# Patient Record
Sex: Male | Born: 1950
Health system: Southern US, Community
[De-identification: ages and names within clinical notes are randomized; demographics above are authoritative.]

## PROBLEM LIST (undated history)

## (undated) DIAGNOSIS — B1920 Unspecified viral hepatitis C without hepatic coma: Secondary | ICD-10-CM

## (undated) DIAGNOSIS — I639 Cerebral infarction, unspecified: Secondary | ICD-10-CM

## (undated) DIAGNOSIS — K259 Gastric ulcer, unspecified as acute or chronic, without hemorrhage or perforation: Secondary | ICD-10-CM

## (undated) DIAGNOSIS — Z992 Dependence on renal dialysis: Secondary | ICD-10-CM

## (undated) DIAGNOSIS — I5042 Chronic combined systolic (congestive) and diastolic (congestive) heart failure: Secondary | ICD-10-CM

## (undated) DIAGNOSIS — K8020 Calculus of gallbladder without cholecystitis without obstruction: Secondary | ICD-10-CM

## (undated) DIAGNOSIS — K219 Gastro-esophageal reflux disease without esophagitis: Secondary | ICD-10-CM

## (undated) DIAGNOSIS — F528 Other sexual dysfunction not due to a substance or known physiological condition: Secondary | ICD-10-CM

## (undated) DIAGNOSIS — F319 Bipolar disorder, unspecified: Secondary | ICD-10-CM

## (undated) DIAGNOSIS — I447 Left bundle-branch block, unspecified: Secondary | ICD-10-CM

## (undated) DIAGNOSIS — D649 Anemia, unspecified: Secondary | ICD-10-CM

## (undated) DIAGNOSIS — I480 Paroxysmal atrial fibrillation: Secondary | ICD-10-CM

## (undated) DIAGNOSIS — N186 End stage renal disease: Secondary | ICD-10-CM

## (undated) DIAGNOSIS — E119 Type 2 diabetes mellitus without complications: Secondary | ICD-10-CM

## (undated) DIAGNOSIS — F3289 Other specified depressive episodes: Secondary | ICD-10-CM

## (undated) DIAGNOSIS — F329 Major depressive disorder, single episode, unspecified: Secondary | ICD-10-CM

## (undated) DIAGNOSIS — T8859XA Other complications of anesthesia, initial encounter: Secondary | ICD-10-CM

## (undated) DIAGNOSIS — K922 Gastrointestinal hemorrhage, unspecified: Secondary | ICD-10-CM

## (undated) DIAGNOSIS — Z87442 Personal history of urinary calculi: Secondary | ICD-10-CM

## (undated) DIAGNOSIS — T4145XA Adverse effect of unspecified anesthetic, initial encounter: Secondary | ICD-10-CM

## (undated) DIAGNOSIS — I10 Essential (primary) hypertension: Secondary | ICD-10-CM

## (undated) DIAGNOSIS — N400 Enlarged prostate without lower urinary tract symptoms: Secondary | ICD-10-CM

## (undated) DIAGNOSIS — IMO0001 Reserved for inherently not codable concepts without codable children: Secondary | ICD-10-CM

## (undated) HISTORY — PX: EYE SURGERY: SHX253

## (undated) HISTORY — DX: Morbid (severe) obesity due to excess calories: E66.01

## (undated) HISTORY — DX: Other specified depressive episodes: F32.89

## (undated) HISTORY — DX: Essential (primary) hypertension: I10

## (undated) HISTORY — DX: Benign prostatic hyperplasia without lower urinary tract symptoms: N40.0

## (undated) HISTORY — DX: Anemia, unspecified: D64.9

## (undated) HISTORY — DX: Paroxysmal atrial fibrillation: I48.0

## (undated) HISTORY — DX: Gastric ulcer, unspecified as acute or chronic, without hemorrhage or perforation: K25.9

## (undated) HISTORY — DX: Type 2 diabetes mellitus without complications: E11.9

## (undated) HISTORY — DX: Other sexual dysfunction not due to a substance or known physiological condition: F52.8

## (undated) HISTORY — DX: Chronic combined systolic (congestive) and diastolic (congestive) heart failure: I50.42

## (undated) HISTORY — DX: Calculus of gallbladder without cholecystitis without obstruction: K80.20

## (undated) HISTORY — DX: Gastro-esophageal reflux disease without esophagitis: K21.9

## (undated) HISTORY — DX: Left bundle-branch block, unspecified: I44.7

## (undated) HISTORY — DX: Major depressive disorder, single episode, unspecified: F32.9

## (undated) HISTORY — DX: Reserved for inherently not codable concepts without codable children: IMO0001

---

## 1998-01-15 ENCOUNTER — Encounter: Admission: RE | Admit: 1998-01-15 | Discharge: 1998-04-15 | Payer: Self-pay | Admitting: Anesthesiology

## 1998-01-17 ENCOUNTER — Ambulatory Visit (HOSPITAL_COMMUNITY): Admission: RE | Admit: 1998-01-17 | Discharge: 1998-01-17 | Payer: Self-pay | Admitting: Gastroenterology

## 1998-04-26 ENCOUNTER — Encounter: Admission: RE | Admit: 1998-04-26 | Discharge: 1998-07-25 | Payer: Self-pay | Admitting: Anesthesiology

## 1998-09-25 ENCOUNTER — Encounter: Admission: RE | Admit: 1998-09-25 | Discharge: 1998-12-13 | Payer: Self-pay | Admitting: Anesthesiology

## 1998-12-13 ENCOUNTER — Encounter: Admission: RE | Admit: 1998-12-13 | Discharge: 1999-02-24 | Payer: Self-pay | Admitting: Anesthesiology

## 1999-02-25 ENCOUNTER — Encounter: Admission: RE | Admit: 1999-02-25 | Discharge: 1999-05-26 | Payer: Self-pay | Admitting: Anesthesiology

## 2001-11-30 ENCOUNTER — Ambulatory Visit: Admission: RE | Admit: 2001-11-30 | Discharge: 2001-11-30 | Payer: Self-pay | Admitting: Internal Medicine

## 2006-03-05 ENCOUNTER — Ambulatory Visit: Payer: Self-pay | Admitting: Internal Medicine

## 2006-04-14 ENCOUNTER — Ambulatory Visit: Payer: Self-pay | Admitting: Internal Medicine

## 2006-04-15 ENCOUNTER — Ambulatory Visit (HOSPITAL_COMMUNITY): Admission: RE | Admit: 2006-04-15 | Discharge: 2006-04-15 | Payer: Self-pay | Admitting: Internal Medicine

## 2006-04-15 ENCOUNTER — Ambulatory Visit: Payer: Self-pay | Admitting: Internal Medicine

## 2006-04-16 ENCOUNTER — Ambulatory Visit: Payer: Self-pay | Admitting: Internal Medicine

## 2006-04-23 ENCOUNTER — Ambulatory Visit (HOSPITAL_COMMUNITY): Admission: RE | Admit: 2006-04-23 | Discharge: 2006-04-23 | Payer: Self-pay | Admitting: Internal Medicine

## 2006-06-10 ENCOUNTER — Ambulatory Visit: Payer: Self-pay | Admitting: Internal Medicine

## 2007-03-25 ENCOUNTER — Encounter: Payer: Self-pay | Admitting: Internal Medicine

## 2007-03-25 DIAGNOSIS — I110 Hypertensive heart disease with heart failure: Secondary | ICD-10-CM | POA: Insufficient documentation

## 2007-03-25 DIAGNOSIS — F528 Other sexual dysfunction not due to a substance or known physiological condition: Secondary | ICD-10-CM

## 2007-03-25 DIAGNOSIS — E119 Type 2 diabetes mellitus without complications: Secondary | ICD-10-CM

## 2007-03-25 DIAGNOSIS — B182 Chronic viral hepatitis C: Secondary | ICD-10-CM

## 2007-03-25 DIAGNOSIS — K219 Gastro-esophageal reflux disease without esophagitis: Secondary | ICD-10-CM | POA: Insufficient documentation

## 2007-05-05 ENCOUNTER — Encounter: Payer: Self-pay | Admitting: Internal Medicine

## 2007-05-05 ENCOUNTER — Ambulatory Visit: Payer: Self-pay | Admitting: Internal Medicine

## 2007-05-05 DIAGNOSIS — J309 Allergic rhinitis, unspecified: Secondary | ICD-10-CM | POA: Insufficient documentation

## 2007-05-05 DIAGNOSIS — R1084 Generalized abdominal pain: Secondary | ICD-10-CM

## 2007-05-05 LAB — CONVERTED CEMR LAB
ALT: 41 units/L (ref 0–53)
Amylase: 97 units/L (ref 27–131)
BUN: 18 mg/dL (ref 6–23)
Bilirubin, Direct: 0.3 mg/dL (ref 0.0–0.3)
Calcium: 9.4 mg/dL (ref 8.4–10.5)
Eosinophils Absolute: 0 10*3/uL (ref 0.0–0.6)
Eosinophils Relative: 0.7 % (ref 0.0–5.0)
GFR calc Af Amer: 58 mL/min
GFR calc non Af Amer: 48 mL/min
Glucose, Bld: 260 mg/dL — ABNORMAL HIGH (ref 70–99)
H Pylori IgG: NEGATIVE
Hgb A1c MFr Bld: 8.6 % — ABNORMAL HIGH (ref 4.6–6.0)
Lipase: 20 units/L (ref 11.0–59.0)
Lymphocytes Relative: 24.1 % (ref 12.0–46.0)
MCV: 86.9 fL (ref 78.0–100.0)
Monocytes Relative: 6.6 % (ref 3.0–11.0)
Neutro Abs: 3.6 10*3/uL (ref 1.4–7.7)
Platelets: 197 10*3/uL (ref 150–400)
Potassium: 4.9 meq/L (ref 3.5–5.1)

## 2007-05-13 ENCOUNTER — Encounter: Admission: RE | Admit: 2007-05-13 | Discharge: 2007-05-13 | Payer: Self-pay | Admitting: Internal Medicine

## 2007-07-28 DIAGNOSIS — I639 Cerebral infarction, unspecified: Secondary | ICD-10-CM

## 2007-07-28 HISTORY — DX: Cerebral infarction, unspecified: I63.9

## 2007-08-23 ENCOUNTER — Inpatient Hospital Stay (HOSPITAL_COMMUNITY): Admission: EM | Admit: 2007-08-23 | Discharge: 2007-09-05 | Payer: Self-pay | Admitting: Emergency Medicine

## 2007-08-23 ENCOUNTER — Ambulatory Visit: Payer: Self-pay | Admitting: Cardiovascular Disease

## 2007-08-23 ENCOUNTER — Ambulatory Visit: Payer: Self-pay | Admitting: Internal Medicine

## 2007-08-23 ENCOUNTER — Encounter: Payer: Self-pay | Admitting: Emergency Medicine

## 2007-08-25 ENCOUNTER — Ambulatory Visit: Payer: Self-pay | Admitting: Cardiovascular Disease

## 2007-08-25 ENCOUNTER — Encounter: Payer: Self-pay | Admitting: Internal Medicine

## 2007-08-29 ENCOUNTER — Ambulatory Visit: Payer: Self-pay | Admitting: Physical Medicine & Rehabilitation

## 2007-09-05 ENCOUNTER — Ambulatory Visit: Payer: Self-pay | Admitting: Physical Medicine & Rehabilitation

## 2007-09-05 ENCOUNTER — Inpatient Hospital Stay (HOSPITAL_COMMUNITY)
Admission: RE | Admit: 2007-09-05 | Discharge: 2007-09-28 | Payer: Self-pay | Admitting: Physical Medicine & Rehabilitation

## 2007-09-08 ENCOUNTER — Telehealth (INDEPENDENT_AMBULATORY_CARE_PROVIDER_SITE_OTHER): Payer: Self-pay | Admitting: *Deleted

## 2007-10-17 ENCOUNTER — Encounter
Admission: RE | Admit: 2007-10-17 | Discharge: 2008-01-15 | Payer: Self-pay | Admitting: Physical Medicine & Rehabilitation

## 2007-11-23 ENCOUNTER — Ambulatory Visit: Payer: Self-pay | Admitting: Physical Medicine & Rehabilitation

## 2007-12-06 ENCOUNTER — Encounter
Admission: RE | Admit: 2007-12-06 | Discharge: 2008-03-08 | Payer: Self-pay | Admitting: Physical Medicine & Rehabilitation

## 2008-02-14 ENCOUNTER — Encounter
Admission: RE | Admit: 2008-02-14 | Discharge: 2008-03-07 | Payer: Self-pay | Admitting: Physical Medicine & Rehabilitation

## 2009-03-15 ENCOUNTER — Telehealth: Payer: Self-pay | Admitting: Internal Medicine

## 2009-03-18 ENCOUNTER — Ambulatory Visit: Payer: Self-pay | Admitting: Internal Medicine

## 2009-03-18 DIAGNOSIS — Z87442 Personal history of urinary calculi: Secondary | ICD-10-CM | POA: Insufficient documentation

## 2009-03-18 DIAGNOSIS — I5041 Acute combined systolic (congestive) and diastolic (congestive) heart failure: Secondary | ICD-10-CM

## 2009-03-18 DIAGNOSIS — F329 Major depressive disorder, single episode, unspecified: Secondary | ICD-10-CM

## 2009-03-19 ENCOUNTER — Telehealth: Payer: Self-pay | Admitting: Internal Medicine

## 2009-03-20 LAB — CONVERTED CEMR LAB
BUN: 43 mg/dL — ABNORMAL HIGH (ref 6–23)
Calcium: 8.9 mg/dL (ref 8.4–10.5)
Creatinine, Ser: 1.9 mg/dL — ABNORMAL HIGH (ref 0.4–1.5)
GFR calc non Af Amer: 47.13 mL/min (ref >60)
Hgb A1c MFr Bld: 9.2 % — ABNORMAL HIGH (ref 4.6–6.5)
Potassium: 4.6 meq/L (ref 3.5–5.1)

## 2009-03-21 ENCOUNTER — Telehealth: Payer: Self-pay | Admitting: Internal Medicine

## 2009-03-27 ENCOUNTER — Telehealth: Payer: Self-pay | Admitting: Internal Medicine

## 2009-08-06 ENCOUNTER — Ambulatory Visit: Payer: Self-pay | Admitting: Internal Medicine

## 2009-08-06 DIAGNOSIS — N259 Disorder resulting from impaired renal tubular function, unspecified: Secondary | ICD-10-CM | POA: Insufficient documentation

## 2009-08-06 DIAGNOSIS — Z8679 Personal history of other diseases of the circulatory system: Secondary | ICD-10-CM

## 2009-08-06 DIAGNOSIS — M545 Low back pain: Secondary | ICD-10-CM

## 2009-08-07 LAB — CONVERTED CEMR LAB
ALT: 54 units/L — ABNORMAL HIGH (ref 0–53)
BUN: 24 mg/dL — ABNORMAL HIGH (ref 6–23)
Bilirubin, Direct: 0.1 mg/dL (ref 0.0–0.3)
CO2: 27 meq/L (ref 19–32)
Chloride: 101 meq/L (ref 96–112)
Cholesterol: 176 mg/dL (ref 0–200)
Creatinine, Ser: 1.8 mg/dL — ABNORMAL HIGH (ref 0.4–1.5)
Eosinophils Absolute: 0.1 10*3/uL (ref 0.0–0.7)
Glucose, Bld: 288 mg/dL — ABNORMAL HIGH (ref 70–99)
HCT: 41 % (ref 39.0–52.0)
HDL: 48.4 mg/dL (ref >39.00)
Hemoglobin, Urine: NEGATIVE
LDL Cholesterol: 105 mg/dL — ABNORMAL HIGH (ref 0–99)
Lymphs Abs: 1.7 10*3/uL (ref 0.7–4.0)
MCHC: 31.7 g/dL (ref 30.0–36.0)
MCV: 88.3 fL (ref 78.0–100.0)
Monocytes Absolute: 0.5 10*3/uL (ref 0.1–1.0)
Neutrophils Relative %: 52.2 % (ref 43.0–77.0)
Nitrite: NEGATIVE
Platelets: 270 10*3/uL (ref 150.0–400.0)
Potassium: 4.5 meq/L (ref 3.5–5.1)
Total Bilirubin: 0.6 mg/dL (ref 0.3–1.2)
Total CHOL/HDL Ratio: 4
Total Protein, Urine: 100 mg/dL
Total Protein: 7.3 g/dL (ref 6.0–8.3)
Triglycerides: 115 mg/dL (ref 0.0–149.0)
Urine Glucose: 100 mg/dL
pH: 6 (ref 5.0–8.0)

## 2009-09-18 ENCOUNTER — Ambulatory Visit: Payer: Self-pay | Admitting: Internal Medicine

## 2009-09-18 DIAGNOSIS — M25519 Pain in unspecified shoulder: Secondary | ICD-10-CM | POA: Insufficient documentation

## 2009-09-20 LAB — CONVERTED CEMR LAB
Calcium: 8.9 mg/dL (ref 8.4–10.5)
Cholesterol: 170 mg/dL (ref 0–200)
GFR calc non Af Amer: 47.05 mL/min (ref >60)
HDL: 49.3 mg/dL (ref >39.00)
Hgb A1c MFr Bld: 7.9 % — ABNORMAL HIGH (ref 4.6–6.5)
Potassium: 4.1 meq/L (ref 3.5–5.1)
Sodium: 137 meq/L (ref 135–145)
Triglycerides: 82 mg/dL (ref 0.0–149.0)
VLDL: 16.4 mg/dL (ref 0.0–40.0)

## 2009-11-06 ENCOUNTER — Ambulatory Visit: Payer: Self-pay | Admitting: Internal Medicine

## 2009-11-06 DIAGNOSIS — N189 Chronic kidney disease, unspecified: Secondary | ICD-10-CM

## 2009-11-06 DIAGNOSIS — R109 Unspecified abdominal pain: Secondary | ICD-10-CM

## 2009-11-06 DIAGNOSIS — R10819 Abdominal tenderness, unspecified site: Secondary | ICD-10-CM

## 2009-11-07 LAB — CONVERTED CEMR LAB
ALT: 100 units/L — ABNORMAL HIGH (ref 0–53)
AST: 69 units/L — ABNORMAL HIGH (ref 0–37)
Alkaline Phosphatase: 79 units/L (ref 39–117)
BUN: 22 mg/dL (ref 6–23)
Basophils Relative: 0.8 % (ref 0.0–3.0)
Bilirubin, Direct: 0.1 mg/dL (ref 0.0–0.3)
Calcium: 8.8 mg/dL (ref 8.4–10.5)
Eosinophils Absolute: 0.1 10*3/uL (ref 0.0–0.7)
GFR calc non Af Amer: 53.47 mL/min (ref >60)
HDL: 45.9 mg/dL (ref >39.00)
Hemoglobin, Urine: NEGATIVE
Hgb A1c MFr Bld: 9.4 % — ABNORMAL HIGH (ref 4.6–6.5)
Lipase: 17 units/L (ref 11.0–59.0)
Lymphs Abs: 2 10*3/uL (ref 0.7–4.0)
MCHC: 33.5 g/dL (ref 30.0–36.0)
MCV: 84.5 fL (ref 78.0–100.0)
Monocytes Absolute: 0.5 10*3/uL (ref 0.1–1.0)
Neutrophils Relative %: 55.5 % (ref 43.0–77.0)
Nitrite: NEGATIVE
Platelets: 251 10*3/uL (ref 150.0–400.0)
Potassium: 4 meq/L (ref 3.5–5.1)
RBC: 5.1 M/uL (ref 4.22–5.81)
Specific Gravity, Urine: >=1.03 (ref 1.000–1.030)
Total Bilirubin: 0.3 mg/dL (ref 0.3–1.2)
Total Protein, Urine: >=300 mg/dL
Urobilinogen, UA: 0.2 (ref 0.0–1.0)
pH: 5 (ref 5.0–8.0)

## 2009-11-08 ENCOUNTER — Encounter: Admission: RE | Admit: 2009-11-08 | Discharge: 2009-11-08 | Payer: Self-pay | Admitting: Internal Medicine

## 2009-11-08 ENCOUNTER — Ambulatory Visit: Payer: Self-pay | Admitting: Internal Medicine

## 2009-11-08 LAB — CONVERTED CEMR LAB
HCV Ab: REACTIVE — AB
Hep A IgM: NEGATIVE

## 2010-04-01 ENCOUNTER — Encounter: Payer: Self-pay | Admitting: Internal Medicine

## 2010-04-23 ENCOUNTER — Telehealth: Payer: Self-pay | Admitting: Internal Medicine

## 2010-04-23 ENCOUNTER — Ambulatory Visit: Payer: Self-pay | Admitting: Internal Medicine

## 2010-04-23 DIAGNOSIS — M79609 Pain in unspecified limb: Secondary | ICD-10-CM | POA: Insufficient documentation

## 2010-04-23 DIAGNOSIS — R5383 Other fatigue: Secondary | ICD-10-CM

## 2010-04-23 DIAGNOSIS — R5381 Other malaise: Secondary | ICD-10-CM | POA: Insufficient documentation

## 2010-04-23 DIAGNOSIS — R935 Abnormal findings on diagnostic imaging of other abdominal regions, including retroperitoneum: Secondary | ICD-10-CM | POA: Insufficient documentation

## 2010-04-23 LAB — CONVERTED CEMR LAB
ALT: 39 units/L (ref 0–53)
Albumin: 3.1 g/dL — ABNORMAL LOW (ref 3.5–5.2)
Basophils Absolute: 0 10*3/uL (ref 0.0–0.1)
Basophils Relative: 0.5 % (ref 0.0–3.0)
CO2: 26 meq/L (ref 19–32)
Chloride: 103 meq/L (ref 96–112)
Cholesterol: 178 mg/dL (ref 0–200)
Eosinophils Absolute: 0.1 10*3/uL (ref 0.0–0.7)
HDL: 42 mg/dL (ref >39.00)
Hemoglobin: 13.4 g/dL (ref 13.0–17.0)
LDL Cholesterol: 114 mg/dL — ABNORMAL HIGH (ref 0–99)
Lymphocytes Relative: 29.8 % (ref 12.0–46.0)
MCHC: 33.4 g/dL (ref 30.0–36.0)
Monocytes Relative: 8.1 % (ref 3.0–12.0)
Neutro Abs: 3.6 10*3/uL (ref 1.4–7.7)
Neutrophils Relative %: 59.6 % (ref 43.0–77.0)
Potassium: 5 meq/L (ref 3.5–5.1)
RBC: 4.66 M/uL (ref 4.22–5.81)
RDW: 13.1 % (ref 11.5–14.6)
Sodium: 136 meq/L (ref 135–145)
Total CHOL/HDL Ratio: 4
Total Protein: 7.2 g/dL (ref 6.0–8.3)
Triglycerides: 108 mg/dL (ref 0.0–149.0)

## 2010-04-24 ENCOUNTER — Ambulatory Visit: Payer: Self-pay | Admitting: Internal Medicine

## 2010-04-29 ENCOUNTER — Encounter: Payer: Self-pay | Admitting: Internal Medicine

## 2010-05-02 ENCOUNTER — Telehealth: Payer: Self-pay | Admitting: Internal Medicine

## 2010-05-29 ENCOUNTER — Encounter: Payer: Self-pay | Admitting: Internal Medicine

## 2010-06-27 ENCOUNTER — Encounter: Payer: Self-pay | Admitting: Internal Medicine

## 2010-07-31 ENCOUNTER — Telehealth: Payer: Self-pay | Admitting: Internal Medicine

## 2010-08-01 ENCOUNTER — Ambulatory Visit
Admission: RE | Admit: 2010-08-01 | Discharge: 2010-08-01 | Payer: Self-pay | Source: Home / Self Care | Attending: Internal Medicine | Admitting: Internal Medicine

## 2010-08-01 ENCOUNTER — Other Ambulatory Visit: Payer: Self-pay | Admitting: Internal Medicine

## 2010-08-01 ENCOUNTER — Encounter: Payer: Self-pay | Admitting: Internal Medicine

## 2010-08-01 DIAGNOSIS — R609 Edema, unspecified: Secondary | ICD-10-CM | POA: Insufficient documentation

## 2010-08-01 DIAGNOSIS — N400 Enlarged prostate without lower urinary tract symptoms: Secondary | ICD-10-CM | POA: Insufficient documentation

## 2010-08-01 DIAGNOSIS — K8020 Calculus of gallbladder without cholecystitis without obstruction: Secondary | ICD-10-CM | POA: Insufficient documentation

## 2010-08-01 LAB — LIPID PANEL
Cholesterol: 160 mg/dL (ref 0–200)
HDL: 35.3 mg/dL — ABNORMAL LOW (ref >39.00)
LDL Cholesterol: 106 mg/dL — ABNORMAL HIGH (ref 0–99)
Total CHOL/HDL Ratio: 5
Triglycerides: 92 mg/dL (ref 0.0–149.0)
VLDL: 18.4 mg/dL (ref 0.0–40.0)

## 2010-08-01 LAB — BASIC METABOLIC PANEL
BUN: 38 mg/dL — ABNORMAL HIGH (ref 6–23)
CO2: 26 mEq/L (ref 19–32)
Calcium: 8.8 mg/dL (ref 8.4–10.5)
Chloride: 105 mEq/L (ref 96–112)
Creatinine, Ser: 3 mg/dL — ABNORMAL HIGH (ref 0.4–1.5)
GFR: 28.23 mL/min — ABNORMAL LOW (ref >60.00)
Glucose, Bld: 257 mg/dL — ABNORMAL HIGH (ref 70–99)
Potassium: 4.5 mEq/L (ref 3.5–5.1)
Sodium: 138 mEq/L (ref 135–145)

## 2010-08-01 LAB — HEMOGLOBIN A1C: Hgb A1c MFr Bld: 8.5 % — ABNORMAL HIGH (ref 4.6–6.5)

## 2010-08-15 ENCOUNTER — Encounter: Payer: Self-pay | Admitting: Internal Medicine

## 2010-08-28 NOTE — Progress Notes (Signed)
Summary: pt decline appt for referal   Phone Note From Other Clinic   Caller: Appointment Secretary lynn  Call For: Dr Jonny Ruiz Details for Reason: FYI  Summary of Call: Per Larita Fife at [facility] called pt to today to schedule Appt -pt decline appt  Initial call taken by: Shelbie Proctor,  May 02, 2010 4:52 PM  Follow-up for Phone Call        noted Follow-up by: Corwin Levins MD,  May 02, 2010 5:26 PM

## 2010-08-28 NOTE — Consult Note (Signed)
Summary: [facility]   Imported By: Lester [place] 06/03/2010 07:37:07  _____________________________________________________________________  External Attachment:    Type:   Image     Comment:   External Document

## 2010-08-28 NOTE — Assessment & Plan Note (Signed)
Summary: 6 mos f/u # / cd   Vital Signs:  Patient profile:   60 year old male Height:      69 inches Weight:      251 pounds BMI:     37.20 O2 Sat:      97 % on Room air Temp:     98.9 degrees F oral Pulse rate:   75 / minute BP sitting:   100 / 70  (left arm) Cuff size:   large  Vitals Entered By: Zella Ball Ewing CMA (AAMA) (April 23, 2010 9:45 AM)  O2 Flow:  Room air  CC: 6 month ROV/RE   CC:  6 month ROV/RE.  History of Present Illness: here to f/u, overall doingok;  Pt denies CP, worsening sob, doe, wheezing, orthopnea, pnd, worsening LE edema, palps, dizziness or syncope  Pt denies new neuro symptoms such as headache, facial or extremity weakness  Pt denies polydipsia, polyuria, or low sugar symptoms such as shakiness improved with eating.  Overall good compliance with meds, trying to follow low chol, DM diet, wt stable, little excercise however  No fever, wt loss, night sweats, loss of appetite or other constitutional symptoms  Did  radiological study with suggestion of mild bladder thickening.  Also pt c/o tender swollen area to DIP or left thumb , worse in the past 2 mo, bigger and smaller.  Also with an obvious general  weakness that occurs each am after taking his BP meds.  Problems Prior to Update: 1)  Thumb Pain, Right  (ICD-729.5) 2)  Abdominal Ultrasound, Abnormal  (ICD-793.6) 3)  Weakness  (ICD-780.79) 4)  Chronic Kidney Disease Unspecified  (ICD-585.9) 5)  Abdominal Tenderness  (ICD-789.60) 6)  Flank Pain, Right  (ICD-789.09) 7)  Shoulder Pain, Right  (ICD-719.41) 8)  Back Pain, Lumbar, Chronic  (ICD-724.2) 9)  Renal Insufficiency  (ICD-588.9) 10)  Cerebrovascular Accident, Hx of  (ICD-V12.50) 11)  Preventive Health Care  (ICD-V70.0) 12)  Congestive Heart Failure  (ICD-428.0) 13)  Nephrolithiasis, Hx of  (ICD-V13.01) 14)  Depression  (ICD-311) 15)  Abdominal Pain, Generalized  (ICD-789.07) 16)  Allergic Rhinitis  (ICD-477.9) 17)  Morbid Obesity   (ICD-278.01) 18)  Partial Right Nephrectomy, Hx of  (ICD-V45.73) 19)  Hypertension  (ICD-401.9) 20)  Gerd  (ICD-530.81) 21)  Diabetes Mellitus, Type II  (ICD-250.00) 22)  Hepatitis C, Hx of  (ICD-V12.09) 23)  Erectile Dysfunction  (ICD-302.72)  Medications Prior to Update: 1)  Glucotrol 10 Mg  Tabs (Glipizide) .[name].. 1 By Mouth Two Times A Day 2)  Adult Aspirin Low Strength 81 Mg  Tbdp (Aspirin) .[name].. 1 By Mouth Qd 3)  Labetalol Hcl 300 Mg Tabs (Labetalol Hcl) .... Take 1 Po  Two Times A Day 4)  Amlodipine Besylate 10 Mg Tabs (Amlodipine Besylate) .... Take 1 By Mouth Once Daily 5)  Hydralazine Hcl 100 Mg Tabs (Hydralazine Hcl) .... Take 1 By Mouth Three Times A Day 6)  Furosemide 20 Mg Tabs (Furosemide) .... Take 1 By Mouth Once Daily 7)  Catapres-Tts-2 0.2 Mg/24hr Ptwk (Clonidine Hcl) .... Apply 1 Patch 1 Q Week 8)  Lisinopril 40 Mg Tabs (Lisinopril) .[name].. 1 By Mouth Once Daily 9)  Tramadol Hcl 50 Mg Tabs (Tramadol Hcl) .[name].. 1po Four Times Per Day As Needed For Pain 10)  Actos 30 Mg Tabs (Pioglitazone Hcl) .[name].. 1po Once Daily 11)  Freestyle Lite Test  Strp (Glucose Blood) .... Use Asd 1 Once Daily 12)  Lancets  Misc (Lancets) .... Use Asd  1 Once Daily 13)  Ciprofloxacin Hcl 500 Mg Tabs (Ciprofloxacin Hcl) .[name].. 1po Two Times A Day 14)  Pravachol 20 Mg Tabs (Pravastatin Sodium) .[name].. 1po Once Daily 15)  Miralax  Powd (Polyethylene Glycol 3350) .Marland KitchenMarland KitchenMarland Kitchen 17 Gm in Water Once Daily  Current Medications (verified): 1)  Glucotrol 10 Mg  Tabs (Glipizide) .[name].. 1 By Mouth Two Times A Day 2)  Adult Aspirin Low Strength 81 Mg  Tbdp (Aspirin) .[name].. 1 By Mouth Qd 3)  Labetalol Hcl 300 Mg Tabs (Labetalol Hcl) .... Take 1 Po  Two Times A Day 4)  Amlodipine Besylate 10 Mg Tabs (Amlodipine Besylate) .... Take 1 By Mouth Once Daily 5)  Hydralazine Hcl 100 Mg Tabs (Hydralazine Hcl) .... Take 1 By Mouth Three Times A Day 6)  Furosemide 20 Mg Tabs (Furosemide) .... Take 1 By Mouth Once Daily 7)  Lisinopril 20 Mg  Tabs (Lisinopril) .[name].. 1 By Mouth Once Daily 8)  Tramadol Hcl 50 Mg Tabs (Tramadol Hcl) .[name].. 1po Four Times Per Day As Needed For Pain 9)  Actos 30 Mg Tabs (Pioglitazone Hcl) .[name].. 1po Once Daily 10)  Freestyle Lite Test  Strp (Glucose Blood) .... Use Asd 1 Once Daily 11)  Lancets  Misc (Lancets) .... Use Asd 1 Once Daily 12)  Pravachol 20 Mg Tabs (Pravastatin Sodium) .[name].. 1po Once Daily 13)  Miralax  Powd (Polyethylene Glycol 3350) .Marland KitchenMarland KitchenMarland Kitchen 17 Gm in Water Once Daily 14)  Clonidine Hcl 0.1 Mg Tabs (Clonidine Hcl) .[name].. 1 By Mouth Two Times A Day  Allergies (verified): No Known Drug Allergies  Past History:  Past Medical History: Last updated: 08/06/2009 Diabetes mellitus, type II GERD Hypertension Erectile Dysfunction Hx of Hepatitis C Morbid Obesity Allergic rhinitis Depression likely lumbar stenosis with radiculopathy/chronic LLE pain medical noncompliance right lacunar CVA 3/09 - Dr Hickling/neuro Nephrolithiasis, hx of Congestive heart failure - chronic systolic, EF 40%, Jan 2009 Renal insufficiency  Past Surgical History: Last updated: 03/25/2007 Nephrectomy, partial RR  Social History: Last updated: 08/06/2009 Never Smoked Alcohol use-no married (second time) lives alone no local family 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   Head:  normocephalic and atraumatic.   Eyes:  vision grossly intact, pupils equal, and pupils round.   Ears:  R ear normal and L ear normal.   Nose:  no external deformity and no nasal discharge.   Mouth:  no gingival abnormalities and pharynx pink and moist.   Neck:  supple and no masses.   Lungs:  normal respiratory effort and normal breath sounds.   Heart:  normal rate and regular rhythm.   Msk:  right thumb DIP  with approx 8 mm cystic mass like swelling Extremities:  no edema, no erythema    Impression & Recommendations:  Problem # 1:  WEAKNESS (ICD-780.79)  general after taking meds in the AM, after starting clonidine in additoin to usual meds (after referred per optho to urgent care) , now with low BP on current meds (also not sure of exact clonidine dose); suspect due to overcontrolled BP - to reduce the lisinopril to 20 mg per day;  also check routine labs  Orders: TLB-Hepatic/Liver Function Pnl (80076-HEPATIC) TLB-CBC Platelet - w/Differential (85025-CBCD) TLB-TSH (Thyroid Stimulating Hormone) (84443-TSH)  Problem #  2:  HYPERTENSION (ICD-401.9)  The following medications were removed from the medication list:    Catapres-tts-2 0.2 Mg/24hr Ptwk (Clonidine hcl) .[name]... Apply 1 patch 1 q week His updated medication list for this problem includes:    Labetalol Hcl 300 Mg Tabs (Labetalol hcl) .[name]... Take 1 po  two times a day    Amlodipine Besylate 10 Mg Tabs (Amlodipine besylate) .[name]... Take 1 by mouth once daily    Hydralazine Hcl 100 Mg Tabs (Hydralazine hcl) .[name]... Take 1 by mouth three times a day    Furosemide 20 Mg Tabs (Furosemide) .[name]... Take 1 by mouth once daily    Lisinopril 20 Mg Tabs (Lisinopril) .[name]... 1 by mouth once daily    Clonidine Hcl 0.1 Mg Tabs (Clonidine hcl) .[name]... 1 by mouth two times a day  BP today: 100/70 Prior BP: 130/84 (11/06/2009)  Labs Reviewed: K+: 4.0 (11/06/2009) Creat: : 1.7 (11/06/2009)   Chol: 186 (11/06/2009)   HDL: 45.90 (11/06/2009)   LDL: 114 (11/06/2009)   TG: 129.0 (11/06/2009) as above, treat as above, f/u any worsening signs or symptoms , labs reviewed - ACE decreaed as above  Problem # 3:  ABDOMINAL ULTRASOUND, ABNORMAL (ICD-793.6)  with suggestion of bladder wall thickening , recent cx neg, adn still with mild freq (though also on lasix) - to refer to urology  Orders: Urology Referral (Urology)  Problem # 4:  THUMB PAIN, RIGHT  (ICD-729.5)  with ? cystic mass to DIP  - for hand surgury referral  Orders: Orthopedic Surgeon Referral (Ortho Surgeon)  Problem # 5:  DIABETES MELLITUS, TYPE II (ICD-250.00)  His updated medication list for this problem includes:    Glucotrol 10 Mg Tabs (Glipizide) .[name]... 1 by mouth two times a day    Adult Aspirin Low Strength 81 Mg Tbdp (Aspirin) .[name]... 1 by mouth qd    Lisinopril 20 Mg Tabs (Lisinopril) .[name]... 1 by mouth once daily    Actos 30 Mg Tabs (Pioglitazone hcl) .[name]... 1po once daily  Labs Reviewed: Creat: 1.7 (11/06/2009)    Reviewed HgBA1c results: 9.4 (11/06/2009)  7.9 (09/18/2009) stable overall by hx and exam, ok to continue meds/tx as is , Pt to cont DM diet, excercise, wt loss efforts; to check labs today   Orders: TLB-BMP (Basic Metabolic Panel-BMET) (80048-METABOL) TLB-Lipid Panel (80061-LIPID) TLB-A1C / Hgb A1C (Glycohemoglobin) (83036-A1C)  Complete Medication List: 1)  Glucotrol 10 Mg Tabs (Glipizide) .[name].. 1 by mouth two times a day 2)  Adult Aspirin Low Strength 81 Mg Tbdp (Aspirin) .[name].. 1 by mouth qd 3)  Labetalol Hcl 300 Mg Tabs (Labetalol hcl) .... Take 1 po  two times a day 4)  Amlodipine Besylate 10 Mg Tabs (Amlodipine besylate) .... Take 1 by mouth once daily 5)  Hydralazine Hcl 100 Mg Tabs (Hydralazine hcl) .... Take 1 by mouth three times a day 6)  Furosemide 20 Mg Tabs (Furosemide) .... Take 1 by mouth once daily 7)  Lisinopril 20 Mg Tabs (Lisinopril) .[name].. 1 by mouth once daily 8)  Tramadol Hcl 50 Mg Tabs (Tramadol hcl) .[name].. 1po four times per day as needed for pain 9)  Actos 30 Mg Tabs (Pioglitazone hcl) .[name].. 1po once daily 10)  Freestyle Lite Test Strp (Glucose blood) .... Use asd 1 once daily 11)  Lancets Misc (Lancets) .... Use asd 1 once daily 12)  Pravachol 20 Mg Tabs (Pravastatin sodium) .[name].. 1po once daily 13)  Miralax Powd (Polyethylene glycol 3350) .Marland KitchenMarland Kitchen. 17 gm in water once daily 14)  Clonidine Hcl 0.1 Mg Tabs (Clonidine hcl) .[name].. 1 by  mouth two times a day  Patient Instructions: 1)  decrease the lisinopril to 20 mg per day 2)  You will be contacted about the referral(s) to: Urology for the bladder, and Hand Surgury for the thumb 3)  Please go to the Lab in the basement for your blood and/or urine tests today  4)  Please schedule a follow-up appointment in Jan 2012 for your "yearly medicare exam" Prescriptions: CLONIDINE HCL 0.1 MG TABS (CLONIDINE HCL) 1 by mouth two times a day  #180 x 3   Entered and Authorized by:   Corwin Levins MD   Signed by:   Corwin Levins MD on 04/23/2010   Method used:   Electronically to        [facility] [id number] (retail)       [address] [place], [place]  [address]       Ph: [phone]       Fax: [phone]   RxID:   [id number] LISINOPRIL 20 MG TABS (LISINOPRIL) 1 by mouth once daily  #90 x 3   Entered and Authorized by:   Corwin Levins MD   Signed by:   Corwin Levins MD on 04/23/2010   Method used:   Print then Give to Patient   RxID:   [id number]

## 2010-08-28 NOTE — Letter (Signed)
Summary: [facility]  [facility]   Imported By: Lennie Odor 04/30/2010 15:15:09  _____________________________________________________________________  External Attachment:    Type:   Image     Comment:   External Document

## 2010-08-28 NOTE — Consult Note (Signed)
Summary: [facility]   Imported By: Sherian Rein 05/17/2010 08:51:22  _____________________________________________________________________  External Attachment:    Type:   Image     Comment:   External Document

## 2010-08-28 NOTE — Assessment & Plan Note (Signed)
Summary: PER PT R SIDE HURTING--D/T---STC   Vital Signs:  Patient profile:   60 year old male Height:      68 inches Weight:      239.50 pounds BMI:     36.55 O2 Sat:      98 % on Room air Temp:     99 degrees F oral Pulse rate:   76 / minute BP sitting:   130 / 84  (left arm) Cuff size:   large  Vitals Entered ByZella Ball Ewing (November 06, 2009 9:07 AM)  O2 Flow:  Room air  CC: Right side pain, discuss prescriptions/RE   CC:  Right side pain and discuss prescriptions/RE.  History of Present Illness: here with pain near the right flank and side, mild , acute on chronic recurrent , assoc with "lightening of the urine";  no frank blood;  but has had low grade temp, feeling mild ill,  no chills but has had some intermittent headaches; no n/v;  no left side or flank pain;  no dsuria, urgency but mild frequency , not sure if this was due to fluid pill , and drinking more canrberry jiuice and fliuids overall.  Has hx of UTI years ago, and hx of renal stone;  also known gallstone but no abd pain or swelling.  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  Does not think he is constipated, has daily BM though less volume lately.   Pt denies CP, sob, doe, wheezing, orthopnea, pnd, worsening LE edema, palps, dizziness or syncope   Pt denies new neuro symptoms such as headache, facial or extremity weakness Pt denies polydipsia, polyuria, or low sugar symptoms such as shakiness improved with eating.  Overall good compliance with meds, trying to follow low chol, DM diet, wt stable, little excercise however   Problems Prior to Update: 1)  Chronic Kidney Disease Unspecified  (ICD-585.9) 2)  Abdominal Tenderness  (ICD-789.60) 3)  Flank Pain, Right  (ICD-789.09) 4)  Shoulder Pain, Right  (ICD-719.41) 5)  Back Pain, Lumbar, Chronic  (ICD-724.2) 6)  Renal Insufficiency  (ICD-588.9) 7)  Cerebrovascular Accident, Hx  of  (ICD-V12.50) 8)  Preventive Health Care  (ICD-V70.0) 9)  Congestive Heart Failure  (ICD-428.0) 10)  Nephrolithiasis, Hx of  (ICD-V13.01) 11)  Depression  (ICD-311) 12)  Abdominal Pain, Generalized  (ICD-789.07) 13)  Allergic Rhinitis  (ICD-477.9) 14)  Morbid Obesity  (ICD-278.01) 15)  Partial Right Nephrectomy, Hx of  (ICD-V45.73) 16)  Hypertension  (ICD-401.9) 17)  Gerd  (ICD-530.81) 18)  Diabetes Mellitus, Type II  (ICD-250.00) 19)  Hepatitis C, Hx of  (ICD-V12.09) 20)  Erectile Dysfunction  (ICD-302.72)  Medications Prior to Update: 1)  Glucotrol 10 Mg  Tabs (Glipizide) .[name].. 1 By Mouth Two Times A Day 2)  Adult Aspirin Low Strength 81 Mg  Tbdp (Aspirin) .[name].. 1 By Mouth Qd 3)  Isosorbide Mononitrate Cr 120 Mg Xr24h-Tab (Isosorbide Mononitrate) .... Take 1 By Mouth Once Daily 4)  Labetalol Hcl 300 Mg Tabs (Labetalol Hcl) .... Take 1 Po  Two Times A Day 5)  Amlodipine Besylate 10 Mg Tabs (Amlodipine Besylate) .... Take 1 By Mouth Once Daily 6)  Hydralazine Hcl 100 Mg Tabs (Hydralazine Hcl) .... Take 1 By Mouth Three Times A Day 7)  Furosemide 20 Mg Tabs (Furosemide) .... Take 1 By Mouth Once Daily 8)  Catapres-Tts-2 0.2 Mg/24hr Ptwk (Clonidine Hcl) .Marland KitchenMarland KitchenMarland Kitchen  Apply 1 Patch 1 Q Week 9)  Lisinopril 40 Mg Tabs (Lisinopril) .[name].. 1 By Mouth Once Daily 10)  Tramadol Hcl 50 Mg Tabs (Tramadol Hcl) .[name].. 1po Four Times Per Day As Needed For Pain 11)  Actos 15 Mg Tabs (Pioglitazone Hcl) .[name].. 1po Once Daily 12)  Freestyle Lite Test  Strp (Glucose Blood) .... Use Asd 1 Once Daily 13)  Lancets  Misc (Lancets) .... Use Asd 1 Once Daily  Current Medications (verified): 1)  Glucotrol 10 Mg  Tabs (Glipizide) .[name].. 1 By Mouth Two Times A Day 2)  Adult Aspirin Low Strength 81 Mg  Tbdp (Aspirin) .[name].. 1 By Mouth Qd 3)  Labetalol Hcl 300 Mg Tabs (Labetalol Hcl) .... Take 1 Po  Two Times A Day 4)  Amlodipine Besylate 10 Mg Tabs (Amlodipine Besylate) .... Take 1 By Mouth Once Daily 5)  Hydralazine Hcl 100 Mg  Tabs (Hydralazine Hcl) .... Take 1 By Mouth Three Times A Day 6)  Furosemide 20 Mg Tabs (Furosemide) .... Take 1 By Mouth Once Daily 7)  Catapres-Tts-2 0.2 Mg/24hr Ptwk (Clonidine Hcl) .... Apply 1 Patch 1 Q Week 8)  Lisinopril 40 Mg Tabs (Lisinopril) .[name].. 1 By Mouth Once Daily 9)  Tramadol Hcl 50 Mg Tabs (Tramadol Hcl) .[name].. 1po Four Times Per Day As Needed For Pain 10)  Actos 15 Mg Tabs (Pioglitazone Hcl) .[name].. 1po Once Daily 11)  Freestyle Lite Test  Strp (Glucose Blood) .... Use Asd 1 Once Daily 12)  Lancets  Misc (Lancets) .... Use Asd 1 Once Daily 13)  Ciprofloxacin Hcl 500 Mg Tabs (Ciprofloxacin Hcl) .[name].. 1po Two Times A Day  Allergies (verified): No Known Drug Allergies  Past History:  Past Medical History: Last updated: 08/06/2009 Diabetes mellitus, type II GERD Hypertension Erectile Dysfunction Hx of Hepatitis C Morbid Obesity Allergic rhinitis Depression likely lumbar stenosis with radiculopathy/chronic LLE pain medical noncompliance right lacunar CVA 3/09 - Dr Hickling/neuro Nephrolithiasis, hx of Congestive heart failure - chronic systolic, EF 40%, Jan 2009 Renal insufficiency  Past Surgical History: Last updated: 03/25/2007 Nephrectomy, partial RR  Social History: Last updated: 08/06/2009 Never Smoked Alcohol use-no married (second time) lives alone no local family 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   Head:  normocephalic and atraumatic.   Eyes:  vision grossly intact, pupils equal, and pupils round.   Ears:  R ear normal and L ear normal.   Nose:  no external deformity and no nasal discharge.   Mouth:  no gingival abnormalities and pharynx pink and moist.   Neck:  supple and no masses.   Lungs:  normal respiratory effort and  normal breath sounds.   Heart:  normal rate and regular rhythm.   Abdomen:  mild to mod tender right mid abd and somewhat lower right quad, without guarding or rebound, o/w abd nontender, + BS and no organomegaly Msk:  mild right flank tender, no spine tender or sweling Extremities:  no edema, no erythema  Neurologic:  cranial nerves II-XII intact and strength normal in all extremities.     Impression & Recommendations:  Problem # 1:  FLANK PAIN, RIGHT (ICD-789.09)  His updated medication list for this problem includes:    Adult Aspirin Low Strength 81 Mg Tbdp (  Aspirin) .[name]... 1 by mouth qd    Tramadol Hcl 50 Mg Tabs (Tramadol hcl) .[name]... 1po four times per day as needed for pain ? recurrent stone - for CT urogram today  Orders: T-Culture, Urine ([id number]) Radiology Referral (Radiology) TLB-Udip w/ Micro (81001-URINE)  Problem # 2:  ABDOMINAL TENDERNESS (ICD-789.60)  low mid abd and right lower quad and right side/flank - ? pyelo vs GI - for urine studies,  empiric cipro, cant r/o diverticulitis or other but UTi/stone seem more liekly, or constipation  Orders: TLB-CBC Platelet - w/Differential (85025-CBCD) TLB-BMP (Basic Metabolic Panel-BMET) (80048-METABOL) TLB-Hepatic/Liver Function Pnl (80076-HEPATIC) TLB-Lipase (83690-LIPASE)  Problem # 3:  DIABETES MELLITUS, TYPE II (ICD-250.00)  His updated medication list for this problem includes:    Glucotrol 10 Mg Tabs (Glipizide) .[name]... 1 by mouth two times a day    Adult Aspirin Low Strength 81 Mg Tbdp (Aspirin) .[name]... 1 by mouth qd    Lisinopril 40 Mg Tabs (Lisinopril) .[name]... 1 by mouth once daily    Actos 15 Mg Tabs (Pioglitazone hcl) .[name]... 1po once daily  Orders: TLB-A1C / Hgb A1C (Glycohemoglobin) (83036-A1C) TLB-Lipid Panel (80061-LIPID)  Labs Reviewed: Creat: 1.9 (09/18/2009)    Reviewed HgBA1c results: 7.9 (09/18/2009)  7.2 (08/06/2009) stable overall by hx and exam, ok to continue meds/tx as is   Problem # 4:   CONGESTIVE HEART FAILURE (ICD-428.0)  His updated medication list for this problem includes:    Adult Aspirin Low Strength 81 Mg Tbdp (Aspirin) .[name]... 1 by mouth qd    Labetalol Hcl 300 Mg Tabs (Labetalol hcl) .[name]... Take 1 po  two times a day    Furosemide 20 Mg Tabs (Furosemide) .[name]... Take 1 by mouth once daily    Lisinopril 40 Mg Tabs (Lisinopril) .[name]... 1 by mouth once daily pt has stopped the isosorbide MN for one wk without CP , sob and refuses re-start due to nausea he thinks is from this;  will hold for now, o/w stable overall by hx and exam, ok to continue meds/tx as is   Complete Medication List: 1)  Glucotrol 10 Mg Tabs (Glipizide) .[name].. 1 by mouth two times a day 2)  Adult Aspirin Low Strength 81 Mg Tbdp (Aspirin) .[name].. 1 by mouth qd 3)  Labetalol Hcl 300 Mg Tabs (Labetalol hcl) .... Take 1 po  two times a day 4)  Amlodipine Besylate 10 Mg Tabs (Amlodipine besylate) .... Take 1 by mouth once daily 5)  Hydralazine Hcl 100 Mg Tabs (Hydralazine hcl) .... Take 1 by mouth three times a day 6)  Furosemide 20 Mg Tabs (Furosemide) .... Take 1 by mouth once daily 7)  Catapres-tts-2 0.2 Mg/24hr Ptwk (Clonidine hcl) .... Apply 1 patch 1 q week 8)  Lisinopril 40 Mg Tabs (Lisinopril) .[name].. 1 by mouth once daily 9)  Tramadol Hcl 50 Mg Tabs (Tramadol hcl) .[name].. 1po four times per day as needed for pain 10)  Actos 15 Mg Tabs (Pioglitazone hcl) .[name].. 1po once daily 11)  Freestyle Lite Test Strp (Glucose blood) .... Use asd 1 once daily 12)  Lancets Misc (Lancets) .... Use asd 1 once daily 13)  Ciprofloxacin Hcl 500 Mg Tabs (Ciprofloxacin hcl) .[name].. 1po two times a day  Patient Instructions: 1)  ok to stay off the isosorbid for now 2)  Please take all new medications as prescribed - the cipro antibiotic 3)  Please go to the Lab in the basement for your blood and/or urine tests today  4)  You will be  contacted about the referral(s) to: CT scan 5)  If you do have kidney stone with obstruciton, you will  be referred to urology for further evaluation and treatment 6)  Continue all previous medications as before this visit  - you are given the tramadol refill, and the actos 15 mg prescription 7)  Please schedule a follow-up appointment in 6 months with : 8)  BMP prior to visit, ICD-9: 250.02 9)  Lipid Panel prior to visit, ICD-9: 10)  HbgA1C prior to visit, ICD-9:    (or sooner if needed) Prescriptions: CIPROFLOXACIN HCL 500 MG TABS (CIPROFLOXACIN HCL) 1po two times a day  #20 x 0   Entered and Authorized by:   Corwin Levins MD   Signed by:   Corwin Levins MD on 11/06/2009   Method used:   Print then Give to Patient   RxID:   [id number] TRAMADOL HCL 50 MG TABS (TRAMADOL HCL) 1po four times per day as needed for pain  #120 x 5   Entered and Authorized by:   Corwin Levins MD   Signed by:   Corwin Levins MD on 11/06/2009   Method used:   Print then Give to Patient   RxID:   [id number] ACTOS 15 MG TABS (PIOGLITAZONE HCL) 1po once daily  #90 x 3   Entered and Authorized by:   Corwin Levins MD   Signed by:   Corwin Levins MD on 11/06/2009   Method used:   Print then Give to Patient   RxID:   [id number]

## 2010-08-28 NOTE — Assessment & Plan Note (Signed)
Summary: f/u appt regarding insulin/cd   Vital Signs:  Patient profile:   60 year old male Height:      69 inches Weight:      251.50 pounds BMI:     37.27 O2 Sat:      98 % on Room air Temp:     99.1 degrees F oral Pulse rate:   86 / minute BP sitting:   140 / 92  (left arm) Cuff size:   large  Vitals Entered By: Zella Ball Ewing CMA Duncan Dull) (April 24, 2010 10:46 AM)  O2 Flow:  Room air CC: Followup on lab results/RE   CC:  Followup on lab results/RE.  History of Present Illness: here to f/u with ? of need for insulin;  recent labs with marked elev hgba1c , and pt relates he has simply not been able to afford the mediations for DM prior to last visit, but did not want to mention this.  Has not been taking for several months, as he has been supporting his duaghter financially as she is not working and needed support.  States this situation has resolved, and he is wanting to re-start the meds as per last visit, would like to avoid insulin if possible .  Not checked CBG's since yesterday.  Pt denies polydipsia, polyuria, or low sugar symptoms such as shakiness improved with eating.  Overall o/w good compliance with meds, trying to follow low chol, DM diet, wt stable, little excercise however Pt denies CP, worsening sob, doe, wheezing, orthopnea, pnd, worsening LE edema, palps, dizziness or syncope  Pt denies new neuro symptoms such as headache, facial or extremity weakness    Problems Prior to Update: 1)  Thumb Pain, Right  (ICD-729.5) 2)  Abdominal Ultrasound, Abnormal  (ICD-793.6) 3)  Weakness  (ICD-780.79) 4)  Chronic Kidney Disease Unspecified  (ICD-585.9) 5)  Abdominal Tenderness  (ICD-789.60) 6)  Flank Pain, Right  (ICD-789.09) 7)  Shoulder Pain, Right  (ICD-719.41) 8)  Back Pain, Lumbar, Chronic  (ICD-724.2) 9)  Renal Insufficiency  (ICD-588.9) 10)  Cerebrovascular Accident, Hx of  (ICD-V12.50) 11)  Preventive Health Care  (ICD-V70.0) 12)  Congestive Heart Failure   (ICD-428.0) 13)  Nephrolithiasis, Hx of  (ICD-V13.01) 14)  Depression  (ICD-311) 15)  Abdominal Pain, Generalized  (ICD-789.07) 16)  Allergic Rhinitis  (ICD-477.9) 17)  Morbid Obesity  (ICD-278.01) 18)  Partial Right Nephrectomy, Hx of  (ICD-V45.73) 19)  Hypertension  (ICD-401.9) 20)  Gerd  (ICD-530.81) 21)  Diabetes Mellitus, Type II  (ICD-250.00) 22)  Hepatitis C, Hx of  (ICD-V12.09) 23)  Erectile Dysfunction  (ICD-302.72)  Medications Prior to Update: 1)  Glucotrol 10 Mg  Tabs (Glipizide) .[name].. 1 By Mouth Two Times A Day 2)  Adult Aspirin Low Strength 81 Mg  Tbdp (Aspirin) .[name].. 1 By Mouth Qd 3)  Labetalol Hcl 300 Mg Tabs (Labetalol Hcl) .... Take 1 Po  Two Times A Day 4)  Amlodipine Besylate 10 Mg Tabs (Amlodipine Besylate) .... Take 1 By Mouth Once Daily 5)  Hydralazine Hcl 100 Mg Tabs (Hydralazine Hcl) .... Take 1 By Mouth Three Times A Day 6)  Furosemide 20 Mg Tabs (Furosemide) .... Take 1 By Mouth Once Daily 7)  Lisinopril 20 Mg Tabs (Lisinopril) .[name].. 1 By Mouth Once Daily 8)  Tramadol Hcl 50 Mg Tabs (Tramadol Hcl) .[name].. 1po Four Times Per Day As Needed For Pain 9)  Actos 30 Mg Tabs (Pioglitazone Hcl) .[name].. 1po Once Daily 10)  Freestyle Lite Test  Strp (Glucose  Blood) .... Use Asd 1 Once Daily 11)  Lancets  Misc (Lancets) .... Use Asd 1 Once Daily 12)  Pravachol 20 Mg Tabs (Pravastatin Sodium) .[name].. 1po Once Daily 13)  Miralax  Powd (Polyethylene Glycol 3350) .Marland KitchenMarland KitchenMarland Kitchen 17 Gm in Water Once Daily 14)  Clonidine Hcl 0.1 Mg Tabs (Clonidine Hcl) .[name].. 1 By Mouth Two Times A Day  Current Medications (verified): 1)  Glucotrol 10 Mg  Tabs (Glipizide) .[name].. 1 By Mouth Two Times A Day 2)  Adult Aspirin Low Strength 81 Mg  Tbdp (Aspirin) .[name].. 1 By Mouth Qd 3)  Labetalol Hcl 300 Mg Tabs (Labetalol Hcl) .... Take 1 Po  Two Times A Day 4)  Amlodipine Besylate 10 Mg Tabs (Amlodipine Besylate) .... Take 1 By Mouth Once Daily 5)  Hydralazine Hcl 100 Mg Tabs (Hydralazine Hcl) .... Take 1 By Mouth Three  Times A Day 6)  Furosemide 20 Mg Tabs (Furosemide) .... Take 1 By Mouth Once Daily 7)  Lisinopril 20 Mg Tabs (Lisinopril) .[name].. 1 By Mouth Once Daily 8)  Tramadol Hcl 50 Mg Tabs (Tramadol Hcl) .[name].. 1po Four Times Per Day As Needed For Pain 9)  Actos 30 Mg Tabs (Pioglitazone Hcl) .[name].. 1po Once Daily 10)  Freestyle Lite Test  Strp (Glucose Blood) .... Use Asd 1 Once Daily 11)  Lancets  Misc (Lancets) .... Use Asd 1 Once Daily 12)  Pravachol 20 Mg Tabs (Pravastatin Sodium) .[name].. 1po Once Daily 13)  Miralax  Powd (Polyethylene Glycol 3350) .Marland KitchenMarland KitchenMarland Kitchen 17 Gm in Water Once Daily 14)  Clonidine Hcl 0.1 Mg Tabs (Clonidine Hcl) .[name].. 1 By Mouth Two Times A Day  Allergies (verified): No Known Drug Allergies  Past History:  Past Medical History: Last updated: 08/06/2009 Diabetes mellitus, type II GERD Hypertension Erectile Dysfunction Hx of Hepatitis C Morbid Obesity Allergic rhinitis Depression likely lumbar stenosis with radiculopathy/chronic LLE pain medical noncompliance right lacunar CVA 3/09 - Dr Hickling/neuro Nephrolithiasis, hx of Congestive heart failure - chronic systolic, EF 40%, Jan 2009 Renal insufficiency  Past Surgical History: Last updated: 03/25/2007 Nephrectomy, partial RR  Social History: Last updated: 08/06/2009 Never Smoked Alcohol use-no married (second time) lives alone no local family 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   Head:  normocephalic and atraumatic.   Eyes:  vision grossly intact, pupils equal, and pupils round.   Ears:  R ear normal and L ear normal.   Nose:  no external deformity and no nasal discharge.   Mouth:  no gingival abnormalities and pharynx pink and moist.   Neck:  supple and no masses.   Lungs:  normal  respiratory effort and normal breath sounds.   Heart:  normal rate and regular rhythm.   Extremities:  no edema, no erythema    Impression & Recommendations:  Problem # 1:  DIABETES MELLITUS, TYPE II (ICD-250.00)  His updated medication list for this problem includes:    Glucotrol 10 Mg Tabs (Glipizide) .[name]... 1 by mouth two times a day    Adult Aspirin Low Strength 81 Mg Tbdp (Aspirin) .[name]... 1 by mouth qd    Lisinopril 20 Mg Tabs (Lisinopril) .[name]... 1 by mouth once daily    Actos 30 Mg Tabs (Pioglitazone hcl) .[name]... 1po once daily  Labs Reviewed: Creat: 2.5 (04/23/2010)    Reviewed HgBA1c results: 11.3 (04/23/2010)  9.4 (11/06/2009) ok to re-start meds as above, may still need further meds such as januvia, Pt to cont DM diet, excercise, wt loss efforts; to check labs next visit  Problem # 2:  RENAL INSUFFICIENCY (ICD-588.9) declines further eval at this time or renal referral  Problem # 3:  ABDOMINAL ULTRASOUND, ABNORMAL (ICD-793.6) to f/u urology as planned  Problem # 4:  HYPERTENSION (ICD-401.9)  His updated medication list for this problem includes:    Labetalol Hcl 300 Mg Tabs (Labetalol hcl) .[name]... Take 1 po  two times a day    Amlodipine Besylate 10 Mg Tabs (Amlodipine besylate) .[name]... Take 1 by mouth once daily    Hydralazine Hcl 100 Mg Tabs (Hydralazine hcl) .[name]... Take 1 by mouth three times a day    Furosemide 20 Mg Tabs (Furosemide) .[name]... Take 1 by mouth once daily    Lisinopril 20 Mg Tabs (Lisinopril) .[name]... 1 by mouth once daily    Clonidine Hcl 0.1 Mg Tabs (Clonidine hcl) .[name]... 1 by mouth two times a day  BP today: 140/92 Prior BP: 130/84 (11/06/2009)  Labs Reviewed: K+: 5.0 (04/23/2010) Creat: : 2.5 (04/23/2010)   Chol: 178 (04/23/2010)   HDL: 42.00 (04/23/2010)   LDL: 114 (04/23/2010)   TG: 108.0 (04/23/2010) stable overall by hx and exam, ok to continue meds/tx as is   Complete Medication List: 1)  Glucotrol 10 Mg Tabs (Glipizide) .[name].. 1 by mouth two times a  day 2)  Adult Aspirin Low Strength 81 Mg Tbdp (Aspirin) .[name].. 1 by mouth qd 3)  Labetalol Hcl 300 Mg Tabs (Labetalol hcl) .... Take 1 po  two times a day 4)  Amlodipine Besylate 10 Mg Tabs (Amlodipine besylate) .... Take 1 by mouth once daily 5)  Hydralazine Hcl 100 Mg Tabs (Hydralazine hcl) .... Take 1 by mouth three times a day 6)  Furosemide 20 Mg Tabs (Furosemide) .... Take 1 by mouth once daily 7)  Lisinopril 20 Mg Tabs (Lisinopril) .[name].. 1 by mouth once daily 8)  Tramadol Hcl 50 Mg Tabs (Tramadol hcl) .[name].. 1po four times per day as needed for pain 9)  Actos 30 Mg Tabs (Pioglitazone hcl) .[name].. 1po once daily 10)  Freestyle Lite Test Strp (Glucose blood) .... Use asd 1 once daily 11)  Lancets Misc (Lancets) .... Use asd 1 once daily 12)  Pravachol 20 Mg Tabs (Pravastatin sodium) .[name].. 1po once daily 13)  Miralax Powd (Polyethylene glycol 3350) .Marland KitchenMarland Kitchen. 17 gm in water once daily 14)  Clonidine Hcl 0.1 Mg Tabs (Clonidine hcl) .[name].. 1 by mouth two times a day  Patient Instructions: 1)  Continue all previous medications as before this visit  2)  we'll cancel the kidney doctor referral for now 3)  You will be contacted about the referral(s) to: urology for the bladder 4)  Please schedule a follow-up appointment in 6 wks to re-check the a1c and blood tests 5)  Please check your sugar at least twice per day for the next wk and call with results to [phone]

## 2010-08-28 NOTE — Consult Note (Signed)
Summary: [facility]  [facility]   Imported By: Sherian Rein 05/17/2010 08:52:33  _____________________________________________________________________  External Attachment:    Type:   Image     Comment:   External Document

## 2010-08-28 NOTE — Assessment & Plan Note (Signed)
Summary: F/U APPT/BCBS/#CD   Vital Signs:  Patient profile:   60 year old male Height:      69 inches (175.26 cm) Weight:      247.13 pounds (112.33 kg) O2 Sat:      98 % on Room air Temp:     96.7 degrees F (35.94 degrees C) oral Pulse rate:   74 / minute BP sitting:   134 / 80  (left arm) Cuff size:   large  Vitals Entered By: Josph Macho CMA (August 06, 2009 10:35 AM)  O2 Flow:  Room air CC: Pt wants to discuss meds- he states he is lightheaded and has blurred vision after taking meds/ pt would also like to have labs done/ CF Is Patient Diabetic? Yes   CC:  Pt wants to discuss meds- he states he is lightheaded and has blurred vision after taking meds/ pt would also like to have labs done/ CF.  History of Present Illness: here after being lost to f/u for over a year, c/o ED with viagra too expensive , so wants vacuum device rx.  Stopped the wellbutrin as he felt like he was doing well wtihout it the last few months.  Needs new pain med for the chronic LLE and back pain since darvocet was taken off the market (essentially no change in in recurrent Left lower back pain with radiation to the left upper leg but without worsening bowel or bladder symtpoms, leg weakness, numbness or falls  - although he does have some left leg weakness he thinks persistent after the right lacunar infarct 2009;  currently on disability but has to wait until July 2011 for medicare to start for him (the 2 yr waiting period);  b/c of this funds very tight in the past yr, and finally got to the point he could not afford the actos since approx october 2010 so has not taken.  Has not checked sugars b/c suppies too expensive. Since jan 1 he has been on the [employer] so has re-started the actos in the last few days , but unfortuately still having significant polydipsia and polyuria, as well as some mild dizziness, dry mouth and weakness (no falls, palps or syncope), with the dizziness and weakness made  worse within an hour of taking his very high dose anti-HTN regimen.  overall seems to have good compliance except for when cost has limited his ability.  Not clear about wt changes recently except he is sheepish about wt going up some since he got re-married..  Pt denies CP, sob, doe, wheezing, orthopnea, pnd, worsening LE edema, palps, or syncope .  Pt denies new neuro symptoms such as headache, facial or extremity weakness .    Problems Prior to Update: 1)  Congestive Heart Failure  (ICD-428.0) 2)  Nephrolithiasis, Hx of  (ICD-V13.01) 3)  Depression  (ICD-311) 4)  Abdominal Pain, Generalized  (ICD-789.07) 5)  Allergic Rhinitis  (ICD-477.9) 6)  Morbid Obesity  (ICD-278.01) 7)  Partial Right Nephrectomy, Hx of  (ICD-V45.73) 8)  Hypertension  (ICD-401.9) 9)  Gerd  (ICD-530.81) 10)  Diabetes Mellitus, Type II  (ICD-250.00) 11)  Hepatitis C, Hx of  (ICD-V12.09) 12)  Erectile Dysfunction  (ICD-302.72)  Medications Prior to Update: 1)  Glucotrol 10 Mg  Tabs (Glipizide) .[name].. 1 By Mouth Bid 2)  Adult Aspirin Low Strength 81 Mg  Tbdp (Aspirin) .[name].. 1 By Mouth Qd 3)  Isosorbide Mononitrate Cr 120 Mg Xr24h-Tab (Isosorbide Mononitrate) .... Take 1 By Mouth Qd  4)  Labetalol Hcl 300 Mg Tabs (Labetalol Hcl) .... Take 4 Two Times A Day 5)  Amlodipine Besylate 10 Mg Tabs (Amlodipine Besylate) .... Take 1 By Mouth Qd 6)  Wellbutrin Xl 150 Mg Xr24h-Tab (Bupropion Hcl) .... Take 1 By Mouth Qd 7)  Hydralazine Hcl 100 Mg Tabs (Hydralazine Hcl) .... Take 1 Qid 8)  Furosemide 20 Mg Tabs (Furosemide) .... Take 1 By Mouth Qd 9)  Catapres-Tts-2 0.2 Mg/24hr Ptwk (Clonidine Hcl) .... Apply 1 Patch 1 Q Week 10)  Lisinopril 40 Mg Tabs (Lisinopril) .[name].. 1 By Mouth Once Daily 11)  Darvocet-N 100 100-650 Mg Tabs (Propoxyphene N-Apap) .[name].. 1po Two Times A Day As Needed 12)  Actos 30 Mg Tabs (Pioglitazone Hcl) .[name].. 1 By Mouth Once Daily  Current Medications (verified): 1)  Glucotrol 10 Mg  Tabs (Glipizide) .[name].. 1 By  Mouth Two Times A Day 2)  Adult Aspirin Low Strength 81 Mg  Tbdp (Aspirin) .[name].. 1 By Mouth Qd 3)  Isosorbide Mononitrate Cr 120 Mg Xr24h-Tab (Isosorbide Mononitrate) .... Take 1 By Mouth Once Daily 4)  Labetalol Hcl 300 Mg Tabs (Labetalol Hcl) .... Take 1 Po  Two Times A Day 5)  Amlodipine Besylate 10 Mg Tabs (Amlodipine Besylate) .... Take 1 By Mouth Once Daily 6)  Hydralazine Hcl 100 Mg Tabs (Hydralazine Hcl) .... Take 1 By Mouth Three Times A Day 7)  Furosemide 20 Mg Tabs (Furosemide) .... Take 1 By Mouth Once Daily 8)  Catapres-Tts-2 0.2 Mg/24hr Ptwk (Clonidine Hcl) .... Apply 1 Patch 1 Q Week 9)  Lisinopril 40 Mg Tabs (Lisinopril) .[name].. 1 By Mouth Once Daily 10)  Tramadol Hcl 50 Mg Tabs (Tramadol Hcl) .[name].. 1po Four Times Per Day As Needed For Pain 11)  Actos 30 Mg Tabs (Pioglitazone Hcl) .[name].. 1 By Mouth Once Daily 12)  Freestyle Lite Test  Strp (Glucose Blood) .... Use Asd 1 Once Daily 13)  Lancets  Misc (Lancets) .... 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 DM CAD  Social  History: Reviewed history from 03/18/2009 and no changes required. 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         all otherwise negative per pt  Physical Exam  General:  alert and overweight-appearing.   Head:  normocephalic and atraumatic.   Eyes:  vision grossly intact, pupils equal, and pupils round.   Ears:  R ear normal and L ear normal.   Nose:  no external deformity and no nasal discharge.   Mouth:  no gingival abnormalities and pharynx pink and moist.   Neck:  supple and no masses.   Lungs:  normal respiratory effort and normal breath sounds.   Heart:  normal rate and regular rhythm.   Abdomen:  soft, non-tender, and normal bowel sounds.   Msk:  no joint tenderness and no joint swelling.   Extremities:  no edema, no erythema  Neurologic:  alert & oriented X3 and cranial nerves II-XII intact.  and motor grossly intact but not done in detail   Impression & Recommendations:  Problem # 1:  Preventive Health Care (ICD-V70.0) Overall doing well, up to date, counseled on routine health concerns for screening and prevention, immunizations up to date or declined, labs ordered Orders: TLB-BMP (Basic Metabolic Panel-BMET) (80048-METABOL) TLB-CBC Platelet - w/Differential (85025-CBCD) TLB-Hepatic/Liver Function Pnl (80076-HEPATIC) TLB-Lipid Panel (80061-LIPID) TLB-TSH (Thyroid Stimulating Hormone) (84443-TSH) TLB-PSA  (Prostate Specific Antigen) (84153-PSA) TLB-Udip ONLY (81003-UDIP)  Problem # 2:  DIABETES MELLITUS, TYPE II (ICD-250.00)  His updated medication list for this problem includes:    Glucotrol 10 Mg Tabs (Glipizide) .[name]... 1 by mouth two times a day    Adult Aspirin Low Strength 81 Mg Tbdp (Aspirin) .[name]... 1 by mouth qd    Lisinopril 40 Mg Tabs (Lisinopril) .[name]... 1 by mouth once daily    Actos 30 Mg Tabs (Pioglitazone hcl) .[name]... 1 by mouth once daily  Orders: TLB-A1C / Hgb A1C (Glycohemoglobin) (83036-A1C) TLB-Microalbumin/Creat Ratio, Urine (82043-MALB)  uncontrolled recently due to wt gain and not taking the actos due to cost, but now working in his diet, trying to be more active, and did re-start the actos a few days ago;  Continue all previous medications as before this visit ; declines DM clinic referral (has been previously);  to check labs as above, consider increased OHA if needed;  gave new glucometer and supplies rx today to check sugars at least once daily;  for now plan is to f/u 6 wks, or sooner if needed;  does not appear to need referral to ER at this time for IVF's though he likely has some element of dehydration/volume depletion given the hx and exam (although formal orthostatics not done)  Problem # 3:  HYPERTENSION (ICD-401.9)  His updated medication list for this problem includes:    Labetalol Hcl 300 Mg Tabs (Labetalol hcl) .[name]... Take 1 po  two times a day    Amlodipine Besylate 10 Mg Tabs (Amlodipine besylate) .[name]... Take 1 by mouth once daily    Hydralazine Hcl 100 Mg Tabs (Hydralazine hcl) .[name]... Take 1 by mouth three times a day    Furosemide 20 Mg Tabs (Furosemide) .[name]... Take 1 by mouth once daily    Catapres-tts-2 0.2 Mg/24hr Ptwk (Clonidine hcl) .[name]... Apply 1 patch 1 q week    Lisinopril 40 Mg Tabs (Lisinopril) .[name]... 1 by mouth once daily suspect some element of dehydration with dizziness exac by antiHTN meds - to decrease the labetolol for now and the hydralzaine  above; Continue all other previous medications as before this visit ; follow closely BP at home as well as s/s of CHF  BP today: 134/80 Prior BP: 132/88 (03/18/2009)  Labs Reviewed: K+: 4.6 (03/18/2009) Creat: : 1.9 (03/18/2009)     Problem # 4:  CONGESTIVE HEART FAILURE (ICD-428.0)  His updated medication  list for this problem includes:    Adult Aspirin Low Strength 81 Mg Tbdp (Aspirin) .[name]... 1 by mouth qd    Labetalol Hcl 300 Mg Tabs (Labetalol hcl) .[name]... Take 1 po  two times a day    Furosemide 20 Mg Tabs (Furosemide) .[name]... Take 1 by mouth once daily    Lisinopril 40 Mg Tabs (Lisinopril) .[name]... 1 by mouth once daily stable overall by hx and exam, ok to continue meds/tx as is   Problem # 5:  RENAL INSUFFICIENCY (ICD-588.9) has had cr 1.6 increased to 1.9 last yearl cont ACE for now and re-check with labs as above - hopefully no significant change (and seems less likely given no low sugar reactions on his OHA's)  Problem # 6:  CEREBROVASCULAR ACCIDENT, HX OF (ICD-V12.50) stable overall by hx and exam, ok to continue meds/tx as is ; cont ASA  Problem # 7:  DEPRESSION (ICD-311)  The following medications were removed from the medication list:    Wellbutrin Xl 150 Mg Xr24h-tab (Bupropion hcl) .[name]... Take 1 by mouth qd stable overall by hx and exam, ok to continue meds/tx as is  - currently off the wellbutrin  Problem # 8:  BACK PAIN, LUMBAR, CHRONIC (ICD-724.2)  His updated medication list for this problem includes:    Adult Aspirin Low Strength 81 Mg Tbdp (Aspirin) .[name]... 1 by mouth qd    Tramadol Hcl 50 Mg Tabs (Tramadol hcl) .[name]... 1po four times per day as needed for pain with left leg pain; no change essentially, ok to cont meds as is  Problem # 9:  ERECTILE DYSFUNCTION (ICD-302.72) gave manual rx for vacuum device for ED  Problem # 10:  PARTIAL RIGHT NEPHRECTOMY, HX OF (ICD-V45.73)  noted; follow  Complete Medication List: 1)  Glucotrol 10 Mg Tabs (Glipizide) .[name].. 1 by  mouth two times a day 2)  Adult Aspirin Low Strength 81 Mg Tbdp (Aspirin) .[name].. 1 by mouth qd 3)  Isosorbide Mononitrate Cr 120 Mg Xr24h-tab (Isosorbide mononitrate) .... Take 1 by mouth once daily 4)  Labetalol Hcl 300 Mg Tabs (Labetalol hcl) .... Take 1 po  two times a day 5)  Amlodipine Besylate 10 Mg Tabs (Amlodipine besylate) .... Take 1 by mouth once daily 6)  Hydralazine Hcl 100 Mg Tabs (Hydralazine hcl) .... Take 1 by mouth three times a day 7)  Furosemide 20 Mg Tabs (Furosemide) .... Take 1 by mouth once daily 8)  Catapres-tts-2 0.2 Mg/24hr Ptwk (Clonidine hcl) .... Apply 1 patch 1 q week 9)  Lisinopril 40 Mg Tabs (Lisinopril) .[name].. 1 by mouth once daily 10)  Tramadol Hcl 50 Mg Tabs (Tramadol hcl) .[name].. 1po four times per day as needed for pain 11)  Actos 30 Mg Tabs (Pioglitazone hcl) .[name].. 1 by mouth once daily 12)  Freestyle Lite Test Strp (Glucose blood) .... Use asd 1 once daily 13)  Lancets Misc (Lancets) .... Use asd 1 once daily  Patient Instructions: 1)  stop the wellbutrin and the darvocet 2)  decrease the labetalol to 300 mg twice per day only (one pill twice per day) 3)  decrease the hydralazine to 100 mg three times per day only 4)  Continue all previous medications as before this visit, including re-start the actos as prescribed 5)  start the tramadol which is the new medication for pain (only to use if you need it) 6)  you are given the new glucometer (sugar machine) and the supplies 7)  Please go to the Lab  in the basement for your blood and/or urine tests today 8)  Please schedule a follow-up appointment in 6 wks (or sooner if you need to) with: 9)  BMP prior to visit, ICD-9: 250.02 10)  Lipid Panel prior to visit, ICD-9: 11)  HbgA1C prior to visit, ICD-9: 12)  Hepatic Panel prior to visit, ICD-9: v58.69 Prescriptions: LANCETS  MISC (LANCETS) use asd 1 once daily  #100 x 11   Entered and Authorized by:   Corwin Levins MD   Signed by:   Corwin Levins MD on 08/06/2009    Method used:   Print then Give to Patient   RxID:   [id number] FREESTYLE LITE TEST  STRP (GLUCOSE BLOOD) use asd 1 once daily  #100 x 11   Entered and Authorized by:   Corwin Levins MD   Signed by:   Corwin Levins MD on 08/06/2009   Method used:   Print then Give to Patient   RxID:   [id number] ACTOS 30 MG TABS (PIOGLITAZONE HCL) 1 by mouth once daily  #90 x 3   Entered and Authorized by:   Corwin Levins MD   Signed by:   Corwin Levins MD on 08/06/2009   Method used:   Print then Give to Patient   RxID:   [id number] TRAMADOL HCL 50 MG TABS (TRAMADOL HCL) 1po four times per day as needed for pain  #120 x 2   Entered and Authorized by:   Corwin Levins MD   Signed by:   Corwin Levins MD on 08/06/2009   Method used:   Print then Give to Patient   RxID:   [id number] LISINOPRIL 40 MG TABS (LISINOPRIL) 1 by mouth once daily  #90 x 3   Entered and Authorized by:   Corwin Levins MD   Signed by:   Corwin Levins MD on 08/06/2009   Method used:   Print then Give to Patient   RxID:   [id number] CATAPRES-TTS-2 0.2 MG/24HR PTWK (CLONIDINE HCL) apply 1 patch 1 q week  #12 x 3   Entered and Authorized by:   Corwin Levins MD   Signed by:   Corwin Levins MD on 08/06/2009   Method used:   Print then Give to Patient   RxID:   [id number] FUROSEMIDE 20 MG TABS (FUROSEMIDE) take 1 by mouth once daily  #90 x 3   Entered and Authorized by:   Corwin Levins MD   Signed by:   Corwin Levins MD on 08/06/2009   Method used:   Print then Give to Patient   RxID:   [id number] HYDRALAZINE HCL 100 MG TABS (HYDRALAZINE HCL) take 1 by mouth three times a day  #270 x 3   Entered and Authorized by:   Corwin Levins MD   Signed by:   Corwin Levins MD on 08/06/2009   Method used:   Print then Give to Patient   RxID:   [id number] AMLODIPINE BESYLATE 10 MG TABS (AMLODIPINE BESYLATE) take 1 by mouth once daily  #90 x 3   Entered and Authorized by:   Corwin Levins MD   Signed by:   Corwin Levins MD on 08/06/2009   Method used:   Print then Give to Patient   RxID:   [id number] LABETALOL HCL 300 MG TABS (LABETALOL HCL) take 1 po  two times a day  #180 x 3   Entered and Authorized  by:   Corwin Levins MD   Signed by:   Corwin Levins MD on 08/06/2009   Method used:   Print then Give to Patient   RxID:   [id number] ISOSORBIDE MONONITRATE CR 120 MG XR24H-TAB (ISOSORBIDE MONONITRATE) take 1 by mouth once daily  #90 x 3   Entered and Authorized by:   Corwin Levins MD   Signed by:   Corwin Levins MD on 08/06/2009   Method used:   Print then Give to Patient   RxID:   [id number] GLUCOTROL 10 MG  TABS (GLIPIZIDE) 1 by mouth two times a day  #180 x 3   Entered and Authorized by:   Corwin Levins MD   Signed by:   Corwin Levins MD on 08/06/2009   Method used:   Print then Give to Patient   RxID:   [id number] LABETALOL HCL 300 MG TABS (LABETALOL HCL) take 1 po  two times a day  #180 x 3   Entered and Authorized by:   Corwin Levins MD   Signed by:   Corwin Levins MD on 08/06/2009   Method used:   Electronically to        [facility] [address] (retail)       [address] [place], [place]  [address]       Ph: [phone]       Fax: [phone]   RxID:   [id number]

## 2010-08-28 NOTE — Consult Note (Signed)
Summary: [facility]  [facility]   Imported By: Lennie Odor 07/02/2010 11:48:31  _____________________________________________________________________  External Attachment:    Type:   Image     Comment:   External Document

## 2010-08-28 NOTE — Miscellaneous (Signed)
Summary: Orders Update  Clinical Lists Changes  Orders: Added new Referral order of Nephrology Referral (Nephro) - Signed 

## 2010-08-28 NOTE — Progress Notes (Signed)
Summary: refill request  Phone Note Refill Request Message from:  Fax from Pharmacy on July 31, 2010 4:18 PM  Refills Requested: Medication #1:  LISINOPRIL 20 MG TABS 1 by mouth once daily   Dosage confirmed as above?Dosage Confirmed   Last Refilled: 04/15/2010  Medication #2:  HYDRALAZINE HCL 100 MG TABS take 1 by mouth three times a day   Dosage confirmed as above?Dosage Confirmed   Last Refilled: 07/08/2010  Medication #3:  GLUCOTROL 10 MG  TABS 1 by mouth two times a day   Dosage confirmed as above?Dosage Confirmed   Last Refilled: 04/15/2010  Medication #4:  FUROSEMIDE 20 MG TABS take 1 by mouth once daily   Dosage confirmed as above?Dosage Confirmed   Last Refilled: 04/15/2010  Method Requested: Electronic Next Appointment Scheduled: 08/01/2010 Initial call taken by: Brenton Grills CMA (AAMA),  July 31, 2010 4:22 PM    Prescriptions: GLUCOTROL 10 MG  TABS (GLIPIZIDE) 1 by mouth two times a day  #180 x 1   Entered by:   Brenton Grills CMA (AAMA)   Authorized by:   Corwin Levins MD   Signed by:   Brenton Grills CMA (AAMA) on 07/31/2010   Method used:   Electronically to        [facility] [address] (retail)       [address]       [place], [place]  [address]       Ph: [phone]       Fax: [phone]   RxID:   [id number] HYDRALAZINE HCL 100 MG TABS (HYDRALAZINE HCL) take 1 by mouth three times a day  #270 x 1   Entered by:   Brenton Grills CMA (AAMA)   Authorized by:   Corwin Levins MD   Signed by:   Brenton Grills CMA (AAMA) on 07/31/2010   Method used:   Electronically to        [facility] [address] (retail)       [address]       [place], [place]  [address]       Ph: [phone]       Fax: [phone]   RxID:   [id number] FUROSEMIDE 20 MG TABS (FUROSEMIDE) take 1 by mouth once daily  #90 x 1   Entered by:   Brenton Grills CMA (AAMA)   Authorized by:   Corwin Levins MD   Signed by:    Brenton Grills CMA (AAMA) on 07/31/2010   Method used:   Electronically to        [facility] [address] (retail)       [address]       [place], [place]  [address]       Ph: [phone]       Fax: [phone]   RxID:   [id number] LISINOPRIL 20 MG TABS (LISINOPRIL) 1 by mouth once daily  #90 x 1   Entered by:   Brenton Grills CMA (AAMA)   Authorized by:   Corwin Levins MD   Signed by:   Brenton Grills CMA (AAMA) on 07/31/2010   Method used:   Electronically to        [facility] [address] (retail)       [address]  [place], [place]  [address]       Ph: [phone]       Fax: [phone]   RxID:   [id number]

## 2010-08-28 NOTE — Miscellaneous (Signed)
Summary: Orders Update  Clinical Lists Changes  Problems: Added new problem of CHRONIC KIDNEY DISEASE UNSPECIFIED (ICD-585.9) Medications: Changed medication from ACTOS 15 MG TABS (PIOGLITAZONE HCL) 1po once daily to ACTOS 30 MG TABS (PIOGLITAZONE HCL) 1po once daily - Signed Added new medication of PRAVACHOL 20 MG TABS (PRAVASTATIN SODIUM) 1po once daily - Signed Added new medication of MIRALAX  POWD (POLYETHYLENE GLYCOL 3350) 17 gm in water once daily Rx of ACTOS 30 MG TABS (PIOGLITAZONE HCL) 1po once daily;  #30 x 11;  Signed;  Entered by: Corwin Levins MD;  Authorized by: Corwin Levins MD;  Method used: Electronically to [facility] [address], [place], [place], [place]  [address], Ph: [phone], Fax: [phone] Rx of PRAVACHOL 20 MG TABS (PRAVASTATIN SODIUM) 1po once daily;  #30 x 11;  Signed;  Entered by: Corwin Levins MD;  Authorized by: Corwin Levins MD;  Method used: Electronically to [facility] [address], [place], [place], [place]  [address], Ph: [phone], Fax: [phone] Orders: Added new Referral order of Radiology Referral (Radiology) - Signed    Prescriptions: PRAVACHOL 20 MG TABS (PRAVASTATIN SODIUM) 1po once daily  #30 x 11   Entered and Authorized by:   Corwin Levins MD   Signed by:   Corwin Levins MD on 11/06/2009   Method used:   Electronically to        [facility] [address] (retail)       [address]       [place], [place]  [address]       Ph: [phone]       Fax: [phone]   RxID:   [id number] ACTOS 30 MG TABS (PIOGLITAZONE HCL) 1po once daily  #30 x 11   Entered and Authorized by:   Corwin Levins MD   Signed by:   Corwin Levins MD on 11/06/2009   Method used:   Electronically to        [facility] [address] (retail)       [address]       [place], [place]  [address]       Ph: [phone]       Fax: [phone]   RxID:   [id number]

## 2010-08-28 NOTE — Progress Notes (Signed)
Summary: refill request  Phone Note Refill Request Message from:  Fax from Pharmacy on July 31, 2010 4:29 PM  Refills Requested: Medication #1:  LABETALOL HCL 300 MG TABS take 1 po  two times a day   Dosage confirmed as above?Dosage Confirmed   Last Refilled: 07/08/2010  Medication #2:  AMLODIPINE BESYLATE 10 MG TABS take 1 by mouth once daily   Dosage confirmed as above?Dosage Confirmed   Last Refilled: 07/08/2010  Method Requested: Electronic Next Appointment Scheduled: 08/01/2010 Initial call taken by: Brenton Grills CMA (AAMA),  July 31, 2010 4:32 PM    Prescriptions: LABETALOL HCL 300 MG TABS (LABETALOL HCL) take 1 po  two times a day  #180 x 1   Entered by:   Brenton Grills CMA (AAMA)   Authorized by:   Corwin Levins MD   Signed by:   Brenton Grills CMA (AAMA) on 07/31/2010   Method used:   Electronically to        [facility] [address] (retail)       [address] [place], [place]  [address]       Ph: [phone]       Fax: [phone]   RxID:   [id number] AMLODIPINE BESYLATE 10 MG TABS (AMLODIPINE BESYLATE) take 1 by mouth once daily  #90 x 1   Entered by:   Brenton Grills CMA (AAMA)   Authorized by:   Corwin Levins MD   Signed by:   Brenton Grills CMA (AAMA) on 07/31/2010   Method used:   Electronically to        [facility] [address] (retail)       [address] [place], [place]  [address]       Ph: [phone]       Fax: [phone]   RxID:   [id number]

## 2010-08-28 NOTE — Assessment & Plan Note (Signed)
Summary: 6 wk fu  stc   Vital Signs:  Patient profile:   60 year old male Height:      69.5 inches Weight:      245 pounds BMI:     35.79 O2 Sat:      98 % on Room air Temp:     97.1 degrees F oral Pulse rate:   78 / minute BP sitting:   132 / 84  (left arm) Cuff size:   large  Vitals Entered ByZella Ball Ewing (September 18, 2009 10:21 AM)  O2 Flow:  Room air CC: 6 week followup/RE   CC:  6 week followup/RE.  History of Present Illness: here for f/u, overall doing well.  Pt denies CP, sob, doe, wheezing, orthopnea, pnd, worsening LE edema, palps, dizziness or syncope   Pt denies new neuro symptoms such as headache, facial or extremity weakness   Pt denies polydipsia, polyuria, or low sugar symptoms such as shakiness improved with eating.  Overall good compliance with meds, trying to follow low chol, DM diet, wt stable, little excercise however   Good med tolerance as well.  No new compalints except onoging right shoudler soreness but not limiting, pain approx 2/10, worse to abduct but not limiting.    Problems Prior to Update: 1)  Back Pain, Lumbar, Chronic  (ICD-724.2) 2)  Renal Insufficiency  (ICD-588.9) 3)  Cerebrovascular Accident, Hx of  (ICD-V12.50) 4)  Preventive Health Care  (ICD-V70.0) 5)  Congestive Heart Failure  (ICD-428.0) 6)  Nephrolithiasis, Hx of  (ICD-V13.01) 7)  Depression  (ICD-311) 8)  Abdominal Pain, Generalized  (ICD-789.07) 9)  Allergic Rhinitis  (ICD-477.9) 10)  Morbid Obesity  (ICD-278.01) 11)  Partial Right Nephrectomy, Hx of  (ICD-V45.73) 12)  Hypertension  (ICD-401.9) 13)  Gerd  (ICD-530.81) 14)  Diabetes Mellitus, Type II  (ICD-250.00) 15)  Hepatitis C, Hx of  (ICD-V12.09) 16)  Erectile Dysfunction  (ICD-302.72)  Medications Prior to Update: 1)  Glucotrol 10 Mg  Tabs (Glipizide) .[name].. 1 By Mouth Two Times A Day 2)  Adult Aspirin Low Strength 81 Mg  Tbdp (Aspirin) .[name].. 1 By Mouth Qd 3)  Isosorbide Mononitrate Cr 120 Mg Xr24h-Tab (Isosorbide  Mononitrate) .... Take 1 By Mouth Once Daily 4)  Labetalol Hcl 300 Mg Tabs (Labetalol Hcl) .... Take 1 Po  Two Times A Day 5)  Amlodipine Besylate 10 Mg Tabs (Amlodipine Besylate) .... Take 1 By Mouth Once Daily 6)  Hydralazine Hcl 100 Mg Tabs (Hydralazine Hcl) .... Take 1 By Mouth Three Times A Day 7)  Furosemide 20 Mg Tabs (Furosemide) .... Take 1 By Mouth Once Daily 8)  Catapres-Tts-2 0.2 Mg/24hr Ptwk (Clonidine Hcl) .... Apply 1 Patch 1 Q Week 9)  Lisinopril 40 Mg Tabs (Lisinopril) .[name].. 1 By Mouth Once Daily 10)  Tramadol Hcl 50 Mg Tabs (Tramadol Hcl) .[name].. 1po Four Times Per Day As Needed For Pain 11)  Actos 15 Mg Tabs (Pioglitazone Hcl) .[name].. 1po Once Daily 12)  Freestyle Lite Test  Strp (Glucose Blood) .... Use Asd 1 Once Daily 13)  Lancets  Misc (Lancets) .... Use Asd 1 Once Daily  Current Medications (verified): 1)  Glucotrol 10 Mg  Tabs (Glipizide) .[name].. 1 By Mouth Two Times A Day 2)  Adult Aspirin Low Strength 81 Mg  Tbdp (Aspirin) .[name].. 1 By Mouth Qd 3)  Isosorbide Mononitrate Cr 120 Mg Xr24h-Tab (Isosorbide Mononitrate) .... Take 1 By Mouth Once Daily 4)  Labetalol Hcl 300 Mg Tabs (Labetalol Hcl) .Marland KitchenMarland KitchenMarland Kitchen  Take 1 Po  Two Times A Day 5)  Amlodipine Besylate 10 Mg Tabs (Amlodipine Besylate) .... Take 1 By Mouth Once Daily 6)  Hydralazine Hcl 100 Mg Tabs (Hydralazine Hcl) .... Take 1 By Mouth Three Times A Day 7)  Furosemide 20 Mg Tabs (Furosemide) .... Take 1 By Mouth Once Daily 8)  Catapres-Tts-2 0.2 Mg/24hr Ptwk (Clonidine Hcl) .... Apply 1 Patch 1 Q Week 9)  Lisinopril 40 Mg Tabs (Lisinopril) .[name].. 1 By Mouth Once Daily 10)  Tramadol Hcl 50 Mg Tabs (Tramadol Hcl) .[name].. 1po Four Times Per Day As Needed For Pain 11)  Actos 15 Mg Tabs (Pioglitazone Hcl) .[name].. 1po Once Daily 12)  Freestyle Lite Test  Strp (Glucose Blood) .... Use Asd 1 Once Daily 13)  Lancets  Misc (Lancets) .... Use Asd 1 Once Daily  Allergies (verified): No Known Drug Allergies  Past History:  Past Medical  History: Last updated: 08/06/2009 Diabetes mellitus, type II GERD Hypertension Erectile Dysfunction Hx of Hepatitis C Morbid Obesity Allergic rhinitis Depression likely lumbar stenosis with radiculopathy/chronic LLE pain medical noncompliance right lacunar CVA 3/09 - Dr Hickling/neuro Nephrolithiasis, hx of Congestive heart failure - chronic systolic, EF 40%, Jan 2009 Renal insufficiency  Past Surgical History: Last updated: 03/25/2007 Nephrectomy, partial RR  Social History: Last updated: 08/06/2009 Never Smoked Alcohol use-no married (second time) lives alone no local family 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   Head:  normocephalic and atraumatic.   Eyes:  vision grossly intact, pupils equal, and pupils round.   Ears:  R ear normal and L ear normal.   Nose:  no external deformity and no nasal discharge.   Mouth:  no gingival abnormalities and pharynx pink and moist.   Neck:  supple and no masses.   Lungs:  normal respiratory effort and normal breath sounds.   Heart:  normal rate and regular rhythm.   Msk:  right shoudler nontender and FROM, has full abduction Extremities:  no edema, no erythema    Impression & Recommendations:  Problem # 1:  DIABETES MELLITUS, TYPE II (ICD-250.00)  His updated medication list for this problem includes:    Glucotrol 10 Mg Tabs (Glipizide) .[name]... 1 by mouth two times a day    Adult Aspirin Low Strength 81 Mg Tbdp (Aspirin) .[name]... 1 by mouth qd    Lisinopril 40 Mg Tabs (Lisinopril) .[name]... 1 by mouth once daily    Actos 15 Mg Tabs (Pioglitazone hcl) .[name]... 1po once daily  Orders: TLB-BMP (Basic Metabolic Panel-BMET) (80048-METABOL) TLB-Lipid Panel (80061-LIPID) TLB-A1C / Hgb A1C (Glycohemoglobin)  (83036-A1C)  Labs Reviewed: Creat: 1.8 (08/06/2009)    Reviewed HgBA1c results: 7.2 (08/06/2009)  9.2 (03/18/2009) improved, overall stable overall by hx and exam, ok to continue meds/tx as is , Pt to cont DM diet, excercise, wt loss efforts; to check labs today   Problem # 2:  HYPERTENSION (ICD-401.9)  His updated medication list for this problem includes:    Labetalol Hcl 300 Mg Tabs (Labetalol hcl) .[name]... Take 1 po  two times a day    Amlodipine Besylate 10 Mg Tabs (Amlodipine besylate) .[name]... Take 1 by mouth once daily    Hydralazine Hcl 100 Mg Tabs (Hydralazine hcl) .[name]... Take 1 by mouth three  times a day    Furosemide 20 Mg Tabs (Furosemide) .[name]... Take 1 by mouth once daily    Catapres-tts-2 0.2 Mg/24hr Ptwk (Clonidine hcl) .[name]... Apply 1 patch 1 q week    Lisinopril 40 Mg Tabs (Lisinopril) .[name]... 1 by mouth once daily  BP today: 132/84 Prior BP: 134/80 (08/06/2009)  Labs Reviewed: K+: 4.5 (08/06/2009) Creat: : 1.8 (08/06/2009)   Chol: 176 (08/06/2009)   HDL: 48.40 (08/06/2009)   LDL: 105 (08/06/2009)   TG: 115.0 (08/06/2009) stable overall by hx and exam, ok to continue meds/tx as is   Problem # 3:  RENAL INSUFFICIENCY (ICD-588.9) stable overall by hx and exam, ok to continue meds/tx as is ; most recent lab reviewed wih pt, cont meds as is  Problem # 4:  CONGESTIVE HEART FAILURE (ICD-428.0)  His updated medication list for this problem includes:    Adult Aspirin Low Strength 81 Mg Tbdp (Aspirin) .[name]... 1 by mouth qd    Labetalol Hcl 300 Mg Tabs (Labetalol hcl) .[name]... Take 1 po  two times a day    Furosemide 20 Mg Tabs (Furosemide) .[name]... Take 1 by mouth once daily    Lisinopril 40 Mg Tabs (Lisinopril) .[name]... 1 by mouth once daily stable overall by hx and exam, ok to continue meds/tx as is   Problem # 5:  SHOULDER PAIN, RIGHT (ICD-719.41)  His updated medication list for this problem includes:    Adult Aspirin Low Strength 81 Mg Tbdp (Aspirin) .[name]... 1 by mouth qd    Tramadol  Hcl 50 Mg Tabs (Tramadol hcl) .[name]... 1po four times per day as needed for pain exam seems benign, suspect ? DJD, ok to foloow for now, consider orhto if persists or worsens  Complete Medication List: 1)  Glucotrol 10 Mg Tabs (Glipizide) .[name].. 1 by mouth two times a day 2)  Adult Aspirin Low Strength 81 Mg Tbdp (Aspirin) .[name].. 1 by mouth qd 3)  Isosorbide Mononitrate Cr 120 Mg Xr24h-tab (Isosorbide mononitrate) .... Take 1 by mouth once daily 4)  Labetalol Hcl 300 Mg Tabs (Labetalol hcl) .... Take 1 po  two times a day 5)  Amlodipine Besylate 10 Mg Tabs (Amlodipine besylate) .... Take 1 by mouth once daily 6)  Hydralazine Hcl 100 Mg Tabs (Hydralazine hcl) .... Take 1 by mouth three times a day 7)  Furosemide 20 Mg Tabs (Furosemide) .... Take 1 by mouth once daily 8)  Catapres-tts-2 0.2 Mg/24hr Ptwk (Clonidine hcl) .... Apply 1 patch 1 q week 9)  Lisinopril 40 Mg Tabs (Lisinopril) .[name].. 1 by mouth once daily 10)  Tramadol Hcl 50 Mg Tabs (Tramadol hcl) .[name].. 1po four times per day as needed for pain 11)  Actos 15 Mg Tabs (Pioglitazone hcl) .[name].. 1po once daily 12)  Freestyle Lite Test Strp (Glucose blood) .... Use asd 1 once daily 13)  Lancets Misc (Lancets) .... Use asd 1 once daily  Patient Instructions: 1)  Please go to the Lab in the basement for your blood and/or urine tests today 2)  Continue all previous medications as before this visit  3)  please call if you feel you need the referral to orthopedic for the right shoulder 4)  Please schedule a follow-up appointment in 6 months with: 5)  BMP prior to visit, ICD-9: 250.02 6)  Lipid Panel prior to visit, ICD-9: 7)  HbgA1C prior to visit, ICD-9:

## 2010-08-28 NOTE — Assessment & Plan Note (Signed)
Summary: FU---STC   Vital Signs:  Patient profile:   60 year old male Height:      70 inches Weight:      264.38 pounds BMI:     38.07 O2 Sat:      99 % on Room air Temp:     98.8 degrees F oral Pulse rate:   76 / minute BP sitting:   122 / 80  (left arm) Cuff size:   large  Vitals Entered By: Zella Ball Ewing CMA (AAMA) (August 01, 2010 9:02 AM)  O2 Flow:  Room air CC: Followup/RE   CC:  Followup/RE.  History of Present Illness: here for f/u; overall doing realtively well;  Pt denies CP, worsening sob, doe, wheezing, orthopnea, pnd,  palps, dizziness or syncope , but has had increased LE edema for 4 wks for unclear reasons.  Overall good compliance with meds, and good tolerability.  Pt denies new neuro symptoms such as headache, facial or extremity weakness  Pt denies polydipsia, polyuria, or low sugar symptoms such as shakiness improved with eating.  Overall good compliance with meds, trying to follow low chol, DM diet, wt stable, little excercise however  CBG's somewhat variable in the 100's.  No fever, wt loss, night sweats, loss of appetite or other constitutional symptoms  Overall good compliance with meds, and good tolerability. Denies worsening depressive symptoms, suicidal ideation, or panic.   Does occasianlly have RUQ pain with known gallstone, but no recent owrsening pain, n/v, wt loss, dysphagia, bowel change or blood.    Preventive Screening-Counseling & Management      Drug Use:  no.    Problems Prior to Update: 1)  Peripheral Edema  (ICD-782.3) 2)  Benign Prostatic Hypertrophy  (ICD-600.00) 3)  Cholelithiasis  (ICD-574.20) 4)  Thumb Pain, Right  (ICD-729.5) 5)  Abdominal Ultrasound, Abnormal  (ICD-793.6) 6)  Weakness  (ICD-780.79) 7)  Chronic Kidney Disease Unspecified  (ICD-585.9) 8)  Abdominal Tenderness  (ICD-789.60) 9)  Flank Pain, Right  (ICD-789.09) 10)  Shoulder Pain, Right  (ICD-719.41) 11)  Back Pain, Lumbar, Chronic  (ICD-724.2) 12)  Renal Insufficiency   (ICD-588.9) 13)  Cerebrovascular Accident, Hx of  (ICD-V12.50) 14)  Preventive Health Care  (ICD-V70.0) 15)  Congestive Heart Failure  (ICD-428.0) 16)  Nephrolithiasis, Hx of  (ICD-V13.01) 17)  Depression  (ICD-311) 18)  Abdominal Pain, Generalized  (ICD-789.07) 19)  Allergic Rhinitis  (ICD-477.9) 20)  Morbid Obesity  (ICD-278.01) 21)  Partial Right Nephrectomy, Hx of  (ICD-V45.73) 22)  Hypertension  (ICD-401.9) 23)  Gerd  (ICD-530.81) 24)  Diabetes Mellitus, Type II  (ICD-250.00) 25)  Hepatitis C, Hx of  (ICD-V12.09) 26)  Erectile Dysfunction  (ICD-302.72)  Medications Prior to Update: 1)  Glucotrol 10 Mg  Tabs (Glipizide) .[name].. 1 By Mouth Two Times A Day 2)  Adult Aspirin Low Strength 81 Mg  Tbdp (Aspirin) .[name].. 1 By Mouth Qd 3)  Labetalol Hcl 300 Mg Tabs (Labetalol Hcl) .... Take 1 Po  Two Times A Day 4)  Amlodipine Besylate 10 Mg Tabs (Amlodipine Besylate) .... Take 1 By Mouth Once Daily 5)  Hydralazine Hcl 100 Mg Tabs (Hydralazine Hcl) .... Take 1 By Mouth Three Times A Day 6)  Furosemide 20 Mg Tabs (Furosemide) .... Take 1 By Mouth Once Daily 7)  Lisinopril 20 Mg Tabs (Lisinopril) .[name].. 1 By Mouth Once Daily 8)  Tramadol Hcl 50 Mg Tabs (Tramadol Hcl) .[name].. 1po Four Times Per Day As Needed For Pain 9)  Actos 30 Mg  Tabs (Pioglitazone Hcl) .[name].. 1po Once Daily 10)  Freestyle Lite Test  Strp (Glucose Blood) .... Use Asd 1 Once Daily 11)  Lancets  Misc (Lancets) .... Use Asd 1 Once Daily 12)  Pravachol 20 Mg Tabs (Pravastatin Sodium) .[name].. 1po Once Daily 13)  Miralax  Powd (Polyethylene Glycol 3350) .Marland KitchenMarland KitchenMarland Kitchen 17 Gm in Water Once Daily 14)  Clonidine Hcl 0.1 Mg Tabs (Clonidine Hcl) .[name].. 1 By Mouth Two Times A Day  Current Medications (verified): 1)  Glucotrol 10 Mg  Tabs (Glipizide) .[name].. 1 By Mouth Two Times A Day 2)  Adult Aspirin Low Strength 81 Mg  Tbdp (Aspirin) .[name].. 1 By Mouth Qd 3)  Labetalol Hcl 300 Mg Tabs (Labetalol Hcl) .... Take 1 Po  Two Times A Day 4)  Amlodipine Besylate 10 Mg  Tabs (Amlodipine Besylate) .... Take 1 By Mouth Once Daily 5)  Hydralazine Hcl 100 Mg Tabs (Hydralazine Hcl) .... Take 1 By Mouth Three Times A Day 6)  Furosemide 40 Mg Tabs (Furosemide) .[name].. 1po Once Daily 7)  Lisinopril 20 Mg Tabs (Lisinopril) .[name].. 1 By Mouth Once Daily 8)  Tramadol Hcl 50 Mg Tabs (Tramadol Hcl) .[name].. 1po Four Times Per Day As Needed For Pain 9)  Actos 45 Mg Tabs (Pioglitazone Hcl) .[name].. 1 By Mouth Once Daily 10)  Freestyle Lite Test  Strp (Glucose Blood) .... Use Asd 1 Once Daily 11)  Lancets  Misc (Lancets) .... Use Asd 1 Once Daily 12)  Pravachol 20 Mg Tabs (Pravastatin Sodium) .[name].. 1po Once Daily 13)  Miralax  Powd (Polyethylene Glycol 3350) .Marland KitchenMarland KitchenMarland Kitchen 17 Gm in Water Once Daily 14)  Clonidine Hcl 0.1 Mg Tabs (Clonidine Hcl) .[name].. 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  Drug use-no  Risk Factors: Smoking Status: never (05/05/2007)  Past Medical History: Diabetes mellitus, type II GERD Hypertension Erectile Dysfunction Hx of Hepatitis C Morbid Obesity Allergic rhinitis Depression likely lumbar stenosis with radiculopathy/chronic LLE pain medical noncompliance right lacunar CVA 3/09 - Dr Hickling/neuro Nephrolithiasis, hx of - known left ureteral stone by CT 2011 Congestive heart failure - chronic systolic, EF 40%, Jan 2009 Renal insufficiency gallstone Benign prostatic hypertrophy  Social History: Never Smoked Alcohol use-no married (second time) lives alone no local family 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   Head:  normocephalic and atraumatic.   Eyes:  vision grossly intact, pupils equal, and pupils round.   Ears:  R ear normal and L ear normal.   Nose:  no external deformity and no nasal discharge.   Mouth:  no gingival abnormalities and pharynx pink and moist.   Neck:  supple and no masses.   Lungs:  normal respiratory effort and normal breath sounds.   Heart:  normal rate and regular rhythm.   Abdomen:  soft, non-tender, and normal bowel sounds.   Extremities:  1-2+ edema to knees , left > right, no ulcers   Impression & Recommendations:  Problem # 1:  CHOLELITHIASIS (ICD-574.20) prob mild sympt but  pt declines surgical referral, even after discussioin of risk assoc with no tx  Problem # 2:  DIABETES MELLITUS, TYPE II (ICD-250.00)  His updated medication list for this problem includes:    Glucotrol 10 Mg Tabs (Glipizide) .[name]... 1 by mouth two times a day    Adult Aspirin Low Strength 81 Mg Tbdp (Aspirin) .[name]... 1 by mouth qd    Lisinopril 20 Mg Tabs (Lisinopril) .[name]... 1 by mouth once daily    Actos 30 Mg Tabs (Pioglitazone hcl) .[name]... 1 by mouth once daily  Orders: TLB-A1C / Hgb A1C (Glycohemoglobin) (83036-A1C) TLB-Lipid Panel (80061-LIPID) stable overall by hx and exam, ok to continue meds/tx as is , Pt to cont DM diet, excercise, wt control efforts; to check labs today   Problem # 3:  PERIPHERAL EDEMA (ICD-782.3)  His updated medication list for this problem includes:    Furosemide 40 Mg Tabs (Furosemide) .[name]... 1po once daily to incr the lasix to 40 mg , which he is reluctant to do due o the diuretic effect  Discussed elevation of the legs, use of compression stockings, sodium restiction, and medication use.   Problem # 4:  HYPERTENSION (ICD-401.9)  His updated medication list for this problem includes:    Labetalol Hcl 300 Mg Tabs (Labetalol hcl) .[name]... Take 1 po  two times a day    Amlodipine Besylate 10 Mg Tabs  (Amlodipine besylate) .[name]... Take 1 by mouth once daily    Hydralazine Hcl 100 Mg Tabs (Hydralazine hcl) .[name]... Take 1 by mouth three times a day    Furosemide 40 Mg Tabs (Furosemide) .[name]... 1po once daily    Lisinopril 20 Mg Tabs (Lisinopril) .[name]... 1 by mouth once daily    Clonidine Hcl 0.1 Mg Tabs (Clonidine hcl) .[name]... 1 by mouth two times a day  BP today: 122/80 Prior BP: 140/92 (04/24/2010)  Labs Reviewed: K+: 5.0 (04/23/2010) Creat: : 2.5 (04/23/2010)   Chol: 178 (04/23/2010)   HDL: 42.00 (04/23/2010)   LDL: 114 (04/23/2010)   TG: 108.0 (04/23/2010) stable overall by hx and exam, ok to continue meds/tx as is   Complete Medication List: 1)  Glucotrol 10 Mg Tabs (Glipizide) .[name].. 1 by mouth two times a day 2)  Adult Aspirin Low Strength 81 Mg Tbdp (Aspirin) .[name].. 1 by mouth qd 3)  Labetalol Hcl 300 Mg Tabs (Labetalol hcl) .... Take 1 po  two times a day 4)  Amlodipine Besylate 10 Mg Tabs (Amlodipine besylate) .... Take 1 by mouth once daily 5)  Hydralazine Hcl 100 Mg Tabs (Hydralazine hcl) .... Take 1 by mouth three times a day 6)  Furosemide 40 Mg Tabs (Furosemide) .[name].. 1po once daily 7)  Lisinopril 20 Mg Tabs (Lisinopril) .[name].. 1 by mouth once daily 8)  Tramadol Hcl 50 Mg Tabs (Tramadol hcl) .[name].. 1po four times per day as needed for pain 9)  Actos 45 Mg Tabs (Pioglitazone hcl) .[name].. 1 by mouth once daily 10)  Freestyle Lite Test Strp (Glucose blood) .... Use asd 1 once daily 11)  Lancets Misc (Lancets) .... Use asd 1 once daily 12)  Pravachol 20 Mg Tabs (Pravastatin sodium) .[name].. 1po once daily 13)  Miralax Powd (Polyethylene glycol 3350) .Marland KitchenMarland Kitchen. 17 gm in water once daily 14)  Clonidine Hcl 0.1 Mg Tabs (Clonidine hcl) .[name].. 1 by mouth two times a day  Other Orders: TLB-BMP (Basic Metabolic Panel-BMET) (80048-METABOL)  Patient Instructions: 1)  increase the furosemide to 40 mg per day (sent to your pharmacy) 2)  Continue all previous medications as before this visit 3)  Please go to the  Lab in the basement for your blood and/or urine tests today 4)  Please call the number on the [facility] for results of your testing 5)  Please schedule a follow-up appointment in 6 months, or sooner if needed Prescriptions: FUROSEMIDE 40 MG TABS (FUROSEMIDE) 1po once daily  #90 x 3   Entered and Authorized by:   Corwin Levins MD   Signed by:   Corwin Levins MD on 08/01/2010   Method used:   Electronically to        [facility] [id number]* (retail)       [address] [place], [place]  [address]       Ph: [phone]       Fax: [phone]   RxID:   [id number]    Orders Added: 1)  TLB-BMP (Basic Metabolic Panel-BMET) [80048-METABOL] 2)  TLB-A1C / Hgb A1C (Glycohemoglobin) [83036-A1C] 3)  TLB-Lipid Panel [80061-LIPID] 4)  Est. Patient Level IV [59563]

## 2010-08-28 NOTE — Progress Notes (Signed)
  Phone Note Call from Patient   Caller: Patient Call For: Corwin Levins MD Summary of Call: Patient called with clarification on medication discussed at OV 04/23/10. The dosage for Clonidine is 0.1mg  and the patients pharmacy is [facility]. Call back number is [phone]. Initial call taken by: Robin Ewing CMA Duncan Dull),  April 23, 2010 1:36 PM  Follow-up for Phone Call        noted, will send refill Follow-up by: Corwin Levins MD,  April 23, 2010 3:27 PM

## 2010-09-03 NOTE — Consult Note (Signed)
Summary: [facility]  [facility]   Imported By: Lester Snead 08/28/2010 07:34:05  _____________________________________________________________________  External Attachment:    Type:   Image     Comment:   External Document

## 2010-09-18 ENCOUNTER — Telehealth: Payer: Self-pay | Admitting: Internal Medicine

## 2010-09-23 NOTE — Progress Notes (Signed)
  Phone Note Refill Request Message from:  Fax from Pharmacy on September 18, 2010 3:55 PM  Refills Requested: Medication #1:  FUROSEMIDE 40 MG TABS 1po once daily   Dosage confirmed as above?Dosage Confirmed   Last Refilled: 08/01/2010   Notes: [facility] special[facility] Pharm[facility]  Medication #2:  Sallyanne Kuster 10 MG  TABS 1 by mouth two times a day   Dosage confirmed as above?Dosage Confirmed   Last Refilled: 07/31/2010   Notes: [facility]  Medication #3:  LISINOPRIL 20 MG TABS 1 by mouth once daily   Dosage confirmed as above?Dosage Confirmed   Last Refilled: 07/31/2010   Notes: [facility] Initial call taken by: Zella Ball Ewing CMA Duncan Dull),  September 18, 2010 3:57 PM    Prescriptions: LISINOPRIL 20 MG TABS (LISINOPRIL) 1 by mouth once daily  #90 x 3   Entered by:   Scharlene Gloss CMA (AAMA)   Authorized by:   Corwin Levins MD   Signed by:   Scharlene Gloss CMA (AAMA) on 09/18/2010   Method used:   Faxed to ...       [facility]* (retail)       [address]       [place], [place]  [address]       Ph: [phone]       Fax: [phone]   RxID:   [id number] FUROSEMIDE 40 MG TABS (FUROSEMIDE) 1po once daily  #90 x 3   Entered by:   Zella Ball Ewing CMA (AAMA)   Authorized by:   Corwin Levins MD   Signed by:   Scharlene Gloss CMA (AAMA) on 09/18/2010   Method used:   Faxed to ...       [facility]* (retail)       [address]       [place], [place]  [address]       Ph: [phone]       Fax: [phone]   RxID:   [id number] GLUCOTROL 10 MG  TABS (GLIPIZIDE) 1 by mouth two times a day  #180 x 1   Entered by:   Scharlene Gloss CMA (AAMA)   Authorized by:   Corwin Levins MD   Signed by:   Scharlene Gloss CMA (AAMA) on 09/18/2010   Method used:   Faxed to ...       [facility]* (retail)       [address]       [place], [place]  [address]       Ph: [phone]       Fax: [phone]   RxID:   [id number]

## 2010-10-29 ENCOUNTER — Encounter: Payer: Self-pay | Admitting: Internal Medicine

## 2010-11-26 ENCOUNTER — Other Ambulatory Visit: Payer: Self-pay | Admitting: Internal Medicine

## 2010-12-09 NOTE — Discharge Summary (Signed)
NAME:  Oscar Castillo, Oscar Castillo                 ACCOUNT NO.:  [id number]   MEDICAL RECORD NO.:  [id number]          PATIENT TYPE:  INP   LOCATION:  5525                         FACILITY:  [facility]   PHYSICIAN:  Valerie A. Felicity Coyer, MDDATE OF BIRTH:  [birth year]   DATE OF ADMISSION:  08/23/2007  DATE OF DISCHARGE:  09/05/2007                               DISCHARGE SUMMARY   DISCHARGE DIAGNOSES:  1..  Hypertensive urgency.  1. Gait abnormality with plans to go to [facility] for      additional rehabilitation prior to discharge to home.  2. Abnormal EKG secondary to severe hypertension, status post      cardiology evaluation during this admission.  3. Chronic systolic heart failure.  A 2-D echo performed August 25, 2007, noted left ventricular ejection fraction of 40%.  The patient      is clinically compensated at this time.  4. E coli urinary tract infection of final sensitivities are pending.  5. Diabetes type 2 uncontrolled.  6. Elevated amylase will need follow up as outpatient.  No clear      etiology at this time.  7. Acute renal insufficiency in the setting of hypertensive urgency,      stable.   HISTORY OF PRESENT ILLNESS:  Mr. Lupinski is a 60 year old male who was  admitted on August 23, 2007, with chief complaint of weakness and  dizziness.  He has a history of noncompliance, and apparently became  weak and dizzy upon standing up from the bed on the morning of  admission.  He stated that he fell back into his bed due to the weakness  in his legs.  He noted that he was noncompliant with meds largely to  financial reasons.  He denied a chest pain, but did note some shortness  of breath off-and-on for 6 months prior to this admission.  He was  admitted for further evaluation and treatment.   IMPRESSION:  1. Partial right nephrectomy.  2. Hypertension.  3. GERD.  4. Diabetes type 2.  5. Hep C.  6. Erectile dysfunction and section course of hospitalization.   COURSE  OF HOSPITALIZATION:  Problem #1.  Hypertensive urgency with  question of TIA symptoms.  The patient was admitted and underwent a CT  of the head which was negative for acute changes.  MRI of the brain  noted no acute findings did not atrophy advance for age and noted  paranasal sinus opacifications.  The patient was seen in consultation by  Dr. Ellison Carwin who recommended continued blood pressure control.  The patient's blood pressure medications were titrated upward and  maximized during this admission.  He did undergo an MRA of the abdomen  to rule out renal artery stenosis which was negative.  Urine has been  sent for catecholamines, and metanephrines and results are currently  pending at the time of this dictation.  The patient was also seen by  [facility] cardiology during this admission for assistance with blood  pressure control and evaluation of abnormal EKG which was thought to be  due to severe hypertension.   Problem #2:  Diabetes type 2.  The patient's blood sugars were elevated.  His metformin was discontinued, due to his renal insufficiency.  He was  transitioned over to insulin 70/30  for hopeful improved compliance  after discharge with pricing as the patient's sugars were not going to  be well controlled with sulfonylurea only, and the other medications are  unaffordable.  Sugars at time of discharge are improved, and are  currently under 200 so they continued outpatient monitoring.  The  patient will need assistance with prescriptions for the $4 [employer] plan  at time of discharge.   Problem #3:  E. coli UTI.  The patient will be started on Ceftin.  Final  culture data is pending at the time of this dictation.   PHYSICAL EXAM:  VITAL SIGNS:  BP 176/101, heart rate 70, respiratory  rate 16, temperature 98.8, O2 saturation 100% on room air.  GENERAL: The patient is an African-American male who is awake and alert  in no acute distress.  HEENT:  Poor dentition with  multiple missing teeth.  CARDIOVASCULAR:  S1-S2 regular rate and rhythm.  LUNGS:  Clear to auscultation bilaterally without wheezes, rales, or  rhonchi.  ABDOMEN:  Soft, nontender, nondistended.  Positive bowel sounds are  noted without tenderness.  EXTREMITIES:  2+ bilateral lower extremity edema.  NEURO:  Patient is moving all extremities.  He has a slight left eyelid  droop, otherwise positive facial symmetry.  Speech is clear.   PERTINENT LABORATORIES:  At time of discharge, urine culture 100,000 of  E-coli.  Hemoglobin 13.8 hematocrit 42.4 creatinine 1.29.   DISPOSITION:  The patient will be discharged to [facility] inpatient  rehab pending availability today.   FOLLOWUP:  Upon discharge as needed.  The patient will need follow up  with his primary care Josephene Marrone, Dr. Oliver Barre.  However, as the patient  apparently seems to have severe financial constraints, he may need to be  referred to [facility] if he feels that he is unable to afford  follow up with private physician.      Sandford Craze, NP      Raenette Rover. Felicity Coyer, MD  Electronically Signed    MO/MEDQ  D:  09/05/2007  T:  09/06/2007  Job:  [id number]

## 2010-12-09 NOTE — Discharge Summary (Signed)
NAMEMarland Castillo  VASIL, JUHASZ NO.:  [id number]   MEDICAL RECORD NO.:  [id number]          PATIENT TYPE:  IPS   LOCATION:  4155                         FACILITY:  [facility]   PHYSICIAN:  Ranelle Oyster, M.D.DATE OF BIRTH:  1951-04-30   DATE OF ADMISSION:  09/05/2007  DATE OF DISCHARGE:  09/28/2007                               DISCHARGE SUMMARY   DISCHARGE DIAGNOSES:  1. Lacunar cerebrovascular accident.  2. Hypertension.  3. Diabetes mellitus.  4. Depression.  5. Escherichia coli urinary tract infection, resolved.  6. History of medical noncompliance.  7. Subcutaneous Lovenox for deep vein thrombosis prophylaxis.   This is a 60 year old black male, history of hypertension, diabetes  mellitus with poor medical compliance, who was admitted January 27 with  slurred speech, generalized weakness.  Cranial CT scan and MRI negative.  Carotid Dopplers showed no internal carotid artery stenosis.  Echocardiogram with ejection fraction 35-50% without emboli.  Neurology  consulted, Dr. Sharene Skeans.  Questionable small lacunar infarction.  Maintained on aspirin and subcutaneous Lovenox.  Blood pressures  variable on multiple antihypertensive medications.  He had been placed  on Ceftin February 9 for E, coli urinary tract infection.  He was  admitted for a comprehensive rehab program.   PAST MEDICAL HISTORY:  1. Hypertension.  2. Non-insulin dependent diabetes mellitus.  3. Gastroesophageal reflux disease.  4. Hepatitis C.  5. Kidney stones.  6. Erectile dysfunction.   He denies any alcohol or tobacco.   ALLERGIES:  None.   SOCIAL HISTORY:  Divorced, lives alone, unemployed.  One level home,  three steps to entry.  No local family that is willing to assist on his  discharge.  He does have some close friends in the area.   MEDICATIONS PRIOR TO ADMISSION:  1. Glucotrol 10 mg twice daily.  2. Aldactone 25 mg twice daily.  3. Glucophage 1000 mg daily.  4. Aspirin 81 mg  daily.  5. Atenolol 50 mg daily.   With questionable medical compliance.   REHABILITATION HOSPITAL COURSE:  The patient was admitted to inpatient  rehab services with therapies initiated on a 3-hour daily basis  consisting of physical therapy, occupational therapy and rehabilitation  nursing.  The following issues were addressed during the patient's  rehabilitation stay.  Pertaining to Mr. Mutch' lacunar  infarction, the  patient progressing with rehab services.  Maintained on aspirin therapy  daily.  Functionally he was supervision to modified independence for his  basic transfers, modified independent for car transfers, ambulating  supervision 200 feet, supervision for upper and lower body dressing.  He  had been on subcutaneous Lovenox throughout his rehab stay for deep vein  thrombosis prophylaxis.  Noted uncontrolled hypertension on multiple  antihypertensive medications, initially seen by [facility]  with medications made accordingly with last changes of hydralazine  changed to 100 mg every 6 hours on September 26, 2007.  His labetalol had  been increased to 300 mg every 8 hours on September 25, 2007.  He did receive  an MRA of the abdomen with contrast to rule out renal artery stenosis on  September 01, 2007, that was negative.  His latest diastolic pressures  were in the low to mid 90s.  He denied any headache or dizziness.  Blood  sugars again variable.  He had a hemoglobin A1c of 9.  He had since been  started on 70/30 insulin and monitored closely.  There was a noted  history of poor medical compliance with both his hypertension and his  diabetes mellitus.  He was on Prozac for history of depression with  emotional support provided throughout his course.  Given the fact the  patient and limited support at home, it was felt that a rest home would  be needed with a bed becoming available on September 28, 2007, and discharge  taking place at that time.   Latest labs showed a sodium  137, potassium 4.2, BUN 21, creatinine 1.27.  Latest hemoglobin 14.8, hematocrit 44, WBC of 5.4, platelet 384,000.   DISCHARGE MEDICATIONS AT TIME OF DICTATION:  Aspirin 81 mg p.o. daily.  1. Lisinopril 40 mg p.o. b.i.d.  2. Norvasc 10 mg p.o. daily.  3. Prozac 20 mg p.o. daily.  4. Glipizide 10 mg p.o. b.i.d.  5. Imdur-SA 120 mg p.o. daily.  6. Lasix 20 mg p.o. b.i.d.  7. Labetalol 300 mg p.o. every 8 hours.  8. Hydralazine 100 mg p.o. every 6 hours.  9. Insulin NovoLog mix 70/30 5 units subcutaneously twice daily.  10.Clonidine patch TTS-3 0.3 mg patch, change every Monday.  11.Tylenol 650 mg p.o. every 4 hours as needed pain or fever.   DIET:  Diabetic diet.   SPECIAL INSTRUCTIONS:  The patient should follow up with Dr. Jonny Ruiz of  [facility] for ongoing monitoring of hypertension  and medical management.  Dr. Faith Rogue at the outpatient rehab  service office as advised.      Mariam Dollar, P.A.      Ranelle Oyster, M.D.  Electronically Signed    DA/MEDQ  D:  09/27/2007  T:  09/27/2007  Job:  [id number]   cc:   Corwin Levins, MD

## 2010-12-09 NOTE — Consult Note (Signed)
NAMEMarland Kitchen  Oscar Castillo, Oscar Castillo NO.:  [id number]   MEDICAL RECORD NO.:  [id number]          PATIENT TYPE:  INP   LOCATION:  3301                         FACILITY:  [facility]   PHYSICIAN:  Veverly Fells. Excell Seltzer, MD  DATE OF BIRTH:  05/01/1951   DATE OF CONSULTATION:  08/25/2007  DATE OF DISCHARGE:                                 CONSULTATION   BRIEF HISTORY:  Oscar Castillo is a 60 year old African-American male who was  transported via EMS to [facility] emergency room secondary to  neurological changes.  He was admitted with hypertensive urgency as  well.  EMS report is not available.  Since admission, he has been  treated with IV nitrates and additional p.o. medications with some  improvement in his blood pressure.  We were asked to consult secondary  to abnormal EKG.   Oscar Castillo states that he has felt fine recently; however, on the day of  admission, he was watching TV and got up from the chair and became very  dizzy.  He stated that he found it very difficult to walk and he had a  bad balance.  He actually fell to the ground.  He is not clear if he  lost consciousness.  He called a friend, who came over and gave him a  sugar pill and some food because the patient thought that it was related  to low sugar; however due to persistence of symptoms, he called 911.  EMS report is not available.  The patient thinks these  symptoms lasted  at least 15 minutes.  The patient also states he thought he was dying.  He did not have any improvement with the sugar pill and/or food and  denies prior occurrences.   PAST MEDICAL HISTORY:  NO KNOWN DRUG ALLERGIES.  He is not clear to what  medications he is taking at home.  He thinks he is taking atenolol 50  daily, glipizide 10 b.i.d., and aspirin 81 daily.  According to [employer], he is also supposed to be taking benazepril 40 daily,  Aldactone 20 daily, Actos 30 daily.  The patient states he also takes a  Garlique tablet.   He has a  long history of hypertension.  He does not check his blood  pressure regularly.  He also has a history of unknown duration diabetes,  which he also does not check his sugars regularly.  He has a history of  GERD, hepatitis C, erectile dysfunction, noncompliance secondary to  finances.  He does state that the last time he saw his  primary care  physician, Oscar Castillo, was in October 2008.  He describes some type  of kidney surgery when he was 60 years old in [place].  He has a  history of kidney stones.  He specifically denies myocardial infarction,  CVA, COPD, thyroid dysfunction, bleeding  dyscrasias, hyperlipidemia.  He resides in [place] alone in his apartment.  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  He denies tobacco, alcohol,  drugs, specific diet or exercise.   FAMILY HISTORY:  His  mother is deceased in her 93s.  She was in a coma.  Did have a history of diabetes.  His father died in his 79s with  myocardial infarction, history of hypertension.  He has two brothers  deceased.  One of them drowned, another one he thinks may have had a  heart attack.  One sister deceased, unknown causes.  He has one brother,  three sisters who are living, unknown health.   REVIEW OF SYSTEMS:  In addition to the above is notable for glasses,  very poor dentition,  dry skin.  He has chronic dyspnea on exertion that  has not changed in at least one year.  He does have occasional edema in  his lower extremities which decreases at night, nonproductive cough,  urinary straining, depression secondary to his health issues and  finances.  All other systems are unremarkable.   PHYSICAL EXAMINATION:  GENERAL:  Well-nourished, well-developed,  pleasant African American male in no acute distress.  VITAL SIGNS:  Temperature is 98.9, blood pressure 163/115, pulse 67,  respirations 19.  Admission weight was 104.327 kg, today is 98.8 kg.  Is  and Os have been 18, 45 and 400.   Telemetry showed normal sinus rhythm.  T-max was 99.1 early this morning.  HEENT:  Unremarkable except for poor dentition.  NECK:  Supple without thyromegaly, adenopathy, JVD or carotid bruits.  CHEST:  Symmetrical excursion.  Decreased breath sounds, particularly at  the bases, but clear to auscultation.  HEART:  Regular rate and rhythm.  He does have a split S2 that varies  with respirations.  I did not appreciate any murmurs, rubs, clicks or  gallops.  All pulses are symmetrical and intact without any abdominal or  femoral bruits.  SKIN:  Integument is intact, is very dry.  ABDOMEN:  Rounded.  Bowel sounds present, without organomegaly, masses  or tenderness.  EXTREMITIES:  Negative cyanosis, clubbing or edema.  MUSCULOSKELETAL/NEUROLOGIC:  Unremarkable.   Chest x-ray on admission showed left lower lobe atelectasis versus  pneumonia.  Head CT did not show any acute findings, chronic small  vessel white matter disease.  MRI showed no acute findings, advanced  atrophy given his age.  Possible right cyst in his sinuses.  EKG showed  normal sinus rhythm, nonspecific ST/T wave changes.  He has marked LVH  with repolarization changes and bilateral atrial enlargement.  His LVH  repolarization changes are new since June 07, 2000.  H&H on  admission was 17 and 51.8, normal indices, platelets 253, WBC  8.5,  sodium 136, potassium 3.8, BUN 21, creatinine 1.84, glucose 279, total  bilirubin 1.9, alkaline phosphatase 80, AST 44, ALT 33.  TSH 0.72.  Hemoglobin A1c was 9.  BNP was elevated at 946.  Amylase was elevated at  236, lipase 47.  CK MBs, relative indices  were unremarkable.  Troponins  were 0.05, 0.08 and 0.08.  Urinalysis was notable for blood, ketones,  glucose, protein and total bilirubin.  Fasting lipids showed a total  cholesterol 143, triglycerides 65, HDL 32, LDL 98.  Today's labs show  H&H of 15 and 46.4, platelets 242, WBCs 7.6, sodium 136, potassium 3.7,  BUN 23,  creatinine 1.8, glucose 156.   IMPRESSION:  1. Hypertensive urgency.  On presentation to the emergency room, his      blood pressure was 243/135, associated with neuro changes.      Neurology following.  2. Hyperglycemia with a history of diabetes.  Elevated hemoglobin A1c  is evidence of poor control.  3. Slightly worsening renal function compared to October 2008, when      BUN and creatinine were 18 and 1.6.  4. Abnormal EKG representative of left ventricular hypertrophy and      repolarization changes.  He does not have any active evidence of      ischemia.  Slight elevation of troponin, probably secondary to      renal insufficiency, probable mild CHF secondary to hypertension;      however, he has lost based on documented weight approximately 5.5      kg.  5. Noncompliance.   RECOMMENDATIONS:  Dr. Excell Seltzer has reviewed the patient's history, spoke  with and examined the patient and agrees with the above.  We will review  his echocardiogram as this has been done but interpretation is pending.  We  will add nitrates in combination with hydralazine as well as favoring  restarting ACE inhibitor such as lisinopril for hypertensive control.  Case management should be contacted to assist with discharge needs given  his finances.  We will make further recommendations post echo review and  response to blood pressure therapy.      Joellyn Rued, PA-C      Veverly Fells. Excell Seltzer, MD  Electronically Signed    EW/MEDQ  D:  08/25/2007  T:  08/25/2007  Job:  [id number]   cc:   Veverly Fells. Excell Seltzer, MD

## 2010-12-09 NOTE — Assessment & Plan Note (Signed)
Oscar Castillo is back regarding his right lacunar stroke.  He is at [facility] currently.  He seems to be doing fairly well.  He is  receiving therapy there.  They are recommending that he move to  outpatient services.  The nurse there had some questions regarding his  blood pressure, as it has been high.  Oscar Castillo states that it actually has  been improved, is still high in the morning, although he attributes that  to the cuff they are using.  His sugar has been under fair control.  Oscar Castillo remains on medications we discharged him on, which are many.  He  continues on Norvasc, lisinopril, Catapres, Lasix, Labetalol and  hydralazine for blood pressure control.  He is on Darvocet p.r.n. for  breakthrough pain in his left leg and back.  He complains of some loose  stool, is related to his twice a day Senokot S.  His back and leg have  been continued problems as opposed to the knee, which he felt was the  problem initially and he feels that it is more of a radiating pain from  his back and a sciatica.   REVIEW OF SYSTEMS:  Notable for above.  He is using his walker for  ambulation.  For the most part, he has been pleased with his progress.   SOCIAL HISTORY:  Without significant change.  He hopes to move back to  his apartment soon.   PHYSICAL EXAMINATION:  VITAL SIGNS:  Blood pressure 138/86, pulse 76,  respiratory rate 18, satting 100% on room air.  GENERAL:  The patient is pleasant, alert and oriented x3.  Affect is  bright and appropriate.  He walked with his walker today.  His gait still seems to favor the left  side.  He is stable, however, with his walker and did not show any signs  of instability.  Left leg sensation is 1 out of 2 as is the arm.  I rate  his motor function in the left arm and leg at 4, perhaps 4+ out of 5.  Right side is 5/5.  No focal cranial nerve findings were noted today.  Cognitively, he seemed to be generally appropriate.  The patient did  have some pain  with straight leg raising and seated slump testing today  on the left side.  He had palpable tenderness with palpation of the  lower back.   ASSESSMENT:  1. Right lacunar stroke.  2. Hypertension, which has been resistant to medications.  3. Diabetes.  4. Depression.  5. Likely lumbar spondylosis with radiculopathy.   PLAN:  1. Will transition the patient over to outpatient PT to work on gait,      as well as low back range of motion and extremity exercises with      modality as indicated.  Before we go working up the back any      further, I think we should stay conservative first, as he has had      no specific therapy for the back or leg.  2. Recommend checking blood pressure with a manual cuff.  The      patient's blood pressure was near normal today.  3. Change insulin checks to daily, staggered schedule as they have      been fairly stable over the last month.  4. Hold Senokot S for now and perhaps resume it once a day depending      on his bowel habits.  5. I will  see Oscar Castillo back in about 2 months' time.      Ranelle Oyster, M.D.  Electronically Signed     ZTS/MedQ  D:  11/23/2007 13:42:54  T:  11/23/2007 14:06:23  Job #:  [id number]

## 2010-12-09 NOTE — Discharge Summary (Signed)
NAMEMarland Castillo  DAIVD, FREDERICKSEN NO.:  [id number]   MEDICAL RECORD NO.:  [id number]          PATIENT TYPE:  IPS   LOCATION:  4155                         FACILITY:  [facility]   PHYSICIAN:  Ranelle Oyster, M.D.DATE OF BIRTH:  [birth year]   DATE OF ADMISSION:  09/05/2007  DATE OF DISCHARGE:                               DISCHARGE SUMMARY   PRIMARY CARE PHYSICIAN:  Corwin Levins, M.D.   HISTORY OF THE PRESENT ILLNESS:  Mr. Oscar Castillo is a 60 year old African  American male with a history of hypertension and non-insulin diabetes  mellitus, with poor medical compliance.   The patient was admitted August 23, 2007 with slurred speech and  generalized weakness.  Initial cranial CT and MRI study of the brain  were negative for acute abnormalities.  Carotid Dopplers showed no  internal carotid artery stenosis.  Echocardiogram showed ejection  fraction of 35%-50%, without emboli.   The patient was seen by Dr. Sharene Skeans from neurology, and he felt the  patient had a small lacunar infarct.  The patient was maintained on  aspirin and subcutaneous Lovenox.  His blood pressures have been labile,  and his primary care physician has treated with multiple blood pressure  medicines.  The patient has orders for a 24-hour urine, with results  pending at this time.  He does have an Escherichia coli urinary tract  infection and is on Ceftin, with sensitivities still pending.   The patient was evaluated by the rehabilitation physicians and felt to  be an appropriate candidate for inpatient rehabilitation.   REVIEW OF SYSTEMS:  Positive for reflux.   PAST MEDICAL HISTORY:  1. Hypertension.  2. Non-insulin-dependent diabetes mellitus.  3. Gastroesophageal reflux disease.  4. Hepatitis C  5. Erectile dysfunction.  6. Kidney stones.   FAMILY HISTORY:  Positive for diabetes mellitus and coronary artery  disease.   SOCIAL HISTORY:  The patient is divorced and lives alone and is  unemployed.  He  lives in a one-level home, with three steps to enter.  Local family can assist, but several do attend school.  There is no  family that can assist on a 24/7 basis.  The patient does not use  alcohol or tobacco.   FUNCTIONAL HISTORY PRIOR TO ADMISSION:  Independent.   ALLERGIES:  No known drug allergies.   MEDICATIONS PRIOR TO ADMISSION:  1. Glucotrol 10 mg b.i.d.  2. Aldactone 25 mg b.i.d.  3. Glucophage 1000 mg daily.  4. Aspirin 81 mg daily.  5. Atenolol 50 mg daily.   LABORATORY:  Recent hemoglobin was 13.8, with hematocrit of 42.4,  platelet count of 273,000, with a white count of 6.9.  Recent CBGs have  been 116, 165, and 150.  Hemoglobin A1c was elevated at 9.  Recent  sodium was 130, potassium 4.1, chloride 101, bicarbonate 23, BUN 22, and  creatinine 1.29.   PHYSICAL EXAMINATION:  GENERAL:  Reasonably well-appearing, middle-aged  adult male, seated up in a chair, in no acute discomfort.  VITAL SIGNS:  Blood pressure 176/101, with pulse 70, respiratory rate  16, and O2 saturation 99%  on room air, with temperature 98.9.  HEENT: Normocephalic, nontraumatic.  CARDIOVASCULAR:  Regular rate and rhythm, S1, S2 without murmurs.  ABDOMEN:  Soft, obese, nontender, with positive bowel sounds.  LUNGS:  Clear to auscultation bilaterally.  NEUROLOGIC:  Alert and oriented x3.  Cranial nerves II-XII are intact.  MUSCULOSKELETAL:  Bilateral upper extremity exam showed 4-/5 strength  throughout.  Bulk and tone were normal.  Reflexes were 2+ and  symmetrical.  Lower extremity exam showed 4-/5 strength throughout.  Bulk and tone were normal, and sensation was intact to light touch.  The  patient has slight 1-2+ edema in his lower extremities.   IMPRESSION:  1. Status post lacunar infarct, with mild diffuse weakness.  2. Hypertension, on multiple medications.  3. Diabetes mellitus, with elevated hemoglobin A1c on admission.  4. Escherichia coli urinary tract infection.  5. Medical  noncompliance.  6. Depression, on Prozac.   Presently, the patient has deficits in ADLs, transfers, and ambulation  related to the above-noted lacunar infarcts.   PLAN:  1. Admit to the rehabilitation unit for daily therapies to include      physical therapy for range of motion, strengthening, bed mobility,      transfers, pre-gait training, gait training, and equipment      evaluation.  2. Occupational therapy for range of motion, strengthening, ADLs,      cognitive/perceptual training, splinting, and equipment evaluation.  3. Rehab nursing for skin care, wound care, and bowel and bladder      training as necessary.  4. Case management to assess home environment, assist with discharge      planning, and arrange for appropriate follow-up care.  5. Social work to assess family and social support, consultation      regarding disability issues, and assist in discharge planning.  6. High-carbohydrate modified diet.  7. Check admission labs, including CBC and CMET in a.m., September 06, 2007.  8. Tylenol 325 mg 1-2 tablets p.o. q.4 h. p.r.n. for pain.  9. Ceftin 500 mg p.o. b.i.d. x7 days for urinary tract infection.  10.Senokot-S 2 tablets p.o. at bedtime.  11.Glipizide 10 mg p.o. b.i.d.  12.Aspirin 81 mg p.o. daily.  13.Atenolol 50 mg p.o. b.i.d.  14.Hydralazine 100 mg p.o. t.i.d., holding for systolic blood pressure      less than 120.  15.Imdur 120 mg p.o. daily.  16.Subcutaneous Lovenox 40 mg daily.  17.Lisinopril 40 mg p.o. b.i.d.  18.Norvasc 10 mg p.o. daily.  19.Clonidine 0.3 mg t.i.d.  20.Prozac 20 mg daily.  21.70/30 insulin 20 units subcutaneously b.i.d.  22.Discontinue IV fluids if not already done.  23.Routine turning to prevent skin breakdown.  24.NovoLog sliding scale insulin starting at blood sugar of 201 and      followed as ordered.  25.CBG a.c. and at bedtime.  26.Incentive spirometry q.2 h. while awake.  27.In-and-out catheterizations p.r.n. for no void  in 8 hours or      postvoiding residuals greater than 300.   PROGNOSIS:  Good.   ESTIMATED LENGTH OF STAY:  5-7 days.   GOALS:  Modified independent, ADLs, transfers, and ambulation.     ______________________________  Ellwood Dense, M.D.      Ranelle Oyster, M.D.  Electronically Signed    DC/MEDQ  D:  09/05/2007  T:  09/06/2007  Job:  [id number]

## 2010-12-09 NOTE — Consult Note (Signed)
NAME:  Oscar Castillo, Oscar Castillo NO.:  [id number]   MEDICAL RECORD NO.:  [id number]          PATIENT TYPE:  EMS   LOCATION:  MAJO                         FACILITY:  [facility]   PHYSICIAN:  Deanna Artis. Hickling, M.D.DATE OF BIRTH:  28-Aug-1950   DATE OF CONSULTATION:  08/23/2007  DATE OF DISCHARGE:                                 CONSULTATION   CHIEF COMPLAINT:  Dizzy, cannot walk.   HISTORY OF PRESENT ILLNESS:  A 60 year old with a 2-year history of  diabetes mellitus, which is uncontrolled, and also hypertension that is  uncontrolled.  He is not compliant with medicines.  Around 11 a.m. as he  was getting something to eat, he suddenly felt dizzy and fell to the  cot.  He had difficulty walking.  EMS was called and found the blood  pressure 235/135. Glucose was ranging from 135 to 235.  En route, they  perceived weakness of the left eye with external deviation of it.   PAST MEDICAL HISTORY:  1. Hypertension.  2. Diabetes.   PAST SURGICAL HISTORY:  Kidney operation for adhesions surrounding the  kidney and also for kidney stones.   MEDICATIONS:  These are on hold at [facility].  They include:  1. Benazepril 40 mg daily.  2. Atenolol 50 mg daily.  3. Aldactone 20 mg daily.  4. Glipizide 10 mg twice daily.  5. Actos 30 mg daily.   DRUG ALLERGIES:  None known.   SOCIAL HISTORY:  The patient does not smoke, drink alcohol, use drugs.  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  He was laid off.  He lives alone.   FAMILY HISTORY:  Father died of complications of stroke in his 14s.  Mother died of unknown causes.   REVIEW OF SYSTEMS:  Negative except for his current symptoms.   PHYSICAL EXAMINATION:  VITAL SIGNS:  Blood pressure 238/128, resting  pulse 95, respirations 18, oxygen saturation 100%, temperature 97.  EARS, NOSE, THROAT:  Poor dentition.  NECK:  No bruits.  LUNGS:  Clear.  HEART:  No murmurs, pulses normal.  ABDOMEN:  Soft.  Bowel sounds normal.  No hepatosplenomegaly.  EXTREMITIES:  Normal.  NEUROLOGIC:  Dysarthric.  Mild left central seventh.  Left eye ambiopia.  Visual fields full.  Extraocular movements full.  Symmetric facial  strength.  Midline tongue.  Motor Examination:  Normal strength in his  arms and legs.  He has some giveaway proximally in his legs.  Finger-to-  nose and rapid repetitive movements are acceptable.  Gait not tested.  He is areflexic.  He has bilateral flexor plantar responses.   NIH stroke scale was 2.  Previous modified Rankin scale was zero.  I  cannot rule out a lacunar infarction in his brainstem or his subcortical  white matter, but her is not a candidate for t-PA.  I recommend blood  pressure control to get his blood pressure down to the 200/100 range.  MRI scan should be carried out this afternoon.  I will continue to  follow along with you.      Deanna Artis.  Sharene Skeans, M.D.     [facility]  D:  08/23/2007  T:  08/23/2007  Job:  [id number]   cc:   Corwin Levins, MD

## 2010-12-09 NOTE — Discharge Summary (Signed)
NAME:  Oscar Castillo, Oscar Castillo                 ACCOUNT NO.:  [id number]   MEDICAL RECORD NO.:  [id number]          PATIENT TYPE:  INP   LOCATION:  5525                         FACILITY:  [facility]   PHYSICIAN:  Valerie A. Felicity Coyer, MDDATE OF BIRTH:  1950-09-25   DATE OF ADMISSION:  08/23/2007  DATE OF DISCHARGE:                               DISCHARGE SUMMARY   ADDENDUM TO DISCHARGE SUMMARY:  Addendum to dictation September 05, 2007.   DISCHARGE MEDICATIONS:  1. Ceftin 500 mg p.o. b.i.d.  2. Glucotrol 10 mg p.o. b.i.d.  3. Aspirin 81 mg p.o. daily.  4. NovoLog sliding scale coverage t.i.d. a.c. and h.s. per protocol.  5. Tenormin 50 mg p.o. b.i.d.  6. Hydralazine 100 mg p.o. t.i.d.  7. Isosorbide mononitrate 120 mg p.o. daily.  8. Lisinopril 40 mg p.o. b.i.d.  9. Norvasc 10 mg p.o. daily.  10.Catapres 0.3 mg p.o. t.i.d.  11.Prozac 20 mg p.o. daily for depression.  12.Insulin 70/30, 20 units subcutaneous b.i.d.  13.Tylenol 650 mg p.o. q.4 h.  14.Zofran 4 mg IV q.8 h. p.r.n.  15.Ambien 5 mg p.o. q.h.s. p.r.n.  16.Catapres 0.1 mg p.o. b.i.d. p.r.n.  17.Phenergan 25 mg IV q.8 h. p.r.n.      Sandford Craze, NP      Raenette Rover. Felicity Coyer, MD  Electronically Signed    MO/MEDQ  D:  09/05/2007  T:  09/05/2007  Job:  [id number]

## 2010-12-09 NOTE — Discharge Summary (Signed)
NAMEMarland Kitchen  ATHAN, CASALINO NO.:  [id number]   MEDICAL RECORD NO.:  [id number]          PATIENT TYPE:  IPS   LOCATION:  4155                         FACILITY:  [facility]   PHYSICIAN:  Ranelle Oyster, M.D.DATE OF BIRTH:  1950-12-26   DATE OF ADMISSION:  09/05/2007  DATE OF DISCHARGE:  09/20/2007                               DISCHARGE SUMMARY   DISCHARGE DIAGNOSES:  1. Lacunar infarction.  2. Uncontrolled hypertension.  3. Diabetes mellitus.  4. Depression.  5. Escherichia coli urinary tract infection resolved.  6. Medical noncompliance.  7. Subcutaneous Lovenox for deep vein thrombosis prophylaxis.   This 60 year old black male with history of hypertension and diabetes  mellitus with poor medical compliance admitted January 27 with slurred  speech and generalized weakness.  Cranial CT scan, MRI were negative.  Carotid Doppler showed no internal carotid artery stenosis.  Echocardiogram with ejection fraction 35-50% without emboli.  Neurology  service, Dr. Sharene Skeans, questionable small lacunar infarction.  Maintained on aspirin and subcutaneous Lovenox.  Blood pressures  variable on multiple antihypertensive medications with assistance by  [facility] for blood pressure monitoring.  He had been placed  on Ceftin for an E. coli urinary tract infection resolved.   PAST MEDICAL HISTORY:  See Discharge Diagnoses.  No alcohol or tobacco.   ALLERGIES:  None.   SOCIAL HISTORY:  Divorced, lives alone, unemployed, one level home,  three steps to entry.  Local friend to assist as needed, but also  attends school.  No local family that will assist.   MEDICATIONS PRIOR TO ADMISSION:  1. Glucotrol 10 mg twice daily.  2. Aldactone 25 mg twice daily.  3. Glucophage 1000 mg daily.  4. Aspirin 81 mg daily.  5. Atenolol 50 mg daily.   REHABILITATION HOSPITAL COURSE:  The patient was admitted to inpatient  rehab services with therapies initiated on a three hour daily  basis  consisting of physical therapy, occupational therapy, speech therapy,  and rehabilitation nursing.  The following issues were addressed during  patient's rehabilitation stay.  Pertaining to Mr. Scalise lacunar  infarction it remained stable.  Maintained on aspirin therapy.  Functionally he was supervision with a rolling walker 220 feet,  supervision for dynamic standing, modified independent for basic car  transfers, modified independent wheelchair mobility, supervision for  activities of daily living.  He was advised no driving.  Blood pressures  continued to have some variables with noted history of noncompliance in  the past.  He was on multiple antihypertensive medications with recent  adjustments in his clonidine and hydralazine two every eight hours on  September 19, 2007.  He would need close observation by [facility].  Again it was stressed to the patient the need to follow  up medically for his hypertension as well as his diabetes mellitus with  a hemoglobin A1c of 9 upon admission.  He was now on insulin therapy.  He remained on Prozac for history of depression with emotional support  provided.  He completed a course of Ceftin for a E. coli urinary tract  infection.  LATEST LABORATORIES:  Hemoglobin 14.8, hematocrit 44, platelets 384,000.  Chemistries were unremarkable.  The patient was discharged to home.   DISCHARGE MEDICATIONS AT TIME OF DICTATION:  1. Aspirin 81 mg daily.  2. Lisinopril 40 mg twice daily.  3. Norvasc 10 mg daily.  4. Hydralazine 100 mg every eight hours.  5. Clonidine 0.3 mg every eight hours.  6. Prozac 20 mg daily.  7. Glipizide 10 mg twice daily.  8. Imdur SA 120 mg daily.  9. Hydrochlorothiazide 25 mg daily.  10.Labetalol 200 mg every eight hours.  11.Insulin NovoLog mix 70/30 22 units twice daily.  12.Tylenol as needed.   DIET:  Diabetic diet.   SPECIAL INSTRUCTIONS:  The patient should follow with Dr. Oliver Barre  of  [facility] for ongoing monitoring of hypertension and diabetes  mellitus.  He should follow Dr. Sharene Skeans of neurology service, [phone].  Call for appointment as well as  Dr. Faith Rogue with appointment to be made at the outpatient  rehabilitation center.      Mariam Dollar, P.A.      Ranelle Oyster, M.D.  Electronically Signed    DA/MEDQ  D:  09/19/2007  T:  09/19/2007  Job:  [id number]   cc:   Corwin Levins, MD  Deanna Artis. Sharene Skeans, M.D.

## 2011-01-29 ENCOUNTER — Encounter: Payer: Self-pay | Admitting: Internal Medicine

## 2011-01-29 DIAGNOSIS — R609 Edema, unspecified: Secondary | ICD-10-CM

## 2011-01-29 DIAGNOSIS — Z0000 Encounter for general adult medical examination without abnormal findings: Secondary | ICD-10-CM | POA: Insufficient documentation

## 2011-01-30 ENCOUNTER — Other Ambulatory Visit (INDEPENDENT_AMBULATORY_CARE_PROVIDER_SITE_OTHER): Payer: Medicare Other

## 2011-01-30 ENCOUNTER — Encounter: Payer: Self-pay | Admitting: Internal Medicine

## 2011-01-30 ENCOUNTER — Ambulatory Visit (INDEPENDENT_AMBULATORY_CARE_PROVIDER_SITE_OTHER): Payer: Medicare Other | Admitting: Internal Medicine

## 2011-01-30 ENCOUNTER — Other Ambulatory Visit: Payer: Self-pay | Admitting: Internal Medicine

## 2011-01-30 VITALS — BP 138/90 | HR 76 | Temp 98.1°F | Ht 70.0 in | Wt 273.1 lb

## 2011-01-30 DIAGNOSIS — R5383 Other fatigue: Secondary | ICD-10-CM

## 2011-01-30 DIAGNOSIS — E785 Hyperlipidemia, unspecified: Secondary | ICD-10-CM

## 2011-01-30 DIAGNOSIS — Z0000 Encounter for general adult medical examination without abnormal findings: Secondary | ICD-10-CM

## 2011-01-30 DIAGNOSIS — R5381 Other malaise: Secondary | ICD-10-CM

## 2011-01-30 DIAGNOSIS — E119 Type 2 diabetes mellitus without complications: Secondary | ICD-10-CM

## 2011-01-30 DIAGNOSIS — I10 Essential (primary) hypertension: Secondary | ICD-10-CM

## 2011-01-30 LAB — BASIC METABOLIC PANEL
CO2: 23 mEq/L (ref 19–32)
Calcium: 8.6 mg/dL (ref 8.4–10.5)
Chloride: 111 mEq/L (ref 96–112)
Creatinine, Ser: 3.7 mg/dL — ABNORMAL HIGH (ref 0.4–1.5)
Sodium: 140 mEq/L (ref 135–145)

## 2011-01-30 LAB — CBC WITH DIFFERENTIAL/PLATELET
Basophils Relative: 1.9 % (ref 0.0–3.0)
Eosinophils Relative: 4 % (ref 0.0–5.0)
Hemoglobin: 12.4 g/dL — ABNORMAL LOW (ref 13.0–17.0)
Lymphocytes Relative: 31.7 % (ref 12.0–46.0)
MCHC: 33.6 g/dL (ref 30.0–36.0)
Monocytes Relative: 10.4 % (ref 3.0–12.0)
Neutro Abs: 2.9 10*3/uL (ref 1.4–7.7)
Neutrophils Relative %: 52 % (ref 43.0–77.0)
RBC: 4.31 Mil/uL (ref 4.22–5.81)
WBC: 5.6 10*3/uL (ref 4.5–10.5)

## 2011-01-30 LAB — HEPATIC FUNCTION PANEL
AST: 32 U/L (ref 0–37)
Albumin: 2.5 g/dL — ABNORMAL LOW (ref 3.5–5.2)
Alkaline Phosphatase: 79 U/L (ref 39–117)
Total Protein: 6.1 g/dL (ref 6.0–8.3)

## 2011-01-30 LAB — HEMOGLOBIN A1C: Hgb A1c MFr Bld: 9.6 % — ABNORMAL HIGH (ref 4.6–6.5)

## 2011-01-30 MED ORDER — AMLODIPINE BESYLATE 10 MG PO TABS: 10.0000 mg | ORAL_TABLET | Freq: Every day | ORAL | Status: DC

## 2011-01-30 MED ORDER — GLIPIZIDE 10 MG PO TABS: 10.0000 mg | ORAL_TABLET | Freq: Two times a day (BID) | ORAL | Status: DC

## 2011-01-30 MED ORDER — FUROSEMIDE 40 MG PO TABS: 40.0000 mg | ORAL_TABLET | Freq: Two times a day (BID) | ORAL | Status: DC

## 2011-01-30 MED ORDER — LISINOPRIL 20 MG PO TABS: 20.0000 mg | ORAL_TABLET | Freq: Every day | ORAL | Status: DC

## 2011-01-30 MED ORDER — VARDENAFIL HCL 20 MG PO TABS: 20.0000 mg | ORAL_TABLET | ORAL | Status: AC | PRN

## 2011-01-30 MED ORDER — LABETALOL HCL 300 MG PO TABS: 300.0000 mg | ORAL_TABLET | Freq: Two times a day (BID) | ORAL | Status: DC

## 2011-01-30 MED ORDER — TRAMADOL HCL 50 MG PO TABS: 50.0000 mg | ORAL_TABLET | Freq: Four times a day (QID) | ORAL | Status: DC | PRN

## 2011-01-30 MED ORDER — CLONIDINE HCL 0.1 MG PO TABS: 0.1000 mg | ORAL_TABLET | Freq: Two times a day (BID) | ORAL | Status: DC

## 2011-01-30 NOTE — Assessment & Plan Note (Signed)
stable overall by hx and exam, most recent data reviewed with pt, and pt to continue medical treatment as before  Lab Results  Component Value Date   LDLCALC 106* 08/01/2010

## 2011-01-30 NOTE — Assessment & Plan Note (Signed)
stable overall by hx and exam, most recent data reviewed with pt, and pt to continue medical treatment as before  Lab Results  Component Value Date   HGBA1C 8.5* 08/01/2010

## 2011-01-30 NOTE — Assessment & Plan Note (Signed)
stable overall by hx and exam, most recent data reviewed with pt, and pt to continue medical treatment as before  BP Readings from Last 3 Encounters:  01/30/11 138/90  08/01/10 122/80  04/24/10 140/92

## 2011-01-30 NOTE — Progress Notes (Signed)
Subjective:    Patient ID: Oscar Castillo, male    DOB: [birth year], 60 y.o.   MRN: [id number]  HPI Here to f/u; overall doing ok,  Pt denies chest pain, increased sob or doe, wheezing, orthopnea, PND, increased LE swelling, palpitations, dizziness or syncope.  Pt denies new neurological symptoms such as new headache, or facial or extremity weakness or numbness   Pt denies polydipsia, polyuria, or low sugar symptoms such as weakness or confusion improved with po intake.  Pt states overall good compliance with meds, trying to follow lower cholesterol, diabetic diet, wt overall stable but little exercise however.  Has seen renal twice since jan 2012 with increased lasix to 40 bid, and urology with addition of levitra prn. Does have some fatigue - thinks it might be hydralazine.  Does have sense of ongoing fatigue, but denies signficant hypersomnolence.  Past Medical History  Diagnosis Date  . Abdominal pain, generalized 05/05/2007  . ABDOMINAL TENDERNESS 11/06/2009  . ABDOMINAL ULTRASOUND, ABNORMAL 04/23/2010  . ALLERGIC RHINITIS 05/05/2007  . BACK PAIN, LUMBAR, CHRONIC 08/06/2009  . BENIGN PROSTATIC HYPERTROPHY 08/01/2010  . CEREBROVASCULAR ACCIDENT, HX OF 08/06/2009  . CHOLELITHIASIS 08/01/2010  . CHRONIC KIDNEY DISEASE UNSPECIFIED 11/06/2009  . CONGESTIVE HEART FAILURE 03/18/2009  . DEPRESSION 03/18/2009  . DIABETES MELLITUS, TYPE II 03/25/2007  . ERECTILE DYSFUNCTION 03/25/2007  . FLANK PAIN, RIGHT 11/06/2009  . GERD 03/25/2007  . HEPATITIS C, HX OF 03/25/2007  . HYPERTENSION 03/25/2007  . Morbid obesity 03/25/2007  . NEPHROLITHIASIS, HX OF 03/18/2009  . RENAL INSUFFICIENCY 08/06/2009  . SHOULDER PAIN, RIGHT 09/18/2009  . THUMB PAIN, RIGHT 04/23/2010  . WEAKNESS 04/23/2010   Past Surgical History  Procedure Date  . Nephrectomy     partial RR    reports that he has never smoked. He does not have any smokeless tobacco history on file. He reports that he does not drink alcohol or use illicit drugs. family  history includes Coronary artery disease in his other and Diabetes in his other. No Known Allergies Current Outpatient Prescriptions on File Prior to Visit  Medication Sig Dispense Refill  . amLODipine (NORVASC) 10 MG tablet Take 10 mg by mouth daily.        [name] aspirin 81 MG EC tablet Take 81 mg by mouth daily.        . cloNIDine (CATAPRES) 0.1 MG tablet Take 0.1 mg by mouth 2 (two) times daily.        [name] glipiZIDE (GLUCOTROL) 10 MG tablet Take 10 mg by mouth 2 (two) times daily.        [name] glucose blood (FREESTYLE LITE) test strip Use as directed  once daily       . hydrALAZINE (APRESOLINE) 100 MG tablet Take 100 mg by mouth 3 (three) times daily.        [name] labetalol (NORMODYNE) 300 MG tablet Take 300 mg by mouth 2 (two) times daily.        . Lancets MISC Use as directed once daily       . lisinopril (PRINIVIL,ZESTRIL) 20 MG tablet Take 20 mg by mouth daily.        . pioglitazone (ACTOS) 45 MG tablet Take 45 mg by mouth daily.        . polyethylene glycol powder (MIRALAX) powder Take 17 g by mouth daily.        . pravastatin (PRAVACHOL) 20 MG tablet Take one (1) tablet(s) once daily  90 tablet  3  . traMADol (ULTRAM)  50 MG tablet Take 50 mg by mouth 4 (four) times daily as needed.        [name] DISCONTD: furosemide (LASIX) 40 MG tablet Take 40 mg by mouth daily.         Review of Systems Review of Systems  Constitutional: Negative for diaphoresis and unexpected weight change.  HENT: Negative for drooling and tinnitus.   Eyes: Negative for photophobia and visual disturbance.  Respiratory: Negative for choking and stridor.   Gastrointestinal: Negative for vomiting and blood in stool.  Genitourinary: Negative for hematuria and decreased urine volume.       Objective:   Physical Exam BP 138/90  Pulse 76  Temp(Src) 98.1 F (36.7 C) (Oral)  Ht 5\' 10"  (1.778 m)  Wt 273 lb 2 oz (123.889 kg)  BMI 39.19 kg/m2  SpO2 97% Physical Exam  VS noted Constitutional: Pt appears well-developed and  well-nourished.  HENT: Head: Normocephalic.  Right Ear: External ear normal.  Left Ear: External ear normal.  Eyes: Conjunctivae and EOM are normal. Pupils are equal, round, and reactive to light.  Neck: Normal range of motion. Neck supple.  Cardiovascular: Normal rate and regular rhythm.   Pulmonary/Chest: Effort normal and breath sounds normal.  Abd:  Soft, NT, non-distended, + BS Neurological: Pt is alert. No cranial nerve deficit.  Skin: Skin is warm. No erythema. trace LLE edema to mid calf, no ulcers Psychiatric: Pt behavior is normal. Thought content normal.         Assessment & Plan:

## 2011-01-30 NOTE — Assessment & Plan Note (Signed)
Etiology unclear, Exam otherwise benign, to check labs as documented, follow with expectant management  

## 2011-01-30 NOTE — Patient Instructions (Signed)
Continue all other medications as before Please go to LAB in the Basement for the blood and/or urine tests to be done today Please call the phone number [phone] (the PhoneTree System) for results of testing in 2-3 days;  When calling, simply dial the number, and when prompted enter the MRN number above (the Medical Record Number) and the # key, then the message should start. Please return in 6 months, or sooner if needed 

## 2011-02-05 ENCOUNTER — Ambulatory Visit: Payer: Medicare Other | Admitting: Internal Medicine

## 2011-02-06 ENCOUNTER — Ambulatory Visit: Payer: Medicare Other | Admitting: Internal Medicine

## 2011-02-10 ENCOUNTER — Other Ambulatory Visit: Payer: Self-pay

## 2011-02-10 ENCOUNTER — Encounter: Payer: Self-pay | Admitting: Internal Medicine

## 2011-02-10 ENCOUNTER — Ambulatory Visit (INDEPENDENT_AMBULATORY_CARE_PROVIDER_SITE_OTHER): Payer: Medicare Other | Admitting: Internal Medicine

## 2011-02-10 VITALS — BP 124/84 | HR 82 | Temp 98.1°F | Ht 70.0 in | Wt 268.0 lb

## 2011-02-10 DIAGNOSIS — E785 Hyperlipidemia, unspecified: Secondary | ICD-10-CM

## 2011-02-10 DIAGNOSIS — E119 Type 2 diabetes mellitus without complications: Secondary | ICD-10-CM

## 2011-02-10 DIAGNOSIS — I10 Essential (primary) hypertension: Secondary | ICD-10-CM

## 2011-02-10 MED ORDER — PEN NEEDLES 29G X 12MM MISC: Status: DC

## 2011-02-10 MED ORDER — GLIPIZIDE 10 MG PO TABS: 10.0000 mg | ORAL_TABLET | Freq: Every day | ORAL | Status: DC

## 2011-02-10 MED ORDER — INSULIN GLARGINE 100 UNIT/ML ~~LOC~~ SOLN: SUBCUTANEOUS | Status: DC

## 2011-02-10 NOTE — Patient Instructions (Addendum)
Please decrease the glucotrol to one pill in the AM with breakfast only Start the lantus at 10 units per day To start, please check your sugars three times per day;  If the average of your blood sugars after 3 days is still > 175, you can increase the lantus by 2 units.  This can be repeated every 3 days until the sugars eventually average < 175, or you have any low sugars, such as in the AM (after you have not been eating overnight). Please call if you have further questions Please return in 3 mo with Lab testing done 3-5 days before

## 2011-02-10 NOTE — Assessment & Plan Note (Signed)
stable overall by hx and exam, most recent data reviewed with pt, and pt to continue medical treatment as before  Lab Results  Component Value Date   LDLCALC 106* 08/01/2010

## 2011-02-10 NOTE — Assessment & Plan Note (Signed)
Uncontrolled, most recent data reviewed with pt, and pt to continue medical treatment as before, except since am sugars are so good, he will need to stop the PM glucotrol, and add lantus 10 units (to start) with careful titration scheme given the severe renal insufficiency  Lab Results  Component Value Date   HGBA1C 9.6* 01/30/2011

## 2011-02-10 NOTE — Assessment & Plan Note (Signed)
stable overall by hx and exam, most recent data reviewed with pt, and pt to continue medical treatment as before  BP Readings from Last 3 Encounters:  02/10/11 124/84  01/30/11 138/90  08/01/10 122/80

## 2011-02-10 NOTE — Progress Notes (Signed)
Subjective:    Patient ID: Oscar Castillo, male    DOB: 07/21/1951, 60 y.o.   MRN: [id number]  HPI Here to f/u; overall doing ok,  Pt denies chest pain, increased sob or doe, wheezing, orthopnea, PND, increased LE swelling, palpitations, dizziness or syncope.  Pt denies new neurological symptoms such as new headache, or facial or extremity weakness or numbness   Pt denies polydipsia, polyuria, or low sugar symptoms such as weakness or confusion improved with po intake.  Pt states overall good compliance with meds, trying to follow lower cholesterol, diabetic diet, wt overall stable but little exercise however.  Last a1c was elev at 9.6 despite good med compliance.  AM sugars have been very good in the 105 range, but does not check later in the day. Past Medical History  Diagnosis Date  . Abdominal pain, generalized 05/05/2007  . ABDOMINAL TENDERNESS 11/06/2009  . ABDOMINAL ULTRASOUND, ABNORMAL 04/23/2010  . ALLERGIC RHINITIS 05/05/2007  . BACK PAIN, LUMBAR, CHRONIC 08/06/2009  . BENIGN PROSTATIC HYPERTROPHY 08/01/2010  . CEREBROVASCULAR ACCIDENT, HX OF 08/06/2009  . CHOLELITHIASIS 08/01/2010  . CHRONIC KIDNEY DISEASE UNSPECIFIED 11/06/2009  . CONGESTIVE HEART FAILURE 03/18/2009  . DEPRESSION 03/18/2009  . DIABETES MELLITUS, TYPE II 03/25/2007  . ERECTILE DYSFUNCTION 03/25/2007  . FLANK PAIN, RIGHT 11/06/2009  . GERD 03/25/2007  . HEPATITIS C, HX OF 03/25/2007  . HYPERTENSION 03/25/2007  . Morbid obesity 03/25/2007  . NEPHROLITHIASIS, HX OF 03/18/2009  . RENAL INSUFFICIENCY 08/06/2009  . SHOULDER PAIN, RIGHT 09/18/2009  . THUMB PAIN, RIGHT 04/23/2010  . WEAKNESS 04/23/2010   Past Surgical History  Procedure Date  . Nephrectomy     partial RR    reports that he has never smoked. He does not have any smokeless tobacco history on file. He reports that he does not drink alcohol or use illicit drugs. family history includes Coronary artery disease in his other and Diabetes in his other. No Known  Allergies Current Outpatient Prescriptions on File Prior to Visit  Medication Sig Dispense Refill  . amLODipine (NORVASC) 10 MG tablet Take 1 tablet (10 mg total) by mouth daily.  90 tablet  3  . aspirin 81 MG EC tablet Take 81 mg by mouth daily.        . cloNIDine (CATAPRES) 0.1 MG tablet Take 1 tablet (0.1 mg total) by mouth 2 (two) times daily.  180 tablet  3  . furosemide (LASIX) 40 MG tablet Take 1 tablet (40 mg total) by mouth 2 (two) times daily.  60 tablet  11  . glucose blood (FREESTYLE LITE) test strip Use as directed  once daily       . hydrALAZINE (APRESOLINE) 100 MG tablet Take 100 mg by mouth 3 (three) times daily.        [name] labetalol (NORMODYNE) 300 MG tablet Take 1 tablet (300 mg total) by mouth 2 (two) times daily.  180 tablet  3  . Lancets MISC Use as directed once daily       . lisinopril (PRINIVIL,ZESTRIL) 20 MG tablet Take 1 tablet (20 mg total) by mouth daily.  180 tablet  3  . pioglitazone (ACTOS) 45 MG tablet Take 45 mg by mouth daily.        . polyethylene glycol powder (MIRALAX) powder Take 17 g by mouth daily.        . pravastatin (PRAVACHOL) 20 MG tablet Take one (1) tablet(s) once daily  90 tablet  3  . traMADol (ULTRAM) 50 MG tablet  Take 1 tablet (50 mg total) by mouth 4 (four) times daily as needed.  120 tablet  2  . vardenafil (LEVITRA) 20 MG tablet Take 1 tablet (20 mg total) by mouth as needed for erectile dysfunction.  10 tablet  5   Review of Systems Review of Systems  Constitutional: Negative for diaphoresis and unexpected weight change.  HENT: Negative for drooling and tinnitus.   Eyes: Negative for photophobia and visual disturbance.  Respiratory: Negative for choking and stridor.   Gastrointestinal: Negative for vomiting and blood in stool. .       Objective:   Physical Exam BP 124/84  Pulse 82  Temp(Src) 98.1 F (36.7 C) (Oral)  Ht 5\' 10"  (1.778 m)  Wt 268 lb (121.564 kg)  BMI 38.45 kg/m2  SpO2 98% Physical Exam  VS noted Constitutional:  Pt appears well-developed and well-nourished.  HENT: Head: Normocephalic.  Right Ear: External ear normal.  Left Ear: External ear normal.  Eyes: Conjunctivae and EOM are normal. Pupils are equal, round, and reactive to light.  Neck: Normal range of motion. Neck supple.  Cardiovascular: Normal rate and regular rhythm.   Pulmonary/Chest: Effort normal and breath sounds normal.  Neurological: Pt is alert. No cranial nerve deficit.  Skin: Skin is warm. No erythema.  Psychiatric: Pt behavior is normal. Thought content normal.         Assessment & Plan:

## 2011-02-19 ENCOUNTER — Telehealth: Payer: Self-pay

## 2011-02-19 MED ORDER — INSULIN GLARGINE 100 UNIT/ML ~~LOC~~ SOLN: 10.0000 [IU] | Freq: Every day | SUBCUTANEOUS | Status: DC

## 2011-02-19 NOTE — Telephone Encounter (Signed)
Ok to change to what pt prefers - robin to handle

## 2011-02-19 NOTE — Telephone Encounter (Signed)
Patient called to inform insulin and needles sent to pharmacy were incorrect. He was suppose to get the pen with the needles, they filled the bottle to draw up. Please advise.

## 2011-04-16 LAB — COMPREHENSIVE METABOLIC PANEL
ALT: 33
AST: 44 — ABNORMAL HIGH
CO2: 20
Calcium: 9
GFR calc Af Amer: 46 — ABNORMAL LOW
Potassium: 3.8
Sodium: 136
Total Protein: 7.9

## 2011-04-16 LAB — LIPID PANEL
Cholesterol: 143
HDL: 32 — ABNORMAL LOW
LDL Cholesterol: 98
Triglycerides: 65

## 2011-04-16 LAB — CBC
HCT: 44.3
HCT: 51
Hemoglobin: 14.4
Hemoglobin: 15
MCHC: 32.2
MCV: 87.3
MCV: 87.8
Platelets: 211
Platelets: 253
RBC: 5.04
RBC: 5.26
RBC: 5.93 — ABNORMAL HIGH
WBC: 5.6
WBC: 7.6
WBC: 8.5
WBC: 9.6

## 2011-04-16 LAB — BASIC METABOLIC PANEL
BUN: 22
Calcium: 8.5
Calcium: 8.8
Creatinine, Ser: 1.59 — ABNORMAL HIGH
Creatinine, Ser: 1.8 — ABNORMAL HIGH
GFR calc non Af Amer: 39 — ABNORMAL LOW
GFR calc non Af Amer: 45 — ABNORMAL LOW
Potassium: 3.8
Sodium: 136

## 2011-04-16 LAB — CK TOTAL AND CKMB (NOT AT ARMC)
CK, MB: 5.2 — ABNORMAL HIGH
Relative Index: INVALID
Relative Index: INVALID
Total CK: 50
Total CK: 62

## 2011-04-16 LAB — DIFFERENTIAL
Eosinophils Absolute: 0
Eosinophils Relative: 0
Lymphs Abs: 0.7
Monocytes Relative: 8

## 2011-04-16 LAB — URINALYSIS, ROUTINE W REFLEX MICROSCOPIC
Glucose, UA: >1000 — AB
Ketones, ur: 15 — AB
Leukocytes, UA: NEGATIVE
pH: 5.5

## 2011-04-16 LAB — RAPID URINE DRUG SCREEN, HOSP PERFORMED
Opiates: NOT DETECTED
Tetrahydrocannabinol: NOT DETECTED

## 2011-04-16 LAB — CARDIAC PANEL(CRET KIN+CKTOT+MB+TROPI)
CK, MB: 4.2 — ABNORMAL HIGH
Total CK: 56

## 2011-04-16 LAB — HEMOGLOBIN A1C
Hgb A1c MFr Bld: 9 — ABNORMAL HIGH
Mean Plasma Glucose: 243

## 2011-04-16 LAB — URINE MICROSCOPIC-ADD ON

## 2011-04-16 LAB — AMYLASE: Amylase: 126

## 2011-04-17 LAB — CATECHOLAMINES, FRACTIONATED, URINE, 24 HOUR
Epinephrine 24 Hr Urine: 2 ug/24hr (ref <20)
Time-CATEUR: 24
Total urine volume: 1625 mL

## 2011-04-17 LAB — URINE CULTURE
Colony Count: >=100000
Culture: NO GROWTH
Special Requests: POSITIVE

## 2011-04-17 LAB — CBC
HCT: 42.4
HCT: 44.4
Hemoglobin: 13.8
MCHC: 33.4
MCV: 86.9
Platelets: 273
Platelets: 384
RDW: 13.7
WBC: 6.9

## 2011-04-17 LAB — BASIC METABOLIC PANEL
BUN: 29 — ABNORMAL HIGH
CO2: 26
Calcium: 9.1
Calcium: 9.4
Chloride: 101
Chloride: 101
Chloride: 102
Creatinine, Ser: 1.09
Creatinine, Ser: 1.29
Creatinine, Ser: 1.39
GFR calc Af Amer: >60
GFR calc Af Amer: >60
GFR calc Af Amer: >60
GFR calc non Af Amer: 58 — ABNORMAL LOW
GFR calc non Af Amer: 53 — ABNORMAL LOW
GFR calc non Af Amer: 58 — ABNORMAL LOW
Glucose, Bld: 281 — ABNORMAL HIGH
Potassium: 4.1
Potassium: 4
Sodium: 130 — ABNORMAL LOW
Sodium: 135

## 2011-04-17 LAB — METANEPHRINES, URINE, 24 HOUR
Metaneph Total, Ur: 325 mcg/24 h (ref 224–832)
Normetanephrine, 24H Ur: 175 mcg/24 h (ref 122–676)

## 2011-04-17 LAB — VMA AND HVA, 24 HR UR
Homovanillic Acid, 24H Ur: 1.5 mg/d (ref 0.0–15.0)
Time-VMAHVA: 24 hr
VMA, Urine: 1 mg/g (ref 0–6)

## 2011-04-17 LAB — URINALYSIS, ROUTINE W REFLEX MICROSCOPIC
Bilirubin Urine: NEGATIVE
Ketones, ur: NEGATIVE
Nitrite: NEGATIVE
Protein, ur: 100 — AB
Specific Gravity, Urine: 1.016
Urobilinogen, UA: 1

## 2011-04-17 LAB — COMPREHENSIVE METABOLIC PANEL
AST: 29
Albumin: 2.2 — ABNORMAL LOW
BUN: 14
Calcium: 8.7
Creatinine, Ser: 1.14
GFR calc Af Amer: >60

## 2011-04-17 LAB — DIFFERENTIAL
Basophils Absolute: 0
Eosinophils Relative: 3
Lymphocytes Relative: 27
Lymphs Abs: 1.4
Monocytes Absolute: 0.7
Neutro Abs: 3.1

## 2011-04-20 LAB — BASIC METABOLIC PANEL
Calcium: 9.3
GFR calc Af Amer: >60
GFR calc non Af Amer: 59 — ABNORMAL LOW
Glucose, Bld: 85
Potassium: 4.2
Sodium: 137

## 2011-04-30 ENCOUNTER — Telehealth: Payer: Self-pay

## 2011-04-30 MED ORDER — INSULIN GLARGINE 100 UNIT/ML ~~LOC~~ SOLN: 20.0000 [IU] | Freq: Every day | SUBCUTANEOUS | Status: DC

## 2011-04-30 NOTE — Telephone Encounter (Signed)
Informed patient of MD's instructions. He was concerned to increase insulin 20 units qhs BS average in the mornings has been between 80-90, please advise

## 2011-04-30 NOTE — Telephone Encounter (Signed)
Patient needs samples or alternative to actos pharmacy informed he is in donut hole.

## 2011-04-30 NOTE — Telephone Encounter (Signed)
Ok to stop the actos  Increase the lantus to 20 units qhs  OK to increase the lantus by 3 units every 3 days if the average blood sugars (checked at least twice per day) is more than 175, but do not increase more if the AM sugar get < 120 on a regular basis

## 2011-04-30 NOTE — Telephone Encounter (Signed)
Same advice;  Stopping the actos will likely result in 75-100 pt overall increased in his BS, and he will need more lantus to compensate, and 20 units may not be enough

## 2011-05-01 NOTE — Telephone Encounter (Signed)
Called patient informed.

## 2011-05-01 NOTE — Telephone Encounter (Signed)
Called patient; left message to call back.

## 2011-05-06 ENCOUNTER — Ambulatory Visit (INDEPENDENT_AMBULATORY_CARE_PROVIDER_SITE_OTHER): Payer: Medicare Other | Admitting: Internal Medicine

## 2011-05-06 ENCOUNTER — Encounter: Payer: Self-pay | Admitting: Internal Medicine

## 2011-05-06 ENCOUNTER — Other Ambulatory Visit (INDEPENDENT_AMBULATORY_CARE_PROVIDER_SITE_OTHER): Payer: Medicare Other

## 2011-05-06 VITALS — BP 132/80 | HR 82 | Temp 98.4°F | Ht 70.0 in | Wt 265.0 lb

## 2011-05-06 DIAGNOSIS — N320 Bladder-neck obstruction: Secondary | ICD-10-CM

## 2011-05-06 DIAGNOSIS — R5383 Other fatigue: Secondary | ICD-10-CM

## 2011-05-06 DIAGNOSIS — E119 Type 2 diabetes mellitus without complications: Secondary | ICD-10-CM

## 2011-05-06 DIAGNOSIS — R5381 Other malaise: Secondary | ICD-10-CM

## 2011-05-06 DIAGNOSIS — E785 Hyperlipidemia, unspecified: Secondary | ICD-10-CM

## 2011-05-06 DIAGNOSIS — I10 Essential (primary) hypertension: Secondary | ICD-10-CM

## 2011-05-06 LAB — HEPATIC FUNCTION PANEL
ALT: 29 U/L (ref 0–53)
Alkaline Phosphatase: 79 U/L (ref 39–117)
Bilirubin, Direct: 0.1 mg/dL (ref 0.0–0.3)
Total Bilirubin: 0.2 mg/dL — ABNORMAL LOW (ref 0.3–1.2)

## 2011-05-06 LAB — CBC WITH DIFFERENTIAL/PLATELET
Basophils Absolute: 0.1 10*3/uL (ref 0.0–0.1)
Basophils Relative: 1.1 % (ref 0.0–3.0)
Eosinophils Absolute: 0.2 10*3/uL (ref 0.0–0.7)
MCHC: 32.9 g/dL (ref 30.0–36.0)
MCV: 84.5 fl (ref 78.0–100.0)
Monocytes Absolute: 0.8 10*3/uL (ref 0.1–1.0)
Neutro Abs: 4.8 10*3/uL (ref 1.4–7.7)
Neutrophils Relative %: 71.6 % (ref 43.0–77.0)
RBC: 4.48 Mil/uL (ref 4.22–5.81)
RDW: 13.9 % (ref 11.5–14.6)

## 2011-05-06 LAB — BASIC METABOLIC PANEL
CO2: 25 mEq/L (ref 19–32)
Chloride: 107 mEq/L (ref 96–112)
Creatinine, Ser: 3.9 mg/dL — ABNORMAL HIGH (ref 0.4–1.5)
Potassium: 4.5 mEq/L (ref 3.5–5.1)
Sodium: 140 mEq/L (ref 135–145)

## 2011-05-06 MED ORDER — INSULIN GLARGINE 100 UNIT/ML ~~LOC~~ SOLN: 18.0000 [IU] | Freq: Every day | SUBCUTANEOUS | Status: DC

## 2011-05-06 NOTE — Assessment & Plan Note (Signed)
Etiology unclear, Exam otherwise benign, to check labs as documented, follow with expectant management  

## 2011-05-06 NOTE — Assessment & Plan Note (Addendum)
stable overall by hx and exam, most recent data reviewed with pt, and pt to continue medical treatment as before  BP Readings from Last 3 Encounters:  05/06/11 132/80  02/10/11 124/84  01/30/11 138/90

## 2011-05-06 NOTE — Progress Notes (Signed)
Subjective:    Patient ID: Oscar Castillo, male    DOB: Aug 22, 1950, 60 y.o.   MRN: [id number]  HPI Here for f/u;  Overall doing ok;  Pt denies CP, worsening SOB, DOE, wheezing, orthopnea, PND, worsening LE edema, palpitations, dizziness or syncope.  Pt denies neurological change such as new Headache, facial or extremity weakness.  Pt denies polydipsia, polyuria, or low sugar symptoms. Pt states overall good compliance with treatment and medications, good tolerability, and trying to follow lower cholesterol diet, but has had mult low sugars in the AM recently on lantus, often 80-90.  Pt denies worsening depressive symptoms, suicidal ideation or panic. No fever, wt loss, night sweats, loss of appetite, or other constitutional symptoms.  Pt states good ability with ADL's, low fall risk, home safety reviewed and adequate, no significant changes in hearing or vision, and occasionally active with exercise.  See urology  Who just retired , but informed him he has renal stone, and Gallstone. Also sees Dr Ervin Knack, who brought up the idea to the pt recently of dialysis.  Off the ACE now, and taking lasix bid, did not mention any fistula or specifics so far per pt.  Now on zemplar as well.  Also mentions has right flank pain,mild, intermittent, x 1-2 days, no radiation, may have been better with laxative/stool softner and BM today, not worse to stadning up or twisting .  No LE pain or nubmness.   Denies urinary symptoms such as dysuria, frequency, urgency,or hematuria.  No other GI complaints except "gas" , no blood. Does have sense of ongoing fatigue, but denies signficant hypersomnolence.  Declnies flu shot, also due for colonscopy but not sure if he wants it. Needs handicap sticker due to chronic left leg pain Past Medical History  Diagnosis Date  . Abdominal pain, generalized 05/05/2007  . ABDOMINAL TENDERNESS 11/06/2009  . ABDOMINAL ULTRASOUND, ABNORMAL 04/23/2010  . ALLERGIC RHINITIS 05/05/2007  . BACK PAIN,  LUMBAR, CHRONIC 08/06/2009  . BENIGN PROSTATIC HYPERTROPHY 08/01/2010  . CEREBROVASCULAR ACCIDENT, HX OF 08/06/2009  . CHOLELITHIASIS 08/01/2010  . CHRONIC KIDNEY DISEASE UNSPECIFIED 11/06/2009  . CONGESTIVE HEART FAILURE 03/18/2009  . DEPRESSION 03/18/2009  . DIABETES MELLITUS, TYPE II 03/25/2007  . ERECTILE DYSFUNCTION 03/25/2007  . FLANK PAIN, RIGHT 11/06/2009  . GERD 03/25/2007  . HEPATITIS C, HX OF 03/25/2007  . HYPERTENSION 03/25/2007  . Morbid obesity 03/25/2007  . NEPHROLITHIASIS, HX OF 03/18/2009  . RENAL INSUFFICIENCY 08/06/2009  . SHOULDER PAIN, RIGHT 09/18/2009  . THUMB PAIN, RIGHT 04/23/2010  . WEAKNESS 04/23/2010   Past Surgical History  Procedure Date  . Nephrectomy     partial RR    reports that he has never smoked. He does not have any smokeless tobacco history on file. He reports that he does not drink alcohol or use illicit drugs. family history includes Coronary artery disease in his other and Diabetes in his other. No Known Allergies Current Outpatient Prescriptions on File Prior to Visit  Medication Sig Dispense Refill  . amLODipine (NORVASC) 10 MG tablet Take 1 tablet (10 mg total) by mouth daily.  90 tablet  3  . aspirin 81 MG EC tablet Take 81 mg by mouth daily.        . cloNIDine (CATAPRES) 0.1 MG tablet Take 1 tablet (0.1 mg total) by mouth 2 (two) times daily.  180 tablet  3  . furosemide (LASIX) 40 MG tablet Take 1 tablet (40 mg total) by mouth 2 (two) times daily.  60 tablet  11  . glipiZIDE (GLUCOTROL) 10 MG tablet Take 1 tablet (10 mg total) by mouth daily with breakfast.  90 tablet  3  . glucose blood (FREESTYLE LITE) test strip Use as directed  once daily       . hydrALAZINE (APRESOLINE) 100 MG tablet Take 100 mg by mouth 3 (three) times daily.        . insulin glargine (LANTUS SOLOSTAR) 100 UNIT/ML injection Inject 20 Units into the skin at bedtime.  3 mL  12  . Insulin Pen Needle (PEN NEEDLES) 29G X MISC Use as directed  100 each  11  . labetalol (NORMODYNE)  300 MG tablet Take 1 tablet (300 mg total) by mouth 2 (two) times daily.  180 tablet  3  . Lancets MISC Use as directed once daily       . polyethylene glycol powder (MIRALAX) powder Take 17 g by mouth daily.        . pravastatin (PRAVACHOL) 20 MG tablet Take one (1) tablet(s) once daily  90 tablet  3  . traMADol (ULTRAM) 50 MG tablet Take 1 tablet (50 mg total) by mouth 4 (four) times daily as needed.  120 tablet  2   Review of Systems Review of Systems  Constitutional: Negative for diaphoresis and unexpected weight change.  HENT: Negative for drooling and tinnitus.   Eyes: Negative for photophobia and visual disturbance.  Respiratory: Negative for choking and stridor.   Gastrointestinal: Negative for vomiting and blood in stool.  Genitourinary: Negative for hematuria and decreased urine volume.  Musculoskeletal: Negative for gait problem.  Skin: Negative for color change and wound.  Neurological: Negative for tremors and numbness.  Psychiatric/Behavioral: Negative for decreased concentration. The patient is not hyperactive.       Objective:   Physical Exam BP 132/80  Pulse 82  Temp(Src) 98.4 F (36.9 C) (Oral)  Ht 5\' 10"  (1.778 m)  Wt 265 lb (120.203 kg)  BMI 38.02 kg/m2  SpO2 97% Physical Exam  VS noted Constitutional: Pt appears well-developed and well-nourished.  HENT: Head: Normocephalic.  Right Ear: External ear normal.  Left Ear: External ear normal.  Eyes: Conjunctivae and EOM are normal. Pupils are equal, round, and reactive to light.  Neck: Normal range of motion. Neck supple.  Cardiovascular: Normal rate and regular rhythm.   Pulmonary/Chest: Effort normal and breath sounds normal.  Abd:  Soft, NT, non-distended, + BS Neurological: Pt is alert. No cranial nerve deficit.  Skin: Skin is warm. No erythema.  Psychiatric: Pt behavior is normal. Thought content normal.     Assessment & Plan:

## 2011-05-06 NOTE — Assessment & Plan Note (Addendum)
overcontrolled by hx but with hx of worsening renal fxn, to decresae the lantus to 18 units, check a1c today, may need SSI in addition  Lab Results  Component Value Date   HGBA1C 9.6* 01/30/2011

## 2011-05-06 NOTE — Assessment & Plan Note (Signed)
Also for psa - will order

## 2011-05-06 NOTE — Assessment & Plan Note (Signed)
stable overall by hx and exam, most recent data reviewed with pt, and pt to continue medical treatment as before  Lab Results  Component Value Date   LDLCALC 106* 08/01/2010   Goal ldl < 70

## 2011-05-06 NOTE — Patient Instructions (Signed)
Ok to decrease the lantus to 18 units per day Continue all other medications as before Please go to LAB in the Basement for the blood and/or urine tests to be done today Please call the phone number [phone] (the [employer] System) for results of testing in 2-3 days;  When calling, simply dial the number, and when prompted enter the MRN number above (the Medical Record Number) and the # key, then the message should start. Please call if you change your mind about the flu shot, or the colonoscopy Please keep your appointments with your specialists as you have planned - Dr Orland Dec You are given the handicap form today

## 2011-05-13 ENCOUNTER — Ambulatory Visit: Payer: Medicare Other | Admitting: Internal Medicine

## 2011-05-27 ENCOUNTER — Other Ambulatory Visit: Payer: Self-pay

## 2011-05-27 ENCOUNTER — Other Ambulatory Visit: Payer: Self-pay | Admitting: Internal Medicine

## 2011-05-27 MED ORDER — AMLODIPINE BESYLATE 10 MG PO TABS: 10.0000 mg | ORAL_TABLET | Freq: Every day | ORAL | Status: DC

## 2011-06-01 ENCOUNTER — Other Ambulatory Visit: Payer: Self-pay

## 2011-06-01 MED ORDER — LABETALOL HCL 300 MG PO TABS: 300.0000 mg | ORAL_TABLET | Freq: Two times a day (BID) | ORAL | Status: DC

## 2011-07-06 ENCOUNTER — Other Ambulatory Visit (HOSPITAL_COMMUNITY): Payer: Self-pay | Admitting: Urology

## 2011-07-06 DIAGNOSIS — D4959 Neoplasm of unspecified behavior of other genitourinary organ: Secondary | ICD-10-CM

## 2011-07-13 ENCOUNTER — Ambulatory Visit (HOSPITAL_COMMUNITY)
Admission: RE | Admit: 2011-07-13 | Discharge: 2011-07-13 | Disposition: A | Discharge status: Home / Self Care | Payer: Medicare Other | Source: Ambulatory Visit | Attending: Urology | Admitting: Urology

## 2011-07-13 DIAGNOSIS — K8020 Calculus of gallbladder without cholecystitis without obstruction: Secondary | ICD-10-CM | POA: Insufficient documentation

## 2011-07-13 DIAGNOSIS — N289 Disorder of kidney and ureter, unspecified: Secondary | ICD-10-CM | POA: Insufficient documentation

## 2011-07-13 DIAGNOSIS — D4959 Neoplasm of unspecified behavior of other genitourinary organ: Secondary | ICD-10-CM

## 2011-07-13 DIAGNOSIS — E119 Type 2 diabetes mellitus without complications: Secondary | ICD-10-CM | POA: Insufficient documentation

## 2011-07-13 DIAGNOSIS — I10 Essential (primary) hypertension: Secondary | ICD-10-CM | POA: Insufficient documentation

## 2011-07-20 ENCOUNTER — Other Ambulatory Visit: Payer: Self-pay | Admitting: Internal Medicine

## 2011-08-07 ENCOUNTER — Ambulatory Visit (INDEPENDENT_AMBULATORY_CARE_PROVIDER_SITE_OTHER): Payer: Medicare Other | Admitting: Internal Medicine

## 2011-08-07 VITALS — BP 134/86 | HR 76 | Temp 98.6°F | Wt 266.0 lb

## 2011-08-07 DIAGNOSIS — L03039 Cellulitis of unspecified toe: Secondary | ICD-10-CM

## 2011-08-07 DIAGNOSIS — E119 Type 2 diabetes mellitus without complications: Secondary | ICD-10-CM | POA: Diagnosis not present

## 2011-08-07 DIAGNOSIS — L03032 Cellulitis of left toe: Secondary | ICD-10-CM | POA: Insufficient documentation

## 2011-08-07 DIAGNOSIS — L02619 Cutaneous abscess of unspecified foot: Secondary | ICD-10-CM | POA: Diagnosis not present

## 2011-08-07 DIAGNOSIS — I10 Essential (primary) hypertension: Secondary | ICD-10-CM

## 2011-08-07 MED ORDER — DOXYCYCLINE HYCLATE 100 MG PO TABS: 100.0000 mg | ORAL_TABLET | Freq: Two times a day (BID) | ORAL | Status: AC

## 2011-08-07 MED ORDER — HYDRALAZINE HCL 100 MG PO TABS: 100.0000 mg | ORAL_TABLET | Freq: Three times a day (TID) | ORAL | Status: DC

## 2011-08-07 NOTE — Patient Instructions (Addendum)
Take all new medications as prescribed Continue all other medications as before Please go to ER over the weekend if you have worsening pain, redness, swelling or drainage

## 2011-08-08 NOTE — Assessment & Plan Note (Signed)
stable overall by hx and exam, most recent data reviewed with pt, and pt to continue medical treatment as before  Lab Results  Component Value Date   HGBA1C 9.6* 01/30/2011

## 2011-08-08 NOTE — Assessment & Plan Note (Signed)
Mild to mod, for antibx course,  to f/u any worsening symptoms or concerns 

## 2011-08-08 NOTE — Progress Notes (Signed)
Subjective:    Patient ID: Oscar Castillo, male    DOB: [birth year], 61 y.o.   MRN: [id number]  HPI  Here with 3 wks onset left dorsal great toe with mild to mod pain, red, swelling after stumbling over a rug at home, then with a crack in the skin of the dorsal toe that just has not healed well yet.  No hx of PVC and sensation intact per pt.  Here to f/u; overall doing ok,  Pt denies chest pain, increased sob or doe, wheezing, orthopnea, PND, increased LE swelling, palpitations, dizziness or syncope.  Pt denies new neurological symptoms such as new headache, or facial or extremity weakness or numbness   Pt denies polydipsia, polyuria, or low sugar symptoms such as weakness or confusion improved with po intake.  Pt states overall good compliance with meds, trying to follow lower cholesterol, diabetic diet, wt overall stable but little exercise however.  Past Medical History  Diagnosis Date  . Abdominal pain, generalized 05/05/2007  . ABDOMINAL TENDERNESS 11/06/2009  . ABDOMINAL ULTRASOUND, ABNORMAL 04/23/2010  . ALLERGIC RHINITIS 05/05/2007  . BACK PAIN, LUMBAR, CHRONIC 08/06/2009  . BENIGN PROSTATIC HYPERTROPHY 08/01/2010  . CEREBROVASCULAR ACCIDENT, HX OF 08/06/2009  . CHOLELITHIASIS 08/01/2010  . CHRONIC KIDNEY DISEASE UNSPECIFIED 11/06/2009  . CONGESTIVE HEART FAILURE 03/18/2009  . DEPRESSION 03/18/2009  . DIABETES MELLITUS, TYPE II 03/25/2007  . ERECTILE DYSFUNCTION 03/25/2007  . FLANK PAIN, RIGHT 11/06/2009  . GERD 03/25/2007  . HEPATITIS C, HX OF 03/25/2007  . HYPERTENSION 03/25/2007  . Morbid obesity 03/25/2007  . NEPHROLITHIASIS, HX OF 03/18/2009  . RENAL INSUFFICIENCY 08/06/2009  . SHOULDER PAIN, RIGHT 09/18/2009  . THUMB PAIN, RIGHT 04/23/2010  . WEAKNESS 04/23/2010   Past Surgical History  Procedure Date  . Nephrectomy     partial RR    reports that he has never smoked. He does not have any smokeless tobacco history on file. He reports that he does not drink alcohol or use illicit drugs. family  history includes Coronary artery disease in his other and Diabetes in his other. No Known Allergies Current Outpatient Prescriptions on File Prior to Visit  Medication Sig Dispense Refill  . amLODipine (NORVASC) 10 MG tablet Take 1 tablet (10 mg total) by mouth daily.  90 tablet  3  . aspirin 81 MG EC tablet Take 81 mg by mouth daily.        . cloNIDine (CATAPRES) 0.1 MG tablet Take 1 tablet (0.1 mg total) by mouth 2 (two) times daily.  180 tablet  3  . furosemide (LASIX) 40 MG tablet Take 1 tablet (40 mg total) by mouth 2 (two) times daily.  60 tablet  11  . glipiZIDE (GLUCOTROL) 10 MG tablet TAKE ONE (1) TABLET(S) TWICE DAILY  180 tablet  1  . glucose blood (FREESTYLE LITE) test strip Use as directed  once daily       . insulin glargine (LANTUS SOLOSTAR) 100 UNIT/ML injection Inject 18 Units into the skin at bedtime.  3 mL  12  . Insulin Pen Needle (PEN NEEDLES) 29G X MISC Use as directed  100 each  11  . labetalol (NORMODYNE) 300 MG tablet Take 1 tablet (300 mg total) by mouth 2 (two) times daily.  180 tablet  3  . Lancets MISC Use as directed once daily       . polyethylene glycol powder (MIRALAX) powder Take 17 g by mouth daily.        . pravastatin (PRAVACHOL)  20 MG tablet Take one (1) tablet(s) once daily  90 tablet  3  . traMADol (ULTRAM) 50 MG tablet Take 1 tablet (50 mg total) by mouth 4 (four) times daily as needed.  120 tablet  2   Review of Systems Review of Systems  Constitutional: Negative for diaphoresis and unexpected weight change.  HENT: Negative for drooling and tinnitus.   Eyes: Negative for photophobia and visual disturbance.  Respiratory: Negative for choking and stridor.   Gastrointestinal: Negative for vomiting and blood in stool.  Genitourinary: Negative for hematuria and decreased urine volume.    Objective:   Physical Exam BP 134/86  Pulse 76  Temp(Src) 98.6 F (37 C) (Oral)  Wt 266 lb (120.657 kg)  SpO2 97% Physical Exam  VS noted, not ill  appaering Constitutional: Pt appears well-developed and well-nourished.  HENT: Head: Normocephalic.  Right Ear: External ear normal.  Left Ear: External ear normal.  Eyes: Conjunctivae and EOM are normal. Pupils are equal, round, and reactive to light.  Neck: Normal range of motion. Neck supple.  Cardiovascular: Normal rate and regular rhythm.   Pulmonary/Chest: Effort normal and breath sounds normal.  Neurological: Pt is alert. No cranial nerve deficit.  Skin: Skin is warm. No erythema. left great dorsal toe with 1 cm superficial skin opening but with 1-2+ red, tender, swelling, withut fluctuance, drainage or red streak Psychiatric: Pt behavior is normal. Thought content normal.     Assessment & Plan:

## 2011-08-08 NOTE — Assessment & Plan Note (Signed)
stable overall by hx and exam, most recent data reviewed with pt, and pt to continue medical treatment as before  BP Readings from Last 3 Encounters:  08/07/11 134/86  05/06/11 132/80  02/10/11 124/84

## 2011-08-24 ENCOUNTER — Other Ambulatory Visit: Payer: Self-pay

## 2011-08-24 MED ORDER — DOXYCYCLINE HYCLATE 100 MG PO TABS: 100.0000 mg | ORAL_TABLET | Freq: Two times a day (BID) | ORAL | Status: AC

## 2011-08-24 NOTE — Telephone Encounter (Signed)
Ok - doxy asd per [employer]

## 2011-08-24 NOTE — Telephone Encounter (Signed)
Patient informed. 

## 2011-08-24 NOTE — Telephone Encounter (Signed)
Patient requesting a refill on antibiotic for toe problem. The toe is better but not completely. Please advise, call back number for the patient is [phone]

## 2011-08-25 ENCOUNTER — Ambulatory Visit: Payer: Medicare Other | Admitting: Internal Medicine

## 2011-08-27 DIAGNOSIS — N184 Chronic kidney disease, stage 4 (severe): Secondary | ICD-10-CM | POA: Diagnosis not present

## 2011-08-27 DIAGNOSIS — D649 Anemia, unspecified: Secondary | ICD-10-CM | POA: Diagnosis not present

## 2011-08-27 DIAGNOSIS — N2581 Secondary hyperparathyroidism of renal origin: Secondary | ICD-10-CM | POA: Diagnosis not present

## 2011-08-27 DIAGNOSIS — I129 Hypertensive chronic kidney disease with stage 1 through stage 4 chronic kidney disease, or unspecified chronic kidney disease: Secondary | ICD-10-CM | POA: Diagnosis not present

## 2011-09-03 DIAGNOSIS — E119 Type 2 diabetes mellitus without complications: Secondary | ICD-10-CM | POA: Diagnosis not present

## 2011-09-03 DIAGNOSIS — H31019 Macula scars of posterior pole (postinflammatory) (post-traumatic), unspecified eye: Secondary | ICD-10-CM | POA: Diagnosis not present

## 2011-09-03 DIAGNOSIS — H40009 Preglaucoma, unspecified, unspecified eye: Secondary | ICD-10-CM | POA: Diagnosis not present

## 2011-09-03 DIAGNOSIS — H5210 Myopia, unspecified eye: Secondary | ICD-10-CM | POA: Diagnosis not present

## 2011-09-17 ENCOUNTER — Encounter: Payer: Self-pay | Admitting: Internal Medicine

## 2011-10-15 DIAGNOSIS — N184 Chronic kidney disease, stage 4 (severe): Secondary | ICD-10-CM | POA: Diagnosis not present

## 2011-10-15 DIAGNOSIS — E119 Type 2 diabetes mellitus without complications: Secondary | ICD-10-CM | POA: Diagnosis not present

## 2011-10-15 DIAGNOSIS — I129 Hypertensive chronic kidney disease with stage 1 through stage 4 chronic kidney disease, or unspecified chronic kidney disease: Secondary | ICD-10-CM | POA: Diagnosis not present

## 2011-10-15 DIAGNOSIS — I10 Essential (primary) hypertension: Secondary | ICD-10-CM | POA: Diagnosis not present

## 2011-10-15 DIAGNOSIS — N2581 Secondary hyperparathyroidism of renal origin: Secondary | ICD-10-CM | POA: Diagnosis not present

## 2011-11-04 ENCOUNTER — Ambulatory Visit (INDEPENDENT_AMBULATORY_CARE_PROVIDER_SITE_OTHER): Payer: Medicare Other | Admitting: Internal Medicine

## 2011-11-04 ENCOUNTER — Encounter: Payer: Self-pay | Admitting: Internal Medicine

## 2011-11-04 VITALS — BP 106/62 | HR 80 | Temp 98.1°F | Ht 69.5 in | Wt 251.1 lb

## 2011-11-04 DIAGNOSIS — I10 Essential (primary) hypertension: Secondary | ICD-10-CM

## 2011-11-04 DIAGNOSIS — E785 Hyperlipidemia, unspecified: Secondary | ICD-10-CM | POA: Diagnosis not present

## 2011-11-04 DIAGNOSIS — N189 Chronic kidney disease, unspecified: Secondary | ICD-10-CM | POA: Diagnosis not present

## 2011-11-04 DIAGNOSIS — E119 Type 2 diabetes mellitus without complications: Secondary | ICD-10-CM | POA: Diagnosis not present

## 2011-11-04 MED ORDER — INSULIN GLARGINE 100 UNIT/ML ~~LOC~~ SOLN: 6.0000 [IU] | Freq: Every day | SUBCUTANEOUS | Status: DC

## 2011-11-04 NOTE — Assessment & Plan Note (Addendum)
stable overall by hx and exam to mild low but asympt, most recent data reviewed with pt, and pt to continue medical treatment as before - pt concerned, but to call for any dizziness or other symtpoms  BP Readings from Last 3 Encounters:  11/04/11 106/62  08/07/11 134/86  05/06/11 132/80

## 2011-11-04 NOTE — Assessment & Plan Note (Addendum)
overcontrolled in the setting of recent renal fxn worsening, to d/c glipizide, re-start lantus at 6 units, cont check cbg's , call for lows, or high > 200 Lab Results  Component Value Date   HGBA1C 9.6* 01/30/2011   For a1c/bmet today, goal a1c < 7

## 2011-11-04 NOTE — Assessment & Plan Note (Signed)
Followed closely per renal, to check f/u with glc as above

## 2011-11-04 NOTE — Patient Instructions (Signed)
Ok to stop the glipizide (glucotrol) Decrease the lantus to 6 units per day Continue all other medications as before Please go to LAB in the Basement for the blood and/or urine tests to be done tomorrow You will be contacted by phone if any changes need to be made immediately.  Otherwise, you will receive a letter about your results with an explanation. Please return in 6 months, or sooner if needed

## 2011-11-04 NOTE — Assessment & Plan Note (Addendum)
stable overall by hx and exam, most recent data reviewed with pt, and pt to continue medical treatment as before  Lab Results  Component Value Date   LDLCALC 106* 08/01/2010   For lipid re-check ,goal ldl < 70

## 2011-11-04 NOTE — Progress Notes (Signed)
Subjective:    Patient ID: Oscar Castillo, male    DOB: May 06, 1951, 61 y.o.   MRN: [id number]  HPI  Here to f/u; unfortunately has had worsening renal fxn followed closely per Dr Kathrene Bongo, with recent more and more frequent low sugars forcing him to decraease the glipizide to 10 mg in the am only, and essentially stopped the lantus though still takes an occasional lower dose of 10-14 units (not the former 18 units).  Pt denies chest pain, increased sob or doe, wheezing, orthopnea, PND, increased LE swelling, palpitations, dizziness or syncope.  Pt denies new neurological symptoms such as new headache, or facial or extremity weakness or numbness   Pt denies polydipsia, polyuria, or low sugar symptoms such as weakness or confusion improved with po intake.  Pt states overall good compliance with meds, trying to follow lower cholesterol, diabetic diet.  No other new complaints.  CBG's essentially on glipizide 10qd only about 160, often less than that with no low sugars for a few days.  Recent labs per renal Mar 2013 with cr in high 4's Past Medical History  Diagnosis Date  . Abdominal pain, generalized 05/05/2007  . ABDOMINAL TENDERNESS 11/06/2009  . ABDOMINAL ULTRASOUND, ABNORMAL 04/23/2010  . ALLERGIC RHINITIS 05/05/2007  . BACK PAIN, LUMBAR, CHRONIC 08/06/2009  . BENIGN PROSTATIC HYPERTROPHY 08/01/2010  . CEREBROVASCULAR ACCIDENT, HX OF 08/06/2009  . CHOLELITHIASIS 08/01/2010  . CHRONIC KIDNEY DISEASE UNSPECIFIED 11/06/2009  . CONGESTIVE HEART FAILURE 03/18/2009  . DEPRESSION 03/18/2009  . DIABETES MELLITUS, TYPE II 03/25/2007  . ERECTILE DYSFUNCTION 03/25/2007  . FLANK PAIN, RIGHT 11/06/2009  . GERD 03/25/2007  . HEPATITIS C, HX OF 03/25/2007  . HYPERTENSION 03/25/2007  . Morbid obesity 03/25/2007  . NEPHROLITHIASIS, HX OF 03/18/2009  . RENAL INSUFFICIENCY 08/06/2009  . SHOULDER PAIN, RIGHT 09/18/2009  . THUMB PAIN, RIGHT 04/23/2010  . WEAKNESS 04/23/2010   Past Surgical History  Procedure Date  .  Nephrectomy     partial RR    reports that he has never smoked. He does not have any smokeless tobacco history on file. He reports that he does not drink alcohol or use illicit drugs. family history includes Coronary artery disease in his other and Diabetes in his other. No Known Allergies Current Outpatient Prescriptions on File Prior to Visit  Medication Sig Dispense Refill  . amLODipine (NORVASC) 10 MG tablet Take 1 tablet (10 mg total) by mouth daily.  90 tablet  3  . aspirin 81 MG EC tablet Take 81 mg by mouth daily.        . cloNIDine (CATAPRES) 0.1 MG tablet Take 1 tablet (0.1 mg total) by mouth 2 (two) times daily.  180 tablet  3  . furosemide (LASIX) 40 MG tablet Take 1 tablet (40 mg total) by mouth 2 (two) times daily.  60 tablet  11  . glucose blood (FREESTYLE LITE) test strip Use as directed  once daily       . hydrALAZINE (APRESOLINE) 100 MG tablet Take 1 tablet (100 mg total) by mouth 3 (three) times daily.  90 tablet  11  . Insulin Pen Needle (PEN NEEDLES) 29G X MISC Use as directed  100 each  11  . labetalol (NORMODYNE) 300 MG tablet Take 1 tablet (300 mg total) by mouth 2 (two) times daily.  180 tablet  3  . Lancets MISC Use as directed once daily       . polyethylene glycol powder (MIRALAX) powder Take 17 g by mouth  daily.        . pravastatin (PRAVACHOL) 20 MG tablet Take one (1) tablet(s) once daily  90 tablet  3  . traMADol (ULTRAM) 50 MG tablet Take 1 tablet (50 mg total) by mouth 4 (four) times daily as needed.  120 tablet  2  . DISCONTD: insulin glargine (LANTUS SOLOSTAR) 100 UNIT/ML injection Inject 18 Units into the skin at bedtime.  3 mL  12   Review of Systems Review of Systems  Constitutional: Negative for diaphoresis and unexpected weight change.  Gastrointestinal: Negative for vomiting and blood in stool.  Genitourinary: Negative for hematuria and decreased urine volume.  Musculoskeletal: Negative for gait problem.  Skin: Negative for color change and  wound.  Neurological: Negative for tremors and numbness.  Psychiatric/Behavioral: Negative for decreased concentration. The patient is not hyperactive.       Objective:   Physical Exam BP 106/62  Pulse 80  Temp(Src) 98.1 F (36.7 C) (Oral)  Ht 5' 9.5" (1.765 m)  Wt 251 lb 2 oz (113.91 kg)  BMI 36.55 kg/m2  SpO2 97% Physical Exam  VS noted, obese Constitutional: Pt appears well-developed and well-nourished.  HENT: Head: Normocephalic.  Right Ear: External ear normal.  Left Ear: External ear normal.  Eyes: Conjunctivae and EOM are normal. Pupils are equal, round, and reactive to light.  Neck: Normal range of motion. Neck supple.  Cardiovascular: Normal rate and regular rhythm.   Pulmonary/Chest: Effort normal and breath sounds normal.  Abd:  Soft, NT, non-distended, + BS Psychiatric: Pt behavior is normal. Thought content normal. mild nervous    Assessment & Plan:

## 2011-11-05 ENCOUNTER — Other Ambulatory Visit (INDEPENDENT_AMBULATORY_CARE_PROVIDER_SITE_OTHER): Payer: Medicare Other

## 2011-11-05 ENCOUNTER — Telehealth: Payer: Self-pay | Admitting: *Deleted

## 2011-11-05 DIAGNOSIS — E119 Type 2 diabetes mellitus without complications: Secondary | ICD-10-CM | POA: Diagnosis not present

## 2011-11-05 LAB — BASIC METABOLIC PANEL
BUN: 46 mg/dL — ABNORMAL HIGH (ref 6–23)
Chloride: 105 mEq/L (ref 96–112)
Glucose, Bld: 299 mg/dL — ABNORMAL HIGH (ref 70–99)
Potassium: 4.4 mEq/L (ref 3.5–5.1)

## 2011-11-05 LAB — LIPID PANEL: Cholesterol: 175 mg/dL (ref 0–200)

## 2011-11-05 NOTE — Telephone Encounter (Signed)
Lab called with critical on pt-Creatinine 5.7, GFR 13.04

## 2011-11-06 ENCOUNTER — Encounter: Payer: Self-pay | Admitting: Internal Medicine

## 2011-11-24 DIAGNOSIS — H409 Unspecified glaucoma: Secondary | ICD-10-CM | POA: Diagnosis not present

## 2011-11-24 DIAGNOSIS — H4011X0 Primary open-angle glaucoma, stage unspecified: Secondary | ICD-10-CM | POA: Diagnosis not present

## 2011-12-28 DIAGNOSIS — H409 Unspecified glaucoma: Secondary | ICD-10-CM | POA: Diagnosis not present

## 2011-12-28 DIAGNOSIS — H4011X0 Primary open-angle glaucoma, stage unspecified: Secondary | ICD-10-CM | POA: Diagnosis not present

## 2012-01-04 DIAGNOSIS — N184 Chronic kidney disease, stage 4 (severe): Secondary | ICD-10-CM | POA: Diagnosis not present

## 2012-01-04 DIAGNOSIS — I10 Essential (primary) hypertension: Secondary | ICD-10-CM | POA: Diagnosis not present

## 2012-01-04 DIAGNOSIS — I129 Hypertensive chronic kidney disease with stage 1 through stage 4 chronic kidney disease, or unspecified chronic kidney disease: Secondary | ICD-10-CM | POA: Diagnosis not present

## 2012-01-04 DIAGNOSIS — N2581 Secondary hyperparathyroidism of renal origin: Secondary | ICD-10-CM | POA: Diagnosis not present

## 2012-01-04 DIAGNOSIS — E119 Type 2 diabetes mellitus without complications: Secondary | ICD-10-CM | POA: Diagnosis not present

## 2012-01-04 DIAGNOSIS — D649 Anemia, unspecified: Secondary | ICD-10-CM | POA: Diagnosis not present

## 2012-03-07 DIAGNOSIS — H4011X0 Primary open-angle glaucoma, stage unspecified: Secondary | ICD-10-CM | POA: Diagnosis not present

## 2012-03-07 DIAGNOSIS — H409 Unspecified glaucoma: Secondary | ICD-10-CM | POA: Diagnosis not present

## 2012-03-22 ENCOUNTER — Other Ambulatory Visit: Payer: Self-pay | Admitting: Internal Medicine

## 2012-03-22 NOTE — Telephone Encounter (Signed)
Done erx 

## 2012-04-07 DIAGNOSIS — H4011X0 Primary open-angle glaucoma, stage unspecified: Secondary | ICD-10-CM | POA: Diagnosis not present

## 2012-04-07 DIAGNOSIS — H409 Unspecified glaucoma: Secondary | ICD-10-CM | POA: Diagnosis not present

## 2012-04-08 ENCOUNTER — Other Ambulatory Visit: Payer: Self-pay

## 2012-04-08 MED ORDER — CLONIDINE HCL 0.1 MG PO TABS: 0.1000 mg | ORAL_TABLET | Freq: Two times a day (BID) | ORAL | Status: DC

## 2012-04-29 DIAGNOSIS — I10 Essential (primary) hypertension: Secondary | ICD-10-CM | POA: Diagnosis not present

## 2012-04-29 DIAGNOSIS — N2581 Secondary hyperparathyroidism of renal origin: Secondary | ICD-10-CM | POA: Diagnosis not present

## 2012-04-29 DIAGNOSIS — I129 Hypertensive chronic kidney disease with stage 1 through stage 4 chronic kidney disease, or unspecified chronic kidney disease: Secondary | ICD-10-CM | POA: Diagnosis not present

## 2012-04-29 DIAGNOSIS — N184 Chronic kidney disease, stage 4 (severe): Secondary | ICD-10-CM | POA: Diagnosis not present

## 2012-06-01 ENCOUNTER — Other Ambulatory Visit (HOSPITAL_COMMUNITY): Payer: Self-pay | Admitting: Urology

## 2012-06-01 DIAGNOSIS — N281 Cyst of kidney, acquired: Secondary | ICD-10-CM

## 2012-06-11 ENCOUNTER — Encounter (HOSPITAL_COMMUNITY): Payer: Self-pay

## 2012-06-11 ENCOUNTER — Inpatient Hospital Stay (HOSPITAL_COMMUNITY)
Admission: EM | Admit: 2012-06-11 | Discharge: 2012-06-21 | DRG: 264 | Disposition: A | Discharge status: Home / Self Care | Payer: Medicare Other | Attending: Internal Medicine | Admitting: Internal Medicine

## 2012-06-11 ENCOUNTER — Emergency Department (HOSPITAL_COMMUNITY): Payer: Medicare Other

## 2012-06-11 DIAGNOSIS — N2581 Secondary hyperparathyroidism of renal origin: Secondary | ICD-10-CM | POA: Diagnosis not present

## 2012-06-11 DIAGNOSIS — D631 Anemia in chronic kidney disease: Secondary | ICD-10-CM | POA: Diagnosis present

## 2012-06-11 DIAGNOSIS — I5041 Acute combined systolic (congestive) and diastolic (congestive) heart failure: Secondary | ICD-10-CM

## 2012-06-11 DIAGNOSIS — Z0000 Encounter for general adult medical examination without abnormal findings: Secondary | ICD-10-CM

## 2012-06-11 DIAGNOSIS — E119 Type 2 diabetes mellitus without complications: Secondary | ICD-10-CM

## 2012-06-11 DIAGNOSIS — M25519 Pain in unspecified shoulder: Secondary | ICD-10-CM

## 2012-06-11 DIAGNOSIS — N189 Chronic kidney disease, unspecified: Secondary | ICD-10-CM

## 2012-06-11 DIAGNOSIS — I5043 Acute on chronic combined systolic (congestive) and diastolic (congestive) heart failure: Principal | ICD-10-CM | POA: Diagnosis present

## 2012-06-11 DIAGNOSIS — R5381 Other malaise: Secondary | ICD-10-CM

## 2012-06-11 DIAGNOSIS — Z87442 Personal history of urinary calculi: Secondary | ICD-10-CM

## 2012-06-11 DIAGNOSIS — J309 Allergic rhinitis, unspecified: Secondary | ICD-10-CM

## 2012-06-11 DIAGNOSIS — R109 Unspecified abdominal pain: Secondary | ICD-10-CM

## 2012-06-11 DIAGNOSIS — I509 Heart failure, unspecified: Secondary | ICD-10-CM

## 2012-06-11 DIAGNOSIS — F3289 Other specified depressive episodes: Secondary | ICD-10-CM

## 2012-06-11 DIAGNOSIS — Z8619 Personal history of other infectious and parasitic diseases: Secondary | ICD-10-CM

## 2012-06-11 DIAGNOSIS — F528 Other sexual dysfunction not due to a substance or known physiological condition: Secondary | ICD-10-CM

## 2012-06-11 DIAGNOSIS — F329 Major depressive disorder, single episode, unspecified: Secondary | ICD-10-CM

## 2012-06-11 DIAGNOSIS — N179 Acute kidney failure, unspecified: Secondary | ICD-10-CM

## 2012-06-11 DIAGNOSIS — N186 End stage renal disease: Secondary | ICD-10-CM | POA: Diagnosis present

## 2012-06-11 DIAGNOSIS — D638 Anemia in other chronic diseases classified elsewhere: Secondary | ICD-10-CM

## 2012-06-11 DIAGNOSIS — Z452 Encounter for adjustment and management of vascular access device: Secondary | ICD-10-CM | POA: Diagnosis not present

## 2012-06-11 DIAGNOSIS — I517 Cardiomegaly: Secondary | ICD-10-CM

## 2012-06-11 DIAGNOSIS — B182 Chronic viral hepatitis C: Secondary | ICD-10-CM | POA: Diagnosis present

## 2012-06-11 DIAGNOSIS — J984 Other disorders of lung: Secondary | ICD-10-CM | POA: Diagnosis not present

## 2012-06-11 DIAGNOSIS — R5383 Other fatigue: Secondary | ICD-10-CM

## 2012-06-11 DIAGNOSIS — N185 Chronic kidney disease, stage 5: Secondary | ICD-10-CM | POA: Diagnosis not present

## 2012-06-11 DIAGNOSIS — K219 Gastro-esophageal reflux disease without esophagitis: Secondary | ICD-10-CM

## 2012-06-11 DIAGNOSIS — M79609 Pain in unspecified limb: Secondary | ICD-10-CM

## 2012-06-11 DIAGNOSIS — I120 Hypertensive chronic kidney disease with stage 5 chronic kidney disease or end stage renal disease: Secondary | ICD-10-CM | POA: Diagnosis not present

## 2012-06-11 DIAGNOSIS — Z8679 Personal history of other diseases of the circulatory system: Secondary | ICD-10-CM

## 2012-06-11 DIAGNOSIS — R0602 Shortness of breath: Secondary | ICD-10-CM | POA: Diagnosis not present

## 2012-06-11 DIAGNOSIS — I10 Essential (primary) hypertension: Secondary | ICD-10-CM

## 2012-06-11 DIAGNOSIS — R609 Edema, unspecified: Secondary | ICD-10-CM

## 2012-06-11 DIAGNOSIS — M545 Low back pain, unspecified: Secondary | ICD-10-CM

## 2012-06-11 DIAGNOSIS — N400 Enlarged prostate without lower urinary tract symptoms: Secondary | ICD-10-CM

## 2012-06-11 DIAGNOSIS — B1920 Unspecified viral hepatitis C without hepatic coma: Secondary | ICD-10-CM | POA: Diagnosis present

## 2012-06-11 DIAGNOSIS — E785 Hyperlipidemia, unspecified: Secondary | ICD-10-CM

## 2012-06-11 DIAGNOSIS — N320 Bladder-neck obstruction: Secondary | ICD-10-CM

## 2012-06-11 DIAGNOSIS — I129 Hypertensive chronic kidney disease with stage 1 through stage 4 chronic kidney disease, or unspecified chronic kidney disease: Secondary | ICD-10-CM | POA: Diagnosis not present

## 2012-06-11 DIAGNOSIS — N19 Unspecified kidney failure: Secondary | ICD-10-CM

## 2012-06-11 DIAGNOSIS — R935 Abnormal findings on diagnostic imaging of other abdominal regions, including retroperitoneum: Secondary | ICD-10-CM

## 2012-06-11 DIAGNOSIS — Z992 Dependence on renal dialysis: Secondary | ICD-10-CM | POA: Diagnosis not present

## 2012-06-11 DIAGNOSIS — K8020 Calculus of gallbladder without cholecystitis without obstruction: Secondary | ICD-10-CM

## 2012-06-11 DIAGNOSIS — Z8673 Personal history of transient ischemic attack (TIA), and cerebral infarction without residual deficits: Secondary | ICD-10-CM

## 2012-06-11 LAB — CBC WITH DIFFERENTIAL/PLATELET
Basophils Relative: 0 % (ref 0–1)
HCT: 30.8 % — ABNORMAL LOW (ref 39.0–52.0)
Hemoglobin: 10.1 g/dL — ABNORMAL LOW (ref 13.0–17.0)
MCH: 27.4 pg (ref 26.0–34.0)
MCHC: 32.8 g/dL (ref 30.0–36.0)
Monocytes Absolute: 0.3 10*3/uL (ref 0.1–1.0)
Monocytes Relative: 5 % (ref 3–12)
Neutro Abs: 3.5 10*3/uL (ref 1.7–7.7)

## 2012-06-11 LAB — POCT I-STAT, CHEM 8
Creatinine, Ser: 8.7 mg/dL — ABNORMAL HIGH (ref 0.50–1.35)
HCT: 31 % — ABNORMAL LOW (ref 39.0–52.0)
Hemoglobin: 10.5 g/dL — ABNORMAL LOW (ref 13.0–17.0)
Potassium: 4.5 mEq/L (ref 3.5–5.1)
Sodium: 144 mEq/L (ref 135–145)

## 2012-06-11 LAB — IRON AND TIBC: Iron: 59 ug/dL (ref 42–135)

## 2012-06-11 LAB — POCT I-STAT TROPONIN I: Troponin i, poc: 0 ng/mL (ref 0.00–0.08)

## 2012-06-11 LAB — GLUCOSE, CAPILLARY: Glucose-Capillary: 69 mg/dL — ABNORMAL LOW (ref 70–99)

## 2012-06-11 LAB — VITAMIN B12: Vitamin B-12: 314 pg/mL (ref 211–911)

## 2012-06-11 LAB — HEPATITIS B CORE ANTIBODY, IGM: Hep B C IgM: NEGATIVE

## 2012-06-11 MED ORDER — INSULIN ASPART 100 UNIT/ML ~~LOC~~ SOLN: 0.0000 [IU] | Freq: Every day | SUBCUTANEOUS | Status: DC

## 2012-06-11 MED ORDER — TRAVOPROST (BAK FREE) 0.004 % OP SOLN
1.0000 [drp] | Freq: Every day | OPHTHALMIC | Status: DC
  Administered 2012-06-11 – 2012-06-20 (×10): 1 [drp] via OPHTHALMIC
  Filled 2012-06-11: qty 2.5

## 2012-06-11 MED ORDER — FUROSEMIDE 10 MG/ML IJ SOLN: 160.0000 mg | Freq: Once | INTRAMUSCULAR | Status: DC

## 2012-06-11 MED ORDER — HYDRALAZINE HCL 20 MG/ML IJ SOLN
INTRAMUSCULAR | Status: AC
  Filled 2012-06-11: qty 1

## 2012-06-11 MED ORDER — NITROGLYCERIN IN D5W 200-5 MCG/ML-% IV SOLN
10.0000 ug/min | INTRAVENOUS | Status: DC
  Administered 2012-06-11: 10 ug/min via INTRAVENOUS
  Administered 2012-06-11: 70 ug/min via INTRAVENOUS
  Administered 2012-06-11: 50 ug/min via INTRAVENOUS
  Filled 2012-06-11: qty 250

## 2012-06-11 MED ORDER — HYDRALAZINE HCL 100 MG PO TABS: 100.0000 mg | ORAL_TABLET | Freq: Three times a day (TID) | ORAL | Status: DC

## 2012-06-11 MED ORDER — INSULIN GLARGINE 100 UNIT/ML ~~LOC~~ SOLN: 5.0000 [IU] | Freq: Every day | SUBCUTANEOUS | Status: DC

## 2012-06-11 MED ORDER — BRINZOLAMIDE-BRIMONIDINE 1-0.2 % OP SUSP: 1.0000 [drp] | Freq: Two times a day (BID) | OPHTHALMIC | Status: DC

## 2012-06-11 MED ORDER — SODIUM CHLORIDE 0.9 % IJ SOLN: 3.0000 mL | INTRAMUSCULAR | Status: DC | PRN

## 2012-06-11 MED ORDER — ASPIRIN EC 81 MG PO TBEC: 81.0000 mg | DELAYED_RELEASE_TABLET | Freq: Every day | ORAL | Status: DC

## 2012-06-11 MED ORDER — ASPIRIN 325 MG PO TABS
325.0000 mg | ORAL_TABLET | Freq: Once | ORAL | Status: AC
  Administered 2012-06-11: 325 mg via ORAL
  Filled 2012-06-11: qty 1

## 2012-06-11 MED ORDER — NITROGLYCERIN IN D5W 200-5 MCG/ML-% IV SOLN
20.0000 ug/min | Freq: Once | INTRAVENOUS | Status: AC
  Administered 2012-06-11: 20 ug/min via INTRAVENOUS

## 2012-06-11 MED ORDER — CARVEDILOL 3.125 MG PO TABS
3.1250 mg | ORAL_TABLET | Freq: Two times a day (BID) | ORAL | Status: DC
  Administered 2012-06-12: 3.125 mg via ORAL
  Filled 2012-06-11 (×4): qty 1

## 2012-06-11 MED ORDER — TRAMADOL HCL 50 MG PO TABS
50.0000 mg | ORAL_TABLET | Freq: Four times a day (QID) | ORAL | Status: DC | PRN
  Filled 2012-06-11: qty 1

## 2012-06-11 MED ORDER — SODIUM CHLORIDE 0.9 % IV SOLN
250.0000 mL | INTRAVENOUS | Status: DC | PRN
  Administered 2012-06-17: 250 mL via INTRAVENOUS

## 2012-06-11 MED ORDER — NITROGLYCERIN 0.4 MG SL SUBL: 0.4000 mg | SUBLINGUAL_TABLET | SUBLINGUAL | Status: DC | PRN

## 2012-06-11 MED ORDER — DEXTROSE 5 % IV SOLN
500.0000 mg | Freq: Once | INTRAVENOUS | Status: AC
  Administered 2012-06-11: 500 mg via INTRAVENOUS
  Filled 2012-06-11: qty 500

## 2012-06-11 MED ORDER — FUROSEMIDE 10 MG/ML IJ SOLN
160.0000 mg | Freq: Four times a day (QID) | INTRAVENOUS | Status: DC
  Administered 2012-06-12 – 2012-06-18 (×23): 160 mg via INTRAVENOUS
  Filled 2012-06-11 (×31): qty 16

## 2012-06-11 MED ORDER — BRIMONIDINE TARTRATE 0.2 % OP SOLN
1.0000 [drp] | Freq: Two times a day (BID) | OPHTHALMIC | Status: DC
  Administered 2012-06-11 – 2012-06-21 (×19): 1 [drp] via OPHTHALMIC
  Filled 2012-06-11: qty 5

## 2012-06-11 MED ORDER — SIMVASTATIN 10 MG PO TABS
10.0000 mg | ORAL_TABLET | Freq: Every day | ORAL | Status: DC
  Administered 2012-06-12 – 2012-06-13 (×2): 10 mg via ORAL
  Filled 2012-06-11 (×3): qty 1

## 2012-06-11 MED ORDER — ASPIRIN EC 81 MG PO TBEC
81.0000 mg | DELAYED_RELEASE_TABLET | Freq: Every day | ORAL | Status: DC
  Administered 2012-06-11 – 2012-06-21 (×11): 81 mg via ORAL
  Filled 2012-06-11 (×11): qty 1

## 2012-06-11 MED ORDER — HEPARIN SODIUM (PORCINE) 5000 UNIT/ML IJ SOLN
5000.0000 [IU] | Freq: Three times a day (TID) | INTRAMUSCULAR | Status: DC
  Administered 2012-06-11 – 2012-06-21 (×27): 5000 [IU] via SUBCUTANEOUS
  Filled 2012-06-11 (×32): qty 1

## 2012-06-11 MED ORDER — ACETAMINOPHEN 325 MG PO TABS
650.0000 mg | ORAL_TABLET | ORAL | Status: DC | PRN
  Administered 2012-06-14 – 2012-06-19 (×2): 650 mg via ORAL
  Filled 2012-06-11 (×2): qty 2

## 2012-06-11 MED ORDER — BRINZOLAMIDE 1 % OP SUSP
1.0000 [drp] | Freq: Two times a day (BID) | OPHTHALMIC | Status: DC
  Administered 2012-06-11 – 2012-06-21 (×19): 1 [drp] via OPHTHALMIC
  Filled 2012-06-11: qty 10

## 2012-06-11 MED ORDER — ASPIRIN 81 MG PO TBEC: 81.0000 mg | DELAYED_RELEASE_TABLET | Freq: Every day | ORAL | Status: DC

## 2012-06-11 MED ORDER — SODIUM CHLORIDE 0.9 % IJ SOLN
3.0000 mL | Freq: Two times a day (BID) | INTRAMUSCULAR | Status: DC
  Administered 2012-06-11 – 2012-06-21 (×14): 3 mL via INTRAVENOUS

## 2012-06-11 MED ORDER — NITROGLYCERIN IN D5W 200-5 MCG/ML-% IV SOLN
2.0000 ug/min | INTRAVENOUS | Status: DC
  Administered 2012-06-12: 30 ug/min via INTRAVENOUS
  Filled 2012-06-11 (×2): qty 250

## 2012-06-11 MED ORDER — HYDRALAZINE HCL 20 MG/ML IJ SOLN
10.0000 mg | Freq: Once | INTRAMUSCULAR | Status: AC
Start: 2012-06-11 — End: 2012-06-11
  Administered 2012-06-11: 10 mg via INTRAVENOUS

## 2012-06-11 MED ORDER — FUROSEMIDE 10 MG/ML IJ SOLN
120.0000 mg | Freq: Three times a day (TID) | INTRAVENOUS | Status: DC
  Filled 2012-06-11 (×2): qty 12

## 2012-06-11 MED ORDER — HYDRALAZINE HCL 50 MG PO TABS
100.0000 mg | ORAL_TABLET | Freq: Three times a day (TID) | ORAL | Status: DC
  Administered 2012-06-11 – 2012-06-21 (×27): 100 mg via ORAL
  Filled 2012-06-11 (×32): qty 2

## 2012-06-11 MED ORDER — ONDANSETRON HCL 4 MG/2ML IJ SOLN
4.0000 mg | Freq: Four times a day (QID) | INTRAMUSCULAR | Status: DC | PRN
  Administered 2012-06-16 – 2012-06-17 (×2): 4 mg via INTRAVENOUS
  Filled 2012-06-11 (×2): qty 2

## 2012-06-11 MED ORDER — FUROSEMIDE 10 MG/ML IJ SOLN
120.0000 mg | Freq: Once | INTRAVENOUS | Status: AC
  Administered 2012-06-11: 120 mg via INTRAVENOUS
  Filled 2012-06-11: qty 12

## 2012-06-11 MED ORDER — INSULIN ASPART 100 UNIT/ML ~~LOC~~ SOLN
0.0000 [IU] | Freq: Three times a day (TID) | SUBCUTANEOUS | Status: DC
  Administered 2012-06-13 (×2): 4 [IU] via SUBCUTANEOUS

## 2012-06-11 MED ORDER — FUROSEMIDE 10 MG/ML IJ SOLN
160.0000 mg | Freq: Four times a day (QID) | INTRAVENOUS | Status: DC
  Filled 2012-06-11 (×3): qty 16

## 2012-06-11 NOTE — ED Notes (Signed)
Verified with Dr. Arbutus Leas that he does want Nitro gtt restarted.  Verbal order given with readback that Nitro gtt will be 20 mcg/min.  Order entered and gtt will be restarted.

## 2012-06-11 NOTE — Consult Note (Signed)
Reason for Consult:Hypertensive urgency and progressive CKD stage 5 Referring Physician: Catarina Hartshorn, MD  Oscar Castillo is an 61 y.o. male.  HPI: Pt is a 61yo AAM with PMH sig for longstanding, poorly controlled HTN, DM, and progressive CKD who has declined any pre-dialysis education or preparation who presents with a 1 week h/o malaise, SOB, DOE, and increasing lower ext edema.  Pt was last seen by Dr. Kathrene Bongo on 04/29/12 and again discussed RRT and vascular access with him at that time.  He again declined anything.  His creatinine was 9.02 mg/dL at that time.  He now presents with BP of 196/100, pulmonary edema and gross volume overload.  We were asked to help evaluate and manage his progressive CKD and hypertensive urgency.    Of note, he admits to taking alleve over the past week due to malaise and HA.  Trend in Creatinine: Creatinine, Ser  Date/Time Value Range Status  06/11/2012  9:26 AM 8.70* 0.50 - 1.35 mg/dL Final  1/61/0960 45:40 AM 5.7* 0.4 - 1.5 mg/dL Final  98/05/9146  8:29 PM 3.9* 0.4 - 1.5 mg/dL Final  11/29/2128 86:57 AM 3.7* 0.4 - 1.5 mg/dL Final  02/27/6961  9:52 AM 3.0* 0.4 - 1.5 mg/dL Final  8/41/3244 01:02 AM 2.5* 0.4-1.5 mg/dL Final  02/18/3663  4:03 AM 1.7* 0.4-1.5 mg/dL Final  4/74/2595 63:87 AM 1.9* 0.4-1.5 mg/dL Final  5/64/3329 51:88 AM 1.8* 0.4-1.5 mg/dL Final  11/09/6061  0:16 PM 1.9* 0.4-1.5 mg/dL Final  0/07/930  3:55 AM 1.27   Final  09/24/2007  5:44 AM 1.29   Final  09/23/2007  5:50 AM 1.39   Final  09/12/2007  6:30 AM 1.09   Final  09/06/2007  6:10 AM 1.14   Final  08/28/2007  9:28 AM 1.29   Final  08/25/2007  5:25 AM 1.80*  Final  08/24/2007  5:24 AM 1.59*  Final  08/23/2007 12:53 PM 1.84*  Final  05/05/2007 11:59 AM 1.6* 0.4-1.5 mg/dL Final    PMH:   Past Medical History  Diagnosis Date  . Abdominal pain, generalized 05/05/2007  . ABDOMINAL TENDERNESS 11/06/2009  . ABDOMINAL ULTRASOUND, ABNORMAL 04/23/2010  . ALLERGIC RHINITIS 05/05/2007  . BACK PAIN, LUMBAR,  CHRONIC 08/06/2009  . BENIGN PROSTATIC HYPERTROPHY 08/01/2010  . CEREBROVASCULAR ACCIDENT, HX OF 08/06/2009  . CHOLELITHIASIS 08/01/2010  . CHRONIC KIDNEY DISEASE UNSPECIFIED 11/06/2009  . CONGESTIVE HEART FAILURE 03/18/2009  . DEPRESSION 03/18/2009  . DIABETES MELLITUS, TYPE II 03/25/2007  . ERECTILE DYSFUNCTION 03/25/2007  . FLANK PAIN, RIGHT 11/06/2009  . GERD 03/25/2007  . HEPATITIS C, HX OF 03/25/2007  . HYPERTENSION 03/25/2007  . Morbid obesity 03/25/2007  . NEPHROLITHIASIS, HX OF 03/18/2009  . RENAL INSUFFICIENCY 08/06/2009  . SHOULDER PAIN, RIGHT 09/18/2009  . THUMB PAIN, RIGHT 04/23/2010  . WEAKNESS 04/23/2010    PSH:   Past Surgical History  Procedure Date  . Nephrectomy     partial RR    Allergies: No Known Allergies  Medications:   Prior to Admission medications   Medication Sig Start Date End Date Taking? Authorizing Provider  amLODipine (NORVASC) 10 MG tablet Take 1 tablet (10 mg total) by mouth daily. 05/27/11  Yes Corwin Levins, MD  aspirin 81 MG EC tablet Take 81 mg by mouth daily.     Yes Historical Provider, MD  Brinzolamide-Brimonidine [facility]) 1-0.2 % SUSP Place 1 drop into the right eye 2 (two) times daily.   Yes Historical Provider, MD  cloNIDine (CATAPRES) 0.1 MG tablet Take  1 tablet (0.1 mg total) by mouth 2 (two) times daily. 04/08/12  Yes Corwin Levins, MD  furosemide (LASIX) 40 MG tablet Take 80 mg by mouth 2 (two) times daily.   Yes Historical Provider, MD  glipiZIDE (GLUCOTROL) 10 MG tablet Take 10 mg by mouth daily as needed. If sugar is high   Yes Historical Provider, MD  hydrALAZINE (APRESOLINE) 100 MG tablet Take 1 tablet (100 mg total) by mouth 3 (three) times daily. 08/07/11  Yes Corwin Levins, MD  insulin glargine (LANTUS) 100 UNIT/ML injection Inject 10-11 Units into the skin at bedtime.   Yes Historical Provider, MD  labetalol (NORMODYNE) 300 MG tablet Take 1 tablet (300 mg total) by mouth 2 (two) times daily. 06/01/11  Yes Corwin Levins, MD  pravastatin  (PRAVACHOL) 20 MG tablet Take one (1) tablet(s) once daily 11/26/10  Yes Corwin Levins, MD  traMADol (ULTRAM) 50 MG tablet Take 50 mg by mouth 4 (four) times daily as needed. For pain   Yes Historical Provider, MD  Travoprost, BAK Free, (TRAVATAN) 0.004 % SOLN ophthalmic solution Place 1 drop into both eyes at bedtime.   Yes Historical Provider, MD    Discontinued Meds:   Medications Discontinued During This Encounter  Medication Reason  . Lancets MISC Inpatient Standard  . glucose blood (FREESTYLE LITE) test strip Inpatient Standard  . Insulin Pen Needle (PEN NEEDLES) 29G X MISC Inpatient Standard  . traMADol (ULTRAM) 50 MG tablet Inpatient Standard  . furosemide (LASIX) 40 MG tablet Duplicate  . polyethylene glycol powder (MIRALAX) powder Patient has not taken in last 30 days  . insulin glargine (LANTUS SOLOSTAR) 100 UNIT/ML injection Inpatient Standard  . latanoprost (XALATAN) 0.005 % ophthalmic solution Entry Error  . furosemide (LASIX) 160 mg in dextrose 5 % 50 mL IVPB   . nitroGLYCERIN 0.2 mg/mL in dextrose 5 % infusion     Social History:  reports that he has never smoked. He does not have any smokeless tobacco history on file. He reports that he does not drink alcohol or use illicit drugs.  Family History:   Family History  Problem Relation Age of Onset  . Coronary artery disease Other   . Diabetes Other     Pertinent items are noted in HPI. Weight change:  No intake or output data in the 24 hours ending 06/11/12 1601  General appearance: cooperative, fatigued and mild distress Head: Normocephalic, without obvious abnormality, atraumatic Eyes: negative findings: lids and lashes normal, conjunctivae and sclerae normal, corneas clear and pupils equal, round, reactive to light and accomodation Neck: no adenopathy, no carotid bruit, no JVD, supple, symmetrical, trachea midline and thyroid not enlarged, symmetric, no tenderness/mass/nodules Resp: rales bilaterally Cardio:  regular rate and rhythm and no rub GI: soft, non-tender; bowel sounds normal; no masses,  no organomegaly Extremities: edema 3+ edema Neurologic: Grossly normal  Labs: Basic Metabolic Panel:  Lab 06/11/12 1914  NA 144  K 4.5  CL 113*  CO2 --  GLUCOSE 101*  BUN 70*  CREATININE 8.70*  ALBUMIN --  CALCIUM --  PHOS --   Liver Function Tests: No results found for this basename: AST:3,ALT:3,ALKPHOS:3,BILITOT:3,PROT:3,ALBUMIN:3 in the last 168 hours No results found for this basename: LIPASE:3,AMYLASE:3 in the last 168 hours No results found for this basename: AMMONIA:3 in the last 168 hours CBC:  Lab 06/11/12 0926 06/11/12 0902  WBC -- 5.8  NEUTROABS -- 3.5  HGB 10.5* 10.1*  HCT 31.0* 30.8*  MCV -- 83.7  PLT -- 277   PT/INR: @labrcntip (inr:5) Cardiac Enzymes: No results found for this basename: CKTOTAL:5,CKMB:5,CKMBINDEX:5,TROPONINI:5 in the last 168 hours CBG: No results found for this basename: GLUCAP:5 in the last 168 hours  Iron Studies: No results found for this basename: IRON:30,TIBC:30,TRANSFERRIN:30,FERRITIN:30 in the last 168 hours  Xrays/Other Studies: Dg Chest 2 View  06/11/2012  *RADIOLOGY REPORT*  Clinical Data: Shortness of breath worsening over the past week. Right chest pressure.  CHEST - 2 VIEW  Comparison: Chest radiograph 08/23/2007  Findings: Two views of the chest demonstrate mild interstitial prominence in the lower lungs.  There is blunting at the left costophrenic angles suggesting volume loss and cannot exclude a small effusion.  Lateral view demonstrates a small area of consolidation involving either the right lower lobe or the posterior right middle lobe.  Linear density in the left mid lung suggestive for subsegmental atelectasis.  Heart size appears stable.  Trachea is midline.  IMPRESSION: Interstitial prominence and patchy densities at the lung bases.  A small area of consolidation at the right base as described. Findings could represent a  combination of mild edema and volume loss.  An infectious etiology cannot be excluded, particularly at the right lung base.  Recommend follow-up to ensure resolution.   Original Report Authenticated By: Richarda Overlie, M.D.      Assessment/Plan: 1.  Hypertensive urgency- agree with aggressive diuresis and anti-hypertensive therapy.  He declines HD at this time despite a lengthy discussion on the progression of his CKD and volume overload.  He wants to try the "fluid pills" first.   2. Progressive CKD stage 5 not yet on dialysis.  Again he declines any RRT at this time despite a lengthy discussion regarding the chronicity and severity of his disease as well as his current pulmonary edema and hypertensive urgency.  Will cont to educate patient and will order vascular studies and VVS consult.  He would greatly benefit from initiation of HD and hopefully he will not suffer an untoward event before he accepts this therapy 3. CHF- diurese, r/o for MI and evaluate EF per primary svc 4. DM- per Primary svc 5. Anemia of chronic disease- check iron stores and consider ESA once volume/BP are under better control 6. Vascular access- as above 7. Hep C- check LFT's 8. Hypercholesterolemia- per primary svc 9. SHPTH- cont with [id number] daily and follow Phos/ca levels 10.    Mendy Chou A 06/11/2012, 4:01 PM

## 2012-06-11 NOTE — ED Notes (Signed)
RRT and Dr. Fonnie Jarvis in agreement that pt does not need Bi-pap at this time.  Will continue to monitor pt's SPO2 and respiratory rate.

## 2012-06-11 NOTE — Progress Notes (Signed)
Hypoglycemic Event  CBG: 69  Treatment: snack  Symptoms: hand tremor  Follow-up CBG: Time: 2230 CBG Result:80  Possible Reasons for Event: inadequate PO intake Comments/MD notified:    Samarie Pinder, Alessandra Bevels  Remember to initiate Hypoglycemia Order Set & complete

## 2012-06-11 NOTE — H&P (Signed)
[facility] History and Physical  Oscar Castillo UJW:[id number] DOB: [birth year] DOA: 06/11/2012   PCP: Oliver Barre, MD   Chief Complaint: Progressive shortness of breath  HPI:  61 year old woman with a history of hypertension, diabetes type 2, CHF, and hyperlipidemia presents with three-week history of shortness of breath. The patient states the shortness breath has gotten significantly worse in last 24 hours which prompted emergency department visit. The patient was noted to have pulmonary edema as well as blood pressure of 203/113. He was started on nitroglycerin drip and given furosemide 120 mg IV. The patient diuresed 500 cc 4 hours later.  Of note, the patient has a known history of CKD and has been following Dr. Kathrene Bongo, but he has been resistant to dialysis. The patient has not had any surgeries for graft or fistula access. The patient states that he has run out of his hydralazine the last 24 hours. In addition, the patient states that he has been taking 2-4 Aleeve tablets daily for the past 3 days. He denies any fevers, chills, vomiting, diarrhea. He states that he has had good oral intake. Over the past 3 weeks, he has noted increasing lower extremity edema as well as PND.  He does not weigh himself.  He states that he has been faithful with all his other antihypertensive medications. He complains of a nonproductive cough but denies any hemoptysis. He denies any dizziness, syncope, chest pain, nausea, abdominal pain, dysuria, rashes. He has not any headaches or visual changes. He denies any focal extremity weakness although he has chronic left lower extremity weakness from her previous stroke. The patient denies any previous cardiac history. He denies any history of myocardial infarction. Assessment/Plan: Hypertensive emergency resulting in fluid overload/CHF -Nitroglycerin drip will be continued as this has decreased his preload and improve the patient symptomatically -Titrate  nitro for systolic blood pressure 150-170 in the first 24 hours -Restart the patient's hydralazine -Start carvedilol 3.125 mg twice a day -Discontinue labetalol -Certainly, his other home antihypertensives can be reintroduced slowly -start low dose carvedilol and titrate up after first 24hrs. Acute systolic CHF -Echocardiogram on 08/25/2007 reveals ejection fraction 35-40% -His renal failure has precluded use of ACE inhibitor -Certainly, if the patient is declared ESRD, this may be introduced -Continue vasodilator and nitrate -Continue carvedilol -Can reintroduce his amlodipine and clonidine after the first 24 hours for aggressive blood pressure control -Wean nitroglycerin drip as blood pressure is better controlled with range of motion of his home medications -May ultimately need cardiac catheterization after the patient stabilized depending on renal function/nephrology input -Check TSH Acute on chronic renal failure -Partly due to the patient's recent use of Aleve -Nephrology has been consulted, I spoke with Dr. Arrie Aran -I ordered 500 mg of Diuril--> given in the emergency department -May need to be dialyzed if unable to diurese Diabetes mellitus type 2 -Discontinue glipizide for now -Check hemoglobin A1c -Give half his dose of home Lantus -NovoLog sliding scale -Continue aspirin Hyperlipidemia -Restart statin -lipid panel in am         Past Medical History  Diagnosis Date  . Abdominal pain, generalized 05/05/2007  . ABDOMINAL TENDERNESS 11/06/2009  . ABDOMINAL ULTRASOUND, ABNORMAL 04/23/2010  . ALLERGIC RHINITIS 05/05/2007  . BACK PAIN, LUMBAR, CHRONIC 08/06/2009  . BENIGN PROSTATIC HYPERTROPHY 08/01/2010  . CEREBROVASCULAR ACCIDENT, HX OF 08/06/2009  . CHOLELITHIASIS 08/01/2010  . CHRONIC KIDNEY DISEASE UNSPECIFIED 11/06/2009  . CONGESTIVE HEART FAILURE 03/18/2009  . DEPRESSION 03/18/2009  . DIABETES MELLITUS, TYPE II 03/25/2007  .  ERECTILE DYSFUNCTION 03/25/2007  . FLANK  PAIN, RIGHT 11/06/2009  . GERD 03/25/2007  . HEPATITIS C, HX OF 03/25/2007  . HYPERTENSION 03/25/2007  . Morbid obesity 03/25/2007  . NEPHROLITHIASIS, HX OF 03/18/2009  . RENAL INSUFFICIENCY 08/06/2009  . SHOULDER PAIN, RIGHT 09/18/2009  . THUMB PAIN, RIGHT 04/23/2010  . WEAKNESS 04/23/2010   Past Surgical History  Procedure Date  . Nephrectomy     partial RR   Social History:  reports that he has never smoked. He does not have any smokeless tobacco history on file. He reports that he does not drink alcohol or use illicit drugs.   Family History  Problem Relation Age of Onset  . Coronary artery disease Other   . Diabetes Other      No Known Allergies    Prior to Admission medications   Medication Sig Start Date End Date Taking? Authorizing Provider  amLODipine (NORVASC) 10 MG tablet Take 1 tablet (10 mg total) by mouth daily. 05/27/11  Yes Corwin Levins, MD  aspirin 81 MG EC tablet Take 81 mg by mouth daily.     Yes Historical Provider, MD  Brinzolamide-Brimonidine [facility]) 1-0.2 % SUSP Place 1 drop into the right eye 2 (two) times daily.   Yes Historical Provider, MD  cloNIDine (CATAPRES) 0.1 MG tablet Take 1 tablet (0.1 mg total) by mouth 2 (two) times daily. 04/08/12  Yes Corwin Levins, MD  furosemide (LASIX) 40 MG tablet Take 80 mg by mouth 2 (two) times daily.   Yes Historical Provider, MD  glipiZIDE (GLUCOTROL) 10 MG tablet Take 10 mg by mouth daily as needed. If sugar is high   Yes Historical Provider, MD  hydrALAZINE (APRESOLINE) 100 MG tablet Take 1 tablet (100 mg total) by mouth 3 (three) times daily. 08/07/11  Yes Corwin Levins, MD  insulin glargine (LANTUS) 100 UNIT/ML injection Inject 10-11 Units into the skin at bedtime.   Yes Historical Provider, MD  labetalol (NORMODYNE) 300 MG tablet Take 1 tablet (300 mg total) by mouth 2 (two) times daily. 06/01/11  Yes Corwin Levins, MD  pravastatin (PRAVACHOL) 20 MG tablet Take one (1) tablet(s) once daily 11/26/10  Yes Corwin Levins, MD    traMADol (ULTRAM) 50 MG tablet Take 50 mg by mouth 4 (four) times daily as needed. For pain   Yes Historical Provider, MD  Travoprost, BAK Free, (TRAVATAN) 0.004 % SOLN ophthalmic solution Place 1 drop into both eyes at bedtime.   Yes Historical Provider, MD    Review of Systems:  Constitutional:  No weight loss, night sweats, Fevers, chills, fatigue.  Head&Eyes: No headache.  No vision loss.  No eye pain or scotoma ENT:  No Difficulty swallowing,Tooth/dental problems,Sore throat,  No ear ache, post nasal drip,  Cardio-vascular:  No chest pain,   dizziness, palpitations  GI:  No  abdominal pain, nausea, vomiting, diarrhea, loss of appetite, hematochezia, melena, heartburn, indigestion, Resp:   No coughing up of blood .No wheezing.No chest wall deformity  Skin:  no rash or lesions.  GU:  no dysuria, change in color of urine, no urgency or frequency. No flank pain.  Musculoskeletal:  No joint pain. No decreased range of motion. No back pain.  Psych:  No change in mood or affect. No depression or anxiety. Neurologic: No headache, no dysesthesia, no focal weakness, no vision loss. No syncope  Physical Exam: Filed Vitals:   06/11/12 1430 06/11/12 1445 06/11/12 1515 06/11/12 1601  BP: 203/102 181/94 181/98 203/122  Pulse: 86 86 84   Temp:      TempSrc:      Resp: 19 18 16    Height:      Weight:      SpO2: 96% 97% 96%    General:  A&O x 3, NAD, nontoxic, pleasant/cooperative Head/Eye: No conjunctival hemorrhage, no icterus, [place]/AT, No nystagmus ENT:  No icterus,  No thrush, good dentition, no pharyngeal exudate Neck:  No masses, no lymphadenpathy, no bruits CV:  RRR, no rub, no gallop, no S3 Lung:  Bilateral crackles without any wheezes. Good air movement. Abdomen: soft/NT, +BS, nondistended, no peritoneal signs Ext: No cyanosis, No rashes, No petechiae, No lymphangitis, 2+ edema Neurologic: Cranial nerves II-XII grossly intact.   Labs on Admission:  Basic Metabolic  Panel:  Lab 06/11/12 0926  NA 144  K 4.5  CL 113*  CO2 --  GLUCOSE 101*  BUN 70*  CREATININE 8.70*  CALCIUM --  MG --  PHOS --   Liver Function Tests: No results found for this basename: AST:5,ALT:5,ALKPHOS:5,BILITOT:5,PROT:5,ALBUMIN:5 in the last 168 hours No results found for this basename: LIPASE:5,AMYLASE:5 in the last 168 hours No results found for this basename: AMMONIA:5 in the last 168 hours CBC:  Lab 06/11/12 0926 06/11/12 0902  WBC -- 5.8  NEUTROABS -- 3.5  HGB 10.5* 10.1*  HCT 31.0* 30.8*  MCV -- 83.7  PLT -- 277   Cardiac Enzymes: No results found for this basename: CKTOTAL:5,CKMB:5,CKMBINDEX:5,TROPONINI:5 in the last 168 hours BNP: No components found with this basename: POCBNP:5 CBG: No results found for this basename: GLUCAP:5 in the last 168 hours  Radiological Exams on Admission: Dg Chest 2 View  06/11/2012  *RADIOLOGY REPORT*  Clinical Data: Shortness of breath worsening over the past week. Right chest pressure.  CHEST - 2 VIEW  Comparison: Chest radiograph 08/23/2007  Findings: Two views of the chest demonstrate mild interstitial prominence in the lower lungs.  There is blunting at the left costophrenic angles suggesting volume loss and cannot exclude a small effusion.  Lateral view demonstrates a small area of consolidation involving either the right lower lobe or the posterior right middle lobe.  Linear density in the left mid lung suggestive for subsegmental atelectasis.  Heart size appears stable.  Trachea is midline.  IMPRESSION: Interstitial prominence and patchy densities at the lung bases.  A small area of consolidation at the right base as described. Findings could represent a combination of mild edema and volume loss.  An infectious etiology cannot be excluded, particularly at the right lung base.  Recommend follow-up to ensure resolution.   Original Report Authenticated By: Richarda Overlie, M.D.     EKG: Independently reviewed. Sinus rhythm, T wave  inversion V5, V6, prolonged QTC    Time spent:70 minutes Code Status:   full Family Communication:   wife at bedside   Nikoli Nasser, DO  [facility] Pager [phone]  If 7PM-7AM, please contact night-coverage [email or web address] Password [facility] 06/11/2012, 4:37 PM

## 2012-06-11 NOTE — ED Provider Notes (Addendum)
History     CSN: [id number]  Arrival date & time 06/11/12  4098   First MD Initiated Contact with Patient 06/11/12 [phone]      Chief Complaint  Patient presents with  . Shortness of Breath    (Consider location/radiation/quality/duration/timing/severity/associated sxs/prior treatment) HPI This 61 year old male has a history of chronic kidney disease with renal insufficiency although not on dialysis, he has diabetes and heart failure as well, he didn't take his Lasix this morning, he was unaware that he has a history of heart failure and his past medical history list, he denies any chest pain but he has been short of breath gradually worsening 24 hours a day worse with activity for the last 3 weeks. He is now short breath just resting in bed even when he is not talking. Talking and minimal movement to try to go to the bathroom causes moderately severe shift to severe shortness of breath now. He is no fever cough. He does not weigh himself at all does not know if he was waking but has had significant swelling in his lower legs with worsening than usual edema for the last few weeks gradually getting worse. He is no abdominal pain vomiting bloody stools confusion fevers or other concerns. There is no treatment prior to arrival. He is moderately short of breath just at rest without talking upon arrival although his pulse oximetry in room air is normal. He gets severely short of breath with minimal activity according to his family. Past Medical History  Diagnosis Date  . Abdominal pain, generalized 05/05/2007  . ABDOMINAL TENDERNESS 11/06/2009  . ABDOMINAL ULTRASOUND, ABNORMAL 04/23/2010  . ALLERGIC RHINITIS 05/05/2007  . BACK PAIN, LUMBAR, CHRONIC 08/06/2009  . BENIGN PROSTATIC HYPERTROPHY 08/01/2010  . CEREBROVASCULAR ACCIDENT, HX OF 08/06/2009  . CHOLELITHIASIS 08/01/2010  . CHRONIC KIDNEY DISEASE UNSPECIFIED 11/06/2009  . CONGESTIVE HEART FAILURE 03/18/2009  . DEPRESSION 03/18/2009  . DIABETES  MELLITUS, TYPE II 03/25/2007  . ERECTILE DYSFUNCTION 03/25/2007  . FLANK PAIN, RIGHT 11/06/2009  . GERD 03/25/2007  . HEPATITIS C, HX OF 03/25/2007  . HYPERTENSION 03/25/2007  . Morbid obesity 03/25/2007  . NEPHROLITHIASIS, HX OF 03/18/2009  . RENAL INSUFFICIENCY 08/06/2009  . SHOULDER PAIN, RIGHT 09/18/2009  . THUMB PAIN, RIGHT 04/23/2010  . WEAKNESS 04/23/2010    Past Surgical History  Procedure Date  . Nephrectomy     partial RR    Family History  Problem Relation Age of Onset  . Coronary artery disease Other   . Diabetes Other     History  Substance Use Topics  . Smoking status: Never Smoker   . Smokeless tobacco: Not on file  . Alcohol Use: No      Review of Systems 10 Systems reviewed and are negative for acute change except as noted in the HPI. Allergies  Review of patient's allergies indicates no known allergies.  Home Medications   No current outpatient prescriptions on file.  BP 209/113  Pulse 84  Temp 97.9 F (36.6 C) (Oral)  Resp 21  Ht 5\' 10"  (1.778 m)  Wt 254 lb 10.1 oz (115.5 kg)  BMI 36.54 kg/m2  SpO2 100%  Physical Exam  Nursing note and vitals reviewed. Constitutional:       Awake, alert, nontoxic appearance.  HENT:  Head: Atraumatic.  Eyes: Right eye exhibits no discharge. Left eye exhibits no discharge.  Neck: Neck supple.  Cardiovascular: Normal rate and regular rhythm.   No murmur heard. Pulmonary/Chest: He is in respiratory distress. He  has no wheezes. He has rales. He exhibits no tenderness.       Bilateral rales almost halfway up the posterior lung fields; moderate respiratory distress at rest with normal pulse oximetry in room air  Abdominal: Soft. There is no tenderness. There is no rebound.  Musculoskeletal: He exhibits edema. He exhibits no tenderness.       Baseline ROM, no obvious new focal weakness. Moderate edema to feet and lower legs.  Neurological:       Mental status and motor strength appears baseline for patient and  situation.  Skin: No rash noted.  Psychiatric: He has a normal mood and affect.    ED Course  Procedures (including critical care time) ECG: Sinus rhythm, ventricular rate 80, premature ventricular complexes, borderline first degree AV block, left axis deviation, prolonged QT interval, lateral inverted T waves, compared with January 2009 PR interval has increased   Patient voided urine in the ED and feels somewhat better however still somewhat hypertensive yet improved blood pressure on nitroglycerin, medicine will see him for admission. Labs Reviewed  CBC WITH DIFFERENTIAL - Abnormal; Notable for the following:    RBC 3.68 (*)     Hemoglobin 10.1 (*)     HCT 30.8 (*)     All other components within normal limits  PRO B NATRIURETIC PEPTIDE - Abnormal; Notable for the following:    Pro B Natriuretic peptide (BNP) 6937.0 (*)     All other components within normal limits  POCT I-STAT, CHEM 8 - Abnormal; Notable for the following:    Chloride 113 (*)     BUN 70 (*)     Creatinine, Ser 8.70 (*)     Glucose, Bld 101 (*)     Calcium, Ion 1.10 (*)     Hemoglobin 10.5 (*)     HCT 31.0 (*)     All other components within normal limits  POCT I-STAT TROPONIN I  CBC  IRON AND TIBC  FERRITIN  FOLATE RBC  VITAMIN B12  HEPATITIS B SURFACE ANTIGEN  HEPATITIS B SURFACE ANTIBODY  HEPATITIS B CORE ANTIBODY, IGM  COMPREHENSIVE METABOLIC PANEL  PHOSPHORUS  MRSA PCR SCREENING  TSH  MAGNESIUM  HEMOGLOBIN A1C  LIPID PANEL   Dg Chest 2 View  06/11/2012  *RADIOLOGY REPORT*  Clinical Data: Shortness of breath worsening over the past week. Right chest pressure.  CHEST - 2 VIEW  Comparison: Chest radiograph 08/23/2007  Findings: Two views of the chest demonstrate mild interstitial prominence in the lower lungs.  There is blunting at the left costophrenic angles suggesting volume loss and cannot exclude a small effusion.  Lateral view demonstrates a small area of consolidation involving either  the right lower lobe or the posterior right middle lobe.  Linear density in the left mid lung suggestive for subsegmental atelectasis.  Heart size appears stable.  Trachea is midline.  IMPRESSION: Interstitial prominence and patchy densities at the lung bases.  A small area of consolidation at the right base as described. Findings could represent a combination of mild edema and volume loss.  An infectious etiology cannot be excluded, particularly at the right lung base.  Recommend follow-up to ensure resolution.   Original Report Authenticated By: Richarda Overlie, M.D.      1. Heart failure   2. Renal failure   3. Hypertension   4. Acute on chronic renal failure   5. Congestive heart failure, unspecified   6. HTN (hypertension), malignant   7. Type II or  unspecified type diabetes mellitus without mention of complication, not stated as uncontrolled       MDM  Pt stable in ED with no significant deterioration in condition.  Patient / Family / Caregiver informed of clinical course, understand medical decision-making process, and agree with plan.  CRITICAL CARE Performed by: Hurman Horn   Total critical care time:  Critical care time was exclusive of separately billable procedures and treating other patients.  Critical care was necessary to treat or prevent imminent or life-threatening deterioration.  Critical care was time spent personally by me on the following activities: development of treatment plan with patient and/or surrogate as well as nursing, discussions with consultants, evaluation of patient's response to treatment, examination of patient, obtaining history from patient or surrogate, ordering and performing treatments and interventions, ordering and review of laboratory studies, ordering and review of radiographic studies, pulse oximetry and re-evaluation of patient's condition.      Hurman Horn, MD 06/11/12 2145  Hurman Horn, MD 06/11/12 [phone]

## 2012-06-11 NOTE — ED Notes (Signed)
Pt with a run of PVC's.  Printout shown to Dr. Fonnie Jarvis.  Will increase Nitro gtt for HTN.

## 2012-06-11 NOTE — ED Notes (Signed)
Dr. Fonnie Jarvis and phlebotomy at the bedside.

## 2012-06-11 NOTE — ED Notes (Signed)
Pt has been having SOB for about a week now but got worse last night. Also c/o ache in right chest that has been bothering him off and on. Denies any fever, cough, or body aches.

## 2012-06-12 DIAGNOSIS — R109 Unspecified abdominal pain: Secondary | ICD-10-CM

## 2012-06-12 DIAGNOSIS — Z992 Dependence on renal dialysis: Secondary | ICD-10-CM

## 2012-06-12 DIAGNOSIS — D638 Anemia in other chronic diseases classified elsewhere: Secondary | ICD-10-CM | POA: Insufficient documentation

## 2012-06-12 DIAGNOSIS — J309 Allergic rhinitis, unspecified: Secondary | ICD-10-CM

## 2012-06-12 LAB — COMPREHENSIVE METABOLIC PANEL
ALT: 18 U/L (ref 0–53)
AST: 20 U/L (ref 0–37)
Albumin: 2.5 g/dL — ABNORMAL LOW (ref 3.5–5.2)
Calcium: 8.4 mg/dL (ref 8.4–10.5)
GFR calc Af Amer: 6 mL/min — ABNORMAL LOW (ref >90)
Glucose, Bld: 54 mg/dL — ABNORMAL LOW (ref 70–99)
Potassium: 3.9 mEq/L (ref 3.5–5.1)
Sodium: 141 mEq/L (ref 135–145)
Total Protein: 6.4 g/dL (ref 6.0–8.3)

## 2012-06-12 LAB — GLUCOSE, CAPILLARY
Glucose-Capillary: 55 mg/dL — ABNORMAL LOW (ref 70–99)
Glucose-Capillary: 68 mg/dL — ABNORMAL LOW (ref 70–99)
Glucose-Capillary: 104 mg/dL — ABNORMAL HIGH (ref 70–99)
Glucose-Capillary: 90 mg/dL (ref 70–99)
Glucose-Capillary: 91 mg/dL (ref 70–99)

## 2012-06-12 LAB — HEPATITIS B SURFACE ANTIGEN: Hepatitis B Surface Ag: NEGATIVE

## 2012-06-12 LAB — CBC
MCH: 27.3 pg (ref 26.0–34.0)
MCHC: 32.9 g/dL (ref 30.0–36.0)
MCV: 83.1 fL (ref 78.0–100.0)
Platelets: 257 10*3/uL (ref 150–400)
RDW: 15 % (ref 11.5–15.5)

## 2012-06-12 LAB — HEPATITIS B SURFACE ANTIBODY,QUALITATIVE: Hep B S Ab: NEGATIVE

## 2012-06-12 LAB — LIPID PANEL
HDL: 35 mg/dL — ABNORMAL LOW (ref >39)
LDL Cholesterol: 104 mg/dL — ABNORMAL HIGH (ref 0–99)
Total CHOL/HDL Ratio: 4.4 RATIO
VLDL: 15 mg/dL (ref 0–40)

## 2012-06-12 LAB — HEMOGLOBIN A1C: Mean Plasma Glucose: 123 mg/dL — ABNORMAL HIGH (ref <117)

## 2012-06-12 MED ORDER — AMLODIPINE BESYLATE 10 MG PO TABS
10.0000 mg | ORAL_TABLET | Freq: Every day | ORAL | Status: DC
  Administered 2012-06-12 – 2012-06-20 (×9): 10 mg via ORAL
  Filled 2012-06-12 (×11): qty 1

## 2012-06-12 MED ORDER — CLONIDINE HCL 0.1 MG PO TABS
0.1000 mg | ORAL_TABLET | Freq: Three times a day (TID) | ORAL | Status: DC
  Administered 2012-06-12 – 2012-06-21 (×25): 0.1 mg via ORAL
  Filled 2012-06-12 (×30): qty 1

## 2012-06-12 MED ORDER — HYDRALAZINE HCL 20 MG/ML IJ SOLN
5.0000 mg | Freq: Four times a day (QID) | INTRAMUSCULAR | Status: DC | PRN
  Administered 2012-06-14: 5 mg via INTRAVENOUS
  Filled 2012-06-12 (×2): qty 0.25

## 2012-06-12 MED ORDER — GLUCOSE 40 % PO GEL
ORAL | Status: AC
  Administered 2012-06-12: 37.5 g
  Filled 2012-06-12: qty 1

## 2012-06-12 MED ORDER — CARVEDILOL 6.25 MG PO TABS
6.2500 mg | ORAL_TABLET | Freq: Two times a day (BID) | ORAL | Status: DC
  Administered 2012-06-12 – 2012-06-13 (×2): 6.25 mg via ORAL
  Filled 2012-06-12 (×4): qty 1

## 2012-06-12 NOTE — Progress Notes (Signed)
  Vascular Ultrasound Upper extremity vein mapping has been completed.   Farrel Demark, RDMS, RVT 06/12/2012, 10:06 AM

## 2012-06-12 NOTE — Progress Notes (Signed)
BP continues to be elevated in 170-190's systolic, 99-106 diastolic, pt has received norvasc, clonidine and hydralazine this afternoon without any changes in BP, actually BP has gone up. Message sent to Dr. Izola Price.

## 2012-06-12 NOTE — Progress Notes (Signed)
UR Completed.  Oscar Castillo Jane [phone] 06/12/2012  

## 2012-06-12 NOTE — Progress Notes (Addendum)
Patient ID: Oscar Castillo, male   DOB: 04-Sep-1950, 61 y.o.   MRN: [id number]  [facility] PROGRESS NOTE  Oscar Castillo:[id number] DOB: Jun 17, 1951 DOA: 06/11/2012 PCP: Oliver Barre, MD  Brief narrative: Pt is 61 year old male with a history of hypertension, diabetes type 2, CHF, and hyperlipidemia who presented to ED with three-week history of progressively worsening shortness of breath, associated with 3- pillow orthopnea, lower extremity swelling, weight gain of unknown amount, and non productive cough. He reported he ran out of his BP medications two days prior to admission.  In ED, pt was diagnosed with hypertensive urgency and acute pulmonary edema and was diuresed with Lasix 120 mg IV (diuresed 500 cc 4 hours later). Patient has a known history of CKD and has been following Dr. Kathrene Bongo, but he has been resistant to dialysis. The patient has not had any surgeries for graft or fistula access.   Principal Problem:  *HTN (hypertension), malignant *slight improvement since admission *home regimen includes Hydralazine, Clonidine, Norvasc, Labetalol, Lasix *per nephrology recommendations will continue same regimen with preference of Coreg over Labetalol and maximizing dose of Coreg as possible before adding new agent *continue to monitor in SDU Active Problems:  CONGESTIVE HEART FAILURE, COMBINED SYSTOLIC AND DIASTOLIC DYSFUNCTION  *last 2 D ECHO in 2009 with EF 35 - 40% *repeat 2 D ECHO indicates EF 45% and grade II diastolic dysfunction *continue aggressive diuresis as possible and continue to monitor daily weights, strict I's and O's *weight on admission 250 lbs --> 254 lbs  Acute on chronic renal failure *with diuresis creatinine has been trending up *will re evaluate in AM  *appreciate nephrology team assistance   Anemia due to chronic illness *anemia panel pending *CBC in AM  DIABETES MELLITUS, TYPE II *A1C 5.9 *continue Insulin, sliding scale only, d/c Lantus as pt had  hypoglycemia events this AM   Morbid obesity *dietary recommendations discussed with pt and wife at bedside  Hyperlipidemia *continue statin  Consultants:  Nephrology team  Procedures/Studies: Dg Chest 2 View 06/11/2012   Interstitial prominence and patchy densities at the lung bases.   A small area of consolidation at the right base as described. Findings could represent a combination of mild edema and volume loss.   An infectious etiology cannot be excluded, particularly at the right lung base.  Recommend follow-up to ensure resolution.    Antibiotics:  None  Code Status: Full Family Communication: Pt at bedside Disposition Plan: Continue to monitor in SDU  HPI/Subjective: No events overnight.   Objective: Filed Vitals:   06/12/12 1300 06/12/12 1400 06/12/12 1440 06/12/12 1500  BP: 169/103 191/106 196/106 175/97  Pulse: 84 84 83 84  Temp:      TempSrc:      Resp: 19 20 21 21   Height:      Weight:      SpO2: 100% 100% 94% 99%    Intake/Output Summary (Last 24 hours) at 06/12/12 1513 Last data filed at 06/12/12 1400  Gross per 24 hour  Intake    774 ml  Output   2300 ml  Net  -1526 ml    Exam:   General:  Pt is alert, follows commands appropriately, not in acute distress  Cardiovascular: Regular rate and rhythm, S1/S2, no murmurs, no rubs, no gallops  Respiratory: Bilateral crackles, no rhonchi, no wheezing  Abdomen: Soft, non tender, non distended, bowel sounds present, no guarding  Extremities: +3 bilateral pitting lower extremity edema, pulses DP and PT palpable bilaterally  Neuro: Grossly nonfocal  Data Reviewed: Basic Metabolic Panel:  Lab 06/12/12 1610 06/11/12 0926  NA 141 144  K 3.9 4.5  CL 107 113*  CO2 20 --  GLUCOSE 54* 101*  BUN 71* 70*  CREATININE 9.42* 8.70*  CALCIUM 8.4 --  MG 1.9 --  PHOS 4.9* --   Liver Function Tests:  Lab 06/12/12 0520  AST 20  ALT 18  ALKPHOS 70  BILITOT 0.2*  PROT 6.4  ALBUMIN 2.5*    CBC:  Lab 06/12/12 0520 06/11/12 0926 06/11/12 0902  WBC 5.7 -- 5.8  NEUTROABS -- -- 3.5  HGB 9.2* 10.5* 10.1*  HCT 28.0* 31.0* 30.8*  MCV 83.1 -- 83.7  PLT 257 -- 277   CBG:  Lab 06/12/12 1223 06/12/12 1032 06/12/12 0912 06/12/12 0807 06/11/12 2230  GLUCAP 90 104* 68* 55* 80    Recent Results (from the past 240 hour(s))  MRSA PCR SCREENING     Status: Normal   Collection Time   06/11/12  8:45 PM      Component Value Range Status Comment   MRSA by PCR NEGATIVE  NEGATIVE Final      Scheduled Meds:   . amLODipine  10 mg Oral Daily  . aspirin EC  81 mg Oral Daily  . carvedilol  3.125 mg Oral BID WC  . cloNIDine  0.1 mg Oral TID  . furosemide  160 mg Intravenous Q6H  . heparin  5,000 Units Subcutaneous Q8H  . hydrALAZINE  100 mg Oral Q8H  . insulin aspart  0-20 Units Subcutaneous TID WC  . insulin aspart  0-5 Units Subcutaneous QHS  . insulin glargine  5 Units Subcutaneous QHS  . simvastatin  10 mg Oral q1800   Continuous Infusions:  Debbora Presto, MD  [facility] Pager [phone]  If 7PM-7AM, please contact night-coverage [email or web address] Password [email or web address] 06/12/2012, 3:13 PM   LOS: 1 day

## 2012-06-12 NOTE — Progress Notes (Signed)
Patient ID: Oscar Castillo, male   DOB: 14-Mar-1951, 61 y.o.   MRN: [id number] S:pt feels better this am.   O:BP 192/95  Pulse 84  Temp 98.2 F (36.8 C) (Axillary)  Resp 32  Ht 5\' 10"  (1.778 m)  Wt 115.5 kg (254 lb 10.1 oz)  BMI 36.54 kg/m2  SpO2 94%  Intake/Output Summary (Last 24 hours) at 06/12/12 0933 Last data filed at 06/12/12 0814  Gross per 24 hour  Intake    360 ml  Output   2050 ml  Net  -1690 ml   Intake/Output: I/O last 3 completed shifts: In: 360 [P.O.:240; I.V.:54; IV Piggyback:66] Out: 1850 [Urine:1850]  Intake/Output this shift:  Total I/O In: -  Out: 200 [Urine:200] Weight change:  Gen:WD WN AAM in NAD CVS:no rub Resp:bilateral crackles WUJ:WJXBJ +BS Ext:3+ edema   Lab 06/12/12 0520 06/11/12 0926  NA 141 144  K 3.9 4.5  CL 107 113*  CO2 20 --  GLUCOSE 54* 101*  BUN 71* 70*  CREATININE 9.42* 8.70*  ALBUMIN 2.5* --  CALCIUM 8.4 --  PHOS 4.9* --  AST 20 --  ALT 18 --   Liver Function Tests:  Lab 06/12/12 0520  AST 20  ALT 18  ALKPHOS 70  BILITOT 0.2*  PROT 6.4  ALBUMIN 2.5*   No results found for this basename: LIPASE:3,AMYLASE:3 in the last 168 hours No results found for this basename: AMMONIA:3 in the last 168 hours CBC:  Lab 06/12/12 0520 06/11/12 0926 06/11/12 0902  WBC 5.7 -- 5.8  NEUTROABS -- -- 3.5  HGB 9.2* 10.5* 10.1*  HCT 28.0* 31.0* 30.8*  MCV 83.1 -- 83.7  PLT 257 -- 277   Cardiac Enzymes: No results found for this basename: CKTOTAL:5,CKMB:5,CKMBINDEX:5,TROPONINI:5 in the last 168 hours CBG:  Lab 06/12/12 0912 06/12/12 0807 06/11/12 2230 06/11/12 2226 06/11/12 2143  GLUCAP 68* 55* 80 69* 69*    Iron Studies:  Basename 06/11/12 1824  IRON 59  TIBC 194*  TRANSFERRIN --  FERRITIN 495*   Studies/Results: Dg Chest 2 View  06/11/2012  *RADIOLOGY REPORT*  Clinical Data: Shortness of breath worsening over the past week. Right chest pressure.  CHEST - 2 VIEW  Comparison: Chest radiograph 08/23/2007  Findings: Two  views of the chest demonstrate mild interstitial prominence in the lower lungs.  There is blunting at the left costophrenic angles suggesting volume loss and cannot exclude a small effusion.  Lateral view demonstrates a small area of consolidation involving either the right lower lobe or the posterior right middle lobe.  Linear density in the left mid lung suggestive for subsegmental atelectasis.  Heart size appears stable.  Trachea is midline.  IMPRESSION: Interstitial prominence and patchy densities at the lung bases.  A small area of consolidation at the right base as described. Findings could represent a combination of mild edema and volume loss.  An infectious etiology cannot be excluded, particularly at the right lung base.  Recommend follow-up to ensure resolution.   Original Report Authenticated By: Richarda Overlie, M.D.       . aspirin EC  81 mg Oral Daily  . [COMPLETED] aspirin  325 mg Oral Once  . brinzolamide  1 drop Right Eye BID   And  . brimonidine  1 drop Right Eye BID  . carvedilol  3.125 mg Oral BID WC  . [COMPLETED] chlorothiazide (DIURIL) IV  500 mg Intravenous Once  . [COMPLETED] dextrose      . [COMPLETED] furosemide  120 mg  Intravenous Once  . furosemide  160 mg Intravenous Q6H  . heparin  5,000 Units Subcutaneous Q8H  . [COMPLETED] hydrALAZINE  10 mg Intravenous Once  . hydrALAZINE  100 mg Oral Q8H  . insulin aspart  0-20 Units Subcutaneous TID WC  . insulin aspart  0-5 Units Subcutaneous QHS  . insulin glargine  5 Units Subcutaneous QHS  . [COMPLETED] nitroGLYCERIN  20 mcg/min Intravenous Once  . simvastatin  10 mg Oral q1800  . sodium chloride  3 mL Intravenous Q12H  . Travoprost (BAK Free)  1 drop Both Eyes QHS  . [DISCONTINUED] aspirin  81 mg Oral Daily  . [DISCONTINUED] aspirin EC  81 mg Oral Daily  . [DISCONTINUED] Brinzolamide-Brimonidine  1 drop Right Eye BID  . [DISCONTINUED] furosemide  120 mg Intravenous Q8H  . [DISCONTINUED] furosemide  160 mg Intravenous Q6H   . [DISCONTINUED] hydrALAZINE  100 mg Oral TID    BMET    Component Value Date/Time   NA 141 06/12/2012 0520   K 3.9 06/12/2012 0520   CL 107 06/12/2012 0520   CO2 20 06/12/2012 0520   GLUCOSE 54* 06/12/2012 0520   BUN 71* 06/12/2012 0520   CREATININE 9.42* 06/12/2012 0520   CALCIUM 8.4 06/12/2012 0520   GFRNONAA 5* 06/12/2012 0520   GFRAA 6* 06/12/2012 0520   CBC    Component Value Date/Time   WBC 5.7 06/12/2012 0520   RBC 3.37* 06/12/2012 0520   HGB 9.2* 06/12/2012 0520   HCT 28.0* 06/12/2012 0520   PLT 257 06/12/2012 0520   MCV 83.1 06/12/2012 0520   MCH 27.3 06/12/2012 0520   MCHC 32.9 06/12/2012 0520   RDW 15.0 06/12/2012 0520   LYMPHSABS 1.9 06/11/2012 0902   MONOABS 0.3 06/11/2012 0902   EOSABS 0.1 06/11/2012 0902   BASOSABS 0.0 06/11/2012 0902    Assessment/Plan:  1. Hypertensive urgency- agree with aggressive diuresis and anti-hypertensive therapy. He declines HD again at this time despite a lengthy discussion on the progression of his CKD and volume overload. He wants to hold off for now. Will resume home meds as he refuses HD.  Clonidine 0.1 tid (was on bid at home but outpt records show poorly controlled HTN), amlodipine 10mg  qd, as well as hydralazine 100 tid and labetolol 300 bid.  Agree with coreg over labetolol. 2. Progressive CKD stage 5 not yet on dialysis. Again he declines any RRT at this time despite a lengthy discussion regarding the chronicity and severity of his disease as well as his current pulmonary edema and hypertensive urgency. Will cont to educate patient and will order vascular studies and VVS consult. He would greatly benefit from initiation of HD and hopefully he will not suffer an untoward event before he accepts this therapy 3. CHF- diurese, r/o for MI and evaluate EF per primary svc 4. DM- per Primary svc 5. Anemia of chronic disease- check iron stores and consider ESA once volume/BP are under better control 6. Vascular access- as  above 7. Hep C- check LFT's 8. Hypercholesterolemia- per primary svc 9. SHPTH- cont with zemplar4mcg daily and follow Phos/ca levels 10. Denial- consider psych eval  Jawana Reagor A

## 2012-06-12 NOTE — Progress Notes (Signed)
  Echocardiogram 2D Echocardiogram has been performed.  Loa Idler [name] 06/12/2012, 12:23 PM

## 2012-06-12 NOTE — Progress Notes (Signed)
Hypoglycemic Event  CBG:55  Treatment: glucose gel  Symptoms: none  Follow-up CBG: Time:  0900 Result: 68  Possible Reasons for Event: unknown  Comments/MD notified:Dr Alcide Clever, Darell Saputo E  Remember to initiate Hypoglycemia Order Set & complete

## 2012-06-13 DIAGNOSIS — I517 Cardiomegaly: Secondary | ICD-10-CM | POA: Diagnosis present

## 2012-06-13 DIAGNOSIS — N186 End stage renal disease: Secondary | ICD-10-CM

## 2012-06-13 LAB — GLUCOSE, CAPILLARY
Glucose-Capillary: 115 mg/dL — ABNORMAL HIGH (ref 70–99)
Glucose-Capillary: 181 mg/dL — ABNORMAL HIGH (ref 70–99)
Glucose-Capillary: 184 mg/dL — ABNORMAL HIGH (ref 70–99)

## 2012-06-13 LAB — CBC
HCT: 27.3 % — ABNORMAL LOW (ref 39.0–52.0)
Hemoglobin: 8.9 g/dL — ABNORMAL LOW (ref 13.0–17.0)
RBC: 3.3 MIL/uL — ABNORMAL LOW (ref 4.22–5.81)
WBC: 6 10*3/uL (ref 4.0–10.5)

## 2012-06-13 LAB — BASIC METABOLIC PANEL
CO2: 20 mEq/L (ref 19–32)
Chloride: 101 mEq/L (ref 96–112)
Sodium: 137 mEq/L (ref 135–145)

## 2012-06-13 LAB — PHOSPHORUS: Phosphorus: 5.2 mg/dL — ABNORMAL HIGH (ref 2.3–4.6)

## 2012-06-13 MED ORDER — CARVEDILOL 12.5 MG PO TABS
12.5000 mg | ORAL_TABLET | Freq: Two times a day (BID) | ORAL | Status: DC
  Administered 2012-06-13 – 2012-06-14 (×3): 12.5 mg via ORAL
  Filled 2012-06-13 (×4): qty 1

## 2012-06-13 MED ORDER — INSULIN ASPART 100 UNIT/ML ~~LOC~~ SOLN
0.0000 [IU] | Freq: Every day | SUBCUTANEOUS | Status: DC
  Administered 2012-06-15 – 2012-06-20 (×2): 2 [IU] via SUBCUTANEOUS

## 2012-06-13 MED ORDER — NITROGLYCERIN IN D5W 200-5 MCG/ML-% IV SOLN: 2.0000 ug/min | INTRAVENOUS | Status: DC

## 2012-06-13 MED ORDER — ISOSORBIDE MONONITRATE ER 60 MG PO TB24
60.0000 mg | ORAL_TABLET | Freq: Every day | ORAL | Status: DC
  Administered 2012-06-13 – 2012-06-21 (×9): 60 mg via ORAL
  Filled 2012-06-13 (×9): qty 1

## 2012-06-13 MED ORDER — INSULIN ASPART 100 UNIT/ML ~~LOC~~ SOLN
0.0000 [IU] | Freq: Three times a day (TID) | SUBCUTANEOUS | Status: DC
  Administered 2012-06-14 – 2012-06-15 (×2): 2 [IU] via SUBCUTANEOUS
  Administered 2012-06-15 – 2012-06-16 (×3): 3 [IU] via SUBCUTANEOUS
  Administered 2012-06-17: 5 [IU] via SUBCUTANEOUS
  Administered 2012-06-17: 2 [IU] via SUBCUTANEOUS
  Administered 2012-06-17: 3 [IU] via SUBCUTANEOUS
  Administered 2012-06-18: 2 [IU] via SUBCUTANEOUS
  Administered 2012-06-18: 3 [IU] via SUBCUTANEOUS
  Administered 2012-06-19 – 2012-06-20 (×3): 5 [IU] via SUBCUTANEOUS
  Administered 2012-06-20: 3 [IU] via SUBCUTANEOUS
  Administered 2012-06-21: 8 [IU] via SUBCUTANEOUS
  Administered 2012-06-21: 3 [IU] via SUBCUTANEOUS

## 2012-06-13 NOTE — Progress Notes (Signed)
[facility] Progress Note [place] TEAM 1 - Stepdown/ICU TEAM   FARAH BENISH ZOX:[id number] DOB: January 24, 1951 DOA: 06/11/2012 PCP: Oliver Barre, MD  Brief narrative: 61 year old male patient with long-standing hypertension and chronic kidney disease and diabetes. Presented to the emergency department with a three-week history of progressively worsening shortness of breath associated with 3 pillow orthopnea, lower extrem swelling, unknown amount of weight gain, and a nonproductive cough. He reported that he had run out of his normal blood pressure medications 2 days prior to admission. In the emergency department patient was found to be experiencing hypertensive urgency with an associated acute pulmonary edema exacerbated by the patient's chronic kidney disease. He was given Lasix 120 mg IV and diuresed about 500 cc an hour later. He was subsequently admitted to the step down unit for further monitoring and treatment  Assessment/Plan:   *HTN (hypertension), malignant *Blood pressure control remains somewhat suboptimal and suspect is being influenced by severity of chronic kidney disease and inability to adequately diurese fluid/systolic *Discontinue IV nitroglycerin in favor of Imdur *Continue maximum dose Norvasc *Increase carvedilol dose *Continue Catapres *Continue maximum dose hydralazine   Acute combined systolic and diastolic heart failure, NYHA class 2-EF 45% *On high-dose Lasix 160 mg IV every 6 hours *-4300 cc of volume since admission *Suspect optimal diuresis will be obtained with dialysis-see below *Last chest x-ray completed 06/11/2012 revealed interstitial prominence and patchy density in the lung bases consistent with edema   CKD (chronic kidney disease) stage 5, GFR less than 15 ml/min *Appreciate nephrology assistance *Patient remains resistant on starting dialysis at this time but is agreeable to have permanent access placed this admission *Nephrology has consulted  vascular surgery team   DIABETES MELLITUS, TYPE II *Current CBGs remain well controlled *Hemoglobin A1c is acceptable at 5.9 *Continue sliding scale insulin   Anemia due to chronic illness (CKD) *Baseline hemoglobin in 2012 was 12.5 and currently has drifted down to about 8.9 to 9.2 *Consider initiation of Procrit - defer to Nephrology   LVH (left ventricular hypertrophy)-severe concentric *Evidence of uncontrolled hypertension and is likely precipitating factor in patient's systolic as well as diastolic heart failure   HEPATITIS C, HX OF   CEREBROVASCULAR ACCIDENT, HX OF    Hyperlipidemia *Continue simvastatin   DVT prophylaxis: SCDs Code Status: Full Family Communication: Spoke with patient Disposition Plan: Transfer to telemetry once IV nitroglycerin weaned off  Consultants: Nephrology VVS  Procedures: None  Antibiotics: None  HPI/Subjective: Patient alert without specific complaints. Denies shortness of breath or chest pain. Did discuss importance of considering dialysis treatment to effectively treat his hypertension and heart failure. At this time patient is only agreeable to pursuing dialysis access for future use.   Objective: Blood pressure 160/85, pulse 82, temperature 98.2 F (36.8 C), temperature source Oral, resp. rate 21, height 5\' 10"  (1.778 m), weight 116.2 kg (256 lb 2.8 oz), SpO2 100.00%.  Intake/Output Summary (Last 24 hours) at 06/13/12 1326 Last data filed at 06/13/12 1206  Gross per 24 hour  Intake    908 ml  Output   3675 ml  Net  -2767 ml     Exam: General: No acute respiratory distress Lungs: Clear to auscultation except for crackles bilaterally in the bases without wheezes, no further BiPAP requirements and is tolerating room air Cardiovascular: Regular rate and rhythm without murmur gallop or rub normal S1 and S2, 2+ bilateral peripheral edema Abdomen: Nontender, nondistended, soft, bowel sounds positive, no rebound, no ascites, no  appreciable mass Musculoskeletal:  No significant cyanosis, clubbing of bilateral lower extremities Neurological: Alert and oriented x3, moves all extremities x4, exam non-focal  Data Reviewed: Basic Metabolic Panel:  Lab 06/13/12 4098 06/12/12 0520 06/11/12 0926  NA 137 141 144  K 4.3 3.9 4.5  CL 101 107 113*  CO2 20 20 --  GLUCOSE 106* 54* 101*  BUN 76* 71* 70*  CREATININE 9.79* 9.42* 8.70*  CALCIUM 7.9* 8.4 --  MG -- 1.9 --  PHOS 5.2* 4.9* --   Liver Function Tests:  Lab 06/12/12 0520  AST 20  ALT 18  ALKPHOS 70  BILITOT 0.2*  PROT 6.4  ALBUMIN 2.5*   CBC:  Lab 06/13/12 0524 06/12/12 0520 06/11/12 0926 06/11/12 0902  WBC 6.0 5.7 -- 5.8  NEUTROABS -- -- -- 3.5  HGB 8.9* 9.2* 10.5* 10.1*  HCT 27.3* 28.0* 31.0* 30.8*  MCV 82.7 83.1 -- 83.7  PLT 251 257 -- 277   BNP (last 3 results)  Basename 06/11/12 0916  PROBNP 6937.0*   CBG:  Lab 06/13/12 0819 06/12/12 2116 06/12/12 1656 06/12/12 1223 06/12/12 1032  GLUCAP 115* 91 77 90 104*    Recent Results (from the past 240 hour(s))  MRSA PCR SCREENING     Status: Normal   Collection Time   06/11/12  8:45 PM      Component Value Range Status Comment   MRSA by PCR NEGATIVE  NEGATIVE Final      Studies:  Recent x-ray studies have been reviewed in detail by the Attending Physician  Scheduled Meds:  Reviewed in detail by the Attending Physician   Junious Silk, ANP [facility] Office  [phone] Pager [phone]  On-Call/Text Page:      [email or web address]      password [email or web address]  If 7PM-7AM, please contact night-coverage [email or web address] Password [email or web address] 06/13/2012, 1:26 PM   LOS: 2 days   I have personally examined this patient and reviewed the entire database. I have reviewed the above note, made any necessary editorial changes, and agree with its content.  Lonia Blood, MD [facility]

## 2012-06-13 NOTE — Progress Notes (Signed)
Patient ID: Oscar Castillo, male   DOB: 1951/07/21, 61 y.o.   MRN: [id number]   [facility] Progress Note    Subjective:   Reports to be feeling better with improvement in shortness of breath- diuresis noted overnight and net negative balance.   Objective:   BP 167/109  Pulse 82  Temp 98.2 F (36.8 C) (Oral)  Resp 21  Ht 5\' 10"  (1.778 m)  Wt 116.2 kg (256 lb 2.8 oz)  BMI 36.76 kg/m2  SpO2 100%  Intake/Output Summary (Last 24 hours) at 06/13/12 0904 Last data filed at 06/13/12 0817  Gross per 24 hour  Intake   1178 ml  Output   3425 ml  Net  -2247 ml   Weight change: 2.801 kg (6 lb 2.8 oz)  Physical Exam: WUJ:[id number] sitting up in bed AOZ:HYQMV RRR, normal S1 and S2  Resp:Decreased BS over bases with occasional fine rales. No rhonchi HQI:ONGE, obese, NT, BS normal Ext:2+ LE edema pretibially  Imaging: Dg Chest 2 View  06/11/2012  *RADIOLOGY REPORT*  Clinical Data: Shortness of breath worsening over the past week. Right chest pressure.  CHEST - 2 VIEW  Comparison: Chest radiograph 08/23/2007  Findings: Two views of the chest demonstrate mild interstitial prominence in the lower lungs.  There is blunting at the left costophrenic angles suggesting volume loss and cannot exclude a small effusion.  Lateral view demonstrates a small area of consolidation involving either the right lower lobe or the posterior right middle lobe.  Linear density in the left mid lung suggestive for subsegmental atelectasis.  Heart size appears stable.  Trachea is midline.  IMPRESSION: Interstitial prominence and patchy densities at the lung bases.  A small area of consolidation at the right base as described. Findings could represent a combination of mild edema and volume loss.  An infectious etiology cannot be excluded, particularly at the right lung base.  Recommend follow-up to ensure resolution.   Original Report Authenticated By: Richarda Overlie, M.D.     Labs: BMET  Lab 06/13/12  0524 06/12/12 0520 06/11/12 0926  NA 137 141 144  K 4.3 3.9 4.5  CL 101 107 113*  CO2 20 20 --  GLUCOSE 106* 54* 101*  BUN 76* 71* 70*  CREATININE 9.79* 9.42* 8.70*  ALB -- -- --  CALCIUM 7.9* 8.4 --  PHOS 5.2* 4.9* --   CBC  Lab 06/13/12 0524 06/12/12 0520 06/11/12 0926 06/11/12 0902  WBC 6.0 5.7 -- 5.8  NEUTROABS -- -- -- 3.5  HGB 8.9* 9.2* 10.5* 10.1*  HCT 27.3* 28.0* 31.0* 30.8*  MCV 82.7 83.1 -- 83.7  PLT 251 257 -- 277    Medications:      . amLODipine  10 mg Oral Daily  . aspirin EC  81 mg Oral Daily  . brinzolamide  1 drop Right Eye BID   And  . brimonidine  1 drop Right Eye BID  . carvedilol  6.25 mg Oral BID WC  . cloNIDine  0.1 mg Oral TID  . furosemide  160 mg Intravenous Q6H  . heparin  5,000 Units Subcutaneous Q8H  . hydrALAZINE  100 mg Oral Q8H  . insulin aspart  0-20 Units Subcutaneous TID WC  . insulin aspart  0-5 Units Subcutaneous QHS  . simvastatin  10 mg Oral q1800  . sodium chloride  3 mL Intravenous Q12H  . Travoprost (BAK Free)  1 drop Both Eyes QHS  . [DISCONTINUED] carvedilol  3.125 mg Oral BID WC  . [  DISCONTINUED] insulin glargine  5 Units Subcutaneous QHS     Assessment/ Plan:   1. Hypertensive urgency- Continue with aggressive diuresis and anti-hypertensive therapy. He is hesitant to start HD again at this time despite a lengthy discussion on the progression of his CKD and volume overload. He is agreeable with placement of permanent access. Anti-HTN therapy revisited.  2. Progressive CKD stage 5 not yet on dialysis. Again he declines RRT at this time despite a lengthy discussion regarding the chronicity and severity of his disease as well as his current pulmonary edema and hypertensive urgency. Will await VVS consult. He would greatly benefit from initiation of HD and hopefully he will not suffer an untoward event before he accepts this therapy  3. CHF- diurese, r/o for MI and Echo shows 45% EF with concentric LVH c/w HTN  4. DM- per  Primary svc  5. Anemia of chronic disease- check iron stores and consider ESA once volume/BP are under better control 6. Hep C- check LFT's  7. SHPTH- cont with zemplar88mcg daily and follow Phos/ca levels   Zetta Bills, MD 06/13/2012, 9:04 AM

## 2012-06-13 NOTE — Progress Notes (Signed)
Inpatient Diabetes Program Recommendations  AACE/ADA: New Consensus Statement on Inpatient Glycemic Control (2013)  Target Ranges:  Prepandial:   less than 140 mg/dL      Peak postprandial:   less than 180 mg/dL (1-2 hours)      Critically ill patients:  140 - 180 mg/dL   Reason for Visit: Recommendation regarding correction scale.   Inpatient Diabetes Program Recommendations Correction (SSI): Current correction scale is resistant scale tid and HS scale coverage.  Given renal status, request MD change correction scale to sensitive tid with HS scale coverage.  Note: Patient has had hypoglycemia even without receiving any correction scale.  Thank you.  Tipton Ballow S. Elsie Lincoln, RN, CNS, CDE Inpatient Diabetes Program, team pager [phone]

## 2012-06-13 NOTE — Progress Notes (Signed)
Nitro glycerin weaned to off. Denies pain.

## 2012-06-13 NOTE — Consult Note (Signed)
[facility] [place] CONSULT NOTE 06/13/2012 DOB: [id number] MRN : [id number]  CC: ESRD; not yet on HD Referring Physician: Vonna Kotyk Patel,MD  History of Present Illness: Oscar Castillo is a 61 y.o. male with long-standing hypertension and chronic kidney disease and diabetes. Presented to the emergency department with a three-week history of progressively worsening shortness of breath associated with 3 pillow orthopnea, lower sternum the swelling, unknown amount of weight gain, and a nonproductive cough. Pt was last seen by Dr. Kathrene Bongo on 04/29/12 and again discussed RRT and vascular access with him at that time. He again declined anything. His creatinine is 9.79 mg/dL. Pt now agrees to HD and we were asked to consult for HD access. He has had vein mapping. Pt is RHD.   Past Medical History  Diagnosis Date  . Abdominal pain, generalized 05/05/2007  . ABDOMINAL TENDERNESS 11/06/2009  . ABDOMINAL ULTRASOUND, ABNORMAL 04/23/2010  . ALLERGIC RHINITIS 05/05/2007  . BACK PAIN, LUMBAR, CHRONIC 08/06/2009  . BENIGN PROSTATIC HYPERTROPHY 08/01/2010  . CEREBROVASCULAR ACCIDENT, HX OF 08/06/2009  . CHOLELITHIASIS 08/01/2010  . CHRONIC KIDNEY DISEASE UNSPECIFIED 11/06/2009  . CONGESTIVE HEART FAILURE 03/18/2009  . DEPRESSION 03/18/2009  . DIABETES MELLITUS, TYPE II 03/25/2007  . ERECTILE DYSFUNCTION 03/25/2007  . FLANK PAIN, RIGHT 11/06/2009  . GERD 03/25/2007  . HEPATITIS C, HX OF 03/25/2007  . HYPERTENSION 03/25/2007  . Morbid obesity 03/25/2007  . NEPHROLITHIASIS, HX OF 03/18/2009  . RENAL INSUFFICIENCY 08/06/2009  . SHOULDER PAIN, RIGHT 09/18/2009  . THUMB PAIN, RIGHT 04/23/2010  . WEAKNESS 04/23/2010    Past Surgical History  Procedure Date  . Nephrectomy     partial RR     ROS: [x]  Positive  [ ]  Denies    General: [ ]  Weight loss, [ ]  Fever, [ ]  chills Neurologic: [ ]  Dizziness, [ ]  Blackouts, [ ]  Seizure [ ]  Stroke, [ ]  "Mini stroke", [ ]  Slurred speech, [ ]  Temporary blindness; [ ]   weakness in arms or legs, [ ]  Hoarseness Cardiac: [ ]  Chest pain/pressure, [x ] Shortness of breath at rest [ x] Shortness of breath with exertion, [ ]  Atrial fibrillation or irregular heartbeat Vascular: [ ]  Pain in legs with walking, [ ]  Pain in legs at rest, [ ]  Pain in legs at night,  [ ]  Non-healing ulcer, [ ]  Blood clot in vein/DVT,   Pulmonary: [ ]  Home oxygen, [ ]  Productive cough, [ ]  Coughing up blood, [ ]  Asthma,  [ ]  Wheezing Musculoskeletal:  [ x] Arthritis, [x ] Low back pain, [ ]  Joint pain Hematologic: [ ]  Easy Bruising, [ ]  Anemia; [ x] Hx Hepatitis C Gastrointestinal: [ ]  Blood in stool, [ ]  Gastroesophageal Reflux/heartburn, [ ]  Trouble swallowing Urinary: [x ] chronic Kidney disease, [ ]  on HD - [ ]  MWF or [ ]  TTHS, [ ]  Burning with urination, [ ]  Difficulty urinating Skin: [ ]  Rashes, [ ]  Wounds Psychological: [ ]  Anxiety, [ ]  Depression  Social History History  Substance Use Topics  . Smoking status: Never Smoker   . Smokeless tobacco: Not on file  . Alcohol Use: No    Family History Family History  Problem Relation Age of Onset  . Coronary artery disease Other   . Diabetes Other     No Known Allergies  Current Facility-Administered Medications  Medication Dose Route Frequency Provider Last Rate Last Dose  . 0.9 %  sodium chloride infusion  250 mL Intravenous PRN Catarina Hartshorn, MD      .  acetaminophen (TYLENOL) tablet 650 mg  650 mg Oral Q4H PRN Catarina Hartshorn, MD      . amLODipine (NORVASC) tablet 10 mg  10 mg Oral Daily Irena Cords, MD   10 mg at 06/13/12 1004  . aspirin EC tablet 81 mg  81 mg Oral Daily Kendra P Hiatt, PHARMD   81 mg at 06/13/12 1004  . brinzolamide (AZOPT) 1 % ophthalmic suspension 1 drop  1 drop Right Eye BID Marlane Mingle Hiatt, PHARMD   1 drop at 06/13/12 1007   And  . brimonidine (ALPHAGAN) 0.2 % ophthalmic solution 1 drop  1 drop Right Eye BID Marlane Mingle Hiatt, PHARMD   1 drop at 06/13/12 1006  . carvedilol (COREG) tablet 12.5 mg  12.5 mg  Oral BID WC Lonia Blood, MD      . cloNIDine (CATAPRES) tablet 0.1 mg  0.1 mg Oral TID Irena Cords, MD   0.1 mg at 06/13/12 1004  . furosemide (LASIX) 160 mg in dextrose 5 % 50 mL IVPB  160 mg Intravenous Q6H Irena Cords, MD   160 mg at 06/13/12 1009  . heparin injection 5,000 Units  5,000 Units Subcutaneous Q8H Catarina Hartshorn, MD   5,000 Units at 06/13/12 [phone]  . hydrALAZINE (APRESOLINE) injection 5 mg  5 mg Intravenous Q6H PRN Dorothea Ogle, MD      . hydrALAZINE (APRESOLINE) tablet 100 mg  100 mg Oral Q8H Kendra P Hiatt, PHARMD   100 mg at 06/13/12 9604  . insulin aspart (novoLOG) injection 0-20 Units  0-20 Units Subcutaneous TID WC Catarina Hartshorn, MD   4 Units at 06/13/12 1305  . insulin aspart (novoLOG) injection 0-5 Units  0-5 Units Subcutaneous QHS Catarina Hartshorn, MD      . isosorbide mononitrate (IMDUR) 24 hr tablet 60 mg  60 mg Oral Daily Lonia Blood, MD   60 mg at 06/13/12 1204  . nitroGLYCERIN (NITROSTAT) SL tablet 0.4 mg  0.4 mg Sublingual Q5 min PRN Hurman Horn, MD      . ondansetron [facility]) injection 4 mg  4 mg Intravenous Q6H PRN Catarina Hartshorn, MD      . simvastatin (ZOCOR) tablet 10 mg  10 mg Oral q1800 Catarina Hartshorn, MD   10 mg at 06/12/12 1724  . sodium chloride 0.9 % injection 3 mL  3 mL Intravenous Q12H Catarina Hartshorn, MD   3 mL at 06/12/12 2246  . sodium chloride 0.9 % injection 3 mL  3 mL Intravenous PRN Catarina Hartshorn, MD      . traMADol Janean Sark) tablet 50 mg  50 mg Oral QID PRN Catarina Hartshorn, MD      . Travoprost (BAK Free) (TRAVATAN) 0.004 % ophthalmic solution SOLN 1 drop  1 drop Both Eyes QHS Catarina Hartshorn, MD   1 drop at 06/12/12 2246  . [DISCONTINUED] carvedilol (COREG) tablet 3.125 mg  3.125 mg Oral BID WC Catarina Hartshorn, MD   3.125 mg at 06/12/12 0925  . [DISCONTINUED] carvedilol (COREG) tablet 6.25 mg  6.25 mg Oral BID WC Dorothea Ogle, MD   6.25 mg at 06/13/12 0634  . [DISCONTINUED] insulin glargine (LANTUS) injection 5 Units  5 Units Subcutaneous QHS Catarina Hartshorn, MD      .  [DISCONTINUED] nitroGLYCERIN 0.2 mg/mL in dextrose 5 % infusion  2-200 mcg/min Intravenous Titrated Catarina Hartshorn, MD 9 mL/hr at 06/12/12 1518 30 mcg/min at 06/12/12 1518  . [DISCONTINUED] nitroGLYCERIN 0.2 mg/mL in dextrose 5 % infusion  2-200 mcg/min Intravenous Titrated Lonia Blood, MD 3 mL/hr at 06/13/12 1206 10 mcg/min at 06/13/12 1206     Imaging: No results found.  Significant Diagnostic Studies: CBC Lab Results  Component Value Date   WBC 6.0 06/13/2012   HGB 8.9* 06/13/2012   HCT 27.3* 06/13/2012   MCV 82.7 06/13/2012   PLT 251 06/13/2012    BMET    Component Value Date/Time   NA 137 06/13/2012 0524   K 4.3 06/13/2012 0524   CL 101 06/13/2012 0524   CO2 20 06/13/2012 0524   GLUCOSE 106* 06/13/2012 0524   BUN 76* 06/13/2012 0524   CREATININE 9.79* 06/13/2012 0524   CALCIUM 7.9* 06/13/2012 0524   GFRNONAA 5* 06/13/2012 0524   GFRAA 6* 06/13/2012 0524    COAG Lab Results  Component Value Date   INR 1.0 08/23/2007   No results found for this basename: PTT     Physical Examination BP Readings from Last 3 Encounters:  06/13/12 171/100  11/04/11 106/62  08/07/11 134/86   Temp Readings from Last 3 Encounters:  06/13/12 98.2 F (36.8 C) Oral  11/04/11 98.1 F (36.7 C) Oral  08/07/11 98.6 F (37 C) Oral   SpO2 Readings from Last 3 Encounters:  06/13/12 100%  11/04/11 97%  08/07/11 97%   Pulse Readings from Last 3 Encounters:  06/13/12 80  11/04/11 80  08/07/11 76    General:  WDWN in NAD Gait: Normal HENT: WNL Eyes: Pupils equal Pulmonary: normal non-labored breathing , without Rales, rhonchi,  wheezing Cardiac: RRR, without  Murmurs, rubs or gallops; No carotid bruits Abdomen: soft, NT, no masses Skin: no rashes, ulcers noted Vascular Exam/Pulses: 2+ radial pulses bilat Both hands are warm Extremities without ischemic changes, no Gangrene , no cellulitis; no open wounds;  Musculoskeletal: no muscle wasting or atrophy  Neurologic: A&O X  3; Appropriate Affect ;  SENSATION: normal; MOTOR FUNCTION: Pt has good and equal strength in all extremities - 5/5 Speech is fluent/normal  Non-Invasive Vascular Imaging: UE Vein mapping shows adequate left UE Cephalic and Basilic vein in the upper arm. Pt may have adequate cephalic vein at wrist but has an IV in this.   ASSESSMENT/PLAN: Save LUE for Brachiocephalic AVF vs BVT  - possibility of radiocephalic AVF on left once IV removed - we can assess at time of surgery Will check with our office to schedule surgery  I have examined the patient, reviewed and agree with above. Discussed options with the patient to include acute hemodialysis catheter, AV graft and AV fistula. His vein map indicates that he is a candidate for left AV fistula creation. He understands this may not mature and may require additional treatment for access. He is scheduled for surgery tomorrow with Dr. Edilia Bo.  Ilaria Much, MD 06/13/2012 3:12 PM

## 2012-06-13 NOTE — Care Management Note (Signed)
    Page 1 of 1   06/13/2012     9:55:53 AM   CARE MANAGEMENT NOTE 06/13/2012  Patient:  Oscar Castillo, Oscar Castillo   Account Number:  [id number]  Date Initiated:  06/13/2012  Documentation initiated by:  Alvira Philips Assessment:   61 yr-old male adm with HTN emergency; lives with spouse, independent PTA     DC Planning Services  CM consult  Homebound not met per provider      [facility] arranged  HH - 11 Patient Refused      Per UR Regulation:  Reviewed for med. necessity/level of care/duration of stay  Comments:  PCP: Dr Oliver Barre  06/13/12 0948 Oscar Trulson RN BSN MSN CCM Pt states he ran out of hydralazine, was going to get Rx refilled the day he was admitted - is aware he needs to take meds daily as prescribed.  Has insurance and cost of medication was not an issue, "just hadn't gotten around to it."  Discussed home health services, pt states he does not plan to be at home, drives and is quite active.

## 2012-06-14 ENCOUNTER — Encounter (HOSPITAL_COMMUNITY): Payer: Self-pay | Admitting: Anesthesiology

## 2012-06-14 ENCOUNTER — Encounter (HOSPITAL_COMMUNITY)
Admission: EM | Disposition: A | Discharge status: Home / Self Care | Payer: Self-pay | Source: Home / Self Care | Attending: Internal Medicine

## 2012-06-14 ENCOUNTER — Inpatient Hospital Stay (HOSPITAL_COMMUNITY): Payer: Medicare Other | Admitting: Anesthesiology

## 2012-06-14 DIAGNOSIS — I5041 Acute combined systolic (congestive) and diastolic (congestive) heart failure: Secondary | ICD-10-CM

## 2012-06-14 DIAGNOSIS — D638 Anemia in other chronic diseases classified elsewhere: Secondary | ICD-10-CM

## 2012-06-14 HISTORY — PX: AV FISTULA PLACEMENT: SHX1204

## 2012-06-14 LAB — BASIC METABOLIC PANEL
BUN: 86 mg/dL — ABNORMAL HIGH (ref 6–23)
GFR calc Af Amer: 6 mL/min — ABNORMAL LOW (ref >90)
GFR calc non Af Amer: 5 mL/min — ABNORMAL LOW (ref >90)
Potassium: 4.1 mEq/L (ref 3.5–5.1)
Sodium: 135 mEq/L (ref 135–145)

## 2012-06-14 LAB — GLUCOSE, CAPILLARY
Glucose-Capillary: 136 mg/dL — ABNORMAL HIGH (ref 70–99)
Glucose-Capillary: 177 mg/dL — ABNORMAL HIGH (ref 70–99)

## 2012-06-14 LAB — SURGICAL PCR SCREEN: Staphylococcus aureus: NEGATIVE

## 2012-06-14 SURGERY — ARTERIOVENOUS (AV) FISTULA CREATION
Anesthesia: Monitor Anesthesia Care | Site: Arm Lower | Laterality: Left | Wound class: Clean

## 2012-06-14 MED ORDER — SODIUM CHLORIDE 0.9 % IR SOLN
Status: DC | PRN
  Administered 2012-06-14: 11:00:00

## 2012-06-14 MED ORDER — LIDOCAINE-EPINEPHRINE (PF) 1 %-1:200000 IJ SOLN
INTRAMUSCULAR | Status: DC | PRN
  Administered 2012-06-14: 15 mL

## 2012-06-14 MED ORDER — MIDAZOLAM HCL 5 MG/5ML IJ SOLN
INTRAMUSCULAR | Status: DC | PRN
  Administered 2012-06-14: 2 mg via INTRAVENOUS

## 2012-06-14 MED ORDER — THROMBIN 20000 UNITS EX SOLR
CUTANEOUS | Status: AC
  Filled 2012-06-14: qty 20000

## 2012-06-14 MED ORDER — HEPARIN SODIUM (PORCINE) 1000 UNIT/ML IJ SOLN
INTRAMUSCULAR | Status: DC | PRN
  Administered 2012-06-14: 8000 [IU] via INTRAVENOUS

## 2012-06-14 MED ORDER — SODIUM CHLORIDE 0.9 % IV SOLN
INTRAVENOUS | Status: DC | PRN
  Administered 2012-06-14: 10:00:00 via INTRAVENOUS

## 2012-06-14 MED ORDER — OXYCODONE HCL 5 MG/5ML PO SOLN: 5.0000 mg | Freq: Once | ORAL | Status: AC | PRN

## 2012-06-14 MED ORDER — OXYCODONE HCL 5 MG PO TABS: 5.0000 mg | ORAL_TABLET | Freq: Once | ORAL | Status: AC | PRN

## 2012-06-14 MED ORDER — LIDOCAINE HCL (PF) 1 % IJ SOLN
INTRAMUSCULAR | Status: AC
  Filled 2012-06-14: qty 30

## 2012-06-14 MED ORDER — FENTANYL CITRATE 0.05 MG/ML IJ SOLN
25.0000 ug | INTRAMUSCULAR | Status: DC | PRN
  Administered 2012-06-14 (×2): 50 ug via INTRAVENOUS

## 2012-06-14 MED ORDER — CARVEDILOL 25 MG PO TABS
25.0000 mg | ORAL_TABLET | Freq: Two times a day (BID) | ORAL | Status: DC
  Administered 2012-06-15 – 2012-06-21 (×12): 25 mg via ORAL
  Filled 2012-06-14 (×15): qty 1

## 2012-06-14 MED ORDER — CEFAZOLIN SODIUM-DEXTROSE 2-3 GM-% IV SOLR
INTRAVENOUS | Status: AC
  Filled 2012-06-14: qty 50

## 2012-06-14 MED ORDER — METOCLOPRAMIDE HCL 5 MG/ML IJ SOLN
10.0000 mg | Freq: Once | INTRAMUSCULAR | Status: AC | PRN
  Filled 2012-06-14: qty 2

## 2012-06-14 MED ORDER — LIDOCAINE HCL (PF) 1 % IJ SOLN
INTRAMUSCULAR | Status: DC | PRN
  Administered 2012-06-14: 30 mL

## 2012-06-14 MED ORDER — 0.9 % SODIUM CHLORIDE (POUR BTL) OPTIME
TOPICAL | Status: DC | PRN
  Administered 2012-06-14: 1000 mL

## 2012-06-14 MED ORDER — FENTANYL CITRATE 0.05 MG/ML IJ SOLN
INTRAMUSCULAR | Status: DC | PRN
  Administered 2012-06-14 (×3): 25 ug via INTRAVENOUS

## 2012-06-14 MED ORDER — OXYCODONE-ACETAMINOPHEN 5-325 MG PO TABS
1.0000 | ORAL_TABLET | ORAL | Status: DC | PRN
  Administered 2012-06-16 – 2012-06-18 (×6): 2 via ORAL
  Filled 2012-06-14 (×6): qty 2

## 2012-06-14 MED ORDER — PROPOFOL INFUSION 10 MG/ML OPTIME
INTRAVENOUS | Status: DC | PRN
  Administered 2012-06-14: 75 ug/kg/min via INTRAVENOUS

## 2012-06-14 MED ORDER — FENTANYL CITRATE 0.05 MG/ML IJ SOLN
INTRAMUSCULAR | Status: AC
  Filled 2012-06-14: qty 2

## 2012-06-14 MED ORDER — LIDOCAINE-EPINEPHRINE (PF) 1 %-1:200000 IJ SOLN
INTRAMUSCULAR | Status: AC
  Filled 2012-06-14: qty 10

## 2012-06-14 MED ORDER — ATORVASTATIN CALCIUM 40 MG PO TABS
40.0000 mg | ORAL_TABLET | Freq: Every day | ORAL | Status: DC
  Administered 2012-06-15 – 2012-06-21 (×7): 40 mg via ORAL
  Filled 2012-06-14 (×7): qty 1

## 2012-06-14 MED ORDER — PROTAMINE SULFATE 10 MG/ML IV SOLN
INTRAVENOUS | Status: DC | PRN
  Administered 2012-06-14 (×2): 25 mg via INTRAVENOUS

## 2012-06-14 MED ORDER — CEFAZOLIN SODIUM-DEXTROSE 2-3 GM-% IV SOLR
INTRAVENOUS | Status: DC | PRN
  Administered 2012-06-14: 2 g via INTRAVENOUS

## 2012-06-14 SURGICAL SUPPLY — 41 items
ADH SKN CLS APL DERMABOND .7 (GAUZE/BANDAGES/DRESSINGS) ×1
CANISTER SUCTION 2500CC (MISCELLANEOUS) ×2 IMPLANT
CLIP TI MEDIUM 6 (CLIP) ×2 IMPLANT
CLIP TI WIDE RED SMALL 6 (CLIP) ×2 IMPLANT
CLOTH BEACON ORANGE TIMEOUT ST (SAFETY) ×2 IMPLANT
COVER PROBE W GEL 5X96 (DRAPES) ×2 IMPLANT
COVER SURGICAL LIGHT HANDLE (MISCELLANEOUS) ×2 IMPLANT
DECANTER SPIKE VIAL GLASS SM (MISCELLANEOUS) ×2 IMPLANT
DERMABOND ADVANCED (GAUZE/BANDAGES/DRESSINGS) ×1
DERMABOND ADVANCED .7 DNX12 (GAUZE/BANDAGES/DRESSINGS) ×1 IMPLANT
DRAIN PENROSE 1/2X12 LTX STRL (WOUND CARE) IMPLANT
ELECT REM PT RETURN 9FT ADLT (ELECTROSURGICAL) ×2
ELECTRODE REM PT RTRN 9FT ADLT (ELECTROSURGICAL) ×1 IMPLANT
GAUZE SPONGE 4X4 16PLY XRAY LF (GAUZE/BANDAGES/DRESSINGS) ×1 IMPLANT
GLOVE BIO SURGEON STRL SZ 6.5 (GLOVE) ×3 IMPLANT
GLOVE BIO SURGEON STRL SZ7.5 (GLOVE) ×2 IMPLANT
GLOVE BIOGEL PI IND STRL 6.5 (GLOVE) IMPLANT
GLOVE BIOGEL PI IND STRL 7.0 (GLOVE) IMPLANT
GLOVE BIOGEL PI IND STRL 8 (GLOVE) ×1 IMPLANT
GLOVE BIOGEL PI INDICATOR 6.5 (GLOVE) ×2
GLOVE BIOGEL PI INDICATOR 7.0 (GLOVE) ×1
GLOVE BIOGEL PI INDICATOR 8 (GLOVE) ×2
GLOVE ECLIPSE 6.5 STRL STRAW (GLOVE) ×1 IMPLANT
GOWN PREVENTION PLUS XXLARGE (GOWN DISPOSABLE) ×1 IMPLANT
GOWN STRL NON-REIN LRG LVL3 (GOWN DISPOSABLE) ×5 IMPLANT
KIT BASIN OR (CUSTOM PROCEDURE TRAY) ×2 IMPLANT
KIT ROOM TURNOVER OR (KITS) ×2 IMPLANT
NS IRRIG 1000ML POUR BTL (IV SOLUTION) ×2 IMPLANT
PACK CV ACCESS (CUSTOM PROCEDURE TRAY) ×2 IMPLANT
PAD ARMBOARD 7.5X6 YLW CONV (MISCELLANEOUS) ×4 IMPLANT
SPONGE GAUZE 4X4 12PLY (GAUZE/BANDAGES/DRESSINGS) ×2 IMPLANT
SPONGE SURGIFOAM ABS GEL 100 (HEMOSTASIS) IMPLANT
SUT PROLENE 6 0 BV (SUTURE) ×2 IMPLANT
SUT SILK 2 0 SH (SUTURE) ×1 IMPLANT
SUT VIC AB 3-0 SH 27 (SUTURE) ×4
SUT VIC AB 3-0 SH 27X BRD (SUTURE) ×1 IMPLANT
SUT VICRYL 4-0 PS2 18IN ABS (SUTURE) ×3 IMPLANT
TOWEL OR 17X24 6PK STRL BLUE (TOWEL DISPOSABLE) ×2 IMPLANT
TOWEL OR 17X26 10 PK STRL BLUE (TOWEL DISPOSABLE) ×2 IMPLANT
UNDERPAD 30X30 INCONTINENT (UNDERPADS AND DIAPERS) ×2 IMPLANT
WATER STERILE IRR 1000ML POUR (IV SOLUTION) ×2 IMPLANT

## 2012-06-14 NOTE — Op Note (Signed)
NAME: Oscar Castillo    MRN: [id number] DOB: 05-26-1951    DATE OF OPERATION: 06/14/2012  PREOP DIAGNOSIS: chronic kidney disease  POSTOP DIAGNOSIS: same  PROCEDURE: left basilic vein transposition  SURGEON: Di Kindle. Edilia Bo, MD, FACS  ASSIST: Doreatha Massed, PA  ANESTHESIA: local with sedation   EBL: minimal  INDICATIONS: [name] is a 62 y.o. male who presents for new access. The forearm and upper arm cephalic vein did not appear to be adequate. I elected to place a basilic vein transposition.  FINDINGS: the basilic vein was approximately 4.5 mm after it was distended.  TECHNIQUE: The patient was brought to the operating room and sedated by anesthesia. The left upper extremity was prepped and draped in the usual sterile fashion. After the skin was anesthetized with 1% lidocaine, an incision was made over the basilic vein just above the antecubital level. The basilic vein was dissected free and it branched distally. Using 2 additional incisions along the medial aspect of the left upper arm, and after the skin was anesthetized, the basilic vein was harvested up to the axilla. Branches were divided between clips and 3-0 silk ties. A separate longitudinal incision was made over the brachial artery after the skin was anesthetized. The brachial artery was good size was controlled with a vessel loop. A tunnel was created between this incision and the axillary incision and the vein was distended up and marked prevent twisting. The vein was then brought through the tunnel and the patient was heparinized. The brachial artery was clamped proximally and distally and a longitudinal arteriotomy was made. The vein was sewn end-to-side to the artery using continuous 6-0 Prolene suture. At the completion was an excellent thrill in the fistula and a weakly palpable radial pulse. The heparin was partially reversed with protamine. The wounds were closed the deep layer 3-0 Vicryl and the skin closed with  4-0 Vicryl. Dermabond was applied. The patient tolerated the procedure well and was transferred to the recovery room in stable condition. All needle and sponge counts were correct.  Waverly Ferrari, MD, FACS [facility]  DATE OF DICTATION:   06/14/2012

## 2012-06-14 NOTE — Anesthesia Postprocedure Evaluation (Signed)
Anesthesia Post Note  Patient: Oscar Castillo  Procedure(s) Performed: Procedure(s) (LRB): ARTERIOVENOUS (AV) FISTULA CREATION (Left)  Anesthesia type: general  Patient location: PACU  Post pain: Pain level controlled  Post assessment: Patient's Cardiovascular Status Stable  Last Vitals:  Filed Vitals:   06/14/12 1245  BP: 178/90  Pulse: 74  Temp:   Resp: 21    Post vital signs: Reviewed and stable  Level of consciousness: sedated  Complications: No apparent anesthesia complications

## 2012-06-14 NOTE — Progress Notes (Signed)
[facility] PROGRESS NOTE  Oscar Castillo ZOX:[id number] DOB: 25-Jun-1951 DOA: 06/11/2012 PCP: Oscar Barre, MD  Assessment/Plan: Malignant hypertension -Moved out of step down (November 19), weaned off nitro drip -Continue amlodipine, Coreg, clonidine, hydralazine, Imdur -Increase Coreg to 25 mg twice a day -Inability to diurese and severity of CKD has negative for influenza blood pressure control Acute systolic and diastolic heart failure -Ejection fraction 45% -Neg [id number] for the admission -Last chest x-ray on 06/11/2001 revealed interstitial prominence and patchy densities in the bases -Continue furosemide IV CKD stage V -Appreciate nephrology input -Patient remains resistant to starting RRT but has agreed for permanent access -VVS performed left basilic vein transposition (November 19) Diabetes mellitus type 2 -Continue NovoLog sliding scale -Hemoglobin A1c 5.9 -LDL is not at goal--104 -Change to Lipitor 40 mg Anemia of CKD -May need Procrit/Aranesp History of CVA -Continue aspirin     Family Communication:   Pt at beside Disposition Plan:   Home when medically stable   Procedures:  Left basilic vein transposition    Procedures/Studies: Dg Chest 2 View  06/11/2012  *RADIOLOGY REPORT*  Clinical Data: Shortness of breath worsening over the past week. Right chest pressure.  CHEST - 2 VIEW  Comparison: Chest radiograph 08/23/2007  Findings: Two views of the chest demonstrate mild interstitial prominence in the lower lungs.  There is blunting at the left costophrenic angles suggesting volume loss and cannot exclude a small effusion.  Lateral view demonstrates a small area of consolidation involving either the right lower lobe or the posterior right middle lobe.  Linear density in the left mid lung suggestive for subsegmental atelectasis.  Heart size appears stable.  Trachea is midline.  IMPRESSION: Interstitial prominence and patchy densities at the lung bases.  A  small area of consolidation at the right base as described. Findings could represent a combination of mild edema and volume loss.  An infectious etiology cannot be excluded, particularly at the right lung base.  Recommend follow-up to ensure resolution.   Original Report Authenticated By: Oscar Castillo, M.D.          Subjective: Patient denies fevers, chills, chest pain, nausea, vomiting, diarrhea, abdominal pain, dysuria, rashes. His shortness of breath is much improved.   Objective: Filed Vitals:   06/14/12 1300 06/14/12 1335 06/14/12 1500 06/14/12 1635  BP: 173/93 181/119 166/92 166/87  Pulse: 73 75  77  Temp: 97.9 F (36.6 C) 98.2 F (36.8 C)  97.8 F (36.6 C)  TempSrc:      Resp: 20 18  20   Height:      Weight:      SpO2: 97% 94%  98%    Intake/Output Summary (Last 24 hours) at 06/14/12 1748 Last data filed at 06/14/12 1500  Gross per 24 hour  Intake      0 ml  Output   2580 ml  Net  -2580 ml   Weight change: 0 kg (0 lb) Exam:   General:  Pt is alert, follows commands appropriately, not in acute distress  HEENT: No icterus, No thrush, No neck mass, [place]/AT  Cardiovascular: RRR, S1/S2, no rubs, no gallops  Respiratory: Bibasilar crackles. No wheezes or rhonchi  Abdomen: Soft/+BS, non tender, non distended, no guarding  Extremities: 2+ edema, No lymphangitis, No petechiae, No rashes, no synovitis  Data Reviewed: Basic Metabolic Panel:  Lab 06/14/12 8119 06/13/12 0524 06/12/12 0520 06/11/12 0926  NA 135 137 141 144  K 4.1 4.3 3.9 4.5  CL 99 101 107 113*  CO2 21 20 20  --  GLUCOSE 167* 106* 54* 101*  BUN 86* 76* 71* 70*  CREATININE 10.28* 9.79* 9.42* 8.70*  CALCIUM 7.7* 7.9* 8.4 --  MG -- -- 1.9 --  PHOS 6.0* 5.2* 4.9* --   Liver Function Tests:  Lab 06/12/12 0520  AST 20  ALT 18  ALKPHOS 70  BILITOT 0.2*  PROT 6.4  ALBUMIN 2.5*   No results found for this basename: LIPASE:5,AMYLASE:5 in the last 168 hours No results found for this basename:  AMMONIA:5 in the last 168 hours CBC:  Lab 06/13/12 0524 06/12/12 0520 06/11/12 0926 06/11/12 0902  WBC 6.0 5.7 -- 5.8  NEUTROABS -- -- -- 3.5  HGB 8.9* 9.2* 10.5* 10.1*  HCT 27.3* 28.0* 31.0* 30.8*  MCV 82.7 83.1 -- 83.7  PLT 251 257 -- 277   Cardiac Enzymes: No results found for this basename: CKTOTAL:5,CKMB:5,CKMBINDEX:5,TROPONINI:5 in the last 168 hours BNP: No components found with this basename: POCBNP:5 CBG:  Lab 06/14/12 1249 06/14/12 0810 06/13/12 2028 06/13/12 1639 06/13/12 1259  GLUCAP 152* 153* 168* 184* 181*    Recent Results (from the past 240 hour(s))  MRSA PCR SCREENING     Status: Normal   Collection Time   06/11/12  8:45 PM      Component Value Range Status Comment   MRSA by PCR NEGATIVE  NEGATIVE Final   SURGICAL PCR SCREEN     Status: Normal   Collection Time   06/14/12  4:25 AM      Component Value Range Status Comment   MRSA, PCR NEGATIVE  NEGATIVE Final    Staphylococcus aureus NEGATIVE  NEGATIVE Final      Scheduled Meds:   . amLODipine  10 mg Oral Daily  . aspirin EC  81 mg Oral Daily  . brinzolamide  1 drop Right Eye BID   And  . brimonidine  1 drop Right Eye BID  . carvedilol  12.5 mg Oral BID WC  . cloNIDine  0.1 mg Oral TID  . fentaNYL      . furosemide  160 mg Intravenous Q6H  . heparin  5,000 Units Subcutaneous Q8H  . hydrALAZINE  100 mg Oral Q8H  . insulin aspart  0-15 Units Subcutaneous TID WC  . insulin aspart  0-5 Units Subcutaneous QHS  . isosorbide mononitrate  60 mg Oral Daily  . simvastatin  10 mg Oral q1800  . sodium chloride  3 mL Intravenous Q12H  . Travoprost (BAK Free)  1 drop Both Eyes QHS  . [DISCONTINUED] insulin aspart  0-20 Units Subcutaneous TID WC  . [DISCONTINUED] insulin aspart  0-5 Units Subcutaneous QHS   Continuous Infusions:    Oscar Kimbrell, DO  [facility] Pager [phone]  If 7PM-7AM, please contact night-coverage [email or web address] Password [email or web address] 06/14/2012, 5:48 PM   LOS: 3 days

## 2012-06-14 NOTE — Preoperative (Signed)
Beta Blockers   Reason not to administer Beta Blockers:Not Applicable 

## 2012-06-14 NOTE — H&P (Signed)
VASCULAR SURGERY:  This patient was seen yesterday in consultation by Dr. Arbie Cookey. I have been asked to place a left arteriovenous fistula. The procedure and risks have been discussed with the patient preoperatively. All of his questions were answered and he is agreeable to proceed.  Cari Caraway Beeper [phone] 06/14/2012

## 2012-06-14 NOTE — Anesthesia Preprocedure Evaluation (Addendum)
Anesthesia Evaluation  Patient identified by MRN, date of birth, ID band Patient awake    Reviewed: Allergy & Precautions, H&P , NPO status , Patient's Chart, lab work & pertinent test results, reviewed documented beta blocker date and time   History of Anesthesia Complications Negative for: history of anesthetic complications  Airway Mallampati: II TM Distance: >3 FB Neck ROM: Full    Dental  (+) Dental Advisory Given, Edentulous Upper and Partial Lower   Pulmonary neg pulmonary ROS,          Cardiovascular hypertension, Pt. on medications and Pt. on home beta blockers +CHF     Neuro/Psych PSYCHIATRIC DISORDERS Depression CVA, No Residual Symptoms    GI/Hepatic   Endo/Other  diabetes, Insulin DependentMorbid obesity  Renal/GU ESRFRenal disease     Musculoskeletal   Abdominal   Peds  Hematology   Anesthesia Other Findings   Reproductive/Obstetrics                          Anesthesia Physical Anesthesia Plan  ASA: III  Anesthesia Plan: MAC   Post-op Pain Management:    Induction: Intravenous  Airway Management Planned: Simple Face Mask  Additional Equipment:   Intra-op Plan:   Post-operative Plan:   Informed Consent:   Plan Discussed with: Surgeon, CRNA and Anesthesiologist  Anesthesia Plan Comments: (See surgeon's H&P )        Anesthesia Quick Evaluation

## 2012-06-14 NOTE — Transfer of Care (Signed)
Immediate Anesthesia Transfer of Care Note  Patient: Oscar Castillo  Procedure(s) Performed: Procedure(s) (LRB) with comments: ARTERIOVENOUS (AV) FISTULA CREATION (Left) - Left basilic vein transposition with fistula.  Patient Location: PACU  Anesthesia Type:MAC  Level of Consciousness: awake, alert  and oriented  Airway & Oxygen Therapy: Patient Spontanous Breathing  Post-op Assessment: Report given to PACU RN and Post -op Vital signs reviewed and stable  Post vital signs: Reviewed and stable  Complications: No apparent anesthesia complications

## 2012-06-15 LAB — GLUCOSE, CAPILLARY
Glucose-Capillary: 148 mg/dL — ABNORMAL HIGH (ref 70–99)
Glucose-Capillary: 198 mg/dL — ABNORMAL HIGH (ref 70–99)

## 2012-06-15 LAB — BASIC METABOLIC PANEL
Calcium: 7.6 mg/dL — ABNORMAL LOW (ref 8.4–10.5)
GFR calc non Af Amer: 5 mL/min — ABNORMAL LOW (ref >90)
Glucose, Bld: 190 mg/dL — ABNORMAL HIGH (ref 70–99)
Potassium: 4 mEq/L (ref 3.5–5.1)
Sodium: 134 mEq/L — ABNORMAL LOW (ref 135–145)

## 2012-06-15 LAB — PHOSPHORUS: Phosphorus: 5.6 mg/dL — ABNORMAL HIGH (ref 2.3–4.6)

## 2012-06-15 NOTE — Progress Notes (Signed)
Patient ID: Oscar Castillo, male   DOB: 10-25-1950, 61 y.o.   MRN: [id number]   [facility] Progress Note    Subjective:   Reports to be feeling bad today-tired, sleepy and with some nausea. He underwent the placement of a left upper arm basilic vein transposition fistula yesterday. No intra-/postoperative complications noted. Continues to refuse initiation of dialysis at present.    Objective:   BP 175/82  Pulse 83  Temp 98.9 F (37.2 C) (Oral)  Resp 18  Ht 5\' 10"  (1.778 m)  Wt 116.2 kg (256 lb 2.8 oz)  BMI 36.76 kg/m2  SpO2 97%  Intake/Output Summary (Last 24 hours) at 06/15/12 1049 Last data filed at 06/15/12 3664  Gross per 24 hour  Intake 1380.93 ml  Output   1755 ml  Net -374.07 ml   Weight change: 0 kg (0 lb)  Physical Exam: Gen: Comfortably resting out of bed in his recliner, oriented to time place and person. CVS: Pulse regular in rate and rhythm, no murmur or rub Resp: Diminished breath sounds over bases, occasional rales. Abd: Soft, obese, nontender and bowel sounds are normal Ext: 2+ pitting edema over lower extremities-pretibial  Imaging: No results found.  Labs: BMET  Lab 06/15/12 0500 06/14/12 0510 06/13/12 0524 06/12/12 0520 06/11/12 0926  NA 134* 135 137 141 144  K 4.0 4.1 4.3 3.9 4.5  CL 99 99 101 107 113*  CO2 21 21 20 20  --  GLUCOSE 190* 167* 106* 54* 101*  BUN 88* 86* 76* 71* 70*  CREATININE 10.53* 10.28* 9.79* 9.42* 8.70*  ALB -- -- -- -- --  CALCIUM 7.6* 7.7* 7.9* 8.4 --  PHOS 5.6* 6.0* 5.2* 4.9* --   CBC  Lab 06/13/12 0524 06/12/12 0520 06/11/12 0926 06/11/12 0902  WBC 6.0 5.7 -- 5.8  NEUTROABS -- -- -- 3.5  HGB 8.9* 9.2* 10.5* 10.1*  HCT 27.3* 28.0* 31.0* 30.8*  MCV 82.7 83.1 -- 83.7  PLT 251 257 -- 277    Medications:      . amLODipine  10 mg Oral Daily  . aspirin EC  81 mg Oral Daily  . atorvastatin  40 mg Oral q1800  . brinzolamide  1 drop Right Eye BID   And  . brimonidine  1 drop Right Eye BID  .  carvedilol  25 mg Oral BID WC  . cloNIDine  0.1 mg Oral TID  . [EXPIRED] fentaNYL      . furosemide  160 mg Intravenous Q6H  . heparin  5,000 Units Subcutaneous Q8H  . hydrALAZINE  100 mg Oral Q8H  . insulin aspart  0-15 Units Subcutaneous TID WC  . insulin aspart  0-5 Units Subcutaneous QHS  . isosorbide mononitrate  60 mg Oral Daily  . sodium chloride  3 mL Intravenous Q12H  . Travoprost (BAK Free)  1 drop Both Eyes QHS  . [DISCONTINUED] carvedilol  12.5 mg Oral BID WC  . [DISCONTINUED] simvastatin  10 mg Oral q1800     Assessment/ Plan:   1. Hypertensive urgency- blood pressure somewhat improved on antihypertensive therapy as well as diuretics. I continue to inform the patient that dialysis may be ideal at this juncture to help with further blood pressure control. He remains hesitant to initiation at this time. 2. Progressive CKD stage 5 not yet on dialysis. Again he declines RRT at this time despite a lengthy discussion regarding the chronicity and severity of his disease as well as his current pulmonary edema and  hypertensive urgency. He finally agreed to placement of permanent access-LUA BVT fistula placed yesterday-VVS followup has been scheduled 6 weeks postop. He would greatly benefit from initiation of HD and hopefully he will not suffer an untoward event before he accepts this therapy, we'll continue to monitor him over the next few days particularly now that he has started developing fatigue/nausea likely from uremia although cannot entirely rule out effects of recent anesthesia. 3. CHF- diurese, r/o for MI and Echo shows 45% EF with concentric LVH c/w HTN  4. DM- per Primary svc  5. Anemia of chronic disease- check iron stores and consider ESA once volume/BP are under better control  6. Hep C- check LFT's  7. SHPTH- cont with zemplar67mcg daily and follow Phos/ca levels   Zetta Bills, MD 06/15/2012, 10:49 AM

## 2012-06-15 NOTE — Progress Notes (Signed)
VASCULAR PROGRESS NOTE  SUBJECTIVE: Comfortable  PHYSICAL EXAM: Filed Vitals:   06/14/12 1500 06/14/12 1635 06/14/12 2035 06/15/12 0516  BP: 166/92 166/87 147/77 157/86  Pulse:  77 73 79  Temp:  97.8 F (36.6 C) 98.4 F (36.9 C) 99 F (37.2 C)  TempSrc:   Oral Oral  Resp:  20 20 20   Height:   5\' 10"  (1.778 m)   Weight:   256 lb 2.8 oz (116.2 kg)   SpO2:  98% 98% 98%   Palpable left radial pulse Good thrill in left upper arm AVF  ASSESSMENT/PLAN: 1. 1 Day Post-Op s/p: Left basilic vein transposition 2. Will be available if needed. I have arranged f/u in 6 weeks.  Cari Caraway Beeper: [phone] 06/15/2012

## 2012-06-15 NOTE — Progress Notes (Signed)
[facility] PROGRESS NOTE  Oscar Castillo OZH:[id number] DOB: [birth year] DOA: 06/11/2012 PCP: Oscar Barre, MD  Assessment/Plan: Malignant hypertension -Moved out of step down (November 19), weaned off nitro drip -Continue amlodipine, Coreg, clonidine, hydralazine, Imdur -Increase Coreg to 25 mg twice a day  Acute systolic and diastolic heart failure -Ejection fraction 45% -Neg 6075cc for the admission -Last chest x-ray on 06/11/2001 revealed interstitial prominence and patchy densities in the bases -Continue furosemide IV CKD stage V -Appreciate nephrology input -Patient remains resistant to starting RRT but has agreed for permanent access -VVS performed left basilic vein transposition (November 19) - I have encouraged the patient to start HD. Diabetes mellitus type 2 -Continue NovoLog sliding scale -Hemoglobin A1c 5.9 -LDL is not at goal--104 -Change to Lipitor 40 mg Anemia of CKD -May need Procrit/Aranesp History of CVA -Continue aspirin     Family Communication:   Pt at beside Disposition Plan:   Home when medically stable   Procedures:  Left basilic vein transposition    Procedures/Studies: Dg Chest 2 View  06/11/2012  *RADIOLOGY REPORT*  Clinical Data: Shortness of breath worsening over the past week. Right chest pressure.  CHEST - 2 VIEW  Comparison: Chest radiograph 08/23/2007  Findings: Two views of the chest demonstrate mild interstitial prominence in the lower lungs.  There is blunting at the left costophrenic angles suggesting volume loss and cannot exclude a small effusion.  Lateral view demonstrates a small area of consolidation involving either the right lower lobe or the posterior right middle lobe.  Linear density in the left mid lung suggestive for subsegmental atelectasis.  Heart size appears stable.  Trachea is midline.  IMPRESSION: Interstitial prominence and patchy densities at the lung bases.  A small area of consolidation at the right base as  described. Findings could represent a combination of mild edema and volume loss.  An infectious etiology cannot be excluded, particularly at the right lung base.  Recommend follow-up to ensure resolution.   Original Report Authenticated By: Oscar Castillo, M.D.          Subjective: Reports feeling fine   Objective: Filed Vitals:   06/14/12 2035 06/15/12 0516 06/15/12 0923 06/15/12 1323  BP: 147/77 157/86 175/82 142/77  Pulse: 73 79 83 75  Temp: 98.4 F (36.9 C) 99 F (37.2 C) 98.9 F (37.2 C) 98.2 F (36.8 C)  TempSrc: Oral Oral    Resp: 20 20 18 18   Height: 5\' 10"  (1.778 m)     Weight: 116.2 kg (256 lb 2.8 oz)     SpO2: 98% 98% 97% 96%    Intake/Output Summary (Last 24 hours) at 06/15/12 1340 Last data filed at 06/15/12 1324  Gross per 24 hour  Intake 1620.93 ml  Output   1725 ml  Net -104.07 ml   Weight change: 0 kg (0 lb) Exam:   General:  Pt is alert, follows commands appropriately, not in acute distress  HEENT: No icterus, No thrush, No neck mass, [place]/AT  Cardiovascular: RRR, S1/S2, no rubs, no gallops  Respiratory: Bibasilar crackles. No wheezes or rhonchi  Abdomen: Soft/+BS, non tender, non distended, no guarding  Extremities: 2+ edema,  Data Reviewed: Basic Metabolic Panel:  Lab 06/15/12 6295 06/14/12 0510 06/13/12 0524 06/12/12 0520 06/11/12 0926  NA 134* 135 137 141 144  K 4.0 4.1 4.3 3.9 4.5  CL 99 99 101 107 113*  CO2 21 21 20 20  --  GLUCOSE 190* 167* 106* 54* 101*  BUN 88* 86* 76* 71*  70*  CREATININE 10.53* 10.28* 9.79* 9.42* 8.70*  CALCIUM 7.6* 7.7* 7.9* 8.4 --  MG -- -- -- 1.9 --  PHOS 5.6* 6.0* 5.2* 4.9* --   Liver Function Tests:  Lab 06/12/12 0520  AST 20  ALT 18  ALKPHOS 70  BILITOT 0.2*  PROT 6.4  ALBUMIN 2.5*   No results found for this basename: LIPASE:5,AMYLASE:5 in the last 168 hours No results found for this basename: AMMONIA:5 in the last 168 hours CBC:  Lab 06/13/12 0524 06/12/12 0520 06/11/12 0926 06/11/12 0902    WBC 6.0 5.7 -- 5.8  NEUTROABS -- -- -- 3.5  HGB 8.9* 9.2* 10.5* 10.1*  HCT 27.3* 28.0* 31.0* 30.8*  MCV 82.7 83.1 -- 83.7  PLT 251 257 -- 277   Cardiac Enzymes: No results found for this basename: CKTOTAL:5,CKMB:5,CKMBINDEX:5,TROPONINI:5 in the last 168 hours BNP: No components found with this basename: POCBNP:5 CBG:  Lab 06/15/12 0731 06/14/12 2034 06/14/12 1637 06/14/12 1249 06/14/12 0810  GLUCAP 148* 177* 136* 152* 153*    Recent Results (from the past 240 hour(s))  MRSA PCR SCREENING     Status: Normal   Collection Time   06/11/12  8:45 PM      Component Value Range Status Comment   MRSA by PCR NEGATIVE  NEGATIVE Final   SURGICAL PCR SCREEN     Status: Normal   Collection Time   06/14/12  4:25 AM      Component Value Range Status Comment   MRSA, PCR NEGATIVE  NEGATIVE Final    Staphylococcus aureus NEGATIVE  NEGATIVE Final      Scheduled Meds:    . amLODipine  10 mg Oral Daily  . aspirin EC  81 mg Oral Daily  . atorvastatin  40 mg Oral q1800  . brinzolamide  1 drop Right Eye BID   And  . brimonidine  1 drop Right Eye BID  . carvedilol  25 mg Oral BID WC  . cloNIDine  0.1 mg Oral TID  . [EXPIRED] fentaNYL      . furosemide  160 mg Intravenous Q6H  . heparin  5,000 Units Subcutaneous Q8H  . hydrALAZINE  100 mg Oral Q8H  . insulin aspart  0-15 Units Subcutaneous TID WC  . insulin aspart  0-5 Units Subcutaneous QHS  . isosorbide mononitrate  60 mg Oral Daily  . sodium chloride  3 mL Intravenous Q12H  . Travoprost (BAK Free)  1 drop Both Eyes QHS  . [DISCONTINUED] carvedilol  12.5 mg Oral BID WC  . [DISCONTINUED] simvastatin  10 mg Oral q1800   Continuous Infusions:    Oscar Cowman, MD  [facility] Pager [phone]  If 7PM-7AM, please contact night-coverage [email or web address] Password [email or web address] 06/15/2012, 1:40 PM   LOS: 4 days

## 2012-06-16 ENCOUNTER — Encounter (HOSPITAL_COMMUNITY): Payer: Self-pay | Admitting: Vascular Surgery

## 2012-06-16 LAB — BASIC METABOLIC PANEL
CO2: 21 mEq/L (ref 19–32)
Chloride: 99 mEq/L (ref 96–112)
GFR calc Af Amer: 5 mL/min — ABNORMAL LOW (ref >90)
Potassium: 3.9 mEq/L (ref 3.5–5.1)
Sodium: 136 mEq/L (ref 135–145)

## 2012-06-16 LAB — GLUCOSE, CAPILLARY
Glucose-Capillary: 179 mg/dL — ABNORMAL HIGH (ref 70–99)
Glucose-Capillary: 148 mg/dL — ABNORMAL HIGH (ref 70–99)
Glucose-Capillary: 168 mg/dL — ABNORMAL HIGH (ref 70–99)

## 2012-06-16 NOTE — Progress Notes (Signed)
Patient ID: KAMAAL CAST, male   DOB: 11/12/1950, 61 y.o.   MRN: [id number]   [facility] Progress Note    Subjective:   Reports to be feeling fatigued/tired and with poor appetite. Denies any nausea or dyspepsia and reports that he slept poorly.    Objective:   BP 187/91  Pulse 84  Temp 98.2 F (36.8 C) (Oral)  Resp 18  Ht 5\' 10"  (1.778 m)  Wt 115.849 kg (255 lb 6.4 oz)  BMI 36.65 kg/m2  SpO2 97%  Intake/Output Summary (Last 24 hours) at 06/16/12 1007 Last data filed at 06/16/12 0700  Gross per 24 hour  Intake    752 ml  Output    400 ml  Net    352 ml   Weight change: -0.351 kg (-12.4 oz)  Physical Exam: Gen: Comfortably resting in bed, intermittently sleepy-had Percocet earlier today CVS: Pulse regular in rate and rhythm, heart sounds S1 and S2 normal Resp: Bibasal rales, no wheeze/rhonchi Abd: Soft, obese, nontender and bowel sounds are normal Ext: 2+ edema bipedally, good thrill and bruit over left upper arm arteriovenous fistula. No asterixis  Imaging: No results found.  Labs: BMET  Lab 06/16/12 0625 06/15/12 0500 06/14/12 0510 06/13/12 0524 06/12/12 0520 06/11/12 0926  NA -- 134* 135 137 141 144  K -- 4.0 4.1 4.3 3.9 4.5  CL -- 99 99 101 107 113*  CO2 -- 21 21 20 20  --  GLUCOSE -- 190* 167* 106* 54* 101*  BUN -- 88* 86* 76* 71* 70*  CREATININE -- 10.53* 10.28* 9.79* 9.42* 8.70*  ALB -- -- -- -- -- --  CALCIUM -- 7.6* 7.7* 7.9* 8.4 --  PHOS 5.8* 5.6* 6.0* 5.2* 4.9* --   CBC  Lab 06/13/12 0524 06/12/12 0520 06/11/12 0926 06/11/12 0902  WBC 6.0 5.7 -- 5.8  NEUTROABS -- -- -- 3.5  HGB 8.9* 9.2* 10.5* 10.1*  HCT 27.3* 28.0* 31.0* 30.8*  MCV 82.7 83.1 -- 83.7  PLT 251 257 -- 277    Medications:      . amLODipine  10 mg Oral Daily  . aspirin EC  81 mg Oral Daily  . atorvastatin  40 mg Oral q1800  . brinzolamide  1 drop Right Eye BID   And  . brimonidine  1 drop Right Eye BID  . carvedilol  25 mg Oral BID WC  . cloNIDine  0.1 mg  Oral TID  . furosemide  160 mg Intravenous Q6H  . heparin  5,000 Units Subcutaneous Q8H  . hydrALAZINE  100 mg Oral Q8H  . insulin aspart  0-15 Units Subcutaneous TID WC  . insulin aspart  0-5 Units Subcutaneous QHS  . isosorbide mononitrate  60 mg Oral Daily  . sodium chloride  3 mL Intravenous Q12H  . Travoprost (BAK Free)  1 drop Both Eyes QHS     Assessment/ Plan:   1. Hypertensive urgency- elevated blood pressures noted on current therapy. I continued to inform the patient that dialysis may be ideal at this juncture to help with further blood pressure control. He remains hesitant to initiation at this time.  2. Progressive CKD stage 5 not yet on dialysis. I spent 15 minutes trying to educate the patient and answer all his questions as to why he needs to initiate dialysis and avoid any emerging uremic symptoms-he remains reluctant to start at this time. He finally agreed to placement of permanent access-LUA BVT fistula placed yesterday-VVS followup has been scheduled 6  weeks postop. Will check metabolic panel today. 3. CHF- diurese, r/o for MI and Echo shows 45% EF with concentric LVH c/w HTN  4. DM- per Primary svc  5. Anemia of chronic disease- check iron stores and consider ESA once volume/BP are under better control  6. Hep C- check LFT's  7. SHPTH- cont with zemplar68mcg daily and follow Phos/ca levels  Zetta Bills, MD 06/16/2012, 10:07 AM

## 2012-06-16 NOTE — Progress Notes (Signed)
Hypoglycemic Event  CBG: 43  Treatment: 15 GM carbohydrate snack  Symptoms: None  Follow-up CBG: Time:12:20 CBG Result:50 Possible Reasons for Event: Inadequate meal intake   Oscar Castillo  Remember to initiate Hypoglycemia Order Set & complete  Hypoglycemic Event  CBG: 50  Treatment: 15 GM carbohydrate snack  Symptoms: None  Follow-up CBG: Time:1235 CBG Result:56  Possible Reasons for Event: Inadequate meal intake    Oscar Castillo  Remember to initiate Hypoglycemia Order Set & complete Hypoglycemic Event  CBG: 56  Treatment: 15 GM carbohydrate snack  Symptoms: None  Follow-up CBG: Time:1250 CBG Result:99  Possible Reasons for Event: Inadequate meal intake    Oscar Castillo  Remember to initiate Hypoglycemia Order Set & complete

## 2012-06-16 NOTE — Progress Notes (Signed)
[facility] PROGRESS NOTE  Oscar Castillo ZOX:[id number] DOB: 01/18/1951 DOA: 06/11/2012 PCP: Oscar Barre, MD  Assessment/Plan: Malignant hypertension -Moved out of step down (November 19), weaned off nitro drip -Continue amlodipine, Coreg, clonidine, hydralazine, Imdur   Acute systolic and diastolic heart failure -Ejection fraction 45% -Neg 6075cc for the admission -Last chest x-ray on 06/11/2001 revealed interstitial prominence and patchy densities in the bases -Continue furosemide IV CKD stage V -Appreciate nephrology input -Patient remains resistant to starting RRT but has agreed for permanent access -VVS performed left basilic vein transposition (November 19) - I have encouraged the patient to start HD. Diabetes mellitus type 2 -Continue NovoLog sliding scale -Hemoglobin A1c 5.9 -LDL is not at goal--104 -Change to Lipitor 40 mg Anemia of CKD -May need Procrit/Aranesp History of CVA -Continue aspirin     Family Communication:   Pt at beside Disposition Plan:   Home when medically stable   Procedures:  Left basilic vein transposition    Procedures/Studies: Dg Chest 2 View  06/11/2012  *RADIOLOGY REPORT*  Clinical Data: Shortness of breath worsening over the past week. Right chest pressure.  CHEST - 2 VIEW  Comparison: Chest radiograph 08/23/2007  Findings: Two views of the chest demonstrate mild interstitial prominence in the lower lungs.  There is blunting at the left costophrenic angles suggesting volume loss and cannot exclude a small effusion.  Lateral view demonstrates a small area of consolidation involving either the right lower lobe or the posterior right middle lobe.  Linear density in the left mid lung suggestive for subsegmental atelectasis.  Heart size appears stable.  Trachea is midline.  IMPRESSION: Interstitial prominence and patchy densities at the lung bases.  A small area of consolidation at the right base as described. Findings could represent  a combination of mild edema and volume loss.  An infectious etiology cannot be excluded, particularly at the right lung base.  Recommend follow-up to ensure resolution.   Original Report Authenticated By: Richarda Overlie, M.D.          Subjective: Reports feeling fine although vomited   Objective: Filed Vitals:   06/15/12 1323 06/15/12 2106 06/16/12 0625 06/16/12 0856  BP: 142/77 156/81 178/92 187/91  Pulse: 75 76 68 84  Temp: 98.2 F (36.8 C) 98.5 F (36.9 C) 98.5 F (36.9 C) 98.2 F (36.8 C)  TempSrc:  Oral Oral Oral  Resp: 18 18 18 18   Height:  5\' 10"  (1.778 m)    Weight:  115.849 kg (255 lb 6.4 oz)    SpO2: 96% 97% 95% 97%    Intake/Output Summary (Last 24 hours) at 06/16/12 1003 Last data filed at 06/16/12 0700  Gross per 24 hour  Intake    752 ml  Output    400 ml  Net    352 ml   Weight change: -0.351 kg (-12.4 oz) Exam:   General:  Pt is alert, follows commands appropriately, not in acute distress  HEENT: No icterus, No thrush, No neck mass, [place]/AT  Cardiovascular: RRR, S1/S2, no rubs, no gallops  Respiratory: Bibasilar crackles. No wheezes or rhonchi  Abdomen: Soft/+BS, non tender, non distended, no guarding  Extremities: 2+ edema,  Data Reviewed: Basic Metabolic Panel:  Lab 06/16/12 8119 06/15/12 0500 06/14/12 0510 06/13/12 0524 06/12/12 0520 06/11/12 0926  NA -- 134* 135 137 141 144  K -- 4.0 4.1 4.3 3.9 4.5  CL -- 99 99 101 107 113*  CO2 -- 21 21 20 20  --  GLUCOSE -- 190* 167*  106* 54* 101*  BUN -- 88* 86* 76* 71* 70*  CREATININE -- 10.53* 10.28* 9.79* 9.42* 8.70*  CALCIUM -- 7.6* 7.7* 7.9* 8.4 --  MG -- -- -- -- 1.9 --  PHOS 5.8* 5.6* 6.0* 5.2* 4.9* --   Liver Function Tests:  Lab 06/12/12 0520  AST 20  ALT 18  ALKPHOS 70  BILITOT 0.2*  PROT 6.4  ALBUMIN 2.5*   No results found for this basename: LIPASE:5,AMYLASE:5 in the last 168 hours No results found for this basename: AMMONIA:5 in the last 168 hours CBC:  Lab 06/13/12 0524  06/12/12 0520 06/11/12 0926 06/11/12 0902  WBC 6.0 5.7 -- 5.8  NEUTROABS -- -- -- 3.5  HGB 8.9* 9.2* 10.5* 10.1*  HCT 27.3* 28.0* 31.0* 30.8*  MCV 82.7 83.1 -- 83.7  PLT 251 257 -- 277   Cardiac Enzymes: No results found for this basename: CKTOTAL:5,CKMB:5,CKMBINDEX:5,TROPONINI:5 in the last 168 hours BNP: No components found with this basename: POCBNP:5 CBG:  Lab 06/16/12 0744 06/15/12 2104 06/15/12 1713 06/15/12 1116 06/15/12 0731  GLUCAP 179* 206* 198* 191* 148*    Recent Results (from the past 240 hour(s))  MRSA PCR SCREENING     Status: Normal   Collection Time   06/11/12  8:45 PM      Component Value Range Status Comment   MRSA by PCR NEGATIVE  NEGATIVE Final   SURGICAL PCR SCREEN     Status: Normal   Collection Time   06/14/12  4:25 AM      Component Value Range Status Comment   MRSA, PCR NEGATIVE  NEGATIVE Final    Staphylococcus aureus NEGATIVE  NEGATIVE Final      Scheduled Meds:    . amLODipine  10 mg Oral Daily  . aspirin EC  81 mg Oral Daily  . atorvastatin  40 mg Oral q1800  . brinzolamide  1 drop Right Eye BID   And  . brimonidine  1 drop Right Eye BID  . carvedilol  25 mg Oral BID WC  . cloNIDine  0.1 mg Oral TID  . furosemide  160 mg Intravenous Q6H  . heparin  5,000 Units Subcutaneous Q8H  . hydrALAZINE  100 mg Oral Q8H  . insulin aspart  0-15 Units Subcutaneous TID WC  . insulin aspart  0-5 Units Subcutaneous QHS  . isosorbide mononitrate  60 mg Oral Daily  . sodium chloride  3 mL Intravenous Q12H  . Travoprost (BAK Free)  1 drop Both Eyes QHS   Continuous Infusions:    Oscar Parkerson, MD  [facility] Pager [phone]  If 7PM-7AM, please contact night-coverage [email or web address] Password [email or web address] 06/16/2012, 10:03 AM   LOS: 5 days

## 2012-06-17 ENCOUNTER — Inpatient Hospital Stay (HOSPITAL_COMMUNITY): Payer: Medicare Other

## 2012-06-17 ENCOUNTER — Encounter (HOSPITAL_COMMUNITY): Payer: Self-pay | Admitting: Radiology

## 2012-06-17 DIAGNOSIS — N185 Chronic kidney disease, stage 5: Secondary | ICD-10-CM

## 2012-06-17 LAB — RENAL FUNCTION PANEL
CO2: 22 mEq/L (ref 19–32)
Calcium: 8.2 mg/dL — ABNORMAL LOW (ref 8.4–10.5)
Chloride: 95 mEq/L — ABNORMAL LOW (ref 96–112)
GFR calc Af Amer: 4 mL/min — ABNORMAL LOW (ref >90)
GFR calc non Af Amer: 4 mL/min — ABNORMAL LOW (ref >90)
Glucose, Bld: 200 mg/dL — ABNORMAL HIGH (ref 70–99)
Potassium: 4.3 mEq/L (ref 3.5–5.1)
Sodium: 134 mEq/L — ABNORMAL LOW (ref 135–145)

## 2012-06-17 LAB — GLUCOSE, CAPILLARY
Glucose-Capillary: 159 mg/dL — ABNORMAL HIGH (ref 70–99)
Glucose-Capillary: 204 mg/dL — ABNORMAL HIGH (ref 70–99)

## 2012-06-17 LAB — PROTIME-INR: INR: 1.03 (ref 0.00–1.49)

## 2012-06-17 MED ORDER — MIDAZOLAM HCL 2 MG/2ML IJ SOLN
INTRAMUSCULAR | Status: AC | PRN
  Administered 2012-06-17: 1 mg via INTRAVENOUS

## 2012-06-17 MED ORDER — LIDOCAINE HCL (PF) 1 % IJ SOLN: 5.0000 mL | INTRAMUSCULAR | Status: DC | PRN

## 2012-06-17 MED ORDER — SODIUM CHLORIDE 0.9 % IV SOLN: 100.0000 mL | INTRAVENOUS | Status: DC | PRN

## 2012-06-17 MED ORDER — HEPARIN SODIUM (PORCINE) 1000 UNIT/ML DIALYSIS
2000.0000 [IU] | INTRAMUSCULAR | Status: DC | PRN
  Filled 2012-06-17: qty 2

## 2012-06-17 MED ORDER — FENTANYL CITRATE 0.05 MG/ML IJ SOLN
INTRAMUSCULAR | Status: AC | PRN
  Administered 2012-06-17: 50 ug via INTRAVENOUS

## 2012-06-17 MED ORDER — CEFAZOLIN SODIUM-DEXTROSE 2-3 GM-% IV SOLR
2.0000 g | INTRAVENOUS | Status: AC
  Administered 2012-06-17: 2 g via INTRAVENOUS
  Filled 2012-06-17: qty 50

## 2012-06-17 MED ORDER — ALTEPLASE 2 MG IJ SOLR
2.0000 mg | Freq: Once | INTRAMUSCULAR | Status: DC | PRN
  Filled 2012-06-17: qty 2

## 2012-06-17 MED ORDER — NEPRO/CARBSTEADY PO LIQD: 237.0000 mL | ORAL | Status: DC | PRN

## 2012-06-17 MED ORDER — HEPARIN SODIUM (PORCINE) 1000 UNIT/ML DIALYSIS
1000.0000 [IU] | INTRAMUSCULAR | Status: DC | PRN
  Filled 2012-06-17: qty 1

## 2012-06-17 MED ORDER — LIDOCAINE-PRILOCAINE 2.5-2.5 % EX CREA: 1.0000 "application " | TOPICAL_CREAM | CUTANEOUS | Status: DC | PRN

## 2012-06-17 MED ORDER — PENTAFLUOROPROP-TETRAFLUOROETH EX AERO: 1.0000 "application " | INHALATION_SPRAY | CUTANEOUS | Status: DC | PRN

## 2012-06-17 NOTE — Progress Notes (Signed)
Patient ID: Oscar Castillo, male   DOB: May 14, 1951, 61 y.o.   MRN: [id number]   [facility] Progress Note    Subjective:   The patient vomited last night-1 episode and this morning feels a little tired. He discussed with his wife and is now open to the idea of starting dialysis.    Objective:   BP 141/77  Pulse 70  Temp 98 F (36.7 C) (Oral)  Resp 18  Ht 5\' 10"  (1.778 m)  Wt 115.9 kg (255 lb 8.2 oz)  BMI 36.66 kg/m2  SpO2 97%  Intake/Output Summary (Last 24 hours) at 06/17/12 1032 Last data filed at 06/17/12 1006  Gross per 24 hour  Intake    603 ml  Output   1150 ml  Net   -547 ml   Weight change: 0.051 kg (1.8 oz)  Physical Exam: Gen: Comfortably resting up in his recliner, intermittently sleepy CVS: Pulse regular in rate and rhythm, heart sounds S1 and S2 normal Resp: Fine rales right base otherwise clear to auscultation bilaterally, no wheeze Abd: Soft, obese, nontender and bowel sounds are normal Ext: 2+ lower extremity edema, excellent thrill over left upper arm arteriovenous fistula  Imaging: No results found.  Labs: BMET  Lab 06/17/12 0435 06/16/12 1125 06/16/12 0625 06/15/12 0500 06/14/12 0510 06/13/12 0524 06/12/12 0520 06/11/12 0926  NA 134* 136 -- 134* 135 137 141 144  K 4.3 3.9 -- 4.0 4.1 4.3 3.9 4.5  CL 95* 99 -- 99 99 101 107 113*  CO2 22 21 -- 21 21 20 20  --  GLUCOSE 200* 172* -- 190* 167* 106* 54* 101*  BUN 91* 90* -- 88* 86* 76* 71* 70*  CREATININE 12.24* 11.45* -- 10.53* 10.28* 9.79* 9.42* 8.70*  ALB -- -- -- -- -- -- -- --  CALCIUM 8.2* 7.8* -- 7.6* 7.7* 7.9* 8.4 --  PHOS 6.8* -- 5.8* 5.6* 6.0* 5.2* 4.9* --   CBC  Lab 06/13/12 0524 06/12/12 0520 06/11/12 0926 06/11/12 0902  WBC 6.0 5.7 -- 5.8  NEUTROABS -- -- -- 3.5  HGB 8.9* 9.2* 10.5* 10.1*  HCT 27.3* 28.0* 31.0* 30.8*  MCV 82.7 83.1 -- 83.7  PLT 251 257 -- 277    Medications:      . amLODipine  10 mg Oral Daily  . aspirin EC  81 mg Oral Daily  . atorvastatin  40  mg Oral q1800  . brinzolamide  1 drop Right Eye BID   And  . brimonidine  1 drop Right Eye BID  . carvedilol  25 mg Oral BID WC  . cloNIDine  0.1 mg Oral TID  . furosemide  160 mg Intravenous Q6H  . heparin  5,000 Units Subcutaneous Q8H  . hydrALAZINE  100 mg Oral Q8H  . insulin aspart  0-15 Units Subcutaneous TID WC  . insulin aspart  0-5 Units Subcutaneous QHS  . isosorbide mononitrate  60 mg Oral Daily  . sodium chloride  3 mL Intravenous Q12H  . Travoprost (BAK Free)  1 drop Both Eyes QHS     Assessment/ Plan:   1. Hypertensive urgency- elevated blood pressures noted on current therapy. The patient is now agreeable to dialysis, anticipate blood pressure improvement with ultrafiltration.  2. Progressive CKD stage 5 not yet on dialysis. The patient is agreeable now to start dialysis, we'll consult vascular surgery or interventional radiology for tunneled hemodialysis catheter as his fistula is less than one week old.  3. CHF- diurese, r/o for MI  and Echo shows 45% EF with concentric LVH c/w HTN  4. DM- per Primary svc  5. Anemia of chronic disease- check iron stores and consider ESA once volume/BP are under better control  6. Hep C- check LFT's  7. SHPTH- cont with zemplar24mcg daily and follow Phos/ca leve  Zetta Bills, MD 06/17/2012, 10:32 AM

## 2012-06-17 NOTE — H&P (Signed)
Chief Complaint: Renal failure Referring Physician:Patel HPI: Oscar Castillo is an 61 y.o. male with Stage V renal failure who now in need of hemodialysis. He had a (L)UE AVF created recently that is not yet mature. He will need HD before the AVF is mature and IR is requested to place tunneled HD catheter. He has not had one before. PMHx and meds reviewed.  Past Medical History:  Past Medical History  Diagnosis Date  . Abdominal pain, generalized 05/05/2007  . ABDOMINAL TENDERNESS 11/06/2009  . ABDOMINAL ULTRASOUND, ABNORMAL 04/23/2010  . ALLERGIC RHINITIS 05/05/2007  . BACK PAIN, LUMBAR, CHRONIC 08/06/2009  . BENIGN PROSTATIC HYPERTROPHY 08/01/2010  . CEREBROVASCULAR ACCIDENT, HX OF 08/06/2009  . CHOLELITHIASIS 08/01/2010  . CHRONIC KIDNEY DISEASE UNSPECIFIED 11/06/2009  . CONGESTIVE HEART FAILURE 03/18/2009  . DEPRESSION 03/18/2009  . DIABETES MELLITUS, TYPE II 03/25/2007  . ERECTILE DYSFUNCTION 03/25/2007  . FLANK PAIN, RIGHT 11/06/2009  . GERD 03/25/2007  . HEPATITIS C, HX OF 03/25/2007  . HYPERTENSION 03/25/2007  . Morbid obesity 03/25/2007  . NEPHROLITHIASIS, HX OF 03/18/2009  . RENAL INSUFFICIENCY 08/06/2009  . SHOULDER PAIN, RIGHT 09/18/2009  . THUMB PAIN, RIGHT 04/23/2010  . WEAKNESS 04/23/2010    Past Surgical History:  Past Surgical History  Procedure Date  . Nephrectomy     partial RR  . Av fistula placement 06/14/2012    Procedure: ARTERIOVENOUS (AV) FISTULA CREATION;  Surgeon: Chuck Hint, MD;  Location: [facility] OR;  Service: Vascular;  Laterality: Left;  Left basilic vein transposition with fistula.    Family History:  Family History  Problem Relation Age of Onset  . Coronary artery disease Other   . Diabetes Other     Social History:  reports that he has never smoked. He does not have any smokeless tobacco history on file. He reports that he does not drink alcohol or use illicit drugs.  Allergies: No Known Allergies  Medications: Medications Prior to Admission    Medication Sig Dispense Refill  . amLODipine (NORVASC) 10 MG tablet Take 1 tablet (10 mg total) by mouth daily.  90 tablet  3  . aspirin 81 MG EC tablet Take 81 mg by mouth daily.        . Brinzolamide-Brimonidine (SIMBRINZA) 1-0.2 % SUSP Place 1 drop into the right eye 2 (two) times daily.      . cloNIDine (CATAPRES) 0.1 MG tablet Take 1 tablet (0.1 mg total) by mouth 2 (two) times daily.  180 tablet  2  . furosemide (LASIX) 40 MG tablet Take 80 mg by mouth 2 (two) times daily.      [name] glipiZIDE (GLUCOTROL) 10 MG tablet Take 10 mg by mouth daily as needed. If sugar is high      . hydrALAZINE (APRESOLINE) 100 MG tablet Take 1 tablet (100 mg total) by mouth 3 (three) times daily.  90 tablet  11  . insulin glargine (LANTUS) 100 UNIT/ML injection Inject 10-11 Units into the skin at bedtime.      [name] labetalol (NORMODYNE) 300 MG tablet Take 1 tablet (300 mg total) by mouth 2 (two) times daily.  180 tablet  3  . pravastatin (PRAVACHOL) 20 MG tablet Take one (1) tablet(s) once daily  90 tablet  3  . traMADol (ULTRAM) 50 MG tablet Take 50 mg by mouth 4 (four) times daily as needed. For pain      . Travoprost, BAK Free, (TRAVATAN) 0.004 % SOLN ophthalmic solution Place 1 drop into both eyes at bedtime.  Please HPI for pertinent positives, otherwise complete 10 system ROS negative.  Physical Exam: Blood pressure 160/80, pulse 73, temperature 98 F (36.7 C), temperature source Oral, resp. rate 19, height 5\' 10"  (1.778 m), weight 255 lb 8.2 oz (115.9 kg), SpO2 96.00%. Body mass index is 36.66 kg/(m^2).   General Appearance:  Alert, cooperative, no distress, appears stated age  Head:  Normocephalic, without obvious abnormality, atraumatic  ENT: Unremarkable  Neck: Supple, symmetrical, trachea midline, no adenopathy, thyroid: not enlarged, symmetric, no tenderness/mass/nodules  Lungs:   Clear to auscultation bilaterally, no w/r/r, respirations unlabored without use of accessory muscles.  Chest  Wall:  No tenderness or deformity, scars  Heart:  Regular rate and rhythm, S1, S2 normal, no murmur, rub or gallop. Carotids 2+ without bruit.  Extremities: Extremities normal, atraumatic, no cyanosis or edema. (L)UE incisions healing well, no signs of infection  Neurologic: Normal affect, no gross deficits.   Results for orders placed during the hospital encounter of 06/11/12 (from the past 48 hour(s))  GLUCOSE, CAPILLARY     Status: Abnormal   Collection Time   06/15/12  5:13 PM      Component Value Range Comment   Glucose-Capillary 198 (*) 70 - 99 mg/dL   GLUCOSE, CAPILLARY     Status: Abnormal   Collection Time   06/15/12  9:04 PM      Component Value Range Comment   Glucose-Capillary 206 (*) 70 - 99 mg/dL    Comment 1 Documented in Chart      Comment 2 Notify RN     PHOSPHORUS     Status: Abnormal   Collection Time   06/16/12  6:25 AM      Component Value Range Comment   Phosphorus 5.8 (*) 2.3 - 4.6 mg/dL   GLUCOSE, CAPILLARY     Status: Abnormal   Collection Time   06/16/12  7:44 AM      Component Value Range Comment   Glucose-Capillary 179 (*) 70 - 99 mg/dL   BASIC METABOLIC PANEL     Status: Abnormal   Collection Time   06/16/12 11:25 AM      Component Value Range Comment   Sodium 136  135 - 145 mEq/L    Potassium 3.9  3.5 - 5.1 mEq/L    Chloride 99  96 - 112 mEq/L    CO2 21  19 - 32 mEq/L    Glucose, Bld 172 (*) 70 - 99 mg/dL    BUN 90 (*) 6 - 23 mg/dL    Creatinine, Ser 62.13 (*) 0.50 - 1.35 mg/dL    Calcium 7.8 (*) 8.4 - 10.5 mg/dL    GFR calc non Af Amer 4 (*) >90 mL/min    GFR calc Af Amer 5 (*) >90 mL/min   GLUCOSE, CAPILLARY     Status: Abnormal   Collection Time   06/16/12 12:10 PM      Component Value Range Comment   Glucose-Capillary 148 (*) 70 - 99 mg/dL   GLUCOSE, CAPILLARY     Status: Abnormal   Collection Time   06/16/12  5:02 PM      Component Value Range Comment   Glucose-Capillary 163 (*) 70 - 99 mg/dL   GLUCOSE, CAPILLARY     Status:  Abnormal   Collection Time   06/16/12  8:53 PM      Component Value Range Comment   Glucose-Capillary 168 (*) 70 - 99 mg/dL   RENAL FUNCTION PANEL     Status:  Abnormal   Collection Time   06/17/12  4:35 AM      Component Value Range Comment   Sodium 134 (*) 135 - 145 mEq/L    Potassium 4.3  3.5 - 5.1 mEq/L    Chloride 95 (*) 96 - 112 mEq/L    CO2 22  19 - 32 mEq/L    Glucose, Bld 200 (*) 70 - 99 mg/dL    BUN 91 (*) 6 - 23 mg/dL    Creatinine, Ser 30.86 (*) 0.50 - 1.35 mg/dL    Calcium 8.2 (*) 8.4 - 10.5 mg/dL    Phosphorus 6.8 (*) 2.3 - 4.6 mg/dL    Albumin 2.5 (*) 3.5 - 5.2 g/dL    GFR calc non Af Amer 4 (*) >90 mL/min    GFR calc Af Amer 4 (*) >90 mL/min   GLUCOSE, CAPILLARY     Status: Abnormal   Collection Time   06/17/12  7:51 AM      Component Value Range Comment   Glucose-Capillary 159 (*) 70 - 99 mg/dL   GLUCOSE, CAPILLARY     Status: Abnormal   Collection Time   06/17/12 11:36 AM      Component Value Range Comment   Glucose-Capillary 204 (*) 70 - 99 mg/dL    No results found.  Assessment/Plan ESRD in need of HD access For tunneled HD access catheter today Will check stat coags Discussed procedure and risks, including use of sedation Consent signed in chart  Brayton El PA-C 06/17/2012, 12:53 PM

## 2012-06-17 NOTE — Procedures (Signed)
Successful RT IJ HD CATH TIPS SVC/RA NO COMP STABLE READT FOR USE FULL REPORT IN PACS

## 2012-06-17 NOTE — Progress Notes (Signed)
[facility] PROGRESS NOTE  Oscar Castillo ZOX:[id number] DOB: 07-29-1950 DOA: 06/11/2012 PCP: Oliver Barre, MD  Assessment/Plan: Malignant hypertension -Moved out of step down (November 19), weaned off nitro drip -Continue amlodipine, Coreg, clonidine, hydralazine, Imdur - will need adjustment of meds once on HD   Acute systolic and diastolic heart failure -Ejection fraction 45% -Neg 6075cc for the admission -Last chest x-ray on 06/11/2001 revealed interstitial prominence and patchy densities in the bases -Continue furosemide IV CKD stage V -Appreciate nephrology input -Patient remains resistant to starting RRT but has agreed for permanent access -VVS performed left basilic vein transposition (November 19) -patient accepted HD on 06/17/12   Diabetes mellitus type 2 -Continue NovoLog sliding scale -Hemoglobin A1c 5.9 -LDL is not at goal--104 -Change to Lipitor 40 mg Anemia of CKD -May need Procrit/Aranesp History of CVA -Continue aspirin     Family Communication:   Pt at beside Disposition Plan:   Home when medically stable   Procedures:  Left basilic vein transposition    Procedures/Studies: Dg Chest 2 View  06/11/2012  *RADIOLOGY REPORT*  Clinical Data: Shortness of breath worsening over the past week. Right chest pressure.  CHEST - 2 VIEW  Comparison: Chest radiograph 08/23/2007  Findings: Two views of the chest demonstrate mild interstitial prominence in the lower lungs.  There is blunting at the left costophrenic angles suggesting volume loss and cannot exclude a small effusion.  Lateral view demonstrates a small area of consolidation involving either the right lower lobe or the posterior right middle lobe.  Linear density in the left mid lung suggestive for subsegmental atelectasis.  Heart size appears stable.  Trachea is midline.  IMPRESSION: Interstitial prominence and patchy densities at the lung bases.  A small area of consolidation at the right base as  described. Findings could represent a combination of mild edema and volume loss.  An infectious etiology cannot be excluded, particularly at the right lung base.  Recommend follow-up to ensure resolution.   Original Report Authenticated By: Richarda Overlie, M.D.          Subjective: Ready to start HD   Objective: Filed Vitals:   06/17/12 1615 06/17/12 1620 06/17/12 1625 06/17/12 1726  BP: 148/87 144/84 140/74 138/77  Pulse: 79 70 70 69  Temp:    98 F (36.7 C)  TempSrc:    Oral  Resp: 18 16 16 18   Height:      Weight:      SpO2: 100% 100% 100% 100%    Intake/Output Summary (Last 24 hours) at 06/17/12 1823 Last data filed at 06/17/12 1509  Gross per 24 hour  Intake    363 ml  Output    825 ml  Net   -462 ml   Weight change: 0.051 kg (1.8 oz) Exam:   General:  Pt is alert, follows commands appropriately, not in acute distress  HEENT: No icterus, No thrush, No neck mass, [place]/AT  Cardiovascular: RRR, S1/S2, no rubs, no gallops  Respiratory: Bibasilar crackles. No wheezes or rhonchi  Abdomen: Soft/+BS, non tender, non distended, no guarding  Extremities: 2+ edema,  Data Reviewed: Basic Metabolic Panel:  Lab 06/17/12 8119 06/16/12 1125 06/16/12 0625 06/15/12 0500 06/14/12 0510 06/13/12 0524 06/12/12 0520  NA 134* 136 -- 134* 135 137 --  K 4.3 3.9 -- 4.0 4.1 4.3 --  CL 95* 99 -- 99 99 101 --  CO2 22 21 -- 21 21 20  --  GLUCOSE 200* 172* -- 190* 167* 106* --  BUN  91* 90* -- 88* 86* 76* --  CREATININE 12.24* 11.45* -- 10.53* 10.28* 9.79* --  CALCIUM 8.2* 7.8* -- 7.6* 7.7* 7.9* --  MG -- -- -- -- -- -- 1.9  PHOS 6.8* -- 5.8* 5.6* 6.0* 5.2* --   Liver Function Tests:  Lab 06/17/12 0435 06/12/12 0520  AST -- 20  ALT -- 18  ALKPHOS -- 70  BILITOT -- 0.2*  PROT -- 6.4  ALBUMIN 2.5* 2.5*   No results found for this basename: LIPASE:5,AMYLASE:5 in the last 168 hours No results found for this basename: AMMONIA:5 in the last 168 hours CBC:  Lab 06/13/12 0524  06/12/12 0520 06/11/12 0926 06/11/12 0902  WBC 6.0 5.7 -- 5.8  NEUTROABS -- -- -- 3.5  HGB 8.9* 9.2* 10.5* 10.1*  HCT 27.3* 28.0* 31.0* 30.8*  MCV 82.7 83.1 -- 83.7  PLT 251 257 -- 277   Cardiac Enzymes: No results found for this basename: CKTOTAL:5,CKMB:5,CKMBINDEX:5,TROPONINI:5 in the last 168 hours BNP: No components found with this basename: POCBNP:5 CBG:  Lab 06/17/12 1709 06/17/12 1136 06/17/12 0751 06/16/12 2053 06/16/12 1702  GLUCAP 146* 204* 159* 168* 163*    Recent Results (from the past 240 hour(s))  MRSA PCR SCREENING     Status: Normal   Collection Time   06/11/12  8:45 PM      Component Value Range Status Comment   MRSA by PCR NEGATIVE  NEGATIVE Final   SURGICAL PCR SCREEN     Status: Normal   Collection Time   06/14/12  4:25 AM      Component Value Range Status Comment   MRSA, PCR NEGATIVE  NEGATIVE Final    Staphylococcus aureus NEGATIVE  NEGATIVE Final      Scheduled Meds:    . amLODipine  10 mg Oral Daily  . aspirin EC  81 mg Oral Daily  . atorvastatin  40 mg Oral q1800  . brinzolamide  1 drop Right Eye BID   And  . brimonidine  1 drop Right Eye BID  . carvedilol  25 mg Oral BID WC  . [COMPLETED]  ceFAZolin (ANCEF) IV  2 g Intravenous On Call  . cloNIDine  0.1 mg Oral TID  . furosemide  160 mg Intravenous Q6H  . heparin  5,000 Units Subcutaneous Q8H  . hydrALAZINE  100 mg Oral Q8H  . insulin aspart  0-15 Units Subcutaneous TID WC  . insulin aspart  0-5 Units Subcutaneous QHS  . isosorbide mononitrate  60 mg Oral Daily  . sodium chloride  3 mL Intravenous Q12H  . Travoprost (BAK Free)  1 drop Both Eyes QHS   Continuous Infusions:    Broden Holt, MD  [facility] Pager [phone]  If 7PM-7AM, please contact night-coverage [email or web address] Password [facility] 06/17/2012, 6:23 PM   LOS: 6 days

## 2012-06-18 LAB — CBC
MCH: 27.6 pg (ref 26.0–34.0)
Platelets: 258 10*3/uL (ref 150–400)
RBC: 3.4 MIL/uL — ABNORMAL LOW (ref 4.22–5.81)
WBC: 6.6 10*3/uL (ref 4.0–10.5)

## 2012-06-18 LAB — RENAL FUNCTION PANEL
CO2: 24 mEq/L (ref 19–32)
Calcium: 8.1 mg/dL — ABNORMAL LOW (ref 8.4–10.5)
GFR calc Af Amer: 5 mL/min — ABNORMAL LOW (ref >90)
GFR calc non Af Amer: 4 mL/min — ABNORMAL LOW (ref >90)
Sodium: 137 mEq/L (ref 135–145)

## 2012-06-18 LAB — GLUCOSE, CAPILLARY
Glucose-Capillary: 186 mg/dL — ABNORMAL HIGH (ref 70–99)
Glucose-Capillary: 144 mg/dL — ABNORMAL HIGH (ref 70–99)

## 2012-06-18 MED ORDER — SODIUM CHLORIDE 0.9 % IV SOLN: 100.0000 mL | INTRAVENOUS | Status: DC | PRN

## 2012-06-18 MED ORDER — NEPRO/CARBSTEADY PO LIQD: 237.0000 mL | ORAL | Status: DC | PRN

## 2012-06-18 MED ORDER — ALTEPLASE 2 MG IJ SOLR: 2.0000 mg | Freq: Once | INTRAMUSCULAR | Status: DC | PRN

## 2012-06-18 MED ORDER — DARBEPOETIN ALFA-POLYSORBATE 100 MCG/0.5ML IJ SOLN
100.0000 ug | INTRAMUSCULAR | Status: DC
  Administered 2012-06-18: 100 ug via INTRAVENOUS
  Filled 2012-06-18: qty 0.5

## 2012-06-18 MED ORDER — CALCIUM ACETATE 667 MG PO CAPS
1334.0000 mg | ORAL_CAPSULE | Freq: Three times a day (TID) | ORAL | Status: DC
  Administered 2012-06-18 – 2012-06-21 (×8): 1334 mg via ORAL
  Filled 2012-06-18 (×12): qty 2

## 2012-06-18 MED ORDER — LIDOCAINE-PRILOCAINE 2.5-2.5 % EX CREA: 1.0000 "application " | TOPICAL_CREAM | CUTANEOUS | Status: DC | PRN

## 2012-06-18 MED ORDER — GLUCOSE 40 % PO GEL: 1.0000 | ORAL | Status: DC | PRN

## 2012-06-18 MED ORDER — HEPARIN SODIUM (PORCINE) 1000 UNIT/ML DIALYSIS: 1000.0000 [IU] | INTRAMUSCULAR | Status: DC | PRN

## 2012-06-18 MED ORDER — PENTAFLUOROPROP-TETRAFLUOROETH EX AERO: 1.0000 "application " | INHALATION_SPRAY | CUTANEOUS | Status: DC | PRN

## 2012-06-18 MED ORDER — HEPARIN SODIUM (PORCINE) 1000 UNIT/ML DIALYSIS: 2000.0000 [IU] | INTRAMUSCULAR | Status: DC | PRN

## 2012-06-18 MED ORDER — LIDOCAINE HCL (PF) 1 % IJ SOLN: 5.0000 mL | INTRAMUSCULAR | Status: DC | PRN

## 2012-06-18 MED ORDER — HEPARIN SODIUM (PORCINE) 1000 UNIT/ML DIALYSIS: 2500.0000 [IU] | INTRAMUSCULAR | Status: DC | PRN

## 2012-06-18 MED ORDER — DARBEPOETIN ALFA-POLYSORBATE 100 MCG/0.5ML IJ SOLN
INTRAMUSCULAR | Status: AC
  Administered 2012-06-18: 100 ug via INTRAVENOUS
  Filled 2012-06-18: qty 0.5

## 2012-06-18 NOTE — Progress Notes (Signed)
[facility] PROGRESS NOTE  Oscar Castillo ZOX:[id number] DOB: 05-May-1951 DOA: 06/11/2012 PCP: Oliver Barre, MD  Assessment/Plan: Malignant hypertension Patient was admitted initially to a stepdown unit and was started on iv Nitroglycerin  -Moved out of step down (November 19), weaned off nitro drip -Continued on  amlodipine, Coreg, clonidine, hydralazine, Imdur. Patient started on HD 06/17/12 may need adjustment of meds once on HD   Acute systolic and diastolic heart failure -Ejection fraction 45% -Neg 6075cc for the admission with aggressive diuresis -Last chest x-ray on 06/11/2001 revealed interstitial prominence and patchy densities in the bases - we stopped lasix once we started HD.    CKD stage V -Appreciate nephrology input -VVS performed left basilic vein transposition (November 19) - perm cath placed 06/17/12 by IR -patient accepted HD on 06/17/12   Diabetes mellitus type 2 -Continue NovoLog sliding scale -Hemoglobin A1c 5.9 -LDL is not at goal--104 -Change to Lipitor 40 mg  Anemia of CKD -May need Procrit/Aranesp History of CVA -Continue aspirin     Family Communication:   Patient and wife at beside Disposition Plan:   Home when medically stable   Procedures:  Left basilic vein transposition    Procedures/Studies: Dg Chest 2 View  06/11/2012  *RADIOLOGY REPORT*  Clinical Data: Shortness of breath worsening over the past week. Right chest pressure.  CHEST - 2 VIEW  Comparison: Chest radiograph 08/23/2007  Findings: Two views of the chest demonstrate mild interstitial prominence in the lower lungs.  There is blunting at the left costophrenic angles suggesting volume loss and cannot exclude a small effusion.  Lateral view demonstrates a small area of consolidation involving either the right lower lobe or the posterior right middle lobe.  Linear density in the left mid lung suggestive for subsegmental atelectasis.  Heart size appears stable.  Trachea is  midline.  IMPRESSION: Interstitial prominence and patchy densities at the lung bases.  A small area of consolidation at the right base as described. Findings could represent a combination of mild edema and volume loss.  An infectious etiology cannot be excluded, particularly at the right lung base.  Recommend follow-up to ensure resolution.   Original Report Authenticated By: Richarda Overlie, M.D.          Subjective: Feels better with HD  Objective: Filed Vitals:   06/18/12 1225 06/18/12 1300 06/18/12 1330 06/18/12 1400  BP: 164/81 150/83 159/82 145/83  Pulse: 80 77 76 76  Temp:      TempSrc:      Resp: 18 18 18 19   Height:      Weight:      SpO2:        Intake/Output Summary (Last 24 hours) at 06/18/12 1432 Last data filed at 06/18/12 1052  Gross per 24 hour  Intake 1697.67 ml  Output   1800 ml  Net -102.33 ml   Weight change: -5.3 kg (-11 lb 11 oz) Exam:   General:  Pt is alert, follows commands appropriately, not in acute distress  HEENT: No icterus, No thrush, No neck mass, [place]/AT  Cardiovascular: RRR, S1/S2, no rubs, no gallops  Respiratory: Bibasilar crackles. No wheezes or rhonchi  Abdomen: Soft/+BS, non tender, non distended, no guarding  Extremities: 2+ edema,  Data Reviewed: Basic Metabolic Panel:  Lab 06/18/12 8119 06/17/12 0435 06/16/12 1125 06/16/12 0625 06/15/12 0500 06/14/12 0510 06/12/12 0520  NA 137 134* 136 -- 134* 135 --  K 3.7 4.3 3.9 -- 4.0 4.1 --  CL 98 95* 99 -- 99  99 --  CO2 24 22 21  -- 21 21 --  GLUCOSE 136* 200* 172* -- 190* 167* --  BUN 75* 91* 90* -- 88* 86* --  CREATININE 11.18* 12.24* 11.45* -- 10.53* 10.28* --  CALCIUM 8.1* 8.2* 7.8* -- 7.6* 7.7* --  MG -- -- -- -- -- -- 1.9  PHOS 6.5* 6.8* -- 5.8* 5.6* 6.0* --   Liver Function Tests:  Lab 06/18/12 1230 06/17/12 0435 06/12/12 0520  AST -- -- 20  ALT -- -- 18  ALKPHOS -- -- 70  BILITOT -- -- 0.2*  PROT -- -- 6.4  ALBUMIN 2.3* 2.5* 2.5*   No results found for this  basename: LIPASE:5,AMYLASE:5 in the last 168 hours No results found for this basename: AMMONIA:5 in the last 168 hours CBC:  Lab 06/18/12 1230 06/13/12 0524 06/12/12 0520  WBC 6.6 6.0 5.7  NEUTROABS -- -- --  HGB 9.4* 8.9* 9.2*  HCT 28.4* 27.3* 28.0*  MCV 83.5 82.7 83.1  PLT 258 251 257   Cardiac Enzymes: No results found for this basename: CKTOTAL:5,CKMB:5,CKMBINDEX:5,TROPONINI:5 in the last 168 hours BNP: No components found with this basename: POCBNP:5 CBG:  Lab 06/18/12 0747 06/17/12 2122 06/17/12 1709 06/17/12 1136 06/17/12 0751  GLUCAP 186* 145* 146* 204* 159*    Recent Results (from the past 240 hour(s))  MRSA PCR SCREENING     Status: Normal   Collection Time   06/11/12  8:45 PM      Component Value Range Status Comment   MRSA by PCR NEGATIVE  NEGATIVE Final   SURGICAL PCR SCREEN     Status: Normal   Collection Time   06/14/12  4:25 AM      Component Value Range Status Comment   MRSA, PCR NEGATIVE  NEGATIVE Final    Staphylococcus aureus NEGATIVE  NEGATIVE Final      Scheduled Meds:    . amLODipine  10 mg Oral Daily  . aspirin EC  81 mg Oral Daily  . atorvastatin  40 mg Oral q1800  . brinzolamide  1 drop Right Eye BID   And  . brimonidine  1 drop Right Eye BID  . calcium acetate  1,334 mg Oral TID WC  . carvedilol  25 mg Oral BID WC  . [COMPLETED]  ceFAZolin (ANCEF) IV  2 g Intravenous On Call  . cloNIDine  0.1 mg Oral TID  . darbepoetin (ARANESP) injection - DIALYSIS  100 mcg Intravenous Q Sat-HD  . heparin  5,000 Units Subcutaneous Q8H  . hydrALAZINE  100 mg Oral Q8H  . insulin aspart  0-15 Units Subcutaneous TID WC  . insulin aspart  0-5 Units Subcutaneous QHS  . isosorbide mononitrate  60 mg Oral Daily  . sodium chloride  3 mL Intravenous Q12H  . Travoprost (BAK Free)  1 drop Both Eyes QHS  . [DISCONTINUED] furosemide  160 mg Intravenous Q6H   Continuous Infusions:    Crystel Demarco, MD  [facility] Pager [phone]  If 7PM-7AM, please  contact night-coverage [email or web address] Password [email or web address] 06/18/2012, 2:32 PM   LOS: 7 days

## 2012-06-18 NOTE — Progress Notes (Signed)
Patient ID: Oscar Castillo, male   DOB: Jan 19, 1951, 61 y.o.   MRN: [id number]   [facility] Progress Note    Subjective:   The patient reported to have tolerated dialysis well yesterday. Some bleeding noted around new catheter insertion. Currently has been controlled by dressing/pressure.    Objective:   BP 169/88  Pulse 74  Temp 99.2 F (37.3 C) (Oral)  Resp 20  Ht 5\' 10"  (1.778 m)  Wt 109.5 kg (241 lb 6.5 oz)  BMI 34.64 kg/m2  SpO2 95%  Physical Exam: Gen: Comfortably sleeping in bed, wife by bedside CVS: Pulse regular in rate and rhythm, heart sounds S1 and S2 normal Resp: Diminished breath sounds over the bases otherwise clear to auscultation Abd: Soft, obese, nontender and bowel sounds are normal. Ext: 2+ edema over lower extremities, good thrill over the left upper arm arteriovenous fistula.  Labs: BMET  Lab 06/17/12 0435 06/16/12 1125 06/16/12 0625 06/15/12 0500 06/14/12 0510 06/13/12 0524 06/12/12 0520  NA 134* 136 -- 134* 135 137 141  K 4.3 3.9 -- 4.0 4.1 4.3 3.9  CL 95* 99 -- 99 99 101 107  CO2 22 21 -- 21 21 20 20   GLUCOSE 200* 172* -- 190* 167* 106* 54*  BUN 91* 90* -- 88* 86* 76* 71*  CREATININE 12.24* 11.45* -- 10.53* 10.28* 9.79* 9.42*  ALB -- -- -- -- -- -- --  CALCIUM 8.2* 7.8* -- 7.6* 7.7* 7.9* 8.4  PHOS 6.8* -- 5.8* 5.6* 6.0* 5.2* 4.9*   CBC  Lab 06/13/12 0524 06/12/12 0520  WBC 6.0 5.7  NEUTROABS -- --  HGB 8.9* 9.2*  HCT 27.3* 28.0*  MCV 82.7 83.1  PLT 251 257     Medications:      . amLODipine  10 mg Oral Daily  . aspirin EC  81 mg Oral Daily  . atorvastatin  40 mg Oral q1800  . brinzolamide  1 drop Right Eye BID   And  . brimonidine  1 drop Right Eye BID  . carvedilol  25 mg Oral BID WC  . [COMPLETED]  ceFAZolin (ANCEF) IV  2 g Intravenous On Call  . cloNIDine  0.1 mg Oral TID  . heparin  5,000 Units Subcutaneous Q8H  . hydrALAZINE  100 mg Oral Q8H  . insulin aspart  0-15 Units Subcutaneous TID WC  . insulin  aspart  0-5 Units Subcutaneous QHS  . isosorbide mononitrate  60 mg Oral Daily  . sodium chloride  3 mL Intravenous Q12H  . Travoprost (BAK Free)  1 drop Both Eyes QHS  . [DISCONTINUED] furosemide  160 mg Intravenous Q6H     Assessment/ Plan:   1. Hypertensive urgency- overall improving blood pressure control, anticipate will improve further with ultrafiltration on hemodialysis may prompt decrease in antihypertensive therapy dose.  2. New ESRD. Tolerated hemodialysis yesterday without problems, plan for repeat dialysis again today. We'll decide on need for dialysis again tomorrow versus Monday given staffing issues over the weekend.   3. CHF-  furosemide has been discontinued, continue efforts at ultrafiltration on hemodialysis for volume management. 4. DM- per Primary svc  5. Anemia of chronic disease- iron stores are replete, I have started the patient on Aranesp given his hemoglobin declined-the acute component is likely to be from catheter associated losses.   6. Metabolic bone disease: Started patient on calcium acetate 3 times a day a.c. for phosphorus binding. We'll check an intact parathyroid hormone level today, he was previously on  oral Zemplar 1 mcg daily.     Zetta Bills, MD 06/18/2012, 9:49 AM

## 2012-06-18 NOTE — Progress Notes (Signed)
Pt with small amount of bright red bloody drainage to dressing of Right IJ HD cath. Drainage marked. Pt also with complaint of new onset left flank pain s/p dialysis. Pain medication given, will continue to monitor.

## 2012-06-18 NOTE — Progress Notes (Signed)
Pt with increased amount of bright red drainage around HD cath. HD RN on unit examined and redressed site. Will continue to monitor. Pt states pain relieved with medication.

## 2012-06-19 LAB — CBC
Hemoglobin: 9.5 g/dL — ABNORMAL LOW (ref 13.0–17.0)
MCH: 27.8 pg (ref 26.0–34.0)
MCHC: 33.3 g/dL (ref 30.0–36.0)
Platelets: 265 10*3/uL (ref 150–400)

## 2012-06-19 LAB — GLUCOSE, CAPILLARY: Glucose-Capillary: 199 mg/dL — ABNORMAL HIGH (ref 70–99)

## 2012-06-19 LAB — RENAL FUNCTION PANEL
Calcium: 8.5 mg/dL (ref 8.4–10.5)
GFR calc Af Amer: 6 mL/min — ABNORMAL LOW (ref >90)
Glucose, Bld: 195 mg/dL — ABNORMAL HIGH (ref 70–99)
Phosphorus: 6.1 mg/dL — ABNORMAL HIGH (ref 2.3–4.6)
Sodium: 134 mEq/L — ABNORMAL LOW (ref 135–145)

## 2012-06-19 MED ORDER — DARBEPOETIN ALFA-POLYSORBATE 100 MCG/0.5ML IJ SOLN
INTRAMUSCULAR | Status: AC
  Administered 2012-06-19: 100 ug
  Filled 2012-06-19: qty 0.5

## 2012-06-19 MED ORDER — DARBEPOETIN ALFA-POLYSORBATE 100 MCG/0.5ML IJ SOLN: 100.0000 ug | Freq: Once | INTRAMUSCULAR | Status: DC

## 2012-06-19 NOTE — Progress Notes (Signed)
Gave additional dose aranesp this am , Dr. Allena Katz notified and stated there is no problem with this and gave order for the extra dose of aranesp that was given this AM

## 2012-06-19 NOTE — Progress Notes (Signed)
Patient ID: Oscar Castillo, male   DOB: 05/26/1951, 61 y.o.   MRN: [id number]   [facility] Progress Note    Subjective:   Reports some nausea overnight- otherwise still feeling tired and without emerging complaints   Objective:   BP 163/85  Pulse 78  Temp 98.4 F (36.9 C) (Oral)  Resp 14  Ht 5\' 10"  (1.778 m)  Wt 105.8 kg (233 lb 4 oz)  BMI 33.47 kg/m2  SpO2 98%  Physical Exam: JYN:[id number] on HD VHQ:IONGE RRR, normal S1 and S2  Resp:Coarse BS bilaterally, no rales/rhonchi XBM:WUXL, obese, NT, BS normal Ext:1-2+LE edema  Labs: BMET  Lab 06/19/12 0714 06/18/12 1230 06/17/12 0435 06/16/12 1125 06/16/12 0625 06/15/12 0500 06/14/12 0510 06/13/12 0524  NA 134* 137 134* 136 -- 134* 135 137  K 3.7 3.7 4.3 3.9 -- 4.0 4.1 4.3  CL 94* 98 95* 99 -- 99 99 101  CO2 26 24 22 21  -- 21 21 20   GLUCOSE 195* 136* 200* 172* -- 190* 167* 106*  BUN 56* 75* 91* 90* -- 88* 86* 76*  CREATININE 9.86* 11.18* 12.24* 11.45* -- 10.53* 10.28* 9.79*  ALB -- -- -- -- -- -- -- --  CALCIUM 8.5 8.1* 8.2* 7.8* -- 7.6* 7.7* 7.9*  PHOS 6.1* 6.5* 6.8* -- 5.8* 5.6* 6.0* 5.2*   CBC  Lab 06/19/12 0713 06/18/12 1230 06/13/12 0524  WBC 7.5 6.6 6.0  NEUTROABS -- -- --  HGB 9.5* 9.4* 8.9*  HCT 28.5* 28.4* 27.3*  MCV 83.3 83.5 82.7  PLT 265 258 251    Medications:      . amLODipine  10 mg Oral Daily  . aspirin EC  81 mg Oral Daily  . atorvastatin  40 mg Oral q1800  . brinzolamide  1 drop Right Eye BID   And  . brimonidine  1 drop Right Eye BID  . calcium acetate  1,334 mg Oral TID WC  . carvedilol  25 mg Oral BID WC  . cloNIDine  0.1 mg Oral TID  . [COMPLETED] darbepoetin      . darbepoetin (ARANESP) injection - DIALYSIS  100 mcg Intravenous Q Sat-HD  . heparin  5,000 Units Subcutaneous Q8H  . hydrALAZINE  100 mg Oral Q8H  . insulin aspart  0-15 Units Subcutaneous TID WC  . insulin aspart  0-5 Units Subcutaneous QHS  . isosorbide mononitrate  60 mg Oral Daily  . sodium  chloride  3 mL Intravenous Q12H  . Travoprost (BAK Free)  1 drop Both Eyes QHS  . [DISCONTINUED] furosemide  160 mg Intravenous Q6H     Assessment/ Plan:   1. Hypertensive urgency- overall improving blood pressure control, continue to monitor with UF and monitor for dose adjustment.  2. New ESRD. Third consequent HD treatment today. Plan for next HD Tuesday (CLIP process started for OP HD unit placement) 3. CHF- compensated- anticipate to improve with UF.  4. DM- per Primary svc  5. Anemia of chronic disease- iron stores are replete, I have started the patient on Aranesp given his hemoglobin declined-the acute component is likely to be from catheter associated losses.  6. Metabolic bone disease: Started patient on calcium acetate 3 times a day a.c. for phosphorus binding. PTH level pending, he was previously on oral Zemplar 1 mcg daily.    Zetta Bills, MD 06/19/2012, 9:43 AM

## 2012-06-19 NOTE — Procedures (Signed)
Patient seen on Hemodialysis. QB 300, UF goal 3500 Treatment adjusted as needed.  Zetta Bills MD [facility]. Office # [phone] Pager # [phone] 9:40 AM

## 2012-06-19 NOTE — Progress Notes (Signed)
[facility] PROGRESS NOTE  Oscar Castillo XBJ:[id number] DOB: October 08, 1950 DOA: 06/11/2012 PCP: Oliver Barre, MD  Assessment/Plan: Malignant hypertension Patient was admitted initially to a stepdown unit and was started on iv Nitroglycerin  -Moved out of step down (November 19), weaned off nitro drip -Continued on  amlodipine, Coreg, clonidine, hydralazine, Imdur. Patient started on HD 06/17/12 may need adjustment of meds once on HD   Acute systolic and diastolic heart failure -Ejection fraction 45% -Neg 6075cc for the admission with aggressive diuresis -Last chest x-ray on 06/11/2001 revealed interstitial prominence and patchy densities in the bases - we stopped lasix once we started HD.    CKD stage V -Appreciate nephrology input -VVS performed left basilic vein transposition (November 19) - perm cath placed 06/17/12 by IR -patient accepted HD on 06/17/12   Diabetes mellitus type 2 -Continue NovoLog sliding scale -Hemoglobin A1c 5.9 -LDL is not at goal--104 -Change to Lipitor 40 mg  Anemia of CKD -May need Procrit/Aranesp History of CVA -Continue aspirin     Family Communication:   Patient  Disposition Plan:   Home when medically stable   Procedures:  Left basilic vein transposition    Procedures/Studies: Dg Chest 2 View  06/11/2012  *RADIOLOGY REPORT*  Clinical Data: Shortness of breath worsening over the past week. Right chest pressure.  CHEST - 2 VIEW  Comparison: Chest radiograph 08/23/2007  Findings: Two views of the chest demonstrate mild interstitial prominence in the lower lungs.  There is blunting at the left costophrenic angles suggesting volume loss and cannot exclude a small effusion.  Lateral view demonstrates a small area of consolidation involving either the right lower lobe or the posterior right middle lobe.  Linear density in the left mid lung suggestive for subsegmental atelectasis.  Heart size appears stable.  Trachea is midline.   IMPRESSION: Interstitial prominence and patchy densities at the lung bases.  A small area of consolidation at the right base as described. Findings could represent a combination of mild edema and volume loss.  An infectious etiology cannot be excluded, particularly at the right lung base.  Recommend follow-up to ensure resolution.   Original Report Authenticated By: Richarda Overlie, M.D.          Subjective: Having HD treatment - has no complains   Objective: Filed Vitals:   06/19/12 0645 06/19/12 0703 06/19/12 0730 06/19/12 0800  BP: 160/86  165/84 156/77  Pulse: 75 72 72 77  Temp: 98.4 F (36.9 C)     TempSrc: Oral     Resp: 15 16 18 17   Height:      Weight: 105.8 kg (233 lb 4 oz)     SpO2: 96% 98% 96% 96%   Patient Vitals for the past 24 hrs:  BP Temp Temp src Pulse Resp SpO2 Weight  06/19/12 1040 168/77 mmHg 99.7 F (37.6 C) Oral 79  18  97 % -  06/19/12 1010 162/80 mmHg 99.8 F (37.7 C) Oral 79  19  98 % 102.7 kg (226 lb 6.6 oz)  06/19/12 0930 163/85 mmHg - - 78  14  98 % -  06/19/12 0900 173/82 mmHg - - 76  15  98 % -  06/19/12 0830 163/84 mmHg - - 73  18  98 % -  06/19/12 0800 156/77 mmHg - - 77  17  96 % -  06/19/12 0730 165/84 mmHg - - 72  18  96 % -  06/19/12 0703 - - - 72  16  98 % -  06/19/12 0645 160/86 mmHg 98.4 F (36.9 C) Oral 75  15  96 % 105.8 kg (233 lb 4 oz)  06/18/12 2149 139/77 mmHg 98.7 F (37.1 C) Oral 80  18  96 % 105.4 kg (232 lb 5.8 oz)  06/18/12 1800 139/71 mmHg 97.8 F (36.6 C) Oral 76  18  98 % -  06/18/12 1536 161/81 mmHg 98.6 F (37 C) Oral 85  16  99 % -  06/18/12 1458 143/74 mmHg - - 77  14  98 % 105.7 kg (233 lb 0.4 oz)  06/18/12 1430 143/66 mmHg - - 78  20  - -  06/18/12 1400 145/83 mmHg - - 76  19  - -  06/18/12 1330 159/82 mmHg - - 76  18  - -  06/18/12 1300 150/83 mmHg - - 77  18  - -  06/18/12 1225 164/81 mmHg - - 80  18  - -  06/18/12 1215 163/91 mmHg 99.1 F (37.3 C) Oral 81  17  97 % 107.5 kg (236 lb 15.9 oz)        Intake/Output Summary (Last 24 hours) at 06/19/12 0843 Last data filed at 06/18/12 2215  Gross per 24 hour  Intake    360 ml  Output   2337 ml  Net  -1977 ml   Weight change: -3.1 kg (-6 lb 13.4 oz) Exam:   General:  Pt is alert, follows commands appropriately, not in acute distress  HEENT: No icterus, No thrush, No neck mass, [place]/AT  Cardiovascular: RRR, S1/S2, no rubs, no gallops  Respiratory: Bibasilar crackles. No wheezes or rhonchi  Abdomen: Soft/+BS, non tender, non distended, no guarding  Extremities: 2+ edema,  Data Reviewed: Basic Metabolic Panel:  Lab 06/19/12 2956 06/18/12 1230 06/17/12 0435 06/16/12 1125 06/16/12 0625 06/15/12 0500  NA 134* 137 134* 136 -- 134*  K 3.7 3.7 4.3 3.9 -- 4.0  CL 94* 98 95* 99 -- 99  CO2 26 24 22 21  -- 21  GLUCOSE 195* 136* 200* 172* -- 190*  BUN 56* 75* 91* 90* -- 88*  CREATININE 9.86* 11.18* 12.24* 11.45* -- 10.53*  CALCIUM 8.5 8.1* 8.2* 7.8* -- 7.6*  MG -- -- -- -- -- --  PHOS 6.1* 6.5* 6.8* -- 5.8* 5.6*   Liver Function Tests:  Lab 06/19/12 0714 06/18/12 1230 06/17/12 0435  AST -- -- --  ALT -- -- --  ALKPHOS -- -- --  BILITOT -- -- --  PROT -- -- --  ALBUMIN 2.4* 2.3* 2.5*   No results found for this basename: LIPASE:5,AMYLASE:5 in the last 168 hours No results found for this basename: AMMONIA:5 in the last 168 hours CBC:  Lab 06/19/12 0713 06/18/12 1230 06/13/12 0524  WBC 7.5 6.6 6.0  NEUTROABS -- -- --  HGB 9.5* 9.4* 8.9*  HCT 28.5* 28.4* 27.3*  MCV 83.3 83.5 82.7  PLT 265 258 251   Cardiac Enzymes: No results found for this basename: CKTOTAL:5,CKMB:5,CKMBINDEX:5,TROPONINI:5 in the last 168 hours BNP: No components found with this basename: POCBNP:5 CBG:  Lab 06/18/12 2145 06/18/12 1803 06/18/12 1649 06/18/12 1535 06/18/12 0747  GLUCAP 186* 144* 135* 126* 186*    Recent Results (from the past 240 hour(s))  MRSA PCR SCREENING     Status: Normal   Collection Time   06/11/12  8:45 PM       Component Value Range Status Comment   MRSA by PCR NEGATIVE  NEGATIVE Final   SURGICAL PCR SCREEN  Status: Normal   Collection Time   06/14/12  4:25 AM      Component Value Range Status Comment   MRSA, PCR NEGATIVE  NEGATIVE Final    Staphylococcus aureus NEGATIVE  NEGATIVE Final      Scheduled Meds:    . amLODipine  10 mg Oral Daily  . aspirin EC  81 mg Oral Daily  . atorvastatin  40 mg Oral q1800  . brinzolamide  1 drop Right Eye BID   And  . brimonidine  1 drop Right Eye BID  . calcium acetate  1,334 mg Oral TID WC  . carvedilol  25 mg Oral BID WC  . cloNIDine  0.1 mg Oral TID  . [COMPLETED] darbepoetin      . darbepoetin (ARANESP) injection - DIALYSIS  100 mcg Intravenous Q Sat-HD  . heparin  5,000 Units Subcutaneous Q8H  . hydrALAZINE  100 mg Oral Q8H  . insulin aspart  0-15 Units Subcutaneous TID WC  . insulin aspart  0-5 Units Subcutaneous QHS  . isosorbide mononitrate  60 mg Oral Daily  . sodium chloride  3 mL Intravenous Q12H  . Travoprost (BAK Free)  1 drop Both Eyes QHS  . [DISCONTINUED] furosemide  160 mg Intravenous Q6H   Continuous Infusions:    Lenis Nettleton, MD  [facility] Pager [phone]  If 7PM-7AM, please contact night-coverage [email or web address] Password [facility] 06/19/2012, 8:43 AM   LOS: 8 days

## 2012-06-20 LAB — PARATHYROID HORMONE, INTACT (NO CA): PTH: 693.8 pg/mL — ABNORMAL HIGH (ref 14.0–72.0)

## 2012-06-20 LAB — GLUCOSE, CAPILLARY
Glucose-Capillary: 194 mg/dL — ABNORMAL HIGH (ref 70–99)
Glucose-Capillary: 213 mg/dL — ABNORMAL HIGH (ref 70–99)

## 2012-06-20 MED FILL — Gelatin Absorbable Sponge 12-7 MM: CUTANEOUS | Qty: 1 | Status: AC

## 2012-06-20 MED FILL — Heparin Sodium (Porcine) Inj 1000 Unit/ML: INTRAMUSCULAR | Qty: 10 | Status: AC

## 2012-06-20 NOTE — Progress Notes (Signed)
Inpatient Diabetes Program Recommendations  AACE/ADA: New Consensus Statement on Inpatient Glycemic Control (2013)  Target Ranges:  Prepandial:   less than 140 mg/dL      Peak postprandial:   less than 180 mg/dL (1-2 hours)      Critically ill patients:  140 - 180 mg/dL    Results for KALIF, KENTNER (MRN [id number]) as of 06/20/2012 12:18  Ref. Range 06/19/2012 10:34 06/19/2012 13:02 06/19/2012 17:18 06/19/2012 21:30  Glucose-Capillary Latest Range: 70-99 mg/dL 409 (H) 811 (H) 914 (H) 199 (H)   Results for SHAKIEL, COZINE (MRN [id number]) as of 06/20/2012 12:18  Ref. Range 06/20/2012 08:01 06/20/2012 12:00  Glucose-Capillary Latest Range: 70-99 mg/dL 086 (H) 578 (H)     Inpatient Diabetes Program Recommendations Insulin - Basal: Please add a portion of patient's home Lantus dose- Lantus 5 units QHS. Correction (SSI): Currently on Novolog Moderate SSI tid ac + HS. Insulin - Meal Coverage: Please add meal coverage- Novolog 3 units tid with meals.  Note: Will follow. Ambrose Finland RN, MSN, CDE Diabetes Coordinator Inpatient Diabetes Program [phone]

## 2012-06-20 NOTE — Evaluation (Signed)
Physical Therapy Evaluation Patient Details Name: Oscar Castillo MRN: [id number] DOB: Oct 20, 1950 Today's Date: 06/20/2012 Time: 4098-1191 PT Time Calculation (min): 17 min  PT Assessment / Plan / Recommendation Clinical Impression  Pt adm with ESRD and HD initiated.  Needs skilled PT to maximize I and safety so pt can return home with wife.    PT Assessment  Patient needs continued PT services    Follow Up Recommendations  Home health PT    Does the patient have the potential to tolerate intense rehabilitation      Barriers to Discharge        Equipment Recommendations  Rolling walker with 5" wheels    Recommendations for Other Services     Frequency Min 3X/week    Precautions / Restrictions Precautions Precautions: Fall   Pertinent Vitals/Pain N/A      Mobility  Bed Mobility Bed Mobility: Supine to Sit;Sit to Supine;Sitting - Scoot to Edge of Bed Supine to Sit: 4: Min assist;HOB flat Sitting - Scoot to [place] of Bed: 5: Supervision Sit to Supine: HOB flat;5: Supervision Details for Bed Mobility Assistance: Assist to bring trunk up Transfers Transfers: Sit to Stand;Stand to Sit Sit to Stand: 4: Min assist;With upper extremity assist;From bed Stand to Sit: 4: Min assist;With upper extremity assist;To bed Details for Transfer Assistance: Verbal cues for hand placement Ambulation/Gait Ambulation/Gait Assistance: 4: Min assist Ambulation Distance (Feet): 200 Feet Assistive device: Rolling walker Ambulation/Gait Assistance Details: Verbal cues to stay closer to walker Gait Pattern: Step-through pattern;Decreased stride length;Trunk flexed    Shoulder Instructions     Exercises     PT Diagnosis: Difficulty walking;Generalized weakness  PT Problem List: Decreased strength;Decreased activity tolerance;Decreased balance;Decreased mobility;Decreased knowledge of use of DME PT Treatment Interventions: DME instruction;Gait training;Functional mobility  training;Patient/family education;Therapeutic activities;Therapeutic exercise;Balance training   PT Goals Acute Rehab PT Goals PT Goal Formulation: With patient Time For Goal Achievement: 07/04/12 Potential to Achieve Goals: Good Pt will go Supine/Side to Sit: with modified independence PT Goal: Supine/Side to Sit - Progress: Goal set today Pt will go Sit to Supine/Side: with modified independence PT Goal: Sit to Supine/Side - Progress: Goal set today Pt will go Sit to Stand: with modified independence PT Goal: Sit to Stand - Progress: Goal set today Pt will go Stand to Sit: with modified independence PT Goal: Stand to Sit - Progress: Goal set today Pt will Ambulate: >150 feet;with modified independence;with least restrictive assistive device PT Goal: Ambulate - Progress: Goal set today  Visit Information  Last PT Received On: 06/20/12 Assistance Needed: +1    Subjective Data  Subjective: Pt states he has just gotten back in the bed. Patient Stated Goal: Return home   Prior Functioning  Home Living Lives With: Spouse Available Help at Discharge: Family Type of Home: House Home Access: Stairs to enter Secretary/administrator of Steps: 1 Home Layout: One level Bathroom Shower/Tub: Network engineer: None Prior Function Level of Independence: Independent Able to Take Stairs?: Yes Driving: Yes Vocation: Retired Musician: No difficulties    Cognition  Overall Cognitive Status: Appears within functional limits for tasks assessed/performed Arousal/Alertness: Awake/alert Orientation Level: Appears intact for tasks assessed Behavior During Session: [facility] for tasks performed    Extremity/Trunk Assessment Right Lower Extremity Assessment RLE ROM/Strength/Tone: Deficits RLE ROM/Strength/Tone Deficits: grossly 4/5 Left Lower Extremity Assessment LLE ROM/Strength/Tone: Deficits LLE ROM/Strength/Tone Deficits:  grossly 4/5   Balance Static Standing Balance Static Standing - Balance Support: Bilateral upper  extremity supported (on walker) Static Standing - Level of Assistance: 5: Stand by assistance  End of Session PT - End of Session Equipment Utilized During Treatment: Gait belt Activity Tolerance: Patient tolerated treatment well Patient left: in bed;with call bell/phone within reach;with bed alarm set Nurse Communication: Mobility status  GP     Yalena Colon 06/20/2012, 3:30 PM  [facility] PT [phone]

## 2012-06-20 NOTE — Progress Notes (Signed)
[facility] PROGRESS NOTE  Oscar Castillo ZOX:[id number] DOB: 20-Apr-1951 DOA: 06/11/2012 PCP: Oliver Barre, MD  Assessment/Plan: Malignant hypertension Patient was admitted initially to a stepdown unit and was started on iv Nitroglycerin  -Moved out of step down (November 19), weaned off nitro drip -Continued on  amlodipine, Coreg, clonidine, hydralazine, Imdur. Patient started on HD 06/17/12 may need adjustment of meds once on HD   Acute systolic and diastolic heart failure -Ejection fraction 45% -Neg 6075cc for the admission with aggressive diuresis -Last chest x-ray on 06/11/2001 revealed interstitial prominence and patchy densities in the bases - we stopped lasix once we started HD.    CKD stage V -Appreciate nephrology input -VVS performed left basilic vein transposition (November 19) - perm cath placed 06/17/12 by IR -patient accepted HD on 06/17/12   Diabetes mellitus type 2 -Continue NovoLog sliding scale -Hemoglobin A1c 5.9 -LDL is not at goal--104 -Change to Lipitor 40 mg  Anemia of CKD -May need Procrit/Aranesp History of CVA -Continue aspirin     Family Communication:   Patient  Disposition Plan:   Home when medically stable   Procedures:  Left basilic vein transposition    Procedures/Studies: Dg Chest 2 View  06/11/2012  *RADIOLOGY REPORT*  Clinical Data: Shortness of breath worsening over the past week. Right chest pressure.  CHEST - 2 VIEW  Comparison: Chest radiograph 08/23/2007  Findings: Two views of the chest demonstrate mild interstitial prominence in the lower lungs.  There is blunting at the left costophrenic angles suggesting volume loss and cannot exclude a small effusion.  Lateral view demonstrates a small area of consolidation involving either the right lower lobe or the posterior right middle lobe.  Linear density in the left mid lung suggestive for subsegmental atelectasis.  Heart size appears stable.  Trachea is midline.   IMPRESSION: Interstitial prominence and patchy densities at the lung bases.  A small area of consolidation at the right base as described. Findings could represent a combination of mild edema and volume loss.  An infectious etiology cannot be excluded, particularly at the right lung base.  Recommend follow-up to ensure resolution.   Original Report Authenticated By: Richarda Overlie, M.D.          Subjective: In bed without much complains seems depressed about situation   Objective: Filed Vitals:   06/19/12 1500 06/19/12 1834 06/19/12 1956 06/20/12 0546  BP: 139/70 141/72 136/62 168/81  Pulse: 79 72 73 76  Temp: 99.2 F (37.3 C) 98.8 F (37.1 C) 98.9 F (37.2 C) 98.8 F (37.1 C)  TempSrc: Oral Oral Oral Oral  Resp: 17 18 18 18   Height:   5\' 10"  (1.778 m)   Weight:   104.872 kg (231 lb 3.2 oz)   SpO2: 98% 98% 97% 100%   Patient Vitals for the past 24 hrs:  BP Temp Temp src Pulse Resp SpO2 Height Weight  06/20/12 0546 168/81 mmHg 98.8 F (37.1 C) Oral 76  18  100 % - -  06/19/12 1956 136/62 mmHg 98.9 F (37.2 C) Oral 73  18  97 % 5\' 10"  (1.778 m) 104.872 kg (231 lb 3.2 oz)  06/19/12 1834 141/72 mmHg 98.8 F (37.1 C) Oral 72  18  98 % - -  06/19/12 1500 139/70 mmHg 99.2 F (37.3 C) Oral 79  17  98 % - -  06/19/12 1300 143/73 mmHg 100.2 F (37.9 C) Oral 85  - 100 % - -  06/19/12 1040 168/77 mmHg 99.7 F (37.6  C) Oral 79  18  97 % - -  06/19/12 1010 162/80 mmHg 99.8 F (37.7 C) Oral 79  19  98 % - 102.7 kg (226 lb 6.6 oz)  06/19/12 0930 163/85 mmHg - - 78  14  98 % - -  06/19/12 0900 173/82 mmHg - - 76  15  98 % - -       Intake/Output Summary (Last 24 hours) at 06/20/12 0852 Last data filed at 06/20/12 0547  Gross per 24 hour  Intake    720 ml  Output   3175 ml  Net  -2455 ml   Weight change: -4.8 kg (-10 lb 9.3 oz) Exam:   General:  Pt is alert, follows commands appropriately, not in acute distress  HEENT: No icterus, No thrush, No neck mass,  [place]/AT  Cardiovascular: RRR, S1/S2, no rubs, no gallops  Respiratory: Bibasilar crackles. No wheezes or rhonchi  Abdomen: Soft/+BS, non tender, non distended, no guarding  Extremities: 2+ edema,  Data Reviewed: Basic Metabolic Panel:  Lab 06/19/12 1610 06/18/12 1230 06/17/12 0435 06/16/12 1125 06/16/12 0625 06/15/12 0500  NA 134* 137 134* 136 -- 134*  K 3.7 3.7 4.3 3.9 -- 4.0  CL 94* 98 95* 99 -- 99  CO2 26 24 22 21  -- 21  GLUCOSE 195* 136* 200* 172* -- 190*  BUN 56* 75* 91* 90* -- 88*  CREATININE 9.86* 11.18* 12.24* 11.45* -- 10.53*  CALCIUM 8.5 8.1* 8.2* 7.8* -- 7.6*  MG -- -- -- -- -- --  PHOS 6.1* 6.5* 6.8* -- 5.8* 5.6*   Liver Function Tests:  Lab 06/19/12 0714 06/18/12 1230 06/17/12 0435  AST -- -- --  ALT -- -- --  ALKPHOS -- -- --  BILITOT -- -- --  PROT -- -- --  ALBUMIN 2.4* 2.3* 2.5*   No results found for this basename: LIPASE:5,AMYLASE:5 in the last 168 hours No results found for this basename: AMMONIA:5 in the last 168 hours CBC:  Lab 06/19/12 0713 06/18/12 1230  WBC 7.5 6.6  NEUTROABS -- --  HGB 9.5* 9.4*  HCT 28.5* 28.4*  MCV 83.3 83.5  PLT 265 258   Cardiac Enzymes: No results found for this basename: CKTOTAL:5,CKMB:5,CKMBINDEX:5,TROPONINI:5 in the last 168 hours BNP: No components found with this basename: POCBNP:5 CBG:  Lab 06/19/12 2130 06/19/12 1718 06/19/12 1302 06/19/12 1034 06/18/12 2145  GLUCAP 199* 234* 242* 127* 186*    Recent Results (from the past 240 hour(s))  MRSA PCR SCREENING     Status: Normal   Collection Time   06/11/12  8:45 PM      Component Value Range Status Comment   MRSA by PCR NEGATIVE  NEGATIVE Final   SURGICAL PCR SCREEN     Status: Normal   Collection Time   06/14/12  4:25 AM      Component Value Range Status Comment   MRSA, PCR NEGATIVE  NEGATIVE Final    Staphylococcus aureus NEGATIVE  NEGATIVE Final      Scheduled Meds:    . amLODipine  10 mg Oral Daily  . aspirin EC  81 mg Oral Daily  .  atorvastatin  40 mg Oral q1800  . brinzolamide  1 drop Right Eye BID   And  . brimonidine  1 drop Right Eye BID  . calcium acetate  1,334 mg Oral TID WC  . carvedilol  25 mg Oral BID WC  . cloNIDine  0.1 mg Oral TID  . darbepoetin (ARANESP) injection -  DIALYSIS  100 mcg Intravenous Q Sat-HD  . heparin  5,000 Units Subcutaneous Q8H  . hydrALAZINE  100 mg Oral Q8H  . insulin aspart  0-15 Units Subcutaneous TID WC  . insulin aspart  0-5 Units Subcutaneous QHS  . isosorbide mononitrate  60 mg Oral Daily  . sodium chloride  3 mL Intravenous Q12H  . Travoprost (BAK Free)  1 drop Both Eyes QHS  . [DISCONTINUED] darbepoetin (ARANESP) injection - DIALYSIS  100 mcg Intravenous Once   Continuous Infusions:    Oscar Gren, MD  [facility] Pager [phone]  If 7PM-7AM, please contact night-coverage [email or web address] Password [email or web address] 06/20/2012, 8:52 AM   LOS: 9 days

## 2012-06-20 NOTE — Progress Notes (Signed)
Patient ID: Oscar Castillo, male   DOB: April 26, 1951, 61 y.o.   MRN: [id number]   [facility] Progress Note    Subjective:   Reports some nausea overnight- otherwise still feeling tired and without emerging complaints.  Had HD yesterday removed 3000, tolerated well   Objective:   BP 147/62  Pulse 77  Temp 97.8 F (36.6 C) (Oral)  Resp 18  Ht 5\' 10"  (1.778 m)  Wt 104.872 kg (231 lb 3.2 oz)  BMI 33.17 kg/m2  SpO2 100%  Physical Exam: UXN:[id number] on HD eye matter on right eye KYH:CWCBJ RRR, normal S1 and S2  Resp:Coarse BS bilaterally, no rales/rhonchi SEG:BTDV, obese, NT, BS normal Ext:1-2+LE edema  Labs: BMET  Lab 06/19/12 0714 06/18/12 1230 06/17/12 0435 06/16/12 1125 06/16/12 0625 06/15/12 0500 06/14/12 0510  NA 134* 137 134* 136 -- 134* 135  K 3.7 3.7 4.3 3.9 -- 4.0 4.1  CL 94* 98 95* 99 -- 99 99  CO2 26 24 22 21  -- 21 21  GLUCOSE 195* 136* 200* 172* -- 190* 167*  BUN 56* 75* 91* 90* -- 88* 86*  CREATININE 9.86* 11.18* 12.24* 11.45* -- 10.53* 10.28*  ALB -- -- -- -- -- -- --  CALCIUM 8.5 8.1* 8.2* 7.8* -- 7.6* 7.7*  PHOS 6.1* 6.5* 6.8* -- 5.8* 5.6* 6.0*   CBC  Lab 06/19/12 0713 06/18/12 1230  WBC 7.5 6.6  NEUTROABS -- --  HGB 9.5* 9.4*  HCT 28.5* 28.4*  MCV 83.3 83.5  PLT 265 258    Medications:       . amLODipine  10 mg Oral Daily  . aspirin EC  81 mg Oral Daily  . atorvastatin  40 mg Oral q1800  . brinzolamide  1 drop Right Eye BID   And  . brimonidine  1 drop Right Eye BID  . calcium acetate  1,334 mg Oral TID WC  . carvedilol  25 mg Oral BID WC  . cloNIDine  0.1 mg Oral TID  . darbepoetin (ARANESP) injection - DIALYSIS  100 mcg Intravenous Q Sat-HD  . heparin  5,000 Units Subcutaneous Q8H  . hydrALAZINE  100 mg Oral Q8H  . insulin aspart  0-15 Units Subcutaneous TID WC  . insulin aspart  0-5 Units Subcutaneous QHS  . isosorbide mononitrate  60 mg Oral Daily  . sodium chloride  3 mL Intravenous Q12H  . Travoprost (BAK Free)   1 drop Both Eyes QHS  . [DISCONTINUED] darbepoetin (ARANESP) injection - DIALYSIS  100 mcg Intravenous Once     Assessment/ Plan:   1. Hypertensive urgency- overall improving blood pressure control, continue to monitor with UF and monitor for dose adjustment. Amlodipine/coreg/clonidine/hydralazine likely will be able to wean as volume status improves 2. New ESRD. Third consequent HD treatment Sunday. Plan for next HD tomoprrow (CLIP process started for OP HD unit placement) 3. CHF- compensated- anticipate to improve with UF.  4. DM- per Primary svc  5. Anemia of chronic disease- iron stores are replete, I have started the patient on Aranesp given his hemoglobin declined-the acute component is likely to be from catheter associated losses.  6. Metabolic bone disease: Started patient on calcium acetate 3 times a day a.c. for phosphorus binding. PTH level pending, he was previously on oral Zemplar 1 mcg daily.    Annie Sable, MD 06/20/2012, 11:21 AM

## 2012-06-21 LAB — CBC
MCH: 27.9 pg (ref 26.0–34.0)
MCHC: 32.7 g/dL (ref 30.0–36.0)
Platelets: 303 10*3/uL (ref 150–400)
RBC: 3.58 MIL/uL — ABNORMAL LOW (ref 4.22–5.81)

## 2012-06-21 LAB — RENAL FUNCTION PANEL
CO2: 25 mEq/L (ref 19–32)
Calcium: 9 mg/dL (ref 8.4–10.5)
GFR calc Af Amer: 6 mL/min — ABNORMAL LOW (ref >90)
GFR calc non Af Amer: 5 mL/min — ABNORMAL LOW (ref >90)
Phosphorus: 3.6 mg/dL (ref 2.3–4.6)
Sodium: 133 mEq/L — ABNORMAL LOW (ref 135–145)

## 2012-06-21 MED ORDER — CALCIUM ACETATE 667 MG PO CAPS: 1334.0000 mg | ORAL_CAPSULE | Freq: Three times a day (TID) | ORAL | Status: DC

## 2012-06-21 MED ORDER — PENTAFLUOROPROP-TETRAFLUOROETH EX AERO: 1.0000 "application " | INHALATION_SPRAY | CUTANEOUS | Status: DC | PRN

## 2012-06-21 MED ORDER — INSULIN GLARGINE 100 UNIT/ML ~~LOC~~ SOLN: 5.0000 [IU] | Freq: Every day | SUBCUTANEOUS | Status: DC

## 2012-06-21 MED ORDER — ALTEPLASE 2 MG IJ SOLR: 2.0000 mg | Freq: Once | INTRAMUSCULAR | Status: DC | PRN

## 2012-06-21 MED ORDER — ISOSORBIDE MONONITRATE ER 60 MG PO TB24: 60.0000 mg | ORAL_TABLET | Freq: Every day | ORAL | Status: DC

## 2012-06-21 MED ORDER — LIDOCAINE-PRILOCAINE 2.5-2.5 % EX CREA
1.0000 "application " | TOPICAL_CREAM | CUTANEOUS | Status: DC | PRN
  Filled 2012-06-21: qty 5

## 2012-06-21 MED ORDER — HEPARIN SODIUM (PORCINE) 1000 UNIT/ML DIALYSIS: 1000.0000 [IU] | INTRAMUSCULAR | Status: DC | PRN

## 2012-06-21 MED ORDER — SODIUM CHLORIDE 0.9 % IV SOLN: 100.0000 mL | INTRAVENOUS | Status: DC | PRN

## 2012-06-21 MED ORDER — NEPRO/CARBSTEADY PO LIQD
237.0000 mL | ORAL | Status: DC | PRN
  Filled 2012-06-21: qty 237

## 2012-06-21 MED ORDER — HEPARIN SODIUM (PORCINE) 1000 UNIT/ML DIALYSIS: 20.0000 [IU]/kg | INTRAMUSCULAR | Status: DC | PRN

## 2012-06-21 MED ORDER — PARICALCITOL 5 MCG/ML IV SOLN
INTRAVENOUS | Status: AC
  Administered 2012-06-21: 2 ug via INTRAVENOUS
  Filled 2012-06-21: qty 1

## 2012-06-21 MED ORDER — LIDOCAINE HCL (PF) 1 % IJ SOLN: 5.0000 mL | INTRAMUSCULAR | Status: DC | PRN

## 2012-06-21 MED ORDER — AMLODIPINE BESYLATE 5 MG PO TABS
5.0000 mg | ORAL_TABLET | Freq: Every day | ORAL | Status: DC
  Administered 2012-06-21: 5 mg via ORAL
  Filled 2012-06-21: qty 1

## 2012-06-21 MED ORDER — PARICALCITOL 5 MCG/ML IV SOLN
2.0000 ug | INTRAVENOUS | Status: DC
  Administered 2012-06-21: 2 ug via INTRAVENOUS

## 2012-06-21 NOTE — Procedures (Signed)
Patient was seen on dialysis and the procedure was supervised.  BFR 400  Via PC BP is  157/90.   Patient appears to be tolerating treatment well  Oscar Castillo A 06/21/2012

## 2012-06-21 NOTE — Discharge Summary (Signed)
Physician Discharge Summary  Oscar Castillo:[id number] DOB: 02-24-1951 DOA: 06/11/2012  PCP: Oliver Barre, MD  Admit date: 06/11/2012 Discharge date: 06/21/2012  Time spent: 40 minutes   Recommendations for Outpatient Follow-up:  Patient was started on dialysis   Discharge diagnosis ESRD - new onset HD  HTN (hypertension), malignant  DIABETES MELLITUS, TYPE II  Acute combined systolic and diastolic heart failure, NYHA class 2-EF 45%  HEPATITIS C, HX OF  CEREBROVASCULAR ACCIDENT, HX OF  Hyperlipidemia  CKD (chronic kidney disease) stage 5, GFR less than 15 ml/min  Anemia due to chronic illness  LVH (left ventricular hypertrophy)-severe concentric   Discharge Condition: good, able to tolerate regular diet   Diet recommendation: renal  Filed Weights   06/20/12 2009 06/21/12 0630 06/21/12 1020  Weight: 103.874 kg (229 lb) 104.3 kg (229 lb 15 oz) 99.7 kg (219 lb 12.8 oz)    History of present illness:  61 year old woman with a history of hypertension, diabetes type 2, CHF, and hyperlipidemia presents with three-week history of shortness of breath. The patient states the shortness breath has gotten significantly worse in last 24 hours which prompted emergency department visit. The patient was noted to have pulmonary edema as well as blood pressure of 203/113. He was started on nitroglycerin drip and given furosemide 120 mg IV. The patient diuresed 500 cc 4 hours later.  Of note, the patient has a known history of CKD and has been following Dr. Kathrene Bongo, but he has been resistant to dialysis. The patient has not had any surgeries for graft or fistula access. The patient states that he has run out of his hydralazine the last 24 hours. In addition, the patient states that he has been taking 2-4 Aleeve tablets daily for the past 3 days. He denies any fevers, chills, vomiting, diarrhea. He states that he has had good oral intake. Over the past 3 weeks, he has noted increasing lower  extremity edema as well as PND. He does not weigh himself. He states that he has been faithful with all his other antihypertensive medications. He complains of a nonproductive cough but denies any hemoptysis. He denies any dizziness, syncope, chest pain, nausea, abdominal pain, dysuria, rashes. He has not any headaches or visual changes. He denies any focal extremity weakness although he has chronic left lower extremity weakness from  previous stroke.    Hospital Course:  Malignant hypertension  Patient was admitted initially to a stepdown unit and was started on iv Nitroglycerin  -He was moved out of step down (November 19), weaned off nitro drip  -He was Continued on amlodipine, Coreg, clonidine, hydralazine, Imdur during hospitalization.  Patient started on HD 06/17/12   Acute systolic and diastolic heart failure  -Ejection fraction 45%  -Neg 6075cc for the admission with aggressive diuresis  -Last chest x-ray on 06/11/2001 revealed interstitial prominence and patchy densities in the bases  - we stopped lasix once we started HD.   CKD stage V  -Appreciate nephrology input  -VVS performed left basilic vein transposition (November 19)  - perm cath placed 06/17/12 by IR  -patient accepted HD on 06/17/12 and received treatment on 11/23,11/24 and 11/26 - he has secured a [facility] HD place at [facility]  Diabetes mellitus type 2  -was placed on  NovoLog sliding scale in hospital -Hemoglobin A1c 5.9  - plan for lantus 5 units qhs at home  dced glipizide   Anemia of CKD  -Procrit/Aranesp with Hd  History of CVA  -Continued on  aspirin      Procedures: HD  AVF creation  Perm cath placement   Consultations:  VVS  Nephrology     Discharge Exam: Filed Vitals:   06/21/12 1000 06/21/12 1020 06/21/12 1050 06/21/12 1256  BP: 155/87 149/86 148/78 146/79  Pulse: 77 75 78 80  Temp:  99.2 F (37.3 C) 99.2 F (37.3 C) 98.7 F (37.1 C)  TempSrc:  Oral    Resp:  15 16 17     Height:      Weight:  99.7 kg (219 lb 12.8 oz)    SpO2:  99% 100% 99%    General: axox3 Cardiovascular: rrr Respiratory: ctab   Discharge Instructions  Discharge Orders    Future Appointments: Provider: Department: Dept Phone: Center:   07/13/2012 9:00 AM [facility] [phone] [place]     Future Orders Please Complete By Expires   Diet renal [id number]      Increase activity slowly          Medication List     As of 06/21/2012  2:20 PM    STOP taking these medications         furosemide 40 MG tablet   Commonly known as: LASIX      glipiZIDE 10 MG tablet   Commonly known as: GLUCOTROL      TAKE these medications         amLODipine 10 MG tablet   Commonly known as: NORVASC   Take 1 tablet (10 mg total) by mouth daily.      aspirin 81 MG EC tablet   Take 81 mg by mouth daily.      calcium acetate 667 MG capsule   Commonly known as: PHOSLO   Take 2 capsules (1,334 mg total) by mouth 3 (three) times daily with meals.      cloNIDine 0.1 MG tablet   Commonly known as: CATAPRES   Take 1 tablet (0.1 mg total) by mouth 2 (two) times daily.      hydrALAZINE 100 MG tablet   Commonly known as: APRESOLINE   Take 1 tablet (100 mg total) by mouth 3 (three) times daily.      insulin glargine 100 UNIT/ML injection   Commonly known as: LANTUS   Inject 5 Units into the skin at bedtime.      isosorbide mononitrate 60 MG 24 hr tablet   Commonly known as: IMDUR   Take 1 tablet (60 mg total) by mouth daily.      labetalol 300 MG tablet   Commonly known as: NORMODYNE   Take 1 tablet (300 mg total) by mouth 2 (two) times daily.      pravastatin 20 MG tablet   Commonly known as: PRAVACHOL   Take one (1) tablet(s) once daily      SIMBRINZA 1-0.2 % Susp   Generic drug: Brinzolamide-Brimonidine   Place 1 drop into the right eye 2 (two) times daily.      traMADol 50 MG tablet   Commonly known as: ULTRAM   Take 50 mg by  mouth 4 (four) times daily as needed. For pain      Travoprost (BAK Free) 0.004 % Soln ophthalmic solution   Commonly known as: TRAVATAN   Place 1 drop into both eyes at bedtime.           Follow-up Information    Follow up with Oliver Barre, MD.   Contact information:   [address] [place] [address] [phone]           The results of significant diagnostics from this hospitalization (including imaging, microbiology, ancillary and laboratory) are listed below for reference.    Significant Diagnostic Studies: Dg Chest 2 View  06/11/2012  *RADIOLOGY REPORT*  Clinical Data: Shortness of breath worsening over the past week. Right chest pressure.  CHEST - 2 VIEW  Comparison: Chest radiograph 08/23/2007  Findings: Two views of the chest demonstrate mild interstitial prominence in the lower lungs.  There is blunting at the left costophrenic angles suggesting volume loss and cannot exclude a small effusion.  Lateral view demonstrates a small area of consolidation involving either the right lower lobe or the posterior right middle lobe.  Linear density in the left mid lung suggestive for subsegmental atelectasis.  Heart size appears stable.  Trachea is midline.  IMPRESSION: Interstitial prominence and patchy densities at the lung bases.  A small area of consolidation at the right base as described. Findings could represent a combination of mild edema and volume loss.  An infectious etiology cannot be excluded, particularly at the right lung base.  Recommend follow-up to ensure resolution.   Original Report Authenticated By: Richarda Overlie, M.D.    Ir Fluoro Guide Cv Line Right  06/17/2012  *RADIOLOGY REPORT*  Clinical Data:  End-stage renal disease, no current access  ULTRASOUND GUIDANCE FOR VASCULAR ACCESS RIGHT INTERNAL JUGULAR PERMANENT HEMODIALYSIS CATHETER  Date:  06/17/2012 15:45:00  Radiologist:  Judie Petit. Ruel Favors, M.D.  Medications:  1 gram ancefadministered  within 1 hour of the procedure,1 mg Versed, 50 mcg Fentanyl  Guidance:  Ultrasound fluoroscopic  Fluoroscopy time:  0.4 minutes  Sedation time:  15 minutes  Contrast volume:  None.  Complications:  No immediate  PROCEDURE/FINDINGS:  Informed consent was obtained from the patient following explanation of the procedure, risks, benefits and alternatives. The patient understands, agrees and consents for the procedure. All questions were addressed.  A time out was performed.  Maximal barrier sterile technique utilized including caps, mask, sterile gowns, sterile gloves, large sterile drape, hand hygiene, and 2% chlorhexidine scrub.  Under sterile conditions and local anesthesia, right internal jugular micropuncture venous access was performed with ultrasound. Images were obtained for documentation.  A guide wire was inserted followed by a transitional dilator.  Next, a 0.035 guidewire was advanced into the IVC with a 5-French catheter.  Measurements were obtained from the right venotomy site to the proximal right atrium. In the right infraclavicular chest, a subcutaneous tunnel was created under sterile conditions and local anesthesia.  1% lidocaine with epinephrine was utilized for this.  The 23 cm tip to cuff HemoSplit catheter was tunneled subcutaneously to the venotomy site and inserted into the SVC/RA junction through a valved peel- away sheath.  Position was confirmed with fluoroscopy.  Images were obtained for documentation.  Blood was aspirated from the catheter followed by saline and heparin flushes.  The appropriate volume and strength of heparin was instilled in each lumen.  Caps were applied.  The catheter was secured at the tunnel site with Gelfoam and a pursestring suture.  The venotomy site was closed with subcuticular Vicryl suture.  Dermabond was applied to the small right neck incision.  A dry sterile dressing was applied.  The catheter is ready for use.  No immediate complications.  IMPRESSION:  Ultrasound and fluoroscopically guided right internal jugular tunneled hemodialysis catheter (23 cm tip to cuff HemoSplit catheter).   Original Report Authenticated  By: Osvaldo Shipper, M.D.    Ir US Guide Vasc Access Right  06/17/2012  *RADIOLOGY REPORT*  Clinical Data:  End-stage renal disease, no current access  ULTRASOUND GUIDANCE FOR VASCULAR ACCESS RIGHT INTERNAL JUGULAR PERMANENT HEMODIALYSIS CATHETER  Date:  06/17/2012 15:45:00  Radiologist:  Judie Petit. Ruel Favors, M.D.  Medications:  1 gram ancefadministered within 1 hour of the procedure,1 mg Versed, 50 mcg Fentanyl  Guidance:  Ultrasound fluoroscopic  Fluoroscopy time:  0.4 minutes  Sedation time:  15 minutes  Contrast volume:  None.  Complications:  No immediate  PROCEDURE/FINDINGS:  Informed consent was obtained from the patient following explanation of the procedure, risks, benefits and alternatives. The patient understands, agrees and consents for the procedure. All questions were addressed.  A time out was performed.  Maximal barrier sterile technique utilized including caps, mask, sterile gowns, sterile gloves, large sterile drape, hand hygiene, and 2% chlorhexidine scrub.  Under sterile conditions and local anesthesia, right internal jugular micropuncture venous access was performed with ultrasound. Images were obtained for documentation.  A guide wire was inserted followed by a transitional dilator.  Next, a 0.035 guidewire was advanced into the IVC with a 5-French catheter.  Measurements were obtained from the right venotomy site to the proximal right atrium. In the right infraclavicular chest, a subcutaneous tunnel was created under sterile conditions and local anesthesia.  1% lidocaine with epinephrine was utilized for this.  The 23 cm tip to cuff HemoSplit catheter was tunneled subcutaneously to the venotomy site and inserted into the SVC/RA junction through a valved peel- away sheath.  Position was confirmed with fluoroscopy.  Images were obtained for  documentation.  Blood was aspirated from the catheter followed by saline and heparin flushes.  The appropriate volume and strength of heparin was instilled in each lumen.  Caps were applied.  The catheter was secured at the tunnel site with Gelfoam and a pursestring suture.  The venotomy site was closed with subcuticular Vicryl suture.  Dermabond was applied to the small right neck incision.  A dry sterile dressing was applied.  The catheter is ready for use.  No immediate complications.  IMPRESSION: Ultrasound and fluoroscopically guided right internal jugular tunneled hemodialysis catheter (23 cm tip to cuff HemoSplit catheter).   Original Report Authenticated By: Judie Petit. Miles Costain, M.D.     Microbiology: Recent Results (from the past 240 hour(s))  MRSA PCR SCREENING     Status: Normal   Collection Time   06/11/12  8:45 PM      Component Value Range Status Comment   MRSA by PCR NEGATIVE  NEGATIVE Final   SURGICAL PCR SCREEN     Status: Normal   Collection Time   06/14/12  4:25 AM      Component Value Range Status Comment   MRSA, PCR NEGATIVE  NEGATIVE Final    Staphylococcus aureus NEGATIVE  NEGATIVE Final      Labs: Basic Metabolic Panel:  Lab 06/21/12 1610 06/19/12 0714 06/18/12 1230 06/17/12 0435 06/16/12 1125 06/16/12 0625  NA 133* 134* 137 134* 136 --  K 3.8 3.7 3.7 4.3 3.9 --  CL 94* 94* 98 95* 99 --  CO2 25 26 24 22 21  --  GLUCOSE 219* 195* 136* 200* 172* --  BUN 47* 56* 75* 91* 90* --  CREATININE 9.21* 9.86* 11.18* 12.24* 11.45* --  CALCIUM 9.0 8.5 8.1* 8.2* 7.8* --  MG -- -- -- -- -- --  PHOS 3.6 6.1* 6.5* 6.8* -- 5.8*   Liver Function  Tests:  Lab 06/21/12 0624 06/19/12 0714 06/18/12 1230 06/17/12 0435  AST -- -- -- --  ALT -- -- -- --  ALKPHOS -- -- -- --  BILITOT -- -- -- --  PROT -- -- -- --  ALBUMIN 2.5* 2.4* 2.3* 2.5*   No results found for this basename: LIPASE:5,AMYLASE:5 in the last 168 hours No results found for this basename: AMMONIA:5 in the last 168  hours CBC:  Lab 06/21/12 0624 06/19/12 0713 06/18/12 1230  WBC 7.3 7.5 6.6  NEUTROABS -- -- --  HGB 10.0* 9.5* 9.4*  HCT 30.6* 28.5* 28.4*  MCV 85.5 83.3 83.5  PLT 303 265 258   Cardiac Enzymes: No results found for this basename: CKTOTAL:5,CKMB:5,CKMBINDEX:5,TROPONINI:5 in the last 168 hours BNP: BNP (last 3 results)  Basename 06/11/12 0916  PROBNP 6937.0*   CBG:  Lab 06/21/12 1158 06/21/12 1049 06/20/12 2135 06/20/12 1646 06/20/12 1200  GLUCAP 177* 145* 213* 237* 203*       Signed:  Kalyani Maeda  [facility] 06/21/2012, 2:20 PM

## 2012-06-21 NOTE — Progress Notes (Signed)
Patient ID: Oscar Castillo, male   DOB: Apr 03, 1951, 61 y.o.   MRN: [id number]   [facility] Progress Note    Subjective:   Seen on HD, no particular complaints.     Objective:   BP 163/88  Pulse 77  Temp 98.3 F (36.8 C) (Oral)  Resp 18  Ht 5\' 10"  (1.778 m)  Wt 104.3 kg (229 lb 15 oz)  BMI 32.99 kg/m2  SpO2 97%  Physical Exam: [id number] on HD eye matter on right eye IHK:[id number] RRR, normal S1 and S2  Resp:Coarse BS bilaterally, no rales/rhonchi DGL:OVFI, obese, NT, BS normal Ext:1 +LE edema  Labs: BMET  Lab 06/21/12 0624 06/19/12 0714 06/18/12 1230 06/17/12 0435 06/16/12 1125 06/16/12 0625 06/15/12 0500  NA 133* 134* 137 134* 136 -- 134*  K 3.8 3.7 3.7 4.3 3.9 -- 4.0  CL 94* 94* 98 95* 99 -- 99  CO2 25 26 24 22 21  -- 21  GLUCOSE 219* 195* 136* 200* 172* -- 190*  BUN 47* 56* 75* 91* 90* -- 88*  CREATININE 9.21* 9.86* 11.18* 12.24* 11.45* -- 10.53*  ALB -- -- -- -- -- -- --  CALCIUM 9.0 8.5 8.1* 8.2* 7.8* -- 7.6*  PHOS 3.6 6.1* 6.5* 6.8* -- 5.8* 5.6*   CBC  Lab 06/21/12 0624 06/19/12 0713 06/18/12 1230  WBC 7.3 7.5 6.6  NEUTROABS -- -- --  HGB 10.0* 9.5* 9.4*  HCT 30.6* 28.5* 28.4*  MCV 85.5 83.3 83.5  PLT 303 265 258    Medications:       . amLODipine  10 mg Oral Daily  . aspirin EC  81 mg Oral Daily  . atorvastatin  40 mg Oral q1800  . brinzolamide  1 drop Right Eye BID   And  . brimonidine  1 drop Right Eye BID  . calcium acetate  1,334 mg Oral TID WC  . carvedilol  25 mg Oral BID WC  . cloNIDine  0.1 mg Oral TID  . darbepoetin (ARANESP) injection - DIALYSIS  100 mcg Intravenous Q Sat-HD  . heparin  5,000 Units Subcutaneous Q8H  . hydrALAZINE  100 mg Oral Q8H  . insulin aspart  0-15 Units Subcutaneous TID WC  . insulin aspart  0-5 Units Subcutaneous QHS  . isosorbide mononitrate  60 mg Oral Daily  . sodium chloride  3 mL Intravenous Q12H  . Travoprost (BAK Free)  1 drop Both Eyes QHS     Assessment/ Plan: 61 year old BM  with advanced CKD now ESRD  1. Hypertensive urgency- overall improving blood pressure control, continue to monitor with UF and monitor for dose adjustment. Amlodipine/coreg/clonidine/hydralazine likely will be able to wean as volume status improves.  Actually today I will d/c hydralazine and decrease norvasc since we are pulling 4 liters with HD 2. New ESRD. Today is fourth HD treatment. (CLIP process started for OP HD unit placement) Would hope we would hear something soon ? 3. CHF- compensated- anticipate to continue to improve with UF.  4. DM- per Primary svc  5. Anemia of chronic disease- iron stores are replete, I have started the patient on Aranesp given his hemoglobin declined-the acute component is likely to be from catheter associated losses.  6. Metabolic bone disease: Started patient on calcium acetate 3 times a day a.c. for phosphorus binding. PTH level over 600, he was previously on oral Zemplar 1 mcg daily. Will start IV zemplar with HD   Annie Sable, MD 06/21/2012, 9:26 AM

## 2012-06-22 ENCOUNTER — Other Ambulatory Visit: Payer: Self-pay | Admitting: Internal Medicine

## 2012-06-23 DIAGNOSIS — D509 Iron deficiency anemia, unspecified: Secondary | ICD-10-CM | POA: Diagnosis not present

## 2012-06-23 DIAGNOSIS — N2581 Secondary hyperparathyroidism of renal origin: Secondary | ICD-10-CM | POA: Diagnosis not present

## 2012-06-23 DIAGNOSIS — N186 End stage renal disease: Secondary | ICD-10-CM | POA: Diagnosis not present

## 2012-06-23 DIAGNOSIS — N039 Chronic nephritic syndrome with unspecified morphologic changes: Secondary | ICD-10-CM | POA: Diagnosis not present

## 2012-06-25 DIAGNOSIS — N186 End stage renal disease: Secondary | ICD-10-CM | POA: Diagnosis not present

## 2012-06-25 DIAGNOSIS — D631 Anemia in chronic kidney disease: Secondary | ICD-10-CM | POA: Diagnosis not present

## 2012-06-25 DIAGNOSIS — N2581 Secondary hyperparathyroidism of renal origin: Secondary | ICD-10-CM | POA: Diagnosis not present

## 2012-06-25 DIAGNOSIS — D509 Iron deficiency anemia, unspecified: Secondary | ICD-10-CM | POA: Diagnosis not present

## 2012-06-28 DIAGNOSIS — N2581 Secondary hyperparathyroidism of renal origin: Secondary | ICD-10-CM | POA: Diagnosis not present

## 2012-06-28 DIAGNOSIS — D631 Anemia in chronic kidney disease: Secondary | ICD-10-CM | POA: Diagnosis not present

## 2012-06-28 DIAGNOSIS — Z23 Encounter for immunization: Secondary | ICD-10-CM | POA: Diagnosis not present

## 2012-06-28 DIAGNOSIS — Z992 Dependence on renal dialysis: Secondary | ICD-10-CM | POA: Diagnosis not present

## 2012-06-28 DIAGNOSIS — N186 End stage renal disease: Secondary | ICD-10-CM | POA: Diagnosis not present

## 2012-06-28 DIAGNOSIS — D509 Iron deficiency anemia, unspecified: Secondary | ICD-10-CM | POA: Diagnosis not present

## 2012-06-28 DIAGNOSIS — N039 Chronic nephritic syndrome with unspecified morphologic changes: Secondary | ICD-10-CM | POA: Diagnosis not present

## 2012-06-30 DIAGNOSIS — D509 Iron deficiency anemia, unspecified: Secondary | ICD-10-CM | POA: Diagnosis not present

## 2012-06-30 DIAGNOSIS — Z992 Dependence on renal dialysis: Secondary | ICD-10-CM | POA: Diagnosis not present

## 2012-06-30 DIAGNOSIS — N2581 Secondary hyperparathyroidism of renal origin: Secondary | ICD-10-CM | POA: Diagnosis not present

## 2012-06-30 DIAGNOSIS — D631 Anemia in chronic kidney disease: Secondary | ICD-10-CM | POA: Diagnosis not present

## 2012-06-30 DIAGNOSIS — N186 End stage renal disease: Secondary | ICD-10-CM | POA: Diagnosis not present

## 2012-06-30 DIAGNOSIS — Z23 Encounter for immunization: Secondary | ICD-10-CM | POA: Diagnosis not present

## 2012-07-02 DIAGNOSIS — D631 Anemia in chronic kidney disease: Secondary | ICD-10-CM | POA: Diagnosis not present

## 2012-07-02 DIAGNOSIS — D509 Iron deficiency anemia, unspecified: Secondary | ICD-10-CM | POA: Diagnosis not present

## 2012-07-02 DIAGNOSIS — Z992 Dependence on renal dialysis: Secondary | ICD-10-CM | POA: Diagnosis not present

## 2012-07-02 DIAGNOSIS — N2581 Secondary hyperparathyroidism of renal origin: Secondary | ICD-10-CM | POA: Diagnosis not present

## 2012-07-02 DIAGNOSIS — N186 End stage renal disease: Secondary | ICD-10-CM | POA: Diagnosis not present

## 2012-07-02 DIAGNOSIS — Z23 Encounter for immunization: Secondary | ICD-10-CM | POA: Diagnosis not present

## 2012-07-05 DIAGNOSIS — Z23 Encounter for immunization: Secondary | ICD-10-CM | POA: Diagnosis not present

## 2012-07-05 DIAGNOSIS — Z992 Dependence on renal dialysis: Secondary | ICD-10-CM | POA: Diagnosis not present

## 2012-07-05 DIAGNOSIS — N186 End stage renal disease: Secondary | ICD-10-CM | POA: Diagnosis not present

## 2012-07-05 DIAGNOSIS — D509 Iron deficiency anemia, unspecified: Secondary | ICD-10-CM | POA: Diagnosis not present

## 2012-07-05 DIAGNOSIS — N2581 Secondary hyperparathyroidism of renal origin: Secondary | ICD-10-CM | POA: Diagnosis not present

## 2012-07-05 DIAGNOSIS — N039 Chronic nephritic syndrome with unspecified morphologic changes: Secondary | ICD-10-CM | POA: Diagnosis not present

## 2012-07-07 DIAGNOSIS — N039 Chronic nephritic syndrome with unspecified morphologic changes: Secondary | ICD-10-CM | POA: Diagnosis not present

## 2012-07-07 DIAGNOSIS — Z992 Dependence on renal dialysis: Secondary | ICD-10-CM | POA: Diagnosis not present

## 2012-07-07 DIAGNOSIS — D509 Iron deficiency anemia, unspecified: Secondary | ICD-10-CM | POA: Diagnosis not present

## 2012-07-07 DIAGNOSIS — Z23 Encounter for immunization: Secondary | ICD-10-CM | POA: Diagnosis not present

## 2012-07-07 DIAGNOSIS — N2581 Secondary hyperparathyroidism of renal origin: Secondary | ICD-10-CM | POA: Diagnosis not present

## 2012-07-07 DIAGNOSIS — E1129 Type 2 diabetes mellitus with other diabetic kidney complication: Secondary | ICD-10-CM | POA: Diagnosis not present

## 2012-07-07 DIAGNOSIS — N186 End stage renal disease: Secondary | ICD-10-CM | POA: Diagnosis not present

## 2012-07-09 DIAGNOSIS — Z23 Encounter for immunization: Secondary | ICD-10-CM | POA: Diagnosis not present

## 2012-07-09 DIAGNOSIS — Z992 Dependence on renal dialysis: Secondary | ICD-10-CM | POA: Diagnosis not present

## 2012-07-09 DIAGNOSIS — D631 Anemia in chronic kidney disease: Secondary | ICD-10-CM | POA: Diagnosis not present

## 2012-07-09 DIAGNOSIS — D509 Iron deficiency anemia, unspecified: Secondary | ICD-10-CM | POA: Diagnosis not present

## 2012-07-09 DIAGNOSIS — N186 End stage renal disease: Secondary | ICD-10-CM | POA: Diagnosis not present

## 2012-07-09 DIAGNOSIS — N2581 Secondary hyperparathyroidism of renal origin: Secondary | ICD-10-CM | POA: Diagnosis not present

## 2012-07-12 DIAGNOSIS — N2581 Secondary hyperparathyroidism of renal origin: Secondary | ICD-10-CM | POA: Diagnosis not present

## 2012-07-12 DIAGNOSIS — D509 Iron deficiency anemia, unspecified: Secondary | ICD-10-CM | POA: Diagnosis not present

## 2012-07-12 DIAGNOSIS — Z23 Encounter for immunization: Secondary | ICD-10-CM | POA: Diagnosis not present

## 2012-07-12 DIAGNOSIS — Z992 Dependence on renal dialysis: Secondary | ICD-10-CM | POA: Diagnosis not present

## 2012-07-12 DIAGNOSIS — D631 Anemia in chronic kidney disease: Secondary | ICD-10-CM | POA: Diagnosis not present

## 2012-07-12 DIAGNOSIS — N186 End stage renal disease: Secondary | ICD-10-CM | POA: Diagnosis not present

## 2012-07-13 ENCOUNTER — Ambulatory Visit (HOSPITAL_COMMUNITY): Payer: Medicare Other

## 2012-07-14 DIAGNOSIS — D509 Iron deficiency anemia, unspecified: Secondary | ICD-10-CM | POA: Diagnosis not present

## 2012-07-14 DIAGNOSIS — Z992 Dependence on renal dialysis: Secondary | ICD-10-CM | POA: Diagnosis not present

## 2012-07-14 DIAGNOSIS — Z23 Encounter for immunization: Secondary | ICD-10-CM | POA: Diagnosis not present

## 2012-07-14 DIAGNOSIS — D631 Anemia in chronic kidney disease: Secondary | ICD-10-CM | POA: Diagnosis not present

## 2012-07-14 DIAGNOSIS — N2581 Secondary hyperparathyroidism of renal origin: Secondary | ICD-10-CM | POA: Diagnosis not present

## 2012-07-14 DIAGNOSIS — N186 End stage renal disease: Secondary | ICD-10-CM | POA: Diagnosis not present

## 2012-07-16 DIAGNOSIS — D631 Anemia in chronic kidney disease: Secondary | ICD-10-CM | POA: Diagnosis not present

## 2012-07-16 DIAGNOSIS — Z23 Encounter for immunization: Secondary | ICD-10-CM | POA: Diagnosis not present

## 2012-07-16 DIAGNOSIS — Z992 Dependence on renal dialysis: Secondary | ICD-10-CM | POA: Diagnosis not present

## 2012-07-16 DIAGNOSIS — N186 End stage renal disease: Secondary | ICD-10-CM | POA: Diagnosis not present

## 2012-07-16 DIAGNOSIS — D509 Iron deficiency anemia, unspecified: Secondary | ICD-10-CM | POA: Diagnosis not present

## 2012-07-16 DIAGNOSIS — N2581 Secondary hyperparathyroidism of renal origin: Secondary | ICD-10-CM | POA: Diagnosis not present

## 2012-07-18 DIAGNOSIS — Z23 Encounter for immunization: Secondary | ICD-10-CM | POA: Diagnosis not present

## 2012-07-18 DIAGNOSIS — D509 Iron deficiency anemia, unspecified: Secondary | ICD-10-CM | POA: Diagnosis not present

## 2012-07-18 DIAGNOSIS — N2581 Secondary hyperparathyroidism of renal origin: Secondary | ICD-10-CM | POA: Diagnosis not present

## 2012-07-18 DIAGNOSIS — Z992 Dependence on renal dialysis: Secondary | ICD-10-CM | POA: Diagnosis not present

## 2012-07-18 DIAGNOSIS — N186 End stage renal disease: Secondary | ICD-10-CM | POA: Diagnosis not present

## 2012-07-18 DIAGNOSIS — D631 Anemia in chronic kidney disease: Secondary | ICD-10-CM | POA: Diagnosis not present

## 2012-07-19 ENCOUNTER — Ambulatory Visit (HOSPITAL_COMMUNITY)
Admission: RE | Admit: 2012-07-19 | Discharge: 2012-07-19 | Disposition: A | Discharge status: Home / Self Care | Payer: Medicare Other | Source: Ambulatory Visit | Attending: Urology | Admitting: Urology

## 2012-07-19 DIAGNOSIS — Q619 Cystic kidney disease, unspecified: Secondary | ICD-10-CM | POA: Insufficient documentation

## 2012-07-19 DIAGNOSIS — N281 Cyst of kidney, acquired: Secondary | ICD-10-CM

## 2012-07-21 DIAGNOSIS — N2581 Secondary hyperparathyroidism of renal origin: Secondary | ICD-10-CM | POA: Diagnosis not present

## 2012-07-21 DIAGNOSIS — Z23 Encounter for immunization: Secondary | ICD-10-CM | POA: Diagnosis not present

## 2012-07-21 DIAGNOSIS — D509 Iron deficiency anemia, unspecified: Secondary | ICD-10-CM | POA: Diagnosis not present

## 2012-07-21 DIAGNOSIS — N186 End stage renal disease: Secondary | ICD-10-CM | POA: Diagnosis not present

## 2012-07-21 DIAGNOSIS — N039 Chronic nephritic syndrome with unspecified morphologic changes: Secondary | ICD-10-CM | POA: Diagnosis not present

## 2012-07-21 DIAGNOSIS — Z992 Dependence on renal dialysis: Secondary | ICD-10-CM | POA: Diagnosis not present

## 2012-07-23 DIAGNOSIS — N039 Chronic nephritic syndrome with unspecified morphologic changes: Secondary | ICD-10-CM | POA: Diagnosis not present

## 2012-07-23 DIAGNOSIS — N186 End stage renal disease: Secondary | ICD-10-CM | POA: Diagnosis not present

## 2012-07-23 DIAGNOSIS — Z992 Dependence on renal dialysis: Secondary | ICD-10-CM | POA: Diagnosis not present

## 2012-07-23 DIAGNOSIS — N2581 Secondary hyperparathyroidism of renal origin: Secondary | ICD-10-CM | POA: Diagnosis not present

## 2012-07-23 DIAGNOSIS — D509 Iron deficiency anemia, unspecified: Secondary | ICD-10-CM | POA: Diagnosis not present

## 2012-07-23 DIAGNOSIS — Z23 Encounter for immunization: Secondary | ICD-10-CM | POA: Diagnosis not present

## 2012-07-25 ENCOUNTER — Ambulatory Visit (HOSPITAL_COMMUNITY): Payer: Medicare Other

## 2012-07-25 DIAGNOSIS — D509 Iron deficiency anemia, unspecified: Secondary | ICD-10-CM | POA: Diagnosis not present

## 2012-07-25 DIAGNOSIS — Z992 Dependence on renal dialysis: Secondary | ICD-10-CM | POA: Diagnosis not present

## 2012-07-25 DIAGNOSIS — Z23 Encounter for immunization: Secondary | ICD-10-CM | POA: Diagnosis not present

## 2012-07-25 DIAGNOSIS — N186 End stage renal disease: Secondary | ICD-10-CM | POA: Diagnosis not present

## 2012-07-25 DIAGNOSIS — D631 Anemia in chronic kidney disease: Secondary | ICD-10-CM | POA: Diagnosis not present

## 2012-07-25 DIAGNOSIS — N2581 Secondary hyperparathyroidism of renal origin: Secondary | ICD-10-CM | POA: Diagnosis not present

## 2012-07-26 DIAGNOSIS — N186 End stage renal disease: Secondary | ICD-10-CM | POA: Diagnosis not present

## 2012-07-27 DIAGNOSIS — K259 Gastric ulcer, unspecified as acute or chronic, without hemorrhage or perforation: Secondary | ICD-10-CM

## 2012-07-27 HISTORY — DX: Gastric ulcer, unspecified as acute or chronic, without hemorrhage or perforation: K25.9

## 2012-07-28 DIAGNOSIS — N186 End stage renal disease: Secondary | ICD-10-CM | POA: Diagnosis not present

## 2012-07-28 DIAGNOSIS — D509 Iron deficiency anemia, unspecified: Secondary | ICD-10-CM | POA: Diagnosis not present

## 2012-07-28 DIAGNOSIS — N2581 Secondary hyperparathyroidism of renal origin: Secondary | ICD-10-CM | POA: Diagnosis not present

## 2012-07-28 DIAGNOSIS — Z992 Dependence on renal dialysis: Secondary | ICD-10-CM | POA: Diagnosis not present

## 2012-08-17 DIAGNOSIS — N400 Enlarged prostate without lower urinary tract symptoms: Secondary | ICD-10-CM | POA: Diagnosis not present

## 2012-08-18 DIAGNOSIS — E1129 Type 2 diabetes mellitus with other diabetic kidney complication: Secondary | ICD-10-CM | POA: Diagnosis not present

## 2012-08-26 DIAGNOSIS — N186 End stage renal disease: Secondary | ICD-10-CM | POA: Diagnosis not present

## 2012-08-27 DIAGNOSIS — Z23 Encounter for immunization: Secondary | ICD-10-CM | POA: Diagnosis not present

## 2012-08-27 DIAGNOSIS — N2581 Secondary hyperparathyroidism of renal origin: Secondary | ICD-10-CM | POA: Diagnosis not present

## 2012-08-27 DIAGNOSIS — D509 Iron deficiency anemia, unspecified: Secondary | ICD-10-CM | POA: Diagnosis not present

## 2012-08-27 DIAGNOSIS — N186 End stage renal disease: Secondary | ICD-10-CM | POA: Diagnosis not present

## 2012-08-30 DIAGNOSIS — N2581 Secondary hyperparathyroidism of renal origin: Secondary | ICD-10-CM | POA: Diagnosis not present

## 2012-08-30 DIAGNOSIS — N186 End stage renal disease: Secondary | ICD-10-CM | POA: Diagnosis not present

## 2012-08-30 DIAGNOSIS — D509 Iron deficiency anemia, unspecified: Secondary | ICD-10-CM | POA: Diagnosis not present

## 2012-08-30 DIAGNOSIS — Z23 Encounter for immunization: Secondary | ICD-10-CM | POA: Diagnosis not present

## 2012-09-01 DIAGNOSIS — D509 Iron deficiency anemia, unspecified: Secondary | ICD-10-CM | POA: Diagnosis not present

## 2012-09-01 DIAGNOSIS — N186 End stage renal disease: Secondary | ICD-10-CM | POA: Diagnosis not present

## 2012-09-01 DIAGNOSIS — Z23 Encounter for immunization: Secondary | ICD-10-CM | POA: Diagnosis not present

## 2012-09-01 DIAGNOSIS — N2581 Secondary hyperparathyroidism of renal origin: Secondary | ICD-10-CM | POA: Diagnosis not present

## 2012-09-03 DIAGNOSIS — N186 End stage renal disease: Secondary | ICD-10-CM | POA: Diagnosis not present

## 2012-09-03 DIAGNOSIS — N2581 Secondary hyperparathyroidism of renal origin: Secondary | ICD-10-CM | POA: Diagnosis not present

## 2012-09-03 DIAGNOSIS — D509 Iron deficiency anemia, unspecified: Secondary | ICD-10-CM | POA: Diagnosis not present

## 2012-09-03 DIAGNOSIS — Z23 Encounter for immunization: Secondary | ICD-10-CM | POA: Diagnosis not present

## 2012-09-06 DIAGNOSIS — N186 End stage renal disease: Secondary | ICD-10-CM | POA: Diagnosis not present

## 2012-09-06 DIAGNOSIS — D509 Iron deficiency anemia, unspecified: Secondary | ICD-10-CM | POA: Diagnosis not present

## 2012-09-06 DIAGNOSIS — N2581 Secondary hyperparathyroidism of renal origin: Secondary | ICD-10-CM | POA: Diagnosis not present

## 2012-09-06 DIAGNOSIS — Z23 Encounter for immunization: Secondary | ICD-10-CM | POA: Diagnosis not present

## 2012-09-07 ENCOUNTER — Emergency Department (HOSPITAL_COMMUNITY): Payer: Medicare Other

## 2012-09-07 ENCOUNTER — Encounter (HOSPITAL_COMMUNITY): Payer: Self-pay | Admitting: *Deleted

## 2012-09-07 ENCOUNTER — Inpatient Hospital Stay (HOSPITAL_COMMUNITY)
Admission: EM | Admit: 2012-09-07 | Discharge: 2012-09-12 | DRG: 492 | Disposition: A | Discharge status: Rehabilitation Facility | Payer: Medicare Other | Attending: Internal Medicine | Admitting: Internal Medicine

## 2012-09-07 DIAGNOSIS — N400 Enlarged prostate without lower urinary tract symptoms: Secondary | ICD-10-CM

## 2012-09-07 DIAGNOSIS — N186 End stage renal disease: Secondary | ICD-10-CM | POA: Diagnosis not present

## 2012-09-07 DIAGNOSIS — F3289 Other specified depressive episodes: Secondary | ICD-10-CM

## 2012-09-07 DIAGNOSIS — N320 Bladder-neck obstruction: Secondary | ICD-10-CM

## 2012-09-07 DIAGNOSIS — E119 Type 2 diabetes mellitus without complications: Secondary | ICD-10-CM

## 2012-09-07 DIAGNOSIS — I5041 Acute combined systolic (congestive) and diastolic (congestive) heart failure: Secondary | ICD-10-CM

## 2012-09-07 DIAGNOSIS — I509 Heart failure, unspecified: Secondary | ICD-10-CM | POA: Diagnosis present

## 2012-09-07 DIAGNOSIS — F329 Major depressive disorder, single episode, unspecified: Secondary | ICD-10-CM

## 2012-09-07 DIAGNOSIS — E875 Hyperkalemia: Secondary | ICD-10-CM | POA: Diagnosis present

## 2012-09-07 DIAGNOSIS — E785 Hyperlipidemia, unspecified: Secondary | ICD-10-CM

## 2012-09-07 DIAGNOSIS — Z79899 Other long term (current) drug therapy: Secondary | ICD-10-CM

## 2012-09-07 DIAGNOSIS — Z8619 Personal history of other infectious and parasitic diseases: Secondary | ICD-10-CM

## 2012-09-07 DIAGNOSIS — S82209A Unspecified fracture of shaft of unspecified tibia, initial encounter for closed fracture: Principal | ICD-10-CM | POA: Diagnosis present

## 2012-09-07 DIAGNOSIS — F528 Other sexual dysfunction not due to a substance or known physiological condition: Secondary | ICD-10-CM

## 2012-09-07 DIAGNOSIS — I120 Hypertensive chronic kidney disease with stage 5 chronic kidney disease or end stage renal disease: Secondary | ICD-10-CM | POA: Diagnosis present

## 2012-09-07 DIAGNOSIS — R935 Abnormal findings on diagnostic imaging of other abdominal regions, including retroperitoneum: Secondary | ICD-10-CM

## 2012-09-07 DIAGNOSIS — K5900 Constipation, unspecified: Secondary | ICD-10-CM | POA: Diagnosis present

## 2012-09-07 DIAGNOSIS — I10 Essential (primary) hypertension: Secondary | ICD-10-CM

## 2012-09-07 DIAGNOSIS — Z01818 Encounter for other preprocedural examination: Secondary | ICD-10-CM | POA: Diagnosis not present

## 2012-09-07 DIAGNOSIS — Z7982 Long term (current) use of aspirin: Secondary | ICD-10-CM

## 2012-09-07 DIAGNOSIS — I5043 Acute on chronic combined systolic (congestive) and diastolic (congestive) heart failure: Secondary | ICD-10-CM | POA: Diagnosis present

## 2012-09-07 DIAGNOSIS — R296 Repeated falls: Secondary | ICD-10-CM | POA: Diagnosis present

## 2012-09-07 DIAGNOSIS — B182 Chronic viral hepatitis C: Secondary | ICD-10-CM | POA: Diagnosis present

## 2012-09-07 DIAGNOSIS — Z8679 Personal history of other diseases of the circulatory system: Secondary | ICD-10-CM

## 2012-09-07 DIAGNOSIS — Z8673 Personal history of transient ischemic attack (TIA), and cerebral infarction without residual deficits: Secondary | ICD-10-CM

## 2012-09-07 DIAGNOSIS — Z87442 Personal history of urinary calculi: Secondary | ICD-10-CM

## 2012-09-07 DIAGNOSIS — E1122 Type 2 diabetes mellitus with diabetic chronic kidney disease: Secondary | ICD-10-CM | POA: Diagnosis present

## 2012-09-07 DIAGNOSIS — Z992 Dependence on renal dialysis: Secondary | ICD-10-CM

## 2012-09-07 DIAGNOSIS — K8020 Calculus of gallbladder without cholecystitis without obstruction: Secondary | ICD-10-CM

## 2012-09-07 DIAGNOSIS — D649 Anemia, unspecified: Secondary | ICD-10-CM | POA: Diagnosis present

## 2012-09-07 DIAGNOSIS — D638 Anemia in other chronic diseases classified elsewhere: Secondary | ICD-10-CM

## 2012-09-07 DIAGNOSIS — K219 Gastro-esophageal reflux disease without esophagitis: Secondary | ICD-10-CM

## 2012-09-07 DIAGNOSIS — S82202A Unspecified fracture of shaft of left tibia, initial encounter for closed fracture: Secondary | ICD-10-CM

## 2012-09-07 DIAGNOSIS — S82899A Other fracture of unspecified lower leg, initial encounter for closed fracture: Secondary | ICD-10-CM | POA: Diagnosis not present

## 2012-09-07 DIAGNOSIS — I517 Cardiomegaly: Secondary | ICD-10-CM

## 2012-09-07 LAB — CBC WITH DIFFERENTIAL/PLATELET
Basophils Absolute: 0 10*3/uL (ref 0.0–0.1)
Basophils Relative: 0 % (ref 0–1)
Eosinophils Relative: 2 % (ref 0–5)
HCT: 39.2 % (ref 39.0–52.0)
MCHC: 33.7 g/dL (ref 30.0–36.0)
MCV: 83.9 fL (ref 78.0–100.0)
Monocytes Absolute: 0.6 10*3/uL (ref 0.1–1.0)
RDW: 14.4 % (ref 11.5–15.5)

## 2012-09-07 LAB — BASIC METABOLIC PANEL
CO2: 26 mEq/L (ref 19–32)
Calcium: 10 mg/dL (ref 8.4–10.5)
Creatinine, Ser: 9.59 mg/dL — ABNORMAL HIGH (ref 0.50–1.35)
GFR calc Af Amer: 6 mL/min — ABNORMAL LOW (ref >90)

## 2012-09-07 MED ORDER — OXYCODONE-ACETAMINOPHEN 5-325 MG PO TABS
1.0000 | ORAL_TABLET | Freq: Once | ORAL | Status: AC
  Administered 2012-09-07: 1 via ORAL
  Filled 2012-09-07: qty 1

## 2012-09-07 MED ORDER — SODIUM CHLORIDE 0.9 % IV SOLN
Freq: Once | INTRAVENOUS | Status: AC
  Administered 2012-09-07: via INTRAVENOUS

## 2012-09-07 NOTE — ED Notes (Signed)
Patient transported to X-ray 

## 2012-09-07 NOTE — Progress Notes (Signed)
Called by ER MD regarding left tibia fracture.    Patient with multiple medical problems, on dialysis, severe HTN, with fall on porch.  Displaced Left tibia fracture, instructions given regarding splinting.  Patient with AROM of toes per ER MD report, no signs of compartment syndrome.    Will plan for IM nail tomorrow if optimized.  Full consult to follow.  NPO after midnight.  I have spoken with family via telephone and will go through the risks, benefits, and alternatives in more detail in am.  Eulas Post, MD 11:09 PM

## 2012-09-07 NOTE — ED Notes (Signed)
Pt presents for evaluation of a fall that occurred prior to arrival.  Pt slipped on porch and injured left lower leg, obvious deformity noted to extremity, pedal pulses present - extremity warm.  IV started, pt rating pain 4/10.

## 2012-09-07 NOTE — ED Notes (Signed)
The pt has pain in his lt lower leg and is unstable. The pt slipped on his proch and fell.  He is a dialysis pt and is due in the am

## 2012-09-07 NOTE — Progress Notes (Signed)
Orthopedic Tech Progress Note Patient Details:  Oscar Castillo [birth year] [id number]  Ortho Devices Type of Ortho Device: Stirrup splint;Post (short leg) splint Ortho Device/Splint Location: left leg Ortho Device/Splint Interventions: Application   Nyrah Demos 09/07/2012, 10:58 PM

## 2012-09-07 NOTE — ED Provider Notes (Signed)
History     CSN: [id number]  Arrival date & time 09/07/12  2132   First MD Initiated Contact with Patient 09/07/12 2137      Chief Complaint  Patient presents with  . lower leg injury     (Consider location/radiation/quality/duration/timing/severity/associated sxs/prior treatment) Patient is a 62 y.o. male presenting with fall. The history is provided by the patient. No language interpreter was used.  Fall The accident occurred less than 1 hour ago. Fall occurred: Pt slipped and fell on ice on his front porch, injuring the left lower leg.   He fell from a height of 1 to 2 ft. There was no blood loss. Point of impact: Left lower leg. Pain location: Left lower leg. The pain is at a severity of 10/10. The pain is severe. He was not ambulatory at the scene. There was no entrapment after the fall. There was no drug use involved in the accident. There was no alcohol use involved in the accident. Pertinent negatives include no loss of consciousness. The symptoms are aggravated by use of the injured limb. Treatments tried: To ED via private car.    Past Medical History  Diagnosis Date  . Abdominal pain, generalized 05/05/2007  . ABDOMINAL TENDERNESS 11/06/2009  . ABDOMINAL ULTRASOUND, ABNORMAL 04/23/2010  . ALLERGIC RHINITIS 05/05/2007  . BACK PAIN, LUMBAR, CHRONIC 08/06/2009  . BENIGN PROSTATIC HYPERTROPHY 08/01/2010  . CEREBROVASCULAR ACCIDENT, HX OF 08/06/2009  . CHOLELITHIASIS 08/01/2010  . CHRONIC KIDNEY DISEASE UNSPECIFIED 11/06/2009  . CONGESTIVE HEART FAILURE 03/18/2009  . DEPRESSION 03/18/2009  . DIABETES MELLITUS, TYPE II 03/25/2007  . ERECTILE DYSFUNCTION 03/25/2007  . FLANK PAIN, RIGHT 11/06/2009  . GERD 03/25/2007  . HEPATITIS C, HX OF 03/25/2007  . HYPERTENSION 03/25/2007  . Morbid obesity 03/25/2007  . NEPHROLITHIASIS, HX OF 03/18/2009  . RENAL INSUFFICIENCY 08/06/2009  . SHOULDER PAIN, RIGHT 09/18/2009  . THUMB PAIN, RIGHT 04/23/2010  . WEAKNESS 04/23/2010    Past Surgical History   Procedure Laterality Date  . Nephrectomy      partial RR  . Av fistula placement  06/14/2012    Procedure: ARTERIOVENOUS (AV) FISTULA CREATION;  Surgeon: Chuck Hint, MD;  Location: [facility] OR;  Service: Vascular;  Laterality: Left;  Left basilic vein transposition with fistula.    Family History  Problem Relation Age of Onset  . Coronary artery disease Other   . Diabetes Other     History  Substance Use Topics  . Smoking status: Never Smoker   . Smokeless tobacco: Not on file  . Alcohol Use: No      Review of Systems  Constitutional: Negative.   HENT: Negative.   Eyes: Negative.   Respiratory: Negative.   Cardiovascular: Negative.   Gastrointestinal: Negative.   Genitourinary:       Is on hemodialysis.   Musculoskeletal:       Injury to left lower leg.  Neurological: Negative.  Negative for loss of consciousness.  Psychiatric/Behavioral: Negative.     Allergies  Review of patient's allergies indicates no known allergies.  Home Medications   Current Outpatient Rx  Name  Route  Sig  Dispense  Refill  . amLODipine (NORVASC) 10 MG tablet   Oral   Take 1 tablet (10 mg total) by mouth daily.   90 tablet   3   . aspirin 81 MG EC tablet   Oral   Take 81 mg by mouth daily.           [name] Brinzolamide-Brimonidine [facility])  1-0.2 % SUSP   Right Eye   Place 1 drop into the right eye 2 (two) times daily.         . calcium acetate (PHOSLO) 667 MG capsule   Oral   Take 2 capsules (1,334 mg total) by mouth 3 (three) times daily with meals.   180 capsule   0   . cloNIDine (CATAPRES) 0.1 MG tablet   Oral   Take 1 tablet (0.1 mg total) by mouth 2 (two) times daily.   180 tablet   2   . hydrALAZINE (APRESOLINE) 100 MG tablet   Oral   Take 1 tablet (100 mg total) by mouth 3 (three) times daily.   90 tablet   11   . insulin glargine (LANTUS) 100 UNIT/ML injection   Subcutaneous   Inject 5 Units into the skin at bedtime.   10 mL      . isosorbide  mononitrate (IMDUR) 60 MG 24 hr tablet   Oral   Take 1 tablet (60 mg total) by mouth daily.   30 tablet   0   . labetalol (NORMODYNE) 300 MG tablet      TAKE ONE TABLET BY MOUTH TWICE DAILY   60 tablet   5   . pravastatin (PRAVACHOL) 20 MG tablet      Take one (1) tablet(s) once daily   90 tablet   3   . traMADol (ULTRAM) 50 MG tablet   Oral   Take 50 mg by mouth 4 (four) times daily as needed. For pain         . Travoprost, BAK Free, (TRAVATAN) 0.004 % SOLN ophthalmic solution   Both Eyes   Place 1 drop into both eyes at bedtime.           BP 229/106  Pulse 110  Temp(Src) 98.6 F (37 C) (Oral)  Resp 16  SpO2 100%  Physical Exam  Nursing note and vitals reviewed. Constitutional: He appears well-developed and well-nourished.  BP 229/106.  HENT:  Head: Normocephalic and atraumatic.  Right Ear: External ear normal.  Left Ear: External ear normal.  Mouth/Throat: Oropharynx is clear and moist.  Eyes: Conjunctivae and EOM are normal. Pupils are equal, round, and reactive to light.  Neck: Normal range of motion. Neck supple.  Cardiovascular: Normal rate, regular rhythm and normal heart sounds.   Pulmonary/Chest: Effort normal and breath sounds normal.  Abdominal: Soft. Bowel sounds are normal.  Musculoskeletal:  He has swelling and tenderness over the lower third of the left lower leg.  Skin intact.  Intact pulses, sensation and tendon function in the left foot.    Skin: Skin is warm and dry.  Psychiatric: He has a normal mood and affect. His behavior is normal.    ED Course  SPLINT APPLICATION Date/Time: 09/07/2012 10:50 PM Performed by: Osvaldo Human Authorized by: Osvaldo Human Consent: Verbal consent obtained. Risks and benefits: risks, benefits and alternatives were discussed Consent given by: patient Patient understanding: patient states understanding of the procedure being performed Patient consent: the patient's understanding of the  procedure matches consent given Patient identity confirmed: verbally with patient Time out: Immediately prior to procedure a "time out" was called to verify the correct patient, procedure, equipment, support staff and site/side marked as required. Location details: left leg Splint type: short leg Supplies used: cotton padding, Ortho-Glass and elastic bandage Post-procedure: The splinted body part was neurovascularly unchanged following the procedure. Patient tolerance: Patient tolerated the procedure well with no  immediate complications. Comments: Left lower leg posterior splint and stirrup applied by me and by the orthopedic technicians.   (including critical care time)  9:50 PM Pt seen --> physical exam performed.  Pt did not want pain medicine when I saw him.  Ordered X-ray of left lower leg.   10:53 PM Pt has fractures of the left distal tibia and fibula.  Call to Dr. Dion Saucier, on call for orthopedics, who advised posterior splint.  Will contact [facility] to admit him and treat him for his hypertension, and also will contact [facility] to arrange for his hemodialysis.     Date: 09/07/2012  Rate: 94  Rhythm: normal sinus rhythm  QRS Axis: left  Intervals: PR prolonged QRS:  Q waves in inferior leads suggest old inferior myocardial infarction; Poor R wave progression in anterior leads suggests old anterior myocardial infarction.  ST/T Wave abnormalities: normal  Conduction Disutrbances:none  Narrative Interpretation: Abnormal EKG  Old EKG Reviewed: changes noted--PVC's seen on 06/11/2012 have ceased.   Results for orders placed during the hospital encounter of 09/07/12  CBC WITH DIFFERENTIAL      Result Value Range   WBC 10.7 (*) 4.0 - 10.5 K/uL   RBC 4.67  4.22 - 5.81 MIL/uL   Hemoglobin 13.2  13.0 - 17.0 g/dL   HCT 16.1  09.6 - 04.5 %   MCV 83.9  78.0 - 100.0 fL   MCH 28.3  26.0 - 34.0 pg   MCHC 33.7  30.0 - 36.0 g/dL   RDW 40.9  81.1 - 91.4 %   Platelets 283   150 - 400 K/uL   Neutrophils Relative 77  43 - 77 %   Neutro Abs 8.3 (*) 1.7 - 7.7 K/uL   Lymphocytes Relative 15  12 - 46 %   Lymphs Abs 1.6  0.7 - 4.0 K/uL   Monocytes Relative 6  3 - 12 %   Monocytes Absolute 0.6  0.1 - 1.0 K/uL   Eosinophils Relative 2  0 - 5 %   Eosinophils Absolute 0.2  0.0 - 0.7 K/uL   Basophils Relative 0  0 - 1 %   Basophils Absolute 0.0  0.0 - 0.1 K/uL  BASIC METABOLIC PANEL      Result Value Range   Sodium 137  135 - 145 mEq/L   Potassium 5.3 (*) 3.5 - 5.1 mEq/L   Chloride 94 (*) 96 - 112 mEq/L   CO2 26  19 - 32 mEq/L   Glucose, Bld 173 (*) 70 - 99 mg/dL   BUN 62 (*) 6 - 23 mg/dL   Creatinine, Ser 7.82 (*) 0.50 - 1.35 mg/dL   Calcium 95.6  8.4 - 21.3 mg/dL   GFR calc non Af Amer 5 (*) >90 mL/min   GFR calc Af Amer 6 (*) >90 mL/min   Dg Chest 1 View  09/07/2012  *RADIOLOGY REPORT*  Clinical Data: Preoperative chest radiograph for internal fixation of left tibia / fibula fractures.  CHEST - 1 VIEW  Comparison: Chest radiograph performed 06/11/2012  Findings: The lungs are well-aerated and clear.  There is no evidence of focal opacification, pleural effusion or pneumothorax.  The cardiomediastinal silhouette is within normal limits.  No acute osseous abnormalities are seen.  The patient's right-sided dual lumen catheter is noted ending about the cavoatrial junction.  IMPRESSION: No acute cardiopulmonary process seen.   Original Report Authenticated By: Tonia Ghent, M.D.    Dg Tibia/fibula Left  09/07/2012  *RADIOLOGY REPORT*  Clinical Data: Status post reduction of tibial and fibular fractures.  LEFT TIBIA AND FIBULA - 2 VIEW  Comparison: Left tibia / fibula radiographs performed earlier today at 09:09 p.m.  Findings: The comminuted fractures through the mid to distal tibial diaphysis and distal fibular diaphysis demonstrate mildly improved alignment.  No new fractures are seen.  The interosseous space is grossly preserved.  The ankle mortise is incompletely  assessed, but appears grossly unremarkable.  An overlying splint is noted.  Evaluation of the soft tissues is limited, though diffuse vascular calcifications are seen.  Mild chondrocalcinosis is noted at the knee joint.  IMPRESSION:  1.  Mildly improved alignment of comminuted fractures through the mid to distal tibial diaphysis and distal fibular diaphysis.  No new fractures seen. 2.  Diffuse vascular calcifications noted. 3.  Mild chondrocalcinosis at the knee joint.   Original Report Authenticated By: Tonia Ghent, M.D.    Dg Tibia/fibula Left  09/07/2012  *RADIOLOGY REPORT*  Clinical Data: Fall from porch.  Lower leg injury.  LEFT TIBIA AND FIBULA - 2 VIEW  Comparison: None.  Findings: Comminuted spiral fracture of the distal tibial diaphysis is observed.  Distal fibular metadiaphyseal fracture noted.  Chondrocalcinosis is present in the menisci of the knees.  Vascular calcifications are present.  IMPRESSION:  1.  Distal tibial shaft comminuted spiral fracture. 2.  Suspected oblique fracture of the distal fibular metadiaphysis. 3.  Chondrocalcinosis.   Original Report Authenticated By: Gaylyn Rong, M.D.    12:03 AM Case discussed with Dr. Adela Glimpse, who will admit pt.  Call to [facility] --> Dr. Briant Cedar informed that pt is due for dialysis in AM.        1. Fracture of left tibia, closed, initial encounter   2. Hypertension   3. End stage renal failure on dialysis         Carleene Cooper III, MD 09/08/12 [phone]

## 2012-09-08 ENCOUNTER — Encounter (HOSPITAL_COMMUNITY): Payer: Self-pay | Admitting: Internal Medicine

## 2012-09-08 DIAGNOSIS — Z79899 Other long term (current) drug therapy: Secondary | ICD-10-CM | POA: Diagnosis not present

## 2012-09-08 DIAGNOSIS — S82209A Unspecified fracture of shaft of unspecified tibia, initial encounter for closed fracture: Secondary | ICD-10-CM | POA: Diagnosis not present

## 2012-09-08 DIAGNOSIS — E1149 Type 2 diabetes mellitus with other diabetic neurological complication: Secondary | ICD-10-CM | POA: Diagnosis not present

## 2012-09-08 DIAGNOSIS — E119 Type 2 diabetes mellitus without complications: Secondary | ICD-10-CM | POA: Diagnosis not present

## 2012-09-08 DIAGNOSIS — E875 Hyperkalemia: Secondary | ICD-10-CM | POA: Diagnosis present

## 2012-09-08 DIAGNOSIS — S8263XA Displaced fracture of lateral malleolus of unspecified fibula, initial encounter for closed fracture: Secondary | ICD-10-CM | POA: Diagnosis not present

## 2012-09-08 DIAGNOSIS — Z7982 Long term (current) use of aspirin: Secondary | ICD-10-CM | POA: Diagnosis not present

## 2012-09-08 DIAGNOSIS — B182 Chronic viral hepatitis C: Secondary | ICD-10-CM | POA: Diagnosis present

## 2012-09-08 DIAGNOSIS — M549 Dorsalgia, unspecified: Secondary | ICD-10-CM | POA: Diagnosis not present

## 2012-09-08 DIAGNOSIS — I10 Essential (primary) hypertension: Secondary | ICD-10-CM

## 2012-09-08 DIAGNOSIS — D631 Anemia in chronic kidney disease: Secondary | ICD-10-CM | POA: Diagnosis not present

## 2012-09-08 DIAGNOSIS — S82202A Unspecified fracture of shaft of left tibia, initial encounter for closed fracture: Secondary | ICD-10-CM

## 2012-09-08 DIAGNOSIS — I5041 Acute combined systolic (congestive) and diastolic (congestive) heart failure: Secondary | ICD-10-CM | POA: Diagnosis not present

## 2012-09-08 DIAGNOSIS — K219 Gastro-esophageal reflux disease without esophagitis: Secondary | ICD-10-CM | POA: Diagnosis not present

## 2012-09-08 DIAGNOSIS — N2581 Secondary hyperparathyroidism of renal origin: Secondary | ICD-10-CM | POA: Diagnosis not present

## 2012-09-08 DIAGNOSIS — N186 End stage renal disease: Secondary | ICD-10-CM

## 2012-09-08 DIAGNOSIS — IMO0002 Reserved for concepts with insufficient information to code with codable children: Secondary | ICD-10-CM | POA: Diagnosis not present

## 2012-09-08 DIAGNOSIS — E1122 Type 2 diabetes mellitus with diabetic chronic kidney disease: Secondary | ICD-10-CM | POA: Diagnosis present

## 2012-09-08 DIAGNOSIS — I120 Hypertensive chronic kidney disease with stage 5 chronic kidney disease or end stage renal disease: Secondary | ICD-10-CM | POA: Diagnosis present

## 2012-09-08 DIAGNOSIS — N320 Bladder-neck obstruction: Secondary | ICD-10-CM | POA: Diagnosis not present

## 2012-09-08 DIAGNOSIS — Z992 Dependence on renal dialysis: Secondary | ICD-10-CM | POA: Diagnosis not present

## 2012-09-08 DIAGNOSIS — S82899A Other fracture of unspecified lower leg, initial encounter for closed fracture: Secondary | ICD-10-CM | POA: Diagnosis not present

## 2012-09-08 DIAGNOSIS — N039 Chronic nephritic syndrome with unspecified morphologic changes: Secondary | ICD-10-CM | POA: Diagnosis not present

## 2012-09-08 DIAGNOSIS — I5043 Acute on chronic combined systolic (congestive) and diastolic (congestive) heart failure: Secondary | ICD-10-CM | POA: Diagnosis not present

## 2012-09-08 DIAGNOSIS — Z8673 Personal history of transient ischemic attack (TIA), and cerebral infarction without residual deficits: Secondary | ICD-10-CM | POA: Diagnosis not present

## 2012-09-08 DIAGNOSIS — Z5189 Encounter for other specified aftercare: Secondary | ICD-10-CM | POA: Diagnosis not present

## 2012-09-08 DIAGNOSIS — D638 Anemia in other chronic diseases classified elsewhere: Secondary | ICD-10-CM | POA: Diagnosis not present

## 2012-09-08 DIAGNOSIS — K5900 Constipation, unspecified: Secondary | ICD-10-CM | POA: Diagnosis present

## 2012-09-08 DIAGNOSIS — D649 Anemia, unspecified: Secondary | ICD-10-CM | POA: Diagnosis present

## 2012-09-08 DIAGNOSIS — W19XXXA Unspecified fall, initial encounter: Secondary | ICD-10-CM | POA: Diagnosis not present

## 2012-09-08 DIAGNOSIS — E1142 Type 2 diabetes mellitus with diabetic polyneuropathy: Secondary | ICD-10-CM | POA: Diagnosis not present

## 2012-09-08 DIAGNOSIS — I509 Heart failure, unspecified: Secondary | ICD-10-CM | POA: Diagnosis present

## 2012-09-08 LAB — GLUCOSE, CAPILLARY
Glucose-Capillary: 136 mg/dL — ABNORMAL HIGH (ref 70–99)
Glucose-Capillary: 205 mg/dL — ABNORMAL HIGH (ref 70–99)
Glucose-Capillary: 166 mg/dL — ABNORMAL HIGH (ref 70–99)
Glucose-Capillary: 170 mg/dL — ABNORMAL HIGH (ref 70–99)

## 2012-09-08 LAB — CBC
HCT: 35 % — ABNORMAL LOW (ref 39.0–52.0)
Hemoglobin: 11.4 g/dL — ABNORMAL LOW (ref 13.0–17.0)
MCH: 27.3 pg (ref 26.0–34.0)
MCHC: 32.6 g/dL (ref 30.0–36.0)
MCV: 83.7 fL (ref 78.0–100.0)
Platelets: 249 10*3/uL (ref 150–400)
RBC: 4.18 MIL/uL — ABNORMAL LOW (ref 4.22–5.81)
RDW: 14.3 % (ref 11.5–15.5)
WBC: 9.7 10*3/uL (ref 4.0–10.5)

## 2012-09-08 LAB — COMPREHENSIVE METABOLIC PANEL
ALT: 17 U/L (ref 0–53)
AST: 26 U/L (ref 0–37)
Albumin: 3.3 g/dL — ABNORMAL LOW (ref 3.5–5.2)
Alkaline Phosphatase: 86 U/L (ref 39–117)
BUN: 64 mg/dL — ABNORMAL HIGH (ref 6–23)
CO2: 22 mEq/L (ref 19–32)
Calcium: 9.4 mg/dL (ref 8.4–10.5)
Chloride: 95 mEq/L — ABNORMAL LOW (ref 96–112)
Creatinine, Ser: 9.97 mg/dL — ABNORMAL HIGH (ref 0.50–1.35)
GFR calc Af Amer: 6 mL/min — ABNORMAL LOW (ref >90)
GFR calc non Af Amer: 5 mL/min — ABNORMAL LOW (ref >90)
Glucose, Bld: 158 mg/dL — ABNORMAL HIGH (ref 70–99)
Potassium: 4.5 mEq/L (ref 3.5–5.1)
Sodium: 135 mEq/L (ref 135–145)
Total Bilirubin: 0.2 mg/dL — ABNORMAL LOW (ref 0.3–1.2)
Total Protein: 7.6 g/dL (ref 6.0–8.3)

## 2012-09-08 LAB — PHOSPHORUS: Phosphorus: 6.6 mg/dL — ABNORMAL HIGH (ref 2.3–4.6)

## 2012-09-08 LAB — MAGNESIUM: Magnesium: 2 mg/dL (ref 1.5–2.5)

## 2012-09-08 LAB — TSH: TSH: 1.061 u[IU]/mL (ref 0.350–4.500)

## 2012-09-08 MED ORDER — LABETALOL HCL 5 MG/ML IV SOLN
10.0000 mg | INTRAVENOUS | Status: DC | PRN
  Administered 2012-09-10: 10 mg via INTRAVENOUS
  Filled 2012-09-08 (×2): qty 4

## 2012-09-08 MED ORDER — ACETAMINOPHEN 650 MG RE SUPP: 650.0000 mg | Freq: Four times a day (QID) | RECTAL | Status: DC | PRN

## 2012-09-08 MED ORDER — BRINZOLAMIDE 1 % OP SUSP
1.0000 [drp] | Freq: Two times a day (BID) | OPHTHALMIC | Status: DC
  Administered 2012-09-08 – 2012-09-12 (×7): 1 [drp] via OPHTHALMIC
  Filled 2012-09-08 (×4): qty 10

## 2012-09-08 MED ORDER — SODIUM CHLORIDE 0.9 % IJ SOLN: 3.0000 mL | INTRAMUSCULAR | Status: DC | PRN

## 2012-09-08 MED ORDER — DOXERCALCIFEROL 4 MCG/2ML IV SOLN
INTRAVENOUS | Status: AC
  Administered 2012-09-08: 4 ug via INTRAVENOUS
  Filled 2012-09-08: qty 4

## 2012-09-08 MED ORDER — SODIUM CHLORIDE 0.9 % IV SOLN
250.0000 mL | INTRAVENOUS | Status: DC | PRN
  Administered 2012-09-09: 500 mL via INTRAVENOUS
  Administered 2012-09-09: 10:00:00 via INTRAVENOUS

## 2012-09-08 MED ORDER — TRAVOPROST (BAK FREE) 0.004 % OP SOLN
1.0000 [drp] | Freq: Every day | OPHTHALMIC | Status: DC
  Administered 2012-09-08 – 2012-09-11 (×4): 1 [drp] via OPHTHALMIC
  Filled 2012-09-08 (×3): qty 2.5

## 2012-09-08 MED ORDER — INSULIN ASPART 100 UNIT/ML ~~LOC~~ SOLN
0.0000 [IU] | Freq: Three times a day (TID) | SUBCUTANEOUS | Status: DC
  Administered 2012-09-09 – 2012-09-10 (×3): 2 [IU] via SUBCUTANEOUS
  Administered 2012-09-10: 3 [IU] via SUBCUTANEOUS
  Administered 2012-09-11 (×2): 2 [IU] via SUBCUTANEOUS
  Administered 2012-09-11 – 2012-09-12 (×2): 3 [IU] via SUBCUTANEOUS

## 2012-09-08 MED ORDER — MORPHINE SULFATE 2 MG/ML IJ SOLN
2.0000 mg | INTRAMUSCULAR | Status: DC | PRN
  Administered 2012-09-08: 2 mg via INTRAVENOUS
  Administered 2012-09-09 – 2012-09-10 (×2): 4 mg via INTRAVENOUS
  Filled 2012-09-08 (×2): qty 2

## 2012-09-08 MED ORDER — SODIUM CHLORIDE 0.9 % IJ SOLN
3.0000 mL | Freq: Two times a day (BID) | INTRAMUSCULAR | Status: DC
  Administered 2012-09-08 – 2012-09-12 (×7): 3 mL via INTRAVENOUS

## 2012-09-08 MED ORDER — PANTOPRAZOLE SODIUM 40 MG IV SOLR
40.0000 mg | Freq: Every day | INTRAVENOUS | Status: DC
  Administered 2012-09-08 – 2012-09-09 (×2): 40 mg via INTRAVENOUS
  Filled 2012-09-08 (×4): qty 40

## 2012-09-08 MED ORDER — LABETALOL HCL 300 MG PO TABS
300.0000 mg | ORAL_TABLET | Freq: Two times a day (BID) | ORAL | Status: DC
  Administered 2012-09-08 – 2012-09-12 (×8): 300 mg via ORAL
  Filled 2012-09-08 (×12): qty 1

## 2012-09-08 MED ORDER — ONDANSETRON HCL 4 MG PO TABS: 4.0000 mg | ORAL_TABLET | Freq: Four times a day (QID) | ORAL | Status: DC | PRN

## 2012-09-08 MED ORDER — SODIUM CHLORIDE 0.9 % IV SOLN
62.5000 mg | INTRAVENOUS | Status: DC
  Administered 2012-09-08: 62.5 mg via INTRAVENOUS
  Filled 2012-09-08 (×2): qty 5

## 2012-09-08 MED ORDER — BRIMONIDINE TARTRATE 0.2 % OP SOLN
1.0000 [drp] | Freq: Two times a day (BID) | OPHTHALMIC | Status: DC
  Administered 2012-09-08 – 2012-09-12 (×7): 1 [drp] via OPHTHALMIC
  Filled 2012-09-08 (×4): qty 5

## 2012-09-08 MED ORDER — ACETAMINOPHEN 325 MG PO TABS: 650.0000 mg | ORAL_TABLET | Freq: Four times a day (QID) | ORAL | Status: DC | PRN

## 2012-09-08 MED ORDER — DOCUSATE SODIUM 100 MG PO CAPS
100.0000 mg | ORAL_CAPSULE | Freq: Two times a day (BID) | ORAL | Status: DC
  Administered 2012-09-09 – 2012-09-12 (×6): 100 mg via ORAL
  Filled 2012-09-08 (×14): qty 1

## 2012-09-08 MED ORDER — INSULIN ASPART 100 UNIT/ML ~~LOC~~ SOLN: 0.0000 [IU] | Freq: Every day | SUBCUTANEOUS | Status: DC

## 2012-09-08 MED ORDER — CEFAZOLIN SODIUM-DEXTROSE 2-3 GM-% IV SOLR
2.0000 g | INTRAVENOUS | Status: AC
  Administered 2012-09-09: 2 g via INTRAVENOUS
  Filled 2012-09-08: qty 50

## 2012-09-08 MED ORDER — MORPHINE SULFATE 2 MG/ML IJ SOLN
INTRAMUSCULAR | Status: AC
  Administered 2012-09-08: 2 mg via INTRAVENOUS
  Filled 2012-09-08: qty 1

## 2012-09-08 MED ORDER — DOXERCALCIFEROL 4 MCG/2ML IV SOLN
6.0000 ug | INTRAVENOUS | Status: DC
  Administered 2012-09-08: 4 ug via INTRAVENOUS
  Administered 2012-09-10: 6 ug via INTRAVENOUS
  Filled 2012-09-08: qty 4

## 2012-09-08 MED ORDER — ONDANSETRON HCL 4 MG/2ML IJ SOLN: 4.0000 mg | Freq: Four times a day (QID) | INTRAMUSCULAR | Status: DC | PRN

## 2012-09-08 MED ORDER — HYDROMORPHONE HCL PF 1 MG/ML IJ SOLN: 1.0000 mg | INTRAMUSCULAR | Status: AC | PRN

## 2012-09-08 MED ORDER — BRINZOLAMIDE-BRIMONIDINE 1-0.2 % OP SUSP: 1.0000 [drp] | Freq: Two times a day (BID) | OPHTHALMIC | Status: DC

## 2012-09-08 MED ORDER — HYDROCODONE-ACETAMINOPHEN 5-325 MG PO TABS
1.0000 | ORAL_TABLET | ORAL | Status: DC | PRN
Start: 2012-09-08 — End: 2012-09-09
  Administered 2012-09-08 – 2012-09-09 (×5): 2 via ORAL
  Filled 2012-09-08 (×5): qty 2

## 2012-09-08 MED ORDER — AMLODIPINE BESYLATE 10 MG PO TABS
10.0000 mg | ORAL_TABLET | Freq: Every day | ORAL | Status: DC
  Administered 2012-09-10 – 2012-09-12 (×3): 10 mg via ORAL
  Filled 2012-09-08 (×5): qty 1

## 2012-09-08 NOTE — Progress Notes (Signed)
Nutrition Brief Note  Patient identified on the Malnutrition Screening Tool (MST) Report.  Wt Readings from Last 10 Encounters:  09/08/12 229 lb 4.5 oz (104 kg)  06/21/12 219 lb 12.8 oz (99.7 kg)  06/21/12 219 lb 12.8 oz (99.7 kg)  11/04/11 251 lb 2 oz (113.91 kg)  08/07/11 266 lb (120.657 kg)  05/06/11 265 lb (120.203 kg)  02/10/11 268 lb (121.564 kg)  01/30/11 273 lb 2 oz (123.889 kg)  08/01/10 264 lb 6.1 oz (119.923 kg)  04/24/10 251 lb 8 oz (114.08 kg)    Body mass index is 32.9 kg/(m^2). Pt meets criteria for Obesity Class I based on current BMI.   Current diet order is NPO.  Noted plan for surgery tomorrow.  Labs and medications reviewed.   No nutrition interventions warranted at this time. Please consult RD as needed.  Maureen Chatters, RD, LDN Pager #: [phone] After-Hours Pager #: [phone]

## 2012-09-08 NOTE — H&P (Signed)
PCP:  Oliver Barre, MD  [facility] [place]   Chief Complaint:  Left leg pain  HPI: Oscar Castillo is a 62 y.o. male   has a past medical history of Abdominal pain, generalized (05/05/2007); ABDOMINAL TENDERNESS (11/06/2009); ABDOMINAL ULTRASOUND, ABNORMAL (04/23/2010); ALLERGIC RHINITIS (05/05/2007); BACK PAIN, LUMBAR, CHRONIC (08/06/2009); BENIGN PROSTATIC HYPERTROPHY (08/01/2010); CEREBROVASCULAR ACCIDENT, HX OF (08/06/2009); CHOLELITHIASIS (08/01/2010); CHRONIC KIDNEY DISEASE UNSPECIFIED (11/06/2009); CONGESTIVE HEART FAILURE (03/18/2009); DEPRESSION (03/18/2009); DIABETES MELLITUS, TYPE II (03/25/2007); ERECTILE DYSFUNCTION (03/25/2007); FLANK PAIN, RIGHT (11/06/2009); GERD (03/25/2007); HEPATITIS C, HX OF (03/25/2007); HYPERTENSION (03/25/2007); Morbid obesity (03/25/2007); NEPHROLITHIASIS, HX OF (03/18/2009); RENAL INSUFFICIENCY (08/06/2009); SHOULDER PAIN, RIGHT (09/18/2009); THUMB PAIN, RIGHT (04/23/2010); and WEAKNESS (04/23/2010).   Presented with  Fall around 7 pm as he was cleaning the porch from snow trying to get it ready for going to his HD in AM. He has history of ESRD on HD on Tuesday Thursday and Saturday at [facility]. He has a Permanent catheter in his Right chest. He has a fistula in Left arm that is still maturing.  He reports no head injury no LOC. He is able to ambulate well at baseline and reports no exercise induced chest pain or shortness of breath. Denies any hx of hear disease. He thinks me may had a stress test about 2-3 years ago and it was unremarkable.  Of note he has history of diabetes but hadn't been taking any medications for quite some time as he is diabetes, under better control since his kidney failure.   Review of Systems:    Pertinent positives include: still able to make urine.  Constitutional:  No weight loss, night sweats, Fevers, chills, fatigue, weight loss  HEENT:  No headaches, Difficulty swallowing,Tooth/dental problems,Sore throat,  No sneezing, itching,  ear ache, nasal congestion, post nasal drip,  Cardio-vascular:  No chest pain, Orthopnea, PND, anasarca, dizziness, palpitations.no Bilateral lower extremity swelling  GI:  No heartburn, indigestion, abdominal pain, nausea, vomiting, diarrhea, change in bowel habits, loss of appetite, melena, blood in stool, hematemesis Resp:  no shortness of breath at rest. No dyspnea on exertion, No excess mucus, no productive cough, No non-productive cough, No coughing up of blood.No change in color of mucus.No wheezing. Skin:  no rash or lesions. No jaundice GU:  no dysuria, change in color of urine, no urgency or frequency. No straining to urinate.  No flank pain.  Musculoskeletal:  No joint pain or no joint swelling. No decreased range of motion. No back pain.  Psych:  No change in mood or affect. No depression or anxiety. No memory loss.  Neuro: no localizing neurological complaints, no tingling, no weakness, no double vision, no gait abnormality, no slurred speech, no confusion  Otherwise ROS are negative except for above, 10 systems were reviewed  Past Medical History: Past Medical History  Diagnosis Date  . Abdominal pain, generalized 05/05/2007  . ABDOMINAL TENDERNESS 11/06/2009  . ABDOMINAL ULTRASOUND, ABNORMAL 04/23/2010  . ALLERGIC RHINITIS 05/05/2007  . BACK PAIN, LUMBAR, CHRONIC 08/06/2009  . BENIGN PROSTATIC HYPERTROPHY 08/01/2010  . CEREBROVASCULAR ACCIDENT, HX OF 08/06/2009  . CHOLELITHIASIS 08/01/2010  . CHRONIC KIDNEY DISEASE UNSPECIFIED 11/06/2009  . CONGESTIVE HEART FAILURE 03/18/2009  . DEPRESSION 03/18/2009  . DIABETES MELLITUS, TYPE II 03/25/2007  . ERECTILE DYSFUNCTION 03/25/2007  . FLANK PAIN, RIGHT 11/06/2009  . GERD 03/25/2007  . HEPATITIS C, HX OF 03/25/2007  . HYPERTENSION 03/25/2007  . Morbid obesity 03/25/2007  . NEPHROLITHIASIS, HX OF 03/18/2009  . RENAL INSUFFICIENCY 08/06/2009  .  SHOULDER PAIN, RIGHT 09/18/2009  . THUMB PAIN, RIGHT 04/23/2010  . WEAKNESS 04/23/2010   Past  Surgical History  Procedure Laterality Date  . Nephrectomy      partial RR  . Av fistula placement  06/14/2012    Procedure: ARTERIOVENOUS (AV) FISTULA CREATION;  Surgeon: Chuck Hint, MD;  Location: [facility] OR;  Service: Vascular;  Laterality: Left;  Left basilic vein transposition with fistula.     Medications: Prior to Admission medications   Medication Sig Start Date End Date Taking? Authorizing Provider  amLODipine (NORVASC) 10 MG tablet Take 1 tablet (10 mg total) by mouth daily. 05/27/11  Yes Corwin Levins, MD  aspirin 81 MG EC tablet Take 81 mg by mouth daily.     Yes Historical Provider, MD  Brinzolamide-Brimonidine [facility]) 1-0.2 % SUSP Place 1 drop into the right eye 2 (two) times daily.   Yes Historical Provider, MD  calcium acetate (PHOSLO) 667 MG capsule Take 2 capsules (1,334 mg total) by mouth 3 (three) times daily with meals. 06/21/12  Yes Sorin Luanne Bras, MD  labetalol (NORMODYNE) 300 MG tablet TAKE ONE TABLET BY MOUTH TWICE DAILY 06/22/12  Yes Corwin Levins, MD  traMADol (ULTRAM) 50 MG tablet Take 50 mg by mouth 4 (four) times daily as needed. For pain   Yes Historical Provider, MD  Travoprost, BAK Free, (TRAVATAN) 0.004 % SOLN ophthalmic solution Place 1 drop into both eyes at bedtime.   Yes Historical Provider, MD    Allergies:  No Known Allergies  Social History:  Ambulatory   independently   Lives at   Home with wife   reports that he has never smoked. He does not have any smokeless tobacco history on file. He reports that he does not drink alcohol or use illicit drugs.   Family History: family history includes Coronary artery disease in his other and Diabetes in his other.    Physical Exam: Patient Vitals for the past 24 hrs:  BP Temp Temp src Pulse Resp SpO2  09/07/12 2343 179/95 mmHg - - 94 19 100 %  09/07/12 2139 229/106 mmHg 98.6 F (37 C) Oral 110 16 100 %    1. General:  in No Acute distress 2. Psychological: Alert and   Oriented 3.  Head/ENT:   Moist   Mucous Membranes                          Head Non traumatic, neck supple                          Norma  Dentition 4. SKIN: normal Skin turgor,  Skin clean Dry and intact no rash 5. Heart: Regular rate and rhythm no Murmur, Rub or gallop 6. Lungs: Clear to auscultation bilaterally, no wheezes or crackles   7. Abdomen: Soft, non-tender, Non distended 8. Lower extremities: no clubbing, cyanosis, or edema, Left leg in a splint. 9. Neurologically Grossly intact, moving all 4 extremities equally 10. MSK: Normal range of motion  body mass index is unknown because there is no weight on file.   Labs on Admission:   Recent Labs  09/07/12 2244  NA 137  K 5.3*  CL 94*  CO2 26  GLUCOSE 173*  BUN 62*  CREATININE 9.59*  CALCIUM 10.0   No results found for this basename: AST, ALT, ALKPHOS, BILITOT, PROT, ALBUMIN,  in the last 72 hours No results found for  this basename: LIPASE, AMYLASE,  in the last 72 hours  Recent Labs  09/07/12 2244  WBC 10.7*  NEUTROABS 8.3*  HGB 13.2  HCT 39.2  MCV 83.9  PLT 283   No results found for this basename: CKTOTAL, CKMB, CKMBINDEX, TROPONINI,  in the last 72 hours No results found for this basename: TSH, T4TOTAL, FREET3, T3FREE, THYROIDAB,  in the last 72 hours No results found for this basename: VITAMINB12, FOLATE, FERRITIN, TIBC, IRON, RETICCTPCT,  in the last 72 hours Lab Results  Component Value Date   HGBA1C 5.9* 06/12/2012    The CrCl is unknown because both a height and weight (above a minimum accepted value) are required for this calculation. ABG    Component Value Date/Time   TCO2 20 06/11/2012 0926     No results found for this basename: DDIMER     Other results:  I have pearsonaly reviewed this: ECG REPORT  Rate: 92  Rhythm: SR ST&T Change: no ischemic changes. Lead I t wave inversion unchanged from prior   Cultures:    Component Value Date/Time   SDES URINE, CLEAN CATCH 09/14/2007 1705    SPECREQUEST IMMUNE:NORM UT SYMPT:POS 09/14/2007 1705   CULT NO GROWTH 09/14/2007 1705   REPTSTATUS 09/16/2007 FINAL 09/14/2007 1705       Radiological Exams on Admission: Dg Chest 1 View  09/07/2012  *RADIOLOGY REPORT*  Clinical Data: Preoperative chest radiograph for internal fixation of left tibia / fibula fractures.  CHEST - 1 VIEW  Comparison: Chest radiograph performed 06/11/2012  Findings: The lungs are well-aerated and clear.  There is no evidence of focal opacification, pleural effusion or pneumothorax.  The cardiomediastinal silhouette is within normal limits.  No acute osseous abnormalities are seen.  The patient's right-sided dual lumen catheter is noted ending about the cavoatrial junction.  IMPRESSION: No acute cardiopulmonary process seen.   Original Report Authenticated By: Tonia Ghent, M.D.    Dg Tibia/fibula Left  09/07/2012  *RADIOLOGY REPORT*  Clinical Data: Status post reduction of tibial and fibular fractures.  LEFT TIBIA AND FIBULA - 2 VIEW  Comparison: Left tibia / fibula radiographs performed earlier today at 09:09 p.m.  Findings: The comminuted fractures through the mid to distal tibial diaphysis and distal fibular diaphysis demonstrate mildly improved alignment.  No new fractures are seen.  The interosseous space is grossly preserved.  The ankle mortise is incompletely assessed, but appears grossly unremarkable.  An overlying splint is noted.  Evaluation of the soft tissues is limited, though diffuse vascular calcifications are seen.  Mild chondrocalcinosis is noted at the knee joint.  IMPRESSION:  1.  Mildly improved alignment of comminuted fractures through the mid to distal tibial diaphysis and distal fibular diaphysis.  No new fractures seen. 2.  Diffuse vascular calcifications noted. 3.  Mild chondrocalcinosis at the knee joint.   Original Report Authenticated By: Tonia Ghent, M.D.    Dg Tibia/fibula Left  09/07/2012  *RADIOLOGY REPORT*  Clinical Data: Fall from porch.   Lower leg injury.  LEFT TIBIA AND FIBULA - 2 VIEW  Comparison: None.  Findings: Comminuted spiral fracture of the distal tibial diaphysis is observed.  Distal fibular metadiaphyseal fracture noted.  Chondrocalcinosis is present in the menisci of the knees.  Vascular calcifications are present.  IMPRESSION:  1.  Distal tibial shaft comminuted spiral fracture. 2.  Suspected oblique fracture of the distal fibular metadiaphysis. 3.  Chondrocalcinosis.   Original Report Authenticated By: Gaylyn Rong, M.D.     Chart has been  reviewed  Assessment/Plan  62 year old gentleman with history of diabetes and end-stage renal disease on dialysis here with mechanical fall resulting in tibial fibular fracture on the left. Been admitted by hospitalist service for medical management.   Present on Admission:  . Diabetes mellitus - patient is currently not on a diet controlled will write for sliding scale insulin  . ESRD (end stage renal disease) on dialysis - ER has notified to nephrology but the patient who will need to have hemodialysis in AM no acute indication for emergent HD . HTN (hypertension), malignant - once patient able to tolerate by mouth we'll restart his home medications for now has written for albuterol when necessary when necessary Right tibial fracture - orthopedics to evaluate and likely will need operative repair. From cardiac standpoint there is no further cardiac workup indicated at this time prior to proceeding to operation. Hospitalist service will assist with diabetes management and hypertension management.   Prophylaxis: SCD, Protonix  CODE STATUS: FULL CODE  Other plan as per orders.  I have spent a total of  55 min on this admission  Syanna Remmert 09/08/2012, 12:40 AM

## 2012-09-08 NOTE — Progress Notes (Signed)
Pharmacy consult: medication reconciliation  Consult per MD request: new dialysis patient, his medication schedule is confusing to him, he may need some help understanding the scheduling of his meds.  Assessment 62 yo male with ESRD on HD. Pharmacy was requested to discuss the medication schedule with the patient.  Upon inquiry the patient denied any confusion regarding his medications.  I offered to discuss any medication with him whether PTA or current and he did not request further information or have any questions.   Plan -Will sign off as patient did not have questions or desire any further information about his medications -If there are any further needs please consult pharmacy again.  Thank you for asking pharmacy to be involved in the care of this patient.  Harland German, Pharm D 09/08/2012 12:27 PM    Medications:  Prescriptions prior to admission  Medication Sig Dispense Refill  . amLODipine (NORVASC) 10 MG tablet Take 1 tablet (10 mg total) by mouth daily.  90 tablet  3  . aspirin 81 MG EC tablet Take 81 mg by mouth daily.        . Brinzolamide-Brimonidine (SIMBRINZA) 1-0.2 % SUSP Place 1 drop into the right eye 2 (two) times daily.      . calcium acetate (PHOSLO) 667 MG capsule Take 2 capsules (1,334 mg total) by mouth 3 (three) times daily with meals.  180 capsule  0  . labetalol (NORMODYNE) 300 MG tablet TAKE ONE TABLET BY MOUTH TWICE DAILY  60 tablet  5  . traMADol (ULTRAM) 50 MG tablet Take 50 mg by mouth 4 (four) times daily as needed. For pain      . Travoprost, BAK Free, (TRAVATAN) 0.004 % SOLN ophthalmic solution Place 1 drop into both eyes at bedtime.       Scheduled:  . [COMPLETED] sodium chloride   Intravenous Once  . amLODipine  10 mg Oral Daily  . brimonidine  1 drop Both Eyes BID  . brinzolamide  1 drop Both Eyes BID  . docusate sodium  100 mg Oral BID  . insulin aspart  0-5 Units Subcutaneous QHS  . insulin aspart  0-9 Units Subcutaneous TID WC  .  labetalol  300 mg Oral BID  . [COMPLETED] oxyCODONE-acetaminophen  1 tablet Oral Once  . pantoprazole (PROTONIX) IV  40 mg Intravenous QHS  . sodium chloride  3 mL Intravenous Q12H  . Travoprost (BAK Free)  1 drop Both Eyes QHS  . [DISCONTINUED] Brinzolamide-Brimonidine  1 drop Right Eye BID

## 2012-09-08 NOTE — Progress Notes (Signed)
Patient ID: Oscar Castillo, male   DOB: 02-20-1951, 62 y.o.   MRN: [id number]     Subjective:  Patient reports pain as mild to moderate.  He denies any CP or SOB. He is scheduled for dialysis today.  Objective:   VITALS:   Filed Vitals:   09/08/12 0030 09/08/12 0205 09/08/12 0324 09/08/12 0445  BP: 172/84 198/107 182/110 163/87  Pulse: 96 89 87 85  Temp:  98.5 F (36.9 C)  98.2 F (36.8 C)  TempSrc:  Oral  Oral  Resp: 16 16  16   Height:  5\' 10"  (1.778 m)    Weight:  104 kg (229 lb 4.5 oz)    SpO2: 100% 100%  100%    ABD soft Sensation intact distally Dorsiflexion/Plantar flexion intact No clinical sign of compartment syndrome    Lab Results  Component Value Date   WBC 9.7 09/08/2012   HGB 11.4* 09/08/2012   HCT 35.0* 09/08/2012   MCV 83.7 09/08/2012   PLT 249 09/08/2012     Assessment/Plan: * Surgery Date in Future *   Principal Problem:   Left tibial fracture Active Problems:   Acute combined systolic and diastolic heart failure, NYHA class 2-EF 45%   HTN (hypertension), malignant   Diabetes mellitus   ESRD (end stage renal disease) on dialysis   Plan to fix his tibia tomorrow late morning pending OR availablility. Discussed with Nephrology and [facility], and best to wait until tomorrow for IM nail, after he has stabilized from his dialysis. NPO after midnight   Haskel Khan 09/08/2012, 10:35 AM   Teryl Lucy, MD Cell [phone] Pager [phone]

## 2012-09-08 NOTE — Consult Note (Signed)
[facility] Renal Consultation Note  Indication for Consultation:  Management of ESRD/hemodialysis; anemia, hypertension/volume and secondary hyperparathyroidism  HPI: Oscar Castillo is a 62 y.o. male with ESRD on dialysis since November on TTS at the [facility] who was clearing snow from his porch yesterday when he slipped and fell onto some concrete steps, injuring his right lower leg.  X-ray showed displaced distal tibial shaft fracture.  He was seen by Dr. Teryl Lucy of Orthopedics and surgical repair was planned for tomorrow.  He reports mild left leg pain, but is stable and will have dialysis today.  Dialysis Orders: Center: [place] on TTS. EDW 99 kg  HD Bath 2K/2.25Ca  Time 4 hrs  Heparin 5000 U. Access Right IJ catheter, also L BVT (placed 06/14/12)   BFR 400 DFR 800   Hectorol 6 mcg IV/HD   Epogen 0 Units IV/HD  Venofer 50 mg on Thurs.   Past Medical History  Diagnosis Date  . Abdominal pain, generalized 05/05/2007  . ABDOMINAL TENDERNESS 11/06/2009  . ABDOMINAL ULTRASOUND, ABNORMAL 04/23/2010  . ALLERGIC RHINITIS 05/05/2007  . BACK PAIN, LUMBAR, CHRONIC 08/06/2009  . BENIGN PROSTATIC HYPERTROPHY 08/01/2010  . CEREBROVASCULAR ACCIDENT, HX OF 08/06/2009  . CHOLELITHIASIS 08/01/2010  . CHRONIC KIDNEY DISEASE UNSPECIFIED 11/06/2009  . CONGESTIVE HEART FAILURE 03/18/2009  . DEPRESSION 03/18/2009  . DIABETES MELLITUS, TYPE II 03/25/2007  . ERECTILE DYSFUNCTION 03/25/2007  . FLANK PAIN, RIGHT 11/06/2009  . GERD 03/25/2007  . HEPATITIS C, HX OF 03/25/2007  . HYPERTENSION 03/25/2007  . Morbid obesity 03/25/2007  . NEPHROLITHIASIS, HX OF 03/18/2009  . RENAL INSUFFICIENCY 08/06/2009  . SHOULDER PAIN, RIGHT 09/18/2009  . THUMB PAIN, RIGHT 04/23/2010  . WEAKNESS 04/23/2010   Past Surgical History  Procedure Laterality Date  . Nephrectomy      partial RR  . Av fistula placement  06/14/2012    Procedure: ARTERIOVENOUS (AV) FISTULA CREATION;  Surgeon: Chuck Hint,  MD;  Location: [facility] OR;  Service: Vascular;  Laterality: Left;  Left basilic vein transposition with fistula.   Family History  Problem Relation Age of Onset  . Coronary artery disease Other   . Diabetes Other    Social History He denies any history of tobacco, alcohol, or illicit drug use.  He previously worked for [employer] and lives with his wife.   No Known Allergies Prior to Admission medications   Medication Sig Start Date End Date Taking? Authorizing Provider  amLODipine (NORVASC) 10 MG tablet Take 1 tablet (10 mg total) by mouth daily. 05/27/11  Yes Corwin Levins, MD  aspirin 81 MG EC tablet Take 81 mg by mouth daily.     Yes Historical Provider, MD  Brinzolamide-Brimonidine [facility]) 1-0.2 % SUSP Place 1 drop into the right eye 2 (two) times daily.   Yes Historical Provider, MD  calcium acetate (PHOSLO) 667 MG capsule Take 2 capsules (1,334 mg total) by mouth 3 (three) times daily with meals. 06/21/12  Yes Sorin Luanne Bras, MD  labetalol (NORMODYNE) 300 MG tablet TAKE ONE TABLET BY MOUTH TWICE DAILY 06/22/12  Yes Corwin Levins, MD  traMADol (ULTRAM) 50 MG tablet Take 50 mg by mouth 4 (four) times daily as needed. For pain   Yes Historical Provider, MD  Travoprost, BAK Free, (TRAVATAN) 0.004 % SOLN ophthalmic solution Place 1 drop into both eyes at bedtime.   Yes Historical Provider, MD   Labs:  Results for orders placed during the hospital encounter of 09/07/12 (from  the past 48 hour(s))  CBC WITH DIFFERENTIAL     Status: Abnormal   Collection Time    09/07/12 10:44 PM      Result Value Range   WBC 10.7 (*) 4.0 - 10.5 K/uL   RBC 4.67  4.22 - 5.81 MIL/uL   Hemoglobin 13.2  13.0 - 17.0 g/dL   HCT 29.5  28.4 - 13.2 %   MCV 83.9  78.0 - 100.0 fL   MCH 28.3  26.0 - 34.0 pg   MCHC 33.7  30.0 - 36.0 g/dL   RDW 44.0  10.2 - 72.5 %   Platelets 283  150 - 400 K/uL   Neutrophils Relative 77  43 - 77 %   Neutro Abs 8.3 (*) 1.7 - 7.7 K/uL   Lymphocytes Relative 15  12 - 46 %    Lymphs Abs 1.6  0.7 - 4.0 K/uL   Monocytes Relative 6  3 - 12 %   Monocytes Absolute 0.6  0.1 - 1.0 K/uL   Eosinophils Relative 2  0 - 5 %   Eosinophils Absolute 0.2  0.0 - 0.7 K/uL   Basophils Relative 0  0 - 1 %   Basophils Absolute 0.0  0.0 - 0.1 K/uL  BASIC METABOLIC PANEL     Status: Abnormal   Collection Time    09/07/12 10:44 PM      Result Value Range   Sodium 137  135 - 145 mEq/L   Potassium 5.3 (*) 3.5 - 5.1 mEq/L   Chloride 94 (*) 96 - 112 mEq/L   CO2 26  19 - 32 mEq/L   Glucose, Bld 173 (*) 70 - 99 mg/dL   BUN 62 (*) 6 - 23 mg/dL   Creatinine, Ser 3.66 (*) 0.50 - 1.35 mg/dL   Calcium 44.0  8.4 - 34.7 mg/dL   GFR calc non Af Amer 5 (*) >90 mL/min   GFR calc Af Amer 6 (*) >90 mL/min   Comment:            The eGFR has been calculated     using the CKD EPI equation.     This calculation has not been     validated in all clinical     situations.     eGFR's persistently     <90 mL/min signify     possible Chronic Kidney Disease.  GLUCOSE, CAPILLARY     Status: Abnormal   Collection Time    09/08/12  4:47 AM      Result Value Range   Glucose-Capillary 136 (*) 70 - 99 mg/dL   Comment 1 Notify RN    MAGNESIUM     Status: None   Collection Time    09/08/12  5:47 AM      Result Value Range   Magnesium 2.0  1.5 - 2.5 mg/dL  PHOSPHORUS     Status: Abnormal   Collection Time    09/08/12  5:47 AM      Result Value Range   Phosphorus 6.6 (*) 2.3 - 4.6 mg/dL  COMPREHENSIVE METABOLIC PANEL     Status: Abnormal   Collection Time    09/08/12  5:47 AM      Result Value Range   Sodium 135  135 - 145 mEq/L   Potassium 4.5  3.5 - 5.1 mEq/L   Chloride 95 (*) 96 - 112 mEq/L   CO2 22  19 - 32 mEq/L   Glucose, Bld 158 (*) 70 - 99 mg/dL  BUN 64 (*) 6 - 23 mg/dL   Creatinine, Ser 1.61 (*) 0.50 - 1.35 mg/dL   Calcium 9.4  8.4 - 09.6 mg/dL   Total Protein 7.6  6.0 - 8.3 g/dL   Albumin 3.3 (*) 3.5 - 5.2 g/dL   AST 26  0 - 37 U/L   ALT 17  0 - 53 U/L   Alkaline Phosphatase 86   39 - 117 U/L   Total Bilirubin 0.2 (*) 0.3 - 1.2 mg/dL   GFR calc non Af Amer 5 (*) >90 mL/min   GFR calc Af Amer 6 (*) >90 mL/min   Comment:            The eGFR has been calculated     using the CKD EPI equation.     This calculation has not been     validated in all clinical     situations.     eGFR's persistently     <90 mL/min signify     possible Chronic Kidney Disease.  CBC     Status: Abnormal   Collection Time    09/08/12  5:47 AM      Result Value Range   WBC 9.7  4.0 - 10.5 K/uL   RBC 4.18 (*) 4.22 - 5.81 MIL/uL   Hemoglobin 11.4 (*) 13.0 - 17.0 g/dL   HCT 04.5 (*) 40.9 - 81.1 %   MCV 83.7  78.0 - 100.0 fL   MCH 27.3  26.0 - 34.0 pg   MCHC 32.6  30.0 - 36.0 g/dL   RDW 91.4  78.2 - 95.6 %   Platelets 249  150 - 400 K/uL  GLUCOSE, CAPILLARY     Status: Abnormal   Collection Time    09/08/12  7:53 AM      Result Value Range   Glucose-Capillary 205 (*) 70 - 99 mg/dL   Constitutional: negative for chills, fatigue, fevers and sweats Ears, nose, mouth, throat, and face: negative for earaches, hearing loss, hoarseness, nasal congestion and sore throat Respiratory: negative for cough, dyspnea on exertion, hemoptysis and sputum Cardiovascular: negative for chest pain, chest pressure/discomfort, orthopnea and palpitations Gastrointestinal: negative for abdominal pain, change in bowel habits, nausea and vomiting Genitourinary:negative, oliguric Musculoskeletal:negative for arthralgias, back pain, muscle weakness and myalgias, positive for mild left lower leg pain Neurological: negative for dizziness, headaches, paresthesia and weakness  Physical Exam: Filed Vitals:   09/08/12 0445  BP: 163/87  Pulse: 85  Temp: 98.2 F (36.8 C)  Resp: 16     General appearance: alert, cooperative and no distress Head: Normocephalic, without obvious abnormality, atraumatic Neck: no adenopathy, no carotid bruit, no JVD and supple, symmetrical, trachea midline Resp: clear to  auscultation bilaterally Cardio: regular rate and rhythm, S1, S2 normal, no murmur, click, rub or gallop GI: soft, non-tender; bowel sounds normal; no masses,  no organomegaly Extremities: left lower leg stabilized, no edema Neurologic: Grossly normal Dialysis Access: Right IJ catheter, left BVT (placed 06/14/12 by Dr. Edilia Bo) with + bruit  Assessment/Plan: 1. Displaced left tibial fx - secondary to fall, currently stable with splint; surgical repair tomorrow per Dr. Dion Saucier. 2. ESRD -  HD on TTS @ [place]; K 4.5 today.  HD pending today. 3. Hypertension/volume  - BP 163/87 most recently, on Amlodipine 10 mg qd and Labetalol 300 mg bid; current wt 104 kg with EDW 99 kg.  UF goal of 4-5 L today. 4. Anemia  - Hgb 11.4, no outpatient Epogen, Venofer 50 mg on Thurs.  5. Metabolic bone disease - Ca 9.4 (10 corrected), P 6.6; Hectorol 6 mcg, Phoslo 2 with meals. 6. Nutrition - Alb 3.3, renal diet. 7. DM - on insulin per primary. 8. Hx CVA - in 07/2009. 9. Hx nephrolithiasis  LYLES,CHARLES 09/08/2012, 11:50 AM   Attending Nephrologist: Delano Metz, MD  Patient seen and examined.  Agree with assessment and plan as above. Unfortunate fall while shoveling ice/snow on his porch today, sustaining a     R tibial fracture. Plan HD today and will go to OR tomorrow per ortho physicians. Electrolytes and volume status are stable.  Vinson Moselle  MD [phone] pgr    [phone] cell 09/08/2012, 3:35 PM

## 2012-09-08 NOTE — Progress Notes (Signed)
Utilization review completed.  

## 2012-09-08 NOTE — Progress Notes (Signed)
Patient seen and examined Admitted with tibial fracture Awaiting ORIF ESRD on HD per renal  Oscar Castillo

## 2012-09-09 ENCOUNTER — Encounter (HOSPITAL_COMMUNITY): Payer: Self-pay | Admitting: Anesthesiology

## 2012-09-09 ENCOUNTER — Encounter (HOSPITAL_COMMUNITY)
Admission: EM | Disposition: A | Discharge status: Rehabilitation Facility | Payer: Self-pay | Source: Home / Self Care | Attending: Internal Medicine

## 2012-09-09 ENCOUNTER — Inpatient Hospital Stay (HOSPITAL_COMMUNITY): Payer: Medicare Other | Admitting: Certified Registered Nurse Anesthetist

## 2012-09-09 ENCOUNTER — Inpatient Hospital Stay (HOSPITAL_COMMUNITY): Payer: Medicare Other

## 2012-09-09 ENCOUNTER — Encounter (HOSPITAL_COMMUNITY): Payer: Self-pay | Admitting: Certified Registered Nurse Anesthetist

## 2012-09-09 DIAGNOSIS — N320 Bladder-neck obstruction: Secondary | ICD-10-CM | POA: Diagnosis not present

## 2012-09-09 DIAGNOSIS — N2581 Secondary hyperparathyroidism of renal origin: Secondary | ICD-10-CM | POA: Diagnosis not present

## 2012-09-09 DIAGNOSIS — IMO0002 Reserved for concepts with insufficient information to code with codable children: Secondary | ICD-10-CM | POA: Diagnosis not present

## 2012-09-09 DIAGNOSIS — I5043 Acute on chronic combined systolic (congestive) and diastolic (congestive) heart failure: Secondary | ICD-10-CM | POA: Diagnosis not present

## 2012-09-09 DIAGNOSIS — K219 Gastro-esophageal reflux disease without esophagitis: Secondary | ICD-10-CM | POA: Diagnosis not present

## 2012-09-09 DIAGNOSIS — S82209A Unspecified fracture of shaft of unspecified tibia, initial encounter for closed fracture: Secondary | ICD-10-CM | POA: Diagnosis not present

## 2012-09-09 DIAGNOSIS — M549 Dorsalgia, unspecified: Secondary | ICD-10-CM | POA: Diagnosis not present

## 2012-09-09 DIAGNOSIS — I120 Hypertensive chronic kidney disease with stage 5 chronic kidney disease or end stage renal disease: Secondary | ICD-10-CM | POA: Diagnosis not present

## 2012-09-09 DIAGNOSIS — N186 End stage renal disease: Secondary | ICD-10-CM | POA: Diagnosis not present

## 2012-09-09 DIAGNOSIS — D638 Anemia in other chronic diseases classified elsewhere: Secondary | ICD-10-CM

## 2012-09-09 DIAGNOSIS — N039 Chronic nephritic syndrome with unspecified morphologic changes: Secondary | ICD-10-CM | POA: Diagnosis not present

## 2012-09-09 DIAGNOSIS — S82409A Unspecified fracture of shaft of unspecified fibula, initial encounter for closed fracture: Secondary | ICD-10-CM | POA: Diagnosis not present

## 2012-09-09 DIAGNOSIS — I5041 Acute combined systolic (congestive) and diastolic (congestive) heart failure: Secondary | ICD-10-CM

## 2012-09-09 DIAGNOSIS — S82899A Other fracture of unspecified lower leg, initial encounter for closed fracture: Secondary | ICD-10-CM | POA: Diagnosis not present

## 2012-09-09 DIAGNOSIS — S8263XA Displaced fracture of lateral malleolus of unspecified fibula, initial encounter for closed fracture: Secondary | ICD-10-CM | POA: Diagnosis not present

## 2012-09-09 HISTORY — PX: TIBIA IM NAIL INSERTION: SHX2516

## 2012-09-09 HISTORY — PX: ORIF FIBULA FRACTURE: SHX5114

## 2012-09-09 LAB — PROTIME-INR: INR: 1.01 (ref 0.00–1.49)

## 2012-09-09 LAB — CBC WITH DIFFERENTIAL/PLATELET
Basophils Relative: 0 % (ref 0–1)
Eosinophils Absolute: 0.1 10*3/uL (ref 0.0–0.7)
Eosinophils Relative: 2 % (ref 0–5)
HCT: 35.8 % — ABNORMAL LOW (ref 39.0–52.0)
Hemoglobin: 11.6 g/dL — ABNORMAL LOW (ref 13.0–17.0)
MCH: 27.4 pg (ref 26.0–34.0)
MCHC: 32.4 g/dL (ref 30.0–36.0)
Monocytes Absolute: 0.6 10*3/uL (ref 0.1–1.0)
Monocytes Relative: 10 % (ref 3–12)
RDW: 14.5 % (ref 11.5–15.5)

## 2012-09-09 LAB — BASIC METABOLIC PANEL
BUN: 41 mg/dL — ABNORMAL HIGH (ref 6–23)
Calcium: 9.2 mg/dL (ref 8.4–10.5)
Chloride: 96 mEq/L (ref 96–112)
Creatinine, Ser: 8.05 mg/dL — ABNORMAL HIGH (ref 0.50–1.35)
GFR calc Af Amer: 7 mL/min — ABNORMAL LOW (ref >90)
GFR calc non Af Amer: 6 mL/min — ABNORMAL LOW (ref >90)

## 2012-09-09 LAB — CBC
MCH: 28.1 pg (ref 26.0–34.0)
MCHC: 32.9 g/dL (ref 30.0–36.0)
MCV: 85.4 fL (ref 78.0–100.0)
Platelets: 226 10*3/uL (ref 150–400)
RDW: 14.6 % (ref 11.5–15.5)

## 2012-09-09 LAB — GLUCOSE, CAPILLARY
Glucose-Capillary: 164 mg/dL — ABNORMAL HIGH (ref 70–99)
Glucose-Capillary: 152 mg/dL — ABNORMAL HIGH (ref 70–99)
Glucose-Capillary: 118 mg/dL — ABNORMAL HIGH (ref 70–99)

## 2012-09-09 LAB — CREATININE, SERUM: Creatinine, Ser: 8.92 mg/dL — ABNORMAL HIGH (ref 0.50–1.35)

## 2012-09-09 SURGERY — INSERTION, INTRAMEDULLARY ROD, TIBIA
Anesthesia: General | Site: Leg Lower | Laterality: Left | Wound class: Clean

## 2012-09-09 MED ORDER — 0.9 % SODIUM CHLORIDE (POUR BTL) OPTIME
TOPICAL | Status: DC | PRN
  Administered 2012-09-09: 1000 mL

## 2012-09-09 MED ORDER — PROMETHAZINE HCL 25 MG/ML IJ SOLN: 6.2500 mg | INTRAMUSCULAR | Status: DC | PRN

## 2012-09-09 MED ORDER — MAGNESIUM CITRATE PO SOLN
1.0000 | Freq: Once | ORAL | Status: AC | PRN
  Filled 2012-09-09: qty 296

## 2012-09-09 MED ORDER — SENNA-DOCUSATE SODIUM 8.6-50 MG PO TABS: 1.0000 | ORAL_TABLET | Freq: Every day | ORAL | Status: DC

## 2012-09-09 MED ORDER — HEPARIN SODIUM (PORCINE) 1000 UNIT/ML DIALYSIS
1000.0000 [IU] | INTRAMUSCULAR | Status: DC | PRN
  Filled 2012-09-09: qty 1

## 2012-09-09 MED ORDER — LIDOCAINE HCL (PF) 1 % IJ SOLN: 5.0000 mL | INTRAMUSCULAR | Status: DC | PRN

## 2012-09-09 MED ORDER — LIDOCAINE HCL (CARDIAC) 20 MG/ML IV SOLN
INTRAVENOUS | Status: DC | PRN
  Administered 2012-09-09: 70 mg via INTRAVENOUS

## 2012-09-09 MED ORDER — WARFARIN VIDEO: Freq: Once | Status: DC

## 2012-09-09 MED ORDER — PROPOFOL 10 MG/ML IV BOLUS
INTRAVENOUS | Status: DC | PRN
  Administered 2012-09-09: 150 mg via INTRAVENOUS

## 2012-09-09 MED ORDER — NEPRO/CARBSTEADY PO LIQD
237.0000 mL | ORAL | Status: DC | PRN
  Filled 2012-09-09: qty 237

## 2012-09-09 MED ORDER — MIDAZOLAM HCL 5 MG/5ML IJ SOLN
INTRAMUSCULAR | Status: DC | PRN
  Administered 2012-09-09: 1 mg via INTRAVENOUS

## 2012-09-09 MED ORDER — HYDROMORPHONE HCL PF 1 MG/ML IJ SOLN
0.2500 mg | INTRAMUSCULAR | Status: DC | PRN
  Administered 2012-09-09: 0.5 mg via INTRAVENOUS

## 2012-09-09 MED ORDER — NEOSTIGMINE METHYLSULFATE 1 MG/ML IJ SOLN
INTRAMUSCULAR | Status: DC | PRN
  Administered 2012-09-09: 3 mg via INTRAVENOUS

## 2012-09-09 MED ORDER — ALTEPLASE 2 MG IJ SOLR
2.0000 mg | Freq: Once | INTRAMUSCULAR | Status: AC | PRN
  Filled 2012-09-09: qty 2

## 2012-09-09 MED ORDER — HEPARIN SODIUM (PORCINE) 5000 UNIT/ML IJ SOLN
5000.0000 [IU] | Freq: Three times a day (TID) | INTRAMUSCULAR | Status: DC
  Administered 2012-09-09 – 2012-09-12 (×10): 5000 [IU] via SUBCUTANEOUS
  Filled 2012-09-09 (×13): qty 1

## 2012-09-09 MED ORDER — OXYCODONE HCL 5 MG/5ML PO SOLN: 5.0000 mg | Freq: Once | ORAL | Status: DC | PRN

## 2012-09-09 MED ORDER — PHENOL 1.4 % MT LIQD: 1.0000 | OROMUCOSAL | Status: DC | PRN

## 2012-09-09 MED ORDER — HEPARIN SODIUM (PORCINE) 1000 UNIT/ML DIALYSIS
20.0000 [IU]/kg | INTRAMUSCULAR | Status: DC | PRN
  Filled 2012-09-09: qty 3

## 2012-09-09 MED ORDER — SORBITOL 70 % SOLN: 30.0000 mL | Freq: Every day | Status: DC | PRN

## 2012-09-09 MED ORDER — WARFARIN SODIUM 7.5 MG PO TABS
7.5000 mg | ORAL_TABLET | Freq: Once | ORAL | Status: AC
  Administered 2012-09-10: 7.5 mg via ORAL
  Filled 2012-09-09: qty 1

## 2012-09-09 MED ORDER — ARTIFICIAL TEARS OP OINT
TOPICAL_OINTMENT | OPHTHALMIC | Status: DC | PRN
  Administered 2012-09-09: 1 via OPHTHALMIC

## 2012-09-09 MED ORDER — HYDROCODONE-ACETAMINOPHEN 5-325 MG PO TABS
1.0000 | ORAL_TABLET | Freq: Four times a day (QID) | ORAL | Status: DC | PRN
  Administered 2012-09-09 – 2012-09-11 (×4): 2 via ORAL
  Administered 2012-09-11: 1 via ORAL
  Filled 2012-09-09: qty 1
  Filled 2012-09-09 (×4): qty 2

## 2012-09-09 MED ORDER — PENTAFLUOROPROP-TETRAFLUOROETH EX AERO: 1.0000 "application " | INHALATION_SPRAY | CUTANEOUS | Status: DC | PRN

## 2012-09-09 MED ORDER — WARFARIN SODIUM 5 MG PO TABS: 5.0000 mg | ORAL_TABLET | Freq: Every day | ORAL | Status: DC

## 2012-09-09 MED ORDER — SODIUM CHLORIDE 0.9 % IV SOLN: 100.0000 mL | INTRAVENOUS | Status: DC | PRN

## 2012-09-09 MED ORDER — MEPERIDINE HCL 25 MG/ML IJ SOLN: 6.2500 mg | INTRAMUSCULAR | Status: DC | PRN

## 2012-09-09 MED ORDER — PATIENT'S GUIDE TO USING COUMADIN BOOK
Freq: Once | Status: DC
  Filled 2012-09-09: qty 1

## 2012-09-09 MED ORDER — LIDOCAINE-PRILOCAINE 2.5-2.5 % EX CREA
1.0000 "application " | TOPICAL_CREAM | CUTANEOUS | Status: DC | PRN
  Filled 2012-09-09: qty 5

## 2012-09-09 MED ORDER — POTASSIUM CHLORIDE IN NACL 20-0.45 MEQ/L-% IV SOLN
INTRAVENOUS | Status: DC
  Filled 2012-09-09 (×3): qty 1000

## 2012-09-09 MED ORDER — HYDROMORPHONE HCL PF 1 MG/ML IJ SOLN
INTRAMUSCULAR | Status: AC
  Administered 2012-09-09: 0.5 mg via INTRAVENOUS
  Filled 2012-09-09: qty 1

## 2012-09-09 MED ORDER — POLYETHYLENE GLYCOL 3350 17 G PO PACK
17.0000 g | PACK | Freq: Every day | ORAL | Status: DC | PRN
  Administered 2012-09-12: 17 g via ORAL

## 2012-09-09 MED ORDER — FENTANYL CITRATE 0.05 MG/ML IJ SOLN
INTRAMUSCULAR | Status: DC | PRN
  Administered 2012-09-09: 100 ug via INTRAVENOUS
  Administered 2012-09-09 (×3): 50 ug via INTRAVENOUS

## 2012-09-09 MED ORDER — OXYCODONE HCL 5 MG PO TABS: 5.0000 mg | ORAL_TABLET | Freq: Once | ORAL | Status: DC | PRN

## 2012-09-09 MED ORDER — METOCLOPRAMIDE HCL 10 MG PO TABS: 5.0000 mg | ORAL_TABLET | Freq: Three times a day (TID) | ORAL | Status: DC | PRN

## 2012-09-09 MED ORDER — GLYCOPYRROLATE 0.2 MG/ML IJ SOLN
INTRAMUSCULAR | Status: DC | PRN
  Administered 2012-09-09: 0.4 mg via INTRAVENOUS

## 2012-09-09 MED ORDER — METOCLOPRAMIDE HCL 5 MG/ML IJ SOLN: 5.0000 mg | Freq: Three times a day (TID) | INTRAMUSCULAR | Status: DC | PRN

## 2012-09-09 MED ORDER — EPHEDRINE SULFATE 50 MG/ML IJ SOLN
INTRAMUSCULAR | Status: DC | PRN
  Administered 2012-09-09 (×3): 5 mg via INTRAVENOUS

## 2012-09-09 MED ORDER — PHENYLEPHRINE HCL 10 MG/ML IJ SOLN
INTRAMUSCULAR | Status: DC | PRN
  Administered 2012-09-09: 80 ug via INTRAVENOUS
  Administered 2012-09-09: 40 ug via INTRAVENOUS
  Administered 2012-09-09: 80 ug via INTRAVENOUS
  Administered 2012-09-09: 40 ug via INTRAVENOUS
  Administered 2012-09-09 (×2): 80 ug via INTRAVENOUS

## 2012-09-09 MED ORDER — MENTHOL 3 MG MT LOZG: 1.0000 | LOZENGE | OROMUCOSAL | Status: DC | PRN

## 2012-09-09 MED ORDER — ALUM & MAG HYDROXIDE-SIMETH 200-200-20 MG/5ML PO SUSP: 30.0000 mL | ORAL | Status: DC | PRN

## 2012-09-09 MED ORDER — SENNA 8.6 MG PO TABS
1.0000 | ORAL_TABLET | Freq: Two times a day (BID) | ORAL | Status: DC
  Administered 2012-09-09 – 2012-09-12 (×6): 8.6 mg via ORAL
  Filled 2012-09-09 (×8): qty 1

## 2012-09-09 MED ORDER — WARFARIN - PHARMACIST DOSING INPATIENT: Freq: Every day | Status: DC

## 2012-09-09 MED ORDER — ROCURONIUM BROMIDE 100 MG/10ML IV SOLN
INTRAVENOUS | Status: DC | PRN
  Administered 2012-09-09: 5 mg via INTRAVENOUS
  Administered 2012-09-09: 40 mg via INTRAVENOUS
  Administered 2012-09-09: 5 mg via INTRAVENOUS

## 2012-09-09 MED ORDER — ONDANSETRON HCL 4 MG/2ML IJ SOLN
INTRAMUSCULAR | Status: DC | PRN
  Administered 2012-09-09: 4 mg via INTRAVENOUS

## 2012-09-09 MED ORDER — PHENYLEPHRINE HCL 10 MG/ML IJ SOLN
10.0000 mg | INTRAVENOUS | Status: DC | PRN
  Administered 2012-09-09: 10 ug/min via INTRAVENOUS

## 2012-09-09 MED ORDER — HYDROCODONE-ACETAMINOPHEN 10-325 MG PO TABS: 1.0000 | ORAL_TABLET | Freq: Four times a day (QID) | ORAL | Status: DC | PRN

## 2012-09-09 SURGICAL SUPPLY — 76 items
BANDAGE ELASTIC 4 VELCRO ST LF (GAUZE/BANDAGES/DRESSINGS) ×3 IMPLANT
BANDAGE ELASTIC 6 VELCRO ST LF (GAUZE/BANDAGES/DRESSINGS) ×3 IMPLANT
BANDAGE ESMARK 6X9 LF (GAUZE/BANDAGES/DRESSINGS) IMPLANT
BANDAGE GAUZE ELAST BULKY 4 IN (GAUZE/BANDAGES/DRESSINGS) ×3 IMPLANT
BIT DRILL 2.5X2.75 QC CALB (BIT) ×1 IMPLANT
BIT DRILL 3.8X6 NS (BIT) ×1 IMPLANT
BIT DRILL 4.4 NS (BIT) ×1 IMPLANT
BLADE SURG 15 STRL LF DISP TIS (BLADE) ×2 IMPLANT
BLADE SURG 15 STRL SS (BLADE) ×3
BLADE SURG ROTATE 9660 (MISCELLANEOUS) IMPLANT
BNDG CMPR 9X6 STRL LF SNTH (GAUZE/BANDAGES/DRESSINGS) ×2
BNDG COHESIVE 6X5 TAN STRL LF (GAUZE/BANDAGES/DRESSINGS) ×3 IMPLANT
BNDG ESMARK 6X9 LF (GAUZE/BANDAGES/DRESSINGS) ×3
BOOTCOVER CLEANROOM LRG (PROTECTIVE WEAR) ×6 IMPLANT
CANISTER SUCTION 2500CC (MISCELLANEOUS) ×1 IMPLANT
CLOTH BEACON ORANGE TIMEOUT ST (SAFETY) ×3 IMPLANT
COVER SURGICAL LIGHT HANDLE (MISCELLANEOUS) ×5 IMPLANT
CUFF TOURNIQUET SINGLE 34IN LL (TOURNIQUET CUFF) ×1 IMPLANT
CUFF TOURNIQUET SINGLE 44IN (TOURNIQUET CUFF) IMPLANT
DRAPE C-ARM 42X72 X-RAY (DRAPES) ×3 IMPLANT
DRAPE C-ARMOR (DRAPES) ×1 IMPLANT
DRAPE INCISE IOBAN 66X45 STRL (DRAPES) ×1 IMPLANT
DRAPE ORTHO SPLIT 77X108 STRL (DRAPES) ×6
DRAPE PROXIMA HALF (DRAPES) ×6 IMPLANT
DRAPE SURG ORHT 6 SPLT 77X108 (DRAPES) ×4 IMPLANT
DRAPE U-SHAPE 47X51 STRL (DRAPES) ×4 IMPLANT
DRSG ADAPTIC 3X8 NADH LF (GAUZE/BANDAGES/DRESSINGS) ×3 IMPLANT
DURAPREP 26ML APPLICATOR (WOUND CARE) ×4 IMPLANT
ELECT REM PT RETURN 9FT ADLT (ELECTROSURGICAL) ×3
ELECTRODE REM PT RTRN 9FT ADLT (ELECTROSURGICAL) ×2 IMPLANT
FACESHIELD LNG OPTICON STERILE (SAFETY) IMPLANT
GLOVE BIOGEL PI ORTHO PRO SZ8 (GLOVE) ×1
GLOVE ORTHO TXT STRL SZ7.5 (GLOVE) ×3 IMPLANT
GLOVE PI ORTHO PRO STRL SZ8 (GLOVE) ×2 IMPLANT
GLOVE SURG ORTHO 8.0 STRL STRW (GLOVE) ×6 IMPLANT
GOWN STRL NON-REIN LRG LVL3 (GOWN DISPOSABLE) IMPLANT
GUIDEWIRE BALL NOSE 80CM (WIRE) ×1 IMPLANT
K-WIRE ACE 1.6X6 (WIRE) ×3
KIT BASIN OR (CUSTOM PROCEDURE TRAY) ×3 IMPLANT
KIT ROOM TURNOVER OR (KITS) ×3 IMPLANT
KWIRE ACE 1.6X6 (WIRE) IMPLANT
MANIFOLD NEPTUNE II (INSTRUMENTS) ×2 IMPLANT
NAIL IM TIBIAL 10X34.5 (Orthopedic Implant) IMPLANT
NS IRRIG 1000ML POUR BTL (IV SOLUTION) ×3 IMPLANT
PACK GENERAL/GYN (CUSTOM PROCEDURE TRAY) ×3 IMPLANT
PAD ARMBOARD 7.5X6 YLW CONV (MISCELLANEOUS) ×6 IMPLANT
PIN GUIDE ACE (PIN) ×1 IMPLANT
PLATE ACE 100DEG 7HOLE (Plate) ×1 IMPLANT
SCREW ACECAP 38MM (Screw) ×1 IMPLANT
SCREW ACECAP 48MM (Screw) ×1 IMPLANT
SCREW CORTICAL 3.5MM  16MM (Screw) ×3 IMPLANT
SCREW CORTICAL 3.5MM 16MM (Screw) IMPLANT
SCREW CORTICAL 3.5MM 22MM (Screw) ×1 IMPLANT
SCREW NLOCK CANC HEX 4X20 (Screw) ×6 IMPLANT
SCREW NLOCK CANC HEX 4X20 FIB (Screw) IMPLANT
SCREW PROXIMAL 4.5MMX18MM (Screw) ×1 IMPLANT
SCREW PROXIMAL DEPUY (Screw) ×6 IMPLANT
SCREW PRXML FT 55X5.5XNS TIB (Screw) IMPLANT
SCREW PRXML FT 60X5.5XNS LF (Screw) IMPLANT
SPONGE GAUZE 4X4 12PLY (GAUZE/BANDAGES/DRESSINGS) ×6 IMPLANT
STAPLER VISISTAT 35W (STAPLE) ×3 IMPLANT
STOCKINETTE IMPERVIOUS LG (DRAPES) ×3 IMPLANT
SUCTION FRAZIER TIP 10 FR DISP (SUCTIONS) ×1 IMPLANT
SUT MNCRL AB 4-0 PS2 18 (SUTURE) ×4 IMPLANT
SUT VIC AB 0 CT1 18XCR BRD 8 (SUTURE) ×2 IMPLANT
SUT VIC AB 0 CT1 8-18 (SUTURE) ×6
SUT VIC AB 1 CTX 36 (SUTURE) ×3
SUT VIC AB 1 CTX36XBRD ANBCTR (SUTURE) IMPLANT
SUT VIC AB 2-0 CT1 27 (SUTURE) ×3
SUT VIC AB 2-0 CT1 TAPERPNT 27 (SUTURE) ×2 IMPLANT
SUT VIC AB 3-0 SH 8-18 (SUTURE) ×4 IMPLANT
TIBIAL NAIL 10X34.5 (Orthopedic Implant) ×3 IMPLANT
TOWEL OR 17X24 6PK STRL BLUE (TOWEL DISPOSABLE) ×3 IMPLANT
TOWEL OR 17X26 10 PK STRL BLUE (TOWEL DISPOSABLE) ×5 IMPLANT
TRAY FOLEY CATH 14FR (SET/KITS/TRAYS/PACK) IMPLANT
WATER STERILE IRR 1000ML POUR (IV SOLUTION) ×2 IMPLANT

## 2012-09-09 NOTE — Op Note (Signed)
09/07/2012 - 09/09/2012  1:01 PM  PATIENT:  Oscar Castillo    PRE-OPERATIVE DIAGNOSIS:  left tibia and distal fibula fractures  POST-OPERATIVE DIAGNOSIS:  Same  PROCEDURE:  INTRAMEDULLARY (IM) NAIL TIBIAL, OPEN REDUCTION INTERNAL FIXATION (ORIF) FIBULA FRACTURE  SURGEON:  Eulas Post, MD  PHYSICIAN ASSISTANT: Janace Litten, OPA-C, present and scrubbed throughout the case, critical for completion in a timely fashion, and for retraction, instrumentation, and closure.  ANESTHESIA:   General  PREOPERATIVE INDICATIONS:  Oscar Castillo is a  62 y.o. male with a diagnosis of left tibia and distal fibula fractures who elected for surgical management.  He has end-stage renal disease, on dialysis, and I was concerned that cast treatment of a comminuted tibia and fibula fracture would not be reasonable given the risk for skin breakdown, inability to monitor the skin, as well as a high risk for malunion, and nonunion as well.  The risks benefits and alternatives were discussed with the patient preoperatively including but not limited to the risks of infection, bleeding, nerve injury, cardiopulmonary complications, the need for revision surgery, among others, and the patient was willing to proceed.  OPERATIVE IMPLANTS: Biomet one third tubular plate, 7 hole, with 3 distal interlocking screws, 3 proximal screws. I used a Biomet tibial nail, with 2 proximal interlocking bolts and 2 distal interlocking bolts. The nail was 34.5 cm x 10 mm.  OPERATIVE FINDINGS: Substantial comminution and shortening of the fibula fracture. There was about 4 pieces, possibly 5 to the fibula. The tibia was also grossly unstable with a moderately long segmental portion, particularly posteriorly.  OPERATIVE PROCEDURE: The patient was brought to the operating room and placed in the supine position. General anesthesia was administered. IV Ancef was given. The left lower extremity was prepped and draped in usual sterile fashion.  Time out was performed. The leg was elevated and exsanguinated and the tourniquet was inflated. Incision was made over the distal fibula, and dissection was carried down and the fibular segment exposed. This was extremely comminuted. I was ultimately able to get this reduced nearly anatomically and then held the 2 main fracture fragments at the appropriate length using a K wire.  I then applied a one third tubular plate, after confirming the length of the fibula. His fibula appear remarkably long, however I had anatomic reduction of the segments, and I asked he took a comparison view of the contralateral side which demonstrated a significantly long fibula as well. Therefore I was satisfied that I had restored length, and addition the tibia was anatomically reduced at this point as well, which added confirmation of the length.  After securing the fracture proximally and distally, turned my attention to the tibia. Of note, one of the small splinters of bone was captured with the third screw from the bottom, however the hold was not particularly good because of this is very small, and ultimately I think that this fractures off during the course of the tibial instrumentation.  Nonetheless I had excellent bony contact along the length of the fibula.  I then made an incision over the patellar tendon, dissected down and used a patellar tendon splitting approach. A guidewire was introduced into the appropriate position and confirmed on AP and lateral views, and then a reamer was used to open the proximal tibia taking care to protect the tendon with the reamer sleeve.  I then introduced a ball-tipped guidewire down the length of the tibia, across the fracture site, and took care to hold the  fracture as closely in anatomic position as possible.  I then reamed sequentially, up to a 11.5, and then placed the nail across the fracture site. Every attempt was made to prevent varus. I secured the nail proximally with  interlocking bolts, and then distally using perfect circles. Excellent overall fixation was achieved. A small segment of anterior bone of the fibula was tucked into the fracture site, at completion of the case, although could not really be held due to its small size. I debated using a cerclage suture, however I was concerned because I could not control or know exactly where his peroneal artery was located, and given his peripheral vascular disease I did not want to potentially cerclage the artery were causing undue damage to compromise of the foot. Therefore I did not place a cerclage suture. I irrigated all the wounds copiously, and repaired the patellar tendon split with Vicryl, followed by Vicryl for the subcutaneous tissue, with routine closure for the skin. Steri-Strips and sterile gauze were applied, followed by a posterior splint. His wounds are also injected. He had the tourniquet released, at approximately 2 hours, and he returned to the PACU in stable and satisfactory condition. There no complications and he tolerated the procedure well. He'll be nonweightbearing for a period of 6 weeks, and he will probably need DVT prophylaxis for 4 weeks.

## 2012-09-09 NOTE — Progress Notes (Signed)
Subjective: No complaints, for surgery today  Objective Vital signs in last 24 hours: Filed Vitals:   09/09/12 1400 09/09/12 1415 09/09/12 1445 09/09/12 1500  BP:  162/81  168/85  Pulse: 98   92  Temp:   98.2 F (36.8 C) 98 F (36.7 C)  TempSrc:      Resp: 14   18  Height:      Weight:      SpO2: 99%   100%   Weight change:   Intake/Output Summary (Last 24 hours) at 09/09/12 1818 Last data filed at 09/09/12 1323  Gross per 24 hour  Intake    400 ml  Output    325 ml  Net     75 ml   Labs: Basic Metabolic Panel:  Recent Labs Lab 09/07/12 2244 09/08/12 0547 09/09/12 0710 09/09/12 1605  NA 137 135 134*  --   K 5.3* 4.5 4.7  --   CL 94* 95* 96  --   CO2 26 22 25   --   GLUCOSE 173* 158* 174*  --   BUN 62* 64* 41*  --   CREATININE 9.59* 9.97* 8.05* 8.92*  CALCIUM 10.0 9.4 9.2  --   PHOS  --  6.6*  --   --    Liver Function Tests:  Recent Labs Lab 09/08/12 0547  AST 26  ALT 17  ALKPHOS 86  BILITOT 0.2*  PROT 7.6  ALBUMIN 3.3*   No results found for this basename: LIPASE, AMYLASE,  in the last 168 hours No results found for this basename: AMMONIA,  in the last 168 hours CBC:  Recent Labs Lab 09/07/12 2244 09/08/12 0547 09/09/12 0710 09/09/12 1605  WBC 10.7* 9.7 6.0 6.7  NEUTROABS 8.3*  --  3.4  --   HGB 13.2 11.4* 11.6* 11.7*  HCT 39.2 35.0* 35.8* 35.6*  MCV 83.9 83.7 84.4 85.4  PLT 283 249 222 226   PT/INR: @LABRCNTIP (inr:5) Cardiac Enzymes: )No results found for this basename: CKTOTAL, CKMB, CKMBINDEX, TROPONINI,  in the last 168 hours CBG:  Recent Labs Lab 09/08/12 2229 09/09/12 0555 09/09/12 0759 09/09/12 1351 09/09/12 1606  GLUCAP 170* 164* 178* 146* 152*    Iron Studies: No results found for this basename: IRON, TIBC, TRANSFERRIN, FERRITIN,  in the last 168 hours  Scheduled Meds . amLODipine  10 mg Oral Daily  . brimonidine  1 drop Both Eyes BID  . brinzolamide  1 drop Both Eyes BID  . docusate sodium  100 mg Oral BID  .  [START ON 09/10/2012] doxercalciferol  6 mcg Intravenous Q T,Th,Sa-HD  . ferric gluconate (FERRLECIT/NULECIT) IV  62.5 mg Intravenous Weekly  . heparin subcutaneous  5,000 Units Subcutaneous Q8H  . insulin aspart  0-5 Units Subcutaneous QHS  . insulin aspart  0-9 Units Subcutaneous TID WC  . labetalol  300 mg Oral BID  . pantoprazole (PROTONIX) IV  40 mg Intravenous QHS  . patient's guide to using coumadin book   Does not apply Once  . senna  1 tablet Oral BID  . sodium chloride  3 mL Intravenous Q12H  . Travoprost (BAK Free)  1 drop Both Eyes QHS  . warfarin   Does not apply Once    Physical Exam:  Blood pressure 168/85, pulse 92, temperature 98 F (36.7 C), temperature source Oral, resp. rate 18, height 5\' 10"  (1.778 m), weight 104 kg (229 lb 4.5 oz), SpO2 100.00%.  Gen: Alert, no distress   Neck: no JVD Resp:  clear  Cardio: regular rate and rhythm, S1, S2 normal GI: soft, non-tender; bowel sounds normal Extremities: left lower leg stabilized, no edema  Neurologic: Grossly normal  Dialysis Access: Right IJ catheter, left BVT (placed 06/14/12 by Dr. Edilia Bo) with + bruit   Dialysis Orders: Center: [place] on TTS.  EDW 99 kg HD Bath 2K/2.25Ca Time 4 hrs Heparin 5000 U. Access Right IJ catheter, also L BVT (placed 06/14/12) BFR 400 DFR 800 Hectorol 6 mcg IV/HD Epogen 0 Units IV/HD Venofer 50 mg on Thurs.   Assessment/Plan:  1. Displaced left tibial fx - secondary to fall, currently stable with splint; surgical repair today per ortho 2. ESRD, cont hd TTS 3. Hypertension/volume - BP 163/87 most recently, on Amlodipine 10 mg qd and Labetalol 300 mg bid. UF to 99kg tomorrow or as tolerated 4. Anemia - Hgb 11.4, no outpatient Epogen, Venofer 50 mg on Thurs. 5. Metabolic bone disease - Ca 9.4 (10 corrected), P 6.6; Hectorol 6 mcg, Phoslo 2 with meals. 6. Nutrition - Alb 3.3, renal diet. 7. DM - on insulin per primary. 8. Hx CVA - in 07/2009. 9. Hx nephrolithiasis    Oscar Moselle   MD [phone] pgr    [phone] cell 09/09/2012, 9:00 AM

## 2012-09-09 NOTE — Preoperative (Signed)
Beta Blockers   Reason not to administer Beta Blockers:Not Applicable 

## 2012-09-09 NOTE — Progress Notes (Addendum)
ANTICOAGULATION CONSULT NOTE - Initial Consult  Pharmacy Consult for Warfarin Indication: VTE prophylaxis  No Known Allergies  Patient Measurements: Height: 5\' 10"  (177.8 cm) Weight: 229 lb 4.5 oz (104 kg) IBW/kg (Calculated) : 73 Heparin Dosing Weight: n/a  Vital Signs: Temp: 98 F (36.7 C) (02/14 1500) BP: 168/85 mmHg (02/14 1500) Pulse Rate: 92 (02/14 1500)  Labs:  Recent Labs  09/08/12 0547 09/09/12 0710 09/09/12 1605 09/09/12 1726  HGB 11.4* 11.6* 11.7*  --   HCT 35.0* 35.8* 35.6*  --   PLT 249 222 226  --   LABPROT  --   --   --  13.2  INR  --   --   --  1.01  CREATININE 9.97* 8.05* 8.92*  --     Estimated Creatinine Clearance: 10.5 ml/min (by C-G formula based on Cr of 8.92).   Medical History: Past Medical History  Diagnosis Date  . ALLERGIC RHINITIS 05/05/2007  . BACK PAIN, LUMBAR, CHRONIC 08/06/2009  . BENIGN PROSTATIC HYPERTROPHY 08/01/2010  . CEREBROVASCULAR ACCIDENT, HX OF 08/06/2009  . CHOLELITHIASIS 08/01/2010  . CONGESTIVE HEART FAILURE 03/18/2009  . DEPRESSION 03/18/2009  . DIABETES MELLITUS, TYPE II 03/25/2007  . ERECTILE DYSFUNCTION 03/25/2007  . GERD 03/25/2007  . HEPATITIS C, HX OF 03/25/2007  . HYPERTENSION 03/25/2007  . Morbid obesity 03/25/2007  . NEPHROLITHIASIS, HX OF 03/18/2009  . ESRD on hemodialysis 09/08/2012    ESRD due to DM/HTN. Started dialysis in November 2013. Has LUA AVF placed Jun 14 2012 which is maturing as of Feb 2014.     Medications:  Scheduled:  . amLODipine  10 mg Oral Daily  . brimonidine  1 drop Both Eyes BID  . brinzolamide  1 drop Both Eyes BID  . [COMPLETED]  ceFAZolin (ANCEF) IV  2 g Intravenous 30 min Pre-Op  . docusate sodium  100 mg Oral BID  . [START ON 09/10/2012] doxercalciferol  6 mcg Intravenous Q T,Th,Sa-HD  . ferric gluconate (FERRLECIT/NULECIT) IV  62.5 mg Intravenous Weekly  . heparin subcutaneous  5,000 Units Subcutaneous Q8H  . insulin aspart  0-5 Units Subcutaneous QHS  . insulin aspart  0-9 Units  Subcutaneous TID WC  . labetalol  300 mg Oral BID  . pantoprazole (PROTONIX) IV  40 mg Intravenous QHS  . patient's guide to using coumadin book   Does not apply Once  . senna  1 tablet Oral BID  . sodium chloride  3 mL Intravenous Q12H  . Travoprost (BAK Free)  1 drop Both Eyes QHS  . warfarin   Does not apply Once    Assessment: 62 yo male with ESRD on HD, s/p ORIF today for tibial fracture.  Pharmacy asked to begin warfarin for post-op VTE prophylaxis.  Baseline INR WNL.  Spoke with patient, no history of bleeding in the recent past, no anticoagulants PTA.  Patient thinks he was on Coumadin during previous admission in 05/2012.  Per MAR from that admission, appears only on SQ heparin for DVT prophylaxis.  Goal of Therapy:  INR 2-3 Monitor platelets by anticoagulation protocol: Yes   Plan:  1. Coumadin 7.5 mg po x 1. 2. Daily PT/INR. 3. Will order Coumadin education materials.  Tad Moore, BCPS  Clinical Pharmacist Pager [phone]  09/09/2012 6:18 PM

## 2012-09-09 NOTE — Anesthesia Preprocedure Evaluation (Signed)
Anesthesia Evaluation  Patient identified by MRN, date of birth, ID band Patient awake    Reviewed: Allergy & Precautions, H&P , NPO status , Patient's Chart, lab work & pertinent test results  History of Anesthesia Complications Negative for: history of anesthetic complications  Airway Mallampati: I  Neck ROM: Full    Dental  (+) Edentulous Upper and Edentulous Lower   Pulmonary neg pulmonary ROS,  breath sounds clear to auscultation        Cardiovascular hypertension, +CHF Rate:Normal  History LVH, congestive heart failure, treated   Neuro/Psych negative neurological ROS     GI/Hepatic GERD-  ,  Endo/Other  diabetes  Renal/GU ESRFRenal disease     Musculoskeletal negative musculoskeletal ROS (+)   Abdominal   Peds  Hematology negative hematology ROS (+)   Anesthesia Other Findings   Reproductive/Obstetrics                           Anesthesia Physical Anesthesia Plan  ASA: III  Anesthesia Plan: General   Post-op Pain Management:    Induction:   Airway Management Planned: Oral ETT  Additional Equipment:   Intra-op Plan:   Post-operative Plan: Extubation in OR  Informed Consent: I have reviewed the patients History and Physical, chart, labs and discussed the procedure including the risks, benefits and alternatives for the proposed anesthesia with the patient or authorized representative who has indicated his/her understanding and acceptance.     Plan Discussed with: CRNA and Surgeon  Anesthesia Plan Comments:         Anesthesia Quick Evaluation

## 2012-09-09 NOTE — Anesthesia Postprocedure Evaluation (Signed)
  Anesthesia Post-op Note  Patient: Oscar Castillo  Procedure(s) Performed: Procedure(s) (LRB): INTRAMEDULLARY (IM) NAIL TIBIAL (Left) OPEN REDUCTION INTERNAL FIXATION (ORIF) FIBULA FRACTURE (Left)  Patient Location: PACU  Anesthesia Type: General  Level of Consciousness: awake and alert   Airway and Oxygen Therapy: Patient Spontanous Breathing  Post-op Pain: mild  Post-op Assessment: Post-op Vital signs reviewed, Patient's Cardiovascular Status Stable, Respiratory Function Stable, Patent Airway and No signs of Nausea or vomiting  Last Vitals:  Filed Vitals:   09/09/12 1415  BP: 162/81  Pulse:   Temp:   Resp:     Post-op Vital Signs: stable   Complications: No apparent anesthesia complications

## 2012-09-09 NOTE — Transfer of Care (Signed)
Immediate Anesthesia Transfer of Care Note  Patient: Oscar Castillo  Procedure(s) Performed: Procedure(s) with comments: INTRAMEDULLARY (IM) NAIL TIBIAL (Left) - left tibial nail and open reduction internal fixation left fibula fracture OPEN REDUCTION INTERNAL FIXATION (ORIF) FIBULA FRACTURE (Left)  Patient Location: PACU  Anesthesia Type:General  Level of Consciousness: awake, alert  and oriented  Airway & Oxygen Therapy: Patient Spontanous Breathing and Patient connected to face mask oxygen  Post-op Assessment: Report given to PACU RN  Post vital signs: Reviewed and stable  Complications: No apparent anesthesia complications

## 2012-09-09 NOTE — Consult Note (Signed)
ORTHOPAEDIC CONSULTATION  REQUESTING PHYSICIAN: Sorin Luanne Bras, MD  Chief Complaint: Left tibia fracture  HPI: Oscar Castillo is a 62 y.o. male who complains of  left leg pain after he slipped on the ice on his deck. He had the acute onset severe pain, unable to walk. He also had deformity. He was seen in the emergency room, admitted to the medical service do to end-stage renal disease, severe hypertension, and he is currently on dialysis. He rates his pain currently as moderate, and says things got better with splinting and pain medications.  Past Medical History  Diagnosis Date  . ALLERGIC RHINITIS 05/05/2007  . BACK PAIN, LUMBAR, CHRONIC 08/06/2009  . BENIGN PROSTATIC HYPERTROPHY 08/01/2010  . CEREBROVASCULAR ACCIDENT, HX OF 08/06/2009  . CHOLELITHIASIS 08/01/2010  . CONGESTIVE HEART FAILURE 03/18/2009  . DEPRESSION 03/18/2009  . DIABETES MELLITUS, TYPE II 03/25/2007  . ERECTILE DYSFUNCTION 03/25/2007  . GERD 03/25/2007  . HEPATITIS C, HX OF 03/25/2007  . HYPERTENSION 03/25/2007  . Morbid obesity 03/25/2007  . NEPHROLITHIASIS, HX OF 03/18/2009  . ESRD on hemodialysis 09/08/2012    ESRD due to DM/HTN. Started dialysis in November 2013. Has LUA AVF placed Jun 14 2012 which is maturing as of Feb 2014.    Past Surgical History  Procedure Laterality Date  . Nephrectomy      partial RR  . Av fistula placement  06/14/2012    Procedure: ARTERIOVENOUS (AV) FISTULA CREATION;  Surgeon: Chuck Hint, MD;  Location: [facility] OR;  Service: Vascular;  Laterality: Left;  Left basilic vein transposition with fistula.   History   Social History  . Marital Status: Married    Spouse Name: N/A    Number of Children: N/A  . Years of Education: 16   Occupational History  . disabled due to stroke    Social History Main Topics  . Smoking status: Never Smoker   . Smokeless tobacco: None  . Alcohol Use: No  . Drug Use: No  . Sexually Active: None   Other Topics Concern  . 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 Coronary artery disease Other   . Diabetes Other    he does not think that his family has had a history of renal disease. No Known Allergies Prior to Admission medications   Medication Sig Start Date End Date Taking? Authorizing Provider  amLODipine (NORVASC) 10 MG tablet Take 1 tablet (10 mg total) by mouth daily. 05/27/11  Yes Corwin Levins, MD  aspirin 81 MG EC tablet Take 81 mg by mouth daily.     Yes Historical Provider, MD  Brinzolamide-Brimonidine [facility]) 1-0.2 % SUSP Place 1 drop into the right eye 2 (two) times daily.   Yes Historical Provider, MD  calcium acetate (PHOSLO) 667 MG capsule Take 2 capsules (1,334 mg total) by mouth 3 (three) times daily with meals. 06/21/12  Yes Sorin Luanne Bras, MD  labetalol (NORMODYNE) 300 MG tablet TAKE ONE TABLET BY MOUTH TWICE DAILY 06/22/12  Yes Corwin Levins, MD  traMADol (ULTRAM) 50 MG tablet Take 50 mg by mouth 4 (four) times daily as needed. For pain   Yes Historical Provider, MD  Travoprost, BAK Free, (TRAVATAN) 0.004 % SOLN ophthalmic solution Place 1 drop into both eyes at bedtime.   Yes Historical Provider, MD   Dg Chest  1 View  09/07/2012  *RADIOLOGY REPORT*  Clinical Data: Preoperative chest radiograph for internal fixation of left tibia / fibula fractures.  CHEST - 1 VIEW  Comparison: Chest radiograph performed 06/11/2012  Findings: The lungs are well-aerated and clear.  There is no evidence of focal opacification, pleural effusion or pneumothorax.  The cardiomediastinal silhouette is within normal limits.  No acute osseous abnormalities are seen.  The patient's right-sided dual lumen catheter is noted ending about the cavoatrial junction.  IMPRESSION: No acute cardiopulmonary process seen.   Original Report Authenticated By: Tonia Ghent, M.D.    Dg Tibia/fibula Left  09/07/2012  *RADIOLOGY  REPORT*  Clinical Data: Status post reduction of tibial and fibular fractures.  LEFT TIBIA AND FIBULA - 2 VIEW  Comparison: Left tibia / fibula radiographs performed earlier today at 09:09 p.m.  Findings: The comminuted fractures through the mid to distal tibial diaphysis and distal fibular diaphysis demonstrate mildly improved alignment.  No new fractures are seen.  The interosseous space is grossly preserved.  The ankle mortise is incompletely assessed, but appears grossly unremarkable.  An overlying splint is noted.  Evaluation of the soft tissues is limited, though diffuse vascular calcifications are seen.  Mild chondrocalcinosis is noted at the knee joint.  IMPRESSION:  1.  Mildly improved alignment of comminuted fractures through the mid to distal tibial diaphysis and distal fibular diaphysis.  No new fractures seen. 2.  Diffuse vascular calcifications noted. 3.  Mild chondrocalcinosis at the knee joint.   Original Report Authenticated By: Tonia Ghent, M.D.    Dg Tibia/fibula Left  09/07/2012  *RADIOLOGY REPORT*  Clinical Data: Fall from porch.  Lower leg injury.  LEFT TIBIA AND FIBULA - 2 VIEW  Comparison: None.  Findings: Comminuted spiral fracture of the distal tibial diaphysis is observed.  Distal fibular metadiaphyseal fracture noted.  Chondrocalcinosis is present in the menisci of the knees.  Vascular calcifications are present.  IMPRESSION:  1.  Distal tibial shaft comminuted spiral fracture. 2.  Suspected oblique fracture of the distal fibular metadiaphysis. 3.  Chondrocalcinosis.   Original Report Authenticated By: Gaylyn Rong, M.D.     Positive ROS: All other systems have been reviewed and were otherwise negative with the exception of those mentioned in the HPI and as above.  Physical Exam: General: Alert, no acute distress Cardiovascular: No pedal edema, although he has soft tissue swelling around the left leg. Respiratory: No cyanosis, no use of accessory musculature GI: No  organomegaly, abdomen is soft and non-tender Skin: No lesions in the area of chief complaint except for bruising. Neurologic: Sensation intact distally, and he denies neuropathy Psychiatric: Patient is competent for consent with normal mood and affect Lymphatic: No axillary or cervical lymphadenopathy  MUSCULOSKELETAL: Left leg has external rotation deformity. Sensation is intact at the toes. EHL and FHL are firing. The compartments feel soft to the best I can tell, although he is currently in a splint.  Assessment: Left tibia fracture, multiple risk factors including end-stage renal disease on dialysis, and hypertension with peripheral vascular disease based on calcium deposition on the x-rays.  Plan: This is an acute severe injury, which carries risk for morbidity as well as mortality. He also has significant risk for healing given his renal disease. I recommended surgical intervention with intramedullary nail, but because conservative treatment would be a fairly challenging, particularly in light of the potential for skin breakdown, which would be hard to manage in a cast.  We're to plan for intramedullary nail fixation.  We have coordinated with the nephrology service, which recommended waiting for his dialysis we completed, and in hemodynamically stabilized, which occurred yesterday, and he is seemingly optimized at this point, and we will plan to proceed.  The risks benefits and alternatives were discussed with the patient including but not limited to the risks of nonoperative treatment, versus surgical intervention including infection, bleeding, nerve injury, malunion, nonunion, the need for revision surgery, hardware prominence, hardware failure, the need for hardware removal, blood clots, cardiopulmonary complications, morbidity, mortality, among others, and they were willing to proceed.       Zion Ta P, MD Cell [phone] Pager [phone]  09/09/2012 10:03 AM

## 2012-09-09 NOTE — Progress Notes (Signed)
[facility] PROGRESS NOTE  Oscar Castillo ZOX:[id number] DOB: July 04, 1951 DOA: 09/07/2012 PCP: Oscar Barre, MD  Assessment/Plan: 1. Tibia fracture - sp ORIF 09/09/12 by Dr. Dion Castillo - to start PT/OT  2. ESRD - HD per [facility]  3. HTN - controlled with HD   Code Status: full Family Communication: wife  Disposition Plan: ?    Consultants:  [facility]  Ortho - [place]   Procedures:  orif    HPI/Subjective: Awaiting surgery   Objective: Filed Vitals:   09/08/12 1800 09/08/12 1807 09/08/12 2100 09/09/12 0627  BP: 119/76 113/76 145/89 105/72  Pulse: 83 86 88 84  Temp:  98.7 F (37.1 C) 98.6 F (37 C) 99 F (37.2 C)  TempSrc:  Oral    Resp:  16 15 17   Height:      Weight:      SpO2:  100% 99% 100%    Intake/Output Summary (Last 24 hours) at 09/09/12 0806 Last data filed at 09/09/12 0700  Gross per 24 hour  Intake      0 ml  Output   3016 ml  Net  -3016 ml   Filed Weights   09/08/12 0205  Weight: 104 kg (229 lb 4.5 oz)    Exam:   General:  axox3  Cardiovascular: rrr  Respiratory: ctab   Abdomen: soft, nt   Data Reviewed: Basic Metabolic Panel:  Recent Labs Lab 09/07/12 2244 09/08/12 0547  NA 137 135  K 5.3* 4.5  CL 94* 95*  CO2 26 22  GLUCOSE 173* 158*  BUN 62* 64*  CREATININE 9.59* 9.97*  CALCIUM 10.0 9.4  MG  --  2.0  PHOS  --  6.6*   Liver Function Tests:  Recent Labs Lab 09/08/12 0547  AST 26  ALT 17  ALKPHOS 86  BILITOT 0.2*  PROT 7.6  ALBUMIN 3.3*   No results found for this basename: LIPASE, AMYLASE,  in the last 168 hours No results found for this basename: AMMONIA,  in the last 168 hours CBC:  Recent Labs Lab 09/07/12 2244 09/08/12 0547 09/09/12 0710  WBC 10.7* 9.7 6.0  NEUTROABS 8.3*  --  3.4  HGB 13.2 11.4* 11.6*  HCT 39.2 35.0* 35.8*  MCV 83.9 83.7 84.4  PLT 283 249 222   Cardiac Enzymes: No results found for this basename: CKTOTAL, CKMB, CKMBINDEX, TROPONINI,  in the last 168 hours BNP (last 3  results)  Recent Labs  06/11/12 0916  PROBNP 6937.0*   CBG:  Recent Labs Lab 09/08/12 0447 09/08/12 0753 09/08/12 1149 09/08/12 2229 09/09/12 0555  GLUCAP 136* 205* 166* 170* 164*    No results found for this or any previous visit (from the past 240 hour(s)).   Studies: Dg Chest 1 View  09/07/2012  *RADIOLOGY REPORT*  Clinical Data: Preoperative chest radiograph for internal fixation of left tibia / fibula fractures.  CHEST - 1 VIEW  Comparison: Chest radiograph performed 06/11/2012  Findings: The lungs are well-aerated and clear.  There is no evidence of focal opacification, pleural effusion or pneumothorax.  The cardiomediastinal silhouette is within normal limits.  No acute osseous abnormalities are seen.  The patient's right-sided dual lumen catheter is noted ending about the cavoatrial junction.  IMPRESSION: No acute cardiopulmonary process seen.   Original Report Authenticated By: Oscar Castillo, M.D.    Dg Tibia/fibula Left  09/07/2012  *RADIOLOGY REPORT*  Clinical Data: Status post reduction of tibial and fibular fractures.  LEFT TIBIA AND FIBULA - 2 VIEW  Comparison:  Left tibia / fibula radiographs performed earlier today at 09:09 p.m.  Findings: The comminuted fractures through the mid to distal tibial diaphysis and distal fibular diaphysis demonstrate mildly improved alignment.  No new fractures are seen.  The interosseous space is grossly preserved.  The ankle mortise is incompletely assessed, but appears grossly unremarkable.  An overlying splint is noted.  Evaluation of the soft tissues is limited, though diffuse vascular calcifications are seen.  Mild chondrocalcinosis is noted at the knee joint.  IMPRESSION:  1.  Mildly improved alignment of comminuted fractures through the mid to distal tibial diaphysis and distal fibular diaphysis.  No new fractures seen. 2.  Diffuse vascular calcifications noted. 3.  Mild chondrocalcinosis at the knee joint.   Original Report Authenticated  By: Oscar Castillo, M.D.    Dg Tibia/fibula Left  09/07/2012  *RADIOLOGY REPORT*  Clinical Data: Fall from porch.  Lower leg injury.  LEFT TIBIA AND FIBULA - 2 VIEW  Comparison: None.  Findings: Comminuted spiral fracture of the distal tibial diaphysis is observed.  Distal fibular metadiaphyseal fracture noted.  Chondrocalcinosis is present in the menisci of the knees.  Vascular calcifications are present.  IMPRESSION:  1.  Distal tibial shaft comminuted spiral fracture. 2.  Suspected oblique fracture of the distal fibular metadiaphysis. 3.  Chondrocalcinosis.   Original Report Authenticated By: Oscar Castillo, M.D.     Scheduled Meds: . amLODipine  10 mg Oral Daily  . brimonidine  1 drop Both Eyes BID  . brinzolamide  1 drop Both Eyes BID  .  ceFAZolin (ANCEF) IV  2 g Intravenous 30 min Pre-Op  . docusate sodium  100 mg Oral BID  . [START ON 09/10/2012] doxercalciferol  6 mcg Intravenous Q T,Th,Sa-HD  . ferric gluconate (FERRLECIT/NULECIT) IV  62.5 mg Intravenous Weekly  . insulin aspart  0-5 Units Subcutaneous QHS  . insulin aspart  0-9 Units Subcutaneous TID WC  . labetalol  300 mg Oral BID  . pantoprazole (PROTONIX) IV  40 mg Intravenous QHS  . sodium chloride  3 mL Intravenous Q12H  . Travoprost (BAK Free)  1 drop Both Eyes QHS   Continuous Infusions:   Principal Problem:   Left tibial fracture Active Problems:   Acute combined systolic and diastolic heart failure, NYHA class 2-EF 45%   Diabetes mellitus   ESRD on hemodialysis   HTN (hypertension)     Oscar Castillo  [facility] Pager [phone]. If 8PM-8AM, please contact night-coverage at [email or web address], password [facility] 09/09/2012, 8:06 AM  LOS: 2 days

## 2012-09-10 DIAGNOSIS — N186 End stage renal disease: Secondary | ICD-10-CM | POA: Diagnosis not present

## 2012-09-10 DIAGNOSIS — S82209A Unspecified fracture of shaft of unspecified tibia, initial encounter for closed fracture: Secondary | ICD-10-CM | POA: Diagnosis not present

## 2012-09-10 DIAGNOSIS — N2581 Secondary hyperparathyroidism of renal origin: Secondary | ICD-10-CM | POA: Diagnosis not present

## 2012-09-10 DIAGNOSIS — N039 Chronic nephritic syndrome with unspecified morphologic changes: Secondary | ICD-10-CM | POA: Diagnosis not present

## 2012-09-10 DIAGNOSIS — I120 Hypertensive chronic kidney disease with stage 5 chronic kidney disease or end stage renal disease: Secondary | ICD-10-CM | POA: Diagnosis not present

## 2012-09-10 LAB — GLUCOSE, CAPILLARY

## 2012-09-10 LAB — BASIC METABOLIC PANEL
CO2: 21 mEq/L (ref 19–32)
Calcium: 9.2 mg/dL (ref 8.4–10.5)
Potassium: 6 mEq/L — ABNORMAL HIGH (ref 3.5–5.1)
Sodium: 134 mEq/L — ABNORMAL LOW (ref 135–145)

## 2012-09-10 LAB — CBC
MCH: 27.8 pg (ref 26.0–34.0)
Platelets: 236 10*3/uL (ref 150–400)
RBC: 4.1 MIL/uL — ABNORMAL LOW (ref 4.22–5.81)
WBC: 7.9 10*3/uL (ref 4.0–10.5)

## 2012-09-10 LAB — PROTIME-INR
INR: 1.09 (ref 0.00–1.49)
Prothrombin Time: 14 seconds (ref 11.6–15.2)

## 2012-09-10 MED ORDER — ALTEPLASE 2 MG IJ SOLR
2.0000 mg | Freq: Once | INTRAMUSCULAR | Status: DC | PRN
  Filled 2012-09-10: qty 2

## 2012-09-10 MED ORDER — LIDOCAINE-PRILOCAINE 2.5-2.5 % EX CREA: 1.0000 "application " | TOPICAL_CREAM | CUTANEOUS | Status: DC | PRN

## 2012-09-10 MED ORDER — LIDOCAINE HCL (PF) 1 % IJ SOLN: 5.0000 mL | INTRAMUSCULAR | Status: DC | PRN

## 2012-09-10 MED ORDER — PENTAFLUOROPROP-TETRAFLUOROETH EX AERO: 1.0000 "application " | INHALATION_SPRAY | CUTANEOUS | Status: DC | PRN

## 2012-09-10 MED ORDER — HEPARIN SODIUM (PORCINE) 1000 UNIT/ML DIALYSIS: 1000.0000 [IU] | INTRAMUSCULAR | Status: DC | PRN

## 2012-09-10 MED ORDER — DOXERCALCIFEROL 4 MCG/2ML IV SOLN
INTRAVENOUS | Status: AC
  Filled 2012-09-10: qty 4

## 2012-09-10 MED ORDER — PANTOPRAZOLE SODIUM 40 MG PO TBEC
40.0000 mg | DELAYED_RELEASE_TABLET | Freq: Every day | ORAL | Status: DC
  Administered 2012-09-10 – 2012-09-12 (×3): 40 mg via ORAL
  Filled 2012-09-10 (×2): qty 1

## 2012-09-10 MED ORDER — SODIUM CHLORIDE 0.9 % IV SOLN: 100.0000 mL | INTRAVENOUS | Status: DC | PRN

## 2012-09-10 MED ORDER — NEPRO/CARBSTEADY PO LIQD: 237.0000 mL | ORAL | Status: DC | PRN

## 2012-09-10 NOTE — Progress Notes (Signed)
OT Cancellation Note  Patient Details Name: Oscar Castillo MRN: [id number] DOB: 19-Sep-1950   Cancelled Treatment:    Reason Eval/Treat Not Completed: Patient at procedure or test/ unavailable (HD. Will assess tomorrow)  Boomer Winders,HILLARY 09/10/2012, 2:21 PM [facility], OTR/L  [phone] 09/10/2012

## 2012-09-10 NOTE — Progress Notes (Signed)
Physical Therapy Evaluation Patient Details Name: Oscar Castillo MRN: [id number] DOB: 12-31-1950 Today's Date: 09/10/2012 Time: 4098-1191 PT Time Calculation (min): 23 min  PT Assessment / Plan / Recommendation Clinical Impression  62 yo M s/p Tib/fib fracture who is now NWB on LLE.  Pt will benefit from skilled PT to maximize mobiity. Depending on progress may be able to return home or may need SNF placement as wife works and can not be with pt during the day.  The pt was also driving himself to dialysis.    PT Assessment  Patient needs continued PT services    Follow Up Recommendations  Home health PT;Supervision for mobility/OOB (may need SNF depending on progress)    Does the patient have the potential to tolerate intense rehabilitation      Barriers to Discharge Decreased caregiver support      Equipment Recommendations  Wheelchair (measurements PT)    Recommendations for Other Services OT consult   Frequency Min 5X/week    Precautions / Restrictions Precautions Precautions: Fall Restrictions Weight Bearing Restrictions: Yes LLE Weight Bearing: Non weight bearing   Pertinent Vitals/Pain 5/10 in LLE      Mobility  Bed Mobility Bed Mobility: Supine to Sit Supine to Sit: 4: Min assist (assist with LLE) Transfers Transfers: Sit to Stand;Stand to Sit Sit to Stand: 4: Min assist;From bed;From chair/3-in-1;With upper extremity assist (performed twice for practice) Stand to Sit: 4: Min assist;With upper extremity assist;With armrests;To chair/3-in-1 Details for Transfer Assistance: verbal and tactile cues for technique Ambulation/Gait Ambulation/Gait Assistance: 4: Min assist Ambulation Distance (Feet): 4 Feet Assistive device: Rolling walker Ambulation/Gait Assistance Details: able to maintain NWB on LLE Gait Pattern:  (pt able to hop very short distance each attempt)    Exercises     PT Diagnosis: Difficulty walking  PT Problem List: Decreased strength;Decreased  activity tolerance;Decreased mobility;Decreased knowledge of use of DME;Decreased knowledge of precautions PT Treatment Interventions: DME instruction;Gait training;Stair training;Wheelchair mobility training;Patient/family education;Therapeutic exercise   PT Goals Acute Rehab PT Goals PT Goal Formulation: With patient/family Time For Goal Achievement: 09/17/12 Potential to Achieve Goals: Good Pt will go Supine/Side to Sit: with supervision PT Goal: Supine/Side to Sit - Progress: Goal set today Pt will go Sit to Stand: with supervision PT Goal: Sit to Stand - Progress: Goal set today Pt will Ambulate: 16 - 50 feet;with min assist;with rolling walker PT Goal: Ambulate - Progress: Goal set today Pt will Go Up / Down Stairs: 1-2 stairs;with min assist;with rolling walker PT Goal: Up/Down Stairs - Progress: Goal set today Pt will Propel Wheelchair: 51 - 150 feet;with supervision PT Goal: Propel Wheelchair - Progress: Goal set today  Visit Information  Last PT Received On: 09/10/12 Assistance Needed: +1    Subjective Data  Patient Stated Goal: pt agreed to improve mobility   Prior Functioning  Home Living Lives With: Spouse Available Help at Discharge: Family;Other (Comment) (wife works during the day) Type of Home: House Home Access: Stairs to enter Secretary/administrator of Steps: 1 Entrance Stairs-Rails: None Home Layout: One level Home Adaptive Equipment: Walker - rolling Prior Function Level of Independence: Independent Able to Take Stairs?: Yes Driving: Yes (drove to dialysis) Communication Communication: No difficulties    Cognition  Cognition Overall Cognitive Status: Appears within functional limits for tasks assessed/performed Arousal/Alertness: Awake/alert Orientation Level: Appears intact for tasks assessed Behavior During Session: Flat affect    Extremity/Trunk Assessment Right Lower Extremity Assessment RLE ROM/Strength/Tone: Within functional levels Left  Lower Extremity Assessment  LLE ROM/Strength/Tone: Deficits;Unable to fully assess (pt in ace bandage from foot to avove the knee)   Balance    End of Session PT - End of Session Equipment Utilized During Treatment: Gait belt Activity Tolerance: Patient tolerated treatment well Patient left: in chair;with family/visitor present;with call bell/phone within reach (wife present) Nurse Communication: Mobility status (discussed with NT)  GP     Donnella Sham 09/10/2012, 10:47 AM [place]ona [place], PT  [phone] 09/10/2012

## 2012-09-10 NOTE — Progress Notes (Signed)
Patient ID: Oscar Castillo, male   DOB: 23-Jan-1951, 62 y.o.   MRN: [id number]     Subjective:  Patient reports pain as mild.  He states that he feels better in the last few hours.  Objective:   VITALS:   Filed Vitals:   09/10/12 0129 09/10/12 0400 09/10/12 0500 09/10/12 0532  BP: 170/78   182/88  Pulse: 105   91  Temp: 99.7 F (37.6 C)   98.5 F (36.9 C)  TempSrc: Oral   Oral  Resp: 18 18  18   Height:      Weight:   103.42 kg (228 lb)   SpO2: 97% 100%  100%    ABD soft Sensation intact distally Dorsiflexion/Plantar flexion intact Incision: dressing C/D/I and no drainage   Lab Results  Component Value Date   WBC 7.9 09/10/2012   HGB 11.4* 09/10/2012   HCT 34.6* 09/10/2012   MCV 84.4 09/10/2012   PLT 236 09/10/2012     Assessment/Plan: 1 Day Post-Op   Principal Problem:   Left tibial fracture Active Problems:   Acute combined systolic and diastolic heart failure, NYHA class 2-EF 45%   Diabetes mellitus   ESRD on hemodialysis   HTN (hypertension)   Advance diet Up with therapy Plan for discharge tomorrow or Monday. Continue posterior splint. NWB left lower ext. dispo planning per medicine.   Haskel Khan 09/10/2012, 10:58 AM   Teryl Lucy, MD Cell [phone] Pager [phone]

## 2012-09-10 NOTE — Progress Notes (Signed)
Potassium of 6.0 called in from lab. Dr Lavera Guise notified. Informed hemodialysis per MD request.

## 2012-09-10 NOTE — Progress Notes (Signed)
Subjective: No complaints, for surgery today  Objective Vital signs in last 24 hours: Filed Vitals:   09/10/12 0129 09/10/12 0400 09/10/12 0500 09/10/12 0532  BP: 170/78   182/88  Pulse: 105   91  Temp: 99.7 F (37.6 C)   98.5 F (36.9 C)  TempSrc: Oral   Oral  Resp: 18 18  18   Height:      Weight:   103.42 kg (228 lb)   SpO2: 97% 100%  100%   Weight change:   Intake/Output Summary (Last 24 hours) at 09/10/12 0836 Last data filed at 09/09/12 1323  Gross per 24 hour  Intake    400 ml  Output     50 ml  Net    350 ml   Labs: Basic Metabolic Panel:  Recent Labs Lab 09/07/12 2244 09/08/12 0547 09/09/12 0710 09/09/12 1605 09/10/12 0630  NA 137 135 134*  --  134*  K 5.3* 4.5 4.7  --  6.0*  CL 94* 95* 96  --  94*  CO2 26 22 25   --  21  GLUCOSE 173* 158* 174*  --  224*  BUN 62* 64* 41*  --  57*  CREATININE 9.59* 9.97* 8.05* 8.92* 10.18*  CALCIUM 10.0 9.4 9.2  --  9.2  PHOS  --  6.6*  --   --   --    Liver Function Tests:  Recent Labs Lab 09/08/12 0547  AST 26  ALT 17  ALKPHOS 86  BILITOT 0.2*  PROT 7.6  ALBUMIN 3.3*   No results found for this basename: LIPASE, AMYLASE,  in the last 168 hours No results found for this basename: AMMONIA,  in the last 168 hours CBC:  Recent Labs Lab 09/07/12 2244 09/08/12 0547 09/09/12 0710 09/09/12 1605 09/10/12 0630  WBC 10.7* 9.7 6.0 6.7 7.9  NEUTROABS 8.3*  --  3.4  --   --   HGB 13.2 11.4* 11.6* 11.7* 11.4*  HCT 39.2 35.0* 35.8* 35.6* 34.6*  MCV 83.9 83.7 84.4 85.4 84.4  PLT 283 249 222 226 236   PT/INR: @LABRCNTIP (inr:5) Cardiac Enzymes: )No results found for this basename: CKTOTAL, CKMB, CKMBINDEX, TROPONINI,  in the last 168 hours CBG:  Recent Labs Lab 09/09/12 1606 09/09/12 2004 09/09/12 2348 09/10/12 0358 09/10/12 0733  GLUCAP 152* 118* 137* 179* 214*    Iron Studies: No results found for this basename: IRON, TIBC, TRANSFERRIN, FERRITIN,  in the last 168 hours  Scheduled Meds . amLODipine   10 mg Oral Daily  . brimonidine  1 drop Both Eyes BID  . brinzolamide  1 drop Both Eyes BID  . docusate sodium  100 mg Oral BID  . doxercalciferol  6 mcg Intravenous Q T,Th,Sa-HD  . ferric gluconate (FERRLECIT/NULECIT) IV  62.5 mg Intravenous Weekly  . heparin subcutaneous  5,000 Units Subcutaneous Q8H  . insulin aspart  0-5 Units Subcutaneous QHS  . insulin aspart  0-9 Units Subcutaneous TID WC  . labetalol  300 mg Oral BID  . pantoprazole (PROTONIX) IV  40 mg Intravenous QHS  . patient's guide to using coumadin book   Does not apply Once  . senna  1 tablet Oral BID  . sodium chloride  3 mL Intravenous Q12H  . Travoprost (BAK Free)  1 drop Both Eyes QHS  . warfarin  7.5 mg Oral ONCE-1800  . warfarin   Does not apply Once  . Warfarin - Pharmacist Dosing Inpatient   Does not apply [phone]  Physical Exam:  Blood pressure 182/88, pulse 91, temperature 98.5 F (36.9 C), temperature source Oral, resp. rate 18, height 5\' 10"  (1.778 m), weight 103.42 kg (228 lb), SpO2 100.00%.  Gen: Alert, no distress   Neck: no JVD Resp: clear  Cardio: regular rate and rhythm, S1, S2 normal GI: soft, non-tender; bowel sounds normal Extremities: left lower leg stabilized, no edema  Neurologic: Grossly normal  Dialysis Access: Right IJ catheter, left BVT (placed 06/14/12 by Dr. Edilia Bo) with + bruit   Dialysis Orders: Center: [place] on TTS.  EDW 99 kg HD Bath 2K/2.25Ca Time 4 hrs Heparin 5000 U. Access Right IJ catheter, also L BVT (placed 06/14/12) BFR 400 DFR 800 Hectorol 6 mcg IV/HD Epogen 0 Units IV/HD Venofer 50 mg on Thurs.   Assessment/Plan:  1. Displaced left tibial fx - s/p ORIF 2/14 2. ESRD, cont hd TTS. High K+ 6.0 today, treat with HD today 3. Hypertension/volume - BP 163/87 most recently, on Amlodipine 10 mg qd and Labetalol 300 mg bid, 4kg up UF same as tol 4. Anemia - Hgb 11.4, no outpatient Epogen, Venofer 50 mg on Thurs. 5. Metabolic bone disease - Ca 9.4 (10 corrected), P 6.6; Hectorol  6 mcg, Phoslo 2 with meals. 6. Nutrition - Alb 3.3, renal diet. 7. DM - on insulin per primary. 8. Hx CVA - in 07/2009. 9. Hx nephrolithiasis    Oscar Moselle  MD [phone] pgr    [phone] cell 09/10/2012, 9:00 AM

## 2012-09-10 NOTE — Progress Notes (Signed)
ANTICOAGULATION CONSULT NOTE - Follow Up Consult  Pharmacy Consult for coumadin Indication: VTE prophylaxis  No Known Allergies  Patient Measurements: Height: 5\' 10"  (177.8 cm) Weight: 228 lb (103.42 kg) IBW/kg (Calculated) : 73 Heparin Dosing Weight:   Vital Signs: Temp: 98.5 F (36.9 C) (02/15 0532) Temp src: Oral (02/15 0532) BP: 144/69 mmHg (02/15 1051) Pulse Rate: 88 (02/15 1051)  Labs:  Recent Labs  09/09/12 0710 09/09/12 1605 09/09/12 1726 09/10/12 0630  HGB 11.6* 11.7*  --  11.4*  HCT 35.8* 35.6*  --  34.6*  PLT 222 226  --  236  LABPROT  --   --  13.2 14.0  INR  --   --  1.01 1.09  CREATININE 8.05* 8.92*  --  10.18*    Estimated Creatinine Clearance: 9.2 ml/min (by C-G formula based on Cr of 10.18).   Medications:  Scheduled:  . amLODipine  10 mg Oral Daily  . brimonidine  1 drop Both Eyes BID  . brinzolamide  1 drop Both Eyes BID  . docusate sodium  100 mg Oral BID  . doxercalciferol  6 mcg Intravenous Q T,Th,Sa-HD  . ferric gluconate (FERRLECIT/NULECIT) IV  62.5 mg Intravenous Weekly  . heparin subcutaneous  5,000 Units Subcutaneous Q8H  . insulin aspart  0-5 Units Subcutaneous QHS  . insulin aspart  0-9 Units Subcutaneous TID WC  . labetalol  300 mg Oral BID  . pantoprazole (PROTONIX) IV  40 mg Intravenous QHS  . patient's guide to using coumadin book   Does not apply Once  . senna  1 tablet Oral BID  . sodium chloride  3 mL Intravenous Q12H  . Travoprost (BAK Free)  1 drop Both Eyes QHS  . warfarin  7.5 mg Oral ONCE-1800  . warfarin   Does not apply Once  . Warfarin - Pharmacist Dosing Inpatient   Does not apply q1800   Infusions:  . 0.45 % NaCl with KCl 20 mEq / L      Assessment: 62 yo male s/p ORIF will be continued on coumadin for VTE px.  INR today is 1.09.  No dose was give to patient yesterday because last night dose was mistakenly timed to be given tonight.  Goal of Therapy:  INR 2-3    Plan:  1) Comadin 7.5mg  po x1 2) INR  in am 3) d/c heparin when INR >/= 2   Lendy Dittrich, Tsz-Yin 09/10/2012,11:25 AM

## 2012-09-10 NOTE — Progress Notes (Signed)
NCM spoke to pt and states his wife is there with him in the afternoon to assist with his care. States he had [facility] in the past but currently is refusing HH. States no DME is needed. Isidoro Donning RN CCM Case Mgmt phone [phone]

## 2012-09-10 NOTE — Progress Notes (Signed)
[facility] PROGRESS NOTE  Oscar Castillo ZOX:[id number] DOB: [birth year] DOA: 09/07/2012 PCP: Oliver Barre, MD  Assessment/Plan: 1. Tibia fracture - sp ORIF 09/09/12 by Dr. Dion Saucier - to start PT/OT . Suspect he may be able to Dc home with [facility]  2. ESRD - HD per [facility]  3. HTN - controlled with HD , amlodipine and labetalol  4. Hyperkalemia -should improve with HD   Code Status: full Family Communication: wife  Disposition Plan: home    Consultants:  cka  Ortho - [place]   Procedures:  orif    HPI/Subjective: Awaiting HD  Objective: Filed Vitals:   09/10/12 1051 09/10/12 1245 09/10/12 1302 09/10/12 1330  BP: 144/69 139/80 147/82 110/69  Pulse: 88 86 85 82  Temp:  97.1 F (36.2 C)    TempSrc:  Oral    Resp:  18    Height:      Weight:  102.5 kg (225 lb 15.5 oz)    SpO2:  98%      Intake/Output Summary (Last 24 hours) at 09/10/12 1407 Last data filed at 09/10/12 1100  Gross per 24 hour  Intake    720 ml  Output      0 ml  Net    720 ml   Filed Weights   09/08/12 0205 09/10/12 0500 09/10/12 1245  Weight: 104 kg (229 lb 4.5 oz) 103.42 kg (228 lb) 102.5 kg (225 lb 15.5 oz)    Exam:   General:  axox3  Cardiovascular: rrr  Respiratory: ctab   Abdomen: soft, nt   Data Reviewed: Basic Metabolic Panel:  Recent Labs Lab 09/07/12 2244 09/08/12 0547 09/09/12 0710 09/09/12 1605 09/10/12 0630  NA 137 135 134*  --  134*  K 5.3* 4.5 4.7  --  6.0*  CL 94* 95* 96  --  94*  CO2 26 22 25   --  21  GLUCOSE 173* 158* 174*  --  224*  BUN 62* 64* 41*  --  57*  CREATININE 9.59* 9.97* 8.05* 8.92* 10.18*  CALCIUM 10.0 9.4 9.2  --  9.2  MG  --  2.0  --   --   --   PHOS  --  6.6*  --   --   --    Liver Function Tests:  Recent Labs Lab 09/08/12 0547  AST 26  ALT 17  ALKPHOS 86  BILITOT 0.2*  PROT 7.6  ALBUMIN 3.3*   No results found for this basename: LIPASE, AMYLASE,  in the last 168 hours No results found for this basename: AMMONIA,  in the last 168  hours CBC:  Recent Labs Lab 09/07/12 2244 09/08/12 0547 09/09/12 0710 09/09/12 1605 09/10/12 0630  WBC 10.7* 9.7 6.0 6.7 7.9  NEUTROABS 8.3*  --  3.4  --   --   HGB 13.2 11.4* 11.6* 11.7* 11.4*  HCT 39.2 35.0* 35.8* 35.6* 34.6*  MCV 83.9 83.7 84.4 85.4 84.4  PLT 283 249 222 226 236   Cardiac Enzymes: No results found for this basename: CKTOTAL, CKMB, CKMBINDEX, TROPONINI,  in the last 168 hours BNP (last 3 results)  Recent Labs  06/11/12 0916  PROBNP 6937.0*   CBG:  Recent Labs Lab 09/09/12 2004 09/09/12 2348 09/10/12 0358 09/10/12 0733 09/10/12 1108  GLUCAP 118* 137* 179* 214* 223*    No results found for this or any previous visit (from the past 240 hour(s)).   Studies: Dg Tibia/fibula Left  09/09/2012  *RADIOLOGY REPORT*  Clinical Data: Intraoperative fixation  of left tibia and fibular fractures  DG C-ARM 61-120 MIN, LEFT TIBIA AND FIBULA - 2 VIEW  Comparison:  Preoperative imaging 09/07/2012  Findings:  Intraoperative fluoroscopic images demonstrate intramedullary rod fixation of the previously seen tibial fracture with fracture fragments in near anatomic alignment.  There has been side plate and screw fixation of the previously seen distal fibular fracture. Fibular fracture fragments are also in near anatomic alignment.  No evidence for hardware failure.  IMPRESSION:  Expected intraoperative appearance after ORIF of left tibial and fibular fractures as above.   Original Report Authenticated By: Christiana Pellant, M.D.    Dg Tibia/fibula Left Port  09/09/2012  *RADIOLOGY REPORT*  Clinical Data: History of ORIF.  History of tibial and fibular fractures.  PORTABLE LEFT TIBIA AND FIBULA - 2 VIEW  Comparison: 09/07/2012 study.  Findings: Previously fractures of the diaphyses of tibia and fibula have been demonstrated.  ORIF has been performed.  There is an intramedullary rod within the tibia with two proximal transverse screws and two distal transverse screws.  There is near  apposition at the fracture sites with near anatomic alignment.  The fibular fracture is stabilized in apposition with near anatomic alignment by metallic side plate with multiple transverse screws.  IMPRESSION: ORIF has been performed stabilizing fractures of tibia and fibula.   Original Report Authenticated By: Onalee Hua Call    Dg C-arm 61-120 Min  09/09/2012  *RADIOLOGY REPORT*  Clinical Data: Intraoperative fixation of left tibia and fibular fractures  DG C-ARM 61-120 MIN, LEFT TIBIA AND FIBULA - 2 VIEW  Comparison:  Preoperative imaging 09/07/2012  Findings:  Intraoperative fluoroscopic images demonstrate intramedullary rod fixation of the previously seen tibial fracture with fracture fragments in near anatomic alignment.  There has been side plate and screw fixation of the previously seen distal fibular fracture. Fibular fracture fragments are also in near anatomic alignment.  No evidence for hardware failure.  IMPRESSION:  Expected intraoperative appearance after ORIF of left tibial and fibular fractures as above.   Original Report Authenticated By: Christiana Pellant, M.D.     Scheduled Meds: . amLODipine  10 mg Oral Daily  . brimonidine  1 drop Both Eyes BID  . brinzolamide  1 drop Both Eyes BID  . docusate sodium  100 mg Oral BID  . doxercalciferol  6 mcg Intravenous Q T,Th,Sa-HD  . ferric gluconate (FERRLECIT/NULECIT) IV  62.5 mg Intravenous Weekly  . heparin subcutaneous  5,000 Units Subcutaneous Q8H  . insulin aspart  0-5 Units Subcutaneous QHS  . insulin aspart  0-9 Units Subcutaneous TID WC  . labetalol  300 mg Oral BID  . pantoprazole  40 mg Oral Daily  . patient's guide to using coumadin book   Does not apply Once  . senna  1 tablet Oral BID  . sodium chloride  3 mL Intravenous Q12H  . Travoprost (BAK Free)  1 drop Both Eyes QHS  . warfarin  7.5 mg Oral ONCE-1800  . warfarin   Does not apply Once  . Warfarin - Pharmacist Dosing Inpatient   Does not apply q1800   Continuous  Infusions: . 0.45 % NaCl with KCl 20 mEq / L      Principal Problem:   Left tibial fracture Active Problems:   Acute combined systolic and diastolic heart failure, NYHA class 2-EF 45%   Diabetes mellitus   ESRD on hemodialysis   HTN (hypertension)     Josefa Syracuse  [facility] Pager [phone]. If 8PM-8AM, please contact night-coverage at  [email or web address], password [email or web address] 09/10/2012, 2:07 PM  LOS: 3 days

## 2012-09-10 NOTE — Procedures (Signed)
I was present at this dialysis session. I have reviewed the session itself and made appropriate changes.   Rob Turhan Chill, MD [facility] 09/10/2012, 4:21 PM   

## 2012-09-11 DIAGNOSIS — S82209A Unspecified fracture of shaft of unspecified tibia, initial encounter for closed fracture: Secondary | ICD-10-CM | POA: Diagnosis not present

## 2012-09-11 DIAGNOSIS — N039 Chronic nephritic syndrome with unspecified morphologic changes: Secondary | ICD-10-CM | POA: Diagnosis not present

## 2012-09-11 DIAGNOSIS — N186 End stage renal disease: Secondary | ICD-10-CM | POA: Diagnosis not present

## 2012-09-11 DIAGNOSIS — E119 Type 2 diabetes mellitus without complications: Secondary | ICD-10-CM | POA: Diagnosis not present

## 2012-09-11 DIAGNOSIS — I120 Hypertensive chronic kidney disease with stage 5 chronic kidney disease or end stage renal disease: Secondary | ICD-10-CM | POA: Diagnosis not present

## 2012-09-11 DIAGNOSIS — D631 Anemia in chronic kidney disease: Secondary | ICD-10-CM | POA: Diagnosis not present

## 2012-09-11 DIAGNOSIS — N2581 Secondary hyperparathyroidism of renal origin: Secondary | ICD-10-CM | POA: Diagnosis not present

## 2012-09-11 LAB — PROTIME-INR
INR: 1.08 (ref 0.00–1.49)
Prothrombin Time: 13.9 seconds (ref 11.6–15.2)

## 2012-09-11 LAB — CBC
MCH: 28.1 pg (ref 26.0–34.0)
MCHC: 33.8 g/dL (ref 30.0–36.0)
MCV: 83.2 fL (ref 78.0–100.0)
Platelets: 223 10*3/uL (ref 150–400)
RDW: 14.4 % (ref 11.5–15.5)

## 2012-09-11 LAB — BASIC METABOLIC PANEL
Calcium: 9.3 mg/dL (ref 8.4–10.5)
Creatinine, Ser: 8.03 mg/dL — ABNORMAL HIGH (ref 0.50–1.35)
GFR calc Af Amer: 7 mL/min — ABNORMAL LOW (ref >90)
GFR calc non Af Amer: 6 mL/min — ABNORMAL LOW (ref >90)
Sodium: 132 mEq/L — ABNORMAL LOW (ref 135–145)

## 2012-09-11 LAB — HEMOGLOBIN A1C
Hgb A1c MFr Bld: 7.8 % — ABNORMAL HIGH (ref <5.7)
Mean Plasma Glucose: 177 mg/dL — ABNORMAL HIGH (ref <117)

## 2012-09-11 LAB — GLUCOSE, CAPILLARY
Glucose-Capillary: 179 mg/dL — ABNORMAL HIGH (ref 70–99)
Glucose-Capillary: 200 mg/dL — ABNORMAL HIGH (ref 70–99)
Glucose-Capillary: 219 mg/dL — ABNORMAL HIGH (ref 70–99)

## 2012-09-11 MED ORDER — POLYETHYLENE GLYCOL 3350 17 G PO PACK
68.0000 g | PACK | Freq: Every day | ORAL | Status: DC
  Administered 2012-09-11 – 2012-09-12 (×2): 68 g via ORAL
  Filled 2012-09-11 (×2): qty 4

## 2012-09-11 MED ORDER — WARFARIN SODIUM 10 MG PO TABS
10.0000 mg | ORAL_TABLET | Freq: Once | ORAL | Status: DC
  Administered 2012-09-11: 10 mg via ORAL
  Filled 2012-09-11: qty 1

## 2012-09-11 NOTE — Progress Notes (Signed)
[facility] PROGRESS NOTE  Oscar Castillo:[id number] DOB: [birth year] DOA: 09/07/2012 PCP: Oliver Barre, MD  Assessment/Plan: 1. Tibia fracture - sp ORIF 09/09/12 by Dr. Dion Saucier - to start PT/OT . Suspect he may be able to Dc home with [facility]  2. ESRD - HD per [facility]  3. HTN - controlled with HD , amlodipine and labetalol  4. Hyperkalemia -should improve with HD  5. Constipation - Miralax 68 g prescribed 09/11/12  6. DM type 2 - patient used to have severe diabetes, by 05/2012 A1C was less than 6 and patient was without diabetic treatment prior to this admission. CBGs fairly elevated in hospital - plan to check A1C to better plan outpatient treatment   Code Status: full Family Communication: wife  Disposition Plan: home vs CIR    Consultants:  cka  Ortho - [place]   Procedures:  orif    HPI/Subjective: Sleepy   Objective: Filed Vitals:   09/11/12 0000 09/11/12 0400 09/11/12 0607 09/11/12 0753  BP:   124/67   Pulse:   84   Temp:   98.4 F (36.9 C)   TempSrc:   Oral   Resp: 18 18 18 17   Height:      Weight:   98.8 kg (217 lb 13 oz)   SpO2: 100% 99% 99% 99%    Intake/Output Summary (Last 24 hours) at 09/11/12 0829 Last data filed at 09/10/12 2100  Gross per 24 hour  Intake    820 ml  Output   2898 ml  Net  -2078 ml   Filed Weights   09/10/12 1245 09/10/12 1637 09/11/12 0607  Weight: 102.5 kg (225 lb 15.5 oz) 98.4 kg (216 lb 14.9 oz) 98.8 kg (217 lb 13 oz)    Exam:   General:  Arousable, oriented   Cardiovascular: rrr  Respiratory: ctab   Abdomen: soft, nt   Data Reviewed: Basic Metabolic Panel:  Recent Labs Lab 09/07/12 2244 09/08/12 0547 09/09/12 0710 09/09/12 1605 09/10/12 0630 09/11/12 0526  NA 137 135 134*  --  134* 132*  K 5.3* 4.5 4.7  --  6.0* 4.7  CL 94* 95* 96  --  94* 91*  CO2 26 22 25   --  21 22  GLUCOSE 173* 158* 174*  --  224* 191*  BUN 62* 64* 41*  --  57* 46*  CREATININE 9.59* 9.97* 8.05* 8.92* 10.18* 8.03*  CALCIUM 10.0 9.4  9.2  --  9.2 9.3  MG  --  2.0  --   --   --   --   PHOS  --  6.6*  --   --   --   --    Liver Function Tests:  Recent Labs Lab 09/08/12 0547  AST 26  ALT 17  ALKPHOS 86  BILITOT 0.2*  PROT 7.6  ALBUMIN 3.3*   No results found for this basename: LIPASE, AMYLASE,  in the last 168 hours No results found for this basename: AMMONIA,  in the last 168 hours CBC:  Recent Labs Lab 09/07/12 2244 09/08/12 0547 09/09/12 0710 09/09/12 1605 09/10/12 0630 09/11/12 0526  WBC 10.7* 9.7 6.0 6.7 7.9 11.6*  NEUTROABS 8.3*  --  3.4  --   --   --   HGB 13.2 11.4* 11.6* 11.7* 11.4* 10.5*  HCT 39.2 35.0* 35.8* 35.6* 34.6* 31.1*  MCV 83.9 83.7 84.4 85.4 84.4 83.2  PLT 283 249 222 226 236 223   Cardiac Enzymes: No results found for this basename:  CKTOTAL, CKMB, CKMBINDEX, TROPONINI,  in the last 168 hours BNP (last 3 results)  Recent Labs  06/11/12 0916  PROBNP 6937.0*   CBG:  Recent Labs Lab 09/10/12 1108 09/10/12 1719 09/10/12 2008 09/10/12 2139 09/11/12 0610  GLUCAP 223* 190* 208* 179* 179*    No results found for this or any previous visit (from the past 240 hour(s)).   Studies: Dg Tibia/fibula Left  09/09/2012  *RADIOLOGY REPORT*  Clinical Data: Intraoperative fixation of left tibia and fibular fractures  DG C-ARM 61-120 MIN, LEFT TIBIA AND FIBULA - 2 VIEW  Comparison:  Preoperative imaging 09/07/2012  Findings:  Intraoperative fluoroscopic images demonstrate intramedullary rod fixation of the previously seen tibial fracture with fracture fragments in near anatomic alignment.  There has been side plate and screw fixation of the previously seen distal fibular fracture. Fibular fracture fragments are also in near anatomic alignment.  No evidence for hardware failure.  IMPRESSION:  Expected intraoperative appearance after ORIF of left tibial and fibular fractures as above.   Original Report Authenticated By: Christiana Pellant, M.D.    Dg Tibia/fibula Left Port  09/09/2012   *RADIOLOGY REPORT*  Clinical Data: History of ORIF.  History of tibial and fibular fractures.  PORTABLE LEFT TIBIA AND FIBULA - 2 VIEW  Comparison: 09/07/2012 study.  Findings: Previously fractures of the diaphyses of tibia and fibula have been demonstrated.  ORIF has been performed.  There is an intramedullary rod within the tibia with two proximal transverse screws and two distal transverse screws.  There is near apposition at the fracture sites with near anatomic alignment.  The fibular fracture is stabilized in apposition with near anatomic alignment by metallic side plate with multiple transverse screws.  IMPRESSION: ORIF has been performed stabilizing fractures of tibia and fibula.   Original Report Authenticated By: Onalee Hua Call    Dg C-arm 61-120 Min  09/09/2012  *RADIOLOGY REPORT*  Clinical Data: Intraoperative fixation of left tibia and fibular fractures  DG C-ARM 61-120 MIN, LEFT TIBIA AND FIBULA - 2 VIEW  Comparison:  Preoperative imaging 09/07/2012  Findings:  Intraoperative fluoroscopic images demonstrate intramedullary rod fixation of the previously seen tibial fracture with fracture fragments in near anatomic alignment.  There has been side plate and screw fixation of the previously seen distal fibular fracture. Fibular fracture fragments are also in near anatomic alignment.  No evidence for hardware failure.  IMPRESSION:  Expected intraoperative appearance after ORIF of left tibial and fibular fractures as above.   Original Report Authenticated By: Christiana Pellant, M.D.     Scheduled Meds: . amLODipine  10 mg Oral Daily  . brimonidine  1 drop Both Eyes BID  . brinzolamide  1 drop Both Eyes BID  . docusate sodium  100 mg Oral BID  . doxercalciferol  6 mcg Intravenous Q T,Th,Sa-HD  . ferric gluconate (FERRLECIT/NULECIT) IV  62.5 mg Intravenous Weekly  . heparin subcutaneous  5,000 Units Subcutaneous Q8H  . insulin aspart  0-5 Units Subcutaneous QHS  . insulin aspart  0-9 Units Subcutaneous  TID WC  . labetalol  300 mg Oral BID  . pantoprazole  40 mg Oral Daily  . patient's guide to using coumadin book   Does not apply Once  . polyethylene glycol  68 g Oral Daily  . senna  1 tablet Oral BID  . sodium chloride  3 mL Intravenous Q12H  . Travoprost (BAK Free)  1 drop Both Eyes QHS  . warfarin   Does not apply Once  .  Warfarin - Pharmacist Dosing Inpatient   Does not apply q1800   Continuous Infusions:    Principal Problem:   Left tibial fracture Active Problems:   Acute combined systolic and diastolic heart failure, NYHA class 2-EF 45%   Diabetes mellitus   ESRD on hemodialysis   HTN (hypertension)     Oscar Castillo  [facility] Pager [phone]. If 8PM-8AM, please contact night-coverage at [email or web address], password [facility] 09/11/2012, 8:29 AM  LOS: 4 days

## 2012-09-11 NOTE — Progress Notes (Signed)
Patient ID: Oscar Castillo, male   DOB: 16-May-1951, 62 y.o.   MRN: [id number]     Subjective:  Patient reports pain as mild to moderate.  Patient is in and out of sleep due to pain medicine wife is at the bedside and reports that he is doing some better but having mild to moderate pain.  Objective:   VITALS:   Filed Vitals:   09/11/12 0400 09/11/12 0607 09/11/12 0753 09/11/12 1121  BP:  124/67  126/70  Pulse:  84  82  Temp:  98.4 F (36.9 C)    TempSrc:  Oral    Resp: 18 18 17    Height:      Weight:  98.8 kg (217 lb 13 oz)    SpO2: 99% 99% 99%     ABD soft Sensation intact distally Dorsiflexion/Plantar flexion intact Incision: dressing C/D/I and no drainage   Lab Results  Component Value Date   WBC 11.6* 09/11/2012   HGB 10.5* 09/11/2012   HCT 31.1* 09/11/2012   MCV 83.2 09/11/2012   PLT 223 09/11/2012     Assessment/Plan: 2 Days Post-Op   Principal Problem:   Left tibial fracture Active Problems:   Morbid obesity   Acute combined systolic and diastolic heart failure, NYHA class 2-EF 45%   Diabetes mellitus   ESRD on hemodialysis   HTN (hypertension)   Advance diet Up with therapy Continue plan per medicine NWB left lower ext.    Haskel Khan 09/11/2012, 11:42 AM   Teryl Lucy, MD Cell [phone] Pager [phone]

## 2012-09-11 NOTE — Progress Notes (Signed)
Occupational Therapy Evaluation Patient Details Name: Oscar Castillo MRN: [id number] DOB: [birth year] Today's Date: 09/11/2012 Time: 4098-1191 OT Time Calculation (min): 27 min  OT Assessment / Plan / Recommendation Clinical Impression  Pt s/p fall with R tibial fx. ORIF. NWB. PTA, pt indpendent and drove self to HD T TH SAT. Pt presents with decreased strength, mobilty and endurance, decreasing indpendence with ADL and functional mobility for ADL. Pt would benefit from CIR to return to PLOF. Pt will benefit from OT to faciliate D/C to CIR.    OT Assessment  Patient needs continued OT Services    Follow Up Recommendations  CIR    Barriers to Discharge None    Equipment Recommendations  3 in 1 bedside comode;Tub/shower bench    Recommendations for Other Services Rehab consult  Frequency  Min 3X/week    Precautions / Restrictions Precautions Precautions: Fall Restrictions LLE Weight Bearing: Non weight bearing   Pertinent Vitals/Pain No c/o pain    ADL  Eating/Feeding: Independent Where Assessed - Eating/Feeding: Chair Grooming: Set up;Supervision/safety Where Assessed - Grooming: Supported sitting Upper Body Bathing: Minimal assistance Where Assessed - Upper Body Bathing: Supported sitting Lower Body Bathing: Maximal assistance Where Assessed - Lower Body Bathing: Supported sit to stand Upper Body Dressing: Minimal assistance Where Assessed - Upper Body Dressing: Supported sitting Lower Body Dressing: Maximal assistance Where Assessed - Lower Body Dressing: Supported sit to Pharmacist, hospital: +2 Total assistance Toilet Transfer: Patient Percentage: 50% Statistician Method: Sit to stand;Stand pivot Acupuncturist: Other (comment) (chair - bed) Toileting - Clothing Manipulation and Hygiene: Moderate assistance Where Assessed - Toileting Clothing Manipulation and Hygiene: Standing Equipment Used: Gait belt;Rolling walker Transfers/Ambulation Related to  ADLs: +2 MAx A. Pt with posterior lean ADL Comments: lmited by balance deficit and apparent cognitive deficits    OT Diagnosis: Generalized weakness;Acute pain;Altered mental status (cognition? affected by pain meds?)  OT Problem List: Decreased strength;Decreased range of motion;Decreased activity tolerance;Impaired balance (sitting and/or standing);Decreased coordination;Decreased safety awareness;Decreased knowledge of use of DME or AE;Decreased knowledge of precautions;Pain;Decreased cognition OT Treatment Interventions: Self-care/ADL training;Therapeutic exercise;Energy conservation;DME and/or AE instruction;Therapeutic activities;Cognitive remediation/compensation;Patient/family education;Balance training   OT Goals Acute Rehab OT Goals OT Goal Formulation: With patient Time For Goal Achievement: 09/25/12 Potential to Achieve Goals: Good ADL Goals Pt Will Perform Lower Body Bathing: with min assist;Sit to stand from bed;Unsupported;with adaptive equipment ADL Goal: Lower Body Bathing - Progress: Goal set today Pt Will Perform Lower Body Dressing: with min assist;with caregiver independent in assisting;Sit to stand from bed;Unsupported;with adaptive equipment ADL Goal: Lower Body Dressing - Progress: Goal set today Pt Will Transfer to Toilet: with min assist;Ambulation;with DME;3-in-1;Maintaining weight bearing status ADL Goal: Toilet Transfer - Progress: Goal set today Pt Will Perform Toileting - Clothing Manipulation: with supervision;Sitting on 3-in-1 or toilet;Standing ADL Goal: Toileting - Clothing Manipulation - Progress: Goal set today Pt Will Perform Toileting - Hygiene: with modified independence;Sit to stand from 3-in-1/toilet;Leaning right and/or left on 3-in-1/toilet ADL Goal: Toileting - Hygiene - Progress: Goal set today Additional ADL Goal #1: complete bed mobility for ADL with S ADL Goal: Additional Goal #1 - Progress: Goal set today  Visit Information  Last OT  Received On: 07/05/13 Assistance Needed: +2    Subjective Data      Prior Functioning     Home Living Lives With: Spouse Available Help at Discharge: Family;Other (Comment) (wife works during the day) Type of Home: House Home Access: Stairs to enter [employer] of Steps:  1 Entrance Stairs-Rails: None Home Layout: One level Bathroom Shower/Tub: Engineer, manufacturing systems: Standard Bathroom Accessibility: Yes How Accessible: Accessible via walker Home Adaptive Equipment: Walker - rolling Prior Function Level of Independence: Independent Able to Take Stairs?: Yes Driving: Yes (drove to dialysis) Vocation: Retired Musician: No difficulties Dominant Hand: Right         Vision/Perception     Copywriter, advertising Overall Cognitive Status: Impaired Area of Impairment: Attention;Following commands;Safety/judgement;Awareness of errors;Awareness of deficits;Problem solving Arousal/Alertness: Awake/alert Orientation Level: Appears intact for tasks assessed Behavior During Session: Flat affect Current Attention Level: Selective Following Commands: Follows one step commands consistently Safety/Judgement: Decreased awareness of safety precautions Safety/Judgement - Other Comments: difficulty maintaining WBS Awareness of Errors: Assistance required to identify errors made;Assistance required to correct errors made Awareness of Errors - Other Comments: unaware of falling posteriorly in sitting Awareness of Deficits: intellectual awareness Problem Solving: slow processing. Mod A functional basic Cognition - Other Comments: ? affected by pain meds and being HD pt    Extremity/Trunk Assessment Right Upper Extremity Assessment RUE ROM/Strength/Tone: Deficits RUE ROM/Strength/Tone Deficits: WFL with the exception of limited AROM of shoulder @45  RUE Sensation: WFL - Light Touch;WFL - Proprioception RUE Coordination: WFL - gross/fine motor Left  Upper Extremity Assessment LUE ROM/Strength/Tone: Deficits LUE ROM/Strength/Tone Deficits: same as right LUE Sensation: WFL - Light Touch;WFL - Proprioception LUE Coordination: WFL - gross/fine motor Right Lower Extremity Assessment RLE ROM/Strength/Tone: WFL for tasks assessed Left Lower Extremity Assessment LLE ROM/Strength/Tone: Deficits;Due to pain;Due to precautions Trunk Assessment Trunk Assessment: Other exceptions (unable to maintain upright posture sitting EOB)     Mobility Bed Mobility Bed Mobility: Supine to Sit;Sitting - Scoot to Edge of Bed Supine to Sit: 3: Mod assist;HOB flat Sitting - Scoot to Edge of Bed: 4: Min assist Sit to Supine: 3: Mod assist;HOB flat Details for Bed Mobility Assistance: Max directional cues for sequencing, increased use of UE's to (A) with transition, & overall technique.  (A) to elevate torso to sitting upright, support to prevent LOB while scooting to EOB, & (A) to manage L LE to EOB/OOB.   Transfers Transfers: Sit to Stand;Stand to Sit Sit to Stand: 2: Max assist;With upper extremity assist;From bed Stand to Sit: 3: Mod assist;With upper extremity assist;To bed Details for Transfer Assistance: Max a to maintain balance. posterior lean. Unable to maintain upright posture without support of bed     Exercise     Balance Balance Balance Assessed: Yes Static Sitting Balance Static Sitting - Balance Support: Bilateral upper extremity supported;Feet supported Static Sitting - Level of Assistance: 3: Mod assist Static Sitting - Comment/# of Minutes: 5 Static Standing Balance Static Standing - Balance Support: Bilateral upper extremity supported Static Standing - Level of Assistance: 2: Max assist   End of Session OT - End of Session Equipment Utilized During Treatment: Gait belt Activity Tolerance: Other (comment) (limited by ? impact of medication?) Patient left: in bed;with call bell/phone within reach;with family/visitor present Nurse  Communication: Mobility status;Other (comment);Weight bearing status;Precautions (cognition? pain meds?HD)  GO     Maty Zeisler,HILLARY 09/11/2012, 1:45 PM [facility], OTR/L  [phone] 09/11/2012

## 2012-09-11 NOTE — Progress Notes (Addendum)
ANTICOAGULATION CONSULT NOTE - Follow Up Consult  Pharmacy Consult for comadin Indication: VTE prophylaxis  No Known Allergies  Patient Measurements: Height: 5\' 10"  (177.8 cm) Weight: 217 lb 13 oz (98.8 kg) IBW/kg (Calculated) : 73 Heparin Dosing Weight:   Vital Signs: Temp: 98.4 F (36.9 C) (02/16 0607) Temp src: Oral (02/16 0607) BP: 124/67 mmHg (02/16 0607) Pulse Rate: 84 (02/16 0607)  Labs:  Recent Labs  09/09/12 1605 09/09/12 1726 09/10/12 0630 09/11/12 0526  HGB 11.7*  --  11.4* 10.5*  HCT 35.6*  --  34.6* 31.1*  PLT 226  --  236 223  LABPROT  --  13.2 14.0 13.9  INR  --  1.01 1.09 1.08  CREATININE 8.92*  --  10.18* 8.03*    Estimated Creatinine Clearance: 11.4 ml/min (by C-G formula based on Cr of 8.03).   Medications:  Scheduled:  . amLODipine  10 mg Oral Daily  . brimonidine  1 drop Both Eyes BID  . brinzolamide  1 drop Both Eyes BID  . docusate sodium  100 mg Oral BID  . doxercalciferol  6 mcg Intravenous Q T,Th,Sa-HD  . ferric gluconate (FERRLECIT/NULECIT) IV  62.5 mg Intravenous Weekly  . heparin subcutaneous  5,000 Units Subcutaneous Q8H  . insulin aspart  0-5 Units Subcutaneous QHS  . insulin aspart  0-9 Units Subcutaneous TID WC  . labetalol  300 mg Oral BID  . pantoprazole  40 mg Oral Daily  . patient's guide to using coumadin book   Does not apply Once  . polyethylene glycol  68 g Oral Daily  . senna  1 tablet Oral BID  . sodium chloride  3 mL Intravenous Q12H  . Travoprost (BAK Free)  1 drop Both Eyes QHS  . [COMPLETED] warfarin  7.5 mg Oral ONCE-1800  . warfarin   Does not apply Once  . Warfarin - Pharmacist Dosing Inpatient   Does not apply q1800  . [DISCONTINUED] pantoprazole (PROTONIX) IV  40 mg Intravenous QHS   Infusions:  . [DISCONTINUED] 0.45 % NaCl with KCl 20 mEq / L      Assessment: 62 yo male s/p ORIF is currently on subtherapeutic coumadin for VTE prophylaxis.  INR didn't move after 7.5mg  po x1 dose yesterday.  Goal of  Therapy:  INR 2-3 Monitor platelets by anticoagulation protocol: Yes   Plan:  1) Comadin 10mg  po x1 2) INR in am 3) d/c heparin when INR >/= 2 4) educated   Norrine Ballester, Tsz-Yin 09/11/2012,11:20 AM

## 2012-09-11 NOTE — Progress Notes (Signed)
Subjective: No complaints, for surgery today  Objective Vital signs in last 24 hours: Filed Vitals:   09/11/12 0607 09/11/12 0753 09/11/12 1121 09/11/12 1200  BP: 124/67  126/70   Pulse: 84  82   Temp: 98.4 F (36.9 C)     TempSrc: Oral     Resp: 18 17  17   Height:      Weight: 98.8 kg (217 lb 13 oz)     SpO2: 99% 99%  98%   Weight change: -0.92 kg (-2 lb 0.5 oz)  Intake/Output Summary (Last 24 hours) at 09/11/12 1303 Last data filed at 09/10/12 2100  Gross per 24 hour  Intake    100 ml  Output   2898 ml  Net  -2798 ml   Labs: Basic Metabolic Panel:  Recent Labs Lab 09/07/12 2244 09/08/12 0547 09/09/12 0710 09/09/12 1605 09/10/12 0630 09/11/12 0526  NA 137 135 134*  --  134* 132*  K 5.3* 4.5 4.7  --  6.0* 4.7  CL 94* 95* 96  --  94* 91*  CO2 26 22 25   --  21 22  GLUCOSE 173* 158* 174*  --  224* 191*  BUN 62* 64* 41*  --  57* 46*  CREATININE 9.59* 9.97* 8.05* 8.92* 10.18* 8.03*  CALCIUM 10.0 9.4 9.2  --  9.2 9.3  PHOS  --  6.6*  --   --   --   --    Liver Function Tests:  Recent Labs Lab 09/08/12 0547  AST 26  ALT 17  ALKPHOS 86  BILITOT 0.2*  PROT 7.6  ALBUMIN 3.3*   No results found for this basename: LIPASE, AMYLASE,  in the last 168 hours No results found for this basename: AMMONIA,  in the last 168 hours CBC:  Recent Labs Lab 09/07/12 2244  09/09/12 0710 09/09/12 1605 09/10/12 0630 09/11/12 0526  WBC 10.7*  < > 6.0 6.7 7.9 11.6*  NEUTROABS 8.3*  --  3.4  --   --   --   HGB 13.2  < > 11.6* 11.7* 11.4* 10.5*  HCT 39.2  < > 35.8* 35.6* 34.6* 31.1*  MCV 83.9  < > 84.4 85.4 84.4 83.2  PLT 283  < > 222 226 236 223  < > = values in this interval not displayed. PT/INR: @LABRCNTIP (inr:5) Cardiac Enzymes: )No results found for this basename: CKTOTAL, CKMB, CKMBINDEX, TROPONINI,  in the last 168 hours CBG:  Recent Labs Lab 09/10/12 1719 09/10/12 2008 09/10/12 2139 09/11/12 0610 09/11/12 1116  GLUCAP 190* 208* 179* 179* 200*    Iron  Studies: No results found for this basename: IRON, TIBC, TRANSFERRIN, FERRITIN,  in the last 168 hours  Scheduled Meds . amLODipine  10 mg Oral Daily  . brimonidine  1 drop Both Eyes BID  . brinzolamide  1 drop Both Eyes BID  . docusate sodium  100 mg Oral BID  . doxercalciferol  6 mcg Intravenous Q T,Th,Sa-HD  . ferric gluconate (FERRLECIT/NULECIT) IV  62.5 mg Intravenous Weekly  . heparin subcutaneous  5,000 Units Subcutaneous Q8H  . insulin aspart  0-5 Units Subcutaneous QHS  . insulin aspart  0-9 Units Subcutaneous TID WC  . labetalol  300 mg Oral BID  . pantoprazole  40 mg Oral Daily  . patient's guide to using coumadin book   Does not apply Once  . polyethylene glycol  68 g Oral Daily  . senna  1 tablet Oral BID  . sodium chloride  3  mL Intravenous Q12H  . Travoprost (BAK Free)  1 drop Both Eyes QHS  . warfarin  10 mg Oral ONCE-1800  . warfarin   Does not apply Once  . Warfarin - Pharmacist Dosing Inpatient   Does not apply q1800    Physical Exam:  Blood pressure 126/70, pulse 82, temperature 98.4 F (36.9 C), temperature source Oral, resp. rate 17, height 5\' 10"  (1.778 m), weight 98.8 kg (217 lb 13 oz), SpO2 98.00%.  Gen: Alert, no distress   Neck: no JVD Resp: clear  Cardio: regular rate and rhythm, S1, S2 normal GI: soft, non-tender; bowel sounds normal Extremities: left lower leg stabilized, no edema  Neurologic: Grossly normal  Dialysis Access: Right IJ catheter, left BVT (placed 06/14/12 by Dr. Edilia Bo) with + bruit   Dialysis Orders: Center: [place] on TTS.  EDW 99 kg HD Bath 2K/2.25Ca Time 4 hrs Heparin 5000 U. Access Right IJ catheter, also L BVT (placed 06/14/12) BFR 400 DFR 800 Hectorol 6 mcg IV/HD Epogen 0 Units IV/HD Venofer 50 mg on Thurs.   Assessment/Plan:  1. Displaced left tibial fx - s/p ORIF 2/14 2. ESRD, hd TTS 3. HTN/volume- stable on BB/norvasc, at dry wt 4. Anemia - Hgb 11.4, no outpatient Epogen, Venofer 50 mg q wk 5. Metabolic bone disease - Ca  9.4 (10 corrected), P 6.6; Hectorol 6 mcg, Phoslo 2 with meals. 6. Nutrition - Alb 3.3, renal diet. 7. DM - on insulin per primary. 8. Hx CVA - in 07/2009. 9. Hx nephrolithiasis    Vinson Moselle  MD [phone] pgr    [phone] cell 09/11/2012, 9:00 AM

## 2012-09-11 NOTE — Plan of Care (Signed)
Problem: Phase I Progression Outcomes Goal: Voiding-avoid urinary catheter unless indicated Outcome: Completed/Met Date Met:  09/11/12 anuric

## 2012-09-11 NOTE — Clinical Social Work Note (Signed)
CSW consult for SNF. PT recommendation for HH noted. Per MD note, dispo is home vs CIR. CSW signing off as no other CSW needs identified at this time. Please re-consult if CSW needs arise.  Dellie Burns, MSW, LCSWA [phone] (Weekends 8:00am-4:30pm)

## 2012-09-11 NOTE — Progress Notes (Signed)
Physical Therapy Treatment Patient Details Name: Oscar Castillo MRN: [id number] DOB: Feb 28, 1951 Today's Date: 09/11/2012 Time: 7829-5621 PT Time Calculation (min): 14 min  PT Assessment / Plan / Recommendation Comments on Treatment Session  Pt required increased (A) for all mobility today.  Unable to ambulate-- only performed bed>recliner due to decreased safety.   If pt does not demonstrate progression with mobility he will need SNF to maximize functional recovery & safety.      Follow Up Recommendations  Home health PT;Supervision/Assistance - 24 hour;Other (comment) (may need SNF depending on progress)     Does the patient have the potential to tolerate intense rehabilitation     Barriers to Discharge        Equipment Recommendations  Wheelchair (measurements PT)    Recommendations for Other Services OT consult  Frequency Min 5X/week   Plan      Precautions / Restrictions Restrictions LLE Weight Bearing: Non weight bearing       Mobility  Bed Mobility Bed Mobility: Supine to Sit;Sitting - Scoot to Edge of Bed Supine to Sit: 3: Mod assist;HOB flat Sitting - Scoot to Edge of Bed: 4: Min assist Details for Bed Mobility Assistance: Max directional cues for sequencing, increased use of UE's to (A) with transition, & overall technique.  (A) to elevate torso to sitting upright, support to prevent LOB while scooting to EOB, & (A) to manage L LE to EOB/OOB.   Transfers Transfers: Sit to Stand;Stand to Sit;Stand Pivot Transfers Sit to Stand: 3: Mod assist;With upper extremity assist;From bed Stand to Sit: 3: Mod assist;With upper extremity assist;With armrests;To chair/3-in-1;To bed Stand Pivot Transfers: 3: Mod assist Details for Transfer Assistance: (A) to achieve standing, balance, RW management during pivot, & controlled descent.  Pt not adhering to NWBing, leans hard to Rt & backwards with little attempt to self-correct, & attempts to sit before ensuring safe body positioning.    Ambulation/Gait Ambulation/Gait Assistance: Not tested (comment)      PT Goals Acute Rehab PT Goals Time For Goal Achievement: 09/17/12 Potential to Achieve Goals: Good Pt will go Supine/Side to Sit: with supervision PT Goal: Supine/Side to Sit - Progress: Not progressing Pt will go Sit to Stand: with supervision PT Goal: Sit to Stand - Progress: Not progressing Pt will Ambulate: 16 - 50 feet;with min assist;with rolling walker Pt will Go Up / Down Stairs: 1-2 stairs;with min assist;with rolling walker Pt will Propel Wheelchair: 51 - 150 feet;with supervision  Visit Information  Last PT Received On: 09/11/12 Assistance Needed: +2 (safety if attempting ambulation)    Subjective Data      Cognition  Cognition Overall Cognitive Status: Impaired Area of Impairment: Following commands;Safety/judgement;Problem solving Arousal/Alertness: Awake/alert Orientation Level: Appears intact for tasks assessed Behavior During Session: Flat affect Following Commands: Follows multi-step commands inconsistently Safety/Judgement: Decreased awareness of safety precautions Safety/Judgement - Other Comments: Pt not adhering to NWBing restriciton Problem Solving: Pt slow to process & required max cueing for tasks    Balance     End of Session PT - End of Session Equipment Utilized During Treatment: Gait belt Patient left: in chair;with call bell/phone within reach;with family/visitor present Nurse Communication: Mobility status     Verdell Face, [place] [phone] 09/11/2012

## 2012-09-12 ENCOUNTER — Encounter (HOSPITAL_COMMUNITY): Payer: Self-pay | Admitting: *Deleted

## 2012-09-12 ENCOUNTER — Inpatient Hospital Stay (HOSPITAL_COMMUNITY)
Admission: RE | Admit: 2012-09-12 | Discharge: 2012-09-23 | DRG: 945 | Disposition: A | Discharge status: Home Health | Payer: Medicare Other | Source: Intra-hospital | Attending: Physical Medicine & Rehabilitation | Admitting: Physical Medicine & Rehabilitation

## 2012-09-12 DIAGNOSIS — K219 Gastro-esophageal reflux disease without esophagitis: Secondary | ICD-10-CM | POA: Diagnosis not present

## 2012-09-12 DIAGNOSIS — N2581 Secondary hyperparathyroidism of renal origin: Secondary | ICD-10-CM | POA: Diagnosis not present

## 2012-09-12 DIAGNOSIS — Z79899 Other long term (current) drug therapy: Secondary | ICD-10-CM | POA: Diagnosis not present

## 2012-09-12 DIAGNOSIS — N186 End stage renal disease: Secondary | ICD-10-CM | POA: Diagnosis not present

## 2012-09-12 DIAGNOSIS — I10 Essential (primary) hypertension: Secondary | ICD-10-CM

## 2012-09-12 DIAGNOSIS — Z8673 Personal history of transient ischemic attack (TIA), and cerebral infarction without residual deficits: Secondary | ICD-10-CM

## 2012-09-12 DIAGNOSIS — E1142 Type 2 diabetes mellitus with diabetic polyneuropathy: Secondary | ICD-10-CM

## 2012-09-12 DIAGNOSIS — Z5189 Encounter for other specified aftercare: Principal | ICD-10-CM

## 2012-09-12 DIAGNOSIS — M549 Dorsalgia, unspecified: Secondary | ICD-10-CM

## 2012-09-12 DIAGNOSIS — G8929 Other chronic pain: Secondary | ICD-10-CM

## 2012-09-12 DIAGNOSIS — I120 Hypertensive chronic kidney disease with stage 5 chronic kidney disease or end stage renal disease: Secondary | ICD-10-CM | POA: Diagnosis not present

## 2012-09-12 DIAGNOSIS — B1920 Unspecified viral hepatitis C without hepatic coma: Secondary | ICD-10-CM

## 2012-09-12 DIAGNOSIS — E1129 Type 2 diabetes mellitus with other diabetic kidney complication: Secondary | ICD-10-CM

## 2012-09-12 DIAGNOSIS — Z794 Long term (current) use of insulin: Secondary | ICD-10-CM

## 2012-09-12 DIAGNOSIS — S82209A Unspecified fracture of shaft of unspecified tibia, initial encounter for closed fracture: Secondary | ICD-10-CM

## 2012-09-12 DIAGNOSIS — W19XXXA Unspecified fall, initial encounter: Secondary | ICD-10-CM

## 2012-09-12 DIAGNOSIS — N400 Enlarged prostate without lower urinary tract symptoms: Secondary | ICD-10-CM

## 2012-09-12 DIAGNOSIS — M7989 Other specified soft tissue disorders: Secondary | ICD-10-CM | POA: Diagnosis not present

## 2012-09-12 DIAGNOSIS — I5032 Chronic diastolic (congestive) heart failure: Secondary | ICD-10-CM

## 2012-09-12 DIAGNOSIS — D631 Anemia in chronic kidney disease: Secondary | ICD-10-CM

## 2012-09-12 DIAGNOSIS — S8263XA Displaced fracture of lateral malleolus of unspecified fibula, initial encounter for closed fracture: Secondary | ICD-10-CM

## 2012-09-12 DIAGNOSIS — B351 Tinea unguium: Secondary | ICD-10-CM | POA: Diagnosis not present

## 2012-09-12 DIAGNOSIS — Z992 Dependence on renal dialysis: Secondary | ICD-10-CM | POA: Diagnosis not present

## 2012-09-12 DIAGNOSIS — Z833 Family history of diabetes mellitus: Secondary | ICD-10-CM

## 2012-09-12 DIAGNOSIS — F4323 Adjustment disorder with mixed anxiety and depressed mood: Secondary | ICD-10-CM | POA: Diagnosis not present

## 2012-09-12 DIAGNOSIS — Z8249 Family history of ischemic heart disease and other diseases of the circulatory system: Secondary | ICD-10-CM

## 2012-09-12 DIAGNOSIS — I509 Heart failure, unspecified: Secondary | ICD-10-CM | POA: Diagnosis not present

## 2012-09-12 DIAGNOSIS — W010XXA Fall on same level from slipping, tripping and stumbling without subsequent striking against object, initial encounter: Secondary | ICD-10-CM

## 2012-09-12 DIAGNOSIS — Z7901 Long term (current) use of anticoagulants: Secondary | ICD-10-CM | POA: Diagnosis not present

## 2012-09-12 DIAGNOSIS — Y93H1 Activity, digging, shoveling and raking: Secondary | ICD-10-CM

## 2012-09-12 DIAGNOSIS — Y92009 Unspecified place in unspecified non-institutional (private) residence as the place of occurrence of the external cause: Secondary | ICD-10-CM

## 2012-09-12 DIAGNOSIS — E1149 Type 2 diabetes mellitus with other diabetic neurological complication: Secondary | ICD-10-CM

## 2012-09-12 DIAGNOSIS — S82409A Unspecified fracture of shaft of unspecified fibula, initial encounter for closed fracture: Secondary | ICD-10-CM

## 2012-09-12 DIAGNOSIS — E1165 Type 2 diabetes mellitus with hyperglycemia: Secondary | ICD-10-CM

## 2012-09-12 LAB — PROTIME-INR: Prothrombin Time: 20 seconds — ABNORMAL HIGH (ref 11.6–15.2)

## 2012-09-12 LAB — CBC
Hemoglobin: 9.7 g/dL — ABNORMAL LOW (ref 13.0–17.0)
MCH: 27.4 pg (ref 26.0–34.0)
MCHC: 33.1 g/dL (ref 30.0–36.0)
RDW: 14.5 % (ref 11.5–15.5)

## 2012-09-12 LAB — GLUCOSE, CAPILLARY
Glucose-Capillary: 217 mg/dL — ABNORMAL HIGH (ref 70–99)
Glucose-Capillary: 237 mg/dL — ABNORMAL HIGH (ref 70–99)

## 2012-09-12 MED ORDER — INSULIN ASPART 100 UNIT/ML ~~LOC~~ SOLN: 0.0000 [IU] | Freq: Three times a day (TID) | SUBCUTANEOUS | Status: DC

## 2012-09-12 MED ORDER — LABETALOL HCL 300 MG PO TABS
300.0000 mg | ORAL_TABLET | Freq: Two times a day (BID) | ORAL | Status: DC
  Administered 2012-09-12 – 2012-09-14 (×4): 300 mg via ORAL
  Filled 2012-09-12 (×7): qty 1

## 2012-09-12 MED ORDER — LIDOCAINE-PRILOCAINE 2.5-2.5 % EX CREA: 1.0000 "application " | TOPICAL_CREAM | CUTANEOUS | Status: DC | PRN

## 2012-09-12 MED ORDER — DOXERCALCIFEROL 4 MCG/2ML IV SOLN
6.0000 ug | INTRAVENOUS | Status: DC
  Administered 2012-09-13 – 2012-09-22 (×4): 6 ug via INTRAVENOUS

## 2012-09-12 MED ORDER — SORBITOL 70 % SOLN: 30.0000 mL | Freq: Every day | Status: DC | PRN

## 2012-09-12 MED ORDER — PANTOPRAZOLE SODIUM 40 MG PO TBEC
40.0000 mg | DELAYED_RELEASE_TABLET | Freq: Every day | ORAL | Status: DC
  Administered 2012-09-13 – 2012-09-23 (×11): 40 mg via ORAL
  Filled 2012-09-12 (×11): qty 1

## 2012-09-12 MED ORDER — HEPARIN SODIUM (PORCINE) 1000 UNIT/ML DIALYSIS: 20.0000 [IU]/kg | INTRAMUSCULAR | Status: DC | PRN

## 2012-09-12 MED ORDER — WARFARIN SODIUM 1 MG PO TABS: 1.0000 mg | ORAL_TABLET | Freq: Once | ORAL | Status: DC

## 2012-09-12 MED ORDER — PENTAFLUOROPROP-TETRAFLUOROETH EX AERO: 1.0000 "application " | INHALATION_SPRAY | CUTANEOUS | Status: DC | PRN

## 2012-09-12 MED ORDER — WARFARIN - PHARMACIST DOSING INPATIENT: Freq: Every day | Status: DC

## 2012-09-12 MED ORDER — BRIMONIDINE TARTRATE 0.2 % OP SOLN
1.0000 [drp] | Freq: Two times a day (BID) | OPHTHALMIC | Status: DC
  Administered 2012-09-12 – 2012-09-23 (×22): 1 [drp] via OPHTHALMIC
  Filled 2012-09-12: qty 5

## 2012-09-12 MED ORDER — HEPARIN SODIUM (PORCINE) 5000 UNIT/ML IJ SOLN
5000.0000 [IU] | Freq: Three times a day (TID) | INTRAMUSCULAR | Status: DC
  Administered 2012-09-12 – 2012-09-13 (×2): 5000 [IU] via SUBCUTANEOUS
  Filled 2012-09-12 (×5): qty 1

## 2012-09-12 MED ORDER — LIDOCAINE HCL (PF) 1 % IJ SOLN: 5.0000 mL | INTRAMUSCULAR | Status: DC | PRN

## 2012-09-12 MED ORDER — HYDROCODONE-ACETAMINOPHEN 5-325 MG PO TABS
1.0000 | ORAL_TABLET | Freq: Four times a day (QID) | ORAL | Status: DC | PRN
  Administered 2012-09-13 (×2): 2 via ORAL
  Filled 2012-09-12 (×3): qty 2

## 2012-09-12 MED ORDER — INSULIN ASPART 100 UNIT/ML ~~LOC~~ SOLN: 0.0000 [IU] | Freq: Every day | SUBCUTANEOUS | Status: DC

## 2012-09-12 MED ORDER — ACETAMINOPHEN 325 MG PO TABS: 650.0000 mg | ORAL_TABLET | Freq: Four times a day (QID) | ORAL | Status: DC | PRN

## 2012-09-12 MED ORDER — AMLODIPINE BESYLATE 10 MG PO TABS
10.0000 mg | ORAL_TABLET | Freq: Every day | ORAL | Status: DC
  Administered 2012-09-13 – 2012-09-16 (×4): 10 mg via ORAL
  Filled 2012-09-12 (×5): qty 1

## 2012-09-12 MED ORDER — PANTOPRAZOLE SODIUM 40 MG PO TBEC: 40.0000 mg | DELAYED_RELEASE_TABLET | Freq: Every day | ORAL | Status: DC

## 2012-09-12 MED ORDER — HEPARIN SODIUM (PORCINE) 1000 UNIT/ML DIALYSIS: 1000.0000 [IU] | INTRAMUSCULAR | Status: DC | PRN

## 2012-09-12 MED ORDER — POLYETHYLENE GLYCOL 3350 17 G PO PACK: 17.0000 g | PACK | Freq: Every day | ORAL | Status: DC | PRN

## 2012-09-12 MED ORDER — WARFARIN SODIUM 1 MG PO TABS
1.0000 mg | ORAL_TABLET | Freq: Once | ORAL | Status: AC
  Administered 2012-09-12: 1 mg via ORAL
  Filled 2012-09-12: qty 1

## 2012-09-12 MED ORDER — ONDANSETRON HCL 4 MG PO TABS: 4.0000 mg | ORAL_TABLET | Freq: Four times a day (QID) | ORAL | Status: DC | PRN

## 2012-09-12 MED ORDER — SODIUM CHLORIDE 0.9 % IV SOLN: 100.0000 mL | INTRAVENOUS | Status: DC | PRN

## 2012-09-12 MED ORDER — NEPRO/CARBSTEADY PO LIQD: 237.0000 mL | ORAL | Status: DC | PRN

## 2012-09-12 MED ORDER — WARFARIN SODIUM 10 MG PO TABS: 10.0000 mg | ORAL_TABLET | Freq: Once | ORAL | Status: DC

## 2012-09-12 MED ORDER — POLYETHYLENE GLYCOL 3350 17 G PO PACK
17.0000 g | PACK | Freq: Every day | ORAL | Status: DC | PRN
  Filled 2012-09-12 (×2): qty 1

## 2012-09-12 MED ORDER — ALUM & MAG HYDROXIDE-SIMETH 200-200-20 MG/5ML PO SUSP: 30.0000 mL | ORAL | Status: DC | PRN

## 2012-09-12 MED ORDER — SODIUM CHLORIDE 0.9 % IV SOLN
62.5000 mg | INTRAVENOUS | Status: DC
  Administered 2012-09-15: 62.5 mg via INTRAVENOUS
  Filled 2012-09-12 (×2): qty 5

## 2012-09-12 MED ORDER — ALTEPLASE 2 MG IJ SOLR: 2.0000 mg | Freq: Once | INTRAMUSCULAR | Status: AC | PRN

## 2012-09-12 MED ORDER — INSULIN ASPART 100 UNIT/ML ~~LOC~~ SOLN
0.0000 [IU] | Freq: Every day | SUBCUTANEOUS | Status: DC
  Administered 2012-09-13: 2 [IU] via SUBCUTANEOUS

## 2012-09-12 MED ORDER — POLYETHYLENE GLYCOL 3350 17 G PO PACK
68.0000 g | PACK | Freq: Every day | ORAL | Status: DC
  Administered 2012-09-13: 68 g via ORAL
  Administered 2012-09-14: 17 g via ORAL
  Administered 2012-09-14: 34 g via ORAL
  Administered 2012-09-14: 17 g via ORAL
  Administered 2012-09-15 – 2012-09-16 (×2): 68 g via ORAL
  Administered 2012-09-18: 17 g via ORAL
  Administered 2012-09-19: 68 g via ORAL
  Administered 2012-09-20: 17 g via ORAL
  Filled 2012-09-12 (×11): qty 4

## 2012-09-12 MED ORDER — PATIENT'S GUIDE TO USING COUMADIN BOOK
Freq: Once | Status: DC
  Filled 2012-09-12: qty 1

## 2012-09-12 MED ORDER — DOCUSATE SODIUM 100 MG PO CAPS
100.0000 mg | ORAL_CAPSULE | Freq: Two times a day (BID) | ORAL | Status: DC
  Administered 2012-09-12 – 2012-09-23 (×22): 100 mg via ORAL
  Filled 2012-09-12 (×29): qty 1

## 2012-09-12 MED ORDER — TRAVOPROST (BAK FREE) 0.004 % OP SOLN
1.0000 [drp] | Freq: Every day | OPHTHALMIC | Status: DC
  Administered 2012-09-12 – 2012-09-22 (×11): 1 [drp] via OPHTHALMIC
  Filled 2012-09-12: qty 2.5

## 2012-09-12 MED ORDER — ACETAMINOPHEN 500 MG PO TABS
500.0000 mg | ORAL_TABLET | Freq: Three times a day (TID) | ORAL | Status: DC
  Filled 2012-09-12: qty 1

## 2012-09-12 MED ORDER — WARFARIN SODIUM 1 MG PO TABS
1.0000 mg | ORAL_TABLET | Freq: Once | ORAL | Status: DC
  Filled 2012-09-12: qty 1

## 2012-09-12 MED ORDER — ONDANSETRON HCL 4 MG/2ML IJ SOLN: 4.0000 mg | Freq: Four times a day (QID) | INTRAMUSCULAR | Status: DC | PRN

## 2012-09-12 MED ORDER — BRINZOLAMIDE 1 % OP SUSP
1.0000 [drp] | Freq: Two times a day (BID) | OPHTHALMIC | Status: DC
  Administered 2012-09-12 – 2012-09-23 (×22): 1 [drp] via OPHTHALMIC
  Filled 2012-09-12 (×2): qty 10

## 2012-09-12 MED ORDER — ACETAMINOPHEN 325 MG PO TABS
325.0000 mg | ORAL_TABLET | ORAL | Status: DC | PRN
  Administered 2012-09-14 – 2012-09-22 (×6): 650 mg via ORAL
  Filled 2012-09-12 (×6): qty 2

## 2012-09-12 NOTE — Progress Notes (Addendum)
  VASCULAR AND VEIN SURGERY PROGRESS NOTE  POST-OP HEMODIALYSIS ACCESS  Date of Surgery: 06/14/12 Left BVT by Dr Edilia Bo  HPI: Oscar Castillo is a 62 y.o. male who is 3 months Post-Op creation/revision of left upper extremity Hemodialysis access. The patient denies symptoms of numbness, tingling, weakness; denies pain in the operative limb.  We were asked to assess BVT for maturity.  Significant Diagnostic Studies: CBC Lab Results  Component Value Date   WBC 8.6 09/12/2012   HGB 9.7* 09/12/2012   HCT 29.3* 09/12/2012   MCV 82.8 09/12/2012   PLT 245 09/12/2012    BMET    Component Value Date/Time   NA 132* 09/11/2012 0526   K 4.7 09/11/2012 0526   CL 91* 09/11/2012 0526   CO2 22 09/11/2012 0526   GLUCOSE 191* 09/11/2012 0526   BUN 46* 09/11/2012 0526   CREATININE 8.03* 09/11/2012 0526   CALCIUM 9.3 09/11/2012 0526   GFRNONAA 6* 09/11/2012 0526   GFRAA 7* 09/11/2012 0526    COAG Lab Results  Component Value Date   INR 1.77* 09/12/2012   INR 1.08 09/11/2012   INR 1.09 09/10/2012   No results found for this basename: PTT    Vital Signs  BP Readings from Last 3 Encounters:  09/12/12 133/76  09/12/12 133/76  06/21/12 139/72   Temp Readings from Last 3 Encounters:  09/12/12 98.9 F (37.2 C) Oral  09/12/12 98.9 F (37.2 C) Oral  06/21/12 99.5 F (37.5 C)    SpO2 Readings from Last 3 Encounters:  09/12/12 94%  09/12/12 94%  06/21/12 97%   Pulse Readings from Last 3 Encounters:  09/12/12 85  09/12/12 85  06/21/12 77     Physical Examination  left upper Incision is healing well, skin color is normal , hand grip is 5/5, sensation in digits is intact;  There is a good thrill and good bruit in the left BVT. It is easily palpable and looks to be adequate size..  Assessment/Plan Oscar Castillo is a 62 y.o. year old male who presents 3 months s/p creation/revision of left upper extremity Hemodialysis access. There is an excellent thrill in the fistula and is easily  palpable   ROCZNIAK,REGINA J 09/12/2012 10:57 AM   I have examined the patient, reviewed and agree with above. The patient is status post a basilic vein transposition by Dr. Edilia Bo on 06/14/2012. He is being dialyzed via a right IJ catheter. The patient has a beautiful fistula. It is very superficial, lies very straight in the upper arm and has an excellent thrill. The size is uniform throughout the upper arm at approximately 6 mm. I feel that it is acceptable to access this fistula at any time. Will not follow actively.  [name], MD 09/12/2012 12:49 PM

## 2012-09-12 NOTE — Discharge Summary (Signed)
Physician Discharge Summary  Oscar Castillo WUJ:[id number] DOB: [birth year] DOA: 09/07/2012  PCP: Oliver Barre, MD  Admit date: 09/07/2012 Discharge date: 09/12/2012  Time spent: 35 minutes  Recommendations for Outpatient Follow-up:   Discharge Diagnoses:   Left tibial fracture s/p ORIF    Morbid obesity   Acute combined systolic and diastolic heart failure, NYHA class 2-EF 45%   Diabetes mellitus   ESRD on hemodialysis   HTN (hypertension)   Discharge Condition: good  Diet recommendation: renal   Filed Weights   09/10/12 1637 09/11/12 0607 09/12/12 0607  Weight: 98.4 kg (216 lb 14.9 oz) 98.8 kg (217 lb 13 oz) 101.3 kg (223 lb 5.2 oz)    History of present illness:   62 yo man with ESRD presented with fall around 7 pm as he was cleaning the porch from snow trying to get it ready for going to his HD in AM. He has history of ESRD on HD on Tuesday Thursday and Saturday at [facility]. He has a Permanent catheter in his Right chest. He has a fistula in Left arm that is still maturing. He reports no head injury no LOC. He is able to ambulate well at baseline and reports no exercise induced chest pain or shortness of breath. Denies any hx of hear disease. He thinks he may had a stress test about 2-3 years ago and it was unremarkable.  Of note he has history of diabetes but hadn't been taking any medications for quite some time as he is diabetes, under better control since his kidney failure.   Hospital Course:  1.  Tibia fracture - sp ORIF 09/09/12 by Dr. Dion Saucier -  PT/OT , pain control and CIR 2.  ESRD - HD per [facility]  3. HTN - controlled with HD , amlodipine and labetalol  4. Hyperkalemia -treated  With HD 5. Constipation - Miralax 68 g prescribed 09/11/12  6. DM type 2 - patient used to have severe diabetes. By 05/2012 A1C was less than 6 and patient was without diabetic treatment prior to this admission. CBGs fairly elevated in hospital - A1 C is 7.8 Would recommend patient  stay with SSI novolog during his inpatient rehab stay. Patient could be started on glipizide 2.5 mg daily at Dc from CIR.     Procedures: ORIF  Consultations:  [place]  Discharge Exam: Filed Vitals:   09/12/12 0000 09/12/12 0400 09/12/12 0607 09/12/12 0800  BP:   133/76   Pulse:   85   Temp:   98.9 F (37.2 C)   TempSrc:   Oral   Resp: 16 18 18 17   Height:      Weight:   101.3 kg (223 lb 5.2 oz)   SpO2: 100% 99% 99% 94%    General: aox3 Cardiovascular: rrr, no m,r,g  Respiratory: ctab   Discharge Instructions  Discharge Orders   Future Orders Complete By Expires     Diet - low sodium heart healthy  As directed     Increase activity slowly  As directed     Non weight bearing  As directed         Medication List    STOP taking these medications       traMADol 50 MG tablet  Commonly known as:  ULTRAM      TAKE these medications       acetaminophen 325 MG tablet  Commonly known as:  TYLENOL  Take 2 tablets (650 mg total) by mouth every  6 (six) hours as needed.     amLODipine 10 MG tablet  Commonly known as:  NORVASC  Take 1 tablet (10 mg total) by mouth daily.     aspirin 81 MG EC tablet  Take 81 mg by mouth daily.     calcium acetate 667 MG capsule  Commonly known as:  PHOSLO  Take 2 capsules (1,334 mg total) by mouth 3 (three) times daily with meals.     HYDROcodone-acetaminophen 10-325 MG per tablet  Commonly known as:  NORCO  Take 1-2 tablets by mouth every 6 (six) hours as needed for pain.     insulin aspart 100 UNIT/ML injection  Commonly known as:  novoLOG  Inject 0-9 Units into the skin 3 (three) times daily with meals.     insulin aspart 100 UNIT/ML injection  Commonly known as:  novoLOG  Inject 0-5 Units into the skin at bedtime.     labetalol 300 MG tablet  Commonly known as:  NORMODYNE  TAKE ONE TABLET BY MOUTH TWICE DAILY     pantoprazole 40 MG tablet  Commonly known as:  PROTONIX  Take 1 tablet (40 mg total) by mouth daily.      polyethylene glycol packet  Commonly known as:  MIRALAX / GLYCOLAX  Take 17 g by mouth daily as needed.     sennosides-docusate sodium 8.6-50 MG tablet  Commonly known as:  SENOKOT-S  Take 1 tablet by mouth daily.     SIMBRINZA 1-0.2 % Susp  Generic drug:  Brinzolamide-Brimonidine  Place 1 drop into the right eye 2 (two) times daily.     Travoprost (BAK Free) 0.004 % Soln ophthalmic solution  Commonly known as:  TRAVATAN  Place 1 drop into both eyes at bedtime.     warfarin 5 MG tablet  Commonly known as:  COUMADIN  Take 1 tablet (5 mg total) by mouth daily.     warfarin 1 MG tablet  Commonly known as:  COUMADIN  Take 1 tablet (1 mg total) by mouth one time only at 6 PM.           Follow-up Information   Follow up with Eulas Post, MD. Schedule an appointment as soon as possible for a visit in 2 weeks.   Contact information:   [address] [place] [address] [phone]        The results of significant diagnostics from this hospitalization (including imaging, microbiology, ancillary and laboratory) are listed below for reference.    Significant Diagnostic Studies: Dg Chest 1 View  09/07/2012  *RADIOLOGY REPORT*  Clinical Data: Preoperative chest radiograph for internal fixation of left tibia / fibula fractures.  CHEST - 1 VIEW  Comparison: Chest radiograph performed 06/11/2012  Findings: The lungs are well-aerated and clear.  There is no evidence of focal opacification, pleural effusion or pneumothorax.  The cardiomediastinal silhouette is within normal limits.  No acute osseous abnormalities are seen.  The patient's right-sided dual lumen catheter is noted ending about the cavoatrial junction.  IMPRESSION: No acute cardiopulmonary process seen.   Original Report Authenticated By: Tonia Ghent, M.D.    Dg Tibia/fibula Left  09/09/2012  *RADIOLOGY REPORT*  Clinical Data: Intraoperative fixation of left tibia and fibular fractures  DG C-ARM  61-120 MIN, LEFT TIBIA AND FIBULA - 2 VIEW  Comparison:  Preoperative imaging 09/07/2012  Findings:  Intraoperative fluoroscopic images demonstrate intramedullary rod fixation of the previously seen tibial fracture with fracture fragments in near anatomic alignment.  There has been side plate and screw fixation of the previously seen distal fibular fracture. Fibular fracture fragments are also in near anatomic alignment.  No evidence for hardware failure.  IMPRESSION:  Expected intraoperative appearance after ORIF of left tibial and fibular fractures as above.   Original Report Authenticated By: Christiana Pellant, M.D.    Dg Tibia/fibula Left  09/07/2012  *RADIOLOGY REPORT*  Clinical Data: Status post reduction of tibial and fibular fractures.  LEFT TIBIA AND FIBULA - 2 VIEW  Comparison: Left tibia / fibula radiographs performed earlier today at 09:09 p.m.  Findings: The comminuted fractures through the mid to distal tibial diaphysis and distal fibular diaphysis demonstrate mildly improved alignment.  No new fractures are seen.  The interosseous space is grossly preserved.  The ankle mortise is incompletely assessed, but appears grossly unremarkable.  An overlying splint is noted.  Evaluation of the soft tissues is limited, though diffuse vascular calcifications are seen.  Mild chondrocalcinosis is noted at the knee joint.  IMPRESSION:  1.  Mildly improved alignment of comminuted fractures through the mid to distal tibial diaphysis and distal fibular diaphysis.  No new fractures seen. 2.  Diffuse vascular calcifications noted. 3.  Mild chondrocalcinosis at the knee joint.   Original Report Authenticated By: Tonia Ghent, M.D.    Dg Tibia/fibula Left  09/07/2012  *RADIOLOGY REPORT*  Clinical Data: Fall from porch.  Lower leg injury.  LEFT TIBIA AND FIBULA - 2 VIEW  Comparison: None.  Findings: Comminuted spiral fracture of the distal tibial diaphysis is observed.  Distal fibular metadiaphyseal fracture noted.   Chondrocalcinosis is present in the menisci of the knees.  Vascular calcifications are present.  IMPRESSION:  1.  Distal tibial shaft comminuted spiral fracture. 2.  Suspected oblique fracture of the distal fibular metadiaphysis. 3.  Chondrocalcinosis.   Original Report Authenticated By: Gaylyn Rong, M.D.    Dg Tibia/fibula Left Port  09/09/2012  *RADIOLOGY REPORT*  Clinical Data: History of ORIF.  History of tibial and fibular fractures.  PORTABLE LEFT TIBIA AND FIBULA - 2 VIEW  Comparison: 09/07/2012 study.  Findings: Previously fractures of the diaphyses of tibia and fibula have been demonstrated.  ORIF has been performed.  There is an intramedullary rod within the tibia with two proximal transverse screws and two distal transverse screws.  There is near apposition at the fracture sites with near anatomic alignment.  The fibular fracture is stabilized in apposition with near anatomic alignment by metallic side plate with multiple transverse screws.  IMPRESSION: ORIF has been performed stabilizing fractures of tibia and fibula.   Original Report Authenticated By: Onalee Hua Call    Dg C-arm 61-120 Min  09/09/2012  *RADIOLOGY REPORT*  Clinical Data: Intraoperative fixation of left tibia and fibular fractures  DG C-ARM 61-120 MIN, LEFT TIBIA AND FIBULA - 2 VIEW  Comparison:  Preoperative imaging 09/07/2012  Findings:  Intraoperative fluoroscopic images demonstrate intramedullary rod fixation of the previously seen tibial fracture with fracture fragments in near anatomic alignment.  There has been side plate and screw fixation of the previously seen distal fibular fracture. Fibular fracture fragments are also in near anatomic alignment.  No evidence for hardware failure.  IMPRESSION:  Expected intraoperative appearance after ORIF of left tibial and fibular fractures as above.   Original Report Authenticated By: Christiana Pellant, M.D.     Microbiology: No results found for this or any previous visit (from the  past 240 hour(s)).   Labs: Basic Metabolic Panel:  Recent Labs Lab 09/07/12  2244 09/08/12 0547 09/09/12 0710 09/09/12 1605 09/10/12 0630 09/11/12 0526  NA 137 135 134*  --  134* 132*  K 5.3* 4.5 4.7  --  6.0* 4.7  CL 94* 95* 96  --  94* 91*  CO2 26 22 25   --  21 22  GLUCOSE 173* 158* 174*  --  224* 191*  BUN 62* 64* 41*  --  57* 46*  CREATININE 9.59* 9.97* 8.05* 8.92* 10.18* 8.03*  CALCIUM 10.0 9.4 9.2  --  9.2 9.3  MG  --  2.0  --   --   --   --   PHOS  --  6.6*  --   --   --   --    Liver Function Tests:  Recent Labs Lab 09/08/12 0547  AST 26  ALT 17  ALKPHOS 86  BILITOT 0.2*  PROT 7.6  ALBUMIN 3.3*   No results found for this basename: LIPASE, AMYLASE,  in the last 168 hours No results found for this basename: AMMONIA,  in the last 168 hours CBC:  Recent Labs Lab 09/07/12 2244  09/09/12 0710 09/09/12 1605 09/10/12 0630 09/11/12 0526 09/12/12 0540  WBC 10.7*  < > 6.0 6.7 7.9 11.6* 8.6  NEUTROABS 8.3*  --  3.4  --   --   --   --   HGB 13.2  < > 11.6* 11.7* 11.4* 10.5* 9.7*  HCT 39.2  < > 35.8* 35.6* 34.6* 31.1* 29.3*  MCV 83.9  < > 84.4 85.4 84.4 83.2 82.8  PLT 283  < > 222 226 236 223 245  < > = values in this interval not displayed. Cardiac Enzymes: No results found for this basename: CKTOTAL, CKMB, CKMBINDEX, TROPONINI,  in the last 168 hours BNP: BNP (last 3 results)  Recent Labs  06/11/12 0916  PROBNP 6937.0*   CBG:  Recent Labs Lab 09/11/12 1630 09/11/12 2202 09/12/12 0341 09/12/12 0610 09/12/12 1121  GLUCAP 219* 198* 192* 217* 237*       Signed:  Candie Gintz  [facility] 09/12/2012, 12:11 PM

## 2012-09-12 NOTE — Progress Notes (Signed)
Plan to admit patient to CIR today if Dr Lavera Guise agrees that pt is ready. Met with patient to discuss CIR. Pt would benefit from inpatient rehab prior to returning home. Pt was independent and driving PTA. Pt's wife works full time so pt needs to be modified independent to be safe at home alone. Pt agrees with plan to come to CIR today. Await return call from pt's wife to discuss plans with her.

## 2012-09-12 NOTE — Progress Notes (Signed)
Pt attempted to get out of bed twice during the night. The bed alarm was on and pt did wait until staff was present. The first time he was only able to stand with two person assist at the bedside. He was very unstable. The second time he was able to take five steps, but became very unsteady and had to sit in the chair. He seemed confused initially, but was oriented when questioned. cbg 192. Pt remained sitting in chair without complaint.

## 2012-09-12 NOTE — H&P (Signed)
Physical Medicine and Rehabilitation Admission H&P  Chief Complaint   Patient presents with   .  lower leg injury   :  HPI: Oscar Castillo is a 62 y.o. right-handed male with history of diabetes mellitus with peripheral neuropathy, end-stage renal disease with hemodialysis. Admitted 09/08/2012 after a fall while cleaning the snow from his porch. There was no loss of consciousness. Sustained left tibia and distal fibula fractures. Underwent intramedullary nail, open reduction internal fixation 09/09/2012 per Dr. Dion Saucier. Advised nonweightbearing left lower extremity. Placed on Coumadin for DVT prophylaxis with subcutaneous heparin until INR greater than 2.00. Postoperatively with attempts to get out of bed and bed alarm placed for safety. Renal services followup and hemodialysis ongoing as directed. Physical and occupational therapy evaluations completed. Recommendations were made for physical medicine rehabilitation consult to consider inpatient rehabilitation services. Patient was felt to be a good candidate for inpatient rehabilitation services and was admitted for comprehensive rehabilitation program  Review of Systems  Cardiovascular: Positive for leg swelling.  Gastrointestinal:  Reflux  Musculoskeletal: Positive for back pain.  Psychiatric/Behavioral: Positive for depression.  All other systems reviewed and are negative  Past Medical History   Diagnosis  Date   .  ALLERGIC RHINITIS  05/05/2007   .  BACK PAIN, LUMBAR, CHRONIC  08/06/2009   .  BENIGN PROSTATIC HYPERTROPHY  08/01/2010   .  CEREBROVASCULAR ACCIDENT, HX OF  08/06/2009   .  CHOLELITHIASIS  08/01/2010   .  CONGESTIVE HEART FAILURE  03/18/2009   .  DEPRESSION  03/18/2009   .  DIABETES MELLITUS, TYPE II  03/25/2007   .  ERECTILE DYSFUNCTION  03/25/2007   .  GERD  03/25/2007   .  HEPATITIS C, HX OF  03/25/2007   .  HYPERTENSION  03/25/2007   .  Morbid obesity  03/25/2007   .  NEPHROLITHIASIS, HX OF  03/18/2009   .  ESRD on hemodialysis   09/08/2012     ESRD due to DM/HTN. Started dialysis in November 2013. Has LUA AVF placed Jun 14 2012 which is maturing as of Feb 2014.    Past Surgical History   Procedure  Laterality  Date   .  Nephrectomy       partial RR   .  Av fistula placement   06/14/2012     Procedure: ARTERIOVENOUS (AV) FISTULA CREATION; Surgeon: Chuck Hint, MD; Location: [facility] OR; Service: Vascular; Laterality: Left; Left basilic vein transposition with fistula.    Family History   Problem  Relation  Age of Onset   .  Coronary artery disease  Other    .  Diabetes  Other     Social History: reports that he has never smoked. He does not have any smokeless tobacco history on file. He reports that he does not drink alcohol or use illicit drugs.  Allergies: No Known Allergies  Medications Prior to Admission   Medication  Sig  Dispense  Refill   .  amLODipine (NORVASC) 10 MG tablet  Take 1 tablet (10 mg total) by mouth daily.  90 tablet  3   .  aspirin 81 MG EC tablet  Take 81 mg by mouth daily.     .  Brinzolamide-Brimonidine (SIMBRINZA) 1-0.2 % SUSP  Place 1 drop into the right eye 2 (two) times daily.     .  calcium acetate (PHOSLO) 667 MG capsule  Take 2 capsules (1,334 mg total) by mouth 3 (three) times daily with meals.  180  capsule  0   .  labetalol (NORMODYNE) 300 MG tablet  TAKE ONE TABLET BY MOUTH TWICE DAILY  60 tablet  5   .  traMADol (ULTRAM) 50 MG tablet  Take 50 mg by mouth 4 (four) times daily as needed. For pain     .  Travoprost, BAK Free, (TRAVATAN) 0.004 % SOLN ophthalmic solution  Place 1 drop into both eyes at bedtime.      Home:  Home Living  Lives With: Spouse  Available Help at Discharge: Family;Other (Comment) (wife works during the day)  Type of Home: House  Home Access: Stairs to enter  Secretary/administrator of Steps: 1  Entrance Stairs-Rails: None  Home Layout: One level  Bathroom Shower/Tub: Medical sales representative: Standard  Bathroom Accessibility: Yes  How  Accessible: Accessible via walker  Home Adaptive Equipment: Walker - rolling  Functional History:  Prior Function  Able to Take Stairs?: Yes  Driving: Yes (drove to dialysis)  Vocation: Retired  Functional Status:  Mobility:  Bed Mobility  Bed Mobility: Supine to Sit;Sitting - Scoot to Edge of Bed  Supine to Sit: 3: Mod assist;HOB flat  Sitting - Scoot to Edge of Bed: 4: Min assist  Sit to Supine: 3: Mod assist;HOB flat  Transfers  Transfers: Sit to Stand;Stand to [employer] Transfers  Sit to Stand: 2: Max assist;With upper extremity assist;From bed  Stand to Sit: 3: Mod assist;With upper extremity assist;To bed  Stand Pivot Transfers: 3: Mod assist  Ambulation/Gait  Ambulation/Gait Assistance: Not tested (comment)  Ambulation Distance (Feet): 4 Feet  Assistive device: Rolling walker  Ambulation/Gait Assistance Details: able to maintain NWB on LLE  Gait Pattern: (pt able to hop with very short distance each attempt)   ADL:  ADL  Eating/Feeding: Independent  Where Assessed - Eating/Feeding: Chair  Grooming: Set up;Supervision/safety  Where Assessed - Grooming: Supported sitting  Upper Body Bathing: Minimal assistance  Where Assessed - Upper Body Bathing: Supported sitting  Lower Body Bathing: Maximal assistance  Where Assessed - Lower Body Bathing: Supported sit to stand  Upper Body Dressing: Minimal assistance  Where Assessed - Upper Body Dressing: Supported sitting  Lower Body Dressing: Maximal assistance  Where Assessed - Lower Body Dressing: Supported sit to Scientist, research (life sciences): +2 Total assistance  Toilet Transfer Method: Sit to stand;Stand pivot  Acupuncturist: Other (comment) (chair - bed)  Equipment Used: Gait belt;Rolling walker  Transfers/Ambulation Related to ADLs: +2 MAx A. Pt with posterior lean  ADL Comments: lmited by balance deficit and apparent cognitive deficits  Cognition:  Cognition  Arousal/Alertness: Awake/alert  Orientation  Level: Oriented X4  Cognition  Overall Cognitive Status: Impaired  Area of Impairment: Attention;Following commands;Safety/judgement;Awareness of errors;Awareness of deficits;Problem solving  Arousal/Alertness: Awake/alert  Orientation Level: Appears intact for tasks assessed  Behavior During Session: Flat affect  Current Attention Level: Selective  Following Commands: Follows one step commands consistently  Safety/Judgement: Decreased awareness of safety precautions  Safety/Judgement - Other Comments: difficulty maintaining WBS  Awareness of Errors: Assistance required to identify errors made;Assistance required to correct errors made  Awareness of Errors - Other Comments: unaware of falling posteriorly in sitting  Awareness of Deficits: intellectual awareness  Problem Solving: slow processing. Mod A functional basic  Cognition - Other Comments: ? affected by pain meds and being HD pt  Physical Exam:  Blood pressure 133/76, pulse 85, temperature 98.9 F (37.2 C), temperature source Oral, resp. rate 18, height 5\' 10"  (1.778 m), weight  101.3 kg (223 lb 5.2 oz), SpO2 99.00%.  Physical Exam  Vitals reviewed.  Constitutional: He appears well-developed and well-nourished. No distress.  HENT:  Head: Normocephalic and atraumatic.  Right Ear: External ear normal.  Left Ear: External ear normal.  Nose: Nose normal.  Mouth/Throat: Oropharynx is clear and moist.  Eyes: Conjunctivae and EOM are normal. Right eye exhibits no discharge. Left eye exhibits no discharge. No scleral icterus.  Pupils round and reactive to light  Neck: Neck supple. No JVD present. No tracheal deviation present. No thyromegaly present.  Cardiovascular: Normal rate and regular rhythm. Exam reveals no gallop and no friction rub.  No murmur heard.  Pulmonary/Chest: Effort normal and breath sounds normal. No respiratory distress. He has no wheezes. He has no rales. He exhibits no tenderness.  Abdominal: Soft. Bowel sounds  are normal. He exhibits no distension. There is no tenderness. There is no rebound and no guarding.  Musculoskeletal:  Left leg surgery sites intact. Limb appropriately tender.  Lymphadenopathy:  He has no cervical adenopathy.  Neurological: He is alert. He displays normal reflexes. No cranial nerve deficit. Coordination normal.  Patient was able to provide his name, age and place. He needed some cueing for his home situation of living with his wife. He followed simple commands. UE are 5/5. He is unable to lift leg off chair rest. Moves toes of left foot. RLE is grossly 4- to 4/5. Stocking glove sensory loss in the feet  Skin: He is not diaphoretic.  Left Leg incisions are dressed   Results for orders placed during the hospital encounter of 09/07/12 (from the past 48 hour(s))   GLUCOSE, CAPILLARY Status: Abnormal    Collection Time    09/10/12 11:08 AM   Result  Value  Range    Glucose-Capillary  223 (*)  70 - 99 mg/dL    Comment 1  Documented in Chart     Comment 2  Notify RN    GLUCOSE, CAPILLARY Status: Abnormal    Collection Time    09/10/12 5:19 PM   Result  Value  Range    Glucose-Capillary  190 (*)  70 - 99 mg/dL    Comment 1  Documented in Chart     Comment 2  Notify RN    GLUCOSE, CAPILLARY Status: Abnormal    Collection Time    09/10/12 8:08 PM   Result  Value  Range    Glucose-Capillary  208 (*)  70 - 99 mg/dL   GLUCOSE, CAPILLARY Status: Abnormal    Collection Time    09/10/12 9:39 PM   Result  Value  Range    Glucose-Capillary  179 (*)  70 - 99 mg/dL   CBC Status: Abnormal    Collection Time    09/11/12 5:26 AM   Result  Value  Range    WBC  11.6 (*)  4.0 - 10.5 K/uL    RBC  3.74 (*)  4.22 - 5.81 MIL/uL    Hemoglobin  10.5 (*)  13.0 - 17.0 g/dL    HCT  16.1 (*)  09.6 - 52.0 %    MCV  83.2  78.0 - 100.0 fL    MCH  28.1  26.0 - 34.0 pg    MCHC  33.8  30.0 - 36.0 g/dL    RDW  04.5  40.9 - 81.1 %    Platelets  223  150 - 400 K/uL   BASIC METABOLIC PANEL  Status: Abnormal    Collection  Time    09/11/12 5:26 AM   Result  Value  Range    Sodium  132 (*)  135 - 145 mEq/L    Potassium  4.7  3.5 - 5.1 mEq/L    Chloride  91 (*)  96 - 112 mEq/L    CO2  22  19 - 32 mEq/L    Glucose, Bld  191 (*)  70 - 99 mg/dL    BUN  46 (*)  6 - 23 mg/dL    Creatinine, Ser  1.61 (*)  0.50 - 1.35 mg/dL    Calcium  9.3  8.4 - 10.5 mg/dL    GFR calc non Af Amer  6 (*)  >90 mL/min    GFR calc Af Amer  7 (*)  >90 mL/min    Comment:      The eGFR has been calculated     using the CKD EPI equation.     This calculation has not been     validated in all clinical     situations.     eGFR's persistently     <90 mL/min signify     possible Chronic Kidney Disease.   PROTIME-INR Status: None    Collection Time    09/11/12 5:26 AM   Result  Value  Range    Prothrombin Time  13.9  11.6 - 15.2 seconds    INR  1.08  0.00 - 1.49   GLUCOSE, CAPILLARY Status: Abnormal    Collection Time    09/11/12 6:10 AM   Result  Value  Range    Glucose-Capillary  179 (*)  70 - 99 mg/dL   HEMOGLOBIN W9U Status: Abnormal    Collection Time    09/11/12 9:05 AM   Result  Value  Range    Hemoglobin A1C  7.8 (*)  <5.7 %    Comment:  (NOTE)         According to the ADA Clinical Practice Recommendations for 2011, when     HbA1c is used as a screening test:     >=6.5% Diagnostic of Diabetes Mellitus     (if abnormal result is confirmed)     5.7-6.4% Increased risk of developing Diabetes Mellitus     References:Diagnosis and Classification of Diabetes Mellitus,Diabetes     Care,2011,34(Suppl 1):S62-S69 and Standards of Medical Care in     Diabetes - 2011,Diabetes Care,2011,34 (Suppl 1):S11-S61.    Mean Plasma Glucose  177 (*)  <117 mg/dL   GLUCOSE, CAPILLARY Status: Abnormal    Collection Time    09/11/12 11:16 AM   Result  Value  Range    Glucose-Capillary  200 (*)  70 - 99 mg/dL   GLUCOSE, CAPILLARY Status: Abnormal    Collection Time    09/11/12 4:30 PM   Result  Value   Range    Glucose-Capillary  219 (*)  70 - 99 mg/dL    Comment 1  Notify RN    GLUCOSE, CAPILLARY Status: Abnormal    Collection Time    09/11/12 10:02 PM   Result  Value  Range    Glucose-Capillary  198 (*)  70 - 99 mg/dL   GLUCOSE, CAPILLARY Status: Abnormal    Collection Time    09/12/12 3:41 AM   Result  Value  Range    Glucose-Capillary  192 (*)  70 - 99 mg/dL   CBC Status: Abnormal    Collection Time    09/12/12 5:40 AM   Result  Value  Range    WBC  8.6  4.0 - 10.5 K/uL    RBC  3.54 (*)  4.22 - 5.81 MIL/uL    Hemoglobin  9.7 (*)  13.0 - 17.0 g/dL    HCT  81.1 (*)  91.4 - 52.0 %    MCV  82.8  78.0 - 100.0 fL    MCH  27.4  26.0 - 34.0 pg    MCHC  33.1  30.0 - 36.0 g/dL    RDW  78.2  95.6 - 21.3 %    Platelets  245  150 - 400 K/uL   PROTIME-INR Status: Abnormal    Collection Time    09/12/12 5:40 AM   Result  Value  Range    Prothrombin Time  20.0 (*)  11.6 - 15.2 seconds    INR  1.77 (*)  0.00 - 1.49   GLUCOSE, CAPILLARY Status: Abnormal    Collection Time    09/12/12 6:10 AM   Result  Value  Range    Glucose-Capillary  217 (*)  70 - 99 mg/dL    No results found.  Post Admission Physician Evaluation:  1. Functional deficits secondary to left distal shaft tibia fx, left distal fibular fx after fall. 2. Patient is admitted to receive collaborative, interdisciplinary care between the physiatrist, rehab nursing staff, and therapy team. 3. Patient's level of medical complexity and substantial therapy needs in context of that medical necessity cannot be provided at a lesser intensity of care such as a SNF. 4. Patient has experienced substantial functional loss from his/her baseline which was documented above under the "Functional History" and "Functional Status" headings. Judging by the patient's diagnosis, physical exam, and functional history, the patient has potential for functional progress which will result in measurable gains while on inpatient rehab. These gains will  be of substantial and practical use upon discharge in facilitating mobility and self-care at the household level. 5. Physiatrist will provide 24 hour management of medical needs as well as oversight of the therapy plan/treatment and provide guidance as appropriate regarding the interaction of the two. 6. 24 hour rehab nursing will assist with bladder management, bowel management, safety, skin/wound care, disease management, medication administration and pain management and help integrate therapy concepts, techniques,education, etc. 7. PT will assess and treat for/with: Lower extremity strength, range of motion, stamina, balance, functional mobility, safety, adaptive techniques and equipment, wb precautions, education. Goals are: mod I. 8. OT will assess and treat for/with: ADL's, functional mobility, safety, upper extremity strength, adaptive techniques and equipment, wb precautions, education. Goals are: mod I. 9. SLP will assess and treat for/with: n/a. Goals are: n/a. 10. Case Management and Social Worker will assess and treat for psychological issues and discharge planning. 11. Team conference will be held weekly to assess progress toward goals and to determine barriers to discharge. 12. Patient will receive at least 3 hours of therapy per day at least 5 days per week. 13. ELOS: 7-10 days Prognosis: excellent   Medical Problem List and Plan:  1. Distal shaft tibia fracture, distal fibula fracture. Status post intramedullary nail, ORIF 09/09/2012  2. DVT Prophylaxis/Anticoagulation: Coumadin for DVT prophylaxis. Continue subcutaneous heparin until INR greater than 2.00. Monitor for any signs of bleeding. Check Doppler study  3. Pain Management: Hydrocodone as needed. Monitor with increased activity  4. Neuropsych: This patient is capable of making decisions on his/her own behalf.  5. Diabetes mellitus with peripheral neuropathy. Hemoglobin A1c 7.8. Currently with sliding scale  insulin. Check  blood sugars a.c. and at bedtime.  6. End-stage renal disease. Continue hemodialysis as directed per original services.  7. Hypertension. Norvasc 10 mg daily, labetalol 300 mg twice a day. Monitor with increased activity  8. Chronic anemia. Continue Nulecit weekly with dialysis. Followup CBC with dialysis. Latest hemoglobin 9.7  9. Diastolic congestive heart failure. No signs of fluid overload. Denies any shortness of breath. Follow i's and o's, weights. HD per nephrology   Ranelle Oyster, MD, [place] [facility]   09/12/2012

## 2012-09-12 NOTE — Progress Notes (Signed)
Patient ID: Oscar Castillo, male   DOB: 04-25-1951, 62 y.o.   MRN: [id number]     Subjective:  Patient reports pain as mild to moderate.  He states that he is some better as far as his leg is goes.  Objective:   VITALS:   Filed Vitals:   09/11/12 2130 09/12/12 0000 09/12/12 0400 09/12/12 0607  BP: 132/73   133/76  Pulse: 89   85  Temp:    98.9 F (37.2 C)  TempSrc:    Oral  Resp:  16 18 18   Height:      Weight:    101.3 kg (223 lb 5.2 oz)  SpO2:  100% 99% 99%    ABD soft Sensation intact distally Dorsiflexion/Plantar flexion intact Incision: dressing C/D/I and no drainage Posterior splint is intact and well fitting   Lab Results  Component Value Date   WBC 8.6 09/12/2012   HGB 9.7* 09/12/2012   HCT 29.3* 09/12/2012   MCV 82.8 09/12/2012   PLT 245 09/12/2012     Assessment/Plan: 3 Days Post-Op   Principal Problem:   Left tibial fracture Active Problems:   Morbid obesity   Acute combined systolic and diastolic heart failure, NYHA class 2-EF 45%   Diabetes mellitus   ESRD on hemodialysis   HTN (hypertension)   Advance diet Up with therapy Continue plan per medicine NWB left lower ext.   Haskel Khan 09/12/2012, 8:16 AM   Teryl Lucy, MD Cell [phone] Pager [phone]

## 2012-09-12 NOTE — Plan of Care (Signed)
Overall Plan of Care [facility]) Patient Details Name: Oscar Castillo MRN: [id number] DOB: 1951-02-15  Diagnosis:  Left tib/fib fxs  Co-morbidities: DM, ESRD, DPN, ?depression, prior cva  Functional Problem List  Patient demonstrates impairments in the following areas: Balance, Bladder, Bowel, Cognition, Edema, Endurance, Medication Management, Nutrition, Pain, Safety, Sensory , Skin Integrity and Vision  Basic ADL's: grooming, bathing, dressing and toileting Advanced ADL's: simple meal preparation  Transfers:  bed mobility, bed to chair, toilet, tub/shower, car and furniture Locomotion:  ambulation, wheelchair mobility and stairs  Additional Impairments:  Leisure Awareness  Anticipated Outcomes Item Anticipated Outcome  Eating/Swallowing    Basic self-care  supervision  Tolieting  supervision  Bowel/Bladder  remain continent of bowel and bladder  Transfers  Supervision basic transfers; min A car  Locomotion  Mod I w/c mobility household distance; min A short distance gait  Communication    Cognition    Pain  3 or less on scale 0-10  Safety/Judgment    Other     Therapy Plan: PT Intensity: Minimum of 1-2 x/day ,45 to 90 minutes PT Frequency: 5 out of 7 days PT Duration Estimated Length of Stay: 10-12 days OT Intensity: Minimum of 1-2 x/day, 45 to 90 minutes OT Frequency: 5 out of 7 days OT Duration/Estimated Length of Stay: 2 weeks      Team Interventions: Item RN PT OT SLP SW TR Other  Self Care/Advanced ADL Retraining  x x      Neuromuscular Re-Education  x x      Therapeutic Activities  x x      UE/LE Strength Training/ROM  x x      UE/LE Coordination Activities  x x      Visual/Perceptual Remediation/Compensation         DME/Adaptive Equipment Instruction  x x      Therapeutic Exercise  x x      Balance/Vestibular Training  x x      Patient/Family Education x x x      Cognitive Remediation/Compensation  x x      Functional Mobility Training  x x       Ambulation/Gait Training  x       Museum/gallery curator  x       Wheelchair Propulsion/Positioning  x       Functional Tourist information centre manager Reintegration  x x      Dysphagia/Aspiration Film/video editor         Bladder Management x        Bowel Management x        Disease Management/Prevention  x x      Pain Management x x x      Medication Management x        Skin Care/Wound Management x x       Splinting/Orthotics  x x      Discharge Planning  x x      Psychosocial Support  x x                             Team Discharge Planning: Destination: PT-Home ,OT- Home , SLP-  Projected Follow-up: PT-Home health PT;24 hour supervision/assistance, OT-   , SLP-  Projected Equipment Needs: PT-Wheelchair (measurements);Wheelchair cushion (measurements);Rolling walker with 5" wheels, OT- 3 in 1 bedside comode;Tub/shower bench, SLP-  Patient/family involved in  discharge planning: PT- Patient,  OT-Patient, SLP-   MD ELOS: 10-14 days Medical Rehab Prognosis:  Excellent Assessment: The patient has been admitted for CIR therapies. The team will be addressing, functional mobility, strength, stamina, balance, safety, adaptive techniques/equipment, self-care, bowel and bladder mgt, patient and caregiver education, pain mgt, behavioral mgt. Goals have been set at supervision.  Our intention in bringing him here was to reach mod I goals, but at this point behavior issues are inhibiting. This has been addressed with the patient and wife. We will watch and see if goals can be upgraded.      See Team Conference Notes for weekly updates to the plan of care

## 2012-09-12 NOTE — Progress Notes (Signed)
RN was under patients Worklist on 09/12/2012 and accidentally hit the complete order button under the post dialysis orders, when RN was trying to hit the [facility] button to complete the [facility] Documentation Flow Sheet. I hope this does not mess up any documentation for the HD nurse tomorrow morning. Thank you, Maralyn Sago

## 2012-09-12 NOTE — Progress Notes (Addendum)
 [facility]  Subjective:  Awake, alert, sitting in chair, thinks he'll go to rehab today or tomorrow.   Objective: Vital signs in last 24 hours: Blood pressure 133/76, pulse 85, temperature 98.9 F (37.2 C), temperature source Oral, resp. rate 18, height 5\' 10"  (1.778 m), weight 101.3 kg (223 lb 5.2 oz), SpO2 99.00%.    PHYSICAL EXAM General--as above Chest--clear, HD cath R IJ Heart--no rub Abd--nontender Extr--dressing dry L LE, AVF patent L UE, no edema   Dialysis Orders: Center: [place] on TTS.  EDW 99 kg HD Bath 2K/2.25Ca Time 4 hrs Heparin 5000 U. Access Right IJ catheter, also L BVT (placed 06/14/12) BFR 400 DFR 800 Hectorol 6 mcg IV/HD Epogen 0 Units IV/HD Venofer 50 mg on Thurs.   Lab Results:   Recent Labs Lab 09/08/12 0547 09/09/12 0710 09/09/12 1605 09/10/12 0630 09/11/12 0526  NA 135 134*  --  134* 132*  K 4.5 4.7  --  6.0* 4.7  CL 95* 96  --  94* 91*  CO2 22 25  --  21 22  BUN 64* 41*  --  57* 46*  CREATININE 9.97* 8.05* 8.92* 10.18* 8.03*  GLUCOSE 158* 174*  --  224* 191*  CALCIUM 9.4 9.2  --  9.2 9.3  PHOS 6.6*  --   --   --   --      Recent Labs  09/11/12 0526 09/12/12 0540  WBC 11.6* 8.6  HGB 10.5* 9.7*  HCT 31.1* 29.3*  PLT 223 245     I have reviewed the patient's current medications. Scheduled: . amLODipine  10 mg Oral Daily  . brimonidine  1 drop Both Eyes BID  . brinzolamide  1 drop Both Eyes BID  . docusate sodium  100 mg Oral BID  . doxercalciferol  6 mcg Intravenous Q T,Th,Sa-HD  . ferric gluconate (FERRLECIT/NULECIT) IV  62.5 mg Intravenous Weekly  . heparin subcutaneous  5,000 Units Subcutaneous Q8H  . insulin aspart  0-5 Units Subcutaneous QHS  . insulin aspart  0-9 Units Subcutaneous TID WC  . labetalol  300 mg Oral BID  . pantoprazole  40 mg Oral Daily  . patient's guide to using coumadin book   Does not apply Once  . polyethylene glycol  68 g Oral Daily  . senna  1 tablet Oral BID  . sodium chloride   3 mL Intravenous Q12H  . Travoprost (BAK Free)  1 drop Both Eyes QHS  . warfarin   Does not apply Once  . Warfarin - Pharmacist Dosing Inpatient   Does not apply q1800    Assessment/Plan: 1. Displaced left tibial fx - s/p ORIF 2/14 2. ESRD, hd TTS--scheduled for tomorrow.  BVT in L upper arm is 65 mo old and maturing very slowly.  Will ask [facility] to see whether he needs 2nd stage/fistulagram/other procedure.   3. HTN/volume- stable on BB/norvasc, at dry wt 4. Anemia - Hgb 11.4, no outpatient Epogen, Venofer 50 mg q wk 5. Metabolic bone disease - Ca 9.4 (10 corrected), P 6.6; Hectorol 6 mcg, Phoslo 2 with meals--check phos tomorrow and adjust binders accordingly. 6. Nutrition - Alb 3.3, renal diet. 7. DM - on insulin per primary. 8. Hx CVA - in 07/2009. 9. Hx nephrolithiasis     LOS: 5 days   Ivette Castronova F 09/12/2012,10:12 AM   .labalb

## 2012-09-12 NOTE — PMR Pre-admission (Signed)
PMR Admission Coordinator Pre-Admission Assessment  Patient: Oscar Castillo is an 62 y.o., male MRN: [id number] DOB: 1951/03/19 Height: 5\' 10"  (177.8 cm) Weight: 101.3 kg (223 lb 5.2 oz)              Insurance Information Primary: Medicare Policy#: [id number] Subscriber: Patient Benefits: [employer] Effective Date: 07/28/11 Deductible: $1126 OOP Max: None Life Max: Unlimited CIR: 100% SNF: 100 days LBD:  Outpatient: 80% Copay: 20%  Home Health: 100% DME: 80%  Copay: 20% Providers: Patient's choice  Emergency Contact Information Contact Information   Name Relation Home Work Mobile   Lahti,Esther Spouse [phone]     Current Medical History  Patient Admitting Diagnosis: Left tibia and distal fibula fractures  History of Present Illness: 62 y.o. right-handed male with history of diabetes mellitus with peripheral neuropathy, end-stage renal disease with hemodialysis. Admitted 09/08/2012 after a fall while cleaning the snow from his porch. There was no loss of consciousness. Sustained left tibia and distal fibula fractures. Underwent intramedullary nail, open reduction internal fixation 09/09/2012 per Dr. Dion Saucier. Advised nonweightbearing left lower extremity. Placed on Coumadin for DVT prophylaxis. Postoperatively with attempts to get out of bed and bed alarm placed for safety. Renal services followup and hemodialysis ongoing as directed.   Past Medical History  Past Medical History  Diagnosis Date  . ALLERGIC RHINITIS 05/05/2007  . BACK PAIN, LUMBAR, CHRONIC 08/06/2009  . BENIGN PROSTATIC HYPERTROPHY 08/01/2010  . CEREBROVASCULAR ACCIDENT, HX OF 08/06/2009  . CHOLELITHIASIS 08/01/2010  . CONGESTIVE HEART FAILURE 03/18/2009  . DEPRESSION 03/18/2009  . DIABETES MELLITUS, TYPE II 03/25/2007  . ERECTILE DYSFUNCTION 03/25/2007  . GERD 03/25/2007  . HEPATITIS C, HX OF 03/25/2007  . HYPERTENSION 03/25/2007  . Morbid obesity 03/25/2007  . NEPHROLITHIASIS, HX OF 03/18/2009   . ESRD on hemodialysis 09/08/2012    ESRD due to DM/HTN. Started dialysis in November 2013. Has LUA AVF placed Jun 14 2012 which is maturing as of Feb 2014.     Family History  family history includes Coronary artery disease in his other and Diabetes in his other.  Prior Rehab/Hospitalizations: none   Current Medications  Current facility-administered medications:acetaminophen (TYLENOL) suppository 650 mg, 650 mg, Rectal, Q6H PRN, Therisa Doyne, MD;  acetaminophen (TYLENOL) tablet 500 mg, 500 mg, Oral, TID, Sorin C Laza, MD;  acetaminophen (TYLENOL) tablet 650 mg, 650 mg, Oral, Q6H PRN, Therisa Doyne, MD;  alum & mag hydroxide-simeth (MAALOX/MYLANTA) 200-200-20 MG/5ML suspension 30 mL, 30 mL, Oral, Q4H PRN, Eulas Post, MD amLODipine (NORVASC) tablet 10 mg, 10 mg, Oral, Daily, Therisa Doyne, MD, 10 mg at 09/12/12 1000;  brimonidine (ALPHAGAN) 0.2 % ophthalmic solution 1 drop, 1 drop, Both Eyes, BID, Jessica U Vann, DO, 1 drop at 09/12/12 1001;  brinzolamide (AZOPT) 1 % ophthalmic suspension 1 drop, 1 drop, Both Eyes, BID, Jessica U Vann, DO, 1 drop at 09/12/12 1001 docusate sodium (COLACE) capsule 100 mg, 100 mg, Oral, BID, Therisa Doyne, MD, 100 mg at 09/12/12 1000;  doxercalciferol (HECTOROL) injection 6 mcg, 6 mcg, Intravenous, Q T,Th,Sa-HD, Gerome Apley, PA, 6 mcg at 09/10/12 1624;  ferric gluconate (NULECIT) 62.5 mg in sodium chloride 0.9 % 100 mL IVPB, 62.5 mg, Intravenous, Weekly, Gerome Apley, PA, 62.5 mg at 09/08/12 1730 heparin injection 5,000 Units, 5,000 Units, Subcutaneous, Q8H, Eulas Post, MD, 5,000 Units at 09/12/12 0551;  HYDROcodone-acetaminophen (NORCO/VICODIN) 5-325 MG per tablet 1-2 tablet, 1-2 tablet, Oral, Q6H PRN, Eulas Post, MD, 1 tablet at 09/11/12 2129;  insulin  aspart (novoLOG) injection 0-5 Units, 0-5 Units, Subcutaneous, QHS, Therisa Doyne, MD insulin aspart (novoLOG) injection 0-9 Units, 0-9 Units, Subcutaneous, TID WC, Therisa Doyne, MD, 3 Units at 09/12/12 0744;  labetalol (NORMODYNE) tablet 300 mg, 300 mg, Oral, BID, Therisa Doyne, MD, 300 mg at 09/12/12 1000;  labetalol (NORMODYNE,TRANDATE) injection 10 mg, 10 mg, Intravenous, Q2H PRN, Therisa Doyne, MD, 10 mg at 09/10/12 0600;  menthol-cetylpyridinium (CEPACOL) lozenge 3 mg, 1 lozenge, Oral, PRN, Eulas Post, MD metoCLOPramide (REGLAN) injection 5-10 mg, 5-10 mg, Intravenous, Q8H PRN, Eulas Post, MD;  metoCLOPramide (REGLAN) tablet 5-10 mg, 5-10 mg, Oral, Q8H PRN, Eulas Post, MD;  morphine 2 MG/ML injection 2-4 mg, 2-4 mg, Intravenous, Q4H PRN, Therisa Doyne, MD, 4 mg at 09/10/12 0600;  ondansetron (ZOFRAN) injection 4 mg, 4 mg, Intravenous, Q6H PRN, Therisa Doyne, MD ondansetron (ZOFRAN) tablet 4 mg, 4 mg, Oral, Q6H PRN, Therisa Doyne, MD;  pantoprazole (PROTONIX) EC tablet 40 mg, 40 mg, Oral, Daily, Sorin C Lavera Guise, MD, 40 mg at 09/12/12 1000;  patient's guide to using coumadin book, , Does not apply, Once, Jessica C Carney, PHARMD;  phenol (CHLORASEPTIC) mouth spray 1 spray, 1 spray, Mouth/Throat, PRN, Eulas Post, MD polyethylene glycol (MIRALAX / GLYCOLAX) packet 17 g, 17 g, Oral, Daily PRN, Eulas Post, MD, 17 g at 09/12/12 1000;  polyethylene glycol (MIRALAX / GLYCOLAX) packet 68 g, 68 g, Oral, Daily, Sorin C Laza, MD, 68 g at 09/12/12 1000;  senna (SENOKOT) tablet 8.6 mg, 1 tablet, Oral, BID, Eulas Post, MD, 8.6 mg at 09/12/12 1000;  sodium chloride 0.9 % injection 3 mL, 3 mL, Intravenous, Q12H, Therisa Doyne, MD, 3 mL at 09/12/12 1000 sodium chloride 0.9 % injection 3 mL, 3 mL, Intravenous, PRN, Therisa Doyne, MD;  sorbitol 70 % solution 30 mL, 30 mL, Oral, Daily PRN, Eulas Post, MD;  Travoprost (BAK Free) (TRAVATAN) 0.004 % ophthalmic solution SOLN 1 drop, 1 drop, Both Eyes, QHS, Therisa Doyne, MD, 1 drop at 09/11/12 2130;  warfarin (COUMADIN) tablet 1 mg, 1 mg, Oral, ONCE-1800, Sorin Luanne Bras,  MD warfarin (COUMADIN) video, , Does not apply, Once, Gardner Candle, PHARMD;  Warfarin - Pharmacist Dosing Inpatient, , Does not apply, q1800, Gardner Candle, PHARMD  Patients Current Diet: Renal  Precautions / Restrictions Precautions Precautions: Fall Restrictions Weight Bearing Restrictions: Yes LLE Weight Bearing: Non weight bearing   Prior Activity Level Limited Community (1-2x/wk): Drives self to H.Dyalysis 3X per week. Home Assistive Devices / Equipment Home Assistive Devices/Equipment: Eyeglasses;Dentures (specify type) Home Adaptive Equipment: Walker - rolling  Prior Functional Level Prior Function Level of Independence: Independent Able to Take Stairs?: Yes Driving: Yes (drove to dialysis) Vocation: Retired  Current Functional Level Cognition  Arousal/Alertness: Lethargic Overall Cognitive Status: Impaired Current Attention Level: Selective Attention - Other Comments: Difficult to assess due to pt kept closing eyes. He was unable to answer questions other than his name. Orientation Level: Oriented X4 Following Commands: Follows one step commands inconsistently Safety/Judgement: Decreased awareness of safety precautions Safety/Judgement - Other Comments: difficulty maintaining WBS Awareness of Errors: Assistance required to identify errors made;Assistance required to correct errors made Awareness of Errors - Other Comments: unaware of falling posteriorly in sitting Awareness of Deficits: intellectual awareness Cognition - Other Comments: ? affected by pain meds and being HD pt    Extremity Assessment (includes Sensation/Coordination)  RUE ROM/Strength/Tone: Deficits RUE ROM/Strength/Tone Deficits: WFL with the exception of limited AROM of shoulder @45  RUE Sensation:  WFL - Light Touch;WFL - Proprioception RUE Coordination: WFL - gross/fine motor  RLE ROM/Strength/Tone: WFL for tasks assessed    ADLs  Eating/Feeding: Independent Where Assessed -  Eating/Feeding: Chair Grooming: Set up;Supervision/safety Where Assessed - Grooming: Supported sitting Upper Body Bathing: Minimal assistance Where Assessed - Upper Body Bathing: Supported sitting Lower Body Bathing: Maximal assistance Where Assessed - Lower Body Bathing: Supported sit to stand Upper Body Dressing: Minimal assistance Where Assessed - Upper Body Dressing: Supported sitting Lower Body Dressing: Maximal assistance Where Assessed - Lower Body Dressing: Supported sit to Pharmacist, hospital: +2 Total assistance Toilet Transfer: Patient Percentage: 50% Statistician Method: Sit to stand;Stand pivot Acupuncturist: Other (comment) (chair - bed) Toileting - Clothing Manipulation and Hygiene: Moderate assistance Where Assessed - Toileting Clothing Manipulation and Hygiene: Standing Equipment Used: Gait belt;Rolling walker Transfers/Ambulation Related to ADLs: +2 MAx A. Pt with posterior lean ADL Comments: lmited by balance deficit and apparent cognitive deficits    Mobility  Bed Mobility: Supine to Sit Supine to Sit: 4: Min assist;HOB elevated Sitting - Scoot to Edge of Bed: 4: Min assist Sit to Supine: 3: Mod assist;HOB flat    Transfers  Transfers: Sit to Stand Sit to Stand: 3: Mod assist Stand to Sit: 4: Min assist Stand Pivot Transfers: 3: Mod assist    Ambulation / Gait / Stairs / Wheelchair Mobility  Ambulation/Gait Ambulation/Gait Assistance: Not tested (comment) (pt with dec. arousal and not following commands ) Ambulation Distance (Feet): 4 Feet Assistive device: Rolling walker Ambulation/Gait Assistance Details: able to maintain NWB on LLE Gait Pattern:  (pt able to hop with very short distance each attempt)    Posture / Balance Static Sitting Balance Static Sitting - Balance Support: Bilateral upper extremity supported Static Sitting - Level of Assistance: 5: Stand by assistance Static Sitting - Comment/# of Minutes: 5 Static Standing  Balance Static Standing - Balance Support: Bilateral upper extremity supported Static Standing - Level of Assistance: 4: Min assist    Special needs/care consideration  Dialysis - yes        Days: T-Th-S Skin: LUE incision (AV fistula)  , LLE incision                            Bowel mgmt: LBM 2/13 Bladder mgmt: anuria Diabetic mgmt: yes    Previous Home Environment Living Arrangements: Spouse/significant other Lives With: Spouse Available Help at Discharge: Family;(wife works during the day) Type of Home: House Home Layout: One level Home Access: Stairs to enter Entrance Stairs-Rails: None Secretary/administrator of Steps: 1 Bathroom Shower/Tub: Engineer, manufacturing systems: Standard Bathroom Accessibility: Yes How Accessible: Accessible via walker Home Care Services: No  Discharge Living Setting Plans for Discharge Living Setting: Patient's home;Lives with wife Type of Home at Discharge: House Discharge Home Layout: One level Discharge Home Access: Stairs to enter Entrance Stairs-Rails: None Entrance Stairs-Number of Steps: 2-3 Discharge Bathroom Shower/Tub: Tub/shower unit Discharge Bathroom Toilet: Standard Discharge Bathroom Accessibility: Yes How Accessible: Accessible via walker Do you have any problems obtaining your medications?: No  Social/Family/Support Systems Patient Roles: Spouse Contact Information: [phone] Anticipated Caregiver: Wife Anticipated Caregiver's Contact Information: Darral Dash [phone]  Ability/Limitations of Caregiver: no lifting Caregiver Availability: Intermittent (Wife works full-time but is available to drive pt to HD.) Discharge Plan Discussed with Primary Caregiver: Yes Is Caregiver In Agreement with Plan?: Yes Does Caregiver/Family have Issues with Lodging/Transportation while Pt is in Rehab?: No  Goals/Additional Needs  Patient/Family Goal for Rehab: Mod. independent overall Expected length of stay: 7-10 days Cultural  Considerations: none Dietary Needs: Renal Equipment Needs: TBD Pt/Family Agrees to Admission and willing to participate: Yes Program Orientation Provided & Reviewed with Pt/Caregiver Including Roles  & Responsibilities: Yes   Decrease burden of Care through IP rehab admission: Mod I goals  Possible need for SNF placement upon discharge: Not anticipated  Patient Condition: This patient's condition remains as documented in the Consult dated 09/12/12, in which the Rehabilitation Physician determined and documented that the patient's condition is appropriate for intensive rehabilitative care in an inpatient rehabilitation facility.  Preadmission Screen Completed By:  Meryl Dare, 09/12/2012 12:09 PM ______________________________________________________________________   Discussed status with Dr. Riley Kill on 09/12/12 at 1310 and received telephone approval for admission today.  Admission Coordinator:  Meryl Dare, [id number] Dorna Bloom 09/12/12

## 2012-09-12 NOTE — Progress Notes (Signed)
Physical Therapy Treatment Patient Details Name: Oscar Castillo MRN: [id number] DOB: 1951-05-18 Today's Date: 09/12/2012 Time: 1308-6578 PT Time Calculation (min): 24 min  PT Assessment / Plan / Recommendation Comments on Treatment Session  Pt presents with orientation to self only. Pt required max verbal and tactile cues to maintain arousal. Pt not following commands consistently. Pt was able to transfer to a chair with the RW maintaining NWB. Pt will require +2 assist to advance gait for safety secondary to cognition. At this point pt may require SNF placement. Pt would need to be mod I. for d/c home.    Follow Up Recommendations  SNF     Does the patient have the potential to tolerate intense rehabilitation     Barriers to Discharge        Equipment Recommendations       Recommendations for Other Services    Frequency Min 5X/week   Plan Discharge plan needs to be updated    Precautions / Restrictions Precautions Precautions: Fall Restrictions LLE Weight Bearing: Non weight bearing   Pertinent Vitals/Pain     Mobility  Bed Mobility Bed Mobility: Supine to Sit Supine to Sit: 4: Min assist;HOB elevated Sitting - Scoot to Edge of Bed: 4: Min assist Details for Bed Mobility Assistance: verbal and tactile cues to initiate desired movement Transfers Transfers: Sit to Stand Sit to Stand: 3: Mod assist Stand to Sit: 4: Min assist Stand Pivot Transfers: 3: Mod assist Details for Transfer Assistance: cues for technique and to maintain LE NWB- pt not following commands to attempt further ambulation safely. Pt will need +2 for gait. Ambulation/Gait Ambulation/Gait Assistance: Not tested (comment) (pt with dec. arousal and not following commands )    Exercises     PT Diagnosis:    PT Problem List:   PT Treatment Interventions:     PT Goals    Visit Information  Last PT Received On: 09/12/12 Assistance Needed: +2    Subjective Data      Cognition  Cognition Overall  Cognitive Status: Impaired Area of Impairment: Attention;Memory;Following commands;Safety/judgement;Problem solving Arousal/Alertness: Lethargic Orientation Level: Disoriented to;Place;Time Behavior During Session: Flat affect Attention - Other Comments: Difficult to assess due to pt kept closing eyes. He was unable to answer questions other than his name. Following Commands: Follows one step commands inconsistently Safety/Judgement: Decreased awareness of safety precautions    Balance  Static Sitting Balance Static Sitting - Balance Support: Bilateral upper extremity supported Static Sitting - Level of Assistance: 5: Stand by assistance Static Standing Balance Static Standing - Balance Support: Bilateral upper extremity supported Static Standing - Level of Assistance: 4: Min assist  End of Session PT - End of Session Equipment Utilized During Treatment: Gait belt Activity Tolerance: Other (comment) (limited due to arousal/cognition) Patient left: in chair;with call bell/phone within reach Nurse Communication: Mobility status   GP     Oscar Castillo 09/12/2012, 12:01 PM

## 2012-09-12 NOTE — Progress Notes (Signed)
Patient admit to inpatient rehab at 1630 from [address].  Attempted to orient patient to room/unit at the time, patient non-verbalized d/t sleepy.  Patient awake at 1809, patient alert and oriented.  Admission packet provided with explanation, including safety plan and data collection.  Patient unable to sign safety plan, patient states he can't see without his eyeglasses.

## 2012-09-12 NOTE — Progress Notes (Signed)
Ok for DC from ortho standpoint.

## 2012-09-12 NOTE — Consult Note (Signed)
Physical Medicine and Rehabilitation Consult Reason for Consult: Left tibia and distal fibula fractures Referring Physician: [facility]   HPI: Oscar Castillo is a 62 y.o. right-handed male with history of diabetes mellitus with peripheral neuropathy, end-stage renal disease with hemodialysis. Admitted 09/08/2012 after a fall while cleaning the snow from his porch. There was no loss of consciousness. Sustained left tibia and distal fibula fractures. Underwent intramedullary nail, open reduction internal fixation 09/09/2012 per Dr. Dion Saucier. Advised nonweightbearing left lower extremity. Placed on Coumadin for DVT prophylaxis. Postoperatively with attempts to get out of bed and bed alarm placed for safety. Renal services followup and hemodialysis ongoing as directed. Physical and occupational therapy evaluations completed. Recommendations were made for physical medicine rehabilitation consult to consider inpatient rehabilitation services.   Review of Systems  Cardiovascular: Positive for leg swelling.  Gastrointestinal:       Reflux  Musculoskeletal: Positive for back pain.  Psychiatric/Behavioral: Positive for depression.  All other systems reviewed and are negative.   Past Medical History  Diagnosis Date  . ALLERGIC RHINITIS 05/05/2007  . BACK PAIN, LUMBAR, CHRONIC 08/06/2009  . BENIGN PROSTATIC HYPERTROPHY 08/01/2010  . CEREBROVASCULAR ACCIDENT, HX OF 08/06/2009  . CHOLELITHIASIS 08/01/2010  . CONGESTIVE HEART FAILURE 03/18/2009  . DEPRESSION 03/18/2009  . DIABETES MELLITUS, TYPE II 03/25/2007  . ERECTILE DYSFUNCTION 03/25/2007  . GERD 03/25/2007  . HEPATITIS C, HX OF 03/25/2007  . HYPERTENSION 03/25/2007  . Morbid obesity 03/25/2007  . NEPHROLITHIASIS, HX OF 03/18/2009  . ESRD on hemodialysis 09/08/2012    ESRD due to DM/HTN. Started dialysis in November 2013. Has LUA AVF placed Jun 14 2012 which is maturing as of Feb 2014.    Past Surgical History  Procedure Laterality Date  . Nephrectomy       partial RR  . Av fistula placement  06/14/2012    Procedure: ARTERIOVENOUS (AV) FISTULA CREATION;  Surgeon: Chuck Hint, MD;  Location: [facility];  Service: Vascular;  Laterality: Left;  Left basilic vein transposition with fistula.   Family History  Problem Relation Age of Onset  . Coronary artery disease Other   . Diabetes Other    Social History:  reports that he has never smoked. He does not have any smokeless tobacco history on file. He reports that he does not drink alcohol or use illicit drugs. Allergies: No Known Allergies Medications Prior to Admission  Medication Sig Dispense Refill  . amLODipine (NORVASC) 10 MG tablet Take 1 tablet (10 mg total) by mouth daily.  90 tablet  3  . aspirin 81 MG EC tablet Take 81 mg by mouth daily.        . Brinzolamide-Brimonidine (SIMBRINZA) 1-0.2 % SUSP Place 1 drop into the right eye 2 (two) times daily.      . calcium acetate (PHOSLO) 667 MG capsule Take 2 capsules (1,334 mg total) by mouth 3 (three) times daily with meals.  180 capsule  0  . labetalol (NORMODYNE) 300 MG tablet TAKE ONE TABLET BY MOUTH TWICE DAILY  60 tablet  5  . traMADol (ULTRAM) 50 MG tablet Take 50 mg by mouth 4 (four) times daily as needed. For pain      . Travoprost, BAK Free, (TRAVATAN) 0.004 % SOLN ophthalmic solution Place 1 drop into both eyes at bedtime.        Home: Home Living Lives With: Spouse Available Help at Discharge: Family;Other (Comment) (wife works during the day) Type of Home: House Home Access: Stairs to enter [employer] of  Steps: 1 Entrance Stairs-Rails: None Home Layout: One level Bathroom Shower/Tub: Engineer, manufacturing systems: Standard Bathroom Accessibility: Yes How Accessible: Accessible via walker Home Adaptive Equipment: Walker - rolling  Functional History: Prior Function Able to Take Stairs?: Yes Driving: Yes (drove to dialysis) Vocation: Retired Functional Status:  Mobility: Bed Mobility Bed Mobility:  Supine to Sit;Sitting - Scoot to Edge of Bed Supine to Sit: 3: Mod assist;HOB flat Sitting - Scoot to Edge of Bed: 4: Min assist Sit to Supine: 3: Mod assist;HOB flat Transfers Transfers: Sit to Stand;Stand to [employer] Transfers Sit to Stand: 2: Max assist;With upper extremity assist;From bed Stand to Sit: 3: Mod assist;With upper extremity assist;To bed Stand Pivot Transfers: 3: Mod assist Ambulation/Gait Ambulation/Gait Assistance: Not tested (comment) Ambulation Distance (Feet): 4 Feet Assistive device: Rolling walker Ambulation/Gait Assistance Details: able to maintain NWB on LLE Gait Pattern:  (pt able to hop with very short distance each attempt)    ADL: ADL Eating/Feeding: Independent Where Assessed - Eating/Feeding: Chair Grooming: Set up;Supervision/safety Where Assessed - Grooming: Supported sitting Upper Body Bathing: Minimal assistance Where Assessed - Upper Body Bathing: Supported sitting Lower Body Bathing: Maximal assistance Where Assessed - Lower Body Bathing: Supported sit to stand Upper Body Dressing: Minimal assistance Where Assessed - Upper Body Dressing: Supported sitting Lower Body Dressing: Maximal assistance Where Assessed - Lower Body Dressing: Supported sit to Pharmacist, hospital: +2 Total assistance Toilet Transfer Method: Sit to stand;Stand pivot Acupuncturist: Other (comment) (chair - bed) Equipment Used: Gait belt;Rolling walker Transfers/Ambulation Related to ADLs: +2 MAx A. Pt with posterior lean ADL Comments: lmited by balance deficit and apparent cognitive deficits  Cognition: Cognition Arousal/Alertness: Awake/alert Orientation Level: Oriented X4 Cognition Overall Cognitive Status: Impaired Area of Impairment: Attention;Following commands;Safety/judgement;Awareness of errors;Awareness of deficits;Problem solving Arousal/Alertness: Awake/alert Orientation Level: Appears intact for tasks assessed Behavior During  Session: Flat affect Current Attention Level: Selective Following Commands: Follows one step commands consistently Safety/Judgement: Decreased awareness of safety precautions Safety/Judgement - Other Comments: difficulty maintaining WBS Awareness of Errors: Assistance required to identify errors made;Assistance required to correct errors made Awareness of Errors - Other Comments: unaware of falling posteriorly in sitting Awareness of Deficits: intellectual awareness Problem Solving: slow processing. Mod A functional basic Cognition - Other Comments: ? affected by pain meds and being HD pt  Blood pressure 133/76, pulse 85, temperature 98.9 F (37.2 C), temperature source Oral, resp. rate 18, height 5\' 10"  (1.778 m), weight 101.3 kg (223 lb 5.2 oz), SpO2 99.00%. Physical Exam  Vitals reviewed. Constitutional: He appears well-developed and well-nourished. No distress.  HENT:  Head: Normocephalic and atraumatic.  Right Ear: External ear normal.  Left Ear: External ear normal.  Nose: Nose normal.  Mouth/Throat: Oropharynx is clear and moist.  Eyes: Conjunctivae and EOM are normal. Right eye exhibits no discharge. Left eye exhibits no discharge. No scleral icterus.  Pupils round and reactive to light  Neck: Neck supple. No JVD present. No tracheal deviation present. No thyromegaly present.  Cardiovascular: Normal rate and regular rhythm.  Exam reveals no gallop and no friction rub.   No murmur heard. Pulmonary/Chest: Effort normal and breath sounds normal. No respiratory distress. He has no wheezes. He has no rales. He exhibits no tenderness.  Abdominal: Soft. Bowel sounds are normal. He exhibits no distension. There is no tenderness. There is no rebound and no guarding.  Musculoskeletal:  Left leg surgery sites intact. Limb appropriately tender.  Lymphadenopathy:    He has no cervical adenopathy.  Neurological: He is alert. He displays normal reflexes. No cranial nerve deficit.  Coordination normal.  Patient was able to provide his name, age and place. He needed some cueing for his home situation of living with his wife. He followed simple commands. UE are 5/5. He is unable to lift leg off chair rest. Moves toes of left foot. RLE is grossly 4- to 4/5. Stocking glove sensory loss in the feet  Skin: He is not diaphoretic.  Left Leg incisions are dressed  Psychiatric: He has a normal mood and affect. His behavior is normal. Judgment and thought content normal.    Results for orders placed during the hospital encounter of 09/07/12 (from the past 24 hour(s))  HEMOGLOBIN A1C     Status: Abnormal   Collection Time    09/11/12  9:05 AM      Result Value Range   Hemoglobin A1C 7.8 (*) <5.7 %   Mean Plasma Glucose 177 (*) <117 mg/dL  GLUCOSE, CAPILLARY     Status: Abnormal   Collection Time    09/11/12 11:16 AM      Result Value Range   Glucose-Capillary 200 (*) 70 - 99 mg/dL  GLUCOSE, CAPILLARY     Status: Abnormal   Collection Time    09/11/12  4:30 PM      Result Value Range   Glucose-Capillary 219 (*) 70 - 99 mg/dL   Comment 1 Notify RN    GLUCOSE, CAPILLARY     Status: Abnormal   Collection Time    09/11/12 10:02 PM      Result Value Range   Glucose-Capillary 198 (*) 70 - 99 mg/dL  CBC     Status: Abnormal   Collection Time    09/12/12  5:40 AM      Result Value Range   WBC 8.6  4.0 - 10.5 K/uL   RBC 3.54 (*) 4.22 - 5.81 MIL/uL   Hemoglobin 9.7 (*) 13.0 - 17.0 g/dL   HCT 57.8 (*) 46.9 - 62.9 %   MCV 82.8  78.0 - 100.0 fL   MCH 27.4  26.0 - 34.0 pg   MCHC 33.1  30.0 - 36.0 g/dL   RDW 52.8  41.3 - 24.4 %   Platelets 245  150 - 400 K/uL  PROTIME-INR     Status: Abnormal   Collection Time    09/12/12  5:40 AM      Result Value Range   Prothrombin Time 20.0 (*) 11.6 - 15.2 seconds   INR 1.77 (*) 0.00 - 1.49   No results found.  Assessment/Plan: Diagnosis: left distal shaft tibia fx, left distal fibular fx 1. Does the need for close, 24 hr/day  medical supervision in concert with the patient's rehab needs make it unreasonable for this patient to be served in a less intensive setting? Yes 2. Co-Morbidities requiring supervision/potential complications: ESRD, HTN, depression DM with DPN 3. Due to bladder management, bowel management, safety, skin/wound care, disease management, medication administration, pain management and patient education, does the patient require 24 hr/day rehab nursing? Yes 4. Does the patient require coordinated care of a physician, rehab nurse, PT (1-2 hrs/day, 5 days/week) and OT (1-2 hrs/day, 5 days/week) to address physical and functional deficits in the context of the above medical diagnosis(es)? Yes Addressing deficits in the following areas: balance, endurance, locomotion, strength, transferring, bowel/bladder control, bathing, dressing, feeding, grooming, toileting and psychosocial support 5. Can the patient actively participate in an intensive therapy program of at least 3 hrs  of therapy per day at least 5 days per week? Yes 6. The potential for patient to make measurable gains while on inpatient rehab is excellent 7. Anticipated functional outcomes upon discharge from inpatient rehab are mod I with PT, mod I with OT, n/a with SLP. 8. Estimated rehab length of stay to reach the above functional goals is: 7-10 days 9. Does the patient have adequate social supports to accommodate these discharge functional goals? Yes 10. Anticipated D/C setting: Home 11. Anticipated post D/C treatments: HH therapy 12. Overall Rehab/Functional Prognosis: excellent  RECOMMENDATIONS: This patient's condition is appropriate for continued rehabilitative care in the following setting: CIR Patient has agreed to participate in recommended program. Yes Note that insurance prior authorization may be required for reimbursement for recommended care.  Comment:Rehab RN to follow up.   Ivory Broad, MD     09/12/2012

## 2012-09-12 NOTE — Progress Notes (Signed)
Report given to RN at [facility]. All questions answered. Patient transported IR with RN and NT.

## 2012-09-12 NOTE — Progress Notes (Signed)
CARE MANAGEMENT NOTE 09/12/2012  Patient:  Oscar Castillo, Oscar Castillo   Account Number:  [id number]  Date Initiated:  09/10/2012  Documentation initiated by:  [facility]  Subjective/Objective Assessment:   62 yr old male s/p left tib/fib fracture with IM nailing.     Action/Plan:   patient will go to CIR.   Anticipated DC Date:  09/12/2012   Anticipated DC Plan:  IP REHAB FACILITY      DC Planning Services  CM consult      [facility] Choice  HOME HEALTH   Choice offered to / List presented to:  C-1 Patient        HH arranged  HH - 11 Patient Refused      Status of service:  Completed, signed off Medicare Important Message given?   (If response is "NO", the following Medicare IM given date fields will be blank) Date Medicare IM given:   Date Additional Medicare IM given:    Discharge Disposition:  IP REHAB FACILITY  Per UR Regulation:    If discussed at Long Length of Stay Meetings, dates discussed:    Comments:  09/12/12 11:30 Vance Peper, RN BSN Case Manager Patient will go to CIR for further rehab.

## 2012-09-12 NOTE — Progress Notes (Signed)
ANTICOAGULATION CONSULT NOTE - Follow Up Consult  Pharmacy Consult for comadin Indication: VTE prophylaxis  No Known Allergies  Patient Measurements: Height: 5\' 10"  (177.8 cm) Weight: 223 lb 5.2 oz (101.3 kg) IBW/kg (Calculated) : 73 Heparin Dosing Weight:   Vital Signs: Temp: 98.9 F (37.2 C) (02/17 0607) Temp src: Oral (02/17 0607) BP: 133/76 mmHg (02/17 0607) Pulse Rate: 85 (02/17 0607)  Labs:  Recent Labs  09/09/12 1605  09/10/12 0630 09/11/12 0526 09/12/12 0540  HGB 11.7*  --  11.4* 10.5* 9.7*  HCT 35.6*  --  34.6* 31.1* 29.3*  PLT 226  --  236 223 245  LABPROT  --   < > 14.0 13.9 20.0*  INR  --   < > 1.09 1.08 1.77*  CREATININE 8.92*  --  10.18* 8.03*  --   < > = values in this interval not displayed.  Estimated Creatinine Clearance: 11.5 ml/min (by C-G formula based on Cr of 8.03).   Medications:  Scheduled:  . amLODipine  10 mg Oral Daily  . brimonidine  1 drop Both Eyes BID  . brinzolamide  1 drop Both Eyes BID  . docusate sodium  100 mg Oral BID  . doxercalciferol  6 mcg Intravenous Q T,Th,Sa-HD  . ferric gluconate (FERRLECIT/NULECIT) IV  62.5 mg Intravenous Weekly  . heparin subcutaneous  5,000 Units Subcutaneous Q8H  . insulin aspart  0-5 Units Subcutaneous QHS  . insulin aspart  0-9 Units Subcutaneous TID WC  . labetalol  300 mg Oral BID  . pantoprazole  40 mg Oral Daily  . patient's guide to using coumadin book   Does not apply Once  . polyethylene glycol  68 g Oral Daily  . senna  1 tablet Oral BID  . sodium chloride  3 mL Intravenous Q12H  . Travoprost (BAK Free)  1 drop Both Eyes QHS  . [COMPLETED] warfarin  10 mg Oral ONCE-1800  . warfarin   Does not apply Once  . Warfarin - Pharmacist Dosing Inpatient   Does not apply q1800   Infusions:     Assessment: 62 yo male s/p ORIF is currently on subtherapeutic on coumadin for VTE prophylaxis. Protime however has made big jump from yesterday. Goal of Therapy:  INR 2-3 Monitor platelets by  anticoagulation protocol: Yes   Plan:  1) Comadin 1mg  po x1 2) INR in am 3) d/c heparin when INR >/= 2    Soraiya Ahner Poteet 09/12/2012,10:17 AM

## 2012-09-13 ENCOUNTER — Inpatient Hospital Stay (HOSPITAL_COMMUNITY): Payer: Medicare Other | Admitting: Physical Therapy

## 2012-09-13 ENCOUNTER — Inpatient Hospital Stay (HOSPITAL_COMMUNITY): Payer: Medicare Other

## 2012-09-13 ENCOUNTER — Inpatient Hospital Stay (HOSPITAL_COMMUNITY): Payer: Medicare Other | Admitting: Occupational Therapy

## 2012-09-13 DIAGNOSIS — E1165 Type 2 diabetes mellitus with hyperglycemia: Secondary | ICD-10-CM

## 2012-09-13 DIAGNOSIS — M7989 Other specified soft tissue disorders: Secondary | ICD-10-CM

## 2012-09-13 DIAGNOSIS — S82209A Unspecified fracture of shaft of unspecified tibia, initial encounter for closed fracture: Secondary | ICD-10-CM

## 2012-09-13 DIAGNOSIS — W19XXXA Unspecified fall, initial encounter: Secondary | ICD-10-CM

## 2012-09-13 DIAGNOSIS — E1142 Type 2 diabetes mellitus with diabetic polyneuropathy: Secondary | ICD-10-CM

## 2012-09-13 DIAGNOSIS — S8263XA Displaced fracture of lateral malleolus of unspecified fibula, initial encounter for closed fracture: Secondary | ICD-10-CM

## 2012-09-13 DIAGNOSIS — N186 End stage renal disease: Secondary | ICD-10-CM

## 2012-09-13 LAB — GLUCOSE, CAPILLARY
Glucose-Capillary: 187 mg/dL — ABNORMAL HIGH (ref 70–99)
Glucose-Capillary: 233 mg/dL — ABNORMAL HIGH (ref 70–99)
Glucose-Capillary: 213 mg/dL — ABNORMAL HIGH (ref 70–99)

## 2012-09-13 LAB — CBC
MCH: 28 pg (ref 26.0–34.0)
MCV: 81.5 fL (ref 78.0–100.0)
Platelets: 293 10*3/uL (ref 150–400)
RBC: 3.36 MIL/uL — ABNORMAL LOW (ref 4.22–5.81)
RDW: 14.1 % (ref 11.5–15.5)
WBC: 7 10*3/uL (ref 4.0–10.5)

## 2012-09-13 LAB — PROTIME-INR
INR: 3.25 — ABNORMAL HIGH (ref 0.00–1.49)
Prothrombin Time: 31.4 seconds — ABNORMAL HIGH (ref 11.6–15.2)

## 2012-09-13 LAB — RENAL FUNCTION PANEL
Albumin: 2.9 g/dL — ABNORMAL LOW (ref 3.5–5.2)
CO2: 23 mEq/L (ref 19–32)
Calcium: 9.1 mg/dL (ref 8.4–10.5)
Creatinine, Ser: 12.63 mg/dL — ABNORMAL HIGH (ref 0.50–1.35)
GFR calc Af Amer: 4 mL/min — ABNORMAL LOW (ref >90)
GFR calc non Af Amer: 4 mL/min — ABNORMAL LOW (ref >90)
Sodium: 130 mEq/L — ABNORMAL LOW (ref 135–145)

## 2012-09-13 MED ORDER — DARBEPOETIN ALFA-POLYSORBATE 25 MCG/0.42ML IJ SOLN
12.5000 ug | INTRAMUSCULAR | Status: DC
  Administered 2012-09-20: 12.5 ug via INTRAVENOUS
  Filled 2012-09-13 (×2): qty 0.42

## 2012-09-13 MED ORDER — CALCIUM ACETATE 667 MG PO CAPS: 2001.0000 mg | ORAL_CAPSULE | Freq: Three times a day (TID) | ORAL | Status: DC

## 2012-09-13 MED ORDER — CALCIUM ACETATE 667 MG PO CAPS
1334.0000 mg | ORAL_CAPSULE | Freq: Three times a day (TID) | ORAL | Status: DC
  Administered 2012-09-13 – 2012-09-23 (×27): 1334 mg via ORAL
  Filled 2012-09-13 (×38): qty 2

## 2012-09-13 MED ORDER — DARBEPOETIN ALFA-POLYSORBATE 200 MCG/0.4ML IJ SOLN
INTRAMUSCULAR | Status: AC
  Administered 2012-09-13: 12.5 ug
  Filled 2012-09-13: qty 0.4

## 2012-09-13 MED ORDER — DOXERCALCIFEROL 4 MCG/2ML IV SOLN
INTRAVENOUS | Status: AC
  Administered 2012-09-13: 6 ug via INTRAVENOUS
  Filled 2012-09-13: qty 4

## 2012-09-13 NOTE — Progress Notes (Signed)
Occupational Therapy Session Note  Patient Details  Name: Oscar Castillo MRN: [id number] Date of Birth: [birth year]  Today's Date: 09/13/2012 Time: 1105-1130 Time Calculation (min): 25 min  Skilled Therapeutic Interventions/Progress Updates:   Took pt to the OT gym and attempted to work on UE strengthening and endurance through use of the Ergonometer.  Attempted use however pt with limited attention to peddle the machine at a speed that he would benefit from.  Even when given max assist to start, once left on his own he would stop peddling and sit with his head flexed.  Attempted this for 5-6 mins with rest breaks however pt unable to perform.  Returned to room and worked on wheelchair to bed transfers.  Pt needing total assist for sit to stand from wheelchair on second attempt (first attempt to stand unsuccessful).  Total assist to pivot around to be with use of RW and max instructional cueing to attempt NWBing over the LLE.  Pt with decreased sustained attention during session.  CBG checked and read at 235.  Even after pushing pt back to the room, before getting back in bed, therapist asked him where he was.  He stated "somewhere in the gym".  Once he looked up he said "Oh no I'm back in my room now.    Therapy Documentation Precautions:  Precautions Precautions: Fall Restrictions Weight Bearing Restrictions: Yes LLE Weight Bearing: Non weight bearing  Pain: Pain Assessment Pain Assessment: Faces Pain Score:   3 Faces Pain Scale: Hurts little more Pain Type: Surgical pain Pain Location: Leg Pain Orientation: Left Pain Descriptors: Aching;Jabbing Pain Frequency: Intermittent Pain Onset: On-going Patients Stated Pain Goal: 3 Pain Intervention(s): Repositioned ADL: See FIM for current functional status  Therapy/Group: Individual Therapy  Peter Daquila OTR/L 09/13/2012, 12:49 PM

## 2012-09-13 NOTE — Progress Notes (Signed)
Physical Therapy Note  Patient Details  Name: LAMAR METER MRN: [id number] Date of Birth: 1951-02-20 Today's Date: 09/13/2012  1430-1500 (30 minutes) individual (missed 15 minutes secondary to refusal to participate) Pain: 6/10 LT LE/ premedicated Precautions : NWB LT LE Focus of treatment: bilateral LE AROM/strengthening; sit to stand/ standing RW  Treatment: Pt in bed upon arrival ; very flat affect; bilateral LE AROM/strengthening- hip flexion , SAQs, hip abduction ( pt with minimal active participation); supine to sit min/mod assist ( LE LE + tactile cues to use bed rail) ; sit to stand from raised  Bed mod assist to RW; requires assist to prevent weight bearing LT LE. Pt states he wants to return to supine and refused additional attempts to provide therapy.  Lannette Avellino,JIM 09/13/2012, 3:01 PM

## 2012-09-13 NOTE — Progress Notes (Signed)
Nutrition Brief Note  Patient identified on the Malnutrition Screening Tool (MST) Report. Per chart, pt denies recent unintentional wt loss and poor appetite. Currently eating well.  Body mass index is 31.92 kg/(m^2). Patient meets criteria for Obese Class I based on current BMI.   Current diet order is Renal 60-70, patient is consuming approximately 50-100% of meals at this time. Labs and medications reviewed.   No nutrition interventions warranted at this time. If nutrition issues arise, please consult RD.   MD: Recommend Renal 80-90 diet to better meet kcal and protein needs. Continue Nepro Shake prn to help meet nutritional needs.  Oscar Motto MS, RD, LDN Pager: [phone] After-hours pager: [phone]

## 2012-09-13 NOTE — Progress Notes (Signed)
VASCULAR LAB PRELIMINARY  PRELIMINARY  PRELIMINARY  PRELIMINARY  Bilateral lower extremity venous duplex completed.    Preliminary report:  Right:  No evidence of DVT, superficial thrombosis, or Baker's cyst. Left:  No evidence of DVT noted throughout the thigh and proximal popliteal vein. Unable to evaluated distally due to bandages and half cast that could not be removed per the R. N. Instructions. Also no superficial thrombus was noted in the same regions. Unable to evaluate for a  Baker's cyst.  Latondra Gebhart, RVS 09/13/2012, 1:51 PM

## 2012-09-13 NOTE — Progress Notes (Signed)
ANTICOAGULATION CONSULT NOTE - Follow Up Consult  Pharmacy Consult for comadin Indication: VTE prophylaxis  No Known Allergies  Patient Measurements: Weight: 222 lb 7.1 oz (100.9 kg) Heparin Dosing Weight:   Vital Signs: Temp: 98.5 F (36.9 C) (02/18 0500) Temp src: Oral (02/18 0500) BP: 122/79 mmHg (02/18 0500) Pulse Rate: 80 (02/18 0500)  Labs:  Recent Labs  09/11/12 0526 09/12/12 0540 09/13/12 0830  HGB 10.5* 9.7*  --   HCT 31.1* 29.3*  --   PLT 223 245  --   LABPROT 13.9 20.0* 31.4*  INR 1.08 1.77* 3.25*  CREATININE 8.03*  --   --     The CrCl is unknown because both a height and weight (above a minimum accepted value) are required for this calculation.   Medications:  Scheduled:  . amLODipine  10 mg Oral Daily  . brimonidine  1 drop Both Eyes BID  . brinzolamide  1 drop Both Eyes BID  . calcium acetate  1,334 mg Oral TID WC  . darbepoetin (ARANESP) injection - DIALYSIS  12.5 mcg Intravenous Q Tue-HD  . docusate sodium  100 mg Oral BID  . doxercalciferol  6 mcg Intravenous Q T,Th,Sa-HD  . [START ON 09/15/2012] ferric gluconate (FERRLECIT/NULECIT) IV  62.5 mg Intravenous Weekly  . heparin subcutaneous  5,000 Units Subcutaneous Q8H  . insulin aspart  0-5 Units Subcutaneous QHS  . labetalol  300 mg Oral BID  . pantoprazole  40 mg Oral Daily  . patient's guide to using coumadin book   Does not apply Once  . polyethylene glycol  68 g Oral Daily  . Travoprost (BAK Free)  1 drop Both Eyes QHS  . [COMPLETED] warfarin  1 mg Oral ONCE-1800  . Warfarin - Pharmacist Dosing Inpatient   Does not apply q1800  . [DISCONTINUED] calcium acetate  2,001 mg Oral TID WC  . [DISCONTINUED] warfarin  10 mg Oral ONCE-1800   Infusions:     Assessment: 62 yo male s/p ORIF on coumadin for VTE prophylaxis.  INR is currently supratherapeutic at 3.25, up from 1.77 yesterday.  Patients coumadin dose was decreased yesterday to 1mg  bc of big jump from 1.08 the day before.    Goal of  Therapy:  INR 2-3 Monitor platelets by anticoagulation protocol: Yes   Plan:  1) Hold coumadin today 2) Daily PT/INR 3) D/c heparin   Wendie Simmer, PharmD, BCPS Clinical Pharmacist  Pager: [phone]

## 2012-09-13 NOTE — Progress Notes (Signed)
[facility] Progress Note  Subjective:   Left leg hurts with movement - PT trying to make WC leg rest more comfortable  Objective Filed Vitals:   09/12/12 1631 09/12/12 2152 09/13/12 0500  BP: 161/82 137/84 122/79  Pulse: 80 80 80  Temp: 98.6 F (37 C)  98.5 F (36.9 C)  TempSrc: Oral  Oral  Resp: 17  19  Weight: 97.1 kg (214 lb 1.1 oz)  100.9 kg (222 lb 7.1 oz)  SpO2: 100%  95%   Physical Exam General:NAD Heart: RRR Lungs: no wheezes or rales Abdomen:soft Extremities: right no LE edema; left dressing intact Dialysis Access: right I J and maturing left uper AVF  Dialysis Orders: Center: [place] on TTS.  EDW 99 kg HD Bath 2K/2.25Ca Time 4 hrs Heparin 5000 U. Access Right IJ catheter, also L BVT (placed 06/14/12) BFR 400 DFR 800 Hectorol 6 mcg IV/HD Epogen 0 Units IV/HD Venofer 50 mg on Thurs.   Assessment/Plan: 1.  Displaced left tibial fx - s/p ORIF 2/14 - starting rehab 2. ESRD, hd TTS-- BVT in L upper arm is 21 mo old and maturing very slowly. Dr. Arbie Cookey assessed 2/17 - feels ready to access - will start today with AB 250 and 17 gauge. 3. HTN/volume- stable on BB/norvasc, +/-dry wt 4. Anemia - Hgb 9.7 - dropped post surgery, no outpatient Epogen- resume low dose, Venofer 50 mg q wk 5. Metabolic bone disease -  P 6.6; Hectorol 6 mcg, Phoslo 2 with meals (resume today)--check phos today pre HD but may be ^ due to not receiving phoslo. 6. Nutrition - Alb 3.3, changed to high protein  renal diet.- add multivit 7. DM - on insulin per primary. 8. Hx CVA - in 07/2009. 9. Hx nephrolithiasis  Oscar Slider, PA-C [facility] Beeper [phone] 09/13/2012,10:30 AM  LOS: 1 day  Oscar Castillo is currently lying in bed.  He knows we will try to use AVF (1 needle) today.  Still has R IJ TDC in.  Aranesp and hectotol have been ordered.  Agree with plans.  Additional Objective Labs: Basic Metabolic Panel:  Recent Labs Lab 09/08/12 0547 09/09/12 0710  09/09/12 1605 09/10/12 0630 09/11/12 0526  NA 135 134*  --  134* 132*  K 4.5 4.7  --  6.0* 4.7  CL 95* 96  --  94* 91*  CO2 22 25  --  21 22  GLUCOSE 158* 174*  --  224* 191*  BUN 64* 41*  --  57* 46*  CREATININE 9.97* 8.05* 8.92* 10.18* 8.03*  CALCIUM 9.4 9.2  --  9.2 9.3  PHOS 6.6*  --   --   --   --    Liver Function Tests:  Recent Labs Lab 09/08/12 0547  AST 26  ALT 17  ALKPHOS 86  BILITOT 0.2*  PROT 7.6  ALBUMIN 3.3*  CBC:  Recent Labs Lab 09/07/12 2244  09/09/12 0710 09/09/12 1605 09/10/12 0630 09/11/12 0526 09/12/12 0540  WBC 10.7*  < > 6.0 6.7 7.9 11.6* 8.6  NEUTROABS 8.3*  --  3.4  --   --   --   --   HGB 13.2  < > 11.6* 11.7* 11.4* 10.5* 9.7*  HCT 39.2  < > 35.8* 35.6* 34.6* 31.1* 29.3*  MCV 83.9  < > 84.4 85.4 84.4 83.2 82.8  PLT 283  < > 222 226 236 223 245  < > = values in this interval not displayed. CBG:  Recent Labs  Lab 09/12/12 0341 09/12/12 0610 09/12/12 1121 09/12/12 2124 09/13/12 0717  GLUCAP 192* 217* 237* 178* 187*  Medications:   . amLODipine  10 mg Oral Daily  . brimonidine  1 drop Both Eyes BID  . brinzolamide  1 drop Both Eyes BID  . docusate sodium  100 mg Oral BID  . doxercalciferol  6 mcg Intravenous Q T,Th,Sa-HD  . [START ON 09/15/2012] ferric gluconate (FERRLECIT/NULECIT) IV  62.5 mg Intravenous Weekly  . heparin subcutaneous  5,000 Units Subcutaneous Q8H  . insulin aspart  0-5 Units Subcutaneous QHS  . labetalol  300 mg Oral BID  . pantoprazole  40 mg Oral Daily  . patient's guide to using coumadin book   Does not apply Once  . polyethylene glycol  68 g Oral Daily  . Travoprost (BAK Free)  1 drop Both Eyes QHS  . Warfarin - Pharmacist Dosing Inpatient   Does not apply [phone]

## 2012-09-13 NOTE — Progress Notes (Signed)
Subjective/Complaints: No complaints. Wife just finishing with washing him up. Pain under control. A 12 point review of systems has been performed and if not noted above is otherwise negative.   Objective: Vital Signs: Blood pressure 122/79, pulse 80, temperature 98.5 F (36.9 C), temperature source Oral, resp. rate 19, weight 100.9 kg (222 lb 7.1 oz), SpO2 95.00%. No results found.  Recent Labs  09/11/12 0526 09/12/12 0540  WBC 11.6* 8.6  HGB 10.5* 9.7*  HCT 31.1* 29.3*  PLT 223 245    Recent Labs  09/11/12 0526  NA 132*  K 4.7  CL 91*  GLUCOSE 191*  BUN 46*  CREATININE 8.03*  CALCIUM 9.3   CBG (last 3)   Recent Labs  09/12/12 0610 09/12/12 1121 09/12/12 2124  GLUCAP 217* 237* 178*    Wt Readings from Last 3 Encounters:  09/13/12 100.9 kg (222 lb 7.1 oz)  09/12/12 101.3 kg (223 lb 5.2 oz)  09/12/12 101.3 kg (223 lb 5.2 oz)    Physical Exam:  Constitutional: He appears well-developed and well-nourished. No distress.  HENT:  Head: Normocephalic and atraumatic.  Right Ear: External ear normal.  Left Ear: External ear normal.  Nose: Nose normal.  Mouth/Throat: Oropharynx is clear and moist.  Eyes: Conjunctivae and EOM are normal. Right eye exhibits no discharge. Left eye exhibits no discharge. No scleral icterus.  Pupils round and reactive to light  Neck: Neck supple. No JVD present. No tracheal deviation present. No thyromegaly present.  Cardiovascular: Normal rate and regular rhythm. Exam reveals no gallop and no friction rub.  No murmur heard.  Pulmonary/Chest: Effort normal and breath sounds normal. No respiratory distress. He has no wheezes. He has no rales. He exhibits no tenderness.  Abdominal: Soft. Bowel sounds are normal. He exhibits no distension. There is no tenderness. There is no rebound and no guarding.  Musculoskeletal:  Left leg surgery sites intact. Limb appropriately tender.  Lymphadenopathy:  He has no cervical adenopathy.   Neurological: He is alert. He displays normal reflexes. No cranial nerve deficit. Coordination normal.  Patient was able to provide his name, age and place.  He followed simple commands. UE are 5/5. He is unable to lift leg off chair rest. Moves toes of left foot. RLE is grossly 4- to 4/5. Stocking glove sensory loss in the feet  Skin: He is not diaphoretic.  Left Leg incisions are dressed. onchomycotic toe nails Psych: affect very flat.    Assessment/Plan: 1. Functional deficits secondary to right distal tibia shaft fx, right distal fibula fx which require 3+ hours per day of interdisciplinary therapy in a comprehensive inpatient rehab setting. Physiatrist is providing close team supervision and 24 hour management of active medical problems listed below. Physiatrist and rehab team continue to assess barriers to discharge/monitor patient progress toward functional and medical goals. FIM:                   Comprehension Comprehension Mode: Auditory Comprehension: 3-Understands basic 50 - 74% of the time/requires cueing 25 - 50%  of the time  Expression Expression Mode: Verbal Expression: 3-Expresses basic 50 - 74% of the time/requires cueing 25 - 50% of the time. Needs to repeat parts of sentences.  Social Interaction Social Interaction: 5-Interacts appropriately 90% of the time - Needs monitoring or encouragement for participation or interaction.  Problem Solving Problem Solving: 3-Solves basic 50 - 74% of the time/requires cueing 25 - 49% of the time  Memory Memory: 3-Recognizes or recalls 50 -  74% of the time/requires cueing 25 - 49% of the time  Medical Problem List and Plan:  1. Distal shaft tibia fracture, distal fibula fracture. Status post intramedullary nail, ORIF 09/09/2012  2. DVT Prophylaxis/Anticoagulation: Coumadin for DVT prophylaxis. Continue subcutaneous heparin until INR greater than 2.00. Monitor for any signs of bleeding. Doppler pending today 3. Pain  Management: Hydrocodone as needed. Monitor with increased activity  4. Neuropsych: This patient is capable of making decisions on his/her own behalf.  5. Diabetes mellitus with peripheral neuropathy. Hemoglobin A1c 7.8. Currently with sliding scale insulin. Check blood sugars a.c. and at bedtime. Adjust as we see a trend 6. End-stage renal disease. Continue hemodialysis as directed per original services.  7. Hypertension. Norvasc 10 mg daily, labetalol 300 mg twice a day. Monitor with increased activity  8. Chronic anemia. Continue Nulecit weekly with dialysis. Followup CBC with dialysis. Latest hemoglobin 9.7  9. Diastolic congestive heart failure. No signs of fluid overload. Denies any shortness of breath. Follow i's and o's, weights. HD per nephrology  LOS (Days) 1 A FACE TO FACE EVALUATION WAS PERFORMED  SWARTZ,ZACHARY T 09/13/2012 7:29 AM

## 2012-09-13 NOTE — Evaluation (Signed)
Physical Therapy Assessment and Plan  Patient Details  Name: Oscar Castillo MRN: [id number] Date of Birth: [birth year]  PT Diagnosis: Abnormality of gait, Difficulty walking, Edema, Impaired cognition, Muscle weakness and Pain in LLE Rehab Potential: Good ELOS: 10-12 days   Today's Date: 09/13/2012 Time: 1308-6578 Time Calculation (min): 59 min  Problem List:  Patient Active Problem List  Diagnosis  . Morbid obesity  . ERECTILE DYSFUNCTION  . DEPRESSION  . HYPERTENSION  . Acute combined systolic and diastolic heart failure, NYHA class 2-EF 45%  . GERD  . ABDOMINAL ULTRASOUND, ABNORMAL  . HEPATITIS C, HX OF  . CEREBROVASCULAR ACCIDENT, HX OF  . NEPHROLITHIASIS, HX OF  . CHOLELITHIASIS  . BENIGN PROSTATIC HYPERTROPHY  . Hyperlipidemia  . Bladder neck obstruction  . Anemia due to chronic illness  . LVH (left ventricular hypertrophy)-severe concentric  . Diabetes mellitus  . Left tibial fracture  . ESRD on hemodialysis  . HTN (hypertension)  . Tibia/fibula fracture    Past Medical History:  Past Medical History  Diagnosis Date  . ALLERGIC RHINITIS 05/05/2007  . BACK PAIN, LUMBAR, CHRONIC 08/06/2009  . BENIGN PROSTATIC HYPERTROPHY 08/01/2010  . CEREBROVASCULAR ACCIDENT, HX OF 08/06/2009  . CHOLELITHIASIS 08/01/2010  . CONGESTIVE HEART FAILURE 03/18/2009  . DEPRESSION 03/18/2009  . DIABETES MELLITUS, TYPE II 03/25/2007  . ERECTILE DYSFUNCTION 03/25/2007  . GERD 03/25/2007  . HEPATITIS C, HX OF 03/25/2007  . HYPERTENSION 03/25/2007  . Morbid obesity 03/25/2007  . NEPHROLITHIASIS, HX OF 03/18/2009  . ESRD on hemodialysis 09/08/2012    ESRD due to DM/HTN. Started dialysis in November 2013. Has LUA AVF placed Jun 14 2012 which is maturing as of Feb 2014.    Past Surgical History:  Past Surgical History  Procedure Laterality Date  . Nephrectomy      partial RR  . Av fistula placement  06/14/2012    Procedure: ARTERIOVENOUS (AV) FISTULA CREATION;  Surgeon: Chuck Hint, MD;   Location: [facility] OR;  Service: Vascular;  Laterality: Left;  Left basilic vein transposition with fistula.    Assessment & Plan Clinical Impression: Patient is a 62 y.o. year old male with recent admission to the hospital with history of diabetes mellitus with peripheral neuropathy, end-stage renal disease with hemodialysis. Admitted 09/08/2012 after a fall while cleaning the snow from his porch. There was no loss of consciousness. Sustained left tibia and distal fibula fractures. Underwent intramedullary nail, open reduction internal fixation 09/09/2012 per Dr. Dion Saucier. Advised nonweightbearing left lower extremity. Placed on Coumadin for DVT prophylaxis with subcutaneous heparin until INR greater than 2.00. Postoperatively with attempts to get out of bed and bed alarm placed for safety. Renal services followup and hemodialysis ongoing as directed. Patient transferred to CIR on 09/12/2012 .   Patient currently requires mod to max with mobility secondary to muscle weakness, decreased cardiorespiratoy endurance, decreased problem solving, decreased safety awareness, decreased memory and delayed processing and decreased sitting balance, decreased standing balance, decreased balance strategies and difficulty maintaining precautions.  Prior to hospitalization, patient was independent  with mobility and lived with Spouse in a House  With 1 level.  Home access is 2-3 steps to enter without rails.  Patient will benefit from skilled PT intervention to maximize safe functional mobility, minimize fall risk and decrease caregiver burden for planned discharge home with intermittent assist (wife available in the evenings).  Anticipate patient will benefit from follow up HH at discharge.  PT - End of Session Activity Tolerance: Decreased this session PT  Assessment Rehab Potential: Good Barriers to Discharge: Inaccessible home environment;Decreased caregiver support PT Plan PT Intensity: Minimum of 1-2 x/day ,45 to 90  minutes PT Frequency: 5 out of 7 days PT Duration Estimated Length of Stay: 10-12 days PT Treatment/Interventions: Ambulation/gait training;Balance/vestibular training;Cognitive remediation/compensation;Community reintegration;Discharge planning;Disease management/prevention;DME/adaptive equipment instruction;Functional mobility training;Neuromuscular re-education;Pain management;Patient/family education;Psychosocial support;Skin care/wound management;Splinting/orthotics;Stair training;Therapeutic Activities;Therapeutic Exercise;UE/LE Strength taining/ROM;UE/LE Coordination activities;Wheelchair propulsion/positioning PT Recommendation Follow Up Recommendations: Home health PT;24 hour supervision/assistance Patient destination: Home Equipment Recommended: Wheelchair (measurements);Wheelchair cushion (measurements);Rolling walker with 5" wheels Equipment Details: would recommend ramp at this time due to NWB status (difficulty maintaining) and has 2-3 steps to enter home. Pt is also a HD pt.  Skilled Therapeutic Intervention Individual treatment initiated with focus on sit to stands (cues for hand placement and mod to max A), squat/scoot pivot transfers on level surface, bed mobility, and w/c mobility. Discussed options for stairs as pt difficult maintaining NWB status. Pt appears to have some cognitive impairments upon evaluation and will continue to monitor.  PT Evaluation Precautions/Restrictions Precautions Precautions: Fall Restrictions LLE Weight Bearing: Non weight bearing Pain Pain Assessment Pain Assessment: 0-10 Pain Score:   8 Pain Location: Leg Pain Orientation: Left Pain Descriptors: Aching;Jabbing Pain Frequency: Intermittent Pain Onset: On-going Patients Stated Pain Goal: 3 Pain Intervention(s): Medication (See eMAR) Home Living/Prior Functioning Home Living Lives With: Spouse Available Help at Discharge: Family (wife works during the day) Type of Home: House Home  Access: Stairs to enter Secretary/administrator of Steps: 2-3 steps Entrance Stairs-Rails: None Home Layout: One level Home Adaptive Equipment:  ("had a walker awhile ago") Prior Function Level of Independence: Independent with basic ADLs;Independent with gait;Independent with transfers  Cognition Overall Cognitive Status: Impaired Arousal/Alertness: Lethargic Comments: pt appears somewhat slow to process; difficult with recall of medical history, home environment, etc Sensation Sensation Light Touch: Appears Intact (difficult to assess LLE due to cast and bandage) Coordination Gross Motor Movements are Fluid and Coordinated: Yes (except LLE due to weakness) Motor  Motor Motor: Within Functional Limits     Trunk/Postural Assessment  Cervical Assessment Cervical Assessment: Within Functional Limits Thoracic Assessment Thoracic Assessment: Within Functional Limits (flexed posture) Lumbar Assessment Lumbar Assessment: Within Functional Limits  Balance Static Sitting Balance Static Sitting - Level of Assistance: 5: Stand by assistance Dynamic Sitting Balance Dynamic Sitting - Level of Assistance: 5: Stand by assistance Static Standing Balance Static Standing - Level of Assistance: 4: Min assist Dynamic Standing Balance Dynamic Standing - Level of Assistance: 3: Mod assist Extremity Assessment  RLE Assessment RLE Assessment: Exceptions to [facility] (grossly 4/5; decreased muscular endurance) LLE Assessment LLE Assessment: Exceptions to [facility] (unable to lift against gravity; 2-/5 grossly hip and knee)  FIM:  FIM - Bed/Chair Transfer Bed/Chair Transfer Assistive Devices: Arm rests;Walker Bed/Chair Transfer: 3: Supine > Sit: Mod A (lifting assist/Pt. 50-74%/lift 2 legs;4: Sit > Supine: Min A (steadying pt. > 75%/lift 1 leg);2: Bed > Chair or W/C: Max A (lift and lower assist);2: Chair or W/C > Bed: Max A (lift and lower assist) FIM - Locomotion: Wheelchair Locomotion: Wheelchair: 1:  Travels less than 50 ft with moderate assistance (Pt: 50 - 74%) FIM - Locomotion: Ambulation Locomotion: Ambulation Assistive Devices: Walker - Rolling Locomotion: Ambulation: 1: Travels less than 50 ft with moderate assistance (Pt: 50 - 74%) FIM - Locomotion: Stairs Locomotion: Stairs: 0: Activity did not occur (unsafe to attempt)   Refer to Care Plan for Long Term Goals  Recommendations for other services: None  Discharge Criteria: Patient  will be discharged from PT if patient refuses treatment 3 consecutive times without medical reason, if treatment goals not met, if there is a change in medical status, if patient makes no progress towards goals or if patient is discharged from hospital.  The above assessment, treatment plan, treatment alternatives and goals were discussed and mutually agreed upon: by patient   Tedd Sias 09/13/2012, 10:41 AM

## 2012-09-13 NOTE — Patient Care Conference (Signed)
Inpatient RehabilitationTeam Conference and Plan of Care Update Date: 09/13/2012   Time: 2:50 PM    Patient Name: Oscar Castillo      Medical Record Number: [id number]  Date of Birth: [birth year] Sex: Male         Room/Bed: 4009/4009-01 Payor Info: Payor: MEDICARE  Plan: MEDICARE PART A AND B  Product Type: *No Product type*     Admitting Diagnosis: tib fib  Admit Date/Time:  09/12/2012  4:27 PM Admission Comments: No comment available   Primary Diagnosis:  Tibia/fibula fracture Principal Problem: Tibia/fibula fracture  Patient Active Problem List   Diagnosis Date Noted  . Tibia/fibula fracture 09/13/2012  . Diabetes mellitus 09/08/2012  . Left tibial fracture 09/08/2012  . ESRD on hemodialysis 09/08/2012  . HTN (hypertension) 09/08/2012  . LVH (left ventricular hypertrophy)-severe concentric 06/13/2012  . Anemia due to chronic illness 06/12/2012  . Bladder neck obstruction 05/06/2011  . Hyperlipidemia 01/30/2011  . CHOLELITHIASIS 08/01/2010  . BENIGN PROSTATIC HYPERTROPHY 08/01/2010  . ABDOMINAL ULTRASOUND, ABNORMAL 04/23/2010  . CEREBROVASCULAR ACCIDENT, HX OF 08/06/2009  . DEPRESSION 03/18/2009  . Acute combined systolic and diastolic heart failure, NYHA class 2-EF 45% 03/18/2009  . NEPHROLITHIASIS, HX OF 03/18/2009  . Morbid obesity 03/25/2007  . ERECTILE DYSFUNCTION 03/25/2007  . HYPERTENSION 03/25/2007  . GERD 03/25/2007  . HEPATITIS C, HX OF 03/25/2007    Expected Discharge Date: Expected Discharge Date: 09/23/12  Team Members Present: Physician leading conference: Dr. Faith Rogue Social Worker Present: Amada Jupiter, LCSW Nurse Present: Other (comment) Kennon Portela, RN) PT Present: Karolee Stamps, PT Other (Discipline and Name): Tora Duck, PPS Coordinator     Current Status/Progress Goal Weekly Team Focus  Medical   left distal tib,fib fxs, esrd, anemia, DPN  increase mobility, balance and safety while adhering to ortho precautions  pain mgt, volume mgt, wound  care   Bowel/Bladder   pt oliguric; continent of bowel. LBM noted 2-13 with interventions inplace  remain continent of bowel and bladder  have bowel movements regularly q1-2 days   Swallow/Nutrition/ Hydration             ADL's   total overall  overall supervision  ADL retraining, overall cognition, sit/stands, transfers, overall activity tolerance/endurance, UE strengthening   Mobility   mod to max A transfers; mod A w/c mobility; pain is an issue  S w/c level; min A short distance gait; mod A for 1 step  activity tolerance, pain management, sit to stands, functional strengthening, maintaining NWB status with mobility, w/c mobility, gait and balance   Communication             Safety/Cognition/ Behavioral Observations            Pain   pt complains of pain in LLE; 1-2 vicoden q6hrs PRN  3 or less  assess and monitor pain. medicate as needed   Skin   ACE wrap/splint to LLE  no new skin breakdown while on rehab  assess skin qshift       *See Interdisciplinary Assessment and Plan and progress notes for long and short-term goals  Barriers to Discharge: ortho precautions    Possible Resolutions to Barriers:  adaptive equipment, mod I goals    Discharge Planning/Teaching Needs:  Home with wife who does work f/t - SW still to follow up with her to determine level of support she can provide.      Team Discussion:  Concern over what appears to be lack of engagement with tx -  uncertain on this first day of therapy whether this is behavioral vs. coginitive vs. Depression at play.  Not doing well maintaining NWB.  Therapists concerned that he will not exceed beyond supervision level.  MD hopeful that we might see more improvement as program progresses.  Revisions to Treatment Plan:  None at this time.   Continued Need for Acute Rehabilitation Level of Care: The patient requires daily medical management by a physician with specialized training in physical medicine and rehabilitation for  the following conditions: Daily direction of a multidisciplinary physical rehabilitation program to ensure safe treatment while eliciting the highest outcome that is of practical value to the patient.: Yes Daily medical management of patient stability for increased activity during participation in an intensive rehabilitation regime.: Yes Daily analysis of laboratory values and/or radiology reports with any subsequent need for medication adjustment of medical intervention for : Neurological problems;Post surgical problems (esrd)  Fenix Rorke 09/14/2012, 4:52 PM

## 2012-09-13 NOTE — Evaluation (Addendum)
Occupational Therapy Assessment and Plan  Patient Details  Name: Oscar Castillo MRN: [id number] Date of Birth: 05/31/1951  OT Diagnosis: altered mental status, cognitive deficits, muscle weakness (generalized) and pain in joint Rehab Potential: Rehab Potential: Good ELOS: 2 weeks   Today's Date: 09/13/2012 Time: 0800-0900 Time Calculation (min): 60 min  Problem List:  Patient Active Problem List  Diagnosis  . Morbid obesity  . ERECTILE DYSFUNCTION  . DEPRESSION  . HYPERTENSION  . Acute combined systolic and diastolic heart failure, NYHA class 2-EF 45%  . GERD  . ABDOMINAL ULTRASOUND, ABNORMAL  . HEPATITIS C, HX OF  . CEREBROVASCULAR ACCIDENT, HX OF  . NEPHROLITHIASIS, HX OF  . CHOLELITHIASIS  . BENIGN PROSTATIC HYPERTROPHY  . Hyperlipidemia  . Bladder neck obstruction  . Anemia due to chronic illness  . LVH (left ventricular hypertrophy)-severe concentric  . Diabetes mellitus  . Left tibial fracture  . ESRD on hemodialysis  . HTN (hypertension)  . Tibia/fibula fracture    Past Medical History:  Past Medical History  Diagnosis Date  . ALLERGIC RHINITIS 05/05/2007  . BACK PAIN, LUMBAR, CHRONIC 08/06/2009  . BENIGN PROSTATIC HYPERTROPHY 08/01/2010  . CEREBROVASCULAR ACCIDENT, HX OF 08/06/2009  . CHOLELITHIASIS 08/01/2010  . CONGESTIVE HEART FAILURE 03/18/2009  . DEPRESSION 03/18/2009  . DIABETES MELLITUS, TYPE II 03/25/2007  . ERECTILE DYSFUNCTION 03/25/2007  . GERD 03/25/2007  . HEPATITIS C, HX OF 03/25/2007  . HYPERTENSION 03/25/2007  . Morbid obesity 03/25/2007  . NEPHROLITHIASIS, HX OF 03/18/2009  . ESRD on hemodialysis 09/08/2012    ESRD due to DM/HTN. Started dialysis in November 2013. Has LUA AVF placed Jun 14 2012 which is maturing as of Feb 2014.    Past Surgical History:  Past Surgical History  Procedure Laterality Date  . Nephrectomy      partial RR  . Av fistula placement  06/14/2012    Procedure: ARTERIOVENOUS (AV) FISTULA CREATION;  Surgeon: Chuck Hint, MD;  Location: [facility] OR;  Service: Vascular;  Laterality: Left;  Left basilic vein transposition with fistula.    Assessment & Plan Clinical Impression: Patient is a 62 y.o. year old male with recent admission to the hospital on 09/08/2012 after a fall while cleaning the snow from his porch. There was no loss of consciousness. Sustained left tibia and distal fibula fractures. Underwent intramedullary nail, open reduction internal fixation 09/09/2012 per Dr. Dion Saucier. Advised nonweightbearing left lower extremity.  Patient transferred to CIR on 09/12/2012 .    Patient currently requires max with basic self-care skills secondary to muscle weakness and decreased attention, decreased awareness, decreased problem solving, decreased safety awareness, decreased memory and delayed processing.  Prior to hospitalization, patient could complete ADLS with independent .  Patient will benefit from skilled intervention to decrease level of assist with basic self-care skills, increase independence with basic self-care skills and increase level of independence with iADL prior to discharge home with care partner.  Anticipate patient will require 24 hour supervision and minimal physical assistance and follow up home health.  OT - End of Session Activity Tolerance: Tolerates 10 - 20 min activity with multiple rests Endurance Deficit: Yes Endurance Deficit Description: Pt needing multiple rest breaks after transfers or standing (less than 30 seconds of standing). OT Assessment Rehab Potential: Good Barriers to Discharge: Decreased caregiver support Barriers to Discharge Comments: pt does not have 24 hour supervision OT Plan OT Intensity: Minimum of 1-2 x/day, 45 to 90 minutes OT Frequency: 5 out of 7 days OT Duration/Estimated  Length of Stay: 2 weeks OT Treatment/Interventions: Balance/vestibular training;Discharge planning;DME/adaptive equipment instruction;Psychosocial support;Patient/family education;Pain  management;Neuromuscular re-education;Self Care/advanced ADL retraining;Functional mobility training;Therapeutic Activities;UE/LE Strength taining/ROM;UE/LE Coordination activities;Wheelchair propulsion/positioning OT Recommendation Recommendations for Other Services: Other (comment) (Pt may need cognitive eval from SLP.) Patient destination: Home Equipment Recommended: 3 in 1 bedside comode;Tub/shower bench   Skilled Therapeutic Intervention   OT Evaluation Precautions/Restrictions  Precautions Precautions: Fall Restrictions Weight Bearing Restrictions: Yes LLE Weight Bearing: Non weight bearing  Pain Pain Assessment Pain Assessment: 0-10 Pain Score:   3 Pain Type: Surgical pain Pain Location: Leg Pain Orientation: Left Pain Descriptors: Aching;Jabbing Pain Frequency: Intermittent Pain Onset: On-going Patients Stated Pain Goal: 3 Pain Intervention(s): Medication (See eMAR) Home Living/Prior Functioning Home Living Lives With: Spouse Available Help at Discharge: Family (wife works during the day) Type of Home: House Home Access: Stairs to enter Secretary/administrator of Steps: 2-3 steps Entrance Stairs-Rails: None Home Layout: One level Bathroom Shower/Tub: Engineer, manufacturing systems: Standard Bathroom Accessibility: Yes How Accessible: Accessible via wheelchair Home Adaptive Equipment:  ("had a walker awhile ago") Prior Function Level of Independence: Independent with basic ADLs;Independent with gait;Independent with transfers Able to Take Stairs?: Yes Driving: Yes Vocation: Retired Optometrist - History Baseline Vision: No visual deficits (Per his report he has glasses but does not wear them.) Patient Visual Report: No change from baseline Vision - Assessment Eye Alignment: Within Functional Limits Perception Perception: Within Functional Limits Praxis Praxis: Intact  Cognition Overall Cognitive Status: Impaired Arousal/Alertness:  Lethargic Orientation Level: Oriented to person;Oriented to place;Oriented to situation;Disoriented to time Attention: Focused Focused Attention: Appears intact Memory: Impaired Memory Impairment: Decreased recall of new information Awareness: Impaired Awareness Impairment: Emergent impairment Problem Solving: Impaired Problem Solving Impairment: Functional complex Comments: Pt slow to process at times, decreased awareness of the need for assistance with getting up out of bed or chair.  Also demonstrates flat affect. Sensation Sensation Light Touch: Appears Intact Stereognosis: Appears Intact Hot/Cold: Appears Intact Proprioception: Appears Intact Coordination Gross Motor Movements are Fluid and Coordinated: Yes Fine Motor Movements are Fluid and Coordinated: Yes Motor  Motor Motor: Abnormal postural alignment and control Motor - Skilled Clinical Observations: Sits with his head flexed and slight thoracic rounding. Mobility  Bed Mobility Bed Mobility: Supine to Sit Supine to Sit: HOB flat;3: Mod assist Supine to Sit Details: Manual facilitation for weight shifting;Verbal cues for sequencing Sitting - Scoot to Edge of Bed: 4: Min assist Sitting - Scoot to [place] of Bed Details: Manual facilitation for weight shifting;Verbal cues for sequencing Sit to Supine: 3: Mod assist;HOB flat Sit to Supine - Details: Manual facilitation for placement;Verbal cues for sequencing Transfers Sit to Stand: 1: +1 Total assist;From bed Sit to Stand Details: Manual facilitation for weight shifting Sit to Stand Details (indicate cue type and reason): Pt unable to maintain NWBing over the LLE with sit to stand. Stand to Sit: 2: Max assist Stand to Sit Details (indicate cue type and reason): Manual facilitation for weight shifting  Trunk/Postural Assessment  Cervical Assessment Cervical Assessment: Exceptions to [facility] Cervical Strength Overall Cervical Strength Comments: Pt sits with cervical  flexion Thoracic Assessment Thoracic Assessment: Exceptions to [facility] Thoracic Strength Overall Thoracic Strength Comments: Slight thoracic kyphosis in sitting and standing Lumbar Assessment Lumbar Assessment: Within Functional Limits Postural Control Postural Control: Deficits on evaluation Postural Limitations: Sits in a posterior pelvic tilt  Balance Static Sitting Balance Static Sitting - Balance Support: No upper extremity supported Static Sitting - Level of Assistance: 5: Stand  by assistance Dynamic Sitting Balance Dynamic Sitting - Level of Assistance: 5: Stand by assistance Static Standing Balance Static Standing - Balance Support: No upper extremity supported Static Standing - Level of Assistance: 2: Max assist Dynamic Standing Balance Dynamic Standing - Level of Assistance: 3: Mod assist Extremity/Trunk Assessment RUE Assessment RUE Assessment: Exceptions to [facility] RUE Strength RUE Overall Strength Comments: AROM shoulder flexion 0-120 degrees, all other joints AROM WFLS.  Overall strength 3+/5 throughout. LUE Assessment LUE Assessment: Exceptions to [facility] LUE Strength LUE Overall Strength Comments: AROM shoulder flexion 0-120 degrees, all other joints AROM WFLS.  Overall strength 3+/5 throughout.  FIM:  FIM - Grooming Grooming: 0: Activity did not occur FIM - Bathing Bathing: 0: Activity did not occur FIM - Upper Body Dressing/Undressing Upper body dressing/undressing: 0: Activity did not occur FIM - Lower Body Dressing/Undressing Lower body dressing/undressing: 0: Activity did not occur FIM - Banker Devices: Manufacturing systems engineer Transfer: 3: Supine > Sit: Mod A (lifting assist/Pt. 50-74%/lift 2 legs;4: Sit > Supine: Min A (steadying pt. > 75%/lift 1 leg);2: Bed > Chair or W/C: Max A (lift and lower assist);2: Chair or W/C > Bed: Max A (lift and lower assist)   Refer to Care Plan for Long Term Goals  Recommendations for other  services: None  Discharge Criteria: Patient will be discharged from OT if patient refuses treatment 3 consecutive times without medical reason, if treatment goals not met, if there is a change in medical status, if patient makes no progress towards goals or if patient is discharged from hospital.  The above assessment, treatment plan, treatment alternatives and goals were discussed and mutually agreed upon: by patient During session began education on sit to stand, functional transfers, and use of DME.  Pt needs max instructional cueing to sequence sit to stand, including hand position.  Decreased ability to maintain NWBing over the LLE with sit to stand transitions and attempted hopping on the RLE.    Aprille Sawhney OTR/L 09/13/2012, 12:30 PM

## 2012-09-14 ENCOUNTER — Encounter (HOSPITAL_COMMUNITY): Payer: Self-pay | Admitting: Orthopedic Surgery

## 2012-09-14 ENCOUNTER — Inpatient Hospital Stay (HOSPITAL_COMMUNITY): Payer: Medicare Other | Admitting: Occupational Therapy

## 2012-09-14 ENCOUNTER — Inpatient Hospital Stay (HOSPITAL_COMMUNITY): Payer: Medicare Other

## 2012-09-14 ENCOUNTER — Inpatient Hospital Stay (HOSPITAL_COMMUNITY): Payer: Medicare Other | Admitting: *Deleted

## 2012-09-14 DIAGNOSIS — E1142 Type 2 diabetes mellitus with diabetic polyneuropathy: Secondary | ICD-10-CM

## 2012-09-14 DIAGNOSIS — E1165 Type 2 diabetes mellitus with hyperglycemia: Secondary | ICD-10-CM

## 2012-09-14 DIAGNOSIS — W19XXXA Unspecified fall, initial encounter: Secondary | ICD-10-CM

## 2012-09-14 DIAGNOSIS — S8263XA Displaced fracture of lateral malleolus of unspecified fibula, initial encounter for closed fracture: Secondary | ICD-10-CM

## 2012-09-14 DIAGNOSIS — N186 End stage renal disease: Secondary | ICD-10-CM

## 2012-09-14 DIAGNOSIS — S82209A Unspecified fracture of shaft of unspecified tibia, initial encounter for closed fracture: Secondary | ICD-10-CM

## 2012-09-14 LAB — GLUCOSE, CAPILLARY
Glucose-Capillary: 176 mg/dL — ABNORMAL HIGH (ref 70–99)
Glucose-Capillary: 187 mg/dL — ABNORMAL HIGH (ref 70–99)
Glucose-Capillary: 278 mg/dL — ABNORMAL HIGH (ref 70–99)
Glucose-Capillary: 181 mg/dL — ABNORMAL HIGH (ref 70–99)

## 2012-09-14 MED ORDER — LABETALOL HCL 200 MG PO TABS
200.0000 mg | ORAL_TABLET | Freq: Two times a day (BID) | ORAL | Status: DC
  Administered 2012-09-14 – 2012-09-16 (×5): 200 mg via ORAL
  Filled 2012-09-14 (×8): qty 1

## 2012-09-14 MED ORDER — HYDROCODONE-ACETAMINOPHEN 5-325 MG PO TABS
1.0000 | ORAL_TABLET | Freq: Three times a day (TID) | ORAL | Status: DC | PRN
  Administered 2012-09-14: 1 via ORAL

## 2012-09-14 MED ORDER — INSULIN ASPART 100 UNIT/ML ~~LOC~~ SOLN
0.0000 [IU] | Freq: Three times a day (TID) | SUBCUTANEOUS | Status: DC
  Administered 2012-09-14: 5 [IU] via SUBCUTANEOUS
  Administered 2012-09-14 – 2012-09-15 (×2): 2 [IU] via SUBCUTANEOUS
  Administered 2012-09-15 – 2012-09-16 (×2): 5 [IU] via SUBCUTANEOUS
  Administered 2012-09-16 – 2012-09-17 (×4): 2 [IU] via SUBCUTANEOUS
  Administered 2012-09-17: 3 [IU] via SUBCUTANEOUS
  Administered 2012-09-18: 5 [IU] via SUBCUTANEOUS
  Administered 2012-09-18: 1 [IU] via SUBCUTANEOUS
  Administered 2012-09-19: 2 [IU] via SUBCUTANEOUS
  Administered 2012-09-19: 1 [IU] via SUBCUTANEOUS
  Administered 2012-09-19 – 2012-09-20 (×2): 2 [IU] via SUBCUTANEOUS
  Administered 2012-09-20: 1 [IU] via SUBCUTANEOUS
  Administered 2012-09-21: 2 [IU] via SUBCUTANEOUS
  Administered 2012-09-21: 1 [IU] via SUBCUTANEOUS
  Administered 2012-09-22: 2 [IU] via SUBCUTANEOUS
  Administered 2012-09-22: 1 [IU] via SUBCUTANEOUS
  Administered 2012-09-22 – 2012-09-23 (×3): 2 [IU] via SUBCUTANEOUS
  Administered 2012-09-23: 1 [IU] via SUBCUTANEOUS

## 2012-09-14 MED ORDER — WARFARIN SODIUM 5 MG PO TABS
5.0000 mg | ORAL_TABLET | Freq: Once | ORAL | Status: AC
  Administered 2012-09-14: 5 mg via ORAL
  Filled 2012-09-14: qty 1

## 2012-09-14 MED ORDER — TRAMADOL HCL 50 MG PO TABS
50.0000 mg | ORAL_TABLET | Freq: Three times a day (TID) | ORAL | Status: DC | PRN
  Administered 2012-09-14 – 2012-09-23 (×19): 50 mg via ORAL
  Filled 2012-09-14 (×20): qty 1

## 2012-09-14 MED ORDER — SODIUM CHLORIDE 0.9 % IV SOLN
300.0000 mL | Freq: Once | INTRAVENOUS | Status: AC
  Administered 2012-09-14: 300 mL via INTRAVENOUS

## 2012-09-14 NOTE — Progress Notes (Signed)
I have seen and examined this patient and agree with plan Mr. Mckeehan is making progress in Rehab.  The L UA AVF was used yesterday (one needle) for dialysis access without problems.  Will use again in dialysis tomorrow.  Phos should come down with PhosLo.Marina Gravel F,MD 09/14/2012 3:00 PM

## 2012-09-14 NOTE — Progress Notes (Signed)
[facility] Progress Note  Subjective:   Sleeping soundly; hasn't eaten lunch yet. Objective Filed Vitals:   09/13/12 1943 09/13/12 1959 09/13/12 2228 09/14/12 0542  BP: 145/77 128/83 132/83 138/89  Pulse: 91 86  80  Temp: 97.9 F (36.6 C) 99.2 F (37.3 C)  98.6 F (37 C)  TempSrc: Oral Oral Oral Oral  Resp: 16 17  18   Weight: 96.1 kg (211 lb 13.8 oz) 97.1 kg (214 lb 1.1 oz)    SpO2: 98% 97%  97%   Physical Exam General: diffcult to ruse Heart: RRR Lungs: no wheezes or rales Abdomen:soft Extremities: right no LE edema/compression stocking; left dressing intact Dialysis Access: right I J and maturing left uper AVF  Dialysis Orders: Center: [place] on TTS.  EDW 99 kg HD Bath 2K/2.25Ca Time 4 hrs Heparin 5000 U. Access Right IJ catheter, also L BVT (placed 06/14/12) BFR 400 DFR 800 Hectorol 6 mcg IV/HD Epogen 0 Units IV/HD Venofer 50 mg on Thurs.   Assessment/Plan: 1.  Displaced left tibial fx - s/p ORIF 2/14 - starting rehab 2. ESRD, hd TTS-- BVT in L upper arm is 46 mo old and maturing very slowly. Dr. Arbie Cookey assessed 2/17 - feels ready to access - AVF at 250 yesterday - not sure why DFR not at 800; continue to use. 3. HTN/volume- stable on BB/norvasc, UF 3 liters yesterday to post weight of 97.  4. Anemia - Hgb 9.4 - dropped post surgery, no outpatient Epogen- resume low dose, Venofer 50 mg q wk - check iron studies. 5. Metabolic bone disease -  P 9.4; Hectorol 6 mcg, Phoslo 2 with meals (resumed 2/18)--. 6. Nutrition - Alb 3.3, changed to high protein  renal diet.- add multivit 7. DM - on insulin per primary. 8. Hx CVA - in 07/2009. 9. Hx nephrolithiasis 10. Short term coumadin for VTE prophylaxis  Sheffield Slider, PA-C [facility] Beeper [phone] 09/14/2012,1:08 PM  LOS: 2 days    Additional Objective Labs: Basic Metabolic Panel:  Recent Labs Lab 09/08/12 0547  09/10/12 0630 09/11/12 0526 09/13/12 1615  NA 135  < > 134* 132* 130*   K 4.5  < > 6.0* 4.7 5.1  CL 95*  < > 94* 91* 87*  CO2 22  < > 21 22 23   GLUCOSE 158*  < > 224* 191* 166*  BUN 64*  < > 57* 46* 82*  CREATININE 9.97*  < > 10.18* 8.03* 12.63*  CALCIUM 9.4  < > 9.2 9.3 9.1  PHOS 6.6*  --   --   --  9.4*  < > = values in this interval not displayed. Lab Results  Component Value Date   INR 2.75* 09/14/2012   INR 3.25* 09/13/2012   INR 1.77* 09/12/2012     Recent Labs Lab 09/08/12 0547 09/13/12 1615  AST 26  --   ALT 17  --   ALKPHOS 86  --   BILITOT 0.2*  --   PROT 7.6  --   ALBUMIN 3.3* 2.9*  CBC:  Recent Labs Lab 09/07/12 2244  09/09/12 0710 09/09/12 1605 09/10/12 0630 09/11/12 0526 09/12/12 0540 09/13/12 1615  WBC 10.7*  < > 6.0 6.7 7.9 11.6* 8.6 7.0  NEUTROABS 8.3*  --  3.4  --   --   --   --   --   HGB 13.2  < > 11.6* 11.7* 11.4* 10.5* 9.7* 9.4*  HCT 39.2  < > 35.8* 35.6* 34.6* 31.1* 29.3* 27.4*  MCV 83.9  < > 84.4 85.4 84.4 83.2 82.8 81.5  PLT 283  < > 222 226 236 223 245 293  < > = values in this interval not displayed. CBG:  Recent Labs Lab 09/13/12 2110 09/14/12 0118 09/14/12 0402 09/14/12 0716 09/14/12 1114  GLUCAP 213* 181* 176* 187* 278*  Medications:   . amLODipine  10 mg Oral Daily  . brimonidine  1 drop Both Eyes BID  . brinzolamide  1 drop Both Eyes BID  . calcium acetate  1,334 mg Oral TID WC  . darbepoetin (ARANESP) injection - DIALYSIS  12.5 mcg Intravenous Q Tue-HD  . docusate sodium  100 mg Oral BID  . doxercalciferol  6 mcg Intravenous Q T,Th,Sa-HD  . [START ON 09/15/2012] ferric gluconate (FERRLECIT/NULECIT) IV  62.5 mg Intravenous Weekly  . insulin aspart  0-9 Units Subcutaneous TID WC  . labetalol  300 mg Oral BID  . pantoprazole  40 mg Oral Daily  . patient's guide to using coumadin book   Does not apply Once  . polyethylene glycol  68 g Oral Daily  . Travoprost (BAK Free)  1 drop Both Eyes QHS  . warfarin  5 mg Oral ONCE-1800  . Warfarin - Pharmacist Dosing Inpatient   Does not apply [phone]

## 2012-09-14 NOTE — Progress Notes (Signed)
Occupational Therapy Session Notes  Patient Details  Name: Oscar Castillo MRN: [id number] Date of Birth: 02/02/1951  Today's Date: 09/14/2012  Short Term Goals: Week 1:  OT Short Term Goal 1 (Week 1): Pt will perform LB bathing sit to stand with mod assist. OT Short Term Goal 2 (Week 1): Pt will perform LB dressing sit to stand with mod assist. OT Short Term Goal 3 (Week 1): Pt will perform stand pivot or scoot pivot transfers to bedside toilet with mod assist. OT Short Term Goal 4 (Week 1): Pt will demonstrate anticipatory awareness by stating that he needs assistance with all of his transfers and sit to stand when questioned by therapist. OT Short Term Goal 5 (Week 1): Pt will transfer from supine to sit EOB with no more than min assist in preparation for selfcare tasks.    Skilled Therapeutic Interventions/Progress Updates:   Session #1 [phone] - 55 Minutes Individual Therapy Patient with 7/10 complaints of pain in LLE; RN aware and gave pain medication Patient found supine in bed. Patient required max verbal cues and moderate assistance to move bilateral legs in order to perform supine -> sit. Patient then sat on edge of bed and attempted eating breakfast. Patient sat edge of bed ~10 minutes and would not start eating even after total verbal cues. Therapist suggested patient transfer into w/c. With moderate assistance and recommendation of counting to #3 patient transferred edge of bed -> w/c. Patient then sat in w/c and with total verbal cues performed self-feeding. RN also present for medication administration. Patient oriented to person, place, and time with questioning cues. Patient then sat at sink for grooming task of brushing teeth with total verbal cues to complete task. At end of session left patient seated beside bed in w/c with quick release belt donned and call bell & phone within reach. Patient able to verbally state how to use the call bell if he needed anything.  Session  #2 1120-1200 - 40 Minutes Individual Therapy No complaints of pain Patient found seated in w/c with quick release belt donned. Patient willing to participate in therapy. Therapist propelled patient -> sink side for UB bathing & dressing. Patient required total verbal cues in order to perform tasks. After sink side UB ADL therapist propelled patient into bathroom for toilet transfer, patient stated he did not need to use bathroom. Patient transferred on/off drop arm BSC seated over toilet seat with moderate assistance using grab bars prn. Patient then requested to get back to bed at end of session. With moderate assistance patient performed a squat pivot transfer onto edge of bed and performed sit->supine with moderate assistance. Left patient supine in bed with LLE elevated and bed alarm set, call bell & phone left within reach.   Precautions:  Precautions Precautions: Fall Restrictions Weight Bearing Restrictions: Yes LLE Weight Bearing: Non weight bearing  See FIM for current functional status  [name] 09/14/2012, 7:41 AM

## 2012-09-14 NOTE — Progress Notes (Signed)
ANTICOAGULATION CONSULT NOTE - Follow Up Consult  Pharmacy Consult for coumadin Indication: VTE prophylaxis  No Known Allergies  Patient Measurements: Weight: 214 lb 1.1 oz (97.1 kg) Heparin Dosing Weight:   Vital Signs: Temp: 98.6 F (37 C) (02/19 0542) Temp src: Oral (02/19 0542) BP: 138/89 mmHg (02/19 0542) Pulse Rate: 80 (02/19 0542)  Labs:  Recent Labs  09/12/12 0540 09/13/12 0830 09/13/12 1615 09/14/12 0655  HGB 9.7*  --  9.4*  --   HCT 29.3*  --  27.4*  --   PLT 245  --  293  --   LABPROT 20.0* 31.4*  --  27.7*  INR 1.77* 3.25*  --  2.75*  CREATININE  --   --  12.63*  --     The CrCl is unknown because both a height and weight (above a minimum accepted value) are required for this calculation.   Medications:  Scheduled:  . amLODipine  10 mg Oral Daily  . brimonidine  1 drop Both Eyes BID  . brinzolamide  1 drop Both Eyes BID  . calcium acetate  1,334 mg Oral TID WC  . [COMPLETED] darbepoetin      . darbepoetin (ARANESP) injection - DIALYSIS  12.5 mcg Intravenous Q Tue-HD  . docusate sodium  100 mg Oral BID  . doxercalciferol  6 mcg Intravenous Q T,Th,Sa-HD  . [START ON 09/15/2012] ferric gluconate (FERRLECIT/NULECIT) IV  62.5 mg Intravenous Weekly  . insulin aspart  0-5 Units Subcutaneous QHS  . labetalol  300 mg Oral BID  . pantoprazole  40 mg Oral Daily  . patient's guide to using coumadin book   Does not apply Once  . polyethylene glycol  68 g Oral Daily  . Travoprost (BAK Free)  1 drop Both Eyes QHS  . Warfarin - Pharmacist Dosing Inpatient   Does not apply q1800  . [DISCONTINUED] calcium acetate  2,001 mg Oral TID WC  . [DISCONTINUED] heparin subcutaneous  5,000 Units Subcutaneous Q8H   Infusions:    Assessment: 62 yo male s/p ortho surgery is currently on therapeutic coumadin after being supratherapeutic after a 7.5mg  and 10mg  coumadin dose.  INR today 2.75.   Goal of Therapy:  INR 2-3    Plan:  1) Coumadin 5mg  po x1 2) INR in am.  Aser Nylund,  Tsz-Yin 09/14/2012,8:30 AM

## 2012-09-14 NOTE — Progress Notes (Signed)
Physical Therapy Session Note  Patient Details  Name: Oscar Castillo MRN: [id number] Date of Birth: 1951/05/18  Today's Date: 09/14/2012 Time: 4098-1191 Time Calculation (min): 30 min  Short Term Goals: Week 1:  PT Short Term Goal 1 (Week 1): Pt will be able to transfer with min A squat pivot PT Short Term Goal 2 (Week 1): Pt will be able to to sit to stand with min A PT Short Term Goal 3 (Week 1): Pt will be able to gait with RW x 10' with mod A PT Short Term Goal 4 (Week 1): Pt will be able to demonstrate dynamic standing balance during functional task with min A  Skilled Therapeutic Interventions/Progress Updates:    Pt up in w/c already; presents with head down, minimal verbal communication, and very flat affect. W/c mobility in hallway with mod A and max verbal cues for technique and hand placement and then refused to continue. Therapist pushed him the rest of the way to the gym and set up in front of mat to refer transfer techniques. Pt continues to just sit and stare at the therapist; asked pt if he was in pain, fatigued, would learn better by writing things down, etc and pt just continued to stare. Gave pt ample time to attempt to work on task and then asked pt if he wanted to just return to his room. Pt said yes; explained to pt that if he refused to work with therapy he will not be able to stay on rehab, will not get strong enough to be able to go home, and it will need to be documented in his chart that he refused - pt verbalized understanding and stated he still wanted to go back to his room. Brought pt back to room; notified RN and social worker of pt's refusal to participate and missed 30 minutes.  Therapy Documentation Precautions:  Precautions Precautions: Fall Restrictions Weight Bearing Restrictions: Yes LLE Weight Bearing: Non weight bearing General: Amount of Missed PT Time (min): 30 Minutes Missed Time Reason: Patient unwilling/refused to participate without medical  reason  Pain:  Reports 8/10 pain in LLE - premedicated  See FIM for current functional status  Therapy/Group: Individual Therapy  Karolee Stamps [facility] 09/14/2012, 9:29 AM

## 2012-09-14 NOTE — Plan of Care (Signed)
Problem: RH BOWEL ELIMINATION Goal: RH STG MANAGE BOWEL WITH ASSISTANCE STG Manage Bowel with min Assistance.  Outcome: Not Progressing LBM 09-08-12, Miralax x4 am Goal: RH STG MANAGE BOWEL W/MEDICATION W/ASSISTANCE STG Manage Bowel with Medication with min Assistance.  Outcome: Not Progressing LBM 09-08-12, Miralax x4 am  Problem: RH SAFETY Goal: RH STG ADHERE TO SAFETY PRECAUTIONS W/ASSISTANCE/DEVICE STG Adhere to Safety Precautions With supervision Assistance/Device.  Outcome: Not Progressing Patient tried to get out of bed without assist. Goal: RH STG DECREASED RISK OF FALL WITH ASSISTANCE STG Decreased Risk of Fall With min Assistance.  Outcome: Not Progressing Patient tried to get out of bed without assist.

## 2012-09-14 NOTE — Progress Notes (Signed)
Received a call from RN regarding pt't lethargy and low BP ~90/60.  Has been on usual labetolol 300 bid and norvasc 10.  Will give bolus 300 cc NS and decrease labetolol to 200 bid with parameters.  Bard Herbert PA-C 09/15/2011 3:14 pm

## 2012-09-14 NOTE — Progress Notes (Signed)
Patient with decreased activity. Refused physical therapy. Refused lunch. Lunch dose of Phoslo not given. Patient alert to time, person, and place. Flat affect. PA, Dan aware. To monitor patient. Sherlyn Lees RN  .

## 2012-09-14 NOTE — Progress Notes (Signed)
Physical Therapy Note  Patient Details  Name: Oscar Castillo MRN: [id number] Date of Birth: [birth year] Today's Date: 09/14/2012  Patient missed 30 minutes of skilled physical therapy this PM secondary to refusal to participate. 15 minutes spent encouraging patient and using max verbal and tactile cues and patient maintains eyes closed. Raised head of bed, performed sternal rub and patient maintains eyes closed and does not verbalize. Notified patient that it would have to be documented that he refused therapy and no response. RN notified and stated that patient has been like this all day and did not eat lunch either. Will follow up as able.  Zella Richer Cleaster Shiffer S. Ambra Haverstick, PT, DPT  09/14/2012, 2:44 PM

## 2012-09-14 NOTE — Progress Notes (Signed)
Subjective/Complaints: Restless at night. Tends to be more confused at night per wife. Tries to get out of bed without assistance. Pain an issues at times also. A 12 point review of systems has been performed and if not noted above is otherwise negative.   Objective: Vital Signs: Blood pressure 138/89, pulse 80, temperature 98.6 F (37 C), temperature source Oral, resp. rate 18, weight 97.1 kg (214 lb 1.1 oz), SpO2 97.00%. No results found.  Recent Labs  09/12/12 0540 09/13/12 1615  WBC 8.6 7.0  HGB 9.7* 9.4*  HCT 29.3* 27.4*  PLT 245 293    Recent Labs  09/13/12 1615  NA 130*  K 5.1  CL 87*  GLUCOSE 166*  BUN 82*  CREATININE 12.63*  CALCIUM 9.1   CBG (last 3)   Recent Labs  09/14/12 0118 09/14/12 0402 09/14/12 0716  GLUCAP 181* 176* 187*    Wt Readings from Last 3 Encounters:  09/13/12 97.1 kg (214 lb 1.1 oz)  09/12/12 101.3 kg (223 lb 5.2 oz)  09/12/12 101.3 kg (223 lb 5.2 oz)    Physical Exam:  Constitutional: He appears well-developed and well-nourished. No distress.  HENT:  Head: Normocephalic and atraumatic.  Right Ear: External ear normal.  Left Ear: External ear normal.  Nose: Nose normal.  Mouth/Throat: Oropharynx is clear and moist.  Eyes: Conjunctivae and EOM are normal. Right eye exhibits no discharge. Left eye exhibits no discharge. No scleral icterus.  Pupils round and reactive to light  Neck: Neck supple. No JVD present. No tracheal deviation present. No thyromegaly present.  Cardiovascular: Normal rate and regular rhythm. Exam reveals no gallop and no friction rub.  No murmur heard.  Pulmonary/Chest: Effort normal and breath sounds normal. No respiratory distress. He has no wheezes. He has no rales. He exhibits no tenderness.  Abdominal: Soft. Bowel sounds are normal. He exhibits no distension. There is no tenderness. There is no rebound and no guarding.  Musculoskeletal:  Left leg surgery sites intact. Limb appropriately tender.   Lymphadenopathy:  He has no cervical adenopathy.  Neurological: He is alert. He displays normal reflexes. No cranial nerve deficit. Coordination normal.  Patient was able to provide his name, age and place.  He followed simple commands. UE are 5/5. He is unable to lift leg off chair rest. Moves toes of left foot. RLE is grossly 4- to 4/5. Stocking glove sensory loss in the feet  Skin: He is not diaphoretic.  Left Leg incisions are dressed. onchomycotic toe nails Psych: affect very flat.    Assessment/Plan: 1. Functional deficits secondary to right distal tibia shaft fx, right distal fibula fx which require 3+ hours per day of interdisciplinary therapy in a comprehensive inpatient rehab setting. Physiatrist is providing close team supervision and 24 hour management of active medical problems listed below. Physiatrist and rehab team continue to assess barriers to discharge/monitor patient progress toward functional and medical goals. FIM: FIM - Bathing Bathing: 0: Activity did not occur  FIM - Upper Body Dressing/Undressing Upper body dressing/undressing: 0: Activity did not occur FIM - Lower Body Dressing/Undressing Lower body dressing/undressing: 0: Activity did not occur        FIM - Bed/Chair Transfer Bed/Chair Transfer Assistive Devices: Manufacturing systems engineer Transfer: 3: Supine > Sit: Mod A (lifting assist/Pt. 50-74%/lift 2 legs;4: Sit > Supine: Min A (steadying pt. > 75%/lift 1 leg);2: Bed > Chair or W/C: Max A (lift and lower assist);2: Chair or W/C > Bed: Max A (lift and lower assist)  FIM -  Locomotion: Wheelchair Locomotion: Wheelchair: 1: Travels less than 50 ft with moderate assistance (Pt: 50 - 74%) FIM - Locomotion: Ambulation Locomotion: Ambulation Assistive Devices: Walker - Rolling Locomotion: Ambulation: 1: Travels less than 50 ft with moderate assistance (Pt: 50 - 74%)  Comprehension Comprehension Mode: Auditory Comprehension: 3-Understands basic 50 - 74% of the  time/requires cueing 25 - 50%  of the time  Expression Expression Mode: Verbal Expression: 2-Expresses basic 25 - 49% of the time/requires cueing 50 - 75% of the time. Uses single words/gestures.  Social Interaction Social Interaction: 2-Interacts appropriately 25 - 49% of time - Needs frequent redirection.  Problem Solving Problem Solving: 2-Solves basic 25 - 49% of the time - needs direction more than half the time to initiate, plan or complete simple activities  Memory Memory: 3-Recognizes or recalls 50 - 74% of the time/requires cueing 25 - 49% of the time  Medical Problem List and Plan:  1. Distal shaft tibia fracture, distal fibula fracture. Status post intramedullary nail, ORIF 09/09/2012  2. DVT Prophylaxis/Anticoagulation: Coumadin for DVT prophylaxis. Continue subcutaneous heparin until INR greater than 2.00. Monitor for any signs of bleeding. Doppler pending today 3. Pain Management: Hydrocodone as needed.--decreased frequency 4. Neuropsych: This patient is capable of making decisions on his/her own behalf.  -he is not at baseline cognitively. My suspicion is that there were some baseline deficits increased by this hospitalization, meds, etc. -limit narcotics as possible. Spoke with wife in this regard 5. Diabetes mellitus with peripheral neuropathy. Hemoglobin A1c 7.8. Currently with sliding scale insulin. Check blood sugars a.c. and at bedtime. Adjust as we see a trend 6. End-stage renal disease. Continue hemodialysis as directed per original services.  7. Hypertension. Norvasc 10 mg daily, labetalol 300 mg twice a day. Monitor with increased activity  8. Chronic anemia. Continue Nulecit weekly with dialysis. Followup CBC with dialysis. Latest hemoglobin 9.7  9. Diastolic congestive heart failure. Following I's and O's, weights LOS (Days) 2 A FACE TO FACE EVALUATION WAS PERFORMED  Oscar Castillo T 09/14/2012 8:01 AM

## 2012-09-15 ENCOUNTER — Inpatient Hospital Stay (HOSPITAL_COMMUNITY): Payer: Medicare Other | Admitting: Occupational Therapy

## 2012-09-15 ENCOUNTER — Inpatient Hospital Stay (HOSPITAL_COMMUNITY): Payer: Medicare Other

## 2012-09-15 ENCOUNTER — Inpatient Hospital Stay (HOSPITAL_COMMUNITY): Payer: Medicare Other | Admitting: Physical Therapy

## 2012-09-15 ENCOUNTER — Inpatient Hospital Stay (HOSPITAL_COMMUNITY): Payer: Medicare Other | Admitting: *Deleted

## 2012-09-15 LAB — IRON AND TIBC
Iron: 21 ug/dL — ABNORMAL LOW (ref 42–135)
Saturation Ratios: 12 % — ABNORMAL LOW (ref 20–55)
TIBC: 182 ug/dL — ABNORMAL LOW (ref 215–435)
UIBC: 161 ug/dL (ref 125–400)

## 2012-09-15 LAB — GLUCOSE, CAPILLARY
Glucose-Capillary: 161 mg/dL — ABNORMAL HIGH (ref 70–99)
Glucose-Capillary: 197 mg/dL — ABNORMAL HIGH (ref 70–99)

## 2012-09-15 LAB — RENAL FUNCTION PANEL
Albumin: 2.8 g/dL — ABNORMAL LOW (ref 3.5–5.2)
BUN: 69 mg/dL — ABNORMAL HIGH (ref 6–23)
CO2: 23 mEq/L (ref 19–32)
Calcium: 9.8 mg/dL (ref 8.4–10.5)
Chloride: 89 mEq/L — ABNORMAL LOW (ref 96–112)
Creatinine, Ser: 11.82 mg/dL — ABNORMAL HIGH (ref 0.50–1.35)
GFR calc Af Amer: 5 mL/min — ABNORMAL LOW (ref >90)
GFR calc non Af Amer: 4 mL/min — ABNORMAL LOW (ref >90)
Glucose, Bld: 200 mg/dL — ABNORMAL HIGH (ref 70–99)
Phosphorus: 8 mg/dL — ABNORMAL HIGH (ref 2.3–4.6)
Potassium: 5 mEq/L (ref 3.5–5.1)
Sodium: 131 mEq/L — ABNORMAL LOW (ref 135–145)

## 2012-09-15 LAB — CBC
HCT: 28.4 % — ABNORMAL LOW (ref 39.0–52.0)
Hemoglobin: 9.6 g/dL — ABNORMAL LOW (ref 13.0–17.0)
MCH: 27.5 pg (ref 26.0–34.0)
MCHC: 33.8 g/dL (ref 30.0–36.0)
MCV: 81.4 fL (ref 78.0–100.0)
Platelets: 350 10*3/uL (ref 150–400)
RBC: 3.49 MIL/uL — ABNORMAL LOW (ref 4.22–5.81)
RDW: 14.2 % (ref 11.5–15.5)
WBC: 8.8 10*3/uL (ref 4.0–10.5)

## 2012-09-15 LAB — FERRITIN: Ferritin: 306 ng/mL (ref 22–322)

## 2012-09-15 MED ORDER — PENTAFLUOROPROP-TETRAFLUOROETH EX AERO: 1.0000 "application " | INHALATION_SPRAY | CUTANEOUS | Status: DC | PRN

## 2012-09-15 MED ORDER — NEPRO/CARBSTEADY PO LIQD
237.0000 mL | ORAL | Status: DC | PRN
  Filled 2012-09-15: qty 237

## 2012-09-15 MED ORDER — HEPARIN SODIUM (PORCINE) 1000 UNIT/ML DIALYSIS
1000.0000 [IU] | Freq: Once | INTRAMUSCULAR | Status: AC
  Administered 2012-09-15: 1000 [IU] via INTRAVENOUS_CENTRAL

## 2012-09-15 MED ORDER — SODIUM CHLORIDE 0.9 % IV SOLN: 100.0000 mL | INTRAVENOUS | Status: DC | PRN

## 2012-09-15 MED ORDER — HEPARIN SODIUM (PORCINE) 1000 UNIT/ML DIALYSIS: 1000.0000 [IU] | INTRAMUSCULAR | Status: DC | PRN

## 2012-09-15 MED ORDER — LIDOCAINE HCL (PF) 1 % IJ SOLN: 5.0000 mL | INTRAMUSCULAR | Status: DC | PRN

## 2012-09-15 MED ORDER — LIDOCAINE-PRILOCAINE 2.5-2.5 % EX CREA
1.0000 "application " | TOPICAL_CREAM | CUTANEOUS | Status: DC | PRN
  Filled 2012-09-15: qty 5

## 2012-09-15 MED ORDER — ALTEPLASE 2 MG IJ SOLR
2.0000 mg | Freq: Once | INTRAMUSCULAR | Status: DC | PRN
  Filled 2012-09-15: qty 2

## 2012-09-15 NOTE — Progress Notes (Signed)
[facility] Progress Note  Subjective:   No c/o Objective Filed Vitals:   09/14/12 1839 09/14/12 2022 09/15/12 0500 09/15/12 0756  BP: 151/84 138/84 127/69 132/80  Pulse: 77 82 78 79  Temp: 98.7 F (37.1 C)  98.6 F (37 C)   TempSrc: Oral  Oral   Resp: 18  19   Weight:   97.8 kg (215 lb 9.8 oz)   SpO2: 97%  99%    Physical Exam General: sitting in w/c; affect flat Heart: RRR Lungs:  Few crackles at bases Abdomen:soft Extremities: right no LE edema/compression stocking; left dressing intact Dialysis Access: right I J and maturing left uper AVF  Dialysis Orders: Center: [place] on TTS.  EDW 99 kg HD Bath 2K/2.25Ca Time 4 hrs Heparin 5000 U. Access Right IJ catheter, also L BVT (placed 06/14/12) BFR 400 DFR 800 Hectorol 6 mcg IV/HD Epogen 0 Units IV/HD Venofer 50 mg on Thurs.   Assessment/Plan: 1.  Displaced left tibial fx - s/p ORIF 2/14 - starting rehab 2. ESRD, hd TTS-- BVT in L upper arm is 73 mo old and maturing very slowly First use 2/18 at 250  - not sure why DFR not at 800; continue to use 3. HTN/volume- Had hypotensive episode yesterday afternoon.  Resolved with bolus of saline and labetolol was reduced to 200 bid. Also on 10 norvasc. Below prior EDW 97.1 post HD 2/18 and 97.8 this am. Goal 1-2 liters keep systolic BP > 120 4. Anemia - Hgb 9.4 - dropped post surgery, no outpatient Epogen- resume low dose, Venofer 50 mg q wk - check iron studies pre HD today 5. Metabolic bone disease -  P 9.4; Hectorol 6 mcg, Phoslo 2 with meals (resumed 2/18)--.recheck pre HD today 6. Nutrition - Alb 3.3, changed to high protein  renal diet.- add multivit 7. DM - on insulin per primary. 8. Hx CVA - in 07/2009. 9. Hx nephrolithiasis 10. Short term coumadin for VTE prophylaxis - INR high - per pharmacy  Sheffield Slider, PA-C [facility] Beeper [phone] 09/15/2012,10:34 AM  LOS: 3 days  Mr. Erway is currently in PT gym--doing well with rehab.  Dialysis  scheduled for later today.  Pharmacy to adjust coumadin (PT 35 sec, INR 3.7).  Agree with plans as outlined above.     Additional Objective Labs: Basic Metabolic Panel: Lab Results  Component Value Date   INR 3.73* 09/15/2012   INR 2.75* 09/14/2012   INR 3.25* 09/13/2012    Recent Labs Lab 09/10/12 0630 09/11/12 0526 09/13/12 1615  NA 134* 132* 130*  K 6.0* 4.7 5.1  CL 94* 91* 87*  CO2 21 22 23   GLUCOSE 224* 191* 166*  BUN 57* 46* 82*  CREATININE 10.18* 8.03* 12.63*  CALCIUM 9.2 9.3 9.1  PHOS  --   --  9.4*   Lab Results  Component Value Date   INR 3.73* 09/15/2012   INR 2.75* 09/14/2012   INR 3.25* 09/13/2012     Recent Labs Lab 09/13/12 1615  ALBUMIN 2.9*  CBC:  Recent Labs Lab 09/09/12 0710 09/09/12 1605 09/10/12 0630 09/11/12 0526 09/12/12 0540 09/13/12 1615  WBC 6.0 6.7 7.9 11.6* 8.6 7.0  NEUTROABS 3.4  --   --   --   --   --   HGB 11.6* 11.7* 11.4* 10.5* 9.7* 9.4*  HCT 35.8* 35.6* 34.6* 31.1* 29.3* 27.4*  MCV 84.4 85.4 84.4 83.2 82.8 81.5  PLT 222 226 236 223 245 293  CBG:  Recent Labs Lab 09/14/12 0716 09/14/12 1114 09/14/12 1646 09/14/12 2122 09/15/12 0715  GLUCAP 187* 278* 181* 197* 161*  Medications:   . amLODipine  10 mg Oral Daily  . brimonidine  1 drop Both Eyes BID  . brinzolamide  1 drop Both Eyes BID  . calcium acetate  1,334 mg Oral TID WC  . darbepoetin (ARANESP) injection - DIALYSIS  12.5 mcg Intravenous Q Tue-HD  . docusate sodium  100 mg Oral BID  . doxercalciferol  6 mcg Intravenous Q T,Th,Sa-HD  . ferric gluconate (FERRLECIT/NULECIT) IV  62.5 mg Intravenous Weekly  . insulin aspart  0-9 Units Subcutaneous TID WC  . labetalol  200 mg Oral BID  . pantoprazole  40 mg Oral Daily  . patient's guide to using coumadin book   Does not apply Once  . polyethylene glycol  68 g Oral Daily  . Travoprost (BAK Free)  1 drop Both Eyes QHS  . Warfarin - Pharmacist Dosing Inpatient   Does not apply [phone]

## 2012-09-15 NOTE — Procedures (Signed)
Patient was seen on dialysis and the procedure was supervised.  BFR 150 (just starting)   Via AVF BP is  111/67.   Patient appears to be tolerating treatment well  Oscar Castillo A 09/15/2012

## 2012-09-15 NOTE — Progress Notes (Signed)
Physical Therapy Session Note  Patient Details  Name: Oscar Castillo MRN: [id number] Date of Birth: 10-26-1950  Today's Date: 09/15/2012 Time: 1100-1200 Time Calculation (min): 60 min  Short Term Goals: Week 1:  PT Short Term Goal 1 (Week 1): Pt will be able to transfer with min A squat pivot PT Short Term Goal 2 (Week 1): Pt will be able to to sit to stand with min A PT Short Term Goal 3 (Week 1): Pt will be able to gait with RW x 10' with mod A PT Short Term Goal 4 (Week 1): Pt will be able to demonstrate dynamic standing balance during functional task with min A  Skilled Therapeutic Interventions/Progress Updates:    Patient received sitting in wheelchair. Patient with improved alertness and participation today as compared to yesterday. Patient instructed in wheelchair mobility x200' with min assist and requires increased time to complete. Attempted sit>stand in front of parallel bars in order for patient to use B UE to pull up and assist with standing. Patient unable to achieve standing after multiple attempts.   Patient performed squat pivot transfer wheelchair>mat with max assist to clear surface of chair and to maintain NWB status of L LE. Patient performed LE there ex sitting edge of mat to increase strength of L LE to assist with getting L LE in/out of bed. Patient performed 2 sets x10 reps L hip flexion and L knee extension (with emphasis on slow, controlled, eccentric lowering). Patient requires min assist for L LE management for sit<>supine. In supine, patient performed R single leg bridges 2 x10 reps.  Patient instructed in bed mobility: rolling L and R and requires min-mod assist for rolling secondary to decreased initiation, delayed processing, and poor sequencing. Patient requires max verbal and tactile cues for proper sequencing when rolling to L and R.  Throughout entire session, patient demonstrates decreased initiation (usually requiring repeated cues to begin task), delayed  processing, and difficulty with sequencing and coordination.  NWB status maintained throughout session, but patient requires repeated verbal and tactile cues to maintain.  Patient returned to room and left seated in wheelchair with seatbelt donned and all needs within reach.   Therapy Documentation Precautions:  Precautions Precautions: Fall Restrictions Weight Bearing Restrictions: Yes LLE Weight Bearing: Non weight bearing Pain: Pain Assessment Pain Assessment: No/denies pain Pain Score: 0-No pain Locomotion : Wheelchair Mobility Distance: 200    See FIM for current functional status  Therapy/Group: Individual Therapy  Chipper Herb. Reyna Lorenzi, PT, DPT  09/15/2012, 3:57 PM

## 2012-09-15 NOTE — Progress Notes (Signed)
Inpatient Rehabilitation Center Individual Statement of Services  Patient Name:  MASEN LUALLEN  Date:  09/15/2012  Welcome to the Inpatient Rehabilitation Center.  Our goal is to provide you with an individualized program based on your diagnosis and situation, designed to meet your specific needs.  With this comprehensive rehabilitation program, you will be expected to participate in at least 3 hours of rehabilitation therapies Monday-Friday, with modified therapy programming on the weekends.  Your rehabilitation program will include the following services:  Physical Therapy (PT), Occupational Therapy (OT), 24 hour per day rehabilitation nursing, Therapeutic Recreaction (TR), Neuropsychology, Case Management ( Social Worker), Rehabilitation Medicine, Nutrition Services and Pharmacy Services  Weekly team conferences will be held on Tuesdays to discuss your progress.  Your  Social Worker will talk with you frequently to get your input and to update you on team discussions.  Team conferences with you and your family in attendance may also be held.  Expected length of stay: 7-10 days  Overall anticipated outcome: modified independent  Depending on your progress and recovery, your program may change.  Your  Social Worker will coordinate services and will keep you informed of any changes.  Your  Social Worker's name and contact numbers are listed  below.  The following services may also be recommended but are not provided by the Inpatient Rehabilitation Center:   Driving Evaluations  Home Health Rehabiltiation Services  Outpatient Rehabilitatation Servives    Arrangements will be made to provide these services after discharge if needed.  Arrangements include referral to agencies that provide these services.  Your insurance has been verified to be:  Medicare Your primary doctor is:  Dr. Oliver Barre  Pertinent information will be shared with your doctor and your insurance company.  Social  Worker:  [place], [place] [phone] or [phone]  Information discussed with and copy given to patient by: Amada Jupiter, 09/15/2012, 9:32 AM

## 2012-09-15 NOTE — Progress Notes (Signed)
Physical Therapy Session Note  Patient Details  Name: Oscar Castillo MRN: [id number] Date of Birth: 08-Jul-1951  Today's Date: 09/15/2012 Time: 6213-0865 Time Calculation (min): 55 min  Short Term Goals: Week 1:  PT Short Term Goal 1 (Week 1): Pt will be able to transfer with min A squat pivot PT Short Term Goal 2 (Week 1): Pt will be able to to sit to stand with min A PT Short Term Goal 3 (Week 1): Pt will be able to gait with RW x 10' with mod A PT Short Term Goal 4 (Week 1): Pt will be able to demonstrate dynamic standing balance during functional task with min A  Skilled Therapeutic Interventions/Progress Updates:   Pt already in therapy gym and agreeable to participate, though still very flat affect. Mod A squat pivot transfer to mat to begin therapy but noticed pt with incontinent BM leaking through pants (pt unaware, stating he just felt gas). Transferred back to w/c and returned to room with nurse tech to assist with clean up; pt able to roll with min to mod A. Due to soiled clothing, RN needed to change ACE wraps (missed about 5 minutes of time). Continued to work on bed mobility to put clean pants back on pt. Therex for functional strengthening with LLE including AAROM for hip abduction, seated marches, and LAQ (minimal lift against gravity). From EOB completed 3 sit to stands with RW with mod/max A and then stand pivot with RW into w/c. W/c mobility in pt room with mod verbal cues to turn chair around and back in next to bed for household mobility. Educated on w/c parts management, but pt reports difficulty seeing; left a note for pt's wife to bring in his glasses. Safety belt on and call bell in reach.    Therapy Documentation Precautions:  Precautions Precautions: Fall Restrictions Weight Bearing Restrictions: Yes LLE Weight Bearing: Non weight bearing  Pain: Reports pain in LLE; unrated and premedicated.     See FIM for current functional status  Therapy/Group: Individual  Therapy  Karolee Stamps [facility] 09/15/2012, 9:38 AM

## 2012-09-15 NOTE — Progress Notes (Signed)
Physical Therapy Note  Patient Details  Name: Oscar Castillo MRN: [id number] Date of Birth: 14-Jul-1951 Today's Date: 09/15/2012  Time: 1330-1355 25 minutes  No c/o pain.  Upon PT entry into room pt states he was just going to get in bed from his w/c.  PT asked pt if he needed assist and pt verbalized "No, I can do it myself"  After several failed attempts pt agreeable to have PT assist with w/c parts management, mod A to squat pivot with max cuing to maintain wt bearing status on L LE.  Pt required increased time but able to go sit to supine with supervision.  Pt then agreeable to supine therex performing UE AROM all planes for strength and activity tolerance.  Pt required max cuing for technique throughout exercise session.  Individual therapy   Byanca Kasper 09/15/2012, 1:56 PM

## 2012-09-15 NOTE — Progress Notes (Signed)
Patient complain of pain of left lower extremity, given Tylenol 650 mg at 2029 pm. At appropriately 2100 pm patient states medication was ineffective. Notified Dan A.,PA given order for Tramadol 50 mg every 8 hours PRN. Will continue to monitor patient.

## 2012-09-15 NOTE — Consult Note (Signed)
09/14/12  PSYCHOSOCIAL NOTE - CONFIDENTIAL   Inpatient Rehabilitation   Mr. Oscar Castillo was seen on the [facility] Rehabilitation Unit.  According to medical records, he was admitted to the rehab unit owing to functional deficits subsequent to a right distal tibia fracture and a right distal fibula fracture. A neuropsychology consult was ordered to assess his current cognitive and emotional status subsequent to his present medical status and to assist with treatment planning. Of note, it was reported that for no known apparent reason, Oscar Castillo has been non-compliant with therapy. It is unclear whether this is due to psychiatric reasons or other medical, behavioral or neurological components.   Upon entry into Oscar Castillo room, he was leaned over his bed rail with his eyes closed. After introducing myself, I realized that the patient was entirely disengaged in the conversation. He responded variably to questions posed to him, and most of the time he answered under his breath with responses such as "uh-huh" or "uh-uh." He would not respond to any open-ended questions. Two times during this appointment, he said "yes, sir" and one of the times it was an inappropriate response to the question posed. At that time, I inquired whether he felt confused or fatigued and he answered positive to the latter. I explained that I would come back at a different time.   My plan is to meet with Oscar Castillo together this Friday to better ascertain the etiology of the observed behaviors.   REFERRING DIAGNOSIS: Tibia/fibula fracture  FINAL DIAGNOSES:  Tibia/fibula fracture Cognitive disorder, NOS R/O Altered mental status R/O Depression  Total professional time including medical record review and interaction with patient equals 4 units (16109).   Debbe Mounts, Psy.D.  Clinical Neuropsychologist

## 2012-09-15 NOTE — Progress Notes (Signed)
Subjective/Complaints: Decreased responsiveness yesterday. Refusing therapy.  A 12 point review of systems has been performed and if not noted above is otherwise negative.   Objective: Vital Signs: Blood pressure 132/80, pulse 79, temperature 98.6 F (37 C), temperature source Oral, resp. rate 19, weight 97.8 kg (215 lb 9.8 oz), SpO2 99.00%. No results found.  Recent Labs  09/13/12 1615  WBC 7.0  HGB 9.4*  HCT 27.4*  PLT 293    Recent Labs  09/13/12 1615  NA 130*  K 5.1  CL 87*  GLUCOSE 166*  BUN 82*  CREATININE 12.63*  CALCIUM 9.1   CBG (last 3)   Recent Labs  09/14/12 1646 09/14/12 2122 09/15/12 0715  GLUCAP 181* 197* 161*    Wt Readings from Last 3 Encounters:  09/15/12 97.8 kg (215 lb 9.8 oz)  09/12/12 101.3 kg (223 lb 5.2 oz)  09/12/12 101.3 kg (223 lb 5.2 oz)    Physical Exam:  Constitutional: He appears well-developed and well-nourished. No distress.  HENT:  Head: Normocephalic and atraumatic.  Right Ear: External ear normal.  Left Ear: External ear normal.  Nose: Nose normal.  Mouth/Throat: Oropharynx is clear and moist.  Eyes: Conjunctivae and EOM are normal. Right eye exhibits no discharge. Left eye exhibits no discharge. No scleral icterus.  Pupils round and reactive to light  Neck: Neck supple. No JVD present. No tracheal deviation present. No thyromegaly present.  Cardiovascular: Normal rate and regular rhythm. Exam reveals no gallop and no friction rub.  No murmur heard.  Pulmonary/Chest: Effort normal and breath sounds normal. No respiratory distress. He has no wheezes. He has no rales. He exhibits no tenderness.  Abdominal: Soft. Bowel sounds are normal. He exhibits no distension. There is no tenderness. There is no rebound and no guarding.  Musculoskeletal:  Left leg surgery sites intact. Limb appropriately tender.  Lymphadenopathy:  He has no cervical adenopathy.  Neurological: He is alert when he chooses to be. He displays normal  reflexes. No cranial nerve deficit. Coordination normal.  Patient was able to provide his name, age and place.  He followed simple commands. UE are 5/5. He is unable to lift leg off chair rest. Moves toes of left foot. RLE is grossly 4- to 4/5. Stocking glove sensory loss in the feet  Skin: He is not diaphoretic.  Left Leg incisions are dressed. onchomycotic toe nails Psych: affect very flat.    Assessment/Plan: 1. Functional deficits secondary to right distal tibia shaft fx, right distal fibula fx which require 3+ hours per day of interdisciplinary therapy in a comprehensive inpatient rehab setting. Physiatrist is providing close team supervision and 24 hour management of active medical problems listed below. Physiatrist and rehab team continue to assess barriers to discharge/monitor patient progress toward functional and medical goals.  I spoke to the patient at length this morning in the presence of his wife. I told him that he will have to participate if he wants to get home and continue her on inpatient rehab. If not, we will pursue placement at SNF   FIM: FIM - Bathing Bathing Steps Patient Completed: Chest;Right Arm;Left Arm;Abdomen Bathing: 5: Supervision: Safety issues/verbal cues (for UB bathing, no LB bathing completed)  FIM - Upper Body Dressing/Undressing Upper body dressing/undressing steps patient completed: Thread/unthread right sleeve of pullover shirt/dresss;Thread/unthread left sleeve of pullover shirt/dress;Put head through opening of pull over shirt/dress;Pull shirt over trunk Upper body dressing/undressing: 5: Supervision: Safety issues/verbal cues FIM - Lower Body Dressing/Undressing Lower body dressing/undressing: 0: Activity  did not occur  FIM - Toileting Toileting: 0: Activity did not occur  FIM - Diplomatic Services operational officer Devices: Bedside commode;Elevated toilet seat;Grab bars Toilet Transfers: 3-To toilet/BSC: Mod A (lift or lower  assist);3-From toilet/BSC: Mod A (lift or lower assist)  FIM - Bed/Chair Transfer Bed/Chair Transfer Assistive Devices: Manufacturing systems engineer Transfer: 3: Supine > Sit: Mod A (lifting assist/Pt. 50-74%/lift 2 legs;3: Sit > Supine: Mod A (lifting assist/Pt. 50-74%/lift 2 legs);3: Bed > Chair or W/C: Mod A (lift or lower assist);3: Chair or W/C > Bed: Mod A (lift or lower assist)  FIM - Locomotion: Wheelchair Locomotion: Wheelchair: 2: Travels 50 - 149 ft with moderate assistance (Pt: 50 - 74%) FIM - Locomotion: Ambulation Locomotion: Ambulation Assistive Devices: Walker - Rolling Locomotion: Ambulation: 0: Activity did not occur  Comprehension Comprehension Mode: Auditory Comprehension: 3-Understands basic 50 - 74% of the time/requires cueing 25 - 50%  of the time  Expression Expression Mode: Verbal Expression: 2-Expresses basic 25 - 49% of the time/requires cueing 50 - 75% of the time. Uses single words/gestures.  Social Interaction Social Interaction: 1-Interacts appropriately less than 25% of the time. May be withdrawn or combative.  Problem Solving Problem Solving: 2-Solves basic 25 - 49% of the time - needs direction more than half the time to initiate, plan or complete simple activities  Memory Memory: 4-Recognizes or recalls 75 - 89% of the time/requires cueing 10 - 24% of the time  Medical Problem List and Plan:  1. Distal shaft tibia fracture, distal fibula fracture. Status post intramedullary nail, ORIF 09/09/2012  2. DVT Prophylaxis/Anticoagulation: Coumadin for DVT prophylaxis. Continue subcutaneous heparin until INR greater than 2.00. Monitor for any signs of bleeding. Doppler pending today 3. Pain Management: Hydrocodone as needed.--decreased frequency 4. Neuropsych: This patient is capable of making decisions on his/her own behalf.  -he is not at baseline cognitively per wife. ?med effect---dc'ed hydrocodone  -I suspect a large behavioral component here as well 5.  Diabetes mellitus with peripheral neuropathy. Hemoglobin A1c 7.8. Currently with sliding scale insulin. Check blood sugars a.c. and at bedtime. Sugars elevated but not substantially, will hold on standing adjustments for now 6. End-stage renal disease. Continue hemodialysis as directed per original services.  7. Hypertension. Norvasc 10 mg daily, labetalol 300 mg twice a day. Monitor with increased activity  8. Chronic anemia. Continue Nulecit weekly with dialysis. Followup CBC with dialysis. Latest hemoglobin 9.7  9. Diastolic congestive heart failure. Following I's and O's, weights. IVF bolus yesterday per renal LOS (Days) 3 A FACE TO FACE EVALUATION WAS PERFORMED  Song Garris T 09/15/2012 8:01 AM

## 2012-09-15 NOTE — Progress Notes (Signed)
Social Work  Social Work Assessment and Plan  Patient Details  Name: Oscar Castillo MRN: [id number] Date of Birth: 28-Oct-1950  Today's Date: 09/15/2012  Problem List:  Patient Active Problem List  Diagnosis  . Morbid obesity  . ERECTILE DYSFUNCTION  . DEPRESSION  . HYPERTENSION  . Acute combined systolic and diastolic heart failure, NYHA class 2-EF 45%  . GERD  . ABDOMINAL ULTRASOUND, ABNORMAL  . HEPATITIS C, HX OF  . CEREBROVASCULAR ACCIDENT, HX OF  . NEPHROLITHIASIS, HX OF  . CHOLELITHIASIS  . BENIGN PROSTATIC HYPERTROPHY  . Hyperlipidemia  . Bladder neck obstruction  . Anemia due to chronic illness  . LVH (left ventricular hypertrophy)-severe concentric  . Diabetes mellitus  . Left tibial fracture  . ESRD on hemodialysis  . HTN (hypertension)  . Tibia/fibula fracture   Past Medical History:  Past Medical History  Diagnosis Date  . ALLERGIC RHINITIS 05/05/2007  . BACK PAIN, LUMBAR, CHRONIC 08/06/2009  . BENIGN PROSTATIC HYPERTROPHY 08/01/2010  . CEREBROVASCULAR ACCIDENT, HX OF 08/06/2009  . CHOLELITHIASIS 08/01/2010  . CONGESTIVE HEART FAILURE 03/18/2009  . DEPRESSION 03/18/2009  . DIABETES MELLITUS, TYPE II 03/25/2007  . ERECTILE DYSFUNCTION 03/25/2007  . GERD 03/25/2007  . HEPATITIS C, HX OF 03/25/2007  . HYPERTENSION 03/25/2007  . Morbid obesity 03/25/2007  . NEPHROLITHIASIS, HX OF 03/18/2009  . ESRD on hemodialysis 09/08/2012    ESRD due to DM/HTN. Started dialysis in November 2013. Has LUA AVF placed Jun 14 2012 which is maturing as of Feb 2014.    Past Surgical History:  Past Surgical History  Procedure Laterality Date  . Nephrectomy      partial RR  . Av fistula placement  06/14/2012    Procedure: ARTERIOVENOUS (AV) FISTULA CREATION;  Surgeon: Chuck Hint, MD;  Location: [facility] OR;  Service: Vascular;  Laterality: Left;  Left basilic vein transposition with fistula.  . Tibia im nail insertion Left 09/09/2012    Procedure: INTRAMEDULLARY (IM) NAIL TIBIAL;   Surgeon: Eulas Post, MD;  Location: [facility] OR;  Service: Orthopedics;  Laterality: Left;  left tibial nail and open reduction internal fixation left fibula fracture  . Orif fibula fracture Left 09/09/2012    Procedure: OPEN REDUCTION INTERNAL FIXATION (ORIF) FIBULA FRACTURE;  Surgeon: Eulas Post, MD;  Location: [facility] OR;  Service: Orthopedics;  Laterality: Left;   Social History:  reports that he has never smoked. He does not have any smokeless tobacco history on file. He reports that he does not drink alcohol or use illicit drugs.  Family / Support Systems Marital Status: Married How Long?: 10 yrs. (2nd marriage for both) Patient Roles: Spouse Spouse/Significant Other: wife,  Oscar Castillo @ (H) [phone] or (W) [phone] or [phone] Children: wife has one adult son and pt  has two adult children - notes his children are local but only limited contact with them. Anticipated Caregiver: Wife Ability/Limitations of Caregiver: no lifting Caregiver Availability: Intermittent (Wife works full-time but is available to drive pt to HD.) Family Dynamics: Wife appears supportive, however, does work full-time and no plan to take time off.   Wife concerned about pt's apparent disinterest in rehab - "I don't know what he's doing"  Social History Preferred language: English Religion: Baptist Cultural Background: NA Education: HS Read: Yes Write: Yes Employment Status: Disabled Fish farm manager Issues: none Guardian/Conservator: none   Abuse/Neglect Physical Abuse: Denies Verbal Abuse: Denies Sexual Abuse: Denies Exploitation of patient/patient's resources: Denies Self-Neglect: Denies  Emotional Status Pt's affect,  behavior adn adjustment status: Pt mumbles answers at times.  Looking at floor throughout most of interview.  Some answers do not make sense but pt catches this sometimes and self-corrects.  Answers are very brief and pt appears very disinterested in interview process.   He denies any depression history or any s/s currently of depression.  Have discussed this presentation with neuropsychologist who evaluated pt yesterday afternoon to attempt to screen for depression or congitive issues. Recent Psychosocial Issues: Had to start HD this past November which, per wife, "he was really depressed abou that...he really didn't want to do it...took him a week to finally give the ok for them to put in the graft."  Wife notes that his "attitude" seemed to improve in January when he began driving himself to HD and was more independent. Pyschiatric History: Chart notes "depression" in hx, however, pt denies.  Wife also reports she feels pt was "really depressed" with the start of HD, however, denies any cognitive issues for pt. Substance Abuse History: none  Patient / Family Perceptions, Expectations & Goals Pt/Family understanding of illness & functional limitations: Pt and wife with basic understanding of his injury and aware he is NWB Premorbid pt/family roles/activities: Per wife, pt had been able to resume driving and was more active just prior to this fall.  Wife is primary wage earner (only one working) Anticipated changes in roles/activities/participation: Pt may require assistance at home if he does not engage more with therapies which will mean that wife either assume an increase in caregiver demands vs SNF Pt/family expectations/goals: Wife hopeful he can reach mod i level so he is safe to be home alone.  Pt would not comment on his goals.  Community Resources [employer]: Other (Comment) (HD and [facility]) Premorbid Home Care/DME Agencies: None Transportation available at discharge: intermittently - wife reports she can resume providing transport to HD Resource referrals recommended: Neuropsychology  Discharge Planning Living Arrangements: Spouse/significant other Support Systems: Spouse/significant other Type of Residence: Private  residence Insurance Resources: [employer] Financial Resources: [employer] Financial Screen Referred: No Living Expenses: Database administrator Management: Spouse Do you have any problems obtaining your medications?: No Home Management: wife primarily Patient/Family Preliminary Plans: Plans at admit are for pt to return home with wife who works full-time days - IF he reaches Mod I goals Barriers to Discharge: Other (Comment) (Pt's poor participation ) Social Work Anticipated Follow Up Needs: HH/OP Expected length of stay: 7-10 days  Clinical Impression Very disengaged gentleman here after fall and tib-fib fx (NWB).  Needs to reach mod i level in order to return home.  Difficult to determine cause of this disengagement.  Wife denies any premorbid cognitive issues, however does not he was "very depressed" after start of HD.  Will monitor participation closely and neuropsych following as well.  Diante Barley 09/15/2012, 9:30 AM

## 2012-09-15 NOTE — Progress Notes (Signed)
ANTICOAGULATION CONSULT NOTE - Follow Up Consult  Pharmacy Consult for coumadin Indication: VTE prophylaxis  No Known Allergies  Patient Measurements: Weight: 215 lb 9.8 oz (97.8 kg) Heparin Dosing Weight:   Vital Signs: Temp: 98.6 F (37 C) (02/20 0500) Temp src: Oral (02/20 0500) BP: 132/80 mmHg (02/20 0756) Pulse Rate: 79 (02/20 0756)  Labs:  Recent Labs  09/13/12 0830 09/13/12 1615 09/14/12 0655 09/15/12 0620  HGB  --  9.4*  --   --   HCT  --  27.4*  --   --   PLT  --  293  --   --   LABPROT 31.4*  --  27.7* 34.7*  INR 3.25*  --  2.75* 3.73*  CREATININE  --  12.63*  --   --     The CrCl is unknown because both a height and weight (above a minimum accepted value) are required for this calculation.   Medications:  Scheduled:  . [COMPLETED] sodium chloride 0.9% NICU IV bolus  300 mL Intravenous Once  . amLODipine  10 mg Oral Daily  . brimonidine  1 drop Both Eyes BID  . brinzolamide  1 drop Both Eyes BID  . calcium acetate  1,334 mg Oral TID WC  . darbepoetin (ARANESP) injection - DIALYSIS  12.5 mcg Intravenous Q Tue-HD  . docusate sodium  100 mg Oral BID  . doxercalciferol  6 mcg Intravenous Q T,Th,Sa-HD  . ferric gluconate (FERRLECIT/NULECIT) IV  62.5 mg Intravenous Weekly  . insulin aspart  0-9 Units Subcutaneous TID WC  . labetalol  200 mg Oral BID  . pantoprazole  40 mg Oral Daily  . patient's guide to using coumadin book   Does not apply Once  . polyethylene glycol  68 g Oral Daily  . Travoprost (BAK Free)  1 drop Both Eyes QHS  . [COMPLETED] warfarin  5 mg Oral ONCE-1800  . Warfarin - Pharmacist Dosing Inpatient   Does not apply q1800  . [DISCONTINUED] insulin aspart  0-5 Units Subcutaneous QHS  . [DISCONTINUED] labetalol  300 mg Oral BID   Infusions:    Assessment: 62 yo male s/p ortho surgery is currently on supratherapeutic coumadin.  INR jumped to 3.73 from 2.75.  Only had 5mg  of coumadin yesterday; patient is very sensitive to coumadin. Goal  of Therapy:  INR 2-3    Plan:  1) No coumadin tonight. 2) INR in am.  Bridgitt Raggio, Tsz-Yin 09/15/2012,8:27 AM

## 2012-09-15 NOTE — Progress Notes (Signed)
Occupational Therapy Session Note  Patient Details  Name: Oscar Castillo MRN: [id number] Date of Birth: 02-11-1951  Today's Date: 09/15/2012 Time: 2956-2130 Time Calculation (min): 55 min  Short Term Goals: Week 1:  OT Short Term Goal 1 (Week 1): Pt will perform LB bathing sit to stand with mod assist. OT Short Term Goal 2 (Week 1): Pt will perform LB dressing sit to stand with mod assist. OT Short Term Goal 3 (Week 1): Pt will perform stand pivot or scoot pivot transfers to bedside toilet with mod assist. OT Short Term Goal 4 (Week 1): Pt will demonstrate anticipatory awareness by stating that he needs assistance with all of his transfers and sit to stand when questioned by therapist. OT Short Term Goal 5 (Week 1): Pt will transfer from supine to sit EOB with no more than min assist in preparation for selfcare tasks.    Skilled Therapeutic Interventions/Progress Updates:  Patient with 8/10 complaints of pain in LLE, RN made aware Patient found supine in bed. Patient required max encouragement verbal cues to sit edge of bed. Once seated edge of bed therapist handed patient right shoe to donn, patient required assistance with getting heel in shoe and tying shoe. Patient then attempted to lay back down secondary to pain. Patient required max encouragement to transfer from edge of bed -> w/c with minimal assistance. Patient then sat in w/c to eat breakfast. In order to finish breakfast patient required max verbal cues. After breakfast patient propelled self halfway to gym, therapist propelled patient the rest of the way and left seated in gym for next therapy session.   Precautions:  Precautions Precautions: Fall Restrictions Weight Bearing Restrictions: Yes LLE Weight Bearing: Non weight bearing  See FIM for current functional status  Therapy/Group: Individual Therapy  Ladashia Demarinis 09/15/2012, 8:33 AM

## 2012-09-16 ENCOUNTER — Inpatient Hospital Stay (HOSPITAL_COMMUNITY): Payer: Medicare Other

## 2012-09-16 ENCOUNTER — Inpatient Hospital Stay (HOSPITAL_COMMUNITY): Payer: Medicare Other | Admitting: Occupational Therapy

## 2012-09-16 ENCOUNTER — Inpatient Hospital Stay (HOSPITAL_COMMUNITY): Payer: Medicare Other | Admitting: Physical Therapy

## 2012-09-16 ENCOUNTER — Encounter (HOSPITAL_COMMUNITY): Payer: Medicare Other

## 2012-09-16 DIAGNOSIS — S82209A Unspecified fracture of shaft of unspecified tibia, initial encounter for closed fracture: Secondary | ICD-10-CM

## 2012-09-16 DIAGNOSIS — W19XXXA Unspecified fall, initial encounter: Secondary | ICD-10-CM

## 2012-09-16 DIAGNOSIS — S8263XA Displaced fracture of lateral malleolus of unspecified fibula, initial encounter for closed fracture: Secondary | ICD-10-CM

## 2012-09-16 DIAGNOSIS — E1165 Type 2 diabetes mellitus with hyperglycemia: Secondary | ICD-10-CM

## 2012-09-16 DIAGNOSIS — E1142 Type 2 diabetes mellitus with diabetic polyneuropathy: Secondary | ICD-10-CM

## 2012-09-16 DIAGNOSIS — N186 End stage renal disease: Secondary | ICD-10-CM

## 2012-09-16 LAB — PROTIME-INR
INR: 3.41 — ABNORMAL HIGH (ref 0.00–1.49)
Prothrombin Time: 32.5 seconds — ABNORMAL HIGH (ref 11.6–15.2)

## 2012-09-16 LAB — GLUCOSE, CAPILLARY
Glucose-Capillary: 278 mg/dL — ABNORMAL HIGH (ref 70–99)
Glucose-Capillary: 208 mg/dL — ABNORMAL HIGH (ref 70–99)

## 2012-09-16 MED ORDER — SODIUM CHLORIDE 0.9 % IV SOLN
125.0000 mg | INTRAVENOUS | Status: DC
  Administered 2012-09-18 – 2012-09-22 (×3): 125 mg via INTRAVENOUS
  Filled 2012-09-16 (×7): qty 10

## 2012-09-16 MED ORDER — SODIUM CHLORIDE 0.9 % IV SOLN: 125.0000 mg | INTRAVENOUS | Status: DC

## 2012-09-16 NOTE — Progress Notes (Signed)
ANTICOAGULATION CONSULT NOTE - Follow Up Consult  Pharmacy Consult for coumadin Indication: VTE prophylaxis  No Known Allergies  Patient Measurements: Weight: 207 lb 8 oz (94.121 kg) Heparin Dosing Weight:   Vital Signs: Temp: 99.1 F (37.3 C) (02/21 0500) BP: 119/77 mmHg (02/21 0500) Pulse Rate: 78 (02/21 0500)  Labs:  Recent Labs  09/13/12 1615 09/14/12 0655 09/15/12 0620 09/15/12 1400 09/16/12 0610  HGB 9.4*  --   --  9.6*  --   HCT 27.4*  --   --  28.4*  --   PLT 293  --   --  350  --   LABPROT  --  27.7* 34.7*  --  32.5*  INR  --  2.75* 3.73*  --  3.41*  CREATININE 12.63*  --   --  11.82*  --     The CrCl is unknown because both a height and weight (above a minimum accepted value) are required for this calculation.   Medications:  Scheduled:  . amLODipine  10 mg Oral Daily  . brimonidine  1 drop Both Eyes BID  . brinzolamide  1 drop Both Eyes BID  . calcium acetate  1,334 mg Oral TID WC  . darbepoetin (ARANESP) injection - DIALYSIS  12.5 mcg Intravenous Q Tue-HD  . docusate sodium  100 mg Oral BID  . doxercalciferol  6 mcg Intravenous Q T,Th,Sa-HD  . ferric gluconate (FERRLECIT/NULECIT) IV  62.5 mg Intravenous Weekly  . [COMPLETED] heparin  1,000 Units Dialysis Once in dialysis  . insulin aspart  0-9 Units Subcutaneous TID WC  . labetalol  200 mg Oral BID  . pantoprazole  40 mg Oral Daily  . patient's guide to using coumadin book   Does not apply Once  . polyethylene glycol  68 g Oral Daily  . Travoprost (BAK Free)  1 drop Both Eyes QHS  . Warfarin - Pharmacist Dosing Inpatient   Does not apply q1800   Infusions:    Assessment: 62 yo male s/p ORIF is currently on supratherapeutic coumadin.  INR is down to 3.41 (no coumadin last night). Goal of Therapy:  INR 2-3    Plan:  1) No coumadin tonight 2) INR in am.  Adriauna Campton, Tsz-Yin 09/16/2012,8:32 AM

## 2012-09-16 NOTE — Progress Notes (Signed)
Subjective:  Back from gym , "I'm sweating"  No cos. Objective Vital signs in last 24 hours: Filed Vitals:   09/15/12 1817 09/15/12 1830 09/15/12 1834 09/16/12 0500  BP: 102/60 100/71 119/75 119/77  Pulse: 79 80 78 78  Temp:   97.1 F (36.2 C) 99.1 F (37.3 C)  TempSrc:   Oral   Resp: 20 18 20 18   Weight:   94.5 kg (208 lb 5.4 oz) 94.121 kg (207 lb 8 oz)  SpO2:   100% 98%   Weight change: -0.6 kg (-1 lb 5.2 oz)  Intake/Output Summary (Last 24 hours) at 09/16/12 1039 Last data filed at 09/16/12 0900  Gross per 24 hour  Intake    290 ml  Output   2915 ml  Net  -2625 ml   Labs: Basic Metabolic Panel:  Recent Labs Lab 09/11/12 0526 09/13/12 1615 09/15/12 1400  NA 132* 130* 131*  K 4.7 5.1 5.0  CL 91* 87* 89*  CO2 22 23 23   GLUCOSE 191* 166* 200*  BUN 46* 82* 69*  CREATININE 8.03* 12.63* 11.82*  CALCIUM 9.3 9.1 9.8  PHOS  --  9.4* 8.0*   Liver Function Tests:  Recent Labs Lab 09/13/12 1615 09/15/12 1400  ALBUMIN 2.9* 2.8*   No results found for this basename: LIPASE, AMYLASE,  in the last 168 hours No results found for this basename: AMMONIA,  in the last 168 hours CBC:  Recent Labs Lab 09/10/12 0630 09/11/12 0526 09/12/12 0540 09/13/12 1615 09/15/12 1400  WBC 7.9 11.6* 8.6 7.0 8.8  HGB 11.4* 10.5* 9.7* 9.4* 9.6*  HCT 34.6* 31.1* 29.3* 27.4* 28.4*  MCV 84.4 83.2 82.8 81.5 81.4  PLT 236 223 245 293 350   Cardiac Enzymes: No results found for this basename: CKTOTAL, CKMB, CKMBINDEX, TROPONINI,  in the last 168 hours CBG:  Recent Labs Lab 09/14/12 2122 09/15/12 0715 09/15/12 1105 09/15/12 2110 09/16/12 0719  GLUCAP 197* 161* 254* 197* 181*    Iron Studies:  Recent Labs  09/15/12 1400  IRON 21*  TIBC 182*  FERRITIN 306   Studies/Results: No results found. Medications:   . amLODipine  10 mg Oral Daily  . brimonidine  1 drop Both Eyes BID  . brinzolamide  1 drop Both Eyes BID  . calcium acetate  1,334 mg Oral TID WC  . darbepoetin  (ARANESP) injection - DIALYSIS  12.5 mcg Intravenous Q Tue-HD  . docusate sodium  100 mg Oral BID  . doxercalciferol  6 mcg Intravenous Q T,Th,Sa-HD  . ferric gluconate (FERRLECIT/NULECIT) IV  62.5 mg Intravenous Weekly  . insulin aspart  0-9 Units Subcutaneous TID WC  . labetalol  200 mg Oral BID  . pantoprazole  40 mg Oral Daily  . patient's guide to using coumadin book   Does not apply Once  . polyethylene glycol  68 g Oral Daily  . Travoprost (BAK Free)  1 drop Both Eyes QHS  . Warfarin - Pharmacist Dosing Inpatient   Does not apply q1800   I  have reviewed scheduled and prn medications.  Physical Exam: General: alert, Nad, Appropriate Heart: RRR, No rub or murmur  Lungs: CTA bilaterally Abdomen: soft , nontender Extremities: Dialysis Access: no pedal edema right /left  Leg casted/   Pos. Bruit  L U A AVF and right ij perm cath    Dialysis Orders: Center: [place] on TTS.  EDW 99 kg HD Bath 2K/2.25Ca Time 4 hrs Heparin 5000 U. Access Right IJ catheter, also  L BVT (placed 06/14/12) BFR 400 DFR 800 Hectorol 6 mcg IV/HD Epogen 0 Units IV/HD Venofer 50 mg on Thurs.   Assessment/Plan:  1. Displaced left tibial fx - s/p ORIF 2/14 - starting rehab 2. ESRD, hd TTS ( [facility])-- BVT in L upper arm is 60 mo old and maturing = did use 2/18  And yesterday at 250 - BFR  continue to use tomorrow 3. HTN/volume- 119/77 bp this am and Had hypotensive episode yesterday to SBP= 93 on hd afternoon2954ml uf. Pulled to  94.5kg/  labetolol was reduced to 200 bid. Also on 10 Norvasc q day. Will dc norvasc and uf only 1 to 1.5 liter in am  and keep systolic BP > 120  Old  edw  99kg listed at [facility] 4. Anemia - Hgb 9.6 - dropped post surgery, wason no outpatient Epogen- restarted low dose, Venofer 50 mg q wk -  iron studies TFS = 12%  Ferritin 306 , increase Iron 5. Metabolic bone disease - P 9.4 to 8.0  After  Phoslo 2 with meals (resumed 2/18)--. On Hectorol 6 mcg hd 6. Nutrition - Alb 3.3, changed to high protein  renal diet.- add multivit 7. DM - on insulin per primary. 8. Hx CVA - in 07/2009. 9. Hx nephrolithiasis 10. Short term coumadin for VTE prophylaxis - INR high = 3.41 per pharmacy   Lenny Pastel, PA-C [facility] Beeper [phone] 09/16/2012,10:39 AM  LOS: 4 days  Mr. Hijazi is sitting in chair eating lunch after PT.  Fe stores low--will rx Nulecit 125 x 8, then once/wk.  Phos high--will check in HD tomorrow.  D/C saline lock

## 2012-09-16 NOTE — Progress Notes (Signed)
Occupational Therapy Session Note  Patient Details  Name: Oscar Castillo MRN: [id number] Date of Birth: August 21, 1950  Today's Date: 09/16/2012 Time: 0830-0930 Time Calculation (min): 60 min  Short Term Goals: Week 1:  OT Short Term Goal 1 (Week 1): Pt will perform LB bathing sit to stand with mod assist. OT Short Term Goal 2 (Week 1): Pt will perform LB dressing sit to stand with mod assist. OT Short Term Goal 3 (Week 1): Pt will perform stand pivot or scoot pivot transfers to bedside toilet with mod assist. OT Short Term Goal 4 (Week 1): Pt will demonstrate anticipatory awareness by stating that he needs assistance with all of his transfers and sit to stand when questioned by therapist. OT Short Term Goal 5 (Week 1): Pt will transfer from supine to sit EOB with no more than min assist in preparation for selfcare tasks.    Skilled Therapeutic Interventions/Progress Updates:  Patient found seated in w/c with quick release belt donned and 7/10 complaints of pain in LLE; RN aware. Patient stated he already completed bathing & dressing. From room patient propelled self -> therapy gym with min verbal cues. Once in gym patient transferred onto therapy mat with close supervision and moderate verbal cues, patient required assistance with w/c set-up prior to transfer. Once on therapy mat patient completed tricep dips using blocks (3 sets of 7 reps). Patient then transferred back to w/c and therapist propelled patient -> ADL apartment for simulated tub/shower transfer on/off tub transfer bench (close supervision for transfer <->). Therapist propelled patient back to room and focused on sit/stands in front of sink (max assist for sit-> stand secondary to therapist assisting patient with lifting and lowering). At end of session left patient seated in w/c with quick release belt donned and call bell & phone within reach.   Precautions:  Precautions Precautions: Fall Restrictions Weight Bearing Restrictions:  Yes LLE Weight Bearing: Non weight bearing  See FIM for current functional status  Therapy/Group: Individual Therapy  Tymeer Vaquera 09/16/2012, 9:34 AM

## 2012-09-16 NOTE — Progress Notes (Signed)
Physical Therapy Session Note  Patient Details  Name: Oscar Castillo MRN: [id number] Date of Birth: 01/29/1951  Today's Date: 09/16/2012 Time: 0730-0828 Time Calculation (min): 58 min  Short Term Goals: Week 1:  PT Short Term Goal 1 (Week 1): Pt will be able to transfer with min A squat pivot PT Short Term Goal 2 (Week 1): Pt will be able to to sit to stand with min A PT Short Term Goal 3 (Week 1): Pt will be able to gait with RW x 10' with mod A PT Short Term Goal 4 (Week 1): Pt will be able to demonstrate dynamic standing balance during functional task with min A  Skilled Therapeutic Interventions/Progress Updates:   Wife present for start of treatment, discussed entrance. Wife reports pt has 2nd option with level entry however will have one step up from living room to rest of house. Advised to get small ramp for inside house.    Bed mobility with min assist, mod verbal cues for sequencing tactile cues and assist for sequencing and efficiency. Wheelchair mobility to gym, supervision cues for direction, avoiding obstacles, and encouragement. With increased time pt able to negotiate wheelchair for appropriate placement for transfer to mat, supervision cues for safety and parts management. Mod assist progressing to min assist for scoot transfers x 3 reps bil. Directions, increased processing time needed. Able to recall weight bearing precautions with questioning however needs tactile and verbal cues to remember during transfers.   Sit <> stands from slightly elevated mat with mod assist progressing to max assist with last rep secondary to fatigue. Standing balance with bil. UEs supported, min/mod assist for posterior lean. Cues for Rt. LE placement and anterior trunk translation, facilitation of anterior trunk translation. Placed pillow under Lt. Foot for reminder to remain non-weightbearing, pt able to remember throughout sit <> stand activity without verbal cues.    Therapy  Documentation Precautions:  Precautions Precautions: Fall Restrictions Weight Bearing Restrictions: Yes LLE Weight Bearing: Non weight bearing Pain: Pain Assessment Pain Assessment: No/denies pain Pain Score:   7 Pain Location: Leg Pain Orientation: Left Pain Descriptors: Aching Pain Onset: Progressive Pain Intervention(s): RN made aware (RN brought pain medication)  See FIM for current functional status  Therapy/Group: Individual Therapy  Wilhemina Bonito 09/16/2012, 8:53 AM

## 2012-09-16 NOTE — Progress Notes (Signed)
Subjective/Complaints: No new issues. Still slow to process. "Did OK with therapies yesterday" A 12 point review of systems has been performed and if not noted above is otherwise negative.   Objective: Vital Signs: Blood pressure 119/77, pulse 78, temperature 99.1 F (37.3 C), temperature source Oral, resp. rate 18, weight 94.121 kg (207 lb 8 oz), SpO2 98.00%. No results found.  Recent Labs  09/13/12 1615 09/15/12 1400  WBC 7.0 8.8  HGB 9.4* 9.6*  HCT 27.4* 28.4*  PLT 293 350    Recent Labs  09/13/12 1615 09/15/12 1400  NA 130* 131*  K 5.1 5.0  CL 87* 89*  GLUCOSE 166* 200*  BUN 82* 69*  CREATININE 12.63* 11.82*  CALCIUM 9.1 9.8   CBG (last 3)   Recent Labs  09/15/12 1105 09/15/12 2110 09/16/12 0719  GLUCAP 254* 197* 181*    Wt Readings from Last 3 Encounters:  09/16/12 94.121 kg (207 lb 8 oz)  09/12/12 101.3 kg (223 lb 5.2 oz)  09/12/12 101.3 kg (223 lb 5.2 oz)    Physical Exam:  Constitutional: He appears well-developed and well-nourished. No distress.  HENT:  Head: Normocephalic and atraumatic.  Right Ear: External ear normal.  Left Ear: External ear normal.  Nose: Nose normal.  Mouth/Throat: Oropharynx is clear and moist.  Eyes: Conjunctivae and EOM are normal. Right eye exhibits no discharge. Left eye exhibits no discharge. No scleral icterus.  Pupils round and reactive to light  Neck: Neck supple. No JVD present. No tracheal deviation present. No thyromegaly present.  Cardiovascular: Normal rate and regular rhythm. Exam reveals no gallop and no friction rub.  No murmur heard.  Pulmonary/Chest: Effort normal and breath sounds normal. No respiratory distress. He has no wheezes. He has no rales. He exhibits no tenderness.  Abdominal: Soft. Bowel sounds are normal. He exhibits no distension. There is no tenderness. There is no rebound and no guarding.  Musculoskeletal:  Left leg surgery sites intact. Limb appropriately tender.  Lymphadenopathy:  He  has no cervical adenopathy.  Neurological: He is alert when he chooses to be. He displays normal reflexes. No cranial nerve deficit. Coordination normal.  Patient was able to provide his name, age and place.  He followed simple commands. UE are 5/5. He is unable to lift leg off chair rest. Moves toes of left foot. RLE is grossly 4- to 4/5. Stocking glove sensory loss in the feet  Skin: He is not diaphoretic.  Left Leg incisions are dressed. onchomycotic toe nails Psych: affect very flat.    Assessment/Plan: 1. Functional deficits secondary to right distal tibia shaft fx, right distal fibula fx which require 3+ hours per day of interdisciplinary therapy in a comprehensive inpatient rehab setting. Physiatrist is providing close team supervision and 24 hour management of active medical problems listed below. Physiatrist and rehab team continue to assess barriers to discharge/monitor patient progress toward functional and medical goals.  Pt still slow to process. Did participate in therapies yesterday. Pain an issue occasionally, but we have decreased medication to maximize cognitive awareness  FIM: FIM - Bathing Bathing Steps Patient Completed: Chest;Right Arm;Left Arm;Abdomen Bathing:  (for UB bathing, no LB bathing completed)  FIM - Upper Body Dressing/Undressing Upper body dressing/undressing steps patient completed: Thread/unthread right sleeve of pullover shirt/dresss;Thread/unthread left sleeve of pullover shirt/dress;Put head through opening of pull over shirt/dress;Pull shirt over trunk Upper body dressing/undressing: 5: Supervision: Safety issues/verbal cues FIM - Lower Body Dressing/Undressing Lower body dressing/undressing: 0: Activity did not occur  FIM -  Toileting Toileting: 0: Activity did not occur  FIM - Diplomatic Services operational officer Devices: Bedside commode;Elevated toilet seat;Grab bars Toilet Transfers: 3-To toilet/BSC: Mod A (lift or lower assist);3-From  toilet/BSC: Mod A (lift or lower assist)  FIM - Bed/Chair Transfer Bed/Chair Transfer Assistive Devices: Arm rests Bed/Chair Transfer: 4: Supine > Sit: Min A (steadying Pt. > 75%/lift 1 leg);4: Sit > Supine: Min A (steadying pt. > 75%/lift 1 leg);4: Bed > Chair or W/C: Min A (steadying Pt. > 75%);2: Chair or W/C > Bed: Max A (lift and lower assist);2: Bed > Chair or W/C: Max A (lift and lower assist)  FIM - Locomotion: Wheelchair Distance: 200 Locomotion: Wheelchair: 4: Travels 150 ft or more: maneuvers on rugs and over door sillls with minimal assistance (Pt.>75%) FIM - Locomotion: Ambulation Locomotion: Ambulation Assistive Devices: Walker - Rolling Locomotion: Ambulation: 0: Activity did not occur  Comprehension Comprehension Mode: Auditory Comprehension: 1-Understands basic less than 25% of the time/requires cueing 75% of the time  Expression Expression Mode: Verbal Expression: 1-Expresses basis less than 25% of the time/requires cueing greater than 75% of the time.  Social Interaction Social Interaction: 1-Interacts appropriately less than 25% of the time. May be withdrawn or combative.  Problem Solving Problem Solving: 2-Solves basic 25 - 49% of the time - needs direction more than half the time to initiate, plan or complete simple activities  Memory Memory: 4-Recognizes or recalls 75 - 89% of the time/requires cueing 10 - 24% of the time  Medical Problem List and Plan:  1. Distal shaft tibia fracture, distal fibula fracture. Status post intramedullary nail, ORIF 09/09/2012  2. DVT Prophylaxis/Anticoagulation: Coumadin for DVT prophylaxis. Continue subcutaneous heparin until INR greater than 2.00. Monitor for any signs of bleeding. Doppler pending today 3. Pain Management: Hydrocodone as needed.--decreased frequency 4. Neuropsych: This patient is capable of making decisions on his/her own behalf.  -he is not at baseline cognitively per wife. ?med effect---dc'ed  hydrocodone  -I suspect a large behavioral component here as well 5. Diabetes mellitus with peripheral neuropathy. Hemoglobin A1c 7.8. Currently with sliding scale insulin. Check blood sugars a.c. and at bedtime. Sugars elevated but not substantially, will hold on standing adjustments for now 6. End-stage renal disease. Continue hemodialysis as directed per original services.  7. Hypertension. Norvasc 10 mg daily, labetalol 300 mg twice a day. Monitor with increased activity  8. Chronic anemia. Continue Nulecit weekly with dialysis. Followup CBC with dialysis. Latest hemoglobin 9.7  9. Diastolic congestive heart failure. Following I's and O's, weights.  LOS (Days) 4 A FACE TO FACE EVALUATION WAS PERFORMED  Serrena Linderman T 09/16/2012 7:48 AM

## 2012-09-16 NOTE — Progress Notes (Signed)
Occupational Therapy Note  Patient Details  Name: Oscar Castillo MRN: [id number] Date of Birth: [birth year] Today's Date: 09/16/2012  Time: 4098-1191 Pt denies pain Individual Therapy  Pt up in w/c and agreeable to participating in therex in gym.  Pt propelled w/c to nurses station with max verbal cues to avoid running into wall and other objects.  Pt required rest breaks X 3 during this activity.  Pt engaged in therex in gym: UE ergonometer (level 2 X 3 min X 4) and PNF exercises with 3kg weighted ball.  Pt propelled w/c from gym to nurses station at end of session.  Pt returned to room.   [name] [facility] 09/16/2012, 11:31 AM

## 2012-09-16 NOTE — Progress Notes (Signed)
Occupational Therapy Note  Patient Details  Name: Oscar Castillo MRN: [id number] Date of Birth: 05/17/1951 Today's Date: 09/16/2012  Time: 1400-1430 Pt denies pain Individual Therapy  Pt in bed asleep but agreeable with encouragement to sit EOB for sit<>stand while adhering to weight bearing precautions.  Pt performed supine to sit with min A and required min/mod A for sit<>stands.  Pt required min verbal cues for weight bearing precautions and I placed my foot under his to assure adherence to non weight bearing.  While seated EOB pt had difficulty keeping eyes open.   Pt required min A for sit->supine at end of session.  [name] [facility] 09/16/2012, 2:38 PM

## 2012-09-17 ENCOUNTER — Inpatient Hospital Stay (HOSPITAL_COMMUNITY): Payer: Medicare Other | Admitting: Physical Therapy

## 2012-09-17 ENCOUNTER — Encounter (HOSPITAL_COMMUNITY): Payer: Medicare Other | Admitting: Occupational Therapy

## 2012-09-17 LAB — GLUCOSE, CAPILLARY: Glucose-Capillary: 182 mg/dL — ABNORMAL HIGH (ref 70–99)

## 2012-09-17 LAB — PROTIME-INR: Prothrombin Time: 28 seconds — ABNORMAL HIGH (ref 11.6–15.2)

## 2012-09-17 MED ORDER — INSULIN GLARGINE 100 UNIT/ML ~~LOC~~ SOLN
10.0000 [IU] | Freq: Every day | SUBCUTANEOUS | Status: DC
  Administered 2012-09-17 – 2012-09-18 (×2): 10 [IU] via SUBCUTANEOUS

## 2012-09-17 MED ORDER — WARFARIN SODIUM 5 MG PO TABS
5.0000 mg | ORAL_TABLET | Freq: Once | ORAL | Status: AC
  Administered 2012-09-17: 5 mg via ORAL
  Filled 2012-09-17: qty 1

## 2012-09-17 NOTE — Progress Notes (Signed)
Occupational Therapy Session Note  Patient Details  Name: Oscar Castillo MRN: [id number] Date of Birth: 07-13-1951  Today's Date: 09/17/2012 Time: 1000-1100 Time Calculation (min): 60 min  Short Term Goals: Week 1:  OT Short Term Goal 1 (Week 1): Pt will perform LB bathing sit to stand with mod assist. OT Short Term Goal 2 (Week 1): Pt will perform LB dressing sit to stand with mod assist. OT Short Term Goal 3 (Week 1): Pt will perform stand pivot or scoot pivot transfers to bedside toilet with mod assist. OT Short Term Goal 4 (Week 1): Pt will demonstrate anticipatory awareness by stating that he needs assistance with all of his transfers and sit to stand when questioned by therapist. OT Short Term Goal 5 (Week 1): Pt will transfer from supine to sit EOB with no more than min assist in preparation for selfcare tasks.    Skilled Therapeutic Interventions/Progress Updates:    1:1 self care retraining at sink level. When arrived pt in w/c and complaining of left LE pain- RN aware and pt reported had already taken meds. Focus on sit to stands and standing balance with at least 1 UE support at sink (4x) for peri hygiene and clothing management. Pt required A to thread pants over casting/ brace on left LE, emergent awareness and simple problem solving. Pt able to propel w/c to gym with extra time; focusing on UE strengthening, squat pivot with steady A over to mat; engaged in UE/ UB strengthening with weighted ball to assist with transfers and sit to stands and using UEs to compensate for NWB LE. Transferred into recliner for better comfort with LLE elevated for edema and pain control.   Therapy Documentation Precautions:  Precautions Precautions: Fall Restrictions Weight Bearing Restrictions: Yes LLE Weight Bearing: Non weight bearing Pain: Pain Assessment Pain Assessment: 0-10 Pain Score:   8 Pain Type: Acute pain Pain Location: Leg Pain Orientation: Left Pain Descriptors:  Aching;Sore Pain Onset: On-going Pain Intervention(s): Repositioned;Rest  See FIM for current functional status  Therapy/Group: Individual Therapy  Roney Mans [facility] 09/17/2012, 12:37 PM

## 2012-09-17 NOTE — Progress Notes (Signed)
Physical Therapy Session Note  Patient Details  Name: Oscar Castillo MRN: [id number] Date of Birth: Jun 18, 1951  Today's Date: 09/17/2012 Time: 1400-1443 Time Calculation (min): 43 min  Short Term Goals: Week 1:  PT Short Term Goal 1 (Week 1): Pt will be able to transfer with min A squat pivot PT Short Term Goal 2 (Week 1): Pt will be able to to sit to stand with min A PT Short Term Goal 3 (Week 1): Pt will be able to gait with RW x 10' with mod A PT Short Term Goal 4 (Week 1): Pt will be able to demonstrate dynamic standing balance during functional task with min A  Skilled Therapeutic Interventions/Progress Updates:    Session focused on stand pivot transfers to/from variety of surfaces including bed, recliner, wheelchair, and mat. Pt max assist progressing to mod assist. Pt has difficulty not touching lt. foot down however does not appear to place weight through foot. wheelchair mobility and set up performed with supervision cues. Pt fatigues quickly and requires multiple rest breaks.  Therapy Documentation Precautions:  Precautions Precautions: Fall Restrictions Weight Bearing Restrictions: Yes LLE Weight Bearing: Non weight bearing Pain: Pain Assessment Pain Score:   7 Pain Location: Leg Pain Orientation: Left Pain Descriptors: Aching Pain Onset: On-going Pain Intervention(s): Repositioned (rest as needed)  See FIM for current functional status  Therapy/Group: Individual Therapy  Wilhemina Bonito 09/17/2012, 4:00 PM

## 2012-09-17 NOTE — Progress Notes (Signed)
[facility]  Subjective:  In PT lifting weighted balls.  Not sure when d/c is planned.  For HD today   Objective: Vital signs in last 24 hours: Blood pressure 121/79, pulse 73, temperature 98.4 F (36.9 C), temperature source Oral, resp. rate 20, weight 93.5 kg (206 lb 2.1 oz), SpO2 100.00%.    PHYSICAL EXAM General--as above Chest--R IJ TDC, clear Heart--no rub Abd--nontender Extr--L UA AVF patent.  It has been used for HD last few times  Lab Results:   Recent Labs Lab 09/11/12 0526 09/13/12 1615 09/15/12 1400  NA 132* 130* 131*  K 4.7 5.1 5.0  CL 91* 87* 89*  CO2 22 23 23   BUN 46* 82* 69*  CREATININE 8.03* 12.63* 11.82*  GLUCOSE 191* 166* 200*  CALCIUM 9.3 9.1 9.8  PHOS  --  9.4* 8.0*     Recent Labs  09/15/12 1400  WBC 8.8  HGB 9.6*  HCT 28.4*  PLT 350     I have reviewed the patient's current medications. Scheduled: . brimonidine  1 drop Both Eyes BID  . brinzolamide  1 drop Both Eyes BID  . calcium acetate  1,334 mg Oral TID WC  . darbepoetin (ARANESP) injection - DIALYSIS  12.5 mcg Intravenous Q Tue-HD  . docusate sodium  100 mg Oral BID  . doxercalciferol  6 mcg Intravenous Q T,Th,Sa-HD  . ferric gluconate (FERRLECIT/NULECIT) IV  125 mg Intravenous Q T,Th,Sa-HD  . insulin aspart  0-9 Units Subcutaneous TID WC  . insulin glargine  10 Units Subcutaneous QHS  . labetalol  200 mg Oral BID  . pantoprazole  40 mg Oral Daily  . patient's guide to using coumadin book   Does not apply Once  . polyethylene glycol  68 g Oral Daily  . Travoprost (BAK Free)  1 drop Both Eyes QHS  . warfarin  5 mg Oral ONCE-1800  . Warfarin - Pharmacist Dosing Inpatient   Does not apply q1800    Assessment/Plan: 1. Displaced left tibial fx - s/p ORIF 2/14 - doing well in rehab 2. ESRD, hd TTS ( [facility])-- BVT in L upper arm is 53 mo old and maturing-- did use 2/18 And 2/20 at 250 BFR  continue to use today--will try 2 needles today--if works well then d/c  TDC 3. HTN/volume- BP 121/79.  Will d/c labetalol 4. Anemia - Hgb 9.6 - dropped post surgery, was on no outpatient Epogen- restarted Aranesp 12.5/wk q wk - iron studies TFS = 12% Ferritin 306 ,on Nulecit load. 5. Metabolic bone disease - P 9.4 to 8.0 After Phoslo 2 with meals (resumed 2/18)--. On Hectorol 6 mcg hd.  Check phos in HD today 6. Nutrition - Alb 3.3, changed to high protein renal diet.- add multivit 7. DM - on insulin per primary. 8. Hx CVA - in 07/2009. 9. Hx nephrolithiasis 10. Short term coumadin for VTE prophylaxis - INR high = 2.79 today-- per pharmacy     LOS: 5 days   Jameyah Fennewald F 09/17/2012,10:46 AM   .labalb

## 2012-09-17 NOTE — Progress Notes (Signed)
Physical Therapy Session Note  Patient Details  Name: Oscar Castillo MRN: [id number] Date of Birth: 10-06-1950  Today's Date: 09/17/2012 Time: 4098-1191 Time Calculation (min): 55 min  Short Term Goals: Week 1:  PT Short Term Goal 1 (Week 1): Pt will be able to transfer with min A squat pivot PT Short Term Goal 2 (Week 1): Pt will be able to to sit to stand with min A PT Short Term Goal 3 (Week 1): Pt will be able to gait with RW x 10' with mod A PT Short Term Goal 4 (Week 1): Pt will be able to demonstrate dynamic standing balance during functional task with min A  Skilled Therapeutic Interventions/Progress Updates:    Bed mobility with min assist, verbal cues for sequencing and efficiency limited carry over from previous sessions. Wheelchair mobility to gym for strengthening, conditioning, and safety with use of wheelchair, supervision for cues to avoid obstacles. Worked on setting up for transfers, pt able to remember brakes and weightbearing status and position of wheelchair however needs max cues to remember to remove leg rests, armrest, and how to position self for scoot transfer. Scoot transfers performed with min/mod assist only for transition over wheel otherwise pt able to perform with supervision. Attempted horseshoe toss in standing however pt felt cast "too tight" and unable to complete task, RN made aware. Supine Lt. LE exercises: Hip abduction/adduction, heel slides, SLR, knee extension AAROM 2 x 8 reps. Pt fatigues easily, slow processing.    Continues to be limited by impaired cognition, slow processing, and pain.  Therapy Documentation Precautions:  Precautions Precautions: Fall Restrictions Weight Bearing Restrictions: Yes LLE Weight Bearing: Non weight bearing Pain: Pain Assessment Pain Assessment: 0-10 Pain Score:   8 Pain Type: Acute pain Pain Location: Leg Pain Orientation: Left Pain Descriptors: Aching;Sore Pain Onset: On-going Pain Intervention(s):  Repositioned;Rest   See FIM for current functional status  Therapy/Group: Individual Therapy  Wilhemina Bonito 09/17/2012, 12:32 PM

## 2012-09-17 NOTE — Consult Note (Signed)
09/16/12  PSYCHOSOCIAL NOTE - CONFIDENTIAL  [facility]   Mr. Oscar Castillo was seen on the [facility].  He was accompanied by his wife. Throughout this visit, the patient was much more lucid and responsive. During a previous visit, he was almost entirely unresponsive and unable to maintain a conversation.   Oscar Castillo reported noticing a sudden decrease in the patient's responsiveness following the initiation of pain medication, as well as an improvement over the last few days once those drugs were modified or discontinued. She feels that he is almost 80% back to normal for him. She believes that the altered mental status that occurred was due to the combination of medication side effects and renal disease treatment. I am inclined to concur.  Overall, it appears that Oscar Castillo is suffering from a medication induced altered mental status that is somewhat fluctuating, but improving. It is my plan to routinely check on him to make sure that he continues to improve. In addition, owing to Oscar Castillo reports of feeling quite tense and "down" at times, as well as observed physical restless, I recommend providing supportive therapy and ego support throughout his admission. His providers should also consider implementing an antidepressant.   Of note, if Oscar Castillo' mental status worsens for any reason, neuroimaging in addition to certain lab tests may be warranted to rule out any acute process that might be playing a role.   Greater than 50% of this visit was spent educating the patient about the possible diagnosis, prognosis, management plan, and in coordination of care.   REFERRING DIAGNOSIS: Altered mental status  FINAL DIAGNOSES:  Altered mental status Adjustment disorder with mixed depression and anxiety  Total professional time including medical record review and feedback equals 4 units (34742).   Debbe Mounts, Psy.D.  Clinical  Neuropsychologist

## 2012-09-17 NOTE — Progress Notes (Signed)
ANTICOAGULATION CONSULT NOTE - Follow Up Consult  Pharmacy Consult for coumadin Indication: VTE prophylaxis  No Known Allergies  Patient Measurements: Weight: 206 lb 2.1 oz (93.5 kg) Heparin Dosing Weight:   Vital Signs: Temp: 98.4 F (36.9 C) (02/22 0506) Temp src: Oral (02/22 0506) BP: 121/79 mmHg (02/22 0506) Pulse Rate: 73 (02/22 0506)  Labs:  Recent Labs  09/15/12 0620 09/15/12 1400 09/16/12 0610 09/17/12 0725  HGB  --  9.6*  --   --   HCT  --  28.4*  --   --   PLT  --  350  --   --   LABPROT 34.7*  --  32.5* 28.0*  INR 3.73*  --  3.41* 2.79*  CREATININE  --  11.82*  --   --     The CrCl is unknown because both a height and weight (above a minimum accepted value) are required for this calculation.   Medications:  Scheduled:  . brimonidine  1 drop Both Eyes BID  . brinzolamide  1 drop Both Eyes BID  . calcium acetate  1,334 mg Oral TID WC  . darbepoetin (ARANESP) injection - DIALYSIS  12.5 mcg Intravenous Q Tue-HD  . docusate sodium  100 mg Oral BID  . doxercalciferol  6 mcg Intravenous Q T,Th,Sa-HD  . ferric gluconate (FERRLECIT/NULECIT) IV  125 mg Intravenous Q T,Th,Sa-HD  . insulin aspart  0-9 Units Subcutaneous TID WC  . insulin glargine  10 Units Subcutaneous QHS  . labetalol  200 mg Oral BID  . pantoprazole  40 mg Oral Daily  . patient's guide to using coumadin book   Does not apply Once  . polyethylene glycol  68 g Oral Daily  . Travoprost (BAK Free)  1 drop Both Eyes QHS  . Warfarin - Pharmacist Dosing Inpatient   Does not apply q1800  . [DISCONTINUED] amLODipine  10 mg Oral Daily  . [DISCONTINUED] ferric gluconate (FERRLECIT/NULECIT) IV  125 mg Intravenous Weekly  . [DISCONTINUED] ferric gluconate (FERRLECIT/NULECIT) IV  62.5 mg Intravenous Weekly   Infusions:    Assessment: 62 yo M s/p L tibial fracture and ORIF 09/09/12  Anticoagulation: Coumadin for VTE px; INR down to 2.79. No bleeding noted. Patient very sensitive to Coumadin  Goal of  Therapy:  INR 2-3    Plan:  1) Coumadin 5mg  po x 1 tonight to prevent INR from dropping too much more but will need much lower maintenance doses.  Misty Stanley Stillinger 09/17/2012,9:44 AM

## 2012-09-17 NOTE — Progress Notes (Signed)
Physical Therapy Session Note  Patient Details  Name: Oscar Castillo MRN: [id number] Date of Birth: 1951/05/13  Today's Date: 09/17/2012 Time: 1330-1400 Time Calculation (min): 30 min  Short Term Goals: Week 1:  PT Short Term Goal 1 (Week 1): Pt will be able to transfer with min A squat pivot PT Short Term Goal 2 (Week 1): Pt will be able to to sit to stand with min A PT Short Term Goal 3 (Week 1): Pt will be able to gait with RW x 10' with mod A PT Short Term Goal 4 (Week 1): Pt will be able to demonstrate dynamic standing balance during functional task with min A  Therapy Documentation Precautions:  Precautions Precautions: Fall Restrictions Weight Bearing Restrictions: Yes LLE Weight Bearing: Non weight bearing Pain: Pain Assessment Pain Score:   1/10 at rest and 7/10 by end of session with L LE in a dependent position  Pain Intervention(s): Repositioned;Rest   Therapeutic Activity:(15') Transfer training recliner<->w/c with min-A for stand-pivot transfer and patient maintaining NWB L LE, Transfers sit<->stand with min-A into RW (hopped 10' to recliner from w/c) Wheel chair management:(15') propelling w/c x 100' with fatigue in UE's, able to maneuver w/c with initial verbal cues for turning, needs assist with leg rest removal prior to transfers.   Therapy/Group: Individual Therapy  Rex Kras 09/17/2012, 1:43 PM

## 2012-09-17 NOTE — Progress Notes (Signed)
Subjective/Complaints: No new complaints "therapy went OK". Denies pain. A 12 point review of systems has been performed and if not noted above is otherwise negative.   Objective: Vital Signs: Blood pressure 121/79, pulse 73, temperature 98.4 F (36.9 C), temperature source Oral, resp. rate 20, weight 93.5 kg (206 lb 2.1 oz), SpO2 100.00%. No results found.  Recent Labs  09/15/12 1400  WBC 8.8  HGB 9.6*  HCT 28.4*  PLT 350    Recent Labs  09/15/12 1400  NA 131*  K 5.0  CL 89*  GLUCOSE 200*  BUN 69*  CREATININE 11.82*  CALCIUM 9.8   CBG (last 3)   Recent Labs  09/16/12 1105 09/16/12 1615 09/16/12 2049  GLUCAP 278* 167* 208*    Wt Readings from Last 3 Encounters:  09/17/12 93.5 kg (206 lb 2.1 oz)  09/12/12 101.3 kg (223 lb 5.2 oz)  09/12/12 101.3 kg (223 lb 5.2 oz)    Physical Exam:  Constitutional: He appears well-developed and well-nourished. No distress. Slow to arouse HENT:  Head: Normocephalic and atraumatic.  Right Ear: External ear normal.  Left Ear: External ear normal.  Nose: Nose normal.  Mouth/Throat: Oropharynx is clear and moist.  Eyes: Conjunctivae and EOM are normal. Right eye exhibits no discharge. Left eye exhibits no discharge. No scleral icterus.  Pupils round and reactive to light  Neck: Neck supple. No JVD present. No tracheal deviation present. No thyromegaly present.  Cardiovascular: Normal rate and regular rhythm. Exam reveals no gallop and no friction rub.  No murmur heard.  Pulmonary/Chest: Effort normal and breath sounds normal. No respiratory distress. He has no wheezes. He has no rales. He exhibits no tenderness.  Abdominal: Soft. Bowel sounds are normal. He exhibits no distension. There is no tenderness. There is no rebound and no guarding.  Musculoskeletal:  Left leg surgery sites intact. Limb appropriately tender.  Lymphadenopathy:  He has no cervical adenopathy.  Neurological: He is alert when he chooses to be. He  displays normal reflexes. No cranial nerve deficit. Coordination normal.  Patient was able to provide his name, age and place.  He followed simple commands. UE are 5/5. He is unable to lift leg off chair rest. Moves toes of left foot. RLE is grossly 4- to 4/5. Stocking glove sensory loss in the feet  Skin: He is not diaphoretic.  Left Leg incisions are dressed. onchomycotic toe nails Psych: affect very flat.    Assessment/Plan: 1. Functional deficits secondary to right distal tibia shaft fx, right distal fibula fx which require 3+ hours per day of interdisciplinary therapy in a comprehensive inpatient rehab setting. Physiatrist is providing close team supervision and 24 hour management of active medical problems listed below. Physiatrist and rehab team continue to assess barriers to discharge/monitor patient progress toward functional and medical goals.    FIM: FIM - Bathing Bathing Steps Patient Completed: Chest;Right Arm;Left Arm;Abdomen Bathing:  (for UB bathing, no LB bathing completed)  FIM - Upper Body Dressing/Undressing Upper body dressing/undressing steps patient completed: Thread/unthread right sleeve of pullover shirt/dresss;Thread/unthread left sleeve of pullover shirt/dress;Put head through opening of pull over shirt/dress;Pull shirt over trunk Upper body dressing/undressing: 5: Supervision: Safety issues/verbal cues FIM - Lower Body Dressing/Undressing Lower body dressing/undressing: 0: Activity did not occur  FIM - Toileting Toileting: 0: Activity did not occur  FIM - Diplomatic Services operational officer Devices: Bedside commode;Elevated toilet seat;Grab bars Toilet Transfers: 3-To toilet/BSC: Mod A (lift or lower assist);3-From toilet/BSC: Mod A (lift or lower assist)  FIM - Banker Devices: Arm rests Bed/Chair Transfer: 4: Supine > Sit: Min A (steadying Pt. > 75%/lift 1 leg);3: Bed > Chair or W/C: Mod A (lift or lower  assist);3: Chair or W/C > Bed: Mod A (lift or lower assist)  FIM - Locomotion: Wheelchair Distance: 200 Locomotion: Wheelchair: 5: Travels 150 ft or more: maneuvers on rugs and over door sills with supervision, cueing or coaxing FIM - Locomotion: Ambulation Locomotion: Ambulation Assistive Devices: Forensic scientist: Ambulation: 0: Activity did not occur (pt unable due to weakness)  Comprehension Comprehension Mode: Auditory Comprehension: 1-Understands basic less than 25% of the time/requires cueing 75% of the time  Expression Expression Mode: Verbal Expression: 1-Expresses basis less than 25% of the time/requires cueing greater than 75% of the time.  Social Interaction Social Interaction: 1-Interacts appropriately less than 25% of the time. May be withdrawn or combative.  Problem Solving Problem Solving: 2-Solves basic 25 - 49% of the time - needs direction more than half the time to initiate, plan or complete simple activities  Memory Memory: 4-Recognizes or recalls 75 - 89% of the time/requires cueing 10 - 24% of the time  Medical Problem List and Plan:  1. Distal shaft tibia fracture, distal fibula fracture. Status post intramedullary nail, ORIF 09/09/2012  2. DVT Prophylaxis/Anticoagulation: Coumadin for DVT prophylaxis. Continue subcutaneous heparin until INR greater than 2.00. Monitor for any signs of bleeding. Doppler pending today 3. Pain Management: Hydrocodone as needed.--decreased frequency 4. Neuropsych: This patient is capable of making decisions on his/her own behalf.  -he is not at baseline cognitively per wife. ?med effect---dc'ed hydrocodone  -I suspect a large behavioral component here as well  -seemed to participate a little better with therapy yesterday 5. Diabetes mellitus with peripheral neuropathy. Hemoglobin A1c 7.8. Currently with sliding scale insulin. Check blood sugars a.c. and at bedtime. Sugars remain elevated. Only on SSI. Will add  scheduled lantus insulin 10 units qhs 6. End-stage renal disease. Continue hemodialysis as directed per original services.  7. Hypertension. Norvasc 10 mg daily, labetalol 300 mg twice a day. 8. Chronic anemia. Continue Nulecit weekly with dialysis. Followup CBC with dialysis. Latest hemoglobin 9.7  9. Diastolic congestive heart failure. Following I's and O's, weights.  LOS (Days) 5 A FACE TO FACE EVALUATION WAS PERFORMED  SWARTZ,ZACHARY T 09/17/2012 7:40 AM

## 2012-09-18 ENCOUNTER — Inpatient Hospital Stay (HOSPITAL_COMMUNITY): Payer: Medicare Other | Admitting: *Deleted

## 2012-09-18 LAB — CBC
HCT: 28.8 % — ABNORMAL LOW (ref 39.0–52.0)
Hemoglobin: 10 g/dL — ABNORMAL LOW (ref 13.0–17.0)
MCH: 28.2 pg (ref 26.0–34.0)
MCV: 81.1 fL (ref 78.0–100.0)
RBC: 3.55 MIL/uL — ABNORMAL LOW (ref 4.22–5.81)
WBC: 8.6 10*3/uL (ref 4.0–10.5)

## 2012-09-18 LAB — RENAL FUNCTION PANEL
BUN: 61 mg/dL — ABNORMAL HIGH (ref 6–23)
CO2: 23 mEq/L (ref 19–32)
Calcium: 9.8 mg/dL (ref 8.4–10.5)
Chloride: 86 mEq/L — ABNORMAL LOW (ref 96–112)
Creatinine, Ser: 12.11 mg/dL — ABNORMAL HIGH (ref 0.50–1.35)
GFR calc non Af Amer: 4 mL/min — ABNORMAL LOW (ref >90)
Glucose, Bld: 174 mg/dL — ABNORMAL HIGH (ref 70–99)

## 2012-09-18 LAB — PROTIME-INR: Prothrombin Time: 28.7 seconds — ABNORMAL HIGH (ref 11.6–15.2)

## 2012-09-18 LAB — GLUCOSE, CAPILLARY: Glucose-Capillary: 205 mg/dL — ABNORMAL HIGH (ref 70–99)

## 2012-09-18 MED ORDER — DOXERCALCIFEROL 4 MCG/2ML IV SOLN
INTRAVENOUS | Status: AC
  Filled 2012-09-18: qty 4

## 2012-09-18 MED ORDER — WARFARIN SODIUM 2.5 MG PO TABS
2.5000 mg | ORAL_TABLET | Freq: Every day | ORAL | Status: DC
  Administered 2012-09-18 – 2012-09-21 (×3): 2.5 mg via ORAL
  Filled 2012-09-18 (×5): qty 1

## 2012-09-18 NOTE — Procedures (Signed)
[facility]  On HD via L UA AVF BP--139/85  Goal--2.3 L (weight pre HD today 96.1 and post HD 93.7 kg) Hgb--10,  Fe/TIBC 12 % sat, ferritin 306.  Currently receiving IV Fe through 27 Feb. K+--4.5

## 2012-09-18 NOTE — Progress Notes (Signed)
Physical Therapy Session Note  Patient Details  Name: Oscar Castillo MRN: [id number] Date of Birth: [birth year]  Today's Date: 09/18/2012 Time: 1115-1200 Time Calculation (min): 45 min    Skilled Therapeutic Interventions/Progress Updates:  Therapeutic exercises : In supine - SLR 2 x15 , Hip abd 2x15, Heel slides 2x 15 , Knee extensions 2 x15, In sitting, LAQ 2x15 Therapeutic activities; supine to sit EOB training with mod A and cues for safety, sittin g EOb with supervision, scooting back and forward and side to side. Sit to stand training with RW and mod/max A . Static standing with RW 5 x with modA, patient is able to maintain standing position no longer than 45 s, NWB on L side. Transfer to w/c with modA stand pivot. Patient able to jump two times to reposition himself closer to the w/c. W/C mobility training, using B UE, on tiles and on carpet, focusing on avoiding obstacles and maneuvering in small spaces.Patient able to propel himself for over 150 feet with less than 25 % verbal cues. Patient reported his cast is too tight, nursing informed.   Therapy Documentation Precautions:  Precautions Precautions: Fall Restrictions Weight Bearing Restrictions: Yes LLE Weight Bearing: Non weight bearing Vital Signs: Therapy Vitals Temp: 98.2 F (36.8 C) Temp src: Oral Pulse Rate: 88 Resp: 19 BP: 146/82 mmHg Patient Position, if appropriate: Lying Oxygen Therapy SpO2: 99 % O2 Device: None (Room air) Pain: Pain Assessment Pain Assessment: 0-10 Pain Score:   7 Pain Type: Acute pain Pain Location: Leg Pain Orientation: Left Pain Descriptors: Sore Pain Onset: Gradual Pain Intervention(s): Medication (See eMAR)  See FIM for current functional status  Therapy/Group: Individual Therapy  Dorna Mai 09/18/2012, 12:29 PM

## 2012-09-18 NOTE — Progress Notes (Signed)
ANTICOAGULATION CONSULT NOTE - Follow Up Consult  Pharmacy Consult for Coumadin Indication: VTE prophylaxis  No Known Allergies  Patient Measurements: Weight: 206 lb 9.1 oz (93.7 kg) Heparin Dosing Weight:   Vital Signs: Temp: 98.2 F (36.8 C) (02/23 0900) Temp src: Oral (02/23 0900) BP: 146/82 mmHg (02/23 0900) Pulse Rate: 88 (02/23 0900)  Labs:  Recent Labs  09/15/12 1400 09/16/12 0610 09/17/12 0725 09/18/12 0459 09/18/12 0500  HGB 9.6*  --   --  10.0*  --   HCT 28.4*  --   --  28.8*  --   PLT 350  --   --  467*  --   LABPROT  --  32.5* 28.0* 28.7*  --   INR  --  3.41* 2.79* 2.88*  --   CREATININE 11.82*  --   --   --  12.11*    The CrCl is unknown because both a height and weight (above a minimum accepted value) are required for this calculation.   Medications:  Scheduled:  . brimonidine  1 drop Both Eyes BID  . brinzolamide  1 drop Both Eyes BID  . calcium acetate  1,334 mg Oral TID WC  . darbepoetin (ARANESP) injection - DIALYSIS  12.5 mcg Intravenous Q Tue-HD  . docusate sodium  100 mg Oral BID  . doxercalciferol  6 mcg Intravenous Q T,Th,Sa-HD  . ferric gluconate (FERRLECIT/NULECIT) IV  125 mg Intravenous Q T,Th,Sa-HD  . insulin aspart  0-9 Units Subcutaneous TID WC  . insulin glargine  10 Units Subcutaneous QHS  . pantoprazole  40 mg Oral Daily  . patient's guide to using coumadin book   Does not apply Once  . polyethylene glycol  68 g Oral Daily  . Travoprost (BAK Free)  1 drop Both Eyes QHS  . [COMPLETED] warfarin  5 mg Oral ONCE-1800  . Warfarin - Pharmacist Dosing Inpatient   Does not apply q1800  . [DISCONTINUED] labetalol  200 mg Oral BID   Assessment: 62 yo M s/p L tibial fracture and ORIF 09/09/12  Anticoagulation: Coumadin for VTE px; INR down to 2.88. No bleeding noted. Patient very sensitive to Coumadin  Lytes: Na low 126.  Renal: Scr 12.11 with ESRD. Phos elevated at 6.5. Meds: PhosLo, Aranesp q Tuesday, Hetorol, Nulecit,  Endo:  CBGs 158-205 on SSI and Lantus  Goal of Therapy:  INR 2-3   Plan:  1) Try Coumadin 2.5mg  daily for maintenance.  Merilynn Finland, [employer] 09/18/2012,10:32 AM

## 2012-09-19 ENCOUNTER — Inpatient Hospital Stay (HOSPITAL_COMMUNITY): Payer: Medicare Other | Admitting: Occupational Therapy

## 2012-09-19 ENCOUNTER — Inpatient Hospital Stay (HOSPITAL_COMMUNITY): Payer: Medicare Other | Admitting: Physical Therapy

## 2012-09-19 DIAGNOSIS — E1165 Type 2 diabetes mellitus with hyperglycemia: Secondary | ICD-10-CM

## 2012-09-19 DIAGNOSIS — E1142 Type 2 diabetes mellitus with diabetic polyneuropathy: Secondary | ICD-10-CM

## 2012-09-19 DIAGNOSIS — N186 End stage renal disease: Secondary | ICD-10-CM

## 2012-09-19 DIAGNOSIS — S8263XA Displaced fracture of lateral malleolus of unspecified fibula, initial encounter for closed fracture: Secondary | ICD-10-CM

## 2012-09-19 DIAGNOSIS — W19XXXA Unspecified fall, initial encounter: Secondary | ICD-10-CM

## 2012-09-19 DIAGNOSIS — S82209A Unspecified fracture of shaft of unspecified tibia, initial encounter for closed fracture: Secondary | ICD-10-CM

## 2012-09-19 LAB — GLUCOSE, CAPILLARY
Glucose-Capillary: 162 mg/dL — ABNORMAL HIGH (ref 70–99)
Glucose-Capillary: 197 mg/dL — ABNORMAL HIGH (ref 70–99)
Glucose-Capillary: 136 mg/dL — ABNORMAL HIGH (ref 70–99)
Glucose-Capillary: 135 mg/dL — ABNORMAL HIGH (ref 70–99)

## 2012-09-19 LAB — PROTIME-INR
INR: 2.96 — ABNORMAL HIGH (ref 0.00–1.49)
Prothrombin Time: 29.3 seconds — ABNORMAL HIGH (ref 11.6–15.2)

## 2012-09-19 MED ORDER — INSULIN GLARGINE 100 UNIT/ML ~~LOC~~ SOLN
15.0000 [IU] | Freq: Every day | SUBCUTANEOUS | Status: DC
  Administered 2012-09-19 – 2012-09-22 (×4): 15 [IU] via SUBCUTANEOUS

## 2012-09-19 NOTE — Progress Notes (Signed)
Physical Therapy Note  Patient Details  Name: Oscar Castillo MRN: [id number] Date of Birth: March 30, 1951 Today's Date: 09/19/2012  1130-1155 (25 minutes) individual Pain: 8/10 LT LE/ premedicated Focus of treatment: transfer training; sit >< supine (mat); wc mobility/activity tolerance  Treatment: Transfers - pt required vcs for locking brakes, instructional cues to wing away RT legrest; sit to stand from  wc min assist to RW with max vcs for hand placement ; sit to stand from mat to RW min assist with pt. Hyperextending RT knee and assist to correct posterior loss of balance. Pt able to transfer to wc with RW min assist and maintain NWB LT LE once in standing. ; wc mobility -120 feet SBA with increased time.  Viviene Thurston,JIM 09/19/2012, 11:53 AM

## 2012-09-19 NOTE — Progress Notes (Signed)
Physical Therapy Session Note  Patient Details  Name: [name] MRN: [id number] Date of Birth: Apr 16, 1951  Today's Date: 09/19/2012 Time: 1000-1058 Time Calculation (min): 58 min  Short Term Goals: Week 1:  PT Short Term Goal 1 (Week 1): Pt will be able to transfer with min A squat pivot PT Short Term Goal 2 (Week 1): Pt will be able to to sit to stand with min A PT Short Term Goal 3 (Week 1): Pt will be able to gait with RW x 10' with mod A PT Short Term Goal 4 (Week 1): Pt will be able to demonstrate dynamic standing balance during functional task with min A  Skilled Therapeutic Interventions/Progress Updates:    Wheelchair mobility to/from gym for strength and conditioning, pt only needs 2 rest breaks Pt continues to improve steering. Pt with only min verbal cues needed for wheelchair negotiation.    Ambulation (hop) x 21', 15' with RW maintaining NWB status of Lt. LE, with min assist and close follow with wheelchair. Sit <> stands from wheelchair performed with min assist throughout treatment.   Stand pivot and squat pivot transfers practiced wheelchair <> mat x 3 reps with min/mod assist. Pt unable to recall management of leg rests with any transfers however was able to remember brakes appropriately today. Pt positioned with Lt. Leg elevated, applied velcro strip around pillow to keep from falling off leg rest. Safety belt applied. Pt continues to have decreased safety awareness and will need 24 hour supervision upon return home.   Therapy Documentation Precautions:  Precautions Precautions: Fall Restrictions Weight Bearing Restrictions: Yes LLE Weight Bearing: Non weight bearing Pain: Pain Assessment Pain Assessment: 0-10 Pain Score:   9 Pain Type: Acute pain Pain Location: Leg Pain Orientation: Left Pain Descriptors: Aching;Sore;Heaviness Pain Frequency: Occasional Pain Onset: On-going    See FIM for current functional status  Therapy/Group: Individual  Therapy  Wilhemina Bonito 09/19/2012, 12:07 PM

## 2012-09-19 NOTE — Progress Notes (Signed)
Occupational Therapy Session Note  Patient Details  Name: Oscar Castillo MRN: [id number] Date of Birth: 05-01-1951  Today's Date: 09/19/2012 Time: 4098-1191 Time Calculation (min): 45 min  Short Term Goals: Week 1:  OT Short Term Goal 1 (Week 1): Pt will perform LB bathing sit to stand with mod assist. OT Short Term Goal 2 (Week 1): Pt will perform LB dressing sit to stand with mod assist. OT Short Term Goal 3 (Week 1): Pt will perform stand pivot or scoot pivot transfers to bedside toilet with mod assist. OT Short Term Goal 4 (Week 1): Pt will demonstrate anticipatory awareness by stating that he needs assistance with all of his transfers and sit to stand when questioned by therapist. OT Short Term Goal 5 (Week 1): Pt will transfer from supine to sit EOB with no more than min assist in preparation for selfcare tasks.    Skilled Therapeutic Interventions/Progress Updates:  Engaged in bed mobility, squat/scoot transfer bed>w/c>3in1 over commode then stand to simulate clothing management using grab bar for support then pivot into w/c, stand at sink to brush hair and beard then stand again to brush teeth.  Patient requested back to bed then stated I do this a little different and he stood then put both hands of bed then twisted hips around to land on the bed.  This technique was not pretty however it worked.  Focused session on activity tolerance, safe transfers and NWB through LLE with all functional mobility which patient was able to do.  Therapy Documentation Precautions:  Precautions Precautions: Fall Restrictions Weight Bearing Restrictions: Yes LLE Weight Bearing: Non weight bearing Pain: 9/10 LLE pain, rest, repositioned, adapt session to accommodate pain. RN aware and medication provided at end of session. ADL: See FIM for current functional status  Therapy/Group: Individual Therapy  Leiani Enright 09/19/2012, 5:07 PM

## 2012-09-19 NOTE — Progress Notes (Signed)
Subjective/Complaints: Feeling well. Had a good day yesterday. Ready for therapy today. Leg pain at times. A 12 point review of systems has been performed and if not noted above is otherwise negative.   Objective: Vital Signs: Blood pressure 145/82, pulse 87, temperature 98.6 F (37 C), temperature source Oral, resp. rate 16, weight 95.1 kg (209 lb 10.5 oz), SpO2 100.00%. No results found.  Recent Labs  09/18/12 0459  WBC 8.6  HGB 10.0*  HCT 28.8*  PLT 467*    Recent Labs  09/18/12 0500  NA 126*  K 4.5  CL 86*  GLUCOSE 174*  BUN 61*  CREATININE 12.11*  CALCIUM 9.8   CBG (last 3)   Recent Labs  09/18/12 2128 09/19/12 0614 09/19/12 0717  GLUCAP 205* 152* 162*    Wt Readings from Last 3 Encounters:  09/19/12 95.1 kg (209 lb 10.5 oz)  09/12/12 101.3 kg (223 lb 5.2 oz)  09/12/12 101.3 kg (223 lb 5.2 oz)    Physical Exam:  Constitutional: He appears well-developed and well-nourished. No distress. Slow to arouse HENT:  Head: Normocephalic and atraumatic.  Right Ear: External ear normal.  Left Ear: External ear normal.  Nose: Nose normal.  Mouth/Throat: Oropharynx is clear and moist.  Eyes: Conjunctivae and EOM are normal. Right eye exhibits no discharge. Left eye exhibits no discharge. No scleral icterus.  Pupils round and reactive to light  Neck: Neck supple. No JVD present. No tracheal deviation present. No thyromegaly present.  Cardiovascular: Normal rate and regular rhythm. Exam reveals no gallop and no friction rub.  No murmur heard.  Pulmonary/Chest: Effort normal and breath sounds normal. No respiratory distress. He has no wheezes. He has no rales. He exhibits no tenderness.  Abdominal: Soft. Bowel sounds are normal. He exhibits no distension. There is no tenderness. There is no rebound and no guarding.  Musculoskeletal:  Left leg surgery sites intact. Limb appropriately tender.  Lymphadenopathy:  He has no cervical adenopathy.  Neurological: He is  alert when he chooses to be. He displays normal reflexes. No cranial nerve deficit. Coordination normal.  Patient was able to provide his name, age and place.  He followed simple commands. UE are 5/5. He is unable to lift leg off chair rest. Moves toes of left foot. RLE is grossly 4- to 4/5. Stocking glove sensory loss in the feet  Skin: He is not diaphoretic.  Left Leg incisions are dressed. onchomycotic toe nails Psych: affect very flat.    Assessment/Plan: 1. Functional deficits secondary to right distal tibia shaft fx, right distal fibula fx which require 3+ hours per day of interdisciplinary therapy in a comprehensive inpatient rehab setting. Physiatrist is providing close team supervision and 24 hour management of active medical problems listed below. Physiatrist and rehab team continue to assess barriers to discharge/monitor patient progress toward functional and medical goals.    FIM: FIM - Bathing Bathing Steps Patient Completed: Chest;Right Arm;Left Arm;Abdomen Bathing:  (for UB bathing, no LB bathing completed)  FIM - Upper Body Dressing/Undressing Upper body dressing/undressing steps patient completed: Thread/unthread right sleeve of pullover shirt/dresss;Thread/unthread left sleeve of pullover shirt/dress;Put head through opening of pull over shirt/dress;Pull shirt over trunk Upper body dressing/undressing: 5: Supervision: Safety issues/verbal cues FIM - Lower Body Dressing/Undressing Lower body dressing/undressing: 1: Total-Patient completed less than 25% of tasks  FIM - Toileting Toileting: 0: Activity did not occur  FIM - Diplomatic Services operational officer Devices: Bedside commode;Elevated toilet seat;Grab bars Toilet Transfers: 0-Activity did not occur  FIM - Banker Devices: Therapist, occupational: 3: Bed > Chair or W/C: Mod A (lift or lower assist);3: Chair or W/C > Bed: Mod A (lift or lower assist)  FIM -  Locomotion: Wheelchair Distance: 200 Locomotion: Wheelchair: 5: Travels 150 ft or more: maneuvers on rugs and over door sills with supervision, cueing or coaxing FIM - Locomotion: Ambulation Locomotion: Ambulation Assistive Devices: Designer, industrial/product Ambulation/Gait Assistance:  (unable to perform secondary to pain and weakness) Locomotion: Ambulation: 0: Activity did not occur  Comprehension Comprehension Mode: Auditory Comprehension: 3-Understands basic 50 - 74% of the time/requires cueing 25 - 50%  of the time  Expression Expression Mode: Verbal Expression: 5-Expresses basic needs/ideas: With extra time/assistive device  Social Interaction Social Interaction: 2-Interacts appropriately 25 - 49% of time - Needs frequent redirection.  Problem Solving Problem Solving: 3-Solves basic 50 - 74% of the time/requires cueing 25 - 49% of the time  Memory Memory: 3-Recognizes or recalls 50 - 74% of the time/requires cueing 25 - 49% of the time  Medical Problem List and Plan:  1. Distal shaft tibia fracture, distal fibula fracture. Status post intramedullary nail, ORIF 09/09/2012  2. DVT Prophylaxis/Anticoagulation: Coumadin for DVT prophylaxis. Continue subcutaneous heparin until INR greater than 2.00. Monitor for any signs of bleeding. Doppler pending today 3. Pain Management: Hydrocodone as needed.--decreased frequency 4. Neuropsych: This patient is capable of making decisions on his/her own behalf.  -mentation improving?  -appears much more up beat and lucid today 5. Diabetes mellitus with peripheral neuropathy. Hemoglobin A1c 7.8. Currently with sliding scale insulin. Check blood sugars a.c. and at bedtime. Sugars remain elevated. Only on SSI. Will increase scheduled lantus insulin to 15 units qhs 6. End-stage renal disease. Continue hemodialysis as directed per original services.  7. Hypertension. Norvasc 10 mg daily, labetalol 300 mg twice a day. 8. Chronic anemia. Continue Nulecit  weekly with dialysis. Followup CBC with dialysis. Latest hemoglobin 9.7  9. Diastolic congestive heart failure. Following I's and O's, weights.  LOS (Days) 7 A FACE TO FACE EVALUATION WAS PERFORMED  Nasir Bright T 09/19/2012 7:57 AM

## 2012-09-19 NOTE — Progress Notes (Signed)
Occupational Therapy Session Note  Patient Details  Name: Oscar Castillo MRN: [id number] Date of Birth: 04-04-1951  Today's Date: 09/19/2012 Time: 7829-5621 Time Calculation (min): 55 min  Short Term Goals: Week 1:  OT Short Term Goal 1 (Week 1): Pt will perform LB bathing sit to stand with mod assist. OT Short Term Goal 2 (Week 1): Pt will perform LB dressing sit to stand with mod assist. OT Short Term Goal 3 (Week 1): Pt will perform stand pivot or scoot pivot transfers to bedside toilet with mod assist. OT Short Term Goal 4 (Week 1): Pt will demonstrate anticipatory awareness by stating that he needs assistance with all of his transfers and sit to stand when questioned by therapist. OT Short Term Goal 5 (Week 1): Pt will transfer from supine to sit EOB with no more than min assist in preparation for selfcare tasks.    Skilled Therapeutic Interventions/Progress Updates:  At beginning of session, patient with 9/10 pain in LLE - RN aware and gave medication At end of session, patient with 7/10 pain in LLE Patient found supine in bed upon entering room. Patient transferred supine -> sit with min verbal cues and supervision and then edge of bed -> w/c stand pivot transfer with mod verbal cues and minimal assistance. Patient then sat at sink in w/c for UB/LB bathing & dressing. Therapist donned TED hose -> RLE and after ADL patient completed 3 sets of 7 w/c pushups. Focused skilled intervention on initiation throughout tasks, problem solving through tasks, completion of tasks, UB/LB bathing & dressing, sit<->stands, dynamic standing balance/tolerance/endurance, UE strengthening, and overall activity tolerance/endurance. At end of session left patient seated in w/c with quick release belt donned and call bell & phone within reach.   Precautions:  Precautions Precautions: Fall Restrictions Weight Bearing Restrictions: Yes LLE Weight Bearing: Non weight bearing  See FIM for current functional  status  Therapy/Group: Individual Therapy  Joscelyne Renville 09/19/2012, 9:31 AM

## 2012-09-19 NOTE — Progress Notes (Signed)
ANTICOAGULATION CONSULT NOTE - Follow Up Consult  Pharmacy Consult for coumadin Indication: VTE prophylaxis  No Known Allergies  Patient Measurements: Weight: 209 lb 10.5 oz (95.1 kg) Heparin Dosing Weight:   Vital Signs: Temp: 98.6 F (37 C) (02/24 0553) Temp src: Oral (02/24 0553) BP: 145/82 mmHg (02/24 0553) Pulse Rate: 87 (02/24 0553)  Labs:  Recent Labs  09/17/12 0725 09/18/12 0459 09/18/12 0500 09/19/12 0615  HGB  --  10.0*  --   --   HCT  --  28.8*  --   --   PLT  --  467*  --   --   LABPROT 28.0* 28.7*  --  29.3*  INR 2.79* 2.88*  --  2.96*  CREATININE  --   --  12.11*  --     The CrCl is unknown because both a height and weight (above a minimum accepted value) are required for this calculation.   Medications:  Scheduled:  . brimonidine  1 drop Both Eyes BID  . brinzolamide  1 drop Both Eyes BID  . calcium acetate  1,334 mg Oral TID WC  . darbepoetin (ARANESP) injection - DIALYSIS  12.5 mcg Intravenous Q Tue-HD  . docusate sodium  100 mg Oral BID  . doxercalciferol  6 mcg Intravenous Q T,Th,Sa-HD  . ferric gluconate (FERRLECIT/NULECIT) IV  125 mg Intravenous Q T,Th,Sa-HD  . insulin aspart  0-9 Units Subcutaneous TID WC  . insulin glargine  15 Units Subcutaneous QHS  . pantoprazole  40 mg Oral Daily  . patient's guide to using coumadin book   Does not apply Once  . polyethylene glycol  68 g Oral Daily  . Travoprost (BAK Free)  1 drop Both Eyes QHS  . warfarin  2.5 mg Oral q1800  . Warfarin - Pharmacist Dosing Inpatient   Does not apply q1800  . [DISCONTINUED] insulin glargine  10 Units Subcutaneous QHS   Infusions:    Assessment: 62 yo male is currently on therapeutic coumadin for VTE prophylaxis s/p ORIF.  INR today is 2.96 Goal of Therapy:  INR 2-3    Plan:  1) Continue coumadin 2.5mg  po qday. 2) INR in am.  Laticia Vannostrand, Tsz-Yin 09/19/2012,8:11 AM

## 2012-09-19 NOTE — Progress Notes (Signed)
Subjective:  Completed therapy this morning, no current complaints.  Objective: Vital signs in last 24 hours: Temp:  [98.6 F (37 C)-99.5 F (37.5 C)] 98.6 F (37 C) (02/24 0553) Pulse Rate:  [87-88] 87 (02/24 0553) Resp:  [16-19] 16 (02/24 0553) BP: (126-145)/(82) 145/82 mmHg (02/24 0553) SpO2:  [99 %-100 %] 100 % (02/24 0553) Weight:  [95.1 kg (209 lb 10.5 oz)] 95.1 kg (209 lb 10.5 oz) (02/24 0553) Weight change: -2.4 kg (-5 lb 4.7 oz)  Intake/Output from previous day: 02/23 0701 - 02/24 0700 In: 600 [P.O.:600] Out: 1503    EXAM: General appearance:  Alert, in no apparent distress Resp:  CTA without rales, rhonchi, or wheezes Cardio:  RRR without murmur or rub GI:  + BS, soft and nontender Extremities:  No edema, left leg dressing Access:  AVF @ LUA with + bruit, right IJ catheter  Lab Results:  Recent Labs  09/18/12 0459  WBC 8.6  HGB 10.0*  HCT 28.8*  PLT 467*   BMET:  Recent Labs  09/18/12 0500  NA 126*  K 4.5  CL 86*  CO2 23  GLUCOSE 174*  BUN 61*  CREATININE 12.11*  CALCIUM 9.8  ALBUMIN 3.0*   No results found for this basename: PTH,  in the last 72 hours Iron Studies: No results found for this basename: IRON, TIBC, TRANSFERRIN, FERRITIN,  in the last 72 hours  Dialysis Orders: Center: [facility] on TTS.  EDW 99 kg HD Bath 2K/2.25Ca Time 4 hrs Heparin 5000 U. Access Right IJ catheter, also L BVT (placed 06/14/12) BFR 400 DFR 800 Hectorol 6 mcg IV/HD Epogen 0 Units IV/HD Venofer 50 mg on Thurs.   Assessment/Plan: 1. Displaced left tibial fx - secondary to fall, ORIF  2/14 per Dr. Dion Saucier, rehab since 2/17. 2. ESRD - HD on TTS @ [facility]; K 4.5 yesterday; used new AVF successfully with 2 needles and BFR 250 on 2/22. HD tomorrow. 3. Hypertension/volume - BP 145/82 most recently, on outpatient Amlodipine 10 mg qd and Labetalol 300 mg bid, currently off both; current wt 95.1 kg with EDW 99 kg.  Lower EDW at discharge. 4. Anemia - Hgb 10, no outpatient Epogen,  Aranesp 12.5 mcg on Tues and IV Fe qHD. 5. Metabolic bone disease - Ca 9.8 (10.7 corrected), P 6.5; Hectorol 6 mcg, Phoslo 2 with meals. 6. Nutrition - Alb 3, renal diet. 7. DM - on insulin per primary. 8. Hx CVA - in 07/2009. 9. Hx nephrolithiasis 10. VTE prophylaxis - on short-term Coumadin per pharmacy.     LOS: 7 days   LYLES,CHARLES 09/19/2012,11:21 AM  Patient seen and examined.  Agree with assessment and plan as above. Vinson Moselle  MD [phone] pgr    [phone] cell 09/19/2012, 6:12 PM

## 2012-09-20 ENCOUNTER — Inpatient Hospital Stay (HOSPITAL_COMMUNITY): Payer: Medicare Other

## 2012-09-20 ENCOUNTER — Inpatient Hospital Stay (HOSPITAL_COMMUNITY): Payer: Medicare Other | Admitting: Occupational Therapy

## 2012-09-20 LAB — CBC
HCT: 33.8 % — ABNORMAL LOW (ref 39.0–52.0)
HCT: 30.1 % — ABNORMAL LOW (ref 39.0–52.0)
Hemoglobin: 10.9 g/dL — ABNORMAL LOW (ref 13.0–17.0)
MCH: 27.1 pg (ref 26.0–34.0)
MCHC: 32.2 g/dL (ref 30.0–36.0)
MCHC: 33.2 g/dL (ref 30.0–36.0)
Platelets: 488 10*3/uL — ABNORMAL HIGH (ref 150–400)
RDW: 15 % (ref 11.5–15.5)
RDW: 14.7 % (ref 11.5–15.5)
WBC: 8.9 10*3/uL (ref 4.0–10.5)

## 2012-09-20 LAB — GLUCOSE, CAPILLARY
Glucose-Capillary: 146 mg/dL — ABNORMAL HIGH (ref 70–99)
Glucose-Capillary: 162 mg/dL — ABNORMAL HIGH (ref 70–99)
Glucose-Capillary: 123 mg/dL — ABNORMAL HIGH (ref 70–99)

## 2012-09-20 LAB — RENAL FUNCTION PANEL
Albumin: 3.4 g/dL — ABNORMAL LOW (ref 3.5–5.2)
BUN: 51 mg/dL — ABNORMAL HIGH (ref 6–23)
Calcium: 10.7 mg/dL — ABNORMAL HIGH (ref 8.4–10.5)
Glucose, Bld: 170 mg/dL — ABNORMAL HIGH (ref 70–99)
Phosphorus: 5.2 mg/dL — ABNORMAL HIGH (ref 2.3–4.6)
Potassium: 5.8 mEq/L — ABNORMAL HIGH (ref 3.5–5.1)

## 2012-09-20 LAB — PROTIME-INR: INR: 2.98 — ABNORMAL HIGH (ref 0.00–1.49)

## 2012-09-20 MED ORDER — LIDOCAINE HCL (PF) 1 % IJ SOLN: 5.0000 mL | INTRAMUSCULAR | Status: DC | PRN

## 2012-09-20 MED ORDER — ACETAMINOPHEN 325 MG PO TABS
ORAL_TABLET | ORAL | Status: AC
  Filled 2012-09-20: qty 2

## 2012-09-20 MED ORDER — PENTAFLUOROPROP-TETRAFLUOROETH EX AERO: 1.0000 "application " | INHALATION_SPRAY | CUTANEOUS | Status: DC | PRN

## 2012-09-20 MED ORDER — DARBEPOETIN ALFA-POLYSORBATE 25 MCG/0.42ML IJ SOLN
INTRAMUSCULAR | Status: AC
  Filled 2012-09-20: qty 0.42

## 2012-09-20 MED ORDER — LIDOCAINE-PRILOCAINE 2.5-2.5 % EX CREA
1.0000 "application " | TOPICAL_CREAM | CUTANEOUS | Status: DC | PRN
  Filled 2012-09-20: qty 5

## 2012-09-20 MED ORDER — HEPARIN SODIUM (PORCINE) 1000 UNIT/ML DIALYSIS: 20.0000 [IU]/kg | INTRAMUSCULAR | Status: DC | PRN

## 2012-09-20 MED ORDER — NEPRO/CARBSTEADY PO LIQD
237.0000 mL | ORAL | Status: DC | PRN
  Filled 2012-09-20: qty 237

## 2012-09-20 MED ORDER — HEPARIN SODIUM (PORCINE) 1000 UNIT/ML DIALYSIS: 1000.0000 [IU] | INTRAMUSCULAR | Status: DC | PRN

## 2012-09-20 MED ORDER — SODIUM CHLORIDE 0.9 % IV SOLN: 100.0000 mL | INTRAVENOUS | Status: DC | PRN

## 2012-09-20 MED ORDER — ALTEPLASE 2 MG IJ SOLR
2.0000 mg | Freq: Once | INTRAMUSCULAR | Status: DC | PRN
  Filled 2012-09-20: qty 2

## 2012-09-20 MED ORDER — DOXERCALCIFEROL 4 MCG/2ML IV SOLN
INTRAVENOUS | Status: AC
  Administered 2012-09-20: 6 ug via INTRAVENOUS
  Filled 2012-09-20: qty 4

## 2012-09-20 NOTE — Progress Notes (Signed)
Occupational Therapy Session Note  Patient Details  Name: Oscar Castillo MRN: [id number] Date of Birth: Oct 28, 1950  Today's Date: 09/20/2012 Time: 1115-1210 Time Calculation (min): 55 min   Skilled Therapeutic Interventions/Progress Updates:   Pt propelled his wheelchair to the gym with supervision and transferred to the UE ergonometer with mod assist.  Performed 3 intervals of UE exercise on level 10-13 resistance, with  RPMs staying between 20 and 25.  Performed 2 intervals of peddling forward and one reverse with rest break of 1-2 mins between each set.  Pt also practiced sit to stand with mod assist and mod instructional cueing for hand placement during sit to stand and to maintain NWBing on the LLE.   Pt still with LOB during transfers and also occasionally putting weight through the LLE.  Took pt back to his room and performed toileting task with mod assist as well for the transfer to the toilet.    Therapy Documentation Precautions:  Precautions Precautions: Fall Restrictions Weight Bearing Restrictions: Yes LLE Weight Bearing: Non weight bearing  Pain: Pain Assessment Pain Assessment: Faces Faces Pain Scale: Hurts a little bit Pain Type: Surgical pain Pain Location: Leg Pain Orientation: Left Pain Intervention(s): Medication (See eMAR);Repositioned ADL: See FIM for current functional status  Therapy/Group: Individual Therapy  Lindzy Rupert OTR/L 09/20/2012, 12:19 PM

## 2012-09-20 NOTE — Progress Notes (Signed)
ANTICOAGULATION CONSULT NOTE - Follow Up Consult  Pharmacy Consult for coumadin Indication: VTE prophylaxis  No Known Allergies  Patient Measurements: Weight: 209 lb 10.5 oz (95.1 kg) Heparin Dosing Weight:   Vital Signs: Temp: 98.2 F (36.8 C) (02/25 0534) Temp src: Oral (02/25 0534) BP: 137/80 mmHg (02/25 0534) Pulse Rate: 77 (02/25 0534)  Labs:  Recent Labs  09/18/12 0459 09/18/12 0500 09/19/12 0615 09/20/12 1033 09/20/12 1043  HGB 10.0*  --   --   --  10.9*  HCT 28.8*  --   --   --  33.8*  PLT 467*  --   --   --  497*  LABPROT 28.7*  --  29.3* 29.4*  --   INR 2.88*  --  2.96* 2.98*  --   CREATININE  --  12.11*  --   --  11.27*    The CrCl is unknown because both a height and weight (above a minimum accepted value) are required for this calculation.   Medications:  Scheduled:  . brimonidine  1 drop Both Eyes BID  . brinzolamide  1 drop Both Eyes BID  . calcium acetate  1,334 mg Oral TID WC  . darbepoetin (ARANESP) injection - DIALYSIS  12.5 mcg Intravenous Q Tue-HD  . docusate sodium  100 mg Oral BID  . doxercalciferol  6 mcg Intravenous Q T,Th,Sa-HD  . ferric gluconate (FERRLECIT/NULECIT) IV  125 mg Intravenous Q T,Th,Sa-HD  . insulin aspart  0-9 Units Subcutaneous TID WC  . insulin glargine  15 Units Subcutaneous QHS  . pantoprazole  40 mg Oral Daily  . patient's guide to using coumadin book   Does not apply Once  . polyethylene glycol  68 g Oral Daily  . Travoprost (BAK Free)  1 drop Both Eyes QHS  . warfarin  2.5 mg Oral q1800  . Warfarin - Pharmacist Dosing Inpatient   Does not apply q1800   Infusions:    Assessment: 62 yo male s/p ORIF is currently on therapeutic coumadin.  INR is stable at 2.98 (may still have some effect from the 5mg  of coumadin he got on 09/17/12) Goal of Therapy:  INR 2-3    Plan:  1) Continue coumadin 2.5mg  po qday 2) INR in am.  Haruna Rohlfs, Tsz-Yin 09/20/2012,11:50 AM

## 2012-09-20 NOTE — Progress Notes (Signed)
Subjective/Complaints: Feeling well. Left ankle a little sore. Slept well. A 12 point review of systems has been performed and if not noted above is otherwise negative.   Objective: Vital Signs: Blood pressure 137/80, pulse 77, temperature 98.2 F (36.8 C), temperature source Oral, resp. rate 16, weight 95.1 kg (209 lb 10.5 oz), SpO2 100.00%. No results found.  Recent Labs  09/18/12 0459  WBC 8.6  HGB 10.0*  HCT 28.8*  PLT 467*    Recent Labs  09/18/12 0500  NA 126*  K 4.5  CL 86*  GLUCOSE 174*  BUN 61*  CREATININE 12.11*  CALCIUM 9.8   CBG (last 3)   Recent Labs  09/19/12 1639 09/19/12 2104 09/20/12 0711  GLUCAP 136* 135* 146*    Wt Readings from Last 3 Encounters:  09/19/12 95.1 kg (209 lb 10.5 oz)  09/12/12 101.3 kg (223 lb 5.2 oz)  09/12/12 101.3 kg (223 lb 5.2 oz)    Physical Exam:  Constitutional: He appears well-developed and well-nourished. No distress. Slow to arouse HENT:  Head: Normocephalic and atraumatic.  Right Ear: External ear normal.  Left Ear: External ear normal.  Nose: Nose normal.  Mouth/Throat: Oropharynx is clear and moist.  Eyes: Conjunctivae and EOM are normal. Right eye exhibits no discharge. Left eye exhibits no discharge. No scleral icterus.  Pupils round and reactive to light  Neck: Neck supple. No JVD present. No tracheal deviation present. No thyromegaly present.  Cardiovascular: Normal rate and regular rhythm. Exam reveals no gallop and no friction rub.  No murmur heard.  Pulmonary/Chest: Effort normal and breath sounds normal. No respiratory distress. He has no wheezes. He has no rales. He exhibits no tenderness.  Abdominal: Soft. Bowel sounds are normal. He exhibits no distension. There is no tenderness. There is no rebound and no guarding.  Musculoskeletal:  Left leg surgery sites intact. Limb appropriately tender.  Lymphadenopathy:  He has no cervical adenopathy.  Neurological:  He displays normal reflexes. No  cranial nerve deficit. Coordination normal.  Alert and appropriate. Better insight and awareness. UE are 5/5. He is unable to lift leg off chair rest. Moves toes of left foot. RLE is grossly 4- to 4/5. Stocking glove sensory loss in the feet  Skin: He is not diaphoretic.  Left Leg incisions are dressed. onchomycotic toe nails Psych: affect more dynamic    Assessment/Plan: 1. Functional deficits secondary to right distal tibia shaft fx, right distal fibula fx which require 3+ hours per day of interdisciplinary therapy in a comprehensive inpatient rehab setting. Physiatrist is providing close team supervision and 24 hour management of active medical problems listed below. Physiatrist and rehab team continue to assess barriers to discharge/monitor patient progress toward functional and medical goals.  Pt seems to be displaying better participation and performance with therapy. Will discuss with the team today whether he's advanced enough where we could entertain mod I goals   FIM: FIM - Bathing Bathing Steps Patient Completed: Chest;Right Arm;Left Arm;Abdomen;Front perineal area;Buttocks;Right upper leg;Left upper leg;Right lower leg (including foot) Bathing: 4: Steadying assist  FIM - Upper Body Dressing/Undressing Upper body dressing/undressing steps patient completed: Thread/unthread right sleeve of pullover shirt/dresss;Thread/unthread left sleeve of pullover shirt/dress;Put head through opening of pull over shirt/dress;Pull shirt over trunk Upper body dressing/undressing: 5: Supervision: Safety issues/verbal cues FIM - Lower Body Dressing/Undressing Lower body dressing/undressing steps patient completed: Thread/unthread right pants leg;Pull pants up/down;Don/Doff right shoe;Fasten/unfasten right shoe Lower body dressing/undressing: 4: Min-Patient completed 75 plus % of tasks  FIM -  Toileting Toileting: 0: Activity did not occur  FIM - Diplomatic Services operational officer  Devices: Elevated toilet seat;Grab bars Toilet Transfers: 4-To toilet/BSC: Min A (steadying Pt. > 75%);4-From toilet/BSC: Min A (steadying Pt. > 75%)  FIM - Bed/Chair Transfer Bed/Chair Transfer Assistive Devices: Arm rests Bed/Chair Transfer: 5: Supine > Sit: Supervision (verbal cues/safety issues);4: Sit > Supine: Min A (steadying pt. > 75%/lift 1 leg);4: Bed > Chair or W/C: Min A (steadying Pt. > 75%);4: Chair or W/C > Bed: Min A (steadying Pt. > 75%)  FIM - Locomotion: Wheelchair Distance: 200 Locomotion: Wheelchair: 5: Travels 150 ft or more: maneuvers on rugs and over door sills with supervision, cueing or coaxing FIM - Locomotion: Ambulation Locomotion: Ambulation Assistive Devices: Designer, industrial/product Ambulation/Gait Assistance: 4: Min assist Locomotion: Ambulation: 1: Travels less than 50 ft with minimal assistance (Pt.>75%)  Comprehension Comprehension Mode: Auditory Comprehension: 3-Understands basic 50 - 74% of the time/requires cueing 25 - 50%  of the time  Expression Expression Mode: Verbal Expression: 4-Expresses basic 75 - 89% of the time/requires cueing 10 - 24% of the time. Needs helper to occlude trach/needs to repeat words.  Social Interaction Social Interaction: 2-Interacts appropriately 25 - 49% of time - Needs frequent redirection.  Problem Solving Problem Solving: 2-Solves basic 25 - 49% of the time - needs direction more than half the time to initiate, plan or complete simple activities  Memory Memory: 3-Recognizes or recalls 50 - 74% of the time/requires cueing 25 - 49% of the time  Medical Problem List and Plan:  1. Distal shaft tibia fracture, distal fibula fracture. Status post intramedullary nail, ORIF 09/09/2012  2. DVT Prophylaxis/Anticoagulation: Coumadin for DVT prophylaxis. Continue subcutaneous heparin until INR greater than 2.00. Monitor for any signs of bleeding. Doppler pending today 3. Pain Management: Hydrocodone as needed.--decreased  frequency 4. Neuropsych: This patient is capable of making decisions on his/her own behalf.  -mentation improving  -affect more dynamic 5. Diabetes mellitus with peripheral neuropathy. Hemoglobin A1c 7.8. Currently with sliding scale insulin. Check blood sugars a.c. and at bedtime. Sugars remain elevated. Only on SSI. Will increase scheduled lantus insulin to 15 units qhs 6. End-stage renal disease. Continue hemodialysis as directed per original services.  7. Hypertension. Norvasc 10 mg daily, labetalol 300 mg twice a day. 8. Chronic anemia. Continue Nulecit weekly with dialysis. Followup CBC with dialysis. Latest hemoglobin 9.7  9. Diastolic congestive heart failure. Following I's and O's, weights.  LOS (Days) 8 A FACE TO FACE EVALUATION WAS PERFORMED  SWARTZ,ZACHARY T 09/20/2012 7:41 AM

## 2012-09-20 NOTE — Plan of Care (Signed)
Problem: RH BOWEL ELIMINATION Goal: RH STG MANAGE BOWEL W/MEDICATION W/ASSISTANCE STG Manage Bowel with Medication with min Assistance.  Outcome: Not Progressing 09/20/12 Patients last BM 09/16/12. A.Dazani Norby,LPN

## 2012-09-20 NOTE — Progress Notes (Signed)
Physical Therapy Session Note  Patient Details  Name: KEIRON IODICE MRN: [id number] Date of Birth: [birth year]  Today's Date: 09/20/2012 Time: 1350-1420 Time Calculation (min): 30 min  Short Term Goals: Week 2:  PT Short Term Goal 1 (Week 2): = LTGs    Skilled Therapeutic Interventions/Progress Updates:   W/c mobility using bil UEs x 100' with cues for staying on R side of hall.  W/c> mat to R with use of RW, min assist, mod VCs for sequencing, managing w/c parts, avoiding LLE wt bearing during sit> stand.  Gait x 25' with RW with min assist for RW safety, as pt tends to push it too far ahead of himself, NWBing LLE.  Standing endurance x 3 minutes with L UE support, RUE fine motor task, cues for upright posture.   W/c parts management, focusing on L legrest swing away and elevation features.    Therapy Documentation Precautions:  Precautions Precautions: Fall Restrictions Weight Bearing Restrictions: Yes LLE Weight Bearing: Non weight bearing   Pain: Pain Score:  7 Pain Type: Surgical pain Pain Location: Leg Pain Orientation: Left;Lower Pain Intervention(s): Medication (See eMAR);RN made aware    See FIM for current functional status  Therapy/Group: Individual Therapy  Jovanna Hodges 09/20/2012, 3:49 PM

## 2012-09-20 NOTE — Progress Notes (Signed)
Occupational Therapy Weekly Progress Note & Session Note  Patient Details  Name: Oscar Castillo MRN: [id number] Date of Birth: 08-24-1950  Today's Date: 09/20/2012  WEEKLY PROGRESS NOTE  Patient has met 5 of 5 short term goals. Patient is making steady progress on CIR. Patient continues to present with decreased initiation and decreased problem solving during functional tasks, including ADL of bathing & dressing. Patient has progressed since initial evaluation where he had a very flat affect and refused therapies for the first few days on CIR. Patient also continues to have NWB orders for LLE and he has been able to adhere to precautions during ADL. Continue to recommend 24/7 supervision at discharge secondary to decreased cognition and poor safety awareness. Estimated d/c date =2/28.   Patient continues to demonstrate the following deficits: decreased overall cognition, decreased independence with BADLs & IADLs, decreased independence with functional mobility, poor safety awareness, decreased independence with transfers, decreased independence with sit<->stands, decreased independence with dynamic standing, decreased overall activity tolerance/endurance, decreased UE strength. Therefore, patient will continue to benefit from skilled OT intervention to enhance overall performance with BADL, iADL and Reduce care partner burden.  Patient progressing toward long term goals..  Continue plan of care.  OT Short Term Goals Week 1:  OT Short Term Goal 1 (Week 1): Pt will perform LB bathing sit to stand with mod assist. OT Short Term Goal 1 - Progress (Week 1): Met OT Short Term Goal 2 (Week 1): Pt will perform LB dressing sit to stand with mod assist. OT Short Term Goal 2 - Progress (Week 1): Met OT Short Term Goal 3 (Week 1): Pt will perform stand pivot or scoot pivot transfers to bedside toilet with mod assist. OT Short Term Goal 3 - Progress (Week 1): Met OT Short Term Goal 4 (Week 1): Pt will  demonstrate anticipatory awareness by stating that he needs assistance with all of his transfers and sit to stand when questioned by therapist. OT Short Term Goal 4 - Progress (Week 1): Met OT Short Term Goal 5 (Week 1): Pt will transfer from supine to sit EOB with no more than min assist in preparation for selfcare tasks.   OT Short Term Goal 5 - Progress (Week 1): Met  Week 2:  OT Short Term Goal 1 (Week 2): Short Term Goals=Long Term Goals  Skilled Therapeutic Interventions/Progress Updates:  Balance/vestibular training;Cognitive remediation/compensation;Community reintegration;Discharge planning;Disease mangement/prevention;DME/adaptive equipment instruction;Functional mobility training;Neuromuscular re-education;Pain management;Psychosocial support;Patient/family education;Self Care/advanced ADL retraining;Skin care/wound managment;Splinting/orthotics;Therapeutic Activities;Therapeutic Exercise;UE/LE Strength taining/ROM;UE/LE Coordination activities;Wheelchair propulsion/positioning   Precautions:  Precautions Precautions: Fall Restrictions Weight Bearing Restrictions: Yes LLE Weight Bearing: Non weight bearing  See FIM for current functional status  -------------------------------------------------------------------------------------  SESSION NOTE 4098-1191 - 55 Minutes Individual Therapy Patient with 7/10 complaints of pain, patient pre-medicated prior to session Patient found seated in w/c beside bed with quick release belt donned. Therapist gave patient verbal cues to propel self in w/c -> sink side for ADL. Patient then completed ADL with min verbal cues, but needing constant verbal cues for initiation and to continue ADL in order to complete. Focused skilled intervention on initiation, problem solving, sit<>stands, dynamic standing balance/tolerance/endurance, and overall activity tolerance/endurance. After ADL patient completed 3 sets of 8 w/c pushups for UB strengthening. At  end of session left patient seated in w/c beside bed with quick release belt donned and call bell & phone within reach.   Arelis Neumeier 09/20/2012, 8:45 AM

## 2012-09-20 NOTE — Progress Notes (Signed)
Occupational Therapy Session Note  Patient Details  Name: Oscar Castillo MRN: [id number] Date of Birth: 04/10/1951  Today's Date: 09/20/2012 Time: 4098-1191 Time Calculation (min): 31 min    Skilled Therapeutic Interventions/Progress Updates:    Pt seen for 1:1 OT session with focus on squat pivot transfers and UB strengthening to increase independence with sit<>stand. Pt completed squat pivot transfer WC<>mat X2 with Min A and Min VC for sequencing/adhering to NWB precautions on LLE. Pt completed UB and trunk exercises using 5lb weight bar with focus on increasing participation in sit<>stand during LB dressing and bathing tasks. Pt complained of 8-9/10 pain in LLE during session, elevated LLE and nursing notified.  Therapy Documentation Precautions:  Precautions Precautions: Fall Restrictions Weight Bearing Restrictions: Yes LLE Weight Bearing: Non weight bearing Pain: Pain Assessment Pain Assessment: Faces Pain Score:   9 Pain Type: Surgical pain Pain Location: Leg Pain Orientation: Left;Lower Pain Intervention(s): Medication (See eMAR);RN made aware, Elevation  See FIM for current functional status  Therapy/Group: Individual Therapy  Sunday Spillers 09/20/2012, 3:13 PM

## 2012-09-20 NOTE — Progress Notes (Signed)
Reviewed and agree with the attached treatment note.  Delaila Nand, OTR/L 

## 2012-09-20 NOTE — Progress Notes (Signed)
Physical Therapy Weekly Progress Note  Patient Details  Name: Oscar Castillo MRN: [id number] Date of Birth: [birth year]  Today's Date: 09/20/2012 Time: 0800-0830 Time Calculation (min): 30 min  Treatment session focused on bed mobility and transfers (extra time for transfer OOB due to difficulty with motor planning despite cueing, steady A needed due to decreased control) with appropriate hand placement and technique with and without use of RW. Sit to stands with steady A and cues for hand placement, gait x 30' with min A and cues to maintain RW closer to body to increase safety with mobility. Discussed home entry as pt reports ramp has not been started but he thinks there is a way into home with only a little threshold. Discussed with pt options for using w/c or RW to navigate threshold. Will need to clarify with pt's wife.   Patient has met 4 of 4 short term goals.  Pt is making progress with overall mobility and is more alert and participating this week. Initially, pt very flat and refusing therapy sessions. Cognition continues to be impaired with tasks; requiring frequent verbal cues for safety, motor planning, and sequencing. Still recommend 24/7 A due to cognitive impairments. Pt reports there is an alternative entrance into home with a threshold that wife may be able to push him in w/c or pt navigate with RW (will need to clarify this with wife as initial recommendation was a ramp due to multiple stairs for home entry). Will need to complete family education this week with pt's wife as she is primary caregiver.  Patient continues to demonstrate the following deficits: decreased strength, WB restrictions, decreased activity tolerance, impaired cognition, decreased safety awareness, decreased ROM and therefore will continue to benefit from skilled PT intervention to enhance overall performance with activity tolerance, balance, postural control, ability to compensate for deficits, awareness and  knowledge of precautions.  Patient progressing toward long term goals..  Continue plan of care.  PT Short Term Goals Week 1:  PT Short Term Goal 1 (Week 1): Pt will be able to transfer with min A squat pivot PT Short Term Goal 1 - Progress (Week 1): Met PT Short Term Goal 2 (Week 1): Pt will be able to to sit to stand with min A PT Short Term Goal 2 - Progress (Week 1): Met PT Short Term Goal 3 (Week 1): Pt will be able to gait with RW x 10' with mod A PT Short Term Goal 3 - Progress (Week 1): Met PT Short Term Goal 4 (Week 1): Pt will be able to demonstrate dynamic standing balance during functional task with min A PT Short Term Goal 4 - Progress (Week 1): Met Week 2:  PT Short Term Goal 1 (Week 2): = LTGs  Skilled Therapeutic Interventions/Progress Updates:  Ambulation/gait training;Balance/vestibular training;Cognitive remediation/compensation;Community reintegration;Discharge planning;Disease management/prevention;DME/adaptive equipment instruction;Functional mobility training;Neuromuscular re-education;Pain management;Patient/family education;Psychosocial support;Skin care/wound management;Splinting/orthotics;Stair training;Therapeutic Activities;Therapeutic Exercise;UE/LE Strength taining/ROM;UE/LE Coordination activities;Wheelchair propulsion/positioning   Therapy Documentation Precautions:  Precautions Precautions: Fall Restrictions Weight Bearing Restrictions: Yes LLE Weight Bearing: Non weight bearing  Pain: Pain Assessment Pain Score:   7 in LLE Pain medication given by RN Locomotion : Ambulation Ambulation/Gait Assistance: 4: Min assist   See FIM for current functional status  Therapy/Group: Individual Therapy  Karolee Stamps [facility] 09/20/2012, 9:23 AM

## 2012-09-21 ENCOUNTER — Inpatient Hospital Stay (HOSPITAL_COMMUNITY): Payer: Medicare Other | Admitting: Physical Therapy

## 2012-09-21 ENCOUNTER — Inpatient Hospital Stay (HOSPITAL_COMMUNITY): Payer: Medicare Other | Admitting: Occupational Therapy

## 2012-09-21 ENCOUNTER — Encounter (HOSPITAL_COMMUNITY): Payer: Medicare Other

## 2012-09-21 ENCOUNTER — Inpatient Hospital Stay (HOSPITAL_COMMUNITY): Payer: Medicare Other

## 2012-09-21 DIAGNOSIS — E1165 Type 2 diabetes mellitus with hyperglycemia: Secondary | ICD-10-CM

## 2012-09-21 DIAGNOSIS — S82209A Unspecified fracture of shaft of unspecified tibia, initial encounter for closed fracture: Secondary | ICD-10-CM

## 2012-09-21 DIAGNOSIS — N186 End stage renal disease: Secondary | ICD-10-CM

## 2012-09-21 DIAGNOSIS — W19XXXA Unspecified fall, initial encounter: Secondary | ICD-10-CM

## 2012-09-21 DIAGNOSIS — E1142 Type 2 diabetes mellitus with diabetic polyneuropathy: Secondary | ICD-10-CM

## 2012-09-21 DIAGNOSIS — S8263XA Displaced fracture of lateral malleolus of unspecified fibula, initial encounter for closed fracture: Secondary | ICD-10-CM

## 2012-09-21 LAB — GLUCOSE, CAPILLARY
Glucose-Capillary: 165 mg/dL — ABNORMAL HIGH (ref 70–99)
Glucose-Capillary: 138 mg/dL — ABNORMAL HIGH (ref 70–99)

## 2012-09-21 LAB — PROTIME-INR: INR: 2.52 — ABNORMAL HIGH (ref 0.00–1.49)

## 2012-09-21 MED ORDER — POLYETHYLENE GLYCOL 3350 17 G PO PACK
17.0000 g | PACK | Freq: Every day | ORAL | Status: DC
  Administered 2012-09-21 – 2012-09-23 (×3): 17 g via ORAL
  Filled 2012-09-21 (×5): qty 1

## 2012-09-21 NOTE — Progress Notes (Signed)
Subjective/Complaints: No major issues. Got back late from HD last night. A 12 point review of systems has been performed and if not noted above is otherwise negative.   Objective: Vital Signs: Blood pressure 143/85, pulse 84, temperature 98.8 F (37.1 C), temperature source Oral, resp. rate 18, weight 94.8 kg (208 lb 15.9 oz), SpO2 99.00%. No results found.  Recent Labs  09/20/12 1043 09/20/12 1619  WBC 7.5 8.9  HGB 10.9* 10.0*  HCT 33.8* 30.1*  PLT 497* 488*    Recent Labs  09/20/12 1043  NA 131*  K 5.8*  CL 88*  GLUCOSE 170*  BUN 51*  CREATININE 11.27*  CALCIUM 10.7*   CBG (last 3)   Recent Labs  09/20/12 1114 09/20/12 2038 09/21/12 0719  GLUCAP 162* 123* 109*    Wt Readings from Last 3 Encounters:  09/21/12 94.8 kg (208 lb 15.9 oz)  09/12/12 101.3 kg (223 lb 5.2 oz)  09/12/12 101.3 kg (223 lb 5.2 oz)    Physical Exam:  Constitutional: He appears well-developed and well-nourished. No distress. Slow to arouse HENT:  Head: Normocephalic and atraumatic.  Right Ear: External ear normal.  Left Ear: External ear normal.  Nose: Nose normal.  Mouth/Throat: Oropharynx is clear and moist.  Eyes: Conjunctivae and EOM are normal. Right eye exhibits no discharge. Left eye exhibits no discharge. No scleral icterus.  Pupils round and reactive to light  Neck: Neck supple. No JVD present. No tracheal deviation present. No thyromegaly present.  Cardiovascular: Normal rate and regular rhythm. Exam reveals no gallop and no friction rub.  No murmur heard.  Pulmonary/Chest: Effort normal and breath sounds normal. No respiratory distress. He has no wheezes. He has no rales. He exhibits no tenderness.  Abdominal: Soft. Bowel sounds are normal. He exhibits no distension. There is no tenderness. There is no rebound and no guarding.  Musculoskeletal:  Left leg surgery sites intact. Limb appropriately tender.  Lymphadenopathy:  He has no cervical adenopathy.  Neurological:   He displays normal reflexes. No cranial nerve deficit. Coordination normal.  Alert and appropriate. Better insight and awareness. UE are 5/5. He is unable to lift leg off chair rest. Moves toes of left foot. RLE is grossly 4- to 4/5. Stocking glove sensory loss in the feet  Skin: He is not diaphoretic.  Left Leg incisions are dressed. onchomycotic toe nails Psych: affect more dynamic    Assessment/Plan: 1. Functional deficits secondary to right distal tibia shaft fx, right distal fibula fx which require 3+ hours per day of interdisciplinary therapy in a comprehensive inpatient rehab setting. Physiatrist is providing close team supervision and 24 hour management of active medical problems listed below. Physiatrist and rehab team continue to assess barriers to discharge/monitor patient progress toward functional and medical goals.  Pt seems to be displaying better participation and performance with therapy. Will discuss with the team today whether he's advanced enough where we could entertain mod I goals   FIM: FIM - Bathing Bathing Steps Patient Completed: Chest;Right Arm;Left Arm;Abdomen;Front perineal area;Buttocks;Right upper leg;Left upper leg;Right lower leg (including foot) Bathing: 4: Steadying assist  FIM - Upper Body Dressing/Undressing Upper body dressing/undressing steps patient completed: Thread/unthread right sleeve of pullover shirt/dresss;Thread/unthread left sleeve of pullover shirt/dress;Put head through opening of pull over shirt/dress;Pull shirt over trunk Upper body dressing/undressing: 5: Set-up assist to: Obtain clothing/put away FIM - Lower Body Dressing/Undressing Lower body dressing/undressing steps patient completed: Thread/unthread left pants leg;Thread/unthread right pants leg;Pull pants up/down;Don/Doff right sock;Don/Doff right shoe;Fasten/unfasten right shoe  Lower body dressing/undressing: 4: Steadying Assist  FIM - Toileting Toileting steps completed by  patient: Performs perineal hygiene Toileting: 3: Mod-Patient completed 2 of 3 steps  FIM - Diplomatic Services operational officer Devices: Building control surveyor Transfers: 3-To toilet/BSC: Mod A (lift or lower assist);3-From toilet/BSC: Mod A (lift or lower assist)  FIM - Bed/Chair Transfer Bed/Chair Transfer Assistive Devices: Arm rests;Walker Bed/Chair Transfer: 4: Bed > Chair or W/C: Min A (steadying Pt. > 75%)  FIM - Locomotion: Wheelchair Distance: 200 Locomotion: Wheelchair: 2: Travels 50 - 149 ft with supervision, cueing or coaxing FIM - Locomotion: Ambulation Locomotion: Ambulation Assistive Devices: Designer, industrial/product Ambulation/Gait Assistance: 4: Min assist Locomotion: Ambulation: 1: Travels less than 50 ft with minimal assistance (Pt.>75%)  Comprehension Comprehension Mode: Auditory Comprehension: 4-Understands basic 75 - 89% of the time/requires cueing 10 - 24% of the time  Expression Expression Mode: Verbal Expression: 4-Expresses basic 75 - 89% of the time/requires cueing 10 - 24% of the time. Needs helper to occlude trach/needs to repeat words.  Social Interaction Social Interaction: 4-Interacts appropriately 75 - 89% of the time - Needs redirection for appropriate language or to initiate interaction.  Problem Solving Problem Solving Mode: Asleep Problem Solving: 4-Solves basic 75 - 89% of the time/requires cueing 10 - 24% of the time  Memory Memory: 4-Recognizes or recalls 75 - 89% of the time/requires cueing 10 - 24% of the time  Medical Problem List and Plan:  1. Distal shaft tibia fracture, distal fibula fracture. Status post intramedullary nail, ORIF 09/09/2012  2. DVT Prophylaxis/Anticoagulation: Coumadin for DVT prophylaxis. Continue subcutaneous heparin until INR greater than 2.00. Monitor for any signs of bleeding. Doppler pending today 3. Pain Management: Hydrocodone as needed.--decreased frequency 4. Neuropsych: This patient is capable of  making decisions on his/her own behalf.  -mentation improving  -affect more dynamic 5. Diabetes mellitus with peripheral neuropathy. Hemoglobin A1c 7.8. Currently with sliding scale insulin.   scheduled lantus insulin to 15 units qhs  -overall improved 6. End-stage renal disease. Continue hemodialysis as directed per original services.  7. Hypertension. Norvasc 10 mg daily, labetalol 300 mg twice a day. 8. Chronic anemia. Continue Nulecit weekly with dialysis. Followup CBC with dialysis. Latest hemoglobin 9.7  9. Diastolic congestive heart failure. Following I's and O's, weights.  LOS (Days) 9 A FACE TO FACE EVALUATION WAS PERFORMED  SWARTZ,ZACHARY T 09/21/2012 7:44 AM

## 2012-09-21 NOTE — Patient Care Conference (Signed)
Inpatient RehabilitationTeam Conference and Plan of Care Update Date: 09/20/2012   Time: 2:05 PM    Patient Name: Oscar Castillo      Medical Record Number: [id number]  Date of Birth: 27-Oct-1950 Sex: Male         Room/Bed: 4009/4009-01 Payor Info: Payor: MEDICARE  Plan: MEDICARE PART A AND B  Product Type: *No Product type*     Admitting Diagnosis: tib fib  Admit Date/Time:  09/12/2012  4:27 PM Admission Comments: No comment available   Primary Diagnosis:  Tibia/fibula fracture Principal Problem: Tibia/fibula fracture  Patient Active Problem List   Diagnosis Date Noted  . Tibia/fibula fracture 09/13/2012  . Diabetes mellitus 09/08/2012  . Left tibial fracture 09/08/2012  . ESRD on hemodialysis 09/08/2012  . HTN (hypertension) 09/08/2012  . LVH (left ventricular hypertrophy)-severe concentric 06/13/2012  . Anemia due to chronic illness 06/12/2012  . Bladder neck obstruction 05/06/2011  . Hyperlipidemia 01/30/2011  . CHOLELITHIASIS 08/01/2010  . BENIGN PROSTATIC HYPERTROPHY 08/01/2010  . ABDOMINAL ULTRASOUND, ABNORMAL 04/23/2010  . CEREBROVASCULAR ACCIDENT, HX OF 08/06/2009  . DEPRESSION 03/18/2009  . Acute combined systolic and diastolic heart failure, NYHA class 2-EF 45% 03/18/2009  . NEPHROLITHIASIS, HX OF 03/18/2009  . Morbid obesity 03/25/2007  . ERECTILE DYSFUNCTION 03/25/2007  . HYPERTENSION 03/25/2007  . GERD 03/25/2007  . HEPATITIS C, HX OF 03/25/2007    Expected Discharge Date: Expected Discharge Date: 09/23/12  Team Members Present: Physician leading conference: Dr. Faith Rogue Nurse Present: Daryll Brod, RN PT Present: Other (comment) Sherrine Maples, PT) OT Present: Mackie Pai, OT Other (Discipline and Name): Tora Duck, PPS Coordinator     Current Status/Progress Goal Weekly Team Focus  Medical   improved mobility, better participation. still unsafe  see prior  rx mood (antidepressant). limit pain meds.1   Bowel/Bladder   Pt  oliguric/continent of bowel LBM 09/16/12  remain continent of bowel and bladder      Swallow/Nutrition/ Hydration             ADL's   supervision->minimal assistance  overall supervision  ADL retraining, sit/stands, overall cognition, transfers, UE strengthening, overall activity tolerance/endurance   Mobility   min A transfers, S w/c mobility; extra time and verbal cues for tasks due to cognition  S w/c level; min A short distance gait; mod A for 1 step  d/c planning and family educaiton, gait training, safety, balance, strengthening   Communication             Safety/Cognition/ Behavioral Observations  no unsafe behavior         Pain   Complains of pain in LLE; 1 vicodin q6hrs prn  pain less than or equal to 3      Skin   ace wrap/slpint to LLE/  no skin breakdown       Rehab Goals Patient on target to meet rehab goals: Yes *See Interdisciplinary Assessment and Plan and progress notes for long and short-term goals  Barriers to Discharge: safety awareness, see prior    Possible Resolutions to Barriers:  full time supervision at home. AET    Discharge Planning/Teaching Needs:  plan to return home with wife who will arrange for someone to be with pt when she is at work.  need to schedule prior to d/c   Team Discussion:  Continues to show decreased cognition/ safety awareness but improved participation overall.  Need to clarify with pt's wife about entries at home and DME needs.  Need to schedule family education.  Revisions to Treatment Plan:  None at this time. On track for 2/28 d/c.   Continued Need for Acute Rehabilitation Level of Care: The patient requires daily medical management by a physician with specialized training in physical medicine and rehabilitation for the following conditions: Daily direction of a multidisciplinary physical rehabilitation program to ensure safe treatment while eliciting the highest outcome that is of practical value to the patient.:  Yes Daily medical management of patient stability for increased activity during participation in an intensive rehabilitation regime.: Yes Daily analysis of laboratory values and/or radiology reports with any subsequent need for medication adjustment of medical intervention for : Post surgical problems;Neurological problems (ESRD)  Hopie Pellegrin 09/21/2012, 11:04 AM

## 2012-09-21 NOTE — Progress Notes (Signed)
Occupational Therapy Note  Patient Details  Name: Oscar Castillo MRN: [id number] Date of Birth: 03/23/1951 Today's Date: 09/21/2012  Time #1: 7:30-8:30am ( .) Time #2: 10:35-11:18am ( .)  Tx #1: Pt seen for 1:1 OT session with COTA  Participating in bed mobility, transfers and bathing/dressing. Pt supine in bed upon arrival, c/o pain of 8/10 in LLE and also stating his stomach "doesn't feel right." Pt did not eat much breakfast. Pt received medications in middle of session for pain. Bed mobility and transfers took increased time overall and intermittent cueing for NWB precaution. Pt tires easily and required multiple rest breaks. Pt bathed at sink with several sit <> stands with mod A to bathe and dress. Asked nursing about brief as pt did not understand why he had to wear it. Nursing agreed pt could wear own underwear today. Verbal cueing for safety when standing to bathe and dress, as pt attempted to use bil hands instead of keeping one hand on sink for stability. Ended session with pt in wheelchair, LLE elevated, call bell and quick release belt in place. Pt reported pain rating of 7/10 at end of tx. Tx. #2: Pt seen 1:1 for UE strengthening. Pt sitting in recliner upon arrival c/o fatigue and pain of 7-8/10. Pt agreed to complete exercises in sitting, taking multiple rest breaks between sets. With yellow theraband, pt completed sh flexion, bicep curls, diagonal ABD and chest row exercises x10, 3 sets each. Pt required intermittent verbal/manual cueing for posturing and to maintain correct arm position during exercise. Pt seems to hike R shoulder up during exercise. Ended session with pt sitting in recliner, feet elevated, quick release belt on and call bell in place.    [name] [name] 09/21/2012, 11:27 AM

## 2012-09-21 NOTE — Progress Notes (Signed)
Occupational Therapy Session Note  Patient Details  Name: Oscar Castillo MRN: [id number] Date of Birth: [birth year]  Today's Date: 09/21/2012 Time: 1445-1510 Time Calculation (min): 25 min  Short Term Goals: Week 2:  OT Short Term Goal 1 (Week 2): Short Term Goals=Long Term Goals  Skilled Therapeutic Interventions/Progress Updates:  Patient resting in recliner upon arrival.  Engaged in transfers, grooming UB strengthening and bed mobility.  Focused session on sit><stand from low recliner and bed, walker safety, NWB during functional mobility and standing at sink, standing balance and tolerance, and theraband exercises supine in bed. Patient got very tired standing at sink and with LOB 4 times during session due to fatigue and during turns and backing up which required min-max assist to recover and patient with decreased ability to maintain NWB of LLE one time.    Therapy Documentation Precautions:  Precautions Precautions: Fall Restrictions Weight Bearing Restrictions: Yes LLE Weight Bearing: Non weight bearing Pain: Pain Assessment Pain Assessment: 0-10 Pain Score:   8 Pain Type: Surgical pain Pain Location: Leg Pain Orientation: Left;Lower Pain Descriptors: Aching Pain Frequency: Constant Pain Onset: On-going Patients Stated Pain Goal: 2 Pain Intervention(s): Medication (See eMAR)   Therapy/Group: Individual Therapy  Latasha Buczkowski 09/21/2012, 5:24 PM

## 2012-09-21 NOTE — Progress Notes (Signed)
ANTICOAGULATION CONSULT NOTE - Follow Up Consult  Pharmacy Consult for coumadin Indication: VTE prophylaxis  No Known Allergies  Patient Measurements: Weight: 208 lb 15.9 oz (94.8 kg) Heparin Dosing Weight:   Vital Signs: Temp: 98.8 F (37.1 C) (02/26 0532) Temp src: Oral (02/26 0532) BP: 143/85 mmHg (02/26 0532) Pulse Rate: 84 (02/26 0532)  Labs:  Recent Labs  09/19/12 0615 09/20/12 1033 09/20/12 1043 09/20/12 1619 09/21/12 0655  HGB  --   --  10.9* 10.0*  --   HCT  --   --  33.8* 30.1*  --   PLT  --   --  497* 488*  --   LABPROT 29.3* 29.4*  --   --  26.0*  INR 2.96* 2.98*  --   --  2.52*  CREATININE  --   --  11.27*  --   --     The CrCl is unknown because both a height and weight (above a minimum accepted value) are required for this calculation.   Medications:  Scheduled:  . [EXPIRED] acetaminophen      . brimonidine  1 drop Both Eyes BID  . brinzolamide  1 drop Both Eyes BID  . calcium acetate  1,334 mg Oral TID WC  . [EXPIRED] darbepoetin      . darbepoetin (ARANESP) injection - DIALYSIS  12.5 mcg Intravenous Q Tue-HD  . docusate sodium  100 mg Oral BID  . doxercalciferol  6 mcg Intravenous Q T,Th,Sa-HD  . ferric gluconate (FERRLECIT/NULECIT) IV  125 mg Intravenous Q T,Th,Sa-HD  . insulin aspart  0-9 Units Subcutaneous TID WC  . insulin glargine  15 Units Subcutaneous QHS  . pantoprazole  40 mg Oral Daily  . patient's guide to using coumadin book   Does not apply Once  . polyethylene glycol  17 g Oral Daily  . Travoprost (BAK Free)  1 drop Both Eyes QHS  . warfarin  2.5 mg Oral q1800  . Warfarin - Pharmacist Dosing Inpatient   Does not apply q1800  . [DISCONTINUED] polyethylene glycol  68 g Oral Daily   Infusions:    Assessment: 62 yo male s/p ORIF is currently on therapeutic coumadin. INR is 2.52. Yesterday's dose was not charted, likely missed dose while patient was in HD.   Goal of Therapy:  INR 2-3    Plan:  1) Continue coumadin 2.5mg  po  qday 2) INR in am.  Bayard Hugger, PharmD, BCPS  Clinical Pharmacist  Pager: [phone]   09/21/2012,9:19 AM

## 2012-09-21 NOTE — Progress Notes (Signed)
Physical Therapy Note  Patient Details  Name: Oscar Castillo MRN: [id number] Date of Birth: 01/03/1951 Today's Date: 09/21/2012  5621-3086 (60 minutes) individual Pain : initially 6/10 LT LE/ pt reports that pain meds given this AM Focus of treatment: WC mobility/ endurance; gait training NWB LT LE ; car transfer training; sit to stand  Treatment: Wc mobility- 100 feet SBA with increased time before c/o fatigue; car transfer with reminder cues for brakes , max cues for legrest removal; sit to stand with max vcs for hand placement min assist secondary to decreased forward trunk lean with loss of balance posteriorly ; transfer with RW with assist to prevent posterior loss of balance; sit to stand from car min assist with max vcs for hand placement; sit to stand X 3 from raised mat with emphasis on hand placement, forward trunk lean; LT LE AROM strengthening in supine - hip flexion/extension, hip abduction; sit to stand -pt uses back of mat to assist with sit to stand and to maintain balance; gait 40 feet RW min assist with one loss of balance when turning; returned to room with c/o nausea (pt diaporetic) / nurse notified. Pt left in recliner with quick release belt in place.     nHOUT,JIM 09/21/2012, 9:25 AM

## 2012-09-21 NOTE — Progress Notes (Signed)
Subjective:   Sitting in chair, no current complaints,  Objective: Vital signs in last 24 hours: Temp:  [98.5 F (36.9 C)-98.8 F (37.1 C)] 98.8 F (37.1 C) (02/26 0532) Pulse Rate:  [82-92] 84 (02/26 0532) Resp:  [15-20] 18 (02/26 0532) BP: (134-173)/(52-96) 143/85 mmHg (02/26 0532) SpO2:  [98 %-100 %] 99 % (02/26 0532) Weight:  [94.6 kg (208 lb 8.9 oz)-97.1 kg (214 lb 1.1 oz)] 94.8 kg (208 lb 15.9 oz) (02/26 0532) Weight change:   Intake/Output from previous day: 02/25 0701 - 02/26 0700 In: 440 [P.O.:440] Out: 3300    EXAM: General appearance:  Alert, in no apparent distress Resp:  CTA without rales, rhonchi, or wheezes Cardio:  RRR without murmur or rub GI:  + BS, soft and nontender Extremities:  No edema, left leg in cast Access:  AVF @ LUA with + bruit, right IJ catheter  Lab Results:  Recent Labs  09/20/12 1043 09/20/12 1619  WBC 7.5 8.9  HGB 10.9* 10.0*  HCT 33.8* 30.1*  PLT 497* 488*   BMET:  Recent Labs  09/20/12 1043  NA 131*  K 5.8*  CL 88*  CO2 27  GLUCOSE 170*  BUN 51*  CREATININE 11.27*  CALCIUM 10.7*  ALBUMIN 3.4*   No results found for this basename: PTH,  in the last 72 hours Iron Studies: No results found for this basename: IRON, TIBC, TRANSFERRIN, FERRITIN,  in the last 72 hours  Dialysis Orders: Center: [place] on TTS.  EDW 99 kg HD Bath 2K/2.25Ca Time 4 hrs Heparin 5000 U. Access Right IJ catheter, also L BVT (placed 06/14/12) BFR 400 DFR 800 Hectorol 6 mcg IV/HD Epogen 0 Units IV/HD Venofer 50 mg on Thurs.   Assessment/Plan: 1. Displaced left tibial fx - secondary to fall, ORIF 2/14 per Dr. Dion Saucier, rehab since 2/17. 2. ESRD - HD on TTS @ [place]; K 5.8 pre-HD yesterday; used new AVF successfully with 2 needles and BFR 250 on 2/22 and 2/25. HD tomorrow. 3. Hypertension/volume - BP 143/85 most recently, on outpatient Amlodipine 10 mg qd and Labetalol 300 mg bid, currently off both; current wt 94.8 kg s/p net UF  3.3 L yesterday with EDW 99  kg. Lower EDW at discharge. 4. Anemia - Hgb 10, Fe sat 12% with ferritin 306 on 2/20; Aranesp 12.5 mcg on Tues and IV Fe qHD. 5. Metabolic bone disease - Ca 10.7 (11.2 corrected), P 5.2; Hectorol 6 mcg, Phoslo 2 with meals.  Use 2Ca bath. 6. Nutrition - Alb 3.4, renal diet. 7. DM - on insulin per primary. 8. Hx CVA - in 07/2009. 9. Hx nephrolithiasis 10. VTE prophylaxis - on short-term Coumadin per pharmacy.    LOS: 9 days   LYLES,CHARLES 09/21/2012,8:54 AM  Patient seen and examined.  Agree with assessment and plan as above. Vinson Moselle  MD [phone] pgr    [phone] cell 09/21/2012, 9:56 AM

## 2012-09-22 ENCOUNTER — Inpatient Hospital Stay (HOSPITAL_COMMUNITY): Payer: Medicare Other

## 2012-09-22 ENCOUNTER — Inpatient Hospital Stay (HOSPITAL_COMMUNITY): Payer: Medicare Other | Admitting: Physical Therapy

## 2012-09-22 ENCOUNTER — Inpatient Hospital Stay (HOSPITAL_COMMUNITY): Payer: Medicare Other | Admitting: Occupational Therapy

## 2012-09-22 LAB — RENAL FUNCTION PANEL
BUN: 50 mg/dL — ABNORMAL HIGH (ref 6–23)
Chloride: 89 mEq/L — ABNORMAL LOW (ref 96–112)
Creatinine, Ser: 11.48 mg/dL — ABNORMAL HIGH (ref 0.50–1.35)
Glucose, Bld: 140 mg/dL — ABNORMAL HIGH (ref 70–99)
Phosphorus: 5.4 mg/dL — ABNORMAL HIGH (ref 2.3–4.6)
Potassium: 4.7 mEq/L (ref 3.5–5.1)

## 2012-09-22 LAB — GLUCOSE, CAPILLARY
Glucose-Capillary: 124 mg/dL — ABNORMAL HIGH (ref 70–99)
Glucose-Capillary: 153 mg/dL — ABNORMAL HIGH (ref 70–99)

## 2012-09-22 LAB — CBC
HCT: 32 % — ABNORMAL LOW (ref 39.0–52.0)
Hemoglobin: 10.3 g/dL — ABNORMAL LOW (ref 13.0–17.0)
MCHC: 32.2 g/dL (ref 30.0–36.0)
MCV: 83.8 fL (ref 78.0–100.0)

## 2012-09-22 MED ORDER — PENTAFLUOROPROP-TETRAFLUOROETH EX AERO
INHALATION_SPRAY | CUTANEOUS | Status: AC
  Filled 2012-09-22: qty 103.5

## 2012-09-22 MED ORDER — WARFARIN SODIUM 1 MG PO TABS
1.0000 mg | ORAL_TABLET | Freq: Once | ORAL | Status: AC
  Administered 2012-09-22: 1 mg via ORAL
  Filled 2012-09-22: qty 1

## 2012-09-22 MED ORDER — DOXERCALCIFEROL 4 MCG/2ML IV SOLN
INTRAVENOUS | Status: AC
  Filled 2012-09-22: qty 4

## 2012-09-22 NOTE — Progress Notes (Signed)
Physical Therapy Session Note  Patient Details  Name: Oscar Castillo MRN: [id number] Date of Birth: 04/19/1951  Today's Date: 09/22/2012 Time: 0730-0830 Time Calculation (min): 60 min  Short Term Goals: Week 2:  PT Short Term Goal 1 (Week 2): = LTGs  Skilled Therapeutic Interventions/Progress Updates:    Discussion with pt's wife in regards to home entry (level), 1 step in the den which he will need to be accessed only when going in/out of the house, DME (only has RW and will need to order w/c), and for wife to come in this afternoon for Family education with emphasis on bumping pt up in w/c, car transfers, and education on safety/cueing needed by patient. Wife repots planning to have hired help with pt during the day while she works. (she had to leave for work at this time).  Rest of session focused on transfer training (with and without use of RW) with emphasis on safe technique, hand placement, and w/c parts management. Handout given with step by step instructions and reviewed with patient to aid with memory. Gait with RW with min A for functional strengthening and endurance, limited foot clearance noted and discussed recommendation for minimal walking at home (only with A) to which pt verbalized understanding and in agreement.   Therapy Documentation Precautions:  Precautions Precautions: Fall Restrictions Weight Bearing Restrictions: Yes LLE Weight Bearing: Non weight bearing  Pain: Reports pain is ok right now.   See FIM for current functional status  Therapy/Group: Individual Therapy  Karolee Stamps [facility] 09/22/2012, 9:17 AM

## 2012-09-22 NOTE — Progress Notes (Signed)
Physical Therapy Session Note  Patient Details  Name: Oscar Castillo MRN: [id number] Date of Birth: [birth year]  Today's Date: 09/22/2012 Time: 1000-1035 Time Calculation (min): 35 min  Short Term Goals: Week 2:  PT Short Term Goal 1 (Week 2): = LTGs  Skilled Therapeutic Interventions/Progress Updates:    Session focused on trying to utilized written instruction provided by previous PT for transfer bed <> wheelchair. Pt physically able to perform task with supervision but requires mod/max verbal instruction even with written instruction. Pt also has significant difficulty finding parts to manage armrest and legrests. Pt given typed instruction as he seems to have some difficulty reading written instruction. Wheelchair mobility in home environment also performed with supervision/modified independent (increased time due to poor problem solving capabilities).   Pt continues to require cues for non-weight bearing status of Lt. LE during functional mobility. Pt continues to be limited with safety and carryover secondary to impaired cognition.   Therapy Documentation Precautions:  Precautions Precautions: Fall Restrictions Weight Bearing Restrictions: Yes LLE Weight Bearing: Non weight bearing Pain: Pain Assessment Pain Assessment: 0-10 Pain Score:   6 Pain Type: Acute pain Pain Location: Leg Pain Orientation: Left Pain Descriptors: Aching Pain Frequency: Constant Pain Onset: On-going Patients Stated Pain Goal: 2 Pain Intervention(s):  (pt reports not time for medication)  See FIM for current functional status  Therapy/Group: Individual Therapy  Wilhemina Bonito 09/22/2012, 11:56 AM

## 2012-09-22 NOTE — Progress Notes (Signed)
Social Work Patient ID: Oscar Castillo, male   DOB: [birth year], 62 y.o.   MRN: [id number]   Spoke with pt's wife this morning to review support services available for home.  Wife reports that she contacted Medicare representative and was informed that "... As long as the doctor writes for it, they will cover for and agency to provide a caregiver for pt while I'm  at work."  Had extensive discussion with wife explaining differences between skilled care needs in the home vs. Custodial care needs in regards to Medicare coverage.  Wife remained skeptical of my information, therefore, I encouraged her to contact a local HH agency and speak to them directly about what services they could provide under Medicare coverage.    Although skeptical, wife did report that she did not know that she needed to contact agencies and would "trust" my information.  I did review SNF option with wife, however, stressed that coverage of SNF would be very time limited as he really has little skilled need beyond some PT.  She states that pt "would never want to go to a nursing home" and that she is going to make arrangements with their family members to have someone stay with him when she is at work.  (I had also recommended Adult Day Care programs, however, wife said "paying for anything out of pocket is out of the question". )    We are agreed to proceed with planned d/c for pt tomorrow with [facility] follow services to be arranged.    Oscar Mane, LCSW

## 2012-09-22 NOTE — Plan of Care (Signed)
Problem: RH Stairs Goal: LTG Patient will ambulate up and down stairs w/assist (PT) LTG: Patient will ambulate up and down # of stairs with assistance (PT)  Outcome: Not Met (add Reason) Unsafe due to difficulty with WB status and decreased strength. Wife able to demonstrate bumping w/c up/down step for home entry.

## 2012-09-22 NOTE — Progress Notes (Signed)
Occupational Therapy Session Note  Patient Details  Name: Oscar Castillo MRN: [id number] Date of Birth: 04-22-1951  Today's Date: 09/22/2012  Short Term Goals: Week 1:  OT Short Term Goal 1 (Week 1): Pt will perform LB bathing sit to stand with mod assist. OT Short Term Goal 1 - Progress (Week 1): Met OT Short Term Goal 2 (Week 1): Pt will perform LB dressing sit to stand with mod assist. OT Short Term Goal 2 - Progress (Week 1): Met OT Short Term Goal 3 (Week 1): Pt will perform stand pivot or scoot pivot transfers to bedside toilet with mod assist. OT Short Term Goal 3 - Progress (Week 1): Met OT Short Term Goal 4 (Week 1): Pt will demonstrate anticipatory awareness by stating that he needs assistance with all of his transfers and sit to stand when questioned by therapist. OT Short Term Goal 4 - Progress (Week 1): Met OT Short Term Goal 5 (Week 1): Pt will transfer from supine to sit EOB with no more than min assist in preparation for selfcare tasks.   OT Short Term Goal 5 - Progress (Week 1): Met  Week 2:  OT Short Term Goal 1 (Week 2): Short Term Goals=Long Term Goals  Skilled Therapeutic Interventions/Progress Updates:   Session #1 [phone] - 45 Minutes Individual Therapy Patient with 9/10 complaints of pain in LLE; RN made aware Patient found seated in w/c beside dresser, when therapist entered room patient immediately asked where a his washcloth was.  From there patient propelled self -> sink for UB/LB bathing & dressing. During ADL patient took more than reasonable amount of time to complete task. Patient's initiation and problem solving skills have improved, but patient continues to occasionally required min verbal cues for overall cognition. Focused skilled intervention on sit<>stands, dynamic standing balance/tolerance/endurance, UB/LB bathing & dressing, grooming tasks seated at sink, overall cognition, toilet transfer, toileting, tub/shower transfer on/off tub transfer bench,  and overall activity tolerance/endurance. Patient able to perform routine "like at home". Patient states he does not take showers secondary to catheter placement. Patient's wife not present during session for any education, patient however states wife helps at home prn. At end of session left patient seated in w/c with quick release belt donned and call bell & phone within reach.   Session #2 1130-1200 - 30 Minutes Individual Therapy No complaints of pain Patient found seated in w/c with quick release belt donned. Therapist propelled patient from room -> ADL apartment for simple meal prep at w/c level in kitchen. Patient performed simple meal prep of making jell-O with physical supervision and minimal verbal cues for safety and problem solving. At end of session, therapist propelled patient back to room and left patient seated in w/c with quick release belt donned and call bell & phone within reach.   Precautions:  Precautions Precautions: Fall Restrictions Weight Bearing Restrictions: Yes LLE Weight Bearing: Non weight bearing  See FIM for current functional status  Oscar Castillo 09/22/2012, 7:30 AM

## 2012-09-22 NOTE — Discharge Summary (Signed)
NAMEMarland Kitchen  MINARD, MILLIRONS NO.:  [id number]  MEDICAL RECORD NO.:  [id number]  LOCATION:  4009                         FACILITY:  [facility]  PHYSICIAN:  Mariam Dollar, P.A.  DATE OF BIRTH:  11/12/1950  DATE OF ADMISSION:  09/12/2012 DATE OF DISCHARGE:  09/23/2012                              DISCHARGE SUMMARY   DISCHARGE DIAGNOSES: 1. Distal shaft tibia fracture, distal fibula fracture with     intramedullary nailing, open reduction and internal fixation,     September 09, 2012.  Coumadin for DVT prophylaxis. 2. Diabetes mellitus. 3. Peripheral neuropathy. 4. End-stage renal disease with hemodialysis. 5. Hypertension. 6. Chronic anemia. 7. Diastolic congestive heart failure.  HISTORY OF PRESENT ILLNESS:  This is a 62 year old right-handed male with end-stage renal disease, who was admitted September 08, 2012, after a fall while cleaning snow from his porch.  There was no loss of consciousness.  Sustained left tibia and distal fibula fractures. Underwent IM nailing, ORIF September 09, 2012, per Dr. Dion Saucier.  Advised nonweightbearing left lower extremity.  Placed on Coumadin for DVT prophylaxis with subcutaneous heparin to INR greater than 2.00. Postoperative attempts to get out of bed, there is bed alarm for safety. Hemodialysis ongoing.  Physical and occupational therapy ongoing.  The patient was admitted for comprehensive rehab program.  PAST MEDICAL HISTORY:  See discharge diagnoses.  SOCIAL HISTORY:  Lives with spouse, 1-level home.  FUNCTIONAL HISTORY:  Prior to admission was independent, retired.  He did drive to dialysis.  Functional status upon admission to rehab services was ambulating 4 feet with a rolling walker.  PHYSICAL EXAMINATION:  VITAL SIGNS:  Blood pressure 133/76, pulse 85, temperature 98.9, respirations 18. GENERAL:  This was an alert male, although easily distracted.  He was able to provide his name, age, and place.  He needed some cues for  his home situation and living with his wife.  He follows simple commands. LUNGS:  Clear to auscultation. CARDIAC:  Rate controlled. ABDOMEN:  Soft, nontender.  Good bowel sounds.  Surgical site healing nicely.  REHABILITATION HOSPITAL COURSE:  The patient was admitted to inpatient rehab services with therapies initiated on a 3-hour daily basis consisting of physical therapy, occupational therapy, and rehabilitation nursing.  The following issues were addressed during the patient's rehabilitation stay.  Pertaining to Mr. Macgowan distal tibia fibula fracture, he had undergone IM nailing, ORIF per Dr. Dion Saucier and advised nonweightbearing.  Neurovascular sensation intact.  He remained on Coumadin for DVT prophylaxis until October 07, 2012, with a home health nurse to be arranged.  Pain management with the use of hydrocodone as needed.  Decreased frequency due to some issues in regards to lethargy and confusion, it did continue to improve.  Diabetes mellitus with peripheral neuropathy.  Hemoglobin A1c of 7.8.  He remained on insulin therapy.  Ongoing hemodialysis as per [facility]al Services and monitored.  He exhibited no signs of orthostatic blood pressure.  He did have a history of diastolic congestive heart failure exhibited no signs of fluid overload.  The patient received weekly collaborative interdisciplinary team conferences to discuss estimated length of stay, family teaching, and any barriers to his discharge.  He was  supervision minimal assist overall assistance for activities of daily living, minimal assist transfers, supervision wheelchair mobility, extra time in verbal cues for tasks due to some cognitive issues.  He exhibited no unsafe behavior during his rehab stay.  Wife was to assist patient as well as provide assistance as needed while she was at work.  Ongoing therapies would be dictated as per rehab services.  DISCHARGE MEDICATIONS:  Renal diet, Alphagan ophthalmic solution 1  drop both eyes daily, Azopt 1 drop both eyes twice daily, PhosLo 1334 mg t.i.d., Lantus insulin 15 units at bedtime, Protonix 40 mg daily, MiraLax 17 g daily with 8 ounces of water hold for loose stools, Ultram 50 mg every 8 hours as needed pain, Travatan 1 drop both eyes at bedtime, Coumadin 2.5 mg daily adjusted accordingly for an INR of 2.0 to 3.0 with Coumadin to be completed October 07, 2012.  SPECIAL INSTRUCTIONS:  Home health nurse to check INR on September 26, 2012, while patient remained on Coumadin for DVT prophylaxis to be completed October 07, 2012.  Continue hemodialysis as advised.  Ongoing therapies were arranged as per rehab services.  The patient is nonweightbearing to lower extremity.     Mariam Dollar, P.A.     DA/MEDQ  D:  09/22/2012  T:  09/22/2012  Job:  [id number]  cc:   Ranelle Oyster, M.D. Eulas Post, MD Corwin Levins, MD Maree Krabbe, M.D.

## 2012-09-22 NOTE — Progress Notes (Signed)
ANTICOAGULATION CONSULT NOTE - Follow Up Consult  Pharmacy Consult for Coumadin Indication: VTE prophylaxis  No Known Allergies  Patient Measurements: Weight: 211 lb 3.2 oz (95.8 kg) Heparin Dosing Weight:   Vital Signs: Temp: 98.4 F (36.9 C) (02/27 1755) Temp src: Oral (02/27 1300) BP: 125/80 mmHg (02/27 1930) Pulse Rate: 83 (02/27 1930)  Labs:  Recent Labs  09/20/12 1033  09/20/12 1043 09/20/12 1619 09/21/12 0655 09/22/12 1821  HGB  --   < > 10.9* 10.0*  --  10.3*  HCT  --   --  33.8* 30.1*  --  32.0*  PLT  --   --  497* 488*  --  470*  LABPROT 29.4*  --   --   --  26.0* 30.7*  INR 2.98*  --   --   --  2.52* 3.16*  CREATININE  --   --  11.27*  --   --  11.48*  < > = values in this interval not displayed.  The CrCl is unknown because both a height and weight (above a minimum accepted value) are required for this calculation.   Medications:  Scheduled:  . brimonidine  1 drop Both Eyes BID  . brinzolamide  1 drop Both Eyes BID  . calcium acetate  1,334 mg Oral TID WC  . darbepoetin (ARANESP) injection - DIALYSIS  12.5 mcg Intravenous Q Tue-HD  . docusate sodium  100 mg Oral BID  . doxercalciferol      . doxercalciferol  6 mcg Intravenous Q T,Th,Sa-HD  . ferric gluconate (FERRLECIT/NULECIT) IV  125 mg Intravenous Q T,Th,Sa-HD  . insulin aspart  0-9 Units Subcutaneous TID WC  . insulin glargine  15 Units Subcutaneous QHS  . pantoprazole  40 mg Oral Daily  . patient's guide to using coumadin book   Does not apply Once  . pentafluoroprop-tetrafluoroeth      . polyethylene glycol  17 g Oral Daily  . Travoprost (BAK Free)  1 drop Both Eyes QHS  . warfarin  2.5 mg Oral q1800  . Warfarin - Pharmacist Dosing Inpatient   Does not apply q1800    Assessment: 62yo male on Coumadin for VTE px s/p L-tib fx and ORIF on 2/14.  INR is up today to 3.16.  Pt is currently in HD and has not received his dose today.  No bleeding problems noted.  Goal of Therapy:  INR  2-3 Monitor platelets by anticoagulation protocol: Yes   Plan:  1.  Change Coumadin to 1mg  today 2.  F/U INR in AM  Marisue Humble, PharmD Clinical Pharmacist [facility]

## 2012-09-22 NOTE — Progress Notes (Signed)
Physical Therapy Session Note  Patient Details  Name: Oscar Castillo MRN: [id number] Date of Birth: September 23, 1950  Today's Date: 09/22/2012 Time: 5784-6962 Time Calculation (min): 32 min  Short Term Goals: Week 2:  PT Short Term Goal 1 (Week 2): = LTGs  Skilled Therapeutic Interventions/Progress Updates:    Treatment session focused on family education with pt and pt's wife in regards to basic transfers (with and without RW), cues needed for safe set up of transfer, w/c parts management and breakdown, simulated car transfer using RW, and bumping w/c up/down 1 step for home entry. Wife able to successfully return demonstrate all tasks and verbalized understanding of recommendations for need for 24/7 S with mobility and limited standing/gait with RW (min A level). Pt continues to require cueing for safe technique with mobility.   Therapy Documentation Precautions:  Precautions Precautions: Fall Restrictions Weight Bearing Restrictions: Yes LLE Weight Bearing: Non weight bearing Pain: No complaints.  See FIM for current functional status  Therapy/Group: Individual Therapy  Karolee Stamps [facility] 09/22/2012, 2:19 PM

## 2012-09-22 NOTE — Progress Notes (Signed)
Subjective/Complaints: No complaints. Rested well. Had a better day yesterday A 12 point review of systems has been performed and if not noted above is otherwise negative.   Objective: Vital Signs: Blood pressure 136/79, pulse 80, temperature 98.5 F (36.9 C), temperature source Oral, resp. rate 17, weight 94.6 kg (208 lb 8.9 oz), SpO2 100.00%. No results found.  Recent Labs  09/20/12 1043 09/20/12 1619  WBC 7.5 8.9  HGB 10.9* 10.0*  HCT 33.8* 30.1*  PLT 497* 488*    Recent Labs  09/20/12 1043  NA 131*  K 5.8*  CL 88*  GLUCOSE 170*  BUN 51*  CREATININE 11.27*  CALCIUM 10.7*   CBG (last 3)   Recent Labs  09/21/12 1127 09/21/12 1642 09/21/12 2104  GLUCAP 169* 138* 137*    Wt Readings from Last 3 Encounters:  09/22/12 94.6 kg (208 lb 8.9 oz)  09/12/12 101.3 kg (223 lb 5.2 oz)  09/12/12 101.3 kg (223 lb 5.2 oz)    Physical Exam:  Constitutional: He appears well-developed and well-nourished. No distress. Slow to arouse HENT:  Head: Normocephalic and atraumatic.  Right Ear: External ear normal.  Left Ear: External ear normal.  Nose: Nose normal.  Mouth/Throat: Oropharynx is clear and moist.  Eyes: Conjunctivae and EOM are normal. Right eye exhibits no discharge. Left eye exhibits no discharge. No scleral icterus.  Pupils round and reactive to light  Neck: Neck supple. No JVD present. No tracheal deviation present. No thyromegaly present.  Cardiovascular: Normal rate and regular rhythm. Exam reveals no gallop and no friction rub.  No murmur heard.  Pulmonary/Chest: Effort normal and breath sounds normal. No respiratory distress. He has no wheezes. He has no rales. He exhibits no tenderness.  Abdominal: Soft. Bowel sounds are normal. He exhibits no distension. There is no tenderness. There is no rebound and no guarding.  Musculoskeletal:  Left leg surgery sites intact. Limb appropriately tender.  Lymphadenopathy:  He has no cervical adenopathy.   Neurological:  He displays normal reflexes. No cranial nerve deficit. Coordination normal.  Alert and appropriate. Better insight and awareness. UE are 5/5. He is unable to lift leg off chair rest. Moves toes of left foot. RLE is grossly 4- to 4/5. Stocking glove sensory loss in the feet  Skin: He is not diaphoretic.  Left Leg incisions are dressed. onchomycotic toe nails Psych: affect more dynamic    Assessment/Plan: 1. Functional deficits secondary to right distal tibia shaft fx, right distal fibula fx which require 3+ hours per day of interdisciplinary therapy in a comprehensive inpatient rehab setting. Physiatrist is providing close team supervision and 24 hour management of active medical problems listed below. Physiatrist and rehab team continue to assess barriers to discharge/monitor patient progress toward functional and medical goals.  Pt's wife states their insurance covers aide at home up to 8 hours per day.   FIM: FIM - Bathing Bathing Steps Patient Completed: Chest;Right Arm;Left Arm;Abdomen;Front perineal area;Buttocks;Right upper leg;Left upper leg Bathing: 4: Min-Patient completes 8-9 47f 10 parts or 75+ percent  FIM - Upper Body Dressing/Undressing Upper body dressing/undressing steps patient completed: Thread/unthread right sleeve of pullover shirt/dresss;Thread/unthread left sleeve of pullover shirt/dress;Put head through opening of pull over shirt/dress;Pull shirt over trunk Upper body dressing/undressing: 5: Supervision: Safety issues/verbal cues FIM - Lower Body Dressing/Undressing Lower body dressing/undressing steps patient completed: Thread/unthread right underwear leg;Pull underwear up/down;Thread/unthread right pants leg;Pull pants up/down (max A to donn R TED hose) Lower body dressing/undressing: 2: Max-Patient completed 25-49% of tasks (nursing  approved pt to be in underwear instead of brief )  FIM - Toileting Toileting steps completed by patient: Performs  perineal hygiene Toileting: 0: Activity did not occur  FIM - Diplomatic Services operational officer Devices: Building control surveyor Transfers: 0-Activity did not occur  FIM - Banker Devices: Bed rails;Arm rests;HOB elevated Bed/Chair Transfer: 5: Supine > Sit: Supervision (verbal cues/safety issues);3: Bed > Chair or W/C: Mod A (lift or lower assist)  FIM - Locomotion: Wheelchair Distance: 200 Locomotion: Wheelchair: 2: Travels 50 - 149 ft with supervision, cueing or coaxing FIM - Locomotion: Ambulation Locomotion: Ambulation Assistive Devices: Designer, industrial/product Ambulation/Gait Assistance: 4: Min assist Locomotion: Ambulation: 1: Travels less than 50 ft with minimal assistance (Pt.>75%)  Comprehension Comprehension Mode: Auditory Comprehension: 4-Understands basic 75 - 89% of the time/requires cueing 10 - 24% of the time  Expression Expression Mode: Verbal Expression: 4-Expresses basic 75 - 89% of the time/requires cueing 10 - 24% of the time. Needs helper to occlude trach/needs to repeat words.  Social Interaction Social Interaction: 4-Interacts appropriately 75 - 89% of the time - Needs redirection for appropriate language or to initiate interaction.  Problem Solving Problem Solving Mode: Asleep Problem Solving: 4-Solves basic 75 - 89% of the time/requires cueing 10 - 24% of the time  Memory Memory: 4-Recognizes or recalls 75 - 89% of the time/requires cueing 10 - 24% of the time  Medical Problem List and Plan:  1. Distal shaft tibia fracture, distal fibula fracture. Status post intramedullary nail, ORIF 09/09/2012  2. DVT Prophylaxis/Anticoagulation: Coumadin for DVT prophylaxis. Continue subcutaneous heparin until INR greater than 2.00. Monitor for any signs of bleeding.   3. Pain Management: Hydrocodone as needed.--decreased frequency 4. Neuropsych: This patient is capable of making decisions on his/her own  behalf.  -mentation close to baseline?  -affect more dynamic 5. Diabetes mellitus with peripheral neuropathy. Hemoglobin A1c 7.8. Currently with sliding scale insulin.   scheduled lantus insulin to 15 units qhs  -overall improved 6. End-stage renal disease. Continue hemodialysis as directed per original services.  7. Hypertension. Norvasc 10 mg daily, labetalol 300 mg twice a day. 8. Chronic anemia. Continue Nulecit weekly with dialysis. Followup CBC with dialysis. Latest hemoglobin 9.7  9. Diastolic congestive heart failure. Following I's and O's, weights.  LOS (Days) 10 A FACE TO FACE EVALUATION WAS PERFORMED  Milee Qualls T 09/22/2012 7:14 AM

## 2012-09-22 NOTE — Discharge Summary (Signed)
  Discharge summary job # [id number]

## 2012-09-22 NOTE — Progress Notes (Signed)
Physical Therapy Discharge Summary  Patient Details  Name: Oscar Castillo MRN: [id number] Date of Birth: December 26, 1950  Today's Date: 09/22/2012  Patient has met 5 of 9 long term goals due to improved activity tolerance, improved balance, increased strength and decreased pain. Pt continues to be NWB to LLE. Patient to discharge at a wheelchair level Supervision for safety and needed cueing.  Patient's care partner is independent to provide the necessary cognitive and physical assistance at discharge. Pt's wife completed family education.  Reasons goals not met: Pt still requires S/cues for w/c mobility for initiation and problem solving through obstacles (goal was mod I). Pt's standing balance fluctuates from S/min A (goal for S).   Recommendation:  Patient will benefit from ongoing skilled PT services in home health setting to continue to advance safe functional mobility, address ongoing impairments in strength, balance ,ROM, endurance, cognition, functional mobility, gait, and minimize fall risk.  Equipment: 18x18 w/c with elevating leg rests and basic cushion. Pt owns RW.  Reasons for discharge: discharge from hospital  Patient/family agrees with progress made and goals achieved: Yes  PT Discharge Precautions/Restrictions Precautions Precautions: Fall Restrictions LLE Weight Bearing: Non weight bearing    Cognition Overall Cognitive Status: Impaired Memory: Impaired Memory Impairment: Decreased recall of new information Awareness: Impaired Problem Solving: Impaired Problem Solving Impairment: Functional complex Comments: pt still requiring up to max cues for problem solving and initation. Sensation Sensation Light Touch: Appears Intact (same as admission (LLE in cast and bandage, not assess)) Coordination Gross Motor Movements are Fluid and Coordinated: Yes Motor  Motor Motor: Within Functional Limits    Locomotion  Ambulation Ambulation/Gait Assistance: 4: Min assist   Supervision for w/c mobility due to cues needed for obstacle negotiation and encouragement Trunk/Postural Assessment  Cervical Assessment Cervical Assessment: Within Functional Limits Thoracic Assessment Thoracic Assessment:  (flexed posture) Thoracic Strength Overall Thoracic Strength Comments: Slight thoracic kyphosis in sitting and standing Lumbar Assessment Lumbar Assessment: Within Functional Limits  Balance Static Sitting Balance Static Sitting - Level of Assistance: 6: Modified independent (Device/Increase time) Dynamic Sitting Balance Dynamic Sitting - Level of Assistance: 6: Modified independent (Device/Increase time) Static Standing Balance Static Standing - Level of Assistance: 4: Min assist Dynamic Standing Balance Dynamic Standing - Level of Assistance: 4: Min assist Extremity Assessment      RLE Assessment RLE Assessment: Exceptions to [place] (same as admission) LLE Assessment LLE Assessment: Exceptions to [facility] (grossly 3/5 hip and knee; ankle NT due to cast)  See FIM for current functional status  Karolee Stamps [facility] 09/22/2012, 12:58 PM

## 2012-09-23 DIAGNOSIS — E1165 Type 2 diabetes mellitus with hyperglycemia: Secondary | ICD-10-CM

## 2012-09-23 DIAGNOSIS — W19XXXA Unspecified fall, initial encounter: Secondary | ICD-10-CM

## 2012-09-23 DIAGNOSIS — E1142 Type 2 diabetes mellitus with diabetic polyneuropathy: Secondary | ICD-10-CM

## 2012-09-23 DIAGNOSIS — N186 End stage renal disease: Secondary | ICD-10-CM

## 2012-09-23 DIAGNOSIS — S8263XA Displaced fracture of lateral malleolus of unspecified fibula, initial encounter for closed fracture: Secondary | ICD-10-CM

## 2012-09-23 DIAGNOSIS — S82209A Unspecified fracture of shaft of unspecified tibia, initial encounter for closed fracture: Secondary | ICD-10-CM

## 2012-09-23 LAB — PROTIME-INR: INR: 2.9 — ABNORMAL HIGH (ref 0.00–1.49)

## 2012-09-23 LAB — GLUCOSE, CAPILLARY
Glucose-Capillary: 152 mg/dL — ABNORMAL HIGH (ref 70–99)
Glucose-Capillary: 168 mg/dL — ABNORMAL HIGH (ref 70–99)

## 2012-09-23 MED ORDER — WARFARIN SODIUM 1 MG PO TABS: 1.0000 mg | ORAL_TABLET | Freq: Once | ORAL | Status: DC

## 2012-09-23 MED ORDER — TRAMADOL HCL 50 MG PO TABS
50.0000 mg | ORAL_TABLET | Freq: Three times a day (TID) | ORAL | Status: DC | PRN
Start: 2012-09-23 — End: 2012-11-01

## 2012-09-23 MED ORDER — WARFARIN SODIUM 1 MG PO TABS
1.0000 mg | ORAL_TABLET | Freq: Once | ORAL | Status: AC
  Administered 2012-09-23: 1 mg via ORAL
  Filled 2012-09-23: qty 1

## 2012-09-23 MED ORDER — INSULIN GLARGINE 100 UNIT/ML ~~LOC~~ SOLN: 15.0000 [IU] | Freq: Every day | SUBCUTANEOUS | Status: DC

## 2012-09-23 NOTE — Progress Notes (Signed)
Patient ID: Oscar Castillo, male   DOB: 12/13/1950, 62 y.o.   MRN: [id number] Subjective/Complaints: No complaints. Rested well. Had a better day yesterday A 12 point review of systems has been performed and if not noted above is otherwise negative.   Objective: Vital Signs: Blood pressure 135/88, pulse 87, temperature 98.5 F (36.9 C), temperature source Oral, resp. rate 18, weight 94.2 kg (207 lb 10.8 oz), SpO2 99.00%. No results found.  Recent Labs  09/20/12 1619 09/22/12 1821  WBC 8.9 10.3  HGB 10.0* 10.3*  HCT 30.1* 32.0*  PLT 488* 470*    Recent Labs  09/20/12 1043 09/22/12 1821  NA 131* 130*  K 5.8* 4.7  CL 88* 89*  GLUCOSE 170* 140*  BUN 51* 50*  CREATININE 11.27* 11.48*  CALCIUM 10.7* 10.0   CBG (last 3)   Recent Labs  09/22/12 1638 09/22/12 2302 09/23/12 0716  GLUCAP 166* 153* 122*    Wt Readings from Last 3 Encounters:  09/23/12 94.2 kg (207 lb 10.8 oz)  09/12/12 101.3 kg (223 lb 5.2 oz)  09/12/12 101.3 kg (223 lb 5.2 oz)    Physical Exam:  Constitutional: He appears well-developed and well-nourished. No distress. Slow to arouse HENT:  Head: Normocephalic and atraumatic.  Right Ear: External ear normal.  Left Ear: External ear normal.  Nose: Nose normal.  Mouth/Throat: Oropharynx is clear and moist.  Eyes: Conjunctivae and EOM are normal. Right eye exhibits no discharge. Left eye exhibits no discharge. No scleral icterus.  Pupils round and reactive to light  Neck: Neck supple. No JVD present. No tracheal deviation present. No thyromegaly present.  Cardiovascular: Normal rate and regular rhythm. Exam reveals no gallop and no friction rub.  No murmur heard.  Pulmonary/Chest: Effort normal and breath sounds normal. No respiratory distress. He has no wheezes. He has no rales. He exhibits no tenderness.  Abdominal: Soft. Bowel sounds are normal. He exhibits no distension. There is no tenderness. There is no rebound and no guarding.  Musculoskeletal:   Left leg surgery sites intact. Limb appropriately tender.  Lymphadenopathy:  He has no cervical adenopathy.  Neurological:  He displays normal reflexes. No cranial nerve deficit. Coordination normal.  Alert and appropriate. Better insight and awareness. UE are 5/5. Able to do left SLR Moves toes of left foot. RLE is grossly 4- to 4/5. Stocking glove sensory loss in the feet  Skin: He is not diaphoretic.  Left Leg incisions are dressed. onchomycotic toe nails Psych: affect more dynamic    Assessment/Plan: 1. Functional deficits secondary to right distal tibia shaft fx, right distal fibula fx Stable for D/C today F/u PCP in 1-2 weeks F/u PM&R 3 weeks See D/C summary See D/C instructions FIM: FIM - Bathing Bathing Steps Patient Completed: Chest;Right Arm;Left Arm;Abdomen;Front perineal area;Buttocks;Right upper leg;Left upper leg;Right lower leg (including foot) Bathing: 5: Supervision: Safety issues/verbal cues  FIM - Upper Body Dressing/Undressing Upper body dressing/undressing steps patient completed: Thread/unthread right sleeve of pullover shirt/dresss;Put head through opening of pull over shirt/dress;Thread/unthread left sleeve of pullover shirt/dress;Pull shirt over trunk Upper body dressing/undressing: 7: Complete Independence: No helper FIM - Lower Body Dressing/Undressing Lower body dressing/undressing steps patient completed: Thread/unthread right underwear leg;Thread/unthread left underwear leg;Pull underwear up/down;Thread/unthread right pants leg;Thread/unthread left pants leg;Pull pants up/down;Don/Doff right sock;Don/Doff right shoe;Fasten/unfasten right shoe Lower body dressing/undressing: 5: Supervision: Safety issues/verbal cues  FIM - Toileting Toileting steps completed by patient: Adjust clothing prior to toileting;Performs perineal hygiene;Adjust clothing after toileting Toileting: 5: Supervision: Safety issues/verbal  cues  FIM - Quarry manager Devices: Bedside commode;Elevated toilet seat;Grab bars Toilet Transfers: 5-To toilet/BSC: Supervision (verbal cues/safety issues);5-From toilet/BSC: Supervision (verbal cues/safety issues)  FIM - Press photographer Assistive Devices: Arm rests Bed/Chair Transfer: 5: Supine > Sit: Supervision (verbal cues/safety issues);5: Sit > Supine: Supervision (verbal cues/safety issues);5: Bed > Chair or W/C: Supervision (verbal cues/safety issues);5: Chair or W/C > Bed: Supervision (verbal cues/safety issues)  FIM - Locomotion: Wheelchair Distance: 200 Locomotion: Wheelchair: 5: Travels 150 ft or more: maneuvers on rugs and over door sills with supervision, cueing or coaxing FIM - Locomotion: Ambulation Locomotion: Ambulation Assistive Devices: Designer, industrial/product Ambulation/Gait Assistance: 4: Min assist Locomotion: Ambulation: 1: Travels less than 50 ft with minimal assistance (Pt.>75%)  Comprehension Comprehension Mode: Auditory Comprehension: 4-Understands basic 75 - 89% of the time/requires cueing 10 - 24% of the time  Expression Expression Mode: Verbal Expression: 4-Expresses basic 75 - 89% of the time/requires cueing 10 - 24% of the time. Needs helper to occlude trach/needs to repeat words.  Social Interaction Social Interaction: 4-Interacts appropriately 75 - 89% of the time - Needs redirection for appropriate language or to initiate interaction.  Problem Solving Problem Solving Mode: Asleep Problem Solving: 4-Solves basic 75 - 89% of the time/requires cueing 10 - 24% of the time  Memory Memory: 4-Recognizes or recalls 75 - 89% of the time/requires cueing 10 - 24% of the time  Medical Problem List and Plan:  1. Distal shaft tibia fracture, distal fibula fracture. Status post intramedullary nail, ORIF 09/09/2012  2. DVT Prophylaxis/Anticoagulation: Coumadin for DVT prophylaxis. Continue subcutaneous heparin until INR greater than 2.00. Monitor for  any signs of bleeding.   3. Pain Management: Hydrocodone as needed.--decreased frequency 4. Neuropsych: This patient is capable of making decisions on his/her own behalf.  -mentation close to baseline?  -affect more dynamic 5. Diabetes mellitus with peripheral neuropathy. Hemoglobin A1c 7.8. Currently with sliding scale insulin.   scheduled lantus insulin to 15 units qhs  -overall improved 6. End-stage renal disease. HD late last noc f/u renal outpt HD 7. Hypertension. Norvasc 10 mg daily, labetalol 300 mg twice a day. 8. Chronic anemia. Continue Nulecit weekly with dialysis. Followup CBC with dialysis. Latest hemoglobin 9.7  9. Diastolic congestive heart failure. Following I's and O's, weights.  LOS (Days) 11 A FACE TO FACE EVALUATION WAS PERFORMED  Jaselynn Tamas E 09/23/2012 7:30 AM

## 2012-09-23 NOTE — Progress Notes (Signed)
Social Work  Discharge Note  The overall goal for the admission was met for:   Discharge location: Yes - home with wife and family to provide 24/7 supervision  Length of Stay: Yes - 11 days  Discharge activity level: Yes  Home/community participation: Yes  Services provided included: MD, RD, PT, OT, RN, TR, Pharmacy, Neuropsych and SW  Financial Services: Medicare  Follow-up services arranged: Home Health: RN, PT, OT via [facility] and Other: 18x18 [place] w/c w/ ELRs, cushion via [facility]  Comments (or additional information):  Patient/Family verbalized understanding of follow-up arrangements: Yes  Individual responsible for coordination of the follow-up plan: wife  Confirmed correct DME delivered: Amada Jupiter 09/23/2012    Madelyn Tlatelpa

## 2012-09-23 NOTE — Progress Notes (Signed)
Anticoagulation: Coumadin for VTE px; INR 2.9 from 3.16. 02/25 dose was not charted, likely missed dose while patient was in HD but charted 09/21/12 and 09/22/12 (1mg  dose was given last night). No bleeding noted. Patient very sensitive to coumadin.   Goal of Therapy:  INR 2-3  Plan:  1) coumadin 1mg  po x1

## 2012-09-23 NOTE — Progress Notes (Signed)
Subjective:   Sitting in chair, "I'm going home today"  Objective: Vital signs in last 24 hours: Temp:  [97.6 F (36.4 C)-98.5 F (36.9 C)] 98.5 F (36.9 C) (02/28 0500) Pulse Rate:  [75-88] 87 (02/28 0500) Resp:  [18-19] 18 (02/28 0500) BP: (100-162)/(58-99) 135/88 mmHg (02/28 0500) SpO2:  [99 %-100 %] 99 % (02/28 0500) Weight:  [93.6 kg (206 lb 5.6 oz)-95.8 kg (211 lb 3.2 oz)] 94.2 kg (207 lb 10.8 oz) (02/28 0500) Weight change: 0.8 kg (1 lb 12.2 oz)  Intake/Output from previous day: 02/27 0701 - 02/28 0700 In: 240 [P.O.:240] Out: 2005    EXAM: General appearance:  Alert, in no apparent distress Resp:  CTA without rales, rhonchi, or wheezes Cardio:  RRR without murmur or rub GI:  + BS, soft and nontender Extremities:  No edema, left leg wrapped, toes not ischemic in appearance Access:  AVF @ LUA with + bruit, right IJ catheter  Lab Results:  Recent Labs  09/20/12 1619 09/22/12 1821  WBC 8.9 10.3  HGB 10.0* 10.3*  HCT 30.1* 32.0*  PLT 488* 470*   BMET:   Recent Labs  09/20/12 1043 09/22/12 1821  NA 131* 130*  K 5.8* 4.7  CL 88* 89*  CO2 27 25  GLUCOSE 170* 140*  BUN 51* 50*  CREATININE 11.27* 11.48*  CALCIUM 10.7* 10.0  ALBUMIN 3.4* 3.1*   No results found for this basename: PTH,  in the last 72 hours Iron Studies: No results found for this basename: IRON, TIBC, TRANSFERRIN, FERRITIN,  in the last 72 hours  Dialysis Orders: Center: [place] on TTS.  EDW 99 kg HD Bath 2K/2.25Ca Time 4 hrs Heparin 5000 U. Access Right IJ catheter, also L BVT (placed 06/14/12) BFR 400 DFR 800 Hectorol 6 mcg IV/HD Epogen 0 Units IV/HD Venofer 50 mg on Thurs.   Assessment/Plan: 1. Displaced left tibial fx - secondary to fall, s/p ORIF 2/14 per Dr. Dion Saucier, rehab started 2/17. For discharge home today with home health. 2. ESRD - HD on TTS @ [place]; K 5.8 pre-HD yesterday; used new AVF successfully with 2 needles, will arrange for catheter as outpatient next  week 3. Hypertension/volume - BP 143/85 most recently, on outpatient Amlodipine 10 mg qd and Labetalol 300 mg bid, currently off both; current wt 94.8 kg s/p net UF  3.3 L yesterday with EDW 99 kg. Lower EDW at discharge. 4. Anemia - Hgb 10, Fe sat 12% with ferritin 306 on 2/20; Aranesp 12.5 mcg on Tues and IV Fe qHD. 5. Metabolic bone disease - Ca 10.7 (11.2 corrected), P 5.2; Hectorol 6 mcg, Phoslo 2 with meals.  Use 2Ca bath. 6. Nutrition - Alb 3.4, renal diet. 7. DM - on insulin per primary. 8. Hx CVA - in 07/2009. 9. Hx nephrolithiasis 10. VTE prophylaxis - on short-term Coumadin per pharmacy.  Patient seen and examined.  Agree with assessment and plan as above. Vinson Moselle  MD [phone] pgr    [phone] cell 09/23/2012, 10:06 AM

## 2012-09-23 NOTE — Progress Notes (Signed)
Occupational Therapy Discharge Summary  Patient Details  Name: Oscar Castillo MRN: [id number] Date of Birth: 1950/11/14  Today's Date: 09/23/2012  Patient has met 10 of 10 long term goals due to improved activity tolerance, improved balance, postural control, ability to compensate for deficits, improved attention, improved awareness and improved coordination.  Patient to discharge at overall Supervision level.  Patient continues to present with poor cognition; poor problem solving abilities, poor initiation throughout tasks, poor overall awareness. Patient's care partner is independent to provide the necessary physical, cognitive and supervision assistance at discharge.  Patient's care partner works during the day, but has stated she plans to get hired help when she is not at home/available. Recommending patient have strict 24/7 supervision.   Reasons goals not met: n/a, all goals met at this time.  Recommendation:  Patient will benefit from ongoing skilled OT services in home health setting to continue to advance functional skills in the area of BADL, iADL and Reduce care partner burden.  Equipment: No equipment provided, patient states he has a BSC and tub transfer bench at home.   Reasons for discharge: treatment goals met and discharge from hospital  Patient/family agrees with progress made and goals achieved: Yes  Precautions/Restrictions  Precautions Precautions: Fall Restrictions Weight Bearing Restrictions: Yes LLE Weight Bearing: Non weight bearing  ADL - See FIM  Vision/Perception  Vision - History Baseline Vision: No visual deficits Patient Visual Report: No change from baseline Vision - Assessment Eye Alignment: Within Functional Limits Perception Perception: Within Functional Limits Praxis Praxis: Intact   Cognition Overall Cognitive Status: Impaired Arousal/Alertness: Lethargic Orientation Level: Oriented X4 Focused Attention: Appears intact Memory:  Impaired Memory Impairment: Decreased recall of new information Awareness: Impaired Awareness Impairment: Emergent impairment Problem Solving: Impaired Problem Solving Impairment: Functional complex  Sensation Sensation Additional Comments: bilateral UEs appear intact Coordination Gross Motor Movements are Fluid and Coordinated: Yes Fine Motor Movements are Fluid and Coordinated: Yes  Motor - See Discharge Navigator  Mobility - See Discharge Navigator  Trunk/Postural Assessment - See Discharge Navigator  Balance- See Discharge Navigator  Extremity/Trunk Assessment RUE Assessment RUE Assessment: Within Functional Limits LUE Assessment LUE Assessment: Within Functional Limits  See FIM for current functional status  Philippa Vessey 09/23/2012, 7:55 AM

## 2012-09-24 ENCOUNTER — Other Ambulatory Visit: Payer: Self-pay | Admitting: Internal Medicine

## 2012-09-24 DIAGNOSIS — N2581 Secondary hyperparathyroidism of renal origin: Secondary | ICD-10-CM | POA: Diagnosis not present

## 2012-09-24 DIAGNOSIS — Z23 Encounter for immunization: Secondary | ICD-10-CM | POA: Diagnosis not present

## 2012-09-24 DIAGNOSIS — D689 Coagulation defect, unspecified: Secondary | ICD-10-CM | POA: Diagnosis not present

## 2012-09-24 DIAGNOSIS — D509 Iron deficiency anemia, unspecified: Secondary | ICD-10-CM | POA: Diagnosis not present

## 2012-09-24 DIAGNOSIS — Z992 Dependence on renal dialysis: Secondary | ICD-10-CM | POA: Diagnosis not present

## 2012-09-24 DIAGNOSIS — N186 End stage renal disease: Secondary | ICD-10-CM | POA: Diagnosis not present

## 2012-09-24 DIAGNOSIS — D631 Anemia in chronic kidney disease: Secondary | ICD-10-CM | POA: Diagnosis not present

## 2012-09-25 DIAGNOSIS — I509 Heart failure, unspecified: Secondary | ICD-10-CM | POA: Diagnosis not present

## 2012-09-26 DIAGNOSIS — N189 Chronic kidney disease, unspecified: Secondary | ICD-10-CM | POA: Diagnosis not present

## 2012-09-26 NOTE — Telephone Encounter (Signed)
Pt seen 11/04/11. NO current a1c. Ok to fill?

## 2012-09-26 NOTE — Telephone Encounter (Signed)
Pt advised med was refilled. Refused to make an appt at this time.

## 2012-09-26 NOTE — Telephone Encounter (Signed)
rx done  Please contact pt - due for ROV

## 2012-09-28 DIAGNOSIS — S8290XD Unspecified fracture of unspecified lower leg, subsequent encounter for closed fracture with routine healing: Secondary | ICD-10-CM | POA: Diagnosis not present

## 2012-10-03 ENCOUNTER — Encounter: Payer: Self-pay | Admitting: Internal Medicine

## 2012-10-03 ENCOUNTER — Ambulatory Visit (INDEPENDENT_AMBULATORY_CARE_PROVIDER_SITE_OTHER): Payer: Medicare Other | Admitting: Internal Medicine

## 2012-10-03 VITALS — BP 118/82 | HR 83 | Temp 98.3°F | Ht 69.5 in | Wt 187.0 lb

## 2012-10-03 DIAGNOSIS — I509 Heart failure, unspecified: Secondary | ICD-10-CM | POA: Diagnosis not present

## 2012-10-03 DIAGNOSIS — N189 Chronic kidney disease, unspecified: Secondary | ICD-10-CM | POA: Diagnosis not present

## 2012-10-03 DIAGNOSIS — B1920 Unspecified viral hepatitis C without hepatic coma: Secondary | ICD-10-CM | POA: Diagnosis not present

## 2012-10-03 DIAGNOSIS — E785 Hyperlipidemia, unspecified: Secondary | ICD-10-CM | POA: Diagnosis not present

## 2012-10-03 DIAGNOSIS — S8290XD Unspecified fracture of unspecified lower leg, subsequent encounter for closed fracture with routine healing: Secondary | ICD-10-CM | POA: Diagnosis not present

## 2012-10-03 MED ORDER — INSULIN GLARGINE 100 UNIT/ML ~~LOC~~ SOLN: 15.0000 [IU] | Freq: Every day | SUBCUTANEOUS | Status: DC

## 2012-10-03 MED ORDER — LABETALOL HCL 100 MG PO TABS: ORAL_TABLET | ORAL | Status: DC

## 2012-10-03 NOTE — Assessment & Plan Note (Signed)
stable overall by history and exam, recent data reviewed with pt, and pt to continue medical treatment as before,  to f/u any worsening symptoms or concerns Lab Results  Component Value Date   HGBA1C 7.8* 09/11/2012

## 2012-10-03 NOTE — Progress Notes (Signed)
Subjective:    Patient ID: Oscar Castillo, male    DOB: March 24, 1951, 62 y.o.   MRN: [id number]  HPI  Here to f/u, overall doing well after recent hospn feb 13 with leg fx, then subsequent inpt rehab stay;  Has f/u planned with ortho/dr Landau soon, and cont's with dialysis t-th-sat;   Pt denies polydipsia, polyuria, or low sugar symptoms such as weakness or confusion improved with po intake.  Pt states overall good compliance with meds, trying to follow lower cholesterol, diabetic diet, but pt would like lantus vial changed to solostar pen he has used before.  Pt denies chest pain, increased sob or doe, wheezing, orthopnea, PND, increased LE swelling, palpitations, dizziness or syncope. Pt denies new neurological symptoms such as new headache, or facial or extremity weakness or numbness  No overt bleeding or bruising on the coumadin with planned stop mar 14.  Recent INR adequate.  Overall good compliance with treatment, and good medicine tolerability.  BP has been increased at home recently after d/c from inpt rehab, and after call to after hrs MD, was advised to re-start his labetolol 300  - 1/2 bid, with BP in past 2-3 days much improved.   Pt denies fever, wt loss, night sweats, loss of appetite, or other constitutional symptoms, except still some generalized weakness. Past Medical History  Diagnosis Date  . ALLERGIC RHINITIS 05/05/2007  . BACK PAIN, LUMBAR, CHRONIC 08/06/2009  . BENIGN PROSTATIC HYPERTROPHY 08/01/2010  . CEREBROVASCULAR ACCIDENT, HX OF 08/06/2009  . CHOLELITHIASIS 08/01/2010  . CONGESTIVE HEART FAILURE 03/18/2009  . DEPRESSION 03/18/2009  . DIABETES MELLITUS, TYPE II 03/25/2007  . ERECTILE DYSFUNCTION 03/25/2007  . GERD 03/25/2007  . HEPATITIS C, HX OF 03/25/2007  . HYPERTENSION 03/25/2007  . Morbid obesity 03/25/2007  . NEPHROLITHIASIS, HX OF 03/18/2009  . ESRD on hemodialysis 09/08/2012    ESRD due to DM/HTN. Started dialysis in November 2013. Has LUA AVF placed Jun 14 2012 which is  maturing as of Feb 2014.    Past Surgical History  Procedure Laterality Date  . Nephrectomy      partial RR  . Av fistula placement  06/14/2012    Procedure: ARTERIOVENOUS (AV) FISTULA CREATION;  Surgeon: Chuck Hint, MD;  Location[facility] OR;  Service: Vascular;  Laterality: Left;  Left basilic vein transposition with fistula.  . Tibia im nail insertion Left 09/09/2012    Procedure: INTRAMEDULLARY (IM) NAIL TIBIAL;  Surgeon: Eulas Post, MD;  Location: [facility] OR;  Service: Orthopedics;  Laterality: Left;  left tibial nail and open reduction internal fixation left fibula fracture  . Orif fibula fracture Left 09/09/2012    Procedure: OPEN REDUCTION INTERNAL FIXATION (ORIF) FIBULA FRACTURE;  Surgeon: Eulas Post, MD;  Location: [facility] OR;  Service: Orthopedics;  Laterality: Left;    reports that he has never smoked. He does not have any smokeless tobacco history on file. He reports that he does not drink alcohol or use illicit drugs. family history includes Coronary artery disease in his other and Diabetes in his other. No Known Allergies Current Outpatient Prescriptions on File Prior to Visit  Medication Sig Dispense Refill  . acetaminophen (TYLENOL) 325 MG tablet Take 2 tablets (650 mg total) by mouth every 6 (six) hours as needed.      [name] aspirin 81 MG EC tablet Take 81 mg by mouth daily.        . Brinzolamide-Brimonidine (SIMBRINZA) 1-0.2 % SUSP Place 1 drop into the right eye 2 (two)  times daily.      . calcium acetate (PHOSLO) 667 MG capsule Take 2 capsules (1,334 mg total) by mouth 3 (three) times daily with meals.  180 capsule  0  . pantoprazole (PROTONIX) 40 MG tablet Take 1 tablet (40 mg total) by mouth daily.      . polyethylene glycol (MIRALAX / GLYCOLAX) packet Take 17 g by mouth daily as needed.  14 each    . sennosides-docusate sodium (SENOKOT-S) 8.6-50 MG tablet Take 1 tablet by mouth daily.  30 tablet  1  . traMADol (ULTRAM) 50 MG tablet Take 1 tablet (50 mg total) by mouth  every 8 (eight) hours as needed.  90 tablet  0  . Travoprost, BAK Free, (TRAVATAN) 0.004 % SOLN ophthalmic solution Place 1 drop into both eyes at bedtime.      [name] warfarin (COUMADIN) 1 MG tablet Take 1 tablet (1 mg total) by mouth one time only at 6 PM.  15 tablet     No current facility-administered medications on file prior to visit.   Review of Systems  Constitutional: Negative for unexpected weight change, or unusual diaphoresis  HENT: Negative for tinnitus.   Eyes: Negative for photophobia and visual disturbance.  Respiratory: Negative for choking and stridor.   Gastrointestinal: Negative for vomiting and blood in stool.  Genitourinary: Negative for hematuria and decreased urine volume.  Musculoskeletal: Negative for acute joint swelling Skin: Negative for color change and wound.  Neurological: Negative for tremors and numbness other than noted  Psychiatric/Behavioral: Negative for decreased concentration or  hyperactivity.       Objective:   Physical Exam BP 118/82  Pulse 83  Temp(Src) 98.3 F (36.8 C) (Oral)  Ht 5' 9.5" (1.765 m)  Wt 187 lb (84.823 kg)  BMI 27.23 kg/m2  SpO2 96% VS noted,  Constitutional: Pt appears well-developed and well-nourished.  HENT: Head: NCAT.  Right Ear: External ear normal.  Left Ear: External ear normal.  Eyes: Conjunctivae and EOM are normal. Pupils are equal, round, and reactive to light.  Neck: Normal range of motion. Neck supple.  Cardiovascular: Normal rate and regular rhythm.   Pulmonary/Chest: Effort normal and breath sounds normal.  Abd:  Soft, NT, non-distended, + BS Neurological: Pt is alert. Not confused  Skin: Skin is warm. No erythema.  Psychiatric: Pt behavior is normal. Thought content normal.     Assessment & Plan:

## 2012-10-03 NOTE — Patient Instructions (Addendum)
Please continue all other medications as before, and refills have been done if requested, including the labetalol and lantus solostart pen No further lab work or evaluation needs done today Please keep your appointments with your specialists as you have planned - orthopedic, rehab and dialysis t-th-sat Thank you for enrolling in MyChart. Please follow the instructions below to securely access your online medical record. MyChart allows you to send messages to your doctor, view your test results, renew your prescriptions, schedule appointments, and more. To Log into My Chart online, please go by [employer] or [employer] to [email or web address], or download the MyChart App from the [employer] of [employer] .  Your Username is: [email or web address] (pass thunder) Please send a Engineer, water on Mychart later today. Please return in 6 months, or sooner if needed

## 2012-10-03 NOTE — Assessment & Plan Note (Signed)
stable overall by history and exam, recent data reviewed with pt, and pt to continue medical treatment as before,  to f/u any worsening symptoms or concerns Lab Results  Component Value Date   LDLCALC 104* 06/12/2012

## 2012-10-03 NOTE — Assessment & Plan Note (Signed)
For ortho f/u, and cont coumadin post surg to mar 14

## 2012-10-03 NOTE — Assessment & Plan Note (Signed)
stable overall by history and exam, recent data reviewed with pt, and pt to continue medical treatment as before,  to f/u any worsening symptoms or concerns BP Readings from Last 3 Encounters:  10/03/12 118/82  09/23/12 134/87  09/12/12 136/71

## 2012-10-05 DIAGNOSIS — I509 Heart failure, unspecified: Secondary | ICD-10-CM | POA: Diagnosis not present

## 2012-10-14 DIAGNOSIS — I509 Heart failure, unspecified: Secondary | ICD-10-CM | POA: Diagnosis not present

## 2012-10-14 DIAGNOSIS — N189 Chronic kidney disease, unspecified: Secondary | ICD-10-CM | POA: Diagnosis not present

## 2012-10-14 DIAGNOSIS — I5032 Chronic diastolic (congestive) heart failure: Secondary | ICD-10-CM | POA: Diagnosis not present

## 2012-10-14 DIAGNOSIS — S8290XD Unspecified fracture of unspecified lower leg, subsequent encounter for closed fracture with routine healing: Secondary | ICD-10-CM | POA: Diagnosis not present

## 2012-10-14 DIAGNOSIS — B1920 Unspecified viral hepatitis C without hepatic coma: Secondary | ICD-10-CM | POA: Diagnosis not present

## 2012-10-14 DIAGNOSIS — E1165 Type 2 diabetes mellitus with hyperglycemia: Secondary | ICD-10-CM | POA: Diagnosis not present

## 2012-10-21 DIAGNOSIS — B1920 Unspecified viral hepatitis C without hepatic coma: Secondary | ICD-10-CM | POA: Diagnosis not present

## 2012-10-21 DIAGNOSIS — E1129 Type 2 diabetes mellitus with other diabetic kidney complication: Secondary | ICD-10-CM | POA: Diagnosis not present

## 2012-10-21 DIAGNOSIS — S8290XD Unspecified fracture of unspecified lower leg, subsequent encounter for closed fracture with routine healing: Secondary | ICD-10-CM | POA: Diagnosis not present

## 2012-10-21 DIAGNOSIS — I509 Heart failure, unspecified: Secondary | ICD-10-CM | POA: Diagnosis not present

## 2012-10-21 DIAGNOSIS — I5032 Chronic diastolic (congestive) heart failure: Secondary | ICD-10-CM | POA: Diagnosis not present

## 2012-10-21 DIAGNOSIS — N189 Chronic kidney disease, unspecified: Secondary | ICD-10-CM | POA: Diagnosis not present

## 2012-10-24 DIAGNOSIS — E1165 Type 2 diabetes mellitus with hyperglycemia: Secondary | ICD-10-CM | POA: Diagnosis not present

## 2012-10-24 DIAGNOSIS — N186 End stage renal disease: Secondary | ICD-10-CM | POA: Diagnosis not present

## 2012-10-24 DIAGNOSIS — I5032 Chronic diastolic (congestive) heart failure: Secondary | ICD-10-CM | POA: Diagnosis not present

## 2012-10-24 DIAGNOSIS — N189 Chronic kidney disease, unspecified: Secondary | ICD-10-CM | POA: Diagnosis not present

## 2012-10-24 DIAGNOSIS — I509 Heart failure, unspecified: Secondary | ICD-10-CM | POA: Diagnosis not present

## 2012-10-24 DIAGNOSIS — B1920 Unspecified viral hepatitis C without hepatic coma: Secondary | ICD-10-CM | POA: Diagnosis not present

## 2012-10-24 DIAGNOSIS — S8290XD Unspecified fracture of unspecified lower leg, subsequent encounter for closed fracture with routine healing: Secondary | ICD-10-CM | POA: Diagnosis not present

## 2012-10-26 DIAGNOSIS — S8290XD Unspecified fracture of unspecified lower leg, subsequent encounter for closed fracture with routine healing: Secondary | ICD-10-CM | POA: Diagnosis not present

## 2012-10-26 DIAGNOSIS — B1920 Unspecified viral hepatitis C without hepatic coma: Secondary | ICD-10-CM | POA: Diagnosis not present

## 2012-10-26 DIAGNOSIS — I5032 Chronic diastolic (congestive) heart failure: Secondary | ICD-10-CM | POA: Diagnosis not present

## 2012-10-26 DIAGNOSIS — E1165 Type 2 diabetes mellitus with hyperglycemia: Secondary | ICD-10-CM | POA: Diagnosis not present

## 2012-10-26 DIAGNOSIS — N189 Chronic kidney disease, unspecified: Secondary | ICD-10-CM | POA: Diagnosis not present

## 2012-10-26 DIAGNOSIS — I509 Heart failure, unspecified: Secondary | ICD-10-CM | POA: Diagnosis not present

## 2012-10-27 ENCOUNTER — Inpatient Hospital Stay (HOSPITAL_COMMUNITY)
Admission: EM | Admit: 2012-10-27 | Discharge: 2012-10-30 | DRG: 393 | Disposition: A | Discharge status: Home / Self Care | Payer: Medicare Other | Attending: Internal Medicine | Admitting: Internal Medicine

## 2012-10-27 ENCOUNTER — Encounter (HOSPITAL_COMMUNITY): Payer: Self-pay | Admitting: *Deleted

## 2012-10-27 ENCOUNTER — Emergency Department (HOSPITAL_COMMUNITY): Payer: Medicare Other

## 2012-10-27 DIAGNOSIS — I120 Hypertensive chronic kidney disease with stage 5 chronic kidney disease or end stage renal disease: Secondary | ICD-10-CM | POA: Diagnosis not present

## 2012-10-27 DIAGNOSIS — B1920 Unspecified viral hepatitis C without hepatic coma: Secondary | ICD-10-CM | POA: Diagnosis present

## 2012-10-27 DIAGNOSIS — N19 Unspecified kidney failure: Secondary | ICD-10-CM | POA: Diagnosis not present

## 2012-10-27 DIAGNOSIS — I509 Heart failure, unspecified: Secondary | ICD-10-CM | POA: Diagnosis present

## 2012-10-27 DIAGNOSIS — K922 Gastrointestinal hemorrhage, unspecified: Secondary | ICD-10-CM | POA: Diagnosis not present

## 2012-10-27 DIAGNOSIS — Z7982 Long term (current) use of aspirin: Secondary | ICD-10-CM

## 2012-10-27 DIAGNOSIS — Z79899 Other long term (current) drug therapy: Secondary | ICD-10-CM

## 2012-10-27 DIAGNOSIS — K6289 Other specified diseases of anus and rectum: Secondary | ICD-10-CM | POA: Diagnosis present

## 2012-10-27 DIAGNOSIS — Z905 Acquired absence of kidney: Secondary | ICD-10-CM

## 2012-10-27 DIAGNOSIS — N2581 Secondary hyperparathyroidism of renal origin: Secondary | ICD-10-CM | POA: Diagnosis present

## 2012-10-27 DIAGNOSIS — K5900 Constipation, unspecified: Secondary | ICD-10-CM | POA: Diagnosis present

## 2012-10-27 DIAGNOSIS — D62 Acute posthemorrhagic anemia: Secondary | ICD-10-CM | POA: Diagnosis present

## 2012-10-27 DIAGNOSIS — Z794 Long term (current) use of insulin: Secondary | ICD-10-CM

## 2012-10-27 DIAGNOSIS — Z7901 Long term (current) use of anticoagulants: Secondary | ICD-10-CM

## 2012-10-27 DIAGNOSIS — K644 Residual hemorrhoidal skin tags: Principal | ICD-10-CM | POA: Diagnosis present

## 2012-10-27 DIAGNOSIS — E1129 Type 2 diabetes mellitus with other diabetic kidney complication: Secondary | ICD-10-CM | POA: Diagnosis present

## 2012-10-27 DIAGNOSIS — D638 Anemia in other chronic diseases classified elsewhere: Secondary | ICD-10-CM | POA: Diagnosis not present

## 2012-10-27 DIAGNOSIS — Z992 Dependence on renal dialysis: Secondary | ICD-10-CM | POA: Diagnosis not present

## 2012-10-27 DIAGNOSIS — E119 Type 2 diabetes mellitus without complications: Secondary | ICD-10-CM | POA: Diagnosis not present

## 2012-10-27 DIAGNOSIS — D500 Iron deficiency anemia secondary to blood loss (chronic): Secondary | ICD-10-CM | POA: Diagnosis not present

## 2012-10-27 DIAGNOSIS — R0602 Shortness of breath: Secondary | ICD-10-CM | POA: Diagnosis not present

## 2012-10-27 DIAGNOSIS — R198 Other specified symptoms and signs involving the digestive system and abdomen: Secondary | ICD-10-CM

## 2012-10-27 DIAGNOSIS — I110 Hypertensive heart disease with heart failure: Secondary | ICD-10-CM | POA: Diagnosis present

## 2012-10-27 DIAGNOSIS — I5042 Chronic combined systolic (congestive) and diastolic (congestive) heart failure: Secondary | ICD-10-CM | POA: Diagnosis present

## 2012-10-27 DIAGNOSIS — K219 Gastro-esophageal reflux disease without esophagitis: Secondary | ICD-10-CM | POA: Diagnosis present

## 2012-10-27 DIAGNOSIS — N400 Enlarged prostate without lower urinary tract symptoms: Secondary | ICD-10-CM | POA: Diagnosis present

## 2012-10-27 DIAGNOSIS — Z8679 Personal history of other diseases of the circulatory system: Secondary | ICD-10-CM

## 2012-10-27 DIAGNOSIS — E1122 Type 2 diabetes mellitus with diabetic chronic kidney disease: Secondary | ICD-10-CM | POA: Diagnosis present

## 2012-10-27 DIAGNOSIS — E875 Hyperkalemia: Secondary | ICD-10-CM | POA: Diagnosis present

## 2012-10-27 DIAGNOSIS — D631 Anemia in chronic kidney disease: Secondary | ICD-10-CM | POA: Diagnosis not present

## 2012-10-27 DIAGNOSIS — I517 Cardiomegaly: Secondary | ICD-10-CM | POA: Diagnosis present

## 2012-10-27 DIAGNOSIS — N186 End stage renal disease: Secondary | ICD-10-CM | POA: Diagnosis present

## 2012-10-27 DIAGNOSIS — Z8673 Personal history of transient ischemic attack (TIA), and cerebral infarction without residual deficits: Secondary | ICD-10-CM | POA: Diagnosis not present

## 2012-10-27 DIAGNOSIS — R933 Abnormal findings on diagnostic imaging of other parts of digestive tract: Secondary | ICD-10-CM | POA: Diagnosis not present

## 2012-10-27 DIAGNOSIS — I5043 Acute on chronic combined systolic (congestive) and diastolic (congestive) heart failure: Secondary | ICD-10-CM | POA: Diagnosis present

## 2012-10-27 DIAGNOSIS — B182 Chronic viral hepatitis C: Secondary | ICD-10-CM | POA: Diagnosis present

## 2012-10-27 DIAGNOSIS — R6889 Other general symptoms and signs: Secondary | ICD-10-CM | POA: Diagnosis not present

## 2012-10-27 DIAGNOSIS — K625 Hemorrhage of anus and rectum: Secondary | ICD-10-CM | POA: Diagnosis not present

## 2012-10-27 DIAGNOSIS — K921 Melena: Secondary | ICD-10-CM | POA: Diagnosis not present

## 2012-10-27 DIAGNOSIS — K648 Other hemorrhoids: Secondary | ICD-10-CM | POA: Diagnosis not present

## 2012-10-27 LAB — RENAL FUNCTION PANEL
CO2: 21 mEq/L (ref 19–32)
Calcium: 8.9 mg/dL (ref 8.4–10.5)
Creatinine, Ser: 12.68 mg/dL — ABNORMAL HIGH (ref 0.50–1.35)
GFR calc non Af Amer: 4 mL/min — ABNORMAL LOW (ref >90)

## 2012-10-27 LAB — COMPREHENSIVE METABOLIC PANEL
ALT: 9 U/L (ref 0–53)
AST: 14 U/L (ref 0–37)
CO2: 25 mEq/L (ref 19–32)
Chloride: 98 mEq/L (ref 96–112)
GFR calc non Af Amer: 4 mL/min — ABNORMAL LOW (ref >90)
Sodium: 136 mEq/L (ref 135–145)
Total Bilirubin: 0.2 mg/dL — ABNORMAL LOW (ref 0.3–1.2)

## 2012-10-27 LAB — ABO/RH: ABO/RH(D): O POS

## 2012-10-27 LAB — CBC
MCH: 27.1 pg (ref 26.0–34.0)
MCHC: 33.1 g/dL (ref 30.0–36.0)
Platelets: 449 10*3/uL — ABNORMAL HIGH (ref 150–400)
RDW: 18.1 % — ABNORMAL HIGH (ref 11.5–15.5)

## 2012-10-27 LAB — GLUCOSE, CAPILLARY

## 2012-10-27 MED ORDER — PANTOPRAZOLE SODIUM 40 MG IV SOLR
40.0000 mg | Freq: Two times a day (BID) | INTRAVENOUS | Status: DC
  Administered 2012-10-28: 40 mg via INTRAVENOUS
  Filled 2012-10-27 (×2): qty 40

## 2012-10-27 MED ORDER — SODIUM CHLORIDE 0.9 % IV SOLN
125.0000 mg | INTRAVENOUS | Status: DC
  Administered 2012-10-27 – 2012-10-29 (×2): 125 mg via INTRAVENOUS
  Filled 2012-10-27 (×5): qty 10

## 2012-10-27 MED ORDER — ONDANSETRON HCL 4 MG PO TABS: 4.0000 mg | ORAL_TABLET | Freq: Four times a day (QID) | ORAL | Status: DC | PRN

## 2012-10-27 MED ORDER — INSULIN ASPART 100 UNIT/ML ~~LOC~~ SOLN
0.0000 [IU] | Freq: Three times a day (TID) | SUBCUTANEOUS | Status: DC
  Administered 2012-10-29: 1 [IU] via SUBCUTANEOUS
  Administered 2012-10-30: 2 [IU] via SUBCUTANEOUS
  Administered 2012-10-30: 1 [IU] via SUBCUTANEOUS

## 2012-10-27 MED ORDER — RENA-VITE PO TABS
1.0000 | ORAL_TABLET | Freq: Every day | ORAL | Status: DC
  Administered 2012-10-29: 1 via ORAL
  Filled 2012-10-27 (×5): qty 1

## 2012-10-27 MED ORDER — DARBEPOETIN ALFA-POLYSORBATE 100 MCG/0.5ML IJ SOLN
100.0000 ug | INTRAMUSCULAR | Status: DC
Start: 2012-11-03 — End: 2012-10-28

## 2012-10-27 MED ORDER — SODIUM CHLORIDE 0.9 % IV BOLUS (SEPSIS): 1000.0000 mL | Freq: Once | INTRAVENOUS | Status: DC

## 2012-10-27 MED ORDER — DARBEPOETIN ALFA-POLYSORBATE 40 MCG/0.4ML IJ SOLN
40.0000 ug | INTRAMUSCULAR | Status: DC
  Filled 2012-10-27: qty 0.4

## 2012-10-27 MED ORDER — DOXERCALCIFEROL 4 MCG/2ML IV SOLN
6.0000 ug | INTRAVENOUS | Status: DC
  Administered 2012-10-27: 6 ug via INTRAVENOUS
  Filled 2012-10-27 (×2): qty 4

## 2012-10-27 MED ORDER — SODIUM CHLORIDE 0.9 % IV SOLN
INTRAVENOUS | Status: DC
  Administered 2012-10-28: 250 mL via INTRAVENOUS

## 2012-10-27 MED ORDER — IOHEXOL 300 MG/ML  SOLN
25.0000 mL | INTRAMUSCULAR | Status: AC
  Administered 2012-10-27: 25 mL via ORAL

## 2012-10-27 MED ORDER — ONDANSETRON HCL 4 MG/2ML IJ SOLN: 4.0000 mg | Freq: Four times a day (QID) | INTRAMUSCULAR | Status: DC | PRN

## 2012-10-27 MED ORDER — PEG 3350-KCL-NA BICARB-NACL 420 G PO SOLR
4000.0000 mL | Freq: Once | ORAL | Status: AC
  Administered 2012-10-28: 4000 mL via ORAL
  Filled 2012-10-27: qty 4000

## 2012-10-27 NOTE — Consult Note (Signed)
[facility] Renal Consultation Note    Indication for Consultation:  Management of ESRD/hemodialysis; anemia, hypertension/volume and secondary hyperparathyroidism  HPI: Oscar Castillo is a 62 y.o. male ESRD patient (TTS HD) with PMH significant for CVA, diabetes, hepatitis C and HTN who presents to the emergency department complaining of rectal bleeding that started today. Patient states he passed a large amount of dark red clots earlier today during a bowel movement. He denies any pain, HA, SOB, dizziness, diaphoresis, syncope, nausea or vomiting. His spouse indicates that he has been constipated for the past 2 days and symptoms have not been relieved with enemas. He wears a diaper because he underwent leg surgery on 2/14 and is still in a cast, not because of incontinence. She noticed the clots in his diaper today. Patient is a poor historian, somewhat agitated/preoccupied, and will not answer many questions. This history is largely obtained from notes.    He missed his regular dialysis today. Also because of stubbornness, did not go on Tues.  Past Medical History  Diagnosis Date  . ALLERGIC RHINITIS 05/05/2007  . BACK PAIN, LUMBAR, CHRONIC 08/06/2009  . BENIGN PROSTATIC HYPERTROPHY 08/01/2010  . CEREBROVASCULAR ACCIDENT, HX OF 08/06/2009  . CHOLELITHIASIS 08/01/2010  . CONGESTIVE HEART FAILURE 03/18/2009  . DEPRESSION 03/18/2009  . DIABETES MELLITUS, TYPE II 03/25/2007  . ERECTILE DYSFUNCTION 03/25/2007  . GERD 03/25/2007  . HEPATITIS C, HX OF 03/25/2007  . HYPERTENSION 03/25/2007  . Morbid obesity 03/25/2007  . NEPHROLITHIASIS, HX OF 03/18/2009  . ESRD on hemodialysis 09/08/2012    ESRD due to DM/HTN. Started dialysis in November 2013. Has LUA AVF placed Jun 14 2012 which is maturing as of Feb 2014.   [name] Renal insufficiency    Past Surgical History  Procedure Laterality Date  . Nephrectomy      partial RR  . Av fistula placement  06/14/2012    Procedure: ARTERIOVENOUS (AV)  FISTULA CREATION;  Surgeon: Chuck Hint, MD;  Location: [facility] OR;  Service: Vascular;  Laterality: Left;  Left basilic vein transposition with fistula.  . Tibia im nail insertion Left 09/09/2012    Procedure: INTRAMEDULLARY (IM) NAIL TIBIAL;  Surgeon: Eulas Post, MD;  Location: [facility] OR;  Service: Orthopedics;  Laterality: Left;  left tibial nail and open reduction internal fixation left fibula fracture  . Orif fibula fracture Left 09/09/2012    Procedure: OPEN REDUCTION INTERNAL FIXATION (ORIF) FIBULA FRACTURE;  Surgeon: Eulas Post, MD;  Location: [facility] OR;  Service: Orthopedics;  Laterality: Left;   Family History  Problem Relation Age of Onset  . Coronary artery disease Other   . Diabetes Other    Social History:  reports that he has never smoked. He does not have any smokeless tobacco history on file. He reports that he does not drink alcohol or use illicit drugs. No Known Allergies Prior to Admission medications   Medication Sig Start Date End Date Taking? Authorizing Provider  aspirin 81 MG EC tablet Take 81 mg by mouth daily.     Yes Historical Provider, MD  Brinzolamide-Brimonidine [facility]) 1-0.2 % SUSP Place 1 drop into the right eye 2 (two) times daily.   Yes Historical Provider, MD  calcium acetate (PHOSLO) 667 MG capsule Take 2 capsules (1,334 mg total) by mouth 3 (three) times daily with meals. 06/21/12  Yes Sorin Luanne Bras, MD  insulin glargine (LANTUS SOLOSTAR) 100 UNIT/ML injection Inject 15 Units into the skin daily. 10/03/12  Yes Corwin Levins, MD  labetalol (NORMODYNE) 300 MG tablet Take 150 mg by mouth 2 (two) times daily.   Yes Historical Provider, MD  traMADol (ULTRAM) 50 MG tablet Take 1 tablet (50 mg total) by mouth every 8 (eight) hours as needed. 09/23/12  Yes Daniel J Angiulli, PA-C  acetaminophen (TYLENOL) 325 MG tablet Take 2 tablets (650 mg total) by mouth every 6 (six) hours as needed. 09/12/12   Sorin Luanne Bras, MD  pantoprazole (PROTONIX) 40 MG tablet Take 1  tablet (40 mg total) by mouth daily. 09/12/12   Sorin Luanne Bras, MD  polyethylene glycol (MIRALAX / GLYCOLAX) packet Take 17 g by mouth daily as needed. 09/12/12   Sorin Luanne Bras, MD  sennosides-docusate sodium (SENOKOT-S) 8.6-50 MG tablet Take 1 tablet by mouth daily. 09/09/12   Eulas Post, MD  Travoprost, BAK Free, (TRAVATAN) 0.004 % SOLN ophthalmic solution Place 1 drop into both eyes at bedtime.    Historical Provider, MD  warfarin (COUMADIN) 1 MG tablet Take 1 tablet (1 mg total) by mouth one time only at 6 PM. 09/23/12   Mcarthur Rossetti Angiulli, PA-C   Current Facility-Administered Medications  Medication Dose Route Frequency Provider Last Rate Last Dose  . insulin aspart (novoLOG) injection 0-9 Units  0-9 Units Subcutaneous TID WC Belkys A Regalado, MD      . pantoprazole (PROTONIX) injection 40 mg  40 mg Intravenous Q12H Belkys A Regalado, MD       Current Outpatient Prescriptions  Medication Sig Dispense Refill  . aspirin 81 MG EC tablet Take 81 mg by mouth daily.        . Brinzolamide-Brimonidine (SIMBRINZA) 1-0.2 % SUSP Place 1 drop into the right eye 2 (two) times daily.      . calcium acetate (PHOSLO) 667 MG capsule Take 2 capsules (1,334 mg total) by mouth 3 (three) times daily with meals.  180 capsule  0  . insulin glargine (LANTUS SOLOSTAR) 100 UNIT/ML injection Inject 15 Units into the skin daily.  5 pen  PRN  . labetalol (NORMODYNE) 300 MG tablet Take 150 mg by mouth 2 (two) times daily.      . traMADol (ULTRAM) 50 MG tablet Take 1 tablet (50 mg total) by mouth every 8 (eight) hours as needed.  90 tablet  0  . acetaminophen (TYLENOL) 325 MG tablet Take 2 tablets (650 mg total) by mouth every 6 (six) hours as needed.      . pantoprazole (PROTONIX) 40 MG tablet Take 1 tablet (40 mg total) by mouth daily.      . polyethylene glycol (MIRALAX / GLYCOLAX) packet Take 17 g by mouth daily as needed.  14 each    . sennosides-docusate sodium (SENOKOT-S) 8.6-50 MG tablet Take 1 tablet by mouth  daily.  30 tablet  1  . Travoprost, BAK Free, (TRAVATAN) 0.004 % SOLN ophthalmic solution Place 1 drop into both eyes at bedtime.      [name] warfarin (COUMADIN) 1 MG tablet Take 1 tablet (1 mg total) by mouth one time only at 6 PM.  15 tablet     Labs: Basic Metabolic Panel:  Recent Labs Lab 10/27/12 1236  NA 136  K 5.9*  CL 98  CO2 25  GLUCOSE 210*  BUN 52*  CREATININE 12.64*  CALCIUM 9.3   Liver Function Tests:  Recent Labs Lab 10/27/12 1236  AST 14  ALT 9  ALKPHOS 72  BILITOT 0.2*  PROT 7.4  ALBUMIN 2.7*   CBC:  Recent Labs Lab 10/27/12  1236  WBC 7.2  HGB 10.0*  HCT 30.2*  MCV 81.8  PLT 449*   Studies/Results: Ct Abdomen Pelvis Wo Contrast  10/27/2012  *RADIOLOGY REPORT*  Clinical Data: Rectal bleeding  CT ABDOMEN AND PELVIS WITHOUT CONTRAST  Technique:  Multidetector CT imaging of the abdomen and pelvis was performed following the standard protocol without intravenous contrast.  Comparison: CT 11/06/2009  Findings: Lung bases are clear.  Unenhanced images of the liver gallbladder and bile ducts are normal.  Pancreas and spleen are normal.  No kidney stone is identified.  There is no renal mass or obstruction.  No retroperitoneal adenopathy.  Soft tissue mass fills the rectum.  This is concerning for rectal carcinoma.  It is possible this represents stool.  Endoscopy is suggested for further evaluation.  No bowel obstruction.  Appendix is normal.  There is moderate stool in the colon.  Degenerative changes are seen throughout the lumbar spine with multiple Schmorl's nodes.  IMPRESSION: Soft tissue mass fills the rectum, concerning for a rectal carcinoma.  Without intravenous contrast, this is difficult to differentiate from stool.  Endoscopy suggested for further evaluation.   Original Report Authenticated By: Janeece Riggers, M.D.     ROS: +rectal bleeding. 10 pt ROS asked and answered. All other systems negative.   Physical Exam: Filed Vitals:   10/27/12 1209 10/27/12  1338 10/27/12 1521  BP: 119/71 136/87 204/102  Pulse: 78 73 75  Temp: 98.7 F (37.1 C)    TempSrc: Oral    Resp: 18 17   SpO2: 97% 100%      General: Well developed, well nourished, NAD very evasive historian, most of hx from wife Head: Normocephalic, atraumatic, sclera non-icteric, mucus membranes are moist DM retinopathy Neck: Supple. JVD not elevated.PCL Lungs: Clear bilaterally to auscultation without wheezes, rales, or rhonchi. Breathing is unlabored. Diminished bs Heart: RRR with S1 S2. No murmurs, rubs, or gallops appreciated.Gr 2/6 M Abdomen: Soft, non-tender, non-distended with normoactive bowel sounds. No rebound/guarding. No obvious abdominal masses. Liver down 5 cm M-S:  Strength and tone appear normal for age. Lower extremities: No pretibial edema. Left leg in cast  1+ edema Neuro: Alert and oriented X 3. Moves all extremities spontaneously. Psych:  Agitated/preoccupied. Very limited responses to questions. Dialysis Access: Rt I-J Cath, L AVF with + bruit  Adequate size and useable  Dialysis Orders: Center: [place]  on TTS . EDW 94.5 kg HD Bath 2K/2Ca  Time 4:00 Heparin 5000 bolus/ 2500 mid. Access Rt I-J/ L AVF that pt will not allow to be stuck BFR 400 DFR 800 Hectorol 6 mcg IV/HD Epogen 6000   Units IV/HD  Venofer  100 mg IV/HD through 11/15/12 Other: Pt has cast on LLE - EDW above is with cast.  Assessment/Plan: 1. GI Bleed - Blood per rectum x 1 day. CT Abd Pelvis shows possible rectal mass - unable to differentiate from stool without contrast. [facility] consulted. NPO, cycle hgb, and transfuse PRN per admit. 2. ESRD -  TTS - Missed today. K+5.9.  Use AVF, discussed with patient. 3. Hypertension/volume  - BP 119/71 upon admission, now 204/102. Suspect due to agitation associated with hearing his CT results.(Home BP meds are labetolol 300 mg BID and Amlodipine 10 QD). No current inpatient wgt. IDWGs usually 2-3L. Follow post wgt today. 4. Anemia  - Hgb 10 on op Epogen 6000.   Aranesp 100 anticipation of drop while inpatient. Continue IV Fe load through 11/15/12 5. Metabolic bone disease -  Ca 9.3 (  10.3 corrected) P 5.3 on Phoslo 2 TIDAC. Last PTH 242.2 on 3/27. Continue Hectorol 6, order binder once diet advanced. 6. Nutrition - Albumin 2.7 NPO for now.  Renal diet when appropriate. Renal vitamin 7. DM - on insulin 8. Hx of left Tibia Fx - Currently in cast 9. Nonadherence  Claud Kelp, PA-C [facility] Pager [phone] 10/27/2012, 3:47 PM   I have seen and examined this patient and agree with the plan of care with above changes. .  Loel Betancur L 10/27/2012, 5:02 PM

## 2012-10-27 NOTE — ED Notes (Signed)
Pt had lg bloody incontinent stool in depends, pt cleaned.  Wife remains at bedside.

## 2012-10-27 NOTE — Consult Note (Signed)
Reason for Consult: GI bleed abnormal CAT scan Referring Physician: Hospital team  Oscar Castillo is an 62 y.o. male.  HPI: Patient with multiple medical problems seen at the request of the hospital team for bright red blood per rectum and a CAT scan which shows questionable stool versus rectal mass and the history was obtained by both the patient and Oscar Castillo and Oscar had some increased constipation lately which did require some enemas but he Oscar never had a colonoscopy and has never seen blood until today and he is on an aspirin every day but Oscar Coumadin was stopped a few weeks ago which he was taking for DVT prevention because of Oscar broken leg and Oscar family history is negative for any GI problems and he is not having any pain and wants to eat and is dialysis both today and Tuesday but supposedly did go Saturday  Past Medical History  Diagnosis Date  . ALLERGIC RHINITIS 05/05/2007  . BACK PAIN, LUMBAR, CHRONIC 08/06/2009  . BENIGN PROSTATIC HYPERTROPHY 08/01/2010  . CEREBROVASCULAR ACCIDENT, HX OF 08/06/2009  . CHOLELITHIASIS 08/01/2010  . CONGESTIVE HEART FAILURE 03/18/2009  . DEPRESSION 03/18/2009  . DIABETES MELLITUS, TYPE II 03/25/2007  . ERECTILE DYSFUNCTION 03/25/2007  . GERD 03/25/2007  . HEPATITIS C, HX OF 03/25/2007  . HYPERTENSION 03/25/2007  . Morbid obesity 03/25/2007  . NEPHROLITHIASIS, HX OF 03/18/2009  . ESRD on hemodialysis 09/08/2012    ESRD due to DM/HTN. Started dialysis in November 2013. Has LUA AVF placed Jun 14 2012 which is maturing as of Feb 2014.   [name] Renal insufficiency     Past Surgical History  Procedure Laterality Date  . Nephrectomy      partial RR  . Av fistula placement  06/14/2012    Procedure: ARTERIOVENOUS (AV) FISTULA CREATION;  Surgeon: Chuck Hint, MD;  Location: [facility] OR;  Service: Vascular;  Laterality: Left;  Left basilic vein transposition with fistula.  . Tibia im nail insertion Left 09/09/2012    Procedure: INTRAMEDULLARY (IM) NAIL TIBIAL;   Surgeon: Eulas Post, MD;  Location: [facility] OR;  Service: Orthopedics;  Laterality: Left;  left tibial nail and open reduction internal fixation left fibula fracture  . Orif fibula fracture Left 09/09/2012    Procedure: OPEN REDUCTION INTERNAL FIXATION (ORIF) FIBULA FRACTURE;  Surgeon: Eulas Post, MD;  Location: [facility] OR;  Service: Orthopedics;  Laterality: Left;    Family History  Problem Relation Age of Onset  . Coronary artery disease Other   . Diabetes Other     Social History:  reports that he has never smoked. He does not have any smokeless tobacco history on file. He reports that he does not drink alcohol or use illicit drugs.  Allergies: No Known Allergies  Medications: I have reviewed the patient's current medications.  Results for orders placed during the hospital encounter of 10/27/12 (from the past 48 hour(s))  CBC     Status: Abnormal   Collection Time    10/27/12 12:36 PM      Result Value Range   WBC 7.2  4.0 - 10.5 K/uL   RBC 3.69 (*) 4.22 - 5.81 MIL/uL   Hemoglobin 10.0 (*) 13.0 - 17.0 g/dL   HCT 16.1 (*) 09.6 - 04.5 %   MCV 81.8  78.0 - 100.0 fL   MCH 27.1  26.0 - 34.0 pg   MCHC 33.1  30.0 - 36.0 g/dL   RDW 40.9 (*) 81.1 - 91.4 %   Platelets  449 (*) 150 - 400 K/uL  COMPREHENSIVE METABOLIC PANEL     Status: Abnormal   Collection Time    10/27/12 12:36 PM      Result Value Range   Sodium 136  135 - 145 mEq/L   Potassium 5.9 (*) 3.5 - 5.1 mEq/L   Chloride 98  96 - 112 mEq/L   CO2 25  19 - 32 mEq/L   Glucose, Bld 210 (*) 70 - 99 mg/dL   BUN 52 (*) 6 - 23 mg/dL   Creatinine, Ser 16.10 (*) 0.50 - 1.35 mg/dL   Calcium 9.3  8.4 - 96.0 mg/dL   Total Protein 7.4  6.0 - 8.3 g/dL   Albumin 2.7 (*) 3.5 - 5.2 g/dL   AST 14  0 - 37 U/L   ALT 9  0 - 53 U/L   Alkaline Phosphatase 72  39 - 117 U/L   Total Bilirubin 0.2 (*) 0.3 - 1.2 mg/dL   GFR calc non Af Amer 4 (*) >90 mL/min   GFR calc Af Amer 4 (*) >90 mL/min   Comment:            The eGFR has been calculated      using the CKD EPI equation.     This calculation has not been     validated in all clinical     situations.     eGFR's persistently     <90 mL/min signify     possible Chronic Kidney Disease.  PROTIME-INR     Status: None   Collection Time    10/27/12 12:36 PM      Result Value Range   Prothrombin Time 14.0  11.6 - 15.2 seconds   INR 1.09  0.00 - 1.49  TYPE AND SCREEN     Status: None   Collection Time    10/27/12 12:36 PM      Result Value Range   ABO/RH(D) O POS     Antibody Screen NEG     Sample Expiration 10/30/2012    ABO/RH     Status: None   Collection Time    10/27/12 12:36 PM      Result Value Range   ABO/RH(D) O POS    HEMOGLOBIN     Status: Abnormal   Collection Time    10/27/12  3:33 PM      Result Value Range   Hemoglobin 10.0 (*) 13.0 - 17.0 g/dL    Ct Abdomen Pelvis Wo Contrast  10/27/2012  *RADIOLOGY REPORT*  Clinical Data: Rectal bleeding  CT ABDOMEN AND PELVIS WITHOUT CONTRAST  Technique:  Multidetector CT imaging of the abdomen and pelvis was performed following the standard protocol without intravenous contrast.  Comparison: CT 11/06/2009  Findings: Lung bases are clear.  Unenhanced images of the liver gallbladder and bile ducts are normal.  Pancreas and spleen are normal.  No kidney stone is identified.  There is no renal mass or obstruction.  No retroperitoneal adenopathy.  Soft tissue mass fills the rectum.  This is concerning for rectal carcinoma.  It is possible this represents stool.  Endoscopy is suggested for further evaluation.  No bowel obstruction.  Appendix is normal.  There is moderate stool in the colon.  Degenerative changes are seen throughout the lumbar spine with multiple Schmorl's nodes.  IMPRESSION: Soft tissue mass fills the rectum, concerning for a rectal carcinoma.  Without intravenous contrast, this is difficult to differentiate from stool.  Endoscopy suggested for further evaluation.  Original Report Authenticated By: Janeece Riggers,  M.D.     ROS negative except above Blood pressure 204/102, pulse 75, temperature 98.7 F (37.1 C), temperature source Oral, resp. rate 17, SpO2 100.00%. Physical Exam No acute distress lying comfortably in the bed vital signs stable afebrile some obvious bright red blood per rectum abdomen is soft nontender labs and CT reviewed as well as hospital computer Assessment/Plan: Lower GI bleeding abnormal CAT scan in a patient with multiple medical problems Plan: Will allow clear liquids and begin a colon prep and hopefully tomorrow he'll be clean enough to at least do a flexible sigmoidoscopy and the risks benefits methods of flexible sigmoidoscopy and colonoscopy was discussed with both the patient and Oscar Castillo and hopefully he'll get dialysis tonight which should help Oscar clotting some  Zaiyden Strozier E 10/27/2012, 4:58 PM

## 2012-10-27 NOTE — ED Notes (Signed)
Patient states he started with rectal bleeding this am, patient states he has passed large amount of clots, patient denies constipation, patient denies pain,

## 2012-10-27 NOTE — H&P (Addendum)
[facility] History and Physical  Oscar Castillo WRU:[id number] DOB: 1950/08/07 DOA: 10/27/2012  Referring physician: Johnnette Gourd, PA. PCP: Oliver Barre, MD  Specialists: Enid Baas, Nephrologist.   Chief Complaint: blood stool.  HPI: Oscar Castillo is a 62 y.o. male with PMH significant for ESRD on hemodialysis, Diabetes, CHF, hepatitis C, who presents to ED complaining of red blood in the stool. He was ready to go to dialysis today when he notice significant amount of blood in the stool. He say around a cup of blood. He denies any abdominal pain or any prior episodes. He denies nausea, vomiting, hematemesis. No family history of colon cancer. He has never had a colonoscopy or endoscopy. He had a CT scan abdomen with result as follow: Soft tissue mass fills the rectum, concerning for a rectal  carcinoma. Without intravenous contrast, this is difficult to differentiate from stool. Endoscopy suggested for further  Evaluation. GI and nephrologist already consulted.    Review of Systems: The patient denies anorexia, fever, weight loss,, vision loss, decreased hearing, hoarseness, chest pain, syncope, dyspnea on exertion, peripheral edema, balance deficits, hemoptysis, abdominal pain,  severe indigestion/heartburn, hematuria, incontinence, genital sores, muscle weakness, suspicious skin lesions, transient blindness,  Past Medical History  Diagnosis Date  . ALLERGIC RHINITIS 05/05/2007  . BACK PAIN, LUMBAR, CHRONIC 08/06/2009  . BENIGN PROSTATIC HYPERTROPHY 08/01/2010  . CEREBROVASCULAR ACCIDENT, HX OF 08/06/2009  . CHOLELITHIASIS 08/01/2010  . CONGESTIVE HEART FAILURE 03/18/2009  . DEPRESSION 03/18/2009  . DIABETES MELLITUS, TYPE II 03/25/2007  . ERECTILE DYSFUNCTION 03/25/2007  . GERD 03/25/2007  . HEPATITIS C, HX OF 03/25/2007  . HYPERTENSION 03/25/2007  . Morbid obesity 03/25/2007  . NEPHROLITHIASIS, HX OF 03/18/2009  . ESRD on hemodialysis 09/08/2012    ESRD due to DM/HTN. Started dialysis in  November 2013. Has LUA AVF placed Jun 14 2012 which is maturing as of Feb 2014.   [name] Renal insufficiency    Past Surgical History  Procedure Laterality Date  . Nephrectomy      partial RR  . Av fistula placement  06/14/2012    Procedure: ARTERIOVENOUS (AV) FISTULA CREATION;  Surgeon: Chuck Hint, MD;  Location: [facility] OR;  Service: Vascular;  Laterality: Left;  Left basilic vein transposition with fistula.  . Tibia im nail insertion Left 09/09/2012    Procedure: INTRAMEDULLARY (IM) NAIL TIBIAL;  Surgeon: Eulas Post, MD;  Location: [facility] OR;  Service: Orthopedics;  Laterality: Left;  left tibial nail and open reduction internal fixation left fibula fracture  . Orif fibula fracture Left 09/09/2012    Procedure: OPEN REDUCTION INTERNAL FIXATION (ORIF) FIBULA FRACTURE;  Surgeon: Eulas Post, MD;  Location: [facility] OR;  Service: Orthopedics;  Laterality: Left;   Social History:  reports that he has never smoked. He does not have any smokeless tobacco history on file. He reports that he does not drink alcohol or use illicit drugs.  No Known Allergies  Family History  Problem Relation Age of Onset  . Coronary artery disease Other   . Diabetes Other     Prior to Admission medications   Medication Sig Start Date End Date Taking? Authorizing Provider  aspirin 81 MG EC tablet Take 81 mg by mouth daily.     Yes Historical Provider, MD  Brinzolamide-Brimonidine [facility]) 1-0.2 % SUSP Place 1 drop into the right eye 2 (two) times daily.   Yes Historical Provider, MD  calcium acetate (PHOSLO) 667 MG capsule Take 2 capsules (1,334 mg total) by mouth  3 (three) times daily with meals. 06/21/12  Yes Sorin Luanne Bras, MD  insulin glargine (LANTUS SOLOSTAR) 100 UNIT/ML injection Inject 15 Units into the skin daily. 10/03/12  Yes Corwin Levins, MD  labetalol (NORMODYNE) 300 MG tablet Take 150 mg by mouth 2 (two) times daily.   Yes Historical Provider, MD  traMADol (ULTRAM) 50 MG tablet Take 1 tablet (50 mg  total) by mouth every 8 (eight) hours as needed. 09/23/12  Yes Daniel J Angiulli, PA-C  acetaminophen (TYLENOL) 325 MG tablet Take 2 tablets (650 mg total) by mouth every 6 (six) hours as needed. 09/12/12   Sorin Luanne Bras, MD  pantoprazole (PROTONIX) 40 MG tablet Take 1 tablet (40 mg total) by mouth daily. 09/12/12   Sorin Luanne Bras, MD  polyethylene glycol (MIRALAX / GLYCOLAX) packet Take 17 g by mouth daily as needed. 09/12/12   Sorin Luanne Bras, MD  sennosides-docusate sodium (SENOKOT-S) 8.6-50 MG tablet Take 1 tablet by mouth daily. 09/09/12   Eulas Post, MD  Travoprost, BAK Free, (TRAVATAN) 0.004 % SOLN ophthalmic solution Place 1 drop into both eyes at bedtime.    Historical Provider, MD  warfarin (COUMADIN) 1 MG tablet Take 1 tablet (1 mg total) by mouth one time only at 6 PM. 09/23/12   Charlton Amor, PA-C   Physical Exam: Filed Vitals:   10/27/12 1209 10/27/12 1338 10/27/12 1521  BP: 119/71 136/87 204/102  Pulse: 78 73 75  Temp: 98.7 F (37.1 C)    TempSrc: Oral    Resp: 18 17   SpO2: 97% 100%      General:  No distress.   Eyes: PERLA.  ENT: no nasal drainage, no tonsillar erythema.  Neck: Supple, no thyromegaly  Cardiovascular: S 1, S 2 RRR  Respiratory: CTA  Abdomen: BS present, soft, obese, NT  Skin: no rashes.   Musculoskeletal: Left LE with cast.   Psychiatric: flat affect.   Neurologic: Non focal.   Labs on Admission:  Basic Metabolic Panel:  Recent Labs Lab 10/27/12 1236  NA 136  K 5.9*  CL 98  CO2 25  GLUCOSE 210*  BUN 52*  CREATININE 12.64*  CALCIUM 9.3   Liver Function Tests:  Recent Labs Lab 10/27/12 1236  AST 14  ALT 9  ALKPHOS 72  BILITOT 0.2*  PROT 7.4  ALBUMIN 2.7*   No results found for this basename: LIPASE, AMYLASE,  in the last 168 hours No results found for this basename: AMMONIA,  in the last 168 hours CBC:  Recent Labs Lab 10/27/12 1236  WBC 7.2  HGB 10.0*  HCT 30.2*  MCV 81.8  PLT 449*   Cardiac Enzymes: No  results found for this basename: CKTOTAL, CKMB, CKMBINDEX, TROPONINI,  in the last 168 hours  BNP (last 3 results)  Recent Labs  06/11/12 0916  PROBNP 6937.0*   CBG: No results found for this basename: GLUCAP,  in the last 168 hours  Radiological Exams on Admission: Ct Abdomen Pelvis Wo Contrast  10/27/2012  *RADIOLOGY REPORT*  Clinical Data: Rectal bleeding  CT ABDOMEN AND PELVIS WITHOUT CONTRAST  Technique:  Multidetector CT imaging of the abdomen and pelvis was performed following the standard protocol without intravenous contrast.  Comparison: CT 11/06/2009  Findings: Lung bases are clear.  Unenhanced images of the liver gallbladder and bile ducts are normal.  Pancreas and spleen are normal.  No kidney stone is identified.  There is no renal mass or obstruction.  No retroperitoneal adenopathy.  Soft tissue mass fills the rectum.  This is concerning for rectal carcinoma.  It is possible this represents stool.  Endoscopy is suggested for further evaluation.  No bowel obstruction.  Appendix is normal.  There is moderate stool in the colon.  Degenerative changes are seen throughout the lumbar spine with multiple Schmorl's nodes.  IMPRESSION: Soft tissue mass fills the rectum, concerning for a rectal carcinoma.  Without intravenous contrast, this is difficult to differentiate from stool.  Endoscopy suggested for further evaluation.   Original Report Authenticated By: Janeece Riggers, M.D.       Assessment/Plan Active Problems:   GI bleed   HYPERTENSION   ESRD on hemodialysis   Mass in rectum  1-GI Bleed: Admit to step down unit. Patient with blood per rectum, rectal exam perform by ED physician. Will cycle hb, type and screen, blood transfusion as needed. CT scan show possible rectal mass. [facility] consulted. Initial hb at 10. INR at 1.09. NPO status.  2- ESRD: Patient due for dialysis. Patient with electrolytes abnormalities. Renal already consulted.  3-Hyperkalemia: EKG pending. Renal  consulted for dialysis.  4-Possible Rectal Mass: GI consulted,patient need colonoscopy and tissue biopsy.  5-Diabetes; SSI. Hold Lantus while npo.  6-History of Left tibia fracture. His next follow up with ortho is in May.    Code Status: Full Code.  Family Communication: Care discussed with patient.  Disposition Plan: Admit to step down, expect 4 to 5 days inpatient.   Time spent: 60 minutes.   Jaylinn Hellenbrand [facility] Pager [phone]  If 7PM-7AM, please contact night-coverage [email or web address] Password [facility] 10/27/2012, 3:27 PM

## 2012-10-27 NOTE — ED Provider Notes (Signed)
History     CSN: [id number]  Arrival date & time 10/27/12  1132   First MD Initiated Contact with Patient 10/27/12 1133      Chief Complaint  Patient presents with  . Rectal Bleeding    (Consider location/radiation/quality/duration/timing/severity/associated sxs/prior treatment) HPI Comments: 62 year old male with a past medical history of end-stage renal disease, CVA, diabetes, hepatitis C and hypertension among other medical problems presents to the emergency department with his wife complaining of rectal bleeding x1 day. Patient states he passed a large amount of dark red clots earlier today while he was making a bowel movement. Patient is a poor historian and will not answer many questions, so the rest of the history is obtained from the wife. Wife states patient has been constipated for the past 2 days, and 2 days ago she gave him a couple enemas without relief. This morning she was driving home to take patient to dialysis when she went to change his diaper and noticed a large amount of dark red clots in the diaper. Denies straining or rectal pain. Patient is only wearing a diaper because he underwent leg surgery on February 14 and is still in a cast. He is not incontinent. Still makes a small amount of urine. Patient was placed on Coumadin from February 14 until February 28. Currently he takes a daily 81 mg aspirin. Patient denies weakness lightheadedness, dizziness, chest pain, shortness of breath, confusion, abdominal pain, nausea, vomiting. Denies ever having rectal bleeding in the past. Patient is on dialysis Tuesday, Thursday and Saturday and has missed his dialysis today.  Patient is a 62 y.o. male presenting with hematochezia. The history is provided by the patient and the spouse.  Rectal Bleeding  Pertinent negatives include no fever, no abdominal pain, no nausea, no rectal pain, no vomiting and no chest pain.    Past Medical History  Diagnosis Date  . ALLERGIC RHINITIS 05/05/2007   . BACK PAIN, LUMBAR, CHRONIC 08/06/2009  . BENIGN PROSTATIC HYPERTROPHY 08/01/2010  . CEREBROVASCULAR ACCIDENT, HX OF 08/06/2009  . CHOLELITHIASIS 08/01/2010  . CONGESTIVE HEART FAILURE 03/18/2009  . DEPRESSION 03/18/2009  . DIABETES MELLITUS, TYPE II 03/25/2007  . ERECTILE DYSFUNCTION 03/25/2007  . GERD 03/25/2007  . HEPATITIS C, HX OF 03/25/2007  . HYPERTENSION 03/25/2007  . Morbid obesity 03/25/2007  . NEPHROLITHIASIS, HX OF 03/18/2009  . ESRD on hemodialysis 09/08/2012    ESRD due to DM/HTN. Started dialysis in November 2013. Has LUA AVF placed Jun 14 2012 which is maturing as of Feb 2014.     Past Surgical History  Procedure Laterality Date  . Nephrectomy      partial RR  . Av fistula placement  06/14/2012    Procedure: ARTERIOVENOUS (AV) FISTULA CREATION;  Surgeon: Chuck Hint, MD;  Location[facility] OR;  Service: Vascular;  Laterality: Left;  Left basilic vein transposition with fistula.  . Tibia im nail insertion Left 09/09/2012    Procedure: INTRAMEDULLARY (IM) NAIL TIBIAL;  Surgeon: Eulas Post, MD;  Location: [facility] OR;  Service: Orthopedics;  Laterality: Left;  left tibial nail and open reduction internal fixation left fibula fracture  . Orif fibula fracture Left 09/09/2012    Procedure: OPEN REDUCTION INTERNAL FIXATION (ORIF) FIBULA FRACTURE;  Surgeon: Eulas Post, MD;  Location: [facility] OR;  Service: Orthopedics;  Laterality: Left;    Family History  Problem Relation Age of Onset  . Coronary artery disease Other   . Diabetes Other     History  Substance Use  Topics  . Smoking status: Never Smoker   . Smokeless tobacco: Not on file  . Alcohol Use: No      Review of Systems  Constitutional: Negative for fever, chills and fatigue.  Respiratory: Negative for shortness of breath.   Cardiovascular: Negative for chest pain.  Gastrointestinal: Positive for constipation, blood in stool and hematochezia. Negative for nausea, vomiting, abdominal pain and rectal pain.   Musculoskeletal: Negative for back pain.  Neurological: Negative for dizziness, weakness and light-headedness.  Psychiatric/Behavioral: Negative for confusion.  All other systems reviewed and are negative.    Allergies  Review of patient's allergies indicates no known allergies.  Home Medications   Current Outpatient Rx  Name  Route  Sig  Dispense  Refill  . aspirin 81 MG EC tablet   Oral   Take 81 mg by mouth daily.           . Brinzolamide-Brimonidine (SIMBRINZA) 1-0.2 % SUSP   Right Eye   Place 1 drop into the right eye 2 (two) times daily.         . calcium acetate (PHOSLO) 667 MG capsule   Oral   Take 2 capsules (1,334 mg total) by mouth 3 (three) times daily with meals.   180 capsule   0   . insulin glargine (LANTUS SOLOSTAR) 100 UNIT/ML injection   Subcutaneous   Inject 15 Units into the skin daily.   5 pen   PRN   . labetalol (NORMODYNE) 300 MG tablet   Oral   Take 150 mg by mouth 2 (two) times daily.         . traMADol (ULTRAM) 50 MG tablet   Oral   Take 1 tablet (50 mg total) by mouth every 8 (eight) hours as needed.   90 tablet   0   . acetaminophen (TYLENOL) 325 MG tablet   Oral   Take 2 tablets (650 mg total) by mouth every 6 (six) hours as needed.         . pantoprazole (PROTONIX) 40 MG tablet   Oral   Take 1 tablet (40 mg total) by mouth daily.         . polyethylene glycol (MIRALAX / GLYCOLAX) packet   Oral   Take 17 g by mouth daily as needed.   14 each      . sennosides-docusate sodium (SENOKOT-S) 8.6-50 MG tablet   Oral   Take 1 tablet by mouth daily.   30 tablet   1   . Travoprost, BAK Free, (TRAVATAN) 0.004 % SOLN ophthalmic solution   Both Eyes   Place 1 drop into both eyes at bedtime.         [name] warfarin (COUMADIN) 1 MG tablet   Oral   Take 1 tablet (1 mg total) by mouth one time only at 6 PM.   15 tablet        Continue Coumadin 1 mg daily until 10/07/2012 and  ...     BP 119/71  Pulse 78  Temp(Src)  98.7 F (37.1 C) (Oral)  Resp 18  SpO2 97%  Physical Exam  Nursing note and vitals reviewed. Constitutional: He is oriented to person, place, and time. He appears well-developed and well-nourished. No distress.  HENT:  Head: Normocephalic and atraumatic.  Mouth/Throat: Oropharynx is clear and moist and mucous membranes are normal.  Eyes: Conjunctivae and EOM are normal. Pupils are equal, round, and reactive to light. No scleral icterus.  Neck: Normal range of motion. Neck  supple.  Cardiovascular: Normal rate, regular rhythm, normal heart sounds and intact distal pulses.   Pulmonary/Chest: Effort normal and breath sounds normal. No respiratory distress. He has no wheezes. He has no rales.  Abdominal: Soft. Bowel sounds are normal. He exhibits no distension and no mass. There is no tenderness. There is no rigidity, no rebound and no guarding.  Genitourinary:  Large amount of dark red blood clots from rectum and on exam bed. No tenderness on rectal exam. No masses palpated.  Musculoskeletal: Normal range of motion. He exhibits no edema.  Neurological: He is alert and oriented to person, place, and time.  Skin: Skin is warm and dry.  Psychiatric: His speech is normal. His affect is blunt.    ED Course  Procedures (including critical care time)  Labs Reviewed  CBC - Abnormal; Notable for the following:    RBC 3.69 (*)    Hemoglobin 10.0 (*)    HCT 30.2 (*)    RDW 18.1 (*)    Platelets 449 (*)    All other components within normal limits  COMPREHENSIVE METABOLIC PANEL - Abnormal; Notable for the following:    Potassium 5.9 (*)    Glucose, Bld 210 (*)    BUN 52 (*)    Creatinine, Ser 12.64 (*)    Albumin 2.7 (*)    Total Bilirubin 0.2 (*)    GFR calc non Af Amer 4 (*)    GFR calc Af Amer 4 (*)    All other components within normal limits  PROTIME-INR  TYPE AND SCREEN  ABO/RH   Ct Abdomen Pelvis Wo Contrast  10/27/2012  *RADIOLOGY REPORT*  Clinical Data: Rectal bleeding  CT  ABDOMEN AND PELVIS WITHOUT CONTRAST  Technique:  Multidetector CT imaging of the abdomen and pelvis was performed following the standard protocol without intravenous contrast.  Comparison: CT 11/06/2009  Findings: Lung bases are clear.  Unenhanced images of the liver gallbladder and bile ducts are normal.  Pancreas and spleen are normal.  No kidney stone is identified.  There is no renal mass or obstruction.  No retroperitoneal adenopathy.  Soft tissue mass fills the rectum.  This is concerning for rectal carcinoma.  It is possible this represents stool.  Endoscopy is suggested for further evaluation.  No bowel obstruction.  Appendix is normal.  There is moderate stool in the colon.  Degenerative changes are seen throughout the lumbar spine with multiple Schmorl's nodes.  IMPRESSION: Soft tissue mass fills the rectum, concerning for a rectal carcinoma.  Without intravenous contrast, this is difficult to differentiate from stool.  Endoscopy suggested for further evaluation.   Original Report Authenticated By: Janeece Riggers, M.D.      1. GI bleed   2. Renal failure       MDM  62 y/o male with GI bleed. He is actively bleeding in the ED. Hgb stable for him at 10. Hemodynamically stable. CT abdomen/pelvis showing soft tissue mass filling the rectum as shown above. Patient will be admitted. Admission accepted by Dr. Sunnie Nielsen, [facility] team 6. I consulted Dr. Darrick Penna with nephrology for patient's dialysis and spoke with [facility]. Both will see patient in hospital. Case discussed with Dr. Judd Lien who agrees with plan of care.        Trevor Mace, PA-C 10/27/12 1526

## 2012-10-27 NOTE — ED Notes (Signed)
Family at bedside.wife

## 2012-10-28 ENCOUNTER — Inpatient Hospital Stay (HOSPITAL_COMMUNITY): Payer: Medicare Other

## 2012-10-28 ENCOUNTER — Encounter (HOSPITAL_COMMUNITY): Payer: Self-pay | Admitting: Gastroenterology

## 2012-10-28 ENCOUNTER — Encounter (HOSPITAL_COMMUNITY)
Admission: EM | Disposition: A | Discharge status: Home / Self Care | Payer: Self-pay | Source: Home / Self Care | Attending: Internal Medicine

## 2012-10-28 DIAGNOSIS — D62 Acute posthemorrhagic anemia: Secondary | ICD-10-CM

## 2012-10-28 DIAGNOSIS — K922 Gastrointestinal hemorrhage, unspecified: Secondary | ICD-10-CM

## 2012-10-28 DIAGNOSIS — N186 End stage renal disease: Secondary | ICD-10-CM

## 2012-10-28 DIAGNOSIS — D638 Anemia in other chronic diseases classified elsewhere: Secondary | ICD-10-CM

## 2012-10-28 DIAGNOSIS — I5043 Acute on chronic combined systolic (congestive) and diastolic (congestive) heart failure: Secondary | ICD-10-CM | POA: Diagnosis present

## 2012-10-28 DIAGNOSIS — I5042 Chronic combined systolic (congestive) and diastolic (congestive) heart failure: Secondary | ICD-10-CM | POA: Insufficient documentation

## 2012-10-28 DIAGNOSIS — K5900 Constipation, unspecified: Secondary | ICD-10-CM | POA: Diagnosis present

## 2012-10-28 HISTORY — PX: COLONOSCOPY: SHX5424

## 2012-10-28 LAB — GLUCOSE, CAPILLARY
Glucose-Capillary: 115 mg/dL — ABNORMAL HIGH (ref 70–99)
Glucose-Capillary: 116 mg/dL — ABNORMAL HIGH (ref 70–99)

## 2012-10-28 LAB — MRSA PCR SCREENING: MRSA by PCR: NEGATIVE

## 2012-10-28 LAB — HEMOGLOBIN: Hemoglobin: 9.1 g/dL — ABNORMAL LOW (ref 13.0–17.0)

## 2012-10-28 SURGERY — COLONOSCOPY
Anesthesia: Moderate Sedation

## 2012-10-28 MED ORDER — BRINZOLAMIDE-BRIMONIDINE 1-0.2 % OP SUSP: 1.0000 [drp] | Freq: Two times a day (BID) | OPHTHALMIC | Status: DC

## 2012-10-28 MED ORDER — TRAMADOL HCL 50 MG PO TABS
50.0000 mg | ORAL_TABLET | Freq: Three times a day (TID) | ORAL | Status: DC | PRN
  Filled 2012-10-28: qty 1

## 2012-10-28 MED ORDER — BRIMONIDINE TARTRATE 0.2 % OP SOLN
1.0000 [drp] | Freq: Two times a day (BID) | OPHTHALMIC | Status: DC
  Administered 2012-10-28 – 2012-10-30 (×3): 1 [drp] via OPHTHALMIC
  Filled 2012-10-28: qty 5

## 2012-10-28 MED ORDER — MORPHINE SULFATE 2 MG/ML IJ SOLN: 1.0000 mg | INTRAMUSCULAR | Status: DC | PRN

## 2012-10-28 MED ORDER — TRAVOPROST (BAK FREE) 0.004 % OP SOLN
1.0000 [drp] | Freq: Every day | OPHTHALMIC | Status: DC
  Administered 2012-10-28 – 2012-10-29 (×2): 1 [drp] via OPHTHALMIC
  Filled 2012-10-28: qty 2.5

## 2012-10-28 MED ORDER — CALCIUM ACETATE 667 MG PO CAPS
1334.0000 mg | ORAL_CAPSULE | Freq: Three times a day (TID) | ORAL | Status: DC
  Administered 2012-10-29 – 2012-10-30 (×4): 1334 mg via ORAL
  Filled 2012-10-28 (×8): qty 2

## 2012-10-28 MED ORDER — HYDROCODONE-ACETAMINOPHEN 5-325 MG PO TABS: 1.0000 | ORAL_TABLET | ORAL | Status: DC | PRN

## 2012-10-28 MED ORDER — MIDAZOLAM HCL 5 MG/ML IJ SOLN
INTRAMUSCULAR | Status: AC
  Filled 2012-10-28: qty 2

## 2012-10-28 MED ORDER — SODIUM CHLORIDE 0.9 % IJ SOLN: 3.0000 mL | Freq: Two times a day (BID) | INTRAMUSCULAR | Status: DC

## 2012-10-28 MED ORDER — DARBEPOETIN ALFA-POLYSORBATE 100 MCG/0.5ML IJ SOLN
100.0000 ug | INTRAMUSCULAR | Status: DC
  Filled 2012-10-28: qty 0.5

## 2012-10-28 MED ORDER — SODIUM CHLORIDE 0.9 % IV SOLN: 250.0000 mL | INTRAVENOUS | Status: DC | PRN

## 2012-10-28 MED ORDER — ACETAMINOPHEN 650 MG RE SUPP: 650.0000 mg | Freq: Four times a day (QID) | RECTAL | Status: DC | PRN

## 2012-10-28 MED ORDER — MIDAZOLAM HCL 5 MG/5ML IJ SOLN
INTRAMUSCULAR | Status: DC | PRN
  Administered 2012-10-28 (×2): 2 mg via INTRAVENOUS
  Administered 2012-10-28: 1 mg via INTRAVENOUS

## 2012-10-28 MED ORDER — FENTANYL CITRATE 0.05 MG/ML IJ SOLN
INTRAMUSCULAR | Status: AC
  Filled 2012-10-28: qty 2

## 2012-10-28 MED ORDER — SODIUM CHLORIDE 0.9 % IJ SOLN: 3.0000 mL | INTRAMUSCULAR | Status: DC | PRN

## 2012-10-28 MED ORDER — PANTOPRAZOLE SODIUM 40 MG PO TBEC
40.0000 mg | DELAYED_RELEASE_TABLET | Freq: Every day | ORAL | Status: DC
  Administered 2012-10-29 – 2012-10-30 (×2): 40 mg via ORAL
  Filled 2012-10-28: qty 1

## 2012-10-28 MED ORDER — FENTANYL CITRATE 0.05 MG/ML IJ SOLN
INTRAMUSCULAR | Status: DC | PRN
  Administered 2012-10-28 (×3): 25 ug via INTRAVENOUS

## 2012-10-28 MED ORDER — ACETAMINOPHEN 325 MG PO TABS: 650.0000 mg | ORAL_TABLET | Freq: Four times a day (QID) | ORAL | Status: DC | PRN

## 2012-10-28 MED ORDER — BRINZOLAMIDE 1 % OP SUSP
1.0000 [drp] | Freq: Two times a day (BID) | OPHTHALMIC | Status: DC
  Administered 2012-10-28 – 2012-10-30 (×3): 1 [drp] via OPHTHALMIC
  Filled 2012-10-28: qty 10

## 2012-10-28 MED ORDER — MORPHINE SULFATE 4 MG/ML IJ SOLN: 1.0000 mg | INTRAMUSCULAR | Status: DC | PRN

## 2012-10-28 NOTE — Progress Notes (Signed)
S: Just had A LARGE BM that would qualify for dis impaction O:BP 140/81  Pulse 83  Temp(Src) 97.6 F (36.4 C) (Oral)  Resp 14  Ht 5\' 9"  (1.753 m)  Wt 97.5 kg (214 lb 15.2 oz)  BMI 31.73 kg/m2  SpO2 100%  Intake/Output Summary (Last 24 hours) at 10/28/12 0923 Last data filed at 10/28/12 1610  Gross per 24 hour  Intake   1000 ml  Output   1601 ml  Net   -601 ml   Weight change:  RUE:AVWUJ and alert CVS:RRR Resp:clear Abd:+ BS NTND Ext:no edema on Rt. Lt leg in cast.  + bruit Lt ua AVF NEURO:CNI OX# no asterixis   . [START ON 11/03/2012] darbepoetin (ARANESP) injection - DIALYSIS  100 mcg Intravenous Q Thu-HD  . doxercalciferol  6 mcg Intravenous Q T,Th,Sa-HD  . ferric gluconate (FERRLECIT/NULECIT) IV  125 mg Intravenous Q T,Th,Sa-HD  . insulin aspart  0-9 Units Subcutaneous TID WC  . multivitamin  1 tablet Oral QHS  . pantoprazole (PROTONIX) IV  40 mg Intravenous Q12H   Ct Abdomen Pelvis Wo Contrast  10/27/2012  *RADIOLOGY REPORT*  Clinical Data: Rectal bleeding  CT ABDOMEN AND PELVIS WITHOUT CONTRAST  Technique:  Multidetector CT imaging of the abdomen and pelvis was performed following the standard protocol without intravenous contrast.  Comparison: CT 11/06/2009  Findings: Lung bases are clear.  Unenhanced images of the liver gallbladder and bile ducts are normal.  Pancreas and spleen are normal.  No kidney stone is identified.  There is no renal mass or obstruction.  No retroperitoneal adenopathy.  Soft tissue mass fills the rectum.  This is concerning for rectal carcinoma.  It is possible this represents stool.  Endoscopy is suggested for further evaluation.  No bowel obstruction.  Appendix is normal.  There is moderate stool in the colon.  Degenerative changes are seen throughout the lumbar spine with multiple Schmorl's nodes.  IMPRESSION: Soft tissue mass fills the rectum, concerning for a rectal carcinoma.  Without intravenous contrast, this is difficult to differentiate from  stool.  Endoscopy suggested for further evaluation.   Original Report Authenticated By: Janeece Riggers, M.D.    Dg Chest Port 1 View  10/28/2012  *RADIOLOGY REPORT*  Clinical Data: Confusion, shortness of breath.  PORTABLE CHEST - 1 VIEW  Comparison: 09/07/2012.  Findings: Trachea is midline.  Heart size stable.  Right IJ dialysis catheter tips project over the SVC.  Lungs are clear.  No pleural fluid.  IMPRESSION: No acute findings.   Original Report Authenticated By: Leanna Battles, M.D.    BMET    Component Value Date/Time   NA 134* 10/27/2012 1900   K 5.9* 10/27/2012 1900   CL 98 10/27/2012 1900   CO2 21 10/27/2012 1900   GLUCOSE 173* 10/27/2012 1900   BUN 57* 10/27/2012 1900   CREATININE 12.68* 10/27/2012 1900   CALCIUM 8.9 10/27/2012 1900   GFRNONAA 4* 10/27/2012 1900   GFRAA 4* 10/27/2012 1900   CBC    Component Value Date/Time   WBC 7.2 10/27/2012 1236   RBC 3.69* 10/27/2012 1236   HGB 8.7* 10/28/2012 0702   HCT 30.2* 10/27/2012 1236   PLT 449* 10/27/2012 1236   MCV 81.8 10/27/2012 1236   MCH 27.1 10/27/2012 1236   MCHC 33.1 10/27/2012 1236   RDW 18.1* 10/27/2012 1236   LYMPHSABS 1.9 09/09/2012 0710   MONOABS 0.6 09/09/2012 0710   EOSABS 0.1 09/09/2012 0710   BASOSABS 0.0 09/09/2012 0710  Assessment: 1. Rectal bleeding.  For colonoscopy today 2. HTN 3. DM 4. Anemia on aranesp 5. Sec HPTH on hectorol 6. ESRD  Plan: 1. Will plan HD in AM 2.  I discussed with him the need to start using AVF.  Apparently he has been refusing at the outpt unit 3.  Recheck cbc at HD tomorrow.   Kavir Savoca T

## 2012-10-28 NOTE — Progress Notes (Signed)
Pt's wife called and notified, she came to unit to see Pt and she was abble to have him drink the entire gallon of bowel prep.

## 2012-10-28 NOTE — Progress Notes (Signed)
[facility] Progress Note [place] TEAM 1 - Stepdown/ICU TEAM   Oscar Castillo:[id number] DOB: February 15, 1951 DOA: 10/27/2012 PCP: Oliver Barre, MD  Brief narrative: 62 year old male patient with chronic kidney disease on dialysis, diabetes, chronic combined heart failure and hepatitis C. He presented to the emergency department complaining of noticing red blood in his stool. Patient described as a significant amount about 1 cup. No pain or prior episodes of hematochezia. No other associated symptoms. No family history of colon cancer no prior colonoscopy or endoscopy. CT of the abdomen and pelvis performed in the emergency department revealed a soft tissue mass in the rectum which was concerning for rectal carcinoma.  Patient was not given any oral contrast so it was difficult to differentiate a potential mass from stool. Patient's hemoglobin at presentation was 10 which was at his baseline.  Gastroenterology and nephrology services were consulted.  Assessment/Plan:  Acute blood loss anemia on Anemia due to chronic illness -Hgb drifted down to 8.7 since admit - may be partially dilutional since pt also needs HD -cycle CBC q 12 hrs -transfuse only if hgb < 7  GI bleed  -appreciate GI assistance - colo reveals only hemorrhoids  ? Mass in rectum Obstipation -pt had MASSIVE stool after bowel prep - this was the "mass" seen on CT as no mass noted on colonoscopy   HYPERTENSION w/ severe concentric LVH -controlled / well compensated  DM with renal manifestation -currently controlled with SSI -resume home Lantus as diet advanced  CKD (chronic kidney disease) stage V requiring chronic dialysis -Nephro following -for HD today  Chronic combined systolic (EF 45%) and grade 2 diastolic congestive heart failure -compensated - primary mode of treatment is HD and BP management  HEPATITIS C, HX OF  CEREBROVASCULAR ACCIDENT, HX OF  BENIGN PROSTATIC HYPERTROPHY  DVT prophylaxis:   SCDs Code Status: Full Family Communication: Patient Disposition Plan: transfer to med/surg bed Isolation: None  Consultants: Gastroenterology Nephrology  Procedures: Colonoscopy 4/4 - hemorrhoids - no mass evident   Antibiotics: None  HPI/Subjective: Patient alert and without any complaints. Documented overnight patient was confused but this seems to have resolved. No apparent further bleeding even with large bowel movement this morning.  Objective: Blood pressure 146/81, pulse 87, temperature 99.1 F (37.3 C), temperature source Oral, resp. rate 15, height 5\' 9"  (1.753 m), weight 97.5 kg (214 lb 15.2 oz), SpO2 100.00%.  Intake/Output Summary (Last 24 hours) at 10/28/12 1235 Last data filed at 10/28/12 1107  Gross per 24 hour  Intake   1000 ml  Output   1602 ml  Net   -602 ml    Exam: General: No acute respiratory distress Lungs: Clear to auscultation bilaterally without wheezes or crackles, RA Cardiovascular: Regular rate and rhythm without murmur gallop or rub normal S1 and S2, no peripheral edema or JVD Abdomen: Nontender, nondistended, soft, bowel sounds positive, no rebound, no ascites, no appreciable mass Musculoskeletal: No significant cyanosis, clubbing of bilateral lower extremities - has below the knee LLE cast Neurological: Alert and oriented x 3, moves all extremities x 4 without focal neurological deficits, CN 2-13 intact  Data Reviewed: Basic Metabolic Panel:  Recent Labs Lab 10/27/12 1236 10/27/12 1900  NA 136 134*  K 5.9* 5.9*  CL 98 98  CO2 25 21  GLUCOSE 210* 173*  BUN 52* 57*  CREATININE 12.64* 12.68*  CALCIUM 9.3 8.9  PHOS  --  6.1*   Liver Function Tests:  Recent Labs Lab 10/27/12 1236 10/27/12 1900  AST 14  --   ALT 9  --   ALKPHOS 72  --   BILITOT 0.2*  --   PROT 7.4  --   ALBUMIN 2.7* 2.6*    Recent Labs Lab 10/28/12 0702  AMMONIA 17   CBC:  Recent Labs Lab 10/27/12 1236 10/27/12 1533 10/27/12 2325  10/28/12 0702  WBC 7.2  --   --   --   HGB 10.0* 10.0* 9.1* 8.7*  HCT 30.2*  --   --   --   MCV 81.8  --   --   --   PLT 449*  --   --   --    BNP (last 3 results)  Recent Labs  06/11/12 0916  PROBNP 6937.0*   CBG:  Recent Labs Lab 10/27/12 2132 10/28/12 0142  GLUCAP 196* 159*    Recent Results (from the past 240 hour(s))  MRSA PCR SCREENING     Status: None   Collection Time    10/28/12  1:29 AM      Result Value Range Status   MRSA by PCR NEGATIVE  NEGATIVE Final   Comment:            The GeneXpert MRSA Assay (FDA     approved for NASAL specimens     only), is one component of a     comprehensive MRSA colonization     surveillance program. It is not     intended to diagnose MRSA     infection nor to guide or     monitor treatment for     MRSA infections.     Studies:  Recent x-ray studies have been reviewed in detail by the Attending Physician  Scheduled Meds:  Reviewed in detail by the Attending Physician   Junious Silk, ANP [facility] Office  [phone] Pager [phone]  On-Call/Text Page:      [email or web address]      password [email or web address]  If 7PM-7AM, please contact night-coverage [email or web address] Password [email or web address] 10/28/2012, 12:35 PM   LOS: 1 day   I have personally examined this patient and reviewed the entire database. I have reviewed the above note, made any necessary editorial changes, and agree with its content.  Lonia Blood, MD [facility]

## 2012-10-28 NOTE — Progress Notes (Signed)
Nutrition Brief Note   Patient identified on the Malnutrition Screening Tool (MST) Report for recent weight loss without trying (patient unsure).  Per records below, patient's weight has fluctuated likely due to fluid given medical hx.  Wt Readings from Last 10 Encounters:  10/28/12 214 lb 15.2 oz (97.5 kg)  10/03/12 187 lb (84.823 kg)  09/23/12 207 lb 10.8 oz (94.2 kg)  09/12/12 223 lb 5.2 oz (101.3 kg)  09/12/12 223 lb 5.2 oz (101.3 kg)  06/21/12 219 lb 12.8 oz (99.7 kg)  06/21/12 219 lb 12.8 oz (99.7 kg)  11/04/11 251 lb 2 oz (113.91 kg)  08/07/11 266 lb (120.657 kg)  05/06/11 265 lb (120.203 kg)    Body mass index is 31.73 kg/(m^2). Patient meets criteria for Obesity Class I based on current BMI.   Current diet order is Clear Liquids, for colonoscopy.  Labs and medications reviewed.   No nutrition interventions warranted at this time.  If nutrition issues arise, please consult RD.   Maureen Chatters, RD, LDN Pager #: [phone] After-Hours Pager #: [phone]

## 2012-10-28 NOTE — ED Provider Notes (Signed)
Medical screening examination/treatment/procedure(s) were performed by non-physician practitioner and as supervising physician I was immediately available for consultation/collaboration.  Elion Hocker, MD 10/28/12 0700 

## 2012-10-28 NOTE — Progress Notes (Signed)
Pt has been encouraged several times to drink his bowel prep for his colonoscopy later today. He has repeatedly reassured this nurse he will drink it then sits his cup down in his lap. He has become agitated each time this nurse tried to encourage him. On call MD paged and informed.

## 2012-10-28 NOTE — Op Note (Addendum)
[name] [address] [place], [address]   COLONOSCOPY PROCEDURE REPORT  PATIENT: Oscar, Castillo  MR#: [id number] BIRTHDATE: 1951/01/25 , 62  yrs. old GENDER: Male ENDOSCOPIST: Vida Rigger, MD REFERRED [id number] Briant Cedar, M.D. PROCEDURE DATE:  10/28/2012 PROCEDURE:   Colonoscopy, diagnostic ASA CLASS:   Class III INDICATIONS:Rectal Bleeding and Anemia, non-specific. MEDICATIONS: Fentanyl 75 mcg IV and Versed 5 mg IV  DESCRIPTION OF PROCEDURE:   After the risks benefits and alternatives of the procedure were thoroughly explained, informed consent was obtained.  The [employer]ax Adult Colon [id number]  endoscope was introduced through the anus and advanced to the cecum, which was identified by both the appendix and ileocecal valve , limited by No adverse events experienced. to advanced to the cecum did not require any position changes or any abdominal pressure The quality of the prep was adequate. .  The instrument was then slowly withdrawn as the colon was fully examined.no blood was seen on insertion nor any significant abnormality and on slow withdrawal the entire colon was normal and once back in the rectum anal rectal pull-through and retroflexion only revealed internal and external this maneuver was also done on insertion initially. The scope was straightened rere advance a short ways of the left side air was suctionedand the scope was removed and the patient tolerated the procedure well there was no obvious immediate complication     FINDINGS:  1internal/external hemorrhoids only most likely source of bleeding 2. Otherwise within normal limits to the cecum without any blood being seen COMPLICATIONS: none  IMPRESSION:  above  RECOMMENDATIONS: Will advance diet happy to see back when necessary please call us if any further question or problem   _______________________________ eSigned:  Vida Rigger, MD 10/28/2012 2:47 PM   VH:[id number]  Briant Cedar, MD

## 2012-10-28 NOTE — Progress Notes (Signed)
Pt becoming increasingly agitated now yelling "Ester" repeatedly and refusing to remain calm and follow directions. He is asking to have leads, pulse ox, gown and all other attachments be removed so he can go home. He has been reoriented several time and that seems be make him even more agitated and aggressive.

## 2012-10-29 LAB — CBC
MCV: 80.8 fL (ref 78.0–100.0)
Platelets: 305 10*3/uL (ref 150–400)
RBC: 2.61 MIL/uL — ABNORMAL LOW (ref 4.22–5.81)
RDW: 18 % — ABNORMAL HIGH (ref 11.5–15.5)
WBC: 6.6 10*3/uL (ref 4.0–10.5)

## 2012-10-29 MED ORDER — ALTEPLASE 2 MG IJ SOLR: 2.0000 mg | Freq: Once | INTRAMUSCULAR | Status: DC | PRN

## 2012-10-29 MED ORDER — INSULIN GLARGINE 100 UNIT/ML ~~LOC~~ SOLN: 15.0000 [IU] | Freq: Every day | SUBCUTANEOUS | Status: DC

## 2012-10-29 MED ORDER — LIDOCAINE HCL (PF) 1 % IJ SOLN: 5.0000 mL | INTRAMUSCULAR | Status: DC | PRN

## 2012-10-29 MED ORDER — DARBEPOETIN ALFA-POLYSORBATE 100 MCG/0.5ML IJ SOLN
INTRAMUSCULAR | Status: AC
  Administered 2012-10-29: 100 ug via INTRAVENOUS
  Filled 2012-10-29: qty 0.5

## 2012-10-29 MED ORDER — SODIUM CHLORIDE 0.9 % IV SOLN: 100.0000 mL | INTRAVENOUS | Status: DC | PRN

## 2012-10-29 MED ORDER — INSULIN GLARGINE 100 UNIT/ML ~~LOC~~ SOLN
10.0000 [IU] | Freq: Every day | SUBCUTANEOUS | Status: DC
  Administered 2012-10-29: 10 [IU] via SUBCUTANEOUS
  Filled 2012-10-29 (×2): qty 0.1

## 2012-10-29 MED ORDER — HEPARIN SODIUM (PORCINE) 1000 UNIT/ML DIALYSIS: 1000.0000 [IU] | INTRAMUSCULAR | Status: DC | PRN

## 2012-10-29 MED ORDER — NEPRO/CARBSTEADY PO LIQD: 237.0000 mL | ORAL | Status: DC | PRN

## 2012-10-29 MED ORDER — HEPARIN SODIUM (PORCINE) 1000 UNIT/ML DIALYSIS: 20.0000 [IU]/kg | INTRAMUSCULAR | Status: DC | PRN

## 2012-10-29 MED ORDER — LIDOCAINE-PRILOCAINE 2.5-2.5 % EX CREA: 1.0000 "application " | TOPICAL_CREAM | CUTANEOUS | Status: DC | PRN

## 2012-10-29 MED ORDER — PENTAFLUOROPROP-TETRAFLUOROETH EX AERO: 1.0000 "application " | INHALATION_SPRAY | CUTANEOUS | Status: DC | PRN

## 2012-10-29 MED ORDER — DOXERCALCIFEROL 4 MCG/2ML IV SOLN
INTRAVENOUS | Status: AC
  Administered 2012-10-29: 6 ug via INTRAVENOUS
  Filled 2012-10-29: qty 4

## 2012-10-29 NOTE — Procedures (Addendum)
Pt seen on HD.  Ap 90 Vp 180.  Using AVF with 2 17g needles.  CBC pending.  If HG ok he should be able to go home.  Hg came back at 6.9.  Will give 2 units Prbc's

## 2012-10-29 NOTE — Progress Notes (Signed)
Dr. Sunnie Nielsen notified ; no new orders but to monitor pt

## 2012-10-29 NOTE — Evaluation (Signed)
Physical Therapy Evaluation Patient Details Name: Oscar DIIORIO MRN: [id number] DOB: 1951-01-08 Today's Date: 10/29/2012 Time: 6295-2841 PT Time Calculation (min): 25 min  PT Assessment / Plan / Recommendation Clinical Impression  Pt s/p GIB with decr mobility secondary to decr endurance for activity and confusion.  Will benefit from PT to address endurance and balance issues.  Pt will need 24 hour assist and HHPT f/u on d/c and if pt does not have 24 hour care will need NHP.      PT Assessment  Patient needs continued PT services    Follow Up Recommendations  Home health PT;SNF;Supervision/Assistance - 24 hour                Equipment Recommendations  None recommended by PT         Frequency Min 3X/week    Precautions / Restrictions Precautions Precautions: Fall Required Braces or Orthoses: Other Brace/Splint Other Brace/Splint: cast LLE Restrictions Weight Bearing Restrictions: Yes LLE Weight Bearing: Non weight bearing Other Position/Activity Restrictions: assume NWB LLE secondary to L tib/fib fx 2/14   Pertinent Vitals/Pain VSS, No pain      Mobility  Bed Mobility Bed Mobility: Rolling Right;Right Sidelying to Sit;Sitting - Scoot to Edge of Bed Rolling Right: 4: Min guard;With rail Right Sidelying to Sit: 4: Min guard;With rails Sitting - Scoot to Edge of Bed: 4: Min guard Details for Bed Mobility Assistance: Took incr time to get to EOB and needed cues for technique.  Pt slow to process.   Transfers Transfers: Sit to Stand;Stand to Sit;Stand Pivot Transfers Sit to Stand: With upper extremity assist;With armrests;From bed;From chair/3-in-1;3: Mod assist Stand to Sit: 4: Min assist;With upper extremity assist;With armrests;To chair/3-in-1 Stand Pivot Transfers: 3: Mod assist Details for Transfer Assistance: Pt needed cues for hand placement.  Pt has difficulty recalling information and needs repetition to remember to perform NWB LLE.    Ambulation/Gait Ambulation/Gait Assistance: 4: Min assist Ambulation Distance (Feet): 5 Feet Assistive device: Rolling walker Ambulation/Gait Assistance Details: Pt needed constant cues and assist to perform NWB LLE as pt did not seem to be able to recall precautions from one minute to the next.  Pt intermittently confused throughout session.   Gait Pattern: Step-to pattern;Trunk flexed;Decreased stride length Gait velocity: decreased Stairs: No Wheelchair Mobility Wheelchair Mobility: No         PT Diagnosis: Generalized weakness  PT Problem List: Decreased activity tolerance;Decreased balance;Decreased mobility;Decreased safety awareness;Decreased knowledge of use of DME;Decreased knowledge of precautions PT Treatment Interventions: DME instruction;Gait training;Functional mobility training;Therapeutic activities;Therapeutic exercise;Balance training;Patient/family education   PT Goals Acute Rehab PT Goals PT Goal Formulation: With patient Time For Goal Achievement: 11/12/12 Potential to Achieve Goals: Good Pt will go Supine/Side to Sit: Independently PT Goal: Supine/Side to Sit - Progress: Goal set today Pt will go Sit to Stand: Independently PT Goal: Sit to Stand - Progress: Goal set today Pt will Ambulate: 51 - 150 feet;with supervision;with least restrictive assistive device PT Goal: Ambulate - Progress: Goal set today Pt will Go Up / Down Stairs: 3-5 stairs;with supervision;with least restrictive assistive device PT Goal: Up/Down Stairs - Progress: Goal set today  Visit Information  Last PT Received On: 10/29/12 Assistance Needed: +2    Subjective Data  Subjective: "I need to find my wife."  Pt repeated this at least 10 x during treatment Patient Stated Goal: To go home   Prior Functioning  Home Living Lives With: Spouse Available Help at Discharge: Family Type of Home: House  Home Access: Stairs to enter Entrance Stairs-Number of Steps: 1 Entrance Stairs-Rails:  None Home Layout: One level Bathroom Shower/Tub: Other (comment) (sponge bathes) Bathroom Toilet: Standard Home Adaptive Equipment: Wheelchair - manual;Walker - standard;Bedside commode/3-in-1 Prior Function Level of Independence: Independent with assistive device(s) Able to Take Stairs?: Yes Driving: No Vocation: Retired Musician: No difficulties Dominant Hand: Right    Cognition  Cognition Overall Cognitive Status: Impaired Area of Impairment: Memory;Attention;Following commands;Safety/judgement;Awareness of deficits;Problem solving Arousal/Alertness: Awake/alert Orientation Level: Place;Disoriented to Behavior During Session: [facility] for tasks performed Current Attention Level: Focused Memory: Decreased recall of precautions Following Commands: Follows one step commands inconsistently;Follows one step commands with increased time Safety/Judgement: Decreased awareness of safety precautions;Decreased safety judgement for tasks assessed;Decreased awareness of need for assistance Safety/Judgement - Other Comments: Placed chair alarm under pt for safety    Extremity/Trunk Assessment Right Lower Extremity Assessment RLE ROM/Strength/Tone: [facility] for tasks assessed Left Lower Extremity Assessment LLE ROM/Strength/Tone: Deficits;Unable to fully assess LLE ROM/Strength/Tone Deficits: due to cast; knee and hip appear [facility] Trunk Assessment Trunk Assessment: Kyphotic   Balance Static Standing Balance Static Standing - Balance Support: Bilateral upper extremity supported;During functional activity Static Standing - Level of Assistance: 4: Min assist Static Standing - Comment/# of Minutes: 2 - needs steadying asssit secondary to NWBLLE and pt impulsive and forgets precautions.  End of Session PT - End of Session Equipment Utilized During Treatment: Gait belt Activity Tolerance: Patient limited by fatigue Patient left: in chair;with call bell/phone within reach;with chair  alarm set Nurse Communication: Mobility status       INGOLD,Terrianna Holsclaw 10/29/2012, 4:22 PM [facility] Acute Rehabilitation [phone] (pager)

## 2012-10-29 NOTE — Progress Notes (Signed)
Critical hgb of 6.9 reported from lab this a.m Dr Briant Cedar notified, orders given to transfuse 2 unit of PRBCs while on dialysis today.

## 2012-10-29 NOTE — Progress Notes (Signed)
[facility] PROGRESS NOTE  Oscar Castillo ZOX:[id number] DOB: [birth year] DOA: 10/27/2012 PCP: Oliver Barre, MD  Assessment/Plan: Acute blood loss anemia on Anemia due to chronic illness  -secondary to hemorrhoid bleed.  -Hb decrease to 6.9. Patient received 2 units PRBC today during dialysis. Repeat hb in am. Monitor for bleeding.  GI bleed  -Secondary to hemorrhoids  ? Mass in rectum:  -Mass rectum was probably stool.  -no mass noted on colonoscopy  HYPERTENSION w/ severe concentric LVH  -controlled / well compensated  DM with renal manifestation  -currently controlled with SSI  -resume home Lantus.  CKD (chronic kidney disease) stage V requiring chronic dialysis  -Nephro following  -for HD today  Chronic combined systolic (EF 45%) and grade 2 diastolic congestive heart failure  -compensated -  HEPATITIS C, HX OF  CEREBROVASCULAR ACCIDENT, HX OF  BENIGN PROSTATIC HYPERTROPHY   Code Status: Full  Family Communication: care discussed with patient.  Disposition Plan: home in 24 to 48 hours.    Consultants:  Dr Ewing Schlein.  Procedures: Colonoscopy 4/4 - hemorrhoids - no mass evident    Antibiotics:  none  HPI/Subjective: Feeling ok, he is tired just came from dialysis. He wants to sleep. Want to go home. No blood in the stool.   Objective: Filed Vitals:   10/29/12 1047 10/29/12 1113 10/29/12 1304 10/29/12 1456  BP: 143/83 175/84 130/103 170/83  Pulse: 83 84 82 85  Temp: 98.4 F (36.9 C) 98.4 F (36.9 C) 98.2 F (36.8 C)   TempSrc: Oral Oral Oral   Resp: 18 18 18    Height:      Weight:  92.7 kg (204 lb 5.9 oz)    SpO2:  100% 100%     Intake/Output Summary (Last 24 hours) at 10/29/12 1606 Last data filed at 10/29/12 1300  Gross per 24 hour  Intake   1260 ml  Output   1770 ml  Net   -510 ml   Filed Weights   10/28/12 2203 10/29/12 0712 10/29/12 1113  Weight: 94.4 kg (208 lb 1.8 oz) 94.4 kg (208 lb 1.8 oz) 92.7 kg (204 lb 5.9 oz)     Exam:   General:  No distress.  Cardiovascular: S 1, S 2 RRR  Respiratory: CTA  Abdomen: bs present, soft, nt  Musculoskeletal: no edema.   Data Reviewed: Basic Metabolic Panel:  Recent Labs Lab 10/27/12 1236 10/27/12 1900  NA 136 134*  K 5.9* 5.9*  CL 98 98  CO2 25 21  GLUCOSE 210* 173*  BUN 52* 57*  CREATININE 12.64* 12.68*  CALCIUM 9.3 8.9  PHOS  --  6.1*   Liver Function Tests:  Recent Labs Lab 10/27/12 1236 10/27/12 1900  AST 14  --   ALT 9  --   ALKPHOS 72  --   BILITOT 0.2*  --   PROT 7.4  --   ALBUMIN 2.7* 2.6*   No results found for this basename: LIPASE, AMYLASE,  in the last 168 hours  Recent Labs Lab 10/28/12 0702  AMMONIA 17   CBC:  Recent Labs Lab 10/27/12 1236 10/27/12 1533 10/27/12 2325 10/28/12 0702 10/29/12 0750  WBC 7.2  --   --   --  6.6  HGB 10.0* 10.0* 9.1* 8.7* 6.9*  HCT 30.2*  --   --   --  21.1*  MCV 81.8  --   --   --  80.8  PLT 449*  --   --   --  305  Cardiac Enzymes: No results found for this basename: CKTOTAL, CKMB, CKMBINDEX, TROPONINI,  in the last 168 hours BNP (last 3 results)  Recent Labs  06/11/12 0916  PROBNP 6937.0*   CBG:  Recent Labs Lab 10/28/12 0932 10/28/12 1323 10/28/12 1625 10/28/12 2205 10/29/12 1255  GLUCAP 115* 115* 116* 156* 121*    Recent Results (from the past 240 hour(s))  MRSA PCR SCREENING     Status: None   Collection Time    10/28/12  1:29 AM      Result Value Range Status   MRSA by PCR NEGATIVE  NEGATIVE Final   Comment:            The GeneXpert MRSA Assay (FDA     approved for NASAL specimens     only), is one component of a     comprehensive MRSA colonization     surveillance program. It is not     intended to diagnose MRSA     infection nor to guide or     monitor treatment for     MRSA infections.     Studies: Dg Chest Port 1 View  10/28/2012  *RADIOLOGY REPORT*  Clinical Data: Confusion, shortness of breath.  PORTABLE CHEST - 1 VIEW  Comparison:  09/07/2012.  Findings: Trachea is midline.  Heart size stable.  Right IJ dialysis catheter tips project over the SVC.  Lungs are clear.  No pleural fluid.  IMPRESSION: No acute findings.   Original Report Authenticated By: Leanna Battles, M.D.     Scheduled Meds: . brinzolamide  1 drop Right Eye BID   And  . brimonidine  1 drop Right Eye BID  . calcium acetate  1,334 mg Oral TID WC  . darbepoetin (ARANESP) injection - DIALYSIS  100 mcg Intravenous Q Sat-HD  . doxercalciferol  6 mcg Intravenous Q T,Th,Sa-HD  . ferric gluconate (FERRLECIT/NULECIT) IV  125 mg Intravenous Q T,Th,Sa-HD  . insulin aspart  0-9 Units Subcutaneous TID WC  . multivitamin  1 tablet Oral QHS  . pantoprazole  40 mg Oral Q1200  . Travoprost (BAK Free)  1 drop Both Eyes QHS   Continuous Infusions:   Active Problems:   GI bleed   HYPERTENSION   HEPATITIS C, HX OF   CEREBROVASCULAR ACCIDENT, HX OF   BENIGN PROSTATIC HYPERTROPHY   Anemia due to chronic illness   LVH (left ventricular hypertrophy)-severe concentric   DM (diabetes mellitus) type I controlled with renal manifestation   CKD (chronic kidney disease) stage V requiring chronic dialysis   Mass in rectum   Acute blood loss anemia   Chronic combined systolic (EF 45%) and grade 2diastolic congestive heart failure   Obstipation    Time spent: 25 minutes.     [name]  [facility] Pager [phone]. If 7PM-7AM, please contact night-coverage at [email or web address], password [facility] 10/29/2012, 4:06 PM  LOS: 2 days

## 2012-10-30 LAB — TYPE AND SCREEN
ABO/RH(D): O POS
Unit division: 0

## 2012-10-30 LAB — CBC
HCT: 26.2 % — ABNORMAL LOW (ref 39.0–52.0)
Hemoglobin: 8.8 g/dL — ABNORMAL LOW (ref 13.0–17.0)
MCHC: 33.6 g/dL (ref 30.0–36.0)
MCV: 82.9 fL (ref 78.0–100.0)
RDW: 17 % — ABNORMAL HIGH (ref 11.5–15.5)

## 2012-10-30 LAB — GLUCOSE, CAPILLARY: Glucose-Capillary: 140 mg/dL — ABNORMAL HIGH (ref 70–99)

## 2012-10-30 MED ORDER — POLYETHYLENE GLYCOL 3350 17 G PO PACK: 17.0000 g | PACK | Freq: Every day | ORAL | Status: DC

## 2012-10-30 MED ORDER — LABETALOL HCL 300 MG PO TABS
300.0000 mg | ORAL_TABLET | Freq: Two times a day (BID) | ORAL | Status: DC
  Administered 2012-10-30: 300 mg via ORAL
  Filled 2012-10-30 (×2): qty 1

## 2012-10-30 MED ORDER — TRAVOPROST (BAK FREE) 0.004 % OP SOLN: 1.0000 [drp] | Freq: Every day | OPHTHALMIC | Status: DC

## 2012-10-30 MED ORDER — POLYETHYLENE GLYCOL 3350 17 G PO PACK
17.0000 g | PACK | Freq: Every day | ORAL | Status: DC
Start: 2012-10-30 — End: 2012-10-30
  Administered 2012-10-30: 17 g via ORAL
  Filled 2012-10-30: qty 1

## 2012-10-30 NOTE — Progress Notes (Signed)
S:  No further hematochezia O:BP 164/76  Pulse 78  Temp(Src) 99 F (37.2 C) (Oral)  Resp 18  Ht 5\' 9"  (1.753 m)  Wt 92.701 kg (204 lb 5.9 oz)  BMI 30.17 kg/m2  SpO2 100%  Intake/Output Summary (Last 24 hours) at 10/30/12 0821 Last data filed at 10/30/12 0456  Gross per 24 hour  Intake   1630 ml  Output   1770 ml  Net   -140 ml   Weight change: 0 kg (0 lb) WJX:BJYNW and alert CVS:RRR Resp:clear Abd:+ BS NTND Ext:no edema on Rt. Lt leg in cast.  + bruit Lt ua AVF NEURO:CNI OX3 no asterixis   . brinzolamide  1 drop Right Eye BID   And  . brimonidine  1 drop Right Eye BID  . calcium acetate  1,334 mg Oral TID WC  . darbepoetin (ARANESP) injection - DIALYSIS  100 mcg Intravenous Q Sat-HD  . doxercalciferol  6 mcg Intravenous Q T,Th,Sa-HD  . ferric gluconate (FERRLECIT/NULECIT) IV  125 mg Intravenous Q T,Th,Sa-HD  . insulin aspart  0-9 Units Subcutaneous TID WC  . insulin glargine  10 Units Subcutaneous QHS  . multivitamin  1 tablet Oral QHS  . pantoprazole  40 mg Oral Q1200  . Travoprost (BAK Free)  1 drop Both Eyes QHS   No results found. BMET    Component Value Date/Time   NA 134* 10/27/2012 1900   K 5.9* 10/27/2012 1900   CL 98 10/27/2012 1900   CO2 21 10/27/2012 1900   GLUCOSE 173* 10/27/2012 1900   BUN 57* 10/27/2012 1900   CREATININE 12.68* 10/27/2012 1900   CALCIUM 8.9 10/27/2012 1900   GFRNONAA 4* 10/27/2012 1900   GFRAA 4* 10/27/2012 1900   CBC    Component Value Date/Time   WBC 6.6 10/29/2012 0750   RBC 2.61* 10/29/2012 0750   HGB 6.9* 10/29/2012 0750   HCT 21.1* 10/29/2012 0750   PLT 305 10/29/2012 0750   MCV 80.8 10/29/2012 0750   MCH 26.4 10/29/2012 0750   MCHC 32.7 10/29/2012 0750   RDW 18.0* 10/29/2012 0750   LYMPHSABS 1.9 09/09/2012 0710   MONOABS 0.6 09/09/2012 0710   EOSABS 0.1 09/09/2012 0710   BASOSABS 0.0 09/09/2012 0710     Assessment: 1. Rectal bleeding.  For colonoscopy today 2. HTN 3. DM 4. Anemia on aranesp 5. Sec HPTH on hectorol 6. ESRD  Plan: 1.CBC  pending.  If Hg reasonable then could be DC'd 2.  Will resume his labetalol as BP increasing  Oscar Castillo T

## 2012-10-30 NOTE — Discharge Summary (Signed)
Physician Discharge Summary  Oscar Castillo YNW:[id number] DOB: Nov 29, 1950 DOA: 10/27/2012  PCP: Oscar Barre, MD  Admit date: 10/27/2012 Discharge date: 10/30/2012  Time spent: 35 minutes  Recommendations for Outpatient Follow-up:  1. Needs repeat CBC to follow Hb. Please consider resume aspirin for stroke prevention if hb stable and no more bleeding.   Discharge Diagnoses:    GI bleed, hematochezia, secondary to hemorrhoids.    HYPERTENSION   HEPATITIS C, HX OF   CEREBROVASCULAR ACCIDENT, HX OF   BENIGN PROSTATIC HYPERTROPHY   Anemia due to chronic illness   LVH (left ventricular hypertrophy)-severe concentric   DM (diabetes mellitus) type I controlled with renal manifestation   CKD (chronic kidney disease) stage V requiring chronic dialysis   Mass in rectum colonoscopy negative, was probably stool in rectum.    Acute blood loss anemia   Chronic combined systolic (EF 45%) and grade 2diastolic congestive heart failure   Obstipation   Discharge Condition: stable.  Diet recommendation: Renal diet.   Filed Weights   10/29/12 6578 10/29/12 1113 10/29/12 2018  Weight: 94.4 kg (208 lb 1.8 oz) 92.7 kg (204 lb 5.9 oz) 92.701 kg (204 lb 5.9 oz)    History of present illness:  Oscar Castillo is a 62 y.o. male with PMH significant for ESRD on hemodialysis, Diabetes, CHF, hepatitis C, who presents to ED complaining of red blood in the stool. He was ready to go to dialysis today when he notice significant amount of blood in the stool. He say around a cup of blood. He denies any abdominal pain or any prior episodes. He denies nausea, vomiting, hematemesis. No family history of colon cancer. He has never had a colonoscopy or endoscopy. He had a CT scan abdomen with result as follow: Soft tissue mass fills the rectum, concerning for a rectal  carcinoma. Without intravenous contrast, this is difficult to differentiate from stool. Endoscopy suggested for further  Evaluation. GI and nephrologist already  consulted.    Hospital Course:  Acute blood loss anemia on Anemia due to chronic illness  -secondary to hemorrhoid bleed.  -Hb decrease to 6.9. Patient received 2 units PRBC 4-5 during dialysis. Repeat hb today at 8.8. No recurrence of GI bleed.  -He will need repeat CBC during dialysis. Will need to consider resume aspirin if Hb stable and no more hematochezia.  GI bleed  -Secondary to hemorrhoids.  -Miralx prescribe for constipation. ? Mass in rectum:  -Mass rectum was probably stool.  -no mass noted on colonoscopy  HYPERTENSION w/ severe concentric LVH  -controlled / well compensated. Discharge on prior home medications.  DM with renal manifestation  -currently controlled with SSI  -resume home Lantus.  CKD (chronic kidney disease) stage V requiring chronic dialysis  -He will need dialysis next Tuesday.  Chronic combined systolic (EF 45%) and grade 2 diastolic congestive heart failure  -compensated -  HEPATITIS C, HX OF  CEREBROVASCULAR ACCIDENT, HX OF  BENIGN PROSTATIC HYPERTROPHY   Procedures: Colonoscopy: 4/4 - hemorrhoids - no mass evident     Consultations: Dr Ewing Schlein. Dr Briant Cedar.  Discharge Exam: Filed Vitals:   10/29/12 1808 10/29/12 2018 10/30/12 0454 10/30/12 0730  BP: 152/86 180/76 164/76 156/85  Pulse: 78 85 78 78  Temp: 98.4 F (36.9 C) 98.9 F (37.2 C) 99 F (37.2 C) 98.6 F (37 C)  TempSrc: Oral Oral Oral Oral  Resp: 18 18 18 18   Height:  5\' 9"  (1.753 m)    Weight:  92.701 kg (204  lb 5.9 oz)    SpO2: 100% 100% 100% 100%    General: no distress.  Cardiovascular: S 1, S 2 RRR Respiratory: CTA  Discharge Instructions  Discharge Orders   Future Appointments Provider Department Dept Phone   04/05/2013 10:45 AM Corwin Levins, MD [facility] [phone]   Future Orders Complete By Expires     Increase activity slowly  As directed         Medication List    STOP taking these medications       aspirin 81 MG EC  tablet      TAKE these medications       acetaminophen 325 MG tablet  Commonly known as:  TYLENOL  Take 2 tablets (650 mg total) by mouth every 6 (six) hours as needed.     calcium acetate 667 MG capsule  Commonly known as:  PHOSLO  Take 2 capsules (1,334 mg total) by mouth 3 (three) times daily with meals.     insulin glargine 100 UNIT/ML injection  Commonly known as:  LANTUS SOLOSTAR  Inject 15 Units into the skin daily.     labetalol 300 MG tablet  Commonly known as:  NORMODYNE  Take 150 mg by mouth 2 (two) times daily.     pantoprazole 40 MG tablet  Commonly known as:  PROTONIX  Take 1 tablet (40 mg total) by mouth daily.     polyethylene glycol packet  Commonly known as:  MIRALAX / GLYCOLAX  Take 17 g by mouth daily.     SIMBRINZA 1-0.2 % Susp  Generic drug:  Brinzolamide-Brimonidine  Place 1 drop into the right eye 2 (two) times daily.     traMADol 50 MG tablet  Commonly known as:  ULTRAM  Take 1 tablet (50 mg total) by mouth every 8 (eight) hours as needed.     Travoprost (BAK Free) 0.004 % Soln ophthalmic solution  Commonly known as:  TRAVATAN  Place 1 drop into both eyes at bedtime.          The results of significant diagnostics from this hospitalization (including imaging, microbiology, ancillary and laboratory) are listed below for reference.    Significant Diagnostic Studies: Ct Abdomen Pelvis Wo Contrast  10/27/2012  *RADIOLOGY REPORT*  Clinical Data: Rectal bleeding  CT ABDOMEN AND PELVIS WITHOUT CONTRAST  Technique:  Multidetector CT imaging of the abdomen and pelvis was performed following the standard protocol without intravenous contrast.  Comparison: CT 11/06/2009  Findings: Lung bases are clear.  Unenhanced images of the liver gallbladder and bile ducts are normal.  Pancreas and spleen are normal.  No kidney stone is identified.  There is no renal mass or obstruction.  No retroperitoneal adenopathy.  Soft tissue mass fills the rectum.  This is  concerning for rectal carcinoma.  It is possible this represents stool.  Endoscopy is suggested for further evaluation.  No bowel obstruction.  Appendix is normal.  There is moderate stool in the colon.  Degenerative changes are seen throughout the lumbar spine with multiple Schmorl's nodes.  IMPRESSION: Soft tissue mass fills the rectum, concerning for a rectal carcinoma.  Without intravenous contrast, this is difficult to differentiate from stool.  Endoscopy suggested for further evaluation.   Original Report Authenticated By: Janeece Riggers, M.D.    Dg Chest Port 1 View  10/28/2012  *RADIOLOGY REPORT*  Clinical Data: Confusion, shortness of breath.  PORTABLE CHEST - 1 VIEW  Comparison: 09/07/2012.  Findings: Trachea is midline.  Heart  size stable.  Right IJ dialysis catheter tips project over the SVC.  Lungs are clear.  No pleural fluid.  IMPRESSION: No acute findings.   Original Report Authenticated By: Leanna Battles, M.D.     Microbiology: Recent Results (from the past 240 hour(s))  MRSA PCR SCREENING     Status: None   Collection Time    10/28/12  1:29 AM      Result Value Range Status   MRSA by PCR NEGATIVE  NEGATIVE Final   Comment:            The GeneXpert MRSA Assay (FDA     approved for NASAL specimens     only), is one component of a     comprehensive MRSA colonization     surveillance program. It is not     intended to diagnose MRSA     infection nor to guide or     monitor treatment for     MRSA infections.     Labs: Basic Metabolic Panel:  Recent Labs Lab 10/27/12 1236 10/27/12 1900  NA 136 134*  K 5.9* 5.9*  CL 98 98  CO2 25 21  GLUCOSE 210* 173*  BUN 52* 57*  CREATININE 12.64* 12.68*  CALCIUM 9.3 8.9  PHOS  --  6.1*   Liver Function Tests:  Recent Labs Lab 10/27/12 1236 10/27/12 1900  AST 14  --   ALT 9  --   ALKPHOS 72  --   BILITOT 0.2*  --   PROT 7.4  --   ALBUMIN 2.7* 2.6*   No results found for this basename: LIPASE, AMYLASE,  in the last 168  hours  Recent Labs Lab 10/28/12 0702  AMMONIA 17   CBC:  Recent Labs Lab 10/27/12 1236 10/27/12 1533 10/27/12 2325 10/28/12 0702 10/29/12 0750 10/30/12 0845  WBC 7.2  --   --   --  6.6 7.8  HGB 10.0* 10.0* 9.1* 8.7* 6.9* 8.8*  HCT 30.2*  --   --   --  21.1* 26.2*  MCV 81.8  --   --   --  80.8 82.9  PLT 449*  --   --   --  305 263   Cardiac Enzymes: No results found for this basename: CKTOTAL, CKMB, CKMBINDEX, TROPONINI,  in the last 168 hours BNP: BNP (last 3 results)  Recent Labs  06/11/12 0916  PROBNP 6937.0*   CBG:  Recent Labs Lab 10/28/12 0932 10/28/12 1323 10/28/12 1625 10/28/12 2205 10/29/12 1255  GLUCAP 115* 115* 116* 156* 121*       Signed:  Modell Fendrick  [facility] 10/30/2012, 12:15 PM

## 2012-10-31 ENCOUNTER — Encounter (HOSPITAL_COMMUNITY): Payer: Self-pay | Admitting: Gastroenterology

## 2012-10-31 DIAGNOSIS — Z4789 Encounter for other orthopedic aftercare: Secondary | ICD-10-CM | POA: Diagnosis not present

## 2012-11-01 ENCOUNTER — Encounter (HOSPITAL_COMMUNITY): Payer: Self-pay | Admitting: Physical Medicine and Rehabilitation

## 2012-11-01 ENCOUNTER — Emergency Department (HOSPITAL_COMMUNITY): Payer: Medicare Other

## 2012-11-01 ENCOUNTER — Inpatient Hospital Stay (HOSPITAL_COMMUNITY)
Admission: EM | Admit: 2012-11-01 | Discharge: 2012-11-13 | DRG: 377 | Disposition: A | Discharge status: Home / Self Care | Payer: Medicare Other | Attending: Family Medicine | Admitting: Family Medicine

## 2012-11-01 DIAGNOSIS — Z4901 Encounter for fitting and adjustment of extracorporeal dialysis catheter: Secondary | ICD-10-CM | POA: Diagnosis not present

## 2012-11-01 DIAGNOSIS — Z79899 Other long term (current) drug therapy: Secondary | ICD-10-CM | POA: Diagnosis not present

## 2012-11-01 DIAGNOSIS — I509 Heart failure, unspecified: Secondary | ICD-10-CM | POA: Diagnosis present

## 2012-11-01 DIAGNOSIS — N039 Chronic nephritic syndrome with unspecified morphologic changes: Secondary | ICD-10-CM | POA: Diagnosis not present

## 2012-11-01 DIAGNOSIS — N2581 Secondary hyperparathyroidism of renal origin: Secondary | ICD-10-CM | POA: Diagnosis present

## 2012-11-01 DIAGNOSIS — R935 Abnormal findings on diagnostic imaging of other abdominal regions, including retroperitoneum: Secondary | ICD-10-CM

## 2012-11-01 DIAGNOSIS — K921 Melena: Secondary | ICD-10-CM | POA: Diagnosis not present

## 2012-11-01 DIAGNOSIS — E1029 Type 1 diabetes mellitus with other diabetic kidney complication: Secondary | ICD-10-CM | POA: Diagnosis not present

## 2012-11-01 DIAGNOSIS — K648 Other hemorrhoids: Secondary | ICD-10-CM | POA: Diagnosis present

## 2012-11-01 DIAGNOSIS — D638 Anemia in other chronic diseases classified elsewhere: Secondary | ICD-10-CM | POA: Diagnosis not present

## 2012-11-01 DIAGNOSIS — Z794 Long term (current) use of insulin: Secondary | ICD-10-CM | POA: Diagnosis not present

## 2012-11-01 DIAGNOSIS — K922 Gastrointestinal hemorrhage, unspecified: Secondary | ICD-10-CM | POA: Diagnosis not present

## 2012-11-01 DIAGNOSIS — D62 Acute posthemorrhagic anemia: Secondary | ICD-10-CM | POA: Diagnosis present

## 2012-11-01 DIAGNOSIS — K6289 Other specified diseases of anus and rectum: Secondary | ICD-10-CM

## 2012-11-01 DIAGNOSIS — R112 Nausea with vomiting, unspecified: Secondary | ICD-10-CM | POA: Diagnosis not present

## 2012-11-01 DIAGNOSIS — N058 Unspecified nephritic syndrome with other morphologic changes: Secondary | ICD-10-CM | POA: Diagnosis present

## 2012-11-01 DIAGNOSIS — I5042 Chronic combined systolic (congestive) and diastolic (congestive) heart failure: Secondary | ICD-10-CM | POA: Diagnosis not present

## 2012-11-01 DIAGNOSIS — I5041 Acute combined systolic (congestive) and diastolic (congestive) heart failure: Secondary | ICD-10-CM | POA: Diagnosis not present

## 2012-11-01 DIAGNOSIS — N400 Enlarged prostate without lower urinary tract symptoms: Secondary | ICD-10-CM | POA: Diagnosis present

## 2012-11-01 DIAGNOSIS — I10 Essential (primary) hypertension: Secondary | ICD-10-CM

## 2012-11-01 DIAGNOSIS — E785 Hyperlipidemia, unspecified: Secondary | ICD-10-CM

## 2012-11-01 DIAGNOSIS — I120 Hypertensive chronic kidney disease with stage 5 chronic kidney disease or end stage renal disease: Secondary | ICD-10-CM | POA: Diagnosis not present

## 2012-11-01 DIAGNOSIS — Z8619 Personal history of other infectious and parasitic diseases: Secondary | ICD-10-CM

## 2012-11-01 DIAGNOSIS — K8020 Calculus of gallbladder without cholecystitis without obstruction: Secondary | ICD-10-CM

## 2012-11-01 DIAGNOSIS — I517 Cardiomegaly: Secondary | ICD-10-CM

## 2012-11-01 DIAGNOSIS — N186 End stage renal disease: Secondary | ICD-10-CM | POA: Diagnosis present

## 2012-11-01 DIAGNOSIS — Z8673 Personal history of transient ischemic attack (TIA), and cerebral infarction without residual deficits: Secondary | ICD-10-CM | POA: Diagnosis not present

## 2012-11-01 DIAGNOSIS — E1122 Type 2 diabetes mellitus with diabetic chronic kidney disease: Secondary | ICD-10-CM | POA: Diagnosis present

## 2012-11-01 DIAGNOSIS — F528 Other sexual dysfunction not due to a substance or known physiological condition: Secondary | ICD-10-CM

## 2012-11-01 DIAGNOSIS — I5043 Acute on chronic combined systolic (congestive) and diastolic (congestive) heart failure: Secondary | ICD-10-CM | POA: Diagnosis present

## 2012-11-01 DIAGNOSIS — N320 Bladder-neck obstruction: Secondary | ICD-10-CM

## 2012-11-01 DIAGNOSIS — K219 Gastro-esophageal reflux disease without esophagitis: Secondary | ICD-10-CM | POA: Diagnosis present

## 2012-11-01 DIAGNOSIS — R1032 Left lower quadrant pain: Secondary | ICD-10-CM | POA: Diagnosis not present

## 2012-11-01 DIAGNOSIS — D500 Iron deficiency anemia secondary to blood loss (chronic): Secondary | ICD-10-CM | POA: Diagnosis not present

## 2012-11-01 DIAGNOSIS — I110 Hypertensive heart disease with heart failure: Secondary | ICD-10-CM | POA: Diagnosis present

## 2012-11-01 DIAGNOSIS — K5900 Constipation, unspecified: Secondary | ICD-10-CM

## 2012-11-01 DIAGNOSIS — Z8679 Personal history of other diseases of the circulatory system: Secondary | ICD-10-CM

## 2012-11-01 DIAGNOSIS — Z87442 Personal history of urinary calculi: Secondary | ICD-10-CM

## 2012-11-01 DIAGNOSIS — Z992 Dependence on renal dialysis: Secondary | ICD-10-CM

## 2012-11-01 DIAGNOSIS — K625 Hemorrhage of anus and rectum: Secondary | ICD-10-CM | POA: Diagnosis not present

## 2012-11-01 DIAGNOSIS — F329 Major depressive disorder, single episode, unspecified: Secondary | ICD-10-CM

## 2012-11-01 LAB — RENAL FUNCTION PANEL
BUN: 52 mg/dL — ABNORMAL HIGH (ref 6–23)
CO2: 23 mEq/L (ref 19–32)
Chloride: 100 mEq/L (ref 96–112)
Creatinine, Ser: 10.73 mg/dL — ABNORMAL HIGH (ref 0.50–1.35)

## 2012-11-01 LAB — CBC
HCT: 23.3 % — ABNORMAL LOW (ref 39.0–52.0)
HCT: 22 % — ABNORMAL LOW (ref 39.0–52.0)
HCT: 20.6 % — ABNORMAL LOW (ref 39.0–52.0)
HCT: 27.4 % — ABNORMAL LOW (ref 39.0–52.0)
Hemoglobin: 9.3 g/dL — ABNORMAL LOW (ref 13.0–17.0)
MCH: 28 pg (ref 26.0–34.0)
MCH: 28.3 pg (ref 26.0–34.0)
MCHC: 33.9 g/dL (ref 30.0–36.0)
MCV: 84.4 fL (ref 78.0–100.0)
MCV: 84.3 fL (ref 78.0–100.0)
MCV: 83.7 fL (ref 78.0–100.0)
RBC: 2.76 MIL/uL — ABNORMAL LOW (ref 4.22–5.81)
RBC: 2.46 MIL/uL — ABNORMAL LOW (ref 4.22–5.81)
RDW: 18 % — ABNORMAL HIGH (ref 11.5–15.5)
RDW: 18.2 % — ABNORMAL HIGH (ref 11.5–15.5)
WBC: 8.1 10*3/uL (ref 4.0–10.5)
WBC: 9.4 10*3/uL (ref 4.0–10.5)
WBC: 7.9 10*3/uL (ref 4.0–10.5)

## 2012-11-01 MED ORDER — SODIUM CHLORIDE 0.9 % IJ SOLN: 3.0000 mL | Freq: Two times a day (BID) | INTRAMUSCULAR | Status: DC

## 2012-11-01 MED ORDER — PENTAFLUOROPROP-TETRAFLUOROETH EX AERO: 1.0000 "application " | INHALATION_SPRAY | CUTANEOUS | Status: DC | PRN

## 2012-11-01 MED ORDER — SODIUM CHLORIDE 0.9 % IV SOLN: 100.0000 mL | INTRAVENOUS | Status: DC | PRN

## 2012-11-01 MED ORDER — CALCIUM ACETATE 667 MG PO CAPS
1334.0000 mg | ORAL_CAPSULE | Freq: Three times a day (TID) | ORAL | Status: DC
  Administered 2012-11-02 – 2012-11-13 (×20): 1334 mg via ORAL
  Filled 2012-11-01 (×39): qty 2

## 2012-11-01 MED ORDER — ALTEPLASE 2 MG IJ SOLR
2.0000 mg | Freq: Once | INTRAMUSCULAR | Status: DC | PRN
  Filled 2012-11-01: qty 2

## 2012-11-01 MED ORDER — PANTOPRAZOLE SODIUM 40 MG PO TBEC
40.0000 mg | DELAYED_RELEASE_TABLET | Freq: Every day | ORAL | Status: DC
  Administered 2012-11-02 – 2012-11-13 (×9): 40 mg via ORAL
  Filled 2012-11-01 (×7): qty 1

## 2012-11-01 MED ORDER — SODIUM CHLORIDE 0.9 % IV SOLN
125.0000 mg | INTRAVENOUS | Status: DC
  Administered 2012-11-03 – 2012-11-12 (×5): 125 mg via INTRAVENOUS
  Filled 2012-11-01 (×9): qty 10

## 2012-11-01 MED ORDER — SODIUM CHLORIDE 0.9 % IJ SOLN: 3.0000 mL | INTRAMUSCULAR | Status: DC | PRN

## 2012-11-01 MED ORDER — SODIUM CHLORIDE 0.9 % IV SOLN: 250.0000 mL | INTRAVENOUS | Status: DC | PRN

## 2012-11-01 MED ORDER — ACETAMINOPHEN 325 MG PO TABS
650.0000 mg | ORAL_TABLET | Freq: Four times a day (QID) | ORAL | Status: DC | PRN
  Administered 2012-11-02: 650 mg via ORAL
  Filled 2012-11-01: qty 2

## 2012-11-01 MED ORDER — BRIMONIDINE TARTRATE 0.2 % OP SOLN
1.0000 [drp] | Freq: Two times a day (BID) | OPHTHALMIC | Status: DC
  Administered 2012-11-01 – 2012-11-13 (×23): 1 [drp] via OPHTHALMIC
  Filled 2012-11-01 (×2): qty 5

## 2012-11-01 MED ORDER — NEPRO/CARBSTEADY PO LIQD
237.0000 mL | ORAL | Status: DC | PRN
  Filled 2012-11-01: qty 237

## 2012-11-01 MED ORDER — BRINZOLAMIDE-BRIMONIDINE 1-0.2 % OP SUSP: 1.0000 [drp] | Freq: Two times a day (BID) | OPHTHALMIC | Status: DC

## 2012-11-01 MED ORDER — DARBEPOETIN ALFA-POLYSORBATE 200 MCG/0.4ML IJ SOLN
200.0000 ug | INTRAMUSCULAR | Status: DC
  Filled 2012-11-01: qty 0.4

## 2012-11-01 MED ORDER — HEPARIN SODIUM (PORCINE) 1000 UNIT/ML DIALYSIS: 1000.0000 [IU] | INTRAMUSCULAR | Status: DC | PRN

## 2012-11-01 MED ORDER — CALCIUM ACETATE 667 MG PO CAPS: 1334.0000 mg | ORAL_CAPSULE | Freq: Three times a day (TID) | ORAL | Status: DC

## 2012-11-01 MED ORDER — LIDOCAINE-PRILOCAINE 2.5-2.5 % EX CREA
1.0000 "application " | TOPICAL_CREAM | CUTANEOUS | Status: DC | PRN
  Filled 2012-11-01: qty 5

## 2012-11-01 MED ORDER — LIDOCAINE HCL (PF) 1 % IJ SOLN: 5.0000 mL | INTRAMUSCULAR | Status: DC | PRN

## 2012-11-01 MED ORDER — BRINZOLAMIDE 1 % OP SUSP
1.0000 [drp] | Freq: Two times a day (BID) | OPHTHALMIC | Status: DC
  Administered 2012-11-01 – 2012-11-13 (×22): 1 [drp] via OPHTHALMIC
  Filled 2012-11-01 (×3): qty 10

## 2012-11-01 MED ORDER — SODIUM CHLORIDE 0.9 % IV BOLUS (SEPSIS)
500.0000 mL | Freq: Once | INTRAVENOUS | Status: AC
  Administered 2012-11-01: 500 mL via INTRAVENOUS

## 2012-11-01 MED ORDER — TRAVOPROST (BAK FREE) 0.004 % OP SOLN
1.0000 [drp] | Freq: Every day | OPHTHALMIC | Status: DC
Start: 2012-11-01 — End: 2012-11-13
  Administered 2012-11-01 – 2012-11-12 (×11): 1 [drp] via OPHTHALMIC
  Filled 2012-11-01 (×2): qty 2.5

## 2012-11-01 MED ORDER — ONDANSETRON HCL 4 MG/2ML IJ SOLN
4.0000 mg | Freq: Once | INTRAMUSCULAR | Status: AC
  Administered 2012-11-01: 4 mg via INTRAVENOUS
  Filled 2012-11-01: qty 2

## 2012-11-01 MED ORDER — INSULIN ASPART 100 UNIT/ML ~~LOC~~ SOLN
0.0000 [IU] | Freq: Three times a day (TID) | SUBCUTANEOUS | Status: DC
  Administered 2012-11-02: 1 [IU] via SUBCUTANEOUS
  Administered 2012-11-02: 2 [IU] via SUBCUTANEOUS
  Administered 2012-11-03 – 2012-11-04 (×2): 1 [IU] via SUBCUTANEOUS
  Administered 2012-11-04: 2 [IU] via SUBCUTANEOUS
  Administered 2012-11-06: 1 [IU] via SUBCUTANEOUS
  Administered 2012-11-07: 2 [IU] via SUBCUTANEOUS
  Administered 2012-11-07: 1 [IU] via SUBCUTANEOUS
  Administered 2012-11-09: 2 [IU] via SUBCUTANEOUS
  Administered 2012-11-09: 1 [IU] via SUBCUTANEOUS
  Administered 2012-11-09: 3 [IU] via SUBCUTANEOUS
  Administered 2012-11-10 – 2012-11-11 (×3): 2 [IU] via SUBCUTANEOUS
  Administered 2012-11-11 – 2012-11-12 (×2): 3 [IU] via SUBCUTANEOUS
  Administered 2012-11-12: 1 [IU] via SUBCUTANEOUS
  Administered 2012-11-13: 3 [IU] via SUBCUTANEOUS
  Administered 2012-11-13: 1 [IU] via SUBCUTANEOUS

## 2012-11-01 MED ORDER — INSULIN GLARGINE 100 UNIT/ML ~~LOC~~ SOLN
15.0000 [IU] | Freq: Every day | SUBCUTANEOUS | Status: DC
  Administered 2012-11-02: 15 [IU] via SUBCUTANEOUS
  Filled 2012-11-01 (×4): qty 0.15

## 2012-11-01 MED ORDER — POLYETHYLENE GLYCOL 3350 17 G PO PACK
17.0000 g | PACK | Freq: Every day | ORAL | Status: DC
  Administered 2012-11-02 – 2012-11-13 (×8): 17 g via ORAL
  Filled 2012-11-01 (×13): qty 1

## 2012-11-01 NOTE — Progress Notes (Signed)
Hemodialysis- clarified orders with C.Lyles PA. Pt is to receive 2 units of PRBCs on HD.

## 2012-11-01 NOTE — Consult Note (Signed)
Oscar Castillo [birth year]  [id number].   Requesting MD: Dr. Jeraldine Loots Chief Complaint/Reason for Consult: LGI bleed secondary to hemorrhoids HPI: this is a 62 yo black male with ESRD, h/o Hep C, HTN, DM, CVA, CHF, who just got discharge from the hospital 2 days ago for LGI bleed found to be secondary to internal hemorrhoids on c-scope.  The patient states he started having BRBPR again this morning.  Upon arrival he was passing quite a bit of blood and clots.  We have been asked to see for further evaluation of these hemorrhoids.  Review of Systems: Unable to obtain as patient will not communicate with me.  He does say he doesn't have abdominal pain.  Family History  Problem Relation Age of Onset  . Coronary artery disease Other   . Diabetes Other     Past Medical History  Diagnosis Date  . ALLERGIC RHINITIS 05/05/2007  . BACK PAIN, LUMBAR, CHRONIC 08/06/2009  . BENIGN PROSTATIC HYPERTROPHY 08/01/2010  . CEREBROVASCULAR ACCIDENT, HX OF 08/06/2009  . CHOLELITHIASIS 08/01/2010  . CONGESTIVE HEART FAILURE 03/18/2009  . DEPRESSION 03/18/2009  . DIABETES MELLITUS, TYPE II 03/25/2007  . ERECTILE DYSFUNCTION 03/25/2007  . GERD 03/25/2007  . HEPATITIS C, HX OF 03/25/2007  . HYPERTENSION 03/25/2007  . Morbid obesity 03/25/2007  . NEPHROLITHIASIS, HX OF 03/18/2009  . ESRD on hemodialysis 09/08/2012    ESRD due to DM/HTN. Started dialysis in November 2013. Has LUA AVF placed Jun 14 2012 which is maturing as of Feb 2014.   [name] Renal insufficiency     Past Surgical History  Procedure Laterality Date  . Nephrectomy      partial RR  . Av fistula placement  06/14/2012    Procedure: ARTERIOVENOUS (AV) FISTULA CREATION;  Surgeon: Chuck Hint, MD;  Location: [facility] OR;  Service: Vascular;  Laterality: Left;  Left basilic vein transposition with fistula.  . Tibia im nail insertion Left 09/09/2012    Procedure: INTRAMEDULLARY (IM) NAIL TIBIAL;  Surgeon: Eulas Post, MD;  Location: [facility] OR;  Service:  Orthopedics;  Laterality: Left;  left tibial nail and open reduction internal fixation left fibula fracture  . Orif fibula fracture Left 09/09/2012    Procedure: OPEN REDUCTION INTERNAL FIXATION (ORIF) FIBULA FRACTURE;  Surgeon: Eulas Post, MD;  Location: [facility] OR;  Service: Orthopedics;  Laterality: Left;  . Colonoscopy N/A 10/28/2012    Procedure: COLONOSCOPY;  Surgeon: Petra Kuba, MD;  Location: [facility] ENDOSCOPY;  Service: Endoscopy;  Laterality: N/A;    Social History:  reports that he has never smoked. He does not have any smokeless tobacco history on file. He reports that he does not drink alcohol or use illicit drugs.  Allergies: No Known Allergies   (Not in a hospital admission)  Blood pressure 144/81, pulse 71, temperature 99.4 F (37.4 C), temperature source Oral, resp. rate 18, SpO2 100.00%. Physical Exam: General: black male who is laying in bed in NAD, but does not verbalize HEENT: head is normocephalic, atraumatic.  Sclera are noninjected.  PERRL.  Ears and nose without any masses or lesions.  Mouth is pink and moist Heart: regular, rate, and rhythm.  Normal s1,s2. No obvious murmurs, gallops, or rubs noted.  Palpable radial and pedal pulses bilaterally Lungs: CTAB, no wheezes, rhonchi, or rales noted.  Respiratory effort nonlabored Abd: soft, NT, ND, +BS, no masses, hernias, or organomegaly Rectal: clot noted on outside of anus.  No external hemorrhoids noted.  DRE performed with good tone.  Some palpable  internal hemorrhoids noted particularly around 8-9 oclock Skin: warm and dry with no masses, lesions, or rashes Psych: depressed affect, mostly non-verbal, by choice I think    Results for orders placed during the hospital encounter of 11/01/12 (from the past 48 hour(s))  CBC     Status: Abnormal   Collection Time    11/01/12  9:30 AM      Result Value Range   WBC 8.1  4.0 - 10.5 K/uL   RBC 2.76 (*) 4.22 - 5.81 MIL/uL   Hemoglobin 7.7 (*) 13.0 - 17.0 g/dL   HCT 16.1  (*) 09.6 - 52.0 %   MCV 84.4  78.0 - 100.0 fL   MCH 27.9  26.0 - 34.0 pg   MCHC 33.0  30.0 - 36.0 g/dL   RDW 04.5 (*) 40.9 - 81.1 %   Platelets 337  150 - 400 K/uL  RENAL FUNCTION PANEL     Status: Abnormal   Collection Time    11/01/12  9:30 AM      Result Value Range   Sodium 136  135 - 145 mEq/L   Potassium 4.7  3.5 - 5.1 mEq/L   Chloride 100  96 - 112 mEq/L   CO2 23  19 - 32 mEq/L   Glucose, Bld 184 (*) 70 - 99 mg/dL   BUN 52 (*) 6 - 23 mg/dL   Creatinine, Ser 91.47 (*) 0.50 - 1.35 mg/dL   Calcium 9.1  8.4 - 82.9 mg/dL   Phosphorus 4.6  2.3 - 4.6 mg/dL   Albumin 2.6 (*) 3.5 - 5.2 g/dL   GFR calc non Af Amer 4 (*) >90 mL/min   GFR calc Af Amer 5 (*) >90 mL/min   Comment:            The eGFR has been calculated     using the CKD EPI equation.     This calculation has not been     validated in all clinical     situations.     eGFR's persistently     <90 mL/min signify     possible Chronic Kidney Disease.  PREPARE RBC (CROSSMATCH)     Status: None   Collection Time    11/01/12 10:41 AM      Result Value Range   Order Confirmation ORDER PROCESSED BY BLOOD BANK    TYPE AND SCREEN     Status: None   Collection Time    11/01/12 10:41 AM      Result Value Range   ABO/RH(D) O POS     Antibody Screen NEG     Sample Expiration 11/04/2012     Unit Number [id number]     Blood Component Type RED CELLS,LR     Unit division 00     Status of Unit ALLOCATED     Transfusion Status OK TO TRANSFUSE     Crossmatch Result Compatible     Unit Number [id number]     Blood Component Type RBC LR PHER1     Unit division 00     Status of Unit ALLOCATED     Transfusion Status OK TO TRANSFUSE     Crossmatch Result Compatible     Dg Abd 1 View  11/01/2012  *RADIOLOGY REPORT*  Clinical Data: Left lower quadrant abdominal pain.  Nausea and vomiting.  Rectal bleeding.  ABDOMEN - 1 VIEW  Comparison: CT abdomen and pelvis 10/27/2012.  Findings: Bowel gas pattern unremarkable without  evidence of obstruction or significant  ileus.  Gas and moderate stool burden throughout normal caliber colon.  Phleboliths in the pelvis.  No visible right urinary tract calculi.  Mild degenerative changes involving the lumbar spine.  IMPRESSION: No acute abdominal abnormality.  I note that the recent CT demonstrated a rectal mass which likely explains the rectal bleeding.   Original Report Authenticated By: Hulan Saas, M.D.        Assessment/Plan 1. LGI bleed secondary to internal hemorrhoids 2. ESRD 3. H/o Hep C 4. DM 5. HTN  Plan: 1. Will plan to do a bedside anoscope and hemorrhoidal banding today.  Will follow along.  Kayhan Boardley E 11/01/2012, 11:56 AM Pager: [phone]

## 2012-11-01 NOTE — Progress Notes (Signed)
Pt arrived from ED, VSS, oriented to unit and routine, call bell within reach, wife currently at bedside. HD later today per MD orders, will receive blood while there.

## 2012-11-01 NOTE — Consult Note (Signed)
Agree with above.  His colonoscopy previously really didn't look like these hemorrhoids were bleeding from pictures that are on computer.  I did rectal exam today and bedside anoscopy.  Anoscopy showed mild internal hemorrhoids (he really didn't have external hemorrhoids) and there is dark blood and clots coming from above my anoscope.  I don't think this is hemorrhoidal bleeding. Have discussed with Dr. Matthias Hughs who will see as well.

## 2012-11-01 NOTE — H&P (Signed)
[facility] History and Physical  Oscar Castillo AVW:[id number] DOB: 26-Nov-1950 DOA: 11/01/2012  Referring physician: Dr. Jeraldine Loots PCP: Oliver Barre, MD  Specialists: On prior admission evaluated by Dr. Vida Rigger with GI. General surgery consulted this admission by ED.  Chief Complaint: BRBPR  HPI: Oscar Castillo is a 62 y.o. male  With h/o ESRD on HD tues, thurs, Saturday.  Was recently admitted to [facility] to GI bleed which on prior admission was worked up with colonoscopy which was negative except for hemorrhoids.  Patient discharged on 10/30/12 and presents today 11/01/12 with the same complaint.  Which occurred after patient had a large BM according to family member in room.  The history is obtained from EMR and family member as patient is somnolent at this time with limited one word responses to questions.  In the ED patient was found to have a hgb of 7.7 compared to last prior to d/c which was 8.8.  In the ED general surgery was consulted after GI made the following recommendation given negative colonoscopy.  We were consulted for medical evaluation for admission and further recommendations.  Review of Systems: Unable to obtain due to somnolence.  Past Medical History  Diagnosis Date  . ALLERGIC RHINITIS 05/05/2007  . BACK PAIN, LUMBAR, CHRONIC 08/06/2009  . BENIGN PROSTATIC HYPERTROPHY 08/01/2010  . CEREBROVASCULAR ACCIDENT, HX OF 08/06/2009  . CHOLELITHIASIS 08/01/2010  . CONGESTIVE HEART FAILURE 03/18/2009  . DEPRESSION 03/18/2009  . DIABETES MELLITUS, TYPE II 03/25/2007  . ERECTILE DYSFUNCTION 03/25/2007  . GERD 03/25/2007  . HEPATITIS C, HX OF 03/25/2007  . HYPERTENSION 03/25/2007  . Morbid obesity 03/25/2007  . NEPHROLITHIASIS, HX OF 03/18/2009  . ESRD on hemodialysis 09/08/2012    ESRD due to DM/HTN. Started dialysis in November 2013. Has LUA AVF placed Jun 14 2012 which is maturing as of Feb 2014.   [name] Renal insufficiency    Past Surgical History  Procedure Laterality Date  .  Nephrectomy      partial RR  . Av fistula placement  06/14/2012    Procedure: ARTERIOVENOUS (AV) FISTULA CREATION;  Surgeon: Chuck Hint, MD;  Location: [facility] OR;  Service: Vascular;  Laterality: Left;  Left basilic vein transposition with fistula.  . Tibia im nail insertion Left 09/09/2012    Procedure: INTRAMEDULLARY (IM) NAIL TIBIAL;  Surgeon: Eulas Post, MD;  Location: [facility] OR;  Service: Orthopedics;  Laterality: Left;  left tibial nail and open reduction internal fixation left fibula fracture  . Orif fibula fracture Left 09/09/2012    Procedure: OPEN REDUCTION INTERNAL FIXATION (ORIF) FIBULA FRACTURE;  Surgeon: Eulas Post, MD;  Location: [facility] OR;  Service: Orthopedics;  Laterality: Left;  . Colonoscopy N/A 10/28/2012    Procedure: COLONOSCOPY;  Surgeon: Petra Kuba, MD;  Location: [facility] ENDOSCOPY;  Service: Endoscopy;  Laterality: N/A;   Social History:  reports that he has never smoked. He does not have any smokeless tobacco history on file. He reports that he does not drink alcohol or use illicit drugs.  Can patient participate in ADLs? Limited currently  No Known Allergies  Family History  Problem Relation Age of Onset  . Coronary artery disease Other   . Diabetes Other   no other family history reported.  Prior to Admission medications   Medication Sig Start Date End Date Taking? Authorizing Provider  acetaminophen (TYLENOL) 325 MG tablet Take 650 mg by mouth every 6 (six) hours as needed for pain.   Yes Historical Provider, MD  Brinzolamide-Brimonidine (SIMBRINZA) 1-0.2 % SUSP Place 1 drop into the right eye 2 (two) times daily.   Yes Historical Provider, MD  calcium acetate (PHOSLO) 667 MG capsule Take 2 capsules (1,334 mg total) by mouth 3 (three) times daily with meals. 06/21/12  Yes Sorin Luanne Bras, MD  insulin glargine (LANTUS SOLOSTAR) 100 UNIT/ML injection Inject 15 Units into the skin daily. 10/03/12  Yes Corwin Levins, MD  labetalol (NORMODYNE) 300 MG tablet Take 150  mg by mouth 2 (two) times daily.   Yes Historical Provider, MD  pantoprazole (PROTONIX) 40 MG tablet Take 1 tablet (40 mg total) by mouth daily. 09/12/12  Yes Sorin Luanne Bras, MD  polyethylene glycol (MIRALAX / GLYCOLAX) packet Take 17 g by mouth daily. 10/30/12  Yes Belkys A Regalado, MD  traMADol (ULTRAM) 50 MG tablet Take 50 mg by mouth every 8 (eight) hours as needed for pain.   Yes Historical Provider, MD  Travoprost, BAK Free, (TRAVATAN) 0.004 % SOLN ophthalmic solution Place 1 drop into both eyes at bedtime. 10/30/12  Yes Alba Cory, MD   Physical Exam: Filed Vitals:   11/01/12 0922 11/01/12 1035  BP: 150/90 125/72  Pulse: 83 75  Temp: 99.4 F (37.4 C)   TempSrc: Oral   Resp: 18 18  SpO2: 100% 100%     General:  Pt somnolent, in NAD, arousable  Eyes: EOMI, non icteric  ENT: normal exterior appearance, no masses on visual examination  Neck: supple, no goiter  Cardiovascular: RRR, No rubs  Respiratory: no increased work of breathing, no wheezes  Abdomen: soft, NT, ND  Skin: warm and dry  Musculoskeletal: no cyanosis, LLE in cast  Psychiatric: unable to properly assess due to limited cooperation with examiner. As patient limited responses to one word  Neurologic: moves all extremities, no facial asymmetry  Labs on Admission:  Basic Metabolic Panel:  Recent Labs Lab 10/27/12 1236 10/27/12 1900 11/01/12 0930  NA 136 134* 136  K 5.9* 5.9* 4.7  CL 98 98 100  CO2 25 21 23   GLUCOSE 210* 173* 184*  BUN 52* 57* 52*  CREATININE 12.64* 12.68* 10.73*  CALCIUM 9.3 8.9 9.1  PHOS  --  6.1* 4.6   Liver Function Tests:  Recent Labs Lab 10/27/12 1236 10/27/12 1900 11/01/12 0930  AST 14  --   --   ALT 9  --   --   ALKPHOS 72  --   --   BILITOT 0.2*  --   --   PROT 7.4  --   --   ALBUMIN 2.7* 2.6* 2.6*   No results found for this basename: LIPASE, AMYLASE,  in the last 168 hours  Recent Labs Lab 10/28/12 0702  AMMONIA 17   CBC:  Recent Labs Lab  10/27/12 1236  10/27/12 2325 10/28/12 0702 10/29/12 0750 10/30/12 0845 11/01/12 0930  WBC 7.2  --   --   --  6.6 7.8 8.1  HGB 10.0*  < > 9.1* 8.7* 6.9* 8.8* 7.7*  HCT 30.2*  --   --   --  21.1* 26.2* 23.3*  MCV 81.8  --   --   --  80.8 82.9 84.4  PLT 449*  --   --   --  305 263 337  < > = values in this interval not displayed. Cardiac Enzymes: No results found for this basename: CKTOTAL, CKMB, CKMBINDEX, TROPONINI,  in the last 168 hours  BNP (last 3 results)  Recent Labs  06/11/12 0916  PROBNP 6937.0*   CBG:  Recent Labs Lab 10/29/12 1255 10/29/12 1632 10/29/12 2022 10/30/12 0744 10/30/12 1131  GLUCAP 121* 184* 164* 140* 163*    Radiological Exams on Admission: Dg Abd 1 View  11/01/2012  *RADIOLOGY REPORT*  Clinical Data: Left lower quadrant abdominal pain.  Nausea and vomiting.  Rectal bleeding.  ABDOMEN - 1 VIEW  Comparison: CT abdomen and pelvis 10/27/2012.  Findings: Bowel gas pattern unremarkable without evidence of obstruction or significant ileus.  Gas and moderate stool burden throughout normal caliber colon.  Phleboliths in the pelvis.  No visible right urinary tract calculi.  Mild degenerative changes involving the lumbar spine.  IMPRESSION: No acute abdominal abnormality.  I note that the recent CT demonstrated a rectal mass which likely explains the rectal bleeding.   Original Report Authenticated By: Hulan Saas, M.D.     EKG: Independently reviewed. EKG unchanged from prior with NSR and inverted T waves in lateral leads  Assessment/Plan Active Problems:   1. Acute blood loss anemia/GI bleed - General surgery on board - Agree with 2 units of PRBC's, would run over 4 hours each. - Most likely due to hemorrhoids given recent Colonoscopy on 4/4 which was negative for masses - will monitor cbc serially  - place in step down  2. DM - continue diabetic diet - SSI - Continue home regimen Lantus - monitor blood sugars  3. ESRD on HD - consulted  Nephrology given that patient will require transfusions and will need HD while in house.  4. HTN - Given # 1 will hold b blocker at this juncture - continue home regimen if BP's elevated  5. GERD - Continue home regimen protonix  6. DVT prophylaxis - unable to use SCD's due to recent fracture of LLE and unable to use heparin due to active bleed.  Will monitor closely and recommend early ambulation once condition improves.   Code Status:  full Family Communication: spoke with significant other at bedside. Disposition Plan: Pending recommendations from specialist involved and cessation of bleed  Time spent: > 60 minutes  Penny Pia [facility] Pager [phone]  If 7PM-7AM, please contact night-coverage [email or web address] Password [facility] 11/01/2012, 11:35 AM

## 2012-11-01 NOTE — ED Provider Notes (Signed)
History     CSN: [id number]  Arrival date & time 11/01/12  0906   None     Chief Complaint  Patient presents with  . Rectal Bleeding    (Consider location/radiation/quality/duration/timing/severity/associated sxs/prior treatment) HPI 62 yo M with ESRD on T/T/S dialysis, CHF, CVA, DM, HTN, admission last week for rectal bleeding presenting today with recurrent rectal bleeding.  History obtained mostly from wife.  He was discharged on Sunday, started Miralax on Monday and had first BM this morning at 5:30am which had gross blood and clots in it.  Wife reports that is was a loose stool.  No pain with BM, no straining.  Currently wearing diaper due to a leg surgery and mobility issues, not incontinence.  Patient does have some mild shortness of breath, mild LLQ and nausea with NBNB emesis for the last 1 hour.  Denies dizziness, chest pain, diaphoresis, fevers, chills.  Colonoscopy on 10/28/12 showed internal/external hemorrhoids only.  He is due for dialysis today.  Past Medical History  Diagnosis Date  . ALLERGIC RHINITIS 05/05/2007  . BACK PAIN, LUMBAR, CHRONIC 08/06/2009  . BENIGN PROSTATIC HYPERTROPHY 08/01/2010  . CEREBROVASCULAR ACCIDENT, HX OF 08/06/2009  . CHOLELITHIASIS 08/01/2010  . CONGESTIVE HEART FAILURE 03/18/2009  . DEPRESSION 03/18/2009  . DIABETES MELLITUS, TYPE II 03/25/2007  . ERECTILE DYSFUNCTION 03/25/2007  . GERD 03/25/2007  . HEPATITIS C, HX OF 03/25/2007  . HYPERTENSION 03/25/2007  . Morbid obesity 03/25/2007  . NEPHROLITHIASIS, HX OF 03/18/2009  . ESRD on hemodialysis 09/08/2012    ESRD due to DM/HTN. Started dialysis in November 2013. Has LUA AVF placed Jun 14 2012 which is maturing as of Feb 2014.   [name] Renal insufficiency     Past Surgical History  Procedure Laterality Date  . Nephrectomy      partial RR  . Av fistula placement  06/14/2012    Procedure: ARTERIOVENOUS (AV) FISTULA CREATION;  Surgeon: Chuck Hint, MD;  Location: [facility] OR;  Service: Vascular;   Laterality: Left;  Left basilic vein transposition with fistula.  . Tibia im nail insertion Left 09/09/2012    Procedure: INTRAMEDULLARY (IM) NAIL TIBIAL;  Surgeon: Eulas Post, MD;  Location: [facility] OR;  Service: Orthopedics;  Laterality: Left;  left tibial nail and open reduction internal fixation left fibula fracture  . Orif fibula fracture Left 09/09/2012    Procedure: OPEN REDUCTION INTERNAL FIXATION (ORIF) FIBULA FRACTURE;  Surgeon: Eulas Post, MD;  Location: [facility] OR;  Service: Orthopedics;  Laterality: Left;  . Colonoscopy N/A 10/28/2012    Procedure: COLONOSCOPY;  Surgeon: Petra Kuba, MD;  Location: [facility] ENDOSCOPY;  Service: Endoscopy;  Laterality: N/A;    Family History  Problem Relation Age of Onset  . Coronary artery disease Other   . Diabetes Other     History  Substance Use Topics  . Smoking status: Never Smoker   . Smokeless tobacco: Not on file  . Alcohol Use: No      Review of Systems  Constitutional: Negative for fever, chills and diaphoresis.  HENT: Negative.   Eyes: Negative.   Respiratory: Positive for shortness of breath. Negative for cough.   Cardiovascular: Negative for chest pain, palpitations and leg swelling.  Gastrointestinal: Positive for nausea, vomiting, abdominal pain (LLQ), diarrhea, blood in stool and anal bleeding. Negative for rectal pain.  Genitourinary: Negative.   Skin: Negative for rash.  Neurological: Negative for dizziness, syncope and headaches.    Allergies  Review of patient's allergies indicates no known allergies.  Home Medications   Current Outpatient Rx  Name  Route  Sig  Dispense  Refill  . Brinzolamide-Brimonidine (SIMBRINZA) 1-0.2 % SUSP   Right Eye   Place 1 drop into the right eye 2 (two) times daily.         . calcium acetate (PHOSLO) 667 MG capsule   Oral   Take 2 capsules (1,334 mg total) by mouth 3 (three) times daily with meals.   180 capsule   0   . insulin glargine (LANTUS SOLOSTAR) 100 UNIT/ML  injection   Subcutaneous   Inject 15 Units into the skin daily.   5 pen   PRN   . labetalol (NORMODYNE) 300 MG tablet   Oral   Take 150 mg by mouth 2 (two) times daily.         . pantoprazole (PROTONIX) 40 MG tablet   Oral   Take 1 tablet (40 mg total) by mouth daily.         . polyethylene glycol (MIRALAX / GLYCOLAX) packet   Oral   Take 17 g by mouth daily.   14 each   0   . traMADol (ULTRAM) 50 MG tablet   Oral   Take 1 tablet (50 mg total) by mouth every 8 (eight) hours as needed.   90 tablet   0   . Travoprost, BAK Free, (TRAVATAN) 0.004 % SOLN ophthalmic solution   Both Eyes   Place 1 drop into both eyes at bedtime.   1 Bottle   0     BP 150/90  Pulse 83  Temp(Src) 99.4 F (37.4 C) (Oral)  Resp 18  SpO2 100%  Physical Exam  Constitutional: He is oriented to person, place, and time. He appears well-developed and well-nourished. No distress.  HENT:  Head: Normocephalic and atraumatic.  Right Ear: External ear normal.  Left Ear: External ear normal.  Mouth/Throat: Oropharynx is clear and moist. No oropharyngeal exudate.  Eyes: EOM are normal. Pupils are equal, round, and reactive to light. Right eye exhibits no discharge. Left eye exhibits no discharge. No scleral icterus.  Pale conjunctiva  Neck: Normal range of motion. Neck supple.  Cardiovascular: Normal rate, regular rhythm, normal heart sounds and intact distal pulses.   No murmur heard. Pulmonary/Chest: Effort normal and breath sounds normal. No respiratory distress. He has no wheezes. He has no rales.  Abdominal: Soft. Bowel sounds are normal. He exhibits no distension and no mass. There is tenderness (mild in LLQ). There is no rebound and no guarding.  Genitourinary: Rectal exam shows internal hemorrhoid. Rectal exam shows no mass, no tenderness and anal tone normal. Guaiac positive stool.  Musculoskeletal: He exhibits no edema and no tenderness.  Left lower leg with cast in place   Lymphadenopathy:    He has no cervical adenopathy.  Neurological: He is alert and oriented to person, place, and time. He exhibits normal muscle tone.  Skin: Skin is warm and dry. No rash noted. He is not diaphoretic. No erythema.    ED Course  Procedures (including critical care time)  Labs Reviewed  CBC  RENAL FUNCTION PANEL   No results found.   No diagnosis found.    MDM  62 yo M with ESRD on T/T/S dialysis, CHF, CVA, DM, HTN and recent admission for rectal bleeding from hemorrhoids presenting with recurrent rectal bleeding associated with mild LLQ tenderness some emesis and shortness of breath.  Will check CBC to assess blood loss.  Hemodynamics are stable.  Will  check abdominal film as well given emesis and LLQ pain.  No evidence of diverticulosis or obstruction on CT or colonoscopy last week.  1030: Patient less responsive, will follow commands, but will not respond verbally.  BP in the 100s systolic, repeat 811.  Diaper with bright red blood and clots (clean one as of 0930).  500cc NS bolus x1.  Will need to be cautious with fluid resuscitation given ESRD on dialysis.  Transfuse 2u pRBCs.  Will call hospitalist for admission.  Will call general surgery for further management of internal hemorrhoids.  1100: discussed with [facility] who will admit to step down.  1130: Surgery to see patient.  Renal aware of his admission and need for dialysis.  Will give blood during dialysis.  Phebe Colla, MD 11/01/12 [phone]

## 2012-11-01 NOTE — ED Notes (Signed)
[facility] at bedside. Vital signs stable.

## 2012-11-01 NOTE — Consult Note (Signed)
[facility] Renal Consultation Note  Indication for Consultation:  Management of ESRD/hemodialysis; anemia, hypertension/volume and secondary hyperparathyroidism  HPI: Oscar Castillo is a 62 y.o. male with ESRD on dialysis on TTS at the [facility] who was hospitalized 4/3-4/6 for acute blood loss, believed to be secondary to hemorrhoids per colonoscopy, but after starting Miralax yesterday, had his first bowel movement early this morning, initially with bright red blood and followed with significant rectal bleeding with clots.  His hemoglobin has fallen from 8.8 at discharge on 4/6 to 7.7 on arrival to the ED today.  During his previous admission colonoscopy per Dr. Vida Rigger on 4/4 showed only internal and external hemorrhoids to be the likely source of bleeding, but evaluation by Dr. Emelia Loron of Surgery with an anoscope today revealed only mild internal hemorrhoids with dark blood and clots proximally.  The patient is being admitted for further evaluation.  Currently he feels lethargic with mild shortness of breath, but denies any pain.  He had surgical repair of left tibia and distal fibula fractures, secondary to a fall, per Dr. Teryl Lucy on 2/14, followed by inpatient rehab and had follow-up yesterday after finishing an antibiotic for a left lower leg wound.   Dialysis Orders: Center: [place] on TTS. EDW 94.5 kg  HD Bath 2K/2Ca  Time 4 hrs  Heparin 5000 U. Access Right IJ catheter, also AVF @ LUA  BFR 400 DFR 800  Hectorol 6 mcg IV/HD  Epogen 10,000 Units IV/HD  Venofer 100 mg until 4/22.  Past Medical History  Diagnosis Date  . ALLERGIC RHINITIS 05/05/2007  . BACK PAIN, LUMBAR, CHRONIC 08/06/2009  . BENIGN PROSTATIC HYPERTROPHY 08/01/2010  . CEREBROVASCULAR ACCIDENT, HX OF 08/06/2009  . CHOLELITHIASIS 08/01/2010  . CONGESTIVE HEART FAILURE 03/18/2009  . DEPRESSION 03/18/2009  . DIABETES MELLITUS, TYPE II 03/25/2007  . ERECTILE DYSFUNCTION 03/25/2007  . GERD  03/25/2007  . HEPATITIS C, HX OF 03/25/2007  . HYPERTENSION 03/25/2007  . Morbid obesity 03/25/2007  . NEPHROLITHIASIS, HX OF 03/18/2009  . ESRD on hemodialysis 09/08/2012    ESRD due to DM/HTN. Started dialysis in November 2013. Has LUA AVF placed Jun 14 2012 which is maturing as of Feb 2014.   [name] Renal insufficiency    Past Surgical History  Procedure Laterality Date  . Nephrectomy      partial RR  . Av fistula placement  06/14/2012    Procedure: ARTERIOVENOUS (AV) FISTULA CREATION;  Surgeon: Chuck Hint, MD;  Location: [facility] OR;  Service: Vascular;  Laterality: Left;  Left basilic vein transposition with fistula.  . Tibia im nail insertion Left 09/09/2012    Procedure: INTRAMEDULLARY (IM) NAIL TIBIAL;  Surgeon: Eulas Post, MD;  Location: [facility] OR;  Service: Orthopedics;  Laterality: Left;  left tibial nail and open reduction internal fixation left fibula fracture  . Orif fibula fracture Left 09/09/2012    Procedure: OPEN REDUCTION INTERNAL FIXATION (ORIF) FIBULA FRACTURE;  Surgeon: Eulas Post, MD;  Location: [facility] OR;  Service: Orthopedics;  Laterality: Left;  . Colonoscopy N/A 10/28/2012    Procedure: COLONOSCOPY;  Surgeon: Petra Kuba, MD;  Location: [facility] ENDOSCOPY;  Service: Endoscopy;  Laterality: N/A;   Family History  Problem Relation Age of Onset  . Coronary artery disease Other   . Diabetes Other    Social History  He denies any history of tobacco, alcohol, or illicit drug use. He previously worked for [employer] and lives with his wife.  No Known Allergies Prior to Admission medications   Medication Sig Start Date End Date Taking? Authorizing Provider  acetaminophen (TYLENOL) 325 MG tablet Take 650 mg by mouth every 6 (six) hours as needed for pain.   Yes Historical Provider, MD  Brinzolamide-Brimonidine [facility]) 1-0.2 % SUSP Place 1 drop into the right eye 2 (two) times daily.   Yes Historical Provider, MD  calcium acetate (PHOSLO) 667 MG capsule Take 2  capsules (1,334 mg total) by mouth 3 (three) times daily with meals. 06/21/12  Yes Sorin Luanne Bras, MD  insulin glargine (LANTUS SOLOSTAR) 100 UNIT/ML injection Inject 15 Units into the skin daily. 10/03/12  Yes Corwin Levins, MD  labetalol (NORMODYNE) 300 MG tablet Take 150 mg by mouth 2 (two) times daily.   Yes Historical Provider, MD  pantoprazole (PROTONIX) 40 MG tablet Take 1 tablet (40 mg total) by mouth daily. 09/12/12  Yes Sorin Luanne Bras, MD  polyethylene glycol (MIRALAX / GLYCOLAX) packet Take 17 g by mouth daily. 10/30/12  Yes Belkys A Regalado, MD  traMADol (ULTRAM) 50 MG tablet Take 50 mg by mouth every 8 (eight) hours as needed for pain.   Yes Historical Provider, MD  Travoprost, BAK Free, (TRAVATAN) 0.004 % SOLN ophthalmic solution Place 1 drop into both eyes at bedtime. 10/30/12  Yes Belkys A Regalado, MD   Labs:  Results for orders placed during the hospital encounter of 11/01/12 (from the past 48 hour(s))  CBC     Status: Abnormal   Collection Time    11/01/12  9:30 AM      Result Value Range   WBC 8.1  4.0 - 10.5 K/uL   RBC 2.76 (*) 4.22 - 5.81 MIL/uL   Hemoglobin 7.7 (*) 13.0 - 17.0 g/dL   HCT 52.8 (*) 41.3 - 24.4 %   MCV 84.4  78.0 - 100.0 fL   MCH 27.9  26.0 - 34.0 pg   MCHC 33.0  30.0 - 36.0 g/dL   RDW 01.0 (*) 27.2 - 53.6 %   Platelets 337  150 - 400 K/uL  RENAL FUNCTION PANEL     Status: Abnormal   Collection Time    11/01/12  9:30 AM      Result Value Range   Sodium 136  135 - 145 mEq/L   Potassium 4.7  3.5 - 5.1 mEq/L   Chloride 100  96 - 112 mEq/L   CO2 23  19 - 32 mEq/L   Glucose, Bld 184 (*) 70 - 99 mg/dL   BUN 52 (*) 6 - 23 mg/dL   Creatinine, Ser 64.40 (*) 0.50 - 1.35 mg/dL   Calcium 9.1  8.4 - 34.7 mg/dL   Phosphorus 4.6  2.3 - 4.6 mg/dL   Albumin 2.6 (*) 3.5 - 5.2 g/dL   GFR calc non Af Amer 4 (*) >90 mL/min   GFR calc Af Amer 5 (*) >90 mL/min   Comment:            The eGFR has been calculated     using the CKD EPI equation.     This calculation has not  been     validated in all clinical     situations.     eGFR's persistently     <90 mL/min signify     possible Chronic Kidney Disease.  PREPARE RBC (CROSSMATCH)     Status: None   Collection Time    11/01/12 10:41 AM      Result Value Range  Order Confirmation ORDER PROCESSED BY BLOOD BANK    TYPE AND SCREEN     Status: None   Collection Time    11/01/12 10:41 AM      Result Value Range   ABO/RH(D) O POS     Antibody Screen NEG     Sample Expiration 11/04/2012     Unit Number [id number]     Blood Component Type RED CELLS,LR     Unit division 00     Status of Unit ALLOCATED     Transfusion Status OK TO TRANSFUSE     Crossmatch Result Compatible     Unit Number [id number]     Blood Component Type RBC LR PHER1     Unit division 00     Status of Unit ALLOCATED     Transfusion Status OK TO TRANSFUSE     Crossmatch Result Compatible    PROTIME-INR     Status: None   Collection Time    11/01/12 11:28 AM      Result Value Range   Prothrombin Time 14.6  11.6 - 15.2 seconds   INR 1.16  0.00 - 1.49   Constitutional: positive for fatigue; negative for chills, fevers and sweats Ears, nose, mouth, throat, and face: negative for earaches, hoarseness, nasal congestion and sore throat Respiratory: positive for mild dyspnea, negative for cough, hemoptysis and sputum Cardiovascular: positive for dyspnea, negative for chest pain, chest pressure/discomfort, orthopnea and palpitations Gastrointestinal: positive for blood in stool, negative for abdominal pain, diarrhea, nausea and vomiting Genitourinary:negative, oliguric Musculoskeletal:positive for left lower leg fracture, negative for arthralgias, back pain, myalgias and neck pain Neurological: negative for dizziness, headaches and paresthesia  Physical Exam: Filed Vitals:   11/01/12 1140  BP: 144/81  Pulse: 71  Temp:   Resp: 18     General appearance: lethargic, but cooperative, in no distress Head: Normocephalic, without  obvious abnormality, atraumatic Neck: no adenopathy, no carotid bruit, no JVD and supple, symmetrical, trachea midline Resp: clear to auscultation bilaterally Cardio: regular rate and rhythm, S1, S2 normal, no murmur, click, rub or gallop GI: soft, non-tender; bowel sounds normal; no masses,  no organomegaly Extremities: no edema on right, cast on left lower leg Neurologic: Grossly normal Dialysis Access: Right IJ catheter, AVF @ LUA   Assessment/Plan: 1. Rectal bleeding - believed to be secondary to hemorrhoids (per colonoscopy 4/4), but further evaluation pending. 2. ESRD -  HD on TTS @ [place]; K 4.7; has R IJ catheter and AVF, which he refuses to use at [facility].  HD pending today, agreed to use AVF today. 3. Hypertension/volume  - BP 144/81 on Labetalol 300 mg bid.  Weigh prior to HD to determine UF goal. 4. Anemia  - Hgb 7.7, down from 8.8 on 4/6 secondary to GI bleeding; on outpatient Epogen 10,000 U.  Transfuse 1 unit of PRBCs, Aranesp 200 mcg today. 5. Metabolic bone disease - Ca 9.1 (10.2 corrected), P 4.6; Hectorol 6 mcg, Phoslo 2 with meals. 6. Nutrition - Alb 2.6, renal diet. 7. Hx left tibial fx - secondary to fall, s/p ORIF 2/14 by Dr. Dion Saucier. 8. Hx Hepatitis C 9. Hx CVA - in 07/2009. 10. Hx nephrolithiasis  LYLES,CHARLES 11/01/2012, 1:10 PM   Attending Nephrologist: Delano Metz, MD  Patient seen and examined.  Agree with assessment and plan as above. Vinson Moselle  MD [phone] pgr    [phone] cell 11/01/2012, 5:09 PM

## 2012-11-01 NOTE — Consult Note (Signed)
Referring Provider: Dr. Raymond Gurney Primary Care Physician:  Oliver Barre, MD Primary Gastroenterologist:  Gentry Fitz  Reason for Consultation:  Rectal bleeding  HPI: Oscar Castillo is a 62 y.o. male seen by our service last week because of rectal bleeding, at which time colonoscopy to the cecum was normal except for hemorrhoids. He went home and did okay for a day or 2 but then began having bleeding again today, in association with bowel movements. It sounds as though he has had a couple of episodes. He came to the emergency room where he was hemodynamically stable but his hemoglobin had dropped about 1 g over the past several days. He is being transfused on dialysis this evening.  In the meantime, based on the above information, I recommended surgical consultation for presumed briskly bleeding internal hemorrhoids, but anoscopy by Dr. Dwain Sarna in the emergency room showed what appeared to be blood, fairly large amount, coming from above.  The patient has no prior history of ulcer disease but does take an aspirin daily. His BUN is uninterpretable since he is a dialysis patient. For what it is worth, however, he does not report any upper tract symptoms recently.   Past Medical History  Diagnosis Date  . ALLERGIC RHINITIS 05/05/2007  . BACK PAIN, LUMBAR, CHRONIC 08/06/2009  . BENIGN PROSTATIC HYPERTROPHY 08/01/2010  . CEREBROVASCULAR ACCIDENT, HX OF 08/06/2009  . CHOLELITHIASIS 08/01/2010  . CONGESTIVE HEART FAILURE 03/18/2009  . DEPRESSION 03/18/2009  . DIABETES MELLITUS, TYPE II 03/25/2007  . ERECTILE DYSFUNCTION 03/25/2007  . GERD 03/25/2007  . HEPATITIS C, HX OF 03/25/2007  . HYPERTENSION 03/25/2007  . Morbid obesity 03/25/2007  . NEPHROLITHIASIS, HX OF 03/18/2009  . ESRD on hemodialysis 09/08/2012    ESRD due to DM/HTN. Started dialysis in November 2013. Has LUA AVF placed Jun 14 2012 which is maturing as of Feb 2014.   [name] Renal insufficiency     Past Surgical History  Procedure Laterality Date  .  Nephrectomy      partial RR  . Av fistula placement  06/14/2012    Procedure: ARTERIOVENOUS (AV) FISTULA CREATION;  Surgeon: Chuck Hint, MD;  Location: [facility] OR;  Service: Vascular;  Laterality: Left;  Left basilic vein transposition with fistula.  . Tibia im nail insertion Left 09/09/2012    Procedure: INTRAMEDULLARY (IM) NAIL TIBIAL;  Surgeon: Eulas Post, MD;  Location: [facility] OR;  Service: Orthopedics;  Laterality: Left;  left tibial nail and open reduction internal fixation left fibula fracture  . Orif fibula fracture Left 09/09/2012    Procedure: OPEN REDUCTION INTERNAL FIXATION (ORIF) FIBULA FRACTURE;  Surgeon: Eulas Post, MD;  Location: [facility] OR;  Service: Orthopedics;  Laterality: Left;  . Colonoscopy N/A 10/28/2012    Procedure: COLONOSCOPY;  Surgeon: Petra Kuba, MD;  Location: [facility] ENDOSCOPY;  Service: Endoscopy;  Laterality: N/A;    Prior to Admission medications   Medication Sig Start Date End Date Taking? Authorizing Provider  acetaminophen (TYLENOL) 325 MG tablet Take 650 mg by mouth every 6 (six) hours as needed for pain.   Yes Historical Provider, MD  Brinzolamide-Brimonidine [facility]) 1-0.2 % SUSP Place 1 drop into the right eye 2 (two) times daily.   Yes Historical Provider, MD  calcium acetate (PHOSLO) 667 MG capsule Take 2 capsules (1,334 mg total) by mouth 3 (three) times daily with meals. 06/21/12  Yes Sorin Luanne Bras, MD  insulin glargine (LANTUS SOLOSTAR) 100 UNIT/ML injection Inject 15 Units into the skin daily. 10/03/12  Yes  Corwin Levins, MD  labetalol (NORMODYNE) 300 MG tablet Take 150 mg by mouth 2 (two) times daily.   Yes Historical Provider, MD  pantoprazole (PROTONIX) 40 MG tablet Take 1 tablet (40 mg total) by mouth daily. 09/12/12  Yes Sorin Luanne Bras, MD  polyethylene glycol (MIRALAX / GLYCOLAX) packet Take 17 g by mouth daily. 10/30/12  Yes Belkys A Regalado, MD  traMADol (ULTRAM) 50 MG tablet Take 50 mg by mouth every 8 (eight) hours as needed for pain.   Yes  Historical Provider, MD  Travoprost, BAK Free, (TRAVATAN) 0.004 % SOLN ophthalmic solution Place 1 drop into both eyes at bedtime. 10/30/12  Yes Belkys A Regalado, MD    Current Facility-Administered Medications  Medication Dose Route Frequency Provider Last Rate Last Dose  . 0.9 %  sodium chloride infusion  250 mL Intravenous PRN Penny Pia, MD      . 0.9 %  sodium chloride infusion  100 mL Intravenous PRN Gerome Apley, PA-C      . 0.9 %  sodium chloride infusion  100 mL Intravenous PRN Gerome Apley, PA-C      . acetaminophen (TYLENOL) tablet 650 mg  650 mg Oral Q6H PRN Penny Pia, MD      . alteplase (CATHFLO ACTIVASE) injection 2 mg  2 mg Intracatheter Once PRN Gerome Apley, PA-C      . brimonidine (ALPHAGAN) 0.2 % ophthalmic solution 1 drop  1 drop Right Eye BID Penny Pia, MD      . brinzolamide (AZOPT) 1 % ophthalmic suspension 1 drop  1 drop Right Eye BID Penny Pia, MD      . calcium acetate (PHOSLO) capsule 1,334 mg  1,334 mg Oral TID WC Gerome Apley, PA-C      . Melene Muller ON 11/08/2012] darbepoetin (ARANESP) injection 200 mcg  200 mcg Intravenous Q Tue-HD Gerome Apley, PA-C      . feeding supplement (NEPRO CARB STEADY) liquid 237 mL  237 mL Oral PRN Gerome Apley, PA-C      . Melene Muller ON 11/03/2012] ferric gluconate (NULECIT) 125 mg in sodium chloride 0.9 % 100 mL IVPB  125 mg Intravenous Q T,Th,Sa-HD Gerome Apley, PA-C      . heparin injection 1,000 Units  1,000 Units Dialysis PRN Gerome Apley, PA-C      . insulin aspart (novoLOG) injection 0-9 Units  0-9 Units Subcutaneous TID WC Penny Pia, MD      . insulin glargine (LANTUS) injection 15 Units  15 Units Subcutaneous Daily Penny Pia, MD      . lidocaine (PF) (XYLOCAINE) 1 % injection 5 mL  5 mL Intradermal PRN Gerome Apley, PA-C      . lidocaine-prilocaine (EMLA) cream 1 application  1 application Topical PRN Gerome Apley, PA-C      . pantoprazole (PROTONIX) EC tablet 40 mg  40 mg Oral Daily Penny Pia, MD      .  pentafluoroprop-tetrafluoroeth (GEBAUERS) aerosol 1 application  1 application Topical PRN Gerome Apley, PA-C      . polyethylene glycol (MIRALAX / GLYCOLAX) packet 17 g  17 g Oral Daily Penny Pia, MD      . sodium chloride 0.9 % injection 3 mL  3 mL Intravenous Q12H Penny Pia, MD      . sodium chloride 0.9 % injection 3 mL  3 mL Intravenous Q12H Penny Pia, MD      . sodium chloride 0.9 % injection 3 mL  3 mL Intravenous PRN Penny Pia, MD      .  Travoprost (BAK Free) (TRAVATAN) 0.004 % ophthalmic solution SOLN 1 drop  1 drop Both Eyes QHS Penny Pia, MD        Allergies as of 11/01/2012  . (No Known Allergies)    Family History  Problem Relation Age of Onset  . Coronary artery disease Other   . Diabetes Other     History   Social History  . Marital Status: Married    Spouse Name: N/A    Number of Children: N/A  . Years of Education: 16   Occupational History  . disabled due to stroke    Social History Main Topics  . Smoking status: Never Smoker   . Smokeless tobacco: Not on file  . Alcohol Use: No  . Drug Use: No  . Sexually Active: Not on file   Other Topics Concern  . 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   No local family, lives alone    Review of Systems: See history of present illness  Physical Exam: Vital signs in last 24 hours: Temp:  [98 F (36.7 C)-99.4 F (37.4 C)] 98.8 F (37.1 C) (04/08 1900) Pulse Rate:  [71-83] 77 (04/08 1900) Resp:  [12-20] 20 (04/08 1900) BP: (121-151)/(64-90) 148/85 mmHg (04/08 1900) SpO2:  [100 %] 100 % (04/08 1700) Weight:  [94.1 kg (207 lb 7.3 oz)-97.9 kg (215 lb 13.3 oz)] 97.9 kg (215 lb 13.3 oz) (04/08 1700) Last BM Date: 11/01/12 General:   Alert,  Well-developed, well-nourished, pleasant and cooperative in NAD on dialysis at this time Head:  Normocephalic and atraumatic. Lungs:  Clear throughout to auscultation.   No wheezes, crackles, or rhonchi.  No evident respiratory distress. Heart:   Regular rate and rhythm; no murmurs, clicks, rubs,  or gallops. Abdomen:  Soft, nontender, nontympanitic, and nondistended. No masses, hepatosplenomegaly or ventral hernias noted. Neurologic:  Alert and coherent Psych:   Alert and cooperative. Normal mood and affect.  Intake/Output from previous day:   Intake/Output this shift:    Lab Results:  Recent Labs  11/01/12 0930 11/01/12 1444 11/01/12 1700  WBC 8.1 9.4 7.9  HGB 7.7* 7.3* 6.9*  HCT 23.3* 22.0* 20.6*  PLT 337 336 332   BMET  Recent Labs  11/01/12 0930  NA 136  K 4.7  CL 100  CO2 23  GLUCOSE 184*  BUN 52*  CREATININE 10.73*  CALCIUM 9.1   LFT  Recent Labs  11/01/12 0930  ALBUMIN 2.6*   PT/INR  Recent Labs  11/01/12 1128  LABPROT 14.6  INR 1.16    Studies/Results: Dg Abd 1 View  11/01/2012  *RADIOLOGY REPORT*  Clinical Data: Left lower quadrant abdominal pain.  Nausea and vomiting.  Rectal bleeding.  ABDOMEN - 1 VIEW  Comparison: CT abdomen and pelvis 10/27/2012.  Findings: Bowel gas pattern unremarkable without evidence of obstruction or significant ileus.  Gas and moderate stool burden throughout normal caliber colon.  Phleboliths in the pelvis.  No visible right urinary tract calculi.  Mild degenerative changes involving the lumbar spine.  IMPRESSION: No acute abdominal abnormality.  I note that the recent CT demonstrated a rectal mass which likely explains the rectal bleeding.   Original Report Authenticated By: Hulan Saas, M.D.     Impression: This could be upper tract bleeding, although his clinical stability and the absence of a more significant drop in his hemoglobin would suggest a lower tract origin.  Plan: Upper endoscopy tomorrow morning,  nature purpose and risks reviewed with the patient and he is agreeable. If the endoscopy is unrevealing, I will plan to do an unprepped sigmoidoscopy or limited colonoscopy to see if blood is coming from  above the anal verge.     LOS: 0 days   Dencil Cayson V  11/01/2012, 7:27 PM

## 2012-11-01 NOTE — Care Management Note (Unsigned)
    Page 1 of 1   11/01/2012     3:02:31 PM   CARE MANAGEMENT NOTE 11/01/2012  Patient:  Oscar Castillo, Oscar Castillo   Account Number:  [id number]  Date Initiated:  11/01/2012  Documentation initiated by:  GRAVES-BIGELOW,Quinci Gavidia  Subjective/Objective Assessment:   Pt admitted with GI bleed. S/p 2 units of PRBC. Pt was recently d/c on 10-30-12.     Action/Plan:   CM will continue to monitor for disposition needs.   Anticipated DC Date:  11/03/2012   Anticipated DC Plan:  HOME W HOME HEALTH SERVICES      DC Planning Services  CM consult      Choice offered to / List presented to:             Status of service:  In process, will continue to follow Medicare Important Message given?   (If response is "NO", the following Medicare IM given date fields will be blank) Date Medicare IM given:   Date Additional Medicare IM given:    Discharge Disposition:    Per UR Regulation:  Reviewed for med. necessity/level of care/duration of stay  If discussed at Long Length of Stay Meetings, dates discussed:    Comments:

## 2012-11-01 NOTE — ED Provider Notes (Signed)
I saw and evaluated the patient, reviewed the resident's note and I agree with the findings and plan. This patient presents several days of recent admission for GI bleed.  Notably, the patient has bloody stool today.  He appears in a similar condition to prior physical exam notes, the patient's wife states that he is more listless.  Vital signs are stable.  We reviewed the patient's prior imaging studies, including colonoscopy from several days ago, not demonstrating any identifiable: Lesion, but demonstrating internal and external hemorrhoids.  We discussed his case with surgery, and the patient was admitted for further evaluation and management (to the hospitalist team).  Gerhard Munch, MD 11/01/12 [phone]

## 2012-11-01 NOTE — ED Notes (Addendum)
Pt presents to department for evaluation of rectal bleeding. Ongoing for several days, pt was admitted recently for same. States dark colored bloody stools this morning. Also states abdominal pain and N/V. States he received x2 blood transfusions while being hospitalized. Pt is conscious alert and oriented x4. HD patient, treatments on Tues, Thur, and Sat. Fistula to L upper arm.

## 2012-11-01 NOTE — Progress Notes (Signed)
UR Completed Lauro Manlove Graves-Bigelow, RN,BSN [phone]  

## 2012-11-02 ENCOUNTER — Encounter (HOSPITAL_COMMUNITY)
Admission: EM | Disposition: A | Discharge status: Home / Self Care | Payer: Self-pay | Source: Home / Self Care | Attending: Internal Medicine

## 2012-11-02 ENCOUNTER — Encounter (HOSPITAL_COMMUNITY): Payer: Self-pay | Admitting: *Deleted

## 2012-11-02 DIAGNOSIS — I5041 Acute combined systolic (congestive) and diastolic (congestive) heart failure: Secondary | ICD-10-CM

## 2012-11-02 DIAGNOSIS — D62 Acute posthemorrhagic anemia: Secondary | ICD-10-CM

## 2012-11-02 DIAGNOSIS — N186 End stage renal disease: Secondary | ICD-10-CM

## 2012-11-02 DIAGNOSIS — K922 Gastrointestinal hemorrhage, unspecified: Principal | ICD-10-CM

## 2012-11-02 HISTORY — PX: ESOPHAGOGASTRODUODENOSCOPY: SHX5428

## 2012-11-02 HISTORY — PX: COLONOSCOPY: SHX5424

## 2012-11-02 LAB — BASIC METABOLIC PANEL
CO2: 28 mEq/L (ref 19–32)
Calcium: 8.2 mg/dL — ABNORMAL LOW (ref 8.4–10.5)
Glucose, Bld: 137 mg/dL — ABNORMAL HIGH (ref 70–99)
Sodium: 137 mEq/L (ref 135–145)

## 2012-11-02 LAB — GLUCOSE, CAPILLARY: Glucose-Capillary: 180 mg/dL — ABNORMAL HIGH (ref 70–99)

## 2012-11-02 LAB — CBC
HCT: 24.3 % — ABNORMAL LOW (ref 39.0–52.0)
Hemoglobin: 8.6 g/dL — ABNORMAL LOW (ref 13.0–17.0)
Hemoglobin: 8.1 g/dL — ABNORMAL LOW (ref 13.0–17.0)
MCH: 28.1 pg (ref 26.0–34.0)
MCV: 83 fL (ref 78.0–100.0)
Platelets: 224 10*3/uL (ref 150–400)
RBC: 3.06 MIL/uL — ABNORMAL LOW (ref 4.22–5.81)
RBC: 2.91 MIL/uL — ABNORMAL LOW (ref 4.22–5.81)
WBC: 7.6 10*3/uL (ref 4.0–10.5)

## 2012-11-02 SURGERY — EGD (ESOPHAGOGASTRODUODENOSCOPY)
Anesthesia: Moderate Sedation

## 2012-11-02 MED ORDER — PEG 3350-KCL-NA BICARB-NACL 420 G PO SOLR
4000.0000 mL | Freq: Once | ORAL | Status: AC
  Administered 2012-11-02: 4000 mL via ORAL
  Filled 2012-11-02: qty 4000

## 2012-11-02 MED ORDER — ONDANSETRON HCL 4 MG/2ML IJ SOLN
4.0000 mg | Freq: Four times a day (QID) | INTRAMUSCULAR | Status: DC | PRN
  Administered 2012-11-02: 4 mg via INTRAVENOUS
  Filled 2012-11-02 (×2): qty 2

## 2012-11-02 MED ORDER — MIDAZOLAM HCL 5 MG/5ML IJ SOLN
INTRAMUSCULAR | Status: DC | PRN
  Administered 2012-11-02 (×2): 2 mg via INTRAVENOUS

## 2012-11-02 MED ORDER — BUTAMBEN-TETRACAINE-BENZOCAINE 2-2-14 % EX AERO
INHALATION_SPRAY | CUTANEOUS | Status: DC | PRN
  Administered 2012-11-02: 2 via TOPICAL

## 2012-11-02 MED ORDER — SODIUM CHLORIDE 0.9 % IV SOLN: INTRAVENOUS | Status: DC

## 2012-11-02 MED ORDER — FENTANYL CITRATE 0.05 MG/ML IJ SOLN
INTRAMUSCULAR | Status: AC
  Filled 2012-11-02: qty 4

## 2012-11-02 MED ORDER — SODIUM CHLORIDE 0.9 % IV SOLN
INTRAVENOUS | Status: DC
  Administered 2012-11-03: 250 mL via INTRAVENOUS

## 2012-11-02 MED ORDER — MIDAZOLAM HCL 5 MG/ML IJ SOLN
INTRAMUSCULAR | Status: AC
  Filled 2012-11-02: qty 2

## 2012-11-02 MED ORDER — FENTANYL CITRATE 0.05 MG/ML IJ SOLN
INTRAMUSCULAR | Status: DC | PRN
  Administered 2012-11-02 (×2): 25 ug via INTRAVENOUS

## 2012-11-02 NOTE — Progress Notes (Signed)
Patient ID: Oscar Castillo, male   DOB: 05-22-1951, 62 y.o.   MRN: [id number] Day of Surgery  Subjective: Pt feels better today.  No pain.  No further bloody stools.  Objective: Vital signs in last 24 hours: Temp:  [97.9 F (36.6 C)-98.8 F (37.1 C)] 98.7 F (37.1 C) (04/09 1125) Pulse Rate:  [74-85] 79 (04/09 1125) Resp:  [9-49] 16 (04/09 1125) BP: (95-161)/(54-90) 132/84 mmHg (04/09 1125) SpO2:  [100 %] 100 % (04/09 1125) Weight:  [210 lb 1.6 oz (95.3 kg)-215 lb 13.3 oz (97.9 kg)] 210 lb 8.6 oz (95.5 kg) (04/09 0432) Last BM Date: 11/01/12  Intake/Output from previous day: 04/08 0701 - 04/09 0700 In: 1370 [P.O.:240; I.V.:500; Blood:630] Out: 2900  Intake/Output this shift:    PE: Abd: soft, NT, ND  Lab Results:   Recent Labs  11/01/12 2218 11/02/12 0645  WBC 7.9 7.4  HGB 9.3* 8.6*  HCT 27.4* 25.4*  PLT 229 224   BMET  Recent Labs  11/01/12 0930 11/02/12 0645  NA 136 137  K 4.7 4.0  CL 100 100  CO2 23 28  GLUCOSE 184* 137*  BUN 52* 31*  CREATININE 10.73* 7.15*  CALCIUM 9.1 8.2*   PT/INR  Recent Labs  11/01/12 1128  LABPROT 14.6  INR 1.16   CMP     Component Value Date/Time   NA 137 11/02/2012 0645   K 4.0 11/02/2012 0645   CL 100 11/02/2012 0645   CO2 28 11/02/2012 0645   GLUCOSE 137* 11/02/2012 0645   BUN 31* 11/02/2012 0645   CREATININE 7.15* 11/02/2012 0645   CALCIUM 8.2* 11/02/2012 0645   PROT 7.4 10/27/2012 1236   ALBUMIN 2.6* 11/01/2012 0930   AST 14 10/27/2012 1236   ALT 9 10/27/2012 1236   ALKPHOS 72 10/27/2012 1236   BILITOT 0.2* 10/27/2012 1236   GFRNONAA 7* 11/02/2012 0645   GFRAA 9* 11/02/2012 0645   Lipase     Component Value Date/Time   LIPASE 17.0 11/06/2009 0948       Studies/Results: Dg Abd 1 View  11/01/2012  *RADIOLOGY REPORT*  Clinical Data: Left lower quadrant abdominal pain.  Nausea and vomiting.  Rectal bleeding.  ABDOMEN - 1 VIEW  Comparison: CT abdomen and pelvis 10/27/2012.  Findings: Bowel gas pattern unremarkable without evidence of  obstruction or significant ileus.  Gas and moderate stool burden throughout normal caliber colon.  Phleboliths in the pelvis.  No visible right urinary tract calculi.  Mild degenerative changes involving the lumbar spine.  IMPRESSION: No acute abdominal abnormality.  I note that the recent CT demonstrated a rectal mass which likely explains the rectal bleeding.   Original Report Authenticated By: Hulan Saas, M.D.     Anti-infectives: Anti-infectives   None       Assessment/Plan  1. GI bleed, suspect lower in nature  Plan: 1. Flex sig showed bloody stool today coming from higher than the descending colon.  GI's note said they would do a full prep and repeat colonoscopy.  Will follow for not, but no acute surgical indications at this time.  Hemorrhoids are not the source of bleeding.   LOS: 1 day    Maikel Neisler E 11/02/2012, 2:32 PM Pager: [phone]

## 2012-11-02 NOTE — Progress Notes (Signed)
Nutrition Brief Note  Patient identified on the Malnutrition Screening Tool (MST) Report for recent weight loss without trying (patient unsure).  Per records below, patient's weight fluctuates likely due to fluid loss given medical hx.  Wt Readings from Last 10 Encounters:  11/02/12 210 lb 8.6 oz (95.5 kg)  11/02/12 210 lb 8.6 oz (95.5 kg)  10/29/12 204 lb 5.9 oz (92.701 kg)  10/29/12 204 lb 5.9 oz (92.701 kg)  10/03/12 187 lb (84.823 kg)  09/23/12 207 lb 10.8 oz (94.2 kg)  09/12/12 223 lb 5.2 oz (101.3 kg)  09/12/12 223 lb 5.2 oz (101.3 kg)  06/21/12 219 lb 12.8 oz (99.7 kg)  06/21/12 219 lb 12.8 oz (99.7 kg)    Body mass index is 31.08 kg/(m^2). Patient meets criteria for Obesity Class I based on current BMI.   Current diet order is Renal 60/70-08-28-1198 ml/Carbohydrate Modified Medium Calorie, patient is consuming approximately 100% of meals at this time. Labs and medications reviewed.   No nutrition interventions warranted at this time. If nutrition issues arise, please consult RD.   Maureen Chatters, RD, LDN Pager #: [phone] After-Hours Pager #: [phone]

## 2012-11-02 NOTE — Progress Notes (Signed)
Reviewed today's procedure findings w/ pt, and have now ordered prep for tomorrow's colonoscopy.  Florencia Reasons, M.D. [phone]

## 2012-11-02 NOTE — Progress Notes (Addendum)
[facility] Progress Note [place] TEAM 1 - Stepdown/ICU TEAM   Oscar Castillo ZOX:[id number] DOB: 1950/10/11 DOA: 11/01/2012 PCP: Oliver Barre, MD  Brief narrative: 62 y.o. male w/ h/o ESRD on HD Tues, Thurs, Saturday who was recently admitted to [facility] due to GI bleed which was worked up with colonoscopy which was negative except for hemorrhoids. Patient discharged on 10/30/12 and presented again on 11/01/12 with the same complaint, which occurred after patient had a large BM according to family member.  In the ED patient was found to have a hgb of 7.7 compared to prior to d/c which was 8.8. In the ED general surgery was consulted/   Assessment/Plan:  Acute blood loss anemia on Anemia due to chronic illness Hemoglobin appears to be slowly drifting back down - continue to follow trend - transfuse as needed to keep hemoglobin 7.0 or greater - pt currently s/p 2U PRBC this admit  GI bleed GI is following - nondiagnostic EGD and unprepped colonoscopy 11/02/2012 - plan is for Foley prep colonoscopy 11/03/2012  HYPERTENSION w/ severe concentric LVH Blood pressure is currently well-controlled  GERD  DM type I controlled with renal manifestation CBGs are currently well-controlled  CKD stage V requiring chronic dialysis Continue usual dialysis schedule per nephrology  Chronic combined systolic (EF 45%) and grade 2diastolic congestive heart failure Volume management per dialysis - hemodynamically compensated at the present time  L tibial nail and ORIF - Feb 2014 Remains in case - f/u w/ Dr. Dion Saucier post d/c   HEPATITIS C, HX OF   CEREBROVASCULAR ACCIDENT, HX OF   BENIGN PROSTATIC HYPERTROPHY  Code Status: FULL Family Communication: Spoke with patient alone Disposition Plan: stable for transfer to medical bed   Consultants: GI [facility] Nephrology  Procedures: 4/8 - bedside anoscopy - mild internal hemorrhoids - bleeding from above hemorrhoids 4/9 - EGD - No bleeding  or blood in the stomach - Minimal aspirin gastropathy characterized by tiny superficial ulceration in the antrum - No source of hematochezia evident  4/9 - Colo - No active bleeding - blood-tinged stool present - will plan to do a repeat, prepped colonoscopy 4/10   Antibiotics: none  DVT prophylaxis: SCDs (cast to L LE)  HPI/Subjective: The patient is resting comfortably in his hospital bed.  He denies fevers chills nausea vomiting or shortness of breath.  He denies any obvious blood loss this morning.  Objective: Blood pressure 116/69, pulse 76, temperature 98.8 F (37.1 C), temperature source Oral, resp. rate 25, height 5\' 9"  (1.753 m), weight 95.5 kg (210 lb 8.6 oz), SpO2 100.00%.  Intake/Output Summary (Last 24 hours) at 11/02/12 1103 Last data filed at 11/01/12 2108  Gross per 24 hour  Intake   1370 ml  Output   2900 ml  Net  -1530 ml   Exam: General: No acute respiratory distress Lungs: Clear to auscultation bilaterally without wheezes or crackles Cardiovascular: Regular rate and rhythm without gallop or rub  Abdomen: Nontender, nondistended, soft, bowel sounds positive, no rebound, no ascites, no appreciable mass Extremities: No significant cyanosis, clubbing, or edema R lower extremity - left lower extremity is in cast from just below knee to toes  Data Reviewed: Basic Metabolic Panel:  Recent Labs Lab 10/27/12 1236 10/27/12 1900 11/01/12 0930 11/02/12 0645  NA 136 134* 136 137  K 5.9* 5.9* 4.7 4.0  CL 98 98 100 100  CO2 25 21 23 28   GLUCOSE 210* 173* 184* 137*  BUN 52* 57*  52* 31*  CREATININE 12.64* 12.68* 10.73* 7.15*  CALCIUM 9.3 8.9 9.1 8.2*  PHOS  --  6.1* 4.6  --    Liver Function Tests:  Recent Labs Lab 10/27/12 1236 10/27/12 1900 11/01/12 0930  AST 14  --   --   ALT 9  --   --   ALKPHOS 72  --   --   BILITOT 0.2*  --   --   PROT 7.4  --   --   ALBUMIN 2.7* 2.6* 2.6*    Recent Labs Lab 10/28/12 0702  AMMONIA 17   CBC:  Recent  Labs Lab 11/01/12 0930 11/01/12 1444 11/01/12 1700 11/01/12 2218 11/02/12 0645  WBC 8.1 9.4 7.9 7.9 7.4  HGB 7.7* 7.3* 6.9* 9.3* 8.6*  HCT 23.3* 22.0* 20.6* 27.4* 25.4*  MCV 84.4 84.3 83.7 83.3 83.0  PLT 337 336 332 229 224   BNP (last 3 results)  Recent Labs  06/11/12 0916  PROBNP 6937.0*   CBG:  Recent Labs Lab 10/29/12 2022 10/30/12 0744 10/30/12 1131 11/01/12 1648 11/01/12 2159  GLUCAP 164* 140* 163* 176* 152*    Recent Results (from the past 240 hour(s))  MRSA PCR SCREENING     Status: None   Collection Time    10/28/12  1:29 AM      Result Value Range Status   MRSA by PCR NEGATIVE  NEGATIVE Final   Comment:            The GeneXpert MRSA Assay (FDA     approved for NASAL specimens     only), is one component of a     comprehensive MRSA colonization     surveillance program. It is not     intended to diagnose MRSA     infection nor to guide or     monitor treatment for     MRSA infections.     Studies:  Recent x-ray studies have been reviewed in detail by the Attending Physician  Scheduled Meds:  Scheduled Meds: . brimonidine  1 drop Right Eye BID  . brinzolamide  1 drop Right Eye BID  . calcium acetate  1,334 mg Oral TID WC  . [START ON 11/08/2012] darbepoetin (ARANESP) injection - DIALYSIS  200 mcg Intravenous Q Tue-HD  . [START ON 11/03/2012] ferric gluconate (FERRLECIT/NULECIT) IV  125 mg Intravenous Q T,Th,Sa-HD  . insulin aspart  0-9 Units Subcutaneous TID WC  . insulin glargine  15 Units Subcutaneous Daily  . pantoprazole  40 mg Oral Daily  . polyethylene glycol  17 g Oral Daily  . sodium chloride  3 mL Intravenous Q12H  . sodium chloride  3 mL Intravenous Q12H  . Travoprost (BAK Free)  1 drop Both Eyes QHS   Continuous Infusions: . sodium chloride      Time spent on care of this patient:   MCCLUNG,JEFFREY T  [facility] Office  [phone] Pager - Text Page per Loretha Stapler as per below:  On-Call/Text Page:       [email or web address]      password [email or web address]  If 7PM-7AM, please contact night-coverage [email or web address] Password [email or web address] 11/02/2012, 11:03 AM   LOS: 1 day

## 2012-11-02 NOTE — Progress Notes (Signed)
Pt to HD- VSS-

## 2012-11-02 NOTE — Op Note (Signed)
[name] [address] [place], [address]   ENDOSCOPY PROCEDURE REPORT  PATIENT: Oscar Castillo, Oscar Castillo  MR#: [id number] BIRTHDATE: [birth year] , 62  yrs. old GENDER: Male ENDOSCOPIST:Malaiah Viramontes, MD REFERRED BY:  [facility] PROCEDURE DATE:  11/02/2012 PROCEDURE:      upper endoscopy ASA CLASS: INDICATIONS:   hematochezia, was recently negative colonoscopy MEDICATION:    fentanyl 50 mcg, Versed 4 mg IV TOPICAL ANESTHETIC:    Cetacaine spray  DESCRIPTION OF PROCEDURE:   the patient was brought from his hospital room to the [facility] cone endoscopy unit. He was sedated after time out, and remained stable throughout the procedure. He did have some coughing following initial scope passage.  The vocal cords looked grossly normal.  The esophagus was normal in its entirety. A 1 cm hiatal hernia was present. No ring or stricture was evident. I did not see any Mallory-Weiss tear, reflux esophagitis, varices, infection, or neoplasia.  The stomach was entered. It contained no blood or coffee-ground material. There was a small amount of clear fluid present. In the antrum was a minimal, 1 x 3 mm superficial ulceration consistent with aspirin exposure. It was completely clean-based and was almost invisible. The remainder of the stomach was normal, including a retroflexed view of the cardia.  The pylorus, duodenal bulb, and second duodenum looked normal.  The scope was removed from the patient, who tolerated the procedure well. No biopsies were obtained.     COMPLICATIONS: None  ENDOSCOPIC IMPRESSION:  1. No bleeding or blood in the stomach at the time this exam 2. Minimal aspirin gastropathy characterized by tiny superficial ulceration in the antrum. However, there is no way that this is thought to be responsible for the patient's hematochezia yesterday. 3. No source of hematochezia evident on current exam   RECOMMENDATIONS:  1. Proceed to unprepped  sigmoidoscopic or colonoscopic evaluation to assess the distribution of blood within the colonic lumen   _______________________________ eSigned:  Bernette Redbird, MD 11/02/2012 9:35 AM    PATIENT NAME:  Lenoard Aden MR#: [id number]

## 2012-11-02 NOTE — Op Note (Signed)
[name] [address] [place], [address]   COLONOSCOPY PROCEDURE REPORT  PATIENT: Banyan, Goodchild  MR#: [id number] BIRTHDATE: 09/25/1950 , 62  yrs. old GENDER: Male ENDOSCOPIST: Bernette Redbird, MD REFERRED BY:   [facility] PROCEDURE DATE:  11/02/2012 PROCEDURE:     colonoscopy (unprepped, partial) ASA CLASS: INDICATIONS:  hematochezia with negative colonoscopy 5 days ago, and negative endoscopy this morning MEDICATIONS:    fentanyl 50 mcg, Versed 4 mg IV (including EGD)  DESCRIPTION OF PROCEDURE: the patient had been brought from his hospital room to the [facility] cone endoscopy unit. This procedure was performed immediately following this upper endoscopy. He was clinically stable for both procedures.  Perianal exam was unremarkable, and specifically, no prolapsed hemorrhoids, fissures, skin tags, fistulae, or abscess evident. Digital exam showed normal sphincter tone and a normal prostate gland.  The Pentax upper endoscope, which had been used for the patient's endoscopy, was also used for this exam and was advanced to approximately 70 cm, felt to correspond to the mid transverse colon. Pullback was then performed.  This procedure was intentionally done unprepped, so as to clarify the extent and distribution of blood within the colonic lumen.  There was no free blood present, nor any clots to speak of. However, there was blood tinged stool in the rectum, the sigmoid, in the descending colon.  Once I entered the transverse colon, however, stool had a brown appearance.  Where visualized, the colonic mucosa looked normal. Specifically, there was no evidence of colitis or other mucosal abnormalities such as stercoral ulceration. However, because of the unprepped character this exam, and the presence of a rather large amount of stool occupying the lumen up to the limit of the exam, it is certainly possible that small or focal abnormalities might  have been missed.  I did not see any cancer, masses, polyps, or diverticular change, nor any vascular ectasia, but again, on the unprepped exam, such lesions might have been missed.  Retroflexion in the rectum was unremarkable. Although was difficult to get an optimal view of the distal rectum and anal canal due to the presence of stool, it did not appear that this patient had major internal hemorrhoids.  The scope was removed and the patient tolerated the procedure well. No biopsies were obtained.     COMPLICATIONS: None  ENDOSCOPIC IMPRESSION:  1. No active bleeding at the time this exam, but blood-tinged stool present, consistent with recent bleeding.  2. Brown stool and transverse colon, blood-tinged stool present from the rectum to roughly the region of the splenic flexure.this would be compatible with either a distal source of bleeding, or a proximal source of transient bleeding with subsequent transit of the blood-tinged stool distally  RECOMMENDATIONS:  1. Since the patient has had recurrent bleeding necessitating 2 hospitalizations within the same week, I do feel that further evaluation is prudent. Accordingly, we will plan to do a repeat, prepped colonoscopy tomorrow and if that exam is negative, consider the patient for a possible capsule endoscopy the next day.    _______________________________ eSigned:  Bernette Redbird, MD 11/02/2012 9:46 AM     PATIENT NAME:  Lenoard Aden MR#: [id number]

## 2012-11-02 NOTE — Progress Notes (Signed)
Subjective: No complaints  Objective Vital signs in last 24 hours: Filed Vitals:   11/02/12 0930 11/02/12 0940 11/02/12 0950 11/02/12 1125  BP: 103/58 110/61 116/69 132/84  Pulse:    79  Temp:    98.7 F (37.1 C)  TempSrc:    Oral  Resp: 31 17 25 16   Height:      Weight:      SpO2: 100% 100% 100% 100%   Weight change:   Intake/Output Summary (Last 24 hours) at 11/02/12 1607 Last data filed at 11/01/12 2108  Gross per 24 hour  Intake    630 ml  Output   2900 ml  Net  -2270 ml   Labs: Basic Metabolic Panel:  Recent Labs Lab 10/27/12 1236 10/27/12 1900 11/01/12 0930 11/02/12 0645  NA 136 134* 136 137  K 5.9* 5.9* 4.7 4.0  CL 98 98 100 100  CO2 25 21 23 28   GLUCOSE 210* 173* 184* 137*  BUN 52* 57* 52* 31*  CREATININE 12.64* 12.68* 10.73* 7.15*  CALCIUM 9.3 8.9 9.1 8.2*  PHOS  --  6.1* 4.6  --    Liver Function Tests:  Recent Labs Lab 10/27/12 1236 10/27/12 1900 11/01/12 0930  AST 14  --   --   ALT 9  --   --   ALKPHOS 72  --   --   BILITOT 0.2*  --   --   PROT 7.4  --   --   ALBUMIN 2.7* 2.6* 2.6*   No results found for this basename: LIPASE, AMYLASE,  in the last 168 hours  Recent Labs Lab 10/28/12 0702  AMMONIA 17   CBC:  Recent Labs Lab 11/01/12 1444 11/01/12 1700 11/01/12 2218 11/02/12 0645  WBC 9.4 7.9 7.9 7.4  HGB 7.3* 6.9* 9.3* 8.6*  HCT 22.0* 20.6* 27.4* 25.4*  MCV 84.3 83.7 83.3 83.0  PLT 336 332 229 224   PT/INR: @LABRCNTIP (inr:5)   Scheduled Meds ) . brimonidine  1 drop Right Eye BID  . brinzolamide  1 drop Right Eye BID  . calcium acetate  1,334 mg Oral TID WC  . [START ON 11/08/2012] darbepoetin (ARANESP) injection - DIALYSIS  200 mcg Intravenous Q Tue-HD  . [START ON 11/03/2012] ferric gluconate (FERRLECIT/NULECIT) IV  125 mg Intravenous Q T,Th,Sa-HD  . insulin aspart  0-9 Units Subcutaneous TID WC  . insulin glargine  15 Units Subcutaneous Daily  . pantoprazole  40 mg Oral Daily  . polyethylene glycol  17 g Oral  Daily  . Travoprost (BAK Free)  1 drop Both Eyes QHS    Physical Exam:  Blood pressure 132/84, pulse 79, temperature 98.7 F (37.1 C), temperature source Oral, resp. rate 16, height 5\' 9"  (1.753 m), weight 95.5 kg (210 lb 8.6 oz), SpO2 100.00%.  General appearance: lethargic, but cooperative, in no distress  Head: Normocephalic, without obvious abnormality, atraumatic  Neck: no adenopathy, no carotid bruit, no JVD and supple, symmetrical, trachea midline  Resp: clear to auscultation bilaterally  Cardio: regular rate and rhythm, S1, S2 normal, no murmur, click, rub or gallop  GI: soft, non-tender; bowel sounds normal; no masses, no organomegaly  Extremities: no edema on right, cast on left lower leg  Neurologic: Grossly normal  Dialysis Access: Right IJ catheter, AVF @ [facility]   Dialysis Orders: Center: [place] on TTS.  EDW 94.5 kg HD Bath 2K/2Ca Time 4 hrs Heparin 5000 U. Access Right IJ catheter, also AVF @ [facility] BFR 400 DFR 800 Hectorol 6  mcg IV/HD Epogen 10,000 Units IV/HD Venofer 100 mg until 4/22.  Assessment/Plan:  1. Rectal bleeding - EGD and flex sig today, per GI 2. ESRD - HD on TTS @ [place]; K 4.7; has R IJ catheter and AVF, which he refuses to use at [place]. HD tomorrow. 3. Hypertension/volume - BP 144/81 on Labetalol 300 mg bid. 1kg up by wts, no vol excess on exam. 4. Anemia - Hgb 8's today; on Aranesp 200 mcg/wk in place of EPO, s/p prbc's 5. Metabolic bone disease - Ca 9.1 (10.2 corrected), P 4.6; Hectorol 6 mcg, Phoslo 2 with meals. 6. Nutrition - Alb 2.6, renal diet. 7. Hx left tibial fx - secondary to fall, s/p ORIF 2/14 by Dr. Dion Saucier. 8. Hx Hepatitis C 9. Hx CVA - in 07/2009. 10. Hx nephrolithiasis   Oscar Moselle  MD [phone] pgr    [phone] cell 11/02/2012, 4:07 PM

## 2012-11-03 ENCOUNTER — Inpatient Hospital Stay (HOSPITAL_COMMUNITY): Payer: Medicare Other

## 2012-11-03 ENCOUNTER — Encounter (HOSPITAL_COMMUNITY): Payer: Self-pay | Admitting: Gastroenterology

## 2012-11-03 ENCOUNTER — Encounter (HOSPITAL_COMMUNITY)
Admission: EM | Disposition: A | Discharge status: Home / Self Care | Payer: Self-pay | Source: Home / Self Care | Attending: Internal Medicine

## 2012-11-03 DIAGNOSIS — E1029 Type 1 diabetes mellitus with other diabetic kidney complication: Secondary | ICD-10-CM

## 2012-11-03 DIAGNOSIS — N058 Unspecified nephritic syndrome with other morphologic changes: Secondary | ICD-10-CM

## 2012-11-03 HISTORY — PX: COLONOSCOPY: SHX5424

## 2012-11-03 LAB — PREPARE RBC (CROSSMATCH)

## 2012-11-03 LAB — GLUCOSE, CAPILLARY: Glucose-Capillary: 146 mg/dL — ABNORMAL HIGH (ref 70–99)

## 2012-11-03 LAB — CBC
HCT: 25 % — ABNORMAL LOW (ref 39.0–52.0)
Hemoglobin: 8.4 g/dL — ABNORMAL LOW (ref 13.0–17.0)
MCH: 27.9 pg (ref 26.0–34.0)
MCHC: 33.6 g/dL (ref 30.0–36.0)
MCV: 83.1 fL (ref 78.0–100.0)

## 2012-11-03 SURGERY — COLONOSCOPY
Anesthesia: Moderate Sedation

## 2012-11-03 MED ORDER — LIDOCAINE-PRILOCAINE 2.5-2.5 % EX CREA
1.0000 "application " | TOPICAL_CREAM | CUTANEOUS | Status: DC | PRN
  Filled 2012-11-03: qty 5

## 2012-11-03 MED ORDER — SODIUM CHLORIDE 0.9 % IV SOLN: 100.0000 mL | INTRAVENOUS | Status: DC | PRN

## 2012-11-03 MED ORDER — NEPRO/CARBSTEADY PO LIQD
237.0000 mL | ORAL | Status: DC | PRN
  Filled 2012-11-03: qty 237

## 2012-11-03 MED ORDER — FENTANYL CITRATE 0.05 MG/ML IJ SOLN
INTRAMUSCULAR | Status: DC | PRN
  Administered 2012-11-03: 25 ug via INTRAVENOUS

## 2012-11-03 MED ORDER — TECHNETIUM TC 99M-LABELED RED BLOOD CELLS IV KIT
25.0000 | PACK | Freq: Once | INTRAVENOUS | Status: AC | PRN
Start: 2012-11-03 — End: 2012-11-03
  Administered 2012-11-03: 25 via INTRAVENOUS

## 2012-11-03 MED ORDER — DIPHENHYDRAMINE HCL 50 MG/ML IJ SOLN
INTRAMUSCULAR | Status: AC
  Filled 2012-11-03: qty 1

## 2012-11-03 MED ORDER — PENTAFLUOROPROP-TETRAFLUOROETH EX AERO: 1.0000 "application " | INHALATION_SPRAY | CUTANEOUS | Status: DC | PRN

## 2012-11-03 MED ORDER — HEPARIN SODIUM (PORCINE) 1000 UNIT/ML DIALYSIS
1000.0000 [IU] | INTRAMUSCULAR | Status: DC | PRN
  Filled 2012-11-03: qty 1

## 2012-11-03 MED ORDER — MIDAZOLAM HCL 5 MG/ML IJ SOLN
INTRAMUSCULAR | Status: AC
  Filled 2012-11-03: qty 3

## 2012-11-03 MED ORDER — LIDOCAINE HCL (PF) 1 % IJ SOLN: 5.0000 mL | INTRAMUSCULAR | Status: DC | PRN

## 2012-11-03 MED ORDER — FENTANYL CITRATE 0.05 MG/ML IJ SOLN
INTRAMUSCULAR | Status: AC
  Filled 2012-11-03: qty 4

## 2012-11-03 MED ORDER — ALTEPLASE 2 MG IJ SOLR: 2.0000 mg | Freq: Once | INTRAMUSCULAR | Status: DC | PRN

## 2012-11-03 MED ORDER — HYDRALAZINE HCL 20 MG/ML IJ SOLN
5.0000 mg | INTRAMUSCULAR | Status: DC | PRN
  Administered 2012-11-03: 5 mg via INTRAVENOUS
  Filled 2012-11-03: qty 1

## 2012-11-03 MED ORDER — MIDAZOLAM HCL 10 MG/2ML IJ SOLN
INTRAMUSCULAR | Status: DC | PRN
  Administered 2012-11-03: 1 mg via INTRAVENOUS
  Administered 2012-11-03: 2 mg via INTRAVENOUS

## 2012-11-03 NOTE — Progress Notes (Signed)
Patient ID: RIGBY LEONHARDT, male   DOB: Nov 06, 1950, 62 y.o.   MRN: [id number] Day of Surgery  Subjective: Pt feels ok today.  rebled after prep for c-scope.  No pain.  Objective: Vital signs in last 24 hours: Temp:  [98.5 F (36.9 C)-99.5 F (37.5 C)] 99.1 F (37.3 C) (04/10 1023) Pulse Rate:  [75-85] 85 (04/10 0754) Resp:  [12-34] 26 (04/10 1050) BP: (104-167)/(62-90) 140/84 mmHg (04/10 1053) SpO2:  [98 %-100 %] 100 % (04/10 1050) Last BM Date: 11/03/12  Intake/Output from previous day: 04/09 0701 - 04/10 0700 In: 4440 [P.O.:4440] Out: 1600 [Blood:1600] Intake/Output this shift: Total I/O In: 150 [I.V.:150] Out: 1 [Urine:1]  PE: Abd: soft, NT, ND  Lab Results:   Recent Labs  11/02/12 1950 11/03/12 0634  WBC 7.6 7.8  HGB 8.1* 8.4*  HCT 24.3* 25.0*  PLT 244 272   BMET  Recent Labs  11/01/12 0930 11/02/12 0645  NA 136 137  K 4.7 4.0  CL 100 100  CO2 23 28  GLUCOSE 184* 137*  BUN 52* 31*  CREATININE 10.73* 7.15*  CALCIUM 9.1 8.2*   PT/INR  Recent Labs  11/01/12 1128  LABPROT 14.6  INR 1.16   CMP     Component Value Date/Time   NA 137 11/02/2012 0645   K 4.0 11/02/2012 0645   CL 100 11/02/2012 0645   CO2 28 11/02/2012 0645   GLUCOSE 137* 11/02/2012 0645   BUN 31* 11/02/2012 0645   CREATININE 7.15* 11/02/2012 0645   CALCIUM 8.2* 11/02/2012 0645   PROT 7.4 10/27/2012 1236   ALBUMIN 2.6* 11/01/2012 0930   AST 14 10/27/2012 1236   ALT 9 10/27/2012 1236   ALKPHOS 72 10/27/2012 1236   BILITOT 0.2* 10/27/2012 1236   GFRNONAA 7* 11/02/2012 0645   GFRAA 9* 11/02/2012 0645   Lipase     Component Value Date/Time   LIPASE 17.0 11/06/2009 0948       Studies/Results: No results found.  Anti-infectives: Anti-infectives   None       Assessment/Plan  1. GI bleed, likely from small bowel  Plan: 1. c-scope results noted.  Patient has been sent down to [facility] for a bleeding scan.  Unfortunately, the small bowel will be hard to definitively localize enough for [place] to  proceed with any type of an operation.  If Nuc [facility] is successful, consider angiography for embolization.  If the patient stops bleeding, consider capsule endoscopy.  Will follow.       LOS: 2 days    Miata Culbreth E 11/03/2012, 1:18 PM Pager: [phone]

## 2012-11-03 NOTE — Progress Notes (Signed)
The patient's colonoscopy was well tolerated, and was pertinent for blood throughout the colonic lumen. The patient is actively bleeding, with an estimated blood loss of 500 mL during the course of the procedure, most of which was in the colonic lumen as some of which was expelled during the course of the procedure.  Please see dictated procedure report.  Although a specific bleeding site was not identified, the presence of blood throughout the colon, with a negative colonoscopy just a week ago, strongly suggests that he is bleeding from the small bowel (keeping in mind that his endoscopy yesterday was essentially normal). Unfortunately, prolonged efforts enter the terminal ileum were unsuccessful so I could not document blood within the small bowel lumen during his procedure today.  The current plan is to contact both interventional radiology and general surgery. The hospitalist physician is aware of these findings and is assisting in the patient's management.  Florencia Reasons, M.D. [phone]

## 2012-11-03 NOTE — Interval H&P Note (Signed)
History and Physical Interval Note:  11/03/2012 9:30 AM  Oscar Castillo  has presented today for surgery, with the diagnosis of Hematochezia  The various methods of treatment have been discussed with the patient. After consideration of risks, benefits and other options for treatment, the patient has consented to  Procedure(s): COLONOSCOPY (N/A) as a surgical intervention .  The patient's history has been reviewed, patient examined, no change in status, stable for surgery.  I have reviewed the patient's chart and labs.  Questions were answered to the patient's satisfaction.     Florencia Reasons

## 2012-11-03 NOTE — Progress Notes (Addendum)
[facility] Progress Note [place] TEAM 1 - Stepdown/ICU TEAM   Oscar Castillo:[id number] DOB: 14-Aug-1950 DOA: 11/01/2012 PCP: Oliver Barre, MD  Brief narrative: 62 y.o. male w/ h/o ESRD on HD Tues, Thurs, Saturday who was recently admitted to [facility] due to GI bleed which was worked up with colonoscopy which was negative except for hemorrhoids. Patient discharged on 10/30/12 and presented again on 11/01/12 with the same complaint, which occurred after patient had a large BM according to family member.  In the ED patient was found to have a hgb of 7.7 compared to prior to d/c which was 8.8. In the ED general surgery was consulted/   Assessment/Plan:  Acute blood loss anemia on Anemia due to chronic illness Hemoglobin appears to be slowly drifting back down -  have ordered 1 more units PRBC today  GI bleed GI is following - nondiagnostic EGD and unprepped colonoscopy 11/02/2012 -  Pt again noted to be bleeding on colonoscopy today- unfortunately by the time a tagged RBC scan could be performed, he had stopped bleeding- per Dr Matthias Hughs it was possible that the blood was coming from the small bowel- will discuss possible capsule endoscopy with Dr Matthias Hughs  HYPERTENSION w/ severe concentric LVH Blood pressure is currently well-controlled  GERD  DM type I controlled with renal manifestation CBGs are currently well-controlled  CKD stage V requiring chronic dialysis Continue usual dialysis schedule per nephrology  Chronic combined systolic (EF 45%) and grade 2diastolic congestive heart failure Volume management per dialysis - hemodynamically compensated at the present time  L tibial nail and ORIF - Feb 2014 Remains in case - f/u w/ Dr. Dion Saucier post d/c   HEPATITIS C, HX OF   CEREBROVASCULAR ACCIDENT, HX OF   BENIGN PROSTATIC HYPERTROPHY  Code Status: FULL Family Communication: Spoke with patient and wife Disposition Plan: cont to follow in SDU for re-bleed  Consultants: GI Deboraha Sprang  Gen Surg Nephrology  Procedures: 4/8 - bedside anoscopy - mild internal hemorrhoids - bleeding from above hemorrhoids 4/9 - EGD - No bleeding or blood in the stomach - Minimal aspirin gastropathy characterized by tiny superficial ulceration in the antrum - No source of hematochezia evident  4/9 - Colo - No active bleeding - blood-tinged stool present - will plan to do a repeat, prepped colonoscopy 4/10   Antibiotics: none  DVT prophylaxis: SCDs (cast to L LE)  HPI/Subjective: The patient is resting comfortably in his hospital bed.  He denies fevers chills nausea vomiting or shortness of breath.  He denies any obvious blood loss this morning.  Objective: Blood pressure 140/84, pulse 85, temperature 99.7 F (37.6 C), temperature source Oral, resp. rate 26, height 5\' 9"  (1.753 m), weight 95.5 kg (210 lb 8.6 oz), SpO2 100.00%.  Intake/Output Summary (Last 24 hours) at 11/03/12 1726 Last data filed at 11/03/12 1523  Gross per 24 hour  Intake   4150 ml  Output   1726 ml  Net   2424 ml   Exam: General: No acute respiratory distress Lungs: Clear to auscultation bilaterally without wheezes or crackles Cardiovascular: Regular rate and rhythm without gallop or rub  Abdomen: Nontender, nondistended, soft, bowel sounds positive, no rebound, no ascites, no appreciable mass Extremities: No significant cyanosis, clubbing, or edema R lower extremity - left lower extremity is in cast from just below knee to toes  Data Reviewed: Basic Metabolic Panel:  Recent Labs Lab 10/27/12 1900 11/01/12 0930 11/02/12 0645  NA 134* 136 137  K  5.9* 4.7 4.0  CL 98 100 100  CO2 21 23 28   GLUCOSE 173* 184* 137*  BUN 57* 52* 31*  CREATININE 12.68* 10.73* 7.15*  CALCIUM 8.9 9.1 8.2*  PHOS 6.1* 4.6  --    Liver Function Tests:  Recent Labs Lab 10/27/12 1900 11/01/12 0930  ALBUMIN 2.6* 2.6*    Recent Labs Lab 10/28/12 0702  AMMONIA 17   CBC:  Recent Labs Lab 11/01/12 1700  11/01/12 2218 11/02/12 0645 11/02/12 1950 11/03/12 0634  WBC 7.9 7.9 7.4 7.6 7.8  HGB 6.9* 9.3* 8.6* 8.1* 8.4*  HCT 20.6* 27.4* 25.4* 24.3* 25.0*  MCV 83.7 83.3 83.0 83.5 83.1  PLT 332 229 224 244 272   BNP (last 3 results)  Recent Labs  06/11/12 0916  PROBNP 6937.0*   CBG:  Recent Labs Lab 11/02/12 1626 11/02/12 2130 11/03/12 0727 11/03/12 0754 11/03/12 1651  GLUCAP 180* 144* 132* 146* 137*    Recent Results (from the past 240 hour(s))  MRSA PCR SCREENING     Status: None   Collection Time    10/28/12  1:29 AM      Result Value Range Status   MRSA by PCR NEGATIVE  NEGATIVE Final   Comment:            The GeneXpert MRSA Assay (FDA     approved for NASAL specimens     only), is one component of a     comprehensive MRSA colonization     surveillance program. It is not     intended to diagnose MRSA     infection nor to guide or     monitor treatment for     MRSA infections.     Studies:  Recent x-ray studies have been reviewed in detail by the Attending Physician  Scheduled Meds:  Scheduled Meds: . brimonidine  1 drop Right Eye BID  . brinzolamide  1 drop Right Eye BID  . calcium acetate  1,334 mg Oral TID WC  . [START ON 11/08/2012] darbepoetin (ARANESP) injection - DIALYSIS  200 mcg Intravenous Q Tue-HD  . ferric gluconate (FERRLECIT/NULECIT) IV  125 mg Intravenous Q T,Th,Sa-HD  . insulin aspart  0-9 Units Subcutaneous TID WC  . insulin glargine  15 Units Subcutaneous Daily  . pantoprazole  40 mg Oral Daily  . polyethylene glycol  17 g Oral Daily  . Travoprost (BAK Free)  1 drop Both Eyes QHS   Continuous Infusions:    Time spent on care of this patient:   Safina Huard, MD  [facility] Office  [phone] Pager - Text Page per Amion as per below:  On-Call/Text Page:      [email or web address]      password [email or web address]  If 7PM-7AM, please contact night-coverage [email or web address] Password [email or web address] 11/03/2012, 5:26 PM   LOS: 2 days

## 2012-11-03 NOTE — Progress Notes (Signed)
Agree with above 

## 2012-11-03 NOTE — Progress Notes (Signed)
Patient to dialysis, no complaints

## 2012-11-03 NOTE — Progress Notes (Signed)
Subjective:  In endo lab, had colonscopy this am which showed brisk bleeding, suspected small bowel source per GI.  May need embolization procedure  Objective Vital signs in last 24 hours: Filed Vitals:   11/03/12 1040 11/03/12 1045 11/03/12 1050 11/03/12 1053  BP: 138/78 153/78 153/78 140/84  Pulse:      Temp:      TempSrc:      Resp: 20 19 26    Height:      Weight:      SpO2: 100% 100% 100%    Weight change:   Intake/Output Summary (Last 24 hours) at 11/03/12 1122 Last data filed at 11/03/12 1052  Gross per 24 hour  Intake   4590 ml  Output   1601 ml  Net   2989 ml   Labs: Basic Metabolic Panel:  Recent Labs Lab 10/27/12 1236 10/27/12 1900 11/01/12 0930 11/02/12 0645  NA 136 134* 136 137  K 5.9* 5.9* 4.7 4.0  CL 98 98 100 100  CO2 25 21 23 28   GLUCOSE 210* 173* 184* 137*  BUN 52* 57* 52* 31*  CREATININE 12.64* 12.68* 10.73* 7.15*  CALCIUM 9.3 8.9 9.1 8.2*  PHOS  --  6.1* 4.6  --    Liver Function Tests:  Recent Labs Lab 10/27/12 1236 10/27/12 1900 11/01/12 0930  AST 14  --   --   ALT 9  --   --   ALKPHOS 72  --   --   BILITOT 0.2*  --   --   PROT 7.4  --   --   ALBUMIN 2.7* 2.6* 2.6*   No results found for this basename: LIPASE, AMYLASE,  in the last 168 hours  Recent Labs Lab 10/28/12 0702  AMMONIA 17   CBC:  Recent Labs Lab 11/01/12 2218 11/02/12 0645 11/02/12 1950 11/03/12 0634  WBC 7.9 7.4 7.6 7.8  HGB 9.3* 8.6* 8.1* 8.4*  HCT 27.4* 25.4* 24.3* 25.0*  MCV 83.3 83.0 83.5 83.1  PLT 229 224 244 272   PT/INR: @LABRCNTIP (inr:5)   Scheduled Meds ) . [MAR HOLD] brimonidine  1 drop Right Eye BID  . [MAR HOLD] brinzolamide  1 drop Right Eye BID  . [facility] HOLD] calcium acetate  1,334 mg Oral TID WC  . [MAR HOLD] darbepoetin (ARANESP) injection - DIALYSIS  200 mcg Intravenous Q Tue-HD  . [facility] HOLD] ferric gluconate (FERRLECIT/NULECIT) IV  125 mg Intravenous Q T,Th,Sa-HD  . [MAR HOLD] insulin aspart  0-9 Units Subcutaneous TID WC  .  [MAR HOLD] insulin glargine  15 Units Subcutaneous Daily  . [MAR HOLD] pantoprazole  40 mg Oral Daily  . [MAR HOLD] polyethylene glycol  17 g Oral Daily  . [MAR HOLD] Travoprost (BAK Free)  1 drop Both Eyes QHS    Physical Exam:  Blood pressure 140/84, pulse 85, temperature 99.1 F (37.3 C), temperature source Oral, resp. rate 26, height 5\' 9"  (1.753 m), weight 95.5 kg (210 lb 8.6 oz), SpO2 100.00%.  General appearance: lethargic, but cooperative, in no distress  Head: Normocephalic, without obvious abnormality, atraumatic  Neck: no adenopathy, no carotid bruit, no JVD and supple, symmetrical, trachea midline  Resp: clear to auscultation bilaterally  Cardio: regular rate and rhythm, S1, S2 normal, no murmur, click, rub or gallop  GI: soft, non-tender; bowel sounds normal; no masses, no organomegaly  Extremities: no edema on right, cast on left lower leg  Neurologic: Grossly normal  Dialysis Access: Right IJ catheter, AVF @ [facility]   Dialysis  Orders: Center: [place] on TTS.  EDW 94.5 kg HD Bath 2K/2Ca Time 4 hrs Heparin 5000 U. Access Right IJ catheter, also AVF @ [facility] BFR 400 DFR 800 Hectorol 6 mcg IV/HD Epogen 10,000 Units IV/HD Venofer 100 mg until 4/22.  Assessment/Plan:  1. GI bleed- brisk bleeding , may need embolization today. Will schedule HD to work around any procedures that need to be done today. Hb this am was 8.4 2. ESRD - HD on TTS @ [place]; K 4.7; has R IJ catheter and AVF, which he refused to use at [place]. AVF used 2 out of 3 sessions since admitted here. HD today 3. Hypertension/volume - BP 144/81 on Labetalol 300 mg bid. At dry weight 4. Anemia - Hgb 8.4 today; on Aranesp 200 mcg/wk in place of EPO. Bleeding actively, see above 5. Metabolic bone disease - Ca 9.1 (10.2 corrected), P 4.6; Hectorol 6 mcg, Phoslo 2 with meals. 6. Nutrition - Alb 2.6, renal diet. 7. Hx left tibial fx - secondary to fall, s/p ORIF 2/14 by Dr. Dion Saucier. 8. Hx Hepatitis C 9. Hx CVA - in 2011. 10. Hx  nephrolithiasis   Oscar Moselle  MD [phone] pgr    [phone] cell 11/03/2012, 11:22 AM

## 2012-11-03 NOTE — Op Note (Signed)
[name] [address] [place], [address]   COLONOSCOPY PROCEDURE REPORT  PATIENT: Oscar Castillo, Oscar Castillo  MR#: [id number] BIRTHDATE: Mar 08, 1951 , 62  yrs. old GENDER: Male ENDOSCOPIST: Bernette Redbird, MD REFERRED BY:   Unassigned PROCEDURE DATE:  11/03/2012 PROCEDURE:     colonoscopy ASA CLASS: INDICATIONS:  hematochezia of unclear cause. This patient was in the hospital last week with hematochezia, at which time a fully prepped colonoscopyto the cecum was normal. The patient had recurrent bleeding, prompting an endoscopy yesterday which was essentially normal (tiny clean-based antral ulcer), and an unprepped colonoscopy to the transverse colon which showed blood-tinged stool in the left colon and brown stool in the transverse colon. MEDICATIONS:    fentanyl 25 mcg, Versed 3 mg IV  DESCRIPTION OF PROCEDURE: the patient was brought from his hospital room to the [facility] cone endoscopy unit following a Fleet prep. Prior to the procedure, his vital signs were stable but he stated "he didn't feel good." He indicates that he had blood during the course of his prep.  Vital signs remained stable throughout the procedure. He received the above sedation following time out.  The patient was in a puddle of blood from the rectum at the start of the procedure.  The Pentax pediatric video colonoscope was advanced to the cecum as identified by visualization of the ileal cecal valve and appendiceal orifice.  The main finding on this exam was fresh blood and some clots throughout the colon.  No specific bleeding source was able to be identified. In addition, no potential bleeding site, such as diverticular disease or AVMs, was seen.  Prolonged efforts to enter the terminal ileum to confirm that the blood was coming from above the ileal cecal valve were unsuccessful.  The scope was removed from the patient, who actually tolerated the procedure well despite his  bleeding.  Estimated blood in the colonic lumen, plus that discharged during the course of the procedure, probably totaled about 500 cc.    COMPLICATIONS: None  ENDOSCOPIC IMPRESSION:  Active GI bleed, most likely from above the ileal cecal valve. No gross colonic abnormalities identified apart from blood throughout the colonic lumen.  RECOMMENDATIONS:  I have discussed the findings with Dr. Butler Denmark. She will call interventional radiology and I will call the surgeons. In the meantime, Dr. Butler Denmark is ordering a transfusion.   _______________________________ Rosalie DoctorBernette Redbird, MD 11/03/2012 10:38 AM     PATIENT NAME:  Oscar Castillo MR#: [id number]

## 2012-11-04 ENCOUNTER — Encounter (HOSPITAL_COMMUNITY)
Admission: EM | Disposition: A | Discharge status: Home / Self Care | Payer: Self-pay | Source: Home / Self Care | Attending: Internal Medicine

## 2012-11-04 HISTORY — PX: GIVENS CAPSULE STUDY: SHX5432

## 2012-11-04 LAB — GLUCOSE, CAPILLARY
Glucose-Capillary: 106 mg/dL — ABNORMAL HIGH (ref 70–99)
Glucose-Capillary: 189 mg/dL — ABNORMAL HIGH (ref 70–99)

## 2012-11-04 LAB — CBC
HCT: 23.2 % — ABNORMAL LOW (ref 39.0–52.0)
MCH: 27.4 pg (ref 26.0–34.0)
MCHC: 33.2 g/dL (ref 30.0–36.0)
MCV: 83.6 fL (ref 78.0–100.0)
Platelets: 231 10*3/uL (ref 150–400)
RDW: 17.1 % — ABNORMAL HIGH (ref 11.5–15.5)
RDW: 17 % — ABNORMAL HIGH (ref 11.5–15.5)
WBC: 8.2 10*3/uL (ref 4.0–10.5)

## 2012-11-04 SURGERY — IMAGING PROCEDURE, GI TRACT, INTRALUMINAL, VIA CAPSULE
Anesthesia: LOCAL

## 2012-11-04 MED ORDER — INSULIN GLARGINE 100 UNIT/ML ~~LOC~~ SOLN
5.0000 [IU] | Freq: Every day | SUBCUTANEOUS | Status: DC
  Administered 2012-11-05 – 2012-11-13 (×8): 5 [IU] via SUBCUTANEOUS
  Filled 2012-11-04 (×9): qty 0.05

## 2012-11-04 SURGICAL SUPPLY — 1 items: TOWEL COTTON PACK 4EA (MISCELLANEOUS) ×4 IMPLANT

## 2012-11-04 NOTE — Progress Notes (Signed)
Agree with above, he has had significant bleeds with transfusion but nothing localizes this.

## 2012-11-04 NOTE — Progress Notes (Signed)
Subjective:  Stopped bleeding reportedly, so is now getting capsule endo procedure. Hb 7.7 today   Objective Vital signs in last 24 hours: Filed Vitals:   11/04/12 0005 11/04/12 0410 11/04/12 0744 11/04/12 0746  BP: 158/90 135/70 131/78   Pulse: 90 79 80   Temp:  100 F (37.8 C)  98.7 F (37.1 C)  TempSrc:  Oral  Oral  Resp: 27 16 8    Height:      Weight:      SpO2: 100% 99% 98%    Weight change:   Intake/Output Summary (Last 24 hours) at 11/04/12 1017 Last data filed at 11/04/12 0900  Gross per 24 hour  Intake    850 ml  Output   2671 ml  Net  -1821 ml   Labs: Basic Metabolic Panel:  Recent Labs Lab 11/01/12 0930 11/02/12 0645  NA 136 137  K 4.7 4.0  CL 100 100  CO2 23 28  GLUCOSE 184* 137*  BUN 52* 31*  CREATININE 10.73* 7.15*  CALCIUM 9.1 8.2*  PHOS 4.6  --    Liver Function Tests:  Recent Labs Lab 11/01/12 0930  ALBUMIN 2.6*   No results found for this basename: LIPASE, AMYLASE,  in the last 168 hours No results found for this basename: AMMONIA,  in the last 168 hours CBC:  Recent Labs Lab 11/02/12 0645 11/02/12 1950 11/03/12 0634 11/04/12 0525  WBC 7.4 7.6 7.8 7.8  HGB 8.6* 8.1* 8.4* 7.7*  HCT 25.4* 24.3* 25.0* 23.2*  MCV 83.0 83.5 83.1 82.9  PLT 224 244 272 226   PT/INR: @LABRCNTIP (inr:5)   Scheduled Meds ) . brimonidine  1 drop Right Eye BID  . brinzolamide  1 drop Right Eye BID  . calcium acetate  1,334 mg Oral TID WC  . [START ON 11/08/2012] darbepoetin (ARANESP) injection - DIALYSIS  200 mcg Intravenous Q Tue-HD  . ferric gluconate (FERRLECIT/NULECIT) IV  125 mg Intravenous Q T,Th,Sa-HD  . insulin aspart  0-9 Units Subcutaneous TID WC  . insulin glargine  15 Units Subcutaneous Daily  . pantoprazole  40 mg Oral Daily  . polyethylene glycol  17 g Oral Daily  . Travoprost (BAK Free)  1 drop Both Eyes QHS    Physical Exam:  Blood pressure 131/78, pulse 80, temperature 98.7 F (37.1 C), temperature source Oral, resp. rate 8,  height 5\' 9"  (1.753 m), weight 92.4 kg (203 lb 11.3 oz), SpO2 98.00%.  Gen: alert, no distress Neck: no JVD  Resp: clear bilaterally  Cardio: RRR no rub or gallop GI: soft, non-tender; no HSM Ext: cast on left lower leg, no edema either leg Neuro: intact Dialysis Access: Right IJ catheter, AVF @ [facility]   Dialysis Orders: Center: [place] on TTS.  EDW 94.5 kg HD Bath 2K/2Ca Time 4 hrs Heparin 5000 U. Access Right IJ catheter, also AVF @ [facility] BFR 400 DFR 800 Hectorol 6 mcg IV/HD Epogen 10,000 Units IV/HD Venofer 100 mg until 4/22.  Assessment/Plan:  1. GI bleed- Hb 7.7, getting capsule endo today to look for SB lesions 2. ESRD - HD on TTS @ [place]; K 4.7; has R IJ catheter and AVF, which he refused to use at [place]. AVF used 2 out of 3 sessions since admitted here. HD tomorrow, no heparin 3. Hypertension/volume - BP 144/81 on Labetalol 300 mg bid. Below dry wt, BP normal, UF 1 kg as tolerated tomorrow 4. Anemia due to CKD/ABL - Hgb 8.4 today; on Aranesp 200 mcg/wk in place of  EPO 5. Metabolic bone disease - Ca 9.1 (10.2 corrected), P 4.6; Hectorol 6 mcg, Phoslo 2 with meals. 6. Nutrition - Alb 2.6, renal diet. 7. Hx left tibial fx - secondary to fall, s/p ORIF 2/14 by Dr. Dion Saucier. 8. Hx Hepatitis C 9. Hx CVA - in 2011. 10. Hx nephrolithiasis   Vinson Moselle  MD [phone] pgr    [phone] cell 11/04/2012, 10:17 AM

## 2012-11-04 NOTE — Progress Notes (Signed)
Utilization review completed.  

## 2012-11-04 NOTE — Progress Notes (Signed)
GASTROENTEROLOGY PROGRESS NOTE  Problem:   Recurrent hematochezia with negative colonoscopy last week, essentially negative endoscopy (tiny antral superficial ulceration) this week, and gross blood throughout the colon on colonoscopy yesterday.  Subjective: Patient reports no BM since yesterday.  Objective: Bleeding scan was negative, performed yesterday afternoon shortly after his colonoscopy showed blood throughout the colon.  Hemoglobin was stable yesterday, he received a blood transfusion, yet his hemoglobin today is lower, dropping from 8.8 to 7.7  Patient is hemodynamically stable, but somewhat somnolent. He states he didn't sleep much last night, finishing dialysis around midnight.  Assessment: Intermittent gross hemorrhage, presumably of small bowel origin  Plan: Capsule endoscopy in progress. It should be downloaded and ready for reading by midday tomorrow. I had discussed the purpose and risks of the procedure with the patient and he was agreeable to proceed with it.  We will continue to follow this patient with you, and will report on the results of the capsule endoscopy once they are available.  Florencia Reasons, M.D. [phone]    Florencia Reasons, M.D. 11/04/2012 1:08 PM

## 2012-11-04 NOTE — Progress Notes (Signed)
[facility] Progress Note [place] TEAM 1 - Stepdown/ICU TEAM   Oscar Castillo ZOX:[id number] DOB: 11/04/1950 DOA: 11/01/2012 PCP: Oliver Barre, MD  Brief narrative: 62 y.o. male w/ h/o ESRD on HD Tues, Thurs, Saturday who was recently admitted to [facility] due to GI bleed which was worked up with colonoscopy which was negative except for hemorrhoids. Patient discharged on 10/30/12 and presented again on 11/01/12 with the same complaint, which occurred after patient had a large BM according to family member.  In the ED patient was found to have a hgb of 7.7 compared to prior to d/c which was 8.8. In the ED general surgery was consulted/   Assessment/Plan:  Acute blood loss anemia on Anemia due to chronic illness Hemoglobin appears to be slowly drifting back down -  Given a 3rd unit of blood yesterday- will monitor and transfuse again if dropping further than 7- one unit on hold   GI bleed GI is following - nondiagnostic EGD and unprepped colonoscopy 11/02/2012 -  Pt again noted to be bleeding on colonoscopy today- unfortunately by the time a tagged RBC scan could be performed, he had stopped bleeding- per Dr Matthias Hughs it was possible that the blood was coming from the small bowel- Now undergoing a capsule endoscopy  HYPERTENSION w/ severe concentric LVH Blood pressure is currently well-controlled  GERD  DM type I controlled with renal manifestation CBGs are currently well-controlled  CKD stage V requiring chronic dialysis Continue usual dialysis schedule per nephrology  Chronic combined systolic (EF 45%) and grade 2diastolic congestive heart failure Volume management per dialysis - hemodynamically compensated at the present time  L tibial nail and ORIF - Feb 2014 Remains in case - f/u w/ Dr. Dion Saucier post d/c   HEPATITIS C, HX OF   CEREBROVASCULAR ACCIDENT, HX OF   BENIGN PROSTATIC HYPERTROPHY  Code Status: FULL Family Communication: Spoke with patient and wife Disposition Plan:  cont to follow in SDU for re-bleed  Consultants: GI Deboraha Sprang  Gen Surg Nephrology  Procedures: 4/8 - bedside anoscopy - mild internal hemorrhoids - bleeding from above hemorrhoids 4/9 - EGD - No bleeding or blood in the stomach - Minimal aspirin gastropathy characterized by tiny superficial ulceration in the antrum - No source of hematochezia evident  4/9 - Colo - No active bleeding - blood-tinged stool present - will plan to do a repeat, prepped colonoscopy 4/10   Antibiotics: none  DVT prophylaxis: SCDs (cast to L LE)  HPI/Subjective: The patient is in no distress- no further bowel movements  Objective: Blood pressure 151/85, pulse 85, temperature 98.9 F (37.2 C), temperature source Oral, resp. rate 17, height 5\' 9"  (1.753 m), weight 92.4 kg (203 lb 11.3 oz), SpO2 100.00%.  Intake/Output Summary (Last 24 hours) at 11/04/12 1604 Last data filed at 11/04/12 1203  Gross per 24 hour  Intake    700 ml  Output   2546 ml  Net  -1846 ml   Exam: General: No acute respiratory distress Lungs: Clear to auscultation bilaterally without wheezes or crackles Cardiovascular: Regular rate and rhythm without gallop or rub  Abdomen: Nontender, nondistended, soft, bowel sounds positive, no rebound, no ascites, no appreciable mass Extremities: No significant cyanosis, clubbing, or edema R lower extremity - left lower extremity is in cast from just below knee to toes  Data Reviewed: Basic Metabolic Panel:  Recent Labs Lab 11/01/12 0930 11/02/12 0645  NA 136 137  K 4.7 4.0  CL 100 100  CO2 23 28  GLUCOSE 184* 137*  BUN 52* 31*  CREATININE 10.73* 7.15*  CALCIUM 9.1 8.2*  PHOS 4.6  --    Liver Function Tests:  Recent Labs Lab 11/01/12 0930  ALBUMIN 2.6*   No results found for this basename: AMMONIA,  in the last 168 hours CBC:  Recent Labs Lab 11/01/12 2218 11/02/12 0645 11/02/12 1950 11/03/12 0634 11/04/12 0525  WBC 7.9 7.4 7.6 7.8 7.8  HGB 9.3* 8.6* 8.1* 8.4* 7.7*   HCT 27.4* 25.4* 24.3* 25.0* 23.2*  MCV 83.3 83.0 83.5 83.1 82.9  PLT 229 224 244 272 226   BNP (last 3 results)  Recent Labs  06/11/12 0916  PROBNP 6937.0*   CBG:  Recent Labs Lab 11/03/12 0727 11/03/12 0754 11/03/12 1651 11/03/12 2355 11/04/12 1205  GLUCAP 132* 146* 137* 110* 106*    Recent Results (from the past 240 hour(s))  MRSA PCR SCREENING     Status: None   Collection Time    10/28/12  1:29 AM      Result Value Range Status   MRSA by PCR NEGATIVE  NEGATIVE Final   Comment:            The GeneXpert MRSA Assay (FDA     approved for NASAL specimens     only), is one component of a     comprehensive MRSA colonization     surveillance program. It is not     intended to diagnose MRSA     infection nor to guide or     monitor treatment for     MRSA infections.     Studies:  Recent x-ray studies have been reviewed in detail by the Attending Physician  Scheduled Meds:  Scheduled Meds: . brimonidine  1 drop Right Eye BID  . brinzolamide  1 drop Right Eye BID  . calcium acetate  1,334 mg Oral TID WC  . [START ON 11/08/2012] darbepoetin (ARANESP) injection - DIALYSIS  200 mcg Intravenous Q Tue-HD  . ferric gluconate (FERRLECIT/NULECIT) IV  125 mg Intravenous Q T,Th,Sa-HD  . insulin aspart  0-9 Units Subcutaneous TID WC  . [START ON 11/05/2012] insulin glargine  5 Units Subcutaneous Daily  . pantoprazole  40 mg Oral Daily  . polyethylene glycol  17 g Oral Daily  . Travoprost (BAK Free)  1 drop Both Eyes QHS   Continuous Infusions:    Time spent on care of this patient:   Juni Glaab, MD  [facility] Office  [phone] Pager - Text Page per Amion as per below:  On-Call/Text Page:      [email or web address]      password [email or web address]  If 7PM-7AM, please contact night-coverage [email or web address] Password [email or web address] 11/04/2012, 4:04 PM   LOS: 3 days

## 2012-11-04 NOTE — Progress Notes (Signed)
Patient ID: Oscar Castillo, male   DOB: 28-Feb-1951, 62 y.o.   MRN: [id number] Day of Surgery  Subjective: Pt feels ok this morning.  Swallowed capsule already.  No further blood today  Objective: Vital signs in last 24 hours: Temp:  [98.2 F (36.8 C)-100 F (37.8 C)] 98.7 F (37.1 C) (04/11 0746) Pulse Rate:  [79-93] 79 (04/11 0410) Resp:  [1-34] 16 (04/11 0410) BP: (104-184)/(62-97) 135/70 mmHg (04/11 0410) SpO2:  [97 %-100 %] 99 % (04/11 0410) Weight:  [203 lb 11.3 oz (92.4 kg)-214 lb 8.1 oz (97.3 kg)] 203 lb 11.3 oz (92.4 kg) (04/10 2253) Last BM Date: 11/03/12  Intake/Output from previous day: 04/10 0701 - 04/11 0700 In: 850 [P.O.:240; I.V.:150; Blood:350; IV Piggyback:110] Out: 2672 [Urine:276] Intake/Output this shift:    PE: Abd: soft, Nt, ND, +BS  Lab Results:   Recent Labs  11/03/12 0634 11/04/12 0525  WBC 7.8 7.8  HGB 8.4* 7.7*  HCT 25.0* 23.2*  PLT 272 226   BMET  Recent Labs  11/01/12 0930 11/02/12 0645  NA 136 137  K 4.7 4.0  CL 100 100  CO2 23 28  GLUCOSE 184* 137*  BUN 52* 31*  CREATININE 10.73* 7.15*  CALCIUM 9.1 8.2*   PT/INR  Recent Labs  11/01/12 1128  LABPROT 14.6  INR 1.16   CMP     Component Value Date/Time   NA 137 11/02/2012 0645   K 4.0 11/02/2012 0645   CL 100 11/02/2012 0645   CO2 28 11/02/2012 0645   GLUCOSE 137* 11/02/2012 0645   BUN 31* 11/02/2012 0645   CREATININE 7.15* 11/02/2012 0645   CALCIUM 8.2* 11/02/2012 0645   PROT 7.4 10/27/2012 1236   ALBUMIN 2.6* 11/01/2012 0930   AST 14 10/27/2012 1236   ALT 9 10/27/2012 1236   ALKPHOS 72 10/27/2012 1236   BILITOT 0.2* 10/27/2012 1236   GFRNONAA 7* 11/02/2012 0645   GFRAA 9* 11/02/2012 0645   Lipase     Component Value Date/Time   LIPASE 17.0 11/06/2009 0948       Studies/Results: Nm Gi Blood Loss  11/03/2012  *RADIOLOGY REPORT*  Clinical Data: Bright red blood per rectum.  NUCLEAR MEDICINE GASTROINTESTINAL BLEEDING STUDY  Technique:  Sequential abdominal images were obtained  following intravenous administration of Tc-59m labeled red blood cells.  Radiopharmaceutical: CURIE ULTRATAG TECHNETIUM TC 60M- LABELED RED BLOOD CELLS IV KIT  Comparison: CT 10/27/2012  Findings: There is no radiotracer activity within the abdomen or pelvis to suggest active bleeding of tag red blood cells into the large or small bowel.  Physiologic activity noted within the blood pool and upper abdominal organs.  IMPRESSION: No scintigraphic evidence of active gastrointestinal bleeding.   Original Report Authenticated By: Genevive Bi, M.D.     Anti-infectives: Anti-infectives   None       Assessment/Plan  1. GI bleed, secondary to small bowel source, likely 2. Hep C 3. ESRD  Plan: 1. Will await capsule endoscopy results.   2. Unable to intervene surgical unless a fairly specific location is identified given this is a small bowel source.   LOS: 3 days    Nemesis Rainwater E 11/04/2012, 9:19 AM Pager: [phone]

## 2012-11-04 NOTE — Progress Notes (Signed)
Pt returned from Dialysis. VSS (though hypertensive). Pt endorses exhaustion following day's activities. Emotional support and clear liquid meal provided. Will continue to monitor and assess.

## 2012-11-05 LAB — CBC
Hemoglobin: 8.6 g/dL — ABNORMAL LOW (ref 13.0–17.0)
MCH: 27.9 pg (ref 26.0–34.0)
MCH: 28.4 pg (ref 26.0–34.0)
MCHC: 34.1 g/dL (ref 30.0–36.0)
MCV: 83 fL (ref 78.0–100.0)
MCV: 83.2 fL (ref 78.0–100.0)
Platelets: 277 10*3/uL (ref 150–400)
RBC: 2.65 MIL/uL — ABNORMAL LOW (ref 4.22–5.81)

## 2012-11-05 LAB — TYPE AND SCREEN
ABO/RH(D): O POS
Antibody Screen: NEGATIVE
Unit division: 0
Unit division: 0

## 2012-11-05 LAB — PREPARE RBC (CROSSMATCH)

## 2012-11-05 LAB — GLUCOSE, CAPILLARY

## 2012-11-05 MED ORDER — LIDOCAINE-PRILOCAINE 2.5-2.5 % EX CREA: 1.0000 "application " | TOPICAL_CREAM | CUTANEOUS | Status: DC | PRN

## 2012-11-05 MED ORDER — SODIUM CHLORIDE 0.9 % IV SOLN
INTRAVENOUS | Status: DC
  Administered 2012-11-08: 500 mL via INTRAVENOUS

## 2012-11-05 MED ORDER — LIDOCAINE HCL (PF) 1 % IJ SOLN: 5.0000 mL | INTRAMUSCULAR | Status: DC | PRN

## 2012-11-05 MED ORDER — PENTAFLUOROPROP-TETRAFLUOROETH EX AERO: 1.0000 "application " | INHALATION_SPRAY | CUTANEOUS | Status: DC | PRN

## 2012-11-05 MED ORDER — ALTEPLASE 2 MG IJ SOLR
2.0000 mg | Freq: Once | INTRAMUSCULAR | Status: DC | PRN
  Filled 2012-11-05: qty 2

## 2012-11-05 MED ORDER — HEPARIN SODIUM (PORCINE) 1000 UNIT/ML DIALYSIS
1000.0000 [IU] | INTRAMUSCULAR | Status: DC | PRN
  Filled 2012-11-05: qty 1

## 2012-11-05 MED ORDER — SODIUM CHLORIDE 0.9 % IV SOLN: 100.0000 mL | INTRAVENOUS | Status: DC | PRN

## 2012-11-05 MED ORDER — NEPRO/CARBSTEADY PO LIQD
237.0000 mL | ORAL | Status: DC | PRN
  Filled 2012-11-05: qty 237

## 2012-11-05 NOTE — Plan of Care (Signed)
Paged Dr. Arlean Hopping and he approved use of IV contrast for Mr Oscar Castillo today for CT enterography.

## 2012-11-05 NOTE — Progress Notes (Signed)
Pt had a BM with frank red blood in the stool. Notified Lenny Pastel on call for TRH. VSS. Pt AxO in no acute distress. Will continue to monitor. No new orders given. Pt having CBC drawn at this time.

## 2012-11-05 NOTE — Procedures (Signed)
I was present at this dialysis session. I have reviewed the session itself and made appropriate changes.   Vinson Moselle, MD [employer] 11/05/2012, 11:59 AM

## 2012-11-05 NOTE — Progress Notes (Signed)
Subjective: no complaints just frustrated by not knowing what's wrong  Objective Vital signs in last 24 hours: Filed Vitals:   11/05/12 0445 11/05/12 0500 11/05/12 0600 11/05/12 0737  BP: 140/83 129/72 146/80 142/93  Pulse: 84   82  Temp: 99 F (37.2 C)   98.5 F (36.9 C)  TempSrc: Oral   Oral  Resp: 4 12 4 18   Height:      Weight: 94.5 kg (208 lb 5.4 oz)     SpO2:    100%   Weight change: -2.8 kg (-6 lb 2.8 oz)  Intake/Output Summary (Last 24 hours) at 11/05/12 1057 Last data filed at 11/05/12 0800  Gross per 24 hour  Intake    480 ml  Output      0 ml  Net    480 ml   Labs: Basic Metabolic Panel:  Recent Labs Lab 11/01/12 0930 11/02/12 0645  NA 136 137  K 4.7 4.0  CL 100 100  CO2 23 28  GLUCOSE 184* 137*  BUN 52* 31*  CREATININE 10.73* 7.15*  CALCIUM 9.1 8.2*  PHOS 4.6  --    Liver Function Tests:  Recent Labs Lab 11/01/12 0930  ALBUMIN 2.6*   No results found for this basename: LIPASE, AMYLASE,  in the last 168 hours No results found for this basename: AMMONIA,  in the last 168 hours CBC:  Recent Labs Lab 11/03/12 0634 11/04/12 0525 11/04/12 1734 11/05/12 0500  WBC 7.8 7.8 8.2 8.8  HGB 8.4* 7.7* 8.0* 7.4*  HCT 25.0* 23.2* 24.4* 22.0*  MCV 83.1 82.9 83.6 83.0  PLT 272 226 231 277   PT/INR: @LABRCNTIP (inr:5)   Scheduled Meds ) . brimonidine  1 drop Right Eye BID  . brinzolamide  1 drop Right Eye BID  . calcium acetate  1,334 mg Oral TID WC  . [START ON 11/08/2012] darbepoetin (ARANESP) injection - DIALYSIS  200 mcg Intravenous Q Tue-HD  . ferric gluconate (FERRLECIT/NULECIT) IV  125 mg Intravenous Q T,Th,Sa-HD  . insulin aspart  0-9 Units Subcutaneous TID WC  . insulin glargine  5 Units Subcutaneous Daily  . pantoprazole  40 mg Oral Daily  . polyethylene glycol  17 g Oral Daily  . Travoprost (BAK Free)  1 drop Both Eyes QHS    Physical Exam:  Blood pressure 142/93, pulse 82, temperature 98.5 F (36.9 C), temperature source Oral,  resp. rate 18, height 5\' 9"  (1.753 m), weight 94.5 kg (208 lb 5.4 oz), SpO2 100.00%.  Gen: alert, no distress Neck: no JVD  Resp: clear bilaterally  Cardio: RRR no rub or gallop GI: soft, non-tender; no HSM Ext: cast on left lower leg, no edema either leg Neuro: intact Dialysis Access: Right IJ catheter, AVF @ LUA   Dialysis Orders: Center: [place] on TTS.  EDW 94.5 kg HD Bath 2K/2Ca Time 4 hrs Heparin 5000 U. Access Right IJ catheter, also AVF @ L[facility] BFR 400 DFR 800 Hectorol 6 mcg IV/HD Epogen 10,000 Units IV/HD Venofer 100 mg until 4/22.  Assessment/Plan:  1. GI bleed- Hb 7.4, capsule endo may have found bleeding source.  See GI notes 2. ESRD - HD on TTS @ [place]; K 4.7; has R IJ catheter and AVF, using AVF here. Transfuse 1 unit with HD today, no heparin 3. Hypertension/volume - BP 144/81 on Labetalol 300 mg bid. Below dry wt, BP normal, UF 1 kg as tolerated tomorrow 4. Anemia due to CKD/ABL - Hgb 8.4 today; on Aranesp 200 mcg/wk in place  of EPO. Got 2u prbc's on 4/8, 1unit on 4/10 and will get one unit today 4/12 with HD. 5. Metabolic bone disease - Ca 9.1 (10.2 corrected), P 4.6; Hectorol 6 mcg, Phoslo 2 with meals. 6. Nutrition - Alb 2.6, renal diet. 7. Hx left tibial fx - secondary to fall, s/p ORIF 2/14 by Dr. Dion Saucier. 8. Hx Hepatitis C 9. Hx CVA - in 2011. 10. Hx nephrolithiasis   Vinson Moselle  MD [phone] pgr    [phone] cell 11/05/2012, 10:57 AM

## 2012-11-05 NOTE — Progress Notes (Signed)
Oscar Castillo 10:19 AM  Subjective: Patient with some old blood being passed but no new complaints and his capsule endoscopy was reviewed as was his endoscopy and 2 colonoscopies by my partner and the case was discussed with his wife Objective: Vital signs stable afebrile exam unchanged hemoglobin slight decrease Capsule endoscopy: Questionable proximal small bowel ulcerative submucosal tumor with questionable minimal heme and no other small bowel blood but old blood in colon Assessment: Obscured GI bleeding initially thought to be hemorrhoidal questionably positive capsule endoscopy as above  Plan: As per capsule endoscopy report will proceed with CT enterography and pending those findings either repeat endoscopy using the pediatric colonoscope here or [facility] and I discussed the above with the patient and [name]

## 2012-11-05 NOTE — Progress Notes (Addendum)
[facility] Progress Note [place] TEAM 1 - Stepdown/ICU TEAM   Oscar Castillo ZOX:[id number] DOB: 1951/02/22 DOA: 11/01/2012 PCP: Oscar Barre, MD  Brief narrative: 62 y.o. male w/ h/o ESRD on HD Tues, Thurs, Saturday who was recently admitted to [facility] due to GI bleed which was worked up with colonoscopy which was negative except for hemorrhoids. Patient discharged on 10/30/12 and presented again on 11/01/12 with the same complaint, which occurred after patient had a large BM according to family member.  In the ED patient was found to have a hgb of 7.7 compared to prior to d/c which was 8.8. In the ED general surgery was consulted/   Assessment/Plan:  Acute blood loss anemia on Anemia due to chronic illness Hemoglobin appears to be slowly drifting back down -  Given a 3rd unit of blood yesterday- will monitor and transfuse again if dropping further than 7- will give him a second unit today  GI bleed GI is following - nondiagnostic EGD and unprepped colonoscopy 11/02/2012 -  Pt again noted to be bleeding on repeat colonoscopy- unfortunately by the time a tagged RBC scan could be performed, he had stopped bleeding - per Dr Matthias Hughs it was possible that the blood was coming from the small bowel- capsule endoscopy reveals possible ulcer versus mass and proximal mild bowel -CT enterography to be performed today  HYPERTENSION w/ severe concentric LVH Blood pressure is currently well-controlled  GERD  DM type I controlled with renal manifestation CBGs are currently well-controlled  CKD stage V requiring chronic dialysis Continue usual dialysis schedule per nephrology  Chronic combined systolic (EF 45%) and grade 2diastolic congestive heart failure Volume management per dialysis - hemodynamically compensated at the present time  L tibial nail and ORIF - Feb 2014 Remains in case - f/u w/ Dr. Dion Saucier post d/c   HEPATITIS C, HX OF   CEREBROVASCULAR ACCIDENT, HX OF   BENIGN PROSTATIC  HYPERTROPHY  Code Status: FULL Family Communication: Spoke with patient and wife Disposition Plan: cont to follow in SDU for re-bleed  Consultants: GI Deboraha Sprang  Gen Surg Nephrology  Procedures: 4/8 - bedside anoscopy - mild internal hemorrhoids - bleeding from above hemorrhoids 4/9 - EGD - No bleeding or blood in the stomach - Minimal aspirin gastropathy characterized by tiny superficial ulceration in the antrum - No source of hematochezia evident  4/9 - Colo - No active bleeding - blood-tinged stool present - will plan to do a repeat, prepped colonoscopy 4/10   Antibiotics: none  DVT prophylaxis: SCDs (cast to L LE)  HPI/Subjective: The patient was noted to have another bowel movement last night which was bright red blood  Objective: Blood pressure 151/93, pulse 87, temperature 98.6 F (37 C), temperature source Oral, resp. rate 18, height 5\' 9"  (1.753 m), weight 91.9 kg (202 lb 9.6 oz), SpO2 100.00%.  Intake/Output Summary (Last 24 hours) at 11/05/12 1653 Last data filed at 11/05/12 1526  Gross per 24 hour  Intake    930 ml  Output   3037 ml  Net  -2107 ml   Exam: General: No acute respiratory distress Lungs: Clear to auscultation bilaterally without wheezes or crackles Cardiovascular: Regular rate and rhythm without gallop or rub  Abdomen: Nontender, nondistended, soft, bowel sounds positive, no rebound, no ascites, no appreciable mass Extremities: No significant cyanosis, clubbing, or edema R lower extremity - left lower extremity is in cast from just below knee to toes  Data Reviewed: Basic Metabolic Panel:  Recent Labs  Lab 11/01/12 0930 11/02/12 0645  NA 136 137  K 4.7 4.0  CL 100 100  CO2 23 28  GLUCOSE 184* 137*  BUN 52* 31*  CREATININE 10.73* 7.15*  CALCIUM 9.1 8.2*  PHOS 4.6  --    Liver Function Tests:  Recent Labs Lab 11/01/12 0930  ALBUMIN 2.6*   No results found for this basename: AMMONIA,  in the last 168 hours CBC:  Recent Labs Lab  11/02/12 1950 11/03/12 0634 11/04/12 0525 11/04/12 1734 11/05/12 0500  WBC 7.6 7.8 7.8 8.2 8.8  HGB 8.1* 8.4* 7.7* 8.0* 7.4*  HCT 24.3* 25.0* 23.2* 24.4* 22.0*  MCV 83.5 83.1 82.9 83.6 83.0  PLT 244 272 226 231 277   BNP (last 3 results)  Recent Labs  06/11/12 0916  PROBNP 6937.0*   CBG:  Recent Labs Lab 11/03/12 2355 11/04/12 1205 11/04/12 1705 11/04/12 2212 11/05/12 0745  GLUCAP 110* 106* 189* 99 118*    Recent Results (from the past 240 hour(s))  MRSA PCR SCREENING     Status: None   Collection Time    10/28/12  1:29 AM      Result Value Range Status   MRSA by PCR NEGATIVE  NEGATIVE Final   Comment:            The GeneXpert MRSA Assay (FDA     approved for NASAL specimens     only), is one component of a     comprehensive MRSA colonization     surveillance program. It is not     intended to diagnose MRSA     infection nor to guide or     monitor treatment for     MRSA infections.     Studies:  Recent x-ray studies have been reviewed in detail by the Attending Physician  Scheduled Meds:  Scheduled Meds: . brimonidine  1 drop Right Eye BID  . brinzolamide  1 drop Right Eye BID  . calcium acetate  1,334 mg Oral TID WC  . [START ON 11/08/2012] darbepoetin (ARANESP) injection - DIALYSIS  200 mcg Intravenous Q Tue-HD  . ferric gluconate (FERRLECIT/NULECIT) IV  125 mg Intravenous Q T,Th,Sa-HD  . insulin aspart  0-9 Units Subcutaneous TID WC  . insulin glargine  5 Units Subcutaneous Daily  . pantoprazole  40 mg Oral Daily  . polyethylene glycol  17 g Oral Daily  . Travoprost (BAK Free)  1 drop Both Eyes QHS   Continuous Infusions: . sodium chloride      Time spent on care of this patient:   [name], MD  [facility] Office  [phone] Pager - Text Page per Loretha Stapler as per below:  On-Call/Text Page:      [email or web address]      password [email or web address]  If 7PM-7AM, please contact night-coverage [email or web address] Password [email or web address] 11/05/2012, 4:53  PM   LOS: 4 days

## 2012-11-06 ENCOUNTER — Inpatient Hospital Stay (HOSPITAL_COMMUNITY): Payer: Medicare Other

## 2012-11-06 LAB — GLUCOSE, CAPILLARY: Glucose-Capillary: 113 mg/dL — ABNORMAL HIGH (ref 70–99)

## 2012-11-06 LAB — TYPE AND SCREEN
ABO/RH(D): O POS
Antibody Screen: NEGATIVE

## 2012-11-06 LAB — CBC
HCT: 23.4 % — ABNORMAL LOW (ref 39.0–52.0)
HCT: 23.1 % — ABNORMAL LOW (ref 39.0–52.0)
Hemoglobin: 8 g/dL — ABNORMAL LOW (ref 13.0–17.0)
Hemoglobin: 7.8 g/dL — ABNORMAL LOW (ref 13.0–17.0)
RBC: 2.79 MIL/uL — ABNORMAL LOW (ref 4.22–5.81)
WBC: 7.9 10*3/uL (ref 4.0–10.5)
WBC: 7.9 10*3/uL (ref 4.0–10.5)

## 2012-11-06 MED ORDER — IOHEXOL 300 MG/ML  SOLN
100.0000 mL | Freq: Once | INTRAMUSCULAR | Status: AC | PRN
  Administered 2012-11-06: 100 mL via INTRAVENOUS

## 2012-11-06 NOTE — Progress Notes (Signed)
Oscar Castillo 11:39 AM  Subjective: Patient without any further bowel movement or signs of bleeding and his only complaint is being hungry and I discussed his case extensively with the hospital team and the patient  Objective: Vital signs stable afebrile hemoglobin fairly stable abdomen is soft nontender CT pending  Assessment: Questionable small bowel bleeding questionable ulcerative leiomyoma  Plan: Await CT okay to proceed with full liquids but probable endoscopy using the pediatric video colonoscope tomorrow unless CT shows that the lesion is too far down to reach  [facility] E

## 2012-11-06 NOTE — Progress Notes (Signed)
Subjective: no new problems  Objective Vital signs in last 24 hours: Filed Vitals:   11/05/12 1955 11/05/12 2347 11/06/12 0341 11/06/12 0800  BP: 157/97 119/65 129/72 122/73  Pulse: 88 86 85   Temp: 98.7 F (37.1 C) 99.6 F (37.6 C) 99.7 F (37.6 C) 99.6 F (37.6 C)  TempSrc: Oral Oral Oral Oral  Resp: 26 22 11 21   Height:      Weight:   93.3 kg (205 lb 11 oz)   SpO2: 100% 100% 99% 100%   Weight change: 0.2 kg (7.1 oz)  Intake/Output Summary (Last 24 hours) at 11/06/12 1054 Last data filed at 11/06/12 0342  Gross per 24 hour  Intake    690 ml  Output   3137 ml  Net  -2447 ml   Labs: Basic Metabolic Panel:  Recent Labs Lab 11/01/12 0930 11/02/12 0645  NA 136 137  K 4.7 4.0  CL 100 100  CO2 23 28  GLUCOSE 184* 137*  BUN 52* 31*  CREATININE 10.73* 7.15*  CALCIUM 9.1 8.2*  PHOS 4.6  --    Liver Function Tests:  Recent Labs Lab 11/01/12 0930  ALBUMIN 2.6*   No results found for this basename: LIPASE, AMYLASE,  in the last 168 hours No results found for this basename: AMMONIA,  in the last 168 hours CBC:  Recent Labs Lab 11/04/12 1734 11/05/12 0500 11/05/12 1858 11/06/12 0710  WBC 8.2 8.8 7.1 7.9  HGB 8.0* 7.4* 8.6* 8.0*  HCT 24.4* 22.0* 25.2* 23.4*  MCV 83.6 83.0 83.2 83.9  PLT 231 277 209 227   PT/INR: @LABRCNTIP (inr:5)   Scheduled Meds ) . brimonidine  1 drop Right Eye BID  . brinzolamide  1 drop Right Eye BID  . calcium acetate  1,334 mg Oral TID WC  . [START ON 11/08/2012] darbepoetin (ARANESP) injection - DIALYSIS  200 mcg Intravenous Q Tue-HD  . ferric gluconate (FERRLECIT/NULECIT) IV  125 mg Intravenous Q T,Th,Sa-HD  . insulin aspart  0-9 Units Subcutaneous TID WC  . insulin glargine  5 Units Subcutaneous Daily  . pantoprazole  40 mg Oral Daily  . polyethylene glycol  17 g Oral Daily  . Travoprost (BAK Free)  1 drop Both Eyes QHS    Physical Exam:  Blood pressure 122/73, pulse 85, temperature 99.6 F (37.6 C), temperature source  Oral, resp. rate 21, height 5\' 9"  (1.753 m), weight 93.3 kg (205 lb 11 oz), SpO2 100.00%.  Gen: alert, no distress Neck: no JVD  Resp: clear bilaterally  Cardio: RRR no rub or gallop GI: soft, non-tender; no HSM Ext: cast on left lower leg, no edema either leg Neuro: intact Dialysis Access: Right IJ catheter, AVF @ LUA   Dialysis Orders: Center: [place] on TTS.  EDW 94.5 kg HD Bath 2K/2Ca Time 4 hrs Heparin 5000 U. Access Right IJ catheter, also AVF @ LUA BFR 400 DFR 800 Hectorol 6 mcg IV/HD Epogen 10,000 Units IV/HD Venofer 100 mg until 4/22.  Assessment/Plan:  1. GI bleed- Hb 8.6, possible SB lesion on capsule endo; per GI/primary 2. ESRD - HD on TTS @ [place]; K 4.7; has R IJ catheter and AVF, using AVF here. No heparin dialysis. Consider having tunneled HD cath removed this week while in hospital 3. Hypertension/volume - BP 144/81 on Labetalol 300 mg bid. Below dry wt 4. Anemia due to CKD/ABL - Hgb 8.4 today; on Aranesp 200 mcg/wk in place of EPO. Got 2u prbc's on 4/8, 1unit on 4/10 and one  unit on 4/12 5. Metabolic bone disease - Ca 9.1 (10.2 corrected), P 4.6; Hectorol 6 mcg, Phoslo 2 with meals. 6. Nutrition - Alb 2.6, renal diet. 7. Hx left tibial fx - secondary to fall, s/p ORIF 2/14 by Dr. Dion Saucier. 8. Hx Hepatitis C 9. Hx CVA - in 2011. 10. Hx nephrolithiasis   Oscar Moselle  MD [phone] pgr    [phone] cell 11/06/2012, 10:54 AM

## 2012-11-06 NOTE — Progress Notes (Signed)
[facility] Progress Note [place] TEAM 1 - Stepdown/ICU TEAM   Oscar Castillo ZOX:[id number] DOB: [birth year] DOA: 11/01/2012 PCP: Oliver Barre, MD  Brief narrative: 62 y.o. male w/ h/o ESRD on HD Tues, Thurs, Saturday who was recently admitted to [facility] due to GI bleed which was worked up with colonoscopy which was negative except for hemorrhoids. Patient discharged on 10/30/12 and presented again on 11/01/12 with the same complaint, which occurred after patient had a large BM according to family member.  In the ED patient was found to have a hgb of 7.7 compared to prior to d/c which was 8.8. In the ED general surgery was consulted/   Assessment/Plan:  Acute blood loss anemia on Anemia due to chronic illness Hemoglobin appears to be slowly drifting back down -  Given a 3rd unit of blood yesterday- will monitor and transfuse again if dropping further than 7  GI bleed GI is following - nondiagnostic EGD and unprepped colonoscopy 11/02/2012 -  Pt again noted to be bleeding on repeat colonoscopy- unfortunately by the time a tagged RBC scan could be performed, he had stopped bleeding - per Dr Matthias Hughs it was possible that the blood was coming from the small bowel- capsule endoscopy reveals possible ulcer versus mass and proximal mild bowel -CT enterography unrevealing - per GI, if bleeding recurs, perform bleeding scan and angiogram if needed -possible endoscopy with pedes scope tomorrow  HYPERTENSION w/ severe concentric LVH Blood pressure is currently well-controlled  GERD  DM type I controlled with renal manifestation CBGs are currently well-controlled  CKD stage V requiring chronic dialysis Continue usual dialysis schedule per nephrology  Chronic combined systolic (EF 45%) and grade 2diastolic congestive heart failure Volume management per dialysis - hemodynamically compensated at the present time  L tibial nail and ORIF - Feb 2014 Remains in case - f/u w/ Dr. Dion Saucier post d/c    HEPATITIS C, HX OF   CEREBROVASCULAR ACCIDENT, HX OF   BENIGN PROSTATIC HYPERTROPHY  Code Status: FULL Family Communication: Spoke with patient and wife Disposition Plan: cont to follow in SDU for re-bleed  Consultants: GI Deboraha Sprang  Gen Surg Nephrology  Procedures: 4/8 - bedside anoscopy - mild internal hemorrhoids - bleeding from above hemorrhoids 4/9 - EGD - No bleeding or blood in the stomach - Minimal aspirin gastropathy characterized by tiny superficial ulceration in the antrum - No source of hematochezia evident  4/9 - Colo - No active bleeding - blood-tinged stool present - will plan to do a repeat, prepped colonoscopy 4/10   Antibiotics: none  DVT prophylaxis: SCDs (cast to L LE)  HPI/Subjective: The patient alert- no complaints other than being hungry  Objective: Blood pressure 162/95, pulse 85, temperature 99.6 F (37.6 C), temperature source Oral, resp. rate 21, height 5\' 9"  (1.753 m), weight 93.3 kg (205 lb 11 oz), SpO2 100.00%.  Intake/Output Summary (Last 24 hours) at 11/06/12 1551 Last data filed at 11/06/12 1200  Gross per 24 hour  Intake    360 ml  Output    150 ml  Net    210 ml   Exam: General: No acute respiratory distress Lungs: Clear to auscultation bilaterally without wheezes or crackles Cardiovascular: Regular rate and rhythm without gallop or rub  Abdomen: Nontender, nondistended, soft, bowel sounds positive, no rebound, no ascites, no appreciable mass Extremities: No significant cyanosis, clubbing, or edema R lower extremity - left lower extremity is in cast from just below knee to toes  Data Reviewed: Basic  Metabolic Panel:  Recent Labs Lab 11/01/12 0930 11/02/12 0645  NA 136 137  K 4.7 4.0  CL 100 100  CO2 23 28  GLUCOSE 184* 137*  BUN 52* 31*  CREATININE 10.73* 7.15*  CALCIUM 9.1 8.2*  PHOS 4.6  --    Liver Function Tests:  Recent Labs Lab 11/01/12 0930  ALBUMIN 2.6*   No results found for this basename: AMMONIA,   in the last 168 hours CBC:  Recent Labs Lab 11/04/12 0525 11/04/12 1734 11/05/12 0500 11/05/12 1858 11/06/12 0710  WBC 7.8 8.2 8.8 7.1 7.9  HGB 7.7* 8.0* 7.4* 8.6* 8.0*  HCT 23.2* 24.4* 22.0* 25.2* 23.4*  MCV 82.9 83.6 83.0 83.2 83.9  PLT 226 231 277 209 227   BNP (last 3 results)  Recent Labs  06/11/12 0916  PROBNP 6937.0*   CBG:  Recent Labs Lab 11/05/12 0745 11/05/12 1813 11/05/12 2135 11/06/12 0759 11/06/12 1239  GLUCAP 118* 124* 124* 114* 113*    Recent Results (from the past 240 hour(s))  MRSA PCR SCREENING     Status: None   Collection Time    10/28/12  1:29 AM      Result Value Range Status   MRSA by PCR NEGATIVE  NEGATIVE Final   Comment:            The GeneXpert MRSA Assay (FDA     approved for NASAL specimens     only), is one component of a     comprehensive MRSA colonization     surveillance program. It is not     intended to diagnose MRSA     infection nor to guide or     monitor treatment for     MRSA infections.     Studies:  Recent x-ray studies have been reviewed in detail by the Attending Physician  Scheduled Meds:  Scheduled Meds: . brimonidine  1 drop Right Eye BID  . brinzolamide  1 drop Right Eye BID  . calcium acetate  1,334 mg Oral TID WC  . [START ON 11/08/2012] darbepoetin (ARANESP) injection - DIALYSIS  200 mcg Intravenous Q Tue-HD  . ferric gluconate (FERRLECIT/NULECIT) IV  125 mg Intravenous Q T,Th,Sa-HD  . insulin aspart  0-9 Units Subcutaneous TID WC  . insulin glargine  5 Units Subcutaneous Daily  . pantoprazole  40 mg Oral Daily  . polyethylene glycol  17 g Oral Daily  . Travoprost (BAK Free)  1 drop Both Eyes QHS   Continuous Infusions: . sodium chloride      Time spent on care of this patient:   Casimira Sutphin, MD  [facility] Office  [phone] Pager - Text Page per Loretha Stapler as per below:  On-Call/Text Page:      [email or web address]      password [email or web address]  If 7PM-7AM, please contact  night-coverage [email or web address] Password [facility] 11/06/2012, 3:51 PM   LOS: 5 days

## 2012-11-06 NOTE — Progress Notes (Signed)
2 Days Post-Op  Subjective: Pt con't to do well.  No further GIB.   Objective: Vital signs in last 24 hours: Temp:  [98 F (36.7 C)-99.7 F (37.6 C)] 99.6 F (37.6 C) (04/13 0800) Pulse Rate:  [83-105] 85 (04/13 0341) Resp:  [8-26] 21 (04/13 0800) BP: (119-180)/(65-103) 122/73 mmHg (04/13 0800) SpO2:  [99 %-100 %] 100 % (04/13 0800) Weight:  [202 lb 9.6 oz (91.9 kg)-205 lb 11 oz (93.3 kg)] 205 lb 11 oz (93.3 kg) (04/13 0341) Last BM Date: 11/03/12  Intake/Output from previous day: 04/12 0701 - 04/13 0700 In: 930 [P.O.:480; I.V.:100; Blood:350] Out: 3137 [Urine:100] Intake/Output this shift:    General appearance: alert and cooperative GI: soft, non-tender; bowel sounds normal; no masses,  no organomegaly  Lab Results:   Recent Labs  11/05/12 1858 11/06/12 0710  WBC 7.1 7.9  HGB 8.6* 8.0*  HCT 25.2* 23.4*  PLT 209 227   BMET No results found for this basename: NA, K, CL, CO2, GLUCOSE, BUN, CREATININE, CALCIUM,  in the last 72 hours PT/INR No results found for this basename: LABPROT, INR,  in the last 72 hours ABG No results found for this basename: PHART, PCO2, PO2, HCO3,  in the last 72 hours  Studies/Results: No results found.  Anti-infectives: Anti-infectives   None      Assessment/Plan: s/p Procedure(s): GIVENS CAPSULE STUDY (N/A) Await CT results GI planning scope tomorrow if possible Following   LOS: 5 days    Marigene Ehlers., [facility] 11/06/2012

## 2012-11-07 ENCOUNTER — Encounter (HOSPITAL_COMMUNITY): Payer: Self-pay | Admitting: Gastroenterology

## 2012-11-07 LAB — GLUCOSE, CAPILLARY
Glucose-Capillary: 124 mg/dL — ABNORMAL HIGH (ref 70–99)
Glucose-Capillary: 124 mg/dL — ABNORMAL HIGH (ref 70–99)

## 2012-11-07 LAB — CBC
HCT: 22.8 % — ABNORMAL LOW (ref 39.0–52.0)
Hemoglobin: 7.6 g/dL — ABNORMAL LOW (ref 13.0–17.0)
MCHC: 34.5 g/dL (ref 30.0–36.0)
MCV: 82.9 fL (ref 78.0–100.0)
Platelets: 238 10*3/uL (ref 150–400)
RBC: 2.74 MIL/uL — ABNORMAL LOW (ref 4.22–5.81)
RDW: 15.6 % — ABNORMAL HIGH (ref 11.5–15.5)
RDW: 15.2 % (ref 11.5–15.5)
WBC: 7 10*3/uL (ref 4.0–10.5)
WBC: 7.9 10*3/uL (ref 4.0–10.5)

## 2012-11-07 MED ORDER — SODIUM CHLORIDE 0.9 % IV SOLN: 100.0000 mL | INTRAVENOUS | Status: DC | PRN

## 2012-11-07 MED ORDER — HEPARIN SODIUM (PORCINE) 1000 UNIT/ML DIALYSIS
1000.0000 [IU] | INTRAMUSCULAR | Status: DC | PRN
  Filled 2012-11-07: qty 1

## 2012-11-07 MED ORDER — LABETALOL HCL 100 MG PO TABS
100.0000 mg | ORAL_TABLET | Freq: Two times a day (BID) | ORAL | Status: DC
  Administered 2012-11-07 – 2012-11-13 (×10): 100 mg via ORAL
  Filled 2012-11-07 (×14): qty 1

## 2012-11-07 MED ORDER — LIDOCAINE-PRILOCAINE 2.5-2.5 % EX CREA
1.0000 "application " | TOPICAL_CREAM | CUTANEOUS | Status: DC | PRN
  Filled 2012-11-07: qty 5

## 2012-11-07 MED ORDER — PENTAFLUOROPROP-TETRAFLUOROETH EX AERO: 1.0000 "application " | INHALATION_SPRAY | CUTANEOUS | Status: DC | PRN

## 2012-11-07 MED ORDER — NEPRO/CARBSTEADY PO LIQD
237.0000 mL | ORAL | Status: DC | PRN
  Filled 2012-11-07: qty 237

## 2012-11-07 MED ORDER — ALTEPLASE 2 MG IJ SOLR
2.0000 mg | Freq: Once | INTRAMUSCULAR | Status: AC | PRN
  Filled 2012-11-07: qty 2

## 2012-11-07 MED ORDER — LIDOCAINE HCL (PF) 1 % IJ SOLN: 5.0000 mL | INTRAMUSCULAR | Status: DC | PRN

## 2012-11-07 MED ORDER — SODIUM CHLORIDE 0.9 % IV SOLN: INTRAVENOUS | Status: DC

## 2012-11-07 NOTE — Progress Notes (Signed)
No complaints of further bleeding. He was going to have EGD today but he ate breakfast. We will reschedule for tomorrow.

## 2012-11-07 NOTE — Progress Notes (Signed)
Patient ID: Oscar Castillo, male   DOB: January 15, 1951, 62 y.o.   MRN: [id number] 3 Days Post-Op  Subjective: Pt feels well.  No further BRB.  Objective: Vital signs in last 24 hours: Temp:  [98.3 F (36.8 C)-99.7 F (37.6 C)] 98.7 F (37.1 C) (04/14 0355) Pulse Rate:  [85-87] 87 (04/14 0355) Resp:  [11-20] 11 (04/14 0355) BP: (154-178)/(79-95) 178/90 mmHg (04/14 0355) SpO2:  [100 %] 100 % (04/14 0355) Last BM Date: 11/03/12  Intake/Output from previous day: 04/13 0701 - 04/14 0700 In: 120 [P.O.:120] Out: 150 [Urine:150] Intake/Output this shift:    PE: Abd: soft, NT, ND  Lab Results:   Recent Labs  11/06/12 1835 11/07/12 0640  WBC 7.9 7.0  HGB 7.8* 8.2*  HCT 23.1* 23.8*  PLT 217 238   BMET No results found for this basename: NA, K, CL, CO2, GLUCOSE, BUN, CREATININE, CALCIUM,  in the last 72 hours PT/INR No results found for this basename: LABPROT, INR,  in the last 72 hours CMP     Component Value Date/Time   NA 137 11/02/2012 0645   K 4.0 11/02/2012 0645   CL 100 11/02/2012 0645   CO2 28 11/02/2012 0645   GLUCOSE 137* 11/02/2012 0645   BUN 31* 11/02/2012 0645   CREATININE 7.15* 11/02/2012 0645   CALCIUM 8.2* 11/02/2012 0645   PROT 7.4 10/27/2012 1236   ALBUMIN 2.6* 11/01/2012 0930   AST 14 10/27/2012 1236   ALT 9 10/27/2012 1236   ALKPHOS 72 10/27/2012 1236   BILITOT 0.2* 10/27/2012 1236   GFRNONAA 7* 11/02/2012 0645   GFRAA 9* 11/02/2012 0645   Lipase     Component Value Date/Time   LIPASE 17.0 11/06/2009 0948       Studies/Results: Ct Entero Abd/pelvis W/cm  11/06/2012  *RADIOLOGY REPORT*  Clinical Data:  GI bleeding, evaluate for proximal small bowel tumor  CT ABDOMEN AND PELVIS WITH CONTRAST (CT ENTEROGRAPHY)  Technique:  Multidetector CT of the abdomen and pelvis during bolus administration of intravenous contrast. Negative oral contrast VoLumen was given.  Contrast: OMNIPAQUE IOHEXOL 300 MG/ML  SOLN  Comparison:  10/27/2012  Findings: Mild scarring versus atelectasis at  the left lung base.  Liver, spleen, pancreas, and adrenal glands are within normal limits.  Gallbladder is underdistended.  No intrahepatic or extrahepatic ductal dilatation.  Bilateral renal atrophy.  No hydronephrosis.  No evidence of bowel obstruction.  Normal appendix.  No small bowel wall thickening or mass is seen.  Atherosclerotic calcifications of the abdominal aorta and branch vessels.  No abdominopelvic ascites.  No suspicious abdominopelvic lymphadenopathy.  Prostate is at the upper limits normal for size, measuring 4.9 cm in transverse dimension, and indenting the base of the bladder.  Bladder is mildly thick-walled although underdistended.  Endoscopic camera within the descending colon (coronal image 72).  IMPRESSION: No small bowel wall thickening or mass is seen.  Endoscopic camera in the descending colon.  No evidence of bowel obstruction.  Normal appendix.   Original Report Authenticated By: Charline Bills, M.D.     Anti-infectives: Anti-infectives   None       Assessment/Plan  1. GI bleed, suspect SB source  Plan: 1. No lesions seen on CTE.  Per GI on decision to proceed with ped c-scope.  Will follow.   LOS: 6 days    Dorrien Grunder E 11/07/2012, 9:48 AM Pager: [phone]

## 2012-11-07 NOTE — Progress Notes (Signed)
[facility] Progress Note  TEAM 1 - Stepdown/ICU TEAM   SHIVAAN TIERNO RUE:[id number] DOB: [birth year] DOA: 11/01/2012 PCP: Oliver Barre, MD  Brief narrative: 62 y.o. male w/ h/o ESRD on HD Tues, Thurs, Saturday who was recently admitted to [facility] due to GI bleed which was worked up with colonoscopy which was negative except for hemorrhoids. Patient discharged on 10/30/12 and presented again on 11/01/12 with the same complaint, which occurred after patient had a large BM according to family member.   In the ED patient was found to have a hgb of 7.7 compared to prior to d/c which was 8.8.   Assessment/Plan:  Acute blood loss anemia on Anemia due to chronic illness Hemoglobin appears to be stable today -  monitor and transfuse again if drops below 7  GI bleed GI is following - nondiagnostic EGD and unprepped colonoscopy 11/02/2012 -  Pt noted to be bleeding on repeat colonoscopy - unfortunately by the time a tagged RBC scan could be performed, he had stopped bleeding -capsule endoscopy revealed possible ulcer versus mass of small bowel -CT enterography unrevealing -if brisk bleeding recurs, perform bleeding scan and angiogram as indicated -possible endoscopy with pedes scope 4/15  HYPERTENSION w/ severe concentric LVH Blood pressure poorly controlled today - meds adjusted  GERD  DM type I controlled with renal manifestation CBGs are currently well-controlled  CKD stage V requiring chronic dialysis Continue usual dialysis schedule per nephrology  Chronic combined systolic (EF 45%) and grade 2diastolic congestive heart failure Volume management per dialysis - hemodynamically compensated at the present time  L tibial nail and ORIF - Feb 2014 Remains in cast - f/u w/ Dr. Dion Saucier post d/c   HEPATITIS C, HX OF   CEREBROVASCULAR ACCIDENT, HX OF   BENIGN PROSTATIC HYPERTROPHY  Code Status: FULL Family Communication: Spoke with patient  Disposition Plan:   SDU  Consultants: GI Deboraha Sprang  Gen Surg Nephrology  Procedures: 4/8 - bedside anoscopy - mild internal hemorrhoids - bleeding from above hemorrhoids 4/9 - EGD - No bleeding or blood in the stomach - Minimal aspirin gastropathy characterized by tiny superficial ulceration in the antrum - No source of hematochezia evident  4/9 - Colo - No active bleeding - blood-tinged stool present - will plan to do a repeat, prepped colonoscopy 4/10   Antibiotics: none  DVT prophylaxis: SCDs (cast to L LE)  HPI/Subjective: The patient is alert and pleasant.  He denies any evidence of bleeding today.  He denies abdominal pain chest pain or shortness of breath.  Objective: Blood pressure 171/95, pulse 87, temperature 98.8 F (37.1 C), temperature source Oral, resp. rate 13, height 5\' 9"  (1.753 m), weight 93.3 kg (205 lb 11 oz), SpO2 100.00%.  Intake/Output Summary (Last 24 hours) at 11/07/12 1749 Last data filed at 11/07/12 0022  Gross per 24 hour  Intake      0 ml  Output    100 ml  Net   -100 ml   Exam: General: No acute respiratory distress Lungs: Clear to auscultation bilaterally without wheezes or crackles Cardiovascular: Regular rate and rhythm without gallop or rub  Abdomen: Nontender, nondistended, soft, bowel sounds positive, no rebound, no ascites, no appreciable mass Extremities: No significant cyanosis, clubbing, or edema R lower extremity - left lower extremity is in cast from just below knee > toes  Data Reviewed: Basic Metabolic Panel:  Recent Labs Lab 11/01/12 0930 11/02/12 0645  NA 136 137  K 4.7 4.0  CL 100 100  CO2 23 28  GLUCOSE 184* 137*  BUN 52* 31*  CREATININE 10.73* 7.15*  CALCIUM 9.1 8.2*  PHOS 4.6  --    Liver Function Tests:  Recent Labs Lab 11/01/12 0930  ALBUMIN 2.6*   CBC:  Recent Labs Lab 11/05/12 0500 11/05/12 1858 11/06/12 0710 11/06/12 1835 11/07/12 0640  WBC 8.8 7.1 7.9 7.9 7.0  HGB 7.4* 8.6* 8.0* 7.8* 8.2*  HCT 22.0* 25.2*  23.4* 23.1* 23.8*  MCV 83.0 83.2 83.9 84.0 82.9  PLT 277 209 227 217 238   BNP (last 3 results)  Recent Labs  06/11/12 0916  PROBNP 6937.0*   CBG:  Recent Labs Lab 11/06/12 1651 11/06/12 2151 11/07/12 0727 11/07/12 1206 11/07/12 1643  GLUCAP 147* 107* 89 124* 175*    Studies:  Recent x-ray studies have been reviewed in detail by the Attending Physician  Scheduled Meds:  Scheduled Meds: . brimonidine  1 drop Right Eye BID  . brinzolamide  1 drop Right Eye BID  . calcium acetate  1,334 mg Oral TID WC  . [START ON 11/08/2012] darbepoetin (ARANESP) injection - DIALYSIS  200 mcg Intravenous Q Tue-HD  . ferric gluconate (FERRLECIT/NULECIT) IV  125 mg Intravenous Q T,Th,Sa-HD  . insulin aspart  0-9 Units Subcutaneous TID WC  . insulin glargine  5 Units Subcutaneous Daily  . pantoprazole  40 mg Oral Daily  . polyethylene glycol  17 g Oral Daily  . Travoprost (BAK Free)  1 drop Both Eyes QHS   Continuous Infusions: . sodium chloride      Time spent on care of this patient:   Lonia Blood, MD  [facility] Office  [phone] Pager - Text Page per Amion as per below:  On-Call/Text Page:      [email or web address]      password [email or web address]  If 7PM-7AM, please contact night-coverage [email or web address] Password [facility] 11/07/2012, 5:49 PM   LOS: 6 days

## 2012-11-07 NOTE — Progress Notes (Signed)
[facility] ROUNDING NOTE   Subjective:   Interval History:no further bleeding  Objective:  Vital signs in last 24 hours:  Temp:  [98.3 F (36.8 C)-99.7 F (37.6 C)] 98.7 F (37.1 C) (04/14 0355) Pulse Rate:  [85-87] 87 (04/14 0355) Resp:  [11-20] 11 (04/14 0355) BP: (154-178)/(79-95) 178/90 mmHg (04/14 0355) SpO2:  [100 %] 100 % (04/14 0355)  Weight change:  Filed Weights   11/05/12 1140 11/05/12 1526 11/06/12 0341  Weight: 94.7 kg (208 lb 12.4 oz) 91.9 kg (202 lb 9.6 oz) 93.3 kg (205 lb 11 oz)    Intake/Output: I/O last 3 completed shifts: In: 120 [P.O.:120] Out: 150 [Urine:150]   Intake/Output this shift:     Gen: alert, no distress  Neck: no JVD  Resp: clear bilaterally  Cardio: RRR no rub or gallop  GI: soft, non-tender; no HSM  Ext: cast on left lower leg, no edema either leg  Neuro: intact  Dialysis Access: Right IJ catheter, AVF @ [facility]    Basic Metabolic Panel:  Recent Labs Lab 11/01/12 0930 11/02/12 0645  NA 136 137  K 4.7 4.0  CL 100 100  CO2 23 28  GLUCOSE 184* 137*  BUN 52* 31*  CREATININE 10.73* 7.15*  CALCIUM 9.1 8.2*  PHOS 4.6  --     Liver Function Tests:  Recent Labs Lab 11/01/12 0930  ALBUMIN 2.6*   No results found for this basename: LIPASE, AMYLASE,  in the last 168 hours No results found for this basename: AMMONIA,  in the last 168 hours  CBC:  Recent Labs Lab 11/05/12 0500 11/05/12 1858 11/06/12 0710 11/06/12 1835 11/07/12 0640  WBC 8.8 7.1 7.9 7.9 7.0  HGB 7.4* 8.6* 8.0* 7.8* 8.2*  HCT 22.0* 25.2* 23.4* 23.1* 23.8*  MCV 83.0 83.2 83.9 84.0 82.9  PLT 277 209 227 217 238    Cardiac Enzymes: No results found for this basename: CKTOTAL, CKMB, CKMBINDEX, TROPONINI,  in the last 168 hours  BNP: No components found with this basename: POCBNP,   CBG:  Recent Labs Lab 11/06/12 0759 11/06/12 1239 11/06/12 1651 11/06/12 2151 11/07/12 0727  GLUCAP 114* 113* 147* 107* 89     Microbiology: Results for orders placed during the hospital encounter of 10/27/12  MRSA PCR SCREENING     Status: None   Collection Time    10/28/12  1:29 AM      Result Value Range Status   MRSA by PCR NEGATIVE  NEGATIVE Final   Comment:            The GeneXpert MRSA Assay (FDA     approved for NASAL specimens     only), is one component of a     comprehensive MRSA colonization     surveillance program. It is not     intended to diagnose MRSA     infection nor to guide or     monitor treatment for     MRSA infections.    Coagulation Studies: No results found for this basename: LABPROT, INR,  in the last 72 hours  Urinalysis: No results found for this basename: COLORURINE, APPERANCEUR, LABSPEC, PHURINE, GLUCOSEU, HGBUR, BILIRUBINUR, KETONESUR, PROTEINUR, UROBILINOGEN, NITRITE, LEUKOCYTESUR,  in the last 72 hours    Imaging: Ct Entero Abd/pelvis W/cm  11/06/2012  *RADIOLOGY REPORT*  Clinical Data:  GI bleeding, evaluate for proximal small bowel tumor  CT ABDOMEN AND PELVIS WITH CONTRAST (CT ENTEROGRAPHY)  Technique:  Multidetector CT of the abdomen and pelvis during  bolus administration of intravenous contrast. Negative oral contrast VoLumen was given.  Contrast: OMNIPAQUE IOHEXOL 300 MG/ML  SOLN  Comparison:  10/27/2012  Findings: Mild scarring versus atelectasis at the left lung base.  Liver, spleen, pancreas, and adrenal glands are within normal limits.  Gallbladder is underdistended.  No intrahepatic or extrahepatic ductal dilatation.  Bilateral renal atrophy.  No hydronephrosis.  No evidence of bowel obstruction.  Normal appendix.  No small bowel wall thickening or mass is seen.  Atherosclerotic calcifications of the abdominal aorta and branch vessels.  No abdominopelvic ascites.  No suspicious abdominopelvic lymphadenopathy.  Prostate is at the upper limits normal for size, measuring 4.9 cm in transverse dimension, and indenting the base of the bladder.  Bladder is mildly  thick-walled although underdistended.  Endoscopic camera within the descending colon (coronal image 72).  IMPRESSION: No small bowel wall thickening or mass is seen.  Endoscopic camera in the descending colon.  No evidence of bowel obstruction.  Normal appendix.   Original Report Authenticated By: Charline Bills, M.D.      Medications:   . sodium chloride     . brimonidine  1 drop Right Eye BID  . brinzolamide  1 drop Right Eye BID  . calcium acetate  1,334 mg Oral TID WC  . [START ON 11/08/2012] darbepoetin (ARANESP) injection - DIALYSIS  200 mcg Intravenous Q Tue-HD  . ferric gluconate (FERRLECIT/NULECIT) IV  125 mg Intravenous Q T,Th,Sa-HD  . insulin aspart  0-9 Units Subcutaneous TID WC  . insulin glargine  5 Units Subcutaneous Daily  . pantoprazole  40 mg Oral Daily  . polyethylene glycol  17 g Oral Daily  . Travoprost (BAK Free)  1 drop Both Eyes QHS   acetaminophen, hydrALAZINE, ondansetron  Assessment/ Plan:  1. GI bleed- Hb 8.2, possible SB lesion on capsule endo; per GI/primary 2. ESRD - HD on TTS @ [place]; K 4.7; has R IJ catheter and AVF, using AVF here. No heparin dialysis. Will see how dialysis goes this week through avf 3. Hypertension/volume - BP 144/81 on Labetalol 300 mg bid. Below dry wt 4. Anemia due to CKD/ABL - Hgb 8.2 today; on Aranesp 200 mcg/wk in place of EPO. Got 2u prbc's on 4/8, 1unit on 4/10 and one unit on 4/12 5. Metabolic bone disease - Ca 9.1 (10.2 corrected), P 4.6; Hectorol 6 mcg, Phoslo 2 with meals. 6. Nutrition - Alb 2.6, renal diet. 7. Hx left tibial fx - secondary to fall, s/p ORIF 2/14 by Dr. Dion Saucier. 8. Hx Hepatitis C 9. Hx CVA - in 2011. 10. Hx nephrolithiasis  LOS: 6 Mikayah Joy W @TODAY @10 :51 AM

## 2012-11-08 ENCOUNTER — Encounter (HOSPITAL_COMMUNITY): Payer: Self-pay | Admitting: *Deleted

## 2012-11-08 ENCOUNTER — Encounter (HOSPITAL_COMMUNITY)
Admission: EM | Disposition: A | Discharge status: Home / Self Care | Payer: Self-pay | Source: Home / Self Care | Attending: Internal Medicine

## 2012-11-08 HISTORY — PX: ENTEROSCOPY: SHX5533

## 2012-11-08 LAB — RENAL FUNCTION PANEL
Albumin: 2.8 g/dL — ABNORMAL LOW (ref 3.5–5.2)
BUN: 31 mg/dL — ABNORMAL HIGH (ref 6–23)
CO2: 23 mEq/L (ref 19–32)
Calcium: 8.8 mg/dL (ref 8.4–10.5)
Chloride: 92 mEq/L — ABNORMAL LOW (ref 96–112)
Creatinine, Ser: 8.66 mg/dL — ABNORMAL HIGH (ref 0.50–1.35)
GFR calc Af Amer: 7 mL/min — ABNORMAL LOW (ref >90)
Glucose, Bld: 134 mg/dL — ABNORMAL HIGH (ref 70–99)
Phosphorus: 4.7 mg/dL — ABNORMAL HIGH (ref 2.3–4.6)

## 2012-11-08 LAB — GLUCOSE, CAPILLARY

## 2012-11-08 LAB — CBC
HCT: 21.7 % — ABNORMAL LOW (ref 39.0–52.0)
MCH: 28.4 pg (ref 26.0–34.0)
MCV: 82.2 fL (ref 78.0–100.0)
RDW: 15.3 % (ref 11.5–15.5)
WBC: 6.9 10*3/uL (ref 4.0–10.5)

## 2012-11-08 SURGERY — ENTEROSCOPY
Anesthesia: Moderate Sedation

## 2012-11-08 MED ORDER — GLYCOPYRROLATE 0.2 MG/ML IJ SOLN
INTRAMUSCULAR | Status: AC
  Filled 2012-11-08: qty 1

## 2012-11-08 MED ORDER — FENTANYL CITRATE 0.05 MG/ML IJ SOLN
INTRAMUSCULAR | Status: AC
  Filled 2012-11-08: qty 2

## 2012-11-08 MED ORDER — MIDAZOLAM HCL 5 MG/ML IJ SOLN
INTRAMUSCULAR | Status: AC
  Filled 2012-11-08: qty 2

## 2012-11-08 MED ORDER — SODIUM CHLORIDE 0.9 % IV SOLN: INTRAVENOUS | Status: DC

## 2012-11-08 MED ORDER — FENTANYL CITRATE 0.05 MG/ML IJ SOLN
INTRAMUSCULAR | Status: DC | PRN
  Administered 2012-11-08 (×3): 25 ug via INTRAVENOUS

## 2012-11-08 MED ORDER — GLYCOPYRROLATE 0.2 MG/ML IJ SOLN
INTRAMUSCULAR | Status: DC | PRN
  Administered 2012-11-08: 0.2 mg via INTRAVENOUS

## 2012-11-08 MED ORDER — SPOT INK MARKER SYRINGE KIT
PACK | SUBMUCOSAL | Status: AC
  Filled 2012-11-08: qty 5

## 2012-11-08 MED ORDER — MIDAZOLAM HCL 10 MG/2ML IJ SOLN
INTRAMUSCULAR | Status: DC | PRN
  Administered 2012-11-08: 1 mg via INTRAVENOUS
  Administered 2012-11-08 (×2): 2 mg via INTRAVENOUS

## 2012-11-08 MED ORDER — BUTAMBEN-TETRACAINE-BENZOCAINE 2-2-14 % EX AERO
INHALATION_SPRAY | CUTANEOUS | Status: DC | PRN
  Administered 2012-11-08: 2 via TOPICAL

## 2012-11-08 MED ORDER — DARBEPOETIN ALFA-POLYSORBATE 200 MCG/0.4ML IJ SOLN
INTRAMUSCULAR | Status: AC
  Administered 2012-11-08: 200 ug via INTRAVENOUS
  Filled 2012-11-08: qty 0.4

## 2012-11-08 NOTE — Progress Notes (Signed)
Pt returned from endo - VSS - HD notified of pt being back in room - CMT called

## 2012-11-08 NOTE — Progress Notes (Signed)
[facility] Progress Note [place] TEAM 1 - Stepdown/ICU TEAM   MORRISON MCBRYAR ZOX:[id number] DOB: May 26, 1951 DOA: 11/01/2012 PCP: Oliver Barre, MD  Brief narrative: 62 y.o. male w/ h/o ESRD on HD Tues, Thurs, Saturday who was recently admitted to [facility] due to GI bleed which was worked up with colonoscopy which was negative except for hemorrhoids. Patient discharged on 10/30/12 and presented again on 11/01/12 with the same complaint, which occurred after patient had a large BM according to family member.   In the ED patient was found to have a hgb of 7.7 compared to prior to d/c which was 8.8.   Assessment/Plan:  Acute blood loss anemia on Anemia due to chronic illness Hemoglobin appears to be stable today -  monitor and transfuse again if drops below 7  GI bleed GI is following - nondiagnostic EGD and unprepped colonoscopy 11/02/2012 -  Pt noted to be bleeding on repeat colonoscopy - unfortunately by the time a tagged RBC scan could be performed, he had stopped bleeding -capsule endoscopy revealed possible ulcer versus mass of small bowel -CT enterography unrevealing -if brisk bleeding recurs, perform bleeding scan and angiogram as indicated - endoscopy with pedes scope performed today also unrevealing  HYPERTENSION w/ severe concentric LVH Blood pressure poorly controlled today - meds adjusted  GERD  DM type I controlled with renal manifestation CBGs are currently well-controlled  CKD stage V requiring chronic dialysis Continue usual dialysis schedule per nephrology  Chronic combined systolic (EF 45%) and grade 2diastolic congestive heart failure Volume management per dialysis - hemodynamically compensated at the present time  L tibial nail and ORIF - Feb 2014 Remains in cast - f/u w/ Dr. Dion Saucier post d/c   HEPATITIS C, HX OF   CEREBROVASCULAR ACCIDENT, HX OF   BENIGN PROSTATIC HYPERTROPHY  Code Status: FULL Family Communication: Spoke with patient and wife Disposition  Plan:  SDU  Consultants: GI Deboraha Sprang  Gen Surg Nephrology  Procedures: 4/8 - bedside anoscopy - mild internal hemorrhoids - bleeding from above hemorrhoids 4/9 - EGD - No bleeding or blood in the stomach - Minimal aspirin gastropathy characterized by tiny superficial ulceration in the antrum - No source of hematochezia evident  4/9 - Colo - No active bleeding - blood-tinged stool present - will plan to do a repeat, prepped colonoscopy 4/10   Antibiotics: none  DVT prophylaxis: SCDs (cast to L LE)  HPI/Subjective: The patient is alert and pleasant.  He denies any evidence of bleeding today.  He denies abdominal pain chest pain or shortness of breath.  Objective: Blood pressure 123/81, pulse 98, temperature 98 F (36.7 C), temperature source Oral, resp. rate 16, height 5\' 9"  (1.753 m), weight 94.6 kg (208 lb 8.9 oz), SpO2 96.00%.  Intake/Output Summary (Last 24 hours) at 11/08/12 1955 Last data filed at 11/08/12 1719  Gross per 24 hour  Intake    240 ml  Output   2107 ml  Net  -1867 ml   Exam: General: No acute respiratory distress Lungs: Clear to auscultation bilaterally without wheezes or crackles Cardiovascular: Regular rate and rhythm without gallop or rub  Abdomen: Nontender, nondistended, soft, bowel sounds positive, no rebound, no ascites, no appreciable mass Extremities: No significant cyanosis, clubbing, or edema R lower extremity - left lower extremity is in cast from just below knee > toes  Data Reviewed: Basic Metabolic Panel:  Recent Labs Lab 11/02/12 0645 11/08/12 1240  NA 137 126*  K 4.0 5.8*  CL 100 92*  CO2 28 23  GLUCOSE 137* 134*  BUN 31* 31*  CREATININE 7.15* 8.66*  CALCIUM 8.2* 8.8  PHOS  --  4.7*   Liver Function Tests:  Recent Labs Lab 11/08/12 1240  ALBUMIN 2.8*   CBC:  Recent Labs Lab 11/06/12 0710 11/06/12 1835 11/07/12 0640 11/07/12 1932 11/08/12 0539  WBC 7.9 7.9 7.0 7.9 6.9  HGB 8.0* 7.8* 8.2* 7.6* 7.5*  HCT 23.4*  23.1* 23.8* 22.8* 21.7*  MCV 83.9 84.0 82.9 83.2 82.2  PLT 227 217 238 268 262   BNP (last 3 results)  Recent Labs  06/11/12 0916  PROBNP 6937.0*   CBG:  Recent Labs Lab 11/07/12 0727 11/07/12 1206 11/07/12 1643 11/07/12 2248 11/08/12 0753  GLUCAP 89 124* 175* 124* 95    Studies:  Recent x-ray studies have been reviewed in detail by the Attending Physician  Scheduled Meds:  Scheduled Meds: . brimonidine  1 drop Right Eye BID  . brinzolamide  1 drop Right Eye BID  . calcium acetate  1,334 mg Oral TID WC  . darbepoetin (ARANESP) injection - DIALYSIS  200 mcg Intravenous Q Tue-HD  . ferric gluconate (FERRLECIT/NULECIT) IV  125 mg Intravenous Q T,Th,Sa-HD  . insulin aspart  0-9 Units Subcutaneous TID WC  . insulin glargine  5 Units Subcutaneous Daily  . labetalol  100 mg Oral BID  . pantoprazole  40 mg Oral Daily  . polyethylene glycol  17 g Oral Daily  . Travoprost (BAK Free)  1 drop Both Eyes QHS   Continuous Infusions:    Time spent on care of this patient:   Quadir Muns, MD  [facility] Office  [phone] Pager - Text Page per Amion as per below:  On-Call/Text Page:      [email or web address]      password [email or web address]  If 7PM-7AM, please contact night-coverage [email or web address] Password [facility] 11/08/2012, 7:55 PM   LOS: 7 days

## 2012-11-08 NOTE — Progress Notes (Signed)
Pt peripheral IV out of date, Unable to find a suitable site to start IV, Paged IV therapy to come and assess.

## 2012-11-08 NOTE — Op Note (Signed)
[name] [address] [place], [address]   OPERATIVE PROCEDURE REPORT  PATIENT: Oscar Castillo, Oscar Castillo  MR#: [id number] BIRTHDATE: 13-Jun-1951 , 62  yrs. old GENDER: Male ENDOSCOPIST: Wandalee Ferdinand, MD REFERRED BY: PROCEDURE DATE: 11/08/2012 PROCEDURE:   upper endoscopy with small bowel enteroscopy ASA CLASS:   3 INDICATIONS:gastrointestinal bleeding with suggestion on a capsule endoscopy of an abnormality in the proximal jejunum as a potential source for bleeding. MEDICATIONS: fentanyl 75 mcg IV Versed 5 mg IV, Robinul 0.2 mg IV TOPICAL ANESTHETIC:   Cetacaine spray  DESCRIPTION OF PROCEDURE:   After the risks benefits and alternatives of the procedure were thoroughly explained, informed consent was obtained.  The [id number] ([id number])  endoscope was introduced through the mouth  and advanced to the jejunum. Image 12 below shows the pyloric channel. Image 9 shows the papilla of Vater. The scope was advanced easily down the small bowel into the jejunum well past the papilla of Vater. I estimate that the scope probably visualized about 4 feet or more past the papilla of Vater. The scope was advanced to 150 cm of its length. the small bowel was easily telescoped back over the scope allowing passage further down into the jejunum. At this point the patient started to get somewhat diaphoretic and belched, and therefore the scope was not advanced further.     ,      The instrument was slowly withdrawn as the mucosa was fully examined.    no lesions were seen in the small bowel. No blood was seen in the small bowel.        Re   The scope was then withdrawn from the patient and the procedure terminated.  COMPLICATIONS: There were no complications. ENDOSCOPIC IMPRESSION:normal small bowel enteroscopy from the extent of this exam see above RECOMMENDATIONS:observe for any signs of further bleeding  REPEAT EXAM: prn  _______________________________ Rosalie DoctorWandalee Ferdinand, MD 11/08/2012 10:51 AM   CC:

## 2012-11-08 NOTE — Progress Notes (Signed)
[facility] ROUNDING NOTE   Subjective:   Interval History:push enteroscopy today  Objective:  Vital signs in last 24 hours:  Temp:  [97.8 F (36.6 C)-99.7 F (37.6 C)] 98 F (36.7 C) (04/15 0856) Pulse Rate:  [75-80] 78 (04/15 0856) Resp:  [9-33] 18 (04/15 1043) BP: (149-198)/(74-114) 162/87 mmHg (04/15 1043) SpO2:  [94 %-100 %] 100 % (04/15 1043) Weight:  [92.9 kg (204 lb 12.9 oz)] 92.9 kg (204 lb 12.9 oz) (04/15 0443)  Weight change:  Filed Weights   11/05/12 1526 11/06/12 0341 11/08/12 0443  Weight: 91.9 kg (202 lb 9.6 oz) 93.3 kg (205 lb 11 oz) 92.9 kg (204 lb 12.9 oz)    Intake/Output: I/O last 3 completed shifts: In: 720 [P.O.:720] Out: 450 [Urine:450]   Intake/Output this shift:  Total I/O In: -  Out: 200 [Urine:200]  Gen: alert, no distress  Neck: no JVD  Resp: clear bilaterally  Cardio: RRR no rub or gallop  GI: soft, non-tender; no HSM  Ext: cast on left lower leg, no edema either leg  Neuro: intact  Dialysis Access: Right IJ catheter, AVF @ LUA     Basic Metabolic Panel:  Recent Labs Lab 11/02/12 0645  NA 137  K 4.0  CL 100  CO2 28  GLUCOSE 137*  BUN 31*  CREATININE 7.15*  CALCIUM 8.2*    Liver Function Tests: No results found for this basename: AST, ALT, ALKPHOS, BILITOT, PROT, ALBUMIN,  in the last 168 hours No results found for this basename: LIPASE, AMYLASE,  in the last 168 hours No results found for this basename: AMMONIA,  in the last 168 hours  CBC:  Recent Labs Lab 11/06/12 0710 11/06/12 1835 11/07/12 0640 11/07/12 1932 11/08/12 0539  WBC 7.9 7.9 7.0 7.9 6.9  HGB 8.0* 7.8* 8.2* 7.6* 7.5*  HCT 23.4* 23.1* 23.8* 22.8* 21.7*  MCV 83.9 84.0 82.9 83.2 82.2  PLT 227 217 238 268 262    Cardiac Enzymes: No results found for this basename: CKTOTAL, CKMB, CKMBINDEX, TROPONINI,  in the last 168 hours  BNP: No components found with this basename: POCBNP,   CBG:  Recent Labs Lab 11/07/12 0727  11/07/12 1206 11/07/12 1643 11/07/12 2248 11/08/12 0753  GLUCAP 89 124* 175* 124* 95    Microbiology: Results for orders placed during the hospital encounter of 10/27/12  MRSA PCR SCREENING     Status: None   Collection Time    10/28/12  1:29 AM      Result Value Range Status   MRSA by PCR NEGATIVE  NEGATIVE Final   Comment:            The GeneXpert MRSA Assay (FDA     approved for NASAL specimens     only), is one component of a     comprehensive MRSA colonization     surveillance program. It is not     intended to diagnose MRSA     infection nor to guide or     monitor treatment for     MRSA infections.    Coagulation Studies: No results found for this basename: LABPROT, INR,  in the last 72 hours  Urinalysis: No results found for this basename: COLORURINE, APPERANCEUR, LABSPEC, PHURINE, GLUCOSEU, HGBUR, BILIRUBINUR, KETONESUR, PROTEINUR, UROBILINOGEN, NITRITE, LEUKOCYTESUR,  in the last 72 hours    Imaging: No results found.   Medications:   . sodium chloride 500 mL (11/08/12 0854)  . sodium chloride    . sodium chloride     . [  MAR HOLD] brimonidine  1 drop Right Eye BID  . [MAR HOLD] brinzolamide  1 drop Right Eye BID  . [facility] HOLD] calcium acetate  1,334 mg Oral TID WC  . [MAR HOLD] darbepoetin (ARANESP) injection - DIALYSIS  200 mcg Intravenous Q Tue-HD  . [facility] HOLD] ferric gluconate (FERRLECIT/NULECIT) IV  125 mg Intravenous Q T,Th,Sa-HD  . [MAR HOLD] insulin aspart  0-9 Units Subcutaneous TID WC  . [MAR HOLD] insulin glargine  5 Units Subcutaneous Daily  . [facility] HOLD] labetalol  100 mg Oral BID  . [MAR HOLD] pantoprazole  40 mg Oral Daily  . [MAR HOLD] polyethylene glycol  17 g Oral Daily  . [MAR HOLD] Travoprost (BAK Free)  1 drop Both Eyes QHS   [MAR HOLD] sodium chloride, [MAR HOLD] sodium chloride, [MAR HOLD] acetaminophen, [MAR HOLD] feeding supplement (NEPRO CARB STEADY), [MAR HOLD] heparin, [MAR HOLD] hydrALAZINE, [MAR HOLD] lidocaine (PF), [MAR  HOLD] lidocaine-prilocaine, [MAR HOLD] ondansetron, [MAR HOLD] pentafluoroprop-tetrafluoroeth  Assessment/ Plan:  1. GI bleed- Hb 8.2, push enteroscopy today 2. ESRD - HD on TTS @ [place]; K 4.7; has R IJ catheter and AVF, using AVF here 3. Hypertension/volume - stable 4. Anemia due to CKD/ABL - transfused 5. Metabolic bone disease - controlled 6. Nutrition - Alb 2.6, renal diet. 7. Hx left tibial fx - secondary to fall, s/p ORIF 2/14 by Dr. Dion Saucier. 8. Hx Hepatitis C 9. Hx CVA - in 2011. 10. Hx nephrolithiasis  Plan dialysis today   LOS: 7 Oscar Castillo W @TODAY @10 :49 AM

## 2012-11-09 ENCOUNTER — Encounter (HOSPITAL_COMMUNITY): Payer: Self-pay | Admitting: Gastroenterology

## 2012-11-09 ENCOUNTER — Encounter (HOSPITAL_COMMUNITY)
Admission: EM | Disposition: A | Discharge status: Home / Self Care | Payer: Self-pay | Source: Home / Self Care | Attending: Internal Medicine

## 2012-11-09 LAB — GLUCOSE, CAPILLARY: Glucose-Capillary: 191 mg/dL — ABNORMAL HIGH (ref 70–99)

## 2012-11-09 LAB — CBC
MCHC: 33.1 g/dL (ref 30.0–36.0)
MCV: 85.1 fL (ref 78.0–100.0)
Platelets: 237 10*3/uL (ref 150–400)
RDW: 15.4 % (ref 11.5–15.5)
WBC: 8.7 10*3/uL (ref 4.0–10.5)

## 2012-11-09 SURGERY — EGD (ESOPHAGOGASTRODUODENOSCOPY)
Anesthesia: Moderate Sedation

## 2012-11-09 MED ORDER — SODIUM CHLORIDE 0.9 % IV SOLN: 100.0000 mL | INTRAVENOUS | Status: DC | PRN

## 2012-11-09 MED ORDER — ALTEPLASE 2 MG IJ SOLR: 2.0000 mg | Freq: Once | INTRAMUSCULAR | Status: AC | PRN

## 2012-11-09 MED ORDER — PENTAFLUOROPROP-TETRAFLUOROETH EX AERO: 1.0000 "application " | INHALATION_SPRAY | CUTANEOUS | Status: DC | PRN

## 2012-11-09 MED ORDER — HEPARIN SODIUM (PORCINE) 1000 UNIT/ML DIALYSIS: 1000.0000 [IU] | INTRAMUSCULAR | Status: DC | PRN

## 2012-11-09 MED ORDER — LIDOCAINE HCL (PF) 1 % IJ SOLN: 5.0000 mL | INTRAMUSCULAR | Status: DC | PRN

## 2012-11-09 MED ORDER — LIDOCAINE-PRILOCAINE 2.5-2.5 % EX CREA: 1.0000 "application " | TOPICAL_CREAM | CUTANEOUS | Status: DC | PRN

## 2012-11-09 MED ORDER — NEPRO/CARBSTEADY PO LIQD: 237.0000 mL | ORAL | Status: DC | PRN

## 2012-11-09 NOTE — Progress Notes (Signed)
[facility] Progress Note [place] TEAM 1 - Stepdown/ICU TEAM   Oscar Castillo NWG:[id number] DOB: Oct 19, 1950 DOA: 11/01/2012 PCP: Oliver Barre, MD  Brief narrative: 62 y.o. male w/ h/o ESRD on HD Tues, Thurs, Saturday who was recently admitted to [facility] due to GI bleed which was worked up with colonoscopy which was negative except for hemorrhoids. Patient discharged on 10/30/12 and presented again on 11/01/12 with the same complaint, which occurred after patient had a large BM according to family member.   In the ED patient was found to have a hgb of 7.7 compared to prior to d/c which was 8.8.   Assessment/Plan:  Acute blood loss anemia in addition to Anemia due to chronic illness Hemoglobin stable/climbing today -  Cont to monitor and transfuse again if drops below 7  GI bleed GI is following - nondiagnostic EGD and unprepped colonoscopy 11/02/2012 -  pt noted to be bleeding on repeat colonoscopy 4/10 - unfortunately by the time a tagged RBC scan could be performed, he had stopped bleeding - capsule endoscopy revealed possible ulcer versus mass of small bowel - CT enterography unrevealing - endoscopy with  small bowel enteroscopy 4/15 unrevealing - no bleeding today - if bleeding recurs, will need nuc med bleeding scan again, with possible angio as f/u   HYPERTENSION w/ severe concentric LVH Blood pressure controlled today   GERD Symptoms controlled   DM type I controlled with renal manifestation CBGs are reasonably controlled - no change in tx plan today   CKD stage V requiring chronic dialysis Continue usual dialysis schedule per Nephrology - contemplating removal of HD cath 4/17  Chronic combined systolic (EF 45%) and grade 2diastolic congestive heart failure Volume management per dialysis - hemodynamically compensated at the present time  L tibial nail and ORIF - Feb 2014 Remains in cast - f/u w/ Dr. Dion Saucier post d/c   HEPATITIS C, HX OF   CEREBROVASCULAR ACCIDENT, HX OF    BENIGN PROSTATIC HYPERTROPHY  Code Status: FULL Family Communication: Spoke with patient - no family present Disposition Plan:  Transfer to med bed   Consultants: GI Deboraha Sprang  Gen Surg Nephrology  Procedures: 4/8 - bedside anoscopy - mild internal hemorrhoids - bleeding from above hemorrhoids 4/9 - EGD - No bleeding or blood in the stomach - Minimal aspirin gastropathy characterized by tiny superficial ulceration in the antrum - No source of hematochezia evident  4/9 - Colo - No active bleeding - blood-tinged stool present - will plan to do a repeat, prepped colonoscopy 4/10  4/10 - prepped colo - Active GI bleed, most likely from above the ileal cecal valve. No gross colonic abnormalities identified apart from blood throughout the colonic lumen. 4/15 - upper endoscopy with small bowel enteroscopy - normal   Antibiotics: none  DVT prophylaxis: SCDs (cast to L LE)  HPI/Subjective: The patient is alert and pleasant.  He denies any evidence of bleeding again today.  He denies abdominal pain chest pain or shortness of breath.  Objective: Blood pressure 122/66, pulse 93, temperature 98.8 F (37.1 C), temperature source Oral, resp. rate 12, height 5\' 9"  (1.753 m), weight 92.6 kg (204 lb 2.3 oz), SpO2 96.00%.  Intake/Output Summary (Last 24 hours) at 11/09/12 1357 Last data filed at 11/09/12 1157  Gross per 24 hour  Intake    480 ml  Output   2007 ml  Net  -1527 ml   Exam: General: No acute respiratory distress Lungs: Clear to auscultation bilaterally without wheezes  or crackles Cardiovascular: Regular rate and rhythm without gallop or rub  Abdomen: Nontender, nondistended, soft, bowel sounds positive, no rebound, no ascites, no palpable mass Extremities: No significant cyanosis, clubbing, or edema R lower extremity - left lower extremity is in cast from just below knee to toes  Data Reviewed: Basic Metabolic Panel:  Recent Labs Lab 11/08/12 1240  NA 126*  K 5.8*  CL  92*  CO2 23  GLUCOSE 134*  BUN 31*  CREATININE 8.66*  CALCIUM 8.8  PHOS 4.7*   Liver Function Tests:  Recent Labs Lab 11/08/12 1240  ALBUMIN 2.8*   CBC:  Recent Labs Lab 11/06/12 1835 11/07/12 0640 11/07/12 1932 11/08/12 0539 11/09/12 0546  WBC 7.9 7.0 7.9 6.9 8.7  HGB 7.8* 8.2* 7.6* 7.5* 8.5*  HCT 23.1* 23.8* 22.8* 21.7* 25.7*  MCV 84.0 82.9 83.2 82.2 85.1  PLT 217 238 268 262 237   BNP (last 3 results)  Recent Labs  06/11/12 0916  PROBNP 6937.0*   CBG:  Recent Labs Lab 11/08/12 1126 11/08/12 1804 11/08/12 2127 11/09/12 0724 11/09/12 1222  GLUCAP 94 105* 249* 145* 202*    Studies:  Recent x-ray studies have been reviewed in detail by the Attending Physician  Scheduled Meds:  Scheduled Meds: . brimonidine  1 drop Right Eye BID  . brinzolamide  1 drop Right Eye BID  . calcium acetate  1,334 mg Oral TID WC  . darbepoetin (ARANESP) injection - DIALYSIS  200 mcg Intravenous Q Tue-HD  . ferric gluconate (FERRLECIT/NULECIT) IV  125 mg Intravenous Q T,Th,Sa-HD  . insulin aspart  0-9 Units Subcutaneous TID WC  . insulin glargine  5 Units Subcutaneous Daily  . labetalol  100 mg Oral BID  . pantoprazole  40 mg Oral Daily  . polyethylene glycol  17 g Oral Daily  . Travoprost (BAK Free)  1 drop Both Eyes QHS   Continuous Infusions:    Time spent on care of this patient:   Lonia Blood, MD  [facility] Office  [phone] Pager - Text Page per Amion as per below:  On-Call/Text Page:      [email or web address]      password [email or web address]  If 7PM-7AM, please contact night-coverage [email or web address] Password [email or web address] 11/09/2012, 1:57 PM   LOS: 8 days

## 2012-11-09 NOTE — Progress Notes (Signed)
[facility] ROUNDING NOTE   Subjective:   Interval History: no further bleeding but unremarkable work up  Objective:  Vital signs in last 24 hours:  Temp:  [98 F (36.7 C)-100 F (37.8 C)] 98.5 F (36.9 C) (04/16 0700) Pulse Rate:  [80-102] 93 (04/16 0700) Resp:  [9-33] 15 (04/16 0700) BP: (119-198)/(64-114) 119/76 mmHg (04/16 0700) SpO2:  [94 %-100 %] 96 % (04/16 0423) Weight:  [92.6 kg (204 lb 2.3 oz)-94.6 kg (208 lb 8.9 oz)] 92.6 kg (204 lb 2.3 oz) (04/16 0423)  Weight change: 1.7 kg (3 lb 12 oz) Filed Weights   11/08/12 0443 11/08/12 1302 11/09/12 0423  Weight: 92.9 kg (204 lb 12.9 oz) 94.6 kg (208 lb 8.9 oz) 92.6 kg (204 lb 2.3 oz)    Intake/Output: I/O last 3 completed shifts: In: 240 [P.O.:240] Out: 2107 [Urine:200; Other:1907]   Intake/Output this shift:    Gen: alert, no distress  Neck: no JVD  Resp: clear bilaterally  Cardio: RRR no rub or gallop  GI: soft, non-tender; no HSM  Ext: cast on left lower leg, no edema either leg  Neuro: intact  Dialysis Access: Right IJ catheter, AVF @ LUA     Basic Metabolic Panel:  Recent Labs Lab 11/08/12 1240  NA 126*  K 5.8*  CL 92*  CO2 23  GLUCOSE 134*  BUN 31*  CREATININE 8.66*  CALCIUM 8.8  PHOS 4.7*    Liver Function Tests:  Recent Labs Lab 11/08/12 1240  ALBUMIN 2.8*   No results found for this basename: LIPASE, AMYLASE,  in the last 168 hours No results found for this basename: AMMONIA,  in the last 168 hours  CBC:  Recent Labs Lab 11/06/12 1835 11/07/12 0640 11/07/12 1932 11/08/12 0539 11/09/12 0546  WBC 7.9 7.0 7.9 6.9 8.7  HGB 7.8* 8.2* 7.6* 7.5* 8.5*  HCT 23.1* 23.8* 22.8* 21.7* 25.7*  MCV 84.0 82.9 83.2 82.2 85.1  PLT 217 238 268 262 237    Cardiac Enzymes: No results found for this basename: CKTOTAL, CKMB, CKMBINDEX, TROPONINI,  in the last 168 hours  BNP: No components found with this basename: POCBNP,   CBG:  Recent Labs Lab 11/08/12 0753  11/08/12 1126 11/08/12 1804 11/08/12 2127 11/09/12 0724  GLUCAP 95 94 105* 249* 145*    Microbiology: Results for orders placed during the hospital encounter of 10/27/12  MRSA PCR SCREENING     Status: None   Collection Time    10/28/12  1:29 AM      Result Value Range Status   MRSA by PCR NEGATIVE  NEGATIVE Final   Comment:            The GeneXpert MRSA Assay (FDA     approved for NASAL specimens     only), is one component of a     comprehensive MRSA colonization     surveillance program. It is not     intended to diagnose MRSA     infection nor to guide or     monitor treatment for     MRSA infections.    Coagulation Studies: No results found for this basename: LABPROT, INR,  in the last 72 hours  Urinalysis: No results found for this basename: COLORURINE, APPERANCEUR, LABSPEC, PHURINE, GLUCOSEU, HGBUR, BILIRUBINUR, KETONESUR, PROTEINUR, UROBILINOGEN, NITRITE, LEUKOCYTESUR,  in the last 72 hours    Imaging: No results found.   Medications:     . brimonidine  1 drop Right Eye BID  . brinzolamide  1 drop Right Eye BID  . calcium acetate  1,334 mg Oral TID WC  . darbepoetin (ARANESP) injection - DIALYSIS  200 mcg Intravenous Q Tue-HD  . ferric gluconate (FERRLECIT/NULECIT) IV  125 mg Intravenous Q T,Th,Sa-HD  . insulin aspart  0-9 Units Subcutaneous TID WC  . insulin glargine  5 Units Subcutaneous Daily  . labetalol  100 mg Oral BID  . pantoprazole  40 mg Oral Daily  . polyethylene glycol  17 g Oral Daily  . Travoprost (BAK Free)  1 drop Both Eyes QHS   sodium chloride, sodium chloride, acetaminophen, feeding supplement (NEPRO CARB STEADY), heparin, hydrALAZINE, lidocaine (PF), lidocaine-prilocaine, ondansetron, pentafluoroprop-tetrafluoroeth  Assessment/ Plan:  1. GI bleed- improved Hb 8.5 ( 8.2), push enteroscopy unreveiling 2. ESRD - HD on TTS @ [place]; K 4.7; has R IJ catheter and AVF, using AVF here will continue to use and continue to monitor. May be able  to D/C catheter 3. Hypertension/volume - stable 4. Anemia due to CKD/ABL - transfused 5. Metabolic bone disease - controlled 6. Nutrition - Alb 2.6, renal diet. 7. Hx left tibial fx - secondary to fall, s/p ORIF 2/14 by Dr. Dion Saucier. 8. Hx Hepatitis C 9. Hx CVA - in 2011. 10. Hx nephrolithiasis   Plan dialysis tomorrow. D/c dialysis catheter    LOS: 8 Bunyan Brier W @TODAY @9 :38 AM

## 2012-11-09 NOTE — Progress Notes (Signed)
Pt txing to 6715, report called to RN , VSS, txed in [facility], wife notified of new room number

## 2012-11-09 NOTE — Progress Notes (Signed)
Endoscopy reviewed. Appreciate GI assist No source of bleed identified No more bleeding.   No role for surgery since pt is stable without any additional signs of bleeding. If rebleeds will likely need angio/nuc med scan  Call with ?  Mary Sella. Andrey Campanile, MD, FACS General, Bariatric, & Minimally Invasive Surgery [facility], [place]

## 2012-11-09 NOTE — Progress Notes (Signed)
Utilization review completed.  

## 2012-11-09 NOTE — Progress Notes (Signed)
Discussed with PA  Herbert Aguinaldo M. Willetta York, MD, FACS General, Bariatric, & Minimally Invasive Surgery [facility] [place]  

## 2012-11-09 NOTE — Progress Notes (Signed)
Patient ID: Oscar Castillo, male   DOB: 10/05/1950, 62 y.o.   MRN: [id number] 1 Day Post-Op  Subjective: Pt feels well.  No further bloody BMs.  Tolerating solid food  Objective: Vital signs in last 24 hours: Temp:  [98 F (36.7 C)-100 F (37.8 C)] 98.5 F (36.9 C) (04/16 0700) Pulse Rate:  [80-102] 93 (04/16 0700) Resp:  [9-33] 15 (04/16 0700) BP: (119-198)/(64-114) 119/76 mmHg (04/16 0700) SpO2:  [94 %-100 %] 96 % (04/16 0423) Weight:  [204 lb 2.3 oz (92.6 kg)-208 lb 8.9 oz (94.6 kg)] 204 lb 2.3 oz (92.6 kg) (04/16 0423) Last BM Date: 11/03/12  Intake/Output from previous day: 04/15 0701 - 04/16 0700 In: 240 [P.O.:240] Out: 2107 [Urine:200] Intake/Output this shift:    PE: Abd: soft, NT, ND  Lab Results:   Recent Labs  11/08/12 0539 11/09/12 0546  WBC 6.9 8.7  HGB 7.5* 8.5*  HCT 21.7* 25.7*  PLT 262 237   BMET  Recent Labs  11/08/12 1240  NA 126*  K 5.8*  CL 92*  CO2 23  GLUCOSE 134*  BUN 31*  CREATININE 8.66*  CALCIUM 8.8   PT/INR No results found for this basename: LABPROT, INR,  in the last 72 hours CMP     Component Value Date/Time   NA 126* 11/08/2012 1240   K 5.8* 11/08/2012 1240   CL 92* 11/08/2012 1240   CO2 23 11/08/2012 1240   GLUCOSE 134* 11/08/2012 1240   BUN 31* 11/08/2012 1240   CREATININE 8.66* 11/08/2012 1240   CALCIUM 8.8 11/08/2012 1240   PROT 7.4 10/27/2012 1236   ALBUMIN 2.8* 11/08/2012 1240   AST 14 10/27/2012 1236   ALT 9 10/27/2012 1236   ALKPHOS 72 10/27/2012 1236   BILITOT 0.2* 10/27/2012 1236   GFRNONAA 6* 11/08/2012 1240   GFRAA 7* 11/08/2012 1240   Lipase     Component Value Date/Time   LIPASE 17.0 11/06/2009 0948       Studies/Results: No results found.  Anti-infectives: Anti-infectives   None       Assessment/Plan  1. GI bleed from small bowel 2. ESRD  Plan: 1. Currently the patient is not bleeding.  Hopefully he has stopped.  No plans for surgical intervention at this point.  Please call back as needed.   LOS:  8 days    Jamirra Curnow E 11/09/2012, 9:23 AM Pager: [phone]

## 2012-11-09 NOTE — Progress Notes (Signed)
Patient transferred to room 6715 via wheelchair.  No c/o pain or SOB.  Call light in reach, bed in low position.  Patient instructed to call for assistance.

## 2012-11-10 DIAGNOSIS — I10 Essential (primary) hypertension: Secondary | ICD-10-CM

## 2012-11-10 DIAGNOSIS — I509 Heart failure, unspecified: Secondary | ICD-10-CM

## 2012-11-10 DIAGNOSIS — E785 Hyperlipidemia, unspecified: Secondary | ICD-10-CM

## 2012-11-10 DIAGNOSIS — I5042 Chronic combined systolic (congestive) and diastolic (congestive) heart failure: Secondary | ICD-10-CM

## 2012-11-10 LAB — CBC
HCT: 21.6 % — ABNORMAL LOW (ref 39.0–52.0)
Hemoglobin: 7.2 g/dL — ABNORMAL LOW (ref 13.0–17.0)
MCV: 84.4 fL (ref 78.0–100.0)
RBC: 2.56 MIL/uL — ABNORMAL LOW (ref 4.22–5.81)
WBC: 8.8 10*3/uL (ref 4.0–10.5)

## 2012-11-10 LAB — GLUCOSE, CAPILLARY: Glucose-Capillary: 161 mg/dL — ABNORMAL HIGH (ref 70–99)

## 2012-11-10 LAB — RENAL FUNCTION PANEL
Albumin: 2.5 g/dL — ABNORMAL LOW (ref 3.5–5.2)
BUN: 28 mg/dL — ABNORMAL HIGH (ref 6–23)
Calcium: 8.6 mg/dL (ref 8.4–10.5)
Chloride: 95 mEq/L — ABNORMAL LOW (ref 96–112)
Creatinine, Ser: 8.17 mg/dL — ABNORMAL HIGH (ref 0.50–1.35)
Phosphorus: 3.5 mg/dL (ref 2.3–4.6)

## 2012-11-10 NOTE — Progress Notes (Signed)
Subjective: No current complaints, no recent noticeable bleeding.  Objective: Vital signs in last 24 hours: Temp:  [98.8 F (37.1 C)-99.3 F (37.4 C)] 99.3 F (37.4 C) (04/17 0549) Pulse Rate:  [84-89] 89 (04/17 0549) Resp:  [12-19] 17 (04/17 0549) BP: (116-178)/(66-96) 138/78 mmHg (04/17 0549) SpO2:  [96 %-100 %] 99 % (04/17 0549) Weight change:   Intake/Output from previous day: 04/16 0701 - 04/17 0700 In: 720 [P.O.:720] Out: 100 [Urine:100]   EXAM: General appearance:  Alert, in no apparent distress Resp:  CTA without rales, rhonchi, or wheezes Cardio:  RRR without murmur or rub GI:  + BS, soft and nontender Extremities:  No edema, cast on left leg Access: AVF @ L[facility], right IJ catheter  Lab Results:  Recent Labs  11/09/12 0546 11/10/12 0500  WBC 8.7 8.8  HGB 8.5* 7.2*  HCT 25.7* 21.6*  PLT 237 260   BMET:  Recent Labs  11/08/12 1240  NA 126*  K 5.8*  CL 92*  CO2 23  GLUCOSE 134*  BUN 31*  CREATININE 8.66*  CALCIUM 8.8  ALBUMIN 2.8*   No results found for this basename: PTH,  in the last 72 hours Iron Studies: No results found for this basename: IRON, TIBC, TRANSFERRIN, FERRITIN,  in the last 72 hours  Dialysis Orders: Center: [place] on TTS.  EDW 94.5 kg HD Bath 2K/2Ca Time 4 hrs Heparin 5000 U. Access Right IJ catheter, also AVF @ LUA BFR 400 DFR 800 Hectorol 6 mcg IV/HD Epogen 10,000 Units IV/HD Venofer 100 mg until 4/22.  Assessment/Plan: 1. GI bleed - no recent bleeding; s/p EGD and colonoscopy, endoscopy with small bowel enteroscopy 4/15 unrevealing; per GI/primary.  Further evaluation if bleeding recurs. 2. ESRD - HD on TTS @ [place]; has R IJ catheter and AVF, using AVF here. No heparin dialysis. Consider having tunneled HD cath removed this week while in hospital. 3. Hypertension/volume - BP 138/78 on Labetalol 100 mg bid; current wt 92.6 kg, below EDW. 4. Anemia due to CKD/ABL - Hgb dropped to 7.2 (8.5 yesterday); on Aranesp 200 mcg on Tues in place  of EPO, received 2 U PRBCs on 4/8, 1 U on 4/10 and 1 U on 4/12.  Check CBC pre-HD. 5. Metabolic bone disease - Ca 8.8 (9.8 corrected), P 4.7; Hectorol 6 mcg, Phoslo 2 with meals. 6. Nutrition - Alb 2.8, renal diet. 7. Hx left tibial fx - secondary to fall, s/p ORIF 2/14 by Dr. Dion Saucier. 8. Hx Hepatitis C 9. Hx CVA - in 2011. 10. Hx nephrolithiasis     LOS: 9 days   LYLES,Oscar Castillo 11/10/2012,8:53 AM  Patient seen and examined.  Agree with assessment and plan as above. Hb 7.2 today, 8.5 yesterday. Gets HD today. Rec's as above Vinson Moselle  MD [phone] pgr    [phone] cell 11/10/2012, 11:20 AM

## 2012-11-10 NOTE — Progress Notes (Signed)
[facility] Progress Note [place] TEAM 1 - Stepdown/ICU TEAM   Oscar Castillo ZOX:[id number] DOB: 1951/07/03 DOA: 11/01/2012 PCP: Oliver Barre, MD  Brief narrative: 62 y.o. male w/ h/o ESRD on HD Tues, Thurs, Saturday who was recently admitted to [facility] due to GI bleed which was worked up with colonoscopy which was negative except for hemorrhoids. Patient discharged on 10/30/12 and presented again on 11/01/12 with the same complaint, which occurred after patient had a large BM according to family member.   In the ED patient was found to have a hgb of 7.7 compared to prior to d/c which was 8.8.   Assessment/Plan:  Acute blood loss anemia in addition to Anemia due to chronic illness Hemoglobin stable  Cont to monitor and transfuse again if drops below 7.  No active bleeding.  Will recheck cbc next am.  GI bleed GI is following - nondiagnostic EGD and unprepped colonoscopy 11/02/2012 -  pt noted to be bleeding on repeat colonoscopy 4/10 - unfortunately by the time a tagged RBC scan could be performed, he had stopped bleeding - capsule endoscopy revealed possible ulcer versus mass of small bowel - CT enterography unrevealing - endoscopy with  small bowel enteroscopy 4/15 unrevealing - no bleeding today - if bleeding recurs, will need nuc med bleeding scan again, with possible angio as f/u   HYPERTENSION w/ severe concentric LVH Blood pressure controlled today   GERD Symptoms controlled   DM type I controlled with renal manifestation CBGs are reasonably controlled - no change in tx plan today   CKD stage V requiring chronic dialysis Continue usual dialysis schedule per Nephrology - contemplating removal of HD cath 4/17  Chronic combined systolic (EF 45%) and grade 2diastolic congestive heart failure Volume management per dialysis - hemodynamically compensated at the present time  L tibial nail and ORIF - Feb 2014 Remains in cast - f/u w/ Dr. Dion Saucier post d/c   HEPATITIS C, HX OF    CEREBROVASCULAR ACCIDENT, HX OF   BENIGN PROSTATIC HYPERTROPHY  Code Status: FULL Family Communication: Spoke with patient - no family present Disposition Plan:  Pending improvement in hemoglobin and cessation of GI bleeding.  Consultants: GI Deboraha Sprang  Gen Surg Nephrology  Procedures: 4/8 - bedside anoscopy - mild internal hemorrhoids - bleeding from above hemorrhoids 4/9 - EGD - No bleeding or blood in the stomach - Minimal aspirin gastropathy characterized by tiny superficial ulceration in the antrum - No source of hematochezia evident  4/9 - Colo - No active bleeding - blood-tinged stool present - will plan to do a repeat, prepped colonoscopy 4/10  4/10 - prepped colo - Active GI bleed, most likely from above the ileal cecal valve. No gross colonic abnormalities identified apart from blood throughout the colonic lumen. 4/15 - upper endoscopy with small bowel enteroscopy - normal   Antibiotics: none  DVT prophylaxis: SCDs (cast to L LE)  HPI/Subjective: Patient denies any pain or active bleeding.  No acute issues reported overnight.  Objective: Blood pressure 157/81, pulse 90, temperature 99.8 F (37.7 C), temperature source Oral, resp. rate 18, height 5\' 9"  (1.753 m), weight 93.8 kg (206 lb 12.7 oz), SpO2 100.00%.  Intake/Output Summary (Last 24 hours) at 11/10/12 1618 Last data filed at 11/10/12 0944  Gross per 24 hour  Intake    360 ml  Output      0 ml  Net    360 ml   Exam: General: Alert, awake, in no acute distress. HEENT:  No bruits, no goiter. Heart: Regular rate and rhythm, without murmurs, rubs, gallops. Lungs: Clear to auscultation bilaterally. Abdomen: Soft, nontender, nondistended, positive bowel sounds. Neuro: answers questions appropriately.    Data Reviewed: Basic Metabolic Panel:  Recent Labs Lab 11/08/12 1240 11/10/12 1243  NA 126* 130*  K 5.8* 4.5  CL 92* 95*  CO2 23 27  GLUCOSE 134* 192*  BUN 31* 28*  CREATININE 8.66* 8.17*   CALCIUM 8.8 8.6  PHOS 4.7* 3.5   Liver Function Tests:  Recent Labs Lab 11/08/12 1240 11/10/12 1243  ALBUMIN 2.8* 2.5*   CBC:  Recent Labs Lab 11/07/12 0640 11/07/12 1932 11/08/12 0539 11/09/12 0546 11/10/12 0500  WBC 7.0 7.9 6.9 8.7 8.8  HGB 8.2* 7.6* 7.5* 8.5* 7.2*  HCT 23.8* 22.8* 21.7* 25.7* 21.6*  MCV 82.9 83.2 82.2 85.1 84.4  PLT 238 268 262 237 260   BNP (last 3 results)  Recent Labs  06/11/12 0916  PROBNP 6937.0*   CBG:  Recent Labs Lab 11/09/12 1222 11/09/12 1632 11/09/12 2031 11/10/12 0749 11/10/12 1204  GLUCAP 202* 185* 191* 161* 181*    Studies:  Recent x-ray studies have been reviewed in detail by the Attending Physician  Scheduled Meds:  Scheduled Meds: . brimonidine  1 drop Right Eye BID  . brinzolamide  1 drop Right Eye BID  . calcium acetate  1,334 mg Oral TID WC  . darbepoetin (ARANESP) injection - DIALYSIS  200 mcg Intravenous Q Tue-HD  . ferric gluconate (FERRLECIT/NULECIT) IV  125 mg Intravenous Q T,Th,Sa-HD  . insulin aspart  0-9 Units Subcutaneous TID WC  . insulin glargine  5 Units Subcutaneous Daily  . labetalol  100 mg Oral BID  . pantoprazole  40 mg Oral Daily  . polyethylene glycol  17 g Oral Daily  . Travoprost (BAK Free)  1 drop Both Eyes QHS   Continuous Infusions:    Time spent on care of this patient: 30 mins   Penny Pia, MD  [facility] Office  [phone] Pager [phone]  On-Call/Text Page:      [email or web address]      password [email or web address]  If 7PM-7AM, please contact night-coverage [email or web address] Password [email or web address] 11/10/2012, 4:18 PM   LOS: 9 days

## 2012-11-11 ENCOUNTER — Inpatient Hospital Stay (HOSPITAL_COMMUNITY): Payer: Medicare Other

## 2012-11-11 DIAGNOSIS — D638 Anemia in other chronic diseases classified elsewhere: Secondary | ICD-10-CM

## 2012-11-11 DIAGNOSIS — K219 Gastro-esophageal reflux disease without esophagitis: Secondary | ICD-10-CM

## 2012-11-11 LAB — CBC
MCH: 28.8 pg (ref 26.0–34.0)
MCHC: 33.3 g/dL (ref 30.0–36.0)
MCV: 86.3 fL (ref 78.0–100.0)
Platelets: 258 10*3/uL (ref 150–400)
RBC: 2.71 MIL/uL — ABNORMAL LOW (ref 4.22–5.81)

## 2012-11-11 MED ORDER — CHLORHEXIDINE GLUCONATE 4 % EX LIQD
CUTANEOUS | Status: AC
  Filled 2012-11-11: qty 30

## 2012-11-11 MED ORDER — SENNA 8.6 MG PO TABS
1.0000 | ORAL_TABLET | Freq: Every day | ORAL | Status: DC
  Administered 2012-11-11 – 2012-11-12 (×2): 8.6 mg via ORAL
  Filled 2012-11-11 (×2): qty 1

## 2012-11-11 NOTE — Progress Notes (Signed)
[facility] Progress Note  Subjective:   No c/o; no problems with HD yesterday  Objective Filed Vitals:   11/10/12 1629 11/10/12 1741 11/10/12 2200 11/11/12 0544  BP: 153/84 142/73 150/72 134/80  Pulse: 88 90 89 91  Temp: 99.9 F (37.7 C) 99.6 F (37.6 C) 99 F (37.2 C) 99.3 F (37.4 C)  TempSrc: Oral Oral Oral Oral  Resp:  20 18 18   Height:      Weight: 90.2 kg (198 lb 13.7 oz)  90.9 kg (200 lb 6.4 oz)   SpO2: 99% 100% 100% 100%   Physical Exam General: NAD sitting in bed Heart: RRR Lungs: no wheezes or rales Abdomen: soft NT Extremities: no right LE edema; cast on left LE Dialysis Access:  Left upper AVF + bruit (s/p removal of I-J cath)  Dialysis Orders: Center: [place] on TTS.  EDW 94.5 kg HD Bath 2K/2Ca Time 4 hrs Heparin 5000 U.  AVF @ LUA BFR 400 DFR 800 Hectorol 6 mcg IV/HD Epogen 10,000 Units IV/HD Venofer 100 mg until 4/22.  Assessment/Plan: 1. GI Bleed -no recent bleeding; s/p EGD and colonoscopy, endoscopy with small bowel enteroscopy 4/15 unrevealing; per GI/primary. Further evaluation if bleeding recurs 2. ESRD - TTS; catheter removed; orders written for Sat.; no heparin HD 3. Anemia - Hgb stable; on max ESA and repleting with IV Fe; s/p 2 units 4/8 and 1 units 4/12 4. Secondary hyperparathyroidism - Ca/P ok ; continue phoslo and hectorol 5. HTN/volume - lowering volume while here for better BP control - net UF 2.9 Thursday with post weight 90.9 6. Nutrition - ^ to high protein renal diet 7. Hx left tibial fx - s/p ORIF 2/14 8. Hx hep C +  Sheffield Slider, PA-C [facility] Beeper [phone] 11/11/2012,9:47 AM  LOS: 10 days   Patient seen and examined.  Agree with assessment and plan as above. Oscar Moselle  MD [phone] pgr    [phone] cell 11/11/2012, 11:37 AM   Additional Objective Labs: Basic Metabolic Panel:  Recent Labs Lab 11/08/12 1240 11/10/12 1243  NA 126* 130*  K 5.8* 4.5  CL 92* 95*  CO2 23 27   GLUCOSE 134* 192*  BUN 31* 28*  CREATININE 8.66* 8.17*  CALCIUM 8.8 8.6  PHOS 4.7* 3.5   Liver Function Tests:  Recent Labs Lab 11/08/12 1240 11/10/12 1243  ALBUMIN 2.8* 2.5*   CBC:  Recent Labs Lab 11/07/12 1932 11/08/12 0539 11/09/12 0546 11/10/12 0500 11/11/12 0637  WBC 7.9 6.9 8.7 8.8 8.7  HGB 7.6* 7.5* 8.5* 7.2* 7.8*  HCT 22.8* 21.7* 25.7* 21.6* 23.4*  MCV 83.2 82.2 85.1 84.4 86.3  PLT 268 262 237 260 258  CBG:  Recent Labs Lab 11/10/12 0749 11/10/12 1204 11/10/12 1738 11/10/12 2124 11/11/12 0727  GLUCAP 161* 181* 160* 209* 229*  Medications:   . brimonidine  1 drop Right Eye BID  . brinzolamide  1 drop Right Eye BID  . calcium acetate  1,334 mg Oral TID WC  . chlorhexidine      . darbepoetin (ARANESP) injection - DIALYSIS  200 mcg Intravenous Q Tue-HD  . ferric gluconate (FERRLECIT/NULECIT) IV  125 mg Intravenous Q T,Th,Sa-HD  . insulin aspart  0-9 Units Subcutaneous TID WC  . insulin glargine  5 Units Subcutaneous Daily  . labetalol  100 mg Oral BID  . pantoprazole  40 mg Oral Daily  . polyethylene glycol  17 g Oral Daily  . Travoprost (BAK Free)  1  drop Both Eyes QHS

## 2012-11-11 NOTE — Progress Notes (Signed)
[facility] Progress Note [place] TEAM 1 - Stepdown/ICU TEAM   Oscar Castillo AVW:[id number] DOB: Feb 23, 1951 DOA: 11/01/2012 PCP: Oliver Barre, MD  Brief narrative: 62 y.o. male w/ h/o ESRD on HD Tues, Thurs, Saturday who was recently admitted to [facility] due to GI bleed which was worked up with colonoscopy which was negative except for hemorrhoids. Patient discharged on 10/30/12 and presented again on 11/01/12 with the same complaint, which occurred after patient had a large BM according to family member.   In the ED patient was found to have a hgb of 7.7 compared to prior to d/c which was 8.8.   Assessment/Plan:  Acute blood loss anemia in addition to Anemia due to chronic illness Hemoglobin stable  Cont to monitor and transfuse again if drops below 7.  No active bleeding.  Will recheck cbc next am.  GI bleed GI is following - nondiagnostic EGD and unprepped colonoscopy 11/02/2012 -  pt noted to be bleeding on repeat colonoscopy 4/10 - unfortunately by the time a tagged RBC scan could be performed, he had stopped bleeding - capsule endoscopy revealed possible ulcer versus mass of small bowel - CT enterography unrevealing - endoscopy with  small bowel enteroscopy 4/15 unrevealing - no bleeding today - if bleeding recurs, will need nuc med bleeding scan again, with possible angio as f/u  - Recheck cbc next am. No active bleeding reported - Had low iron in the past but patient is actively getting iron replacement. - Had 2 units 4/8 and 1 unit 4/12  HYPERTENSION w/ severe concentric LVH Blood pressure controlled today   GERD Symptoms controlled   DM type I controlled with renal manifestation CBGs are reasonably controlled - no change in tx plan today   CKD stage V requiring chronic dialysis Continue usual dialysis schedule per Nephrology - contemplating removal of HD cath 4/17  Chronic combined systolic (EF 45%) and grade 2diastolic congestive heart failure Volume management per  dialysis - hemodynamically compensated at the present time  L tibial nail and ORIF - Feb 2014 Remains in cast - f/u w/ Dr. Dion Saucier post d/c   HEPATITIS C, HX OF   CEREBROVASCULAR ACCIDENT, HX OF   BENIGN PROSTATIC HYPERTROPHY  Code Status: FULL Family Communication: Spoke with patient - no family present Disposition Plan:  Pending improvement in hemoglobin and cessation of GI bleeding.  Consultants: GI Deboraha Sprang  Gen Surg Nephrology  Procedures: 4/8 - bedside anoscopy - mild internal hemorrhoids - bleeding from above hemorrhoids 4/9 - EGD - No bleeding or blood in the stomach - Minimal aspirin gastropathy characterized by tiny superficial ulceration in the antrum - No source of hematochezia evident  4/9 - Colo - No active bleeding - blood-tinged stool present - will plan to do a repeat, prepped colonoscopy 4/10  4/10 - prepped colo - Active GI bleed, most likely from above the ileal cecal valve. No gross colonic abnormalities identified apart from blood throughout the colonic lumen. 4/15 - upper endoscopy with small bowel enteroscopy - normal   Antibiotics: none  DVT prophylaxis: SCDs (cast to L LE)  HPI/Subjective: Patient denies any pain or active bleeding.  No acute issues reported overnight. Wife and patient are concerned about patient not having bowel movement.  They would not like to have bleeding with bowel movement like they have had in the past.   Objective: Blood pressure 161/84, pulse 88, temperature 98.6 F (37 C), temperature source Oral, resp. rate 18, height 5\' 9"  (1.753 m), weight  90.9 kg (200 lb 6.4 oz), SpO2 100.00%.  Intake/Output Summary (Last 24 hours) at 11/11/12 1716 Last data filed at 11/11/12 0900  Gross per 24 hour  Intake    840 ml  Output    100 ml  Net    740 ml   Exam: General: Alert, awake, in no acute distress. HEENT: No bruits, no goiter. Heart: Regular rate and rhythm, without murmurs, rubs, gallops. Lungs: Clear to auscultation  bilaterally. Abdomen: Soft, nontender, nondistended, positive bowel sounds. Neuro: answers questions appropriately.   Data Reviewed: Basic Metabolic Panel:  Recent Labs Lab 11/08/12 1240 11/10/12 1243  NA 126* 130*  K 5.8* 4.5  CL 92* 95*  CO2 23 27  GLUCOSE 134* 192*  BUN 31* 28*  CREATININE 8.66* 8.17*  CALCIUM 8.8 8.6  PHOS 4.7* 3.5   Liver Function Tests:  Recent Labs Lab 11/08/12 1240 11/10/12 1243  ALBUMIN 2.8* 2.5*   CBC:  Recent Labs Lab 11/07/12 1932 11/08/12 0539 11/09/12 0546 11/10/12 0500 11/11/12 0637  WBC 7.9 6.9 8.7 8.8 8.7  HGB 7.6* 7.5* 8.5* 7.2* 7.8*  HCT 22.8* 21.7* 25.7* 21.6* 23.4*  MCV 83.2 82.2 85.1 84.4 86.3  PLT 268 262 237 260 258   BNP (last 3 results)  Recent Labs  06/11/12 0916  PROBNP 6937.0*   CBG:  Recent Labs Lab 11/10/12 1738 11/10/12 2124 11/11/12 0727 11/11/12 1124 11/11/12 1636  GLUCAP 160* 209* 229* 151* 188*    Studies:  Recent x-ray studies have been reviewed in detail by the Attending Physician  Scheduled Meds:  Scheduled Meds: . brimonidine  1 drop Right Eye BID  . brinzolamide  1 drop Right Eye BID  . calcium acetate  1,334 mg Oral TID WC  . chlorhexidine      . darbepoetin (ARANESP) injection - DIALYSIS  200 mcg Intravenous Q Tue-HD  . ferric gluconate (FERRLECIT/NULECIT) IV  125 mg Intravenous Q T,Th,Sa-HD  . insulin aspart  0-9 Units Subcutaneous TID WC  . insulin glargine  5 Units Subcutaneous Daily  . labetalol  100 mg Oral BID  . pantoprazole  40 mg Oral Daily  . polyethylene glycol  17 g Oral Daily  . senna  1 tablet Oral Daily  . Travoprost (BAK Free)  1 drop Both Eyes QHS   Continuous Infusions:    Time spent on care of this patient: 30 mins   Oscar Pia, MD  [facility] Office  [phone] Pager [phone]  On-Call/Text Page:      [email or web address]      password [email or web address]  If 7PM-7AM, please contact night-coverage [email or web address] Password [facility] 11/11/2012, 5:16 PM    LOS: 10 days

## 2012-11-11 NOTE — Progress Notes (Signed)
Inpatient Diabetes Program Recommendations  AACE/ADA: New Consensus Statement on Inpatient Glycemic Control (2013)  Target Ranges:  Prepandial:   less than 140 mg/dL      Peak postprandial:   less than 180 mg/dL (1-2 hours)      Critically ill patients:  140 - 180 mg/dL   Results for EULISES, KIJOWSKI (MRN [id number]) as of 11/11/2012 10:55  Ref. Range 11/10/2012 07:49 11/10/2012 12:04 11/10/2012 17:38 11/10/2012 21:24 11/11/2012 07:27  Glucose-Capillary Latest Range: 70-99 mg/dL 829 (H) 562 (H) 130 (H) 209 (H) 229 (H)   Inpatient Diabetes Program Recommendations Insulin - Basal: Please consider increasing Lantus to 10 units daily.  Note: Patient has a history of diabetes and takes Lantus 15 units daily at home for diabetes management.  Currently, patient is ordered to receive Lantus 5 units daily and Novolog 0-9 units AC for inpatient glycemic control.  Blood glucose over the past 24 hours has ranged form 160-229 mg/dl.  Fasting blood glucose this morning was 229 mg/dl.  Please consider increasing Lantus to 10 units daily to improve glycemic control.  Will continue to follow.  Thanks, Orlando Penner, RN, BSN, CCRN Diabetes Coordinator Inpatient Diabetes Program [phone]

## 2012-11-11 NOTE — Evaluation (Signed)
Physical Therapy Evaluation Patient Details Name: Oscar Castillo MRN: [id number] DOB: 31-Dec-1950 Today's Date: 11/11/2012 Time: 4098-1191 PT Time Calculation (min): 20 min  PT Assessment / Plan / Recommendation Clinical Impression  Pt admitted for GI bleed but has h/o of L tib/fib fx 2/14 and remains in cast and L LE NWB. Pt with flat affect and non-compliance with L LE NWB. Pt with questionable history as well. Pt requires primary use of W/C and 24/7 supervision for safe d/c home. If patient does not have 24/7 assist available then pt will need SNF placement.    PT Assessment  Patient needs continued PT services    Follow Up Recommendations  Home health PT;SNF;Supervision/Assistance - 24 hour    Does the patient have the potential to tolerate intense rehabilitation      Barriers to Discharge        Equipment Recommendations  None recommended by PT    Recommendations for Other Services     Frequency Min 3X/week    Precautions / Restrictions Precautions Precautions: Fall Other Brace/Splint: cast on LLE Restrictions Weight Bearing Restrictions: Yes LLE Weight Bearing: Non weight bearing Other Position/Activity Restrictions: pt in L LE cast and NWB due to L tib/fib fx 2/14   Pertinent Vitals/Pain Pt denies pain      Mobility  Bed Mobility Bed Mobility: Supine to Sit;Sit to Supine Supine to Sit: 5: Supervision;With rails;HOB flat Sit to Supine: 5: Supervision;With rail;HOB flat Details for Bed Mobility Assistance: directional v/c's due to patient freq asking "what do you want me to do now?" Transfers Transfers: Sit to Stand;Stand to Sit Sit to Stand: 4: Min assist;With upper extremity assist;From bed Stand to Sit: 4: Min guard;With upper extremity assist;To bed Details for Transfer Assistance: pt non-compliant with L LE non-weight-bearing despite max v/c's Ambulation/Gait Ambulation/Gait Assistance: 4: Min assist Ambulation Distance (Feet): 20 Feet Assistive device:  Rolling walker Ambulation/Gait Assistance Details: amb limited due to pt constantly putting weight through L LE despite max v/c's to maintain L LE NWB. Pt verbally reports he can't put weight through his L LE however puts weight through his L LE. Pt unable to safely hop on R foot more than 2 consectutive times in a row. Gait Pattern: Step-to pattern Gait velocity: decreased Stairs: No    Exercises     PT Diagnosis: Difficulty walking  PT Problem List: Decreased activity tolerance;Decreased balance;Decreased mobility;Decreased safety awareness;Decreased knowledge of use of DME;Decreased knowledge of precautions PT Treatment Interventions: DME instruction;Gait training;Functional mobility training;Therapeutic activities;Therapeutic exercise;Balance training;Patient/family education   PT Goals Acute Rehab PT Goals PT Goal Formulation: With patient Time For Goal Achievement: 11/25/12 Potential to Achieve Goals: Good Pt will go Supine/Side to Sit: Independently;with HOB 0 degrees PT Goal: Supine/Side to Sit - Progress: Goal set today Pt will go Sit to Stand: with modified independence;with upper extremity assist (up to RW with L LE NWB) PT Goal: Sit to Stand - Progress: Goal set today Pt will Ambulate: 1 - 15 feet;with supervision;with rolling walker (with L LE NWB.) PT Goal: Ambulate - Progress: Goal set today Pt will Go Up / Down Stairs: 1-2 stairs;with min assist;with rolling walker (with L LE NWB.) PT Goal: Up/Down Stairs - Progress: Goal set today  Visit Information  Last PT Received On: 11/11/12 Assistance Needed: +1    Subjective Data  Subjective: Pt received supine in bed agreeable to PT. Patient Stated Goal: go home today   Prior Functioning  Home Living Lives With: Spouse Available Help at Discharge:  Family;Available 24 hours/day (stepson avail 24/7 per patient report) Type of Home: House Home Access: Stairs to enter [employer] of Steps: 1 Entrance  Stairs-Rails: None Home Layout: One level Bathroom Shower/Tub: Other (comment) (sponge bathes) Bathroom Toilet: Standard Bathroom Accessibility: Yes How Accessible: Accessible via walker Home Adaptive Equipment: Wheelchair - manual;Walker - standard;Bedside commode/3-in-1 Additional Comments: pt with questionable history, increased time required for report Prior Function Level of Independence: Needs assistance Needs Assistance: Bathing;Meal Prep;Toileting Bath: Moderate Toileting: Moderate Meal Prep: Total Able to Take Stairs?: Yes Driving: No Vocation: Retired Comments: pt unable to clearly state how he enter home Communication Communication:  (delayed response time/word finding difficulties) Dominant Hand: Right    Cognition  Cognition Arousal/Alertness: Awake/alert Behavior During Therapy: Flat affect Overall Cognitive Status:  (questionable history -pt reports he doesn't know the date) Area of Impairment: Orientation;Safety/judgement Orientation Level:  (pt only knows year not month or date) Memory: Decreased recall of precautions Following Commands: Follows one step commands inconsistently Safety/Judgement: Decreased awareness of safety General Comments: pt non-compliant with L LE NWB despite verbalizing he isn't allowed to put weight throught the L LE    Extremity/Trunk Assessment Right Upper Extremity Assessment RUE ROM/Strength/Tone: Within functional levels Left Upper Extremity Assessment LUE ROM/Strength/Tone: Within functional levels Left Lower Extremity Assessment LLE ROM/Strength/Tone: Deficits;Unable to fully assess LLE ROM/Strength/Tone Deficits:  (hip and knee appears WFL, ankle in cast) Trunk Assessment Trunk Assessment: Kyphotic   Balance Static Standing Balance Static Standing - Balance Support: No upper extremity supported Static Standing - Level of Assistance: 5: Stand by assistance Static Standing - Comment/# of Minutes: 2  End of Session PT - End  of Session Equipment Utilized During Treatment: Gait belt Activity Tolerance:  (limited by non-compliance with L LE NWB) Patient left: in bed;with call bell/phone within reach Nurse Communication: Mobility status  GP     Marcene Brawn 11/11/2012, 5:11 PM   Lewis Shock, PT, DPT Pager #: [phone] Office #: [phone]

## 2012-11-12 LAB — CBC
HCT: 22.7 % — ABNORMAL LOW (ref 39.0–52.0)
MCH: 27.9 pg (ref 26.0–34.0)
MCV: 85.7 fL (ref 78.0–100.0)
RBC: 2.65 MIL/uL — ABNORMAL LOW (ref 4.22–5.81)
WBC: 9 10*3/uL (ref 4.0–10.5)

## 2012-11-12 LAB — RENAL FUNCTION PANEL
Albumin: 2.5 g/dL — ABNORMAL LOW (ref 3.5–5.2)
BUN: 24 mg/dL — ABNORMAL HIGH (ref 6–23)
CO2: 28 mEq/L (ref 19–32)
Calcium: 8.9 mg/dL (ref 8.4–10.5)
Chloride: 94 mEq/L — ABNORMAL LOW (ref 96–112)
Creatinine, Ser: 7.08 mg/dL — ABNORMAL HIGH (ref 0.50–1.35)
GFR calc Af Amer: 9 mL/min — ABNORMAL LOW (ref >90)
GFR calc non Af Amer: 7 mL/min — ABNORMAL LOW (ref >90)
Glucose, Bld: 135 mg/dL — ABNORMAL HIGH (ref 70–99)
Phosphorus: 2.5 mg/dL (ref 2.3–4.6)
Potassium: 4.1 mEq/L (ref 3.5–5.1)
Sodium: 130 mEq/L — ABNORMAL LOW (ref 135–145)

## 2012-11-12 LAB — GLUCOSE, CAPILLARY
Glucose-Capillary: 146 mg/dL — ABNORMAL HIGH (ref 70–99)
Glucose-Capillary: 179 mg/dL — ABNORMAL HIGH (ref 70–99)

## 2012-11-12 MED ORDER — LIDOCAINE HCL (PF) 1 % IJ SOLN: 5.0000 mL | INTRAMUSCULAR | Status: DC | PRN

## 2012-11-12 MED ORDER — SODIUM CHLORIDE 0.9 % IV SOLN: 100.0000 mL | INTRAVENOUS | Status: DC | PRN

## 2012-11-12 MED ORDER — BISACODYL 5 MG PO TBEC
5.0000 mg | DELAYED_RELEASE_TABLET | Freq: Every day | ORAL | Status: DC | PRN
  Administered 2012-11-12: 5 mg via ORAL
  Filled 2012-11-12: qty 1

## 2012-11-12 MED ORDER — NEPRO/CARBSTEADY PO LIQD: 237.0000 mL | ORAL | Status: DC | PRN

## 2012-11-12 MED ORDER — ALTEPLASE 2 MG IJ SOLR: 2.0000 mg | Freq: Once | INTRAMUSCULAR | Status: DC | PRN

## 2012-11-12 MED ORDER — PENTAFLUOROPROP-TETRAFLUOROETH EX AERO: 1.0000 "application " | INHALATION_SPRAY | CUTANEOUS | Status: DC | PRN

## 2012-11-12 MED ORDER — HEPARIN SODIUM (PORCINE) 1000 UNIT/ML DIALYSIS: 1000.0000 [IU] | INTRAMUSCULAR | Status: DC | PRN

## 2012-11-12 MED ORDER — LIDOCAINE-PRILOCAINE 2.5-2.5 % EX CREA: 1.0000 "application " | TOPICAL_CREAM | CUTANEOUS | Status: DC | PRN

## 2012-11-12 NOTE — Procedures (Signed)
I was present at this dialysis session. I have reviewed the session itself and made appropriate changes.   Vinson Moselle, MD [employer] 11/12/2012, 10:07 AM

## 2012-11-12 NOTE — Progress Notes (Signed)
[facility] Progress Note [place] TEAM 1 - Stepdown/ICU TEAM   Oscar Castillo ZOX:[id number] DOB: 06-06-1951 DOA: 11/01/2012 PCP: Oliver Barre, MD  Brief narrative: 62 y.o. male w/ h/o ESRD on HD Tues, Thurs, Saturday who was recently admitted to [facility] due to GI bleed which was worked up with colonoscopy which was negative except for hemorrhoids. Patient discharged on 10/30/12 and presented again on 11/01/12 with the same complaint, which occurred after patient had a large BM according to family member.   In the ED patient was found to have a hgb of 7.7 compared to prior to d/c which was 8.8.   Assessment/Plan:  Acute blood loss anemia in addition to Anemia due to chronic illness Hemoglobin stable  Cont to monitor and transfuse again if drops below 7.  No active bleeding.  Will recheck cbc next am. - Patient on last admission had bleed with BM.  Reports he has not had a BM.  As such will administer dulcolax and miralax today.  GI bleed GI is following - nondiagnostic EGD and unprepped colonoscopy 11/02/2012 -  pt noted to be bleeding on repeat colonoscopy 4/10 - unfortunately by the time a tagged RBC scan could be performed, he had stopped bleeding - capsule endoscopy revealed possible ulcer versus mass of small bowel - CT enterography unrevealing - endoscopy with  small bowel enteroscopy 4/15 unrevealing - no bleeding today - if bleeding recurs, will need nuc med bleeding scan again, with possible angio as f/u  - Recheck cbc next am. No active bleeding reported - Had low iron in the past but patient is actively getting iron replacement with dialysis - Had 2 units 4/8 and 1 unit 4/12  HYPERTENSION w/ severe concentric LVH Blood pressure controlled today   GERD Symptoms controlled   DM type I controlled with renal manifestation CBGs are reasonably controlled - no change in tx plan today   CKD stage V requiring chronic dialysis Continue usual dialysis schedule per Nephrology -  contemplating removal of HD cath 4/17  Chronic combined systolic (EF 45%) and grade 2diastolic congestive heart failure Volume management per dialysis - hemodynamically compensated at the present time  L tibial nail and ORIF - Feb 2014 Remains in cast - f/u w/ Dr. Dion Saucier post d/c   HEPATITIS C, HX OF   CEREBROVASCULAR ACCIDENT, HX OF   BENIGN PROSTATIC HYPERTROPHY  Code Status: FULL Family Communication: Spoke with patient - no family present Disposition Plan:  Pending improvement in hemoglobin and cessation of GI bleeding.  Consultants: GI Deboraha Sprang  Gen Surg Nephrology  Procedures: 4/8 - bedside anoscopy - mild internal hemorrhoids - bleeding from above hemorrhoids 4/9 - EGD - No bleeding or blood in the stomach - Minimal aspirin gastropathy characterized by tiny superficial ulceration in the antrum - No source of hematochezia evident  4/9 - Colo - No active bleeding - blood-tinged stool present - will plan to do a repeat, prepped colonoscopy 4/10  4/10 - prepped colo - Active GI bleed, most likely from above the ileal cecal valve. No gross colonic abnormalities identified apart from blood throughout the colonic lumen. 4/15 - upper endoscopy with small bowel enteroscopy - normal   Antibiotics: none  DVT prophylaxis: SCDs (cast to L LE)  HPI/Subjective: Patient denies any pain or active bleeding.  No acute issues reported overnight. Wife and patient are concerned about patient not having bowel movement.  They would not like to have bleeding with bowel movement like they have had  in the past. He has no complaints currently  Objective: Blood pressure 134/82, pulse 92, temperature 98 F (36.7 C), temperature source Oral, resp. rate 20, height 5\' 9"  (1.753 m), weight 89.4 kg (197 lb 1.5 oz), SpO2 100.00%.  Intake/Output Summary (Last 24 hours) at 11/12/12 1418 Last data filed at 11/12/12 1148  Gross per 24 hour  Intake    480 ml  Output   3350 ml  Net  -2870 ml    Exam: General: Alert, awake, in no acute distress. HEENT: No bruits, no goiter. Heart: Regular rate and rhythm, without murmurs, rubs, gallops. Lungs: Clear to auscultation bilaterally. Abdomen: Soft, nontender, nondistended, positive bowel sounds. Neuro: answers questions appropriately.   Data Reviewed: Basic Metabolic Panel:  Recent Labs Lab 11/08/12 1240 11/10/12 1243 11/12/12 0700  NA 126* 130* 130*  K 5.8* 4.5 4.1  CL 92* 95* 94*  CO2 23 27 28   GLUCOSE 134* 192* 135*  BUN 31* 28* 24*  CREATININE 8.66* 8.17* 7.08*  CALCIUM 8.8 8.6 8.9  PHOS 4.7* 3.5 2.5   Liver Function Tests:  Recent Labs Lab 11/08/12 1240 11/10/12 1243 11/12/12 0700  ALBUMIN 2.8* 2.5* 2.5*   CBC:  Recent Labs Lab 11/08/12 0539 11/09/12 0546 11/10/12 0500 11/11/12 0637 11/12/12 0700  WBC 6.9 8.7 8.8 8.7 9.0  HGB 7.5* 8.5* 7.2* 7.8* 7.4*  HCT 21.7* 25.7* 21.6* 23.4* 22.7*  MCV 82.2 85.1 84.4 86.3 85.7  PLT 262 237 260 258 304   BNP (last 3 results)  Recent Labs  06/11/12 0916  PROBNP 6937.0*   CBG:  Recent Labs Lab 11/11/12 0727 11/11/12 1124 11/11/12 1636 11/11/12 2038 11/12/12 1143  GLUCAP 229* 151* 188* 144* 146*    Studies:  Recent x-ray studies have been reviewed in detail by the Attending Physician  Scheduled Meds:  Scheduled Meds: . brimonidine  1 drop Right Eye BID  . brinzolamide  1 drop Right Eye BID  . calcium acetate  1,334 mg Oral TID WC  . darbepoetin (ARANESP) injection - DIALYSIS  200 mcg Intravenous Q Tue-HD  . ferric gluconate (FERRLECIT/NULECIT) IV  125 mg Intravenous Q T,Th,Sa-HD  . insulin aspart  0-9 Units Subcutaneous TID WC  . insulin glargine  5 Units Subcutaneous Daily  . labetalol  100 mg Oral BID  . pantoprazole  40 mg Oral Daily  . polyethylene glycol  17 g Oral Daily  . Travoprost (BAK Free)  1 drop Both Eyes QHS   Continuous Infusions:    Time spent on care of this patient: 30 mins   Penny Pia, MD  [facility] Office  [phone] Pager [phone]  On-Call/Text Page:      [email or web address]      password [email or web address]  If 7PM-7AM, please contact night-coverage [email or web address] Password [email or web address] 11/12/2012, 2:18 PM   LOS: 11 days

## 2012-11-12 NOTE — Progress Notes (Signed)
[facility] Progress Note  Subjective:   No c/o; no problems with HD yesterday  Objective Filed Vitals:   11/12/12 0830 11/12/12 0900 11/12/12 0930 11/12/12 1000  BP: 152/82 165/84 144/80 139/77  Pulse: 87 87 87 87  Temp:      TempSrc:      Resp:      Height:      Weight:      SpO2:       Physical Exam General: NAD sitting in bed Heart: RRR Lungs: no wheezes or rales Abdomen: soft NT Extremities: no right LE edema; cast on left LE Dialysis Access:  Left upper AVF + bruit (s/p removal of I-J cath)  Dialysis Orders: Center: [place] on TTS.  EDW 94.5 kg HD Bath 2K/2Ca Time 4 hrs Heparin 5000 U.  AVF @ LUA BFR 400 DFR 800 Hectorol 6 mcg IV/HD Epogen 10,000 Units IV/HD Venofer 100 mg until 4/22.  Assessment/Plan: 1. GI Bleed -no recent bleeding; s/p EGD and colonoscopy, endoscopy with small bowel enteroscopy 4/15 unrevealing; per GI/primary. Further evaluation if bleeding recurs has had 4units prbc's total, last on 4/12, Hb stable mid 7's 2. ESRD - TTS; catheter removed; HD today.; no heparin HD due to GIB 3. Anemia - Hgb stable; on max ESA and repleting with IV Fe 4. Secondary hyperparathyroidism - Ca/P ok ; continue phoslo and hectorol 5. HTN/volume - lowering volume while here for better BP control - net UF 2.9 Thursday with post weight 90.9 6. Nutrition - ^ to high protein renal diet 7. Hx left tibial fx - s/p ORIF 2/14 8. Hx hep C +  Sheffield Slider, PA-C [facility] Beeper [phone] 11/12/2012,11:09 AM  LOS: 11 days   Patient seen and examined.  I agree with plan as above with additions as indicated. Oscar Moselle  MD [phone] pgr    [phone] cell 11/12/2012, 11:11 AM   Additional Objective Labs: Basic Metabolic Panel:  Recent Labs Lab 11/08/12 1240 11/10/12 1243 11/12/12 0700  NA 126* 130* 130*  K 5.8* 4.5 4.1  CL 92* 95* 94*  CO2 23 27 28   GLUCOSE 134* 192* 135*  BUN 31* 28* 24*  CREATININE 8.66* 8.17* 7.08*  CALCIUM  8.8 8.6 8.9  PHOS 4.7* 3.5 2.5   Liver Function Tests:  Recent Labs Lab 11/08/12 1240 11/10/12 1243 11/12/12 0700  ALBUMIN 2.8* 2.5* 2.5*   CBC:  Recent Labs Lab 11/08/12 0539 11/09/12 0546 11/10/12 0500 11/11/12 0637 11/12/12 0700  WBC 6.9 8.7 8.8 8.7 9.0  HGB 7.5* 8.5* 7.2* 7.8* 7.4*  HCT 21.7* 25.7* 21.6* 23.4* 22.7*  MCV 82.2 85.1 84.4 86.3 85.7  PLT 262 237 260 258 304  CBG:  Recent Labs Lab 11/10/12 2124 11/11/12 0727 11/11/12 1124 11/11/12 1636 11/11/12 2038  GLUCAP 209* 229* 151* 188* 144*  Medications:   . brimonidine  1 drop Right Eye BID  . brinzolamide  1 drop Right Eye BID  . calcium acetate  1,334 mg Oral TID WC  . darbepoetin (ARANESP) injection - DIALYSIS  200 mcg Intravenous Q Tue-HD  . ferric gluconate (FERRLECIT/NULECIT) IV  125 mg Intravenous Q T,Th,Sa-HD  . insulin aspart  0-9 Units Subcutaneous TID WC  . insulin glargine  5 Units Subcutaneous Daily  . labetalol  100 mg Oral BID  . pantoprazole  40 mg Oral Daily  . polyethylene glycol  17 g Oral Daily  . senna  1 tablet Oral Daily  . Travoprost (BAK Free)  1 drop Both Eyes QHS

## 2012-11-13 DIAGNOSIS — R935 Abnormal findings on diagnostic imaging of other abdominal regions, including retroperitoneum: Secondary | ICD-10-CM

## 2012-11-13 LAB — CBC
HCT: 24.7 % — ABNORMAL LOW (ref 39.0–52.0)
Hemoglobin: 7.9 g/dL — ABNORMAL LOW (ref 13.0–17.0)
MCV: 86.7 fL (ref 78.0–100.0)
RBC: 2.85 MIL/uL — ABNORMAL LOW (ref 4.22–5.81)
WBC: 10 10*3/uL (ref 4.0–10.5)

## 2012-11-13 LAB — GLUCOSE, CAPILLARY
Glucose-Capillary: 138 mg/dL — ABNORMAL HIGH (ref 70–99)
Glucose-Capillary: 235 mg/dL — ABNORMAL HIGH (ref 70–99)

## 2012-11-13 MED ORDER — LABETALOL HCL 100 MG PO TABS: 100.0000 mg | ORAL_TABLET | Freq: Two times a day (BID) | ORAL | Status: DC

## 2012-11-13 MED ORDER — INSULIN GLARGINE 100 UNIT/ML ~~LOC~~ SOLN: 5.0000 [IU] | Freq: Every day | SUBCUTANEOUS | Status: DC

## 2012-11-13 MED ORDER — DOCUSATE SODIUM 100 MG PO CAPS: 100.0000 mg | ORAL_CAPSULE | Freq: Two times a day (BID) | ORAL | Status: DC

## 2012-11-13 MED ORDER — PANTOPRAZOLE SODIUM 40 MG PO TBEC: 40.0000 mg | DELAYED_RELEASE_TABLET | Freq: Every day | ORAL | Status: DC

## 2012-11-13 NOTE — Progress Notes (Signed)
[name] Progress Note  Subjective:   No c/o  Objective Filed Vitals:   11/12/12 1749 11/12/12 1956 11/13/12 0622 11/13/12 0626  BP: 141/79 128/75 153/84 138/76  Pulse: 87 86 86 103  Temp: 98.6 F (37 C) 99.4 F (37.4 C) 99.3 F (37.4 C) 99.2 F (37.3 C)  TempSrc: Oral Oral Oral Oral  Resp: 20 20 20 20   Height:      Weight:  94.3 kg (207 lb 14.3 oz)    SpO2: 100% 100% 100% 97%   Physical Exam General: NAD sitting in recliner Heart: RRR Lungs: no rales or wheezes Abdomen: soft NT Extremities: no sign LE edema on right; cast on left Dialysis Access: Left upper AVF + bruit (s/p removal of I-J cath)   Dialysis Orders: Center: [place] on TTS.  EDW 94.5 kg HD Bath 2K/2Ca Time 4 hrs Heparin 5000 U.  AVF @ LUA BFR 400 DFR 800 Hectorol 6 mcg IV/HD Epogen 10,000 Units IV/HD Venofer 100 mg until 4/22.  Assessment/Plan:  1. GI Bleed -no recent bleeding; s/p EGD and colonoscopy, endoscopy with small bowel enteroscopy 4/15 unrevealing; per GI/primary. Further evaluation if bleeding recurs; has had 4 units total since admission, with the last being 4/12; will monitor Hgb q tmt x 3 and then weekly if stable 2. ESRD - TTS; catheter removed; orders written for Sat.; cotinue on no heparin HD for 2-4 weeks and then re-evaluate 3. Anemia - Hgb stable; on max ESA and repleting with IV Fe; Hgb 7.9 4/20- higher due to post HD status today. 4. Secondary hyperparathyroidism - Ca/P ok ; continue phoslo and hectorol 5. HTN/volume - lowering volume while here for better BP control - net UF 2.9 Thursday with post weight 90.9. Pre weight Sat was 92.4 with post weight of 89.4 (net UF 3.2) (Bed scale weight erroneous at 94.3.; Will be discharged on a lower dose of labetolol as well as lower EDW 6. Nutrition - ^ to high protein renal diet 7. Hx left tibial fx - s/p ORIF 2/14 8. Hx hep C +  Sheffield Slider, PA-C [facility] Beeper [phone] 11/13/2012,9:30 AM  LOS: 12 days    Patient seen and examined.  Agree with assessment and plan as above. Oscar Moselle  MD [phone] pgr    [phone] cell 11/13/2012, 12:46 PM   Additional Objective Labs: Basic Metabolic Panel:  Recent Labs Lab 11/08/12 1240 11/10/12 1243 11/12/12 0700  NA 126* 130* 130*  K 5.8* 4.5 4.1  CL 92* 95* 94*  CO2 23 27 28   GLUCOSE 134* 192* 135*  BUN 31* 28* 24*  CREATININE 8.66* 8.17* 7.08*  CALCIUM 8.8 8.6 8.9  PHOS 4.7* 3.5 2.5   Liver Function Tests:  Recent Labs Lab 11/08/12 1240 11/10/12 1243 11/12/12 0700  ALBUMIN 2.8* 2.5* 2.5*   CBC:  Recent Labs Lab 11/09/12 0546 11/10/12 0500 11/11/12 0637 11/12/12 0700 11/13/12 0445  WBC 8.7 8.8 8.7 9.0 10.0  HGB 8.5* 7.2* 7.8* 7.4* 7.9*  HCT 25.7* 21.6* 23.4* 22.7* 24.7*  MCV 85.1 84.4 86.3 85.7 86.7  PLT 237 260 258 304 265  CBG:  Recent Labs Lab 11/11/12 2038 11/12/12 1143 11/12/12 1622 11/12/12 1958 11/13/12 0729  GLUCAP 144* 146* 212* 179* 138*  No results found. Medications:   . brimonidine  1 drop Right Eye BID  . brinzolamide  1 drop Right Eye BID  . calcium acetate  1,334 mg Oral TID WC  . darbepoetin (ARANESP) injection - DIALYSIS  200 mcg Intravenous Q Tue-HD  . ferric gluconate (FERRLECIT/NULECIT) IV  125 mg Intravenous Q T,Th,Sa-HD  . insulin aspart  0-9 Units Subcutaneous TID WC  . insulin glargine  5 Units Subcutaneous Daily  . labetalol  100 mg Oral BID  . pantoprazole  40 mg Oral Daily  . polyethylene glycol  17 g Oral Daily  . Travoprost (BAK Free)  1 drop Both Eyes QHS

## 2012-11-13 NOTE — Discharge Summary (Addendum)
Physician Discharge Summary  Oscar Castillo ZOX:[id number] DOB: [birth year] DOA: 11/01/2012  PCP: Oliver Barre, MD  Admit date: 11/01/2012 Discharge date: 11/13/2012  Time spent: > 35 minutes  Recommendations for Outpatient Follow-up:  1. Please review D/C instruction 2. Will need h/h rechecked which can be done Tuesday while at HD 3. F/u with Dr. Sena Slate as per routine plans with your orthopaedic surgeon given your recent fracture  Discharge Diagnoses:  Principal Problem:   Acute blood loss anemia Active Problems:   HYPERTENSION   GERD   DM (diabetes mellitus) type I controlled with renal manifestation   CKD (chronic kidney disease) stage V requiring chronic dialysis   GI bleed   Chronic combined systolic (EF 45%) and grade 2diastolic congestive heart failure   Discharge Condition: stable  Diet recommendation: Renal/Diabetic diet  Filed Weights   11/12/12 0645 11/12/12 1059 11/12/12 1956  Weight: 92.4 kg (203 lb 11.3 oz) 89.4 kg (197 lb 1.5 oz) 94.3 kg (207 lb 14.3 oz)    History of present illness:  Brief narrative 62 y.o. male w/ h/o ESRD on HD Tues, Thurs, Saturday who was recently admitted to [facility] due to GI bleed which was worked up with colonoscopy which was negative except for hemorrhoids. Patient discharged on 10/30/12 and presented again on 11/01/12 with the same complaint, which occurred after patient had a large BM according to family member.  In the ED patient was found to have a hgb of 7.7 compared to prior to d/c which was 8.8.   Hospital Course:   Acute blood loss anemia in addition to Anemia due to chronic illness  Hemoglobin stable Cont to monitor and transfuse again if drops below 7.  No active bleeding. Will recheck cbc next am.  - Patient had no bleeding with bowel movement.  At this juncture hemoglobin is steady and patient has not had a GI bleed.   - Recommend having h/h rechecked during dialysis.  GI bleed  GI is following - nondiagnostic EGD and unprepped  colonoscopy 11/02/2012 - pt noted to be bleeding on repeat colonoscopy 4/10 - unfortunately by the time a tagged RBC scan could be performed, he had stopped bleeding - capsule endoscopy revealed possible ulcer versus mass of small bowel - CT enterography unrevealing - endoscopy with small bowel enteroscopy 4/15 unrevealing . - No active bleeding reported even with BM on day of d/c - Had low iron in the past but patient is actively getting iron replacement with dialysis  - Had 2 units 4/8 and 1 unit 4/12   HYPERTENSION w/ severe concentric LVH  Blood pressure controlled today . Given that nephrology is changing patients dry weight will plan on discharging on decreased dose of B blocker.  GERD  Symptoms controlled   DM type I controlled with renal manifestation  CBGs are reasonably controlled addendum: on Lantus 5 Units sq. Will therefore d/c on this regimen. Patient to continue logging his blood sugars for his PCP to further adjust his regimen pending values.  CKD stage V requiring chronic dialysis  Continue usual dialysis schedule per Nephrology.  Chronic combined systolic (EF 45%) and grade 2diastolic congestive heart failure  Volume management per dialysis - hemodynamically compensated at the present time   L tibial nail and ORIF - Feb 2014  Remains in cast - f/u w/ Dr. Dion Saucier post d/c   Procedures:  Please see above  Consultations:  Dr. Matthias Hughs with GI  Nephrology Dr. Arlean Hopping  Discharge Exam: Filed Vitals:   11/12/12  1956 11/13/12 0622 11/13/12 0626 11/13/12 0936  BP: 128/75 153/84 138/76 128/69  Pulse: 86 86 103 86  Temp: 99.4 F (37.4 C) 99.3 F (37.4 C) 99.2 F (37.3 C) 99.5 F (37.5 C)  TempSrc: Oral Oral Oral   Resp: 20 20 20 20   Height:      Weight: 94.3 kg (207 lb 14.3 oz)     SpO2: 100% 100% 97% 100%    General: Pt in NAD, Alert and Awake Cardiovascular: RRR, No MRG Respiratory: CTA BL, no wheezes Abdomen: NT, Nd  Discharge Instructions  Discharge  Orders   Future Appointments Provider Department Dept Phone   04/05/2013 10:45 AM Corwin Levins, MD [facility] [phone]   Future Orders Complete By Expires     Call MD for:  extreme fatigue  As directed     Call MD for:  persistant dizziness or light-headedness  As directed     Call MD for:  temperature >100.4  As directed     Diet - low sodium heart healthy  As directed     Discharge instructions  As directed     Comments:      Please be sure to follow up with Dr. Matthias Hughs at [phone].  For further recommendations regarding your recent GI related blood loss.  Also continue your routine hemodialysis sessions and while there nephrology will monitor your H/H and make further recommendations.    Increase activity slowly  As directed         Medication List    TAKE these medications       acetaminophen 325 MG tablet  Commonly known as:  TYLENOL  Take 650 mg by mouth every 6 (six) hours as needed for pain.     calcium acetate 667 MG capsule  Commonly known as:  PHOSLO  Take 2 capsules (1,334 mg total) by mouth 3 (three) times daily with meals.     docusate sodium 100 MG capsule  Commonly known as:  COLACE  Take 1 capsule (100 mg total) by mouth 2 (two) times daily.     insulin glargine 100 UNIT/ML injection  Commonly known as:  LANTUS SOLOSTAR  Inject 0.05 mLs (5 Units total) into the skin daily.     labetalol 100 MG tablet  Commonly known as:  NORMODYNE  Take 1 tablet (100 mg total) by mouth 2 (two) times daily.     pantoprazole 40 MG tablet  Commonly known as:  PROTONIX  Take 1 tablet (40 mg total) by mouth daily.     polyethylene glycol packet  Commonly known as:  MIRALAX / GLYCOLAX  Take 17 g by mouth daily.     SIMBRINZA 1-0.2 % Susp  Generic drug:  Brinzolamide-Brimonidine  Place 1 drop into the right eye 2 (two) times daily.     traMADol 50 MG tablet  Commonly known as:  ULTRAM  Take 50 mg by mouth every 8 (eight) hours as  needed for pain.     Travoprost (BAK Free) 0.004 % Soln ophthalmic solution  Commonly known as:  TRAVATAN  Place 1 drop into both eyes at bedtime.          The results of significant diagnostics from this hospitalization (including imaging, microbiology, ancillary and laboratory) are listed below for reference.    Significant Diagnostic Studies: Ct Abdomen Pelvis Wo Contrast  10/27/2012  *RADIOLOGY REPORT*  Clinical Data: Rectal bleeding  CT ABDOMEN AND PELVIS WITHOUT CONTRAST  Technique:  Multidetector CT  imaging of the abdomen and pelvis was performed following the standard protocol without intravenous contrast.  Comparison: CT 11/06/2009  Findings: Lung bases are clear.  Unenhanced images of the liver gallbladder and bile ducts are normal.  Pancreas and spleen are normal.  No kidney stone is identified.  There is no renal mass or obstruction.  No retroperitoneal adenopathy.  Soft tissue mass fills the rectum.  This is concerning for rectal carcinoma.  It is possible this represents stool.  Endoscopy is suggested for further evaluation.  No bowel obstruction.  Appendix is normal.  There is moderate stool in the colon.  Degenerative changes are seen throughout the lumbar spine with multiple Schmorl's nodes.  IMPRESSION: Soft tissue mass fills the rectum, concerning for a rectal carcinoma.  Without intravenous contrast, this is difficult to differentiate from stool.  Endoscopy suggested for further evaluation.   Original Report Authenticated By: Janeece Riggers, M.D.    Dg Abd 1 View  11/01/2012  *RADIOLOGY REPORT*  Clinical Data: Left lower quadrant abdominal pain.  Nausea and vomiting.  Rectal bleeding.  ABDOMEN - 1 VIEW  Comparison: CT abdomen and pelvis 10/27/2012.  Findings: Bowel gas pattern unremarkable without evidence of obstruction or significant ileus.  Gas and moderate stool burden throughout normal caliber colon.  Phleboliths in the pelvis.  No visible right urinary tract calculi.  Mild  degenerative changes involving the lumbar spine.  IMPRESSION: No acute abdominal abnormality.  I note that the recent CT demonstrated a rectal mass which likely explains the rectal bleeding.   Original Report Authenticated By: Hulan Saas, M.D.    Nm Gi Blood Loss  11/03/2012  *RADIOLOGY REPORT*  Clinical Data: Bright red blood per rectum.  NUCLEAR MEDICINE GASTROINTESTINAL BLEEDING STUDY  Technique:  Sequential abdominal images were obtained following intravenous administration of Tc-18m labeled red blood cells.  Radiopharmaceutical: CURIE ULTRATAG TECHNETIUM TC 40M- LABELED RED BLOOD CELLS IV KIT  Comparison: CT 10/27/2012  Findings: There is no radiotracer activity within the abdomen or pelvis to suggest active bleeding of tag red blood cells into the large or small bowel.  Physiologic activity noted within the blood pool and upper abdominal organs.  IMPRESSION: No scintigraphic evidence of active gastrointestinal bleeding.   Original Report Authenticated By: Genevive Bi, M.D.    Ir Removal Tun Cv Cath W/o Fl  11/11/2012  *RADIOLOGY REPORT*  Clinical Data: 62 year old male with right IJ approach tunneled hemodialysis catheter.  He now has a functioning left upper extremity arteriovenous fistula and no longer requires the presence of the permacatheter.  He presents for removal.  REMOVAL TUNNELED CENTRAL VENOUS CATHETER  Comparison: Most recent prior chest x-ray 10/28/2012  Procedure: Informed written consent was discussed and obtained prior to the procedure.  The right neck and chest were then prepped and draped in the standard sterile fashion are.  Local anesthesia was achieved by infiltration of 1% lidocaine.  Using a combination of blunt and sharp surgical dissection, the fabric cuff was successfully freed from the surrounding scar tissue.  The catheter was then removed with gentle traction.  The catheter was inspected and found be completely intact.  Hemostasis was obtained by manual  pressure.  A sterile, airtight bandage was then applied over the catheter exit site.  The patient tolerated the procedure well, there is no immediate complication.  IMPRESSION:  Removal of tunneled right IJ hemodialysis catheter.  Signed,  Sterling Big, MD Vascular & Interventional Radiologist [facility]   Original Report Authenticated By: Malachy Moan,  M.D.    Ct Entero Abd/pelvis W/cm  11/06/2012  *RADIOLOGY REPORT*  Clinical Data:  GI bleeding, evaluate for proximal small bowel tumor  CT ABDOMEN AND PELVIS WITH CONTRAST (CT ENTEROGRAPHY)  Technique:  Multidetector CT of the abdomen and pelvis during bolus administration of intravenous contrast. Negative oral contrast VoLumen was given.  Contrast: OMNIPAQUE IOHEXOL 300 MG/ML  SOLN  Comparison:  10/27/2012  Findings: Mild scarring versus atelectasis at the left lung base.  Liver, spleen, pancreas, and adrenal glands are within normal limits.  Gallbladder is underdistended.  No intrahepatic or extrahepatic ductal dilatation.  Bilateral renal atrophy.  No hydronephrosis.  No evidence of bowel obstruction.  Normal appendix.  No small bowel wall thickening or mass is seen.  Atherosclerotic calcifications of the abdominal aorta and branch vessels.  No abdominopelvic ascites.  No suspicious abdominopelvic lymphadenopathy.  Prostate is at the upper limits normal for size, measuring 4.9 cm in transverse dimension, and indenting the base of the bladder.  Bladder is mildly thick-walled although underdistended.  Endoscopic camera within the descending colon (coronal image 72).  IMPRESSION: No small bowel wall thickening or mass is seen.  Endoscopic camera in the descending colon.  No evidence of bowel obstruction.  Normal appendix.   Original Report Authenticated By: Charline Bills, M.D.    Dg Chest Port 1 View  10/28/2012  *RADIOLOGY REPORT*  Clinical Data: Confusion, shortness of breath.  PORTABLE CHEST - 1 VIEW  Comparison: 09/07/2012.   Findings: Trachea is midline.  Heart size stable.  Right IJ dialysis catheter tips project over the SVC.  Lungs are clear.  No pleural fluid.  IMPRESSION: No acute findings.   Original Report Authenticated By: Leanna Battles, M.D.     Microbiology: No results found for this or any previous visit (from the past 240 hour(s)).   Labs: Basic Metabolic Panel:  Recent Labs Lab 11/08/12 1240 11/10/12 1243 11/12/12 0700  NA 126* 130* 130*  K 5.8* 4.5 4.1  CL 92* 95* 94*  CO2 23 27 28   GLUCOSE 134* 192* 135*  BUN 31* 28* 24*  CREATININE 8.66* 8.17* 7.08*  CALCIUM 8.8 8.6 8.9  PHOS 4.7* 3.5 2.5   Liver Function Tests:  Recent Labs Lab 11/08/12 1240 11/10/12 1243 11/12/12 0700  ALBUMIN 2.8* 2.5* 2.5*   No results found for this basename: LIPASE, AMYLASE,  in the last 168 hours No results found for this basename: AMMONIA,  in the last 168 hours CBC:  Recent Labs Lab 11/09/12 0546 11/10/12 0500 11/11/12 0637 11/12/12 0700 11/13/12 0445  WBC 8.7 8.8 8.7 9.0 10.0  HGB 8.5* 7.2* 7.8* 7.4* 7.9*  HCT 25.7* 21.6* 23.4* 22.7* 24.7*  MCV 85.1 84.4 86.3 85.7 86.7  PLT 237 260 258 304 265   Cardiac Enzymes: No results found for this basename: CKTOTAL, CKMB, CKMBINDEX, TROPONINI,  in the last 168 hours BNP: BNP (last 3 results)  Recent Labs  06/11/12 0916  PROBNP 6937.0*   CBG:  Recent Labs Lab 11/11/12 2038 11/12/12 1143 11/12/12 1622 11/12/12 1958 11/13/12 0729  GLUCAP 144* 146* 212* 179* 138*       Signed:  Penny Pia  [facility] 11/13/2012, 9:52 AM

## 2012-11-13 NOTE — Progress Notes (Signed)
Patient discharge Home per Md order.  Discharge instructions reviewed with patient and family.  Copies of all forms given and explained. Patient/family voiced understanding of all instructions.  Discharge in no acute distress. Denvil Canning B. Angelys Yetman, RN BC, BSN, MSN 

## 2012-11-14 ENCOUNTER — Encounter (HOSPITAL_COMMUNITY): Payer: Self-pay

## 2012-11-15 DIAGNOSIS — N2581 Secondary hyperparathyroidism of renal origin: Secondary | ICD-10-CM | POA: Diagnosis not present

## 2012-11-15 DIAGNOSIS — Z992 Dependence on renal dialysis: Secondary | ICD-10-CM | POA: Diagnosis not present

## 2012-11-15 DIAGNOSIS — D509 Iron deficiency anemia, unspecified: Secondary | ICD-10-CM | POA: Diagnosis not present

## 2012-11-15 DIAGNOSIS — D631 Anemia in chronic kidney disease: Secondary | ICD-10-CM | POA: Diagnosis not present

## 2012-11-15 DIAGNOSIS — N186 End stage renal disease: Secondary | ICD-10-CM | POA: Diagnosis not present

## 2012-11-16 ENCOUNTER — Telehealth: Payer: Self-pay

## 2012-11-16 MED ORDER — PANTOPRAZOLE SODIUM 40 MG PO TBEC: 40.0000 mg | DELAYED_RELEASE_TABLET | Freq: Every day | ORAL | Status: DC

## 2012-11-16 NOTE — Telephone Encounter (Signed)
Patients wife requested refill on pantoprazole.  Sent to local pharmacy

## 2012-11-17 ENCOUNTER — Telehealth: Payer: Self-pay

## 2012-11-17 DIAGNOSIS — E1159 Type 2 diabetes mellitus with other circulatory complications: Secondary | ICD-10-CM | POA: Diagnosis not present

## 2012-11-17 DIAGNOSIS — N186 End stage renal disease: Secondary | ICD-10-CM | POA: Diagnosis not present

## 2012-11-17 DIAGNOSIS — N2581 Secondary hyperparathyroidism of renal origin: Secondary | ICD-10-CM | POA: Diagnosis not present

## 2012-11-17 DIAGNOSIS — D509 Iron deficiency anemia, unspecified: Secondary | ICD-10-CM | POA: Diagnosis not present

## 2012-11-17 DIAGNOSIS — Z992 Dependence on renal dialysis: Secondary | ICD-10-CM | POA: Diagnosis not present

## 2012-11-17 DIAGNOSIS — N039 Chronic nephritic syndrome with unspecified morphologic changes: Secondary | ICD-10-CM | POA: Diagnosis not present

## 2012-11-17 NOTE — Telephone Encounter (Signed)
Call back to pt's wife Darral Dash and she states [employer] will not cover generic protonix. It will need a prior authorization.

## 2012-11-17 NOTE — Telephone Encounter (Signed)
Prior authorization form for Pantoprazole 40 mg filled out and waiting on MD signature.

## 2012-11-17 NOTE — Telephone Encounter (Signed)
Pt's wife, Regnald Bowens, called from work regarding medication Protonix that was to be sent to the pharmacy. LM for her to return call. It was electronically routed to the pharmacy yesterday.

## 2012-11-19 DIAGNOSIS — N186 End stage renal disease: Secondary | ICD-10-CM | POA: Diagnosis not present

## 2012-11-19 DIAGNOSIS — D509 Iron deficiency anemia, unspecified: Secondary | ICD-10-CM | POA: Diagnosis not present

## 2012-11-19 DIAGNOSIS — Z992 Dependence on renal dialysis: Secondary | ICD-10-CM | POA: Diagnosis not present

## 2012-11-19 DIAGNOSIS — N2581 Secondary hyperparathyroidism of renal origin: Secondary | ICD-10-CM | POA: Diagnosis not present

## 2012-11-19 DIAGNOSIS — N039 Chronic nephritic syndrome with unspecified morphologic changes: Secondary | ICD-10-CM | POA: Diagnosis not present

## 2012-11-22 DIAGNOSIS — N2581 Secondary hyperparathyroidism of renal origin: Secondary | ICD-10-CM | POA: Diagnosis not present

## 2012-11-22 DIAGNOSIS — D509 Iron deficiency anemia, unspecified: Secondary | ICD-10-CM | POA: Diagnosis not present

## 2012-11-22 DIAGNOSIS — Z992 Dependence on renal dialysis: Secondary | ICD-10-CM | POA: Diagnosis not present

## 2012-11-22 DIAGNOSIS — N186 End stage renal disease: Secondary | ICD-10-CM | POA: Diagnosis not present

## 2012-11-22 DIAGNOSIS — N039 Chronic nephritic syndrome with unspecified morphologic changes: Secondary | ICD-10-CM | POA: Diagnosis not present

## 2012-11-24 DIAGNOSIS — N2581 Secondary hyperparathyroidism of renal origin: Secondary | ICD-10-CM | POA: Diagnosis not present

## 2012-11-24 DIAGNOSIS — D631 Anemia in chronic kidney disease: Secondary | ICD-10-CM | POA: Diagnosis not present

## 2012-11-24 DIAGNOSIS — H409 Unspecified glaucoma: Secondary | ICD-10-CM | POA: Diagnosis not present

## 2012-11-24 DIAGNOSIS — H4011X0 Primary open-angle glaucoma, stage unspecified: Secondary | ICD-10-CM | POA: Diagnosis not present

## 2012-11-24 DIAGNOSIS — N186 End stage renal disease: Secondary | ICD-10-CM | POA: Diagnosis not present

## 2012-11-30 DIAGNOSIS — S8290XD Unspecified fracture of unspecified lower leg, subsequent encounter for closed fracture with routine healing: Secondary | ICD-10-CM | POA: Diagnosis not present

## 2012-12-09 DIAGNOSIS — D62 Acute posthemorrhagic anemia: Secondary | ICD-10-CM | POA: Diagnosis not present

## 2012-12-22 DIAGNOSIS — S82409A Unspecified fracture of shaft of unspecified fibula, initial encounter for closed fracture: Secondary | ICD-10-CM | POA: Diagnosis not present

## 2012-12-22 DIAGNOSIS — Z4789 Encounter for other orthopedic aftercare: Secondary | ICD-10-CM | POA: Diagnosis not present

## 2012-12-24 DIAGNOSIS — N186 End stage renal disease: Secondary | ICD-10-CM | POA: Diagnosis not present

## 2012-12-27 DIAGNOSIS — Z992 Dependence on renal dialysis: Secondary | ICD-10-CM | POA: Diagnosis not present

## 2012-12-27 DIAGNOSIS — N186 End stage renal disease: Secondary | ICD-10-CM | POA: Diagnosis not present

## 2012-12-27 DIAGNOSIS — D509 Iron deficiency anemia, unspecified: Secondary | ICD-10-CM | POA: Diagnosis not present

## 2012-12-27 DIAGNOSIS — N2581 Secondary hyperparathyroidism of renal origin: Secondary | ICD-10-CM | POA: Diagnosis not present

## 2012-12-27 DIAGNOSIS — D631 Anemia in chronic kidney disease: Secondary | ICD-10-CM | POA: Diagnosis not present

## 2012-12-27 DIAGNOSIS — N039 Chronic nephritic syndrome with unspecified morphologic changes: Secondary | ICD-10-CM | POA: Diagnosis not present

## 2012-12-29 DIAGNOSIS — D509 Iron deficiency anemia, unspecified: Secondary | ICD-10-CM | POA: Diagnosis not present

## 2012-12-29 DIAGNOSIS — N186 End stage renal disease: Secondary | ICD-10-CM | POA: Diagnosis not present

## 2012-12-29 DIAGNOSIS — N2581 Secondary hyperparathyroidism of renal origin: Secondary | ICD-10-CM | POA: Diagnosis not present

## 2012-12-29 DIAGNOSIS — Z992 Dependence on renal dialysis: Secondary | ICD-10-CM | POA: Diagnosis not present

## 2012-12-29 DIAGNOSIS — D631 Anemia in chronic kidney disease: Secondary | ICD-10-CM | POA: Diagnosis not present

## 2012-12-31 DIAGNOSIS — N186 End stage renal disease: Secondary | ICD-10-CM | POA: Diagnosis not present

## 2012-12-31 DIAGNOSIS — D509 Iron deficiency anemia, unspecified: Secondary | ICD-10-CM | POA: Diagnosis not present

## 2012-12-31 DIAGNOSIS — N2581 Secondary hyperparathyroidism of renal origin: Secondary | ICD-10-CM | POA: Diagnosis not present

## 2012-12-31 DIAGNOSIS — D631 Anemia in chronic kidney disease: Secondary | ICD-10-CM | POA: Diagnosis not present

## 2012-12-31 DIAGNOSIS — Z992 Dependence on renal dialysis: Secondary | ICD-10-CM | POA: Diagnosis not present

## 2013-01-03 DIAGNOSIS — D631 Anemia in chronic kidney disease: Secondary | ICD-10-CM | POA: Diagnosis not present

## 2013-01-03 DIAGNOSIS — N186 End stage renal disease: Secondary | ICD-10-CM | POA: Diagnosis not present

## 2013-01-03 DIAGNOSIS — D509 Iron deficiency anemia, unspecified: Secondary | ICD-10-CM | POA: Diagnosis not present

## 2013-01-03 DIAGNOSIS — N2581 Secondary hyperparathyroidism of renal origin: Secondary | ICD-10-CM | POA: Diagnosis not present

## 2013-01-03 DIAGNOSIS — Z992 Dependence on renal dialysis: Secondary | ICD-10-CM | POA: Diagnosis not present

## 2013-01-05 DIAGNOSIS — N186 End stage renal disease: Secondary | ICD-10-CM | POA: Diagnosis not present

## 2013-01-05 DIAGNOSIS — D509 Iron deficiency anemia, unspecified: Secondary | ICD-10-CM | POA: Diagnosis not present

## 2013-01-05 DIAGNOSIS — N039 Chronic nephritic syndrome with unspecified morphologic changes: Secondary | ICD-10-CM | POA: Diagnosis not present

## 2013-01-05 DIAGNOSIS — Z992 Dependence on renal dialysis: Secondary | ICD-10-CM | POA: Diagnosis not present

## 2013-01-05 DIAGNOSIS — N2581 Secondary hyperparathyroidism of renal origin: Secondary | ICD-10-CM | POA: Diagnosis not present

## 2013-01-07 DIAGNOSIS — Z992 Dependence on renal dialysis: Secondary | ICD-10-CM | POA: Diagnosis not present

## 2013-01-07 DIAGNOSIS — N2581 Secondary hyperparathyroidism of renal origin: Secondary | ICD-10-CM | POA: Diagnosis not present

## 2013-01-07 DIAGNOSIS — D509 Iron deficiency anemia, unspecified: Secondary | ICD-10-CM | POA: Diagnosis not present

## 2013-01-07 DIAGNOSIS — D631 Anemia in chronic kidney disease: Secondary | ICD-10-CM | POA: Diagnosis not present

## 2013-01-07 DIAGNOSIS — N186 End stage renal disease: Secondary | ICD-10-CM | POA: Diagnosis not present

## 2013-01-10 DIAGNOSIS — N2581 Secondary hyperparathyroidism of renal origin: Secondary | ICD-10-CM | POA: Diagnosis not present

## 2013-01-10 DIAGNOSIS — N186 End stage renal disease: Secondary | ICD-10-CM | POA: Diagnosis not present

## 2013-01-10 DIAGNOSIS — D631 Anemia in chronic kidney disease: Secondary | ICD-10-CM | POA: Diagnosis not present

## 2013-01-10 DIAGNOSIS — Z992 Dependence on renal dialysis: Secondary | ICD-10-CM | POA: Diagnosis not present

## 2013-01-10 DIAGNOSIS — D509 Iron deficiency anemia, unspecified: Secondary | ICD-10-CM | POA: Diagnosis not present

## 2013-01-12 DIAGNOSIS — N186 End stage renal disease: Secondary | ICD-10-CM | POA: Diagnosis not present

## 2013-01-12 DIAGNOSIS — D509 Iron deficiency anemia, unspecified: Secondary | ICD-10-CM | POA: Diagnosis not present

## 2013-01-12 DIAGNOSIS — N039 Chronic nephritic syndrome with unspecified morphologic changes: Secondary | ICD-10-CM | POA: Diagnosis not present

## 2013-01-12 DIAGNOSIS — Z992 Dependence on renal dialysis: Secondary | ICD-10-CM | POA: Diagnosis not present

## 2013-01-12 DIAGNOSIS — N2581 Secondary hyperparathyroidism of renal origin: Secondary | ICD-10-CM | POA: Diagnosis not present

## 2013-01-14 DIAGNOSIS — N039 Chronic nephritic syndrome with unspecified morphologic changes: Secondary | ICD-10-CM | POA: Diagnosis not present

## 2013-01-14 DIAGNOSIS — D509 Iron deficiency anemia, unspecified: Secondary | ICD-10-CM | POA: Diagnosis not present

## 2013-01-14 DIAGNOSIS — Z992 Dependence on renal dialysis: Secondary | ICD-10-CM | POA: Diagnosis not present

## 2013-01-14 DIAGNOSIS — N186 End stage renal disease: Secondary | ICD-10-CM | POA: Diagnosis not present

## 2013-01-14 DIAGNOSIS — N2581 Secondary hyperparathyroidism of renal origin: Secondary | ICD-10-CM | POA: Diagnosis not present

## 2013-01-16 ENCOUNTER — Other Ambulatory Visit: Payer: Self-pay | Admitting: Internal Medicine

## 2013-01-17 DIAGNOSIS — N039 Chronic nephritic syndrome with unspecified morphologic changes: Secondary | ICD-10-CM | POA: Diagnosis not present

## 2013-01-17 DIAGNOSIS — D509 Iron deficiency anemia, unspecified: Secondary | ICD-10-CM | POA: Diagnosis not present

## 2013-01-17 DIAGNOSIS — Z992 Dependence on renal dialysis: Secondary | ICD-10-CM | POA: Diagnosis not present

## 2013-01-17 DIAGNOSIS — D631 Anemia in chronic kidney disease: Secondary | ICD-10-CM | POA: Diagnosis not present

## 2013-01-17 DIAGNOSIS — N2581 Secondary hyperparathyroidism of renal origin: Secondary | ICD-10-CM | POA: Diagnosis not present

## 2013-01-17 DIAGNOSIS — N186 End stage renal disease: Secondary | ICD-10-CM | POA: Diagnosis not present

## 2013-01-19 DIAGNOSIS — D509 Iron deficiency anemia, unspecified: Secondary | ICD-10-CM | POA: Diagnosis not present

## 2013-01-19 DIAGNOSIS — N2581 Secondary hyperparathyroidism of renal origin: Secondary | ICD-10-CM | POA: Diagnosis not present

## 2013-01-19 DIAGNOSIS — N186 End stage renal disease: Secondary | ICD-10-CM | POA: Diagnosis not present

## 2013-01-19 DIAGNOSIS — N039 Chronic nephritic syndrome with unspecified morphologic changes: Secondary | ICD-10-CM | POA: Diagnosis not present

## 2013-01-19 DIAGNOSIS — H4011X0 Primary open-angle glaucoma, stage unspecified: Secondary | ICD-10-CM | POA: Diagnosis not present

## 2013-01-19 DIAGNOSIS — Z992 Dependence on renal dialysis: Secondary | ICD-10-CM | POA: Diagnosis not present

## 2013-01-19 DIAGNOSIS — H409 Unspecified glaucoma: Secondary | ICD-10-CM | POA: Diagnosis not present

## 2013-01-21 DIAGNOSIS — D509 Iron deficiency anemia, unspecified: Secondary | ICD-10-CM | POA: Diagnosis not present

## 2013-01-21 DIAGNOSIS — N186 End stage renal disease: Secondary | ICD-10-CM | POA: Diagnosis not present

## 2013-01-21 DIAGNOSIS — Z992 Dependence on renal dialysis: Secondary | ICD-10-CM | POA: Diagnosis not present

## 2013-01-21 DIAGNOSIS — N2581 Secondary hyperparathyroidism of renal origin: Secondary | ICD-10-CM | POA: Diagnosis not present

## 2013-01-21 DIAGNOSIS — D631 Anemia in chronic kidney disease: Secondary | ICD-10-CM | POA: Diagnosis not present

## 2013-01-23 DIAGNOSIS — N186 End stage renal disease: Secondary | ICD-10-CM | POA: Diagnosis not present

## 2013-01-24 DIAGNOSIS — Z23 Encounter for immunization: Secondary | ICD-10-CM | POA: Diagnosis not present

## 2013-01-24 DIAGNOSIS — N186 End stage renal disease: Secondary | ICD-10-CM | POA: Diagnosis not present

## 2013-01-24 DIAGNOSIS — D509 Iron deficiency anemia, unspecified: Secondary | ICD-10-CM | POA: Diagnosis not present

## 2013-01-24 DIAGNOSIS — D631 Anemia in chronic kidney disease: Secondary | ICD-10-CM | POA: Diagnosis not present

## 2013-01-24 DIAGNOSIS — Z992 Dependence on renal dialysis: Secondary | ICD-10-CM | POA: Diagnosis not present

## 2013-01-24 DIAGNOSIS — N2581 Secondary hyperparathyroidism of renal origin: Secondary | ICD-10-CM | POA: Diagnosis not present

## 2013-02-01 DIAGNOSIS — S82209A Unspecified fracture of shaft of unspecified tibia, initial encounter for closed fracture: Secondary | ICD-10-CM | POA: Diagnosis not present

## 2013-02-03 DIAGNOSIS — T82898A Other specified complication of vascular prosthetic devices, implants and grafts, initial encounter: Secondary | ICD-10-CM | POA: Diagnosis not present

## 2013-02-03 DIAGNOSIS — I871 Compression of vein: Secondary | ICD-10-CM | POA: Diagnosis not present

## 2013-02-03 DIAGNOSIS — N186 End stage renal disease: Secondary | ICD-10-CM | POA: Diagnosis not present

## 2013-02-16 DIAGNOSIS — E1159 Type 2 diabetes mellitus with other circulatory complications: Secondary | ICD-10-CM | POA: Diagnosis not present

## 2013-02-16 DIAGNOSIS — N186 End stage renal disease: Secondary | ICD-10-CM | POA: Diagnosis not present

## 2013-02-17 ENCOUNTER — Ambulatory Visit: Payer: Medicare Other | Admitting: Internal Medicine

## 2013-02-20 ENCOUNTER — Ambulatory Visit (INDEPENDENT_AMBULATORY_CARE_PROVIDER_SITE_OTHER): Payer: Medicare Other | Admitting: Internal Medicine

## 2013-02-20 ENCOUNTER — Encounter: Payer: Self-pay | Admitting: Internal Medicine

## 2013-02-20 VITALS — BP 142/80 | HR 83 | Temp 98.8°F | Ht 70.0 in | Wt 200.0 lb

## 2013-02-20 DIAGNOSIS — G459 Transient cerebral ischemic attack, unspecified: Secondary | ICD-10-CM | POA: Diagnosis not present

## 2013-02-20 DIAGNOSIS — F329 Major depressive disorder, single episode, unspecified: Secondary | ICD-10-CM

## 2013-02-20 DIAGNOSIS — E1029 Type 1 diabetes mellitus with other diabetic kidney complication: Secondary | ICD-10-CM | POA: Diagnosis not present

## 2013-02-20 DIAGNOSIS — I639 Cerebral infarction, unspecified: Secondary | ICD-10-CM | POA: Insufficient documentation

## 2013-02-20 DIAGNOSIS — I10 Essential (primary) hypertension: Secondary | ICD-10-CM

## 2013-02-20 DIAGNOSIS — N058 Unspecified nephritic syndrome with other morphologic changes: Secondary | ICD-10-CM

## 2013-02-20 MED ORDER — ASPIRIN EC 81 MG PO TBEC: 81.0000 mg | DELAYED_RELEASE_TABLET | Freq: Every day | ORAL | Status: DC

## 2013-02-20 NOTE — Assessment & Plan Note (Signed)
stable overall by history and exam, recent data reviewed with pt, and pt to continue medical treatment as before,  to f/u any worsening symptoms or concerns BP Readings from Last 3 Encounters:  02/20/13 142/80  11/13/12 128/69  11/13/12 128/69

## 2013-02-20 NOTE — Progress Notes (Signed)
Subjective:    Patient ID: Oscar Castillo, male    DOB: Mar 24, 1951, 62 y.o.   MRN: [id number]  HPI  Here with wife, who states pt with symptoms x 2 mo since memorial day weekend, refuesed ER eval all this time, adamant regarding being left alone and angry about being here today, still with dialysis Tues/thur/sat and hates the tx. Has hx of stroke, also fell with broken leg feb 2014. Pt states he was lonely and called her at work today, hoping she will retire soon, but wife states ? More confused today, thought he was outside when called her at work, but she is not sure, as she found him in the house when she got home.  He could recognize her yesterday. Has had intermittent slurred speech, "twisted mouth" since late may several episodes, last one yesterday AM.  Seeing Dr Dion Saucier for ortho.   Pt denies fever, wt loss, night sweats, loss of appetite, or other constitutional symptoms. Off ASA since GI bleeding April 2014, not re-started since then.  Denies worsening depressive symptoms, suicidal ideation, or panic though wife thinks maybe more depressed recently. Last a1c 7.8 in feb 2014 -  Pt denies polydipsia, polyuria, or low sugar symptoms such as weakness or confusion improved with po intake.  Pt states overall good compliance with meds, trying to follow lower cholesterol, diabetic diet, wt overall stable  Past Medical History  Diagnosis Date  . ALLERGIC RHINITIS 05/05/2007  . BACK PAIN, LUMBAR, CHRONIC 08/06/2009  . BENIGN PROSTATIC HYPERTROPHY 08/01/2010  . CEREBROVASCULAR ACCIDENT, HX OF 08/06/2009  . CHOLELITHIASIS 08/01/2010  . CONGESTIVE HEART FAILURE 03/18/2009  . DEPRESSION 03/18/2009  . DIABETES MELLITUS, TYPE II 03/25/2007  . ERECTILE DYSFUNCTION 03/25/2007  . GERD 03/25/2007  . HEPATITIS C, HX OF 03/25/2007  . HYPERTENSION 03/25/2007  . Morbid obesity 03/25/2007  . NEPHROLITHIASIS, HX OF 03/18/2009  . ESRD on hemodialysis 09/08/2012    ESRD due to DM/HTN. Started dialysis in November 2013. Has LUA  AVF placed Jun 14 2012 which is maturing as of Feb 2014.   [name] Renal insufficiency    Past Surgical History  Procedure Laterality Date  . Nephrectomy      partial RR  . Av fistula placement  06/14/2012    Procedure: ARTERIOVENOUS (AV) FISTULA CREATION;  Surgeon: Chuck Hint, MD;  Location: [facility] OR;  Service: Vascular;  Laterality: Left;  Left basilic vein transposition with fistula.  . Tibia im nail insertion Left 09/09/2012    Procedure: INTRAMEDULLARY (IM) NAIL TIBIAL;  Surgeon: Eulas Post, MD;  Location: [facility] OR;  Service: Orthopedics;  Laterality: Left;  left tibial nail and open reduction internal fixation left fibula fracture  . Orif fibula fracture Left 09/09/2012    Procedure: OPEN REDUCTION INTERNAL FIXATION (ORIF) FIBULA FRACTURE;  Surgeon: Eulas Post, MD;  Location: [facility] OR;  Service: Orthopedics;  Laterality: Left;  . Colonoscopy N/A 10/28/2012    Procedure: COLONOSCOPY;  Surgeon: Petra Kuba, MD;  Location: [facility] ENDOSCOPY;  Service: Endoscopy;  Laterality: N/A;  . Esophagogastroduodenoscopy N/A 11/02/2012    Procedure: ESOPHAGOGASTRODUODENOSCOPY (EGD);  Surgeon: Florencia Reasons, MD;  Location: [facility] ENDOSCOPY;  Service: Endoscopy;  Laterality: N/A;  . Colonoscopy N/A 11/02/2012    Procedure: COLONOSCOPY;  Surgeon: Florencia Reasons, MD;  Location: [facility] ENDOSCOPY;  Service: Endoscopy;  Laterality: N/A;  . Colonoscopy N/A 11/03/2012    Procedure: COLONOSCOPY;  Surgeon: Florencia Reasons, MD;  Location: [facility] ENDOSCOPY;  Service: Endoscopy;  Laterality: N/A;  .  Givens capsule study N/A 11/04/2012    Procedure: GIVENS CAPSULE STUDY;  Surgeon: Florencia Reasons, MD;  Location: [facility] ENDOSCOPY;  Service: Endoscopy;  Laterality: N/A;  . Enteroscopy N/A 11/08/2012    Procedure: ENTEROSCOPY;  Surgeon: Graylin Shiver, MD;  Location: [facility] ENDOSCOPY;  Service: Endoscopy;  Laterality: N/A;    reports that he has never smoked. He does not have any smokeless tobacco history on file. He reports that he does  not drink alcohol or use illicit drugs. family history includes Coronary artery disease in his other and Diabetes in his other. No Known Allergies Current Outpatient Prescriptions on File Prior to Visit  Medication Sig Dispense Refill  . acetaminophen (TYLENOL) 325 MG tablet Take 650 mg by mouth every 6 (six) hours as needed for pain.      [name] amLODipine (NORVASC) 10 MG tablet TAKE ONE TABLET BY MOUTH ONE TIME DAILY.  90 tablet  2  . Brinzolamide-Brimonidine (SIMBRINZA) 1-0.2 % SUSP Place 1 drop into the right eye 2 (two) times daily.      . calcium acetate (PHOSLO) 667 MG capsule Take 2 capsules (1,334 mg total) by mouth 3 (three) times daily with meals.  180 capsule  0  . docusate sodium (COLACE) 100 MG capsule Take 1 capsule (100 mg total) by mouth 2 (two) times daily.  30 capsule  0  . insulin glargine (LANTUS SOLOSTAR) 100 UNIT/ML injection Inject 0.05 mLs (5 Units total) into the skin daily.  5 pen  PRN  . labetalol (NORMODYNE) 100 MG tablet Take 1 tablet (100 mg total) by mouth 2 (two) times daily.  60 tablet  0  . pantoprazole (PROTONIX) 40 MG tablet Take 1 tablet (40 mg total) by mouth daily.  30 tablet  11  . polyethylene glycol (MIRALAX / GLYCOLAX) packet Take 17 g by mouth daily.  14 each  0  . traMADol (ULTRAM) 50 MG tablet Take 50 mg by mouth every 8 (eight) hours as needed for pain.      . Travoprost, BAK Free, (TRAVATAN) 0.004 % SOLN ophthalmic solution Place 1 drop into both eyes at bedtime.  1 Bottle  0   No current facility-administered medications on file prior to visit.   Review of Systems  Constitutional: Negative for unexpected weight change, or unusual diaphoresis  HENT: Negative for tinnitus.   Eyes: Negative for photophobia and visual disturbance.  Respiratory: Negative for choking and stridor.   Gastrointestinal: Negative for vomiting and blood in stool.  Genitourinary: Negative for hematuria and decreased urine volume.  Musculoskeletal: Negative for acute joint  swelling Skin: Negative for color change and wound.  Neurological: Negative for tremors and numbness other than noted  Psychiatric/Behavioral: Negative for decreased concentration or  hyperactivity.       Objective:   Physical Exam BP 142/80  Pulse 83  Temp(Src) 98.8 F (37.1 C) (Oral)  Ht 5\' 10"  (1.778 m)  Wt 200 lb (90.719 kg)  BMI 28.7 kg/m2  SpO2 99% VS noted, chronic ill Constitutional: Pt appears well-developed and well-nourished.  HENT: Head: NCAT.  Right Ear: External ear normal.  Left Ear: External ear normal.  Eyes: Conjunctivae and EOM are normal. Pupils are equal, round, and reactive to light.  Neck: Normal range of motion. Neck supple.  Cardiovascular: Normal rate and regular rhythm.   Pulmonary/Chest: Effort normal and breath sounds normal.  Abd:  Soft, NT, non-distended, + BS Neurological: Pt is alert. ? Left upper lid ptosis mild, o/w cn 2-12 intact, motor  5/5, walks with walker today Skin: Skin is warm. No erythema.  Psychiatric: Pt behavior is normal. Thought content normal.     Assessment & Plan:

## 2013-02-20 NOTE — Patient Instructions (Addendum)
Please take all new medication as prescribed - the Aspirin 81 mg OTC , 1 per day, Enteric coated only Please continue all other medications as before, and refills have been done if requested. Please keep your appointments with your specialists as you have planned You will be contacted regarding the referral for: MRI for head (to see [facility] now) You will be contacted by phone if any changes need to be made immediately.  Otherwise, you will receive a letter about your results with an explanation, but please check with MyChart first.  Please remember to sign up for My Chart if you have not done so, as this will be important to you in the future with finding out test results, communicating by private email, and scheduling acute appointments online when needed.  Please return in 6 months, or sooner if needed

## 2013-02-20 NOTE — Assessment & Plan Note (Signed)
prob mild, pt declines further eval or tx

## 2013-02-20 NOTE — Assessment & Plan Note (Addendum)
Clinical dx, symptoms now apparently resolved, several in past month per wife, will need re-start asa 81 mg per day, MRI head,  to f/u any worsening symptoms or concerns  Note:  Total time for pt hx, exam, review of record with pt in the room, determination of diagnoses and plan for further eval and tx is > 40 min, with over 50% spent in coordination and counseling of patient

## 2013-02-20 NOTE — Assessment & Plan Note (Signed)
For a1c today, o/ stable

## 2013-02-23 DIAGNOSIS — H4011X0 Primary open-angle glaucoma, stage unspecified: Secondary | ICD-10-CM | POA: Diagnosis not present

## 2013-02-23 DIAGNOSIS — N186 End stage renal disease: Secondary | ICD-10-CM | POA: Diagnosis not present

## 2013-02-23 DIAGNOSIS — E119 Type 2 diabetes mellitus without complications: Secondary | ICD-10-CM | POA: Diagnosis not present

## 2013-02-23 DIAGNOSIS — H409 Unspecified glaucoma: Secondary | ICD-10-CM | POA: Diagnosis not present

## 2013-02-25 DIAGNOSIS — D631 Anemia in chronic kidney disease: Secondary | ICD-10-CM | POA: Diagnosis not present

## 2013-02-25 DIAGNOSIS — D509 Iron deficiency anemia, unspecified: Secondary | ICD-10-CM | POA: Diagnosis not present

## 2013-02-25 DIAGNOSIS — N2581 Secondary hyperparathyroidism of renal origin: Secondary | ICD-10-CM | POA: Diagnosis not present

## 2013-02-25 DIAGNOSIS — Z992 Dependence on renal dialysis: Secondary | ICD-10-CM | POA: Diagnosis not present

## 2013-02-25 DIAGNOSIS — N039 Chronic nephritic syndrome with unspecified morphologic changes: Secondary | ICD-10-CM | POA: Diagnosis not present

## 2013-02-25 DIAGNOSIS — N186 End stage renal disease: Secondary | ICD-10-CM | POA: Diagnosis not present

## 2013-02-26 ENCOUNTER — Ambulatory Visit
Admission: RE | Admit: 2013-02-26 | Discharge: 2013-02-26 | Disposition: A | Discharge status: Home / Self Care | Payer: Medicare Other | Source: Ambulatory Visit | Attending: Internal Medicine | Admitting: Internal Medicine

## 2013-02-26 DIAGNOSIS — G459 Transient cerebral ischemic attack, unspecified: Secondary | ICD-10-CM

## 2013-02-26 DIAGNOSIS — I6350 Cerebral infarction due to unspecified occlusion or stenosis of unspecified cerebral artery: Secondary | ICD-10-CM | POA: Diagnosis not present

## 2013-02-28 DIAGNOSIS — D509 Iron deficiency anemia, unspecified: Secondary | ICD-10-CM | POA: Diagnosis not present

## 2013-02-28 DIAGNOSIS — N186 End stage renal disease: Secondary | ICD-10-CM | POA: Diagnosis not present

## 2013-02-28 DIAGNOSIS — N039 Chronic nephritic syndrome with unspecified morphologic changes: Secondary | ICD-10-CM | POA: Diagnosis not present

## 2013-02-28 DIAGNOSIS — D631 Anemia in chronic kidney disease: Secondary | ICD-10-CM | POA: Diagnosis not present

## 2013-02-28 DIAGNOSIS — N2581 Secondary hyperparathyroidism of renal origin: Secondary | ICD-10-CM | POA: Diagnosis not present

## 2013-02-28 DIAGNOSIS — Z992 Dependence on renal dialysis: Secondary | ICD-10-CM | POA: Diagnosis not present

## 2013-03-02 DIAGNOSIS — N2581 Secondary hyperparathyroidism of renal origin: Secondary | ICD-10-CM | POA: Diagnosis not present

## 2013-03-02 DIAGNOSIS — N186 End stage renal disease: Secondary | ICD-10-CM | POA: Diagnosis not present

## 2013-03-02 DIAGNOSIS — Z992 Dependence on renal dialysis: Secondary | ICD-10-CM | POA: Diagnosis not present

## 2013-03-02 DIAGNOSIS — D509 Iron deficiency anemia, unspecified: Secondary | ICD-10-CM | POA: Diagnosis not present

## 2013-03-02 DIAGNOSIS — N039 Chronic nephritic syndrome with unspecified morphologic changes: Secondary | ICD-10-CM | POA: Diagnosis not present

## 2013-03-04 DIAGNOSIS — N186 End stage renal disease: Secondary | ICD-10-CM | POA: Diagnosis not present

## 2013-03-04 DIAGNOSIS — Z992 Dependence on renal dialysis: Secondary | ICD-10-CM | POA: Diagnosis not present

## 2013-03-04 DIAGNOSIS — N2581 Secondary hyperparathyroidism of renal origin: Secondary | ICD-10-CM | POA: Diagnosis not present

## 2013-03-04 DIAGNOSIS — D631 Anemia in chronic kidney disease: Secondary | ICD-10-CM | POA: Diagnosis not present

## 2013-03-04 DIAGNOSIS — D509 Iron deficiency anemia, unspecified: Secondary | ICD-10-CM | POA: Diagnosis not present

## 2013-03-07 DIAGNOSIS — N039 Chronic nephritic syndrome with unspecified morphologic changes: Secondary | ICD-10-CM | POA: Diagnosis not present

## 2013-03-07 DIAGNOSIS — N2581 Secondary hyperparathyroidism of renal origin: Secondary | ICD-10-CM | POA: Diagnosis not present

## 2013-03-07 DIAGNOSIS — N186 End stage renal disease: Secondary | ICD-10-CM | POA: Diagnosis not present

## 2013-03-07 DIAGNOSIS — D509 Iron deficiency anemia, unspecified: Secondary | ICD-10-CM | POA: Diagnosis not present

## 2013-03-07 DIAGNOSIS — Z992 Dependence on renal dialysis: Secondary | ICD-10-CM | POA: Diagnosis not present

## 2013-03-09 DIAGNOSIS — N2581 Secondary hyperparathyroidism of renal origin: Secondary | ICD-10-CM | POA: Diagnosis not present

## 2013-03-09 DIAGNOSIS — D509 Iron deficiency anemia, unspecified: Secondary | ICD-10-CM | POA: Diagnosis not present

## 2013-03-09 DIAGNOSIS — D631 Anemia in chronic kidney disease: Secondary | ICD-10-CM | POA: Diagnosis not present

## 2013-03-09 DIAGNOSIS — N186 End stage renal disease: Secondary | ICD-10-CM | POA: Diagnosis not present

## 2013-03-09 DIAGNOSIS — Z992 Dependence on renal dialysis: Secondary | ICD-10-CM | POA: Diagnosis not present

## 2013-03-11 DIAGNOSIS — D509 Iron deficiency anemia, unspecified: Secondary | ICD-10-CM | POA: Diagnosis not present

## 2013-03-11 DIAGNOSIS — Z992 Dependence on renal dialysis: Secondary | ICD-10-CM | POA: Diagnosis not present

## 2013-03-11 DIAGNOSIS — N2581 Secondary hyperparathyroidism of renal origin: Secondary | ICD-10-CM | POA: Diagnosis not present

## 2013-03-11 DIAGNOSIS — N186 End stage renal disease: Secondary | ICD-10-CM | POA: Diagnosis not present

## 2013-03-11 DIAGNOSIS — D631 Anemia in chronic kidney disease: Secondary | ICD-10-CM | POA: Diagnosis not present

## 2013-03-14 DIAGNOSIS — N186 End stage renal disease: Secondary | ICD-10-CM | POA: Diagnosis not present

## 2013-03-14 DIAGNOSIS — Z992 Dependence on renal dialysis: Secondary | ICD-10-CM | POA: Diagnosis not present

## 2013-03-14 DIAGNOSIS — D509 Iron deficiency anemia, unspecified: Secondary | ICD-10-CM | POA: Diagnosis not present

## 2013-03-14 DIAGNOSIS — N2581 Secondary hyperparathyroidism of renal origin: Secondary | ICD-10-CM | POA: Diagnosis not present

## 2013-03-14 DIAGNOSIS — D631 Anemia in chronic kidney disease: Secondary | ICD-10-CM | POA: Diagnosis not present

## 2013-03-15 DIAGNOSIS — S82209A Unspecified fracture of shaft of unspecified tibia, initial encounter for closed fracture: Secondary | ICD-10-CM | POA: Diagnosis not present

## 2013-03-16 DIAGNOSIS — N2581 Secondary hyperparathyroidism of renal origin: Secondary | ICD-10-CM | POA: Diagnosis not present

## 2013-03-16 DIAGNOSIS — Z992 Dependence on renal dialysis: Secondary | ICD-10-CM | POA: Diagnosis not present

## 2013-03-16 DIAGNOSIS — D509 Iron deficiency anemia, unspecified: Secondary | ICD-10-CM | POA: Diagnosis not present

## 2013-03-16 DIAGNOSIS — N186 End stage renal disease: Secondary | ICD-10-CM | POA: Diagnosis not present

## 2013-03-16 DIAGNOSIS — D631 Anemia in chronic kidney disease: Secondary | ICD-10-CM | POA: Diagnosis not present

## 2013-03-18 DIAGNOSIS — D509 Iron deficiency anemia, unspecified: Secondary | ICD-10-CM | POA: Diagnosis not present

## 2013-03-18 DIAGNOSIS — Z992 Dependence on renal dialysis: Secondary | ICD-10-CM | POA: Diagnosis not present

## 2013-03-18 DIAGNOSIS — D631 Anemia in chronic kidney disease: Secondary | ICD-10-CM | POA: Diagnosis not present

## 2013-03-18 DIAGNOSIS — N2581 Secondary hyperparathyroidism of renal origin: Secondary | ICD-10-CM | POA: Diagnosis not present

## 2013-03-18 DIAGNOSIS — N186 End stage renal disease: Secondary | ICD-10-CM | POA: Diagnosis not present

## 2013-03-21 DIAGNOSIS — Z992 Dependence on renal dialysis: Secondary | ICD-10-CM | POA: Diagnosis not present

## 2013-03-21 DIAGNOSIS — D631 Anemia in chronic kidney disease: Secondary | ICD-10-CM | POA: Diagnosis not present

## 2013-03-21 DIAGNOSIS — N2581 Secondary hyperparathyroidism of renal origin: Secondary | ICD-10-CM | POA: Diagnosis not present

## 2013-03-21 DIAGNOSIS — D509 Iron deficiency anemia, unspecified: Secondary | ICD-10-CM | POA: Diagnosis not present

## 2013-03-21 DIAGNOSIS — N186 End stage renal disease: Secondary | ICD-10-CM | POA: Diagnosis not present

## 2013-03-23 DIAGNOSIS — N2581 Secondary hyperparathyroidism of renal origin: Secondary | ICD-10-CM | POA: Diagnosis not present

## 2013-03-23 DIAGNOSIS — N186 End stage renal disease: Secondary | ICD-10-CM | POA: Diagnosis not present

## 2013-03-23 DIAGNOSIS — D509 Iron deficiency anemia, unspecified: Secondary | ICD-10-CM | POA: Diagnosis not present

## 2013-03-23 DIAGNOSIS — D631 Anemia in chronic kidney disease: Secondary | ICD-10-CM | POA: Diagnosis not present

## 2013-03-23 DIAGNOSIS — Z992 Dependence on renal dialysis: Secondary | ICD-10-CM | POA: Diagnosis not present

## 2013-03-26 DIAGNOSIS — N186 End stage renal disease: Secondary | ICD-10-CM | POA: Diagnosis not present

## 2013-03-28 DIAGNOSIS — Z992 Dependence on renal dialysis: Secondary | ICD-10-CM | POA: Diagnosis not present

## 2013-03-28 DIAGNOSIS — N186 End stage renal disease: Secondary | ICD-10-CM | POA: Diagnosis not present

## 2013-03-28 DIAGNOSIS — D509 Iron deficiency anemia, unspecified: Secondary | ICD-10-CM | POA: Diagnosis not present

## 2013-03-28 DIAGNOSIS — D631 Anemia in chronic kidney disease: Secondary | ICD-10-CM | POA: Diagnosis not present

## 2013-03-28 DIAGNOSIS — N2581 Secondary hyperparathyroidism of renal origin: Secondary | ICD-10-CM | POA: Diagnosis not present

## 2013-04-04 DIAGNOSIS — H4011X0 Primary open-angle glaucoma, stage unspecified: Secondary | ICD-10-CM | POA: Diagnosis not present

## 2013-04-04 DIAGNOSIS — H409 Unspecified glaucoma: Secondary | ICD-10-CM | POA: Diagnosis not present

## 2013-04-05 ENCOUNTER — Ambulatory Visit: Payer: Medicare Other | Admitting: Internal Medicine

## 2013-04-19 DIAGNOSIS — S82209A Unspecified fracture of shaft of unspecified tibia, initial encounter for closed fracture: Secondary | ICD-10-CM | POA: Diagnosis not present

## 2013-04-25 DIAGNOSIS — N186 End stage renal disease: Secondary | ICD-10-CM | POA: Diagnosis not present

## 2013-04-27 DIAGNOSIS — Z23 Encounter for immunization: Secondary | ICD-10-CM | POA: Diagnosis not present

## 2013-04-27 DIAGNOSIS — R5082 Postprocedural fever: Secondary | ICD-10-CM | POA: Diagnosis not present

## 2013-04-27 DIAGNOSIS — N186 End stage renal disease: Secondary | ICD-10-CM | POA: Diagnosis not present

## 2013-04-27 DIAGNOSIS — D631 Anemia in chronic kidney disease: Secondary | ICD-10-CM | POA: Diagnosis not present

## 2013-04-27 DIAGNOSIS — D509 Iron deficiency anemia, unspecified: Secondary | ICD-10-CM | POA: Diagnosis not present

## 2013-04-27 DIAGNOSIS — Z992 Dependence on renal dialysis: Secondary | ICD-10-CM | POA: Diagnosis not present

## 2013-04-27 DIAGNOSIS — N2581 Secondary hyperparathyroidism of renal origin: Secondary | ICD-10-CM | POA: Diagnosis not present

## 2013-05-04 ENCOUNTER — Inpatient Hospital Stay: Admit: 2013-05-04 | Discharge: 2013-05-04 | Discharge status: Against Medical Advice | Attending: Emergency Medicine

## 2013-05-04 MED ORDER — CLONIDINE HCL 0.3 MG PO TABS
0.3 MG | ORAL_TABLET | Freq: Two times a day (BID) | ORAL | Status: DC
Start: 2013-05-04 — End: 2017-10-07

## 2013-05-04 MED ADMIN — cloNIDine (CATAPRES) tablet 0.1 mg: ORAL | @ 19:00:00 | NDC 62584065711

## 2013-05-04 MED ADMIN — hydrALAZINE (APRESOLINE) injection 10 mg: INTRAVENOUS | @ 20:00:00 | NDC 63323061401

## 2013-05-04 MED ADMIN — cloNIDine (CATAPRES) tablet 0.3 mg: ORAL | @ 18:00:00 | NDC 62584065711

## 2013-05-04 MED FILL — CLONIDINE HCL 0.1 MG PO TABS: 0.1 MG | ORAL | Qty: 3

## 2013-05-04 MED FILL — HYDRALAZINE HCL 20 MG/ML IJ SOLN: 20 MG/ML | INTRAMUSCULAR | Qty: 1

## 2013-05-04 NOTE — Discharge Instructions (Signed)
Learning About High Blood Pressure  What is high blood pressure?     Blood pressure is a measure of how hard the blood pushes against the walls of your arteries. It's normal for blood pressure to go up and down throughout the day, but if it stays up, you have high blood pressure. Another name for high blood pressure is hypertension.  Two numbers tell you your blood pressure. The first number is the systolic pressure. It shows how hard the blood pushes when your heart is pumping. The second number is the diastolic pressure. It shows how hard the blood pushes between heartbeats, when your heart is relaxed and filling with blood.  A blood pressure of less than 120/80 (say "120 over 80") is ideal for an adult. High blood pressure is 140/90 or higher. Many people fall into the category in between, called prehypertension. People with prehypertension need to make lifestyle changes to bring their blood pressure down and help prevent or delay high blood pressure.  What happens when you have high blood pressure?   Blood flows through your arteries with too much force. Over time, this damages the walls of your arteries. But you can't feel it. High blood pressure usually doesn't cause symptoms.   Fat and calcium start to build up in your arteries. This buildup is called plaque. Plaque makes your arteries narrower and stiffer. Blood can't flow through them as easily.   This lack of good blood flow starts to damage some of the organs in your body. This can lead to problems such as coronary artery disease and heart attack, heart failure, stroke, kidney failure, and eye damage.  How can you prevent high blood pressure?   Stay at a healthy weight.   Try to limit how much sodium you eat to less than 2,300 milligrams (mg) a day. And try to limit the sodium you eat to less than 1,500 mg a day if you are 51 or older, are black, or have high blood pressure, diabetes, or chronic kidney disease.   Buy foods that are labeled  "unsalted," "sodium-free," or "low-sodium." Foods labeled "reduced-sodium" and "light sodium" may still have too much sodium.   Flavor your food with garlic, lemon juice, onion, vinegar, herbs, and spices instead of salt. Do not use soy sauce, steak sauce, onion salt, garlic salt, mustard, or ketchup on your food.   Use less salt (or none) when recipes call for it. You can often use half the salt a recipe calls for without losing flavor.   Be physically active. Get at least 30 minutes of exercise on most days of the week. Walking is a good choice. You also may want to do other activities, such as running, swimming, cycling, or playing tennis or team sports.   Limit alcohol to 2 drinks a day for men and 1 drink a day for women.   Eat plenty of fruits, vegetables, and low-fat dairy products. Eat less saturated and total fats.  How is high blood pressure treated?   Your doctor will suggest making lifestyle changes. For example, your doctor may ask you to eat healthy foods, quit smoking, lose extra weight, and be more active.   If lifestyle changes don't help enough or your blood pressure is very high, you will have to take medicine every day.  Follow-up care is a key part of your treatment and safety. Be sure to make and go to all appointments, and call your doctor if you are having problems. It's also   a good idea to know your test results and keep a list of the medicines you take.   Where can you learn more?   Go to [email or web address] and sign in to your MyChart account. Enter P501 in the Search Health Information box to learn more about "Learning About High Blood Pressure."    If you do not have an account, please click on the "Sign Up Now" link.      2006-2014 [employer]. Care instructions adapted under license by [facility]. This care instruction is for use with your licensed healthcare professional. If you have questions about a medical condition or this  instruction, always ask your healthcare professional. [employer], Inc[employer] disclaims any warranty or liability for your use of this information.  Content Version: 10.1.311062; Current as of: October 05, 2012

## 2013-05-04 NOTE — ED Provider Notes (Signed)
HPI:  05/04/2013, Time: 2:14 PM         Dylan Beasley is a 62 y.o. male presenting to the ED for elevated blood pressure after he stopped taking his clonidine because he ran out of it, beginning 3 days ago.  The complaint has been persistent, moderate in severity, and worsened by nothing.  She states she feels slightly lightheaded he went to the [place] when they measured his blood pressure they told him to go directly to [facility] emergency department    ROS:   Unless otherwise stated in this report or unable to obtain because of the patient's clinical or mental status as evidenced by the medical record, this patients's positive and negative responses for Review of Systems, constitutional, psych, eyes, ENT, cardiovascular, respiratory, gastrointestinal, neurological, genitourinary, musculoskeletal, integument systems and systems related to the presenting problem are either stated in the preceding or were not pertinent or were negative for the symptoms and/or complaints related to the medical problem.      --------------------------------------------- PAST HISTORY ---------------------------------------------  Past Medical History:  has a past medical history of Hypertension and Atrial fibrillation.    Past Surgical History:  has no past surgical history on file.    Social History:  reports that he has been smoking.  He does not have any smokeless tobacco history on file. He reports that  drinks alcohol. He reports that he does not use illicit drugs.    Family History: family history is not on file.     The patient???s home medications have been reviewed.    Allergies: Review of patient's allergies indicates no known allergies.        ---------------------------------------------------PHYSICAL EXAM--------------------------------------   Nursing note and vitals reviewed.  Constitutional: He is oriented to person, place, and time. He appears well-developed and well-nourished. No distress.  Head: Normocephalic and  atraumatic.   Nose: Nose normal.   Eyes: Conjunctivae and EOM are normal. Pupils are equal, round, and reactive to light.   Throat:  Mucous membranes are moist.   Posterior pharynx is clear, no exudates.   Neck: Normal range of motion. Neck supple. No JVD present.   Lymphadenopathy: He has no cervical adenopathy.  Cardiovascular: Irregularly irregular rhythm, normal heart sounds and intact distal pulses.  Exam reveals no gallop and no friction rub. No murmur heard.   Pulmonary/Chest: Effort normal and breath sounds normal. No respiratory distress. Hehas no wheezes.   Abdominal: Soft. Bowel sounds are normal. No tenderness. He has no rebound and no guarding.   Musculoskeletal: Normal range of motion.        Right lower leg: He exhibits no tenderness and no swelling.        Left lower leg: He exhibits no tenderness and no swelling.      Neurological: He is alert and oriented to person, place, and time. He has normal strength. No cranial nerve deficit or sensory deficit.   Skin: Skin is warm and dry. No rash noted.   Psychiatric: He has a normal mood and affect. His behavior is normal.       -------------------------------------------------- RESULTS -------------------------------------------------  I have personally reviewed all laboratory and imaging results for this patient. Results are listed below.     LABS:  No results found for this visit on 05/04/13.    RADIOLOGY:  Interpreted by Radiologist.         EKG Interpretation  Interpreted by emergency department physician  Rhythm: atrial fibrillation - controlled  Rate: normal  Axis: normal  Conduction: normal  ST Segments: depression in  multiple  T Waves: non specific changes    Clinical Impression: non-specific EKG, atrial fibrillation (chronic) and myocardial ischemia  Comparison to prior EKG: None      ------------------------- NURSING NOTES AND VITALS REVIEWED ---------------------------   The nursing notes within the ED encounter and vital signs as below have  been reviewed by myself.  BP 210/124   Pulse 81   Temp(Src) 97.9 ??F (36.6 ??C) (Oral)   Resp 14   Ht 6' 4"$  (1.93 m)   Wt 187 lb (84.823 kg)   BMI 22.77 kg/m2   SpO2 98%  Oxygen Saturation Interpretation: Normal    The patient???s available past medical records and past encounters were reviewed.        ------------------------------ ED COURSE/MEDICAL DECISION MAKING----------------------  Medications   cloNIDine (CATAPRES) tablet 0.3 mg (0.3 mg Oral Given 05/04/13 1350)             Medical Decision Making:    Patient had been on his clonidine and then ran out of clonidine. He now has rebound hypertension I treated him with 2 doses of clonidine and he remained persistently hypertensive therefore I gave him a dose of hydralazine intravenously and observed him for further period of time and then discharged him home with a refill prescription for his clonidine and instructions not to run out of his clonidine in the future.    Re-Evaluations:             Re-evaluation.  Patient???s symptoms are improving      Consultations:             0    Critical Care: 0        This patient's ED course included: a personal history and physicial eaxmination, re-evaluation prior to disposition, multiple bedside re-evaluations, IV medications, cardiac monitoring, continuous pulse oximetry and complex medical decision making and emergency management    This patient has remained hemodynamically stable and improved during their ED course.    Counseling:   The emergency provider has spoken with the patient and discussed today???s results, in addition to providing specific details for the plan of care and counseling regarding the diagnosis and prognosis.  Questions are answered at this time and they are agreeable with the plan.       --------------------------------- IMPRESSION AND DISPOSITION ---------------------------------    IMPRESSION  1. Unspecified essential hypertension        DISPOSITION  Disposition: Discharge to home  Patient condition is  stable        NOTE: This report was transcribed using voice recognition software. Every effort was made to ensure accuracy; however, inadvertent computerized transcription errors may be present                Onalee Hua, MD  05/04/13 1658

## 2013-05-05 LAB — EKG 12-LEAD

## 2013-05-08 ENCOUNTER — Inpatient Hospital Stay (HOSPITAL_COMMUNITY)
Admission: EM | Admit: 2013-05-08 | Discharge: 2013-05-15 | DRG: 255 | Disposition: A | Discharge status: Home / Self Care | Payer: Medicare Other | Attending: Internal Medicine | Admitting: Internal Medicine

## 2013-05-08 ENCOUNTER — Ambulatory Visit (INDEPENDENT_AMBULATORY_CARE_PROVIDER_SITE_OTHER): Payer: Medicare Other | Admitting: Podiatry

## 2013-05-08 ENCOUNTER — Encounter (HOSPITAL_COMMUNITY): Payer: Self-pay | Admitting: Emergency Medicine

## 2013-05-08 ENCOUNTER — Encounter: Payer: Self-pay | Admitting: Podiatry

## 2013-05-08 ENCOUNTER — Emergency Department (HOSPITAL_COMMUNITY): Payer: Medicare Other

## 2013-05-08 VITALS — BP 185/86 | HR 81 | Temp 99.2°F | Resp 18

## 2013-05-08 DIAGNOSIS — I10 Essential (primary) hypertension: Secondary | ICD-10-CM

## 2013-05-08 DIAGNOSIS — D62 Acute posthemorrhagic anemia: Secondary | ICD-10-CM

## 2013-05-08 DIAGNOSIS — F528 Other sexual dysfunction not due to a substance or known physiological condition: Secondary | ICD-10-CM

## 2013-05-08 DIAGNOSIS — E1149 Type 2 diabetes mellitus with other diabetic neurological complication: Secondary | ICD-10-CM | POA: Diagnosis present

## 2013-05-08 DIAGNOSIS — N186 End stage renal disease: Secondary | ICD-10-CM

## 2013-05-08 DIAGNOSIS — N320 Bladder-neck obstruction: Secondary | ICD-10-CM

## 2013-05-08 DIAGNOSIS — S91109A Unspecified open wound of unspecified toe(s) without damage to nail, initial encounter: Secondary | ICD-10-CM | POA: Diagnosis not present

## 2013-05-08 DIAGNOSIS — F3289 Other specified depressive episodes: Secondary | ICD-10-CM

## 2013-05-08 DIAGNOSIS — K8020 Calculus of gallbladder without cholecystitis without obstruction: Secondary | ICD-10-CM

## 2013-05-08 DIAGNOSIS — L98499 Non-pressure chronic ulcer of skin of other sites with unspecified severity: Secondary | ICD-10-CM | POA: Diagnosis present

## 2013-05-08 DIAGNOSIS — Z992 Dependence on renal dialysis: Secondary | ICD-10-CM | POA: Diagnosis not present

## 2013-05-08 DIAGNOSIS — M2060 Acquired deformities of toe(s), unspecified, unspecified foot: Secondary | ICD-10-CM | POA: Diagnosis not present

## 2013-05-08 DIAGNOSIS — E1159 Type 2 diabetes mellitus with other circulatory complications: Secondary | ICD-10-CM | POA: Diagnosis not present

## 2013-05-08 DIAGNOSIS — D631 Anemia in chronic kidney disease: Secondary | ICD-10-CM | POA: Diagnosis not present

## 2013-05-08 DIAGNOSIS — E1142 Type 2 diabetes mellitus with diabetic polyneuropathy: Secondary | ICD-10-CM | POA: Diagnosis present

## 2013-05-08 DIAGNOSIS — K219 Gastro-esophageal reflux disease without esophagitis: Secondary | ICD-10-CM | POA: Diagnosis present

## 2013-05-08 DIAGNOSIS — E119 Type 2 diabetes mellitus without complications: Secondary | ICD-10-CM | POA: Diagnosis not present

## 2013-05-08 DIAGNOSIS — Z87442 Personal history of urinary calculi: Secondary | ICD-10-CM

## 2013-05-08 DIAGNOSIS — Z8673 Personal history of transient ischemic attack (TIA), and cerebral infarction without residual deficits: Secondary | ICD-10-CM

## 2013-05-08 DIAGNOSIS — M899 Disorder of bone, unspecified: Secondary | ICD-10-CM | POA: Diagnosis present

## 2013-05-08 DIAGNOSIS — E785 Hyperlipidemia, unspecified: Secondary | ICD-10-CM

## 2013-05-08 DIAGNOSIS — K5900 Constipation, unspecified: Secondary | ICD-10-CM

## 2013-05-08 DIAGNOSIS — K6289 Other specified diseases of anus and rectum: Secondary | ICD-10-CM

## 2013-05-08 DIAGNOSIS — IMO0002 Reserved for concepts with insufficient information to code with codable children: Secondary | ICD-10-CM

## 2013-05-08 DIAGNOSIS — Z8679 Personal history of other diseases of the circulatory system: Secondary | ICD-10-CM

## 2013-05-08 DIAGNOSIS — E1129 Type 2 diabetes mellitus with other diabetic kidney complication: Secondary | ICD-10-CM | POA: Diagnosis present

## 2013-05-08 DIAGNOSIS — N2581 Secondary hyperparathyroidism of renal origin: Secondary | ICD-10-CM | POA: Diagnosis not present

## 2013-05-08 DIAGNOSIS — I96 Gangrene, not elsewhere classified: Secondary | ICD-10-CM | POA: Diagnosis present

## 2013-05-08 DIAGNOSIS — M86179 Other acute osteomyelitis, unspecified ankle and foot: Secondary | ICD-10-CM | POA: Diagnosis not present

## 2013-05-08 DIAGNOSIS — R935 Abnormal findings on diagnostic imaging of other abdominal regions, including retroperitoneum: Secondary | ICD-10-CM

## 2013-05-08 DIAGNOSIS — E1029 Type 1 diabetes mellitus with other diabetic kidney complication: Secondary | ICD-10-CM

## 2013-05-08 DIAGNOSIS — I120 Hypertensive chronic kidney disease with stage 5 chronic kidney disease or end stage renal disease: Secondary | ICD-10-CM | POA: Diagnosis present

## 2013-05-08 DIAGNOSIS — N400 Enlarged prostate without lower urinary tract symptoms: Secondary | ICD-10-CM

## 2013-05-08 DIAGNOSIS — Z8619 Personal history of other infectious and parasitic diseases: Secondary | ICD-10-CM

## 2013-05-08 DIAGNOSIS — F329 Major depressive disorder, single episode, unspecified: Secondary | ICD-10-CM

## 2013-05-08 DIAGNOSIS — I517 Cardiomegaly: Secondary | ICD-10-CM

## 2013-05-08 DIAGNOSIS — I110 Hypertensive heart disease with heart failure: Secondary | ICD-10-CM | POA: Diagnosis present

## 2013-05-08 DIAGNOSIS — I509 Heart failure, unspecified: Secondary | ICD-10-CM | POA: Diagnosis not present

## 2013-05-08 DIAGNOSIS — I70269 Atherosclerosis of native arteries of extremities with gangrene, unspecified extremity: Secondary | ICD-10-CM | POA: Diagnosis not present

## 2013-05-08 DIAGNOSIS — I739 Peripheral vascular disease, unspecified: Secondary | ICD-10-CM | POA: Diagnosis present

## 2013-05-08 DIAGNOSIS — M545 Low back pain: Secondary | ICD-10-CM | POA: Diagnosis not present

## 2013-05-08 DIAGNOSIS — I5041 Acute combined systolic (congestive) and diastolic (congestive) heart failure: Secondary | ICD-10-CM

## 2013-05-08 DIAGNOSIS — I5042 Chronic combined systolic (congestive) and diastolic (congestive) heart failure: Secondary | ICD-10-CM | POA: Diagnosis not present

## 2013-05-08 DIAGNOSIS — K922 Gastrointestinal hemorrhage, unspecified: Secondary | ICD-10-CM

## 2013-05-08 DIAGNOSIS — L97509 Non-pressure chronic ulcer of other part of unspecified foot with unspecified severity: Secondary | ICD-10-CM

## 2013-05-08 DIAGNOSIS — E1165 Type 2 diabetes mellitus with hyperglycemia: Secondary | ICD-10-CM | POA: Diagnosis present

## 2013-05-08 DIAGNOSIS — D638 Anemia in other chronic diseases classified elsewhere: Secondary | ICD-10-CM

## 2013-05-08 DIAGNOSIS — G459 Transient cerebral ischemic attack, unspecified: Secondary | ICD-10-CM

## 2013-05-08 LAB — CBC WITH DIFFERENTIAL/PLATELET
Basophils Absolute: 0 10*3/uL (ref 0.0–0.1)
Basophils Relative: 0 % (ref 0–1)
Eosinophils Absolute: 0.1 10*3/uL (ref 0.0–0.7)
HCT: 39.5 % (ref 39.0–52.0)
Hemoglobin: 12.5 g/dL — ABNORMAL LOW (ref 13.0–17.0)
Lymphocytes Relative: 18 % (ref 12–46)
MCH: 23 pg — ABNORMAL LOW (ref 26.0–34.0)
MCHC: 31.6 g/dL (ref 30.0–36.0)
Monocytes Absolute: 0.9 10*3/uL (ref 0.1–1.0)
Neutro Abs: 5 10*3/uL (ref 1.7–7.7)
RDW: 25.7 % — ABNORMAL HIGH (ref 11.5–15.5)

## 2013-05-08 LAB — COMPREHENSIVE METABOLIC PANEL
Albumin: 3.3 g/dL — ABNORMAL LOW (ref 3.5–5.2)
BUN: 36 mg/dL — ABNORMAL HIGH (ref 6–23)
Calcium: 10.2 mg/dL (ref 8.4–10.5)
Creatinine, Ser: 9.27 mg/dL — ABNORMAL HIGH (ref 0.50–1.35)
GFR calc Af Amer: 6 mL/min — ABNORMAL LOW (ref >90)
Glucose, Bld: 139 mg/dL — ABNORMAL HIGH (ref 70–99)
Total Protein: 9.4 g/dL — ABNORMAL HIGH (ref 6.0–8.3)

## 2013-05-08 MED ORDER — VANCOMYCIN HCL 10 G IV SOLR
2000.0000 mg | Freq: Once | INTRAVENOUS | Status: AC
  Administered 2013-05-08: 2000 mg via INTRAVENOUS
  Filled 2013-05-08: qty 2000

## 2013-05-08 MED ORDER — ONDANSETRON HCL 4 MG/2ML IJ SOLN: 4.0000 mg | Freq: Three times a day (TID) | INTRAMUSCULAR | Status: AC | PRN

## 2013-05-08 MED ORDER — PIPERACILLIN-TAZOBACTAM IN DEX 2-0.25 GM/50ML IV SOLN
2.2500 g | Freq: Three times a day (TID) | INTRAVENOUS | Status: DC
  Administered 2013-05-08 – 2013-05-15 (×19): 2.25 g via INTRAVENOUS
  Filled 2013-05-08 (×25): qty 50

## 2013-05-08 MED ORDER — HYDRALAZINE HCL 20 MG/ML IJ SOLN
10.0000 mg | INTRAMUSCULAR | Status: DC | PRN
  Administered 2013-05-08 – 2013-05-12 (×5): 10 mg via INTRAVENOUS
  Filled 2013-05-08 (×6): qty 1

## 2013-05-08 MED ORDER — VANCOMYCIN HCL IN DEXTROSE 1-5 GM/200ML-% IV SOLN
1000.0000 mg | INTRAVENOUS | Status: DC
  Administered 2013-05-09 – 2013-05-13 (×3): 1000 mg via INTRAVENOUS
  Filled 2013-05-08 (×3): qty 200

## 2013-05-08 NOTE — ED Notes (Signed)
Dopplered pulse in L[facility].

## 2013-05-08 NOTE — ED Notes (Signed)
Pt fell on Friday while walking with his walker and injured his big toe on the left foot. Per wife they were cleaning the toe with peroxide but noticed last night the toe began to turn black and they called the podiatrist. Pt has DM and has had issues with the toe before. Pt states he has no pain to the toe and is unable to feel anything. Toe has discoloration and odor.

## 2013-05-08 NOTE — Progress Notes (Signed)
ANTIBIOTIC CONSULT NOTE - INITIAL  Pharmacy Consult for Vancomycin and Zosyn Indication: Gangrenous toe wound  No Known Allergies  Patient Measurements:    Ht: 70 in  Wt: ~90 kg  Vital Signs: Temp: 98.3 F (36.8 C) (10/13 1943) Temp src: Oral (10/13 1943) BP: 175/81 mmHg (10/13 2100) Pulse Rate: 80 (10/13 2100) Intake/Output from previous day:   Intake/Output from this shift:    Labs:  Recent Labs  05/08/13 1806  WBC 7.3  HGB 12.5*  PLT 398  CREATININE 9.27*   The CrCl is unknown because both a height and weight (above a minimum accepted value) are required for this calculation. No results found for this basename: VANCOTROUGH, VANCOPEAK, VANCORANDOM, GENTTROUGH, GENTPEAK, GENTRANDOM, TOBRATROUGH, TOBRAPEAK, TOBRARND, AMIKACINPEAK, AMIKACINTROU, AMIKACIN,  in the last 72 hours   Microbiology: No results found for this or any previous visit (from the past 720 hour(s)).  Medical History: Past Medical History  Diagnosis Date  . ALLERGIC RHINITIS 05/05/2007  . BACK PAIN, LUMBAR, CHRONIC 08/06/2009  . BENIGN PROSTATIC HYPERTROPHY 08/01/2010  . CEREBROVASCULAR ACCIDENT, HX OF 08/06/2009  . CHOLELITHIASIS 08/01/2010  . CONGESTIVE HEART FAILURE 03/18/2009  . DEPRESSION 03/18/2009  . DIABETES MELLITUS, TYPE II 03/25/2007  . ERECTILE DYSFUNCTION 03/25/2007  . GERD 03/25/2007  . HEPATITIS C, HX OF 03/25/2007  . HYPERTENSION 03/25/2007  . Morbid obesity 03/25/2007  . NEPHROLITHIASIS, HX OF 03/18/2009  . ESRD on hemodialysis 09/08/2012    ESRD due to DM/HTN. Started dialysis in November 2013. Has LUA AVF placed Jun 14 2012 which is maturing as of Feb 2014.   [name] Renal insufficiency     Medications:  See electronic med rec  Assessment: 62 y.o. male presents with gangrenous toe wound. To begin Vancomycin and Zosyn. Noted pt with ESRD and on hemodialysis Tues/Thur/Sat as o/p. Tm 99.2. Wbc wnl.  Goal of Therapy:  Pre-HD vanc level 15-25 mcg/ml  Plan:  1. Vancomycin 2gm IV now then 1gm  IV past HD 2. Zosyn 2.25gm IV q8h 3. Will f/u micro data, pt's clinical condition, pre-HD level prn  Christoper Fabian, PharmD, BCPS Clinical pharmacist, pager [phone] 05/08/2013,9:44 PM

## 2013-05-08 NOTE — ED Notes (Signed)
Pt was sent here from foot center after tripping over throw rug and pulled up toenail.  Pt is diabetic, has discoloration to left foot and great toe, odor, and it has been bleeding.  Foot has redness and warmth.

## 2013-05-08 NOTE — Progress Notes (Signed)
  Subjective:    Patient ID: Oscar Castillo, male    DOB: 08/29/1950, 62 y.o.   MRN: [id number]  HPI fell on Friday and opened up the toenail and left leg is broke and fell again on Saturday and was bleeding and used peroxide and neosporin, can't feel anything at the toe area and has changed color, smell some blood odor    Review of Systems  Constitutional: Negative.   HENT: Negative.   Eyes: Negative.        [place]  Respiratory: Negative.   Cardiovascular: Negative.   Gastrointestinal: Negative.   Genitourinary: Negative.   Musculoskeletal: Negative.   Skin:       Hands get dark and then clear up  Allergic/Immunologic: Negative.   Neurological: Negative.   Psychiatric/Behavioral: Negative.        Depressed about being on dialysis three times a week       Objective:   Physical Exam Vascular: The DP and PT pulses are nonpalpable bilaterally. The distal left hallux has no capillary reflex and is cold to touch. Neurological: Sensation detained in monofilament wire intact 1/10 locations bilaterally.  Dermatological: A 10 mm ulcer is noted on the dorsum of the left hallux. The nail on the left hallux is sloughing with a slight malodor noted. There is evidence of edema at noted as well in the left hallux.  Musculoskeletal: Overlapping second digits noted bilaterally.         Assessment & Plan:  Assessment: Peripheral arterial disease bilaterally. Possible impending gangrene left hallux. Skin ulcer left hallux with evidence of trauma and possible cellulitis. Sensory neuropathy bilaterally.  Plan: At this time I recommended to the patient with his wife present that he goes to the: Emergency room today.

## 2013-05-08 NOTE — Patient Instructions (Addendum)
Refer to ER today for evaluation of foot ulcer, trauma , PAD.

## 2013-05-08 NOTE — ED Provider Notes (Signed)
CSN: [id number]     Arrival date & time 05/08/13  1646 History   First MD Initiated Contact with Patient 05/08/13 2000     Chief Complaint  Patient presents with  . toe/foot infection    (Consider location/radiation/quality/duration/timing/severity/associated sxs/prior Treatment) HPI  [name] is a 62 y.o. male past medical history significant for insulin-dependent diabetes, and ESRD on dialysis, complaining of nonhealing wound to left great toe. Patient tripped and snagged the toenail on the right 3 days ago, since that time the wound has increased in size, has had episodes of heavy bleeding, and his generated foul odor. Patient is in no pain. Patient denies fever, nausea vomiting, chest pain, shortness of breath, change in bowel or bladder habits.  Patient has seen Dr. Dion Saucier at [facility] for fracture to left lower extremity over the last year.  Past Medical History  Diagnosis Date  . ALLERGIC RHINITIS 05/05/2007  . BACK PAIN, LUMBAR, CHRONIC 08/06/2009  . BENIGN PROSTATIC HYPERTROPHY 08/01/2010  . CEREBROVASCULAR ACCIDENT, HX OF 08/06/2009  . CHOLELITHIASIS 08/01/2010  . CONGESTIVE HEART FAILURE 03/18/2009  . DEPRESSION 03/18/2009  . DIABETES MELLITUS, TYPE II 03/25/2007  . ERECTILE DYSFUNCTION 03/25/2007  . GERD 03/25/2007  . HEPATITIS C, HX OF 03/25/2007  . HYPERTENSION 03/25/2007  . Morbid obesity 03/25/2007  . NEPHROLITHIASIS, HX OF 03/18/2009  . ESRD on hemodialysis 09/08/2012    ESRD due to DM/HTN. Started dialysis in November 2013. Has LUA AVF placed Jun 14 2012 which is maturing as of Feb 2014.   [name] Renal insufficiency    Past Surgical History  Procedure Laterality Date  . Nephrectomy      partial RR  . Av fistula placement  06/14/2012    Procedure: ARTERIOVENOUS (AV) FISTULA CREATION;  Surgeon: Chuck Hint, MD;  Location: [facility] OR;  Service: Vascular;  Laterality: Left;  Left basilic vein transposition with fistula.  . Tibia im nail insertion Left 09/09/2012   Procedure: INTRAMEDULLARY (IM) NAIL TIBIAL;  Surgeon: Eulas Post, MD;  Location: [facility] OR;  Service: Orthopedics;  Laterality: Left;  left tibial nail and open reduction internal fixation left fibula fracture  . Orif fibula fracture Left 09/09/2012    Procedure: OPEN REDUCTION INTERNAL FIXATION (ORIF) FIBULA FRACTURE;  Surgeon: Eulas Post, MD;  Location: [facility] OR;  Service: Orthopedics;  Laterality: Left;  . Colonoscopy N/A 10/28/2012    Procedure: COLONOSCOPY;  Surgeon: Petra Kuba, MD;  Location: [facility] ENDOSCOPY;  Service: Endoscopy;  Laterality: N/A;  . Esophagogastroduodenoscopy N/A 11/02/2012    Procedure: ESOPHAGOGASTRODUODENOSCOPY (EGD);  Surgeon: Florencia Reasons, MD;  Location: [facility] ENDOSCOPY;  Service: Endoscopy;  Laterality: N/A;  . Colonoscopy N/A 11/02/2012    Procedure: COLONOSCOPY;  Surgeon: Florencia Reasons, MD;  Location: [facility] ENDOSCOPY;  Service: Endoscopy;  Laterality: N/A;  . Colonoscopy N/A 11/03/2012    Procedure: COLONOSCOPY;  Surgeon: Florencia Reasons, MD;  Location: [facility] ENDOSCOPY;  Service: Endoscopy;  Laterality: N/A;  . Givens capsule study N/A 11/04/2012    Procedure: GIVENS CAPSULE STUDY;  Surgeon: Florencia Reasons, MD;  Location: [facility] ENDOSCOPY;  Service: Endoscopy;  Laterality: N/A;  . Enteroscopy N/A 11/08/2012    Procedure: ENTEROSCOPY;  Surgeon: Graylin Shiver, MD;  Location: [facility] ENDOSCOPY;  Service: Endoscopy;  Laterality: N/A;   Family History  Problem Relation Age of Onset  . Coronary artery disease Other   . Diabetes Other    History  Substance Use Topics  . Smoking status: Never Smoker   .  Smokeless tobacco: Not on file  . Alcohol Use: No    Review of Systems 10 systems reviewed and found to be negative, except as noted in the HPI   Allergies  Review of patient's allergies indicates no known allergies.  Home Medications   Current Outpatient Rx  Name  Route  Sig  Dispense  Refill  . amLODipine (NORVASC) 10 MG tablet   Oral   Take 10 mg by mouth daily.           [name] aspirin EC 81 MG tablet   Oral   Take 1 tablet (81 mg total) by mouth daily.   90 tablet   5   . Brinzolamide-Brimonidine (SIMBRINZA) 1-0.2 % SUSP   Right Eye   Place 1 drop into the right eye 2 (two) times daily.         . calcium acetate (PHOSLO) 667 MG capsule   Oral   Take 2 capsules (1,334 mg total) by mouth 3 (three) times daily with meals.   180 capsule   0   . docusate sodium (COLACE) 100 MG capsule   Oral   Take 100 mg by mouth daily.         . Insulin Glargine (LANTUS SOLOSTAR [place] Walk)   Subcutaneous   Inject 5 Units into the skin every evening.         . labetalol (NORMODYNE) 100 MG tablet   Oral   Take 50 mg by mouth 2 (two) times daily.         . pantoprazole (PROTONIX) 40 MG tablet   Oral   Take 40 mg by mouth daily as needed (indigestion).         . traMADol (ULTRAM) 50 MG tablet   Oral   Take 50 mg by mouth every 8 (eight) hours as needed for pain.         . Travoprost, BAK Free, (TRAVATAN) 0.004 % SOLN ophthalmic solution   Both Eyes   Place 1 drop into both eyes at bedtime.   1 Bottle   0   . traZODone (DESYREL) 50 MG tablet   Oral   Take 50 mg by mouth at bedtime as needed for sleep.          BP 149/78  Pulse 80  Temp(Src) 98.3 F (36.8 C) (Oral)  Resp 16  SpO2 100% Physical Exam  Nursing note and vitals reviewed. Constitutional: He is oriented to person, place, and time. He appears well-developed and well-nourished. No distress.  HENT:  Head: Normocephalic.  Mouth/Throat: Oropharynx is clear and moist.  Eyes: Conjunctivae and EOM are normal. Pupils are equal, round, and reactive to light.  Neck: Normal range of motion.  Cardiovascular: Normal rate and regular rhythm.   Pulmonary/Chest: Effort normal and breath sounds normal. No stridor.  Abdominal: Soft. He exhibits no distension and no mass. There is no tenderness. There is no rebound and no guarding.  Musculoskeletal: Normal range of motion.  Foul smell from  left great toe, it is discolored with no cap-refill.   Neurological: He is alert and oriented to person, place, and time.  Psychiatric: He has a normal mood and affect.                   ED Course  Procedures (including critical care time) Labs Review Labs Reviewed  CBC WITH DIFFERENTIAL - Abnormal; Notable for the following:    Hemoglobin 12.5 (*)    MCV 72.7 (*)    [facility]  23.0 (*)    RDW 25.7 (*)    All other components within normal limits  COMPREHENSIVE METABOLIC PANEL - Abnormal; Notable for the following:    Sodium 132 (*)    Chloride 89 (*)    Glucose, Bld 139 (*)    BUN 36 (*)    Creatinine, Ser 9.27 (*)    Total Protein 9.4 (*)    Albumin 3.3 (*)    GFR calc non Af Amer 5 (*)    GFR calc Af Amer 6 (*)    All other components within normal limits  WOUND CULTURE   Imaging Review Dg Foot Complete Left  05/08/2013   CLINICAL DATA:  Infected left great toe  EXAM: LEFT FOOT - COMPLETE 3+ VIEW  COMPARISON:  None.  FINDINGS: No fracture or dislocation is seen.  Deformity at the 1st IP joint, favored to be chronic. No definite cortical regularity to suggest acute osteomyelitis.  Mild soft tissue swelling along the 1st digit.  Postsurgical changes involvement the distal tibia/fibula. The healing tibial fracture deformity. Fracture lucency remains visible.  IMPRESSION: No radiographic findings specific for acute osteomyelitis.  Deformity at the 1st IP joint, favored to be chronic.  Healing fracture deformity involving the distal tibia/fibula, status post ORIF.   Electronically Signed   By: Charline Bills M.D.   On: 05/08/2013 17:52    EKG Interpretation   None       MDM   1. Open toe wound, initial encounter      Filed Vitals:   05/08/13 1943 05/08/13 2000 05/08/13 2030 05/08/13 2100  BP: 149/78 178/90 168/86 175/81  Pulse: 80 80 81 80  Temp: 98.3 F (36.8 C)     TempSrc: Oral     Resp: 16     SpO2: 100% 100% 100% 100%     Oscar Castillo is a 62  y.o. male with insulin-dependent diabetes complaining of worsening wound to left great toe. On exam the toe is dusky, with no Refill and a foul smelling odor consistent with dry gangrene. No systemic signs of infection, afebrile with no leukocytosis.   Orthopedic consult from Dr. Carola Frost appreciated: He recommends a medical admission and they will consult. Recommends wound culture and blood broad spectrum antibiotics. Pt will be admitted to Dr. Toniann Fail.  Note: Portions of this report may have been transcribed using voice recognition software. Every effort was made to ensure accuracy; however, inadvertent computerized transcription errors may be present    Wynetta Emery, PA-C 05/08/13 2300

## 2013-05-09 ENCOUNTER — Encounter (HOSPITAL_COMMUNITY): Payer: Self-pay | Admitting: Internal Medicine

## 2013-05-09 ENCOUNTER — Encounter (HOSPITAL_COMMUNITY)
Admission: EM | Disposition: A | Discharge status: Home / Self Care | Payer: Self-pay | Source: Home / Self Care | Attending: Internal Medicine

## 2013-05-09 DIAGNOSIS — E119 Type 2 diabetes mellitus without complications: Secondary | ICD-10-CM

## 2013-05-09 DIAGNOSIS — E1165 Type 2 diabetes mellitus with hyperglycemia: Secondary | ICD-10-CM | POA: Diagnosis present

## 2013-05-09 DIAGNOSIS — N186 End stage renal disease: Secondary | ICD-10-CM | POA: Diagnosis present

## 2013-05-09 DIAGNOSIS — S91109A Unspecified open wound of unspecified toe(s) without damage to nail, initial encounter: Secondary | ICD-10-CM

## 2013-05-09 DIAGNOSIS — N320 Bladder-neck obstruction: Secondary | ICD-10-CM

## 2013-05-09 DIAGNOSIS — D62 Acute posthemorrhagic anemia: Secondary | ICD-10-CM

## 2013-05-09 LAB — CBC
HCT: 36.7 % — ABNORMAL LOW (ref 39.0–52.0)
Hemoglobin: 11.8 g/dL — ABNORMAL LOW (ref 13.0–17.0)
MCH: 23.3 pg — ABNORMAL LOW (ref 26.0–34.0)
MCHC: 32.2 g/dL (ref 30.0–36.0)
MCV: 72.4 fL — ABNORMAL LOW (ref 78.0–100.0)
Platelets: 374 K/uL (ref 150–400)
RBC: 5.07 MIL/uL (ref 4.22–5.81)
RDW: 25.7 % — ABNORMAL HIGH (ref 11.5–15.5)
WBC: 7.8 K/uL (ref 4.0–10.5)

## 2013-05-09 LAB — SEDIMENTATION RATE: Sed Rate: 27 mm/h — ABNORMAL HIGH (ref 0–16)

## 2013-05-09 LAB — BASIC METABOLIC PANEL
BUN: 39 mg/dL — ABNORMAL HIGH (ref 6–23)
CO2: 25 mEq/L (ref 19–32)
Calcium: 9.6 mg/dL (ref 8.4–10.5)
GFR calc non Af Amer: 5 mL/min — ABNORMAL LOW (ref >90)
Glucose, Bld: 155 mg/dL — ABNORMAL HIGH (ref 70–99)
Potassium: 4 mEq/L (ref 3.5–5.1)
Sodium: 131 mEq/L — ABNORMAL LOW (ref 135–145)

## 2013-05-09 LAB — GLUCOSE, CAPILLARY
Glucose-Capillary: 124 mg/dL — ABNORMAL HIGH (ref 70–99)
Glucose-Capillary: 137 mg/dL — ABNORMAL HIGH (ref 70–99)
Glucose-Capillary: 160 mg/dL — ABNORMAL HIGH (ref 70–99)
Glucose-Capillary: 241 mg/dL — ABNORMAL HIGH (ref 70–99)

## 2013-05-09 LAB — C-REACTIVE PROTEIN: CRP: 6.7 mg/dL — ABNORMAL HIGH

## 2013-05-09 SURGERY — CANCELLED PROCEDURE

## 2013-05-09 MED ORDER — ONDANSETRON HCL 4 MG PO TABS: 4.0000 mg | ORAL_TABLET | Freq: Four times a day (QID) | ORAL | Status: DC | PRN

## 2013-05-09 MED ORDER — TRAMADOL HCL 50 MG PO TABS: 50.0000 mg | ORAL_TABLET | Freq: Three times a day (TID) | ORAL | Status: DC | PRN

## 2013-05-09 MED ORDER — INSULIN ASPART 100 UNIT/ML ~~LOC~~ SOLN
0.0000 [IU] | Freq: Three times a day (TID) | SUBCUTANEOUS | Status: DC
  Administered 2013-05-09: 13:00:00 2 [IU] via SUBCUTANEOUS
  Administered 2013-05-09: 1 [IU] via SUBCUTANEOUS
  Administered 2013-05-10: 18:00:00 3 [IU] via SUBCUTANEOUS
  Administered 2013-05-10: 08:00:00 1 [IU] via SUBCUTANEOUS
  Administered 2013-05-10: 3 [IU] via SUBCUTANEOUS
  Administered 2013-05-11: 1 [IU] via SUBCUTANEOUS
  Administered 2013-05-11 – 2013-05-13 (×2): 2 [IU] via SUBCUTANEOUS
  Administered 2013-05-14: 1 [IU] via SUBCUTANEOUS
  Administered 2013-05-14: 2 [IU] via SUBCUTANEOUS
  Administered 2013-05-14: 13:00:00 3 [IU] via SUBCUTANEOUS
  Administered 2013-05-15 (×2): 2 [IU] via SUBCUTANEOUS

## 2013-05-09 MED ORDER — AMLODIPINE BESYLATE 10 MG PO TABS
10.0000 mg | ORAL_TABLET | Freq: Every day | ORAL | Status: DC
  Administered 2013-05-10 – 2013-05-15 (×6): 10 mg via ORAL
  Filled 2013-05-09 (×7): qty 1

## 2013-05-09 MED ORDER — DOXERCALCIFEROL 4 MCG/2ML IV SOLN
INTRAVENOUS | Status: AC
  Administered 2013-05-09: 2 ug via INTRAVENOUS
  Filled 2013-05-09: qty 2

## 2013-05-09 MED ORDER — CALCIUM ACETATE 667 MG PO CAPS
1334.0000 mg | ORAL_CAPSULE | Freq: Three times a day (TID) | ORAL | Status: DC
  Administered 2013-05-09 – 2013-05-10 (×5): 1334 mg via ORAL
  Filled 2013-05-09 (×10): qty 2

## 2013-05-09 MED ORDER — ONDANSETRON HCL 4 MG/2ML IJ SOLN
4.0000 mg | Freq: Four times a day (QID) | INTRAMUSCULAR | Status: DC | PRN
  Administered 2013-05-11: 4 mg via INTRAVENOUS
  Filled 2013-05-09: qty 2

## 2013-05-09 MED ORDER — PANTOPRAZOLE SODIUM 40 MG PO TBEC: 40.0000 mg | DELAYED_RELEASE_TABLET | Freq: Every day | ORAL | Status: DC | PRN

## 2013-05-09 MED ORDER — DOXERCALCIFEROL 4 MCG/2ML IV SOLN
2.0000 ug | INTRAVENOUS | Status: DC
  Administered 2013-05-09 – 2013-05-13 (×3): 2 ug via INTRAVENOUS
  Filled 2013-05-09 (×3): qty 2

## 2013-05-09 MED ORDER — HEPARIN SODIUM (PORCINE) 5000 UNIT/ML IJ SOLN
5000.0000 [IU] | Freq: Three times a day (TID) | INTRAMUSCULAR | Status: DC
  Administered 2013-05-09 – 2013-05-15 (×16): 5000 [IU] via SUBCUTANEOUS
  Filled 2013-05-09 (×21): qty 1

## 2013-05-09 MED ORDER — INSULIN GLARGINE 100 UNIT/ML ~~LOC~~ SOLN
5.0000 [IU] | Freq: Every day | SUBCUTANEOUS | Status: DC
  Administered 2013-05-09 – 2013-05-14 (×7): 5 [IU] via SUBCUTANEOUS
  Filled 2013-05-09 (×9): qty 0.05

## 2013-05-09 MED ORDER — ACETAMINOPHEN 650 MG RE SUPP: 650.0000 mg | Freq: Four times a day (QID) | RECTAL | Status: DC | PRN

## 2013-05-09 MED ORDER — LATANOPROST 0.005 % OP SOLN
1.0000 [drp] | Freq: Every day | OPHTHALMIC | Status: DC
  Administered 2013-05-09 – 2013-05-14 (×7): 1 [drp] via OPHTHALMIC
  Filled 2013-05-09: qty 2.5

## 2013-05-09 MED ORDER — BRIMONIDINE TARTRATE 0.2 % OP SOLN
1.0000 [drp] | Freq: Two times a day (BID) | OPHTHALMIC | Status: DC
  Administered 2013-05-09 – 2013-05-15 (×13): 1 [drp] via OPHTHALMIC
  Filled 2013-05-09: qty 5

## 2013-05-09 MED ORDER — DOCUSATE SODIUM 100 MG PO CAPS
100.0000 mg | ORAL_CAPSULE | Freq: Every day | ORAL | Status: DC
  Administered 2013-05-09 – 2013-05-15 (×7): 100 mg via ORAL
  Filled 2013-05-09 (×8): qty 1

## 2013-05-09 MED ORDER — LABETALOL HCL 100 MG PO TABS
50.0000 mg | ORAL_TABLET | Freq: Two times a day (BID) | ORAL | Status: DC
  Administered 2013-05-09 (×2): 50 mg via ORAL
  Filled 2013-05-09 (×5): qty 0.5

## 2013-05-09 MED ORDER — RENA-VITE PO TABS
1.0000 | ORAL_TABLET | Freq: Every day | ORAL | Status: DC
  Administered 2013-05-09 – 2013-05-14 (×6): 1 via ORAL
  Filled 2013-05-09 (×11): qty 1

## 2013-05-09 MED ORDER — ACETAMINOPHEN 325 MG PO TABS: 650.0000 mg | ORAL_TABLET | Freq: Four times a day (QID) | ORAL | Status: DC | PRN

## 2013-05-09 MED ORDER — SODIUM CHLORIDE 0.9 % IJ SOLN
3.0000 mL | Freq: Two times a day (BID) | INTRAMUSCULAR | Status: DC
  Administered 2013-05-10 – 2013-05-14 (×7): 3 mL via INTRAVENOUS

## 2013-05-09 MED ORDER — TRAZODONE HCL 50 MG PO TABS
50.0000 mg | ORAL_TABLET | Freq: Every evening | ORAL | Status: DC | PRN
  Administered 2013-05-09: 02:00:00 50 mg via ORAL
  Filled 2013-05-09: qty 1

## 2013-05-09 MED ORDER — BRINZOLAMIDE 1 % OP SUSP
1.0000 [drp] | Freq: Two times a day (BID) | OPHTHALMIC | Status: DC
  Administered 2013-05-09 – 2013-05-15 (×13): 1 [drp] via OPHTHALMIC
  Filled 2013-05-09: qty 10

## 2013-05-09 SURGICAL SUPPLY — 54 items
BANDAGE ESMARK 6X9 LF (GAUZE/BANDAGES/DRESSINGS) IMPLANT
BANDAGE GAUZE ELAST BULKY 4 IN (GAUZE/BANDAGES/DRESSINGS) ×3 IMPLANT
BLADE SURG 10 STRL SS (BLADE) ×3 IMPLANT
BNDG CMPR 9X6 STRL LF SNTH (GAUZE/BANDAGES/DRESSINGS)
BNDG COHESIVE 4X5 TAN STRL (GAUZE/BANDAGES/DRESSINGS) ×3 IMPLANT
BNDG COHESIVE 6X5 TAN STRL LF (GAUZE/BANDAGES/DRESSINGS) ×3 IMPLANT
BNDG ESMARK 6X9 LF (GAUZE/BANDAGES/DRESSINGS)
BRUSH SCRUB DISP (MISCELLANEOUS) ×6 IMPLANT
CLOTH BEACON ORANGE TIMEOUT ST (SAFETY) ×3 IMPLANT
COVER MAYO STAND STRL (DRAPES) ×3 IMPLANT
COVER SURGICAL LIGHT HANDLE (MISCELLANEOUS) ×6 IMPLANT
CUFF TOURNIQUET SINGLE 34IN LL (TOURNIQUET CUFF) IMPLANT
CUFF TOURNIQUET SINGLE 44IN (TOURNIQUET CUFF) IMPLANT
DRAIN PENROSE 1/2X12 LTX STRL (WOUND CARE) IMPLANT
DRAPE C-ARMOR (DRAPES) ×3 IMPLANT
DRAPE EXTREMITY T 121X128X90 (DRAPE) ×3 IMPLANT
DRAPE PROXIMA HALF (DRAPES) ×6 IMPLANT
DRAPE U-SHAPE 47X51 STRL (DRAPES) ×3 IMPLANT
DRSG ADAPTIC 3X8 NADH LF (GAUZE/BANDAGES/DRESSINGS) ×3 IMPLANT
EVACUATOR 1/8 PVC DRAIN (DRAIN) IMPLANT
GLOVE BIO SURGEON STRL SZ7.5 (GLOVE) ×3 IMPLANT
GLOVE BIO SURGEON STRL SZ8 (GLOVE) ×3 IMPLANT
GLOVE BIOGEL PI IND STRL 7.5 (GLOVE) ×2 IMPLANT
GLOVE BIOGEL PI IND STRL 8 (GLOVE) ×2 IMPLANT
GLOVE BIOGEL PI INDICATOR 7.5 (GLOVE) ×1
GLOVE BIOGEL PI INDICATOR 8 (GLOVE) ×1
GOWN PREVENTION PLUS XLARGE (GOWN DISPOSABLE) ×3 IMPLANT
GOWN STRL NON-REIN LRG LVL3 (GOWN DISPOSABLE) ×6 IMPLANT
KIT ROOM TURNOVER OR (KITS) ×3 IMPLANT
MANIFOLD NEPTUNE II (INSTRUMENTS) ×3 IMPLANT
NS IRRIG 1000ML POUR BTL (IV SOLUTION) ×3 IMPLANT
PACK GENERAL/GYN (CUSTOM PROCEDURE TRAY) ×3 IMPLANT
PAD ARMBOARD 7.5X6 YLW CONV (MISCELLANEOUS) ×6 IMPLANT
PAD CAST 3X4 CTTN HI CHSV (CAST SUPPLIES) ×6 IMPLANT
PADDING CAST COTTON 3X4 STRL (CAST SUPPLIES) ×6
SPONGE LAP 18X18 X RAY DECT (DISPOSABLE) IMPLANT
STAPLER VISISTAT 35W (STAPLE) IMPLANT
STOCKINETTE IMPERVIOUS LG (DRAPES) IMPLANT
SUT ETHILON 2 0 PSLX (SUTURE) IMPLANT
SUT PDS AB 1 CTX 36 (SUTURE) ×6 IMPLANT
SUT PDS AB 2-0 CT1 27 (SUTURE) IMPLANT
SUT SILK 2 0 (SUTURE)
SUT SILK 2 0 SH CR/8 (SUTURE) ×3 IMPLANT
SUT SILK 2-0 18XBRD TIE 12 (SUTURE) IMPLANT
SUT VIC AB 0 CT1 27 (SUTURE) ×4
SUT VIC AB 0 CT1 27XBRD ANBCTR (SUTURE) ×4 IMPLANT
SUT VIC AB 2-0 CT1 27 (SUTURE) ×4
SUT VIC AB 2-0 CT1 TAPERPNT 27 (SUTURE) ×4 IMPLANT
SWAB COLLECTION DEVICE MRSA (MISCELLANEOUS) IMPLANT
TOWEL OR 17X24 6PK STRL BLUE (TOWEL DISPOSABLE) ×3 IMPLANT
TOWEL OR 17X26 10 PK STRL BLUE (TOWEL DISPOSABLE) ×6 IMPLANT
TUBE ANAEROBIC SPECIMEN COL (MISCELLANEOUS) IMPLANT
TUBE CONNECTING 12X1/4 (SUCTIONS) ×3 IMPLANT
WATER STERILE IRR 1000ML POUR (IV SOLUTION) ×3 IMPLANT

## 2013-05-09 NOTE — Progress Notes (Signed)
Utilization review completed.  

## 2013-05-09 NOTE — Progress Notes (Signed)
[facility]                                                                                Patient Demographics  Oscar Castillo, is a 62 y.o. male, DOB - 1951-06-15, ZOX:[id number]  Admit date - 05/08/2013   Admitting Physician Eduard Clos, MD  Outpatient Primary MD for the patient is Oliver Barre, MD  LOS - 1   Chief Complaint  Patient presents with  . toe/foot infection         Assessment & Plan    1. Left toe open wound with possible gangrene setting and likely poor circulation - at this time patient has been continued on empiric antibiotics for possible developing cellulitis. MRI of the left foot has been ordered to check for osteomyelitis. Check ABIs. Orthopedics has been consulted, Dr. Carola Frost to see the patient.    2. ESRD on hemodialysis - Tuesday Thursday and Saturday. Consulted nephrology for dialysis.    3. Hypertension - continue home medications and patient also has been placed on clear IV hydralazine for systolic blood pressure more than 160.     4. Diabetes mellitus type 2 - closely follow CBGs with sliding-scale coverage.  CBG (last 3)   Recent Labs  05/09/13 0029 05/09/13 0753  GLUCAP 124* 137*     Lab Results  Component Value Date   HGBA1C 7.8* 09/11/2012     5. Recent admission in May for GI bleed - has had extensive workup done at that time including EGD colonoscopy and capsule endoscopy. Closely follow CBC.     6. History of combined chronic systolic and diastolic CHF EF last was 45% - presently compensated. Continue beta blocker.      Code Status: Full  Family Communication:    Disposition Plan:  TBD   Procedures  MRI L Foot   Consults  Ortho, Renal   DVT Prophylaxis  Heparin   Lab Results  Component Value Date   PLT 374 05/09/2013    Medications  Scheduled Meds: . amLODipine  10 mg Oral Daily  . brimonidine  1 drop Both Eyes BID  . brinzolamide  1 drop Both Eyes BID  . calcium acetate  1,334 mg Oral  TID WC  . docusate sodium  100 mg Oral Daily  . insulin aspart  0-9 Units Subcutaneous TID WC  . insulin glargine  5 Units Subcutaneous QHS  . labetalol  50 mg Oral BID  . latanoprost  1 drop Both Eyes QHS  . piperacillin-tazobactam (ZOSYN)  IV  2.25 g Intravenous Q8H  . sodium chloride  3 mL Intravenous Q12H  . vancomycin  1,000 mg Intravenous Q T,Th,Sa-HD   Continuous Infusions:  PRN Meds:.acetaminophen, acetaminophen, hydrALAZINE, ondansetron (ZOFRAN) IV, ondansetron (ZOFRAN) IV, ondansetron, pantoprazole, traMADol, traZODone  Antibiotics     Anti-infectives   Start     Dose/Rate Route Frequency Ordered Stop   05/09/13 1200  vancomycin (VANCOCIN) IVPB 1000 mg/200 mL premix     1,000 mg 200 mL/hr over 60 Minutes Intravenous Every T-Th-Sa (Hemodialysis) 05/08/13 2156     05/08/13 2200  piperacillin-tazobactam (ZOSYN) IVPB 2.25 g     2.25 g 100 mL/hr over 30  Minutes Intravenous 3 times per day 05/08/13 2156     05/08/13 2200  vancomycin (VANCOCIN) 2,000 mg in sodium chloride 0.9 % 500 mL IVPB     2,000 mg 250 mL/hr over 120 Minutes Intravenous  Once 05/08/13 2156 05/09/13 0102       Time Spent in minutes   35   SINGH,PRASHANT K M.D on 05/09/2013 at 9:12 AM  Between 7am to 7pm - Pager - [phone]  After 7pm go to [email or web address] - password [email or web address]  And look for the night coverage person covering for me after hours  [facility] Office  [phone]    Subjective:   Anthoni Geerts today has, No headache, No chest pain, No abdominal pain - No Nausea, No new weakness tingling or numbness, No Cough - SOB. Mild L 1st toe pain  Objective:   Filed Vitals:   05/08/13 2330 05/09/13 0000 05/09/13 0023 05/09/13 0524  BP: 198/90 196/69 172/89 136/86  Pulse: 78 79 87 84  Temp:   98.5 F (36.9 C) 99.7 F (37.6 C)  TempSrc:   Oral Oral  Resp: 16 20  18   Height:   5\' 10"  (1.778 m)   Weight:   88.9 kg (195 lb 15.8 oz)   SpO2: 100% 100% 100% 100%    Wt Readings  from Last 3 Encounters:  05/09/13 88.9 kg (195 lb 15.8 oz)  05/09/13 88.9 kg (195 lb 15.8 oz)  02/20/13 90.719 kg (200 lb)     Intake/Output Summary (Last 24 hours) at 05/09/13 0912 Last data filed at 05/09/13 0425  Gross per 24 hour  Intake      0 ml  Output    150 ml  Net   -150 ml    Exam Awake Alert, Oriented X 3, No new F.N deficits, Normal affect [place].AT,PERRAL Supple Neck,No JVD, No cervical lymphadenopathy appriciated.  Symmetrical Chest wall movement, Good air movement bilaterally, CTAB RRR,No Gallops,Rubs or new Murmurs, No Parasternal Heave +ve B.Sounds, Abd Soft, Non tender, No organomegaly appriciated, No rebound - guarding or rigidity. No Cyanosis, Clubbing or edema, No new Rash or bruise,  left first toe is cyanotic, there is old blood and hematoma under the left first toe toenail, there is loss of sensation in all toes on the left foot, sensation is poor but better than the right foot,   Data Review   Micro Results Recent Results (from the past 240 hour(s))  WOUND CULTURE     Status: None   Collection Time    05/08/13 10:32 PM      Result Value Range Status   Specimen Description WOUND   Final   Special Requests LEFT GREAT TOE   Final   Gram Stain PENDING   Incomplete   Culture     Final   Value: NO GROWTH     Performed at [facility]   Report Status PENDING   Incomplete    Radiology Reports Dg Foot Complete Left  05/08/2013   CLINICAL DATA:  Infected left great toe  EXAM: LEFT FOOT - COMPLETE 3+ VIEW  COMPARISON:  None.  FINDINGS: No fracture or dislocation is seen.  Deformity at the 1st IP joint, favored to be chronic. No definite cortical regularity to suggest acute osteomyelitis.  Mild soft tissue swelling along the 1st digit.  Postsurgical changes involvement the distal tibia/fibula. The healing tibial fracture deformity. Fracture lucency remains visible.  IMPRESSION: No radiographic findings specific for acute osteomyelitis.  Deformity at the  1st  IP joint, favored to be chronic.  Healing fracture deformity involving the distal tibia/fibula, status post ORIF.   Electronically Signed   By: Charline Bills M.D.   On: 05/08/2013 17:52    CBC  Recent Labs Lab 05/08/13 1806 05/09/13 0345  WBC 7.3 7.8  HGB 12.5* 11.8*  HCT 39.5 36.7*  PLT 398 374  MCV 72.7* 72.4*  MCH 23.0* 23.3*  MCHC 31.6 32.2  RDW 25.7* 25.7*  LYMPHSABS 1.3  --   MONOABS 0.9  --   EOSABS 0.1  --   BASOSABS 0.0  --     Chemistries   Recent Labs Lab 05/08/13 1806 05/09/13 0345  NA 132* 131*  K 4.8 4.0  CL 89* 90*  CO2 28 25  GLUCOSE 139* 155*  BUN 36* 39*  CREATININE 9.27* 9.68*  CALCIUM 10.2 9.6  AST 20  --   ALT 19  --   ALKPHOS 65  --   BILITOT 0.4  --    ------------------------------------------------------------------------------------------------------------------ estimated creatinine clearance is 9 ml/min (by C-G formula based on Cr of 9.68). ------------------------------------------------------------------------------------------------------------------ No results found for this basename: HGBA1C,  in the last 72 hours ------------------------------------------------------------------------------------------------------------------ No results found for this basename: CHOL, HDL, LDLCALC, TRIG, CHOLHDL, LDLDIRECT,  in the last 72 hours ------------------------------------------------------------------------------------------------------------------ No results found for this basename: TSH, T4TOTAL, FREET3, T3FREE, THYROIDAB,  in the last 72 hours ------------------------------------------------------------------------------------------------------------------ No results found for this basename: VITAMINB12, FOLATE, FERRITIN, TIBC, IRON, RETICCTPCT,  in the last 72 hours  Coagulation profile No results found for this basename: INR, PROTIME,  in the last 168 hours  No results found for this basename: DDIMER,  in the last 72  hours  Cardiac Enzymes No results found for this basename: CK, CKMB, TROPONINI, MYOGLOBIN,  in the last 168 hours ------------------------------------------------------------------------------------------------------------------ No components found with this basename: POCBNP,

## 2013-05-09 NOTE — Progress Notes (Addendum)
Pt admitted to unit from ED. Pt is A&O w/ no c/o pain. VS stable with a slightly elevated BP. Pt has gangrenous wound on L great toe. Admitting MD paged. Pt refused to watch safety video. Pt is currently resting comfortably in bed with wife at bedside.

## 2013-05-09 NOTE — H&P (Signed)
[facility] History and Physical  [name] VHQ:[id number] DOB: March 06, 1951 DOA: 05/08/2013  Referring physician: ER physician. PCP: Oliver Barre, MD  Specialists: Nephrologist for dialysis.  Chief Complaint: Bleeding left toe wound.  HPI: Oscar Castillo is a 62 y.o. male with known history of ESRD on hemodialysis, diabetes mellitus type 2, hypertension was brought to the ER after patient had bleeding wound from the left toe. Patient sustained an injury 3 days ago after a fall at his house to his left toe. Patient's wife states that on falling on the day patient had blood from the left toe. From yesterday patient's left toe started getting black in color. And the bleeding was continuing off and on. Denies any fever chills. Patient was brought to the ER due to the persistent symptoms and darkish color of the left toe. X-rays do not reveal any active changes. Orthopedics was consulted and patient has been started empiric antibiotics for possible cellulitis and gangrene. EKG shows sinus rhythm. Patient is not in acute distress. Patient otherwise denies any chest pain or shortness of breath.   Review of Systems: As presented in the history of presenting illness, rest negative.  Past Medical History  Diagnosis Date  . ALLERGIC RHINITIS 05/05/2007  . BACK PAIN, LUMBAR, CHRONIC 08/06/2009  . BENIGN PROSTATIC HYPERTROPHY 08/01/2010  . CEREBROVASCULAR ACCIDENT, HX OF 08/06/2009  . CHOLELITHIASIS 08/01/2010  . CONGESTIVE HEART FAILURE 03/18/2009  . DEPRESSION 03/18/2009  . DIABETES MELLITUS, TYPE II 03/25/2007  . ERECTILE DYSFUNCTION 03/25/2007  . GERD 03/25/2007  . HEPATITIS C, HX OF 03/25/2007  . HYPERTENSION 03/25/2007  . Morbid obesity 03/25/2007  . NEPHROLITHIASIS, HX OF 03/18/2009  . ESRD on hemodialysis 09/08/2012    ESRD due to DM/HTN. Started dialysis in November 2013. Has LUA AVF placed Jun 14 2012 which is maturing as of Feb 2014.   [name] Renal insufficiency    Past Surgical History  Procedure  Laterality Date  . Nephrectomy      partial RR  . Av fistula placement  06/14/2012    Procedure: ARTERIOVENOUS (AV) FISTULA CREATION;  Surgeon: Chuck Hint, MD;  Location[facility] OR;  Service: Vascular;  Laterality: Left;  Left basilic vein transposition with fistula.  . Tibia im nail insertion Left 09/09/2012    Procedure: INTRAMEDULLARY (IM) NAIL TIBIAL;  Surgeon: Eulas Post, MD;  Location: [facility] OR;  Service: Orthopedics;  Laterality: Left;  left tibial nail and open reduction internal fixation left fibula fracture  . Orif fibula fracture Left 09/09/2012    Procedure: OPEN REDUCTION INTERNAL FIXATION (ORIF) FIBULA FRACTURE;  Surgeon: Eulas Post, MD;  Location: [facility] OR;  Service: Orthopedics;  Laterality: Left;  . Colonoscopy N/A 10/28/2012    Procedure: COLONOSCOPY;  Surgeon: Petra Kuba, MD;  Location[facility] ENDOSCOPY;  Service: Endoscopy;  Laterality: N/A;  . Esophagogastroduodenoscopy N/A 11/02/2012    Procedure: ESOPHAGOGASTRODUODENOSCOPY (EGD);  Surgeon: Florencia Reasons, MD;  Location: [facility] ENDOSCOPY;  Service: Endoscopy;  Laterality: N/A;  . Colonoscopy N/A 11/02/2012    Procedure: COLONOSCOPY;  Surgeon: Florencia Reasons, MD;  Location: [facility] ENDOSCOPY;  Service: Endoscopy;  Laterality: N/A;  . Colonoscopy N/A 11/03/2012    Procedure: COLONOSCOPY;  Surgeon: Florencia Reasons, MD;  Location: [facility] ENDOSCOPY;  Service: Endoscopy;  Laterality: N/A;  . Givens capsule study N/A 11/04/2012    Procedure: GIVENS CAPSULE STUDY;  Surgeon: Florencia Reasons, MD;  Location: [facility] ENDOSCOPY;  Service: Endoscopy;  Laterality: N/A;  . Enteroscopy N/A 11/08/2012  Procedure: ENTEROSCOPY;  Surgeon: Graylin Shiver, MD;  Location: [facility] ENDOSCOPY;  Service: Endoscopy;  Laterality: N/A;   Social History:  reports that he has never smoked. He does not have any smokeless tobacco history on file. He reports that he does not drink alcohol or use illicit drugs. Where does patient live home. Can patient participate in ADLs?  Yes.  No Known Allergies  Family History:  Family History  Problem Relation Age of Onset  . Coronary artery disease Other   . Diabetes Other       Prior to Admission medications   Medication Sig Start Date End Date Taking? Authorizing Provider  amLODipine (NORVASC) 10 MG tablet Take 10 mg by mouth daily.   Yes Historical Provider, MD  aspirin EC 81 MG tablet Take 1 tablet (81 mg total) by mouth daily. 02/20/13  Yes Corwin Levins, MD  Brinzolamide-Brimonidine [facility]) 1-0.2 % SUSP Place 1 drop into the right eye 2 (two) times daily.   Yes Historical Provider, MD  calcium acetate (PHOSLO) 667 MG capsule Take 2 capsules (1,334 mg total) by mouth 3 (three) times daily with meals. 06/21/12  Yes Sorin Luanne Bras, MD  docusate sodium (COLACE) 100 MG capsule Take 100 mg by mouth daily.   Yes Historical Provider, MD  Insulin Glargine (LANTUS SOLOSTAR [place]) Inject 5 Units into the skin every evening.   Yes Historical Provider, MD  labetalol (NORMODYNE) 100 MG tablet Take 50 mg by mouth 2 (two) times daily.   Yes Historical Provider, MD  pantoprazole (PROTONIX) 40 MG tablet Take 40 mg by mouth daily as needed (indigestion).   Yes Historical Provider, MD  traMADol (ULTRAM) 50 MG tablet Take 50 mg by mouth every 8 (eight) hours as needed for pain.   Yes Historical Provider, MD  Travoprost, BAK Free, (TRAVATAN) 0.004 % SOLN ophthalmic solution Place 1 drop into both eyes at bedtime. 10/30/12  Yes Belkys A Regalado, MD  traZODone (DESYREL) 50 MG tablet Take 50 mg by mouth at bedtime as needed for sleep.   Yes Historical Provider, MD    Physical Exam: Filed Vitals:   05/08/13 2300 05/08/13 2330 05/09/13 0000 05/09/13 0023  BP: 190/102 198/90 196/69 172/89  Pulse: 78 78 79 87  Temp:    98.5 F (36.9 C)  TempSrc:    Oral  Resp: 19 16 20    Height:    5\' 10"  (1.778 m)  Weight:    88.9 kg (195 lb 15.8 oz)  SpO2: 99% 100% 100% 100%     General:  Well-developed and nourished.  Eyes: Anicteric no  pallor.  ENT: No discharge from ears eyes nose mouth.  Neck: No mass felt.  Cardiovascular: S1-S2 heard.  Respiratory: No rhonchi or crepitations.  Abdomen: Soft nontender bowel sounds present.  Skin: Left grade toe has gangrenous looking distal toe with no active discharge at this time.  Musculoskeletal: Patient has poor pulses peripherally.  Psychiatric: Appears normal.  Neurologic: Alert awake oriented to time place and person. Moves all extremities.  Labs on Admission:  Basic Metabolic Panel:  Recent Labs Lab 05/08/13 1806  NA 132*  K 4.8  CL 89*  CO2 28  GLUCOSE 139*  BUN 36*  CREATININE 9.27*  CALCIUM 10.2   Liver Function Tests:  Recent Labs Lab 05/08/13 1806  AST 20  ALT 19  ALKPHOS 65  BILITOT 0.4  PROT 9.4*  ALBUMIN 3.3*   No results found for this basename: LIPASE, AMYLASE,  in the last  168 hours No results found for this basename: AMMONIA,  in the last 168 hours CBC:  Recent Labs Lab 05/08/13 1806  WBC 7.3  NEUTROABS 5.0  HGB 12.5*  HCT 39.5  MCV 72.7*  PLT 398   Cardiac Enzymes: No results found for this basename: CKTOTAL, CKMB, CKMBINDEX, TROPONINI,  in the last 168 hours  BNP (last 3 results)  Recent Labs  06/11/12 0916  PROBNP 6937.0*   CBG:  Recent Labs Lab 05/09/13 0029  GLUCAP 124*    Radiological Exams on Admission: Dg Foot Complete Left  05/08/2013   CLINICAL DATA:  Infected left great toe  EXAM: LEFT FOOT - COMPLETE 3+ VIEW  COMPARISON:  None.  FINDINGS: No fracture or dislocation is seen.  Deformity at the 1st IP joint, favored to be chronic. No definite cortical regularity to suggest acute osteomyelitis.  Mild soft tissue swelling along the 1st digit.  Postsurgical changes involvement the distal tibia/fibula. The healing tibial fracture deformity. Fracture lucency remains visible.  IMPRESSION: No radiographic findings specific for acute osteomyelitis.  Deformity at the 1st IP joint, favored to be chronic.   Healing fracture deformity involving the distal tibia/fibula, status post ORIF.   Electronically Signed   By: Charline Bills M.D.   On: 05/08/2013 17:52    EKG: Independently reviewed. Normal sinus rhythm with nonspecific ST-T changes.  Assessment/Plan Principal Problem:   Open toe wound Active Problems:   HYPERTENSION   ESRD on dialysis   Diabetes mellitus   1. Left toe open wound with possible gangrene setting in - at this time patient has been placed on empiric antibiotics for possible developing cellulitis. MRI of the left foot has been ordered to check for osteomyelitis. Check ABIs. Orthopedics has been consulted. 2. ESRD on hemodialysis - Tuesday Thursday and Saturday. Consult nephrology for dialysis. 3. Hypertension - continue home medications and patient also has been placed on clear IV hydralazine for systolic blood pressure more than 160. 4. Diabetes mellitus type 2 - closely follow CBGs with sliding-scale coverage. 5. Recent admission in May for GI bleed - has had extensive workup done at that time including EGD colonoscopy and capsule endoscopy. Closely follow CBC. 6. History of combined CHF EF last was 45% - presently not in respiratory distress.    Code Status: Full code.  Family Communication: Patient wife at the bedside.  Disposition Plan: Admit to inpatient.    Jordi Lacko N. [facility] Pager [phone].  If 7PM-7AM, please contact night-coverage [email or web address] Password [email or web address] 05/09/2013, 1:12 AM

## 2013-05-09 NOTE — ED Provider Notes (Signed)
Medical screening examination/treatment/procedure(s) were conducted as a shared visit with non-physician practitioner(s) and myself.  I personally evaluated the patient during the encounter  I interviewed and examined the patient. Lungs are CTAB. Cardiac exam wnl. Abdomen soft.  Left toe w/ foul small, necrotic appearing. Discussed w/ ortho, will admit to hospitalist.   Junius Argyle, MD 05/09/13 [phone]

## 2013-05-09 NOTE — Progress Notes (Signed)
VASCULAR LAB PRELIMINARY  ARTERIAL  ABI completed:    RIGHT    LEFT    PRESSURE WAVEFORM  PRESSURE WAVEFORM  BRACHIAL 166 Triphasic BRACHIAL Restricted limb dialysis fistula Triphasic  DP 97 Dampened Monophasic DP >300 Dampened Monophasic  PT >300 Dampened Monophasic PT >300 Dampened Monophasic    RIGHT LEFT  ABI N/A N/A   ABIs were not ascertained secondary to incompressible vessels. Probably due to calcification  Oscar Castillo, RVS 05/09/2013, 11:39 AM

## 2013-05-09 NOTE — Progress Notes (Signed)
INITIAL NUTRITION ASSESSMENT  DOCUMENTATION CODES Per approved criteria  -Not Applicable   INTERVENTION: Recommend addition of renal multivitamin. RD to continue to follow nutrition care plan and make additional recommendations accordingly.  NUTRITION DIAGNOSIS: Increased nutrient needs related to toe wound and HD as evidenced by estimated needs.   Goal: Intake to meet >90% of estimated nutrition needs.  Monitor:  weight trends, lab trends, I/O's, PO intake, supplement tolerance  Reason for Assessment: Malnutrition Screening Tool  62 y.o. male  Admitting Dx: Open toe wound  ASSESSMENT: PMHx significant for ESRD on HD, DM2, HTN. Admitted with toe injury with bleeding. Work-up reveals possible gangrene.  Pt with 5% wt loss x 6 months - this is not significant. Pt reports that his appetite is good. RD saw pt's breakfast tray, pt consumed at least 90% of it. Pt appears well nourished. States that he doesn't follow a renal diet at home or take any nutritional supplements at baseline.  Potassium is currently WNL. No phosphorus is available.  Height: Ht Readings from Last 1 Encounters:  05/09/13 5\' 10"  (1.778 m)    Weight: Wt Readings from Last 1 Encounters:  05/09/13 195 lb 15.8 oz (88.9 kg)    Ideal Body Weight: 166 lb  % Ideal Body Weight: 117%  Wt Readings from Last 10 Encounters:  05/09/13 195 lb 15.8 oz (88.9 kg)  05/09/13 195 lb 15.8 oz (88.9 kg)  02/20/13 200 lb (90.719 kg)  11/12/12 207 lb 14.3 oz (94.3 kg)  11/12/12 207 lb 14.3 oz (94.3 kg)  11/12/12 207 lb 14.3 oz (94.3 kg)  11/12/12 207 lb 14.3 oz (94.3 kg)  11/12/12 207 lb 14.3 oz (94.3 kg)  11/12/12 207 lb 14.3 oz (94.3 kg)  10/29/12 204 lb 5.9 oz (92.701 kg)    Usual Body Weight: 204 - 207 lb  % Usual Body Weight: 95%  BMI:  Body mass index is 28.12 kg/(m^2). Overweight  Estimated Nutritional Needs: Kcal: 1900 - 2100 Protein: at least 105 grams protein daily Fluid: 1.2 liters daily  Skin:  L toe wound  Diet Order: Carb Control Medium  EDUCATION NEEDS: -No education needs identified at this time   Intake/Output Summary (Last 24 hours) at 05/09/13 0839 Last data filed at 05/09/13 0425  Gross per 24 hour  Intake      0 ml  Output    150 ml  Net   -150 ml    Last BM: 10/11  Labs:   Recent Labs Lab 05/08/13 1806 05/09/13 0345  NA 132* 131*  K 4.8 4.0  CL 89* 90*  CO2 28 25  BUN 36* 39*  CREATININE 9.27* 9.68*  CALCIUM 10.2 9.6  GLUCOSE 139* 155*    CBG (last 3)   Recent Labs  05/09/13 0029 05/09/13 0753  GLUCAP 124* 137*    Scheduled Meds: . amLODipine  10 mg Oral Daily  . brimonidine  1 drop Both Eyes BID  . brinzolamide  1 drop Both Eyes BID  . calcium acetate  1,334 mg Oral TID WC  . docusate sodium  100 mg Oral Daily  . insulin aspart  0-9 Units Subcutaneous TID WC  . insulin glargine  5 Units Subcutaneous QHS  . labetalol  50 mg Oral BID  . latanoprost  1 drop Both Eyes QHS  . piperacillin-tazobactam (ZOSYN)  IV  2.25 g Intravenous Q8H  . sodium chloride  3 mL Intravenous Q12H  . vancomycin  1,000 mg Intravenous Q T,Th,Sa-HD    Continuous  Infusions:   Past Medical History  Diagnosis Date  . ALLERGIC RHINITIS 05/05/2007  . BACK PAIN, LUMBAR, CHRONIC 08/06/2009  . BENIGN PROSTATIC HYPERTROPHY 08/01/2010  . CEREBROVASCULAR ACCIDENT, HX OF 08/06/2009  . CHOLELITHIASIS 08/01/2010  . CONGESTIVE HEART FAILURE 03/18/2009  . DEPRESSION 03/18/2009  . DIABETES MELLITUS, TYPE II 03/25/2007  . ERECTILE DYSFUNCTION 03/25/2007  . GERD 03/25/2007  . HEPATITIS C, HX OF 03/25/2007  . HYPERTENSION 03/25/2007  . Morbid obesity 03/25/2007  . NEPHROLITHIASIS, HX OF 03/18/2009  . ESRD on hemodialysis 09/08/2012    ESRD due to DM/HTN. Started dialysis in November 2013. Has LUA AVF placed Jun 14 2012 which is maturing as of Feb 2014.   [name] Renal insufficiency     Past Surgical History  Procedure Laterality Date  . Nephrectomy      partial RR  . Av fistula  placement  06/14/2012    Procedure: ARTERIOVENOUS (AV) FISTULA CREATION;  Surgeon: Chuck Hint, MD;  Location: [facility] OR;  Service: Vascular;  Laterality: Left;  Left basilic vein transposition with fistula.  . Tibia im nail insertion Left 09/09/2012    Procedure: INTRAMEDULLARY (IM) NAIL TIBIAL;  Surgeon: Eulas Post, MD;  Location: [facility] OR;  Service: Orthopedics;  Laterality: Left;  left tibial nail and open reduction internal fixation left fibula fracture  . Orif fibula fracture Left 09/09/2012    Procedure: OPEN REDUCTION INTERNAL FIXATION (ORIF) FIBULA FRACTURE;  Surgeon: Eulas Post, MD;  Location: [facility] OR;  Service: Orthopedics;  Laterality: Left;  . Colonoscopy N/A 10/28/2012    Procedure: COLONOSCOPY;  Surgeon: Petra Kuba, MD;  Location: [facility] ENDOSCOPY;  Service: Endoscopy;  Laterality: N/A;  . Esophagogastroduodenoscopy N/A 11/02/2012    Procedure: ESOPHAGOGASTRODUODENOSCOPY (EGD);  Surgeon: Florencia Reasons, MD;  Location: [facility] ENDOSCOPY;  Service: Endoscopy;  Laterality: N/A;  . Colonoscopy N/A 11/02/2012    Procedure: COLONOSCOPY;  Surgeon: Florencia Reasons, MD;  Location: [facility] ENDOSCOPY;  Service: Endoscopy;  Laterality: N/A;  . Colonoscopy N/A 11/03/2012    Procedure: COLONOSCOPY;  Surgeon: Florencia Reasons, MD;  Location: [facility] ENDOSCOPY;  Service: Endoscopy;  Laterality: N/A;  . Givens capsule study N/A 11/04/2012    Procedure: GIVENS CAPSULE STUDY;  Surgeon: Florencia Reasons, MD;  Location: [facility] ENDOSCOPY;  Service: Endoscopy;  Laterality: N/A;  . Enteroscopy N/A 11/08/2012    Procedure: ENTEROSCOPY;  Surgeon: Graylin Shiver, MD;  Location: [facility] ENDOSCOPY;  Service: Endoscopy;  Laterality: N/A;    Jarold Motto MS, RD, LDN Pager: [phone] After-hours pager: [phone]

## 2013-05-09 NOTE — Clinical Documentation Improvement (Signed)
THIS DOCUMENT IS NOT A PERMANENT PART OF THE MEDICAL RECORD  Please update your documentation with the medical record to reflect your response to this query. If you need help knowing how to do this please call [phone].  05/09/13  Dear Dr. [facility],  In a better effort to capture your patient's severity of illness, reflect appropriate length of stay and utilization of resources, a review of the patient medical record has revealed the following indicators the diagnosis of Heart Failure.    Based on your clinical judgment, please clarify and document  CHF TYPE AND ACUITY in a progress note and/or discharge summary the clinical condition associated with the following supporting information:  In responding to this query please exercise your independent judgment.  The fact that a query is asked, does not imply that any particular answer is desired or expected.  Possible Clinical Conditions?  Chronic Systolic Congestive Heart Failure Chronic Diastolic Congestive Heart Failure Chronic Systolic & Diastolic Congestive Heart Failure Acute Systolic Congestive Heart Failure Acute Diastolic Congestive Heart Failure Acute Systolic & Diastolic Congestive Heart Failure Acute on Chronic Systolic Congestive Heart Failure Acute on Chronic Diastolic Congestive Heart Failure Acute on Chronic Systolic & Diastolic  Congestive Heart Failure Other Condition Cannot Clinically Determine  Supporting Information:  Signs & Symptoms:(As per notes) "Pt has HX of CHF"   Reviewed:  no additional documentation provided  Please address to the author of the note.   Thank You,  Nevin Bloodgood RN, BSN, CCDS  Clinical Documentation Specialist: [phone] [phone] Health Information Management [place]

## 2013-05-09 NOTE — Consult Note (Signed)
Oscar Castillo Renal Consultation Note    Indication for Consultation:  Management of ESRD/hemodialysis; anemia, hypertension/volume and secondary hyperparathyroidism  HPI: Oscar Castillo is a 62 y.o. male ESRD patient with past medical history significant for DM Type II and hypertension who presented to the ED with complaints of a bleeding left great toe sustained after tripping on the carpet in his home last Friday. While the bleeding was intermittent, the patient and his wife became concerned when the toe began to turn black. At the time of this encounter, he is resting in bed and has no complaints. He denies pain, fever, drainage or further bleeding from the site. Plain films show no acute process however ortho has been consulted to further evaluate the possibility of underlying osteomyelitis. MRI is still pending.  Last hemodialysis session was on Saturday 10/11 as scheduled. Per outpatient records, there were no issues with treatment per se although the patient does tend to sign off frequently.  Past Medical History  Diagnosis Date  . ALLERGIC RHINITIS 05/05/2007  . BACK PAIN, LUMBAR, CHRONIC 08/06/2009  . BENIGN PROSTATIC HYPERTROPHY 08/01/2010  . CEREBROVASCULAR ACCIDENT, HX OF 08/06/2009  . CHOLELITHIASIS 08/01/2010  . CONGESTIVE HEART FAILURE 03/18/2009  . DEPRESSION 03/18/2009  . DIABETES MELLITUS, TYPE II 03/25/2007  . ERECTILE DYSFUNCTION 03/25/2007  . GERD 03/25/2007  . HEPATITIS C, HX OF 03/25/2007  . HYPERTENSION 03/25/2007  . Morbid obesity 03/25/2007  . NEPHROLITHIASIS, HX OF 03/18/2009  . ESRD on hemodialysis 09/08/2012    ESRD due to DM/HTN. Started dialysis in November 2013. Has LUA AVF placed Jun 14 2012 which is maturing as of Feb 2014.   [name] Renal insufficiency    Past Surgical History  Procedure Laterality Date  . Nephrectomy      partial RR  . Av fistula placement  06/14/2012    Procedure: ARTERIOVENOUS (AV) FISTULA CREATION;  Surgeon: Chuck Hint, MD;   Location: [facility] OR;  Service: Vascular;  Laterality: Left;  Left basilic vein transposition with fistula.  . Tibia im nail insertion Left 09/09/2012    Procedure: INTRAMEDULLARY (IM) NAIL TIBIAL;  Surgeon: Eulas Post, MD;  Location: [facility] OR;  Service: Orthopedics;  Laterality: Left;  left tibial nail and open reduction internal fixation left fibula fracture  . Orif fibula fracture Left 09/09/2012    Procedure: OPEN REDUCTION INTERNAL FIXATION (ORIF) FIBULA FRACTURE;  Surgeon: Eulas Post, MD;  Location: [facility] OR;  Service: Orthopedics;  Laterality: Left;  . Colonoscopy N/A 10/28/2012    Procedure: COLONOSCOPY;  Surgeon: Petra Kuba, MD;  Location: [facility] ENDOSCOPY;  Service: Endoscopy;  Laterality: N/A;  . Esophagogastroduodenoscopy N/A 11/02/2012    Procedure: ESOPHAGOGASTRODUODENOSCOPY (EGD);  Surgeon: Florencia Reasons, MD;  Location: [facility] ENDOSCOPY;  Service: Endoscopy;  Laterality: N/A;  . Colonoscopy N/A 11/02/2012    Procedure: COLONOSCOPY;  Surgeon: Florencia Reasons, MD;  Location: [facility] ENDOSCOPY;  Service: Endoscopy;  Laterality: N/A;  . Colonoscopy N/A 11/03/2012    Procedure: COLONOSCOPY;  Surgeon: Florencia Reasons, MD;  Location: [facility] ENDOSCOPY;  Service: Endoscopy;  Laterality: N/A;  . Givens capsule study N/A 11/04/2012    Procedure: GIVENS CAPSULE STUDY;  Surgeon: Florencia Reasons, MD;  Location: [facility] ENDOSCOPY;  Service: Endoscopy;  Laterality: N/A;  . Enteroscopy N/A 11/08/2012    Procedure: ENTEROSCOPY;  Surgeon: Graylin Shiver, MD;  Location: [facility] ENDOSCOPY;  Service: Endoscopy;  Laterality: N/A;   Family History  Problem Relation Age of Onset  . Coronary artery disease  Other   . Diabetes Other    Social History:  reports that he has never smoked. He does not have any smokeless tobacco history on file. He reports that he does not drink alcohol or use illicit drugs. No Known Allergies Prior to Admission medications   Medication Sig Start Date End Date Taking? Authorizing Provider  amLODipine  (NORVASC) 10 MG tablet Take 10 mg by mouth daily.   Yes Historical Provider, MD  aspirin EC 81 MG tablet Take 1 tablet (81 mg total) by mouth daily. 02/20/13  Yes Corwin Levins, MD  Brinzolamide-Brimonidine [facility]) 1-0.2 % SUSP Place 1 drop into the right eye 2 (two) times daily.   Yes Historical Provider, MD  calcium acetate (PHOSLO) 667 MG capsule Take 2 capsules (1,334 mg total) by mouth 3 (three) times daily with meals. 06/21/12  Yes Sorin Luanne Bras, MD  docusate sodium (COLACE) 100 MG capsule Take 100 mg by mouth daily.   Yes Historical Provider, MD  Insulin Glargine (LANTUS SOLOSTAR Page) Inject 5 Units into the skin every evening.   Yes Historical Provider, MD  labetalol (NORMODYNE) 100 MG tablet Take 50 mg by mouth 2 (two) times daily.   Yes Historical Provider, MD  pantoprazole (PROTONIX) 40 MG tablet Take 40 mg by mouth daily as needed (indigestion).   Yes Historical Provider, MD  traMADol (ULTRAM) 50 MG tablet Take 50 mg by mouth every 8 (eight) hours as needed for pain.   Yes Historical Provider, MD  Travoprost, BAK Free, (TRAVATAN) 0.004 % SOLN ophthalmic solution Place 1 drop into both eyes at bedtime. 10/30/12  Yes Belkys A Regalado, MD  traZODone (DESYREL) 50 MG tablet Take 50 mg by mouth at bedtime as needed for sleep.   Yes Historical Provider, MD   Current Facility-Administered Medications  Medication Dose Route Frequency Provider Last Rate Last Dose  . acetaminophen (TYLENOL) tablet 650 mg  650 mg Oral Q6H PRN Eduard Clos, MD       Or  . acetaminophen (TYLENOL) suppository 650 mg  650 mg Rectal Q6H PRN Eduard Clos, MD      . amLODipine (NORVASC) tablet 10 mg  10 mg Oral Daily Eduard Clos, MD      . brimonidine (ALPHAGAN) 0.2 % ophthalmic solution 1 drop  1 drop Both Eyes BID Eduard Clos, MD      . brinzolamide (AZOPT) 1 % ophthalmic suspension 1 drop  1 drop Both Eyes BID Eduard Clos, MD      . calcium acetate (PHOSLO) capsule 1,334 mg  1,334  mg Oral TID WC Eduard Clos, MD   1,334 mg at 05/09/13 0830  . docusate sodium (COLACE) capsule 100 mg  100 mg Oral Daily Eduard Clos, MD      . heparin injection 5,000 Units  5,000 Units Subcutaneous Q8H Leroy Sea, MD      . hydrALAZINE (APRESOLINE) injection 10 mg  10 mg Intravenous Q4H PRN Eduard Clos, MD   10 mg at 05/08/13 2345  . insulin aspart (novoLOG) injection 0-9 Units  0-9 Units Subcutaneous TID WC Eduard Clos, MD   1 Units at 05/09/13 0830  . insulin glargine (LANTUS) injection 5 Units  5 Units Subcutaneous QHS Eduard Clos, MD   5 Units at 05/09/13 0209  . labetalol (NORMODYNE) tablet 50 mg  50 mg Oral BID Eduard Clos, MD   50 mg at 05/09/13 0208  . latanoprost (XALATAN) 0.005 %  ophthalmic solution 1 drop  1 drop Both Eyes QHS Eduard Clos, MD   1 drop at 05/09/13 0207  . multivitamin (RENA-VIT) tablet 1 tablet  1 tablet Oral QHS Kerin Salen, PA-C      . ondansetron [facility]) injection 4 mg  4 mg Intravenous Q8H PRN Nicole Pisciotta, PA-C      . ondansetron (ZOFRAN) tablet 4 mg  4 mg Oral Q6H PRN Eduard Clos, MD       Or  . ondansetron [facility]) injection 4 mg  4 mg Intravenous Q6H PRN Eduard Clos, MD      . pantoprazole (PROTONIX) EC tablet 40 mg  40 mg Oral Daily PRN Eduard Clos, MD      . piperacillin-tazobactam (ZOSYN) IVPB 2.25 g  2.25 g Intravenous Q8H Hilario Quarry [place], RPH 100 mL/hr at 05/09/13 0646 2.25 g at 05/09/13 0646  . sodium chloride 0.9 % injection 3 mL  3 mL Intravenous Q12H Eduard Clos, MD      . traMADol Janean Sark) tablet 50 mg  50 mg Oral Q8H PRN Eduard Clos, MD      . traZODone (DESYREL) tablet 50 mg  50 mg Oral QHS PRN Eduard Clos, MD   50 mg at 05/09/13 0207  . vancomycin (VANCOCIN) IVPB 1000 mg/200 mL premix  1,000 mg Intravenous Q T,Th,Sa-HD Hilario Quarry [place], [facility]       Labs: Basic Metabolic Panel:  Recent Labs Lab 05/08/13 1806 05/09/13 0345   NA 132* 131*  K 4.8 4.0  CL 89* 90*  CO2 28 25  GLUCOSE 139* 155*  BUN 36* 39*  CREATININE 9.27* 9.68*  CALCIUM 10.2 9.6   Liver Function Tests:  Recent Labs Lab 05/08/13 1806  AST 20  ALT 19  ALKPHOS 65  BILITOT 0.4  PROT 9.4*  ALBUMIN 3.3*   CBC:  Recent Labs Lab 05/08/13 1806 05/09/13 0345  WBC 7.3 7.8  NEUTROABS 5.0  --   HGB 12.5* 11.8*  HCT 39.5 36.7*  MCV 72.7* 72.4*  PLT 398 374   CBG:  Recent Labs Lab 05/09/13 0029 05/09/13 0753  GLUCAP 124* 137*   Studies/Results: Dg Foot Complete Left  05/08/2013   CLINICAL DATA:  Infected left great toe  EXAM: LEFT FOOT - COMPLETE 3+ VIEW  COMPARISON:  None.  FINDINGS: No fracture or dislocation is seen.  Deformity at the 1st IP joint, favored to be chronic. No definite cortical regularity to suggest acute osteomyelitis.  Mild soft tissue swelling along the 1st digit.  Postsurgical changes involvement the distal tibia/fibula. The healing tibial fracture deformity. Fracture lucency remains visible.  IMPRESSION: No radiographic findings specific for acute osteomyelitis.  Deformity at the 1st IP joint, favored to be chronic.  Healing fracture deformity involving the distal tibia/fibula, status post ORIF.   Electronically Signed   By: Charline Bills M.D.   On: 05/08/2013 17:52    ROS: Dark left great toe. 10 pt ROS asked and answered, all other systems negative except as above.   Physical Exam: Filed Vitals:   05/08/13 2330 05/09/13 0000 05/09/13 0023 05/09/13 0524  BP: 198/90 196/69 172/89 136/86  Pulse: 78 79 87 84  Temp:   98.5 F (36.9 C) 99.7 F (37.6 C)  TempSrc:   Oral Oral  Resp: 16 20  18   Height:   5\' 10"  (1.778 m)   Weight:   88.9 kg (195 lb 15.8 oz)   SpO2: 100% 100% 100% 100%  General: Elderly AAM, cooperative, in no acute distress. Head: Normocephalic, atraumatic, sclera non-icteric, mucus membranes are moist Neck: Supple. JVD not elevated. Lungs: Clear bilaterally to auscultation  without wheezes, rales, or rhonchi. Breathing is unlabored. Heart: RRR with S1 S2. No murmurs, rubs, or gallops appreciated. Abdomen: Soft, non-tender, non-distended with normoactive bowel sounds. No rebound/guarding. No obvious abdominal masses. M-S:  Strength and tone appear normal for age. Lower extremities: thickened nails bilat, L great toe with dislodged nail, old blood and ischemic changes. SCDs in place. No edema noted  Neuro: Alert and oriented X 3. Moves all extremities spontaneously. Psych:  Responds to questions appropriately with a flat affect. Dialysis Access: LUA AVF + thrill/bruit  Dialysis Orders: Center: [place]  on TTS . EDW 90 kg HD Bath 2K/2Ca Time 4:15 Heparin 5000 bolus/ 2400 mid. Access L AVF BFR 450 DFR A1.5 (no NA modeling)    Hectorol 2 mcg IV/HD Epogen 0   Units IV/HD  Venofer  0 Recent Labs: Hgb 12.3, P 5.0. Last Tsat 21%,   Assessment/Plan: 1. Left great toe open wound/ Possible gangrene/ Poor circulation - Per primary. No fx or dislocation. Ortho consulted. MRI and ABI's pending . On empiric vanc/zosyn per pharmacy./ 2. ESRD -  Cont TTS, K 4.0, HD today 3. Hypertension/volume  - SBPs 130s on amlodipine and labetalol. No overt volume excess. Gets within 1 kg of EDW as outpatient. UF as tolerated. 4. Anemia  - Hgb 11.8 < 12.3 as op. Epo recently held. No IV Fe. Follow CBC. Resume ESA as needed. 5. Metabolic bone disease -  Ca 9.6 ( 10.2 Corrected). Last P 5.0. Last PTH 180 in August. Continue binders, Vit D and low Ca bath  6. Nutrition - Alb 3.3 - Change to renal diet, add multivitamin 7. DM - on Lantus + SS coverage per primary 8. Hx GI Bleed - Per primary, recent admission in May + extensive work-up. Follow CBC  Claud Kelp, PA-C [facility] Pager [phone] 05/09/2013, 9:18 AM   I have seen and examined this patient and agree with plan per Claud Kelp.  62yo BM with ESRD on HD TTS at [facility] and admitted with traumatic injury to Lt great toe.   Toe is black/blue with very loose toe nail. Only time will tell if this is salvageable. Meds reviewed and renal meds ordered. Plan HD today. Katlynn Naser T,MD 05/09/2013 11:53 AM

## 2013-05-10 ENCOUNTER — Ambulatory Visit: Payer: Self-pay

## 2013-05-10 ENCOUNTER — Other Ambulatory Visit: Payer: Self-pay | Admitting: Oncology

## 2013-05-10 ENCOUNTER — Inpatient Hospital Stay (HOSPITAL_COMMUNITY): Payer: Medicare Other

## 2013-05-10 DIAGNOSIS — I5042 Chronic combined systolic (congestive) and diastolic (congestive) heart failure: Secondary | ICD-10-CM

## 2013-05-10 DIAGNOSIS — I96 Gangrene, not elsewhere classified: Secondary | ICD-10-CM

## 2013-05-10 LAB — GLUCOSE, CAPILLARY
Glucose-Capillary: 140 mg/dL — ABNORMAL HIGH (ref 70–99)
Glucose-Capillary: 241 mg/dL — ABNORMAL HIGH (ref 70–99)

## 2013-05-10 MED ORDER — CLONIDINE HCL 0.1 MG PO TABS
0.1000 mg | ORAL_TABLET | ORAL | Status: DC | PRN
  Filled 2013-05-10 (×2): qty 1

## 2013-05-10 MED ORDER — CHLORHEXIDINE GLUCONATE 4 % EX LIQD
60.0000 mL | Freq: Once | CUTANEOUS | Status: DC
  Filled 2013-05-10: qty 60

## 2013-05-10 MED ORDER — LABETALOL HCL 200 MG PO TABS
200.0000 mg | ORAL_TABLET | Freq: Two times a day (BID) | ORAL | Status: DC
  Administered 2013-05-10 (×2): 200 mg via ORAL
  Filled 2013-05-10 (×4): qty 1

## 2013-05-10 NOTE — Consult Note (Signed)
Reason for Consult: Gangrene left great toe Referring Physician: [name] is an 62 y.o. male.  HPI: Patient is a 62 year old gentleman who presents with necrosis of the left great toe. Patient currently started on IV antibiotics for the infection.  Past Medical History  Diagnosis Date  . ALLERGIC RHINITIS 05/05/2007  . BACK PAIN, LUMBAR, CHRONIC 08/06/2009  . BENIGN PROSTATIC HYPERTROPHY 08/01/2010  . CEREBROVASCULAR ACCIDENT, HX OF 08/06/2009  . CHOLELITHIASIS 08/01/2010  . CONGESTIVE HEART FAILURE 03/18/2009  . DEPRESSION 03/18/2009  . DIABETES MELLITUS, TYPE II 03/25/2007  . ERECTILE DYSFUNCTION 03/25/2007  . GERD 03/25/2007  . HEPATITIS C, HX OF 03/25/2007  . HYPERTENSION 03/25/2007  . Morbid obesity 03/25/2007  . NEPHROLITHIASIS, HX OF 03/18/2009  . ESRD on hemodialysis 09/08/2012    ESRD due to DM/HTN. Started dialysis in November 2013. Has LUA AVF placed Jun 14 2012 which is maturing as of Feb 2014.   [name] Renal insufficiency     Past Surgical History  Procedure Laterality Date  . Nephrectomy      partial RR  . Av fistula placement  06/14/2012    Procedure: ARTERIOVENOUS (AV) FISTULA CREATION;  Surgeon: Chuck Hint, MD;  Location: [facility] OR;  Service: Vascular;  Laterality: Left;  Left basilic vein transposition with fistula.  . Tibia im nail insertion Left 09/09/2012    Procedure: INTRAMEDULLARY (IM) NAIL TIBIAL;  Surgeon: Eulas Post, MD;  Location: [facility] OR;  Service: Orthopedics;  Laterality: Left;  left tibial nail and open reduction internal fixation left fibula fracture  . Orif fibula fracture Left 09/09/2012    Procedure: OPEN REDUCTION INTERNAL FIXATION (ORIF) FIBULA FRACTURE;  Surgeon: Eulas Post, MD;  Location: [facility] OR;  Service: Orthopedics;  Laterality: Left;  . Colonoscopy N/A 10/28/2012    Procedure: COLONOSCOPY;  Surgeon: Petra Kuba, MD;  Location[facility] ENDOSCOPY;  Service: Endoscopy;  Laterality: N/A;  . Esophagogastroduodenoscopy N/A 11/02/2012   Procedure: ESOPHAGOGASTRODUODENOSCOPY (EGD);  Surgeon: Florencia Reasons, MD;  Location: [facility] ENDOSCOPY;  Service: Endoscopy;  Laterality: N/A;  . Colonoscopy N/A 11/02/2012    Procedure: COLONOSCOPY;  Surgeon: Florencia Reasons, MD;  Location: [facility] ENDOSCOPY;  Service: Endoscopy;  Laterality: N/A;  . Colonoscopy N/A 11/03/2012    Procedure: COLONOSCOPY;  Surgeon: Florencia Reasons, MD;  Location: [facility] ENDOSCOPY;  Service: Endoscopy;  Laterality: N/A;  . Givens capsule study N/A 11/04/2012    Procedure: GIVENS CAPSULE STUDY;  Surgeon: Florencia Reasons, MD;  Location: [facility] ENDOSCOPY;  Service: Endoscopy;  Laterality: N/A;  . Enteroscopy N/A 11/08/2012    Procedure: ENTEROSCOPY;  Surgeon: Graylin Shiver, MD;  Location: [facility] ENDOSCOPY;  Service: Endoscopy;  Laterality: N/A;    Family History  Problem Relation Age of Onset  . Coronary artery disease Other   . Diabetes Other     Social History:  reports that he has never smoked. He does not have any smokeless tobacco history on file. He reports that he does not drink alcohol or use illicit drugs.  Allergies: No Known Allergies  Medications: I have reviewed the patient's current medications.  Results for orders placed during the hospital encounter of 05/08/13 (from the past 48 hour(s))  CBC WITH DIFFERENTIAL     Status: Abnormal   Collection Time    05/08/13  6:06 PM      Result Value Range   WBC 7.3  4.0 - 10.5 K/uL   RBC 5.43  4.22 - 5.81 MIL/uL   Hemoglobin 12.5 (*)  13.0 - 17.0 g/dL   HCT 96.0  45.4 - 09.8 %   MCV 72.7 (*) 78.0 - 100.0 fL   MCH 23.0 (*) 26.0 - 34.0 pg   MCHC 31.6  30.0 - 36.0 g/dL   RDW 11.9 (*) 14.7 - 82.9 %   Platelets 398  150 - 400 K/uL   Neutrophils Relative % 69  43 - 77 %   Lymphocytes Relative 18  12 - 46 %   Monocytes Relative 12  3 - 12 %   Eosinophils Relative 1  0 - 5 %   Basophils Relative 0  0 - 1 %   Neutro Abs 5.0  1.7 - 7.7 K/uL   Lymphs Abs 1.3  0.7 - 4.0 K/uL   Monocytes Absolute 0.9  0.1 - 1.0 K/uL    Eosinophils Absolute 0.1  0.0 - 0.7 K/uL   Basophils Absolute 0.0  0.0 - 0.1 K/uL   RBC Morphology ELLIPTOCYTES    COMPREHENSIVE METABOLIC PANEL     Status: Abnormal   Collection Time    05/08/13  6:06 PM      Result Value Range   Sodium 132 (*) 135 - 145 mEq/L   Potassium 4.8  3.5 - 5.1 mEq/L   Chloride 89 (*) 96 - 112 mEq/L   CO2 28  19 - 32 mEq/L   Glucose, Bld 139 (*) 70 - 99 mg/dL   BUN 36 (*) 6 - 23 mg/dL   Creatinine, Ser 5.62 (*) 0.50 - 1.35 mg/dL   Calcium 13.0  8.4 - 86.5 mg/dL   Total Protein 9.4 (*) 6.0 - 8.3 g/dL   Albumin 3.3 (*) 3.5 - 5.2 g/dL   AST 20  0 - 37 U/L   ALT 19  0 - 53 U/L   Alkaline Phosphatase 65  39 - 117 U/L   Total Bilirubin 0.4  0.3 - 1.2 mg/dL   GFR calc non Af Amer 5 (*) >90 mL/min   GFR calc Af Amer 6 (*) >90 mL/min   Comment: (NOTE)     The eGFR has been calculated using the CKD EPI equation.     This calculation has not been validated in all clinical situations.     eGFR's persistently <90 mL/min signify possible Chronic Kidney     Disease.  WOUND CULTURE     Status: None   Collection Time    05/08/13 10:32 PM      Result Value Range   Specimen Description WOUND     Special Requests LEFT GREAT TOE     Gram Stain       Value: NO WBC SEEN     NO SQUAMOUS EPITHELIAL CELLS SEEN     RARE GRAM POSITIVE COCCI IN PAIRS     RARE YEAST     Performed at [facility]   Culture       Value: NO GROWTH     Performed at [facility]   Report Status PENDING    GLUCOSE, CAPILLARY     Status: Abnormal   Collection Time    05/09/13 12:29 AM      Result Value Range   Glucose-Capillary 124 (*) 70 - 99 mg/dL  BASIC METABOLIC PANEL     Status: Abnormal   Collection Time    05/09/13  3:45 AM      Result Value Range   Sodium 131 (*) 135 - 145 mEq/L   Potassium 4.0  3.5 - 5.1 mEq/L  Chloride 90 (*) 96 - 112 mEq/L   CO2 25  19 - 32 mEq/L   Glucose, Bld 155 (*) 70 - 99 mg/dL   BUN 39 (*) 6 - 23 mg/dL   Creatinine, Ser 1.61 (*) 0.50  - 1.35 mg/dL   Calcium 9.6  8.4 - 09.6 mg/dL   GFR calc non Af Amer 5 (*) >90 mL/min   GFR calc Af Amer 6 (*) >90 mL/min   Comment: (NOTE)     The eGFR has been calculated using the CKD EPI equation.     This calculation has not been validated in all clinical situations.     eGFR's persistently <90 mL/min signify possible Chronic Kidney     Disease.  CBC     Status: Abnormal   Collection Time    05/09/13  3:45 AM      Result Value Range   WBC 7.8  4.0 - 10.5 K/uL   RBC 5.07  4.22 - 5.81 MIL/uL   Hemoglobin 11.8 (*) 13.0 - 17.0 g/dL   HCT 04.5 (*) 40.9 - 81.1 %   MCV 72.4 (*) 78.0 - 100.0 fL   MCH 23.3 (*) 26.0 - 34.0 pg   MCHC 32.2  30.0 - 36.0 g/dL   RDW 91.4 (*) 78.2 - 95.6 %   Platelets 374  150 - 400 K/uL  GLUCOSE, CAPILLARY     Status: Abnormal   Collection Time    05/09/13  7:53 AM      Result Value Range   Glucose-Capillary 137 (*) 70 - 99 mg/dL   Comment 1 Documented in Chart     Comment 2 Notify RN    SEDIMENTATION RATE     Status: Abnormal   Collection Time    05/09/13  8:40 AM      Result Value Range   Sed Rate 27 (*) 0 - 16 mm/hr  C-REACTIVE PROTEIN     Status: Abnormal   Collection Time    05/09/13  8:40 AM      Result Value Range   CRP 6.7 (*) <0.60 mg/dL   Comment: Performed at [facility]  GLUCOSE, CAPILLARY     Status: Abnormal   Collection Time    05/09/13 12:12 PM      Result Value Range   Glucose-Capillary 160 (*) 70 - 99 mg/dL   Comment 1 Documented in Chart     Comment 2 Notify RN    GLUCOSE, CAPILLARY     Status: Abnormal   Collection Time    05/09/13  9:35 PM      Result Value Range   Glucose-Capillary 241 (*) 70 - 99 mg/dL    Dg Foot Complete Left  05/08/2013   CLINICAL DATA:  Infected left great toe  EXAM: LEFT FOOT - COMPLETE 3+ VIEW  COMPARISON:  None.  FINDINGS: No fracture or dislocation is seen.  Deformity at the 1st IP joint, favored to be chronic. No definite cortical regularity to suggest acute osteomyelitis.  Mild  soft tissue swelling along the 1st digit.  Postsurgical changes involvement the distal tibia/fibula. The healing tibial fracture deformity. Fracture lucency remains visible.  IMPRESSION: No radiographic findings specific for acute osteomyelitis.  Deformity at the 1st IP joint, favored to be chronic.  Healing fracture deformity involving the distal tibia/fibula, status post ORIF.   Electronically Signed   By: Charline Bills M.D.   On: 05/08/2013 17:52    Review of Systems  All other systems reviewed and  are negative.   Blood pressure 165/87, pulse 84, temperature 99.1 F (37.3 C), temperature source Oral, resp. rate 20, height 5\' 10"  (1.778 m), weight 90 kg (198 lb 6.6 oz), SpO2 99.00%. Physical Exam On examination patient does not have palpable pulses. His foot is warm. There is good turgor to the skin. He has black gangrene of the great toe the nail has been avulsed this is removed. There is drainage from the base of the nailbed. Review of radiographs shows calcification of his digital vessels all the way out to the toes. Assessment/Plan: Assessment diabetic insensate neuropathy with dialysis with severe peripheral vascular disease and gangrene of the left great toe.  Plan: Discussed patient that he has about a 50-50 chance of healing and amputation of the first ray. Discussed that he most likely will require a transtibial amputation. Patient wished to proceed with limb salvage surgery and we will attempt a first ray amputation Friday continue IV antibiotics dialysis on Thursday surgery on Friday.  Oscar Castillo,Oscar Castillo 05/10/2013, 7:17 AM

## 2013-05-10 NOTE — Progress Notes (Signed)
[facility]                                                                                Patient Demographics  Oscar Castillo, is a 62 y.o. male, DOB - March 12, 1951, ION:[id number]  Admit date - 05/08/2013   Admitting Physician Eduard Clos, MD  Outpatient Primary MD for the patient is Oliver Barre, MD  LOS - 2   Chief Complaint  Patient presents with  . toe/foot infection       Subjective:   Seen while he was eating his breakfast, denies any pain or fever.    Assessment & Plan    Left great toe gangrene: -In settings of diabetic peripheral neuropathy and peripheral vascular disease. -Orthopedics evaluated the patient, patient's for first ray amputation on Friday. -Continue antibiotics for now.  ESRD on hemodialysis - Tuesday Thursday and Saturday. Consulted nephrology for dialysis.  Hypertension - continue home medications and patient also has been placed on clear IV hydralazine for systolic blood pressure more than 160.  Diabetes mellitus type 2 with peripheral neuropathy -Check hemoglobin A1c, on carbohydrate modified diet. -Insulin sliding scale.  Peripheral vascular disease -X-rays shows calcification of the distal vessels.  CBG (last 3)   Recent Labs  05/09/13 1212 05/09/13 2135 05/10/13 0756  GLUCAP 160* 241* 140*   Lab Results  Component Value Date   HGBA1C 7.8* 09/11/2012   Recent admission in May for GI bleed - has had extensive workup done at that time including EGD colonoscopy and capsule endoscopy. Closely follow CBC.  Chronic combined systolic and diastolic CHF EF last was 45% - presently compensated. Continue beta blocker.   Code Status: Full  Family Communication:    Disposition Plan:  TBD   Procedures  MRI L Foot   Consults  Ortho, Renal   DVT Prophylaxis  Heparin   Lab Results  Component Value Date   PLT 374 05/09/2013    Medications  Scheduled Meds: . amLODipine  10 mg Oral Daily  . brimonidine  1 drop Both  Eyes BID  . brinzolamide  1 drop Both Eyes BID  . calcium acetate  1,334 mg Oral TID WC  . chlorhexidine  60 mL Topical Once  . docusate sodium  100 mg Oral Daily  . doxercalciferol  2 mcg Intravenous Q T,Th,Sa-HD  . heparin subcutaneous  5,000 Units Subcutaneous Q8H  . insulin aspart  0-9 Units Subcutaneous TID WC  . insulin glargine  5 Units Subcutaneous QHS  . labetalol  200 mg Oral BID  . latanoprost  1 drop Both Eyes QHS  . multivitamin  1 tablet Oral QHS  . piperacillin-tazobactam (ZOSYN)  IV  2.25 g Intravenous Q8H  . sodium chloride  3 mL Intravenous Q12H  . vancomycin  1,000 mg Intravenous Q T,Th,Sa-HD   Continuous Infusions:  PRN Meds:.acetaminophen, acetaminophen, cloNIDine, hydrALAZINE, ondansetron (ZOFRAN) IV, ondansetron, pantoprazole, traMADol, traZODone  Antibiotics     Anti-infectives   Start     Dose/Rate Route Frequency Ordered Stop   05/09/13 1200  vancomycin (VANCOCIN) IVPB 1000 mg/200 mL premix     1,000 mg 200 mL/hr over 60 Minutes Intravenous Every T-Th-Sa (Hemodialysis) 05/08/13 2156  05/08/13 2200  piperacillin-tazobactam (ZOSYN) IVPB 2.25 g     2.25 g 100 mL/hr over 30 Minutes Intravenous 3 times per day 05/08/13 2156     05/08/13 2200  vancomycin (VANCOCIN) 2,000 mg in sodium chloride 0.9 % 500 mL IVPB     2,000 mg 250 mL/hr over 120 Minutes Intravenous  Once 05/08/13 2156 05/09/13 0102       Time Spent in minutes   35   Bess Saltzman A M.D on 05/10/2013 at 10:18 AM  Between 7am to 7pm - Pager - [phone]  After 7pm go to [email or web address] - password [email or web address]  And look for the night coverage person covering for me after hours  [facility] Office  [phone]    Objective:   Filed Vitals:   05/09/13 2143 05/10/13 0452 05/10/13 0555 05/10/13 0637  BP: 181/82 172/85 179/83 165/87  Pulse: 93 84    Temp: 98.1 F (36.7 C) 99.1 F (37.3 C)    TempSrc: Oral Oral    Resp: 18 20    Height:      Weight:  90 kg (198 lb 6.6 oz)     SpO2: 100% 99%      Wt Readings from Last 3 Encounters:  05/10/13 90 kg (198 lb 6.6 oz)  05/10/13 90 kg (198 lb 6.6 oz)  02/20/13 90.719 kg (200 lb)     Intake/Output Summary (Last 24 hours) at 05/10/13 1018 Last data filed at 05/09/13 2300  Gross per 24 hour  Intake    476 ml  Output   1130 ml  Net   -654 ml    Exam Awake Alert, Oriented X 3, No new F.N deficits, Normal affect [place].AT,PERRAL Supple Neck,No JVD, No cervical lymphadenopathy appriciated.  Symmetrical Chest wall movement, Good air movement bilaterally, CTAB RRR,No Gallops,Rubs or new Murmurs, No Parasternal Heave +ve B.Sounds, Abd Soft, Non tender, No organomegaly appriciated, No rebound - guarding or rigidity. No Cyanosis, Clubbing or edema, No new Rash or bruise,  left first toe is cyanotic, there is old blood and hematoma under the left first toe toenail, there is loss of sensation in all toes on the left foot, sensation is poor but better than the right foot,   Data Review   Micro Results Recent Results (from the past 240 hour(s))  WOUND CULTURE     Status: None   Collection Time    05/08/13 10:32 PM      Result Value Range Status   Specimen Description WOUND   Final   Special Requests LEFT GREAT TOE   Final   Gram Stain     Final   Value: NO WBC SEEN     NO SQUAMOUS EPITHELIAL CELLS SEEN     RARE GRAM POSITIVE COCCI IN PAIRS     RARE YEAST     Performed at [facility]   Culture     Final   Value: Culture reincubated for better growth     Performed at [facility]   Report Status PENDING   Incomplete    Radiology Reports Dg Foot Complete Left  05/08/2013   CLINICAL DATA:  Infected left great toe  EXAM: LEFT FOOT - COMPLETE 3+ VIEW  COMPARISON:  None.  FINDINGS: No fracture or dislocation is seen.  Deformity at the 1st IP joint, favored to be chronic. No definite cortical regularity to suggest acute osteomyelitis.  Mild soft tissue swelling along the 1st digit.  Postsurgical  changes involvement the distal tibia/fibula. The  healing tibial fracture deformity. Fracture lucency remains visible.  IMPRESSION: No radiographic findings specific for acute osteomyelitis.  Deformity at the 1st IP joint, favored to be chronic.  Healing fracture deformity involving the distal tibia/fibula, status post ORIF.   Electronically Signed   By: Charline Bills M.D.   On: 05/08/2013 17:52    CBC  Recent Labs Lab 05/08/13 1806 05/09/13 0345  WBC 7.3 7.8  HGB 12.5* 11.8*  HCT 39.5 36.7*  PLT 398 374  MCV 72.7* 72.4*  MCH 23.0* 23.3*  MCHC 31.6 32.2  RDW 25.7* 25.7*  LYMPHSABS 1.3  --   MONOABS 0.9  --   EOSABS 0.1  --   BASOSABS 0.0  --     Chemistries   Recent Labs Lab 05/08/13 1806 05/09/13 0345  NA 132* 131*  K 4.8 4.0  CL 89* 90*  CO2 28 25  GLUCOSE 139* 155*  BUN 36* 39*  CREATININE 9.27* 9.68*  CALCIUM 10.2 9.6  AST 20  --   ALT 19  --   ALKPHOS 65  --   BILITOT 0.4  --    ------------------------------------------------------------------------------------------------------------------ estimated creatinine clearance is 9 ml/min (by C-G formula based on Cr of 9.68). ------------------------------------------------------------------------------------------------------------------ No results found for this basename: HGBA1C,  in the last 72 hours ------------------------------------------------------------------------------------------------------------------ No results found for this basename: CHOL, HDL, LDLCALC, TRIG, CHOLHDL, LDLDIRECT,  in the last 72 hours ------------------------------------------------------------------------------------------------------------------ No results found for this basename: TSH, T4TOTAL, FREET3, T3FREE, THYROIDAB,  in the last 72 hours ------------------------------------------------------------------------------------------------------------------ No results found for this basename: VITAMINB12, FOLATE, FERRITIN, TIBC,  IRON, RETICCTPCT,  in the last 72 hours  Coagulation profile No results found for this basename: INR, PROTIME,  in the last 168 hours  No results found for this basename: DDIMER,  in the last 72 hours  Cardiac Enzymes No results found for this basename: CK, CKMB, TROPONINI, MYOGLOBIN,  in the last 168 hours ------------------------------------------------------------------------------------------------------------------ No components found with this basename: POCBNP,

## 2013-05-10 NOTE — Progress Notes (Signed)
[facility] Progress Note  Subjective:  Depressed this a.m secondary to worrisome prognosis for left foot but does not elaborate. Denies pain. Did not sleep well.   Objective Filed Vitals:   05/09/13 2143 05/10/13 0452 05/10/13 0555 05/10/13 0637  BP: 181/82 172/85 179/83 165/87  Pulse: 93 84    Temp: 98.1 F (36.7 C) 99.1 F (37.3 C)    TempSrc: Oral Oral    Resp: 18 20    Height:      Weight:  90 kg (198 lb 6.6 oz)    SpO2: 100% 99%     Physical Exam General: Elderly, flat affect, NAD Heart: RRR, no murmur or rub appreciated Lungs: CTA bilat, no wheezes, rales or rhonchi Abdomen: Soft, NT, non-distended, + BS Extremities: No LE edema. L foot warm, L great toe with nailbed removed, dried blood Dialysis Access: LUA AVF + bruit  Dialysis Orders: Center: [place] on TTS .  EDW 90 kg HD Bath 2K/2Ca Time 4:15 Heparin 5000 bolus/ 2400 mid. Access L AVF BFR 450 DFR A1.5 (no NA modeling)  Hectorol 2 mcg IV/HD Epogen 0 Units IV/HD Venofer 0  Recent Labs: Hgb 12.3, P 5.0. Last Tsat 21%,    Assessment/Plan: 1. Gangrenous L Great toe/ Severe PVD - ABIs concerning for calcification. Per Ortho, likely needs transtibial amputation due to probability of poor wound healing with toe amputation only. Pt prefers to try limb-sparing surgery first. Plan is for 1st ray amputation on Friday with Dr. Lajoyce Corners.  Wound cultures pending. On vanc/zosyn per pharmacy. 2. ESRD - Cont TTS, Next HD 10/16 3. Hypertension/volume - SBPs 160s on amlodipine and labetalol. No overt volume excess. Net UD 1.1L yesterday. Currently at EDW. 4. Anemia - Hgb 11.8 < 12.3 as op. Epo recently held. No IV Fe. Follow CBC. Resume ESA as needed. 5. Metabolic bone disease - Ca 9.6 ( 10.2 Corrected). Last P 5.0. Last PTH 180 in August. Continue binders, Vit D and low Ca bath  6. Nutrition - Alb 3.3. Renal diet,  multivitamin 7. DM - on Lantus + SS coverage per primary 8. Hx GI Bleed - Per primary, recent admission in May  + extensive work-up. Follow CBC  Scot Jun. Thad Ranger [facility] Pager [phone] 05/10/2013,8:31 AM  LOS: 2 days  I have seen and examined this patient and agree with plan per Claud Kelp.  BP high and labetalol at a very low dose with HR in the 80's, will increase labetalol.  Use clonidine PRN. Follow Ca closely as it is upper end of normal.  Plan HD in AM. Lasaro Primm T,MD 05/10/2013 9:09 AM  Additional Objective Labs: Basic Metabolic Panel:  Recent Labs Lab 05/08/13 1806 05/09/13 0345  NA 132* 131*  K 4.8 4.0  CL 89* 90*  CO2 28 25  GLUCOSE 139* 155*  BUN 36* 39*  CREATININE 9.27* 9.68*  CALCIUM 10.2 9.6   Liver Function Tests:  Recent Labs Lab 05/08/13 1806  AST 20  ALT 19  ALKPHOS 65  BILITOT 0.4  PROT 9.4*  ALBUMIN 3.3*   CBC:  Recent Labs Lab 05/08/13 1806 05/09/13 0345  WBC 7.3 7.8  NEUTROABS 5.0  --   HGB 12.5* 11.8*  HCT 39.5 36.7*  MCV 72.7* 72.4*  PLT 398 374   Blood Culture    Component Value Date/Time   SDES WOUND 05/08/2013 2232   SPECREQUEST LEFT GREAT TOE 05/08/2013 2232   CULT  Value: Culture reincubated for better growth Performed at [facility] 05/08/2013 2232   REPTSTATUS PENDING 05/08/2013 2232    CBG:  Recent Labs Lab 05/09/13 0029 05/09/13 0753 05/09/13 1212 05/09/13 2135 05/10/13 0756  GLUCAP 124* 137* 160* 241* 140*   Studies/Results: Dg Foot Complete Left  05/08/2013   CLINICAL DATA:  Infected left great toe  EXAM: LEFT FOOT - COMPLETE 3+ VIEW  COMPARISON:  None.  FINDINGS: No fracture or dislocation is seen.  Deformity at the 1st IP joint, favored to be chronic. No definite cortical regularity to suggest acute osteomyelitis.  Mild soft tissue swelling along the 1st digit.  Postsurgical changes involvement the distal tibia/fibula. The healing tibial fracture deformity. Fracture lucency remains visible.  IMPRESSION: No radiographic findings specific for acute osteomyelitis.  Deformity  at the 1st IP joint, favored to be chronic.  Healing fracture deformity involving the distal tibia/fibula, status post ORIF.   Electronically Signed   By: Charline Bills M.D.   On: 05/08/2013 17:52   Medications:   . amLODipine  10 mg Oral Daily  . brimonidine  1 drop Both Eyes BID  . brinzolamide  1 drop Both Eyes BID  . calcium acetate  1,334 mg Oral TID WC  . docusate sodium  100 mg Oral Daily  . doxercalciferol  2 mcg Intravenous Q T,Th,Sa-HD  . heparin subcutaneous  5,000 Units Subcutaneous Q8H  . insulin aspart  0-9 Units Subcutaneous TID WC  . insulin glargine  5 Units Subcutaneous QHS  . labetalol  50 mg Oral BID  . latanoprost  1 drop Both Eyes QHS  . multivitamin  1 tablet Oral QHS  . piperacillin-tazobactam (ZOSYN)  IV  2.25 g Intravenous Q8H  . sodium chloride  3 mL Intravenous Q12H  . vancomycin  1,000 mg Intravenous Q T,Th,Sa-HD

## 2013-05-11 DIAGNOSIS — I517 Cardiomegaly: Secondary | ICD-10-CM

## 2013-05-11 LAB — WOUND CULTURE: Gram Stain: NONE SEEN

## 2013-05-11 LAB — GLUCOSE, CAPILLARY
Glucose-Capillary: 127 mg/dL — ABNORMAL HIGH (ref 70–99)
Glucose-Capillary: 175 mg/dL — ABNORMAL HIGH (ref 70–99)
Glucose-Capillary: 140 mg/dL — ABNORMAL HIGH (ref 70–99)

## 2013-05-11 LAB — CBC
HCT: 33.6 % — ABNORMAL LOW (ref 39.0–52.0)
Hemoglobin: 10.9 g/dL — ABNORMAL LOW (ref 13.0–17.0)
MCH: 23.2 pg — ABNORMAL LOW (ref 26.0–34.0)
MCHC: 32.4 g/dL (ref 30.0–36.0)
MCV: 71.6 fL — ABNORMAL LOW (ref 78.0–100.0)
Platelets: 388 10*3/uL (ref 150–400)
WBC: 14 10*3/uL — ABNORMAL HIGH (ref 4.0–10.5)

## 2013-05-11 LAB — RENAL FUNCTION PANEL
BUN: 32 mg/dL — ABNORMAL HIGH (ref 6–23)
Calcium: 9.6 mg/dL (ref 8.4–10.5)
Chloride: 91 mEq/L — ABNORMAL LOW (ref 96–112)
Creatinine, Ser: 8.63 mg/dL — ABNORMAL HIGH (ref 0.50–1.35)
GFR calc non Af Amer: 6 mL/min — ABNORMAL LOW (ref >90)
Glucose, Bld: 167 mg/dL — ABNORMAL HIGH (ref 70–99)
Phosphorus: 3.6 mg/dL (ref 2.3–4.6)
Sodium: 132 mEq/L — ABNORMAL LOW (ref 135–145)

## 2013-05-11 LAB — MRSA PCR SCREENING: MRSA by PCR: NEGATIVE

## 2013-05-11 MED ORDER — CALCIUM ACETATE 667 MG PO CAPS
667.0000 mg | ORAL_CAPSULE | Freq: Three times a day (TID) | ORAL | Status: DC
  Administered 2013-05-11 – 2013-05-15 (×7): 667 mg via ORAL
  Filled 2013-05-11 (×16): qty 1

## 2013-05-11 MED ORDER — LABETALOL HCL 300 MG PO TABS
300.0000 mg | ORAL_TABLET | Freq: Two times a day (BID) | ORAL | Status: DC
  Administered 2013-05-11 – 2013-05-13 (×6): 300 mg via ORAL
  Filled 2013-05-11 (×8): qty 1

## 2013-05-11 MED ORDER — DOXERCALCIFEROL 4 MCG/2ML IV SOLN
INTRAVENOUS | Status: AC
  Administered 2013-05-11: 2 ug via INTRAVENOUS
  Filled 2013-05-11: qty 2

## 2013-05-11 MED ORDER — CHLORHEXIDINE GLUCONATE 4 % EX LIQD
Freq: Once | CUTANEOUS | Status: AC
  Administered 2013-05-12: 13:00:00 via TOPICAL
  Filled 2013-05-11: qty 15

## 2013-05-11 MED ORDER — HEPARIN SODIUM (PORCINE) 1000 UNIT/ML IJ SOLN
5000.0000 [IU] | Freq: Once | INTRAMUSCULAR | Status: AC
  Administered 2013-05-11: 5000 [IU] via INTRAVENOUS

## 2013-05-11 NOTE — Procedures (Signed)
Pt seen on HD.  Note plans for ray amputation in AM.  PO4 3.6 with sl high corrected Ca, will decrease phoslo to 1 AC.  Increase labetalol to 300mg  BID AP 210  Vp 230.  BFR 450.  Will try to pull 2 liters

## 2013-05-11 NOTE — Progress Notes (Signed)
Called into pt's room by wife d/t pt vomiting. Pt vomiting when I entered room. Pt not complaining of any nausea now or throughout day. Vomit was brownish red and contained food. Zofran was given. Pt currently not vomiting. On call NP notified. Will continue to monitor.

## 2013-05-11 NOTE — Progress Notes (Signed)
Patient ID: Oscar Castillo, male   DOB: 03/08/1951, 62 y.o.   MRN: [id number] Plan for left foot first ray amputation tomorrow. Dialysis today. Discussed with the patient the increased risk  of the incision not healing and need for higher level amputation. Discussed the importance of strict nonweightbearing postoperative to provide the best chance for wound healing. 

## 2013-05-11 NOTE — Progress Notes (Signed)
ANTIBIOTIC CONSULT NOTE - Follow-up  Pharmacy Consult for Vancomycin and Zosyn Indication: Gangrenous toe wound  No Known Allergies  Patient Measurements: Height: 5\' 10"  (177.8 cm) Weight: 194 lb 14.2 oz (88.4 kg) IBW/kg (Calculated) : 73  Ht: 70 in  Wt: ~90 kg  Vital Signs: Temp: 98.8 F (37.1 C) (10/16 1122) Temp src: Oral (10/16 1122) BP: 149/83 mmHg (10/16 1122) Pulse Rate: 88 (10/16 1122) Intake/Output from previous day: 10/15 0701 - 10/16 0700 In: 648 [P.O.:598; IV Piggyback:50] Out: -  Intake/Output from this shift: Total I/O In: -  Out: 1955 [Other:1955]  Labs:  Recent Labs  05/08/13 1806 05/09/13 0345 05/11/13 0703  WBC 7.3 7.8 14.0*  HGB 12.5* 11.8* 10.9*  PLT 398 374 388  CREATININE 9.27* 9.68* 8.63*   Estimated Creatinine Clearance: 10.1 ml/min (by C-G formula based on Cr of 8.63). No results found for this basename: VANCOTROUGH, Leodis Binet, VANCORANDOM, GENTTROUGH, GENTPEAK, GENTRANDOM, TOBRATROUGH, TOBRAPEAK, TOBRARND, AMIKACINPEAK, AMIKACINTROU, AMIKACIN,  in the last 72 hours   Microbiology: Recent Results (from the past 720 hour(s))  WOUND CULTURE     Status: None   Collection Time    05/08/13 10:32 PM      Result Value Range Status   Specimen Description WOUND   Final   Special Requests LEFT GREAT TOE   Final   Gram Stain     Final   Value: NO WBC SEEN     NO SQUAMOUS EPITHELIAL CELLS SEEN     RARE GRAM POSITIVE COCCI IN PAIRS     RARE YEAST     Performed at [facility]   Culture     Final   Value: MODERATE GROUP B STREP(S.AGALACTIAE)ISOLATED     Note: TESTING AGAINST S. AGALACTIAE NOT ROUTINELY PERFORMED DUE TO PREDICTABILITY OF AMP/PEN/VAN SUSCEPTIBILITY.     Performed at [facility]   Report Status 05/11/2013 FINAL   Final   Assessment: 62 y.o. male presents with gangrenous toe wound continues on vancomycin + zosyn. Tmax is 100.1 and WBC is 14. Pt received vanc dose today with HD. Planning amputation tomorrow.    Goal of Therapy:  Pre-HD vanc level 15-25 mcg/ml  Plan:  1. Continue vanc 1gm post-HD 2. Continue zosyn 2.25gm IV Q8H 3. F/u renal plans, C&S, clinical status and pre-HD level at [facility], PharmD, BCPS Pager # [phone] 05/11/2013 11:54 AM

## 2013-05-11 NOTE — Progress Notes (Signed)
[facility]                                                                                Patient Demographics Oscar Castillo, is a 62 y.o. male, DOB - [birth year], ZOX:[id number] Admit date - 05/08/2013   Admitting Physician Eduard Clos, MD Outpatient Primary MD for the patient is Oliver Barre, MD  LOS - 3   Chief Complaint  Patient presents with  . toe/foot infection       Subjective:   Denies any specific complaints, no evidence on it, seen after dialysis per    Assessment & Plan    Left great toe gangrene: -In settings of diabetic peripheral neuropathy and peripheral vascular disease. -Orthopedics evaluated the patient, patient's for first ray amputation on Friday. -Continue antibiotics for now.  ESRD on hemodialysis - Tuesday Thursday and Saturday. Consulted nephrology for dialysis.  Hypertension - continue home medications and patient also has been placed on clear IV hydralazine for systolic blood pressure more than 160.  Diabetes mellitus type 2 with peripheral neuropathy -Check hemoglobin A1c, on carbohydrate modified diet. -Insulin sliding scale.  Peripheral vascular disease -X-rays shows calcification of the distal vessels.  CBG (last 3)   Recent Labs  05/10/13 1732 05/10/13 2134 05/11/13 1214  GLUCAP 241* 139* 127*   Lab Results  Component Value Date   HGBA1C 7.8* 09/11/2012   Recent admission in May for GI bleed - has had extensive workup done at that time including EGD colonoscopy and capsule endoscopy. Closely follow CBC.  Chronic combined systolic and diastolic CHF EF last was 45% - presently compensated. Continue beta blocker.   Code Status: Full  Family Communication:    Disposition Plan:  TBD   Procedures  MRI L Foot   Consults  Ortho, Renal   DVT Prophylaxis  Heparin   Lab Results  Component Value Date   PLT 388 05/11/2013    Medications  Scheduled Meds: . amLODipine  10 mg Oral Daily  . brimonidine  1 drop  Both Eyes BID  . brinzolamide  1 drop Both Eyes BID  . calcium acetate  667 mg Oral TID WC  . chlorhexidine  60 mL Topical Once  . [START ON 05/12/2013] chlorhexidine   Topical Once  . docusate sodium  100 mg Oral Daily  . doxercalciferol  2 mcg Intravenous Q T,Th,Sa-HD  . heparin subcutaneous  5,000 Units Subcutaneous Q8H  . insulin aspart  0-9 Units Subcutaneous TID WC  . insulin glargine  5 Units Subcutaneous QHS  . labetalol  300 mg Oral BID  . latanoprost  1 drop Both Eyes QHS  . multivitamin  1 tablet Oral QHS  . piperacillin-tazobactam (ZOSYN)  IV  2.25 g Intravenous Q8H  . sodium chloride  3 mL Intravenous Q12H  . vancomycin  1,000 mg Intravenous Q T,Th,Sa-HD   Continuous Infusions:  PRN Meds:.acetaminophen, acetaminophen, cloNIDine, hydrALAZINE, ondansetron (ZOFRAN) IV, ondansetron, pantoprazole, traMADol, traZODone  Antibiotics     Anti-infectives   Start     Dose/Rate Route Frequency Ordered Stop   05/09/13 1200  vancomycin (VANCOCIN) IVPB 1000 mg/200 mL premix     1,000 mg 200 mL/hr over 60 Minutes  Intravenous Every T-Th-Sa (Hemodialysis) 05/08/13 2156     05/08/13 2200  piperacillin-tazobactam (ZOSYN) IVPB 2.25 g     2.25 g 100 mL/hr over 30 Minutes Intravenous 3 times per day 05/08/13 2156     05/08/13 2200  vancomycin (VANCOCIN) 2,000 mg in sodium chloride 0.9 % 500 mL IVPB     2,000 mg 250 mL/hr over 120 Minutes Intravenous  Once 05/08/13 2156 05/09/13 0102       Time Spent in minutes   35   Arth Nicastro A M.D on 05/11/2013 at 4:37 PM  Between 7am to 7pm - Pager - [phone]  After 7pm go to [email or web address] - password [email or web address]  And look for the night coverage person covering for me after hours  [facility] Office  [phone]    Objective:   Filed Vitals:   05/11/13 1122 05/11/13 1209 05/11/13 1312 05/11/13 1356  BP: 149/83 134/67 144/78 145/86  Pulse: 88 88 87 87  Temp: 98.8 F (37.1 C) 98.7 F (37.1 C)  99 F (37.2 C)  TempSrc:  Oral Oral  Oral  Resp: 18 16  18   Height:      Weight: 88.4 kg (194 lb 14.2 oz)     SpO2: 97% 96%  98%    Wt Readings from Last 3 Encounters:  05/11/13 88.4 kg (194 lb 14.2 oz)  05/11/13 88.4 kg (194 lb 14.2 oz)  02/20/13 90.719 kg (200 lb)     Intake/Output Summary (Last 24 hours) at 05/11/13 1637 Last data filed at 05/11/13 1506  Gross per 24 hour  Intake   1180 ml  Output   1955 ml  Net   -775 ml    Exam Awake Alert, Oriented X 3, No new F.N deficits, Normal affect Ste[place]uer.AT,PERRAL Supple Neck,No JVD, No cervical lymphadenopathy appriciated.  Symmetrical Chest wall movement, Good air movement bilaterally, CTAB RRR,No Gallops,Rubs or new Murmurs, No Parasternal Heave +ve B.Sounds, Abd Soft, Non tender, No organomegaly appriciated, No rebound - guarding or rigidity. No Cyanosis, Clubbing or edema, No new Rash or bruise,  left first toe is cyanotic, there is old blood and hematoma under the left first toe toenail, there is loss of sensation in all toes on the left foot, sensation is poor but better than the right foot,   Data Review   Micro Results Recent Results (from the past 240 hour(s))  WOUND CULTURE     Status: None   Collection Time    05/08/13 10:32 PM      Result Value Range Status   Specimen Description WOUND   Final   Special Requests LEFT GREAT TOE   Final   Gram Stain     Final   Value: NO WBC SEEN     NO SQUAMOUS EPITHELIAL CELLS SEEN     RARE GRAM POSITIVE COCCI IN PAIRS     RARE YEAST     Performed at [facility]   Culture     Final   Value: MODERATE GROUP B STREP(S.AGALACTIAE)ISOLATED     Note: TESTING AGAINST S. AGALACTIAE NOT ROUTINELY PERFORMED DUE TO PREDICTABILITY OF AMP/PEN/VAN SUSCEPTIBILITY.     Performed at [facility]   Report Status 05/11/2013 FINAL   Final    Radiology Reports Dg Foot Complete Left  05/08/2013   CLINICAL DATA:  Infected left great toe  EXAM: LEFT FOOT - COMPLETE 3+ VIEW  COMPARISON:  None.   FINDINGS: No fracture or dislocation is seen.  Deformity at the 1st IP  joint, favored to be chronic. No definite cortical regularity to suggest acute osteomyelitis.  Mild soft tissue swelling along the 1st digit.  Postsurgical changes involvement the distal tibia/fibula. The healing tibial fracture deformity. Fracture lucency remains visible.  IMPRESSION: No radiographic findings specific for acute osteomyelitis.  Deformity at the 1st IP joint, favored to be chronic.  Healing fracture deformity involving the distal tibia/fibula, status post ORIF.   Electronically Signed   By: Charline Bills M.D.   On: 05/08/2013 17:52    CBC  Recent Labs Lab 05/08/13 1806 05/09/13 0345 05/11/13 0703  WBC 7.3 7.8 14.0*  HGB 12.5* 11.8* 10.9*  HCT 39.5 36.7* 33.6*  PLT 398 374 388  MCV 72.7* 72.4* 71.6*  MCH 23.0* 23.3* 23.2*  MCHC 31.6 32.2 32.4  RDW 25.7* 25.7* 25.3*  LYMPHSABS 1.3  --   --   MONOABS 0.9  --   --   EOSABS 0.1  --   --   BASOSABS 0.0  --   --     Chemistries   Recent Labs Lab 05/08/13 1806 05/09/13 0345 05/11/13 0703  NA 132* 131* 132*  K 4.8 4.0 4.3  CL 89* 90* 91*  CO2 28 25 27   GLUCOSE 139* 155* 167*  BUN 36* 39* 32*  CREATININE 9.27* 9.68* 8.63*  CALCIUM 10.2 9.6 9.6  AST 20  --   --   ALT 19  --   --   ALKPHOS 65  --   --   BILITOT 0.4  --   --    ------------------------------------------------------------------------------------------------------------------ estimated creatinine clearance is 10.1 ml/min (by C-G formula based on Cr of 8.63). ------------------------------------------------------------------------------------------------------------------ No results found for this basename: HGBA1C,  in the last 72 hours ------------------------------------------------------------------------------------------------------------------ No results found for this basename: CHOL, HDL, LDLCALC, TRIG, CHOLHDL, LDLDIRECT,  in the last 72  hours ------------------------------------------------------------------------------------------------------------------ No results found for this basename: TSH, T4TOTAL, FREET3, T3FREE, THYROIDAB,  in the last 72 hours ------------------------------------------------------------------------------------------------------------------ No results found for this basename: VITAMINB12, FOLATE, FERRITIN, TIBC, IRON, RETICCTPCT,  in the last 72 hours  Coagulation profile No results found for this basename: INR, PROTIME,  in the last 168 hours  No results found for this basename: DDIMER,  in the last 72 hours  Cardiac Enzymes No results found for this basename: CK, CKMB, TROPONINI, MYOGLOBIN,  in the last 168 hours ------------------------------------------------------------------------------------------------------------------ No components found with this basename: POCBNP,

## 2013-05-12 ENCOUNTER — Encounter (HOSPITAL_COMMUNITY): Payer: Medicare Other | Admitting: Anesthesiology

## 2013-05-12 ENCOUNTER — Inpatient Hospital Stay (HOSPITAL_COMMUNITY): Payer: Medicare Other | Admitting: Anesthesiology

## 2013-05-12 ENCOUNTER — Encounter (HOSPITAL_COMMUNITY): Payer: Self-pay | Admitting: Anesthesiology

## 2013-05-12 ENCOUNTER — Encounter (HOSPITAL_COMMUNITY)
Admission: EM | Disposition: A | Discharge status: Home / Self Care | Payer: Self-pay | Source: Home / Self Care | Attending: Internal Medicine

## 2013-05-12 HISTORY — PX: AMPUTATION: SHX166

## 2013-05-12 LAB — GLUCOSE, CAPILLARY
Glucose-Capillary: 110 mg/dL — ABNORMAL HIGH (ref 70–99)
Glucose-Capillary: 116 mg/dL — ABNORMAL HIGH (ref 70–99)
Glucose-Capillary: 113 mg/dL — ABNORMAL HIGH (ref 70–99)
Glucose-Capillary: 100 mg/dL — ABNORMAL HIGH (ref 70–99)

## 2013-05-12 LAB — TYPE AND SCREEN
ABO/RH(D): O POS
Antibody Screen: NEGATIVE

## 2013-05-12 SURGERY — AMPUTATION, FOOT, RAY
Anesthesia: Monitor Anesthesia Care | Site: Foot | Laterality: Left | Wound class: Dirty or Infected

## 2013-05-12 MED ORDER — METOCLOPRAMIDE HCL 10 MG PO TABS: 5.0000 mg | ORAL_TABLET | Freq: Three times a day (TID) | ORAL | Status: DC | PRN

## 2013-05-12 MED ORDER — HYDROMORPHONE HCL PF 1 MG/ML IJ SOLN: 0.2500 mg | INTRAMUSCULAR | Status: DC | PRN

## 2013-05-12 MED ORDER — METOCLOPRAMIDE HCL 5 MG/ML IJ SOLN
5.0000 mg | Freq: Three times a day (TID) | INTRAMUSCULAR | Status: DC | PRN
  Filled 2013-05-12: qty 2

## 2013-05-12 MED ORDER — HYDROMORPHONE HCL PF 1 MG/ML IJ SOLN
0.5000 mg | INTRAMUSCULAR | Status: DC | PRN
  Administered 2013-05-13 – 2013-05-14 (×2): 1 mg via INTRAVENOUS
  Filled 2013-05-12 (×2): qty 1

## 2013-05-12 MED ORDER — FENTANYL CITRATE 0.05 MG/ML IJ SOLN
INTRAMUSCULAR | Status: DC | PRN
  Administered 2013-05-12: 50 ug via INTRAVENOUS
  Administered 2013-05-12: 25 ug via INTRAVENOUS

## 2013-05-12 MED ORDER — HYDROCODONE-ACETAMINOPHEN 5-325 MG PO TABS
1.0000 | ORAL_TABLET | ORAL | Status: DC | PRN
  Administered 2013-05-12: 1 via ORAL
  Administered 2013-05-13 – 2013-05-15 (×2): 2 via ORAL
  Filled 2013-05-12 (×2): qty 2
  Filled 2013-05-12: qty 1

## 2013-05-12 MED ORDER — ONDANSETRON HCL 4 MG PO TABS: 4.0000 mg | ORAL_TABLET | Freq: Four times a day (QID) | ORAL | Status: DC | PRN

## 2013-05-12 MED ORDER — ONDANSETRON HCL 4 MG/2ML IJ SOLN: 4.0000 mg | Freq: Once | INTRAMUSCULAR | Status: DC | PRN

## 2013-05-12 MED ORDER — SODIUM CHLORIDE 0.9 % IV SOLN
INTRAVENOUS | Status: DC
  Administered 2013-05-13: 22:00:00 via INTRAVENOUS

## 2013-05-12 MED ORDER — 0.9 % SODIUM CHLORIDE (POUR BTL) OPTIME
TOPICAL | Status: DC | PRN
  Administered 2013-05-12: 1000 mL

## 2013-05-12 MED ORDER — MIDAZOLAM HCL 5 MG/5ML IJ SOLN
INTRAMUSCULAR | Status: DC | PRN
  Administered 2013-05-12 (×2): 1 mg via INTRAVENOUS

## 2013-05-12 MED ORDER — PROPOFOL INFUSION 10 MG/ML OPTIME
INTRAVENOUS | Status: DC | PRN
  Administered 2013-05-12: 75 ug/kg/min via INTRAVENOUS

## 2013-05-12 MED ORDER — ONDANSETRON HCL 4 MG/2ML IJ SOLN
INTRAMUSCULAR | Status: DC | PRN
  Administered 2013-05-12: 4 mg via INTRAMUSCULAR

## 2013-05-12 MED ORDER — SODIUM CHLORIDE 0.9 % IV SOLN
INTRAVENOUS | Status: DC | PRN
  Administered 2013-05-12: 16:00:00 via INTRAVENOUS

## 2013-05-12 MED ORDER — ONDANSETRON HCL 4 MG/2ML IJ SOLN: 4.0000 mg | Freq: Four times a day (QID) | INTRAMUSCULAR | Status: DC | PRN

## 2013-05-12 MED ORDER — OXYCODONE-ACETAMINOPHEN 5-325 MG PO TABS: 1.0000 | ORAL_TABLET | ORAL | Status: DC | PRN

## 2013-05-12 SURGICAL SUPPLY — 48 items
BANDAGE ESMARK 6X9 LF (GAUZE/BANDAGES/DRESSINGS) IMPLANT
BANDAGE GAUZE ELAST BULKY 4 IN (GAUZE/BANDAGES/DRESSINGS) ×1 IMPLANT
BLADE SAW SGTL MED 73X18.5 STR (BLADE) IMPLANT
BNDG CMPR 9X6 STRL LF SNTH (GAUZE/BANDAGES/DRESSINGS)
BNDG COHESIVE 4X5 TAN STRL (GAUZE/BANDAGES/DRESSINGS) ×2 IMPLANT
BNDG COHESIVE 6X5 TAN STRL LF (GAUZE/BANDAGES/DRESSINGS) ×1 IMPLANT
BNDG ESMARK 6X9 LF (GAUZE/BANDAGES/DRESSINGS)
CLOTH BEACON ORANGE TIMEOUT ST (SAFETY) ×1 IMPLANT
CUFF TOURNIQUET SINGLE 34IN LL (TOURNIQUET CUFF) IMPLANT
CUFF TOURNIQUET SINGLE 44IN (TOURNIQUET CUFF) IMPLANT
DRAPE U-SHAPE 47X51 STRL (DRAPES) ×3 IMPLANT
DRSG ADAPTIC 3X8 NADH LF (GAUZE/BANDAGES/DRESSINGS) ×2 IMPLANT
DRSG PAD ABDOMINAL 8X10 ST (GAUZE/BANDAGES/DRESSINGS) ×1 IMPLANT
DURAPREP 26ML APPLICATOR (WOUND CARE) ×2 IMPLANT
ELECT REM PT RETURN 9FT ADLT (ELECTROSURGICAL) ×2
ELECTRODE REM PT RTRN 9FT ADLT (ELECTROSURGICAL) ×1 IMPLANT
GLOVE BIOGEL PI IND STRL 7.5 (GLOVE) IMPLANT
GLOVE BIOGEL PI IND STRL 8 (GLOVE) IMPLANT
GLOVE BIOGEL PI IND STRL 9 (GLOVE) ×1 IMPLANT
GLOVE BIOGEL PI INDICATOR 7.5 (GLOVE) ×2
GLOVE BIOGEL PI INDICATOR 8 (GLOVE) ×2
GLOVE BIOGEL PI INDICATOR 9 (GLOVE) ×1
GLOVE ECLIPSE 7.0 STRL STRAW (GLOVE) ×1 IMPLANT
GLOVE SURG ORTHO 9.0 STRL STRW (GLOVE) ×2 IMPLANT
GLOVE SURG SS PI 8.0 STRL IVOR (GLOVE) ×1 IMPLANT
GOWN DECONTAM XXLG NS DISP (GOWN DISPOSABLE) ×2 IMPLANT
GOWN PREVENTION PLUS XLARGE (GOWN DISPOSABLE) ×1 IMPLANT
GOWN SRG XL XLNG 56XLVL 4 (GOWN DISPOSABLE) ×1 IMPLANT
GOWN STRL NON-REIN XL XLG LVL4 (GOWN DISPOSABLE) ×4
KIT BASIN OR (CUSTOM PROCEDURE TRAY) ×2 IMPLANT
KIT ROOM TURNOVER OR (KITS) ×2 IMPLANT
MANIFOLD NEPTUNE II (INSTRUMENTS) ×2 IMPLANT
NS IRRIG 1000ML POUR BTL (IV SOLUTION) ×2 IMPLANT
PACK ORTHO EXTREMITY (CUSTOM PROCEDURE TRAY) ×2 IMPLANT
PAD ARMBOARD 7.5X6 YLW CONV (MISCELLANEOUS) ×4 IMPLANT
PAD CAST 4YDX4 CTTN HI CHSV (CAST SUPPLIES) ×1 IMPLANT
PADDING CAST COTTON 4X4 STRL (CAST SUPPLIES) ×2
SPONGE GAUZE 4X4 12PLY (GAUZE/BANDAGES/DRESSINGS) ×2 IMPLANT
SPONGE LAP 18X18 X RAY DECT (DISPOSABLE) ×2 IMPLANT
STAPLER VISISTAT 35W (STAPLE) ×1 IMPLANT
STOCKINETTE IMPERVIOUS LG (DRAPES) IMPLANT
SUCTION FRAZIER TIP 10 FR DISP (SUCTIONS) ×1 IMPLANT
SUT ETHILON 2 0 PSLX (SUTURE) ×4 IMPLANT
TOWEL OR 17X24 6PK STRL BLUE (TOWEL DISPOSABLE) ×2 IMPLANT
TOWEL OR 17X26 10 PK STRL BLUE (TOWEL DISPOSABLE) ×2 IMPLANT
TUBE CONNECTING 12X1/4 (SUCTIONS) ×1 IMPLANT
UNDERPAD 30X30 INCONTINENT (UNDERPADS AND DIAPERS) ×1 IMPLANT
WATER STERILE IRR 1000ML POUR (IV SOLUTION) ×1 IMPLANT

## 2013-05-12 NOTE — Progress Notes (Signed)
Orthopedic Tech Progress Note Patient Details:  Oscar Castillo 04-06-1951 [id number]  Ortho Devices Type of Ortho Device: Postop shoe/boot Ortho Device/Splint Location: lle Ortho Device/Splint Interventions: Application   Nikki Dom 05/12/2013, 6:39 PM

## 2013-05-12 NOTE — Anesthesia Postprocedure Evaluation (Signed)
  Anesthesia Post-op Note  Patient: Oscar Castillo  Procedure(s) Performed: Procedure(s) with comments: AMPUTATION RAY (Left) - Left Foot 1st Ray Amputation  Patient Location: PACU  Anesthesia Type:MAC  Level of Consciousness: awake, sedated and patient cooperative  Airway and Oxygen Therapy: Patient Spontanous Breathing  Post-op Pain: mild  Post-op Assessment: Post-op Vital signs reviewed, Patient's Cardiovascular Status Stable, Respiratory Function Stable, Patent Airway, No signs of Nausea or vomiting and Pain level controlled  Post-op Vital Signs: stable  Complications: No apparent anesthesia complications

## 2013-05-12 NOTE — Transfer of Care (Signed)
Immediate Anesthesia Transfer of Care Note  Patient: Oscar Castillo  Procedure(s) Performed: Procedure(s) with comments: AMPUTATION RAY (Left) - Left Foot 1st Ray Amputation  Patient Location: PACU  Anesthesia Type:MAC  Level of Consciousness: awake, alert , oriented and patient cooperative  Airway & Oxygen Therapy: Patient Spontanous Breathing  Post-op Assessment: Report given to PACU RN, Post -op Vital signs reviewed and stable and Patient moving all extremities  Post vital signs: Reviewed and stable  Complications: No apparent anesthesia complications

## 2013-05-12 NOTE — Progress Notes (Signed)
S: no new CO.  Has been eating OK, bowels moving O:BP 168/84  Pulse 80  Temp(Src) 98.9 F (37.2 C) (Oral)  Resp 16  Ht 5\' 10"  (1.778 m)  Wt 90.2 kg (198 lb 13.7 oz)  BMI 28.53 kg/m2  SpO2 98%  Intake/Output Summary (Last 24 hours) at 05/12/13 0830 Last data filed at 05/12/13 0827  Gross per 24 hour  Intake   1332 ml  Output   2206 ml  Net   -874 ml   Weight change: -2 kg (-4 lb 6.5 oz) ZOX:WRUEA and alert CVS:RRR Resp:clear Abd:+ BS NTND Ext: no edema.  + bruit Lt AVF,  Dry gangrene Lt great toe NEURO:CNI Ox3 no asterixis   . amLODipine  10 mg Oral Daily  . brimonidine  1 drop Both Eyes BID  . brinzolamide  1 drop Both Eyes BID  . calcium acetate  667 mg Oral TID WC  . chlorhexidine  60 mL Topical Once  . chlorhexidine   Topical Once  . docusate sodium  100 mg Oral Daily  . doxercalciferol  2 mcg Intravenous Q T,Th,Sa-HD  . heparin subcutaneous  5,000 Units Subcutaneous Q8H  . insulin aspart  0-9 Units Subcutaneous TID WC  . insulin glargine  5 Units Subcutaneous QHS  . labetalol  300 mg Oral BID  . latanoprost  1 drop Both Eyes QHS  . multivitamin  1 tablet Oral QHS  . piperacillin-tazobactam (ZOSYN)  IV  2.25 g Intravenous Q8H  . sodium chloride  3 mL Intravenous Q12H  . vancomycin  1,000 mg Intravenous Q T,Th,Sa-HD   Mr Foot Left Wo Contrast  05/10/2013   CLINICAL DATA:  Gangrene of the left great toe.  EXAM: MRI OF THE LEFT FOREFOOT WITHOUT CONTRAST  TECHNIQUE: Multiplanar, multisequence MR imaging was performed. No intravenous contrast was administered.  COMPARISON:  Radiographs dated 05/08/2013  FINDINGS: There is no evidence of osteomyelitis of the great toe or of other bones of the left forefoot. There is chronic deformity of the distal phalanx of the great toe which probably due to a remote fracture dislocation with fragmentation. There is secondary arthritic changes at the IP joint of the great toe. There is no soft tissue abscess or definable cellulitis.   Slight arthritis at the head of the 2nd metatarsal.  IMPRESSION: 1. No evidence of osteomyelitis of the left great toe or elsewhere in the left forefoot. 2. 2. Chronic probable posttraumatic deformity of the IP joint of the great toe.   Electronically Signed   By: Geanie Cooley M.D.   On: 05/10/2013 14:21   BMET    Component Value Date/Time   NA 132* 05/11/2013 0703   K 4.3 05/11/2013 0703   CL 91* 05/11/2013 0703   CO2 27 05/11/2013 0703   GLUCOSE 167* 05/11/2013 0703   BUN 32* 05/11/2013 0703   CREATININE 8.63* 05/11/2013 0703   CALCIUM 9.6 05/11/2013 0703   GFRNONAA 6* 05/11/2013 0703   GFRAA 7* 05/11/2013 0703   CBC    Component Value Date/Time   WBC 14.0* 05/11/2013 0703   RBC 4.69 05/11/2013 0703   HGB 10.9* 05/11/2013 0703   HCT 33.6* 05/11/2013 0703   PLT 388 05/11/2013 0703   MCV 71.6* 05/11/2013 0703   MCH 23.2* 05/11/2013 0703   MCHC 32.4 05/11/2013 0703   RDW 25.3* 05/11/2013 0703   LYMPHSABS 1.3 05/08/2013 1806   MONOABS 0.9 05/08/2013 1806   EOSABS 0.1 05/08/2013 1806   BASOSABS 0.0 05/08/2013 1806  Assessment: 1. Dry gangrene lt Great toe 2. HTN 3. DM 4. Anemia 5. ESRD Plan: 1. For Ray amputation today 2. Plan HD in am 3. Check labs at HD in AM   Jamisha Hoeschen T

## 2013-05-12 NOTE — H&P (View-Only) (Signed)
Patient ID: Oscar Castillo, male   DOB: 1951/06/07, 63 y.o.   MRN: [id number] Plan for left foot first ray amputation tomorrow. Dialysis today. Discussed with the patient the increased risk  of the incision not healing and need for higher level amputation. Discussed the importance of strict nonweightbearing postoperative to provide the best chance for wound healing.

## 2013-05-12 NOTE — Op Note (Signed)
OPERATIVE REPORT  DATE OF SURGERY: 05/12/2013  PATIENT:  Oscar Castillo,  62 y.o. male  PRE-OPERATIVE DIAGNOSIS:  Gangrene Left Great Toe  POST-OPERATIVE DIAGNOSIS:  Gangrene Left Great Toe  PROCEDURE:  Procedure(s): AMPUTATION RAY left foot great toe  SURGEON:  Surgeon(s): Nadara Mustard, MD  ANESTHESIA:   IV sedation  EBL:  min ML  SPECIMEN:  No Specimen  TOURNIQUET:  * No tourniquets in log *  PROCEDURE DETAILS: Patient is a 62 year old gentleman with dry gangrene of the left great toe. Patient is not felt to be a revascularization candidate and presents at this time for first ray amputation. Risks and benefits were discussed including infection nonhealing of the wound need for higher level amputation. Patient states he understands and wished to proceed at this time. Description of procedure patient brought to the operating room and underwent a MAC anesthetic. After adequate levels of anesthesia were obtained patient's left lower extremity was prepped using DuraPrep and draped into a sterile field. A racquet incision was made around the toe and the first ray. The necrotic toe and first metatarsal were resected in one block of tissue. Hemostasis was obtained. There was good petechial bleeding. The incision was closed using 2-0 nylon. The wound was covered with Adaptic orthopedic sponges AB dressing Kerlix and Coban. Patient was taken to the PACU in stable condition.  PLAN OF CARE: Admit to inpatient   PATIENT DISPOSITION:  PACU - hemodynamically stable.   Nadara Mustard, MD 05/12/2013 4:42 PM

## 2013-05-12 NOTE — Interval H&P Note (Signed)
History and Physical Interval Note:  05/12/2013 5:57 AM  Oscar Castillo  has presented today for surgery, with the diagnosis of Gangrene Left Great Toe  The various methods of treatment have been discussed with the patient and family. After consideration of risks, benefits and other options for treatment, the patient has consented to  Procedure(s) with comments: AMPUTATION RAY (Left) - Left Foot 1st Ray Amputation as a surgical intervention .  The patient's history has been reviewed, patient examined, no change in status, stable for surgery.  I have reviewed the patient's chart and labs.  Questions were answered to the patient's satisfaction.     Sundus Pete V

## 2013-05-12 NOTE — Anesthesia Preprocedure Evaluation (Addendum)
Anesthesia Evaluation  Patient identified by MRN, date of birth, ID band Patient awake    Reviewed: Allergy & Precautions, H&P , NPO status , Patient's Chart, lab work & pertinent test results  Airway       Dental   Pulmonary          Cardiovascular hypertension, + Peripheral Vascular Disease and +CHF     Neuro/Psych PSYCHIATRIC DISORDERS Depression TIA   GI/Hepatic GERD-  ,(+) Hepatitis -  Endo/Other  diabetes, Type 2, Insulin Dependent and Oral Hypoglycemic Agents  Renal/GU ESRF and DialysisRenal disease     Musculoskeletal   Abdominal   Peds  Hematology   Anesthesia Other Findings   Reproductive/Obstetrics                           Anesthesia Physical Anesthesia Plan  ASA: III  Anesthesia Plan: MAC and General   Post-op Pain Management:    Induction: Intravenous  Airway Management Planned: Mask and LMA  Additional Equipment:   Intra-op Plan:   Post-operative Plan: Extubation in OR  Informed Consent: I have reviewed the patients History and Physical, chart, labs and discussed the procedure including the risks, benefits and alternatives for the proposed anesthesia with the patient or authorized representative who has indicated his/her understanding and acceptance.     Plan Discussed with: CRNA, Anesthesiologist and Surgeon  Anesthesia Plan Comments:         Anesthesia Quick Evaluation

## 2013-05-12 NOTE — Preoperative (Signed)
Beta Blockers   Reason not to administer Beta Blockers:Not Applicable 

## 2013-05-12 NOTE — Progress Notes (Signed)
[facility]                                                                                Patient Demographics Oscar Castillo, is a 62 y.o. male, DOB - 1951-02-29, ZOX:[id number] Admit date - 05/08/2013   Admitting Physician Eduard Clos, MD Outpatient Primary MD for the patient is Oliver Barre, MD  LOS - 4   Chief Complaint  Patient presents with  . toe/foot infection       Subjective:   No changes clinically, denies specific complaints, for amputation of the right first ray.  Assessment & Plan    Left great toe gangrene: -In settings of diabetic peripheral neuropathy and peripheral vascular disease. -Orthopedics evaluated the patient, patient's for first ray amputation today. -Continue antibiotics for now.  ESRD on hemodialysis - Tuesday Thursday and Saturday. Consulted nephrology for dialysis.  Hypertension - continue home medications and patient also has been placed on clear IV hydralazine for systolic blood pressure more than 160.  Diabetes mellitus type 2 with peripheral neuropathy -Check hemoglobin A1c, on carbohydrate modified diet. -Insulin sliding scale.  Peripheral vascular disease -X-rays shows calcification of the distal vessels.  CBG (last 3)   Recent Labs  05/11/13 2151 05/12/13 0752 05/12/13 1134  GLUCAP 140* 110* 116*   Lab Results  Component Value Date   HGBA1C 7.8* 09/11/2012   Recent admission in May for GI bleed - has had extensive workup done at that time including EGD colonoscopy and capsule endoscopy. Closely follow CBC.  Chronic combined systolic and diastolic CHF EF last was 45% - presently compensated. Continue beta blocker.   Code Status: Full  Family Communication:    Disposition Plan:  TBD   Procedures  MRI L Foot   Consults  Ortho, Renal   DVT Prophylaxis  Heparin   Lab Results  Component Value Date   PLT 388 05/11/2013    Medications  Scheduled Meds: . amLODipine  10 mg Oral Daily  . brimonidine  1  drop Both Eyes BID  . brinzolamide  1 drop Both Eyes BID  . calcium acetate  667 mg Oral TID WC  . chlorhexidine  60 mL Topical Once  . chlorhexidine   Topical Once  . docusate sodium  100 mg Oral Daily  . doxercalciferol  2 mcg Intravenous Q T,Th,Sa-HD  . heparin subcutaneous  5,000 Units Subcutaneous Q8H  . insulin aspart  0-9 Units Subcutaneous TID WC  . insulin glargine  5 Units Subcutaneous QHS  . labetalol  300 mg Oral BID  . latanoprost  1 drop Both Eyes QHS  . multivitamin  1 tablet Oral QHS  . piperacillin-tazobactam (ZOSYN)  IV  2.25 g Intravenous Q8H  . sodium chloride  3 mL Intravenous Q12H  . vancomycin  1,000 mg Intravenous Q T,Th,Sa-HD   Continuous Infusions:  PRN Meds:.acetaminophen, acetaminophen, cloNIDine, hydrALAZINE, ondansetron (ZOFRAN) IV, ondansetron, pantoprazole, traMADol, traZODone  Antibiotics     Anti-infectives   Start     Dose/Rate Route Frequency Ordered Stop   05/09/13 1200  vancomycin (VANCOCIN) IVPB 1000 mg/200 mL premix     1,000 mg 200 mL/hr over 60 Minutes Intravenous Every T-Th-Sa (Hemodialysis) 05/08/13  2156     05/08/13 2200  piperacillin-tazobactam (ZOSYN) IVPB 2.25 g     2.25 g 100 mL/hr over 30 Minutes Intravenous 3 times per day 05/08/13 2156     05/08/13 2200  vancomycin (VANCOCIN) 2,000 mg in sodium chloride 0.9 % 500 mL IVPB     2,000 mg 250 mL/hr over 120 Minutes Intravenous  Once 05/08/13 2156 05/09/13 0102       Time Spent in minutes   35   Stephenie Navejas A M.D on 05/12/2013 at 12:33 PM  Between 7am to 7pm - Pager - [phone]  After 7pm go to [email or web address] - password [email or web address]  And look for the night coverage person covering for me after hours  [facility] Office  [phone]    Objective:   Filed Vitals:   05/12/13 0539 05/12/13 0734 05/12/13 1023 05/12/13 1140  BP: 165/84 168/84 171/94 151/92  Pulse: 80  84 83  Temp: 98.9 F (37.2 C)     TempSrc: Oral     Resp: 16     Height:      Weight: 90.2  kg (198 lb 13.7 oz)     SpO2: 98%       Wt Readings from Last 3 Encounters:  05/12/13 90.2 kg (198 lb 13.7 oz)  05/12/13 90.2 kg (198 lb 13.7 oz)  05/12/13 90.2 kg (198 lb 13.7 oz)     Intake/Output Summary (Last 24 hours) at 05/12/13 1233 Last data filed at 05/12/13 1113  Gross per 24 hour  Intake   1100 ml  Output    351 ml  Net    749 ml    Exam Awake Alert, Oriented X 3, No new F.N deficits, Normal affect [place].AT,PERRAL Supple Neck,No JVD, No cervical lymphadenopathy appriciated.  Symmetrical Chest wall movement, Good air movement bilaterally, CTAB RRR,No Gallops,Rubs or new Murmurs, No Parasternal Heave +ve B.Sounds, Abd Soft, Non tender, No organomegaly appriciated, No rebound - guarding or rigidity. No Cyanosis, Clubbing or edema, No new Rash or bruise,  left first toe is cyanotic, there is old blood and hematoma under the left first toe toenail, there is loss of sensation in all toes on the left foot, sensation is poor but better than the right foot,   Data Review   Micro Results Recent Results (from the past 240 hour(s))  WOUND CULTURE     Status: None   Collection Time    05/08/13 10:32 PM      Result Value Range Status   Specimen Description WOUND   Final   Special Requests LEFT GREAT TOE   Final   Gram Stain     Final   Value: NO WBC SEEN     NO SQUAMOUS EPITHELIAL CELLS SEEN     RARE GRAM POSITIVE COCCI IN PAIRS     RARE YEAST     Performed at [facility]   Culture     Final   Value: MODERATE GROUP B STREP(S.AGALACTIAE)ISOLATED     Note: TESTING AGAINST S. AGALACTIAE NOT ROUTINELY PERFORMED DUE TO PREDICTABILITY OF AMP/PEN/VAN SUSCEPTIBILITY.     Performed at [facility]   Report Status 05/11/2013 FINAL   Final  MRSA PCR SCREENING     Status: None   Collection Time    05/11/13  5:13 PM      Result Value Range Status   MRSA by PCR NEGATIVE  NEGATIVE Final   Comment:            The  GeneXpert MRSA Assay (FDA     approved for NASAL  specimens     only), is one component of a     comprehensive MRSA colonization     surveillance program. It is not     intended to diagnose MRSA     infection nor to guide or     monitor treatment for     MRSA infections.  SURGICAL PCR SCREEN     Status: None   Collection Time    05/12/13 12:12 AM      Result Value Range Status   MRSA, PCR NEGATIVE  NEGATIVE Final   Staphylococcus aureus NEGATIVE  NEGATIVE Final   Comment:            The Xpert SA Assay (FDA     approved for NASAL specimens     in patients over 67 years of age),     is one component of     a comprehensive surveillance     program.  Test performance has     been validated by [employer] for patients greater     than or equal to 29 year old.     It is not intended     to diagnose infection nor to     guide or monitor treatment.    Radiology Reports Dg Foot Complete Left  05/08/2013   CLINICAL DATA:  Infected left great toe  EXAM: LEFT FOOT - COMPLETE 3+ VIEW  COMPARISON:  None.  FINDINGS: No fracture or dislocation is seen.  Deformity at the 1st IP joint, favored to be chronic. No definite cortical regularity to suggest acute osteomyelitis.  Mild soft tissue swelling along the 1st digit.  Postsurgical changes involvement the distal tibia/fibula. The healing tibial fracture deformity. Fracture lucency remains visible.  IMPRESSION: No radiographic findings specific for acute osteomyelitis.  Deformity at the 1st IP joint, favored to be chronic.  Healing fracture deformity involving the distal tibia/fibula, status post ORIF.   Electronically Signed   By: Charline Bills M.D.   On: 05/08/2013 17:52    CBC  Recent Labs Lab 05/08/13 1806 05/09/13 0345 05/11/13 0703  WBC 7.3 7.8 14.0*  HGB 12.5* 11.8* 10.9*  HCT 39.5 36.7* 33.6*  PLT 398 374 388  MCV 72.7* 72.4* 71.6*  MCH 23.0* 23.3* 23.2*  MCHC 31.6 32.2 32.4  RDW 25.7* 25.7* 25.3*  LYMPHSABS 1.3  --   --   MONOABS 0.9  --   --   EOSABS 0.1  --   --    BASOSABS 0.0  --   --     Chemistries   Recent Labs Lab 05/08/13 1806 05/09/13 0345 05/11/13 0703  NA 132* 131* 132*  K 4.8 4.0 4.3  CL 89* 90* 91*  CO2 28 25 27   GLUCOSE 139* 155* 167*  BUN 36* 39* 32*  CREATININE 9.27* 9.68* 8.63*  CALCIUM 10.2 9.6 9.6  AST 20  --   --   ALT 19  --   --   ALKPHOS 65  --   --   BILITOT 0.4  --   --    ------------------------------------------------------------------------------------------------------------------ estimated creatinine clearance is 10.2 ml/min (by C-G formula based on Cr of 8.63). ------------------------------------------------------------------------------------------------------------------ No results found for this basename: HGBA1C,  in the last 72 hours ------------------------------------------------------------------------------------------------------------------ No results found for this basename: CHOL, HDL, LDLCALC, TRIG, CHOLHDL, LDLDIRECT,  in the last 72 hours ------------------------------------------------------------------------------------------------------------------ No results found for this basename: TSH, T4TOTAL,  FREET3, T3FREE, THYROIDAB,  in the last 72 hours ------------------------------------------------------------------------------------------------------------------ No results found for this basename: VITAMINB12, FOLATE, FERRITIN, TIBC, IRON, RETICCTPCT,  in the last 72 hours  Coagulation profile No results found for this basename: INR, PROTIME,  in the last 168 hours  No results found for this basename: DDIMER,  in the last 72 hours  Cardiac Enzymes No results found for this basename: CK, CKMB, TROPONINI, MYOGLOBIN,  in the last 168 hours ------------------------------------------------------------------------------------------------------------------ No components found with this basename: POCBNP,

## 2013-05-13 DIAGNOSIS — I10 Essential (primary) hypertension: Secondary | ICD-10-CM

## 2013-05-13 LAB — RENAL FUNCTION PANEL
BUN: 29 mg/dL — ABNORMAL HIGH (ref 6–23)
Calcium: 9.4 mg/dL (ref 8.4–10.5)
Chloride: 90 mEq/L — ABNORMAL LOW (ref 96–112)
Creatinine, Ser: 7.53 mg/dL — ABNORMAL HIGH (ref 0.50–1.35)
GFR calc non Af Amer: 7 mL/min — ABNORMAL LOW (ref >90)
Glucose, Bld: 151 mg/dL — ABNORMAL HIGH (ref 70–99)
Phosphorus: 4.4 mg/dL (ref 2.3–4.6)
Sodium: 129 mEq/L — ABNORMAL LOW (ref 135–145)

## 2013-05-13 LAB — CBC
HCT: 32.3 % — ABNORMAL LOW (ref 39.0–52.0)
Hemoglobin: 10.4 g/dL — ABNORMAL LOW (ref 13.0–17.0)
MCH: 23.3 pg — ABNORMAL LOW (ref 26.0–34.0)
MCHC: 32.2 g/dL (ref 30.0–36.0)
MCV: 72.4 fL — ABNORMAL LOW (ref 78.0–100.0)
RBC: 4.46 MIL/uL (ref 4.22–5.81)
WBC: 7.8 10*3/uL (ref 4.0–10.5)

## 2013-05-13 LAB — GLUCOSE, CAPILLARY: Glucose-Capillary: 166 mg/dL — ABNORMAL HIGH (ref 70–99)

## 2013-05-13 MED ORDER — PENTAFLUOROPROP-TETRAFLUOROETH EX AERO
INHALATION_SPRAY | CUTANEOUS | Status: AC
  Administered 2013-05-13: 1
  Filled 2013-05-13: qty 103.5

## 2013-05-13 MED ORDER — DOXERCALCIFEROL 4 MCG/2ML IV SOLN
INTRAVENOUS | Status: AC
  Administered 2013-05-13: 2 ug via INTRAVENOUS
  Filled 2013-05-13: qty 2

## 2013-05-13 NOTE — Procedures (Signed)
Pt seen on HD.  Sp Lt great toe ray amputation.  Ap 200 Vp 200.  Pain controlled.  K3.9, will change to 4K bath

## 2013-05-13 NOTE — Evaluation (Signed)
Physical Therapy Evaluation Patient Details Name: Oscar Castillo MRN: [id number] DOB: 01/17/1951 Today's Date: 05/13/2013 Time: 0865-7846 PT Time Calculation (min): 24 min  PT Assessment / Plan / Recommendation History of Present Illness  Patient is a 62 yo male s/p ray amputation Lt foot (great toe).  Patient with multiple falls pta.  Clinical Impression  Patient presents with problems listed below.  Will benefit from acute PT to maximize independence prior to discharge.  Recommend SNF for continued therapy at discharge.    PT Assessment  Patient needs continued PT services    Follow Up Recommendations  SNF    Does the patient have the potential to tolerate intense rehabilitation      Barriers to Discharge Decreased caregiver support Wife works, so patient alone during day.    Equipment Recommendations  None recommended by PT    Recommendations for Other Services     Frequency Min 3X/week    Precautions / Restrictions Precautions Precautions: Fall Precaution Comments: Multiple falls pta Required Braces or Orthoses: Other Brace/Splint Other Brace/Splint: Post-op shoe Restrictions Weight Bearing Restrictions: Yes LLE Weight Bearing: Non weight bearing   Pertinent Vitals/Pain       Mobility  Bed Mobility Bed Mobility: Not assessed Transfers Transfers: Sit to Stand;Stand to Sit Sit to Stand: 3: Mod assist;With upper extremity assist;With armrests;From chair/3-in-1 Stand to Sit: 3: Mod assist;With upper extremity assist;With armrests;To chair/3-in-1 Details for Transfer Assistance: Verbal cues for hand placement and technique with NWB on LLE.  Assist needed to rise to standing and for balance. Ambulation/Gait Ambulation/Gait Assistance: 1: +2 Total assist Ambulation/Gait: Patient Percentage: 60% Ambulation Distance (Feet): 5 Feet Assistive device: Rolling walker Ambulation/Gait Assistance Details: Verbal cues for safe use of RW and gait sequence.  Cues to maintain  NWB LLE. Gait Pattern: Step-to pattern General Gait Details: General weakness impacting ability to use UE's to hop step.    Exercises     PT Diagnosis: Difficulty walking;Generalized weakness;Altered mental status  PT Problem List: Decreased strength;Decreased activity tolerance;Decreased balance;Decreased mobility;Decreased cognition;Decreased knowledge of use of DME;Decreased safety awareness;Decreased knowledge of precautions PT Treatment Interventions: DME instruction;Gait training;Functional mobility training;Therapeutic exercise;Cognitive remediation;Patient/family education     PT Goals(Current goals can be found in the care plan section) Acute Rehab PT Goals Patient Stated Goal: To walk PT Goal Formulation: With patient/family Time For Goal Achievement: 05/27/13 Potential to Achieve Goals: Good  Visit Information  Last PT Received On: 05/13/13 Assistance Needed: +2 History of Present Illness: Patient is a 62 yo male s/p ray amputation Lt foot (great toe).  Patient with multiple falls pta.       Prior Functioning  Home Living Family/patient expects to be discharged to:: Skilled nursing facility Living Arrangements: Spouse/significant other Home Equipment: Walker - 2 wheels;Cane - single point;Bedside commode;Shower seat Prior Function Level of Independence: Independent with assistive device(s);Needs assistance Gait / Transfers Assistance Needed: Using RW/cane.  Mult falls Communication Communication: No difficulties (Slow to respond) Dominant Hand: Right    Cognition  Cognition Arousal/Alertness: Awake/alert Behavior During Therapy: Flat affect Overall Cognitive Status: Impaired/Different from baseline Area of Impairment: Memory;Problem solving    Extremity/Trunk Assessment Upper Extremity Assessment Upper Extremity Assessment: Generalized weakness Lower Extremity Assessment Lower Extremity Assessment: Generalized weakness;LLE deficits/detail LLE Deficits /  Details: Decreased strength and ROM due to surgery   Balance    End of Session PT - End of Session Equipment Utilized During Treatment: Gait belt Activity Tolerance: Patient limited by fatigue Patient left: in chair;with call bell/phone within  reach;with family/visitor present Nurse Communication: Mobility status;Weight bearing status  GP     Vena Austria 05/13/2013, 6:48 PM Durenda Hurt. Renaldo Fiddler, [facility] Acute Rehab Services Pager [phone]

## 2013-05-13 NOTE — Plan of Care (Signed)
Problem: Phase III Progression Outcomes Goal: IV/normal saline lock discontinued Outcome: Adequate for Discharge KVO for antibiotics

## 2013-05-13 NOTE — Progress Notes (Signed)
Subjective: 1 Day Post-Op Procedure(s) (LRB): AMPUTATION RAY (Left) Patient reports pain as mild.    Objective: Vital signs in last 24 hours: Temp:  [97.4 F (36.3 C)-99.9 F (37.7 C)] 99.9 F (37.7 C) (10/18 1155) Pulse Rate:  [76-93] 90 (10/18 1155) Resp:  [16-23] 20 (10/18 1155) BP: (120-188)/(6-94) 165/85 mmHg (10/18 1155) SpO2:  [93 %-100 %] 93 % (10/18 1146) Weight:  [87.3 kg (192 lb 7.4 oz)-90.4 kg (199 lb 4.7 oz)] 87.3 kg (192 lb 7.4 oz) (10/18 1146)  Intake/Output from previous day: 10/17 0701 - 10/18 0700 In: 368 [P.O.:118; I.V.:250] Out: 801 [Urine:750; Stool:1; Blood:50] Intake/Output this shift: Total I/O In: -  Out: 2771 [Other:2771]   Recent Labs  05/11/13 0703 05/13/13 0705  HGB 10.9* 10.4*    Recent Labs  05/11/13 0703 05/13/13 0705  WBC 14.0* 7.8  RBC 4.69 4.46  HCT 33.6* 32.3*  PLT 388 382    Recent Labs  05/11/13 0703 05/13/13 0705  NA 132* 129*  K 4.3 3.9  CL 91* 90*  CO2 27 26  BUN 32* 29*  CREATININE 8.63* 7.53*  GLUCOSE 167* 151*  CALCIUM 9.6 9.4   No results found for this basename: LABPT, INR,  in the last 72 hours  dressing dry.   minimal pain.   Assessment/Plan: 1 Day Post-Op Procedure(s) (LRB): AMPUTATION RAY (Left) Up with therapy  OOB ot recliner.   YATES,MARK C 05/13/2013, 12:27 PM

## 2013-05-14 DIAGNOSIS — D638 Anemia in other chronic diseases classified elsewhere: Secondary | ICD-10-CM

## 2013-05-14 LAB — GLUCOSE, CAPILLARY
Glucose-Capillary: 211 mg/dL — ABNORMAL HIGH (ref 70–99)
Glucose-Capillary: 145 mg/dL — ABNORMAL HIGH (ref 70–99)

## 2013-05-14 MED ORDER — LABETALOL HCL 300 MG PO TABS
300.0000 mg | ORAL_TABLET | Freq: Three times a day (TID) | ORAL | Status: DC
  Administered 2013-05-14 – 2013-05-15 (×3): 300 mg via ORAL
  Filled 2013-05-14 (×6): qty 1

## 2013-05-14 MED ORDER — BISACODYL 10 MG RE SUPP: 10.0000 mg | Freq: Every day | RECTAL | Status: DC | PRN

## 2013-05-14 NOTE — Clinical Social Work Note (Signed)
Clinical Social Work Department CLINICAL SOCIAL WORK PLACEMENT NOTE 05/14/2013  Patient:  RAIDEN, HAYDU  Account Number:  [id number] Admit date:  05/08/2013  Clinical Social Worker:  Truman Hayward, LCSW  Date/time:  05/14/2013 02:00 PM  Clinical Social Work is seeking post-discharge placement for this patient at the following level of care:   SKILLED NURSING   (*CSW will update this form in Epic as items are completed)   05/14/2013  Patient/family provided with [facility] Department of Clinical Social Work's list of facilities offering this level of care within the geographic area requested by the patient (or if unable, by the patient's family).  05/14/2013  Patient/family informed of their freedom to choose among providers that offer the needed level of care, that participate in Medicare, Medicaid or managed care program needed by the patient, have an available bed and are willing to accept the patient.  05/14/2013  Patient/family informed of [facility]' ownership interest in [facility], as well as of the fact that they are under no obligation to receive care at this facility.  PASARR submitted to EDS on  PASARR number received from EDS on   FL2 transmitted to all facilities in geographic area requested by pt/family on  05/14/2013 FL2 transmitted to all facilities within larger geographic area on   Patient informed that his/her managed care company has contracts with or will negotiate with  certain facilities, including the following:     Patient/family informed of bed offers received:   Patient chooses bed at  Physician recommends and patient chooses bed at    Patient to be transferred to  on   Patient to be transferred to facility by   The following physician request were entered in Epic:   Additional Comments:

## 2013-05-14 NOTE — Progress Notes (Signed)
Physical Therapy Treatment Patient Details Name: Oscar Castillo MRN: [id number] DOB: 04-13-1951 Today's Date: 05/14/2013 Time: 4098-1191 PT Time Calculation (min): 25 min  PT Assessment / Plan / Recommendation  History of Present Illness Patient is a 62 yo male s/p ray amputation Lt foot (great toe).  Patient with multiple falls pta.   PT Comments   Pt requires max directional cues for tasks & to maintain NWBing LLE.  Cont to strongly recommend SNF at d/c to maximize independence.     Follow Up Recommendations  SNF     Does the patient have the potential to tolerate intense rehabilitation     Barriers to Discharge        Equipment Recommendations  None recommended by PT    Recommendations for Other Services    Frequency Min 3X/week   Progress towards PT Goals Progress towards PT goals: Progressing toward goals (slowly)  Plan Current plan remains appropriate    Precautions / Restrictions Precautions Precautions: Fall Precaution Comments: Multiple falls pta Required Braces or Orthoses: Other Brace/Splint Other Brace/Splint: Post-op shoe Restrictions Weight Bearing Restrictions: Yes LLE Weight Bearing: Non weight bearing   Pertinent Vitals/Pain No pain indicated.      Mobility  Bed Mobility Bed Mobility: Supine to Sit;Sitting - Scoot to Edge of Bed Supine to Sit: 4: Min guard;With rails;HOB flat Sitting - Scoot to Edge of Bed: 4: Min guard Details for Bed Mobility Assistance: Pt seemed to have difficulty problem solving how to get to EOB.  Cues for increased use of UE's to assist with transitional movements Transfers Transfers: Sit to Stand;Stand to Sit Sit to Stand: 3: Mod assist;With upper extremity assist;With armrests;From bed;From chair/3-in-1 Stand to Sit: 3: Mod assist;With upper extremity assist;With armrests;To chair/3-in-1 Details for Transfer Assistance: Performed 3x's.  Max directional cues for hand placement & LLE positioning to maintain NWBing.  (A) to  achieve standing, balance, controlled descent, & this therapist had to place foot underneath pt's ankle to keep pt's LLE positioned properly.  Ambulation/Gait Ambulation/Gait Assistance: 1: +2 Total assist (+2 to follow with recliner) Ambulation/Gait: Patient Percentage: 80% Ambulation Distance (Feet): 5 Feet Assistive device: Rolling walker Ambulation/Gait Assistance Details: Max cues for NWBing LLE & technique Gait Pattern: Step-to pattern General Gait Details: General weakness impacting ability to use UE's to hop step. Stairs: No Wheelchair Mobility Wheelchair Mobility: No      PT Goals (current goals can now be found in the care plan section) Acute Rehab PT Goals PT Goal Formulation: With patient/family Time For Goal Achievement: 05/27/13 Potential to Achieve Goals: Good  Visit Information  Last PT Received On: 05/14/13 Assistance Needed: +2 History of Present Illness: Patient is a 62 yo male s/p ray amputation Lt foot (great toe).  Patient with multiple falls pta.    Subjective Data      Cognition  Cognition Arousal/Alertness: Awake/alert Behavior During Therapy: Flat affect Overall Cognitive Status: Impaired/Different from baseline Area of Impairment: Following commands;Problem solving Following Commands: Follows one step commands with increased time;Follows multi-step commands inconsistently Problem Solving: Slow processing;Difficulty sequencing;Requires verbal cues;Requires tactile cues;Decreased initiation General Comments: Pt required step-by-step cueing for tasks.      Balance     End of Session PT - End of Session Equipment Utilized During Treatment: Gait belt Activity Tolerance: Patient limited by fatigue Patient left: in chair;with call bell/phone within reach;with family/visitor present Nurse Communication: Mobility status   GP     Lara Mulch 05/14/2013, 11:16 AM  Verdell Face, PTA [phone]  05/14/2013    

## 2013-05-14 NOTE — Progress Notes (Signed)
[facility]         This is late entry note                                                                       Patient Demographics Oscar Castillo, is a 62 y.o. male, DOB - [birth year], ION:[id number] Admit date - 05/08/2013   Admitting Physician Eduard Clos, MD Outpatient Primary MD for the patient is Oliver Barre, MD  LOS - 6   Chief Complaint  Patient presents with  . toe/foot infection       Subjective:   Wife at bedside, denies any complaints. Fever of 100.0 this a.m.  Assessment & Plan    Left great toe gangrene: -In settings of diabetic peripheral neuropathy and peripheral vascular disease. -Orthopedics evaluated the patient, status post first ray amputation. -Low grade fever, blood and urine culture resent. -Patient is on broad-spectrum antibiotics, Zosyn and vancomycin.  ESRD on hemodialysis - Tuesday Thursday and Saturday. Consulted nephrology for dialysis.  Hypertension. -Home medications continued, blood pressure consistently high. -Appreciate Dr. Edd Arbour help, labetalol increased to 3 times a day.  Diabetes mellitus type 2 with peripheral neuropathy -Hemoglobin A1c is 7.8 in February of 2014, check A1c., on carbohydrate modified diet. -Insulin sliding scale.  Peripheral vascular disease -X-rays shows calcification of the distal vessels.  Recent admission in May for GI bleed - has had extensive workup done at that time including EGD colonoscopy and capsule endoscopy. Closely follow CBC.  Chronic combined systolic and diastolic CHF EF last was 45% - presently compensated. Continue beta blocker.   Code Status: Full  Family Communication:    Disposition Plan:  TBD   Procedures  MRI L Foot   Consults  Ortho, Renal   DVT Prophylaxis  Heparin   Lab Results  Component Value Date   PLT 382 05/13/2013    Medications  Scheduled Meds: . amLODipine  10 mg Oral Daily  . brimonidine  1 drop Both Eyes BID  . brinzolamide  1 drop Both  Eyes BID  . calcium acetate  667 mg Oral TID WC  . docusate sodium  100 mg Oral Daily  . doxercalciferol  2 mcg Intravenous Q T,Th,Sa-HD  . heparin subcutaneous  5,000 Units Subcutaneous Q8H  . insulin aspart  0-9 Units Subcutaneous TID WC  . insulin glargine  5 Units Subcutaneous QHS  . labetalol  300 mg Oral TID  . latanoprost  1 drop Both Eyes QHS  . multivitamin  1 tablet Oral QHS  . piperacillin-tazobactam (ZOSYN)  IV  2.25 g Intravenous Q8H  . sodium chloride  3 mL Intravenous Q12H  . vancomycin  1,000 mg Intravenous Q T,Th,Sa-HD   Continuous Infusions: . sodium chloride 10 mL/hr at 05/13/13 2139   PRN Meds:.acetaminophen, acetaminophen, bisacodyl, cloNIDine, hydrALAZINE, HYDROcodone-acetaminophen, HYDROmorphone (DILAUDID) injection, metoCLOPramide (REGLAN) injection, metoCLOPramide, ondansetron (ZOFRAN) IV, ondansetron, oxyCODONE-acetaminophen, pantoprazole, traMADol, traZODone  Antibiotics     Anti-infectives   Start     Dose/Rate Route Frequency Ordered Stop   05/09/13 1200  vancomycin (VANCOCIN) IVPB 1000 mg/200 mL premix     1,000 mg 200 mL/hr over 60 Minutes Intravenous Every T-Th-Sa (Hemodialysis) 05/08/13 2156     05/08/13 2200  piperacillin-tazobactam (ZOSYN) IVPB 2.25 g  2.25 g 100 mL/hr over 30 Minutes Intravenous 3 times per day 05/08/13 2156     05/08/13 2200  vancomycin (VANCOCIN) 2,000 mg in sodium chloride 0.9 % 500 mL IVPB     2,000 mg 250 mL/hr over 120 Minutes Intravenous  Once 05/08/13 2156 05/09/13 0102       Time Spent in minutes   35   Arrin Ishler A M.D on 05/14/2013 at 12:26 PM  Between 7am to 7pm - Pager - [phone]  After 7pm go to [email or web address] - password [email or web address]  And look for the night coverage person covering for me after hours  [facility] Office  [phone]    Objective:   Filed Vitals:   05/13/13 1339 05/13/13 1713 05/13/13 2122 05/14/13 0655  BP: 157/91 152/88 140/75 184/93  Pulse: 90 89 88 90  Temp:  98.4 F (36.9 C)  99.4 F (37.4 C) 100 F (37.8 C)  TempSrc: Oral  Oral Oral  Resp: 18  18 18   Height:      Weight:    88 kg (194 lb 0.1 oz)  SpO2: 97%  94% 97%    Wt Readings from Last 3 Encounters:  05/14/13 88 kg (194 lb 0.1 oz)  05/14/13 88 kg (194 lb 0.1 oz)  05/14/13 88 kg (194 lb 0.1 oz)     Intake/Output Summary (Last 24 hours) at 05/14/13 1226 Last data filed at 05/13/13 1400  Gross per 24 hour  Intake    310 ml  Output      0 ml  Net    310 ml    Exam Awake Alert, Oriented X 3, No new F.N deficits, Normal affect [place].AT,PERRAL Supple Neck,No JVD, No cervical lymphadenopathy appriciated.  Symmetrical Chest wall movement, Good air movement bilaterally, CTAB RRR,No Gallops,Rubs or new Murmurs, No Parasternal Heave +ve B.Sounds, Abd Soft, Non tender, No organomegaly appriciated, No rebound - guarding or rigidity. No Cyanosis, Clubbing or edema, No new Rash or bruise,  left first toe is cyanotic, there is old blood and hematoma under the left first toe toenail, there is loss of sensation in all toes on the left foot, sensation is poor but better than the right foot,   Data Review   Micro Results Recent Results (from the past 240 hour(s))  WOUND CULTURE     Status: None   Collection Time    05/08/13 10:32 PM      Result Value Range Status   Specimen Description WOUND   Final   Special Requests LEFT GREAT TOE   Final   Gram Stain     Final   Value: NO WBC SEEN     NO SQUAMOUS EPITHELIAL CELLS SEEN     RARE GRAM POSITIVE COCCI IN PAIRS     RARE YEAST     Performed at [facility]   Culture     Final   Value: MODERATE GROUP B STREP(S.AGALACTIAE)ISOLATED     Note: TESTING AGAINST S. AGALACTIAE NOT ROUTINELY PERFORMED DUE TO PREDICTABILITY OF AMP/PEN/VAN SUSCEPTIBILITY.     Performed at [facility]   Report Status 05/11/2013 FINAL   Final  MRSA PCR SCREENING     Status: None   Collection Time    05/11/13  5:13 PM      Result Value Range  Status   MRSA by PCR NEGATIVE  NEGATIVE Final   Comment:            The GeneXpert MRSA Assay (FDA  approved for NASAL specimens     only), is one component of a     comprehensive MRSA colonization     surveillance program. It is not     intended to diagnose MRSA     infection nor to guide or     monitor treatment for     MRSA infections.  SURGICAL PCR SCREEN     Status: None   Collection Time    05/12/13 12:12 AM      Result Value Range Status   MRSA, PCR NEGATIVE  NEGATIVE Final   Staphylococcus aureus NEGATIVE  NEGATIVE Final   Comment:            The Xpert SA Assay (FDA     approved for NASAL specimens     in patients over 76 years of age),     is one component of     a comprehensive surveillance     program.  Test performance has     been validated by [employer] [facility] for patients greater     than or equal to 17 year old.     It is not intended     to diagnose infection nor to     guide or monitor treatment.    Radiology Reports Dg Foot Complete Left  05/08/2013   CLINICAL DATA:  Infected left great toe  EXAM: LEFT FOOT - COMPLETE 3+ VIEW  COMPARISON:  None.  FINDINGS: No fracture or dislocation is seen.  Deformity at the 1st IP joint, favored to be chronic. No definite cortical regularity to suggest acute osteomyelitis.  Mild soft tissue swelling along the 1st digit.  Postsurgical changes involvement the distal tibia/fibula. The healing tibial fracture deformity. Fracture lucency remains visible.  IMPRESSION: No radiographic findings specific for acute osteomyelitis.  Deformity at the 1st IP joint, favored to be chronic.  Healing fracture deformity involving the distal tibia/fibula, status post ORIF.   Electronically Signed   By: Charline Bills M.D.   On: 05/08/2013 17:52    CBC  Recent Labs Lab 05/08/13 1806 05/09/13 0345 05/11/13 0703 05/13/13 0705  WBC 7.3 7.8 14.0* 7.8  HGB 12.5* 11.8* 10.9* 10.4*  HCT 39.5 36.7* 33.6* 32.3*  PLT 398 374 388 382  MCV  72.7* 72.4* 71.6* 72.4*  MCH 23.0* 23.3* 23.2* 23.3*  MCHC 31.6 32.2 32.4 32.2  RDW 25.7* 25.7* 25.3* 25.2*  LYMPHSABS 1.3  --   --   --   MONOABS 0.9  --   --   --   EOSABS 0.1  --   --   --   BASOSABS 0.0  --   --   --     Chemistries   Recent Labs Lab 05/08/13 1806 05/09/13 0345 05/11/13 0703 05/13/13 0705  NA 132* 131* 132* 129*  K 4.8 4.0 4.3 3.9  CL 89* 90* 91* 90*  CO2 28 25 27 26   GLUCOSE 139* 155* 167* 151*  BUN 36* 39* 32* 29*  CREATININE 9.27* 9.68* 8.63* 7.53*  CALCIUM 10.2 9.6 9.6 9.4  AST 20  --   --   --   ALT 19  --   --   --   ALKPHOS 65  --   --   --   BILITOT 0.4  --   --   --    ------------------------------------------------------------------------------------------------------------------ estimated creatinine clearance is 11.5 ml/min (by C-G formula based on Cr of 7.53). ------------------------------------------------------------------------------------------------------------------ No results found for this basename:  HGBA1C,  in the last 72 hours ------------------------------------------------------------------------------------------------------------------ No results found for this basename: CHOL, HDL, LDLCALC, TRIG, CHOLHDL, LDLDIRECT,  in the last 72 hours ------------------------------------------------------------------------------------------------------------------ No results found for this basename: TSH, T4TOTAL, FREET3, T3FREE, THYROIDAB,  in the last 72 hours ------------------------------------------------------------------------------------------------------------------ No results found for this basename: VITAMINB12, FOLATE, FERRITIN, TIBC, IRON, RETICCTPCT,  in the last 72 hours  Coagulation profile No results found for this basename: INR, PROTIME,  in the last 168 hours  No results found for this basename: DDIMER,  in the last 72 hours  Cardiac Enzymes No results found for this basename: CK, CKMB, TROPONINI, MYOGLOBIN,  in the  last 168 hours ------------------------------------------------------------------------------------------------------------------ No components found with this basename: POCBNP,

## 2013-05-14 NOTE — Plan of Care (Signed)
Problem: Discharge Progression Outcomes Goal: Pain controlled with appropriate interventions Outcome: Completed/Met Date Met:  05/14/13 No complaints of pain during this shift     

## 2013-05-14 NOTE — Progress Notes (Signed)
Subjective: 2 Days Post-Op Procedure(s) (LRB): AMPUTATION RAY (Left) Patient reports pain as mild.    Objective: Vital signs in last 24 hours: Temp:  [98.4 F (36.9 C)-100 F (37.8 C)] 100 F (37.8 C) (10/19 0655) Pulse Rate:  [88-93] 90 (10/19 0655) Resp:  [18-20] 18 (10/19 0655) BP: (140-184)/(75-93) 184/93 mmHg (10/19 0655) SpO2:  [93 %-97 %] 97 % (10/19 0655) Weight:  [87.3 kg (192 lb 7.4 oz)-88 kg (194 lb 0.1 oz)] 88 kg (194 lb 0.1 oz) (10/19 0655)  Intake/Output from previous day: 10/18 0701 - 10/19 0700 In: 310 [P.O.:240; I.V.:70] Out: 2771  Intake/Output this shift:     Recent Labs  05/13/13 0705  HGB 10.4*    Recent Labs  05/13/13 0705  WBC 7.8  RBC 4.46  HCT 32.3*  PLT 382    Recent Labs  05/13/13 0705  NA 129*  K 3.9  CL 90*  CO2 26  BUN 29*  CREATININE 7.53*  GLUCOSE 151*  CALCIUM 9.4   No results found for this basename: LABPT, INR,  in the last 72 hours  dressing dry.    Assessment/Plan: 2 Days Post-Op Procedure(s) (LRB): AMPUTATION RAY (Left)   Needs foot elevated to help with pain .   No BM per pt . Dulcolax supp ordered.   Vonda Harth C 05/14/2013, 11:22 AM

## 2013-05-14 NOTE — Clinical Social Work Note (Signed)
Clinical Social Work Department BRIEF PSYCHOSOCIAL ASSESSMENT 05/14/2013  Patient:  TRAMPUS, MCQUERRY     Account Number:  [id number]     Admit date:  05/08/2013  Clinical Social Worker:  Doreene Nest  Date/Time:  05/14/2013 02:00 PM  Referred by:  Physician  Date Referred:  05/14/2013 Referred for  SNF Placement   Other Referral:   Interview type:  Patient Other interview type:    PSYCHOSOCIAL DATA Living Status:  WIFE Admitted from facility:   Level of care:   Primary support name:  Georga Hacking Primary support relationship to patient:  SPOUSE Degree of support available:   Very supportive    CURRENT CONCERNS Current Concerns  Post-Acute Placement   Other Concerns:    SOCIAL WORK ASSESSMENT / PLAN CSW spoke with pt and pt's wife about SNF placement.  Pt's wife states they are looking into family providing assistance at home, however they are okay with CSW looking into SNF's as a back up option.  CSW will begin placement process and continue to follow with assistance to discharge planning.   Assessment/plan status:  Psychosocial Support/Ongoing Assessment of Needs Other assessment/ plan:   Information/referral to community resources:    PATIENT'S/FAMILY'S RESPONSE TO PLAN OF CARE: Pt and pt's wife were thankful of CSW assistance with SNF as back up and discussing all options of discharge for pt.

## 2013-05-14 NOTE — Progress Notes (Signed)
[facility]         This is late entry note                                                                       Patient Demographics Oscar Castillo, is a 62 y.o. male, DOB - 07-22-1951, ZOX:[id number] Admit date - 05/08/2013   Admitting Physician Eduard Clos, MD Outpatient Primary MD for the patient is Oliver Barre, MD  LOS - 6   Chief Complaint  Patient presents with  . toe/foot infection       Subjective:   Seen while he was getting dialysis, no complaints, pain is controlled.  Assessment & Plan    Left great toe gangrene: -In settings of diabetic peripheral neuropathy and peripheral vascular disease. -Orthopedics evaluated the patient, status post first ray amputation. -Continue antibiotics for now.  ESRD on hemodialysis - Tuesday Thursday and Saturday. Consulted nephrology for dialysis.  Hypertension - continue home medications and patient also has been placed on clear IV hydralazine for systolic blood pressure more than 160.  Diabetes mellitus type 2 with peripheral neuropathy -Check hemoglobin A1c, on carbohydrate modified diet. -Insulin sliding scale.  Peripheral vascular disease -X-rays shows calcification of the distal vessels.  CBG (last 3)   Recent Labs  05/13/13 2123 05/14/13 0736 05/14/13 1140  GLUCAP 226* 132* 211*   Lab Results  Component Value Date   HGBA1C 7.8* 09/11/2012   Recent admission in May for GI bleed - has had extensive workup done at that time including EGD colonoscopy and capsule endoscopy. Closely follow CBC.  Chronic combined systolic and diastolic CHF EF last was 45% - presently compensated. Continue beta blocker.   Code Status: Full  Family Communication:    Disposition Plan:  TBD   Procedures  MRI L Foot   Consults  Ortho, Renal   DVT Prophylaxis  Heparin   Lab Results  Component Value Date   PLT 382 05/13/2013    Medications  Scheduled Meds: . amLODipine  10 mg Oral Daily  . brimonidine  1 drop  Both Eyes BID  . brinzolamide  1 drop Both Eyes BID  . calcium acetate  667 mg Oral TID WC  . docusate sodium  100 mg Oral Daily  . doxercalciferol  2 mcg Intravenous Q T,Th,Sa-HD  . heparin subcutaneous  5,000 Units Subcutaneous Q8H  . insulin aspart  0-9 Units Subcutaneous TID WC  . insulin glargine  5 Units Subcutaneous QHS  . labetalol  300 mg Oral TID  . latanoprost  1 drop Both Eyes QHS  . multivitamin  1 tablet Oral QHS  . piperacillin-tazobactam (ZOSYN)  IV  2.25 g Intravenous Q8H  . sodium chloride  3 mL Intravenous Q12H  . vancomycin  1,000 mg Intravenous Q T,Th,Sa-HD   Continuous Infusions: . sodium chloride 10 mL/hr at 05/13/13 2139   PRN Meds:.acetaminophen, acetaminophen, bisacodyl, cloNIDine, hydrALAZINE, HYDROcodone-acetaminophen, HYDROmorphone (DILAUDID) injection, metoCLOPramide (REGLAN) injection, metoCLOPramide, ondansetron (ZOFRAN) IV, ondansetron, oxyCODONE-acetaminophen, pantoprazole, traMADol, traZODone  Antibiotics     Anti-infectives   Start     Dose/Rate Route Frequency Ordered Stop   05/09/13 1200  vancomycin (VANCOCIN) IVPB 1000 mg/200 mL premix     1,000 mg 200 mL/hr over 60 Minutes  Intravenous Every T-Th-Sa (Hemodialysis) 05/08/13 2156     05/08/13 2200  piperacillin-tazobactam (ZOSYN) IVPB 2.25 g     2.25 g 100 mL/hr over 30 Minutes Intravenous 3 times per day 05/08/13 2156     05/08/13 2200  vancomycin (VANCOCIN) 2,000 mg in sodium chloride 0.9 % 500 mL IVPB     2,000 mg 250 mL/hr over 120 Minutes Intravenous  Once 05/08/13 2156 05/09/13 0102       Time Spent in minutes   35   Lenox Bink A M.D on 05/14/2013 at 12:25 PM  Between 7am to 7pm - Pager - [phone]  After 7pm go to [email or web address] - password [email or web address]  And look for the night coverage person covering for me after hours  [facility] Office  [phone]    Objective:   Filed Vitals:   05/13/13 1339 05/13/13 1713 05/13/13 2122 05/14/13 0655  BP: 157/91 152/88  140/75 184/93  Pulse: 90 89 88 90  Temp: 98.4 F (36.9 C)  99.4 F (37.4 C) 100 F (37.8 C)  TempSrc: Oral  Oral Oral  Resp: 18  18 18   Height:      Weight:    88 kg (194 lb 0.1 oz)  SpO2: 97%  94% 97%    Wt Readings from Last 3 Encounters:  05/14/13 88 kg (194 lb 0.1 oz)  05/14/13 88 kg (194 lb 0.1 oz)  05/14/13 88 kg (194 lb 0.1 oz)     Intake/Output Summary (Last 24 hours) at 05/14/13 1225 Last data filed at 05/13/13 1400  Gross per 24 hour  Intake    310 ml  Output      0 ml  Net    310 ml    Exam Awake Alert, Oriented X 3, No new F.N deficits, Normal affect [place].AT,PERRAL Supple Neck,No JVD, No cervical lymphadenopathy appriciated.  Symmetrical Chest wall movement, Good air movement bilaterally, CTAB RRR,No Gallops,Rubs or new Murmurs, No Parasternal Heave +ve B.Sounds, Abd Soft, Non tender, No organomegaly appriciated, No rebound - guarding or rigidity. No Cyanosis, Clubbing or edema, No new Rash or bruise,  left first toe is cyanotic, there is old blood and hematoma under the left first toe toenail, there is loss of sensation in all toes on the left foot, sensation is poor but better than the right foot,   Data Review   Micro Results Recent Results (from the past 240 hour(s))  WOUND CULTURE     Status: None   Collection Time    05/08/13 10:32 PM      Result Value Range Status   Specimen Description WOUND   Final   Special Requests LEFT GREAT TOE   Final   Gram Stain     Final   Value: NO WBC SEEN     NO SQUAMOUS EPITHELIAL CELLS SEEN     RARE GRAM POSITIVE COCCI IN PAIRS     RARE YEAST     Performed at [facility]   Culture     Final   Value: MODERATE GROUP B STREP(S.AGALACTIAE)ISOLATED     Note: TESTING AGAINST S. AGALACTIAE NOT ROUTINELY PERFORMED DUE TO PREDICTABILITY OF AMP/PEN/VAN SUSCEPTIBILITY.     Performed at [facility]   Report Status 05/11/2013 FINAL   Final  MRSA PCR SCREENING     Status: None   Collection Time     05/11/13  5:13 PM      Result Value Range Status   MRSA by PCR NEGATIVE  NEGATIVE Final  Comment:            The GeneXpert MRSA Assay (FDA     approved for NASAL specimens     only), is one component of a     comprehensive MRSA colonization     surveillance program. It is not     intended to diagnose MRSA     infection nor to guide or     monitor treatment for     MRSA infections.  SURGICAL PCR SCREEN     Status: None   Collection Time    05/12/13 12:12 AM      Result Value Range Status   MRSA, PCR NEGATIVE  NEGATIVE Final   Staphylococcus aureus NEGATIVE  NEGATIVE Final   Comment:            The Xpert SA Assay (FDA     approved for NASAL specimens     in patients over 71 years of age),     is one component of     a comprehensive surveillance     program.  Test performance has     been validated by [employer] for patients greater     than or equal to 60 year old.     It is not intended     to diagnose infection nor to     guide or monitor treatment.    Radiology Reports Dg Foot Complete Left  05/08/2013   CLINICAL DATA:  Infected left great toe  EXAM: LEFT FOOT - COMPLETE 3+ VIEW  COMPARISON:  None.  FINDINGS: No fracture or dislocation is seen.  Deformity at the 1st IP joint, favored to be chronic. No definite cortical regularity to suggest acute osteomyelitis.  Mild soft tissue swelling along the 1st digit.  Postsurgical changes involvement the distal tibia/fibula. The healing tibial fracture deformity. Fracture lucency remains visible.  IMPRESSION: No radiographic findings specific for acute osteomyelitis.  Deformity at the 1st IP joint, favored to be chronic.  Healing fracture deformity involving the distal tibia/fibula, status post ORIF.   Electronically Signed   By: Charline Bills M.D.   On: 05/08/2013 17:52    CBC  Recent Labs Lab 05/08/13 1806 05/09/13 0345 05/11/13 0703 05/13/13 0705  WBC 7.3 7.8 14.0* 7.8  HGB 12.5* 11.8* 10.9* 10.4*  HCT 39.5  36.7* 33.6* 32.3*  PLT 398 374 388 382  MCV 72.7* 72.4* 71.6* 72.4*  MCH 23.0* 23.3* 23.2* 23.3*  MCHC 31.6 32.2 32.4 32.2  RDW 25.7* 25.7* 25.3* 25.2*  LYMPHSABS 1.3  --   --   --   MONOABS 0.9  --   --   --   EOSABS 0.1  --   --   --   BASOSABS 0.0  --   --   --     Chemistries   Recent Labs Lab 05/08/13 1806 05/09/13 0345 05/11/13 0703 05/13/13 0705  NA 132* 131* 132* 129*  K 4.8 4.0 4.3 3.9  CL 89* 90* 91* 90*  CO2 28 25 27 26   GLUCOSE 139* 155* 167* 151*  BUN 36* 39* 32* 29*  CREATININE 9.27* 9.68* 8.63* 7.53*  CALCIUM 10.2 9.6 9.6 9.4  AST 20  --   --   --   ALT 19  --   --   --   ALKPHOS 65  --   --   --   BILITOT 0.4  --   --   --    ------------------------------------------------------------------------------------------------------------------  estimated creatinine clearance is 11.5 ml/min (by C-G formula based on Cr of 7.53). ------------------------------------------------------------------------------------------------------------------ No results found for this basename: HGBA1C,  in the last 72 hours ------------------------------------------------------------------------------------------------------------------ No results found for this basename: CHOL, HDL, LDLCALC, TRIG, CHOLHDL, LDLDIRECT,  in the last 72 hours ------------------------------------------------------------------------------------------------------------------ No results found for this basename: TSH, T4TOTAL, FREET3, T3FREE, THYROIDAB,  in the last 72 hours ------------------------------------------------------------------------------------------------------------------ No results found for this basename: VITAMINB12, FOLATE, FERRITIN, TIBC, IRON, RETICCTPCT,  in the last 72 hours  Coagulation profile No results found for this basename: INR, PROTIME,  in the last 168 hours  No results found for this basename: DDIMER,  in the last 72 hours  Cardiac Enzymes No results found for this  basename: CK, CKMB, TROPONINI, MYOGLOBIN,  in the last 168 hours ------------------------------------------------------------------------------------------------------------------ No components found with this basename: POCBNP,

## 2013-05-14 NOTE — Progress Notes (Signed)
Pt. Is experiencing altered mental status since this morning. Pt. Was able to feed self at lunch, then unable to feed self at supper. Patient was able to answer location, name, and birth date. Pt. Also refused his 1700 BP medicines. Vital signs are stable. Dr. Arthor Captain was notified and is aware of patient's condition.

## 2013-05-14 NOTE — Progress Notes (Signed)
S: no new CO.  O:BP 184/93  Pulse 90  Temp(Src) 100 F (37.8 C) (Oral)  Resp 18  Ht 5\' 10"  (1.778 m)  Wt 88 kg (194 lb 0.1 oz)  BMI 27.84 kg/m2  SpO2 97%  Intake/Output Summary (Last 24 hours) at 05/14/13 0854 Last data filed at 05/13/13 1400  Gross per 24 hour  Intake    310 ml  Output   2771 ml  Net  -2461 ml   Weight change: -3.1 kg (-6 lb 13.3 oz) NGE:XBMWU and alert CVS:RRR Resp:clear Abd:+ BS NTND Ext: no edema.  + bruit Lt AVF,  Lt foot bandaged NEURO:CNI Ox3 no asterixis   . amLODipine  10 mg Oral Daily  . brimonidine  1 drop Both Eyes BID  . brinzolamide  1 drop Both Eyes BID  . calcium acetate  667 mg Oral TID WC  . docusate sodium  100 mg Oral Daily  . doxercalciferol  2 mcg Intravenous Q T,Th,Sa-HD  . heparin subcutaneous  5,000 Units Subcutaneous Q8H  . insulin aspart  0-9 Units Subcutaneous TID WC  . insulin glargine  5 Units Subcutaneous QHS  . labetalol  300 mg Oral TID  . latanoprost  1 drop Both Eyes QHS  . multivitamin  1 tablet Oral QHS  . piperacillin-tazobactam (ZOSYN)  IV  2.25 g Intravenous Q8H  . sodium chloride  3 mL Intravenous Q12H  . vancomycin  1,000 mg Intravenous Q T,Th,Sa-HD   No results found. BMET    Component Value Date/Time   NA 129* 05/13/2013 0705   K 3.9 05/13/2013 0705   CL 90* 05/13/2013 0705   CO2 26 05/13/2013 0705   GLUCOSE 151* 05/13/2013 0705   BUN 29* 05/13/2013 0705   CREATININE 7.53* 05/13/2013 0705   CALCIUM 9.4 05/13/2013 0705   GFRNONAA 7* 05/13/2013 0705   GFRAA 8* 05/13/2013 0705   CBC    Component Value Date/Time   WBC 7.8 05/13/2013 0705   RBC 4.46 05/13/2013 0705   HGB 10.4* 05/13/2013 0705   HCT 32.3* 05/13/2013 0705   PLT 382 05/13/2013 0705   MCV 72.4* 05/13/2013 0705   MCH 23.3* 05/13/2013 0705   MCHC 32.2 05/13/2013 0705   RDW 25.2* 05/13/2013 0705   LYMPHSABS 1.3 05/08/2013 1806   MONOABS 0.9 05/08/2013 1806   EOSABS 0.1 05/08/2013 1806   BASOSABS 0.0 05/08/2013 1806      Assessment: 1. Dry gangrene lt Great toe, SP Ray amputation 2. HTN 3. DM 4. Anemia 5. ESRD Plan: 1. Increase labetalol to TID 2. Plan HD for tues   Abdulrahim Siddiqi T

## 2013-05-14 NOTE — Progress Notes (Signed)
ANTIBIOTIC CONSULT NOTE - Follow-up  Pharmacy Consult for Vancomycin and Zosyn Indication: Gangrenous toe wound  No Known Allergies  Patient Measurements: Height: 5\' 10"  (177.8 cm) Weight: 194 lb 0.1 oz (88 kg) IBW/kg (Calculated) : 73  Ht: 70 in  Wt: ~90 kg  Vital Signs: Temp: 100 F (37.8 C) (10/19 0655) Temp src: Oral (10/19 0655) BP: 184/93 mmHg (10/19 0655) Pulse Rate: 90 (10/19 0655) Intake/Output from previous day: 10/18 0701 - 10/19 0700 In: 310 [P.O.:240; I.V.:70] Out: 2771  Intake/Output from this shift:    Labs:  Recent Labs  05/13/13 0705  WBC 7.8  HGB 10.4*  PLT 382  CREATININE 7.53*   Estimated Creatinine Clearance: 11.5 ml/min (by C-G formula based on Cr of 7.53). No results found for this basename: VANCOTROUGH, Leodis Binet, VANCORANDOM, GENTTROUGH, GENTPEAK, GENTRANDOM, TOBRATROUGH, TOBRAPEAK, TOBRARND, AMIKACINPEAK, AMIKACINTROU, AMIKACIN,  in the last 72 hours   Microbiology: Recent Results (from the past 720 hour(s))  WOUND CULTURE     Status: None   Collection Time    05/08/13 10:32 PM      Result Value Range Status   Specimen Description WOUND   Final   Special Requests LEFT GREAT TOE   Final   Gram Stain     Final   Value: NO WBC SEEN     NO SQUAMOUS EPITHELIAL CELLS SEEN     RARE GRAM POSITIVE COCCI IN PAIRS     RARE YEAST     Performed at [facility]   Culture     Final   Value: MODERATE GROUP B STREP(S.AGALACTIAE)ISOLATED     Note: TESTING AGAINST S. AGALACTIAE NOT ROUTINELY PERFORMED DUE TO PREDICTABILITY OF AMP/PEN/VAN SUSCEPTIBILITY.     Performed at [facility]   Report Status 05/11/2013 FINAL   Final  MRSA PCR SCREENING     Status: None   Collection Time    05/11/13  5:13 PM      Result Value Range Status   MRSA by PCR NEGATIVE  NEGATIVE Final   Comment:            The GeneXpert MRSA Assay (FDA     approved for NASAL specimens     only), is one component of a     comprehensive MRSA colonization   surveillance program. It is not     intended to diagnose MRSA     infection nor to guide or     monitor treatment for     MRSA infections.  SURGICAL PCR SCREEN     Status: None   Collection Time    05/12/13 12:12 AM      Result Value Range Status   MRSA, PCR NEGATIVE  NEGATIVE Final   Staphylococcus aureus NEGATIVE  NEGATIVE Final   Comment:            The Xpert SA Assay (FDA     approved for NASAL specimens     in patients over 83 years of age),     is one component of     a comprehensive surveillance     program.  Test performance has     been validated by [employer] for patients greater     than or equal to 28 year old.     It is not intended     to diagnose infection nor to     guide or monitor treatment.   Assessment: 62 y.o. male presents with gangrenous toe wound continues  on vancomycin + zosyn. Temp is 100 and WBC is 7.8. Pt received vanc dose yesterday with HD; next planned HD will possibly be Tuesday 10/21.  Pt had Lft great toe amputation on 10/17.  Goal of Therapy:  Pre-HD vanc level 15-25 mcg/ml  Plan:  1. Continue vanc 1gm post-HD 2. Continue zosyn 2.25gm IV Q8H 3. F/u renal plans, C&S, clinical status and pre-HD level at [facility]  Anabel Bene, PharmD Clinical Pharmacist Resident Pager: [phone]

## 2013-05-15 ENCOUNTER — Encounter (HOSPITAL_COMMUNITY): Payer: Self-pay | Admitting: Orthopedic Surgery

## 2013-05-15 LAB — URINALYSIS, ROUTINE W REFLEX MICROSCOPIC
Glucose, UA: 250 mg/dL — AB
Hgb urine dipstick: NEGATIVE
Leukocytes, UA: NEGATIVE
Specific Gravity, Urine: 1.014 (ref 1.005–1.030)
Urobilinogen, UA: 0.2 mg/dL (ref 0.0–1.0)

## 2013-05-15 LAB — CBC
HCT: 29.6 % — ABNORMAL LOW (ref 39.0–52.0)
Hemoglobin: 9 g/dL — ABNORMAL LOW (ref 13.0–17.0)
MCH: 22.4 pg — ABNORMAL LOW (ref 26.0–34.0)
MCHC: 30.4 g/dL (ref 30.0–36.0)
MCV: 73.8 fL — ABNORMAL LOW (ref 78.0–100.0)
RDW: 25.7 % — ABNORMAL HIGH (ref 11.5–15.5)

## 2013-05-15 LAB — BASIC METABOLIC PANEL
BUN: 25 mg/dL — ABNORMAL HIGH (ref 6–23)
CO2: 27 mEq/L (ref 19–32)
Calcium: 9.9 mg/dL (ref 8.4–10.5)
Creatinine, Ser: 8.21 mg/dL — ABNORMAL HIGH (ref 0.50–1.35)
GFR calc non Af Amer: 6 mL/min — ABNORMAL LOW (ref >90)
Glucose, Bld: 148 mg/dL — ABNORMAL HIGH (ref 70–99)
Sodium: 135 mEq/L (ref 135–145)

## 2013-05-15 LAB — GLUCOSE, CAPILLARY: Glucose-Capillary: 175 mg/dL — ABNORMAL HIGH (ref 70–99)

## 2013-05-15 LAB — URINE MICROSCOPIC-ADD ON

## 2013-05-15 MED ORDER — DARBEPOETIN ALFA-POLYSORBATE 60 MCG/0.3ML IJ SOLN: 60.0000 ug | INTRAMUSCULAR | Status: DC

## 2013-05-15 MED ORDER — CEFAZOLIN 200 MG/ML FOR DIALYSIS: 1500.0000 mg | INJECTION | Freq: Once | INTRAOCULAR | Status: DC

## 2013-05-15 MED ORDER — DEXTROSE 5 % IV SOLN: 2.0000 g | INTRAVENOUS | Status: DC

## 2013-05-15 MED ORDER — LABETALOL HCL 300 MG PO TABS: 300.0000 mg | ORAL_TABLET | Freq: Three times a day (TID) | ORAL | Status: DC

## 2013-05-15 MED ORDER — SODIUM CHLORIDE 0.9 % IV SOLN: 2.0000 g | Freq: Once | INTRAVENOUS | Status: DC

## 2013-05-15 MED ORDER — HYDROCODONE-ACETAMINOPHEN 5-325 MG PO TABS: 1.0000 | ORAL_TABLET | Freq: Four times a day (QID) | ORAL | Status: DC | PRN

## 2013-05-15 MED ORDER — DOCUSATE SODIUM 100 MG PO CAPS: 100.0000 mg | ORAL_CAPSULE | Freq: Two times a day (BID) | ORAL | Status: DC

## 2013-05-15 NOTE — Discharge Summary (Signed)
Addendum  Patient seen and examined, chart and data base reviewed.  I agree with the above assessment and plan.  For full details please see Mrs. Algis Downs PA note.  S/P Lt first ray amputation for gangrene, had low grade fever, Ancef with HD for 3 more doses.    Clint Lipps, MD [facility] Pager: [phone] 05/15/2013, 2:44 PM

## 2013-05-15 NOTE — Care Management Note (Signed)
    Page 1 of 2   05/15/2013     5:10:58 PM   CARE MANAGEMENT NOTE 05/15/2013  Patient:  CAREEM, YASUI   Account Number:  [id number]  Date Initiated:  05/15/2013  Documentation initiated by:  Letha Cape  Subjective/Objective Assessment:   dx toe gangrene  admit- lives with spouse.  pt states he has a wheelchair at home.     Action/Plan:   pt eval- rec snf- pt refuses.   Anticipated DC Date:  05/15/2013   Anticipated DC Plan:  SKILLED NURSING FACILITY  In-house referral  Clinical Social Worker      DC Planning Services  CM consult      Choice offered to / List presented to:          [facility] arranged  HH-1 RN  HH-2 PT  HH-3 OT  HH-4 NURSE'S AIDE  HH-6 SOCIAL WORKER      HH agency  [facility]   Status of service:  Completed, signed off Medicare Important Message given?   (If response is "NO", the following Medicare IM given date fields will be blank) Date Medicare IM given:   Date Additional Medicare IM given:    Discharge Disposition:  HOME W HOME HEALTH SERVICES  Per UR Regulation:  Reviewed for med. necessity/level of care/duration of stay  If discussed at Long Length of Stay Meetings, dates discussed:    Comments:  05/15/13 17:07 Letha Cape RN, BSN [phone] patient lives with spouse, who works during the day. Patient refuses SNF,  chose [facility] for [facility], PT, OT, aide and Child psychotherapist.  Referral made to [facility] , Debbie notified. Soc will begin 24- 48 hrs post discharge.  Patient for dc today.

## 2013-05-15 NOTE — Progress Notes (Signed)
Patient ID: Oscar Castillo, male   DOB: 1950-11-16, 62 y.o.   MRN: [id number] Dressing clean and dry. Okay for discharge to home at this time. Keep dressing clean and dry minimize weightbearing. I will followup in the office in 2 weeks.

## 2013-05-15 NOTE — Progress Notes (Signed)
Physical Therapy Treatment Patient Details Name: Oscar Castillo MRN: [id number] DOB: September 06, 1950 Today's Date: 05/15/2013 Time: 5643-3295 PT Time Calculation (min): 10 min  PT Assessment / Plan / Recommendation  History of Present Illness Patient is a 62 yo male s/p ray amputation Lt foot (great toe).  Patient with multiple falls pta.   PT Comments   Pt continues to refuse SNF.  Very limited participation with PT today with pt stating, "I just want to sit."   Follow Up Recommendations  SNF (pt is refusing this so would recommend max HH services. Does not appear that he has 24 hour assist at home since wife works.  Also has steps to negotiate for in/out of house for HD.)     Does the patient have the potential to tolerate intense rehabilitation     Barriers to Discharge        Equipment Recommendations  Wheelchair (measurements PT)    Recommendations for Other Services    Frequency Min 3X/week   Progress towards PT Goals Progress towards PT goals: Not progressing toward goals - comment (self limiting)  Plan Discharge plan needs to be updated    Precautions / Restrictions Precautions Precautions: Fall Precaution Comments: Multiple falls pta Required Braces or Orthoses: Other Brace/Splint Other Brace/Splint: Post-op shoe Restrictions LLE Weight Bearing: Touchdown weight bearing   Pertinent Vitals/Pain No specific c/o's.    Mobility  Bed Mobility Supine to Sit: 3: Mod assist;HOB elevated Sitting - Scoot to Edge of Bed: 3: Mod assist Details for Bed Mobility Assistance: Difficulty problem solving how to get up to EOB. Transfers Sit to Stand: With upper extremity assist;From bed;1: +2 Total assist Sit to Stand: Patient Percentage: 70% Stand to Sit: With upper extremity assist;With armrests;To chair/3-in-1;1: +2 Total assist Stand to Sit: Patient Percentage: 70% Details for Transfer Assistance: Pt needed max verbal encouragement to move. Ambulation/Gait Ambulation/Gait  Assistance: 1: +2 Total assist Ambulation/Gait: Patient Percentage: 80% Ambulation Distance (Feet): 2 Feet Assistive device: Rolling walker Ambulation/Gait Assistance Details: Pt unable to maintain TDWB on lt foot. Max encouragement to mobilize. Gait Pattern: Step-to pattern;Decreased stance time - left;Decreased step length - right    Exercises     PT Diagnosis:    PT Problem List:   PT Treatment Interventions:     PT Goals (current goals can now be found in the care plan section)    Visit Information  Last PT Received On: 05/15/13 Assistance Needed: +2 History of Present Illness: Patient is a 62 yo male s/p ray amputation Lt foot (great toe).  Patient with multiple falls pta.    Subjective Data      Cognition  Cognition Arousal/Alertness: Awake/alert Behavior During Therapy: Flat affect Overall Cognitive Status: Impaired/Different from baseline Following Commands: Follows one step commands with increased time;Follows multi-step commands inconsistently Problem Solving: Slow processing;Difficulty sequencing;Requires verbal cues;Requires tactile cues;Decreased initiation General Comments: Pt required step-by-step cueing for tasks.  When asked how he was going to get up to EOB at home without assistance he said "let me think about it," and then promptly closed his eyes and didn't make any further attempt to move until assisted up.    Balance     End of Session PT - End of Session Equipment Utilized During Treatment: Gait belt Activity Tolerance: Other (comment);Patient limited by fatigue (self-limiting) Patient left: in chair;with call bell/phone within reach;with chair alarm set Nurse Communication: Mobility status   GP     [facility] 05/15/2013, 12:11 PM  [facility] PT [phone]

## 2013-05-15 NOTE — Plan of Care (Signed)
Problem: Discharge Progression Outcomes Goal: Discharge plan in place and appropriate Outcome: Not Met (add Reason) Patient needs SNF, but patient refuses. Patient unable to care for self at home.

## 2013-05-15 NOTE — Discharge Summary (Signed)
Addendum  Patient seen and examined, chart and data base reviewed.  I agree with the above assessment and plan.  For full details please see Mrs. Algis Downs PA note.  S/P Lt first ray amputation for gangrene, had low grade fever, Ancef with HD for 3 more doses.   Clint Lipps, MD [facility] Pager: [phone] 05/15/2013, 12:45 PM

## 2013-05-15 NOTE — Progress Notes (Signed)
S: no new CO  O:BP 156/75  Pulse 77  Temp(Src) 99.6 F (37.6 C) (Oral)  Resp 18  Ht 5\' 10"  (1.778 m)  Wt 91 kg (200 lb 9.9 oz)  BMI 28.79 kg/m2  SpO2 99%  Intake/Output Summary (Last 24 hours) at 05/15/13 1139 Last data filed at 05/15/13 0900  Gross per 24 hour  Intake  994.5 ml  Output     90 ml  Net  904.5 ml   Weight change: 3.7 kg (8 lb 2.5 oz) WUJ:WJXBJ and alert, withdrawn CVS:RRR Resp:clear Abd:+ BS NTND Ext: no edema.  + bruit Lt AVF,  Lt foot bandaged NEURO:CNI Ox3 no asterixis   . amLODipine  10 mg Oral Daily  . brimonidine  1 drop Both Eyes BID  . brinzolamide  1 drop Both Eyes BID  . calcium acetate  667 mg Oral TID WC  . docusate sodium  100 mg Oral Daily  . doxercalciferol  2 mcg Intravenous Q T,Th,Sa-HD  . heparin subcutaneous  5,000 Units Subcutaneous Q8H  . insulin aspart  0-9 Units Subcutaneous TID WC  . insulin glargine  5 Units Subcutaneous QHS  . labetalol  300 mg Oral TID  . latanoprost  1 drop Both Eyes QHS  . multivitamin  1 tablet Oral QHS  . piperacillin-tazobactam (ZOSYN)  IV  2.25 g Intravenous Q8H  . sodium chloride  3 mL Intravenous Q12H  . vancomycin  1,000 mg Intravenous Q T,Th,Sa-HD   No results found. BMET    Component Value Date/Time   NA 135 05/15/2013 0801   K 4.1 05/15/2013 0801   CL 96 05/15/2013 0801   CO2 27 05/15/2013 0801   GLUCOSE 148* 05/15/2013 0801   BUN 25* 05/15/2013 0801   CREATININE 8.21* 05/15/2013 0801   CALCIUM 9.9 05/15/2013 0801   GFRNONAA 6* 05/15/2013 0801   GFRAA 7* 05/15/2013 0801   CBC    Component Value Date/Time   WBC 9.9 05/15/2013 0801   RBC 4.01* 05/15/2013 0801   HGB 9.0* 05/15/2013 0801   HCT 29.6* 05/15/2013 0801   PLT 347 05/15/2013 0801   MCV 73.8* 05/15/2013 0801   MCH 22.4* 05/15/2013 0801   MCHC 30.4 05/15/2013 0801   RDW 25.7* 05/15/2013 0801   LYMPHSABS 1.3 05/08/2013 1806   MONOABS 0.9 05/08/2013 1806   EOSABS 0.1 05/08/2013 1806   BASOSABS 0.0 05/08/2013 1806    Dialysis Orders: [facility]  EDW 90 kg HD Bath 2K/2Ca Time 4:15 Heparin 5000 bolus/ 2400 mid. Access L AVF Hectorol 2 mcg IV/HD Epogen 0 Units IV/HD Venofer 0  Recent Labs: Hgb 12.3, P 5.0. Last Tsat 21%,    Assessment: 1. Dry gangrene L great toe, SP Ray amputation 2. HTN, vol excess 3. DM 4. Anemia Hb 9, start aranesp 5. ESRD 6. GIB in May this year, extensive w/u   Plan: 1. Increased labetalol to TID 2. Plan HD tues, max UF as tol 3. Start Audree Bane  MD Pager [phone]    Cell  [phone] 05/15/2013, 11:45 AM

## 2013-05-15 NOTE — Discharge Summary (Addendum)
Physician Discharge Summary  Oscar PETROSYAN ZOX:[id number] DOB: [birth year] DOA: 05/08/2013  PCP: Oliver Barre, MD  Admit date: 05/08/2013 Discharge date: 05/15/2013  Time spent: 50  minutes  Recommendations for Outpatient Follow-up:  Darbepoetin to be started on 10/21 in hemodialysis See Dr. Lajoyce Corners with in 2 weeks See Dr. Jonny Ruiz with in 2 weeks.  Check finalized blood cultures from 10/19, DM management.  HTN management. Home health Physical Therapy and RN Patient to receive antibiotics (ancef) in hemodialysis for 1 week. ( 3 doses)   Discharge Diagnoses:  Principal Problem:   Toe gangrene Active Problems:   HYPERTENSION   Open toe wound   ESRD on dialysis   DM (diabetes mellitus), type 2, uncontrolled   Discharge Condition: stable.  Diet recommendation: renal  Filed Weights   05/13/13 1146 05/14/13 0655 05/15/13 0531  Weight: 87.3 kg (192 lb 7.4 oz) 88 kg (194 lb 0.1 oz) 91 kg (200 lb 9.9 oz)    History of present illness at the time of admission:  CC: Bleeding left toe wound.   Oscar Castillo is a 62 y.o. male with known history of ESRD on hemodialysis, diabetes mellitus type 2, hypertension was brought to the ER with a bleeding wound on the left toe. Patient sustained an injury 3 days ago after a fall at his house. Yesterday the patient's left toe started getting black in color.  Denies any fever chills.  X-rays do not reveal any active changes. Orthopedics was consulted and patient has been started empiric antibiotics for possible cellulitis and gangrene.   Hospital Course:   Left great toe gangrene:  In setting of diabetic peripheral neuropathy and peripheral vascular disease.  Orthopedics evaluated the patient, and proceeded with first ray amputation on 10/17. Low grade fever arose on 10/19, blood and urine culture resent. Urine cx negative.  Blood cultures still pending, but patient is now afebrile. Patient with treated with broad-spectrum antibiotics, Zosyn and vancomycin  as an inpatient. Low grade fevers were discussed with ID (Dr. Luciana Axe) who recommended treatment with Ancef in hemodialysis 15 - 20 mg/kg.  Will prescribe 3 doses of ancef in HD.  ESRD on hemodialysis Tuesday Thursday and Saturday.  Darbepoetin to be started on 10/21 in hemodialysis  Hypertension.  Home medications continued, however blood pressure remained consistently high.  Much improved on labetalol 300 mg TID.   Diabetes mellitus type 2 with peripheral neuropathy  Hemoglobin A1c is 7.8 in February of 2014,   on carbohydrate modified diet.  Lantus at home.  Will need PCP follow up for DM management.  Peripheral vascular disease  X-rays shows calcification of the distal vessels.   Recent admission in May for GI bleed - has had extensive workup done including EGD colonoscopy and capsule endoscopy.  Follow up cbc will be checked in hemodialysis  Chronic combined systolic and diastolic  CHF EF last was 45% - presently compensated. Continue beta blocker.    Procedures:  S/P first ray amputation on 10/17  Consultations:  Orthopedic surgery  Renal  Discharge Exam: Filed Vitals:   05/15/13 1052  BP: 156/75  Pulse: 77  Temp:   Resp:    Awake Alert, Oriented X 3, Appears non-toxic, flat affect. Supple Neck,No JVD Symmetrical Chest wall movement, Good air movement bilaterally, CTAB  RRR, no m/r/g +ve B.Sounds, Abd Soft, Non tender, No organomegaly appriciated, No rebound - guarding or rigidity.  No Cyanosis, Clubbing or edema, No new Rash or bruise,  Left foot is wrapped well post  operatively.    Discharge Instructions      Discharge Orders   Future Appointments Provider Department Dept Phone   08/23/2013 3:30 PM Corwin Levins, MD [facility] [phone]   Future Orders Complete By Expires   Diet general  As directed    Comments:     RENAL DIET   Increase activity slowly  As directed    Non weight bearing  As directed    Questions:      Laterality:  left   Extremity:  Lower   Post-op shoe  As directed    Comments:     Left foot   Touch down weight bearing  As directed    Questions:     Laterality:  left   Extremity:  Lower       Medication List    STOP taking these medications       aspirin EC 81 MG tablet      TAKE these medications       amLODipine 10 MG tablet  Commonly known as:  NORVASC  Take 10 mg by mouth daily.     calcium acetate 667 MG capsule  Commonly known as:  PHOSLO  Take 2 capsules (1,334 mg total) by mouth 3 (three) times daily with meals.     darbepoetin 60 MCG/0.3ML Soln injection  Commonly known as:  ARANESP  Inject 0.3 mLs (60 mcg total) into the vein every Tuesday with hemodialysis.  Start taking on:  05/16/2013     dextrose 5 % SOLN 50 mL with ceFAZolin 1 G SOLR 2 g ivpb  Inject 2 g into the vein Every Tuesday,Thursday,and Saturday with dialysis.     docusate sodium 100 MG capsule  Commonly known as:  COLACE  Take 1 capsule (100 mg total) by mouth 2 (two) times daily.     HYDROcodone-acetaminophen 5-325 MG per tablet  Commonly known as:  NORCO/VICODIN  Take 1 tablet by mouth every 6 (six) hours as needed.     labetalol 300 MG tablet  Commonly known as:  NORMODYNE  Take 1 tablet (300 mg total) by mouth 3 (three) times daily.     LANTUS SOLOSTAR [place]  Inject 5 Units into the skin every evening.     pantoprazole 40 MG tablet  Commonly known as:  PROTONIX  Take 40 mg by mouth daily as needed (indigestion).     SIMBRINZA 1-0.2 % Susp  Generic drug:  Brinzolamide-Brimonidine  Place 1 drop into the right eye 2 (two) times daily.     traMADol 50 MG tablet  Commonly known as:  ULTRAM  Take 50 mg by mouth every 8 (eight) hours as needed for pain.     Travoprost (BAK Free) 0.004 % Soln ophthalmic solution  Commonly known as:  TRAVATAN  Place 1 drop into both eyes at bedtime.     traZODone 50 MG tablet  Commonly known as:  DESYREL  Take 50 mg by mouth at bedtime as  needed for sleep.       No Known Allergies Follow-up Information   Follow up with DUDA,MARCUS V, MD In 2 weeks.   Specialty:  Orthopedic Surgery   Contact information:   [address] [place] [address] [phone]       Follow up with Oliver Barre, MD. Schedule an appointment as soon as possible for a visit in 2 weeks.   Specialties:  Internal Medicine, Radiology   Contact information:   [address] [address] [phone]        The results of significant diagnostics from this hospitalization (including imaging, microbiology, ancillary and laboratory) are listed below for reference.    Significant Diagnostic Studies: Mr Foot Left Wo Contrast  05/10/2013   CLINICAL DATA:  Gangrene of the left great toe.  EXAM: MRI OF THE LEFT FOREFOOT WITHOUT CONTRAST  TECHNIQUE: Multiplanar, multisequence MR imaging was performed. No intravenous contrast was administered.  COMPARISON:  Radiographs dated 05/08/2013  FINDINGS: There is no evidence of osteomyelitis of the great toe or of other bones of the left forefoot. There is chronic deformity of the distal phalanx of the great toe which probably due to a remote fracture dislocation with fragmentation. There is secondary arthritic changes at the IP joint of the great toe. There is no soft tissue abscess or definable cellulitis.  Slight arthritis at the head of the 2nd metatarsal.  IMPRESSION: 1. No evidence of osteomyelitis of the left great toe or elsewhere in the left forefoot. 2. 2. Chronic probable posttraumatic deformity of the IP joint of the great toe.   Electronically Signed   By: Geanie Cooley M.D.   On: 05/10/2013 14:21   Dg Foot Complete Left  05/08/2013   CLINICAL DATA:  Infected left great toe  EXAM: LEFT FOOT - COMPLETE 3+ VIEW  COMPARISON:  None.  FINDINGS: No fracture or dislocation is seen.  Deformity at the 1st IP joint, favored to be chronic. No definite cortical regularity to suggest acute  osteomyelitis.  Mild soft tissue swelling along the 1st digit.  Postsurgical changes involvement the distal tibia/fibula. The healing tibial fracture deformity. Fracture lucency remains visible.  IMPRESSION: No radiographic findings specific for acute osteomyelitis.  Deformity at the 1st IP joint, favored to be chronic.  Healing fracture deformity involving the distal tibia/fibula, status post ORIF.   Electronically Signed   By: Charline Bills M.D.   On: 05/08/2013 17:52    Microbiology: Recent Results (from the past 240 hour(s))  WOUND CULTURE     Status: None   Collection Time    05/08/13 10:32 PM      Result Value Range Status   Specimen Description WOUND   Final   Special Requests LEFT GREAT TOE   Final   Gram Stain     Final   Value: NO WBC SEEN     NO SQUAMOUS EPITHELIAL CELLS SEEN     RARE GRAM POSITIVE COCCI IN PAIRS     RARE YEAST     Performed at [facility]   Culture     Final   Value: MODERATE GROUP B STREP(S.AGALACTIAE)ISOLATED     Note: TESTING AGAINST S. AGALACTIAE NOT ROUTINELY PERFORMED DUE TO PREDICTABILITY OF AMP/PEN/VAN SUSCEPTIBILITY.     Performed at [facility]   Report Status 05/11/2013 FINAL   Final  MRSA PCR SCREENING     Status: None   Collection Time    05/11/13  5:13 PM      Result Value Range Status   MRSA by PCR NEGATIVE  NEGATIVE Final   Comment:            The GeneXpert MRSA Assay (FDA     approved for NASAL specimens     only), is one component of a     comprehensive MRSA colonization     surveillance program. It is not     intended to diagnose MRSA     infection nor to  guide or     monitor treatment for     MRSA infections.  SURGICAL PCR SCREEN     Status: None   Collection Time    05/12/13 12:12 AM      Result Value Range Status   MRSA, PCR NEGATIVE  NEGATIVE Final   Staphylococcus aureus NEGATIVE  NEGATIVE Final   Comment:            The Xpert SA Assay (FDA     approved for NASAL specimens     in patients over 71  years of age),     is one component of     a comprehensive surveillance     program.  Test performance has     been validated by [employer] for patients greater     than or equal to 75 year old.     It is not intended     to diagnose infection nor to     guide or monitor treatment.  CULTURE, BLOOD (ROUTINE X 2)     Status: None   Collection Time    05/14/13  8:08 AM      Result Value Range Status   Specimen Description BLOOD RIGHT ARM   Final   Special Requests BOTTLES DRAWN AEROBIC AND ANAEROBIC 10 CC   Final   Culture  Setup Time     Final   Value: 05/14/2013 20:32     Performed at [facility]   Culture     Final   Value:        BLOOD CULTURE RECEIVED NO GROWTH TO DATE CULTURE WILL BE HELD FOR 5 DAYS BEFORE ISSUING A FINAL NEGATIVE REPORT     Performed at [facility]   Report Status PENDING   Incomplete  CULTURE, BLOOD (ROUTINE X 2)     Status: None   Collection Time    05/14/13 11:40 AM      Result Value Range Status   Specimen Description BLOOD RIGHT WRIST   Final   Special Requests BOTTLES DRAWN AEROBIC AND ANAEROBIC 10 CC   Final   Culture  Setup Time     Final   Value: 05/14/2013 20:31     Performed at [facility]   Culture     Final   Value:        BLOOD CULTURE RECEIVED NO GROWTH TO DATE CULTURE WILL BE HELD FOR 5 DAYS BEFORE ISSUING A FINAL NEGATIVE REPORT     Performed at [facility]   Report Status PENDING   Incomplete     Labs: Basic Metabolic Panel:  Recent Labs Lab 05/08/13 1806 05/09/13 0345 05/11/13 0703 05/13/13 0705 05/15/13 0801  NA 132* 131* 132* 129* 135  K 4.8 4.0 4.3 3.9 4.1  CL 89* 90* 91* 90* 96  CO2 28 25 27 26 27   GLUCOSE 139* 155* 167* 151* 148*  BUN 36* 39* 32* 29* 25*  CREATININE 9.27* 9.68* 8.63* 7.53* 8.21*  CALCIUM 10.2 9.6 9.6 9.4 9.9  PHOS  --   --  3.6 4.4  --    Liver Function Tests:  Recent Labs Lab 05/08/13 1806 05/11/13 0703 05/13/13 0705  AST 20  --   --   ALT 19  --    --   ALKPHOS 65  --   --   BILITOT 0.4  --   --   PROT 9.4*  --   --   ALBUMIN  3.3* 2.5* 2.4*   CBC:  Recent Labs Lab 05/08/13 1806 05/09/13 0345 05/11/13 0703 05/13/13 0705 05/15/13 0801  WBC 7.3 7.8 14.0* 7.8 9.9  NEUTROABS 5.0  --   --   --   --   HGB 12.5* 11.8* 10.9* 10.4* 9.0*  HCT 39.5 36.7* 33.6* 32.3* 29.6*  MCV 72.7* 72.4* 71.6* 72.4* 73.8*  PLT 398 374 388 382 347   BNP: BNP (last 3 results)  Recent Labs  06/11/12 0916  PROBNP 6937.0*   CBG:  Recent Labs Lab 05/14/13 1140 05/14/13 1713 05/14/13 2127 05/15/13 0737 05/15/13 1232  GLUCAP 211* 164* 145* 159* 175*       Signed:  Conley Canal [phone]  [facility] 05/15/2013, 2:27 PM

## 2013-05-17 ENCOUNTER — Inpatient Hospital Stay: Admit: 2013-05-17 | Source: Home / Self Care | Admitting: Internal Medicine

## 2013-05-17 LAB — CBC WITH AUTO DIFFERENTIAL
Basophils %: 0 % (ref 0–2)
Basophils Absolute: 0 E9/L (ref 0.00–0.20)
Eosinophils %: 0 % (ref 0–6)
Eosinophils Absolute: 0.01 E9/L — ABNORMAL LOW (ref 0.05–0.50)
Hematocrit: 47.6 % (ref 37.0–54.0)
Hemoglobin: 15.4 g/dL (ref 12.5–16.5)
Lymphocytes %: 8 % — ABNORMAL LOW (ref 20–42)
Lymphocytes Absolute: 1.18 E9/L — ABNORMAL LOW (ref 1.50–4.00)
MCH: 31.2 pg (ref 26.0–35.0)
MCHC: 32.3 % (ref 32.0–34.5)
MCV: 96.6 fL (ref 80.0–99.9)
MPV: 9 fL (ref 7.0–12.0)
Monocytes %: 6 % (ref 2–12)
Monocytes Absolute: 0.9 E9/L (ref 0.10–0.95)
Neutrophils %: 85 % — ABNORMAL HIGH (ref 43–80)
Neutrophils Absolute: 12.07 E9/L — ABNORMAL HIGH (ref 1.80–7.30)
Platelets: 265 E9/L (ref 130–450)
RBC: 4.93 E12/L (ref 3.80–5.80)
RDW: 13.1 fL (ref 11.5–15.0)
WBC: 14.2 E9/L — ABNORMAL HIGH (ref 4.5–11.5)

## 2013-05-17 LAB — PROTIME-INR
INR: 1.5
INR: >17.1
Protime: 16.3 s — ABNORMAL HIGH (ref 9.3–12.0)
Protime: 221.1 s — ABNORMAL HIGH (ref 9.3–12.0)

## 2013-05-17 LAB — URINALYSIS
Glucose, Ur: NEGATIVE mg/dL
Ketones, Urine: 15 mg/dL — AB
Nitrite, Urine: POSITIVE — AB
Protein, UA: >=300 mg/dL — AB
Specific Gravity, UA: 1.02 (ref 1.005–1.030)
Urobilinogen, Urine: 1 E.U./dL (ref <2.0)
pH, UA: 6.5 (ref 5.0–9.0)

## 2013-05-17 LAB — MICROSCOPIC URINALYSIS

## 2013-05-17 LAB — PREPARE PLASMA

## 2013-05-17 LAB — HEMOGLOBIN AND HEMATOCRIT
Hematocrit: 38.6 % (ref 37.0–54.0)
Hemoglobin: 12.9 g/dL (ref 12.5–16.5)

## 2013-05-17 LAB — TYPE AND SCREEN
ABO/Rh: A POS
Antibody Screen: NEGATIVE

## 2013-05-17 LAB — LACTIC ACID: Lactic Acid: 2.3 mmol/L — ABNORMAL HIGH (ref 0.5–2.2)

## 2013-05-17 MED ORDER — ACETAMINOPHEN 325 MG PO TABS
325 | ORAL | Status: DC | PRN
Start: 2013-05-17 — End: 2013-05-21

## 2013-05-17 MED ADMIN — morphine (PF) injection 4 mg: INTRAVENOUS | @ 14:00:00 | NDC 00409189101

## 2013-05-17 MED ADMIN — hydrALAZINE (APRESOLINE) injection 10 mg: INTRAVENOUS | @ 19:00:00 | NDC 63323061401

## 2013-05-17 MED ADMIN — dicyclomine (BENTYL) injection 20 mg: INTRAMUSCULAR | @ 14:00:00 | NDC 58914008052

## 2013-05-17 MED ADMIN — diltiazem 100 mg in dextrose 5 % 100 mL infusion (ADD-Vantage): INTRAVENOUS | NDC 00409710067

## 2013-05-17 MED ADMIN — isosorbide mononitrate (IMDUR) CR tablet 60 mg: ORAL | NDC 47781027509

## 2013-05-17 MED ADMIN — lisinopril (PRINIVIL;ZESTRIL) tablet 30 mg: ORAL | NDC 51079098317

## 2013-05-17 MED ADMIN — phytonadione (ADULT) (AQUA-MEPHYTON) 10 mg in dextrose 5 % 100 mL IVPB: INTRAVENOUS | @ 15:00:00 | NDC 00409915801

## 2013-05-17 MED ADMIN — hydrALAZINE (APRESOLINE) tablet 50 mg: ORAL | NDC 51079007517

## 2013-05-17 MED ADMIN — cloNIDine (CATAPRES) tablet 0.3 mg: ORAL | NDC 51079030001

## 2013-05-17 MED ADMIN — carvedilol (COREG) tablet 25 mg: ORAL | NDC 68462016505

## 2013-05-17 MED ADMIN — acetaminophen (TYLENOL) tablet 650 mg: ORAL | @ 19:00:00 | NDC 50580050130

## 2013-05-17 MED FILL — HYDRALAZINE HCL 25 MG PO TABS: 25 MG | ORAL | Qty: 2

## 2013-05-17 MED FILL — ISOSORBIDE MONONITRATE ER 60 MG PO TB24: 60 MG | ORAL | Qty: 1

## 2013-05-17 MED FILL — NORMAL SALINE FLUSH 0.9 % IV SOLN: 0.9 % | INTRAVENOUS | Qty: 50

## 2013-05-17 MED FILL — MORPHINE SULFATE (PF) 4 MG/ML IV SOLN: 4 mg/mL | INTRAVENOUS | Qty: 1

## 2013-05-17 MED FILL — LISINOPRIL 10 MG PO TABS: 10 MG | ORAL | Qty: 1

## 2013-05-17 MED FILL — HYDRALAZINE HCL 20 MG/ML IJ SOLN: 20 MG/ML | INTRAMUSCULAR | Qty: 1

## 2013-05-17 MED FILL — DILTIAZEM HCL 100 MG IV SOLR: 100 MG | INTRAVENOUS | Qty: 1

## 2013-05-17 MED FILL — CLONIDINE HCL 0.1 MG PO TABS: 0.1 MG | ORAL | Qty: 1

## 2013-05-17 MED FILL — CARVEDILOL 25 MG PO TABS: 25 MG | ORAL | Qty: 1

## 2013-05-17 MED FILL — LISINOPRIL 20 MG PO TABS: 20 MG | ORAL | Qty: 1

## 2013-05-17 MED FILL — VITAMIN K1 10 MG/ML IJ SOLN: 10 MG/ML | INTRAMUSCULAR | Qty: 1

## 2013-05-17 MED FILL — TYLENOL 325 MG PO TABS: 325 MG | ORAL | Qty: 2

## 2013-05-17 MED FILL — CLONIDINE HCL 0.2 MG PO TABS: 0.2 MG | ORAL | Qty: 1

## 2013-05-17 MED FILL — DICYCLOMINE HCL 10 MG/ML IM SOLN: 10 MG/ML | INTRAMUSCULAR | Qty: 2

## 2013-05-17 NOTE — ED Provider Notes (Signed)
HPI Comments: Patient presents with abdominal pain and hematuria. Patient states past 3 days he has had no appetite and a cramping abdominal pain in his right lower quadrant. States today woke up and urinated blood so decided to come in for evaluation. Patient was recently diagnosed with atrial fibrillation and placed on Coumadin. He has not had his INR checked recently. He denies any nausea or vomiting. States he has not moved his bowels in 3 days but states it is due to not eating. He does state he has a right inguinal hernia but has no pain at that site. No previous abdominal surgeries. No urinary complaints other than hematuria.    Patient is a 62 y.o. male presenting with abdominal pain. The history is provided by the patient.   Abdominal Pain  Pain location:  Generalized  Pain quality: cramping    Pain radiates to:  Does not radiate  Pain severity:  Moderate  Onset quality:  Gradual  Duration:  3 days  Timing:  Constant  Progression:  Worsening  Chronicity:  New  Context: not alcohol use, not awakening from sleep, not eating, not laxative use, not medication withdrawal, not previous surgeries, not recent illness, not recent sexual activity, not recent travel, not retching, not sick contacts, not suspicious food intake and not trauma    Relieved by:  Nothing  Worsened by:  Nothing tried  Ineffective treatments:  None tried  Associated symptoms: anorexia and hematuria    Associated symptoms: no chest pain, no chills, no cough, no diarrhea, no dysuria, no fever, no nausea, no shortness of breath, no sore throat and no vomiting    Risk factors: no alcohol abuse, no aspirin use, not elderly, has not had multiple surgeries, no NSAID use, not obese and no recent hospitalization        Review of Systems   Constitutional: Negative for fever and chills.   HENT: Negative for ear pain, sore throat and sinus pressure.    Eyes: Negative for pain, discharge and redness.   Respiratory: Negative for cough, shortness of  breath and wheezing.    Cardiovascular: Negative for chest pain.   Gastrointestinal: Positive for abdominal pain and anorexia. Negative for nausea, vomiting and diarrhea.   Genitourinary: Positive for hematuria. Negative for dysuria and frequency.   Musculoskeletal: Negative for back pain and arthralgias.   Skin: Negative for rash and wound.   Neurological: Negative for weakness and headaches.   Hematological: Negative for adenopathy.   All other systems reviewed and are negative.        Physical Exam   Constitutional: He is oriented to person, place, and time. He appears well-developed and well-nourished. No distress.   HENT:   Head: Normocephalic and atraumatic.   Mouth/Throat: Oropharynx is clear and moist.   Eyes: Conjunctivae and EOM are normal. Pupils are equal, round, and reactive to light.   Neck: Normal range of motion. Neck supple. No JVD present. No tracheal deviation present.   Cardiovascular: Normal rate and normal heart sounds.  An irregularly irregular rhythm present.   No murmur heard.  Pulmonary/Chest: Effort normal and breath sounds normal. No respiratory distress. He has no wheezes. He has no rales.   Abdominal: Soft. Bowel sounds are normal. There is generalized tenderness. There is no rebound and no guarding. A hernia is present. Hernia confirmed positive in the right inguinal area.   Generalized abdominal tenderness. Easily reducible, nontender right inguinal hernia.   Musculoskeletal: He exhibits no edema.   Neurological: He  is alert and oriented to person, place, and time. No cranial nerve deficit. Coordination normal.   Skin: Skin is warm and dry. No rash noted. He is not diaphoretic. No erythema. No pallor.   Psychiatric: He has a normal mood and affect. His behavior is normal. Judgment and thought content normal.   Nursing note and vitals reviewed.      Procedures    MDM    EKG Interpretation    Interpreted by emergency department physician  Rhythm: atrial fibrillation - controlled  Rate:  94  Axis: normal  Ectopy: premature ventricular contractions (unifocal)  Conduction: normal  ST Segments: nonspecific changes  T Waves: non specific changes  Q Waves: none  Clinical Impression: atrial fibrillation (chronic) with PVCs, nonspecific lateral ST-T changes, no previous ECG    11:21 AM  Discussed case with Dr. Jannifer Franklin (General Surgery).  Requests surgical resident to see patient.    --------------------------------------------- PAST HISTORY ---------------------------------------------  Past Medical History:  has a past medical history of Hypertension and Atrial fibrillation.    Past Surgical History:  has no past surgical history on file.    Social History:  reports that he has been smoking Cigarettes.  He has a 2.5 pack-year smoking history. He does not have any smokeless tobacco history on file. He reports that  drinks alcohol. He reports that he does not use illicit drugs.    Family History: family history is not on file.     The patient???s home medications have been reviewed.    Allergies: Review of patient's allergies indicates no known allergies.    -------------------------------------------------- RESULTS -------------------------------------------------    LABS:  Labs Reviewed   CBC WITH AUTO DIFFERENTIAL - Abnormal; Notable for the following:     WBC 14.2 (*)     Neutrophils Relative 85 (*)     Lymphocytes Relative 8 (*)     Neutrophils Absolute 12.07 (*)     Lymphocytes Absolute 1.18 (*)     Eosinophils Absolute 0.01 (*)     All other components within normal limits   PROTIME-INR - Abnormal; Notable for the following:     Protime 221.1 (*)     INR >17.1 (*)     All other components within normal limits    Narrative:     CALL  [facility]  HED tel. ,  Hematology results called to and read back by [facility], 05/17/2013  10:46, by Earnestine Leys   LACTIC ACID, PLASMA - Abnormal; Notable for the following:     Lactic Acid 2.3 (*)     All other components within normal limits   COMPREHENSIVE METABOLIC PANEL    URINALYSIS   PREPARE FRESH FROZEN PLASMA   TYPE AND SCREEN       RADIOLOGY:    Ct Abdomen Pelvis Wo Iv Contrast    05/17/2013   CT abdomen and pelvis without contrast 05/17/2013 10:53 a.m.   History: Right-sided pain. Hematuria. On Coumadin.   Comparison: None   Technique: Axial images from the lung bases through the ischial tuberosities without contrast. Sagittal and coronal reformations.   Findings:   Nodular density right lower lobe on slice 3 measures 7.6 mm. Patchy groundglass opacities in the right lung base which is somewhat nodular on slice 9. Groundglass nodule left lung base on slice 10 measures 18 x 19 mm of. Other patchy nodular groundglass opacities left lower lobe. No pneumothorax. No pleural effusion. Upper normal heart size. No pericardial effusion.   Evaluation of the solid organs  is limited due to the lack of IV contrast.   No definite focal liver lesions. Gallbladder, spleen, pancreas are unremarkable. Slight thickening of the adrenal glands.   5 mm hypodensity mid pole left kidney on slice 40, too small to characterize, statistically a cyst.   Hyperdensity in the right collecting system which may be blood. There is stranding of the right right retroperitoneum which is hyperdense. Mild dilation of the right collecting system down to level of the upper to mid pelvis. No obstructing calculus. 7 mm hypodensity mid to upper pole right kidney medially on slice 34.   No significant upper abdominal or retroperitoneal lymphadenopathy.   Atherosclerosis. No aneurysm. Ectasia infrarenal abdominal aorta (2.5 cm).   Unopacified small bowel is unremarkable. Normal appendix. Mild fecal retention.   In the pelvis, bladder is slightly thick wall anteriorly. Small inguinal hernias with fat. No pelvic lymphadenopathy. No free air in the abdomen or pelvis.   Degenerative changes of the spine. No suspicious osseous lesions.      05/17/2013   IMPRESSION: 1. Right-sided retroperitoneal hemorrhage. Hemorrhage in the  right collecting system which causes mild hydronephrosis and proximal hydroureter. Probably related to anticoagulated state. 2. Likely renal cysts, too small characterize. 3. Indeterminate groundglass nodules in the lower lobes. Recommend CT chest.   Discussed with Collins at 11:14 am. CRITICAL RESULTS.      ALERT:  THIS IS AN ABNORMAL REPORT.      ------------------------- NURSING NOTES AND VITALS REVIEWED ---------------------------   The nursing notes within the ED encounter and vital signs as below have been reviewed.   BP 144/91   Pulse 112   Temp(Src) 97.5 ??F (36.4 ??C) (Oral)   Resp 16   Ht 6' 3.5" (1.918 m)   Wt 182 lb (82.555 kg)   BMI 22.44 kg/m2   SpO2 98%  Oxygen Saturation Interpretation: Normal      ------------------------------------------ PROGRESS NOTES ------------------------------------------  Time: 11:20 AM  Re-evaluation.  Patient???s symptoms are improving  Repeat physical examination is not changed         Consultations:  Dr. Jannifer Franklin (above)    11:27 AM  Spoke with Dr. Lisabeth Register.  She will admit patient.  Requests we speak to Intensivist regarding possible MICU admission.    11:40 AM  Spoke with Dr. Buelah Manis.  He will accept to MICU.  Requests consult to vascular surgery.    11:41 AM  Spoke with Dr. Randa Ngo.  Requests 24Fr 3-way foley placed.    12:05 PM  Spoke with Dr. Sandi Carne (Vascular) and discussed case.      Counseling:   I have spoken with the patient and discussed today???s results, in addition to providing specific details for the plan of care and counseling regarding the diagnosis and prognosis.  Their questions are answered at this time and they are agreeable with the plan.      --------------------------------- ADDITIONAL PROVIDER NOTES ---------------------------------       This patient's ED course included: a personal history and physicial eaxmination, re-evaluation prior to disposition, multiple bedside re-evaluations, IV medications, cardiac monitoring and continuous pulse  oximetry    This patient has improved during their ED course.    Critical Care: Please note that the withdrawal or failure to initiate urgent interventions for this patient would likely result in a life threatening deterioration or permanent disability.      Accordingly this patient received 35 minutes of critical care time, excluding separately billable procedures.      DIAGNOSIS:  1. Retroperitoneal hematoma    2. Hematuria    3. Supratherapeutic INR    4. Atrial fibrillation    5. Inguinal hernia            Disposition: Admit to MICU  Condition:  serious          Eleanora Neighbor, DO  Resident  05/17/13 1224    ATTENDING PROVIDER ATTESTATION:     I have personally performed and/or participated in the history, exam, medical decision making, and procedures and agree with all pertinent clinical information.      I have also reviewed and agree with the past medical, family and social history unless otherwise noted.    I have discussed this patient in detail with the resident, and provided the instruction and education regarding retroperitoneal hematoma.    My findings/Plan: elevated INR, hematuria, abdnormal CT with bleeding into right renal collecting system, patient denies trauma. Admit for INR reversal  (initiated in ER), multiple consults. NV intact, HD stable.         Michaelene Song, DO  05/26/13 [phone]

## 2013-05-17 NOTE — ED Notes (Signed)
Tylenol ordered now for tempo of 99.4 will be given to patient.    Vela Prose, RN  05/17/13 [phone]

## 2013-05-17 NOTE — ED Notes (Signed)
Consent form signed by patient for [facility] and placed in chart.     Vela Prose, RN  05/17/13 1131

## 2013-05-17 NOTE — ED Notes (Signed)
First unit of FFP finished. Called and spoke with Marissa on MICU and she stated it was fine to bring up the other 3 units of FFP and she would infuse them. Also I made her aware that we are unsuccessful in inserting the 3 way irrigation catheter and they will attempt or notify Dr. Prince Rome for a consult.    Vela Prose, RN  05/17/13 [phone]

## 2013-05-17 NOTE — Other (Signed)
Generalized blood in urine awaiting Dr Michail SermonMemo for foley catheter

## 2013-05-17 NOTE — ED Notes (Signed)
Male RN attempted to insert the irrigation catheter and was unsuccessful. Dr. Reuben Likes notified of this.    Vela Prose, RN  05/17/13 1444

## 2013-05-17 NOTE — Consults (Signed)
05/17/2013 2:43 PM  Service: Urology  Group: [facility] (Zakhari Fogel/Ricchiuti)    Otelia Sergeant  [id number]     Chief Complaint: Retroperitoneal hematoma    History of Present Illness:  The patient is a 62 y.o. male patient who presents with  Pain  He was seen at the [place]  He found to have a right inguinal hernia  In the process of being cleared for surgery  He was started on coumadin  He did have some gross hematuria  He has not had gross hematuria in the past  He is voiding well  He has not seen a urologist in the past      Past Medical History   Diagnosis Date   ??? Hypertension    ??? Atrial fibrillation        History reviewed. No pertinent past surgical history.    Medications Prior to Admission:    Not in a hospital admission.    Allergies:    Review of patient's allergies indicates no known allergies.    Social History:    reports that he has been smoking Cigarettes.  He has a 2.5 pack-year smoking history. He does not have any smokeless tobacco history on file. He reports that  drinks alcohol. He reports that he does not use illicit drugs.    Family History:   Non-contributory to this urological problem  family history is not on file.    Review of Systems:  Respiratory: negative for cough and hemoptysis  Cardiovascular: negative for chest pain and dyspnea  Gastrointestinal: negative for abdominal pain, diarrhea, nausea and vomiting  Derm: negative for rash and skin lesion(s)  Neurological: negative for seizures and tremors  Endocrine: negative for diabetic symptoms including polydipsia and polyuria  GU: As above in the HPI, otherwise negative  All other reviews are negative    Physical Exam:   Vitals: BP 144/91   Pulse 112   Temp(Src) 97.5 ??F (36.4 ??C) (Oral)   Resp 16   Ht 6' 3.5" (1.918 m)   Wt 182 lb (82.555 kg)   BMI 22.44 kg/m2   SpO2 98%  General:  Awake, alert, oriented X 3.  Well developed, well nourished, well groomed.  No apparent distress.  HEENT:  Normocephalic, atraumatic.  Pupils equal, round.  No scleral  icterus.  No conjunctival injection.  Normal lips, teeth, and gums.  No nasal discharge.  Neck:  Supple, no masses.  Heart:  RRR  Lungs:  No audible wheezing.  Respirations symmetric and non-labored.  Abdomen:  soft, nontender, no masses, no organomegaly, no peritoneal signs, right inguinal hernia   Extremities:  No clubbing, cyanosis, or edema  Skin:  Warm and dry, no open lesions or rashes  Neuro:  Cranial nerves 2-12 intact, no focal deficits  Rectal: deferred  Genitalia:  Foley cler    Labs:   Recent Labs      05/17/13   1000   WBC  14.2*   RBC  4.93   HGB  15.4   HCT  47.6   MCV  96.6   MCH  31.2   MCHC  32.3   RDW  13.1   PLT  265   MPV  9.0       No results found for this basename: CREATININE,  in the last 72 hours  IMPRESSION:   1. Right-sided retroperitoneal hemorrhage. Hemorrhage in the right   collecting system which causes mild hydronephrosis and proximal   hydroureter. Probably related to anticoagulated state.  2. Likely renal cysts, too small characterize.   3. Indeterminate groundglass nodules in the lower lobes. Recommend   CT chest.      Discussed with Collins at 11:14 am. CRITICAL RESULTS.      ALERT: THIS IS AN ABNORMAL REPORT.       Images:          Assessment: Elizandro Laura 62 y.o. male     Right sided retroperitoneal hematoma  Gross hematuria    Plan:    CT reviewed  Watch h/h  Send urine C&S and cytology  Will need cysto in the future  May need repeat CT in the future    Redge Gainer Shiryl Ruddy, DO   [facility]

## 2013-05-17 NOTE — Progress Notes (Signed)
Called Dr Ricchiutti regarding the patient refusing the three way foley catheter

## 2013-05-17 NOTE — ED Notes (Signed)
Temp is 99.4 after 1 unit of FFP. Compared to 98.3 before unit of FFP. Dr. Reuben Likes notified and no new orders at this time.    Vela Prose, RN  05/17/13 [phone]

## 2013-05-17 NOTE — ED Notes (Signed)
Bedside report given to [facility] on MICU. He is aware that he 3 units of FFP will be transfused by him upstairs and that a 3 way foley catheter needs placed and if unsuccessful, Dr. Prince Rome needs consulted.    Vela Prose, RN  05/17/13 1544

## 2013-05-17 NOTE — Progress Notes (Signed)
[facility]   Department of Internal Medicine   Internal Medicine Residency Program  MICU Admit Note      Patient:  Dylan Beasley  MRN: [id number]  Date of Service: 05/17/2013    Chief Complaint for admit:  abd pain and hematuria  History Obtained From:  patient  PCP: No primary provider on file.  History of Present Illness:   The patient is a 62 y.o. male with known hx of HTN and recently found a-fib started on coumadin. Patient found blood in his urine this morning. At ER, his INR was more than 17 and given vit K as well as FFP. He also had abdominal pain, CT of abd shown retroperitoneal hematoma. Consulted Urology and General surgery. For better monitoring, transfer patient to MICU.    Review of systems, physical examination as per H&P that was done in coordination with PGY-1 resident whom I supervised while obtaining information from patient as well as performing review of systems and physical examination. Labs and Imaging reviewed. I have discussed our overall assessment and plan with PGY-1 resident in detail and agree with assessment and plan in H&P. Admission orders and further work-up orders placed in Epic.    Assessment and Plan     Patient Active Problem List   Diagnosis Code   ??? Hematuria 599.70   ??? Retroperitoneal bleeding 459.0       1. Supratherapeutic INR > 17.1  - hematuria since this morning   - initial Hg 15.4    - current BP OK  - giving FFP and vit K; hold coumadin  - f/u h&h and INR     2. Retroperitoneal hemorrhage  - 2/2 Problem #1?  - CT abd: hyperdense in right retroperitoneum, no aneurysm  - consulted surgery and urology       3. hematuria  - 2/2 problem #1  - urology consulted  - f/u PSA, urine cultures and cytology        4. hypertension  - dx 10 years  - resume home antihypertensives  - current BP is still high and in the context of retroperitoneal hematoma, will start cardizem drip to control SBP < 160 mmHg        5. A-fib with rate control  - [place] score 1  - on cardizem  drip for control BP and HR  - hold coumadin  - f/u INR        DVT/GI Prophylaxis: supretherapeutic INR, PCDs, patient is eating    Disposition: MICU    Case d/w Dr. Buelah Manis (MICU attending on call)      Isabelle Course, MD PGY-3 Internal Medicine Resident  6:18 PM 05/17/2013    Department of Internal Medicine  Division of Pulmonary, Critical Care and Sleep Medicine  ICU Attending Addendum    Attending Physician Statement  Ardis Fullwood was seen, examined and discussed with the multi-disciplinary ICU team during rounds. I have personally seen and examined the patient and the key elements of the encounter were performed by me.      The medications & laboratory data was discussed and adjusted where necessary. The radiographic images were reviewed either as a group or with radiologist if felt dis-concordant with the exam or history. The above findings were corroborated, plans confirmed and changes made if needed.   Family is updated at the bedside as available. Key issues of the case were discussed among consultants.  Critical Care time is documented if appropriate.     Briefly, Peyton Najjar  Happ is a 62 y.o. male main issue is  1. RPH with high INR  2. Vascular consult.   3. Correct coags.      All other information is noted in the above team note.    Melvern Banker, DO, FCCP, [place], [place]  Associate Professor of Internal Medicine  Director, Pulmonary Health and [facility]  Internal Medicine Faculty Attending

## 2013-05-17 NOTE — ED Notes (Signed)
Lab called with a critical result of a INR 18.8 Dr. Reuben Likes notified and will place orders.    Vela Prose, RN  05/17/13 1100

## 2013-05-17 NOTE — Other (Signed)
4 siderails critical care

## 2013-05-17 NOTE — Progress Notes (Signed)
ER Clinical Pharmacy Note:    Medication reconciliation performed.  Patient obtains his medications from the [place] clinic.  He states he was told to take 2 coumadin tablets daily, and he states they are supposed to be 3mg  tablets, however he also states the color of his coumadin tablets are "peach" colored.  The patient started his coumadin 3 weeks ago, but has yet to have his INR checked and it seems he has been taking 10mg  daily for 3 weeks.     Discussed with Dr. Reuben Likes and Dr. Thomasena Edis in ER,    Olga Coaster, PharmD, BCPS  05/17/2013  12:51 PM

## 2013-05-17 NOTE — Other (Signed)
Pink tinged urine no clots noted

## 2013-05-17 NOTE — H&P (Signed)
[facility]  Department of Internal Medicine  Internal Medicine Residency Program  History and Physical    Patient:  Dylan Beasley 62 y.o. male MRN: [id number]     Date of Service: 05/17/2013      Chief complaint: hematuria + supra theraputic INR    History of Present Illness   The patient is a 62 y.o. male with PMH of HTN , newly diagnosed AF (2-3  weeks ago) in [place] [facility] , started on coumadin 10 mg per pt , not frequently checking his  INR , last time it was checked  was 2 weeks ago per pt  " no body told me to check it " . When he woke this morning he took his warfarin dose and when he was passing urine he  noticed that it was red  , no dysuria , no change in frequency.  -denies bleeding from any other site , no hemoptysis , no change in stool color .  - Also states that he had generalized abdominal pain on presentation  , low appetite for the past 2 days with some nausea , no vomiting.Dylan Beasley was relieved by morphine injection given in ED  - When he presented to  the ED his INR was found to be >17 , urinary catheter could not be passed . Urology is consulted.  - Ct abdomen showed Right-sided retroperitoneal hemorrhage. Hemorrhage in the right collecting system which causes mild hydronephrosis and proximal hydroureter Likely renal cysts, too small characterize. Indeterminate groundglass nodules in the lower lobes.      Past Medical History:      Diagnosis Date   ??? Hypertension    ??? Atrial fibrillation    ??? HLD (hyperlipidemia)    ??? Inguinal hernia        Past Surgical History:    History reviewed. No pertinent past surgical history.    Medications Prior to Admission:    Prior to Admission medications    Medication Sig Start Date End Date Taking? Authorizing Provider   Vitamin D (CHOLECALCIFEROL) 1000 UNITS CAPS capsule Take 2,000 Units by mouth daily.   Yes Historical Provider, MD   isosorbide mononitrate (IMDUR) 60 MG CR tablet Take 60 mg by mouth daily.   Yes Historical Provider, MD   lisinopril  (PRINIVIL;ZESTRIL) 10 MG tablet Take 30 mg by mouth daily.   Yes Historical Provider, MD   carvedilol (COREG) 25 MG tablet Take 25 mg by mouth 2 times daily (with meals).   Yes Historical Provider, MD   hydrALAZINE (APRESOLINE) 50 MG tablet Take 50 mg by mouth 3 times daily.   Yes Historical Provider, MD   warfarin (COUMADIN) 5 MG tablet Take  by mouth daily.   Yes Historical Provider, MD   traMADol (ULTRAM) 50 MG tablet Take 50 mg by mouth 3 times daily.   Yes Historical Provider, MD   cloNIDine (CATAPRES) 0.3 MG tablet Take 1 tablet by mouth 2 times daily. 05/04/13  Yes Wanda Plump, MD       Allergies:  Review of patient's allergies indicates no known allergies.    Social History:   TOBACCO:   reports that he has been smoking Cigarettes.  He has a 2.5 pack-year smoking history. He does not have any smokeless tobacco history on file.  ETOH:   reports that  drinks alcohol.  OCCUPATION:      Family History:   History reviewed. No pertinent family history.    REVIEW OF SYSTEMS:  ??  Constitutional: No fever, no chill or change in weight; good appetite  ?? HEENT: No blurred vision, no ear problems, no sore throat, no running nose.  ?? Respiratory: No cough, no sputum, no pleuritic chest pain, no shortness of breath  ?? Cardiology: No angina, no dyspnea on exertion, no paroxysmal nocturnal dyspnea, no orthopnea, no palpitation, no leg swelling.   ?? Gastroenterology: No dysphagia, no reflux; no abdominal pain, no nausea or vomiting; no constipation or diarrhea. No blood in stool.    ?? Musculoskeletal: no joint pain, no myalgia, no change in range of movement  ?? Neurology: no focal weakness in extremities, no slurred speech, no double vision, no tingling or numbness sensation.   ?? Endocrinology: no temperature intolerance, no polyphagia, polydipsia or polyuria  ??   ?? Skin: no skin change noticed by patient  ?? Psychology: no depressed mood, no suicidal ideation    Physical Exam   ?? Vitals: BP 179/102   Pulse 89   Temp(Src)  99.2 ??F (37.3 ??C) (Oral)   Resp 18   Ht 6' 3.5" (1.918 m)   Wt 182 lb (82.555 kg)   BMI 22.44 kg/m2   SpO2 97%  ?? General Appearance: Alert, cooperative, no acute distress.  ?? HEENT: Normocephalic, atraumatic. PERLA, conjunctiva/corneas clear, EOM's intact, no pallor  or icterus.   ?? Neck: Supple, symmetrical, trachea midline, no cervical LAD. No thyroid enlargement/tenderness/nodules. No carotid bruit or JVD  ?? Chest wall: No tenderness or deformity, full & symmetric excursion  ?? Lung: Clear to auscultation bilaterally,  respirations unlabored. No rales/wheezing/rubs  ?? Heart: irregular irregular , 100 BPM , no murmur , no gallop  ?? Abdomen: Soft, non-tender , no guarding , no rigidity ,  turner sign -ve ,  cullen's sign -ve.  ?? Extremities:  Extremities normal, atraumatic, no cyanosis or edema. Pulses equal bilaterally  ?? Skin: Skin color, texture, turgor normal, no rashes or lesions  ?? Musculokeletal: No joint swelling, no muscle tenderness. ROM normal in all joints of extremities.   ?? Neurologic:   Alert&Oriented.    CNII-XII intact.   Normal and symmetric  strength in UEs and LEs,   DTRs 2+ and symmetric, Babinski sign is absent (flexor)       Labs and Imaging Studies   Basic Labs  CBC:   Lab Results   Component Value Date    WBC 14.2 05/17/2013    RBC 4.93 05/17/2013    HGB 15.4 05/17/2013    HCT 47.6 05/17/2013    PLT 265 05/17/2013    MCV 96.6 05/17/2013     BMP:    No results found for this basename: NA, K, CL, CO2, BUN, CREATININE, GLUCOSE, CALCIUM       Imaging Studies:  CT abdomen pelvis wo IV contrast  .1. Right-sided retroperitoneal hemorrhage. Hemorrhage in the right  collecting system which causes mild hydronephrosis and proximal  hydroureter. Probably related to anticoagulated state.  2. Likely renal cysts, too small characterize.  3. Indeterminate groundglass nodules in the lower lobes. Recommend  CT chest.    Resident's Assessment and PLan     1. Supra theraputic INR:    -INR =>17  HB=15.4   - pt  was on warfarin 10 mg for 2 weeks , not regularly checking INR.  -pt received Vitamin K (phytonadione 10 mg in d5 )  - will cx with FFP transfusion ( for total of 4 units ).  -UA= +ve for blood.  -will check stool for  FOBT.   2. Hematuria:    - first episode of hematuria was this morning , likely secondary to #1  - UA= blood ++ , red , wbc 5-10  - urology consulted : recommending cystoscopy and repeat CT in the future.  - PSA level  - Daily INR check.   3. Retroperitoneal hemorrhage :    - CT abdomen:Right-sided retroperitoneal hemorrhage. Hemorrhage in the right collecting system which causes mild hydronephrosis and proximal hydroureter Likely renal cysts, too small characterize. Indeterminate groundglass nodules in the lower lobes.    - currently pt has no abdominal pain , no turner sign , no collen's sign , no gaurding ,no rigidity.   4. Atrial fibrillation :    - pt newly diagnosed with AF 2-3 weeks ago  -pt was asymptomatic , however AF was discovered prior to an elective hernial repair surgery in [place] [facility].  - Pt states he took lovenox for 2 days , and was started on coumadin 10 mg , he did not follow up with INR , he is unsure what was his last INR level .   5. Ho HTN    -Bp= 179/102  -home meds = coreg 25 mg , clonidine 0.3 mg BID , hydralazine 50 mg TID , IMDUR 60 mg , lisinopril 30 mg.  - will start on diltiazem drip ( titrate for target BP <160 )   6. Ho inguinal hernia    - Pt was scheduled for hernial repair surgery 2-3 weeks ago in [place] hospital , however he was found to have AF prior to surgery so it was deferred until HR is under control.     7. DVT/ GI prophylaxis :    Low sodium diet / high INR         Dylan Beasley M.D., PGY1    Attending physician: Dr. Buelah Manis  Department of Internal Medicine  Division of Pulmonary, Critical Care and Sleep Medicine  ICU Attending Addendum    Attending Physician Statement  Dylan Beasley was seen, examined and discussed with the multi-disciplinary  ICU team during rounds. I have personally seen and examined the patient and the key elements of the encounter were performed by me.      The medications & laboratory data was discussed and adjusted where necessary. The radiographic images were reviewed either as a group or with radiologist if felt dis-concordant with the exam or history. The above findings were corroborated, plans confirmed and changes made if needed.   Family is updated at the bedside as available. Key issues of the case were discussed among consultants.  Critical Care time is documented if appropriate.     All other information is noted in the above team note.    Dylan Banker, DO, FCCP, [place], [place]  Associate Professor of Internal Medicine  Director, Pulmonary Health and [facility]  Internal Medicine Faculty Attending

## 2013-05-18 DIAGNOSIS — N186 End stage renal disease: Secondary | ICD-10-CM | POA: Diagnosis not present

## 2013-05-18 DIAGNOSIS — E1159 Type 2 diabetes mellitus with other circulatory complications: Secondary | ICD-10-CM | POA: Diagnosis not present

## 2013-05-18 LAB — CBC WITH AUTO DIFFERENTIAL
Basophils %: 0 % (ref 0–2)
Basophils Absolute: 0.03 E9/L (ref 0.00–0.20)
Eosinophils %: 1 % (ref 0–6)
Eosinophils Absolute: 0.06 E9/L (ref 0.05–0.50)
Hematocrit: 37.9 % (ref 37.0–54.0)
Hemoglobin: 12.5 g/dL (ref 12.5–16.5)
Lymphocytes %: 25 % (ref 20–42)
Lymphocytes Absolute: 2.65 E9/L (ref 1.50–4.00)
MCH: 31.6 pg (ref 26.0–35.0)
MCHC: 33 % (ref 32.0–34.5)
MCV: 95.9 fL (ref 80.0–99.9)
MPV: 8.2 fL (ref 7.0–12.0)
Monocytes %: 9 % (ref 2–12)
Monocytes Absolute: 0.98 E9/L — ABNORMAL HIGH (ref 0.10–0.95)
Neutrophils %: 65 % (ref 43–80)
Neutrophils Absolute: 6.76 E9/L (ref 1.80–7.30)
Platelets: 224 E9/L (ref 130–450)
RBC: 3.96 E12/L (ref 3.80–5.80)
RDW: 13.1 fL (ref 11.5–15.0)
WBC: 10.5 E9/L (ref 4.5–11.5)

## 2013-05-18 LAB — BASIC METABOLIC PANEL
BUN: 12 mg/dL (ref 8–23)
CO2: 27 mmol/L (ref 22–29)
Calcium: 9.4 mg/dL (ref 8.6–10.2)
Chloride: 103 mmol/L (ref 98–107)
Creatinine: 0.7 mg/dL (ref 0.7–1.2)
GFR African American: >60
GFR Non-African American: >60 mL/min/{1.73_m2} (ref >=60)
Glucose: 120 mg/dL — ABNORMAL HIGH (ref 74–109)
Potassium: 3.4 mmol/L — ABNORMAL LOW (ref 3.5–5.0)
Sodium: 142 mmol/L (ref 132–146)

## 2013-05-18 LAB — URINALYSIS
Glucose, Ur: NEGATIVE mg/dL
Ketones, Urine: NEGATIVE mg/dL
Leukocyte Esterase, Urine: NEGATIVE
Nitrite, Urine: NEGATIVE
Protein, UA: 100 mg/dL — AB
Specific Gravity, UA: 1.025 (ref 1.005–1.030)
Urobilinogen, Urine: 0.2 E.U./dL (ref <2.0)
pH, UA: 6 (ref 5.0–9.0)

## 2013-05-18 LAB — URINE DRUG SCREEN
Amphetamine Screen, Urine: NOT DETECTED (ref <1000)
Barbiturate Screen, Ur: NOT DETECTED (ref <200)
Benzodiazepine Screen, Urine: POSITIVE — AB (ref <200)
Cannabinoid Scrn, Ur: POSITIVE — AB
Cocaine Metabolite Screen, Urine: NOT DETECTED (ref <300)
Methadone Screen, Urine: NOT DETECTED (ref <300)
Opiate Scrn, Ur: POSITIVE — AB
PCP Screen, Urine: NOT DETECTED (ref <25)
Propoxyphene Scrn, Ur: NOT DETECTED (ref <300)

## 2013-05-18 LAB — HEMOGLOBIN AND HEMATOCRIT
Hematocrit: 38 % (ref 37.0–54.0)
Hemoglobin: 12.8 g/dL (ref 12.5–16.5)

## 2013-05-18 LAB — LIPID PANEL
Cholesterol, Total: 145 mg/dL (ref 0–199)
HDL: 46 mg/dL (ref >40)
LDL Calculated: 85 mg/dL (ref 0–99)
Triglycerides: 68 mg/dL (ref 0–149)
VLDL Cholesterol Calculated: 14 mg/dL

## 2013-05-18 LAB — HEMOGLOBIN A1C: Hemoglobin A1C: 6.6 % — ABNORMAL HIGH (ref 4.8–5.9)

## 2013-05-18 LAB — MICROALBUMIN / CREATININE URINE RATIO
Creatinine, Ur: 109 mg/dL (ref 40–278)
Microalbumin Creatinine Ratio: 19.2 (ref 0.0–30.0)
Microalbumin, Random Urine: 20.9 mg/L — ABNORMAL HIGH

## 2013-05-18 LAB — PROTIME-INR
INR: 1.3
Protime: 13.3 s — ABNORMAL HIGH (ref 9.3–12.0)

## 2013-05-18 LAB — MICROSCOPIC URINALYSIS: RBC, UA: >20 /HPF (ref 0–2)

## 2013-05-18 LAB — PSA, DIAGNOSTIC: Diagnostic Psa: 0.37 ng/mL (ref 0.00–4.00)

## 2013-05-18 LAB — LACTIC ACID
Lactic Acid: 1 mmol/L (ref 0.5–2.2)
Lactic Acid: 1.1 mmol/L (ref 0.5–2.2)

## 2013-05-18 MED ORDER — ACETAMINOPHEN 325 MG PO TABS
325 | ORAL | Status: DC | PRN
Start: 2013-05-18 — End: 2013-05-17

## 2013-05-18 MED ADMIN — hydrALAZINE (APRESOLINE) tablet 50 mg: ORAL | @ 04:00:00 | NDC 62584073311

## 2013-05-18 MED ADMIN — lisinopril (PRINIVIL;ZESTRIL) tablet 30 mg: ORAL | @ 12:00:00 | NDC 51079098317

## 2013-05-18 MED ADMIN — potassium chloride SA (K-DUR;KLOR-CON M) tablet 40 mEq: ORAL | @ 10:00:00 | NDC 00245005889

## 2013-05-18 MED ADMIN — hydrALAZINE (APRESOLINE) tablet 50 mg: ORAL | @ 12:00:00 | NDC 51079007517

## 2013-05-18 MED ADMIN — carvedilol (COREG) tablet 25 mg: ORAL | @ 12:00:00 | NDC 68001015203

## 2013-05-18 MED ADMIN — carvedilol (COREG) tablet 25 mg: ORAL | @ 21:00:00 | NDC 68001015203

## 2013-05-18 MED ADMIN — sodium chloride flush 0.9 % injection 10 mL: INTRAVENOUS | @ 13:00:00

## 2013-05-18 MED ADMIN — isosorbide mononitrate (IMDUR) CR tablet 60 mg: ORAL | @ 12:00:00 | NDC 00143226001

## 2013-05-18 MED ADMIN — cloNIDine (CATAPRES) tablet 0.3 mg: ORAL | @ 12:00:00 | NDC 51079030017

## 2013-05-18 MED ADMIN — diltiazem 100 mg in dextrose 5 % 100 mL infusion (ADD-Vantage): INTRAVENOUS | @ 06:00:00 | NDC 00409710067

## 2013-05-18 MED ADMIN — hydrALAZINE (APRESOLINE) tablet 50 mg: ORAL | @ 18:00:00 | NDC 51079007517

## 2013-05-18 MED FILL — HYDRALAZINE HCL 25 MG PO TABS: 25 MG | ORAL | Qty: 2

## 2013-05-18 MED FILL — CARVEDILOL 25 MG PO TABS: 25 MG | ORAL | Qty: 1

## 2013-05-18 MED FILL — ISOSORBIDE MONONITRATE ER 60 MG PO TB24: 60 MG | ORAL | Qty: 1

## 2013-05-18 MED FILL — CLONIDINE HCL 0.1 MG PO TABS: 0.1 MG | ORAL | Qty: 1

## 2013-05-18 MED FILL — NORMAL SALINE FLUSH 0.9 % IV SOLN: 0.9 % | INTRAVENOUS | Qty: 30

## 2013-05-18 MED FILL — KLOR-CON M20 20 MEQ PO TBCR: 20 MEQ | ORAL | Qty: 2

## 2013-05-18 MED FILL — DILTIAZEM HCL 100 MG IV SOLR: 100 MG | INTRAVENOUS | Qty: 1

## 2013-05-18 MED FILL — LISINOPRIL 10 MG PO TABS: 10 MG | ORAL | Qty: 1

## 2013-05-18 NOTE — Care Coordination-Inpatient (Signed)
Met with patient at bedside regarding transfer to [facility]- his choice is to remain at [facility] for treatment-  [facility] notified @ [phone] ex [phone]- left voice mail regarding patient's decision-

## 2013-05-18 NOTE — Progress Notes (Signed)
[facility]  Department of Internal Medicine   Internal Medicine Residency   MICU Progress Note    Patient:  Dylan Beasley 62 y.o. male  MRN: [id number]     Date of Service: 05/18/2013    Allergy: Review of patient's allergies indicates no known allergies.    Subjective       Patient is awake, alert, denies chest pain, sob, abdominal pain. INR this morning was 1.5, hgb 12.5. Bp stable at 130/87 this am but had some elevations up to 170 earlier. Patient refused insertion of foley catheter yesterday.     Objective     VS: BP 123/76   Pulse 83   Temp(Src) 98.4 ??F (36.9 ??C) (Oral)   Resp 13   Ht 6' 3.5" (1.918 m)   Wt 178 lb 12.7 oz (81.1 kg)   BMI 22.05 kg/m2   SpO2 97%  I & O - 24hr:   Intake/Output Summary (Last 24 hours) at 05/18/13 1406  Last data filed at 05/18/13 1200   Gross per 24 hour   Intake   2556 ml   Output    601 ml   Net   1955 ml       Physical Exam:  ?? Vitals: BP 123/76   Pulse 83   Temp(Src) 98.4 ??F (36.9 ??C) (Oral)   Resp 13   Ht 6' 3.5" (1.918 m)   Wt 178 lb 12.7 oz (81.1 kg)   BMI 22.05 kg/m2   SpO2 97%    ?? I & O - 24hr: I/O this shift:  ?? In: 740 [P.O.:740]  ?? Out: 250 [Urine:250]   ?? General Appearance: alert, appears stated age and cooperative  ?? HEENT:  Head: Normocephalic, no lesions, without obvious abnormality.  ?? Neck: no adenopathy, no carotid bruit, no JVD and supple, symmetrical, trachea midline  ?? Lung: clear to auscultation bilaterally  ?? Heart: irregular rhythm, regular rate, S1, S2 normal, no murmur, click, rub or gallop  ?? Abdomen: soft, non-tender; bowel sounds normal; no masses,  no organomegaly, + R inguinal hernia, nontender  ?? Extremities:  extremities normal, atraumatic, no cyanosis or edema  ?? Musculokeletal: No joint swelling, no muscle tenderness. ROM normal in all joints of extremities.   ?? Neurologic: Mental status: Alert, oriented, thought content appropriate  Lines     site day    Art line       TLC     PICC     Hemoaccess  peripheral 10/22     Oxygen:     ??? none  Mechanical Ventilation:  ??? N/A    ABG:    none    Medications     Infusions: (Fluid, Sedation, Vasopressors)  IVF:   ??? none  Vasopressors  ??? none  Sedations  ??? none    Nutrition:   Low sodium diet    ATB:   Antibiotics  Days   none              Skin issues: none  Patient currently has     Labs     CBC:   Lab Results   Component Value Date    WBC 10.5 05/18/2013    RBC 3.96 05/18/2013    HGB 12.5 05/18/2013    HCT 37.9 05/18/2013    MCV 95.9 05/18/2013    MCH 31.6 05/18/2013    MCHC 33.0 05/18/2013    RDW 13.1 05/18/2013    PLT 224 05/18/2013    MPV  8.2 05/18/2013     CMP:    Lab Results   Component Value Date    NA 142 05/18/2013    K 3.4 05/18/2013    CL 103 05/18/2013    CO2 27 05/18/2013    BUN 12 05/18/2013    CREATININE 0.7 05/18/2013    GFRAA >60 05/18/2013    LABGLOM >60 05/18/2013    GLUCOSE 120 05/18/2013    CALCIUM 9.4 05/18/2013     PT/INR:    Lab Results   Component Value Date    PROTIME 13.3 05/18/2013    INR 1.3 05/18/2013       Notable Cultures:    Urine culture - pending    Imaging Studies:    10/22 CT scan:   1. Right-sided retroperitoneal hemorrhage. Hemorrhage in the right  collecting system which causes mild hydronephrosis and proximal  hydroureter. Probably related to anticoagulated state.  2. Likely renal cysts, too small characterize.  3. Indeterminate groundglass nodules in the lower lobes. Recommend  CT chest    Resident's Assessment and PLan     62 y.o. male with known hx of HTN and recently found a-fib started on coumadin. Patient had hematuria, INR <17, and was given vit K as well as FFP. He also had abdominal pain, CT of abd shown retroperitoneal hematoma.     1.  Supra theraputic INR, improved    - INR =>17 --> 1.5 --> 1.3  - HB=15.4 --> 12.5  - pt was on warfarin 10 mg for 2 weeks , not regularly checking INR.  - pt received Vitamin K (phytonadione 10 mg in d5 )  - will cx with FFP transfusion ( for total of 4 units ).  - UA= +ve for blood.  - BP stable  - denies abdominal  pain     2.  Hematuria:     - urology consulted : recommending cystoscopy and repeat CT in the future.  - PSA level  - Daily INR check.      3.  Retroperitoneal hemorrhage :     - CT abdomen:Right-sided retroperitoneal hemorrhage. Hemorrhage in the right collecting system which causes mild hydronephrosis and proximal hydroureter Likely renal cysts, too small characterize. Indeterminate groundglass nodules in the lower lobes.   - currently pt has no abdominal pain , no turner sign , no collen's sign , no gaurding ,no rigidity.   - daily neuro checks at lower extremities, watch for abdominal compartment syndrom  - check H/H this afternoon, then daily thereafter     4.  Atrial fibrillation :     - pt newly diagnosed with AF 2-3 weeks ago  - pt was asymptomatic , however AF was discovered prior to an elective hernial repair surgery in [place] hospital.  - Pt states he took lovenox for 2 days , and was started on coumadin 10 mg , he did not follow up with INR , he is unsure what was his last INR level  - currently afib rate controlled  - off cardizem drip     5.  Ho HTN     - home meds = coreg 25 mg , clonidine 0.3 mg BID , hydralazine 50 mg TID , IMDUR 60 mg , lisinopril 30 mg.  - resumed home meds     6.  Ho inguinal hernia     - Pt was scheduled for hernial repair surgery 2-3 weeks ago in [place] hospital , however he was found to have AF prior  to surgery so it was deferred until HR is under control.     7.  DVT/ GI prophylaxis :     Low sodium diet / high INR        Disposition: ok for possible transfer tomorrow    Narda Bonds M.D., PGY-1  Internal medicine resident    Attending physician: Dr. Buelah Manis     Department of Internal Medicine  Division of Pulmonary, Critical Care and Sleep Medicine  ICU Attending Addendum    Attending Physician Statement  Ved Martos was seen, examined and discussed with the multi-disciplinary ICU team during rounds. I have personally seen and examined the patient and the key elements of the  encounter were performed by me.      The medications & laboratory data was discussed and adjusted where necessary. The radiographic images were reviewed either as a group or with radiologist if felt dis-concordant with the exam or history. The above findings were corroborated, plans confirmed and changes made if needed.   Family is updated at the bedside as available. Key issues of the case were discussed among consultants.  Critical Care time is documented if appropriate.     Briefly, Sewell Pitner is a 62 y.o. male main issue is  1. RPH - HB rremains stable and INR corrected  2. Remain in controlled Afib  Urology notes reviewed and watchful waiting is planned for now  Resume BP meds  Advance diet for now  Check H& H to 12 hours      All other information is noted in the above team note.    Melvern Banker, DO, FCCP, [place], [place]  Associate Professor of Internal Medicine  Director, Pulmonary Health and [facility]  Internal Medicine Faculty Attending

## 2013-05-18 NOTE — H&P (Signed)
Department of Internal Medicine  General Internal Medicine  Manus Rudd, M.D. History and Physical      CHIEF COMPLAINT:  Hematuria "blood in my urine"    Reason for Admission:  INR of 17 and Retroperitoneal Hematoma    History Obtained From:  patient    HISTORY OF PRESENT ILLNESS:      The patient is a 62 y.o. male with significant past medical history of Atrial fibrillation diagnosed a bout 8 years ago- has been off coumadin therapy for many years ( he is not sure why it was stopped) who presents with complaitns of blood in urine yesterday which alarmed him. He was recently started on coumadin therapy about 2 weeks ago when he was found to be in afib. He was placed on 10 mg- was scheduled to have INR checked today for the first time according to patient. However he was having flank pain and when he saw blood in the urine, he came in for further eval.  He was transferred to MICU for further eval and treatment secondary to this finding of INR 17 with retroperit bleed.Vitals were stable.    Past Medical History:        Diagnosis Date   ??? Hypertension    ??? Atrial fibrillation    ??? HLD (hyperlipidemia)    ??? Inguinal hernia      Past Surgical History:    History reviewed. No pertinent past surgical history.      Medications Prior to Admission:    Prescriptions prior to admission:Vitamin D (CHOLECALCIFEROL) 1000 UNITS CAPS capsule, Take 2,000 Units by mouth daily.,   isosorbide mononitrate (IMDUR) 60 MG CR tablet, Take 60 mg by mouth daily.,   lisinopril (PRINIVIL;ZESTRIL) 10 MG tablet, Take 30 mg by mouth daily.,   carvedilol (COREG) 25 MG tablet, Take 25 mg by mouth 2 times daily (with meals).,   hydrALAZINE (APRESOLINE) 50 MG tablet, Take 50 mg by mouth 3 times daily.,   warfarin (COUMADIN) 5 MG tablet, Take  by mouth daily.,   traMADol (ULTRAM) 50 MG tablet, Take 50 mg by mouth 3 times daily.,   cloNIDine (CATAPRES) 0.3 MG tablet, Take 1 tablet by mouth 2 times daily.,     Allergies:  Review of patient's allergies  indicates no known allergies.    Social History:   TOBACCO:   reports that he has been smoking Cigarettes.  He has a 2.5 pack-year smoking history. He does not have any smokeless tobacco history on file.  ETOH:   reports that  drinks alcohol.  MARITAL STATUS:    OCCUPATION:      Family History:   History reviewed. No pertinent family history.    REVIEW OF SYSTEMS:    General ROS: negative  Hematological and Lymphatic ROS: negative  Endocrine ROS: negative  Respiratory ROS: no cough, shortness of breath, or wheezing  Cardiovascular ROS: no chest pain or dyspnea on exertion  Gastrointestinal ROS: no abdominal pain, change in bowel habits, or black or bloody stools- deneis abdominal or flank pain  Genito-Urinary ROS: no dysuria, trouble voiding, positive hematuria  Neurological ROS: no TIA or stroke symptoms  negative    Vitals:  BP 142/77   Pulse 98   Temp(Src) 98.7 ??F (37.1 ??C) (Oral)   Resp 24   Ht 6' 3.5" (1.918 m)   Wt 178 lb 12.7 oz (81.1 kg)   BMI 22.05 kg/m2   SpO2 97%    PHYSICAL EXAM:  General:  Awake, alert, oriented X  3.  Well developed, well nourished, well groomed.  No apparent distress.  HEENT:  Normocephalic, atraumatic.  Pupils equal, round, reactive to light.  No scleral icterus.  No conjunctival injection.  Normal lips, teeth, and gums.  No nasal discharge.  Neck:  Supple  Heart:  afib rate controlled, no murmurs, gallops, rubs, carotid upstroke normal, no carotid bruits  Lungs:  CTA bilaterally, bilat symmetrical expansion, no wheeze, rales, or rhonchi  Abdomen:  Bowel sounds present, soft, nontender, no masses, no organomegaly, no peritoneal signs- tender to palpation in flank area  Extremities:  No clubbing, cyanosis, or edema  Skin:  Warm and dry, no open lesions or rash  Neuro:  Cranial nerves 2-12 intact, no focal deficits  Breast: deferred  Rectal: deferred  Genitalia:  deferred      DATA:     Recent Labs      05/17/13   1000  05/17/13   1910  05/18/13   0400   WBC  14.2*   --   10.5   HGB   15.4  12.9  12.5   PLT  265   --   224     Recent Labs      05/18/13   0400   NA  142   K  3.4*   BUN  12   CREATININE  0.7     Recent Labs      05/17/13   1000  05/17/13   1910  05/18/13   1030   INR  >17.1*  1.5  1.3     No results found for this basename: AST, ALT, ALKPHOS, BILIDIR, BILITOT,  in the last 72 hours  No results found for this basename: BNP,  in the last 72 hours  No results found for this basename: CKTOTAL, CKMB, CKMBINDEX, TROPONINI,  in the last 72 hours    ASSESSMENT:      Principal Problem:    Coagulopathy (HCC)  Active Problems:    Hematuria    Retroperitoneal bleeding    Atrial fibrillation        PLAN:    Admitted to MICU secondary to markedly elevated INR of 17 with Ct abdomen showing retroperitoneal hematoma with mild hydronephrosis- pt has received IV vitamin k, FFP 4 units- currently INR reversed to 1.3-   Atrial fibrillation- rate controlled off cardizem drip at this point  Ok to transfer to tele bed pending transfer to [facility]  Hematuria resolved with reversal of anticoagulation  No hematocehzia  Urology consulted for hematuria- process of coagulopathy        Manus Rudd, MD  05/18/2013  4:25 PM

## 2013-05-18 NOTE — Care Coordination-Inpatient (Signed)
After meeting with epals decided that he will be go to the [facility] [place] when bed available- [facility]- I notified Andrey Campanile @ the [place] transfer center [phone] ex [phone] of patient's decision- she stated she will rescind his refusal to transfer - patient was notified he will be responsible for his co pay at the  [facility]

## 2013-05-18 NOTE — Care Coordination-Inpatient (Addendum)
SPOKE WITH PATIENT IN HIS ROOM.  HE STATES HE LIVES AT HOME ALONE.  HE REPORTS BEING INDEPENDENT, DRIVES, USES NO ASSISTIVE DEVICE.  [facility] [address], [facility].  PATIENT STATES HE HAS NO OTHER INSURANCE.  HE DOES NOT WANT TO GO TO [place], WANTS TO STAY HERE.  PATIENT WILL BE SELF PAY AND EPALS WAS NOTIFIED BY CASE MANAGER TO SEE PATIENT.  NO HISTORY OF HHC OR SAR IN THE PAST.  DISCUSSED SW ROLE.  WILL FOLLOW FOR DISCHARGE PLAN.  Pierce Cranemanda Olinger,LSW 05/18/13.    PATIENT HAS CHANGED HIS MIND AND STATES HE WANTS TRANSFERRED TO [place], CASE MANAGEMENT WAS NOTIFIED AND THEY SPOKE WITH THE [facility], NO BED AVAILABLE AT THIS TIME.  CASE MANAGER TO FOLLOW UP IN THE MORNING.  WILL CONITNUE TO FOLLOW FOR DISCHARGE PLANNING.  Pierce Cranemanda Olinger,LSW 05/18/13.

## 2013-05-18 NOTE — Other (Signed)
[facility] Quality Flow/Interdisciplinary Rounds Progress Note        Quality Flow Rounds held on May 18, 2013    Disciplines Attending:  Bedside Nurse, Nursing Unit Leadership, Respiratory Therapy and Pharmacist   INTENSIVIST: Dr. Gladys Damme was admitted on 05/17/2013 11:43 AM    Anticipated Discharge Date:  Expected Discharge Date: 05/20/13    Disposition:Unknown at this time.    Braden Score:  Braden Scale Score: 22    Readmission Risk           Readmission Risk:       2.5       Age 46 or Greater: 0    Admitted from SNF or Requires Paid or Family Care: 0    Currently has CHF,COPD,ARF,CRI,or is on dialysis: 0    Takes more than 5 Prescription Medications: 0    Takes Digoxin,Insulin,Anticoagulants,Narcotics or ASA/Plavix: 0    Hospital Admit in Past 12 Months: 0    On Disability: 0    Patient Considers own Health: 2.5          Discussed patient goal for the day, patient clinical progression, and barriers to discharge.  The following Goal(s) of the Day/Commitment(s) have been identified:   Continue present treatment. No family present for today's ICU rounds.         Dylan Beasley  May 18, 2013

## 2013-05-18 NOTE — Care Coordination-Inpatient (Signed)
Received call back from [facility] [place] transfer center- letter of refusal for transfer received sign by patient and  faxed back- @ [phone]

## 2013-05-19 DIAGNOSIS — K661 Hemoperitoneum: Secondary | ICD-10-CM

## 2013-05-19 DIAGNOSIS — K683 Retroperitoneal hematoma: Secondary | ICD-10-CM

## 2013-05-19 DIAGNOSIS — S98119A Complete traumatic amputation of unspecified great toe, initial encounter: Secondary | ICD-10-CM | POA: Diagnosis not present

## 2013-05-19 DIAGNOSIS — I5042 Chronic combined systolic (congestive) and diastolic (congestive) heart failure: Secondary | ICD-10-CM | POA: Diagnosis not present

## 2013-05-19 DIAGNOSIS — I96 Gangrene, not elsewhere classified: Secondary | ICD-10-CM | POA: Diagnosis not present

## 2013-05-19 DIAGNOSIS — Z794 Long term (current) use of insulin: Secondary | ICD-10-CM | POA: Diagnosis not present

## 2013-05-19 DIAGNOSIS — I509 Heart failure, unspecified: Secondary | ICD-10-CM | POA: Diagnosis not present

## 2013-05-19 DIAGNOSIS — Z992 Dependence on renal dialysis: Secondary | ICD-10-CM | POA: Diagnosis not present

## 2013-05-19 DIAGNOSIS — E1129 Type 2 diabetes mellitus with other diabetic kidney complication: Secondary | ICD-10-CM | POA: Diagnosis not present

## 2013-05-19 DIAGNOSIS — E1159 Type 2 diabetes mellitus with other circulatory complications: Secondary | ICD-10-CM | POA: Diagnosis not present

## 2013-05-19 DIAGNOSIS — N186 End stage renal disease: Secondary | ICD-10-CM | POA: Diagnosis not present

## 2013-05-19 LAB — CBC WITH AUTO DIFFERENTIAL
Basophils %: 0 % (ref 0–2)
Basophils Absolute: 0.04 E9/L (ref 0.00–0.20)
Eosinophils %: 1 % (ref 0–6)
Eosinophils Absolute: 0.08 E9/L (ref 0.05–0.50)
Hematocrit: 38.7 % (ref 37.0–54.0)
Hemoglobin: 12.8 g/dL (ref 12.5–16.5)
Lymphocytes %: 23 % (ref 20–42)
Lymphocytes Absolute: 2.51 E9/L (ref 1.50–4.00)
MCH: 32.1 pg (ref 26.0–35.0)
MCHC: 33.2 % (ref 32.0–34.5)
MCV: 96.7 fL (ref 80.0–99.9)
MPV: 8.5 fL (ref 7.0–12.0)
Monocytes %: 9 % (ref 2–12)
Monocytes Absolute: 0.97 E9/L — ABNORMAL HIGH (ref 0.10–0.95)
Neutrophils %: 67 % (ref 43–80)
Neutrophils Absolute: 7.14 E9/L (ref 1.80–7.30)
Platelets: 232 E9/L (ref 130–450)
RBC: 4 E12/L (ref 3.80–5.80)
RDW: 13 fL (ref 11.5–15.0)
WBC: 10.7 E9/L (ref 4.5–11.5)

## 2013-05-19 LAB — BASIC METABOLIC PANEL
BUN: 14 mg/dL (ref 8–23)
CO2: 25 mmol/L (ref 22–29)
Calcium: 9.9 mg/dL (ref 8.6–10.2)
Chloride: 104 mmol/L (ref 98–107)
Creatinine: 0.8 mg/dL (ref 0.7–1.2)
GFR African American: >60
GFR Non-African American: >60 mL/min/{1.73_m2} (ref >=60)
Glucose: 111 mg/dL — ABNORMAL HIGH (ref 74–109)
Potassium: 4 mmol/L (ref 3.5–5.0)
Sodium: 141 mmol/L (ref 132–146)

## 2013-05-19 LAB — PROTIME-INR
INR: 1.2
Protime: 12.6 s — ABNORMAL HIGH (ref 9.3–12.0)

## 2013-05-19 MED ADMIN — lisinopril (PRINIVIL;ZESTRIL) tablet 30 mg: ORAL | @ 12:00:00 | NDC 68001021000

## 2013-05-19 MED ADMIN — cloNIDine (CATAPRES) tablet 0.3 mg: ORAL | @ 01:00:00 | NDC 51079030017

## 2013-05-19 MED ADMIN — hydrALAZINE (APRESOLINE) tablet 50 mg: ORAL | @ 01:00:00 | NDC 51079007517

## 2013-05-19 MED ADMIN — sodium chloride flush 0.9 % injection 10 mL: INTRAVENOUS | @ 12:00:00

## 2013-05-19 MED ADMIN — cloNIDine (CATAPRES) tablet 0.3 mg: ORAL | @ 12:00:00 | NDC 51079030017

## 2013-05-19 MED ADMIN — hydrALAZINE (APRESOLINE) tablet 50 mg: ORAL | @ 12:00:00 | NDC 51079007517

## 2013-05-19 MED ADMIN — isosorbide mononitrate (IMDUR) CR tablet 60 mg: ORAL | @ 12:00:00 | NDC 00143226001

## 2013-05-19 MED ADMIN — sodium chloride flush 0.9 % injection 10 mL: INTRAVENOUS | @ 01:00:00

## 2013-05-19 MED ADMIN — warfarin (COUMADIN) tablet 5 mg: ORAL | @ 21:00:00 | NDC 00056017201

## 2013-05-19 MED ADMIN — carvedilol (COREG) tablet 25 mg: ORAL | @ 12:00:00 | NDC 68001015203

## 2013-05-19 MED ADMIN — hydrALAZINE (APRESOLINE) tablet 50 mg: ORAL | @ 19:00:00 | NDC 51079007517

## 2013-05-19 MED ADMIN — carvedilol (COREG) tablet 25 mg: ORAL | @ 21:00:00 | NDC 68001015203

## 2013-05-19 MED FILL — COUMADIN 5 MG PO TABS: 5 MG | ORAL | Qty: 1

## 2013-05-19 MED FILL — HYDRALAZINE HCL 25 MG PO TABS: 25 MG | ORAL | Qty: 2

## 2013-05-19 MED FILL — CARVEDILOL 25 MG PO TABS: 25 MG | ORAL | Qty: 1

## 2013-05-19 MED FILL — CLONIDINE HCL 0.1 MG PO TABS: 0.1 MG | ORAL | Qty: 1

## 2013-05-19 MED FILL — DILTIAZEM HCL 100 MG IV SOLR: 100 MG | INTRAVENOUS | Qty: 1

## 2013-05-19 MED FILL — ISOSORBIDE MONONITRATE ER 60 MG PO TB24: 60 MG | ORAL | Qty: 1

## 2013-05-19 MED FILL — LISINOPRIL 10 MG PO TABS: 10 MG | ORAL | Qty: 1

## 2013-05-19 NOTE — Consults (Signed)
Vascular Surgery Consultation Note      Reason for Consult:  Retroperitoneal hematoma.    HISTORY OF PRESENT ILLNESS:                The patient is a 62 y.o. male who is admitted to the hospital for treatment of hematuria and a retroperitoneal hematoma.  He was started on warfarin and bridged with Lovenox for atrial fibrillation and noticed hematuria and presented to the ED.  His INR was elevated.  A CT showed a hematoma of the right renal pelvis.  Vascular surgery is consulted for evaluation and treatment.    IMPRESSION:  Small retroperitoneal hematoma.    RECOMMENDATIONS:  Reverse anticoagulation, observation.  No role for vascular intervention.        Past Medical History   Diagnosis Date   ??? Hypertension    ??? Atrial fibrillation    ??? HLD (hyperlipidemia)    ??? Inguinal hernia         History reviewed. No pertinent past surgical history.    Current Medications:   ??? diltiazem (CARDIZEM) 100 mg in dextrose 5% 100 mL ([facility]) Stopped (05/18/13 1610)      sodium chloride flush, acetaminophen, magnesium hydroxide, ondansetron   ??? carvedilol  25 mg Oral BID WC   ??? cloNIDine  0.3 mg Oral BID   ??? hydrALAZINE  50 mg Oral TID   ??? isosorbide mononitrate  60 mg Oral Daily   ??? lisinopril  30 mg Oral Daily   ??? sodium chloride flush  10 mL Intravenous Q12H SCH        Allergies:  Review of patient's allergies indicates no known allergies.    History     Social History   ??? Marital Status: Single     Spouse Name: N/A     Number of Children: N/A   ??? Years of Education: N/A     Occupational History   ??? Not on file.     Social History Main Topics   ??? Smoking status: Current Every Day Smoker -- 0.50 packs/day for 5 years     Types: Cigarettes   ??? Smokeless tobacco: Not on file   ??? Alcohol Use: Yes      Comment: 2-3 beers daily   ??? Drug Use: No   ??? Sexual Activity: Not on file     Other Topics Concern   ??? Not on file     Social History Narrative        History reviewed. No pertinent family history.    REVIEW OF SYSTEMS:    The  chart was reviewed.    REVIEW OF SYSTEMS (Recent symptoms): Negative if blank [] , Positive if [x]      Constitutional    []  Fevers / chills  []  General weakness   []  Poor appetite    []  Weight gain or loss  Eyes    []  Blurred vision  []  Wear glasses / contacts   []  Visual disturbances   []  Blindness  Ears, Nose, Throat    []  Hearing loss  []  Ringing in ears   []  Nose bleeding    []  Voice hoarseness  []  Difficulty swallowing      Lungs    []  Cough  []  Chest pain   []  Wheezing    []  Difficult breathing  Heart    []  Chest pain during activity  []  Dizziness   [x]  Irregular heart beat    []  Fainting episode    []   Leg swelling   Stomach, Intestines    []  Intestinal bleeding  []  Heart burn   []  Abdominal pain    []  Stomach ulcers   []  Change in bowel habits      Kidney, Prostate    []  Frequent need to urinate  []  Incontinence   [x]  Blood in urine    []  Erectile dysfunction (men)      []  All negative   Hematologic, Lymphatic,   Skin, Musculoskeletal    []  Easy bruising  []  Swollen lymph nodes   []  Skin lesions, ulcers   []  Skin rash  []  Neck or back pain  []  Leg / foot pain   Neurologic    []  Speech problems  []  Memory loss   []  Headaches     []  Walking problems    []  Hand / arm / leg weakness   []  Numbness in arms or legs           PHYSICAL EXAM:    Filed Vitals:    05/19/13 1044   BP: 124/71   Pulse: 85   Temp: 97.8 ??F (36.6 ??C)   Resp: 22       CONSTITUTIONAL:  awake, alert, cooperative, no apparent distress, and appears stated age  EYES:  extra-ocular muscles intact and vision intact  ENT:  normocepalic, without obvious abnormality, atraumatic  NECK:  supple, symmetrical, trachea midline, no jugular venous distension, and MASSES:  no masses  LUNGS:  no increased work of breathing, good air exchange  CARDIOVASCULAR:  regular rate and rhythm  ABDOMEN:  soft, non-distended, non-tender, Aorta not palpated  SKIN:  no lesions  EXTREMITIES: none edema BLE          LABS:    Lab Results   Component Value Date    WBC 10.7  05/19/2013    HGB 12.8 05/19/2013    HCT 38.7 05/19/2013    PLT 232 05/19/2013    PROTIME 12.6* 05/19/2013    INR 1.2 05/19/2013    K 4.0 05/19/2013    BUN 14 05/19/2013    CREATININE 0.8 05/19/2013       RADIOLOGY:    CT reviewed.

## 2013-05-19 NOTE — Care Coordination-Inpatient (Addendum)
Updated Rich @ [facility] [phone] of patient status - no  beds today- will call on the weekend if bed available- Rich notified that patient was to transfer out of ICU to general floor bed

## 2013-05-19 NOTE — Progress Notes (Signed)
[facility]  Department of Internal Medicine   Internal Medicine Residency   MICU Progress Note    Patient:  Dylan Beasley 62 y.o. male  MRN: [id number]     Date of Service: 05/19/2013    Allergy: Review of patient's allergies indicates no known allergies.    Subjective       Patient is awake, alert. Denies chest pain, abdominal pain or shortness of breath. Denies hematuria. Hgb stable. INR 1.2.     Objective     VS: BP 109/58   Pulse 81   Temp(Src) 97.9 ??F (36.6 ??C) (Oral)   Resp 24   Ht 6' 3.5" (1.918 m)   Wt 178 lb 12.7 oz (81.1 kg)   BMI 22.05 kg/m2   SpO2 100%  I & O - 24hr:     Intake/Output Summary (Last 24 hours) at 05/19/13 1018  Last data filed at 05/19/13 0900   Gross per 24 hour   Intake    660 ml   Output      0 ml   Net    660 ml       Physical Exam:  ?? Vitals: BP 109/58   Pulse 81   Temp(Src) 97.9 ??F (36.6 ??C) (Oral)   Resp 24   Ht 6' 3.5" (1.918 m)   Wt 178 lb 12.7 oz (81.1 kg)   BMI 22.05 kg/m2   SpO2 100%    I & O - 24hr: I/O this shift:  In: 420 [P.O.:420]  ?? Out: -    ?? General Appearance: alert, appears stated age and cooperative  ?? HEENT:  Head: Normocephalic, no lesions, without obvious abnormality.  ?? Neck: no adenopathy, no carotid bruit, no JVD and supple, symmetrical, trachea midline  ?? Lung: clear to auscultation bilaterally  ?? Heart: irregular rhythm, regular rate, S1, S2 normal, no murmur, click, rub or gallop  ?? Abdomen: soft, non-tender; bowel sounds normal; no masses,  no organomegaly, + R inguinal hernia, nontender  ?? Extremities:  extremities normal, atraumatic, no cyanosis or edema  ?? Musculokeletal: No joint swelling, no muscle tenderness. ROM normal in all joints of extremities.   ?? Neurologic: Mental status: Alert, oriented, thought content appropriate  Lines     site day    Art line       TLC     PICC     Hemoaccess  peripheral 10/22     Oxygen:    ??? none  Mechanical Ventilation:  ??? N/A    ABG:    none    Medications     Infusions: (Fluid, Sedation,  Vasopressors)  IVF:   ??? none  Vasopressors  ??? none  Sedations  ??? none    Nutrition:   Low sodium diet    ATB:   Antibiotics  Days   none              Skin issues: none  Patient currently has     Labs     CBC:   Lab Results   Component Value Date    WBC 10.7 05/19/2013    RBC 4.00 05/19/2013    HGB 12.8 05/19/2013    HCT 38.7 05/19/2013    MCV 96.7 05/19/2013    MCH 32.1 05/19/2013    MCHC 33.2 05/19/2013    RDW 13.0 05/19/2013    PLT 232 05/19/2013    MPV 8.5 05/19/2013     CMP:    Lab Results   Component  Value Date    NA 141 05/19/2013    K 4.0 05/19/2013    CL 104 05/19/2013    CO2 25 05/19/2013    BUN 14 05/19/2013    CREATININE 0.8 05/19/2013    GFRAA >60 05/19/2013    LABGLOM >60 05/19/2013    GLUCOSE 111 05/19/2013    CALCIUM 9.9 05/19/2013     PT/INR:    Lab Results   Component Value Date    PROTIME 12.6 05/19/2013    INR 1.2 05/19/2013       Notable Cultures:    Urine culture - pending    Imaging Studies:    10/22 CT scan:   1. Right-sided retroperitoneal hemorrhage. Hemorrhage in the right  collecting system which causes mild hydronephrosis and proximal  hydroureter. Probably related to anticoagulated state.  2. Likely renal cysts, too small characterize.  3. Indeterminate groundglass nodules in the lower lobes. Recommend  CT chest    Resident's Assessment and PLan     62 y.o. male with known hx of HTN and recently found a-fib started on coumadin. Patient had hematuria, INR <17, and was given vit K as well as FFP. He also had abdominal pain, CT of abd shown retroperitoneal hematoma.     1.  Supra theraputic INR, resolved    - INR =>17 --> 1.5 --> 1.3 --> 1.2  - HB=15.4 --> 12.5 -> 12.8  - received a total of 4 U FFP  - UA= +ve for blood.  - BP stable  - denies abdominal pain, hematuria     2.  Hematuria, resolved    - urology consulted : recommending cystoscopy and repeat CT in the future.  - PSA level  - Daily INR check.      3.  Retroperitoneal hemorrhage :     - currently pt has no abdominal pain , no  turner sign , no collen's sign , no gaurding ,no rigidity.   - daily neuro checks at lower extremities, watch for abdominal compartment syndrom  - no weakness or tingling of LE     4.  Atrial fibrillation :     - currently afib rate controlled  - off cardizem drip  - decision to restart anticoagulation per PCP     5.  Ho HTN     - home meds = coreg 25 mg , clonidine 0.3 mg BID , hydralazine 50 mg TID , IMDUR 60 mg , lisinopril 30 mg.  - resumed home meds     6.  Ho inguinal hernia     - Pt was scheduled for hernial repair surgery 2-3 weeks ago in [place] [facility] , however he was found to have AF prior to surgery so it was deferred until HR is under control.     7.  DVT/ GI prophylaxis :     Low sodium diet / high INR        Disposition: ok for transfer to general floor    Lani Harriet Butte M.D., PGY-1  Internal medicine resident    Attending physician: Dr. Buelah Manis       Department of Internal Medicine  Division of Pulmonary, Critical Care and Sleep Medicine  ICU Attending Addendum    Attending Physician Statement  Dylan Beasley was seen, examined and discussed with the multi-disciplinary ICU team during rounds. I have personally seen and examined the patient and the key elements of the encounter were performed by me.      The medications &  laboratory data was discussed and adjusted where necessary. The radiographic images were reviewed either as a group or with radiologist if felt dis-concordant with the exam or history. The above findings were corroborated, plans confirmed and changes made if needed.   Family is updated at the bedside as available. Key issues of the case were discussed among consultants.  Critical Care time is documented if appropriate.     Briefly, Dylan Beasley is a 62 y.o. male main issue is  1. RPH stable Hb and HCT. INR reversed. Anticoag recovery per PCP. Transfer out of ICU. Team will sign off      All other information is noted in the above team note.    Melvern Banker, DO, FCCP, [place],  [place]  Associate Professor of Internal Medicine  Director, Pulmonary Health and [facility]  Internal Medicine Faculty Attending

## 2013-05-19 NOTE — Progress Notes (Signed)
05/19/2013 2:14 PM  Service: Urology  Group: [facility] (Lilana Blasko/Ricchiuti)    Dylan Beasley  [id number]    Chief urologic compliant: retroperoenal hematoma  HPI:  Patient is doing much better  He is voiding well  He has not seen any more gross hematuria    Review of Systems:  Respiratory: negative for cough and hemoptysis  Cardiovascular: negative for chest pain and dyspnea  Gastrointestinal: negative for abdominal pain, diarrhea, nausea and vomiting  Derm: negative for rash and skin lesion(s)  Neurological: negative for seizures and tremors  Endocrine: negative for diabetic symptoms including polydipsia and polyuria  GU: As above in the HPI, otherwise negative  All other reviews are negative     Allergies: Review of patient's allergies indicates no known allergies.    Objective:   Filed Vitals:    05/19/13 1044   BP: 124/71   Pulse: 85   Temp: 97.8 ??F (36.6 ??C)   Resp: 22       Neuro: A/A/O x3  Respiratory: non labored breathing  ABD: soft non-tender, non-distended  GU: foley no  Ext: no clubbing, cyanosis, edema    Labs:   Recent Labs      05/17/13   1000   05/18/13   0400  05/18/13   1559  05/19/13   0430   WBC  14.2*   --   10.5   --   10.7   RBC  4.93   --   3.96   --   4.00   HGB  15.4   < >  12.5  12.8  12.8   HCT  47.6   < >  37.9  38.0  38.7   MCV  96.6   --   95.9   --   96.7   MCH  31.2   --   31.6   --   32.1   MCHC  32.3   --   33.0   --   33.2   RDW  13.1   --   13.1   --   13.0   PLT  265   --   224   --   232   MPV  9.0   --   8.2   --   8.5    < > = values in this interval not displayed.       Recent Labs      05/18/13   0400  05/19/13   0430   CREATININE  0.7  0.8       Assessment: Dylan Beasley 62 y.o. male     Gross hematuria  Retroperitoneal hematoma ? etiology    Plan:    Follow up as an outpatient  Will need a cysto  Ok to DC from a GU standpoint  Call with questions    Redge Gainer Genelle Economou, DO   [facility]

## 2013-05-20 LAB — CULTURE, BLOOD (ROUTINE X 2): Culture: NO GROWTH

## 2013-05-20 LAB — CBC WITH AUTO DIFFERENTIAL
Basophils %: 1 % (ref 0–2)
Basophils Absolute: 0.06 E9/L (ref 0.00–0.20)
Eosinophils %: 1 % (ref 0–6)
Eosinophils Absolute: 0.1 E9/L (ref 0.05–0.50)
Hematocrit: 39.3 % (ref 37.0–54.0)
Hemoglobin: 13 g/dL (ref 12.5–16.5)
Lymphocytes %: 29 % (ref 20–42)
Lymphocytes Absolute: 3.09 E9/L (ref 1.50–4.00)
MCH: 32.1 pg (ref 26.0–35.0)
MCHC: 33 % (ref 32.0–34.5)
MCV: 97.1 fL (ref 80.0–99.9)
MPV: 8.7 fL (ref 7.0–12.0)
Monocytes %: 7 % (ref 2–12)
Monocytes Absolute: 0.75 E9/L (ref 0.10–0.95)
Neutrophils %: 63 % (ref 43–80)
Neutrophils Absolute: 6.69 E9/L (ref 1.80–7.30)
Platelets: 255 E9/L (ref 130–450)
RBC: 4.05 E12/L (ref 3.80–5.80)
RDW: 13.2 fL (ref 11.5–15.0)
WBC: 10.7 E9/L (ref 4.5–11.5)

## 2013-05-20 LAB — PROTIME-INR
INR: 1.2
Protime: 12.5 s — ABNORMAL HIGH (ref 9.3–12.0)

## 2013-05-20 LAB — CULTURE, URINE: Urine Culture, Routine: <10000

## 2013-05-20 MED ADMIN — cloNIDine (CATAPRES) tablet 0.3 mg: ORAL | @ 14:00:00 | NDC 51079030017

## 2013-05-20 MED ADMIN — carvedilol (COREG) tablet 25 mg: ORAL | @ 14:00:00 | NDC 68001015203

## 2013-05-20 MED ADMIN — warfarin (COUMADIN) tablet 5 mg: ORAL | @ 22:00:00 | NDC 00056017201

## 2013-05-20 MED ADMIN — lisinopril (PRINIVIL;ZESTRIL) tablet 30 mg: ORAL | @ 14:00:00 | NDC 68001021000

## 2013-05-20 MED ADMIN — hydrALAZINE (APRESOLINE) tablet 50 mg: ORAL | @ 18:00:00 | NDC 51079007517

## 2013-05-20 MED ADMIN — hydrALAZINE (APRESOLINE) tablet 50 mg: ORAL | @ 01:00:00 | NDC 51079007517

## 2013-05-20 MED ADMIN — sodium chloride flush 0.9 % injection 10 mL: INTRAVENOUS | @ 14:00:00

## 2013-05-20 MED ADMIN — cloNIDine (CATAPRES) tablet 0.3 mg: ORAL | @ 01:00:00 | NDC 51079030017

## 2013-05-20 MED ADMIN — isosorbide mononitrate (IMDUR) CR tablet 60 mg: ORAL | @ 14:00:00 | NDC 00143226001

## 2013-05-20 MED ADMIN — carvedilol (COREG) tablet 25 mg: ORAL | @ 21:00:00 | NDC 68001015203

## 2013-05-20 MED ADMIN — hydrALAZINE (APRESOLINE) tablet 50 mg: ORAL | @ 14:00:00 | NDC 51079007517

## 2013-05-20 MED ADMIN — sodium chloride flush 0.9 % injection 10 mL: INTRAVENOUS | @ 01:00:00

## 2013-05-20 MED FILL — ISOSORBIDE MONONITRATE ER 60 MG PO TB24: 60 MG | ORAL | Qty: 1

## 2013-05-20 MED FILL — HYDRALAZINE HCL 25 MG PO TABS: 25 MG | ORAL | Qty: 2

## 2013-05-20 MED FILL — CARVEDILOL 25 MG PO TABS: 25 MG | ORAL | Qty: 1

## 2013-05-20 MED FILL — LISINOPRIL 10 MG PO TABS: 10 MG | ORAL | Qty: 1

## 2013-05-20 MED FILL — COUMADIN 5 MG PO TABS: 5 MG | ORAL | Qty: 1

## 2013-05-20 MED FILL — CLONIDINE HCL 0.1 MG PO TABS: 0.1 MG | ORAL | Qty: 1

## 2013-05-20 MED FILL — NORMAL SALINE FLUSH 0.9 % IV SOLN: 0.9 % | INTRAVENOUS | Qty: 10

## 2013-05-20 MED FILL — DILTIAZEM HCL 100 MG IV SOLR: 100 MG | INTRAVENOUS | Qty: 1

## 2013-05-20 NOTE — Progress Notes (Signed)
Subjective:    The patient is awake and alert.  No problems overnight.  Denies chest pain, angina, and dyspnea.  Denies abdominal pain.  Tolerating diet.  No nausea or vomiting. No pain or discomfort in flank    Objective:    BP 138/88   Pulse 86   Temp(Src) 98.1 ??F (36.7 ??C) (Oral)   Resp 22   Ht 6' 3.5" (1.918 m)   Wt 178 lb 12.7 oz (81.1 kg)   BMI 22.05 kg/m2   SpO2 100%    In: 720 [P.O.:720]  Out: -     HEENT: NCAT,  PERRLA, No JVD  Heart:  RRR, no murmurs, gallops, or rubs.  Lungs:  CTA bilaterally, no wheeze, rales or rhonchi  Abd: bowel sounds present, nontender, nondistended, no masses  Extrem:  No clubbing, cyanosis, or edema     Recent Labs      05/18/13   0400  05/18/13   1559  05/19/13   0430  05/20/13   0457   WBC  10.5   --   10.7  10.7   HGB  12.5  12.8  12.8  13.0   HCT  37.9  38.0  38.7  39.3   PLT  224   --   232  255       Recent Labs      05/18/13   0400  05/19/13   0430   NA  142  141   K  3.4*  4.0   CL  103  104   CO2  27  25   BUN  12  14   CREATININE  0.7  0.8   CALCIUM  9.4  9.9       Assessment:    Patient Active Problem List   Diagnosis   ??? Hematuria   ??? Retroperitoneal bleeding   ??? Atrial fibrillation   ??? Coagulopathy (HCC)   ??? Retroperitoneal hematoma       Plan:    Coumadin resumed at 5 mg daily yesterday secondary to known history of afib/  Daily inr checks until theraputic  Discharge plan once inr close to 2.    DVT and GERD prophylaxis    All consultants notes reviewed    Manus Rudd, MD  4:01 PM  05/20/2013

## 2013-05-20 NOTE — Discharge Instructions (Signed)
Call upon discharge to schedule a follow-up visit with Dr. Loraine Leriche Memo ([facility]) at [phone] due to the blood in your urine.  It is necessary to look into this further.

## 2013-05-21 LAB — HEMOGLOBIN AND HEMATOCRIT
Hematocrit: 40.1 % (ref 37.0–54.0)
Hemoglobin: 13.1 g/dL (ref 12.5–16.5)

## 2013-05-21 LAB — PROTIME-INR
INR: 1.2
Protime: 13 s — ABNORMAL HIGH (ref 9.3–12.0)

## 2013-05-21 MED ORDER — WARFARIN SODIUM 5 MG PO TABS
5 MG | ORAL_TABLET | ORAL | Status: DC
Start: 2013-05-21 — End: 2017-10-06

## 2013-05-21 MED ADMIN — hydrALAZINE (APRESOLINE) tablet 50 mg: ORAL | @ 01:00:00 | NDC 51079007517

## 2013-05-21 MED ADMIN — hydrALAZINE (APRESOLINE) tablet 50 mg: ORAL | @ 13:00:00 | NDC 51079007517

## 2013-05-21 MED ADMIN — lisinopril (PRINIVIL;ZESTRIL) tablet 30 mg: ORAL | @ 13:00:00 | NDC 51079098317

## 2013-05-21 MED ADMIN — carvedilol (COREG) tablet 25 mg: ORAL | @ 13:00:00 | NDC 68001015203

## 2013-05-21 MED ADMIN — cloNIDine (CATAPRES) tablet 0.3 mg: ORAL | @ 13:00:00 | NDC 51079030017

## 2013-05-21 MED ADMIN — cloNIDine (CATAPRES) tablet 0.3 mg: ORAL | @ 01:00:00 | NDC 51079030017

## 2013-05-21 MED ADMIN — sodium chloride flush 0.9 % injection 10 mL: INTRAVENOUS | @ 13:00:00

## 2013-05-21 MED ADMIN — isosorbide mononitrate (IMDUR) CR tablet 60 mg: ORAL | @ 13:00:00 | NDC 00143226001

## 2013-05-21 MED FILL — NORMAL SALINE FLUSH 0.9 % IV SOLN: 0.9 % | INTRAVENOUS | Qty: 10

## 2013-05-21 MED FILL — DILTIAZEM HCL 100 MG IV SOLR: 100 MG | INTRAVENOUS | Qty: 1

## 2013-05-22 DIAGNOSIS — E1159 Type 2 diabetes mellitus with other circulatory complications: Secondary | ICD-10-CM | POA: Diagnosis not present

## 2013-05-22 DIAGNOSIS — I5042 Chronic combined systolic (congestive) and diastolic (congestive) heart failure: Secondary | ICD-10-CM | POA: Diagnosis not present

## 2013-05-22 DIAGNOSIS — I96 Gangrene, not elsewhere classified: Secondary | ICD-10-CM | POA: Diagnosis not present

## 2013-05-22 DIAGNOSIS — N186 End stage renal disease: Secondary | ICD-10-CM | POA: Diagnosis not present

## 2013-05-22 DIAGNOSIS — E1129 Type 2 diabetes mellitus with other diabetic kidney complication: Secondary | ICD-10-CM | POA: Diagnosis not present

## 2013-05-22 DIAGNOSIS — S98119A Complete traumatic amputation of unspecified great toe, initial encounter: Secondary | ICD-10-CM | POA: Diagnosis not present

## 2013-05-23 LAB — CANNABINOID, URINE, CONFIRMATION

## 2013-05-23 LAB — BENZODIAZEPINES CONFIRMATION URINE REFLEX

## 2013-05-24 DIAGNOSIS — I96 Gangrene, not elsewhere classified: Secondary | ICD-10-CM | POA: Diagnosis not present

## 2013-05-24 DIAGNOSIS — E1159 Type 2 diabetes mellitus with other circulatory complications: Secondary | ICD-10-CM | POA: Diagnosis not present

## 2013-05-24 DIAGNOSIS — E1129 Type 2 diabetes mellitus with other diabetic kidney complication: Secondary | ICD-10-CM | POA: Diagnosis not present

## 2013-05-24 DIAGNOSIS — S98119A Complete traumatic amputation of unspecified great toe, initial encounter: Secondary | ICD-10-CM | POA: Diagnosis not present

## 2013-05-24 DIAGNOSIS — N186 End stage renal disease: Secondary | ICD-10-CM | POA: Diagnosis not present

## 2013-05-24 DIAGNOSIS — I5042 Chronic combined systolic (congestive) and diastolic (congestive) heart failure: Secondary | ICD-10-CM | POA: Diagnosis not present

## 2013-05-24 LAB — OPIATE, QUANTITATIVE, URINE
6AM Urine: <20 ng/mL
Codeine, Urine: <20 ng/mL
Hydrocodone, Urine: 840 ng/mL
Hydromorphone, Urine: 59 ng/mL
Morphine Urine: 95 ng/mL
Norhydrocodone, Urine: 1425 ng/mL
Noroxycodone, Urine: 86 ng/mL
Noroxymorphone, Urine: <20 ng/mL
Opiate, Urine Interpretation: POSITIVE
Oxycodone, Urine Confirmation: 24 ng/mL
Oxymorphone, Urine: <20 ng/mL

## 2013-05-26 DIAGNOSIS — S98119A Complete traumatic amputation of unspecified great toe, initial encounter: Secondary | ICD-10-CM | POA: Diagnosis not present

## 2013-05-26 DIAGNOSIS — I5042 Chronic combined systolic (congestive) and diastolic (congestive) heart failure: Secondary | ICD-10-CM | POA: Diagnosis not present

## 2013-05-26 DIAGNOSIS — E1129 Type 2 diabetes mellitus with other diabetic kidney complication: Secondary | ICD-10-CM | POA: Diagnosis not present

## 2013-05-26 DIAGNOSIS — E1159 Type 2 diabetes mellitus with other circulatory complications: Secondary | ICD-10-CM | POA: Diagnosis not present

## 2013-05-26 DIAGNOSIS — I96 Gangrene, not elsewhere classified: Secondary | ICD-10-CM | POA: Diagnosis not present

## 2013-05-26 DIAGNOSIS — N186 End stage renal disease: Secondary | ICD-10-CM | POA: Diagnosis not present

## 2013-05-27 DIAGNOSIS — D509 Iron deficiency anemia, unspecified: Secondary | ICD-10-CM | POA: Diagnosis not present

## 2013-05-27 DIAGNOSIS — Z992 Dependence on renal dialysis: Secondary | ICD-10-CM | POA: Diagnosis not present

## 2013-05-27 DIAGNOSIS — I96 Gangrene, not elsewhere classified: Secondary | ICD-10-CM | POA: Diagnosis not present

## 2013-05-27 DIAGNOSIS — D631 Anemia in chronic kidney disease: Secondary | ICD-10-CM | POA: Diagnosis not present

## 2013-05-27 DIAGNOSIS — N186 End stage renal disease: Secondary | ICD-10-CM | POA: Diagnosis not present

## 2013-05-27 DIAGNOSIS — N2581 Secondary hyperparathyroidism of renal origin: Secondary | ICD-10-CM | POA: Diagnosis not present

## 2013-05-29 DIAGNOSIS — S98119A Complete traumatic amputation of unspecified great toe, initial encounter: Secondary | ICD-10-CM

## 2013-05-29 DIAGNOSIS — E1165 Type 2 diabetes mellitus with hyperglycemia: Secondary | ICD-10-CM

## 2013-05-29 DIAGNOSIS — E1129 Type 2 diabetes mellitus with other diabetic kidney complication: Secondary | ICD-10-CM

## 2013-05-29 DIAGNOSIS — E1159 Type 2 diabetes mellitus with other circulatory complications: Secondary | ICD-10-CM

## 2013-05-29 DIAGNOSIS — I96 Gangrene, not elsewhere classified: Secondary | ICD-10-CM

## 2013-05-29 DIAGNOSIS — I5042 Chronic combined systolic (congestive) and diastolic (congestive) heart failure: Secondary | ICD-10-CM | POA: Diagnosis not present

## 2013-05-29 DIAGNOSIS — N186 End stage renal disease: Secondary | ICD-10-CM | POA: Diagnosis not present

## 2013-05-31 ENCOUNTER — Encounter: Payer: Self-pay | Admitting: Internal Medicine

## 2013-05-31 ENCOUNTER — Ambulatory Visit (INDEPENDENT_AMBULATORY_CARE_PROVIDER_SITE_OTHER): Payer: Medicare Other | Admitting: Internal Medicine

## 2013-05-31 VITALS — BP 160/90 | HR 93 | Temp 100.0°F | Wt 200.0 lb

## 2013-05-31 DIAGNOSIS — S98119A Complete traumatic amputation of unspecified great toe, initial encounter: Secondary | ICD-10-CM | POA: Diagnosis not present

## 2013-05-31 DIAGNOSIS — M79609 Pain in unspecified limb: Secondary | ICD-10-CM

## 2013-05-31 DIAGNOSIS — R443 Hallucinations, unspecified: Secondary | ICD-10-CM

## 2013-05-31 DIAGNOSIS — E1129 Type 2 diabetes mellitus with other diabetic kidney complication: Secondary | ICD-10-CM | POA: Diagnosis not present

## 2013-05-31 DIAGNOSIS — E1159 Type 2 diabetes mellitus with other circulatory complications: Secondary | ICD-10-CM | POA: Diagnosis not present

## 2013-05-31 DIAGNOSIS — M79672 Pain in left foot: Secondary | ICD-10-CM

## 2013-05-31 DIAGNOSIS — N186 End stage renal disease: Secondary | ICD-10-CM | POA: Diagnosis not present

## 2013-05-31 DIAGNOSIS — I10 Essential (primary) hypertension: Secondary | ICD-10-CM

## 2013-05-31 DIAGNOSIS — I5042 Chronic combined systolic (congestive) and diastolic (congestive) heart failure: Secondary | ICD-10-CM | POA: Diagnosis not present

## 2013-05-31 DIAGNOSIS — I96 Gangrene, not elsewhere classified: Secondary | ICD-10-CM | POA: Diagnosis not present

## 2013-05-31 MED ORDER — HALOPERIDOL 5 MG PO TABS: 5.0000 mg | ORAL_TABLET | Freq: Every evening | ORAL | Status: DC | PRN

## 2013-05-31 MED ORDER — HYDROCODONE-ACETAMINOPHEN 7.5-325 MG PO TABS: 1.0000 | ORAL_TABLET | Freq: Four times a day (QID) | ORAL | Status: DC | PRN

## 2013-05-31 NOTE — Patient Instructions (Signed)
Please take all new medication as prescribed - the pain medication, and the haldol for bedtime Please continue all other medications as before, and refills have been done if requested. Please have the pharmacy call with any other refills you may need. Please keep your appointments with your specialists as you have planned  Please remember to sign up for My Chart if you have not done so, as this will be important to you in the future with finding out test results, communicating by private email, and scheduling acute appointments online when needed.  Please return in 6 months, or sooner if needed

## 2013-05-31 NOTE — Progress Notes (Addendum)
Subjective:    Patient ID: Oscar Castillo, male    DOB: 15-Feb-1951, 62 y.o.   MRN: [id number]  HPI  Here to f/u recent hospn with left toe amputation, c/o pain not currently well controlled, had confusion with oxycodone in the past.  Has seen ortho/dr duda last Friday with f/u again planned this Friday for slow healing exam.  Pt denies chest pain, increased sob or doe, wheezing, orthopnea, PND, increased LE swelling, palpitations, dizziness or syncope. Pt denies new neurological symptoms such as new headache, or facial or extremity weakness or numbness Wife c/o Nights and days were mixed up, has been daytime sleepiong but then up during the day with home therapy recently so not so much sleeping, but still up last night most of the night talking with visual hallucinations that are distressing to pt. Past Medical History  Diagnosis Date  . ALLERGIC RHINITIS 05/05/2007  . BACK PAIN, LUMBAR, CHRONIC 08/06/2009  . BENIGN PROSTATIC HYPERTROPHY 08/01/2010  . CEREBROVASCULAR ACCIDENT, HX OF 08/06/2009  . CHOLELITHIASIS 08/01/2010  . CONGESTIVE HEART FAILURE 03/18/2009  . DEPRESSION 03/18/2009  . DIABETES MELLITUS, TYPE II 03/25/2007  . ERECTILE DYSFUNCTION 03/25/2007  . GERD 03/25/2007  . HEPATITIS C, HX OF 03/25/2007  . HYPERTENSION 03/25/2007  . Morbid obesity 03/25/2007  . NEPHROLITHIASIS, HX OF 03/18/2009  . ESRD on hemodialysis 09/08/2012    ESRD due to DM/HTN. Started dialysis in November 2013. Has LUA AVF placed Jun 14 2012 which is maturing as of Feb 2014.   [name] Renal insufficiency    Past Surgical History  Procedure Laterality Date  . Nephrectomy      partial RR  . Av fistula placement  06/14/2012    Procedure: ARTERIOVENOUS (AV) FISTULA CREATION;  Surgeon: Chuck Hint, MD;  Location: [facility] OR;  Service: Vascular;  Laterality: Left;  Left basilic vein transposition with fistula.  . Tibia im nail insertion Left 09/09/2012    Procedure: INTRAMEDULLARY (IM) NAIL TIBIAL;  Surgeon: Eulas Post, MD;   Location: [facility] OR;  Service: Orthopedics;  Laterality: Left;  left tibial nail and open reduction internal fixation left fibula fracture  . Orif fibula fracture Left 09/09/2012    Procedure: OPEN REDUCTION INTERNAL FIXATION (ORIF) FIBULA FRACTURE;  Surgeon: Eulas Post, MD;  Location: [facility] OR;  Service: Orthopedics;  Laterality: Left;  . Colonoscopy N/A 10/28/2012    Procedure: COLONOSCOPY;  Surgeon: Petra Kuba, MD;  Location: [facility] ENDOSCOPY;  Service: Endoscopy;  Laterality: N/A;  . Esophagogastroduodenoscopy N/A 11/02/2012    Procedure: ESOPHAGOGASTRODUODENOSCOPY (EGD);  Surgeon: Florencia Reasons, MD;  Location: [facility] ENDOSCOPY;  Service: Endoscopy;  Laterality: N/A;  . Colonoscopy N/A 11/02/2012    Procedure: COLONOSCOPY;  Surgeon: Florencia Reasons, MD;  Location: [facility] ENDOSCOPY;  Service: Endoscopy;  Laterality: N/A;  . Colonoscopy N/A 11/03/2012    Procedure: COLONOSCOPY;  Surgeon: Florencia Reasons, MD;  Location: [facility] ENDOSCOPY;  Service: Endoscopy;  Laterality: N/A;  . Givens capsule study N/A 11/04/2012    Procedure: GIVENS CAPSULE STUDY;  Surgeon: Florencia Reasons, MD;  Location: [facility] ENDOSCOPY;  Service: Endoscopy;  Laterality: N/A;  . Enteroscopy N/A 11/08/2012    Procedure: ENTEROSCOPY;  Surgeon: Graylin Shiver, MD;  Location: [facility] ENDOSCOPY;  Service: Endoscopy;  Laterality: N/A;  . Amputation Left 05/12/2013    Procedure: AMPUTATION RAY;  Surgeon: Nadara Mustard, MD;  Location: [facility] OR;  Service: Orthopedics;  Laterality: Left;  Left Foot 1st Ray Amputation    reports that he  has never smoked. He does not have any smokeless tobacco history on file. He reports that he does not drink alcohol or use illicit drugs. family history includes Coronary artery disease in his other; Diabetes in his other. No Known Allergies Current Outpatient Prescriptions on File Prior to Visit  Medication Sig Dispense Refill  . amLODipine (NORVASC) 10 MG tablet Take 10 mg by mouth daily.      . Brinzolamide-Brimonidine  (SIMBRINZA) 1-0.2 % SUSP Place 1 drop into the right eye 2 (two) times daily.      . calcium acetate (PHOSLO) 667 MG capsule Take 2 capsules (1,334 mg total) by mouth 3 (three) times daily with meals.  180 capsule  0  . darbepoetin (ARANESP) 60 MCG/0.3ML SOLN injection Inject 0.3 mLs (60 mcg total) into the vein every Tuesday with hemodialysis.  4.2 mL    . dextrose 5 % SOLN 50 mL with ceFAZolin 1 G SOLR 2 g ivpb Inject 2 g into the vein Every Tuesday,Thursday,and Saturday with dialysis.  50 mL  0  . docusate sodium (COLACE) 100 MG capsule Take 1 capsule (100 mg total) by mouth 2 (two) times daily.  10 capsule  0  . Insulin Glargine (LANTUS SOLOSTAR Starr) Inject 5 Units into the skin every evening.      . labetalol (NORMODYNE) 300 MG tablet Take 1 tablet (300 mg total) by mouth 3 (three) times daily.  90 tablet  6  . pantoprazole (PROTONIX) 40 MG tablet Take 40 mg by mouth daily as needed (indigestion).      . Travoprost, BAK Free, (TRAVATAN) 0.004 % SOLN ophthalmic solution Place 1 drop into both eyes at bedtime.  1 Bottle  0  . traZODone (DESYREL) 50 MG tablet Take 50 mg by mouth at bedtime as needed for sleep.      . traMADol (ULTRAM) 50 MG tablet Take 50 mg by mouth every 8 (eight) hours as needed for pain.       No current facility-administered medications on file prior to visit.   Review of Systems  Constitutional: Negative for unexpected weight change, or unusual diaphoresis  HENT: Negative for tinnitus.   Eyes: Negative for photophobia and visual disturbance.  Respiratory: Negative for choking and stridor.   Gastrointestinal: Negative for vomiting and blood in stool.  Genitourinary: Negative for hematuria and decreased urine volume.  Musculoskeletal: Negative for acute joint swelling Skin: Negative for color change and wound.  Neurological: Negative for tremors and numbness other than noted  Psychiatric/Behavioral: Negative for decreased concentration or  hyperactivity.        Objective:   Physical Exam BP 160/90  Pulse 93  Temp(Src) 100 F (37.8 C) (Oral)  Wt 200 lb (90.719 kg)  SpO2 99% VS noted,  Constitutional: Pt appears well-developed and well-nourished.  HENT: Head: NCAT.  Right Ear: External ear normal.  Left Ear: External ear normal.  Eyes: Conjunctivae and EOM are normal. Pupils are equal, round, and reactive to light.  Neck: Normal range of motion. Neck supple.  Cardiovascular: Normal rate and regular rhythm.   Pulmonary/Chest: Effort normal and breath sounds normal.  Abd:  Soft, NT, non-distended, + BS Neurological: Pt is alert. Not confused  Skin: Skin is warm. No erythema. s/p amputation as above Psychiatric: Pt behavior is normal. Thought content normal. not depressed affect    Assessment & Plan:  Quality Measures addressed:  Diabetes LDL < 100: pt declines further medication

## 2013-05-31 NOTE — Progress Notes (Signed)
Pre-visit discussion using our clinic review tool. No additional management support is needed unless otherwise documented below in the visit note.  

## 2013-06-01 ENCOUNTER — Other Ambulatory Visit: Payer: Self-pay

## 2013-06-01 DIAGNOSIS — M79672 Pain in left foot: Secondary | ICD-10-CM | POA: Insufficient documentation

## 2013-06-01 DIAGNOSIS — R443 Hallucinations, unspecified: Secondary | ICD-10-CM | POA: Insufficient documentation

## 2013-06-01 NOTE — Assessment & Plan Note (Signed)
Will hold on further eval at this time, but for haldol 5 qhs prn,  to f/u any worsening symptoms or concerns

## 2013-06-01 NOTE — Assessment & Plan Note (Signed)
Ok for temp incr in hydrocodone to 7.5.325 prn,  to f/u any worsening symptoms or concerns

## 2013-06-01 NOTE — Assessment & Plan Note (Signed)
Mild elev today liekly situational, stable overall by history and exam, recent data reviewed with pt, and pt to continue medical treatment as before,  to f/u any worsening symptoms or concerns BP Readings from Last 3 Encounters:  05/31/13 160/90  05/15/13 142/88  05/15/13 142/88

## 2013-06-02 DIAGNOSIS — I5042 Chronic combined systolic (congestive) and diastolic (congestive) heart failure: Secondary | ICD-10-CM | POA: Diagnosis not present

## 2013-06-02 DIAGNOSIS — I96 Gangrene, not elsewhere classified: Secondary | ICD-10-CM | POA: Diagnosis not present

## 2013-06-02 DIAGNOSIS — S98119A Complete traumatic amputation of unspecified great toe, initial encounter: Secondary | ICD-10-CM | POA: Diagnosis not present

## 2013-06-02 DIAGNOSIS — E1129 Type 2 diabetes mellitus with other diabetic kidney complication: Secondary | ICD-10-CM | POA: Diagnosis not present

## 2013-06-02 DIAGNOSIS — E1159 Type 2 diabetes mellitus with other circulatory complications: Secondary | ICD-10-CM | POA: Diagnosis not present

## 2013-06-02 DIAGNOSIS — N186 End stage renal disease: Secondary | ICD-10-CM | POA: Diagnosis not present

## 2013-06-05 DIAGNOSIS — N186 End stage renal disease: Secondary | ICD-10-CM | POA: Diagnosis not present

## 2013-06-05 DIAGNOSIS — I70269 Atherosclerosis of native arteries of extremities with gangrene, unspecified extremity: Secondary | ICD-10-CM | POA: Diagnosis not present

## 2013-06-05 DIAGNOSIS — I96 Gangrene, not elsewhere classified: Secondary | ICD-10-CM | POA: Diagnosis not present

## 2013-06-05 DIAGNOSIS — I5042 Chronic combined systolic (congestive) and diastolic (congestive) heart failure: Secondary | ICD-10-CM | POA: Diagnosis not present

## 2013-06-05 DIAGNOSIS — E1129 Type 2 diabetes mellitus with other diabetic kidney complication: Secondary | ICD-10-CM | POA: Diagnosis not present

## 2013-06-05 DIAGNOSIS — S98119A Complete traumatic amputation of unspecified great toe, initial encounter: Secondary | ICD-10-CM | POA: Diagnosis not present

## 2013-06-05 DIAGNOSIS — E1159 Type 2 diabetes mellitus with other circulatory complications: Secondary | ICD-10-CM | POA: Diagnosis not present

## 2013-06-06 ENCOUNTER — Other Ambulatory Visit (HOSPITAL_COMMUNITY): Payer: Self-pay | Admitting: Orthopedic Surgery

## 2013-06-07 ENCOUNTER — Encounter (HOSPITAL_COMMUNITY): Payer: Self-pay | Admitting: Pharmacy Technician

## 2013-06-07 DIAGNOSIS — S98119A Complete traumatic amputation of unspecified great toe, initial encounter: Secondary | ICD-10-CM | POA: Diagnosis not present

## 2013-06-07 DIAGNOSIS — I96 Gangrene, not elsewhere classified: Secondary | ICD-10-CM | POA: Diagnosis not present

## 2013-06-07 DIAGNOSIS — N186 End stage renal disease: Secondary | ICD-10-CM | POA: Diagnosis not present

## 2013-06-07 DIAGNOSIS — E1129 Type 2 diabetes mellitus with other diabetic kidney complication: Secondary | ICD-10-CM | POA: Diagnosis not present

## 2013-06-07 DIAGNOSIS — I5042 Chronic combined systolic (congestive) and diastolic (congestive) heart failure: Secondary | ICD-10-CM | POA: Diagnosis not present

## 2013-06-07 DIAGNOSIS — E1159 Type 2 diabetes mellitus with other circulatory complications: Secondary | ICD-10-CM | POA: Diagnosis not present

## 2013-06-08 ENCOUNTER — Other Ambulatory Visit (HOSPITAL_COMMUNITY): Payer: Self-pay | Admitting: *Deleted

## 2013-06-08 ENCOUNTER — Encounter (HOSPITAL_COMMUNITY): Payer: Self-pay | Admitting: *Deleted

## 2013-06-08 MED ORDER — CEFAZOLIN SODIUM-DEXTROSE 2-3 GM-% IV SOLR
2.0000 g | INTRAVENOUS | Status: AC
  Administered 2013-06-09: 2 g via INTRAVENOUS
  Filled 2013-06-08: qty 50

## 2013-06-08 NOTE — Progress Notes (Signed)
Pt was not feeling well enough to talk on the phone for pre-op call, he had his wife talk with me.

## 2013-06-09 ENCOUNTER — Inpatient Hospital Stay (HOSPITAL_COMMUNITY): Payer: Medicare Other | Admitting: Anesthesiology

## 2013-06-09 ENCOUNTER — Encounter (HOSPITAL_COMMUNITY)
Admission: RE | Disposition: A | Discharge status: Home / Self Care | Payer: Self-pay | Source: Ambulatory Visit | Attending: Orthopedic Surgery

## 2013-06-09 ENCOUNTER — Inpatient Hospital Stay (HOSPITAL_COMMUNITY): Payer: Medicare Other

## 2013-06-09 ENCOUNTER — Inpatient Hospital Stay (HOSPITAL_COMMUNITY)
Admission: RE | Admit: 2013-06-09 | Discharge: 2013-06-12 | DRG: 239 | Disposition: A | Discharge status: Home / Self Care | Payer: Medicare Other | Source: Ambulatory Visit | Attending: Orthopedic Surgery | Admitting: Orthopedic Surgery

## 2013-06-09 ENCOUNTER — Encounter (HOSPITAL_COMMUNITY): Payer: Medicare Other | Admitting: Anesthesiology

## 2013-06-09 ENCOUNTER — Encounter (HOSPITAL_COMMUNITY): Payer: Self-pay | Admitting: *Deleted

## 2013-06-09 DIAGNOSIS — N2581 Secondary hyperparathyroidism of renal origin: Secondary | ICD-10-CM | POA: Diagnosis not present

## 2013-06-09 DIAGNOSIS — E1129 Type 2 diabetes mellitus with other diabetic kidney complication: Secondary | ICD-10-CM | POA: Diagnosis not present

## 2013-06-09 DIAGNOSIS — I96 Gangrene, not elsewhere classified: Secondary | ICD-10-CM

## 2013-06-09 DIAGNOSIS — I70269 Atherosclerosis of native arteries of extremities with gangrene, unspecified extremity: Secondary | ICD-10-CM | POA: Diagnosis not present

## 2013-06-09 DIAGNOSIS — E1149 Type 2 diabetes mellitus with other diabetic neurological complication: Secondary | ICD-10-CM | POA: Diagnosis present

## 2013-06-09 DIAGNOSIS — M86179 Other acute osteomyelitis, unspecified ankle and foot: Secondary | ICD-10-CM | POA: Diagnosis not present

## 2013-06-09 DIAGNOSIS — E1029 Type 1 diabetes mellitus with other diabetic kidney complication: Secondary | ICD-10-CM

## 2013-06-09 DIAGNOSIS — G8929 Other chronic pain: Secondary | ICD-10-CM | POA: Diagnosis present

## 2013-06-09 DIAGNOSIS — T84498A Other mechanical complication of other internal orthopedic devices, implants and grafts, initial encounter: Secondary | ICD-10-CM | POA: Diagnosis not present

## 2013-06-09 DIAGNOSIS — M549 Dorsalgia, unspecified: Secondary | ICD-10-CM | POA: Diagnosis present

## 2013-06-09 DIAGNOSIS — N186 End stage renal disease: Secondary | ICD-10-CM | POA: Diagnosis present

## 2013-06-09 DIAGNOSIS — I739 Peripheral vascular disease, unspecified: Secondary | ICD-10-CM | POA: Diagnosis not present

## 2013-06-09 DIAGNOSIS — Z992 Dependence on renal dialysis: Secondary | ICD-10-CM

## 2013-06-09 DIAGNOSIS — Z472 Encounter for removal of internal fixation device: Secondary | ICD-10-CM

## 2013-06-09 DIAGNOSIS — M869 Osteomyelitis, unspecified: Secondary | ICD-10-CM | POA: Diagnosis not present

## 2013-06-09 DIAGNOSIS — N400 Enlarged prostate without lower urinary tract symptoms: Secondary | ICD-10-CM | POA: Diagnosis present

## 2013-06-09 DIAGNOSIS — E1159 Type 2 diabetes mellitus with other circulatory complications: Secondary | ICD-10-CM | POA: Diagnosis present

## 2013-06-09 DIAGNOSIS — L97909 Non-pressure chronic ulcer of unspecified part of unspecified lower leg with unspecified severity: Secondary | ICD-10-CM | POA: Diagnosis not present

## 2013-06-09 DIAGNOSIS — D649 Anemia, unspecified: Secondary | ICD-10-CM | POA: Diagnosis present

## 2013-06-09 DIAGNOSIS — I120 Hypertensive chronic kidney disease with stage 5 chronic kidney disease or end stage renal disease: Secondary | ICD-10-CM | POA: Diagnosis present

## 2013-06-09 DIAGNOSIS — D631 Anemia in chronic kidney disease: Secondary | ICD-10-CM | POA: Diagnosis not present

## 2013-06-09 DIAGNOSIS — Z6826 Body mass index (BMI) 26.0-26.9, adult: Secondary | ICD-10-CM

## 2013-06-09 DIAGNOSIS — L97509 Non-pressure chronic ulcer of other part of unspecified foot with unspecified severity: Secondary | ICD-10-CM | POA: Diagnosis present

## 2013-06-09 DIAGNOSIS — E1142 Type 2 diabetes mellitus with diabetic polyneuropathy: Secondary | ICD-10-CM | POA: Diagnosis present

## 2013-06-09 HISTORY — PX: AMPUTATION: SHX166

## 2013-06-09 HISTORY — PX: HARDWARE REMOVAL: SHX979

## 2013-06-09 LAB — GLUCOSE, CAPILLARY
Glucose-Capillary: 135 mg/dL — ABNORMAL HIGH (ref 70–99)
Glucose-Capillary: 154 mg/dL — ABNORMAL HIGH (ref 70–99)
Glucose-Capillary: 147 mg/dL — ABNORMAL HIGH (ref 70–99)

## 2013-06-09 LAB — COMPREHENSIVE METABOLIC PANEL
ALT: 5 U/L (ref 0–53)
CO2: 28 mEq/L (ref 19–32)
Calcium: 9.6 mg/dL (ref 8.4–10.5)
Creatinine, Ser: 5.99 mg/dL — ABNORMAL HIGH (ref 0.50–1.35)
GFR calc Af Amer: 11 mL/min — ABNORMAL LOW (ref >90)
GFR calc non Af Amer: 9 mL/min — ABNORMAL LOW (ref >90)
Glucose, Bld: 164 mg/dL — ABNORMAL HIGH (ref 70–99)
Total Bilirubin: 0.4 mg/dL (ref 0.3–1.2)

## 2013-06-09 LAB — CBC
Hemoglobin: 11.5 g/dL — ABNORMAL LOW (ref 13.0–17.0)
MCH: 25.3 pg — ABNORMAL LOW (ref 26.0–34.0)
MCHC: 32.5 g/dL (ref 30.0–36.0)
MCV: 78 fL (ref 78.0–100.0)
RBC: 4.54 MIL/uL (ref 4.22–5.81)

## 2013-06-09 LAB — PROTIME-INR
INR: 1.06 (ref 0.00–1.49)
Prothrombin Time: 13.6 seconds (ref 11.6–15.2)

## 2013-06-09 LAB — APTT: aPTT: 35 seconds (ref 24–37)

## 2013-06-09 LAB — EKG 12-LEAD
ECG Heart Rate: 94 /min
ECG QRS Axis: 13 deg
ECG QRS Duration: 104 ms
ECG QT Dispersion: 38 ms
ECG QTC Interval: 480 ms
ECG RR Interval: 638 ms
Q-T Interval: 384 ms
T Axis: 18 deg

## 2013-06-09 SURGERY — AMPUTATION BELOW KNEE
Anesthesia: General | Site: Leg Lower | Laterality: Left | Wound class: Clean

## 2013-06-09 MED ORDER — INSULIN ASPART 100 UNIT/ML ~~LOC~~ SOLN
0.0000 [IU] | Freq: Three times a day (TID) | SUBCUTANEOUS | Status: DC
  Administered 2013-06-10: 2 [IU] via SUBCUTANEOUS
  Administered 2013-06-10 (×2): 1 [IU] via SUBCUTANEOUS
  Administered 2013-06-11 (×2): 2 [IU] via SUBCUTANEOUS
  Administered 2013-06-12: 3 [IU] via SUBCUTANEOUS

## 2013-06-09 MED ORDER — LABETALOL HCL 300 MG PO TABS
300.0000 mg | ORAL_TABLET | Freq: Three times a day (TID) | ORAL | Status: DC
  Filled 2013-06-09 (×2): qty 1

## 2013-06-09 MED ORDER — PROPOFOL 10 MG/ML IV BOLUS
INTRAVENOUS | Status: DC | PRN
  Administered 2013-06-09: 50 mg via INTRAVENOUS
  Administered 2013-06-09: 150 mg via INTRAVENOUS

## 2013-06-09 MED ORDER — FENTANYL CITRATE 0.05 MG/ML IJ SOLN
INTRAMUSCULAR | Status: DC | PRN
  Administered 2013-06-09 (×3): 50 ug via INTRAVENOUS

## 2013-06-09 MED ORDER — ASPIRIN EC 325 MG PO TBEC
325.0000 mg | DELAYED_RELEASE_TABLET | Freq: Every day | ORAL | Status: DC
  Administered 2013-06-12: 325 mg via ORAL
  Filled 2013-06-09 (×4): qty 1

## 2013-06-09 MED ORDER — HYDROMORPHONE HCL PF 1 MG/ML IJ SOLN
0.5000 mg | INTRAMUSCULAR | Status: DC | PRN
  Administered 2013-06-09 – 2013-06-10 (×3): 1 mg via INTRAVENOUS
  Filled 2013-06-09 (×2): qty 1

## 2013-06-09 MED ORDER — LABETALOL HCL 300 MG PO TABS
300.0000 mg | ORAL_TABLET | Freq: Three times a day (TID) | ORAL | Status: DC
  Administered 2013-06-09 – 2013-06-12 (×7): 300 mg via ORAL
  Filled 2013-06-09 (×10): qty 1

## 2013-06-09 MED ORDER — RENA-VITE PO TABS
1.0000 | ORAL_TABLET | Freq: Every day | ORAL | Status: DC
  Administered 2013-06-09 – 2013-06-11 (×3): 1 via ORAL
  Filled 2013-06-09 (×4): qty 1

## 2013-06-09 MED ORDER — OXYCODONE HCL 5 MG PO TABS: 5.0000 mg | ORAL_TABLET | Freq: Once | ORAL | Status: DC | PRN

## 2013-06-09 MED ORDER — CEFAZOLIN SODIUM 1-5 GM-% IV SOLN
1.0000 g | Freq: Two times a day (BID) | INTRAVENOUS | Status: AC
  Administered 2013-06-09 – 2013-06-10 (×2): 1 g via INTRAVENOUS
  Filled 2013-06-09 (×5): qty 50

## 2013-06-09 MED ORDER — TRAZODONE HCL 50 MG PO TABS
50.0000 mg | ORAL_TABLET | Freq: Every evening | ORAL | Status: DC | PRN
  Filled 2013-06-09: qty 1

## 2013-06-09 MED ORDER — HYDROCODONE-ACETAMINOPHEN 5-325 MG PO TABS: 1.0000 | ORAL_TABLET | ORAL | Status: DC | PRN

## 2013-06-09 MED ORDER — 0.9 % SODIUM CHLORIDE (POUR BTL) OPTIME
TOPICAL | Status: DC | PRN
  Administered 2013-06-09: 1000 mL

## 2013-06-09 MED ORDER — METOCLOPRAMIDE HCL 10 MG PO TABS: 5.0000 mg | ORAL_TABLET | Freq: Three times a day (TID) | ORAL | Status: DC | PRN

## 2013-06-09 MED ORDER — HYDROMORPHONE HCL PF 1 MG/ML IJ SOLN
INTRAMUSCULAR | Status: AC
  Administered 2013-06-09: 0.5 mg via INTRAVENOUS
  Filled 2013-06-09: qty 1

## 2013-06-09 MED ORDER — SODIUM CHLORIDE 0.9 % IV SOLN
INTRAVENOUS | Status: DC | PRN
  Administered 2013-06-09: 12:00:00 via INTRAVENOUS

## 2013-06-09 MED ORDER — HALOPERIDOL 5 MG PO TABS
5.0000 mg | ORAL_TABLET | Freq: Every evening | ORAL | Status: DC | PRN
  Filled 2013-06-09: qty 1

## 2013-06-09 MED ORDER — PROMETHAZINE HCL 25 MG/ML IJ SOLN: 6.2500 mg | INTRAMUSCULAR | Status: DC | PRN

## 2013-06-09 MED ORDER — SODIUM CHLORIDE 0.9 % IV SOLN
INTRAVENOUS | Status: DC
  Administered 2013-06-09: 20 mL/h via INTRAVENOUS

## 2013-06-09 MED ORDER — LATANOPROST 0.005 % OP SOLN
1.0000 [drp] | Freq: Every day | OPHTHALMIC | Status: DC
  Administered 2013-06-09 – 2013-06-11 (×3): 1 [drp] via OPHTHALMIC
  Filled 2013-06-09: qty 2.5

## 2013-06-09 MED ORDER — ONDANSETRON HCL 4 MG PO TABS: 4.0000 mg | ORAL_TABLET | Freq: Four times a day (QID) | ORAL | Status: DC | PRN

## 2013-06-09 MED ORDER — METOCLOPRAMIDE HCL 5 MG/ML IJ SOLN: 5.0000 mg | Freq: Three times a day (TID) | INTRAMUSCULAR | Status: DC | PRN

## 2013-06-09 MED ORDER — WARFARIN SODIUM 5 MG PO TABS
5.0000 mg | ORAL_TABLET | Freq: Once | ORAL | Status: AC
  Filled 2013-06-09: qty 1

## 2013-06-09 MED ORDER — OXYCODONE HCL 5 MG/5ML PO SOLN: 5.0000 mg | Freq: Once | ORAL | Status: DC | PRN

## 2013-06-09 MED ORDER — ONDANSETRON HCL 4 MG/2ML IJ SOLN: 4.0000 mg | Freq: Four times a day (QID) | INTRAMUSCULAR | Status: DC | PRN

## 2013-06-09 MED ORDER — PATIENT'S GUIDE TO USING COUMADIN BOOK
Freq: Once | Status: AC
  Administered 2013-06-09: 17:00:00
  Filled 2013-06-09: qty 1

## 2013-06-09 MED ORDER — INSULIN ASPART 100 UNIT/ML ~~LOC~~ SOLN
3.0000 [IU] | Freq: Three times a day (TID) | SUBCUTANEOUS | Status: DC
  Administered 2013-06-10 – 2013-06-12 (×6): 3 [IU] via SUBCUTANEOUS

## 2013-06-09 MED ORDER — HYDROMORPHONE HCL PF 1 MG/ML IJ SOLN
0.2500 mg | INTRAMUSCULAR | Status: DC | PRN
  Administered 2013-06-09 (×3): 0.5 mg via INTRAVENOUS

## 2013-06-09 MED ORDER — NEPRO/CARBSTEADY PO LIQD
237.0000 mL | Freq: Two times a day (BID) | ORAL | Status: DC
  Filled 2013-06-09 (×8): qty 237

## 2013-06-09 MED ORDER — CALCIUM ACETATE 667 MG PO CAPS
1334.0000 mg | ORAL_CAPSULE | Freq: Three times a day (TID) | ORAL | Status: DC
  Administered 2013-06-10 – 2013-06-12 (×7): 1334 mg via ORAL
  Filled 2013-06-09 (×11): qty 2

## 2013-06-09 MED ORDER — WARFARIN VIDEO: Freq: Once | Status: DC

## 2013-06-09 MED ORDER — WARFARIN - PHARMACIST DOSING INPATIENT
Freq: Every day | Status: DC
  Administered 2013-06-09: 18:00:00

## 2013-06-09 MED ORDER — AMLODIPINE BESYLATE 10 MG PO TABS
10.0000 mg | ORAL_TABLET | Freq: Every day | ORAL | Status: DC
  Administered 2013-06-10 – 2013-06-12 (×3): 10 mg via ORAL
  Filled 2013-06-09 (×3): qty 1

## 2013-06-09 MED ORDER — ONDANSETRON HCL 4 MG/2ML IJ SOLN
INTRAMUSCULAR | Status: DC | PRN
  Administered 2013-06-09: 4 mg via INTRAVENOUS

## 2013-06-09 MED ORDER — DOCUSATE SODIUM 100 MG PO CAPS
100.0000 mg | ORAL_CAPSULE | Freq: Two times a day (BID) | ORAL | Status: DC
  Administered 2013-06-09 – 2013-06-12 (×6): 100 mg via ORAL
  Filled 2013-06-09 (×7): qty 1

## 2013-06-09 MED ORDER — OXYCODONE-ACETAMINOPHEN 5-325 MG PO TABS
1.0000 | ORAL_TABLET | ORAL | Status: DC | PRN
  Administered 2013-06-10 – 2013-06-12 (×7): 2 via ORAL
  Filled 2013-06-09 (×7): qty 2

## 2013-06-09 SURGICAL SUPPLY — 70 items
BANDAGE ELASTIC 4 VELCRO ST LF (GAUZE/BANDAGES/DRESSINGS) IMPLANT
BANDAGE ELASTIC 6 VELCRO ST LF (GAUZE/BANDAGES/DRESSINGS) IMPLANT
BANDAGE ESMARK 6X9 LF (GAUZE/BANDAGES/DRESSINGS) ×1 IMPLANT
BANDAGE GAUZE ELAST BULKY 4 IN (GAUZE/BANDAGES/DRESSINGS) ×3 IMPLANT
BLADE SAW RECIP 87.9 MT (BLADE) ×2 IMPLANT
BLADE SURG 21 STRL SS (BLADE) ×2 IMPLANT
BNDG CMPR 9X6 STRL LF SNTH (GAUZE/BANDAGES/DRESSINGS) ×1
BNDG COHESIVE 4X5 TAN STRL (GAUZE/BANDAGES/DRESSINGS) IMPLANT
BNDG COHESIVE 6X5 TAN STRL LF (GAUZE/BANDAGES/DRESSINGS) ×4 IMPLANT
BNDG ESMARK 6X9 LF (GAUZE/BANDAGES/DRESSINGS) ×2
CLOTH BEACON ORANGE TIMEOUT ST (SAFETY) ×1 IMPLANT
COVER SURGICAL LIGHT HANDLE (MISCELLANEOUS) ×2 IMPLANT
CUFF TOURNIQUET SINGLE 34IN LL (TOURNIQUET CUFF) ×1 IMPLANT
CUFF TOURNIQUET SINGLE 44IN (TOURNIQUET CUFF) IMPLANT
DRAIN PENROSE 1/2X12 LTX STRL (WOUND CARE) IMPLANT
DRAPE C-ARM 42X72 X-RAY (DRAPES) IMPLANT
DRAPE EXTREMITY T 121X128X90 (DRAPE) ×1 IMPLANT
DRAPE INCISE IOBAN 66X45 STRL (DRAPES) IMPLANT
DRAPE ORTHO SPLIT 77X108 STRL (DRAPES)
DRAPE PROXIMA HALF (DRAPES) ×2 IMPLANT
DRAPE SURG ORHT 6 SPLT 77X108 (DRAPES) IMPLANT
DRAPE U-SHAPE 47X51 STRL (DRAPES) ×3 IMPLANT
DRSG ADAPTIC 3X8 NADH LF (GAUZE/BANDAGES/DRESSINGS) ×2 IMPLANT
DRSG EMULSION OIL 3X3 NADH (GAUZE/BANDAGES/DRESSINGS) ×1 IMPLANT
DRSG PAD ABDOMINAL 8X10 ST (GAUZE/BANDAGES/DRESSINGS) ×4 IMPLANT
DURAPREP 26ML APPLICATOR (WOUND CARE) ×2 IMPLANT
ELECT REM PT RETURN 9FT ADLT (ELECTROSURGICAL) ×2
ELECTRODE REM PT RTRN 9FT ADLT (ELECTROSURGICAL) ×1 IMPLANT
GLOVE BIOGEL PI IND STRL 7.5 (GLOVE) IMPLANT
GLOVE BIOGEL PI IND STRL 9 (GLOVE) ×1 IMPLANT
GLOVE BIOGEL PI INDICATOR 7.5 (GLOVE) ×1
GLOVE BIOGEL PI INDICATOR 9 (GLOVE) ×1
GLOVE BIOGEL PI ORTHO PRO SZ7 (GLOVE) ×1
GLOVE ECLIPSE 7.0 STRL STRAW (GLOVE) ×1 IMPLANT
GLOVE PI ORTHO PRO STRL SZ7 (GLOVE) IMPLANT
GLOVE SURG ORTHO 9.0 STRL STRW (GLOVE) ×2 IMPLANT
GLOVE SURG SS PI 7.0 STRL IVOR (GLOVE) ×1 IMPLANT
GOWN BRE IMP SLV SIRUS LXLNG (GOWN DISPOSABLE) ×1 IMPLANT
GOWN PREVENTION PLUS XLARGE (GOWN DISPOSABLE) ×2 IMPLANT
GOWN SRG XL XLNG 56XLVL 4 (GOWN DISPOSABLE) ×2 IMPLANT
GOWN STRL NON-REIN XL XLG LVL4 (GOWN DISPOSABLE) ×6
KIT BASIN OR (CUSTOM PROCEDURE TRAY) ×2 IMPLANT
KIT ROOM TURNOVER OR (KITS) ×2 IMPLANT
MANIFOLD NEPTUNE II (INSTRUMENTS) ×2 IMPLANT
NS IRRIG 1000ML POUR BTL (IV SOLUTION) ×2 IMPLANT
PACK GENERAL/GYN (CUSTOM PROCEDURE TRAY) ×1 IMPLANT
PACK ORTHO EXTREMITY (CUSTOM PROCEDURE TRAY) ×2 IMPLANT
PAD ARMBOARD 7.5X6 YLW CONV (MISCELLANEOUS) ×4 IMPLANT
PAD CAST 4YDX4 CTTN HI CHSV (CAST SUPPLIES) ×1 IMPLANT
PADDING CAST COTTON 4X4 STRL (CAST SUPPLIES)
SPONGE GAUZE 4X4 12PLY (GAUZE/BANDAGES/DRESSINGS) ×2 IMPLANT
SPONGE LAP 18X18 X RAY DECT (DISPOSABLE) ×2 IMPLANT
STAPLER VISISTAT 35W (STAPLE) IMPLANT
STOCKINETTE IMPERVIOUS 9X36 MD (GAUZE/BANDAGES/DRESSINGS) IMPLANT
STOCKINETTE IMPERVIOUS LG (DRAPES) ×2 IMPLANT
SUT ETHILON 2 0 PSLX (SUTURE) ×1 IMPLANT
SUT PDS AB 1 CT  36 (SUTURE) ×2
SUT PDS AB 1 CT 36 (SUTURE) IMPLANT
SUT SILK 2 0 (SUTURE) ×2
SUT SILK 2-0 18XBRD TIE 12 (SUTURE) ×1 IMPLANT
SUT VIC AB 0 CT1 27 (SUTURE)
SUT VIC AB 0 CT1 27XBRD ANBCTR (SUTURE) IMPLANT
SUT VIC AB 2-0 CT1 27 (SUTURE)
SUT VIC AB 2-0 CT1 TAPERPNT 27 (SUTURE) IMPLANT
TOWEL OR 17X24 6PK STRL BLUE (TOWEL DISPOSABLE) ×2 IMPLANT
TOWEL OR 17X26 10 PK STRL BLUE (TOWEL DISPOSABLE) ×2 IMPLANT
TUBE ANAEROBIC SPECIMEN COL (MISCELLANEOUS) IMPLANT
TUBE CONNECTING 12X1/4 (SUCTIONS) ×1 IMPLANT
WATER STERILE IRR 1000ML POUR (IV SOLUTION) ×1 IMPLANT
YANKAUER SUCT BULB TIP NO VENT (SUCTIONS) ×1 IMPLANT

## 2013-06-09 NOTE — Discharge Summary (Signed)
Physician Discharge Summary     Patient ID:  Dylan Beasley  [id number]  62 y.o.  03-12-1951    Admit date: 05/17/2013    Discharge date and time:     Admission Diagnoses:   Patient Active Problem List   Diagnosis   ??? Hematuria   ??? Retroperitoneal bleeding   ??? Atrial fibrillation (HCC)   ??? Coagulopathy (HCC)   ??? Retroperitoneal hematoma       Discharge Diagnoses: Supratheraputic inr/coagulopathy    Consults: pulmonary/intensive care while in unit    Procedures: FFP infusion    Hospital Course: The patient is a 62 y.o. male with significant past medical history of Atrial fibrillation diagnosed a bout 8 years ago- has been off coumadin therapy for many years ( he is not sure why it was stopped) who presents with complaitns of blood in urine yesterday which alarmed him. He was recently started on coumadin therapy about 2 weeks ago when he was found to be in afib. He was placed on 10 mg- was scheduled to have INR checked today for the first time according to patient. However he was having flank pain and when he saw blood in the urine, he came in for further eval.  He was transferred to MICU for further eval and treatment secondary to this finding of INR 17 with retroperit bleed.Vitals were stable.    Pt was transferred out of MICU the very next day as FFP resulted in INR normalizing to 1.3. D/t his risk of stroke in the future, he was restarted on 5 mg of coumadin which is half the dose he had been on. He was instructed to have INR cheked the day after discharge at the [place] as well as on a regular basis per [place] reccomendations in order to avoid future coagulopathy. He voiced understanding and was very anxious to be discharge d home. He refused to wait for INR to be theraputic before D/c. In light of previous coagulopathy with combo of lovenox and coumadin, he was not given lovenox at discharge.    No results found for this basename: WBC, HGB, HCT, PLT,  in the last 72 hours    No results found for this basename: NA, K, CL, CO2,  BUN, CREATININE, GLU, CALCIUM,  in the last 72 hours    Ct Abdomen Pelvis Wo Iv Contrast    05/17/2013   CT abdomen and pelvis without contrast 05/17/2013 10:53 a.m.   History: Right-sided pain. Hematuria. On Coumadin.   Comparison: None   Technique: Axial images from the lung bases through the ischial tuberosities without contrast. Sagittal and coronal reformations.   Findings:   Nodular density right lower lobe on slice 3 measures 7.6 mm. Patchy groundglass opacities in the right lung base which is somewhat nodular on slice 9. Groundglass nodule left lung base on slice 10 measures 18 x 19 mm of. Other patchy nodular groundglass opacities left lower lobe. No pneumothorax. No pleural effusion. Upper normal heart size. No pericardial effusion.   Evaluation of the solid organs is limited due to the lack of IV contrast.   No definite focal liver lesions. Gallbladder, spleen, pancreas are unremarkable. Slight thickening of the adrenal glands.   5 mm hypodensity mid pole left kidney on slice 40, too small to characterize, statistically a cyst.   Hyperdensity in the right collecting system which may be blood. There is stranding of the right right retroperitoneum which is hyperdense. Mild dilation of the right collecting system down to level  of the upper to mid pelvis. No obstructing calculus. 7 mm hypodensity mid to upper pole right kidney medially on slice 34.   No significant upper abdominal or retroperitoneal lymphadenopathy.   Atherosclerosis. No aneurysm. Ectasia infrarenal abdominal aorta (2.5 cm).   Unopacified small bowel is unremarkable. Normal appendix. Mild fecal retention.   In the pelvis, bladder is slightly thick wall anteriorly. Small inguinal hernias with fat. No pelvic lymphadenopathy. No free air in the abdomen or pelvis.   Degenerative changes of the spine. No suspicious osseous lesions.      05/17/2013   IMPRESSION: 1. Right-sided retroperitoneal hemorrhage. Hemorrhage in the right collecting system  which causes mild hydronephrosis and proximal hydroureter. Probably related to anticoagulated state. 2. Likely renal cysts, too small characterize. 3. Indeterminate groundglass nodules in the lower lobes. Recommend CT chest.   Discussed with Collins at 11:14 am. CRITICAL RESULTS.      ALERT:  THIS IS AN ABNORMAL REPORT.      Discharge Exam:    HEENT: NCAT,  PERRLA, No JVD  Heart:  RRR, no murmurs, gallops, or rubs.  Lungs:  CTA bilaterally, no wheeze, rales or rhonchi  Abd: bowel sounds present, nontender, nondistended, no masses  Extrem:  No clubbing, cyanosis, or edema    Disposition: home    Patient Instructions:      Medication List      CHANGE how you take these medications         warfarin 5 MG tablet   Commonly known as:  COUMADIN   1 tab po daily- check inr on Tuesday 10/28 and report to [place]   What changed:    - how to take this  - when to take this  - additional instructions         CONTINUE taking these medications         carvedilol 25 MG tablet   Commonly known as:  COREG       cloNIDine 0.3 MG tablet   Commonly known as:  CATAPRES   Take 1 tablet by mouth 2 times daily.       hydrALAZINE 50 MG tablet   Commonly known as:  APRESOLINE       isosorbide mononitrate 60 MG CR tablet   Commonly known as:  IMDUR       lisinopril 10 MG tablet   Commonly known as:  PRINIVIL;ZESTRIL       traMADol 50 MG tablet   Commonly known as:  ULTRAM       Vitamin D 1000 UNITS Caps capsule   Commonly known as:  CHOLECALCIFEROL         Where to Get Your Medications    These are the prescriptions that you need to pick up.        You may get the following medications from any pharmacy   -  warfarin 5 MG tablet                    Activity: activity as tolerated  Diet: regular diet    Pt has been advised to:    Follow-up with No primary provider on file. in 1 week.  Follow-up with consultants as recommended by them    Note that over 30 minutes was spent in preparing discharge papers, discussing discharge with patient, medication  review, etc.    Signed:  Manus Rudd, MD  06/09/2013  10:48 AM

## 2013-06-09 NOTE — Preoperative (Signed)
Beta Blockers   Reason not to administer Beta Blockers:Not Applicable 

## 2013-06-09 NOTE — Progress Notes (Signed)
ANTICOAGULATION CONSULT NOTE - Initial Consult  Pharmacy Consult for Coumadin  Indication: VTE prophylaxis  No Known Allergies  Patient Measurements: Height: 5\' 10"  (177.8 cm) Weight: 200 lb (90.719 kg) IBW/kg (Calculated) : 73  Vital Signs: Temp: 98.2 F (36.8 C) (11/14 1536) Temp src: Oral (11/14 1536) BP: 134/72 mmHg (11/14 1536) Pulse Rate: 74 (11/14 1536)  Labs:  Recent Labs  06/09/13 1055  HGB 11.5*  HCT 35.4*  PLT 462*  APTT 35  LABPROT 13.6  INR 1.06  CREATININE 5.99*    Estimated Creatinine Clearance: 14.7 ml/min (by C-G formula based on Cr of 5.99).   Medical History: Past Medical History  Diagnosis Date  . ALLERGIC RHINITIS 05/05/2007  . BACK PAIN, LUMBAR, CHRONIC 08/06/2009  . BENIGN PROSTATIC HYPERTROPHY 08/01/2010  . CEREBROVASCULAR ACCIDENT, HX OF 08/06/2009  . CHOLELITHIASIS 08/01/2010  . CONGESTIVE HEART FAILURE 03/18/2009  . DEPRESSION 03/18/2009  . DIABETES MELLITUS, TYPE II 03/25/2007  . ERECTILE DYSFUNCTION 03/25/2007  . GERD 03/25/2007  . HEPATITIS C, HX OF 03/25/2007  . HYPERTENSION 03/25/2007  . Morbid obesity 03/25/2007  . NEPHROLITHIASIS, HX OF 03/18/2009  . ESRD on hemodialysis 09/08/2012    ESRD due to DM/HTN. Started dialysis in November 2013. Has LUA AVF placed Jun 14 2012 which is maturing as of Feb 2014.   [name] Renal insufficiency     Assessment: 16 YOM with DM, ESRD, PVD, chronic ulceration of left foot, failed conservative foot salvage therapy, for transtibial amputation. Pharmacy is consulted to start coumadin for VTE prophylaxis. Not on anticoagulation prior to admission, baseline INR 1.06, LFTs wnl, Hgb 11.5, plt 462.  Noted Pt. Had been on coumadin for post ortho surgery VTE prophylaxis in February, he was quite sensitive to coumadin and only required ~ 2.5 mg/day  Goal of Therapy:  INR 2-3 Monitor platelets by anticoagulation protocol: Yes   Plan:  Coumadin 5 mg po x 1 Daily PT/INR Coumadin book and video  Bayard Hugger, PharmD,  BCPS  Clinical Pharmacist  Pager: [phone]   06/09/2013,4:05 PM

## 2013-06-09 NOTE — Op Note (Signed)
OPERATIVE REPORT  DATE OF SURGERY: 06/09/2013  PATIENT:  Oscar Castillo,  62 y.o. male  PRE-OPERATIVE DIAGNOSIS:  Gangrene Left Foot  POST-OPERATIVE DIAGNOSIS:  Gangrene Left Foot  PROCEDURE:  Procedure(s): AMPUTATION BELOW KNEE HARDWARE REMOVAL tibial nail left tibia  SURGEON:  Surgeon(s): Nadara Mustard, MD  ANESTHESIA:   general  EBL:  min ML  SPECIMEN:  Source of Specimen:  Left leg and tibial nail  TOURNIQUET:   Total Tourniquet Time Documented: Thigh (Right) - 8 minutes Total: Thigh (Right) - 8 minutes   PROCEDURE DETAILS: Patient is a 62 year old gentleman with ischemic ulcerations to the left foot. He has failed conservative care and presents at this time for transtibial amputation as well as removal of the tibial nail. Risks and benefits were discussed including infection neurovascular injury nonhealing of the wound need for additional surgery. Patient states he understands and wished to proceed at this time. Description of procedure patient brought to the operating room and underwent a general anesthetic. After adequate levels and anesthesia obtained patient's left lower extremity was prepped using DuraPrep draped into a sterile field an impervious stockinette was used to drape out the foot.  incision was made over the tibia x2 and the 2 proximal locking screws were removed. A transverse incision was then made 11 cm distal to the tibial tubercle this curved proximally and a large posterior flap was created. The tibia was transected just proximal to the skin incision and this was cut circumferentially down to the nail. The fibula was transected just proximal to the tibial incision. An amputation knife was used to create a large posterior flap of the leg was removed with the tibial nail. The sciatic nerve was pulled cut and allowed to retract. The vascular bundles were suture ligated with 2-0 silk. The the tourniquet was deflated hemostasis was obtained. The deep and superficial  fascial layers were closed using #1 PDS. The skin was closed using staples. The wound is covered Adaptic orthopedic sponges AB dressing Kerlix and Coban. Patient was extubated taken to the PACU in stable condition.  PLAN OF CARE: Admit to inpatient   PATIENT DISPOSITION:  PACU - hemodynamically stable.   Nadara Mustard, MD 06/09/2013 1:08 PM

## 2013-06-09 NOTE — Progress Notes (Signed)
[facility]  Patient Status: Active (receiving services up to time of hospitalization)  [facility] is providing the following services: RN and PT  (note plan is SNF)      If patient discharges after hours, please call [phone].   Jodene Nam 06/09/2013, 3:12 PM

## 2013-06-09 NOTE — Consult Note (Signed)
[facility] Renal Consultation Note    Indication for Consultation:  Management of ESRD/hemodialysis; anemia, hypertension/volume and secondary hyperparathyroidism  HPI: Oscar Castillo is a 62 y.o. male ESRD patient with past medical history significant for hypertension, diabetes, peripheral vascular disease and a chronic ulceration of the left foot, status post ray amputation on 10/17, which has failed conservative therapy. He is admitted today for left BKA with Dr. Lajoyce Corners. At the time of this encounter, he has just arrived on the floor from PACU. He is drowsy from anesthesia but arouses easily. His pain is controlled and he denies other emerging complaints.   Past Medical History  Diagnosis Date  . ALLERGIC RHINITIS 05/05/2007  . BACK PAIN, LUMBAR, CHRONIC 08/06/2009  . BENIGN PROSTATIC HYPERTROPHY 08/01/2010  . CEREBROVASCULAR ACCIDENT, HX OF 08/06/2009  . CHOLELITHIASIS 08/01/2010  . CONGESTIVE HEART FAILURE 03/18/2009  . DEPRESSION 03/18/2009  . DIABETES MELLITUS, TYPE II 03/25/2007  . ERECTILE DYSFUNCTION 03/25/2007  . GERD 03/25/2007  . HEPATITIS C, HX OF 03/25/2007  . HYPERTENSION 03/25/2007  . Morbid obesity 03/25/2007  . NEPHROLITHIASIS, HX OF 03/18/2009  . ESRD on hemodialysis 09/08/2012    ESRD due to DM/HTN. Started dialysis in November 2013. Has LUA AVF placed Jun 14 2012 which is maturing as of Feb 2014.   [name] Renal insufficiency    Past Surgical History  Procedure Laterality Date  . Nephrectomy      partial RR  . Av fistula placement  06/14/2012    Procedure: ARTERIOVENOUS (AV) FISTULA CREATION;  Surgeon: Chuck Hint, MD;  Location: [facility] OR;  Service: Vascular;  Laterality: Left;  Left basilic vein transposition with fistula.  . Tibia im nail insertion Left 09/09/2012    Procedure: INTRAMEDULLARY (IM) NAIL TIBIAL;  Surgeon: Eulas Post, MD;  Location: [facility] OR;  Service: Orthopedics;  Laterality: Left;  left tibial nail and open reduction internal fixation left  fibula fracture  . Orif fibula fracture Left 09/09/2012    Procedure: OPEN REDUCTION INTERNAL FIXATION (ORIF) FIBULA FRACTURE;  Surgeon: Eulas Post, MD;  Location: [facility] OR;  Service: Orthopedics;  Laterality: Left;  . Colonoscopy N/A 10/28/2012    Procedure: COLONOSCOPY;  Surgeon: Petra Kuba, MD;  Location: [facility] ENDOSCOPY;  Service: Endoscopy;  Laterality: N/A;  . Esophagogastroduodenoscopy N/A 11/02/2012    Procedure: ESOPHAGOGASTRODUODENOSCOPY (EGD);  Surgeon: Florencia Reasons, MD;  Location: [facility] ENDOSCOPY;  Service: Endoscopy;  Laterality: N/A;  . Colonoscopy N/A 11/02/2012    Procedure: COLONOSCOPY;  Surgeon: Florencia Reasons, MD;  Location: [facility] ENDOSCOPY;  Service: Endoscopy;  Laterality: N/A;  . Colonoscopy N/A 11/03/2012    Procedure: COLONOSCOPY;  Surgeon: Florencia Reasons, MD;  Location: [facility] ENDOSCOPY;  Service: Endoscopy;  Laterality: N/A;  . Givens capsule study N/A 11/04/2012    Procedure: GIVENS CAPSULE STUDY;  Surgeon: Florencia Reasons, MD;  Location: [facility] ENDOSCOPY;  Service: Endoscopy;  Laterality: N/A;  . Enteroscopy N/A 11/08/2012    Procedure: ENTEROSCOPY;  Surgeon: Graylin Shiver, MD;  Location: [facility] ENDOSCOPY;  Service: Endoscopy;  Laterality: N/A;  . Amputation Left 05/12/2013    Procedure: AMPUTATION RAY;  Surgeon: Nadara Mustard, MD;  Location: [facility] OR;  Service: Orthopedics;  Laterality: Left;  Left Foot 1st Ray Amputation  . Eye surgery Left     to remove scar tissue   Family History  Problem Relation Age of Onset  . Coronary artery disease Other   . Diabetes Other    Social History:  reports  that he has never smoked. He has never used smokeless tobacco. He reports that he does not drink alcohol or use illicit drugs. No Known Allergies Prior to Admission medications   Medication Sig Start Date End Date Taking? Authorizing Provider  acetaminophen (TYLENOL) 500 MG tablet Take 500 mg by mouth every 6 (six) hours as needed for mild pain.   Yes Historical Provider, MD  amLODipine  (NORVASC) 10 MG tablet Take 10 mg by mouth daily.   Yes Historical Provider, MD  Brinzolamide-Brimonidine [facility]) 1-0.2 % SUSP Place 1 drop into both eyes 2 (two) times daily.    Yes Historical Provider, MD  calcium acetate (PHOSLO) 667 MG capsule Take 2 capsules (1,334 mg total) by mouth 3 (three) times daily with meals. 06/21/12  Yes Sorin Luanne Bras, MD  docusate sodium (COLACE) 100 MG capsule Take 1 capsule (100 mg total) by mouth 2 (two) times daily. 05/15/13  Yes Marianne L York, PA-C  doxycycline (VIBRAMYCIN) 100 MG capsule Take 100 mg by mouth daily.   Yes Historical Provider, MD  haloperidol (HALDOL) 5 MG tablet Take 1 tablet (5 mg total) by mouth at bedtime as needed for agitation. 05/31/13  Yes Corwin Levins, MD  HYDROcodone-acetaminophen (NORCO) 7.5-325 MG per tablet Take 1 tablet by mouth every 6 (six) hours as needed for moderate pain. 05/31/13  Yes Corwin Levins, MD  Insulin Glargine (LANTUS SOLOSTAR [place]) Inject 5 Units into the skin every evening.   Yes Historical Provider, MD  labetalol (NORMODYNE) 300 MG tablet Take 1 tablet (300 mg total) by mouth 3 (three) times daily. 05/15/13  Yes Tora Kindred York, PA-C  silver sulfADIAZINE (SILVADENE) 1 % cream Apply 1 application topically daily.   Yes Historical Provider, MD  Travoprost, BAK Free, (TRAVATAN) 0.004 % SOLN ophthalmic solution Place 1 drop into both eyes at bedtime. 10/30/12  Yes Belkys A Regalado, MD  traZODone (DESYREL) 50 MG tablet Take 50 mg by mouth at bedtime as needed for sleep.   Yes Historical Provider, MD   Current Facility-Administered Medications  Medication Dose Route Frequency Provider Last Rate Last Dose  . 0.9 %  sodium chloride infusion   Intravenous Continuous Nadara Mustard, MD 20 mL/hr at 06/09/13 1616 20 mL/hr at 06/09/13 1616  . [START ON 06/10/2013] amLODipine (NORVASC) tablet 10 mg  10 mg Oral Daily Nadara Mustard, MD      . aspirin EC tablet 325 mg  325 mg Oral Daily Nadara Mustard, MD      . calcium acetate  (PHOSLO) capsule 1,334 mg  1,334 mg Oral TID WC Nadara Mustard, MD      . ceFAZolin (ANCEF) IVPB 1 g/50 mL premix  1 g Intravenous Q12H Nadara Mustard, MD      . docusate sodium (COLACE) capsule 100 mg  100 mg Oral BID Nadara Mustard, MD      . Melene Muller ON 06/10/2013] feeding supplement (NEPRO CARB STEADY) liquid 237 mL  237 mL Oral BID BM Kerin Salen, PA-C      . haloperidol (HALDOL) tablet 5 mg  5 mg Oral QHS PRN Nadara Mustard, MD      . HYDROcodone-acetaminophen (NORCO/VICODIN) 5-325 MG per tablet 1-2 tablet  1-2 tablet Oral Q4H PRN Nadara Mustard, MD      . HYDROmorphone (DILAUDID) injection 0.5-1 mg  0.5-1 mg Intravenous Q2H PRN Nadara Mustard, MD      . insulin aspart (novoLOG) injection 0-9 Units  0-9  Units Subcutaneous TID WC Nadara Mustard, MD      . insulin aspart (novoLOG) injection 3 Units  3 Units Subcutaneous TID WC Nadara Mustard, MD      . labetalol (NORMODYNE) tablet 300 mg  300 mg Oral TID Kerin Salen, PA-C      . latanoprost (XALATAN) 0.005 % ophthalmic solution 1 drop  1 drop Both Eyes QHS Nadara Mustard, MD      . metoCLOPramide (REGLAN) tablet 5-10 mg  5-10 mg Oral Q8H PRN Nadara Mustard, MD       Or  . metoCLOPramide (REGLAN) injection 5-10 mg  5-10 mg Intravenous Q8H PRN Nadara Mustard, MD      . multivitamin (RENA-VIT) tablet 1 tablet  1 tablet Oral QHS Kerin Salen, PA-C      . ondansetron [facility]) tablet 4 mg  4 mg Oral Q6H PRN Nadara Mustard, MD       Or  . ondansetron [facility]) injection 4 mg  4 mg Intravenous Q6H PRN Nadara Mustard, MD      . oxyCODONE-acetaminophen (PERCOCET/ROXICET) 5-325 MG per tablet 1-2 tablet  1-2 tablet Oral Q4H PRN Nadara Mustard, MD      . traZODone (DESYREL) tablet 50 mg  50 mg Oral QHS PRN Nadara Mustard, MD       Labs: Basic Metabolic Panel:  Recent Labs Lab 06/09/13 1055  NA 134*  K 4.6  CL 92*  CO2 28  GLUCOSE 164*  BUN 19  CREATININE 5.99*  CALCIUM 9.6   Liver Function Tests:  Recent Labs Lab 06/09/13 1055  AST 19  ALT 5   ALKPHOS 64  BILITOT 0.4  PROT 9.3*  ALBUMIN 2.9*   CBC:  Recent Labs Lab 06/09/13 1055  WBC 10.0  HGB 11.5*  HCT 35.4*  MCV 78.0  PLT 462*   CBG:  Recent Labs Lab 06/09/13 1039 06/09/13 1322  GLUCAP 135* 154*   Studies/Results: Dg Chest 2 View  06/09/2013   CLINICAL DATA:  Osteomyelitis  EXAM: CHEST  2 VIEW  COMPARISON:  10/28/2012  FINDINGS: The previously seen dialysis catheter has been removed in the interval. The cardiac shadow is within normal limits. The lungs are well aerated bilaterally. No acute infiltrate or effusion is noted. No bony abnormality is seen.  IMPRESSION: No acute abnormality noted.   Electronically Signed   By: Alcide Clever M.D.   On: 06/09/2013 11:47    ROS: 10 pt ROS attempted. Pt drowsy and unable to participate fully.   Physical Exam: Filed Vitals:   06/09/13 1445 06/09/13 1500 06/09/13 1515 06/09/13 1536  BP: 135/77 138/76 149/77 134/72  Pulse: 73 71 72 74  Temp:   97.6 F (36.4 C) 98.2 F (36.8 C)  TempSrc:    Oral  Resp: 13 13 12 18   Height:      Weight:      SpO2: 97% 95% 98% 100%     General: Well developed, well nourished AAM, drowsy from anesthesia, in no acute distress. Head: Normocephalic, atraumatic, sclera non-icteric, mucus membranes are moist, dentition absent Neck: Supple. JVD not elevated. Lungs: Clear bilaterally to auscultation without wheezes, rales, or rhonchi. Breathing is unlabored. Heart: RRR with S1 S2. No murmurs, rubs, or gallops appreciated. Abdomen: Soft, non-tender, non-distended with normoactive bowel sounds. No rebound/guarding. No obvious abdominal masses. M-S:  Strength and tone appear normal for age. Lower extremities: L stump wrapped/ clean dressings. RLE with compression hose, no  obvious edema. Neuro: Drowsy, Moves all extremities spontaneously. Psych:  Drowsy, unable to evaluate Dialysis Access: LUA AVF + bruit  Dialysis Orders: Center: [place]  on TTS . EDW 88.5kg HD Bath 2K/2CA  Time 4:15  Heparin 2400. Access L AVF BFR 450 DFR A1.5    Hectorol 1 mcg IV/HD Epogen 4600  Units IV/HD  Venofer  0 (Bolus of 4 doses completed 11/4  Recent Labs: Hgb 10.2, Tsat 28%, Ferr 222, P 4.8, PTH 77  Assessment/Plan: 1. Left Foot Gangrene - S/p left BKA on 11/14 per Dr. Lajoyce Corners 2. ESRD -  TTS, K+ 4.6, HD tomorrow, no heparin 3. Hypertension/volume  - SBPs 130s -140s. On amlodipine 10 and labetalol 300 mg TID at home ( hold for SBP <108) Up 2kg by weights. CXR clear. Needs new edw, eval post wgt tomorrow. Try for UF 3L 4. Anemia  - Hgb 11.5 > 10.2 per most recent op labs. Anticipate drop post op. CBC in the am, add Aranesp for Hgb < 11 and transfuse PRN. No Fe - Just completed Venofer bolus op x 4 doses on 11/4. 5. Metabolic bone disease -  Ca 9.6 (10.5 Corrected) Phos controlled op. Last PTH 77. Will hold op hectorol for low PTH and hypercalcemia. Continue low Ca bath. On Phoslo 2 ac op which he takes sparingly. Follow labs. May need non-Ca containing binder at D/c. Will consider Renvela due to poor dentition.  6. Nutrition - Alb 2.9, Full liquids for now. Renal diet after midnight. Multivitamin, nepro. 7. DM - on insulin 8. Dispo - SNF per ortho   Claud Kelp, PA-C [facility] Pager [phone] 06/09/2013, 4:23 PM

## 2013-06-09 NOTE — Transfer of Care (Signed)
Immediate Anesthesia Transfer of Care Note  Patient: Oscar Castillo  Procedure(s) Performed: Procedure(s) with comments: AMPUTATION BELOW KNEE (Left) - Left Below Knee Amputation and removal proximal screws IM tibial nail HARDWARE REMOVAL (Left) - Left Below Knee Amputation  and Removal proximal screws IM tibial nail  Patient Location: PACU  Anesthesia Type:General  Level of Consciousness: awake, alert , oriented and sedated  Airway & Oxygen Therapy: Patient Spontanous Breathing and Patient connected to nasal cannula oxygen  Post-op Assessment: Report given to PACU RN, Post -op Vital signs reviewed and stable and Patient moving all extremities  Post vital signs: Reviewed and stable  Complications: No apparent anesthesia complications

## 2013-06-09 NOTE — H&P (Signed)
Oscar Castillo is an 62 y.o. male.   Chief Complaint: Ulceration osteomyelitis and gangrene left foot HPI: Patient is a 62 year old gentleman with peripheral vascular disease diabetes with chronic ulceration of the left foot. Patient is undergone prolonged conservative therapy for foot salvage. Do to failure of conservative therapy patient presents at this time for transtibial amputation. Patient does have an intramedullary nail in the left tibia and will require removal of the tibial nail with removal of proximal interlocking screws.  Past Medical History  Diagnosis Date  . ALLERGIC RHINITIS 05/05/2007  . BACK PAIN, LUMBAR, CHRONIC 08/06/2009  . BENIGN PROSTATIC HYPERTROPHY 08/01/2010  . CEREBROVASCULAR ACCIDENT, HX OF 08/06/2009  . CHOLELITHIASIS 08/01/2010  . CONGESTIVE HEART FAILURE 03/18/2009  . DEPRESSION 03/18/2009  . DIABETES MELLITUS, TYPE II 03/25/2007  . ERECTILE DYSFUNCTION 03/25/2007  . GERD 03/25/2007  . HEPATITIS C, HX OF 03/25/2007  . HYPERTENSION 03/25/2007  . Morbid obesity 03/25/2007  . NEPHROLITHIASIS, HX OF 03/18/2009  . ESRD on hemodialysis 09/08/2012    ESRD due to DM/HTN. Started dialysis in November 2013. Has LUA AVF placed Jun 14 2012 which is maturing as of Feb 2014.   [name] Renal insufficiency     Past Surgical History  Procedure Laterality Date  . Nephrectomy      partial RR  . Av fistula placement  06/14/2012    Procedure: ARTERIOVENOUS (AV) FISTULA CREATION;  Surgeon: Chuck Hint, MD;  Location: [facility] OR;  Service: Vascular;  Laterality: Left;  Left basilic vein transposition with fistula.  . Tibia im nail insertion Left 09/09/2012    Procedure: INTRAMEDULLARY (IM) NAIL TIBIAL;  Surgeon: Eulas Post, MD;  Location: [facility] OR;  Service: Orthopedics;  Laterality: Left;  left tibial nail and open reduction internal fixation left fibula fracture  . Orif fibula fracture Left 09/09/2012    Procedure: OPEN REDUCTION INTERNAL FIXATION (ORIF) FIBULA FRACTURE;  Surgeon: Eulas Post, MD;  Location: [facility] OR;  Service: Orthopedics;  Laterality: Left;  . Colonoscopy N/A 10/28/2012    Procedure: COLONOSCOPY;  Surgeon: Petra Kuba, MD;  Location: [facility] ENDOSCOPY;  Service: Endoscopy;  Laterality: N/A;  . Esophagogastroduodenoscopy N/A 11/02/2012    Procedure: ESOPHAGOGASTRODUODENOSCOPY (EGD);  Surgeon: Florencia Reasons, MD;  Location: [facility] ENDOSCOPY;  Service: Endoscopy;  Laterality: N/A;  . Colonoscopy N/A 11/02/2012    Procedure: COLONOSCOPY;  Surgeon: Florencia Reasons, MD;  Location: [facility] ENDOSCOPY;  Service: Endoscopy;  Laterality: N/A;  . Colonoscopy N/A 11/03/2012    Procedure: COLONOSCOPY;  Surgeon: Florencia Reasons, MD;  Location: [facility] ENDOSCOPY;  Service: Endoscopy;  Laterality: N/A;  . Givens capsule study N/A 11/04/2012    Procedure: GIVENS CAPSULE STUDY;  Surgeon: Florencia Reasons, MD;  Location: [facility] ENDOSCOPY;  Service: Endoscopy;  Laterality: N/A;  . Enteroscopy N/A 11/08/2012    Procedure: ENTEROSCOPY;  Surgeon: Graylin Shiver, MD;  Location: [facility] ENDOSCOPY;  Service: Endoscopy;  Laterality: N/A;  . Amputation Left 05/12/2013    Procedure: AMPUTATION RAY;  Surgeon: Nadara Mustard, MD;  Location: [facility] OR;  Service: Orthopedics;  Laterality: Left;  Left Foot 1st Ray Amputation  . Eye surgery Left     to remove scar tissue    Family History  Problem Relation Age of Onset  . Coronary artery disease Other   . Diabetes Other    Social History:  reports that he has never smoked. He has never used smokeless tobacco. He reports that he does not drink alcohol or use illicit  drugs.  Allergies: No Known Allergies  No prescriptions prior to admission    No results found for this or any previous visit (from the past 48 hour(s)). No results found.  Review of Systems  All other systems reviewed and are negative.    There were no vitals taken for this visit. Physical Exam  On examination patient has progressive gangrenous necrotic changes with osteomyelitis and ulceration left  foot Assessment/Plan Assessment: Diabetic insensate neuropathy with peripheral vascular disease osteomyelitis ulceration left foot with retained intramedullary nail left tibia.  Plan: Will plan for removal of intramedullary nail and a transtibial amputation. Risks and benefits were discussed including infection neurovascular injury pain DVT pulmonary embolus need for additional surgery nonhealing of the wound. Patient states he understands and wished to proceed at this time.  DUDA,MARCUS V 06/09/2013, 6:15 AM

## 2013-06-09 NOTE — Consult Note (Signed)
I saw the patient and agree with the above assessment and plan.    

## 2013-06-09 NOTE — Anesthesia Preprocedure Evaluation (Addendum)
Anesthesia Evaluation  Patient identified by MRN, date of birth, ID band Patient awake    Reviewed: Allergy & Precautions, H&P , NPO status , Patient's Chart, lab work & pertinent test results  Airway Mallampati: II TM Distance: >3 FB Neck ROM: Full    Dental  (+) Poor Dentition and Dental Advisory Given   Pulmonary neg pulmonary ROS,    Pulmonary exam normal       Cardiovascular hypertension, +CHF     Neuro/Psych Depression TIA   GI/Hepatic GERD-  ,(+) Hepatitis -, C  Endo/Other  diabetes, Insulin Dependent  Renal/GU Renal disease     Musculoskeletal   Abdominal   Peds  Hematology   Anesthesia Other Findings   Reproductive/Obstetrics                          Anesthesia Physical Anesthesia Plan  ASA: III  Anesthesia Plan: General   Post-op Pain Management:    Induction: Intravenous  Airway Management Planned: LMA  Additional Equipment:   Intra-op Plan:   Post-operative Plan: Extubation in OR  Informed Consent: I have reviewed the patients History and Physical, chart, labs and discussed the procedure including the risks, benefits and alternatives for the proposed anesthesia with the patient or authorized representative who has indicated his/her understanding and acceptance.   Dental advisory given  Plan Discussed with: Anesthesiologist, CRNA and Surgeon  Anesthesia Plan Comments:        Anesthesia Quick Evaluation

## 2013-06-09 NOTE — Progress Notes (Signed)
Plan for discharge to skilled nursing facility. 

## 2013-06-09 NOTE — Progress Notes (Signed)
PATIENT WAS ABLE TO VERBALIZE PROCEDURE TO BE DONE.

## 2013-06-10 LAB — GLUCOSE, CAPILLARY
Glucose-Capillary: 136 mg/dL — ABNORMAL HIGH (ref 70–99)
Glucose-Capillary: 146 mg/dL — ABNORMAL HIGH (ref 70–99)
Glucose-Capillary: 186 mg/dL — ABNORMAL HIGH (ref 70–99)
Glucose-Capillary: 142 mg/dL — ABNORMAL HIGH (ref 70–99)

## 2013-06-10 LAB — CBC
Hemoglobin: 9.9 g/dL — ABNORMAL LOW (ref 13.0–17.0)
MCV: 78.6 fL (ref 78.0–100.0)
Platelets: 461 10*3/uL — ABNORMAL HIGH (ref 150–400)
RDW: 23.3 % — ABNORMAL HIGH (ref 11.5–15.5)
WBC: 9.4 10*3/uL (ref 4.0–10.5)

## 2013-06-10 LAB — RENAL FUNCTION PANEL
Albumin: 2.5 g/dL — ABNORMAL LOW (ref 3.5–5.2)
BUN: 27 mg/dL — ABNORMAL HIGH (ref 6–23)
CO2: 27 mEq/L (ref 19–32)
Phosphorus: 5.7 mg/dL — ABNORMAL HIGH (ref 2.3–4.6)
Potassium: 4.4 mEq/L (ref 3.5–5.1)
Sodium: 134 mEq/L — ABNORMAL LOW (ref 135–145)

## 2013-06-10 LAB — PROTIME-INR
INR: 0.99 (ref 0.00–1.49)
Prothrombin Time: 12.9 seconds (ref 11.6–15.2)

## 2013-06-10 MED ORDER — WARFARIN SODIUM 5 MG PO TABS
5.0000 mg | ORAL_TABLET | Freq: Once | ORAL | Status: AC
  Filled 2013-06-10: qty 1

## 2013-06-10 MED ORDER — HYDROMORPHONE HCL PF 1 MG/ML IJ SOLN
INTRAMUSCULAR | Status: AC
  Filled 2013-06-10: qty 1

## 2013-06-10 NOTE — Progress Notes (Signed)
Patient ID: Oscar Castillo, male   DOB: [birth year], 62 y.o.   MRN: [id number] Subjective: 1 Day Post-Op Procedure(s) (LRB): AMPUTATION BELOW KNEE (Left) HARDWARE REMOVAL (Left) Awake, alert, oriented x 2. I have my wife and she can talk to you. Where is the phone. Keep the lights on. Pain level is mild. Patient reports pain as mild.    Objective:   VITALS:  Temp:  [98.1 F (36.7 C)-98.8 F (37.1 C)] 98.6 F (37 C) (11/15 1301) Pulse Rate:  [72-82] 82 (11/15 1643) Resp:  [18] 18 (11/15 1301) BP: (120-173)/(63-87) 125/67 mmHg (11/15 1643) SpO2:  [97 %-100 %] 97 % (11/15 1301) Weight:  [84.2 kg (185 lb 10 oz)] 84.2 kg (185 lb 10 oz) (11/15 0741)  ABD soft Incision: dressing C/D/I   LABS  Recent Labs  06/09/13 1055 06/10/13 0340  HGB 11.5* 9.9*  WBC 10.0 9.4  PLT 462* 461*    Recent Labs  06/09/13 1055 06/10/13 0340  NA 134* 134*  K 4.6 4.4  CL 92* 94*  CO2 28 27  BUN 19 27*  CREATININE 5.99* 7.51*  GLUCOSE 164* 154*    Recent Labs  06/09/13 1055 06/10/13 0340  INR 1.06 0.99     Assessment/Plan: 1 Day Post-Op Procedure(s) (LRB): AMPUTATION BELOW KNEE (Left) HARDWARE REMOVAL (Left)  Advance diet Up with therapy Discharge to SNF Encourage patient, external stimuli, TV. Dr. Lajoyce Corners has recommended SNF.  NITKA,JAMES E 06/10/2013, 6:46 PM

## 2013-06-10 NOTE — Progress Notes (Signed)
Pt and wife refused coumadin.  Says he was in hospital x  2 in April for rectal bleeding, pt states he was told not to take coumadin.

## 2013-06-10 NOTE — Progress Notes (Signed)
PT Cancellation Note  Patient Details Name: Oscar Castillo MRN: [id number] DOB: [birth year]   Cancelled Treatment:    Reason Eval/Treat Not Completed: Fatigue/lethargy limiting ability to participate. Pt at HD all morning and states he is too tired to participate. Offered to check in later this afternoon for second attempt, however pt asked that PT waited until tomorrow. Will continue to follow.   Ruthann Cancer 06/10/2013, 2:06 PM  Ruthann Cancer, PT, DPT [phone]

## 2013-06-10 NOTE — Procedures (Signed)
I was present at this dialysis session. I have reviewed the session itself and made appropriate changes.   Doing well. AVF.  Some post surgical pain.   Sabra Heck  MD 06/10/2013, 9:00 AM

## 2013-06-11 LAB — GLUCOSE, CAPILLARY
Glucose-Capillary: 183 mg/dL — ABNORMAL HIGH (ref 70–99)
Glucose-Capillary: 165 mg/dL — ABNORMAL HIGH (ref 70–99)
Glucose-Capillary: 134 mg/dL — ABNORMAL HIGH (ref 70–99)

## 2013-06-11 LAB — PROTIME-INR
INR: 1.16 (ref 0.00–1.49)
Prothrombin Time: 14.6 seconds (ref 11.6–15.2)

## 2013-06-11 MED ORDER — DARBEPOETIN ALFA-POLYSORBATE 60 MCG/0.3ML IJ SOLN: 60.0000 ug | INTRAMUSCULAR | Status: DC

## 2013-06-11 MED ORDER — WARFARIN SODIUM 5 MG PO TABS
5.0000 mg | ORAL_TABLET | Freq: Once | ORAL | Status: DC
  Filled 2013-06-11: qty 1

## 2013-06-11 NOTE — Progress Notes (Signed)
Admit: 06/09/2013 LOS: 2  3M ESRD [facility] TTS s/p L BKA 11/14 for L foot gangrene  Subjective:  HD yesterday,  2.9L UF NAEON  11/15 0701 - 11/16 0700 In: 664.7 [P.O.:480; I.V.:184.7] Out: 2900   Filed Weights   06/09/13 1028 06/10/13 0741  Weight: 90.719 kg (200 lb) 84.2 kg (185 lb 10 oz)    Current meds: reviewed including warfarin Current Labs: reviewed   Outpt HD Orders Unit: [facility] Days: [facility] Time: 4h34min EDW: 88.5kg (pre BKA) K/Ca: 2K/2Ca Access: AVF BFR: 450 VDRA: Hectorol 1qTx EPO: 4600 qTx IV Fe: s/p recent bolus, completed 11/4     Physical Exam:  Blood pressure 137/76, pulse 75, temperature 97.9 F (36.6 C), temperature source Oral, resp. rate 18, height 5\' 10"  (1.778 m), weight 84.2 kg (185 lb 10 oz), SpO2 100.00%. NAD RRR CTAB Stump is bandaged Nonfocal  Assessment/Plan 1. S/p L BKA 11/14: per Dr. Lajoyce Corners 2. ESRD on HD TTS via AVF; Next HD on Tuesday. 3. HTN/Vol: Did well with 3L UF yesterday.  No post weight.  BP ok.  Will need to nail down new EDW 4. Anemia: Follow.  Add Aranesp 60 for next HD if here.  Post op drop expected. 5. MBD: On PhosLo.  VDRA on hold currently.    Sabra Heck MD 06/11/2013, 9:44 AM   Recent Labs Lab 06/09/13 1055 06/10/13 0340  NA 134* 134*  K 4.6 4.4  CL 92* 94*  CO2 28 27  GLUCOSE 164* 154*  BUN 19 27*  CREATININE 5.99* 7.51*  CALCIUM 9.6 9.3  PHOS  --  5.7*    Recent Labs Lab 06/09/13 1055 06/10/13 0340  WBC 10.0 9.4  HGB 11.5* 9.9*  HCT 35.4* 31.2*  MCV 78.0 78.6  PLT 462* 461*    Current Facility-Administered Medications  Medication Dose Route Frequency Provider Last Rate Last Dose  . 0.9 %  sodium chloride infusion   Intravenous Continuous Nadara Mustard, MD 20 mL/hr at 06/10/13 1315    . amLODipine (NORVASC) tablet 10 mg  10 mg Oral Daily Nadara Mustard, MD   10 mg at 06/10/13 1328  . aspirin EC tablet 325 mg  325 mg Oral Daily Nadara Mustard, MD      . calcium acetate (PHOSLO) capsule 1,334 mg   1,334 mg Oral TID WC Nadara Mustard, MD   1,334 mg at 06/11/13 0715  . docusate sodium (COLACE) capsule 100 mg  100 mg Oral BID Nadara Mustard, MD   100 mg at 06/10/13 2106  . feeding supplement (NEPRO CARB STEADY) liquid 237 mL  237 mL Oral BID BM Kerin Salen, PA-C      . haloperidol (HALDOL) tablet 5 mg  5 mg Oral QHS PRN Nadara Mustard, MD      . HYDROcodone-acetaminophen (NORCO/VICODIN) 5-325 MG per tablet 1-2 tablet  1-2 tablet Oral Q4H PRN Nadara Mustard, MD      . HYDROmorphone (DILAUDID) injection 0.5-1 mg  0.5-1 mg Intravenous Q2H PRN Nadara Mustard, MD   1 mg at 06/10/13 1248  . insulin aspart (novoLOG) injection 0-9 Units  0-9 Units Subcutaneous TID WC Nadara Mustard, MD   2 Units at 06/10/13 1643  . insulin aspart (novoLOG) injection 3 Units  3 Units Subcutaneous TID WC Nadara Mustard, MD   3 Units at 06/11/13 [phone]  . labetalol (NORMODYNE) tablet 300 mg  300 mg Oral TID Kerin Salen, PA-C   300  mg at 06/10/13 2106  . latanoprost (XALATAN) 0.005 % ophthalmic solution 1 drop  1 drop Both Eyes QHS Nadara Mustard, MD   1 drop at 06/10/13 2106  . metoCLOPramide (REGLAN) tablet 5-10 mg  5-10 mg Oral Q8H PRN Nadara Mustard, MD       Or  . metoCLOPramide (REGLAN) injection 5-10 mg  5-10 mg Intravenous Q8H PRN Nadara Mustard, MD      . multivitamin (RENA-VIT) tablet 1 tablet  1 tablet Oral QHS Kerin Salen, PA-C   1 tablet at 06/10/13 2106  . ondansetron (ZOFRAN) tablet 4 mg  4 mg Oral Q6H PRN Nadara Mustard, MD       Or  . ondansetron [facility]) injection 4 mg  4 mg Intravenous Q6H PRN Nadara Mustard, MD      . oxyCODONE-acetaminophen (PERCOCET/ROXICET) 5-325 MG per tablet 1-2 tablet  1-2 tablet Oral Q4H PRN Nadara Mustard, MD   2 tablet at 06/11/13 [phone]  . traZODone (DESYREL) tablet 50 mg  50 mg Oral QHS PRN Nadara Mustard, MD      . warfarin (COUMADIN) tablet 5 mg  5 mg Oral ONCE-1800 Nadara Mustard, MD      . warfarin (COUMADIN) tablet 5 mg  5 mg Oral ONCE-1800 Nadara Mustard, MD      . warfarin  (COUMADIN) video   Does not apply Once Nadara Mustard, MD      . Warfarin - Pharmacist Dosing Inpatient   Does not apply [id number] Nadara Mustard, MD

## 2013-06-11 NOTE — Evaluation (Signed)
Physical Therapy Evaluation Patient Details Name: Oscar Castillo MRN: [id number] DOB: [birth year] Today's Date: 06/11/2013 Time: 4098-1191 PT Time Calculation (min): 14 min  PT Assessment / Plan / Recommendation History of Present Illness  Pt. admitted with gangrene L foot and underwent BKA with removal of hardware (L tibila nail) Pt. With extensive medical history including diabetes, PVD with chronic ulceration and HD Tu/Th/Sat.  Pt. Reports wife takes him to HD in w/c and car  Clinical Impression  Pt. Presents to PT with decrease in functional mobility and gait due to BKA L LE yesterday.  He will benefit from acute PT to address his mobility and below issues.  It is highly likely that he will benefit from and will need SNF level therapies prior to DCing back home with wife.  At this point, he is agreeable to this recommendation.    PT Assessment  Patient needs continued PT services    Follow Up Recommendations  SNF;Supervision/Assistance - 24 hour;Supervision for mobility/OOB    Does the patient have the potential to tolerate intense rehabilitation      Barriers to Discharge Decreased caregiver support      Equipment Recommendations  None recommended by PT    Recommendations for Other Services     Frequency Min 3X/week    Precautions / Restrictions Precautions Precautions: Fall Restrictions Weight Bearing Restrictions: Yes LLE Weight Bearing: Non weight bearing   Pertinent Vitals/Pain See vitals tab       Mobility  Bed Mobility Bed Mobility: Supine to Sit;Sitting - Scoot to Edge of Bed;Sit to Supine Supine to Sit: 1: +2 Total assist Supine to Sit: Patient Percentage: 50% Sitting - Scoot to Edge of Bed: 3: Mod assist Sit to Supine: 1: +2 Total assist;HOB flat Sit to Supine: Patient Percentage: 40% Details for Bed Mobility Assistance: cues for sequencing and technique Transfers Transfers: Not assessed (pt. unwilling to attempt) Ambulation/Gait Ambulation/Gait  Assistance: Not tested (comment)    Exercises Amputee Exercises Quad Sets: AROM;Left;5 reps;Supine   PT Diagnosis: Difficulty walking;Abnormality of gait;Acute pain;Generalized weakness  PT Problem List: Decreased strength;Decreased activity tolerance;Decreased balance;Decreased mobility;Decreased knowledge of use of DME;Decreased knowledge of precautions;Pain PT Treatment Interventions: DME instruction;Gait training;Functional mobility training;Therapeutic activities;Therapeutic exercise;Balance training;Patient/family education     PT Goals(Current goals can be found in the care plan section) Acute Rehab PT Goals Patient Stated Goal: pt. did not state PT Goal Formulation: With patient Time For Goal Achievement: 06/18/13 Potential to Achieve Goals: Fair  Visit Information  Last PT Received On: 06/11/13 Assistance Needed: +2 PT/OT Co-Evaluation/Treatment: Yes History of Present Illness: Pt. admitted with gangrene L foot and underwent BKA with removal of hardware (L tibila nail)       Prior Functioning  Home Living Family/patient expects to be discharged to:: Skilled nursing facility (Pt. states he is agreeable to rehab before home) Living Arrangements: Spouse/significant other Available Help at Discharge: Family Type of Home: House Home Access: Stairs to enter Secretary/administrator of Steps: 1 Entrance Stairs-Rails: None Home Layout: One level Home Equipment: Environmental consultant - 2 wheels;Cane - single point;Bedside commode;Shower seat Additional Comments: pt with questionable history, increased time required for report Prior Function Level of Independence: Independent with assistive device(s);Needs assistance Gait / Transfers Assistance Needed: using RW, musltiple falls Communication Communication: No difficulties;Other (comment) (slow to respond at times)    Cognition  Cognition Arousal/Alertness: Awake/alert Behavior During Therapy:  (slow to respond) Overall Cognitive Status:  No family/caregiver present to determine baseline cognitive functioning    Extremity/Trunk Assessment  Upper Extremity Assessment Upper Extremity Assessment: Generalized weakness Lower Extremity Assessment Lower Extremity Assessment: LLE deficits/detail LLE Deficits / Details: generalized weakness noted in residual limb with fair quad set LLE: Unable to fully assess due to pain   Balance Balance Balance Assessed: Yes Static Sitting Balance Static Sitting - Balance Support: Bilateral upper extremity supported;Feet supported (R foot supported) Static Sitting - Level of Assistance: 5: Stand by assistance Static Sitting - Comment/# of Minutes: 5  End of Session    GP     Ferman Hamming 06/11/2013, 12:59 PM .Weldon Picking PT Acute Rehab Services [phone] [place] [phone]'

## 2013-06-11 NOTE — Progress Notes (Signed)
Patient ID: SALLIE MAKER, male   DOB: August 30, 1950, 62 y.o.   MRN: [id number] Subjective: 2 Days Post-Op Procedure(s) (LRB): AMPUTATION BELOW KNEE (Left) HARDWARE REMOVAL (Left) Awake, alert and Oriented x 3. Moderate pain.More comforable this AM.  Patient reports pain as moderate.    Objective:   VITALS:  Temp:  [97.9 F (36.6 C)-99.2 F (37.3 C)] 97.9 F (36.6 C) (11/16 0548) Pulse Rate:  [72-83] 83 (11/16 1002) Resp:  [18] 18 (11/16 0548) BP: (120-142)/(64-76) 141/70 mmHg (11/16 1002) SpO2:  [97 %-100 %] 100 % (11/16 0548)  ABD soft Incision: dressing C/D/I No cellulitis present Compartment soft   LABS  Recent Labs  06/09/13 1055 06/10/13 0340  HGB 11.5* 9.9*  WBC 10.0 9.4  PLT 462* 461*    Recent Labs  06/09/13 1055 06/10/13 0340  NA 134* 134*  K 4.6 4.4  CL 92* 94*  CO2 28 27  BUN 19 27*  CREATININE 5.99* 7.51*  GLUCOSE 164* 154*    Recent Labs  06/10/13 0340 06/11/13 0430  INR 0.99 1.16     Assessment/Plan: 2 Days Post-Op Procedure(s) (LRB): AMPUTATION BELOW KNEE (Left) HARDWARE REMOVAL (Left)  Advance diet Up with therapy D/C IV fluids Discharge to SNF  NITKA,JAMES E 06/11/2013, 10:12 AM

## 2013-06-11 NOTE — Progress Notes (Signed)
Clinical Social Work Department CLINICAL SOCIAL WORK PLACEMENT NOTE 06/11/2013  Patient:  Oscar Castillo, Oscar Castillo  Account Number:  [id number] Admit date:  06/09/2013  Clinical Social Worker:  Jetta Lout, [place]  Date/time:  06/11/2013 02:40 PM  Clinical Social Work is seeking post-discharge placement for this patient at the following level of care:   SKILLED NURSING   (*CSW will update this form in Epic as items are completed)   06/11/2013  Patient/family provided with [facility] Department of Clinical Social Work's list of facilities offering this level of care within the geographic area requested by the patient (or if unable, by the patient's family).  06/11/2013  Patient/family informed of their freedom to choose among providers that offer the needed level of care, that participate in Medicare, Medicaid or managed care program needed by the patient, have an available bed and are willing to accept the patient.  06/11/2013  Patient/family informed of [facility]' ownership interest in [facility], as well as of the fact that they are under no obligation to receive care at this facility.  PASARR submitted to EDS on 06/11/2013 PASARR number received from EDS on 06/11/2013  FL2 transmitted to all facilities in geographic area requested by pt/family on  06/11/2013 FL2 transmitted to all facilities within larger geographic area on   Patient informed that his/her managed care company has contracts with or will negotiate with  certain facilities, including the following:     Patient/family informed of bed offers received:   Patient chooses bed at  Physician recommends and patient chooses bed at    Patient to be transferred to  on   Patient to be transferred to facility by   The following physician request were entered in Epic:   Additional Comments: Patient does not have a preference at this point.

## 2013-06-11 NOTE — Progress Notes (Signed)
ANTICOAGULATION CONSULT NOTE   Pharmacy Consult for Coumadin  Indication: VTE prophylaxis  No Known Allergies  Labs:  Recent Labs  06/09/13 1055 06/10/13 0340 06/11/13 0430  HGB 11.5* 9.9*  --   HCT 35.4* 31.2*  --   PLT 462* 461*  --   APTT 35  --   --   LABPROT 13.6 12.9 14.6  INR 1.06 0.99 1.16  CREATININE 5.99* 7.51*  --     Estimated Creatinine Clearance: 10.7 ml/min (by C-G formula based on Cr of 7.51).  Assessment: 17 YOM with DM, ESRD, PVD, chronic ulceration of left foot, failed conservative foot salvage therapy, for transtibial amputation. Pharmacy is consulted to start coumadin for VTE prophylaxis. Not on anticoagulation prior to admission, baseline INR 1.06, LFTs wnl, Hgb 11.5, plt 462.   Noted Pt. Had been on coumadin for post ortho surgery VTE prophylaxis in February, he was quite sensitive to coumadin and only required ~ 2.5 mg/day  INR = 1.16, refusing to take Coumadin, no doses taken 11/14 or 11/15  Goal of Therapy:  INR 2-3 Monitor platelets by anticoagulation protocol: Yes   Plan:  Repeat Coumadin 5 mg po x 1 Daily PT/INR  Thank you. Okey Regal, PharmD [phone]   06/11/2013,8:59 AM

## 2013-06-11 NOTE — Evaluation (Signed)
Occupational Therapy Evaluation Patient Details Name: Oscar Castillo MRN: [id number] DOB: 04/30/1951 Today's Date: 06/11/2013 Time: 1478-2956 OT Time Calculation (min): 15 min  OT Assessment / Plan / Recommendation History of present illness Pt. admitted with gangrene L foot and underwent BKA with removal of hardware (L tibila nail)   Clinical Impression   Pt s/p L BKA.  Will benefit from continued acute OT services to address below problem list. Recommending SNF to further progress rehab before eventual return home with wife.  OT Assessment  Patient needs continued OT Services    Follow Up Recommendations  SNF;Supervision/Assistance - 24 hour    Barriers to Discharge      Equipment Recommendations   (TBD next venue)    Recommendations for Other Services    Frequency  Min 2X/week    Precautions / Restrictions Precautions Precautions: Fall Restrictions Weight Bearing Restrictions: Yes LLE Weight Bearing: Non weight bearing   Pertinent Vitals/Pain See vitals   ADL  ADL Comments: Limited ADL participation.  Max encouragment needed to sit up on EOB.  Once sitting EOB, pt reported he needed to use his urinal. Pt voided in urinal leaning back on bed. Once finished voiding, pt requesting to lie back down.    OT Diagnosis: Generalized weakness;Acute pain  OT Problem List: Decreased strength;Decreased activity tolerance;Impaired balance (sitting and/or standing);Decreased knowledge of use of DME or AE;Pain OT Treatment Interventions: Self-care/ADL training;DME and/or AE instruction;Therapeutic exercise;Therapeutic activities;Patient/family education;Balance training   OT Goals(Current goals can be found in the care plan section) Acute Rehab OT Goals Patient Stated Goal: pt. did not state OT Goal Formulation: With patient Time For Goal Achievement: 06/25/13 Potential to Achieve Goals: Good  Visit Information  Last OT Received On: 06/11/13 Assistance Needed: +2 History of  Present Illness: Pt. admitted with gangrene L foot and underwent BKA with removal of hardware (L tibila nail)       Prior Functioning     Home Living Family/patient expects to be discharged to:: Skilled nursing facility (Pt. states he is agreeable to rehab before home) Living Arrangements: Spouse/significant other Available Help at Discharge: Family Type of Home: House Home Access: Stairs to enter [employer] of Steps: 1 Entrance Stairs-Rails: None Home Layout: One level Home Equipment: Environmental consultant - 2 wheels;Cane - single point;Bedside commode;Shower seat Additional Comments: pt with questionable history, increased time required for report Prior Function Level of Independence: Independent with assistive device(s);Needs assistance Gait / Transfers Assistance Needed: using RW, musltiple falls Communication Communication: No difficulties;Other (comment) (slow to respond at times)         Vision/Perception     Cognition  Cognition Arousal/Alertness: Awake/alert Behavior During Therapy:  (slow to respond) Overall Cognitive Status: No family/caregiver present to determine baseline cognitive functioning    Extremity/Trunk Assessment Upper Extremity Assessment Upper Extremity Assessment: Generalized weakness Lower Extremity Assessment Lower Extremity Assessment: LLE deficits/detail LLE Deficits / Details: generalized weakness noted in residual limb with fair quad set LLE: Unable to fully assess due to pain     Mobility Bed Mobility Bed Mobility: Supine to Sit;Sitting - Scoot to Edge of Bed;Sit to Supine Supine to Sit: 1: +2 Total assist Supine to Sit: Patient Percentage: 50% Sitting - Scoot to Edge of Bed: 3: Mod assist Sit to Supine: 1: +2 Total assist;HOB flat Sit to Supine: Patient Percentage: 40% Details for Bed Mobility Assistance: cues for sequencing and technique     Exercise Amputee Exercises Quad Sets: AROM;Left;5 reps;Supine   Balance  Balance Balance Assessed: Yes  Static Sitting Balance Static Sitting - Balance Support: Bilateral upper extremity supported;Feet supported (R foot supported) Static Sitting - Level of Assistance: 5: Stand by assistance Static Sitting - Comment/# of Minutes: 5   End of Session OT - End of Session Activity Tolerance: Patient limited by fatigue;Patient limited by pain Patient left: in bed;with call bell/phone within reach;with bed alarm set  GO    06/11/2013 Cipriano Mile OTR/L Pager [phone] Office [phone]  Cipriano Mile 06/11/2013, 1:34 PM

## 2013-06-11 NOTE — Anesthesia Postprocedure Evaluation (Addendum)
  Anesthesia Post-op Note  Patient: Oscar Castillo  Procedure(s) Performed: Procedure(s) with comments: AMPUTATION BELOW KNEE (Left) - Left Below Knee Amputation and removal proximal screws IM tibial nail HARDWARE REMOVAL (Left) - Left Below Knee Amputation  and Removal proximal screws IM tibial nail  Patient Location: Nursing Unit  Anesthesia Type:General  Level of Consciousness: awake and alert   Airway and Oxygen Therapy: Patient Spontanous Breathing  Post-op Pain: none  Post-op Assessment: Cardiac and Respiratory Status Stable  Post-op Vital Signs: Reviewed and stable  Complications: No apparent anesthesia complications

## 2013-06-11 NOTE — Progress Notes (Signed)
Clinical Social Work Department BRIEF PSYCHOSOCIAL ASSESSMENT 06/11/2013  Patient:  Oscar Castillo, Oscar Castillo     Account Number:  [id number]     Admit date:  06/09/2013  Clinical Social Worker:  Hendricks Milo  Date/Time:  06/11/2013 02:32 PM  Referred by:  Physician  Date Referred:  06/11/2013 Referred for  SNF Placement   Other Referral:   Interview type:  Patient Other interview type:    PSYCHOSOCIAL DATA Living Status:  WIFE Admitted from facility:   Level of care:   Primary support name:  Georga Hacking Primary support relationship to patient:  SPOUSE Degree of support available:   Very supportive.    CURRENT CONCERNS  Other Concerns:    SOCIAL WORK ASSESSMENT / PLAN Clinical Social Worker (CSW) met with patient to discuss SNF placment. Per chart patient is oriented X4 however patient appreaed to be a little confused. He could not remmber his address. Patient was agreeable to fax out in [facility]. Patient gave CSW permission to call patient's wife. CSW left message with wife.   Assessment/plan status:  Psychosocial Support/Ongoing Assessment of Needs Other assessment/ plan:   Information/referral to community resources:   CSW gave patient SNF list.    PATIENT'S/FAMILY'S RESPONSE TO PLAN OF CARE: Patient was appreciative of CSW visit.

## 2013-06-12 ENCOUNTER — Encounter (HOSPITAL_COMMUNITY): Payer: Self-pay | Admitting: Orthopedic Surgery

## 2013-06-12 LAB — GLUCOSE, CAPILLARY
Glucose-Capillary: 119 mg/dL — ABNORMAL HIGH (ref 70–99)
Glucose-Capillary: 189 mg/dL — ABNORMAL HIGH (ref 70–99)

## 2013-06-12 MED ORDER — OXYCODONE-ACETAMINOPHEN 5-325 MG PO TABS: 1.0000 | ORAL_TABLET | ORAL | Status: DC | PRN

## 2013-06-12 NOTE — Progress Notes (Signed)
06/12/13 Spoke with patient and his wife about d/c plan, they are aware that PT is recommending SNF,patient refuses SNF. They are agreeable with HHPT, HHOT, and HHRN. They chose [facility]. Contacted Peng Thorstenson at [facility] and set up HHPT, OT and RN. Patient already has a rolling walker, wheelchair and 3N1 at home, no equipment needs identified. Ms Testerman requested that I call his dialysis center at [phone]. Contacted dialysis center and informed them that patient is to d/c home today and will need dialysis tommorrow. No other d/c needs identified. Jacquelynn Cree RN, BSN, CCM

## 2013-06-12 NOTE — Care Management Utilization Note (Signed)
Utilization review completed. Sharne Linders, RN BSN 

## 2013-06-12 NOTE — Discharge Summary (Signed)
Physician Discharge Summary  Patient ID: Oscar Castillo MRN: [id number] DOB/AGE: 02/10/1951 62 y.o.  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 *   Discharged Condition: stable  Hospital Course: Patient's hospital course was essentially unremarkable. He underwent a left transtibial amputation with removal of the intramedullary nail. Patient received dialysis postoperatively he progressed well and was discharged to skilled nursing in stable condition.  Consults: nephrology  Significant Diagnostic Studies: labs: Routine labs  Treatments: dialysis: Hemodialysis and surgery: See operative note  Discharge Exam: Blood pressure 119/58, pulse 73, temperature 98.6 F (37 C), temperature source Oral, resp. rate 18, height 5\' 10"  (1.778 m), weight 84.2 kg (185 lb 10 oz), SpO2 100.00%. Incision/Wound: dressing clean dry and intact  Disposition: 01-Home or Self Care  Discharge Orders   Future Appointments Provider Department Dept Phone   08/23/2013 3:30 PM Corwin Levins, MD [facility] -[facility] [phone]   11/29/2013 4:30 PM Corwin Levins, MD [facility] -[facility] [phone]   Future Orders Complete By Expires   Call MD / Call 911  As directed    Comments:     If you experience chest pain or shortness of breath, CALL 911 and be transported to the hospital emergency room.  If you develope a fever above 101 F, pus (white drainage) or increased drainage or redness at the wound, or calf pain, call your surgeon's office.   Constipation Prevention  As directed    Comments:     Drink plenty of fluids.  Prune juice may be helpful.  You may use a stool softener, such as Colace (over the counter) 100 mg twice a day.  Use MiraLax (over the counter) for  constipation as needed.   Diet - low sodium heart healthy  As directed    Increase activity slowly as tolerated  As directed        Medication List         acetaminophen 500 MG tablet  Commonly known as:  TYLENOL  Take 500 mg by mouth every 6 (six) hours as needed for mild pain.     amLODipine 10 MG tablet  Commonly known as:  NORVASC  Take 10 mg by mouth daily.     calcium acetate 667 MG capsule  Commonly known as:  PHOSLO  Take 2 capsules (1,334 mg total) by mouth 3 (three) times daily with meals.     docusate sodium 100 MG capsule  Commonly known as:  COLACE  Take 1 capsule (100 mg total) by mouth 2 (two) times daily.     doxycycline 100 MG capsule  Commonly known as:  VIBRAMYCIN  Take 100 mg by mouth daily.     haloperidol 5 MG tablet  Commonly known as:  HALDOL  Take 1 tablet (5 mg total) by mouth at bedtime as needed for agitation.     HYDROcodone-acetaminophen 7.5-325 MG per tablet  Commonly known as:  NORCO  Take 1 tablet by mouth every 6 (six) hours as needed for moderate pain.     labetalol 300 MG tablet  Commonly known as:  NORMODYNE  Take 1 tablet (300 mg total) by mouth 3 (three) times daily.     LANTUS SOLOSTAR [place]  Inject 5 Units into the skin every evening.     oxyCODONE-acetaminophen 5-325 MG per  tablet  Commonly known as:  ROXICET  Take 1 tablet by mouth every 4 (four) hours as needed for severe pain.     silver sulfADIAZINE 1 % cream  Commonly known as:  SILVADENE  Apply 1 application topically daily.     SIMBRINZA 1-0.2 % Susp  Generic drug:  Brinzolamide-Brimonidine  Place 1 drop into both eyes 2 (two) times daily.     Travoprost (BAK Free) 0.004 % Soln ophthalmic solution  Commonly known as:  TRAVATAN  Place 1 drop into both eyes at bedtime.     traZODone 50 MG tablet  Commonly known as:  DESYREL  Take 50 mg by mouth at bedtime as needed for sleep.           Follow-up Information   Follow up with DUDA,MARCUS V, MD In 2 weeks.    Specialty:  Orthopedic Surgery   Contact information:   [address] [place] [address] [phone]       Signed: Nadara Mustard 06/12/2013, 6:35 AM

## 2013-06-12 NOTE — Progress Notes (Signed)
Physical Therapy Treatment Patient Details Name: Oscar Castillo MRN: [id number] DOB: [birth year] Today's Date: 06/12/2013 Time: 5284-1324 PT Time Calculation (min): 19 min  PT Assessment / Plan / Recommendation  History of Present Illness Pt. admitted with gangrene L foot and underwent BKA with removal of hardware (L tibila nail)   PT Comments   Pt making progress towards physical therapy goals. Spoke with case manager after session and pt refusing SNF at this time. It is unclear how much assistance pt will have at home, and especially to enter home. 1 step to enter, and pt fatigues easily with ambulation (~5 feet with RW). Consider EMS transfer back home if pt continues to refuse d/c to SNF. If pt does not d/c today, initiate stair training next session.   Follow Up Recommendations  SNF;Supervision/Assistance - 24 hour;Supervision for mobility/OOB     Does the patient have the potential to tolerate intense rehabilitation     Barriers to Discharge Decreased caregiver support      Equipment Recommendations  None recommended by PT    Recommendations for Other Services    Frequency Min 3X/week   Progress towards PT Goals Progress towards PT goals: Progressing toward goals  Plan Current plan remains appropriate    Precautions / Restrictions Precautions Precautions: Fall Restrictions Weight Bearing Restrictions: Yes LLE Weight Bearing: Non weight bearing   Pertinent Vitals/Pain 7/10, pt requests pain meds at end of session. Nursing notified.    Mobility  Bed Mobility Bed Mobility: Supine to Sit;Sitting - Scoot to Edge of Bed Supine to Sit: HOB elevated;With rails;4: Min assist Sitting - Scoot to [place] of Bed: 4: Min guard Details for Bed Mobility Assistance: Pt reports he cannot move the residual limb, however with contact to the limb to assist with movement, pt was able to lift limb and move it to the EOB without problem. VC's for sequencing and technique to complete transfer to  EOB with assist for trunk elevation.  Transfers Transfers: Sit to Stand;Stand to Sit Sit to Stand: 3: Mod assist;From bed;With upper extremity assist Stand to Sit: 3: Mod assist;To chair/3-in-1;With upper extremity assist Details for Transfer Assistance: Mod assist to achieve full stand, as well as VC's for R foot placement on floor and hand placement prior to initiating transfers.  Ambulation/Gait Ambulation/Gait Assistance: 4: Min guard Ambulation Distance (Feet): 5 Feet Assistive device: Rolling walker Ambulation/Gait Assistance Details: Pt fatigues quickly and got somewhat agitated, stating he needs to know exactly what he is to do even after verbal instructions were given. Close chair follow required. Gait Pattern:  (2-point gait pattern with walker) Gait velocity: Decreased    Exercises     PT Diagnosis: Difficulty walking;Abnormality of gait;Acute pain;Generalized weakness  PT Problem List: Decreased strength;Decreased activity tolerance;Decreased balance;Decreased mobility;Decreased knowledge of use of DME;Decreased knowledge of precautions;Pain PT Treatment Interventions: DME instruction;Gait training;Functional mobility training;Therapeutic activities;Therapeutic exercise;Balance training;Patient/family education   PT Goals (current goals can now be found in the care plan section) Acute Rehab PT Goals Patient Stated Goal: pt. did not state PT Goal Formulation: With patient Time For Goal Achievement: 06/18/13 Potential to Achieve Goals: Fair  Visit Information  Last PT Received On: 06/12/13 Assistance Needed: +2 History of Present Illness: Pt. admitted with gangrene L foot and underwent BKA with removal of hardware (L tibila nail)    Subjective Data  Subjective: "I can't do anything, my leg was cut off." Patient Stated Goal: pt. did not state   Cognition  Cognition Arousal/Alertness: Awake/alert Behavior During  Therapy: Anxious Overall Cognitive Status: No  family/caregiver present to determine baseline cognitive functioning    Balance  Balance Balance Assessed: Yes Static Sitting Balance Static Sitting - Balance Support: Bilateral upper extremity supported;Feet supported Static Sitting - Level of Assistance: 5: Stand by assistance  End of Session PT - End of Session Equipment Utilized During Treatment: Gait belt Activity Tolerance: Patient limited by fatigue;Patient limited by pain Patient left: in chair;with call bell/phone within reach Nurse Communication: Mobility status;Patient requests pain meds   GP     Ruthann Cancer 06/12/2013, 12:42 PM  Ruthann Cancer, PT, DPT [phone]

## 2013-06-12 NOTE — Progress Notes (Signed)
Patient ID: Oscar Castillo, male   DOB: January 17, 1951, 62 y.o.   MRN: [id number] F. L2 signed. Okay for discharge to rehabilitation at this time. Discharge summary orders completed.

## 2013-06-12 NOTE — Progress Notes (Signed)
CSW met with the Pt to discuss d/c planning to a SNF and provide bed offers for facility choice.   Pt stated that he does not want to go to a SNF at this time and that he would like HHPT.   CSW informed Pt that it would be better if Pt went to a SNF for a brief period and then transition home, however the Pt still declined SNF at this time.   CSW informed CM of the Pt choice for d/c.    Leron Croak [facility]  [address];  [phone] Phone: [phone]

## 2013-06-14 DIAGNOSIS — N186 End stage renal disease: Secondary | ICD-10-CM | POA: Diagnosis not present

## 2013-06-14 DIAGNOSIS — S98119A Complete traumatic amputation of unspecified great toe, initial encounter: Secondary | ICD-10-CM | POA: Diagnosis not present

## 2013-06-14 DIAGNOSIS — I96 Gangrene, not elsewhere classified: Secondary | ICD-10-CM | POA: Diagnosis not present

## 2013-06-14 DIAGNOSIS — E1159 Type 2 diabetes mellitus with other circulatory complications: Secondary | ICD-10-CM | POA: Diagnosis not present

## 2013-06-14 DIAGNOSIS — E1129 Type 2 diabetes mellitus with other diabetic kidney complication: Secondary | ICD-10-CM | POA: Diagnosis not present

## 2013-06-14 DIAGNOSIS — I5042 Chronic combined systolic (congestive) and diastolic (congestive) heart failure: Secondary | ICD-10-CM | POA: Diagnosis not present

## 2013-06-16 DIAGNOSIS — E1129 Type 2 diabetes mellitus with other diabetic kidney complication: Secondary | ICD-10-CM | POA: Diagnosis not present

## 2013-06-16 DIAGNOSIS — E1159 Type 2 diabetes mellitus with other circulatory complications: Secondary | ICD-10-CM | POA: Diagnosis not present

## 2013-06-16 DIAGNOSIS — I96 Gangrene, not elsewhere classified: Secondary | ICD-10-CM | POA: Diagnosis not present

## 2013-06-16 DIAGNOSIS — N186 End stage renal disease: Secondary | ICD-10-CM | POA: Diagnosis not present

## 2013-06-16 DIAGNOSIS — S98119A Complete traumatic amputation of unspecified great toe, initial encounter: Secondary | ICD-10-CM | POA: Diagnosis not present

## 2013-06-16 DIAGNOSIS — I5042 Chronic combined systolic (congestive) and diastolic (congestive) heart failure: Secondary | ICD-10-CM | POA: Diagnosis not present

## 2013-06-19 DIAGNOSIS — E1159 Type 2 diabetes mellitus with other circulatory complications: Secondary | ICD-10-CM | POA: Diagnosis not present

## 2013-06-19 DIAGNOSIS — N186 End stage renal disease: Secondary | ICD-10-CM | POA: Diagnosis not present

## 2013-06-19 DIAGNOSIS — E1129 Type 2 diabetes mellitus with other diabetic kidney complication: Secondary | ICD-10-CM | POA: Diagnosis not present

## 2013-06-19 DIAGNOSIS — I96 Gangrene, not elsewhere classified: Secondary | ICD-10-CM | POA: Diagnosis not present

## 2013-06-19 DIAGNOSIS — I5042 Chronic combined systolic (congestive) and diastolic (congestive) heart failure: Secondary | ICD-10-CM | POA: Diagnosis not present

## 2013-06-19 DIAGNOSIS — S98119A Complete traumatic amputation of unspecified great toe, initial encounter: Secondary | ICD-10-CM | POA: Diagnosis not present

## 2013-06-21 DIAGNOSIS — E1159 Type 2 diabetes mellitus with other circulatory complications: Secondary | ICD-10-CM | POA: Diagnosis not present

## 2013-06-21 DIAGNOSIS — I96 Gangrene, not elsewhere classified: Secondary | ICD-10-CM | POA: Diagnosis not present

## 2013-06-21 DIAGNOSIS — I5042 Chronic combined systolic (congestive) and diastolic (congestive) heart failure: Secondary | ICD-10-CM | POA: Diagnosis not present

## 2013-06-21 DIAGNOSIS — N186 End stage renal disease: Secondary | ICD-10-CM | POA: Diagnosis not present

## 2013-06-21 DIAGNOSIS — S98119A Complete traumatic amputation of unspecified great toe, initial encounter: Secondary | ICD-10-CM | POA: Diagnosis not present

## 2013-06-21 DIAGNOSIS — E1129 Type 2 diabetes mellitus with other diabetic kidney complication: Secondary | ICD-10-CM | POA: Diagnosis not present

## 2013-06-23 DIAGNOSIS — I96 Gangrene, not elsewhere classified: Secondary | ICD-10-CM | POA: Diagnosis not present

## 2013-06-23 DIAGNOSIS — S98119A Complete traumatic amputation of unspecified great toe, initial encounter: Secondary | ICD-10-CM | POA: Diagnosis not present

## 2013-06-23 DIAGNOSIS — E1159 Type 2 diabetes mellitus with other circulatory complications: Secondary | ICD-10-CM | POA: Diagnosis not present

## 2013-06-23 DIAGNOSIS — N186 End stage renal disease: Secondary | ICD-10-CM | POA: Diagnosis not present

## 2013-06-23 DIAGNOSIS — E1129 Type 2 diabetes mellitus with other diabetic kidney complication: Secondary | ICD-10-CM | POA: Diagnosis not present

## 2013-06-23 DIAGNOSIS — I5042 Chronic combined systolic (congestive) and diastolic (congestive) heart failure: Secondary | ICD-10-CM | POA: Diagnosis not present

## 2013-06-25 DIAGNOSIS — N186 End stage renal disease: Secondary | ICD-10-CM | POA: Diagnosis not present

## 2013-06-26 DIAGNOSIS — E1129 Type 2 diabetes mellitus with other diabetic kidney complication: Secondary | ICD-10-CM | POA: Diagnosis not present

## 2013-06-26 DIAGNOSIS — I5042 Chronic combined systolic (congestive) and diastolic (congestive) heart failure: Secondary | ICD-10-CM | POA: Diagnosis not present

## 2013-06-26 DIAGNOSIS — E1159 Type 2 diabetes mellitus with other circulatory complications: Secondary | ICD-10-CM | POA: Diagnosis not present

## 2013-06-26 DIAGNOSIS — N186 End stage renal disease: Secondary | ICD-10-CM | POA: Diagnosis not present

## 2013-06-26 DIAGNOSIS — I96 Gangrene, not elsewhere classified: Secondary | ICD-10-CM | POA: Diagnosis not present

## 2013-06-26 DIAGNOSIS — S98119A Complete traumatic amputation of unspecified great toe, initial encounter: Secondary | ICD-10-CM | POA: Diagnosis not present

## 2013-06-27 DIAGNOSIS — Z992 Dependence on renal dialysis: Secondary | ICD-10-CM | POA: Diagnosis not present

## 2013-06-27 DIAGNOSIS — N186 End stage renal disease: Secondary | ICD-10-CM | POA: Diagnosis not present

## 2013-06-27 DIAGNOSIS — D509 Iron deficiency anemia, unspecified: Secondary | ICD-10-CM | POA: Diagnosis not present

## 2013-06-27 DIAGNOSIS — D631 Anemia in chronic kidney disease: Secondary | ICD-10-CM | POA: Diagnosis not present

## 2013-06-27 DIAGNOSIS — N2581 Secondary hyperparathyroidism of renal origin: Secondary | ICD-10-CM | POA: Diagnosis not present

## 2013-06-28 DIAGNOSIS — N186 End stage renal disease: Secondary | ICD-10-CM | POA: Diagnosis not present

## 2013-06-28 DIAGNOSIS — E1129 Type 2 diabetes mellitus with other diabetic kidney complication: Secondary | ICD-10-CM | POA: Diagnosis not present

## 2013-06-28 DIAGNOSIS — E1159 Type 2 diabetes mellitus with other circulatory complications: Secondary | ICD-10-CM | POA: Diagnosis not present

## 2013-06-28 DIAGNOSIS — I5042 Chronic combined systolic (congestive) and diastolic (congestive) heart failure: Secondary | ICD-10-CM | POA: Diagnosis not present

## 2013-06-28 DIAGNOSIS — I96 Gangrene, not elsewhere classified: Secondary | ICD-10-CM | POA: Diagnosis not present

## 2013-06-28 DIAGNOSIS — S98119A Complete traumatic amputation of unspecified great toe, initial encounter: Secondary | ICD-10-CM | POA: Diagnosis not present

## 2013-06-29 DIAGNOSIS — D509 Iron deficiency anemia, unspecified: Secondary | ICD-10-CM | POA: Diagnosis not present

## 2013-06-29 DIAGNOSIS — D631 Anemia in chronic kidney disease: Secondary | ICD-10-CM | POA: Diagnosis not present

## 2013-06-29 DIAGNOSIS — N2581 Secondary hyperparathyroidism of renal origin: Secondary | ICD-10-CM | POA: Diagnosis not present

## 2013-06-29 DIAGNOSIS — N186 End stage renal disease: Secondary | ICD-10-CM | POA: Diagnosis not present

## 2013-06-29 DIAGNOSIS — Z992 Dependence on renal dialysis: Secondary | ICD-10-CM | POA: Diagnosis not present

## 2013-06-30 DIAGNOSIS — E1129 Type 2 diabetes mellitus with other diabetic kidney complication: Secondary | ICD-10-CM | POA: Diagnosis not present

## 2013-06-30 DIAGNOSIS — I96 Gangrene, not elsewhere classified: Secondary | ICD-10-CM | POA: Diagnosis not present

## 2013-06-30 DIAGNOSIS — S98119A Complete traumatic amputation of unspecified great toe, initial encounter: Secondary | ICD-10-CM | POA: Diagnosis not present

## 2013-06-30 DIAGNOSIS — E1159 Type 2 diabetes mellitus with other circulatory complications: Secondary | ICD-10-CM | POA: Diagnosis not present

## 2013-06-30 DIAGNOSIS — N186 End stage renal disease: Secondary | ICD-10-CM | POA: Diagnosis not present

## 2013-06-30 DIAGNOSIS — I5042 Chronic combined systolic (congestive) and diastolic (congestive) heart failure: Secondary | ICD-10-CM | POA: Diagnosis not present

## 2013-07-01 DIAGNOSIS — N2581 Secondary hyperparathyroidism of renal origin: Secondary | ICD-10-CM | POA: Diagnosis not present

## 2013-07-01 DIAGNOSIS — D509 Iron deficiency anemia, unspecified: Secondary | ICD-10-CM | POA: Diagnosis not present

## 2013-07-01 DIAGNOSIS — Z992 Dependence on renal dialysis: Secondary | ICD-10-CM | POA: Diagnosis not present

## 2013-07-01 DIAGNOSIS — N186 End stage renal disease: Secondary | ICD-10-CM | POA: Diagnosis not present

## 2013-07-01 DIAGNOSIS — D631 Anemia in chronic kidney disease: Secondary | ICD-10-CM | POA: Diagnosis not present

## 2013-07-03 DIAGNOSIS — I96 Gangrene, not elsewhere classified: Secondary | ICD-10-CM | POA: Diagnosis not present

## 2013-07-03 DIAGNOSIS — E1129 Type 2 diabetes mellitus with other diabetic kidney complication: Secondary | ICD-10-CM | POA: Diagnosis not present

## 2013-07-03 DIAGNOSIS — E1159 Type 2 diabetes mellitus with other circulatory complications: Secondary | ICD-10-CM | POA: Diagnosis not present

## 2013-07-03 DIAGNOSIS — N186 End stage renal disease: Secondary | ICD-10-CM | POA: Diagnosis not present

## 2013-07-03 DIAGNOSIS — I5042 Chronic combined systolic (congestive) and diastolic (congestive) heart failure: Secondary | ICD-10-CM | POA: Diagnosis not present

## 2013-07-03 DIAGNOSIS — S98119A Complete traumatic amputation of unspecified great toe, initial encounter: Secondary | ICD-10-CM | POA: Diagnosis not present

## 2013-07-04 DIAGNOSIS — D509 Iron deficiency anemia, unspecified: Secondary | ICD-10-CM | POA: Diagnosis not present

## 2013-07-04 DIAGNOSIS — Z992 Dependence on renal dialysis: Secondary | ICD-10-CM | POA: Diagnosis not present

## 2013-07-04 DIAGNOSIS — D631 Anemia in chronic kidney disease: Secondary | ICD-10-CM | POA: Diagnosis not present

## 2013-07-04 DIAGNOSIS — N2581 Secondary hyperparathyroidism of renal origin: Secondary | ICD-10-CM | POA: Diagnosis not present

## 2013-07-04 DIAGNOSIS — N186 End stage renal disease: Secondary | ICD-10-CM | POA: Diagnosis not present

## 2013-07-05 DIAGNOSIS — S98119A Complete traumatic amputation of unspecified great toe, initial encounter: Secondary | ICD-10-CM | POA: Diagnosis not present

## 2013-07-05 DIAGNOSIS — I96 Gangrene, not elsewhere classified: Secondary | ICD-10-CM | POA: Diagnosis not present

## 2013-07-05 DIAGNOSIS — I5042 Chronic combined systolic (congestive) and diastolic (congestive) heart failure: Secondary | ICD-10-CM | POA: Diagnosis not present

## 2013-07-05 DIAGNOSIS — N186 End stage renal disease: Secondary | ICD-10-CM | POA: Diagnosis not present

## 2013-07-05 DIAGNOSIS — E1129 Type 2 diabetes mellitus with other diabetic kidney complication: Secondary | ICD-10-CM | POA: Diagnosis not present

## 2013-07-05 DIAGNOSIS — E1159 Type 2 diabetes mellitus with other circulatory complications: Secondary | ICD-10-CM | POA: Diagnosis not present

## 2013-07-06 DIAGNOSIS — N2581 Secondary hyperparathyroidism of renal origin: Secondary | ICD-10-CM | POA: Diagnosis not present

## 2013-07-06 DIAGNOSIS — D509 Iron deficiency anemia, unspecified: Secondary | ICD-10-CM | POA: Diagnosis not present

## 2013-07-06 DIAGNOSIS — D631 Anemia in chronic kidney disease: Secondary | ICD-10-CM | POA: Diagnosis not present

## 2013-07-06 DIAGNOSIS — N186 End stage renal disease: Secondary | ICD-10-CM | POA: Diagnosis not present

## 2013-07-06 DIAGNOSIS — H4011X0 Primary open-angle glaucoma, stage unspecified: Secondary | ICD-10-CM | POA: Diagnosis not present

## 2013-07-06 DIAGNOSIS — Z992 Dependence on renal dialysis: Secondary | ICD-10-CM | POA: Diagnosis not present

## 2013-07-06 DIAGNOSIS — H409 Unspecified glaucoma: Secondary | ICD-10-CM | POA: Diagnosis not present

## 2013-07-07 DIAGNOSIS — S98119A Complete traumatic amputation of unspecified great toe, initial encounter: Secondary | ICD-10-CM | POA: Diagnosis not present

## 2013-07-07 DIAGNOSIS — N186 End stage renal disease: Secondary | ICD-10-CM | POA: Diagnosis not present

## 2013-07-07 DIAGNOSIS — I5042 Chronic combined systolic (congestive) and diastolic (congestive) heart failure: Secondary | ICD-10-CM | POA: Diagnosis not present

## 2013-07-07 DIAGNOSIS — I96 Gangrene, not elsewhere classified: Secondary | ICD-10-CM | POA: Diagnosis not present

## 2013-07-07 DIAGNOSIS — E1129 Type 2 diabetes mellitus with other diabetic kidney complication: Secondary | ICD-10-CM | POA: Diagnosis not present

## 2013-07-07 DIAGNOSIS — E1159 Type 2 diabetes mellitus with other circulatory complications: Secondary | ICD-10-CM | POA: Diagnosis not present

## 2013-07-08 DIAGNOSIS — N2581 Secondary hyperparathyroidism of renal origin: Secondary | ICD-10-CM | POA: Diagnosis not present

## 2013-07-08 DIAGNOSIS — D631 Anemia in chronic kidney disease: Secondary | ICD-10-CM | POA: Diagnosis not present

## 2013-07-08 DIAGNOSIS — D509 Iron deficiency anemia, unspecified: Secondary | ICD-10-CM | POA: Diagnosis not present

## 2013-07-08 DIAGNOSIS — N186 End stage renal disease: Secondary | ICD-10-CM | POA: Diagnosis not present

## 2013-07-08 DIAGNOSIS — Z992 Dependence on renal dialysis: Secondary | ICD-10-CM | POA: Diagnosis not present

## 2013-07-10 DIAGNOSIS — I96 Gangrene, not elsewhere classified: Secondary | ICD-10-CM | POA: Diagnosis not present

## 2013-07-10 DIAGNOSIS — E1159 Type 2 diabetes mellitus with other circulatory complications: Secondary | ICD-10-CM | POA: Diagnosis not present

## 2013-07-10 DIAGNOSIS — E1129 Type 2 diabetes mellitus with other diabetic kidney complication: Secondary | ICD-10-CM | POA: Diagnosis not present

## 2013-07-10 DIAGNOSIS — N186 End stage renal disease: Secondary | ICD-10-CM | POA: Diagnosis not present

## 2013-07-10 DIAGNOSIS — I5042 Chronic combined systolic (congestive) and diastolic (congestive) heart failure: Secondary | ICD-10-CM | POA: Diagnosis not present

## 2013-07-10 DIAGNOSIS — S98119A Complete traumatic amputation of unspecified great toe, initial encounter: Secondary | ICD-10-CM | POA: Diagnosis not present

## 2013-07-11 DIAGNOSIS — D631 Anemia in chronic kidney disease: Secondary | ICD-10-CM | POA: Diagnosis not present

## 2013-07-11 DIAGNOSIS — N186 End stage renal disease: Secondary | ICD-10-CM | POA: Diagnosis not present

## 2013-07-11 DIAGNOSIS — D509 Iron deficiency anemia, unspecified: Secondary | ICD-10-CM | POA: Diagnosis not present

## 2013-07-11 DIAGNOSIS — Z992 Dependence on renal dialysis: Secondary | ICD-10-CM | POA: Diagnosis not present

## 2013-07-11 DIAGNOSIS — N2581 Secondary hyperparathyroidism of renal origin: Secondary | ICD-10-CM | POA: Diagnosis not present

## 2013-07-12 DIAGNOSIS — E1129 Type 2 diabetes mellitus with other diabetic kidney complication: Secondary | ICD-10-CM | POA: Diagnosis not present

## 2013-07-12 DIAGNOSIS — S98119A Complete traumatic amputation of unspecified great toe, initial encounter: Secondary | ICD-10-CM | POA: Diagnosis not present

## 2013-07-12 DIAGNOSIS — I5042 Chronic combined systolic (congestive) and diastolic (congestive) heart failure: Secondary | ICD-10-CM | POA: Diagnosis not present

## 2013-07-12 DIAGNOSIS — I96 Gangrene, not elsewhere classified: Secondary | ICD-10-CM | POA: Diagnosis not present

## 2013-07-12 DIAGNOSIS — N186 End stage renal disease: Secondary | ICD-10-CM | POA: Diagnosis not present

## 2013-07-12 DIAGNOSIS — E1159 Type 2 diabetes mellitus with other circulatory complications: Secondary | ICD-10-CM | POA: Diagnosis not present

## 2013-07-13 DIAGNOSIS — Z992 Dependence on renal dialysis: Secondary | ICD-10-CM | POA: Diagnosis not present

## 2013-07-13 DIAGNOSIS — D509 Iron deficiency anemia, unspecified: Secondary | ICD-10-CM | POA: Diagnosis not present

## 2013-07-13 DIAGNOSIS — N186 End stage renal disease: Secondary | ICD-10-CM | POA: Diagnosis not present

## 2013-07-13 DIAGNOSIS — D631 Anemia in chronic kidney disease: Secondary | ICD-10-CM | POA: Diagnosis not present

## 2013-07-13 DIAGNOSIS — N2581 Secondary hyperparathyroidism of renal origin: Secondary | ICD-10-CM | POA: Diagnosis not present

## 2013-07-14 DIAGNOSIS — I96 Gangrene, not elsewhere classified: Secondary | ICD-10-CM | POA: Diagnosis not present

## 2013-07-14 DIAGNOSIS — B351 Tinea unguium: Secondary | ICD-10-CM | POA: Diagnosis not present

## 2013-07-14 DIAGNOSIS — I70269 Atherosclerosis of native arteries of extremities with gangrene, unspecified extremity: Secondary | ICD-10-CM | POA: Diagnosis not present

## 2013-07-14 DIAGNOSIS — E1159 Type 2 diabetes mellitus with other circulatory complications: Secondary | ICD-10-CM | POA: Diagnosis not present

## 2013-07-14 DIAGNOSIS — L97409 Non-pressure chronic ulcer of unspecified heel and midfoot with unspecified severity: Secondary | ICD-10-CM | POA: Diagnosis not present

## 2013-07-14 DIAGNOSIS — E1129 Type 2 diabetes mellitus with other diabetic kidney complication: Secondary | ICD-10-CM | POA: Diagnosis not present

## 2013-07-14 DIAGNOSIS — I5042 Chronic combined systolic (congestive) and diastolic (congestive) heart failure: Secondary | ICD-10-CM | POA: Diagnosis not present

## 2013-07-14 DIAGNOSIS — S98119A Complete traumatic amputation of unspecified great toe, initial encounter: Secondary | ICD-10-CM | POA: Diagnosis not present

## 2013-07-14 DIAGNOSIS — N186 End stage renal disease: Secondary | ICD-10-CM | POA: Diagnosis not present

## 2013-07-15 DIAGNOSIS — N2581 Secondary hyperparathyroidism of renal origin: Secondary | ICD-10-CM | POA: Diagnosis not present

## 2013-07-15 DIAGNOSIS — D631 Anemia in chronic kidney disease: Secondary | ICD-10-CM | POA: Diagnosis not present

## 2013-07-15 DIAGNOSIS — Z992 Dependence on renal dialysis: Secondary | ICD-10-CM | POA: Diagnosis not present

## 2013-07-15 DIAGNOSIS — D509 Iron deficiency anemia, unspecified: Secondary | ICD-10-CM | POA: Diagnosis not present

## 2013-07-15 DIAGNOSIS — N186 End stage renal disease: Secondary | ICD-10-CM | POA: Diagnosis not present

## 2013-07-17 DIAGNOSIS — Z992 Dependence on renal dialysis: Secondary | ICD-10-CM | POA: Diagnosis not present

## 2013-07-17 DIAGNOSIS — D509 Iron deficiency anemia, unspecified: Secondary | ICD-10-CM | POA: Diagnosis not present

## 2013-07-17 DIAGNOSIS — N186 End stage renal disease: Secondary | ICD-10-CM | POA: Diagnosis not present

## 2013-07-17 DIAGNOSIS — N2581 Secondary hyperparathyroidism of renal origin: Secondary | ICD-10-CM | POA: Diagnosis not present

## 2013-07-17 DIAGNOSIS — D631 Anemia in chronic kidney disease: Secondary | ICD-10-CM | POA: Diagnosis not present

## 2013-07-18 DIAGNOSIS — H25049 Posterior subcapsular polar age-related cataract, unspecified eye: Secondary | ICD-10-CM | POA: Diagnosis not present

## 2013-07-18 DIAGNOSIS — H2510 Age-related nuclear cataract, unspecified eye: Secondary | ICD-10-CM | POA: Diagnosis not present

## 2013-07-19 DIAGNOSIS — N186 End stage renal disease: Secondary | ICD-10-CM | POA: Diagnosis not present

## 2013-07-19 DIAGNOSIS — D631 Anemia in chronic kidney disease: Secondary | ICD-10-CM | POA: Diagnosis not present

## 2013-07-19 DIAGNOSIS — D509 Iron deficiency anemia, unspecified: Secondary | ICD-10-CM | POA: Diagnosis not present

## 2013-07-19 DIAGNOSIS — Z992 Dependence on renal dialysis: Secondary | ICD-10-CM | POA: Diagnosis not present

## 2013-07-19 DIAGNOSIS — N2581 Secondary hyperparathyroidism of renal origin: Secondary | ICD-10-CM | POA: Diagnosis not present

## 2013-07-22 DIAGNOSIS — N186 End stage renal disease: Secondary | ICD-10-CM | POA: Diagnosis not present

## 2013-07-22 DIAGNOSIS — D509 Iron deficiency anemia, unspecified: Secondary | ICD-10-CM | POA: Diagnosis not present

## 2013-07-22 DIAGNOSIS — N2581 Secondary hyperparathyroidism of renal origin: Secondary | ICD-10-CM | POA: Diagnosis not present

## 2013-07-22 DIAGNOSIS — Z992 Dependence on renal dialysis: Secondary | ICD-10-CM | POA: Diagnosis not present

## 2013-07-22 DIAGNOSIS — D631 Anemia in chronic kidney disease: Secondary | ICD-10-CM | POA: Diagnosis not present

## 2013-07-24 DIAGNOSIS — Z992 Dependence on renal dialysis: Secondary | ICD-10-CM | POA: Diagnosis not present

## 2013-07-24 DIAGNOSIS — D509 Iron deficiency anemia, unspecified: Secondary | ICD-10-CM | POA: Diagnosis not present

## 2013-07-24 DIAGNOSIS — N2581 Secondary hyperparathyroidism of renal origin: Secondary | ICD-10-CM | POA: Diagnosis not present

## 2013-07-24 DIAGNOSIS — D631 Anemia in chronic kidney disease: Secondary | ICD-10-CM | POA: Diagnosis not present

## 2013-07-24 DIAGNOSIS — N186 End stage renal disease: Secondary | ICD-10-CM | POA: Diagnosis not present

## 2013-07-26 DIAGNOSIS — N2581 Secondary hyperparathyroidism of renal origin: Secondary | ICD-10-CM | POA: Diagnosis not present

## 2013-07-26 DIAGNOSIS — Z992 Dependence on renal dialysis: Secondary | ICD-10-CM | POA: Diagnosis not present

## 2013-07-26 DIAGNOSIS — N186 End stage renal disease: Secondary | ICD-10-CM | POA: Diagnosis not present

## 2013-07-26 DIAGNOSIS — D509 Iron deficiency anemia, unspecified: Secondary | ICD-10-CM | POA: Diagnosis not present

## 2013-07-26 DIAGNOSIS — D631 Anemia in chronic kidney disease: Secondary | ICD-10-CM | POA: Diagnosis not present

## 2013-07-29 DIAGNOSIS — D631 Anemia in chronic kidney disease: Secondary | ICD-10-CM | POA: Diagnosis not present

## 2013-07-29 DIAGNOSIS — Z992 Dependence on renal dialysis: Secondary | ICD-10-CM | POA: Diagnosis not present

## 2013-07-29 DIAGNOSIS — N186 End stage renal disease: Secondary | ICD-10-CM | POA: Diagnosis not present

## 2013-07-29 DIAGNOSIS — N2581 Secondary hyperparathyroidism of renal origin: Secondary | ICD-10-CM | POA: Diagnosis not present

## 2013-07-29 DIAGNOSIS — D509 Iron deficiency anemia, unspecified: Secondary | ICD-10-CM | POA: Diagnosis not present

## 2013-08-04 DIAGNOSIS — N186 End stage renal disease: Secondary | ICD-10-CM | POA: Diagnosis not present

## 2013-08-04 DIAGNOSIS — I871 Compression of vein: Secondary | ICD-10-CM | POA: Diagnosis not present

## 2013-08-04 DIAGNOSIS — T82898A Other specified complication of vascular prosthetic devices, implants and grafts, initial encounter: Secondary | ICD-10-CM | POA: Diagnosis not present

## 2013-08-23 ENCOUNTER — Ambulatory Visit: Payer: Medicare Other | Admitting: Internal Medicine

## 2013-08-24 DIAGNOSIS — N186 End stage renal disease: Secondary | ICD-10-CM | POA: Diagnosis not present

## 2013-08-24 DIAGNOSIS — E1129 Type 2 diabetes mellitus with other diabetic kidney complication: Secondary | ICD-10-CM | POA: Diagnosis not present

## 2013-08-26 DIAGNOSIS — N186 End stage renal disease: Secondary | ICD-10-CM | POA: Diagnosis not present

## 2013-08-29 DIAGNOSIS — N2581 Secondary hyperparathyroidism of renal origin: Secondary | ICD-10-CM | POA: Diagnosis not present

## 2013-08-29 DIAGNOSIS — D631 Anemia in chronic kidney disease: Secondary | ICD-10-CM | POA: Diagnosis not present

## 2013-08-29 DIAGNOSIS — Z992 Dependence on renal dialysis: Secondary | ICD-10-CM | POA: Diagnosis not present

## 2013-08-29 DIAGNOSIS — D509 Iron deficiency anemia, unspecified: Secondary | ICD-10-CM | POA: Diagnosis not present

## 2013-08-29 DIAGNOSIS — N186 End stage renal disease: Secondary | ICD-10-CM | POA: Diagnosis not present

## 2013-09-01 DIAGNOSIS — T82898A Other specified complication of vascular prosthetic devices, implants and grafts, initial encounter: Secondary | ICD-10-CM | POA: Diagnosis not present

## 2013-09-01 DIAGNOSIS — I871 Compression of vein: Secondary | ICD-10-CM | POA: Diagnosis not present

## 2013-09-01 DIAGNOSIS — N186 End stage renal disease: Secondary | ICD-10-CM | POA: Diagnosis not present

## 2013-09-07 ENCOUNTER — Other Ambulatory Visit (HOSPITAL_COMMUNITY): Payer: Self-pay | Admitting: *Deleted

## 2013-09-07 ENCOUNTER — Encounter (HOSPITAL_COMMUNITY): Payer: Self-pay | Admitting: *Deleted

## 2013-09-07 ENCOUNTER — Other Ambulatory Visit (HOSPITAL_COMMUNITY): Payer: Self-pay | Admitting: Orthopedic Surgery

## 2013-09-08 ENCOUNTER — Inpatient Hospital Stay (HOSPITAL_COMMUNITY)
Admission: RE | Admit: 2013-09-08 | Discharge: 2013-09-11 | DRG: 239 | Disposition: A | Discharge status: Skilled Nursing Facility | Payer: Medicare Other | Source: Ambulatory Visit | Attending: Orthopedic Surgery | Admitting: Orthopedic Surgery

## 2013-09-08 ENCOUNTER — Inpatient Hospital Stay (HOSPITAL_COMMUNITY): Payer: Medicare Other | Admitting: Anesthesiology

## 2013-09-08 ENCOUNTER — Encounter (HOSPITAL_COMMUNITY)
Admission: RE | Disposition: A | Discharge status: Skilled Nursing Facility | Payer: Self-pay | Source: Ambulatory Visit | Attending: Orthopedic Surgery

## 2013-09-08 ENCOUNTER — Encounter (HOSPITAL_COMMUNITY): Payer: Medicare Other | Admitting: Anesthesiology

## 2013-09-08 ENCOUNTER — Encounter (HOSPITAL_COMMUNITY): Payer: Self-pay | Admitting: *Deleted

## 2013-09-08 DIAGNOSIS — N400 Enlarged prostate without lower urinary tract symptoms: Secondary | ICD-10-CM | POA: Diagnosis present

## 2013-09-08 DIAGNOSIS — Z79899 Other long term (current) drug therapy: Secondary | ICD-10-CM | POA: Diagnosis not present

## 2013-09-08 DIAGNOSIS — L97409 Non-pressure chronic ulcer of unspecified heel and midfoot with unspecified severity: Secondary | ICD-10-CM | POA: Diagnosis not present

## 2013-09-08 DIAGNOSIS — R41841 Cognitive communication deficit: Secondary | ICD-10-CM | POA: Diagnosis not present

## 2013-09-08 DIAGNOSIS — Z4789 Encounter for other orthopedic aftercare: Secondary | ICD-10-CM | POA: Diagnosis not present

## 2013-09-08 DIAGNOSIS — M6281 Muscle weakness (generalized): Secondary | ICD-10-CM | POA: Diagnosis not present

## 2013-09-08 DIAGNOSIS — M86179 Other acute osteomyelitis, unspecified ankle and foot: Secondary | ICD-10-CM | POA: Diagnosis not present

## 2013-09-08 DIAGNOSIS — Z833 Family history of diabetes mellitus: Secondary | ICD-10-CM

## 2013-09-08 DIAGNOSIS — F3289 Other specified depressive episodes: Secondary | ICD-10-CM | POA: Diagnosis present

## 2013-09-08 DIAGNOSIS — I509 Heart failure, unspecified: Secondary | ICD-10-CM | POA: Diagnosis present

## 2013-09-08 DIAGNOSIS — N2581 Secondary hyperparathyroidism of renal origin: Secondary | ICD-10-CM | POA: Diagnosis present

## 2013-09-08 DIAGNOSIS — I70269 Atherosclerosis of native arteries of extremities with gangrene, unspecified extremity: Secondary | ICD-10-CM | POA: Diagnosis present

## 2013-09-08 DIAGNOSIS — E1129 Type 2 diabetes mellitus with other diabetic kidney complication: Secondary | ICD-10-CM | POA: Diagnosis present

## 2013-09-08 DIAGNOSIS — D631 Anemia in chronic kidney disease: Secondary | ICD-10-CM | POA: Diagnosis not present

## 2013-09-08 DIAGNOSIS — M549 Dorsalgia, unspecified: Secondary | ICD-10-CM | POA: Diagnosis not present

## 2013-09-08 DIAGNOSIS — Z992 Dependence on renal dialysis: Secondary | ICD-10-CM

## 2013-09-08 DIAGNOSIS — N186 End stage renal disease: Secondary | ICD-10-CM | POA: Diagnosis present

## 2013-09-08 DIAGNOSIS — Z89512 Acquired absence of left leg below knee: Secondary | ICD-10-CM

## 2013-09-08 DIAGNOSIS — Z8249 Family history of ischemic heart disease and other diseases of the circulatory system: Secondary | ICD-10-CM | POA: Diagnosis not present

## 2013-09-08 DIAGNOSIS — E1159 Type 2 diabetes mellitus with other circulatory complications: Secondary | ICD-10-CM | POA: Diagnosis present

## 2013-09-08 DIAGNOSIS — I10 Essential (primary) hypertension: Secondary | ICD-10-CM | POA: Diagnosis not present

## 2013-09-08 DIAGNOSIS — Z905 Acquired absence of kidney: Secondary | ICD-10-CM

## 2013-09-08 DIAGNOSIS — Z8673 Personal history of transient ischemic attack (TIA), and cerebral infarction without residual deficits: Secondary | ICD-10-CM

## 2013-09-08 DIAGNOSIS — B1920 Unspecified viral hepatitis C without hepatic coma: Secondary | ICD-10-CM | POA: Diagnosis present

## 2013-09-08 DIAGNOSIS — N200 Calculus of kidney: Secondary | ICD-10-CM | POA: Diagnosis not present

## 2013-09-08 DIAGNOSIS — E1149 Type 2 diabetes mellitus with other diabetic neurological complication: Secondary | ICD-10-CM | POA: Diagnosis not present

## 2013-09-08 DIAGNOSIS — Z87442 Personal history of urinary calculi: Secondary | ICD-10-CM | POA: Diagnosis not present

## 2013-09-08 DIAGNOSIS — K219 Gastro-esophageal reflux disease without esophagitis: Secondary | ICD-10-CM | POA: Diagnosis present

## 2013-09-08 DIAGNOSIS — Z794 Long term (current) use of insulin: Secondary | ICD-10-CM | POA: Diagnosis not present

## 2013-09-08 DIAGNOSIS — R279 Unspecified lack of coordination: Secondary | ICD-10-CM | POA: Diagnosis not present

## 2013-09-08 DIAGNOSIS — I120 Hypertensive chronic kidney disease with stage 5 chronic kidney disease or end stage renal disease: Secondary | ICD-10-CM | POA: Diagnosis present

## 2013-09-08 DIAGNOSIS — R1312 Dysphagia, oropharyngeal phase: Secondary | ICD-10-CM | POA: Diagnosis not present

## 2013-09-08 DIAGNOSIS — I96 Gangrene, not elsewhere classified: Secondary | ICD-10-CM | POA: Diagnosis not present

## 2013-09-08 DIAGNOSIS — N039 Chronic nephritic syndrome with unspecified morphologic changes: Secondary | ICD-10-CM

## 2013-09-08 DIAGNOSIS — S88119A Complete traumatic amputation at level between knee and ankle, unspecified lower leg, initial encounter: Secondary | ICD-10-CM

## 2013-09-08 DIAGNOSIS — F329 Major depressive disorder, single episode, unspecified: Secondary | ICD-10-CM | POA: Diagnosis present

## 2013-09-08 DIAGNOSIS — E119 Type 2 diabetes mellitus without complications: Secondary | ICD-10-CM | POA: Diagnosis not present

## 2013-09-08 DIAGNOSIS — E1165 Type 2 diabetes mellitus with hyperglycemia: Secondary | ICD-10-CM | POA: Diagnosis not present

## 2013-09-08 DIAGNOSIS — R259 Unspecified abnormal involuntary movements: Secondary | ICD-10-CM | POA: Diagnosis present

## 2013-09-08 DIAGNOSIS — M79609 Pain in unspecified limb: Secondary | ICD-10-CM | POA: Diagnosis not present

## 2013-09-08 DIAGNOSIS — Z89511 Acquired absence of right leg below knee: Secondary | ICD-10-CM

## 2013-09-08 DIAGNOSIS — I739 Peripheral vascular disease, unspecified: Secondary | ICD-10-CM | POA: Diagnosis not present

## 2013-09-08 DIAGNOSIS — L97909 Non-pressure chronic ulcer of unspecified part of unspecified lower leg with unspecified severity: Secondary | ICD-10-CM | POA: Diagnosis not present

## 2013-09-08 HISTORY — DX: Other complications of anesthesia, initial encounter: T88.59XA

## 2013-09-08 HISTORY — PX: AMPUTATION: SHX166

## 2013-09-08 HISTORY — DX: Adverse effect of unspecified anesthetic, initial encounter: T41.45XA

## 2013-09-08 LAB — COMPREHENSIVE METABOLIC PANEL
ALK PHOS: 88 U/L (ref 39–117)
ALT: 8 U/L (ref 0–53)
AST: 14 U/L (ref 0–37)
Albumin: 3.3 g/dL — ABNORMAL LOW (ref 3.5–5.2)
BUN: 29 mg/dL — ABNORMAL HIGH (ref 6–23)
CO2: 27 mEq/L (ref 19–32)
Calcium: 10.4 mg/dL (ref 8.4–10.5)
Chloride: 94 mEq/L — ABNORMAL LOW (ref 96–112)
Creatinine, Ser: 8.2 mg/dL — ABNORMAL HIGH (ref 0.50–1.35)
GFR calc Af Amer: 7 mL/min — ABNORMAL LOW (ref >90)
GFR, EST NON AFRICAN AMERICAN: 6 mL/min — AB (ref >90)
Glucose, Bld: 181 mg/dL — ABNORMAL HIGH (ref 70–99)
POTASSIUM: 4.2 meq/L (ref 3.7–5.3)
SODIUM: 140 meq/L (ref 137–147)
TOTAL PROTEIN: 9.5 g/dL — AB (ref 6.0–8.3)
Total Bilirubin: 0.4 mg/dL (ref 0.3–1.2)

## 2013-09-08 LAB — CBC
HCT: 36.6 % — ABNORMAL LOW (ref 39.0–52.0)
Hemoglobin: 12.3 g/dL — ABNORMAL LOW (ref 13.0–17.0)
MCH: 28.3 pg (ref 26.0–34.0)
MCHC: 33.6 g/dL (ref 30.0–36.0)
MCV: 84.3 fL (ref 78.0–100.0)
Platelets: 319 10*3/uL (ref 150–400)
RBC: 4.34 MIL/uL (ref 4.22–5.81)
RDW: 17.9 % — ABNORMAL HIGH (ref 11.5–15.5)
WBC: 9.4 10*3/uL (ref 4.0–10.5)

## 2013-09-08 LAB — GLUCOSE, CAPILLARY
GLUCOSE-CAPILLARY: 129 mg/dL — AB (ref 70–99)
Glucose-Capillary: 200 mg/dL — ABNORMAL HIGH (ref 70–99)
Glucose-Capillary: 137 mg/dL — ABNORMAL HIGH (ref 70–99)
Glucose-Capillary: 142 mg/dL — ABNORMAL HIGH (ref 70–99)
Glucose-Capillary: 188 mg/dL — ABNORMAL HIGH (ref 70–99)

## 2013-09-08 LAB — PROTIME-INR
INR: 1.11 (ref 0.00–1.49)
Prothrombin Time: 14.1 seconds (ref 11.6–15.2)

## 2013-09-08 LAB — APTT: aPTT: 39 seconds — ABNORMAL HIGH (ref 24–37)

## 2013-09-08 SURGERY — AMPUTATION BELOW KNEE
Anesthesia: General | Laterality: Right

## 2013-09-08 MED ORDER — SERTRALINE HCL 100 MG PO TABS
100.0000 mg | ORAL_TABLET | Freq: Every day | ORAL | Status: DC | PRN
  Filled 2013-09-08: qty 1

## 2013-09-08 MED ORDER — PROPOFOL 10 MG/ML IV BOLUS
INTRAVENOUS | Status: AC
  Filled 2013-09-08: qty 20

## 2013-09-08 MED ORDER — CEFAZOLIN SODIUM 1-5 GM-% IV SOLN
1.0000 g | Freq: Four times a day (QID) | INTRAVENOUS | Status: AC
  Administered 2013-09-08 – 2013-09-09 (×2): 1 g via INTRAVENOUS
  Filled 2013-09-08 (×4): qty 50

## 2013-09-08 MED ORDER — CEFAZOLIN SODIUM-DEXTROSE 2-3 GM-% IV SOLR
2.0000 g | INTRAVENOUS | Status: AC
Start: 2013-09-08 — End: 2013-09-08
  Administered 2013-09-08: 2 g via INTRAVENOUS

## 2013-09-08 MED ORDER — LABETALOL HCL 300 MG PO TABS
300.0000 mg | ORAL_TABLET | Freq: Three times a day (TID) | ORAL | Status: DC
  Administered 2013-09-08 – 2013-09-11 (×5): 300 mg via ORAL
  Filled 2013-09-08 (×10): qty 1

## 2013-09-08 MED ORDER — TRAZODONE HCL 50 MG PO TABS
50.0000 mg | ORAL_TABLET | Freq: Every evening | ORAL | Status: DC | PRN
  Filled 2013-09-08: qty 1

## 2013-09-08 MED ORDER — PHENYLEPHRINE HCL 10 MG/ML IJ SOLN
INTRAMUSCULAR | Status: DC | PRN
  Administered 2013-09-08: 40 ug via INTRAVENOUS
  Administered 2013-09-08 (×3): 80 ug via INTRAVENOUS
  Administered 2013-09-08: 40 ug via INTRAVENOUS
  Administered 2013-09-08: 80 ug via INTRAVENOUS

## 2013-09-08 MED ORDER — MIDAZOLAM HCL 2 MG/2ML IJ SOLN
INTRAMUSCULAR | Status: AC
  Filled 2013-09-08: qty 2

## 2013-09-08 MED ORDER — LIDOCAINE HCL (CARDIAC) 20 MG/ML IV SOLN
INTRAVENOUS | Status: DC | PRN
  Administered 2013-09-08: 80 mg via INTRAVENOUS

## 2013-09-08 MED ORDER — HALOPERIDOL 5 MG PO TABS
5.0000 mg | ORAL_TABLET | Freq: Every evening | ORAL | Status: DC | PRN
  Filled 2013-09-08: qty 1

## 2013-09-08 MED ORDER — ONDANSETRON HCL 4 MG/2ML IJ SOLN
INTRAMUSCULAR | Status: AC
  Filled 2013-09-08: qty 2

## 2013-09-08 MED ORDER — BRIMONIDINE TARTRATE 0.2 % OP SOLN
1.0000 [drp] | Freq: Two times a day (BID) | OPHTHALMIC | Status: DC
Start: 2013-09-08 — End: 2013-09-11
  Administered 2013-09-08 – 2013-09-11 (×5): 1 [drp] via OPHTHALMIC
  Filled 2013-09-08: qty 5

## 2013-09-08 MED ORDER — HYDROMORPHONE HCL PF 1 MG/ML IJ SOLN: 0.2500 mg | INTRAMUSCULAR | Status: DC | PRN

## 2013-09-08 MED ORDER — EPHEDRINE SULFATE 50 MG/ML IJ SOLN
INTRAMUSCULAR | Status: DC | PRN
  Administered 2013-09-08 (×4): 10 mg via INTRAVENOUS

## 2013-09-08 MED ORDER — SODIUM CHLORIDE 0.9 % IV SOLN
125.0000 mg | INTRAVENOUS | Status: DC
  Administered 2013-09-09: 125 mg via INTRAVENOUS
  Filled 2013-09-08 (×3): qty 10

## 2013-09-08 MED ORDER — DOXERCALCIFEROL 4 MCG/2ML IV SOLN
4.0000 ug | INTRAVENOUS | Status: DC
  Administered 2013-09-09: 4 ug via INTRAVENOUS
  Filled 2013-09-08: qty 2

## 2013-09-08 MED ORDER — OXYCODONE HCL 5 MG PO TABS: 5.0000 mg | ORAL_TABLET | Freq: Once | ORAL | Status: DC | PRN

## 2013-09-08 MED ORDER — INSULIN ASPART 100 UNIT/ML ~~LOC~~ SOLN
0.0000 [IU] | Freq: Three times a day (TID) | SUBCUTANEOUS | Status: DC
Start: 2013-09-08 — End: 2013-09-11
  Administered 2013-09-08: 1 [IU] via SUBCUTANEOUS
  Administered 2013-09-09 (×3): 2 [IU] via SUBCUTANEOUS
  Administered 2013-09-10 (×2): 1 [IU] via SUBCUTANEOUS
  Administered 2013-09-10 – 2013-09-11 (×3): 2 [IU] via SUBCUTANEOUS

## 2013-09-08 MED ORDER — WARFARIN SODIUM 5 MG PO TABS
5.0000 mg | ORAL_TABLET | Freq: Once | ORAL | Status: AC
  Administered 2013-09-08: 5 mg via ORAL
  Filled 2013-09-08: qty 1

## 2013-09-08 MED ORDER — CALCIUM ACETATE 667 MG PO CAPS
1334.0000 mg | ORAL_CAPSULE | Freq: Three times a day (TID) | ORAL | Status: DC
  Administered 2013-09-08 – 2013-09-11 (×8): 1334 mg via ORAL
  Filled 2013-09-08 (×11): qty 2

## 2013-09-08 MED ORDER — ONDANSETRON HCL 4 MG/2ML IJ SOLN
INTRAMUSCULAR | Status: DC | PRN
  Administered 2013-09-08: 4 mg via INTRAVENOUS

## 2013-09-08 MED ORDER — DOCUSATE SODIUM 100 MG PO CAPS
100.0000 mg | ORAL_CAPSULE | Freq: Two times a day (BID) | ORAL | Status: DC
  Administered 2013-09-08 – 2013-09-11 (×6): 100 mg via ORAL
  Filled 2013-09-08 (×7): qty 1

## 2013-09-08 MED ORDER — HYDROMORPHONE HCL PF 1 MG/ML IJ SOLN
0.5000 mg | INTRAMUSCULAR | Status: DC | PRN
  Filled 2013-09-08: qty 1

## 2013-09-08 MED ORDER — LATANOPROST 0.005 % OP SOLN
1.0000 [drp] | Freq: Every day | OPHTHALMIC | Status: DC
  Administered 2013-09-08 – 2013-09-10 (×3): 1 [drp] via OPHTHALMIC
  Filled 2013-09-08: qty 2.5

## 2013-09-08 MED ORDER — PROMETHAZINE HCL 25 MG/ML IJ SOLN: 6.2500 mg | INTRAMUSCULAR | Status: DC | PRN

## 2013-09-08 MED ORDER — PROPOFOL 10 MG/ML IV BOLUS
INTRAVENOUS | Status: DC | PRN
  Administered 2013-09-08: 150 mg via INTRAVENOUS

## 2013-09-08 MED ORDER — SODIUM CHLORIDE 0.9 % IV SOLN
INTRAVENOUS | Status: DC
Start: 2013-09-08 — End: 2013-09-11
  Administered 2013-09-08: 20 mL/h via INTRAVENOUS

## 2013-09-08 MED ORDER — 0.9 % SODIUM CHLORIDE (POUR BTL) OPTIME
TOPICAL | Status: DC | PRN
  Administered 2013-09-08: 1000 mL

## 2013-09-08 MED ORDER — FENTANYL CITRATE 0.05 MG/ML IJ SOLN
INTRAMUSCULAR | Status: AC
  Filled 2013-09-08: qty 5

## 2013-09-08 MED ORDER — CEFAZOLIN SODIUM-DEXTROSE 2-3 GM-% IV SOLR
INTRAVENOUS | Status: AC
  Filled 2013-09-08: qty 50

## 2013-09-08 MED ORDER — ONDANSETRON HCL 4 MG/2ML IJ SOLN: 4.0000 mg | Freq: Four times a day (QID) | INTRAMUSCULAR | Status: DC | PRN

## 2013-09-08 MED ORDER — FENTANYL CITRATE 0.05 MG/ML IJ SOLN
INTRAMUSCULAR | Status: DC | PRN
  Administered 2013-09-08: 100 ug via INTRAVENOUS

## 2013-09-08 MED ORDER — CALCIUM ACETATE 667 MG PO CAPS: 1334.0000 mg | ORAL_CAPSULE | Freq: Three times a day (TID) | ORAL | Status: DC

## 2013-09-08 MED ORDER — AMLODIPINE BESYLATE 10 MG PO TABS
10.0000 mg | ORAL_TABLET | Freq: Every day | ORAL | Status: DC
  Administered 2013-09-09 – 2013-09-11 (×2): 10 mg via ORAL
  Filled 2013-09-08 (×3): qty 1

## 2013-09-08 MED ORDER — OXYCODONE-ACETAMINOPHEN 5-325 MG PO TABS
1.0000 | ORAL_TABLET | ORAL | Status: DC | PRN
  Administered 2013-09-09: 2 via ORAL
  Administered 2013-09-09 – 2013-09-11 (×3): 1 via ORAL
  Filled 2013-09-08: qty 1
  Filled 2013-09-08: qty 2
  Filled 2013-09-08 (×2): qty 1

## 2013-09-08 MED ORDER — GABAPENTIN 300 MG PO CAPS
300.0000 mg | ORAL_CAPSULE | Freq: Three times a day (TID) | ORAL | Status: DC
  Administered 2013-09-08 – 2013-09-09 (×4): 300 mg via ORAL
  Filled 2013-09-08 (×7): qty 1

## 2013-09-08 MED ORDER — METOCLOPRAMIDE HCL 5 MG/ML IJ SOLN: 5.0000 mg | Freq: Three times a day (TID) | INTRAMUSCULAR | Status: DC | PRN

## 2013-09-08 MED ORDER — INSULIN GLARGINE 100 UNIT/ML ~~LOC~~ SOLN
5.0000 [IU] | Freq: Every day | SUBCUTANEOUS | Status: DC
  Administered 2013-09-08 – 2013-09-10 (×3): 5 [IU] via SUBCUTANEOUS
  Filled 2013-09-08 (×4): qty 0.05

## 2013-09-08 MED ORDER — OXYCODONE HCL 5 MG/5ML PO SOLN: 5.0000 mg | Freq: Once | ORAL | Status: DC | PRN

## 2013-09-08 MED ORDER — METOCLOPRAMIDE HCL 10 MG PO TABS: 5.0000 mg | ORAL_TABLET | Freq: Three times a day (TID) | ORAL | Status: DC | PRN

## 2013-09-08 MED ORDER — LIDOCAINE HCL (CARDIAC) 20 MG/ML IV SOLN
INTRAVENOUS | Status: AC
  Filled 2013-09-08: qty 5

## 2013-09-08 MED ORDER — SODIUM CHLORIDE 0.9 % IV SOLN
INTRAVENOUS | Status: DC
  Administered 2013-09-08: 12:00:00 via INTRAVENOUS

## 2013-09-08 MED ORDER — ONDANSETRON HCL 4 MG PO TABS: 4.0000 mg | ORAL_TABLET | Freq: Four times a day (QID) | ORAL | Status: DC | PRN

## 2013-09-08 MED ORDER — WARFARIN - PHARMACIST DOSING INPATIENT: Freq: Every day | Status: DC

## 2013-09-08 SURGICAL SUPPLY — 58 items
BANDAGE ESMARK 6X9 LF (GAUZE/BANDAGES/DRESSINGS) ×1 IMPLANT
BANDAGE GAUZE ELAST BULKY 4 IN (GAUZE/BANDAGES/DRESSINGS) ×6 IMPLANT
BLADE SAW RECIP 87.9 MT (BLADE) ×3 IMPLANT
BLADE SURG 21 STRL SS (BLADE) ×3 IMPLANT
BNDG CMPR 9X6 STRL LF SNTH (GAUZE/BANDAGES/DRESSINGS) ×1
BNDG COHESIVE 6X5 TAN STRL LF (GAUZE/BANDAGES/DRESSINGS) ×4 IMPLANT
BNDG ESMARK 6X9 LF (GAUZE/BANDAGES/DRESSINGS) ×3
BNDG GAUZE ELAST 4 BULKY (GAUZE/BANDAGES/DRESSINGS) ×2 IMPLANT
CLOTH BEACON ORANGE TIMEOUT ST (SAFETY) ×3 IMPLANT
COVER SURGICAL LIGHT HANDLE (MISCELLANEOUS) ×3 IMPLANT
CUFF TOURNIQUET SINGLE 34IN LL (TOURNIQUET CUFF) ×2 IMPLANT
CUFF TOURNIQUET SINGLE 44IN (TOURNIQUET CUFF) IMPLANT
DRAIN PENROSE 1/2X12 LTX STRL (WOUND CARE) IMPLANT
DRAPE EXTREMITY T 121X128X90 (DRAPE) ×3 IMPLANT
DRAPE PROXIMA HALF (DRAPES) ×8 IMPLANT
DRAPE U-SHAPE 47X51 STRL (DRAPES) ×6 IMPLANT
DRSG ADAPTIC 3X8 NADH LF (GAUZE/BANDAGES/DRESSINGS) ×3 IMPLANT
DRSG PAD ABDOMINAL 8X10 ST (GAUZE/BANDAGES/DRESSINGS) ×3 IMPLANT
DURAPREP 26ML APPLICATOR (WOUND CARE) ×3 IMPLANT
ELECT CAUTERY BLADE 6.4 (BLADE) ×2 IMPLANT
ELECT REM PT RETURN 9FT ADLT (ELECTROSURGICAL) ×3
ELECTRODE REM PT RTRN 9FT ADLT (ELECTROSURGICAL) ×1 IMPLANT
GLOVE BIO SURGEON STRL SZ 6.5 (GLOVE) ×1 IMPLANT
GLOVE BIO SURGEONS STRL SZ 6.5 (GLOVE) ×1
GLOVE BIOGEL PI IND STRL 6.5 (GLOVE) IMPLANT
GLOVE BIOGEL PI IND STRL 9 (GLOVE) ×1 IMPLANT
GLOVE BIOGEL PI INDICATOR 6.5 (GLOVE) ×6
GLOVE BIOGEL PI INDICATOR 9 (GLOVE) ×2
GLOVE ECLIPSE 6.0 STRL STRAW (GLOVE) ×2 IMPLANT
GLOVE ECLIPSE 6.5 STRL STRAW (GLOVE) ×2 IMPLANT
GLOVE SURG ORTHO 9.0 STRL STRW (GLOVE) ×3 IMPLANT
GOWN PREVENTION PLUS XLARGE (GOWN DISPOSABLE) ×3 IMPLANT
GOWN SRG XL XLNG 56XLVL 4 (GOWN DISPOSABLE) ×1 IMPLANT
GOWN STRL NON-REIN XL XLG LVL4 (GOWN DISPOSABLE) ×3
GOWN STRL REUS W/ TWL LRG LVL3 (GOWN DISPOSABLE) IMPLANT
GOWN STRL REUS W/TWL LRG LVL3 (GOWN DISPOSABLE) ×9
KIT BASIN OR (CUSTOM PROCEDURE TRAY) ×3 IMPLANT
KIT ROOM TURNOVER OR (KITS) ×3 IMPLANT
MANIFOLD NEPTUNE II (INSTRUMENTS) ×3 IMPLANT
NS IRRIG 1000ML POUR BTL (IV SOLUTION) ×3 IMPLANT
PACK GENERAL/GYN (CUSTOM PROCEDURE TRAY) ×3 IMPLANT
PAD ARMBOARD 7.5X6 YLW CONV (MISCELLANEOUS) ×6 IMPLANT
SPONGE GAUZE 4X4 12PLY (GAUZE/BANDAGES/DRESSINGS) ×3 IMPLANT
SPONGE GAUZE 4X4 12PLY STER LF (GAUZE/BANDAGES/DRESSINGS) ×2 IMPLANT
SPONGE LAP 18X18 X RAY DECT (DISPOSABLE) IMPLANT
STAPLER VISISTAT 35W (STAPLE) IMPLANT
STOCKINETTE IMPERVIOUS LG (DRAPES) ×3 IMPLANT
SUT PDS AB 1 CT  36 (SUTURE)
SUT PDS AB 1 CT 36 (SUTURE) IMPLANT
SUT SILK 2 0 (SUTURE) ×3
SUT SILK 2-0 18XBRD TIE 12 (SUTURE) ×1 IMPLANT
TOWEL OR 17X24 6PK STRL BLUE (TOWEL DISPOSABLE) ×3 IMPLANT
TOWEL OR 17X26 10 PK STRL BLUE (TOWEL DISPOSABLE) ×3 IMPLANT
TUBE ANAEROBIC SPECIMEN COL (MISCELLANEOUS) IMPLANT
TUBE CONNECTING 12'X1/4 (SUCTIONS) ×1
TUBE CONNECTING 12X1/4 (SUCTIONS) ×1 IMPLANT
WATER STERILE IRR 1000ML POUR (IV SOLUTION) ×3 IMPLANT
YANKAUER SUCT BULB TIP NO VENT (SUCTIONS) ×2 IMPLANT

## 2013-09-08 NOTE — Consult Note (Signed)
[facility] Renal Consultation Note  Indication for Consultation:  Management of ESRD/hemodialysis; anemia, hypertension/volume and secondary hyperparathyroidism  HPI: Oscar Castillo is a 63 y.o. male with a history of diabetes, peripheral vascular disease s/p left BKA 06/09/13, and ESRD on dialysis on TTS at the [facility] who has had progressive gangrenous changes to his right foot and presented today for right below knee amputation by Dr. Sharol Given.  He is currently stable post-surgery, but is somnolent, confused, and unable to respond appropriately to questions.  Dialysis Orders:  TTS @ [place] 4:15     83 kg      500/A1.5       2K/2Ca      Heparin 5000 U, intermittent dose of 2400 U       AVF @ LFA Hectorol 4 mcg       Epogen 0      Venofer 100 mg x 10 (through 2/24)  Past Medical History  Diagnosis Date  . ALLERGIC RHINITIS 05/05/2007  . BACK PAIN, LUMBAR, CHRONIC 08/06/2009  . BENIGN PROSTATIC HYPERTROPHY 08/01/2010  . CEREBROVASCULAR ACCIDENT, HX OF 08/06/2009  . CHOLELITHIASIS 08/01/2010  . CONGESTIVE HEART FAILURE 03/18/2009  . DEPRESSION 03/18/2009  . DIABETES MELLITUS, TYPE II 03/25/2007  . ERECTILE DYSFUNCTION 03/25/2007  . GERD 03/25/2007  . HEPATITIS C, HX OF 03/25/2007  . HYPERTENSION 03/25/2007  . Morbid obesity 03/25/2007  . NEPHROLITHIASIS, HX OF 03/18/2009  . ESRD on hemodialysis 09/08/2012    ESRD due to DM/HTN. Started dialysis in November 2013. Has LUA AVF placed Jun 14 2012 which is maturing as of Feb 2014.   [name] Renal insufficiency   . Complication of anesthesia     wife states pt had trouble waking up with his last surgery in Nov., 2014   Past Surgical History  Procedure Laterality Date  . Nephrectomy      partial RR  . Av fistula placement  06/14/2012    Procedure: ARTERIOVENOUS (AV) FISTULA CREATION;  Surgeon: Angelia Mould, MD;  Location: [facility] OR;  Service: Vascular;  Laterality: Left;  Left basilic vein transposition with fistula.  .  Tibia im nail insertion Left 09/09/2012    Procedure: INTRAMEDULLARY (IM) NAIL TIBIAL;  Surgeon: Johnny Bridge, MD;  Location: [place];  Service: Orthopedics;  Laterality: Left;  left tibial nail and open reduction internal fixation left fibula fracture  . Orif fibula fracture Left 09/09/2012    Procedure: OPEN REDUCTION INTERNAL FIXATION (ORIF) FIBULA FRACTURE;  Surgeon: Johnny Bridge, MD;  Location: [place];  Service: Orthopedics;  Laterality: Left;  . Colonoscopy N/A 10/28/2012    Procedure: COLONOSCOPY;  Surgeon: Jeryl Columbia, MD;  Location: [facility] ENDOSCOPY;  Service: Endoscopy;  Laterality: N/A;  . Esophagogastroduodenoscopy N/A 11/02/2012    Procedure: ESOPHAGOGASTRODUODENOSCOPY (EGD);  Surgeon: Cleotis Nipper, MD;  Location: [facility] ENDOSCOPY;  Service: Endoscopy;  Laterality: N/A;  . Colonoscopy N/A 11/02/2012    Procedure: COLONOSCOPY;  Surgeon: Cleotis Nipper, MD;  Location: [facility] ENDOSCOPY;  Service: Endoscopy;  Laterality: N/A;  . Colonoscopy N/A 11/03/2012    Procedure: COLONOSCOPY;  Surgeon: Cleotis Nipper, MD;  Location: [facility] ENDOSCOPY;  Service: Endoscopy;  Laterality: N/A;  . Givens capsule study N/A 11/04/2012    Procedure: GIVENS CAPSULE STUDY;  Surgeon: Cleotis Nipper, MD;  Location: [facility] ENDOSCOPY;  Service: Endoscopy;  Laterality: N/A;  . Enteroscopy N/A 11/08/2012    Procedure: ENTEROSCOPY;  Surgeon: Wonda Horner, MD;  Location: [facility] ENDOSCOPY;  Service: Endoscopy;  Laterality: N/A;  . Amputation Left 05/12/2013    Procedure: AMPUTATION RAY;  Surgeon: Newt Minion, MD;  Location: [place];  Service: Orthopedics;  Laterality: Left;  Left Foot 1st Ray Amputation  . Eye surgery Left     to remove scar tissue  . Amputation Left 06/09/2013    Procedure: AMPUTATION BELOW KNEE;  Surgeon: Newt Minion, MD;  Location: [place];  Service: Orthopedics;  Laterality: Left;  Left Below Knee Amputation and removal proximal screws IM tibial nail  . Hardware removal Left 06/09/2013    Procedure: HARDWARE  REMOVAL;  Surgeon: Newt Minion, MD;  Location: [place];  Service: Orthopedics;  Laterality: Left;  Left Below Knee Amputation  and Removal proximal screws IM tibial nail   Family History  Problem Relation Age of Onset  . Coronary artery disease Other   . Diabetes Other    Social History  He denies any history of tobacco, alcohol, or illicit drug use. He previously worked for [employer] and lives with his wife.   No Known Allergies Prior to Admission medications   Medication Sig Start Date End Date Taking? Authorizing Provider  amLODipine (NORVASC) 10 MG tablet Take 10 mg by mouth daily.   Yes Historical Provider, MD  brimonidine (ALPHAGAN) 0.2 % ophthalmic solution Place 1 drop into both eyes 2 (two) times daily.   Yes Historical Provider, MD  calcium acetate (PHOSLO) 667 MG capsule Take 2 capsules (1,334 mg total) by mouth 3 (three) times daily with meals. 06/21/12  Yes Sorin June Leap, MD  docusate sodium (COLACE) 100 MG capsule Take 1 capsule (100 mg total) by mouth 2 (two) times daily. 05/15/13  Yes Marianne L York, PA-C  gabapentin (NEURONTIN) 300 MG capsule Take 300 mg by mouth 3 (three) times daily.   Yes Historical Provider, MD  haloperidol (HALDOL) 5 MG tablet Take 1 tablet (5 mg total) by mouth at bedtime as needed for agitation. 05/31/13  Yes [name], MD  Insulin Glargine (LANTUS SOLOSTAR [place]) Inject 5 Units into the skin every evening.   Yes Historical Provider, MD  labetalol (NORMODYNE) 300 MG tablet Take 1 tablet (300 mg total) by mouth 3 (three) times daily. 05/15/13  Yes Marianne L York, PA-C  sertraline (ZOLOFT) 100 MG tablet Take 100 mg by mouth daily as needed. For depression   Yes Historical Provider, MD  silver sulfADIAZINE (SILVADENE) 1 % cream Apply 1 application topically daily.   Yes Historical Provider, MD  Travoprost, BAK Free, (TRAVATAN) 0.004 % SOLN ophthalmic solution Place 1 drop into both eyes at bedtime. 10/30/12  Yes Belkys A Regalado, MD  traZODone  (DESYREL) 50 MG tablet Take 50 mg by mouth at bedtime as needed for sleep.   Yes Historical Provider, MD  acetaminophen (TYLENOL) 500 MG tablet Take 500 mg by mouth every 6 (six) hours as needed for mild pain.    Historical Provider, MD  Brinzolamide-Brimonidine [facility]) 1-0.2 % SUSP Place 1 drop into both eyes 2 (two) times daily.     Historical Provider, MD  HYDROcodone-acetaminophen (NORCO) 7.5-325 MG per tablet Take 1 tablet by mouth every 6 (six) hours as needed for moderate pain. 05/31/13   [name], MD   Labs:  Results for orders placed during the hospital encounter of 09/08/13 (from the past 48 hour(s))  APTT     Status: Abnormal   Collection Time    09/08/13 11:52 AM      Result Value  Ref Range   aPTT 39 (*) 24 - 37 seconds   Comment:            IF BASELINE aPTT IS ELEVATED,     SUGGEST PATIENT RISK ASSESSMENT     BE USED TO DETERMINE APPROPRIATE     ANTICOAGULANT THERAPY.  CBC     Status: Abnormal   Collection Time    09/08/13 11:52 AM      Result Value Ref Range   WBC 9.4  4.0 - 10.5 K/uL   RBC 4.34  4.22 - 5.81 MIL/uL   Hemoglobin 12.3 (*) 13.0 - 17.0 g/dL   HCT 36.6 (*) 39.0 - 52.0 %   MCV 84.3  78.0 - 100.0 fL   MCH 28.3  26.0 - 34.0 pg   MCHC 33.6  30.0 - 36.0 g/dL   RDW 17.9 (*) 11.5 - 15.5 %   Platelets 319  150 - 400 K/uL  COMPREHENSIVE METABOLIC PANEL     Status: Abnormal   Collection Time    09/08/13 11:52 AM      Result Value Ref Range   Sodium 140  137 - 147 mEq/L   Potassium 4.2  3.7 - 5.3 mEq/L   Chloride 94 (*) 96 - 112 mEq/L   CO2 27  19 - 32 mEq/L   Glucose, Bld 181 (*) 70 - 99 mg/dL   BUN 29 (*) 6 - 23 mg/dL   Creatinine, Ser 8.20 (*) 0.50 - 1.35 mg/dL   Calcium 10.4  8.4 - 10.5 mg/dL   Total Protein 9.5 (*) 6.0 - 8.3 g/dL   Albumin 3.3 (*) 3.5 - 5.2 g/dL   AST 14  0 - 37 U/L   ALT 8  0 - 53 U/L   Alkaline Phosphatase 88  39 - 117 U/L   Total Bilirubin 0.4  0.3 - 1.2 mg/dL   GFR calc non Af Amer 6 (*) >90 mL/min   GFR calc Af Amer 7  (*) >90 mL/min   Comment: (NOTE)     The eGFR has been calculated using the CKD EPI equation.     This calculation has not been validated in all clinical situations.     eGFR's persistently <90 mL/min signify possible Chronic Kidney     Disease.  PROTIME-INR     Status: None   Collection Time    09/08/13 11:52 AM      Result Value Ref Range   Prothrombin Time 14.1  11.6 - 15.2 seconds   INR 1.11  0.00 - 1.49  GLUCOSE, CAPILLARY     Status: Abnormal   Collection Time    09/08/13 12:26 PM      Result Value Ref Range   Glucose-Capillary 200 (*) 70 - 99 mg/dL  GLUCOSE, CAPILLARY     Status: Abnormal   Collection Time    09/08/13  3:30 PM      Result Value Ref Range   Glucose-Capillary 137 (*) 70 - 99 mg/dL   Comment 1 Notify RN     Review of Systems not obtained due to patient factors.  Physical Exam: Filed Vitals:   09/08/13 1600  BP: 129/73  Pulse: 66  Temp:   Resp: 21     General appearance: somnolent post-surgery, no distress Head: Normocephalic, without obvious abnormality, atraumatic Neck: no adenopathy, no carotid bruit, no JVD and supple, symmetrical, trachea midline Resp: clear to auscultation bilaterally Cardio: regular rate and rhythm, S1, S2 normal, no murmur, click, rub or gallop  GI: soft, non-tender; bowel sounds normal; no masses,  no organomegaly Extremities: right BKA with dressing, left BKA covered, no noticeable edema Neurologic: Unable to examine Dialysis Access: AVF @ LFA with + bruit   Assessment/Plan: 1. R foot gangrene - s/p R BKA today per Dr. Sharol Given, stable post-surgery. 2. ESRD - HD on TTS @ [place], K 4.2.  Next HD tomorrow on schedule. 3. Hypertension/volume - BP 142/75 on Amlodipine 10 mg qd; wt 84.2 kg pre-surgery.  Needs to establish new EDW. 4. Anemia - Hgb 12.3, no outpatient Epogen, but on Venofer 100 mg x 10 (through 2/24) for T-sat 16%. 5. Metabolic bone disease - Ca 10.4, last P 5.5, iPTH 303; Hectorol 4 mcg, Phoslo 2 with  meals. 6. Nutrition - Renal diet & vitamin. 7. DM - no meds on outpatient list, last Hgb A1C 6.3. 8. PVD - s/p L BKA 06/09/13, R BKA today.  LYLES,CHARLES 09/08/2013, 4:06 PM   Attending Nephrologist: Roney Jaffe, MD  I have seen and examined patient, discussed with PA and agree with assessment and plan as outlined above. Kelly Splinter MD pager [phone]    cell [phone] 09/08/2013, 5:29 PM

## 2013-09-08 NOTE — Op Note (Signed)
OPERATIVE REPORT  DATE OF SURGERY: 09/08/2013  PATIENT:  Oscar Castillo,  63 y.o. male  PRE-OPERATIVE DIAGNOSIS:  Gangrene Right Foot  POST-OPERATIVE DIAGNOSIS:  Gangrene Right Foot  PROCEDURE:  Procedure(s): AMPUTATION BELOW KNEE right  SURGEON:  Surgeon(s): Newt Minion, MD  ANESTHESIA:   general  EBL:  min ML  SPECIMEN:  Source of Specimen:  Right leg  TOURNIQUET:   Total Tourniquet Time Documented: Thigh (Right) - 6 minutes Total: Thigh (Right) - 6 minutes   PROCEDURE DETAILS: Patient is a 63 year old gentleman diabetes end-stage renal disease on dialysis status post a left below knee amputation and presents with gangrene of the right foot. Patient is undergone prolonged conservative therapy and presents at this time for transtibial amputation. Risks and benefits were discussed including persistent infection nonhealing of the incision need for additional surgery. Patient states he understands and wished to proceed at this time. Description of procedure patient was brought to the operating room and underwent a general anesthetic after adequate levels of anesthesia were obtained patient's right lower extremity was prepped using DuraPrep draped into a sterile field and the foot was draped out of sterile field with an impervious stockinette. A transverse incision was made 11 cm distal to the tibial tubercle this curved proximally and a large posterior flap was created. The tibia was transected 1 cm proximal to the skin incision was beveled anteriorly and the fibula was transected just proximal to the tibial incision. A knife was used to create a large posterior flap the sciatic nerve was pulled cut and allowed to retract. The vascular bundles were suture ligated with 2-0 silk. Tourniquet was deflated hemostasis was obtained. The deep and superficial fascial layers were closed using #1 PDS. The skin was closed using staples. Wound was covered Adaptic orthopedic sponges AB dressing Kerlix  and Coban. Patient was extubated taken to the PACU in stable condition.  PLAN OF CARE: Admit to inpatient   PATIENT DISPOSITION:  PACU - hemodynamically stable.   Newt Minion, MD 09/08/2013 3:24 PM

## 2013-09-08 NOTE — Anesthesia Procedure Notes (Addendum)
Performed by: Raphael Gibney T   Procedure Name: LMA Insertion Date/Time: 09/08/2013 2:35 PM Performed by: Ollen Bowl Pre-anesthesia Checklist: Patient identified, Timeout performed, Emergency Drugs available, Suction available and Patient being monitored Patient Re-evaluated:Patient Re-evaluated prior to inductionOxygen Delivery Method: Circle system utilized and Simple face mask Preoxygenation: Pre-oxygenation with 100% oxygen Intubation Type: IV induction LMA: LMA inserted LMA Size: 4.0 Number of attempts: 2 Airway Equipment and Method: Patient positioned with wedge pillow Tube secured with: Tape Dental Injury: Teeth and Oropharynx as per pre-operative assessment

## 2013-09-08 NOTE — Anesthesia Postprocedure Evaluation (Signed)
Anesthesia Post Note  Patient: Oscar Castillo  Procedure(s) Performed: Procedure(s) (LRB): AMPUTATION BELOW KNEE (Right)  Anesthesia type: general  Patient location: PACU  Post pain: Pain level controlled  Post assessment: Patient's Cardiovascular Status Stable  Post vital signs: Reviewed and stable  Level of consciousness: sedated  Complications: No apparent anesthesia complications

## 2013-09-08 NOTE — H&P (Signed)
GAMALIEL CHARNEY is an 63 y.o. male.   Chief Complaint: Gangrene right foot HPI: Patient is a 63 year old gentleman with diabetes and severe peripheral vascular disease who also has end-stage renal disease undergoes dialysis who has had progressive gangrenous changes to the right foot  Past Medical History  Diagnosis Date  . ALLERGIC RHINITIS 05/05/2007  . BACK PAIN, LUMBAR, CHRONIC 08/06/2009  . BENIGN PROSTATIC HYPERTROPHY 08/01/2010  . CEREBROVASCULAR ACCIDENT, HX OF 08/06/2009  . CHOLELITHIASIS 08/01/2010  . CONGESTIVE HEART FAILURE 03/18/2009  . DEPRESSION 03/18/2009  . DIABETES MELLITUS, TYPE II 03/25/2007  . ERECTILE DYSFUNCTION 03/25/2007  . GERD 03/25/2007  . HEPATITIS C, HX OF 03/25/2007  . HYPERTENSION 03/25/2007  . Morbid obesity 03/25/2007  . NEPHROLITHIASIS, HX OF 03/18/2009  . ESRD on hemodialysis 09/08/2012    ESRD due to DM/HTN. Started dialysis in November 2013. Has LUA AVF placed Jun 14 2012 which is maturing as of Feb 2014.   [name] Renal insufficiency   . Complication of anesthesia     wife states pt had trouble waking up with his last surgery in Nov., 2014    Past Surgical History  Procedure Laterality Date  . Nephrectomy      partial RR  . Av fistula placement  06/14/2012    Procedure: ARTERIOVENOUS (AV) FISTULA CREATION;  Surgeon: Angelia Mould, MD;  Location: [facility] OR;  Service: Vascular;  Laterality: Left;  Left basilic vein transposition with fistula.  . Tibia im nail insertion Left 09/09/2012    Procedure: INTRAMEDULLARY (IM) NAIL TIBIAL;  Surgeon: Johnny Bridge, MD;  Location: [place];  Service: Orthopedics;  Laterality: Left;  left tibial nail and open reduction internal fixation left fibula fracture  . Orif fibula fracture Left 09/09/2012    Procedure: OPEN REDUCTION INTERNAL FIXATION (ORIF) FIBULA FRACTURE;  Surgeon: Johnny Bridge, MD;  Location: [place];  Service: Orthopedics;  Laterality: Left;  . Colonoscopy N/A 10/28/2012    Procedure: COLONOSCOPY;  Surgeon: Jeryl Columbia, MD;  Location: [facility] ENDOSCOPY;  Service: Endoscopy;  Laterality: N/A;  . Esophagogastroduodenoscopy N/A 11/02/2012    Procedure: ESOPHAGOGASTRODUODENOSCOPY (EGD);  Surgeon: Cleotis Nipper, MD;  Location: [facility] ENDOSCOPY;  Service: Endoscopy;  Laterality: N/A;  . Colonoscopy N/A 11/02/2012    Procedure: COLONOSCOPY;  Surgeon: Cleotis Nipper, MD;  Location: [facility] ENDOSCOPY;  Service: Endoscopy;  Laterality: N/A;  . Colonoscopy N/A 11/03/2012    Procedure: COLONOSCOPY;  Surgeon: Cleotis Nipper, MD;  Location: [facility] ENDOSCOPY;  Service: Endoscopy;  Laterality: N/A;  . Givens capsule study N/A 11/04/2012    Procedure: GIVENS CAPSULE STUDY;  Surgeon: Cleotis Nipper, MD;  Location: [facility] ENDOSCOPY;  Service: Endoscopy;  Laterality: N/A;  . Enteroscopy N/A 11/08/2012    Procedure: ENTEROSCOPY;  Surgeon: Wonda Horner, MD;  Location: [facility] ENDOSCOPY;  Service: Endoscopy;  Laterality: N/A;  . Amputation Left 05/12/2013    Procedure: AMPUTATION RAY;  Surgeon: Newt Minion, MD;  Location: [place];  Service: Orthopedics;  Laterality: Left;  Left Foot 1st Ray Amputation  . Eye surgery Left     to remove scar tissue  . Amputation Left 06/09/2013    Procedure: AMPUTATION BELOW KNEE;  Surgeon: Newt Minion, MD;  Location: [place];  Service: Orthopedics;  Laterality: Left;  Left Below Knee Amputation and removal proximal screws IM tibial nail  . Hardware removal Left 06/09/2013    Procedure: HARDWARE REMOVAL;  Surgeon: Newt Minion, MD;  Location: [place];  Service: Orthopedics;  Laterality: Left;  Left Below Knee Amputation  and Removal proximal screws IM tibial nail    Family History  Problem Relation Age of Onset  . Coronary artery disease Other   . Diabetes Other    Social History:  reports that he has never smoked. He has never used smokeless tobacco. He reports that he does not drink alcohol or use illicit drugs.  Allergies: No Known Allergies  No prescriptions prior to admission    No results found for  this or any previous visit (from the past 48 hour(s)). No results found.  Review of Systems  All other systems reviewed and are negative.    There were no vitals taken for this visit. Physical Exam  On examination patient has black gangrenous changes involving the right foot. This is dry gangrene there is no purulent drainage no swelling his foot is cold to the touch. Assessment/Plan Assessment: Dry gangrene right foot.  Plan: We'll plan for a right transtibial amputation. Risks and benefits were discussed including infection neurovascular injury persistent pain DVT need for additional surgery. Patient states he understands and wished to proceed at this time.  Quanta Robertshaw V 09/08/2013, 6:30 AM

## 2013-09-08 NOTE — Anesthesia Preprocedure Evaluation (Signed)
Anesthesia Evaluation    Reviewed: Allergy & Precautions, H&P , NPO status , Patient's Chart, lab work & pertinent test results  Airway       Dental   Pulmonary neg pulmonary ROS,          Cardiovascular hypertension, +CHF     Neuro/Psych PSYCHIATRIC DISORDERS Depression TIA   GI/Hepatic Neg liver ROS, GERD-  ,  Endo/Other  diabetes, Insulin Dependent  Renal/GU Dialysis and ESRFRenal disease     Musculoskeletal   Abdominal   Peds  Hematology   Anesthesia Other Findings   Reproductive/Obstetrics                           Anesthesia Physical Anesthesia Plan  ASA: III  Anesthesia Plan: General   Post-op Pain Management:    Induction: Intravenous  Airway Management Planned: LMA  Additional Equipment:   Intra-op Plan:   Post-operative Plan: Extubation in OR  Informed Consent:   Plan Discussed with: CRNA, Anesthesiologist and Surgeon  Anesthesia Plan Comments:         Anesthesia Quick Evaluation

## 2013-09-08 NOTE — Progress Notes (Signed)
ANTICOAGULATION CONSULT NOTE - Initial Consult  Pharmacy Consult for Warfarin Indication: VTE prophylaxis  No Known Allergies  Patient Measurements:    Vital Signs: Temp: 98.3 F (36.8 C) (02/13 1643) Temp src: Oral (02/13 1643) BP: 142/71 mmHg (02/13 1643) Pulse Rate: 68 (02/13 1643)  Labs:  Recent Labs  09/08/13 1152  HGB 12.3*  HCT 36.6*  PLT 319  APTT 39*  LABPROT 14.1  INR 1.11  CREATININE 8.20*    The CrCl is unknown because both a height and weight (above a minimum accepted value) are required for this calculation.   Medical History: Past Medical History  Diagnosis Date  . ALLERGIC RHINITIS 05/05/2007  . BACK PAIN, LUMBAR, CHRONIC 08/06/2009  . BENIGN PROSTATIC HYPERTROPHY 08/01/2010  . CEREBROVASCULAR ACCIDENT, HX OF 08/06/2009  . CHOLELITHIASIS 08/01/2010  . CONGESTIVE HEART FAILURE 03/18/2009  . DEPRESSION 03/18/2009  . DIABETES MELLITUS, TYPE II 03/25/2007  . ERECTILE DYSFUNCTION 03/25/2007  . GERD 03/25/2007  . HEPATITIS C, HX OF 03/25/2007  . HYPERTENSION 03/25/2007  . Morbid obesity 03/25/2007  . NEPHROLITHIASIS, HX OF 03/18/2009  . ESRD on hemodialysis 09/08/2012    ESRD due to DM/HTN. Started dialysis in November 2013. Has LUA AVF placed Jun 14 2012 which is maturing as of Feb 2014.   [name] Renal insufficiency   . Complication of anesthesia     wife states pt had trouble waking up with his last surgery in Nov., 2014    Medications:  Prescriptions prior to admission  Medication Sig Dispense Refill  . amLODipine (NORVASC) 10 MG tablet Take 10 mg by mouth daily.      . brimonidine (ALPHAGAN) 0.2 % ophthalmic solution Place 1 drop into both eyes 2 (two) times daily.      . calcium acetate (PHOSLO) 667 MG capsule Take 2 capsules (1,334 mg total) by mouth 3 (three) times daily with meals.  180 capsule  0  . docusate sodium (COLACE) 100 MG capsule Take 1 capsule (100 mg total) by mouth 2 (two) times daily.  10 capsule  0  . gabapentin (NEURONTIN) 300 MG capsule  Take 300 mg by mouth 3 (three) times daily.      . haloperidol (HALDOL) 5 MG tablet Take 1 tablet (5 mg total) by mouth at bedtime as needed for agitation.  90 tablet  1  . Insulin Glargine (LANTUS SOLOSTAR [place]) Inject 5 Units into the skin every evening.      . labetalol (NORMODYNE) 300 MG tablet Take 1 tablet (300 mg total) by mouth 3 (three) times daily.  90 tablet  6  . sertraline (ZOLOFT) 100 MG tablet Take 100 mg by mouth daily as needed. For depression      . silver sulfADIAZINE (SILVADENE) 1 % cream Apply 1 application topically daily.      . Travoprost, BAK Free, (TRAVATAN) 0.004 % SOLN ophthalmic solution Place 1 drop into both eyes at bedtime.  1 Bottle  0  . traZODone (DESYREL) 50 MG tablet Take 50 mg by mouth at bedtime as needed for sleep.      [name] acetaminophen (TYLENOL) 500 MG tablet Take 500 mg by mouth every 6 (six) hours as needed for mild pain.      . Brinzolamide-Brimonidine (SIMBRINZA) 1-0.2 % SUSP Place 1 drop into both eyes 2 (two) times daily.       [name] HYDROcodone-acetaminophen (NORCO) 7.5-325 MG per tablet Take 1 tablet by mouth every 6 (six) hours as needed for moderate pain.  60 tablet  0    Assessment: 63 yo M admitted 09/08/2013  For R BKA.  Pharmacy consulted to dose warfarin for VTE prophylaxis.  PMH: DM, severe PVD, ESRD, HF, GERd, HTN, Depression.  Coag: DVT Px: baseline INR wnl, s/p BKA  Goal of Therapy:  INR 2-3 Monitor platelets by anticoagulation protocol: Yes   Plan:  Warfarin 5 mg PO x1 Daily INR   Thank you for allowing pharmacy to be a part of this patients care team.  Rowe Robert Pharm.D., BCPS Clinical Pharmacist 09/08/2013 5:38 PM Pager: [phone] Phone: [phone]

## 2013-09-08 NOTE — Transfer of Care (Signed)
Immediate Anesthesia Transfer of Care Note  Patient: Oscar Castillo  Procedure(s) Performed: Procedure(s) with comments: AMPUTATION BELOW KNEE (Right) - Right Below Knee Amputation  Patient Location: PACU  Anesthesia Type:General  Level of Consciousness: lethargic and responds to stimulation  Airway & Oxygen Therapy: Patient Spontanous Breathing and Patient connected to nasal cannula oxygen  Post-op Assessment: Report given to PACU RN and Post -op Vital signs reviewed and stable  Post vital signs: Reviewed and stable  Complications: No apparent anesthesia complications

## 2013-09-09 DIAGNOSIS — N2581 Secondary hyperparathyroidism of renal origin: Secondary | ICD-10-CM | POA: Diagnosis not present

## 2013-09-09 DIAGNOSIS — I120 Hypertensive chronic kidney disease with stage 5 chronic kidney disease or end stage renal disease: Secondary | ICD-10-CM | POA: Diagnosis not present

## 2013-09-09 DIAGNOSIS — D631 Anemia in chronic kidney disease: Secondary | ICD-10-CM | POA: Diagnosis not present

## 2013-09-09 DIAGNOSIS — N186 End stage renal disease: Secondary | ICD-10-CM | POA: Diagnosis not present

## 2013-09-09 LAB — RENAL FUNCTION PANEL
Albumin: 2.9 g/dL — ABNORMAL LOW (ref 3.5–5.2)
BUN: 40 mg/dL — AB (ref 6–23)
CO2: 27 mEq/L (ref 19–32)
Calcium: 9.8 mg/dL (ref 8.4–10.5)
Chloride: 94 mEq/L — ABNORMAL LOW (ref 96–112)
Creatinine, Ser: 9.52 mg/dL — ABNORMAL HIGH (ref 0.50–1.35)
GFR calc Af Amer: 6 mL/min — ABNORMAL LOW (ref >90)
GFR calc non Af Amer: 5 mL/min — ABNORMAL LOW (ref >90)
GLUCOSE: 178 mg/dL — AB (ref 70–99)
Phosphorus: 5.7 mg/dL — ABNORMAL HIGH (ref 2.3–4.6)
Potassium: 4.5 mEq/L (ref 3.7–5.3)
Sodium: 138 mEq/L (ref 137–147)

## 2013-09-09 LAB — CBC
HEMATOCRIT: 33.1 % — AB (ref 39.0–52.0)
HEMOGLOBIN: 10.7 g/dL — AB (ref 13.0–17.0)
MCH: 27.5 pg (ref 26.0–34.0)
MCHC: 32.3 g/dL (ref 30.0–36.0)
MCV: 85.1 fL (ref 78.0–100.0)
Platelets: 300 10*3/uL (ref 150–400)
RBC: 3.89 MIL/uL — ABNORMAL LOW (ref 4.22–5.81)
RDW: 18 % — ABNORMAL HIGH (ref 11.5–15.5)
WBC: 9.6 10*3/uL (ref 4.0–10.5)

## 2013-09-09 LAB — GLUCOSE, CAPILLARY
GLUCOSE-CAPILLARY: 141 mg/dL — AB (ref 70–99)
Glucose-Capillary: 180 mg/dL — ABNORMAL HIGH (ref 70–99)
Glucose-Capillary: 181 mg/dL — ABNORMAL HIGH (ref 70–99)
Glucose-Capillary: 162 mg/dL — ABNORMAL HIGH (ref 70–99)

## 2013-09-09 LAB — PROTIME-INR
INR: 1.13 (ref 0.00–1.49)
PROTHROMBIN TIME: 14.3 s (ref 11.6–15.2)

## 2013-09-09 MED ORDER — ALTEPLASE 2 MG IJ SOLR: 2.0000 mg | Freq: Once | INTRAMUSCULAR | Status: AC | PRN

## 2013-09-09 MED ORDER — LIDOCAINE-PRILOCAINE 2.5-2.5 % EX CREA: 1.0000 "application " | TOPICAL_CREAM | CUTANEOUS | Status: DC | PRN

## 2013-09-09 MED ORDER — DOXERCALCIFEROL 4 MCG/2ML IV SOLN
INTRAVENOUS | Status: AC
  Administered 2013-09-09: 4 ug via INTRAVENOUS
  Filled 2013-09-09: qty 2

## 2013-09-09 MED ORDER — HEPARIN SODIUM (PORCINE) 1000 UNIT/ML DIALYSIS
1000.0000 [IU] | INTRAMUSCULAR | Status: DC | PRN
Start: 2013-09-09 — End: 2013-09-10

## 2013-09-09 MED ORDER — PENTAFLUOROPROP-TETRAFLUOROETH EX AERO: 1.0000 "application " | INHALATION_SPRAY | CUTANEOUS | Status: DC | PRN

## 2013-09-09 MED ORDER — WARFARIN SODIUM 7.5 MG PO TABS
7.5000 mg | ORAL_TABLET | Freq: Once | ORAL | Status: AC
  Administered 2013-09-09: 7.5 mg via ORAL
  Filled 2013-09-09: qty 1

## 2013-09-09 MED ORDER — SODIUM CHLORIDE 0.9 % IV SOLN: 100.0000 mL | INTRAVENOUS | Status: DC | PRN

## 2013-09-09 MED ORDER — NEPRO/CARBSTEADY PO LIQD: 237.0000 mL | ORAL | Status: DC | PRN

## 2013-09-09 MED ORDER — LIDOCAINE HCL (PF) 1 % IJ SOLN
5.0000 mL | INTRAMUSCULAR | Status: DC | PRN
Start: 2013-09-09 — End: 2013-09-10

## 2013-09-09 NOTE — Evaluation (Signed)
Physical Therapy Evaluation Patient Details Name: Oscar Castillo MRN: [id number] DOB: Oct 02, 1950 Today's Date: 09/09/2013 Time: 9381-0175 PT Time Calculation (min): 41 min  PT Assessment / Plan / Recommendation History of Present Illness  63 y.o. male admitted to [facility] on 09/08/12 with gangrenous right foot.  He underwent R BKA on day of admission.  Of note, he has history of L BKA as well.  Pt with significant PMHx of diabetes and severe peripheral vascular disease who also has end-stage renal disease undergoes dialysis.    Clinical Impression  Pt is POD #1 s/p R BKA in the setting of recent L BKA.  He is requiring quite a bit of assistance just to sit up on the side of the bed and will likely take two person assist to use a slide board to transfer to [facility].  He will need extensive therapy before d/c home with his wife.  Recommending SNF level therapies and wife requesting [facility] as it is close to their home.  PT to follow acutely for deficits listed below.       PT Assessment  Patient needs continued PT services    Follow Up Recommendations  SNF    Does the patient have the potential to tolerate intense rehabilitation     NA  Barriers to Discharge   Wife needs to be able to physically handle him and right now he would be two person assit.       Equipment Recommendations  Other (comment) (will need a long slide board for car transfers)    Recommendations for Other Services   NA  Frequency Min 2X/week    Precautions / Restrictions Precautions Precautions: Fall Precaution Comments: pt also with poor vision, reporting he has to have cataract surgery Restrictions RLE Weight Bearing: Non weight bearing   Pertinent Vitals/Pain See vitals flow sheet.       Mobility  Bed Mobility Overal bed mobility: Needs Assistance Bed Mobility: Supine to Sit Supine to sit: Max assist;HOB elevated General bed mobility comments: max assist to support trunk and pivot hips to EOB using bedpad.   Verbal and manual cues needed to reach for the railing and to use railing for support.  Pt fearful of falling off of the bed and leaning posteriorly throughout transition to sitting.     Exercises Amputee Exercises Quad Sets: AROM;Both;10 reps;Supine Hip ABduction/ADduction: AROM;Both;10 reps;Supine Knee Flexion: AROM;Both;10 reps;Supine Knee Extension: AROM;Both;10 reps;Seated Straight Leg Raises: AROM;Both;AAROM;10 reps;Supine   PT Diagnosis: Difficulty walking;Abnormality of gait;Generalized weakness;Acute pain  PT Problem List: Decreased strength;Decreased range of motion;Decreased activity tolerance;Decreased balance;Decreased mobility;Decreased knowledge of use of DME;Decreased knowledge of precautions PT Treatment Interventions: DME instruction;Stair training;Functional mobility training;Therapeutic activities;Therapeutic exercise;Balance training;Neuromuscular re-education;Patient/family education;Modalities;Wheelchair mobility training     PT Goals(Current goals can be found in the care plan section) Acute Rehab PT Goals Patient Stated Goal: to go home PT Goal Formulation: With patient/family Time For Goal Achievement: 09/23/13 Potential to Achieve Goals: Good  Visit Information  Last PT Received On: 09/09/13 Assistance Needed: +2 History of Present Illness: 63 y.o. male admitted to [facility] on 09/08/12 with gangrenous right foot.  He underwent R BKA on day of admission.  Of note, he has history of L BKA as well.  Pt with significant PMHx of diabetes and severe peripheral vascular disease who also has end-stage renal disease undergoes dialysis.         Prior Functioning  Home Living Family/patient expects to be discharged to:: Skilled nursing facility [facility]) Living  Arrangements: Spouse/significant other Available Help at Discharge: Family Type of Home: House Home Access: Level entry (from the den entrance) Home Layout: One level (2 steps to get into the den) Home  Equipment: Gilford Rile - 2 wheels;Cane - single point;Bedside commode;Shower seat;Wheelchair - manual;Hospital bed (urinal) Additional Comments: Wife reports that she has his hospital bed set up in the den which is a level entry.  He cannot get up into the rest of the house due to two stairs to get there from the den.  Wife is unable/not stong eoungh to bump him up in his WC.   Prior Function Level of Independence: Needs assistance Gait / Transfers Assistance Needed: assist needed from wife before surgery to get into and out of WC ADL's / Homemaking Assistance Needed: assist needed to bathe and dress Communication / Swallowing Assistance Needed: NA Comments: considering [facility] for SNF therapy Communication Communication: No difficulties Dominant Hand: Right    Cognition  Cognition Arousal/Alertness: Awake/alert Behavior During Therapy: WFL for tasks assessed/performed Overall Cognitive Status: Impaired/Different from baseline Area of Impairment: Problem solving;Awareness Memory: Decreased short-term memory Awareness: Emergent Problem Solving: Slow processing General Comments: Pt generally oriented, slow to answer questions, could not remember the month of his other amputation.      Extremity/Trunk Assessment Upper Extremity Assessment Upper Extremity Assessment: Defer to OT evaluation Lower Extremity Assessment Lower Extremity Assessment: RLE deficits/detail;LLE deficits/detail RLE Deficits / Details: right leg with normal post-op weakness.  Limited knee flexion and extension, 3-/5 hip flexion on the right.  LLE Deficits / Details: strength 3/5 throughout, ROM at least to 90 degrees knee flexion mission ~10 degrees of full knee extension on this side.   Cervical / Trunk Assessment Cervical / Trunk Assessment: Normal   Balance Balance Overall balance assessment: Needs assistance Sitting-balance support: Bilateral upper extremity supported Sitting balance-Leahy Scale: Poor Sitting  balance - Comments: max assist upon first sitting to prevent posterior LOB even with left hand on bed rail and right hand on bed for support.  After ~10 min sitting EOB and increased confidence that he was not going to fall, pt could do ~30 seconds of supervision wtih bil upper extremity prop before LOB posteriorly.    End of Session PT - End of Session Activity Tolerance: Patient limited by fatigue Patient left: in bed;with call bell/phone within reach;with family/visitor present Nurse Communication: Other (comment) (pt asking for grape juice)    Wells Guiles B. [place], [place], DPT [phone]   09/09/2013, 2:56 PM

## 2013-09-09 NOTE — Progress Notes (Signed)
Subjective:  Seen on dialysis, alert, comfortable, no complaints  Objective: Vital signs in last 24 hours: Temp:  [97.2 F (36.2 C)-98.6 F (37 C)] 98.5 F (36.9 C) (02/14 0803) Pulse Rate:  [66-76] 76 (02/14 0817) Resp:  [8-25] 18 (02/14 0817) BP: (120-145)/(68-82) 145/82 mmHg (02/14 0817) SpO2:  [95 %-100 %] 95 % (02/14 0803) Weight:  [81.8 kg (180 lb 5.4 oz)] 81.8 kg (180 lb 5.4 oz) (02/14 0803) Weight change:   Intake/Output from previous day: 02/13 0701 - 02/14 0700 In: 570 [P.O.:120; I.V.:450] Out: -  Intake/Output this shift: Total I/O In: 120 [P.O.:120] Out: -   Lab Results:  Recent Labs  09/08/13 1152  WBC 9.4  HGB 12.3*  HCT 36.6*  PLT 319   BMET:  Recent Labs  09/08/13 1152  NA 140  K 4.2  CL 94*  CO2 27  GLUCOSE 181*  BUN 29*  CREATININE 8.20*  CALCIUM 10.4  ALBUMIN 3.3*   No results found for this basename: PTH,  in the last 72 hours Iron Studies: No results found for this basename: IRON, TIBC, TRANSFERRIN, FERRITIN,  in the last 72 hours  EXAM: Gen:  Alert, in no apparent distress Resp: CTA without rales, rhonchi, or wheezes Cardio:  RRR without murmur or rub GI: + BS, soft and nontender Ext:  R BKA with dressing, also L BKA Access:  AVF @ LFA with BFR 500  Dialysis Orders: T[facility]  4:15 83 kg 500/A1.5 2K/2Ca Heparin 5000 U, intermittent dose of 2400 U AVF @ LFA  Hectorol 4 mcg Epogen 0 Venofer 100 mg x 10 (through 2/24)  Assessment/Plan: 1. R foot gangrene - s/p R BKA 2/13 per Dr. Sharol Given, stable. 2. ESRD - HD on TTS @ [place], K 4.2. Next HD tomorrow on schedule. 3. Hypertension/volume - BP 142/75 on Amlodipine 10 mg qd; wt 81.8 kg post-surgery, UF goal of 3 L today. Needs to establish new EDW. 4. Anemia - Hgb 12.3, no outpatient Epogen, but on Venofer 100 mg x 10 (through 2/24) for T-sat 16%. 5. Metabolic bone disease - Ca 10.4, last P 5.5, iPTH 303; Hectorol 4 mcg, Phoslo 2 with meals. 6. Nutrition - Renal diet & vitamin. 7. DM -  insulin per primary, last Hgb A1C 6.3. 8. PVD - s/p L BKA 06/09/13, R BKA 2/13.     LOS: 1 day   LYLES,CHARLES 09/09/2013,8:20 AM  I have seen and examined patient, discussed with PA and agree with assessment and plan as outlined above. Kelly Splinter MD pager [phone]    cell [phone] 09/09/2013, 11:30 AM

## 2013-09-09 NOTE — Progress Notes (Signed)
Patient ID: Oscar Castillo, male   DOB: [birth year], 63 y.o.   MRN: [id number] Patient is much more alert this morning that he was preoperatively. His wife is feeding him and he is eating well. Plan for dialysis today. Plan for discharge to skilled nursing facility.

## 2013-09-09 NOTE — Progress Notes (Signed)
ANTICOAGULATION CONSULT NOTE - Follow up [facility] for Warfarin Indication: VTE prophylaxis  No Known Allergies  Patient Measurements: Weight: 180 lb 5.4 oz (81.8 kg)  Vital Signs: Temp: 98.8 F (37.1 C) (02/14 1030) Temp src: Oral (02/14 1030) BP: 130/78 mmHg (02/14 1030) Pulse Rate: 81 (02/14 1030)  Labs:  Recent Labs  09/08/13 1152 09/09/13 0450 09/09/13 0818  HGB 12.3*  --  10.7*  HCT 36.6*  --  33.1*  PLT 319  --  300  APTT 39*  --   --   LABPROT 14.1 14.3  --   INR 1.11 1.13  --   CREATININE 8.20*  --  9.52*    The CrCl is unknown because both a height and weight (above a minimum accepted value) are required for this calculation.   Assessment: 63 yo M admitted 09/08/2013  For R BKA.  Pharmacy managing warfarin for VTE prophylaxis. INR remains sub-therapeutic at 1.13 after one dose of warfarin as expected. H/H trended down to 10.7/33.1 today. Likely post-op anemia. No bleeding noted.   PMH: DM, severe PVD, ESRD, HF, GERd, HTN, Depression.  Goal of Therapy:  INR 2-3 Monitor platelets by anticoagulation protocol: Yes   Plan:  -Warfarin 7.5 mg PO x1 -Daily INR -Educate patient on warfarin

## 2013-09-09 NOTE — Progress Notes (Signed)
OT Cancellation Note  Patient Details Name: Oscar Castillo MRN: [id number] DOB: 1950/08/16   Cancelled Treatment:    Reason Eval/Treat Not Completed: Other (comment) Per chart pt is Medicare and plan is for SNF. Will defer OT eval to that facility. Please let OT know if eval is needed by contacting [phone] or going to [email or web address] (automatically changes to [facility]) or using [employer] thru the Web Links tab on EPIC and do an all OT page at [phone]   Almon Register [phone] 09/09/2013, 11:31 AM

## 2013-09-10 DIAGNOSIS — I120 Hypertensive chronic kidney disease with stage 5 chronic kidney disease or end stage renal disease: Secondary | ICD-10-CM | POA: Diagnosis not present

## 2013-09-10 DIAGNOSIS — D631 Anemia in chronic kidney disease: Secondary | ICD-10-CM | POA: Diagnosis not present

## 2013-09-10 DIAGNOSIS — N2581 Secondary hyperparathyroidism of renal origin: Secondary | ICD-10-CM | POA: Diagnosis not present

## 2013-09-10 DIAGNOSIS — N186 End stage renal disease: Secondary | ICD-10-CM | POA: Diagnosis not present

## 2013-09-10 LAB — PROTIME-INR
INR: 1.43 (ref 0.00–1.49)
PROTHROMBIN TIME: 17.1 s — AB (ref 11.6–15.2)

## 2013-09-10 LAB — GLUCOSE, CAPILLARY
GLUCOSE-CAPILLARY: 180 mg/dL — AB (ref 70–99)
Glucose-Capillary: 174 mg/dL — ABNORMAL HIGH (ref 70–99)
Glucose-Capillary: 140 mg/dL — ABNORMAL HIGH (ref 70–99)
Glucose-Capillary: 164 mg/dL — ABNORMAL HIGH (ref 70–99)
Glucose-Capillary: 132 mg/dL — ABNORMAL HIGH (ref 70–99)

## 2013-09-10 MED ORDER — WARFARIN SODIUM 7.5 MG PO TABS
7.5000 mg | ORAL_TABLET | Freq: Once | ORAL | Status: AC
  Administered 2013-09-10: 7.5 mg via ORAL
  Filled 2013-09-10: qty 1

## 2013-09-10 NOTE — Progress Notes (Signed)
ANTICOAGULATION CONSULT NOTE - Follow up [place] for Warfarin Indication: VTE prophylaxis  No Known Allergies  Patient Measurements: Height: 5\' 10"  (177.8 cm) (prior to amputation) Weight: 152 lb (68.947 kg) IBW/kg (Calculated) : 73  Vital Signs: Temp: 98.5 F (36.9 C) (02/15 0638) Temp src: Oral (02/15 0638) BP: 108/64 mmHg (02/15 0638) Pulse Rate: 70 (02/15 0638)  Labs:  Recent Labs  09/08/13 1152 09/09/13 0450 09/09/13 0818 09/10/13 0543  HGB 12.3*  --  10.7*  --   HCT 36.6*  --  33.1*  --   PLT 319  --  300  --   APTT 39*  --   --   --   LABPROT 14.1 14.3  --  17.1*  INR 1.11 1.13  --  1.43  CREATININE 8.20*  --  9.52*  --     Estimated Creatinine Clearance: 7.8 ml/min (by C-G formula based on Cr of 9.52).   Assessment: 63 yo M admitted 09/08/2013  For R BKA.  Pharmacy managing warfarin for VTE prophylaxis. INR remains sub-therapeutic but has trended up 1.11>>1.13>>1.43 after 2 doses of coumadin. H/H trended down to 10.7/33.1 today. Likely post-op anemia. Per orthopedics note, Left amputation site has slight amount of drainage but improving. Right amputation site is clean, dry and intact   PMH: DM, severe PVD, ESRD, HF, GERd, HTN, Depression.  Goal of Therapy:  INR 2-3 Monitor platelets by anticoagulation protocol: Yes   Plan:  -Repeat warfarin 7.5 mg PO x1 -Daily INR -Monitor for s/s of bleeding -Educate patient on warfarin    Albertina Parr, PharmD.  Clinical Pharmacist Pager [phone]

## 2013-09-10 NOTE — Progress Notes (Addendum)
Clinical Social Work Department CLINICAL SOCIAL WORK PLACEMENT NOTE 09/10/2013  Patient:  KEO, SCHIRMER  Account Number:  [id number] Admit date:  09/08/2013  Clinical Social Worker:  Tilden Fossa, Latanya Presser  Date/time:  09/10/2013 05:38 PM  Clinical Social Work is seeking post-discharge placement for this patient at the following level of care:   SKILLED NURSING   (*CSW will update this form in Epic as items are completed)     Patient/family provided with [facility] Department of Clinical Social Work's list of facilities offering this level of care within the geographic area requested by the patient (or if unable, by the patient's family).  09/10/2013  Patient/family informed of their freedom to choose among providers that offer the needed level of care, that participate in Medicare, Medicaid or managed care program needed by the patient, have an available bed and are willing to accept the patient.    Patient/family informed of [facility]' ownership interest in [facility], as well as of the fact that they are under no obligation to receive care at this facility.  PASARR submitted to EDS on 06/11/2013 PASARR number received from EDS on 06/11/2013  FL2 transmitted to all facilities in geographic area requested by pt/family on  09/10/2013 FL2 transmitted to all facilities within larger geographic area on   Patient informed that his/her managed care company has contracts with or will negotiate with  certain facilities, including the following:     Patient/family informed of bed offers received:  09/11/2013 Patient chooses bed at [facility] Physician recommends and patient chooses bed at    Patient to be transferred to  on  09/11/2013 Patient to be transferred to facility by [facility]  The following physician request were entered in Epic:   Additional Comments:  Tilden Fossa, MSW, [place] Worker [facility] Emergency Dept. [phone]

## 2013-09-10 NOTE — Progress Notes (Signed)
Clinical Social Work Department BRIEF PSYCHOSOCIAL ASSESSMENT 09/10/2013  Patient:  Oscar Castillo, Oscar Castillo     Account Number:  [id number]     Admit date:  09/08/2013  Clinical Social Worker:  Rolinda Roan  Date/Time:  09/10/2013 06:18 PM  Referred by:  Physician  Date Referred:  09/09/2013 Referred for  SNF Placement   Other Referral:   Interview type:  Family Other interview type:    PSYCHOSOCIAL DATA Living Status:  WIFE Admitted from facility:   Level of care:   Primary support name:  Oscar Castillo Primary support relationship to patient:  SPOUSE Degree of support available:   Very supportive.    CURRENT CONCERNS  Other Concerns:    SOCIAL WORK ASSESSMENT / PLAN Clinical Social Worker (CSW) met with patient and wife at bedside. Patient went to sleep during assessment and wife completed assessment with CSW. Wife reported that patient is on dialysis and goes to the [facility] Tuesday, Thursday, Saturday at 6 am. Wife reported that she would like for patient to go to [facility]. CSW encouraged wife to choose a back up SNF [facility] did not have a bed. Wife verbalized her understanding.   Assessment/plan status:  Psychosocial Support/Ongoing Assessment of Needs Other assessment/ plan:   Information/referral to community resources:   CSW gave wife SNF list.    PATIENT'S/FAMILY'S RESPONSE TO PLAN OF CARE: Wife thanked CSW for visit.

## 2013-09-10 NOTE — Progress Notes (Signed)
Patient ID: Oscar Castillo, male   DOB: 25-Jun-1951, 63 y.o.   MRN: [id number] Dialysis was stopped early yesterday secondary to patient's complaints of pain. His creatinine continues to elevate. Patient has developed a slight tremor in his hands. Patient's left transtibial amputation has a slight amount of drainage but overall looks much better. Right transtibial dressing is clean dry and intact. Plan for discharge to skilled nursing. Anticipate dialysis on Monday or Tuesday

## 2013-09-10 NOTE — Progress Notes (Signed)
Subjective:   No complaints, feeling better; tremor in hands since surgery, but improving  Objective: Vital signs in last 24 hours: Temp:  [98.5 F (36.9 C)-99.8 F (37.7 C)] 98.5 F (36.9 C) (02/15 1610) Pulse Rate:  [70-83] 70 (02/15 0638) Resp:  [16-20] 16 (02/15 0638) BP: (108-156)/(64-82) 108/64 mmHg (02/15 0638) SpO2:  [94 %-99 %] 98 % (02/15 9604) Weight:  [68.947 kg (152 lb)-81.8 kg (180 lb 5.4 oz)] 68.947 kg (152 lb) (02/14 2300) Weight change:   Intake/Output from previous day: 02/14 0701 - 02/15 0700 In: 720 [P.O.:720] Out: 1384 [Urine:1]   Lab Results:  Recent Labs  09/08/13 1152 09/09/13 0818  WBC 9.4 9.6  HGB 12.3* 10.7*  HCT 36.6* 33.1*  PLT 319 300   BMET:  Recent Labs  09/08/13 1152 09/09/13 0818  NA 140 138  K 4.2 4.5  CL 94* 94*  CO2 27 27  GLUCOSE 181* 178*  BUN 29* 40*  CREATININE 8.20* 9.52*  CALCIUM 10.4 9.8  ALBUMIN 3.3* 2.9*   No results found for this basename: PTH,  in the last 72 hours Iron Studies: No results found for this basename: IRON, TIBC, TRANSFERRIN, FERRITIN,  in the last 72 hours  EXAM:  Gen: Alert, in no apparent distress  Resp: CTA without rales, rhonchi, or wheezes  Cardio: RRR without murmur or rub  GI: + BS, soft and nontender  Ext: R BKA with dressing, also L BKA  Access: AVF @ LFA with + bruit  Dialysis Orders: TTS @ [facility]  4:15 83 kg 500/A1.5 2K/2Ca Heparin 5000 U, intermittent dose of 2400 U AVF @ LFA  Hectorol 4 mcg Epogen 0 Venofer 100 mg x 10 (through 2/24)  Assessment/Plan: 1. R foot gangrene - s/p R BKA 2/13 per Dr. Sharol Given, stable.  2. ESRD - HD on TTS @ [place], K 4.5.  Next HD 2/17.  3. Hypertension/volume - BP 108/64 on Amlodipine 10 mg qd; wt 68.9 kg (?) s/p net UF 1.4 L yesterday. Needs to establish new EDW.  4. Anemia - Hgb 10.7, no outpatient Epogen, but on Venofer 100 mg x 10 (through 2/24) for T-sat 16%.  5. Metabolic bone disease - Ca 9.8 (10.7 corrected), P 5.7, iPTH 303; Hectorol 4 mcg, Phoslo  2 with meals.  6. Nutrition - Alb 2.9, renal diet & vitamin.  7. DM - insulin per primary, last Hgb A1C 6.3.  8. PVD - s/p L BKA 06/09/13, R BKA 2/13.     LOS: 2 days   LYLES,CHARLES 09/10/2013,7:33 AM  I have seen and examined patient, discussed with PA and agree with assessment and plan as outlined above.  The shaking and mild asterixis is due to scheduled neurontin at 300 tid, he was taking this only prn at home and max dose for esrd pts is 600 total per day. Have d/c'd neurontin for now, resume prn 300 bid at home.  Kelly Splinter MD pager [phone]    cell [phone] 09/10/2013, 9:09 AM

## 2013-09-11 DIAGNOSIS — T8131XA Disruption of external operation (surgical) wound, not elsewhere classified, initial encounter: Secondary | ICD-10-CM | POA: Diagnosis not present

## 2013-09-11 DIAGNOSIS — E872 Acidosis, unspecified: Secondary | ICD-10-CM | POA: Diagnosis present

## 2013-09-11 DIAGNOSIS — R269 Unspecified abnormalities of gait and mobility: Secondary | ICD-10-CM | POA: Diagnosis not present

## 2013-09-11 DIAGNOSIS — N186 End stage renal disease: Secondary | ICD-10-CM | POA: Diagnosis not present

## 2013-09-11 DIAGNOSIS — E1069 Type 1 diabetes mellitus with other specified complication: Secondary | ICD-10-CM | POA: Diagnosis present

## 2013-09-11 DIAGNOSIS — N400 Enlarged prostate without lower urinary tract symptoms: Secondary | ICD-10-CM | POA: Diagnosis not present

## 2013-09-11 DIAGNOSIS — M79609 Pain in unspecified limb: Secondary | ICD-10-CM | POA: Diagnosis not present

## 2013-09-11 DIAGNOSIS — I6350 Cerebral infarction due to unspecified occlusion or stenosis of unspecified cerebral artery: Secondary | ICD-10-CM | POA: Diagnosis not present

## 2013-09-11 DIAGNOSIS — I120 Hypertensive chronic kidney disease with stage 5 chronic kidney disease or end stage renal disease: Secondary | ICD-10-CM | POA: Diagnosis present

## 2013-09-11 DIAGNOSIS — L97809 Non-pressure chronic ulcer of other part of unspecified lower leg with unspecified severity: Secondary | ICD-10-CM | POA: Diagnosis present

## 2013-09-11 DIAGNOSIS — I447 Left bundle-branch block, unspecified: Secondary | ICD-10-CM | POA: Diagnosis not present

## 2013-09-11 DIAGNOSIS — Z6841 Body Mass Index (BMI) 40.0 and over, adult: Secondary | ICD-10-CM | POA: Diagnosis not present

## 2013-09-11 DIAGNOSIS — R509 Fever, unspecified: Secondary | ICD-10-CM | POA: Diagnosis not present

## 2013-09-11 DIAGNOSIS — G92 Toxic encephalopathy: Secondary | ICD-10-CM | POA: Diagnosis present

## 2013-09-11 DIAGNOSIS — L0390 Cellulitis, unspecified: Secondary | ICD-10-CM | POA: Diagnosis not present

## 2013-09-11 DIAGNOSIS — D631 Anemia in chronic kidney disease: Secondary | ICD-10-CM | POA: Diagnosis not present

## 2013-09-11 DIAGNOSIS — E119 Type 2 diabetes mellitus without complications: Secondary | ICD-10-CM | POA: Diagnosis not present

## 2013-09-11 DIAGNOSIS — R0902 Hypoxemia: Secondary | ICD-10-CM | POA: Diagnosis not present

## 2013-09-11 DIAGNOSIS — A419 Sepsis, unspecified organism: Secondary | ICD-10-CM | POA: Diagnosis present

## 2013-09-11 DIAGNOSIS — R1312 Dysphagia, oropharyngeal phase: Secondary | ICD-10-CM | POA: Diagnosis not present

## 2013-09-11 DIAGNOSIS — R489 Unspecified symbolic dysfunctions: Secondary | ICD-10-CM | POA: Diagnosis not present

## 2013-09-11 DIAGNOSIS — Z4789 Encounter for other orthopedic aftercare: Secondary | ICD-10-CM | POA: Diagnosis not present

## 2013-09-11 DIAGNOSIS — M869 Osteomyelitis, unspecified: Secondary | ICD-10-CM | POA: Diagnosis present

## 2013-09-11 DIAGNOSIS — I5042 Chronic combined systolic (congestive) and diastolic (congestive) heart failure: Secondary | ICD-10-CM | POA: Diagnosis not present

## 2013-09-11 DIAGNOSIS — L89109 Pressure ulcer of unspecified part of back, unspecified stage: Secondary | ICD-10-CM | POA: Diagnosis present

## 2013-09-11 DIAGNOSIS — R41841 Cognitive communication deficit: Secondary | ICD-10-CM | POA: Diagnosis not present

## 2013-09-11 DIAGNOSIS — R627 Adult failure to thrive: Secondary | ICD-10-CM | POA: Diagnosis present

## 2013-09-11 DIAGNOSIS — F29 Unspecified psychosis not due to a substance or known physiological condition: Secondary | ICD-10-CM | POA: Diagnosis not present

## 2013-09-11 DIAGNOSIS — T8789 Other complications of amputation stump: Secondary | ICD-10-CM | POA: Diagnosis present

## 2013-09-11 DIAGNOSIS — N2581 Secondary hyperparathyroidism of renal origin: Secondary | ICD-10-CM | POA: Diagnosis present

## 2013-09-11 DIAGNOSIS — R64 Cachexia: Secondary | ICD-10-CM | POA: Diagnosis present

## 2013-09-11 DIAGNOSIS — E875 Hyperkalemia: Secondary | ICD-10-CM | POA: Diagnosis present

## 2013-09-11 DIAGNOSIS — N200 Calculus of kidney: Secondary | ICD-10-CM | POA: Diagnosis not present

## 2013-09-11 DIAGNOSIS — I10 Essential (primary) hypertension: Secondary | ICD-10-CM | POA: Diagnosis not present

## 2013-09-11 DIAGNOSIS — E1029 Type 1 diabetes mellitus with other diabetic kidney complication: Secondary | ICD-10-CM | POA: Diagnosis not present

## 2013-09-11 DIAGNOSIS — Z992 Dependence on renal dialysis: Secondary | ICD-10-CM | POA: Diagnosis not present

## 2013-09-11 DIAGNOSIS — K219 Gastro-esophageal reflux disease without esophagitis: Secondary | ICD-10-CM | POA: Diagnosis not present

## 2013-09-11 DIAGNOSIS — M6281 Muscle weakness (generalized): Secondary | ICD-10-CM | POA: Diagnosis not present

## 2013-09-11 DIAGNOSIS — R279 Unspecified lack of coordination: Secondary | ICD-10-CM | POA: Diagnosis not present

## 2013-09-11 DIAGNOSIS — T8740 Infection of amputation stump, unspecified extremity: Secondary | ICD-10-CM | POA: Diagnosis present

## 2013-09-11 DIAGNOSIS — I96 Gangrene, not elsewhere classified: Secondary | ICD-10-CM | POA: Diagnosis not present

## 2013-09-11 DIAGNOSIS — F05 Delirium due to known physiological condition: Secondary | ICD-10-CM | POA: Diagnosis not present

## 2013-09-11 DIAGNOSIS — E870 Hyperosmolality and hypernatremia: Secondary | ICD-10-CM | POA: Diagnosis present

## 2013-09-11 DIAGNOSIS — R630 Anorexia: Secondary | ICD-10-CM | POA: Diagnosis present

## 2013-09-11 DIAGNOSIS — L089 Local infection of the skin and subcutaneous tissue, unspecified: Secondary | ICD-10-CM | POA: Diagnosis not present

## 2013-09-11 DIAGNOSIS — N058 Unspecified nephritic syndrome with other morphologic changes: Secondary | ICD-10-CM | POA: Diagnosis present

## 2013-09-11 DIAGNOSIS — L0291 Cutaneous abscess, unspecified: Secondary | ICD-10-CM | POA: Diagnosis not present

## 2013-09-11 DIAGNOSIS — R4701 Aphasia: Secondary | ICD-10-CM | POA: Diagnosis not present

## 2013-09-11 DIAGNOSIS — I5043 Acute on chronic combined systolic (congestive) and diastolic (congestive) heart failure: Secondary | ICD-10-CM | POA: Diagnosis present

## 2013-09-11 DIAGNOSIS — G929 Unspecified toxic encephalopathy: Secondary | ICD-10-CM | POA: Diagnosis present

## 2013-09-11 DIAGNOSIS — E41 Nutritional marasmus: Secondary | ICD-10-CM | POA: Diagnosis present

## 2013-09-11 DIAGNOSIS — R4182 Altered mental status, unspecified: Secondary | ICD-10-CM | POA: Diagnosis not present

## 2013-09-11 LAB — PROTIME-INR
INR: 2.91 — ABNORMAL HIGH (ref 0.00–1.49)
Prothrombin Time: 29.4 seconds — ABNORMAL HIGH (ref 11.6–15.2)

## 2013-09-11 LAB — GLUCOSE, CAPILLARY
Glucose-Capillary: 167 mg/dL — ABNORMAL HIGH (ref 70–99)
Glucose-Capillary: 155 mg/dL — ABNORMAL HIGH (ref 70–99)

## 2013-09-11 NOTE — Progress Notes (Signed)
Clinical social worker assisted with patient discharge to skilled nursing facility, [name].  CSW addressed all family questions and concerns. CSW copied chart and added all important documents. CSW also set up patient transportation with [facility]. Clinical Social Worker will sign off for now as social work intervention is no longer needed.  Oscar Castillo, MSW, LCSWA [phone] 

## 2013-09-11 NOTE — Discharge Summary (Signed)
Physician Discharge Summary  Patient ID: Oscar Castillo MRN: [id number] DOB/AGE: [birth year] 63 y.o.  Admit date: 09/08/2013 Discharge date: 09/11/2013  Admission Diagnoses: Gangrene right foot  Discharge Diagnoses: Gangrene right foot Active Problems:   S/P BKA (below knee amputation) bilateral   Discharged Condition: stable  Hospital Course: Patient's hospital course was essentially unremarkable. Patient underwent a right transtibial amputation. Postoperatively patient received dialysis with renal consult. With the patient being a bilateral transtibial amputation patient will require discharge to skilled nursing.  Consults: nephrology  Significant Diagnostic Studies: labs: Routine labs  Treatments: dialysis: Hemodialysis and surgery: See operative note  Discharge Exam: Blood pressure 122/79, pulse 85, temperature 99.4 F (37.4 C), temperature source Oral, resp. rate 16, height 5\' 10"  (1.778 m), weight 68.947 kg (152 lb), SpO2 100.00%. Incision/Wound: dressing clean dry and intact  Disposition: 01-Home or Self Care  Discharge Orders   Future Appointments Provider Department Dept Phone   11/29/2013 4:30 PM [name], MD [facility] [phone]   Future Orders Complete By Expires   Call MD / Call 911  As directed    Comments:     If you experience chest pain or shortness of breath, CALL 911 and be transported to the hospital emergency room.  If you develope a fever above 101 F, pus (white drainage) or increased drainage or redness at the wound, or calf pain, call your surgeon's office.   Constipation Prevention  As directed    Comments:     Drink plenty of fluids.  Prune juice may be helpful.  You may use a stool softener, such as Colace (over the counter) 100 mg twice a day.  Use MiraLax (over the counter) for constipation as needed.   Diet - low sodium heart healthy  As directed    Increase activity slowly as tolerated  As directed         Medication List         acetaminophen 500 MG tablet  Commonly known as:  TYLENOL  Take 500 mg by mouth every 6 (six) hours as needed for mild pain.     amLODipine 10 MG tablet  Commonly known as:  NORVASC  Take 10 mg by mouth daily.     brimonidine 0.2 % ophthalmic solution  Commonly known as:  ALPHAGAN  Place 1 drop into both eyes 2 (two) times daily.     calcium acetate 667 MG capsule  Commonly known as:  PHOSLO  Take 2 capsules (1,334 mg total) by mouth 3 (three) times daily with meals.     docusate sodium 100 MG capsule  Commonly known as:  COLACE  Take 1 capsule (100 mg total) by mouth 2 (two) times daily.     gabapentin 300 MG capsule  Commonly known as:  NEURONTIN  Take 300 mg by mouth 2 (two) times daily as needed (for mild to moderate pain).     haloperidol 5 MG tablet  Commonly known as:  HALDOL  Take 5 mg by mouth at bedtime as needed for agitation.     HYDROcodone-acetaminophen 7.5-325 MG per tablet  Commonly known as:  NORCO  Take 1 tablet by mouth every 6 (six) hours as needed for moderate pain.     labetalol 300 MG tablet  Commonly known as:  NORMODYNE  Take 1 tablet (300 mg total) by mouth 3 (three) times daily.     LANTUS SOLOSTAR [place]  Inject 5 Units into the skin every evening.  sertraline 100 MG tablet  Commonly known as:  ZOLOFT  Take 100 mg by mouth daily as needed. For depression     silver sulfADIAZINE 1 % cream  Commonly known as:  SILVADENE  Apply 1 application topically daily.     SIMBRINZA 1-0.2 % Susp  Generic drug:  Brinzolamide-Brimonidine  Place 1 drop into both eyes 2 (two) times daily.     Travoprost (BAK Free) 0.004 % Soln ophthalmic solution  Commonly known as:  TRAVATAN  Place 1 drop into both eyes at bedtime.     traZODone 50 MG tablet  Commonly known as:  DESYREL  Take 50 mg by mouth at bedtime as needed for sleep.           Follow-up Information   Follow up with Bill Yohn V, MD In 2 weeks.   Specialty:   Orthopedic Surgery   Contact information:   [place] [address] [phone]       Signed: Newt Minion 09/11/2013, 6:32 AM

## 2013-09-11 NOTE — Progress Notes (Signed)
ANTICOAGULATION CONSULT NOTE - Follow up [place] for Warfarin Indication: VTE prophylaxis  No Known Allergies  Patient Measurements: Height: 5\' 10"  (177.8 cm) (prior to amputation) Weight: 152 lb (68.947 kg) IBW/kg (Calculated) : 73  Vital Signs: Temp: 99.6 F (37.6 C) (02/16 1306) Temp src: Oral (02/16 0520) BP: 117/75 mmHg (02/16 1306) Pulse Rate: 85 (02/16 1306)  Labs:  Recent Labs  09/09/13 0450 09/09/13 0818 09/10/13 0543 09/11/13 1024  HGB  --  10.7*  --   --   HCT  --  33.1*  --   --   PLT  --  300  --   --   LABPROT 14.3  --  17.1* 29.4*  INR 1.13  --  1.43 2.91*  CREATININE  --  9.52*  --   --     Estimated Creatinine Clearance: 7.8 ml/min (by C-G formula based on Cr of 9.52).   Assessment: 63 yo M admitted 09/08/2013  For R BKA.  Pharmacy managing warfarin for VTE prophylaxis. INR 1.43 > 2.91 after 3 doses of coumadin. No new cbc, No bleeding noted per chart. Plan for discharge to SNF.   Goal of Therapy:  INR 2-3 Monitor platelets by anticoagulation protocol: Yes   Plan:  -Hold coumadin today -Daily INR -Monitor for s/s of bleeding  Maryanna Shape, PharmD, BCPS  Clinical Pharmacist  Pager: [phone]

## 2013-09-11 NOTE — Progress Notes (Signed)
Patient d/c to SNF.  Report called to SNF. 

## 2013-09-11 NOTE — Care Management Note (Signed)
CARE MANAGEMENT NOTE 09/11/2013  Patient:  Oscar Castillo, Oscar Castillo   Account Number:  [id number]  Date Initiated:  09/11/2013  Documentation initiated by:  Ricki Miller  Subjective/Objective Assessment:   63 yr old male s/p right BKA     Action/Plan:   Patient is for shortterm rehab at [facility]. Wants to go to [employer], Education officer, museum is aware.   Anticipated DC Date:  09/11/2013   Anticipated DC Plan:  SKILLED NURSING FACILITY  In-house referral  Clinical Social Worker      DC Planning Services  CM consult      [facility] Choice  NA   Choice offered to / List presented to:             Status of service:  Completed, signed off Medicare Important Message given?   (If response is "NO", the following Medicare IM given date fields will be blank) Date Medicare IM given:   Date Additional Medicare IM given:    Discharge Disposition:  [place]

## 2013-09-12 ENCOUNTER — Encounter (HOSPITAL_COMMUNITY): Payer: Self-pay | Admitting: Orthopedic Surgery

## 2013-09-14 ENCOUNTER — Non-Acute Institutional Stay (SKILLED_NURSING_FACILITY): Payer: Medicare Other | Admitting: Adult Health

## 2013-09-14 ENCOUNTER — Encounter: Payer: Self-pay | Admitting: Adult Health

## 2013-09-14 DIAGNOSIS — E1029 Type 1 diabetes mellitus with other diabetic kidney complication: Secondary | ICD-10-CM

## 2013-09-14 DIAGNOSIS — F3289 Other specified depressive episodes: Secondary | ICD-10-CM

## 2013-09-14 DIAGNOSIS — I5042 Chronic combined systolic (congestive) and diastolic (congestive) heart failure: Secondary | ICD-10-CM | POA: Diagnosis not present

## 2013-09-14 DIAGNOSIS — S88119A Complete traumatic amputation at level between knee and ankle, unspecified lower leg, initial encounter: Secondary | ICD-10-CM

## 2013-09-14 DIAGNOSIS — F329 Major depressive disorder, single episode, unspecified: Secondary | ICD-10-CM

## 2013-09-14 DIAGNOSIS — N186 End stage renal disease: Secondary | ICD-10-CM | POA: Diagnosis not present

## 2013-09-14 DIAGNOSIS — I10 Essential (primary) hypertension: Secondary | ICD-10-CM

## 2013-09-14 DIAGNOSIS — Z992 Dependence on renal dialysis: Secondary | ICD-10-CM

## 2013-09-14 DIAGNOSIS — Z89512 Acquired absence of left leg below knee: Secondary | ICD-10-CM

## 2013-09-14 DIAGNOSIS — Z89511 Acquired absence of right leg below knee: Secondary | ICD-10-CM

## 2013-09-14 DIAGNOSIS — I509 Heart failure, unspecified: Secondary | ICD-10-CM

## 2013-09-14 NOTE — Progress Notes (Signed)
Patient ID: Oscar Castillo, male   DOB: [birth year], 63 y.o.   MRN: [id number]     [facility]  No Known Allergies   Chief Complaint  Patient presents with  . Hospitalization Follow-up    HPI:  He has been hospitalized for a right bka. He has had a left bka in Nov of 2014. He is here for short term rehab with his goal to return back home. He is on long term hemodialysis. He is not voicing any complaints. There are no concerns being voiced by the nursing staff at this time.    Past Medical History  Diagnosis Date  . ALLERGIC RHINITIS 05/05/2007  . BACK PAIN, LUMBAR, CHRONIC 08/06/2009  . BENIGN PROSTATIC HYPERTROPHY 08/01/2010  . CEREBROVASCULAR ACCIDENT, HX OF 08/06/2009  . CHOLELITHIASIS 08/01/2010  . CONGESTIVE HEART FAILURE 03/18/2009  . DEPRESSION 03/18/2009  . DIABETES MELLITUS, TYPE II 03/25/2007  . ERECTILE DYSFUNCTION 03/25/2007  . GERD 03/25/2007  . HEPATITIS C, HX OF 03/25/2007  . HYPERTENSION 03/25/2007  . Morbid obesity 03/25/2007  . NEPHROLITHIASIS, HX OF 03/18/2009  . ESRD on hemodialysis 09/08/2012    ESRD due to DM/HTN. Started dialysis in November 2013. Has LUA AVF placed Jun 14 2012 which is maturing as of Feb 2014.   [name] Renal insufficiency   . Complication of anesthesia     wife states pt had trouble waking up with his last surgery in Nov., 2014    Past Surgical History  Procedure Laterality Date  . Nephrectomy      partial RR  . Av fistula placement  06/14/2012    Procedure: ARTERIOVENOUS (AV) FISTULA CREATION;  Surgeon: Angelia Mould, MD;  Location: [facility] OR;  Service: Vascular;  Laterality: Left;  Left basilic vein transposition with fistula.  . Tibia im nail insertion Left 09/09/2012    Procedure: INTRAMEDULLARY (IM) NAIL TIBIAL;  Surgeon: Johnny Bridge, MD;  Location: [place];  Service: Orthopedics;  Laterality: Left;  left tibial nail and open reduction internal fixation left fibula fracture  . Orif fibula fracture Left 09/09/2012    Procedure: OPEN  REDUCTION INTERNAL FIXATION (ORIF) FIBULA FRACTURE;  Surgeon: Johnny Bridge, MD;  Location: [place];  Service: Orthopedics;  Laterality: Left;  . Colonoscopy N/A 10/28/2012    Procedure: COLONOSCOPY;  Surgeon: Jeryl Columbia, MD;  Location: [facility] ENDOSCOPY;  Service: Endoscopy;  Laterality: N/A;  . Esophagogastroduodenoscopy N/A 11/02/2012    Procedure: ESOPHAGOGASTRODUODENOSCOPY (EGD);  Surgeon: Cleotis Nipper, MD;  Location: [facility] END[facility]COPY;  Service: Endoscopy;  Laterality: N/A;  . Colonoscopy N/A 11/02/2012    Procedure: COLONOSCOPY;  Surgeon: Cleotis Nipper, MD;  Location: [facility] ENDOSCOPY;  Service: Endoscopy;  Laterality: N/A;  . Colonoscopy N/A 11/03/2012    Procedure: COLONOSCOPY;  Surgeon: Cleotis Nipper, MD;  Location: [facility] ENDOSCOPY;  Service: Endoscopy;  Laterality: N/A;  . Givens capsule study N/A 11/04/2012    Procedure: GIVENS CAPSULE STUDY;  Surgeon: Cleotis Nipper, MD;  Location: [facility] ENDOSCOPY;  Service: Endoscopy;  Laterality: N/A;  . Enteroscopy N/A 11/08/2012    Procedure: ENTEROSCOPY;  Surgeon: Wonda Horner, MD;  Location: [facility] ENDOSCOPY;  Service: Endoscopy;  Laterality: N/A;  . Amputation Left 05/12/2013    Procedure: AMPUTATION RAY;  Surgeon: Newt Minion, MD;  Location: [place];  Service: Orthopedics;  Laterality: Left;  Left Foot 1st Ray Amputation  . Eye surgery Left     to remove scar tissue  . Amputation Left 06/09/2013    Procedure:  AMPUTATION BELOW KNEE;  Surgeon: Newt Minion, MD;  Location: [place];  Service: Orthopedics;  Laterality: Left;  Left Below Knee Amputation and removal proximal screws IM tibial nail  . Hardware removal Left 06/09/2013    Procedure: HARDWARE REMOVAL;  Surgeon: Newt Minion, MD;  Location: [place];  Service: Orthopedics;  Laterality: Left;  Left Below Knee Amputation  and Removal proximal screws IM tibial nail  . Amputation Right 09/08/2013    Procedure: AMPUTATION BELOW KNEE;  Surgeon: Newt Minion, MD;  Location: [place];  Service: Orthopedics;   Laterality: Right;  Right Below Knee Amputation    VITAL SIGNS BP 120/74  Pulse 80  Ht 5\' 10"  (1.778 m)  Wt 151 lb (68.493 kg)  BMI 21.67 kg/m2   Patient's Medications  New Prescriptions   No medications on file  Previous Medications   ACETAMINOPHEN (TYLENOL) 500 MG TABLET    Take 500 mg by mouth every 6 (six) hours as needed for mild pain.   AMLODIPINE (NORVASC) 10 MG TABLET    Take 10 mg by mouth daily.   BRIMONIDINE (ALPHAGAN) 0.2 % OPHTHALMIC SOLUTION    Place 1 drop into both eyes 2 (two) times daily.   BRINZOLAMIDE-BRIMONIDINE (SIMBRINZA) 1-0.2 % SUSP    Place 1 drop into both eyes 2 (two) times daily.    CALCIUM ACETATE (PHOSLO) 667 MG CAPSULE    Take 2 capsules (1,334 mg total) by mouth 3 (three) times daily with meals.   DOCUSATE SODIUM (COLACE) 100 MG CAPSULE    Take 1 capsule (100 mg total) by mouth 2 (two) times daily.   GABAPENTIN (NEURONTIN) 300 MG CAPSULE    Take 300 mg by mouth 2 (two) times daily as needed (for mild to moderate pain).   HALOPERIDOL (HALDOL) 5 MG TABLET    Take 5 mg by mouth at bedtime as needed for agitation.   HYDROCODONE-ACETAMINOPHEN (NORCO) 7.5-325 MG PER TABLET    Take 1 tablet by mouth every 6 (six) hours as needed for moderate pain.   INSULIN GLARGINE (LANTUS SOLOSTAR [place])    Inject 5 Units into the skin every evening.   LABETALOL (NORMODYNE) 300 MG TABLET    Take 1 tablet (300 mg total) by mouth 3 (three) times daily.   SERTRALINE (ZOLOFT) 100 MG TABLET    Take 100 mg by mouth daily as needed. For depression   SILVER SULFADIAZINE (SILVADENE) 1 % CREAM    Apply 1 application topically daily.   TRAVOPROST, BAK FREE, (TRAVATAN) 0.004 % SOLN OPHTHALMIC SOLUTION    Place 1 drop into both eyes at bedtime.   TRAZODONE (DESYREL) 50 MG TABLET    Take 50 mg by mouth at bedtime as needed for sleep.  Modified Medications   No medications on file  Discontinued Medications   No medications on file    SIGNIFICANT DIAGNOSTIC EXAMS  NONE DURING THIS  HOSPITALIZATION   LABS REVIEWED:  09-08-13: wbc 9.4; hgb 12.3; hct 36.6; mcv 84.3; plt 319; glucose 181; bun 28; creat 8.2; k+4.2; na++141; liver normal albumin 3.3     Review of Systems  Constitutional: Negative for malaise/fatigue.  Respiratory: Negative for cough and shortness of breath.   Cardiovascular: Negative for chest pain and palpitations.  Gastrointestinal: Negative for heartburn, abdominal pain and constipation.  Musculoskeletal: Negative for joint pain and myalgias.  Skin: Negative.   Neurological: Negative for dizziness and weakness.  Psychiatric/Behavioral: Negative for depression. The patient is not nervous/anxious.     Physical  Exam  Constitutional: He is oriented to person, place, and time. He appears well-developed and well-nourished. No distress.  Neck: Neck supple. No JVD present.  Cardiovascular: Normal rate and regular rhythm.   Respiratory: Effort normal and breath sounds normal. No respiratory distress. He has no wheezes.  GI: Soft. Bowel sounds are normal. He exhibits no distension. There is no tenderness.  Musculoskeletal:  Is able to move extremities  Bilateral bka  Neurological: He is alert and oriented to person, place, and time.  Skin: Skin is warm and dry. He is not diaphoretic.  Right bka: without signs of infection present   Left upper arm a/v shunt: +thrill + bruit   Psychiatric: He has a normal mood and affect.      ASSESSMENT/ PLAN:   1. Hypertension: is stable will continue norvasc 10 mg daily; labetalol 300 mg three times daily and will continue to monitor his status.   2. ESRD on hemodialysis: is followed by nephrology is on hemodialysis three days per week; will continue 1200 cc fluid restriction; phoslo 667 (2) tabs with meals; and will continue to monitor her status   3. Diabetes; is stable will continue lantus 5 units nightly   4. Combined chronic heart failure: EF 45% is presently not on medications; will continue to monitor  his status.   5. Depression: is stable will continue zoloft 100 mg daily   6. Glaucoma: will continue simbrinzia to both eyes; travatan to both eyes; and alphagan to both eyes  7. Right bka: will continue therapy a indicated; will continue vicodin 7.5/325 mg every 6 hours as needed for pain and will continue neurontin 300 mg twice daily as needed  and will continue to monitor his status.   8. Constipation: will continue colace twice daily    Time spent with patient 50 minutes     Ok Edwards NP [facility]  Contact [phone] Monday through Friday 8am- 5pm  After hours call [phone]

## 2013-09-18 ENCOUNTER — Other Ambulatory Visit: Payer: Self-pay | Admitting: *Deleted

## 2013-09-18 ENCOUNTER — Encounter: Payer: Self-pay | Admitting: Internal Medicine

## 2013-09-18 ENCOUNTER — Non-Acute Institutional Stay (SKILLED_NURSING_FACILITY): Payer: Medicare Other | Admitting: Internal Medicine

## 2013-09-18 DIAGNOSIS — I10 Essential (primary) hypertension: Secondary | ICD-10-CM

## 2013-09-18 DIAGNOSIS — E1029 Type 1 diabetes mellitus with other diabetic kidney complication: Secondary | ICD-10-CM

## 2013-09-18 DIAGNOSIS — F3289 Other specified depressive episodes: Secondary | ICD-10-CM

## 2013-09-18 DIAGNOSIS — F329 Major depressive disorder, single episode, unspecified: Secondary | ICD-10-CM

## 2013-09-18 DIAGNOSIS — Z992 Dependence on renal dialysis: Secondary | ICD-10-CM

## 2013-09-18 DIAGNOSIS — R2681 Unsteadiness on feet: Secondary | ICD-10-CM

## 2013-09-18 DIAGNOSIS — L89109 Pressure ulcer of unspecified part of back, unspecified stage: Secondary | ICD-10-CM

## 2013-09-18 DIAGNOSIS — R269 Unspecified abnormalities of gait and mobility: Secondary | ICD-10-CM | POA: Diagnosis not present

## 2013-09-18 DIAGNOSIS — L98429 Non-pressure chronic ulcer of back with unspecified severity: Secondary | ICD-10-CM

## 2013-09-18 DIAGNOSIS — D62 Acute posthemorrhagic anemia: Secondary | ICD-10-CM

## 2013-09-18 DIAGNOSIS — I96 Gangrene, not elsewhere classified: Secondary | ICD-10-CM

## 2013-09-18 DIAGNOSIS — L8993 Pressure ulcer of unspecified site, stage 3: Secondary | ICD-10-CM

## 2013-09-18 DIAGNOSIS — N186 End stage renal disease: Secondary | ICD-10-CM

## 2013-09-18 MED ORDER — HYDROCODONE-ACETAMINOPHEN 7.5-325 MG PO TABS: 1.0000 | ORAL_TABLET | Freq: Four times a day (QID) | ORAL | Status: DC | PRN

## 2013-09-18 NOTE — Telephone Encounter (Signed)
[facility] 

## 2013-09-18 NOTE — Progress Notes (Signed)
Patient ID: Oscar Castillo, male   DOB: [birth year], 63 y.o.   MRN: [id number]     [facility]    PCP: [name], MD   No Known Allergies  Chief Complaint: new admit  HPI:  63 y/o male patient is here for STR after admission in the hospital from 09/08/13- 09/11/13 with gangrene in right foot. He underwent right transtibial amputation and is here for STR. Goal is to return home. He is seen in his room today. He had a mechanical fall yesterday while trying to get out of bed by himself. His pain is under control. Lactulose has been helpful with his bowel movement. He feels low and is tearful this visit.  Review of Systems:  Constitutional: Negative for fever, chills, weight loss, malaise/fatigue and diaphoresis.  HENT: Negative for congestion, hearing loss and sore throat.   Eyes: Negative for blurred vision, double vision and discharge.  Respiratory: Negative for cough, sputum production, shortness of breath and wheezing.   Cardiovascular: Negative for chest pain, palpitations, orthopnea  Gastrointestinal: Negative for heartburn, nausea, vomiting, abdominal pain Genitourinary: Negative for dysuria, urgency, frequency and flank pain.  Musculoskeletal: Negative for back pain, and myalgias.  Skin: Negative for itching and rash.  Neurological: Positive for weakness. Negative for dizziness, tingling, focal weakness and headaches.  Psychiatric/Behavioral: Negative for memory loss.   Past Medical History  Diagnosis Date  . ALLERGIC RHINITIS 05/05/2007  . BACK PAIN, LUMBAR, CHRONIC 08/06/2009  . BENIGN PROSTATIC HYPERTROPHY 08/01/2010  . CEREBROVASCULAR ACCIDENT, HX OF 08/06/2009  . CHOLELITHIASIS 08/01/2010  . CONGESTIVE HEART FAILURE 03/18/2009  . DEPRESSION 03/18/2009  . DIABETES MELLITUS, TYPE II 03/25/2007  . ERECTILE DYSFUNCTION 03/25/2007  . GERD 03/25/2007  . HEPATITIS C, HX OF 03/25/2007  . HYPERTENSION 03/25/2007  . Morbid obesity 03/25/2007  . NEPHROLITHIASIS, HX OF 03/18/2009    . ESRD on hemodialysis 09/08/2012    ESRD due to DM/HTN. Started dialysis in November 2013. Has LUA AVF placed Jun 14 2012 which is maturing as of Feb 2014.   [name] Renal insufficiency   . Complication of anesthesia     wife states pt had trouble waking up with his last surgery in Nov., 2014   Past Surgical History  Procedure Laterality Date  . Nephrectomy      partial RR  . Av fistula placement  06/14/2012    Procedure: ARTERIOVENOUS (AV) FISTULA CREATION;  Surgeon: Angelia Mould, MD;  Location: [facility] OR;  Service: Vascular;  Laterality: Left;  Left basilic vein transposition with fistula.  . Tibia im nail insertion Left 09/09/2012    Procedure: INTRAMEDULLARY (IM) NAIL TIBIAL;  Surgeon: Johnny Bridge, MD;  Location: [place];  Service: Orthopedics;  Laterality: Left;  left tibial nail and open reduction internal fixation left fibula fracture  . Orif fibula fracture Left 09/09/2012    Procedure: OPEN REDUCTION INTERNAL FIXATION (ORIF) FIBULA FRACTURE;  Surgeon: Johnny Bridge, MD;  Location: [place];  Service: Orthopedics;  Laterality: Left;  . Colonoscopy N/A 10/28/2012    Procedure: COLONOSCOPY;  Surgeon: Jeryl Columbia, MD;  Location: [facility] ENDOSCOPY;  Service: Endoscopy;  Laterality: N/A;  . Esophagogastroduodenoscopy N/A 11/02/2012    Procedure: ESOPHAGOGASTRODUODENOSCOPY (EGD);  Surgeon: Cleotis Nipper, MD;  Location: [facility] ENDOSCOPY;  Service: Endoscopy;  Laterality: N/A;  . Colonoscopy N/A 11/02/2012    Procedure: COLONOSCOPY;  Surgeon: Cleotis Nipper, MD;  Location: [facility] ENDOSCOPY;  Service: Endoscopy;  Laterality: N/A;  . Colonoscopy N/A 11/03/2012  Procedure: COLONOSCOPY;  Surgeon: Florencia Reasons, MD;  Location: [facility] ENDOSCOPY;  Service: Endoscopy;  Laterality: N/A;  . Givens capsule study N/A 11/04/2012    Procedure: GIVENS CAPSULE STUDY;  Surgeon: Florencia Reasons, MD;  Location: [facility] ENDOSCOPY;  Service: Endoscopy;  Laterality: N/A;  . Enteroscopy N/A 11/08/2012    Procedure: ENTEROSCOPY;   Surgeon: Graylin Shiver, MD;  Location: [facility] ENDOSCOPY;  Service: Endoscopy;  Laterality: N/A;  . Amputation Left 05/12/2013    Procedure: AMPUTATION RAY;  Surgeon: Nadara Mustard, MD;  Location: [facility] OR;  Service: Orthopedics;  Laterality: Left;  Left Foot 1st Ray Amputation  . Eye surgery Left     to remove scar tissue  . Amputation Left 06/09/2013    Procedure: AMPUTATION BELOW KNEE;  Surgeon: Nadara Mustard, MD;  Location: [facility] OR;  Service: Orthopedics;  Laterality: Left;  Left Below Knee Amputation and removal proximal screws IM tibial nail  . Hardware removal Left 06/09/2013    Procedure: HARDWARE REMOVAL;  Surgeon: Nadara Mustard, MD;  Location: [facility] OR;  Service: Orthopedics;  Laterality: Left;  Left Below Knee Amputation  and Removal proximal screws IM tibial nail  . Amputation Right 09/08/2013    Procedure: AMPUTATION BELOW KNEE;  Surgeon: Nadara Mustard, MD;  Location: [facility] OR;  Service: Orthopedics;  Laterality: Right;  Right Below Knee Amputation   Social History:   reports that he has never smoked. He has never used smokeless tobacco. He reports that he does not drink alcohol or use illicit drugs.  Family History  Problem Relation Age of Onset  . Coronary artery disease Other   . Diabetes Other     Medications: Patient's Medications  New Prescriptions   No medications on file  Previous Medications   ACETAMINOPHEN (TYLENOL) 500 MG TABLET    Take 500 mg by mouth every 6 (six) hours as needed for mild pain.   AMLODIPINE (NORVASC) 10 MG TABLET    Take 10 mg by mouth daily.   BRIMONIDINE (ALPHAGAN) 0.2 % OPHTHALMIC SOLUTION    Place 1 drop into both eyes 2 (two) times daily.   BRINZOLAMIDE-BRIMONIDINE (SIMBRINZA) 1-0.2 % SUSP    Place 1 drop into both eyes 2 (two) times daily.    CALCIUM ACETATE (PHOSLO) 667 MG CAPSULE    Take 2 capsules (1,334 mg total) by mouth 3 (three) times daily with meals.   DOCUSATE SODIUM (COLACE) 100 MG CAPSULE    Take 1 capsule (100 mg total) by mouth 2 (two)  times daily.   GABAPENTIN (NEURONTIN) 300 MG CAPSULE    Take 300 mg by mouth 2 (two) times daily as needed (for mild to moderate pain).   HALOPERIDOL (HALDOL) 5 MG TABLET    Take 5 mg by mouth at bedtime as needed for agitation.   HYDROCODONE-ACETAMINOPHEN (NORCO) 7.5-325 MG PER TABLET    Take 1 tablet by mouth every 6 (six) hours as needed for moderate pain.   INSULIN GLARGINE (LANTUS SOLOSTAR [place])    Inject 5 Units into the skin every evening.   LABETALOL (NORMODYNE) 300 MG TABLET    Take 1 tablet (300 mg total) by mouth 3 (three) times daily.   SERTRALINE (ZOLOFT) 100 MG TABLET    Take 100 mg by mouth daily as needed. For depression   SILVER SULFADIAZINE (SILVADENE) 1 % CREAM    Apply 1 application topically daily.   TRAVOPROST, BAK FREE, (TRAVATAN) 0.004 % SOLN OPHTHALMIC SOLUTION    Place 1 drop  into both eyes at bedtime.   TRAZODONE (DESYREL) 50 MG TABLET    Take 50 mg by mouth at bedtime as needed for sleep.  Modified Medications   No medications on file  Discontinued Medications   No medications on file     Physical Exam: BP 118/75  Pulse 80  Temp(Src) 99.4 F (37.4 C)  Resp 18  General- elderly male in no acute distress Head- atraumatic, normocephalic Eyes- PERRLA, EOMI, no pallor, no icterus, no discharge Cardiovascular- normal s1,s2, no murmurs/ rubs/ gallops Respiratory- bilateral clear to auscultation, no wheeze, no rhonchi, no crackles Abdomen- bowel sounds present, soft, non tender Musculoskeletal- able to move his upper extremities, bilateral BKA, no spinal and paraspinal tenderness Neurological- no focal deficit, normal reflexes, normal muscle strength, normal sensation to fine touch and vibration Skin- warm and dry. Surgical site healing well. Has left arm AV shunt with good thrill. Sacral stage 3 ulcer Psychiatry- alert and oriented to person, place and time, tearful and depressed  Labs reviewed: Basic Metabolic Panel:  Recent Labs  05/13/13 0705   06/10/13 0340 09/08/13 1152 09/09/13 0818  NA 129*  < > 134* 140 138  K 3.9  < > 4.4 4.2 4.5  CL 90*  < > 94* 94* 94*  CO2 26  < > 27 27 27   GLUCOSE 151*  < > 154* 181* 178*  BUN 29*  < > 27* 29* 40*  CREATININE 7.53*  < > 7.51* 8.20* 9.52*  CALCIUM 9.4  < > 9.3 10.4 9.8  PHOS 4.4  --  5.7*  --  5.7*  < > = values in this interval not displayed. Liver Function Tests:  Recent Labs  05/08/13 1806  06/09/13 1055 06/10/13 0340 09/08/13 1152 09/09/13 0818  AST 20  --  19  --  14  --   ALT 19  --  5  --  8  --   ALKPHOS 65  --  64  --  88  --   BILITOT 0.4  --  0.4  --  0.4  --   PROT 9.4*  --  9.3*  --  9.5*  --   ALBUMIN 3.3*  < > 2.9* 2.5* 3.3* 2.9*  < > = values in this interval not displayed. No results found for this basename: LIPASE, AMYLASE,  in the last 8760 hours  Recent Labs  10/28/12 0702  AMMONIA 17   CBC:  Recent Labs  05/08/13 1806  06/10/13 0340 09/08/13 1152 09/09/13 0818  WBC 7.3  < > 9.4 9.4 9.6  NEUTROABS 5.0  --   --   --   --   HGB 12.5*  < > 9.9* 12.3* 10.7*  HCT 39.5  < > 31.2* 36.6* 33.1*  MCV 72.7*  < > 78.6 84.3 85.1  PLT 398  < > 461* 319 300  < > = values in this interval not displayed. Cardiac Enzymes: No results found for this basename: CKTOTAL, CKMB, CKMBINDEX, TROPONINI,  in the last 8760 hours BNP: No components found with this basename: POCBNP,  CBG:  Recent Labs  09/10/13 2106 09/11/13 0659 09/11/13 1127  GLUCAP 180* 167* 155*     Assessment/Plan  Right foot gangrene- s/p right BKA. Here to undergo rehabilitation with PT and OT. Gait training, fall precautions and pain management to be provided. Continue prn vicodin and his standing neurontin. Continue wound care. Will continue vitamin c and zinc supplement and pressure ulcer prophylaxis. Has follow up with Dr  Duda  Unsteady gait- from b/l BKA. To work with therapy team for gait stability training. Fall precautions  Blood loss anemia- monitor h/h. No signs of  active bleed at present  Depression- will change his desyrel to 75 mg daily for now and monitor his mood   Hypertension- stable, continue norvasc 10 mg daily, labetalol 300 mg three times daily   ESRD- continue hemodialysis. Monitor clinically  Sacral stage 3 ulcer- pressure ulcer prophylaxis, has gel mattress. Continue vitamin c and zinc supplement  Diabetes mellitus- monitor cbg, continue lantus for now   Constipation- continue colace twice daily    Family/ staff Communication: reviewed care plan with patient and nursing supervisor   Goals of care: STR   Labs/tests ordered: pending lab at HD centre  [facility], MD  [place] [phone] (Monday-Friday 8 am - 5 pm) [phone] (afterhours)

## 2013-09-23 DIAGNOSIS — N186 End stage renal disease: Secondary | ICD-10-CM | POA: Diagnosis not present

## 2013-09-25 ENCOUNTER — Non-Acute Institutional Stay (SKILLED_NURSING_FACILITY): Payer: Medicare Other | Admitting: Adult Health

## 2013-09-25 ENCOUNTER — Other Ambulatory Visit: Payer: Self-pay | Admitting: Internal Medicine

## 2013-09-25 DIAGNOSIS — R627 Adult failure to thrive: Secondary | ICD-10-CM

## 2013-09-25 DIAGNOSIS — R410 Disorientation, unspecified: Secondary | ICD-10-CM

## 2013-09-25 DIAGNOSIS — N186 End stage renal disease: Secondary | ICD-10-CM

## 2013-09-25 DIAGNOSIS — Z992 Dependence on renal dialysis: Principal | ICD-10-CM

## 2013-09-25 DIAGNOSIS — F05 Delirium due to known physiological condition: Secondary | ICD-10-CM | POA: Diagnosis not present

## 2013-09-25 DIAGNOSIS — I6350 Cerebral infarction due to unspecified occlusion or stenosis of unspecified cerebral artery: Secondary | ICD-10-CM

## 2013-09-25 DIAGNOSIS — I639 Cerebral infarction, unspecified: Secondary | ICD-10-CM

## 2013-09-26 ENCOUNTER — Non-Acute Institutional Stay (SKILLED_NURSING_FACILITY): Payer: Medicare Other | Admitting: Adult Health

## 2013-09-26 ENCOUNTER — Other Ambulatory Visit: Payer: Medicare Other

## 2013-09-26 DIAGNOSIS — Z992 Dependence on renal dialysis: Secondary | ICD-10-CM

## 2013-09-26 DIAGNOSIS — N2581 Secondary hyperparathyroidism of renal origin: Secondary | ICD-10-CM | POA: Diagnosis not present

## 2013-09-26 DIAGNOSIS — I6350 Cerebral infarction due to unspecified occlusion or stenosis of unspecified cerebral artery: Secondary | ICD-10-CM | POA: Diagnosis not present

## 2013-09-26 DIAGNOSIS — D631 Anemia in chronic kidney disease: Secondary | ICD-10-CM | POA: Diagnosis not present

## 2013-09-26 DIAGNOSIS — F05 Delirium due to known physiological condition: Secondary | ICD-10-CM

## 2013-09-26 DIAGNOSIS — R2681 Unsteadiness on feet: Secondary | ICD-10-CM | POA: Insufficient documentation

## 2013-09-26 DIAGNOSIS — N186 End stage renal disease: Secondary | ICD-10-CM | POA: Diagnosis not present

## 2013-09-26 DIAGNOSIS — L98429 Non-pressure chronic ulcer of back with unspecified severity: Secondary | ICD-10-CM | POA: Insufficient documentation

## 2013-09-26 DIAGNOSIS — I639 Cerebral infarction, unspecified: Secondary | ICD-10-CM

## 2013-09-26 DIAGNOSIS — I96 Gangrene, not elsewhere classified: Secondary | ICD-10-CM | POA: Insufficient documentation

## 2013-09-28 ENCOUNTER — Encounter: Payer: Self-pay | Admitting: Adult Health

## 2013-09-28 DIAGNOSIS — D631 Anemia in chronic kidney disease: Secondary | ICD-10-CM | POA: Diagnosis not present

## 2013-09-28 DIAGNOSIS — R627 Adult failure to thrive: Secondary | ICD-10-CM | POA: Insufficient documentation

## 2013-09-28 DIAGNOSIS — F05 Delirium due to known physiological condition: Secondary | ICD-10-CM | POA: Insufficient documentation

## 2013-09-28 DIAGNOSIS — N2581 Secondary hyperparathyroidism of renal origin: Secondary | ICD-10-CM | POA: Diagnosis not present

## 2013-09-28 DIAGNOSIS — N186 End stage renal disease: Secondary | ICD-10-CM | POA: Diagnosis not present

## 2013-09-28 DIAGNOSIS — Z8673 Personal history of transient ischemic attack (TIA), and cerebral infarction without residual deficits: Secondary | ICD-10-CM | POA: Insufficient documentation

## 2013-09-28 NOTE — Progress Notes (Signed)
Patient ID: Oscar Castillo, male   DOB: 04/21/1951, 63 y.o.   MRN: [id number]     [facility]  No Known Allergies   Chief Complaint  Patient presents with  . Acute Visit    change in status     HPI:  Staff is concerned about his continued decline. He is confused; does not recognize family members. He is not eating well. He is unable to participate in the hpi or ros. The staff is concerned that he is declining despite every effort being made to improve upon his health state.    Past Medical History  Diagnosis Date  . ALLERGIC RHINITIS 05/05/2007  . BACK PAIN, LUMBAR, CHRONIC 08/06/2009  . BENIGN PROSTATIC HYPERTROPHY 08/01/2010  . CEREBROVASCULAR ACCIDENT, HX OF 08/06/2009  . CHOLELITHIASIS 08/01/2010  . CONGESTIVE HEART FAILURE 03/18/2009  . DEPRESSION 03/18/2009  . DIABETES MELLITUS, TYPE II 03/25/2007  . ERECTILE DYSFUNCTION 03/25/2007  . GERD 03/25/2007  . HEPATITIS C, HX OF 03/25/2007  . HYPERTENSION 03/25/2007  . Morbid obesity 03/25/2007  . NEPHROLITHIASIS, HX OF 03/18/2009  . ESRD on hemodialysis 09/08/2012    ESRD due to DM/HTN. Started dialysis in November 2013. Has LUA AVF placed Jun 14 2012 which is maturing as of Feb 2014.   [name] Renal insufficiency   . Complication of anesthesia     wife states pt had trouble waking up with his last surgery in Nov., 2014    Past Surgical History  Procedure Laterality Date  . Nephrectomy      partial RR  . Av fistula placement  06/14/2012    Procedure: ARTERIOVENOUS (AV) FISTULA CREATION;  Surgeon: Angelia Mould, MD;  Location: [facility] OR;  Service: Vascular;  Laterality: Left;  Left basilic vein transposition with fistula.  . Tibia im nail insertion Left 09/09/2012    Procedure: INTRAMEDULLARY (IM) NAIL TIBIAL;  Surgeon: Johnny Bridge, MD;  Location: [place];  Service: Orthopedics;  Laterality: Left;  left tibial nail and open reduction internal fixation left fibula fracture  . Orif fibula fracture Left 09/09/2012    Procedure: OPEN  REDUCTION INTERNAL FIXATION (ORIF) FIBULA FRACTURE;  Surgeon: Johnny Bridge, MD;  Location: [place];  Service: Orthopedics;  Laterality: Left;  . Colonoscopy N/A 10/28/2012    Procedure: COLONOSCOPY;  Surgeon: Jeryl Columbia, MD;  Location: [facility] ENDOSCOPY;  Service: Endoscopy;  Laterality: N/A;  . Esophagogastroduodenoscopy N/A 11/02/2012    Procedure: ESOPHAGOGASTRODUODENOSCOPY (EGD);  Surgeon: Cleotis Nipper, MD;  Location: [facility] ENDOSCOPY;  Service: Endoscopy;  Laterality: N/A;  . Colonoscopy N/A 11/02/2012    Procedure: COLONOSCOPY;  Surgeon: Cleotis Nipper, MD;  Location: [facility] ENDOSCOPY;  Service: Endoscopy;  Laterality: N/A;  . Colonoscopy N/A 11/03/2012    Procedure: COLONOSCOPY;  Surgeon: Cleotis Nipper, MD;  Location: [facility] ENDOSCOPY;  Service: Endoscopy;  Laterality: N/A;  . Givens capsule study N/A 11/04/2012    Procedure: GIVENS CAPSULE STUDY;  Surgeon: Cleotis Nipper, MD;  Location: [facility] ENDOSCOPY;  Service: Endoscopy;  Laterality: N/A;  . Enteroscopy N/A 11/08/2012    Procedure: ENTEROSCOPY;  Surgeon: Wonda Horner, MD;  Location: [facility] ENDOSCOPY;  Service: Endoscopy;  Laterality: N/A;  . Amputation Left 05/12/2013    Procedure: AMPUTATION RAY;  Surgeon: Newt Minion, MD;  Location: [place];  Service: Orthopedics;  Laterality: Left;  Left Foot 1st Ray Amputation  . Eye surgery Left     to remove scar tissue  . Amputation Left 06/09/2013    Procedure: AMPUTATION  BELOW KNEE;  Surgeon: Newt Minion, MD;  Location: [place];  Service: Orthopedics;  Laterality: Left;  Left Below Knee Amputation and removal proximal screws IM tibial nail  . Hardware removal Left 06/09/2013    Procedure: HARDWARE REMOVAL;  Surgeon: Newt Minion, MD;  Location: [place];  Service: Orthopedics;  Laterality: Left;  Left Below Knee Amputation  and Removal proximal screws IM tibial nail  . Amputation Right 09/08/2013    Procedure: AMPUTATION BELOW KNEE;  Surgeon: Newt Minion, MD;  Location: [place];  Service: Orthopedics;   Laterality: Right;  Right Below Knee Amputation    VITAL SIGNS BP 116/67  Pulse 70  Ht 4\' 8"  (1.422 m)  Wt 169 lb 11.2 oz (76.975 kg)  BMI 38.07 kg/m2   Patient's Medications  New Prescriptions   No medications on file  Previous Medications   ACETAMINOPHEN (TYLENOL) 500 MG TABLET    Take 500 mg by mouth every 6 (six) hours as needed for mild pain.   AMLODIPINE (NORVASC) 10 MG TABLET    Take 10 mg by mouth daily.   BRIMONIDINE (ALPHAGAN) 0.2 % OPHTHALMIC SOLUTION    Place 1 drop into both eyes 2 (two) times daily.   BRINZOLAMIDE-BRIMONIDINE (SIMBRINZA) 1-0.2 % SUSP    Place 1 drop into both eyes 2 (two) times daily.    CALCIUM ACETATE (PHOSLO) 667 MG CAPSULE    Take 2 capsules (1,334 mg total) by mouth 3 (three) times daily with meals.   DOCUSATE SODIUM (COLACE) 100 MG CAPSULE    Take 1 capsule (100 mg total) by mouth 2 (two) times daily.   GABAPENTIN (NEURONTIN) 300 MG CAPSULE    Take 300 mg by mouth daily as needed (for mild to moderate pain).    HALOPERIDOL (HALDOL) 5 MG TABLET    Take 5 mg by mouth at bedtime as needed for agitation.   HYDROCODONE-ACETAMINOPHEN (NORCO) 7.5-325 MG PER TABLET    Take 1 tablet by mouth every 6 (six) hours as needed for moderate pain.   INSULIN GLARGINE (LANTUS SOLOSTAR [name])    Inject 5 Units into the skin every evening.   LABETALOL (NORMODYNE) 300 MG TABLET    Take 1 tablet (300 mg total) by mouth 3 (three) times daily.   SERTRALINE (ZOLOFT) 100 MG TABLET    Take 100 mg by mouth daily as needed. For depression   SILVER SULFADIAZINE (SILVADENE) 1 % CREAM    Apply 1 application topically daily.   TRAVOPROST, BAK FREE, (TRAVATAN) 0.004 % SOLN OPHTHALMIC SOLUTION    Place 1 drop into both eyes at bedtime.   TRAZODONE (DESYREL) 50 MG TABLET    Take 50 mg by mouth at bedtime as needed for sleep.  Modified Medications   No medications on file  Discontinued Medications   No medications on file    SIGNIFICANT DIAGNOSTIC EXAMS  NONE DURING THIS  HOSPITALIZATION   LABS REVIEWED:  09-08-13: wbc 9.4; hgb 12.3; hct 36.6; mcv 84.3; plt 319; glucose 181; bun 28; creat 8.2; k+4.2; na++141; liver normal albumin 3.3     Review of Systems  Unable to perform    Physical Exam  Constitutional: He is oriented to person, place, and time. He appears well-developed and well-nourished. No distress.  Neck: Neck supple. No JVD present.  Cardiovascular: Normal rate and regular rhythm.   Respiratory: Effort normal and breath sounds normal. No respiratory distress. He has no wheezes.  GI: Soft. Bowel sounds are normal. He exhibits no distension.  There is no tenderness.  Musculoskeletal:  Is able to move extremities  Bilateral bka  Neurological: he is lethargic; knows name; is not oriented to surroundings.  Skin: Skin is warm and dry. He is not diaphoretic.  Right bka: without signs of infection present  Sacral wound: with slough present no signs of infection present.  Left upper arm a/v shunt: +thrill + bruit        ASSESSMENT/ PLAN:  1. ESRD on hemodialysis; FTT: CVA; confusion: will get a cbc; cmp; b12; folate; tsh; ua/c&s; and a ct of head. Will continue to monitor his status and will make changes as indicated.      Ok Edwards NP [facility]  Contact [phone] Monday through Friday 8am- 5pm  After hours call [phone]

## 2013-09-29 ENCOUNTER — Ambulatory Visit
Admission: RE | Admit: 2013-09-29 | Discharge: 2013-09-29 | Disposition: A | Discharge status: Home / Self Care | Payer: Medicare Other | Source: Ambulatory Visit | Attending: Internal Medicine | Admitting: Internal Medicine

## 2013-09-29 ENCOUNTER — Non-Acute Institutional Stay (SKILLED_NURSING_FACILITY): Payer: Medicare Other | Admitting: Adult Health

## 2013-09-29 DIAGNOSIS — L0291 Cutaneous abscess, unspecified: Secondary | ICD-10-CM

## 2013-09-29 DIAGNOSIS — T8781 Dehiscence of amputation stump: Secondary | ICD-10-CM

## 2013-09-29 DIAGNOSIS — R410 Disorientation, unspecified: Secondary | ICD-10-CM

## 2013-09-29 DIAGNOSIS — T8131XA Disruption of external operation (surgical) wound, not elsewhere classified, initial encounter: Secondary | ICD-10-CM | POA: Diagnosis not present

## 2013-09-29 DIAGNOSIS — R4182 Altered mental status, unspecified: Secondary | ICD-10-CM | POA: Diagnosis not present

## 2013-09-29 DIAGNOSIS — L0390 Cellulitis, unspecified: Secondary | ICD-10-CM

## 2013-09-29 DIAGNOSIS — F29 Unspecified psychosis not due to a substance or known physiological condition: Secondary | ICD-10-CM | POA: Diagnosis not present

## 2013-09-30 DIAGNOSIS — N186 End stage renal disease: Secondary | ICD-10-CM | POA: Diagnosis not present

## 2013-10-01 ENCOUNTER — Emergency Department (HOSPITAL_COMMUNITY): Payer: Medicare Other

## 2013-10-01 ENCOUNTER — Encounter (HOSPITAL_COMMUNITY): Payer: Self-pay | Admitting: Emergency Medicine

## 2013-10-01 ENCOUNTER — Inpatient Hospital Stay (HOSPITAL_COMMUNITY)
Admission: EM | Admit: 2013-10-01 | Discharge: 2013-10-31 | DRG: 474 | Disposition: A | Discharge status: Other Acute Inpatient Hospital | Payer: Medicare Other | Attending: Internal Medicine | Admitting: Internal Medicine

## 2013-10-01 DIAGNOSIS — G92 Toxic encephalopathy: Secondary | ICD-10-CM | POA: Diagnosis not present

## 2013-10-01 DIAGNOSIS — R509 Fever, unspecified: Secondary | ICD-10-CM | POA: Diagnosis present

## 2013-10-01 DIAGNOSIS — I5043 Acute on chronic combined systolic (congestive) and diastolic (congestive) heart failure: Secondary | ICD-10-CM | POA: Diagnosis present

## 2013-10-01 DIAGNOSIS — R627 Adult failure to thrive: Secondary | ICD-10-CM | POA: Diagnosis present

## 2013-10-01 DIAGNOSIS — Z6841 Body Mass Index (BMI) 40.0 and over, adult: Secondary | ICD-10-CM

## 2013-10-01 DIAGNOSIS — Z794 Long term (current) use of insulin: Secondary | ICD-10-CM

## 2013-10-01 DIAGNOSIS — E872 Acidosis, unspecified: Secondary | ICD-10-CM | POA: Diagnosis present

## 2013-10-01 DIAGNOSIS — N186 End stage renal disease: Secondary | ICD-10-CM | POA: Diagnosis present

## 2013-10-01 DIAGNOSIS — I5042 Chronic combined systolic (congestive) and diastolic (congestive) heart failure: Secondary | ICD-10-CM | POA: Diagnosis not present

## 2013-10-01 DIAGNOSIS — I447 Left bundle-branch block, unspecified: Secondary | ICD-10-CM | POA: Diagnosis present

## 2013-10-01 DIAGNOSIS — R64 Cachexia: Secondary | ICD-10-CM | POA: Diagnosis present

## 2013-10-01 DIAGNOSIS — Z515 Encounter for palliative care: Secondary | ICD-10-CM

## 2013-10-01 DIAGNOSIS — Z8679 Personal history of other diseases of the circulatory system: Secondary | ICD-10-CM

## 2013-10-01 DIAGNOSIS — L97809 Non-pressure chronic ulcer of other part of unspecified lower leg with unspecified severity: Secondary | ICD-10-CM | POA: Diagnosis present

## 2013-10-01 DIAGNOSIS — Z8249 Family history of ischemic heart disease and other diseases of the circulatory system: Secondary | ICD-10-CM

## 2013-10-01 DIAGNOSIS — I498 Other specified cardiac arrhythmias: Secondary | ICD-10-CM | POA: Diagnosis present

## 2013-10-01 DIAGNOSIS — E1069 Type 1 diabetes mellitus with other specified complication: Secondary | ICD-10-CM | POA: Diagnosis present

## 2013-10-01 DIAGNOSIS — E875 Hyperkalemia: Secondary | ICD-10-CM | POA: Diagnosis present

## 2013-10-01 DIAGNOSIS — R4701 Aphasia: Secondary | ICD-10-CM | POA: Diagnosis not present

## 2013-10-01 DIAGNOSIS — E870 Hyperosmolality and hypernatremia: Secondary | ICD-10-CM | POA: Diagnosis present

## 2013-10-01 DIAGNOSIS — N058 Unspecified nephritic syndrome with other morphologic changes: Secondary | ICD-10-CM | POA: Diagnosis present

## 2013-10-01 DIAGNOSIS — E1122 Type 2 diabetes mellitus with diabetic chronic kidney disease: Secondary | ICD-10-CM | POA: Diagnosis present

## 2013-10-01 DIAGNOSIS — F329 Major depressive disorder, single episode, unspecified: Secondary | ICD-10-CM | POA: Diagnosis not present

## 2013-10-01 DIAGNOSIS — R404 Transient alteration of awareness: Secondary | ICD-10-CM | POA: Diagnosis not present

## 2013-10-01 DIAGNOSIS — R9081 Abnormal echoencephalogram: Secondary | ICD-10-CM | POA: Diagnosis not present

## 2013-10-01 DIAGNOSIS — I639 Cerebral infarction, unspecified: Secondary | ICD-10-CM

## 2013-10-01 DIAGNOSIS — E43 Unspecified severe protein-calorie malnutrition: Secondary | ICD-10-CM

## 2013-10-01 DIAGNOSIS — G9341 Metabolic encephalopathy: Secondary | ICD-10-CM | POA: Diagnosis present

## 2013-10-01 DIAGNOSIS — F05 Delirium due to known physiological condition: Secondary | ICD-10-CM | POA: Diagnosis not present

## 2013-10-01 DIAGNOSIS — I70269 Atherosclerosis of native arteries of extremities with gangrene, unspecified extremity: Secondary | ICD-10-CM | POA: Diagnosis not present

## 2013-10-01 DIAGNOSIS — R4182 Altered mental status, unspecified: Secondary | ICD-10-CM | POA: Insufficient documentation

## 2013-10-01 DIAGNOSIS — I509 Heart failure, unspecified: Secondary | ICD-10-CM | POA: Diagnosis present

## 2013-10-01 DIAGNOSIS — L97909 Non-pressure chronic ulcer of unspecified part of unspecified lower leg with unspecified severity: Secondary | ICD-10-CM | POA: Diagnosis not present

## 2013-10-01 DIAGNOSIS — Z833 Family history of diabetes mellitus: Secondary | ICD-10-CM

## 2013-10-01 DIAGNOSIS — D638 Anemia in other chronic diseases classified elsewhere: Secondary | ICD-10-CM | POA: Diagnosis not present

## 2013-10-01 DIAGNOSIS — R0902 Hypoxemia: Secondary | ICD-10-CM | POA: Diagnosis present

## 2013-10-01 DIAGNOSIS — T8740 Infection of amputation stump, unspecified extremity: Principal | ICD-10-CM | POA: Diagnosis present

## 2013-10-01 DIAGNOSIS — R0602 Shortness of breath: Secondary | ICD-10-CM | POA: Diagnosis not present

## 2013-10-01 DIAGNOSIS — R4181 Age-related cognitive decline: Secondary | ICD-10-CM | POA: Diagnosis present

## 2013-10-01 DIAGNOSIS — M869 Osteomyelitis, unspecified: Secondary | ICD-10-CM | POA: Diagnosis not present

## 2013-10-01 DIAGNOSIS — I10 Essential (primary) hypertension: Secondary | ICD-10-CM

## 2013-10-01 DIAGNOSIS — R630 Anorexia: Secondary | ICD-10-CM | POA: Diagnosis present

## 2013-10-01 DIAGNOSIS — E1165 Type 2 diabetes mellitus with hyperglycemia: Secondary | ICD-10-CM | POA: Diagnosis not present

## 2013-10-01 DIAGNOSIS — Z992 Dependence on renal dialysis: Secondary | ICD-10-CM

## 2013-10-01 DIAGNOSIS — L8995 Pressure ulcer of unspecified site, unstageable: Secondary | ICD-10-CM | POA: Diagnosis present

## 2013-10-01 DIAGNOSIS — E785 Hyperlipidemia, unspecified: Secondary | ICD-10-CM | POA: Diagnosis present

## 2013-10-01 DIAGNOSIS — T8789 Other complications of amputation stump: Secondary | ICD-10-CM | POA: Diagnosis present

## 2013-10-01 DIAGNOSIS — N2581 Secondary hyperparathyroidism of renal origin: Secondary | ICD-10-CM | POA: Diagnosis present

## 2013-10-01 DIAGNOSIS — L02619 Cutaneous abscess of unspecified foot: Secondary | ICD-10-CM | POA: Diagnosis not present

## 2013-10-01 DIAGNOSIS — S88119A Complete traumatic amputation at level between knee and ankle, unspecified lower leg, initial encounter: Secondary | ICD-10-CM | POA: Diagnosis not present

## 2013-10-01 DIAGNOSIS — I739 Peripheral vascular disease, unspecified: Secondary | ICD-10-CM | POA: Diagnosis present

## 2013-10-01 DIAGNOSIS — K5900 Constipation, unspecified: Secondary | ICD-10-CM | POA: Diagnosis not present

## 2013-10-01 DIAGNOSIS — I120 Hypertensive chronic kidney disease with stage 5 chronic kidney disease or end stage renal disease: Secondary | ICD-10-CM | POA: Diagnosis present

## 2013-10-01 DIAGNOSIS — I6350 Cerebral infarction due to unspecified occlusion or stenosis of unspecified cerebral artery: Secondary | ICD-10-CM | POA: Diagnosis not present

## 2013-10-01 DIAGNOSIS — I5041 Acute combined systolic (congestive) and diastolic (congestive) heart failure: Secondary | ICD-10-CM | POA: Diagnosis not present

## 2013-10-01 DIAGNOSIS — E860 Dehydration: Secondary | ICD-10-CM | POA: Diagnosis not present

## 2013-10-01 DIAGNOSIS — R0609 Other forms of dyspnea: Secondary | ICD-10-CM | POA: Diagnosis not present

## 2013-10-01 DIAGNOSIS — K922 Gastrointestinal hemorrhage, unspecified: Secondary | ICD-10-CM

## 2013-10-01 DIAGNOSIS — Z905 Acquired absence of kidney: Secondary | ICD-10-CM

## 2013-10-01 DIAGNOSIS — L02419 Cutaneous abscess of limb, unspecified: Secondary | ICD-10-CM | POA: Diagnosis not present

## 2013-10-01 DIAGNOSIS — D62 Acute posthemorrhagic anemia: Secondary | ICD-10-CM | POA: Diagnosis not present

## 2013-10-01 DIAGNOSIS — L03119 Cellulitis of unspecified part of limb: Secondary | ICD-10-CM | POA: Diagnosis not present

## 2013-10-01 DIAGNOSIS — F3289 Other specified depressive episodes: Secondary | ICD-10-CM | POA: Diagnosis not present

## 2013-10-01 DIAGNOSIS — K219 Gastro-esophageal reflux disease without esophagitis: Secondary | ICD-10-CM | POA: Diagnosis present

## 2013-10-01 DIAGNOSIS — R0989 Other specified symptoms and signs involving the circulatory and respiratory systems: Secondary | ICD-10-CM | POA: Diagnosis not present

## 2013-10-01 DIAGNOSIS — L89109 Pressure ulcer of unspecified part of back, unspecified stage: Secondary | ICD-10-CM | POA: Diagnosis present

## 2013-10-01 DIAGNOSIS — Z89511 Acquired absence of right leg below knee: Secondary | ICD-10-CM

## 2013-10-01 DIAGNOSIS — L02415 Cutaneous abscess of right lower limb: Secondary | ICD-10-CM

## 2013-10-01 DIAGNOSIS — M25469 Effusion, unspecified knee: Secondary | ICD-10-CM | POA: Diagnosis not present

## 2013-10-01 DIAGNOSIS — E1129 Type 2 diabetes mellitus with other diabetic kidney complication: Secondary | ICD-10-CM | POA: Diagnosis not present

## 2013-10-01 DIAGNOSIS — Z4682 Encounter for fitting and adjustment of non-vascular catheter: Secondary | ICD-10-CM | POA: Diagnosis not present

## 2013-10-01 DIAGNOSIS — R935 Abnormal findings on diagnostic imaging of other abdominal regions, including retroperitoneum: Secondary | ICD-10-CM | POA: Diagnosis not present

## 2013-10-01 DIAGNOSIS — A419 Sepsis, unspecified organism: Secondary | ICD-10-CM | POA: Diagnosis present

## 2013-10-01 DIAGNOSIS — L98499 Non-pressure chronic ulcer of skin of other sites with unspecified severity: Secondary | ICD-10-CM | POA: Diagnosis present

## 2013-10-01 DIAGNOSIS — Z452 Encounter for adjustment and management of vascular access device: Secondary | ICD-10-CM | POA: Diagnosis not present

## 2013-10-01 DIAGNOSIS — M9080 Osteopathy in diseases classified elsewhere, unspecified site: Secondary | ICD-10-CM | POA: Diagnosis present

## 2013-10-01 DIAGNOSIS — I110 Hypertensive heart disease with heart failure: Secondary | ICD-10-CM | POA: Diagnosis present

## 2013-10-01 DIAGNOSIS — Z79899 Other long term (current) drug therapy: Secondary | ICD-10-CM

## 2013-10-01 DIAGNOSIS — Z87442 Personal history of urinary calculi: Secondary | ICD-10-CM

## 2013-10-01 DIAGNOSIS — Z8673 Personal history of transient ischemic attack (TIA), and cerebral infarction without residual deficits: Secondary | ICD-10-CM

## 2013-10-01 DIAGNOSIS — E1029 Type 1 diabetes mellitus with other diabetic kidney complication: Secondary | ICD-10-CM

## 2013-10-01 DIAGNOSIS — E41 Nutritional marasmus: Secondary | ICD-10-CM | POA: Diagnosis not present

## 2013-10-01 DIAGNOSIS — B1920 Unspecified viral hepatitis C without hepatic coma: Secondary | ICD-10-CM | POA: Diagnosis present

## 2013-10-01 DIAGNOSIS — G929 Unspecified toxic encephalopathy: Secondary | ICD-10-CM | POA: Diagnosis present

## 2013-10-01 DIAGNOSIS — Y835 Amputation of limb(s) as the cause of abnormal reaction of the patient, or of later complication, without mention of misadventure at the time of the procedure: Secondary | ICD-10-CM | POA: Diagnosis present

## 2013-10-01 DIAGNOSIS — I517 Cardiomegaly: Secondary | ICD-10-CM

## 2013-10-01 DIAGNOSIS — N400 Enlarged prostate without lower urinary tract symptoms: Secondary | ICD-10-CM | POA: Diagnosis present

## 2013-10-01 DIAGNOSIS — Z89512 Acquired absence of left leg below knee: Secondary | ICD-10-CM

## 2013-10-01 DIAGNOSIS — IMO0002 Reserved for concepts with insufficient information to code with codable children: Secondary | ICD-10-CM | POA: Diagnosis not present

## 2013-10-01 DIAGNOSIS — F4321 Adjustment disorder with depressed mood: Secondary | ICD-10-CM | POA: Diagnosis present

## 2013-10-01 DIAGNOSIS — Z602 Problems related to living alone: Secondary | ICD-10-CM

## 2013-10-01 DIAGNOSIS — B182 Chronic viral hepatitis C: Secondary | ICD-10-CM | POA: Diagnosis present

## 2013-10-01 HISTORY — DX: Dependence on renal dialysis: N18.6

## 2013-10-01 HISTORY — DX: Dependence on renal dialysis: Z99.2

## 2013-10-01 LAB — I-STAT CG4 LACTIC ACID, ED: LACTIC ACID, VENOUS: 2.1 mmol/L (ref 0.5–2.2)

## 2013-10-01 LAB — I-STAT CHEM 8, ED
BUN: 56 mg/dL — AB (ref 6–23)
CHLORIDE: 98 meq/L (ref 96–112)
Calcium, Ion: 1.09 mmol/L — ABNORMAL LOW (ref 1.13–1.30)
Creatinine, Ser: 7.4 mg/dL — ABNORMAL HIGH (ref 0.50–1.35)
Glucose, Bld: 369 mg/dL — ABNORMAL HIGH (ref 70–99)
HEMATOCRIT: 49 % (ref 39.0–52.0)
Hemoglobin: 16.7 g/dL (ref 13.0–17.0)
POTASSIUM: 4.7 meq/L (ref 3.7–5.3)
Sodium: 142 mEq/L (ref 137–147)
TCO2: 32 mmol/L (ref 0–100)

## 2013-10-01 LAB — PROTIME-INR
INR: 1.19 (ref 0.00–1.49)
Prothrombin Time: 14.8 seconds (ref 11.6–15.2)

## 2013-10-01 LAB — AMMONIA: AMMONIA: 29 umol/L (ref 11–60)

## 2013-10-01 LAB — COMPREHENSIVE METABOLIC PANEL
ALBUMIN: 3.4 g/dL — AB (ref 3.5–5.2)
ALT: 13 U/L (ref 0–53)
AST: 35 U/L (ref 0–37)
Alkaline Phosphatase: 75 U/L (ref 39–117)
BUN: 49 mg/dL — ABNORMAL HIGH (ref 6–23)
CALCIUM: 11.2 mg/dL — AB (ref 8.4–10.5)
CO2: 28 mEq/L (ref 19–32)
Chloride: 92 mEq/L — ABNORMAL LOW (ref 96–112)
Creatinine, Ser: 6.33 mg/dL — ABNORMAL HIGH (ref 0.50–1.35)
GFR calc non Af Amer: 8 mL/min — ABNORMAL LOW (ref >90)
GFR, EST AFRICAN AMERICAN: 10 mL/min — AB (ref >90)
GLUCOSE: 356 mg/dL — AB (ref 70–99)
Potassium: 4.9 mEq/L (ref 3.7–5.3)
SODIUM: 141 meq/L (ref 137–147)
Total Bilirubin: 0.5 mg/dL (ref 0.3–1.2)
Total Protein: 10.4 g/dL — ABNORMAL HIGH (ref 6.0–8.3)

## 2013-10-01 LAB — CBC WITH DIFFERENTIAL/PLATELET
BASOS PCT: 0 % (ref 0–1)
Basophils Absolute: 0 10*3/uL (ref 0.0–0.1)
EOS PCT: 0 % (ref 0–5)
Eosinophils Absolute: 0 10*3/uL (ref 0.0–0.7)
HCT: 41.3 % (ref 39.0–52.0)
Hemoglobin: 13.7 g/dL (ref 13.0–17.0)
LYMPHS ABS: 1.5 10*3/uL (ref 0.7–4.0)
Lymphocytes Relative: 12 % (ref 12–46)
MCH: 28.8 pg (ref 26.0–34.0)
MCHC: 33.2 g/dL (ref 30.0–36.0)
MCV: 86.8 fL (ref 78.0–100.0)
Monocytes Absolute: 1.9 10*3/uL — ABNORMAL HIGH (ref 0.1–1.0)
Monocytes Relative: 15 % — ABNORMAL HIGH (ref 3–12)
NEUTROS PCT: 73 % (ref 43–77)
Neutro Abs: 9.2 10*3/uL — ABNORMAL HIGH (ref 1.7–7.7)
Platelets: 262 10*3/uL (ref 150–400)
RBC: 4.76 MIL/uL (ref 4.22–5.81)
RDW: 18.7 % — AB (ref 11.5–15.5)
WBC: 12.6 10*3/uL — ABNORMAL HIGH (ref 4.0–10.5)

## 2013-10-01 LAB — GLUCOSE, CAPILLARY
GLUCOSE-CAPILLARY: 222 mg/dL — AB (ref 70–99)
Glucose-Capillary: 267 mg/dL — ABNORMAL HIGH (ref 70–99)

## 2013-10-01 LAB — PRO B NATRIURETIC PEPTIDE: Pro B Natriuretic peptide (BNP): 1397 pg/mL — ABNORMAL HIGH (ref 0–125)

## 2013-10-01 LAB — I-STAT ARTERIAL BLOOD GAS, ED
Acid-Base Excess: 9 mmol/L — ABNORMAL HIGH (ref 0.0–2.0)
Bicarbonate: 34.2 mEq/L — ABNORMAL HIGH (ref 20.0–24.0)
O2 Saturation: 100 %
PCO2 ART: 45.9 mmHg — AB (ref 35.0–45.0)
PO2 ART: 465 mmHg — AB (ref 80.0–100.0)
TCO2: 36 mmol/L (ref 0–100)
pH, Arterial: 7.48 — ABNORMAL HIGH (ref 7.350–7.450)

## 2013-10-01 LAB — TROPONIN I

## 2013-10-01 MED ORDER — ACETAMINOPHEN 650 MG RE SUPP
650.0000 mg | Freq: Once | RECTAL | Status: AC
  Administered 2013-10-01: 650 mg via RECTAL
  Filled 2013-10-01: qty 1

## 2013-10-01 MED ORDER — SODIUM CHLORIDE 0.9 % IV SOLN: 1.0000 g | Freq: Once | INTRAVENOUS | Status: DC

## 2013-10-01 MED ORDER — AZTREONAM 1 G IJ SOLR
1.0000 g | INTRAMUSCULAR | Status: DC
  Administered 2013-10-02 – 2013-10-07 (×6): 1 g via INTRAVENOUS
  Filled 2013-10-01 (×6): qty 1

## 2013-10-01 MED ORDER — SODIUM CHLORIDE 0.9 % IJ SOLN
3.0000 mL | Freq: Two times a day (BID) | INTRAMUSCULAR | Status: DC
  Administered 2013-10-01 – 2013-10-31 (×29): 3 mL via INTRAVENOUS

## 2013-10-01 MED ORDER — AZTREONAM 2 G IJ SOLR
2.0000 g | Freq: Once | INTRAMUSCULAR | Status: AC
  Administered 2013-10-01: 2 g via INTRAVENOUS
  Filled 2013-10-01: qty 2

## 2013-10-01 MED ORDER — METOPROLOL TARTRATE 1 MG/ML IV SOLN
5.0000 mg | Freq: Four times a day (QID) | INTRAVENOUS | Status: DC
  Administered 2013-10-01 – 2013-10-04 (×11): 5 mg via INTRAVENOUS
  Filled 2013-10-01 (×16): qty 5

## 2013-10-01 MED ORDER — SODIUM CHLORIDE 0.9 % IV SOLN
1000.0000 mL | Freq: Once | INTRAVENOUS | Status: AC
  Administered 2013-10-01: 1000 mL via INTRAVENOUS

## 2013-10-01 MED ORDER — VANCOMYCIN HCL IN DEXTROSE 750-5 MG/150ML-% IV SOLN
750.0000 mg | INTRAVENOUS | Status: DC
  Administered 2013-10-03 – 2013-10-12 (×4): 750 mg via INTRAVENOUS
  Filled 2013-10-01 (×10): qty 150

## 2013-10-01 MED ORDER — INSULIN ASPART 100 UNIT/ML ~~LOC~~ SOLN
0.0000 [IU] | SUBCUTANEOUS | Status: DC
  Administered 2013-10-01 – 2013-10-02 (×4): 3 [IU] via SUBCUTANEOUS
  Administered 2013-10-02 – 2013-10-05 (×14): 2 [IU] via SUBCUTANEOUS
  Administered 2013-10-05: 3 [IU] via SUBCUTANEOUS
  Administered 2013-10-05: 2 [IU] via SUBCUTANEOUS
  Administered 2013-10-05: 1 [IU] via SUBCUTANEOUS
  Administered 2013-10-06: 2 [IU] via SUBCUTANEOUS
  Administered 2013-10-06: 3 [IU] via SUBCUTANEOUS

## 2013-10-01 MED ORDER — ROCURONIUM BROMIDE 50 MG/5ML IV SOLN
INTRAVENOUS | Status: AC
  Filled 2013-10-01: qty 2

## 2013-10-01 MED ORDER — ETOMIDATE 2 MG/ML IV SOLN
INTRAVENOUS | Status: AC
  Filled 2013-10-01: qty 20

## 2013-10-01 MED ORDER — CALCIUM GLUCONATE 10 % IV SOLN
1.0000 g | Freq: Once | INTRAVENOUS | Status: AC
  Administered 2013-10-01: 1 g via INTRAVENOUS
  Filled 2013-10-01: qty 10

## 2013-10-01 MED ORDER — LIDOCAINE HCL (CARDIAC) 20 MG/ML IV SOLN
INTRAVENOUS | Status: AC
  Filled 2013-10-01: qty 5

## 2013-10-01 MED ORDER — VANCOMYCIN HCL IN DEXTROSE 1-5 GM/200ML-% IV SOLN
1000.0000 mg | Freq: Once | INTRAVENOUS | Status: AC
  Administered 2013-10-01: 1000 mg via INTRAVENOUS
  Filled 2013-10-01: qty 200

## 2013-10-01 MED ORDER — SUCCINYLCHOLINE CHLORIDE 20 MG/ML IJ SOLN
INTRAMUSCULAR | Status: AC
  Filled 2013-10-01: qty 1

## 2013-10-01 MED ORDER — HEPARIN SODIUM (PORCINE) 5000 UNIT/ML IJ SOLN
5000.0000 [IU] | Freq: Three times a day (TID) | INTRAMUSCULAR | Status: DC
  Administered 2013-10-01 – 2013-10-23 (×55): 5000 [IU] via SUBCUTANEOUS
  Filled 2013-10-01 (×68): qty 1

## 2013-10-01 MED ORDER — SODIUM CHLORIDE 0.9 % IV SOLN
1.0000 g | Freq: Once | INTRAVENOUS | Status: DC
  Filled 2013-10-01: qty 10

## 2013-10-01 NOTE — ED Notes (Signed)
Wife at bedside, patient alert and saying words to wife. Patient still disoriented. Patient saying the word strawberries to every question.

## 2013-10-01 NOTE — ED Notes (Signed)
Dr Mingo Amber at bedside talking with wife.

## 2013-10-01 NOTE — ED Notes (Signed)
Results of lactic acid shown to [place], [employer]

## 2013-10-01 NOTE — Progress Notes (Signed)
  Echocardiogram 2D Echocardiogram has been performed.  Oscar Castillo [name] 10/01/2013, 4:50 PM

## 2013-10-01 NOTE — Progress Notes (Signed)
ANTIBIOTIC CONSULT NOTE - INITIAL  Pharmacy Consult for Vancomycin and Aztreonam Indication: pneumonia  No Known Allergies  Patient Measurements: Weight: 169 lb 12.1 oz (77 kg)  Recent Labs  10/01/13 1136  HGB 16.7  CREATININE 7.40*   The CrCl is unknown because both a height and weight (above a minimum accepted value) are required for this calculation. No results found for this basename: VANCOTROUGH, VANCOPEAK, VANCORANDOM, GENTTROUGH, GENTPEAK, GENTRANDOM, TOBRATROUGH, TOBRAPEAK, TOBRARND, AMIKACINPEAK, AMIKACINTROU, AMIKACIN,  in the last 72 hours   Microbiology: No results found for this or any previous visit (from the past 720 hour(s)).  Medical History: Past Medical History  Diagnosis Date  . ALLERGIC RHINITIS 05/05/2007  . BACK PAIN, LUMBAR, CHRONIC 08/06/2009  . BENIGN PROSTATIC HYPERTROPHY 08/01/2010  . CEREBROVASCULAR ACCIDENT, HX OF 08/06/2009  . CHOLELITHIASIS 08/01/2010  . CONGESTIVE HEART FAILURE 03/18/2009  . DEPRESSION 03/18/2009  . DIABETES MELLITUS, TYPE II 03/25/2007  . ERECTILE DYSFUNCTION 03/25/2007  . GERD 03/25/2007  . HEPATITIS C, HX OF 03/25/2007  . HYPERTENSION 03/25/2007  . Morbid obesity 03/25/2007  . NEPHROLITHIASIS, HX OF 03/18/2009  . ESRD on hemodialysis 09/08/2012    ESRD due to DM/HTN. Started dialysis in November 2013. Has LUA AVF placed Jun 14 2012 which is maturing as of Feb 2014.   [name] Renal insufficiency   . Complication of anesthesia     wife states pt had trouble waking up with his last surgery in Nov., 2014    Medications:  See med list  Assessment: 63 year old male with recent admit in Feb 2015 for R gangrenous foot - now s/p R BKA.  Pt resident of nursing home, admitted now after being found unresponsive with food in mouth.  Tmax 101.3. Pt also with ESRD on dialysis Tu,Th, Sat.  Blood cx x2 drawn.  Goal of Therapy:  Vancomycin trough level 15-20 mcg/ml  Plan:  Vancomycin 1 g IV now then Vancomycin 750 mg IV qHD Aztreonam already  ordered as 2 g IV x1, will continue with 1 g IV q24h F/u blood culture, clinical status, dialysis schedule  Hughes Better, PharmD, BCPS Clinical Pharmacist Pager: [phone] 10/01/2013 11:52 AM

## 2013-10-01 NOTE — ED Notes (Signed)
LP tray setup at the bedside

## 2013-10-01 NOTE — ED Notes (Signed)
Patient presents to ED via EMS from [facility]. Per EMS wife found patient with food in mouth and unresponsive. EMS placed 20g PIV to rt FA, CBG 379, placed NRB mask.

## 2013-10-01 NOTE — ED Provider Notes (Signed)
CSN: [id number]     Arrival date & time 10/01/13  1126 History   First MD Initiated Contact with Patient 10/01/13 1130     Chief Complaint  Patient presents with  . Altered Mental Status     (Consider location/radiation/quality/duration/timing/severity/associated sxs/prior Treatment) Patient is a 63 y.o. male presenting with altered mental status. The history is provided by the patient and the EMS personnel.  Altered Mental Status Presenting symptoms: unresponsiveness   Severity:  Severe Most recent episode:  Today Episode history:  Single Timing:  Constant Progression:  Improving Chronicity:  New Context: nursing home resident, recent illness (stump infection, on antibiotics for 2 days) and recent infection   Context: not dementia, not drug use and not head injury   Associated symptoms: vomiting   Associated symptoms: no rash    Level V caveat - altered mental status  Past Medical History  Diagnosis Date  . ALLERGIC RHINITIS 05/05/2007  . BACK PAIN, LUMBAR, CHRONIC 08/06/2009  . BENIGN PROSTATIC HYPERTROPHY 08/01/2010  . CEREBROVASCULAR ACCIDENT, HX OF 08/06/2009  . CHOLELITHIASIS 08/01/2010  . CONGESTIVE HEART FAILURE 03/18/2009  . DEPRESSION 03/18/2009  . DIABETES MELLITUS, TYPE II 03/25/2007  . ERECTILE DYSFUNCTION 03/25/2007  . GERD 03/25/2007  . HEPATITIS C, HX OF 03/25/2007  . HYPERTENSION 03/25/2007  . Morbid obesity 03/25/2007  . NEPHROLITHIASIS, HX OF 03/18/2009  . ESRD on hemodialysis 09/08/2012    ESRD due to DM/HTN. Started dialysis in November 2013. Has LUA AVF placed Jun 14 2012 which is maturing as of Feb 2014.   [name] Renal insufficiency   . Complication of anesthesia     wife states pt had trouble waking up with his last surgery in Nov., 2014   Past Surgical History  Procedure Laterality Date  . Nephrectomy      partial RR  . Av fistula placement  06/14/2012    Procedure: ARTERIOVENOUS (AV) FISTULA CREATION;  Surgeon: Angelia Mould, MD;  Location: [facility] OR;   Service: Vascular;  Laterality: Left;  Left basilic vein transposition with fistula.  . Tibia im nail insertion Left 09/09/2012    Procedure: INTRAMEDULLARY (IM) NAIL TIBIAL;  Surgeon: Johnny Bridge, MD;  Location: [place];  Service: Orthopedics;  Laterality: Left;  left tibial nail and open reduction internal fixation left fibula fracture  . Orif fibula fracture Left 09/09/2012    Procedure: OPEN REDUCTION INTERNAL FIXATION (ORIF) FIBULA FRACTURE;  Surgeon: Johnny Bridge, MD;  Location: [place];  Service: Orthopedics;  Laterality: Left;  . Colonoscopy N/A 10/28/2012    Procedure: COLONOSCOPY;  Surgeon: Jeryl Columbia, MD;  Location: [facility] ENDOSCOPY;  Service: Endoscopy;  Laterality: N/A;  . Esophagogastroduodenoscopy N/A 11/02/2012    Procedure: ESOPHAGOGASTRODUODENOSCOPY (EGD);  Surgeon: Cleotis Nipper, MD;  Location: [facility] ENDOSCOPY;  Service: Endoscopy;  Laterality: N/A;  . Colonoscopy N/A 11/02/2012    Procedure: COLONOSCOPY;  Surgeon: Cleotis Nipper, MD;  Location: [facility] ENDOSCOPY;  Service: Endoscopy;  Laterality: N/A;  . Colonoscopy N/A 11/03/2012    Procedure: COLONOSCOPY;  Surgeon: Cleotis Nipper, MD;  Location: [facility] ENDOSCOPY;  Service: Endoscopy;  Laterality: N/A;  . Givens capsule study N/A 11/04/2012    Procedure: GIVENS CAPSULE STUDY;  Surgeon: Cleotis Nipper, MD;  Location: [facility] ENDOSCOPY;  Service: Endoscopy;  Laterality: N/A;  . Enteroscopy N/A 11/08/2012    Procedure: ENTEROSCOPY;  Surgeon: Wonda Horner, MD;  Location: [facility] ENDOSCOPY;  Service: Endoscopy;  Laterality: N/A;  . Amputation Left 05/12/2013    Procedure: AMPUTATION  RAY;  Surgeon: Newt Minion, MD;  Location: [place];  Service: Orthopedics;  Laterality: Left;  Left Foot 1st Ray Amputation  . Eye surgery Left     to remove scar tissue  . Amputation Left 06/09/2013    Procedure: AMPUTATION BELOW KNEE;  Surgeon: Newt Minion, MD;  Location: [place];  Service: Orthopedics;  Laterality: Left;  Left Below Knee Amputation and removal proximal  screws IM tibial nail  . Hardware removal Left 06/09/2013    Procedure: HARDWARE REMOVAL;  Surgeon: Newt Minion, MD;  Location: [place];  Service: Orthopedics;  Laterality: Left;  Left Below Knee Amputation  and Removal proximal screws IM tibial nail  . Amputation Right 09/08/2013    Procedure: AMPUTATION BELOW KNEE;  Surgeon: Newt Minion, MD;  Location: [place];  Service: Orthopedics;  Laterality: Right;  Right Below Knee Amputation   Family History  Problem Relation Age of Onset  . Coronary artery disease Other   . Diabetes Other    History  Substance Use Topics  . Smoking status: Never Smoker   . Smokeless tobacco: Never Used  . Alcohol Use: No    Review of Systems  Unable to perform ROS: Mental status change  Gastrointestinal: Positive for vomiting.  Skin: Negative for rash.      Allergies  Review of patient's allergies indicates no known allergies.  Home Medications   Current Outpatient Rx  Name  Route  Sig  Dispense  Refill  . acetaminophen (TYLENOL) 500 MG tablet   Oral   Take 500 mg by mouth every 6 (six) hours as needed for mild pain.         [name] amLODipine (NORVASC) 10 MG tablet   Oral   Take 10 mg by mouth daily.         . brimonidine (ALPHAGAN) 0.2 % ophthalmic solution   Both Eyes   Place 1 drop into both eyes 2 (two) times daily.         . Brinzolamide-Brimonidine (SIMBRINZA) 1-0.2 % SUSP   Both Eyes   Place 1 drop into both eyes 2 (two) times daily.          . calcium acetate (PHOSLO) 667 MG capsule   Oral   Take 2 capsules (1,334 mg total) by mouth 3 (three) times daily with meals.   180 capsule   0   . docusate sodium (COLACE) 100 MG capsule   Oral   Take 1 capsule (100 mg total) by mouth 2 (two) times daily.   10 capsule   0   . gabapentin (NEURONTIN) 300 MG capsule   Oral   Take 300 mg by mouth daily as needed (for mild to moderate pain).          . haloperidol (HALDOL) 5 MG tablet   Oral   Take 5 mg by mouth at bedtime as  needed for agitation.         [name] HYDROcodone-acetaminophen (NORCO) 7.5-325 MG per tablet   Oral   Take 1 tablet by mouth every 6 (six) hours as needed for moderate pain.   120 tablet   0   . Insulin Glargine (LANTUS SOLOSTAR [place])   Subcutaneous   Inject 5 Units into the skin every evening.         . labetalol (NORMODYNE) 300 MG tablet   Oral   Take 1 tablet (300 mg total) by mouth 3 (three) times daily.   90 tablet   6   .  sertraline (ZOLOFT) 100 MG tablet   Oral   Take 100 mg by mouth daily as needed. For depression         . silver sulfADIAZINE (SILVADENE) 1 % cream   Topical   Apply 1 application topically daily.         . Travoprost, BAK Free, (TRAVATAN) 0.004 % SOLN ophthalmic solution   Both Eyes   Place 1 drop into both eyes at bedtime.   1 Bottle   0   . traZODone (DESYREL) 50 MG tablet   Oral   Take 50 mg by mouth at bedtime as needed for sleep.          Wt 169 lb 12.1 oz (77 kg) Physical Exam  Nursing note and vitals reviewed. Constitutional: He is oriented to person, place, and time. He appears well-developed and well-nourished. No distress. He is not intubated.  HENT:  Head: Normocephalic and atraumatic.  Mouth/Throat: No oropharyngeal exudate.  Eyes: EOM are normal. Pupils are equal, round, and reactive to light.  Neck: Normal range of motion. Neck supple.  Cardiovascular: Normal rate and regular rhythm.  Exam reveals no friction rub.   No murmur heard. Pulmonary/Chest: Effort normal. No apnea. He is not intubated. No respiratory distress. He has decreased breath sounds (R sided). He has no wheezes. He has no rales.  Abdominal: He exhibits no distension. There is no tenderness. There is no rebound.  Musculoskeletal: Normal range of motion. He exhibits no edema.  Bilateral BKA R stump with lateral redness and mild open spot. No gross cellulitis or drainage  Neurological: He is alert and oriented to person, place, and time.  Skin: He is not  diaphoretic.       ED Course  Procedures (including critical care time) Labs Review Labs Reviewed  I-STAT CHEM 8, ED - Abnormal; Notable for the following:    BUN 56 (*)    Creatinine, Ser 7.40 (*)    Glucose, Bld 369 (*)    Calcium, Ion 1.09 (*)    All other components within normal limits  CULTURE, BLOOD (ROUTINE X 2)  CULTURE, BLOOD (ROUTINE X 2)  URINE CULTURE  CBC WITH DIFFERENTIAL  COMPREHENSIVE METABOLIC PANEL  URINALYSIS, ROUTINE W REFLEX MICROSCOPIC  BLOOD GAS, ARTERIAL  AMMONIA  I-STAT CG4 LACTIC ACID, ED   Imaging Review Ct Head Wo Contrast  09/29/2013   CLINICAL DATA:  Confusion/altered mental status  EXAM: CT HEAD WITHOUT CONTRAST  TECHNIQUE: Contiguous axial images were obtained from the base of the skull through the vertex without intravenous contrast.  COMPARISON:  Brain MRI February 26, 2013; brain CT August 23, 2007  FINDINGS: Mild diffuse atrophy is stable. There is no mass, hemorrhage, extra-axial fluid collection, or midline shift. Patchy small vessel disease throughout the centra semiovale bilaterally is stable. There is no new gray-white compartment lesion. There is no acute appearing infarct.  Bony calvarium appears intact.  The mastoid air cells are clear.  IMPRESSION: Atrophy with small vessel disease. No intracranial mass, hemorrhage, or acute appearing infarct.   Electronically Signed   By: Lowella Grip M.D.   On: 09/29/2013 14:24     EKG Interpretation None     CRITICAL CARE Performed by: Osvaldo Shipper   Total critical care time: 30 minutes  Critical care time was exclusive of separately billable procedures and treating other patients.  Critical care was necessary to treat or prevent imminent or life-threatening deterioration.  Critical care was time spent personally by me on  the following activities: development of treatment plan with patient and/or surrogate as well as nursing, discussions with consultants, evaluation of patient's  response to treatment, examination of patient, obtaining history from patient or surrogate, ordering and performing treatments and interventions, ordering and review of laboratory studies, ordering and review of radiographic studies, pulse oximetry and re-evaluation of patient's condition.  MDM   Final diagnoses:  Altered mental status  Fever  Sepsis    76M presents unresponsive from his nursing home. EMS states food found in mouth, unresponsive with wife. Initial BS 379. Patient fought against nasal airway, however unresponsive, taking shallow deep breaths. Had dialysis yesterday, full treatment. Had recent R BKA for gangrene of his foot. On initial eval, not responding to stimuli, makes incomprehensible sounds on vigorous stimulation.  Lungs with mild decreased sounds on the R. EKG with peaked T waves concerning for hyperkalemia. Calcium given for concerning EKG. Patient's CXR with mild possible RLL, but no diffuse consolidation. ABG shows 7.48/46/465 on NRB - no oxygenation or ventilation issues. Rectal temp 101. Will check ammonia, likely encephalopathic from sepsis. Will hold off on intubation immediately  Wife in the room states he's becoming increasingly altered over the past week. Today he was more unresponsive per wife. She states he's now improving with him moving his legs and his eyes are now open. She states recently Palliative Care has been discussed by the nursing home. Wife states he was put on antibiotics 2 days ago for his R stump infection. Head CT done 6 days ago for this was negative.  Labs returning, normal. Dr. Verlon Au admitting, requested LP. Unable to obtain CSF on my attempt, coordinated with IR for fluoro guided LP.  Osvaldo Shipper, MD 10/01/13 1600

## 2013-10-01 NOTE — Consult Note (Addendum)
Reason for Consult: New LBBB  Referring Physician: Dr. Nehemiah Settle is an 63 y.o. male.   HPI: The patient is an unfortunate 63 yo man with multiple medical problems who presented with altered mental status and new LBBB. He has severe peripheral vascular disease and has had to undergo multiple amputations and no has bilateral BKA. His wife reports that one of his stumps was infected. The patient has undergone hemodialysis T/H/S and was dialyzed yesterday. I do not have details. His wife who is with him notes that he has become progressively weaker with anorexia and an inability to feed himself. When he presented earlier today, he was obtunded and unresponsive. He has been cultured, given IV anti-biotics and a 2D echo by Dr. Acie Fredrickson reportedly shows normal LV function. He cannot provide any additional history but the spouse states that he has not had any pain complaints over the past few days.   PMH: Past Medical History  Diagnosis Date  . ALLERGIC RHINITIS 05/05/2007  . BACK PAIN, LUMBAR, CHRONIC 08/06/2009  . BENIGN PROSTATIC HYPERTROPHY 08/01/2010  . CEREBROVASCULAR ACCIDENT, HX OF 08/06/2009  . CHOLELITHIASIS 08/01/2010  . CONGESTIVE HEART FAILURE 03/18/2009  . DEPRESSION 03/18/2009  . DIABETES MELLITUS, TYPE II 03/25/2007  . ERECTILE DYSFUNCTION 03/25/2007  . GERD 03/25/2007  . HEPATITIS C, HX OF 03/25/2007  . HYPERTENSION 03/25/2007  . Morbid obesity 03/25/2007  . NEPHROLITHIASIS, HX OF 03/18/2009  . ESRD on hemodialysis 09/08/2012    ESRD due to DM/HTN. Started dialysis in November 2013. Has LUA AVF placed Jun 14 2012 which is maturing as of Feb 2014.   [name] Renal insufficiency   . Complication of anesthesia     wife states pt had trouble waking up with his last surgery in Nov., 2014    PSHX: Past Surgical History  Procedure Laterality Date  . Nephrectomy      partial RR  . Av fistula placement  06/14/2012    Procedure: ARTERIOVENOUS (AV) FISTULA CREATION;  Surgeon: Angelia Mould, MD;  Location: [facility] OR;  Service: Vascular;  Laterality: Left;  Left basilic vein transposition with fistula.  . Tibia im nail insertion Left 09/09/2012    Procedure: INTRAMEDULLARY (IM) NAIL TIBIAL;  Surgeon: Johnny Bridge, MD;  Location: [place];  Service: Orthopedics;  Laterality: Left;  left tibial nail and open reduction internal fixation left fibula fracture  . Orif fibula fracture Left 09/09/2012    Procedure: OPEN REDUCTION INTERNAL FIXATION (ORIF) FIBULA FRACTURE;  Surgeon: Johnny Bridge, MD;  Location: [place];  Service: Orthopedics;  Laterality: Left;  . Colonoscopy N/A 10/28/2012    Procedure: COLONOSCOPY;  Surgeon: Jeryl Columbia, MD;  Location: [facility] ENDOSCOPY;  Service: Endoscopy;  Laterality: N/A;  . Esophagogastroduodenoscopy N/A 11/02/2012    Procedure: ESOPHAGOGASTRODUODENOSCOPY (EGD);  Surgeon: Cleotis Nipper, MD;  Location: [facility] ENDOSCOPY;  Service: Endoscopy;  Laterality: N/A;  . Colonoscopy N/A 11/02/2012    Procedure: COLONOSCOPY;  Surgeon: Cleotis Nipper, MD;  Location: [facility] ENDOSCOPY;  Service: Endoscopy;  Laterality: N/A;  . Colonoscopy N/A 11/03/2012    Procedure: COLONOSCOPY;  Surgeon: Cleotis Nipper, MD;  Location: [facility] ENDOSCOPY;  Service: Endoscopy;  Laterality: N/A;  . Givens capsule study N/A 11/04/2012    Procedure: GIVENS CAPSULE STUDY;  Surgeon: Cleotis Nipper, MD;  Location: [facility] ENDOSCOPY;  Service: Endoscopy;  Laterality: N/A;  . Enteroscopy N/A 11/08/2012    Procedure: ENTEROSCOPY;  Surgeon: Wonda Horner, MD;  Location: [facility] ENDOSCOPY;  Service: Endoscopy;  Laterality: N/A;  . Amputation Left 05/12/2013    Procedure: AMPUTATION RAY;  Surgeon: Nadara Mustard, MD;  Location: [facility] OR;  Service: Orthopedics;  Laterality: Left;  Left Foot 1st Ray Amputation  . Eye surgery Left     to remove scar tissue  . Amputation Left 06/09/2013    Procedure: AMPUTATION BELOW KNEE;  Surgeon: Nadara Mustard, MD;  Location: [facility] OR;  Service: Orthopedics;  Laterality: Left;  Left Below Knee  Amputation and removal proximal screws IM tibial nail  . Hardware removal Left 06/09/2013    Procedure: HARDWARE REMOVAL;  Surgeon: Nadara Mustard, MD;  Location: [facility] OR;  Service: Orthopedics;  Laterality: Left;  Left Below Knee Amputation  and Removal proximal screws IM tibial nail  . Amputation Right 09/08/2013    Procedure: AMPUTATION BELOW KNEE;  Surgeon: Nadara Mustard, MD;  Location: [facility] OR;  Service: Orthopedics;  Laterality: Right;  Right Below Knee Amputation    FAMHX: Family History  Problem Relation Age of Onset  . Coronary artery disease Other   . Diabetes Other     Social History:  reports that he has never smoked. He has never used smokeless tobacco. He reports that he does not drink alcohol or use illicit drugs.  Allergies: No Known Allergies  Medications: reviewed  Ct Head Wo Contrast  10/01/2013   CLINICAL DATA:  Mental status change.  EXAM: CT HEAD WITHOUT CONTRAST  TECHNIQUE: Contiguous axial images were obtained from the base of the skull through the vertex without intravenous contrast.  COMPARISON:  CT HEAD W/O CM dated 09/29/2013  FINDINGS: There is mild stable diffuse cerebral and cerebellar atrophy with compensatory ventriculomegaly. There is no intracranial hemorrhage nor intracranial mass effect. There is no evidence of an evolving ischemic infarction. There is decreased density in the deep white matter of both cerebral hemispheres consistent with chronic small vessel ischemic type change.  At bone window settings there is hyperostosis frontalis interna. There is no evidence of an acute skull fracture. The observed portions of the paranasal sinuses and mastoid air cells are clear.  IMPRESSION: 1. There is no evidence of an acute intracranial hemorrhage nor of an evolving ischemic infarction. 2. There is stable diffuse atrophy with evidence of chronic small vessel ischemic change. 3. There is no intracranial mass effect. 4. There is no evidence of an acute skull fracture.    Electronically Signed   By: David  [place]   On: 10/01/2013 13:42   Dg Chest Port 1 View  10/01/2013   CLINICAL DATA:  Hypoxia, altered mental status.  EXAM: PORTABLE CHEST - 1 VIEW  COMPARISON:  DG CHEST 2 VIEW dated 06/09/2013  FINDINGS: The lungs are adequately inflated. There is no focal infiltrate. The cardiac silhouette is normal in size. The pulmonary vascularity is not engorged. The mediastinum is normal in width. There is no pleural effusion or pneumothorax. The observed portions of the bony thorax appear normal.  IMPRESSION: There is no evidence of pneumonia nor CHF or other acute cardiopulmonary disease.   Electronically Signed   By: David  [place]   On: 10/01/2013 11:52    ROS  As stated in the HPI and negative for all other systems.  Physical Exam  Vitals:Blood pressure 123/73, pulse 92, temperature 98.3 F (36.8 C), temperature source Oral, resp. rate 13, height 4\' 8"  (1.422 m), weight 159 lb 13.3 oz (72.5 kg), SpO2 98.00%.  ill appearing middle aged man, NAD HEENT: Unremarkable Neck:  7  cm JVD, no thyromegally Back:  No CVA tenderness Lungs:  Clear except for basilar rales HEART:  Regular rate rhythm, no murmurs, no rubs, no clicks Abd:  soft, positive bowel sounds, no organomegally, no rebound, no guarding Ext:  Bilateral BKA's, no drainage from either stump visualized through the bandage, left arm bruit present with no erythema. Skin:  No rashes no nodules Neuro:  Somnolent but awakens to verbal stimuli ECG -NSR with LBBB  Assessment/Plan: 1. Altered mental status 2. New LBBB 3. Likely underlying infection causing number 1 4. ESRD on HD 5. Severe vascular disease, s/p bilateral bka's. GYF:VCBS preserved LV function, new LBBB is of no consequence and we would not recommend additional cardiac workup at this time. He will likely need a prolonged course of anti-biotics. Consider ortho consult if stump is infected, as this might be source. Also, he is reported to have a  decubitus which could also be his source of infection. I did not examine his back side. Finally, his LBBB is likely a consequence of increased ventricular rate. Prior to today, his last ECG was in 2014.  Carleene Overlie TaylorMD 10/01/2013, 5:25 PM

## 2013-10-01 NOTE — H&P (Addendum)
[facility] History and Physical  NICOLAE DANN [id number] DOB: 1951-02-24 DOA: 10/01/2013  Referring physician: ED PCP: [name], MD  Specialists: Ortho  Chief Complaint: Toxic metabolic encephalopathy  HPI: Oscar Castillo is a 63 y.o. male known history end-stage renal disease November 2013 on HD-t/Th/Sat, diabetes mellitus, gangrene left toe status post amputation 04/2013, left leg BKA 05/2013, right leg BKA 08/2013, GI bleed 11/2012-nondiagnostic EGD, unprepped colonoscopy-capsule endoscopy =? Ulcer vs. mass small bowel, history left tibial nail and ORIF 08/2012, history chronic combined systolic/diastolic heart failure EF 45, history hepatitis C, prior lacunar CVA 08/23/07, BPH sent came to [facility]  10/01/2013 with Altered mental status Is unable to give a clear history however his wife says that patient was doing well until his post followup appointment with Dr. Sharol Given of orthopedics. He went to his office testing was removed but was becoming increasingly cool incoherent and was not really eating or drinking despite not eating. He would only want to teaspoons at a time and his wife noticed a change in his mental state By 09/24/13 he was pretty confused and would not work with therapy and a meeting was called on 09/26/13 because he was failing to thrive and nursing home staff as well as therapist thought that patient was depressed and had just given up in general on himself. He seemed to rally 3/6 and was eating a little more and was a little bit more coherent however this morning at 7:15 he was not responding at all would not take meds had his mouth shut completely. He had been treated since 09/29/13 with a potential wound on the stump where he had surgery recently he was given 2 antibiotics per report from wife and on review of the chart it looks like those were His food seem to cause the most cough recently per wife and on 09/28/13 the dysphagia pure his food.   He is very confused at the bedside and  not really able to give me a history and seems to be falling out of the bed Emergency room physician attempted lumbar puncture and interventional radiology will also attempt the same given his toxic metabolic encephalopathy.  Labs on admission-he and 36, creatinine 7.4, glucose 369, ionized calcium 1.09, total protein 10.4 Ammonia 29 Lactic acid 2.1 ProBNP 1397 down from a baseline of 6000 Hemoglobin 13.7 White count 12.6 Platelets 262 Portable chest x-ray negative for any acute cardiopulmonary disease CT head no evidence of intracranial hemorrhage nor evolving ischemic infarction stable diffuse atrophy   Review of Systems: The patient is completely obtunded and only response with hello  Past Medical History  Diagnosis Date  . ALLERGIC RHINITIS 05/05/2007  . BACK PAIN, LUMBAR, CHRONIC 08/06/2009  . BENIGN PROSTATIC HYPERTROPHY 08/01/2010  . CEREBROVASCULAR ACCIDENT, HX OF 08/06/2009  . CHOLELITHIASIS 08/01/2010  . CONGESTIVE HEART FAILURE 03/18/2009  . DEPRESSION 03/18/2009  . DIABETES MELLITUS, TYPE II 03/25/2007  . ERECTILE DYSFUNCTION 03/25/2007  . GERD 03/25/2007  . HEPATITIS C, HX OF 03/25/2007  . HYPERTENSION 03/25/2007  . Morbid obesity 03/25/2007  . NEPHROLITHIASIS, HX OF 03/18/2009  . ESRD on hemodialysis 09/08/2012    ESRD due to DM/HTN. Started dialysis in November 2013. Has LUA AVF placed Jun 14 2012 which is maturing as of Feb 2014.   [name] Renal insufficiency   . Complication of anesthesia     wife states pt had trouble waking up with his last surgery in Nov., 2014   Past Surgical History  Procedure Laterality Date  .  Nephrectomy      partial RR  . Av fistula placement  06/14/2012    Procedure: ARTERIOVENOUS (AV) FISTULA CREATION;  Surgeon: Angelia Mould, MD;  Location: [facility] OR;  Service: Vascular;  Laterality: Left;  Left basilic vein transposition with fistula.  . Tibia im nail insertion Left 09/09/2012    Procedure: INTRAMEDULLARY (IM) NAIL TIBIAL;  Surgeon: Johnny Bridge, MD;  Location: [facility];  Service: Orthopedics;  Laterality: Left;  left tibial nail and open reduction internal fixation left fibula fracture  . Orif fibula fracture Left 09/09/2012    Procedure: OPEN REDUCTION INTERNAL FIXATION (ORIF) FIBULA FRACTURE;  Surgeon: Johnny Bridge, MD;  Location: [place];  Service: Orthopedics;  Laterality: Left;  . Colonoscopy N/A 10/28/2012    Procedure: COLONOSCOPY;  Surgeon: Jeryl Columbia, MD;  Location: [facility] ENDOSCOPY;  Service: Endoscopy;  Laterality: N/A;  . Esophagogastroduodenoscopy N/A 11/02/2012    Procedure: ESOPHAGOGASTRODUODENOSCOPY (EGD);  Surgeon: Cleotis Nipper, MD;  Location: [facility] ENDOSCOPY;  Service: Endoscopy;  Laterality: N/A;  . Colonoscopy N/A 11/02/2012    Procedure: COLONOSCOPY;  Surgeon: Cleotis Nipper, MD;  Location: [facility] ENDOSCOPY;  Service: Endoscopy;  Laterality: N/A;  . Colonoscopy N/A 11/03/2012    Procedure: COLONOSCOPY;  Surgeon: Cleotis Nipper, MD;  Location: [facility] ENDOSCOPY;  Service: Endoscopy;  Laterality: N/A;  . Givens capsule study N/A 11/04/2012    Procedure: GIVENS CAPSULE STUDY;  Surgeon: Cleotis Nipper, MD;  Location: [facility] ENDOSCOPY;  Service: Endoscopy;  Laterality: N/A;  . Enteroscopy N/A 11/08/2012    Procedure: ENTEROSCOPY;  Surgeon: Wonda Horner, MD;  Location: [facility] ENDOSCOPY;  Service: Endoscopy;  Laterality: N/A;  . Amputation Left 05/12/2013    Procedure: AMPUTATION RAY;  Surgeon: Newt Minion, MD;  Location: [place];  Service: Orthopedics;  Laterality: Left;  Left Foot 1st Ray Amputation  . Eye surgery Left     to remove scar tissue  . Amputation Left 06/09/2013    Procedure: AMPUTATION BELOW KNEE;  Surgeon: Newt Minion, MD;  Location: [place];  Service: Orthopedics;  Laterality: Left;  Left Below Knee Amputation and removal proximal screws IM tibial nail  . Hardware removal Left 06/09/2013    Procedure: HARDWARE REMOVAL;  Surgeon: Newt Minion, MD;  Location: [place];  Service: Orthopedics;  Laterality: Left;  Left Below  Knee Amputation  and Removal proximal screws IM tibial nail  . 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 Coronary artery disease Other   . Diabetes Other      Prior to Admission medications   Medication Sig Start Date End Date Taking? Authorizing Provider  acetaminophen (TYLENOL) 500 MG tablet Take 500 mg by mouth every 6 (six) hours as needed for mild pain.    Historical Provider, MD  amLODipine (NORVASC) 10 MG tablet Take 10 mg by mouth daily.    Historical Provider, MD  brimonidine (ALPHAGAN) 0.2 % ophthalmic solution Place 1 drop into both eyes 2 (two) times daily.    Historical Provider, MD  Brinzolamide-Brimonidine [facility]) 1-0.2 % SUSP Place 1 drop into both  eyes 2 (two) times daily.     Historical Provider, MD  calcium acetate (PHOSLO) 667 MG capsule Take 2 capsules (1,334 mg total) by mouth 3 (three) times daily with meals. 06/21/12   Sorin June Leap, MD  docusate sodium (COLACE) 100 MG capsule Take 1 capsule (100 mg total) by mouth 2 (two) times daily. 05/15/13   Bobby Rumpf York, PA-C  gabapentin (NEURONTIN) 300 MG capsule Take 300 mg by mouth daily as needed (for mild to moderate pain).     Historical Provider, MD  haloperidol (HALDOL) 5 MG tablet Take 5 mg by mouth at bedtime as needed for agitation.    Historical Provider, MD  HYDROcodone-acetaminophen (NORCO) 7.5-325 MG per tablet Take 1 tablet by mouth every 6 (six) hours as needed for moderate pain. 09/18/13   Tiffany L Reed, DO  Insulin Glargine (LANTUS SOLOSTAR [place]) Inject 5 Units into the skin every evening.    Historical Provider, MD  labetalol (NORMODYNE) 300 MG tablet  Take 1 tablet (300 mg total) by mouth 3 (three) times daily. 05/15/13   Bobby Rumpf York, PA-C  sertraline (ZOLOFT) 100 MG tablet Take 100 mg by mouth daily as needed. For depression    Historical Provider, MD  silver sulfADIAZINE (SILVADENE) 1 % cream Apply 1 application topically daily.    Historical Provider, MD  Travoprost, BAK Free, (TRAVATAN) 0.004 % SOLN ophthalmic solution Place 1 drop into both eyes at bedtime. 10/30/12   Belkys A Harrel Carina, MD  traZODone (DESYREL) 50 MG tablet Take 50 mg by mouth at bedtime as needed for sleep.    Historical Provider, MD   Physical Exam: Filed Vitals:   10/01/13 1200 10/01/13 1206 10/01/13 1215 10/01/13 1230  BP: 152/86 130/81 131/83 125/81  Pulse: 99 97 97   Temp:  101.3 F (38.5 C)    TempSrc:  Oral    Resp: 20 18 23    Weight:      SpO2: 100% 100% 100%      General:  Frail, temporalis wasting, disoriented  Eyes: No pallor no icterus  ENT: Soft supple possible JVD  Neck: Cannot appreciate bruit  Cardiovascular: S1-S2 mildly tachycardic regular rate rhythm  Respiratory: Clinically clear  Abdomen: Soft nontender nondistended, some penile excoriation without discharge  Skin:      Musculoskeletal: Cannot assess completely  Psychiatric: Obtunded  Neurologic: Purposeless movement  Labs on Admission:  Basic Metabolic Panel:  Recent Labs Lab 10/01/13 1135 10/01/13 1136  NA 141 142  K 4.9 4.7  CL 92* 98  CO2 28  --   GLUCOSE 356* 369*  BUN 49* 56*  CREATININE 6.33* 7.40*  CALCIUM 11.2*  --    Liver Function Tests:  Recent Labs Lab 10/01/13 1135  AST 35  ALT 13  ALKPHOS 75  BILITOT 0.5  PROT 10.4*  ALBUMIN 3.4*   No results found for this basename: LIPASE, AMYLASE,  in the last 168 hours  Recent Labs Lab 10/01/13 1201  AMMONIA 29   CBC:  Recent Labs Lab 10/01/13 1135 10/01/13 1136  WBC 12.6*  --   NEUTROABS 9.2*  --   HGB 13.7 16.7  HCT 41.3 49.0  MCV 86.8  --   PLT 262  --    Cardiac  Enzymes:  Recent Labs Lab 10/01/13 1135  TROPONINI <0.30    BNP (last 3 results)  Recent Labs  10/01/13 1135  PROBNP 1397.0*   CBG: No results found for this basename: GLUCAP,  in the last 168  hours  Radiological Exams on Admission: Ct Head Wo Contrast  10/01/2013   CLINICAL DATA:  Mental status change.  EXAM: CT HEAD WITHOUT CONTRAST  TECHNIQUE: Contiguous axial images were obtained from the base of the skull through the vertex without intravenous contrast.  COMPARISON:  CT HEAD W/O CM dated 09/29/2013  FINDINGS: There is mild stable diffuse cerebral and cerebellar atrophy with compensatory ventriculomegaly. There is no intracranial hemorrhage nor intracranial mass effect. There is no evidence of an evolving ischemic infarction. There is decreased density in the deep white matter of both cerebral hemispheres consistent with chronic small vessel ischemic type change.  At bone window settings there is hyperostosis frontalis interna. There is no evidence of an acute skull fracture. The observed portions of the paranasal sinuses and mastoid air cells are clear.  IMPRESSION: 1. There is no evidence of an acute intracranial hemorrhage nor of an evolving ischemic infarction. 2. There is stable diffuse atrophy with evidence of chronic small vessel ischemic change. 3. There is no intracranial mass effect. 4. There is no evidence of an acute skull fracture.   Electronically Signed   By: David  Martinique   On: 10/01/2013 13:42   Ct Head Wo Contrast  09/29/2013   CLINICAL DATA:  Confusion/altered mental status  EXAM: CT HEAD WITHOUT CONTRAST  TECHNIQUE: Contiguous axial images were obtained from the base of the skull through the vertex without intravenous contrast.  COMPARISON:  Brain MRI February 26, 2013; brain CT August 23, 2007  FINDINGS: Mild diffuse atrophy is stable. There is no mass, hemorrhage, extra-axial fluid collection, or midline shift. Patchy small vessel disease throughout the centra semiovale  bilaterally is stable. There is no new gray-white compartment lesion. There is no acute appearing infarct.  Bony calvarium appears intact.  The mastoid air cells are clear.  IMPRESSION: Atrophy with small vessel disease. No intracranial mass, hemorrhage, or acute appearing infarct.   Electronically Signed   By: Lowella Grip M.D.   On: 09/29/2013 14:24   Dg Chest Port 1 View  10/01/2013   CLINICAL DATA:  Hypoxia, altered mental status.  EXAM: PORTABLE CHEST - 1 VIEW  COMPARISON:  DG CHEST 2 VIEW dated 06/09/2013  FINDINGS: The lungs are adequately inflated. There is no focal infiltrate. The cardiac silhouette is normal in size. The pulmonary vascularity is not engorged. The mediastinum is normal in width. There is no pleural effusion or pneumothorax. The observed portions of the bony thorax appear normal.  IMPRESSION: There is no evidence of pneumonia nor CHF or other acute cardiopulmonary disease.   Electronically Signed   By: David  Martinique   On: 10/01/2013 11:52    EKG: Independently reviewed. It wide-complex tachycardia, PR interval 0 point 08, left bundle branch block noted with T-wave inversions across precordial leads and across anterior leads-compared to prior this is new.  Assessment/Plan Principal Problem:   Encephalopathy, toxic--patient presents with fever, elevated white count, mild lactic acidosis 2.02, and history of at least 2 weeks waxing waning mentation-he was started about 3 days ago on antibiotics ? doxycycline 100 twice a day,    He is on Norco every 6 when necessary, gabapentin 300 daily , Haldol 5 each bedtime, trazodone 50 each bedtime sleep Zoloft 100 when necessary depression, all of which have been discontinued given his encephalopathic state however this would not explain his fever and white count.  Emergency room physician has already attempted lumbar puncture and he will be set up with interventional radiology to get emergent  lumbar puncture-at this stage this is  probably infectious I am not sure if he has meningitis and it is doubtful-he also has concerns for potential aspiration as his diet was downgraded to a pured diet ~5 days ago. He does occasionally cough when he eats.  Repeat a chest x-ray in the morning. I would not pursue a urinalysis on him as he only produces a little urine He does have a sacral decubitus as noted above which does not appear grossly infected at this time If his fever or mentation does not improve, I would speak with orthopedics surgery and consider MRI of the stump to rule out infection there-I have deferred ordering an is CT/MRI of the area for now We will monitor closely on step down unit and I have already alerted critical care medicine to reassess the patient if patient decompensates clinically Active Problems:   New-onset left bundle branch block with significant T wave inversions-risk for ?NSTEMI. I discussed in person with cardiologist Dr. Arliss Journey concurs that this needs workup. We will cycle cardiac enzymes although in the setting of renal failure and not sure how to interpret them. EKG in the morning and echocardiogram stat ordered .    End-stage renal disease on Tuesday, Thursday, Saturday dialysis-will contact nephrology for assistance. Had dialysis yesterday without event-will contact nephrology for routine dialysis   Acute combined systolic and diastolic heart failure, NYHA class 2-EF 45%-   HEPATITIS C, HX OF-unclear if ever treated. Will investigate   CEREBROVASCULAR ACCIDENT, HX OF-2009. Hold oral medications for now   CHOLELITHIASIS-history of   Hyperlipidemia-hold statin   GI bleed-monitor CBC   DIABETES mellitus-hold insulin in setting of not eating. Sliding scale coverage if sugar above 140 as per ICU guidelines   FTT (failure to thrive) in adult-patient has had 3 separate major surgeries in the setting of multiple organ illness and palliative medicine has already spoken to the patient in the outpatient  setting. If he does not improve meaningfully we will continue discussions with patient's family   40 minutes Long discussion with wife at bedside who confirms full CODE STATUS but is aware that outcome is guarded. We will attempt further discussions in the morning Step down status-critical care made aware, have consulted nephrology for routine dialysis, cardiology aware of patient  History of present high complexity medical decision-making in the setting of multiorgan illness and I have consulted multiple subspecialists to help with medical decision-making in this case.  Over one hour of critical care time in coordination and 40 minutes in direct patient care   [place], [facility] Pager [phone]   If 7PM-7AM, please contact night-coverage [email or web address] Password [email or web address] 10/01/2013, 1:46 PM

## 2013-10-02 ENCOUNTER — Inpatient Hospital Stay (HOSPITAL_COMMUNITY): Payer: Medicare Other

## 2013-10-02 ENCOUNTER — Encounter (HOSPITAL_COMMUNITY): Payer: Self-pay | Admitting: Nephrology

## 2013-10-02 DIAGNOSIS — R4182 Altered mental status, unspecified: Secondary | ICD-10-CM

## 2013-10-02 DIAGNOSIS — I447 Left bundle-branch block, unspecified: Secondary | ICD-10-CM

## 2013-10-02 DIAGNOSIS — N186 End stage renal disease: Secondary | ICD-10-CM

## 2013-10-02 DIAGNOSIS — E1029 Type 1 diabetes mellitus with other diabetic kidney complication: Secondary | ICD-10-CM

## 2013-10-02 DIAGNOSIS — G9341 Metabolic encephalopathy: Secondary | ICD-10-CM

## 2013-10-02 DIAGNOSIS — Z992 Dependence on renal dialysis: Secondary | ICD-10-CM

## 2013-10-02 DIAGNOSIS — R509 Fever, unspecified: Secondary | ICD-10-CM | POA: Diagnosis present

## 2013-10-02 DIAGNOSIS — E860 Dehydration: Secondary | ICD-10-CM | POA: Diagnosis present

## 2013-10-02 LAB — GRAM STAIN: Special Requests: NORMAL

## 2013-10-02 LAB — GLUCOSE, CAPILLARY
GLUCOSE-CAPILLARY: 191 mg/dL — AB (ref 70–99)
Glucose-Capillary: 171 mg/dL — ABNORMAL HIGH (ref 70–99)
Glucose-Capillary: 190 mg/dL — ABNORMAL HIGH (ref 70–99)
Glucose-Capillary: 200 mg/dL — ABNORMAL HIGH (ref 70–99)
Glucose-Capillary: 201 mg/dL — ABNORMAL HIGH (ref 70–99)
Glucose-Capillary: 202 mg/dL — ABNORMAL HIGH (ref 70–99)

## 2013-10-02 LAB — COMPREHENSIVE METABOLIC PANEL
ALK PHOS: 79 U/L (ref 39–117)
ALT: 12 U/L (ref 0–53)
AST: 30 U/L (ref 0–37)
Albumin: 3.1 g/dL — ABNORMAL LOW (ref 3.5–5.2)
BILIRUBIN TOTAL: 0.5 mg/dL (ref 0.3–1.2)
BUN: 61 mg/dL — ABNORMAL HIGH (ref 6–23)
CO2: 24 meq/L (ref 19–32)
Calcium: 11.1 mg/dL — ABNORMAL HIGH (ref 8.4–10.5)
Chloride: 95 mEq/L — ABNORMAL LOW (ref 96–112)
Creatinine, Ser: 7.58 mg/dL — ABNORMAL HIGH (ref 0.50–1.35)
GFR, EST AFRICAN AMERICAN: 8 mL/min — AB (ref >90)
GFR, EST NON AFRICAN AMERICAN: 7 mL/min — AB (ref >90)
GLUCOSE: 178 mg/dL — AB (ref 70–99)
POTASSIUM: 4.5 meq/L (ref 3.7–5.3)
Sodium: 144 mEq/L (ref 137–147)
TOTAL PROTEIN: 9.5 g/dL — AB (ref 6.0–8.3)

## 2013-10-02 LAB — CK TOTAL AND CKMB (NOT AT ARMC)
CK TOTAL: 351 U/L — AB (ref 7–232)
CK TOTAL: 339 U/L — AB (ref 7–232)
CK, MB: 2.2 ng/mL (ref 0.3–4.0)
CK, MB: 2.5 ng/mL (ref 0.3–4.0)
RELATIVE INDEX: 0.6 (ref 0.0–2.5)
RELATIVE INDEX: 0.7 (ref 0.0–2.5)

## 2013-10-02 LAB — TROPONIN I
Troponin I: <0.3 ng/mL (ref <0.30)
Troponin I: <0.3 ng/mL (ref <0.30)

## 2013-10-02 LAB — CSF CELL COUNT WITH DIFFERENTIAL
RBC Count, CSF: 7 /mm3 — ABNORMAL HIGH
Tube #: 1
WBC CSF: 1 /mm3 (ref 0–5)

## 2013-10-02 LAB — CBC
HCT: 40.2 % (ref 39.0–52.0)
HEMOGLOBIN: 13.6 g/dL (ref 13.0–17.0)
MCH: 28.9 pg (ref 26.0–34.0)
MCHC: 33.8 g/dL (ref 30.0–36.0)
MCV: 85.4 fL (ref 78.0–100.0)
PLATELETS: 258 10*3/uL (ref 150–400)
RBC: 4.71 MIL/uL (ref 4.22–5.81)
RDW: 18.2 % — ABNORMAL HIGH (ref 11.5–15.5)
WBC: 15 10*3/uL — AB (ref 4.0–10.5)

## 2013-10-02 LAB — GLUCOSE, CSF: Glucose, CSF: 156 mg/dL — ABNORMAL HIGH (ref 43–76)

## 2013-10-02 LAB — PROTEIN, CSF: TOTAL PROTEIN, CSF: 35 mg/dL (ref 15–45)

## 2013-10-02 LAB — PROTIME-INR
INR: 1.21 (ref 0.00–1.49)
Prothrombin Time: 15 seconds (ref 11.6–15.2)

## 2013-10-02 MED ORDER — ALTEPLASE 2 MG IJ SOLR
2.0000 mg | Freq: Once | INTRAMUSCULAR | Status: AC | PRN
  Filled 2013-10-02: qty 2

## 2013-10-02 MED ORDER — HEPARIN SODIUM (PORCINE) 1000 UNIT/ML DIALYSIS
1000.0000 [IU] | INTRAMUSCULAR | Status: DC | PRN
  Filled 2013-10-02: qty 1

## 2013-10-02 MED ORDER — LIDOCAINE HCL (PF) 1 % IJ SOLN: 5.0000 mL | INTRAMUSCULAR | Status: DC | PRN

## 2013-10-02 MED ORDER — SODIUM CHLORIDE 0.9 % IV SOLN: 100.0000 mL | INTRAVENOUS | Status: DC | PRN

## 2013-10-02 MED ORDER — NEPRO/CARBSTEADY PO LIQD: 237.0000 mL | ORAL | Status: DC | PRN

## 2013-10-02 MED ORDER — COLLAGENASE 250 UNIT/GM EX OINT
TOPICAL_OINTMENT | Freq: Every day | CUTANEOUS | Status: DC
  Administered 2013-10-02 – 2013-10-10 (×9): via TOPICAL
  Filled 2013-10-02: qty 30

## 2013-10-02 MED ORDER — PENTAFLUOROPROP-TETRAFLUOROETH EX AERO: 1.0000 "application " | INHALATION_SPRAY | CUTANEOUS | Status: DC | PRN

## 2013-10-02 MED ORDER — HEPARIN SODIUM (PORCINE) 1000 UNIT/ML DIALYSIS
2500.0000 [IU] | INTRAMUSCULAR | Status: DC | PRN
  Filled 2013-10-02: qty 3

## 2013-10-02 MED ORDER — LIDOCAINE-PRILOCAINE 2.5-2.5 % EX CREA
1.0000 "application " | TOPICAL_CREAM | CUTANEOUS | Status: DC | PRN
  Filled 2013-10-02: qty 5

## 2013-10-02 NOTE — Consult Note (Signed)
Renal Service Consult Note [facility]  EJ KITTNER 10/02/2013 Roney Jaffe D Requesting Physician:  Dr Verlon Au  Reason for Consult:  ESRD pt with AMS, recent BKA HPI: The patient is a 63 y.o. year-old with hx of HTN, DM2, PVD with bilat BKA, BPH, depression, and hep C.  Patient was here 2/13 - 09/11/13 for R BKA.  Discharged to SNF.  According to H&P patient was confused and not working wt PT at the SNF and a palliative care meeting was held with family on March 3rd due to FTT and suspected depression.  Then he did a little better but yest am was found unresponsive and was sent to hospital where temp was 101.3, BP 150/86 and HR 98.  Pt went for LP in IR this am.  Ortho saw pt and the R BK wound which showed ischemic wound chg's throughout, an ischemic ulcer over the knee 3 cm in diameter w black eschar, and a small drop of purulent drainage c/w infection.  Patient does not provide any hx at this time, he was very poorly responsive.   ROS  none  Past Medical History  Past Medical History  Diagnosis Date  . ESRD on hemodialysis 05/05/2007    ESRD due to DM/HTN. Started dialysis in November 2013.  HD TTS at [facility] on [address].  [name] BACK PAIN, LUMBAR, CHRONIC 08/06/2009  . BENIGN PROSTATIC HYPERTROPHY 08/01/2010  . CEREBROVASCULAR ACCIDENT, HX OF 08/06/2009  . CHOLELITHIASIS 08/01/2010  . CONGESTIVE HEART FAILURE 03/18/2009  . DEPRESSION 03/18/2009  . DIABETES MELLITUS, TYPE II 03/25/2007  . ERECTILE DYSFUNCTION 03/25/2007  . GERD 03/25/2007  . HEPATITIS C, HX OF 03/25/2007  . HYPERTENSION 03/25/2007  . Morbid obesity 03/25/2007  . NEPHROLITHIASIS, HX OF 03/18/2009  . Complication of anesthesia     wife states pt had trouble waking up with his last surgery in Nov., 2014   Past Surgical History  Past Surgical History  Procedure Laterality Date  . Nephrectomy      partial RR  . Av fistula placement  06/14/2012    Procedure: ARTERIOVENOUS (AV) FISTULA CREATION;  Surgeon:  Angelia Mould, MD;  Location: [facility] OR;  Service: Vascular;  Laterality: Left;  Left basilic vein transposition with fistula.  . Tibia im nail insertion Left 09/09/2012    Procedure: INTRAMEDULLARY (IM) NAIL TIBIAL;  Surgeon: Johnny Bridge, MD;  Location: [place];  Service: Orthopedics;  Laterality: Left;  left tibial nail and open reduction internal fixation left fibula fracture  . Orif fibula fracture Left 09/09/2012    Procedure: OPEN REDUCTION INTERNAL FIXATION (ORIF) FIBULA FRACTURE;  Surgeon: Johnny Bridge, MD;  Location: [place];  Service: Orthopedics;  Laterality: Left;  . Colonoscopy N/A 10/28/2012    Procedure: COLONOSCOPY;  Surgeon: Jeryl Columbia, MD;  Location: [facility] ENDOSCOPY;  Service: Endoscopy;  Laterality: N/A;  . Esophagogastroduodenoscopy N/A 11/02/2012    Procedure: ESOPHAGOGASTRODUODENOSCOPY (EGD);  Surgeon: Cleotis Nipper, MD;  Location: [facility] ENDOSCOPY;  Service: Endoscopy;  Laterality: N/A;  . Colonoscopy N/A 11/02/2012    Procedure: COLONOSCOPY;  Surgeon: Cleotis Nipper, MD;  Location: [facility] ENDOSCOPY;  Service: Endoscopy;  Laterality: N/A;  . Colonoscopy N/A 11/03/2012    Procedure: COLONOSCOPY;  Surgeon: Cleotis Nipper, MD;  Location: [facility] ENDOSCOPY;  Service: Endoscopy;  Laterality: N/A;  . Givens capsule study N/A 11/04/2012    Procedure: GIVENS CAPSULE STUDY;  Surgeon: Cleotis Nipper, MD;  Location: [facility] ENDOSCOPY;  Service: Endoscopy;  Laterality:  N/A;  . Enteroscopy N/A 11/08/2012    Procedure: ENTEROSCOPY;  Surgeon: Wonda Horner, MD;  Location: [facility] ENDOSCOPY;  Service: Endoscopy;  Laterality: N/A;  . Amputation Left 05/12/2013    Procedure: AMPUTATION RAY;  Surgeon: Newt Minion, MD;  Location: [place];  Service: Orthopedics;  Laterality: Left;  Left Foot 1st Ray Amputation  . Eye surgery Left     to remove scar tissue  . Amputation Left 06/09/2013    Procedure: AMPUTATION BELOW KNEE;  Surgeon: Newt Minion, MD;  Location: [place];  Service: Orthopedics;  Laterality: Left;   Left Below Knee Amputation and removal proximal screws IM tibial nail  . Hardware removal Left 06/09/2013    Procedure: HARDWARE REMOVAL;  Surgeon: Newt Minion, MD;  Location: [place];  Service: Orthopedics;  Laterality: Left;  Left Below Knee Amputation  and Removal proximal screws IM tibial nail  . Amputation Right 09/08/2013    Procedure: AMPUTATION BELOW KNEE;  Surgeon: Newt Minion, MD;  Location: [place];  Service: Orthopedics;  Laterality: Right;  Right Below Knee Amputation   Family History  Family History  Problem Relation Age of Onset  . Coronary artery disease Other   . Diabetes Other    Social History  reports that he has never smoked. He has never used smokeless tobacco. He reports that he does not drink alcohol or use illicit drugs. Allergies No Known Allergies Home medications Prior to Admission medications   Medication Sig Start Date End Date Taking? Authorizing Provider  acetaminophen (TYLENOL) 500 MG tablet Take 500 mg by mouth every 6 (six) hours as needed for mild pain.   Yes Historical Provider, MD  amLODipine (NORVASC) 10 MG tablet Take 10 mg by mouth daily.   Yes Historical Provider, MD  Ascorbic Acid (VITAMIN C PO) Take 1 tablet by mouth daily.   Yes Historical Provider, MD  brimonidine (ALPHAGAN) 0.2 % ophthalmic solution Place 1 drop into both eyes 2 (two) times daily.   Yes Historical Provider, MD  Brinzolamide-Brimonidine [facility]) 1-0.2 % SUSP Place 1 drop into both eyes 2 (two) times daily.    Yes Historical Provider, MD  calcium acetate (PHOSLO) 667 MG capsule Take 2 capsules (1,334 mg total) by mouth 3 (three) times daily with meals. 06/21/12  Yes Sorin June Leap, MD  docusate sodium (COLACE) 100 MG capsule Take 1 capsule (100 mg total) by mouth 2 (two) times daily. 05/15/13  Yes Marianne L York, PA-C  doxycycline (ADOXA) 100 MG tablet Take 100 mg by mouth 2 (two) times daily. Started on 09/29/13 X 3 weeks   Yes Historical Provider, MD  gabapentin (NEURONTIN) 300  MG capsule Take 300 mg by mouth daily as needed (for mild to moderate pain).    Yes Historical Provider, MD  haloperidol (HALDOL) 5 MG tablet Take 5 mg by mouth at bedtime as needed for agitation.   Yes Historical Provider, MD  HYDROcodone-acetaminophen (NORCO) 7.5-325 MG per tablet Take 1 tablet by mouth every 6 (six) hours as needed for moderate pain. 09/18/13  Yes Tiffany L Reed, DO  Insulin Glargine (LANTUS SOLOSTAR [place]) Inject 5 Units into the skin every evening.   Yes Historical Provider, MD  labetalol (NORMODYNE) 300 MG tablet Take 1 tablet (300 mg total) by mouth 3 (three) times daily. 05/15/13  Yes Marianne L York, PA-C  saccharomyces boulardii (FLORASTOR) 250 MG capsule Take 250 mg by mouth 2 (two) times daily.   Yes Historical Provider, MD  sertraline (ZOLOFT)  100 MG tablet Take 100 mg by mouth daily as needed. For depression   Yes Historical Provider, MD  silver sulfADIAZINE (SILVADENE) 1 % cream Apply 1 application topically daily.   Yes Historical Provider, MD  Travoprost, BAK Free, (TRAVATAN) 0.004 % SOLN ophthalmic solution Place 1 drop into both eyes at bedtime. 10/30/12  Yes Belkys A Harrel Carina, MD  traZODone (DESYREL) 50 MG tablet Take 50 mg by mouth at bedtime as needed for sleep.   Yes Historical Provider, MD   Liver Function Tests  Recent Labs Lab 10/01/13 1135 10/02/13 0255  AST 35 30  ALT 13 12  ALKPHOS 75 79  BILITOT 0.5 0.5  PROT 10.4* 9.5*  ALBUMIN 3.4* 3.1*   No results found for this basename: LIPASE, AMYLASE,  in the last 168 hours CBC  Recent Labs Lab 10/01/13 1135 10/01/13 1136 10/02/13 1112  WBC 12.6*  --  15.0*  NEUTROABS 9.2*  --   --   HGB 13.7 16.7 13.6  HCT 41.3 49.0 40.2  MCV 86.8  --  85.4  PLT 262  --  595   Basic Metabolic Panel  Recent Labs Lab 10/01/13 1135 10/01/13 1136 10/02/13 0255  NA 141 142 144  K 4.9 4.7 4.5  CL 92* 98 95*  CO2 28  --  24  GLUCOSE 356* 369* 178*  BUN 49* 56* 61*  CREATININE 6.33* 7.40* 7.58*   CALCIUM 11.2*  --  11.1*    Filed Vitals:   10/02/13 0400 10/02/13 0600 10/02/13 0620 10/02/13 0743  BP: 137/76 145/82  136/82  Pulse: 98 95  96  Temp:   101 F (38.3 C) 99.9 F (37.7 C)  TempSrc:   Axillary Axillary  Resp: 23 20  18   Height:      Weight:      SpO2: 98% 98%  99%   Exam: Adult AAM lying on side in bed, opens eyes to voice, mumbles then falls back asleep, no ^WOB No rash, cyanosis or gangrene Sclera anicteric, throat dry, not real cooperative No jvd Chest clear bilat RRR no MRG ABd soft, scaphoid, no ascites or tenderness , ND GU inflamed skin of glans penis w some eschar, no drainage Bilat LE BKA, R stump wrap not removed, no gross odor, no LE edema or UE edema LUA fistula patent, no signs of infection  CXR negative ECHO 10/01/13  LV and RV normal fxn Head CT negative  [facility] 4h 75min   72.5kg  2/2.0 [place]  LUA AVF  Heparin 5000 / 2400  Hectorol 1     Epo none     Venofer none   Assessment: 1 Altered mental status- r/o infection, also acute on chronic FTT, and recent BKA 2 ESRD on hemodialysis 3 Fever- r/o sepsis, meningitis, stump infection; empiric vanc and aztreonam IV 4 PVD - bilat BKA, recent R BKA on 09/08/13 5 DM on insulin 6 HTN BP normal on norvasc, labetalol ; continue; at dry wt, UF 1-2 kg as tol 7 2HPT cont phoslo, hold vit D (^Ca) 8 Hep C 9 Hx CVA 10 Anemia Hb 13.7, no esa 11 FTT / debility- agree with palliative care approach if not greatly improving   Plan- HD tomorrow, hold vit D, check phos and hold binder for now   Kelly Splinter MD (pgr) [phone]    (c) [phone] 10/02/2013, 1:13 PM

## 2013-10-02 NOTE — Procedures (Signed)
See dictated rad report.  No complication.

## 2013-10-02 NOTE — Progress Notes (Signed)
Late entry for 10/01/13  Patient reassessed ~18:10 pm  A Little more alert, oriented per wife at bedside  Given constellation of findings, suspicion for Meningitis  much lower, especially wiht dehiscent R Bka wound [see HPI] D/w Dr. Pilar Jarvis was supposed to see hime 3/9 @ OV]-he will see in consult on 3/9 am  Verneita Griffes, MD [facility] (P) [phone]

## 2013-10-02 NOTE — Progress Notes (Signed)
[name]  LACY TAGLIERI ZWC:[id number] DOB: 12/02/1950 DOA: 10/01/2013 PCP: [name], MD  Brief narrative: 63 year old male patient vasculopath with recent right BKA on 08/2013. He also has a prior history of CVA in 2009 and is on chronic hemodialysis. In addition he has combined systolic& diastolic heart failure with an EF of 45%. According to the patient's wife he had been doing well in the postoperative period until around the same time he followed up with Dr. Sharol Given. At that time he started with poor intake and gradually increasing altered mentation. 09/24/2013 he was very confused when not work with the therapist. On 09/26/2013 a meeting was called at the nursing facility because he was experiencing extreme symptoms of failure to thrive and they were concerned the patient was depressed. Palliative care team had apparently been meeting with the family prior to admission. By 09/29/2013 he was a little more coherent and began trying to eat but later in the morning he would not take his meds kept his mouth shut.  Postoperatively he has had 2 courses of antibiotics for concerns of possible stump infection. In regards to dietary intake: in addition to the above the patient has been on a dysphagia diet with pured foods begun after the patient was noted to be coughing with attempts to eat.   Upon presentation to the emergency department EMS reported that the wife found food in the patient's mouth and he was unresponsive. His CBG was 379. He was hypoxemic and placed on a nonrebreather mask. His hemoglobin at presentation was 16.7 which was markedly elevated compared to prior readings between 9.9 and 11.5. He was also noted to have significant decreased breath sounds on the right without wheezing or crackles. The right BKA stump was unremarkable in appearance. Was also found to be mildly febrile with a rectal temperature of 101. Chest x-ray was concerning for possible  right lower lobe pneumonia.  Assessment/Plan: Active Problems:    Encephalopathy, metabolic -Apparently somewhat more arousable than yesterday but still slow to respond -Suspect dehydration and possible underlying infectious processes as etiology   New LBBB -Since is full code will need work up -systolic function normal but septal motion with abnormal function and dyssynergy -cycle cardiac enzymes -doubt pt appropriate for invasive cardiac work up but may be helpful to family trying to establish goals of care    Dehydration /FTT (failure to thrive) in adult  -Continue IV fluid -Will need formal swallow evaluation before allow oral intake-SLP attempted 10/02/2013 the patient on able to arouse enough to dissipate with evaluation -May need to pursue palliative evaluation during this hospitalization noting patient is full code and has been having progressive decline over the past several months    Fever -Unclear etiology -To have question of right lower lobe right but currently is not hypoxic -He did have leukocytosis so continue empiric antibiotics -Patient unable to contribute to history and give her an altered mentation and fever an LP was done to rule out possible meningitis-CSF studies pending-partial results with 35 of protein and 156 of glucose so not indicative of bacterial meningitis -Since recent hospitalization and lives at skilled nursing facility with fever to 101 need to rule out influenza -Medication reconciliation list that patient was on doxycycline prior to admission which was started on 09/29/2013 and scheduled to go for 3 weeks -Check urinalysis and culture-blood cultures obtained and pending -Recent surgical site appears unremarkable    HYPERTENSION -Blood pressure controlled -  Continue IV Lopressor every 6 hours -Was on Normodyne prior to admission    Anemia due to chronic illness -Hemoglobin has trended down to 13 after hydration noting baseline just under 11     DM (diabetes mellitus) type I controlled with renal manifestation -On Lantus prior to admission-consider adding back a low dose Lantus -Continue sliding scale insulin -CBGs have trended downward from greater than 200 to 171    CKD (chronic kidney disease) stage V requiring chronic dialysis -Usual dialysis days Tuesday Thursday Saturday    Chronic combined systolic (EF [id number]) and grade 2 diastolic congestive heart failure -Compensated and managed with dialysis    HEPATITIS C, HX OF    CEREBROVASCULAR ACCIDENT, HX OF    BENIGN PROSTATIC HYPERTROPHY    Hyperlipidemia   DVT prophylaxis: Subcutaneous heparin Code Status: Full Family Communication: No family at bedside Disposition Plan/Expected LOS: Stepdown   Consultants: Nephrology Radiology for lumbar puncture  Procedures: Lumbar puncture by interventional radiology  Antibiotics: Aztreonam 3/08 >>> Vancomycin 3/08 >>>  HPI/Subjective: Patient barely awakens and essentially is nonverbal.  Objective: Blood pressure 136/82, pulse 96, temperature 99.9 F (37.7 C), temperature source Axillary, resp. rate 18, height 4\' 8"  (1.422 m), weight 159 lb 13.3 oz (72.5 kg), SpO2 99.00%. No intake or output data in the 24 hours ending 10/02/13 1243   Exam: General: No acute respiratory distress-appears dry Lungs: Clear to auscultation bilaterally without wheezes or crackles, RA Cardiovascular: Regular rate and rhythm without murmur gallop or rub normal S1 and S2, no peripheral edema or JVD Abdomen: Nontender, nondistended, soft, bowel sounds positive, no rebound, no ascites, no appreciable mass Musculoskeletal: No significant cyanosis, clubbing of bilateral lower extremities Neurological: Barely awakens, tends to moan, more garbled speech incomprehensible speech  Scheduled Meds:  Scheduled Meds: . aztreonam  1 g Intravenous Q24H  . collagenase   Topical Daily  . heparin  5,000 Units Subcutaneous 3 times per day  . insulin aspart   0-9 Units Subcutaneous 6 times per day  . metoprolol  5 mg Intravenous 4 times per day  . sodium chloride  3 mL Intravenous Q12H  . [START ON 10/03/2013] vancomycin  750 mg Intravenous Q T,Th,Sa-HD   Continuous Infusions:   Data Reviewed: Basic Metabolic Panel:  Recent Labs Lab 10/01/13 1135 10/01/13 1136 10/02/13 0255  NA 141 142 144  K 4.9 4.7 4.5  CL 92* 98 95*  CO2 28  --  24  GLUCOSE 356* 369* 178*  BUN 49* 56* 61*  CREATININE 6.33* 7.40* 7.58*  CALCIUM 11.2*  --  11.1*   Liver Function Tests:  Recent Labs Lab 10/01/13 1135 10/02/13 0255  AST 35 30  ALT 13 12  ALKPHOS 75 79  BILITOT 0.5 0.5  PROT 10.4* 9.5*  ALBUMIN 3.4* 3.1*   No results found for this basename: LIPASE, AMYLASE,  in the last 168 hours  Recent Labs Lab 10/01/13 1201  AMMONIA 29   CBC:  Recent Labs Lab 10/01/13 1135 10/01/13 1136 10/02/13 1112  WBC 12.6*  --  15.0*  NEUTROABS 9.2*  --   --   HGB 13.7 16.7 13.6  HCT 41.3 49.0 40.2  MCV 86.8  --  85.4  PLT 262  --  258   Cardiac Enzymes:  Recent Labs Lab 10/01/13 1135  TROPONINI <0.30   BNP (last 3 results)  Recent Labs  10/01/13 1135  PROBNP 1397.0*   CBG:  Recent Labs Lab 10/01/13 2114 10/02/13 0015 10/02/13 0331 10/02/13 0749 10/02/13  [place]    Recent Results (from the past 240 hour(s))  CULTURE, BLOOD (ROUTINE X 2)     Status: None   Collection Time    10/01/13 11:35 AM      Result Value Ref Range Status   Specimen Description BLOOD RIGHT WRIST   Final   Special Requests BOTTLES DRAWN AEROBIC ONLY 10CC   Final   Culture  Setup Time     Final   Value: 10/01/2013 19:00     Performed at [facility]   Culture     Final   Value:        BLOOD CULTURE RECEIVED NO GROWTH TO DATE CULTURE WILL BE HELD FOR 5 DAYS BEFORE ISSUING A FINAL NEGATIVE REPORT     Performed at [facility]   Report Status PENDING   Incomplete  CULTURE, BLOOD (ROUTINE X 2)     Status:  None   Collection Time    10/01/13 12:01 PM      Result Value Ref Range Status   Specimen Description BLOOD RIGHT FOREARM   Final   Special Requests BOTTLES DRAWN AEROBIC AND ANAEROBIC 5CC   Final   Culture  Setup Time     Final   Value: 10/01/2013 18:59     Performed at [facility]   Culture     Final   Value:        BLOOD CULTURE RECEIVED NO GROWTH TO DATE CULTURE WILL BE HELD FOR 5 DAYS BEFORE ISSUING A FINAL NEGATIVE REPORT     Performed at [facility]   Report Status PENDING   Incomplete  GRAM STAIN     Status: None   Collection Time    10/02/13 10:55 AM      Result Value Ref Range Status   Specimen Description CSF   Final   Special Requests Normal   Final   Gram Stain     Final   Value: CYTOSPIN PREP     WBC PRESENT, PREDOMINANTLY MONONUCLEAR     NO ORGANISMS SEEN   Report Status 10/02/2013 FINAL   Final     Studies:  Recent x-ray studies have been reviewed in detail by the Attending Physician  Time spent :     Erin Hearing, ANP [facility] Office  [phone] Pager [phone]  **If unable to reach the above provider after paging please contact the [place] @ [phone]  On-Call/Text Page:      [email or web address]      password [email or web address]  If 7PM-7AM, please contact night-coverage [email or web address] Password [email or web address] 10/02/2013, 12:43 PM   LOS: 1 day

## 2013-10-02 NOTE — Progress Notes (Addendum)
INITIAL NUTRITION ASSESSMENT  DOCUMENTATION CODES Per approved criteria  -Not Applicable   INTERVENTION: Advance diet as medically appropriate, add supplements when able RD to follow for nutrition care plan  NUTRITION DIAGNOSIS: Increased nutrient needs related to wound healing, ESRD as evidenced by estimated nutrition needs  Goal: Pt to meet >/= 90% of their estimated nutrition needs   Monitor:  PO & supplemental intake, weight, labs, I/O's  Reason for Assessment: Low Braden  63 y.o. male  Admitting Dx: Encephalopathy, toxic  ASSESSMENT: 63 y.o. Male with PMH of ESRD on HD, DM, left leg BKA 05/2013, right leg BKA 08/2013, GI bleed, CVA presented with altered mental status; ER physician attempted lumbar puncture; IR to attempt given his toxic metabolic encephalopathy.  Patient currently in [place] -- pt followed by Clinical Nutrition during previous hospitalization; pt with hx of good PO intake; pt with increased nutrition needs given wound presence & HD; current low braden score places patient at risk for further skin breakdown; would benefit from addition of oral nutrition supplements when able.  Height: 5' 9  (1.765 m)   Weight: Wt Readings from Last 1 Encounters:  10/02/13 159 lb 13.3 oz (72.5 kg)    Ideal Body Weight: 141 lb -- adjusted for bilateral BKA's  % Ideal Body Weight: 113%  Wt Readings from Last 10 Encounters:  10/02/13 159 lb 13.3 oz (72.5 kg)  09/25/13 169 lb 11.2 oz (76.975 kg)  09/14/13 151 lb (68.493 kg)  09/09/13 152 lb (68.947 kg)  09/09/13 152 lb (68.947 kg)  06/10/13 185 lb 10 oz (84.2 kg)  06/10/13 185 lb 10 oz (84.2 kg)  05/31/13 200 lb (90.719 kg)  05/15/13 200 lb 9.9 oz (91 kg)  05/15/13 200 lb 9.9 oz (91 kg)    Usual Body Weight: 152 lb  % Usual Body Weight: 106%  BMI:  26.4 kg/m2 -- adjusted for bilateral BKA's  Estimated Nutritional Needs: Kcal: 1900-2100 Protein: 100-110 gm Fluid: 1200 ml  Skin: Stage II  pressure ulcer to sacrum  Diet Order: NPO  EDUCATION NEEDS: -No education needs identified at this time  Labs:   Recent Labs Lab 10/01/13 1135 10/01/13 1136 10/02/13 0255  NA 141 142 144  K 4.9 4.7 4.5  CL 92* 98 95*  CO2 28  --  24  BUN 49* 56* 61*  CREATININE 6.33* 7.40* 7.58*  CALCIUM 11.2*  --  11.1*  GLUCOSE 356* 369* 178*    CBG (last 3)   Recent Labs  10/02/13 0015 10/02/13 0331 10/02/13 0749  GLUCAP 201* 200* 191*    Scheduled Meds: . aztreonam  1 g Intravenous Q24H  . collagenase   Topical Daily  . heparin  5,000 Units Subcutaneous 3 times per day  . insulin aspart  0-9 Units Subcutaneous 6 times per day  . metoprolol  5 mg Intravenous 4 times per day  . sodium chloride  3 mL Intravenous Q12H  . [START ON 10/03/2013] vancomycin  750 mg Intravenous Q T,Th,Sa-HD    Continuous Infusions:   Past Medical History  Diagnosis Date  . ALLERGIC RHINITIS 05/05/2007  . BACK PAIN, LUMBAR, CHRONIC 08/06/2009  . BENIGN PROSTATIC HYPERTROPHY 08/01/2010  . CEREBROVASCULAR ACCIDENT, HX OF 08/06/2009  . CHOLELITHIASIS 08/01/2010  . CONGESTIVE HEART FAILURE 03/18/2009  . DEPRESSION 03/18/2009  . DIABETES MELLITUS, TYPE II 03/25/2007  . ERECTILE DYSFUNCTION 03/25/2007  . GERD 03/25/2007  . HEPATITIS C, HX OF 03/25/2007  . HYPERTENSION 03/25/2007  . Morbid obesity 03/25/2007  .  NEPHROLITHIASIS, HX OF 03/18/2009  . ESRD on hemodialysis 09/08/2012    ESRD due to DM/HTN. Started dialysis in November 2013. Has LUA AVF placed Jun 14 2012 which is maturing as of Feb 2014.   [name] Renal insufficiency   . Complication of anesthesia     wife states pt had trouble waking up with his last surgery in Nov., 2014    Past Surgical History  Procedure Laterality Date  . Nephrectomy      partial RR  . Av fistula placement  06/14/2012    Procedure: ARTERIOVENOUS (AV) FISTULA CREATION;  Surgeon: Angelia Mould, MD;  Location[facility] OR;  Service: Vascular;  Laterality: Left;  Left basilic vein  transposition with fistula.  . Tibia im nail insertion Left 09/09/2012    Procedure: INTRAMEDULLARY (IM) NAIL TIBIAL;  Surgeon: Johnny Bridge, MD;  Location: [place];  Service: Orthopedics;  Laterality: Left;  left tibial nail and open reduction internal fixation left fibula fracture  . Orif fibula fracture Left 09/09/2012    Procedure: OPEN REDUCTION INTERNAL FIXATION (ORIF) FIBULA FRACTURE;  Surgeon: Johnny Bridge, MD;  Location: [place];  Service: Orthopedics;  Laterality: Left;  . Colonoscopy N/A 10/28/2012    Procedure: COLONOSCOPY;  Surgeon: Jeryl Columbia, MD;  Location[facility] ENDOSCOPY;  Service: Endoscopy;  Laterality: N/A;  . Esophagogastroduodenoscopy N/A 11/02/2012    Procedure: ESOPHAGOGASTRODUODENOSCOPY (EGD);  Surgeon: Cleotis Nipper, MD;  Location: [facility] ENDOSCOPY;  Service: Endoscopy;  Laterality: N/A;  . Colonoscopy N/A 11/02/2012    Procedure: COLONOSCOPY;  Surgeon: Cleotis Nipper, MD;  Location: [facility] ENDOSCOPY;  Service: Endoscopy;  Laterality: N/A;  . Colonoscopy N/A 11/03/2012    Procedure: COLONOSCOPY;  Surgeon: Cleotis Nipper, MD;  Location: [facility] ENDOSCOPY;  Service: Endoscopy;  Laterality: N/A;  . Givens capsule study N/A 11/04/2012    Procedure: GIVENS CAPSULE STUDY;  Surgeon: Cleotis Nipper, MD;  Location: [facility] ENDOSCOPY;  Service: Endoscopy;  Laterality: N/A;  . Enteroscopy N/A 11/08/2012    Procedure: ENTEROSCOPY;  Surgeon: Wonda Horner, MD;  Location: [facility] ENDOSCOPY;  Service: Endoscopy;  Laterality: N/A;  . Amputation Left 05/12/2013    Procedure: AMPUTATION RAY;  Surgeon: Newt Minion, MD;  Location: [place];  Service: Orthopedics;  Laterality: Left;  Left Foot 1st Ray Amputation  . Eye surgery Left     to remove scar tissue  . Amputation Left 06/09/2013    Procedure: AMPUTATION BELOW KNEE;  Surgeon: Newt Minion, MD;  Location: [place];  Service: Orthopedics;  Laterality: Left;  Left Below Knee Amputation and removal proximal screws IM tibial nail  . Hardware removal Left  06/09/2013    Procedure: HARDWARE REMOVAL;  Surgeon: Newt Minion, MD;  Location: [place];  Service: Orthopedics;  Laterality: Left;  Left Below Knee Amputation  and Removal proximal screws IM tibial nail  . Amputation Right 09/08/2013    Procedure: AMPUTATION BELOW KNEE;  Surgeon: Newt Minion, MD;  Location: [place];  Service: Orthopedics;  Laterality: Right;  Right Below Knee Amputation    Arthur Holms, RD, LDN Pager #: [phone] After-Hours Pager #: [phone]

## 2013-10-02 NOTE — Progress Notes (Signed)
Inpatient Diabetes Program Recommendations  AACE/ADA: New Consensus Statement on Inpatient Glycemic Control (2013)  Target Ranges:  Prepandial:   less than 140 mg/dL      Peak postprandial:   less than 180 mg/dL (1-2 hours)      Critically ill patients:  140 - 180 mg/dL   Reason for Visit: Results for TAJH, LIVSEY (MRN [id number]) as of 10/02/2013 09:59  Ref. Range 10/01/2013 16:18 10/01/2013 21:14 10/02/2013 00:15 10/02/2013 03:31 10/02/2013 07:49  Glucose-Capillary Latest Range: 70-99 mg/dL 267 (H) 222 (H) 201 (H) 200 (H) 191 (H)   Diabetes history: Type 2 diabetes with multiple complications. Outpatient Diabetes medications: Lantus 5 units daily Current orders for Inpatient glycemic control: Novolog sensitive q 4 hours  Please consider adding Lantus 5 units daily.   Adah Perl, RN, BC-ADM Inpatient Diabetes Coordinator Pager [phone]

## 2013-10-02 NOTE — Progress Notes (Signed)
     No active cardiac issues.  Troponin was negative. LBBB was likely due to rate and has resolved now that his HR is slwoer Echo showed normal LV functio  Will sign off.  Call for questions.   Thayer Headings, Brooke Bonito., MD, [facility] 10/02/2013, 7:53 AM Office - [phone] Pager [phone]

## 2013-10-02 NOTE — Progress Notes (Signed)
Patient seen, examined and discussed with my nurse practitioner. Agree with her note. Encephalopathy persisting. Unclear etiology for fever and noting leukocytosis so agree with antibiotics. Source unclear. Repeat blood work in the morning. Appreciate nephrology help.

## 2013-10-02 NOTE — Consult Note (Signed)
Reason for Consult: Altered mental status with elevated temperature Referring Physician: [name] is an 63 y.o. male.  HPI: Patient is a 63 year old gentleman with diabetes end-stage renal disease on dialysis status post bilateral transtibial amputations. Patient has not been in overall week. Patient presents with altered mental status and elevated temperature.  Past Medical History  Diagnosis Date  . ALLERGIC RHINITIS 05/05/2007  . BACK PAIN, LUMBAR, CHRONIC 08/06/2009  . BENIGN PROSTATIC HYPERTROPHY 08/01/2010  . CEREBROVASCULAR ACCIDENT, HX OF 08/06/2009  . CHOLELITHIASIS 08/01/2010  . CONGESTIVE HEART FAILURE 03/18/2009  . DEPRESSION 03/18/2009  . DIABETES MELLITUS, TYPE II 03/25/2007  . ERECTILE DYSFUNCTION 03/25/2007  . GERD 03/25/2007  . HEPATITIS C, HX OF 03/25/2007  . HYPERTENSION 03/25/2007  . Morbid obesity 03/25/2007  . NEPHROLITHIASIS, HX OF 03/18/2009  . ESRD on hemodialysis 09/08/2012    ESRD due to DM/HTN. Started dialysis in November 2013. Has LUA AVF placed Jun 14 2012 which is maturing as of Feb 2014.   [name] Renal insufficiency   . Complication of anesthesia     wife states pt had trouble waking up with his last surgery in Nov., 2014    Past Surgical History  Procedure Laterality Date  . Nephrectomy      partial RR  . Av fistula placement  06/14/2012    Procedure: ARTERIOVENOUS (AV) FISTULA CREATION;  Surgeon: Angelia Mould, MD;  Location: [facility] OR;  Service: Vascular;  Laterality: Left;  Left basilic vein transposition with fistula.  . Tibia im nail insertion Left 09/09/2012    Procedure: INTRAMEDULLARY (IM) NAIL TIBIAL;  Surgeon: Johnny Bridge, MD;  Location: [place];  Service: Orthopedics;  Laterality: Left;  left tibial nail and open reduction internal fixation left fibula fracture  . Orif fibula fracture Left 09/09/2012    Procedure: OPEN REDUCTION INTERNAL FIXATION (ORIF) FIBULA FRACTURE;  Surgeon: Johnny Bridge, MD;  Location: [place];  Service:  Orthopedics;  Laterality: Left;  . Colonoscopy N/A 10/28/2012    Procedure: COLONOSCOPY;  Surgeon: Jeryl Columbia, MD;  Location: [facility] ENDOSCOPY;  Service: Endoscopy;  Laterality: N/A;  . Esophagogastroduodenoscopy N/A 11/02/2012    Procedure: ESOPHAGOGASTRODUODENOSCOPY (EGD);  Surgeon: Cleotis Nipper, MD;  Location: [facility] ENDOSCOPY;  Service: Endoscopy;  Laterality: N/A;  . Colonoscopy N/A 11/02/2012    Procedure: COLONOSCOPY;  Surgeon: Cleotis Nipper, MD;  Location: [facility] ENDOSCOPY;  Service: Endoscopy;  Laterality: N/A;  . Colonoscopy N/A 11/03/2012    Procedure: COLONOSCOPY;  Surgeon: Cleotis Nipper, MD;  Location: [facility] ENDOSCOPY;  Service: Endoscopy;  Laterality: N/A;  . Givens capsule study N/A 11/04/2012    Procedure: GIVENS CAPSULE STUDY;  Surgeon: Cleotis Nipper, MD;  Location: [facility] ENDOSCOPY;  Service: Endoscopy;  Laterality: N/A;  . Enteroscopy N/A 11/08/2012    Procedure: ENTEROSCOPY;  Surgeon: Wonda Horner, MD;  Location: [facility] ENDOSCOPY;  Service: Endoscopy;  Laterality: N/A;  . Amputation Left 05/12/2013    Procedure: AMPUTATION RAY;  Surgeon: Newt Minion, MD;  Location: [place];  Service: Orthopedics;  Laterality: Left;  Left Foot 1st Ray Amputation  . Eye surgery Left     to remove scar tissue  . Amputation Left 06/09/2013    Procedure: AMPUTATION BELOW KNEE;  Surgeon: Newt Minion, MD;  Location: [place];  Service: Orthopedics;  Laterality: Left;  Left Below Knee Amputation and removal proximal screws IM tibial nail  . Hardware removal Left 06/09/2013    Procedure: HARDWARE REMOVAL;  Surgeon:  Newt Minion, MD;  Location: [place];  Service: Orthopedics;  Laterality: Left;  Left Below Knee Amputation  and Removal proximal screws IM tibial nail  . Amputation Right 09/08/2013    Procedure: AMPUTATION BELOW KNEE;  Surgeon: Newt Minion, MD;  Location: [place];  Service: Orthopedics;  Laterality: Right;  Right Below Knee Amputation    Family History  Problem Relation Age of Onset  . Coronary artery  disease Other   . Diabetes Other     Social History:  reports that he has never smoked. He has never used smokeless tobacco. He reports that he does not drink alcohol or use illicit drugs.  Allergies: No Known Allergies  Medications: I have reviewed the patient's current medications.  Results for orders placed during the hospital encounter of 10/01/13 (from the past 48 hour(s))  CBC WITH DIFFERENTIAL     Status: Abnormal   Collection Time    10/01/13 11:35 AM      Result Value Ref Range   WBC 12.6 (*) 4.0 - 10.5 K/uL   RBC 4.76  4.22 - 5.81 MIL/uL   Hemoglobin 13.7  13.0 - 17.0 g/dL   HCT 41.3  39.0 - 52.0 %   MCV 86.8  78.0 - 100.0 fL   MCH 28.8  26.0 - 34.0 pg   MCHC 33.2  30.0 - 36.0 g/dL   RDW 18.7 (*) 11.5 - 15.5 %   Platelets 262  150 - 400 K/uL   Neutrophils Relative % 73  43 - 77 %   Neutro Abs 9.2 (*) 1.7 - 7.7 K/uL   Lymphocytes Relative 12  12 - 46 %   Lymphs Abs 1.5  0.7 - 4.0 K/uL   Monocytes Relative 15 (*) 3 - 12 %   Monocytes Absolute 1.9 (*) 0.1 - 1.0 K/uL   Eosinophils Relative 0  0 - 5 %   Eosinophils Absolute 0.0  0.0 - 0.7 K/uL   Basophils Relative 0  0 - 1 %   Basophils Absolute 0.0  0.0 - 0.1 K/uL  COMPREHENSIVE METABOLIC PANEL     Status: Abnormal   Collection Time    10/01/13 11:35 AM      Result Value Ref Range   Sodium 141  137 - 147 mEq/L   Potassium 4.9  3.7 - 5.3 mEq/L   Comment: HEMOLYSIS AT THIS LEVEL MAY AFFECT RESULT   Chloride 92 (*) 96 - 112 mEq/L   CO2 28  19 - 32 mEq/L   Glucose, Bld 356 (*) 70 - 99 mg/dL   BUN 49 (*) 6 - 23 mg/dL   Creatinine, Ser 6.33 (*) 0.50 - 1.35 mg/dL   Calcium 11.2 (*) 8.4 - 10.5 mg/dL   Total Protein 10.4 (*) 6.0 - 8.3 g/dL   Albumin 3.4 (*) 3.5 - 5.2 g/dL   AST 35  0 - 37 U/L   Comment: HEMOLYSIS AT THIS LEVEL MAY AFFECT RESULT   ALT 13  0 - 53 U/L   Comment: HEMOLYSIS AT THIS LEVEL MAY AFFECT RESULT   Alkaline Phosphatase 75  39 - 117 U/L   Total Bilirubin 0.5  0.3 - 1.2 mg/dL   GFR calc non Af  Amer 8 (*) >90 mL/min   GFR calc Af Amer 10 (*) >90 mL/min   Comment: (NOTE)     The eGFR has been calculated using the CKD EPI equation.     This calculation has not been validated in all clinical situations.  eGFR's persistently <90 mL/min signify possible Chronic Kidney     Disease.  TROPONIN I     Status: None   Collection Time    10/01/13 11:35 AM      Result Value Ref Range   Troponin I <0.30  <0.30 ng/mL   Comment:            Due to the release kinetics of cTnI,     a negative result within the first hours     of the onset of symptoms does not rule out     myocardial infarction with certainty.     If myocardial infarction is still suspected,     repeat the test at appropriate intervals.  PRO B NATRIURETIC PEPTIDE     Status: Abnormal   Collection Time    10/01/13 11:35 AM      Result Value Ref Range   Pro B Natriuretic peptide (BNP) 1397.0 (*) 0 - 125 pg/mL  I-STAT CG4 LACTIC ACID, ED     Status: None   Collection Time    10/01/13 11:36 AM      Result Value Ref Range   Lactic Acid, Venous 2.10  0.5 - 2.2 mmol/L  I-STAT CHEM 8, ED     Status: Abnormal   Collection Time    10/01/13 11:36 AM      Result Value Ref Range   Sodium 142  137 - 147 mEq/L   Potassium 4.7  3.7 - 5.3 mEq/L   Chloride 98  96 - 112 mEq/L   BUN 56 (*) 6 - 23 mg/dL   Creatinine, Ser 7.40 (*) 0.50 - 1.35 mg/dL   Glucose, Bld 369 (*) 70 - 99 mg/dL   Calcium, Ion 1.09 (*) 1.13 - 1.30 mmol/L   TCO2 32  0 - 100 mmol/L   Hemoglobin 16.7  13.0 - 17.0 g/dL   HCT 49.0  39.0 - 52.0 %  I-STAT ARTERIAL BLOOD GAS, ED     Status: Abnormal   Collection Time    10/01/13 11:43 AM      Result Value Ref Range   pH, Arterial 7.480 (*) 7.350 - 7.450   pCO2 arterial 45.9 (*) 35.0 - 45.0 mmHg   pO2, Arterial 465.0 (*) 80.0 - 100.0 mmHg   Bicarbonate 34.2 (*) 20.0 - 24.0 mEq/L   TCO2 36  0 - 100 mmol/L   O2 Saturation 100.0     Acid-Base Excess 9.0 (*) 0.0 - 2.0 mmol/L   Collection site FEMORAL ARTERY      Drawn by :MD     Sample type ARTERIAL    AMMONIA     Status: None   Collection Time    10/01/13 12:01 PM      Result Value Ref Range   Ammonia 29  11 - 60 umol/L  PROTIME-INR     Status: None   Collection Time    10/01/13  3:25 PM      Result Value Ref Range   Prothrombin Time 14.8  11.6 - 15.2 seconds   INR 1.19  0.00 - 1.49  GLUCOSE, CAPILLARY     Status: Abnormal   Collection Time    10/01/13  4:18 PM      Result Value Ref Range   Glucose-Capillary 267 (*) 70 - 99 mg/dL  GLUCOSE, CAPILLARY     Status: Abnormal   Collection Time    10/01/13  9:14 PM      Result Value Ref Range   Glucose-Capillary  222 (*) 70 - 99 mg/dL  GLUCOSE, CAPILLARY     Status: Abnormal   Collection Time    10/02/13 12:15 AM      Result Value Ref Range   Glucose-Capillary 201 (*) 70 - 99 mg/dL  COMPREHENSIVE METABOLIC PANEL     Status: Abnormal   Collection Time    10/02/13  2:55 AM      Result Value Ref Range   Sodium 144  137 - 147 mEq/L   Potassium 4.5  3.7 - 5.3 mEq/L   Chloride 95 (*) 96 - 112 mEq/L   CO2 24  19 - 32 mEq/L   Glucose, Bld 178 (*) 70 - 99 mg/dL   BUN 61 (*) 6 - 23 mg/dL   Creatinine, Ser 7.58 (*) 0.50 - 1.35 mg/dL   Calcium 11.1 (*) 8.4 - 10.5 mg/dL   Total Protein 9.5 (*) 6.0 - 8.3 g/dL   Albumin 3.1 (*) 3.5 - 5.2 g/dL   AST 30  0 - 37 U/L   ALT 12  0 - 53 U/L   Alkaline Phosphatase 79  39 - 117 U/L   Total Bilirubin 0.5  0.3 - 1.2 mg/dL   GFR calc non Af Amer 7 (*) >90 mL/min   GFR calc Af Amer 8 (*) >90 mL/min   Comment: (NOTE)     The eGFR has been calculated using the CKD EPI equation.     This calculation has not been validated in all clinical situations.     eGFR's persistently <90 mL/min signify possible Chronic Kidney     Disease.  PROTIME-INR     Status: None   Collection Time    10/02/13  2:55 AM      Result Value Ref Range   Prothrombin Time 15.0  11.6 - 15.2 seconds   INR 1.21  0.00 - 1.49  GLUCOSE, CAPILLARY     Status: Abnormal   Collection Time     10/02/13  3:31 AM      Result Value Ref Range   Glucose-Capillary 200 (*) 70 - 99 mg/dL    Ct Head Wo Contrast  10/01/2013   CLINICAL DATA:  Mental status change.  EXAM: CT HEAD WITHOUT CONTRAST  TECHNIQUE: Contiguous axial images were obtained from the base of the skull through the vertex without intravenous contrast.  COMPARISON:  CT HEAD W/O CM dated 09/29/2013  FINDINGS: There is mild stable diffuse cerebral and cerebellar atrophy with compensatory ventriculomegaly. There is no intracranial hemorrhage nor intracranial mass effect. There is no evidence of an evolving ischemic infarction. There is decreased density in the deep white matter of both cerebral hemispheres consistent with chronic small vessel ischemic type change.  At bone window settings there is hyperostosis frontalis interna. There is no evidence of an acute skull fracture. The observed portions of the paranasal sinuses and mastoid air cells are clear.  IMPRESSION: 1. There is no evidence of an acute intracranial hemorrhage nor of an evolving ischemic infarction. 2. There is stable diffuse atrophy with evidence of chronic small vessel ischemic change. 3. There is no intracranial mass effect. 4. There is no evidence of an acute skull fracture.   Electronically Signed   By: David  Martinique   On: 10/01/2013 13:42   Dg Chest Port 1 View  10/01/2013   CLINICAL DATA:  Hypoxia, altered mental status.  EXAM: PORTABLE CHEST - 1 VIEW  COMPARISON:  DG CHEST 2 VIEW dated 06/09/2013  FINDINGS: The lungs are adequately inflated.  There is no focal infiltrate. The cardiac silhouette is normal in size. The pulmonary vascularity is not engorged. The mediastinum is normal in width. There is no pleural effusion or pneumothorax. The observed portions of the bony thorax appear normal.  IMPRESSION: There is no evidence of pneumonia nor CHF or other acute cardiopulmonary disease.   Electronically Signed   By: David  Martinique   On: 10/01/2013 11:52    Review of Systems   All other systems reviewed and are negative.   Blood pressure 135/76, pulse 97, temperature 101 F (38.3 C), temperature source Axillary, resp. rate 23, height '4\' 8"'  (1.422 m), weight 72.5 kg (159 lb 13.3 oz), SpO2 97.00%. Physical Exam On examination patient's left transtibial amputation he has an ischemic ulcer over the knee which is about 3 cm in diameter there is a black eschar there is no fluctuance no cellulitis no signs of infection. Examination the right transtibial amputation he is ischemic changes along the entire wound however there is no breakdown of the wound. There is no fluctuance no cellulitis. There is a very small drop of purulent drainage consistent with infection. Assessment/Plan: Assessment: Small infection right transtibial amputation with ischemic changes to the surgical incision on the right and ischemic ulcer on the left knee.  Plan: Patient is on a good broad-spectrum antibiotics. The purulence was drained at bedside. This does not seem to be large enough to be causing his systemic problems. I will follow during the hospital stay and see how the infection in the right transtibial amputation progresses.  Oscar Castillo V 10/02/2013, 6:39 AM

## 2013-10-02 NOTE — Consult Note (Signed)
WOC wound consult note Reason for Consult: evaluation of sacral wound. Pt with bilateral LE amputations, followed by Dr. Sharol Given.   Wound type: Unstageable pressure ulcer; sacrum Pressure Ulcer POA: Yes Measurement: 3.5cm x 3.0cm x 0.5cm  Wound bed:100% yellow slough, adherent to wound bed.  Drainage (amount, consistency, odor) moderate serousanginous drainage, no odor but a slight fluctuance under slough, may be much deeper when slough removed. Not able to palpate bone at this point.  Periwound:intact Dressing procedure/placement/frequency: add enzymatic debridement ointment to sacral ulcer. Add air mattress overlay, notified unit secretary of need.    Discussed POC with patient and bedside nurse.  Re consult if needed, will not follow at this time. Thanks  [place] Basham Kellogg, [place] [phone])

## 2013-10-02 NOTE — Progress Notes (Signed)
SLP Cancellation Note  Patient Details Name: AYAD NIEMAN MRN: [id number] DOB: 07/15/1951   Cancelled evaluation:       Reason Eval/Treat Not Completed: Fatigue/lethargy limiting ability to participate.  Pt not sufficiently arousable to participate in swallow evaluation.  Will return next date for improved readiness. Arielis Leonhart L. Tivis Ringer, [place] CCC/SLP Pager [phone]    Juan Quam Laurice 10/02/2013, 11:08 AM

## 2013-10-03 DIAGNOSIS — A419 Sepsis, unspecified organism: Secondary | ICD-10-CM

## 2013-10-03 DIAGNOSIS — R509 Fever, unspecified: Secondary | ICD-10-CM

## 2013-10-03 LAB — MRSA PCR SCREENING: MRSA by PCR: NEGATIVE

## 2013-10-03 LAB — CBC
HCT: 37.9 % — ABNORMAL LOW (ref 39.0–52.0)
HEMOGLOBIN: 12.7 g/dL — AB (ref 13.0–17.0)
MCH: 28.3 pg (ref 26.0–34.0)
MCHC: 33.5 g/dL (ref 30.0–36.0)
MCV: 84.4 fL (ref 78.0–100.0)
Platelets: 306 10*3/uL (ref 150–400)
RBC: 4.49 MIL/uL (ref 4.22–5.81)
RDW: 18.4 % — AB (ref 11.5–15.5)
WBC: 12.5 10*3/uL — ABNORMAL HIGH (ref 4.0–10.5)

## 2013-10-03 LAB — GLUCOSE, CAPILLARY
GLUCOSE-CAPILLARY: 158 mg/dL — AB (ref 70–99)
GLUCOSE-CAPILLARY: 161 mg/dL — AB (ref 70–99)
Glucose-Capillary: 154 mg/dL — ABNORMAL HIGH (ref 70–99)
Glucose-Capillary: 131 mg/dL — ABNORMAL HIGH (ref 70–99)
Glucose-Capillary: 106 mg/dL — ABNORMAL HIGH (ref 70–99)

## 2013-10-03 LAB — BASIC METABOLIC PANEL
BUN: 85 mg/dL — AB (ref 6–23)
CO2: 25 mEq/L (ref 19–32)
CREATININE: 9.88 mg/dL — AB (ref 0.50–1.35)
Calcium: 11.3 mg/dL — ABNORMAL HIGH (ref 8.4–10.5)
Chloride: 95 mEq/L — ABNORMAL LOW (ref 96–112)
GFR calc Af Amer: 6 mL/min — ABNORMAL LOW (ref >90)
GFR, EST NON AFRICAN AMERICAN: 5 mL/min — AB (ref >90)
GLUCOSE: 160 mg/dL — AB (ref 70–99)
Potassium: 4.9 mEq/L (ref 3.7–5.3)
Sodium: 145 mEq/L (ref 137–147)

## 2013-10-03 LAB — INFLUENZA PANEL BY PCR (TYPE A & B)
H1N1 flu by pcr: NOT DETECTED
INFLAPCR: NEGATIVE
Influenza B By PCR: NEGATIVE

## 2013-10-03 LAB — CK TOTAL AND CKMB (NOT AT ARMC)
CK, MB: 2.9 ng/mL (ref 0.3–4.0)
Relative Index: 0.8 (ref 0.0–2.5)
Total CK: 345 U/L — ABNORMAL HIGH (ref 7–232)

## 2013-10-03 LAB — TROPONIN I: Troponin I: <0.3 ng/mL (ref <0.30)

## 2013-10-03 MED ORDER — SODIUM CHLORIDE 0.9 % IV SOLN: INTRAVENOUS | Status: DC

## 2013-10-03 MED ORDER — DEXTROSE-NACL 5-0.2 % IV SOLN
INTRAVENOUS | Status: DC
  Administered 2013-10-03 – 2013-10-04 (×3): via INTRAVENOUS
  Filled 2013-10-03 (×2): qty 1000

## 2013-10-03 NOTE — Progress Notes (Signed)
  [facility] Progress Note   Subjective: On HD , more responsive but still not making sense  Filed Vitals:   10/03/13 0817 10/03/13 0830 10/03/13 0900 10/03/13 0930  BP: 123/83 120/81 93/73 85/65   Pulse: 90 91 91 91  Temp:      TempSrc:      Resp:      Height:      Weight:      SpO2: 100%  100%    Exam: More alert, still very confused, on HD Mouth is less dry today No jvd  Chest clear bilat  RRR no MRG  ABd soft, scaphoid, no ascites or tenderness , ND  GU inflamed skin of glans penis w some eschar, no drainage  Bilat LE BKA, no edema LUA fistula patent  CXR negative  ECHO 10/01/13 LV and RV normal fxn  Head CT negative   [facility]  4h 66min 72.5kg 2/2.0 [place] LUA AVF Heparin 5000 / 2400  Hectorol 1 Epo none Venofer none   Assessment:  1 Altered mental status- slightly better 2 ESRD on hemodialysis  3 Fever- r/o sepsis, stump infection; empiric vanc and aztreonam IV  4 PVD / hx L BKA + recent R BKA 09/08/13  5 DM on insulin  6 HTN / volume-  BP's low, vol depleted and dehydrated, BP meds on hold 7 2HPT cont phoslo, hold vit D (^Ca)  8 Hep C  9 Hx CVA 10 Anemia Hb 12.5, no esa  11 FTT / debility / recent depression after 2nd leg amp- agree with palliative care approach if not greatly improving   Plan-  HD today, no UF, agree w IVF, changed to D5 1/4    Kelly Splinter MD  pager [phone]    cell [phone]  10/03/2013, 9:55 AM     Recent Labs Lab 10/01/13 1135 10/01/13 1136 10/02/13 0255 10/03/13 0304  NA 141 142 144 145  K 4.9 4.7 4.5 4.9  CL 92* 98 95* 95*  CO2 28  --  24 25  GLUCOSE 356* 369* 178* 160*  BUN 49* 56* 61* 85*  CREATININE 6.33* 7.40* 7.58* 9.88*  CALCIUM 11.2*  --  11.1* 11.3*    Recent Labs Lab 10/01/13 1135 10/02/13 0255  AST 35 30  ALT 13 12  ALKPHOS 75 79  BILITOT 0.5 0.5  PROT 10.4* 9.5*  ALBUMIN 3.4* 3.1*    Recent Labs Lab 10/01/13 1135 10/01/13 1136 10/02/13 1112 10/03/13 0304  WBC 12.6*   --  15.0* 12.5*  NEUTROABS 9.2*  --   --   --   HGB 13.7 16.7 13.6 12.7*  HCT 41.3 49.0 40.2 37.9*  MCV 86.8  --  85.4 84.4  PLT 262  --  258 306   . aztreonam  1 g Intravenous Q24H  . collagenase   Topical Daily  . heparin  5,000 Units Subcutaneous 3 times per day  . insulin aspart  0-9 Units Subcutaneous 6 times per day  . metoprolol  5 mg Intravenous 4 times per day  . sodium chloride  3 mL Intravenous Q12H  . vancomycin  750 mg Intravenous Q T,Th,Sa-HD   . dextrose 5 % and 0.2 % NaCl     sodium chloride, sodium chloride, feeding supplement (NEPRO CARB STEADY), heparin, heparin, lidocaine (PF), lidocaine-prilocaine, pentafluoroprop-tetrafluoroeth

## 2013-10-03 NOTE — Procedures (Signed)
I was present at this dialysis session, have reviewed the session itself and made  appropriate changes  Kelly Splinter MD (pgr) [phone]    ([phone] 10/03/2013, 9:55 AM

## 2013-10-03 NOTE — Clinical Social Work Psychosocial (Addendum)
ADDITIONAL INFORMATION: CSW intern contacted and spoke with wife to confirm patient's return to [facility] at discharge. Wife is concerned about financial liability to SNF as patient only has Medicare A&B and may have one more day of 20-day period where insurance pays 100%. Mrs. Favia advised CSW intern that she would contact facility regarding her concerns/questions regarding financial liability to facility if patient returns.  CSW intern's note approved as written with above information.  Mattew Chriswell Givens, MSW, LCSW [phone]

## 2013-10-03 NOTE — Progress Notes (Signed)
Utilization Review Completed.  

## 2013-10-03 NOTE — Plan of Care (Signed)
Labs reviewed. CPK >300 and very poor oral intake. Will resume IVFs at 100/hr.  Erin Hearing, ANP

## 2013-10-03 NOTE — Clinical Social Work Psychosocial (Signed)
Clinical Social Work Department BRIEF PSYCHOSOCIAL ASSESSMENT 10/03/2013  Patient:  Oscar Castillo, Oscar Castillo     Account Number:  [id number]     Admit date:  10/01/2013  Clinical Social Worker:  Frederico Hamman  Date/Time:  10/03/2013 04:14 PM  Referred by:  Physician  Date Referred:  10/02/2013 Referred for  SNF Placement   Other Referral:   Interview type:  Family Other interview type:   Patient has a wife, Oscar Castillo (C) [phone] ([phone].    PSYCHOSOCIAL DATA Living Status:  FACILITY Admitted from facility:  [name] Level of care:  [place] Primary support name:  Burech Mcfarland Primary support relationship to patient:  SPOUSE Degree of support available:    CURRENT CONCERNS Current Concerns  Post-Acute Placement   Other Concerns:    SOCIAL WORK ASSESSMENT / PLAN CSW intern was unable to speak to patient due to drowsiness. CSW intern contacted wife, Oscar Castillo to confirm discharge plans for the patient. Oscar Castillo advised CSW intern that patient is from [facility] but she was unsure if the patient would return. [name] is worried about financial needs of the nursing facility and unable to provide any discharge plans at the moment. [name] would be in contact with CSW.   Assessment/plan status:  Psychosocial Support/Ongoing Assessment of Needs Other assessment/ plan:   SNF Placement   Information/referral to community resources:    PATIENT'S/FAMILY'S RESPONSE TO PLAN OF CARE: CSW was unable to meet with patient. Patient was not alert or orientated. CSW intern spoke with wife, Oscar Castillo and she seemed very concerned about patient's discharge and financial situation after discharge. Oscar Castillo was thankful for the information received and would be in contact with discharge plans.    Wellington Hampshire, CSW Intern.

## 2013-10-03 NOTE — Progress Notes (Signed)
[name]  Oscar Castillo XBD:[id number] DOB: 24-May-1951 DOA: 10/01/2013 PCP: [name], MD  Brief narrative: 63 year old male patient vasculopath with recent right BKA on 08/2013. He also has a prior history of CVA in 2009 and is on chronic hemodialysis. In addition he has combined systolic& diastolic heart failure with an EF of 45%. According to the patient's wife he had been doing well in the postoperative period until around the same time he followed up with Dr. Sharol Given. At that time he started with poor intake and gradually increasing altered mentation. 09/24/2013 he was very confused when not work with the therapist. On 09/26/2013 a meeting was called at the nursing facility because he was experiencing extreme symptoms of failure to thrive and they were concerned the patient was depressed. Palliative care team had apparently been meeting with the family prior to admission. By 09/29/2013 he was a little more coherent and began trying to eat but later in the morning he would not take his meds kept his mouth shut.  Postoperatively he has had 2 courses of antibiotics for concerns of possible stump infection. In regards to dietary intake: in addition to the above the patient has been on a dysphagia diet with pured foods begun after the patient was noted to be coughing with attempts to eat.   Upon presentation to the emergency department EMS reported that the wife found food in the patient's mouth and he was unresponsive. His CBG was 379. He was hypoxemic and placed on a nonrebreather mask. His hemoglobin at presentation was 16.7 which was markedly elevated compared to prior readings between 9.9 and 11.5. He was also noted to have significant decreased breath sounds on the right without wheezing or crackles. The right BKA stump was unremarkable in appearance. Was also found to be mildly febrile with a rectal temperature of 101. Chest x-ray was concerning for possible  right lower lobe pneumonia.  Assessment/Plan: Active Problems:    Encephalopathy, metabolic -Apparently somewhat more arousable than yesterday but still slow to respond -Suspect dehydration and possible underlying infectious processes as etiology   New LBBB -Since is full code will need work up -systolic function normal but septal motion with abnormal function and dyssynergy -cycle cardiac enzymes -doubt pt appropriate for invasive cardiac work up but may be helpful to family trying to establish goals of care    Dehydration /FTT (failure to thrive) in adult  -Continue IV fluid-type of fluid adjusted by nephrology today -Will need formal swallow evaluation before allow oral intake-SLP attempted 10/02/2013 the patient on able to arouse enough to dissipate with evaluation -May need to pursue palliative evaluation during this hospitalization noting patient is full code and has been having progressive decline over the past several months    Fever -Unclear etiology -To have question of right lower lobe right but currently is not hypoxic -He did have leukocytosis so continue empiric antibiotics -Patient unable to contribute to history and give her an altered mentation and fever an LP was done to rule out possible meningitis-CSF studies pending-partial results with 35 of protein and 156 of glucose so not indicative of bacterial meningitis-CSF culture no growth -Influenza PCR negative -Medication reconciliation list that patient was on doxycycline prior to admission which was started on 09/29/2013 or suspected postoperative wound infection/possible osteomyelitis and scheduled to go for 3 weeks -Blood cultures no growth to date urinalysis unremarkable -Recent surgical site appears unremarkable    HYPERTENSION -Blood pressure controlled -  Continue IV Lopressor every 6 hours -Was on Normodyne prior to admission    Anemia due to chronic illness -Hemoglobin has trended down to 13 after hydration  noting baseline just under 11    DM (diabetes mellitus) type I controlled with renal manifestation -On Lantus prior to admission-consider adding back a low dose Lantus -Continue sliding scale insulin -CBGs remained controlled    CKD (chronic kidney disease) stage V requiring chronic dialysis -Usual dialysis days Tuesday Thursday Saturday -No ultrafiltration today secondary to ongoing dehydration    Chronic combined systolic (EF [id number]) and grade 2 diastolic congestive heart failure -Compensated and managed with dialysis    HEPATITIS C, HX OF    CEREBROVASCULAR ACCIDENT, HX OF    BENIGN PROSTATIC HYPERTROPHY    Hyperlipidemia   DVT prophylaxis: Subcutaneous heparin Code Status: Full Family Communication: No family at bedside Disposition Plan/Expected LOS: Stepdown   Consultants: Nephrology Radiology for lumbar puncture  Procedures: Lumbar puncture by interventional radiology  Antibiotics: Aztreonam 3/08 >>> Vancomycin 3/08 >>>  HPI/Subjective: Patient oral weight today and mumbles responses but still looks somewhat toxic in appearance  Objective: Blood pressure 126/82, pulse 97, temperature 99.5 F (37.5 C), temperature source Oral, resp. rate 11, height 4\' 8"  (1.422 m), weight 172 lb 6.4 oz (78.2 kg), SpO2 100.00%.  Intake/Output Summary (Last 24 hours) at 10/03/13 1707 Last data filed at 10/03/13 1400  Gross per 24 hour  Intake    320 ml  Output   -165 ml  Net    485 ml     Exam: General: No acute respiratory distress- dry and somewhat toxic in appearance Lungs: Clear to auscultation bilaterally without wheezes or crackles, RA Cardiovascular: Regular rate and rhythm without murmur gallop or rub normal S1 and S2, no peripheral edema or JVD Abdomen: Nontender, nondistended, soft, bowel sounds positive, no rebound, no ascites, no appreciable mass Musculoskeletal: No significant cyanosis, clubbing of bilateral lower extremities Neurological: Awake but remains  lethargic with mumbled speech  Scheduled Meds:  Scheduled Meds: . aztreonam  1 g Intravenous Q24H  . collagenase   Topical Daily  . heparin  5,000 Units Subcutaneous 3 times per day  . insulin aspart  0-9 Units Subcutaneous 6 times per day  . metoprolol  5 mg Intravenous 4 times per day  . sodium chloride  3 mL Intravenous Q12H  . vancomycin  750 mg Intravenous Q T,Th,Sa-HD   Continuous Infusions: . dextrose 5 % and 0.2 % NaCl 100 mL/hr at 10/03/13 1306    Data Reviewed: Basic Metabolic Panel:  Recent Labs Lab 10/01/13 1135 10/01/13 1136 10/02/13 0255 10/03/13 0304  NA 141 142 144 145  K 4.9 4.7 4.5 4.9  CL 92* 98 95* 95*  CO2 28  --  24 25  GLUCOSE 356* 369* 178* 160*  BUN 49* 56* 61* 85*  CREATININE 6.33* 7.40* 7.58* 9.88*  CALCIUM 11.2*  --  11.1* 11.3*   Liver Function Tests:  Recent Labs Lab 10/01/13 1135 10/02/13 0255  AST 35 30  ALT 13 12  ALKPHOS 75 79  BILITOT 0.5 0.5  PROT 10.4* 9.5*  ALBUMIN 3.4* 3.1*   No results found for this basename: LIPASE, AMYLASE,  in the last 168 hours  Recent Labs Lab 10/01/13 1201  AMMONIA 29   CBC:  Recent Labs Lab 10/01/13 1135 10/01/13 1136 10/02/13 1112 10/03/13 0304  WBC 12.6*  --  15.0* 12.5*  NEUTROABS 9.2*  --   --   --   HGB  13.7 16.7 13.6 12.7*  HCT 41.3 49.0 40.2 37.9*  MCV 86.8  --  85.4 84.4  PLT 262  --  258 306   Cardiac Enzymes:  Recent Labs Lab 10/01/13 1135 10/02/13 1630 10/02/13 2151 10/03/13 0304  CKTOTAL  --  351* 339* 345*  CKMB  --  2.2 2.5 2.9  TROPONINI <0.30 <0.30 <0.30 <0.30   BNP (last 3 results)  Recent Labs  10/01/13 1135  PROBNP 1397.0*   CBG:  Recent Labs Lab 10/02/13 2039 10/03/13 0012 10/03/13 0347 10/03/13 0746 10/03/13 1258  GLUCAP 190* 158* 154* 131* 106*    Recent Results (from the past 240 hour(s))  CULTURE, BLOOD (ROUTINE X 2)     Status: None   Collection Time    10/01/13 11:35 AM      Result Value Ref Range Status   Specimen  Description BLOOD RIGHT WRIST   Final   Special Requests BOTTLES DRAWN AEROBIC ONLY 10CC   Final   Culture  Setup Time     Final   Value: 10/01/2013 19:00     Performed at [facility]   Culture     Final   Value:        BLOOD CULTURE RECEIVED NO GROWTH TO DATE CULTURE WILL BE HELD FOR 5 DAYS BEFORE ISSUING A FINAL NEGATIVE REPORT     Performed at [facility]   Report Status PENDING   Incomplete  CULTURE, BLOOD (ROUTINE X 2)     Status: None   Collection Time    10/01/13 12:01 PM      Result Value Ref Range Status   Specimen Description BLOOD RIGHT FOREARM   Final   Special Requests BOTTLES DRAWN AEROBIC AND ANAEROBIC 5CC   Final   Culture  Setup Time     Final   Value: 10/01/2013 18:59     Performed at [facility]   Culture     Final   Value:        BLOOD CULTURE RECEIVED NO GROWTH TO DATE CULTURE WILL BE HELD FOR 5 DAYS BEFORE ISSUING A FINAL NEGATIVE REPORT     Performed at [facility]   Report Status PENDING   Incomplete  CSF CULTURE     Status: None   Collection Time    10/02/13 10:55 AM      Result Value Ref Range Status   Specimen Description CSF   Final   Special Requests Normal   Final   Gram Stain     Final   Value: FEW WBC PRESENT, PREDOMINANTLY MONONUCLEAR     NO ORGANISMS SEEN     Performed at [facility]   Culture     Final   Value: NO GROWTH     Performed at [facility]   Report Status PENDING   Incomplete  GRAM STAIN     Status: None   Collection Time    10/02/13 10:55 AM      Result Value Ref Range Status   Specimen Description CSF   Final   Special Requests Normal   Final   Gram Stain     Final   Value: CYTOSPIN PREP     WBC PRESENT, PREDOMINANTLY MONONUCLEAR     NO ORGANISMS SEEN   Report Status 10/02/2013 FINAL   Final  MRSA PCR SCREENING     Status: None   Collection Time    10/02/13 11:18 PM      Result  Value Ref Range Status   MRSA by PCR NEGATIVE  NEGATIVE Final   Comment:            The  GeneXpert MRSA Assay (FDA     approved for NASAL specimens     only), is one component of a     comprehensive MRSA colonization     surveillance program. It is not     intended to diagnose MRSA     infection nor to guide or     monitor treatment for     MRSA infections.     Studies:  Recent x-ray studies have been reviewed in detail by the Attending Physician  Time spent :     Erin Hearing, [place] [facility] Office  [phone] Pager [phone]  **If unable to reach the above provider after paging please contact the [place] @ [phone]  On-Call/Text Page:      [email or web address]      password [email or web address]  If 7PM-7AM, please contact night-coverage [email or web address] Password [email or web address] 10/03/2013, 5:07 PM   LOS: 2 days

## 2013-10-03 NOTE — Evaluation (Signed)
Clinical/Bedside Swallow Evaluation Patient Details  Name: Oscar Castillo MRN: [id number] Date of Birth: [birth year]  Today's Date: 10/03/2013 Time: 6812-7517 SLP Time Calculation (min): 17 min  Past Medical History:  Past Medical History  Diagnosis Date  . ESRD on hemodialysis 05/05/2007    ESRD due to DM/HTN. Started dialysis in November 2013.  HD TTS at [facility] on [place].  [name] BACK PAIN, LUMBAR, CHRONIC 08/06/2009  . BENIGN PROSTATIC HYPERTROPHY 08/01/2010  . CEREBROVASCULAR ACCIDENT, HX OF 08/06/2009  . CHOLELITHIASIS 08/01/2010  . CONGESTIVE HEART FAILURE 03/18/2009  . DEPRESSION 03/18/2009  . DIABETES MELLITUS, TYPE II 03/25/2007  . ERECTILE DYSFUNCTION 03/25/2007  . GERD 03/25/2007  . HEPATITIS C, HX OF 03/25/2007  . HYPERTENSION 03/25/2007  . Morbid obesity 03/25/2007  . NEPHROLITHIASIS, HX OF 03/18/2009  . Complication of anesthesia     wife states pt had trouble waking up with his last surgery in Nov., 2014   Past Surgical History:  Past Surgical History  Procedure Laterality Date  . Nephrectomy      partial RR  . Av fistula placement  06/14/2012    Procedure: ARTERIOVENOUS (AV) FISTULA CREATION;  Surgeon: Angelia Mould, MD;  Location: [facility] OR;  Service: Vascular;  Laterality: Left;  Left basilic vein transposition with fistula.  . Tibia im nail insertion Left 09/09/2012    Procedure: INTRAMEDULLARY (IM) NAIL TIBIAL;  Surgeon: Johnny Bridge, MD;  Location: [place];  Service: Orthopedics;  Laterality: Left;  left tibial nail and open reduction internal fixation left fibula fracture  . Orif fibula fracture Left 09/09/2012    Procedure: OPEN REDUCTION INTERNAL FIXATION (ORIF) FIBULA FRACTURE;  Surgeon: Johnny Bridge, MD;  Location: [place];  Service: Orthopedics;  Laterality: Left;  . Colonoscopy N/A 10/28/2012    Procedure: COLONOSCOPY;  Surgeon: Jeryl Columbia, MD;  Location: [facility] ENDOSCOPY;  Service: Endoscopy;  Laterality: N/A;  . Esophagogastroduodenoscopy N/A 11/02/2012     Procedure: ESOPHAGOGASTRODUODENOSCOPY (EGD);  Surgeon: Cleotis Nipper, MD;  Location: [facility] ENDOSCOPY;  Service: Endoscopy;  Laterality: N/A;  . Colonoscopy N/A 11/02/2012    Procedure: COLONOSCOPY;  Surgeon: Cleotis Nipper, MD;  Location: [facility] ENDOSCOPY;  Service: Endoscopy;  Laterality: N/A;  . Colonoscopy N/A 11/03/2012    Procedure: COLONOSCOPY;  Surgeon: Cleotis Nipper, MD;  Location[facility] ENDOSCOPY;  Service: Endoscopy;  Laterality: N/A;  . Givens capsule study N/A 11/04/2012    Procedure: GIVENS CAPSULE STUDY;  Surgeon: Cleotis Nipper, MD;  Location: [facility] ENDOSCOPY;  Service: Endoscopy;  Laterality: N/A;  . Enteroscopy N/A 11/08/2012    Procedure: ENTEROSCOPY;  Surgeon: Wonda Horner, MD;  Location: [facility] ENDOSCOPY;  Service: Endoscopy;  Laterality: N/A;  . Amputation Left 05/12/2013    Procedure: AMPUTATION RAY;  Surgeon: Newt Minion, MD;  Location: [place];  Service: Orthopedics;  Laterality: Left;  Left Foot 1st Ray Amputation  . Eye surgery Left     to remove scar tissue  . Amputation Left 06/09/2013    Procedure: AMPUTATION BELOW KNEE;  Surgeon: Newt Minion, MD;  Location: [place];  Service: Orthopedics;  Laterality: Left;  Left Below Knee Amputation and removal proximal screws IM tibial nail  . Hardware removal Left 06/09/2013    Procedure: HARDWARE REMOVAL;  Surgeon: Newt Minion, MD;  Location: [place];  Service: Orthopedics;  Laterality: Left;  Left Below Knee Amputation  and Removal proximal screws IM tibial nail  . Amputation Right 09/08/2013    Procedure: AMPUTATION  BELOW KNEE;  Surgeon: Newt Minion, MD;  Location: [place];  Service: Orthopedics;  Laterality: Right;  Right Below Knee Amputation   HPI:  63 y.o. year-old with hx of HTN, DM2, PVD with bilat BKA, BPH, depression, and hep C.  Patient was here 2/13 - 09/11/13 for R BKA.  Discharged to SNF. According to H&P patient was confused and not working wt PT at the SNF and a palliative care meeting was held with family on March 3rd due  to FTT and suspected depression.  Then he did a little better but was found unresponsive at SNF.  DX include encephalopathy, FTT, dehydration, fever of unknown etiology.  Per notes, pt has been on pureed diet at SNF, was beginning to hold foods orally.    Assessment / Plan / Recommendation Clinical Impression  Pt purportedly with baseline dysphagia, FTT, presents today with minimal participation, unable to follow commands.  Consumed limited POs (purees, thin liquids) with intermittent non-acceptance (lips sealed, resisting spoon.)  Required max verbal/tactile cues to manipulate bolus material.  Oral holding prevalent.  Swallow response was at times delayed by as much as one minute from time of bolus presentation.  There were no overt s/s of aspiration.  Cognitive impairment appears to be primary barrier.    Recommend initiating a dysphagia 1 diet, thin liquids with caution.  Pt will require careful hand-feeding, cues to chew and swallow, and oral check after eating to ensure absence of residue.  He is a high aspiration risk given current cognitive deficits.  Hold meal tray if pt is not sufficiently alert or participatory.      Aspiration Risk  Moderate    Diet Recommendation Dysphagia 1 (Puree);Thin liquid   Liquid Administration via: Spoon;Cup Medication Administration: Crushed with puree Supervision: Full supervision/cueing for compensatory strategies Compensations: Slow rate;Small sips/bites;Check for pocketing Postural Changes and/or Swallow Maneuvers: Seated upright 90 degrees    Other  Recommendations Oral Care Recommendations: Oral care BID   Follow Up Recommendations   (tba)    Frequency and Duration min 2x/week  1 week       SLP Swallow Goals   See care plan  Swallow Study Prior Functional Status       General   Type of Study: Bedside swallow evaluation Previous Swallow Assessment: none per records Diet Prior to this Study: NPO Temperature Spikes Noted:  Yes Respiratory Status: Room air History of Recent Intubation: No Behavior/Cognition: Confused;Requires cueing;Doesn't follow directions Oral Cavity - Dentition: Edentulous Self-Feeding Abilities: Total assist Patient Positioning: Partially reclined Baseline Vocal Quality: Clear Volitional Cough: Cognitively unable to elicit Volitional Swallow: Unable to elicit    Oral/Motor/Sensory Function Overall Oral Motor/Sensory Function: Impaired at baseline   Ice Chips Ice chips: Impaired Presentation: Spoon Oral Phase Impairments: Impaired anterior to posterior transit;Impaired mastication Oral Phase Functional Implications: Prolonged oral transit;Oral holding Pharyngeal Phase Impairments: Suspected delayed Swallow   Thin Liquid Thin Liquid: Impaired Presentation: Spoon Oral Phase Impairments: Poor awareness of bolus Oral Phase Functional Implications: Prolonged oral transit;Oral holding Pharyngeal  Phase Impairments: Suspected delayed Swallow    Nectar Thick Nectar Thick Liquid: Not tested   Honey Thick Honey Thick Liquid: Not tested   Puree Puree: Impaired Presentation: Spoon Oral Phase Impairments: Impaired mastication Oral Phase Functional Implications: Prolonged oral transit;Oral holding Pharyngeal Phase Impairments: Suspected delayed Swallow   Solid   Milad Bublitz L. Adams, [place] CCC/SLP Pager [phone]     Solid: Not tested       Juan Quam Laurice 10/03/2013,2:19 PM

## 2013-10-04 ENCOUNTER — Inpatient Hospital Stay (HOSPITAL_COMMUNITY): Payer: Medicare Other

## 2013-10-04 DIAGNOSIS — E860 Dehydration: Secondary | ICD-10-CM

## 2013-10-04 LAB — RENAL FUNCTION PANEL
Albumin: 3.1 g/dL — ABNORMAL LOW (ref 3.5–5.2)
BUN: 44 mg/dL — ABNORMAL HIGH (ref 6–23)
CALCIUM: 10.5 mg/dL (ref 8.4–10.5)
CO2: 28 meq/L (ref 19–32)
CREATININE: 5.8 mg/dL — AB (ref 0.50–1.35)
Chloride: 88 mEq/L — ABNORMAL LOW (ref 96–112)
GFR calc Af Amer: 11 mL/min — ABNORMAL LOW (ref >90)
GFR calc non Af Amer: 9 mL/min — ABNORMAL LOW (ref >90)
Glucose, Bld: 174 mg/dL — ABNORMAL HIGH (ref 70–99)
Phosphorus: 5.1 mg/dL — ABNORMAL HIGH (ref 2.3–4.6)
Potassium: 4.2 mEq/L (ref 3.7–5.3)
Sodium: 135 mEq/L — ABNORMAL LOW (ref 137–147)

## 2013-10-04 LAB — CBC
HCT: 37.5 % — ABNORMAL LOW (ref 39.0–52.0)
HEMOGLOBIN: 12.5 g/dL — AB (ref 13.0–17.0)
MCH: 28.2 pg (ref 26.0–34.0)
MCHC: 33.3 g/dL (ref 30.0–36.0)
MCV: 84.7 fL (ref 78.0–100.0)
PLATELETS: 308 10*3/uL (ref 150–400)
RBC: 4.43 MIL/uL (ref 4.22–5.81)
RDW: 18.4 % — ABNORMAL HIGH (ref 11.5–15.5)
WBC: 11 10*3/uL — ABNORMAL HIGH (ref 4.0–10.5)

## 2013-10-04 LAB — GLUCOSE, CAPILLARY
GLUCOSE-CAPILLARY: 150 mg/dL — AB (ref 70–99)
GLUCOSE-CAPILLARY: 200 mg/dL — AB (ref 70–99)
Glucose-Capillary: 155 mg/dL — ABNORMAL HIGH (ref 70–99)
Glucose-Capillary: 162 mg/dL — ABNORMAL HIGH (ref 70–99)
Glucose-Capillary: 188 mg/dL — ABNORMAL HIGH (ref 70–99)
Glucose-Capillary: 195 mg/dL — ABNORMAL HIGH (ref 70–99)
Glucose-Capillary: 199 mg/dL — ABNORMAL HIGH (ref 70–99)

## 2013-10-04 MED ORDER — LABETALOL HCL 300 MG PO TABS
300.0000 mg | ORAL_TABLET | Freq: Two times a day (BID) | ORAL | Status: DC
  Administered 2013-10-04 – 2013-10-16 (×18): 300 mg via ORAL
  Filled 2013-10-04 (×27): qty 1

## 2013-10-04 MED ORDER — BACITRACIN-NEOMYCIN-POLYMYXIN OINTMENT TUBE
TOPICAL_OINTMENT | Freq: Three times a day (TID) | CUTANEOUS | Status: DC
  Administered 2013-10-04: via TOPICAL
  Administered 2013-10-05: 1 via TOPICAL
  Administered 2013-10-05 – 2013-10-19 (×35): via TOPICAL
  Administered 2013-10-19: 1 via TOPICAL
  Administered 2013-10-20 – 2013-10-31 (×32): via TOPICAL
  Filled 2013-10-04 (×2): qty 15

## 2013-10-04 MED ORDER — NEPRO/CARBSTEADY PO LIQD
237.0000 mL | Freq: Two times a day (BID) | ORAL | Status: DC
  Administered 2013-10-04 – 2013-10-14 (×12): 237 mL via ORAL

## 2013-10-04 MED ORDER — ACETAMINOPHEN 325 MG PO TABS
650.0000 mg | ORAL_TABLET | Freq: Four times a day (QID) | ORAL | Status: DC | PRN
  Administered 2013-10-07 – 2013-10-26 (×6): 650 mg via ORAL
  Filled 2013-10-04 (×8): qty 2

## 2013-10-04 MED ORDER — OXYCODONE HCL 5 MG PO TABS
5.0000 mg | ORAL_TABLET | Freq: Four times a day (QID) | ORAL | Status: DC | PRN
  Administered 2013-10-05 – 2013-10-07 (×3): 5 mg via ORAL
  Filled 2013-10-04 (×2): qty 1

## 2013-10-04 NOTE — Progress Notes (Signed)
ANTIBIOTIC CONSULT NOTE - Follow-up  Pharmacy Consult for Vancomycin and Aztreonam Indication: pneumonia, ?stump wound infxn  No Known Allergies  Patient Measurements: Height: 4\' 8"  (142.2 cm) Weight: 172 lb 6.4 oz (78.2 kg) IBW/kg (Calculated) : 40.8  Recent Labs  10/02/13 0255 10/02/13 1112 10/03/13 0304 10/04/13 0429  WBC  --  15.0* 12.5* 11.0*  HGB  --  13.6 12.7* 12.5*  PLT  --  258 306 308  CREATININE 7.58*  --  9.88* 5.80*   Estimated Creatinine Clearance: 10.4 ml/min (by C-G formula based on Cr of 5.8). No results found for this basename: VANCOTROUGH, VANCOPEAK, VANCORANDOM, GENTTROUGH, GENTPEAK, GENTRANDOM, TOBRATROUGH, TOBRAPEAK, TOBRARND, AMIKACINPEAK, AMIKACINTROU, AMIKACIN,  in the last 72 hours   Microbiology: Recent Results (from the past 720 hour(s))  CULTURE, BLOOD (ROUTINE X 2)     Status: None   Collection Time    10/01/13 11:35 AM      Result Value Ref Range Status   Specimen Description BLOOD RIGHT WRIST   Final   Special Requests BOTTLES DRAWN AEROBIC ONLY 10CC   Final   Culture  Setup Time     Final   Value: 10/01/2013 19:00     Performed at [facility]   Culture     Final   Value:        BLOOD CULTURE RECEIVED NO GROWTH TO DATE CULTURE WILL BE HELD FOR 5 DAYS BEFORE ISSUING A FINAL NEGATIVE REPORT     Performed at [facility]   Report Status PENDING   Incomplete  CULTURE, BLOOD (ROUTINE X 2)     Status: None   Collection Time    10/01/13 12:01 PM      Result Value Ref Range Status   Specimen Description BLOOD RIGHT FOREARM   Final   Special Requests BOTTLES DRAWN AEROBIC AND ANAEROBIC 5CC   Final   Culture  Setup Time     Final   Value: 10/01/2013 18:59     Performed at [facility]   Culture     Final   Value:        BLOOD CULTURE RECEIVED NO GROWTH TO DATE CULTURE WILL BE HELD FOR 5 DAYS BEFORE ISSUING A FINAL NEGATIVE REPORT     Performed at [facility]   Report Status PENDING   Incomplete  CSF CULTURE      Status: None   Collection Time    10/02/13 10:55 AM      Result Value Ref Range Status   Specimen Description CSF   Final   Special Requests Normal   Final   Gram Stain     Final   Value: FEW WBC PRESENT, PREDOMINANTLY MONONUCLEAR     NO ORGANISMS SEEN     Performed at [facility]   Culture     Final   Value: NO GROWTH     Performed at [facility]   Report Status PENDING   Incomplete  GRAM STAIN     Status: None   Collection Time    10/02/13 10:55 AM      Result Value Ref Range Status   Specimen Description CSF   Final   Special Requests Normal   Final   Gram Stain     Final   Value: CYTOSPIN PREP     WBC PRESENT, PREDOMINANTLY MONONUCLEAR     NO ORGANISMS SEEN   Report Status 10/02/2013 FINAL   Final  MRSA PCR SCREENING  Status: None   Collection Time    10/02/13 11:18 PM      Result Value Ref Range Status   MRSA by PCR NEGATIVE  NEGATIVE Final   Comment:            The GeneXpert MRSA Assay (FDA     approved for NASAL specimens     only), is one component of a     comprehensive MRSA colonization     surveillance program. It is not     intended to diagnose MRSA     infection nor to guide or     monitor treatment for     MRSA infections.    Assessment: 63 year old male from NH with recent admit in Feb 2015 for R gangrenous foot. Now admitted with AMS and initiated on broad-spectrum antibiotics for possible HCAP and stump wound infxn. Today is D#4 of therapy. Doses remain appropriate for dialysis. Pt is afebrile and WBC is mildly elevated at 11.   Aztreo 3/8>> Vanc 3/8>>  3/9 CSF - NGTD 3/9 MRSA - NEG 3/8 Blood - NGTD  Goal of Therapy:  Pre-HD Vancomycin level 15-25 mcg/ml  Plan:  1. Continue aztreonam 1gm IV Q24H 2. Continue vanc 750mg  IV post-HD 3. F/u renal plans, C&S, clinical status and pre-HD level when appropriate 4. Consider de-escalation when appropriate  Salome Arnt, PharmD, BCPS Pager # [phone] 10/04/2013 11:10  AM

## 2013-10-04 NOTE — Progress Notes (Signed)
  [facility] Progress Note   Subjective: opens eyes, attempts to speak, confused  Filed Vitals:   10/04/13 0400 10/04/13 0700 10/04/13 0800 10/04/13 1000  BP: 120/81 131/76 132/85 129/78  Pulse: 81 77 77 79  Temp: 98.4 F (36.9 C)  97.9 F (36.6 C)   TempSrc: Oral  Oral   Resp: 15 18 18 14   Height:      Weight:      SpO2: 100% 100% 100% 100%   Exam: Opens eyes to voice, does not verbalize, falls back asleep No jvd  Chest clear bilat  RRR no MRG  ABd soft, scaphoid, no ascites or tenderness , ND  Bilat LE BKA, no edema LUA fistula patent  CXR negative  ECHO 10/01/13 LV and RV normal fxn  Head CT negative   [facility]  4h 50min 72.5kg 2/2.0 [place] LUA AVF Heparin 5000 / 2400  Hectorol 1 Epo none Venofer none   Assessment:  1 Altered mental status- no major improvement, infection + general FTT 2 ESRD on hemodialysis  3 Fever, possible stump wound infection- on vanc + aztreonam IV, blood cx's neg 4 PVD / recent R BKA 2/13  5 DM on insulin  6 Vol depletion / hypernatremia- improving, BP's up, doubt accuracy of wt's 7 2HPT cont phoslo, hold vit D (^Ca)  8 Hep C  9 Hx CVA 10 Anemia Hb 12.5, no esa  11 FTT / debility / recent depression after 2nd leg amp- agree with palliative care approach if not greatly improving   Plan-  HD tomorrow, change IVF to D5 at 50/hr.      Kelly Splinter MD  pager [phone]    cell [phone]  10/04/2013, 10:50 AM     Recent Labs Lab 10/02/13 0255 10/03/13 0304 10/04/13 0429  NA 144 145 135*  K 4.5 4.9 4.2  CL 95* 95* 88*  CO2 24 25 28   GLUCOSE 178* 160* 174*  BUN 61* 85* 44*  CREATININE 7.58* 9.88* 5.80*  CALCIUM 11.1* 11.3* 10.5  PHOS  --   --  5.1*    Recent Labs Lab 10/01/13 1135 10/02/13 0255 10/04/13 0429  AST 35 30  --   ALT 13 12  --   ALKPHOS 75 79  --   BILITOT 0.5 0.5  --   PROT 10.4* 9.5*  --   ALBUMIN 3.4* 3.1* 3.1*    Recent Labs Lab 10/01/13 1135  10/02/13 1112 10/03/13 0304  10/04/13 0429  WBC 12.6*  --  15.0* 12.5* 11.0*  NEUTROABS 9.2*  --   --   --   --   HGB 13.7  < > 13.6 12.7* 12.5*  HCT 41.3  < > 40.2 37.9* 37.5*  MCV 86.8  --  85.4 84.4 84.7  PLT 262  --  258 306 308  < > = values in this interval not displayed. [name] aztreonam  1 g Intravenous Q24H  . collagenase   Topical Daily  . feeding supplement (NEPRO CARB STEADY)  237 mL Oral BID BM  . heparin  5,000 Units Subcutaneous 3 times per day  . insulin aspart  0-9 Units Subcutaneous 6 times per day  . metoprolol  5 mg Intravenous 4 times per day  . sodium chloride  3 mL Intravenous Q12H  . vancomycin  750 mg Intravenous Q T,Th,Sa-HD   . dextrose 5 % and 0.2 % NaCl 50 mL/hr at 10/04/13 1024   acetaminophen, oxyCODONE

## 2013-10-04 NOTE — Progress Notes (Signed)
[facility] - Stepdown / ICU Progress Note  SABA NEUMAN LFY:[id number] DOB: October 30, 1950 DOA: 10/01/2013 PCP: [name], MD  Brief narrative: 63 year old male patient vasculopath with recent right BKA on 08/2013. He also has a prior history of CVA in 2009 and is on chronic hemodialysis. In addition he has combined systolic& diastolic heart failure with an EF of 45%. According to the patient's wife he had been doing well in the postoperative period until around the same time he followed up with Dr. Sharol Given. At that time he started with poor intake and gradually increasing altered mentation. 09/24/2013 he was very confused when not work with the therapist. On 09/26/2013 a meeting was called at the nursing facility because he was experiencing extreme symptoms of failure to thrive and they were concerned the patient was depressed. Palliative care team had apparently been meeting with the family prior to admission. By 09/29/2013 he was a little more coherent and began trying to eat but later in the morning he would not take his meds kept his mouth shut.  Postoperatively he has had 2 courses of antibiotics for concerns of possible stump infection. In regards to dietary intake: in addition to the above the patient has been on a dysphagia diet with pured foods begun after the patient was noted to be coughing with attempts to eat.   Upon presentation to the emergency department EMS reported that the wife found food in the patient's mouth and he was unresponsive. His CBG was 379. He was hypoxemic and placed on a nonrebreather mask. His hemoglobin at presentation was 16.7 which was markedly elevated compared to prior readings between 9.9 and 11.5. He was also noted to have significant decreased breath sounds on the right without wheezing or crackles. The right BKA stump was unremarkable in appearance. Was also found to be mildly febrile with a rectal temperature of 101. Chest x-ray was concerning for possible  right lower lobe pneumonia.  Assessment/Plan: Active Problems:    Encephalopathy, metabolic -much more alert today- answering questions easily- remains confused -Suspect dehydration and possible underlying infectious processes as etiology   New LBBB -has been transient in nature -systolic function normal but septal motion with abnormal function and dyssynergy -cardiac enzymes negative -doubt pt appropriate for invasive cardiac work up     Dehydration /FTT (failure to thrive) in adult  -Continue IV fluid-type of fluid adjusted by nephrology today -SLP attempted 10/02/2013 the patient unable to arouse enough to eval but later re examined and now on D1 diet -BP up so decrease IVF to 50/hr- no UF with HD for now -had anorexia acutely pre admit and concern over ? Constipation so will check KUB    Fever -Unclear etiology- suspect DH and atelectasis -question of right lower lobe right but currently is not hypoxic -He did have leukocytosis so continue empiric antibiotics as precaution -Patient unable to contribute to history and given altered mentation and fever an LP was done to rule out possible meningitis-CSF studies pending-partial results with 35 of protein and 156 of glucose so not indicative of bacterial meningitis-CSF culture no growth -Influenza PCR negative -Medication reconciliation list that patient was on doxycycline prior to admission which was started on 09/29/2013 or suspected postoperative wound infection/possible osteomyelitis and scheduled to go for 3 weeks -Blood cultures no growth to date urinalysis unremarkable -Recent surgical site appears unremarkable    HYPERTENSION -Blood pressure controlled -on IV Lopressor every 6 hours -Was on Normodyne prior to admission- will resume  Anemia due to chronic illness -Hemoglobin has trended down to 13 after hydration noting baseline just under 11    DM (diabetes mellitus) type I controlled with renal manifestation -On  Lantus prior to admission-consider adding back a low dose Lantus -Continue sliding scale insulin -CBGs remained controlled    CKD (chronic kidney disease) stage V requiring chronic dialysis -Usual dialysis days Tuesday Thursday Saturday -No ultrafiltration secondary to ongoing dehydration    Chronic combined systolic (EF 81%) and grade 2 diastolic congestive heart failure -Compensated and managed with dialysis    HEPATITIS C, HX OF    CEREBROVASCULAR ACCIDENT, HX OF    BENIGN PROSTATIC HYPERTROPHY    Hyperlipidemia   DVT prophylaxis: Subcutaneous heparin Code Status: Full Family Communication: No family at bedside Disposition Plan/Expected LOS: Transfer to floor   Consultants: Nephrology Radiology for lumbar puncture  Procedures: Lumbar puncture by interventional radiology  Antibiotics: Aztreonam 3/08 >>> Vancomycin 3/08 >>>  HPI/Subjective: Patient alert and talking today  Objective: Blood pressure 129/78, pulse 79, temperature 97.9 F (36.6 C), temperature source Oral, resp. rate 14, height 4\' 8"  (1.422 m), weight 172 lb 6.4 oz (78.2 kg), SpO2 100.00%.  Intake/Output Summary (Last 24 hours) at 10/04/13 1032 Last data filed at 10/04/13 1024  Gross per 24 hour  Intake   2410 ml  Output   -165 ml  Net   2575 ml     Exam: General: No acute respiratory distress- dry and somewhat toxic in appearance Lungs: Clear to auscultation bilaterally without wheezes or crackles, RA Cardiovascular: Regular rate and rhythm without murmur gallop or rub normal S1 and S2, no peripheral edema or JVD Abdomen: Nontender, nondistended, soft, bowel sounds positive, no rebound, no ascites, no appreciable mass Musculoskeletal: No significant cyanosis, clubbing of bilateral lower extremities Neurological: Awake and talking but confused- ? Near baseline  Scheduled Meds:  Scheduled Meds: . aztreonam  1 g Intravenous Q24H  . collagenase   Topical Daily  . feeding supplement (NEPRO  CARB STEADY)  237 mL Oral BID BM  . heparin  5,000 Units Subcutaneous 3 times per day  . insulin aspart  0-9 Units Subcutaneous 6 times per day  . metoprolol  5 mg Intravenous 4 times per day  . sodium chloride  3 mL Intravenous Q12H  . vancomycin  750 mg Intravenous Q T,Th,Sa-HD   Continuous Infusions: . dextrose 5 % and 0.2 % NaCl 50 mL/hr at 10/04/13 1024    Data Reviewed: Basic Metabolic Panel:  Recent Labs Lab 10/01/13 1135 10/01/13 1136 10/02/13 0255 10/03/13 0304 10/04/13 0429  NA 141 142 144 145 135*  K 4.9 4.7 4.5 4.9 4.2  CL 92* 98 95* 95* 88*  CO2 28  --  24 25 28   GLUCOSE 356* 369* 178* 160* 174*  BUN 49* 56* 61* 85* 44*  CREATININE 6.33* 7.40* 7.58* 9.88* 5.80*  CALCIUM 11.2*  --  11.1* 11.3* 10.5  PHOS  --   --   --   --  5.1*   Liver Function Tests:  Recent Labs Lab 10/01/13 1135 10/02/13 0255 10/04/13 0429  AST 35 30  --   ALT 13 12  --   ALKPHOS 75 79  --   BILITOT 0.5 0.5  --   PROT 10.4* 9.5*  --   ALBUMIN 3.4* 3.1* 3.1*   No results found for this basename: LIPASE, AMYLASE,  in the last 168 hours  Recent Labs Lab 10/01/13 1201  AMMONIA 29   CBC:  Recent  Labs Lab 10/01/13 1135 10/01/13 1136 10/02/13 1112 10/03/13 0304 10/04/13 0429  WBC 12.6*  --  15.0* 12.5* 11.0*  NEUTROABS 9.2*  --   --   --   --   HGB 13.7 16.7 13.6 12.7* 12.5*  HCT 41.3 49.0 40.2 37.9* 37.5*  MCV 86.8  --  85.4 84.4 84.7  PLT 262  --  258 306 308   Cardiac Enzymes:  Recent Labs Lab 10/01/13 1135 10/02/13 1630 10/02/13 2151 10/03/13 0304  CKTOTAL  --  351* 339* 345*  CKMB  --  2.2 2.5 2.9  TROPONINI <0.30 <0.30 <0.30 <0.30   BNP (last 3 results)  Recent Labs  10/01/13 1135  PROBNP 1397.0*   CBG:  Recent Labs Lab 10/03/13 2113 10/04/13 0031 10/04/13 0046 10/04/13 0428 10/04/13 0809  GLUCAP 161* 155* 150* 188* 200*    Recent Results (from the past 240 hour(s))  CULTURE, BLOOD (ROUTINE X 2)     Status: None   Collection Time     10/01/13 11:35 AM      Result Value Ref Range Status   Specimen Description BLOOD RIGHT WRIST   Final   Special Requests BOTTLES DRAWN AEROBIC ONLY 10CC   Final   Culture  Setup Time     Final   Value: 10/01/2013 19:00     Performed at [facility]   Culture     Final   Value:        BLOOD CULTURE RECEIVED NO GROWTH TO DATE CULTURE WILL BE HELD FOR 5 DAYS BEFORE ISSUING A FINAL NEGATIVE REPORT     Performed at [facility]   Report Status PENDING   Incomplete  CULTURE, BLOOD (ROUTINE X 2)     Status: None   Collection Time    10/01/13 12:01 PM      Result Value Ref Range Status   Specimen Description BLOOD RIGHT FOREARM   Final   Special Requests BOTTLES DRAWN AEROBIC AND ANAEROBIC 5CC   Final   Culture  Setup Time     Final   Value: 10/01/2013 18:59     Performed at [facility]   Culture     Final   Value:        BLOOD CULTURE RECEIVED NO GROWTH TO DATE CULTURE WILL BE HELD FOR 5 DAYS BEFORE ISSUING A FINAL NEGATIVE REPORT     Performed at [facility]   Report Status PENDING   Incomplete  CSF CULTURE     Status: None   Collection Time    10/02/13 10:55 AM      Result Value Ref Range Status   Specimen Description CSF   Final   Special Requests Normal   Final   Gram Stain     Final   Value: FEW WBC PRESENT, PREDOMINANTLY MONONUCLEAR     NO ORGANISMS SEEN     Performed at [facility]   Culture     Final   Value: NO GROWTH     Performed at [facility]   Report Status PENDING   Incomplete  GRAM STAIN     Status: None   Collection Time    10/02/13 10:55 AM      Result Value Ref Range Status   Specimen Description CSF   Final   Special Requests Normal   Final   Gram Stain     Final   Value: CYTOSPIN PREP     WBC PRESENT, PREDOMINANTLY MONONUCLEAR  NO ORGANISMS SEEN   Report Status 10/02/2013 FINAL   Final  MRSA PCR SCREENING     Status: None   Collection Time    10/02/13 11:18 PM      Result Value Ref Range Status    MRSA by PCR NEGATIVE  NEGATIVE Final   Comment:            The GeneXpert MRSA Assay (FDA     approved for NASAL specimens     only), is one component of a     comprehensive MRSA colonization     surveillance program. It is not     intended to diagnose MRSA     infection nor to guide or     monitor treatment for     MRSA infections.     Studies:  Recent x-ray studies have been reviewed in detail by the Attending Physician  Time spent :     Erin Hearing, [facility]  [phone] Pager [phone]  **If unable to reach the above provider after paging please contact the [place] @ [phone]  On-Call/Text Page:      [email or web address]      password [email or web address]  If 7PM-7AM, please contact night-coverage [email or web address] Password [email or web address] 10/04/2013, 10:32 AM   LOS: 3 days    I have examined the patient, reviewed the chart and modified the above note which I agree with.   Brennan Karam,MD Pager # on [email or web address] 10/04/2013, 3:46 PM

## 2013-10-04 NOTE — Progress Notes (Signed)
Patient ID: Oscar Castillo, male   DOB: [birth year], 63 y.o.   MRN: [id number] No drainage from the right transtibial amputation. With manipulation I cannot express any fluid and cannot feel a fluid pocket. Left transtibial amputation is also stable with no open wounds. There is ischemic eschar on both amputations.

## 2013-10-04 NOTE — Progress Notes (Signed)
Patient ID: Oscar Castillo, male   DOB: September 23, 1950, 63 y.o.   MRN: [id number]     [facility]  No Known Allergies   Chief Complaint  Patient presents with  . Acute Visit    family meeting     HPI:  We have a had a care plan meeting to discuss his overall status. He is continuing to decline. He is more confused;is more lethargic; has a poor appetite with poor fluid intake. He is unable to participate with therapy. His wife understands that he may not be able to improve. He did not share his illness with his family other than his wife.    Past Medical History  Diagnosis Date  . ESRD on hemodialysis 05/05/2007    ESRD due to DM/HTN. Started dialysis in November 2013.  HD TTS at [facility] on [place].  [name] BACK PAIN, LUMBAR, CHRONIC 08/06/2009  . BENIGN PROSTATIC HYPERTROPHY 08/01/2010  . CEREBROVASCULAR ACCIDENT, HX OF 08/06/2009  . CHOLELITHIASIS 08/01/2010  . CONGESTIVE HEART FAILURE 03/18/2009  . DEPRESSION 03/18/2009  . DIABETES MELLITUS, TYPE II 03/25/2007  . ERECTILE DYSFUNCTION 03/25/2007  . GERD 03/25/2007  . HEPATITIS C, HX OF 03/25/2007  . HYPERTENSION 03/25/2007  . Morbid obesity 03/25/2007  . NEPHROLITHIASIS, HX OF 03/18/2009  . Complication of anesthesia     wife states pt had trouble waking up with his last surgery in Nov., 2014    Past Surgical History  Procedure Laterality Date  . Nephrectomy      partial RR  . Av fistula placement  06/14/2012    Procedure: ARTERIOVENOUS (AV) FISTULA CREATION;  Surgeon: Angelia Mould, MD;  Location: [facility] OR;  Service: Vascular;  Laterality: Left;  Left basilic vein transposition with fistula.  . Tibia im nail insertion Left 09/09/2012    Procedure: INTRAMEDULLARY (IM) NAIL TIBIAL;  Surgeon: Johnny Bridge, MD;  Location: [place];  Service: Orthopedics;  Laterality: Left;  left tibial nail and open reduction internal fixation left fibula fracture  . Orif fibula fracture Left 09/09/2012    Procedure: OPEN REDUCTION INTERNAL FIXATION  (ORIF) FIBULA FRACTURE;  Surgeon: Johnny Bridge, MD;  Location: [place];  Service: Orthopedics;  Laterality: Left;  . Colonoscopy N/A 10/28/2012    Procedure: COLONOSCOPY;  Surgeon: Jeryl Columbia, MD;  Location: [facility] ENDOSCOPY;  Service: Endoscopy;  Laterality: N/A;  . Esophagogastroduodenoscopy N/A 11/02/2012    Procedure: ESOPHAGOGASTRODUODENOSCOPY (EGD);  Surgeon: Cleotis Nipper, MD;  Location: [facility] ENDOSCOPY;  Service: Endoscopy;  Laterality: N/A;  . Colonoscopy N/A 11/02/2012    Procedure: COLONOSCOPY;  Surgeon: Cleotis Nipper, MD;  Location: [facility] ENDOSCOPY;  Service: Endoscopy;  Laterality: N/A;  . Colonoscopy N/A 11/03/2012    Procedure: COLONOSCOPY;  Surgeon: Cleotis Nipper, MD;  Location: [facility] ENDOSCOPY;  Service: Endoscopy;  Laterality: N/A;  . Givens capsule study N/A 11/04/2012    Procedure: GIVENS CAPSULE STUDY;  Surgeon: Cleotis Nipper, MD;  Location: [facility] ENDOSCOPY;  Service: Endoscopy;  Laterality: N/A;  . Enteroscopy N/A 11/08/2012    Procedure: ENTEROSCOPY;  Surgeon: Wonda Horner, MD;  Location: [facility] ENDOSCOPY;  Service: Endoscopy;  Laterality: N/A;  . Amputation Left 05/12/2013    Procedure: AMPUTATION RAY;  Surgeon: Newt Minion, MD;  Location: [place];  Service: Orthopedics;  Laterality: Left;  Left Foot 1st Ray Amputation  . Eye surgery Left     to remove scar tissue  . Amputation Left 06/09/2013    Procedure: AMPUTATION BELOW KNEE;  Surgeon: Newt Minion, MD;  Location: [place];  Service: Orthopedics;  Laterality: Left;  Left Below Knee Amputation and removal proximal screws IM tibial nail  . Hardware removal Left 06/09/2013    Procedure: HARDWARE REMOVAL;  Surgeon: Newt Minion, MD;  Location: [place];  Service: Orthopedics;  Laterality: Left;  Left Below Knee Amputation  and Removal proximal screws IM tibial nail  . Amputation Right 09/08/2013    Procedure: AMPUTATION BELOW KNEE;  Surgeon: Newt Minion, MD;  Location: [place];  Service: Orthopedics;  Laterality: Right;  Right Below Knee  Amputation    VITAL SIGNS BP 139/65  Pulse 73  Ht 4\' 8"  (1.422 m)  Wt 166 lb 6.4 oz (75.479 kg)  BMI 37.33 kg/m2   Patient's Medications  New Prescriptions   No medications on file  Previous Medications   ACETAMINOPHEN (TYLENOL) 500 MG TABLET    Take 500 mg by mouth every 6 (six) hours as needed for mild pain.   AMLODIPINE (NORVASC) 10 MG TABLET    Take 10 mg by mouth daily.   BRIMONIDINE (ALPHAGAN) 0.2 % OPHTHALMIC SOLUTION    Place 1 drop into both eyes 2 (two) times daily.   BRINZOLAMIDE-BRIMONIDINE (SIMBRINZA) 1-0.2 % SUSP    Place 1 drop into both eyes 2 (two) times daily.    CALCIUM ACETATE (PHOSLO) 667 MG CAPSULE    Take 2 capsules (1,334 mg total) by mouth 3 (three) times daily with meals.   DOCUSATE SODIUM (COLACE) 100 MG CAPSULE    Take 1 capsule (100 mg total) by mouth 2 (two) times daily.   GABAPENTIN (NEURONTIN) 300 MG CAPSULE    Take 300 mg by mouth daily as needed (for mild to moderate pain).    HALOPERIDOL (HALDOL) 5 MG TABLET    Take 5 mg by mouth at bedtime as needed for agitation.   HYDROCODONE-ACETAMINOPHEN (NORCO) 7.5-325 MG PER TABLET    Take 1 tablet by mouth every 6 (six) hours as needed for moderate pain.   INSULIN GLARGINE (LANTUS SOLOSTAR [place])    Inject 5 Units into the skin every evening.   LABETALOL (NORMODYNE) 300 MG TABLET    Take 1 tablet (300 mg total) by mouth 3 (three) times daily.   SERTRALINE (ZOLOFT) 100 MG TABLET    Take 100 mg by mouth daily as needed. For depression   SILVER SULFADIAZINE (SILVADENE) 1 % CREAM    Apply 1 application topically daily.   TRAVOPROST, BAK FREE, (TRAVATAN) 0.004 % SOLN OPHTHALMIC SOLUTION    Place 1 drop into both eyes at bedtime.   TRAZODONE (DESYREL) 50 MG TABLET    Take 50 mg by mouth at bedtime as needed for sleep.  Modified Medications   No medications on file  Discontinued Medications   No medications on file    SIGNIFICANT DIAGNOSTIC EXAMS  NONE DURING THIS HOSPITALIZATION   LABS REVIEWED:  09-08-13:  wbc 9.4; hgb 12.3; hct 36.6; mcv 84.3; plt 319; glucose 181; bun 28; creat 8.2; k+4.2; na++141; liver normal albumin 3.3     Review of Systems  Unable to perform    Physical Exam  Constitutional: He is oriented to person, place, and time. He appears well-developed and well-nourished. No distress.  Neck: Neck supple. No JVD present.  Cardiovascular: Normal rate and regular rhythm.   Respiratory: Effort normal and breath sounds normal. No respiratory distress. He has no wheezes.  GI: Soft. Bowel sounds are normal. He exhibits no distension. There is no  tenderness.  Musculoskeletal:  Is able to move extremities  Bilateral bka  Neurological: he is lethargic; knows name; is not oriented to surroundings.  Skin: Skin is warm and dry. He is not diaphoretic.  Right bka: without signs of infection present  Sacral wound: with slough present no signs of infection present.  Left upper arm a/v shunt: +thrill + bruit        ASSESSMENT/ PLAN:   At this time will continue his current plan of care; will await the results of his ct of his head. Will setup a psych consult and a palliative care consult. Will continue to monitor his status.     Time spent with family 40 minutes.     Ok Edwards NP [facility]  Contact [phone] Monday through Friday 8am- 5pm  After hours call [phone]

## 2013-10-05 DIAGNOSIS — I10 Essential (primary) hypertension: Secondary | ICD-10-CM

## 2013-10-05 DIAGNOSIS — R627 Adult failure to thrive: Secondary | ICD-10-CM

## 2013-10-05 DIAGNOSIS — F329 Major depressive disorder, single episode, unspecified: Secondary | ICD-10-CM

## 2013-10-05 DIAGNOSIS — I5041 Acute combined systolic (congestive) and diastolic (congestive) heart failure: Secondary | ICD-10-CM

## 2013-10-05 DIAGNOSIS — F3289 Other specified depressive episodes: Secondary | ICD-10-CM

## 2013-10-05 DIAGNOSIS — F05 Delirium due to known physiological condition: Secondary | ICD-10-CM

## 2013-10-05 DIAGNOSIS — S88119A Complete traumatic amputation at level between knee and ankle, unspecified lower leg, initial encounter: Secondary | ICD-10-CM

## 2013-10-05 LAB — CBC WITH DIFFERENTIAL/PLATELET
BASOS ABS: 0 10*3/uL (ref 0.0–0.1)
BASOS PCT: 0 % (ref 0–1)
Eosinophils Absolute: 0.1 10*3/uL (ref 0.0–0.7)
Eosinophils Relative: 1 % (ref 0–5)
HEMATOCRIT: 38.2 % — AB (ref 39.0–52.0)
Hemoglobin: 12.6 g/dL — ABNORMAL LOW (ref 13.0–17.0)
Lymphocytes Relative: 21 % (ref 12–46)
Lymphs Abs: 1.8 10*3/uL (ref 0.7–4.0)
MCH: 27.3 pg (ref 26.0–34.0)
MCHC: 33 g/dL (ref 30.0–36.0)
MCV: 82.9 fL (ref 78.0–100.0)
MONO ABS: 1.1 10*3/uL — AB (ref 0.1–1.0)
MONOS PCT: 12 % (ref 3–12)
Neutro Abs: 5.8 10*3/uL (ref 1.7–7.7)
Neutrophils Relative %: 66 % (ref 43–77)
Platelets: 310 10*3/uL (ref 150–400)
RBC: 4.61 MIL/uL (ref 4.22–5.81)
RDW: 18 % — AB (ref 11.5–15.5)
WBC: 8.8 10*3/uL (ref 4.0–10.5)

## 2013-10-05 LAB — COMPREHENSIVE METABOLIC PANEL
ALBUMIN: 2.7 g/dL — AB (ref 3.5–5.2)
ALK PHOS: 66 U/L (ref 39–117)
ALT: 34 U/L (ref 0–53)
AST: 47 U/L — AB (ref 0–37)
BUN: 61 mg/dL — ABNORMAL HIGH (ref 6–23)
CHLORIDE: 87 meq/L — AB (ref 96–112)
CO2: 24 mEq/L (ref 19–32)
Calcium: 10.2 mg/dL (ref 8.4–10.5)
Creatinine, Ser: 7.02 mg/dL — ABNORMAL HIGH (ref 0.50–1.35)
GFR calc Af Amer: 9 mL/min — ABNORMAL LOW (ref >90)
GFR calc non Af Amer: 7 mL/min — ABNORMAL LOW (ref >90)
Glucose, Bld: 222 mg/dL — ABNORMAL HIGH (ref 70–99)
POTASSIUM: 3.9 meq/L (ref 3.7–5.3)
Sodium: 132 mEq/L — ABNORMAL LOW (ref 137–147)
Total Bilirubin: 0.5 mg/dL (ref 0.3–1.2)
Total Protein: 8.5 g/dL — ABNORMAL HIGH (ref 6.0–8.3)

## 2013-10-05 LAB — GLUCOSE, CAPILLARY
GLUCOSE-CAPILLARY: 153 mg/dL — AB (ref 70–99)
GLUCOSE-CAPILLARY: 171 mg/dL — AB (ref 70–99)
Glucose-Capillary: 194 mg/dL — ABNORMAL HIGH (ref 70–99)
Glucose-Capillary: 188 mg/dL — ABNORMAL HIGH (ref 70–99)
Glucose-Capillary: 199 mg/dL — ABNORMAL HIGH (ref 70–99)
Glucose-Capillary: 236 mg/dL — ABNORMAL HIGH (ref 70–99)
Glucose-Capillary: 145 mg/dL — ABNORMAL HIGH (ref 70–99)

## 2013-10-05 LAB — CLOSTRIDIUM DIFFICILE BY PCR: CDIFFPCR: NEGATIVE

## 2013-10-05 LAB — MAGNESIUM: Magnesium: 2.3 mg/dL (ref 1.5–2.5)

## 2013-10-05 MED ORDER — ALTEPLASE 2 MG IJ SOLR
2.0000 mg | Freq: Once | INTRAMUSCULAR | Status: DC | PRN
  Filled 2013-10-05: qty 2

## 2013-10-05 MED ORDER — HEPARIN SODIUM (PORCINE) 1000 UNIT/ML DIALYSIS
3500.0000 [IU] | INTRAMUSCULAR | Status: DC | PRN
  Administered 2013-10-05: 3500 [IU] via INTRAVENOUS_CENTRAL

## 2013-10-05 MED ORDER — HEPARIN SODIUM (PORCINE) 1000 UNIT/ML DIALYSIS: 3500.0000 [IU] | Freq: Once | INTRAMUSCULAR | Status: DC

## 2013-10-05 MED ORDER — NEPRO/CARBSTEADY PO LIQD
237.0000 mL | ORAL | Status: DC | PRN
  Filled 2013-10-05: qty 237

## 2013-10-05 MED ORDER — LIDOCAINE-PRILOCAINE 2.5-2.5 % EX CREA
1.0000 "application " | TOPICAL_CREAM | CUTANEOUS | Status: DC | PRN
  Filled 2013-10-05: qty 5

## 2013-10-05 MED ORDER — PENTAFLUOROPROP-TETRAFLUOROETH EX AERO: 1.0000 "application " | INHALATION_SPRAY | CUTANEOUS | Status: DC | PRN

## 2013-10-05 MED ORDER — SODIUM CHLORIDE 0.9 % IV SOLN: 100.0000 mL | INTRAVENOUS | Status: DC | PRN

## 2013-10-05 MED ORDER — LIDOCAINE HCL (PF) 1 % IJ SOLN: 5.0000 mL | INTRAMUSCULAR | Status: DC | PRN

## 2013-10-05 MED ORDER — ALBUMIN HUMAN 25 % IV SOLN
INTRAVENOUS | Status: AC
  Filled 2013-10-05: qty 100

## 2013-10-05 MED ORDER — HEPARIN SODIUM (PORCINE) 1000 UNIT/ML IJ SOLN: 3500.0000 [IU] | Freq: Once | INTRAMUSCULAR | Status: DC

## 2013-10-05 MED ORDER — ALBUMIN HUMAN 25 % IV SOLN
25.0000 g | Freq: Once | INTRAVENOUS | Status: AC
  Administered 2013-10-05: 25 g via INTRAVENOUS

## 2013-10-05 MED ORDER — HEPARIN SODIUM (PORCINE) 1000 UNIT/ML DIALYSIS: 1000.0000 [IU] | INTRAMUSCULAR | Status: DC | PRN

## 2013-10-05 NOTE — Evaluation (Addendum)
Physical Therapy Evaluation Patient Details Name: Oscar Castillo MRN: [id number] DOB: 20-Jan-1951 Today's Date: 10/05/2013 Time: 1324-4010 PT Time Calculation (min): 32 min  PT Assessment / Plan / Recommendation History of Present Illness  63 y.o. male admitted to [facility] on 10/01/13 with AMS and FTT.  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 Of note, he has history of L BKA as well. Pt with significant PMHx of diabetes and severe peripheral vascular disease who also has end-stage renal disease and undergoes dialysis.    Clinical Impression  Pt admitted with AMS. Thus far have been unable to find source of infection. Pt currently with functional limitations due to the deficits listed below (see PT Problem List). Lengthy discussion with wife and she does not want pt to return to SNF for therapy and wants to take him home. Discussed the obstacles, including getting pt from home to/from dialysis. She remains hopeful that pt will be able to participate in PT and return home. Pt will benefit from skilled PT to increase their independence and safety with mobility to allow discharge to the venue listed below.       PT Assessment  Patient needs continued PT services    Follow Up Recommendations  SNF;Supervision/Assistance - 24 hour (Wife does not want SNF, however this would be most approp)    Does the patient have the potential to tolerate intense rehabilitation      Barriers to Discharge Decreased caregiver support Wife very committed to caring for pt, however pt will have to make a great deal of progress     Equipment Recommendations  Other (comment) (TBD if pt d/c's home)    Recommendations for Other Services     Frequency Min 2X/week    Precautions / Restrictions Precautions Precautions: Fall Precaution Comments: pt also with poor vision, wife reporting he has to have cataract surgery Restrictions Other Position/Activity Restrictions: bil BKA   Pertinent  Vitals/Pain No signs of pain      Mobility  Bed Mobility Overal bed mobility: Needs Assistance Bed Mobility: Rolling Rolling: Max assist General bed mobility comments: roll to Lt with pt reaching with RUE for rail and once able to grasp it he did weakly try to pull himself onto his Lt side Transfers General transfer comment: Lengthy discussion with wife re: options for bil amputee transfers. She is very concerned with how she is going to get him to and from HD once she takes him home. Inquired about anterior-posterior type transfer, sliding board (and whether possible with his sacral decubitus), and even hoyer lift.     Exercises     PT Diagnosis: Generalized weakness  PT Problem List: Decreased strength;Decreased range of motion;Decreased activity tolerance;Decreased balance;Decreased mobility;Decreased knowledge of use of DME;Decreased skin integrity PT Treatment Interventions: DME instruction;Functional mobility training;Therapeutic activities;Therapeutic exercise;Balance training;Patient/family education     PT Goals(Current goals can be found in the care plan section) Acute Rehab PT Goals Patient Stated Goal: pt unable; wife wants him to go home PT Goal Formulation: With family Time For Goal Achievement: 10/19/13 Potential to Achieve Goals: Poor (due to FTT and lack of eating)  Visit Information  Last PT Received On: 10/05/13 Assistance Needed: +2 History of Present Illness: 63 y.o. male admitted to [facility] on 10/01/13 with AMS and FTT.  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 Of note, he has history of L BKA as well. Pt with significant PMHx of diabetes and severe peripheral  vascular disease who also has end-stage renal disease and undergoes dialysis.        Prior [place] expects to be discharged to:: Private residence Living Arrangements: Spouse/significant other Available Help at Discharge: Family Type of Home:  House Home Access: Level entry Home Layout: Two level (2 steps to get into the den) Alternate Level Stairs-Number of Steps: 2 Alternate Level Stairs-Rails: None Home Equipment: Walker - 2 wheels;Cane - single point;Bedside commode;Shower seat;Wheelchair - manual;Hospital bed (urinal) Additional Comments: Wife reports that she has his hospital bed set up in the den which is a level entry.  He cannot get up into the rest of the house due to two stairs to get there from the den.  Wife is unable/not stong eoungh to bump him up in his WC.   Prior Function Level of Independence: Needs assistance Gait / Transfers Assistance Needed: prior to most recent amputation, he was able to scoot or pivot into w/c with wife's assist. Since R BKA, has not participated well with PT at SNF Communication Communication: Other (comment) (low volume; slow to respond) Dominant Hand: Right    Cognition  Cognition Arousal/Alertness: Lethargic (post HD; wife reports it wears him out) Behavior During Therapy: Flat affect Overall Cognitive Status: Difficult to assess Difficult to assess due to: Level of arousal    Extremity/Trunk Assessment Upper Extremity Assessment Upper Extremity Assessment: Defer to OT evaluation Lower Extremity Assessment Lower Extremity Assessment: RLE deficits/detail;LLE deficits/detail RLE Deficits / Details: right knee extension lacking 15 degrees; hip flexion 3/5 LLE Deficits / Details: knee extension lacking 15 degrees; hip flexion 3/5   Balance Balance Sitting balance - Comments: unable to assess due to lethargy  General Comments General comments (skin integrity, edema, etc.): Family returned to room after meeting "with the doctor." Inquired whether they wished for PT to attempt evaluation and wife stated she did. She acknowledged pt was lethargic after HD, however wanted to see what he could do. Before the mobility assessment could be initiated, she was already asking questions related to  taking pt home and how she would be able to manage him (see transfer comments). Upon leaving the room, encountered the MD and he reported the family has agreed to Palliative Consult. Informed him of the questions wife was asking (including her goal for pt to ultimately get a prosthesis and her concern to maintain knee extension). Does not appear pt's wife understands severity of pt's illness.   End of Session PT - End of Session Activity Tolerance: Patient limited by fatigue Patient left: in bed;with call bell/phone within reach;with family/visitor present  GP     Ambers Iyengar 10/05/2013, 6:18 PM Pager [phone]

## 2013-10-05 NOTE — Procedures (Signed)
I was present at this dialysis session, have reviewed the session itself and made  appropriate changes  Kelly Splinter MD (pgr) [phone]    ([phone] 10/05/2013, 11:46 AM

## 2013-10-05 NOTE — Progress Notes (Signed)
[facility] PROGRESS NOTE  Oscar Castillo LZJ:[id number] DOB: Aug 12, 1950 DOA: 10/01/2013 PCP: [name], MD  Assessment/Plan:  Encephalopathy, metabolic  -much more alert today- answering questions easily- remains confused  -Suspect dehydration and possible underlying infectious processes as etiology  New LBBB  -has been transient in nature  -systolic function normal but septal motion with abnormal function and dyssynergy  -cardiac enzymes negative  -doubt pt appropriate for invasive cardiac work up  Dehydration /FTT (failure to thrive) in adult  -Continue IV fluid-type of fluid adjusted by nephrology today  -SLP attempted 10/02/2013 the patient unable to arouse enough to eval but later re examined and now on D1 diet  -BP up so decrease IVF to 50/hr- no UF with HD for now  -had anorexia acutely pre admit and concern over ? Constipation so will check KUB  Fever  -Unclear etiology- suspect DH and atelectasis  -question of right lower lobe right but currently is not hypoxic  -He did have leukocytosis so continue empiric antibiotics as precaution  -Patient unable to contribute to history and given altered mentation and fever an LP was done to rule out possible meningitis-CSF studies pending-partial results with 35 of protein and 156 of glucose so not indicative of bacterial meningitis-CSF culture no growth  -Influenza PCR negative  -Medication reconciliation list that patient was on doxycycline prior to admission which was started on 09/29/2013 or suspected postoperative wound infection/possible osteomyelitis and scheduled to go for 3 weeks  -Blood cultures no growth to date urinalysis unremarkable  -Recent surgical site infected?     HYPERTENSION  -Blood pressure controlled  -on IV Lopressor every 6 hours  -Was on Normodyne prior to admission- will resume  Anemia due to chronic illness  -Hemoglobin has trended down to 13 after hydration noting baseline just under 11  DM  (diabetes mellitus) type I controlled with renal manifestation  -On Lantus prior to admission-consider adding back a low dose Lantus  -Continue sliding scale insulin  -CBGs remained controlled   CKD (chronic kidney disease) stage V requiring chronic dialysis  -Usual dialysis days Tuesday Thursday Saturday  -No ultrafiltration secondary to ongoing dehydration   Chronic combined systolic (EF 02%) and grade 2 diastolic congestive heart failure  -Compensated and managed with dialysis  HEPATITIS C, HX OF  CEREBROVASCULAR ACCIDENT, HX OF  BENIGN PROSTATIC HYPERTROPHY  Hyperlipidemia    Code Status: Full Family Communication: Family conference today consisting of myself,Dr. Roney Jaffe Nephrology, wife, sister discussed plan of care Disposition Plan: We will await recommendations from palliative care   Consultants: Dr. Roney Jaffe Nephrology  Dr Meridee Score (Orthopedic Surgery) Radiology for lumbar puncture   Procedures: Lumbar puncture by interventional radiology   Antibiotics: Aztreonam 3/08 >>>  Vancomycin 3/08 >>>     HPI/Subjective: 63 year old male PMHx HTN, HLD, Hx diabetes type 1 controlled, vasculopath, S/P recent right BKA on 08/2013. He also has a prior history of CVA in 2009 and is on chronic hemodialysis. In addition he has combined systolic& diastolic heart failure with an EF of 45%. According to the patient's wife he had been doing well in the postoperative period until around the same time he followed up with Dr. Sharol Given. At that time he started with poor intake and gradually increasing altered mentation. 09/24/2013 he was very confused when not work with the therapist. On 09/26/2013 a meeting was called at the nursing facility because he was experiencing extreme symptoms of failure to thrive and they were concerned the patient was depressed. Palliative  care team had apparently been meeting with the family prior to admission. By 09/29/2013 he was a little more  coherent and began trying to eat but later in the morning he would not take his meds kept his mouth shut.  Postoperatively he has had 2 courses of antibiotics for concerns of possible stump infection. In regards to dietary intake: in addition to the above the patient has been on a dysphagia diet with pured foods begun after the patient was noted to be coughing with attempts to eat.  Upon presentation to the emergency department EMS reported that the wife found food in the patient's mouth and he was unresponsive. His CBG was 379. He was hypoxemic and placed on a nonrebreather mask. His hemoglobin at presentation was 16.7 which was markedly elevated compared to prior readings between 9.9 and 11.5. He was also noted to have significant decreased breath sounds on the right without wheezing or crackles. The right BKA stump was unremarkable in appearance. Was also found to be mildly febrile with a rectal temperature of 101. Chest x-ray was concerning for possible right lower lobe pneumonia. 3/12 patient just returned from HD somewhat lethargic, complained of right BKA stump pain which has been increasing over several days   Objective: Filed Vitals:   10/04/13 1800 10/04/13 1900 10/04/13 2115 10/05/13 0432  BP: 142/94 143/88 123/85 102/66  Pulse: 86 89 90 86  Temp:   99.4 F (37.4 C) 99 F (37.2 C)  TempSrc:   Oral Oral  Resp: 16 16 16 16   Height:   4\' 8"  (1.422 m)   Weight:   74 kg (163 lb 2.3 oz)   SpO2: 100% 100% 98% 96%    Intake/Output Summary (Last 24 hours) at 10/05/13 0705 Last data filed at 10/04/13 1900  Gross per 24 hour  Intake   1560 ml  Output      1 ml  Net   1559 ml   Filed Weights   10/03/13 9604 10/03/13 1212 10/04/13 2115  Weight: 78 kg (171 lb 15.3 oz) 78.2 kg (172 lb 6.4 oz) 74 kg (163 lb 2.3 oz)    Exam:   General:  Lethargic, but arousable will answer some questions especially when family present  Cardiovascular: Regular rhythm and rate, negative murmurs rubs  gallop  Respiratory: Clear to auscultation bilateral  Abdomen: Soft, nontender, nondistended plus bowel sound  Musculoskeletal: Bilateral BKA, left BKA performed November 2014 still remains sore to palpation; right BKA performed February 2015 extremely tender to palpation, unable to fully evaluate with dressing stuck to surgical site, in the a.m. will form bedside dressing changes/cleaning.   Data Reviewed: Basic Metabolic Panel:  Recent Labs Lab 10/01/13 1135 10/01/13 1136 10/02/13 0255 10/03/13 0304 10/04/13 0429  NA 141 142 144 145 135*  K 4.9 4.7 4.5 4.9 4.2  CL 92* 98 95* 95* 88*  CO2 28  --  24 25 28   GLUCOSE 356* 369* 178* 160* 174*  BUN 49* 56* 61* 85* 44*  CREATININE 6.33* 7.40* 7.58* 9.88* 5.80*  CALCIUM 11.2*  --  11.1* 11.3* 10.5  PHOS  --   --   --   --  5.1*   Liver Function Tests:  Recent Labs Lab 10/01/13 1135 10/02/13 0255 10/04/13 0429  AST 35 30  --   ALT 13 12  --   ALKPHOS 75 79  --   BILITOT 0.5 0.5  --   PROT 10.4* 9.5*  --   ALBUMIN 3.4* 3.1* 3.1*  No results found for this basename: LIPASE, AMYLASE,  in the last 168 hours  Recent Labs Lab 10/01/13 1201  AMMONIA 29   CBC:  Recent Labs Lab 10/01/13 1135 10/01/13 1136 10/02/13 1112 10/03/13 0304 10/04/13 0429  WBC 12.6*  --  15.0* 12.5* 11.0*  NEUTROABS 9.2*  --   --   --   --   HGB 13.7 16.7 13.6 12.7* 12.5*  HCT 41.3 49.0 40.2 37.9* 37.5*  MCV 86.8  --  85.4 84.4 84.7  PLT 262  --  258 306 308   Cardiac Enzymes:  Recent Labs Lab 10/01/13 1135 10/02/13 1630 10/02/13 2151 10/03/13 0304  CKTOTAL  --  351* 339* 345*  CKMB  --  2.2 2.5 2.9  TROPONINI <0.30 <0.30 <0.30 <0.30   BNP (last 3 results)  Recent Labs  10/01/13 1135  PROBNP 1397.0*   CBG:  Recent Labs Lab 10/04/13 1155 10/04/13 1621 10/04/13 1934 10/04/13 2113 10/05/13 0037  GLUCAP 199* 195* 194* 188* 171*    Recent Results (from the past 240 hour(s))  CULTURE, BLOOD (ROUTINE X 2)     Status:  None   Collection Time    10/01/13 11:35 AM      Result Value Ref Range Status   Specimen Description BLOOD RIGHT WRIST   Final   Special Requests BOTTLES DRAWN AEROBIC ONLY 10CC   Final   Culture  Setup Time     Final   Value: 10/01/2013 19:00     Performed at [facility]   Culture     Final   Value:        BLOOD CULTURE RECEIVED NO GROWTH TO DATE CULTURE WILL BE HELD FOR 5 DAYS BEFORE ISSUING A FINAL NEGATIVE REPORT     Performed at [facility]   Report Status PENDING   Incomplete  CULTURE, BLOOD (ROUTINE X 2)     Status: None   Collection Time    10/01/13 12:01 PM      Result Value Ref Range Status   Specimen Description BLOOD RIGHT FOREARM   Final   Special Requests BOTTLES DRAWN AEROBIC AND ANAEROBIC 5CC   Final   Culture  Setup Time     Final   Value: 10/01/2013 18:59     Performed at [facility]   Culture     Final   Value:        BLOOD CULTURE RECEIVED NO GROWTH TO DATE CULTURE WILL BE HELD FOR 5 DAYS BEFORE ISSUING A FINAL NEGATIVE REPORT     Performed at [facility]   Report Status PENDING   Incomplete  CSF CULTURE     Status: None   Collection Time    10/02/13 10:55 AM      Result Value Ref Range Status   Specimen Description CSF   Final   Special Requests Normal   Final   Gram Stain     Final   Value: FEW WBC PRESENT, PREDOMINANTLY MONONUCLEAR     NO ORGANISMS SEEN     Performed at [facility]   Culture     Final   Value: NO GROWTH 1 DAY     Performed at [facility]   Report Status PENDING   Incomplete  GRAM STAIN     Status: None   Collection Time    10/02/13 10:55 AM      Result Value Ref Range Status   Specimen Description CSF   Final  Special Requests Normal   Final   Gram Stain     Final   Value: CYTOSPIN PREP     WBC PRESENT, PREDOMINANTLY MONONUCLEAR     NO ORGANISMS SEEN   Report Status 10/02/2013 FINAL   Final  MRSA PCR SCREENING     Status: None   Collection Time    10/02/13 11:18 PM       Result Value Ref Range Status   MRSA by PCR NEGATIVE  NEGATIVE Final   Comment:            The GeneXpert MRSA Assay (FDA     approved for NASAL specimens     only), is one component of a     comprehensive MRSA colonization     surveillance program. It is not     intended to diagnose MRSA     infection nor to guide or     monitor treatment for     MRSA infections.     Studies: Dg Abd Portable 1v  10/04/2013   CLINICAL DATA Constipation  EXAM PORTABLE ABDOMEN - 1 VIEW  COMPARISON 11/01/2012  FINDINGS Scattered large and small bowel gas is noted. No free air is seen. No abnormal mass or abnormal calcifications are noted. Mild degenerative changes of the lumbar spine are seen.  IMPRESSION No acute abnormality noted.  SIGNATURE  Electronically Signed   By: Inez Catalina M.D.   On: 10/04/2013 11:03    Scheduled Meds: . aztreonam  1 g Intravenous Q24H  . collagenase   Topical Daily  . feeding supplement (NEPRO CARB STEADY)  237 mL Oral BID BM  . heparin  5,000 Units Subcutaneous 3 times per day  . insulin aspart  0-9 Units Subcutaneous 6 times per day  . labetalol  300 mg Oral BID  . neomycin-bacitracin-polymyxin   Topical TID  . sodium chloride  3 mL Intravenous Q12H  . vancomycin  750 mg Intravenous Q T,Th,Sa-HD   Continuous Infusions: . dextrose 5 % and 0.2 % NaCl 50 mL/hr at 10/04/13 1116    Active Problems:   HYPERTENSION   HEPATITIS C, HX OF   CEREBROVASCULAR ACCIDENT, HX OF   BENIGN PROSTATIC HYPERTROPHY   Hyperlipidemia   Anemia due to chronic illness   DM (diabetes mellitus) type I controlled with renal manifestation   Chronic combined systolic (EF [id number]) and grade 2diastolic congestive heart failure   FTT (failure to thrive) in adult   Encephalopathy, metabolic   Dehydration   Fever    Time spent: 90 minutes    WOODS, CURTIS, J  [facility] Pager [phone]. If 7PM-7AM, please contact night-coverage at [email or web address], password [facility] 10/05/2013, 7:05 AM   LOS: 4 days

## 2013-10-05 NOTE — Progress Notes (Signed)
Occupational Therapy Cancellation Note  Pt currently is in HD.  Will reattempt later today as schedule allows, or will initiate eval tomorrow.   Lucille Passy, OTR/L [phone]

## 2013-10-05 NOTE — Progress Notes (Signed)
Patient ID: Oscar Castillo, male   DOB: 1951-05-27, 63 y.o.   MRN: [id number]     [facility]  No Known Allergies   Chief Complaint  Patient presents with  . Acute Visit    stump incision line     HPI:  Nursing staff is concerned about his right stump incision line in regards to infection present. The wound has dehiscence present. There is a small amount of drainage present and foul odor present. He is unable to participate in the hpi or ros.   Past Medical History  Diagnosis Date  . ESRD on hemodialysis 05/05/2007    ESRD due to DM/HTN. Started dialysis in November 2013.  HD TTS at [facility] [address].  [name] BACK PAIN, LUMBAR, CHRONIC 08/06/2009  . BENIGN PROSTATIC HYPERTROPHY 08/01/2010  . CEREBROVASCULAR ACCIDENT, HX OF 08/06/2009  . CHOLELITHIASIS 08/01/2010  . CONGESTIVE HEART FAILURE 03/18/2009  . DEPRESSION 03/18/2009  . DIABETES MELLITUS, TYPE II 03/25/2007  . ERECTILE DYSFUNCTION 03/25/2007  . GERD 03/25/2007  . HEPATITIS C, HX OF 03/25/2007  . HYPERTENSION 03/25/2007  . Morbid obesity 03/25/2007  . NEPHROLITHIASIS, HX OF 03/18/2009  . Complication of anesthesia     wife states pt had trouble waking up with his last surgery in Nov., 2014    Past Surgical History  Procedure Laterality Date  . Nephrectomy      partial RR  . Av fistula placement  06/14/2012    Procedure: ARTERIOVENOUS (AV) FISTULA CREATION;  Surgeon: Angelia Mould, MD;  Location: [facility] OR;  Service: Vascular;  Laterality: Left;  Left basilic vein transposition with fistula.  . Tibia im nail insertion Left 09/09/2012    Procedure: INTRAMEDULLARY (IM) NAIL TIBIAL;  Surgeon: Johnny Bridge, MD;  Location: [place];  Service: Orthopedics;  Laterality: Left;  left tibial nail and open reduction internal fixation left fibula fracture  . Orif fibula fracture Left 09/09/2012    Procedure: OPEN REDUCTION INTERNAL FIXATION (ORIF) FIBULA FRACTURE;  Surgeon: Johnny Bridge, MD;  Location: [place];  Service: Orthopedics;   Laterality: Left;  . Colonoscopy N/A 10/28/2012    Procedure: COLONOSCOPY;  Surgeon: Jeryl Columbia, MD;  Location[facility] ENDOSCOPY;  Service: Endoscopy;  Laterality: N/A;  . Esophagogastroduodenoscopy N/A 11/02/2012    Procedure: ESOPHAGOGASTRODUODENOSCOPY (EGD);  Surgeon: Cleotis Nipper, MD;  Location: [facility] ENDOSCOPY;  Service: Endoscopy;  Laterality: N/A;  . Colonoscopy N/A 11/02/2012    Procedure: COLONOSCOPY;  Surgeon: Cleotis Nipper, MD;  Location: [facility] ENDOSCOPY;  Service: Endoscopy;  Laterality: N/A;  . Colonoscopy N/A 11/03/2012    Procedure: COLONOSCOPY;  Surgeon: Cleotis Nipper, MD;  Location: [facility] ENDOSCOPY;  Service: Endoscopy;  Laterality: N/A;  . Givens capsule study N/A 11/04/2012    Procedure: GIVENS CAPSULE STUDY;  Surgeon: Cleotis Nipper, MD;  Location: [facility] ENDOSCOPY;  Service: Endoscopy;  Laterality: N/A;  . Enteroscopy N/A 11/08/2012    Procedure: ENTEROSCOPY;  Surgeon: Wonda Horner, MD;  Location: [facility] ENDOSCOPY;  Service: Endoscopy;  Laterality: N/A;  . Amputation Left 05/12/2013    Procedure: AMPUTATION RAY;  Surgeon: Newt Minion, MD;  Location: [place];  Service: Orthopedics;  Laterality: Left;  Left Foot 1st Ray Amputation  . Eye surgery Left     to remove scar tissue  . Amputation Left 06/09/2013    Procedure: AMPUTATION BELOW KNEE;  Surgeon: Newt Minion, MD;  Location: [place];  Service: Orthopedics;  Laterality: Left;  Left Below Knee Amputation  and removal proximal screws IM tibial nail  . Hardware removal Left 06/09/2013    Procedure: HARDWARE REMOVAL;  Surgeon: Newt Minion, MD;  Location: [place];  Service: Orthopedics;  Laterality: Left;  Left Below Knee Amputation  and Removal proximal screws IM tibial nail  . Amputation Right 09/08/2013    Procedure: AMPUTATION BELOW KNEE;  Surgeon: Newt Minion, MD;  Location: [place];  Service: Orthopedics;  Laterality: Right;  Right Below Knee Amputation    VITAL SIGNS BP 115/54  Pulse 67  Ht 4\' 8"  (1.422 m)  Wt 166 lb (75.297 kg)   BMI 37.24 kg/m2   Patient's Medications  New Prescriptions   No medications on file  Previous Medications   ACETAMINOPHEN (TYLENOL) 500 MG TABLET    Take 500 mg by mouth every 6 (six) hours as needed for mild pain.   AMLODIPINE (NORVASC) 10 MG TABLET    Take 10 mg by mouth daily.   ASCORBIC ACID (VITAMIN C PO)    Take 1 tablet by mouth daily.   BRIMONIDINE (ALPHAGAN) 0.2 % OPHTHALMIC SOLUTION    Place 1 drop into both eyes 2 (two) times daily.   BRINZOLAMIDE-BRIMONIDINE (SIMBRINZA) 1-0.2 % SUSP    Place 1 drop into both eyes 2 (two) times daily.    CALCIUM ACETATE (PHOSLO) 667 MG CAPSULE    Take 2 capsules (1,334 mg total) by mouth 3 (three) times daily with meals.   DOCUSATE SODIUM (COLACE) 100 MG CAPSULE    Take 1 capsule (100 mg total) by mouth 2 (two) times daily.   DOXYCYCLINE (ADOXA) 100 MG TABLET    Take 100 mg by mouth 2 (two) times daily. Started on 09/29/13 X 3 weeks   GABAPENTIN (NEURONTIN) 300 MG CAPSULE    Take 300 mg by mouth daily as needed (for mild to moderate pain).    HALOPERIDOL (HALDOL) 5 MG TABLET    Take 5 mg by mouth at bedtime as needed for agitation.   HYDROCODONE-ACETAMINOPHEN (NORCO) 7.5-325 MG PER TABLET    Take 1 tablet by mouth every 6 (six) hours as needed for moderate pain.   INSULIN GLARGINE (LANTUS SOLOSTAR [place])    Inject 5 Units into the skin every evening.   LABETALOL (NORMODYNE) 300 MG TABLET    Take 1 tablet (300 mg total) by mouth 3 (three) times daily.   SACCHAROMYCES BOULARDII (FLORASTOR) 250 MG CAPSULE    Take 250 mg by mouth 2 (two) times daily.   SERTRALINE (ZOLOFT) 100 MG TABLET    Take 100 mg by mouth daily as needed. For depression   SILVER SULFADIAZINE (SILVADENE) 1 % CREAM    Apply 1 application topically daily.   TRAVOPROST, BAK FREE, (TRAVATAN) 0.004 % SOLN OPHTHALMIC SOLUTION    Place 1 drop into both eyes at bedtime.   TRAZODONE (DESYREL) 50 MG TABLET    Take 50 mg by mouth at bedtime as needed for sleep.  Modified Medications   No  medications on file  Discontinued Medications   No medications on file    SIGNIFICANT DIAGNOSTIC EXAMS  NONE DURING THIS HOSPITALIZATION   LABS REVIEWED:  09-08-13: wbc 9.4; hgb 12.3; hct 36.6; mcv 84.3; plt 319; glucose 181; bun 28; creat 8.2; k+4.2; na++141; liver normal albumin 3.3 09-26-13: wbc 9.1; hgb 12.3; hct 40.1; mcv 87.2; plt 407; glucose 162; bun 29; creat 7.0; k+4.1; na++137; total protein 8.7; albumin 3.9; folate 10.34; vit b12: 647; tsh 0.597      Review of  Systems  Unable to perform    Physical Exam  Constitutional: He is oriented to person, place, and time. He appears well-developed and well-nourished. No distress.  Neck: Neck supple. No JVD present.  Cardiovascular: Normal rate and regular rhythm.   Respiratory: Effort normal and breath sounds normal. No respiratory distress. He has no wheezes.  GI: Soft. Bowel sounds are normal. He exhibits no distension. There is no tenderness.  Musculoskeletal:  Is able to move extremities  Bilateral bka  Neurological: he is lethargic; knows name; is not oriented to surroundings.  Skin: Skin is warm and dry. He is not diaphoretic.  Sacral wound: with slough present no signs of infection present.  Left upper arm a/v shunt: +thrill + bruit right stump dehiscences with slough tissue present there is inflammation present and foul odor present as well.   ASSESSMENT/ PLAN:  1. Right stump dehiscences and cellulitis: will begin doxycycline 100 mg twice daily for 3 weeks with florastor twice daily for 3 weeks; will inform dialysis of med change and will have patient to see Dr. Sharol Given asap. Will continue to monitor his status.        Ok Edwards NP [facility]  Contact [phone] Monday through Friday 8am- 5pm  After hours call [phone]

## 2013-10-05 NOTE — Progress Notes (Signed)
[facility] Progress Note   Subjective: opens eyes, attempts to speak, confused  Filed Vitals:   10/05/13 0432 10/05/13 0815 10/05/13 1039 10/05/13 1100  BP: 102/66 100/49 87/63 116/71  Pulse: 86 80 76 55  Temp: 99 F (37.2 C) 98.3 F (36.8 C) 98 F (36.7 C)   TempSrc: Oral Oral Oral   Resp: 16 16 16 15   Height:      Weight:   76 kg (167 lb 8.8 oz)   SpO2: 96% 96% 95%    Exam: Opens eyes to voice, does not verbalize, falls back asleep No jvd  Chest clear bilat  RRR no MRG  ABd soft, scaphoid, no ascites or tenderness , ND  Bilat LE BKA, no edema LUA fistula patent  CXR negative ECHO 10/01/13 LV and RV normal fxn  Head CT negative   [facility]  4h 83min 72.5kg 2/2.0 [place] LUA AVF Heparin 5000 / 2400  Hectorol 1 Epo none Venofer none   Assessment:  1 Altered mental status- some improvement, still confused 2 ESRD on hemodialysis  3 Fever- on vanc + aztreonam IV, blood cx's neg, temp/wbc declining 4 PVD / recent R BKA 2/13, prior L BKA 5 DM on insulin  6 Vol depletion- resolved 7 2HPT cont phoslo, hold vit D (^Ca)  8 Hep C  9 Hx CVA 10 Anemia Hb 12.5, no esa  11 FTT / debility- came from SNF  Plan-  HD today, stopped IVF"s. Spoke with pt's wife about pt's condition briefly on the phone, plan to meet with her tomorrow at [facility] Sat MD  pager [phone]    cell [phone]  10/05/2013, 11:36 AM     Recent Labs Lab 10/03/13 0304 10/04/13 0429 10/05/13 0749  NA 145 135* 132*  K 4.9 4.2 3.9  CL 95* 88* 87*  CO2 25 28 24   GLUCOSE 160* 174* 222*  BUN 85* 44* 61*  CREATININE 9.88* 5.80* 7.02*  CALCIUM 11.3* 10.5 10.2  PHOS  --  5.1*  --     Recent Labs Lab 10/01/13 1135 10/02/13 0255 10/04/13 0429 10/05/13 0749  AST 35 30  --  47*  ALT 13 12  --  34  ALKPHOS 75 79  --  66  BILITOT 0.5 0.5  --  0.5  PROT 10.4* 9.5*  --  8.5*  ALBUMIN 3.4* 3.1* 3.1* 2.7*    Recent Labs Lab 10/01/13 1135  10/03/13 0304  10/04/13 0429 10/05/13 0749  WBC 12.6*  < > 12.5* 11.0* 8.8  NEUTROABS 9.2*  --   --   --  5.8  HGB 13.7  < > 12.7* 12.5* 12.6*  HCT 41.3  < > 37.9* 37.5* 38.2*  MCV 86.8  < > 84.4 84.7 82.9  PLT 262  < > 306 308 310  < > = values in this interval not displayed. [name] albumin human      . aztreonam  1 g Intravenous Q24H  . collagenase   Topical Daily  . feeding supplement (NEPRO CARB STEADY)  237 mL Oral BID BM  . heparin  3,500 Units Dialysis Once in dialysis  . heparin  5,000 Units Subcutaneous 3 times per day  . insulin aspart  0-9 Units Subcutaneous 6 times per day  . labetalol  300 mg Oral BID  . neomycin-bacitracin-polymyxin   Topical TID  . sodium chloride  3 mL Intravenous Q12H  . vancomycin  750 mg Intravenous Q  T,Th,Sa-HD     sodium chloride, sodium chloride, acetaminophen, alteplase, feeding supplement (NEPRO CARB STEADY), heparin, [START ON 10/06/2013] heparin, lidocaine (PF), lidocaine-prilocaine, oxyCODONE, pentafluoroprop-tetrafluoroeth

## 2013-10-05 NOTE — Progress Notes (Signed)
No SLP treatment today, pt in dialysis.  Herbie Baltimore, [place] CCC-SLP [phone]

## 2013-10-05 NOTE — Progress Notes (Signed)
PT Cancellation Note  Patient Details Name: Oscar Castillo MRN: [id number] DOB: Dec 03, 1950   Cancelled Treatment:    Reason Eval/Treat Not Completed: Patient at procedure or test/unavailable. Pt in hemodialysis. Will attempt later today.   Lener Ventresca 10/05/2013, 11:19 AM Pager [phone]

## 2013-10-06 ENCOUNTER — Inpatient Hospital Stay (HOSPITAL_COMMUNITY): Payer: Medicare Other

## 2013-10-06 DIAGNOSIS — I517 Cardiomegaly: Secondary | ICD-10-CM

## 2013-10-06 DIAGNOSIS — E785 Hyperlipidemia, unspecified: Secondary | ICD-10-CM

## 2013-10-06 DIAGNOSIS — I5042 Chronic combined systolic (congestive) and diastolic (congestive) heart failure: Secondary | ICD-10-CM

## 2013-10-06 DIAGNOSIS — I6350 Cerebral infarction due to unspecified occlusion or stenosis of unspecified cerebral artery: Secondary | ICD-10-CM

## 2013-10-06 DIAGNOSIS — I509 Heart failure, unspecified: Secondary | ICD-10-CM

## 2013-10-06 LAB — COMPREHENSIVE METABOLIC PANEL
ALT: 25 U/L (ref 0–53)
AST: 32 U/L (ref 0–37)
Albumin: 2.9 g/dL — ABNORMAL LOW (ref 3.5–5.2)
Alkaline Phosphatase: 60 U/L (ref 39–117)
BILIRUBIN TOTAL: 0.4 mg/dL (ref 0.3–1.2)
BUN: 29 mg/dL — AB (ref 6–23)
CHLORIDE: 95 meq/L — AB (ref 96–112)
CO2: 27 mEq/L (ref 19–32)
Calcium: 9.9 mg/dL (ref 8.4–10.5)
Creatinine, Ser: 4.36 mg/dL — ABNORMAL HIGH (ref 0.50–1.35)
GFR calc Af Amer: 15 mL/min — ABNORMAL LOW (ref >90)
GFR calc non Af Amer: 13 mL/min — ABNORMAL LOW (ref >90)
GLUCOSE: 174 mg/dL — AB (ref 70–99)
Potassium: 4.4 mEq/L (ref 3.7–5.3)
SODIUM: 139 meq/L (ref 137–147)
Total Protein: 8.1 g/dL (ref 6.0–8.3)

## 2013-10-06 LAB — MAGNESIUM: MAGNESIUM: 2.1 mg/dL (ref 1.5–2.5)

## 2013-10-06 LAB — CBC WITH DIFFERENTIAL/PLATELET
BASOS ABS: 0 10*3/uL (ref 0.0–0.1)
BASOS PCT: 0 % (ref 0–1)
EOS ABS: 0.1 10*3/uL (ref 0.0–0.7)
EOS PCT: 2 % (ref 0–5)
HCT: 34.7 % — ABNORMAL LOW (ref 39.0–52.0)
Hemoglobin: 11.3 g/dL — ABNORMAL LOW (ref 13.0–17.0)
LYMPHS PCT: 23 % (ref 12–46)
Lymphs Abs: 1.9 10*3/uL (ref 0.7–4.0)
MCH: 27.4 pg (ref 26.0–34.0)
MCHC: 32.6 g/dL (ref 30.0–36.0)
MCV: 84.2 fL (ref 78.0–100.0)
MONO ABS: 1 10*3/uL (ref 0.1–1.0)
Monocytes Relative: 11 % (ref 3–12)
Neutro Abs: 5.5 10*3/uL (ref 1.7–7.7)
Neutrophils Relative %: 65 % (ref 43–77)
PLATELETS: 310 10*3/uL (ref 150–400)
RBC: 4.12 MIL/uL — ABNORMAL LOW (ref 4.22–5.81)
RDW: 18.3 % — AB (ref 11.5–15.5)
WBC: 8.5 10*3/uL (ref 4.0–10.5)

## 2013-10-06 LAB — CSF CULTURE W GRAM STAIN: Culture: NO GROWTH

## 2013-10-06 LAB — GLUCOSE, CAPILLARY
GLUCOSE-CAPILLARY: 213 mg/dL — AB (ref 70–99)
Glucose-Capillary: 158 mg/dL — ABNORMAL HIGH (ref 70–99)
Glucose-Capillary: 120 mg/dL — ABNORMAL HIGH (ref 70–99)
Glucose-Capillary: 200 mg/dL — ABNORMAL HIGH (ref 70–99)

## 2013-10-06 LAB — CSF CULTURE: Special Requests: NORMAL

## 2013-10-06 MED ORDER — LORAZEPAM 2 MG/ML IJ SOLN
INTRAMUSCULAR | Status: AC
  Administered 2013-10-06: 2 mg via INTRAVENOUS
  Filled 2013-10-06: qty 1

## 2013-10-06 MED ORDER — DEXTROSE-NACL 5-0.9 % IV SOLN
INTRAVENOUS | Status: DC
  Administered 2013-10-06 – 2013-10-07 (×2): via INTRAVENOUS
  Administered 2013-10-08: 20 mL via INTRAVENOUS
  Administered 2013-10-08 – 2013-10-24 (×5): via INTRAVENOUS

## 2013-10-06 MED ORDER — INSULIN ASPART 100 UNIT/ML ~~LOC~~ SOLN
0.0000 [IU] | SUBCUTANEOUS | Status: DC
  Administered 2013-10-07 (×2): 3 [IU] via SUBCUTANEOUS
  Administered 2013-10-07: 4 [IU] via SUBCUTANEOUS
  Administered 2013-10-07: 3 [IU] via SUBCUTANEOUS
  Administered 2013-10-07: 4 [IU] via SUBCUTANEOUS
  Administered 2013-10-08 (×2): 3 [IU] via SUBCUTANEOUS
  Administered 2013-10-09: 4 [IU] via SUBCUTANEOUS
  Administered 2013-10-09: 3 [IU] via SUBCUTANEOUS
  Administered 2013-10-09: 4 [IU] via SUBCUTANEOUS
  Administered 2013-10-10 (×2): 3 [IU] via SUBCUTANEOUS
  Administered 2013-10-10: 4 [IU] via SUBCUTANEOUS
  Administered 2013-10-11: 3 [IU] via SUBCUTANEOUS
  Administered 2013-10-12: 7 [IU] via SUBCUTANEOUS
  Administered 2013-10-13: 1 [IU] via SUBCUTANEOUS
  Administered 2013-10-13: 3 [IU] via SUBCUTANEOUS
  Administered 2013-10-13: 4 [IU] via SUBCUTANEOUS
  Administered 2013-10-15 (×2): 3 [IU] via SUBCUTANEOUS
  Administered 2013-10-16: 4 [IU] via SUBCUTANEOUS
  Administered 2013-10-16 (×3): 3 [IU] via SUBCUTANEOUS
  Administered 2013-10-16: via SUBCUTANEOUS
  Administered 2013-10-16 – 2013-10-17 (×2): 3 [IU] via SUBCUTANEOUS
  Administered 2013-10-17 – 2013-10-18 (×4): 4 [IU] via SUBCUTANEOUS
  Administered 2013-10-18: 3 [IU] via SUBCUTANEOUS
  Administered 2013-10-18: 4 [IU] via SUBCUTANEOUS
  Administered 2013-10-18 – 2013-10-19 (×2): 3 [IU] via SUBCUTANEOUS
  Administered 2013-10-19: 4 [IU] via SUBCUTANEOUS
  Administered 2013-10-20: 18:00:00 via SUBCUTANEOUS
  Administered 2013-10-20 (×5): 4 [IU] via SUBCUTANEOUS
  Administered 2013-10-21 (×2): 3 [IU] via SUBCUTANEOUS
  Administered 2013-10-21: 4 [IU] via SUBCUTANEOUS
  Administered 2013-10-22: 7 [IU] via SUBCUTANEOUS
  Administered 2013-10-22 – 2013-10-25 (×14): 4 [IU] via SUBCUTANEOUS
  Administered 2013-10-25: 7 [IU] via SUBCUTANEOUS
  Administered 2013-10-26: 4 [IU] via SUBCUTANEOUS
  Administered 2013-10-26: 3 [IU] via SUBCUTANEOUS
  Administered 2013-10-27 (×3): 4 [IU] via SUBCUTANEOUS
  Administered 2013-10-27 (×2): 3 [IU] via SUBCUTANEOUS
  Administered 2013-10-28: 4 [IU] via SUBCUTANEOUS
  Administered 2013-10-28 (×2): 3 [IU] via SUBCUTANEOUS
  Administered 2013-10-29: 4 [IU] via SUBCUTANEOUS
  Administered 2013-10-29 – 2013-10-30 (×3): 3 [IU] via SUBCUTANEOUS
  Administered 2013-10-30 (×2): 4 [IU] via SUBCUTANEOUS
  Administered 2013-10-31 (×2): 3 [IU] via SUBCUTANEOUS

## 2013-10-06 MED ORDER — LORAZEPAM 2 MG/ML IJ SOLN
2.0000 mg | Freq: Once | INTRAMUSCULAR | Status: AC
  Administered 2013-10-06: 2 mg via INTRAVENOUS

## 2013-10-06 NOTE — Progress Notes (Signed)
OT Cancellation Note  Patient Details Name: Oscar Castillo MRN: [id number] DOB: 29-Jul-1950   Cancelled Treatment:    Reason Eval/Treat Not Completed: Fatigue/lethargy limiting ability to participate. Pt sleeping heavily, wife/multiple family members present  [name] 10/06/2013, 2:45 PM

## 2013-10-06 NOTE — Progress Notes (Signed)
Subjective:Speech Therapy in room attempting food/ tolerated hd yesterday Objective Vital signs in last 24 hours: Filed Vitals:   10/05/13 2045 10/06/13 0512 10/06/13 0620 10/06/13 0736  BP: 132/79 143/78  151/83  Pulse: 91 84  85  Temp: 98.8 F (37.1 C) 98.6 F (37 C) 98.9 F (37.2 C) 98.9 F (37.2 C)  TempSrc: Axillary Oral Oral Oral  Resp: '16 16  18  ' Height:      Weight: 68.3 kg (150 lb 9.2 oz)     SpO2: 99% 99%  99%  Exam:  Opens eyes to voice and very minimal voice response  No jvd  Chest minimal respiratory effort and CTA   bilat  RRR no MRG  ABd BS POS , soft, Non tender  and non distended  scaphoid, no ascites  Bilat LE BKAS,R bka ace wrap not removed /Left stump old surg scabs no edema  LUA fistula  Pos bruit   CXR negative  ECHO 10/01/13 LV and RV normal fxn  Head CT negative   [facility]  4h 17mn 72.5kg 2/2.0 [place] LUA AVF Heparin 5000 / 2400  Hectorol 1 Epo none Venofer none   Assessment:  1 Altered mental status- some improvement, still confused=  not able to communicate his location / date to me this am  2 ESRD on hemodialysis on schedule  3 Fever- on vanc + aztreonam IV, blood cx's neg, temp afeb 48 hrs /wbc now wnl  4 PVD / recent R BKA 2/13, prior L BKA  5 DM on insulin  6 Vol depletion- resolved / beow edw  Wt 68.3kg 10/05/13 7 2HPT cont phoslo, hold vit D (^Ca)  8 Hep C  9 Hx CVA  10 Anemia Hb 12.5, no esa  11 FTT / debility- came from SNF  Plan- HD in am keep even, stopped IVF"s  DErnest Haber PA-C [id number]/13/2015,8:24 AM  LOS: 5 days   Pt seen, examined, agree w assess/plan as above with additions as indicated. Pt continues to do poorly.  Will order MRI R leg as nothing else has come up as cause for infection.  Dr WSherral Hammersand I met with pt's wife, sister and brother-in-law yesterday. Discussed patient's multiple medical problems and recent decline.  Asked that they meet with palliative care team to  further discuss prognosis and goals of care and they agreed.  RKelly SplinterMD pager [phone]   cell [phone]/13/2015, 12:14 PM     L abs: Basic Metabolic Panel:  Recent Labs Lab 10/04/13 0429 10/05/13 0749 10/06/13 0620  NA 135* 132* 139  K 4.2 3.9 4.4  CL 88* 87* 95*  CO2 '28 24 27  ' GLUCOSE 174* 222* 174*  BUN 44* 61* 29*  CREATININE 5.80* 7.02* 4.36*  CALCIUM 10.5 10.2 9.9  PHOS 5.1*  --   --    Liver Function Tests:  Recent Labs Lab 10/02/13 0255 10/04/13 0429 10/05/13 0749 10/06/13 0620  AST 30  --  47* 32  ALT 12  --  34 25  ALKPHOS 79  --  66 60  BILITOT 0.5  --  0.5 0.4  PROT 9.5*  --  8.5* 8.1  ALBUMIN 3.1* 3.1* 2.7* 2.9*    Recent Labs Lab 10/01/13 1201  AMMONIA 29   CBC:  Recent Labs Lab 10/01/13 1135 10/02/13 1630 10/02/13 2151 10/03/13 0304  CKTOTAL  --  351* 339* 345*  CKMB  --  2.2 2.5 2.9  TROPONINI <0.30 <  0.30 <0.30 <0.30   CBG:  Recent Labs Lab 10/05/13 0037 10/05/13 0425 10/05/13 0744 10/05/13 1710 10/05/13 2114  GLUCAP 171* 199* 236* 145* 153*    Studies/Results: Dg Abd Portable 1v  10/04/2013   CLINICAL DATA Constipation  EXAM PORTABLE ABDOMEN - 1 VIEW  COMPARISON 11/01/2012  FINDINGS Scattered large and small bowel gas is noted. No free air is seen. No abnormal mass or abnormal calcifications are noted. Mild degenerative changes of the lumbar spine are seen.  IMPRESSION No acute abnormality noted.  SIGNATURE  Electronically Signed   By: Inez Catalina M.D.   On: 10/04/2013 11:03   Medications:   . aztreonam  1 g Intravenous Q24H  . collagenase   Topical Daily  . feeding supplement (NEPRO CARB STEADY)  237 mL Oral BID BM  . heparin  5,000 Units Subcutaneous 3 times per day  . insulin aspart  0-9 Units Subcutaneous 6 times per day  . labetalol  300 mg Oral BID  . neomycin-bacitracin-polymyxin   Topical TID  . sodium chloride  3 mL Intravenous Q12H  . vancomycin  750 mg Intravenous Q T,Th,Sa-HD

## 2013-10-06 NOTE — Progress Notes (Signed)
PT Cancellation Note  Patient Details Name: Oscar Castillo MRN: [id number] DOB: [birth year]   Cancelled Treatment:    Reason Eval/Treat Not Completed: Fatigue/lethargy limiting ability to participate.  Attempted to see patient x2 today.  On both occasions, patient very lethargic - unable to participate with PT.  Will return at later date to attempt PT.  Awaiting Palliative Care consult.   Despina Pole 10/06/2013, 5:07 PM Carita Pian. Sanjuana Kava, [place] Pager [phone]

## 2013-10-06 NOTE — Progress Notes (Signed)
[facility] PROGRESS NOTE  Oscar Castillo FUX:[id number] DOB: [birth year] DOA: 10/01/2013 PCP: [name], MD  Assessment/Plan:  Encephalopathy, metabolic  -much more alert today- answering questions easily- remains confused  -Suspect dehydration and possible underlying infectious processes as etiology; 3/13 patient's cognition appears to be clearing. Able to answer simple questions   New LBBB  -has been transient in nature  -systolic function normal but septal motion with abnormal function and dyssynergy  -cardiac enzymes negative  -doubt pt appropriate for invasive cardiac work up   Dehydration /FTT (failure to thrive) in adult  -Continue IV fluid-type of fluid adjusted by nephrology today  - Speech and Language Pathologist (SLP) attempted 10/02/2013 the patient unable to arouse enough to eval but later re examined and now on D1 diet  -Patient not eating will restart his IVF to D5 NS 50/hr  Fever  -Unclear etiology- suspect DH and atelectasis  -question of right lower lobe right but currently is not hypoxic  -He did have leukocytosis so continue empiric antibiotics as precaution  -Patient unable to contribute to history and given altered mentation and fever an LP was done to rule out possible meningitis-CSF studies pending-partial results with 35 of protein and 156 of glucose so not indicative of bacterial meningitis-CSF culture no growth  -Influenza PCR negative  -Medication reconciliation list that patient was on doxycycline prior to admission which was started on 09/29/2013 or suspected postoperative wound infection/possible osteomyelitis and scheduled to go for 3 weeks  -Blood cultures no growth to date urinalysis unremarkable  -3/13 Recent surgical site infected? Right BKA stump MRI pending    HYPERTENSION  -Blood pressure controlled  -on IV Lopressor every 6 hours  -Continue labetalol 300 mg BID   Anemia due to chronic illness  -Hemoglobin has trended down to 13 after  hydration noting baseline just under 11  -Monitor closely currently at 11.3  DM (diabetes mellitus) type I controlled with renal manifestation  -On Lantus prior to admission, however patient still not eating well.   -Increase to resistant SSI   CKD (chronic kidney disease) stage V requiring chronic dialysis  -Usual dialysis days Tuesday Thursday Saturday  -No ultrafiltration secondary to ongoing dehydration   Chronic combined systolic (EF 02%) and grade 2 diastolic congestive heart failure  -Compensated and managed with dialysis   HEPATITIS C, HX OF   CEREBROVASCULAR ACCIDENT, HX OF -No evidence of acute infarct   Hyperlipidemia -Obtain lipid panel  Right BKA -Pain is increasing since surgery in February 2015, infection (osteomyelitis) vs RSD? -MRI of right BKA stump pending    Code Status: Full Family Communication:  Wife and sister present for discussion of plan of care  Disposition Plan: We will await recommendations from palliative care   Consultants: Dr. Roney Jaffe Nephrology  Dr Meridee Score (Orthopedic Surgery) Dr. Rhea Pink (palliative care) Radiology for lumbar puncture   Procedures: CT head without contrast 10/01/2013 1. There is no evidence of an acute intracranial hemorrhage nor of  an evolving ischemic infarction.  2. There is stable diffuse atrophy with evidence of chronic small  vessel ischemic change.  3. There is no intracranial mass effect.  4. There is no evidence of an acute skull fracture.   Lumbar puncture by interventional radiology   Antibiotics: Aztreonam 3/08 >>>  Vancomycin 3/08 >>>     HPI/Subjective: 63 year old male PMHx HTN, HLD, Hx diabetes type 1 controlled, vasculopath, S/P recent right BKA on 08/2013. He also has a prior history of CVA in 2009  and is on chronic hemodialysis. In addition he has combined systolic& diastolic heart failure with an EF of 45%. According to the patient's wife he had been doing well in the  postoperative period until around the same time he followed up with Dr. Sharol Given. At that time he started with poor intake and gradually increasing altered mentation. 09/24/2013 he was very confused when not work with the therapist. On 09/26/2013 a meeting was called at the nursing facility because he was experiencing extreme symptoms of failure to thrive and they were concerned the patient was depressed. Palliative care team had apparently been meeting with the family prior to admission. By 09/29/2013 he was a little more coherent and began trying to eat but later in the morning he would not take his meds kept his mouth shut.  Postoperatively he has had 2 courses of antibiotics for concerns of possible stump infection. In regards to dietary intake: in addition to the above the patient has been on a dysphagia diet with pured foods begun after the patient was noted to be coughing with attempts to eat.  Upon presentation to the emergency department EMS reported that the wife found food in the patient's mouth and he was unresponsive. His CBG was 379. He was hypoxemic and placed on a nonrebreather mask. His hemoglobin at presentation was 16.7 which was markedly elevated compared to prior readings between 9.9 and 11.5. He was also noted to have significant decreased breath sounds on the right without wheezing or crackles. The right BKA stump was unremarkable in appearance. Was also found to be mildly febrile with a rectal temperature of 101. Chest x-ray was concerning for possible right lower lobe pneumonia. 3/12 patient just returned from HD somewhat lethargic, complained of right BKA stump pain which has been increasing over several days 3/13 patient interacting with medical staff appropriately, able to participate in dressing change of right BKA   Objective: Filed Vitals:   10/06/13 0512 10/06/13 0620 10/06/13 0736 10/06/13 1130  BP: 143/78  151/83 144/121  Pulse: 84  85 84  Temp: 98.6 F (37 C) 98.9 F (37.2  C) 98.9 F (37.2 C) 97.9 F (36.6 C)  TempSrc: Oral Oral Oral Oral  Resp: 16  18 17   Height:      Weight:      SpO2: 99%  99% 100%    Intake/Output Summary (Last 24 hours) at 10/06/13 1437 Last data filed at 10/05/13 1506  Gross per 24 hour  Intake      0 ml  Output    205 ml  Net   -205 ml   Filed Weights   10/05/13 1039 10/05/13 1506 10/05/13 2045  Weight: 76 kg (167 lb 8.8 oz) 69 kg (152 lb 1.9 oz) 68.3 kg (150 lb 9.2 oz)    Exam:   General:  Lethargic, but arousable will answer some questions especially when family present  Cardiovascular: Regular rhythm and rate, negative murmurs rubs gallop  Respiratory: Clear to auscultation bilateral  Abdomen: Soft, nontender, nondistended plus bowel sound  Musculoskeletal: Bilateral BKA, left BKA performed November 2014 still remains sore to palpation; right BKA performed February 2015 extremely tender to palpation along the anterior/right lateral aspect of stump. Negative discharge from right stump, however warm to touch      Data Reviewed: Basic Metabolic Panel:  Recent Labs Lab 10/02/13 0255 10/03/13 0304 10/04/13 0429 10/05/13 0749 10/06/13 0620  NA 144 145 135* 132* 139  K 4.5 4.9 4.2 3.9 4.4  CL 95*  95* 88* 87* 95*  CO2 24 25 28 24 27   GLUCOSE 178* 160* 174* 222* 174*  BUN 61* 85* 44* 61* 29*  CREATININE 7.58* 9.88* 5.80* 7.02* 4.36*  CALCIUM 11.1* 11.3* 10.5 10.2 9.9  MG  --   --   --  2.3 2.1  PHOS  --   --  5.1*  --   --    Liver Function Tests:  Recent Labs Lab 10/01/13 1135 10/02/13 0255 10/04/13 0429 10/05/13 0749 10/06/13 0620  AST 35 30  --  47* 32  ALT 13 12  --  34 25  ALKPHOS 75 79  --  66 60  BILITOT 0.5 0.5  --  0.5 0.4  PROT 10.4* 9.5*  --  8.5* 8.1  ALBUMIN 3.4* 3.1* 3.1* 2.7* 2.9*   No results found for this basename: LIPASE, AMYLASE,  in the last 168 hours  Recent Labs Lab 10/01/13 1201  AMMONIA 29   CBC:  Recent Labs Lab 10/01/13 1135  10/02/13 1112 10/03/13 0304  10/04/13 0429 10/05/13 0749 10/06/13 0620  WBC 12.6*  --  15.0* 12.5* 11.0* 8.8 8.5  NEUTROABS 9.2*  --   --   --   --  5.8 5.5  HGB 13.7  < > 13.6 12.7* 12.5* 12.6* 11.3*  HCT 41.3  < > 40.2 37.9* 37.5* 38.2* 34.7*  MCV 86.8  --  85.4 84.4 84.7 82.9 84.2  PLT 262  --  258 306 308 310 310  < > = values in this interval not displayed. Cardiac Enzymes:  Recent Labs Lab 10/01/13 1135 10/02/13 1630 10/02/13 2151 10/03/13 0304  CKTOTAL  --  351* 339* 345*  CKMB  --  2.2 2.5 2.9  TROPONINI <0.30 <0.30 <0.30 <0.30   BNP (last 3 results)  Recent Labs  10/01/13 1135  PROBNP 1397.0*   CBG:  Recent Labs Lab 10/05/13 0744 10/05/13 1710 10/05/13 2114 10/06/13 0735 10/06/13 1133  GLUCAP 236* 145* 153* 158* 120*    Recent Results (from the past 240 hour(s))  CULTURE, BLOOD (ROUTINE X 2)     Status: None   Collection Time    10/01/13 11:35 AM      Result Value Ref Range Status   Specimen Description BLOOD RIGHT WRIST   Final   Special Requests BOTTLES DRAWN AEROBIC ONLY 10CC   Final   Culture  Setup Time     Final   Value: 10/01/2013 19:00     Performed at [facility]   Culture     Final   Value:        BLOOD CULTURE RECEIVED NO GROWTH TO DATE CULTURE WILL BE HELD FOR 5 DAYS BEFORE ISSUING A FINAL NEGATIVE REPORT     Performed at [facility]   Report Status PENDING   Incomplete  CULTURE, BLOOD (ROUTINE X 2)     Status: None   Collection Time    10/01/13 12:01 PM      Result Value Ref Range Status   Specimen Description BLOOD RIGHT FOREARM   Final   Special Requests BOTTLES DRAWN AEROBIC AND ANAEROBIC 5CC   Final   Culture  Setup Time     Final   Value: 10/01/2013 18:59     Performed at [facility]   Culture     Final   Value:        BLOOD CULTURE RECEIVED NO GROWTH TO DATE CULTURE WILL BE HELD FOR 5 DAYS BEFORE ISSUING A  FINAL NEGATIVE REPORT     Performed at [facility]   Report Status PENDING   Incomplete  CSF CULTURE      Status: None   Collection Time    10/02/13 10:55 AM      Result Value Ref Range Status   Specimen Description CSF   Final   Special Requests Normal   Final   Gram Stain     Final   Value: FEW WBC PRESENT, PREDOMINANTLY MONONUCLEAR     NO ORGANISMS SEEN     Performed at [facility]   Culture     Final   Value: NO GROWTH 3 DAYS     Performed at [facility]   Report Status 10/06/2013 FINAL   Final  GRAM STAIN     Status: None   Collection Time    10/02/13 10:55 AM      Result Value Ref Range Status   Specimen Description CSF   Final   Special Requests Normal   Final   Gram Stain     Final   Value: CYTOSPIN PREP     WBC PRESENT, PREDOMINANTLY MONONUCLEAR     NO ORGANISMS SEEN   Report Status 10/02/2013 FINAL   Final  MRSA PCR SCREENING     Status: None   Collection Time    10/02/13 11:18 PM      Result Value Ref Range Status   MRSA by PCR NEGATIVE  NEGATIVE Final   Comment:            The GeneXpert MRSA Assay (FDA     approved for NASAL specimens     only), is one component of a     comprehensive MRSA colonization     surveillance program. It is not     intended to diagnose MRSA     infection nor to guide or     monitor treatment for     MRSA infections.  CLOSTRIDIUM DIFFICILE BY PCR     Status: None   Collection Time    10/05/13  2:05 AM      Result Value Ref Range Status   C difficile by pcr NEGATIVE  NEGATIVE Final     Studies: No results found.  Scheduled Meds: . aztreonam  1 g Intravenous Q24H  . collagenase   Topical Daily  . feeding supplement (NEPRO CARB STEADY)  237 mL Oral BID BM  . heparin  5,000 Units Subcutaneous 3 times per day  . insulin aspart  0-9 Units Subcutaneous 6 times per day  . labetalol  300 mg Oral BID  . neomycin-bacitracin-polymyxin   Topical TID  . sodium chloride  3 mL Intravenous Q12H  . vancomycin  750 mg Intravenous Q T,Th,Sa-HD   Continuous Infusions:    Active Problems:   HYPERTENSION   HEPATITIS C, HX  OF   CEREBROVASCULAR ACCIDENT, HX OF   BENIGN PROSTATIC HYPERTROPHY   Hyperlipidemia   Anemia due to chronic illness   DM (diabetes mellitus) type I controlled with renal manifestation   Chronic combined systolic (EF 71%) and grade 2diastolic congestive heart failure   FTT (failure to thrive) in adult   Encephalopathy, metabolic   Dehydration   Fever    Time spent: 60 minutes    WOODS, CURTIS, J  [facility] Pager [phone]. If 7PM-7AM, please contact night-coverage at [email or web address], password [facility] 10/06/2013, 2:37 PM  LOS: 5 days

## 2013-10-06 NOTE — Progress Notes (Signed)
Speech Language Pathology Treatment: Dysphagia  Patient Details Name: Oscar Castillo MRN: [id number] DOB: [birth year] Today's Date: 10/06/2013 Time: 0810-0830 SLP Time Calculation (min): 20 min  Assessment / Plan / Recommendation Clinical Impression  Pt awake with eyes intermittently open. Does not respond to to total assist techniques for feeding. Pt does not allow POs to enter mouth unless by force. Then, he holds bolus needing max cues to initiate swallow. No evidence of aspiration, but no real PO intake possible due to cognition. Will continue diet order and attempts to feed can be made, but adequate intake unlikely. Discussed with RN and Nephrology PA.    HPI HPI: 63 y.o. year-old with hx of HTN, DM2, PVD with bilat BKA, BPH, depression, and hep C.  Patient was here 2/13 - 09/11/13 for R BKA.  Discharged to SNF. According to H&P patient was confused and not working wt PT at the SNF and a palliative care meeting was held with family on March 3rd due to FTT and suspected depression.  Then he did a little better but yest am was found unresponsive.  Now with encephalopathy, FTT, dehydration, fever of unknown etiology.  Per notes, pt has been on pureed diet at SNF.    Pertinent Vitals NA  SLP Plan  Continue with current plan of care    Recommendations Diet recommendations: Dysphagia 1 (puree);Thin liquid Liquids provided via: Cup;Straw Medication Administration: Crushed with puree Supervision: Full supervision/cueing for compensatory strategies Compensations: Slow rate;Small sips/bites;Check for pocketing Postural Changes and/or Swallow Maneuvers: Seated upright 90 degrees              Oral Care Recommendations: Oral care BID Follow up Recommendations: Skilled Nursing facility Plan: Continue with current plan of care    GO    [facility], MA CCC-SLP [phone]  Lynann Beaver 10/06/2013, 10:08 AM

## 2013-10-06 NOTE — Clinical Social Work Note (Signed)
CSW continuing to monitor patient's progress and will assist with discharge disposition as appropriate when medically stable.  Nori Poland Givens, MSW, LCSW [phone]

## 2013-10-07 DIAGNOSIS — L02419 Cutaneous abscess of limb, unspecified: Secondary | ICD-10-CM

## 2013-10-07 DIAGNOSIS — Z515 Encounter for palliative care: Secondary | ICD-10-CM

## 2013-10-07 DIAGNOSIS — M869 Osteomyelitis, unspecified: Secondary | ICD-10-CM

## 2013-10-07 DIAGNOSIS — L03119 Cellulitis of unspecified part of limb: Secondary | ICD-10-CM

## 2013-10-07 DIAGNOSIS — D638 Anemia in other chronic diseases classified elsewhere: Secondary | ICD-10-CM

## 2013-10-07 LAB — CBC WITH DIFFERENTIAL/PLATELET
BASOS ABS: 0 10*3/uL (ref 0.0–0.1)
BASOS PCT: 0 % (ref 0–1)
Eosinophils Absolute: 0.2 10*3/uL (ref 0.0–0.7)
Eosinophils Relative: 2 % (ref 0–5)
HEMATOCRIT: 32.1 % — AB (ref 39.0–52.0)
HEMOGLOBIN: 10.6 g/dL — AB (ref 13.0–17.0)
LYMPHS PCT: 25 % (ref 12–46)
Lymphs Abs: 2.3 10*3/uL (ref 0.7–4.0)
MCH: 27.5 pg (ref 26.0–34.0)
MCHC: 33 g/dL (ref 30.0–36.0)
MCV: 83.2 fL (ref 78.0–100.0)
MONOS PCT: 11 % (ref 3–12)
Monocytes Absolute: 1 10*3/uL (ref 0.1–1.0)
NEUTROS ABS: 5.8 10*3/uL (ref 1.7–7.7)
Neutrophils Relative %: 63 % (ref 43–77)
Platelets: 342 10*3/uL (ref 150–400)
RBC: 3.86 MIL/uL — ABNORMAL LOW (ref 4.22–5.81)
RDW: 18.2 % — AB (ref 11.5–15.5)
WBC: 9.2 10*3/uL (ref 4.0–10.5)

## 2013-10-07 LAB — LIPID PANEL
CHOL/HDL RATIO: 5.3 ratio
Cholesterol: 127 mg/dL (ref 0–200)
HDL: 24 mg/dL — ABNORMAL LOW (ref >39)
LDL Cholesterol: 76 mg/dL (ref 0–99)
Triglycerides: 133 mg/dL (ref <150)
VLDL: 27 mg/dL (ref 0–40)

## 2013-10-07 LAB — COMPREHENSIVE METABOLIC PANEL
ALT: 18 U/L (ref 0–53)
AST: 23 U/L (ref 0–37)
Albumin: 2.7 g/dL — ABNORMAL LOW (ref 3.5–5.2)
Alkaline Phosphatase: 60 U/L (ref 39–117)
BUN: 43 mg/dL — AB (ref 6–23)
CALCIUM: 9.6 mg/dL (ref 8.4–10.5)
CO2: 27 mEq/L (ref 19–32)
Chloride: 95 mEq/L — ABNORMAL LOW (ref 96–112)
Creatinine, Ser: 6.1 mg/dL — ABNORMAL HIGH (ref 0.50–1.35)
GFR, EST AFRICAN AMERICAN: 10 mL/min — AB (ref >90)
GFR, EST NON AFRICAN AMERICAN: 9 mL/min — AB (ref >90)
GLUCOSE: 184 mg/dL — AB (ref 70–99)
Potassium: 4.6 mEq/L (ref 3.7–5.3)
Sodium: 139 mEq/L (ref 137–147)
TOTAL PROTEIN: 7.5 g/dL (ref 6.0–8.3)
Total Bilirubin: 0.3 mg/dL (ref 0.3–1.2)

## 2013-10-07 LAB — CULTURE, BLOOD (ROUTINE X 2)
CULTURE: NO GROWTH
Culture: NO GROWTH

## 2013-10-07 LAB — GLUCOSE, CAPILLARY
Glucose-Capillary: 101 mg/dL — ABNORMAL HIGH (ref 70–99)
Glucose-Capillary: 133 mg/dL — ABNORMAL HIGH (ref 70–99)
Glucose-Capillary: 138 mg/dL — ABNORMAL HIGH (ref 70–99)
Glucose-Capillary: 160 mg/dL — ABNORMAL HIGH (ref 70–99)

## 2013-10-07 LAB — MAGNESIUM: Magnesium: 2.1 mg/dL (ref 1.5–2.5)

## 2013-10-07 MED ORDER — HALOPERIDOL LACTATE 5 MG/ML IJ SOLN
2.0000 mg | Freq: Four times a day (QID) | INTRAMUSCULAR | Status: DC | PRN
Start: 2013-10-07 — End: 2013-10-07

## 2013-10-07 MED ORDER — LORAZEPAM 2 MG/ML IJ SOLN
INTRAMUSCULAR | Status: AC
  Filled 2013-10-07: qty 1

## 2013-10-07 MED ORDER — OXYCODONE HCL 5 MG PO TABS
ORAL_TABLET | ORAL | Status: AC
  Filled 2013-10-07: qty 1

## 2013-10-07 MED ORDER — HALOPERIDOL 1 MG PO TABS
1.0000 mg | ORAL_TABLET | Freq: Four times a day (QID) | ORAL | Status: DC | PRN
  Administered 2013-10-11: 1 mg via ORAL
  Filled 2013-10-07 (×2): qty 1

## 2013-10-07 MED ORDER — PIPERACILLIN-TAZOBACTAM IN DEX 2-0.25 GM/50ML IV SOLN
2.2500 g | Freq: Three times a day (TID) | INTRAVENOUS | Status: DC
  Administered 2013-10-07 – 2013-10-12 (×14): 2.25 g via INTRAVENOUS
  Filled 2013-10-07 (×19): qty 50

## 2013-10-07 MED ORDER — LORAZEPAM 2 MG/ML IJ SOLN
2.0000 mg | Freq: Once | INTRAMUSCULAR | Status: AC
  Administered 2013-10-07: 2 mg via INTRAVENOUS

## 2013-10-07 MED ORDER — MORPHINE SULFATE 2 MG/ML IJ SOLN
1.0000 mg | INTRAMUSCULAR | Status: DC | PRN
  Administered 2013-10-07: 1 mg via INTRAVENOUS
  Administered 2013-10-07 – 2013-10-08 (×2): 2 mg via INTRAVENOUS
  Administered 2013-10-08: 1 mg via INTRAVENOUS
  Administered 2013-10-08 – 2013-10-18 (×14): 2 mg via INTRAVENOUS
  Administered 2013-10-18: 1 mg via INTRAVENOUS
  Administered 2013-10-19 – 2013-10-26 (×11): 2 mg via INTRAVENOUS
  Administered 2013-10-27: 1 mg via INTRAVENOUS
  Administered 2013-10-27 – 2013-10-30 (×7): 2 mg via INTRAVENOUS
  Filled 2013-10-07 (×38): qty 1

## 2013-10-07 MED ORDER — HALOPERIDOL LACTATE 5 MG/ML IJ SOLN
2.0000 mg | Freq: Once | INTRAMUSCULAR | Status: AC
  Administered 2013-10-07: 2 mg via INTRAVENOUS
  Filled 2013-10-07: qty 0.4

## 2013-10-07 NOTE — Procedures (Signed)
I was present at this dialysis session, have reviewed the session itself and made  appropriate changes  Kelly Splinter MD (pgr) [phone]    ([phone] 10/07/2013, 10:31 AM

## 2013-10-07 NOTE — Progress Notes (Signed)
Subjective:     Patient appears comfortable receiving dialysis.  Objective: Vital signs in last 24 hours: Temp:  [97.5 F (36.4 C)-98.6 F (37 C)] 97.5 F (36.4 C) (03/14 0731) Pulse Rate:  [79-93] 86 (03/14 0930) Resp:  [17-18] 18 (03/14 0731) BP: (104-161)/(66-121) 124/81 mmHg (03/14 0930) SpO2:  [99 %-100 %] 99 % (03/14 0731) Weight:  [70.6 kg (155 lb 10.3 oz)-73 kg (160 lb 15 oz)] 73 kg (160 lb 15 oz) (03/14 0731)  Intake/Output from previous day: 03/13 0701 - 03/14 0700 In: 350 [I.V.:350] Out: -  Intake/Output this shift:     Recent Labs  10/05/13 0749 10/06/13 0620 10/07/13 0825  HGB 12.6* 11.3* 10.6*    Recent Labs  10/06/13 0620 10/07/13 0825  WBC 8.5 9.2  RBC 4.12* 3.86*  HCT 34.7* 32.1*  PLT 310 342    Recent Labs  10/06/13 0620 10/07/13 0825  NA 139 139  K 4.4 4.6  CL 95* 95*  CO2 27 27  BUN 29* 43*  CREATININE 4.36* 6.10*  GLUCOSE 174* 184*  CALCIUM 9.9 9.6   No results found for this basename: LABPT, INR,  in the last 72 hours  Dressing right stump clean dry and intact  Assessment/Plan: Continue current care  CLARK, GILBERT 10/07/2013, 9:35 AM

## 2013-10-07 NOTE — Consult Note (Signed)
Palliative Medicine Team at [facility]  Date: 10/07/2013   Patient Name: Oscar Castillo  DOB: 1950-10-18  MRN: [id number]  Age / Sex: 63 y.o., male   PCP: [name], MD Referring Physician: Allie Bossier, MD  HPI/Reason for Consultation: 63 yo with ESRD, Bilateral BKA with recurrent infection bacteremia with source being found as an abscess in the right stump. He has a persistent encephalopathy - likely due to delirium/infection/chronic illness. PMT consulted for goals of care. Patient not able to participate in his decision making.  Participants in Discussion: Patient's wife, sister and BIL  Goals/Summary of Discussion:  1. Code Status:  FULL CODE- discussed at length and a strong recommendation was made for no ACLS but wife unable to make this decision-she wants patient to make this decision if his mental status clears.  2. Scope of Treatment:  Full medical treatment and intervention including surgery if indicated.  I will complete MOST form before discharge with family- would be great if patient could make his own decisions regarding care- there is  Disconnect between wife's wishes and her husbands requests previously.  We discussed d/c planning-she wants to take him home-but I am not sure she can transport him to dialysis.  3. Assessment/Plan:  Primary Diagnoses  1. ESRD on HD 2. Stump Abscess 3. Bacteremia/Sepsis 4. DM  Prognosis : uncertain disease trajectory-would depend on more specific goals  PPS:30   Active Symptoms 1. Pain, stump 2. Encephalopathy  4. Palliative Prophylaxis:   Bowel Regimen   Terminal Secretions  Breakthrough Pain and Dyspnea   Agitation and Delirium  Nausea  5. Psychosocial Spiritual Asssessment/Interventions:  Patient and Family Adjustment to Illness/Prognosis: Difficulty understanding full medial severity of illness.  Spiritual Concerns or Needs: high  6. Disposition: To be determined.  Time: 7510-2585 Greater than  50%  of this time was spent counseling and coordinating care related to the above assessment and plan.  Signed by: Acquanetta Chain, DO  10/07/2013, 1:19 PM  Please contact Palliative Medicine Team phone at [phone] for questions and concerns.

## 2013-10-07 NOTE — Progress Notes (Signed)
[facility] PROGRESS NOTE  Oscar Castillo HKV:[id number] DOB: 10-19-1950 DOA: 10/01/2013 PCP: [name], MD  Assessment/Plan:  Encephalopathy, metabolic  -much more alert today- answering questions easily- remains confused  -Suspect dehydration and possible underlying infectious processes as etiology; 3/13 patient's cognition appears to be clearing. Patient eating and answering simple questions  -Patient also expressing the severity of the right BKA stump pain.  New LBBB  -has been transient in nature  -systolic function normal but septal motion with abnormal function and dyssynergy  -cardiac enzymes negative  -doubt pt appropriate for invasive cardiac work up   Dehydration /FTT (failure to thrive) in adult  -Continue IV fluid-type of fluid adjusted by nephrology today  - Speech and Language Pathologist (SLP) attempted 10/02/2013 the patient unable to arouse enough to eval but later re examined and now on D1 diet  -Patient not eating will restart his IVF to D5 NS 50/hr  Fever  -Unclear etiology- suspect DH and atelectasis  -question of right lower lobe right but currently is not hypoxic  -He did have leukocytosis so continue empiric antibiotics as precaution  -Patient unable to contribute to history and given altered mentation and fever an LP was done to rule out possible meningitis-CSF studies pending-partial results with 35 of protein and 156 of glucose so not indicative of bacterial meningitis-CSF culture no growth  -Influenza PCR negative  -Medication reconciliation list that patient was on doxycycline prior to admission which was started on 09/29/2013 or suspected postoperative wound infection/possible osteomyelitis and scheduled to go for 3 weeks  -Blood cultures no growth to date urinalysis unremarkable  -3/14 Right BKA stump shows abscess/osteomyelitis by MRI;; see results below     HYPERTENSION  -Blood pressure controlled  -on IV Lopressor every 6 hours  -Continue  labetalol 300 mg BID   Anemia due to chronic illness  -Hemoglobin has trended down to 13 after hydration noting baseline just under 11  -Monitor closely currently at 11.3  DM (diabetes mellitus) type I controlled with renal manifestation  -On Lantus prior to admission, however patient still not eating well.   -Increase to resistant SSI   CKD (chronic kidney disease) stage V requiring chronic dialysis  -Usual dialysis days Tuesday Thursday Saturday  -No ultrafiltration secondary to ongoing dehydration   Chronic combined systolic (EF 56%) and grade 2 diastolic congestive heart failure  -Compensated and managed with dialysis on T/Th/Sat  HEPATITIS C, HX OF   CEREBROVASCULAR ACCIDENT, HX OF -No evidence of acute infarct   Hyperlipidemia -Obtain lipid panel  Right BKA -Pain is increasing since surgery in February 2015, infection (osteomyelitis) vs RSD? -3/14 MRI of right BKA stump shows abscess/osteomyelitis see results below   Osteomyelitis right BKA stump -Start Zosyn + vancomycin -Spoke with Dr. Jean Rosenthal (orthopedic surgery), notify him of MRI findings. Agrees with starting aggressive antibiotics will evaluate patient for surgical debridement -Patient writhing in pain secondary to infection of right BKA stump infection. Will write for morphine 1-2 mg q. 4 hours PRN severe pain.   Code Status: Full Family Communication:  Wife and sister present for discussion of plan of care  Disposition Plan: We will await recommendations from palliative care   Consultants: Dr. Roney Jaffe Nephrology  Dr Meridee Score (Orthopedic Surgery) Dr. Jean Rosenthal (orthopedic surgery) Dr. Rhea Pink (palliative care) Radiology for lumbar puncture   Procedures: MRI tib-fib right BKA stump 10/06/2013 Findings consistent with a focal abscess at the tip of the stumps of  the distal tibia and fibula.  Subtle edema at the tip of the stumps of the tibia and fibula most  likely  represent focal osteomyelitis because the fluid collections,  which probably represent pus, are immediately adjacent to the bones.  but there is no discrete bone destruction.  Probable adjacent myositis/cellulitis.   CT head without contrast 10/01/2013 1. There is no evidence of an acute intracranial hemorrhage nor of  an evolving ischemic infarction.  2. There is stable diffuse atrophy with evidence of chronic small  vessel ischemic change.  3. There is no intracranial mass effect.  4. There is no evidence of an acute skull fracture.   Lumbar puncture by interventional radiology   Antibiotics: Zosyn 3/14>> Vancomycin (full dose) 3/14>> Aztreonam 3/08 >>> stopped 3/14 Vancomycin 3/08 >>> stopped 3/14      HPI/Subjective: 63 year old male PMHx HTN, HLD, Hx diabetes type 1 controlled, vasculopath, S/P recent right BKA on 08/2013. He also has a prior history of CVA in 2009 and is on chronic hemodialysis. In addition he has combined systolic& diastolic heart failure with an EF of 45%. According to the patient's wife he had been doing well in the postoperative period until around the same time he followed up with Dr. Sharol Given. At that time he started with poor intake and gradually increasing altered mentation. 09/24/2013 he was very confused when not work with the therapist. On 09/26/2013 a meeting was called at the nursing facility because he was experiencing extreme symptoms of failure to thrive and they were concerned the patient was depressed. Palliative care team had apparently been meeting with the family prior to admission. By 09/29/2013 he was a little more coherent and began trying to eat but later in the morning he would not take his meds kept his mouth shut.  Postoperatively he has had 2 courses of antibiotics for concerns of possible stump infection. In regards to dietary intake: in addition to the above the patient has been on a dysphagia diet with pured foods begun after the patient  was noted to be coughing with attempts to eat.  Upon presentation to the emergency department EMS reported that the wife found food in the patient's mouth and he was unresponsive. His CBG was 379. He was hypoxemic and placed on a nonrebreather mask. His hemoglobin at presentation was 16.7 which was markedly elevated compared to prior readings between 9.9 and 11.5. He was also noted to have significant decreased breath sounds on the right without wheezing or crackles. The right BKA stump was unremarkable in appearance. Was also found to be mildly febrile with a rectal temperature of 101. Chest x-ray was concerning for possible right lower lobe pneumonia. 3/12 patient just returned from HD somewhat lethargic, complained of right BKA stump pain which has been increasing over several days 3/13 patient interacting with medical staff appropriately, able to participate in dressing change of right BKA 3/14 patient alert speaking in full sentences, sitting in bed eating lunch. Writhing in pain and indicating pain is in his right BKA stump.   Objective: Filed Vitals:   10/07/13 1130 10/07/13 1200 10/07/13 1203 10/07/13 1312  BP: 124/81 112/73 129/80 180/94  Pulse: 87 85 86 92  Temp:   98 F (36.7 C) 99.1 F (37.3 C)  TempSrc:   Axillary   Resp:   18 17  Height:      Weight:   72 kg (158 lb 11.7 oz)   SpO2:   98% 100%    Intake/Output Summary (Last 24 hours) at 10/07/13 1418 Last  data filed at 10/07/13 1203  Gross per 24 hour  Intake    350 ml  Output   1001 ml  Net   -651 ml   Filed Weights   10/06/13 2124 10/07/13 0731 10/07/13 1203  Weight: 70.6 kg (155 lb 10.3 oz) 73 kg (160 lb 15 oz) 72 kg (158 lb 11.7 oz)    Exam:   General:  Alert, follows all commands, severe discomfort secondary to right BKA stump pain   Cardiovascular: Regular rhythm and rate, negative murmurs rubs gallop  Respiratory: Clear to auscultation bilateral  Abdomen: Soft, nontender, nondistended plus bowel  sound  Musculoskeletal: Bilateral BKA, left BKA performed November 2014 still remains sore to palpation; right BKA performed February 2015 extremely tender to palpation along the anterior/right lateral aspect of stump. Negative discharge from right stump, however warm to touch      Data Reviewed: Basic Metabolic Panel:  Recent Labs Lab 10/03/13 0304 10/04/13 0429 10/05/13 0749 10/06/13 0620 10/07/13 0825  NA 145 135* 132* 139 139  K 4.9 4.2 3.9 4.4 4.6  CL 95* 88* 87* 95* 95*  CO2 25 28 24 27 27   GLUCOSE 160* 174* 222* 174* 184*  BUN 85* 44* 61* 29* 43*  CREATININE 9.88* 5.80* 7.02* 4.36* 6.10*  CALCIUM 11.3* 10.5 10.2 9.9 9.6  MG  --   --  2.3 2.1 2.1  PHOS  --  5.1*  --   --   --    Liver Function Tests:  Recent Labs Lab 10/01/13 1135 10/02/13 0255 10/04/13 0429 10/05/13 0749 10/06/13 0620 10/07/13 0825  AST 35 30  --  47* 32 23  ALT 13 12  --  34 25 18  ALKPHOS 75 79  --  66 60 60  BILITOT 0.5 0.5  --  0.5 0.4 0.3  PROT 10.4* 9.5*  --  8.5* 8.1 7.5  ALBUMIN 3.4* 3.1* 3.1* 2.7* 2.9* 2.7*   No results found for this basename: LIPASE, AMYLASE,  in the last 168 hours  Recent Labs Lab 10/01/13 1201  AMMONIA 29   CBC:  Recent Labs Lab 10/01/13 1135  10/03/13 0304 10/04/13 0429 10/05/13 0749 10/06/13 0620 10/07/13 0825  WBC 12.6*  < > 12.5* 11.0* 8.8 8.5 9.2  NEUTROABS 9.2*  --   --   --  5.8 5.5 5.8  HGB 13.7  < > 12.7* 12.5* 12.6* 11.3* 10.6*  HCT 41.3  < > 37.9* 37.5* 38.2* 34.7* 32.1*  MCV 86.8  < > 84.4 84.7 82.9 84.2 83.2  PLT 262  < > 306 308 310 310 342  < > = values in this interval not displayed. Cardiac Enzymes:  Recent Labs Lab 10/01/13 1135 10/02/13 1630 10/02/13 2151 10/03/13 0304  CKTOTAL  --  351* 339* 345*  CKMB  --  2.2 2.5 2.9  TROPONINI <0.30 <0.30 <0.30 <0.30   BNP (last 3 results)  Recent Labs  10/01/13 1135  PROBNP 1397.0*   CBG:  Recent Labs Lab 10/06/13 1133 10/06/13 1710 10/06/13 2008 10/07/13 0027  10/07/13 0411  GLUCAP 120* 200* 213* 138* 101*    Recent Results (from the past 240 hour(s))  CULTURE, BLOOD (ROUTINE X 2)     Status: None   Collection Time    10/01/13 11:35 AM      Result Value Ref Range Status   Specimen Description BLOOD RIGHT WRIST   Final   Special Requests BOTTLES DRAWN AEROBIC ONLY 10CC   Final   Culture  Setup Time     Final   Value: 10/01/2013 19:00     Performed at [facility]   Culture     Final   Value: NO GROWTH 5 DAYS     Performed at [facility]   Report Status 10/07/2013 FINAL   Final  CULTURE, BLOOD (ROUTINE X 2)     Status: None   Collection Time    10/01/13 12:01 PM      Result Value Ref Range Status   Specimen Description BLOOD RIGHT FOREARM   Final   Special Requests BOTTLES DRAWN AEROBIC AND ANAEROBIC 5CC   Final   Culture  Setup Time     Final   Value: 10/01/2013 18:59     Performed at [facility]   Culture     Final   Value: NO GROWTH 5 DAYS     Performed at [facility]   Report Status 10/07/2013 FINAL   Final  CSF CULTURE     Status: None   Collection Time    10/02/13 10:55 AM      Result Value Ref Range Status   Specimen Description CSF   Final   Special Requests Normal   Final   Gram Stain     Final   Value: FEW WBC PRESENT, PREDOMINANTLY MONONUCLEAR     NO ORGANISMS SEEN     Performed at [facility]   Culture     Final   Value: NO GROWTH 3 DAYS     Performed at [facility]   Report Status 10/06/2013 FINAL   Final  GRAM STAIN     Status: None   Collection Time    10/02/13 10:55 AM      Result Value Ref Range Status   Specimen Description CSF   Final   Special Requests Normal   Final   Gram Stain     Final   Value: CYTOSPIN PREP     WBC PRESENT, PREDOMINANTLY MONONUCLEAR     NO ORGANISMS SEEN   Report Status 10/02/2013 FINAL   Final  MRSA PCR SCREENING     Status: None   Collection Time    10/02/13 11:18 PM      Result Value Ref Range Status   MRSA by PCR  NEGATIVE  NEGATIVE Final   Comment:            The GeneXpert MRSA Assay (FDA     approved for NASAL specimens     only), is one component of a     comprehensive MRSA colonization     surveillance program. It is not     intended to diagnose MRSA     infection nor to guide or     monitor treatment for     MRSA infections.  CLOSTRIDIUM DIFFICILE BY PCR     Status: None   Collection Time    10/05/13  2:05 AM      Result Value Ref Range Status   C difficile by pcr NEGATIVE  NEGATIVE Final  CULTURE, BLOOD (ROUTINE X 2)     Status: None   Collection Time    10/05/13 10:42 PM      Result Value Ref Range Status   Specimen Description BLOOD RIGHT UPPER ARM   Final   Special Requests BOTTLES DRAWN AEROBIC AND ANAEROBIC 10CC   Final   Culture  Setup Time     Final   Value: 10/06/2013 03:56  Performed at [facility]     Final   Value:        BLOOD CULTURE RECEIVED NO GROWTH TO DATE CULTURE WILL BE HELD FOR 5 DAYS BEFORE ISSUING A FINAL NEGATIVE REPORT     Performed at [facility]   Report Status PENDING   Incomplete  CULTURE, BLOOD (ROUTINE X 2)     Status: None   Collection Time    10/05/13 10:56 PM      Result Value Ref Range Status   Specimen Description BLOOD RIGHT FOREARM   Final   Special Requests BOTTLES DRAWN AEROBIC AND ANAEROBIC 10CC   Final   Culture  Setup Time     Final   Value: 10/06/2013 03:56     Performed at [facility]   Culture     Final   Value:        BLOOD CULTURE RECEIVED NO GROWTH TO DATE CULTURE WILL BE HELD FOR 5 DAYS BEFORE ISSUING A FINAL NEGATIVE REPORT     Performed at [facility]   Report Status PENDING   Incomplete     Studies: Mr Tibia Fibula Right Wo Contrast  10/07/2013   CLINICAL DATA:  Nonhealing soft tissue ulceration stump of the right lower leg. Pain.  EXAM: MRI OF LOWER RIGHT EXTREMITY WITHOUT CONTRAST  TECHNIQUE: Multiplanar, multisequence MR imaging was performed. No intravenous contrast was  administered.  COMPARISON:  None.  FINDINGS: There is a focal fluid collection measuring 4.8 x 3.1 x 1.6 cm around the stump of the right tibia with edema in the distal 12 mm of the tibial stump. There is no discrete bone destruction. There is also a tiny amount of fluid around the stump of the right fibula and also slight edema in the distal fibula.  There is slight edema in the adjacent muscles around at the tip of the stump.  There is only a small right knee effusion, similar to the left.  IMPRESSION: Findings consistent with a focal abscess at the tip of the stumps of the distal tibia and fibula.  Subtle edema at the tip of the stumps of the tibia and fibula most likely represent focal osteomyelitis because the fluid collections, which probably represent pus, are immediately adjacent to the bones. but there is no discrete bone destruction.  Probable adjacent myositis/cellulitis.   Electronically Signed   By: Rozetta Nunnery M.D.   On: 10/07/2013 10:17    Scheduled Meds: . collagenase   Topical Daily  . feeding supplement (NEPRO CARB STEADY)  237 mL Oral BID BM  . heparin  5,000 Units Subcutaneous 3 times per day  . insulin aspart  0-20 Units Subcutaneous 6 times per day  . labetalol  300 mg Oral BID  . neomycin-bacitracin-polymyxin   Topical TID  . piperacillin-tazobactam (ZOSYN)  IV  2.25 g Intravenous 3 times per day  . sodium chloride  3 mL Intravenous Q12H  . vancomycin  750 mg Intravenous Q T,Th,Sa-HD   Continuous Infusions: . dextrose 5 % and 0.9% NaCl 50 mL/hr at 10/06/13 2005    Active Problems:   HYPERTENSION   HEPATITIS C, HX OF   CEREBROVASCULAR ACCIDENT, HX OF   BENIGN PROSTATIC HYPERTROPHY   Hyperlipidemia   Anemia due to chronic illness   DM (diabetes mellitus) type I controlled with renal manifestation   Chronic combined systolic (EF [id number]) and grade 2diastolic congestive heart failure   FTT (failure to thrive) in adult  Encephalopathy, metabolic   Dehydration    Fever    Time spent: 60 minutes    Syretta Kochel, J  [facility] Pager [phone]. If 7PM-7AM, please contact night-coverage at [email or web address], password [facility] 10/07/2013, 2:18 PM  LOS: 6 days

## 2013-10-07 NOTE — Progress Notes (Signed)
  [facility] Progress Note   Subjective: Responds to voice by awakening, will not answer questions, falls back asleep  Filed Vitals:   10/07/13 0830 10/07/13 0900 10/07/13 0930 10/07/13 1000  BP: 111/85 104/66 124/81 130/80  Pulse: 87 86 86 91  Temp:      TempSrc:      Resp:      Height:      Weight:      SpO2:       Exam: Opens eyes to voice and very minimal voice response  No jvd  Chest minimal respiratory effort and CTA bilat  RRR no MRG  ABd BS POS , soft, Non tender and non distended scaphoid, no ascites  Bilat LE BKAS,R bka ace wrap not removed /Left stump old surg scabs no edema  LUA fistula Pos bruit  CXR negative  ECHO 10/01/13 LV and RV normal fxn  Head CT negative   [facility]  4h 60min 72.5kg 2/2.0 [place] LUA AVF Heparin 5000 / 2400  Hectorol 1 Epo none Venofer none   Assessment:  1 Altered mental status- pt remains confused, would not give him narcotics > have d/c'd oxycodone 2 ESRD on hemodialysis 3 Fever- on vanc + aztreonam IV, cx's neg, fevers responding; MRI R leg pending 4 PVD / R BKA 2/13 / prior L BKA  5 DM on insulin  6 Volume- slight vol ^, UF 1kg w HD today  7 2HPT cont phoslo, hold vit D (^Ca)  8 Hep C  9 Hx CVA  10 Anemia Hb 12.5, no esa  11 FTT / debility- came from SNF  Plan- HD today, avoid any narcotics, use haldol for agitation, palliative care consulted and meeting w family is pending    Kelly Splinter MD  pager [phone]    cell [phone]  10/07/2013, 10:31 AM     Recent Labs Lab 10/04/13 0429 10/05/13 0749 10/06/13 0620 10/07/13 0825  NA 135* 132* 139 139  K 4.2 3.9 4.4 4.6  CL 88* 87* 95* 95*  CO2 28 24 27 27   GLUCOSE 174* 222* 174* 184*  BUN 44* 61* 29* 43*  CREATININE 5.80* 7.02* 4.36* 6.10*  CALCIUM 10.5 10.2 9.9 9.6  PHOS 5.1*  --   --   --     Recent Labs Lab 10/05/13 0749 10/06/13 0620 10/07/13 0825  AST 47* 32 23  ALT 34 25 18  ALKPHOS 66 60 60  BILITOT 0.5 0.4 0.3  PROT 8.5*  8.1 7.5  ALBUMIN 2.7* 2.9* 2.7*    Recent Labs Lab 10/05/13 0749 10/06/13 0620 10/07/13 0825  WBC 8.8 8.5 9.2  NEUTROABS 5.8 5.5 5.8  HGB 12.6* 11.3* 10.6*  HCT 38.2* 34.7* 32.1*  MCV 82.9 84.2 83.2  PLT 310 310 342   . aztreonam  1 g Intravenous Q24H  . collagenase   Topical Daily  . feeding supplement (NEPRO CARB STEADY)  237 mL Oral BID BM  . heparin  5,000 Units Subcutaneous 3 times per day  . insulin aspart  0-20 Units Subcutaneous 6 times per day  . labetalol  300 mg Oral BID  . neomycin-bacitracin-polymyxin   Topical TID  . sodium chloride  3 mL Intravenous Q12H  . vancomycin  750 mg Intravenous Q T,Th,Sa-HD   . dextrose 5 % and 0.9% NaCl 50 mL/hr at 10/06/13 2005   acetaminophen, oxyCODONE

## 2013-10-07 NOTE — Progress Notes (Signed)
ANTIBIOTIC CONSULT NOTE - Follow-up  Pharmacy Consult for Vancomycin and Zosyn Indication: Right BKA abscess/osteomylitis  No Known Allergies  Patient Measurements: Height: 4\' 8"  (142.2 cm) Weight: 158 lb 11.7 oz (72 kg) IBW/kg (Calculated) : 40.8  Recent Labs  10/05/13 0749 10/06/13 0620 10/07/13 0825  WBC 8.8 8.5 9.2  HGB 12.6* 11.3* 10.6*  PLT 310 310 342  CREATININE 7.02* 4.36* 6.10*   Estimated Creatinine Clearance: 9.5 ml/min (by C-G formula based on Cr of 6.1). No results found for this basename: VANCOTROUGH, Corlis Leak, VANCORANDOM, GENTTROUGH, GENTPEAK, GENTRANDOM, TOBRATROUGH, TOBRAPEAK, TOBRARND, AMIKACINPEAK, AMIKACINTROU, AMIKACIN,  in the last 72 hours   Microbiology: Recent Results (from the past 720 hour(s))  CULTURE, BLOOD (ROUTINE X 2)     Status: None   Collection Time    10/01/13 11:35 AM      Result Value Ref Range Status   Specimen Description BLOOD RIGHT WRIST   Final   Special Requests BOTTLES DRAWN AEROBIC ONLY 10CC   Final   Culture  Setup Time     Final   Value: 10/01/2013 19:00     Performed at [facility]   Culture     Final   Value: NO GROWTH 5 DAYS     Performed at [facility]   Report Status 10/07/2013 FINAL   Final  CULTURE, BLOOD (ROUTINE X 2)     Status: None   Collection Time    10/01/13 12:01 PM      Result Value Ref Range Status   Specimen Description BLOOD RIGHT FOREARM   Final   Special Requests BOTTLES DRAWN AEROBIC AND ANAEROBIC 5CC   Final   Culture  Setup Time     Final   Value: 10/01/2013 18:59     Performed at [facility]   Culture     Final   Value: NO GROWTH 5 DAYS     Performed at [facility]   Report Status 10/07/2013 FINAL   Final  CSF CULTURE     Status: None   Collection Time    10/02/13 10:55 AM      Result Value Ref Range Status   Specimen Description CSF   Final   Special Requests Normal   Final   Gram Stain     Final   Value: FEW WBC PRESENT, PREDOMINANTLY  MONONUCLEAR     NO ORGANISMS SEEN     Performed at [facility]   Culture     Final   Value: NO GROWTH 3 DAYS     Performed at [facility]   Report Status 10/06/2013 FINAL   Final  GRAM STAIN     Status: None   Collection Time    10/02/13 10:55 AM      Result Value Ref Range Status   Specimen Description CSF   Final   Special Requests Normal   Final   Gram Stain     Final   Value: CYTOSPIN PREP     WBC PRESENT, PREDOMINANTLY MONONUCLEAR     NO ORGANISMS SEEN   Report Status 10/02/2013 FINAL   Final  MRSA PCR SCREENING     Status: None   Collection Time    10/02/13 11:18 PM      Result Value Ref Range Status   MRSA by PCR NEGATIVE  NEGATIVE Final   Comment:            The GeneXpert MRSA Assay (FDA  approved for NASAL specimens     only), is one component of a     comprehensive MRSA colonization     surveillance program. It is not     intended to diagnose MRSA     infection nor to guide or     monitor treatment for     MRSA infections.  CLOSTRIDIUM DIFFICILE BY PCR     Status: None   Collection Time    10/05/13  2:05 AM      Result Value Ref Range Status   C difficile by pcr NEGATIVE  NEGATIVE Final  CULTURE, BLOOD (ROUTINE X 2)     Status: None   Collection Time    10/05/13 10:42 PM      Result Value Ref Range Status   Specimen Description BLOOD RIGHT UPPER ARM   Final   Special Requests BOTTLES DRAWN AEROBIC AND ANAEROBIC 10CC   Final   Culture  Setup Time     Final   Value: 10/06/2013 03:56     Performed at [facility]   Culture     Final   Value:        BLOOD CULTURE RECEIVED NO GROWTH TO DATE CULTURE WILL BE HELD FOR 5 DAYS BEFORE ISSUING A FINAL NEGATIVE REPORT     Performed at [facility]   Report Status PENDING   Incomplete  CULTURE, BLOOD (ROUTINE X 2)     Status: None   Collection Time    10/05/13 10:56 PM      Result Value Ref Range Status   Specimen Description BLOOD RIGHT FOREARM   Final   Special Requests  BOTTLES DRAWN AEROBIC AND ANAEROBIC 10CC   Final   Culture  Setup Time     Final   Value: 10/06/2013 03:56     Performed at [facility]   Culture     Final   Value:        BLOOD CULTURE RECEIVED NO GROWTH TO DATE CULTURE WILL BE HELD FOR 5 DAYS BEFORE ISSUING A FINAL NEGATIVE REPORT     Performed at [facility]   Report Status PENDING   Incomplete    Assessment: 63 year old male from NH with recent admit in Feb 2015 for R gangrenous foot. Admitted for AMS and initially initiated on broad-spectrum antibiotics for possible HCAP and stump wound infxn (Vanc/Aztreonam?). MRI consistent for focal abscess/osteomylitis. Abx changed to Vanc/Zosyn  Aztreo 3/8>>3/14 Vanc 3/8>> Zosyn 3/14  3/9 CSF - NGTD 3/9 MRSA - NEG 3/8 Blood - NGTD  Goal of Therapy:  Pre-HD Vancomycin level 15-25 mcg/ml  Plan:  Stop Aztreonam, start Zosyn 2.25 g IV q8h Continue vanc 750mg  IV post-HD F/u renal plans, C&S, clinical status, will consider pre-HD level when appropriate F/u surgical debridement assessment  Makaylynn Bonillas B. Leitha Schuller, PharmD Clinical Pharmacist - Resident Phone: [phone] Pager: [phone] 10/07/2013 2:11 PM

## 2013-10-08 LAB — CBC WITH DIFFERENTIAL/PLATELET
BASOS PCT: 0 % (ref 0–1)
Basophils Absolute: 0 10*3/uL (ref 0.0–0.1)
EOS PCT: 3 % (ref 0–5)
Eosinophils Absolute: 0.2 10*3/uL (ref 0.0–0.7)
HEMATOCRIT: 34.4 % — AB (ref 39.0–52.0)
Hemoglobin: 11 g/dL — ABNORMAL LOW (ref 13.0–17.0)
Lymphocytes Relative: 27 % (ref 12–46)
Lymphs Abs: 1.9 10*3/uL (ref 0.7–4.0)
MCH: 27.5 pg (ref 26.0–34.0)
MCHC: 32 g/dL (ref 30.0–36.0)
MCV: 86 fL (ref 78.0–100.0)
MONOS PCT: 11 % (ref 3–12)
Monocytes Absolute: 0.8 10*3/uL (ref 0.1–1.0)
NEUTROS ABS: 4.1 10*3/uL (ref 1.7–7.7)
Neutrophils Relative %: 59 % (ref 43–77)
Platelets: 330 10*3/uL (ref 150–400)
RBC: 4 MIL/uL — ABNORMAL LOW (ref 4.22–5.81)
RDW: 19 % — ABNORMAL HIGH (ref 11.5–15.5)
WBC: 7 10*3/uL (ref 4.0–10.5)

## 2013-10-08 LAB — GLUCOSE, CAPILLARY
GLUCOSE-CAPILLARY: 126 mg/dL — AB (ref 70–99)
GLUCOSE-CAPILLARY: 131 mg/dL — AB (ref 70–99)
Glucose-Capillary: 175 mg/dL — ABNORMAL HIGH (ref 70–99)
Glucose-Capillary: 137 mg/dL — ABNORMAL HIGH (ref 70–99)
Glucose-Capillary: 99 mg/dL (ref 70–99)
Glucose-Capillary: 134 mg/dL — ABNORMAL HIGH (ref 70–99)
Glucose-Capillary: 113 mg/dL — ABNORMAL HIGH (ref 70–99)

## 2013-10-08 LAB — COMPREHENSIVE METABOLIC PANEL
ALT: 18 U/L (ref 0–53)
AST: 33 U/L (ref 0–37)
Albumin: 2.6 g/dL — ABNORMAL LOW (ref 3.5–5.2)
Alkaline Phosphatase: 58 U/L (ref 39–117)
BUN: 21 mg/dL (ref 6–23)
CO2: 27 mEq/L (ref 19–32)
Calcium: 9.3 mg/dL (ref 8.4–10.5)
Chloride: 97 mEq/L (ref 96–112)
Creatinine, Ser: 3.84 mg/dL — ABNORMAL HIGH (ref 0.50–1.35)
GFR calc non Af Amer: 15 mL/min — ABNORMAL LOW (ref >90)
GFR, EST AFRICAN AMERICAN: 18 mL/min — AB (ref >90)
Glucose, Bld: 124 mg/dL — ABNORMAL HIGH (ref 70–99)
POTASSIUM: 4.6 meq/L (ref 3.7–5.3)
SODIUM: 140 meq/L (ref 137–147)
TOTAL PROTEIN: 7.7 g/dL (ref 6.0–8.3)
Total Bilirubin: 0.4 mg/dL (ref 0.3–1.2)

## 2013-10-08 LAB — MAGNESIUM: Magnesium: 2.1 mg/dL (ref 1.5–2.5)

## 2013-10-08 MED ORDER — HYDRALAZINE HCL 20 MG/ML IJ SOLN
5.0000 mg | INTRAMUSCULAR | Status: DC | PRN
  Administered 2013-10-12: 5 mg via INTRAVENOUS
  Filled 2013-10-08: qty 1

## 2013-10-08 NOTE — Progress Notes (Signed)
Per patient, he was lying in bed and turned from his right side to his left side. His stumps went between the side rails and he slid out of the bed.  Patient was in a low air bed that was in the lowest position with foam pads on each side of the bed (on the floor). Patient stated he was fine and did not hit his head. No apparent sign of injury. No bruising,cuts or swelling. Dr.Woods notified approx. 1330. VS: 97.7-85-18-180/91. Per patient request, familly was not notified. Patient stated he would tell them when they came to visit.

## 2013-10-08 NOTE — Progress Notes (Signed)
Patient's wife arrived(Esther) ,explained to her those seizure pads tucked on either side of the bed,and what happened to her husband earlier,also explained to her the reason why we did not immediately informed her about the fall episode.Spouse was surprised when she noticed that her spouse is more alert at this time and able to re call my name.Spouse understand the reason for not informing her as per her spouse requested and appreciated the staffs the plan of care her spouse getting from the staffs.

## 2013-10-08 NOTE — Progress Notes (Signed)
Patient ID: BASIM BARTNIK, male   DOB: [birth year], 63 y.o.   MRN: [id number] I examined Mr. Moris right BKA stump today and reviewed the MRI.  The end of his stump has fibrotic tissue with some breakdown, but no erythema and I could not express anything from the end.  The MRI findings could also represent post-operative changes as well.  He fortunately does not need an urgent surgery, but may need a revision of the end of the stump due to the breakdown.  He continues to be afebrile with a normal WBC count.  I will have Dr. Sharol Given re-assess tomorrow.

## 2013-10-08 NOTE — Progress Notes (Signed)
[facility] PROGRESS NOTE  Oscar Castillo [name] DOB: 1951-02-22 DOA: 10/01/2013 PCP: [name], MD  Assessment/Plan:  Encephalopathy, metabolic  -much more alert today- answering questions easily- remains confused  -Suspect dehydration and possible underlying infectious processes as etiology; 3/13 patient's cognition appears to be clearing. Patient eating and answering simple questions  -Patient also expressing the severity of the right BKA stump pain.  New LBBB  -has been transient in nature  -systolic function normal but septal motion with abnormal function and dyssynergy  -cardiac enzymes negative  -doubt pt appropriate for invasive cardiac work up   Dehydration /FTT (failure to thrive) in adult  -Continue IV fluid-type of fluid adjusted by nephrology today  - Speech and Language Pathologist (SLP) attempted 10/02/2013 the patient unable to arouse enough to eval but later re examined and now on D1 diet  -Patient has begun to eat his meals., However very cachectic will add ensure 3 times a day between meals.  -IV to KVO except when giving meds    Fever  -Unclear etiology- suspect DH and atelectasis  -question of right lower lobe right but currently is not hypoxic  -He did have leukocytosis so continue empiric antibiotics as precaution  -Patient unable to contribute to history and given altered mentation and fever an LP was done to rule out possible meningitis-CSF studies pending-partial results with 35 of protein and 156 of glucose so not indicative of bacterial meningitis-CSF culture no growth  -Influenza PCR negative  -Medication reconciliation list that patient was on doxycycline prior to admission which was started on 09/29/2013 or suspected postoperative wound infection/possible osteomyelitis and scheduled to go for 3 weeks  -Blood cultures no growth to date urinalysis unremarkable  -3/14 Right BKA stump shows abscess/osteomyelitis by MRI;; see results below  -3/15  patient remains afebrile    HYPERTENSION  -Blood pressure controlled  -Continue labetalol 300 mg BID  -Start hydralazine 5 mg PRN SBP> 160 or DBP> 100  Anemia due to chronic illness  -Hemoglobin has trended down to 13 after hydration noting baseline just under 11  -Monitor closely currently at 11  DM (diabetes mellitus) type I controlled with renal manifestation  -On Lantus prior to admission, however patient still not eating well.   -Continue resistant SSI; CBG currently controlled on this regimen   CKD (chronic kidney disease) stage V requiring chronic dialysis  -Usual dialysis days Tuesday Thursday Saturday  -No ultrafiltration secondary to ongoing dehydration   Chronic combined systolic (EF [id number]) and grade 2 diastolic congestive heart failure  -Compensated and managed with dialysis on T/Th/Sat  HEPATITIS C, HX OF   CEREBROVASCULAR ACCIDENT, HX OF -No evidence of acute infarct   Hyperlipidemia -lipid panel; w/i NCEP guidelines  Right BKA -Pain is increasing since surgery in February 2015. MRI 3/13 shows pathology see results below -3/14 MRI of right BKA stump shows abscess/osteomyelitis see results below -3/15 stop much less tender today, negative discharge, fibrotic tissue along the skin closure.    Osteomyelitis right BKA stump -Continue  Zosyn + vancomycin -3/15 seen by Dr. Jean Rosenthal (orthopedic surgery), round discuss abnormal findings of MRI right BKA stump and decide on course of action on 3/16 (debridement, stump revision?) -Continue morphine 1-2 mg q. 4 hours PRN severe pain in right BKA stump.   Code Status: Full Family Communication:  Wife and sister present for discussion of plan of care  Disposition Plan: We will await recommendations from palliative care   Consultants: Dr. Roney Jaffe Nephrology  Dr  Meridee Score (Orthopedic Surgery) Dr. Jean Rosenthal (orthopedic surgery) Dr. Rhea Pink (palliative care) Radiology for lumbar  puncture   Procedures: MRI tib-fib right BKA stump 10/06/2013 Findings consistent with a focal abscess at the tip of the stumps of  the distal tibia and fibula.  Subtle edema at the tip of the stumps of the tibia and fibula most  likely represent focal osteomyelitis because the fluid collections,  which probably represent pus, are immediately adjacent to the bones.  but there is no discrete bone destruction.  Probable adjacent myositis/cellulitis.   CT head without contrast 10/01/2013 1. There is no evidence of an acute intracranial hemorrhage nor of  an evolving ischemic infarction.  2. There is stable diffuse atrophy with evidence of chronic small  vessel ischemic change.  3. There is no intracranial mass effect.  4. There is no evidence of an acute skull fracture.   Lumbar puncture by interventional radiology   Antibiotics: Zosyn 3/14>> Vancomycin (full dose) 3/14>> Aztreonam 3/08 >>> stopped 3/14 Vancomycin 3/08 >>> stopped 3/14      HPI/Subjective: 63 year old male PMHx HTN, HLD, Hx diabetes type 1 controlled, vasculopath, S/P recent right BKA on 08/2013. He also has a prior history of CVA in 2009 and is on chronic hemodialysis. In addition he has combined systolic& diastolic heart failure with an EF of 45%. According to the patient's wife he had been doing well in the postoperative period until around the same time he followed up with Dr. Sharol Given. At that time he started with poor intake and gradually increasing altered mentation. 09/24/2013 he was very confused when not work with the therapist. On 09/26/2013 a meeting was called at the nursing facility because he was experiencing extreme symptoms of failure to thrive and they were concerned the patient was depressed. Palliative care team had apparently been meeting with the family prior to admission. By 09/29/2013 he was a little more coherent and began trying to eat but later in the morning he would not take his meds kept his  mouth shut.  Postoperatively he has had 2 courses of antibiotics for concerns of possible stump infection. In regards to dietary intake: in addition to the above the patient has been on a dysphagia diet with pured foods begun after the patient was noted to be coughing with attempts to eat.  Upon presentation to the emergency department EMS reported that the wife found food in the patient's mouth and he was unresponsive. His CBG was 379. He was hypoxemic and placed on a nonrebreather mask. His hemoglobin at presentation was 16.7 which was markedly elevated compared to prior readings between 9.9 and 11.5. He was also noted to have significant decreased breath sounds on the right without wheezing or crackles. The right BKA stump was unremarkable in appearance. Was also found to be mildly febrile with a rectal temperature of 101. Chest x-ray was concerning for possible right lower lobe pneumonia. 3/12 patient just returned from HD somewhat lethargic, complained of right BKA stump pain which has been increasing over several days 3/13 patient interacting with medical staff appropriately, able to participate in dressing change of right BKA 3/14 patient alert speaking in full sentences, sitting in bed eating lunch. Writhing in pain and indicating pain is in his right BKA stump. 3/15 patient states the pain in his right BKA stump significantly decreased on the pain regimen he is on now. Negative N./V. states has been eating his meals today. States understands he has an infection in the right BKA  stump and further surgery may be required.    Objective: Filed Vitals:   10/08/13 0347 10/08/13 0857 10/08/13 1254 10/08/13 1322  BP: 109/65 144/79 144/77 180/91  Pulse: 77 95 95 85  Temp: 97.7 F (36.5 C) 97.8 F (36.6 C) 98.5 F (36.9 C) 97.9 F (36.6 C)  TempSrc: Oral Oral Oral   Resp: 18 18 18 17   Height:      Weight:      SpO2: 100% 100% 96% 100%    Intake/Output Summary (Last 24 hours) at 10/08/13  1436 Last data filed at 10/08/13 0941  Gross per 24 hour  Intake   1120 ml  Output      0 ml  Net   1120 ml   Filed Weights   10/07/13 0731 10/07/13 1203 10/07/13 1955  Weight: 73 kg (160 lb 15 oz) 72 kg (158 lb 11.7 oz) 83 kg (182 lb 15.7 oz)    Exam:   General:  Alert, follows all commands, mild discomfort secondary to right BKA stump pain   Cardiovascular: Regular rhythm and rate, negative murmurs rubs gallop  Respiratory: Clear to auscultation bilateral  Abdomen: Soft, nontender, nondistended plus bowel sound  Musculoskeletal: Bilateral BKA, left BKA performed November 2014 still remains sore to palpation; right BKA performed February 2015 mildly tender to palpation along the anterior/right lateral aspect of stump (patient on pain regimen currently). Negative discharge from right stump, however warm to touch      Data Reviewed: Basic Metabolic Panel:  Recent Labs Lab 10/04/13 0429 10/05/13 0749 10/06/13 0620 10/07/13 0825 10/08/13 0605  NA 135* 132* 139 139 140  K 4.2 3.9 4.4 4.6 4.6  CL 88* 87* 95* 95* 97  CO2 28 24 27 27 27   GLUCOSE 174* 222* 174* 184* 124*  BUN 44* 61* 29* 43* 21  CREATININE 5.80* 7.02* 4.36* 6.10* 3.84*  CALCIUM 10.5 10.2 9.9 9.6 9.3  MG  --  2.3 2.1 2.1 2.1  PHOS 5.1*  --   --   --   --    Liver Function Tests:  Recent Labs Lab 10/02/13 0255 10/04/13 0429 10/05/13 0749 10/06/13 0620 10/07/13 0825 10/08/13 0605  AST 30  --  47* 32 23 33  ALT 12  --  34 25 18 18   ALKPHOS 79  --  66 60 60 58  BILITOT 0.5  --  0.5 0.4 0.3 0.4  PROT 9.5*  --  8.5* 8.1 7.5 7.7  ALBUMIN 3.1* 3.1* 2.7* 2.9* 2.7* 2.6*   No results found for this basename: LIPASE, AMYLASE,  in the last 168 hours No results found for this basename: AMMONIA,  in the last 168 hours CBC:  Recent Labs Lab 10/04/13 0429 10/05/13 0749 10/06/13 0620 10/07/13 0825 10/08/13 0605  WBC 11.0* 8.8 8.5 9.2 7.0  NEUTROABS  --  5.8 5.5 5.8 4.1  HGB 12.5* 12.6* 11.3* 10.6*  11.0*  HCT 37.5* 38.2* 34.7* 32.1* 34.4*  MCV 84.7 82.9 84.2 83.2 86.0  PLT 308 310 310 342 330   Cardiac Enzymes:  Recent Labs Lab 10/02/13 1630 10/02/13 2151 10/03/13 0304  CKTOTAL 351* 339* 345*  CKMB 2.2 2.5 2.9  TROPONINI <0.30 <0.30 <0.30   BNP (last 3 results)  Recent Labs  10/01/13 1135  PROBNP 1397.0*   CBG:  Recent Labs Lab 10/07/13 1948 10/07/13 2325 10/08/13 0343 10/08/13 0805 10/08/13 1238  GLUCAP 175* 137* 99 126* 134*    Recent Results (from the past 240 hour(s))  CULTURE,  BLOOD (ROUTINE X 2)     Status: None   Collection Time    10/01/13 11:35 AM      Result Value Ref Range Status   Specimen Description BLOOD RIGHT WRIST   Final   Special Requests BOTTLES DRAWN AEROBIC ONLY 10CC   Final   Culture  Setup Time     Final   Value: 10/01/2013 19:00     Performed at [facility]   Culture     Final   Value: NO GROWTH 5 DAYS     Performed at [facility]   Report Status 10/07/2013 FINAL   Final  CULTURE, BLOOD (ROUTINE X 2)     Status: None   Collection Time    10/01/13 12:01 PM      Result Value Ref Range Status   Specimen Description BLOOD RIGHT FOREARM   Final   Special Requests BOTTLES DRAWN AEROBIC AND ANAEROBIC 5CC   Final   Culture  Setup Time     Final   Value: 10/01/2013 18:59     Performed at [facility]   Culture     Final   Value: NO GROWTH 5 DAYS     Performed at [facility]   Report Status 10/07/2013 FINAL   Final  CSF CULTURE     Status: None   Collection Time    10/02/13 10:55 AM      Result Value Ref Range Status   Specimen Description CSF   Final   Special Requests Normal   Final   Gram Stain     Final   Value: FEW WBC PRESENT, PREDOMINANTLY MONONUCLEAR     NO ORGANISMS SEEN     Performed at [facility]   Culture     Final   Value: NO GROWTH 3 DAYS     Performed at [facility]   Report Status 10/06/2013 FINAL   Final  GRAM STAIN     Status: None   Collection Time     10/02/13 10:55 AM      Result Value Ref Range Status   Specimen Description CSF   Final   Special Requests Normal   Final   Gram Stain     Final   Value: CYTOSPIN PREP     WBC PRESENT, PREDOMINANTLY MONONUCLEAR     NO ORGANISMS SEEN   Report Status 10/02/2013 FINAL   Final  MRSA PCR SCREENING     Status: None   Collection Time    10/02/13 11:18 PM      Result Value Ref Range Status   MRSA by PCR NEGATIVE  NEGATIVE Final   Comment:            The GeneXpert MRSA Assay (FDA     approved for NASAL specimens     only), is one component of a     comprehensive MRSA colonization     surveillance program. It is not     intended to diagnose MRSA     infection nor to guide or     monitor treatment for     MRSA infections.  CLOSTRIDIUM DIFFICILE BY PCR     Status: None   Collection Time    10/05/13  2:05 AM      Result Value Ref Range Status   C difficile by pcr NEGATIVE  NEGATIVE Final  CULTURE, BLOOD (ROUTINE X 2)     Status: None   Collection Time  10/05/13 10:42 PM      Result Value Ref Range Status   Specimen Description BLOOD RIGHT UPPER ARM   Final   Special Requests BOTTLES DRAWN AEROBIC AND ANAEROBIC 10CC   Final   Culture  Setup Time     Final   Value: 10/06/2013 03:56     Performed at [facility]   Culture     Final   Value:        BLOOD CULTURE RECEIVED NO GROWTH TO DATE CULTURE WILL BE HELD FOR 5 DAYS BEFORE ISSUING A FINAL NEGATIVE REPORT     Performed at [facility]   Report Status PENDING   Incomplete  CULTURE, BLOOD (ROUTINE X 2)     Status: None   Collection Time    10/05/13 10:56 PM      Result Value Ref Range Status   Specimen Description BLOOD RIGHT FOREARM   Final   Special Requests BOTTLES DRAWN AEROBIC AND ANAEROBIC 10CC   Final   Culture  Setup Time     Final   Value: 10/06/2013 03:56     Performed at [facility]   Culture     Final   Value:        BLOOD CULTURE RECEIVED NO GROWTH TO DATE CULTURE WILL BE HELD FOR 5 DAYS  BEFORE ISSUING A FINAL NEGATIVE REPORT     Performed at [facility]   Report Status PENDING   Incomplete     Studies: Mr Tibia Fibula Right Wo Contrast  10/07/2013   CLINICAL DATA:  Nonhealing soft tissue ulceration stump of the right lower leg. Pain.  EXAM: MRI OF LOWER RIGHT EXTREMITY WITHOUT CONTRAST  TECHNIQUE: Multiplanar, multisequence MR imaging was performed. No intravenous contrast was administered.  COMPARISON:  None.  FINDINGS: There is a focal fluid collection measuring 4.8 x 3.1 x 1.6 cm around the stump of the right tibia with edema in the distal 12 mm of the tibial stump. There is no discrete bone destruction. There is also a tiny amount of fluid around the stump of the right fibula and also slight edema in the distal fibula.  There is slight edema in the adjacent muscles around at the tip of the stump.  There is only a small right knee effusion, similar to the left.  IMPRESSION: Findings consistent with a focal abscess at the tip of the stumps of the distal tibia and fibula.  Subtle edema at the tip of the stumps of the tibia and fibula most likely represent focal osteomyelitis because the fluid collections, which probably represent pus, are immediately adjacent to the bones. but there is no discrete bone destruction.  Probable adjacent myositis/cellulitis.   Electronically Signed   By: Geanie Cooley M.D.   On: 10/07/2013 10:17    Scheduled Meds: . collagenase   Topical Daily  . feeding supplement (NEPRO CARB STEADY)  237 mL Oral BID BM  . heparin  5,000 Units Subcutaneous 3 times per day  . insulin aspart  0-20 Units Subcutaneous 6 times per day  . labetalol  300 mg Oral BID  . neomycin-bacitracin-polymyxin   Topical TID  . piperacillin-tazobactam (ZOSYN)  IV  2.25 g Intravenous 3 times per day  . sodium chloride  3 mL Intravenous Q12H  . vancomycin  750 mg Intravenous Q T,Th,Sa-HD   Continuous Infusions: . dextrose 5 % and 0.9% NaCl 50 mL/hr at 10/07/13 1603     Active Problems:   HYPERTENSION  HEPATITIS C, HX OF   CEREBROVASCULAR ACCIDENT, HX OF   BENIGN PROSTATIC HYPERTROPHY   Hyperlipidemia   Anemia due to chronic illness   DM (diabetes mellitus) type I controlled with renal manifestation   Chronic combined systolic (EF [id number]) and grade 2diastolic congestive heart failure   FTT (failure to thrive) in adult   Encephalopathy, metabolic   Dehydration   Fever    Time spent:40 minutes    Alaynah Schutter, J  [facility] Pager [phone]. If 7PM-7AM, please contact night-coverage at [email or web address], password [facility] 10/08/2013, 2:36 PM  LOS: 7 days

## 2013-10-08 NOTE — Progress Notes (Signed)
Around 1314,i was on room 6E26,just got a consented permit for procedure when heard a bed alarm,went out and checked and lead me to room 6E28,found patient siiting down on the mattress floor ,his back was leaning back on the bed,on the exact space between the siderail of the low bed.Patient was more awake at his time as compare from the previous days,verbally responsive,was telling that he was trying to change his side lying position,facing toward the windows and found self slid down from the bed,patient is bilaterally amputated which prone him to have a natural tendency to squeeze himself,on the space between the side rails on either side of the bed.Seizure pads was requested on the previous days but for no apparent reason,did not come until this time.Pillows and blanket stuffs on those blind space but no help.Called in the charge nurse to do the fall protocol,assessed patient and let M.D. Aware.On call came,to see and assessed patient.Documentations and Safety Zone report done as per charge nurse.Will monitor patient condition.

## 2013-10-08 NOTE — Progress Notes (Signed)
Subjective: asleep awoken and answers questions and pleasantly confused, No cos, yesterday confusion/aggitation on hd  tx with haldol and Ativan Objective Vital signs in last 24 hours: Filed Vitals:   10/07/13 1649 10/07/13 1955 10/07/13 2323 10/08/13 0347  BP: 152/81 151/91 136/79 109/65  Pulse: 88 93 81 77  Temp: 98.9 F (37.2 C) 100.1 F (37.8 C) 98.9 F (37.2 C) 97.7 F (36.5 C)  TempSrc:  Oral Oral Oral  Resp: 18 18 18 18   Height:      Weight:  83 kg (182 lb 15.7 oz)    SpO2: 100% 100% 100% 100%  Exam:  Alert after awoken from sleep and confused X3  And In NAD Chest minimal respiratory effort and CTA bilat  RRR no MRG  ABd BS POS ,scaphoid, soft, Non tender and non distended Bilat LE BKAS,R bka ace wrap clean  not removed /Left stump old surg scabs no edema  LUA fistula Pos bruit   CXR negative  ECHO 10/01/13 LV and RV normal fxn  Head CT negative   [facility]  4h 80min 72.5kg 2/2.0 [place] LUA AVF Heparin 5000 / 2400  Hectorol 1 Epo none Venofer none   Assessment:  1 Altered mental status- pt remains confused but more alert than on admission 2 ESRD on hemodialysis  3 Fever / R stump wound infection- on vanc + aztreonam IV, cx's neg, fevers responding; MRI R stump shows possible osteo, ortho evaluating 4 PVD / R BKA 2/13 / prior L BKA  5 DM on insulin  6 Volume- wt's inaccurate, prob mild vol excess now 7 2HPT cont phoslo, hold vit D (^Ca)  8 Hep C  9 Hx CVA  10 Anemia Hb 12.5, no esa  11 FTT / debility- came from SNF / [place] yest= "wife wants patient to make CODE decision when his mentation clears,""Disconnect between wifes'wishes and husbands request previously"", and wife wants to take patient home?? How she can transport him to hemodialysis, currently she is not in the room and pt confused this am   [place], PA-C [place] [phone] 10/08/2013,8:52 AM  LOS: 7 days   Pt seen, examined,  agree w assess/plan as above with additions as indicated.  Kelly Splinter MD pager [phone]    cell [phone] 10/08/2013, 1:22 PM      Labs: Basic Metabolic Panel:  Recent Labs Lab 10/04/13 0429  10/06/13 0620 10/07/13 0825 10/08/13 0605  NA 135*  < > 139 139 140  K 4.2  < > 4.4 4.6 4.6  CL 88*  < > 95* 95* 97  CO2 28  < > 27 27 27   GLUCOSE 174*  < > 174* 184* 124*  BUN 44*  < > 29* 43* 21  CREATININE 5.80*  < > 4.36* 6.10* 3.84*  CALCIUM 10.5  < > 9.9 9.6 9.3  PHOS 5.1*  --   --   --   --   < > = values in this interval not displayed. Liver Function Tests:  Recent Labs Lab 10/06/13 0620 10/07/13 0825 10/08/13 0605  AST 32 23 33  ALT 25 18 18   ALKPHOS 60 60 58  BILITOT 0.4 0.3 0.4  PROT 8.1 7.5 7.7  ALBUMIN 2.9* 2.7* 2.6*    Recent Labs Lab 10/01/13 1201  AMMONIA 29   CBC:  Recent Labs Lab 10/04/13 0429 10/05/13 0749 10/06/13 0620 10/07/13 0825 10/08/13 0605  WBC 11.0* 8.8 8.5  9.2 7.0  NEUTROABS  --  5.8 5.5 5.8 4.1  HGB 12.5* 12.6* 11.3* 10.6* 11.0*  HCT 37.5* 38.2* 34.7* 32.1* 34.4*  MCV 84.7 82.9 84.2 83.2 86.0  PLT 308 310 310 342 330   Cardiac Enzymes:  Recent Labs Lab 10/01/13 1135 10/02/13 1630 10/02/13 2151 10/03/13 0304  CKTOTAL  --  351* 339* 345*  CKMB  --  2.2 2.5 2.9  TROPONINI <0.30 <0.30 <0.30 <0.30   CBG:  Recent Labs Lab 10/07/13 1307 10/07/13 1657 10/07/13 1948 10/07/13 2325 10/08/13 0343  GLUCAP 160* 133* 175* 137* 99    Studies/Results: Mr Tibia Fibula Right Wo Contrast  10/07/2013   CLINICAL DATA:  Nonhealing soft tissue ulceration stump of the right lower leg. Pain.  EXAM: MRI OF LOWER RIGHT EXTREMITY WITHOUT CONTRAST  TECHNIQUE: Multiplanar, multisequence MR imaging was performed. No intravenous contrast was administered.  COMPARISON:  None.  FINDINGS: There is a focal fluid collection measuring 4.8 x 3.1 x 1.6 cm around the stump of the right tibia with edema in the distal 12 mm of the tibial stump. There  is no discrete bone destruction. There is also a tiny amount of fluid around the stump of the right fibula and also slight edema in the distal fibula.  There is slight edema in the adjacent muscles around at the tip of the stump.  There is only a small right knee effusion, similar to the left.  IMPRESSION: Findings consistent with a focal abscess at the tip of the stumps of the distal tibia and fibula.  Subtle edema at the tip of the stumps of the tibia and fibula most likely represent focal osteomyelitis because the fluid collections, which probably represent pus, are immediately adjacent to the bones. but there is no discrete bone destruction.  Probable adjacent myositis/cellulitis.   Electronically Signed   By: Rozetta Nunnery M.D.   On: 10/07/2013 10:17   Medications: . dextrose 5 % and 0.9% NaCl 50 mL/hr at 10/07/13 1603   . collagenase   Topical Daily  . feeding supplement (NEPRO CARB STEADY)  237 mL Oral BID BM  . heparin  5,000 Units Subcutaneous 3 times per day  . insulin aspart  0-20 Units Subcutaneous 6 times per day  . labetalol  300 mg Oral BID  . neomycin-bacitracin-polymyxin   Topical TID  . piperacillin-tazobactam (ZOSYN)  IV  2.25 g Intravenous 3 times per day  . sodium chloride  3 mL Intravenous Q12H  . vancomycin  750 mg Intravenous Q T,Th,Sa-HD

## 2013-10-09 ENCOUNTER — Other Ambulatory Visit (HOSPITAL_COMMUNITY): Payer: Self-pay | Admitting: Orthopedic Surgery

## 2013-10-09 LAB — COMPREHENSIVE METABOLIC PANEL
ALT: 15 U/L (ref 0–53)
AST: 21 U/L (ref 0–37)
Albumin: 2.3 g/dL — ABNORMAL LOW (ref 3.5–5.2)
Alkaline Phosphatase: 54 U/L (ref 39–117)
BUN: 30 mg/dL — ABNORMAL HIGH (ref 6–23)
CALCIUM: 9.2 mg/dL (ref 8.4–10.5)
CO2: 28 mEq/L (ref 19–32)
Chloride: 98 mEq/L (ref 96–112)
Creatinine, Ser: 5.34 mg/dL — ABNORMAL HIGH (ref 0.50–1.35)
GFR calc Af Amer: 12 mL/min — ABNORMAL LOW (ref >90)
GFR calc non Af Amer: 10 mL/min — ABNORMAL LOW (ref >90)
GLUCOSE: 109 mg/dL — AB (ref 70–99)
Potassium: 4.5 mEq/L (ref 3.7–5.3)
SODIUM: 141 meq/L (ref 137–147)
TOTAL PROTEIN: 7 g/dL (ref 6.0–8.3)
Total Bilirubin: 0.3 mg/dL (ref 0.3–1.2)

## 2013-10-09 LAB — CBC WITH DIFFERENTIAL/PLATELET
BASOS PCT: 0 % (ref 0–1)
Basophils Absolute: 0 10*3/uL (ref 0.0–0.1)
Eosinophils Absolute: 0.2 10*3/uL (ref 0.0–0.7)
Eosinophils Relative: 2 % (ref 0–5)
HEMATOCRIT: 30.5 % — AB (ref 39.0–52.0)
HEMOGLOBIN: 10 g/dL — AB (ref 13.0–17.0)
LYMPHS PCT: 27 % (ref 12–46)
Lymphs Abs: 2 10*3/uL (ref 0.7–4.0)
MCH: 27.7 pg (ref 26.0–34.0)
MCHC: 32.8 g/dL (ref 30.0–36.0)
MCV: 84.5 fL (ref 78.0–100.0)
MONO ABS: 0.6 10*3/uL (ref 0.1–1.0)
MONOS PCT: 9 % (ref 3–12)
Neutro Abs: 4.7 10*3/uL (ref 1.7–7.7)
Neutrophils Relative %: 62 % (ref 43–77)
Platelets: 348 10*3/uL (ref 150–400)
RBC: 3.61 MIL/uL — ABNORMAL LOW (ref 4.22–5.81)
RDW: 18.7 % — ABNORMAL HIGH (ref 11.5–15.5)
WBC: 7.6 10*3/uL (ref 4.0–10.5)

## 2013-10-09 LAB — GLUCOSE, CAPILLARY
GLUCOSE-CAPILLARY: 120 mg/dL — AB (ref 70–99)
GLUCOSE-CAPILLARY: 126 mg/dL — AB (ref 70–99)
GLUCOSE-CAPILLARY: 151 mg/dL — AB (ref 70–99)
GLUCOSE-CAPILLARY: 180 mg/dL — AB (ref 70–99)
GLUCOSE-CAPILLARY: 82 mg/dL (ref 70–99)
Glucose-Capillary: 135 mg/dL — ABNORMAL HIGH (ref 70–99)

## 2013-10-09 LAB — MAGNESIUM: Magnesium: 2 mg/dL (ref 1.5–2.5)

## 2013-10-09 LAB — HEMOGLOBIN AND HEMATOCRIT, BLOOD
HEMATOCRIT: 30.8 % — AB (ref 39.0–52.0)
Hemoglobin: 10.1 g/dL — ABNORMAL LOW (ref 13.0–17.0)

## 2013-10-09 MED ORDER — DARBEPOETIN ALFA-POLYSORBATE 25 MCG/0.42ML IJ SOLN
25.0000 ug | INTRAMUSCULAR | Status: DC
  Administered 2013-10-10 – 2013-10-24 (×3): 25 ug via INTRAVENOUS
  Filled 2013-10-09 (×4): qty 0.42

## 2013-10-09 MED ORDER — ONDANSETRON HCL 4 MG/2ML IJ SOLN
4.0000 mg | Freq: Three times a day (TID) | INTRAMUSCULAR | Status: DC
  Administered 2013-10-09 – 2013-10-31 (×60): 4 mg via INTRAVENOUS
  Filled 2013-10-09 (×56): qty 2

## 2013-10-09 MED ORDER — BISACODYL 5 MG PO TBEC
5.0000 mg | DELAYED_RELEASE_TABLET | Freq: Two times a day (BID) | ORAL | Status: DC
  Administered 2013-10-09 – 2013-10-31 (×9): 5 mg via ORAL
  Filled 2013-10-09 (×8): qty 1

## 2013-10-09 NOTE — Progress Notes (Signed)
Physical Therapy Treatment Patient Details Name: Oscar Castillo MRN: [id number] DOB: 30-Aug-1950 Today's Date: 10/09/2013 Time: 9323-5573 PT Time Calculation (min): 19 min  PT Assessment / Plan / Recommendation  History of Present Illness 63 y.o. male admitted to [facility] on 10/01/13 with AMS and FTT.  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 Of note, he has history of L BKA as well. Pt with significant PMHx of diabetes and severe peripheral vascular disease who also has end-stage renal disease and undergoes dialysis.  Planning for revision right transtibial amputation on Wednesday 3/18   PT Comments   Patient with limited treatment due to unsafe with edge of bed activity even with +2 assist scoots off edge and attempting to roll over and sit up on opposite side of bed.  Follow Up Recommendations  SNF;Supervision/Assistance - 24 hour     Does the patient have the potential to tolerate intense rehabilitation   No  Barriers to Discharge  None       Equipment Recommendations  Other (comment)    Recommendations for Other Services  None  Frequency Min 2X/week   Progress towards PT Goals Progress towards PT goals: Progressing toward goals  Plan Current plan remains appropriate    Precautions / Restrictions Precautions Precautions: Fall Precaution Comments: pt also with poor vision, wife reporting he has to have cataract surgery Restrictions Other Position/Activity Restrictions: bil BKA   Pertinent Vitals/Pain C/o pain generalized with mobility    Mobility  Bed Mobility Overal bed mobility: Needs Assistance Bed Mobility: Rolling;Sidelying to Sit;Sit to Sidelying Rolling: Min assist;Mod assist Sidelying to sit: +2 for safety/equipment;Mod assist Sit to sidelying: +2 for safety/equipment;Mod assist General bed mobility comments: Patient able to manage to move himself especially rolling in bed, but unsafe with decreased awareness and poor vision.  Needs cues and  close superviaion for safety and to prevent loss of balance off edge of bed    Exercises Other Exercises Other Exercises: rewrapped right BKA due to dressing falling off Other Exercises: moving legs in all directions throughout session and positioned on pillows at end of session     PT Goals (current goals can now be found in the care plan section) Acute Rehab PT Goals Patient Stated Goal: pt unable to state  Visit Information  Last PT Received On: 10/09/13 Assistance Needed: +2 History of Present Illness: 63 y.o. male admitted to [facility] on 10/01/13 with AMS and FTT.  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 Of note, he has history of L BKA as well. Pt with significant PMHx of diabetes and severe peripheral vascular disease who also has end-stage renal disease and undergoes dialysis.  Planning for revision right transtibial amputation on Wednesday 3/18    Subjective Data  Patient Stated Goal: pt unable to state   Cognition  Cognition Arousal/Alertness: Lethargic Behavior During Therapy: Flat affect Overall Cognitive Status: Impaired/Different from baseline Area of Impairment: Following commands;Awareness;Problem solving;Safety/judgement Following Commands: Follows one step commands inconsistently;Follows one step commands with increased time Safety/Judgement: Decreased awareness of safety;Decreased awareness of deficits Problem Solving: Slow processing Difficult to assess due to: Level of arousal    Balance  Balance Sitting-balance support: Bilateral upper extremity supported Sitting balance-Leahy Scale: Poor Sitting balance - Comments: rocking side to  side unsafely assist for balance and surface orientation  End of Session PT - End of Session Activity Tolerance: Treatment limited secondary to agitation Patient left: in bed;with call bell/phone within reach;with  bed alarm set   GP     [facility] 10/09/2013, 4:07 PM [place],  [place] 10/09/2013

## 2013-10-09 NOTE — Progress Notes (Addendum)
Speech Language Pathology Treatment: Dysphagia  Patient Details Name: Oscar Castillo MRN: [id number] DOB: 08/16/1950 Today's Date: 10/09/2013 Time: 9179-1505 SLP Time Calculation (min): 36 min  Assessment / Plan / Recommendation Clinical Impression  Pt. More alert than previously described, but remains confused and does not consistently f/c's.  Pt. Appears to be tolerating a pureed diet with thin liquids without overt s/s of aspiration.  Pt. Was given a small piece of canned peach and had difficulty chewing/forming a bolus.  Eventually pt. Swallowed the peach.  No problem noted with applesauce other than occasionally holding the bolus orally.  Pt. Swallows with verbal cue.   HPI HPI: 63 y.o. year-old with hx of HTN, DM2, PVD with bilat BKA, BPH, depression, and hep C.  Patient was here 2/13 - 09/11/13 for R BKA.  Discharged to SNF. According to H&P patient was confused and not working wt PT at the SNF and a palliative care meeting was held with family on March 3rd due to FTT and suspected depression.  Then he did a little better but yest am was found unresponsive.  Now with encephalopathy, FTT, dehydration, fever of unknown etiology.  Per notes, pt has been on pureed diet at SNF.    Pertinent Vitals Afebrile; BS diminished; CXR: NAD  SLP Plan  D/C ST due to all goals met. Continue Dysphagia 1 (pureed) diet.    Recommendations Diet recommendations: Dysphagia 1 (puree);Thin liquid Liquids provided via: Cup;Straw Medication Administration: Crushed with puree Supervision: Full supervision/cueing for compensatory strategies Compensations: Slow rate;Small sips/bites;Check for pocketing Postural Changes and/or Swallow Maneuvers: Seated upright 90 degrees              Oral Care Recommendations: Oral care BID Follow up Recommendations: Skilled Nursing facility Plan: Continue with current plan of care    GO     Quinn Axe T 10/09/2013, 3:17 PM

## 2013-10-09 NOTE — Evaluation (Signed)
Occupational Therapy Evaluation Patient Details Name: YOUNES DEGEORGE MRN: [id number] DOB: [birth year] Today's Date: 10/09/2013 Time: 2841-3244 OT Time Calculation (min): 25 min  OT Assessment / Plan / Recommendation History of present illness 63 y.o. male admitted to [facility] on 10/01/13 with AMS and FTT.  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 Of note, he has history of L BKA as well. Pt with significant PMHx of diabetes and severe peripheral vascular disease who also has end-stage renal disease and undergoes dialysis.  Planning for revision right transtibial amputation on Wednesday 3/18   Clinical Impression   Pt with significant weakness and limited ability to even use UEs in functional tasks such as self feeding and grooming. No family present to determine baseline for ADL. Pt states he didn't feed himself at home. Attempted to offer meal but pt unable to grip utensil and was also falling asleep during meal so covered tray and informed nursing tech. Will follow pt for trial of OT for family education and progress ADLs. Per chart, wife wants to take pt home but feel pt will need 24/7 at SNF at d/c.    OT Assessment  Patient needs continued OT Services (trial of OT)    Follow Up Recommendations  SNF;Supervision/Assistance - 24 hour    Barriers to Discharge      Equipment Recommendations  None recommended by OT    Recommendations for Other Services    Frequency  Min 2X/week    Precautions / Restrictions     Pertinent Vitals/Pain Pt groaning in pain; 8 on face scale. Nursing tech present and states she will inform nursing. Reposition.    ADL  Eating/Feeding: Performed;+1 Total assistance Where Assessed - Eating/Feeding: Bed level Grooming: Simulated;Wash/dry face;+1 Total assistance Where Assessed - Grooming: Supine, head of bed up ADL Comments: Currently pt is total assist to +2 total assist with all ADL tasks//functional mobility. Pt leaning significantly to  the R in the bed and slumped down. With tech assist, repositioned pt in bed to be more upright for feeding task. Pt unable to grip utensil with either hand to feed self. Pt states he did not feed himself at home but no family present to confirm PLOF. Pt starting to fall asleep after only one bite of food (attempted [facility] assist by OT) so covered tray and informed nursing tech. Bed alarm reactivated. Pt is profoundly weak and unable to use UEs to help reposition today.     OT Diagnosis: Generalized weakness;Acute pain  OT Problem List: Decreased strength;Decreased knowledge of use of DME or AE;Pain OT Treatment Interventions: Self-care/ADL training;DME and/or AE instruction;Therapeutic activities;Patient/family education   OT Goals(Current goals can be found in the care plan section) Acute Rehab OT Goals Patient Stated Goal: pt unable to state OT Goal Formulation: Patient unable to participate in goal setting Time For Goal Achievement: 10/23/13 Potential to Achieve Goals: Fair  Visit Information  Last OT Received On: 10/09/13 Assistance Needed: +2 History of Present Illness: 63 y.o. male admitted to [facility] on 10/01/13 with AMS and FTT.  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 Of note, he has history of L BKA as well. Pt with significant PMHx of diabetes and severe peripheral vascular disease who also has end-stage renal disease and undergoes dialysis.  Planning for revision right transtibial amputation on Wednesday 3/18       Prior Functioning     Home Living Family/patient expects to be discharged to:: Private  residence Living Arrangements: Spouse/significant other Available Help at Discharge: Family Type of Home: House Home Access: Level entry [place]: Two level (2 steps to get into the den) Alternate Level Stairs-Number of Steps: 2 Alternate Level Stairs-Rails: None Home Equipment: Walker - 2 wheels;Cane - single point;Bedside commode;Shower seat;Wheelchair -  manual;Hospital bed (urinal) Additional Comments: Wife reports that she has his hospital bed set up in the den which is a level entry.  He cannot get up into the rest of the house due to two stairs to get there from the den.  Wife is unable/not stong eoungh to bump him up in his WC.   Prior Function Level of Independence: Needs assistance Gait / Transfers Assistance Needed: prior to most recent amputation, he was able to scoot or pivot into w/c with wife's assist. Since R BKA, has not participated well with PT at SNF ADL's / Homemaking Assistance Needed: pt unable to report how much assist he needed with bathing/dressing/toileting. He states he couldnt feed himself at home. No family present to report on PLOF.  Communication Communication: Other (comment) (low volume; slow to respond) Dominant Hand: Right         Vision/Perception Vision - History Baseline Vision:  (unable to fully assess. pt lethargic)   Cognition  Cognition Arousal/Alertness: Lethargic Behavior During Therapy: Flat affect Overall Cognitive Status: Difficult to assess Difficult to assess due to: Level of arousal    Extremity/Trunk Assessment Upper Extremity Assessment Upper Extremity Assessment: RUE deficits/detail;LUE deficits/detail RUE Deficits / Details: Pt not lifting UE against gravity; when OT raised UE off the bed, pt could hold against gravity very briefly. Pt unable to flex digits/make a fist to hold the utensil. Pt stiff with AAROM exercises LUE Deficits / Details: Pt is stiff with AAROM; pt not lifting UE against gravity on his own but once lifted to about 80 degrees pt could hold against gravity for short time. unable to make a fist with L hand      Mobility Bed Mobility Overal bed mobility: Needs Assistance Bed Mobility: Rolling Rolling: +2 for safety/equipment;+2 for physical assistance;Total assist     Exercise     Balance     End of Session OT - End of Session Activity Tolerance: Patient  limited by lethargy;Patient limited by pain Patient left: in bed;with call bell/phone within reach;with bed alarm set  GO     Jules Schick [phone] 10/09/2013, 1:11 PM

## 2013-10-09 NOTE — Progress Notes (Signed)
Utilization review completed.  

## 2013-10-09 NOTE — Progress Notes (Signed)
I saw the patient and agree with the above assessment and plan.

## 2013-10-09 NOTE — Progress Notes (Signed)
[facility] Progress Note  Subjective:   Awake, answered a few questions  Objective Filed Vitals:   10/08/13 1322 10/08/13 1801 10/08/13 2044 10/09/13 0612  BP: 180/91 139/77 142/75 151/50  Pulse: 85 90 88 85  Temp: 97.9 F (36.6 C) 97.5 F (36.4 C) 97.9 F (36.6 C) 99 F (37.2 C)  TempSrc:  Oral Oral Oral  Resp: 17 16 18 18   Height:      Weight:   84 kg (185 lb 3 oz)   SpO2: 100% 100% 100% 100%   Physical Exam (Had not eaten yet)  General: restless in bed; moving about hitting stumps on bed rails HEENT:  Facial puffiness Heart: RRR Lungs: grossly clear without rales or wheezes Abdomen: soft NT Extremities: right BKA wrapped; left BKA with scabbed areas, no drainage Neuro:  At [facility] [place]; cannot answer year or president Dialysis Access: left upper AVF + bruit  D[facility]: [facility]  4h 57min 72.5kg 2/2.0 Bath LUA AVF Heparin 5000 / 2400  Hectorol 1 Epo none Venofer none   Assessment/Plan: 1. AMS - basically unchanged; alert but do meaningful conversation 2. ESRD - TTS - HD per routine 3. Anemia - Hgb 12.6 > 10; dose Aranesp 25 on Tuesday 4. Secondary hyperparathyroidism - corrected Ca 10.5 - use 2 Ca bath; hectorol being held - still on phoslo 5. HTN/volume - volume ^^^ by bed weights; doubt accuracy; UF net 1 L on 3/14 6. Nutrition - poor intake variable; discussed with nursing; poor intake recorded; needs to be fed; has nepro between meals 7. Fever - stump wound - for surgical revision 3/18 - on Vanc and Zosyn 8. PVD with bilateral BKAs 9. Hx CVA 10. Type 2 DM -D5 NS at 20/hr - poor intake 11.  Hep C + LFT ok 12. FTT/debiliatated/from SNF - palliative care has seen - still full Code; wife wants to wait until MS clears before making this decision; would surely need SNF after surgery  Myriam Jacobson, PA-C [place] [phone] 10/09/2013,9:55 AM  LOS: 8 days    Additional Objective Labs: Basic Metabolic  Panel:  Recent Labs Lab 10/04/13 0429  10/07/13 0825 10/08/13 0605 10/09/13 0525  NA 135*  < > 139 140 141  K 4.2  < > 4.6 4.6 4.5  CL 88*  < > 95* 97 98  CO2 28  < > 27 27 28   GLUCOSE 174*  < > 184* 124* 109*  BUN 44*  < > 43* 21 30*  CREATININE 5.80*  < > 6.10* 3.84* 5.34*  CALCIUM 10.5  < > 9.6 9.3 9.2  PHOS 5.1*  --   --   --   --   < > = values in this interval not displayed. Liver Function Tests:  Recent Labs Lab 10/07/13 0825 10/08/13 0605 10/09/13 0525  AST 23 33 21  ALT 18 18 15   ALKPHOS 60 58 54  BILITOT 0.3 0.4 0.3  PROT 7.5 7.7 7.0  ALBUMIN 2.7* 2.6* 2.3*   CBC:  Recent Labs Lab 10/05/13 0749 10/06/13 0620 10/07/13 0825 10/08/13 0605 10/09/13 0525  WBC 8.8 8.5 9.2 7.0 7.6  NEUTROABS 5.8 5.5 5.8 4.1 4.7  HGB 12.6* 11.3* 10.6* 11.0* 10.0*  HCT 38.2* 34.7* 32.1* 34.4* 30.5*  MCV 82.9 84.2 83.2 86.0 84.5  PLT 310 310 342 330 348   Blood Culture    Component Value Date/Time   SDES BLOOD RIGHT FOREARM 10/05/2013 2256   [place]  DRAWN AEROBIC AND ANAEROBIC 10CC 10/05/2013 2256   CULT  Value:        BLOOD CULTURE RECEIVED NO GROWTH TO DATE CULTURE WILL BE HELD FOR 5 DAYS BEFORE ISSUING A FINAL NEGATIVE REPORT Performed at [facility] 10/05/2013 2256   REPTSTATUS PENDING 10/05/2013 2256    Cardiac Enzymes:  Recent Labs Lab 10/02/13 1630 10/02/13 2151 10/03/13 0304  CKTOTAL 351* 339* 345*  CKMB 2.2 2.5 2.9  TROPONINI <0.30 <0.30 <0.30   CBG:  Recent Labs Lab 10/08/13 1707 10/08/13 2008 10/09/13 10/09/13 0408 10/09/13 0846  GLUCAP 113* 131* 180* 82 120*   Medications: . dextrose 5 % and 0.9% NaCl 20 mL (10/08/13 1818)   . collagenase   Topical Daily  . feeding supplement (NEPRO CARB STEADY)  237 mL Oral BID BM  . heparin  5,000 Units Subcutaneous 3 times per day  . insulin aspart  0-20 Units Subcutaneous 6 times per day  . labetalol  300 mg Oral BID  . neomycin-bacitracin-polymyxin   Topical TID  .  piperacillin-tazobactam (ZOSYN)  IV  2.25 g Intravenous 3 times per day  . sodium chloride  3 mL Intravenous Q12H  . vancomycin  750 mg Intravenous Q T,Th,Sa-HD

## 2013-10-09 NOTE — Progress Notes (Signed)
[facility] PROGRESS NOTE  Oscar Castillo [id number] DOB: 12/05/1950 DOA: 10/01/2013 PCP: [name], MD  Assessment/Plan:  Encephalopathy, metabolic  -Suspect dehydration and possible underlying infectious processes as etiology; 3/13 patient's cognition appears to be clearing. Patient eating and answering simple questions  -[name]/O. x3 (did not know when); however RN states patient waxes and wanes   New LBBB  -has been transient in nature  -systolic function normal but septal motion with abnormal function and dyssynergy  -cardiac enzymes negative  -doubt pt appropriate for invasive cardiac work up   Dehydration /FTT (failure to thrive) in adult  -Continue IV fluid-type of fluid adjusted by nephrology today  - Speech and Language Pathologist (SLP) attempted 10/02/2013 the patient unable to arouse enough to eval but later re examined and now on D1 diet  -Patient has begun to eat his meals., However very cachectic continue ensure 3 times a day between meals.  -IV to KVO except when giving meds    Fever  -Unclear etiology- suspect DH and atelectasis  -question of right lower lobe right but currently is not hypoxic  -He did have leukocytosis so continue empiric antibiotics as precaution  -Patient unable to contribute to history and given altered mentation and fever an LP was done to rule out possible meningitis-CSF studies pending-partial results with 35 of protein and 156 of glucose so not indicative of bacterial meningitis-CSF culture no growth  -Influenza PCR negative  -Medication reconciliation list that patient was on doxycycline prior to admission which was started on 09/29/2013 or suspected postoperative wound infection/possible osteomyelitis and scheduled to go for 3 weeks  -Blood cultures no growth to date urinalysis unremarkable  -3/14 Right BKA stump shows abscess/osteomyelitis by MRI;; see results below  -3/16 patient remains afebrile; surgeon's plan to perform  abscess drainage/revision of right BKA stump on Wednesday 3/18    HYPERTENSION  -Although blood pressure not within AHA guidelines will allow permissive HTN given patient's waxing and waning cognitive state, poor perfusion secondary to severe PVD   -Continue labetalol 300 mg BID  -Start hydralazine 5 mg PRN SBP> 160 or DBP> 100  Anemia due to chronic illness  -Hemoglobin has trended down to 13 after hydration noting baseline just under 11  -Monitor closely currently at 10, repeat H./H. (true drop?)  DM (diabetes mellitus) type I controlled with renal manifestation  -On Lantus prior to admission -Patient has begun to eat at least partial to all portions of his meals. Being supplemented by Ensure between meals.    -Continue resistant SSI; CBG currently controlled on this regimen   CKD (chronic kidney disease) stage V requiring chronic dialysis  -Usual dialysis days Tuesday Thursday Saturday  -No ultrafiltration secondary to ongoing dehydration   Chronic combined systolic (EF [id number]) and grade 2 diastolic congestive heart failure  -Compensated and managed with dialysis on T/Th/Sat  HEPATITIS C, HX OF   CEREBROVASCULAR ACCIDENT, HX OF -No evidence of acute infarct   Hyperlipidemia -lipid panel; w/i NCEP guidelines  Right BKA -Pain is increasing since surgery in February 2015. MRI 3/13 shows pathology see results below -3/14 MRI of right BKA stump shows abscess/osteomyelitis see results below -3/15 stop much less tender today, negative discharge, fibrotic tissue along the skin closure. -3/16 right BKA stump more tender today on exam.    Osteomyelitis right BKA stump -Continue  Zosyn + vancomycin -3/15 seen by Dr. Jean Rosenthal (orthopedic surgery), round discuss abnormal findings of MRI right BKA stump and decide on course of action  on 3/16 (debridement, stump revision?) -Continue morphine 1-2 mg q. 4 hours PRN severe pain in right BKA stump. -Patient evaluated by Dr. Meridee Score (orthopedic surgeon) who plans to address abscess/osteomyelitis of the right BKA stump Wednesday 3/16   Code Status: Full Family Communication:  Wife and sister present for discussion of plan of care  Disposition Plan: We will await recommendations from palliative care   Consultants: Dr. Roney Jaffe Nephrology  Dr Meridee Score (Orthopedic Surgery) Dr. Jean Rosenthal (orthopedic surgery) Dr. Rhea Pink (palliative care) Radiology for lumbar puncture   Procedures: MRI tib-fib right BKA stump 10/06/2013 Findings consistent with a focal abscess at the tip of the stumps of  the distal tibia and fibula.  Subtle edema at the tip of the stumps of the tibia and fibula most  likely represent focal osteomyelitis because the fluid collections,  which probably represent pus, are immediately adjacent to the bones.  but there is no discrete bone destruction.  Probable adjacent myositis/cellulitis.   CT head without contrast 10/01/2013 1. There is no evidence of an acute intracranial hemorrhage nor of  an evolving ischemic infarction.  2. There is stable diffuse atrophy with evidence of chronic small  vessel ischemic change.  3. There is no intracranial mass effect.  4. There is no evidence of an acute skull fracture.   Lumbar puncture by interventional radiology   Antibiotics: Zosyn 3/14>> Vancomycin (full dose) 3/14>> Aztreonam 3/08 >>> stopped 3/14 Vancomycin 3/08 >>> stopped 3/14      HPI/Subjective: 63 year old male PMHx HTN, HLD, Hx diabetes type 1 controlled, vasculopath, S/P recent right BKA on 08/2013. He also has a prior history of CVA in 2009 and is on chronic hemodialysis. In addition he has combined systolic& diastolic heart failure with an EF of 45%. According to the patient's wife he had been doing well in the postoperative period until around the same time he followed up with Dr. Sharol Given. At that time he started with poor intake and gradually increasing  altered mentation. 09/24/2013 he was very confused when not work with the therapist. On 09/26/2013 a meeting was called at the nursing facility because he was experiencing extreme symptoms of failure to thrive and they were concerned the patient was depressed. Palliative care team had apparently been meeting with the family prior to admission. By 09/29/2013 he was a little more coherent and began trying to eat but later in the morning he would not take his meds kept his mouth shut.  Postoperatively he has had 2 courses of antibiotics for concerns of possible stump infection. In regards to dietary intake: in addition to the above the patient has been on a dysphagia diet with pured foods begun after the patient was noted to be coughing with attempts to eat.  Upon presentation to the emergency department EMS reported that the wife found food in the patient's mouth and he was unresponsive. His CBG was 379. He was hypoxemic and placed on a nonrebreather mask. His hemoglobin at presentation was 16.7 which was markedly elevated compared to prior readings between 9.9 and 11.5. He was also noted to have significant decreased breath sounds on the right without wheezing or crackles. The right BKA stump was unremarkable in appearance. Was also found to be mildly febrile with a rectal temperature of 101. Chest x-ray was concerning for possible right lower lobe pneumonia. 3/12 patient just returned from HD somewhat lethargic, complained of right BKA stump pain which has been increasing over several days 3/13 patient interacting  with medical staff appropriately, able to participate in dressing change of right BKA 3/14 patient alert speaking in full sentences, sitting in bed eating lunch. Writhing in pain and indicating pain is in his right BKA stump. 3/15 patient states the pain in his right BKA stump significantly decreased on the pain regimen he is on now. Negative N./V. states has been eating his meals today. States  understands he has an infection in the right BKA stump and further surgery may be required. 3/16 patient able to answer some questions, and states understands the plan for right BKA stump revision.  Pain slightly increased over yesterday.   Objective: Filed Vitals:   10/08/13 1801 10/08/13 2044 10/09/13 0612 10/09/13 0900  BP: 139/77 142/75 151/50 161/80  Pulse: 90 88 85 80  Temp: 97.5 F (36.4 C) 97.9 F (36.6 C) 99 F (37.2 C) 97.9 F (36.6 C)  TempSrc: Oral Oral Oral Oral  Resp: 16 18 18 19   Height:      Weight:  84 kg (185 lb 3 oz)    SpO2: 100% 100% 100% 100%    Intake/Output Summary (Last 24 hours) at 10/09/13 1507 Last data filed at 10/09/13 1300  Gross per 24 hour  Intake   1060 ml  Output      0 ml  Net   1060 ml   Filed Weights   10/07/13 1203 10/07/13 1955 10/08/13 2044  Weight: 72 kg (158 lb 11.7 oz) 83 kg (182 lb 15.7 oz) 84 kg (185 lb 3 oz)    Exam:   General:  A./O. x3 (does not know when) , follows all commands, mild discomfort secondary to right BKA stump pain   Cardiovascular: Regular rhythm and rate, negative murmurs rubs gallop  Respiratory: Clear to auscultation bilateral  Abdomen: Soft, nontender, nondistended plus bowel sound  Musculoskeletal: Bilateral BKA, left BKA performed November 2014 negative tenderness to palpation; right BKA performed February 2015 mildly-moderately tender to palpation along the anterior/right lateral aspect of stump (patient on pain regimen currently). Negative discharge from right stump, continued warmth to touch      Data Reviewed: Basic Metabolic Panel:  Recent Labs Lab 10/04/13 0429 10/05/13 0749 10/06/13 0620 10/07/13 0825 10/08/13 0605 10/09/13 0525  NA 135* 132* 139 139 140 141  K 4.2 3.9 4.4 4.6 4.6 4.5  CL 88* 87* 95* 95* 97 98  CO2 28 24 27 27 27 28   GLUCOSE 174* 222* 174* 184* 124* 109*  BUN 44* 61* 29* 43* 21 30*  CREATININE 5.80* 7.02* 4.36* 6.10* 3.84* 5.34*  CALCIUM 10.5 10.2 9.9 9.6 9.3  9.2  MG  --  2.3 2.1 2.1 2.1 2.0  PHOS 5.1*  --   --   --   --   --    Liver Function Tests:  Recent Labs Lab 10/05/13 0749 10/06/13 0620 10/07/13 0825 10/08/13 0605 10/09/13 0525  AST 47* 32 23 33 21  ALT 34 25 18 18 15   ALKPHOS 66 60 60 58 54  BILITOT 0.5 0.4 0.3 0.4 0.3  PROT 8.5* 8.1 7.5 7.7 7.0  ALBUMIN 2.7* 2.9* 2.7* 2.6* 2.3*   No results found for this basename: LIPASE, AMYLASE,  in the last 168 hours No results found for this basename: AMMONIA,  in the last 168 hours CBC:  Recent Labs Lab 10/05/13 0749 10/06/13 0620 10/07/13 0825 10/08/13 0605 10/09/13 0525  WBC 8.8 8.5 9.2 7.0 7.6  NEUTROABS 5.8 5.5 5.8 4.1 4.7  HGB 12.6* 11.3* 10.6* 11.0*  10.0*  HCT 38.2* 34.7* 32.1* 34.4* 30.5*  MCV 82.9 84.2 83.2 86.0 84.5  PLT 310 310 342 330 348   Cardiac Enzymes:  Recent Labs Lab 10/02/13 1630 10/02/13 2151 10/03/13 0304  CKTOTAL 351* 339* 345*  CKMB 2.2 2.5 2.9  TROPONINI <0.30 <0.30 <0.30   BNP (last 3 results)  Recent Labs  10/01/13 1135  PROBNP 1397.0*   CBG:  Recent Labs Lab 10/08/13 2008 10/09/13 10/09/13 0408 10/09/13 0846 10/09/13 1209  GLUCAP 131* 180* 82 120* 126*    Recent Results (from the past 240 hour(s))  CULTURE, BLOOD (ROUTINE X 2)     Status: None   Collection Time    10/01/13 11:35 AM      Result Value Ref Range Status   Specimen Description BLOOD RIGHT WRIST   Final   Special Requests BOTTLES DRAWN AEROBIC ONLY 10CC   Final   Culture  Setup Time     Final   Value: 10/01/2013 19:00     Performed at [facility]   Culture     Final   Value: NO GROWTH 5 DAYS     Performed at [facility]   Report Status 10/07/2013 FINAL   Final  CULTURE, BLOOD (ROUTINE X 2)     Status: None   Collection Time    10/01/13 12:01 PM      Result Value Ref Range Status   Specimen Description BLOOD RIGHT FOREARM   Final   Special Requests BOTTLES DRAWN AEROBIC AND ANAEROBIC 5CC   Final   Culture  Setup Time     Final    Value: 10/01/2013 18:59     Performed at [facility]   Culture     Final   Value: NO GROWTH 5 DAYS     Performed at [facility]   Report Status 10/07/2013 FINAL   Final  CSF CULTURE     Status: None   Collection Time    10/02/13 10:55 AM      Result Value Ref Range Status   Specimen Description CSF   Final   Special Requests Normal   Final   Gram Stain     Final   Value: FEW WBC PRESENT, PREDOMINANTLY MONONUCLEAR     NO ORGANISMS SEEN     Performed at [facility]   Culture     Final   Value: NO GROWTH 3 DAYS     Performed at [facility]   Report Status 10/06/2013 FINAL   Final  GRAM STAIN     Status: None   Collection Time    10/02/13 10:55 AM      Result Value Ref Range Status   Specimen Description CSF   Final   Special Requests Normal   Final   Gram Stain     Final   Value: CYTOSPIN PREP     WBC PRESENT, PREDOMINANTLY MONONUCLEAR     NO ORGANISMS SEEN   Report Status 10/02/2013 FINAL   Final  MRSA PCR SCREENING     Status: None   Collection Time    10/02/13 11:18 PM      Result Value Ref Range Status   MRSA by PCR NEGATIVE  NEGATIVE Final   Comment:            The GeneXpert MRSA Assay (FDA     approved for NASAL specimens     only), is one component of a     comprehensive  MRSA colonization     surveillance program. It is not     intended to diagnose MRSA     infection nor to guide or     monitor treatment for     MRSA infections.  CLOSTRIDIUM DIFFICILE BY PCR     Status: None   Collection Time    10/05/13  2:05 AM      Result Value Ref Range Status   C difficile by pcr NEGATIVE  NEGATIVE Final  CULTURE, BLOOD (ROUTINE X 2)     Status: None   Collection Time    10/05/13 10:42 PM      Result Value Ref Range Status   Specimen Description BLOOD RIGHT UPPER ARM   Final   Special Requests BOTTLES DRAWN AEROBIC AND ANAEROBIC 10CC   Final   Culture  Setup Time     Final   Value: 10/06/2013 03:56     Performed at [facility]   Culture     Final   Value:        BLOOD CULTURE RECEIVED NO GROWTH TO DATE CULTURE WILL BE HELD FOR 5 DAYS BEFORE ISSUING A FINAL NEGATIVE REPORT     Performed at [facility]   Report Status PENDING   Incomplete  CULTURE, BLOOD (ROUTINE X 2)     Status: None   Collection Time    10/05/13 10:56 PM      Result Value Ref Range Status   Specimen Description BLOOD RIGHT FOREARM   Final   Special Requests BOTTLES DRAWN AEROBIC AND ANAEROBIC 10CC   Final   Culture  Setup Time     Final   Value: 10/06/2013 03:56     Performed at [facility]   Culture     Final   Value:        BLOOD CULTURE RECEIVED NO GROWTH TO DATE CULTURE WILL BE HELD FOR 5 DAYS BEFORE ISSUING A FINAL NEGATIVE REPORT     Performed at [facility]   Report Status PENDING   Incomplete     Studies: No results found.  Scheduled Meds: . bisacodyl  5 mg Oral BID  . collagenase   Topical Daily  . [START ON 10/10/2013] darbepoetin (ARANESP) injection - DIALYSIS  25 mcg Intravenous Q Tue-HD  . feeding supplement (NEPRO CARB STEADY)  237 mL Oral BID BM  . heparin  5,000 Units Subcutaneous 3 times per day  . insulin aspart  0-20 Units Subcutaneous 6 times per day  . labetalol  300 mg Oral BID  . neomycin-bacitracin-polymyxin   Topical TID  . ondansetron (ZOFRAN) IV  4 mg Intravenous 3 times per day  . piperacillin-tazobactam (ZOSYN)  IV  2.25 g Intravenous 3 times per day  . sodium chloride  3 mL Intravenous Q12H  . vancomycin  750 mg Intravenous Q T,Th,Sa-HD   Continuous Infusions: . dextrose 5 % and 0.9% NaCl 20 mL (10/08/13 1818)    Active Problems:   HYPERTENSION   HEPATITIS C, HX OF   CEREBROVASCULAR ACCIDENT, HX OF   BENIGN PROSTATIC HYPERTROPHY   Hyperlipidemia   Anemia due to chronic illness   DM (diabetes mellitus) type I controlled with renal manifestation   Chronic combined systolic (EF [id number]) and grade 2diastolic congestive heart failure   FTT (failure to thrive) in  adult   Encephalopathy, metabolic   Dehydration   Fever    Time spent:40 minutes    WOODS, CURTIS, J  [facility]  Pager [phone]. If 7PM-7AM, please contact night-coverage at [email or web address], password [facility] 10/09/2013, 3:07 PM  LOS: 8 days

## 2013-10-09 NOTE — Progress Notes (Signed)
Patient ID: Oscar Castillo, male   DOB: 04/28/1951, 63 y.o.   MRN: [id number] Patient with ischemic right transtibial amputation. MRI suggestive of abscess. Will plan for revision right transtibial amputation on Wednesday.

## 2013-10-10 LAB — COMPREHENSIVE METABOLIC PANEL
ALT: 12 U/L (ref 0–53)
AST: 16 U/L (ref 0–37)
Albumin: 2.2 g/dL — ABNORMAL LOW (ref 3.5–5.2)
Alkaline Phosphatase: 50 U/L (ref 39–117)
BUN: 42 mg/dL — ABNORMAL HIGH (ref 6–23)
CALCIUM: 9.3 mg/dL (ref 8.4–10.5)
CO2: 25 meq/L (ref 19–32)
Chloride: 98 mEq/L (ref 96–112)
Creatinine, Ser: 6.61 mg/dL — ABNORMAL HIGH (ref 0.50–1.35)
GFR calc Af Amer: 9 mL/min — ABNORMAL LOW (ref >90)
GFR calc non Af Amer: 8 mL/min — ABNORMAL LOW (ref >90)
Glucose, Bld: 125 mg/dL — ABNORMAL HIGH (ref 70–99)
Potassium: 5.2 mEq/L (ref 3.7–5.3)
SODIUM: 141 meq/L (ref 137–147)
TOTAL PROTEIN: 7 g/dL (ref 6.0–8.3)
Total Bilirubin: 0.3 mg/dL (ref 0.3–1.2)

## 2013-10-10 LAB — CBC WITH DIFFERENTIAL/PLATELET
Basophils Absolute: 0 10*3/uL (ref 0.0–0.1)
Basophils Relative: 0 % (ref 0–1)
Eosinophils Absolute: 0.2 10*3/uL (ref 0.0–0.7)
Eosinophils Relative: 2 % (ref 0–5)
HEMATOCRIT: 29.1 % — AB (ref 39.0–52.0)
HEMOGLOBIN: 9.5 g/dL — AB (ref 13.0–17.0)
LYMPHS ABS: 2.2 10*3/uL (ref 0.7–4.0)
Lymphocytes Relative: 31 % (ref 12–46)
MCH: 27.3 pg (ref 26.0–34.0)
MCHC: 32.6 g/dL (ref 30.0–36.0)
MCV: 83.6 fL (ref 78.0–100.0)
MONOS PCT: 9 % (ref 3–12)
Monocytes Absolute: 0.6 10*3/uL (ref 0.1–1.0)
Neutro Abs: 4.3 10*3/uL (ref 1.7–7.7)
Neutrophils Relative %: 58 % (ref 43–77)
Platelets: 367 10*3/uL (ref 150–400)
RBC: 3.48 MIL/uL — AB (ref 4.22–5.81)
RDW: 19 % — ABNORMAL HIGH (ref 11.5–15.5)
WBC: 7.3 10*3/uL (ref 4.0–10.5)

## 2013-10-10 LAB — GLUCOSE, CAPILLARY
GLUCOSE-CAPILLARY: 110 mg/dL — AB (ref 70–99)
GLUCOSE-CAPILLARY: 138 mg/dL — AB (ref 70–99)
Glucose-Capillary: 141 mg/dL — ABNORMAL HIGH (ref 70–99)
Glucose-Capillary: 114 mg/dL — ABNORMAL HIGH (ref 70–99)
Glucose-Capillary: 156 mg/dL — ABNORMAL HIGH (ref 70–99)

## 2013-10-10 LAB — TYPE AND SCREEN
ABO/RH(D): O POS
Antibody Screen: NEGATIVE

## 2013-10-10 LAB — MAGNESIUM: Magnesium: 2.2 mg/dL (ref 1.5–2.5)

## 2013-10-10 LAB — PHOSPHORUS: PHOSPHORUS: 5.7 mg/dL — AB (ref 2.3–4.6)

## 2013-10-10 MED ORDER — SODIUM CHLORIDE 0.9 % IV SOLN: 100.0000 mL | INTRAVENOUS | Status: DC | PRN

## 2013-10-10 MED ORDER — VANCOMYCIN HCL IN DEXTROSE 1-5 GM/200ML-% IV SOLN
1000.0000 mg | INTRAVENOUS | Status: DC
  Filled 2013-10-10: qty 200

## 2013-10-10 MED ORDER — PENTAFLUOROPROP-TETRAFLUOROETH EX AERO: 1.0000 "application " | INHALATION_SPRAY | CUTANEOUS | Status: DC | PRN

## 2013-10-10 MED ORDER — ALTEPLASE 2 MG IJ SOLR: 2.0000 mg | Freq: Once | INTRAMUSCULAR | Status: AC | PRN

## 2013-10-10 MED ORDER — LIDOCAINE-PRILOCAINE 2.5-2.5 % EX CREA: 1.0000 "application " | TOPICAL_CREAM | CUTANEOUS | Status: DC | PRN

## 2013-10-10 MED ORDER — DARBEPOETIN ALFA-POLYSORBATE 25 MCG/0.42ML IJ SOLN
INTRAMUSCULAR | Status: AC
  Filled 2013-10-10: qty 0.42

## 2013-10-10 MED ORDER — HEPARIN SODIUM (PORCINE) 1000 UNIT/ML DIALYSIS
20.0000 [IU]/kg | INTRAMUSCULAR | Status: DC | PRN
  Administered 2013-10-10: 1700 [IU] via INTRAVENOUS_CENTRAL

## 2013-10-10 MED ORDER — SODIUM CHLORIDE 0.9 % IV SOLN
100.0000 mL | INTRAVENOUS | Status: DC | PRN
  Administered 2013-10-11: 10 mL/h via INTRAVENOUS
  Administered 2013-10-11: 10:00:00 via INTRAVENOUS

## 2013-10-10 MED ORDER — HEPARIN SODIUM (PORCINE) 1000 UNIT/ML DIALYSIS: 1000.0000 [IU] | INTRAMUSCULAR | Status: DC | PRN

## 2013-10-10 MED ORDER — CHLORHEXIDINE GLUCONATE 4 % EX LIQD
60.0000 mL | Freq: Once | CUTANEOUS | Status: AC
  Administered 2013-10-11: 4 via TOPICAL
  Filled 2013-10-10: qty 60

## 2013-10-10 MED ORDER — NEPRO/CARBSTEADY PO LIQD: 237.0000 mL | ORAL | Status: DC | PRN

## 2013-10-10 MED ORDER — LIDOCAINE HCL (PF) 1 % IJ SOLN: 5.0000 mL | INTRAMUSCULAR | Status: DC | PRN

## 2013-10-10 MED ORDER — PRO-STAT SUGAR FREE PO LIQD
30.0000 mL | Freq: Two times a day (BID) | ORAL | Status: DC
  Administered 2013-10-12 – 2013-10-13 (×3): 30 mL via ORAL
  Filled 2013-10-10 (×11): qty 30

## 2013-10-10 NOTE — Progress Notes (Signed)
ANTIBIOTIC CONSULT NOTE - Follow-up  Pharmacy Consult for Vancomycin and Zosyn Indication: Right BKA abscess/osteomylitis  No Known Allergies  Patient Measurements: Height:  (bilateral amputee) Weight: 194 lb 0.1 oz (88 kg) IBW/kg (Calculated) : 40.8  Recent Labs  10/08/13 0605 10/09/13 0525 10/09/13 1700 10/10/13 0800  WBC 7.0 7.6  --  7.3  HGB 11.0* 10.0* 10.1* 9.5*  PLT 330 348  --  367  CREATININE 3.84* 5.34*  --  6.61*   Estimated Creatinine Clearance: 9.8 ml/min (by C-G formula based on Cr of 6.61). No results found for this basename: VANCOTROUGH, Corlis Leak, VANCORANDOM, GENTTROUGH, GENTPEAK, GENTRANDOM, TOBRATROUGH, TOBRAPEAK, TOBRARND, AMIKACINPEAK, AMIKACINTROU, AMIKACIN,  in the last 72 hours   Microbiology: Recent Results (from the past 720 hour(s))  CULTURE, BLOOD (ROUTINE X 2)     Status: None   Collection Time    10/01/13 11:35 AM      Result Value Ref Range Status   Specimen Description BLOOD RIGHT WRIST   Final   Special Requests BOTTLES DRAWN AEROBIC ONLY 10CC   Final   Culture  Setup Time     Final   Value: 10/01/2013 19:00     Performed at [facility]   Culture     Final   Value: NO GROWTH 5 DAYS     Performed at [facility]   Report Status 10/07/2013 FINAL   Final  CULTURE, BLOOD (ROUTINE X 2)     Status: None   Collection Time    10/01/13 12:01 PM      Result Value Ref Range Status   Specimen Description BLOOD RIGHT FOREARM   Final   Special Requests BOTTLES DRAWN AEROBIC AND ANAEROBIC 5CC   Final   Culture  Setup Time     Final   Value: 10/01/2013 18:59     Performed at [facility]   Culture     Final   Value: NO GROWTH 5 DAYS     Performed at [facility]   Report Status 10/07/2013 FINAL   Final  CSF CULTURE     Status: None   Collection Time    10/02/13 10:55 AM      Result Value Ref Range Status   Specimen Description CSF   Final   Special Requests Normal   Final   Gram Stain     Final   Value:  FEW WBC PRESENT, PREDOMINANTLY MONONUCLEAR     NO ORGANISMS SEEN     Performed at [facility]   Culture     Final   Value: NO GROWTH 3 DAYS     Performed at [facility]   Report Status 10/06/2013 FINAL   Final  GRAM STAIN     Status: None   Collection Time    10/02/13 10:55 AM      Result Value Ref Range Status   Specimen Description CSF   Final   Special Requests Normal   Final   Gram Stain     Final   Value: CYTOSPIN PREP     WBC PRESENT, PREDOMINANTLY MONONUCLEAR     NO ORGANISMS SEEN   Report Status 10/02/2013 FINAL   Final  MRSA PCR SCREENING     Status: None   Collection Time    10/02/13 11:18 PM      Result Value Ref Range Status   MRSA by PCR NEGATIVE  NEGATIVE Final   Comment:  The GeneXpert MRSA Assay (FDA     approved for NASAL specimens     only), is one component of a     comprehensive MRSA colonization     surveillance program. It is not     intended to diagnose MRSA     infection nor to guide or     monitor treatment for     MRSA infections.  CLOSTRIDIUM DIFFICILE BY PCR     Status: None   Collection Time    10/05/13  2:05 AM      Result Value Ref Range Status   C difficile by pcr NEGATIVE  NEGATIVE Final  CULTURE, BLOOD (ROUTINE X 2)     Status: None   Collection Time    10/05/13 10:42 PM      Result Value Ref Range Status   Specimen Description BLOOD RIGHT UPPER ARM   Final   Special Requests BOTTLES DRAWN AEROBIC AND ANAEROBIC 10CC   Final   Culture  Setup Time     Final   Value: 10/06/2013 03:56     Performed at [facility]   Culture     Final   Value:        BLOOD CULTURE RECEIVED NO GROWTH TO DATE CULTURE WILL BE HELD FOR 5 DAYS BEFORE ISSUING A FINAL NEGATIVE REPORT     Performed at [facility]   Report Status PENDING   Incomplete  CULTURE, BLOOD (ROUTINE X 2)     Status: None   Collection Time    10/05/13 10:56 PM      Result Value Ref Range Status   Specimen Description BLOOD RIGHT FOREARM    Final   Special Requests BOTTLES DRAWN AEROBIC AND ANAEROBIC 10CC   Final   Culture  Setup Time     Final   Value: 10/06/2013 03:56     Performed at [facility]   Culture     Final   Value:        BLOOD CULTURE RECEIVED NO GROWTH TO DATE CULTURE WILL BE HELD FOR 5 DAYS BEFORE ISSUING A FINAL NEGATIVE REPORT     Performed at [facility]   Report Status PENDING   Incomplete    Assessment: 63 year old male from NH with recent admit in Feb 2015 for R gangrenous foot. Admitted for AMS and initially initiated on broad-spectrum antibiotics for possible HCAP and stump wound infxn. MRI consistent for focal abscess/osteomylitis. Plan OR tomorrow for stump revision  Aztreo 3/8>>3/14 Vanc 3/8>> Zosyn 3/14  3/9 CSF - NGTD 3/9 MRSA - NEG 3/8 Blood - NGTD  Goal of Therapy:  Pre-HD Vancomycin level 15-25 mcg/ml  Plan:  Cont Zosyn 2.25 g IV q8h Continue vanc 750mg  IV post-HD F/u renal plans, C&S, clinical status, will consider pre-HD level when appropriate  Excell Seltzer, PharmD Clinical Pharmacist Phone: [phone] Pager: [phone] 10/10/2013 11:10 AM

## 2013-10-10 NOTE — Procedures (Signed)
I was present at this dialysis session. I have reviewed the session itself and made appropriate changes.   Plans for revision of surgical site noted.  Goal UF 4L.  Pt remains oriented to self only.    Pearson Grippe  MD 10/10/2013, 9:04 AM

## 2013-10-10 NOTE — Progress Notes (Signed)
[facility] PROGRESS NOTE  [name] [id number] DOB: 1951-03-24 DOA: 10/01/2013 PCP: [name], MD  Assessment/Plan:  Encephalopathy, metabolic  -Suspect dehydration and possible underlying infectious processes as etiology; 3/13 patient's cognition appears to be clearing. Patient eating and answering simple questions  -3/70 [name]/O. Oscar Castillo, very confused however patient S/P dialysis today    New LBBB  -has been transient in nature  -systolic function normal but septal motion with abnormal function and dyssynergy  -cardiac enzymes negative  -doubt pt appropriate for invasive cardiac work up   Dehydration /FTT (failure to thrive) in adult  -Continue IV fluid-type of fluid adjusted by nephrology today  - Speech and Language Pathologist (SLP) attempted 10/02/2013 the patient unable to arouse enough to eval but later re examined and now on D1 diet  -Patient has begun to eat his meals., However very cachectic continue ensure 3 times a day between meals.  -IV to KVO except when giving meds    Fever  -Unclear etiology- suspect DH and atelectasis  -question of right lower lobe right but currently is not hypoxic  -He did have leukocytosis so continue empiric antibiotics as precaution  -Patient unable to contribute to history and given altered mentation and fever an LP was done to rule out possible meningitis-CSF studies pending-partial results with 35 of protein and 156 of glucose so not indicative of bacterial meningitis-CSF culture no growth  -Influenza PCR negative  -Medication reconciliation list that patient was on doxycycline prior to admission which was started on 09/29/2013 or suspected postoperative wound infection/possible osteomyelitis and scheduled to go for 3 weeks  -Blood cultures no growth to date urinalysis unremarkable  -3/14 Right BKA stump shows abscess/osteomyelitis by MRI;; see results below  -3/17 patient remains afebrile; surgeon's plan to perform abscess  drainage/revision of right BKA stump on Wednesday 3/18    HYPERTENSION  -Although blood pressure not within AHA guidelines will allow permissive HTN given patient's waxing and waning cognitive state, poor perfusion secondary to severe PVD   -Continue labetalol 300 mg BID  -Continue hydralazine 5 mg PRN SBP> 160 or DBP> 100  Anemia due to chronic illness  -Hemoglobin has trended down to 13 after hydration noting baseline just under 11  -3/17 Monitor closely currently at 9.5, repeat H./H.  -3/17 type and cross. With patient having revision in a.m. of right BKA will most likely need post surgical transfusion (would use hemoconcentrated PRBC)  DM (diabetes mellitus) type I controlled with renal manifestation  -On Lantus prior to admission -Patient has begun to eat at least partial to all portions of his meals. Being supplemented by Ensure between meals.    -Continue resistant SSI; CBG currently controlled on this regimen   CKD (chronic kidney disease) stage V requiring chronic dialysis  -Usual dialysis days Tuesday Thursday Saturday  -No ultrafiltration secondary to ongoing dehydration   Chronic combined systolic (EF [id number]) and grade 2 diastolic congestive heart failure  -Compensated and managed with dialysis on T/Th/Sat  HEPATITIS C, HX OF   CEREBROVASCULAR ACCIDENT, HX OF -No evidence of acute infarct   Hyperlipidemia -lipid panel; w/i NCEP guidelines  Right BKA -Pain is increasing since surgery in February 2015. MRI 3/13 shows pathology see results below -3/14 MRI of right BKA stump shows abscess/osteomyelitis see results below -3/15 stop much less tender today, negative discharge, fibrotic tissue along the skin closure. -3/16 right BKA stump more tender today on exam. -3/17 patient's right BKA stump nontender to palpation there    Osteomyelitis  right BKA stump -Continue  Zosyn + vancomycin -3/15 seen by Dr. Jean Rosenthal (orthopedic surgery), round discuss abnormal  findings of MRI right BKA stump and decide on course of action on 3/16 (debridement, stump revision?) -Continue morphine 1-2 mg q. 4 hours PRN severe pain in right BKA stump. -Patient evaluated by Dr. Meridee Score (orthopedic surgeon) who plans to address abscess/osteomyelitis of the right BKA stump Wednesday 3/16   Code Status: Full Family Communication:  Wife and sister present for discussion of plan of care  Disposition Plan: We will await recommendations from palliative care   Consultants: Dr. Roney Jaffe Nephrology  Dr Meridee Score (Orthopedic Surgery) Dr. Jean Rosenthal (orthopedic surgery) Dr. Rhea Pink (palliative care) Radiology for lumbar puncture   Procedures: MRI tib-fib right BKA stump 10/06/2013 Findings consistent with a focal abscess at the tip of the stumps of  the distal tibia and fibula.  Subtle edema at the tip of the stumps of the tibia and fibula most  likely represent focal osteomyelitis because the fluid collections,  which probably represent pus, are immediately adjacent to the bones.  but there is no discrete bone destruction.  Probable adjacent myositis/cellulitis.   CT head without contrast 10/01/2013 1. There is no evidence of an acute intracranial hemorrhage nor of  an evolving ischemic infarction.  2. There is stable diffuse atrophy with evidence of chronic small  vessel ischemic change.  3. There is no intracranial mass effect.  4. There is no evidence of an acute skull fracture.   Lumbar puncture by interventional radiology   Antibiotics: Zosyn 3/14>> Vancomycin (full dose) 3/14>> Aztreonam 3/08 >>> stopped 3/14 Vancomycin 3/08 >>> stopped 3/14      HPI/Subjective: 63 year old male PMHx HTN, HLD, Hx diabetes type 1 controlled, vasculopath, S/P recent right BKA on 08/2013. He also has a prior history of CVA in 2009 and is on chronic hemodialysis. In addition he has combined systolic& diastolic heart failure with an EF of 45%.  According to the patient's wife he had been doing well in the postoperative period until around the same time he followed up with Dr. Sharol Given. At that time he started with poor intake and gradually increasing altered mentation. 09/24/2013 he was very confused when not work with the therapist. On 09/26/2013 a meeting was called at the nursing facility because he was experiencing extreme symptoms of failure to thrive and they were concerned the patient was depressed. Palliative care team had apparently been meeting with the family prior to admission. By 09/29/2013 he was a little more coherent and began trying to eat but later in the morning he would not take his meds kept his mouth shut.  Postoperatively he has had 2 courses of antibiotics for concerns of possible stump infection. In regards to dietary intake: in addition to the above the patient has been on a dysphagia diet with pured foods begun after the patient was noted to be coughing with attempts to eat.  Upon presentation to the emergency department EMS reported that the wife found food in the patient's mouth and he was unresponsive. His CBG was 379. He was hypoxemic and placed on a nonrebreather mask. His hemoglobin at presentation was 16.7 which was markedly elevated compared to prior readings between 9.9 and 11.5. He was also noted to have significant decreased breath sounds on the right without wheezing or crackles. The right BKA stump was unremarkable in appearance. Was also found to be mildly febrile with a rectal temperature of 101. Chest x-ray was  concerning for possible right lower lobe pneumonia. 3/12 patient just returned from HD somewhat lethargic, complained of right BKA stump pain which has been increasing over several days 3/13 patient interacting with medical staff appropriately, able to participate in dressing change of right BKA 3/14 patient alert speaking in full sentences, sitting in bed eating lunch. Writhing in pain and indicating pain  is in his right BKA stump. 3/15 patient states the pain in his right BKA stump significantly decreased on the pain regimen he is on now. Negative N./V. states has been eating his meals today. States understands he has an infection in the right BKA stump and further surgery may be required. 3/16 patient able to answer some questions, and states understands the plan for right BKA stump revision.  Pain slightly increased over yesterday. 3/17 patient S/P HD, confused which is his baseline post HD   Objective: Filed Vitals:   10/10/13 1230 10/10/13 1303 10/10/13 1500 10/10/13 1700  BP: 123/91 136/86 125/86 146/87  Pulse: 83 82 85 83  Temp:  97.9 F (36.6 C) 98.3 F (36.8 C) 98.2 F (36.8 C)  TempSrc:  Oral Oral Oral  Resp:  18 23 22   Height:      Weight:      SpO2:  94% 98% 98%    Intake/Output Summary (Last 24 hours) at 10/10/13 1715 Last data filed at 10/10/13 1500  Gross per 24 hour  Intake    120 ml  Output   2897 ml  Net  -2777 ml   Filed Weights   10/07/13 1955 10/08/13 2044 10/10/13 0804  Weight: 83 kg (182 lb 15.7 oz) 84 kg (185 lb 3 oz) 88 kg (194 lb 0.1 oz)    Exam:   General:  A./O. x0 to 2 , follows all commands,    Cardiovascular: Regular rhythm and rate, negative murmurs rubs gallop  Respiratory: Clear to auscultation bilateral  Abdomen: Soft, nontender, nondistended plus bowel sound  Musculoskeletal: Bilateral BKA, left BKA performed November 2014 negative tenderness to palpation; right BKA performed February 2015 negative tenderness to palpation along the anterior/right lateral aspect of stump (patient on pain regimen currently). Negative discharge from right stump      Data Reviewed: Basic Metabolic Panel:  Recent Labs Lab 10/04/13 0429  10/06/13 0620 10/07/13 0825 10/08/13 0605 10/09/13 0525 10/10/13 0800  NA 135*  < > 139 139 140 141 141  K 4.2  < > 4.4 4.6 4.6 4.5 5.2  CL 88*  < > 95* 95* 97 98 98  CO2 28  < > 27 27 27 28 25   GLUCOSE 174*   < > 174* 184* 124* 109* 125*  BUN 44*  < > 29* 43* 21 30* 42*  CREATININE 5.80*  < > 4.36* 6.10* 3.84* 5.34* 6.61*  CALCIUM 10.5  < > 9.9 9.6 9.3 9.2 9.3  MG  --   < > 2.1 2.1 2.1 2.0 2.2  PHOS 5.1*  --   --   --   --   --  5.7*  < > = values in this interval not displayed. Liver Function Tests:  Recent Labs Lab 10/06/13 0620 10/07/13 0825 10/08/13 0605 10/09/13 0525 10/10/13 0800  AST 32 23 33 21 16  ALT 25 18 18 15 12   ALKPHOS 60 60 58 54 50  BILITOT 0.4 0.3 0.4 0.3 0.3  PROT 8.1 7.5 7.7 7.0 7.0  ALBUMIN 2.9* 2.7* 2.6* 2.3* 2.2*   No results found for this basename: LIPASE,  AMYLASE,  in the last 168 hours No results found for this basename: AMMONIA,  in the last 168 hours CBC:  Recent Labs Lab 10/06/13 0620 10/07/13 0825 10/08/13 0605 10/09/13 0525 10/09/13 1700 10/10/13 0800  WBC 8.5 9.2 7.0 7.6  --  7.3  NEUTROABS 5.5 5.8 4.1 4.7  --  4.3  HGB 11.3* 10.6* 11.0* 10.0* 10.1* 9.5*  HCT 34.7* 32.1* 34.4* 30.5* 30.8* 29.1*  MCV 84.2 83.2 86.0 84.5  --  83.6  PLT 310 342 330 348  --  367   Cardiac Enzymes: No results found for this basename: CKTOTAL, CKMB, CKMBINDEX, TROPONINI,  in the last 168 hours BNP (last 3 results)  Recent Labs  10/01/13 1135  PROBNP 1397.0*   CBG:  Recent Labs Lab 10/09/13 2149 10/10/13 10/10/13 0451 10/10/13 1352 10/10/13 1504  GLUCAP 135* 141* 114* 110* 156*    Recent Results (from the past 240 hour(s))  CULTURE, BLOOD (ROUTINE X 2)     Status: None   Collection Time    10/01/13 11:35 AM      Result Value Ref Range Status   Specimen Description BLOOD RIGHT WRIST   Final   Special Requests BOTTLES DRAWN AEROBIC ONLY 10CC   Final   Culture  Setup Time     Final   Value: 10/01/2013 19:00     Performed at [facility]   Culture     Final   Value: NO GROWTH 5 DAYS     Performed at [facility]   Report Status 10/07/2013 FINAL   Final  CULTURE, BLOOD (ROUTINE X 2)     Status: None   Collection Time     10/01/13 12:01 PM      Result Value Ref Range Status   Specimen Description BLOOD RIGHT FOREARM   Final   Special Requests BOTTLES DRAWN AEROBIC AND ANAEROBIC 5CC   Final   Culture  Setup Time     Final   Value: 10/01/2013 18:59     Performed at [facility]   Culture     Final   Value: NO GROWTH 5 DAYS     Performed at [facility]   Report Status 10/07/2013 FINAL   Final  CSF CULTURE     Status: None   Collection Time    10/02/13 10:55 AM      Result Value Ref Range Status   Specimen Description CSF   Final   Special Requests Normal   Final   Gram Stain     Final   Value: FEW WBC PRESENT, PREDOMINANTLY MONONUCLEAR     NO ORGANISMS SEEN     Performed at [facility]   Culture     Final   Value: NO GROWTH 3 DAYS     Performed at [facility]   Report Status 10/06/2013 FINAL   Final  GRAM STAIN     Status: None   Collection Time    10/02/13 10:55 AM      Result Value Ref Range Status   Specimen Description CSF   Final   Special Requests Normal   Final   Gram Stain     Final   Value: CYTOSPIN PREP     WBC PRESENT, PREDOMINANTLY MONONUCLEAR     NO ORGANISMS SEEN   Report Status 10/02/2013 FINAL   Final  MRSA PCR SCREENING     Status: None   Collection Time    10/02/13 11:18 PM  Result Value Ref Range Status   MRSA by PCR NEGATIVE  NEGATIVE Final   Comment:            The GeneXpert MRSA Assay (FDA     approved for NASAL specimens     only), is one component of a     comprehensive MRSA colonization     surveillance program. It is not     intended to diagnose MRSA     infection nor to guide or     monitor treatment for     MRSA infections.  CLOSTRIDIUM DIFFICILE BY PCR     Status: None   Collection Time    10/05/13  2:05 AM      Result Value Ref Range Status   C difficile by pcr NEGATIVE  NEGATIVE Final  CULTURE, BLOOD (ROUTINE X 2)     Status: None   Collection Time    10/05/13 10:42 PM      Result Value Ref Range Status    Specimen Description BLOOD RIGHT UPPER ARM   Final   Special Requests BOTTLES DRAWN AEROBIC AND ANAEROBIC 10CC   Final   Culture  Setup Time     Final   Value: 10/06/2013 03:56     Performed at [facility]   Culture     Final   Value:        BLOOD CULTURE RECEIVED NO GROWTH TO DATE CULTURE WILL BE HELD FOR 5 DAYS BEFORE ISSUING A FINAL NEGATIVE REPORT     Performed at [facility]   Report Status PENDING   Incomplete  CULTURE, BLOOD (ROUTINE X 2)     Status: None   Collection Time    10/05/13 10:56 PM      Result Value Ref Range Status   Specimen Description BLOOD RIGHT FOREARM   Final   Special Requests BOTTLES DRAWN AEROBIC AND ANAEROBIC 10CC   Final   Culture  Setup Time     Final   Value: 10/06/2013 03:56     Performed at [facility]   Culture     Final   Value:        BLOOD CULTURE RECEIVED NO GROWTH TO DATE CULTURE WILL BE HELD FOR 5 DAYS BEFORE ISSUING A FINAL NEGATIVE REPORT     Performed at [facility]   Report Status PENDING   Incomplete     Studies: No results found.  Scheduled Meds: . bisacodyl  5 mg Oral BID  . chlorhexidine  60 mL Topical Once  . collagenase   Topical Daily  . darbepoetin (ARANESP) injection - DIALYSIS  25 mcg Intravenous Q Tue-HD  . feeding supplement (NEPRO CARB STEADY)  237 mL Oral BID BM  . feeding supplement (PRO-STAT SUGAR FREE 64)  30 mL Oral BID  . heparin  5,000 Units Subcutaneous 3 times per day  . insulin aspart  0-20 Units Subcutaneous 6 times per day  . labetalol  300 mg Oral BID  . neomycin-bacitracin-polymyxin   Topical TID  . ondansetron (ZOFRAN) IV  4 mg Intravenous 3 times per day  . piperacillin-tazobactam (ZOSYN)  IV  2.25 g Intravenous 3 times per day  . sodium chloride  3 mL Intravenous Q12H  . [START ON 10/11/2013] vancomycin  1,000 mg Intravenous On Call to OR  . vancomycin  750 mg Intravenous Q T,Th,Sa-HD   Continuous Infusions: . dextrose 5 % and 0.9% NaCl 20 mL (10/08/13 1818)     Active  Problems:   HYPERTENSION   HEPATITIS C, HX OF   CEREBROVASCULAR ACCIDENT, HX OF   BENIGN PROSTATIC HYPERTROPHY   Hyperlipidemia   Anemia due to chronic illness   DM (diabetes mellitus) type I controlled with renal manifestation   Chronic combined systolic (EF 68%) and grade 2diastolic congestive heart failure   FTT (failure to thrive) in adult   Encephalopathy, metabolic   Dehydration   Fever    Time spent:40 minutes    Skipper Dacosta, J  [facility] Pager [phone]. If 7PM-7AM, please contact night-coverage at [email or web address], password [facility] 10/10/2013, 5:15 PM  LOS: 9 days

## 2013-10-10 NOTE — Progress Notes (Signed)
Patient ID: Oscar Castillo, male   DOB: 04/04/1951, 63 y.o.   MRN: [id number] Patient alert and oriented this morning. Plan for revision transtibial amputation of the right tomorrow morning. N.p.o. after midnight. 

## 2013-10-10 NOTE — Progress Notes (Signed)
NUTRITION FOLLOW-UP  DOCUMENTATION CODES Per approved criteria  -Not Applicable   INTERVENTION: Continue Nepro Shakes po BID. Add 30 ml Prostat po BID, each supplement provides 100 kcal and 15 grams protein. Recommend Rena-Vite daily. RD to continue to follow nutrition care plan.  NUTRITION DIAGNOSIS: Increased nutrient needs related to wound healing, ESRD as evidenced by estimated nutrition needs  Goal: Pt to meet >/= 90% of their estimated nutrition needs   Monitor:  PO & supplemental intake, weight, labs, I/O's  ASSESSMENT: 63 y.o. 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  BSE completed by SLP on 3/10 with recommendations for Dysphagia 1 diet with thin liquids. SLP continues to follow, notes indicating that he holds boluses needing cues to initiate a swallow. Suspect continued inadequate intake 2/2 cognition. Pt with orders for hand-feeding.  Per chart review, all work-up regarding infection came back negative, plan for MRI of R leg. MRI revealed abscess, plan for revision of stump tomorrow morning.  Team met with patient's wife, sister and brother-in-law who were in agreement to meeting with palliative care team. Meeting on 3/14 reveal wife desires pt to continue as Full Code until mental status improves.  RD saw patient during HD treatment. Pt able to answer simple yes and no questions. Continues on Dysphagia 1 diet with Nepro Shakes ordered BID. Pt states that he enjoys these supplements. Meal intake variable since admission, but overall <50%.  EDW per renal is 72.5 kg. Pt has required IVF this admit 2/2 poor po intake.  Potassium WNL, phos elevated at 5.7.  Height: 5' 9  (1.765 m)  Weight: Wt Readings from Last 1 Encounters:  10/10/13 194 lb 0.1 oz (88 kg)   BMI:  26.4 kg/m2 -- adjusted for bilateral BKA's  Estimated  Nutritional Needs: Kcal: 1900-2100 Protein: 100-110 gm Fluid: 1200 ml  Skin: Stage II pressure ulcer to sacrum  Diet Order: Dysphagia 1; thin  Labs:   Recent Labs Lab 10/04/13 0429  10/08/13 0605 10/09/13 0525 10/10/13 0800  NA 135*  < > 140 141 141  K 4.2  < > 4.6 4.5 5.2  CL 88*  < > 97 98 98  CO2 28  < > _0 BUN 44*  < > 21 30* 42*  CREATININE 5.80*  < > 3.84* 5.34* 6.61*  CALCIUM 10.5  < > 9.3 9.2 9.3  MG  --   < > 2.1 2.0 2.2  PHOS 5.1*  --   --   --  5.7*  GLUCOSE 174*  < > 124* 109* 125*  < > = values in this interval not displayed.  CBG (last 3)   Recent Labs  10/09/13 2149 10/10/13 10/10/13 0451  GLUCAP 135* 141* 114*    Scheduled Meds: . bisacodyl  5 mg Oral BID  . collagenase   Topical Daily  . darbepoetin (ARANESP) injection - DIALYSIS  25 mcg Intravenous Q Tue-HD  . feeding supplement (NEPRO CARB STEADY)  237 mL Oral BID BM  . heparin  5,000 Units Subcutaneous 3 times per day  . insulin aspart  0-20 Units Subcutaneous 6 times per day  . labetalol  300 mg Oral BID  . neomycin-bacitracin-polymyxin   Topical TID  . ondansetron (ZOFRAN) IV  4 mg Intravenous 3 times per day  . piperacillin-tazobactam (ZOSYN)  IV  2.25 g Intravenous 3 times  per day  . sodium chloride  3 mL Intravenous Q12H  . vancomycin  750 mg Intravenous Q T,Th,Sa-HD    Continuous Infusions: . dextrose 5 % and 0.9% NaCl 20 mL (10/08/13 1818)    Inda Coke MS, RD, LDN Inpatient Registered Dietitian Pager: [phone] After-hours pager: [phone]

## 2013-10-11 ENCOUNTER — Encounter (HOSPITAL_COMMUNITY): Payer: Self-pay | Admitting: Certified Registered Nurse Anesthetist

## 2013-10-11 ENCOUNTER — Inpatient Hospital Stay (HOSPITAL_COMMUNITY): Payer: Medicare Other | Admitting: Anesthesiology

## 2013-10-11 ENCOUNTER — Encounter (HOSPITAL_COMMUNITY): Payer: Medicare Other | Admitting: Anesthesiology

## 2013-10-11 ENCOUNTER — Encounter (HOSPITAL_COMMUNITY)
Admission: EM | Disposition: A | Discharge status: Nursing Home (Includes ICF) | Payer: Medicare Other | Source: Home / Self Care | Attending: Internal Medicine

## 2013-10-11 HISTORY — PX: AMPUTATION: SHX166

## 2013-10-11 LAB — GLUCOSE, CAPILLARY
GLUCOSE-CAPILLARY: 105 mg/dL — AB (ref 70–99)
GLUCOSE-CAPILLARY: 96 mg/dL (ref 70–99)
GLUCOSE-CAPILLARY: 88 mg/dL (ref 70–99)
Glucose-Capillary: 112 mg/dL — ABNORMAL HIGH (ref 70–99)
Glucose-Capillary: 90 mg/dL (ref 70–99)
Glucose-Capillary: 119 mg/dL — ABNORMAL HIGH (ref 70–99)
Glucose-Capillary: 144 mg/dL — ABNORMAL HIGH (ref 70–99)
Glucose-Capillary: 116 mg/dL — ABNORMAL HIGH (ref 70–99)

## 2013-10-11 LAB — COMPREHENSIVE METABOLIC PANEL
ALBUMIN: 2.3 g/dL — AB (ref 3.5–5.2)
ALK PHOS: 58 U/L (ref 39–117)
ALT: 14 U/L (ref 0–53)
AST: 22 U/L (ref 0–37)
BILIRUBIN TOTAL: 0.3 mg/dL (ref 0.3–1.2)
BUN: 19 mg/dL (ref 6–23)
CHLORIDE: 96 meq/L (ref 96–112)
CO2: 25 mEq/L (ref 19–32)
Calcium: 8.9 mg/dL (ref 8.4–10.5)
Creatinine, Ser: 3.99 mg/dL — ABNORMAL HIGH (ref 0.50–1.35)
GFR calc Af Amer: 17 mL/min — ABNORMAL LOW (ref >90)
GFR calc non Af Amer: 15 mL/min — ABNORMAL LOW (ref >90)
Glucose, Bld: 98 mg/dL (ref 70–99)
POTASSIUM: 4.5 meq/L (ref 3.7–5.3)
SODIUM: 139 meq/L (ref 137–147)
Total Protein: 7.6 g/dL (ref 6.0–8.3)

## 2013-10-11 LAB — CBC WITH DIFFERENTIAL/PLATELET
BASOS ABS: 0 10*3/uL (ref 0.0–0.1)
Basophils Relative: 0 % (ref 0–1)
EOS PCT: 2 % (ref 0–5)
Eosinophils Absolute: 0.1 10*3/uL (ref 0.0–0.7)
HEMATOCRIT: 32.9 % — AB (ref 39.0–52.0)
Hemoglobin: 10.7 g/dL — ABNORMAL LOW (ref 13.0–17.0)
LYMPHS ABS: 1.9 10*3/uL (ref 0.7–4.0)
Lymphocytes Relative: 32 % (ref 12–46)
MCH: 27.5 pg (ref 26.0–34.0)
MCHC: 32.5 g/dL (ref 30.0–36.0)
MCV: 84.6 fL (ref 78.0–100.0)
MONO ABS: 0.5 10*3/uL (ref 0.1–1.0)
Monocytes Relative: 8 % (ref 3–12)
Neutro Abs: 3.4 10*3/uL (ref 1.7–7.7)
Neutrophils Relative %: 58 % (ref 43–77)
Platelets: 371 10*3/uL (ref 150–400)
RBC: 3.89 MIL/uL — ABNORMAL LOW (ref 4.22–5.81)
RDW: 19.3 % — AB (ref 11.5–15.5)
WBC: 6 10*3/uL (ref 4.0–10.5)

## 2013-10-11 LAB — MAGNESIUM: Magnesium: 1.9 mg/dL (ref 1.5–2.5)

## 2013-10-11 SURGERY — AMPUTATION BELOW KNEE
Anesthesia: General | Site: Leg Lower | Laterality: Right

## 2013-10-11 MED ORDER — ONDANSETRON HCL 4 MG PO TABS
4.0000 mg | ORAL_TABLET | Freq: Four times a day (QID) | ORAL | Status: DC | PRN
Start: 2013-10-11 — End: 2013-10-31
  Administered 2013-10-31: 4 mg via ORAL
  Filled 2013-10-11 (×2): qty 1

## 2013-10-11 MED ORDER — HYDROMORPHONE HCL PF 1 MG/ML IJ SOLN
0.2500 mg | INTRAMUSCULAR | Status: DC | PRN
  Administered 2013-10-11: 0.5 mg via INTRAVENOUS
  Administered 2013-10-11 (×2): 0.25 mg via INTRAVENOUS

## 2013-10-11 MED ORDER — ONDANSETRON HCL 4 MG/2ML IJ SOLN
INTRAMUSCULAR | Status: AC
  Filled 2013-10-11: qty 2

## 2013-10-11 MED ORDER — LIDOCAINE HCL (CARDIAC) 20 MG/ML IV SOLN
INTRAVENOUS | Status: AC
  Filled 2013-10-11: qty 5

## 2013-10-11 MED ORDER — PROMETHAZINE HCL 25 MG/ML IJ SOLN: 6.2500 mg | INTRAMUSCULAR | Status: DC | PRN

## 2013-10-11 MED ORDER — HYDROMORPHONE HCL PF 1 MG/ML IJ SOLN
INTRAMUSCULAR | Status: AC
  Administered 2013-10-11: 0.25 mg via INTRAVENOUS
  Filled 2013-10-11: qty 1

## 2013-10-11 MED ORDER — FENTANYL CITRATE 0.05 MG/ML IJ SOLN
INTRAMUSCULAR | Status: DC | PRN
  Administered 2013-10-11: 50 ug via INTRAVENOUS

## 2013-10-11 MED ORDER — OXYCODONE HCL 5 MG PO TABS: 5.0000 mg | ORAL_TABLET | Freq: Once | ORAL | Status: DC | PRN

## 2013-10-11 MED ORDER — PROPOFOL 10 MG/ML IV BOLUS
INTRAVENOUS | Status: DC | PRN
Start: 2013-10-11 — End: 2013-10-11
  Administered 2013-10-11: 110 mg via INTRAVENOUS

## 2013-10-11 MED ORDER — ONDANSETRON HCL 4 MG/2ML IJ SOLN
INTRAMUSCULAR | Status: DC | PRN
  Administered 2013-10-11: 4 mg via INTRAVENOUS

## 2013-10-11 MED ORDER — LIDOCAINE HCL (CARDIAC) 20 MG/ML IV SOLN
INTRAVENOUS | Status: DC | PRN
  Administered 2013-10-11: 80 mg via INTRAVENOUS

## 2013-10-11 MED ORDER — FENTANYL CITRATE 0.05 MG/ML IJ SOLN
INTRAMUSCULAR | Status: AC
  Filled 2013-10-11: qty 5

## 2013-10-11 MED ORDER — RENA-VITE PO TABS
1.0000 | ORAL_TABLET | Freq: Every day | ORAL | Status: DC
  Administered 2013-10-12: 23:00:00 via ORAL
  Administered 2013-10-15 – 2013-10-19 (×4): 1 via ORAL
  Administered 2013-10-20 – 2013-10-22 (×3): via ORAL
  Administered 2013-10-23 – 2013-10-30 (×8): 1 via ORAL
  Filled 2013-10-11 (×22): qty 1

## 2013-10-11 MED ORDER — METOCLOPRAMIDE HCL 5 MG PO TABS: 5.0000 mg | ORAL_TABLET | Freq: Three times a day (TID) | ORAL | Status: DC | PRN

## 2013-10-11 MED ORDER — 0.9 % SODIUM CHLORIDE (POUR BTL) OPTIME
TOPICAL | Status: DC | PRN
  Administered 2013-10-11: 1000 mL

## 2013-10-11 MED ORDER — ONDANSETRON HCL 4 MG/2ML IJ SOLN
4.0000 mg | Freq: Four times a day (QID) | INTRAMUSCULAR | Status: DC | PRN
  Administered 2013-10-14: 4 mg via INTRAVENOUS
  Filled 2013-10-11 (×5): qty 2

## 2013-10-11 MED ORDER — MIDAZOLAM HCL 2 MG/2ML IJ SOLN
INTRAMUSCULAR | Status: AC
  Filled 2013-10-11: qty 2

## 2013-10-11 MED ORDER — METOCLOPRAMIDE HCL 5 MG/ML IJ SOLN: 5.0000 mg | Freq: Three times a day (TID) | INTRAMUSCULAR | Status: DC | PRN

## 2013-10-11 MED ORDER — OXYCODONE HCL 5 MG/5ML PO SOLN
5.0000 mg | Freq: Once | ORAL | Status: DC | PRN
Start: 2013-10-11 — End: 2013-10-11

## 2013-10-11 MED ORDER — PHENYLEPHRINE HCL 10 MG/ML IJ SOLN
INTRAMUSCULAR | Status: DC | PRN
  Administered 2013-10-11: 80 ug via INTRAVENOUS

## 2013-10-11 SURGICAL SUPPLY — 47 items
BANDAGE ESMARK 6X9 LF (GAUZE/BANDAGES/DRESSINGS) ×1 IMPLANT
BANDAGE GAUZE ELAST BULKY 4 IN (GAUZE/BANDAGES/DRESSINGS) ×6 IMPLANT
BLADE SAW RECIP 87.9 MT (BLADE) ×3 IMPLANT
BLADE SURG 21 STRL SS (BLADE) ×3 IMPLANT
BNDG CMPR 9X6 STRL LF SNTH (GAUZE/BANDAGES/DRESSINGS) ×1
BNDG COHESIVE 6X5 TAN STRL LF (GAUZE/BANDAGES/DRESSINGS) ×4 IMPLANT
BNDG ESMARK 6X9 LF (GAUZE/BANDAGES/DRESSINGS) ×3
BNDG GAUZE ELAST 4 BULKY (GAUZE/BANDAGES/DRESSINGS) ×2 IMPLANT
COVER SURGICAL LIGHT HANDLE (MISCELLANEOUS) ×3 IMPLANT
CUFF TOURNIQUET SINGLE 34IN LL (TOURNIQUET CUFF) IMPLANT
CUFF TOURNIQUET SINGLE 44IN (TOURNIQUET CUFF) IMPLANT
DRAIN PENROSE 1/2X12 LTX STRL (WOUND CARE) IMPLANT
DRAPE EXTREMITY T 121X128X90 (DRAPE) ×3 IMPLANT
DRAPE PROXIMA HALF (DRAPES) ×6 IMPLANT
DRAPE U-SHAPE 47X51 STRL (DRAPES) ×6 IMPLANT
DRSG ADAPTIC 3X8 NADH LF (GAUZE/BANDAGES/DRESSINGS) ×3 IMPLANT
DRSG PAD ABDOMINAL 8X10 ST (GAUZE/BANDAGES/DRESSINGS) ×3 IMPLANT
DURAPREP 26ML APPLICATOR (WOUND CARE) ×3 IMPLANT
ELECT REM PT RETURN 9FT ADLT (ELECTROSURGICAL) ×3
ELECTRODE REM PT RTRN 9FT ADLT (ELECTROSURGICAL) ×1 IMPLANT
GLOVE BIOGEL PI IND STRL 7.5 (GLOVE) IMPLANT
GLOVE BIOGEL PI IND STRL 9 (GLOVE) ×1 IMPLANT
GLOVE BIOGEL PI INDICATOR 7.5 (GLOVE) ×2
GLOVE BIOGEL PI INDICATOR 9 (GLOVE) ×2
GLOVE SURG ORTHO 9.0 STRL STRW (GLOVE) ×3 IMPLANT
GOWN STRL REUS W/ TWL XL LVL3 (GOWN DISPOSABLE) ×2 IMPLANT
GOWN STRL REUS W/TWL XL LVL3 (GOWN DISPOSABLE) ×6
KIT BASIN OR (CUSTOM PROCEDURE TRAY) ×3 IMPLANT
KIT ROOM TURNOVER OR (KITS) ×3 IMPLANT
MANIFOLD NEPTUNE II (INSTRUMENTS) ×3 IMPLANT
NS IRRIG 1000ML POUR BTL (IV SOLUTION) ×3 IMPLANT
PACK GENERAL/GYN (CUSTOM PROCEDURE TRAY) ×3 IMPLANT
PAD ABD 8X10 STRL (GAUZE/BANDAGES/DRESSINGS) ×2 IMPLANT
PAD ARMBOARD 7.5X6 YLW CONV (MISCELLANEOUS) ×6 IMPLANT
SPONGE GAUZE 4X4 12PLY (GAUZE/BANDAGES/DRESSINGS) ×3 IMPLANT
SPONGE LAP 18X18 X RAY DECT (DISPOSABLE) IMPLANT
STAPLER VISISTAT 35W (STAPLE) IMPLANT
STOCKINETTE IMPERVIOUS LG (DRAPES) ×3 IMPLANT
SUT ETHILON 2 0 PSLX (SUTURE) ×4 IMPLANT
SUT PDS AB 1 CT  36 (SUTURE)
SUT PDS AB 1 CT 36 (SUTURE) IMPLANT
SUT SILK 2 0 (SUTURE) ×3
SUT SILK 2-0 18XBRD TIE 12 (SUTURE) ×1 IMPLANT
TOWEL OR 17X24 6PK STRL BLUE (TOWEL DISPOSABLE) ×3 IMPLANT
TOWEL OR 17X26 10 PK STRL BLUE (TOWEL DISPOSABLE) ×3 IMPLANT
TUBE ANAEROBIC SPECIMEN COL (MISCELLANEOUS) IMPLANT
WATER STERILE IRR 1000ML POUR (IV SOLUTION) ×3 IMPLANT

## 2013-10-11 NOTE — Anesthesia Preprocedure Evaluation (Addendum)
Anesthesia Evaluation  Patient identified by MRN, date of birth, ID band Patient confused    Reviewed: Allergy & Precautions, H&P , NPO status , Patient's Chart, lab work & pertinent test results  History of Anesthesia Complications (+) history of anesthetic complications  Airway Mallampati: II TM Distance: >3 FB Neck ROM: Full    Dental  (+) Edentulous Upper, Edentulous Lower   Pulmonary neg pulmonary ROS,    Pulmonary exam normal       Cardiovascular hypertension, +CHF     Neuro/Psych PSYCHIATRIC DISORDERS Depression TIACVA    GI/Hepatic Neg liver ROS, GERD-  ,  Endo/Other  diabetes  Renal/GU ESRF and DialysisRenal diseasenegative Renal ROS     Musculoskeletal   Abdominal   Peds  Hematology   Anesthesia Other Findings   Reproductive/Obstetrics                          Anesthesia Physical Anesthesia Plan  ASA: III  Anesthesia Plan: General   Post-op Pain Management:    Induction: Intravenous  Airway Management Planned: LMA  Additional Equipment:   Intra-op Plan:   Post-operative Plan: Extubation in OR  Informed Consent: I have reviewed the patients History and Physical, chart, labs and discussed the procedure including the risks, benefits and alternatives for the proposed anesthesia with the patient or authorized representative who has indicated his/her understanding and acceptance.     Plan Discussed with: CRNA, Anesthesiologist and Surgeon  Anesthesia Plan Comments:        Anesthesia Quick Evaluation

## 2013-10-11 NOTE — Progress Notes (Signed)
[facility] PROGRESS NOTE  Oscar Castillo JAS:[id number] DOB: Jul 17, 1951 DOA: 10/01/2013 PCP: [name], MD  Assessment/Plan:  Encephalopathy, metabolic  -Suspect dehydration and possible underlying infectious processes as etiology - reassess daily  New LBBB  -has been transient in nature  -systolic function normal but septal motion with abnormal function and dyssynergy  -cardiac enzymes negative  -doubt pt appropriate for invasive cardiac work up   Dehydration/FTT (failure to thrive) in adult  - Speech and Land (SLP) attempted 10/02/2013 the patient unable to arouse enough to eval but later re examined and now on D1 diet    Fever  -Unclear etiology- suspect DH and atelectasis  -He did have leukocytosis so continue empiric antibiotics as precaution  -Patient unable to contribute to history and given altered mentation and fever an LP was done to rule out possible meningitis-CSF studies pending-partial results with 35 of protein and 156 of glucose so not indicative of bacterial meningitis-CSF culture no growth  -Influenza PCR negative  -Medication reconciliation list that patient was on doxycycline prior to admission which was started on 09/29/2013 or suspected postoperative wound infection/possible osteomyelitis and scheduled to go for 3 weeks  -Blood cultures no growth to date urinalysis unremarkable  -3/14 Right BKA stump shows abscess/osteomyelitis by MRI;; see results below  -s/p  abscess drainage/revision of right BKA stump on 3/18   HYPERTENSION  -Although blood pressure not within AHA guidelines will allow permissive HTN given patient's waxing and waning cognitive state, poor perfusion secondary to severe PVD  -Continue labetalol 300 mg BID  -Continue hydralazine 5 mg PRN SBP> 160 or DBP> 100   Anemia due to chronic illness  -Hemoglobin has trended down to 13 after hydration noting baseline just under 11  -3/17 Monitor closely currently at 9.5, repeat  H./H.  -3/17 type and cross. With patient having revision in a.m. of right BKA will most likely need post surgical transfusion (would use hemoconcentrated PRBC)   DM (diabetes mellitus) type I controlled with renal manifestation  -On Lantus prior to admission  -Patient has begun to eat at least partial to all portions of his meals. Being supplemented by Ensure between meals.  -Continue resistant SSI; CBG currently controlled on this regimen   CKD (chronic kidney disease) stage V requiring chronic dialysis  -Usual dialysis days Tuesday Thursday Saturday  -No ultrafiltration secondary to ongoing dehydration   Chronic combined systolic (EF 41%) and grade 2 diastolic congestive heart failure  -Compensated and managed with dialysis on T/Th/Sat   HEPATITIS C, HX OF  CEREBROVASCULAR ACCIDENT, HX OF  -No evidence of acute infarct   Hyperlipidemia  -stable  Right BKA  -Pain is increasing since surgery in February 2015. MRI 3/13 shows pathology see results below  -3/14 MRI of right BKA stump shows abscess/osteomyelitis see results below   Osteomyelitis right BKA stump  - Please see above.   Code Status: Full  Family Communication: Wife and sister present for discussion of plan of care  Disposition Plan: We will await recommendations from palliative care    Consultants:  Nephrology   Palliative  Orthopaedic  Procedures:  As listed above  Antibiotics:  Vanc and zosyn  HPI/Subjective: Pt has no new complaints. No acute issues reported overnight.  Objective: Filed Vitals:   10/11/13 1153  BP: 154/84  Pulse: 77  Temp: 98.2 F (36.8 C)  Resp: 13    Intake/Output Summary (Last 24 hours) at 10/11/13 1645 Last data filed at 10/11/13 1415  Gross per 24  hour  Intake    740 ml  Output     50 ml  Net    690 ml   Filed Weights   10/08/13 2044 10/10/13 0804 10/10/13 2120  Weight: 84 kg (185 lb 3 oz) 88 kg (194 lb 0.1 oz) 87 kg (191 lb 12.8 oz)    Exam:   General:   Pt in NAD, Alert and awake  Cardiovascular: RRR, no mrg  Respiratory: CTA BL, no wheezes, no increased wob  Abdomen: soft, ND, NT  Musculoskeletal: no cyanosis at upper extremities.   Data Reviewed: Basic Metabolic Panel:  Recent Labs Lab 10/07/13 0825 10/08/13 0605 10/09/13 0525 10/10/13 0800 10/11/13 0528  NA 139 140 141 141 139  K 4.6 4.6 4.5 5.2 4.5  CL 95* 97 98 98 96  CO2 27 27 28 25 25   GLUCOSE 184* 124* 109* 125* 98  BUN 43* 21 30* 42* 19  CREATININE 6.10* 3.84* 5.34* 6.61* 3.99*  CALCIUM 9.6 9.3 9.2 9.3 8.9  MG 2.1 2.1 2.0 2.2 1.9  PHOS  --   --   --  5.7*  --    Liver Function Tests:  Recent Labs Lab 10/07/13 0825 10/08/13 0605 10/09/13 0525 10/10/13 0800 10/11/13 0528  AST 23 33 21 16 22   ALT 18 18 15 12 14   ALKPHOS 60 58 54 50 58  BILITOT 0.3 0.4 0.3 0.3 0.3  PROT 7.5 7.7 7.0 7.0 7.6  ALBUMIN 2.7* 2.6* 2.3* 2.2* 2.3*   No results found for this basename: LIPASE, AMYLASE,  in the last 168 hours No results found for this basename: AMMONIA,  in the last 168 hours CBC:  Recent Labs Lab 10/07/13 0825 10/08/13 0605 10/09/13 0525 10/09/13 1700 10/10/13 0800 10/11/13 0528  WBC 9.2 7.0 7.6  --  7.3 6.0  NEUTROABS 5.8 4.1 4.7  --  4.3 3.4  HGB 10.6* 11.0* 10.0* 10.1* 9.5* 10.7*  HCT 32.1* 34.4* 30.5* 30.8* 29.1* 32.9*  MCV 83.2 86.0 84.5  --  83.6 84.6  PLT 342 330 348  --  367 371   Cardiac Enzymes: No results found for this basename: CKTOTAL, CKMB, CKMBINDEX, TROPONINI,  in the last 168 hours BNP (last 3 results)  Recent Labs  10/01/13 1135  PROBNP 1397.0*   CBG:  Recent Labs Lab 10/11/13 0409 10/11/13 0744 10/11/13 1058 10/11/13 1208 10/11/13 1242  GLUCAP 105* 96 90 119* 88    Recent Results (from the past 240 hour(s))  CSF CULTURE     Status: None   Collection Time    10/02/13 10:55 AM      Result Value Ref Range Status   Specimen Description CSF   Final   Special Requests Normal   Final   Gram Stain     Final    Value: FEW WBC PRESENT, PREDOMINANTLY MONONUCLEAR     NO ORGANISMS SEEN     Performed at [facility]   Culture     Final   Value: NO GROWTH 3 DAYS     Performed at [facility]   Report Status 10/06/2013 FINAL   Final  GRAM STAIN     Status: None   Collection Time    10/02/13 10:55 AM      Result Value Ref Range Status   Specimen Description CSF   Final   Special Requests Normal   Final   Gram Stain     Final   Value: CYTOSPIN PREP  WBC PRESENT, PREDOMINANTLY MONONUCLEAR     NO ORGANISMS SEEN   Report Status 10/02/2013 FINAL   Final  MRSA PCR SCREENING     Status: None   Collection Time    10/02/13 11:18 PM      Result Value Ref Range Status   MRSA by PCR NEGATIVE  NEGATIVE Final   Comment:            The GeneXpert MRSA Assay (FDA     approved for NASAL specimens     only), is one component of a     comprehensive MRSA colonization     surveillance program. It is not     intended to diagnose MRSA     infection nor to guide or     monitor treatment for     MRSA infections.  CLOSTRIDIUM DIFFICILE BY PCR     Status: None   Collection Time    10/05/13  2:05 AM      Result Value Ref Range Status   C difficile by pcr NEGATIVE  NEGATIVE Final  CULTURE, BLOOD (ROUTINE X 2)     Status: None   Collection Time    10/05/13 10:42 PM      Result Value Ref Range Status   Specimen Description BLOOD RIGHT UPPER ARM   Final   Special Requests BOTTLES DRAWN AEROBIC AND ANAEROBIC 10CC   Final   Culture  Setup Time     Final   Value: 10/06/2013 03:56     Performed at [facility]   Culture     Final   Value:        BLOOD CULTURE RECEIVED NO GROWTH TO DATE CULTURE WILL BE HELD FOR 5 DAYS BEFORE ISSUING A FINAL NEGATIVE REPORT     Performed at [facility]   Report Status PENDING   Incomplete  CULTURE, BLOOD (ROUTINE X 2)     Status: None   Collection Time    10/05/13 10:56 PM      Result Value Ref Range Status   Specimen Description BLOOD RIGHT  FOREARM   Final   Special Requests BOTTLES DRAWN AEROBIC AND ANAEROBIC 10CC   Final   Culture  Setup Time     Final   Value: 10/06/2013 03:56     Performed at [facility]   Culture     Final   Value:        BLOOD CULTURE RECEIVED NO GROWTH TO DATE CULTURE WILL BE HELD FOR 5 DAYS BEFORE ISSUING A FINAL NEGATIVE REPORT     Performed at [facility]   Report Status PENDING   Incomplete     Studies: No results found.  Scheduled Meds: . bisacodyl  5 mg Oral BID  . darbepoetin (ARANESP) injection - DIALYSIS  25 mcg Intravenous Q Tue-HD  . feeding supplement (NEPRO CARB STEADY)  237 mL Oral BID BM  . feeding supplement (PRO-STAT SUGAR FREE 64)  30 mL Oral BID  . heparin  5,000 Units Subcutaneous 3 times per day  . insulin aspart  0-20 Units Subcutaneous 6 times per day  . labetalol  300 mg Oral BID  . multivitamin  1 tablet Oral QHS  . neomycin-bacitracin-polymyxin   Topical TID  . ondansetron (ZOFRAN) IV  4 mg Intravenous 3 times per day  . piperacillin-tazobactam (ZOSYN)  IV  2.25 g Intravenous 3 times per day  . sodium chloride  3 mL Intravenous Q12H  . vancomycin  750  mg Intravenous Q T,Th,Sa-HD   Continuous Infusions: . dextrose 5 % and 0.9% NaCl 20 mL (10/08/13 1818)    Active Problems:   HYPERTENSION   HEPATITIS C, HX OF   CEREBROVASCULAR ACCIDENT, HX OF   BENIGN PROSTATIC HYPERTROPHY   Hyperlipidemia   Anemia due to chronic illness   DM (diabetes mellitus) type I controlled with renal manifestation   Chronic combined systolic (EF [id number]) and grade 2diastolic congestive heart failure   FTT (failure to thrive) in adult   Encephalopathy, metabolic   Dehydration   Fever    Time spent: > 35 minutes    Velvet Bathe  [facility] Pager [phone]. If 7PM-7AM, please contact night-coverage at [email or web address], password [facility] 10/11/2013, 4:45 PM  LOS: 10 days

## 2013-10-11 NOTE — Anesthesia Procedure Notes (Signed)
Procedure Name: LMA Insertion Date/Time: 10/11/2013 10:21 AM Performed by: Raphael Gibney T Pre-anesthesia Checklist: Patient identified, Timeout performed, Emergency Drugs available, Suction available and Patient being monitored Patient Re-evaluated:Patient Re-evaluated prior to inductionOxygen Delivery Method: Circle system utilized and Simple face mask Preoxygenation: Pre-oxygenation with 100% oxygen Intubation Type: IV induction LMA: LMA inserted LMA Size: 5.0 Number of attempts: 1 Airway Equipment and Method: Patient positioned with wedge pillow Tube secured with: Tape Dental Injury: Teeth and Oropharynx as per pre-operative assessment

## 2013-10-11 NOTE — Anesthesia Postprocedure Evaluation (Signed)
Anesthesia Post Note  Patient: Oscar Castillo  Procedure(s) Performed: Procedure(s) (LRB): AMPUTATION BELOW KNEE (Right)  Anesthesia type: general  Patient location: PACU  Post pain: Pain level controlled  Post assessment: Patient's Cardiovascular Status Stable  Last Vitals:  Filed Vitals:   10/11/13 1153  BP: 154/84  Pulse: 77  Temp: 36.8 C  Resp: 13    Post vital signs: Reviewed and stable  Level of consciousness: sedated  Complications: No apparent anesthesia complications

## 2013-10-11 NOTE — Progress Notes (Signed)
I saw the patient and agree with the above assessment and plan.

## 2013-10-11 NOTE — Progress Notes (Signed)
PT Cancellation Note  Patient Details Name: SHAWNN BOUILLON MRN: [id number] DOB: [birth year]   Cancelled Treatment:    Reason Eval/Treat Not Completed: Patient at procedure or test/unavailable; for BKA revision today.  Will attempt to see tomorrow.   Melysa Schroyer,CYNDI 10/11/2013, 8:58 AM

## 2013-10-11 NOTE — Progress Notes (Signed)
Patients stump incision was noted to have moderate bloody drainage on compression wrap and bed sheets. RN at Dr Jess Barters was notified and stated to change/reinforce the dressing. Will continue to observe and mark drainage.

## 2013-10-11 NOTE — H&P (View-Only) (Signed)
Patient ID: Oscar Castillo, male   DOB: Dec 03, 1950, 63 y.o.   MRN: [id number] Patient alert and oriented this morning. Plan for revision transtibial amputation of the right tomorrow morning. N.p.o. after midnight.

## 2013-10-11 NOTE — Interval H&P Note (Signed)
History and Physical Interval Note:  10/11/2013 6:15 AM  Oscar Castillo  has presented today for surgery, with the diagnosis of Osteomyelitis/Abscess Right BKA  The various methods of treatment have been discussed with the patient and family. After consideration of risks, benefits and other options for treatment, the patient has consented to  Procedure(s) with comments: AMPUTATION BELOW KNEE (Right) - Right Below Knee Amputation Revision as a surgical intervention .  The patient's history has been reviewed, patient examined, no change in status, stable for surgery.  I have reviewed the patient's chart and labs.  Questions were answered to the patient's satisfaction.     DUDA,MARCUS V

## 2013-10-11 NOTE — Transfer of Care (Signed)
Immediate Anesthesia Transfer of Care Note  Patient: Oscar Castillo  Procedure(s) Performed: Procedure(s) with comments: AMPUTATION BELOW KNEE (Right) - Right Below Knee Amputation Revision  Patient Location: PACU  Anesthesia Type:General  Level of Consciousness: awake  Airway & Oxygen Therapy: Patient Spontanous Breathing and Patient connected to face mask oxygen  Post-op Assessment: Report given to PACU RN, Post -op Vital signs reviewed and stable and Patient moving all extremities X 4  Post vital signs: Reviewed and stable  Complications: No apparent anesthesia complications

## 2013-10-11 NOTE — Op Note (Signed)
OPERATIVE REPORT  DATE OF SURGERY: 10/11/2013  PATIENT:  Oscar Castillo,  63 y.o. male  PRE-OPERATIVE DIAGNOSIS:  Osteomyelitis/Abscess Right BKA  POST-OPERATIVE DIAGNOSIS:  Osteomyelitis/Abscess Right BKA  PROCEDURE:  Procedure(s): AMPUTATION BELOW KNEE revision  SURGEON:  Surgeon(s): Newt Minion, MD  ANESTHESIA:   general  EBL:  Minimal ML  SPECIMEN:  No Specimen  TOURNIQUET:  * No tourniquets in log *  PROCEDURE DETAILS: The patient is a 63 year old gentleman diabetes end-stage renal disease severe peripheral vascular disease who has abscess and gangrenous changes to the right transtibial amputation. Patient presented this time for revision amputation. Risks and benefits were discussed including risk of persistent infection nonhealing of the wound need for additional surgery. Patient states he understands and wished to proceed at this time. This was also discussed in depth with his wife and she agrees. Description of procedure patient was brought to the operating room and underwent a general anesthetic. After adequate levels of anesthesia were obtained patient's right lower extremity was prepped using DuraPrep draped into a sterile field. A fishmouth incision was made around the necrotic wound. The distal 3 cm of the tibia and fibular were resected. The neurovascular is were ligated with 2-0 silk. There was good petechial bleeding in all muscles with good contractility. The incision was closed using 2-0 nylon there was no tension the skin. The wound was covered with Adaptic orthopedic sponges AB dressing Kerlix and Coban. Patient was extubated taken to the PACU in stable condition.  PLAN OF CARE: Admit to inpatient   PATIENT DISPOSITION:  PACU - hemodynamically stable.   Newt Minion, MD 10/11/2013 10:50 AM

## 2013-10-11 NOTE — Progress Notes (Signed)
Palliative Medicine Team Progress Note  Noted plans for surgical revision on Wednesday-patient has been mostly confused, but has intermittent periods of lucidity-I am still not able to have a complex conversation with him and he does not have capacity for his own decision making in his current condition. Wife unable to make any decisions regarding his code status or goals of care at initial consultation. I discussed in detail QOL issues ahead but wife feels that he will get better and get a prothesis and be able to go home- I explained that time line and how that is probably not a realistic goal along with transporting him to HD. For now the hope is once he has this abscess drained, requiring less pain medication and infection is improved that he could actually make these decisions- I am hopeful this will happen for his wellbeing and to help his wife who is struggling with this- but prolonged delirium/encephalopathy has an overall poor prognosis for full recovery. Our team will continue to follow post-op for continued conversations and in goal setting and pain mansgement.  Lane Hacker, DO Palliative Medicine

## 2013-10-11 NOTE — Progress Notes (Signed)
Subjective:   For R BKA today / tolerated hd yesterday/ no voiced cos /awoken from sleep  No changes in  MS with pleasant confusion Objective Vital signs in last 24 hours: Filed Vitals:   10/10/13 1700 10/10/13 2120 10/11/13 0612 10/11/13 0806  BP: 146/87 117/96 161/86 154/81  Pulse: 83 85 81 85  Temp: 98.2 F (36.8 C) 97.8 F (36.6 C) 98.2 F (36.8 C) 98.4 F (36.9 C)  TempSrc: Oral Axillary Oral Oral  Resp: 22 20 18 18   Height:      Weight:  87 kg (191 lb 12.8 oz)    SpO2: 98% 100% 100% 100%  Physical Exam  General:  Awoken  Pleasantly  Confused   Heart: RRR no rub or mur. Lungs: grossly clear without rales or wheezes  Abdomen: soft NT, ND Extremities: right BKA ACE wrapped; left BKA with scabbed areas, no drainage  Dialysis Access: left upper AVF + bruit   [facility]  4h 63min 72.5kg 2/2.0 Bath LUA AVF Heparin 5000 / 2400  Hectorol 1 Epo none Venofer none   Assessment/Plan:  1. AMS - basically unchanged; alert  And no meaningful conversation  2. ESRD - TTS - HD per routine  3. Anemia - Hgb 10,7; dose Aranesp 25 q Tuesday hd 4. Secondary hyperparathyroidism - corrected Ca 10.2 -  5.7 phos no binders  Use crushed Fosrenol /use 2 Ca bath; hectorol being held -  5. HTN/volume - volume ^^^ by bed weights; 100 % o2 sat lungs CTA/doubt accuracy; UF ib=n as toleratred 6. Nutrition - poor intake variable; discussed with nursing; poor intake recorded; needs to be fed; has nepro between meals  7. Fever - stump wound - for surgical revision today- on Vanc and Zosyn  8. PVD with bilateral BKAs  9. Hx CVA  10. Type 2 DM -D5 NS at 20/hr - poor intake  11. Hep C + LFT ok  12. FTT/debiliatated/from SNF - palliative care has seen - still full Code; needs snh /Palliatie care dw  wife [place]    Weight change:   Labs: Basic Metabolic Panel:  Recent Labs Lab 10/09/13 0525 10/10/13 0800 10/11/13 0528  NA 141 141 139  K 4.5 5.2 4.5  CL 98 98 96  CO2 28 25 25   GLUCOSE 109*  125* 98  BUN 30* 42* 19  CREATININE 5.34* 6.61* 3.99*  CALCIUM 9.2 9.3 8.9  PHOS  --  5.7*  --    Liver Function Tests:  Recent Labs Lab 10/09/13 0525 10/10/13 0800 10/11/13 0528  AST 21 16 22   ALT 15 12 14   ALKPHOS 54 50 58  BILITOT 0.3 0.3 0.3  PROT 7.0 7.0 7.6  ALBUMIN 2.3* 2.2* 2.3*   CBC:  Recent Labs Lab 10/07/13 0825 10/08/13 0605 10/09/13 0525 10/09/13 1700 10/10/13 0800 10/11/13 0528  WBC 9.2 7.0 7.6  --  7.3 6.0  NEUTROABS 5.8 4.1 4.7  --  4.3 3.4  HGB 10.6* 11.0* 10.0* 10.1* 9.5* 10.7*  HCT 32.1* 34.4* 30.5* 30.8* 29.1* 32.9*  MCV 83.2 86.0 84.5  --  83.6 84.6  PLT 342 330 348  --  367 371   CBG:  Recent Labs Lab 10/10/13 1504 10/10/13 2012 10/11/13 0004 10/11/13 0409 10/11/13 0744  GLUCAP 156* 138* 112* 105* 96   Studies/Results: No results found. Medications: . dextrose 5 % and 0.9% NaCl 20 mL (10/08/13 1818)   . bisacodyl  5 mg Oral BID  . chlorhexidine  60 mL Topical  Once  . collagenase   Topical Daily  . darbepoetin (ARANESP) injection - DIALYSIS  25 mcg Intravenous Q Tue-HD  . feeding supplement (NEPRO CARB STEADY)  237 mL Oral BID BM  . feeding supplement (PRO-STAT SUGAR FREE 64)  30 mL Oral BID  . heparin  5,000 Units Subcutaneous 3 times per day  . insulin aspart  0-20 Units Subcutaneous 6 times per day  . labetalol  300 mg Oral BID  . neomycin-bacitracin-polymyxin   Topical TID  . ondansetron (ZOFRAN) IV  4 mg Intravenous 3 times per day  . piperacillin-tazobactam (ZOSYN)  IV  2.25 g Intravenous 3 times per day  . sodium chloride  3 mL Intravenous Q12H  . vancomycin  1,000 mg Intravenous On Call to OR  . vancomycin  750 mg Intravenous Q T,Th,Sa-HD    Oscar Haber, PA-C [place] [phone] 10/11/2013,8:34 AM  LOS: 10 days

## 2013-10-12 LAB — GLUCOSE, CAPILLARY
GLUCOSE-CAPILLARY: 109 mg/dL — AB (ref 70–99)
GLUCOSE-CAPILLARY: 119 mg/dL — AB (ref 70–99)
GLUCOSE-CAPILLARY: 139 mg/dL — AB (ref 70–99)
Glucose-Capillary: 111 mg/dL — ABNORMAL HIGH (ref 70–99)
Glucose-Capillary: 112 mg/dL — ABNORMAL HIGH (ref 70–99)
Glucose-Capillary: 209 mg/dL — ABNORMAL HIGH (ref 70–99)

## 2013-10-12 LAB — CBC WITH DIFFERENTIAL/PLATELET
BASOS ABS: 0 10*3/uL (ref 0.0–0.1)
BASOS PCT: 0 % (ref 0–1)
EOS ABS: 0.1 10*3/uL (ref 0.0–0.7)
EOS PCT: 1 % (ref 0–5)
HCT: 28.2 % — ABNORMAL LOW (ref 39.0–52.0)
Hemoglobin: 9.2 g/dL — ABNORMAL LOW (ref 13.0–17.0)
Lymphocytes Relative: 15 % (ref 12–46)
Lymphs Abs: 1.1 10*3/uL (ref 0.7–4.0)
MCH: 27.4 pg (ref 26.0–34.0)
MCHC: 32.6 g/dL (ref 30.0–36.0)
MCV: 83.9 fL (ref 78.0–100.0)
Monocytes Absolute: 1.1 10*3/uL — ABNORMAL HIGH (ref 0.1–1.0)
Monocytes Relative: 14 % — ABNORMAL HIGH (ref 3–12)
NEUTROS PCT: 71 % (ref 43–77)
Neutro Abs: 5.4 10*3/uL (ref 1.7–7.7)
PLATELETS: 411 10*3/uL — AB (ref 150–400)
RBC: 3.36 MIL/uL — ABNORMAL LOW (ref 4.22–5.81)
RDW: 19.2 % — AB (ref 11.5–15.5)
WBC: 7.6 10*3/uL (ref 4.0–10.5)

## 2013-10-12 LAB — CULTURE, BLOOD (ROUTINE X 2)
Culture: NO GROWTH
Culture: NO GROWTH

## 2013-10-12 LAB — COMPREHENSIVE METABOLIC PANEL
ALBUMIN: 2.4 g/dL — AB (ref 3.5–5.2)
ALK PHOS: 62 U/L (ref 39–117)
ALT: 14 U/L (ref 0–53)
AST: 23 U/L (ref 0–37)
BILIRUBIN TOTAL: 0.5 mg/dL (ref 0.3–1.2)
BUN: 31 mg/dL — ABNORMAL HIGH (ref 6–23)
CHLORIDE: 94 meq/L — AB (ref 96–112)
CO2: 24 meq/L (ref 19–32)
CREATININE: 5.73 mg/dL — AB (ref 0.50–1.35)
Calcium: 9.4 mg/dL (ref 8.4–10.5)
GFR calc Af Amer: 11 mL/min — ABNORMAL LOW (ref >90)
GFR calc non Af Amer: 10 mL/min — ABNORMAL LOW (ref >90)
Glucose, Bld: 126 mg/dL — ABNORMAL HIGH (ref 70–99)
Potassium: 5.1 mEq/L (ref 3.7–5.3)
Sodium: 138 mEq/L (ref 137–147)
Total Protein: 7.5 g/dL (ref 6.0–8.3)

## 2013-10-12 LAB — VANCOMYCIN, RANDOM: VANCOMYCIN RM: 18.9 ug/mL

## 2013-10-12 LAB — MAGNESIUM: MAGNESIUM: 2 mg/dL (ref 1.5–2.5)

## 2013-10-12 NOTE — Progress Notes (Signed)
Patient ID: Oscar Castillo, male   DOB: 04-26-1951, 63 y.o.   MRN: [id number] Postoperative day 1 status post revision right transtibial amputation. There was good healthy tissue at the incision site. I feel the IV antibiotics can be discontinued today. Patient does respond appropriately but does not want to eat, clamps his mouth when trying to be fed.

## 2013-10-12 NOTE — Evaluation (Signed)
Physical Therapy Re-Evaluation Patient Details Name: Oscar Castillo MRN: [id number] DOB: January 04, 1951 Today's Date: 10/12/2013 Time: 4098-1191 PT Time Calculation (min): 27 min  PT Assessment / Plan / Recommendation History of Present Illness  63 y.o. male admitted to [facility] on 10/01/13 with AMS and FTT.  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 Of note, he has history of L BKA as well. Pt with significant PMHx of diabetes and severe peripheral vascular disease who also has end-stage renal disease and undergoes dialysis.  S/p revision right transtibial amputation 3/18  Clinical Impression  Patient s/p revision of right BKA yesterday and s/p HD today seems less interactive, less initiative for mobility and only responds "Okay" to questions.  Will need SNF level rehab at d/c.  Will follow acutely to address deficits noted below (PT problem list.)    PT Assessment  Patient needs continued PT services    Follow Up Recommendations  SNF;Supervision/Assistance - 24 hour          Equipment Recommendations  None recommended by PT    Recommendations for Other Services   None  Frequency Min 2X/week    Precautions / Restrictions Precautions Precautions: Fall Precaution Comments: pt also with poor vision, wife reporting he has to have cataract surgery Restrictions Weight Bearing Restrictions: Yes RLE Weight Bearing: Non weight bearing LLE Weight Bearing: Non weight bearing Other Position/Activity Restrictions: bil BKA   Pertinent Vitals/Pain With transitions moans with movement right LE      Mobility  Bed Mobility Overal bed mobility: Needs Assistance Bed Mobility: Rolling;Sidelying to Sit;Sit to Supine Rolling: Mod assist Sidelying to sit: Total assist;+2 for physical assistance Sit to supine: Max assist;+2 for physical assistance General bed mobility comments: patient not initiating to come upright with cues and facilitation through head and neck; once  upright,sat edge of bed about 10 minutes attempted to engage in meal, but patient keeps mouth closed after only taking one bite    Exercises     PT Diagnosis: Generalized weakness;Altered mental status;Acute pain  PT Problem List: Decreased strength;Decreased mobility;Decreased range of motion;Decreased activity tolerance;Decreased balance;Decreased cognition;Decreased safety awareness PT Treatment Interventions: DME instruction;Therapeutic exercise;Wheelchair mobility training;Balance training;Functional mobility training;Therapeutic activities;Patient/family education     PT Goals(Current goals can be found in the care plan section) Acute Rehab PT Goals Patient Stated Goal: pt unable to state PT Goal Formulation: With patient Time For Goal Achievement: 10/26/13 Potential to Achieve Goals: Poor  Visit Information  Last PT Received On: 10/12/13 Assistance Needed: +2 History of Present Illness: 63 y.o. male admitted to [facility] on 10/01/13 with AMS and FTT.  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 Of note, he has history of L BKA as well. Pt with significant PMHx of diabetes and severe peripheral vascular disease who also has end-stage renal disease and undergoes dialysis.  S/p revision right transtibial amputation 3/18       Prior [place] expects to be discharged to:: Skilled nursing facility Prior Function Comments: considering [facility] for SNF therapy    Cognition  Cognition Arousal/Alertness: Awake/alert Behavior During Therapy: Flat affect Overall Cognitive Status: Impaired/Different from baseline General Comments: not following commands this date, has fixed left gaze and answering "okay" for all questions    Extremity/Trunk Assessment Lower Extremity Assessment RLE Deficits / Details: right knee extension about -25 degrees AAROM  LLE Deficits / Details: able to extend knee to about neutral   Balance Balance  Overall  balance assessment: Needs assistance Sitting-balance support: Feet unsupported;Bilateral upper extremity supported Sitting balance-Leahy Scale: Poor Sitting balance - Comments: pushing back when attempting to engage in eating, able to balance close supervision few moments, but overall mod assist for balance sitting about 10 minutes  End of Session PT - End of Session Activity Tolerance: Patient limited by lethargy (poor overall interaction, lack of initation) Patient left: in bed;with call bell/phone within reach;with bed alarm set Nurse Communication: Other (comment) (decrease in interaction/active movement)  GP     Tyaire Odem,CYNDI 10/12/2013, 4:18 PM Magda Kiel, [place] 10/12/2013

## 2013-10-12 NOTE — Clinical Social Work Note (Signed)
CSW spoke briefly with wife at the bedside regarding discharge plans. Oscar Castillo reported that she has spoken with MD and patient will be ready for discharge soon and she has also contacted [place] regarding patient's upcoming discharge. CSW informed wife that contact will be made with facility when patient ready for discharge.  Warren Lindahl Givens, MSW, LCSW [phone]

## 2013-10-12 NOTE — Progress Notes (Signed)
[facility] PROGRESS NOTE  [address] [id number] DOB: [birth year] DOA: 10/01/2013 PCP: [name], MD  Assessment/Plan:  Encephalopathy, metabolic  -Suspect dehydration and possible underlying infectious processes as etiology - reassess daily  New LBBB  -has been transient in nature  -systolic function normal but septal motion with abnormal function and dyssynergy  -cardiac enzymes negative    Dehydration/FTT (failure to thrive) in adult  - Speech and Land (SLP) attempted 10/02/2013 the patient unable to arouse enough to eval but later re examined and now on D1 diet   Fever  -Unclear etiology- suspect DH and atelectasis  -Resolved -Blood cultures no growth to date urinalysis unremarkable  -3/14 Right BKA stump shows abscess/osteomyelitis by MRI -s/p  abscess drainage/revision of right BKA stump on 3/18  - Agree with discontinuing IV antibiotics  HYPERTENSION  -Continue labetalol 300 mg BID  -Continue hydralazine 5 mg PRN SBP> 160 or DBP> 100   Anemia due to chronic illness  -Hemoglobin has trended down to 13 after hydration noting baseline just under 11  -3/17 Monitor closely currently at 9.5, repeat H./H.  -3/17 type and cross. With patient having revision in a.m. of right BKA will most likely need post surgical transfusion (would use hemoconcentrated PRBC)   DM (diabetes mellitus) type I controlled with renal manifestation  -On Lantus prior to admission  -Continue resistant SSI; CBG currently controlled on this regimen   CKD (chronic kidney disease) stage V requiring chronic dialysis  -Usual dialysis days Tuesday Thursday Saturday  -No ultrafiltration secondary to ongoing dehydration   Chronic combined systolic (EF [id number]) and grade 2 diastolic congestive heart failure  -Compensated and managed with dialysis on T/Th/Sat   HEPATITIS C, HX OF  CEREBROVASCULAR ACCIDENT, HX OF  -No evidence of acute infarct   Hyperlipidemia  -stable  Right  BKA  -Pain is increasing since surgery in February 2015. MRI 3/13 shows pathology see results below  -3/14 MRI of right BKA stump shows abscess/osteomyelitis  s/p  abscess drainage/revision of right BKA stump on 3/18  Osteomyelitis right BKA stump  - Please see above.  - d/c IV antibiotics given recent abscess drainage/revision of R BKA  Code Status: Full  Family Communication: Wife and sister present for discussion of plan of care  Disposition Plan: We will await recommendations from palliative care    Consultants:  Nephrology   Palliative  Orthopaedic  Procedures:  As listed above  Antibiotics:  Vanc and zosyn  HPI/Subjective: Pt has no new complaints. No acute issues reported overnight.  Objective: Filed Vitals:   10/12/13 1130  BP: 120/71  Pulse: 104  Temp:   Resp:     Intake/Output Summary (Last 24 hours) at 10/12/13 1217 Last data filed at 10/11/13 1814  Gross per 24 hour  Intake    300 ml  Output      0 ml  Net    300 ml   Filed Weights   10/10/13 0804 10/10/13 2120 10/11/13 2200  Weight: 88 kg (194 lb 0.1 oz) 87 kg (191 lb 12.8 oz) 82.6 kg (182 lb 1.6 oz)    Exam:   General:  Pt in NAD, Alert and awake  Cardiovascular: RRR, no mrg  Respiratory: CTA BL, no wheezes, no increased wob  Abdomen: soft, ND, NT  Musculoskeletal: no cyanosis at upper extremities.   Data Reviewed: Basic Metabolic Panel:  Recent Labs Lab 10/08/13 0605 10/09/13 0525 10/10/13 0800 10/11/13 0528 10/12/13 0500  NA 140 141 141 139 138  K 4.6 4.5 5.2 4.5 5.1  CL 97 98 98 96 94*  CO2 27 28 25 25 24   GLUCOSE 124* 109* 125* 98 126*  BUN 21 30* 42* 19 31*  CREATININE 3.84* 5.34* 6.61* 3.99* 5.73*  CALCIUM 9.3 9.2 9.3 8.9 9.4  MG 2.1 2.0 2.2 1.9 2.0  PHOS  --   --  5.7*  --   --    Liver Function Tests:  Recent Labs Lab 10/08/13 0605 10/09/13 0525 10/10/13 0800 10/11/13 0528 10/12/13 0500  AST 33 21 16 22 23   ALT 18 15 12 14 14   ALKPHOS 58 54 50 58  62  BILITOT 0.4 0.3 0.3 0.3 0.5  PROT 7.7 7.0 7.0 7.6 7.5  ALBUMIN 2.6* 2.3* 2.2* 2.3* 2.4*   No results found for this basename: LIPASE, AMYLASE,  in the last 168 hours No results found for this basename: AMMONIA,  in the last 168 hours CBC:  Recent Labs Lab 10/08/13 0605 10/09/13 0525 10/09/13 1700 10/10/13 0800 10/11/13 0528 10/12/13 0500  WBC 7.0 7.6  --  7.3 6.0 7.6  NEUTROABS 4.1 4.7  --  4.3 3.4 5.4  HGB 11.0* 10.0* 10.1* 9.5* 10.7* 9.2*  HCT 34.4* 30.5* 30.8* 29.1* 32.9* 28.2*  MCV 86.0 84.5  --  83.6 84.6 83.9  PLT 330 348  --  367 371 411*   Cardiac Enzymes: No results found for this basename: CKTOTAL, CKMB, CKMBINDEX, TROPONINI,  in the last 168 hours BNP (last 3 results)  Recent Labs  10/01/13 1135  PROBNP 1397.0*   CBG:  Recent Labs Lab 10/11/13 1724 10/11/13 2008 10/12/13 0014 10/12/13 0441 10/12/13 0739  GLUCAP 144* 116* 111* 109* 119*    Recent Results (from the past 240 hour(s))  MRSA PCR SCREENING     Status: None   Collection Time    10/02/13 11:18 PM      Result Value Ref Range Status   MRSA by PCR NEGATIVE  NEGATIVE Final   Comment:            The GeneXpert MRSA Assay (FDA     approved for NASAL specimens     only), is one component of a     comprehensive MRSA colonization     surveillance program. It is not     intended to diagnose MRSA     infection nor to guide or     monitor treatment for     MRSA infections.  CLOSTRIDIUM DIFFICILE BY PCR     Status: None   Collection Time    10/05/13  2:05 AM      Result Value Ref Range Status   C difficile by pcr NEGATIVE  NEGATIVE Final  CULTURE, BLOOD (ROUTINE X 2)     Status: None   Collection Time    10/05/13 10:42 PM      Result Value Ref Range Status   Specimen Description BLOOD RIGHT UPPER ARM   Final   Special Requests BOTTLES DRAWN AEROBIC AND ANAEROBIC 10CC   Final   Culture  Setup Time     Final   Value: 10/06/2013 03:56     Performed at [facility]   Culture      Final   Value: NO GROWTH 5 DAYS     Performed at [facility]   Report Status 10/12/2013 FINAL   Final  CULTURE, BLOOD (ROUTINE X 2)     Status: None   Collection Time    10/05/13  10:56 PM      Result Value Ref Range Status   Specimen Description BLOOD RIGHT FOREARM   Final   Special Requests BOTTLES DRAWN AEROBIC AND ANAEROBIC 10CC   Final   Culture  Setup Time     Final   Value: 10/06/2013 03:56     Performed at [facility]   Culture     Final   Value: NO GROWTH 5 DAYS     Performed at [facility]   Report Status 10/12/2013 FINAL   Final     Studies: No results found.  Scheduled Meds: . bisacodyl  5 mg Oral BID  . darbepoetin (ARANESP) injection - DIALYSIS  25 mcg Intravenous Q Tue-HD  . feeding supplement (NEPRO CARB STEADY)  237 mL Oral BID BM  . feeding supplement (PRO-STAT SUGAR FREE 64)  30 mL Oral BID  . heparin  5,000 Units Subcutaneous 3 times per day  . insulin aspart  0-20 Units Subcutaneous 6 times per day  . labetalol  300 mg Oral BID  . multivitamin  1 tablet Oral QHS  . neomycin-bacitracin-polymyxin   Topical TID  . ondansetron (ZOFRAN) IV  4 mg Intravenous 3 times per day  . sodium chloride  3 mL Intravenous Q12H   Continuous Infusions: . dextrose 5 % and 0.9% NaCl 20 mL (10/08/13 1818)    Active Problems:   HYPERTENSION   HEPATITIS C, HX OF   CEREBROVASCULAR ACCIDENT, HX OF   BENIGN PROSTATIC HYPERTROPHY   Hyperlipidemia   Anemia due to chronic illness   DM (diabetes mellitus) type I controlled with renal manifestation   Chronic combined systolic (EF 92%) and grade 2diastolic congestive heart failure   FTT (failure to thrive) in adult   Encephalopathy, metabolic   Dehydration   Fever    Time spent: > 35 minutes    Velvet Bathe  [facility] Pager [phone]. If 7PM-7AM, please contact night-coverage at [email or web address], password [facility] 10/12/2013, 12:17 PM  LOS: 11 days

## 2013-10-12 NOTE — Procedures (Signed)
I was present at this dialysis session. I have reviewed the session itself and made appropriate changes.   Pt perseverates in response today.  Tolerating HD well.  No evidence of improved mental status.    Pearson Grippe  MD 10/12/2013, 9:31 AM

## 2013-10-13 ENCOUNTER — Encounter (HOSPITAL_COMMUNITY): Payer: Self-pay | Admitting: Orthopedic Surgery

## 2013-10-13 ENCOUNTER — Inpatient Hospital Stay (HOSPITAL_COMMUNITY): Payer: Medicare Other

## 2013-10-13 DIAGNOSIS — E41 Nutritional marasmus: Secondary | ICD-10-CM

## 2013-10-13 LAB — GLUCOSE, CAPILLARY
GLUCOSE-CAPILLARY: 116 mg/dL — AB (ref 70–99)
GLUCOSE-CAPILLARY: 120 mg/dL — AB (ref 70–99)
Glucose-Capillary: 126 mg/dL — ABNORMAL HIGH (ref 70–99)
Glucose-Capillary: 136 mg/dL — ABNORMAL HIGH (ref 70–99)
Glucose-Capillary: 111 mg/dL — ABNORMAL HIGH (ref 70–99)
Glucose-Capillary: 180 mg/dL — ABNORMAL HIGH (ref 70–99)
Glucose-Capillary: 136 mg/dL — ABNORMAL HIGH (ref 70–99)

## 2013-10-13 LAB — HIV ANTIBODY (ROUTINE TESTING W REFLEX): HIV: NONREACTIVE

## 2013-10-13 LAB — COMPREHENSIVE METABOLIC PANEL
ALBUMIN: 2.3 g/dL — AB (ref 3.5–5.2)
ALK PHOS: 59 U/L (ref 39–117)
ALT: 13 U/L (ref 0–53)
AST: 26 U/L (ref 0–37)
BILIRUBIN TOTAL: 0.5 mg/dL (ref 0.3–1.2)
BUN: 16 mg/dL (ref 6–23)
CHLORIDE: 98 meq/L (ref 96–112)
CO2: 25 mEq/L (ref 19–32)
Calcium: 9.6 mg/dL (ref 8.4–10.5)
Creatinine, Ser: 3.89 mg/dL — ABNORMAL HIGH (ref 0.50–1.35)
GFR calc Af Amer: 18 mL/min — ABNORMAL LOW (ref >90)
GFR calc non Af Amer: 15 mL/min — ABNORMAL LOW (ref >90)
Glucose, Bld: 128 mg/dL — ABNORMAL HIGH (ref 70–99)
POTASSIUM: 4.2 meq/L (ref 3.7–5.3)
SODIUM: 140 meq/L (ref 137–147)
Total Protein: 7.8 g/dL (ref 6.0–8.3)

## 2013-10-13 LAB — CBC WITH DIFFERENTIAL/PLATELET
Basophils Absolute: 0 10*3/uL (ref 0.0–0.1)
Basophils Relative: 0 % (ref 0–1)
Eosinophils Absolute: 0 10*3/uL (ref 0.0–0.7)
Eosinophils Relative: 0 % (ref 0–5)
HEMATOCRIT: 28.5 % — AB (ref 39.0–52.0)
Hemoglobin: 9.1 g/dL — ABNORMAL LOW (ref 13.0–17.0)
LYMPHS ABS: 1.9 10*3/uL (ref 0.7–4.0)
LYMPHS PCT: 19 % (ref 12–46)
MCH: 27.4 pg (ref 26.0–34.0)
MCHC: 31.9 g/dL (ref 30.0–36.0)
MCV: 85.8 fL (ref 78.0–100.0)
MONO ABS: 1.7 10*3/uL — AB (ref 0.1–1.0)
Monocytes Relative: 18 % — ABNORMAL HIGH (ref 3–12)
Neutro Abs: 6.1 10*3/uL (ref 1.7–7.7)
Neutrophils Relative %: 63 % (ref 43–77)
Platelets: 430 10*3/uL — ABNORMAL HIGH (ref 150–400)
RBC: 3.32 MIL/uL — ABNORMAL LOW (ref 4.22–5.81)
RDW: 20 % — AB (ref 11.5–15.5)
WBC: 9.8 10*3/uL (ref 4.0–10.5)

## 2013-10-13 LAB — RPR: RPR: NONREACTIVE

## 2013-10-13 LAB — VITAMIN B12: VITAMIN B 12: 634 pg/mL (ref 211–911)

## 2013-10-13 LAB — FOLATE: Folate: 4.6 ng/mL

## 2013-10-13 LAB — MAGNESIUM: Magnesium: 2.1 mg/dL (ref 1.5–2.5)

## 2013-10-13 LAB — TSH: TSH: 1.028 u[IU]/mL (ref 0.350–4.500)

## 2013-10-13 MED ORDER — ACETAMINOPHEN 650 MG RE SUPP
650.0000 mg | Freq: Four times a day (QID) | RECTAL | Status: DC | PRN
  Administered 2013-10-13 – 2013-10-14 (×2): 650 mg via RECTAL
  Filled 2013-10-13 (×2): qty 1

## 2013-10-13 NOTE — Progress Notes (Signed)
Occupational Therapy Treatment Patient Details Name: Oscar Castillo MRN: [id number] DOB: [birth year] Today's Date: 10/13/2013 Time: 9326-7124 OT Time Calculation (min): 14 min  OT Assessment / Plan / Recommendation  History of present illness 63 y.o. male admitted to [facility] on 10/01/13 with AMS and FTT.  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 Of note, he has history of L BKA as well. Pt with significant PMHx of diabetes and severe peripheral vascular disease who also has end-stage renal disease and undergoes dialysis.  S/p revision right transtibial amputation 3/18   OT comments  Pt now s/p revision of right transtibial amputation 3/18.  Pt continues to require total assist for ADL and bed mobility tasks. Recommend SNF for d/c planning.  Follow Up Recommendations  SNF;Supervision/Assistance - 24 hour    Barriers to Discharge       Equipment Recommendations  None recommended by OT    Recommendations for Other Services    Frequency Min 2X/week   Progress towards OT Goals Progress towards OT goals: Progressing toward goals  Plan Discharge plan remains appropriate    Precautions / Restrictions Precautions Precautions: Fall Precaution Comments: pt also with poor vision, wife reporting he has to have cataract surgery Restrictions Weight Bearing Restrictions: Yes RLE Weight Bearing: Non weight bearing LLE Weight Bearing: Non weight bearing Other Position/Activity Restrictions: bil BKA   Pertinent Vitals/Pain See vitals    ADL  Grooming: Performed;Wash/dry hands;Wash/dry face;+1 Total assistance Where Assessed - Grooming: Supine, head of bed up ADL Comments: Pt with flat affect throughout session.  Opened eyes when called by name upon OT arrival but then kept eyes closed during grooming tasks.  Pt with minimal initiation to bring washcloth to face but required +1 total HOH to bring washcloth completely to face and to complete grooming task. Total assist for all  ADLs.    OT Diagnosis:    OT Problem List:   OT Treatment Interventions:     OT Goals(current goals can now be found in the care plan section) Acute Rehab OT Goals Patient Stated Goal: pt unable to state OT Goal Formulation: Patient unable to participate in goal setting Time For Goal Achievement: 10/23/13 Potential to Achieve Goals: Fair ADL Goals Pt Will Perform Eating: with mod assist;with adaptive utensils;bed level Pt Will Perform Grooming: with mod assist;bed level Additional ADL Goal #1: pt will roll R and L to assist with ADL care with mod assist.  Visit Information  Last OT Received On: 10/13/13 Assistance Needed: +2 History of Present Illness: 63 y.o. male admitted to [facility] on 10/01/13 with AMS and FTT.  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 Of note, he has history of L BKA as well. Pt with significant PMHx of diabetes and severe peripheral vascular disease who also has end-stage renal disease and undergoes dialysis.  S/p revision right transtibial amputation 3/18    Subjective Data      Prior Functioning       Cognition  Cognition Arousal/Alertness: Lethargic Behavior During Therapy: Flat affect Overall Cognitive Status: Impaired/Different from baseline Area of Impairment: Following commands Following Commands: Follows one step commands inconsistently;Follows one step commands with increased time    Mobility  Bed Mobility Overal bed mobility: Needs Assistance Bed Mobility: Rolling Rolling: Total assist General bed mobility comments: Total assist with use of draw pad to reposition pt in bed.  Pt not initiating during mobility tasks.    Exercises  Balance    End of Session OT - End of Session Activity Tolerance: Patient limited by lethargy;Patient limited by fatigue Patient left: in bed;with call bell/phone within reach  GO    10/13/2013 Darrol Jump OTR/L Pager [phone] Office [phone]  Darrol Jump 10/13/2013, 1:09 PM

## 2013-10-13 NOTE — Progress Notes (Signed)
Subjective:  Not much voiced response at all today ,opens eyes to voice/ tolerated hd yesterday Objective Vital signs in last 24 hours: Filed Vitals:   10/12/13 1401 10/12/13 1657 10/12/13 2018 10/13/13 0424  BP: 130/73 132/75 121/69 147/75  Pulse: 107 105 102 103  Temp: 98.3 F (36.8 C) 98.5 F (36.9 C) 100.1 F (37.8 C) 100.2 F (37.9 C)  TempSrc:  Axillary Oral Oral  Resp: 16 18 18 18   Height:      Weight:   83 kg (182 lb 15.7 oz)   SpO2: 100% 100% 100% 99%  Physical Exam  General: Awoken from sleep and rare mumbled responses today Heart: RRR no rub or mur.  Lungs: grossly clear without rales or wheezes  Abdomen: soft NT, ND  Extremities: right BKA post op bandage dry /clean/ left BKA with scabbed areas, no drainage  Dialysis Access: left upper AVF + bruit   Dialysis: [facility]  4h 83min 72.5kg 2/2.0 Bath LUA AVF Heparin 5000 / 2400  Hectorol 1 Epo none Venofer none  \ Assessment/Plan:  1. AMS -;less interactive prob sec to fever now post op  2. SP RBKA with R AKA abscess and osteom.= on vanco /zosyn 3. ESRD - TTS - HD per routine  k 4.2 4. Anemia - Hgb 10,7;> 9.1 dose Aranesp 25 q Tuesday hd  5. Secondary hyperparathyroidism - corrected Ca 10.8 - 5.7 phos no binders Use crushed Fosrenol /use 2 Ca bath; hectorol being held -  6 HTN/volume - volume ^^^ by bed weights;99 % o2 sat RA/ lungs grossly CTA/doubt accuracy;wts/ UF 2 l tolerated yesterday / hd in am 2 to 3 l iter [place] as tolerates / on labetalol 300 mg bid 7. Nutrition - poor intake variable; discussed with nursing; poor intake recorded; needs to be fed; has nepro between meals   8. PVD with bilateral BKAs  9. Hx CVA  10. Type 2 DM -D5 NS at 20/hr - poor intake  11. Hep C + LFT ok  12. FTT/debiliatated/from SNF - palliative care has seen - still full Code; needs snh /Palliatie care dw wife GOC   Weight change: 2.4 kg (5 lb 4.7 oz)  Labs: Basic Metabolic Panel:  Recent Labs Lab 10/10/13 0800 10/11/13 0528  10/12/13 0500 10/13/13 0500  NA 141 139 138 140  K 5.2 4.5 5.1 4.2  CL 98 96 94* 98  CO2 25 25 24 25   GLUCOSE 125* 98 126* 128*  BUN 42* 19 31* 16  CREATININE 6.61* 3.99* 5.73* 3.89*  CALCIUM 9.3 8.9 9.4 9.6  PHOS 5.7*  --   --   --    Liver Function Tests:  Recent Labs Lab 10/11/13 0528 10/12/13 0500 10/13/13 0500  AST 22 23 26   ALT 14 14 13   ALKPHOS 58 62 59  BILITOT 0.3 0.5 0.5  PROT 7.6 7.5 7.8  ALBUMIN 2.3* 2.4* 2.3*  CBC:  Recent Labs Lab 10/09/13 0525  10/10/13 0800 10/11/13 0528 10/12/13 0500 10/13/13 0500  WBC 7.6  --  7.3 6.0 7.6 9.8  NEUTROABS 4.7  --  4.3 3.4 5.4 6.1  HGB 10.0*  < > 9.5* 10.7* 9.2* 9.1*  HCT 30.5*  < > 29.1* 32.9* 28.2* 28.5*  MCV 84.5  --  83.6 84.6 83.9 85.8  PLT 348  --  367 371 411* 430*  < > = values in this interval not displayed. CBG:  Recent Labs Lab 10/12/13 1655 10/12/13 2017 10/13/13 0002 10/13/13 0420 10/13/13 0737  GLUCAP 209* 112* 116* 126* 136*    Studies/Results: No results found. Medications: . dextrose 5 % and 0.9% NaCl 20 mL (10/08/13 1818)   . bisacodyl  5 mg Oral BID  . darbepoetin (ARANESP) injection - DIALYSIS  25 mcg Intravenous Q Tue-HD  . feeding supplement (NEPRO CARB STEADY)  237 mL Oral BID BM  . feeding supplement (PRO-STAT SUGAR FREE 64)  30 mL Oral BID  . heparin  5,000 Units Subcutaneous 3 times per day  . insulin aspart  0-20 Units Subcutaneous 6 times per day  . labetalol  300 mg Oral BID  . multivitamin  1 tablet Oral QHS  . neomycin-bacitracin-polymyxin   Topical TID  . ondansetron (ZOFRAN) IV  4 mg Intravenous 3 times per day  . sodium chloride  3 mL Intravenous Q12H    Ernest Haber, PA-C [place] [phone] 10/13/2013,8:34 AM  LOS: 12 days

## 2013-10-13 NOTE — Progress Notes (Addendum)
Patient HL:KTGYB D Dowen      DOB: [birth year]      WLS:[id number]   Palliative Medicine Team at [facility] Progress Note    Subjective: Met with patient and Mrs. Ronnald Ramp.  Patient continues to refuse to eat .  He perseverates most answers although today he stated "my mind hurts".  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  His CT does so atrophy which could go along with occult dementia.  Spouse states that prior to and after his first amputation he was able to help her travel in the car to dialysis and able to sit and watch TV. 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  His wife remains hopeful for him to return to baseline function. She did state that after his first amputation he stated he did not " feel like a man anymore".   He does not report any pain in his limb at this time.  He had fever over night and continues to refuse to eat.     Filed Vitals:   10/13/13 0835  BP: 132/69  Pulse: 100  Temp: 98.9 F (37.2 C)  Resp: 18   Physical exam:  General:  Awakens easily to loud verbal stimuli, states 'my brain hurts' PERRL, EOMI, anciteric, mm dry Chest decreased but clear CVS: regular S1, s2 Abd soft, not tender or distended Ext: right AKA wrapped Neuro: perseverates answers, not oriented to time or place   Chest xray no pneumonia Negative urine  Na 152mK 4.2, Bun 16, crt 3.89  Wbc Count 9.8 hgb 9.1   Assessment and plan: 63yr old african [place] with history of ESRD s/p AKA now required revision POD 2.  Patient has remained persistently encepahlopathic since his last surgery per his spouse he is now refusing to eat.  Family denies dementia but his behavior points to dementia with exacerbation.  Spouse wants all forms of treatment and does not have insight into current illness.  We talked about his not eating and the use of feeding tubes , pros and cons.  We talked about his code status.  I have provided her with a  Hard Choice Booklet and  MOST form to review with her Bishop who is playing a strong roll in decision making stating "Marines don't give up". I have offered to remeet on Sunday at 5 pm   1.  Full code  2.  Fever: family desire workup to determine source  3.  Pain seems reasonably controlled, currently on morphine as needed. 2 mg used in last 24 hours.  4.  Malnutrition: continue supplementation. Discussed feeding tubes with wife, patient will  Not benefit over time with this intervention. 5.  Depression vs dementia with encepahlopathy: consider psych eval. Total time 40 min 120 pm to 200 pm   Lilyona Richner L. TLovena Le MD MBA [facility]Phone: [phone]: [phone]

## 2013-10-13 NOTE — Progress Notes (Signed)
Patient ID: WOODLEY PETZOLD, male   DOB: May 30, 1951, 63 y.o.   MRN: [id number] Patient with elevated temperature this morning. The tissue at the revision amputation site on the right leg was healthy and viable and there is a low likelihood that this elevated temperature is coming from the right leg. Patient is still clamping his mouth shut and refusing to eat. May need hospice intervention.

## 2013-10-13 NOTE — Progress Notes (Signed)
[facility] PROGRESS NOTE  Oscar Castillo [id number] DOB: 22-Jan-1951 DOA: 10/01/2013 PCP: [name], MD  Assessment/Plan:  Encephalopathy, metabolic  -At this juncture etiology uncertain.  Thought to be secondary to infection but patient has had abscess drainage/revision of right BKA stump.  Still no improvement. Will obtain RPR, vitamin b 12, HIV, and folate levels If no improvement may warrant neurological assessment by our neurologist.  This may play a role as to disposition (hospice care etc)  - chest x ray obtained and reported as no active disease  New LBBB  -has been transient in nature  -systolic function normal but septal motion with abnormal function and dyssynergy  -cardiac enzymes negative    Dehydration/FTT (failure to thrive) in adult  - Speech and Land (SLP) attempted 10/02/2013 the patient unable to arouse enough to eval but later re examined and now on D1 diet   HYPERTENSION  -Continue labetalol 300 mg BID  -Continue hydralazine 5 mg PRN SBP> 160 or DBP> 100   Anemia due to chronic illness  -Hemoglobin has trended down to 13 after hydration noting baseline just under 11  -3/17 Monitor closely currently at 9.5, repeat H./H.  -3/17 type and cross. With patient having revision in a.m. of right BKA will most likely need post surgical transfusion (would use hemoconcentrated PRBC)   DM (diabetes mellitus) type I controlled with renal manifestation  -On Lantus prior to admission  -Continue resistant SSI; CBG currently controlled on this regimen   CKD (chronic kidney disease) stage V requiring chronic dialysis  -Usual dialysis days Tuesday Thursday Saturday  -No ultrafiltration secondary to ongoing dehydration   Chronic combined systolic (EF 93%) and grade 2 diastolic congestive heart failure  -Compensated and managed with dialysis on T/Th/Sat   HEPATITIS C, HX OF  CEREBROVASCULAR ACCIDENT, HX OF  -No evidence of acute infarct    Hyperlipidemia  -stable  Right BKA  -Pain is increasing since surgery in February 2015. MRI 3/13 shows pathology see results below  -3/14 MRI of right BKA stump shows abscess/osteomyelitis  s/p  abscess drainage/revision of right BKA stump on 3/18  Osteomyelitis right BKA stump  - Please see above.  - d/c IV antibiotics given recent abscess drainage/revision of R BKA  Code Status: Full  Family Communication: Wife and sister present for discussion of plan of care  Disposition Plan: We will await recommendations from palliative care    Consultants:  Nephrology   Palliative  Orthopaedic  Procedures:  As listed above  Antibiotics:  Vanc and zosyn  HPI/Subjective: Pt has no new complaints. No acute issues reported overnight.  Objective: Filed Vitals:   10/13/13 0835  BP: 132/69  Pulse: 100  Temp: 98.9 F (37.2 C)  Resp: 18    Intake/Output Summary (Last 24 hours) at 10/13/13 1504 Last data filed at 10/13/13 1100  Gross per 24 hour  Intake      0 ml  Output      0 ml  Net      0 ml   Filed Weights   10/12/13 0827 10/12/13 1303 10/12/13 2018  Weight: 85 kg (187 lb 6.3 oz) 83 kg (182 lb 15.7 oz) 83 kg (182 lb 15.7 oz)    Exam:   General:  Pt in NAD, Alert and awake  Cardiovascular: RRR, no mrg  Respiratory: CTA BL, no wheezes, no increased wob  Abdomen: soft, ND, NT  Musculoskeletal: no cyanosis at upper extremities.   Data Reviewed: Basic Metabolic Panel:  Recent Labs Lab 10/09/13 0525 10/10/13 0800 10/11/13 0528 10/12/13 0500 10/13/13 0500  NA 141 141 139 138 140  K 4.5 5.2 4.5 5.1 4.2  CL 98 98 96 94* 98  CO2 28 25 25 24 25   GLUCOSE 109* 125* 98 126* 128*  BUN 30* 42* 19 31* 16  CREATININE 5.34* 6.61* 3.99* 5.73* 3.89*  CALCIUM 9.2 9.3 8.9 9.4 9.6  MG 2.0 2.2 1.9 2.0 2.1  PHOS  --  5.7*  --   --   --    Liver Function Tests:  Recent Labs Lab 10/09/13 0525 10/10/13 0800 10/11/13 0528 10/12/13 0500 10/13/13 0500  AST 21  16 22 23 26   ALT 15 12 14 14 13   ALKPHOS 54 50 58 62 59  BILITOT 0.3 0.3 0.3 0.5 0.5  PROT 7.0 7.0 7.6 7.5 7.8  ALBUMIN 2.3* 2.2* 2.3* 2.4* 2.3*   No results found for this basename: LIPASE, AMYLASE,  in the last 168 hours No results found for this basename: AMMONIA,  in the last 168 hours CBC:  Recent Labs Lab 10/09/13 0525 10/09/13 1700 10/10/13 0800 10/11/13 0528 10/12/13 0500 10/13/13 0500  WBC 7.6  --  7.3 6.0 7.6 9.8  NEUTROABS 4.7  --  4.3 3.4 5.4 6.1  HGB 10.0* 10.1* 9.5* 10.7* 9.2* 9.1*  HCT 30.5* 30.8* 29.1* 32.9* 28.2* 28.5*  MCV 84.5  --  83.6 84.6 83.9 85.8  PLT 348  --  367 371 411* 430*   Cardiac Enzymes: No results found for this basename: CKTOTAL, CKMB, CKMBINDEX, TROPONINI,  in the last 168 hours BNP (last 3 results)  Recent Labs  10/01/13 1135  PROBNP 1397.0*   CBG:  Recent Labs Lab 10/12/13 2017 10/13/13 0002 10/13/13 0420 10/13/13 0815 10/13/13 1216  GLUCAP 112* 116* 126* 136* 111*    Recent Results (from the past 240 hour(s))  CLOSTRIDIUM DIFFICILE BY PCR     Status: None   Collection Time    10/05/13  2:05 AM      Result Value Ref Range Status   C difficile by pcr NEGATIVE  NEGATIVE Final  CULTURE, BLOOD (ROUTINE X 2)     Status: None   Collection Time    10/05/13 10:42 PM      Result Value Ref Range Status   Specimen Description BLOOD RIGHT UPPER ARM   Final   Special Requests BOTTLES DRAWN AEROBIC AND ANAEROBIC 10CC   Final   Culture  Setup Time     Final   Value: 10/06/2013 03:56     Performed at [facility]   Culture     Final   Value: NO GROWTH 5 DAYS     Performed at [facility]   Report Status 10/12/2013 FINAL   Final  CULTURE, BLOOD (ROUTINE X 2)     Status: None   Collection Time    10/05/13 10:56 PM      Result Value Ref Range Status   Specimen Description BLOOD RIGHT FOREARM   Final   Special Requests BOTTLES DRAWN AEROBIC AND ANAEROBIC 10CC   Final   Culture  Setup Time     Final   Value:  10/06/2013 03:56     Performed at [facility]   Culture     Final   Value: NO GROWTH 5 DAYS     Performed at [facility]   Report Status 10/12/2013 FINAL   Final     Studies: Dg Chest [place]  1 View  10/13/2013   CLINICAL DATA:  Atelectasis, pneumonia  EXAM: PORTABLE CHEST - 1 VIEW  COMPARISON:  10/02/2013  FINDINGS: Cardiomediastinal silhouette is stable. No acute infiltrate or pleural effusion. No pulmonary edema. Bony thorax is unremarkable.  IMPRESSION: No active disease.   Electronically Signed   By: Lahoma Crocker M.D.   On: 10/13/2013 10:20    Scheduled Meds: . bisacodyl  5 mg Oral BID  . darbepoetin (ARANESP) injection - DIALYSIS  25 mcg Intravenous Q Tue-HD  . feeding supplement (NEPRO CARB STEADY)  237 mL Oral BID BM  . feeding supplement (PRO-STAT SUGAR FREE 64)  30 mL Oral BID  . heparin  5,000 Units Subcutaneous 3 times per day  . insulin aspart  0-20 Units Subcutaneous 6 times per day  . labetalol  300 mg Oral BID  . multivitamin  1 tablet Oral QHS  . neomycin-bacitracin-polymyxin   Topical TID  . ondansetron (ZOFRAN) IV  4 mg Intravenous 3 times per day  . sodium chloride  3 mL Intravenous Q12H   Continuous Infusions: . dextrose 5 % and 0.9% NaCl 20 mL (10/08/13 1818)    Active Problems:   HYPERTENSION   HEPATITIS C, HX OF   CEREBROVASCULAR ACCIDENT, HX OF   BENIGN PROSTATIC HYPERTROPHY   Hyperlipidemia   Anemia due to chronic illness   DM (diabetes mellitus) type I controlled with renal manifestation   Chronic combined systolic (EF 74%) and grade 2diastolic congestive heart failure   FTT (failure to thrive) in adult   Encephalopathy, metabolic   Dehydration   Fever    Time spent: > 35 minutes    Velvet Bathe  [facility] Pager [phone]. If 7PM-7AM, please contact night-coverage at [email or web address], password [facility] 10/13/2013, 3:04 PM  LOS: 12 days

## 2013-10-13 NOTE — Progress Notes (Addendum)
Patient NT:Oscar Castillo      DOB: 03/02/1951      XVQ:[id number]  Checked on patient after dialysis last evening.  No family at bedside.  Patient appeared comfortable.  Spoke with spouse she will meet me on her lunch hour between 1-2 today.   Geraldin Habermehl L. Lovena Le, MD MBA The Palliative Medicine Team at [facility] Phone: [phone] Pager: [phone]

## 2013-10-13 NOTE — Progress Notes (Signed)
I saw the patient and agree with the above assessment and plan.    Pt answers today only with "Oscar Castillo".  No real improvement after surgical revision.  Not palliative care meeting and agree with addressing Goals of Care.

## 2013-10-14 ENCOUNTER — Encounter (HOSPITAL_COMMUNITY): Payer: Self-pay | Admitting: Psychiatry

## 2013-10-14 LAB — RENAL FUNCTION PANEL
Albumin: 2.3 g/dL — ABNORMAL LOW (ref 3.5–5.2)
BUN: 33 mg/dL — AB (ref 6–23)
CO2: 26 meq/L (ref 19–32)
Calcium: 9.3 mg/dL (ref 8.4–10.5)
Chloride: 100 mEq/L (ref 96–112)
Creatinine, Ser: 7.01 mg/dL — ABNORMAL HIGH (ref 0.50–1.35)
GFR calc Af Amer: 9 mL/min — ABNORMAL LOW (ref >90)
GFR calc non Af Amer: 7 mL/min — ABNORMAL LOW (ref >90)
Glucose, Bld: 114 mg/dL — ABNORMAL HIGH (ref 70–99)
PHOSPHORUS: 6.5 mg/dL — AB (ref 2.3–4.6)
POTASSIUM: 4.8 meq/L (ref 3.7–5.3)
SODIUM: 143 meq/L (ref 137–147)

## 2013-10-14 LAB — CBC
HEMATOCRIT: 26.4 % — AB (ref 39.0–52.0)
HEMOGLOBIN: 8.6 g/dL — AB (ref 13.0–17.0)
MCH: 27.7 pg (ref 26.0–34.0)
MCHC: 32.6 g/dL (ref 30.0–36.0)
MCV: 85.2 fL (ref 78.0–100.0)
Platelets: 461 10*3/uL — ABNORMAL HIGH (ref 150–400)
RBC: 3.1 MIL/uL — AB (ref 4.22–5.81)
RDW: 20.4 % — ABNORMAL HIGH (ref 11.5–15.5)
WBC: 8.8 10*3/uL (ref 4.0–10.5)

## 2013-10-14 LAB — GLUCOSE, CAPILLARY
GLUCOSE-CAPILLARY: 108 mg/dL — AB (ref 70–99)
GLUCOSE-CAPILLARY: 110 mg/dL — AB (ref 70–99)
GLUCOSE-CAPILLARY: 114 mg/dL — AB (ref 70–99)
Glucose-Capillary: 128 mg/dL — ABNORMAL HIGH (ref 70–99)

## 2013-10-14 LAB — MAGNESIUM: MAGNESIUM: 2.4 mg/dL (ref 1.5–2.5)

## 2013-10-14 MED ORDER — KCL IN DEXTROSE-NACL 20-5-0.45 MEQ/L-%-% IV SOLN: INTRAVENOUS | Status: DC

## 2013-10-14 MED ORDER — PIPERACILLIN-TAZOBACTAM IN DEX 2-0.25 GM/50ML IV SOLN
2.2500 g | Freq: Three times a day (TID) | INTRAVENOUS | Status: DC
  Administered 2013-10-15 – 2013-10-21 (×19): 2.25 g via INTRAVENOUS
  Filled 2013-10-14 (×25): qty 50

## 2013-10-14 MED ORDER — VANCOMYCIN HCL IN DEXTROSE 1-5 GM/200ML-% IV SOLN
1000.0000 mg | INTRAVENOUS | Status: DC
  Administered 2013-10-14 – 2013-10-19 (×3): 1000 mg via INTRAVENOUS
  Filled 2013-10-14 (×8): qty 200

## 2013-10-14 NOTE — Progress Notes (Signed)
[facility] PROGRESS NOTE  Oscar Castillo [id number]:[id number] DOB: February 23, 1951 DOA: 10/01/2013 PCP: [name], MD  Assessment/Plan:  Encephalopathy, metabolic  -At this juncture etiology uncertain.  Thought to be secondary to infection but patient has had abscess drainage/revision of right BKA stump.  Still no improvement. RPR and HIV Non reactive, B12 and folate levels within normal limits - consulted Psychiatry for evaluation of delirium vs dementia - chest x ray obtained and reported as no active disease - Given fevers will cover with broad spectrum antibiotics. Also patient has not been eating well as such will place ng tube and consult dietitian for tube feeds.  New LBBB  -has been transient in nature  -systolic function normal but septal motion with abnormal function and dyssynergy  -cardiac enzymes negative    Dehydration/FTT (failure to thrive) in adult  - Speech and Land (SLP) attempted 10/02/2013 the patient unable to arouse enough to eval but later re examined and now on D1 diet  -Will start on tube feeds  HYPERTENSION  -Continue labetalol 300 mg BID  -Continue hydralazine 5 mg PRN SBP> 160 or DBP> 100   Anemia due to chronic illness  -Will continue to monitor.  DM (diabetes mellitus) type I controlled with renal manifestation  -On Lantus prior to admission  -Continue resistant SSI; CBG currently controlled on this regimen   CKD (chronic kidney disease) stage V requiring chronic dialysis  -Usual dialysis days Tuesday Thursday Saturday  -No ultrafiltration secondary to ongoing dehydration   Chronic combined systolic (EF 95%) and grade 2 diastolic congestive heart failure  -Compensated and managed with dialysis on T/Th/Sat   HEPATITIS C, HX OF  CEREBROVASCULAR ACCIDENT, HX OF  -No evidence of acute infarct   Hyperlipidemia  -stable  Right BKA  -Pain is increasing since surgery in February 2015. MRI 3/13 shows pathology see results below  -3/14  MRI of right BKA stump shows abscess/osteomyelitis  s/p  abscess drainage/revision of right BKA stump on 3/18  Osteomyelitis right BKA stump  - Please see above.  - d/c IV antibiotics given recent abscess drainage/revision of R BKA  Code Status: Full  Family Communication: Discussed case with wife Disposition Plan: Awaiting improvement in medical condition, Wife would not like feeding tube placement but is ok with NG tube at this moment.  Consultants:  Nephrology   Palliative  Orthopaedic  Procedures:  As listed above  Antibiotics:  Vanc and zosyn  HPI/Subjective: Patient has had difficulty with oral intake. Wife states she has had difficulty feeding patient. Nursing reports poor oral intake since after the surgery. The patient's mental status seems to be less interactive today.  Objective: Filed Vitals:   10/14/13 1130  BP: 167/82  Pulse: 95  Temp: 99.7 F (37.6 C)  Resp: 18    Intake/Output Summary (Last 24 hours) at 10/14/13 1637 Last data filed at 10/13/13 1851  Gross per 24 hour  Intake    250 ml  Output      0 ml  Net    250 ml   Filed Weights   10/12/13 1303 10/12/13 2018 10/13/13 2058  Weight: 83 kg (182 lb 15.7 oz) 83 kg (182 lb 15.7 oz) 84.5 kg (186 lb 4.6 oz)    Exam:   General:  Pt in NAD, Alert and awake  Cardiovascular: RRR, no mrg  Respiratory: CTA BL, no wheezes, no increased wob  Abdomen: soft, ND, NT  Musculoskeletal: no cyanosis at upper extremities.   Data Reviewed: Basic Metabolic  Panel:  Recent Labs Lab 10/09/13 0525 10/10/13 0800 10/11/13 0528 10/12/13 0500 10/13/13 0500  NA 141 141 139 138 140  K 4.5 5.2 4.5 5.1 4.2  CL 98 98 96 94* 98  CO2 28 25 25 24 25   GLUCOSE 109* 125* 98 126* 128*  BUN 30* 42* 19 31* 16  CREATININE 5.34* 6.61* 3.99* 5.73* 3.89*  CALCIUM 9.2 9.3 8.9 9.4 9.6  MG 2.0 2.2 1.9 2.0 2.1  PHOS  --  5.7*  --   --   --    Liver Function Tests:  Recent Labs Lab 10/09/13 0525 10/10/13 0800  10/11/13 0528 10/12/13 0500 10/13/13 0500  AST 21 16 22 23 26   ALT 15 12 14 14 13   ALKPHOS 54 50 58 62 59  BILITOT 0.3 0.3 0.3 0.5 0.5  PROT 7.0 7.0 7.6 7.5 7.8  ALBUMIN 2.3* 2.2* 2.3* 2.4* 2.3*   No results found for this basename: LIPASE, AMYLASE,  in the last 168 hours No results found for this basename: AMMONIA,  in the last 168 hours CBC:  Recent Labs Lab 10/09/13 0525 10/09/13 1700 10/10/13 0800 10/11/13 0528 10/12/13 0500 10/13/13 0500  WBC 7.6  --  7.3 6.0 7.6 9.8  NEUTROABS 4.7  --  4.3 3.4 5.4 6.1  HGB 10.0* 10.1* 9.5* 10.7* 9.2* 9.1*  HCT 30.5* 30.8* 29.1* 32.9* 28.2* 28.5*  MCV 84.5  --  83.6 84.6 83.9 85.8  PLT 348  --  367 371 411* 430*   Cardiac Enzymes: No results found for this basename: CKTOTAL, CKMB, CKMBINDEX, TROPONINI,  in the last 168 hours BNP (last 3 results)  Recent Labs  10/01/13 1135  PROBNP 1397.0*   CBG:  Recent Labs Lab 10/13/13 2054 10/14/13 0002 10/14/13 0416 10/14/13 0808 10/14/13 1144  GLUCAP 136* 108* 110* 114* 128*    Recent Results (from the past 240 hour(s))  CLOSTRIDIUM DIFFICILE BY PCR     Status: None   Collection Time    10/05/13  2:05 AM      Result Value Ref Range Status   C difficile by pcr NEGATIVE  NEGATIVE Final  CULTURE, BLOOD (ROUTINE X 2)     Status: None   Collection Time    10/05/13 10:42 PM      Result Value Ref Range Status   Specimen Description BLOOD RIGHT UPPER ARM   Final   Special Requests BOTTLES DRAWN AEROBIC AND ANAEROBIC 10CC   Final   Culture  Setup Time     Final   Value: 10/06/2013 03:56     Performed at [facility]   Culture     Final   Value: NO GROWTH 5 DAYS     Performed at [facility]   Report Status 10/12/2013 FINAL   Final  CULTURE, BLOOD (ROUTINE X 2)     Status: None   Collection Time    10/05/13 10:56 PM      Result Value Ref Range Status   Specimen Description BLOOD RIGHT FOREARM   Final   Special Requests BOTTLES DRAWN AEROBIC AND ANAEROBIC 10CC    Final   Culture  Setup Time     Final   Value: 10/06/2013 03:56     Performed at [facility]   Culture     Final   Value: NO GROWTH 5 DAYS     Performed at [facility]   Report Status 10/12/2013 FINAL   Final     Studies: Dg  Chest Port 1 View  10/13/2013   CLINICAL DATA:  Atelectasis, pneumonia  EXAM: PORTABLE CHEST - 1 VIEW  COMPARISON:  10/02/2013  FINDINGS: Cardiomediastinal silhouette is stable. No acute infiltrate or pleural effusion. No pulmonary edema. Bony thorax is unremarkable.  IMPRESSION: No active disease.   Electronically Signed   By: Lahoma Crocker M.D.   On: 10/13/2013 10:20    Scheduled Meds: . bisacodyl  5 mg Oral BID  . darbepoetin (ARANESP) injection - DIALYSIS  25 mcg Intravenous Q Tue-HD  . feeding supplement (NEPRO CARB STEADY)  237 mL Oral BID BM  . feeding supplement (PRO-STAT SUGAR FREE 64)  30 mL Oral BID  . heparin  5,000 Units Subcutaneous 3 times per day  . insulin aspart  0-20 Units Subcutaneous 6 times per day  . labetalol  300 mg Oral BID  . multivitamin  1 tablet Oral QHS  . neomycin-bacitracin-polymyxin   Topical TID  . ondansetron (ZOFRAN) IV  4 mg Intravenous 3 times per day  . piperacillin-tazobactam (ZOSYN)  IV  2.25 g Intravenous 3 times per day  . sodium chloride  3 mL Intravenous Q12H  . vancomycin  1,000 mg Intravenous Q T,Th,Sa-HD   Continuous Infusions: . dextrose 5 % and 0.9% NaCl 20 mL (10/08/13 1818)    Active Problems:   HYPERTENSION   HEPATITIS C, HX OF   CEREBROVASCULAR ACCIDENT, HX OF   BENIGN PROSTATIC HYPERTROPHY   Hyperlipidemia   Anemia due to chronic illness   DM (diabetes mellitus) type I controlled with renal manifestation   Chronic combined systolic (EF 42%) and grade 2diastolic congestive heart failure   FTT (failure to thrive) in adult   Encephalopathy, metabolic   Dehydration   Fever    Time spent: > 35 minutes    Velvet Bathe  [facility] Pager [phone]. If 7PM-7AM, please  contact night-coverage at [email or web address], password [facility] 10/14/2013, 4:37 PM  LOS: 13 days

## 2013-10-14 NOTE — Progress Notes (Signed)
ANTIBIOTIC CONSULT NOTE - INITIAL  Pharmacy Consult for vanc/zosyn Indication: FUO  No Known Allergies  Patient Measurements: Height:  (bilateral amputee) Weight: 186 lb 4.6 oz (84.5 kg) IBW/kg (Calculated) : 40.8  Vital Signs: Temp: 99.7 F (37.6 C) (03/21 1130) Temp src: Oral (03/21 1130) BP: 167/82 mmHg (03/21 1130) Pulse Rate: 95 (03/21 1130) Intake/Output from previous day: 03/20 0701 - 03/21 0700 In: 250 [P.O.:250] Out: -  Intake/Output from this shift:    Labs:  Recent Labs  10/12/13 0500 10/13/13 0500  WBC 7.6 9.8  HGB 9.2* 9.1*  PLT 411* 430*  CREATININE 5.73* 3.89*   Estimated Creatinine Clearance: 16.2 ml/min (by C-G formula based on Cr of 3.89).  Recent Labs  10/12/13 1615  VANCORANDOM 18.9     Microbiology: Recent Results (from the past 720 hour(s))  CULTURE, BLOOD (ROUTINE X 2)     Status: None   Collection Time    10/01/13 11:35 AM      Result Value Ref Range Status   Specimen Description BLOOD RIGHT WRIST   Final   Special Requests BOTTLES DRAWN AEROBIC ONLY 10CC   Final   Culture  Setup Time     Final   Value: 10/01/2013 19:00     Performed at [facility]   Culture     Final   Value: NO GROWTH 5 DAYS     Performed at [facility]   Report Status 10/07/2013 FINAL   Final  CULTURE, BLOOD (ROUTINE X 2)     Status: None   Collection Time    10/01/13 12:01 PM      Result Value Ref Range Status   Specimen Description BLOOD RIGHT FOREARM   Final   Special Requests BOTTLES DRAWN AEROBIC AND ANAEROBIC 5CC   Final   Culture  Setup Time     Final   Value: 10/01/2013 18:59     Performed at [facility]   Culture     Final   Value: NO GROWTH 5 DAYS     Performed at [facility]   Report Status 10/07/2013 FINAL   Final  CSF CULTURE     Status: None   Collection Time    10/02/13 10:55 AM      Result Value Ref Range Status   Specimen Description CSF   Final   Special Requests Normal   Final   Gram Stain      Final   Value: FEW WBC PRESENT, PREDOMINANTLY MONONUCLEAR     NO ORGANISMS SEEN     Performed at [facility]   Culture     Final   Value: NO GROWTH 3 DAYS     Performed at [facility]   Report Status 10/06/2013 FINAL   Final  GRAM STAIN     Status: None   Collection Time    10/02/13 10:55 AM      Result Value Ref Range Status   Specimen Description CSF   Final   Special Requests Normal   Final   Gram Stain     Final   Value: CYTOSPIN PREP     WBC PRESENT, PREDOMINANTLY MONONUCLEAR     NO ORGANISMS SEEN   Report Status 10/02/2013 FINAL   Final  MRSA PCR SCREENING     Status: None   Collection Time    10/02/13 11:18 PM      Result Value Ref Range Status   MRSA by PCR NEGATIVE  NEGATIVE  Final   Comment:            The GeneXpert MRSA Assay (FDA     approved for NASAL specimens     only), is one component of a     comprehensive MRSA colonization     surveillance program. It is not     intended to diagnose MRSA     infection nor to guide or     monitor treatment for     MRSA infections.  CLOSTRIDIUM DIFFICILE BY PCR     Status: None   Collection Time    10/05/13  2:05 AM      Result Value Ref Range Status   C difficile by pcr NEGATIVE  NEGATIVE Final  CULTURE, BLOOD (ROUTINE X 2)     Status: None   Collection Time    10/05/13 10:42 PM      Result Value Ref Range Status   Specimen Description BLOOD RIGHT UPPER ARM   Final   Special Requests BOTTLES DRAWN AEROBIC AND ANAEROBIC 10CC   Final   Culture  Setup Time     Final   Value: 10/06/2013 03:56     Performed at [facility]   Culture     Final   Value: NO GROWTH 5 DAYS     Performed at [facility]   Report Status 10/12/2013 FINAL   Final  CULTURE, BLOOD (ROUTINE X 2)     Status: None   Collection Time    10/05/13 10:56 PM      Result Value Ref Range Status   Specimen Description BLOOD RIGHT FOREARM   Final   Special Requests BOTTLES DRAWN AEROBIC AND ANAEROBIC 10CC   Final    Culture  Setup Time     Final   Value: 10/06/2013 03:56     Performed at [facility]   Culture     Final   Value: NO GROWTH 5 DAYS     Performed at [facility]   Report Status 10/12/2013 FINAL   Final    Medical History: Past Medical History  Diagnosis Date  . ESRD on hemodialysis 05/05/2007    ESRD due to DM/HTN. Started dialysis in November 2013.  HD TTS at [facility] on [facility].  [name] BACK PAIN, LUMBAR, CHRONIC 08/06/2009  . BENIGN PROSTATIC HYPERTROPHY 08/01/2010  . CEREBROVASCULAR ACCIDENT, HX OF 08/06/2009  . CHOLELITHIASIS 08/01/2010  . CONGESTIVE HEART FAILURE 03/18/2009  . DEPRESSION 03/18/2009  . DIABETES MELLITUS, TYPE II 03/25/2007  . ERECTILE DYSFUNCTION 03/25/2007  . GERD 03/25/2007  . HEPATITIS C, HX OF 03/25/2007  . HYPERTENSION 03/25/2007  . Morbid obesity 03/25/2007  . NEPHROLITHIASIS, HX OF 03/18/2009  . Complication of anesthesia     wife states pt had trouble waking up with his last surgery in Nov., 2014    Medications:  Scheduled:  . bisacodyl  5 mg Oral BID  . darbepoetin (ARANESP) injection - DIALYSIS  25 mcg Intravenous Q Tue-HD  . feeding supplement (NEPRO CARB STEADY)  237 mL Oral BID BM  . feeding supplement (PRO-STAT SUGAR FREE 64)  30 mL Oral BID  . heparin  5,000 Units Subcutaneous 3 times per day  . insulin aspart  0-20 Units Subcutaneous 6 times per day  . labetalol  300 mg Oral BID  . multivitamin  1 tablet Oral QHS  . neomycin-bacitracin-polymyxin   Topical TID  . ondansetron (ZOFRAN) IV  4 mg Intravenous 3 times per day  .  sodium chloride  3 mL Intravenous Q12H   Assessment: 18 yoM to restart vanc/zosyn for fever of unknown origin.  S/P R AKA. Patient on HD TTHS.  Tmax 100.4.   Goal of Therapy:  pre-HD level 15-25 mcg/mL  Plan:  Restart Zosyn 2.25 g Q8h Vanc 1000mg  QTTHS with HD F/u renal plans/tolerating HD?, C&S, clinical status   Thank you, Vivia Ewing, PharmD Clinical Pharmacist - Resident Pager:  [phone] Pharmacy: [phone] 10/14/2013 2:51 PM

## 2013-10-14 NOTE — Progress Notes (Signed)
Assessment/Plan:  1. AMS  2. SP RBKA with R AKA abscess and osteom.= on vanco /zosyn  3. ESRD - TTS - HD per routine for HD today 4. Nutrition - poor intake  5. PVD with bilateral BKAs  6. Hx CVA  7. Hep C + LFT ok  8. FTT/debiliatated/from SNF - palliative care has seen -   Subjective: Interval History: none.  Objective: Vital signs in last 24 hours: Temp:  [98.2 F (36.8 C)-100.4 F (38 C)] 99.7 F (37.6 C) (03/21 1130) Pulse Rate:  [94-101] 95 (03/21 1130) Resp:  [18-19] 18 (03/21 1130) BP: (136-167)/(75-82) 167/82 mmHg (03/21 1130) SpO2:  [98 %-100 %] 99 % (03/21 1130) Weight:  [84.5 kg (186 lb 4.6 oz)] 84.5 kg (186 lb 4.6 oz) (03/20 2058) Weight change: -0.5 kg (-1 lb 1.6 oz)  Intake/Output from previous day: 03/20 0701 - 03/21 0700 In: 250 [P.O.:250] Out: -  Intake/Output this shift:    General appearance: slowed mentation and limited intreraction  due to mental state Neurologic: Mental status: Alert, oriented, thought content appropriate, alertness: no sustainable conversation, mumbles Wife is trying to   Lab Results:  Recent Labs  10/12/13 0500 10/13/13 0500  WBC 7.6 9.8  HGB 9.2* 9.1*  HCT 28.2* 28.5*  PLT 411* 430*   BMET:  Recent Labs  10/12/13 0500 10/13/13 0500  NA 138 140  K 5.1 4.2  CL 94* 98  CO2 24 25  GLUCOSE 126* 128*  BUN 31* 16  CREATININE 5.73* 3.89*  CALCIUM 9.4 9.6   No results found for this basename: PTH,  in the last 72 hours Iron Studies: No results found for this basename: IRON, TIBC, TRANSFERRIN, FERRITIN,  in the last 72 hours Studies/Results: Dg Chest Port 1 View  10/13/2013   CLINICAL DATA:  Atelectasis, pneumonia  EXAM: PORTABLE CHEST - 1 VIEW  COMPARISON:  10/02/2013  FINDINGS: Cardiomediastinal silhouette is stable. No acute infiltrate or pleural effusion. No pulmonary edema. Bony thorax is unremarkable.  IMPRESSION: No active disease.   Electronically Signed   By: Lahoma Crocker M.D.   On: 10/13/2013 10:20    Scheduled: . bisacodyl  5 mg Oral BID  . darbepoetin (ARANESP) injection - DIALYSIS  25 mcg Intravenous Q Tue-HD  . feeding supplement (NEPRO CARB STEADY)  237 mL Oral BID BM  . feeding supplement (PRO-STAT SUGAR FREE 64)  30 mL Oral BID  . heparin  5,000 Units Subcutaneous 3 times per day  . insulin aspart  0-20 Units Subcutaneous 6 times per day  . labetalol  300 mg Oral BID  . multivitamin  1 tablet Oral QHS  . neomycin-bacitracin-polymyxin   Topical TID  . ondansetron (ZOFRAN) IV  4 mg Intravenous 3 times per day  . sodium chloride  3 mL Intravenous Q12H     LOS: 13 days   Oscar Castillo C 10/14/2013,1:47 PM

## 2013-10-14 NOTE — Consult Note (Signed)
[facility] Face-to-Face Psychiatry Consult   Reason for Consult:  delerium versus dementia or depression Referring Physician:  Attending MD REMBERTO LIENHARD is an 63 y.o. male. Total Time spent with patient: 15 minutes  Assessment: AXIS I:  Depressive Disorder secondary to general medical condition AXIS II:  Deferred AXIS III:   Past Medical History  Diagnosis Date  . ESRD on hemodialysis 05/05/2007    ESRD due to DM/HTN. Started dialysis in November 2013.  HD TTS at [facility] on [address].  [name] BACK PAIN, LUMBAR, CHRONIC 08/06/2009  . BENIGN PROSTATIC HYPERTROPHY 08/01/2010  . CEREBROVASCULAR ACCIDENT, HX OF 08/06/2009  . CHOLELITHIASIS 08/01/2010  . CONGESTIVE HEART FAILURE 03/18/2009  . DEPRESSION 03/18/2009  . DIABETES MELLITUS, TYPE II 03/25/2007  . ERECTILE DYSFUNCTION 03/25/2007  . GERD 03/25/2007  . HEPATITIS C, HX OF 03/25/2007  . HYPERTENSION 03/25/2007  . Morbid obesity 03/25/2007  . NEPHROLITHIASIS, HX OF 03/18/2009  . Complication of anesthesia     wife states pt had trouble waking up with his last surgery in Nov., 2014   AXIS IV:  other psychosocial or environmental problems AXIS V:  41-50 serious symptoms  Plan:  Patient does not meet criteria for psychiatric inpatient admission.  Subjective:   WILFERD RITSON is a 63 y.o. male patient admitted with altered mental statis, osteomyelitis, ESRD. [name]  HPI:  Pt apparently deteriorated since surgery in February. He has stopped eating and interacting. Ct scan of brain shows mild diffuse atrophy so mild underlying dementia is possible. However, decline in mental status has been abrupt. Numerous medical problems are contributing to alteration in cognition. By wife's report, more depressed and withdrawn. HPI Elements:   Location:  generalized. Quality:  acute. Severity:  severe. Timing:  last 2 months. Duration:  last 2 months. Context:  worsening medical status, recent surgery.  Past Psychiatric History: Past Medical History  Diagnosis  Date  . ESRD on hemodialysis 05/05/2007    ESRD due to DM/HTN. Started dialysis in November 2013.  HD TTS at [facility] on [address].  [name] BACK PAIN, LUMBAR, CHRONIC 08/06/2009  . BENIGN PROSTATIC HYPERTROPHY 08/01/2010  . CEREBROVASCULAR ACCIDENT, HX OF 08/06/2009  . CHOLELITHIASIS 08/01/2010  . CONGESTIVE HEART FAILURE 03/18/2009  . DEPRESSION 03/18/2009  . DIABETES MELLITUS, TYPE II 03/25/2007  . ERECTILE DYSFUNCTION 03/25/2007  . GERD 03/25/2007  . HEPATITIS C, HX OF 03/25/2007  . HYPERTENSION 03/25/2007  . Morbid obesity 03/25/2007  . NEPHROLITHIASIS, HX OF 03/18/2009  . Complication of anesthesia     wife states pt had trouble waking up with his last surgery in Nov., 2014    reports that he has never smoked. He has never used smokeless tobacco. He reports that he does not drink alcohol or use illicit drugs. Family History  Problem Relation Age of Onset  . Coronary artery disease Other   . Diabetes Other      Living Arrangements: Spouse/significant other   Abuse/Neglect [facility]) Physical Abuse: Denies Verbal Abuse: Denies Sexual Abuse: Denies Allergies:  No Known Allergies    Place of Residence: Nursing home Marital Status: married Employed/Unemployed:  Retired Chief Financial Officer Education:  B.S. Family Supports:  wife Objective: Blood pressure 167/82, pulse 95, temperature 99.7 F (37.6 C), temperature source Oral, resp. rate 18, height 4' 8" (1.422 m), weight 186 lb 4.6 oz (84.5 kg), SpO2 99.00%.Body mass index is 41.79 kg/(m^2). Results for orders placed during the hospital encounter of 10/01/13 (from the past 72 hour(s))  GLUCOSE, CAPILLARY  Status: Abnormal   Collection Time    10/11/13  5:24 PM      Result Value Ref Range   Glucose-Capillary 144 (*) 70 - 99 mg/dL   Comment 1 Notify RN     Comment 2 Documented in Chart    GLUCOSE, CAPILLARY     Status: Abnormal   Collection Time    10/11/13  8:08 PM      Result Value Ref Range   Glucose-Capillary 116 (*) 70 - 99 mg/dL   GLUCOSE, CAPILLARY     Status: Abnormal   Collection Time    10/12/13 12:14 AM      Result Value Ref Range   Glucose-Capillary 111 (*) 70 - 99 mg/dL  GLUCOSE, CAPILLARY     Status: Abnormal   Collection Time    10/12/13  4:41 AM      Result Value Ref Range   Glucose-Capillary 109 (*) 70 - 99 mg/dL  COMPREHENSIVE METABOLIC PANEL     Status: Abnormal   Collection Time    10/12/13  5:00 AM      Result Value Ref Range   Sodium 138  137 - 147 mEq/L   Potassium 5.1  3.7 - 5.3 mEq/L   Chloride 94 (*) 96 - 112 mEq/L   CO2 24  19 - 32 mEq/L   Glucose, Bld 126 (*) 70 - 99 mg/dL   BUN 31 (*) 6 - 23 mg/dL   Creatinine, Ser 5.73 (*) 0.50 - 1.35 mg/dL   Calcium 9.4  8.4 - 10.5 mg/dL   Total Protein 7.5  6.0 - 8.3 g/dL   Albumin 2.4 (*) 3.5 - 5.2 g/dL   AST 23  0 - 37 U/L   ALT 14  0 - 53 U/L   Alkaline Phosphatase 62  39 - 117 U/L   Total Bilirubin 0.5  0.3 - 1.2 mg/dL   GFR calc non Af Amer 10 (*) >90 mL/min   GFR calc Af Amer 11 (*) >90 mL/min   Comment: (NOTE)     The eGFR has been calculated using the CKD EPI equation.     This calculation has not been validated in all clinical situations.     eGFR's persistently <90 mL/min signify possible Chronic Kidney     Disease.  CBC WITH DIFFERENTIAL     Status: Abnormal   Collection Time    10/12/13  5:00 AM      Result Value Ref Range   WBC 7.6  4.0 - 10.5 K/uL   RBC 3.36 (*) 4.22 - 5.81 MIL/uL   Hemoglobin 9.2 (*) 13.0 - 17.0 g/dL   HCT 28.2 (*) 39.0 - 52.0 %   MCV 83.9  78.0 - 100.0 fL   MCH 27.4  26.0 - 34.0 pg   MCHC 32.6  30.0 - 36.0 g/dL   RDW 19.2 (*) 11.5 - 15.5 %   Platelets 411 (*) 150 - 400 K/uL   Neutrophils Relative % 71  43 - 77 %   Neutro Abs 5.4  1.7 - 7.7 K/uL   Lymphocytes Relative 15  12 - 46 %   Lymphs Abs 1.1  0.7 - 4.0 K/uL   Monocytes Relative 14 (*) 3 - 12 %   Monocytes Absolute 1.1 (*) 0.1 - 1.0 K/uL   Eosinophils Relative 1  0 - 5 %   Eosinophils Absolute 0.1  0.0 - 0.7 K/uL   Basophils Relative 0  0  - 1 %   Basophils Absolute   0.0  0.0 - 0.1 K/uL  MAGNESIUM     Status: None   Collection Time    10/12/13  5:00 AM      Result Value Ref Range   Magnesium 2.0  1.5 - 2.5 mg/dL  GLUCOSE, CAPILLARY     Status: Abnormal   Collection Time    10/12/13  7:39 AM      Result Value Ref Range   Glucose-Capillary 119 (*) 70 - 99 mg/dL  GLUCOSE, CAPILLARY     Status: Abnormal   Collection Time    10/12/13  8:34 AM      Result Value Ref Range   Glucose-Capillary 120 (*) 70 - 99 mg/dL  GLUCOSE, CAPILLARY     Status: Abnormal   Collection Time    10/12/13  1:43 PM      Result Value Ref Range   Glucose-Capillary 139 (*) 70 - 99 mg/dL  VANCOMYCIN, RANDOM     Status: None   Collection Time    10/12/13  4:15 PM      Result Value Ref Range   Vancomycin Rm 18.9     Comment:            Random Vancomycin therapeutic     range is dependent on dosage and     time of specimen collection.     A peak range is 20.0-40.0 ug/mL     A trough range is 5.0-15.0 ug/mL             GLUCOSE, CAPILLARY     Status: Abnormal   Collection Time    10/12/13  4:55 PM      Result Value Ref Range   Glucose-Capillary 209 (*) 70 - 99 mg/dL  GLUCOSE, CAPILLARY     Status: Abnormal   Collection Time    10/12/13  8:17 PM      Result Value Ref Range   Glucose-Capillary 112 (*) 70 - 99 mg/dL  GLUCOSE, CAPILLARY     Status: Abnormal   Collection Time    10/13/13 12:02 AM      Result Value Ref Range   Glucose-Capillary 116 (*) 70 - 99 mg/dL  GLUCOSE, CAPILLARY     Status: Abnormal   Collection Time    10/13/13  4:20 AM      Result Value Ref Range   Glucose-Capillary 126 (*) 70 - 99 mg/dL  COMPREHENSIVE METABOLIC PANEL     Status: Abnormal   Collection Time    10/13/13  5:00 AM      Result Value Ref Range   Sodium 140  137 - 147 mEq/L   Potassium 4.2  3.7 - 5.3 mEq/L   Comment: DELTA CHECK NOTED   Chloride 98  96 - 112 mEq/L   CO2 25  19 - 32 mEq/L   Glucose, Bld 128 (*) 70 - 99 mg/dL   BUN 16  6 - 23 mg/dL    Comment: DELTA CHECK NOTED   Creatinine, Ser 3.89 (*) 0.50 - 1.35 mg/dL   Calcium 9.6  8.4 - 10.5 mg/dL   Total Protein 7.8  6.0 - 8.3 g/dL   Albumin 2.3 (*) 3.5 - 5.2 g/dL   AST 26  0 - 37 U/L   ALT 13  0 - 53 U/L   Alkaline Phosphatase 59  39 - 117 U/L   Total Bilirubin 0.5  0.3 - 1.2 mg/dL   GFR calc non Af Amer 15 (*) >90 mL/min   GFR calc Af   Amer 18 (*) >90 mL/min   Comment: (NOTE)     The eGFR has been calculated using the CKD EPI equation.     This calculation has not been validated in all clinical situations.     eGFR's persistently <90 mL/min signify possible Chronic Kidney     Disease.  CBC WITH DIFFERENTIAL     Status: Abnormal   Collection Time    10/13/13  5:00 AM      Result Value Ref Range   WBC 9.8  4.0 - 10.5 K/uL   RBC 3.32 (*) 4.22 - 5.81 MIL/uL   Hemoglobin 9.1 (*) 13.0 - 17.0 g/dL   HCT 28.5 (*) 39.0 - 52.0 %   MCV 85.8  78.0 - 100.0 fL   MCH 27.4  26.0 - 34.0 pg   MCHC 31.9  30.0 - 36.0 g/dL   RDW 20.0 (*) 11.5 - 15.5 %   Platelets 430 (*) 150 - 400 K/uL   Neutrophils Relative % 63  43 - 77 %   Neutro Abs 6.1  1.7 - 7.7 K/uL   Lymphocytes Relative 19  12 - 46 %   Lymphs Abs 1.9  0.7 - 4.0 K/uL   Monocytes Relative 18 (*) 3 - 12 %   Monocytes Absolute 1.7 (*) 0.1 - 1.0 K/uL   Eosinophils Relative 0  0 - 5 %   Eosinophils Absolute 0.0  0.0 - 0.7 K/uL   Basophils Relative 0  0 - 1 %   Basophils Absolute 0.0  0.0 - 0.1 K/uL  MAGNESIUM     Status: None   Collection Time    10/13/13  5:00 AM      Result Value Ref Range   Magnesium 2.1  1.5 - 2.5 mg/dL  GLUCOSE, CAPILLARY     Status: Abnormal   Collection Time    10/13/13  8:15 AM      Result Value Ref Range   Glucose-Capillary 136 (*) 70 - 99 mg/dL   Comment 1 Notify RN     Comment 2 Documented in Chart    FOLATE     Status: None   Collection Time    10/13/13 10:35 AM      Result Value Ref Range   Folate 4.6     Comment: (NOTE)     Reference Ranges            Deficient:       0.4 - 3.3 ng/mL             Indeterminate:   3.4 - 5.4 ng/mL            Normal:              > 5.4 ng/mL     Performed at [facility]  VITAMIN B12     Status: None   Collection Time    10/13/13 10:35 AM      Result Value Ref Range   Vitamin B-12 634  211 - 911 pg/mL   Comment: Performed at [facility]  TSH     Status: None   Collection Time    10/13/13 10:35 AM      Result Value Ref Range   TSH 1.028  0.350 - 4.500 uIU/mL   Comment: Performed at [facility]  RPR     Status: None   Collection Time    10/13/13 10:35 AM      Result Value Ref Range   RPR NON  REACTIVE  NON REACTIVE   Comment: Performed at [facility]  HIV ANTIBODY (ROUTINE TESTING)     Status: None   Collection Time    10/13/13 10:35 AM      Result Value Ref Range   HIV NON REACTIVE  NON REACTIVE   Comment: (NOTE)     Effective October 30, 2013, [facility] will no longer offer the     current 3rd Generation HIV diagnostic screening assay, HIV Antibodies,     HIV-1/2 EIA, with reflexes. At that time, [facility] will     only offer HIV-1/2 Ag/Ab, 4th Gen, w/ Reflexes as recommended by the     CDC. This HIV diagnostic screening assay tests for antibodies to HIV-1     and HIV-2 as well as HIV p24 antigen and provides greater sensitivity     for the detection of recent infection. Any orders for the 3rd     Generation assay will automatically be referred to the 4th Generation     assay.     Performed at [facility]  GLUCOSE, CAPILLARY     Status: Abnormal   Collection Time    10/13/13 12:16 PM      Result Value Ref Range   Glucose-Capillary 111 (*) 70 - 99 mg/dL   Comment 1 Notify RN     Comment 2 Documented in Chart    GLUCOSE, CAPILLARY     Status: Abnormal   Collection Time    10/13/13  4:26 PM      Result Value Ref Range   Glucose-Capillary 180 (*) 70 - 99 mg/dL   Comment 1 Notify RN     Comment 2 Documented in Chart    GLUCOSE, CAPILLARY     Status: Abnormal    Collection Time    10/13/13  8:54 PM      Result Value Ref Range   Glucose-Capillary 136 (*) 70 - 99 mg/dL  GLUCOSE, CAPILLARY     Status: Abnormal   Collection Time    10/14/13 12:02 AM      Result Value Ref Range   Glucose-Capillary 108 (*) 70 - 99 mg/dL  GLUCOSE, CAPILLARY     Status: Abnormal   Collection Time    10/14/13  4:16 AM      Result Value Ref Range   Glucose-Capillary 110 (*) 70 - 99 mg/dL  GLUCOSE, CAPILLARY     Status: Abnormal   Collection Time    10/14/13  8:08 AM      Result Value Ref Range   Glucose-Capillary 114 (*) 70 - 99 mg/dL  GLUCOSE, CAPILLARY     Status: Abnormal   Collection Time    10/14/13 11:44 AM      Result Value Ref Range   Glucose-Capillary 128 (*) 70 - 99 mg/dL   Labs are reviewed and are pertinent for multiple medical issues  Current Facility-Administered Medications  Medication Dose Route Frequency Provider Last Rate Last Dose  . 0.9 %  sodium chloride infusion  100 mL Intravenous PRN Martha B. Bergman, PA-C      . acetaminophen (TYLENOL) suppository 650 mg  650 mg Rectal Q6H PRN Karen J Kirby-Graham, NP   650 mg at 10/14/13 0022  . acetaminophen (TYLENOL) tablet 650 mg  650 mg Oral Q6H PRN Karen J Kirby-Graham, NP   650 mg at 10/13/13 0446  . bisacodyl (DULCOLAX) EC tablet 5 mg  5 mg Oral BID Curtis J Woods, MD     5 mg at 10/14/13 1129  . darbepoetin (ARANESP) injection 25 mcg  25 mcg Intravenous Q Tue-HD Martha B. Bergman, PA-C   25 mcg at 10/10/13 0917  . dextrose 5 %-0.9 % sodium chloride infusion   Intravenous Continuous Curtis J Woods, MD 20 mL/hr at 10/08/13 1818 20 mL at 10/08/13 1818  . feeding supplement (NEPRO CARB STEADY) liquid 237 mL  237 mL Oral BID BM Catherine C Lamberton, RD 0 mL/hr at 10/12/13 1516 237 mL at 10/14/13 1130  . feeding supplement (PRO-STAT SUGAR FREE 64) liquid 30 mL  30 mL Oral BID Samantha J Worley, RD   30 mL at 10/13/13 2200  . haloperidol (HALDOL) tablet 1 mg  1 mg Oral Q6H PRN Robert D Schertz, MD   1  mg at 10/11/13 1515  . heparin injection 5,000 Units  5,000 Units Subcutaneous 3 times per day Jai-Gurmukh Samtani, MD   5,000 Units at 10/14/13 0700  . hydrALAZINE (APRESOLINE) injection 5 mg  5 mg Intravenous Q4H PRN Curtis J Woods, MD   5 mg at 10/12/13 0527  . insulin aspart (novoLOG) injection 0-20 Units  0-20 Units Subcutaneous 6 times per day Curtis J Woods, MD   1 Units at 10/13/13 2035  . labetalol (NORMODYNE) tablet 300 mg  300 mg Oral BID Saima Rizwan, MD   300 mg at 10/13/13 1220  . metoCLOPramide (REGLAN) tablet 5 mg  5 mg Oral Q8H PRN Marcus V Duda, MD       Or  . metoCLOPramide (REGLAN) injection 5 mg  5 mg Intravenous Q8H PRN Marcus V Duda, MD      . morphine 2 MG/ML injection 1-2 mg  1-2 mg Intravenous Q4H PRN Curtis J Woods, MD   2 mg at 10/12/13 1421  . multivitamin (RENA-VIT) tablet 1 tablet  1 tablet Oral QHS David W Zeyfang, PA-C      . neomycin-bacitracin-polymyxin (NEOSPORIN) ointment   Topical TID Saima Rizwan, MD      . ondansetron (ZOFRAN) injection 4 mg  4 mg Intravenous 3 times per day Curtis J Woods, MD   4 mg at 10/14/13 0020  . ondansetron (ZOFRAN) tablet 4 mg  4 mg Oral Q6H PRN Marcus V Duda, MD       Or  . ondansetron (ZOFRAN) injection 4 mg  4 mg Intravenous Q6H PRN Marcus V Duda, MD   4 mg at 10/14/13 0700  . piperacillin-tazobactam (ZOSYN) IVPB 2.25 g  2.25 g Intravenous 3 times per day Kelley L Miller, RPH      . sodium chloride 0.9 % injection 3 mL  3 mL Intravenous Q12H Jai-Gurmukh Samtani, MD   3 mL at 10/13/13 1224  . vancomycin (VANCOCIN) IVPB 1000 mg/200 mL premix  1,000 mg Intravenous Q T,Th,Sa-HD Kelley L Miller, RPH        Psychiatric Specialty Exam:     Blood pressure 167/82, pulse 95, temperature 99.7 F (37.6 C), temperature source Oral, resp. rate 18, height 4' 8" (1.422 m), weight 186 lb 4.6 oz (84.5 kg), SpO2 99.00%.Body mass index is 41.79 kg/(m^2).  General Appearance: Casual  Eye Contact::  None  Speech:  Slow  Volume:  Decreased   Mood:  Depressed  Affect:  Flat  Thought Process:  can't assess due to paucity of speech  Orientation:  Other:  couldn't answer questions  Thought Content:  can't assess  Suicidal Thoughts: not able to assess  Homicidal Thoughts:  Not able to assess  Memory:  NA    Judgement:  Poor  Insight:  Lacking  Psychomotor Activity:  Decreased and Psychomotor Retardation  Concentration:  Poor  Recall:  Poor  Fund of Knowledge:Poor  Language: Poor  Akathisia:  No  Handed:  Right  AIMS (if indicated):     Assets:  Social Support  Sleep:      Musculoskeletal: Strength & Muscle Tone: unable to assess Gait & Station: unable to stand Patient leans: N/A  Treatment Plan Summary: Pt is probably depressed due to overwhelming medical issues and loss of function. Can try adding a liquid antidepressant such as Prozac. Not appropriate for admit to psychiatry.  ROSS, DEBORAH MD 10/14/2013 4:27 PM 

## 2013-10-15 ENCOUNTER — Inpatient Hospital Stay (HOSPITAL_COMMUNITY): Payer: Medicare Other

## 2013-10-15 LAB — GLUCOSE, CAPILLARY
GLUCOSE-CAPILLARY: 111 mg/dL — AB (ref 70–99)
Glucose-Capillary: 108 mg/dL — ABNORMAL HIGH (ref 70–99)
Glucose-Capillary: 113 mg/dL — ABNORMAL HIGH (ref 70–99)
Glucose-Capillary: 116 mg/dL — ABNORMAL HIGH (ref 70–99)
Glucose-Capillary: 124 mg/dL — ABNORMAL HIGH (ref 70–99)
Glucose-Capillary: 124 mg/dL — ABNORMAL HIGH (ref 70–99)

## 2013-10-15 LAB — COMPREHENSIVE METABOLIC PANEL
ALT: 15 U/L (ref 0–53)
AST: 31 U/L (ref 0–37)
Albumin: 2.6 g/dL — ABNORMAL LOW (ref 3.5–5.2)
Alkaline Phosphatase: 66 U/L (ref 39–117)
BILIRUBIN TOTAL: 0.6 mg/dL (ref 0.3–1.2)
BUN: 19 mg/dL (ref 6–23)
CALCIUM: 9.5 mg/dL (ref 8.4–10.5)
CHLORIDE: 91 meq/L — AB (ref 96–112)
CO2: 27 meq/L (ref 19–32)
Creatinine, Ser: 3.92 mg/dL — ABNORMAL HIGH (ref 0.50–1.35)
GFR, EST AFRICAN AMERICAN: 17 mL/min — AB (ref >90)
GFR, EST NON AFRICAN AMERICAN: 15 mL/min — AB (ref >90)
GLUCOSE: 109 mg/dL — AB (ref 70–99)
Potassium: 4.7 mEq/L (ref 3.7–5.3)
SODIUM: 138 meq/L (ref 137–147)
Total Protein: 9.1 g/dL — ABNORMAL HIGH (ref 6.0–8.3)

## 2013-10-15 LAB — CBC WITH DIFFERENTIAL/PLATELET
Basophils Absolute: 0 10*3/uL (ref 0.0–0.1)
Basophils Relative: 0 % (ref 0–1)
EOS ABS: 0.1 10*3/uL (ref 0.0–0.7)
Eosinophils Relative: 1 % (ref 0–5)
HCT: 30.8 % — ABNORMAL LOW (ref 39.0–52.0)
Hemoglobin: 10 g/dL — ABNORMAL LOW (ref 13.0–17.0)
Lymphocytes Relative: 19 % (ref 12–46)
Lymphs Abs: 1.7 10*3/uL (ref 0.7–4.0)
MCH: 27.9 pg (ref 26.0–34.0)
MCHC: 32.5 g/dL (ref 30.0–36.0)
MCV: 85.8 fL (ref 78.0–100.0)
MONOS PCT: 11 % (ref 3–12)
Monocytes Absolute: 1 10*3/uL (ref 0.1–1.0)
NEUTROS PCT: 69 % (ref 43–77)
Neutro Abs: 6.2 10*3/uL (ref 1.7–7.7)
Platelets: 455 10*3/uL — ABNORMAL HIGH (ref 150–400)
RBC: 3.59 MIL/uL — ABNORMAL LOW (ref 4.22–5.81)
RDW: 20.5 % — ABNORMAL HIGH (ref 11.5–15.5)
WBC: 9 10*3/uL (ref 4.0–10.5)

## 2013-10-15 LAB — MAGNESIUM: MAGNESIUM: 2.2 mg/dL (ref 1.5–2.5)

## 2013-10-15 MED ORDER — NEPRO/CARBSTEADY PO LIQD
1000.0000 mL | ORAL | Status: DC
  Administered 2013-10-15: 1000 mL
  Filled 2013-10-15 (×3): qty 1000

## 2013-10-15 MED ORDER — IOHEXOL 300 MG/ML  SOLN
25.0000 mL | INTRAMUSCULAR | Status: AC
  Administered 2013-10-15 (×2): 25 mL via ORAL

## 2013-10-15 MED ORDER — IOHEXOL 300 MG/ML  SOLN
100.0000 mL | Freq: Once | INTRAMUSCULAR | Status: AC | PRN
  Administered 2013-10-15: 100 mL via INTRAVENOUS

## 2013-10-15 NOTE — Progress Notes (Signed)
Brief Nutrition Note  Consult received for enteral/tube feeding initiation and management.  Adult Enteral Nutrition Protocol initiated. Full assessment to follow. Patient with ESRD on HD.   Admitting Dx: Altered mental status [780.97] Fever [780.60] Sepsis [038.9, 995.91]  Body mass index is 39.07 kg/(m^2). Pt meets criteria for Obesity, Class II based on current BMI.  Labs:   Recent Labs Lab 10/10/13 0800  10/13/13 0500 10/14/13 1659 10/15/13 0844  NA 141  < > 140 143 138  K 5.2  < > 4.2 4.8 4.7  CL 98  < > 98 100 91*  CO2 25  < > 25 26 27   BUN 42*  < > 16 33* 19  CREATININE 6.61*  < > 3.89* 7.01* 3.92*  CALCIUM 9.3  < > 9.6 9.3 9.5  MG 2.2  < > 2.1 2.4 2.2  PHOS 5.7*  --   --  6.5*  --   GLUCOSE 125*  < > 128* 114* 109*  < > = values in this interval not displayed.  Larey Seat, RD, LDN Pager #: [phone] After-Hours Pager #: [phone]

## 2013-10-15 NOTE — Progress Notes (Signed)
[facility] Progress Note  Subjective:   Hi, how are you....but then could not answer questions or follow simple command like squeezing my hand  Objective Filed Vitals:   10/14/13 2125 10/14/13 2157 10/15/13 0420 10/15/13 0807  BP: 141/89 129/82 139/88 135/81  Pulse: 97 99 98 102  Temp: 97.6 F (36.4 C) 98.9 F (37.2 C) 99.1 F (37.3 C) 100.6 F (38.1 C)  TempSrc: Axillary Oral Oral Oral  Resp: 18 18 18 18   Height:      Weight: 79 kg (174 lb 2.6 oz)     SpO2: 97% 99% 96% 100%   Physical Exam General: starring, awake Heart: tachy  Lungs: dim BS Abdomen: soft Extremities: right BKA wrapped; left few scabs no overt edema Dialysis Access: left AVF + bruit  [facility]  4h 22min 72.5kg 2/2.0 Bath LUA AVF Heparin 5000 / 2400  Hectorol 1 Epo none Venofer none   Assessment/Plan:  1. AMS - no improvement; wife believes related to infection; temp up to 100.6 today 2.  Fever - for abdom CT with contrast, no increase in WBC - has been on Vanc and zosyn 2. SP RBKA with R AKA abscess/bone osteonecrosis 3/18.= on vanco /zosyn  3. ESRD - TTS - HD per routine; weights highly variable - likely not accurate  4. Nutrition - poor intake - NG placed, NPO  - wife said because he clamps down when you start to feed him 5. PVD with bilateral BKAs s/p revision of right BKA 6. Hx CVA  7. Hep C + LFT ok  8. FTT/debiliatated/from SNF - palliative care has seen -I also revisited the appropriateness of continuing dialysis and the role of HD is to improve QOL not just keep someone from dying. 9. Depression - seen by psych - rec liquid prozac 10. Anemia - Hgb 10 Aranesp 25 Q Tuesday 11. HTN/ volume UF 2.3 3/21   Myriam Jacobson, PA-C [facility] Beeper [phone] 10/15/2013,11:29 AM  LOS: 14 days   Renal Attending: As above, agree with eval and mangaement as articulated by Ms. Bergman.  I do not know this patient's baseline status but he is almost catatonic in  his presentation.  Cont. Supportive treatment. Sahira Cataldi C   Additional Objective Labs: Basic Metabolic Panel:  Recent Labs Lab 10/10/13 0800  10/13/13 0500 10/14/13 1659 10/15/13 0844  NA 141  < > 140 143 138  K 5.2  < > 4.2 4.8 4.7  CL 98  < > 98 100 91*  CO2 25  < > 25 26 27   GLUCOSE 125*  < > 128* 114* 109*  BUN 42*  < > 16 33* 19  CREATININE 6.61*  < > 3.89* 7.01* 3.92*  CALCIUM 9.3  < > 9.6 9.3 9.5  PHOS 5.7*  --   --  6.5*  --   < > = values in this interval not displayed. Liver Function Tests:  Recent Labs Lab 10/12/13 0500 10/13/13 0500 10/14/13 1659 10/15/13 0844  AST 23 26  --  31  ALT 14 13  --  15  ALKPHOS 62 59  --  66  BILITOT 0.5 0.5  --  0.6  PROT 7.5 7.8  --  9.1*  ALBUMIN 2.4* 2.3* 2.3* 2.6*   CBC:  Recent Labs Lab 10/11/13 0528 10/12/13 0500 10/13/13 0500 10/14/13 1659 10/15/13 0844  WBC 6.0 7.6 9.8 8.8 9.0  NEUTROABS 3.4 5.4 6.1  --  6.2  HGB 10.7* 9.2* 9.1* 8.6*  10.0*  HCT 32.9* 28.2* 28.5* 26.4* 30.8*  MCV 84.6 83.9 85.8 85.2 85.8  PLT 371 411* 430* 461* 455*   Blood Culture    Component Value Date/Time   SDES BLOOD RIGHT HAND 10/14/2013 1550   SPECREQUEST BOTTLES DRAWN AEROBIC ONLY 5 CC 10/14/2013 1550   CULT  Value:        BLOOD CULTURE RECEIVED NO GROWTH TO DATE CULTURE WILL BE HELD FOR 5 DAYS BEFORE ISSUING A FINAL NEGATIVE REPORT Performed at [place] 10/14/2013 1550   REPTSTATUS PENDING 10/14/2013 1550   CBG:  Recent Labs Lab 10/14/13 1144 10/14/13 2155 10/15/13 0003 10/15/13 0412 10/15/13 0804  GLUCAP 128* 116* 124* 113* 111*    Studies/Results: Dg Abd Portable 1v  10/15/2013   CLINICAL DATA:  Nasogastric tube placement at bedside.  EXAM: PORTABLE ABDOMEN - 1 VIEW  COMPARISON:  10/04/2013.  FINDINGS: Nasogastric tube tip in the fundus of the stomach. Bowel gas pattern unremarkable.  IMPRESSION: Nasogastric tube tip in the fundus of the stomach. No acute abdominal abnormality.   Electronically Signed    By: Evangeline Dakin M.D.   On: 10/15/2013 03:41   Medications: . dextrose 5 % and 0.9% NaCl 20 mL (10/08/13 1818)   . bisacodyl  5 mg Oral BID  . darbepoetin (ARANESP) injection - DIALYSIS  25 mcg Intravenous Q Tue-HD  . feeding supplement (NEPRO CARB STEADY)  237 mL Oral BID BM  . feeding supplement (PRO-STAT SUGAR FREE 64)  30 mL Oral BID  . heparin  5,000 Units Subcutaneous 3 times per day  . insulin aspart  0-20 Units Subcutaneous 6 times per day  . iohexol  25 mL Oral Q1 Hr x 2  . labetalol  300 mg Oral BID  . multivitamin  1 tablet Oral QHS  . neomycin-bacitracin-polymyxin   Topical TID  . ondansetron (ZOFRAN) IV  4 mg Intravenous 3 times per day  . piperacillin-tazobactam (ZOSYN)  IV  2.25 g Intravenous 3 times per day  . sodium chloride  3 mL Intravenous Q12H  . vancomycin  1,000 mg Intravenous Q T,Th,Sa-HD

## 2013-10-15 NOTE — Progress Notes (Signed)
[facility] PROGRESS NOTE  BETTY BROOKS FIE:[id number] DOB: 12/04/1950 DOA: 10/01/2013 PCP: [name], MD  Assessment/Plan:  Encephalopathy, metabolic  -At this juncture etiology uncertain.  Thought to be secondary to infection but patient has had abscess drainage/revision of right BKA stump.  Still no improvement. RPR and HIV Non reactive, B12 and folate levels within normal limits - consulted Psychiatry for evaluation of delirium vs dementia - chest x ray obtained and reported as no active disease - Given fevers will cover with broad spectrum antibiotics. Will obtain CT of abdomen and Pelvis to look for source of infection.  New LBBB  -has been transient in nature  -systolic function normal but septal motion with abnormal function and dyssynergy  -cardiac enzymes negative    Dehydration/FTT (failure to thrive) in adult  - Speech and Land (SLP) attempted 10/02/2013 the patient unable to arouse enough to eval but later re examined and now on D1 diet  -Will start on tube feeds  HYPERTENSION  -Continue labetalol 300 mg BID  -Continue hydralazine 5 mg PRN SBP> 160 or DBP> 100   Anemia due to chronic illness  -Will continue to monitor.  DM (diabetes mellitus) type I controlled with renal manifestation  -On Lantus prior to admission  -Continue resistant SSI; CBG currently controlled on this regimen   CKD (chronic kidney disease) stage V requiring chronic dialysis  -Usual dialysis days Tuesday Thursday Saturday   Chronic combined systolic (EF 16%) and grade 2 diastolic congestive heart failure  -Compensated and managed with dialysis on T/Th/Sat   HEPATITIS C, HX OF  CEREBROVASCULAR ACCIDENT, HX OF  -No evidence of acute infarct   Hyperlipidemia  -stable  Right BKA  -Pain is increasing since surgery in February 2015. MRI 3/13 shows pathology see results below  -3/14 MRI of right BKA stump shows abscess/osteomyelitis  s/p  abscess drainage/revision of  right BKA stump on 3/18  Osteomyelitis right BKA stump  - Please see above.  - d/c IV antibiotics given recent abscess drainage/revision of R BKA  Code Status: Full  Family Communication: Discussed case with wife Disposition Plan: Awaiting improvement in medical condition, Wife would not like feeding tube placement but is ok with NG tube at this moment.  Consultants:  Nephrology   Palliative  Orthopaedic  Procedures:  As listed above  Antibiotics:  Vanc and zosyn  HPI/Subjective: Patient more alert today. No acute issues reported overnight.  Objective: Filed Vitals:   10/15/13 1216  BP: 151/86  Pulse: 101  Temp: 100.4 F (38 C)  Resp: 19    Intake/Output Summary (Last 24 hours) at 10/15/13 1411 Last data filed at 10/14/13 2125  Gross per 24 hour  Intake      0 ml  Output   2300 ml  Net  -2300 ml   Filed Weights   10/13/13 2058 10/14/13 1645 10/14/13 2125  Weight: 84.5 kg (186 lb 4.6 oz) 82 kg (180 lb 12.4 oz) 79 kg (174 lb 2.6 oz)    Exam:   General:  Pt in NAD, Alert and awake  Cardiovascular: RRR, no mrg  Respiratory: CTA BL, no wheezes, no increased wob  Abdomen: soft, ND, NT  Musculoskeletal: no cyanosis at upper extremities.   Data Reviewed: Basic Metabolic Panel:  Recent Labs Lab 10/10/13 0800 10/11/13 0528 10/12/13 0500 10/13/13 0500 10/14/13 1659 10/15/13 0844  NA 141 139 138 140 143 138  K 5.2 4.5 5.1 4.2 4.8 4.7  CL 98 96 94* 98 100 91*  CO2 25 25 24 25 26 27   GLUCOSE 125* 98 126* 128* 114* 109*  BUN 42* 19 31* 16 33* 19  CREATININE 6.61* 3.99* 5.73* 3.89* 7.01* 3.92*  CALCIUM 9.3 8.9 9.4 9.6 9.3 9.5  MG 2.2 1.9 2.0 2.1 2.4 2.2  PHOS 5.7*  --   --   --  6.5*  --    Liver Function Tests:  Recent Labs Lab 10/10/13 0800 10/11/13 0528 10/12/13 0500 10/13/13 0500 10/14/13 1659 10/15/13 0844  AST 16 22 23 26   --  31  ALT 12 14 14 13   --  15  ALKPHOS 50 58 62 59  --  66  BILITOT 0.3 0.3 0.5 0.5  --  0.6  PROT 7.0  7.6 7.5 7.8  --  9.1*  ALBUMIN 2.2* 2.3* 2.4* 2.3* 2.3* 2.6*   No results found for this basename: LIPASE, AMYLASE,  in the last 168 hours No results found for this basename: AMMONIA,  in the last 168 hours CBC:  Recent Labs Lab 10/10/13 0800 10/11/13 0528 10/12/13 0500 10/13/13 0500 10/14/13 1659 10/15/13 0844  WBC 7.3 6.0 7.6 9.8 8.8 9.0  NEUTROABS 4.3 3.4 5.4 6.1  --  6.2  HGB 9.5* 10.7* 9.2* 9.1* 8.6* 10.0*  HCT 29.1* 32.9* 28.2* 28.5* 26.4* 30.8*  MCV 83.6 84.6 83.9 85.8 85.2 85.8  PLT 367 371 411* 430* 461* 455*   Cardiac Enzymes: No results found for this basename: CKTOTAL, CKMB, CKMBINDEX, TROPONINI,  in the last 168 hours BNP (last 3 results)  Recent Labs  10/01/13 1135  PROBNP 1397.0*   CBG:  Recent Labs Lab 10/14/13 2155 10/15/13 0003 10/15/13 0412 10/15/13 0804 10/15/13 1205  GLUCAP 116* 124* 113* 111* 124*    Recent Results (from the past 240 hour(s))  CULTURE, BLOOD (ROUTINE X 2)     Status: None   Collection Time    10/05/13 10:42 PM      Result Value Ref Range Status   Specimen Description BLOOD RIGHT UPPER ARM   Final   Special Requests BOTTLES DRAWN AEROBIC AND ANAEROBIC 10CC   Final   Culture  Setup Time     Final   Value: 10/06/2013 03:56     Performed at [facility]   Culture     Final   Value: NO GROWTH 5 DAYS     Performed at [facility]   Report Status 10/12/2013 FINAL   Final  CULTURE, BLOOD (ROUTINE X 2)     Status: None   Collection Time    10/05/13 10:56 PM      Result Value Ref Range Status   Specimen Description BLOOD RIGHT FOREARM   Final   Special Requests BOTTLES DRAWN AEROBIC AND ANAEROBIC 10CC   Final   Culture  Setup Time     Final   Value: 10/06/2013 03:56     Performed at [facility]   Culture     Final   Value: NO GROWTH 5 DAYS     Performed at [facility]   Report Status 10/12/2013 FINAL   Final  CULTURE, BLOOD (ROUTINE X 2)     Status: None   Collection Time    10/14/13   3:45 PM      Result Value Ref Range Status   Specimen Description BLOOD RIGHT ARM   Final   Special Requests BOTTLES DRAWN AEROBIC ONLY 7 CC   Final   Culture  Setup Time  Final   Value: 10/14/2013 20:19     Performed at [facility]   Culture     Final   Value:        BLOOD CULTURE RECEIVED NO GROWTH TO DATE CULTURE WILL BE HELD FOR 5 DAYS BEFORE ISSUING A FINAL NEGATIVE REPORT     Performed at [facility]   Report Status PENDING   Incomplete  CULTURE, BLOOD (ROUTINE X 2)     Status: None   Collection Time    10/14/13  3:50 PM      Result Value Ref Range Status   Specimen Description BLOOD RIGHT HAND   Final   Special Requests BOTTLES DRAWN AEROBIC ONLY 5 CC   Final   Culture  Setup Time     Final   Value: 10/14/2013 20:19     Performed at [facility]   Culture     Final   Value:        BLOOD CULTURE RECEIVED NO GROWTH TO DATE CULTURE WILL BE HELD FOR 5 DAYS BEFORE ISSUING A FINAL NEGATIVE REPORT     Performed at [facility]   Report Status PENDING   Incomplete     Studies: Dg Abd Portable 1v  10/15/2013   CLINICAL DATA:  Nasogastric tube placement at bedside.  EXAM: PORTABLE ABDOMEN - 1 VIEW  COMPARISON:  10/04/2013.  FINDINGS: Nasogastric tube tip in the fundus of the stomach. Bowel gas pattern unremarkable.  IMPRESSION: Nasogastric tube tip in the fundus of the stomach. No acute abdominal abnormality.   Electronically Signed   By: Evangeline Dakin M.D.   On: 10/15/2013 03:41    Scheduled Meds: . bisacodyl  5 mg Oral BID  . darbepoetin (ARANESP) injection - DIALYSIS  25 mcg Intravenous Q Tue-HD  . feeding supplement (NEPRO CARB STEADY)  1,000 mL Per Tube Q24H  . heparin  5,000 Units Subcutaneous 3 times per day  . insulin aspart  0-20 Units Subcutaneous 6 times per day  . labetalol  300 mg Oral BID  . multivitamin  1 tablet Oral QHS  . neomycin-bacitracin-polymyxin   Topical TID  . ondansetron (ZOFRAN) IV  4 mg Intravenous 3 times per  day  . piperacillin-tazobactam (ZOSYN)  IV  2.25 g Intravenous 3 times per day  . sodium chloride  3 mL Intravenous Q12H  . vancomycin  1,000 mg Intravenous Q T,Th,Sa-HD   Continuous Infusions: . dextrose 5 % and 0.9% NaCl 20 mL (10/08/13 1818)    Active Problems:   HYPERTENSION   HEPATITIS C, HX OF   CEREBROVASCULAR ACCIDENT, HX OF   BENIGN PROSTATIC HYPERTROPHY   Hyperlipidemia   Anemia due to chronic illness   DM (diabetes mellitus) type I controlled with renal manifestation   Chronic combined systolic (EF 93%) and grade 2diastolic congestive heart failure   FTT (failure to thrive) in adult   Encephalopathy, metabolic   Dehydration   Fever    Time spent: > 35 minutes    Velvet Bathe  [facility] Pager [phone]. If 7PM-7AM, please contact night-coverage at [email or web address], password [facility] 10/15/2013, 2:11 PM  LOS: 14 days

## 2013-10-16 ENCOUNTER — Inpatient Hospital Stay (HOSPITAL_COMMUNITY): Payer: Medicare Other

## 2013-10-16 LAB — CBC WITH DIFFERENTIAL/PLATELET
Basophils Absolute: 0 10*3/uL (ref 0.0–0.1)
Basophils Relative: 0 % (ref 0–1)
EOS ABS: 0.1 10*3/uL (ref 0.0–0.7)
EOS PCT: 1 % (ref 0–5)
HEMATOCRIT: 28.6 % — AB (ref 39.0–52.0)
Hemoglobin: 9.3 g/dL — ABNORMAL LOW (ref 13.0–17.0)
LYMPHS ABS: 1.9 10*3/uL (ref 0.7–4.0)
Lymphocytes Relative: 21 % (ref 12–46)
MCH: 27.8 pg (ref 26.0–34.0)
MCHC: 32.5 g/dL (ref 30.0–36.0)
MCV: 85.6 fL (ref 78.0–100.0)
Monocytes Absolute: 1.2 10*3/uL — ABNORMAL HIGH (ref 0.1–1.0)
Monocytes Relative: 13 % — ABNORMAL HIGH (ref 3–12)
Neutro Abs: 5.9 10*3/uL (ref 1.7–7.7)
Neutrophils Relative %: 65 % (ref 43–77)
PLATELETS: 405 10*3/uL — AB (ref 150–400)
RBC: 3.34 MIL/uL — ABNORMAL LOW (ref 4.22–5.81)
RDW: 20.6 % — ABNORMAL HIGH (ref 11.5–15.5)
WBC: 9.2 10*3/uL (ref 4.0–10.5)

## 2013-10-16 LAB — GLUCOSE, CAPILLARY
GLUCOSE-CAPILLARY: 111 mg/dL — AB (ref 70–99)
GLUCOSE-CAPILLARY: 131 mg/dL — AB (ref 70–99)
GLUCOSE-CAPILLARY: 132 mg/dL — AB (ref 70–99)
GLUCOSE-CAPILLARY: 147 mg/dL — AB (ref 70–99)
Glucose-Capillary: 118 mg/dL — ABNORMAL HIGH (ref 70–99)
Glucose-Capillary: 128 mg/dL — ABNORMAL HIGH (ref 70–99)
Glucose-Capillary: 131 mg/dL — ABNORMAL HIGH (ref 70–99)
Glucose-Capillary: 138 mg/dL — ABNORMAL HIGH (ref 70–99)
Glucose-Capillary: 152 mg/dL — ABNORMAL HIGH (ref 70–99)

## 2013-10-16 LAB — COMPREHENSIVE METABOLIC PANEL
ALT: 11 U/L (ref 0–53)
AST: 21 U/L (ref 0–37)
Albumin: 2.4 g/dL — ABNORMAL LOW (ref 3.5–5.2)
Alkaline Phosphatase: 58 U/L (ref 39–117)
BILIRUBIN TOTAL: 0.5 mg/dL (ref 0.3–1.2)
BUN: 32 mg/dL — ABNORMAL HIGH (ref 6–23)
CHLORIDE: 90 meq/L — AB (ref 96–112)
CO2: 26 mEq/L (ref 19–32)
Calcium: 9.7 mg/dL (ref 8.4–10.5)
Creatinine, Ser: 5.61 mg/dL — ABNORMAL HIGH (ref 0.50–1.35)
GFR calc Af Amer: 11 mL/min — ABNORMAL LOW (ref >90)
GFR calc non Af Amer: 10 mL/min — ABNORMAL LOW (ref >90)
Glucose, Bld: 137 mg/dL — ABNORMAL HIGH (ref 70–99)
POTASSIUM: 4.3 meq/L (ref 3.7–5.3)
SODIUM: 136 meq/L — AB (ref 137–147)
Total Protein: 8.4 g/dL — ABNORMAL HIGH (ref 6.0–8.3)

## 2013-10-16 LAB — MAGNESIUM: Magnesium: 2.3 mg/dL (ref 1.5–2.5)

## 2013-10-16 MED ORDER — NEPRO/CARBSTEADY PO LIQD
1000.0000 mL | ORAL | Status: DC
  Administered 2013-10-16 – 2013-10-30 (×8): 1000 mL
  Filled 2013-10-16 (×20): qty 1000

## 2013-10-16 MED ORDER — LORAZEPAM 2 MG/ML IJ SOLN: 1.0000 mg | Freq: Once | INTRAMUSCULAR | Status: AC | PRN

## 2013-10-16 MED ORDER — PRO-STAT SUGAR FREE PO LIQD
30.0000 mL | Freq: Every day | ORAL | Status: DC
  Administered 2013-10-16 – 2013-10-31 (×13): 30 mL
  Filled 2013-10-16 (×16): qty 30

## 2013-10-16 MED ORDER — FLUOXETINE HCL 20 MG/5ML PO SOLN
20.0000 mg | Freq: Every day | ORAL | Status: DC
  Administered 2013-10-16 – 2013-10-31 (×15): 20 mg
  Filled 2013-10-16 (×17): qty 5

## 2013-10-16 MED ORDER — LORAZEPAM 2 MG/ML IJ SOLN
1.0000 mg | Freq: Once | INTRAMUSCULAR | Status: AC
  Administered 2013-10-16: 1 mg via INTRAVENOUS
  Filled 2013-10-16: qty 1

## 2013-10-16 NOTE — Procedures (Signed)
History: 63 yo M with altered mental status and left gaze deviation  Technique: This is a 17 channel routine scalp EEG performed at the bedside with bipolar and monopolar montages arranged in accordance to the international 10/20 system of electrode placement. One channel was dedicated to EKG recording.    Background: The background consists of irregular delta and theta activities. There delta activity occasionally appears more prominent on the left than the right hemisphere. The difference is most prominent over the temporal region.  Photic stimulation: Physiologic driving is performed  EEG Abnormalities: 1) generalized irregular delta activity with a left frontotemporal prominance  Clinical Interpretation: This EEG is consistent with a nonspecific generalized non-specific cerebral dysfunction(encephalopathy) dysfunction  With a suggestion of superimposed dysfunction of the left frontotemporal region.. There was no seizure or seizure predisposition recorded on this study.   Oscar Rack, MD [facility] [phone]  If 7pm- 7am, please page neurology on call as listed in [place].

## 2013-10-16 NOTE — Progress Notes (Signed)
Patient ID:Oscar Castillo      DOB: 04/24/1951      MRN:[id number]   Palliative Medicine Team at [place] Progress Note    Subjective:  Re met with wife who continues to assert full care and full code. Brodie remains vegitative . Will answer questions if asked. CT done to assess abdomen and pelvis because of white count.  Patient now on tube feeding.  Filed Vitals:   10/16/13 0407  BP: 115/74  Pulse: 95  Temp: 98.8 F (37.1 C)  Resp: 16   Physical exam:  General : no acute distress if undisturbed, left eye deviation persist, with torticollis to that side Pupils equal and able to medialize eyes with effort Chest decreased , no rhonchi or rales CVS: regular, S1, S2 Abd: soft not tender, had bm earlier Ext: left bka with scabs over stump Right bka wrapped Neuro: confused and vegetative   Assessment and plan: 63 yr old african american male with encepahlopathy vs delirium vs dementia vs depression vs all the above.  Patient remains with spiking temperatures without sources.  CT abd completed reading pending 1.  Full code. Spouse will not consider otherwise 2.  Spouse now requesting TF  3.  S/p revision of right bka. 4. Hep C no treatment at this time. 5. Depression: psych recommend adding liquid antidepressant like prozac. Spouse favors this.     Prognosis extremely poor.  Spouse not able to admit this and make choices accordingly. 5pm- 600 pm   L. , MD MBA The Palliative Medicine Team at [place] Team Phone: [phone] Pager: [phone]  

## 2013-10-16 NOTE — Progress Notes (Signed)
Subjective:  No voiced interaction/ opens eyes to voice/ hd for am /tolerating ng tube feedings Objective Vital signs in last 24 hours: Filed Vitals:   10/15/13 1216 10/15/13 1641 10/15/13 2023 10/16/13 0407  BP: 151/86 129/69 112/73 115/74  Pulse: 101 94 95 95  Temp: 100.4 F (38 C) 99.2 F (37.3 C) 98.5 F (36.9 C) 98.8 F (37.1 C)  TempSrc: Oral Oral Oral Oral  Resp: 19 18 16 16   Height:      Weight:   73 kg (160 lb 15 oz)   SpO2: 100% 97% 98% 98%  Physical Exam  General:awoken from sleep then only  starring, no distress Heart: RRR no mur, rub or gallop   Lungs:little resp effort with decreased  BS bilaterally Abdomen: soft nt, nd Extremities: right BKA wrapped; left few scabs no overt edema  Dialysis Access: left AVF + bruit   [facility]  4h 55min 72.5kg 2/2.0 [place] LUA AVF Heparin 5000 / 2400  Hectorol 1 Epo none Venofer none   Assessment/Plan:  1. AMS - no improvement; multifactorial= depression/ ho cva/ acute Illness/ 2. Fever - with no incr wbc  Yesterday  abdom CT no acute abnormality - has been on Vanc and zosyn  2. SP R BKA secondary to abscess/bone osteonecrosis 3/18.= on vanco /zosyn  3. ESRD - TTS - HD per routine; - k 4.3 this am  4. Nutrition  - NG placed sec to poor po intake, NPO -  5. PVD with bilateral BKAs s/p revision of right BKA 3/18  6. Hx CVA  7. Hep C + LFT ok  8. FTT/debiliatated/from SNF - palliative care has seen full code 9. Depression - seen by psych - rec liquid prozac  10. Anemia - Hgb 10 > 9.3 Aranesp 25 Q Tuesday  11. HTN/ volume UF 2.3 with hd SAT. And 73 kg wt yest./ this am 98% o2 sat and  weights likely not accurate and highly variable  Ernest Haber, PA-C [place] [phone] 10/16/2013,8:07 AM  LOS: 15 days   Pt seen, examined, agree w assess/plan as above with additions as indicated.  Kelly Splinter MD pager [phone]    cell [phone] 10/16/2013, 12:04 PM   Labs: Basic Metabolic  Panel:  Recent Labs Lab 10/10/13 0800  10/14/13 1659 10/15/13 0844 10/16/13 0540  NA 141  < > 143 138 136*  K 5.2  < > 4.8 4.7 4.3  CL 98  < > 100 91* 90*  CO2 25  < > 26 27 26   GLUCOSE 125*  < > 114* 109* 137*  BUN 42*  < > 33* 19 32*  CREATININE 6.61*  < > 7.01* 3.92* 5.61*  CALCIUM 9.3  < > 9.3 9.5 9.7  PHOS 5.7*  --  6.5*  --   --   < > = values in this interval not displayed. Liver Function Tests:  Recent Labs Lab 10/13/13 0500 10/14/13 1659 10/15/13 0844 10/16/13 0540  AST 26  --  31 21  ALT 13  --  15 11  ALKPHOS 59  --  66 58  BILITOT 0.5  --  0.6 0.5  PROT 7.8  --  9.1* 8.4*  ALBUMIN 2.3* 2.3* 2.6* 2.4*    Recent Labs Lab 10/12/13 0500 10/13/13 0500 10/14/13 1659 10/15/13 0844 10/16/13 0540  WBC 7.6 9.8 8.8 9.0 9.2  NEUTROABS 5.4 6.1  --  6.2 5.9  HGB 9.2* 9.1* 8.6* 10.0* 9.3*  HCT 28.2* 28.5* 26.4*  30.8* 28.6*  MCV 83.9 85.8 85.2 85.8 85.6  PLT 411* 430* 461* 455* 405*  CBG:  Recent Labs Lab 10/15/13 2014 10/15/13 2211 10/15/13 2356 10/16/13 0402 10/16/13 0744  GLUCAP 128* 118* 111* 131* 147*    Studies/Results: Ct Abdomen Pelvis W Contrast  10/15/2013   CLINICAL DATA:  Fever of unknown origin. End-stage renal disease on hemodialysis.  EXAM: CT ABDOMEN AND PELVIS WITH CONTRAST  TECHNIQUE: Multidetector CT imaging of the abdomen and pelvis was performed using the standard protocol following bolus administration of intravenous contrast.  CONTRAST:  152mL OMNIPAQUE IOHEXOL 300 MG/ML  SOLN  COMPARISON:  11/06/2012  FINDINGS: Nasogastric tube is seen within the stomach. The liver, gallbladder, pancreas, spleen, and adrenal glands are normal in appearance. Bilateral renal parenchymal atrophy is seen. No evidence of renal masses or hydronephrosis. No masses or lymphadenopathy seen within the abdomen or pelvis.  Normal appendix is visualized. No evidence of inflammatory process or abnormal fluid collections. No evidence of bowel wall thickening or  dilatation.  IMPRESSION: No acute findings.  Bilateral renal parenchymal atrophy, consistent with end-stage renal disease. No evidence of hydronephrosis.   Electronically Signed   By: Earle Gell M.D.   On: 10/15/2013 19:50   Dg Abd Portable 1v  10/15/2013   CLINICAL DATA:  Nasogastric tube placement at bedside.  EXAM: PORTABLE ABDOMEN - 1 VIEW  COMPARISON:  10/04/2013.  FINDINGS: Nasogastric tube tip in the fundus of the stomach. Bowel gas pattern unremarkable.  IMPRESSION: Nasogastric tube tip in the fundus of the stomach. No acute abdominal abnormality.   Electronically Signed   By: Evangeline Dakin M.D.   On: 10/15/2013 03:41   Medications: . dextrose 5 % and 0.9% NaCl 10 mL/hr at 10/16/13 0009   . bisacodyl  5 mg Oral BID  . darbepoetin (ARANESP) injection - DIALYSIS  25 mcg Intravenous Q Tue-HD  . feeding supplement (NEPRO CARB STEADY)  1,000 mL Per Tube Q24H  . heparin  5,000 Units Subcutaneous 3 times per day  . insulin aspart  0-20 Units Subcutaneous 6 times per day  . labetalol  300 mg Oral BID  . multivitamin  1 tablet Oral QHS  . neomycin-bacitracin-polymyxin   Topical TID  . ondansetron (ZOFRAN) IV  4 mg Intravenous 3 times per day  . piperacillin-tazobactam (ZOSYN)  IV  2.25 g Intravenous 3 times per day  . sodium chloride  3 mL Intravenous Q12H  . vancomycin  1,000 mg Intravenous Q T,Th,Sa-HD

## 2013-10-16 NOTE — Progress Notes (Signed)
Utilization review completed.  

## 2013-10-16 NOTE — Progress Notes (Signed)
NUTRITION FOLLOW UP  Intervention:   Increase Nepro to new goal rate of 45 ml/hr and provide 30 ml Prostat once daily via tube.  Tube feeding regimen to provide 2044 kcals (100% of re-estimated kcal needs), 102 grams of protein (100% of re-estimated kcal needs), and 785 ml of free water.  Nutrition Dx:   Increased nutrient needs related to wound healing, ESRD as evidenced by estimated nutrition needs, Ongoing  Goal:   Pt to meet >/= 90% of their estimated nutrition needs, currently unmet  Monitor:   TF rate and tolerance, weight trends, labs, I/O's  Assessment:   63 y.o. 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  3/17-BSE completed by SLP on 3/10 with recommendations for Dysphagia 1 diet with thin liquids. Suspect continued inadequate intake 2/2 cognition. Team met with patient's wife, sister and brother-in-law who were in agreement to meeting with palliative care team. Meeting on 3/14 reveal wife desires pt to continue as Full Code until mental status improves.  RD saw patient during HD treatment. Pt able to answer simple yes and no questions. Continues on Dysphagia 1 diet with Nepro Shakes ordered BID. Pt states that he enjoys these supplements. Meal intake variable since admission, but overall <50%.  EDW per renal is 72.5 kg. Pt has required IVF this admit 2/2 poor po intake.  Potassium WNL, phos elevated at 5.7.  3/23- Pt with ongoing poor PO intake-Spouse requested tube feeding-NG placed. Pt is currently NPO. Pt with Hep C. Nutritional management was consulted for tube feeding adjustment and management.  Tube feeding was initiated yesterday. Current tube feeding, Nepro, is running at 30 ml/hr to provide 1296 kcals (68% of re-estimated minimum kcals needs), 58 grams of protein (58% of re-estimated minimum protein needs), and 523 ml of free  water.  Per RN, pt is tolerating tube feeding with little to no residuals and no known plans for diet advancement.  Sodium and potassium are WNL (3/23), phosphorous is elevated at 6.5 (3/21)  Height: Ht Readings from Last 1 Encounters:  10/04/13 _0  (1.422 m)    Weight Status:   Wt Readings from Last 1 Encounters:  10/15/13 160 lb 15 oz (73 kg)  10/14/13 174 lb 10/13/13 186 lb 10/12/13 182 lb 10/11/13 182 lb 10/10/13 194 lb Admit wt(10/01/13)159 lb Weight trending back down to admit weight.  Re-estimated needs:  Kcal: 1900-2100  Protein: 100-110 gm  Fluid: 1200 ml  BMI: 26.4 kg/m2 -- adjusted for bilateral BKA's  Skin: Stage II pressure ulcer on sacrum, lower leg, and incision on right leg  Diet Order: NPO   Intake/Output Summary (Last 24 hours) at 10/16/13 1018 Last data filed at 10/16/13 0800  Gross per 24 hour  Intake 318.83 ml  Output      0 ml  Net 318.83 ml    Last BM: 3/22   Labs:   Recent Labs Lab 10/10/13 0800  10/14/13 1659 10/15/13 0844 10/16/13 0540  NA 141  < > 143 138 136*  K 5.2  < > 4.8 4.7 4.3  CL 98  < > 100 91* 90*  CO2 25  < > _1 BUN 42*  < > 33* 19 32*  CREATININE 6.61*  < > 7.01* 3.92* 5.61*  CALCIUM 9.3  < > 9.3 9.5 9.7  MG 2.2  < > 2.4 2.2 2.3  PHOS 5.7*  --  6.5*  --   --   GLUCOSE 125*  < > 114* 109* 137*  < > = values in this interval not displayed.  CBG (last 3)   Recent Labs  10/15/13 2356 10/16/13 0402 10/16/13 0744  GLUCAP 111* 131* 147*    Scheduled Meds: . bisacodyl  5 mg Oral BID  . darbepoetin (ARANESP) injection - DIALYSIS  25 mcg Intravenous Q Tue-HD  . feeding supplement (NEPRO CARB STEADY)  1,000 mL Per Tube Q24H  . heparin  5,000 Units Subcutaneous 3 times per day  . insulin aspart  0-20 Units Subcutaneous 6 times per day  . labetalol  300 mg Oral BID  . multivitamin  1 tablet Oral QHS  . neomycin-bacitracin-polymyxin   Topical TID  . ondansetron (ZOFRAN) IV  4 mg Intravenous 3 times  per day  . piperacillin-tazobactam (ZOSYN)  IV  2.25 g Intravenous 3 times per day  . sodium chloride  3 mL Intravenous Q12H  . vancomycin  1,000 mg Intravenous Q T,Th,Sa-HD    Continuous Infusions: . dextrose 5 % and 0.9% NaCl 10 mL/hr at 10/16/13 0009    [facility] Dietetic Intern Pager: [phone]  I agree with the above information and made appropriate revisions. Inda Coke MS, RD, LDN Inpatient Registered Dietitian Pager: [phone] After-hours pager: [phone]

## 2013-10-16 NOTE — Progress Notes (Signed)
Unable to complete MRI at this time. Per MRI, patient will need to complete MRI tomorrow. Pt restless and anxious. Kirkpatrick MD stated if MRI is unable to be completed tonight, complete a head CT instead and to complete MRI tomorrow. Nicole Kindred MD notified of patient status. New orders placed for head CT without without contrast. [name]

## 2013-10-16 NOTE — Consult Note (Signed)
NEURO HOSPITALIST CONSULT NOTE    Reason for Consult: AMS  HPI:                                                                                                                                          Oscar Castillo is an 63 y.o. male with known ESRF November 2013 on HD. Patient has DM with gangrene left toe status post amputation 04/2013, left leg BKA 05/2013, right leg BKA 08/2013 who had some AMS per note on arrival prior to amputation. Per notes patient was doing well until his post followup appointment with Dr. Sharol Given of orthopedics. At that time he seems to have decreased appetite and change in mental state. . On 09/24/13 he was noted to be increasingly confused and not wanting to take part in therapy. Over a period of 6 days he was noted to have FFT, would not take medications.  Patient was brought to hospital on 3/8 for encephalopathy. While hospitalized he has been afebrile and showed fluctuating BP with a few being systolic in the 29'N. Blood cultures have shown no growth, Ammonia 29, CSF (protient 35, glucose 156, RBC 7, WBC 1, clear, no lymphs, gram stain and culture negative), TSH/RPR/ B12/ folate normal. Neuro was consulted for further evaluation of AMS as patient remains non responsive since the 20th.    Past Medical History  Diagnosis Date  . ESRD on hemodialysis 05/05/2007    ESRD due to DM/HTN. Started dialysis in November 2013.  HD TTS at [facility] on [address].  [name] BACK PAIN, LUMBAR, CHRONIC 08/06/2009  . BENIGN PROSTATIC HYPERTROPHY 08/01/2010  . CEREBROVASCULAR ACCIDENT, HX OF 08/06/2009  . CHOLELITHIASIS 08/01/2010  . CONGESTIVE HEART FAILURE 03/18/2009  . DEPRESSION 03/18/2009  . DIABETES MELLITUS, TYPE II 03/25/2007  . ERECTILE DYSFUNCTION 03/25/2007  . GERD 03/25/2007  . HEPATITIS C, HX OF 03/25/2007  . HYPERTENSION 03/25/2007  . Morbid obesity 03/25/2007  . NEPHROLITHIASIS, HX OF 03/18/2009  . Complication of anesthesia     wife states pt had trouble waking up  with his last surgery in Nov., 2014    Past Surgical History  Procedure Laterality Date  . Nephrectomy      partial RR  . Av fistula placement  06/14/2012    Procedure: ARTERIOVENOUS (AV) FISTULA CREATION;  Surgeon: Angelia Mould, MD;  Location: [facility] OR;  Service: Vascular;  Laterality: Left;  Left basilic vein transposition with fistula.  . Tibia im nail insertion Left 09/09/2012    Procedure: INTRAMEDULLARY (IM) NAIL TIBIAL;  Surgeon: Johnny Bridge, MD;  Location: [place];  Service: Orthopedics;  Laterality: Left;  left tibial nail and open reduction internal fixation left fibula fracture  . Orif fibula fracture Left 09/09/2012    Procedure: OPEN REDUCTION  INTERNAL FIXATION (ORIF) FIBULA FRACTURE;  Surgeon: Johnny Bridge, MD;  Location: [place];  Service: Orthopedics;  Laterality: Left;  . Colonoscopy N/A 10/28/2012    Procedure: COLONOSCOPY;  Surgeon: Jeryl Columbia, MD;  Location: [facility] ENDOSCOPY;  Service: Endoscopy;  Laterality: N/A;  . Esophagogastroduodenoscopy N/A 11/02/2012    Procedure: ESOPHAGOGASTRODUODENOSCOPY (EGD);  Surgeon: Cleotis Nipper, MD;  Location: [facility] ENDOSCOPY;  Service: Endoscopy;  Laterality: N/A;  . Colonoscopy N/A 11/02/2012    Procedure: COLONOSCOPY;  Surgeon: Cleotis Nipper, MD;  Location: [facility] ENDOSCOPY;  Service: Endoscopy;  Laterality: N/A;  . Colonoscopy N/A 11/03/2012    Procedure: COLONOSCOPY;  Surgeon: Cleotis Nipper, MD;  Location: [facility] ENDOSCOPY;  Service: Endoscopy;  Laterality: N/A;  . Givens capsule study N/A 11/04/2012    Procedure: GIVENS CAPSULE STUDY;  Surgeon: Cleotis Nipper, MD;  Location: [facility] ENDOSCOPY;  Service: Endoscopy;  Laterality: N/A;  . Enteroscopy N/A 11/08/2012    Procedure: ENTEROSCOPY;  Surgeon: Wonda Horner, MD;  Location: [facility] ENDOSCOPY;  Service: Endoscopy;  Laterality: N/A;  . Amputation Left 05/12/2013    Procedure: AMPUTATION RAY;  Surgeon: Newt Minion, MD;  Location: [place];  Service: Orthopedics;  Laterality: Left;  Left Foot 1st  Ray Amputation  . Eye surgery Left     to remove scar tissue  . Amputation Left 06/09/2013    Procedure: AMPUTATION BELOW KNEE;  Surgeon: Newt Minion, MD;  Location: [place];  Service: Orthopedics;  Laterality: Left;  Left Below Knee Amputation and removal proximal screws IM tibial nail  . Hardware removal Left 06/09/2013    Procedure: HARDWARE REMOVAL;  Surgeon: Newt Minion, MD;  Location: [place];  Service: Orthopedics;  Laterality: Left;  Left Below Knee Amputation  and Removal proximal screws IM tibial nail  . Amputation Right 09/08/2013    Procedure: AMPUTATION BELOW KNEE;  Surgeon: Newt Minion, MD;  Location: [place];  Service: Orthopedics;  Laterality: Right;  Right Below Knee Amputation  . Amputation Right 10/11/2013    Procedure: AMPUTATION BELOW KNEE;  Surgeon: Newt Minion, MD;  Location: [place];  Service: Orthopedics;  Laterality: Right;  Right Below Knee Amputation Revision    Family History  Problem Relation Age of Onset  . Coronary artery disease Other   . Diabetes Other      Social History:  reports that he has never smoked. He has never used smokeless tobacco. He reports that he does not drink alcohol or use illicit drugs.  No Known Allergies  MEDICATIONS:                                                                                                                     Scheduled: . bisacodyl  5 mg Oral BID  . darbepoetin (ARANESP) injection - DIALYSIS  25 mcg Intravenous Q Tue-HD  . feeding supplement (PRO-STAT SUGAR FREE 64)  30 mL Per Tube Q1200  . FLUoxetine  20 mg Per Tube Daily  . heparin  5,000 Units Subcutaneous 3 times per day  . insulin aspart  0-20 Units Subcutaneous 6 times per day  . labetalol  300 mg Oral BID  . multivitamin  1 tablet Oral QHS  . neomycin-bacitracin-polymyxin   Topical TID  . ondansetron (ZOFRAN) IV  4 mg Intravenous 3 times per day  . piperacillin-tazobactam (ZOSYN)  IV  2.25 g Intravenous 3 times per day  . sodium chloride  3 mL  Intravenous Q12H  . vancomycin  1,000 mg Intravenous Q T,Th,Sa-HD     ROS:                                                                                                                                       History obtained from unobtainable from patient due to mental status   Blood pressure 130/61, pulse 90, temperature 98.6 F (37 C), temperature source Oral, resp. rate 16, height 4\' 8"  (1.422 m), weight 73 kg (160 lb 15 oz), SpO2 99.00%.   Neurologic Examination:                                                                                                      Mental Status: Alert, opens eyes when name is called, when asked questions he repeats "yes", shows a left gaze preference. Attempts to follow some verbal commands, but not well.  Cranial Nerves: II: Discs flat bilaterally; Visual fields winced to light on the right and showed no blink threat, pupils equal, round, reactive to light and accommodation III,IV, VI: ptosis not present, extra-ocular motions--left gaze preference, did not cross midline to the right with Doll's V,VII: face symmetric when winces to pain, facial light touch assess but winced to bilateral supraorbital pressure VIII: hearing opens eyes to voice IX,X: cough reflex present XI: unable to assess XII: midline tongue extension   Motor: Showed no movement in left arm even to pain, localized to pain with right arm when noxious stim given to right leg, held both lags antigravity with spontaneous movement.  Sensory: winces to pain bilateral finger noxious stimuli and withdrew legs with noxious stimuli.  Deep Tendon Reflexes:  Right: Upper Extremity   Left: Upper extremity   biceps (C-5 to C-6) 2/4   biceps (C-5 to C-6) 2/4 tricep (C7) 2/4    triceps (C7) 2/4 Brachioradialis (C6) 2/4  Brachioradialis (C6) 2/4  Lower Extremity Lower Extremity  quadriceps (L-2 to L-4) 0/4   quadriceps (L-2 to L-4) 0/4 Bilateral BKA  Plantars: Bilateral  BKA Cerebellar:  Unable to assess Gait: unable to assess CV: pulses palpable throughout    Lab Results: Basic Metabolic Panel:  Recent Labs Lab 10/10/13 0800  10/12/13 0500 10/13/13 0500 10/14/13 1659 10/15/13 0844 10/16/13 0540  NA 141  < > 138 140 143 138 136*  K 5.2  < > 5.1 4.2 4.8 4.7 4.3  CL 98  < > 94* 98 100 91* 90*  CO2 25  < > 24 25 26 27 26   GLUCOSE 125*  < > 126* 128* 114* 109* 137*  BUN 42*  < > 31* 16 33* 19 32*  CREATININE 6.61*  < > 5.73* 3.89* 7.01* 3.92* 5.61*  CALCIUM 9.3  < > 9.4 9.6 9.3 9.5 9.7  MG 2.2  < > 2.0 2.1 2.4 2.2 2.3  PHOS 5.7*  --   --   --  6.5*  --   --   < > = values in this interval not displayed.  Liver Function Tests:  Recent Labs Lab 10/11/13 0528 10/12/13 0500 10/13/13 0500 10/14/13 1659 10/15/13 0844 10/16/13 0540  AST 22 23 26   --  31 21  ALT 14 14 13   --  15 11  ALKPHOS 58 62 59  --  66 58  BILITOT 0.3 0.5 0.5  --  0.6 0.5  PROT 7.6 7.5 7.8  --  9.1* 8.4*  ALBUMIN 2.3* 2.4* 2.3* 2.3* 2.6* 2.4*   No results found for this basename: LIPASE, AMYLASE,  in the last 168 hours No results found for this basename: AMMONIA,  in the last 168 hours  CBC:  Recent Labs Lab 10/11/13 0528 10/12/13 0500 10/13/13 0500 10/14/13 1659 10/15/13 0844 10/16/13 0540  WBC 6.0 7.6 9.8 8.8 9.0 9.2  NEUTROABS 3.4 5.4 6.1  --  6.2 5.9  HGB 10.7* 9.2* 9.1* 8.6* 10.0* 9.3*  HCT 32.9* 28.2* 28.5* 26.4* 30.8* 28.6*  MCV 84.6 83.9 85.8 85.2 85.8 85.6  PLT 371 411* 430* 461* 455* 405*    Cardiac Enzymes: No results found for this basename: CKTOTAL, CKMB, CKMBINDEX, TROPONINI,  in the last 168 hours  Lipid Panel: No results found for this basename: CHOL, TRIG, HDL, CHOLHDL, VLDL, LDLCALC,  in the last 168 hours  CBG:  Recent Labs Lab 10/15/13 2211 10/15/13 2356 10/16/13 0402 10/16/13 0744 10/16/13 1114  GLUCAP 118* 111* 131* 147* 132*    Microbiology: Results for orders placed during the hospital encounter of 10/01/13   CULTURE, BLOOD (ROUTINE X 2)     Status: None   Collection Time    10/01/13 11:35 AM      Result Value Ref Range Status   Specimen Description BLOOD RIGHT WRIST   Final   Special Requests BOTTLES DRAWN AEROBIC ONLY 10CC   Final   Culture  Setup Time     Final   Value: 10/01/2013 19:00     Performed at [facility]   Culture     Final   Value: NO GROWTH 5 DAYS     Performed at [facility]   Report Status 10/07/2013 FINAL   Final  CULTURE, BLOOD (ROUTINE X 2)     Status: None   Collection Time    10/01/13 12:01 PM      Result Value Ref Range Status   Specimen Description BLOOD RIGHT FOREARM   Final   Special Requests BOTTLES DRAWN AEROBIC AND ANAEROBIC 5CC   Final   Culture  Setup Time     Final   Value:  10/01/2013 18:59     Performed at [facility]   Culture     Final   Value: NO GROWTH 5 DAYS     Performed at [facility]   Report Status 10/07/2013 FINAL   Final  CSF CULTURE     Status: None   Collection Time    10/02/13 10:55 AM      Result Value Ref Range Status   Specimen Description CSF   Final   Special Requests Normal   Final   Gram Stain     Final   Value: FEW WBC PRESENT, PREDOMINANTLY MONONUCLEAR     NO ORGANISMS SEEN     Performed at [facility]   Culture     Final   Value: NO GROWTH 3 DAYS     Performed at [facility]   Report Status 10/06/2013 FINAL   Final  GRAM STAIN     Status: None   Collection Time    10/02/13 10:55 AM      Result Value Ref Range Status   Specimen Description CSF   Final   Special Requests Normal   Final   Gram Stain     Final   Value: CYTOSPIN PREP     WBC PRESENT, PREDOMINANTLY MONONUCLEAR     NO ORGANISMS SEEN   Report Status 10/02/2013 FINAL   Final  MRSA PCR SCREENING     Status: None   Collection Time    10/02/13 11:18 PM      Result Value Ref Range Status   MRSA by PCR NEGATIVE  NEGATIVE Final   Comment:            The GeneXpert MRSA Assay (FDA     approved for  NASAL specimens     only), is one component of a     comprehensive MRSA colonization     surveillance program. It is not     intended to diagnose MRSA     infection nor to guide or     monitor treatment for     MRSA infections.  CLOSTRIDIUM DIFFICILE BY PCR     Status: None   Collection Time    10/05/13  2:05 AM      Result Value Ref Range Status   C difficile by pcr NEGATIVE  NEGATIVE Final  CULTURE, BLOOD (ROUTINE X 2)     Status: None   Collection Time    10/05/13 10:42 PM      Result Value Ref Range Status   Specimen Description BLOOD RIGHT UPPER ARM   Final   Special Requests BOTTLES DRAWN AEROBIC AND ANAEROBIC 10CC   Final   Culture  Setup Time     Final   Value: 10/06/2013 03:56     Performed at [facility]   Culture     Final   Value: NO GROWTH 5 DAYS     Performed at [facility]   Report Status 10/12/2013 FINAL   Final  CULTURE, BLOOD (ROUTINE X 2)     Status: None   Collection Time    10/05/13 10:56 PM      Result Value Ref Range Status   Specimen Description BLOOD RIGHT FOREARM   Final   Special Requests BOTTLES DRAWN AEROBIC AND ANAEROBIC 10CC   Final   Culture  Setup Time     Final   Value: 10/06/2013 03:56     Performed at [facility]  Final   Value: NO GROWTH 5 DAYS     Performed at [facility]   Report Status 10/12/2013 FINAL   Final  CULTURE, BLOOD (ROUTINE X 2)     Status: None   Collection Time    10/14/13  3:45 PM      Result Value Ref Range Status   Specimen Description BLOOD RIGHT ARM   Final   Special Requests BOTTLES DRAWN AEROBIC ONLY 7 CC   Final   Culture  Setup Time     Final   Value: 10/14/2013 20:19     Performed at [facility]   Culture     Final   Value:        BLOOD CULTURE RECEIVED NO GROWTH TO DATE CULTURE WILL BE HELD FOR 5 DAYS BEFORE ISSUING A FINAL NEGATIVE REPORT     Performed at [facility]   Report Status PENDING   Incomplete  CULTURE, BLOOD (ROUTINE X 2)      Status: None   Collection Time    10/14/13  3:50 PM      Result Value Ref Range Status   Specimen Description BLOOD RIGHT HAND   Final   Special Requests BOTTLES DRAWN AEROBIC ONLY 5 CC   Final   Culture  Setup Time     Final   Value: 10/14/2013 20:19     Performed at [facility]   Culture     Final   Value:        BLOOD CULTURE RECEIVED NO GROWTH TO DATE CULTURE WILL BE HELD FOR 5 DAYS BEFORE ISSUING A FINAL NEGATIVE REPORT     Performed at [facility]   Report Status PENDING   Incomplete    Coagulation Studies: No results found for this basename: LABPROT, INR,  in the last 72 hours  Imaging: Ct Abdomen Pelvis W Contrast  10/15/2013   CLINICAL DATA:  Fever of unknown origin. End-stage renal disease on hemodialysis.  EXAM: CT ABDOMEN AND PELVIS WITH CONTRAST  TECHNIQUE: Multidetector CT imaging of the abdomen and pelvis was performed using the standard protocol following bolus administration of intravenous contrast.  CONTRAST:  140mL OMNIPAQUE IOHEXOL 300 MG/ML  SOLN  COMPARISON:  11/06/2012  FINDINGS: Nasogastric tube is seen within the stomach. The liver, gallbladder, pancreas, spleen, and adrenal glands are normal in appearance. Bilateral renal parenchymal atrophy is seen. No evidence of renal masses or hydronephrosis. No masses or lymphadenopathy seen within the abdomen or pelvis.  Normal appendix is visualized. No evidence of inflammatory process or abnormal fluid collections. No evidence of bowel wall thickening or dilatation.  IMPRESSION: No acute findings.  Bilateral renal parenchymal atrophy, consistent with end-stage renal disease. No evidence of hydronephrosis.   Electronically Signed   By: Earle Gell M.D.   On: 10/15/2013 19:50   Dg Abd Portable 1v  10/15/2013   CLINICAL DATA:  Nasogastric tube placement at bedside.  EXAM: PORTABLE ABDOMEN - 1 VIEW  COMPARISON:  10/04/2013.  FINDINGS: Nasogastric tube tip in the fundus of the stomach. Bowel gas pattern  unremarkable.  IMPRESSION: Nasogastric tube tip in the fundus of the stomach. No acute abdominal abnormality.   Electronically Signed   By: Evangeline Dakin M.D.   On: 10/15/2013 03:41    Assessment and plan per attending neurologist  Etta Quill PA-C [facility] [phone]  10/16/2013, 12:25 PM   Assessment/Plan: 63 YO male with confusion prior to hospitalization which seemed to rapidly deteriorate over period  of 6 days.  Work up this far has been negative. He has aphasia with left gaze deviation concerning for stroke. EEG is negative for seizure. Will need to confirm stroke and exclude hemorrhage with MRI.   1) MRI brain w/o contrast  2) further recommendations pending the above study.    Roland Rack, MD [facility] [phone]  If 7pm- 7am, please page neurology on call as listed in [place].

## 2013-10-16 NOTE — Progress Notes (Signed)
[facility] PROGRESS NOTE  SHANNON KIRKENDALL XTG:[id number] DOB: [birth year] DOA: 10/01/2013 PCP: [name], MD  Assessment/Plan:  Encephalopathy, metabolic  -At this juncture etiology uncertain.  Thought to be secondary to infection but patient has had abscess drainage/revision of right BKA stump.  Still no improvement. RPR and HIV Non reactive, B12 and folate levels within normal limits - consulted Psychiatry who currently recommends liquid antidepressant and has diagnosed patient with depressive disorder 2ary to general medical condition - chest x ray obtained and reported as no active disease - CT of abd and pelvis negative for source of infection. Will obtain MRI of right BKA stump to see if there are still any more signs of infection. - Consulted neurology for further evaluation and recommendations regarding current altered mental status  New LBBB  -has been transient in nature  -systolic function normal but septal motion with abnormal function and dyssynergy  -cardiac enzymes negative    Dehydration/FTT (failure to thrive) in adult  - Speech and Land (SLP) attempted 10/02/2013 the patient unable to arouse enough to eval but later re examined and now on D1 diet  -Will start on tube feeds  HYPERTENSION  -Continue labetalol 300 mg BID  -Continue hydralazine 5 mg PRN SBP> 160 or DBP> 100   Anemia due to chronic illness  -Will continue to monitor.  DM (diabetes mellitus) type I controlled with renal manifestation  -On Lantus prior to admission  -Continue resistant SSI; CBG currently controlled on this regimen   CKD (chronic kidney disease) stage V requiring chronic dialysis  -Usual dialysis days Tuesday Thursday Saturday   Chronic combined systolic (EF 27%) and grade 2 diastolic congestive heart failure  -Compensated and managed with dialysis on T/Th/Sat   HEPATITIS C, HX OF  CEREBROVASCULAR ACCIDENT, HX OF  -No evidence of acute infarct   Hyperlipidemia   -stable  Right BKA  -Pain is increasing since surgery in February 2015. MRI 3/13 shows pathology see results below  -3/14 MRI of right BKA stump shows abscess/osteomyelitis  s/p  abscess drainage/revision of right BKA stump on 3/18  Osteomyelitis right BKA stump  - Please see above.  - d/c IV antibiotics given recent abscess drainage/revision of R BKA  Code Status: Full  Family Communication: Discussed case with wife Disposition Plan: Awaiting improvement in medical condition, Wife would not like feeding tube placement but is ok with NG tube at this moment.  Consultants:  Nephrology   Palliative  Orthopaedic  Procedures:  As listed above  Antibiotics:  Vanc and zosyn  HPI/Subjective: No new problems reported overnight.  Objective: Filed Vitals:   10/16/13 0800  BP: 130/61  Pulse: 90  Temp: 98.6 F (37 C)  Resp: 16    Intake/Output Summary (Last 24 hours) at 10/16/13 1607 Last data filed at 10/16/13 0800  Gross per 24 hour  Intake 318.83 ml  Output      0 ml  Net 318.83 ml   Filed Weights   10/14/13 1645 10/14/13 2125 10/15/13 2023  Weight: 82 kg (180 lb 12.4 oz) 79 kg (174 lb 2.6 oz) 73 kg (160 lb 15 oz)    Exam:   General:  Pt in NAD, Alert and awake  Cardiovascular: RRR, no mrg  Respiratory: CTA BL, no wheezes, no increased wob  Abdomen: soft, ND, NT  Musculoskeletal: no cyanosis at upper extremities.   Data Reviewed: Basic Metabolic Panel:  Recent Labs Lab 10/10/13 0800  10/12/13 0500 10/13/13 0500 10/14/13 1659 10/15/13 0844 10/16/13  0540  NA 141  < > 138 140 143 138 136*  K 5.2  < > 5.1 4.2 4.8 4.7 4.3  CL 98  < > 94* 98 100 91* 90*  CO2 25  < > 24 25 26 27 26   GLUCOSE 125*  < > 126* 128* 114* 109* 137*  BUN 42*  < > 31* 16 33* 19 32*  CREATININE 6.61*  < > 5.73* 3.89* 7.01* 3.92* 5.61*  CALCIUM 9.3  < > 9.4 9.6 9.3 9.5 9.7  MG 2.2  < > 2.0 2.1 2.4 2.2 2.3  PHOS 5.7*  --   --   --  6.5*  --   --   < > = values in this  interval not displayed. Liver Function Tests:  Recent Labs Lab 10/11/13 0528 10/12/13 0500 10/13/13 0500 10/14/13 1659 10/15/13 0844 10/16/13 0540  AST 22 23 26   --  31 21  ALT 14 14 13   --  15 11  ALKPHOS 58 62 59  --  66 58  BILITOT 0.3 0.5 0.5  --  0.6 0.5  PROT 7.6 7.5 7.8  --  9.1* 8.4*  ALBUMIN 2.3* 2.4* 2.3* 2.3* 2.6* 2.4*   No results found for this basename: LIPASE, AMYLASE,  in the last 168 hours No results found for this basename: AMMONIA,  in the last 168 hours CBC:  Recent Labs Lab 10/11/13 0528 10/12/13 0500 10/13/13 0500 10/14/13 1659 10/15/13 0844 10/16/13 0540  WBC 6.0 7.6 9.8 8.8 9.0 9.2  NEUTROABS 3.4 5.4 6.1  --  6.2 5.9  HGB 10.7* 9.2* 9.1* 8.6* 10.0* 9.3*  HCT 32.9* 28.2* 28.5* 26.4* 30.8* 28.6*  MCV 84.6 83.9 85.8 85.2 85.8 85.6  PLT 371 411* 430* 461* 455* 405*   Cardiac Enzymes: No results found for this basename: CKTOTAL, CKMB, CKMBINDEX, TROPONINI,  in the last 168 hours BNP (last 3 results)  Recent Labs  10/01/13 1135  PROBNP 1397.0*   CBG:  Recent Labs Lab 10/15/13 2211 10/15/13 2356 10/16/13 0402 10/16/13 0744 10/16/13 1114  GLUCAP 118* 111* 131* 147* 132*    Recent Results (from the past 240 hour(s))  CULTURE, BLOOD (ROUTINE X 2)     Status: None   Collection Time    10/14/13  3:45 PM      Result Value Ref Range Status   Specimen Description BLOOD RIGHT ARM   Final   Special Requests BOTTLES DRAWN AEROBIC ONLY 7 CC   Final   Culture  Setup Time     Final   Value: 10/14/2013 20:19     Performed at [facility]   Culture     Final   Value:        BLOOD CULTURE RECEIVED NO GROWTH TO DATE CULTURE WILL BE HELD FOR 5 DAYS BEFORE ISSUING A FINAL NEGATIVE REPORT     Performed at [facility]   Report Status PENDING   Incomplete  CULTURE, BLOOD (ROUTINE X 2)     Status: None   Collection Time    10/14/13  3:50 PM      Result Value Ref Range Status   Specimen Description BLOOD RIGHT HAND   Final    Special Requests BOTTLES DRAWN AEROBIC ONLY 5 CC   Final   Culture  Setup Time     Final   Value: 10/14/2013 20:19     Performed at [facility]   Culture     Final   Value:  BLOOD CULTURE RECEIVED NO GROWTH TO DATE CULTURE WILL BE HELD FOR 5 DAYS BEFORE ISSUING A FINAL NEGATIVE REPORT     Performed at [facility]   Report Status PENDING   Incomplete     Studies: Ct Abdomen Pelvis W Contrast  10/15/2013   CLINICAL DATA:  Fever of unknown origin. End-stage renal disease on hemodialysis.  EXAM: CT ABDOMEN AND PELVIS WITH CONTRAST  TECHNIQUE: Multidetector CT imaging of the abdomen and pelvis was performed using the standard protocol following bolus administration of intravenous contrast.  CONTRAST:  163mL OMNIPAQUE IOHEXOL 300 MG/ML  SOLN  COMPARISON:  11/06/2012  FINDINGS: Nasogastric tube is seen within the stomach. The liver, gallbladder, pancreas, spleen, and adrenal glands are normal in appearance. Bilateral renal parenchymal atrophy is seen. No evidence of renal masses or hydronephrosis. No masses or lymphadenopathy seen within the abdomen or pelvis.  Normal appendix is visualized. No evidence of inflammatory process or abnormal fluid collections. No evidence of bowel wall thickening or dilatation.  IMPRESSION: No acute findings.  Bilateral renal parenchymal atrophy, consistent with end-stage renal disease. No evidence of hydronephrosis.   Electronically Signed   By: Earle Gell M.D.   On: 10/15/2013 19:50   Dg Abd Portable 1v  10/15/2013   CLINICAL DATA:  Nasogastric tube placement at bedside.  EXAM: PORTABLE ABDOMEN - 1 VIEW  COMPARISON:  10/04/2013.  FINDINGS: Nasogastric tube tip in the fundus of the stomach. Bowel gas pattern unremarkable.  IMPRESSION: Nasogastric tube tip in the fundus of the stomach. No acute abdominal abnormality.   Electronically Signed   By: Evangeline Dakin M.D.   On: 10/15/2013 03:41    Scheduled Meds: . bisacodyl  5 mg Oral BID  .  darbepoetin (ARANESP) injection - DIALYSIS  25 mcg Intravenous Q Tue-HD  . feeding supplement (PRO-STAT SUGAR FREE 64)  30 mL Per Tube Q1200  . FLUoxetine  20 mg Per Tube Daily  . heparin  5,000 Units Subcutaneous 3 times per day  . insulin aspart  0-20 Units Subcutaneous 6 times per day  . labetalol  300 mg Oral BID  . multivitamin  1 tablet Oral QHS  . neomycin-bacitracin-polymyxin   Topical TID  . ondansetron (ZOFRAN) IV  4 mg Intravenous 3 times per day  . piperacillin-tazobactam (ZOSYN)  IV  2.25 g Intravenous 3 times per day  . sodium chloride  3 mL Intravenous Q12H  . vancomycin  1,000 mg Intravenous Q T,Th,Sa-HD   Continuous Infusions: . dextrose 5 % and 0.9% NaCl 10 mL/hr at 10/16/13 0009  . feeding supplement (NEPRO CARB STEADY) 1,000 mL (10/16/13 1102)    Active Problems:   HYPERTENSION   HEPATITIS C, HX OF   CEREBROVASCULAR ACCIDENT, HX OF   BENIGN PROSTATIC HYPERTROPHY   Hyperlipidemia   Anemia due to chronic illness   DM (diabetes mellitus) type I controlled with renal manifestation   Chronic combined systolic (EF 28%) and grade 2diastolic congestive heart failure   FTT (failure to thrive) in adult   Encephalopathy, metabolic   Dehydration   Fever    Time spent: > 35 minutes    Velvet Bathe  [facility] Pager [phone]. If 7PM-7AM, please contact night-coverage at [email or web address], password [facility] 10/16/2013, 4:07 PM  LOS: 15 days

## 2013-10-16 NOTE — Progress Notes (Signed)
EEG Completed; Results Pending  

## 2013-10-17 LAB — RENAL FUNCTION PANEL
Albumin: 2.3 g/dL — ABNORMAL LOW (ref 3.5–5.2)
BUN: 45 mg/dL — ABNORMAL HIGH (ref 6–23)
CALCIUM: 9.7 mg/dL (ref 8.4–10.5)
CO2: 26 meq/L (ref 19–32)
Chloride: 90 mEq/L — ABNORMAL LOW (ref 96–112)
Creatinine, Ser: 7.52 mg/dL — ABNORMAL HIGH (ref 0.50–1.35)
GFR, EST AFRICAN AMERICAN: 8 mL/min — AB (ref >90)
GFR, EST NON AFRICAN AMERICAN: 7 mL/min — AB (ref >90)
Glucose, Bld: 149 mg/dL — ABNORMAL HIGH (ref 70–99)
PHOSPHORUS: 7.1 mg/dL — AB (ref 2.3–4.6)
Potassium: 4 mEq/L (ref 3.7–5.3)
SODIUM: 138 meq/L (ref 137–147)

## 2013-10-17 LAB — COMPREHENSIVE METABOLIC PANEL
ALBUMIN: 2.3 g/dL — AB (ref 3.5–5.2)
ALT: 10 U/L (ref 0–53)
AST: 20 U/L (ref 0–37)
Alkaline Phosphatase: 55 U/L (ref 39–117)
BUN: 44 mg/dL — ABNORMAL HIGH (ref 6–23)
CALCIUM: 9.6 mg/dL (ref 8.4–10.5)
CO2: 25 mEq/L (ref 19–32)
CREATININE: 7.49 mg/dL — AB (ref 0.50–1.35)
Chloride: 89 mEq/L — ABNORMAL LOW (ref 96–112)
GFR calc Af Amer: 8 mL/min — ABNORMAL LOW (ref >90)
GFR calc non Af Amer: 7 mL/min — ABNORMAL LOW (ref >90)
Glucose, Bld: 145 mg/dL — ABNORMAL HIGH (ref 70–99)
Potassium: 4 mEq/L (ref 3.7–5.3)
Sodium: 137 mEq/L (ref 137–147)
Total Bilirubin: 0.4 mg/dL (ref 0.3–1.2)
Total Protein: 8.3 g/dL (ref 6.0–8.3)

## 2013-10-17 LAB — CBC
HCT: 27.3 % — ABNORMAL LOW (ref 39.0–52.0)
Hemoglobin: 8.7 g/dL — ABNORMAL LOW (ref 13.0–17.0)
MCH: 27.3 pg (ref 26.0–34.0)
MCHC: 31.9 g/dL (ref 30.0–36.0)
MCV: 85.6 fL (ref 78.0–100.0)
PLATELETS: 390 10*3/uL (ref 150–400)
RBC: 3.19 MIL/uL — AB (ref 4.22–5.81)
RDW: 20.6 % — ABNORMAL HIGH (ref 11.5–15.5)
WBC: 8.7 10*3/uL (ref 4.0–10.5)

## 2013-10-17 LAB — GLUCOSE, CAPILLARY
GLUCOSE-CAPILLARY: 155 mg/dL — AB (ref 70–99)
Glucose-Capillary: 149 mg/dL — ABNORMAL HIGH (ref 70–99)
Glucose-Capillary: 164 mg/dL — ABNORMAL HIGH (ref 70–99)

## 2013-10-17 LAB — MAGNESIUM: Magnesium: 2.5 mg/dL (ref 1.5–2.5)

## 2013-10-17 MED ORDER — LIDOCAINE-PRILOCAINE 2.5-2.5 % EX CREA: 1.0000 "application " | TOPICAL_CREAM | CUTANEOUS | Status: DC | PRN

## 2013-10-17 MED ORDER — HEPARIN SODIUM (PORCINE) 1000 UNIT/ML DIALYSIS
1000.0000 [IU] | INTRAMUSCULAR | Status: DC | PRN
  Filled 2013-10-17: qty 1

## 2013-10-17 MED ORDER — LABETALOL HCL 100 MG PO TABS
100.0000 mg | ORAL_TABLET | Freq: Two times a day (BID) | ORAL | Status: DC
  Administered 2013-10-17 – 2013-10-23 (×10): 100 mg via ORAL
  Filled 2013-10-17 (×13): qty 1

## 2013-10-17 MED ORDER — LORAZEPAM 2 MG/ML IJ SOLN
1.0000 mg | Freq: Once | INTRAMUSCULAR | Status: AC
  Administered 2013-10-17: 1 mg via INTRAVENOUS
  Filled 2013-10-17: qty 1

## 2013-10-17 MED ORDER — ALTEPLASE 2 MG IJ SOLR
2.0000 mg | Freq: Once | INTRAMUSCULAR | Status: DC | PRN
  Filled 2013-10-17: qty 2

## 2013-10-17 MED ORDER — DARBEPOETIN ALFA-POLYSORBATE 25 MCG/0.42ML IJ SOLN
INTRAMUSCULAR | Status: AC
  Administered 2013-10-17: 25 ug via INTRAVENOUS
  Filled 2013-10-17: qty 0.42

## 2013-10-17 MED ORDER — NEPRO/CARBSTEADY PO LIQD: 237.0000 mL | ORAL | Status: DC | PRN

## 2013-10-17 MED ORDER — HEPARIN SODIUM (PORCINE) 1000 UNIT/ML DIALYSIS: 20.0000 [IU]/kg | INTRAMUSCULAR | Status: DC | PRN

## 2013-10-17 MED ORDER — LIDOCAINE HCL (PF) 1 % IJ SOLN: 5.0000 mL | INTRAMUSCULAR | Status: DC | PRN

## 2013-10-17 MED ORDER — SODIUM CHLORIDE 0.9 % IV SOLN: 100.0000 mL | INTRAVENOUS | Status: DC | PRN

## 2013-10-17 MED ORDER — PENTAFLUOROPROP-TETRAFLUOROETH EX AERO: 1.0000 "application " | INHALATION_SPRAY | CUTANEOUS | Status: DC | PRN

## 2013-10-17 NOTE — Progress Notes (Signed)
Physical Therapy Treatment Patient Details Name: Oscar Castillo MRN: [id number] DOB: [birth year] Today's Date: 11/08/13    History of Present Illness 63 y.o. male admitted to [facility] on 10/01/13 with AMS and FTT.  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 Of note, he has history of L BKA as well. Pt with significant PMHx of diabetes and severe peripheral vascular disease who also has end-stage renal disease and undergoes dialysis.  S/p revision right transtibial amputation 3/18    PT Comments    Patient little more interactive this session and able to respond to commands with increased time and guidance.  Seemed fatigued following dialysis today.  Follow Up Recommendations  SNF;Supervision/Assistance - 24 hour     Equipment Recommendations  None recommended by PT    Recommendations for Other Services  None     Precautions / Restrictions Precautions Precautions: Fall Precaution Comments: pt also with poor vision, wife reporting he has to have cataract surgery Restrictions RLE Weight Bearing: Non weight bearing LLE Weight Bearing: Non weight bearing Other Position/Activity Restrictions: bil BKA    Mobility  Bed Mobility Overal bed mobility: Needs Assistance Bed Mobility: Rolling Rolling: Mod assist;Max assist Sidelying to sit: Mod assist Supine to sit: Max assist     General bed mobility comments: rolss left better than right with hand over hand guidance to railing    Balance Overall balance assessment: Needs assistance Sitting-balance support: Bilateral upper extremity supported;Single extremity supported Sitting balance-Leahy Scale: Poor Sitting balance - Comments: UE assist and supervision to minguard sitting edge of bed x 15 minutes                            Cognition Arousal/Alertness: Awake/alert Behavior During Therapy: Flat affect Overall Cognitive Status: Impaired/Different from baseline Area of Impairment: Following  commands;Attention;Problem solving     Memory: Decreased short-term memory Following Commands: Follows one step commands with increased time Safety/Judgement: Decreased awareness of deficits   Problem Solving: Slow processing;Decreased initiation;Requires verbal cues;Requires tactile cues      Exercises Other Exercises Other Exercises: seated head and neck rotation and flex/ext AAROM Other Exercises: trunk extension seated for lumbar lordosis, posture and increased head upright    General Comments General comments (skin integrity, edema, etc.): soiled with feces in bed, rolling for cleaning      Pertinent Vitals/Pain Painful in groin with cleaning           PT Goals (current goals can now be found in the care plan section) Progress towards PT goals: Progressing toward goals    Frequency  Min 2X/week    PT Plan Current plan remains appropriate    End of Session   Activity Tolerance: Patient limited by fatigue Patient left: in bed;with call bell/phone within reach;with bed alarm set     Time: 1330-1408 PT Time Calculation (min): 38 min  Charges:  $Therapeutic Activity: 38-52 mins                    G Codes:      Shatana Saxton,CYNDI 2013/11/08, 5:09 PM Magda Kiel, [place] 2013/11/08

## 2013-10-17 NOTE — Progress Notes (Signed)
ANTIBIOTIC CONSULT NOTE - FOLLOW UP  Pharmacy Consult for vancomycin and zosyn Indication: right BKA stump shows abscess/osteomyelitis    No Known Allergies  Patient Measurements: Height:  (bilateral amputee) Weight: 175 lb 0.7 oz (79.4 kg) IBW/kg (Calculated) : 40.8   Vital Signs: Temp: 97.8 F (36.6 C) (03/24 1227) Temp src: Axillary (03/24 1227) BP: 119/77 mmHg (03/24 1227) Pulse Rate: 100 (03/24 1227) Intake/Output from previous day: 03/23 0701 - 03/24 0700 In: 469 [I.V.:269; IV Piggyback:200] Out: 2 [Stool:2] Intake/Output from this shift: Total I/O In: 0  Out: 668 [Other:668]  Labs:  Recent Labs  10/15/13 0844 10/16/13 0540 10/17/13 0758  WBC 9.0 9.2 8.7  HGB 10.0* 9.3* 8.7*  PLT 455* 405* 390  CREATININE 3.92* 5.61* 7.52*   Estimated Creatinine Clearance: 8.1 ml/min (by C-G formula based on Cr of 7.52). No results found for this basename: VANCOTROUGH, Corlis Leak, VANCORANDOM, GENTTROUGH, GENTPEAK, GENTRANDOM, TOBRATROUGH, TOBRAPEAK, TOBRARND, AMIKACINPEAK, AMIKACINTROU, AMIKACIN,  in the last 72 hours   Microbiology: Recent Results (from the past 720 hour(s))  CULTURE, BLOOD (ROUTINE X 2)     Status: None   Collection Time    10/01/13 11:35 AM      Result Value Ref Range Status   Specimen Description BLOOD RIGHT WRIST   Final   Special Requests BOTTLES DRAWN AEROBIC ONLY 10CC   Final   Culture  Setup Time     Final   Value: 10/01/2013 19:00     Performed at [facility]   Culture     Final   Value: NO GROWTH 5 DAYS     Performed at [facility]   Report Status 10/07/2013 FINAL   Final  CULTURE, BLOOD (ROUTINE X 2)     Status: None   Collection Time    10/01/13 12:01 PM      Result Value Ref Range Status   Specimen Description BLOOD RIGHT FOREARM   Final   Special Requests BOTTLES DRAWN AEROBIC AND ANAEROBIC 5CC   Final   Culture  Setup Time     Final   Value: 10/01/2013 18:59     Performed at [facility]   Culture      Final   Value: NO GROWTH 5 DAYS     Performed at [facility]   Report Status 10/07/2013 FINAL   Final  CSF CULTURE     Status: None   Collection Time    10/02/13 10:55 AM      Result Value Ref Range Status   Specimen Description CSF   Final   Special Requests Normal   Final   Gram Stain     Final   Value: FEW WBC PRESENT, PREDOMINANTLY MONONUCLEAR     NO ORGANISMS SEEN     Performed at [facility]   Culture     Final   Value: NO GROWTH 3 DAYS     Performed at [facility]   Report Status 10/06/2013 FINAL   Final  GRAM STAIN     Status: None   Collection Time    10/02/13 10:55 AM      Result Value Ref Range Status   Specimen Description CSF   Final   Special Requests Normal   Final   Gram Stain     Final   Value: CYTOSPIN PREP     WBC PRESENT, PREDOMINANTLY MONONUCLEAR     NO ORGANISMS SEEN   Report Status 10/02/2013 FINAL   Final  MRSA PCR SCREENING     Status: None   Collection Time    10/02/13 11:18 PM      Result Value Ref Range Status   MRSA by PCR NEGATIVE  NEGATIVE Final   Comment:            The GeneXpert MRSA Assay (FDA     approved for NASAL specimens     only), is one component of a     comprehensive MRSA colonization     surveillance program. It is not     intended to diagnose MRSA     infection nor to guide or     monitor treatment for     MRSA infections.  CLOSTRIDIUM DIFFICILE BY PCR     Status: None   Collection Time    10/05/13  2:05 AM      Result Value Ref Range Status   C difficile by pcr NEGATIVE  NEGATIVE Final  CULTURE, BLOOD (ROUTINE X 2)     Status: None   Collection Time    10/05/13 10:42 PM      Result Value Ref Range Status   Specimen Description BLOOD RIGHT UPPER ARM   Final   Special Requests BOTTLES DRAWN AEROBIC AND ANAEROBIC 10CC   Final   Culture  Setup Time     Final   Value: 10/06/2013 03:56     Performed at [facility]   Culture     Final   Value: NO GROWTH 5 DAYS     Performed at  [facility]   Report Status 10/12/2013 FINAL   Final  CULTURE, BLOOD (ROUTINE X 2)     Status: None   Collection Time    10/05/13 10:56 PM      Result Value Ref Range Status   Specimen Description BLOOD RIGHT FOREARM   Final   Special Requests BOTTLES DRAWN AEROBIC AND ANAEROBIC 10CC   Final   Culture  Setup Time     Final   Value: 10/06/2013 03:56     Performed at [facility]   Culture     Final   Value: NO GROWTH 5 DAYS     Performed at [facility]   Report Status 10/12/2013 FINAL   Final  CULTURE, BLOOD (ROUTINE X 2)     Status: None   Collection Time    10/14/13  3:45 PM      Result Value Ref Range Status   Specimen Description BLOOD RIGHT ARM   Final   Special Requests BOTTLES DRAWN AEROBIC ONLY 7 CC   Final   Culture  Setup Time     Final   Value: 10/14/2013 20:19     Performed at [facility]   Culture     Final   Value:        BLOOD CULTURE RECEIVED NO GROWTH TO DATE CULTURE WILL BE HELD FOR 5 DAYS BEFORE ISSUING A FINAL NEGATIVE REPORT     Performed at [facility]   Report Status PENDING   Incomplete  CULTURE, BLOOD (ROUTINE X 2)     Status: None   Collection Time    10/14/13  3:50 PM      Result Value Ref Range Status   Specimen Description BLOOD RIGHT HAND   Final   Special Requests BOTTLES DRAWN AEROBIC ONLY 5 CC   Final   Culture  Setup Time     Final   Value: 10/14/2013 20:19  Performed at [facility]     Final   Value:        BLOOD CULTURE RECEIVED NO GROWTH TO DATE CULTURE WILL BE HELD FOR 5 DAYS BEFORE ISSUING A FINAL NEGATIVE REPORT     Performed at [facility]   Report Status PENDING   Incomplete    Anti-infectives   Start     Dose/Rate Route Frequency Ordered Stop   10/14/13 1600  piperacillin-tazobactam (ZOSYN) IVPB 2.25 g     2.25 g 100 mL/hr over 30 Minutes Intravenous 3 times per day 10/14/13 1455     10/14/13 1600  vancomycin (VANCOCIN) IVPB 1000 mg/200 mL premix      1,000 mg 200 mL/hr over 60 Minutes Intravenous Every T-Th-Sa (Hemodialysis) 10/14/13 1455     10/11/13 0600  vancomycin (VANCOCIN) IVPB 1000 mg/200 mL premix  Status:  Discontinued     1,000 mg 200 mL/hr over 60 Minutes Intravenous On call to O.R. 10/10/13 1432 10/11/13 1201   10/07/13 1415  piperacillin-tazobactam (ZOSYN) IVPB 2.25 g  Status:  Discontinued     2.25 g 100 mL/hr over 30 Minutes Intravenous 3 times per day 10/07/13 1402 10/12/13 1217   10/03/13 1200  vancomycin (VANCOCIN) IVPB 750 mg/150 ml premix  Status:  Discontinued     750 mg 150 mL/hr over 60 Minutes Intravenous Every T-Th-Sa (Hemodialysis) 10/01/13 1158 10/12/13 1217   10/02/13 1200  aztreonam (AZACTAM) 1 g in dextrose 5 % 50 mL IVPB  Status:  Discontinued     1 g 100 mL/hr over 30 Minutes Intravenous Every 24 hours 10/01/13 1154 10/07/13 1401   10/01/13 1145  aztreonam (AZACTAM) 2 g in dextrose 5 % 50 mL IVPB     2 g 100 mL/hr over 30 Minutes Intravenous  Once 10/01/13 1131 10/01/13 1241   10/01/13 1145  vancomycin (VANCOCIN) IVPB 1000 mg/200 mL premix     1,000 mg 200 mL/hr over 60 Minutes Intravenous  Once 10/01/13 1131 10/10/13 1558      Assessment: Patient is a 63 y.o M with ESRD on vancomycin and zosyn for right BKA stump shows abscess/osteomyelitis -- s/p right BKA revision on 3/18.  All cultures have been negative thus far and has remained afebrile.   vanc 3/16>>3/19>> resume 3/21>> Zosyn 3/16>>3/19>> resume 3/21>>  3/16 urine NEG 3/16 blood>>neg 3/21 bcx x2>> ngtd   Goal of Therapy:  Pre-HD vancomycin level 15-25  Plan:  1) continue vancomycin 1gm IV after HD on TTS and zosyn 2.25gm IV q8h 2) please indicate plan for abx  Jayvin Hurrell P 10/17/2013,1:29 PM

## 2013-10-17 NOTE — Procedures (Signed)
I was present at this dialysis session, have reviewed the session itself and made  appropriate changes  Rob Anjel Perfetti MD (pgr) [phone]    (c) [phone] 10/17/2013, 11:29 AM   

## 2013-10-17 NOTE — Progress Notes (Signed)
[facility] PROGRESS NOTE  Oscar Castillo PQZ:[id number] DOB: [birth year] DOA: 10/01/2013 PCP: [name], MD Brief narrative: Please see [facility] signout note  Assessment/Plan:  Encephalopathy, metabolic  -At this juncture etiology uncertain.  Thought to be secondary to infection but patient has had abscess drainage/revision of right BKA stump. Could be secondary to CVA, neurology consulted and workup underway. Still no improvement. RPR and HIV Non reactive, B12 and folate levels within normal limits - consulted Psychiatry who currently recommends liquid antidepressant and has diagnosed patient with depressive disorder 2ary to general medical condition. Patient started on liquid antidepressant. - chest x ray obtained and reported as no active disease - CT of abd and pelvis negative for source of infection.  - Will obtain MRI of right BKA stump to see if there are still any more signs of infection.   New LBBB  -has been transient in nature  -systolic function normal but septal motion with abnormal function and dyssynergy  -cardiac enzymes negative    Dehydration/FTT (failure to thrive) in adult  - Speech and Land (SLP) attempted 10/02/2013 the patient unable to arouse enough to eval but later re examined and now on D1 diet  -Started NG tube feeds as patient had poor oral intake soon after orthopedic surgery.  HYPERTENSION  -Continue labetalol 100 mg BID  -Continue hydralazine 5 mg PRN SBP> 160 or DBP> 100   Anemia due to chronic illness  -Will continue to monitor.  DM (diabetes mellitus) type I controlled with renal manifestation  -On Lantus prior to admission  -Continue resistant SSI; CBG currently controlled on this regimen   CKD (chronic kidney disease) stage V requiring chronic dialysis  -Usual dialysis days Tuesday Thursday Saturday   Chronic combined systolic (EF 33%) and grade 2 diastolic congestive heart failure  -Compensated and managed  with dialysis on T/Th/Sat   HEPATITIS C, HX OF  CEREBROVASCULAR ACCIDENT, HX OF  -Neurology on board and currently investigating for new stroke. Suspecting parietal stroke  Hyperlipidemia  -stable  Right BKA  -Pain is increasing since surgery in February 2015. MRI 3/13 shows pathology see results below  -3/14 MRI of right BKA stump shows abscess/osteomyelitis  s/p  abscess drainage/revision of right BKA stump on 3/18 - Place order for MRI of right stump to be done again to ensure patient does not have an infection.  Osteomyelitis right BKA stump  - Please see above.   Code Status: Full  Family Communication: Discussed case with wife Disposition Plan: Awaiting improvement in medical condition, Wife would not like feeding tube placement but is ok with NG tube at this moment. Please reiterate poor prognosis should patient come back with new onset stroke   Consultants:  Nephrology   Palliative  Orthopaedic  Neurology  Procedures:  As listed above  Antibiotics:  Vanc and zosyn  HPI/Subjective: No new problems reported overnight.  Objective: Filed Vitals:   10/17/13 1748  BP: 115/70  Pulse: 95  Temp: 98.1 F (36.7 C)  Resp: 18    Intake/Output Summary (Last 24 hours) at 10/17/13 1753 Last data filed at 10/17/13 1749  Gross per 24 hour  Intake 568.99 ml  Output    668 ml  Net -99.01 ml   Filed Weights   10/16/13 2050 10/17/13 0748 10/17/13 1211  Weight: 74 kg (163 lb 2.3 oz) 80 kg (176 lb 5.9 oz) 79.4 kg (175 lb 0.7 oz)    Exam:   General:  Pt in NAD, Alert and awake  Cardiovascular: RRR, no mrg  Respiratory: CTA BL, no wheezes, no increased wob  Abdomen: soft, ND, NT  Musculoskeletal: no cyanosis at upper extremities.   Data Reviewed: Basic Metabolic Panel:  Recent Labs Lab 10/13/13 0500 10/14/13 1659 10/15/13 0844 10/16/13 0540 10/17/13 0500 10/17/13 0758  NA 140 143 138 136* 137 138  K 4.2 4.8 4.7 4.3 4.0 4.0  CL 98 100 91* 90*  89* 90*  CO2 25 26 27 26 25 26   GLUCOSE 128* 114* 109* 137* 145* 149*  BUN 16 33* 19 32* 44* 45*  CREATININE 3.89* 7.01* 3.92* 5.61* 7.49* 7.52*  CALCIUM 9.6 9.3 9.5 9.7 9.6 9.7  MG 2.1 2.4 2.2 2.3 2.5  --   PHOS  --  6.5*  --   --   --  7.1*   Liver Function Tests:  Recent Labs Lab 10/12/13 0500 10/13/13 0500 10/14/13 1659 10/15/13 0844 10/16/13 0540 10/17/13 0500 10/17/13 0758  AST 23 26  --  31 21 20   --   ALT 14 13  --  15 11 10   --   ALKPHOS 62 59  --  66 58 55  --   BILITOT 0.5 0.5  --  0.6 0.5 0.4  --   PROT 7.5 7.8  --  9.1* 8.4* 8.3  --   ALBUMIN 2.4* 2.3* 2.3* 2.6* 2.4* 2.3* 2.3*   No results found for this basename: LIPASE, AMYLASE,  in the last 168 hours No results found for this basename: AMMONIA,  in the last 168 hours CBC:  Recent Labs Lab 10/11/13 0528 10/12/13 0500 10/13/13 0500 10/14/13 1659 10/15/13 0844 10/16/13 0540 10/17/13 0758  WBC 6.0 7.6 9.8 8.8 9.0 9.2 8.7  NEUTROABS 3.4 5.4 6.1  --  6.2 5.9  --   HGB 10.7* 9.2* 9.1* 8.6* 10.0* 9.3* 8.7*  HCT 32.9* 28.2* 28.5* 26.4* 30.8* 28.6* 27.3*  MCV 84.6 83.9 85.8 85.2 85.8 85.6 85.6  PLT 371 411* 430* 461* 455* 405* 390   Cardiac Enzymes: No results found for this basename: CKTOTAL, CKMB, CKMBINDEX, TROPONINI,  in the last 168 hours BNP (last 3 results)  Recent Labs  10/01/13 1135  PROBNP 1397.0*   CBG:  Recent Labs Lab 10/16/13 1608 10/16/13 2053 10/16/13 2336 10/17/13 0500 10/17/13 1225  GLUCAP 152* 131* 138* 149* 155*    Recent Results (from the past 240 hour(s))  CULTURE, BLOOD (ROUTINE X 2)     Status: None   Collection Time    10/14/13  3:45 PM      Result Value Ref Range Status   Specimen Description BLOOD RIGHT ARM   Final   Special Requests BOTTLES DRAWN AEROBIC ONLY 7 CC   Final   Culture  Setup Time     Final   Value: 10/14/2013 20:19     Performed at [facility]   Culture     Final   Value:        BLOOD CULTURE RECEIVED NO GROWTH TO DATE CULTURE WILL  BE HELD FOR 5 DAYS BEFORE ISSUING A FINAL NEGATIVE REPORT     Performed at [facility]   Report Status PENDING   Incomplete  CULTURE, BLOOD (ROUTINE X 2)     Status: None   Collection Time    10/14/13  3:50 PM      Result Value Ref Range Status   Specimen Description BLOOD RIGHT HAND   Final   Special Requests BOTTLES DRAWN AEROBIC ONLY 5 CC  Final   Culture  Setup Time     Final   Value: 10/14/2013 20:19     Performed at [facility]   Culture     Final   Value:        BLOOD CULTURE RECEIVED NO GROWTH TO DATE CULTURE WILL BE HELD FOR 5 DAYS BEFORE ISSUING A FINAL NEGATIVE REPORT     Performed at [facility]   Report Status PENDING   Incomplete     Studies: Ct Head Wo Contrast  10/16/2013   CLINICAL DATA:  Altered mental status.  Abnormal EEG.  EXAM: CT HEAD WITHOUT CONTRAST  TECHNIQUE: Contiguous axial images were obtained from the base of the skull through the vertex without intravenous contrast.  COMPARISON:  10/01/2013  FINDINGS: Mild cerebral atrophy is unchanged. Periventricular white matter hypodensities do not appear significantly changed and are nonspecific but compatible with mild chronic small vessel ischemic disease. There is no evidence of acute cortical infarct, intracranial hemorrhage, mass, midline shift, or extra-axial fluid collection. Prior left cataract surgery is noted. The visualized mastoid air cells are clear. Minimal maxillary sinus mucosal thickening is noted bilaterally. Thickening of the left maxillary sinus wall suggestive of chronic sinusitis.  IMPRESSION: 1. No evidence of acute intracranial abnormality. 2. Unchanged, mild cerebral atrophy and chronic small vessel ischemic disease.   Electronically Signed   By: Logan Bores   On: 10/16/2013 22:57    Scheduled Meds: . bisacodyl  5 mg Oral BID  . darbepoetin (ARANESP) injection - DIALYSIS  25 mcg Intravenous Q Tue-HD  . feeding supplement (PRO-STAT SUGAR FREE 64)  30 mL Per Tube Q1200   . FLUoxetine  20 mg Per Tube Daily  . heparin  5,000 Units Subcutaneous 3 times per day  . insulin aspart  0-20 Units Subcutaneous 6 times per day  . labetalol  300 mg Oral BID  . multivitamin  1 tablet Oral QHS  . neomycin-bacitracin-polymyxin   Topical TID  . ondansetron (ZOFRAN) IV  4 mg Intravenous 3 times per day  . piperacillin-tazobactam (ZOSYN)  IV  2.25 g Intravenous 3 times per day  . sodium chloride  3 mL Intravenous Q12H  . vancomycin  1,000 mg Intravenous Q T,Th,Sa-HD   Continuous Infusions: . dextrose 5 % and 0.9% NaCl 10 mL/hr at 10/16/13 0009  . feeding supplement (NEPRO CARB STEADY) 1,000 mL (10/16/13 1102)    Active Problems:   HYPERTENSION   HEPATITIS C, HX OF   CEREBROVASCULAR ACCIDENT, HX OF   BENIGN PROSTATIC HYPERTROPHY   Hyperlipidemia   Anemia due to chronic illness   DM (diabetes mellitus) type I controlled with renal manifestation   Chronic combined systolic (EF 32%) and grade 2diastolic congestive heart failure   FTT (failure to thrive) in adult   Encephalopathy, metabolic   Dehydration   Fever    Time spent: > 35 minutes    Velvet Bathe  [facility] Pager [phone]. If 7PM-7AM, please contact night-coverage at [email or web address], password [facility] 10/17/2013, 5:53 PM  LOS: 16 days

## 2013-10-17 NOTE — Progress Notes (Signed)
Patient has 200 cc gastric residual. Tube feeding held until further notice. Schorr NP paged. [name]

## 2013-10-17 NOTE — Progress Notes (Signed)
[facility] Progress Note  Subjective:  No complaints  Objective Filed Vitals:   10/16/13 2050 10/17/13 0503 10/17/13 0748 10/17/13 0753  BP: 127/67 120/77 124/87 117/83  Pulse: 87 92 90 88  Temp: 98.7 F (37.1 C) 99 F (37.2 C)    TempSrc: Oral Axillary    Resp: 18 18 18 18   Height:      Weight: 74 kg (163 lb 2.3 oz)  80 kg (176 lb 5.9 oz)   SpO2: 100% 100% 97%    Physical Exam goal 3 L General: speaks a little but will not open eyes for me nor answer questions Heart: RRR Lungs: occ coarse BS will not follow requests for deep breaths Abdomen: soft NTNT Extremities: right BKA wrapped; left BKA few scabs - no edema Dialysis Access: left AVF Qb 400  [facility]  4h 48min 72.5kg 2/2.0 [place] LUA AVF Heparin 5000 / 2400  Hectorol 1 Epo none Venofer none   Assessment/Plan:  1. AMS - no improvement; multifactorial= depression/ ho cva/ acute Illness/ repeat Head CT neg acute findings Neurology concerned about possible CVA, but pt unable to do MRI yesterday due to agitation 2. Fever - with no incr wbc Yesterday abdom CT no acute abnormality -  on Vanc and zosyn  2. SP R BKA revision 3/18.= on vanco /zosyn; last seen by ortho 3/20 3. ESRD - TTS - HD per routine; - k 4.3 3/23 4. Nutrition - NG placed sec to poor po intake, TF started - nepro titrating flow  5. PVD with bilateral BKAs ( L BKA 06/09/13, R BKA 09/08/13) 6. Hx CVA  7. Hep C + LFT ok  8. FTT/debiliatated/from SNF - palliative care has seen full code  9. Depression - seen by psych - liquid prozac started 3/23 10. Anemia - Hgb 10 > 9.3 3/23 Aranesp 25 Q Tuesday  11. HTN/ volume UF 2.3 with hd SAT. And 73 kg wt yest./ this am 98% o2 sat and weights likely not accurate and highly variable 12. Disp - ? [place], [facility] Beeper [phone] 10/17/2013,8:05 AM  LOS: 16 days   Pt seen, examined, agree w assess/plan as above with additions as indicated.  Kelly Splinter  MD pager [phone]    cell [phone] 10/17/2013, 11:28 AM       Additional Objective Labs: Basic Metabolic Panel:  Recent Labs Lab 10/14/13 1659 10/15/13 0844 10/16/13 0540  NA 143 138 136*  K 4.8 4.7 4.3  CL 100 91* 90*  CO2 26 27 26   GLUCOSE 114* 109* 137*  BUN 33* 19 32*  CREATININE 7.01* 3.92* 5.61*  CALCIUM 9.3 9.5 9.7  PHOS 6.5*  --   --    Liver Function Tests:  Recent Labs Lab 10/13/13 0500 10/14/13 1659 10/15/13 0844 10/16/13 0540  AST 26  --  31 21  ALT 13  --  15 11  ALKPHOS 59  --  66 58  BILITOT 0.5  --  0.6 0.5  PROT 7.8  --  9.1* 8.4*  ALBUMIN 2.3* 2.3* 2.6* 2.4*   CBC:  Recent Labs Lab 10/12/13 0500 10/13/13 0500 10/14/13 1659 10/15/13 0844 10/16/13 0540  WBC 7.6 9.8 8.8 9.0 9.2  NEUTROABS 5.4 6.1  --  6.2 5.9  HGB 9.2* 9.1* 8.6* 10.0* 9.3*  HCT 28.2* 28.5* 26.4* 30.8* 28.6*  MCV 83.9 85.8 85.2 85.8 85.6  PLT 411* 430* 461* 455* 405*   Blood Culture  Component Value Date/Time   SDES BLOOD RIGHT HAND 10/14/2013 1550   SPECREQUEST BOTTLES DRAWN AEROBIC ONLY 5 CC 10/14/2013 1550   CULT  Value:        BLOOD CULTURE RECEIVED NO GROWTH TO DATE CULTURE WILL BE HELD FOR 5 DAYS BEFORE ISSUING A FINAL NEGATIVE REPORT Performed at [facility] 10/14/2013 1550   REPTSTATUS PENDING 10/14/2013 1550  CBG:  Recent Labs Lab 10/16/13 0744 10/16/13 1114 10/16/13 1608 10/16/13 2053 10/16/13 2336  GLUCAP 147* 132* 152* 131* 138*  Medications: . dextrose 5 % and 0.9% NaCl 10 mL/hr at 10/16/13 0009  . feeding supplement (NEPRO CARB STEADY) 1,000 mL (10/16/13 1102)   . bisacodyl  5 mg Oral BID  . darbepoetin (ARANESP) injection - DIALYSIS  25 mcg Intravenous Q Tue-HD  . feeding supplement (PRO-STAT SUGAR FREE 64)  30 mL Per Tube Q1200  . FLUoxetine  20 mg Per Tube Daily  . heparin  5,000 Units Subcutaneous 3 times per day  . insulin aspart  0-20 Units Subcutaneous 6 times per day  . labetalol  300 mg Oral BID  . multivitamin  1  tablet Oral QHS  . neomycin-bacitracin-polymyxin   Topical TID  . ondansetron (ZOFRAN) IV  4 mg Intravenous 3 times per day  . piperacillin-tazobactam (ZOSYN)  IV  2.25 g Intravenous 3 times per day  . sodium chloride  3 mL Intravenous Q12H  . vancomycin  1,000 mg Intravenous Q T,Th,Sa-HD

## 2013-10-17 NOTE — Progress Notes (Signed)
Pt residual now 18ml after 1 hour check. Resumed tube feeding at 45 ml/hr. Will continue to monitor. [name]

## 2013-10-17 NOTE — Progress Notes (Signed)
NEURO HOSPITALIST PROGRESS NOTE   SUBJECTIVE:                                                                                                                        Patient in HD now. Continues to have left gaze preference.  Follows minimal commands.   OBJECTIVE:                                                                                                                           Vital signs in last 24 hours: Temp:  [98.1 F (36.7 C)-99 F (37.2 C)] 99 F (37.2 C) (03/24 0503) Pulse Rate:  [64-96] 80 (03/24 1100) Resp:  [14-18] 14 (03/24 1100) BP: (80-145)/(56-87) 80/56 mmHg (03/24 1100) SpO2:  [97 %-100 %] 97 % (03/24 0748) Weight:  [74 kg (163 lb 2.3 oz)-80 kg (176 lb 5.9 oz)] 80 kg (176 lb 5.9 oz) (03/24 0748)  Intake/Output from previous day: 03/23 0701 - 03/24 0700 In: 469 [I.V.:269; IV Piggyback:200] Out: 2 [Stool:2] Intake/Output this shift:   Nutritional status: NPO  Past Medical History  Diagnosis Date  . ESRD on hemodialysis 05/05/2007    ESRD due to DM/HTN. Started dialysis in November 2013.  HD TTS at [facility] on [place].  [name] BACK PAIN, LUMBAR, CHRONIC 08/06/2009  . BENIGN PROSTATIC HYPERTROPHY 08/01/2010  . CEREBROVASCULAR ACCIDENT, HX OF 08/06/2009  . CHOLELITHIASIS 08/01/2010  . CONGESTIVE HEART FAILURE 03/18/2009  . DEPRESSION 03/18/2009  . DIABETES MELLITUS, TYPE II 03/25/2007  . ERECTILE DYSFUNCTION 03/25/2007  . GERD 03/25/2007  . HEPATITIS C, HX OF 03/25/2007  . HYPERTENSION 03/25/2007  . Morbid obesity 03/25/2007  . NEPHROLITHIASIS, HX OF 03/18/2009  . Complication of anesthesia     wife states pt had trouble waking up with his last surgery in Nov., 2014    Neurologic Exam:  Mental Status:  Alert, opens eyes when name is called, when asked questions he repeats "yes", continues to show a left gaze preference.   Cranial Nerves:  II: Discs flat bilaterally; Visual fields winced to light on the right and showed no  blink threat, pupils equal, round, reactive to light and accommodation  III,IV, VI: ptosis not present, extra-ocular motions--left gaze preference, did not  cross midline to the right with Doll's, corneal reflex intact V,VII: face symmetric VIII: hearing opens eyes to voice  IX,X: cough reflex present  XI: unable to assess  XII: midline tongue extension  Motor:  Moving bilateral UE spontaneously and grip in the left hand.  No grip in right hand.  Withdraws to pain in all 4 extremities.  Sensory: winces to pain bilateral finger noxious stimuli and withdrew legs with noxious stimuli.  Deep Tendon Reflexes:  2+ in the UE no KJ --bilateral BKA Plantars:  Bilateral BKA  Cerebellar:  Unable to assess  Gait: unable to assess  CV: pulses palpable throughout    Lab Results: Basic Metabolic Panel:  Recent Labs Lab 10/12/13 0500 10/13/13 0500 10/14/13 1659 10/15/13 0844 10/16/13 0540 10/17/13 0758  NA 138 140 143 138 136* 138  K 5.1 4.2 4.8 4.7 4.3 4.0  CL 94* 98 100 91* 90* 90*  CO2 24 25 26 27 26 26   GLUCOSE 126* 128* 114* 109* 137* 149*  BUN 31* 16 33* 19 32* 45*  CREATININE 5.73* 3.89* 7.01* 3.92* 5.61* 7.52*  CALCIUM 9.4 9.6 9.3 9.5 9.7 9.7  MG 2.0 2.1 2.4 2.2 2.3  --   PHOS  --   --  6.5*  --   --  7.1*    Liver Function Tests:  Recent Labs Lab 10/11/13 0528 10/12/13 0500 10/13/13 0500 10/14/13 1659 10/15/13 0844 10/16/13 0540 10/17/13 0758  AST 22 23 26   --  31 21  --   ALT 14 14 13   --  15 11  --   ALKPHOS 58 62 59  --  66 58  --   BILITOT 0.3 0.5 0.5  --  0.6 0.5  --   PROT 7.6 7.5 7.8  --  9.1* 8.4*  --   ALBUMIN 2.3* 2.4* 2.3* 2.3* 2.6* 2.4* 2.3*   No results found for this basename: LIPASE, AMYLASE,  in the last 168 hours No results found for this basename: AMMONIA,  in the last 168 hours  CBC:  Recent Labs Lab 10/11/13 0528 10/12/13 0500 10/13/13 0500 10/14/13 1659 10/15/13 0844 10/16/13 0540 10/17/13 0758  WBC 6.0 7.6 9.8 8.8 9.0 9.2 8.7   NEUTROABS 3.4 5.4 6.1  --  6.2 5.9  --   HGB 10.7* 9.2* 9.1* 8.6* 10.0* 9.3* 8.7*  HCT 32.9* 28.2* 28.5* 26.4* 30.8* 28.6* 27.3*  MCV 84.6 83.9 85.8 85.2 85.8 85.6 85.6  PLT 371 411* 430* 461* 455* 405* 390    Cardiac Enzymes: No results found for this basename: CKTOTAL, CKMB, CKMBINDEX, TROPONINI,  in the last 168 hours  Lipid Panel: No results found for this basename: CHOL, TRIG, HDL, CHOLHDL, VLDL, LDLCALC,  in the last 168 hours  CBG:  Recent Labs Lab 10/16/13 1114 10/16/13 1608 10/16/13 2053 10/16/13 2336 10/17/13 0500  GLUCAP 132* 152* 131* 138* 149*    Microbiology: Results for orders placed during the hospital encounter of 10/01/13  CULTURE, BLOOD (ROUTINE X 2)     Status: None   Collection Time    10/01/13 11:35 AM      Result Value Ref Range Status   Specimen Description BLOOD RIGHT WRIST   Final   Special Requests BOTTLES DRAWN AEROBIC ONLY 10CC   Final   Culture  Setup Time     Final   Value: 10/01/2013 19:00     Performed at [facility]   Culture     Final   Value: NO GROWTH  5 DAYS     Performed at [facility]   Report Status 10/07/2013 FINAL   Final  CULTURE, BLOOD (ROUTINE X 2)     Status: None   Collection Time    10/01/13 12:01 PM      Result Value Ref Range Status   Specimen Description BLOOD RIGHT FOREARM   Final   Special Requests BOTTLES DRAWN AEROBIC AND ANAEROBIC 5CC   Final   Culture  Setup Time     Final   Value: 10/01/2013 18:59     Performed at [facility]   Culture     Final   Value: NO GROWTH 5 DAYS     Performed at [facility]   Report Status 10/07/2013 FINAL   Final  CSF CULTURE     Status: None   Collection Time    10/02/13 10:55 AM      Result Value Ref Range Status   Specimen Description CSF   Final   Special Requests Normal   Final   Gram Stain     Final   Value: FEW WBC PRESENT, PREDOMINANTLY MONONUCLEAR     NO ORGANISMS SEEN     Performed at [facility]   Culture      Final   Value: NO GROWTH 3 DAYS     Performed at [facility]   Report Status 10/06/2013 FINAL   Final  GRAM STAIN     Status: None   Collection Time    10/02/13 10:55 AM      Result Value Ref Range Status   Specimen Description CSF   Final   Special Requests Normal   Final   Gram Stain     Final   Value: CYTOSPIN PREP     WBC PRESENT, PREDOMINANTLY MONONUCLEAR     NO ORGANISMS SEEN   Report Status 10/02/2013 FINAL   Final  MRSA PCR SCREENING     Status: None   Collection Time    10/02/13 11:18 PM      Result Value Ref Range Status   MRSA by PCR NEGATIVE  NEGATIVE Final   Comment:            The GeneXpert MRSA Assay (FDA     approved for NASAL specimens     only), is one component of a     comprehensive MRSA colonization     surveillance program. It is not     intended to diagnose MRSA     infection nor to guide or     monitor treatment for     MRSA infections.  CLOSTRIDIUM DIFFICILE BY PCR     Status: None   Collection Time    10/05/13  2:05 AM      Result Value Ref Range Status   C difficile by pcr NEGATIVE  NEGATIVE Final  CULTURE, BLOOD (ROUTINE X 2)     Status: None   Collection Time    10/05/13 10:42 PM      Result Value Ref Range Status   Specimen Description BLOOD RIGHT UPPER ARM   Final   Special Requests BOTTLES DRAWN AEROBIC AND ANAEROBIC 10CC   Final   Culture  Setup Time     Final   Value: 10/06/2013 03:56     Performed at [facility]   Culture     Final   Value: NO GROWTH 5 DAYS     Performed at [facility]  Status 10/12/2013 FINAL   Final  CULTURE, BLOOD (ROUTINE X 2)     Status: None   Collection Time    10/05/13 10:56 PM      Result Value Ref Range Status   Specimen Description BLOOD RIGHT FOREARM   Final   Special Requests BOTTLES DRAWN AEROBIC AND ANAEROBIC 10CC   Final   Culture  Setup Time     Final   Value: 10/06/2013 03:56     Performed at [facility]   Culture     Final   Value: NO GROWTH 5  DAYS     Performed at [facility]   Report Status 10/12/2013 FINAL   Final  CULTURE, BLOOD (ROUTINE X 2)     Status: None   Collection Time    10/14/13  3:45 PM      Result Value Ref Range Status   Specimen Description BLOOD RIGHT ARM   Final   Special Requests BOTTLES DRAWN AEROBIC ONLY 7 CC   Final   Culture  Setup Time     Final   Value: 10/14/2013 20:19     Performed at [facility]   Culture     Final   Value:        BLOOD CULTURE RECEIVED NO GROWTH TO DATE CULTURE WILL BE HELD FOR 5 DAYS BEFORE ISSUING A FINAL NEGATIVE REPORT     Performed at [facility]   Report Status PENDING   Incomplete  CULTURE, BLOOD (ROUTINE X 2)     Status: None   Collection Time    10/14/13  3:50 PM      Result Value Ref Range Status   Specimen Description BLOOD RIGHT HAND   Final   Special Requests BOTTLES DRAWN AEROBIC ONLY 5 CC   Final   Culture  Setup Time     Final   Value: 10/14/2013 20:19     Performed at [facility]   Culture     Final   Value:        BLOOD CULTURE RECEIVED NO GROWTH TO DATE CULTURE WILL BE HELD FOR 5 DAYS BEFORE ISSUING A FINAL NEGATIVE REPORT     Performed at [facility]   Report Status PENDING   Incomplete    Coagulation Studies: No results found for this basename: LABPROT, INR,  in the last 72 hours  Imaging: Ct Head Wo Contrast  10/16/2013   CLINICAL DATA:  Altered mental status.  Abnormal EEG.  EXAM: CT HEAD WITHOUT CONTRAST  TECHNIQUE: Contiguous axial images were obtained from the base of the skull through the vertex without intravenous contrast.  COMPARISON:  10/01/2013  FINDINGS: Mild cerebral atrophy is unchanged. Periventricular white matter hypodensities do not appear significantly changed and are nonspecific but compatible with mild chronic small vessel ischemic disease. There is no evidence of acute cortical infarct, intracranial hemorrhage, mass, midline shift, or extra-axial fluid collection. Prior left cataract  surgery is noted. The visualized mastoid air cells are clear. Minimal maxillary sinus mucosal thickening is noted bilaterally. Thickening of the left maxillary sinus wall suggestive of chronic sinusitis.  IMPRESSION: 1. No evidence of acute intracranial abnormality. 2. Unchanged, mild cerebral atrophy and chronic small vessel ischemic disease.   Electronically Signed   By: Sebastian Ache   On: 10/16/2013 22:57   Ct Abdomen Pelvis W Contrast  10/15/2013   CLINICAL DATA:  Fever of unknown origin. End-stage renal disease on hemodialysis.  EXAM: CT ABDOMEN AND  PELVIS WITH CONTRAST  TECHNIQUE: Multidetector CT imaging of the abdomen and pelvis was performed using the standard protocol following bolus administration of intravenous contrast.  CONTRAST:  145mL OMNIPAQUE IOHEXOL 300 MG/ML  SOLN  COMPARISON:  11/06/2012  FINDINGS: Nasogastric tube is seen within the stomach. The liver, gallbladder, pancreas, spleen, and adrenal glands are normal in appearance. Bilateral renal parenchymal atrophy is seen. No evidence of renal masses or hydronephrosis. No masses or lymphadenopathy seen within the abdomen or pelvis.  Normal appendix is visualized. No evidence of inflammatory process or abnormal fluid collections. No evidence of bowel wall thickening or dilatation.  IMPRESSION: No acute findings.  Bilateral renal parenchymal atrophy, consistent with end-stage renal disease. No evidence of hydronephrosis.   Electronically Signed   By: Earle Gell M.D.   On: 10/15/2013 19:50       MEDICATIONS                                                                                                                        Scheduled: . bisacodyl  5 mg Oral BID  . darbepoetin      . darbepoetin (ARANESP) injection - DIALYSIS  25 mcg Intravenous Q Tue-HD  . feeding supplement (PRO-STAT SUGAR FREE 64)  30 mL Per Tube Q1200  . FLUoxetine  20 mg Per Tube Daily  . heparin  5,000 Units Subcutaneous 3 times per day  . insulin aspart   0-20 Units Subcutaneous 6 times per day  . labetalol  300 mg Oral BID  . multivitamin  1 tablet Oral QHS  . neomycin-bacitracin-polymyxin   Topical TID  . ondansetron (ZOFRAN) IV  4 mg Intravenous 3 times per day  . piperacillin-tazobactam (ZOSYN)  IV  2.25 g Intravenous 3 times per day  . sodium chloride  3 mL Intravenous Q12H  . vancomycin  1,000 mg Intravenous Q T,Th,Sa-HD    ASSESSMENT/PLAN:                                                                                                            63 YO male with confusion prior to hospitalization which seemed to rapidly deteriorate over period of 6 days. Work up this far has been negative. He has aphasia with left gaze deviation concerning for stroke. EEG is negative for seizure. Will need to confirm stroke and exclude hemorrhage with MRI.    1) MRI brain w/o contrast (ordered) 2) further recommendations pending the above study.     Assessment and plan discussed with with attending physician and they  are in agreement.    Etta Quill PA-C [facility] [phone]  10/17/2013, 11:28 AM   He continues to follow simple commands, has persistent left gaze preference. I continue to suspect that this represents stroke. This was not seen on CT, however MRI would be more sensitive. We'll attempt to obtain this with sedation.  Roland Rack, MD [facility] [phone]  If 7pm- 7am, please page neurology on call as listed in [place].

## 2013-10-18 ENCOUNTER — Encounter (HOSPITAL_COMMUNITY): Payer: Self-pay | Admitting: Radiology

## 2013-10-18 DIAGNOSIS — K922 Gastrointestinal hemorrhage, unspecified: Secondary | ICD-10-CM

## 2013-10-18 LAB — CBC WITH DIFFERENTIAL/PLATELET
BASOS ABS: 0 10*3/uL (ref 0.0–0.1)
Basophils Relative: 0 % (ref 0–1)
Eosinophils Absolute: 0.1 10*3/uL (ref 0.0–0.7)
Eosinophils Relative: 1 % (ref 0–5)
HEMATOCRIT: 30.4 % — AB (ref 39.0–52.0)
Hemoglobin: 9.6 g/dL — ABNORMAL LOW (ref 13.0–17.0)
LYMPHS PCT: 18 % (ref 12–46)
Lymphs Abs: 1.8 10*3/uL (ref 0.7–4.0)
MCH: 27.5 pg (ref 26.0–34.0)
MCHC: 31.6 g/dL (ref 30.0–36.0)
MCV: 87.1 fL (ref 78.0–100.0)
MONO ABS: 1 10*3/uL (ref 0.1–1.0)
Monocytes Relative: 9 % (ref 3–12)
Neutro Abs: 7.2 10*3/uL (ref 1.7–7.7)
Neutrophils Relative %: 71 % (ref 43–77)
Platelets: 404 10*3/uL — ABNORMAL HIGH (ref 150–400)
RBC: 3.49 MIL/uL — ABNORMAL LOW (ref 4.22–5.81)
RDW: 20.7 % — ABNORMAL HIGH (ref 11.5–15.5)
WBC: 10.1 10*3/uL (ref 4.0–10.5)

## 2013-10-18 LAB — COMPREHENSIVE METABOLIC PANEL WITH GFR
ALT: 11 U/L (ref 0–53)
AST: 19 U/L (ref 0–37)
Albumin: 2.3 g/dL — ABNORMAL LOW (ref 3.5–5.2)
Alkaline Phosphatase: 57 U/L (ref 39–117)
BUN: 24 mg/dL — ABNORMAL HIGH (ref 6–23)
CO2: 27 meq/L (ref 19–32)
Calcium: 9.6 mg/dL (ref 8.4–10.5)
Chloride: 92 meq/L — ABNORMAL LOW (ref 96–112)
Creatinine, Ser: 4.21 mg/dL — ABNORMAL HIGH (ref 0.50–1.35)
GFR calc Af Amer: 16 mL/min — ABNORMAL LOW
GFR calc non Af Amer: 14 mL/min — ABNORMAL LOW
Glucose, Bld: 155 mg/dL — ABNORMAL HIGH (ref 70–99)
Potassium: 3.3 meq/L — ABNORMAL LOW (ref 3.7–5.3)
Sodium: 136 meq/L — ABNORMAL LOW (ref 137–147)
Total Bilirubin: 0.4 mg/dL (ref 0.3–1.2)
Total Protein: 8.6 g/dL — ABNORMAL HIGH (ref 6.0–8.3)

## 2013-10-18 LAB — GLUCOSE, CAPILLARY
GLUCOSE-CAPILLARY: 163 mg/dL — AB (ref 70–99)
Glucose-Capillary: 122 mg/dL — ABNORMAL HIGH (ref 70–99)
Glucose-Capillary: 142 mg/dL — ABNORMAL HIGH (ref 70–99)
Glucose-Capillary: 158 mg/dL — ABNORMAL HIGH (ref 70–99)
Glucose-Capillary: 154 mg/dL — ABNORMAL HIGH (ref 70–99)
Glucose-Capillary: 108 mg/dL — ABNORMAL HIGH (ref 70–99)
Glucose-Capillary: 109 mg/dL — ABNORMAL HIGH (ref 70–99)

## 2013-10-18 LAB — MAGNESIUM: Magnesium: 2.2 mg/dL (ref 1.5–2.5)

## 2013-10-18 MED ORDER — HALOPERIDOL LACTATE 5 MG/ML IJ SOLN
1.0000 mg | Freq: Once | INTRAMUSCULAR | Status: AC
  Administered 2013-10-18: 1 mg via INTRAMUSCULAR
  Filled 2013-10-18: qty 1

## 2013-10-18 NOTE — Progress Notes (Signed)
[facility] Progress Note  Subjective:   Nursing said he was a little more restless  Objective Filed Vitals:   10/17/13 2024 10/17/13 2313 10/18/13 0443 10/18/13 0817  BP: 134/82 130/79 123/77 133/83  Pulse: 96 95 86 87  Temp: 98.6 F (37 C)  97.5 F (36.4 C) 98.7 F (37.1 C)  TempSrc: Oral  Axillary Oral  Resp: _0 Height:      Weight:      SpO2: 100%  98% 99%   Physical Exam General: nonverbal eyes open  NG infusing Heart: RRR Lungs: seems like he tried to follow command for deep breaths; no rales Abdomen: soft NT Extremities: bilat BKA no edema; right wrapped Dialysis Access: left AVF   [facility]  4h 8mn 72.5kg 2/2.0 Bath LUA AVF Heparin 5000 / 2400  Hectorol 1 Epo none Venofer none   Assessment/Plan:  1. AMS - no improvement; multifactorial= depression/ ho cva/ acute Illness/ repeat Head CT neg acute findings Neurology concerned about possible CVA, but pt unable to do MRI yesterday due to agitation Nursing said to be reattempted - probably tomorrow 2. Fever - with no incr wbc Yesterday abdom CT no acute abnormality - on Vanc and zosyn  2. SP R BKA revision 3/18.= on vanco /zosyn; ortho believes MRI of stump would have low yield 3. ESRD - TTS - HD per routine; - k 4.0 3/23 K 3.3 today - use 4 K bath 4. Nutrition - NG placed sec to poor po intake, TF started - nepro 45/hr 5. PVD with bilateral BKAs ( L BKA 06/09/13, R BKA 09/08/13)  6. Hx CVA - see #1 7. Hep C + LFT ok  8. FTT/debiliatated/from SNF - palliative care has seen full code Palliative care met with pt's wife on 3/22, but "despite poor prognosis" pt's wife did not want to place any limits on care 9. Depression - seen by psych - liquid prozac started 3/23  10. Anemia - Hgb 10 > 9.3 > 9.6 stable  Aranesp 25 Q Tuesday  11. HTN/ volume wts highly variable; UF 2.3 3/21, 668 3/24 with post weight 79.5 and edw 72.5; BP ok 12. MBD hectorol on hold for hypercalcemia - phoslyra (Ca based  only option for TF - use 2 Ca bath when K allows; follow; actually surprised P is high   MMyriam Jacobson PA-C [place]3/25/2015,9:52 AM  LOS: 17 days   Pt seen, examined, agree w assess/plan as above with additions as indicated.  RKelly SplinterMD pager [phone]   cell [phone]/25/2015, 1:25 PM     Additional Objective Labs: Basic Metabolic Panel:  Recent Labs Lab 10/14/13 1659  10/17/13 0500 10/17/13 0758 10/18/13 0405  NA 143  < > 137 138 136*  K 4.8  < > 4.0 4.0 3.3*  CL 100  < > 89* 90* 92*  CO2 26  < > _1 GLUCOSE 114*  < > 145* 149* 155*  BUN 33*  < > 44* 45* 24*  CREATININE 7.01*  < > 7.49* 7.52* 4.21*  CALCIUM 9.3  < > 9.6 9.7 9.6  PHOS 6.5*  --   --  7.1*  --   < > = values in this interval not displayed. Liver Function Tests:  Recent Labs Lab 10/16/13 0540 10/17/13 0500 10/17/13 0758 10/18/13 0405  AST 21 20  --  19  ALT 11 10  --  11  ALKPHOS 58 55  --  34  BILITOT 0.5 0.4  --  0.4  PROT 8.4* 8.3  --  8.6*  ALBUMIN 2.4* 2.3* 2.3* 2.3*   CBC:  Recent Labs Lab 10/14/13 1659 10/15/13 0844 10/16/13 0540 10/17/13 0758 10/18/13 0405  WBC 8.8 9.0 9.2 8.7 10.1  NEUTROABS  --  6.2 5.9  --  7.2  HGB 8.6* 10.0* 9.3* 8.7* 9.6*  HCT 26.4* 30.8* 28.6* 27.3* 30.4*  MCV 85.2 85.8 85.6 85.6 87.1  PLT 461* 455* 405* 390 404*   Blood Culture    Component Value Date/Time   SDES BLOOD RIGHT HAND 10/14/2013 1550   SPECREQUEST BOTTLES DRAWN AEROBIC ONLY 5 CC 10/14/2013 1550   CULT  Value:        BLOOD CULTURE RECEIVED NO GROWTH TO DATE CULTURE WILL BE HELD FOR 5 DAYS BEFORE ISSUING A FINAL NEGATIVE REPORT Performed at [facility] 10/14/2013 1550   REPTSTATUS PENDING 10/14/2013 1550   CBG:  Recent Labs Lab 10/16/13 2053 10/16/13 2336 10/17/13 0500 10/17/13 1225 10/17/13 1748  GLUCAP 131* 138* 149* 155* 164*  SMedications: . dextrose 5 % and 0.9% NaCl 10 mL/hr at 10/16/13 0009  . feeding  supplement (NEPRO CARB STEADY) 1,000 mL (10/17/13 2045)   . bisacodyl  5 mg Oral BID  . darbepoetin (ARANESP) injection - DIALYSIS  25 mcg Intravenous Q Tue-HD  . feeding supplement (PRO-STAT SUGAR FREE 64)  30 mL Per Tube Q1200  . FLUoxetine  20 mg Per Tube Daily  . heparin  5,000 Units Subcutaneous 3 times per day  . insulin aspart  0-20 Units Subcutaneous 6 times per day  . labetalol  100 mg Oral BID  . multivitamin  1 tablet Oral QHS  . neomycin-bacitracin-polymyxin   Topical TID  . ondansetron (ZOFRAN) IV  4 mg Intravenous 3 times per day  . piperacillin-tazobactam (ZOSYN)  IV  2.25 g Intravenous 3 times per day  . sodium chloride  3 mL Intravenous Q12H  . vancomycin  1,000 mg Intravenous Q T,Th,Sa-HD

## 2013-10-18 NOTE — H&P (Signed)
HPI: ASAHD CAN is an 63 y.o. male with altered mental status. MRI/MRA of his brain has been ordered as part of workup to assess for stroke and/or hemmorhage. Chart, PMHx, meds, labs reviewed. Pt does respond to some commands but continues to have altered mental status. It is requested that pt receive moderate conscious sedation for this scan.   Past Medical History:  Past Medical History  Diagnosis Date  . ESRD on hemodialysis 05/05/2007    ESRD due to DM/HTN. Started dialysis in November 2013.  HD TTS at [facility] on [place] [address].  [name] BACK PAIN, LUMBAR, CHRONIC 08/06/2009  . BENIGN PROSTATIC HYPERTROPHY 08/01/2010  . CEREBROVASCULAR ACCIDENT, HX OF 08/06/2009  . CHOLELITHIASIS 08/01/2010  . CONGESTIVE HEART FAILURE 03/18/2009  . DEPRESSION 03/18/2009  . DIABETES MELLITUS, TYPE II 03/25/2007  . ERECTILE DYSFUNCTION 03/25/2007  . GERD 03/25/2007  . HEPATITIS C, HX OF 03/25/2007  . HYPERTENSION 03/25/2007  . Morbid obesity 03/25/2007  . NEPHROLITHIASIS, HX OF 03/18/2009  . Complication of anesthesia     wife states pt had trouble waking up with his last surgery in Nov., 2014    Past Surgical History:  Past Surgical History  Procedure Laterality Date  . Nephrectomy      partial RR  . Av fistula placement  06/14/2012    Procedure: ARTERIOVENOUS (AV) FISTULA CREATION;  Surgeon: Angelia Mould, MD;  Location: [facility] OR;  Service: Vascular;  Laterality: Left;  Left basilic vein transposition with fistula.  . Tibia im nail insertion Left 09/09/2012    Procedure: INTRAMEDULLARY (IM) NAIL TIBIAL;  Surgeon: Johnny Bridge, MD;  Location: [place];  Service: Orthopedics;  Laterality: Left;  left tibial nail and open reduction internal fixation left fibula fracture  . Orif fibula fracture Left 09/09/2012    Procedure: OPEN REDUCTION INTERNAL FIXATION (ORIF) FIBULA FRACTURE;  Surgeon: Johnny Bridge, MD;  Location: [place];  Service: Orthopedics;  Laterality: Left;  . Colonoscopy N/A 10/28/2012     Procedure: COLONOSCOPY;  Surgeon: Jeryl Columbia, MD;  Location: [facility] ENDOSCOPY;  Service: Endoscopy;  Laterality: N/A;  . Esophagogastroduodenoscopy N/A 11/02/2012    Procedure: ESOPHAGOGASTRODUODENOSCOPY (EGD);  Surgeon: Cleotis Nipper, MD;  Location: [facility] ENDOSCOPY;  Service: Endoscopy;  Laterality: N/A;  . Colonoscopy N/A 11/02/2012    Procedure: COLONOSCOPY;  Surgeon: Cleotis Nipper, MD;  Location: [facility] ENDOSCOPY;  Service: Endoscopy;  Laterality: N/A;  . Colonoscopy N/A 11/03/2012    Procedure: COLONOSCOPY;  Surgeon: Cleotis Nipper, MD;  Location: [facility] ENDOSCOPY;  Service: Endoscopy;  Laterality: N/A;  . Givens capsule study N/A 11/04/2012    Procedure: GIVENS CAPSULE STUDY;  Surgeon: Cleotis Nipper, MD;  Location: [facility] ENDOSCOPY;  Service: Endoscopy;  Laterality: N/A;  . Enteroscopy N/A 11/08/2012    Procedure: ENTEROSCOPY;  Surgeon: Wonda Horner, MD;  Location: [facility] ENDOSCOPY;  Service: Endoscopy;  Laterality: N/A;  . Amputation Left 05/12/2013    Procedure: AMPUTATION RAY;  Surgeon: Newt Minion, MD;  Location: [place];  Service: Orthopedics;  Laterality: Left;  Left Foot 1st Ray Amputation  . Eye surgery Left     to remove scar tissue  . Amputation Left 06/09/2013    Procedure: AMPUTATION BELOW KNEE;  Surgeon: Newt Minion, MD;  Location: [place];  Service: Orthopedics;  Laterality: Left;  Left Below Knee Amputation and removal proximal screws IM tibial nail  . Hardware removal Left 06/09/2013    Procedure: HARDWARE REMOVAL;  Surgeon: Newt Minion,  MD;  Location: [place];  Service: Orthopedics;  Laterality: Left;  Left Below Knee Amputation  and Removal proximal screws IM tibial nail  . Amputation Right 09/08/2013    Procedure: AMPUTATION BELOW KNEE;  Surgeon: Newt Minion, MD;  Location: [place];  Service: Orthopedics;  Laterality: Right;  Right Below Knee Amputation  . Amputation Right 10/11/2013    Procedure: AMPUTATION BELOW KNEE;  Surgeon: Newt Minion, MD;  Location: [place];  Service:  Orthopedics;  Laterality: Right;  Right Below Knee Amputation Revision    Family History:  Family History  Problem Relation Age of Onset  . Coronary artery disease Other   . Diabetes Other     Social History:  reports that he has never smoked. He has never used smokeless tobacco. He reports that he does not drink alcohol or use illicit drugs.  Allergies: No Known Allergies  Medications:   Medication List    ASK your doctor about these medications       acetaminophen 500 MG tablet  Commonly known as:  TYLENOL  Take 500 mg by mouth every 6 (six) hours as needed for mild pain.     amLODipine 10 MG tablet  Commonly known as:  NORVASC  Take 10 mg by mouth daily.     brimonidine 0.2 % ophthalmic solution  Commonly known as:  ALPHAGAN  Place 1 drop into both eyes 2 (two) times daily.     calcium acetate 667 MG capsule  Commonly known as:  PHOSLO  Take 2 capsules (1,334 mg total) by mouth 3 (three) times daily with meals.     docusate sodium 100 MG capsule  Commonly known as:  COLACE  Take 1 capsule (100 mg total) by mouth 2 (two) times daily.     doxycycline 100 MG tablet  Commonly known as:  ADOXA  Take 100 mg by mouth 2 (two) times daily. Started on 09/29/13 X 3 weeks     gabapentin 300 MG capsule  Commonly known as:  NEURONTIN  Take 300 mg by mouth daily as needed (for mild to moderate pain).     haloperidol 5 MG tablet  Commonly known as:  HALDOL  Take 5 mg by mouth at bedtime as needed for agitation.     HYDROcodone-acetaminophen 7.5-325 MG per tablet  Commonly known as:  NORCO  Take 1 tablet by mouth every 6 (six) hours as needed for moderate pain.     labetalol 300 MG tablet  Commonly known as:  NORMODYNE  Take 1 tablet (300 mg total) by mouth 3 (three) times daily.     LANTUS SOLOSTAR [place]  Inject 5 Units into the skin every evening.     saccharomyces boulardii 250 MG capsule  Commonly known as:  FLORASTOR  Take 250 mg by mouth 2 (two) times daily.      sertraline 100 MG tablet  Commonly known as:  ZOLOFT  Take 100 mg by mouth daily as needed. For depression     silver sulfADIAZINE 1 % cream  Commonly known as:  SILVADENE  Apply 1 application topically daily.     SIMBRINZA 1-0.2 % Susp  Generic drug:  Brinzolamide-Brimonidine  Place 1 drop into both eyes 2 (two) times daily.     Travoprost (BAK Free) 0.004 % Soln ophthalmic solution  Commonly known as:  TRAVATAN  Place 1 drop into both eyes at bedtime.     traZODone 50 MG tablet  Commonly known as:  DESYREL  Take  50 mg by mouth at bedtime as needed for sleep.     VITAMIN C PO  Take 1 tablet by mouth daily.        Please HPI for pertinent positives, otherwise complete 10 system ROS negative.  Physical Exam: BP 145/73  Pulse 85  Temp(Src) 98.8 F (37.1 C) (Oral)  Resp 18  Ht _0  (1.422 m)  Wt 175 lb 0.7 oz (79.4 kg)  BMI 39.27 kg/m2  SpO2 99% Body mass index is 39.27 kg/(m^2).   General Appearance:  Confused but able to follow some commands  ENT: Unremarkable airway  Neck: Supple, symmetrical, trachea midline  Lungs:   Clear to auscultation bilaterally, no w/r/r  Chest Wall:  No tenderness or deformity  Heart:  Regular rate and rhythm, S1, S2 normal, no murmur, rub or gallop.  Abdomen:   Soft, non-tender, non distended.  Extremities: S/p (B)BKA  Neurologic: Normal affect, no gross deficits.   Results for orders placed during the hospital encounter of 10/01/13 (from the past 48 hour(s))  GLUCOSE, CAPILLARY     Status: Abnormal   Collection Time    10/16/13  8:53 PM      Result Value Ref Range   Glucose-Capillary 131 (*) 70 - 99 mg/dL  GLUCOSE, CAPILLARY     Status: Abnormal   Collection Time    10/16/13 11:36 PM      Result Value Ref Range   Glucose-Capillary 138 (*) 70 - 99 mg/dL  COMPREHENSIVE METABOLIC PANEL     Status: Abnormal   Collection Time    10/17/13  5:00 AM      Result Value Ref Range   Sodium 137  137 - 147 mEq/L   Potassium 4.0  3.7  - 5.3 mEq/L   Chloride 89 (*) 96 - 112 mEq/L   CO2 25  19 - 32 mEq/L   Glucose, Bld 145 (*) 70 - 99 mg/dL   BUN 44 (*) 6 - 23 mg/dL   Creatinine, Ser 7.49 (*) 0.50 - 1.35 mg/dL   Calcium 9.6  8.4 - 10.5 mg/dL   Total Protein 8.3  6.0 - 8.3 g/dL   Albumin 2.3 (*) 3.5 - 5.2 g/dL   AST 20  0 - 37 U/L   ALT 10  0 - 53 U/L   Alkaline Phosphatase 55  39 - 117 U/L   Total Bilirubin 0.4  0.3 - 1.2 mg/dL   GFR calc non Af Amer 7 (*) >90 mL/min   GFR calc Af Amer 8 (*) >90 mL/min   Comment: (NOTE)     The eGFR has been calculated using the CKD EPI equation.     This calculation has not been validated in all clinical situations.     eGFR's persistently <90 mL/min signify possible Chronic Kidney     Disease.  MAGNESIUM     Status: None   Collection Time    10/17/13  5:00 AM      Result Value Ref Range   Magnesium 2.5  1.5 - 2.5 mg/dL  GLUCOSE, CAPILLARY     Status: Abnormal   Collection Time    10/17/13  5:00 AM      Result Value Ref Range   Glucose-Capillary 149 (*) 70 - 99 mg/dL  CBC     Status: Abnormal   Collection Time    10/17/13  7:58 AM      Result Value Ref Range   WBC 8.7  4.0 - 10.5 K/uL   RBC  3.19 (*) 4.22 - 5.81 MIL/uL   Hemoglobin 8.7 (*) 13.0 - 17.0 g/dL   HCT 27.3 (*) 39.0 - 52.0 %   MCV 85.6  78.0 - 100.0 fL   MCH 27.3  26.0 - 34.0 pg   MCHC 31.9  30.0 - 36.0 g/dL   RDW 20.6 (*) 11.5 - 15.5 %   Platelets 390  150 - 400 K/uL  RENAL FUNCTION PANEL     Status: Abnormal   Collection Time    10/17/13  7:58 AM      Result Value Ref Range   Sodium 138  137 - 147 mEq/L   Potassium 4.0  3.7 - 5.3 mEq/L   Chloride 90 (*) 96 - 112 mEq/L   CO2 26  19 - 32 mEq/L   Glucose, Bld 149 (*) 70 - 99 mg/dL   BUN 45 (*) 6 - 23 mg/dL   Creatinine, Ser 7.52 (*) 0.50 - 1.35 mg/dL   Calcium 9.7  8.4 - 10.5 mg/dL   Phosphorus 7.1 (*) 2.3 - 4.6 mg/dL   Albumin 2.3 (*) 3.5 - 5.2 g/dL   GFR calc non Af Amer 7 (*) >90 mL/min   GFR calc Af Amer 8 (*) >90 mL/min   Comment: (NOTE)      The eGFR has been calculated using the CKD EPI equation.     This calculation has not been validated in all clinical situations.     eGFR's persistently <90 mL/min signify possible Chronic Kidney     Disease.  GLUCOSE, CAPILLARY     Status: Abnormal   Collection Time    10/17/13 12:25 PM      Result Value Ref Range   Glucose-Capillary 155 (*) 70 - 99 mg/dL  GLUCOSE, CAPILLARY     Status: Abnormal   Collection Time    10/17/13  5:48 PM      Result Value Ref Range   Glucose-Capillary 164 (*) 70 - 99 mg/dL  GLUCOSE, CAPILLARY     Status: Abnormal   Collection Time    10/17/13  8:24 PM      Result Value Ref Range   Glucose-Capillary 158 (*) 70 - 99 mg/dL  GLUCOSE, CAPILLARY     Status: Abnormal   Collection Time    10/18/13 12:09 AM      Result Value Ref Range   Glucose-Capillary 122 (*) 70 - 99 mg/dL  COMPREHENSIVE METABOLIC PANEL     Status: Abnormal   Collection Time    10/18/13  4:05 AM      Result Value Ref Range   Sodium 136 (*) 137 - 147 mEq/L   Potassium 3.3 (*) 3.7 - 5.3 mEq/L   Comment: DELTA CHECK NOTED   Chloride 92 (*) 96 - 112 mEq/L   CO2 27  19 - 32 mEq/L   Glucose, Bld 155 (*) 70 - 99 mg/dL   BUN 24 (*) 6 - 23 mg/dL   Comment: DELTA CHECK NOTED   Creatinine, Ser 4.21 (*) 0.50 - 1.35 mg/dL   Comment: DELTA CHECK NOTED   Calcium 9.6  8.4 - 10.5 mg/dL   Total Protein 8.6 (*) 6.0 - 8.3 g/dL   Albumin 2.3 (*) 3.5 - 5.2 g/dL   AST 19  0 - 37 U/L   ALT 11  0 - 53 U/L   Alkaline Phosphatase 57  39 - 117 U/L   Total Bilirubin 0.4  0.3 - 1.2 mg/dL   GFR calc non Af Amer 14 (*) >  90 mL/min   GFR calc Af Amer 16 (*) >90 mL/min   Comment: (NOTE)     The eGFR has been calculated using the CKD EPI equation.     This calculation has not been validated in all clinical situations.     eGFR's persistently <90 mL/min signify possible Chronic Kidney     Disease.  CBC WITH DIFFERENTIAL     Status: Abnormal   Collection Time    10/18/13  4:05 AM      Result Value Ref Range    WBC 10.1  4.0 - 10.5 K/uL   RBC 3.49 (*) 4.22 - 5.81 MIL/uL   Hemoglobin 9.6 (*) 13.0 - 17.0 g/dL   HCT 30.4 (*) 39.0 - 52.0 %   MCV 87.1  78.0 - 100.0 fL   MCH 27.5  26.0 - 34.0 pg   MCHC 31.6  30.0 - 36.0 g/dL   RDW 20.7 (*) 11.5 - 15.5 %   Platelets 404 (*) 150 - 400 K/uL   Neutrophils Relative % 71  43 - 77 %   Neutro Abs 7.2  1.7 - 7.7 K/uL   Lymphocytes Relative 18  12 - 46 %   Lymphs Abs 1.8  0.7 - 4.0 K/uL   Monocytes Relative 9  3 - 12 %   Monocytes Absolute 1.0  0.1 - 1.0 K/uL   Eosinophils Relative 1  0 - 5 %   Eosinophils Absolute 0.1  0.0 - 0.7 K/uL   Basophils Relative 0  0 - 1 %   Basophils Absolute 0.0  0.0 - 0.1 K/uL  MAGNESIUM     Status: None   Collection Time    10/18/13  4:05 AM      Result Value Ref Range   Magnesium 2.2  1.5 - 2.5 mg/dL  GLUCOSE, CAPILLARY     Status: Abnormal   Collection Time    10/18/13  4:36 AM      Result Value Ref Range   Glucose-Capillary 163 (*) 70 - 99 mg/dL  GLUCOSE, CAPILLARY     Status: Abnormal   Collection Time    10/18/13  8:16 AM      Result Value Ref Range   Glucose-Capillary 142 (*) 70 - 99 mg/dL  GLUCOSE, CAPILLARY     Status: Abnormal   Collection Time    10/18/13  1:51 PM      Result Value Ref Range   Glucose-Capillary 154 (*) 70 - 99 mg/dL   Ct Head Wo Contrast  10/16/2013   CLINICAL DATA:  Altered mental status.  Abnormal EEG.  EXAM: CT HEAD WITHOUT CONTRAST  TECHNIQUE: Contiguous axial images were obtained from the base of the skull through the vertex without intravenous contrast.  COMPARISON:  10/01/2013  FINDINGS: Mild cerebral atrophy is unchanged. Periventricular white matter hypodensities do not appear significantly changed and are nonspecific but compatible with mild chronic small vessel ischemic disease. There is no evidence of acute cortical infarct, intracranial hemorrhage, mass, midline shift, or extra-axial fluid collection. Prior left cataract surgery is noted. The visualized mastoid air cells are  clear. Minimal maxillary sinus mucosal thickening is noted bilaterally. Thickening of the left maxillary sinus wall suggestive of chronic sinusitis.  IMPRESSION: 1. No evidence of acute intracranial abnormality. 2. Unchanged, mild cerebral atrophy and chronic small vessel ischemic disease.   Electronically Signed   By: Logan Bores   On: 10/16/2013 22:57    Assessment/Plan Altered mental status-needs MRI/MRA of brain tomorrow. Request for sedation  to complete exam. Use of moderate conscious sedation was discussed with the patient's wife.  Side effects and possible complications explained. Wife agrees to proceed and has consented.   Ascencion Dike PA-C 10/18/2013, 4:35 PM

## 2013-10-18 NOTE — Progress Notes (Signed)
NEURO HOSPITALIST PROGRESS NOTE   SUBJECTIVE:                                                                                                                        Patient is in bed and had just finished PT.  He is more vocal today--he will state "my legs hurt" and "stop it".  Will not open eyes to commands, follow commands.   OBJECTIVE:                                                                                                                           Vital signs in last 24 hours: Temp:  [97.5 F (36.4 C)-98.8 F (37.1 C)] 98.8 F (37.1 C) (03/25 1234) Pulse Rate:  [85-96] 85 (03/25 1234) Resp:  [17-19] 18 (03/25 1234) BP: (115-145)/(70-83) 145/73 mmHg (03/25 1234) SpO2:  [98 %-100 %] 99 % (03/25 1234)  Intake/Output from previous day: 03/24 0701 - 03/25 0700 In: 451 [I.V.:171; NG/GT:180; IV Piggyback:100] Out: 668  Intake/Output this shift: Total I/O In: 90 [NG/GT:90] Out: -  Nutritional status: NPO  Past Medical History  Diagnosis Date  . ESRD on hemodialysis 05/05/2007    ESRD due to DM/HTN. Started dialysis in November 2013.  HD TTS at [facility] on [address].  [name] BACK PAIN, LUMBAR, CHRONIC 08/06/2009  . BENIGN PROSTATIC HYPERTROPHY 08/01/2010  . CEREBROVASCULAR ACCIDENT, HX OF 08/06/2009  . CHOLELITHIASIS 08/01/2010  . CONGESTIVE HEART FAILURE 03/18/2009  . DEPRESSION 03/18/2009  . DIABETES MELLITUS, TYPE II 03/25/2007  . ERECTILE DYSFUNCTION 03/25/2007  . GERD 03/25/2007  . HEPATITIS C, HX OF 03/25/2007  . HYPERTENSION 03/25/2007  . Morbid obesity 03/25/2007  . NEPHROLITHIASIS, HX OF 03/18/2009  . Complication of anesthesia     wife states pt had trouble waking up with his last surgery in Nov., 2014     Neurologic Exam:  Mental Status:  Will not follow verbal commands, will state, "it hurts" when moving his legs, attempts to stick tongue out when asked.   Cranial Nerves:  II: Discs flat bilaterally;  no blink threat, pupils  equal, round, reactive to light and accommodation  III,IV, VI: ptosis not present, extra-ocular motions--eyes disconjugate but positive doll's and no left gaze  preference.  V,VII: face symmetric  VIII: hearing intact  IX,X: cough reflex present  XI: unable to assess  XII: midline tongue extension  Motor:  Moving bilateral UE spontaneously but not to command, will not grip hands, lifts bilateral legs  Sensory: winces to pain bilateral finger noxious stimuli and withdrew legs with noxious stimuli.  Deep Tendon Reflexes:  2+ in the UE no KJ --bilateral BKA  Plantars:  Bilateral BKA     Lab Results: Basic Metabolic Panel:  Recent Labs Lab 10/14/13 1659 10/15/13 0844 10/16/13 0540 10/17/13 0500 10/17/13 0758 10/18/13 0405  NA 143 138 136* 137 138 136*  K 4.8 4.7 4.3 4.0 4.0 3.3*  CL 100 91* 90* 89* 90* 92*  CO2 26 27 26 25 26 27   GLUCOSE 114* 109* 137* 145* 149* 155*  BUN 33* 19 32* 44* 45* 24*  CREATININE 7.01* 3.92* 5.61* 7.49* 7.52* 4.21*  CALCIUM 9.3 9.5 9.7 9.6 9.7 9.6  MG 2.4 2.2 2.3 2.5  --  2.2  PHOS 6.5*  --   --   --  7.1*  --     Liver Function Tests:  Recent Labs Lab 10/13/13 0500  10/15/13 0844 10/16/13 0540 10/17/13 0500 10/17/13 0758 10/18/13 0405  AST 26  --  31 21 20   --  19  ALT 13  --  15 11 10   --  11  ALKPHOS 59  --  66 58 55  --  57  BILITOT 0.5  --  0.6 0.5 0.4  --  0.4  PROT 7.8  --  9.1* 8.4* 8.3  --  8.6*  ALBUMIN 2.3*  < > 2.6* 2.4* 2.3* 2.3* 2.3*  < > = values in this interval not displayed. No results found for this basename: LIPASE, AMYLASE,  in the last 168 hours No results found for this basename: AMMONIA,  in the last 168 hours  CBC:  Recent Labs Lab 10/12/13 0500 10/13/13 0500 10/14/13 1659 10/15/13 0844 10/16/13 0540 10/17/13 0758 10/18/13 0405  WBC 7.6 9.8 8.8 9.0 9.2 8.7 10.1  NEUTROABS 5.4 6.1  --  6.2 5.9  --  7.2  HGB 9.2* 9.1* 8.6* 10.0* 9.3* 8.7* 9.6*  HCT 28.2* 28.5* 26.4* 30.8* 28.6* 27.3* 30.4*  MCV  83.9 85.8 85.2 85.8 85.6 85.6 87.1  PLT 411* 430* 461* 455* 405* 390 404*    Cardiac Enzymes: No results found for this basename: CKTOTAL, CKMB, CKMBINDEX, TROPONINI,  in the last 168 hours  Lipid Panel: No results found for this basename: CHOL, TRIG, HDL, CHOLHDL, VLDL, LDLCALC,  in the last 168 hours  CBG:  Recent Labs Lab 10/17/13 2024 10/18/13 0009 10/18/13 0436 10/18/13 0816 10/18/13 1351  GLUCAP 158* 122* 163* 142* 154*    Microbiology: Results for orders placed during the hospital encounter of 10/01/13  CULTURE, BLOOD (ROUTINE X 2)     Status: None   Collection Time    10/01/13 11:35 AM      Result Value Ref Range Status   Specimen Description BLOOD RIGHT WRIST   Final   Special Requests BOTTLES DRAWN AEROBIC ONLY 10CC   Final   Culture  Setup Time     Final   Value: 10/01/2013 19:00     Performed at [facility]   Culture     Final   Value: NO GROWTH 5 DAYS     Performed at [facility]   Report Status 10/07/2013 FINAL   Final  CULTURE, BLOOD (ROUTINE  X 2)     Status: None   Collection Time    10/01/13 12:01 PM      Result Value Ref Range Status   Specimen Description BLOOD RIGHT FOREARM   Final   Special Requests BOTTLES DRAWN AEROBIC AND ANAEROBIC 5CC   Final   Culture  Setup Time     Final   Value: 10/01/2013 18:59     Performed at [facility]   Culture     Final   Value: NO GROWTH 5 DAYS     Performed at [facility]   Report Status 10/07/2013 FINAL   Final  CSF CULTURE     Status: None   Collection Time    10/02/13 10:55 AM      Result Value Ref Range Status   Specimen Description CSF   Final   Special Requests Normal   Final   Gram Stain     Final   Value: FEW WBC PRESENT, PREDOMINANTLY MONONUCLEAR     NO ORGANISMS SEEN     Performed at [facility]   Culture     Final   Value: NO GROWTH 3 DAYS     Performed at [facility]   Report Status 10/06/2013 FINAL   Final  GRAM STAIN     Status:  None   Collection Time    10/02/13 10:55 AM      Result Value Ref Range Status   Specimen Description CSF   Final   Special Requests Normal   Final   Gram Stain     Final   Value: CYTOSPIN PREP     WBC PRESENT, PREDOMINANTLY MONONUCLEAR     NO ORGANISMS SEEN   Report Status 10/02/2013 FINAL   Final  MRSA PCR SCREENING     Status: None   Collection Time    10/02/13 11:18 PM      Result Value Ref Range Status   MRSA by PCR NEGATIVE  NEGATIVE Final   Comment:            The GeneXpert MRSA Assay (FDA     approved for NASAL specimens     only), is one component of a     comprehensive MRSA colonization     surveillance program. It is not     intended to diagnose MRSA     infection nor to guide or     monitor treatment for     MRSA infections.  CLOSTRIDIUM DIFFICILE BY PCR     Status: None   Collection Time    10/05/13  2:05 AM      Result Value Ref Range Status   C difficile by pcr NEGATIVE  NEGATIVE Final  CULTURE, BLOOD (ROUTINE X 2)     Status: None   Collection Time    10/05/13 10:42 PM      Result Value Ref Range Status   Specimen Description BLOOD RIGHT UPPER ARM   Final   Special Requests BOTTLES DRAWN AEROBIC AND ANAEROBIC 10CC   Final   Culture  Setup Time     Final   Value: 10/06/2013 03:56     Performed at [facility]   Culture     Final   Value: NO GROWTH 5 DAYS     Performed at [facility]   Report Status 10/12/2013 FINAL   Final  CULTURE, BLOOD (ROUTINE X 2)     Status: None   Collection Time  10/05/13 10:56 PM      Result Value Ref Range Status   Specimen Description BLOOD RIGHT FOREARM   Final   Special Requests BOTTLES DRAWN AEROBIC AND ANAEROBIC 10CC   Final   Culture  Setup Time     Final   Value: 10/06/2013 03:56     Performed at [facility]   Culture     Final   Value: NO GROWTH 5 DAYS     Performed at [facility]   Report Status 10/12/2013 FINAL   Final  CULTURE, BLOOD (ROUTINE X 2)     Status: None    Collection Time    10/14/13  3:45 PM      Result Value Ref Range Status   Specimen Description BLOOD RIGHT ARM   Final   Special Requests BOTTLES DRAWN AEROBIC ONLY 7 CC   Final   Culture  Setup Time     Final   Value: 10/14/2013 20:19     Performed at [facility]   Culture     Final   Value:        BLOOD CULTURE RECEIVED NO GROWTH TO DATE CULTURE WILL BE HELD FOR 5 DAYS BEFORE ISSUING A FINAL NEGATIVE REPORT     Performed at [facility]   Report Status PENDING   Incomplete  CULTURE, BLOOD (ROUTINE X 2)     Status: None   Collection Time    10/14/13  3:50 PM      Result Value Ref Range Status   Specimen Description BLOOD RIGHT HAND   Final   Special Requests BOTTLES DRAWN AEROBIC ONLY 5 CC   Final   Culture  Setup Time     Final   Value: 10/14/2013 20:19     Performed at [facility]   Culture     Final   Value:        BLOOD CULTURE RECEIVED NO GROWTH TO DATE CULTURE WILL BE HELD FOR 5 DAYS BEFORE ISSUING A FINAL NEGATIVE REPORT     Performed at [facility]   Report Status PENDING   Incomplete    Coagulation Studies: No results found for this basename: LABPROT, INR,  in the last 72 hours  Imaging: Ct Head Wo Contrast  10/16/2013   CLINICAL DATA:  Altered mental status.  Abnormal EEG.  EXAM: CT HEAD WITHOUT CONTRAST  TECHNIQUE: Contiguous axial images were obtained from the base of the skull through the vertex without intravenous contrast.  COMPARISON:  10/01/2013  FINDINGS: Mild cerebral atrophy is unchanged. Periventricular white matter hypodensities do not appear significantly changed and are nonspecific but compatible with mild chronic small vessel ischemic disease. There is no evidence of acute cortical infarct, intracranial hemorrhage, mass, midline shift, or extra-axial fluid collection. Prior left cataract surgery is noted. The visualized mastoid air cells are clear. Minimal maxillary sinus mucosal thickening is noted bilaterally. Thickening  of the left maxillary sinus wall suggestive of chronic sinusitis.  IMPRESSION: 1. No evidence of acute intracranial abnormality. 2. Unchanged, mild cerebral atrophy and chronic small vessel ischemic disease.   Electronically Signed   By: Logan Bores   On: 10/16/2013 22:57       MEDICATIONS  Scheduled: . bisacodyl  5 mg Oral BID  . darbepoetin (ARANESP) injection - DIALYSIS  25 mcg Intravenous Q Tue-HD  . feeding supplement (PRO-STAT SUGAR FREE 64)  30 mL Per Tube Q1200  . FLUoxetine  20 mg Per Tube Daily  . heparin  5,000 Units Subcutaneous 3 times per day  . insulin aspart  0-20 Units Subcutaneous 6 times per day  . labetalol  100 mg Oral BID  . multivitamin  1 tablet Oral QHS  . neomycin-bacitracin-polymyxin   Topical TID  . ondansetron (ZOFRAN) IV  4 mg Intravenous 3 times per day  . piperacillin-tazobactam (ZOSYN)  IV  2.25 g Intravenous 3 times per day  . sodium chloride  3 mL Intravenous Q12H  . vancomycin  1,000 mg Intravenous Q T,Th,Sa-HD    ASSESSMENT/PLAN:                                                                                                             63 YO male with confusion prior to hospitalization which seemed to rapidly deteriorate over period of 6 days. Work up this far has been negative. More vocal today stating" they hurt" when legs moved. No longer has a left gaze deviation--now eyes are disconjugate and doll's intact. EEG is negative for seizure. Will need to confirm stroke and exclude hemorrhage with MRI.   1) MRI brain w/o contrast (ordered)  2) further recommendations pending the above study.     Assessment and plan discussed with with attending physician and they are in agreement.    Etta Quill PA-C [facility] [phone]  10/18/2013, 2:41 PM

## 2013-10-18 NOTE — Progress Notes (Signed)
Physical Therapy Treatment Patient Details Name: Oscar Castillo MRN: [id number] DOB: 1951/07/16 Today's Date: 10/18/2013    History of Present Illness 63 y.o. male admitted to [facility] on 10/01/13 with AMS and FTT.  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 Of note, he has history of L BKA as well. Pt with significant PMHx of diabetes and severe peripheral vascular disease who also has end-stage renal disease and undergoes dialysis.  S/p revision right transtibial amputation 3/18    PT Comments    Patient soiled once again needing cleaning prior to tx.  Participated some when given clear instruction and increased time and hand over hand assist for rolling.  However, pt resistive to upright activity even after three attempts made for sitting edge of bed and twice to transfer to chair; patient continued to resist and complain; so assisted pt into comfortable position in bed and RN informed.  Will try with pre-medication next visit.   Follow Up Recommendations  SNF;Supervision/Assistance - 24 hour     Equipment Recommendations  None recommended by PT    Recommendations for Other Services       Precautions / Restrictions Precautions Precautions: Fall Precaution Comments: pt also with poor vision, wife reporting he has to have cataract surgery Restrictions Other Position/Activity Restrictions: bil BKA    Mobility  Bed Mobility Overal bed mobility: Needs Assistance Bed Mobility: Rolling Rolling: Mod assist;Max assist   Supine to sit: +2 for physical assistance;Max assist Sit to supine: Mod assist   General bed mobility comments: pt not initiating coming up to sit and when assisted to sit stated "stop, stop"  Transfers                 General transfer comment: attempted both sliding board and A-P transfers, but patient not participating and lying back down; RN informed may be due to pain  Ambulation/Gait                 Stairs             Wheelchair Mobility    Modified Rankin (Stroke Patients Only)       Balance Overall balance assessment: Needs assistance   Sitting balance-Leahy Scale: Poor Sitting balance - Comments: mod support given at times, pt able to balance close supervision for few moments as well; but determined to lie back down                            Cognition Arousal/Alertness: Awake/alert Behavior During Therapy: Agitated Overall Cognitive Status: Impaired/Different from baseline Area of Impairment: Following commands;Attention;Problem solving       Following Commands: Follows one step commands with increased time;Follows one step commands inconsistently     Problem Solving: Slow processing;Decreased initiation;Requires verbal cues;Requires tactile cues      Exercises Other Exercises Other Exercises: re-wrapped right BKA    General Comments        Pertinent Vitals/Pain Seems in pain with mobility; RN aware    Home Living                      Prior Function            PT Goals (current goals can now be found in the care plan section) Progress towards PT goals: Not progressing toward goals - comment (limited by participation/?pain)    Frequency  Min 2X/week    PT Plan Current  plan remains appropriate    End of Session   Activity Tolerance: Patient limited by pain Patient left: in bed;with call bell/phone within reach;with bed alarm set     Time: 1400-1438 PT Time Calculation (min): 38 min  Charges:  $Therapeutic Activity: 38-52 mins                    G Codes:      WYNN,CYNDI 17-Nov-2013, 4:30 PM Magda Kiel, [place] Nov 17, 2013

## 2013-10-18 NOTE — Progress Notes (Signed)
NUTRITION FOLLOW UP  Intervention:   Continue Nepro of 45 ml/hr and 30 ml Prostat once daily via tube.  Tube feeding regimen to provide 2044 kcals (100% of re-estimated kcal needs), 102 grams of protein (100% of re-estimated kcal needs), and 785 ml of free water.  Nutrition Dx:   Increased nutrient needs related to wound healing, ESRD as evidenced by estimated nutrition needs, Ongoing  Goal:   Pt to meet >/= 90% of their estimated nutrition needs, met  Monitor:   TF rate and tolerance, weight trends, labs, I/O's  Assessment:   63 y.o. 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  3/17-BSE completed by SLP on 3/10 with recommendations for Dysphagia 1 diet with thin liquids. Suspect continued inadequate intake 2/2 cognition. Team met with patient's wife, sister and brother-in-law who were in agreement to meeting with palliative care team. Meeting on 3/14 reveal wife desires pt to continue as Full Code until mental status improves.  RD saw patient during HD treatment. Pt able to answer simple yes and no questions. Continues on Dysphagia 1 diet with Nepro Shakes ordered BID. Pt states that he enjoys these supplements. Meal intake variable since admission, but overall <50%.  EDW per renal is 72.5 kg. Pt has required IVF this admit 2/2 poor po intake.  Potassium WNL, phos elevated at 5.7.  3/23- Pt with ongoing poor PO intake-Spouse requested tube feeding-NG placed. Pt is currently NPO. Pt with Hep C. Nutritional management was consulted for tube feeding adjustment and management.  3/23-Tube feeding was initiated yesterday. Current tube feeding, Nepro, is running at 30 ml/hr to provide 1296 kcals (68% of re-estimated minimum kcals needs), 58 grams of protein (58% of re-estimated minimum protein needs), and 523 ml of free water. Per RN, pt is tolerating  tube feeding with little to no residuals and no known plans for diet advancement. Sodium and potassium are WNL (3/23), phosphorous is elevated at 6.5 (3/21)  3/25-Current tube feeding, Nepro, is running at goal rate of 45 ml/hr. Per RN, pt has been tolerating the tube feeding with little to no residuals.   Sodium and potassium are low, phosphorous is elevated at 7.1.  Height: Ht Readings from Last 1 Encounters:  10/04/13 '4\' 8"'  (1.422 m)    Weight Status:   Wt Readings from Last 1 Encounters:  10/17/13 175 lb 0.7 oz (79.4 kg)  10/14/13 174 lb 10/13/13 186 lb 10/12/13 182 lb 10/11/13 182 lb 10/10/13 194 lb Admit wt(10/01/13)159 lb  Re-estimated needs:  Kcal: 1900-2100  Protein: 100-110 gm  Fluid: 1200 ml  BMI: 26.4 kg/m2 -- adjusted for bilateral BKA's  Skin: Stage II pressure ulcer on sacrum, lower leg, and incision on right leg  Diet Order: NPO   Intake/Output Summary (Last 24 hours) at 10/18/13 1703 Last data filed at 10/18/13 0800  Gross per 24 hour  Intake    441 ml  Output      0 ml  Net    441 ml    Last BM: 3/24-loose and large amount   Labs:   Recent Labs Lab 10/14/13 1659  10/16/13 0540 10/17/13 0500 10/17/13 0758 10/18/13 0405  NA 143  < > 136* 137 138 136*  K 4.8  < > 4.3 4.0 4.0 3.3*  CL 100  < > 90* 89* 90* 92*  CO2 26  < > 26  '25 26 27  ' BUN 33*  < > 32* 44* 45* 24*  CREATININE 7.01*  < > 5.61* 7.49* 7.52* 4.21*  CALCIUM 9.3  < > 9.7 9.6 9.7 9.6  MG 2.4  < > 2.3 2.5  --  2.2  PHOS 6.5*  --   --   --  7.1*  --   GLUCOSE 114*  < > 137* 145* 149* 155*  < > = values in this interval not displayed.  CBG (last 3)   Recent Labs  10/18/13 0436 10/18/13 0816 10/18/13 1351  GLUCAP 163* 142* 154*    Scheduled Meds: . bisacodyl  5 mg Oral BID  . darbepoetin (ARANESP) injection - DIALYSIS  25 mcg Intravenous Q Tue-HD  . feeding supplement (PRO-STAT SUGAR FREE 64)  30 mL Per Tube Q1200  . FLUoxetine  20 mg Per Tube Daily  . heparin  5,000  Units Subcutaneous 3 times per day  . insulin aspart  0-20 Units Subcutaneous 6 times per day  . labetalol  100 mg Oral BID  . multivitamin  1 tablet Oral QHS  . neomycin-bacitracin-polymyxin   Topical TID  . ondansetron (ZOFRAN) IV  4 mg Intravenous 3 times per day  . piperacillin-tazobactam (ZOSYN)  IV  2.25 g Intravenous 3 times per day  . sodium chloride  3 mL Intravenous Q12H  . vancomycin  1,000 mg Intravenous Q T,Th,Sa-HD    Continuous Infusions: . dextrose 5 % and 0.9% NaCl 10 mL/hr at 10/16/13 0009  . feeding supplement (NEPRO CARB STEADY) 1,000 mL (10/17/13 2045)    [place] Intern Pager: [phone]

## 2013-10-18 NOTE — Progress Notes (Signed)
Agree with dietetic intern note. Dawnmarie Breon Barnett RD, LDN Inpatient Clinical Dietitian Pager: [phone] After Hours Pager: [phone]  

## 2013-10-18 NOTE — Progress Notes (Addendum)
[facility] PROGRESS NOTE  Oscar Castillo ZOX:[id number] DOB: Jul 14, 1951 DOA: 10/01/2013 PCP: Oscar Cower, MD  Assessment/Plan:  Encephalopathy, metabolic  -Suspect dehydration and possible underlying infectious processes as etiology; 3/13 patient's cognition appears to be clearing. Patient eating and answering simple questions  -3/17 [name]/O. Oscar Castillo, very confused however patient S/P dialysis today   -3/20 A/O x0, very confused; did receive Ativan last night and attempt to obtain MRI  New LBBB  -has been transient in nature  -systolic function normal but septal motion with abnormal function and dyssynergy  -cardiac enzymes negative  -doubt pt appropriate for invasive cardiac work up   Dehydration /FTT (failure to thrive) in adult  -Continue IV fluid-type of fluid adjusted by nephrology today  - Speech and Language Pathologist (SLP) attempted 10/02/2013 the patient unable to arouse enough to eval but later re examined and now on D1 diet  -Patient has begun to eat his meals., However very cachectic continue ensure 3 times a day between meals.  -IV to [facility] except when giving meds -3/25 patient pulled his NG tube; will leave out an attempt to have patient more comfortable/less agitated in order to obtain MRI of head/right BKA stump     Fever  -Unclear etiology- suspect DH and atelectasis  -question of right lower lobe right but currently is not hypoxic  -He did have leukocytosis so continue empiric antibiotics as precaution  -Patient unable to contribute to history and given altered mentation and fever an LP was done to rule out possible meningitis-CSF studies pending-partial results with 35 of protein and 156 of glucose so not indicative of bacterial meningitis-CSF culture no growth  -Influenza PCR negative  -Medication reconciliation list that patient was on doxycycline prior to admission which was started on 09/29/2013 or suspected postoperative wound infection/possible osteomyelitis  and scheduled to go for 3 weeks  -Blood cultures no growth to date urinalysis unremarkable  -3/14 Right BKA stump shows abscess/osteomyelitis by MRI;; see results below  -3/17 patient remains afebrile;  -3/18 surgeon's S/P abscess drainage/revision of right BKA stump on Wednesday 3/18    HYPERTENSION  -Although blood pressure not within AHA guidelines will allow permissive HTN given patient's waxing and waning cognitive state, poor perfusion secondary to severe PVD   -Continue labetalol 100 mg BID  -Continue hydralazine 5 mg PRN SBP> 160 or DBP> 100  Anemia due to chronic illness  -Hemoglobin has trended down to 13 after hydration noting baseline just under 11  -3/17 Monitor closely currently at 9.5, repeat H./H.  -3/17 type and cross. With patient having revision in a.m. of right BKA will most likely need post surgical transfusion (would use hemoconcentrated PRBC)  DM (diabetes mellitus) type I controlled with renal manifestation  -On Lantus prior to admission -Patient has begun to eat at least partial to all portions of his meals. Being supplemented by Ensure between meals.    -Continue resistant SSI; CBG currently controlled on this regimen   CKD (chronic kidney disease) stage V requiring chronic dialysis  -Usual dialysis days Tuesday Thursday Saturday  -No ultrafiltration secondary to ongoing dehydration   Chronic combined systolic (EF 81%) and grade 2 diastolic congestive heart failure  -Compensated and managed with dialysis on T/Th/Sat  HEPATITIS C, HX OF   CEREBROVASCULAR ACCIDENT, HX OF -No evidence of acute infarct   Hyperlipidemia -lipid panel; w/i NCEP guidelines  Right BKA -Pain is increasing since surgery in February 2015. MRI 3/13 shows pathology see results below -3/14 MRI of right BKA  stump shows abscess/osteomyelitis see results below -3/15 stop much less tender today, negative discharge, fibrotic tissue along the skin closure. -3/16 right BKA stump more  tender today on exam. -3/17 patient's right BKA stump nontender to palpation there   -3/25 right BKA stump revision appropriately tender  Osteomyelitis right BKA stump -Continue  Zosyn + vancomycin -3/15 seen by Dr. Jean Castillo (orthopedic surgery), round discuss abnormal findings of MRI right BKA stump and decide on course of action on 3/16 (debridement, stump revision?) -Continue morphine 1-2 mg q. 4 hours PRN severe pain in right BKA stump. -Dr. Meridee Castillo (orthopedic surgeon) revised right BKA stump secondary abscess/osteomyelitis on 3/18    Code Status: Full Family Communication:  Spoke with wife discussion of plan of care  Disposition Plan: We will await recommendations from palliative care   Consultants: Dr. Roney Castillo Nephrology  Dr Oscar Castillo (Orthopedic Surgery) Dr. Jean Castillo (orthopedic surgery) Dr. Rhea Castillo (palliative care) Radiology for lumbar puncture   Procedures:  10/11/2013 revision of right BKA secondary to Osteomyelitis/Abscess  MRI tib-fib right BKA stump 10/06/2013 Findings consistent with a focal abscess at the tip of the stumps of  the distal tibia and fibula.  Subtle edema at the tip of the stumps of the tibia and fibula most  likely represent focal osteomyelitis because the fluid collections,  which probably represent pus, are immediately adjacent to the bones.  but there is no discrete bone destruction.  Probable adjacent myositis/cellulitis.   CT head without contrast 10/01/2013 1. There is no evidence of an acute intracranial hemorrhage nor of  an evolving ischemic infarction.  2. There is stable diffuse atrophy with evidence of chronic small  vessel ischemic change.  3. There is no intracranial mass effect.  4. There is no evidence of an acute skull fracture.   Lumbar puncture by interventional radiology   Antibiotics: Zosyn 3/14>> Vancomycin (full dose) 3/14>> Aztreonam 3/08 >>> stopped 3/14 Vancomycin 3/08  >>> stopped 3/14      HPI/Subjective: 63 year old male PMHx HTN, HLD, Hx diabetes type 1 controlled, vasculopath, S/P recent right BKA on 08/2013. He also has a prior history of CVA in 2009 and is on chronic hemodialysis. In addition he has combined systolic& diastolic heart failure with an EF of 45%. According to the patient's wife he had been doing well in the postoperative period until around the same time he followed up with Dr. Sharol Given. At that time he started with poor intake and gradually increasing altered mentation. 09/24/2013 he was very confused when not work with the therapist. On 09/26/2013 a meeting was called at the nursing facility because he was experiencing extreme symptoms of failure to thrive and they were concerned the patient was depressed. Palliative care team had apparently been meeting with the family prior to admission. By 09/29/2013 he was a little more coherent and began trying to eat but later in the morning he would not take his meds kept his mouth shut.  Postoperatively he has had 2 courses of antibiotics for concerns of possible stump infection. In regards to dietary intake: in addition to the above the patient has been on a dysphagia diet with pured foods begun after the patient was noted to be coughing with attempts to eat.  Upon presentation to the emergency department EMS reported that the wife found food in the patient's mouth and he was unresponsive. His CBG was 379. He was hypoxemic and placed on a nonrebreather mask. His hemoglobin at presentation was 16.7 which was  markedly elevated compared to prior readings between 9.9 and 11.5. He was also noted to have significant decreased breath sounds on the right without wheezing or crackles. The right BKA stump was unremarkable in appearance. Was also found to be mildly febrile with a rectal temperature of 101. Chest x-ray was concerning for possible right lower lobe pneumonia. 3/12 patient just returned from HD somewhat  lethargic, complained of right BKA stump pain which has been increasing over several days 3/13 patient interacting with medical staff appropriately, able to participate in dressing change of right BKA 3/14 patient alert speaking in full sentences, sitting in bed eating lunch. Writhing in pain and indicating pain is in his right BKA stump. 3/15 patient states the pain in his right BKA stump significantly decreased on the pain regimen he is on now. Negative N./V. states has been eating his meals today. States understands he has an infection in the right BKA stump and further surgery may be required. 3/16 patient able to answer some questions, and states understands the plan for right BKA stump revision.  Pain slightly increased over yesterday. 3/17 patient S/P HD, confused which is his baseline post HD 3/25 patient is lethargic however will follow some commands did cooperate with dressing change.   Objective: Filed Vitals:   10/17/13 2024 10/17/13 2313 10/18/13 0443 10/18/13 0817  BP: 134/82 130/79 123/77 133/83  Pulse: 96 95 86 87  Temp: 98.6 F (37 C)  97.5 F (36.4 C) 98.7 F (37.1 C)  TempSrc: Oral  Axillary Oral  Resp: 19  18 17   Height:      Weight:      SpO2: 100%  98% 99%    Intake/Output Summary (Last 24 hours) at 10/18/13 1227 Last data filed at 10/18/13 0800  Gross per 24 hour  Intake    541 ml  Output      0 ml  Net    541 ml   Filed Weights   10/16/13 2050 10/17/13 0748 10/17/13 1211  Weight: 74 kg (163 lb 2.3 oz) 80 kg (176 lb 5.9 oz) 79.4 kg (175 lb 0.7 oz)    Exam:   General:  A./O. x0 , follows some commands,    Cardiovascular: Regular rhythm and rate, negative murmurs rubs gallop  Respiratory: Clear to auscultation bilateral  Abdomen: Soft, nontender, nondistended plus bowel sound  Musculoskeletal: Bilateral BKA, left BKA performed November 2014 negative tenderness to palpation; right BKA performed February 2015/with revision 10/11/2013, appropriately  tenderness to palpation along the anterior/right lateral aspect of stump (patient on pain regimen currently) negative discharge from incision site Staples and sutures intact.      Data Reviewed: Basic Metabolic Panel:  Recent Labs Lab 10/14/13 1659 10/15/13 0844 10/16/13 0540 10/17/13 0500 10/17/13 0758 10/18/13 0405  NA 143 138 136* 137 138 136*  K 4.8 4.7 4.3 4.0 4.0 3.3*  CL 100 91* 90* 89* 90* 92*  CO2 26 27 26 25 26 27   GLUCOSE 114* 109* 137* 145* 149* 155*  BUN 33* 19 32* 44* 45* 24*  CREATININE 7.01* 3.92* 5.61* 7.49* 7.52* 4.21*  CALCIUM 9.3 9.5 9.7 9.6 9.7 9.6  MG 2.4 2.2 2.3 2.5  --  2.2  PHOS 6.5*  --   --   --  7.1*  --    Liver Function Tests:  Recent Labs Lab 10/13/13 0500  10/15/13 0844 10/16/13 0540 10/17/13 0500 10/17/13 0758 10/18/13 0405  AST 26  --  31 21 20   --  19  ALT 13  --  15 11 10   --  11  ALKPHOS 59  --  66 58 55  --  57  BILITOT 0.5  --  0.6 0.5 0.4  --  0.4  PROT 7.8  --  9.1* 8.4* 8.3  --  8.6*  ALBUMIN 2.3*  < > 2.6* 2.4* 2.3* 2.3* 2.3*  < > = values in this interval not displayed. No results found for this basename: LIPASE, AMYLASE,  in the last 168 hours No results found for this basename: AMMONIA,  in the last 168 hours CBC:  Recent Labs Lab 10/12/13 0500 10/13/13 0500 10/14/13 1659 10/15/13 0844 10/16/13 0540 10/17/13 0758 10/18/13 0405  WBC 7.6 9.8 8.8 9.0 9.2 8.7 10.1  NEUTROABS 5.4 6.1  --  6.2 5.9  --  7.2  HGB 9.2* 9.1* 8.6* 10.0* 9.3* 8.7* 9.6*  HCT 28.2* 28.5* 26.4* 30.8* 28.6* 27.3* 30.4*  MCV 83.9 85.8 85.2 85.8 85.6 85.6 87.1  PLT 411* 430* 461* 455* 405* 390 404*   Cardiac Enzymes: No results found for this basename: CKTOTAL, CKMB, CKMBINDEX, TROPONINI,  in the last 168 hours BNP (last 3 results)  Recent Labs  10/01/13 1135  PROBNP 1397.0*   CBG:  Recent Labs Lab 10/16/13 2053 10/16/13 2336 10/17/13 0500 10/17/13 1225 10/17/13 1748  GLUCAP 131* 138* 149* 155* 164*    Recent Results  (from the past 240 hour(s))  CULTURE, BLOOD (ROUTINE X 2)     Status: None   Collection Time    10/14/13  3:45 PM      Result Value Ref Range Status   Specimen Description BLOOD RIGHT ARM   Final   Special Requests BOTTLES DRAWN AEROBIC ONLY 7 CC   Final   Culture  Setup Time     Final   Value: 10/14/2013 20:19     Performed at [facility]   Culture     Final   Value:        BLOOD CULTURE RECEIVED NO GROWTH TO DATE CULTURE WILL BE HELD FOR 5 DAYS BEFORE ISSUING A FINAL NEGATIVE REPORT     Performed at [facility]   Report Status PENDING   Incomplete  CULTURE, BLOOD (ROUTINE X 2)     Status: None   Collection Time    10/14/13  3:50 PM      Result Value Ref Range Status   Specimen Description BLOOD RIGHT HAND   Final   Special Requests BOTTLES DRAWN AEROBIC ONLY 5 CC   Final   Culture  Setup Time     Final   Value: 10/14/2013 20:19     Performed at [facility]   Culture     Final   Value:        BLOOD CULTURE RECEIVED NO GROWTH TO DATE CULTURE WILL BE HELD FOR 5 DAYS BEFORE ISSUING A FINAL NEGATIVE REPORT     Performed at [facility]   Report Status PENDING   Incomplete     Studies: Ct Head Wo Contrast  10/16/2013   CLINICAL DATA:  Altered mental status.  Abnormal EEG.  EXAM: CT HEAD WITHOUT CONTRAST  TECHNIQUE: Contiguous axial images were obtained from the base of the skull through the vertex without intravenous contrast.  COMPARISON:  10/01/2013  FINDINGS: Mild cerebral atrophy is unchanged. Periventricular white matter hypodensities do not appear significantly changed and are nonspecific but compatible with mild chronic small vessel ischemic disease. There is no evidence of  acute cortical infarct, intracranial hemorrhage, mass, midline shift, or extra-axial fluid collection. Prior left cataract surgery is noted. The visualized mastoid air cells are clear. Minimal maxillary sinus mucosal thickening is noted bilaterally. Thickening of the left  maxillary sinus wall suggestive of chronic sinusitis.  IMPRESSION: 1. No evidence of acute intracranial abnormality. 2. Unchanged, mild cerebral atrophy and chronic small vessel ischemic disease.   Electronically Signed   By: Logan Bores   On: 10/16/2013 22:57    Scheduled Meds: . bisacodyl  5 mg Oral BID  . darbepoetin (ARANESP) injection - DIALYSIS  25 mcg Intravenous Q Tue-HD  . feeding supplement (PRO-STAT SUGAR FREE 64)  30 mL Per Tube Q1200  . FLUoxetine  20 mg Per Tube Daily  . heparin  5,000 Units Subcutaneous 3 times per day  . insulin aspart  0-20 Units Subcutaneous 6 times per day  . labetalol  100 mg Oral BID  . multivitamin  1 tablet Oral QHS  . neomycin-bacitracin-polymyxin   Topical TID  . ondansetron (ZOFRAN) IV  4 mg Intravenous 3 times per day  . piperacillin-tazobactam (ZOSYN)  IV  2.25 g Intravenous 3 times per day  . sodium chloride  3 mL Intravenous Q12H  . vancomycin  1,000 mg Intravenous Q T,Th,Sa-HD   Continuous Infusions: . dextrose 5 % and 0.9% NaCl 10 mL/hr at 10/16/13 0009  . feeding supplement (NEPRO CARB STEADY) 1,000 mL (10/17/13 2045)    Active Problems:   HYPERTENSION   HEPATITIS C, HX OF   CEREBROVASCULAR ACCIDENT, HX OF   BENIGN PROSTATIC HYPERTROPHY   Hyperlipidemia   Anemia due to chronic illness   DM (diabetes mellitus) type I controlled with renal manifestation   Chronic combined systolic (EF 72%) and grade 2diastolic congestive heart failure   FTT (failure to thrive) in adult   Encephalopathy, metabolic   Dehydration   Fever    Time spent:40 minutes    WOODS, [place], J  [facility] Pager [phone]. If 7PM-7AM, please contact night-coverage at [email or web address], password [facility] 10/18/2013, 12:27 PM  LOS: 17 days

## 2013-10-18 NOTE — Progress Notes (Addendum)
Patient ID: Oscar Castillo, male   DOB: Dec 24, 1950, 63 y.o.   MRN: [id number] Patient had  healthy viable tissue at the time of the right transtibial amputation revision.   MRI scan of the right transtibial amputation would most likely show edema secondary to surgery and would be of low diagnostic value.  Patient has refused to eat by clenching his teeth. By report from his wife he was not eating several weeks before admission. This is a conscious decision by the patient.  Patient is much more alert this morning and seems to be close to his baseline status. I feel the patient will continue to improve with nutritional support only.

## 2013-10-19 ENCOUNTER — Inpatient Hospital Stay (HOSPITAL_COMMUNITY): Payer: Medicare Other

## 2013-10-19 DIAGNOSIS — D62 Acute posthemorrhagic anemia: Secondary | ICD-10-CM

## 2013-10-19 LAB — COMPREHENSIVE METABOLIC PANEL
ALT: 10 U/L (ref 0–53)
AST: 22 U/L (ref 0–37)
Albumin: 2.3 g/dL — ABNORMAL LOW (ref 3.5–5.2)
Alkaline Phosphatase: 58 U/L (ref 39–117)
BUN: 34 mg/dL — ABNORMAL HIGH (ref 6–23)
CO2: 25 mEq/L (ref 19–32)
Calcium: 10 mg/dL (ref 8.4–10.5)
Chloride: 92 mEq/L — ABNORMAL LOW (ref 96–112)
Creatinine, Ser: 6.19 mg/dL — ABNORMAL HIGH (ref 0.50–1.35)
GFR calc Af Amer: 10 mL/min — ABNORMAL LOW (ref >90)
GFR calc non Af Amer: 9 mL/min — ABNORMAL LOW (ref >90)
Glucose, Bld: 105 mg/dL — ABNORMAL HIGH (ref 70–99)
Potassium: 3.7 mEq/L (ref 3.7–5.3)
SODIUM: 139 meq/L (ref 137–147)
TOTAL PROTEIN: 8.7 g/dL — AB (ref 6.0–8.3)
Total Bilirubin: 0.5 mg/dL (ref 0.3–1.2)

## 2013-10-19 LAB — CBC WITH DIFFERENTIAL/PLATELET
Basophils Absolute: 0 10*3/uL (ref 0.0–0.1)
Basophils Relative: 0 % (ref 0–1)
EOS PCT: 3 % (ref 0–5)
Eosinophils Absolute: 0.3 10*3/uL (ref 0.0–0.7)
HCT: 29.9 % — ABNORMAL LOW (ref 39.0–52.0)
Hemoglobin: 9.7 g/dL — ABNORMAL LOW (ref 13.0–17.0)
LYMPHS PCT: 18 % (ref 12–46)
Lymphs Abs: 1.7 10*3/uL (ref 0.7–4.0)
MCH: 28 pg (ref 26.0–34.0)
MCHC: 32.4 g/dL (ref 30.0–36.0)
MCV: 86.2 fL (ref 78.0–100.0)
MONO ABS: 0.8 10*3/uL (ref 0.1–1.0)
Monocytes Relative: 8 % (ref 3–12)
Neutro Abs: 6.8 10*3/uL (ref 1.7–7.7)
Neutrophils Relative %: 70 % (ref 43–77)
Platelets: 409 10*3/uL — ABNORMAL HIGH (ref 150–400)
RBC: 3.47 MIL/uL — ABNORMAL LOW (ref 4.22–5.81)
RDW: 20.6 % — AB (ref 11.5–15.5)
WBC: 9.6 10*3/uL (ref 4.0–10.5)

## 2013-10-19 LAB — PHOSPHORUS: Phosphorus: <0.5 mg/dL — CL (ref 2.3–4.6)

## 2013-10-19 LAB — GLUCOSE, CAPILLARY
GLUCOSE-CAPILLARY: 124 mg/dL — AB (ref 70–99)
GLUCOSE-CAPILLARY: 155 mg/dL — AB (ref 70–99)
Glucose-Capillary: 96 mg/dL (ref 70–99)
Glucose-Capillary: 116 mg/dL — ABNORMAL HIGH (ref 70–99)
Glucose-Capillary: 117 mg/dL — ABNORMAL HIGH (ref 70–99)

## 2013-10-19 LAB — MAGNESIUM: Magnesium: 2.4 mg/dL (ref 1.5–2.5)

## 2013-10-19 MED ORDER — SODIUM PHOSPHATE 3 MMOLE/ML IV SOLN
18.0000 mmol | Freq: Once | INTRAVENOUS | Status: AC
  Administered 2013-10-19: 18 mmol via INTRAVENOUS
  Filled 2013-10-19: qty 6

## 2013-10-19 MED ORDER — FENTANYL CITRATE 0.05 MG/ML IJ SOLN: 25.0000 ug | INTRAMUSCULAR | Status: DC | PRN

## 2013-10-19 MED ORDER — HEPARIN SODIUM (PORCINE) 1000 UNIT/ML DIALYSIS: 1000.0000 [IU] | INTRAMUSCULAR | Status: DC | PRN

## 2013-10-19 MED ORDER — HEPARIN SODIUM (PORCINE) 1000 UNIT/ML DIALYSIS: 20.0000 [IU]/kg | INTRAMUSCULAR | Status: DC | PRN

## 2013-10-19 MED ORDER — FENTANYL CITRATE 0.05 MG/ML IJ SOLN
INTRAMUSCULAR | Status: AC | PRN
  Administered 2013-10-19 (×7): 25 ug via INTRAVENOUS

## 2013-10-19 MED ORDER — MIDAZOLAM HCL 2 MG/2ML IJ SOLN
INTRAMUSCULAR | Status: AC
  Filled 2013-10-19: qty 10

## 2013-10-19 MED ORDER — SODIUM CHLORIDE 0.9 % IV SOLN: 100.0000 mL | INTRAVENOUS | Status: DC | PRN

## 2013-10-19 MED ORDER — NEPRO/CARBSTEADY PO LIQD: 237.0000 mL | ORAL | Status: DC | PRN

## 2013-10-19 MED ORDER — LIDOCAINE-PRILOCAINE 2.5-2.5 % EX CREA: 1.0000 "application " | TOPICAL_CREAM | CUTANEOUS | Status: DC | PRN

## 2013-10-19 MED ORDER — PENTAFLUOROPROP-TETRAFLUOROETH EX AERO: 1.0000 "application " | INHALATION_SPRAY | CUTANEOUS | Status: DC | PRN

## 2013-10-19 MED ORDER — FENTANYL CITRATE 0.05 MG/ML IJ SOLN
INTRAMUSCULAR | Status: AC
  Administered 2013-10-19: 50 ug
  Filled 2013-10-19: qty 4

## 2013-10-19 MED ORDER — LIDOCAINE HCL (PF) 1 % IJ SOLN: 5.0000 mL | INTRAMUSCULAR | Status: DC | PRN

## 2013-10-19 MED ORDER — ALTEPLASE 2 MG IJ SOLR
2.0000 mg | Freq: Once | INTRAMUSCULAR | Status: DC | PRN
  Filled 2013-10-19: qty 2

## 2013-10-19 MED ORDER — MIDAZOLAM HCL 2 MG/2ML IJ SOLN
INTRAMUSCULAR | Status: AC | PRN
  Administered 2013-10-19: 2 mg via INTRAVENOUS
  Administered 2013-10-19: 1 mg via INTRAVENOUS
  Administered 2013-10-19: 2 mg via INTRAVENOUS
  Administered 2013-10-19 (×3): 1 mg via INTRAVENOUS
  Administered 2013-10-19: 2 mg via INTRAVENOUS

## 2013-10-19 MED ORDER — MIDAZOLAM HCL 2 MG/2ML IJ SOLN
1.0000 mg | INTRAMUSCULAR | Status: DC | PRN
  Filled 2013-10-19: qty 10

## 2013-10-19 NOTE — Progress Notes (Signed)
OT Cancellation Note  Patient Details Name: Oscar Castillo MRN: [id number] DOB: 03-09-1951   Cancelled Treatment:    Reason Eval/Treat Not Completed: Patient at procedure or test/ unavailable. Pt back from HD, but now off unit for MRI; will re attempt tomorrow  [name] 10/19/2013, 2:30 PM

## 2013-10-19 NOTE — Progress Notes (Signed)
OT Cancellation Note  Patient Details Name: Oscar Castillo MRN: [id number] DOB: [birth year]   Cancelled Treatment:    Reason Eval/Treat Not Completed: Patient at procedure or test/ unavailable. Pt in hemodialysis, will re attempt later today as time allows/as appropriate  [name] 10/19/2013, 9:55 AM

## 2013-10-19 NOTE — Procedures (Signed)
I was present at this dialysis session, have reviewed the session itself and made  appropriate changes  Kelly Splinter MD (pgr) [phone]    ([phone] 10/19/2013, 11:21 AM

## 2013-10-19 NOTE — Progress Notes (Signed)
[facility] PROGRESS NOTE  Oscar Castillo EXB:[id number] DOB: 02-19-1951 DOA: 10/01/2013 PCP: Oscar Cower, MD  Assessment/Plan:  Encephalopathy, metabolic  -Suspect dehydration and possible underlying infectious processes as etiology; 3/13 patient's cognition appears to be clearing. Patient eating and answering simple questions  -3/17 [name]/O. Oscar Castillo, very confused however patient S/P dialysis today   -3/20 A/O x0, very confused; did receive Ativan last night and attempt to obtain MRI -3/20 A/O. Just returned from MRI  New LBBB  -has been transient in nature  -systolic function normal but septal motion with abnormal function and dyssynergy  -cardiac enzymes negative  -doubt pt appropriate for invasive cardiac work up   Dehydration /FTT (failure to thrive) in adult  -Continue IV fluid-type of fluid adjusted by nephrology today  - Speech and Language Pathologist (SLP) attempted 10/02/2013 the patient unable to arouse enough to eval but later re examined and now on D1 diet  -Patient has begun to eat his meals., However very cachectic continue ensure 3 times a day between meals.  -IV to [facility] except when giving meds -3/25 patient pulled his NG tube; will leave out an attempt to have patient more comfortable/less agitated in order to obtain MRI of head/right BKA stump  -3/26 replace NG tube resumed feedings    Fever  -Unclear etiology- suspect DH and atelectasis  -question of right lower lobe right but currently is not hypoxic  -He did have leukocytosis so continue empiric antibiotics as precaution  -Patient unable to contribute to history and given altered mentation and fever an LP was done to rule out possible meningitis-CSF studies pending-partial results with 35 of protein and 156 of glucose so not indicative of bacterial meningitis-CSF culture no growth  -Influenza PCR negative  -Medication reconciliation list that patient was on doxycycline prior to admission which was started on  09/29/2013 or suspected postoperative wound infection/possible osteomyelitis and scheduled to go for 3 weeks  -Blood cultures no growth to date urinalysis unremarkable  -3/14 Right BKA stump shows abscess/osteomyelitis by MRI;; see results below  -3/17 patient remains afebrile;  -3/18 surgeon's S/P abscess drainage/revision of right BKA stump on Wednesday 3/18    HYPERTENSION  -Although blood pressure not within AHA guidelines will allow permissive HTN given patient's waxing and waning cognitive state, poor perfusion secondary to severe PVD   -Continue labetalol 100 mg BID  -Continue hydralazine 5 mg PRN SBP> 160 or DBP> 100  Anemia due to chronic illness  -Hemoglobin has trended down to 13 after hydration noting baseline just under 11  -3/17 Monitor closely currently at 9.5, repeat H./H.  -3/17 type and cross. With patient having revision in a.m. of right BKA will most likely need post surgical transfusion (would use hemoconcentrated PRBC) -3/26 stable  DM (diabetes mellitus) type I controlled with renal manifestation  -On Lantus prior to admission -Patient has begun to eat at least partial to all portions of his meals. Being supplemented by Ensure between meals.    -Continue resistant SSI; CBG currently controlled on this regimen   CKD (chronic kidney disease) stage V requiring chronic dialysis  -Usual dialysis days Tuesday Thursday Saturday  -No ultrafiltration secondary to ongoing dehydration   Chronic combined systolic (EF 10%) and grade 2 diastolic congestive heart failure  -Compensated and managed with dialysis on T/Th/Sat  HEPATITIS C, HX OF   CEREBROVASCULAR ACCIDENT, HX OF -No evidence of acute infarct   Hyperlipidemia -lipid panel; w/i NCEP guidelines  Right BKA -Pain is increasing since surgery in  February 2015. MRI 3/13 shows pathology see results below -3/14 MRI of right BKA stump shows abscess/osteomyelitis see results below -3/15 stop much less tender today,  negative discharge, fibrotic tissue along the skin closure. -3/16 right BKA stump more tender today on exam. -3/17 patient's right BKA stump nontender to palpation there   -3/25 right BKA stump revision appropriately tender  Osteomyelitis right BKA stump -Continue  Zosyn + vancomycin -3/15 seen by Dr. Jean Rosenthal (orthopedic surgery), round discuss abnormal findings of MRI right BKA stump and decide on course of action on 3/16 (debridement, stump revision?) -Continue morphine 1-2 mg q. 4 hours PRN severe pain in right BKA stump. -Dr. Meridee Score (orthopedic surgeon) revised right BKA stump secondary abscess/osteomyelitis on 3/18    Code Status: Full Family Communication:  Spoke with wife discussion of plan of care  Disposition Plan: We will await recommendations from palliative care   Consultants: Dr. Roney Jaffe Nephrology  Dr Meridee Score (Orthopedic Surgery) Dr. Jean Rosenthal (orthopedic surgery) Dr. Rhea Pink (palliative care) Radiology for lumbar puncture   Procedures:  10/11/2013 revision of right BKA secondary to Osteomyelitis/Abscess  MRI tib-fib right BKA stump 10/06/2013 Findings consistent with a focal abscess at the tip of the stumps of  the distal tibia and fibula.  Subtle edema at the tip of the stumps of the tibia and fibula most  likely represent focal osteomyelitis because the fluid collections,  which probably represent pus, are immediately adjacent to the bones.  but there is no discrete bone destruction.  Probable adjacent myositis/cellulitis.   CT head without contrast 10/01/2013 1. There is no evidence of an acute intracranial hemorrhage nor of  an evolving ischemic infarction.  2. There is stable diffuse atrophy with evidence of chronic small  vessel ischemic change.  3. There is no intracranial mass effect.  4. There is no evidence of an acute skull fracture.   Lumbar puncture by interventional radiology   Antibiotics: Zosyn  3/14>> Vancomycin (full dose) 3/14>> Aztreonam 3/08 >>> stopped 3/14 Vancomycin 3/08 >>> stopped 3/14      HPI/Subjective: 63 year old male PMHx HTN, HLD, Hx diabetes type 1 controlled, vasculopath, S/P recent right BKA on 08/2013. He also has a prior history of CVA in 2009 and is on chronic hemodialysis. In addition he has combined systolic& diastolic heart failure with an EF of 45%. According to the patient's wife he had been doing well in the postoperative period until around the same time he followed up with Dr. Sharol Given. At that time he started with poor intake and gradually increasing altered mentation. 09/24/2013 he was very confused when not work with the therapist. On 09/26/2013 a meeting was called at the nursing facility because he was experiencing extreme symptoms of failure to thrive and they were concerned the patient was depressed. Palliative care team had apparently been meeting with the family prior to admission. By 09/29/2013 he was a little more coherent and began trying to eat but later in the morning he would not take his meds kept his mouth shut.  Postoperatively he has had 2 courses of antibiotics for concerns of possible stump infection. In regards to dietary intake: in addition to the above the patient has been on a dysphagia diet with pured foods begun after the patient was noted to be coughing with attempts to eat.  Upon presentation to the emergency department EMS reported that the wife found food in the patient's mouth and he was unresponsive. His CBG was 379. He was hypoxemic  and placed on a nonrebreather mask. His hemoglobin at presentation was 16.7 which was markedly elevated compared to prior readings between 9.9 and 11.5. He was also noted to have significant decreased breath sounds on the right without wheezing or crackles. The right BKA stump was unremarkable in appearance. Was also found to be mildly febrile with a rectal temperature of 101. Chest x-ray was concerning for  possible right lower lobe pneumonia. 3/12 patient just returned from HD somewhat lethargic, complained of right BKA stump pain which has been increasing over several days 3/13 patient interacting with medical staff appropriately, able to participate in dressing change of right BKA 3/14 patient alert speaking in full sentences, sitting in bed eating lunch. Writhing in pain and indicating pain is in his right BKA stump. 3/15 patient states the pain in his right BKA stump significantly decreased on the pain regimen he is on now. Negative N./V. states has been eating his meals today. States understands he has an infection in the right BKA stump and further surgery may be required. 3/16 patient able to answer some questions, and states understands the plan for right BKA stump revision.  Pain slightly increased over yesterday. 3/17 patient S/P HD, confused which is his baseline post HD 3/25 patient is lethargic however will follow some commands did cooperate with dressing change.3/26 patient arousable with extreme stimuli, stated negative SOB/CP NOTE; patient just returning from MRI, received some sedation   Objective: Filed Vitals:   10/19/13 1555 10/19/13 1600 10/19/13 1606 10/19/13 1612  BP: 150/91 150/97 157/96 152/97  Pulse: 94 94 95 94  Temp:      TempSrc:      Resp: 13 11 15 17   Height:      Weight:      SpO2: 100% 100% 100% 99%    Intake/Output Summary (Last 24 hours) at 10/19/13 1640 Last data filed at 10/19/13 1200  Gross per 24 hour  Intake    460 ml  Output   1028 ml  Net   -568 ml   Filed Weights   10/18/13 2245 10/19/13 0800 10/19/13 1200  Weight: 73 kg (160 lb 15 oz) 77 kg (169 lb 12.1 oz) 76 kg (167 lb 8.8 oz)    Exam:   General:  A./O. x0 , does not follow commands,    Cardiovascular: Regular rhythm and rate, negative murmurs rubs gallop  Respiratory: Clear to auscultation bilateral  Abdomen: Soft, nontender, nondistended plus bowel sound  Musculoskeletal: Bilateral  BKA, left BKA performed November 2014 negative tenderness to palpation; right BKA performed February 2015/with revision 10/11/2013, appropriately tenderness to palpation along the anterior/right lateral aspect of stump (patient on pain regimen currently) negative discharge from incision site Staples and sutures intact.      Data Reviewed: Basic Metabolic Panel:  Recent Labs Lab 10/14/13 1659 10/15/13 0844 10/16/13 0540 10/17/13 0500 10/17/13 0758 10/18/13 0405 10/19/13 0546 10/19/13 1027  NA 143 138 136* 137 138 136* 139  --   K 4.8 4.7 4.3 4.0 4.0 3.3* 3.7  --   CL 100 91* 90* 89* 90* 92* 92*  --   CO2 26 27 26 25 26 27 25   --   GLUCOSE 114* 109* 137* 145* 149* 155* 105*  --   BUN 33* 19 32* 44* 45* 24* 34*  --   CREATININE 7.01* 3.92* 5.61* 7.49* 7.52* 4.21* 6.19*  --   CALCIUM 9.3 9.5 9.7 9.6 9.7 9.6 10.0  --   MG 2.4 2.2 2.3 2.5  --  2.2 2.4  --   PHOS 6.5*  --   --   --  7.1*  --   --  <0.5*   Liver Function Tests:  Recent Labs Lab 10/15/13 0844 10/16/13 0540 10/17/13 0500 10/17/13 0758 10/18/13 0405 10/19/13 0546  AST 31 21 20   --  19 22  ALT 15 11 10   --  11 10  ALKPHOS 66 58 55  --  57 58  BILITOT 0.6 0.5 0.4  --  0.4 0.5  PROT 9.1* 8.4* 8.3  --  8.6* 8.7*  ALBUMIN 2.6* 2.4* 2.3* 2.3* 2.3* 2.3*   No results found for this basename: LIPASE, AMYLASE,  in the last 168 hours No results found for this basename: AMMONIA,  in the last 168 hours CBC:  Recent Labs Lab 10/13/13 0500  10/15/13 0844 10/16/13 0540 10/17/13 0758 10/18/13 0405 10/19/13 0546  WBC 9.8  < > 9.0 9.2 8.7 10.1 9.6  NEUTROABS 6.1  --  6.2 5.9  --  7.2 6.8  HGB 9.1*  < > 10.0* 9.3* 8.7* 9.6* 9.7*  HCT 28.5*  < > 30.8* 28.6* 27.3* 30.4* 29.9*  MCV 85.8  < > 85.8 85.6 85.6 87.1 86.2  PLT 430*  < > 455* 405* 390 404* 409*  < > = values in this interval not displayed. Cardiac Enzymes: No results found for this basename: CKTOTAL, CKMB, CKMBINDEX, TROPONINI,  in the last 168 hours BNP  (last 3 results)  Recent Labs  10/01/13 1135  PROBNP 1397.0*   CBG:  Recent Labs Lab 10/18/13 1722 10/18/13 2011 10/18/13 2343 10/19/13 0434 10/19/13 0734  GLUCAP 108* 109* 124* 96 116*    Recent Results (from the past 240 hour(s))  CULTURE, BLOOD (ROUTINE X 2)     Status: None   Collection Time    10/14/13  3:45 PM      Result Value Ref Range Status   Specimen Description BLOOD RIGHT ARM   Final   Special Requests BOTTLES DRAWN AEROBIC ONLY 7 CC   Final   Culture  Setup Time     Final   Value: 10/14/2013 20:19     Performed at [facility]   Culture     Final   Value:        BLOOD CULTURE RECEIVED NO GROWTH TO DATE CULTURE WILL BE HELD FOR 5 DAYS BEFORE ISSUING A FINAL NEGATIVE REPORT     Performed at [facility]   Report Status PENDING   Incomplete  CULTURE, BLOOD (ROUTINE X 2)     Status: None   Collection Time    10/14/13  3:50 PM      Result Value Ref Range Status   Specimen Description BLOOD RIGHT HAND   Final   Special Requests BOTTLES DRAWN AEROBIC ONLY 5 CC   Final   Culture  Setup Time     Final   Value: 10/14/2013 20:19     Performed at [facility]   Culture     Final   Value:        BLOOD CULTURE RECEIVED NO GROWTH TO DATE CULTURE WILL BE HELD FOR 5 DAYS BEFORE ISSUING A FINAL NEGATIVE REPORT     Performed at [facility]   Report Status PENDING   Incomplete     Studies: No results found.  Scheduled Meds: . bisacodyl  5 mg Oral BID  . darbepoetin (ARANESP) injection - DIALYSIS  25 mcg Intravenous Q Tue-HD  .  feeding supplement (PRO-STAT SUGAR FREE 64)  30 mL Per Tube Q1200  . FLUoxetine  20 mg Per Tube Daily  . heparin  5,000 Units Subcutaneous 3 times per day  . insulin aspart  0-20 Units Subcutaneous 6 times per day  . labetalol  100 mg Oral BID  . midazolam      . multivitamin  1 tablet Oral QHS  . neomycin-bacitracin-polymyxin   Topical TID  . ondansetron (ZOFRAN) IV  4 mg Intravenous 3 times per day  .  piperacillin-tazobactam (ZOSYN)  IV  2.25 g Intravenous 3 times per day  . sodium chloride  3 mL Intravenous Q12H  . sodium phosphate  Dextrose 5% IVPB  18 mmol Intravenous Once  . vancomycin  1,000 mg Intravenous Q T,Th,Sa-HD   Continuous Infusions: . dextrose 5 % and 0.9% NaCl 10 mL/hr at 10/16/13 0009  . feeding supplement (NEPRO CARB STEADY) Stopped (10/18/13 1100)    Active Problems:   HYPERTENSION   HEPATITIS C, HX OF   CEREBROVASCULAR ACCIDENT, HX OF   BENIGN PROSTATIC HYPERTROPHY   Hyperlipidemia   Anemia due to chronic illness   DM (diabetes mellitus) type I controlled with renal manifestation   Chronic combined systolic (EF [id number]) and grade 2diastolic congestive heart failure   FTT (failure to thrive) in adult   Encephalopathy, metabolic   Dehydration   Fever    Time spent:40 minutes    Danessa Mensch, [place], J  [facility] Pager [phone]. If 7PM-7AM, please contact night-coverage at [email or web address], password [facility] 10/19/2013, 4:40 PM  LOS: 18 days

## 2013-10-19 NOTE — Progress Notes (Signed)
[facility] Progress Note  Subjective:   C/o left stump pain - "feels like it is still there  Objective Filed Vitals:   10/19/13 0814 10/19/13 0830 10/19/13 0900 10/19/13 0930  BP: 120/72 121/75 102/61 144/84  Pulse: 89 92 92 96  Temp:      TempSrc:      Resp:   16   Height:      Weight:      SpO2:       Physical Exam goal 1.5 General: eyes open more alert, intermittently follows commands, slightly restless moving legs, trying to lie crossways in bed on HD Heart: RRR  Lungs:  no rales  Abdomen: soft NT  Extremities: bilat BKA no edema on left with scabbed areas; right wrapped - blood on right distal stump - BRB on sheet changed pre HD Dialysis Access: left AVF Qb 400  Dialysis: [facility]  4h 76mn 72.5kg 2/2.0 Bath LUA AVF Heparin 5000 / 2400  Hectorol 1 Epo none Venofer none   Assessment/Plan:  1. AMS - no improvement; multifactorial= depression/ ho cva/ acute Illness/ repeat Head CT neg acute findings Neurology concerned about possible CVA, but pt unable to do MRI yesterday due to agitation; for MRI with sedation  2. Fever - tmax 99.2 with no incr wbc Yesterday abdom CT no acute abnormality - on Vanc and zosyn  2. SP R BKA revision 3/18.= on vanco /zosyn; ortho believes MRI of stump would have low yield; stump bleeding this am; only got tight heparin BUT was bleeding before heparin given - also on SQ heparin 5000 tid 3. ESRD - TTS - K 3.7  - use 4 K bath  4. Nutrition - NG placed sec to poor po intake, TF started - nepro 45/hr  5. PVD with bilateral BKAs ( L BKA 06/09/13, R BKA 09/08/13 - revised 3/18)  6. Hx CVA - see #1  7. Hep C + LFT ok  8. FTT/debiliatated/from SNF - palliative care has seen full code Palliative care met with pt's wife on 3/22, but "despite poor prognosis" pt's wife did not want to place any limits on care  9. Depression - seen by psych - liquid prozac started 3/23  10. Anemia - Hgb 10 > 9.3 > 9.7 stable Aranesp 25 Q Tuesday  11. HTN/  volume bed wts highly variable; UF 2.3 3/21, 668 3/24 with post weight 79.5 and edw 72.5; - weight changes not reflective of I/O - goal 1.5 on HD today seems appropriate per crit line 12. MBD hectorol on hold for hypercalcemia - phoslyra (Ca based only option for TF - use 2 Ca bath when K allows; follow; actually surprised P is high; CMP done earlier so renal not drawn on HD, will have P checked on blood sent 12 Hypophosphatemia- from refeeding, < 0.5  MMyriam Jacobson PA-C [facility] Beeper [id number]/26/2015,10:02 AM  LOS: 18 days   Pt seen, examined, agree w assess/plan as above with additions as indicated. Phos down from refeeding, replacing IV.  Otherwise as above.   RKelly SplinterMD pager [phone]   cell [phone]/26/2015, 11:18 AM      Additional Objective Labs: Basic Metabolic Panel:  Recent Labs Lab 10/14/13 1659  10/17/13 0758 10/18/13 0405 10/19/13 0546  NA 143  < > 138 136* 139  K 4.8  < > 4.0 3.3* 3.7  CL 100  < > 90* 92* 92*  CO2 26  < > 26 27 25  GLUCOSE 114*  < > 149* 155* 105*  BUN 33*  < > 45* 24* 34*  CREATININE 7.01*  < > 7.52* 4.21* 6.19*  CALCIUM 9.3  < > 9.7 9.6 10.0  PHOS 6.5*  --  7.1*  --   --   < > = values in this interval not displayed. Liver Function Tests:  Recent Labs Lab 10/17/13 0500 10/17/13 0758 10/18/13 0405 10/19/13 0546  AST 20  --  19 22  ALT 10  --  11 10  ALKPHOS 55  --  57 58  BILITOT 0.4  --  0.4 0.5  PROT 8.3  --  8.6* 8.7*  ALBUMIN 2.3* 2.3* 2.3* 2.3*   CBC:  Recent Labs Lab 10/15/13 0844 10/16/13 0540 10/17/13 0758 10/18/13 0405 10/19/13 0546  WBC 9.0 9.2 8.7 10.1 9.6  NEUTROABS 6.2 5.9  --  7.2 6.8  HGB 10.0* 9.3* 8.7* 9.6* 9.7*  HCT 30.8* 28.6* 27.3* 30.4* 29.9*  MCV 85.8 85.6 85.6 87.1 86.2  PLT 455* 405* 390 404* 409*   Blood Culture    Component Value Date/Time   SDES BLOOD RIGHT HAND 10/14/2013 1550   SPECREQUEST BOTTLES DRAWN AEROBIC ONLY 5 CC 10/14/2013 1550   CULT   Value:        BLOOD CULTURE RECEIVED NO GROWTH TO DATE CULTURE WILL BE HELD FOR 5 DAYS BEFORE ISSUING A FINAL NEGATIVE REPORT Performed at [facility] 10/14/2013 1550   REPTSTATUS PENDING 10/14/2013 1550    CBG:  Recent Labs Lab 10/18/13 1722 10/18/13 2011 10/18/13 2343 10/19/13 0434 10/19/13 0734  GLUCAP 108* 109* 124* 96 116*  Medications: . dextrose 5 % and 0.9% NaCl 10 mL/hr at 10/16/13 0009  . feeding supplement (NEPRO CARB STEADY) Stopped (10/18/13 1100)   . bisacodyl  5 mg Oral BID  . darbepoetin (ARANESP) injection - DIALYSIS  25 mcg Intravenous Q Tue-HD  . feeding supplement (PRO-STAT SUGAR FREE 64)  30 mL Per Tube Q1200  . FLUoxetine  20 mg Per Tube Daily  . heparin  5,000 Units Subcutaneous 3 times per day  . insulin aspart  0-20 Units Subcutaneous 6 times per day  . labetalol  100 mg Oral BID  . multivitamin  1 tablet Oral QHS  . neomycin-bacitracin-polymyxin   Topical TID  . ondansetron (ZOFRAN) IV  4 mg Intravenous 3 times per day  . piperacillin-tazobactam (ZOSYN)  IV  2.25 g Intravenous 3 times per day  . sodium chloride  3 mL Intravenous Q12H  . vancomycin  1,000 mg Intravenous Q T,Th,Sa-HD

## 2013-10-19 NOTE — Progress Notes (Signed)
CRITICAL VALUE ALERT  Critical value received:  Phosphorous <0.5  Date of notification:  10/19/13  Time of notification:  1110  Critical value read back Yes  Nurse who received alert:  Wallace Cullens   MD notified (1st page):  [place]   Time of first page:  1111  MD notified (2nd page):  Time of second page:  Responding MD:  Jonnie Finner  Time MD responded:  [phone]

## 2013-10-20 LAB — CBC WITH DIFFERENTIAL/PLATELET
BASOS PCT: 0 % (ref 0–1)
Basophils Absolute: 0 10*3/uL (ref 0.0–0.1)
Eosinophils Absolute: 0.2 10*3/uL (ref 0.0–0.7)
Eosinophils Relative: 2 % (ref 0–5)
HCT: 33 % — ABNORMAL LOW (ref 39.0–52.0)
Hemoglobin: 10.6 g/dL — ABNORMAL LOW (ref 13.0–17.0)
Lymphocytes Relative: 17 % (ref 12–46)
Lymphs Abs: 1.7 10*3/uL (ref 0.7–4.0)
MCH: 28.1 pg (ref 26.0–34.0)
MCHC: 32.1 g/dL (ref 30.0–36.0)
MCV: 87.5 fL (ref 78.0–100.0)
MONO ABS: 1.3 10*3/uL — AB (ref 0.1–1.0)
Monocytes Relative: 13 % — ABNORMAL HIGH (ref 3–12)
Neutro Abs: 6.9 10*3/uL (ref 1.7–7.7)
Neutrophils Relative %: 69 % (ref 43–77)
Platelets: 501 10*3/uL — ABNORMAL HIGH (ref 150–400)
RBC: 3.77 MIL/uL — ABNORMAL LOW (ref 4.22–5.81)
RDW: 20.9 % — ABNORMAL HIGH (ref 11.5–15.5)
WBC: 10 10*3/uL (ref 4.0–10.5)

## 2013-10-20 LAB — COMPREHENSIVE METABOLIC PANEL
ALBUMIN: 2.5 g/dL — AB (ref 3.5–5.2)
ALT: 16 U/L (ref 0–53)
AST: 35 U/L (ref 0–37)
Alkaline Phosphatase: 66 U/L (ref 39–117)
BUN: 22 mg/dL (ref 6–23)
CALCIUM: 9.6 mg/dL (ref 8.4–10.5)
CO2: 27 meq/L (ref 19–32)
Chloride: 93 mEq/L — ABNORMAL LOW (ref 96–112)
Creatinine, Ser: 3.98 mg/dL — ABNORMAL HIGH (ref 0.50–1.35)
GFR calc Af Amer: 17 mL/min — ABNORMAL LOW (ref >90)
GFR calc non Af Amer: 15 mL/min — ABNORMAL LOW (ref >90)
Glucose, Bld: 104 mg/dL — ABNORMAL HIGH (ref 70–99)
Potassium: 3.9 mEq/L (ref 3.7–5.3)
SODIUM: 139 meq/L (ref 137–147)
Total Bilirubin: 0.5 mg/dL (ref 0.3–1.2)
Total Protein: 9.4 g/dL — ABNORMAL HIGH (ref 6.0–8.3)

## 2013-10-20 LAB — CULTURE, BLOOD (ROUTINE X 2)
CULTURE: NO GROWTH
Culture: NO GROWTH

## 2013-10-20 LAB — MAGNESIUM: Magnesium: 2.3 mg/dL (ref 1.5–2.5)

## 2013-10-20 LAB — GLUCOSE, CAPILLARY
GLUCOSE-CAPILLARY: 162 mg/dL — AB (ref 70–99)
Glucose-Capillary: 154 mg/dL — ABNORMAL HIGH (ref 70–99)
Glucose-Capillary: 155 mg/dL — ABNORMAL HIGH (ref 70–99)
Glucose-Capillary: 156 mg/dL — ABNORMAL HIGH (ref 70–99)
Glucose-Capillary: 167 mg/dL — ABNORMAL HIGH (ref 70–99)
Glucose-Capillary: 176 mg/dL — ABNORMAL HIGH (ref 70–99)

## 2013-10-20 NOTE — Progress Notes (Signed)
Occupational Therapy Discharge Patient Details Name: ROSSI BURDO MRN: [id number] DOB: Aug 15, 1950 Today's Date: 10/20/2013 Time: 3729-0211 OT Time Calculation (min): 12 min  Patient discharged from OT services secondary to patient has made no progress toward goals in a reasonable time frame.  Please see latest therapy progress note for current level of functioning and progress toward goals.      GO     Emmit [facility]ers [facility] 10/20/2013, 1:58 PM

## 2013-10-20 NOTE — Progress Notes (Signed)
[facility] Progress Note  Subjective:   Alert, non-verbal  Objective Filed Vitals:   10/19/13 2021 10/19/13 2200 10/20/13 0500 10/20/13 0852  BP: 127/87 123/84 120/70 126/85  Pulse: 90 92 94 95  Temp: 97.6 F (36.4 C)  100.1 F (37.8 C) 99.2 F (37.3 C)  TempSrc: Oral  Oral   Resp: '18  20 17  ' Height:      Weight:      SpO2: 98%  98% 97%   Physical Exam General: alert, flat affect, follows commands but does not answer questions Heart: RRR Lungs: CTA bilat, no appreciable wheezes, rales Abdomen: Soft, NT, +BS Extremities: bilat BKA, L stump with scabbed ulcerations, no edema. R stump ace-wrapped, sm amt of fresh blood distally Dialysis Access: LUA AVF + bruit  D[facility]: [facility]  4h 58mn 72.5kg 2/2.0 Bath LUA AVF Heparin 5000 / 2400  Hectorol 1 Epo none Venofer none   Assessment/Plan: 1. AMS - no improvement; multifactorial= depression/ hx cva/ acute Illness/ repeat Head CT neg acute findings Neurology concerned about possible CVA, MRI head/brain on 3/26 - no acute findings  2. Fever - Tmax 99.2, WBC stable, CT abdomen with no acute abnormality, Blood Cx's neg. On Vanc and zosyn.   3. Bleeding s/p R BKA revision 3/18 -  MRI tib/fib negative for abscess or cellulitis, small hematoma and effusion present. Mgmt per ortho 4. ESRD - TTS - K 3.9. HD Sat with added K+ bath 5. Anemia - Hgb 10.6, improving. Continue Aranesp 25 Q Tuesday. Last Tsat 40 on 2/28. No Fe for now. 6. HTN/ volume - SBPs 120's on labetalol BID. Above edw. No overt excess. Bed wts highly variable; Weight changes not reflective of I/O.  7. MBD - Corr Ca 10.8. Continue holding Vit D. Low phos, hold binder.  Ca based only option for TF - use 2 Ca bath when K allows 8. Nutrition - NG placed sec to poor po intake, TF - nepro 45/hr, prostat 9. Hep C + LFT ok  10. FTT/debiliatated/from SNF -  Palliative care met with pt's wife on 3/22, but "despite poor prognosis" pt's wife did not want to place any  limits on care  11. Depression - seen by psych - liquid prozac started 3/23  12. Hypophosphatemia - from refeeding, < 0.5, no binders  KCollene Leyden WCletus Gash PA-C [facility] Pager [phone]/27/2015,9:00 AM  LOS: 19 days   Pt seen, examined, agree w assess/plan as above with additions as indicated.  RKelly SplinterMD pager [phone]   cell [phone]/27/2015, 1:02 PM      Additional Objective Labs: Basic Metabolic Panel:  Recent Labs Lab 10/14/13 1659  10/17/13 0758 10/18/13 0405 10/19/13 0546 10/19/13 1027 10/20/13 0636  NA 143  < > 138 136* 139  --  139  K 4.8  < > 4.0 3.3* 3.7  --  3.9  CL 100  < > 90* 92* 92*  --  93*  CO2 26  < > '26 27 25  ' --  27  GLUCOSE 114*  < > 149* 155* 105*  --  104*  BUN 33*  < > 45* 24* 34*  --  22  CREATININE 7.01*  < > 7.52* 4.21* 6.19*  --  3.98*  CALCIUM 9.3  < > 9.7 9.6 10.0  --  9.6  PHOS 6.5*  --  7.1*  --   --  <0.5*  --   < > = values in this interval not  displayed. Liver Function Tests:  Recent Labs Lab 10/18/13 0405 10/19/13 0546 10/20/13 0636  AST 19 22 35  ALT '11 10 16  ' ALKPHOS 57 58 66  BILITOT 0.4 0.5 0.5  PROT 8.6* 8.7* 9.4*  ALBUMIN 2.3* 2.3* 2.5*   CBC:  Recent Labs Lab 10/16/13 0540 10/17/13 0758 10/18/13 0405 10/19/13 0546 10/20/13 0636  WBC 9.2 8.7 10.1 9.6 10.0  NEUTROABS 5.9  --  7.2 6.8 6.9  HGB 9.3* 8.7* 9.6* 9.7* 10.6*  HCT 28.6* 27.3* 30.4* 29.9* 33.0*  MCV 85.6 85.6 87.1 86.2 87.5  PLT 405* 390 404* 409* 501*   Blood Culture    Component Value Date/Time   SDES BLOOD RIGHT HAND 10/14/2013 1550   SPECREQUEST BOTTLES DRAWN AEROBIC ONLY 5 CC 10/14/2013 1550   CULT  Value: NO GROWTH 5 DAYS Performed at [facility] 10/14/2013 1550   REPTSTATUS 10/20/2013 FINAL 10/14/2013 1550    CBG:  Recent Labs Lab 10/19/13 1657 10/19/13 2012 10/20/13 0002 10/20/13 0407 10/20/13 0750  GLUCAP 117* 155* 176* 167* 156*    Studies/Results: Mr Jodene Nam Head Wo Contrast  10/19/2013    CLINICAL DATA:  Altered mental status.  Unable to communicate.  EXAM: MRI HEAD WITHOUT CONTRAST  MRA HEAD WITHOUT CONTRAST  TECHNIQUE: Multiplanar, multiecho pulse sequences of the brain and surrounding structures were obtained without intravenous contrast. Angiographic images of the head were obtained using MRA technique without contrast.  COMPARISON:  CT HEAD W/O CM dated 10/16/2013; CT HEAD W/O CM dated 10/01/2013; MR HEAD W/O CM dated 02/26/2013  FINDINGS: MRI HEAD FINDINGS  Severe atrophy and chronic microvascular ischemic change are re- demonstrated. Sequelae of remote lacunar infarction are re- demonstrated. Numerous small foci of chronic hemorrhage on susceptibility weighted imaging suggest Advanced hypertensive cerebral vascular disease; amyloid angiopathy less likely in the absence of large lobar hemorrhage. No midline shift. No tonsillar herniation. Mild cervical spondylosis. Flow voids are maintained throughout the carotid, basilar, and vertebral arteries. Visualized calvarium, skull base, and upper cervical osseous structures unremarkable. Scalp and extracranial soft tissues, orbits, sinuses, and mastoids show no acute process.  Compared with most recent prior MR, the atrophy has progressed slightly. There is slight worsening of advanced chronic microvascular ischemic change as well. No new areas of mineralization/hemorrhage.  MRA HEAD FINDINGS  The internal carotid arteries demonstrate no flow reducing lesion or significant irregularity. Bilateral fetal PCA origins contribute to basilar hypoplasia. Right vertebral dominant. Left vertebral ends in PICA. No proximal stenosis of the middle or posterior cerebral arteries. Mild irregularity of the A1 ACA on the right but likely to be significant. No cerebellar branch occlusion. No intracranial aneurysm.  IMPRESSION: Progression of atrophy and small vessel disease. No acute intracranial findings.  No interval change in the appearance of multiple small foci of  chronic hemorrhage.  No proximal significant flow reducing lesion is evident.   Electronically Signed   By: Rolla Flatten M.D.   On: 10/19/2013 17:44   Mr Brain Wo Contrast  10/19/2013   CLINICAL DATA:  Altered mental status.  Unable to communicate.  EXAM: MRI HEAD WITHOUT CONTRAST  MRA HEAD WITHOUT CONTRAST  TECHNIQUE: Multiplanar, multiecho pulse sequences of the brain and surrounding structures were obtained without intravenous contrast. Angiographic images of the head were obtained using MRA technique without contrast.  COMPARISON:  CT HEAD W/O CM dated 10/16/2013; CT HEAD W/O CM dated 10/01/2013; MR HEAD W/O CM dated 02/26/2013  FINDINGS: MRI HEAD FINDINGS  Severe atrophy and chronic microvascular ischemic  change are re- demonstrated. Sequelae of remote lacunar infarction are re- demonstrated. Numerous small foci of chronic hemorrhage on susceptibility weighted imaging suggest Advanced hypertensive cerebral vascular disease; amyloid angiopathy less likely in the absence of large lobar hemorrhage. No midline shift. No tonsillar herniation. Mild cervical spondylosis. Flow voids are maintained throughout the carotid, basilar, and vertebral arteries. Visualized calvarium, skull base, and upper cervical osseous structures unremarkable. Scalp and extracranial soft tissues, orbits, sinuses, and mastoids show no acute process.  Compared with most recent prior MR, the atrophy has progressed slightly. There is slight worsening of advanced chronic microvascular ischemic change as well. No new areas of mineralization/hemorrhage.  MRA HEAD FINDINGS  The internal carotid arteries demonstrate no flow reducing lesion or significant irregularity. Bilateral fetal PCA origins contribute to basilar hypoplasia. Right vertebral dominant. Left vertebral ends in PICA. No proximal stenosis of the middle or posterior cerebral arteries. Mild irregularity of the A1 ACA on the right but likely to be significant. No cerebellar branch  occlusion. No intracranial aneurysm.  IMPRESSION: Progression of atrophy and small vessel disease. No acute intracranial findings.  No interval change in the appearance of multiple small foci of chronic hemorrhage.  No proximal significant flow reducing lesion is evident.   Electronically Signed   By: Rolla Flatten M.D.   On: 10/19/2013 17:44   Mr Tibia Fibula Right Wo Contrast  10/19/2013   CLINICAL DATA:  Persistent fevers after revision of right lower extremity below knee amputation due to abscess.  EXAM: MRI OF LOWER RIGHT EXTREMITY WITHOUT CONTRAST  TECHNIQUE: Multiplanar, multisequence MR imaging was performed. No intravenous contrast was administered.  COMPARISON:  MRI dated 10/06/2013  FINDINGS: There is small hematoma in the posterior aspect of the stump measuring 6.6 x 3.0 x 4.0 cm. There is no evidence of an abscess. There is no osseous edema. There is a small right knee effusion which is unchanged. The adjacent muscle structures appear normal.  IMPRESSION: At and 1  1. No evidence of osteomyelitis or soft tissue abscess or cellulitis. 2. Small hematoma in the posterior aspect of the stump. 3. Small right knee effusion, unchanged since the prior exam and only minimally more prominent than in the opposite knee.   Electronically Signed   By: Rozetta Nunnery M.D.   On: 10/19/2013 17:08   Medications: . dextrose 5 % and 0.9% NaCl 10 mL/hr at 10/16/13 0009  . feeding supplement (NEPRO CARB STEADY) 1,000 mL (10/19/13 1851)   . bisacodyl  5 mg Oral BID  . darbepoetin (ARANESP) injection - DIALYSIS  25 mcg Intravenous Q Tue-HD  . feeding supplement (PRO-STAT SUGAR FREE 64)  30 mL Per Tube Q1200  . FLUoxetine  20 mg Per Tube Daily  . heparin  5,000 Units Subcutaneous 3 times per day  . insulin aspart  0-20 Units Subcutaneous 6 times per day  . labetalol  100 mg Oral BID  . multivitamin  1 tablet Oral QHS  . neomycin-bacitracin-polymyxin   Topical TID  . ondansetron (ZOFRAN) IV  4 mg Intravenous 3  times per day  . piperacillin-tazobactam (ZOSYN)  IV  2.25 g Intravenous 3 times per day  . sodium chloride  3 mL Intravenous Q12H  . vancomycin  1,000 mg Intravenous Q T,Th,Sa-HD

## 2013-10-20 NOTE — Progress Notes (Signed)
Patient ID: Oscar Castillo, male   DOB: 04-24-1951, 63 y.o.   MRN: [id number] [facility] PROGRESS NOTE  LASHUN MCCANTS LFY:[id number] DOB: February 08, 1951 DOA: 10/01/2013 PCP: [name], MD  Brief narrative: 63 year old male with HTN, HLD, CVA in 8527, combined systolic and diastolic CHF, diabetes type 1 controlled, vasculopath, S/P recent right BKA on 08/2013 (follows with Dr. Sharol Given), presented to [facility] ED with progressive failure to thrive, altered mental status. Upon arrival to ED, pt became unresponsive and hypoxic, requiring NRB, Hg ~16 (baseline ~9-10), Tmax 101 F, CXR c/w with RLL PNA.  EVENTS: 3/12 lethargy post HD, right BKA stump pain 3/17 post HD confusion   Assessment/Plan:  Encephalopathy, metabolic  - this is multifactorial and secondary to progressive nature of chronic illnesses, FTT and deconditioning, underlying infectious process - pt alert but overall confused and not answering all the questions appropriately - appreciate neurology input  - MRI brain with no acute events and EEG with encephalopathy  New LBBB  - has been transient in nature  - systolic function normal but septal motion with abnormal function and dyssynergy  - cardiac enzymes negative  Dehydration /FTT (failure to thrive) in adult  - continue dysphagia I diet as pt able to tolerate  - 3/25 patient pulled his NGT - awaiting palliative care team input   Fever  - etiology still unclear, ? RLL PNA, right BKA stump infection, pt currently in Vanc and Zosyn  - Influenza PCR negative  - Blood cultures no growth to date urinalysis unremarkable,  - S/P abscess drainage/revision of right BKA stump on Wednesday 3/18  HYPERTENSION  - reasonable inpatient control  Anemia due to chronic illness  - Hg and Hct remain stable over the past 24 hours  DM (diabetes mellitus) type I controlled with renal manifestation  - On Lantus prior to admission  - on SSI while inpatient until oral intake improves  CKD (chronic kidney  disease) stage V requiring chronic dialysis  - Usual dialysis days Tuesday Thursday Saturday  Chronic combined systolic (EF 78%) and grade 2 diastolic congestive heart failure  - Compensated and managed with dialysis on T/Th/Sat  CEREBROVASCULAR ACCIDENT, HX OF  - No evidence of acute infarct  Right BKA and osteomyelitis of right BKA stump  - 3/14 MRI of right BKA stump shows abscess/osteomyelitis see results below  - on Vanc and Zosyn  - Dr. Meridee Score (orthopedic surgeon) revised right BKA stump secondary abscess/osteomyelitis on 3/18  Severe malnutrition  - secondary to FTT  Consultants:  Dr. Roney Jaffe Nephrology  Dr Meridee Score (Orthopedic Surgery)  Dr. Jean Rosenthal (orthopedic surgery)  Dr. Rhea Pink (palliative care)  Radiology for lumbar puncture  Procedures:   Mr Brain Wo Contrast  10/19/2013   Progression of atrophy and small vessel disease. No acute intracranial findings.  No interval change in the appearance of multiple small foci of chronic hemorrhage.    Mr Tibia Fibula Right Wo Contrast  10/19/2013  No evidence of osteomyelitis or soft tissue abscess or cellulitis. Small hematoma in the posterior aspect of the stump. Small right knee effusion, unchanged since the prior exam and only minimally more prominent than in the opposite knee.    Antibiotics:   Zosyn 3/14>>   Vancomycin (full dose) 3/14>>   Aztreonam 3/08 >>> stopped 3/14   Vancomycin 3/08 >>> stopped 3/14  Code Status: Full Family Communication: No family at bedside  Disposition Plan: Remains inpatient   HPI/Subjective: No events overnight.   Objective:  Filed Vitals:   10/20/13 0500 10/20/13 0852 10/20/13 1300 10/20/13 1617  BP: 120/70 126/85 137/89 128/87  Pulse: 94 95 96 88  Temp: 100.1 F (37.8 C) 99.2 F (37.3 C) 98.6 F (37 C) 98.6 F (37 C)  TempSrc: Oral     Resp: 20 17 18 17   Height:      Weight:   80 kg (176 lb 5.9 oz)   SpO2: 98% 97% 98% 98%    Intake/Output  Summary (Last 24 hours) at 10/20/13 1757 Last data filed at 10/20/13 1617  Gross per 24 hour  Intake    666 ml  Output      0 ml  Net    666 ml    Exam:   General:  Pt is alert, follows commands appropriately, not in acute distress  Cardiovascular: Regular rate and rhythm,  no rubs, no gallops  Respiratory: Clear to auscultation bilaterally, diminished breath sounds at bases   Abdomen: Soft, non tender, non distended, bowel sounds present, no guarding  Extremities: bilateral BKA  Data Reviewed: Basic Metabolic Panel:  Recent Labs Lab 10/14/13 1659  10/16/13 0540 10/17/13 0500 10/17/13 0758 10/18/13 0405 10/19/13 0546 10/19/13 1027 10/20/13 0636  NA 143  < > 136* 137 138 136* 139  --  139  K 4.8  < > 4.3 4.0 4.0 3.3* 3.7  --  3.9  CL 100  < > 90* 89* 90* 92* 92*  --  93*  CO2 26  < > 26 25 26 27 25   --  27  GLUCOSE 114*  < > 137* 145* 149* 155* 105*  --  104*  BUN 33*  < > 32* 44* 45* 24* 34*  --  22  CREATININE 7.01*  < > 5.61* 7.49* 7.52* 4.21* 6.19*  --  3.98*  CALCIUM 9.3  < > 9.7 9.6 9.7 9.6 10.0  --  9.6  MG 2.4  < > 2.3 2.5  --  2.2 2.4  --  2.3  PHOS 6.5*  --   --   --  7.1*  --   --  <0.5*  --   < > = values in this interval not displayed. Liver Function Tests:  Recent Labs Lab 10/16/13 0540 10/17/13 0500 10/17/13 0758 10/18/13 0405 10/19/13 0546 10/20/13 0636  AST 21 20  --  19 22 35  ALT 11 10  --  11 10 16   ALKPHOS 58 55  --  57 58 66  BILITOT 0.5 0.4  --  0.4 0.5 0.5  PROT 8.4* 8.3  --  8.6* 8.7* 9.4*  ALBUMIN 2.4* 2.3* 2.3* 2.3* 2.3* 2.5*   CBC:  Recent Labs Lab 10/15/13 0844 10/16/13 0540 10/17/13 0758 10/18/13 0405 10/19/13 0546 10/20/13 0636  WBC 9.0 9.2 8.7 10.1 9.6 10.0  NEUTROABS 6.2 5.9  --  7.2 6.8 6.9  HGB 10.0* 9.3* 8.7* 9.6* 9.7* 10.6*  HCT 30.8* 28.6* 27.3* 30.4* 29.9* 33.0*  MCV 85.8 85.6 85.6 87.1 86.2 87.5  PLT 455* 405* 390 404* 409* 501*   CBG:  Recent Labs Lab 10/20/13 0002 10/20/13 0407 10/20/13 0750  10/20/13 1130 10/20/13 1620  GLUCAP 176* 167* 156* 154* 162*    Recent Results (from the past 240 hour(s))  CULTURE, BLOOD (ROUTINE X 2)     Status: None   Collection Time    10/14/13  3:45 PM      Result Value Ref Range Status   Specimen Description BLOOD RIGHT ARM  Final   Special Requests BOTTLES DRAWN AEROBIC ONLY 7 CC   Final   Culture  Setup Time     Final   Value: 10/14/2013 20:19     Performed at [facility]   Culture     Final   Value: NO GROWTH 5 DAYS     Performed at [facility]   Report Status 10/20/2013 FINAL   Final  CULTURE, BLOOD (ROUTINE X 2)     Status: None   Collection Time    10/14/13  3:50 PM      Result Value Ref Range Status   Specimen Description BLOOD RIGHT HAND   Final   Special Requests BOTTLES DRAWN AEROBIC ONLY 5 CC   Final   Culture  Setup Time     Final   Value: 10/14/2013 20:19     Performed at [facility]   Culture     Final   Value: NO GROWTH 5 DAYS     Performed at [facility]   Report Status 10/20/2013 FINAL   Final     Scheduled Meds: . bisacodyl  5 mg Oral BID  . darbepoetin (ARANESP) injection - DIALYSIS  25 mcg Intravenous Q Tue-HD  . feeding supplement (PRO-STAT SUGAR FREE 64)  30 mL Per Tube Q1200  . FLUoxetine  20 mg Per Tube Daily  . heparin  5,000 Units Subcutaneous 3 times per day  . insulin aspart  0-20 Units Subcutaneous 6 times per day  . labetalol  100 mg Oral BID  . multivitamin  1 tablet Oral QHS  . neomycin-bacitracin-polymyxin   Topical TID  . ondansetron (ZOFRAN) IV  4 mg Intravenous 3 times per day  . piperacillin-tazobactam (ZOSYN)  IV  2.25 g Intravenous 3 times per day  . sodium chloride  3 mL Intravenous Q12H  . vancomycin  1,000 mg Intravenous Q T,Th,Sa-HD   Continuous Infusions: . dextrose 5 % and 0.9% NaCl 10 mL/hr at 10/20/13 1441  . feeding supplement (NEPRO CARB STEADY) 1,000 mL (10/19/13 1851)   Faye Ramsay, MD  [facility] Pager [phone]  If 7PM-7AM,  please contact night-coverage [email or web address] Password [facility] 10/20/2013, 5:57 PM   LOS: 19 days

## 2013-10-20 NOTE — Progress Notes (Signed)
Physical Therapy Treatment Patient Details Name: Oscar Castillo MRN: [id number] DOB: [birth year] Today's Date: 10/20/2013    History of Present Illness 63 y.o. male admitted to [facility] on 10/01/13 with AMS and FTT.  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 Of note, he has history of L BKA as well. Pt with significant PMHx of diabetes and severe peripheral vascular disease who also has end-stage renal disease and undergoes dialysis.  S/p revision right transtibial amputation 3/18    PT Comments    Patient limited in participation due to cognitive deficits.  Most of session focused on getting pt to keep head upright while in wheelchair and to get pt to engage in his environment.    Follow Up Recommendations  SNF;Supervision/Assistance - 24 hour     Equipment Recommendations  None recommended by PT    Recommendations for Other Services       Precautions / Restrictions Precautions Precautions: Fall Precaution Comments: pt also with poor vision, wife reporting he has to have cataract surgery Restrictions Other Position/Activity Restrictions: bil BKA    Mobility  Bed Mobility Overal bed mobility: Needs Assistance       Supine to sit: +2 for physical assistance;Total assist Sit to supine: +2 for physical assistance;Total assist   General bed mobility comments: given increased time to initiate coming up to sit with cues, but needed total assist  Transfers Overall transfer level: Needs assistance   Transfers: Anterior-Posterior Transfer       Anterior-Posterior transfers: +2 physical assistance;Total assist   General transfer comment: hand over hand placement of pt's hand on arms of wheelchair to go back to bed and increased time given after cues to increase patient participation with transfer, but still needed total assist  Ambulation/Gait                 Hotel manager mobility:  Yes Wheelchair parts: Needs assistance Wheelchair Assistance Details (indicate cue type and reason): placed hands on wheels and attempted to have pt initiate w/c mobility, but did not participate; rolled pt around unit attempting to engage in environmental orientation, etc  Modified Rankin (Stroke Patients Only)       Balance Overall balance assessment: Needs assistance     Sitting balance - Comments: mod - max support at edge of bed; assist in wheelchair to keep from leaning to right Postural control: Posterior lean                          Cognition Arousal/Alertness: Awake/alert Behavior During Therapy: Flat affect Overall Cognitive Status: Impaired/Different from baseline Area of Impairment: Following commands;Attention;Problem solving   Current Attention Level: Focused   Following Commands: Follows one step commands inconsistently;Follows one step commands with increased time Safety/Judgement: Decreased awareness of deficits   Problem Solving: Slow processing;Decreased initiation;Requires verbal cues;Requires tactile cues      Exercises      General Comments        Pertinent Vitals/Pain No pain evident    Home Living                      Prior Function            PT Goals (current goals can now be found in the care plan section) Progress towards PT goals: Progressing toward goals    Frequency  Min  2X/week    PT Plan Current plan remains appropriate    End of Session     Patient left: in bed     Time: 1052-1120 PT Time Calculation (min): 28 min  Charges:  $Therapeutic Activity: 23-37 mins                    G Codes:      Madhuri Vacca,CYNDI 10-21-2013, 12:59 PM Magda Kiel, [place] 10-21-2013

## 2013-10-20 NOTE — Progress Notes (Signed)
ANTIBIOTIC CONSULT NOTE - FOLLOW UP  Pharmacy Consult for vancomycin and zosyn Indication: right BKA stump abscess/osteomyelitis  No Known Allergies  Patient Measurements: Height:  (bilateral amputee) Weight: 167 lb 8.8 oz (76 kg) IBW/kg (Calculated) : 40.8   Vital Signs: Temp: 99.2 F (37.3 C) (03/27 0852) Temp src: Oral (03/27 0500) BP: 126/85 mmHg (03/27 0852) Pulse Rate: 95 (03/27 0852) Intake/Output from previous day: 03/26 0701 - 03/27 0700 In: 666 [I.V.:240; NG/GT:20; IV Piggyback:406] Out: 1028  Intake/Output from this shift:    Labs:  Recent Labs  10/18/13 0405 10/19/13 0546 10/20/13 0636  WBC 10.1 9.6 10.0  HGB 9.6* 9.7* 10.6*  PLT 404* 409* 501*  CREATININE 4.21* 6.19* 3.98*   Estimated Creatinine Clearance: 14.9 ml/min (by C-G formula based on Cr of 3.98). No results found for this basename: VANCOTROUGH, Corlis Leak, VANCORANDOM, GENTTROUGH, GENTPEAK, GENTRANDOM, TOBRATROUGH, TOBRAPEAK, TOBRARND, AMIKACINPEAK, AMIKACINTROU, AMIKACIN,  in the last 72 hours   Microbiology: Recent Results (from the past 720 hour(s))  CULTURE, BLOOD (ROUTINE X 2)     Status: None   Collection Time    10/01/13 11:35 AM      Result Value Ref Range Status   Specimen Description BLOOD RIGHT WRIST   Final   Special Requests BOTTLES DRAWN AEROBIC ONLY 10CC   Final   Culture  Setup Time     Final   Value: 10/01/2013 19:00     Performed at [facility]   Culture     Final   Value: NO GROWTH 5 DAYS     Performed at [facility]   Report Status 10/07/2013 FINAL   Final  CULTURE, BLOOD (ROUTINE X 2)     Status: None   Collection Time    10/01/13 12:01 PM      Result Value Ref Range Status   Specimen Description BLOOD RIGHT FOREARM   Final   Special Requests BOTTLES DRAWN AEROBIC AND ANAEROBIC 5CC   Final   Culture  Setup Time     Final   Value: 10/01/2013 18:59     Performed at [facility]   Culture     Final   Value: NO GROWTH 5 DAYS   Performed at [facility]   Report Status 10/07/2013 FINAL   Final  CSF CULTURE     Status: None   Collection Time    10/02/13 10:55 AM      Result Value Ref Range Status   Specimen Description CSF   Final   Special Requests Normal   Final   Gram Stain     Final   Value: FEW WBC PRESENT, PREDOMINANTLY MONONUCLEAR     NO ORGANISMS SEEN     Performed at [facility]   Culture     Final   Value: NO GROWTH 3 DAYS     Performed at [facility]   Report Status 10/06/2013 FINAL   Final  GRAM STAIN     Status: None   Collection Time    10/02/13 10:55 AM      Result Value Ref Range Status   Specimen Description CSF   Final   Special Requests Normal   Final   Gram Stain     Final   Value: CYTOSPIN PREP     WBC PRESENT, PREDOMINANTLY MONONUCLEAR     NO ORGANISMS SEEN   Report Status 10/02/2013 FINAL   Final  MRSA PCR SCREENING     Status: None  Collection Time    10/02/13 11:18 PM      Result Value Ref Range Status   MRSA by PCR NEGATIVE  NEGATIVE Final   Comment:            The GeneXpert MRSA Assay (FDA     approved for NASAL specimens     only), is one component of a     comprehensive MRSA colonization     surveillance program. It is not     intended to diagnose MRSA     infection nor to guide or     monitor treatment for     MRSA infections.  CLOSTRIDIUM DIFFICILE BY PCR     Status: None   Collection Time    10/05/13  2:05 AM      Result Value Ref Range Status   C difficile by pcr NEGATIVE  NEGATIVE Final  CULTURE, BLOOD (ROUTINE X 2)     Status: None   Collection Time    10/05/13 10:42 PM      Result Value Ref Range Status   Specimen Description BLOOD RIGHT UPPER ARM   Final   Special Requests BOTTLES DRAWN AEROBIC AND ANAEROBIC 10CC   Final   Culture  Setup Time     Final   Value: 10/06/2013 03:56     Performed at [facility]   Culture     Final   Value: NO GROWTH 5 DAYS     Performed at [facility]   Report Status  10/12/2013 FINAL   Final  CULTURE, BLOOD (ROUTINE X 2)     Status: None   Collection Time    10/05/13 10:56 PM      Result Value Ref Range Status   Specimen Description BLOOD RIGHT FOREARM   Final   Special Requests BOTTLES DRAWN AEROBIC AND ANAEROBIC 10CC   Final   Culture  Setup Time     Final   Value: 10/06/2013 03:56     Performed at [facility]   Culture     Final   Value: NO GROWTH 5 DAYS     Performed at [facility]   Report Status 10/12/2013 FINAL   Final  CULTURE, BLOOD (ROUTINE X 2)     Status: None   Collection Time    10/14/13  3:45 PM      Result Value Ref Range Status   Specimen Description BLOOD RIGHT ARM   Final   Special Requests BOTTLES DRAWN AEROBIC ONLY 7 CC   Final   Culture  Setup Time     Final   Value: 10/14/2013 20:19     Performed at [facility]   Culture     Final   Value: NO GROWTH 5 DAYS     Performed at [facility]   Report Status 10/20/2013 FINAL   Final  CULTURE, BLOOD (ROUTINE X 2)     Status: None   Collection Time    10/14/13  3:50 PM      Result Value Ref Range Status   Specimen Description BLOOD RIGHT HAND   Final   Special Requests BOTTLES DRAWN AEROBIC ONLY 5 CC   Final   Culture  Setup Time     Final   Value: 10/14/2013 20:19     Performed at [facility]   Culture     Final   Value: NO GROWTH 5 DAYS     Performed at [facility]  Report Status 10/20/2013 FINAL   Final    Anti-infectives   Start     Dose/Rate Route Frequency Ordered Stop   10/14/13 1600  piperacillin-tazobactam (ZOSYN) IVPB 2.25 g     2.25 g 100 mL/hr over 30 Minutes Intravenous 3 times per day 10/14/13 1455     10/14/13 1600  vancomycin (VANCOCIN) IVPB 1000 mg/200 mL premix     1,000 mg 200 mL/hr over 60 Minutes Intravenous Every T-Th-Sa (Hemodialysis) 10/14/13 1455     10/11/13 0600  vancomycin (VANCOCIN) IVPB 1000 mg/200 mL premix  Status:  Discontinued     1,000 mg 200 mL/hr over 60 Minutes Intravenous  On call to O.R. 10/10/13 1432 10/11/13 1201   10/07/13 1415  piperacillin-tazobactam (ZOSYN) IVPB 2.25 g  Status:  Discontinued     2.25 g 100 mL/hr over 30 Minutes Intravenous 3 times per day 10/07/13 1402 10/12/13 1217   10/03/13 1200  vancomycin (VANCOCIN) IVPB 750 mg/150 ml premix  Status:  Discontinued     750 mg 150 mL/hr over 60 Minutes Intravenous Every T-Th-Sa (Hemodialysis) 10/01/13 1158 10/12/13 1217   10/02/13 1200  aztreonam (AZACTAM) 1 g in dextrose 5 % 50 mL IVPB  Status:  Discontinued     1 g 100 mL/hr over 30 Minutes Intravenous Every 24 hours 10/01/13 1154 10/07/13 1401   10/01/13 1145  aztreonam (AZACTAM) 2 g in dextrose 5 % 50 mL IVPB     2 g 100 mL/hr over 30 Minutes Intravenous  Once 10/01/13 1131 10/01/13 1241   10/01/13 1145  vancomycin (VANCOCIN) IVPB 1000 mg/200 mL premix     1,000 mg 200 mL/hr over 60 Minutes Intravenous  Once 10/01/13 1131 10/10/13 1558      Assessment: Patient is a 63 y.o M on vancomycin and zosyn for right BKA stump abscess/osteomyeliti with drainage/revision of stump on 3/18.  Tmax 100.1, wbc wnl, all cultures have been negative.  3/19 Vanc level= 18.9 on vanc 750mg  with HD  vanc 3/16>>3/19>> resume 3/21>> Zosyn 3/16>>3/19>> resume 3/21>>  3/16 urine NEG 3/16 blood>>neg 3/21 bcx x2>> neg   Goal of Therapy:  Pre-HD vancomycin level 15-25  Plan:  1) continue vancomycin 1gm qTThS after HD 2) will recheck pre-HD on 3/28 3) continue zosyn 2.25gm IV q8h  Royalty Fakhouri P 10/20/2013,11:12 AM

## 2013-10-20 NOTE — Progress Notes (Signed)
Pt alert upon verbal arousal.  Pt opens eyes when talking to him.

## 2013-10-20 NOTE — Progress Notes (Signed)
Occupational Therapy Treatment Patient Details Name: Oscar Castillo MRN: [id number] DOB: 1950/09/08 Today's Date: 10/20/2013    History of present illness 63 y.o. male admitted to [facility] on 10/01/13 with AMS and FTT.  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 Of note, he has history of L BKA as well. Pt with significant PMHx of diabetes and severe peripheral vascular disease who also has end-stage renal disease and undergoes dialysis.  S/p revision right transtibial amputation 3/18   OT comments  Pt not making progress with goals and continues to demo AMS. Pt with decreased responsiveness/lethargy and unable to follow commands. Pt given instructions by OT for simple grooming tasks at bed level. However pt responding "ok" but without initiation of activity requiring physical assist from OT to initiate/complete tasks. Pt keeping eyes open 30% of time with verbal instruction  Follow Up Recommendations  SNF;Supervision/Assistance - 24 hour    Equipment Recommendations  None recommended by OT    Recommendations for Other Services      Precautions / Restrictions Precautions Precautions: Fall Precaution Comments: pt also with poor vision, wife reporting he has to have cataract surgery Restrictions Weight Bearing Restrictions: No RLE Weight Bearing: Non weight bearing LLE Weight Bearing: Non weight bearing Other Position/Activity Restrictions: bil BKA       Mobility Bed Mobility Overal bed mobility: Needs Assistance Bed Mobility: Rolling Rolling: Max assist      General bed mobility comments: pt resistive, although saying "ok" with commands for rolling  Transfers Overall transfer level: Needs assistance          Anterior-Posterior transfers: +2 physical assistance;Total assist per PT   t        ADL Eating/Feeding: NPO;Total assistance Grooming: Total assistance;Bed level;Wash/dry face;Wash/dry Optometrist   Behavior During Therapy: Flat affect Overall Cognitive Status: Impaired/Different from baseline Area of Impairment: Following commands;Attention;Problem solving   Current Attention Level: Focused    Following Commands: Follows one step commands inconsistently;Follows one step commands with increased time Safety/Judgement: Decreased awareness of deficits   Problem Solving: Slow processing;Decreased initiation;Requires verbal cues;Requires tactile cues      Extremity/Trunk Assessment   Pt unable to follow commands for MMT/ROM to be assessed            Exercises  PROM of B UEs  In all planes x 5 reps           Pertinent Vitals/ Pain       No c/o or indication of pain                                                          Frequency D/C for acute OT services at this time    Progress Toward Goals  OT Goals(current goals can now be found in the care plan section)  Progress towards OT goals: Not progressing toward goals - comment (pt not appropriate for acute OT at this time)     Plan Discharge plan remains  appropriate    End of Session    Activity Tolerance Patient limited by lethargy;Other (comment) (limited by cognition)   Patient Left in bed;with call bell/phone within reach   Nurse Communication Other (comment) (pt's total A and cues required, decreased responsiveness)        Time: 5638-9373 OT Time Calculation (min): 12 min  Charges: OT General Charges $OT Visit: 1 Procedure OT Treatments $Therapeutic Activity: 8-22 mins  [name] 10/20/2013, 1:53 PM

## 2013-10-20 NOTE — Progress Notes (Signed)
NEURO HOSPITALIST PROGRESS NOTE   SUBJECTIVE:                                                                                                                        Seems to be more alert today, although doesn't follow commands. MRI brain showed no acute abnormality.  Serum Cr 3.98, phosphorus <0.5 OBJECTIVE:                                                                                                                           Vital signs in last 24 hours: Temp:  [97.6 F (36.4 C)-100.1 F (37.8 C)] 99.2 F (37.3 C) (03/27 0852) Pulse Rate:  [90-103] 95 (03/27 0852) Resp:  [11-20] 17 (03/27 0852) BP: (108-157)/(57-97) 126/85 mmHg (03/27 0852) SpO2:  [94 %-100 %] 97 % (03/27 0852)  Intake/Output from previous day: 03/26 0701 - 03/27 0700 In: 666 [I.V.:240; NG/GT:20; IV Piggyback:406] Out: 1028  Intake/Output this shift:   Nutritional status: NPO  Past Medical History  Diagnosis Date  . ESRD on hemodialysis 05/05/2007    ESRD due to DM/HTN. Started dialysis in November 2013.  HD TTS at [facility] [place].  [name] BACK PAIN, LUMBAR, CHRONIC 08/06/2009  . BENIGN PROSTATIC HYPERTROPHY 08/01/2010  . CEREBROVASCULAR ACCIDENT, HX OF 08/06/2009  . CHOLELITHIASIS 08/01/2010  . CONGESTIVE HEART FAILURE 03/18/2009  . DEPRESSION 03/18/2009  . DIABETES MELLITUS, TYPE II 03/25/2007  . ERECTILE DYSFUNCTION 03/25/2007  . GERD 03/25/2007  . HEPATITIS C, HX OF 03/25/2007  . HYPERTENSION 03/25/2007  . Morbid obesity 03/25/2007  . NEPHROLITHIASIS, HX OF 03/18/2009  . Complication of anesthesia     wife states pt had trouble waking up with his last surgery in Nov., 2014    Neurologic Exam:  Mental Status:  At times alert but mostly inattentive and will not follow verbal commands. Cranial Nerves:  II: Discs flat bilaterally; no blink threat, pupils equal, round, reactive to light and accommodation  III,IV, VI: ptosis not present, extra-ocular motions--eyes  disconjugate but positive doll's and no left gaze preference.  V,VII: face symmetric  VIII: hearing intact  IX,X: cough reflex present  XI: unable to assess  XII: midline tongue extension  Motor:  Moving bilateral UE  spontaneously but not to command, will not grip hands, lifts bilateral legs  Sensory: winces to pain bilateral finger noxious stimuli and withdrew legs with noxious stimuli.  Deep Tendon Reflexes:  2+ in the UE no KJ --bilateral BKA  Plantars:  Bilateral BKA      Lab Results: Lab Results  Component Value Date/Time   CHOL 127 10/07/2013  4:21 PM   Lipid Panel No results found for this basename: CHOL, TRIG, HDL, CHOLHDL, VLDL, LDLCALC,  in the last 72 hours  Studies/Results: Mr Virgel Paling Wo Contrast  10/19/2013   CLINICAL DATA:  Altered mental status.  Unable to communicate.  EXAM: MRI HEAD WITHOUT CONTRAST  MRA HEAD WITHOUT CONTRAST  TECHNIQUE: Multiplanar, multiecho pulse sequences of the brain and surrounding structures were obtained without intravenous contrast. Angiographic images of the head were obtained using MRA technique without contrast.  COMPARISON:  CT HEAD W/O CM dated 10/16/2013; CT HEAD W/O CM dated 10/01/2013; MR HEAD W/O CM dated 02/26/2013  FINDINGS: MRI HEAD FINDINGS  Severe atrophy and chronic microvascular ischemic change are re- demonstrated. Sequelae of remote lacunar infarction are re- demonstrated. Numerous small foci of chronic hemorrhage on susceptibility weighted imaging suggest Advanced hypertensive cerebral vascular disease; amyloid angiopathy less likely in the absence of large lobar hemorrhage. No midline shift. No tonsillar herniation. Mild cervical spondylosis. Flow voids are maintained throughout the carotid, basilar, and vertebral arteries. Visualized calvarium, skull base, and upper cervical osseous structures unremarkable. Scalp and extracranial soft tissues, orbits, sinuses, and mastoids show no acute process.  Compared with most recent prior MR,  the atrophy has progressed slightly. There is slight worsening of advanced chronic microvascular ischemic change as well. No new areas of mineralization/hemorrhage.  MRA HEAD FINDINGS  The internal carotid arteries demonstrate no flow reducing lesion or significant irregularity. Bilateral fetal PCA origins contribute to basilar hypoplasia. Right vertebral dominant. Left vertebral ends in PICA. No proximal stenosis of the middle or posterior cerebral arteries. Mild irregularity of the A1 ACA on the right but likely to be significant. No cerebellar branch occlusion. No intracranial aneurysm.  IMPRESSION: Progression of atrophy and small vessel disease. No acute intracranial findings.  No interval change in the appearance of multiple small foci of chronic hemorrhage.  No proximal significant flow reducing lesion is evident.   Electronically Signed   By: Rolla Flatten M.D.   On: 10/19/2013 17:44   Mr Brain Wo Contrast  10/19/2013   CLINICAL DATA:  Altered mental status.  Unable to communicate.  EXAM: MRI HEAD WITHOUT CONTRAST  MRA HEAD WITHOUT CONTRAST  TECHNIQUE: Multiplanar, multiecho pulse sequences of the brain and surrounding structures were obtained without intravenous contrast. Angiographic images of the head were obtained using MRA technique without contrast.  COMPARISON:  CT HEAD W/O CM dated 10/16/2013; CT HEAD W/O CM dated 10/01/2013; MR HEAD W/O CM dated 02/26/2013  FINDINGS: MRI HEAD FINDINGS  Severe atrophy and chronic microvascular ischemic change are re- demonstrated. Sequelae of remote lacunar infarction are re- demonstrated. Numerous small foci of chronic hemorrhage on susceptibility weighted imaging suggest Advanced hypertensive cerebral vascular disease; amyloid angiopathy less likely in the absence of large lobar hemorrhage. No midline shift. No tonsillar herniation. Mild cervical spondylosis. Flow voids are maintained throughout the carotid, basilar, and vertebral arteries. Visualized calvarium, skull  base, and upper cervical osseous structures unremarkable. Scalp and extracranial soft tissues, orbits, sinuses, and mastoids show no acute process.  Compared with most recent prior MR, the atrophy has progressed slightly. There is  slight worsening of advanced chronic microvascular ischemic change as well. No new areas of mineralization/hemorrhage.  MRA HEAD FINDINGS  The internal carotid arteries demonstrate no flow reducing lesion or significant irregularity. Bilateral fetal PCA origins contribute to basilar hypoplasia. Right vertebral dominant. Left vertebral ends in PICA. No proximal stenosis of the middle or posterior cerebral arteries. Mild irregularity of the A1 ACA on the right but likely to be significant. No cerebellar branch occlusion. No intracranial aneurysm.  IMPRESSION: Progression of atrophy and small vessel disease. No acute intracranial findings.  No interval change in the appearance of multiple small foci of chronic hemorrhage.  No proximal significant flow reducing lesion is evident.   Electronically Signed   By: Rolla Flatten M.D.   On: 10/19/2013 17:44   Mr Tibia Fibula Right Wo Contrast  10/19/2013   CLINICAL DATA:  Persistent fevers after revision of right lower extremity below knee amputation due to abscess.  EXAM: MRI OF LOWER RIGHT EXTREMITY WITHOUT CONTRAST  TECHNIQUE: Multiplanar, multisequence MR imaging was performed. No intravenous contrast was administered.  COMPARISON:  MRI dated 10/06/2013  FINDINGS: There is small hematoma in the posterior aspect of the stump measuring 6.6 x 3.0 x 4.0 cm. There is no evidence of an abscess. There is no osseous edema. There is a small right knee effusion which is unchanged. The adjacent muscle structures appear normal.  IMPRESSION: At and 1  1. No evidence of osteomyelitis or soft tissue abscess or cellulitis. 2. Small hematoma in the posterior aspect of the stump. 3. Small right knee effusion, unchanged since the prior exam and only minimally  more prominent than in the opposite knee.   Electronically Signed   By: Rozetta Nunnery M.D.   On: 10/19/2013 17:08    MEDICATIONS                                                                                                                       I have reviewed the patient's current medications.  ASSESSMENT/PLAN:                                                                                                            Encephalopathy, multifactorial. MRI brain negative. EEG was consistent with encephalopathy. No further neurological intervention needed. Will sign off.  Dorian Pod, MD [facility] [phone]  10/20/2013, 1:00 PM

## 2013-10-21 ENCOUNTER — Encounter (HOSPITAL_COMMUNITY): Payer: Self-pay | Admitting: Student

## 2013-10-21 LAB — CBC WITH DIFFERENTIAL/PLATELET
BASOS ABS: 0 10*3/uL (ref 0.0–0.1)
Basophils Relative: 0 % (ref 0–1)
EOS PCT: 2 % (ref 0–5)
Eosinophils Absolute: 0.2 10*3/uL (ref 0.0–0.7)
HCT: 30.4 % — ABNORMAL LOW (ref 39.0–52.0)
Hemoglobin: 9.9 g/dL — ABNORMAL LOW (ref 13.0–17.0)
LYMPHS ABS: 1.2 10*3/uL (ref 0.7–4.0)
Lymphocytes Relative: 15 % (ref 12–46)
MCH: 28.2 pg (ref 26.0–34.0)
MCHC: 32.6 g/dL (ref 30.0–36.0)
MCV: 86.6 fL (ref 78.0–100.0)
Monocytes Absolute: 0.9 10*3/uL (ref 0.1–1.0)
Monocytes Relative: 11 % (ref 3–12)
NEUTROS PCT: 72 % (ref 43–77)
Neutro Abs: 6 10*3/uL (ref 1.7–7.7)
PLATELETS: 534 10*3/uL — AB (ref 150–400)
RBC: 3.51 MIL/uL — AB (ref 4.22–5.81)
RDW: 20.7 % — AB (ref 11.5–15.5)
WBC: 8.3 10*3/uL (ref 4.0–10.5)

## 2013-10-21 LAB — COMPREHENSIVE METABOLIC PANEL
ALBUMIN: 2.4 g/dL — AB (ref 3.5–5.2)
ALK PHOS: 62 U/L (ref 39–117)
ALT: 14 U/L (ref 0–53)
AST: 24 U/L (ref 0–37)
BILIRUBIN TOTAL: 0.4 mg/dL (ref 0.3–1.2)
BUN: 39 mg/dL — AB (ref 6–23)
CHLORIDE: 92 meq/L — AB (ref 96–112)
CO2: 28 meq/L (ref 19–32)
Calcium: 9.8 mg/dL (ref 8.4–10.5)
Creatinine, Ser: 6.37 mg/dL — ABNORMAL HIGH (ref 0.50–1.35)
GFR calc Af Amer: 10 mL/min — ABNORMAL LOW (ref >90)
GFR, EST NON AFRICAN AMERICAN: 8 mL/min — AB (ref >90)
Glucose, Bld: 169 mg/dL — ABNORMAL HIGH (ref 70–99)
Potassium: 3.4 mEq/L — ABNORMAL LOW (ref 3.7–5.3)
Sodium: 140 mEq/L (ref 137–147)
Total Protein: 8.6 g/dL — ABNORMAL HIGH (ref 6.0–8.3)

## 2013-10-21 LAB — GLUCOSE, CAPILLARY
GLUCOSE-CAPILLARY: 141 mg/dL — AB (ref 70–99)
GLUCOSE-CAPILLARY: 159 mg/dL — AB (ref 70–99)
Glucose-Capillary: 183 mg/dL — ABNORMAL HIGH (ref 70–99)
Glucose-Capillary: 144 mg/dL — ABNORMAL HIGH (ref 70–99)
Glucose-Capillary: 160 mg/dL — ABNORMAL HIGH (ref 70–99)

## 2013-10-21 LAB — MAGNESIUM: MAGNESIUM: 2.3 mg/dL (ref 1.5–2.5)

## 2013-10-21 LAB — VANCOMYCIN, RANDOM: VANCOMYCIN RM: 22.1 ug/mL

## 2013-10-21 MED ORDER — VANCOMYCIN HCL IN DEXTROSE 750-5 MG/150ML-% IV SOLN
750.0000 mg | INTRAVENOUS | Status: DC
  Administered 2013-10-21: 750 mg via INTRAVENOUS
  Filled 2013-10-21: qty 150

## 2013-10-21 MED ORDER — MORPHINE SULFATE 2 MG/ML IJ SOLN
INTRAMUSCULAR | Status: AC
  Filled 2013-10-21: qty 1

## 2013-10-21 NOTE — Progress Notes (Signed)
Patient ID: Oscar Castillo, male   DOB: [birth year], 63 y.o.   MRN: [id number]  [facility] PROGRESS NOTE  Oscar Castillo KNL:[id number] DOB: [birth year] DOA: 10/01/2013 PCP: Oscar Cower, MD  Brief narrative:  63 year old male with HTN, HLD, CVA in 7902, combined systolic and diastolic CHF, diabetes type 1 controlled, vasculopath, S/P recent right BKA on 08/2013 (follows with Dr. Sharol Given), presented to [facility] ED with progressive failure to thrive, altered mental status. Upon arrival to ED, pt became unresponsive and hypoxic, requiring NRB, Hg ~16 (baseline ~9-10), Tmax 101 F, CXR c/w with RLL PNA.   EVENTS:  3/12 lethargy post HD, right BKA stump pain  3/17 post HD confusion   Assessment/Plan:  Encephalopathy, metabolic  - this is multifactorial and secondary to progressive nature of chronic illnesses, FTT and deconditioning, underlying infectious process  - pt alert but overall confused and not answering questions appropriately , minimally verbal  - appreciate neurology input but since acute events on MRI or CT head, neuro team has signed off 3/27 - MRI brain with no acute events and EEG with encephalopathy  New LBBB  - has been transient in nature  - systolic function normal but septal motion with abnormal function and dyssynergy  - cardiac enzymes negative  Dehydration /FTT (failure to thrive) in adult  - currently on tube feeds, pt tolerating well  - 3/25 patient pulled his NGT  Fever  - etiology unclear, ? RLL PNA, right BKA stump infection, pt currently on Vanc and Zosyn day #14 - will discontinue after today's dose  - Influenza PCR negative  - Blood cultures no growth to date urinalysis unremarkable,  - S/P abscess drainage/revision of right BKA stump on Wednesday 3/18  - pt afebrile over the past 24 hours  HYPERTENSION  - reasonable inpatient control  Anemia due to chronic illness  - Hg and Hct remain stable over the past 24 hours  DM (diabetes mellitus) type I controlled with  renal manifestation  - On Lantus prior to admission  - on SSI while inpatient until oral intake improves  CKD (chronic kidney disease) stage V requiring chronic dialysis  - appreciate nephrology input  - Usual dialysis days Tuesday Thursday Saturday Chronic combined systolic (EF 40%) and grade 2 diastolic congestive heart failure  - Compensated and managed with dialysis on T/Th/Sat  CEREBROVASCULAR ACCIDENT, HX OF  - No evidence of acute infarct  Right BKA and osteomyelitis of right BKA stump  - 3/14 MRI of right BKA stump shows abscess/osteomyelitis see results below and repeat MRI 3/26 indicated no abscess or osteo noted - on Vanc and Zosyn and will discontinue today  - Dr. Meridee Castillo (orthopedic surgeon) revised right BKA stump secondary abscess/osteomyelitis on 3/18  Severe malnutrition  - secondary to FTT - on tube feeds at this time   Consultants:  Dr. Roney Castillo Nephrology  Dr Oscar Castillo (Orthopedic Surgery)  Dr. Jean Castillo (orthopedic surgery)  Dr. Rhea Castillo (palliative care)  Radiology for lumbar puncture  Procedures:  Mr Brain Wo Contrast 10/19/2013 Progression of atrophy and small vessel disease. No acute intracranial findings. No interval change in the appearance of multiple small foci of chronic hemorrhage.  Mr Tibia Fibula Right Wo Contrast 10/19/2013 No evidence of osteomyelitis or soft tissue abscess or cellulitis. Small hematoma in the posterior aspect of the stump. Small right knee effusion, unchanged since the prior exam and only minimally more prominent than in the opposite knee.  Antibiotics:  Zosyn 3/14>>  Vancomycin (  full dose) 3/14>> 3/28 Aztreonam 3/08 >>> stopped 3/14  Vancomycin 3/08 >>> stopped 3/14  Code Status: Full  Family Communication: Wife at bedside  Disposition Plan: Remains inpatient   HPI/Subjective: No events overnight.   Objective: Filed Vitals:   10/21/13 1630 10/21/13 1700 10/21/13 1730 10/21/13 1825  BP: 99/71  95/60 120/78 148/86  Pulse: 99 100 98 97  Temp:    98.2 F (36.8 C)  TempSrc:    Oral  Resp:    18  Height:      Weight:    77 kg (169 lb 12.1 oz)  SpO2:    100%    Intake/Output Summary (Last 24 hours) at 10/21/13 1937 Last data filed at 10/21/13 1825  Gross per 24 hour  Intake    113 ml  Output    547 ml  Net   -434 ml    Exam: General: Pt is alert, follows commands appropriately, not in acute distress  Cardiovascular: Regular rate and rhythm, no rubs, no gallops  Respiratory: Clear to auscultation bilaterally, diminished breath sounds at bases  Abdomen: Soft, non tender, non distended, bowel sounds present, no guarding  Extremities: bilateral BKA  Data Reviewed: Basic Metabolic Panel:  Recent Labs Lab 10/17/13 0500 10/17/13 0758 10/18/13 0405 10/19/13 0546 10/19/13 1027 10/20/13 0636 10/21/13 0500  NA 137 138 136* 139  --  139 140  K 4.0 4.0 3.3* 3.7  --  3.9 3.4*  CL 89* 90* 92* 92*  --  93* 92*  CO2 25 26 27 25   --  27 28  GLUCOSE 145* 149* 155* 105*  --  104* 169*  BUN 44* 45* 24* 34*  --  22 39*  CREATININE 7.49* 7.52* 4.21* 6.19*  --  3.98* 6.37*  CALCIUM 9.6 9.7 9.6 10.0  --  9.6 9.8  MG 2.5  --  2.2 2.4  --  2.3 2.3  PHOS  --  7.1*  --   --  <0.5*  --   --    Liver Function Tests:  Recent Labs Lab 10/17/13 0500 10/17/13 0758 10/18/13 0405 10/19/13 0546 10/20/13 0636 10/21/13 0500  AST 20  --  19 22 35 24  ALT 10  --  11 10 16 14   ALKPHOS 55  --  57 58 66 62  BILITOT 0.4  --  0.4 0.5 0.5 0.4  PROT 8.3  --  8.6* 8.7* 9.4* 8.6*  ALBUMIN 2.3* 2.3* 2.3* 2.3* 2.5* 2.4*   CBC:  Recent Labs Lab 10/16/13 0540 10/17/13 0758 10/18/13 0405 10/19/13 0546 10/20/13 0636 10/21/13 0500  WBC 9.2 8.7 10.1 9.6 10.0 8.3  NEUTROABS 5.9  --  7.2 6.8 6.9 6.0  HGB 9.3* 8.7* 9.6* 9.7* 10.6* 9.9*  HCT 28.6* 27.3* 30.4* 29.9* 33.0* 30.4*  MCV 85.6 85.6 87.1 86.2 87.5 86.6  PLT 405* 390 404* 409* 501* 534*   CBG:  Recent Labs Lab 10/20/13 2016  10/21/13 0020 10/21/13 0451 10/21/13 0749 10/21/13 1125  GLUCAP 155* 159* 183* 144* 160*    Recent Results (from the past 240 hour(s))  CULTURE, BLOOD (ROUTINE X 2)     Status: None   Collection Time    10/14/13  3:45 PM      Result Value Ref Range Status   Specimen Description BLOOD RIGHT ARM   Final   Special Requests BOTTLES DRAWN AEROBIC ONLY 7 CC   Final   Culture  Setup Time     Final   Value: 10/14/2013  20:19     Performed at [facility]     Final   Value: NO GROWTH 5 DAYS     Performed at [facility]   Report Status 10/20/2013 FINAL   Final  CULTURE, BLOOD (ROUTINE X 2)     Status: None   Collection Time    10/14/13  3:50 PM      Result Value Ref Range Status   Specimen Description BLOOD RIGHT HAND   Final   Special Requests BOTTLES DRAWN AEROBIC ONLY 5 CC   Final   Culture  Setup Time     Final   Value: 10/14/2013 20:19     Performed at [facility]   Culture     Final   Value: NO GROWTH 5 DAYS     Performed at [facility]   Report Status 10/20/2013 FINAL   Final     Scheduled Meds: . bisacodyl  5 mg Oral BID  . darbepoetin (ARANESP) injection - DIALYSIS  25 mcg Intravenous Q Tue-HD  . feeding supplement (PRO-STAT SUGAR FREE 64)  30 mL Per Tube Q1200  . FLUoxetine  20 mg Per Tube Daily  . heparin  5,000 Units Subcutaneous 3 times per day  . insulin aspart  0-20 Units Subcutaneous 6 times per day  . labetalol  100 mg Oral BID  . morphine      . multivitamin  1 tablet Oral QHS  . neomycin-bacitracin-polymyxin   Topical TID  . ondansetron (ZOFRAN) IV  4 mg Intravenous 3 times per day  . piperacillin-tazobactam (ZOSYN)  IV  2.25 g Intravenous 3 times per day  . sodium chloride  3 mL Intravenous Q12H  . vancomycin  750 mg Intravenous Q T,Th,Sa-HD   Continuous Infusions: . dextrose 5 % and 0.9% NaCl 10 mL/hr at 10/21/13 0605  . feeding supplement (NEPRO CARB STEADY) 1,000 mL (10/20/13 2148)   Faye Ramsay,  MD  [facility] Pager [phone]  If 7PM-7AM, please contact night-coverage [email or web address] Password [facility] 10/21/2013, 7:37 PM   LOS: 20 days

## 2013-10-21 NOTE — Progress Notes (Signed)
ANTIBIOTIC CONSULT NOTE - FOLLOW UP  Pharmacy Consult for Vancomycin Indication:  R-BKA stump abscess/osteomyelitis  No Known Allergies  Patient Measurements: Height:  (bilateral amputee) Weight: 169 lb 12.1 oz (77 kg) IBW/kg (Calculated) : 40.8  Vital Signs: Temp: 98 F (36.7 C) (03/28 1415) Temp src: Oral (03/28 1415) BP: 99/71 mmHg (03/28 1630) Pulse Rate: 99 (03/28 1630) Intake/Output from previous day: 03/27 0701 - 03/28 0700 In: 110 [I.V.:110] Out: -  Intake/Output from this shift: Total I/O In: 3 [I.V.:3] Out: -   Labs:  Recent Labs  10/19/13 0546 10/20/13 0636 10/21/13 0500  WBC 9.6 10.0 8.3  HGB 9.7* 10.6* 9.9*  PLT 409* 501* 534*  CREATININE 6.19* 3.98* 6.37*   Estimated Creatinine Clearance: 9.4 ml/min (by C-G formula based on Cr of 6.37).  Recent Labs  10/21/13 1130  VANCORANDOM 22.1     Microbiology: Recent Results (from the past 720 hour(s))  CULTURE, BLOOD (ROUTINE X 2)     Status: None   Collection Time    10/01/13 11:35 AM      Result Value Ref Range Status   Specimen Description BLOOD RIGHT WRIST   Final   Special Requests BOTTLES DRAWN AEROBIC ONLY 10CC   Final   Culture  Setup Time     Final   Value: 10/01/2013 19:00     Performed at [facility]   Culture     Final   Value: NO GROWTH 5 DAYS     Performed at [facility]   Report Status 10/07/2013 FINAL   Final  CULTURE, BLOOD (ROUTINE X 2)     Status: None   Collection Time    10/01/13 12:01 PM      Result Value Ref Range Status   Specimen Description BLOOD RIGHT FOREARM   Final   Special Requests BOTTLES DRAWN AEROBIC AND ANAEROBIC 5CC   Final   Culture  Setup Time     Final   Value: 10/01/2013 18:59     Performed at [facility]   Culture     Final   Value: NO GROWTH 5 DAYS     Performed at [facility]   Report Status 10/07/2013 FINAL   Final  CSF CULTURE     Status: None   Collection Time    10/02/13 10:55 AM      Result Value Ref  Range Status   Specimen Description CSF   Final   Special Requests Normal   Final   Gram Stain     Final   Value: FEW WBC PRESENT, PREDOMINANTLY MONONUCLEAR     NO ORGANISMS SEEN     Performed at [facility]   Culture     Final   Value: NO GROWTH 3 DAYS     Performed at [facility]   Report Status 10/06/2013 FINAL   Final  GRAM STAIN     Status: None   Collection Time    10/02/13 10:55 AM      Result Value Ref Range Status   Specimen Description CSF   Final   Special Requests Normal   Final   Gram Stain     Final   Value: CYTOSPIN PREP     WBC PRESENT, PREDOMINANTLY MONONUCLEAR     NO ORGANISMS SEEN   Report Status 10/02/2013 FINAL   Final  MRSA PCR SCREENING     Status: None   Collection Time    10/02/13 11:18 PM  Result Value Ref Range Status   MRSA by PCR NEGATIVE  NEGATIVE Final   Comment:            The GeneXpert MRSA Assay (FDA     approved for NASAL specimens     only), is one component of a     comprehensive MRSA colonization     surveillance program. It is not     intended to diagnose MRSA     infection nor to guide or     monitor treatment for     MRSA infections.  CLOSTRIDIUM DIFFICILE BY PCR     Status: None   Collection Time    10/05/13  2:05 AM      Result Value Ref Range Status   C difficile by pcr NEGATIVE  NEGATIVE Final  CULTURE, BLOOD (ROUTINE X 2)     Status: None   Collection Time    10/05/13 10:42 PM      Result Value Ref Range Status   Specimen Description BLOOD RIGHT UPPER ARM   Final   Special Requests BOTTLES DRAWN AEROBIC AND ANAEROBIC 10CC   Final   Culture  Setup Time     Final   Value: 10/06/2013 03:56     Performed at [facility]   Culture     Final   Value: NO GROWTH 5 DAYS     Performed at [facility]   Report Status 10/12/2013 FINAL   Final  CULTURE, BLOOD (ROUTINE X 2)     Status: None   Collection Time    10/05/13 10:56 PM      Result Value Ref Range Status   Specimen Description  BLOOD RIGHT FOREARM   Final   Special Requests BOTTLES DRAWN AEROBIC AND ANAEROBIC 10CC   Final   Culture  Setup Time     Final   Value: 10/06/2013 03:56     Performed at [facility]   Culture     Final   Value: NO GROWTH 5 DAYS     Performed at [facility]   Report Status 10/12/2013 FINAL   Final  CULTURE, BLOOD (ROUTINE X 2)     Status: None   Collection Time    10/14/13  3:45 PM      Result Value Ref Range Status   Specimen Description BLOOD RIGHT ARM   Final   Special Requests BOTTLES DRAWN AEROBIC ONLY 7 CC   Final   Culture  Setup Time     Final   Value: 10/14/2013 20:19     Performed at [facility]   Culture     Final   Value: NO GROWTH 5 DAYS     Performed at [facility]   Report Status 10/20/2013 FINAL   Final  CULTURE, BLOOD (ROUTINE X 2)     Status: None   Collection Time    10/14/13  3:50 PM      Result Value Ref Range Status   Specimen Description BLOOD RIGHT HAND   Final   Special Requests BOTTLES DRAWN AEROBIC ONLY 5 CC   Final   Culture  Setup Time     Final   Value: 10/14/2013 20:19     Performed at [facility]   Culture     Final   Value: NO GROWTH 5 DAYS     Performed at [facility]   Report Status 10/20/2013 FINAL   Final    Anti-infectives  Start     Dose/Rate Route Frequency Ordered Stop   10/14/13 1600  piperacillin-tazobactam (ZOSYN) IVPB 2.25 g     2.25 g 100 mL/hr over 30 Minutes Intravenous 3 times per day 10/14/13 1455     10/14/13 1600  vancomycin (VANCOCIN) IVPB 1000 mg/200 mL premix     1,000 mg 200 mL/hr over 60 Minutes Intravenous Every T-Th-Sa (Hemodialysis) 10/14/13 1455     10/11/13 0600  vancomycin (VANCOCIN) IVPB 1000 mg/200 mL premix  Status:  Discontinued     1,000 mg 200 mL/hr over 60 Minutes Intravenous On call to O.R. 10/10/13 1432 10/11/13 1201   10/07/13 1415  piperacillin-tazobactam (ZOSYN) IVPB 2.25 g  Status:  Discontinued     2.25 g 100 mL/hr over 30 Minutes  Intravenous 3 times per day 10/07/13 1402 10/12/13 1217   10/03/13 1200  vancomycin (VANCOCIN) IVPB 750 mg/150 ml premix  Status:  Discontinued     750 mg 150 mL/hr over 60 Minutes Intravenous Every T-Th-Sa (Hemodialysis) 10/01/13 1158 10/12/13 1217   10/02/13 1200  aztreonam (AZACTAM) 1 g in dextrose 5 % 50 mL IVPB  Status:  Discontinued     1 g 100 mL/hr over 30 Minutes Intravenous Every 24 hours 10/01/13 1154 10/07/13 1401   10/01/13 1145  aztreonam (AZACTAM) 2 g in dextrose 5 % 50 mL IVPB     2 g 100 mL/hr over 30 Minutes Intravenous  Once 10/01/13 1131 10/01/13 1241   10/01/13 1145  vancomycin (VANCOCIN) IVPB 1000 mg/200 mL premix     1,000 mg 200 mL/hr over 60 Minutes Intravenous  Once 10/01/13 1131 10/10/13 1558      Assessment: 63 y.o. M on Vancomycin + Zosyn for R-BKA stump abscess/osteomyelitis s/p drainage/revision on 3/18. A pre-HD Vancomycin level today is therapeutic (Vanc level 22.1 mcg/ml, goal of 15-25 mcg/ml). Given the patient's reduced weight, will reduce dose slightly to avoid accumulation.   Goal of Therapy:  Pre-HD Vancomycin level of 15-25 mcg/ml  Plan:  1. Reduce post-HD Vancomycin dose slightly to 750 mg on T/Th/Sat 2. Continue Zosyn 2.25g IV every 8 hours 3. Will continue to follow HD schedule/duration, culture results, LOT, and antibiotic de-escalation plans   Alycia Rossetti, PharmD, BCPS Clinical Pharmacist Pager: [phone] 10/21/2013 5:14 PM

## 2013-10-21 NOTE — Progress Notes (Signed)
[facility] Progress Note  Subjective:   Minimally verbal this a.m. Does not follow commands.  C/o R stump pain when asked specifically.  Objective Filed Vitals:   10/20/13 1617 10/20/13 2029 10/21/13 0457 10/21/13 0755  BP: 128/87 142/85 127/76 128/78  Pulse: 88 88 85 97  Temp: 98.6 F (37 C) 98.8 F (37.1 C) 98.4 F (36.9 C) 98.9 F (37.2 C)  TempSrc:  Oral Oral Oral  Resp: _0 Height:      Weight:      SpO2: 98% 100% 98% 98%   Physical Exam General: Alert, flat affect, NAD Heart: RRR Lungs: Mostly clear, diminished at bases, no appreciable wheezes/rhonchi Abdomen: Soft, NT, + BS Extremities: Bilat BKAs, R stump wrapped, trace drainage. L stump with multiple scabbed ulcerations Dialysis Access: L AVF + bruit  [facility]  4h 17mn 72.5kg 2/2.0 Bath LUA AVF Heparin 5000 / 2400  Hectorol 1 Epo none Venofer none    Assessment/Plan: 1. AMS - no improvement; multifactorial= depression/ hx cva/ acute Illness/ repeat Head CT neg acute findings. MRI head/brain on 3/26 - no acute findings. Neuro has signed off  2. Fever - Tmax 98.9, WBC stable, CT abdomen with no acute abnormality, Blood Cx's neg. No osteo per #3. On Vanc and zosyn. ?narrow 3. Bleeding s/p R BKA revision 3/18 - MRI Rt tib/fib on 3/26 negative for abscess, osteo or cellulitis, but small hematoma and effusion present. Mgmt per ortho  4. ESRD - TTS - K 3.9. HD today with added K+ bath  5. Anemia - Hgb 10.6, improving. Continue Aranesp 25 Q Tuesday. Last Tsat 40 on 2/28. No Fe for now.  6. HTN/ volume - SBPs 120's on labetalol BID. Above edw. No overt excess. Bed wts highly variable; Weight changes not reflective of I/O. MBD - Corr Ca 10.8. Continue holding Vit D. Low phos, hold binder. Ca based only option for TF - use 2 Ca bath when K allows  7. Malnutrition - NG placed sec to poor po intake, TF - nepro 45/hr, prostat  8. Hep C + LFT ok  9. FTT/debiliatated/from SNF - Palliative care  met with pt's wife on 3/22, full code per family 149 Depression - seen by psych - liquid prozac started 3/23  11. Hypophosphatemia - from refeeding, < 0.5, no binders  KCollene Leyden WCletus Gash PA-C [facility] Pager [phone]/28/2015,9:20 AM  LOS: 20 days   Pt seen, examined and agree w A/P as above.  RKelly SplinterMD pager [phone]   cell [phone]/28/2015, 2:40 PM    Additional Objective Labs: Basic Metabolic Panel:  Recent Labs Lab 10/14/13 1659  10/17/13 0758 10/18/13 0405 10/19/13 0546 10/19/13 1027 10/20/13 0636  NA 143  < > 138 136* 139  --  139  K 4.8  < > 4.0 3.3* 3.7  --  3.9  CL 100  < > 90* 92* 92*  --  93*  CO2 26  < > _1 --  27  GLUCOSE 114*  < > 149* 155* 105*  --  104*  BUN 33*  < > 45* 24* 34*  --  22  CREATININE 7.01*  < > 7.52* 4.21* 6.19*  --  3.98*  CALCIUM 9.3  < > 9.7 9.6 10.0  --  9.6  PHOS 6.5*  --  7.1*  --   --  <0.5*  --   < > = values in this interval not  displayed. Liver Function Tests:  Recent Labs Lab 10/18/13 0405 10/19/13 0546 10/20/13 0636  AST 19 22 35  ALT _0 ALKPHOS 57 58 66  BILITOT 0.4 0.5 0.5  PROT 8.6* 8.7* 9.4*  ALBUMIN 2.3* 2.3* 2.5*    CBC:  Recent Labs Lab 10/16/13 0540 10/17/13 0758 10/18/13 0405 10/19/13 0546 10/20/13 0636  WBC 9.2 8.7 10.1 9.6 10.0  NEUTROABS 5.9  --  7.2 6.8 6.9  HGB 9.3* 8.7* 9.6* 9.7* 10.6*  HCT 28.6* 27.3* 30.4* 29.9* 33.0*  MCV 85.6 85.6 87.1 86.2 87.5  PLT 405* 390 404* 409* 501*   Blood Culture    Component Value Date/Time   SDES BLOOD RIGHT HAND 10/14/2013 1550   SPECREQUEST BOTTLES DRAWN AEROBIC ONLY 5 CC 10/14/2013 1550   CULT  Value: NO GROWTH 5 DAYS Performed at [facility] 10/14/2013 1550   REPTSTATUS 10/20/2013 FINAL 10/14/2013 1550    CBG:  Recent Labs Lab 10/20/13 1620 10/20/13 2016 10/21/13 0020 10/21/13 0451 10/21/13 0749  GLUCAP 162* 155* 159* 183* 144*   Studies/Results: Mr Jodene Nam Head Wo Contrast  10/19/2013    CLINICAL DATA:  Altered mental status.  Unable to communicate.  EXAM: MRI HEAD WITHOUT CONTRAST  MRA HEAD WITHOUT CONTRAST  TECHNIQUE: Multiplanar, multiecho pulse sequences of the brain and surrounding structures were obtained without intravenous contrast. Angiographic images of the head were obtained using MRA technique without contrast.  COMPARISON:  CT HEAD W/O CM dated 10/16/2013; CT HEAD W/O CM dated 10/01/2013; MR HEAD W/O CM dated 02/26/2013  FINDINGS: MRI HEAD FINDINGS  Severe atrophy and chronic microvascular ischemic change are re- demonstrated. Sequelae of remote lacunar infarction are re- demonstrated. Numerous small foci of chronic hemorrhage on susceptibility weighted imaging suggest Advanced hypertensive cerebral vascular disease; amyloid angiopathy less likely in the absence of large lobar hemorrhage. No midline shift. No tonsillar herniation. Mild cervical spondylosis. Flow voids are maintained throughout the carotid, basilar, and vertebral arteries. Visualized calvarium, skull base, and upper cervical osseous structures unremarkable. Scalp and extracranial soft tissues, orbits, sinuses, and mastoids show no acute process.  Compared with most recent prior MR, the atrophy has progressed slightly. There is slight worsening of advanced chronic microvascular ischemic change as well. No new areas of mineralization/hemorrhage.  MRA HEAD FINDINGS  The internal carotid arteries demonstrate no flow reducing lesion or significant irregularity. Bilateral fetal PCA origins contribute to basilar hypoplasia. Right vertebral dominant. Left vertebral ends in PICA. No proximal stenosis of the middle or posterior cerebral arteries. Mild irregularity of the A1 ACA on the right but likely to be significant. No cerebellar branch occlusion. No intracranial aneurysm.  IMPRESSION: Progression of atrophy and small vessel disease. No acute intracranial findings.  No interval change in the appearance of multiple small foci of  chronic hemorrhage.  No proximal significant flow reducing lesion is evident.   Electronically Signed   By: Rolla Flatten M.D.   On: 10/19/2013 17:44   Mr Brain Wo Contrast  10/19/2013   CLINICAL DATA:  Altered mental status.  Unable to communicate.  EXAM: MRI HEAD WITHOUT CONTRAST  MRA HEAD WITHOUT CONTRAST  TECHNIQUE: Multiplanar, multiecho pulse sequences of the brain and surrounding structures were obtained without intravenous contrast. Angiographic images of the head were obtained using MRA technique without contrast.  COMPARISON:  CT HEAD W/O CM dated 10/16/2013; CT HEAD W/O CM dated 10/01/2013; MR HEAD W/O CM dated 02/26/2013  FINDINGS: MRI HEAD FINDINGS  Severe atrophy and chronic microvascular ischemic  change are re- demonstrated. Sequelae of remote lacunar infarction are re- demonstrated. Numerous small foci of chronic hemorrhage on susceptibility weighted imaging suggest Advanced hypertensive cerebral vascular disease; amyloid angiopathy less likely in the absence of large lobar hemorrhage. No midline shift. No tonsillar herniation. Mild cervical spondylosis. Flow voids are maintained throughout the carotid, basilar, and vertebral arteries. Visualized calvarium, skull base, and upper cervical osseous structures unremarkable. Scalp and extracranial soft tissues, orbits, sinuses, and mastoids show no acute process.  Compared with most recent prior MR, the atrophy has progressed slightly. There is slight worsening of advanced chronic microvascular ischemic change as well. No new areas of mineralization/hemorrhage.  MRA HEAD FINDINGS  The internal carotid arteries demonstrate no flow reducing lesion or significant irregularity. Bilateral fetal PCA origins contribute to basilar hypoplasia. Right vertebral dominant. Left vertebral ends in PICA. No proximal stenosis of the middle or posterior cerebral arteries. Mild irregularity of the A1 ACA on the right but likely to be significant. No cerebellar branch  occlusion. No intracranial aneurysm.  IMPRESSION: Progression of atrophy and small vessel disease. No acute intracranial findings.  No interval change in the appearance of multiple small foci of chronic hemorrhage.  No proximal significant flow reducing lesion is evident.   Electronically Signed   By: Rolla Flatten M.D.   On: 10/19/2013 17:44   Mr Tibia Fibula Right Wo Contrast  10/19/2013   CLINICAL DATA:  Persistent fevers after revision of right lower extremity below knee amputation due to abscess.  EXAM: MRI OF LOWER RIGHT EXTREMITY WITHOUT CONTRAST  TECHNIQUE: Multiplanar, multisequence MR imaging was performed. No intravenous contrast was administered.  COMPARISON:  MRI dated 10/06/2013  FINDINGS: There is small hematoma in the posterior aspect of the stump measuring 6.6 x 3.0 x 4.0 cm. There is no evidence of an abscess. There is no osseous edema. There is a small right knee effusion which is unchanged. The adjacent muscle structures appear normal.  IMPRESSION: At and 1  1. No evidence of osteomyelitis or soft tissue abscess or cellulitis. 2. Small hematoma in the posterior aspect of the stump. 3. Small right knee effusion, unchanged since the prior exam and only minimally more prominent than in the opposite knee.   Electronically Signed   By: Rozetta Nunnery M.D.   On: 10/19/2013 17:08   Medications: . dextrose 5 % and 0.9% NaCl 10 mL/hr at 10/21/13 0605  . feeding supplement (NEPRO CARB STEADY) 1,000 mL (10/20/13 2148)   . bisacodyl  5 mg Oral BID  . darbepoetin (ARANESP) injection - DIALYSIS  25 mcg Intravenous Q Tue-HD  . feeding supplement (PRO-STAT SUGAR FREE 64)  30 mL Per Tube Q1200  . FLUoxetine  20 mg Per Tube Daily  . heparin  5,000 Units Subcutaneous 3 times per day  . insulin aspart  0-20 Units Subcutaneous 6 times per day  . labetalol  100 mg Oral BID  . multivitamin  1 tablet Oral QHS  . neomycin-bacitracin-polymyxin   Topical TID  . ondansetron (ZOFRAN) IV  4 mg Intravenous 3  times per day  . piperacillin-tazobactam (ZOSYN)  IV  2.25 g Intravenous 3 times per day  . sodium chloride  3 mL Intravenous Q12H  . vancomycin  1,000 mg Intravenous Q T,Th,Sa-HD

## 2013-10-22 ENCOUNTER — Inpatient Hospital Stay (HOSPITAL_COMMUNITY): Payer: Medicare Other

## 2013-10-22 LAB — CBC WITH DIFFERENTIAL/PLATELET
Basophils Absolute: 0 10*3/uL (ref 0.0–0.1)
Basophils Relative: 0 % (ref 0–1)
EOS ABS: 0.2 10*3/uL (ref 0.0–0.7)
Eosinophils Relative: 2 % (ref 0–5)
HCT: 32.8 % — ABNORMAL LOW (ref 39.0–52.0)
Hemoglobin: 10.5 g/dL — ABNORMAL LOW (ref 13.0–17.0)
LYMPHS PCT: 16 % (ref 12–46)
Lymphs Abs: 1.6 10*3/uL (ref 0.7–4.0)
MCH: 28.5 pg (ref 26.0–34.0)
MCHC: 32 g/dL (ref 30.0–36.0)
MCV: 89.1 fL (ref 78.0–100.0)
Monocytes Absolute: 0.7 10*3/uL (ref 0.1–1.0)
Monocytes Relative: 7 % (ref 3–12)
Neutro Abs: 7.3 10*3/uL (ref 1.7–7.7)
Neutrophils Relative %: 75 % (ref 43–77)
PLATELETS: 559 10*3/uL — AB (ref 150–400)
RBC: 3.68 MIL/uL — AB (ref 4.22–5.81)
RDW: 21.2 % — ABNORMAL HIGH (ref 11.5–15.5)
WBC: 9.8 10*3/uL (ref 4.0–10.5)

## 2013-10-22 LAB — COMPREHENSIVE METABOLIC PANEL
ALT: 16 U/L (ref 0–53)
AST: 33 U/L (ref 0–37)
Albumin: 2.3 g/dL — ABNORMAL LOW (ref 3.5–5.2)
Alkaline Phosphatase: 61 U/L (ref 39–117)
BUN: 17 mg/dL (ref 6–23)
CHLORIDE: 95 meq/L — AB (ref 96–112)
CO2: 28 meq/L (ref 19–32)
CREATININE: 3.55 mg/dL — AB (ref 0.50–1.35)
Calcium: 9.8 mg/dL (ref 8.4–10.5)
GFR calc Af Amer: 20 mL/min — ABNORMAL LOW (ref >90)
GFR, EST NON AFRICAN AMERICAN: 17 mL/min — AB (ref >90)
Glucose, Bld: 169 mg/dL — ABNORMAL HIGH (ref 70–99)
Potassium: 4 mEq/L (ref 3.7–5.3)
Sodium: 139 mEq/L (ref 137–147)
Total Bilirubin: 0.3 mg/dL (ref 0.3–1.2)
Total Protein: 8.8 g/dL — ABNORMAL HIGH (ref 6.0–8.3)

## 2013-10-22 LAB — GLUCOSE, CAPILLARY
GLUCOSE-CAPILLARY: 212 mg/dL — AB (ref 70–99)
Glucose-Capillary: 155 mg/dL — ABNORMAL HIGH (ref 70–99)
Glucose-Capillary: 165 mg/dL — ABNORMAL HIGH (ref 70–99)
Glucose-Capillary: 167 mg/dL — ABNORMAL HIGH (ref 70–99)
Glucose-Capillary: 167 mg/dL — ABNORMAL HIGH (ref 70–99)
Glucose-Capillary: 168 mg/dL — ABNORMAL HIGH (ref 70–99)

## 2013-10-22 LAB — MAGNESIUM: Magnesium: 2.3 mg/dL (ref 1.5–2.5)

## 2013-10-22 NOTE — Progress Notes (Signed)
Patient ID: Oscar Castillo, male   DOB: 18-Jun-1951, 63 y.o.   MRN: [id number]  [facility] PROGRESS NOTE  JERMALL ISAACSON AOZ:[id number] DOB: Nov 02, 1950 DOA: 10/01/2013 PCP: [name], MD  Brief narrative:  63 year old male with HTN, HLD, CVA in 9629, combined systolic and diastolic CHF, diabetes type 1 controlled, vasculopath, S/P recent right BKA on 08/2013 (follows with Dr. Sharol Given), presented to [facility] ED with progressive failure to thrive, altered mental status. Upon arrival to ED, pt became unresponsive and hypoxic, requiring NRB, Hg ~16 (baseline ~9-10), Tmax 101 F, CXR c/w with RLL PNA.   EVENTS:  3/12 lethargy post HD, right BKA stump pain  3/17 post HD confusion   Assessment/Plan:  Encephalopathy, metabolic  - this is multifactorial and secondary to progressive nature of chronic illnesses, FTT and deconditioning, underlying infectious process  - pt more alert but overall confused and answering some questions appropriately , minimally verbal  - appreciate neurology input but since acute events on MRI or CT head, neuro team has signed off 3/27  - MRI brain with no acute events and EEG with encephalopathy  New LBBB  - has been transient in nature  - systolic function normal but septal motion with abnormal function and dyssynergy  - cardiac enzymes negative  Dehydration /FTT (failure to thrive) in adult  - currently on tube feeds, pt tolerating well  - 3/25 patient pulled his NGT  Fever  - etiology unclear, ? RLL PNA, right BKA stump infection, pt currently on Vanc and Zosyn day #14  - will discontinue after today's dose  - Influenza PCR negative  - Blood cultures no growth to date urinalysis unremarkable,  - S/P abscess drainage/revision of right BKA stump on Wednesday 3/18  - pt with low grade fever 3/28, will order CXR to ensure no developing infiltrate  HYPERTENSION  - reasonable inpatient control  Anemia due to chronic illness  - Hg and Hct remain stable over the past 24 hours   DM (diabetes mellitus) type I controlled with renal manifestation  - On Lantus prior to admission  - on SSI while inpatient until oral intake improves  CKD (chronic kidney disease) stage V requiring chronic dialysis  - appreciate nephrology input  - Usual dialysis days Tuesday Thursday Saturday  Chronic combined systolic (EF 52%) and grade 2 diastolic congestive heart failure  - Compensated and managed with dialysis on T/Th/Sat  CEREBROVASCULAR ACCIDENT, HX OF  - No evidence of acute infarct  Right BKA and osteomyelitis of right BKA stump  - 3/14 MRI of right BKA stump shows abscess/osteomyelitis see results below and repeat MRI 3/26 indicated no abscess or osteo noted  - on Vanc and Zosyn and will discontinue today  - Dr. Meridee Score (orthopedic surgeon) revised right BKA stump secondary abscess/osteomyelitis on 3/18  Severe malnutrition  - secondary to FTT  - on tube feeds at this time   Consultants:  Dr. Roney Jaffe Nephrology  Dr Meridee Score (Orthopedic Surgery)  Dr. Jean Rosenthal (orthopedic surgery)  Dr. Rhea Pink (palliative care)  Radiology for lumbar puncture  Procedures:  Mr Brain Wo Contrast 10/19/2013 Progression of atrophy and small vessel disease. No acute intracranial findings. No interval change in the appearance of multiple small foci of chronic hemorrhage.  Mr Tibia Fibula Right Wo Contrast 10/19/2013 No evidence of osteomyelitis or soft tissue abscess or cellulitis. Small hematoma in the posterior aspect of the stump. Small right knee effusion, unchanged since the prior exam and only minimally more  prominent than in the opposite knee.  Antibiotics:  Zosyn 3/14>>  Vancomycin (full dose) 3/14>> 3/28  Aztreonam 3/08 >>> stopped 3/14  Vancomycin 3/08 >>> stopped 3/14  Code Status: Full  Family Communication: Wife at bedside  Disposition Plan: Remains inpatient     HPI/Subjective: No events overnight.   Objective: Filed Vitals:   10/21/13 2003  10/22/13 0413 10/22/13 0810 10/22/13 1325  BP: 127/80 134/70 130/64 124/64  Pulse: 93 98 95 92  Temp: 98.7 F (37.1 C) 98.4 F (36.9 C) 98 F (36.7 C) 98.2 F (36.8 C)  TempSrc: Oral Oral Oral Oral  Resp: 18 18 18 18   Height:      Weight:      SpO2: 98% 97% 98% 95%    Intake/Output Summary (Last 24 hours) at 10/22/13 1633 Last data filed at 10/22/13 1400  Gross per 24 hour  Intake  33.33 ml  Output    549 ml  Net -515.67 ml    Exam:   General:  Pt is more alert, follows some commands appropriately, minimally verbal   Cardiovascular: Regular rate and rhythm, S1/S2, no murmurs, no rubs, no gallops  Respiratory: Clear to auscultation bilaterally, no wheezing, diminished breath sounds at bases   Abdomen: Soft, non tender, non distended, bowel sounds present, no guarding  Data Reviewed: Basic Metabolic Panel:  Recent Labs Lab 10/17/13 0758 10/18/13 0405 10/19/13 0546 10/19/13 1027 10/20/13 0636 10/21/13 0500 10/22/13 0605  NA 138 136* 139  --  139 140 139  K 4.0 3.3* 3.7  --  3.9 3.4* 4.0  CL 90* 92* 92*  --  93* 92* 95*  CO2 26 27 25   --  27 28 28   GLUCOSE 149* 155* 105*  --  104* 169* 169*  BUN 45* 24* 34*  --  22 39* 17  CREATININE 7.52* 4.21* 6.19*  --  3.98* 6.37* 3.55*  CALCIUM 9.7 9.6 10.0  --  9.6 9.8 9.8  MG  --  2.2 2.4  --  2.3 2.3 2.3  PHOS 7.1*  --   --  <0.5*  --   --   --    Liver Function Tests:  Recent Labs Lab 10/18/13 0405 10/19/13 0546 10/20/13 0636 10/21/13 0500 10/22/13 0605  AST 19 22 35 24 33  ALT 11 10 16 14 16   ALKPHOS 57 58 66 62 61  BILITOT 0.4 0.5 0.5 0.4 0.3  PROT 8.6* 8.7* 9.4* 8.6* 8.8*  ALBUMIN 2.3* 2.3* 2.5* 2.4* 2.3*   No results found for this basename: LIPASE, AMYLASE,  in the last 168 hours No results found for this basename: AMMONIA,  in the last 168 hours CBC:  Recent Labs Lab 10/18/13 0405 10/19/13 0546 10/20/13 0636 10/21/13 0500 10/22/13 0605  WBC 10.1 9.6 10.0 8.3 9.8  NEUTROABS 7.2 6.8 6.9 6.0  7.3  HGB 9.6* 9.7* 10.6* 9.9* 10.5*  HCT 30.4* 29.9* 33.0* 30.4* 32.8*  MCV 87.1 86.2 87.5 86.6 89.1  PLT 404* 409* 501* 534* 559*   CBG:  Recent Labs Lab 10/21/13 2002 10/22/13 0009 10/22/13 0411 10/22/13 0742 10/22/13 1149  GLUCAP 141* 155* 168* 167* 165*    Recent Results (from the past 240 hour(s))  CULTURE, BLOOD (ROUTINE X 2)     Status: None   Collection Time    10/14/13  3:45 PM      Result Value Ref Range Status   Specimen Description BLOOD RIGHT ARM   Final   Special Requests BOTTLES DRAWN AEROBIC  ONLY 7 CC   Final   Culture  Setup Time     Final   Value: 10/14/2013 20:19     Performed at [facility]   Culture     Final   Value: NO GROWTH 5 DAYS     Performed at [facility]   Report Status 10/20/2013 FINAL   Final  CULTURE, BLOOD (ROUTINE X 2)     Status: None   Collection Time    10/14/13  3:50 PM      Result Value Ref Range Status   Specimen Description BLOOD RIGHT HAND   Final   Special Requests BOTTLES DRAWN AEROBIC ONLY 5 CC   Final   Culture  Setup Time     Final   Value: 10/14/2013 20:19     Performed at [facility]   Culture     Final   Value: NO GROWTH 5 DAYS     Performed at [facility]   Report Status 10/20/2013 FINAL   Final     Scheduled Meds: . bisacodyl  5 mg Oral BID  . darbepoetin (ARANESP) injection - DIALYSIS  25 mcg Intravenous Q Tue-HD  . feeding supplement (PRO-STAT SUGAR FREE 64)  30 mL Per Tube Q1200  . FLUoxetine  20 mg Per Tube Daily  . heparin  5,000 Units Subcutaneous 3 times per day  . insulin aspart  0-20 Units Subcutaneous 6 times per day  . labetalol  100 mg Oral BID  . multivitamin  1 tablet Oral QHS  . neomycin-bacitracin-polymyxin   Topical TID  . ondansetron (ZOFRAN) IV  4 mg Intravenous 3 times per day  . sodium chloride  3 mL Intravenous Q12H   Continuous Infusions: . dextrose 5 % and 0.9% NaCl 10 mL/hr at 10/21/13 1940  . feeding supplement (NEPRO CARB STEADY) 1,000 mL  (10/21/13 1948)     Faye Ramsay, MD  [facility] Pager [phone]  If 7PM-7AM, please contact night-coverage [email or web address] Password [facility] 10/22/2013, 4:33 PM   LOS: 21 days

## 2013-10-22 NOTE — Progress Notes (Deleted)
[facility] Progress Note  Subjective:   Minimally verbal this a.m. Does not follow commands.  C/o R stump pain when asked specifically.  Objective Filed Vitals:   10/21/13 1825 10/21/13 2003 10/22/13 0413 10/22/13 0810  BP: 148/86 127/80 134/70 130/64  Pulse: 97 93 98 95  Temp: 98.2 F (36.8 C) 98.7 F (37.1 C) 98.4 F (36.9 C) 98 F (36.7 C)  TempSrc: Oral Oral Oral Oral  Resp: _0 Height:      Weight: 77 kg (169 lb 12.1 oz)     SpO2: 100% 98% 97% 98%   Physical Exam General: Alert, flat affect, NAD Heart: RRR Lungs: Mostly clear, diminished at bases, no appreciable wheezes/rhonchi Abdomen: Soft, NT, + BS Extremities: Bilat BKAs, R stump wrapped, trace drainage. L stump with multiple scabbed ulcerations Dialysis Access: L AVF + bruit  [facility]  4h 100mn 72.5kg 2/2.0 Bath LUA AVF Heparin 5000 / 2400  Hectorol 1 Epo none Venofer none    Assessment/Plan: 1. AMS - no improvement; multifactorial= depression/ hx cva/ acute Illness/ repeat Head CT neg acute findings. MRI head/brain on 3/26 - no acute findings. Neuro has signed off  2. Fever - Tmax 98.9, WBC stable, CT abdomen with no acute abnormality, Blood Cx's neg. No osteo per #3. On Vanc and zosyn. ?narrow 3. Bleeding s/p R BKA revision 3/18 - MRI Rt tib/fib on 3/26 negative for abscess, osteo or cellulitis, but small hematoma and effusion present. Mgmt per ortho  4. ESRD - TTS - K 3.9. HD today with added K+ bath  5. Anemia - Hgb 10.6, improving. Continue Aranesp 25 Q Tuesday. Last Tsat 40 on 2/28. No Fe for now.  6. HTN/ volume - SBPs 120's on labetalol BID. Above edw. No overt excess. Bed wts highly variable; Weight changes not reflective of I/O. MBD - Corr Ca 10.8. Continue holding Vit D. Low phos, hold binder. Ca based only option for TF - use 2 Ca bath when K allows  7. Malnutrition - NG placed sec to poor po intake, TF - nepro 45/hr, prostat  8. Hep C + LFT ok   9. FTT/debiliatated/from SNF - Palliative care met with pt's wife on 3/22, full code per family 171 Depression - seen by psych - liquid prozac started 3/23  11. Hypophosphatemia - from refeeding, < 0.5, no binders  KCollene Leyden WCletus Gash PA-C [facility] Pager [phone]/29/2015,12:16 PM  LOS: 21 days   Pt seen, examined and agree w A/P as above. No new changes, wt's are prob not accurate, looks dry on exam and BP did not tolerate attempt to pull fluid last HD.  Getting TF's only.  RKelly SplinterMD pager [phone]   cell [phone] 02063/29/2015, 12:16 PM    Additional Objective Labs: Basic Metabolic Panel:  Recent Labs Lab 10/17/13 0758  10/19/13 1027 10/20/13 0636 10/21/13 0500 10/22/13 0605  NA 138  < >  --  139 140 139  K 4.0  < >  --  3.9 3.4* 4.0  CL 90*  < >  --  93* 92* 95*  CO2 26  < >  --  _1 GLUCOSE 149*  < >  --  104* 169* 169*  BUN 45*  < >  --  22 39* 17  CREATININE 7.52*  < >  --  3.98* 6.37* 3.55*  CALCIUM 9.7  < >  --  9.6 9.8 9.8  PHOS 7.1*  --  <  0.5*  --   --   --   < > = values in this interval not displayed. Liver Function Tests:  Recent Labs Lab 10/20/13 0636 10/21/13 0500 10/22/13 0605  AST 35 24 33  ALT _0 ALKPHOS 66 62 61  BILITOT 0.5 0.4 0.3  PROT 9.4* 8.6* 8.8*  ALBUMIN 2.5* 2.4* 2.3*    CBC:  Recent Labs Lab 10/18/13 0405 10/19/13 0546 10/20/13 0636 10/21/13 0500 10/22/13 0605  WBC 10.1 9.6 10.0 8.3 9.8  NEUTROABS 7.2 6.8 6.9 6.0 7.3  HGB 9.6* 9.7* 10.6* 9.9* 10.5*  HCT 30.4* 29.9* 33.0* 30.4* 32.8*  MCV 87.1 86.2 87.5 86.6 89.1  PLT 404* 409* 501* 534* 559*   Blood Culture    Component Value Date/Time   SDES BLOOD RIGHT HAND 10/14/2013 1550   SPECREQUEST BOTTLES DRAWN AEROBIC ONLY 5 CC 10/14/2013 1550   CULT  Value: NO GROWTH 5 DAYS Performed at [facility] 10/14/2013 1550   REPTSTATUS 10/20/2013 FINAL 10/14/2013 1550    CBG:  Recent Labs Lab 10/21/13 2002 10/22/13 0009  10/22/13 0411 10/22/13 0742 10/22/13 1149  GLUCAP 141* 155* 168* 167* 165*   Studies/Results: No results found. Medications: . dextrose 5 % and 0.9% NaCl 10 mL/hr at 10/21/13 1940  . feeding supplement (NEPRO CARB STEADY) 1,000 mL (10/21/13 1948)   . bisacodyl  5 mg Oral BID  . darbepoetin (ARANESP) injection - DIALYSIS  25 mcg Intravenous Q Tue-HD  . feeding supplement (PRO-STAT SUGAR FREE 64)  30 mL Per Tube Q1200  . FLUoxetine  20 mg Per Tube Daily  . heparin  5,000 Units Subcutaneous 3 times per day  . insulin aspart  0-20 Units Subcutaneous 6 times per day  . labetalol  100 mg Oral BID  . multivitamin  1 tablet Oral QHS  . neomycin-bacitracin-polymyxin   Topical TID  . ondansetron (ZOFRAN) IV  4 mg Intravenous 3 times per day  . sodium chloride  3 mL Intravenous Q12H

## 2013-10-22 NOTE — Progress Notes (Signed)
[facility] Progress Note  Subjective:   Pt asleep. Wife at bedside reports that he had a good night and did not c/o pain.  Objective Filed Vitals:   10/21/13 1825 10/21/13 2003 10/22/13 0413 10/22/13 0810  BP: 148/86 127/80 134/70 130/64  Pulse: 97 93 98 95  Temp: 98.2 F (36.8 C) 98.7 F (37.1 C) 98.4 F (36.9 C) 98 F (36.7 C)  TempSrc: Oral Oral Oral Oral  Resp: '18 18 18 18  ' Height:      Weight: 77 kg (169 lb 12.1 oz)     SpO2: 100% 98% 97% 98%   Physical Exam General: NAD Heart: RRR Lungs: CTA bilat, diminished at bases Abdomen: Soft, NT, + BS Extremities: Bilat BKAs, R stump wrapped/ no drainage. L stump with multiple scabbed ulcerations Dialysis Access: L AVF  bruit  [facility]  4h 75mn 72.5kg 2/2.0 Bath LUA AVF Heparin 5000 / 2400  Hectorol 1 Epo none Venofer none    Assessment/Plan: 1. AMS - no improvement; multifactorial= depression/ hx cva/ acute Illness/ repeat Head CT neg acute findings. MRI head/brain on 3/26 - no acute findings. Neuro has signed off  2. Fever - Tmax 98, WBC stable, CT abdomen with no acute abnormality, Blood Cx's neg. No osteo per #3. Off abx. 3. Bleeding s/p R BKA revision 3/18 - MRI Rt tib/fib on 3/26 negative for abscess, osteo or cellulitis, but small hematoma and effusion present. Mgmt per ortho  4. ESRD - TTS - K 4.0, Next HD Tuesday  5. Anemia - Hgb 10.5, improving. Continue Aranesp 25 Q Tuesday. Last Tsat 40 on 2/28. No Fe for now.  6. HTN/ volume - SBPs 130's on labetalol BID. Above edw. No overt excess. Bed wts highly variable; Wgt changes not reflective of I/O. Net UF 547cc yesterday. 7. MBD - Hypercalcemia worsening. Corr Ca 11.2, most likely from TF. (Ca based is only option) Continue holding Vit D. Low phos, hold binder. Use 2 Ca bath when K allows  8. Malnutrition - NG placed sec to poor po intake, TF - nepro 45/hr, prostat  9. Hep C + LFT ok  10. FTT/debiliatated/from SNF - Palliative care met with  pt's wife on 3/22, full code per family 169 Depression - seen by psych - liquid prozac started 3/23  12. Hypophosphatemia - from refeeding, < 0.5, no binders   KCollene Leyden WCletus Gash PA-C [facility] Pager [phone]/29/2015,10:14 AM  LOS: 21 days   Pt seen, examined and agree w A/P as above.  RKelly SplinterMD pager [phone]   cell [phone]/29/2015, 12:23 PM     Additional Objective Labs: Basic Metabolic Panel:  Recent Labs Lab 10/17/13 0758  10/19/13 1027 10/20/13 0636 10/21/13 0500 10/22/13 0605  NA 138  < >  --  139 140 139  K 4.0  < >  --  3.9 3.4* 4.0  CL 90*  < >  --  93* 92* 95*  CO2 26  < >  --  '27 28 28  ' GLUCOSE 149*  < >  --  104* 169* 169*  BUN 45*  < >  --  22 39* 17  CREATININE 7.52*  < >  --  3.98* 6.37* 3.55*  CALCIUM 9.7  < >  --  9.6 9.8 9.8  PHOS 7.1*  --  <0.5*  --   --   --   < > = values in this interval not displayed. Liver Function Tests:  Recent  Labs Lab 10/20/13 0636 10/21/13 0500 10/22/13 0605  AST 35 24 33  ALT '16 14 16  ' ALKPHOS 66 62 61  BILITOT 0.5 0.4 0.3  PROT 9.4* 8.6* 8.8*  ALBUMIN 2.5* 2.4* 2.3*   CBC:  Recent Labs Lab 10/18/13 0405 10/19/13 0546 10/20/13 0636 10/21/13 0500 10/22/13 0605  WBC 10.1 9.6 10.0 8.3 9.8  NEUTROABS 7.2 6.8 6.9 6.0 7.3  HGB 9.6* 9.7* 10.6* 9.9* 10.5*  HCT 30.4* 29.9* 33.0* 30.4* 32.8*  MCV 87.1 86.2 87.5 86.6 89.1  PLT 404* 409* 501* 534* 559*   Blood Culture    Component Value Date/Time   SDES BLOOD RIGHT HAND 10/14/2013 1550   SPECREQUEST BOTTLES DRAWN AEROBIC ONLY 5 CC 10/14/2013 1550   CULT  Value: NO GROWTH 5 DAYS Performed at [facility] 10/14/2013 1550   REPTSTATUS 10/20/2013 FINAL 10/14/2013 1550    CBG:  Recent Labs Lab 10/21/13 1125 10/21/13 2002 10/22/13 0009 10/22/13 0411 10/22/13 0742  GLUCAP 160* 141* 155* 168* 167*   Studies/Results: No results found.  Medications: . dextrose 5 % and 0.9% NaCl 10 mL/hr at 10/21/13 1940  . feeding  supplement (NEPRO CARB STEADY) 1,000 mL (10/21/13 1948)   . bisacodyl  5 mg Oral BID  . darbepoetin (ARANESP) injection - DIALYSIS  25 mcg Intravenous Q Tue-HD  . feeding supplement (PRO-STAT SUGAR FREE 64)  30 mL Per Tube Q1200  . FLUoxetine  20 mg Per Tube Daily  . heparin  5,000 Units Subcutaneous 3 times per day  . insulin aspart  0-20 Units Subcutaneous 6 times per day  . labetalol  100 mg Oral BID  . multivitamin  1 tablet Oral QHS  . neomycin-bacitracin-polymyxin   Topical TID  . ondansetron (ZOFRAN) IV  4 mg Intravenous 3 times per day  . sodium chloride  3 mL Intravenous Q12H

## 2013-10-23 ENCOUNTER — Inpatient Hospital Stay (HOSPITAL_COMMUNITY): Payer: Medicare Other

## 2013-10-23 LAB — MAGNESIUM: Magnesium: 2.4 mg/dL (ref 1.5–2.5)

## 2013-10-23 LAB — GLUCOSE, CAPILLARY
GLUCOSE-CAPILLARY: 174 mg/dL — AB (ref 70–99)
GLUCOSE-CAPILLARY: 159 mg/dL — AB (ref 70–99)
GLUCOSE-CAPILLARY: 191 mg/dL — AB (ref 70–99)
Glucose-Capillary: 161 mg/dL — ABNORMAL HIGH (ref 70–99)
Glucose-Capillary: 173 mg/dL — ABNORMAL HIGH (ref 70–99)
Glucose-Capillary: 191 mg/dL — ABNORMAL HIGH (ref 70–99)

## 2013-10-23 LAB — CBC WITH DIFFERENTIAL/PLATELET
BASOS PCT: 1 % (ref 0–1)
Basophils Absolute: 0.1 10*3/uL (ref 0.0–0.1)
Eosinophils Absolute: 0.3 10*3/uL (ref 0.0–0.7)
Eosinophils Relative: 3 % (ref 0–5)
HEMATOCRIT: 32.6 % — AB (ref 39.0–52.0)
HEMOGLOBIN: 10.1 g/dL — AB (ref 13.0–17.0)
Lymphocytes Relative: 19 % (ref 12–46)
Lymphs Abs: 2 10*3/uL (ref 0.7–4.0)
MCH: 28 pg (ref 26.0–34.0)
MCHC: 31 g/dL (ref 30.0–36.0)
MCV: 90.3 fL (ref 78.0–100.0)
MONO ABS: 1.1 10*3/uL — AB (ref 0.1–1.0)
Monocytes Relative: 10 % (ref 3–12)
NEUTROS ABS: 7 10*3/uL (ref 1.7–7.7)
Neutrophils Relative %: 67 % (ref 43–77)
Platelets: 568 10*3/uL — ABNORMAL HIGH (ref 150–400)
RBC: 3.61 MIL/uL — ABNORMAL LOW (ref 4.22–5.81)
RDW: 21.4 % — ABNORMAL HIGH (ref 11.5–15.5)
WBC: 10.5 10*3/uL (ref 4.0–10.5)

## 2013-10-23 LAB — COMPREHENSIVE METABOLIC PANEL
ALBUMIN: 2.3 g/dL — AB (ref 3.5–5.2)
ALT: 16 U/L (ref 0–53)
AST: 36 U/L (ref 0–37)
Alkaline Phosphatase: 63 U/L (ref 39–117)
BUN: 35 mg/dL — ABNORMAL HIGH (ref 6–23)
CALCIUM: 10 mg/dL (ref 8.4–10.5)
CO2: 27 meq/L (ref 19–32)
Chloride: 100 mEq/L (ref 96–112)
Creatinine, Ser: 5.72 mg/dL — ABNORMAL HIGH (ref 0.50–1.35)
GFR calc Af Amer: 11 mL/min — ABNORMAL LOW (ref >90)
GFR calc non Af Amer: 10 mL/min — ABNORMAL LOW (ref >90)
Glucose, Bld: 174 mg/dL — ABNORMAL HIGH (ref 70–99)
Potassium: 3.8 mEq/L (ref 3.7–5.3)
SODIUM: 144 meq/L (ref 137–147)
TOTAL PROTEIN: 8.7 g/dL — AB (ref 6.0–8.3)
Total Bilirubin: 0.3 mg/dL (ref 0.3–1.2)

## 2013-10-23 LAB — PROTIME-INR
INR: 1.02 (ref 0.00–1.49)
Prothrombin Time: 13.2 seconds (ref 11.6–15.2)

## 2013-10-23 LAB — APTT: APTT: 36 s (ref 24–37)

## 2013-10-23 MED ORDER — CEFAZOLIN SODIUM-DEXTROSE 2-3 GM-% IV SOLR
2.0000 g | Freq: Once | INTRAVENOUS | Status: DC
  Filled 2013-10-23 (×2): qty 50

## 2013-10-23 MED ORDER — LABETALOL HCL 200 MG PO TABS
200.0000 mg | ORAL_TABLET | Freq: Two times a day (BID) | ORAL | Status: DC
  Administered 2013-10-23 – 2013-10-31 (×15): 200 mg via ORAL
  Filled 2013-10-23 (×17): qty 1

## 2013-10-23 MED ORDER — LIDOCAINE VISCOUS 2 % MT SOLN
15.0000 mL | Freq: Once | OROMUCOSAL | Status: AC
  Administered 2013-10-23: 15 mL via OROMUCOSAL
  Filled 2013-10-23: qty 15

## 2013-10-23 MED ORDER — LABETALOL HCL 100 MG PO TABS
100.0000 mg | ORAL_TABLET | Freq: Once | ORAL | Status: AC
  Administered 2013-10-23: 100 mg via ORAL
  Filled 2013-10-23: qty 1

## 2013-10-23 NOTE — Progress Notes (Signed)
[facility] PROGRESS NOTE  Oscar Castillo [name] DOB: 06-22-1951 DOA: 10/01/2013 PCP: [name], MD  Assessment/Plan:  Encephalopathy, metabolic  -Suspect dehydration and possible underlying infectious processes as etiology; 3/13 patient's cognition appears to be clearing. Patient eating and answering simple questions  -3/17 [name], very confused however patient S/P dialysis today   -3/20 A/O x0, very confused; did receive Ativan last night and attempt to obtain MRI -3/20 A/O. Just returned from MRI -3/30 patient's continued encephalopathy not secondary to new CVA as previously discussed with family most likely multifactorial to include dialysis, bilateral BKA, diabetes, previous CVA, depression.  New LBBB  -has been transient in nature  -systolic function normal but septal motion with abnormal function and dyssynergy  -cardiac enzymes negative  -doubt pt appropriate for invasive cardiac work up   Dehydration /FTT (failure to thrive) in adult  -Continue IV fluid-type of fluid adjusted by nephrology today  - Speech and Language Pathologist (SLP) attempted 10/02/2013 the patient unable to arouse enough to eval but later re examined and now on D1 diet  -Patient has begun to eat his meals., However very cachectic continue ensure 3 times a day between meals.  -IV to [facility] except when giving meds -3/25 patient pulled his NG tube; will leave out an attempt to have patient more comfortable/less agitated in order to obtain MRI of head/right BKA stump  -3/26 replace NG tube resumed feedings  - 3/30 continued NG tube feedings have discussed with wife the need for placement since patient cognition has not improved to the point where he can have adequate by mouth intake on daily basis and she has agreed. -3/30 DC'd heparin will need to restart after placement of PEG tube in the a.m.  Fever  -Unclear etiology- suspect DH and atelectasis  -question of right lower lobe right but  currently is not hypoxic  -He did have leukocytosis so continue empiric antibiotics as precaution  -Patient unable to contribute to history and given altered mentation and fever an LP was done to rule out possible meningitis-CSF studies pending-partial results with 35 of protein and 156 of glucose so not indicative of bacterial meningitis-CSF culture no growth  -Influenza PCR negative  -Medication reconciliation list that patient was on doxycycline prior to admission which was started on 09/29/2013 or suspected postoperative wound infection/possible osteomyelitis and scheduled to go for 3 weeks  -Blood cultures no growth to date urinalysis unremarkable  -3/14 Right BKA stump shows abscess/osteomyelitis by MRI;; see results below  -3/17 patient remains afebrile;  -3/18 surgeon's S/P abscess drainage/revision of right BKA stump on Wednesday 3/18 -3/30 afebrile overnight      HYPERTENSION  -Although blood pressure not within AHA guidelines will allow permissive HTN given patient's waxing and waning cognitive state, poor perfusion secondary to severe PVD   -Continue hydralazine 5 mg PRN SBP> 160 or DBP> 100 -3/30 increase labetalol 200 mg BID   Anemia due to chronic illness  -Hemoglobin has trended down to 13 after hydration noting baseline just under 11  -3/17 Monitor closely currently at 9.5, repeat H./H.  -3/17 type and cross. With patient having revision in a.m. of right BKA will most likely need post surgical transfusion (would use hemoconcentrated PRBC) -3/26 stable -3/30 stable  DM (diabetes mellitus) type I controlled with renal manifestation  -On Lantus prior to admission -Patient has begun to eat at least partial to all portions of his meals. Being supplemented by Ensure between meals.    -Continue resistant SSI;  CBG currently controlled on this regimen   CKD (chronic kidney disease) stage V requiring chronic dialysis  -Usual dialysis days Tuesday Thursday Saturday  -No  ultrafiltration secondary to ongoing dehydration   Chronic combined systolic (EF 32%) and grade 2 diastolic congestive heart failure  -Compensated and managed with dialysis on T/Th/Sat  HEPATITIS C, HX OF  -Liver enzymes have been stable since patient's admission  CEREBROVASCULAR ACCIDENT, HX OF -No evidence of acute infarct  -3/26 MRI of brain noncontrast negative acute finding see report below  Hyperlipidemia -lipid panel; w/i NCEP guidelines  Right BKA -Pain is increasing since surgery in February 2015. MRI 3/13 shows pathology see results below -3/14 MRI of right BKA stump shows abscess/osteomyelitis see results below -3/15 stop much less tender today, negative discharge, fibrotic tissue along the skin closure. -3/16 right BKA stump more tender today on exam. -3/17 patient's right BKA stump nontender to palpation there   -3/25 right BKA stump revision appropriately tender -3/30 MRI right BKA stump negative for osteomyelitis see results below -3/30 negative  osteomyelitis by MRI on 3/26; see results below. Patient's incision site bleeding, seems to be generalized around the sutures/staples will apply dressing.   Osteomyelitis right BKA stump -Continue  Zosyn + vancomycin -3/15 seen by Dr. Jean Rosenthal (orthopedic surgery), round discuss abnormal findings of MRI right BKA stump and decide on course of action on 3/16 (debridement, stump revision?) -Continue morphine 1-2 mg q. 4 hours PRN severe pain in right BKA stump. -Dr. Meridee Score (orthopedic surgeon) revised right BKA stump secondary abscess/osteomyelitis on 3/18    Code Status: Full Family Communication:  Spoke with wife and discussed placement of PEG tube; wife agreed to procedure   Disposition Plan: Per notes, and wife continues to be full code, will need SNF following PEG placement   Consultants: Dr. Roney Jaffe Nephrology  Dr Meridee Score (Orthopedic Surgery) Dr. Jean Rosenthal (orthopedic  surgery) Dr. Rhea Pink (palliative care) Radiology for lumbar puncture   Procedures: 3/26/to 15 MRI right tibia/fibula without contrast 1. No evidence of osteomyelitis or soft tissue abscess or  cellulitis.  2. Small hematoma in the posterior aspect of the stump.  3. Small right knee effusion, unchanged since the prior exam and  only minimally more prominent than in the opposite knee.   10/19/2013 MR head without contrast Progression of atrophy and small vessel disease. No acute  intracranial findings.  No interval change in the appearance of multiple small foci of  chronic hemorrhage.  No proximal significant flow reducing lesion is evident.   10/11/2013 revision of right BKA secondary to Osteomyelitis/Abscess  MRI tib-fib right BKA stump 10/06/2013 Findings consistent with a focal abscess at the tip of the stumps of  the distal tibia and fibula.  Subtle edema at the tip of the stumps of the tibia and fibula most  likely represent focal osteomyelitis because the fluid collections,  which probably represent pus, are immediately adjacent to the bones.  but there is no discrete bone destruction.  Probable adjacent myositis/cellulitis.   CT head without contrast 10/01/2013 1. There is no evidence of an acute intracranial hemorrhage nor of  an evolving ischemic infarction.  2. There is stable diffuse atrophy with evidence of chronic small  vessel ischemic change.  3. There is no intracranial mass effect.  4. There is no evidence of an acute skull fracture.   Lumbar puncture by interventional radiology   Antibiotics: Zosyn 3/14 >> stopped 3/28 Vancomycin 3/14>> stopped 3/29 Aztreonam 3/08 >>>  stopped 3/14 Vancomycin 3/08 >>> stopped 3/14      HPI/Subjective: 63 year old male PMHx HTN, HLD, Hx diabetes type 1 controlled, vasculopath, S/P recent right BKA on 08/2013. He also has a prior history of CVA in 2009 and is on chronic hemodialysis. In addition he has combined  systolic& diastolic heart failure with an EF of 45%. According to the patient's wife he had been doing well in the postoperative period until around the same time he followed up with Dr. Sharol Given. At that time he started with poor intake and gradually increasing altered mentation. 09/24/2013 he was very confused when not work with the therapist. On 09/26/2013 a meeting was called at the nursing facility because he was experiencing extreme symptoms of failure to thrive and they were concerned the patient was depressed. Palliative care team had apparently been meeting with the family prior to admission. By 09/29/2013 he was a little more coherent and began trying to eat but later in the morning he would not take his meds kept his mouth shut.  Postoperatively he has had 2 courses of antibiotics for concerns of possible stump infection. In regards to dietary intake: in addition to the above the patient has been on a dysphagia diet with pured foods begun after the patient was noted to be coughing with attempts to eat.  Upon presentation to the emergency department EMS reported that the wife found food in the patient's mouth and he was unresponsive. His CBG was 379. He was hypoxemic and placed on a nonrebreather mask. His hemoglobin at presentation was 16.7 which was markedly elevated compared to prior readings between 9.9 and 11.5. He was also noted to have significant decreased breath sounds on the right without wheezing or crackles. The right BKA stump was unremarkable in appearance. Was also found to be mildly febrile with a rectal temperature of 101. Chest x-ray was concerning for possible right lower lobe pneumonia. 3/12 patient just returned from HD somewhat lethargic, complained of right BKA stump pain which has been increasing over several days 3/13 patient interacting with medical staff appropriately, able to participate in dressing change of right BKA 3/14 patient alert speaking in full sentences, sitting in bed  eating lunch. Writhing in pain and indicating pain is in his right BKA stump. 3/15 patient states the pain in his right BKA stump significantly decreased on the pain regimen he is on now. Negative N./V. states has been eating his meals today. States understands he has an infection in the right BKA stump and further surgery may be required. 3/16 patient able to answer some questions, and states understands the plan for right BKA stump revision.  Pain slightly increased over yesterday. 3/17 patient S/P HD, confused which is his baseline post HD 3/25 patient is lethargic however will follow some commands did cooperate with dressing change.3/26 patient arousable with extreme stimuli, stated negative SOB/CP NOTE; patient just returning from MRI, received some sedation 3/30 patient sleepy but arousable A./O. x1 (does not know when, where, why), states negative pain.   Objective: Filed Vitals:   10/22/13 2144 10/23/13 0620 10/23/13 0744 10/23/13 1151  BP: 152/92 128/75 146/70 199/67  Pulse: 96 88 88 91  Temp: 99.5 F (37.5 C) 98.8 F (37.1 C) 97.5 F (36.4 C) 97.7 F (36.5 C)  TempSrc: Oral Oral Oral Oral  Resp: 18 18 18 20   Height:      Weight: 76 kg (167 lb 8.8 oz)     SpO2: 100% 100% 100% 100%  Intake/Output Summary (Last 24 hours) at 10/23/13 1230 Last data filed at 10/23/13 0724  Gross per 24 hour  Intake 726.83 ml  Output      6 ml  Net 720.83 ml   Filed Weights   10/21/13 1415 10/21/13 1825 10/22/13 2144  Weight: 77 kg (169 lb 12.1 oz) 77 kg (169 lb 12.1 oz) 76 kg (167 lb 8.8 oz)    Exam:   General:  A./O. x1 , (does not know when/where/why ) does follow commands does not follow commands,    Cardiovascular: Regular rhythm and rate, negative murmurs rubs gallop  Respiratory: Clear to auscultation bilateral  Abdomen: Soft, nontender, nondistended plus bowel sound  Musculoskeletal: Bilateral BKA, left BKA performed November 2014 negative tenderness to palpation; right BKA  performed February 2015/with revision 10/11/2013, bleeding around incision site, nontender to palpation, Staples and sutures intact.      Data Reviewed: Basic Metabolic Panel:  Recent Labs Lab 10/17/13 0758  10/19/13 0546 10/19/13 1027 10/20/13 0636 10/21/13 0500 10/22/13 0605 10/23/13 0634  NA 138  < > 139  --  139 140 139 144  K 4.0  < > 3.7  --  3.9 3.4* 4.0 3.8  CL 90*  < > 92*  --  93* 92* 95* 100  CO2 26  < > 25  --  27 28 28 27   GLUCOSE 149*  < > 105*  --  104* 169* 169* 174*  BUN 45*  < > 34*  --  22 39* 17 35*  CREATININE 7.52*  < > 6.19*  --  3.98* 6.37* 3.55* 5.72*  CALCIUM 9.7  < > 10.0  --  9.6 9.8 9.8 10.0  MG  --   < > 2.4  --  2.3 2.3 2.3 2.4  PHOS 7.1*  --   --  <0.5*  --   --   --   --   < > = values in this interval not displayed. Liver Function Tests:  Recent Labs Lab 10/19/13 0546 10/20/13 0636 10/21/13 0500 10/22/13 0605 10/23/13 0634  AST 22 35 24 33 36  ALT 10 16 14 16 16   ALKPHOS 58 66 62 61 63  BILITOT 0.5 0.5 0.4 0.3 0.3  PROT 8.7* 9.4* 8.6* 8.8* 8.7*  ALBUMIN 2.3* 2.5* 2.4* 2.3* 2.3*   No results found for this basename: LIPASE, AMYLASE,  in the last 168 hours No results found for this basename: AMMONIA,  in the last 168 hours CBC:  Recent Labs Lab 10/19/13 0546 10/20/13 0636 10/21/13 0500 10/22/13 0605 10/23/13 0634  WBC 9.6 10.0 8.3 9.8 10.5  NEUTROABS 6.8 6.9 6.0 7.3 7.0  HGB 9.7* 10.6* 9.9* 10.5* 10.1*  HCT 29.9* 33.0* 30.4* 32.8* 32.6*  MCV 86.2 87.5 86.6 89.1 90.3  PLT 409* 501* 534* 559* 568*   Cardiac Enzymes: No results found for this basename: CKTOTAL, CKMB, CKMBINDEX, TROPONINI,  in the last 168 hours BNP (last 3 results)  Recent Labs  10/01/13 1135  PROBNP 1397.0*   CBG:  Recent Labs Lab 10/22/13 1933 10/22/13 2353 10/23/13 0400 10/23/13 0739 10/23/13 1146  GLUCAP 167* 161* 174* 159* 191*    Recent Results (from the past 240 hour(s))  CULTURE, BLOOD (ROUTINE X 2)     Status: None   Collection  Time    10/14/13  3:45 PM      Result Value Ref Range Status   Specimen Description BLOOD RIGHT ARM   Final   Special Requests BOTTLES DRAWN  AEROBIC ONLY 7 CC   Final   Culture  Setup Time     Final   Value: 10/14/2013 20:19     Performed at [facility]   Culture     Final   Value: NO GROWTH 5 DAYS     Performed at [facility]   Report Status 10/20/2013 FINAL   Final  CULTURE, BLOOD (ROUTINE X 2)     Status: None   Collection Time    10/14/13  3:50 PM      Result Value Ref Range Status   Specimen Description BLOOD RIGHT HAND   Final   Special Requests BOTTLES DRAWN AEROBIC ONLY 5 CC   Final   Culture  Setup Time     Final   Value: 10/14/2013 20:19     Performed at [facility]   Culture     Final   Value: NO GROWTH 5 DAYS     Performed at [facility]   Report Status 10/20/2013 FINAL   Final  CULTURE, BLOOD (ROUTINE X 2)     Status: None   Collection Time    10/22/13  1:10 PM      Result Value Ref Range Status   Specimen Description BLOOD RIGHT HAND   Final   Special Requests BOTTLES DRAWN AEROBIC ONLY 3CC   Final   Culture  Setup Time     Final   Value: 10/22/2013 21:20     Performed at [facility]   Culture     Final   Value:        BLOOD CULTURE RECEIVED NO GROWTH TO DATE CULTURE WILL BE HELD FOR 5 DAYS BEFORE ISSUING A FINAL NEGATIVE REPORT     Performed at [facility]   Report Status PENDING   Incomplete     Studies: Dg Chest Port 1 View  10/22/2013   CLINICAL DATA:  Fever.  EXAM: PORTABLE CHEST - 1 VIEW  COMPARISON:  09/2013  FINDINGS: A nasogastric tube has been placed since the prior study. It extends into the proximal stomach.  Lungs are clear.  No pleural effusion or pneumothorax.  Heart, mediastinum hila are unremarkable.  IMPRESSION: No acute cardiopulmonary disease.   Electronically Signed   By: Lajean Manes M.D.   On: 10/22/2013 14:30    Scheduled Meds: . bisacodyl  5 mg Oral BID  . darbepoetin (ARANESP)  injection - DIALYSIS  25 mcg Intravenous Q Tue-HD  . feeding supplement (PRO-STAT SUGAR FREE 64)  30 mL Per Tube Q1200  . FLUoxetine  20 mg Per Tube Daily  . heparin  5,000 Units Subcutaneous 3 times per day  . insulin aspart  0-20 Units Subcutaneous 6 times per day  . labetalol  100 mg Oral BID  . multivitamin  1 tablet Oral QHS  . neomycin-bacitracin-polymyxin   Topical TID  . ondansetron (ZOFRAN) IV  4 mg Intravenous 3 times per day  . sodium chloride  3 mL Intravenous Q12H   Continuous Infusions: . dextrose 5 % and 0.9% NaCl 10 mL/hr at 10/21/13 1940  . feeding supplement (NEPRO CARB STEADY) 1,000 mL (10/22/13 1945)    Active Problems:   HYPERTENSION   HEPATITIS C, HX OF   CEREBROVASCULAR ACCIDENT, HX OF   BENIGN PROSTATIC HYPERTROPHY   Hyperlipidemia   Anemia due to chronic illness   DM (diabetes mellitus) type I controlled with renal manifestation   Chronic combined systolic (EF [id number]) and grade 2diastolic congestive  heart failure   FTT (failure to thrive) in adult   Encephalopathy, metabolic   Dehydration   Fever    Time spent:40 minutes    Trezure Cronk, J  [facility] Pager [phone]. If 7PM-7AM, please contact night-coverage at [email or web address], password [facility] 10/23/2013, 12:30 PM  LOS: 22 days

## 2013-10-23 NOTE — Progress Notes (Signed)
Patient ID: Oscar Castillo, male   DOB: 09/06/1950, 63 y.o.   MRN: [id number]   [facility] Progress Note   Assessment/ Plan:   1. AMS - Sluggish improvement. The etiology appears multifactorial  (depression/ hx cva/metabolic encephalopathy) Imaging of brain - no acute findings.   2. Bleeding s/p R BKA revision 3/18 - MRI Rt tib/fib on 3/26 negative for abscess, osteo or cellulitis, but small hematoma and effusion present. Mgmt per ortho  3. ESRD - TTS - K 4.0, Next HD Tomorrow- the only fluid input is through TFs and medications 4. Anemia - Hgb 10.5, improving. Continue Aranesp 25 Q Tuesday. Last Tsat 40 on 2/28. No Fe for now.  5. HTN/ volume - SBPs 130's on labetalol BID. Above edw. No overt excess.  6. MBD - Hypercalcemia worsening. Corr Ca 11.2, most likely from TF. (Ca based is only option) Continue holding Vit D. Low phos, hold binder. Use 2 Ca bath when K allows  7. Malnutrition - NG placed sec to poor po intake, TF - nepro 45/hr, prostat  8. Hep C + LFT ok  9. FTT/debiliatated/from SNF - Palliative care met with pt's wife on 3/22, full code per family 10. Depression - seen by psych - liquid prozac started 3/23   Subjective:   No acute events overnight- patient does not voice complaints   Objective:   BP 146/70  Pulse 88  Temp(Src) 97.5 F (36.4 C) (Oral)  Resp 18  Ht 4' 8" (1.422 m)  Wt 76 kg (167 lb 8.8 oz)  BMI 37.58 kg/m2  SpO2 100%  Physical Exam: Gen:Somnolent on TFs CVS:Pulse RRR, S1 and S2 with ESM  Resp:Coarse BS, no rales/rhonchi Abd:soft, obese, NT, BS normal Ext:Bilateral BKA- left stump appears ulcerated and right one in dressing  Labs: BMET  Recent Labs Lab 10/17/13 0758 10/18/13 0405 10/19/13 0546 10/19/13 1027 10/20/13 0636 10/21/13 0500 10/22/13 0605 10/23/13 0634  NA 138 136* 139  --  139 140 139 144  K 4.0 3.3* 3.7  --  3.9 3.4* 4.0 3.8  CL 90* 92* 92*  --  93* 92* 95* 100  CO2 26 27 25  --  27 28 28 27  GLUCOSE 149* 155*  105*  --  104* 169* 169* 174*  BUN 45* 24* 34*  --  22 39* 17 35*  CREATININE 7.52* 4.21* 6.19*  --  3.98* 6.37* 3.55* 5.72*  CALCIUM 9.7 9.6 10.0  --  9.6 9.8 9.8 10.0  PHOS 7.1*  --   --  <0.5*  --   --   --   --    CBC  Recent Labs Lab 10/20/13 0636 10/21/13 0500 10/22/13 0605 10/23/13 0634  WBC 10.0 8.3 9.8 10.5  NEUTROABS 6.9 6.0 7.3 7.0  HGB 10.6* 9.9* 10.5* 10.1*  HCT 33.0* 30.4* 32.8* 32.6*  MCV 87.5 86.6 89.1 90.3  PLT 501* 534* 559* 568*   Medications:    . bisacodyl  5 mg Oral BID  . darbepoetin (ARANESP) injection - DIALYSIS  25 mcg Intravenous Q Tue-HD  . feeding supplement (PRO-STAT SUGAR FREE 64)  30 mL Per Tube Q1200  . FLUoxetine  20 mg Per Tube Daily  . heparin  5,000 Units Subcutaneous 3 times per day  . insulin aspart  0-20 Units Subcutaneous 6 times per day  . labetalol  100 mg Oral BID  . multivitamin  1 tablet Oral QHS  . neomycin-bacitracin-polymyxin   Topical TID  . ondansetron (ZOFRAN)   IV  4 mg Intravenous 3 times per day  . sodium chloride  3 mL Intravenous Q12H    , MD 10/23/2013, 9:59 AM   

## 2013-10-23 NOTE — H&P (Signed)
Oscar Castillo is an 63 y.o. male.   Chief Complaint: pt admitted with fever; AMS Infection/abscess of Rt BKA- treated x 14 days Encephalopathy; FTT Dehydration Existing NG--need long term care Request for percutaneous Gastric tube placement  HPI: B BKA- revision/I/D of Rt BKA; ESRD; CVA 2009; CHF; DM; Hep Cl; HTN  Past Medical History  Diagnosis Date  . ESRD on hemodialysis 05/05/2007    ESRD due to DM/HTN. Started dialysis in November 2013.  HD TTS at [facility] on [place].  [name] BACK PAIN, LUMBAR, CHRONIC 08/06/2009  . BENIGN PROSTATIC HYPERTROPHY 08/01/2010  . CEREBROVASCULAR ACCIDENT, HX OF 08/06/2009  . CHOLELITHIASIS 08/01/2010  . CONGESTIVE HEART FAILURE 03/18/2009  . DEPRESSION 03/18/2009  . DIABETES MELLITUS, TYPE II 03/25/2007  . ERECTILE DYSFUNCTION 03/25/2007  . GERD 03/25/2007  . HEPATITIS C, HX OF 03/25/2007  . HYPERTENSION 03/25/2007  . Morbid obesity 03/25/2007  . NEPHROLITHIASIS, HX OF 03/18/2009  . Complication of anesthesia     wife states pt had trouble waking up with his last surgery in Nov., 2014    Past Surgical History  Procedure Laterality Date  . Nephrectomy      partial RR  . Av fistula placement  06/14/2012    Procedure: ARTERIOVENOUS (AV) FISTULA CREATION;  Surgeon: Angelia Mould, MD;  Location: [facility] OR;  Service: Vascular;  Laterality: Left;  Left basilic vein transposition with fistula.  . Tibia im nail insertion Left 09/09/2012    Procedure: INTRAMEDULLARY (IM) NAIL TIBIAL;  Surgeon: Johnny Bridge, MD;  Location: [place];  Service: Orthopedics;  Laterality: Left;  left tibial nail and open reduction internal fixation left fibula fracture  . Orif fibula fracture Left 09/09/2012    Procedure: OPEN REDUCTION INTERNAL FIXATION (ORIF) FIBULA FRACTURE;  Surgeon: Johnny Bridge, MD;  Location: [place];  Service: Orthopedics;  Laterality: Left;  . Colonoscopy N/A 10/28/2012    Procedure: COLONOSCOPY;  Surgeon: Jeryl Columbia, MD;  Location: [facility] ENDOSCOPY;  Service:  Endoscopy;  Laterality: N/A;  . Esophagogastroduodenoscopy N/A 11/02/2012    Procedure: ESOPHAGOGASTRODUODENOSCOPY (EGD);  Surgeon: Cleotis Nipper, MD;  Location: [facility] ENDOSCOPY;  Service: Endoscopy;  Laterality: N/A;  . Colonoscopy N/A 11/02/2012    Procedure: COLONOSCOPY;  Surgeon: Cleotis Nipper, MD;  Location: [facility] ENDOSCOPY;  Service: Endoscopy;  Laterality: N/A;  . Colonoscopy N/A 11/03/2012    Procedure: COLONOSCOPY;  Surgeon: Cleotis Nipper, MD;  Location: [facility] ENDOSCOPY;  Service: Endoscopy;  Laterality: N/A;  . Givens capsule study N/A 11/04/2012    Procedure: GIVENS CAPSULE STUDY;  Surgeon: Cleotis Nipper, MD;  Location: [facility] ENDOSCOPY;  Service: Endoscopy;  Laterality: N/A;  . Enteroscopy N/A 11/08/2012    Procedure: ENTEROSCOPY;  Surgeon: Wonda Horner, MD;  Location: [facility] ENDOSCOPY;  Service: Endoscopy;  Laterality: N/A;  . Amputation Left 05/12/2013    Procedure: AMPUTATION RAY;  Surgeon: Newt Minion, MD;  Location: [place];  Service: Orthopedics;  Laterality: Left;  Left Foot 1st Ray Amputation  . Eye surgery Left     to remove scar tissue  . Amputation Left 06/09/2013    Procedure: AMPUTATION BELOW KNEE;  Surgeon: Newt Minion, MD;  Location: [place];  Service: Orthopedics;  Laterality: Left;  Left Below Knee Amputation and removal proximal screws IM tibial nail  . Hardware removal Left 06/09/2013    Procedure: HARDWARE REMOVAL;  Surgeon: Newt Minion, MD;  Location: [place];  Service: Orthopedics;  Laterality: Left;  Left Below  Knee Amputation  and Removal proximal screws IM tibial nail  . Amputation Right 09/08/2013    Procedure: AMPUTATION BELOW KNEE;  Surgeon: Newt Minion, MD;  Location: [place];  Service: Orthopedics;  Laterality: Right;  Right Below Knee Amputation  . Amputation Right 10/11/2013    Procedure: AMPUTATION BELOW KNEE;  Surgeon: Newt Minion, MD;  Location: [place];  Service: Orthopedics;  Laterality: Right;  Right Below Knee Amputation Revision    Family History   Problem Relation Age of Onset  . Coronary artery disease Other   . Diabetes Other    Social History:  reports that he has never smoked. He has never used smokeless tobacco. He reports that he does not drink alcohol or use illicit drugs.  Allergies: No Known Allergies  Medications Prior to Admission  Medication Sig Dispense Refill  . acetaminophen (TYLENOL) 500 MG tablet Take 500 mg by mouth every 6 (six) hours as needed for mild pain.      [name] amLODipine (NORVASC) 10 MG tablet Take 10 mg by mouth daily.      . Ascorbic Acid (VITAMIN C PO) Take 1 tablet by mouth daily.      . brimonidine (ALPHAGAN) 0.2 % ophthalmic solution Place 1 drop into both eyes 2 (two) times daily.      . Brinzolamide-Brimonidine (SIMBRINZA) 1-0.2 % SUSP Place 1 drop into both eyes 2 (two) times daily.       . calcium acetate (PHOSLO) 667 MG capsule Take 2 capsules (1,334 mg total) by mouth 3 (three) times daily with meals.  180 capsule  0  . docusate sodium (COLACE) 100 MG capsule Take 1 capsule (100 mg total) by mouth 2 (two) times daily.  10 capsule  0  . doxycycline (ADOXA) 100 MG tablet Take 100 mg by mouth 2 (two) times daily. Started on 09/29/13 X 3 weeks      . gabapentin (NEURONTIN) 300 MG capsule Take 300 mg by mouth daily as needed (for mild to moderate pain).       . haloperidol (HALDOL) 5 MG tablet Take 5 mg by mouth at bedtime as needed for agitation.      [name] HYDROcodone-acetaminophen (NORCO) 7.5-325 MG per tablet Take 1 tablet by mouth every 6 (six) hours as needed for moderate pain.  120 tablet  0  . Insulin Glargine (LANTUS SOLOSTAR O'Brien) Inject 5 Units into the skin every evening.      . labetalol (NORMODYNE) 300 MG tablet Take 1 tablet (300 mg total) by mouth 3 (three) times daily.  90 tablet  6  . saccharomyces boulardii (FLORASTOR) 250 MG capsule Take 250 mg by mouth 2 (two) times daily.      . sertraline (ZOLOFT) 100 MG tablet Take 100 mg by mouth daily as needed. For depression      . silver  sulfADIAZINE (SILVADENE) 1 % cream Apply 1 application topically daily.      . Travoprost, BAK Free, (TRAVATAN) 0.004 % SOLN ophthalmic solution Place 1 drop into both eyes at bedtime.  1 Bottle  0  . traZODone (DESYREL) 50 MG tablet Take 50 mg by mouth at bedtime as needed for sleep.        Results for orders placed during the hospital encounter of 10/01/13 (from the past 48 hour(s))  GLUCOSE, CAPILLARY     Status: Abnormal   Collection Time    10/21/13  8:02 PM      Result Value Ref Range   Glucose-Capillary 141 (*)  70 - 99 mg/dL  GLUCOSE, CAPILLARY     Status: Abnormal   Collection Time    10/22/13 12:09 AM      Result Value Ref Range   Glucose-Capillary 155 (*) 70 - 99 mg/dL  GLUCOSE, CAPILLARY     Status: Abnormal   Collection Time    10/22/13  4:11 AM      Result Value Ref Range   Glucose-Capillary 168 (*) 70 - 99 mg/dL  COMPREHENSIVE METABOLIC PANEL     Status: Abnormal   Collection Time    10/22/13  6:05 AM      Result Value Ref Range   Sodium 139  137 - 147 mEq/L   Potassium 4.0  3.7 - 5.3 mEq/L   Chloride 95 (*) 96 - 112 mEq/L   CO2 28  19 - 32 mEq/L   Glucose, Bld 169 (*) 70 - 99 mg/dL   BUN 17  6 - 23 mg/dL   Creatinine, Ser 3.55 (*) 0.50 - 1.35 mg/dL   Comment: DELTA CHECK NOTED     DIALYSIS   Calcium 9.8  8.4 - 10.5 mg/dL   Total Protein 8.8 (*) 6.0 - 8.3 g/dL   Albumin 2.3 (*) 3.5 - 5.2 g/dL   AST 33  0 - 37 U/L   Comment: HEMOLYSIS AT THIS LEVEL MAY AFFECT RESULT   ALT 16  0 - 53 U/L   Alkaline Phosphatase 61  39 - 117 U/L   Total Bilirubin 0.3  0.3 - 1.2 mg/dL   GFR calc non Af Amer 17 (*) >90 mL/min   GFR calc Af Amer 20 (*) >90 mL/min   Comment: (NOTE)     The eGFR has been calculated using the CKD EPI equation.     This calculation has not been validated in all clinical situations.     eGFR's persistently <90 mL/min signify possible Chronic Kidney     Disease.  CBC WITH DIFFERENTIAL     Status: Abnormal   Collection Time    10/22/13  6:05 AM       Result Value Ref Range   WBC 9.8  4.0 - 10.5 K/uL   RBC 3.68 (*) 4.22 - 5.81 MIL/uL   Hemoglobin 10.5 (*) 13.0 - 17.0 g/dL   HCT 32.8 (*) 39.0 - 52.0 %   MCV 89.1  78.0 - 100.0 fL   MCH 28.5  26.0 - 34.0 pg   MCHC 32.0  30.0 - 36.0 g/dL   RDW 21.2 (*) 11.5 - 15.5 %   Platelets 559 (*) 150 - 400 K/uL   Neutrophils Relative % 75  43 - 77 %   Neutro Abs 7.3  1.7 - 7.7 K/uL   Lymphocytes Relative 16  12 - 46 %   Lymphs Abs 1.6  0.7 - 4.0 K/uL   Monocytes Relative 7  3 - 12 %   Monocytes Absolute 0.7  0.1 - 1.0 K/uL   Eosinophils Relative 2  0 - 5 %   Eosinophils Absolute 0.2  0.0 - 0.7 K/uL   Basophils Relative 0  0 - 1 %   Basophils Absolute 0.0  0.0 - 0.1 K/uL  MAGNESIUM     Status: None   Collection Time    10/22/13  6:05 AM      Result Value Ref Range   Magnesium 2.3  1.5 - 2.5 mg/dL  GLUCOSE, CAPILLARY     Status: Abnormal   Collection Time    10/22/13  7:42  AM      Result Value Ref Range   Glucose-Capillary 167 (*) 70 - 99 mg/dL   Comment 1 Notify RN     Comment 2 Documented in Chart    GLUCOSE, CAPILLARY     Status: Abnormal   Collection Time    10/22/13 11:49 AM      Result Value Ref Range   Glucose-Capillary 165 (*) 70 - 99 mg/dL   Comment 1 Notify RN     Comment 2 Documented in Chart    CULTURE, BLOOD (ROUTINE X 2)     Status: None   Collection Time    10/22/13  1:10 PM      Result Value Ref Range   Specimen Description BLOOD RIGHT HAND     Special Requests BOTTLES DRAWN AEROBIC ONLY 3CC     Culture  Setup Time       Value: 10/22/2013 21:20     Performed at [facility]   Culture       Value:        BLOOD CULTURE RECEIVED NO GROWTH TO DATE CULTURE WILL BE HELD FOR 5 DAYS BEFORE ISSUING A FINAL NEGATIVE REPORT     Performed at [facility]   Report Status PENDING    GLUCOSE, CAPILLARY     Status: Abnormal   Collection Time    10/22/13  4:42 PM      Result Value Ref Range   Glucose-Capillary 212 (*) 70 - 99 mg/dL   Comment 1 Notify RN      Comment 2 Documented in Chart    GLUCOSE, CAPILLARY     Status: Abnormal   Collection Time    10/22/13  7:33 PM      Result Value Ref Range   Glucose-Capillary 167 (*) 70 - 99 mg/dL  GLUCOSE, CAPILLARY     Status: Abnormal   Collection Time    10/22/13 11:53 PM      Result Value Ref Range   Glucose-Capillary 161 (*) 70 - 99 mg/dL  GLUCOSE, CAPILLARY     Status: Abnormal   Collection Time    10/23/13  4:00 AM      Result Value Ref Range   Glucose-Capillary 174 (*) 70 - 99 mg/dL  COMPREHENSIVE METABOLIC PANEL     Status: Abnormal   Collection Time    10/23/13  6:34 AM      Result Value Ref Range   Sodium 144  137 - 147 mEq/L   Potassium 3.8  3.7 - 5.3 mEq/L   Chloride 100  96 - 112 mEq/L   CO2 27  19 - 32 mEq/L   Glucose, Bld 174 (*) 70 - 99 mg/dL   BUN 35 (*) 6 - 23 mg/dL   Creatinine, Ser 5.72 (*) 0.50 - 1.35 mg/dL   Comment: DELTA CHECK NOTED   Calcium 10.0  8.4 - 10.5 mg/dL   Total Protein 8.7 (*) 6.0 - 8.3 g/dL   Albumin 2.3 (*) 3.5 - 5.2 g/dL   AST 36  0 - 37 U/L   Comment: HEMOLYSIS AT THIS LEVEL MAY AFFECT RESULT   ALT 16  0 - 53 U/L   Alkaline Phosphatase 63  39 - 117 U/L   Total Bilirubin 0.3  0.3 - 1.2 mg/dL   GFR calc non Af Amer 10 (*) >90 mL/min   GFR calc Af Amer 11 (*) >90 mL/min   Comment: (NOTE)     The eGFR has been calculated using  the CKD EPI equation.     This calculation has not been validated in all clinical situations.     eGFR's persistently <90 mL/min signify possible Chronic Kidney     Disease.  CBC WITH DIFFERENTIAL     Status: Abnormal   Collection Time    10/23/13  6:34 AM      Result Value Ref Range   WBC 10.5  4.0 - 10.5 K/uL   RBC 3.61 (*) 4.22 - 5.81 MIL/uL   Hemoglobin 10.1 (*) 13.0 - 17.0 g/dL   HCT 32.6 (*) 39.0 - 52.0 %   MCV 90.3  78.0 - 100.0 fL   MCH 28.0  26.0 - 34.0 pg   MCHC 31.0  30.0 - 36.0 g/dL   RDW 21.4 (*) 11.5 - 15.5 %   Platelets 568 (*) 150 - 400 K/uL   Neutrophils Relative % 67  43 - 77 %   Lymphocytes  Relative 19  12 - 46 %   Monocytes Relative 10  3 - 12 %   Eosinophils Relative 3  0 - 5 %   Basophils Relative 1  0 - 1 %   Neutro Abs 7.0  1.7 - 7.7 K/uL   Lymphs Abs 2.0  0.7 - 4.0 K/uL   Monocytes Absolute 1.1 (*) 0.1 - 1.0 K/uL   Eosinophils Absolute 0.3  0.0 - 0.7 K/uL   Basophils Absolute 0.1  0.0 - 0.1 K/uL   RBC Morphology POLYCHROMASIA PRESENT     Comment: TARGET CELLS   WBC Morphology ATYPICAL LYMPHOCYTES    MAGNESIUM     Status: None   Collection Time    10/23/13  6:34 AM      Result Value Ref Range   Magnesium 2.4  1.5 - 2.5 mg/dL  GLUCOSE, CAPILLARY     Status: Abnormal   Collection Time    10/23/13  7:39 AM      Result Value Ref Range   Glucose-Capillary 159 (*) 70 - 99 mg/dL  GLUCOSE, CAPILLARY     Status: Abnormal   Collection Time    10/23/13 11:46 AM      Result Value Ref Range   Glucose-Capillary 191 (*) 70 - 99 mg/dL   Dg Chest Port 1 View  10/22/2013   CLINICAL DATA:  Fever.  EXAM: PORTABLE CHEST - 1 VIEW  COMPARISON:  09/2013  FINDINGS: A nasogastric tube has been placed since the prior study. It extends into the proximal stomach.  Lungs are clear.  No pleural effusion or pneumothorax.  Heart, mediastinum hila are unremarkable.  IMPRESSION: No acute cardiopulmonary disease.   Electronically Signed   By: Lajean Manes M.D.   On: 10/22/2013 14:30    Review of Systems  Constitutional: Positive for fever and weight loss.  Respiratory: Negative for shortness of breath.   Cardiovascular: Negative for chest pain.  Gastrointestinal: Negative for nausea and vomiting.  Neurological: Positive for weakness. Negative for dizziness.    Blood pressure 199/67, pulse 91, temperature 97.7 F (36.5 C), temperature source Oral, resp. rate 20, height _0  (1.422 m), weight 76 kg (167 lb 8.8 oz), SpO2 100.00%. Physical Exam  Constitutional: He appears well-nourished.  Cardiovascular: Normal rate and regular rhythm.   No murmur heard. Respiratory: Effort normal and  breath sounds normal. He has no wheezes.  GI: Soft. Bowel sounds are normal. There is no tenderness.  Musculoskeletal: Normal range of motion.  B BKA  Neurological:  Does say name; otherwise non communicative  Skin: Skin is warm.  Psychiatric:  Consented wife over phone     Assessment/Plan Abscess Rt BKA Fever; AMS Encephalopathy FTT; dehydration Existing NG- will need long term care Scheduled for perc G tube in IR 3/31 pts wife aware of procedure benefits and risks and agreeable to proceed Consent signed and in chart- via phone Check kub; ancef ordered Check coags    [place] A 10/23/2013, 2:46 PM

## 2013-10-24 ENCOUNTER — Inpatient Hospital Stay (HOSPITAL_COMMUNITY): Payer: Medicare Other

## 2013-10-24 DIAGNOSIS — N186 End stage renal disease: Secondary | ICD-10-CM | POA: Diagnosis not present

## 2013-10-24 LAB — CBC
HCT: 30.1 % — ABNORMAL LOW (ref 39.0–52.0)
Hemoglobin: 9.5 g/dL — ABNORMAL LOW (ref 13.0–17.0)
MCH: 28.1 pg (ref 26.0–34.0)
MCHC: 31.6 g/dL (ref 30.0–36.0)
MCV: 89.1 fL (ref 78.0–100.0)
Platelets: 596 10*3/uL — ABNORMAL HIGH (ref 150–400)
RBC: 3.38 MIL/uL — AB (ref 4.22–5.81)
RDW: 20.8 % — ABNORMAL HIGH (ref 11.5–15.5)
WBC: 9 10*3/uL (ref 4.0–10.5)

## 2013-10-24 LAB — RENAL FUNCTION PANEL
Albumin: 2.4 g/dL — ABNORMAL LOW (ref 3.5–5.2)
BUN: 55 mg/dL — ABNORMAL HIGH (ref 6–23)
CALCIUM: 10 mg/dL (ref 8.4–10.5)
CHLORIDE: 98 meq/L (ref 96–112)
CO2: 26 meq/L (ref 19–32)
CREATININE: 7.3 mg/dL — AB (ref 0.50–1.35)
GFR calc Af Amer: 8 mL/min — ABNORMAL LOW (ref >90)
GFR calc non Af Amer: 7 mL/min — ABNORMAL LOW (ref >90)
GLUCOSE: 165 mg/dL — AB (ref 70–99)
Phosphorus: 4.8 mg/dL — ABNORMAL HIGH (ref 2.3–4.6)
Potassium: 3.4 mEq/L — ABNORMAL LOW (ref 3.7–5.3)
Sodium: 142 mEq/L (ref 137–147)

## 2013-10-24 LAB — GLUCOSE, CAPILLARY
Glucose-Capillary: 139 mg/dL — ABNORMAL HIGH (ref 70–99)
Glucose-Capillary: 147 mg/dL — ABNORMAL HIGH (ref 70–99)
Glucose-Capillary: 115 mg/dL — ABNORMAL HIGH (ref 70–99)
Glucose-Capillary: 153 mg/dL — ABNORMAL HIGH (ref 70–99)
Glucose-Capillary: 180 mg/dL — ABNORMAL HIGH (ref 70–99)

## 2013-10-24 MED ORDER — CEFAZOLIN SODIUM-DEXTROSE 2-3 GM-% IV SOLR
2.0000 g | Freq: Once | INTRAVENOUS | Status: DC
  Filled 2013-10-24: qty 50

## 2013-10-24 MED ORDER — DARBEPOETIN ALFA-POLYSORBATE 25 MCG/0.42ML IJ SOLN
INTRAMUSCULAR | Status: AC
  Filled 2013-10-24: qty 0.42

## 2013-10-24 MED ORDER — HEPARIN SODIUM (PORCINE) 1000 UNIT/ML DIALYSIS: 40.0000 [IU]/kg | INTRAMUSCULAR | Status: DC | PRN

## 2013-10-24 NOTE — Progress Notes (Signed)
Physical Therapy Treatment Patient Details Name: Oscar Castillo MRN: [id number] DOB: May 29, 1951 Today's Date: 10/24/2013    History of Present Illness 63 y.o. male admitted to [facility] on 10/01/13 with AMS and FTT.  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 Of note, he has history of L BKA as well. Pt with significant PMHx of diabetes and severe peripheral vascular disease who also has end-stage renal disease and undergoes dialysis.  S/p revision right transtibial amputation 3/18    PT Comments    Patient progressing slowly due to pain limiting treatment and tolerance to movement/upright activity today.  More participative in rolling activities due to uncomfortable in supine; positioned on side with pillows for comfort.  Follow Up Recommendations  SNF;Supervision/Assistance - 24 hour     Equipment Recommendations  None recommended by PT    Recommendations for Other Services       Precautions / Restrictions Precautions Precautions: Fall Precaution Comments: pt also with poor vision, wife reporting he has to have cataract surgery    Mobility  Bed Mobility   Bed Mobility: Rolling;Sidelying to Sit;Sit to Supine Rolling: Min guard Sidelying to sit: Max assist;Mod assist;+2 for physical assistance   Sit to supine: Max assist;+2 for physical assistance   General bed mobility comments: initiates rolling due to uncomfortable in bed  Transfers                    Ambulation/Gait                 Stairs            Wheelchair Mobility    Modified Rankin (Stroke Patients Only)       Balance   Sitting-balance support: Single extremity supported;Bilateral upper extremity supported;Feet unsupported Sitting balance-Leahy Scale: Poor Sitting balance - Comments: attempts to have pt catch his balance unsuccessful; ptient continued to fall/push to right and needed mod to max assist of 2 for safety; sat edge of bed approx 3 minutes, then 10  minutes                            Cognition Arousal/Alertness: Awake/alert Behavior During Therapy: Restless Overall Cognitive Status: Impaired/Different from baseline     Current Attention Level: Focused Memory: Decreased short-term memory Following Commands: Follows one step commands inconsistently Safety/Judgement: Decreased awareness of safety;Decreased awareness of deficits   Problem Solving: Slow processing;Decreased initiation;Requires verbal cues;Requires tactile cues      Exercises Other Exercises Other Exercises: PROM stretch into knee extension bilaterally 5x 20 seconds each    General Comments        Pertinent Vitals/Pain Moaning and rolling in bed, RN aware (pt unable to rate pain, but admits he is in pain)    Home Living                      Prior Function            PT Goals (current goals can now be found in the care plan section) Progress towards PT goals: Progressing toward goals    Frequency  Min 2X/week    PT Plan Current plan remains appropriate    End of Session   Activity Tolerance: Patient limited by fatigue;Patient limited by pain Patient left: in bed;with call bell/phone within reach     Time: 1342-1410 PT Time Calculation (min): 28 min  Charges:  $Therapeutic Activity: 23-37 mins  G Codes:      Gevork Ayyad,CYNDI 10/24/2013, 2:24 PM Magda Kiel, [place] 10/24/2013

## 2013-10-24 NOTE — Progress Notes (Signed)
[facility] Progress Note  Assessment/Plan: 1.  AMS - Sluggish improvement. The etiology appears multifactorial (depression/ hx cva/metabolic encephalopathy) Imaging of brain - no acute findings - see #9 2. Bleeding s/p R BKA revision 3/18 - MRI Rt tib/fib on 3/26 negative for abscess, osteo or cellulitis, but small hematoma and effusion present. Mgmt per ortho  3. ESRD - TTS - Labs pending the only fluid input is through TFs and medications K 3.8 Monday - will change to 4 K pending K results 4. Anemia - Hgb 10.1 Continue Aranesp 25 Q Tuesday. Last Tsat 40 on 2/28. No Fe for now.  5. HTN/ volume - SBPs higher the past 24 hours - on labetalol BID. Above edw. No overt excess on CXR 6. MBD - Hypercalcemia worsening. Corr Ca 11.2, most likely from TF. (Ca based is only option) Continue holding Vit D. Low phos, hold binder. Start with  2 Ca bath 2 day and continue as K allows 7. Malnutrition - NG placed sec to poor po intake, TF - nepro 45/hr, prostat - NPO at present for PEG tube 8. Hep C + LFT ok  9. FTT/debiliatated/from SNF - Palliative care met with pt's wife on 3/22, full code per family; I believe this needs to be revisited; his QOL is very poor.  Unless is mental status becomes clearer, I do not think he is appropriate for outpt dialysis due to safety issues. 10. Depression - seen by psych - liquid prozac started 3/23 - no significant improvement seen  Myriam Jacobson, PA-C [facility] [phone] 10/24/2013,8:01 AM  LOS: 23 days  Subjective:     Objective Filed Vitals:   10/23/13 1151 10/23/13 1620 10/23/13 2025 10/24/13 0448  BP: 199/67 145/76 162/85 142/74  Pulse: 91 85 91 79  Temp: 97.7 F (36.5 C) 98.6 F (37 C) 98.6 F (37 C) 98.4 F (36.9 C)  TempSrc: Oral Oral Oral Oral  Resp: _0 Height:      Weight:  76 kg (167 lb 8.8 oz) 78 kg (171 lb 15.3 oz)   SpO2: 100% 100% 100% 100%   Physical Exam General: NAD HD being initiated -  restless in bed Heart: RRR Lungs: no wheezes or rales Abdomen: soft  Extremities: bilateral bka right wrapped; left with scabbed areas - no edmea Dialysis Access: left upper AVF patent  [facility]  4h 103mn 72.5kg 2/2.0 Bath LUA AVF Heparin 5000 / 2400  Hectorol 1 Epo none Venofer none   Additional Objective Labs: Basic Metabolic Panel:  Recent Labs Lab 10/19/13 1027  10/21/13 0500 10/22/13 0605 10/23/13 0634  NA  --   < > 140 139 144  K  --   < > 3.4* 4.0 3.8  CL  --   < > 92* 95* 100  CO2  --   < > _1 GLUCOSE  --   < > 169* 169* 174*  BUN  --   < > 39* 17 35*  CREATININE  --   < > 6.37* 3.55* 5.72*  CALCIUM  --   < > 9.8 9.8 10.0  PHOS <0.5*  --   --   --   --   < > = values in this interval not displayed. Liver Function Tests:  Recent Labs Lab 10/21/13 0500 10/22/13 0605 10/23/13 0634  AST 24 33 36  ALT _2 ALKPHOS 62 61 63  BILITOT 0.4 0.3 0.3  PROT  8.6* 8.8* 8.7*  ALBUMIN 2.4* 2.3* 2.3*   CBC:  Recent Labs Lab 10/19/13 0546 10/20/13 0636 10/21/13 0500 10/22/13 0605 10/23/13 0634  WBC 9.6 10.0 8.3 9.8 10.5  NEUTROABS 6.8 6.9 6.0 7.3 7.0  HGB 9.7* 10.6* 9.9* 10.5* 10.1*  HCT 29.9* 33.0* 30.4* 32.8* 32.6*  MCV 86.2 87.5 86.6 89.1 90.3  PLT 409* 501* 534* 559* 568*   Blood Culture    Component Value Date/Time   SDES BLOOD RIGHT HAND 10/22/2013 2155   SPECREQUEST BOTTLES DRAWN AEROBIC ONLY [facility] 10/22/2013 2155   CULT  Value:        BLOOD CULTURE RECEIVED NO GROWTH TO DATE CULTURE WILL BE HELD FOR 5 DAYS BEFORE ISSUING A FINAL NEGATIVE REPORT Performed at [facility] 10/22/2013 2155   REPTSTATUS PENDING 10/22/2013 2155   CBG:  Recent Labs Lab 10/23/13 1146 10/23/13 1618 10/23/13 2003 10/24/13 0022 10/24/13 0420  GLUCAP 191* 191* 173* 139* 147*  Studies/Results: Dg Chest Port 1 View  10/22/2013   CLINICAL DATA:  Fever.  EXAM: PORTABLE CHEST - 1 VIEW  COMPARISON:  09/2013  FINDINGS: A nasogastric tube has been  placed since the prior study. It extends into the proximal stomach.  Lungs are clear.  No pleural effusion or pneumothorax.  Heart, mediastinum hila are unremarkable.  IMPRESSION: No acute cardiopulmonary disease.   Electronically Signed   By: Lajean Manes M.D.   On: 10/22/2013 14:30   Dg Abd Portable 1v  10/24/2013   CLINICAL DATA:  Evaluate barium prior to potential percutaneous gastrostomy tube placement  EXAM: PORTABLE ABDOMEN - 1 VIEW  COMPARISON:  DG ABD PORTABLE 1V dated 10/23/2013; CT ABD/PELVIS W CM dated 10/15/2013  FINDINGS: Administered enteric contrast is seen throughout the colon. There is a trace amount of residual barium within the gastric fundus. Enteric tube tip and side port overlie the expected location of the gastric fundus.  Nonobstructive bowel gas pattern. No supine evidence of pneumoperitoneum. No definite pneumatosis or portal venous gas.  Limited visualization of lower thorax is normal.  No acute osseus abnormalities.  IMPRESSION: Enteric contrast throughout the colon.   Electronically Signed   By: Sandi Mariscal M.D.   On: 10/24/2013 07:51   Dg Abd Portable 1v  10/23/2013   CLINICAL DATA:  Nasogastric tube location  EXAM: PORTABLE ABDOMEN - 1 VIEW  COMPARISON:  Prior abdominal radiograph 10/15/2013  FINDINGS: The tip of the nasogastric tube is in the gastric fundus. There is a small amount of contrast material within the gastric fundus and proximal small bowel. The visualized lungs are clear. The bowel gas pattern is not obstructed.  IMPRESSION: The tip of the nasogastric tube projects over the gastric fundus.   Electronically Signed   By: Jacqulynn Cadet M.D.   On: 10/23/2013 21:29   Medications: . dextrose 5 % and 0.9% NaCl 10 mL/hr at 10/21/13 1940  . feeding supplement (NEPRO CARB STEADY) 1,000 mL (10/22/13 1945)   . bisacodyl  5 mg Oral BID  .  ceFAZolin (ANCEF) IV  2 g Intravenous Once  . darbepoetin (ARANESP) injection - DIALYSIS  25 mcg Intravenous Q Tue-HD  . feeding  supplement (PRO-STAT SUGAR FREE 64)  30 mL Per Tube Q1200  . FLUoxetine  20 mg Per Tube Daily  . insulin aspart  0-20 Units Subcutaneous 6 times per day  . labetalol  200 mg Oral BID  . multivitamin  1 tablet Oral QHS  . neomycin-bacitracin-polymyxin   Topical TID  .  ondansetron (ZOFRAN) IV  4 mg Intravenous 3 times per day  . sodium chloride  3 mL Intravenous Q12H

## 2013-10-24 NOTE — Progress Notes (Signed)
I have personally seen and examined this patient and agree with the assessment/plan as outlined above by [facility] PA. Plan noted for PEG tube today by IR- overall prognosis poor and agree that he will be unsafe for OP dialysis  Yulia Ulrich K.,MD 10/24/2013 10:04 AM

## 2013-10-24 NOTE — Procedures (Signed)
Patient seen on Hemodialysis. QB 400, UF goal 1500 Treatment adjusted as needed.  Elmarie Shiley MD [facility]. Office # [phone] Pager # [phone] 10:06 AM

## 2013-10-24 NOTE — Progress Notes (Addendum)
[facility] PROGRESS NOTE  AC GLADE [id number] DOB: [birth year] DOA: 10/01/2013 PCP: [name], MD  Assessment/Plan:  Encephalopathy, metabolic  -Suspect dehydration and possible underlying infectious processes as etiology; 3/13 patient's cognition appears to be clearing. Patient eating and answering simple questions  -3/17 [name], very confused however patient S/P dialysis today   -3/20 A/O x0, very confused; did receive Ativan last night and attempt to obtain MRI -3/20 A/O. Just returned from MRI -3/30 patient's continued encephalopathy not secondary to new CVA as previously discussed with family most likely multifactorial to include dialysis, bilateral BKA, diabetes, previous CVA, depression. -3/31 patient continues to have encephalopathy multifactorial; family was on realistic expectations as to outcome.  New LBBB  -has been transient in nature  -systolic function normal but septal motion with abnormal function and dyssynergy  -cardiac enzymes negative  -doubt pt appropriate for invasive cardiac work up   Dehydration /FTT (failure to thrive) in adult  -Continue IV fluid-type of fluid adjusted by nephrology today  - Speech and Language Pathologist (SLP) attempted 10/02/2013 the patient unable to arouse enough to eval but later re examined and now on D1 diet  -Patient has begun to eat his meals., However very cachectic continue ensure 3 times a day between meals.  -IV to [facility] except when giving meds -3/25 patient pulled his NG tube; will leave out an attempt to have patient more comfortable/less agitated in order to obtain MRI of head/right BKA stump  -3/26 replace NG tube resumed feedings  - 3/30 continued NG tube feedings have discussed with wife the need for placement since patient cognition has not improved to the point where he can have adequate by mouth intake on daily basis and she has agreed. -3/30 DC'd heparin will need to restart after placement of PEG  tube in the a.m. -3/31. Tube placement delayed until 4/1  Fever  -Unclear etiology- suspect DH and atelectasis  -question of right lower lobe right but currently is not hypoxic  -He did have leukocytosis so continue empiric antibiotics as precaution  -Patient unable to contribute to history and given altered mentation and fever an LP was done to rule out possible meningitis-CSF studies pending-partial results with 35 of protein and 156 of glucose so not indicative of bacterial meningitis-CSF culture no growth  -Influenza PCR negative  -Medication reconciliation list that patient was on doxycycline prior to admission which was started on 09/29/2013 or suspected postoperative wound infection/possible osteomyelitis and scheduled to go for 3 weeks  -Blood cultures no growth to date urinalysis unremarkable  -3/14 Right BKA stump shows abscess/osteomyelitis by MRI;; see results below  -3/17 patient remains afebrile;  -3/18 surgeon's S/P abscess drainage/revision of right BKA stump on Wednesday 3/18 -3/30 afebrile overnight   -3/31 afebrile overnight    HYPERTENSION  -Although blood pressure not within AHA guidelines will allow permissive HTN given patient's waxing and waning cognitive state, poor perfusion secondary to severe PVD   -Continue hydralazine 5 mg PRN SBP> 160 or DBP> 100 -3/30 increase labetalol 200 mg BID  -3/31 continue labetalol 200 mg BID  Anemia due to chronic illness  -Hemoglobin has trended down to 13 after hydration noting baseline just under 11  -3/17 Monitor closely currently at 9.5, repeat H./H.  -3/17 type and cross. With patient having revision in a.m. of right BKA will most likely need post surgical transfusion (would use hemoconcentrated PRBC) -3/26 stable -3/30 stable -3/31 stable  DM (diabetes mellitus) type I controlled with renal manifestation  -  On Lantus prior to admission -Patient has begun to eat at least partial to all portions of his meals. Being  supplemented by Ensure between meals.    -Continue resistant SSI; CBG currently controlled on this regimen   CKD (chronic kidney disease) stage V requiring chronic dialysis  -Usual dialysis days Tuesday Thursday Saturday  -No ultrafiltration secondary to ongoing dehydration   Chronic combined systolic (EF 16%) and grade 2 diastolic congestive heart failure  -Compensated and managed with dialysis on T/Th/Sat  HEPATITIS C, HX OF  -Liver enzymes have been stable since patient's admission  CEREBROVASCULAR ACCIDENT, HX OF -No evidence of acute infarct  -3/26 MRI of brain noncontrast negative acute finding see report below  Hyperlipidemia -lipid panel; w/i NCEP guidelines  Right BKA -Pain is increasing since surgery in February 2015. MRI 3/13 shows pathology see results below -3/14 MRI of right BKA stump shows abscess/osteomyelitis see results below -3/15 stop much less tender today, negative discharge, fibrotic tissue along the skin closure. -3/16 right BKA stump more tender today on exam. -3/17 patient's right BKA stump nontender to palpation there   -3/25 right BKA stump revision appropriately tender -3/30 MRI right BKA stump negative for osteomyelitis see results below -3/30 negative  osteomyelitis by MRI on 3/26; see results below. Patient's incision site bleeding, seems to be generalized around the sutures/staples will apply dressing. -3/31 bleeding resolved  Osteomyelitis right BKA stump -Continue  Zosyn + vancomycin -3/15 seen by Dr. Jean Rosenthal (orthopedic surgery), round discuss abnormal findings of MRI right BKA stump and decide on course of action on 3/16 (debridement, stump revision?) -Continue morphine 1-2 mg q. 4 hours PRN severe pain in right BKA stump. -Dr. Meridee Score (orthopedic surgeon) revised right BKA stump secondary abscess/osteomyelitis on 3/18   Nutrition -Nephro at 45 ml/hr       Code Status: Full Family Communication:  Spoke with wife and  discussed placement of PEG tube; wife agreed to procedure   Disposition Plan: Per notes, and wife continues to be full code, will need SNF following PEG placement     Consultants: Dr. Roney Jaffe Nephrology  Dr Meridee Score (Orthopedic Surgery) Dr. Jean Rosenthal (orthopedic surgery) Dr. Rhea Pink (palliative care) Radiology for lumbar puncture   Procedures: 3/26/to 15 MRI right tibia/fibula without contrast 1. No evidence of osteomyelitis or soft tissue abscess or  cellulitis.  2. Small hematoma in the posterior aspect of the stump.  3. Small right knee effusion, unchanged since the prior exam and  only minimally more prominent than in the opposite knee.   10/19/2013 MR head without contrast Progression of atrophy and small vessel disease. No acute  intracranial findings.  No interval change in the appearance of multiple small foci of  chronic hemorrhage.  No proximal significant flow reducing lesion is evident.   10/11/2013 revision of right BKA secondary to Osteomyelitis/Abscess  MRI tib-fib right BKA stump 10/06/2013 Findings consistent with a focal abscess at the tip of the stumps of  the distal tibia and fibula.  Subtle edema at the tip of the stumps of the tibia and fibula most  likely represent focal osteomyelitis because the fluid collections,  which probably represent pus, are immediately adjacent to the bones.  but there is no discrete bone destruction.  Probable adjacent myositis/cellulitis.   CT head without contrast 10/01/2013 1. There is no evidence of an acute intracranial hemorrhage nor of  an evolving ischemic infarction.  2. There is stable diffuse atrophy with evidence of chronic small  vessel ischemic change.  3. There is no intracranial mass effect.  4. There is no evidence of an acute skull fracture.   Lumbar puncture by interventional radiology   Antibiotics: Zosyn 3/14 >> stopped 3/28 Vancomycin 3/14>> stopped 3/29 Aztreonam  3/08 >>> stopped 3/14 Vancomycin 3/08 >>> stopped 3/14      HPI/Subjective: 63 year old male PMHx HTN, HLD, Hx diabetes type 1 controlled, vasculopath, S/P recent right BKA on 08/2013. He also has a prior history of CVA in 2009 and is on chronic hemodialysis. In addition he has combined systolic& diastolic heart failure with an EF of 45%. According to the patient's wife he had been doing well in the postoperative period until around the same time he followed up with Dr. Sharol Given. At that time he started with poor intake and gradually increasing altered mentation. 09/24/2013 he was very confused when not work with the therapist. On 09/26/2013 a meeting was called at the nursing facility because he was experiencing extreme symptoms of failure to thrive and they were concerned the patient was depressed. Palliative care team had apparently been meeting with the family prior to admission. By 09/29/2013 he was a little more coherent and began trying to eat but later in the morning he would not take his meds kept his mouth shut.  Postoperatively he has had 2 courses of antibiotics for concerns of possible stump infection. In regards to dietary intake: in addition to the above the patient has been on a dysphagia diet with pured foods begun after the patient was noted to be coughing with attempts to eat.  Upon presentation to the emergency department EMS reported that the wife found food in the patient's mouth and he was unresponsive. His CBG was 379. He was hypoxemic and placed on a nonrebreather mask. His hemoglobin at presentation was 16.7 which was markedly elevated compared to prior readings between 9.9 and 11.5. He was also noted to have significant decreased breath sounds on the right without wheezing or crackles. The right BKA stump was unremarkable in appearance. Was also found to be mildly febrile with a rectal temperature of 101. Chest x-ray was concerning for possible right lower lobe pneumonia. 3/12 patient  just returned from HD somewhat lethargic, complained of right BKA stump pain which has been increasing over several days 3/13 patient interacting with medical staff appropriately, able to participate in dressing change of right BKA 3/14 patient alert speaking in full sentences, sitting in bed eating lunch. Writhing in pain and indicating pain is in his right BKA stump. 3/15 patient states the pain in his right BKA stump significantly decreased on the pain regimen he is on now. Negative N./V. states has been eating his meals today. States understands he has an infection in the right BKA stump and further surgery may be required. 3/16 patient able to answer some questions, and states understands the plan for right BKA stump revision.  Pain slightly increased over yesterday. 3/17 patient S/P HD, confused which is his baseline post HD 3/25 patient is lethargic however will follow some commands did cooperate with dressing change.3/26 patient arousable with extreme stimuli, stated negative SOB/CP NOTE; patient just returning from MRI, received some sedation 3/30 patient sleepy but arousable A./O. x1 (does not know when, where, why), states negative pain. 3/31 [name] does follow commands   Objective: Filed Vitals:   10/24/13 1145 10/24/13 1200 10/24/13 1220 10/24/13 1400  BP: 103/61 113/56 121/63 144/79  Pulse: 90 92 94 93  Temp:   98.5  F (36.9 C) 98 F (36.7 C)  TempSrc:   Oral Oral  Resp:   18 18  Height:      Weight:   77 kg (169 lb 12.1 oz)   SpO2:   100% 100%    Intake/Output Summary (Last 24 hours) at 10/24/13 1735 Last data filed at 10/24/13 1500  Gross per 24 hour  Intake    600 ml  Output    969 ml  Net   -369 ml   Filed Weights   10/23/13 2025 10/24/13 0805 10/24/13 1220  Weight: 78 kg (171 lb 15.3 oz) 78 kg (171 lb 15.3 oz) 77 kg (169 lb 12.1 oz)    Exam:   General:  A./O. x0 , does follow commands     Cardiovascular: Regular rhythm and rate, negative murmurs rubs  gallop  Respiratory: Clear to auscultation bilateral  Abdomen: Soft, nontender, nondistended plus bowel sound  Musculoskeletal: Bilateral BKA, left BKA performed November 2014 negative tenderness to palpation; right BKA performed February 2015/with revision 10/11/2013, bleeding around incision site resolved, nontender to palpation, Staples and sutures intact.      Data Reviewed: Basic Metabolic Panel:  Recent Labs Lab 10/19/13 0546 10/19/13 1027 10/20/13 0636 10/21/13 0500 10/22/13 0605 10/23/13 0634 10/24/13 0826  NA 139  --  139 140 139 144 142  K 3.7  --  3.9 3.4* 4.0 3.8 3.4*  CL 92*  --  93* 92* 95* 100 98  CO2 25  --  27 28 28 27 26   GLUCOSE 105*  --  104* 169* 169* 174* 165*  BUN 34*  --  22 39* 17 35* 55*  CREATININE 6.19*  --  3.98* 6.37* 3.55* 5.72* 7.30*  CALCIUM 10.0  --  9.6 9.8 9.8 10.0 10.0  MG 2.4  --  2.3 2.3 2.3 2.4  --   PHOS  --  <0.5*  --   --   --   --  4.8*   Liver Function Tests:  Recent Labs Lab 10/19/13 0546 10/20/13 0636 10/21/13 0500 10/22/13 0605 10/23/13 0634 10/24/13 0826  AST 22 35 24 33 36  --   ALT 10 16 14 16 16   --   ALKPHOS 58 66 62 61 63  --   BILITOT 0.5 0.5 0.4 0.3 0.3  --   PROT 8.7* 9.4* 8.6* 8.8* 8.7*  --   ALBUMIN 2.3* 2.5* 2.4* 2.3* 2.3* 2.4*   No results found for this basename: LIPASE, AMYLASE,  in the last 168 hours No results found for this basename: AMMONIA,  in the last 168 hours CBC:  Recent Labs Lab 10/19/13 0546 10/20/13 0636 10/21/13 0500 10/22/13 0605 10/23/13 0634 10/24/13 0826  WBC 9.6 10.0 8.3 9.8 10.5 9.0  NEUTROABS 6.8 6.9 6.0 7.3 7.0  --   HGB 9.7* 10.6* 9.9* 10.5* 10.1* 9.5*  HCT 29.9* 33.0* 30.4* 32.8* 32.6* 30.1*  MCV 86.2 87.5 86.6 89.1 90.3 89.1  PLT 409* 501* 534* 559* 568* 596*   Cardiac Enzymes: No results found for this basename: CKTOTAL, CKMB, CKMBINDEX, TROPONINI,  in the last 168 hours BNP (last 3 results)  Recent Labs  10/01/13 1135  PROBNP 1397.0*   CBG:  Recent  Labs Lab 10/23/13 2003 10/24/13 0022 10/24/13 0420 10/24/13 1311 10/24/13 1631  GLUCAP 173* 139* 147* 115* 153*    Recent Results (from the past 240 hour(s))  CULTURE, BLOOD (ROUTINE X 2)     Status: None   Collection Time  10/22/13  1:10 PM      Result Value Ref Range Status   Specimen Description BLOOD RIGHT HAND   Final   Special Requests BOTTLES DRAWN AEROBIC ONLY 3CC   Final   Culture  Setup Time     Final   Value: 10/22/2013 21:20     Performed at [facility]   Culture     Final   Value:        BLOOD CULTURE RECEIVED NO GROWTH TO DATE CULTURE WILL BE HELD FOR 5 DAYS BEFORE ISSUING A FINAL NEGATIVE REPORT     Performed at [facility]   Report Status PENDING   Incomplete  CULTURE, BLOOD (ROUTINE X 2)     Status: None   Collection Time    10/22/13  9:55 PM      Result Value Ref Range Status   Specimen Description BLOOD RIGHT HAND   Final   Special Requests BOTTLES DRAWN AEROBIC ONLY [place]   Final   Culture  Setup Time     Final   Value: 10/23/2013 01:36     Performed at [facility]   Culture     Final   Value:        BLOOD CULTURE RECEIVED NO GROWTH TO DATE CULTURE WILL BE HELD FOR 5 DAYS BEFORE ISSUING A FINAL NEGATIVE REPORT     Performed at [facility]   Report Status PENDING   Incomplete     Studies: Dg Abd Portable 1v  10/24/2013   CLINICAL DATA:  Evaluate barium prior to potential percutaneous gastrostomy tube placement  EXAM: PORTABLE ABDOMEN - 1 VIEW  COMPARISON:  DG ABD PORTABLE 1V dated 10/23/2013; CT ABD/PELVIS W CM dated 10/15/2013  FINDINGS: Administered enteric contrast is seen throughout the colon. There is a trace amount of residual barium within the gastric fundus. Enteric tube tip and side port overlie the expected location of the gastric fundus.  Nonobstructive bowel gas pattern. No supine evidence of pneumoperitoneum. No definite pneumatosis or portal venous gas.  Limited visualization of lower thorax is normal.  No  acute osseus abnormalities.  IMPRESSION: Enteric contrast throughout the colon.   Electronically Signed   By: Sandi Mariscal M.D.   On: 10/24/2013 07:51   Dg Abd Portable 1v  10/23/2013   CLINICAL DATA:  Nasogastric tube location  EXAM: PORTABLE ABDOMEN - 1 VIEW  COMPARISON:  Prior abdominal radiograph 10/15/2013  FINDINGS: The tip of the nasogastric tube is in the gastric fundus. There is a small amount of contrast material within the gastric fundus and proximal small bowel. The visualized lungs are clear. The bowel gas pattern is not obstructed.  IMPRESSION: The tip of the nasogastric tube projects over the gastric fundus.   Electronically Signed   By: Jacqulynn Cadet M.D.   On: 10/23/2013 21:29    Scheduled Meds: . bisacodyl  5 mg Oral BID  . [START ON 10/25/2013]  ceFAZolin (ANCEF) IV  2 g Intravenous Once  . darbepoetin      . darbepoetin (ARANESP) injection - DIALYSIS  25 mcg Intravenous Q Tue-HD  . feeding supplement (PRO-STAT SUGAR FREE 64)  30 mL Per Tube Q1200  . FLUoxetine  20 mg Per Tube Daily  . insulin aspart  0-20 Units Subcutaneous 6 times per day  . labetalol  200 mg Oral BID  . multivitamin  1 tablet Oral QHS  . neomycin-bacitracin-polymyxin   Topical TID  . ondansetron (ZOFRAN) IV  4 mg  Intravenous 3 times per day  . sodium chloride  3 mL Intravenous Q12H   Continuous Infusions: . dextrose 5 % and 0.9% NaCl 10 mL/hr at 10/21/13 1940  . feeding supplement (NEPRO CARB STEADY) 1,000 mL (10/24/13 1708)    Active Problems:   HYPERTENSION   HEPATITIS C, HX OF   CEREBROVASCULAR ACCIDENT, HX OF   BENIGN PROSTATIC HYPERTROPHY   Hyperlipidemia   Anemia due to chronic illness   DM (diabetes mellitus) type I controlled with renal manifestation   Chronic combined systolic (EF 01%) and grade 2diastolic congestive heart failure   FTT (failure to thrive) in adult   Encephalopathy, metabolic   Dehydration   Fever    Time spent:40 minutes    Bernie Ransford, J  [facility] Pager [phone]. If 7PM-7AM, please contact night-coverage at [email or web address], password [facility] 10/24/2013, 5:35 PM  LOS: 23 days

## 2013-10-24 NOTE — Progress Notes (Signed)
Patient ID: Oscar Castillo, male   DOB: May 26, 1951, 63 y.o.   MRN: [id number]   Perc G tube in IR has been rescheduled to 4/1 Secondary long dialysis and Heparin in dialysis  RN aware See new orders

## 2013-10-25 ENCOUNTER — Inpatient Hospital Stay (HOSPITAL_COMMUNITY): Payer: Medicare Other

## 2013-10-25 ENCOUNTER — Encounter (HOSPITAL_COMMUNITY): Payer: Self-pay | Admitting: Pharmacy Technician

## 2013-10-25 LAB — GLUCOSE, CAPILLARY
GLUCOSE-CAPILLARY: 112 mg/dL — AB (ref 70–99)
Glucose-Capillary: 105 mg/dL — ABNORMAL HIGH (ref 70–99)
Glucose-Capillary: 135 mg/dL — ABNORMAL HIGH (ref 70–99)
Glucose-Capillary: 154 mg/dL — ABNORMAL HIGH (ref 70–99)
Glucose-Capillary: 177 mg/dL — ABNORMAL HIGH (ref 70–99)
Glucose-Capillary: 206 mg/dL — ABNORMAL HIGH (ref 70–99)

## 2013-10-25 LAB — COMPREHENSIVE METABOLIC PANEL
ALK PHOS: 72 U/L (ref 39–117)
ALT: 21 U/L (ref 0–53)
AST: 41 U/L — ABNORMAL HIGH (ref 0–37)
Albumin: 2.4 g/dL — ABNORMAL LOW (ref 3.5–5.2)
BILIRUBIN TOTAL: 0.4 mg/dL (ref 0.3–1.2)
BUN: 34 mg/dL — AB (ref 6–23)
CHLORIDE: 94 meq/L — AB (ref 96–112)
CO2: 25 mEq/L (ref 19–32)
Calcium: 9.6 mg/dL (ref 8.4–10.5)
Creatinine, Ser: 4.35 mg/dL — ABNORMAL HIGH (ref 0.50–1.35)
GFR calc Af Amer: 15 mL/min — ABNORMAL LOW (ref >90)
GFR calc non Af Amer: 13 mL/min — ABNORMAL LOW (ref >90)
Glucose, Bld: 138 mg/dL — ABNORMAL HIGH (ref 70–99)
POTASSIUM: 3.9 meq/L (ref 3.7–5.3)
SODIUM: 137 meq/L (ref 137–147)
TOTAL PROTEIN: 8.4 g/dL — AB (ref 6.0–8.3)

## 2013-10-25 LAB — CBC WITH DIFFERENTIAL/PLATELET
BASOS ABS: 0.1 10*3/uL (ref 0.0–0.1)
Basophils Relative: 1 % (ref 0–1)
EOS ABS: 0.2 10*3/uL (ref 0.0–0.7)
Eosinophils Relative: 2 % (ref 0–5)
HEMATOCRIT: 31.4 % — AB (ref 39.0–52.0)
Hemoglobin: 9.9 g/dL — ABNORMAL LOW (ref 13.0–17.0)
LYMPHS ABS: 2.3 10*3/uL (ref 0.7–4.0)
Lymphocytes Relative: 21 % (ref 12–46)
MCH: 28.3 pg (ref 26.0–34.0)
MCHC: 31.5 g/dL (ref 30.0–36.0)
MCV: 89.7 fL (ref 78.0–100.0)
MONOS PCT: 11 % (ref 3–12)
Monocytes Absolute: 1.2 10*3/uL — ABNORMAL HIGH (ref 0.1–1.0)
NEUTROS ABS: 7.1 10*3/uL (ref 1.7–7.7)
Neutrophils Relative %: 65 % (ref 43–77)
PLATELETS: 589 10*3/uL — AB (ref 150–400)
RBC: 3.5 MIL/uL — ABNORMAL LOW (ref 4.22–5.81)
RDW: 21.3 % — AB (ref 11.5–15.5)
WBC: 10.9 10*3/uL — AB (ref 4.0–10.5)

## 2013-10-25 LAB — MAGNESIUM: Magnesium: 2.3 mg/dL (ref 1.5–2.5)

## 2013-10-25 MED ORDER — MIDAZOLAM HCL 2 MG/2ML IJ SOLN
INTRAMUSCULAR | Status: AC
  Filled 2013-10-25: qty 4

## 2013-10-25 MED ORDER — FREE WATER
175.0000 mL | Freq: Four times a day (QID) | Status: DC
  Administered 2013-10-25 – 2013-10-31 (×23): 175 mL

## 2013-10-25 MED ORDER — IOHEXOL 300 MG/ML  SOLN
50.0000 mL | Freq: Once | INTRAMUSCULAR | Status: AC | PRN
  Administered 2013-10-25: 20 mL

## 2013-10-25 MED ORDER — FENTANYL CITRATE 0.05 MG/ML IJ SOLN
INTRAMUSCULAR | Status: AC
  Filled 2013-10-25: qty 2

## 2013-10-25 MED ORDER — GLUCAGON HCL (RDNA) 1 MG IJ SOLR
INTRAMUSCULAR | Status: AC
  Administered 2013-10-25: 1 mg
  Filled 2013-10-25: qty 1

## 2013-10-25 MED ORDER — FENTANYL CITRATE 0.05 MG/ML IJ SOLN
INTRAMUSCULAR | Status: AC | PRN
  Administered 2013-10-25: 25 ug via INTRAVENOUS
  Administered 2013-10-25: 50 ug via INTRAVENOUS

## 2013-10-25 MED ORDER — MIDAZOLAM HCL 2 MG/2ML IJ SOLN
INTRAMUSCULAR | Status: AC | PRN
  Administered 2013-10-25: 1 mg via INTRAVENOUS
  Administered 2013-10-25: 0.5 mg via INTRAVENOUS
  Administered 2013-10-25: 1 mg via INTRAVENOUS

## 2013-10-25 NOTE — Progress Notes (Signed)
[facility] Progress Note  Assessment/Plan: 1. AMS - Sluggish improvement. The etiology appears multifactorial (depression/ hx cva/metabolic encephalopathy) Imaging of brain - no acute findings - see #9 2. Bleeding s/p R BKA revision 3/18 - MRI Rt tib/fib on 3/26 negative for abscess, osteo or cellulitis, but small hematoma and effusion present. Mgmt per ortho  3. ESRD - TTS -K 3.9 - 4 K bath; restless on dialysis yestderday -  4. Anemia - Hgb 9.9 stable  Continue Aranesp 25 Q Tuesday. Last Tsat 40 on 2/28. No Fe for now.  5. HTN/ volume -  on labetalol BID BP lower today.  No overt excess on CXR - UF 967 cc Tuesday  6. MBD - Hypercalcemia worsening. Corr Ca 10.8  most likely from TF. (Ca based is only option) Continue holding Vit D. Low phos, hold binder. Start with 2 Ca bath 2 day and continue as K allows - suspect he will have to change to 4 K 7. Malnutrition - NG placed sec to poor po intake, TF - nepro 45/hr, prostat -  PEG tube placed this am 8. Hep C + LFT ok  9. FTT/debiliatated/from SNF - Palliative care met with pt's wife on 3/22, full code per family; I believe this needs to be revisited; his QOL is very poor. Unless is mental status becomes clearer, I do not think he is appropriate for outpt dialysis due to safety issues- He needs LTAC if he is to continue dialysis 10. Depression - seen by psych - liquid prozac started 3/23 - no significant improvement seen  Myriam Jacobson, PA-C [facility] Beeper [phone] 10/25/2013,8:51 AM  LOS: 24 days   Subjective:   No sig verbal response to my questions  Objective Filed Vitals:   10/24/13 1400 10/24/13 1800 10/24/13 2133 10/25/13 0548  BP: 144/79 134/75 100/65 98/58  Pulse: 93 101 85 88  Temp: 98 F (36.7 C) 98.4 F (36.9 C) 99.6 F (37.6 C) 98.9 F (37.2 C)  TempSrc: Oral Oral Oral Oral  Resp: _0 Height:      Weight:   78 kg (171 lb 15.3 oz)   SpO2: 100% 100% 93% 97%   Physical Exam T  max 99.6 General: NAD calm Heart: RRR Lungs: no wheezes or rales Abdomen:soft  NT; GT in place Extremities: right BKA wrapped; left BKA no edema; several/scabbed/ulcerated areas Dialysis Access: left upper AVF + bruit  Dialysis Orders: [facility]  4:15 83 kg 500/A1.5 2K/2Ca Heparin 5000 U, intermittent dose of 2400 U AVF @ LFA  Hectorol 4 mcg Epogen 0 Venofer 100 mg x 10 (through 2/24)  Additional Objective Labs: Basic Metabolic Panel:  Recent Labs Lab 10/19/13 1027  10/23/13 0634 10/24/13 0826 10/25/13 0517  NA  --   < > 144 142 137  K  --   < > 3.8 3.4* 3.9  CL  --   < > 100 98 94*  CO2  --   < > _1 GLUCOSE  --   < > 174* 165* 138*  BUN  --   < > 35* 55* 34*  CREATININE  --   < > 5.72* 7.30* 4.35*  CALCIUM  --   < > 10.0 10.0 9.6  PHOS <0.5*  --   --  4.8*  --   < > = values in this interval not displayed. Liver Function Tests:  Recent Labs Lab 10/22/13 [phone] 10/23/13 4259 10/24/13 5638 10/25/13 7564  AST 33 36  --  41*  ALT 16 16  --  21  ALKPHOS 61 63  --  72  BILITOT 0.3 0.3  --  0.4  PROT 8.8* 8.7*  --  8.4*  ALBUMIN 2.3* 2.3* 2.4* 2.4*   CBC:  Recent Labs Lab 10/21/13 0500 10/22/13 0605 10/23/13 0634 10/24/13 0826 10/25/13 0517  WBC 8.3 9.8 10.5 9.0 10.9*  NEUTROABS 6.0 7.3 7.0  --  7.1  HGB 9.9* 10.5* 10.1* 9.5* 9.9*  HCT 30.4* 32.8* 32.6* 30.1* 31.4*  MCV 86.6 89.1 90.3 89.1 89.7  PLT 534* 559* 568* 596* 589*   Blood Culture    Component Value Date/Time   SDES BLOOD RIGHT HAND 10/22/2013 2155   SPECREQUEST BOTTLES DRAWN AEROBIC ONLY [facility] 10/22/2013 2155   CULT  Value:        BLOOD CULTURE RECEIVED NO GROWTH TO DATE CULTURE WILL BE HELD FOR 5 DAYS BEFORE ISSUING A FINAL NEGATIVE REPORT Performed at [facility] 10/22/2013 2155   REPTSTATUS PENDING 10/22/2013 2155   CBG:  Recent Labs Lab 10/24/13 1631 10/24/13 1958 10/25/13 0015 10/25/13 0407 10/25/13 0758  GLUCAP 153* 180* 177* 154* 135*  Studies/Results: Dg Abd  Portable 1v  10/24/2013   CLINICAL DATA:  Evaluate barium prior to potential percutaneous gastrostomy tube placement  EXAM: PORTABLE ABDOMEN - 1 VIEW  COMPARISON:  DG ABD PORTABLE 1V dated 10/23/2013; CT ABD/PELVIS W CM dated 10/15/2013  FINDINGS: Administered enteric contrast is seen throughout the colon. There is a trace amount of residual barium within the gastric fundus. Enteric tube tip and side port overlie the expected location of the gastric fundus.  Nonobstructive bowel gas pattern. No supine evidence of pneumoperitoneum. No definite pneumatosis or portal venous gas.  Limited visualization of lower thorax is normal.  No acute osseus abnormalities.  IMPRESSION: Enteric contrast throughout the colon.   Electronically Signed   By: Sandi Mariscal M.D.   On: 10/24/2013 07:51   Dg Abd Portable 1v  10/23/2013   CLINICAL DATA:  Nasogastric tube location  EXAM: PORTABLE ABDOMEN - 1 VIEW  COMPARISON:  Prior abdominal radiograph 10/15/2013  FINDINGS: The tip of the nasogastric tube is in the gastric fundus. There is a small amount of contrast material within the gastric fundus and proximal small bowel. The visualized lungs are clear. The bowel gas pattern is not obstructed.  IMPRESSION: The tip of the nasogastric tube projects over the gastric fundus.   Electronically Signed   By: Jacqulynn Cadet M.D.   On: 10/23/2013 21:29   Medications: . dextrose 5 % and 0.9% NaCl 20 mL/hr at 10/24/13 1807  . feeding supplement (NEPRO CARB STEADY) Stopped (10/24/13 2348)   . bisacodyl  5 mg Oral BID  .  ceFAZolin (ANCEF) IV  2 g Intravenous Once  . darbepoetin (ARANESP) injection - DIALYSIS  25 mcg Intravenous Q Tue-HD  . feeding supplement (PRO-STAT SUGAR FREE 64)  30 mL Per Tube Q1200  . FLUoxetine  20 mg Per Tube Daily  . insulin aspart  0-20 Units Subcutaneous 6 times per day  . labetalol  200 mg Oral BID  . multivitamin  1 tablet Oral QHS  . neomycin-bacitracin-polymyxin   Topical TID  . ondansetron (ZOFRAN) IV   4 mg Intravenous 3 times per day  . sodium chloride  3 mL Intravenous Q12H

## 2013-10-25 NOTE — Progress Notes (Signed)
NUTRITION FOLLOW UP  Intervention:    Once PEG is cleared for use, recommend initiation of Nepro of 25 ml/hr, advance by 10 ml q 4 hours, to goal of 45 ml/hr. Add 30 ml Prostat once daily via tube. Goal regimen will provide: 2044 kcal, 102 grams protein, and 785 ml free water.  Add free water flushes of 175 ml QID -- confirmed with renal PA, Amalia Hailey. Total TF regimen + free water flushes will provide: 1485 ml free water daily.  RD to continue to follow nutrition care plan.  Nutrition Dx:   Increased nutrient needs related to wound healing, ESRD as evidenced by estimated nutrition needs, ongoing.  Goal:   Pt to meet >/= 90% of their estimated nutrition needs, unmet.  Monitor:   TF rate and tolerance, weight trends, labs, I/O's  Assessment:   63 y.o. 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  BSE completed by SLP on 3/10 with recommendations for Dysphagia 1 diet with thin liquids. Suspect continued inadequate intake 2/2 cognition. Palliative care meeting on 3/14 --> wife desires pt to continue as Full Code until mental status improves.   NGT placed 3/23 secondary to poor PO intake. Tube feeding of Nepro initiated 3/23, achieved goal rate on 3/25. Pt ordered for Nepro at 45 ml/hr and 30 ml Prostat liquid protein daily.  MRI of R tib/fib on 3/26 negative for abscess, osteo or cellulitis.   Per renal, pt with very poor QOL; unless mental status improves, pt is not appropriate for outpatient HD 2/2 safety issues.  PEG placed this morning by IR.  Potassium WNL Magnesium WNL Phosphorus elevated at 4.8 (note: phosphorus was critically low at <0.5 on 3/26 - team suspects refeeding syndrome)  Nutrition Focused Physical Exam:  Subcutaneous Fat:  Orbital Region: n/a Upper Arm Region: moderate depletion  Thoracic and Lumbar Region:  WNL  Muscle:  Temple Region: severe depletion Clavicle Bone Region: moderate depletion Clavicle and Acromion Bone Region: WNL Scapular Bone Region: moderate depletion Dorsal Hand: WNL Patellar Region: n/a Anterior Thigh Region: n/a Posterior Calf Region: n/a  Edema: none  Pt meets criteria for severe MALNUTRITION in the context of acute illness as evidenced by moderate fat and muscle mass loss; 7% wt loss x 1 month.   Height: Ht Readings from Last 1 Encounters:  10/04/13 4\' 8"  (1.422 m)  Height prior to amputations: 5'10" (177.8 cm)  Weight Status:   Wt Readings from Last 1 Encounters:  10/24/13 171 lb 15.3 oz (78 kg)  77.2 kg s/p HD on 3/28 79.2 kg s/p HD on 3/21 83.2 kg s/p HD on 3/19 72.2 kg s/p HD on 3/14 69.1 kg s/p HD on 3/12 78.4 kg s/p HD on 3/10  EDW per renal is 83 kg  Estimated needs:  Kcal: 1900-2100  Protein: 100-110 gm  Fluid: 1500 ml - discussed with renal PA (will aim to reach this with TF free water + free water flushes)  BMI: 28.1 kg/m2 -- adjusted for bilateral BKA's  Skin:  Stage II pressure ulcer on sacrum Stage II to L leg R leg incision  Diet Order: NPO   Intake/Output Summary (Last 24 hours) at 10/25/13 1143 Last data filed at 10/25/13 0600  Gross per 24 hour  Intake   1058 ml  Output    968 ml  Net     90  ml    Last BM: 3/31   Labs:   Recent Labs Lab 10/19/13 1027  10/22/13 0605 10/23/13 0634 10/24/13 0826 10/25/13 0517  NA  --   < > 139 144 142 137  K  --   < > 4.0 3.8 3.4* 3.9  CL  --   < > 95* 100 98 94*  CO2  --   < > 28 27 26 25   BUN  --   < > 17 35* 55* 34*  CREATININE  --   < > 3.55* 5.72* 7.30* 4.35*  CALCIUM  --   < > 9.8 10.0 10.0 9.6  MG  --   < > 2.3 2.4  --  2.3  PHOS <0.5*  --   --   --  4.8*  --   GLUCOSE  --   < > 169* 174* 165* 138*  < > = values in this interval not displayed.  CBG (last 3)   Recent Labs  10/25/13 0015 10/25/13 0407 10/25/13 0758  GLUCAP 177* 154* 135*    Scheduled  Meds: . bisacodyl  5 mg Oral BID  .  ceFAZolin (ANCEF) IV  2 g Intravenous Once  . darbepoetin (ARANESP) injection - DIALYSIS  25 mcg Intravenous Q Tue-HD  . feeding supplement (PRO-STAT SUGAR FREE 64)  30 mL Per Tube Q1200  . fentaNYL      . FLUoxetine  20 mg Per Tube Daily  . insulin aspart  0-20 Units Subcutaneous 6 times per day  . labetalol  200 mg Oral BID  . midazolam      . multivitamin  1 tablet Oral QHS  . neomycin-bacitracin-polymyxin   Topical TID  . ondansetron (ZOFRAN) IV  4 mg Intravenous 3 times per day  . sodium chloride  3 mL Intravenous Q12H    Continuous Infusions: . dextrose 5 % and 0.9% NaCl 20 mL/hr at 10/24/13 1807  . feeding supplement (NEPRO CARB STEADY) Stopped (10/24/13 2348)   Inda Coke MS, RD, LDN Inpatient Registered Dietitian Pager: [phone] After-hours pager: [phone]

## 2013-10-25 NOTE — Procedures (Signed)
20 Fr pull through G tube No comp 

## 2013-10-25 NOTE — Progress Notes (Addendum)
Patient [id number] D [place]      DOB: [birth year]      OIZ:[id number]  The Palliative Medicine Team has been shadowing with you on this case.  Unfortunately , family feels goals for continue curative treatment are compatible with patient's wishes.  We will sign off at this time but make our services available at such time as his spouse is open to further discussion of goals of care. Please do not hesitate to reconsult.   Rosangelica Pevehouse L. Lovena Le, MD MBA The Palliative Medicine Team at [facility] Phone: [phone] Pager: [phone]

## 2013-10-25 NOTE — Progress Notes (Signed)
I have personally seen and examined this patient and agree with the assessment/plan as outlined above by [facility] PA. Lillan Mccreadie K.,MD 10/25/2013 1:28 PM

## 2013-10-25 NOTE — Progress Notes (Signed)
[facility] PROGRESS NOTE  Oscar Castillo PZW:[id number] DOB: 05-21-1951 DOA: 10/01/2013 PCP: [name], MD  Assessment/Plan:  Encephalopathy, metabolic  -Suspect dehydration and possible underlying infectious processes as etiology; 3/13 patient's cognition appears to be clearing. Patient eating and answering simple questions  -3/17 Patient Oscar Castillo, very confused however patient S/P dialysis today   -3/20 A/O x0, very confused; did receive Ativan last night and attempt to obtain MRI -3/20 A/O. Just returned from MRI -3/30 patient's continued encephalopathy not secondary to new CVA as previously discussed with family most likely multifactorial to include dialysis, bilateral BKA, diabetes, previous CVA, depression. -3/31 patient continues to have encephalopathy multifactorial; family was on realistic expectations as to outcome.  New LBBB  -has been transient in nature  -systolic function normal but septal motion with abnormal function and dyssynergy  -cardiac enzymes negative  -doubt pt appropriate for invasive cardiac work up   Dehydration /FTT (failure to thrive) in adult  -Continue IV fluid-type of fluid adjusted by nephrology today  - Speech and Language Pathologist (SLP) attempted 10/02/2013 the patient unable to arouse enough to eval but later re examined and now on D1 diet  -Patient has begun to eat his meals., However very cachectic continue ensure 3 times a day between meals.  -IV to [facility] except when giving meds -3/25 patient pulled his NG tube; will leave out an attempt to have patient more comfortable/less agitated in order to obtain MRI of head/right BKA stump  -3/26 replace NG tube resumed feedings  - 3/30 continued NG tube feedings have discussed with wife the need for placement since patient cognition has not improved to the point where he can have adequate by mouth intake on daily basis and she has agreed. -3/30 DC'd heparin will need to restart after placement of PEG  tube in the a.m. -3/31. Tube placement delayed until 4/1 -4/1 restart heparin per pharmacy  Fever  -Unclear etiology- suspect DH and atelectasis  -question of right lower lobe right but currently is not hypoxic  -He did have leukocytosis so continue empiric antibiotics as precaution  -Patient unable to contribute to history and given altered mentation and fever an LP was done to rule out possible meningitis-CSF studies pending-partial results with 35 of protein and 156 of glucose so not indicative of bacterial meningitis-CSF culture no growth  -Influenza PCR negative  -Medication reconciliation list that patient was on doxycycline prior to admission which was started on 09/29/2013 or suspected postoperative wound infection/possible osteomyelitis and scheduled to go for 3 weeks  -Blood cultures no growth to date urinalysis unremarkable  -3/14 Right BKA stump shows abscess/osteomyelitis by MRI;; see results below  -3/17 patient remains afebrile;  -3/18 surgeon's S/P abscess drainage/revision of right BKA stump on Wednesday 3/18 -3/30 afebrile overnight   -3/31 afebrile overnight    HYPERTENSION  -Although blood pressure not within AHA guidelines will allow permissive HTN given patient's waxing and waning cognitive state, poor perfusion secondary to severe PVD   -Continue hydralazine 5 mg PRN SBP> 160 or DBP> 100 -3/30 increase labetalol 200 mg BID  -3/31 continue labetalol 200 mg BID  Anemia due to chronic illness  -Hemoglobin has trended down to 13 after hydration noting baseline just under 11  -3/17 Monitor closely currently at 9.5, repeat H./H.  -3/17 type and cross. With patient having revision in a.m. of right BKA will most likely need post surgical transfusion (would use hemoconcentrated PRBC) -3/26 stable -3/30 stable -3/31 stable -Stable  DM (diabetes mellitus)  type I controlled with renal manifestation  -On Lantus prior to admission -Patient has begun to eat at least  partial to all portions of his meals. Being supplemented by Ensure between meals.    -Continue resistant SSI; CBG currently controlled on this regimen   CKD (chronic kidney disease) stage V requiring chronic dialysis  -Usual dialysis days Tuesday Thursday Saturday  -No ultrafiltration secondary to ongoing dehydration   Chronic combined systolic (EF 95%) and grade 2 diastolic congestive heart failure  -Compensated and managed with dialysis on T/Th/Sat  HEPATITIS C, HX OF  -Liver enzymes have been stable since patient's admission  CEREBROVASCULAR ACCIDENT, HX OF -No evidence of acute infarct  -3/26 MRI of brain noncontrast negative acute finding see report below  Hyperlipidemia -lipid panel; w/i NCEP guidelines  Right BKA -Pain is increasing since surgery in February 2015. MRI 3/13 shows pathology see results below -3/14 MRI of right BKA stump shows abscess/osteomyelitis see results below -3/15 stop much less tender today, negative discharge, fibrotic tissue along the skin closure. -3/16 right BKA stump more tender today on exam. -3/17 patient's right BKA stump nontender to palpation there   -3/25 right BKA stump revision appropriately tender -3/30 MRI right BKA stump negative for osteomyelitis see results below -3/30 negative  osteomyelitis by MRI on 3/26; see results below. Patient's incision site bleeding, seems to be generalized around the sutures/staples will apply dressing. -3/31 bleeding resolved  Osteomyelitis right BKA stump -Continue  Zosyn + vancomycin -3/15 seen by Dr. Jean Rosenthal (orthopedic surgery), round discuss abnormal findings of MRI right BKA stump and decide on course of action on 3/16 (debridement, stump revision?) -Continue morphine 1-2 mg q. 4 hours PRN severe pain in right BKA stump. -Dr. Meridee Score (orthopedic surgeon) revised right BKA stump secondary abscess/osteomyelitis on 3/18   Nutrition -Nephro at 45 ml/hr       Code Status:  Full Family Communication:  None    Disposition Plan: Per notes, and wife continues to be full code, will need SNF following PEG placement     Consultants: Dr. Roney Jaffe Nephrology  Dr Meridee Score (Orthopedic Surgery) Dr. Jean Rosenthal (orthopedic surgery) Dr. Rhea Pink (palliative care) Radiology for lumbar puncture   Procedures: IR placement of 20 Fr pull through G tube placed on 10/25/2013,  3/26/to 15 MRI right tibia/fibula without contrast 1. No evidence of osteomyelitis or soft tissue abscess or  cellulitis.  2. Small hematoma in the posterior aspect of the stump.  3. Small right knee effusion, unchanged since the prior exam and  only minimally more prominent than in the opposite knee.   10/19/2013 MR head without contrast Progression of atrophy and small vessel disease. No acute  intracranial findings.  No interval change in the appearance of multiple small foci of  chronic hemorrhage.  No proximal significant flow reducing lesion is evident.   10/11/2013 revision of right BKA secondary to Osteomyelitis/Abscess  MRI tib-fib right BKA stump 10/06/2013 Findings consistent with a focal abscess at the tip of the stumps of  the distal tibia and fibula.  Subtle edema at the tip of the stumps of the tibia and fibula most  likely represent focal osteomyelitis because the fluid collections,  which probably represent pus, are immediately adjacent to the bones.  but there is no discrete bone destruction.  Probable adjacent myositis/cellulitis.   CT head without contrast 10/01/2013 1. There is no evidence of an acute intracranial hemorrhage nor of  an evolving ischemic infarction.  2. There  is stable diffuse atrophy with evidence of chronic small  vessel ischemic change.  3. There is no intracranial mass effect.  4. There is no evidence of an acute skull fracture.   Lumbar puncture by interventional radiology   Antibiotics: Zosyn 3/14 >> stopped  3/28 Vancomycin 3/14>> stopped 3/29 Aztreonam 3/08 >>> stopped 3/14 Vancomycin 3/08 >>> stopped 3/14      HPI/Subjective: 63 year old male PMHx HTN, HLD, Hx diabetes type 1 controlled, vasculopath, S/P recent right BKA on 08/2013. He also has a prior history of CVA in 2009 and is on chronic hemodialysis. In addition he has combined systolic& diastolic heart failure with an EF of 45%. According to the patient's wife he had been doing well in the postoperative period until around the same time he followed up with Dr. Sharol Given. At that time he started with poor intake and gradually increasing altered mentation. 09/24/2013 he was very confused when not work with the therapist. On 09/26/2013 a meeting was called at the nursing facility because he was experiencing extreme symptoms of failure to thrive and they were concerned the patient was depressed. Palliative care team had apparently been meeting with the family prior to admission. By 09/29/2013 he was a little more coherent and began trying to eat but later in the morning he would not take his meds kept his mouth shut.  Postoperatively he has had 2 courses of antibiotics for concerns of possible stump infection. In regards to dietary intake: in addition to the above the patient has been on a dysphagia diet with pured foods begun after the patient was noted to be coughing with attempts to eat.  Upon presentation to the emergency department EMS reported that the wife found food in the patient's mouth and he was unresponsive. His CBG was 379. He was hypoxemic and placed on a nonrebreather mask. His hemoglobin at presentation was 16.7 which was markedly elevated compared to prior readings between 9.9 and 11.5. He was also noted to have significant decreased breath sounds on the right without wheezing or crackles. The right BKA stump was unremarkable in appearance. Was also found to be mildly febrile with a rectal temperature of 101. Chest x-ray was concerning for  possible right lower lobe pneumonia. 3/12 patient just returned from HD somewhat lethargic, complained of right BKA stump pain which has been increasing over several days 3/13 patient interacting with medical staff appropriately, able to participate in dressing change of right BKA 3/14 patient alert speaking in full sentences, sitting in bed eating lunch. Writhing in pain and indicating pain is in his right BKA stump. 3/15 patient states the pain in his right BKA stump significantly decreased on the pain regimen he is on now. Negative N./V. states has been eating his meals today. States understands he has an infection in the right BKA stump and further surgery may be required. 3/16 patient able to answer some questions, and states understands the plan for right BKA stump revision.  Pain slightly increased over yesterday. 3/17 patient S/P HD, confused which is his baseline post HD 3/25 patient is lethargic however will follow some commands did cooperate with dressing change.3/26 patient arousable with extreme stimuli, stated negative SOB/CP NOTE; patient just returning from MRI, received some sedation 3/30 patient sleepy but arousable A./O. x1 (does not know when, where, why), states negative pain. 3/31 patient Oscar Castillo does follow commands 4/1[name]/O. x0 does follow commands   Objective: Filed Vitals:   10/25/13 0945 10/25/13 0950 10/25/13 1001 10/25/13 1341  BP: 119/80 142/95 128/71 148/88  Pulse: 92 93 90 87  Temp:    98 F (36.7 C)  TempSrc:    Oral  Resp: 13 13 16 18   Height:      Weight:      SpO2: 99% 96% 99% 98%    Intake/Output Summary (Last 24 hours) at 10/25/13 1730 Last data filed at 10/25/13 1500  Gross per 24 hour  Intake   1233 ml  Output      2 ml  Net   1231 ml   Filed Weights   10/24/13 0805 10/24/13 1220 10/24/13 2133  Weight: 78 kg (171 lb 15.3 oz) 77 kg (169 lb 12.1 oz) 78 kg (171 lb 15.3 oz)    Exam:   General:  A./O. x0 , does follow commands      Cardiovascular: Regular rhythm and rate, negative murmurs rubs gallop  Respiratory: Clear to auscultation bilateral  Abdomen: Soft, nontender, nondistended plus bowel sound  Musculoskeletal:20 Fr pull through G tube placed on 10/25/2013, Bilateral BKA, left BKA performed November 2014 negative tenderness to palpation; right BKA performed February 2015/with revision 10/11/2013, bleeding around incision site resolved, nontender to palpation, Staples and sutures intact.      Data Reviewed: Basic Metabolic Panel:  Recent Labs Lab 10/19/13 1027 10/20/13 0636 10/21/13 0500 10/22/13 6063 10/23/13 0634 10/24/13 0826 10/25/13 0517  NA  --  139 140 139 144 142 137  K  --  3.9 3.4* 4.0 3.8 3.4* 3.9  CL  --  93* 92* 95* 100 98 94*  CO2  --  27 28 28 27 26 25   GLUCOSE  --  104* 169* 169* 174* 165* 138*  BUN  --  22 39* 17 35* 55* 34*  CREATININE  --  3.98* 6.37* 3.55* 5.72* 7.30* 4.35*  CALCIUM  --  9.6 9.8 9.8 10.0 10.0 9.6  MG  --  2.3 2.3 2.3 2.4  --  2.3  PHOS <0.5*  --   --   --   --  4.8*  --    Liver Function Tests:  Recent Labs Lab 10/20/13 0636 10/21/13 0500 10/22/13 0605 10/23/13 0634 10/24/13 0826 10/25/13 0517  AST 35 24 33 36  --  41*  ALT 16 14 16 16   --  21  ALKPHOS 66 62 61 63  --  72  BILITOT 0.5 0.4 0.3 0.3  --  0.4  PROT 9.4* 8.6* 8.8* 8.7*  --  8.4*  ALBUMIN 2.5* 2.4* 2.3* 2.3* 2.4* 2.4*   No results found for this basename: LIPASE, AMYLASE,  in the last 168 hours No results found for this basename: AMMONIA,  in the last 168 hours CBC:  Recent Labs Lab 10/20/13 0636 10/21/13 0500 10/22/13 0605 10/23/13 0634 10/24/13 0826 10/25/13 0517  WBC 10.0 8.3 9.8 10.5 9.0 10.9*  NEUTROABS 6.9 6.0 7.3 7.0  --  7.1  HGB 10.6* 9.9* 10.5* 10.1* 9.5* 9.9*  HCT 33.0* 30.4* 32.8* 32.6* 30.1* 31.4*  MCV 87.5 86.6 89.1 90.3 89.1 89.7  PLT 501* 534* 559* 568* 596* 589*   Cardiac Enzymes: No results found for this basename: CKTOTAL, CKMB, CKMBINDEX, TROPONINI,   in the last 168 hours BNP (last 3 results)  Recent Labs  10/01/13 1135  PROBNP 1397.0*   CBG:  Recent Labs Lab 10/24/13 1958 10/25/13 0015 10/25/13 0407 10/25/13 0758 10/25/13 1137  GLUCAP 180* 177* 154* 135* 206*    Recent Results (from the past 240 hour(s))  CULTURE,  BLOOD (ROUTINE X 2)     Status: None   Collection Time    10/22/13  1:10 PM      Result Value Ref Range Status   Specimen Description BLOOD RIGHT HAND   Final   Special Requests BOTTLES DRAWN AEROBIC ONLY 3CC   Final   Culture  Setup Time     Final   Value: 10/22/2013 21:20     Performed at [facility]   Culture     Final   Value:        BLOOD CULTURE RECEIVED NO GROWTH TO DATE CULTURE WILL BE HELD FOR 5 DAYS BEFORE ISSUING A FINAL NEGATIVE REPORT     Performed at [facility]   Report Status PENDING   Incomplete  CULTURE, BLOOD (ROUTINE X 2)     Status: None   Collection Time    10/22/13  9:55 PM      Result Value Ref Range Status   Specimen Description BLOOD RIGHT HAND   Final   Special Requests BOTTLES DRAWN AEROBIC ONLY [place]   Final   Culture  Setup Time     Final   Value: 10/23/2013 01:36     Performed at [facility]   Culture     Final   Value:        BLOOD CULTURE RECEIVED NO GROWTH TO DATE CULTURE WILL BE HELD FOR 5 DAYS BEFORE ISSUING A FINAL NEGATIVE REPORT     Performed at [facility]   Report Status PENDING   Incomplete     Studies: Ir Gastrostomy Tube Mod Sed  10/25/2013   CLINICAL DATA:  Stroke  EXAM: PERCUTANEOUS GASTROSTOMY  FLUOROSCOPY TIME:  6 min and 49 seconds.  MEDICATIONS AND MEDICAL HISTORY: Versed 3 mg, Fentanyl 100 mcg.  ANESTHESIA/SEDATION: Moderate sedation time: 35 minutes  CONTRAST:  10 cc Omnipaque 300  PROCEDURE: The procedure, risks, benefits, and alternatives were explained to the patient. Questions regarding the procedure were encouraged and answered. The patient understands and consents to the procedure.  The epigastrium was prepped  with Betadine in a sterile fashion, and a sterile drape was applied covering the operative field. A sterile gown and sterile gloves were used for the procedure.  A 5-French orogastric tube is placed under fluoroscopic guidance. Scout imaging of the abdomen confirms barium within the transverse colon.  The stomach was distended with gas. Under fluoroscopic guidance, an 18 gauge needle was utilized to puncture the anterior wall of the body of the stomach. An Amplatz wire was advanced through the needle passing a T fastener into the lumen of the stomach. The T fastener was secured for gastropexy. A 9-French sheath was inserted.  A snare was advanced through the 9-French sheath. A Britta Mccreedy was advanced through the orogastric tube. It was snared then pulled out the oral cavity, pulling the snare, as well. The leading edge of the gastrostomy was attached to the snare. It was then pulled down the esophagus and out the percutaneous site. It was secured in place. Contrast was injected. No complication.  FINDINGS: The image demonstrates placement of a 20-French pull-through type gastrostomy tube into the body of the stomach.  IMPRESSION: Successful 20 French pull-through gastrostomy.   Electronically Signed   By: Maryclare Bean M.D.   On: 10/25/2013 16:18   Dg Abd Portable 1v  10/24/2013   CLINICAL DATA:  Evaluate barium prior to potential percutaneous gastrostomy tube placement  EXAM: PORTABLE ABDOMEN - 1 VIEW  COMPARISON:  DG ABD PORTABLE 1V dated 10/23/2013; CT ABD/PELVIS W CM dated 10/15/2013  FINDINGS: Administered enteric contrast is seen throughout the colon. There is a trace amount of residual barium within the gastric fundus. Enteric tube tip and side port overlie the expected location of the gastric fundus.  Nonobstructive bowel gas pattern. No supine evidence of pneumoperitoneum. No definite pneumatosis or portal venous gas.  Limited visualization of lower thorax is normal.  No acute osseus abnormalities.  IMPRESSION:  Enteric contrast throughout the colon.   Electronically Signed   By: Sandi Mariscal M.D.   On: 10/24/2013 07:51   Dg Abd Portable 1v  10/23/2013   CLINICAL DATA:  Nasogastric tube location  EXAM: PORTABLE ABDOMEN - 1 VIEW  COMPARISON:  Prior abdominal radiograph 10/15/2013  FINDINGS: The tip of the nasogastric tube is in the gastric fundus. There is a small amount of contrast material within the gastric fundus and proximal small bowel. The visualized lungs are clear. The bowel gas pattern is not obstructed.  IMPRESSION: The tip of the nasogastric tube projects over the gastric fundus.   Electronically Signed   By: Jacqulynn Cadet M.D.   On: 10/23/2013 21:29    Scheduled Meds: . bisacodyl  5 mg Oral BID  .  ceFAZolin (ANCEF) IV  2 g Intravenous Once  . darbepoetin (ARANESP) injection - DIALYSIS  25 mcg Intravenous Q Tue-HD  . feeding supplement (PRO-STAT SUGAR FREE 64)  30 mL Per Tube Q1200  . fentaNYL      . FLUoxetine  20 mg Per Tube Daily  . free water  175 mL Per Tube QID  . insulin aspart  0-20 Units Subcutaneous 6 times per day  . labetalol  200 mg Oral BID  . midazolam      . multivitamin  1 tablet Oral QHS  . neomycin-bacitracin-polymyxin   Topical TID  . ondansetron (ZOFRAN) IV  4 mg Intravenous 3 times per day  . sodium chloride  3 mL Intravenous Q12H   Continuous Infusions: . dextrose 5 % and 0.9% NaCl 20 mL/hr at 10/24/13 1807  . feeding supplement (NEPRO CARB STEADY) Stopped (10/24/13 2348)    Active Problems:   HYPERTENSION   HEPATITIS C, HX OF   CEREBROVASCULAR ACCIDENT, HX OF   BENIGN PROSTATIC HYPERTROPHY   Hyperlipidemia   Anemia due to chronic illness   DM (diabetes mellitus) type I controlled with renal manifestation   Chronic combined systolic (EF [id number]) and grade 2diastolic congestive heart failure   FTT (failure to thrive) in adult   Encephalopathy, metabolic   Dehydration   Fever    Time spent:40 minutes    WOODS, [place], J  [facility] Pager [phone]. If 7PM-7AM, please contact night-coverage at [email or web address], password [email or web address] 10/25/2013, 5:30 PM  LOS: 24 days

## 2013-10-26 LAB — HEPARIN LEVEL (UNFRACTIONATED)
HEPARIN UNFRACTIONATED: 0.28 [IU]/mL — AB (ref 0.30–0.70)
HEPARIN UNFRACTIONATED: 0.26 [IU]/mL — AB (ref 0.30–0.70)

## 2013-10-26 LAB — CBC WITH DIFFERENTIAL/PLATELET
Basophils Absolute: 0.1 10*3/uL (ref 0.0–0.1)
Basophils Relative: 1 % (ref 0–1)
Eosinophils Absolute: 0.3 10*3/uL (ref 0.0–0.7)
Eosinophils Relative: 3 % (ref 0–5)
HCT: 32.6 % — ABNORMAL LOW (ref 39.0–52.0)
Hemoglobin: 10.2 g/dL — ABNORMAL LOW (ref 13.0–17.0)
Lymphocytes Relative: 22 % (ref 12–46)
Lymphs Abs: 2.2 10*3/uL (ref 0.7–4.0)
MCH: 27.9 pg (ref 26.0–34.0)
MCHC: 31.3 g/dL (ref 30.0–36.0)
MCV: 89.3 fL (ref 78.0–100.0)
Monocytes Absolute: 1 10*3/uL (ref 0.1–1.0)
Monocytes Relative: 10 % (ref 3–12)
NEUTROS PCT: 64 % (ref 43–77)
Neutro Abs: 6.4 10*3/uL (ref 1.7–7.7)
Platelets: 605 10*3/uL — ABNORMAL HIGH (ref 150–400)
RBC: 3.65 MIL/uL — ABNORMAL LOW (ref 4.22–5.81)
RDW: 21 % — ABNORMAL HIGH (ref 11.5–15.5)
WBC: 9.9 10*3/uL (ref 4.0–10.5)

## 2013-10-26 LAB — COMPREHENSIVE METABOLIC PANEL
ALT: 16 U/L (ref 0–53)
AST: 52 U/L — ABNORMAL HIGH (ref 0–37)
Albumin: 2.4 g/dL — ABNORMAL LOW (ref 3.5–5.2)
Alkaline Phosphatase: 72 U/L (ref 39–117)
BILIRUBIN TOTAL: 0.4 mg/dL (ref 0.3–1.2)
BUN: 44 mg/dL — AB (ref 6–23)
CHLORIDE: 93 meq/L — AB (ref 96–112)
CO2: 23 mEq/L (ref 19–32)
Calcium: 9.7 mg/dL (ref 8.4–10.5)
Creatinine, Ser: 6.43 mg/dL — ABNORMAL HIGH (ref 0.50–1.35)
GFR calc Af Amer: 10 mL/min — ABNORMAL LOW (ref >90)
GFR calc non Af Amer: 8 mL/min — ABNORMAL LOW (ref >90)
Glucose, Bld: 94 mg/dL (ref 70–99)
Potassium: 3.9 mEq/L (ref 3.7–5.3)
Sodium: 139 mEq/L (ref 137–147)
Total Protein: 8.6 g/dL — ABNORMAL HIGH (ref 6.0–8.3)

## 2013-10-26 LAB — GLUCOSE, CAPILLARY
GLUCOSE-CAPILLARY: 103 mg/dL — AB (ref 70–99)
GLUCOSE-CAPILLARY: 94 mg/dL (ref 70–99)
Glucose-Capillary: 130 mg/dL — ABNORMAL HIGH (ref 70–99)
Glucose-Capillary: 151 mg/dL — ABNORMAL HIGH (ref 70–99)
Glucose-Capillary: 92 mg/dL (ref 70–99)

## 2013-10-26 LAB — AMMONIA: AMMONIA: 10 umol/L — AB (ref 11–60)

## 2013-10-26 LAB — MAGNESIUM: MAGNESIUM: 2.3 mg/dL (ref 1.5–2.5)

## 2013-10-26 MED ORDER — SODIUM CHLORIDE 0.9 % IV SOLN
100.0000 mL | INTRAVENOUS | Status: DC | PRN
Start: 2013-10-26 — End: 2013-10-28

## 2013-10-26 MED ORDER — NEPRO/CARBSTEADY PO LIQD: 237.0000 mL | ORAL | Status: DC | PRN

## 2013-10-26 MED ORDER — SODIUM CHLORIDE 0.9 % IV SOLN: 100.0000 mL | INTRAVENOUS | Status: DC | PRN

## 2013-10-26 MED ORDER — HEPARIN SODIUM (PORCINE) 1000 UNIT/ML DIALYSIS
1000.0000 [IU] | INTRAMUSCULAR | Status: DC | PRN
  Filled 2013-10-26: qty 1

## 2013-10-26 MED ORDER — PENTAFLUOROPROP-TETRAFLUOROETH EX AERO: 1.0000 "application " | INHALATION_SPRAY | CUTANEOUS | Status: DC | PRN

## 2013-10-26 MED ORDER — ALTEPLASE 2 MG IJ SOLR
2.0000 mg | Freq: Once | INTRAMUSCULAR | Status: AC | PRN
  Filled 2013-10-26: qty 2

## 2013-10-26 MED ORDER — HEPARIN (PORCINE) IN NACL 100-0.45 UNIT/ML-% IJ SOLN
1100.0000 [IU]/h | INTRAMUSCULAR | Status: DC
  Administered 2013-10-26: 1100 [IU]/h via INTRAVENOUS
  Administered 2013-10-26: 1000 [IU]/h via INTRAVENOUS
  Filled 2013-10-26 (×2): qty 250

## 2013-10-26 MED ORDER — LIDOCAINE HCL (PF) 1 % IJ SOLN: 5.0000 mL | INTRAMUSCULAR | Status: DC | PRN

## 2013-10-26 MED ORDER — HEPARIN SODIUM (PORCINE) 1000 UNIT/ML DIALYSIS
20.0000 [IU]/kg | INTRAMUSCULAR | Status: DC | PRN
  Filled 2013-10-26: qty 2

## 2013-10-26 MED ORDER — LIDOCAINE-PRILOCAINE 2.5-2.5 % EX CREA: 1.0000 "application " | TOPICAL_CREAM | CUTANEOUS | Status: DC | PRN

## 2013-10-26 NOTE — Progress Notes (Signed)
I have personally seen and examined this patient and agree with the assessment/plan as outlined above by [facility] PA. Tania Perrott K.,MD 10/26/2013 9:42 AM

## 2013-10-26 NOTE — Progress Notes (Signed)
ANTICOAGULATION CONSULT NOTE - Initial Consult  Pharmacy Consult for heparin Indication: VTE prophylaxis  No Known Allergies  Patient Measurements: Height:  (bilateral amputee) Weight: 176 lb 5.9 oz (80 kg) IBW/kg (Calculated) : 40.8  Vital Signs: Temp: 98.5 F (36.9 C) (04/01 2149) Temp src: Axillary (04/01 2149) BP: 107/61 mmHg (04/01 2149) Pulse Rate: 77 (04/01 2149)  Labs:  Recent Labs  10/23/13 0634 10/23/13 1829 10/24/13 0826 10/25/13 0517  HGB 10.1*  --  9.5* 9.9*  HCT 32.6*  --  30.1* 31.4*  PLT 568*  --  596* 589*  APTT  --  36  --   --   LABPROT  --  13.2  --   --   INR  --  1.02  --   --   CREATININE 5.72*  --  7.30* 4.35*    Estimated Creatinine Clearance: 14.1 ml/min (by C-G formula based on Cr of 4.35).   Medical History: Past Medical History  Diagnosis Date  . ESRD on hemodialysis 05/05/2007    ESRD due to DM/HTN. Started dialysis in November 2013.  HD TTS at [facility] on [address].  [name] BACK PAIN, LUMBAR, CHRONIC 08/06/2009  . BENIGN PROSTATIC HYPERTROPHY 08/01/2010  . CEREBROVASCULAR ACCIDENT, HX OF 08/06/2009  . CHOLELITHIASIS 08/01/2010  . CONGESTIVE HEART FAILURE 03/18/2009  . DEPRESSION 03/18/2009  . DIABETES MELLITUS, TYPE II 03/25/2007  . ERECTILE DYSFUNCTION 03/25/2007  . GERD 03/25/2007  . HEPATITIS C, HX OF 03/25/2007  . HYPERTENSION 03/25/2007  . Morbid obesity 03/25/2007  . NEPHROLITHIASIS, HX OF 03/18/2009  . Complication of anesthesia     wife states pt had trouble waking up with his last surgery in Nov., 2014    Medications:  Prescriptions prior to admission  Medication Sig Dispense Refill  . acetaminophen (TYLENOL) 500 MG tablet Take 500 mg by mouth every 6 (six) hours as needed for mild pain.      [name] amLODipine (NORVASC) 10 MG tablet Take 10 mg by mouth daily.      . Ascorbic Acid (VITAMIN C PO) Take 1 tablet by mouth daily.      . brimonidine (ALPHAGAN) 0.2 % ophthalmic solution Place 1 drop into both eyes 2 (two) times daily.       . Brinzolamide-Brimonidine (SIMBRINZA) 1-0.2 % SUSP Place 1 drop into both eyes 2 (two) times daily.       . calcium acetate (PHOSLO) 667 MG capsule Take 2 capsules (1,334 mg total) by mouth 3 (three) times daily with meals.  180 capsule  0  . docusate sodium (COLACE) 100 MG capsule Take 1 capsule (100 mg total) by mouth 2 (two) times daily.  10 capsule  0  . doxycycline (ADOXA) 100 MG tablet Take 100 mg by mouth 2 (two) times daily. Started on 09/29/13 X 3 weeks      . gabapentin (NEURONTIN) 300 MG capsule Take 300 mg by mouth daily as needed (for mild to moderate pain).       . haloperidol (HALDOL) 5 MG tablet Take 5 mg by mouth at bedtime as needed for agitation.      [name] HYDROcodone-acetaminophen (NORCO) 7.5-325 MG per tablet Take 1 tablet by mouth every 6 (six) hours as needed for moderate pain.  120 tablet  0  . Insulin Glargine (LANTUS SOLOSTAR [place]) Inject 5 Units into the skin every evening.      . labetalol (NORMODYNE) 300 MG tablet Take 1 tablet (300 mg total) by mouth 3 (three) times daily.  90 tablet  6  . saccharomyces boulardii (FLORASTOR) 250 MG capsule Take 250 mg by mouth 2 (two) times daily.      . sertraline (ZOLOFT) 100 MG tablet Take 100 mg by mouth daily as needed. For depression      . silver sulfADIAZINE (SILVADENE) 1 % cream Apply 1 application topically daily.      . Travoprost, BAK Free, (TRAVATAN) 0.004 % SOLN ophthalmic solution Place 1 drop into both eyes at bedtime.  1 Bottle  0  . traZODone (DESYREL) 50 MG tablet Take 50 mg by mouth at bedtime as needed for sleep.       Scheduled:  . bisacodyl  5 mg Oral BID  .  ceFAZolin (ANCEF) IV  2 g Intravenous Once  . darbepoetin (ARANESP) injection - DIALYSIS  25 mcg Intravenous Q Tue-HD  . feeding supplement (PRO-STAT SUGAR FREE 64)  30 mL Per Tube Q1200  . FLUoxetine  20 mg Per Tube Daily  . free water  175 mL Per Tube QID  . insulin aspart  0-20 Units Subcutaneous 6 times per day  . labetalol  200 mg Oral BID  .  multivitamin  1 tablet Oral QHS  . neomycin-bacitracin-polymyxin   Topical TID  . ondansetron (ZOFRAN) IV  4 mg Intravenous 3 times per day  . sodium chloride  3 mL Intravenous Q12H   Infusions:  . dextrose 5 % and 0.9% NaCl 20 mL/hr at 10/24/13 1807  . feeding supplement (NEPRO CARB STEADY) Stopped (10/24/13 2348)    Assessment: 63yo male w/ complex hospital course over past two months and low QOL had right BKA infection/abcess treated for 14d, family wishes to continue full care so PEG tube was placed 4/1, now to begin heparin for VTE Px, was previously on Coumadin.  Goal of Therapy:  Heparin level 0.3-0.7 units/ml Monitor platelets by anticoagulation protocol: Yes   Plan:  Will start heparin gtt at 0600 this am at 1000 units/hr and monitor heparin levels and CBC.  Wynona Neat, PharmD, BCPS  10/26/2013,12:11 AM

## 2013-10-26 NOTE — Progress Notes (Signed)
[facility] Progress Note  Assessment/Plan:  1. AMS - Sluggish improvement. The etiology appears multifactorial (depression/ hx cva/metabolic encephalopathy) Imaging of brain - no acute findings - see #9 2. Bleeding s/p R BKA revision 3/18 - MRI Rt tib/fib on 3/26 negative for abscess, osteo or cellulitis, but small hematoma and effusion present. Mgmt per ortho  3. ESRD - TTS -for HD today 4. Anemia - Hgb 9.9 stable Continue Aranesp 25 Q Tuesday. Last Tsat 40 on 2/28. No Fe for now.  5. HTN/ volume - on labetalol BID BP variable - UF 967 cc Tuesday; goal 1 L to start adjust per crit line 6. MBD - Hypercalcemia worsening. Corr Ca 10.8  If from TF should be lower since hasn't had any over 24 hours; alb low; Nepro has 250 mg per 8 oz Continue holding Vit D. Low phos off binders Start with 2 Ca bath  today and continue as K allows - suspect he will have to change to 4 K 7. Malnutrition - PEG tube placed yesterday  - supposed to start feedings today per nursing 8. Hep C + LFT ok  9. FTT/debiliatated/from SNF - Palliative care met with pt's wife on 3/22, full code per family; I believe this needs to be revisited; his QOL is very poor. Unless is mental status becomes clearer, I do not think he is appropriate for outpt dialysis due to safety issues- He needs LTAC if he is to continue dialysis 10. Depression - seen by psych - liquid prozac started 3/23 - hard to assess improvement   Myriam Jacobson, PA-C [place] [phone] 10/26/2013,8:37 AM  LOS: 25 days   Subjective:   "how are you";  told me he was "so-so". When asked if he wanted to continue dialysis, he said yes.  Could not tell me where he was.  Objective Filed Vitals:   10/25/13 1341 10/25/13 1800 10/25/13 2149 10/26/13 0606  BP: 148/88 104/60 107/61 122/69  Pulse: 87 88 77 81  Temp: 98 F (36.7 C) 98 F (36.7 C) 98.5 F (36.9 C) 98.5 F (36.9 C)  TempSrc: Oral Oral Axillary Oral  Resp: _0 Height:      Weight:   80 kg (176 lb 5.9 oz)   SpO2: 98% 96% 100% 100%   Physical Exam General: NAD supine in bed awake Heart: RRR Lungs: no wheezes or rales Abdomen: GTube in place, soft + BS Extremities: bilateral BKA without edema; left BKA has  hard ulcerated area about patella; several scabbed areas distal stump; right BKA wrapped Dialysis Access: left upper AVF  Dialysis Orders: [facility] @ [facility]  4:15 83 kg 500/A1.5 2K/2Ca Heparin 5000 U, intermittent dose of 2400 U AVF @ LFA  Hectorol 4 mcg Epogen 0 Venofer 100 mg x 10 (through 2/24)  Additional Objective Labs: Basic Metabolic Panel:  Recent Labs Lab 10/19/13 1027  10/23/13 0634 10/24/13 0826 10/25/13 0517  NA  --   < > 144 142 137  K  --   < > 3.8 3.4* 3.9  CL  --   < > 100 98 94*  CO2  --   < > _1 GLUCOSE  --   < > 174* 165* 138*  BUN  --   < > 35* 55* 34*  CREATININE  --   < > 5.72* 7.30* 4.35*  CALCIUM  --   < > 10.0 10.0 9.6  PHOS <0.5*  --   --  4.8*  --   < > = values in this interval not displayed. Liver Function Tests:  Recent Labs Lab 10/22/13 0605 10/23/13 0634 10/24/13 0826 10/25/13 0517  AST 33 36  --  41*  ALT 16 16  --  21  ALKPHOS 61 63  --  72  BILITOT 0.3 0.3  --  0.4  PROT 8.8* 8.7*  --  8.4*  ALBUMIN 2.3* 2.3* 2.4* 2.4*   CBC:  Recent Labs Lab 10/21/13 0500 10/22/13 0605 10/23/13 0634 10/24/13 0826 10/25/13 0517  WBC 8.3 9.8 10.5 9.0 10.9*  NEUTROABS 6.0 7.3 7.0  --  7.1  HGB 9.9* 10.5* 10.1* 9.5* 9.9*  HCT 30.4* 32.8* 32.6* 30.1* 31.4*  MCV 86.6 89.1 90.3 89.1 89.7  PLT 534* 559* 568* 596* 589*   Blood Culture    Component Value Date/Time   SDES BLOOD RIGHT HAND 10/22/2013 2155   SPECREQUEST BOTTLES DRAWN AEROBIC ONLY [facility] 10/22/2013 2155   CULT  Value:        BLOOD CULTURE RECEIVED NO GROWTH TO DATE CULTURE WILL BE HELD FOR 5 DAYS BEFORE ISSUING A FINAL NEGATIVE REPORT Performed at [place] 10/22/2013 2155   REPTSTATUS PENDING 10/22/2013 2155    CBG:  Recent Labs Lab 10/25/13 1137 10/25/13 1646 10/25/13 2000 10/26/13 0015 10/26/13 0410  GLUCAP 206* 112* 105* 130* 92  Medications: . dextrose 5 % and 0.9% NaCl 20 mL/hr at 10/24/13 1807  . feeding supplement (NEPRO CARB STEADY) Stopped (10/24/13 2348)  . heparin 1,000 Units/hr (10/26/13 0555)   . bisacodyl  5 mg Oral BID  .  ceFAZolin (ANCEF) IV  2 g Intravenous Once  . darbepoetin (ARANESP) injection - DIALYSIS  25 mcg Intravenous Q Tue-HD  . feeding supplement (PRO-STAT SUGAR FREE 64)  30 mL Per Tube Q1200  . FLUoxetine  20 mg Per Tube Daily  . free water  175 mL Per Tube QID  . insulin aspart  0-20 Units Subcutaneous 6 times per day  . labetalol  200 mg Oral BID  . multivitamin  1 tablet Oral QHS  . neomycin-bacitracin-polymyxin   Topical TID  . ondansetron (ZOFRAN) IV  4 mg Intravenous 3 times per day  . sodium chloride  3 mL Intravenous Q12H

## 2013-10-26 NOTE — Care Management Note (Signed)
[facility] ) following this pt and assessing for possible admission, however pt needs Hep B drawn and sent to that facility to complete the evaluation. Pt RN contacting MD to order this as a current one is not available. Will followup tomorrow.  Jasmine Pang RN MPH, case manager, [phone]

## 2013-10-26 NOTE — Progress Notes (Signed)
[facility] PROGRESS NOTE  Oscar Castillo:[id number] DOB: June 02, 1951 DOA: 10/01/2013 PCP: [name], MD  Assessment/Plan: Encephalopathy, metabolic  -Suspect dehydration and possible underlying infectious processes as etiology;  patient's cognition appears to be clearing. Patient eating and answering simple questions  - MRI negative for acute stroke.  -check ammonia level.    Dehydration /FTT (failure to thrive) in adult  -on D1 diet   Osteomyelitis right BKA stump  -Pain is increasing since surgery in February 2015. MRI 3/13 shows pathology see results below  -3/14 MRI of right BKA stump shows abscess/osteomyelitis see results below  -3/30 MRI right BKA stump negative for osteomyelitis see results below  -not on antibiotics, will contact ortho regarding need for antibiotics.  -3/15 seen by Dr. Jean Rosenthal (orthopedic surgery), round discuss abnormal findings of MRI right BKA stump and decide on course of action on 3/16 (debridement, stump revision?)  -Dr. Meridee Score (orthopedic surgeon) revised right BKA stump secondary abscess/osteomyelitis on 3/18     HYPERTENSION  -Continue hydralazine 5 mg PRN SBP> 160 or DBP> 100  -3/31 continue labetalol 200 mg BID   Anemia due to chronic illness  Follow trend. Hb at 9.   DM (diabetes mellitus) type I controlled with renal manifestation  -Continue resistant SSI; CBG currently controlled on this regimen   ESRD;  -Usual dialysis days Tuesday Thursday Saturday   Chronic combined systolic (EF 75%) and grade 2 diastolic congestive heart failure  -Compensated and managed with dialysis on T/Th/Sat   HEPATITIS C, HX OF  -Liver enzymes have been stable since patient's admission   CEREBROVASCULAR ACCIDENT, HX OF  -No evidence of acute infarct  -3/26 MRI of brain noncontrast negative acute finding    Fever ; resolved.  -Patient unable to contribute to history and given altered mentation and fever an LP was done to rule  out possible meningitis-CSF studies pending-partial results with 35 of protein and 156 of glucose so not indicative of bacterial meningitis-CSF culture no growth  -Influenza PCR negative  -Blood cultures no growth to date urinalysis unremarkable  -3/14 Right BKA stump shows abscess/osteomyelitis by MRI;; see results below  -3/18 surgeon's S/P abscess drainage/revision of right BKA stump on Wednesday 3/18   New LBBB  -has been transient in nature  -systolic function normal but septal motion with abnormal function and dyssynergy  -cardiac enzymes negative   Nutrition  -Nephro at 45 ml/hr   Code Status: Full Code.  Family Communication: none at bedside.  Disposition Plan: to be determine.    Consultants: Dr. Roney Jaffe Nephrology  Dr Meridee Score (Orthopedic Surgery)  Dr. Jean Rosenthal (orthopedic surgery)  Dr. Rhea Pink (palliative care)  Radiology for lumbar puncture   Procedures: IR placement of 20 Fr pull through G tube placed on 10/25/2013,  3/26/to 15 MRI right tibia/fibula without contrast  1. No evidence of osteomyelitis or soft tissue abscess or  cellulitis.  2. Small hematoma in the posterior aspect of the stump.  3. Small right knee effusion, unchanged since the prior exam and  only minimally more prominent than in the opposite knee.   10/19/2013 MR head without contrast  Progression of atrophy and small vessel disease. No acute  intracranial findings.  No interval change in the appearance of multiple small foci of  chronic hemorrhage.  No proximal significant flow reducing lesion is evident.   10/11/2013 revision of right BKA secondary to Osteomyelitis/Abscess  MRI tib-fib right BKA stump 10/06/2013  Findings consistent with a focal  abscess at the tip of the stumps of  the distal tibia and fibula.  Subtle edema at the tip of the stumps of the tibia and fibula most  likely represent focal osteomyelitis because the fluid collections,  which probably  represent pus, are immediately adjacent to the bones.  but there is no discrete bone destruction.  Probable adjacent myositis/cellulitis.   CT head without contrast 10/01/2013  1. There is no evidence of an acute intracranial hemorrhage nor of  an evolving ischemic infarction.  2. There is stable diffuse atrophy with evidence of chronic small  vessel ischemic change.  3. There is no intracranial mass effect.  4. There is no evidence of an acute skull fracture.  Lumbar puncture by interventional radiology   Antibiotics: Zosyn 3/14 >> stopped 3/28  Vancomycin 3/14>> stopped 3/29  Aztreonam 3/08 >>> stopped 3/14  Vancomycin 3/08 >>> stopped 3/14    HPI/Subjective: Open eyes to command. Wake up to answer to questions. Answer yes and no.   Objective: Filed Vitals:   10/26/13 1300  BP: 122/88  Pulse: 84  Temp:   Resp: 18    Intake/Output Summary (Last 24 hours) at 10/26/13 1411 Last data filed at 10/26/13 0900  Gross per 24 hour  Intake 350.83 ml  Output      1 ml  Net 349.83 ml   Filed Weights   10/24/13 2133 10/25/13 2149 10/26/13 1140  Weight: 78 kg (171 lb 15.3 oz) 80 kg (176 lb 5.9 oz) 78 kg (171 lb 15.3 oz)    Exam:   General: no distress. Wake up to answer simple questions.   Cardiovascular: S 1, S 2 RRR  Respiratory: CTA  Abdomen: Bs present, soft, nt  Musculoskeletal: dressing right BKA.   Data Reviewed: Basic Metabolic Panel:  Recent Labs Lab 10/21/13 0500 10/22/13 0605 10/23/13 0634 10/24/13 0826 10/25/13 0517 10/26/13 0750  NA 140 139 144 142 137 139  K 3.4* 4.0 3.8 3.4* 3.9 3.9  CL 92* 95* 100 98 94* 93*  CO2 28 28 27 26 25 23   GLUCOSE 169* 169* 174* 165* 138* 94  BUN 39* 17 35* 55* 34* 44*  CREATININE 6.37* 3.55* 5.72* 7.30* 4.35* 6.43*  CALCIUM 9.8 9.8 10.0 10.0 9.6 9.7  MG 2.3 2.3 2.4  --  2.3 2.3  PHOS  --   --   --  4.8*  --   --    Liver Function Tests:  Recent Labs Lab 10/21/13 0500 10/22/13 0605 10/23/13 0634  10/24/13 0826 10/25/13 0517 10/26/13 0750  AST 24 33 36  --  41* 52*  ALT 14 16 16   --  21 16  ALKPHOS 62 61 63  --  72 72  BILITOT 0.4 0.3 0.3  --  0.4 0.4  PROT 8.6* 8.8* 8.7*  --  8.4* 8.6*  ALBUMIN 2.4* 2.3* 2.3* 2.4* 2.4* 2.4*   No results found for this basename: LIPASE, AMYLASE,  in the last 168 hours No results found for this basename: AMMONIA,  in the last 168 hours CBC:  Recent Labs Lab 10/21/13 0500 10/22/13 0605 10/23/13 0634 10/24/13 0826 10/25/13 0517 10/26/13 0750  WBC 8.3 9.8 10.5 9.0 10.9* 9.9  NEUTROABS 6.0 7.3 7.0  --  7.1 6.4  HGB 9.9* 10.5* 10.1* 9.5* 9.9* 10.2*  HCT 30.4* 32.8* 32.6* 30.1* 31.4* 32.6*  MCV 86.6 89.1 90.3 89.1 89.7 89.3  PLT 534* 559* 568* 596* 589* 605*   Cardiac Enzymes: No results found for this  basename: CKTOTAL, CKMB, CKMBINDEX, TROPONINI,  in the last 168 hours BNP (last 3 results)  Recent Labs  10/01/13 1135  PROBNP 1397.0*   CBG:  Recent Labs Lab 10/25/13 1646 10/25/13 2000 10/26/13 0015 10/26/13 0410 10/26/13 0856  GLUCAP 112* 105* 130* 92 103*    Recent Results (from the past 240 hour(s))  CULTURE, BLOOD (ROUTINE X 2)     Status: None   Collection Time    10/22/13  1:10 PM      Result Value Ref Range Status   Specimen Description BLOOD RIGHT HAND   Final   Special Requests BOTTLES DRAWN AEROBIC ONLY 3CC   Final   Culture  Setup Time     Final   Value: 10/22/2013 21:20     Performed at [facility]   Culture     Final   Value:        BLOOD CULTURE RECEIVED NO GROWTH TO DATE CULTURE WILL BE HELD FOR 5 DAYS BEFORE ISSUING A FINAL NEGATIVE REPORT     Performed at [facility]   Report Status PENDING   Incomplete  CULTURE, BLOOD (ROUTINE X 2)     Status: None   Collection Time    10/22/13  9:55 PM      Result Value Ref Range Status   Specimen Description BLOOD RIGHT HAND   Final   Special Requests BOTTLES DRAWN AEROBIC ONLY [place]   Final   Culture  Setup Time     Final   Value: 10/23/2013  01:36     Performed at [facility]   Culture     Final   Value:        BLOOD CULTURE RECEIVED NO GROWTH TO DATE CULTURE WILL BE HELD FOR 5 DAYS BEFORE ISSUING A FINAL NEGATIVE REPORT     Performed at [facility]   Report Status PENDING   Incomplete     Studies: Ir Gastrostomy Tube Mod Sed  10/25/2013   CLINICAL DATA:  Stroke  EXAM: PERCUTANEOUS GASTROSTOMY  FLUOROSCOPY TIME:  6 min and 49 seconds.  MEDICATIONS AND MEDICAL HISTORY: Versed 3 mg, Fentanyl 100 mcg.  ANESTHESIA/SEDATION: Moderate sedation time: 35 minutes  CONTRAST:  10 cc Omnipaque 300  PROCEDURE: The procedure, risks, benefits, and alternatives were explained to the patient. Questions regarding the procedure were encouraged and answered. The patient understands and consents to the procedure.  The epigastrium was prepped with Betadine in a sterile fashion, and a sterile drape was applied covering the operative field. A sterile gown and sterile gloves were used for the procedure.  A 5-French orogastric tube is placed under fluoroscopic guidance. Scout imaging of the abdomen confirms barium within the transverse colon.  The stomach was distended with gas. Under fluoroscopic guidance, an 18 gauge needle was utilized to puncture the anterior wall of the body of the stomach. An Amplatz wire was advanced through the needle passing a T fastener into the lumen of the stomach. The T fastener was secured for gastropexy. A 9-French sheath was inserted.  A snare was advanced through the 9-French sheath. A Britta Mccreedy was advanced through the orogastric tube. It was snared then pulled out the oral cavity, pulling the snare, as well. The leading edge of the gastrostomy was attached to the snare. It was then pulled down the esophagus and out the percutaneous site. It was secured in place. Contrast was injected. No complication.  FINDINGS: The image demonstrates placement of a 20-French pull-through type gastrostomy  tube into the body of the  stomach.  IMPRESSION: Successful 20 French pull-through gastrostomy.   Electronically Signed   By: Maryclare Bean M.D.   On: 10/25/2013 16:18    Scheduled Meds: . bisacodyl  5 mg Oral BID  .  ceFAZolin (ANCEF) IV  2 g Intravenous Once  . darbepoetin (ARANESP) injection - DIALYSIS  25 mcg Intravenous Q Tue-HD  . feeding supplement (PRO-STAT SUGAR FREE 64)  30 mL Per Tube Q1200  . FLUoxetine  20 mg Per Tube Daily  . free water  175 mL Per Tube QID  . insulin aspart  0-20 Units Subcutaneous 6 times per day  . labetalol  200 mg Oral BID  . multivitamin  1 tablet Oral QHS  . neomycin-bacitracin-polymyxin   Topical TID  . ondansetron (ZOFRAN) IV  4 mg Intravenous 3 times per day  . sodium chloride  3 mL Intravenous Q12H   Continuous Infusions: . dextrose 5 % and 0.9% NaCl 20 mL/hr at 10/24/13 1807  . feeding supplement (NEPRO CARB STEADY) Stopped (10/24/13 2348)  . heparin 1,000 Units/hr (10/26/13 0555)    Active Problems:   HYPERTENSION   HEPATITIS C, HX OF   CEREBROVASCULAR ACCIDENT, HX OF   BENIGN PROSTATIC HYPERTROPHY   Hyperlipidemia   Anemia due to chronic illness   DM (diabetes mellitus) type I controlled with renal manifestation   Chronic combined systolic (EF [id number]) and grade 2diastolic congestive heart failure   FTT (failure to thrive) in adult   Encephalopathy, metabolic   Dehydration   Fever    Time spent: 35 minutes.     Niel Hummer A  [facility] Pager [phone]. If 7PM-7AM, please contact night-coverage at [email or web address], password [facility] 10/26/2013, 2:11 PM  LOS: 25 days

## 2013-10-26 NOTE — Progress Notes (Signed)
PT Cancellation Note  Patient Details Name: EPHRIAM TURMAN MRN: [id number] DOB: March 17, 1951   Cancelled Treatment:    Reason Eval/Treat Not Completed: Patient at procedure or test/unavailable; attempted to see patient today.  Was in dialysis.  Will attempt to see tomorrow.   WYNN,CYNDI 10/26/2013, 3:50 PM

## 2013-10-26 NOTE — Progress Notes (Signed)
ANTICOAGULATION CONSULT NOTE - Initial Consult  Pharmacy Consult for heparin Indication: VTE prophylaxis  No Known Allergies  Patient Measurements: Height:  (bilateral amputee) Weight: 171 lb 15.3 oz (78 kg) IBW/kg (Calculated) : 40.8 Heparin dosing weight: 59.1 kg  Vital Signs: Temp: 97.6 F (36.4 C) (04/02 1140) Temp src: Oral (04/02 1140) BP: 102/73 mmHg (04/02 1430) Pulse Rate: 85 (04/02 1430)  Labs:  Recent Labs  10/23/13 1829  10/24/13 0826 10/25/13 0517 10/26/13 0750 10/26/13 1400  HGB  --   < > 9.5* 9.9* 10.2*  --   HCT  --   --  30.1* 31.4* 32.6*  --   PLT  --   --  596* 589* 605*  --   APTT 36  --   --   --   --   --   LABPROT 13.2  --   --   --   --   --   INR 1.02  --   --   --   --   --   HEPARINUNFRC  --   --   --   --   --  0.28*  CREATININE  --   --  7.30* 4.35* 6.43*  --   < > = values in this interval not displayed.  Estimated Creatinine Clearance: 9.4 ml/min (by C-G formula based on Cr of 6.43).   Medical History: Past Medical History  Diagnosis Date  . ESRD on hemodialysis 05/05/2007    ESRD due to DM/HTN. Started dialysis in November 2013.  HD TTS at [facility] on [facility].  [name] BACK PAIN, LUMBAR, CHRONIC 08/06/2009  . BENIGN PROSTATIC HYPERTROPHY 08/01/2010  . CEREBROVASCULAR ACCIDENT, HX OF 08/06/2009  . CHOLELITHIASIS 08/01/2010  . CONGESTIVE HEART FAILURE 03/18/2009  . DEPRESSION 03/18/2009  . DIABETES MELLITUS, TYPE II 03/25/2007  . ERECTILE DYSFUNCTION 03/25/2007  . GERD 03/25/2007  . HEPATITIS C, HX OF 03/25/2007  . HYPERTENSION 03/25/2007  . Morbid obesity 03/25/2007  . NEPHROLITHIASIS, HX OF 03/18/2009  . Complication of anesthesia     wife states pt had trouble waking up with his last surgery in Nov., 2014    Medications:  Prescriptions prior to admission  Medication Sig Dispense Refill  . acetaminophen (TYLENOL) 500 MG tablet Take 500 mg by mouth every 6 (six) hours as needed for mild pain.      [name] amLODipine (NORVASC) 10 MG tablet  Take 10 mg by mouth daily.      . Ascorbic Acid (VITAMIN C PO) Take 1 tablet by mouth daily.      . brimonidine (ALPHAGAN) 0.2 % ophthalmic solution Place 1 drop into both eyes 2 (two) times daily.      . Brinzolamide-Brimonidine (SIMBRINZA) 1-0.2 % SUSP Place 1 drop into both eyes 2 (two) times daily.       . calcium acetate (PHOSLO) 667 MG capsule Take 2 capsules (1,334 mg total) by mouth 3 (three) times daily with meals.  180 capsule  0  . docusate sodium (COLACE) 100 MG capsule Take 1 capsule (100 mg total) by mouth 2 (two) times daily.  10 capsule  0  . doxycycline (ADOXA) 100 MG tablet Take 100 mg by mouth 2 (two) times daily. Started on 09/29/13 X 3 weeks      . gabapentin (NEURONTIN) 300 MG capsule Take 300 mg by mouth daily as needed (for mild to moderate pain).       . haloperidol (HALDOL) 5 MG tablet Take 5 mg by  mouth at bedtime as needed for agitation.      [name] HYDROcodone-acetaminophen (NORCO) 7.5-325 MG per tablet Take 1 tablet by mouth every 6 (six) hours as needed for moderate pain.  120 tablet  0  . Insulin Glargine (LANTUS SOLOSTAR Seibert) Inject 5 Units into the skin every evening.      . labetalol (NORMODYNE) 300 MG tablet Take 1 tablet (300 mg total) by mouth 3 (three) times daily.  90 tablet  6  . saccharomyces boulardii (FLORASTOR) 250 MG capsule Take 250 mg by mouth 2 (two) times daily.      . sertraline (ZOLOFT) 100 MG tablet Take 100 mg by mouth daily as needed. For depression      . silver sulfADIAZINE (SILVADENE) 1 % cream Apply 1 application topically daily.      . Travoprost, BAK Free, (TRAVATAN) 0.004 % SOLN ophthalmic solution Place 1 drop into both eyes at bedtime.  1 Bottle  0  . traZODone (DESYREL) 50 MG tablet Take 50 mg by mouth at bedtime as needed for sleep.       Scheduled:  . bisacodyl  5 mg Oral BID  .  ceFAZolin (ANCEF) IV  2 g Intravenous Once  . darbepoetin (ARANESP) injection - DIALYSIS  25 mcg Intravenous Q Tue-HD  . feeding supplement (PRO-STAT SUGAR  FREE 64)  30 mL Per Tube Q1200  . FLUoxetine  20 mg Per Tube Daily  . free water  175 mL Per Tube QID  . insulin aspart  0-20 Units Subcutaneous 6 times per day  . labetalol  200 mg Oral BID  . multivitamin  1 tablet Oral QHS  . neomycin-bacitracin-polymyxin   Topical TID  . ondansetron (ZOFRAN) IV  4 mg Intravenous 3 times per day  . sodium chloride  3 mL Intravenous Q12H   Infusions:  . feeding supplement (NEPRO CARB STEADY) Stopped (10/24/13 2348)  . heparin 1,000 Units/hr (10/26/13 0555)    Assessment: 63yo male w/ complex hospital course over past two months and low QOL had right BKA infection/abcess treated for 14d, family wishes to continue full care so PEG tube was placed 4/1, now to begin heparin for VTE Px, was previously on Coumadin short term for VTE px following BKA.  Goal of Therapy:  Heparin level 0.3-0.7 units/ml Monitor platelets by anticoagulation protocol: Yes   Plan:  Increase heparin gtt to 1100 units/hr Daily HL, CBC Next HL Valinda, PharmD, BCPS Clinical Pharmacist Pager: [phone] 10/26/2013 2:50 PM

## 2013-10-26 NOTE — Progress Notes (Signed)
NUTRITION FOLLOW UP  Pt meets criteria for severe MALNUTRITION in the context of acute illness as evidenced by moderate fat and muscle mass loss; 7% wt loss x 1 month.  Intervention:    Initiate Nepro at 25 ml/hr via PEG, advance by 10 ml q 4 hours, to goal of 45 ml/hr. Add 30 ml Prostat once daily via tube. Goal regimen will provide: 2044 kcal, 102 grams protein, and 785 ml free water.  Continue free water flushes of 175 ml QID -- confirmed with renal PA, Amalia Hailey 4/1. Total TF regimen + free water flushes will provide: 1485 ml free water daily.  RD to continue to follow nutrition care plan.  Nutrition Dx:   Increased nutrient needs related to wound healing, ESRD as evidenced by estimated nutrition needs, ongoing.  Goal:   Pt to meet >/= 90% of their estimated nutrition needs, unmet.  Monitor:   TF rate and tolerance, weight trends, labs, I/O's  Assessment:   63 y.o. 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  BSE completed by SLP on 3/10 with recommendations for Dysphagia 1 diet with thin liquids. Suspect continued inadequate intake 2/2 cognition. Palliative care meeting on 3/14 --> wife desires pt to continue as Full Code until mental status improves.   NGT placed 3/23 secondary to poor PO intake. Tube feeding of Nepro initiated 3/23, achieved goal rate on 3/25. Pt ordered for Nepro at 45 ml/hr and 30 ml Prostat liquid protein daily.  MRI of R tib/fib on 3/26 negative for abscess, osteo or cellulitis.   Per renal, pt with very poor QOL; unless mental status improves, pt is not appropriate for outpatient HD 2/2 safety issues.  PEG placed 4/1. RD received verbal RN consult to start feedings.  Potassium WNL Magnesium WNL Phosphorus elevated at 4.8 (note: phosphorus was critically low at <0.5 on 3/26 - team suspects refeeding  syndrome)   Height: Ht Readings from Last 1 Encounters:  10/04/13 4\' 8"  (1.422 m)  Height prior to amputations: 5'10" (177.8 cm)  Weight Status:   Wt Readings from Last 1 Encounters:  10/26/13 171 lb 15.3 oz (78 kg)  77.2 kg s/p HD on 3/28 79.2 kg s/p HD on 3/21 83.2 kg s/p HD on 3/19 72.2 kg s/p HD on 3/14 69.1 kg s/p HD on 3/12 78.4 kg s/p HD on 3/10  EDW per renal is 83 kg  Estimated needs:  Kcal: 1900-2100  Protein: 100-110 gm  Fluid: 1500 ml - discussed with renal PA (will aim to reach this with TF free water + free water flushes)  BMI: 28.1 kg/m2 -- adjusted for bilateral BKA's  Skin:  Stage II pressure ulcer on sacrum Stage II to L leg R leg incision  Diet Order: NPO   Intake/Output Summary (Last 24 hours) at 10/26/13 1407 Last data filed at 10/26/13 0900  Gross per 24 hour  Intake 350.83 ml  Output      1 ml  Net 349.83 ml    Last BM: 4/1   Labs:   Recent Labs Lab 10/23/13 0634 10/24/13 0826 10/25/13 0517 10/26/13 0750  NA 144 142 137 139  K 3.8 3.4* 3.9 3.9  CL 100 98 94* 93*  CO2 27 26 25 23   BUN 35* 55* 34* 44*  CREATININE 5.72* 7.30* 4.35* 6.43*  CALCIUM 10.0 10.0 9.6 9.7  MG  2.4  --  2.3 2.3  PHOS  --  4.8*  --   --   GLUCOSE 174* 165* 138* 94    CBG (last 3)   Recent Labs  10/26/13 0015 10/26/13 0410 10/26/13 0856  GLUCAP 130* 92 103*    Scheduled Meds: . bisacodyl  5 mg Oral BID  .  ceFAZolin (ANCEF) IV  2 g Intravenous Once  . darbepoetin (ARANESP) injection - DIALYSIS  25 mcg Intravenous Q Tue-HD  . feeding supplement (PRO-STAT SUGAR FREE 64)  30 mL Per Tube Q1200  . FLUoxetine  20 mg Per Tube Daily  . free water  175 mL Per Tube QID  . insulin aspart  0-20 Units Subcutaneous 6 times per day  . labetalol  200 mg Oral BID  . multivitamin  1 tablet Oral QHS  . neomycin-bacitracin-polymyxin   Topical TID  . ondansetron (ZOFRAN) IV  4 mg Intravenous 3 times per day  . sodium chloride  3 mL Intravenous Q12H     Continuous Infusions: . dextrose 5 % and 0.9% NaCl 20 mL/hr at 10/24/13 1807  . feeding supplement (NEPRO CARB STEADY) Stopped (10/24/13 2348)  . heparin 1,000 Units/hr (10/26/13 0555)    Inda Coke MS, RD, LDN Inpatient Registered Dietitian Pager: [phone] After-hours pager: [phone]

## 2013-10-26 NOTE — Procedures (Signed)
Patient seen on Hemodialysis. QB 300, UF goal 1.5L Treatment adjusted as needed.  Elmarie Shiley MD [facility]. Office # [phone] Pager # [phone] 12:06 PM

## 2013-10-27 LAB — CBC WITH DIFFERENTIAL/PLATELET
BASOS PCT: 0 % (ref 0–1)
Basophils Absolute: 0 10*3/uL (ref 0.0–0.1)
EOS PCT: 2 % (ref 0–5)
Eosinophils Absolute: 0.2 10*3/uL (ref 0.0–0.7)
HEMATOCRIT: 32.4 % — AB (ref 39.0–52.0)
HEMOGLOBIN: 10.3 g/dL — AB (ref 13.0–17.0)
LYMPHS PCT: 18 % (ref 12–46)
Lymphs Abs: 1.5 10*3/uL (ref 0.7–4.0)
MCH: 28.2 pg (ref 26.0–34.0)
MCHC: 31.8 g/dL (ref 30.0–36.0)
MCV: 88.8 fL (ref 78.0–100.0)
MONOS PCT: 10 % (ref 3–12)
Monocytes Absolute: 0.9 10*3/uL (ref 0.1–1.0)
NEUTROS ABS: 6 10*3/uL (ref 1.7–7.7)
Neutrophils Relative %: 70 % (ref 43–77)
RBC: 3.65 MIL/uL — ABNORMAL LOW (ref 4.22–5.81)
RDW: 21.8 % — ABNORMAL HIGH (ref 11.5–15.5)
WBC: 8.6 10*3/uL (ref 4.0–10.5)

## 2013-10-27 LAB — COMPREHENSIVE METABOLIC PANEL
ALBUMIN: 2.5 g/dL — AB (ref 3.5–5.2)
ALT: 11 U/L (ref 0–53)
AST: 47 U/L — ABNORMAL HIGH (ref 0–37)
Alkaline Phosphatase: 76 U/L (ref 39–117)
BUN: 25 mg/dL — ABNORMAL HIGH (ref 6–23)
CALCIUM: 9.3 mg/dL (ref 8.4–10.5)
CO2: 24 mEq/L (ref 19–32)
Chloride: 93 mEq/L — ABNORMAL LOW (ref 96–112)
Creatinine, Ser: 3.62 mg/dL — ABNORMAL HIGH (ref 0.50–1.35)
GFR calc non Af Amer: 17 mL/min — ABNORMAL LOW (ref >90)
GFR, EST AFRICAN AMERICAN: 19 mL/min — AB (ref >90)
GLUCOSE: 139 mg/dL — AB (ref 70–99)
POTASSIUM: 4.2 meq/L (ref 3.7–5.3)
Sodium: 136 mEq/L — ABNORMAL LOW (ref 137–147)
TOTAL PROTEIN: 8.4 g/dL — AB (ref 6.0–8.3)
Total Bilirubin: 0.3 mg/dL (ref 0.3–1.2)

## 2013-10-27 LAB — GLUCOSE, CAPILLARY
GLUCOSE-CAPILLARY: 144 mg/dL — AB (ref 70–99)
GLUCOSE-CAPILLARY: 196 mg/dL — AB (ref 70–99)
GLUCOSE-CAPILLARY: 160 mg/dL — AB (ref 70–99)
Glucose-Capillary: 116 mg/dL — ABNORMAL HIGH (ref 70–99)
Glucose-Capillary: 117 mg/dL — ABNORMAL HIGH (ref 70–99)
Glucose-Capillary: 125 mg/dL — ABNORMAL HIGH (ref 70–99)
Glucose-Capillary: 156 mg/dL — ABNORMAL HIGH (ref 70–99)

## 2013-10-27 LAB — MAGNESIUM: Magnesium: 2 mg/dL (ref 1.5–2.5)

## 2013-10-27 MED ORDER — HEPARIN SODIUM (PORCINE) 5000 UNIT/ML IJ SOLN
5000.0000 [IU] | Freq: Three times a day (TID) | INTRAMUSCULAR | Status: DC
  Administered 2013-10-27 – 2013-10-31 (×14): 5000 [IU] via SUBCUTANEOUS
  Filled 2013-10-27 (×16): qty 1

## 2013-10-27 NOTE — Progress Notes (Signed)
Patient may dc home

## 2013-10-27 NOTE — Progress Notes (Signed)
I have personally seen and examined this patient and agree with the assessment/plan as outlined above by Cletus Gash PA. Arryanna Holquin K.,MD 10/27/2013 10:05 AM

## 2013-10-27 NOTE — Progress Notes (Signed)
[facility] PROGRESS NOTE  Oscar Castillo FTD:[id number] DOB: 11-17-1950 DOA: 10/01/2013 PCP: [name], MD  Assessment/Plan: Encephalopathy, metabolic;  -Suspect dehydration and possible underlying infectious processes as etiology;  patient's cognition appears to be clearing. Patient eating and answering simple questions  - MRI negative for acute stroke.  -ammonia level 10.   -peg tube in place for nutrition.   Osteomyelitis right BKA stump  -Pain is increasing since surgery in February 2015. MRI 3/13 shows pathology see results below  -3/14 MRI of right BKA stump shows abscess/osteomyelitis see results below  -3/30 MRI right BKA stump negative for osteomyelitis see results below  -3/15 seen by Dr. Jean Rosenthal (orthopedic surgery), round discuss abnormal findings of MRI right BKA stump and decide on course of action on 3/16 (debridement, stump revision?)  -Dr. Meridee Score (orthopedic surgeon) revised right BKA stump secondary abscess/osteomyelitis on 3/18  -not on antibiotics, will contact ortho regarding need for antibiotics. Awaiting note from Dr Sharol Given.   HYPERTENSION  -Continue hydralazine 5 mg PRN SBP> 160 or DBP> 100  -continue labetalol 200 mg BID   Anemia due to chronic illness  Follow trend. Hb at 9.   DM (diabetes mellitus) type I controlled with renal manifestation  -Continue resistant SSI; CBG currently controlled on this regimen   ESRD;  -Usual dialysis days Tuesday Thursday Saturday   Chronic combined systolic (EF 06%) and grade 2 diastolic congestive heart failure  -Compensated and managed with dialysis on T/Th/Sat   HEPATITIS C, HX OF  -Liver enzymes have been stable since patient's admission   CEREBROVASCULAR ACCIDENT, HX OF  -No evidence of acute infarct  -3/26 MRI of brain noncontrast negative acute finding    Fever ; resolved.  -Patient unable to contribute to history and given altered mentation and fever an LP was done to rule out possible  meningitis-CSF studies pending-partial results with 35 of protein and 156 of glucose so not indicative of bacterial meningitis-CSF culture no growth  -Influenza PCR negative  -Blood cultures no growth to date urinalysis unremarkable  -3/14 Right BKA stump shows abscess/osteomyelitis by MRI;; see results below  -3/18 surgeon's S/P abscess drainage/revision of right BKA stump on Wednesday 3/18   New LBBB  -has been transient in nature  -systolic function normal but septal motion with abnormal function and dyssynergy  -cardiac enzymes negative   Nutrition  -Nephro at 45 ml/hr   Code Status: Full Code.  Family Communication: updated wife.  Disposition Plan: LTAC when bed available and hepatitis lab available. Hopefully tomorrow.    Consultants: Dr. Roney Jaffe Nephrology  Dr Meridee Score (Orthopedic Surgery)  Dr. Jean Rosenthal (orthopedic surgery)  Dr. Rhea Pink (palliative care)  Radiology for lumbar puncture   Procedures: IR placement of 20 Fr pull through G tube placed on 10/25/2013,  3/26/to 15 MRI right tibia/fibula without contrast  1. No evidence of osteomyelitis or soft tissue abscess or  cellulitis.  2. Small hematoma in the posterior aspect of the stump.  3. Small right knee effusion, unchanged since the prior exam and  only minimally more prominent than in the opposite knee.   10/19/2013 MR head without contrast  Progression of atrophy and small vessel disease. No acute  intracranial findings.  No interval change in the appearance of multiple small foci of  chronic hemorrhage.  No proximal significant flow reducing lesion is evident.   10/11/2013 revision of right BKA secondary to Osteomyelitis/Abscess  MRI tib-fib right BKA stump 10/06/2013  Findings consistent with  a focal abscess at the tip of the stumps of  the distal tibia and fibula.  Subtle edema at the tip of the stumps of the tibia and fibula most  likely represent focal osteomyelitis because  the fluid collections,  which probably represent pus, are immediately adjacent to the bones.  but there is no discrete bone destruction.  Probable adjacent myositis/cellulitis.   CT head without contrast 10/01/2013  1. There is no evidence of an acute intracranial hemorrhage nor of  an evolving ischemic infarction.  2. There is stable diffuse atrophy with evidence of chronic small  vessel ischemic change.  3. There is no intracranial mass effect.  4. There is no evidence of an acute skull fracture.  Lumbar puncture by interventional radiology   Antibiotics: Zosyn 3/14 >> stopped 3/28  Vancomycin 3/14>> stopped 3/29  Aztreonam 3/08 >>> stopped 3/14  Vancomycin 3/08 >>> stopped 3/14    HPI/Subjective: Awake, alert. Answer yes and no to questions.   Objective: Filed Vitals:   10/27/13 1111  BP: 123/74  Pulse: 86  Temp: 97.8 F (36.6 C)  Resp: 18    Intake/Output Summary (Last 24 hours) at 10/27/13 1402 Last data filed at 10/27/13 1224  Gross per 24 hour  Intake 426.42 ml  Output   1001 ml  Net -574.58 ml   Filed Weights   10/25/13 2149 10/26/13 1140 10/26/13 2035  Weight: 80 kg (176 lb 5.9 oz) 78 kg (171 lb 15.3 oz) 76 kg (167 lb 8.8 oz)    Exam:   General: no distress. Awake.   Cardiovascular: S 1, S 2 RRR  Respiratory: CTA  Abdomen: Bs present, soft, nt  Musculoskeletal: dressing right BKA.   Data Reviewed: Basic Metabolic Panel:  Recent Labs Lab 10/22/13 0605 10/23/13 0634 10/24/13 0826 10/25/13 0517 10/26/13 0750 10/27/13 0600  NA 139 144 142 137 139 136*  K 4.0 3.8 3.4* 3.9 3.9 4.2  CL 95* 100 98 94* 93* 93*  CO2 28 27 26 25 23 24   GLUCOSE 169* 174* 165* 138* 94 139*  BUN 17 35* 55* 34* 44* 25*  CREATININE 3.55* 5.72* 7.30* 4.35* 6.43* 3.62*  CALCIUM 9.8 10.0 10.0 9.6 9.7 9.3  MG 2.3 2.4  --  2.3 2.3 2.0  PHOS  --   --  4.8*  --   --   --    Liver Function Tests:  Recent Labs Lab 10/22/13 0605 10/23/13 0634 10/24/13 0826  10/25/13 0517 10/26/13 0750 10/27/13 0600  AST 33 36  --  41* 52* 47*  ALT 16 16  --  21 16 11   ALKPHOS 61 63  --  72 72 76  BILITOT 0.3 0.3  --  0.4 0.4 0.3  PROT 8.8* 8.7*  --  8.4* 8.6* 8.4*  ALBUMIN 2.3* 2.3* 2.4* 2.4* 2.4* 2.5*   No results found for this basename: LIPASE, AMYLASE,  in the last 168 hours  Recent Labs Lab 10/26/13 1300  AMMONIA 10*   CBC:  Recent Labs Lab 10/22/13 0605 10/23/13 0634 10/24/13 0826 10/25/13 0517 10/26/13 0750 10/27/13 0600  WBC 9.8 10.5 9.0 10.9* 9.9 8.6  NEUTROABS 7.3 7.0  --  7.1 6.4 6.0  HGB 10.5* 10.1* 9.5* 9.9* 10.2* 10.3*  HCT 32.8* 32.6* 30.1* 31.4* 32.6* 32.4*  MCV 89.1 90.3 89.1 89.7 89.3 88.8  PLT 559* 568* 596* 589* 605* PLATELET CLUMPS NOTED ON SMEAR, COUNT APPEARS INCREASED   Cardiac Enzymes: No results found for this basename: CKTOTAL, CKMB, CKMBINDEX,  TROPONINI,  in the last 168 hours BNP (last 3 results)  Recent Labs  10/01/13 1135  PROBNP 1397.0*   CBG:  Recent Labs Lab 10/26/13 2033 10/27/13 0017 10/27/13 0407 10/27/13 0805 10/27/13 1110  GLUCAP 151* 125* 116* 156* 144*    Recent Results (from the past 240 hour(s))  CULTURE, BLOOD (ROUTINE X 2)     Status: None   Collection Time    10/22/13  1:10 PM      Result Value Ref Range Status   Specimen Description BLOOD RIGHT HAND   Final   Special Requests BOTTLES DRAWN AEROBIC ONLY 3CC   Final   Culture  Setup Time     Final   Value: 10/22/2013 21:20     Performed at [facility]   Culture     Final   Value:        BLOOD CULTURE RECEIVED NO GROWTH TO DATE CULTURE WILL BE HELD FOR 5 DAYS BEFORE ISSUING A FINAL NEGATIVE REPORT     Performed at [facility]   Report Status PENDING   Incomplete  CULTURE, BLOOD (ROUTINE X 2)     Status: None   Collection Time    10/22/13  9:55 PM      Result Value Ref Range Status   Specimen Description BLOOD RIGHT HAND   Final   Special Requests BOTTLES DRAWN AEROBIC ONLY [place]   Final   Culture   Setup Time     Final   Value: 10/23/2013 01:36     Performed at [facility]   Culture     Final   Value:        BLOOD CULTURE RECEIVED NO GROWTH TO DATE CULTURE WILL BE HELD FOR 5 DAYS BEFORE ISSUING A FINAL NEGATIVE REPORT     Performed at [facility]   Report Status PENDING   Incomplete     Studies: No results found.  Scheduled Meds: . bisacodyl  5 mg Oral BID  .  ceFAZolin (ANCEF) IV  2 g Intravenous Once  . darbepoetin (ARANESP) injection - DIALYSIS  25 mcg Intravenous Q Tue-HD  . feeding supplement (PRO-STAT SUGAR FREE 64)  30 mL Per Tube Q1200  . FLUoxetine  20 mg Per Tube Daily  . free water  175 mL Per Tube QID  . heparin subcutaneous  5,000 Units Subcutaneous 3 times per day  . insulin aspart  0-20 Units Subcutaneous 6 times per day  . labetalol  200 mg Oral BID  . multivitamin  1 tablet Oral QHS  . neomycin-bacitracin-polymyxin   Topical TID  . ondansetron (ZOFRAN) IV  4 mg Intravenous 3 times per day  . sodium chloride  3 mL Intravenous Q12H   Continuous Infusions: . feeding supplement (NEPRO CARB STEADY) 1,000 mL (10/27/13 0223)    Principal Problem:   Encephalopathy, metabolic Active Problems:   HYPERTENSION   HEPATITIS C, HX OF   CEREBROVASCULAR ACCIDENT, HX OF   BENIGN PROSTATIC HYPERTROPHY   Hyperlipidemia   Anemia due to chronic illness   DM (diabetes mellitus) type I controlled with renal manifestation   Chronic combined systolic (EF 42%) and grade 2diastolic congestive heart failure   FTT (failure to thrive) in adult   Dehydration   Fever    Time spent: 25 minutes.     Niel Hummer A  [facility] Pager [phone]. If 7PM-7AM, please contact night-coverage at [email or web address], password [facility] 10/27/2013, 2:02 PM  LOS: 26 days

## 2013-10-27 NOTE — Progress Notes (Signed)
[facility] Progress Note   Assessment/Plan:  1. AMS - Sluggish improvement. The etiology appears multifactorial (depression/ hx cva/metabolic encephalopathy) Imaging of brain - no acute findings - see #9 2. Bleeding s/p R BKA revision 3/18 - MRI Rt tib/fib on 3/26 negative for abscess, osteo or cellulitis. Mgmt per ortho  3. ESRD - TTS - for HD tomorrow, K+4.2 4. Anemia - Hgb improving 10.3  Continue Aranesp 25 Q Tuesday. Last Tsat 40 on 2/28. No Fe for now.  5. HTN/ volume - SBPs 110s-120s on labetalol BID  Net UF 1L on Thurs. No overt excess 6. MBD - Hypercalcemia ongoing, but improving some. Corr Ca 10.5 Continue holding Vit D. Phos creeping up, will continue to hold binders for phos < 5.5.  Use 2 Ca bath as K allows 7. Malnutrition - GT tube, Nepro 81m/hr,multivit 8. Hep C + LFT ok  9. FTT/debiliatated/from SNF - Palliative care met with pt's wife on 3/22, full code per family; I believe this needs to be revisited; his QOL is very poor. Unless is mental status becomes clearer, I do not think he is appropriate for outpt dialysis due to safety issues- He needs LTAC if he is to continue dialysis 10. Depression - seen by psych - liquid prozac started 3/23 - hard to assess improvement   KCollene Leyden WRhodia [facility] Pager [phone]/09/2013,9:04 AM  LOS: 26 days    Subjective:   Sleepy. Says I'm "ok", but does not respond otherwise.  Wife present. States he c/o of back and R stump pain earlier and has just received morphine.   Objective Filed Vitals:   10/26/13 1641 10/26/13 2035 10/27/13 0408 10/27/13 0806  BP: 124/81 127/86 112/78 115/73  Pulse: 88 89 83 85  Temp: 97.6 F (36.4 C) 98.9 F (37.2 C) 98.3 F (36.8 C) 98 F (36.7 C)  TempSrc: Oral Oral Oral Oral  Resp: '18 17 17 16  ' Height:      Weight:  76 kg (167 lb 8.8 oz)    SpO2: 100% 100% 100% 100%   Physical Exam General: Sleeply, minimal communication, NAD Heart: RRR Lungs: No  wheezes or rales Abdomen: Scaphoid, G tube in place, +BS Extremities: L BKA with several scabbed areas of ulceration distally, R BKA wrapped Dialysis Access: LAVF + bruit  Dialysis Orders: T[facility] @ [facility]  4:15 83 kg (lower at d/c 73-76kg?)500/A1.5 2K/2Ca Heparin 5000 U, intermittent dose of 2400 U AVF @ [facility]FA  Hectorol 4 mcg Epogen 0 Venofer 0  (Fe load completed 2/24)  Additional Objective Labs: Basic Metabolic Panel:  Recent Labs Lab 10/24/13 0826 10/25/13 0517 10/26/13 0750 10/27/13 0600  NA 142 137 139 136*  K 3.4* 3.9 3.9 4.2  CL 98 94* 93* 93*  CO2 '26 25 23 24  ' GLUCOSE 165* 138* 94 139*  BUN 55* 34* 44* 25*  CREATININE 7.30* 4.35* 6.43* 3.62*  CALCIUM 10.0 9.6 9.7 9.3  PHOS 4.8*  --   --   --    Liver Function Tests:  Recent Labs Lab 10/25/13 0517 10/26/13 0750 10/27/13 0600  AST 41* 52* 47*  ALT '21 16 11  ' ALKPHOS 72 72 76  BILITOT 0.4 0.4 0.3  PROT 8.4* 8.6* 8.4*  ALBUMIN 2.4* 2.4* 2.5*   CBC:  Recent Labs Lab 10/23/13 0634 10/24/13 0826 10/25/13 0517 10/26/13 0750 10/27/13 0600  WBC 10.5 9.0 10.9* 9.9 8.6  NEUTROABS 7.0  --  7.1 6.4 6.0  HGB 10.1* 9.5* 9.9* 10.2*  10.3*  HCT 32.6* 30.1* 31.4* 32.6* 32.4*  MCV 90.3 89.1 89.7 89.3 88.8  PLT 568* 596* 589* 605* PLATELET CLUMPS NOTED ON SMEAR, COUNT APPEARS INCREASED   Blood Culture    Component Value Date/Time   SDES BLOOD RIGHT HAND 10/22/2013 2155   SPECREQUEST BOTTLES DRAWN AEROBIC ONLY [facility] 10/22/2013 2155   CULT  Value:        BLOOD CULTURE RECEIVED NO GROWTH TO DATE CULTURE WILL BE HELD FOR 5 DAYS BEFORE ISSUING A FINAL NEGATIVE REPORT Performed at [facility] 10/22/2013 2155   REPTSTATUS PENDING 10/22/2013 2155     Recent Labs Lab 10/26/13 1639 10/26/13 2033 10/27/13 0017 10/27/13 0407 10/27/13 0805  GLUCAP 94 151* 125* 116* 156*   Studies/Results: Ir Gastrostomy Tube Mod Sed  10/25/2013   CLINICAL DATA:  Stroke  EXAM: PERCUTANEOUS GASTROSTOMY  FLUOROSCOPY TIME:  6 min and 49  seconds.  MEDICATIONS AND MEDICAL HISTORY: Versed 3 mg, Fentanyl 100 mcg.  ANESTHESIA/SEDATION: Moderate sedation time: 35 minutes  CONTRAST:  10 cc Omnipaque 300  PROCEDURE: The procedure, risks, benefits, and alternatives were explained to the patient. Questions regarding the procedure were encouraged and answered. The patient understands and consents to the procedure.  The epigastrium was prepped with Betadine in a sterile fashion, and a sterile drape was applied covering the operative field. A sterile gown and sterile gloves were used for the procedure.  A 5-French orogastric tube is placed under fluoroscopic guidance. Scout imaging of the abdomen confirms barium within the transverse colon.  The stomach was distended with gas. Under fluoroscopic guidance, an 18 gauge needle was utilized to puncture the anterior wall of the body of the stomach. An Amplatz wire was advanced through the needle passing a T fastener into the lumen of the stomach. The T fastener was secured for gastropexy. A 9-French sheath was inserted.  A snare was advanced through the 9-French sheath. A Britta Mccreedy was advanced through the orogastric tube. It was snared then pulled out the oral cavity, pulling the snare, as well. The leading edge of the gastrostomy was attached to the snare. It was then pulled down the esophagus and out the percutaneous site. It was secured in place. Contrast was injected. No complication.  FINDINGS: The image demonstrates placement of a 20-French pull-through type gastrostomy tube into the body of the stomach.  IMPRESSION: Successful 20 French pull-through gastrostomy.   Electronically Signed   By: Maryclare Bean M.D.   On: 10/25/2013 16:18   Medications: . feeding supplement (NEPRO CARB STEADY) 1,000 mL (10/27/13 0223)   . bisacodyl  5 mg Oral BID  .  ceFAZolin (ANCEF) IV  2 g Intravenous Once  . darbepoetin (ARANESP) injection - DIALYSIS  25 mcg Intravenous Q Tue-HD  . feeding supplement (PRO-STAT SUGAR FREE 64)   30 mL Per Tube Q1200  . FLUoxetine  20 mg Per Tube Daily  . free water  175 mL Per Tube QID  . heparin subcutaneous  5,000 Units Subcutaneous 3 times per day  . insulin aspart  0-20 Units Subcutaneous 6 times per day  . labetalol  200 mg Oral BID  . multivitamin  1 tablet Oral QHS  . neomycin-bacitracin-polymyxin   Topical TID  . ondansetron (ZOFRAN) IV  4 mg Intravenous 3 times per day  . sodium chloride  3 mL Intravenous Q12H

## 2013-10-27 NOTE — Progress Notes (Signed)
Physical Therapy Treatment Patient Details Name: ONTARIO PETTENGILL MRN: [id number] DOB: 08-26-1950 Today's Date: 10/27/2013    History of Present Illness 63 y.o. male admitted to [facility] on 10/01/13 with AMS and FTT.  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 Of note, he has history of L BKA as well. Pt with significant PMHx of diabetes and severe peripheral vascular disease who also has end-stage renal disease and undergoes dialysis.  S/p revision right transtibial amputation 3/18    PT Comments    Patient tolerated out of bed about 3 hours today, but was unsafe for functional transfers due to sliding down so far in chair.  Would need total lift to/from chair, but has potential to tolerate outpatient dialysis.  Follow Up Recommendations  SNF;Supervision/Assistance - 24 hour     Equipment Recommendations  None recommended by PT    Recommendations for Other Services       Precautions / Restrictions Precautions Precautions: Fall Precaution Comments: pt also with poor vision, wife reporting he has to have cataract surgery Restrictions Other Position/Activity Restrictions: bil BKA    Mobility  Bed Mobility Overal bed mobility: Needs Assistance   Rolling: Mod assist Sidelying to sit: Max assist;+2 for safety/equipment       General bed mobility comments: assist to scoot to head of bed +2   Transfers Overall transfer level: Needs assistance   Transfers: Lateral/Scoot Transfers       Anterior-Posterior transfers: +2 physical assistance;+2 safety/equipment;Total assist  Lateral/Scoot Transfers: +2 physical assistance;Total assist General transfer comment: assisted tech to get pt back to bed after up in chair about 3 hours using linen under patient and lowering rails to slide over due to pt scooted out to edge of chair and unsafe to sit up   Ambulation/Gait                 Stairs            Wheelchair Mobility   Modified Rankin (Stroke  Patients Only)       Balance                              Cognition Arousal/Alertness: Awake/alert Behavior During Therapy: Flat affect Overall Cognitive Status: Impaired/Different from baseline     Current Attention Level: Focused   Following Commands: Follows one step commands inconsistently     Problem Solving: Slow processing;Decreased initiation;Requires verbal cues;Requires tactile cues      Exercises     General Comments        Pertinent Vitals/Pain Indicated pain with transfer, but comfortable after positioned in bed    Home Living                      Prior Function            PT Goals (current goals can now be found in the care plan section) Acute Rehab PT Goals Time For Goal Achievement: 11/10/13 Potential to Achieve Goals: Fair Progress towards PT goals: Progressing toward goals    Frequency  Min 2X/week    PT Plan Current plan remains appropriate    Co-evaluation             End of Session Equipment Utilized During Treatment: Gait belt Activity Tolerance: Patient limited by fatigue Patient left: in bed;with call bell/phone within reach     Time: 4196-2229 PT Time Calculation (min): 13 min  Charges:  $Therapeutic Activity: 8-22 mins                    G Codes:      WYNN,CYNDI 11-15-2013, 6:16 PM Magda Kiel, [place] 11/15/2013

## 2013-10-27 NOTE — Progress Notes (Signed)
Physical Therapy Treatment Patient Details Name: Oscar Castillo MRN: [id number] DOB: June 21, 1951 Today's Date: 10/27/2013    History of Present Illness 63 y.o. male admitted to [facility] on 10/01/13 with AMS and FTT.  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 Of note, he has history of L BKA as well. Pt with significant PMHx of diabetes and severe peripheral vascular disease who also has end-stage renal disease and undergoes dialysis.  S/p revision right transtibial amputation 3/18    PT Comments    Patient able to participate in out of bed activity.  Tolerated wheelchair short time and able to transfer into recliner to attempt to improve sitting tolerance for outpatient dialysis.  Patient also stooling frequently so RN aware and, due to occasional agitation, pt left at nursing station for safety.  Follow Up Recommendations  SNF;Supervision/Assistance - 24 hour     Equipment Recommendations  None recommended by PT    Recommendations for Other Services       Precautions / Restrictions Precautions Precautions: Fall Precaution Comments: pt also with poor vision, wife reporting he has to have cataract surgery Restrictions Other Position/Activity Restrictions: bil BKA    Mobility  Bed Mobility Overal bed mobility: Needs Assistance   Rolling: Mod assist Sidelying to sit: Max assist;+2 for safety/equipment       General bed mobility comments: rolling with lower body more easily than upper due to decreased UE use  Transfers Overall transfer level: Needs assistance   Transfers: Anterior-Posterior Transfer;Lateral/Scoot Transfers       Anterior-Posterior transfers: +2 physical assistance;+2 safety/equipment;Total assist  Lateral/Scoot Transfers: Total assist;+2 physical assistance;With slide board General transfer comment: from wheelchair to recliner; multiple trials to complete transfer due to pt leaning back and assist for anterior weight shift; A-P transfer bed  to wheelchair using pad under patient from air bed  Ambulation/Gait                 Hotel manager parts: Needs assistance Wheelchair Assistance Details (indicate cue type and reason): attempted to engage pt in propulsion, but non functional use of UE's due to cognition and stiffness; total assist to propel around unit  Modified Rankin (Stroke Patients Only)       Balance     Sitting balance-Leahy Scale: Poor Sitting balance - Comments: leans posterior, but in wheelchair, pt initiated anterior weight shift on several occasions; in wheelchair total of about 15 minutes Postural control: Posterior lean                          Cognition Arousal/Alertness: Awake/alert Behavior During Therapy: Flat affect Overall Cognitive Status: Impaired/Different from baseline     Current Attention Level: Focused   Following Commands: Follows one step commands inconsistently     Problem Solving: Slow processing;Decreased initiation;Requires verbal cues;Requires tactile cues      Exercises General Exercises - Upper Extremity Shoulder Flexion: AAROM;5 reps;Seated Other Exercises Other Exercises: cervical AAROM rotation, flex/ext seated in chair    General Comments        Pertinent Vitals/Pain Indicated discomfort in chair; placed pillow under patient for comfort    Home Living                      Prior Function            PT Goals (current goals  can now be found in the care plan section) Acute Rehab PT Goals Time For Goal Achievement: 11/10/13 Potential to Achieve Goals: Fair Progress towards PT goals: Progressing toward goals    Frequency  Min 2X/week    PT Plan Current plan remains appropriate    Co-evaluation             End of Session Equipment Utilized During Treatment: Gait belt Activity Tolerance: Patient limited by fatigue Patient left: in chair;Other (comment) (at  nurses station)     Time: 1320-1405 PT Time Calculation (min): 45 min  Charges:  $Therapeutic Activity: 38-52 mins                    G Codes:      WYNN,CYNDI Nov 25, 2013, 3:07 PM Magda Kiel, [place] 2013-11-25

## 2013-10-27 NOTE — Progress Notes (Signed)
Patient ID: Oscar Castillo, male   DOB: [birth year], 63 y.o.   MRN: [id number] Patient is status post revision right transtibial amputation. The wound edges are approximated well. There is no ischemic changes without gangrenous changes. There is a small amount of drainage of resolving hematoma. I feel it is safe for patient to be discharged with dressing changes daily with compression wrap with an Ace. Will followup in the office in 2 weeks.

## 2013-10-28 LAB — CBC WITH DIFFERENTIAL/PLATELET
BASOS PCT: 0 % (ref 0–1)
Basophils Absolute: 0 10*3/uL (ref 0.0–0.1)
EOS ABS: 0.2 10*3/uL (ref 0.0–0.7)
Eosinophils Relative: 2 % (ref 0–5)
HCT: 31.8 % — ABNORMAL LOW (ref 39.0–52.0)
HEMOGLOBIN: 10.1 g/dL — AB (ref 13.0–17.0)
LYMPHS PCT: 22 % (ref 12–46)
Lymphs Abs: 1.7 10*3/uL (ref 0.7–4.0)
MCH: 28.7 pg (ref 26.0–34.0)
MCHC: 31.8 g/dL (ref 30.0–36.0)
MCV: 90.3 fL (ref 78.0–100.0)
Monocytes Absolute: 0.9 10*3/uL (ref 0.1–1.0)
Monocytes Relative: 11 % (ref 3–12)
Neutro Abs: 5 10*3/uL (ref 1.7–7.7)
Neutrophils Relative %: 65 % (ref 43–77)
Platelets: 561 10*3/uL — ABNORMAL HIGH (ref 150–400)
RBC: 3.52 MIL/uL — ABNORMAL LOW (ref 4.22–5.81)
RDW: 21.5 % — ABNORMAL HIGH (ref 11.5–15.5)
WBC: 7.8 10*3/uL (ref 4.0–10.5)

## 2013-10-28 LAB — CULTURE, BLOOD (ROUTINE X 2): Culture: NO GROWTH

## 2013-10-28 LAB — GLUCOSE, CAPILLARY
Glucose-Capillary: 127 mg/dL — ABNORMAL HIGH (ref 70–99)
Glucose-Capillary: 130 mg/dL — ABNORMAL HIGH (ref 70–99)
Glucose-Capillary: 122 mg/dL — ABNORMAL HIGH (ref 70–99)
Glucose-Capillary: 153 mg/dL — ABNORMAL HIGH (ref 70–99)
Glucose-Capillary: 126 mg/dL — ABNORMAL HIGH (ref 70–99)

## 2013-10-28 LAB — RENAL FUNCTION PANEL
ALBUMIN: 2.3 g/dL — AB (ref 3.5–5.2)
BUN: 41 mg/dL — AB (ref 6–23)
CHLORIDE: 91 meq/L — AB (ref 96–112)
CO2: 27 mEq/L (ref 19–32)
Calcium: 9.5 mg/dL (ref 8.4–10.5)
Creatinine, Ser: 5.42 mg/dL — ABNORMAL HIGH (ref 0.50–1.35)
GFR calc non Af Amer: 10 mL/min — ABNORMAL LOW (ref >90)
GFR, EST AFRICAN AMERICAN: 12 mL/min — AB (ref >90)
Glucose, Bld: 131 mg/dL — ABNORMAL HIGH (ref 70–99)
POTASSIUM: 3.8 meq/L (ref 3.7–5.3)
Phosphorus: 4.4 mg/dL (ref 2.3–4.6)
Sodium: 136 mEq/L — ABNORMAL LOW (ref 137–147)

## 2013-10-28 NOTE — Progress Notes (Signed)
[facility] PROGRESS NOTE  Oscar Castillo EXB:[id number] DOB: 10-Feb-1951 DOA: 10/01/2013 PCP: [name], MD  Assessment/Plan: Encephalopathy, metabolic;  -Suspect dehydration and possible underlying infectious processes as etiology;  patient's cognition appears to be clearing. Patient eating and answering simple questions  - MRI negative for acute stroke.  -ammonia level 10.   -peg tube in place for nutrition.   Osteomyelitis right BKA stump  -Pain is increasing since surgery in February 2015. MRI 3/13 shows pathology see results below  -3/14 MRI of right BKA stump shows abscess/osteomyelitis see results below  -3/30 MRI right BKA stump negative for osteomyelitis see results below  -3/15 seen by Dr. Jean Rosenthal (orthopedic surgery), round discuss abnormal findings of MRI right BKA stump and decide on course of action on 3/16 (debridement, stump revision?)  -Dr. Meridee Score (orthopedic surgeon) revised right BKA stump secondary abscess/osteomyelitis on 3/18  -Discussed with Dr Sharol Given no need for antibiotics.   HYPERTENSION  -Continue hydralazine 5 mg PRN SBP> 160 or DBP> 100  -continue labetalol 200 mg BID   Anemia due to chronic illness  Follow trend. Hb at 10.   DM (diabetes mellitus) type I controlled with renal manifestation  -Continue resistant SSI; CBG currently controlled on this regimen   ESRD;  -Usual dialysis days Tuesday Thursday Saturday   Chronic combined systolic (EF 10%) and grade 2 diastolic congestive heart failure  -Compensated and managed with dialysis on T/Th/Sat   HEPATITIS C, HX OF  -Liver enzymes have been stable since patient's admission   CEREBROVASCULAR ACCIDENT, HX OF  -No evidence of acute infarct  -3/26 MRI of brain noncontrast negative acute finding.    Fever ; resolved.  -Patient unable to contribute to history and given altered mentation and fever an LP was done to rule out possible meningitis-CSF studies pending-partial results  with 35 of protein and 156 of glucose so not indicative of bacterial meningitis-CSF culture no growth  -Influenza PCR negative  -Blood cultures no growth to date urinalysis unremarkable  -3/14 Right BKA stump shows abscess/osteomyelitis by MRI;; see results below  -3/18 surgeon's S/P abscess drainage/revision of right BKA stump on Wednesday 3/18   New LBBB  -has been transient in nature  -systolic function normal but septal motion with abnormal function and dyssynergy  -cardiac enzymes negative.  Nutrition  -Nephro at 45 ml/hr   Code Status: Full Code.  Family Communication: updated wife.  Disposition Plan: LTAC when bed available and hepatitis lab available. Hopefully tomorrow.    Consultants: Dr. Roney Jaffe Nephrology  Dr Meridee Score (Orthopedic Surgery)  Dr. Jean Rosenthal (orthopedic surgery)  Dr. Rhea Pink (palliative care)  Radiology for lumbar puncture   Procedures: IR placement of 20 Fr pull through G tube placed on 10/25/2013,  3/26/to 15 MRI right tibia/fibula without contrast  1. No evidence of osteomyelitis or soft tissue abscess or  cellulitis.  2. Small hematoma in the posterior aspect of the stump.  3. Small right knee effusion, unchanged since the prior exam and  only minimally more prominent than in the opposite knee.   10/19/2013 MR head without contrast  Progression of atrophy and small vessel disease. No acute  intracranial findings.  No interval change in the appearance of multiple small foci of  chronic hemorrhage.  No proximal significant flow reducing lesion is evident.   10/11/2013 revision of right BKA secondary to Osteomyelitis/Abscess  MRI tib-fib right BKA stump 10/06/2013  Findings consistent with a focal abscess at the tip of the  stumps of  the distal tibia and fibula.  Subtle edema at the tip of the stumps of the tibia and fibula most  likely represent focal osteomyelitis because the fluid collections,  which probably represent  pus, are immediately adjacent to the bones.  but there is no discrete bone destruction.  Probable adjacent myositis/cellulitis.   CT head without contrast 10/01/2013  1. There is no evidence of an acute intracranial hemorrhage nor of  an evolving ischemic infarction.  2. There is stable diffuse atrophy with evidence of chronic small  vessel ischemic change.  3. There is no intracranial mass effect.  4. There is no evidence of an acute skull fracture.  Lumbar puncture by interventional radiology   Antibiotics: Zosyn 3/14 >> stopped 3/28  Vancomycin 3/14>> stopped 3/29  Aztreonam 3/08 >>> stopped 3/14  Vancomycin 3/08 >>> stopped 3/14    HPI/Subjective: Open eyes. Answer yes and no questions.   Objective: Filed Vitals:   10/28/13 1330  BP: 144/86  Pulse: 86  Temp:   Resp:     Intake/Output Summary (Last 24 hours) at 10/28/13 1412 Last data filed at 10/27/13 2300  Gross per 24 hour  Intake    857 ml  Output      0 ml  Net    857 ml   Filed Weights   10/26/13 2035 10/27/13 2039 10/28/13 1239  Weight: 76 kg (167 lb 8.8 oz) 78 kg (171 lb 15.3 oz) 75 kg (165 lb 5.5 oz)    Exam:   General: no distress. Awake.   Cardiovascular: S 1, S 2 RRR  Respiratory: CTA  Abdomen: Bs present, soft, nt  Musculoskeletal: dressing right BKA.   Data Reviewed: Basic Metabolic Panel:  Recent Labs Lab 10/22/13 0605 10/23/13 0634 10/24/13 0826 10/25/13 0517 10/26/13 0750 10/27/13 0600 10/28/13 0600  NA 139 144 142 137 139 136* 136*  K 4.0 3.8 3.4* 3.9 3.9 4.2 3.8  CL 95* 100 98 94* 93* 93* 91*  CO2 28 27 26 25 23 24 27   GLUCOSE 169* 174* 165* 138* 94 139* 131*  BUN 17 35* 55* 34* 44* 25* 41*  CREATININE 3.55* 5.72* 7.30* 4.35* 6.43* 3.62* 5.42*  CALCIUM 9.8 10.0 10.0 9.6 9.7 9.3 9.5  MG 2.3 2.4  --  2.3 2.3 2.0  --   PHOS  --   --  4.8*  --   --   --  4.4   Liver Function Tests:  Recent Labs Lab 10/22/13 0605 10/23/13 0634 10/24/13 0826 10/25/13 0517  10/26/13 0750 10/27/13 0600 10/28/13 0600  AST 33 36  --  41* 52* 47*  --   ALT 16 16  --  21 16 11   --   ALKPHOS 61 63  --  72 72 76  --   BILITOT 0.3 0.3  --  0.4 0.4 0.3  --   PROT 8.8* 8.7*  --  8.4* 8.6* 8.4*  --   ALBUMIN 2.3* 2.3* 2.4* 2.4* 2.4* 2.5* 2.3*   No results found for this basename: LIPASE, AMYLASE,  in the last 168 hours  Recent Labs Lab 10/26/13 1300  AMMONIA 10*   CBC:  Recent Labs Lab 10/23/13 0634 10/24/13 0826 10/25/13 0517 10/26/13 0750 10/27/13 0600 10/28/13 0600  WBC 10.5 9.0 10.9* 9.9 8.6 7.8  NEUTROABS 7.0  --  7.1 6.4 6.0 5.0  HGB 10.1* 9.5* 9.9* 10.2* 10.3* 10.1*  HCT 32.6* 30.1* 31.4* 32.6* 32.4* 31.8*  MCV 90.3 89.1 89.7 89.3 88.8  90.3  PLT 568* 596* 589* 605* PLATELET CLUMPS NOTED ON SMEAR, COUNT APPEARS INCREASED 561*   Cardiac Enzymes: No results found for this basename: CKTOTAL, CKMB, CKMBINDEX, TROPONINI,  in the last 168 hours BNP (last 3 results)  Recent Labs  10/01/13 1135  PROBNP 1397.0*   CBG:  Recent Labs Lab 10/27/13 2036 10/27/13 2350 10/28/13 0408 10/28/13 0822 10/28/13 1143  GLUCAP 160* 117* 127* 130* 122*    Recent Results (from the past 240 hour(s))  CULTURE, BLOOD (ROUTINE X 2)     Status: None   Collection Time    10/22/13  1:10 PM      Result Value Ref Range Status   Specimen Description BLOOD RIGHT HAND   Final   Special Requests BOTTLES DRAWN AEROBIC ONLY 3CC   Final   Culture  Setup Time     Final   Value: 10/22/2013 21:20     Performed at [facility]   Culture     Final   Value: NO GROWTH 5 DAYS     Performed at [facility]   Report Status 10/28/2013 FINAL   Final  CULTURE, BLOOD (ROUTINE X 2)     Status: None   Collection Time    10/22/13  9:55 PM      Result Value Ref Range Status   Specimen Description BLOOD RIGHT HAND   Final   Special Requests BOTTLES DRAWN AEROBIC ONLY 6CC   Final   Culture  Setup Time     Final   Value: 10/23/2013 01:36     Performed at  [facility]   Culture     Final   Value:        BLOOD CULTURE RECEIVED NO GROWTH TO DATE CULTURE WILL BE HELD FOR 5 DAYS BEFORE ISSUING A FINAL NEGATIVE REPORT     Performed at [facility]   Report Status PENDING   Incomplete     Studies: No results found.  Scheduled Meds: . bisacodyl  5 mg Oral BID  .  ceFAZolin (ANCEF) IV  2 g Intravenous Once  . darbepoetin (ARANESP) injection - DIALYSIS  25 mcg Intravenous Q Tue-HD  . feeding supplement (PRO-STAT SUGAR FREE 64)  30 mL Per Tube Q1200  . FLUoxetine  20 mg Per Tube Daily  . free water  175 mL Per Tube QID  . heparin subcutaneous  5,000 Units Subcutaneous 3 times per day  . insulin aspart  0-20 Units Subcutaneous 6 times per day  . labetalol  200 mg Oral BID  . multivitamin  1 tablet Oral QHS  . neomycin-bacitracin-polymyxin   Topical TID  . ondansetron (ZOFRAN) IV  4 mg Intravenous 3 times per day  . sodium chloride  3 mL Intravenous Q12H   Continuous Infusions: . feeding supplement (NEPRO CARB STEADY) 1,000 mL (10/27/13 0223)    Principal Problem:   Encephalopathy, metabolic Active Problems:   HYPERTENSION   HEPATITIS C, HX OF   CEREBROVASCULAR ACCIDENT, HX OF   BENIGN PROSTATIC HYPERTROPHY   Hyperlipidemia   Anemia due to chronic illness   DM (diabetes mellitus) type I controlled with renal manifestation   Chronic combined systolic (EF [id number]) and grade 2diastolic congestive heart failure   FTT (failure to thrive) in adult   Dehydration   Fever    Time spent: 25 minutes.     Niel Hummer A  [facility] Pager [phone]. If 7PM-7AM, please contact night-coverage at [email or web address], password [email or web address] 10/28/2013, 2:12 PM  LOS: 27 days

## 2013-10-28 NOTE — Progress Notes (Signed)
I have personally seen and examined this patient and agree with the assessment/plan as outlined above by [facility] PA. Florice Hindle K.,MD 10/28/2013 10:24 AM

## 2013-10-28 NOTE — Procedures (Signed)
Patient seen on Hemodialysis. QB 350, UF goal 1.5L Treatment adjusted as needed.  Oscar Shiley MD [facility]. Office # [phone] Pager # [phone] 2:53 PM

## 2013-10-28 NOTE — Progress Notes (Signed)
[facility] Progress Note  Assessment/Plan: 1. AMS - Some improvement.  The etiology appears multifactorial (depression/ hx cva/metabolic encephalopathy) Imaging of brain - no acute findings - see #9 2. Bleeding s/p R BKA revision 3/18 - MRI Rt tib/fib on 3/26 negative for abscess, osteo or cellulitis, but small hematoma and effusion present. Mgmt per ortho - ok for discharge 3. ESRD - TTS -for HD today 4. Anemia - Hgb 10.1 stable Continue Aranesp 25 Q Tuesday. Last Tsat 40 on 2/28. No Fe for now.  5. HTN/ volume - on labetalol BID BP variable - UF 967 cc Tuesday; 1 L Thursday - looks euvolemic to me; no dependent edema 6. MBD - Hypercalcemia worsening. Corr Ca 10.9 If from TF should be lower since hasn't had any over 24 hours; alb low; Nepro has 250 mg Ca per 8 oz Continue holding Vit D. Low phos off binders. Needs 4 K bath - so will have 2.25 Ca bath 7. Malnutrition - Nepro at 45/hr + flushes - supposed to start feedings today per nursing 8. Hep C + LFT ok  9. FTT/debiliatated/from SNF - Palliative care met with pt's wife on 3/22, full code per family; I believe this needs to be revisited; his QOL is very poor. Unless is mental status becomes clearer, I do not think he is appropriate for outpt dialysis due to safety issues- He needs LTAC if he is to continue dialysis 10. Depression - seen by psych - liquid prozac started 3/23 - hard to assess improvement, but he is talking more and in full sentences  Myriam Jacobson, PA-C [place] [phone] 10/28/2013,9:06 AM  LOS: 27 days   Subjective:   "I don't feel good". C/o of pain but cannot tell me where the pain is. Wife states no diarrhea with TF. Not taking any po foods. Wife doesn't think he is in pain, because he is not moaning and is not restless.  Objective Filed Vitals:   10/27/13 0806 10/27/13 1111 10/27/13 2039 10/28/13 0410  BP: 115/73 123/74 113/77 103/67  Pulse: 85 86 84 79  Temp: 98 F (36.7  C) 97.8 F (36.6 C) 99.3 F (37.4 C) 99.5 F (37.5 C)  TempSrc: Oral Oral Oral Oral  Resp: '16 18 17 16  ' Height:      Weight:   78 kg (171 lb 15.3 oz)   SpO2: 100% 100% 100% 100%   Physical Exam General: eyes closed, will open upon request, calm Heart: RRR Lungs: clear without wheezes Abdomen: soft NT; GT in tact Extremities: no LE edema Dialysis Access: left upper AVF + bruit  Dialysis Orders: [facility]  4:15 83 kg 500/A1.5 2K/2Ca Heparin 5000 U, intermittent dose of 2400 U AVF @ LFA  Hectorol 4 mcg Epogen 0 Venofer 100 mg x 10 (through 2/24)   Additional Objective Labs: Basic Metabolic Panel:  Recent Labs Lab 10/24/13 0826  10/26/13 0750 10/27/13 0600 10/28/13 0600  NA 142  < > 139 136* 136*  K 3.4*  < > 3.9 4.2 3.8  CL 98  < > 93* 93* 91*  CO2 26  < > '23 24 27  ' GLUCOSE 165*  < > 94 139* 131*  BUN 55*  < > 44* 25* 41*  CREATININE 7.30*  < > 6.43* 3.62* 5.42*  CALCIUM 10.0  < > 9.7 9.3 9.5  PHOS 4.8*  --   --   --  4.4  < > = values in this interval not  displayed. Liver Function Tests:  Recent Labs Lab 10/25/13 0517 10/26/13 0750 10/27/13 0600 10/28/13 0600  AST 41* 52* 47*  --   ALT '21 16 11  ' --   ALKPHOS 72 72 76  --   BILITOT 0.4 0.4 0.3  --   PROT 8.4* 8.6* 8.4*  --   ALBUMIN 2.4* 2.4* 2.5* 2.3*   CBC:  Recent Labs Lab 10/24/13 0826 10/25/13 0517 10/26/13 0750 10/27/13 0600 10/28/13 0600  WBC 9.0 10.9* 9.9 8.6 7.8  NEUTROABS  --  7.1 6.4 6.0 5.0  HGB 9.5* 9.9* 10.2* 10.3* 10.1*  HCT 30.1* 31.4* 32.6* 32.4* 31.8*  MCV 89.1 89.7 89.3 88.8 90.3  PLT 596* 589* 605* PLATELET CLUMPS NOTED ON SMEAR, COUNT APPEARS INCREASED 561*   Blood Culture    Component Value Date/Time   SDES BLOOD RIGHT HAND 10/22/2013 2155   SPECREQUEST BOTTLES DRAWN AEROBIC ONLY [facility] 10/22/2013 2155   CULT  Value:        BLOOD CULTURE RECEIVED NO GROWTH TO DATE CULTURE WILL BE HELD FOR 5 DAYS BEFORE ISSUING A FINAL NEGATIVE REPORT Performed at [facility]  10/22/2013 2155   REPTSTATUS PENDING 10/22/2013 2155    Cardiac Enzymes: No results found for this basename: CKTOTAL, CKMB, CKMBINDEX, TROPONINI,  in the last 168 hours CBG:  Recent Labs Lab 10/27/13 1658 10/27/13 2036 10/27/13 2350 10/28/13 0408 10/28/13 0822  GLUCAP 196* 160* 117* 127* 130*   Iron Studies: No results found for this basename: IRON, TIBC, TRANSFERRIN, FERRITIN,  in the last 72 hours '@lablastinr3' @ Studies/Results: No results found. Medications: . feeding supplement (NEPRO CARB STEADY) 1,000 mL (10/27/13 0223)   . bisacodyl  5 mg Oral BID  .  ceFAZolin (ANCEF) IV  2 g Intravenous Once  . darbepoetin (ARANESP) injection - DIALYSIS  25 mcg Intravenous Q Tue-HD  . feeding supplement (PRO-STAT SUGAR FREE 64)  30 mL Per Tube Q1200  . FLUoxetine  20 mg Per Tube Daily  . free water  175 mL Per Tube QID  . heparin subcutaneous  5,000 Units Subcutaneous 3 times per day  . insulin aspart  0-20 Units Subcutaneous 6 times per day  . labetalol  200 mg Oral BID  . multivitamin  1 tablet Oral QHS  . neomycin-bacitracin-polymyxin   Topical TID  . ondansetron (ZOFRAN) IV  4 mg Intravenous 3 times per day  . sodium chloride  3 mL Intravenous Q12H

## 2013-10-29 ENCOUNTER — Other Ambulatory Visit: Payer: Self-pay | Admitting: Internal Medicine

## 2013-10-29 LAB — CULTURE, BLOOD (ROUTINE X 2): CULTURE: NO GROWTH

## 2013-10-29 LAB — CBC WITH DIFFERENTIAL/PLATELET
BASOS PCT: 0 % (ref 0–1)
Basophils Absolute: 0 10*3/uL (ref 0.0–0.1)
EOS ABS: 0.2 10*3/uL (ref 0.0–0.7)
EOS PCT: 2 % (ref 0–5)
HEMATOCRIT: 33.9 % — AB (ref 39.0–52.0)
HEMOGLOBIN: 10.7 g/dL — AB (ref 13.0–17.0)
Lymphocytes Relative: 20 % (ref 12–46)
Lymphs Abs: 2 10*3/uL (ref 0.7–4.0)
MCH: 28.5 pg (ref 26.0–34.0)
MCHC: 31.6 g/dL (ref 30.0–36.0)
MCV: 90.4 fL (ref 78.0–100.0)
MONO ABS: 0.7 10*3/uL (ref 0.1–1.0)
MONOS PCT: 7 % (ref 3–12)
NEUTROS PCT: 71 % (ref 43–77)
Neutro Abs: 7.1 10*3/uL (ref 1.7–7.7)
Platelets: 533 10*3/uL — ABNORMAL HIGH (ref 150–400)
RBC: 3.75 MIL/uL — ABNORMAL LOW (ref 4.22–5.81)
RDW: 21.6 % — ABNORMAL HIGH (ref 11.5–15.5)
WBC: 10 10*3/uL (ref 4.0–10.5)

## 2013-10-29 LAB — GLUCOSE, CAPILLARY
GLUCOSE-CAPILLARY: 129 mg/dL — AB (ref 70–99)
GLUCOSE-CAPILLARY: 155 mg/dL — AB (ref 70–99)
Glucose-Capillary: 101 mg/dL — ABNORMAL HIGH (ref 70–99)
Glucose-Capillary: 110 mg/dL — ABNORMAL HIGH (ref 70–99)
Glucose-Capillary: 120 mg/dL — ABNORMAL HIGH (ref 70–99)
Glucose-Capillary: 143 mg/dL — ABNORMAL HIGH (ref 70–99)

## 2013-10-29 NOTE — Progress Notes (Signed)
I have personally seen and examined this patient and agree with the assessment/plan as outlined above by [facility] PA. Looking at possible discharge to [place] tomorrow to get him to a Monday/Wednesday/Friday schedule Zena Vitelli K.,MD 10/29/2013 10:49 AM

## 2013-10-29 NOTE — Progress Notes (Signed)
[facility] Progress Note  Assessment/Plan: 1. AMS - Some improvement. The etiology appears multifactorial (depression/ hx cva/metabolic encephalopathy) Imaging of brain - no acute findings - see #9 2. Bleeding s/p R BKA revision 3/18 - MRI Rt tib/fib on 3/26 negative for abscess, osteo or cellulitis, but small hematoma and effusion present. Mgmt per ortho - ok for discharge 3. ESRD - TTS -next HD; [facility] dialyzes on a MWF schedule therefore will schedule his next HD for Monday in am in case he is able to be d/c 4. Anemia - Hgb 10.7 stable Continue Aranesp 25 Q Tuesday. Last Tsat 40 on 2/28. No Fe for now.  5. HTN/ volume - on labetalol BID BP variable - average UF q HD 1 L - yesterdays weight markedly lower doesn't make sense - likely error;  6. MBD - Hypercalcemia worsening. Corr Ca 10.9 If from TF should be lower since hasn't had any over 24 hours; alb low; Nepro has 250 mg Ca per 8 oz Continue holding Vit D. Low phos off binders. Needs 4 K bath - so will have 2.25 Ca bath 7. Malnutrition - Nepro at 45/hr per Gtube+ flushes; no po intake 8. Hep C + LFT ok  9. FTT/debiliatated/from SNF - Palliative care met with pt's wife on 3/22, full code per family; I believe this needs to be revisited; his QOL is very poor. Unless is mental status becomes clearer, I do not think he is appropriate for outpt dialysis due to safety issues- planning d/c to [facility] 10. Depression - seen by psych - liquid prozac started 3/23 - hard to assess improvement, but he is talking more and in full sentences  Myriam Jacobson, PA-C [place] [phone] 10/29/2013,8:11 AM  LOS: 28 days   Subjective:   No complaints today; wife says the plan is to transfer to [facility] when bed available  Objective Filed Vitals:   10/28/13 1752 10/28/13 2003 10/29/13 0335 10/29/13 0415  BP: 112/73 119/71  129/88  Pulse: 91 92  84  Temp: 97.6 F (36.4 C) 98.3 F (36.8 C)  99.2 F (37.3 C)   TempSrc: Oral Oral  Oral  Resp: _0 Height:      Weight:  69 kg (152 lb 1.9 oz) 69 kg (152 lb 1.9 oz)   SpO2: 100% 100%  100%   Physical Exam General: alert speech clear Heart: RRR Lungs: no wheezes or rales Abdomen: soft nt, stooling in bed Extremities: no edema; left bka wrapped; right dry ulcerated area above knee and several scabbed areas on stump Dialysis Access: left upper AVF patent  Dialysis Orders: T[facility]  4:15 83 kg 500/A1.5 2K/2Ca Heparin 5000 U, intermittent dose of 2400 U AVF @ LFA  Hectorol 4 mcg Epogen 0 Venofer 100 mg x 10 (through 2/24)   Additional Objective Labs: Basic Metabolic Panel:  Recent Labs Lab 10/24/13 0826  10/26/13 0750 10/27/13 0600 10/28/13 0600  NA 142  < > 139 136* 136*  K 3.4*  < > 3.9 4.2 3.8  CL 98  < > 93* 93* 91*  CO2 26  < > _1 GLUCOSE 165*  < > 94 139* 131*  BUN 55*  < > 44* 25* 41*  CREATININE 7.30*  < > 6.43* 3.62* 5.42*  CALCIUM 10.0  < > 9.7 9.3 9.5  PHOS 4.8*  --   --   --  4.4  < > = values in this interval  not displayed. Liver Function Tests:  Recent Labs Lab 10/25/13 0517 10/26/13 0750 10/27/13 0600 10/28/13 0600  AST 41* 52* 47*  --   ALT _0 --   ALKPHOS 72 72 76  --   BILITOT 0.4 0.4 0.3  --   PROT 8.4* 8.6* 8.4*  --   ALBUMIN 2.4* 2.4* 2.5* 2.3*  CBC:  Recent Labs Lab 10/25/13 0517 10/26/13 0750 10/27/13 0600 10/28/13 0600 10/29/13 0605  WBC 10.9* 9.9 8.6 7.8 10.0  NEUTROABS 7.1 6.4 6.0 5.0 7.1  HGB 9.9* 10.2* 10.3* 10.1* 10.7*  HCT 31.4* 32.6* 32.4* 31.8* 33.9*  MCV 89.7 89.3 88.8 90.3 90.4  PLT 589* 605* PLATELET CLUMPS NOTED ON SMEAR, COUNT APPEARS INCREASED 561* 533*   Blood Culture    Component Value Date/Time   SDES BLOOD RIGHT HAND 10/22/2013 2155   SPECREQUEST BOTTLES DRAWN AEROBIC ONLY [facility] 10/22/2013 2155   CULT  Value:        BLOOD CULTURE RECEIVED NO GROWTH TO DATE CULTURE WILL BE HELD FOR 5 DAYS BEFORE ISSUING A FINAL NEGATIVE REPORT Performed at [place] 10/22/2013 2155   REPTSTATUS PENDING 10/22/2013 2155    CBG:  Recent Labs Lab 10/28/13 0822 10/28/13 1143 10/28/13 1749 10/28/13 1959 10/28/13 2358  GLUCAP 130* 122* 153* 126* 110*  Medications: . feeding supplement (NEPRO CARB STEADY) 1,000 mL (10/27/13 0223)   . bisacodyl  5 mg Oral BID  .  ceFAZolin (ANCEF) IV  2 g Intravenous Once  . darbepoetin (ARANESP) injection - DIALYSIS  25 mcg Intravenous Q Tue-HD  . feeding supplement (PRO-STAT SUGAR FREE 64)  30 mL Per Tube Q1200  . FLUoxetine  20 mg Per Tube Daily  . free water  175 mL Per Tube QID  . heparin subcutaneous  5,000 Units Subcutaneous 3 times per day  . insulin aspart  0-20 Units Subcutaneous 6 times per day  . labetalol  200 mg Oral BID  . multivitamin  1 tablet Oral QHS  . neomycin-bacitracin-polymyxin   Topical TID  . ondansetron (ZOFRAN) IV  4 mg Intravenous 3 times per day  . sodium chloride  3 mL Intravenous Q12H

## 2013-10-29 NOTE — Progress Notes (Signed)
[facility] PROGRESS NOTE  HEVER CASTILLEJA CHE:[id number] DOB: 1950/10/06 DOA: 10/01/2013 PCP: [name], MD  Assessment/Plan: Encephalopathy, metabolic;  -Suspect dehydration and possible underlying infectious processes as etiology,;  Patient answering simple questions  - MRI negative for acute stroke.  -ammonia level 10.   -peg tube in place for nutrition.   Osteomyelitis right BKA stump  -Pain is increasing since surgery in February 2015. MRI 3/13 shows pathology see results below  -3/14 MRI of right BKA stump shows abscess/osteomyelitis see results below  -3/30 MRI right BKA stump negative for osteomyelitis see results below  -3/15 seen by Dr. Jean Rosenthal (orthopedic surgery), round discuss abnormal findings of MRI right BKA stump and decide on course of action on 3/16 (debridement, stump revision?)  -Dr. Meridee Score (orthopedic surgeon) revised right BKA stump secondary abscess/osteomyelitis on 3/18  -Discussed with Dr Sharol Given no need for antibiotics.   HYPERTENSION  -Continue hydralazine 5 mg PRN SBP> 160 or DBP> 100  -continue labetalol 200 mg BID   Anemia due to chronic illness  Follow trend. Hb at 10.   DM (diabetes mellitus) type I controlled with renal manifestation  -Continue resistant SSI; CBG currently controlled on this regimen   ESRD;  -Usual dialysis days Tuesday Thursday Saturday   Chronic combined systolic (EF 53%) and grade 2 diastolic congestive heart failure  -Compensated and managed with dialysis on T/Th/Sat   HEPATITIS C, HX OF  -Liver enzymes have been stable since patient's admission   CEREBROVASCULAR ACCIDENT, HX OF  -No evidence of acute infarct  -3/26 MRI of brain noncontrast negative acute finding.    Fever ; resolved.  -Patient unable to contribute to history and given altered mentation and fever an LP was done to rule out possible meningitis-CSF studies pending-partial results with 35 of protein and 156 of glucose so not indicative  of bacterial meningitis-CSF culture no growth  -Influenza PCR negative  -Blood cultures no growth to date urinalysis unremarkable  -3/14 Right BKA stump shows abscess/osteomyelitis by MRI;; see results below  -3/18 surgeon's S/P abscess drainage/revision of right BKA stump on Wednesday 3/18   New LBBB  -has been transient in nature  -systolic function normal but septal motion with abnormal function and dyssynergy  -cardiac enzymes negative.  Nutrition  -Nephro at 45 ml/hr   Code Status: Full Code.  Family Communication: updated wife.  Disposition Plan: LTAC when bed available and hepatitis lab available. Hopefully tomorrow.    Consultants: Dr. Roney Jaffe Nephrology  Dr Meridee Score (Orthopedic Surgery)  Dr. Jean Rosenthal (orthopedic surgery)  Dr. Rhea Pink (palliative care)  Radiology for lumbar puncture   Procedures: IR placement of 20 Fr pull through G tube placed on 10/25/2013,  3/26/to 15 MRI right tibia/fibula without contrast  1. No evidence of osteomyelitis or soft tissue abscess or  cellulitis.  2. Small hematoma in the posterior aspect of the stump.  3. Small right knee effusion, unchanged since the prior exam and  only minimally more prominent than in the opposite knee.   10/19/2013 MR head without contrast  Progression of atrophy and small vessel disease. No acute  intracranial findings.  No interval change in the appearance of multiple small foci of  chronic hemorrhage.  No proximal significant flow reducing lesion is evident.   10/11/2013 revision of right BKA secondary to Osteomyelitis/Abscess  MRI tib-fib right BKA stump 10/06/2013  Findings consistent with a focal abscess at the tip of the stumps of  the distal tibia and fibula.  Subtle edema at the tip of the stumps of the tibia and fibula most  likely represent focal osteomyelitis because the fluid collections,  which probably represent pus, are immediately adjacent to the bones.  but there  is no discrete bone destruction.  Probable adjacent myositis/cellulitis.   CT head without contrast 10/01/2013  1. There is no evidence of an acute intracranial hemorrhage nor of  an evolving ischemic infarction.  2. There is stable diffuse atrophy with evidence of chronic small  vessel ischemic change.  3. There is no intracranial mass effect.  4. There is no evidence of an acute skull fracture.  Lumbar puncture by interventional radiology   Antibiotics: Zosyn 3/14 >> stopped 3/28  Vancomycin 3/14>> stopped 3/29  Aztreonam 3/08 >>> stopped 3/14  Vancomycin 3/08 >>> stopped 3/14    HPI/Subjective: Open eyes. Answer yes and no questions.   Objective: Filed Vitals:   10/29/13 1335  BP: 107/63  Pulse: 76  Temp: 97.9 F (36.6 C)  Resp: 16    Intake/Output Summary (Last 24 hours) at 10/29/13 1519 Last data filed at 10/29/13 0300  Gross per 24 hour  Intake 1675.75 ml  Output   1045 ml  Net 630.75 ml   Filed Weights   10/28/13 1709 10/28/13 2003 10/29/13 0335  Weight: 68 kg (149 lb 14.6 oz) 69 kg (152 lb 1.9 oz) 69 kg (152 lb 1.9 oz)    Exam:   General: no distress. Awake.   Cardiovascular: S 1, S 2 RRR  Respiratory: CTA  Abdomen: Bs present, soft, nt  Musculoskeletal: dressing right BKA.   Data Reviewed: Basic Metabolic Panel:  Recent Labs Lab 10/23/13 0634 10/24/13 0826 10/25/13 0517 10/26/13 0750 10/27/13 0600 10/28/13 0600  NA 144 142 137 139 136* 136*  K 3.8 3.4* 3.9 3.9 4.2 3.8  CL 100 98 94* 93* 93* 91*  CO2 27 26 25 23 24 27   GLUCOSE 174* 165* 138* 94 139* 131*  BUN 35* 55* 34* 44* 25* 41*  CREATININE 5.72* 7.30* 4.35* 6.43* 3.62* 5.42*  CALCIUM 10.0 10.0 9.6 9.7 9.3 9.5  MG 2.4  --  2.3 2.3 2.0  --   PHOS  --  4.8*  --   --   --  4.4   Liver Function Tests:  Recent Labs Lab 10/23/13 0634 10/24/13 0826 10/25/13 0517 10/26/13 0750 10/27/13 0600 10/28/13 0600  AST 36  --  41* 52* 47*  --   ALT 16  --  21 16 11   --   ALKPHOS 63   --  72 72 76  --   BILITOT 0.3  --  0.4 0.4 0.3  --   PROT 8.7*  --  8.4* 8.6* 8.4*  --   ALBUMIN 2.3* 2.4* 2.4* 2.4* 2.5* 2.3*   No results found for this basename: LIPASE, AMYLASE,  in the last 168 hours  Recent Labs Lab 10/26/13 1300  AMMONIA 10*   CBC:  Recent Labs Lab 10/25/13 0517 10/26/13 0750 10/27/13 0600 10/28/13 0600 10/29/13 0605  WBC 10.9* 9.9 8.6 7.8 10.0  NEUTROABS 7.1 6.4 6.0 5.0 7.1  HGB 9.9* 10.2* 10.3* 10.1* 10.7*  HCT 31.4* 32.6* 32.4* 31.8* 33.9*  MCV 89.7 89.3 88.8 90.3 90.4  PLT 589* 605* PLATELET CLUMPS NOTED ON SMEAR, COUNT APPEARS INCREASED 561* 533*   Cardiac Enzymes: No results found for this basename: CKTOTAL, CKMB, CKMBINDEX, TROPONINI,  in the last 168 hours BNP (last 3 results)  Recent Labs  10/01/13 1135  PROBNP 1397.0*   CBG:  Recent Labs Lab 10/28/13 1959 10/28/13 2358 10/29/13 0415 10/29/13 0841 10/29/13 1322  GLUCAP 126* 110* 120* 129* 155*    Recent Results (from the past 240 hour(s))  CULTURE, BLOOD (ROUTINE X 2)     Status: None   Collection Time    10/22/13  1:10 PM      Result Value Ref Range Status   Specimen Description BLOOD RIGHT HAND   Final   Special Requests BOTTLES DRAWN AEROBIC ONLY 3CC   Final   Culture  Setup Time     Final   Value: 10/22/2013 21:20     Performed at [facility]   Culture     Final   Value: NO GROWTH 5 DAYS     Performed at [facility]   Report Status 10/28/2013 FINAL   Final  CULTURE, BLOOD (ROUTINE X 2)     Status: None   Collection Time    10/22/13  9:55 PM      Result Value Ref Range Status   Specimen Description BLOOD RIGHT HAND   Final   Special Requests BOTTLES DRAWN AEROBIC ONLY [facility]   Final   Culture  Setup Time     Final   Value: 10/23/2013 01:36     Performed at [facility]   Culture     Final   Value: NO GROWTH 5 DAYS     Performed at [facility]   Report Status 10/29/2013 FINAL   Final     Studies: No results  found.  Scheduled Meds: . bisacodyl  5 mg Oral BID  .  ceFAZolin (ANCEF) IV  2 g Intravenous Once  . darbepoetin (ARANESP) injection - DIALYSIS  25 mcg Intravenous Q Tue-HD  . feeding supplement (PRO-STAT SUGAR FREE 64)  30 mL Per Tube Q1200  . FLUoxetine  20 mg Per Tube Daily  . free water  175 mL Per Tube QID  . heparin subcutaneous  5,000 Units Subcutaneous 3 times per day  . insulin aspart  0-20 Units Subcutaneous 6 times per day  . labetalol  200 mg Oral BID  . multivitamin  1 tablet Oral QHS  . neomycin-bacitracin-polymyxin   Topical TID  . ondansetron (ZOFRAN) IV  4 mg Intravenous 3 times per day  . sodium chloride  3 mL Intravenous Q12H   Continuous Infusions: . feeding supplement (NEPRO CARB STEADY) 1,000 mL (10/27/13 0223)    Principal Problem:   Encephalopathy, metabolic Active Problems:   HYPERTENSION   HEPATITIS C, HX OF   CEREBROVASCULAR ACCIDENT, HX OF   BENIGN PROSTATIC HYPERTROPHY   Hyperlipidemia   Anemia due to chronic illness   DM (diabetes mellitus) type I controlled with renal manifestation   Chronic combined systolic (EF 26%) and grade 2diastolic congestive heart failure   FTT (failure to thrive) in adult   Dehydration   Fever    Time spent: 25 minutes.     Niel Hummer A  [facility] Pager [phone]. If 7PM-7AM, please contact night-coverage at [email or web address], password [facility] 10/29/2013, 3:19 PM  LOS: 28 days

## 2013-10-30 LAB — GLUCOSE, CAPILLARY
GLUCOSE-CAPILLARY: 151 mg/dL — AB (ref 70–99)
GLUCOSE-CAPILLARY: 168 mg/dL — AB (ref 70–99)
Glucose-Capillary: 129 mg/dL — ABNORMAL HIGH (ref 70–99)
Glucose-Capillary: 121 mg/dL — ABNORMAL HIGH (ref 70–99)
Glucose-Capillary: 156 mg/dL — ABNORMAL HIGH (ref 70–99)
Glucose-Capillary: 118 mg/dL — ABNORMAL HIGH (ref 70–99)

## 2013-10-30 LAB — CBC
HCT: 30.7 % — ABNORMAL LOW (ref 39.0–52.0)
Hemoglobin: 9.8 g/dL — ABNORMAL LOW (ref 13.0–17.0)
MCH: 28.4 pg (ref 26.0–34.0)
MCHC: 31.9 g/dL (ref 30.0–36.0)
MCV: 89 fL (ref 78.0–100.0)
Platelets: 469 10*3/uL — ABNORMAL HIGH (ref 150–400)
RBC: 3.45 MIL/uL — ABNORMAL LOW (ref 4.22–5.81)
RDW: 21.2 % — ABNORMAL HIGH (ref 11.5–15.5)
WBC: 9.1 10*3/uL (ref 4.0–10.5)

## 2013-10-30 LAB — RENAL FUNCTION PANEL
Albumin: 2.4 g/dL — ABNORMAL LOW (ref 3.5–5.2)
BUN: 41 mg/dL — ABNORMAL HIGH (ref 6–23)
CO2: 25 mEq/L (ref 19–32)
Calcium: 9.5 mg/dL (ref 8.4–10.5)
Chloride: 92 mEq/L — ABNORMAL LOW (ref 96–112)
Creatinine, Ser: 5.17 mg/dL — ABNORMAL HIGH (ref 0.50–1.35)
GFR calc Af Amer: 13 mL/min — ABNORMAL LOW (ref >90)
GFR calc non Af Amer: 11 mL/min — ABNORMAL LOW (ref >90)
Glucose, Bld: 143 mg/dL — ABNORMAL HIGH (ref 70–99)
Phosphorus: 4.2 mg/dL (ref 2.3–4.6)
Potassium: 3.7 mEq/L (ref 3.7–5.3)
Sodium: 136 mEq/L — ABNORMAL LOW (ref 137–147)

## 2013-10-30 NOTE — Procedures (Signed)
I was present at this dialysis session, have reviewed the session itself and made  appropriate changes  Kelly Splinter MD (pgr) [phone]    ([phone] 10/30/2013, 10:29 AM

## 2013-10-30 NOTE — Progress Notes (Signed)
Call to [facility] [phone] called to advise results received for Hep B, asked status of HD beds. She requested patient information FAX to [phone].  NCM to follow up on 10/31/2013.

## 2013-10-30 NOTE — Progress Notes (Signed)
[facility] PROGRESS NOTE  Oscar Castillo:[id number] DOB: [birth year] DOA: 10/01/2013 PCP: [name], MD  Assessment/Plan: Encephalopathy, metabolic;  -Suspect dehydration and possible underlying infectious processes as etiology, metabolic,;  Patient answering simple questions. ( yes and no answer).  - MRI negative for acute stroke.  -ammonia level 10.   -peg tube in place for nutrition.   Osteomyelitis right BKA stump  -Pain is increasing since surgery in February 2015. MRI 3/13 shows pathology see results below  -3/14 MRI of right BKA stump shows abscess/osteomyelitis see results below  -3/30 MRI right BKA stump negative for osteomyelitis see results below  -3/15 seen by Dr. Jean Rosenthal (orthopedic surgery), round discuss abnormal findings of MRI right BKA stump and decide on course of action on 3/16 (debridement, stump revision?)  -Dr. Meridee Score (orthopedic surgeon) revised right BKA stump secondary abscess/osteomyelitis on 3/18  -Discussed with Dr Sharol Given no need for antibiotics.   HYPERTENSION  -Continue hydralazine 5 mg PRN SBP> 160 or DBP> 100  -continue labetalol 200 mg BID   Anemia due to chronic illness  Follow trend. Hb at 9.8  DM (diabetes mellitus) type I controlled with renal manifestation  -Continue resistant SSI; CBG currently controlled on this regimen   ESRD;  -Usual dialysis days Tuesday Thursday Saturday   Chronic combined systolic (EF 23%) and grade 2 diastolic congestive heart failure  -Compensated and managed with dialysis on T/Th/Sat   HEPATITIS C, HX OF  -Liver enzymes have been stable since patient's admission   CEREBROVASCULAR ACCIDENT, HX OF  -No evidence of acute infarct  -3/26 MRI of brain noncontrast negative acute finding.    Fever ; resolved.  -Patient unable to contribute to history and given altered mentation and fever an LP was done to rule out possible meningitis-CSF studies pending-partial results with 35 of protein and  156 of glucose so not indicative of bacterial meningitis-CSF culture no growth  -Influenza PCR negative  -Blood cultures no growth to date urinalysis unremarkable  -3/14 Right BKA stump shows abscess/osteomyelitis by MRI;; see results below  -3/18 surgeon's S/P abscess drainage/revision of right BKA stump on Wednesday 3/18   New LBBB  -has been transient in nature  -systolic function normal but septal motion with abnormal function and dyssynergy  -cardiac enzymes negative.  Nutrition  -Nephro at 45 ml/hr   Code Status: Full Code.  Family Communication: updated wife.  Disposition Plan: LTAC when bed available and hepatitis lab available.    Consultants: Dr. Roney Jaffe Nephrology  Dr Meridee Score (Orthopedic Surgery)  Dr. Jean Rosenthal (orthopedic surgery)  Dr. Rhea Pink (palliative care)  Radiology for lumbar puncture   Procedures: IR placement of 20 Fr pull through G tube placed on 10/25/2013,  3/26/to 15 MRI right tibia/fibula without contrast  1. No evidence of osteomyelitis or soft tissue abscess or  cellulitis.  2. Small hematoma in the posterior aspect of the stump.  3. Small right knee effusion, unchanged since the prior exam and  only minimally more prominent than in the opposite knee.   10/19/2013 MR head without contrast  Progression of atrophy and small vessel disease. No acute  intracranial findings.  No interval change in the appearance of multiple small foci of  chronic hemorrhage.  No proximal significant flow reducing lesion is evident.   10/11/2013 revision of right BKA secondary to Osteomyelitis/Abscess  MRI tib-fib right BKA stump 10/06/2013  Findings consistent with a focal abscess at the tip of the stumps of  the distal  tibia and fibula.  Subtle edema at the tip of the stumps of the tibia and fibula most  likely represent focal osteomyelitis because the fluid collections,  which probably represent pus, are immediately adjacent to the  bones.  but there is no discrete bone destruction.  Probable adjacent myositis/cellulitis.   CT head without contrast 10/01/2013  1. There is no evidence of an acute intracranial hemorrhage nor of  an evolving ischemic infarction.  2. There is stable diffuse atrophy with evidence of chronic small  vessel ischemic change.  3. There is no intracranial mass effect.  4. There is no evidence of an acute skull fracture.  Lumbar puncture by interventional radiology   Antibiotics: Zosyn 3/14 >> stopped 3/28  Vancomycin 3/14>> stopped 3/29  Aztreonam 3/08 >>> stopped 3/14  Vancomycin 3/08 >>> stopped 3/14    HPI/Subjective: Open eyes. Answer yes and no to questions.   Objective: Filed Vitals:   10/30/13 1340  BP: 132/86  Pulse: 87  Temp: 97.1 F (36.2 C)  Resp: 18    Intake/Output Summary (Last 24 hours) at 10/30/13 1354 Last data filed at 10/30/13 1340  Gross per 24 hour  Intake   1475 ml  Output   1000 ml  Net    475 ml   Filed Weights   10/30/13 0156 10/30/13 0836 10/30/13 1340  Weight: 72.3 kg (159 lb 6.3 oz) 77 kg (169 lb 12.1 oz) 76 kg (167 lb 8.8 oz)    Exam:   General: no distress. Awake.   Cardiovascular: S 1, S 2 RRR  Respiratory: CTA  Abdomen: Bs present, soft, nt  Musculoskeletal: dressing right BKA.   Data Reviewed: Basic Metabolic Panel:  Recent Labs Lab 10/24/13 0826 10/25/13 0517 10/26/13 0750 10/27/13 0600 10/28/13 0600 10/30/13 0915  NA 142 137 139 136* 136* 136*  K 3.4* 3.9 3.9 4.2 3.8 3.7  CL 98 94* 93* 93* 91* 92*  CO2 26 25 23 24 27 25   GLUCOSE 165* 138* 94 139* 131* 143*  BUN 55* 34* 44* 25* 41* 41*  CREATININE 7.30* 4.35* 6.43* 3.62* 5.42* 5.17*  CALCIUM 10.0 9.6 9.7 9.3 9.5 9.5  MG  --  2.3 2.3 2.0  --   --   PHOS 4.8*  --   --   --  4.4 4.2   Liver Function Tests:  Recent Labs Lab 10/25/13 0517 10/26/13 0750 10/27/13 0600 10/28/13 0600 10/30/13 0915  AST 41* 52* 47*  --   --   ALT 21 16 11   --   --   ALKPHOS 72  72 76  --   --   BILITOT 0.4 0.4 0.3  --   --   PROT 8.4* 8.6* 8.4*  --   --   ALBUMIN 2.4* 2.4* 2.5* 2.3* 2.4*   No results found for this basename: LIPASE, AMYLASE,  in the last 168 hours  Recent Labs Lab 10/26/13 1300  AMMONIA 10*   CBC:  Recent Labs Lab 10/25/13 0517 10/26/13 0750 10/27/13 0600 10/28/13 0600 10/29/13 0605 10/30/13 0915  WBC 10.9* 9.9 8.6 7.8 10.0 9.1  NEUTROABS 7.1 6.4 6.0 5.0 7.1  --   HGB 9.9* 10.2* 10.3* 10.1* 10.7* 9.8*  HCT 31.4* 32.6* 32.4* 31.8* 33.9* 30.7*  MCV 89.7 89.3 88.8 90.3 90.4 89.0  PLT 589* 605* PLATELET CLUMPS NOTED ON SMEAR, COUNT APPEARS INCREASED 561* 533* 469*   Cardiac Enzymes: No results found for this basename: CKTOTAL, CKMB, CKMBINDEX, TROPONINI,  in the last 168  hours BNP (last 3 results)  Recent Labs  10/01/13 1135  PROBNP 1397.0*   CBG:  Recent Labs Lab 10/29/13 1542 10/29/13 2008 10/30/13 10/30/13 0420 10/30/13 0753  GLUCAP 143* 101* 168* 129* 121*    Recent Results (from the past 240 hour(s))  CULTURE, BLOOD (ROUTINE X 2)     Status: None   Collection Time    10/22/13  1:10 PM      Result Value Ref Range Status   Specimen Description BLOOD RIGHT HAND   Final   Special Requests BOTTLES DRAWN AEROBIC ONLY 3CC   Final   Culture  Setup Time     Final   Value: 10/22/2013 21:20     Performed at [facility]   Culture     Final   Value: NO GROWTH 5 DAYS     Performed at [facility]   Report Status 10/28/2013 FINAL   Final  CULTURE, BLOOD (ROUTINE X 2)     Status: None   Collection Time    10/22/13  9:55 PM      Result Value Ref Range Status   Specimen Description BLOOD RIGHT HAND   Final   Special Requests BOTTLES DRAWN AEROBIC ONLY [facility]   Final   Culture  Setup Time     Final   Value: 10/23/2013 01:36     Performed at [facility]   Culture     Final   Value: NO GROWTH 5 DAYS     Performed at [facility]   Report Status 10/29/2013 FINAL   Final     Studies: No  results found.  Scheduled Meds: . bisacodyl  5 mg Oral BID  .  ceFAZolin (ANCEF) IV  2 g Intravenous Once  . darbepoetin (ARANESP) injection - DIALYSIS  25 mcg Intravenous Q Tue-HD  . feeding supplement (PRO-STAT SUGAR FREE 64)  30 mL Per Tube Q1200  . FLUoxetine  20 mg Per Tube Daily  . free water  175 mL Per Tube QID  . heparin subcutaneous  5,000 Units Subcutaneous 3 times per day  . insulin aspart  0-20 Units Subcutaneous 6 times per day  . labetalol  200 mg Oral BID  . multivitamin  1 tablet Oral QHS  . neomycin-bacitracin-polymyxin   Topical TID  . ondansetron (ZOFRAN) IV  4 mg Intravenous 3 times per day  . sodium chloride  3 mL Intravenous Q12H   Continuous Infusions: . feeding supplement (NEPRO CARB STEADY) 1,000 mL (10/27/13 0223)    Principal Problem:   Encephalopathy, metabolic Active Problems:   HYPERTENSION   HEPATITIS C, HX OF   CEREBROVASCULAR ACCIDENT, HX OF   BENIGN PROSTATIC HYPERTROPHY   Hyperlipidemia   Anemia due to chronic illness   DM (diabetes mellitus) type I controlled with renal manifestation   Chronic combined systolic (EF 16%) and grade 2diastolic congestive heart failure   FTT (failure to thrive) in adult   Dehydration   Fever    Time spent: 25 minutes.     Niel Hummer A  [facility] Pager [phone]. If 7PM-7AM, please contact night-coverage at [email or web address], password [facility] 10/30/2013, 1:54 PM  LOS: 29 days

## 2013-10-30 NOTE — Progress Notes (Addendum)
Subjective: Not giving much verbal response, on HD now  Filed Vitals:   10/30/13 0848 10/30/13 0853 10/30/13 0930 10/30/13 1000  BP: 134/86 130/80 123/79 115/74  Pulse: 72 72 82 82  Temp:      TempSrc:      Resp: 18     Height:      Weight:      SpO2:       Exam: Awakens easily, responds verbally but minimally No jvd Chest clear bilat RRR no MRG Abd soft, NTND Bilat BKA wrapped, no edema LUA AVF patent Neuro not responding enough to determine orientation   Dialysis: [facility] 4h 33min  Old dry wt 83kg, now 68-74kg   Heparin 5000/2400   2/2.0 Bath   LFA AVF   Hectorol 4     Epo none     Venofer- finished load thru 2/24   Assessment: 1 AMS- multifactorial (depression, hx CVA, TME), MRI negative acute 2 R BKA revision 3/18 3 ESRD on hemodialysis 4 Anemia cont darbe 25/wk, last tsat 40% 5 HTN/vol- no vol excess on exam, new dry wt 68-74kg, on labetalol only 6 Nutrition on Nepro TF's, MVI 7 MBD ^Ca w vit D on hold, low phos, no binders 8 FTT / debility / confusion-  not good candidate for OP hemodialysis in his condition, recommending LTAC 9 Depression on prozac thru tube   Plan- HD today    Kelly Splinter MD  pager [phone]    cell [phone]  10/30/2013, 10:36 AM     Recent Labs Lab 10/24/13 0826  10/27/13 0600 10/28/13 0600 10/30/13 0915  NA 142  < > 136* 136* 136*  K 3.4*  < > 4.2 3.8 3.7  CL 98  < > 93* 91* 92*  CO2 26  < > 24 27 25   GLUCOSE 165*  < > 139* 131* 143*  BUN 55*  < > 25* 41* 41*  CREATININE 7.30*  < > 3.62* 5.42* 5.17*  CALCIUM 10.0  < > 9.3 9.5 9.5  PHOS 4.8*  --   --  4.4 4.2  < > = values in this interval not displayed.  Recent Labs Lab 10/25/13 0517 10/26/13 0750 10/27/13 0600 10/28/13 0600 10/30/13 0915  AST 41* 52* 47*  --   --   ALT 21 16 11   --   --   ALKPHOS 72 72 76  --   --   BILITOT 0.4 0.4 0.3  --   --   PROT 8.4* 8.6* 8.4*  --   --   ALBUMIN 2.4* 2.4* 2.5* 2.3* 2.4*    Recent Labs Lab 10/27/13 0600  10/28/13 0600 10/29/13 0605 10/30/13 0915  WBC 8.6 7.8 10.0 9.1  NEUTROABS 6.0 5.0 7.1  --   HGB 10.3* 10.1* 10.7* 9.8*  HCT 32.4* 31.8* 33.9* 30.7*  MCV 88.8 90.3 90.4 89.0  PLT PLATELET CLUMPS NOTED ON SMEAR, COUNT APPEARS INCREASED 561* 533* 469*   . bisacodyl  5 mg Oral BID  .  ceFAZolin (ANCEF) IV  2 g Intravenous Once  . darbepoetin (ARANESP) injection - DIALYSIS  25 mcg Intravenous Q Tue-HD  . feeding supplement (PRO-STAT SUGAR FREE 64)  30 mL Per Tube Q1200  . FLUoxetine  20 mg Per Tube Daily  . free water  175 mL Per Tube QID  . heparin subcutaneous  5,000 Units Subcutaneous 3 times per day  . insulin aspart  0-20 Units Subcutaneous 6 times per day  . labetalol  200 mg  Oral BID  . multivitamin  1 tablet Oral QHS  . neomycin-bacitracin-polymyxin   Topical TID  . ondansetron (ZOFRAN) IV  4 mg Intravenous 3 times per day  . sodium chloride  3 mL Intravenous Q12H   . feeding supplement (NEPRO CARB STEADY) 1,000 mL (10/27/13 0223)   sodium chloride, acetaminophen, acetaminophen, fentaNYL, haloperidol, hydrALAZINE, metoCLOPramide (REGLAN) injection, metoCLOPramide, midazolam, morphine injection, ondansetron (ZOFRAN) IV, ondansetron

## 2013-10-31 DIAGNOSIS — N049 Nephrotic syndrome with unspecified morphologic changes: Secondary | ICD-10-CM | POA: Diagnosis not present

## 2013-10-31 DIAGNOSIS — F329 Major depressive disorder, single episode, unspecified: Secondary | ICD-10-CM | POA: Diagnosis not present

## 2013-10-31 DIAGNOSIS — L8993 Pressure ulcer of unspecified site, stage 3: Secondary | ICD-10-CM | POA: Diagnosis not present

## 2013-10-31 DIAGNOSIS — E46 Unspecified protein-calorie malnutrition: Secondary | ICD-10-CM | POA: Diagnosis not present

## 2013-10-31 DIAGNOSIS — E119 Type 2 diabetes mellitus without complications: Secondary | ICD-10-CM | POA: Diagnosis not present

## 2013-10-31 DIAGNOSIS — L97809 Non-pressure chronic ulcer of other part of unspecified lower leg with unspecified severity: Secondary | ICD-10-CM | POA: Diagnosis not present

## 2013-10-31 DIAGNOSIS — E785 Hyperlipidemia, unspecified: Secondary | ICD-10-CM | POA: Diagnosis not present

## 2013-10-31 DIAGNOSIS — E1129 Type 2 diabetes mellitus with other diabetic kidney complication: Secondary | ICD-10-CM | POA: Diagnosis not present

## 2013-10-31 DIAGNOSIS — E1029 Type 1 diabetes mellitus with other diabetic kidney complication: Secondary | ICD-10-CM | POA: Diagnosis not present

## 2013-10-31 DIAGNOSIS — I5040 Unspecified combined systolic (congestive) and diastolic (congestive) heart failure: Secondary | ICD-10-CM | POA: Diagnosis not present

## 2013-10-31 DIAGNOSIS — R64 Cachexia: Secondary | ICD-10-CM | POA: Diagnosis not present

## 2013-10-31 DIAGNOSIS — I10 Essential (primary) hypertension: Secondary | ICD-10-CM | POA: Diagnosis not present

## 2013-10-31 DIAGNOSIS — T8789 Other complications of amputation stump: Secondary | ICD-10-CM | POA: Diagnosis not present

## 2013-10-31 DIAGNOSIS — E872 Acidosis, unspecified: Secondary | ICD-10-CM | POA: Diagnosis not present

## 2013-10-31 DIAGNOSIS — R509 Fever, unspecified: Secondary | ICD-10-CM | POA: Diagnosis not present

## 2013-10-31 DIAGNOSIS — E870 Hyperosmolality and hypernatremia: Secondary | ICD-10-CM | POA: Diagnosis not present

## 2013-10-31 DIAGNOSIS — T8740 Infection of amputation stump, unspecified extremity: Secondary | ICD-10-CM | POA: Diagnosis not present

## 2013-10-31 DIAGNOSIS — E875 Hyperkalemia: Secondary | ICD-10-CM | POA: Diagnosis not present

## 2013-10-31 DIAGNOSIS — R1311 Dysphagia, oral phase: Secondary | ICD-10-CM | POA: Diagnosis not present

## 2013-10-31 DIAGNOSIS — I739 Peripheral vascular disease, unspecified: Secondary | ICD-10-CM | POA: Diagnosis not present

## 2013-10-31 DIAGNOSIS — L89309 Pressure ulcer of unspecified buttock, unspecified stage: Secondary | ICD-10-CM | POA: Diagnosis not present

## 2013-10-31 DIAGNOSIS — E41 Nutritional marasmus: Secondary | ICD-10-CM | POA: Diagnosis not present

## 2013-10-31 DIAGNOSIS — B1920 Unspecified viral hepatitis C without hepatic coma: Secondary | ICD-10-CM | POA: Diagnosis not present

## 2013-10-31 DIAGNOSIS — L8990 Pressure ulcer of unspecified site, unspecified stage: Secondary | ICD-10-CM | POA: Diagnosis not present

## 2013-10-31 DIAGNOSIS — F3289 Other specified depressive episodes: Secondary | ICD-10-CM | POA: Diagnosis not present

## 2013-10-31 DIAGNOSIS — Z992 Dependence on renal dialysis: Secondary | ICD-10-CM | POA: Diagnosis not present

## 2013-10-31 DIAGNOSIS — N19 Unspecified kidney failure: Secondary | ICD-10-CM | POA: Diagnosis not present

## 2013-10-31 DIAGNOSIS — L97909 Non-pressure chronic ulcer of unspecified part of unspecified lower leg with unspecified severity: Secondary | ICD-10-CM | POA: Diagnosis not present

## 2013-10-31 DIAGNOSIS — R52 Pain, unspecified: Secondary | ICD-10-CM | POA: Diagnosis not present

## 2013-10-31 DIAGNOSIS — N250 Renal osteodystrophy: Secondary | ICD-10-CM | POA: Diagnosis not present

## 2013-10-31 DIAGNOSIS — N2581 Secondary hyperparathyroidism of renal origin: Secondary | ICD-10-CM | POA: Diagnosis not present

## 2013-10-31 DIAGNOSIS — I5042 Chronic combined systolic (congestive) and diastolic (congestive) heart failure: Secondary | ICD-10-CM | POA: Diagnosis not present

## 2013-10-31 DIAGNOSIS — D649 Anemia, unspecified: Secondary | ICD-10-CM | POA: Diagnosis not present

## 2013-10-31 DIAGNOSIS — G8918 Other acute postprocedural pain: Secondary | ICD-10-CM | POA: Diagnosis not present

## 2013-10-31 DIAGNOSIS — N058 Unspecified nephritic syndrome with other morphologic changes: Secondary | ICD-10-CM | POA: Diagnosis not present

## 2013-10-31 DIAGNOSIS — M869 Osteomyelitis, unspecified: Secondary | ICD-10-CM | POA: Diagnosis not present

## 2013-10-31 DIAGNOSIS — R630 Anorexia: Secondary | ICD-10-CM | POA: Diagnosis not present

## 2013-10-31 DIAGNOSIS — G929 Unspecified toxic encephalopathy: Secondary | ICD-10-CM | POA: Diagnosis not present

## 2013-10-31 DIAGNOSIS — I700 Atherosclerosis of aorta: Secondary | ICD-10-CM | POA: Diagnosis not present

## 2013-10-31 DIAGNOSIS — T879 Unspecified complications of amputation stump: Secondary | ICD-10-CM | POA: Diagnosis not present

## 2013-10-31 DIAGNOSIS — S88119A Complete traumatic amputation at level between knee and ankle, unspecified lower leg, initial encounter: Secondary | ICD-10-CM | POA: Diagnosis not present

## 2013-10-31 DIAGNOSIS — R5381 Other malaise: Secondary | ICD-10-CM | POA: Diagnosis not present

## 2013-10-31 DIAGNOSIS — Z6841 Body Mass Index (BMI) 40.0 and over, adult: Secondary | ICD-10-CM | POA: Diagnosis not present

## 2013-10-31 DIAGNOSIS — N400 Enlarged prostate without lower urinary tract symptoms: Secondary | ICD-10-CM | POA: Diagnosis not present

## 2013-10-31 DIAGNOSIS — E1069 Type 1 diabetes mellitus with other specified complication: Secondary | ICD-10-CM | POA: Diagnosis not present

## 2013-10-31 DIAGNOSIS — N189 Chronic kidney disease, unspecified: Secondary | ICD-10-CM | POA: Diagnosis not present

## 2013-10-31 DIAGNOSIS — L89109 Pressure ulcer of unspecified part of back, unspecified stage: Secondary | ICD-10-CM | POA: Diagnosis not present

## 2013-10-31 DIAGNOSIS — E109 Type 1 diabetes mellitus without complications: Secondary | ICD-10-CM | POA: Diagnosis not present

## 2013-10-31 DIAGNOSIS — I6789 Other cerebrovascular disease: Secondary | ICD-10-CM | POA: Diagnosis not present

## 2013-10-31 DIAGNOSIS — I5043 Acute on chronic combined systolic (congestive) and diastolic (congestive) heart failure: Secondary | ICD-10-CM | POA: Diagnosis not present

## 2013-10-31 DIAGNOSIS — R41841 Cognitive communication deficit: Secondary | ICD-10-CM | POA: Diagnosis not present

## 2013-10-31 DIAGNOSIS — I120 Hypertensive chronic kidney disease with stage 5 chronic kidney disease or end stage renal disease: Secondary | ICD-10-CM | POA: Diagnosis not present

## 2013-10-31 DIAGNOSIS — R262 Difficulty in walking, not elsewhere classified: Secondary | ICD-10-CM | POA: Diagnosis not present

## 2013-10-31 DIAGNOSIS — R279 Unspecified lack of coordination: Secondary | ICD-10-CM | POA: Diagnosis not present

## 2013-10-31 DIAGNOSIS — N186 End stage renal disease: Secondary | ICD-10-CM | POA: Diagnosis not present

## 2013-10-31 DIAGNOSIS — G894 Chronic pain syndrome: Secondary | ICD-10-CM | POA: Diagnosis not present

## 2013-10-31 DIAGNOSIS — I509 Heart failure, unspecified: Secondary | ICD-10-CM | POA: Diagnosis not present

## 2013-10-31 DIAGNOSIS — M86169 Other acute osteomyelitis, unspecified tibia and fibula: Secondary | ICD-10-CM | POA: Diagnosis not present

## 2013-10-31 DIAGNOSIS — G9341 Metabolic encephalopathy: Secondary | ICD-10-CM | POA: Diagnosis not present

## 2013-10-31 DIAGNOSIS — F0150 Vascular dementia without behavioral disturbance: Secondary | ICD-10-CM | POA: Diagnosis not present

## 2013-10-31 DIAGNOSIS — I871 Compression of vein: Secondary | ICD-10-CM | POA: Diagnosis not present

## 2013-10-31 DIAGNOSIS — Z905 Acquired absence of kidney: Secondary | ICD-10-CM | POA: Diagnosis not present

## 2013-10-31 DIAGNOSIS — L8995 Pressure ulcer of unspecified site, unstageable: Secondary | ICD-10-CM | POA: Diagnosis not present

## 2013-10-31 DIAGNOSIS — K8010 Calculus of gallbladder with chronic cholecystitis without obstruction: Secondary | ICD-10-CM | POA: Diagnosis not present

## 2013-10-31 DIAGNOSIS — R404 Transient alteration of awareness: Secondary | ICD-10-CM | POA: Diagnosis not present

## 2013-10-31 DIAGNOSIS — I2510 Atherosclerotic heart disease of native coronary artery without angina pectoris: Secondary | ICD-10-CM | POA: Diagnosis not present

## 2013-10-31 DIAGNOSIS — G8929 Other chronic pain: Secondary | ICD-10-CM | POA: Diagnosis not present

## 2013-10-31 DIAGNOSIS — D638 Anemia in other chronic diseases classified elsewhere: Secondary | ICD-10-CM | POA: Diagnosis not present

## 2013-10-31 DIAGNOSIS — I69998 Other sequelae following unspecified cerebrovascular disease: Secondary | ICD-10-CM | POA: Diagnosis not present

## 2013-10-31 DIAGNOSIS — Z8673 Personal history of transient ischemic attack (TIA), and cerebral infarction without residual deficits: Secondary | ICD-10-CM | POA: Diagnosis not present

## 2013-10-31 DIAGNOSIS — K632 Fistula of intestine: Secondary | ICD-10-CM | POA: Diagnosis not present

## 2013-10-31 DIAGNOSIS — T82898A Other specified complication of vascular prosthetic devices, implants and grafts, initial encounter: Secondary | ICD-10-CM | POA: Diagnosis not present

## 2013-10-31 DIAGNOSIS — R05 Cough: Secondary | ICD-10-CM | POA: Diagnosis not present

## 2013-10-31 DIAGNOSIS — A419 Sepsis, unspecified organism: Secondary | ICD-10-CM | POA: Diagnosis not present

## 2013-10-31 DIAGNOSIS — R4701 Aphasia: Secondary | ICD-10-CM | POA: Diagnosis not present

## 2013-10-31 DIAGNOSIS — H33049 Retinal detachment with retinal dialysis, unspecified eye: Secondary | ICD-10-CM | POA: Diagnosis not present

## 2013-10-31 DIAGNOSIS — R627 Adult failure to thrive: Secondary | ICD-10-CM | POA: Diagnosis not present

## 2013-10-31 DIAGNOSIS — M6281 Muscle weakness (generalized): Secondary | ICD-10-CM | POA: Diagnosis not present

## 2013-10-31 DIAGNOSIS — R059 Cough, unspecified: Secondary | ICD-10-CM | POA: Diagnosis not present

## 2013-10-31 DIAGNOSIS — R4182 Altered mental status, unspecified: Secondary | ICD-10-CM | POA: Diagnosis not present

## 2013-10-31 DIAGNOSIS — N179 Acute kidney failure, unspecified: Secondary | ICD-10-CM | POA: Diagnosis not present

## 2013-10-31 DIAGNOSIS — I96 Gangrene, not elsewhere classified: Secondary | ICD-10-CM | POA: Diagnosis not present

## 2013-10-31 DIAGNOSIS — G8928 Other chronic postprocedural pain: Secondary | ICD-10-CM | POA: Diagnosis not present

## 2013-10-31 DIAGNOSIS — I5041 Acute combined systolic (congestive) and diastolic (congestive) heart failure: Secondary | ICD-10-CM | POA: Diagnosis not present

## 2013-10-31 DIAGNOSIS — K219 Gastro-esophageal reflux disease without esophagitis: Secondary | ICD-10-CM | POA: Diagnosis not present

## 2013-10-31 LAB — CBC WITH DIFFERENTIAL/PLATELET
Basophils Absolute: 0 10*3/uL (ref 0.0–0.1)
Basophils Relative: 1 % (ref 0–1)
EOS ABS: 0.2 10*3/uL (ref 0.0–0.7)
EOS PCT: 3 % (ref 0–5)
HEMATOCRIT: 30.5 % — AB (ref 39.0–52.0)
HEMOGLOBIN: 9.6 g/dL — AB (ref 13.0–17.0)
LYMPHS PCT: 23 % (ref 12–46)
Lymphs Abs: 1.9 10*3/uL (ref 0.7–4.0)
MCH: 28.3 pg (ref 26.0–34.0)
MCHC: 31.5 g/dL (ref 30.0–36.0)
MCV: 90 fL (ref 78.0–100.0)
MONOS PCT: 8 % (ref 3–12)
Monocytes Absolute: 0.7 10*3/uL (ref 0.1–1.0)
Neutro Abs: 5.4 10*3/uL (ref 1.7–7.7)
Neutrophils Relative %: 66 % (ref 43–77)
Platelets: 300 10*3/uL (ref 150–400)
RBC: 3.39 MIL/uL — ABNORMAL LOW (ref 4.22–5.81)
RDW: 20.9 % — ABNORMAL HIGH (ref 11.5–15.5)
WBC: 8.3 10*3/uL (ref 4.0–10.5)

## 2013-10-31 LAB — GLUCOSE, CAPILLARY
GLUCOSE-CAPILLARY: 114 mg/dL — AB (ref 70–99)
GLUCOSE-CAPILLARY: 170 mg/dL — AB (ref 70–99)
Glucose-Capillary: 100 mg/dL — ABNORMAL HIGH (ref 70–99)
Glucose-Capillary: 130 mg/dL — ABNORMAL HIGH (ref 70–99)
Glucose-Capillary: 146 mg/dL — ABNORMAL HIGH (ref 70–99)

## 2013-10-31 LAB — HEPATITIS B E ANTIGEN: Hep B E Ag: NONREACTIVE

## 2013-10-31 MED ORDER — FLUOXETINE HCL 20 MG/5ML PO SOLN: 20.0000 mg | Freq: Every day | ORAL | Status: DC

## 2013-10-31 MED ORDER — NEPRO/CARBSTEADY PO LIQD: 1000.0000 mL | ORAL | Status: DC

## 2013-10-31 MED ORDER — FREE WATER: 175.0000 mL | Freq: Four times a day (QID) | Status: DC

## 2013-10-31 MED ORDER — DARBEPOETIN ALFA-POLYSORBATE 25 MCG/0.42ML IJ SOLN: 25.0000 ug | INTRAMUSCULAR | Status: DC

## 2013-10-31 MED ORDER — RENA-VITE PO TABS: 1.0000 | ORAL_TABLET | Freq: Every day | ORAL | Status: DC

## 2013-10-31 MED ORDER — BACITRACIN-NEOMYCIN-POLYMYXIN OINTMENT TUBE: 1.0000 "application " | TOPICAL_OINTMENT | Freq: Three times a day (TID) | CUTANEOUS | Status: DC

## 2013-10-31 MED ORDER — PRO-STAT SUGAR FREE PO LIQD: 30.0000 mL | Freq: Every day | ORAL | Status: DC

## 2013-10-31 MED ORDER — BISACODYL 5 MG PO TBEC: 5.0000 mg | DELAYED_RELEASE_TABLET | Freq: Two times a day (BID) | ORAL | Status: DC

## 2013-10-31 NOTE — Progress Notes (Signed)
Report called to Ginny Forth, RN at [place]. Patient's history, status, current orders, vitals, etc. discussed. Time allowed for questions/comments/concerns. My contact information given to [place]. Transportation scheduled for 1800. Phone consent obtained from patient's wife, Sherlynn Stalls, for transport to [facility]. Allyson, RN (charge) served as second witness for this consent.  Will continue to monitor. - Soyla Dryer, RN

## 2013-10-31 NOTE — Progress Notes (Signed)
[facility]  called with bed assignment Room:  [address] Phone report: [phone] ext [phone]  Able to accept patient after 6:00 pm   Above given to charge nurse

## 2013-10-31 NOTE — Discharge Summary (Addendum)
Physician Discharge Summary  Oscar Castillo [name] DOB: [birth year] DOA: 10/01/2013  PCP: [name], MD  Admit date: 10/01/2013 Discharge date: 10/31/2013  Time spent: 35 minutes  Recommendations for Outpatient Follow-up:  1. Need dialysis 3 times per week.  2. Need further assessment of swallow evaluation.  3. Need continue PT.   Discharge Diagnoses:    Encephalopathy, metabolic   Osteomyelitis right BKA stump    HYPERTENSION   HEPATITIS C, HX OF   CEREBROVASCULAR ACCIDENT, HX OF   BENIGN PROSTATIC HYPERTROPHY   Hyperlipidemia   Anemia due to chronic illness   DM (diabetes mellitus) type I controlled with renal manifestation   Chronic combined systolic (EF [id number]) and grade 2diastolic congestive heart failure   FTT (failure to thrive) in adult   Dehydration   Fever   Discharge Condition: stable  Diet recommendation: on tube feeding.   Filed Weights   10/30/13 0836 10/30/13 1340 10/30/13 2012  Weight: 77 kg (169 lb 12.1 oz) 76 kg (167 lb 8.8 oz) 81 kg (178 lb 9.2 oz)    History of present illness:  Oscar Castillo is a 64 y.o. male known history end-stage renal disease November 2013 on HD-t/Th/Sat, diabetes mellitus, gangrene left toe status post amputation 04/2013, left leg BKA 05/2013, right leg BKA 08/2013, GI bleed 11/2012-nondiagnostic EGD, unprepped colonoscopy-capsule endoscopy =? Ulcer vs. mass small bowel, history left tibial nail and ORIF 08/2012, history chronic combined systolic/diastolic heart failure EF 45, history hepatitis C, prior lacunar CVA 08/23/07, BPH sent came to [facility] 10/01/2013 with Altered mental status  Is unable to give a clear history however his wife says that patient was doing well until his post followup appointment with Dr. Sharol Given of orthopedics. He went to his office testing was removed but was becoming increasingly cool incoherent and was not really eating or drinking despite not eating. He would only want to teaspoons at a time and his wife noticed a  change in his mental state  By 09/24/13 he was pretty confused and would not work with therapy and a meeting was called on 09/26/13 because he was failing to thrive and nursing home staff as well as therapist thought that patient was depressed and had just given up in general on himself.  He seemed to rally 3/6 and was eating a little more and was a little bit more coherent however this morning at 7:15 he was not responding at all would not take meds had his mouth shut completely.  He had been treated since 09/29/13 with a potential wound on the stump where he had surgery recently he was given 2 antibiotics per report from wife and on review of the chart it looks like those were  His food seem to cause the most cough recently per wife and on 09/28/13 the dysphagia pure his food.  He is very confused at the bedside and not really able to give me a history and seems to be falling out of the bed  Emergency room physician attempted lumbar puncture and interventional radiology will also attempt the same given his toxic metabolic encephalopathy.    Hospital Course:  Patient admitted with confusion, encephalopathy, osteomyelitis Right BKA.   Encephalopathy, metabolic;  -Suspect dehydration and possible underlying infectious processes as etiology, metabolic,; Patient answering simple questions. ( yes and no answer). Saying few more words today. He will need continue care.  - MRI negative for acute stroke.  -ammonia level 10.  -peg tube in place for nutrition.  Osteomyelitis right BKA stump  -Pain is increasing since surgery in February 2015. MRI 3/13 shows pathology see results below  -3/14 MRI of right BKA stump shows abscess/osteomyelitis see results below  -3/30 MRI right BKA stump negative for osteomyelitis see results below  -3/15 seen by Dr. Jean Rosenthal (orthopedic surgery), round discuss abnormal findings of MRI right BKA stump and decide on course of action on 3/16 (debridement, stump  revision?)  -Dr. Meridee Score (orthopedic surgeon) revised right BKA stump secondary abscess/osteomyelitis on 3/18  -Discussed with Dr Sharol Given no need for antibiotics.   HYPERTENSION  -Continue hydralazine 5 mg PRN SBP> 160 or DBP> 100  -continue labetalol 200 mg BID   Anemia due to chronic illness  Follow trend. Hb at 9.8   DM (diabetes mellitus) type I controlled with renal manifestation  -Continue resistant SSI; CBG currently controlled on this regimen   ESRD;  -needs dialysis M, W, F.   Chronic combined systolic (EF [id number]) and grade 2 diastolic congestive heart failure  -Compensated and managed with dialysis on T/Th/Sat   HEPATITIS C, HX OF  -Liver enzymes have been stable since patient's admission  -Hepatitis e antigen negative.   CEREBROVASCULAR ACCIDENT, HX OF  -No evidence of acute infarct  -3/26 MRI of brain noncontrast negative acute finding.   Fever ; resolved.  -Patient unable to contribute to history and given altered mentation and fever an LP was done to rule out possible meningitis-CSF studies pending-partial results with 35 of protein and 156 of glucose so not indicative of bacterial meningitis-CSF culture no growth  -Influenza PCR negative  -Blood cultures no growth to date urinalysis unremarkable  -3/14 Right BKA stump shows abscess/osteomyelitis by MRI;; see results below  -3/18 surgeon's S/P abscess drainage/revision of right BKA stump on Wednesday 3/18   New LBBB  -has been transient in nature  -systolic function normal but septal motion with abnormal function and dyssynergy  -cardiac enzymes negative.   Nutrition  -Nephro at 45 ml/hr   Procedures: IR placement of 20 Fr pull through G tube placed on 10/25/2013,  3/26/to 15 MRI right tibia/fibula without contrast  1. No evidence of osteomyelitis or soft tissue abscess or  cellulitis.  2. Small hematoma in the posterior aspect of the stump.  3. Small right knee effusion, unchanged since the prior exam and   only minimally more prominent than in the opposite knee.  10/19/2013 MR head without contrast  Progression of atrophy and small vessel disease. No acute  intracranial findings.  No interval change in the appearance of multiple small foci of  chronic hemorrhage.  No proximal significant flow reducing lesion is evident.  10/11/2013 revision of right BKA secondary to Osteomyelitis/Abscess  MRI tib-fib right BKA stump 10/06/2013  Findings consistent with a focal abscess at the tip of the stumps of  the distal tibia and fibula.  Subtle edema at the tip of the stumps of the tibia and fibula most  likely represent focal osteomyelitis because the fluid collections,  which probably represent pus, are immediately adjacent to the bones.  but there is no discrete bone destruction.  Probable adjacent myositis/cellulitis.  CT head without contrast 10/01/2013  1. There is no evidence of an acute intracranial hemorrhage nor of  an evolving ischemic infarction.  2. There is stable diffuse atrophy with evidence of chronic small  vessel ischemic change.  3. There is no intracranial mass effect.  4. There is no evidence of an acute skull fracture.  Lumbar puncture  by interventional radiology  Antibiotics:  Zosyn 3/14 >> stopped 3/28  Vancomycin 3/14>> stopped 3/29  Aztreonam 3/08 >>> stopped 3/14  Vancomycin 3/08 >>> stopped 3/14    Consultations: Dr. Roney Jaffe Nephrology  Dr Meridee Score (Orthopedic Surgery)  Dr. Jean Rosenthal (orthopedic surgery)  Dr. Rhea Pink (palliative care)  Radiology for lumbar puncture   Discharge Exam: Filed Vitals:   10/31/13 1000  BP: 131/84  Pulse: 78  Temp: 98.6 F (37 C)  Resp: 18    General: no distress, alert, answer some questions.  Cardiovascular: S 1, S 2 RRR Respiratory: CTA  Discharge Instructions You were cared for by a hospitalist during your hospital stay. If you have any questions about your discharge medications or the care you  received while you were in the hospital after you are discharged, you can call the unit and asked to speak with the hospitalist on call if the hospitalist that took care of you is not available. Once you are discharged, your primary care physician will handle any further medical issues. Please note that NO REFILLS for any discharge medications will be authorized once you are discharged, as it is imperative that you return to your primary care physician (or establish a relationship with a primary care physician if you do not have one) for your aftercare needs so that they can reassess your need for medications and monitor your lab values.  Discharge Orders   Future Appointments Provider Department Dept Phone   11/29/2013 4:30 PM [name], MD [place] [phone]   Future Orders Complete By Expires   Change dressing  As directed    Scheduling Instructions:     Change dressing right transtibial amputation as needed. May wash residual limb was soap and water.   Increase activity slowly  As directed        Medication List    STOP taking these medications       docusate sodium 100 MG capsule  Commonly known as:  COLACE     doxycycline 100 MG tablet  Commonly known as:  ADOXA     HYDROcodone-acetaminophen 7.5-325 MG per tablet  Commonly known as:  NORCO     LANTUS SOLOSTAR Orin      TAKE these medications       acetaminophen 500 MG tablet  Commonly known as:  TYLENOL  Take 500 mg by mouth every 6 (six) hours as needed for mild pain.     amLODipine 10 MG tablet  Commonly known as:  NORVASC  Take 10 mg by mouth daily.     bisacodyl 5 MG EC tablet  Commonly known as:  DULCOLAX  Take 1 tablet (5 mg total) by mouth 2 (two) times daily.     brimonidine 0.2 % ophthalmic solution  Commonly known as:  ALPHAGAN  Place 1 drop into both eyes 2 (two) times daily.     calcium acetate 667 MG capsule  Commonly known as:  PHOSLO  Take 2 capsules (1,334 mg total)  by mouth 3 (three) times daily with meals.     feeding supplement (NEPRO CARB STEADY) Liqd  Place 1,000 mLs into feeding tube continuous.     feeding supplement (PRO-STAT SUGAR FREE 64) Liqd  Place 30 mLs into feeding tube daily at 12 noon.     FLUoxetine 20 MG/5ML solution  Commonly known as:  PROZAC  Place 5 mLs (20 mg total) into feeding tube daily.     free water Soln  Place 175 mLs into feeding tube 4 (four) times daily.     gabapentin 300 MG capsule  Commonly known as:  NEURONTIN  Take 300 mg by mouth daily as needed (for mild to moderate pain).     haloperidol 5 MG tablet  Commonly known as:  HALDOL  Take 5 mg by mouth at bedtime as needed for agitation.     labetalol 300 MG tablet  Commonly known as:  NORMODYNE  Take 1 tablet (300 mg total) by mouth 3 (three) times daily.     multivitamin Tabs tablet  Take 1 tablet by mouth at bedtime.     neomycin-bacitracin-polymyxin Oint  Commonly known as:  NEOSPORIN  Apply 1 application topically 3 (three) times daily.     saccharomyces boulardii 250 MG capsule  Commonly known as:  FLORASTOR  Take 250 mg by mouth 2 (two) times daily.     sertraline 100 MG tablet  Commonly known as:  ZOLOFT  Take 100 mg by mouth daily as needed. For depression     silver sulfADIAZINE 1 % cream  Commonly known as:  SILVADENE  Apply 1 application topically daily.     SIMBRINZA 1-0.2 % Susp  Generic drug:  Brinzolamide-Brimonidine  Place 1 drop into both eyes 2 (two) times daily.     Travoprost (BAK Free) 0.004 % Soln ophthalmic solution  Commonly known as:  TRAVATAN  Place 1 drop into both eyes at bedtime.     traZODone 50 MG tablet  Commonly known as:  DESYREL  Take 50 mg by mouth at bedtime as needed for sleep.     VITAMIN C PO  Take 1 tablet by mouth daily.       No Known Allergies     Follow-up Information   Follow up with DUDA,MARCUS V, MD In 2 weeks.   Specialty:  Orthopedic Surgery   Contact information:   [place] [address] [phone]       Follow up with [name], MD.   Specialties:  Internal Medicine, Radiology   Contact information:   [place] [address] [phone]        The results of significant diagnostics from this hospitalization (including imaging, microbiology, ancillary and laboratory) are listed below for reference.    Significant Diagnostic Studies: Ct Head Wo Contrast  10/16/2013   CLINICAL DATA:  Altered mental status.  Abnormal EEG.  EXAM: CT HEAD WITHOUT CONTRAST  TECHNIQUE: Contiguous axial images were obtained from the base of the skull through the vertex without intravenous contrast.  COMPARISON:  10/01/2013  FINDINGS: Mild cerebral atrophy is unchanged. Periventricular white matter hypodensities do not appear significantly changed and are nonspecific but compatible with mild chronic small vessel ischemic disease. There is no evidence of acute cortical infarct, intracranial hemorrhage, mass, midline shift, or extra-axial fluid collection. Prior left cataract surgery is noted. The visualized mastoid air cells are clear. Minimal maxillary sinus mucosal thickening is noted bilaterally. Thickening of the left maxillary sinus wall suggestive of chronic sinusitis.  IMPRESSION: 1. No evidence of acute intracranial abnormality. 2. Unchanged, mild cerebral atrophy and chronic small vessel ischemic disease.   Electronically Signed   By: Logan Bores   On: 10/16/2013 22:57   Ct Head Wo Contrast  10/01/2013   CLINICAL DATA:  Mental status change.  EXAM: CT HEAD WITHOUT CONTRAST  TECHNIQUE: Contiguous axial images were obtained from the base of the skull through the vertex without  intravenous contrast.  COMPARISON:  CT HEAD W/O CM dated 09/29/2013  FINDINGS: There is mild stable diffuse cerebral and cerebellar atrophy with compensatory ventriculomegaly. There is no intracranial hemorrhage nor intracranial mass effect. There is no evidence of  an evolving ischemic infarction. There is decreased density in the deep white matter of both cerebral hemispheres consistent with chronic small vessel ischemic type change.  At bone window settings there is hyperostosis frontalis interna. There is no evidence of an acute skull fracture. The observed portions of the paranasal sinuses and mastoid air cells are clear.  IMPRESSION: 1. There is no evidence of an acute intracranial hemorrhage nor of an evolving ischemic infarction. 2. There is stable diffuse atrophy with evidence of chronic small vessel ischemic change. 3. There is no intracranial mass effect. 4. There is no evidence of an acute skull fracture.   Electronically Signed   By: David  Martinique   On: 10/01/2013 13:42   Mr Jodene Nam Head Wo Contrast  10/19/2013   CLINICAL DATA:  Altered mental status.  Unable to communicate.  EXAM: MRI HEAD WITHOUT CONTRAST  MRA HEAD WITHOUT CONTRAST  TECHNIQUE: Multiplanar, multiecho pulse sequences of the brain and surrounding structures were obtained without intravenous contrast. Angiographic images of the head were obtained using MRA technique without contrast.  COMPARISON:  CT HEAD W/O CM dated 10/16/2013; CT HEAD W/O CM dated 10/01/2013; MR HEAD W/O CM dated 02/26/2013  FINDINGS: MRI HEAD FINDINGS  Severe atrophy and chronic microvascular ischemic change are re- demonstrated. Sequelae of remote lacunar infarction are re- demonstrated. Numerous small foci of chronic hemorrhage on susceptibility weighted imaging suggest Advanced hypertensive cerebral vascular disease; amyloid angiopathy less likely in the absence of large lobar hemorrhage. No midline shift. No tonsillar herniation. Mild cervical spondylosis. Flow voids are maintained throughout the carotid, basilar, and vertebral arteries. Visualized calvarium, skull base, and upper cervical osseous structures unremarkable. Scalp and extracranial soft tissues, orbits, sinuses, and mastoids show no acute process.  Compared with most  recent prior MR, the atrophy has progressed slightly. There is slight worsening of advanced chronic microvascular ischemic change as well. No new areas of mineralization/hemorrhage.  MRA HEAD FINDINGS  The internal carotid arteries demonstrate no flow reducing lesion or significant irregularity. Bilateral fetal PCA origins contribute to basilar hypoplasia. Right vertebral dominant. Left vertebral ends in PICA. No proximal stenosis of the middle or posterior cerebral arteries. Mild irregularity of the A1 ACA on the right but likely to be significant. No cerebellar branch occlusion. No intracranial aneurysm.  IMPRESSION: Progression of atrophy and small vessel disease. No acute intracranial findings.  No interval change in the appearance of multiple small foci of chronic hemorrhage.  No proximal significant flow reducing lesion is evident.   Electronically Signed   By: Rolla Flatten M.D.   On: 10/19/2013 17:44   Mr Brain Wo Contrast  10/19/2013   CLINICAL DATA:  Altered mental status.  Unable to communicate.  EXAM: MRI HEAD WITHOUT CONTRAST  MRA HEAD WITHOUT CONTRAST  TECHNIQUE: Multiplanar, multiecho pulse sequences of the brain and surrounding structures were obtained without intravenous contrast. Angiographic images of the head were obtained using MRA technique without contrast.  COMPARISON:  CT HEAD W/O CM dated 10/16/2013; CT HEAD W/O CM dated 10/01/2013; MR HEAD W/O CM dated 02/26/2013  FINDINGS: MRI HEAD FINDINGS  Severe atrophy and chronic microvascular ischemic change are re- demonstrated. Sequelae of remote lacunar infarction are re- demonstrated. Numerous small foci of chronic hemorrhage on susceptibility weighted imaging suggest Advanced  hypertensive cerebral vascular disease; amyloid angiopathy less likely in the absence of large lobar hemorrhage. No midline shift. No tonsillar herniation. Mild cervical spondylosis. Flow voids are maintained throughout the carotid, basilar, and vertebral arteries. Visualized  calvarium, skull base, and upper cervical osseous structures unremarkable. Scalp and extracranial soft tissues, orbits, sinuses, and mastoids show no acute process.  Compared with most recent prior MR, the atrophy has progressed slightly. There is slight worsening of advanced chronic microvascular ischemic change as well. No new areas of mineralization/hemorrhage.  MRA HEAD FINDINGS  The internal carotid arteries demonstrate no flow reducing lesion or significant irregularity. Bilateral fetal PCA origins contribute to basilar hypoplasia. Right vertebral dominant. Left vertebral ends in PICA. No proximal stenosis of the middle or posterior cerebral arteries. Mild irregularity of the A1 ACA on the right but likely to be significant. No cerebellar branch occlusion. No intracranial aneurysm.  IMPRESSION: Progression of atrophy and small vessel disease. No acute intracranial findings.  No interval change in the appearance of multiple small foci of chronic hemorrhage.  No proximal significant flow reducing lesion is evident.   Electronically Signed   By: Rolla Flatten M.D.   On: 10/19/2013 17:44   Ct Abdomen Pelvis W Contrast  10/15/2013   CLINICAL DATA:  Fever of unknown origin. End-stage renal disease on hemodialysis.  EXAM: CT ABDOMEN AND PELVIS WITH CONTRAST  TECHNIQUE: Multidetector CT imaging of the abdomen and pelvis was performed using the standard protocol following bolus administration of intravenous contrast.  CONTRAST:  141mL OMNIPAQUE IOHEXOL 300 MG/ML  SOLN  COMPARISON:  11/06/2012  FINDINGS: Nasogastric tube is seen within the stomach. The liver, gallbladder, pancreas, spleen, and adrenal glands are normal in appearance. Bilateral renal parenchymal atrophy is seen. No evidence of renal masses or hydronephrosis. No masses or lymphadenopathy seen within the abdomen or pelvis.  Normal appendix is visualized. No evidence of inflammatory process or abnormal fluid collections. No evidence of bowel wall  thickening or dilatation.  IMPRESSION: No acute findings.  Bilateral renal parenchymal atrophy, consistent with end-stage renal disease. No evidence of hydronephrosis.   Electronically Signed   By: Earle Gell M.D.   On: 10/15/2013 19:50   Ir Gastrostomy Tube Mod Sed  10/25/2013   CLINICAL DATA:  Stroke  EXAM: PERCUTANEOUS GASTROSTOMY  FLUOROSCOPY TIME:  6 min and 49 seconds.  MEDICATIONS AND MEDICAL HISTORY: Versed 3 mg, Fentanyl 100 mcg.  ANESTHESIA/SEDATION: Moderate sedation time: 35 minutes  CONTRAST:  10 cc Omnipaque 300  PROCEDURE: The procedure, risks, benefits, and alternatives were explained to the patient. Questions regarding the procedure were encouraged and answered. The patient understands and consents to the procedure.  The epigastrium was prepped with Betadine in a sterile fashion, and a sterile drape was applied covering the operative field. A sterile gown and sterile gloves were used for the procedure.  A 5-French orogastric tube is placed under fluoroscopic guidance. Scout imaging of the abdomen confirms barium within the transverse colon.  The stomach was distended with gas. Under fluoroscopic guidance, an 18 gauge needle was utilized to puncture the anterior wall of the body of the stomach. An Amplatz wire was advanced through the needle passing a T fastener into the lumen of the stomach. The T fastener was secured for gastropexy. A 9-French sheath was inserted.  A snare was advanced through the 9-French sheath. A Britta Mccreedy was advanced through the orogastric tube. It was snared then pulled out the oral cavity, pulling the snare, as well. The leading edge of  the gastrostomy was attached to the snare. It was then pulled down the esophagus and out the percutaneous site. It was secured in place. Contrast was injected. No complication.  FINDINGS: The image demonstrates placement of a 20-French pull-through type gastrostomy tube into the body of the stomach.  IMPRESSION: Successful 20 French  pull-through gastrostomy.   Electronically Signed   By: Maryclare Bean M.D.   On: 10/25/2013 16:18   Mr Tibia Fibula Right Wo Contrast  10/19/2013   CLINICAL DATA:  Persistent fevers after revision of right lower extremity below knee amputation due to abscess.  EXAM: MRI OF LOWER RIGHT EXTREMITY WITHOUT CONTRAST  TECHNIQUE: Multiplanar, multisequence MR imaging was performed. No intravenous contrast was administered.  COMPARISON:  MRI dated 10/06/2013  FINDINGS: There is small hematoma in the posterior aspect of the stump measuring 6.6 x 3.0 x 4.0 cm. There is no evidence of an abscess. There is no osseous edema. There is a small right knee effusion which is unchanged. The adjacent muscle structures appear normal.  IMPRESSION: At and 1  1. No evidence of osteomyelitis or soft tissue abscess or cellulitis. 2. Small hematoma in the posterior aspect of the stump. 3. Small right knee effusion, unchanged since the prior exam and only minimally more prominent than in the opposite knee.   Electronically Signed   By: Rozetta Nunnery M.D.   On: 10/19/2013 17:08   Mr Tibia Fibula Right Wo Contrast  10/07/2013   CLINICAL DATA:  Nonhealing soft tissue ulceration stump of the right lower leg. Pain.  EXAM: MRI OF LOWER RIGHT EXTREMITY WITHOUT CONTRAST  TECHNIQUE: Multiplanar, multisequence MR imaging was performed. No intravenous contrast was administered.  COMPARISON:  None.  FINDINGS: There is a focal fluid collection measuring 4.8 x 3.1 x 1.6 cm around the stump of the right tibia with edema in the distal 12 mm of the tibial stump. There is no discrete bone destruction. There is also a tiny amount of fluid around the stump of the right fibula and also slight edema in the distal fibula.  There is slight edema in the adjacent muscles around at the tip of the stump.  There is only a small right knee effusion, similar to the left.  IMPRESSION: Findings consistent with a focal abscess at the tip of the stumps of the distal tibia and  fibula.  Subtle edema at the tip of the stumps of the tibia and fibula most likely represent focal osteomyelitis because the fluid collections, which probably represent pus, are immediately adjacent to the bones. but there is no discrete bone destruction.  Probable adjacent myositis/cellulitis.   Electronically Signed   By: Rozetta Nunnery M.D.   On: 10/07/2013 10:17   Dg Chest Port 1 View  10/22/2013   CLINICAL DATA:  Fever.  EXAM: PORTABLE CHEST - 1 VIEW  COMPARISON:  09/2013  FINDINGS: A nasogastric tube has been placed since the prior study. It extends into the proximal stomach.  Lungs are clear.  No pleural effusion or pneumothorax.  Heart, mediastinum hila are unremarkable.  IMPRESSION: No acute cardiopulmonary disease.   Electronically Signed   By: Lajean Manes M.D.   On: 10/22/2013 14:30   Dg Chest Port 1 View  10/13/2013   CLINICAL DATA:  Atelectasis, pneumonia  EXAM: PORTABLE CHEST - 1 VIEW  COMPARISON:  10/02/2013  FINDINGS: Cardiomediastinal silhouette is stable. No acute infiltrate or pleural effusion. No pulmonary edema. Bony thorax is unremarkable.  IMPRESSION: No active disease.   Electronically  Signed   By: Lahoma Crocker M.D.   On: 10/13/2013 10:20   Dg Chest Port 1 View  10/02/2013   CLINICAL DATA:  Shortness of Breath  EXAM: PORTABLE CHEST - 1 VIEW  COMPARISON:  the previous day's study  FINDINGS: Slightly lower lung volumes with some mild crowding of perihilar bronchovascular structures. No focal infiltrate or overt edema. Heart size normal. . No effusion. Visualized skeletal structures are unremarkable.  IMPRESSION: No acute cardiopulmonary disease.   Electronically Signed   By: Arne Cleveland M.D.   On: 10/02/2013 08:08   Dg Chest Port 1 View  10/01/2013   CLINICAL DATA:  Hypoxia, altered mental status.  EXAM: PORTABLE CHEST - 1 VIEW  COMPARISON:  DG CHEST 2 VIEW dated 06/09/2013  FINDINGS: The lungs are adequately inflated. There is no focal infiltrate. The cardiac silhouette is normal  in size. The pulmonary vascularity is not engorged. The mediastinum is normal in width. There is no pleural effusion or pneumothorax. The observed portions of the bony thorax appear normal.  IMPRESSION: There is no evidence of pneumonia nor CHF or other acute cardiopulmonary disease.   Electronically Signed   By: David  Martinique   On: 10/01/2013 11:52   Dg Abd Portable 1v  10/24/2013   CLINICAL DATA:  Evaluate barium prior to potential percutaneous gastrostomy tube placement  EXAM: PORTABLE ABDOMEN - 1 VIEW  COMPARISON:  DG ABD PORTABLE 1V dated 10/23/2013; CT ABD/PELVIS W CM dated 10/15/2013  FINDINGS: Administered enteric contrast is seen throughout the colon. There is a trace amount of residual barium within the gastric fundus. Enteric tube tip and side port overlie the expected location of the gastric fundus.  Nonobstructive bowel gas pattern. No supine evidence of pneumoperitoneum. No definite pneumatosis or portal venous gas.  Limited visualization of lower thorax is normal.  No acute osseus abnormalities.  IMPRESSION: Enteric contrast throughout the colon.   Electronically Signed   By: Sandi Mariscal M.D.   On: 10/24/2013 07:51   Dg Abd Portable 1v  10/23/2013   CLINICAL DATA:  Nasogastric tube location  EXAM: PORTABLE ABDOMEN - 1 VIEW  COMPARISON:  Prior abdominal radiograph 10/15/2013  FINDINGS: The tip of the nasogastric tube is in the gastric fundus. There is a small amount of contrast material within the gastric fundus and proximal small bowel. The visualized lungs are clear. The bowel gas pattern is not obstructed.  IMPRESSION: The tip of the nasogastric tube projects over the gastric fundus.   Electronically Signed   By: Jacqulynn Cadet M.D.   On: 10/23/2013 21:29   Dg Abd Portable 1v  10/15/2013   CLINICAL DATA:  Nasogastric tube placement at bedside.  EXAM: PORTABLE ABDOMEN - 1 VIEW  COMPARISON:  10/04/2013.  FINDINGS: Nasogastric tube tip in the fundus of the stomach. Bowel gas pattern  unremarkable.  IMPRESSION: Nasogastric tube tip in the fundus of the stomach. No acute abdominal abnormality.   Electronically Signed   By: Evangeline Dakin M.D.   On: 10/15/2013 03:41   Dg Abd Portable 1v  10/04/2013   CLINICAL DATA Constipation  EXAM PORTABLE ABDOMEN - 1 VIEW  COMPARISON 11/01/2012  FINDINGS Scattered large and small bowel gas is noted. No free air is seen. No abnormal mass or abnormal calcifications are noted. Mild degenerative changes of the lumbar spine are seen.  IMPRESSION No acute abnormality noted.  SIGNATURE  Electronically Signed   By: Inez Catalina M.D.   On: 10/04/2013 11:03   Dg Fluoro  Guide Lumbar Puncture  10/02/2013   CLINICAL DATA:  Altered mental status.  EXAM: DIAGNOSTIC LUMBAR PUNCTURE UNDER FLUOROSCOPIC GUIDANCE  FLUOROSCOPY TIME:  0 min 8 seconds  PROCEDURE: Informed consent was obtained from the patient prior to the procedure, including potential complications of headache, allergy, and pain. Informed consent was obtained by the patient's wife Sherlynn Stalls and appropriately documented on the consent form. Consent was witnessed by technologist Tally Due. Time out completed. With the patient prone, the lower back was prepped with Betadine. 1% Lidocaine was used for local anesthesia. Lumbar puncture was performed at the L4-L5 level using a 3.5 inch 20 gauge needle with return of clear CSF with an opening pressure of 19 cm water. Eight ml of CSF were obtained for laboratory studies. The patient tolerated the procedure well and there were no apparent complications.  IMPRESSION: Technically successful L4-L5 lumbar puncture. Laboratory analysis of CSF pending.   Electronically Signed   By: Dereck Ligas M.D.   On: 10/02/2013 11:14    Microbiology: Recent Results (from the past 240 hour(s))  CULTURE, BLOOD (ROUTINE X 2)     Status: None   Collection Time    10/22/13  1:10 PM      Result Value Ref Range Status   Specimen Description BLOOD RIGHT HAND   Final   Special  Requests BOTTLES DRAWN AEROBIC ONLY 3CC   Final   Culture  Setup Time     Final   Value: 10/22/2013 21:20     Performed at [facility]   Culture     Final   Value: NO GROWTH 5 DAYS     Performed at [facility]   Report Status 10/28/2013 FINAL   Final  CULTURE, BLOOD (ROUTINE X 2)     Status: None   Collection Time    10/22/13  9:55 PM      Result Value Ref Range Status   Specimen Description BLOOD RIGHT HAND   Final   Special Requests BOTTLES DRAWN AEROBIC ONLY 6CC   Final   Culture  Setup Time     Final   Value: 10/23/2013 01:36     Performed at [facility]   Culture     Final   Value: NO GROWTH 5 DAYS     Performed at [facility]   Report Status 10/29/2013 FINAL   Final     Labs: Basic Metabolic Panel:  Recent Labs Lab 10/25/13 0517 10/26/13 0750 10/27/13 0600 10/28/13 0600 10/30/13 0915  NA 137 139 136* 136* 136*  K 3.9 3.9 4.2 3.8 3.7  CL 94* 93* 93* 91* 92*  CO2 25 23 24 27 25   GLUCOSE 138* 94 139* 131* 143*  BUN 34* 44* 25* 41* 41*  CREATININE 4.35* 6.43* 3.62* 5.42* 5.17*  CALCIUM 9.6 9.7 9.3 9.5 9.5  MG 2.3 2.3 2.0  --   --   PHOS  --   --   --  4.4 4.2   Liver Function Tests:  Recent Labs Lab 10/25/13 0517 10/26/13 0750 10/27/13 0600 10/28/13 0600 10/30/13 0915  AST 41* 52* 47*  --   --   ALT 21 16 11   --   --   ALKPHOS 72 72 76  --   --   BILITOT 0.4 0.4 0.3  --   --   PROT 8.4* 8.6* 8.4*  --   --   ALBUMIN 2.4* 2.4* 2.5* 2.3* 2.4*   No results found for this basename:  LIPASE, AMYLASE,  in the last 168 hours  Recent Labs Lab 10/26/13 1300  AMMONIA 10*   CBC:  Recent Labs Lab 10/26/13 0750 10/27/13 0600 10/28/13 0600 10/29/13 0605 10/30/13 0915 10/31/13 0555  WBC 9.9 8.6 7.8 10.0 9.1 8.3  NEUTROABS 6.4 6.0 5.0 7.1  --  5.4  HGB 10.2* 10.3* 10.1* 10.7* 9.8* 9.6*  HCT 32.6* 32.4* 31.8* 33.9* 30.7* 30.5*  MCV 89.3 88.8 90.3 90.4 89.0 90.0  PLT 605* PLATELET CLUMPS NOTED ON SMEAR, COUNT APPEARS  INCREASED 561* 533* 469* 300   Cardiac Enzymes: No results found for this basename: CKTOTAL, CKMB, CKMBINDEX, TROPONINI,  in the last 168 hours BNP: BNP (last 3 results)  Recent Labs  10/01/13 1135  PROBNP 1397.0*   CBG:  Recent Labs Lab 10/30/13 1633 10/30/13 2009 10/31/13 0019 10/31/13 0406 10/31/13 0758  GLUCAP 151* 118* 130* 114* 146*       Signed:  Taneika Choi A  [facility] 10/31/2013, 11:30 AM

## 2013-10-31 NOTE — Progress Notes (Signed)
Physical Therapy Treatment Patient Details Name: Oscar Castillo MRN: [id number] DOB: [birth year] Today's Date: 10/31/2013    History of Present Illness 63 y.o. male admitted to [facility] on 10/01/13 with AMS and FTT.  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 Of note, he has history of L BKA as well. Pt with significant PMHx of diabetes and severe peripheral vascular disease who also has end-stage renal disease and undergoes dialysis.  S/p revision right transtibial amputation 3/18    PT Comments    Patient able to sit balanced unsupported today for couple of minutes.  Also stated desire to be out of bed, but not well tolerated once there with c/o ? Stomach pain.  Continue to recommend SNF versus LTACH for d/c disposition.  Follow Up Recommendations  SNF;Supervision/Assistance - 24 hour     Equipment Recommendations  None recommended by PT    Recommendations for Other Services       Precautions / Restrictions Precautions Precautions: Fall Restrictions Other Position/Activity Restrictions: bil BKA    Mobility  Bed Mobility Overal bed mobility: Needs Assistance Bed Mobility: Supine to Sit;Rolling Rolling: Mod assist   Supine to sit: Mod assist;HOB elevated Sit to supine: Mod assist   General bed mobility comments: patient able to initiate pulling up with increased time given; still needed assist once up to maintain sitting; rolled once back to bed for adjusting bed linens  Transfers Overall transfer level: Needs assistance           Anterior-Posterior transfers: +2 physical assistance;Max assist   General transfer comment: assist into recliner, pt continued to complain once up (?stomach pain) so assisted to scoot back into bed and positioned for comfort  Ambulation/Gait                 Stairs            Wheelchair Mobility    Modified Rankin (Stroke Patients Only)       Balance   Sitting-balance support: No upper extremity  supported Sitting balance-Leahy Scale: Fair Sitting balance - Comments: once placed in stable position on bed able to balance with hands in his lap for about 2 minutes                            Cognition Arousal/Alertness: Awake/alert Behavior During Therapy: Flat affect Overall Cognitive Status: Impaired/Different from baseline     Current Attention Level: Focused   Following Commands: Follows one step commands with increased time     Problem Solving: Slow processing;Decreased initiation;Requires verbal cues;Requires tactile cues      Exercises Other Exercises Other Exercises: AAROM right knee extension x 10 reps and positioned as straight as possible    General Comments General comments (skin integrity, edema, etc.): drainage from undressed wound on right BKA; dressed with 4x4 gauze and tape      Pertinent Vitals/Pain C/o pain up in chair; relieved after back in bed    Home Living                      Prior Function            PT Goals (current goals can now be found in the care plan section)      Frequency  Min 2X/week    PT Plan Current plan remains appropriate    Co-evaluation  End of Session   Activity Tolerance: Patient limited by pain Patient left: in bed;with call bell/phone within reach;with bed alarm set     Time: 1342-1410 PT Time Calculation (min): 28 min  Charges:  $Therapeutic Activity: 23-37 mins                    G Codes:      Oscar Castillo,Oscar Castillo November 12, 2013, 4:34 PM Magda Kiel, Oscar Castillo 11/12/2013

## 2013-10-31 NOTE — Progress Notes (Signed)
Subjective:  Awoke from sleep, nonverbal  Objective: Vital signs in last 24 hours: Temp:  [97.1 F (36.2 C)-99.2 F (37.3 C)] 99.2 F (37.3 C) (04/07 0408) Pulse Rate:  [65-88] 79 (04/07 0408) Resp:  [17-20] 18 (04/07 0408) BP: (114-136)/(68-86) 125/77 mmHg (04/07 0408) SpO2:  [98 %-100 %] 100 % (04/07 0408) Weight:  [76 kg (167 lb 8.8 oz)-81 kg (178 lb 9.2 oz)] 81 kg (178 lb 9.2 oz) (04/06 2012) Weight change: 4.7 kg (10 lb 5.8 oz)  Intake/Output from previous day: 04/06 0701 - 04/07 0700 In: 1205 [NG/GT:1205] Out: 1000    Lab Results:  Recent Labs  10/30/13 0915 10/31/13 0555  WBC 9.1 8.3  HGB 9.8* 9.6*  HCT 30.7* 30.5*  PLT 469* 300   BMET:  Recent Labs  10/30/13 0915  NA 136*  K 3.7  CL 92*  CO2 25  GLUCOSE 143*  BUN 41*  CREATININE 5.17*  CALCIUM 9.5  ALBUMIN 2.4*   No results found for this basename: PTH,  in the last 72 hours Iron Studies: No results found for this basename: IRON, TIBC, TRANSFERRIN, FERRITIN,  in the last 72 hours  Studies/Results: No results found.  EXAM: General appearance:  Sleepy, nonverbal, in no apparent distress Resp:  CTA without rales, rhonchi, or wheezes Cardio:  RRR without murmur or rub GI: + BS, soft and nontender Extremities:  B BKAs, no edema Access:  AVF @ LUA with + bruit  Dialysis: [facility]  4h 32min Old dry wt 83kg, now 68-74kg Heparin 5000/2400 2/2.0 Bath LFA AVF  Hectorol 4 Epo none Venofer- finished load thru 2/24  Assessment/Plan: 1. AMS - multifactorial (depression, Hx CVA, TME), MRI of brain negative for acute findings. 2. R BKA - 2/13, revision 3/18 by Dr. Sharol Given. 3. ESRD - HD on TTS @ [place], on MWF at [facility]  HD tomorrow. 4. HTN/Volume - BP 125/77 on Labetalol 200 mg bid; wt 81 kg s/p net UF 1 L yesterday. 5. Anemia - Hgb down to 9.6, Aranesp 25 mcg. 6. Sec HPT - Ca 9.5 (10.8 corrected), P 4.2; Hectorol on hold sec to high Ca, no binders. 7. Nutrition - Alb 2.4, currently with PEG,  multivitamin.  8. Depression - on Prozac. 9. FTT/debility/confusion - transfer to [facility].   LOS: 30 days   LYLES,CHARLES 10/31/2013,10:17 AM  Pt seen, examined and agree w A/P as above.  Kelly Splinter MD pager [phone]    cell [phone] 10/31/2013, 1:18 PM

## 2013-10-31 NOTE — Progress Notes (Signed)
NUTRITION FOLLOW UP  Pt meets criteria for severe MALNUTRITION in the context of acute illness as evidenced by moderate fat and muscle mass loss; 7% wt loss x 1 month.  Intervention:    Continue Nepro at 45 ml/hr and add 30 ml Prostat once daily via tube. Goal regimen provides: 2044 kcal, 102 grams protein, and 785 ml free water.  Continue free water flushes of 175 ml QID -- confirmed with renal PA, Marty Bergman 4/1. Total TF regimen + free water flushes will provide: 1485 ml free water daily.  RD to continue to follow nutrition care plan.  Nutrition Dx:   Increased nutrient needs related to wound healing, ESRD as evidenced by estimated nutrition needs, ongoing.  Goal:   Pt to meet >/= 90% of their estimated nutrition needs, met.  Monitor:   TF rate and tolerance, weight trends, labs, I/O's  Assessment:   63 y.o. 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  BSE completed by SLP on 3/10 with recommendations for Dysphagia 1 diet with thin liquids. Suspect continued inadequate intake 2/2 cognition. Palliative care meeting on 3/14 --> wife desires pt to continue as Full Code until mental status improves.   MRI of R tib/fib on 3/26 negative for abscess, osteo or cellulitis.   PEG placed 4/1. Pt receiviong Nepro at 45 ml/hr and 30 ml Prostat liquid protein daily. Also ordered for 175 ml free water flushes QID. RN reports that pt is tolerating well with no issues.  Potassium WNL Magnesium WNL Phosphorus WNL  Per renal, new EDW is 68-74kg   Pt to d/c to [facility] today  Height: Ht Readings from Last 1 Encounters:  10/04/13 4' 8" (1.422 m)  Height prior to amputations: 5'10" (177.8 cm)  Weight Status:   Wt Readings from Last 1 Encounters:  10/30/13 178 lb 9.2 oz (81 kg)  77.2 kg s/p HD on 3/28 79.2 kg s/p HD on 3/21 83.2 kg s/p HD on  3/19 72.2 kg s/p HD on 3/14 69.1 kg s/p HD on 3/12 78.4 kg s/p HD on 3/10  EDW per renal is 83 kg  Estimated needs:  Kcal: 1900-2100  Protein: 100-110 gm  Fluid: 1500 ml - discussed with renal PA (will aim to reach this with TF free water + free water flushes)  BMI: 28.1 kg/m2 -- adjusted for bilateral BKA's  Skin:  Stage II pressure ulcer on sacrum Stage II to L leg R leg incision  Diet Order: NPO   Intake/Output Summary (Last 24 hours) at 10/31/13 1239 Last data filed at 10/31/13 0700  Gross per 24 hour  Intake   1205 ml  Output   1000 ml  Net    205 ml    Last BM: 4/6 - "frequent soft stools"   Labs:   Recent Labs Lab 10/25/13 0517 10/26/13 0750 10/27/13 0600 10/28/13 0600 10/30/13 0915  NA 137 139 136* 136* 136*  K 3.9 3.9 4.2 3.8 3.7  CL 94* 93* 93* 91* 92*  CO2 25 23 24 27 25  BUN 34* 44* 25* 41* 41*  CREATININE 4.35* 6.43* 3.62* 5.42* 5.17*  CALCIUM 9.6 9.7 9.3 9.5 9.5  MG 2.3 2.3 2.0  --   --   PHOS  --   --   --  4.4 4.2  GLUCOSE 138* 94 139* 131* 143*    CBG (  last 3)   Recent Labs  10/31/13 0406 10/31/13 0758 10/31/13 1142  GLUCAP 114* 146* 100*    Scheduled Meds: . bisacodyl  5 mg Oral BID  .  ceFAZolin (ANCEF) IV  2 g Intravenous Once  . [START ON 11/01/2013] darbepoetin (ARANESP) injection - DIALYSIS  25 mcg Intravenous Q Wed-HD  . feeding supplement (PRO-STAT SUGAR FREE 64)  30 mL Per Tube Q1200  . FLUoxetine  20 mg Per Tube Daily  . free water  175 mL Per Tube QID  . heparin subcutaneous  5,000 Units Subcutaneous 3 times per day  . insulin aspart  0-20 Units Subcutaneous 6 times per day  . labetalol  200 mg Oral BID  . multivitamin  1 tablet Oral QHS  . neomycin-bacitracin-polymyxin   Topical TID  . ondansetron (ZOFRAN) IV  4 mg Intravenous 3 times per day  . sodium chloride  3 mL Intravenous Q12H    Continuous Infusions: . feeding supplement (NEPRO CARB STEADY) 1,000 mL (10/30/13 1740)    Inda Coke MS, RD,  LDN Inpatient Registered Dietitian Pager: [phone] After-hours pager: [phone]

## 2013-10-31 NOTE — Progress Notes (Signed)
   CARE MANAGEMENT NOTE 10/31/2013  Patient:  Oscar Castillo, Oscar Castillo   Account Number:  [id number]  Date Initiated:  10/03/2013  Documentation initiated by:  MAYO,HENRIETTA  Subjective/Objective Assessment:   dx AMS  At [facility] SNF for rehab    PCP  Dr [name]     Action/Plan:   discharge to [facility]   Anticipated DC Date:  10/31/2013   Anticipated DC Plan:  LONG TERM ACUTE CARE (LTAC)  In-house referral  Clinical Social Worker      DC Planning Services  CM consult      Choice offered to / List presented to:             Status of service:  Completed, signed off Medicare Important Message given?   (If response is "NO", the following Medicare IM given date fields will be blank) Date Medicare IM given:   Date Additional Medicare IM given:    Discharge Disposition:  LONG TERM ACUTE CARE (LTAC)  Per UR Regulation:  Reviewed for med. necessity/level of care/duration of stay  If discussed at [place] of Stay Meetings, dates discussed:   10/17/2013  10/24/2013  10/31/2013    Comments:  10/31/2013 [place], [place] [facility]  called with room information Room: 213 Dr Manson Allan Report to: [phone]  ext  [phone] transport after 6:00 pm  Sherlynn Stalls [place]/spouse called with room assignment and time of transfer  10/31/2013  [place], [place] [phone] [facility] called to advise can admit patient today, will call with bed number and contact information for report Dr Tyrell Antonio advised of [facility] bed available today. Charge nurse advised of [facility] bed. Sherlynn Stalls [place]/ spouse called to advise of transfer to [facility] today  10/30/2013  [place], [place] [phone] [facility] [phone] spoke with Pamala Hurry regarding results of Hep B antigen, she will enter results in epic and fax to [facility]  Call to [facility] [phone] called to advise results received for Hep B, asked status of HD beds. She requested fax of H&P, MAR, HD orders, progress  notes x 3 days.  FAXED to [phone]   10/28/2013 [place], [place] [phone] [facility]: NCM called to update her on status of HepB antigen lab, results still pending. Advised her the patient is scheduled for HD today.  She noted it is possible for a bed on the weekend. FAX number to [facility]: [phone] Hep B antigen results when completed.  10/23/2013  [place], [place] scheduled for PEG placement today  10/23/2013  [place], [place] [phone] [facility] reviewed if a candidate, not on IV antibiotics, continues with HD and tube feeds, at baseline  10/09/2013  [place], [place] Plan for stump revision on 10/11/2013

## 2013-10-31 NOTE — Progress Notes (Signed)
   CARE MANAGEMENT NOTE 10/31/2013  Patient:  Oscar Castillo, Oscar Castillo   Account Number:  [id number]  Date Initiated:  10/03/2013  Documentation initiated by:  MAYO,HENRIETTA  Subjective/Objective Assessment:   dx AMS  At [facility] SNF for rehab    PCP  Dr [name]     Action/Plan:   discharge to [facility]   Anticipated DC Date:  10/31/2013   Anticipated DC Plan:  LONG TERM ACUTE CARE (LTAC)  In-house referral  Clinical Social Worker      DC Planning Services  CM consult      Choice offered to / List presented to:             Status of service:  Completed, signed off Medicare Important Message given?   (If response is "NO", the following Medicare IM given date fields will be blank) Date Medicare IM given:   Date Additional Medicare IM given:    Discharge Disposition:  LONG TERM ACUTE CARE (LTAC)  Per UR Regulation:  Reviewed for med. necessity/level of care/duration of stay  If discussed at [place] of Stay Meetings, dates discussed:   10/17/2013  10/24/2013  10/31/2013    Comments:  10/31/2013  [place], [place] [phone] [facility] called to advise can admit patient today, will call with bed number and contact information for report Dr Tyrell Antonio advised of [facility] bed available today. Charge nurse advised of [facility] bed. Sherlynn Stalls [place]/ spouse called to advise of transfer to [facility] today  10/30/2013  [place], [place] [phone] [facility] [phone] spoke with Pamala Hurry regarding results of Hep B antigen, she will enter results in epic and fax to [facility]  Call to [facility] [phone] called to advise results received for Hep B, asked status of HD beds. She requested fax of H&P, MAR, HD orders, progress notes x 3 days.  FAXED to [phone]   10/28/2013 [place], [place] [phone] [facility]: NCM called to update her on status of HepB antigen lab, results still pending. Advised her the patient is scheduled for HD today.  She noted it  is possible for a bed on the weekend. FAX number to [facility]: [phone] Hep B antigen results when completed.  10/23/2013  [place], [place] scheduled for PEG placement today  10/23/2013  [place], [place] [phone] [facility] reviewed if a candidate, not on IV antibiotics, continues with HD and tube feeds, at baseline  10/09/2013  [place], [place] Plan for stump revision on 10/11/2013

## 2013-11-29 ENCOUNTER — Ambulatory Visit: Payer: Medicare Other | Admitting: Internal Medicine

## 2013-12-02 ENCOUNTER — Inpatient Hospital Stay: Admit: 2013-12-02 | Discharge: 2013-12-02 | Discharge status: Against Medical Advice

## 2013-12-13 ENCOUNTER — Other Ambulatory Visit (HOSPITAL_COMMUNITY): Payer: Self-pay | Admitting: Internal Medicine

## 2013-12-13 DIAGNOSIS — T82858A Stenosis of vascular prosthetic devices, implants and grafts, initial encounter: Secondary | ICD-10-CM

## 2013-12-13 DIAGNOSIS — N186 End stage renal disease: Secondary | ICD-10-CM | POA: Diagnosis not present

## 2013-12-13 DIAGNOSIS — E785 Hyperlipidemia, unspecified: Secondary | ICD-10-CM | POA: Diagnosis not present

## 2013-12-13 DIAGNOSIS — E109 Type 1 diabetes mellitus without complications: Secondary | ICD-10-CM | POA: Diagnosis not present

## 2013-12-13 DIAGNOSIS — I10 Essential (primary) hypertension: Secondary | ICD-10-CM | POA: Diagnosis not present

## 2013-12-13 DIAGNOSIS — T82898A Other specified complication of vascular prosthetic devices, implants and grafts, initial encounter: Secondary | ICD-10-CM

## 2013-12-14 ENCOUNTER — Encounter (HOSPITAL_COMMUNITY): Payer: Self-pay

## 2013-12-14 ENCOUNTER — Other Ambulatory Visit (HOSPITAL_COMMUNITY): Payer: Self-pay | Admitting: Internal Medicine

## 2013-12-14 ENCOUNTER — Ambulatory Visit (HOSPITAL_COMMUNITY)
Admission: RE | Admit: 2013-12-14 | Discharge: 2013-12-14 | Disposition: A | Discharge status: Home / Self Care | Payer: Medicare Other | Source: Ambulatory Visit | Attending: Internal Medicine | Admitting: Internal Medicine

## 2013-12-14 DIAGNOSIS — I120 Hypertensive chronic kidney disease with stage 5 chronic kidney disease or end stage renal disease: Secondary | ICD-10-CM | POA: Insufficient documentation

## 2013-12-14 DIAGNOSIS — T82898A Other specified complication of vascular prosthetic devices, implants and grafts, initial encounter: Secondary | ICD-10-CM | POA: Insufficient documentation

## 2013-12-14 DIAGNOSIS — I871 Compression of vein: Secondary | ICD-10-CM | POA: Insufficient documentation

## 2013-12-14 DIAGNOSIS — E1129 Type 2 diabetes mellitus with other diabetic kidney complication: Secondary | ICD-10-CM | POA: Insufficient documentation

## 2013-12-14 DIAGNOSIS — F3289 Other specified depressive episodes: Secondary | ICD-10-CM | POA: Insufficient documentation

## 2013-12-14 DIAGNOSIS — S88119A Complete traumatic amputation at level between knee and ankle, unspecified lower leg, initial encounter: Secondary | ICD-10-CM | POA: Insufficient documentation

## 2013-12-14 DIAGNOSIS — Y832 Surgical operation with anastomosis, bypass or graft as the cause of abnormal reaction of the patient, or of later complication, without mention of misadventure at the time of the procedure: Secondary | ICD-10-CM | POA: Insufficient documentation

## 2013-12-14 DIAGNOSIS — I509 Heart failure, unspecified: Secondary | ICD-10-CM | POA: Diagnosis not present

## 2013-12-14 DIAGNOSIS — F329 Major depressive disorder, single episode, unspecified: Secondary | ICD-10-CM | POA: Insufficient documentation

## 2013-12-14 DIAGNOSIS — K219 Gastro-esophageal reflux disease without esophagitis: Secondary | ICD-10-CM | POA: Insufficient documentation

## 2013-12-14 DIAGNOSIS — Z8673 Personal history of transient ischemic attack (TIA), and cerebral infarction without residual deficits: Secondary | ICD-10-CM | POA: Insufficient documentation

## 2013-12-14 DIAGNOSIS — N186 End stage renal disease: Secondary | ICD-10-CM | POA: Diagnosis not present

## 2013-12-14 DIAGNOSIS — Z905 Acquired absence of kidney: Secondary | ICD-10-CM | POA: Insufficient documentation

## 2013-12-14 DIAGNOSIS — Z992 Dependence on renal dialysis: Secondary | ICD-10-CM | POA: Insufficient documentation

## 2013-12-14 MED ORDER — IOHEXOL 300 MG/ML  SOLN
100.0000 mL | Freq: Once | INTRAMUSCULAR | Status: AC | PRN
  Administered 2013-12-14: 50 mL via INTRAVENOUS

## 2013-12-14 NOTE — Procedures (Signed)
LUE venous PTA 53mm No comp

## 2013-12-14 NOTE — H&P (Signed)
Chief Complaint: "Fistula not working." Referring Physician: Dr. Dimas Aguas HPI: Oscar Castillo is an 63 y.o. male with ESRD on HD with LUE AVF T/T/S. IR received request for image guided LUE fistulogram with possible intervention given decreased access flow. The patient is unable to provide history and his wife presents today with him. She denies any known allergies or complications to sedation in past.   Past Medical History:  Past Medical History  Diagnosis Date  . ESRD on hemodialysis 05/05/2007    ESRD due to DM/HTN. Started dialysis in November 2013.  HD TTS at [facility] on [place].  [name] BACK PAIN, LUMBAR, CHRONIC 08/06/2009  . BENIGN PROSTATIC HYPERTROPHY 08/01/2010  . CEREBROVASCULAR ACCIDENT, HX OF 08/06/2009  . CHOLELITHIASIS 08/01/2010  . CONGESTIVE HEART FAILURE 03/18/2009  . DEPRESSION 03/18/2009  . DIABETES MELLITUS, TYPE II 03/25/2007  . ERECTILE DYSFUNCTION 03/25/2007  . GERD 03/25/2007  . HEPATITIS C, HX OF 03/25/2007  . HYPERTENSION 03/25/2007  . Morbid obesity 03/25/2007  . NEPHROLITHIASIS, HX OF 03/18/2009  . Complication of anesthesia     wife states pt had trouble waking up with his last surgery in Nov., 2014    Past Surgical History:  Past Surgical History  Procedure Laterality Date  . Nephrectomy      partial RR  . Av fistula placement  06/14/2012    Procedure: ARTERIOVENOUS (AV) FISTULA CREATION;  Surgeon: Angelia Mould, MD;  Location: [facility] OR;  Service: Vascular;  Laterality: Left;  Left basilic vein transposition with fistula.  . Tibia im nail insertion Left 09/09/2012    Procedure: INTRAMEDULLARY (IM) NAIL TIBIAL;  Surgeon: Johnny Bridge, MD;  Location: [place];  Service: Orthopedics;  Laterality: Left;  left tibial nail and open reduction internal fixation left fibula fracture  . Orif fibula fracture Left 09/09/2012    Procedure: OPEN REDUCTION INTERNAL FIXATION (ORIF) FIBULA FRACTURE;  Surgeon: Johnny Bridge, MD;  Location: [place];  Service: Orthopedics;   Laterality: Left;  . Colonoscopy N/A 10/28/2012    Procedure: COLONOSCOPY;  Surgeon: Jeryl Columbia, MD;  Location: [facility] ENDOSCOPY;  Service: Endoscopy;  Laterality: N/A;  . Esophagogastroduodenoscopy N/A 11/02/2012    Procedure: ESOPHAGOGASTRODUODENOSCOPY (EGD);  Surgeon: Cleotis Nipper, MD;  Location: [facility] ENDOSCOPY;  Service: Endoscopy;  Laterality: N/A;  . Colonoscopy N/A 11/02/2012    Procedure: COLONOSCOPY;  Surgeon: Cleotis Nipper, MD;  Location: [facility] ENDOSCOPY;  Service: Endoscopy;  Laterality: N/A;  . Colonoscopy N/A 11/03/2012    Procedure: COLONOSCOPY;  Surgeon: Cleotis Nipper, MD;  Location: [facility] ENDOSCOPY;  Service: Endoscopy;  Laterality: N/A;  . Givens capsule study N/A 11/04/2012    Procedure: GIVENS CAPSULE STUDY;  Surgeon: Cleotis Nipper, MD;  Location: [facility] ENDOSCOPY;  Service: Endoscopy;  Laterality: N/A;  . Enteroscopy N/A 11/08/2012    Procedure: ENTEROSCOPY;  Surgeon: Wonda Horner, MD;  Location: [facility] ENDOSCOPY;  Service: Endoscopy;  Laterality: N/A;  . Amputation Left 05/12/2013    Procedure: AMPUTATION RAY;  Surgeon: Newt Minion, MD;  Location: [place];  Service: Orthopedics;  Laterality: Left;  Left Foot 1st Ray Amputation  . Eye surgery Left     to remove scar tissue  . Amputation Left 06/09/2013    Procedure: AMPUTATION BELOW KNEE;  Surgeon: Newt Minion, MD;  Location: [place];  Service: Orthopedics;  Laterality: Left;  Left Below Knee Amputation and removal proximal screws IM tibial nail  . Hardware removal Left 06/09/2013    Procedure: HARDWARE  REMOVAL;  Surgeon: Newt Minion, MD;  Location: [place];  Service: Orthopedics;  Laterality: Left;  Left Below Knee Amputation  and Removal proximal screws IM tibial nail  . Amputation Right 09/08/2013    Procedure: AMPUTATION BELOW KNEE;  Surgeon: Newt Minion, MD;  Location: [place];  Service: Orthopedics;  Laterality: Right;  Right Below Knee Amputation  . Amputation Right 10/11/2013    Procedure: AMPUTATION BELOW KNEE;  Surgeon: Newt Minion, MD;  Location: [place];  Service: Orthopedics;  Laterality: Right;  Right Below Knee Amputation Revision    Family History:  Family History  Problem Relation Age of Onset  . Coronary artery disease Other   . Diabetes Other     Social History:  reports that he has never smoked. He has never used smokeless tobacco. He reports that he does not drink alcohol or use illicit drugs.  Allergies: No Known Allergies  Medications:   Medication List    ASK your doctor about these medications       acetaminophen 500 MG tablet  Commonly known as:  TYLENOL  Take 500 mg by mouth every 6 (six) hours as needed for mild pain.     amLODipine 10 MG tablet  Commonly known as:  NORVASC  Take 10 mg by mouth daily.     bisacodyl 5 MG EC tablet  Commonly known as:  DULCOLAX  Take 1 tablet (5 mg total) by mouth 2 (two) times daily.     brimonidine 0.2 % ophthalmic solution  Commonly known as:  ALPHAGAN  Place 1 drop into both eyes 2 (two) times daily.     calcium acetate 667 MG capsule  Commonly known as:  PHOSLO  Take 2 capsules (1,334 mg total) by mouth 3 (three) times daily with meals.     feeding supplement (NEPRO CARB STEADY) Liqd  Place 1,000 mLs into feeding tube continuous.     feeding supplement (PRO-STAT SUGAR FREE 64) Liqd  Place 30 mLs into feeding tube daily at 12 noon.     FLUoxetine 20 MG/5ML solution  Commonly known as:  PROZAC  Place 5 mLs (20 mg total) into feeding tube daily.     free water Soln  Place 175 mLs into feeding tube 4 (four) times daily.     gabapentin 300 MG capsule  Commonly known as:  NEURONTIN  Take 300 mg by mouth daily as needed (for mild to moderate pain).     haloperidol 5 MG tablet  Commonly known as:  HALDOL  Take 5 mg by mouth at bedtime as needed for agitation.     labetalol 300 MG tablet  Commonly known as:  NORMODYNE  Take 1 tablet (300 mg total) by mouth 3 (three) times daily.     multivitamin Tabs tablet  Take 1 tablet by  mouth at bedtime.     neomycin-bacitracin-polymyxin Oint  Commonly known as:  NEOSPORIN  Apply 1 application topically 3 (three) times daily.     saccharomyces boulardii 250 MG capsule  Commonly known as:  FLORASTOR  Take 250 mg by mouth 2 (two) times daily.     sertraline 100 MG tablet  Commonly known as:  ZOLOFT  Take 100 mg by mouth daily as needed. For depression     silver sulfADIAZINE 1 % cream  Commonly known as:  SILVADENE  Apply 1 application topically daily.     SIMBRINZA 1-0.2 % Susp  Generic drug:  Brinzolamide-Brimonidine  Place 1 drop into  both eyes 2 (two) times daily.     Travoprost (BAK Free) 0.004 % Soln ophthalmic solution  Commonly known as:  TRAVATAN  Place 1 drop into both eyes at bedtime.     traZODone 50 MG tablet  Commonly known as:  DESYREL  Take 50 mg by mouth at bedtime as needed for sleep.     VITAMIN C PO  Take 1 tablet by mouth daily.       Patient unable to provide history, history obtained by wife.   Physical Exam: BP 97/73  Pulse 77  Temp(Src) 98.5 F (36.9 C) (Oral)  Wt 178 lb (80.74 kg)  SpO2 100% Body mass index is 39.93 kg/(m^2).  General Appearance:  Alert, cooperative, no distress  Head:  Normocephalic, without obvious abnormality, atraumatic  Neck: Supple, symmetrical, trachea midline  Lungs:   Clear to auscultation bilaterally, no w/r/r, respirations unlabored without use of accessory muscles.  Chest Wall:  No tenderness or deformity  Heart:  Regular rate and rhythm, S1, S2 normal, no murmur, rub or gallop.  Abdomen:   Soft, non-tender, non distended, (+) BS  Extremities: LUE AVF no bruit  Neurologic: Normal affect, no gross deficits.   No results found for this or any previous visit (from the past 48 hour(s)). No results found.  Assessment/Plan ESRD on HD with LUE avf Decreased access flow  Request for image guided fistulogram with possible intervention/HD catheter placement with moderate sedation. Patient has  been NPO, no blood thinners Risks and Benefits discussed with the patient's wife. All questions were answered, patient's wife is agreeable to proceed. Consent signed and in chart.   Hedy Jacob PA-C 12/14/2013, 9:00 AM

## 2013-12-25 DIAGNOSIS — I10 Essential (primary) hypertension: Secondary | ICD-10-CM | POA: Diagnosis not present

## 2013-12-25 DIAGNOSIS — D649 Anemia, unspecified: Secondary | ICD-10-CM | POA: Diagnosis not present

## 2013-12-25 DIAGNOSIS — N186 End stage renal disease: Secondary | ICD-10-CM | POA: Diagnosis not present

## 2014-01-17 DIAGNOSIS — F0150 Vascular dementia without behavioral disturbance: Secondary | ICD-10-CM | POA: Diagnosis not present

## 2014-01-17 DIAGNOSIS — R262 Difficulty in walking, not elsewhere classified: Secondary | ICD-10-CM | POA: Diagnosis not present

## 2014-01-17 DIAGNOSIS — I10 Essential (primary) hypertension: Secondary | ICD-10-CM | POA: Diagnosis not present

## 2014-01-17 DIAGNOSIS — B1920 Unspecified viral hepatitis C without hepatic coma: Secondary | ICD-10-CM | POA: Diagnosis present

## 2014-01-17 DIAGNOSIS — N529 Male erectile dysfunction, unspecified: Secondary | ICD-10-CM | POA: Diagnosis present

## 2014-01-17 DIAGNOSIS — M6281 Muscle weakness (generalized): Secondary | ICD-10-CM | POA: Diagnosis not present

## 2014-01-17 DIAGNOSIS — N039 Chronic nephritic syndrome with unspecified morphologic changes: Secondary | ICD-10-CM | POA: Diagnosis not present

## 2014-01-17 DIAGNOSIS — L8994 Pressure ulcer of unspecified site, stage 4: Secondary | ICD-10-CM | POA: Diagnosis not present

## 2014-01-17 DIAGNOSIS — E109 Type 1 diabetes mellitus without complications: Secondary | ICD-10-CM | POA: Diagnosis not present

## 2014-01-17 DIAGNOSIS — I120 Hypertensive chronic kidney disease with stage 5 chronic kidney disease or end stage renal disease: Secondary | ICD-10-CM | POA: Diagnosis not present

## 2014-01-17 DIAGNOSIS — N189 Chronic kidney disease, unspecified: Secondary | ICD-10-CM | POA: Diagnosis not present

## 2014-01-17 DIAGNOSIS — R4182 Altered mental status, unspecified: Secondary | ICD-10-CM | POA: Diagnosis not present

## 2014-01-17 DIAGNOSIS — N19 Unspecified kidney failure: Secondary | ICD-10-CM | POA: Diagnosis not present

## 2014-01-17 DIAGNOSIS — I509 Heart failure, unspecified: Secondary | ICD-10-CM | POA: Diagnosis not present

## 2014-01-17 DIAGNOSIS — E1129 Type 2 diabetes mellitus with other diabetic kidney complication: Secondary | ICD-10-CM | POA: Diagnosis not present

## 2014-01-17 DIAGNOSIS — Z1159 Encounter for screening for other viral diseases: Secondary | ICD-10-CM | POA: Diagnosis not present

## 2014-01-17 DIAGNOSIS — Z8673 Personal history of transient ischemic attack (TIA), and cerebral infarction without residual deficits: Secondary | ICD-10-CM | POA: Diagnosis not present

## 2014-01-17 DIAGNOSIS — R1311 Dysphagia, oral phase: Secondary | ICD-10-CM | POA: Diagnosis not present

## 2014-01-17 DIAGNOSIS — N186 End stage renal disease: Secondary | ICD-10-CM | POA: Diagnosis not present

## 2014-01-17 DIAGNOSIS — F3289 Other specified depressive episodes: Secondary | ICD-10-CM | POA: Diagnosis present

## 2014-01-17 DIAGNOSIS — F0390 Unspecified dementia without behavioral disturbance: Secondary | ICD-10-CM | POA: Diagnosis present

## 2014-01-17 DIAGNOSIS — R0789 Other chest pain: Secondary | ICD-10-CM | POA: Diagnosis not present

## 2014-01-17 DIAGNOSIS — T82598A Other mechanical complication of other cardiac and vascular devices and implants, initial encounter: Secondary | ICD-10-CM | POA: Diagnosis present

## 2014-01-17 DIAGNOSIS — I6990 Unspecified sequelae of unspecified cerebrovascular disease: Secondary | ICD-10-CM | POA: Diagnosis not present

## 2014-01-17 DIAGNOSIS — M869 Osteomyelitis, unspecified: Secondary | ICD-10-CM | POA: Diagnosis not present

## 2014-01-17 DIAGNOSIS — S88119A Complete traumatic amputation at level between knee and ankle, unspecified lower leg, initial encounter: Secondary | ICD-10-CM | POA: Diagnosis not present

## 2014-01-17 DIAGNOSIS — R41841 Cognitive communication deficit: Secondary | ICD-10-CM | POA: Diagnosis not present

## 2014-01-17 DIAGNOSIS — L8995 Pressure ulcer of unspecified site, unstageable: Secondary | ICD-10-CM | POA: Diagnosis not present

## 2014-01-17 DIAGNOSIS — I6789 Other cerebrovascular disease: Secondary | ICD-10-CM | POA: Diagnosis not present

## 2014-01-17 DIAGNOSIS — G609 Hereditary and idiopathic neuropathy, unspecified: Secondary | ICD-10-CM | POA: Diagnosis not present

## 2014-01-17 DIAGNOSIS — F329 Major depressive disorder, single episode, unspecified: Secondary | ICD-10-CM | POA: Diagnosis present

## 2014-01-17 DIAGNOSIS — I2510 Atherosclerotic heart disease of native coronary artery without angina pectoris: Secondary | ICD-10-CM | POA: Diagnosis present

## 2014-01-17 DIAGNOSIS — A0472 Enterocolitis due to Clostridium difficile, not specified as recurrent: Secondary | ICD-10-CM | POA: Diagnosis not present

## 2014-01-17 DIAGNOSIS — I5032 Chronic diastolic (congestive) heart failure: Secondary | ICD-10-CM | POA: Diagnosis present

## 2014-01-17 DIAGNOSIS — M549 Dorsalgia, unspecified: Secondary | ICD-10-CM | POA: Diagnosis present

## 2014-01-17 DIAGNOSIS — N179 Acute kidney failure, unspecified: Secondary | ICD-10-CM | POA: Diagnosis not present

## 2014-01-17 DIAGNOSIS — E119 Type 2 diabetes mellitus without complications: Secondary | ICD-10-CM | POA: Diagnosis not present

## 2014-01-17 DIAGNOSIS — R079 Chest pain, unspecified: Secondary | ICD-10-CM | POA: Diagnosis not present

## 2014-01-17 DIAGNOSIS — G9341 Metabolic encephalopathy: Secondary | ICD-10-CM | POA: Diagnosis present

## 2014-01-17 DIAGNOSIS — L89109 Pressure ulcer of unspecified part of back, unspecified stage: Secondary | ICD-10-CM | POA: Diagnosis present

## 2014-01-17 DIAGNOSIS — D509 Iron deficiency anemia, unspecified: Secondary | ICD-10-CM | POA: Diagnosis present

## 2014-01-17 DIAGNOSIS — N2581 Secondary hyperparathyroidism of renal origin: Secondary | ICD-10-CM | POA: Diagnosis not present

## 2014-01-17 DIAGNOSIS — G8929 Other chronic pain: Secondary | ICD-10-CM | POA: Diagnosis present

## 2014-01-17 DIAGNOSIS — I69998 Other sequelae following unspecified cerebrovascular disease: Secondary | ICD-10-CM | POA: Diagnosis not present

## 2014-01-17 DIAGNOSIS — R5381 Other malaise: Secondary | ICD-10-CM | POA: Diagnosis not present

## 2014-01-17 DIAGNOSIS — I739 Peripheral vascular disease, unspecified: Secondary | ICD-10-CM | POA: Diagnosis not present

## 2014-01-17 DIAGNOSIS — D631 Anemia in chronic kidney disease: Secondary | ICD-10-CM | POA: Diagnosis present

## 2014-01-17 DIAGNOSIS — R279 Unspecified lack of coordination: Secondary | ICD-10-CM | POA: Diagnosis not present

## 2014-01-17 DIAGNOSIS — E213 Hyperparathyroidism, unspecified: Secondary | ICD-10-CM | POA: Diagnosis present

## 2014-01-17 DIAGNOSIS — R002 Palpitations: Secondary | ICD-10-CM | POA: Diagnosis not present

## 2014-01-17 DIAGNOSIS — R6889 Other general symptoms and signs: Secondary | ICD-10-CM | POA: Diagnosis not present

## 2014-01-17 DIAGNOSIS — M25559 Pain in unspecified hip: Secondary | ICD-10-CM | POA: Diagnosis not present

## 2014-01-17 DIAGNOSIS — D649 Anemia, unspecified: Secondary | ICD-10-CM | POA: Diagnosis not present

## 2014-01-17 DIAGNOSIS — R627 Adult failure to thrive: Secondary | ICD-10-CM | POA: Diagnosis present

## 2014-01-17 DIAGNOSIS — R509 Fever, unspecified: Secondary | ICD-10-CM | POA: Diagnosis not present

## 2014-01-17 DIAGNOSIS — Z23 Encounter for immunization: Secondary | ICD-10-CM | POA: Diagnosis not present

## 2014-01-19 DIAGNOSIS — N186 End stage renal disease: Secondary | ICD-10-CM | POA: Diagnosis not present

## 2014-01-19 DIAGNOSIS — I10 Essential (primary) hypertension: Secondary | ICD-10-CM | POA: Diagnosis not present

## 2014-01-19 DIAGNOSIS — N2581 Secondary hyperparathyroidism of renal origin: Secondary | ICD-10-CM | POA: Diagnosis not present

## 2014-01-19 DIAGNOSIS — N039 Chronic nephritic syndrome with unspecified morphologic changes: Secondary | ICD-10-CM | POA: Diagnosis not present

## 2014-01-19 DIAGNOSIS — D631 Anemia in chronic kidney disease: Secondary | ICD-10-CM | POA: Diagnosis not present

## 2014-01-22 DIAGNOSIS — I509 Heart failure, unspecified: Secondary | ICD-10-CM | POA: Diagnosis not present

## 2014-01-22 DIAGNOSIS — R5381 Other malaise: Secondary | ICD-10-CM | POA: Diagnosis not present

## 2014-01-22 DIAGNOSIS — I10 Essential (primary) hypertension: Secondary | ICD-10-CM | POA: Diagnosis not present

## 2014-01-22 DIAGNOSIS — D631 Anemia in chronic kidney disease: Secondary | ICD-10-CM | POA: Diagnosis not present

## 2014-01-22 DIAGNOSIS — G609 Hereditary and idiopathic neuropathy, unspecified: Secondary | ICD-10-CM | POA: Diagnosis not present

## 2014-01-22 DIAGNOSIS — L8994 Pressure ulcer of unspecified site, stage 4: Secondary | ICD-10-CM | POA: Diagnosis not present

## 2014-01-22 DIAGNOSIS — N2581 Secondary hyperparathyroidism of renal origin: Secondary | ICD-10-CM | POA: Diagnosis not present

## 2014-01-22 DIAGNOSIS — E119 Type 2 diabetes mellitus without complications: Secondary | ICD-10-CM | POA: Diagnosis not present

## 2014-01-22 DIAGNOSIS — N186 End stage renal disease: Secondary | ICD-10-CM | POA: Diagnosis not present

## 2014-01-22 DIAGNOSIS — I739 Peripheral vascular disease, unspecified: Secondary | ICD-10-CM | POA: Diagnosis not present

## 2014-01-24 DIAGNOSIS — D649 Anemia, unspecified: Secondary | ICD-10-CM | POA: Diagnosis not present

## 2014-01-24 DIAGNOSIS — I10 Essential (primary) hypertension: Secondary | ICD-10-CM | POA: Diagnosis not present

## 2014-01-24 DIAGNOSIS — N186 End stage renal disease: Secondary | ICD-10-CM | POA: Diagnosis not present

## 2014-02-02 DIAGNOSIS — I5032 Chronic diastolic (congestive) heart failure: Secondary | ICD-10-CM | POA: Diagnosis present

## 2014-02-02 DIAGNOSIS — M549 Dorsalgia, unspecified: Secondary | ICD-10-CM | POA: Diagnosis present

## 2014-02-02 DIAGNOSIS — M869 Osteomyelitis, unspecified: Secondary | ICD-10-CM | POA: Diagnosis not present

## 2014-02-02 DIAGNOSIS — G9341 Metabolic encephalopathy: Secondary | ICD-10-CM | POA: Diagnosis not present

## 2014-02-02 DIAGNOSIS — R4182 Altered mental status, unspecified: Secondary | ICD-10-CM | POA: Diagnosis not present

## 2014-02-02 DIAGNOSIS — I10 Essential (primary) hypertension: Secondary | ICD-10-CM | POA: Diagnosis not present

## 2014-02-02 DIAGNOSIS — I2510 Atherosclerotic heart disease of native coronary artery without angina pectoris: Secondary | ICD-10-CM | POA: Diagnosis present

## 2014-02-02 DIAGNOSIS — N189 Chronic kidney disease, unspecified: Secondary | ICD-10-CM | POA: Diagnosis not present

## 2014-02-02 DIAGNOSIS — B1920 Unspecified viral hepatitis C without hepatic coma: Secondary | ICD-10-CM | POA: Diagnosis present

## 2014-02-02 DIAGNOSIS — E213 Hyperparathyroidism, unspecified: Secondary | ICD-10-CM | POA: Diagnosis present

## 2014-02-02 DIAGNOSIS — R509 Fever, unspecified: Secondary | ICD-10-CM | POA: Diagnosis not present

## 2014-02-02 DIAGNOSIS — R51 Headache: Secondary | ICD-10-CM | POA: Diagnosis not present

## 2014-02-02 DIAGNOSIS — R1311 Dysphagia, oral phase: Secondary | ICD-10-CM | POA: Diagnosis not present

## 2014-02-02 DIAGNOSIS — R079 Chest pain, unspecified: Secondary | ICD-10-CM | POA: Diagnosis not present

## 2014-02-02 DIAGNOSIS — F0390 Unspecified dementia without behavioral disturbance: Secondary | ICD-10-CM | POA: Diagnosis present

## 2014-02-02 DIAGNOSIS — A09 Infectious gastroenteritis and colitis, unspecified: Secondary | ICD-10-CM | POA: Diagnosis not present

## 2014-02-02 DIAGNOSIS — F3289 Other specified depressive episodes: Secondary | ICD-10-CM | POA: Diagnosis present

## 2014-02-02 DIAGNOSIS — M6281 Muscle weakness (generalized): Secondary | ICD-10-CM | POA: Diagnosis not present

## 2014-02-02 DIAGNOSIS — R0789 Other chest pain: Secondary | ICD-10-CM | POA: Diagnosis not present

## 2014-02-02 DIAGNOSIS — D509 Iron deficiency anemia, unspecified: Secondary | ICD-10-CM | POA: Diagnosis present

## 2014-02-02 DIAGNOSIS — L89109 Pressure ulcer of unspecified part of back, unspecified stage: Secondary | ICD-10-CM | POA: Diagnosis not present

## 2014-02-02 DIAGNOSIS — R262 Difficulty in walking, not elsewhere classified: Secondary | ICD-10-CM | POA: Diagnosis not present

## 2014-02-02 DIAGNOSIS — I6990 Unspecified sequelae of unspecified cerebrovascular disease: Secondary | ICD-10-CM | POA: Diagnosis not present

## 2014-02-02 DIAGNOSIS — N186 End stage renal disease: Secondary | ICD-10-CM | POA: Diagnosis not present

## 2014-02-02 DIAGNOSIS — I120 Hypertensive chronic kidney disease with stage 5 chronic kidney disease or end stage renal disease: Secondary | ICD-10-CM | POA: Diagnosis not present

## 2014-02-02 DIAGNOSIS — F0150 Vascular dementia without behavioral disturbance: Secondary | ICD-10-CM | POA: Diagnosis not present

## 2014-02-02 DIAGNOSIS — R6889 Other general symptoms and signs: Secondary | ICD-10-CM | POA: Diagnosis not present

## 2014-02-02 DIAGNOSIS — N19 Unspecified kidney failure: Secondary | ICD-10-CM | POA: Diagnosis not present

## 2014-02-02 DIAGNOSIS — F329 Major depressive disorder, single episode, unspecified: Secondary | ICD-10-CM | POA: Diagnosis present

## 2014-02-02 DIAGNOSIS — A0472 Enterocolitis due to Clostridium difficile, not specified as recurrent: Secondary | ICD-10-CM | POA: Diagnosis not present

## 2014-02-02 DIAGNOSIS — R279 Unspecified lack of coordination: Secondary | ICD-10-CM | POA: Diagnosis not present

## 2014-02-02 DIAGNOSIS — I739 Peripheral vascular disease, unspecified: Secondary | ICD-10-CM | POA: Diagnosis present

## 2014-02-02 DIAGNOSIS — I69998 Other sequelae following unspecified cerebrovascular disease: Secondary | ICD-10-CM | POA: Diagnosis not present

## 2014-02-02 DIAGNOSIS — Z8673 Personal history of transient ischemic attack (TIA), and cerebral infarction without residual deficits: Secondary | ICD-10-CM | POA: Diagnosis not present

## 2014-02-02 DIAGNOSIS — D631 Anemia in chronic kidney disease: Secondary | ICD-10-CM | POA: Diagnosis present

## 2014-02-02 DIAGNOSIS — T82598A Other mechanical complication of other cardiac and vascular devices and implants, initial encounter: Secondary | ICD-10-CM | POA: Diagnosis not present

## 2014-02-02 DIAGNOSIS — E119 Type 2 diabetes mellitus without complications: Secondary | ICD-10-CM | POA: Diagnosis present

## 2014-02-02 DIAGNOSIS — R41841 Cognitive communication deficit: Secondary | ICD-10-CM | POA: Diagnosis not present

## 2014-02-02 DIAGNOSIS — D649 Anemia, unspecified: Secondary | ICD-10-CM | POA: Diagnosis not present

## 2014-02-02 DIAGNOSIS — N289 Disorder of kidney and ureter, unspecified: Secondary | ICD-10-CM | POA: Diagnosis not present

## 2014-02-02 DIAGNOSIS — A419 Sepsis, unspecified organism: Secondary | ICD-10-CM | POA: Diagnosis not present

## 2014-02-02 DIAGNOSIS — I509 Heart failure, unspecified: Secondary | ICD-10-CM | POA: Diagnosis present

## 2014-02-02 DIAGNOSIS — R627 Adult failure to thrive: Secondary | ICD-10-CM | POA: Diagnosis present

## 2014-02-02 DIAGNOSIS — S88119A Complete traumatic amputation at level between knee and ankle, unspecified lower leg, initial encounter: Secondary | ICD-10-CM | POA: Diagnosis not present

## 2014-02-02 DIAGNOSIS — L8995 Pressure ulcer of unspecified site, unstageable: Secondary | ICD-10-CM | POA: Diagnosis not present

## 2014-02-02 DIAGNOSIS — G8929 Other chronic pain: Secondary | ICD-10-CM | POA: Diagnosis present

## 2014-02-02 DIAGNOSIS — N529 Male erectile dysfunction, unspecified: Secondary | ICD-10-CM | POA: Diagnosis present

## 2014-02-02 DIAGNOSIS — L8994 Pressure ulcer of unspecified site, stage 4: Secondary | ICD-10-CM | POA: Diagnosis not present

## 2014-02-02 DIAGNOSIS — T82898A Other specified complication of vascular prosthetic devices, implants and grafts, initial encounter: Secondary | ICD-10-CM | POA: Diagnosis not present

## 2014-02-09 DIAGNOSIS — M869 Osteomyelitis, unspecified: Secondary | ICD-10-CM | POA: Diagnosis not present

## 2014-02-09 DIAGNOSIS — G8929 Other chronic pain: Secondary | ICD-10-CM | POA: Diagnosis not present

## 2014-02-09 DIAGNOSIS — G609 Hereditary and idiopathic neuropathy, unspecified: Secondary | ICD-10-CM | POA: Diagnosis not present

## 2014-02-09 DIAGNOSIS — E1129 Type 2 diabetes mellitus with other diabetic kidney complication: Secondary | ICD-10-CM | POA: Diagnosis not present

## 2014-02-09 DIAGNOSIS — G9341 Metabolic encephalopathy: Secondary | ICD-10-CM | POA: Diagnosis not present

## 2014-02-09 DIAGNOSIS — A419 Sepsis, unspecified organism: Secondary | ICD-10-CM | POA: Diagnosis not present

## 2014-02-09 DIAGNOSIS — L8994 Pressure ulcer of unspecified site, stage 4: Secondary | ICD-10-CM | POA: Diagnosis not present

## 2014-02-09 DIAGNOSIS — F329 Major depressive disorder, single episode, unspecified: Secondary | ICD-10-CM | POA: Diagnosis not present

## 2014-02-09 DIAGNOSIS — Z8673 Personal history of transient ischemic attack (TIA), and cerebral infarction without residual deficits: Secondary | ICD-10-CM | POA: Diagnosis not present

## 2014-02-09 DIAGNOSIS — I739 Peripheral vascular disease, unspecified: Secondary | ICD-10-CM | POA: Diagnosis not present

## 2014-02-09 DIAGNOSIS — I10 Essential (primary) hypertension: Secondary | ICD-10-CM | POA: Diagnosis not present

## 2014-02-09 DIAGNOSIS — L8995 Pressure ulcer of unspecified site, unstageable: Secondary | ICD-10-CM | POA: Diagnosis not present

## 2014-02-09 DIAGNOSIS — F0150 Vascular dementia without behavioral disturbance: Secondary | ICD-10-CM | POA: Diagnosis not present

## 2014-02-09 DIAGNOSIS — N039 Chronic nephritic syndrome with unspecified morphologic changes: Secondary | ICD-10-CM | POA: Diagnosis not present

## 2014-02-09 DIAGNOSIS — B162 Acute hepatitis B without delta-agent with hepatic coma: Secondary | ICD-10-CM | POA: Diagnosis not present

## 2014-02-09 DIAGNOSIS — F4322 Adjustment disorder with anxiety: Secondary | ICD-10-CM | POA: Diagnosis not present

## 2014-02-09 DIAGNOSIS — A0472 Enterocolitis due to Clostridium difficile, not specified as recurrent: Secondary | ICD-10-CM | POA: Diagnosis not present

## 2014-02-09 DIAGNOSIS — F3289 Other specified depressive episodes: Secondary | ICD-10-CM | POA: Diagnosis not present

## 2014-02-09 DIAGNOSIS — D649 Anemia, unspecified: Secondary | ICD-10-CM | POA: Diagnosis not present

## 2014-02-09 DIAGNOSIS — R1311 Dysphagia, oral phase: Secondary | ICD-10-CM | POA: Diagnosis not present

## 2014-02-09 DIAGNOSIS — N189 Chronic kidney disease, unspecified: Secondary | ICD-10-CM | POA: Diagnosis not present

## 2014-02-09 DIAGNOSIS — E119 Type 2 diabetes mellitus without complications: Secondary | ICD-10-CM | POA: Diagnosis not present

## 2014-02-09 DIAGNOSIS — L89109 Pressure ulcer of unspecified part of back, unspecified stage: Secondary | ICD-10-CM | POA: Diagnosis not present

## 2014-02-09 DIAGNOSIS — M545 Low back pain, unspecified: Secondary | ICD-10-CM | POA: Diagnosis not present

## 2014-02-09 DIAGNOSIS — M25559 Pain in unspecified hip: Secondary | ICD-10-CM | POA: Diagnosis not present

## 2014-02-09 DIAGNOSIS — I509 Heart failure, unspecified: Secondary | ICD-10-CM | POA: Diagnosis not present

## 2014-02-09 DIAGNOSIS — N186 End stage renal disease: Secondary | ICD-10-CM | POA: Diagnosis not present

## 2014-02-09 DIAGNOSIS — S88119A Complete traumatic amputation at level between knee and ankle, unspecified lower leg, initial encounter: Secondary | ICD-10-CM | POA: Diagnosis not present

## 2014-02-09 DIAGNOSIS — M6281 Muscle weakness (generalized): Secondary | ICD-10-CM | POA: Diagnosis not present

## 2014-02-09 DIAGNOSIS — D631 Anemia in chronic kidney disease: Secondary | ICD-10-CM | POA: Diagnosis not present

## 2014-02-09 DIAGNOSIS — D509 Iron deficiency anemia, unspecified: Secondary | ICD-10-CM | POA: Diagnosis not present

## 2014-02-09 DIAGNOSIS — N19 Unspecified kidney failure: Secondary | ICD-10-CM | POA: Diagnosis not present

## 2014-02-09 DIAGNOSIS — R41841 Cognitive communication deficit: Secondary | ICD-10-CM | POA: Diagnosis not present

## 2014-02-09 DIAGNOSIS — A09 Infectious gastroenteritis and colitis, unspecified: Secondary | ICD-10-CM | POA: Diagnosis not present

## 2014-02-09 DIAGNOSIS — T82898A Other specified complication of vascular prosthetic devices, implants and grafts, initial encounter: Secondary | ICD-10-CM | POA: Diagnosis not present

## 2014-02-09 DIAGNOSIS — N2581 Secondary hyperparathyroidism of renal origin: Secondary | ICD-10-CM | POA: Diagnosis not present

## 2014-02-09 DIAGNOSIS — I5040 Unspecified combined systolic (congestive) and diastolic (congestive) heart failure: Secondary | ICD-10-CM | POA: Diagnosis not present

## 2014-02-09 DIAGNOSIS — R4182 Altered mental status, unspecified: Secondary | ICD-10-CM | POA: Diagnosis not present

## 2014-02-09 DIAGNOSIS — E1149 Type 2 diabetes mellitus with other diabetic neurological complication: Secondary | ICD-10-CM | POA: Diagnosis not present

## 2014-02-09 DIAGNOSIS — R5381 Other malaise: Secondary | ICD-10-CM | POA: Diagnosis not present

## 2014-02-09 DIAGNOSIS — N289 Disorder of kidney and ureter, unspecified: Secondary | ICD-10-CM | POA: Diagnosis not present

## 2014-02-09 DIAGNOSIS — S88919A Complete traumatic amputation of unspecified lower leg, level unspecified, initial encounter: Secondary | ICD-10-CM | POA: Diagnosis not present

## 2014-02-09 DIAGNOSIS — R627 Adult failure to thrive: Secondary | ICD-10-CM | POA: Diagnosis not present

## 2014-02-09 DIAGNOSIS — R279 Unspecified lack of coordination: Secondary | ICD-10-CM | POA: Diagnosis not present

## 2014-02-09 DIAGNOSIS — T82598A Other mechanical complication of other cardiac and vascular devices and implants, initial encounter: Secondary | ICD-10-CM | POA: Diagnosis not present

## 2014-02-09 DIAGNOSIS — R262 Difficulty in walking, not elsewhere classified: Secondary | ICD-10-CM | POA: Diagnosis not present

## 2014-02-09 DIAGNOSIS — I69998 Other sequelae following unspecified cerebrovascular disease: Secondary | ICD-10-CM | POA: Diagnosis not present

## 2014-02-09 DIAGNOSIS — I5032 Chronic diastolic (congestive) heart failure: Secondary | ICD-10-CM | POA: Diagnosis not present

## 2014-02-15 ENCOUNTER — Ambulatory Visit: Payer: Self-pay | Admitting: Internal Medicine

## 2014-02-24 DIAGNOSIS — D649 Anemia, unspecified: Secondary | ICD-10-CM | POA: Diagnosis not present

## 2014-02-24 DIAGNOSIS — N186 End stage renal disease: Secondary | ICD-10-CM | POA: Diagnosis not present

## 2014-02-24 DIAGNOSIS — I10 Essential (primary) hypertension: Secondary | ICD-10-CM | POA: Diagnosis not present

## 2014-02-26 DIAGNOSIS — L89109 Pressure ulcer of unspecified part of back, unspecified stage: Secondary | ICD-10-CM | POA: Diagnosis not present

## 2014-02-26 DIAGNOSIS — L8995 Pressure ulcer of unspecified site, unstageable: Secondary | ICD-10-CM | POA: Diagnosis not present

## 2014-03-08 DIAGNOSIS — A0472 Enterocolitis due to Clostridium difficile, not specified as recurrent: Secondary | ICD-10-CM | POA: Diagnosis not present

## 2014-03-27 DIAGNOSIS — N186 End stage renal disease: Secondary | ICD-10-CM | POA: Diagnosis not present

## 2014-03-27 DIAGNOSIS — D649 Anemia, unspecified: Secondary | ICD-10-CM | POA: Diagnosis not present

## 2014-03-27 DIAGNOSIS — I10 Essential (primary) hypertension: Secondary | ICD-10-CM | POA: Diagnosis not present

## 2014-03-28 DIAGNOSIS — N186 End stage renal disease: Secondary | ICD-10-CM | POA: Diagnosis not present

## 2014-03-28 DIAGNOSIS — N039 Chronic nephritic syndrome with unspecified morphologic changes: Secondary | ICD-10-CM | POA: Diagnosis not present

## 2014-03-28 DIAGNOSIS — D631 Anemia in chronic kidney disease: Secondary | ICD-10-CM | POA: Diagnosis not present

## 2014-03-28 DIAGNOSIS — D509 Iron deficiency anemia, unspecified: Secondary | ICD-10-CM | POA: Diagnosis not present

## 2014-03-28 DIAGNOSIS — N2581 Secondary hyperparathyroidism of renal origin: Secondary | ICD-10-CM | POA: Diagnosis not present

## 2014-03-30 DIAGNOSIS — M545 Low back pain, unspecified: Secondary | ICD-10-CM | POA: Diagnosis not present

## 2014-03-30 DIAGNOSIS — E119 Type 2 diabetes mellitus without complications: Secondary | ICD-10-CM | POA: Diagnosis not present

## 2014-03-30 DIAGNOSIS — D631 Anemia in chronic kidney disease: Secondary | ICD-10-CM | POA: Diagnosis not present

## 2014-03-30 DIAGNOSIS — N2581 Secondary hyperparathyroidism of renal origin: Secondary | ICD-10-CM | POA: Diagnosis not present

## 2014-03-30 DIAGNOSIS — I10 Essential (primary) hypertension: Secondary | ICD-10-CM | POA: Diagnosis not present

## 2014-03-30 DIAGNOSIS — N186 End stage renal disease: Secondary | ICD-10-CM | POA: Diagnosis not present

## 2014-03-30 DIAGNOSIS — G609 Hereditary and idiopathic neuropathy, unspecified: Secondary | ICD-10-CM | POA: Diagnosis not present

## 2014-03-30 DIAGNOSIS — R5381 Other malaise: Secondary | ICD-10-CM | POA: Diagnosis not present

## 2014-03-30 DIAGNOSIS — I739 Peripheral vascular disease, unspecified: Secondary | ICD-10-CM | POA: Diagnosis not present

## 2014-03-30 DIAGNOSIS — I509 Heart failure, unspecified: Secondary | ICD-10-CM | POA: Diagnosis not present

## 2014-03-30 DIAGNOSIS — D509 Iron deficiency anemia, unspecified: Secondary | ICD-10-CM | POA: Diagnosis not present

## 2014-04-02 DIAGNOSIS — D631 Anemia in chronic kidney disease: Secondary | ICD-10-CM | POA: Diagnosis not present

## 2014-04-02 DIAGNOSIS — N2581 Secondary hyperparathyroidism of renal origin: Secondary | ICD-10-CM | POA: Diagnosis not present

## 2014-04-02 DIAGNOSIS — N186 End stage renal disease: Secondary | ICD-10-CM | POA: Diagnosis not present

## 2014-04-02 DIAGNOSIS — D509 Iron deficiency anemia, unspecified: Secondary | ICD-10-CM | POA: Diagnosis not present

## 2014-04-04 DIAGNOSIS — D509 Iron deficiency anemia, unspecified: Secondary | ICD-10-CM | POA: Diagnosis not present

## 2014-04-04 DIAGNOSIS — D631 Anemia in chronic kidney disease: Secondary | ICD-10-CM | POA: Diagnosis not present

## 2014-04-04 DIAGNOSIS — N186 End stage renal disease: Secondary | ICD-10-CM | POA: Diagnosis not present

## 2014-04-04 DIAGNOSIS — N2581 Secondary hyperparathyroidism of renal origin: Secondary | ICD-10-CM | POA: Diagnosis not present

## 2014-04-04 DIAGNOSIS — N039 Chronic nephritic syndrome with unspecified morphologic changes: Secondary | ICD-10-CM | POA: Diagnosis not present

## 2014-04-06 DIAGNOSIS — D509 Iron deficiency anemia, unspecified: Secondary | ICD-10-CM | POA: Diagnosis not present

## 2014-04-06 DIAGNOSIS — N2581 Secondary hyperparathyroidism of renal origin: Secondary | ICD-10-CM | POA: Diagnosis not present

## 2014-04-06 DIAGNOSIS — N186 End stage renal disease: Secondary | ICD-10-CM | POA: Diagnosis not present

## 2014-04-06 DIAGNOSIS — D631 Anemia in chronic kidney disease: Secondary | ICD-10-CM | POA: Diagnosis not present

## 2014-04-09 DIAGNOSIS — N186 End stage renal disease: Secondary | ICD-10-CM | POA: Diagnosis not present

## 2014-04-09 DIAGNOSIS — N2581 Secondary hyperparathyroidism of renal origin: Secondary | ICD-10-CM | POA: Diagnosis not present

## 2014-04-09 DIAGNOSIS — D509 Iron deficiency anemia, unspecified: Secondary | ICD-10-CM | POA: Diagnosis not present

## 2014-04-09 DIAGNOSIS — D631 Anemia in chronic kidney disease: Secondary | ICD-10-CM | POA: Diagnosis not present

## 2014-04-11 DIAGNOSIS — N2581 Secondary hyperparathyroidism of renal origin: Secondary | ICD-10-CM | POA: Diagnosis not present

## 2014-04-11 DIAGNOSIS — N039 Chronic nephritic syndrome with unspecified morphologic changes: Secondary | ICD-10-CM | POA: Diagnosis not present

## 2014-04-11 DIAGNOSIS — D509 Iron deficiency anemia, unspecified: Secondary | ICD-10-CM | POA: Diagnosis not present

## 2014-04-11 DIAGNOSIS — D631 Anemia in chronic kidney disease: Secondary | ICD-10-CM | POA: Diagnosis not present

## 2014-04-11 DIAGNOSIS — N186 End stage renal disease: Secondary | ICD-10-CM | POA: Diagnosis not present

## 2014-04-13 DIAGNOSIS — D509 Iron deficiency anemia, unspecified: Secondary | ICD-10-CM | POA: Diagnosis not present

## 2014-04-13 DIAGNOSIS — D631 Anemia in chronic kidney disease: Secondary | ICD-10-CM | POA: Diagnosis not present

## 2014-04-13 DIAGNOSIS — N2581 Secondary hyperparathyroidism of renal origin: Secondary | ICD-10-CM | POA: Diagnosis not present

## 2014-04-13 DIAGNOSIS — N186 End stage renal disease: Secondary | ICD-10-CM | POA: Diagnosis not present

## 2014-04-16 DIAGNOSIS — N186 End stage renal disease: Secondary | ICD-10-CM | POA: Diagnosis not present

## 2014-04-16 DIAGNOSIS — N2581 Secondary hyperparathyroidism of renal origin: Secondary | ICD-10-CM | POA: Diagnosis not present

## 2014-04-16 DIAGNOSIS — D631 Anemia in chronic kidney disease: Secondary | ICD-10-CM | POA: Diagnosis not present

## 2014-04-16 DIAGNOSIS — N039 Chronic nephritic syndrome with unspecified morphologic changes: Secondary | ICD-10-CM | POA: Diagnosis not present

## 2014-04-16 DIAGNOSIS — D509 Iron deficiency anemia, unspecified: Secondary | ICD-10-CM | POA: Diagnosis not present

## 2014-04-18 DIAGNOSIS — D631 Anemia in chronic kidney disease: Secondary | ICD-10-CM | POA: Diagnosis not present

## 2014-04-18 DIAGNOSIS — N186 End stage renal disease: Secondary | ICD-10-CM | POA: Diagnosis not present

## 2014-04-18 DIAGNOSIS — N2581 Secondary hyperparathyroidism of renal origin: Secondary | ICD-10-CM | POA: Diagnosis not present

## 2014-04-18 DIAGNOSIS — D509 Iron deficiency anemia, unspecified: Secondary | ICD-10-CM | POA: Diagnosis not present

## 2014-04-20 DIAGNOSIS — D631 Anemia in chronic kidney disease: Secondary | ICD-10-CM | POA: Diagnosis not present

## 2014-04-20 DIAGNOSIS — N2581 Secondary hyperparathyroidism of renal origin: Secondary | ICD-10-CM | POA: Diagnosis not present

## 2014-04-20 DIAGNOSIS — D509 Iron deficiency anemia, unspecified: Secondary | ICD-10-CM | POA: Diagnosis not present

## 2014-04-20 DIAGNOSIS — N039 Chronic nephritic syndrome with unspecified morphologic changes: Secondary | ICD-10-CM | POA: Diagnosis not present

## 2014-04-20 DIAGNOSIS — N186 End stage renal disease: Secondary | ICD-10-CM | POA: Diagnosis not present

## 2014-04-23 DIAGNOSIS — N186 End stage renal disease: Secondary | ICD-10-CM | POA: Diagnosis not present

## 2014-04-23 DIAGNOSIS — D509 Iron deficiency anemia, unspecified: Secondary | ICD-10-CM | POA: Diagnosis not present

## 2014-04-23 DIAGNOSIS — D631 Anemia in chronic kidney disease: Secondary | ICD-10-CM | POA: Diagnosis not present

## 2014-04-23 DIAGNOSIS — N2581 Secondary hyperparathyroidism of renal origin: Secondary | ICD-10-CM | POA: Diagnosis not present

## 2014-04-25 DIAGNOSIS — N2581 Secondary hyperparathyroidism of renal origin: Secondary | ICD-10-CM | POA: Diagnosis not present

## 2014-04-25 DIAGNOSIS — D509 Iron deficiency anemia, unspecified: Secondary | ICD-10-CM | POA: Diagnosis not present

## 2014-04-25 DIAGNOSIS — D631 Anemia in chronic kidney disease: Secondary | ICD-10-CM | POA: Diagnosis not present

## 2014-04-25 DIAGNOSIS — N186 End stage renal disease: Secondary | ICD-10-CM | POA: Diagnosis not present

## 2014-04-26 DIAGNOSIS — I5040 Unspecified combined systolic (congestive) and diastolic (congestive) heart failure: Secondary | ICD-10-CM | POA: Diagnosis not present

## 2014-04-26 DIAGNOSIS — G8929 Other chronic pain: Secondary | ICD-10-CM | POA: Diagnosis not present

## 2014-04-26 DIAGNOSIS — D649 Anemia, unspecified: Secondary | ICD-10-CM | POA: Diagnosis not present

## 2014-04-26 DIAGNOSIS — R279 Unspecified lack of coordination: Secondary | ICD-10-CM | POA: Diagnosis not present

## 2014-04-26 DIAGNOSIS — R1311 Dysphagia, oral phase: Secondary | ICD-10-CM | POA: Diagnosis not present

## 2014-04-26 DIAGNOSIS — E1140 Type 2 diabetes mellitus with diabetic neuropathy, unspecified: Secondary | ICD-10-CM | POA: Diagnosis not present

## 2014-04-26 DIAGNOSIS — Z89512 Acquired absence of left leg below knee: Secondary | ICD-10-CM | POA: Diagnosis not present

## 2014-04-26 DIAGNOSIS — M6281 Muscle weakness (generalized): Secondary | ICD-10-CM | POA: Diagnosis not present

## 2014-04-26 DIAGNOSIS — N186 End stage renal disease: Secondary | ICD-10-CM | POA: Diagnosis not present

## 2014-04-26 DIAGNOSIS — I10 Essential (primary) hypertension: Secondary | ICD-10-CM | POA: Diagnosis not present

## 2014-04-26 DIAGNOSIS — I69998 Other sequelae following unspecified cerebrovascular disease: Secondary | ICD-10-CM | POA: Diagnosis not present

## 2014-04-26 DIAGNOSIS — L89150 Pressure ulcer of sacral region, unstageable: Secondary | ICD-10-CM | POA: Diagnosis not present

## 2014-04-26 DIAGNOSIS — R262 Difficulty in walking, not elsewhere classified: Secondary | ICD-10-CM | POA: Diagnosis not present

## 2014-04-26 DIAGNOSIS — F0150 Vascular dementia without behavioral disturbance: Secondary | ICD-10-CM | POA: Diagnosis not present

## 2014-04-26 DIAGNOSIS — Z89511 Acquired absence of right leg below knee: Secondary | ICD-10-CM | POA: Diagnosis not present

## 2014-04-26 DIAGNOSIS — M869 Osteomyelitis, unspecified: Secondary | ICD-10-CM | POA: Diagnosis not present

## 2014-04-26 DIAGNOSIS — L89890 Pressure ulcer of other site, unstageable: Secondary | ICD-10-CM | POA: Diagnosis not present

## 2014-04-26 DIAGNOSIS — Z8673 Personal history of transient ischemic attack (TIA), and cerebral infarction without residual deficits: Secondary | ICD-10-CM | POA: Diagnosis not present

## 2014-04-26 DIAGNOSIS — R41841 Cognitive communication deficit: Secondary | ICD-10-CM | POA: Diagnosis not present

## 2014-04-27 DIAGNOSIS — S88111A Complete traumatic amputation at level between knee and ankle, right lower leg, initial encounter: Secondary | ICD-10-CM | POA: Diagnosis not present

## 2014-04-27 DIAGNOSIS — Z7401 Bed confinement status: Secondary | ICD-10-CM | POA: Diagnosis not present

## 2014-04-27 DIAGNOSIS — D631 Anemia in chronic kidney disease: Secondary | ICD-10-CM | POA: Diagnosis not present

## 2014-04-27 DIAGNOSIS — S88112A Complete traumatic amputation at level between knee and ankle, left lower leg, initial encounter: Secondary | ICD-10-CM | POA: Diagnosis not present

## 2014-04-27 DIAGNOSIS — L89302 Pressure ulcer of unspecified buttock, stage 2: Secondary | ICD-10-CM | POA: Diagnosis not present

## 2014-04-27 DIAGNOSIS — D509 Iron deficiency anemia, unspecified: Secondary | ICD-10-CM | POA: Diagnosis not present

## 2014-04-27 DIAGNOSIS — N289 Disorder of kidney and ureter, unspecified: Secondary | ICD-10-CM | POA: Diagnosis not present

## 2014-04-27 DIAGNOSIS — N186 End stage renal disease: Secondary | ICD-10-CM | POA: Diagnosis not present

## 2014-04-27 DIAGNOSIS — Z23 Encounter for immunization: Secondary | ICD-10-CM | POA: Diagnosis not present

## 2014-04-27 DIAGNOSIS — N2581 Secondary hyperparathyroidism of renal origin: Secondary | ICD-10-CM | POA: Diagnosis not present

## 2014-04-28 DIAGNOSIS — R1311 Dysphagia, oral phase: Secondary | ICD-10-CM | POA: Diagnosis not present

## 2014-04-28 DIAGNOSIS — R41841 Cognitive communication deficit: Secondary | ICD-10-CM | POA: Diagnosis not present

## 2014-04-28 DIAGNOSIS — L89890 Pressure ulcer of other site, unstageable: Secondary | ICD-10-CM | POA: Diagnosis not present

## 2014-04-28 DIAGNOSIS — L89150 Pressure ulcer of sacral region, unstageable: Secondary | ICD-10-CM | POA: Diagnosis not present

## 2014-04-28 DIAGNOSIS — Z89512 Acquired absence of left leg below knee: Secondary | ICD-10-CM | POA: Diagnosis not present

## 2014-04-28 DIAGNOSIS — M869 Osteomyelitis, unspecified: Secondary | ICD-10-CM | POA: Diagnosis not present

## 2014-04-30 DIAGNOSIS — Z89512 Acquired absence of left leg below knee: Secondary | ICD-10-CM | POA: Diagnosis not present

## 2014-04-30 DIAGNOSIS — S88112A Complete traumatic amputation at level between knee and ankle, left lower leg, initial encounter: Secondary | ICD-10-CM | POA: Diagnosis not present

## 2014-04-30 DIAGNOSIS — Z7401 Bed confinement status: Secondary | ICD-10-CM | POA: Diagnosis not present

## 2014-04-30 DIAGNOSIS — D631 Anemia in chronic kidney disease: Secondary | ICD-10-CM | POA: Diagnosis not present

## 2014-04-30 DIAGNOSIS — S88111A Complete traumatic amputation at level between knee and ankle, right lower leg, initial encounter: Secondary | ICD-10-CM | POA: Diagnosis not present

## 2014-04-30 DIAGNOSIS — D509 Iron deficiency anemia, unspecified: Secondary | ICD-10-CM | POA: Diagnosis not present

## 2014-04-30 DIAGNOSIS — R41841 Cognitive communication deficit: Secondary | ICD-10-CM | POA: Diagnosis not present

## 2014-04-30 DIAGNOSIS — N2581 Secondary hyperparathyroidism of renal origin: Secondary | ICD-10-CM | POA: Diagnosis not present

## 2014-04-30 DIAGNOSIS — Z23 Encounter for immunization: Secondary | ICD-10-CM | POA: Diagnosis not present

## 2014-04-30 DIAGNOSIS — N289 Disorder of kidney and ureter, unspecified: Secondary | ICD-10-CM | POA: Diagnosis not present

## 2014-04-30 DIAGNOSIS — M869 Osteomyelitis, unspecified: Secondary | ICD-10-CM | POA: Diagnosis not present

## 2014-04-30 DIAGNOSIS — I5020 Unspecified systolic (congestive) heart failure: Secondary | ICD-10-CM | POA: Diagnosis not present

## 2014-04-30 DIAGNOSIS — R1311 Dysphagia, oral phase: Secondary | ICD-10-CM | POA: Diagnosis not present

## 2014-04-30 DIAGNOSIS — L89150 Pressure ulcer of sacral region, unstageable: Secondary | ICD-10-CM | POA: Diagnosis not present

## 2014-04-30 DIAGNOSIS — N186 End stage renal disease: Secondary | ICD-10-CM | POA: Diagnosis not present

## 2014-04-30 DIAGNOSIS — L89303 Pressure ulcer of unspecified buttock, stage 3: Secondary | ICD-10-CM | POA: Diagnosis not present

## 2014-04-30 DIAGNOSIS — L89890 Pressure ulcer of other site, unstageable: Secondary | ICD-10-CM | POA: Diagnosis not present

## 2014-05-01 DIAGNOSIS — Z89512 Acquired absence of left leg below knee: Secondary | ICD-10-CM | POA: Diagnosis not present

## 2014-05-01 DIAGNOSIS — L89890 Pressure ulcer of other site, unstageable: Secondary | ICD-10-CM | POA: Diagnosis not present

## 2014-05-01 DIAGNOSIS — R1311 Dysphagia, oral phase: Secondary | ICD-10-CM | POA: Diagnosis not present

## 2014-05-01 DIAGNOSIS — M869 Osteomyelitis, unspecified: Secondary | ICD-10-CM | POA: Diagnosis not present

## 2014-05-01 DIAGNOSIS — R41841 Cognitive communication deficit: Secondary | ICD-10-CM | POA: Diagnosis not present

## 2014-05-01 DIAGNOSIS — L89150 Pressure ulcer of sacral region, unstageable: Secondary | ICD-10-CM | POA: Diagnosis not present

## 2014-05-02 DIAGNOSIS — R1311 Dysphagia, oral phase: Secondary | ICD-10-CM | POA: Diagnosis not present

## 2014-05-02 DIAGNOSIS — E119 Type 2 diabetes mellitus without complications: Secondary | ICD-10-CM | POA: Diagnosis not present

## 2014-05-02 DIAGNOSIS — L89150 Pressure ulcer of sacral region, unstageable: Secondary | ICD-10-CM | POA: Diagnosis not present

## 2014-05-02 DIAGNOSIS — Z89512 Acquired absence of left leg below knee: Secondary | ICD-10-CM | POA: Diagnosis not present

## 2014-05-02 DIAGNOSIS — Z5189 Encounter for other specified aftercare: Secondary | ICD-10-CM | POA: Diagnosis not present

## 2014-05-02 DIAGNOSIS — N2581 Secondary hyperparathyroidism of renal origin: Secondary | ICD-10-CM | POA: Diagnosis not present

## 2014-05-02 DIAGNOSIS — I10 Essential (primary) hypertension: Secondary | ICD-10-CM | POA: Diagnosis not present

## 2014-05-02 DIAGNOSIS — E785 Hyperlipidemia, unspecified: Secondary | ICD-10-CM | POA: Diagnosis not present

## 2014-05-02 DIAGNOSIS — M869 Osteomyelitis, unspecified: Secondary | ICD-10-CM | POA: Diagnosis not present

## 2014-05-02 DIAGNOSIS — R41841 Cognitive communication deficit: Secondary | ICD-10-CM | POA: Diagnosis not present

## 2014-05-02 DIAGNOSIS — D509 Iron deficiency anemia, unspecified: Secondary | ICD-10-CM | POA: Diagnosis not present

## 2014-05-02 DIAGNOSIS — D631 Anemia in chronic kidney disease: Secondary | ICD-10-CM | POA: Diagnosis not present

## 2014-05-02 DIAGNOSIS — Z7401 Bed confinement status: Secondary | ICD-10-CM | POA: Diagnosis not present

## 2014-05-02 DIAGNOSIS — L89890 Pressure ulcer of other site, unstageable: Secondary | ICD-10-CM | POA: Diagnosis not present

## 2014-05-02 DIAGNOSIS — G8220 Paraplegia, unspecified: Secondary | ICD-10-CM | POA: Diagnosis not present

## 2014-05-02 DIAGNOSIS — I70203 Unspecified atherosclerosis of native arteries of extremities, bilateral legs: Secondary | ICD-10-CM | POA: Diagnosis not present

## 2014-05-02 DIAGNOSIS — M545 Low back pain: Secondary | ICD-10-CM | POA: Diagnosis not present

## 2014-05-02 DIAGNOSIS — N186 End stage renal disease: Secondary | ICD-10-CM | POA: Diagnosis not present

## 2014-05-02 DIAGNOSIS — Z23 Encounter for immunization: Secondary | ICD-10-CM | POA: Diagnosis not present

## 2014-05-02 DIAGNOSIS — L89152 Pressure ulcer of sacral region, stage 2: Secondary | ICD-10-CM | POA: Diagnosis not present

## 2014-05-02 DIAGNOSIS — I509 Heart failure, unspecified: Secondary | ICD-10-CM | POA: Diagnosis not present

## 2014-05-03 DIAGNOSIS — R1311 Dysphagia, oral phase: Secondary | ICD-10-CM | POA: Diagnosis not present

## 2014-05-03 DIAGNOSIS — M869 Osteomyelitis, unspecified: Secondary | ICD-10-CM | POA: Diagnosis not present

## 2014-05-03 DIAGNOSIS — R41841 Cognitive communication deficit: Secondary | ICD-10-CM | POA: Diagnosis not present

## 2014-05-03 DIAGNOSIS — L89890 Pressure ulcer of other site, unstageable: Secondary | ICD-10-CM | POA: Diagnosis not present

## 2014-05-03 DIAGNOSIS — L89150 Pressure ulcer of sacral region, unstageable: Secondary | ICD-10-CM | POA: Diagnosis not present

## 2014-05-03 DIAGNOSIS — Z89512 Acquired absence of left leg below knee: Secondary | ICD-10-CM | POA: Diagnosis not present

## 2014-05-04 DIAGNOSIS — M869 Osteomyelitis, unspecified: Secondary | ICD-10-CM | POA: Diagnosis not present

## 2014-05-04 DIAGNOSIS — Z7401 Bed confinement status: Secondary | ICD-10-CM | POA: Diagnosis not present

## 2014-05-04 DIAGNOSIS — R1311 Dysphagia, oral phase: Secondary | ICD-10-CM | POA: Diagnosis not present

## 2014-05-04 DIAGNOSIS — R41841 Cognitive communication deficit: Secondary | ICD-10-CM | POA: Diagnosis not present

## 2014-05-04 DIAGNOSIS — D631 Anemia in chronic kidney disease: Secondary | ICD-10-CM | POA: Diagnosis not present

## 2014-05-04 DIAGNOSIS — D509 Iron deficiency anemia, unspecified: Secondary | ICD-10-CM | POA: Diagnosis not present

## 2014-05-04 DIAGNOSIS — L89890 Pressure ulcer of other site, unstageable: Secondary | ICD-10-CM | POA: Diagnosis not present

## 2014-05-04 DIAGNOSIS — N186 End stage renal disease: Secondary | ICD-10-CM | POA: Diagnosis not present

## 2014-05-04 DIAGNOSIS — L89152 Pressure ulcer of sacral region, stage 2: Secondary | ICD-10-CM | POA: Diagnosis not present

## 2014-05-04 DIAGNOSIS — N2581 Secondary hyperparathyroidism of renal origin: Secondary | ICD-10-CM | POA: Diagnosis not present

## 2014-05-04 DIAGNOSIS — L89150 Pressure ulcer of sacral region, unstageable: Secondary | ICD-10-CM | POA: Diagnosis not present

## 2014-05-04 DIAGNOSIS — Z89512 Acquired absence of left leg below knee: Secondary | ICD-10-CM | POA: Diagnosis not present

## 2014-05-04 DIAGNOSIS — Z23 Encounter for immunization: Secondary | ICD-10-CM | POA: Diagnosis not present

## 2014-05-07 DIAGNOSIS — L89150 Pressure ulcer of sacral region, unstageable: Secondary | ICD-10-CM | POA: Diagnosis not present

## 2014-05-07 DIAGNOSIS — Z23 Encounter for immunization: Secondary | ICD-10-CM | POA: Diagnosis not present

## 2014-05-07 DIAGNOSIS — G8220 Paraplegia, unspecified: Secondary | ICD-10-CM | POA: Diagnosis not present

## 2014-05-07 DIAGNOSIS — R1311 Dysphagia, oral phase: Secondary | ICD-10-CM | POA: Diagnosis not present

## 2014-05-07 DIAGNOSIS — L89152 Pressure ulcer of sacral region, stage 2: Secondary | ICD-10-CM | POA: Diagnosis not present

## 2014-05-07 DIAGNOSIS — M869 Osteomyelitis, unspecified: Secondary | ICD-10-CM | POA: Diagnosis not present

## 2014-05-07 DIAGNOSIS — N2581 Secondary hyperparathyroidism of renal origin: Secondary | ICD-10-CM | POA: Diagnosis not present

## 2014-05-07 DIAGNOSIS — D509 Iron deficiency anemia, unspecified: Secondary | ICD-10-CM | POA: Diagnosis not present

## 2014-05-07 DIAGNOSIS — R41841 Cognitive communication deficit: Secondary | ICD-10-CM | POA: Diagnosis not present

## 2014-05-07 DIAGNOSIS — N186 End stage renal disease: Secondary | ICD-10-CM | POA: Diagnosis not present

## 2014-05-07 DIAGNOSIS — Z7401 Bed confinement status: Secondary | ICD-10-CM | POA: Diagnosis not present

## 2014-05-07 DIAGNOSIS — D631 Anemia in chronic kidney disease: Secondary | ICD-10-CM | POA: Diagnosis not present

## 2014-05-07 DIAGNOSIS — L89890 Pressure ulcer of other site, unstageable: Secondary | ICD-10-CM | POA: Diagnosis not present

## 2014-05-07 DIAGNOSIS — Z89512 Acquired absence of left leg below knee: Secondary | ICD-10-CM | POA: Diagnosis not present

## 2014-05-08 DIAGNOSIS — L89890 Pressure ulcer of other site, unstageable: Secondary | ICD-10-CM | POA: Diagnosis not present

## 2014-05-08 DIAGNOSIS — Z89512 Acquired absence of left leg below knee: Secondary | ICD-10-CM | POA: Diagnosis not present

## 2014-05-08 DIAGNOSIS — R41841 Cognitive communication deficit: Secondary | ICD-10-CM | POA: Diagnosis not present

## 2014-05-08 DIAGNOSIS — R1311 Dysphagia, oral phase: Secondary | ICD-10-CM | POA: Diagnosis not present

## 2014-05-08 DIAGNOSIS — L89150 Pressure ulcer of sacral region, unstageable: Secondary | ICD-10-CM | POA: Diagnosis not present

## 2014-05-08 DIAGNOSIS — M869 Osteomyelitis, unspecified: Secondary | ICD-10-CM | POA: Diagnosis not present

## 2014-05-09 DIAGNOSIS — R1311 Dysphagia, oral phase: Secondary | ICD-10-CM | POA: Diagnosis not present

## 2014-05-09 DIAGNOSIS — Z23 Encounter for immunization: Secondary | ICD-10-CM | POA: Diagnosis not present

## 2014-05-09 DIAGNOSIS — D631 Anemia in chronic kidney disease: Secondary | ICD-10-CM | POA: Diagnosis not present

## 2014-05-09 DIAGNOSIS — L89890 Pressure ulcer of other site, unstageable: Secondary | ICD-10-CM | POA: Diagnosis not present

## 2014-05-09 DIAGNOSIS — Z7401 Bed confinement status: Secondary | ICD-10-CM | POA: Diagnosis not present

## 2014-05-09 DIAGNOSIS — R41841 Cognitive communication deficit: Secondary | ICD-10-CM | POA: Diagnosis not present

## 2014-05-09 DIAGNOSIS — R279 Unspecified lack of coordination: Secondary | ICD-10-CM | POA: Diagnosis not present

## 2014-05-09 DIAGNOSIS — L89150 Pressure ulcer of sacral region, unstageable: Secondary | ICD-10-CM | POA: Diagnosis not present

## 2014-05-09 DIAGNOSIS — D509 Iron deficiency anemia, unspecified: Secondary | ICD-10-CM | POA: Diagnosis not present

## 2014-05-09 DIAGNOSIS — Z89512 Acquired absence of left leg below knee: Secondary | ICD-10-CM | POA: Diagnosis not present

## 2014-05-09 DIAGNOSIS — M869 Osteomyelitis, unspecified: Secondary | ICD-10-CM | POA: Diagnosis not present

## 2014-05-09 DIAGNOSIS — N2581 Secondary hyperparathyroidism of renal origin: Secondary | ICD-10-CM | POA: Diagnosis not present

## 2014-05-09 DIAGNOSIS — N186 End stage renal disease: Secondary | ICD-10-CM | POA: Diagnosis not present

## 2014-05-10 DIAGNOSIS — N186 End stage renal disease: Secondary | ICD-10-CM | POA: Diagnosis not present

## 2014-05-10 DIAGNOSIS — E1040 Type 1 diabetes mellitus with diabetic neuropathy, unspecified: Secondary | ICD-10-CM | POA: Diagnosis not present

## 2014-05-10 DIAGNOSIS — E1022 Type 1 diabetes mellitus with diabetic chronic kidney disease: Secondary | ICD-10-CM | POA: Diagnosis not present

## 2014-05-10 DIAGNOSIS — S46302A Unspecified injury of muscle, fascia and tendon of triceps, left arm, initial encounter: Secondary | ICD-10-CM | POA: Diagnosis not present

## 2014-05-10 DIAGNOSIS — Z992 Dependence on renal dialysis: Secondary | ICD-10-CM | POA: Diagnosis not present

## 2014-05-10 DIAGNOSIS — M79602 Pain in left arm: Secondary | ICD-10-CM | POA: Diagnosis not present

## 2014-05-10 DIAGNOSIS — B1920 Unspecified viral hepatitis C without hepatic coma: Secondary | ICD-10-CM | POA: Diagnosis not present

## 2014-05-11 ENCOUNTER — Other Ambulatory Visit: Payer: Self-pay

## 2014-05-11 DIAGNOSIS — N186 End stage renal disease: Secondary | ICD-10-CM | POA: Diagnosis not present

## 2014-05-11 DIAGNOSIS — Z7401 Bed confinement status: Secondary | ICD-10-CM | POA: Diagnosis not present

## 2014-05-11 DIAGNOSIS — N189 Chronic kidney disease, unspecified: Secondary | ICD-10-CM | POA: Diagnosis not present

## 2014-05-11 DIAGNOSIS — N2581 Secondary hyperparathyroidism of renal origin: Secondary | ICD-10-CM | POA: Diagnosis not present

## 2014-05-11 DIAGNOSIS — D509 Iron deficiency anemia, unspecified: Secondary | ICD-10-CM | POA: Diagnosis not present

## 2014-05-11 DIAGNOSIS — Z23 Encounter for immunization: Secondary | ICD-10-CM | POA: Diagnosis not present

## 2014-05-11 DIAGNOSIS — L89309 Pressure ulcer of unspecified buttock, unspecified stage: Secondary | ICD-10-CM | POA: Diagnosis not present

## 2014-05-11 DIAGNOSIS — R279 Unspecified lack of coordination: Secondary | ICD-10-CM | POA: Diagnosis not present

## 2014-05-11 DIAGNOSIS — D631 Anemia in chronic kidney disease: Secondary | ICD-10-CM | POA: Diagnosis not present

## 2014-05-14 DIAGNOSIS — R1311 Dysphagia, oral phase: Secondary | ICD-10-CM | POA: Diagnosis not present

## 2014-05-14 DIAGNOSIS — D631 Anemia in chronic kidney disease: Secondary | ICD-10-CM | POA: Diagnosis not present

## 2014-05-14 DIAGNOSIS — R279 Unspecified lack of coordination: Secondary | ICD-10-CM | POA: Diagnosis not present

## 2014-05-14 DIAGNOSIS — L89309 Pressure ulcer of unspecified buttock, unspecified stage: Secondary | ICD-10-CM | POA: Diagnosis not present

## 2014-05-14 DIAGNOSIS — N186 End stage renal disease: Secondary | ICD-10-CM | POA: Diagnosis not present

## 2014-05-14 DIAGNOSIS — L89890 Pressure ulcer of other site, unstageable: Secondary | ICD-10-CM | POA: Diagnosis not present

## 2014-05-14 DIAGNOSIS — R41841 Cognitive communication deficit: Secondary | ICD-10-CM | POA: Diagnosis not present

## 2014-05-14 DIAGNOSIS — Z23 Encounter for immunization: Secondary | ICD-10-CM | POA: Diagnosis not present

## 2014-05-14 DIAGNOSIS — D509 Iron deficiency anemia, unspecified: Secondary | ICD-10-CM | POA: Diagnosis not present

## 2014-05-14 DIAGNOSIS — Z7401 Bed confinement status: Secondary | ICD-10-CM | POA: Diagnosis not present

## 2014-05-14 DIAGNOSIS — L89150 Pressure ulcer of sacral region, unstageable: Secondary | ICD-10-CM | POA: Diagnosis not present

## 2014-05-14 DIAGNOSIS — Z89512 Acquired absence of left leg below knee: Secondary | ICD-10-CM | POA: Diagnosis not present

## 2014-05-14 DIAGNOSIS — M869 Osteomyelitis, unspecified: Secondary | ICD-10-CM | POA: Diagnosis not present

## 2014-05-14 DIAGNOSIS — N2581 Secondary hyperparathyroidism of renal origin: Secondary | ICD-10-CM | POA: Diagnosis not present

## 2014-05-15 DIAGNOSIS — M869 Osteomyelitis, unspecified: Secondary | ICD-10-CM | POA: Diagnosis not present

## 2014-05-15 DIAGNOSIS — L89890 Pressure ulcer of other site, unstageable: Secondary | ICD-10-CM | POA: Diagnosis not present

## 2014-05-15 DIAGNOSIS — R41841 Cognitive communication deficit: Secondary | ICD-10-CM | POA: Diagnosis not present

## 2014-05-15 DIAGNOSIS — R1311 Dysphagia, oral phase: Secondary | ICD-10-CM | POA: Diagnosis not present

## 2014-05-15 DIAGNOSIS — L89150 Pressure ulcer of sacral region, unstageable: Secondary | ICD-10-CM | POA: Diagnosis not present

## 2014-05-15 DIAGNOSIS — Z89512 Acquired absence of left leg below knee: Secondary | ICD-10-CM | POA: Diagnosis not present

## 2014-05-16 DIAGNOSIS — Z7401 Bed confinement status: Secondary | ICD-10-CM | POA: Diagnosis not present

## 2014-05-16 DIAGNOSIS — D509 Iron deficiency anemia, unspecified: Secondary | ICD-10-CM | POA: Diagnosis not present

## 2014-05-16 DIAGNOSIS — N186 End stage renal disease: Secondary | ICD-10-CM | POA: Diagnosis not present

## 2014-05-16 DIAGNOSIS — Z23 Encounter for immunization: Secondary | ICD-10-CM | POA: Diagnosis not present

## 2014-05-16 DIAGNOSIS — N189 Chronic kidney disease, unspecified: Secondary | ICD-10-CM | POA: Diagnosis not present

## 2014-05-16 DIAGNOSIS — R279 Unspecified lack of coordination: Secondary | ICD-10-CM | POA: Diagnosis not present

## 2014-05-16 DIAGNOSIS — N2581 Secondary hyperparathyroidism of renal origin: Secondary | ICD-10-CM | POA: Diagnosis not present

## 2014-05-16 DIAGNOSIS — D631 Anemia in chronic kidney disease: Secondary | ICD-10-CM | POA: Diagnosis not present

## 2014-05-17 DIAGNOSIS — L89150 Pressure ulcer of sacral region, unstageable: Secondary | ICD-10-CM | POA: Diagnosis not present

## 2014-05-17 DIAGNOSIS — Z89512 Acquired absence of left leg below knee: Secondary | ICD-10-CM | POA: Diagnosis not present

## 2014-05-17 DIAGNOSIS — L89890 Pressure ulcer of other site, unstageable: Secondary | ICD-10-CM | POA: Diagnosis not present

## 2014-05-17 DIAGNOSIS — M869 Osteomyelitis, unspecified: Secondary | ICD-10-CM | POA: Diagnosis not present

## 2014-05-17 DIAGNOSIS — R1311 Dysphagia, oral phase: Secondary | ICD-10-CM | POA: Diagnosis not present

## 2014-05-17 DIAGNOSIS — R41841 Cognitive communication deficit: Secondary | ICD-10-CM | POA: Diagnosis not present

## 2014-05-18 DIAGNOSIS — D509 Iron deficiency anemia, unspecified: Secondary | ICD-10-CM | POA: Diagnosis not present

## 2014-05-18 DIAGNOSIS — N186 End stage renal disease: Secondary | ICD-10-CM | POA: Diagnosis not present

## 2014-05-18 DIAGNOSIS — R279 Unspecified lack of coordination: Secondary | ICD-10-CM | POA: Diagnosis not present

## 2014-05-18 DIAGNOSIS — L89150 Pressure ulcer of sacral region, unstageable: Secondary | ICD-10-CM | POA: Diagnosis not present

## 2014-05-18 DIAGNOSIS — L8990 Pressure ulcer of unspecified site, unspecified stage: Secondary | ICD-10-CM | POA: Diagnosis not present

## 2014-05-18 DIAGNOSIS — N2581 Secondary hyperparathyroidism of renal origin: Secondary | ICD-10-CM | POA: Diagnosis not present

## 2014-05-18 DIAGNOSIS — M869 Osteomyelitis, unspecified: Secondary | ICD-10-CM | POA: Diagnosis not present

## 2014-05-18 DIAGNOSIS — R41841 Cognitive communication deficit: Secondary | ICD-10-CM | POA: Diagnosis not present

## 2014-05-18 DIAGNOSIS — Z89512 Acquired absence of left leg below knee: Secondary | ICD-10-CM | POA: Diagnosis not present

## 2014-05-18 DIAGNOSIS — D631 Anemia in chronic kidney disease: Secondary | ICD-10-CM | POA: Diagnosis not present

## 2014-05-18 DIAGNOSIS — Z7401 Bed confinement status: Secondary | ICD-10-CM | POA: Diagnosis not present

## 2014-05-18 DIAGNOSIS — L89890 Pressure ulcer of other site, unstageable: Secondary | ICD-10-CM | POA: Diagnosis not present

## 2014-05-18 DIAGNOSIS — Z23 Encounter for immunization: Secondary | ICD-10-CM | POA: Diagnosis not present

## 2014-05-18 DIAGNOSIS — R1311 Dysphagia, oral phase: Secondary | ICD-10-CM | POA: Diagnosis not present

## 2014-05-19 DIAGNOSIS — R1311 Dysphagia, oral phase: Secondary | ICD-10-CM | POA: Diagnosis not present

## 2014-05-19 DIAGNOSIS — M869 Osteomyelitis, unspecified: Secondary | ICD-10-CM | POA: Diagnosis not present

## 2014-05-19 DIAGNOSIS — L89150 Pressure ulcer of sacral region, unstageable: Secondary | ICD-10-CM | POA: Diagnosis not present

## 2014-05-19 DIAGNOSIS — L89890 Pressure ulcer of other site, unstageable: Secondary | ICD-10-CM | POA: Diagnosis not present

## 2014-05-19 DIAGNOSIS — R41841 Cognitive communication deficit: Secondary | ICD-10-CM | POA: Diagnosis not present

## 2014-05-19 DIAGNOSIS — Z89512 Acquired absence of left leg below knee: Secondary | ICD-10-CM | POA: Diagnosis not present

## 2014-05-21 DIAGNOSIS — M869 Osteomyelitis, unspecified: Secondary | ICD-10-CM | POA: Diagnosis not present

## 2014-05-21 DIAGNOSIS — N186 End stage renal disease: Secondary | ICD-10-CM | POA: Diagnosis not present

## 2014-05-21 DIAGNOSIS — R41841 Cognitive communication deficit: Secondary | ICD-10-CM | POA: Diagnosis not present

## 2014-05-21 DIAGNOSIS — L89150 Pressure ulcer of sacral region, unstageable: Secondary | ICD-10-CM | POA: Diagnosis not present

## 2014-05-21 DIAGNOSIS — L8990 Pressure ulcer of unspecified site, unspecified stage: Secondary | ICD-10-CM | POA: Diagnosis not present

## 2014-05-21 DIAGNOSIS — L89890 Pressure ulcer of other site, unstageable: Secondary | ICD-10-CM | POA: Diagnosis not present

## 2014-05-21 DIAGNOSIS — Z89512 Acquired absence of left leg below knee: Secondary | ICD-10-CM | POA: Diagnosis not present

## 2014-05-21 DIAGNOSIS — R1311 Dysphagia, oral phase: Secondary | ICD-10-CM | POA: Diagnosis not present

## 2014-05-21 DIAGNOSIS — D509 Iron deficiency anemia, unspecified: Secondary | ICD-10-CM | POA: Diagnosis not present

## 2014-05-21 DIAGNOSIS — D631 Anemia in chronic kidney disease: Secondary | ICD-10-CM | POA: Diagnosis not present

## 2014-05-21 DIAGNOSIS — Z7401 Bed confinement status: Secondary | ICD-10-CM | POA: Diagnosis not present

## 2014-05-21 DIAGNOSIS — Z23 Encounter for immunization: Secondary | ICD-10-CM | POA: Diagnosis not present

## 2014-05-21 DIAGNOSIS — N2581 Secondary hyperparathyroidism of renal origin: Secondary | ICD-10-CM | POA: Diagnosis not present

## 2014-05-22 DIAGNOSIS — Z89512 Acquired absence of left leg below knee: Secondary | ICD-10-CM | POA: Diagnosis not present

## 2014-05-22 DIAGNOSIS — Z7902 Long term (current) use of antithrombotics/antiplatelets: Secondary | ICD-10-CM | POA: Diagnosis not present

## 2014-05-22 DIAGNOSIS — B1920 Unspecified viral hepatitis C without hepatic coma: Secondary | ICD-10-CM | POA: Diagnosis not present

## 2014-05-22 DIAGNOSIS — F329 Major depressive disorder, single episode, unspecified: Secondary | ICD-10-CM | POA: Diagnosis not present

## 2014-05-22 DIAGNOSIS — M869 Osteomyelitis, unspecified: Secondary | ICD-10-CM | POA: Diagnosis not present

## 2014-05-22 DIAGNOSIS — L89150 Pressure ulcer of sacral region, unstageable: Secondary | ICD-10-CM | POA: Diagnosis not present

## 2014-05-22 DIAGNOSIS — N186 End stage renal disease: Secondary | ICD-10-CM | POA: Diagnosis not present

## 2014-05-22 DIAGNOSIS — N189 Chronic kidney disease, unspecified: Secondary | ICD-10-CM | POA: Diagnosis not present

## 2014-05-22 DIAGNOSIS — I120 Hypertensive chronic kidney disease with stage 5 chronic kidney disease or end stage renal disease: Secondary | ICD-10-CM | POA: Diagnosis not present

## 2014-05-22 DIAGNOSIS — L89890 Pressure ulcer of other site, unstageable: Secondary | ICD-10-CM | POA: Diagnosis not present

## 2014-05-22 DIAGNOSIS — E780 Pure hypercholesterolemia: Secondary | ICD-10-CM | POA: Diagnosis not present

## 2014-05-22 DIAGNOSIS — E1040 Type 1 diabetes mellitus with diabetic neuropathy, unspecified: Secondary | ICD-10-CM | POA: Diagnosis not present

## 2014-05-22 DIAGNOSIS — R41841 Cognitive communication deficit: Secondary | ICD-10-CM | POA: Diagnosis not present

## 2014-05-22 DIAGNOSIS — T82858A Stenosis of vascular prosthetic devices, implants and grafts, initial encounter: Secondary | ICD-10-CM | POA: Diagnosis not present

## 2014-05-22 DIAGNOSIS — Z79899 Other long term (current) drug therapy: Secondary | ICD-10-CM | POA: Diagnosis not present

## 2014-05-22 DIAGNOSIS — R1311 Dysphagia, oral phase: Secondary | ICD-10-CM | POA: Diagnosis not present

## 2014-05-22 DIAGNOSIS — I6789 Other cerebrovascular disease: Secondary | ICD-10-CM | POA: Diagnosis not present

## 2014-05-22 DIAGNOSIS — Z992 Dependence on renal dialysis: Secondary | ICD-10-CM | POA: Diagnosis not present

## 2014-05-22 DIAGNOSIS — E785 Hyperlipidemia, unspecified: Secondary | ICD-10-CM | POA: Diagnosis not present

## 2014-05-23 DIAGNOSIS — Z23 Encounter for immunization: Secondary | ICD-10-CM | POA: Diagnosis not present

## 2014-05-23 DIAGNOSIS — R279 Unspecified lack of coordination: Secondary | ICD-10-CM | POA: Diagnosis not present

## 2014-05-23 DIAGNOSIS — N189 Chronic kidney disease, unspecified: Secondary | ICD-10-CM | POA: Diagnosis not present

## 2014-05-23 DIAGNOSIS — D631 Anemia in chronic kidney disease: Secondary | ICD-10-CM | POA: Diagnosis not present

## 2014-05-23 DIAGNOSIS — L89890 Pressure ulcer of other site, unstageable: Secondary | ICD-10-CM | POA: Diagnosis not present

## 2014-05-23 DIAGNOSIS — M869 Osteomyelitis, unspecified: Secondary | ICD-10-CM | POA: Diagnosis not present

## 2014-05-23 DIAGNOSIS — R41841 Cognitive communication deficit: Secondary | ICD-10-CM | POA: Diagnosis not present

## 2014-05-23 DIAGNOSIS — Z89512 Acquired absence of left leg below knee: Secondary | ICD-10-CM | POA: Diagnosis not present

## 2014-05-23 DIAGNOSIS — D509 Iron deficiency anemia, unspecified: Secondary | ICD-10-CM | POA: Diagnosis not present

## 2014-05-23 DIAGNOSIS — L89150 Pressure ulcer of sacral region, unstageable: Secondary | ICD-10-CM | POA: Diagnosis not present

## 2014-05-23 DIAGNOSIS — Z7401 Bed confinement status: Secondary | ICD-10-CM | POA: Diagnosis not present

## 2014-05-23 DIAGNOSIS — R1311 Dysphagia, oral phase: Secondary | ICD-10-CM | POA: Diagnosis not present

## 2014-05-23 DIAGNOSIS — N2581 Secondary hyperparathyroidism of renal origin: Secondary | ICD-10-CM | POA: Diagnosis not present

## 2014-05-23 DIAGNOSIS — N186 End stage renal disease: Secondary | ICD-10-CM | POA: Diagnosis not present

## 2014-05-24 DIAGNOSIS — R1311 Dysphagia, oral phase: Secondary | ICD-10-CM | POA: Diagnosis not present

## 2014-05-24 DIAGNOSIS — R41841 Cognitive communication deficit: Secondary | ICD-10-CM | POA: Diagnosis not present

## 2014-05-24 DIAGNOSIS — Z89512 Acquired absence of left leg below knee: Secondary | ICD-10-CM | POA: Diagnosis not present

## 2014-05-24 DIAGNOSIS — M869 Osteomyelitis, unspecified: Secondary | ICD-10-CM | POA: Diagnosis not present

## 2014-05-24 DIAGNOSIS — L89890 Pressure ulcer of other site, unstageable: Secondary | ICD-10-CM | POA: Diagnosis not present

## 2014-05-24 DIAGNOSIS — L89150 Pressure ulcer of sacral region, unstageable: Secondary | ICD-10-CM | POA: Diagnosis not present

## 2014-05-25 DIAGNOSIS — L89890 Pressure ulcer of other site, unstageable: Secondary | ICD-10-CM | POA: Diagnosis not present

## 2014-05-25 DIAGNOSIS — Z89512 Acquired absence of left leg below knee: Secondary | ICD-10-CM | POA: Diagnosis not present

## 2014-05-25 DIAGNOSIS — Z7401 Bed confinement status: Secondary | ICD-10-CM | POA: Diagnosis not present

## 2014-05-25 DIAGNOSIS — R1311 Dysphagia, oral phase: Secondary | ICD-10-CM | POA: Diagnosis not present

## 2014-05-25 DIAGNOSIS — D509 Iron deficiency anemia, unspecified: Secondary | ICD-10-CM | POA: Diagnosis not present

## 2014-05-25 DIAGNOSIS — Z23 Encounter for immunization: Secondary | ICD-10-CM | POA: Diagnosis not present

## 2014-05-25 DIAGNOSIS — N2581 Secondary hyperparathyroidism of renal origin: Secondary | ICD-10-CM | POA: Diagnosis not present

## 2014-05-25 DIAGNOSIS — M869 Osteomyelitis, unspecified: Secondary | ICD-10-CM | POA: Diagnosis not present

## 2014-05-25 DIAGNOSIS — D631 Anemia in chronic kidney disease: Secondary | ICD-10-CM | POA: Diagnosis not present

## 2014-05-25 DIAGNOSIS — R279 Unspecified lack of coordination: Secondary | ICD-10-CM | POA: Diagnosis not present

## 2014-05-25 DIAGNOSIS — R41841 Cognitive communication deficit: Secondary | ICD-10-CM | POA: Diagnosis not present

## 2014-05-25 DIAGNOSIS — L89150 Pressure ulcer of sacral region, unstageable: Secondary | ICD-10-CM | POA: Diagnosis not present

## 2014-05-25 DIAGNOSIS — N189 Chronic kidney disease, unspecified: Secondary | ICD-10-CM | POA: Diagnosis not present

## 2014-05-25 DIAGNOSIS — N186 End stage renal disease: Secondary | ICD-10-CM | POA: Diagnosis not present

## 2014-05-26 DIAGNOSIS — M869 Osteomyelitis, unspecified: Secondary | ICD-10-CM | POA: Diagnosis not present

## 2014-05-26 DIAGNOSIS — R41841 Cognitive communication deficit: Secondary | ICD-10-CM | POA: Diagnosis not present

## 2014-05-26 DIAGNOSIS — R1311 Dysphagia, oral phase: Secondary | ICD-10-CM | POA: Diagnosis not present

## 2014-05-26 DIAGNOSIS — L89890 Pressure ulcer of other site, unstageable: Secondary | ICD-10-CM | POA: Diagnosis not present

## 2014-05-26 DIAGNOSIS — L89150 Pressure ulcer of sacral region, unstageable: Secondary | ICD-10-CM | POA: Diagnosis not present

## 2014-05-26 DIAGNOSIS — Z89512 Acquired absence of left leg below knee: Secondary | ICD-10-CM | POA: Diagnosis not present

## 2014-05-27 DIAGNOSIS — D649 Anemia, unspecified: Secondary | ICD-10-CM | POA: Diagnosis not present

## 2014-05-27 DIAGNOSIS — I10 Essential (primary) hypertension: Secondary | ICD-10-CM | POA: Diagnosis not present

## 2014-05-27 DIAGNOSIS — N186 End stage renal disease: Secondary | ICD-10-CM | POA: Diagnosis not present

## 2014-05-28 DIAGNOSIS — D631 Anemia in chronic kidney disease: Secondary | ICD-10-CM | POA: Diagnosis not present

## 2014-05-28 DIAGNOSIS — N2581 Secondary hyperparathyroidism of renal origin: Secondary | ICD-10-CM | POA: Diagnosis not present

## 2014-05-28 DIAGNOSIS — N186 End stage renal disease: Secondary | ICD-10-CM | POA: Diagnosis not present

## 2014-05-28 DIAGNOSIS — R279 Unspecified lack of coordination: Secondary | ICD-10-CM | POA: Diagnosis not present

## 2014-05-28 DIAGNOSIS — L8990 Pressure ulcer of unspecified site, unspecified stage: Secondary | ICD-10-CM | POA: Diagnosis not present

## 2014-05-28 DIAGNOSIS — N189 Chronic kidney disease, unspecified: Secondary | ICD-10-CM | POA: Diagnosis not present

## 2014-05-28 DIAGNOSIS — Z7401 Bed confinement status: Secondary | ICD-10-CM | POA: Diagnosis not present

## 2014-05-28 DIAGNOSIS — D509 Iron deficiency anemia, unspecified: Secondary | ICD-10-CM | POA: Diagnosis not present

## 2014-05-29 DIAGNOSIS — I120 Hypertensive chronic kidney disease with stage 5 chronic kidney disease or end stage renal disease: Secondary | ICD-10-CM | POA: Diagnosis not present

## 2014-05-29 DIAGNOSIS — T82590A Other mechanical complication of surgically created arteriovenous fistula, initial encounter: Secondary | ICD-10-CM | POA: Diagnosis not present

## 2014-05-29 DIAGNOSIS — E780 Pure hypercholesterolemia: Secondary | ICD-10-CM | POA: Diagnosis not present

## 2014-05-29 DIAGNOSIS — E1040 Type 1 diabetes mellitus with diabetic neuropathy, unspecified: Secondary | ICD-10-CM | POA: Diagnosis not present

## 2014-05-29 DIAGNOSIS — T82858A Stenosis of vascular prosthetic devices, implants and grafts, initial encounter: Secondary | ICD-10-CM | POA: Diagnosis not present

## 2014-05-29 DIAGNOSIS — S78119A Complete traumatic amputation at level between unspecified hip and knee, initial encounter: Secondary | ICD-10-CM | POA: Diagnosis not present

## 2014-05-29 DIAGNOSIS — N186 End stage renal disease: Secondary | ICD-10-CM | POA: Diagnosis not present

## 2014-05-29 DIAGNOSIS — J860 Pyothorax with fistula: Secondary | ICD-10-CM | POA: Diagnosis not present

## 2014-05-29 DIAGNOSIS — Z79899 Other long term (current) drug therapy: Secondary | ICD-10-CM | POA: Diagnosis not present

## 2014-05-30 DIAGNOSIS — D631 Anemia in chronic kidney disease: Secondary | ICD-10-CM | POA: Diagnosis not present

## 2014-05-30 DIAGNOSIS — R279 Unspecified lack of coordination: Secondary | ICD-10-CM | POA: Diagnosis not present

## 2014-05-30 DIAGNOSIS — N186 End stage renal disease: Secondary | ICD-10-CM | POA: Diagnosis not present

## 2014-05-30 DIAGNOSIS — E119 Type 2 diabetes mellitus without complications: Secondary | ICD-10-CM | POA: Diagnosis not present

## 2014-05-30 DIAGNOSIS — N2581 Secondary hyperparathyroidism of renal origin: Secondary | ICD-10-CM | POA: Diagnosis not present

## 2014-05-30 DIAGNOSIS — D509 Iron deficiency anemia, unspecified: Secondary | ICD-10-CM | POA: Diagnosis not present

## 2014-05-30 DIAGNOSIS — Z7401 Bed confinement status: Secondary | ICD-10-CM | POA: Diagnosis not present

## 2014-05-31 DIAGNOSIS — L89890 Pressure ulcer of other site, unstageable: Secondary | ICD-10-CM | POA: Diagnosis not present

## 2014-05-31 DIAGNOSIS — L89150 Pressure ulcer of sacral region, unstageable: Secondary | ICD-10-CM | POA: Diagnosis not present

## 2014-05-31 DIAGNOSIS — M869 Osteomyelitis, unspecified: Secondary | ICD-10-CM | POA: Diagnosis not present

## 2014-05-31 DIAGNOSIS — M6281 Muscle weakness (generalized): Secondary | ICD-10-CM | POA: Diagnosis not present

## 2014-05-31 DIAGNOSIS — I10 Essential (primary) hypertension: Secondary | ICD-10-CM | POA: Diagnosis not present

## 2014-05-31 DIAGNOSIS — I5040 Unspecified combined systolic (congestive) and diastolic (congestive) heart failure: Secondary | ICD-10-CM | POA: Diagnosis not present

## 2014-05-31 DIAGNOSIS — Z89511 Acquired absence of right leg below knee: Secondary | ICD-10-CM | POA: Diagnosis not present

## 2014-05-31 DIAGNOSIS — N186 End stage renal disease: Secondary | ICD-10-CM | POA: Diagnosis not present

## 2014-05-31 DIAGNOSIS — R41841 Cognitive communication deficit: Secondary | ICD-10-CM | POA: Diagnosis not present

## 2014-05-31 DIAGNOSIS — F4322 Adjustment disorder with anxiety: Secondary | ICD-10-CM | POA: Diagnosis not present

## 2014-05-31 DIAGNOSIS — F329 Major depressive disorder, single episode, unspecified: Secondary | ICD-10-CM | POA: Diagnosis not present

## 2014-05-31 DIAGNOSIS — Z89512 Acquired absence of left leg below knee: Secondary | ICD-10-CM | POA: Diagnosis not present

## 2014-05-31 DIAGNOSIS — R1311 Dysphagia, oral phase: Secondary | ICD-10-CM | POA: Diagnosis not present

## 2014-05-31 DIAGNOSIS — F0150 Vascular dementia without behavioral disturbance: Secondary | ICD-10-CM | POA: Diagnosis not present

## 2014-05-31 DIAGNOSIS — R262 Difficulty in walking, not elsewhere classified: Secondary | ICD-10-CM | POA: Diagnosis not present

## 2014-05-31 DIAGNOSIS — G8929 Other chronic pain: Secondary | ICD-10-CM | POA: Diagnosis not present

## 2014-05-31 DIAGNOSIS — I69998 Other sequelae following unspecified cerebrovascular disease: Secondary | ICD-10-CM | POA: Diagnosis not present

## 2014-05-31 DIAGNOSIS — Z8673 Personal history of transient ischemic attack (TIA), and cerebral infarction without residual deficits: Secondary | ICD-10-CM | POA: Diagnosis not present

## 2014-05-31 DIAGNOSIS — E1140 Type 2 diabetes mellitus with diabetic neuropathy, unspecified: Secondary | ICD-10-CM | POA: Diagnosis not present

## 2014-05-31 DIAGNOSIS — R279 Unspecified lack of coordination: Secondary | ICD-10-CM | POA: Diagnosis not present

## 2014-06-01 DIAGNOSIS — N189 Chronic kidney disease, unspecified: Secondary | ICD-10-CM | POA: Diagnosis not present

## 2014-06-01 DIAGNOSIS — Z7401 Bed confinement status: Secondary | ICD-10-CM | POA: Diagnosis not present

## 2014-06-01 DIAGNOSIS — L8990 Pressure ulcer of unspecified site, unspecified stage: Secondary | ICD-10-CM | POA: Diagnosis not present

## 2014-06-01 DIAGNOSIS — D631 Anemia in chronic kidney disease: Secondary | ICD-10-CM | POA: Diagnosis not present

## 2014-06-01 DIAGNOSIS — N186 End stage renal disease: Secondary | ICD-10-CM | POA: Diagnosis not present

## 2014-06-01 DIAGNOSIS — D509 Iron deficiency anemia, unspecified: Secondary | ICD-10-CM | POA: Diagnosis not present

## 2014-06-01 DIAGNOSIS — L89890 Pressure ulcer of other site, unstageable: Secondary | ICD-10-CM | POA: Diagnosis not present

## 2014-06-01 DIAGNOSIS — R1311 Dysphagia, oral phase: Secondary | ICD-10-CM | POA: Diagnosis not present

## 2014-06-01 DIAGNOSIS — R41841 Cognitive communication deficit: Secondary | ICD-10-CM | POA: Diagnosis not present

## 2014-06-01 DIAGNOSIS — Z89512 Acquired absence of left leg below knee: Secondary | ICD-10-CM | POA: Diagnosis not present

## 2014-06-01 DIAGNOSIS — M869 Osteomyelitis, unspecified: Secondary | ICD-10-CM | POA: Diagnosis not present

## 2014-06-01 DIAGNOSIS — N2581 Secondary hyperparathyroidism of renal origin: Secondary | ICD-10-CM | POA: Diagnosis not present

## 2014-06-01 DIAGNOSIS — L89150 Pressure ulcer of sacral region, unstageable: Secondary | ICD-10-CM | POA: Diagnosis not present

## 2014-06-02 DIAGNOSIS — L89150 Pressure ulcer of sacral region, unstageable: Secondary | ICD-10-CM | POA: Diagnosis not present

## 2014-06-02 DIAGNOSIS — R1311 Dysphagia, oral phase: Secondary | ICD-10-CM | POA: Diagnosis not present

## 2014-06-02 DIAGNOSIS — R41841 Cognitive communication deficit: Secondary | ICD-10-CM | POA: Diagnosis not present

## 2014-06-02 DIAGNOSIS — Z89512 Acquired absence of left leg below knee: Secondary | ICD-10-CM | POA: Diagnosis not present

## 2014-06-02 DIAGNOSIS — M869 Osteomyelitis, unspecified: Secondary | ICD-10-CM | POA: Diagnosis not present

## 2014-06-02 DIAGNOSIS — L89890 Pressure ulcer of other site, unstageable: Secondary | ICD-10-CM | POA: Diagnosis not present

## 2014-06-04 DIAGNOSIS — N189 Chronic kidney disease, unspecified: Secondary | ICD-10-CM | POA: Diagnosis not present

## 2014-06-04 DIAGNOSIS — L8990 Pressure ulcer of unspecified site, unspecified stage: Secondary | ICD-10-CM | POA: Diagnosis not present

## 2014-06-04 DIAGNOSIS — D631 Anemia in chronic kidney disease: Secondary | ICD-10-CM | POA: Diagnosis not present

## 2014-06-04 DIAGNOSIS — N2581 Secondary hyperparathyroidism of renal origin: Secondary | ICD-10-CM | POA: Diagnosis not present

## 2014-06-04 DIAGNOSIS — Z7401 Bed confinement status: Secondary | ICD-10-CM | POA: Diagnosis not present

## 2014-06-04 DIAGNOSIS — D509 Iron deficiency anemia, unspecified: Secondary | ICD-10-CM | POA: Diagnosis not present

## 2014-06-04 DIAGNOSIS — R41841 Cognitive communication deficit: Secondary | ICD-10-CM | POA: Diagnosis not present

## 2014-06-04 DIAGNOSIS — R279 Unspecified lack of coordination: Secondary | ICD-10-CM | POA: Diagnosis not present

## 2014-06-04 DIAGNOSIS — L89150 Pressure ulcer of sacral region, unstageable: Secondary | ICD-10-CM | POA: Diagnosis not present

## 2014-06-04 DIAGNOSIS — R1311 Dysphagia, oral phase: Secondary | ICD-10-CM | POA: Diagnosis not present

## 2014-06-04 DIAGNOSIS — N186 End stage renal disease: Secondary | ICD-10-CM | POA: Diagnosis not present

## 2014-06-04 DIAGNOSIS — L89890 Pressure ulcer of other site, unstageable: Secondary | ICD-10-CM | POA: Diagnosis not present

## 2014-06-04 DIAGNOSIS — Z89512 Acquired absence of left leg below knee: Secondary | ICD-10-CM | POA: Diagnosis not present

## 2014-06-04 DIAGNOSIS — M869 Osteomyelitis, unspecified: Secondary | ICD-10-CM | POA: Diagnosis not present

## 2014-06-05 DIAGNOSIS — R41841 Cognitive communication deficit: Secondary | ICD-10-CM | POA: Diagnosis not present

## 2014-06-05 DIAGNOSIS — L89150 Pressure ulcer of sacral region, unstageable: Secondary | ICD-10-CM | POA: Diagnosis not present

## 2014-06-05 DIAGNOSIS — L89890 Pressure ulcer of other site, unstageable: Secondary | ICD-10-CM | POA: Diagnosis not present

## 2014-06-05 DIAGNOSIS — R1311 Dysphagia, oral phase: Secondary | ICD-10-CM | POA: Diagnosis not present

## 2014-06-05 DIAGNOSIS — M869 Osteomyelitis, unspecified: Secondary | ICD-10-CM | POA: Diagnosis not present

## 2014-06-05 DIAGNOSIS — Z89512 Acquired absence of left leg below knee: Secondary | ICD-10-CM | POA: Diagnosis not present

## 2014-06-06 DIAGNOSIS — N186 End stage renal disease: Secondary | ICD-10-CM | POA: Diagnosis not present

## 2014-06-06 DIAGNOSIS — D631 Anemia in chronic kidney disease: Secondary | ICD-10-CM | POA: Diagnosis not present

## 2014-06-06 DIAGNOSIS — Z7401 Bed confinement status: Secondary | ICD-10-CM | POA: Diagnosis not present

## 2014-06-06 DIAGNOSIS — L89150 Pressure ulcer of sacral region, unstageable: Secondary | ICD-10-CM | POA: Diagnosis not present

## 2014-06-06 DIAGNOSIS — R279 Unspecified lack of coordination: Secondary | ICD-10-CM | POA: Diagnosis not present

## 2014-06-06 DIAGNOSIS — N189 Chronic kidney disease, unspecified: Secondary | ICD-10-CM | POA: Diagnosis not present

## 2014-06-06 DIAGNOSIS — R1311 Dysphagia, oral phase: Secondary | ICD-10-CM | POA: Diagnosis not present

## 2014-06-06 DIAGNOSIS — D509 Iron deficiency anemia, unspecified: Secondary | ICD-10-CM | POA: Diagnosis not present

## 2014-06-06 DIAGNOSIS — L89890 Pressure ulcer of other site, unstageable: Secondary | ICD-10-CM | POA: Diagnosis not present

## 2014-06-06 DIAGNOSIS — R41841 Cognitive communication deficit: Secondary | ICD-10-CM | POA: Diagnosis not present

## 2014-06-06 DIAGNOSIS — Z89512 Acquired absence of left leg below knee: Secondary | ICD-10-CM | POA: Diagnosis not present

## 2014-06-06 DIAGNOSIS — M869 Osteomyelitis, unspecified: Secondary | ICD-10-CM | POA: Diagnosis not present

## 2014-06-06 DIAGNOSIS — N2581 Secondary hyperparathyroidism of renal origin: Secondary | ICD-10-CM | POA: Diagnosis not present

## 2014-06-07 DIAGNOSIS — M869 Osteomyelitis, unspecified: Secondary | ICD-10-CM | POA: Diagnosis not present

## 2014-06-07 DIAGNOSIS — R1311 Dysphagia, oral phase: Secondary | ICD-10-CM | POA: Diagnosis not present

## 2014-06-07 DIAGNOSIS — L89150 Pressure ulcer of sacral region, unstageable: Secondary | ICD-10-CM | POA: Diagnosis not present

## 2014-06-07 DIAGNOSIS — R41841 Cognitive communication deficit: Secondary | ICD-10-CM | POA: Diagnosis not present

## 2014-06-07 DIAGNOSIS — L89890 Pressure ulcer of other site, unstageable: Secondary | ICD-10-CM | POA: Diagnosis not present

## 2014-06-07 DIAGNOSIS — Z89512 Acquired absence of left leg below knee: Secondary | ICD-10-CM | POA: Diagnosis not present

## 2014-06-08 DIAGNOSIS — N186 End stage renal disease: Secondary | ICD-10-CM | POA: Diagnosis not present

## 2014-06-08 DIAGNOSIS — L89890 Pressure ulcer of other site, unstageable: Secondary | ICD-10-CM | POA: Diagnosis not present

## 2014-06-08 DIAGNOSIS — L8990 Pressure ulcer of unspecified site, unspecified stage: Secondary | ICD-10-CM | POA: Diagnosis not present

## 2014-06-08 DIAGNOSIS — N189 Chronic kidney disease, unspecified: Secondary | ICD-10-CM | POA: Diagnosis not present

## 2014-06-08 DIAGNOSIS — M869 Osteomyelitis, unspecified: Secondary | ICD-10-CM | POA: Diagnosis not present

## 2014-06-08 DIAGNOSIS — D509 Iron deficiency anemia, unspecified: Secondary | ICD-10-CM | POA: Diagnosis not present

## 2014-06-08 DIAGNOSIS — Z7401 Bed confinement status: Secondary | ICD-10-CM | POA: Diagnosis not present

## 2014-06-08 DIAGNOSIS — R1311 Dysphagia, oral phase: Secondary | ICD-10-CM | POA: Diagnosis not present

## 2014-06-08 DIAGNOSIS — D631 Anemia in chronic kidney disease: Secondary | ICD-10-CM | POA: Diagnosis not present

## 2014-06-08 DIAGNOSIS — N2581 Secondary hyperparathyroidism of renal origin: Secondary | ICD-10-CM | POA: Diagnosis not present

## 2014-06-08 DIAGNOSIS — Z89512 Acquired absence of left leg below knee: Secondary | ICD-10-CM | POA: Diagnosis not present

## 2014-06-08 DIAGNOSIS — L89150 Pressure ulcer of sacral region, unstageable: Secondary | ICD-10-CM | POA: Diagnosis not present

## 2014-06-08 DIAGNOSIS — R41841 Cognitive communication deficit: Secondary | ICD-10-CM | POA: Diagnosis not present

## 2014-06-11 DIAGNOSIS — Z7401 Bed confinement status: Secondary | ICD-10-CM | POA: Diagnosis not present

## 2014-06-11 DIAGNOSIS — N2581 Secondary hyperparathyroidism of renal origin: Secondary | ICD-10-CM | POA: Diagnosis not present

## 2014-06-11 DIAGNOSIS — L89150 Pressure ulcer of sacral region, unstageable: Secondary | ICD-10-CM | POA: Diagnosis not present

## 2014-06-11 DIAGNOSIS — D509 Iron deficiency anemia, unspecified: Secondary | ICD-10-CM | POA: Diagnosis not present

## 2014-06-11 DIAGNOSIS — L89890 Pressure ulcer of other site, unstageable: Secondary | ICD-10-CM | POA: Diagnosis not present

## 2014-06-11 DIAGNOSIS — L8990 Pressure ulcer of unspecified site, unspecified stage: Secondary | ICD-10-CM | POA: Diagnosis not present

## 2014-06-11 DIAGNOSIS — M869 Osteomyelitis, unspecified: Secondary | ICD-10-CM | POA: Diagnosis not present

## 2014-06-11 DIAGNOSIS — Z89512 Acquired absence of left leg below knee: Secondary | ICD-10-CM | POA: Diagnosis not present

## 2014-06-11 DIAGNOSIS — R41841 Cognitive communication deficit: Secondary | ICD-10-CM | POA: Diagnosis not present

## 2014-06-11 DIAGNOSIS — N186 End stage renal disease: Secondary | ICD-10-CM | POA: Diagnosis not present

## 2014-06-11 DIAGNOSIS — D631 Anemia in chronic kidney disease: Secondary | ICD-10-CM | POA: Diagnosis not present

## 2014-06-11 DIAGNOSIS — R1311 Dysphagia, oral phase: Secondary | ICD-10-CM | POA: Diagnosis not present

## 2014-06-12 DIAGNOSIS — Z89512 Acquired absence of left leg below knee: Secondary | ICD-10-CM | POA: Diagnosis not present

## 2014-06-12 DIAGNOSIS — L89150 Pressure ulcer of sacral region, unstageable: Secondary | ICD-10-CM | POA: Diagnosis not present

## 2014-06-12 DIAGNOSIS — R41841 Cognitive communication deficit: Secondary | ICD-10-CM | POA: Diagnosis not present

## 2014-06-12 DIAGNOSIS — M869 Osteomyelitis, unspecified: Secondary | ICD-10-CM | POA: Diagnosis not present

## 2014-06-12 DIAGNOSIS — L89890 Pressure ulcer of other site, unstageable: Secondary | ICD-10-CM | POA: Diagnosis not present

## 2014-06-12 DIAGNOSIS — R1311 Dysphagia, oral phase: Secondary | ICD-10-CM | POA: Diagnosis not present

## 2014-06-13 DIAGNOSIS — N189 Chronic kidney disease, unspecified: Secondary | ICD-10-CM | POA: Diagnosis not present

## 2014-06-13 DIAGNOSIS — N2581 Secondary hyperparathyroidism of renal origin: Secondary | ICD-10-CM | POA: Diagnosis not present

## 2014-06-13 DIAGNOSIS — D631 Anemia in chronic kidney disease: Secondary | ICD-10-CM | POA: Diagnosis not present

## 2014-06-13 DIAGNOSIS — R1311 Dysphagia, oral phase: Secondary | ICD-10-CM | POA: Diagnosis not present

## 2014-06-13 DIAGNOSIS — N186 End stage renal disease: Secondary | ICD-10-CM | POA: Diagnosis not present

## 2014-06-13 DIAGNOSIS — M869 Osteomyelitis, unspecified: Secondary | ICD-10-CM | POA: Diagnosis not present

## 2014-06-13 DIAGNOSIS — D509 Iron deficiency anemia, unspecified: Secondary | ICD-10-CM | POA: Diagnosis not present

## 2014-06-13 DIAGNOSIS — Z89512 Acquired absence of left leg below knee: Secondary | ICD-10-CM | POA: Diagnosis not present

## 2014-06-13 DIAGNOSIS — Z7401 Bed confinement status: Secondary | ICD-10-CM | POA: Diagnosis not present

## 2014-06-13 DIAGNOSIS — R41841 Cognitive communication deficit: Secondary | ICD-10-CM | POA: Diagnosis not present

## 2014-06-13 DIAGNOSIS — R279 Unspecified lack of coordination: Secondary | ICD-10-CM | POA: Diagnosis not present

## 2014-06-13 DIAGNOSIS — L89150 Pressure ulcer of sacral region, unstageable: Secondary | ICD-10-CM | POA: Diagnosis not present

## 2014-06-13 DIAGNOSIS — L8990 Pressure ulcer of unspecified site, unspecified stage: Secondary | ICD-10-CM | POA: Diagnosis not present

## 2014-06-13 DIAGNOSIS — L89890 Pressure ulcer of other site, unstageable: Secondary | ICD-10-CM | POA: Diagnosis not present

## 2014-06-14 DIAGNOSIS — R41841 Cognitive communication deficit: Secondary | ICD-10-CM | POA: Diagnosis not present

## 2014-06-14 DIAGNOSIS — L89150 Pressure ulcer of sacral region, unstageable: Secondary | ICD-10-CM | POA: Diagnosis not present

## 2014-06-14 DIAGNOSIS — L89890 Pressure ulcer of other site, unstageable: Secondary | ICD-10-CM | POA: Diagnosis not present

## 2014-06-14 DIAGNOSIS — Z89512 Acquired absence of left leg below knee: Secondary | ICD-10-CM | POA: Diagnosis not present

## 2014-06-14 DIAGNOSIS — R1311 Dysphagia, oral phase: Secondary | ICD-10-CM | POA: Diagnosis not present

## 2014-06-14 DIAGNOSIS — M869 Osteomyelitis, unspecified: Secondary | ICD-10-CM | POA: Diagnosis not present

## 2014-06-15 DIAGNOSIS — D631 Anemia in chronic kidney disease: Secondary | ICD-10-CM | POA: Diagnosis not present

## 2014-06-15 DIAGNOSIS — D509 Iron deficiency anemia, unspecified: Secondary | ICD-10-CM | POA: Diagnosis not present

## 2014-06-15 DIAGNOSIS — R41841 Cognitive communication deficit: Secondary | ICD-10-CM | POA: Diagnosis not present

## 2014-06-15 DIAGNOSIS — Z89512 Acquired absence of left leg below knee: Secondary | ICD-10-CM | POA: Diagnosis not present

## 2014-06-15 DIAGNOSIS — R1311 Dysphagia, oral phase: Secondary | ICD-10-CM | POA: Diagnosis not present

## 2014-06-15 DIAGNOSIS — Z7401 Bed confinement status: Secondary | ICD-10-CM | POA: Diagnosis not present

## 2014-06-15 DIAGNOSIS — L89890 Pressure ulcer of other site, unstageable: Secondary | ICD-10-CM | POA: Diagnosis not present

## 2014-06-15 DIAGNOSIS — N2581 Secondary hyperparathyroidism of renal origin: Secondary | ICD-10-CM | POA: Diagnosis not present

## 2014-06-15 DIAGNOSIS — L89150 Pressure ulcer of sacral region, unstageable: Secondary | ICD-10-CM | POA: Diagnosis not present

## 2014-06-15 DIAGNOSIS — R279 Unspecified lack of coordination: Secondary | ICD-10-CM | POA: Diagnosis not present

## 2014-06-15 DIAGNOSIS — N186 End stage renal disease: Secondary | ICD-10-CM | POA: Diagnosis not present

## 2014-06-15 DIAGNOSIS — M869 Osteomyelitis, unspecified: Secondary | ICD-10-CM | POA: Diagnosis not present

## 2014-06-17 DIAGNOSIS — D509 Iron deficiency anemia, unspecified: Secondary | ICD-10-CM | POA: Diagnosis not present

## 2014-06-17 DIAGNOSIS — N189 Chronic kidney disease, unspecified: Secondary | ICD-10-CM | POA: Diagnosis not present

## 2014-06-17 DIAGNOSIS — D631 Anemia in chronic kidney disease: Secondary | ICD-10-CM | POA: Diagnosis not present

## 2014-06-17 DIAGNOSIS — R41841 Cognitive communication deficit: Secondary | ICD-10-CM | POA: Diagnosis not present

## 2014-06-17 DIAGNOSIS — L89890 Pressure ulcer of other site, unstageable: Secondary | ICD-10-CM | POA: Diagnosis not present

## 2014-06-17 DIAGNOSIS — N2581 Secondary hyperparathyroidism of renal origin: Secondary | ICD-10-CM | POA: Diagnosis not present

## 2014-06-17 DIAGNOSIS — M869 Osteomyelitis, unspecified: Secondary | ICD-10-CM | POA: Diagnosis not present

## 2014-06-17 DIAGNOSIS — R279 Unspecified lack of coordination: Secondary | ICD-10-CM | POA: Diagnosis not present

## 2014-06-17 DIAGNOSIS — Z7401 Bed confinement status: Secondary | ICD-10-CM | POA: Diagnosis not present

## 2014-06-17 DIAGNOSIS — R1311 Dysphagia, oral phase: Secondary | ICD-10-CM | POA: Diagnosis not present

## 2014-06-17 DIAGNOSIS — N186 End stage renal disease: Secondary | ICD-10-CM | POA: Diagnosis not present

## 2014-06-17 DIAGNOSIS — Z89512 Acquired absence of left leg below knee: Secondary | ICD-10-CM | POA: Diagnosis not present

## 2014-06-17 DIAGNOSIS — L89150 Pressure ulcer of sacral region, unstageable: Secondary | ICD-10-CM | POA: Diagnosis not present

## 2014-06-18 DIAGNOSIS — E1040 Type 1 diabetes mellitus with diabetic neuropathy, unspecified: Secondary | ICD-10-CM | POA: Diagnosis not present

## 2014-06-18 DIAGNOSIS — Z992 Dependence on renal dialysis: Secondary | ICD-10-CM | POA: Diagnosis not present

## 2014-06-18 DIAGNOSIS — L89150 Pressure ulcer of sacral region, unstageable: Secondary | ICD-10-CM | POA: Diagnosis not present

## 2014-06-18 DIAGNOSIS — L89890 Pressure ulcer of other site, unstageable: Secondary | ICD-10-CM | POA: Diagnosis not present

## 2014-06-18 DIAGNOSIS — J860 Pyothorax with fistula: Secondary | ICD-10-CM | POA: Diagnosis not present

## 2014-06-18 DIAGNOSIS — E785 Hyperlipidemia, unspecified: Secondary | ICD-10-CM | POA: Diagnosis not present

## 2014-06-18 DIAGNOSIS — I10 Essential (primary) hypertension: Secondary | ICD-10-CM | POA: Diagnosis not present

## 2014-06-18 DIAGNOSIS — R41841 Cognitive communication deficit: Secondary | ICD-10-CM | POA: Diagnosis not present

## 2014-06-18 DIAGNOSIS — Z89512 Acquired absence of left leg below knee: Secondary | ICD-10-CM | POA: Diagnosis not present

## 2014-06-18 DIAGNOSIS — M869 Osteomyelitis, unspecified: Secondary | ICD-10-CM | POA: Diagnosis not present

## 2014-06-18 DIAGNOSIS — I6789 Other cerebrovascular disease: Secondary | ICD-10-CM | POA: Diagnosis not present

## 2014-06-18 DIAGNOSIS — R1311 Dysphagia, oral phase: Secondary | ICD-10-CM | POA: Diagnosis not present

## 2014-06-18 DIAGNOSIS — B1920 Unspecified viral hepatitis C without hepatic coma: Secondary | ICD-10-CM | POA: Diagnosis not present

## 2014-06-18 DIAGNOSIS — N186 End stage renal disease: Secondary | ICD-10-CM | POA: Diagnosis not present

## 2014-06-19 DIAGNOSIS — R279 Unspecified lack of coordination: Secondary | ICD-10-CM | POA: Diagnosis not present

## 2014-06-19 DIAGNOSIS — L89150 Pressure ulcer of sacral region, unstageable: Secondary | ICD-10-CM | POA: Diagnosis not present

## 2014-06-19 DIAGNOSIS — L89890 Pressure ulcer of other site, unstageable: Secondary | ICD-10-CM | POA: Diagnosis not present

## 2014-06-19 DIAGNOSIS — M869 Osteomyelitis, unspecified: Secondary | ICD-10-CM | POA: Diagnosis not present

## 2014-06-19 DIAGNOSIS — D509 Iron deficiency anemia, unspecified: Secondary | ICD-10-CM | POA: Diagnosis not present

## 2014-06-19 DIAGNOSIS — R1311 Dysphagia, oral phase: Secondary | ICD-10-CM | POA: Diagnosis not present

## 2014-06-19 DIAGNOSIS — R41841 Cognitive communication deficit: Secondary | ICD-10-CM | POA: Diagnosis not present

## 2014-06-19 DIAGNOSIS — N186 End stage renal disease: Secondary | ICD-10-CM | POA: Diagnosis not present

## 2014-06-19 DIAGNOSIS — D631 Anemia in chronic kidney disease: Secondary | ICD-10-CM | POA: Diagnosis not present

## 2014-06-19 DIAGNOSIS — Z89512 Acquired absence of left leg below knee: Secondary | ICD-10-CM | POA: Diagnosis not present

## 2014-06-19 DIAGNOSIS — Z7401 Bed confinement status: Secondary | ICD-10-CM | POA: Diagnosis not present

## 2014-06-19 DIAGNOSIS — N2581 Secondary hyperparathyroidism of renal origin: Secondary | ICD-10-CM | POA: Diagnosis not present

## 2014-06-19 DIAGNOSIS — N189 Chronic kidney disease, unspecified: Secondary | ICD-10-CM | POA: Diagnosis not present

## 2014-06-22 DIAGNOSIS — D631 Anemia in chronic kidney disease: Secondary | ICD-10-CM | POA: Diagnosis not present

## 2014-06-22 DIAGNOSIS — N2581 Secondary hyperparathyroidism of renal origin: Secondary | ICD-10-CM | POA: Diagnosis not present

## 2014-06-22 DIAGNOSIS — D509 Iron deficiency anemia, unspecified: Secondary | ICD-10-CM | POA: Diagnosis not present

## 2014-06-22 DIAGNOSIS — R1311 Dysphagia, oral phase: Secondary | ICD-10-CM | POA: Diagnosis not present

## 2014-06-22 DIAGNOSIS — N189 Chronic kidney disease, unspecified: Secondary | ICD-10-CM | POA: Diagnosis not present

## 2014-06-22 DIAGNOSIS — R41841 Cognitive communication deficit: Secondary | ICD-10-CM | POA: Diagnosis not present

## 2014-06-22 DIAGNOSIS — M869 Osteomyelitis, unspecified: Secondary | ICD-10-CM | POA: Diagnosis not present

## 2014-06-22 DIAGNOSIS — Z89512 Acquired absence of left leg below knee: Secondary | ICD-10-CM | POA: Diagnosis not present

## 2014-06-22 DIAGNOSIS — N186 End stage renal disease: Secondary | ICD-10-CM | POA: Diagnosis not present

## 2014-06-22 DIAGNOSIS — L89150 Pressure ulcer of sacral region, unstageable: Secondary | ICD-10-CM | POA: Diagnosis not present

## 2014-06-22 DIAGNOSIS — L89890 Pressure ulcer of other site, unstageable: Secondary | ICD-10-CM | POA: Diagnosis not present

## 2014-06-22 DIAGNOSIS — Z7401 Bed confinement status: Secondary | ICD-10-CM | POA: Diagnosis not present

## 2014-06-22 DIAGNOSIS — R279 Unspecified lack of coordination: Secondary | ICD-10-CM | POA: Diagnosis not present

## 2014-06-25 DIAGNOSIS — Z7401 Bed confinement status: Secondary | ICD-10-CM | POA: Diagnosis not present

## 2014-06-25 DIAGNOSIS — D509 Iron deficiency anemia, unspecified: Secondary | ICD-10-CM | POA: Diagnosis not present

## 2014-06-25 DIAGNOSIS — N186 End stage renal disease: Secondary | ICD-10-CM | POA: Diagnosis not present

## 2014-06-25 DIAGNOSIS — N2581 Secondary hyperparathyroidism of renal origin: Secondary | ICD-10-CM | POA: Diagnosis not present

## 2014-06-25 DIAGNOSIS — N189 Chronic kidney disease, unspecified: Secondary | ICD-10-CM | POA: Diagnosis not present

## 2014-06-25 DIAGNOSIS — R279 Unspecified lack of coordination: Secondary | ICD-10-CM | POA: Diagnosis not present

## 2014-06-25 DIAGNOSIS — D631 Anemia in chronic kidney disease: Secondary | ICD-10-CM | POA: Diagnosis not present

## 2014-06-27 DIAGNOSIS — E119 Type 2 diabetes mellitus without complications: Secondary | ICD-10-CM | POA: Diagnosis not present

## 2014-06-27 DIAGNOSIS — D631 Anemia in chronic kidney disease: Secondary | ICD-10-CM | POA: Diagnosis not present

## 2014-06-27 DIAGNOSIS — N2581 Secondary hyperparathyroidism of renal origin: Secondary | ICD-10-CM | POA: Diagnosis not present

## 2014-06-27 DIAGNOSIS — N186 End stage renal disease: Secondary | ICD-10-CM | POA: Diagnosis not present

## 2014-06-27 DIAGNOSIS — D509 Iron deficiency anemia, unspecified: Secondary | ICD-10-CM | POA: Diagnosis not present

## 2014-06-27 DIAGNOSIS — Z125 Encounter for screening for malignant neoplasm of prostate: Secondary | ICD-10-CM | POA: Diagnosis not present

## 2014-06-27 DIAGNOSIS — Z114 Encounter for screening for human immunodeficiency virus [HIV]: Secondary | ICD-10-CM | POA: Diagnosis not present

## 2014-07-02 DIAGNOSIS — N2581 Secondary hyperparathyroidism of renal origin: Secondary | ICD-10-CM | POA: Diagnosis not present

## 2014-07-02 DIAGNOSIS — D509 Iron deficiency anemia, unspecified: Secondary | ICD-10-CM | POA: Diagnosis not present

## 2014-07-02 DIAGNOSIS — D631 Anemia in chronic kidney disease: Secondary | ICD-10-CM | POA: Diagnosis not present

## 2014-07-02 DIAGNOSIS — N186 End stage renal disease: Secondary | ICD-10-CM | POA: Diagnosis not present

## 2014-07-04 DIAGNOSIS — D631 Anemia in chronic kidney disease: Secondary | ICD-10-CM | POA: Diagnosis not present

## 2014-07-04 DIAGNOSIS — D509 Iron deficiency anemia, unspecified: Secondary | ICD-10-CM | POA: Diagnosis not present

## 2014-07-04 DIAGNOSIS — N186 End stage renal disease: Secondary | ICD-10-CM | POA: Diagnosis not present

## 2014-07-04 DIAGNOSIS — N2581 Secondary hyperparathyroidism of renal origin: Secondary | ICD-10-CM | POA: Diagnosis not present

## 2014-07-06 DIAGNOSIS — D509 Iron deficiency anemia, unspecified: Secondary | ICD-10-CM | POA: Diagnosis not present

## 2014-07-06 DIAGNOSIS — N186 End stage renal disease: Secondary | ICD-10-CM | POA: Diagnosis not present

## 2014-07-06 DIAGNOSIS — N2581 Secondary hyperparathyroidism of renal origin: Secondary | ICD-10-CM | POA: Diagnosis not present

## 2014-07-06 DIAGNOSIS — D631 Anemia in chronic kidney disease: Secondary | ICD-10-CM | POA: Diagnosis not present

## 2014-07-09 DIAGNOSIS — N2581 Secondary hyperparathyroidism of renal origin: Secondary | ICD-10-CM | POA: Diagnosis not present

## 2014-07-09 DIAGNOSIS — D509 Iron deficiency anemia, unspecified: Secondary | ICD-10-CM | POA: Diagnosis not present

## 2014-07-09 DIAGNOSIS — D631 Anemia in chronic kidney disease: Secondary | ICD-10-CM | POA: Diagnosis not present

## 2014-07-09 DIAGNOSIS — N186 End stage renal disease: Secondary | ICD-10-CM | POA: Diagnosis not present

## 2014-07-11 DIAGNOSIS — D509 Iron deficiency anemia, unspecified: Secondary | ICD-10-CM | POA: Diagnosis not present

## 2014-07-11 DIAGNOSIS — D631 Anemia in chronic kidney disease: Secondary | ICD-10-CM | POA: Diagnosis not present

## 2014-07-11 DIAGNOSIS — N2581 Secondary hyperparathyroidism of renal origin: Secondary | ICD-10-CM | POA: Diagnosis not present

## 2014-07-11 DIAGNOSIS — N186 End stage renal disease: Secondary | ICD-10-CM | POA: Diagnosis not present

## 2014-07-13 DIAGNOSIS — D509 Iron deficiency anemia, unspecified: Secondary | ICD-10-CM | POA: Diagnosis not present

## 2014-07-13 DIAGNOSIS — N2581 Secondary hyperparathyroidism of renal origin: Secondary | ICD-10-CM | POA: Diagnosis not present

## 2014-07-13 DIAGNOSIS — D631 Anemia in chronic kidney disease: Secondary | ICD-10-CM | POA: Diagnosis not present

## 2014-07-13 DIAGNOSIS — N186 End stage renal disease: Secondary | ICD-10-CM | POA: Diagnosis not present

## 2014-07-16 DIAGNOSIS — E1040 Type 1 diabetes mellitus with diabetic neuropathy, unspecified: Secondary | ICD-10-CM | POA: Diagnosis not present

## 2014-07-16 DIAGNOSIS — E785 Hyperlipidemia, unspecified: Secondary | ICD-10-CM | POA: Diagnosis not present

## 2014-07-16 DIAGNOSIS — D631 Anemia in chronic kidney disease: Secondary | ICD-10-CM | POA: Diagnosis not present

## 2014-07-16 DIAGNOSIS — Z992 Dependence on renal dialysis: Secondary | ICD-10-CM | POA: Diagnosis not present

## 2014-07-16 DIAGNOSIS — N2581 Secondary hyperparathyroidism of renal origin: Secondary | ICD-10-CM | POA: Diagnosis not present

## 2014-07-16 DIAGNOSIS — D509 Iron deficiency anemia, unspecified: Secondary | ICD-10-CM | POA: Diagnosis not present

## 2014-07-16 DIAGNOSIS — I10 Essential (primary) hypertension: Secondary | ICD-10-CM | POA: Diagnosis not present

## 2014-07-16 DIAGNOSIS — N186 End stage renal disease: Secondary | ICD-10-CM | POA: Diagnosis not present

## 2014-07-18 DIAGNOSIS — D631 Anemia in chronic kidney disease: Secondary | ICD-10-CM | POA: Diagnosis not present

## 2014-07-18 DIAGNOSIS — N2581 Secondary hyperparathyroidism of renal origin: Secondary | ICD-10-CM | POA: Diagnosis not present

## 2014-07-18 DIAGNOSIS — D509 Iron deficiency anemia, unspecified: Secondary | ICD-10-CM | POA: Diagnosis not present

## 2014-07-18 DIAGNOSIS — N186 End stage renal disease: Secondary | ICD-10-CM | POA: Diagnosis not present

## 2014-07-21 ENCOUNTER — Encounter (HOSPITAL_COMMUNITY): Payer: Self-pay | Admitting: *Deleted

## 2014-07-21 ENCOUNTER — Emergency Department (HOSPITAL_COMMUNITY)
Admission: EM | Admit: 2014-07-21 | Discharge: 2014-07-21 | Disposition: A | Discharge status: Home / Self Care | Payer: Medicare Other | Attending: Emergency Medicine | Admitting: Emergency Medicine

## 2014-07-21 DIAGNOSIS — Z79899 Other long term (current) drug therapy: Secondary | ICD-10-CM | POA: Insufficient documentation

## 2014-07-21 DIAGNOSIS — R197 Diarrhea, unspecified: Secondary | ICD-10-CM | POA: Diagnosis not present

## 2014-07-21 DIAGNOSIS — Z8673 Personal history of transient ischemic attack (TIA), and cerebral infarction without residual deficits: Secondary | ICD-10-CM | POA: Insufficient documentation

## 2014-07-21 DIAGNOSIS — Z792 Long term (current) use of antibiotics: Secondary | ICD-10-CM | POA: Insufficient documentation

## 2014-07-21 DIAGNOSIS — E119 Type 2 diabetes mellitus without complications: Secondary | ICD-10-CM | POA: Diagnosis not present

## 2014-07-21 DIAGNOSIS — Z7902 Long term (current) use of antithrombotics/antiplatelets: Secondary | ICD-10-CM | POA: Insufficient documentation

## 2014-07-21 DIAGNOSIS — I509 Heart failure, unspecified: Secondary | ICD-10-CM | POA: Diagnosis not present

## 2014-07-21 DIAGNOSIS — Z992 Dependence on renal dialysis: Secondary | ICD-10-CM | POA: Diagnosis not present

## 2014-07-21 DIAGNOSIS — K219 Gastro-esophageal reflux disease without esophagitis: Secondary | ICD-10-CM | POA: Insufficient documentation

## 2014-07-21 DIAGNOSIS — G8929 Other chronic pain: Secondary | ICD-10-CM | POA: Diagnosis not present

## 2014-07-21 DIAGNOSIS — Z8781 Personal history of (healed) traumatic fracture: Secondary | ICD-10-CM | POA: Diagnosis not present

## 2014-07-21 DIAGNOSIS — N186 End stage renal disease: Secondary | ICD-10-CM | POA: Insufficient documentation

## 2014-07-21 DIAGNOSIS — F329 Major depressive disorder, single episode, unspecified: Secondary | ICD-10-CM | POA: Insufficient documentation

## 2014-07-21 DIAGNOSIS — I120 Hypertensive chronic kidney disease with stage 5 chronic kidney disease or end stage renal disease: Secondary | ICD-10-CM | POA: Diagnosis not present

## 2014-07-21 DIAGNOSIS — Z87442 Personal history of urinary calculi: Secondary | ICD-10-CM | POA: Diagnosis not present

## 2014-07-21 DIAGNOSIS — Z8619 Personal history of other infectious and parasitic diseases: Secondary | ICD-10-CM | POA: Diagnosis not present

## 2014-07-21 LAB — CBC WITH DIFFERENTIAL/PLATELET
BASOS ABS: 0 10*3/uL (ref 0.0–0.1)
Basophils Relative: 1 % (ref 0–1)
Eosinophils Absolute: 0.1 10*3/uL (ref 0.0–0.7)
Eosinophils Relative: 2 % (ref 0–5)
HEMATOCRIT: 30.8 % — AB (ref 39.0–52.0)
Hemoglobin: 10.3 g/dL — ABNORMAL LOW (ref 13.0–17.0)
LYMPHS PCT: 38 % (ref 12–46)
Lymphs Abs: 2.3 10*3/uL (ref 0.7–4.0)
MCH: 30.2 pg (ref 26.0–34.0)
MCHC: 33.4 g/dL (ref 30.0–36.0)
MCV: 90.3 fL (ref 78.0–100.0)
Monocytes Absolute: 0.4 10*3/uL (ref 0.1–1.0)
Monocytes Relative: 7 % (ref 3–12)
NEUTROS ABS: 3.2 10*3/uL (ref 1.7–7.7)
Neutrophils Relative %: 52 % (ref 43–77)
PLATELETS: 286 10*3/uL (ref 150–400)
RBC: 3.41 MIL/uL — ABNORMAL LOW (ref 4.22–5.81)
RDW: 14.4 % (ref 11.5–15.5)
WBC: 6 10*3/uL (ref 4.0–10.5)

## 2014-07-21 LAB — COMPREHENSIVE METABOLIC PANEL
ALT: 26 U/L (ref 0–53)
AST: 25 U/L (ref 0–37)
Albumin: 3.5 g/dL (ref 3.5–5.2)
Alkaline Phosphatase: 91 U/L (ref 39–117)
Anion gap: 14 (ref 5–15)
BILIRUBIN TOTAL: 0.4 mg/dL (ref 0.3–1.2)
BUN: 84 mg/dL — ABNORMAL HIGH (ref 6–23)
CHLORIDE: 101 meq/L (ref 96–112)
CO2: 24 mmol/L (ref 19–32)
Calcium: 9.4 mg/dL (ref 8.4–10.5)
Creatinine, Ser: 9.65 mg/dL — ABNORMAL HIGH (ref 0.50–1.35)
GFR calc Af Amer: 6 mL/min — ABNORMAL LOW (ref >90)
GFR calc non Af Amer: 5 mL/min — ABNORMAL LOW (ref >90)
Glucose, Bld: 98 mg/dL (ref 70–99)
Potassium: 5.1 mmol/L (ref 3.5–5.1)
Sodium: 139 mmol/L (ref 135–145)
Total Protein: 8 g/dL (ref 6.0–8.3)

## 2014-07-21 MED ORDER — LOPERAMIDE HCL 2 MG PO CAPS
2.0000 mg | ORAL_CAPSULE | ORAL | Status: DC | PRN
  Administered 2014-07-21: 2 mg via ORAL
  Filled 2014-07-21: qty 1

## 2014-07-21 MED ORDER — SODIUM CHLORIDE 0.9 % IV SOLN
INTRAVENOUS | Status: DC
  Administered 2014-07-21: 20 mL/h via INTRAVENOUS

## 2014-07-21 MED ORDER — DIPHENOXYLATE-ATROPINE 2.5-0.025 MG PO TABS: 1.0000 | ORAL_TABLET | Freq: Four times a day (QID) | ORAL | Status: DC | PRN

## 2014-07-21 NOTE — ED Notes (Signed)
Wife asked if G-tube could be removed si nce pt has been eating since May. States [facility] is [facility] to come to house, has been assigned since Nov 30th-- wife calls every week-- no one has visited yet.

## 2014-07-21 NOTE — Discharge Instructions (Signed)

## 2014-07-21 NOTE — ED Provider Notes (Addendum)
CSN: [id number]     Arrival date & time 07/21/14  6314 History   First MD Initiated Contact with Patient 07/21/14 [phone]     Chief Complaint  Patient presents with  . Diarrhea     (Consider location/radiation/quality/duration/timing/severity/associated sxs/prior Treatment) HPI Comments: Patient here with diarrhea since 2:00 this morning characterized as loose stool without blood. History of C. difficile in the past and this is similar. Patient states that he has had approximately 3 stools. He is currently not on any antibiotics or has taken any recently. Denies abdominal pain. No fever or chills. No vomiting noted. He is a dialysis patient was scheduled for dialysis today and denies being short of breath. Symptoms persistent and no treatment use prior to arrival.  Patient is a 63 y.o. male presenting with diarrhea. The history is provided by the patient and the spouse.  Diarrhea   Past Medical History  Diagnosis Date  . ESRD on hemodialysis 05/05/2007    ESRD due to DM/HTN. Started dialysis in November 2013.  HD TTS at [facility] on [place].  [name] BACK PAIN, LUMBAR, CHRONIC 08/06/2009  . BENIGN PROSTATIC HYPERTROPHY 08/01/2010  . CEREBROVASCULAR ACCIDENT, HX OF 08/06/2009  . CHOLELITHIASIS 08/01/2010  . CONGESTIVE HEART FAILURE 03/18/2009  . DEPRESSION 03/18/2009  . DIABETES MELLITUS, TYPE II 03/25/2007  . ERECTILE DYSFUNCTION 03/25/2007  . GERD 03/25/2007  . HEPATITIS C, HX OF 03/25/2007  . HYPERTENSION 03/25/2007  . Morbid obesity 03/25/2007  . NEPHROLITHIASIS, HX OF 03/18/2009  . Complication of anesthesia     wife states pt had trouble waking up with his last surgery in Nov., 2014   Past Surgical History  Procedure Laterality Date  . Nephrectomy      partial RR  . Av fistula placement  06/14/2012    Procedure: ARTERIOVENOUS (AV) FISTULA CREATION;  Surgeon: Angelia Mould, MD;  Location: [facility] OR;  Service: Vascular;  Laterality: Left;  Left basilic vein transposition with fistula.   . Tibia im nail insertion Left 09/09/2012    Procedure: INTRAMEDULLARY (IM) NAIL TIBIAL;  Surgeon: Johnny Bridge, MD;  Location: [place];  Service: Orthopedics;  Laterality: Left;  left tibial nail and open reduction internal fixation left fibula fracture  . Orif fibula fracture Left 09/09/2012    Procedure: OPEN REDUCTION INTERNAL FIXATION (ORIF) FIBULA FRACTURE;  Surgeon: Johnny Bridge, MD;  Location: [place];  Service: Orthopedics;  Laterality: Left;  . Colonoscopy N/A 10/28/2012    Procedure: COLONOSCOPY;  Surgeon: Jeryl Columbia, MD;  Location: [facility] ENDOSCOPY;  Service: Endoscopy;  Laterality: N/A;  . Esophagogastroduodenoscopy N/A 11/02/2012    Procedure: ESOPHAGOGASTRODUODENOSCOPY (EGD);  Surgeon: Cleotis Nipper, MD;  Location: [facility] ENDOSCOPY;  Service: Endoscopy;  Laterality: N/A;  . Colonoscopy N/A 11/02/2012    Procedure: COLONOSCOPY;  Surgeon: Cleotis Nipper, MD;  Location: [facility] ENDOSCOPY;  Service: Endoscopy;  Laterality: N/A;  . Colonoscopy N/A 11/03/2012    Procedure: COLONOSCOPY;  Surgeon: Cleotis Nipper, MD;  Location: [facility] ENDOSCOPY;  Service: Endoscopy;  Laterality: N/A;  . Givens capsule study N/A 11/04/2012    Procedure: GIVENS CAPSULE STUDY;  Surgeon: Cleotis Nipper, MD;  Location: [facility] ENDOSCOPY;  Service: Endoscopy;  Laterality: N/A;  . Enteroscopy N/A 11/08/2012    Procedure: ENTEROSCOPY;  Surgeon: Wonda Horner, MD;  Location: [facility] ENDOSCOPY;  Service: Endoscopy;  Laterality: N/A;  . Amputation Left 05/12/2013    Procedure: AMPUTATION RAY;  Surgeon: Newt Minion, MD;  Location: [place];  Service: Orthopedics;  Laterality: Left;  Left Foot 1st Ray Amputation  . Eye surgery Left     to remove scar tissue  . Amputation Left 06/09/2013    Procedure: AMPUTATION BELOW KNEE;  Surgeon: Newt Minion, MD;  Location: [place];  Service: Orthopedics;  Laterality: Left;  Left Below Knee Amputation and removal proximal screws IM tibial nail  . Hardware removal Left 06/09/2013    Procedure: HARDWARE  REMOVAL;  Surgeon: Newt Minion, MD;  Location: [place];  Service: Orthopedics;  Laterality: Left;  Left Below Knee Amputation  and Removal proximal screws IM tibial nail  . Amputation Right 09/08/2013    Procedure: AMPUTATION BELOW KNEE;  Surgeon: Newt Minion, MD;  Location: [place];  Service: Orthopedics;  Laterality: Right;  Right Below Knee Amputation  . Amputation Right 10/11/2013    Procedure: AMPUTATION BELOW KNEE;  Surgeon: Newt Minion, MD;  Location: [place];  Service: Orthopedics;  Laterality: Right;  Right Below Knee Amputation Revision   Family History  Problem Relation Age of Onset  . Coronary artery disease Other   . Diabetes Other    History  Substance Use Topics  . Smoking status: Never Smoker   . Smokeless tobacco: Never Used  . Alcohol Use: No    Review of Systems  Gastrointestinal: Positive for diarrhea.      Allergies  Review of patient's allergies indicates no known allergies.  Home Medications   Prior to Admission medications   Medication Sig Start Date End Date Taking? Authorizing Provider  clopidogrel (PLAVIX) 75 MG tablet Take 75 mg by mouth daily.   Yes Historical Provider, MD  HYDROcodone-acetaminophen (NORCO/VICODIN) 5-325 MG per tablet Take 1 tablet by mouth every 6 (six) hours as needed for moderate pain.   Yes Historical Provider, MD  multivitamin (RENA-VIT) TABS tablet Take 1 tablet by mouth at bedtime. Patient taking differently: Take 2 tablets by mouth at bedtime.  10/31/13  Yes Belkys A Regalado, MD  sevelamer carbonate (RENVELA) 800 MG tablet Take 1,600 mg by mouth 3 (three) times daily with meals.   Yes Historical Provider, MD  acetaminophen (TYLENOL) 500 MG tablet Take 500 mg by mouth every 6 (six) hours as needed for mild pain.    Historical Provider, MD  Amino Acids-Protein Hydrolys (FEEDING SUPPLEMENT, PRO-STAT SUGAR FREE 64,) LIQD Place 30 mLs into feeding tube daily at 12 noon. 10/31/13   Belkys A Regalado, MD  amLODipine (NORVASC) 10 MG  tablet Take 10 mg by mouth daily.    Historical Provider, MD  Ascorbic Acid (VITAMIN C PO) Take 1 tablet by mouth daily.    Historical Provider, MD  bisacodyl (DULCOLAX) 5 MG EC tablet Take 1 tablet (5 mg total) by mouth 2 (two) times daily. 10/31/13   Belkys A Regalado, MD  brimonidine (ALPHAGAN) 0.2 % ophthalmic solution Place 1 drop into both eyes 2 (two) times daily.    Historical Provider, MD  Brinzolamide-Brimonidine [facility]) 1-0.2 % SUSP Place 1 drop into both eyes 2 (two) times daily.     Historical Provider, MD  calcium acetate (PHOSLO) 667 MG capsule Take 2 capsules (1,334 mg total) by mouth 3 (three) times daily with meals. 06/21/12   Sorin June Leap, MD  FLUoxetine (PROZAC) 20 MG/5ML solution Place 5 mLs (20 mg total) into feeding tube daily. 10/31/13   Belkys A Regalado, MD  gabapentin (NEURONTIN) 300 MG capsule Take 300 mg by mouth daily as needed (for mild to moderate pain).  Historical Provider, MD  haloperidol (HALDOL) 5 MG tablet Take 5 mg by mouth at bedtime as needed for agitation.    Historical Provider, MD  labetalol (NORMODYNE) 300 MG tablet Take 1 tablet (300 mg total) by mouth 3 (three) times daily. 05/15/13   Melton Alar, PA-C  neomycin-bacitracin-polymyxin (NEOSPORIN) OINT Apply 1 application topically 3 (three) times daily. 10/31/13   Belkys A Regalado, MD  Nutritional Supplements (FEEDING SUPPLEMENT, NEPRO CARB STEADY,) LIQD Place 1,000 mLs into feeding tube continuous. 10/31/13   Belkys A Regalado, MD  saccharomyces boulardii (FLORASTOR) 250 MG capsule Take 250 mg by mouth 2 (two) times daily.    Historical Provider, MD  sertraline (ZOLOFT) 100 MG tablet Take 100 mg by mouth daily as needed. For depression    Historical Provider, MD  silver sulfADIAZINE (SILVADENE) 1 % cream Apply 1 application topically daily.    Historical Provider, MD  Travoprost, BAK Free, (TRAVATAN) 0.004 % SOLN ophthalmic solution Place 1 drop into both eyes at bedtime. 10/30/12   Belkys A Regalado, MD   traZODone (DESYREL) 50 MG tablet Take 50 mg by mouth at bedtime as needed for sleep.    Historical Provider, MD  Water For Irrigation, Sterile (FREE WATER) SOLN Place 175 mLs into feeding tube 4 (four) times daily. 10/31/13   Belkys A Regalado, MD   BP 124/69 mmHg  Pulse 81  Temp(Src) 98.4 F (36.9 C) (Oral)  Resp 18  Wt 171 lb (77.565 kg)  SpO2 100% Physical Exam  Constitutional: He is oriented to person, place, and time. He appears well-developed and well-nourished.  Non-toxic appearance. No distress.  HENT:  Head: Normocephalic and atraumatic.  Eyes: Conjunctivae, EOM and lids are normal. Pupils are equal, round, and reactive to light.  Neck: Normal range of motion. Neck supple. No tracheal deviation present. No thyroid mass present.  Cardiovascular: Normal rate, regular rhythm and normal heart sounds.  Exam reveals no gallop.   No murmur heard. Pulmonary/Chest: Effort normal and breath sounds normal. No stridor. No respiratory distress. He has no decreased breath sounds. He has no wheezes. He has no rhonchi. He has no rales.  Abdominal: Soft. Normal appearance and bowel sounds are normal. He exhibits no distension. There is no tenderness. There is no rebound and no CVA tenderness.  Musculoskeletal: Normal range of motion. He exhibits no edema or tenderness.  Neurological: He is alert and oriented to person, place, and time. He has normal strength. No cranial nerve deficit or sensory deficit. GCS eye subscore is 4. GCS verbal subscore is 5. GCS motor subscore is 6.  Skin: Skin is warm and dry. No abrasion and no rash noted.  Psychiatric: He has a normal mood and affect. His speech is normal and behavior is normal.  Nursing note and vitals reviewed.   ED Course  Procedures (including critical care time) Labs Review Labs Reviewed - No data to display  Imaging Review No results found.   EKG Interpretation None      MDM   Final diagnoses:  None   Patient given Lomotil here  for his diarrhea. Unable to give a stool sample at this time to be tested for C. difficile though he currently does not have any use of antibiotics. Have scheduled the patient is G-tube to be removed next week per his request. He will follow-up in dialysis next week     Leota Jacobsen, MD 07/21/14 1245  Leota Jacobsen, MD 08/10/14 1316

## 2014-07-21 NOTE — ED Notes (Signed)
Pt. Has had 4 episodes of diarrhea since 2am. Pt. Is a dialysis pt. And was on the way to dialysis when they decided to come to the ED instead.

## 2014-07-23 DIAGNOSIS — N186 End stage renal disease: Secondary | ICD-10-CM | POA: Diagnosis not present

## 2014-07-23 DIAGNOSIS — D509 Iron deficiency anemia, unspecified: Secondary | ICD-10-CM | POA: Diagnosis not present

## 2014-07-23 DIAGNOSIS — D631 Anemia in chronic kidney disease: Secondary | ICD-10-CM | POA: Diagnosis not present

## 2014-07-23 DIAGNOSIS — N2581 Secondary hyperparathyroidism of renal origin: Secondary | ICD-10-CM | POA: Diagnosis not present

## 2014-07-23 LAB — GI PATHOGEN PANEL BY PCR, STOOL
C difficile toxin A/B: POSITIVE
Campylobacter by PCR: NEGATIVE
Cryptosporidium by PCR: NEGATIVE
E COLI (STEC): NEGATIVE
E coli (ETEC) LT/ST: NEGATIVE
E coli 0157 by PCR: NEGATIVE
G lamblia by PCR: NEGATIVE
NOROVIRUS G1/G2: NEGATIVE
ROTAVIRUS A BY PCR: NEGATIVE
SALMONELLA BY PCR: NEGATIVE
Shigella by PCR: NEGATIVE

## 2014-07-24 ENCOUNTER — Ambulatory Visit (INDEPENDENT_AMBULATORY_CARE_PROVIDER_SITE_OTHER): Payer: Medicare Other | Admitting: Internal Medicine

## 2014-07-24 ENCOUNTER — Encounter: Payer: Self-pay | Admitting: Internal Medicine

## 2014-07-24 VITALS — BP 114/72 | HR 82 | Temp 98.7°F

## 2014-07-24 DIAGNOSIS — F32A Depression, unspecified: Secondary | ICD-10-CM

## 2014-07-24 DIAGNOSIS — I10 Essential (primary) hypertension: Secondary | ICD-10-CM

## 2014-07-24 DIAGNOSIS — I5042 Chronic combined systolic (congestive) and diastolic (congestive) heart failure: Secondary | ICD-10-CM | POA: Diagnosis not present

## 2014-07-24 DIAGNOSIS — F329 Major depressive disorder, single episode, unspecified: Secondary | ICD-10-CM | POA: Diagnosis not present

## 2014-07-24 DIAGNOSIS — I639 Cerebral infarction, unspecified: Secondary | ICD-10-CM

## 2014-07-24 MED ORDER — AMLODIPINE BESYLATE 10 MG PO TABS: 10.0000 mg | ORAL_TABLET | Freq: Every day | ORAL | Status: DC

## 2014-07-24 MED ORDER — CLOPIDOGREL BISULFATE 75 MG PO TABS: 75.0000 mg | ORAL_TABLET | Freq: Every day | ORAL | Status: DC

## 2014-07-24 MED ORDER — SERTRALINE HCL 100 MG PO TABS: 100.0000 mg | ORAL_TABLET | Freq: Every day | ORAL | Status: DC | PRN

## 2014-07-24 MED ORDER — LABETALOL HCL 300 MG PO TABS: 300.0000 mg | ORAL_TABLET | Freq: Three times a day (TID) | ORAL | Status: DC

## 2014-07-24 NOTE — Assessment & Plan Note (Signed)
stable overall by history and exam, and pt to continue medical treatment as before,  to f/u any worsening symptoms or concerns 

## 2014-07-24 NOTE — Assessment & Plan Note (Signed)
stable overall by history and exam, recent data reviewed with pt, and pt to continue medical treatment as before,  to f/u any worsening symptoms or concerns Lab Results  Component Value Date   WBC 6.0 07/21/2014   HGB 10.3* 07/21/2014   HCT 30.8* 07/21/2014   PLT 286 07/21/2014   GLUCOSE 98 07/21/2014   CHOL 127 10/07/2013   TRIG 133 10/07/2013   HDL 24* 10/07/2013   LDLCALC 76 10/07/2013   ALT 26 07/21/2014   AST 25 07/21/2014   NA 139 07/21/2014   K 5.1 07/21/2014   CL 101 07/21/2014   CREATININE 9.65* 07/21/2014   BUN 84* 07/21/2014   CO2 24 07/21/2014   TSH 1.028 10/13/2013   PSA 0.60 05/06/2011   INR 1.02 10/23/2013   HGBA1C 7.8* 09/11/2012   MICROALBUR 220.0* 08/06/2009

## 2014-07-24 NOTE — Addendum Note (Signed)
Addended by: Sharon Seller B on: 07/24/2014 06:17 PM   Modules accepted: Orders

## 2014-07-24 NOTE — Progress Notes (Signed)
Subjective:    Patient ID: Oscar Castillo, male    DOB: 1950/08/17, 63 y.o.   MRN: [id number]  HPI  Here to f/u; overall doing ok,  Pt denies chest pain, increased sob or doe, wheezing, orthopnea, PND, increased LE swelling, palpitations, dizziness or syncope.  Pt denies polydipsia, polyuria, or low sugar symptoms such as weakness or confusion improved with po intake.  Pt denies new neurological symptoms such as new headache, or facial or extremity weakness or numbness.   Pt states overall good compliance with meds. Now s/p bilat BKA with extensive rehab mult facilities since feb 2013, had 3 episodes c diff.  , now resolved.  Denies worsening reflux, abd pain, dysphagia, n/v, bowel change or blood.   PT to see at home to work on ambulation with his bilat prosthesis.  On HD M-W-F, per Dr Moshe Cipro.  Denies worsening depressive symptoms, suicidal ideation, or panic; has ongoing anxiety, not increased recently, trying not take the benzo prn.  Did have episode diarrhea x 1 last wk, but none since.  Also episode vomit after HD a few days ago,  But none since Past Medical History  Diagnosis Date  . ESRD on hemodialysis 05/05/2007    ESRD due to DM/HTN. Started dialysis in November 2013.  HD TTS at [facility] on [place].  [name] BACK PAIN, LUMBAR, CHRONIC 08/06/2009  . BENIGN PROSTATIC HYPERTROPHY 08/01/2010  . CEREBROVASCULAR ACCIDENT, HX OF 08/06/2009  . CHOLELITHIASIS 08/01/2010  . CONGESTIVE HEART FAILURE 03/18/2009  . DEPRESSION 03/18/2009  . DIABETES MELLITUS, TYPE II 03/25/2007  . ERECTILE DYSFUNCTION 03/25/2007  . GERD 03/25/2007  . HEPATITIS C, HX OF 03/25/2007  . HYPERTENSION 03/25/2007  . Morbid obesity 03/25/2007  . NEPHROLITHIASIS, HX OF 03/18/2009  . Complication of anesthesia     wife states pt had trouble waking up with his last surgery in Nov., 2014   Past Surgical History  Procedure Laterality Date  . Nephrectomy      partial RR  . Av fistula placement  06/14/2012    Procedure:  ARTERIOVENOUS (AV) FISTULA CREATION;  Surgeon: Angelia Mould, MD;  Location: [facility] OR;  Service: Vascular;  Laterality: Left;  Left basilic vein transposition with fistula.  . Tibia im nail insertion Left 09/09/2012    Procedure: INTRAMEDULLARY (IM) NAIL TIBIAL;  Surgeon: Johnny Bridge, MD;  Location: [place];  Service: Orthopedics;  Laterality: Left;  left tibial nail and open reduction internal fixation left fibula fracture  . Orif fibula fracture Left 09/09/2012    Procedure: OPEN REDUCTION INTERNAL FIXATION (ORIF) FIBULA FRACTURE;  Surgeon: Johnny Bridge, MD;  Location: [place];  Service: Orthopedics;  Laterality: Left;  . Colonoscopy N/A 10/28/2012    Procedure: COLONOSCOPY;  Surgeon: Jeryl Columbia, MD;  Location: [facility] ENDOSCOPY;  Service: Endoscopy;  Laterality: N/A;  . Esophagogastroduodenoscopy N/A 11/02/2012    Procedure: ESOPHAGOGASTRODUODENOSCOPY (EGD);  Surgeon: Cleotis Nipper, MD;  Location: [facility] ENDOSCOPY;  Service: Endoscopy;  Laterality: N/A;  . Colonoscopy N/A 11/02/2012    Procedure: COLONOSCOPY;  Surgeon: Cleotis Nipper, MD;  Location: [facility] ENDOSCOPY;  Service: Endoscopy;  Laterality: N/A;  . Colonoscopy N/A 11/03/2012    Procedure: COLONOSCOPY;  Surgeon: Cleotis Nipper, MD;  Location: [facility] ENDOSCOPY;  Service: Endoscopy;  Laterality: N/A;  . Givens capsule study N/A 11/04/2012    Procedure: GIVENS CAPSULE STUDY;  Surgeon: Cleotis Nipper, MD;  Location: [facility] ENDOSCOPY;  Service: Endoscopy;  Laterality: N/A;  . Enteroscopy N/A  11/08/2012    Procedure: ENTEROSCOPY;  Surgeon: Wonda Horner, MD;  Location: [facility] ENDOSCOPY;  Service: Endoscopy;  Laterality: N/A;  . Amputation Left 05/12/2013    Procedure: AMPUTATION RAY;  Surgeon: Newt Minion, MD;  Location: [place];  Service: Orthopedics;  Laterality: Left;  Left Foot 1st Ray Amputation  . Eye surgery Left     to remove scar tissue  . Amputation Left 06/09/2013    Procedure: AMPUTATION BELOW KNEE;  Surgeon: Newt Minion, MD;  Location: [place];  Service: Orthopedics;  Laterality: Left;  Left Below Knee Amputation and removal proximal screws IM tibial nail  . Hardware removal Left 06/09/2013    Procedure: HARDWARE REMOVAL;  Surgeon: Newt Minion, MD;  Location: [place];  Service: Orthopedics;  Laterality: Left;  Left Below Knee Amputation  and Removal proximal screws IM tibial nail  . Amputation Right 09/08/2013    Procedure: AMPUTATION BELOW KNEE;  Surgeon: Newt Minion, MD;  Location: [place];  Service: Orthopedics;  Laterality: Right;  Right Below Knee Amputation  . Amputation Right 10/11/2013    Procedure: AMPUTATION BELOW KNEE;  Surgeon: Newt Minion, MD;  Location: [place];  Service: Orthopedics;  Laterality: Right;  Right Below Knee Amputation Revision    reports that he has never smoked. He has never used smokeless tobacco. He reports that he does not drink alcohol or use illicit drugs. family history includes Coronary artery disease in his other; Diabetes in his other. No Known Allergies Current Outpatient Prescriptions on File Prior to Visit  Medication Sig Dispense Refill  . acetaminophen (TYLENOL) 500 MG tablet Take 500 mg by mouth every 6 (six) hours as needed for mild pain.    . Amino Acids-Protein Hydrolys (FEEDING SUPPLEMENT, PRO-STAT SUGAR FREE 64,) LIQD Place 30 mLs into feeding tube daily at 12 noon. 900 mL 0  . amLODipine (NORVASC) 10 MG tablet Take 10 mg by mouth daily.    . Ascorbic Acid (VITAMIN C PO) Take 1 tablet by mouth daily.    . bisacodyl (DULCOLAX) 5 MG EC tablet Take 1 tablet (5 mg total) by mouth 2 (two) times daily. 30 tablet 0  . brimonidine (ALPHAGAN) 0.2 % ophthalmic solution Place 1 drop into both eyes 2 (two) times daily.    . Brinzolamide-Brimonidine (SIMBRINZA) 1-0.2 % SUSP Place 1 drop into both eyes 2 (two) times daily.     . calcium acetate (PHOSLO) 667 MG capsule Take 2 capsules (1,334 mg total) by mouth 3 (three) times daily with meals. 180 capsule 0  . clopidogrel (PLAVIX) 75 MG tablet Take  75 mg by mouth daily.    . diphenoxylate-atropine (LOMOTIL) 2.5-0.025 MG per tablet Take 1 tablet by mouth 4 (four) times daily as needed for diarrhea or loose stools. 30 tablet 0  . FLUoxetine (PROZAC) 20 MG/5ML solution Place 5 mLs (20 mg total) into feeding tube daily. 120 mL 3  . gabapentin (NEURONTIN) 300 MG capsule Take 300 mg by mouth daily as needed (for mild to moderate pain).     . haloperidol (HALDOL) 5 MG tablet Take 5 mg by mouth at bedtime as needed for agitation.    . hydrALAZINE (APRESOLINE) 100 MG tablet Take 100 mg by mouth every 8 (eight) hours.    [name] HYDROcodone-acetaminophen (NORCO/VICODIN) 5-325 MG per tablet Take 1 tablet by mouth every 6 (six) hours as needed for moderate pain.    [name] labetalol (NORMODYNE) 300 MG tablet Take 1 tablet (300 mg total)  by mouth 3 (three) times daily. 90 tablet 6  . LORazepam (ATIVAN) 0.5 MG tablet Take 0.25 mg by mouth every 6 (six) hours as needed for anxiety.    . multivitamin (RENA-VIT) TABS tablet Take 1 tablet by mouth at bedtime. (Patient taking differently: Take 2 tablets by mouth at bedtime. ) 30 tablet 0  . neomycin-bacitracin-polymyxin (NEOSPORIN) OINT Apply 1 application topically 3 (three) times daily. 1 g 0  . Nutritional Supplements (FEEDING SUPPLEMENT, NEPRO CARB STEADY,) LIQD Place 1,000 mLs into feeding tube continuous. 30 Can 0  . saccharomyces boulardii (FLORASTOR) 250 MG capsule Take 250 mg by mouth 2 (two) times daily.    . sertraline (ZOLOFT) 100 MG tablet Take 100 mg by mouth daily as needed. For depression    . sevelamer carbonate (RENVELA) 800 MG tablet Take 1,600 mg by mouth 3 (three) times daily with meals.    . silver sulfADIAZINE (SILVADENE) 1 % cream Apply 1 application topically daily.    . Travoprost, BAK Free, (TRAVATAN) 0.004 % SOLN ophthalmic solution Place 1 drop into both eyes at bedtime. 1 Bottle 0  . traZODone (DESYREL) 50 MG tablet Take 50 mg by mouth at bedtime as needed for sleep.    . Water For Irrigation,  Sterile (FREE WATER) SOLN Place 175 mLs into feeding tube 4 (four) times daily. 1000 mL 0   No current facility-administered medications on file prior to visit.   Review of Systems  Constitutional: Negative for unusual diaphoresis or other sweats  HENT: Negative for ringing in ear Eyes: Negative for double vision or worsening visual disturbance.  Respiratory: Negative for choking and stridor.   Gastrointestinal: Negative for vomiting or other signifcant bowel change Genitourinary: Negative for hematuria or decreased urine volume.  Musculoskeletal: Negative for other MSK pain or swelling Skin: Negative for color change and worsening wound.  Neurological: Negative for tremors and numbness other than noted  Psychiatric/Behavioral: Negative for decreased concentration or agitation other than above       Objective:   Physical Exam BP 114/72 mmHg  Pulse 82  Temp(Src) 98.7 F (37.1 C) (Oral)  SpO2 98% VS noted,  Constitutional: Pt appears well-developed, well-nourished.  HENT: Head: NCAT.  Right Ear: External ear normal.  Left Ear: External ear normal.  Eyes: . Pupils are equal, round, and reactive to light. Conjunctivae and EOM are normal Neck: Normal range of motion. Neck supple.  Cardiovascular: Normal rate and regular rhythm.   Pulmonary/Chest: Effort normal and breath sounds without rales or wheezing.  Abd:  Soft, NT, ND, + BS Neurological: Pt is alert. Not confused , motor grossly intact Skin: Skin is warm. No rash Psychiatric: Pt behavior is normal. No agitation. , not depressed but some irritable S/p bilat BKA with prostheses, does not walk yet    Assessment & Plan:

## 2014-07-24 NOTE — Progress Notes (Signed)
Pre visit review using our clinic review tool, if applicable. No additional management support is needed unless otherwise documented below in the visit note. 

## 2014-07-24 NOTE — Assessment & Plan Note (Signed)
stable overall by history and exam, recent data reviewed with pt, and pt to continue medical treatment as before,  to f/u any worsening symptoms or concerns BP Readings from Last 3 Encounters:  07/24/14 114/72  07/21/14 125/78  12/14/13 97/73

## 2014-07-24 NOTE — Patient Instructions (Addendum)
Please continue all other medications as before, and refills have been done if requested.  Please have the pharmacy call with any other refills you may need.  Please continue your efforts at being more active, low cholesterol diet, and weight control.  Please keep your appointments with your specialists as you may have planned  Please return in 6 months, or sooner if needed 

## 2014-07-25 DIAGNOSIS — N2581 Secondary hyperparathyroidism of renal origin: Secondary | ICD-10-CM | POA: Diagnosis not present

## 2014-07-25 DIAGNOSIS — N186 End stage renal disease: Secondary | ICD-10-CM | POA: Diagnosis not present

## 2014-07-25 DIAGNOSIS — D509 Iron deficiency anemia, unspecified: Secondary | ICD-10-CM | POA: Diagnosis not present

## 2014-07-25 DIAGNOSIS — D631 Anemia in chronic kidney disease: Secondary | ICD-10-CM | POA: Diagnosis not present

## 2014-07-26 ENCOUNTER — Telehealth: Payer: Self-pay | Admitting: Internal Medicine

## 2014-07-26 DIAGNOSIS — Z931 Gastrostomy status: Secondary | ICD-10-CM

## 2014-07-26 NOTE — Telephone Encounter (Signed)
This has been done.

## 2014-07-26 NOTE — Telephone Encounter (Signed)
Wife is requesting a referral to [facility] Attn: Anderson Malta fax number [phone] to have feeding tube removed.

## 2014-07-27 DIAGNOSIS — I10 Essential (primary) hypertension: Secondary | ICD-10-CM | POA: Diagnosis not present

## 2014-07-27 DIAGNOSIS — D649 Anemia, unspecified: Secondary | ICD-10-CM | POA: Diagnosis not present

## 2014-07-27 DIAGNOSIS — N186 End stage renal disease: Secondary | ICD-10-CM | POA: Diagnosis not present

## 2014-07-28 DIAGNOSIS — N186 End stage renal disease: Secondary | ICD-10-CM | POA: Diagnosis not present

## 2014-07-28 DIAGNOSIS — N2581 Secondary hyperparathyroidism of renal origin: Secondary | ICD-10-CM | POA: Diagnosis not present

## 2014-07-28 DIAGNOSIS — D631 Anemia in chronic kidney disease: Secondary | ICD-10-CM | POA: Diagnosis not present

## 2014-07-28 DIAGNOSIS — D509 Iron deficiency anemia, unspecified: Secondary | ICD-10-CM | POA: Diagnosis not present

## 2014-07-30 ENCOUNTER — Telehealth: Payer: Self-pay | Admitting: *Deleted

## 2014-07-30 DIAGNOSIS — N186 End stage renal disease: Secondary | ICD-10-CM | POA: Diagnosis not present

## 2014-07-30 DIAGNOSIS — N2581 Secondary hyperparathyroidism of renal origin: Secondary | ICD-10-CM | POA: Diagnosis not present

## 2014-07-30 DIAGNOSIS — I10 Essential (primary) hypertension: Secondary | ICD-10-CM | POA: Diagnosis not present

## 2014-07-30 DIAGNOSIS — D649 Anemia, unspecified: Secondary | ICD-10-CM | POA: Diagnosis not present

## 2014-07-30 DIAGNOSIS — D631 Anemia in chronic kidney disease: Secondary | ICD-10-CM | POA: Diagnosis not present

## 2014-07-30 DIAGNOSIS — D509 Iron deficiency anemia, unspecified: Secondary | ICD-10-CM | POA: Diagnosis not present

## 2014-07-30 NOTE — Telephone Encounter (Signed)
Wife return call bck inform her that husband would need to have f/u appt if he is having ongoing diarrhea & to have C-diff test done. Wife states she has talk to the nurse at the nephrology office and they are going to order the c-diff she is own her way there to pick up specimen cup. Also inform pt wife the Rena-vite &Vitamin d they will need to get rx form the nephrology....Oscar Castillo

## 2014-07-30 NOTE — Telephone Encounter (Signed)
Nurse stated she received call from pt wife wanting to speak with md concerning diarrhea. ? C-diff wanting to be check.  Also wife had ?'s concerning his medications. Inform [facility] md is out of office, but looking at his chart he will need to have f/u appt. Tried calling wife [phone] no answer & couldn't leave msg due to vm being full. Called pt cell can't leave msg due to vm full...Johny Chess

## 2014-07-30 NOTE — Telephone Encounter (Signed)
Oscar Castillo - Client [facility] Patient Name: Oscar Castillo Gender: Male DOB: 1951-01-14 Age: 64 Y 11 M 54 D Return Phone Number: [phone] (Primary), [phone] (Secondary) Address: City/State/Zip: [place] Client [facility] [name] - Client Client Site [place] - Castillo Physician [name] Contact Type Call Call Type Triage / Clinical Caller Name Sherlynn Stalls Relationship To Patient Spouse Return Phone Number [phone] (Secondary) Chief Complaint Health information question (non symptomatic) Initial Comment Caller states her husband was seen last Tuesday, for Diarrhea. She is concerned about C-Diff. He no longer has diarrhea. Also needs some medication called in. Nurse Assessment Nurse: Verlin Fester RN, [facility] Date/Time Eilene Ghazi Time): 07/30/2014 10:43:16 AM Confirm and document reason for call. If symptomatic, describe symptoms. ---Caller states her husband is having episodes of diarrhea. States he is not having it now but he keeps having episodes off and on and he has a history of C-diff and wife is concerned that he might be having this again. They also left his medication list with the office at his last visit and they did not get refills on his Vitamin D, Losartin and Renovite. Asking if these can be sent in for him. He is a double amputee and dialysis patient and he was recently in rehab where he had C-diff. Has the patient traveled out of the country within the last 30 days? ---Not Applicable Does the patient require triage? ---No Please document clinical information provided and list any resource used. ---Patient is not having any symptoms now. Caller instructed that I will call the office about this Guidelines Guideline Title Affirmed Question Affirmed Notes Nurse Date/Time (Eastern Time) Disp. Time Eilene Ghazi Time) Disposition Final User 07/30/2014 11:09:13 AM Called On-Call Provider Verlin Fester, RN,  [facility] Reason: Office back line 07/30/2014 11:09:41 AM Clinical Call Yes Verlin Fester, RN, [facility] After Care Instructions Given Call Event Type User Date / Time Description

## 2014-07-31 DIAGNOSIS — E1040 Type 1 diabetes mellitus with diabetic neuropathy, unspecified: Secondary | ICD-10-CM | POA: Diagnosis not present

## 2014-07-31 DIAGNOSIS — I6931 Cognitive deficits following cerebral infarction: Secondary | ICD-10-CM | POA: Diagnosis not present

## 2014-07-31 DIAGNOSIS — Z4781 Encounter for orthopedic aftercare following surgical amputation: Secondary | ICD-10-CM | POA: Diagnosis not present

## 2014-07-31 DIAGNOSIS — I5042 Chronic combined systolic (congestive) and diastolic (congestive) heart failure: Secondary | ICD-10-CM | POA: Diagnosis not present

## 2014-07-31 DIAGNOSIS — N186 End stage renal disease: Secondary | ICD-10-CM | POA: Diagnosis not present

## 2014-07-31 DIAGNOSIS — I120 Hypertensive chronic kidney disease with stage 5 chronic kidney disease or end stage renal disease: Secondary | ICD-10-CM | POA: Diagnosis not present

## 2014-08-01 ENCOUNTER — Telehealth: Payer: Self-pay | Admitting: Internal Medicine

## 2014-08-01 DIAGNOSIS — K769 Liver disease, unspecified: Secondary | ICD-10-CM | POA: Diagnosis not present

## 2014-08-01 DIAGNOSIS — E119 Type 2 diabetes mellitus without complications: Secondary | ICD-10-CM | POA: Diagnosis not present

## 2014-08-01 DIAGNOSIS — A047 Enterocolitis due to Clostridium difficile: Secondary | ICD-10-CM | POA: Diagnosis not present

## 2014-08-01 DIAGNOSIS — N186 End stage renal disease: Secondary | ICD-10-CM | POA: Diagnosis not present

## 2014-08-01 DIAGNOSIS — D631 Anemia in chronic kidney disease: Secondary | ICD-10-CM | POA: Diagnosis not present

## 2014-08-01 DIAGNOSIS — I639 Cerebral infarction, unspecified: Secondary | ICD-10-CM

## 2014-08-01 DIAGNOSIS — N2581 Secondary hyperparathyroidism of renal origin: Secondary | ICD-10-CM | POA: Diagnosis not present

## 2014-08-01 DIAGNOSIS — D509 Iron deficiency anemia, unspecified: Secondary | ICD-10-CM | POA: Diagnosis not present

## 2014-08-01 DIAGNOSIS — I5040 Unspecified combined systolic (congestive) and diastolic (congestive) heart failure: Secondary | ICD-10-CM

## 2014-08-01 NOTE — Telephone Encounter (Signed)
Sharyn Lull called from [facility] to let MD know, OT and PT cannot provide care, pt is in such a small space in his home, recommends going to OP/PT  [phone]

## 2014-08-01 NOTE — Telephone Encounter (Signed)
Referrals done.

## 2014-08-03 DIAGNOSIS — N2581 Secondary hyperparathyroidism of renal origin: Secondary | ICD-10-CM | POA: Diagnosis not present

## 2014-08-03 DIAGNOSIS — D509 Iron deficiency anemia, unspecified: Secondary | ICD-10-CM | POA: Diagnosis not present

## 2014-08-03 DIAGNOSIS — D631 Anemia in chronic kidney disease: Secondary | ICD-10-CM | POA: Diagnosis not present

## 2014-08-03 DIAGNOSIS — N186 End stage renal disease: Secondary | ICD-10-CM | POA: Diagnosis not present

## 2014-08-07 DIAGNOSIS — N2581 Secondary hyperparathyroidism of renal origin: Secondary | ICD-10-CM | POA: Diagnosis not present

## 2014-08-07 DIAGNOSIS — N186 End stage renal disease: Secondary | ICD-10-CM | POA: Diagnosis not present

## 2014-08-07 DIAGNOSIS — Z992 Dependence on renal dialysis: Secondary | ICD-10-CM | POA: Diagnosis not present

## 2014-08-07 DIAGNOSIS — D631 Anemia in chronic kidney disease: Secondary | ICD-10-CM | POA: Diagnosis not present

## 2014-08-09 ENCOUNTER — Encounter (HOSPITAL_COMMUNITY): Payer: Self-pay | Admitting: Orthopedic Surgery

## 2014-08-09 DIAGNOSIS — D631 Anemia in chronic kidney disease: Secondary | ICD-10-CM | POA: Diagnosis not present

## 2014-08-09 DIAGNOSIS — N2581 Secondary hyperparathyroidism of renal origin: Secondary | ICD-10-CM | POA: Diagnosis not present

## 2014-08-09 DIAGNOSIS — N186 End stage renal disease: Secondary | ICD-10-CM | POA: Diagnosis not present

## 2014-08-09 DIAGNOSIS — E1122 Type 2 diabetes mellitus with diabetic chronic kidney disease: Secondary | ICD-10-CM | POA: Diagnosis not present

## 2014-08-10 ENCOUNTER — Ambulatory Visit (HOSPITAL_COMMUNITY)
Admission: RE | Admit: 2014-08-10 | Discharge: 2014-08-10 | Disposition: A | Discharge status: Home / Self Care | Payer: Medicare Other | Source: Ambulatory Visit | Attending: Emergency Medicine | Admitting: Emergency Medicine

## 2014-08-10 ENCOUNTER — Telehealth: Payer: Self-pay | Admitting: *Deleted

## 2014-08-10 DIAGNOSIS — Z431 Encounter for attention to gastrostomy: Secondary | ICD-10-CM | POA: Insufficient documentation

## 2014-08-10 DIAGNOSIS — Z4682 Encounter for fitting and adjustment of non-vascular catheter: Secondary | ICD-10-CM | POA: Diagnosis not present

## 2014-08-10 MED ORDER — LIDOCAINE VISCOUS 2 % MT SOLN
OROMUCOSAL | Status: AC
  Filled 2014-08-10: qty 15

## 2014-08-10 NOTE — Telephone Encounter (Signed)
Called pt/wife no answer LMOM RTC.../lmb 

## 2014-08-10 NOTE — Telephone Encounter (Signed)
Wife called and stated husband is a diabetic, but he is not taking any diabetes medications. They are concern because husband ended up in [facility] rehab [place], and was told that all his problems " Kidney failure, heart issues ect" because he was a diabetic. Before he was taking insulin, but since rehab he hasn't had any diabetic medication. They are wanting to know does he need to be taking diabetes meds...Oscar Castillo

## 2014-08-10 NOTE — Telephone Encounter (Signed)
OK to make OV as he has not had Hgba1c done since early 2014 (and medicare will not pay for blood work without OV)  He may need to be on further DM medication (I suspect he does)

## 2014-08-10 NOTE — Procedures (Signed)
Successful bedside removal of gastrostomy tube.  No immediate post procedural complications.

## 2014-08-11 DIAGNOSIS — D631 Anemia in chronic kidney disease: Secondary | ICD-10-CM | POA: Diagnosis not present

## 2014-08-11 DIAGNOSIS — N2581 Secondary hyperparathyroidism of renal origin: Secondary | ICD-10-CM | POA: Diagnosis not present

## 2014-08-11 DIAGNOSIS — N186 End stage renal disease: Secondary | ICD-10-CM | POA: Diagnosis not present

## 2014-08-14 ENCOUNTER — Other Ambulatory Visit (INDEPENDENT_AMBULATORY_CARE_PROVIDER_SITE_OTHER): Payer: Medicare Other

## 2014-08-14 ENCOUNTER — Ambulatory Visit (INDEPENDENT_AMBULATORY_CARE_PROVIDER_SITE_OTHER): Payer: Medicare Other | Admitting: Internal Medicine

## 2014-08-14 ENCOUNTER — Encounter: Payer: Self-pay | Admitting: Internal Medicine

## 2014-08-14 VITALS — BP 132/80 | HR 88 | Temp 98.4°F | Ht <= 58 in

## 2014-08-14 DIAGNOSIS — R197 Diarrhea, unspecified: Secondary | ICD-10-CM

## 2014-08-14 DIAGNOSIS — I10 Essential (primary) hypertension: Secondary | ICD-10-CM | POA: Diagnosis not present

## 2014-08-14 DIAGNOSIS — E1029 Type 1 diabetes mellitus with other diabetic kidney complication: Secondary | ICD-10-CM | POA: Diagnosis not present

## 2014-08-14 DIAGNOSIS — D631 Anemia in chronic kidney disease: Secondary | ICD-10-CM | POA: Diagnosis not present

## 2014-08-14 DIAGNOSIS — N2581 Secondary hyperparathyroidism of renal origin: Secondary | ICD-10-CM | POA: Diagnosis not present

## 2014-08-14 DIAGNOSIS — I639 Cerebral infarction, unspecified: Secondary | ICD-10-CM | POA: Diagnosis not present

## 2014-08-14 DIAGNOSIS — N186 End stage renal disease: Secondary | ICD-10-CM | POA: Diagnosis not present

## 2014-08-14 LAB — BASIC METABOLIC PANEL
BUN: 27 mg/dL — AB (ref 6–23)
CHLORIDE: 96 meq/L (ref 96–112)
CO2: 32 mEq/L (ref 19–32)
Calcium: 8.8 mg/dL (ref 8.4–10.5)
Creatinine, Ser: 5.25 mg/dL (ref 0.40–1.50)
GFR: 14.32 mL/min — CL (ref >60.00)
Glucose, Bld: 151 mg/dL — ABNORMAL HIGH (ref 70–99)
Potassium: 4.1 mEq/L (ref 3.5–5.1)
SODIUM: 135 meq/L (ref 135–145)

## 2014-08-14 LAB — LIPID PANEL
CHOL/HDL RATIO: 3
Cholesterol: 123 mg/dL (ref 0–200)
HDL: 39.7 mg/dL (ref >39.00)
LDL Cholesterol: 65 mg/dL (ref 0–99)
NONHDL: 83.3
Triglycerides: 94 mg/dL (ref 0.0–149.0)
VLDL: 18.8 mg/dL (ref 0.0–40.0)

## 2014-08-14 LAB — HEMOGLOBIN A1C: Hgb A1c MFr Bld: 5.8 % (ref 4.6–6.5)

## 2014-08-14 MED ORDER — ONETOUCH ULTRA 2 W/DEVICE KIT: PACK | Status: DC

## 2014-08-14 MED ORDER — GLUCOSE BLOOD VI STRP: ORAL_STRIP | Status: DC

## 2014-08-14 MED ORDER — LANCETS MISC: Status: DC

## 2014-08-14 NOTE — Progress Notes (Signed)
Pre visit review using our clinic review tool, if applicable. No additional management support is needed unless otherwise documented below in the visit note. 

## 2014-08-14 NOTE — Patient Instructions (Addendum)
Please continue all other medications as before, including finishing the last of your vancomycin  We may need a referral to Gastroenterology if the diarrhea persists  Please have the pharmacy call with any other refills you may need.  Please continue your efforts at being more active, low cholesterol diabetic diet, and weight control.  Please keep your appointments with your specialists as you may have planned  Please go to the LAB in the Basement (turn left off the elevator) for the tests to be done today - blood and stool testing  You will be contacted by phone if any changes need to be made immediately.  Otherwise, you will receive a letter about your results with an explanation, but please check with MyChart first.  Please remember to sign up for MyChart if you have not done so, as this will be important to you in the future with finding out test results, communicating by private email, and scheduling acute appointments online when needed.  Please return in 6 months, or sooner if needed

## 2014-08-14 NOTE — Assessment & Plan Note (Signed)
stable overall by history and exam, recent data reviewed with pt, and pt to continue medical treatment as before,  to f/u any worsening symptoms or concerns BP Readings from Last 3 Encounters:  08/14/14 132/80  07/24/14 114/72  07/21/14 125/78

## 2014-08-14 NOTE — Assessment & Plan Note (Signed)
Etiology unclear, for repeat c-diff, stool studies, consider rifamaxin 550 tid x 14 day course

## 2014-08-14 NOTE — Assessment & Plan Note (Signed)
stable overall by history and exam, recent data reviewed with pt, and pt to continue medical treatment as before,  to f/u any worsening symptoms or concerns Lab Results  Component Value Date   HGBA1C 5.8 08/14/2014

## 2014-08-14 NOTE — Progress Notes (Signed)
Subjective:    Patient ID: Oscar Castillo, male    DOB: [birth year], 64 y.o.   MRN: [id number]  HPI  Here to f/u; overall doing ok,  Pt denies chest pain, increased sob or doe, wheezing, orthopnea, PND, increased LE swelling, palpitations, dizziness or syncope.  Pt denies polydipsia, polyuria, or low sugar symptoms such as weakness or confusion improved with po intake.  Pt denies new neurological symptoms such as new headache, or facial or extremity weakness or numbness.   Pt states overall good compliance with meds, has been trying to follow lower cholesterol, diabetic diet, with wt overall stable Denies worsening reflux, abd pain, dysphagia, n/v, bowel change or blood, except for ongoing diarrheal illness now for over 1 mo, recent c diff neg per wife, but pt on vanc po I suspect empiric tx, per Dialysis MD pt 's wife states is no longer at that dialysis site. Wt Readings from Last 3 Encounters:  07/21/14 171 lb (77.565 kg)  10/30/13 178 lb 9.2 oz (81 kg)  09/29/13 166 lb (75.297 kg)  Not checking sugars.   Past Medical History  Diagnosis Date  . ESRD on hemodialysis 05/05/2007    ESRD due to DM/HTN. Started dialysis in November 2013.  HD TTS at [facility] on [place].  [name] BACK PAIN, LUMBAR, CHRONIC 08/06/2009  . BENIGN PROSTATIC HYPERTROPHY 08/01/2010  . CEREBROVASCULAR ACCIDENT, HX OF 08/06/2009  . CHOLELITHIASIS 08/01/2010  . CONGESTIVE HEART FAILURE 03/18/2009  . DEPRESSION 03/18/2009  . DIABETES MELLITUS, TYPE II 03/25/2007  . ERECTILE DYSFUNCTION 03/25/2007  . GERD 03/25/2007  . HEPATITIS C, HX OF 03/25/2007  . HYPERTENSION 03/25/2007  . Morbid obesity 03/25/2007  . NEPHROLITHIASIS, HX OF 03/18/2009  . Complication of anesthesia     wife states pt had trouble waking up with his last surgery in Nov., 2014   Past Surgical History  Procedure Laterality Date  . Nephrectomy      partial RR  . Av fistula placement  06/14/2012    Procedure: ARTERIOVENOUS (AV) FISTULA CREATION;  Surgeon:  Angelia Mould, MD;  Location: [facility];  Service: Vascular;  Laterality: Left;  Left basilic vein transposition with fistula.  . Tibia im nail insertion Left 09/09/2012    Procedure: INTRAMEDULLARY (IM) NAIL TIBIAL;  Surgeon: Johnny Bridge, MD;  Location: [place];  Service: Orthopedics;  Laterality: Left;  left tibial nail and open reduction internal fixation left fibula fracture  . Orif fibula fracture Left 09/09/2012    Procedure: OPEN REDUCTION INTERNAL FIXATION (ORIF) FIBULA FRACTURE;  Surgeon: Johnny Bridge, MD;  Location: [place];  Service: Orthopedics;  Laterality: Left;  . Colonoscopy N/A 10/28/2012    Procedure: COLONOSCOPY;  Surgeon: Jeryl Columbia, MD;  Location: [facility] ENDOSCOPY;  Service: Endoscopy;  Laterality: N/A;  . Esophagogastroduodenoscopy N/A 11/02/2012    Procedure: ESOPHAGOGASTRODUODENOSCOPY (EGD);  Surgeon: Cleotis Nipper, MD;  Location: [facility] ENDOSCOPY;  Service: Endoscopy;  Laterality: N/A;  . Colonoscopy N/A 11/02/2012    Procedure: COLONOSCOPY;  Surgeon: Cleotis Nipper, MD;  Location: [facility] ENDOSCOPY;  Service: Endoscopy;  Laterality: N/A;  . Colonoscopy N/A 11/03/2012    Procedure: COLONOSCOPY;  Surgeon: Cleotis Nipper, MD;  Location: [facility] ENDOSCOPY;  Service: Endoscopy;  Laterality: N/A;  . Givens capsule study N/A 11/04/2012    Procedure: GIVENS CAPSULE STUDY;  Surgeon: Cleotis Nipper, MD;  Location: [facility] ENDOSCOPY;  Service: Endoscopy;  Laterality: N/A;  . Enteroscopy N/A 11/08/2012    Procedure: ENTEROSCOPY;  Surgeon: Wonda Horner, MD;  Location: [facility] ENDOSCOPY;  Service: Endoscopy;  Laterality: N/A;  . Amputation Left 05/12/2013    Procedure: AMPUTATION RAY;  Surgeon: Newt Minion, MD;  Location: [place];  Service: Orthopedics;  Laterality: Left;  Left Foot 1st Ray Amputation  . Eye surgery Left     to remove scar tissue  . Amputation Left 06/09/2013    Procedure: AMPUTATION BELOW KNEE;  Surgeon: Newt Minion, MD;  Location: [place];  Service: Orthopedics;  Laterality: Left;   Left Below Knee Amputation and removal proximal screws IM tibial nail  . Hardware removal Left 06/09/2013    Procedure: HARDWARE REMOVAL;  Surgeon: Newt Minion, MD;  Location: [place];  Service: Orthopedics;  Laterality: Left;  Left Below Knee Amputation  and Removal proximal screws IM tibial nail  . Amputation Right 09/08/2013    Procedure: AMPUTATION BELOW KNEE;  Surgeon: Newt Minion, MD;  Location: [place];  Service: Orthopedics;  Laterality: Right;  Right Below Knee Amputation  . Amputation Right 10/11/2013    Procedure: AMPUTATION BELOW KNEE;  Surgeon: Newt Minion, MD;  Location: [place];  Service: Orthopedics;  Laterality: Right;  Right Below Knee Amputation Revision    reports that he has never smoked. He has never used smokeless tobacco. He reports that he does not drink alcohol or use illicit drugs. family history includes Coronary artery disease in his other; Diabetes in his other. No Known Allergies Current Outpatient Prescriptions on File Prior to Visit  Medication Sig Dispense Refill  . acetaminophen (TYLENOL) 500 MG tablet Take 500 mg by mouth every 6 (six) hours as needed for mild pain.    . Amino Acids-Protein Hydrolys (FEEDING SUPPLEMENT, PRO-STAT SUGAR FREE 64,) LIQD Place 30 mLs into feeding tube daily at 12 noon. 900 mL 0  . amLODipine (NORVASC) 10 MG tablet Take 1 tablet (10 mg total) by mouth daily. 90 tablet 3  . Ascorbic Acid (VITAMIN C PO) Take 1 tablet by mouth daily.    . bisacodyl (DULCOLAX) 5 MG EC tablet Take 1 tablet (5 mg total) by mouth 2 (two) times daily. 30 tablet 0  . brimonidine (ALPHAGAN) 0.2 % ophthalmic solution Place 1 drop into both eyes 2 (two) times daily.    . Brinzolamide-Brimonidine (SIMBRINZA) 1-0.2 % SUSP Place 1 drop into both eyes 2 (two) times daily.     . calcium acetate (PHOSLO) 667 MG capsule Take 2 capsules (1,334 mg total) by mouth 3 (three) times daily with meals. 180 capsule 0  . clopidogrel (PLAVIX) 75 MG tablet Take 1 tablet (75 mg  total) by mouth daily. 90 tablet 3  . diphenoxylate-atropine (LOMOTIL) 2.5-0.025 MG per tablet Take 1 tablet by mouth 4 (four) times daily as needed for diarrhea or loose stools. 30 tablet 0  . FLUoxetine (PROZAC) 20 MG/5ML solution Place 5 mLs (20 mg total) into feeding tube daily. 120 mL 3  . gabapentin (NEURONTIN) 300 MG capsule Take 300 mg by mouth daily as needed (for mild to moderate pain).     . haloperidol (HALDOL) 5 MG tablet Take 5 mg by mouth at bedtime as needed for agitation.    . hydrALAZINE (APRESOLINE) 100 MG tablet Take 100 mg by mouth every 8 (eight) hours.    [name] HYDROcodone-acetaminophen (NORCO/VICODIN) 5-325 MG per tablet Take 1 tablet by mouth every 6 (six) hours as needed for moderate pain.    [name] labetalol (NORMODYNE) 300 MG tablet Take 1 tablet (300 mg  total) by mouth 3 (three) times daily. 90 tablet 11  . LORazepam (ATIVAN) 0.5 MG tablet Take 0.25 mg by mouth every 6 (six) hours as needed for anxiety.    . multivitamin (RENA-VIT) TABS tablet Take 1 tablet by mouth at bedtime. (Patient taking differently: Take 2 tablets by mouth at bedtime. ) 30 tablet 0  . neomycin-bacitracin-polymyxin (NEOSPORIN) OINT Apply 1 application topically 3 (three) times daily. 1 g 0  . Nutritional Supplements (FEEDING SUPPLEMENT, NEPRO CARB STEADY,) LIQD Place 1,000 mLs into feeding tube continuous. 30 Can 0  . saccharomyces boulardii (FLORASTOR) 250 MG capsule Take 250 mg by mouth 2 (two) times daily.    . sertraline (ZOLOFT) 100 MG tablet Take 1 tablet (100 mg total) by mouth daily as needed. For depression 90 tablet 3  . sevelamer carbonate (RENVELA) 800 MG tablet Take 1,600 mg by mouth 3 (three) times daily with meals.    . silver sulfADIAZINE (SILVADENE) 1 % cream Apply 1 application topically daily.    . Travoprost, BAK Free, (TRAVATAN) 0.004 % SOLN ophthalmic solution Place 1 drop into both eyes at bedtime. 1 Bottle 0  . traZODone (DESYREL) 50 MG tablet Take 50 mg by mouth at bedtime as needed  for sleep.    . Water For Irrigation, Sterile (FREE WATER) SOLN Place 175 mLs into feeding tube 4 (four) times daily. 1000 mL 0   No current facility-administered medications on file prior to visit.   Review of Systems  Constitutional: Negative for unusual diaphoresis or other sweats  HENT: Negative for ringing in ear Eyes: Negative for double vision or worsening visual disturbance.  Respiratory: Negative for choking and stridor.   Gastrointestinal: Negative for vomiting or other signifcant bowel change Genitourinary: Negative for hematuria or decreased urine volume.  Musculoskeletal: Negative for other MSK pain or swelling Skin: Negative for color change and worsening wound.  Neurological: Negative for tremors and numbness other than noted  Psychiatric/Behavioral: Negative for decreased concentration or agitation other than above       Objective:   Physical Exam BP 132/80 mmHg  Pulse 88  Temp(Src) 98.4 F (36.9 C) (Oral)  Ht 4\' 8"  (1.422 m)  SpO2 99% VS noted,  Constitutional: Pt appears well-developed, well-nourished.  HENT: Head: NCAT.  Right Ear: External ear normal.  Left Ear: External ear normal.  Eyes: . Pupils are equal, round, and reactive to light. Conjunctivae and EOM are normal Neck: Normal range of motion. Neck supple.  Cardiovascular: Normal rate and regular rhythm.   Pulmonary/Chest: Effort normal and breath sounds without rales or wheezing.  Abd:  Soft, NT, ND, + BS Neurological: Pt is alert. Not confused , motor grossly intact Skin: Skin is warm. No rash Psychiatric: Pt behavior is normal. No agitation.      Assessment & Plan:

## 2014-08-16 ENCOUNTER — Ambulatory Visit: Payer: Medicare Other

## 2014-08-16 ENCOUNTER — Other Ambulatory Visit: Payer: Medicare Other

## 2014-08-16 ENCOUNTER — Ambulatory Visit: Payer: Medicare Other | Admitting: Occupational Therapy

## 2014-08-16 DIAGNOSIS — R197 Diarrhea, unspecified: Secondary | ICD-10-CM | POA: Diagnosis not present

## 2014-08-16 DIAGNOSIS — D631 Anemia in chronic kidney disease: Secondary | ICD-10-CM | POA: Diagnosis not present

## 2014-08-16 DIAGNOSIS — N186 End stage renal disease: Secondary | ICD-10-CM | POA: Diagnosis not present

## 2014-08-16 DIAGNOSIS — N2581 Secondary hyperparathyroidism of renal origin: Secondary | ICD-10-CM | POA: Diagnosis not present

## 2014-08-17 LAB — OVA AND PARASITE EXAMINATION: OP: NONE SEEN

## 2014-08-18 DIAGNOSIS — D631 Anemia in chronic kidney disease: Secondary | ICD-10-CM | POA: Diagnosis not present

## 2014-08-18 DIAGNOSIS — N2581 Secondary hyperparathyroidism of renal origin: Secondary | ICD-10-CM | POA: Diagnosis not present

## 2014-08-18 DIAGNOSIS — N186 End stage renal disease: Secondary | ICD-10-CM | POA: Diagnosis not present

## 2014-08-21 ENCOUNTER — Telehealth: Payer: Self-pay

## 2014-08-21 DIAGNOSIS — N2581 Secondary hyperparathyroidism of renal origin: Secondary | ICD-10-CM | POA: Diagnosis not present

## 2014-08-21 DIAGNOSIS — D631 Anemia in chronic kidney disease: Secondary | ICD-10-CM | POA: Diagnosis not present

## 2014-08-21 DIAGNOSIS — N186 End stage renal disease: Secondary | ICD-10-CM | POA: Diagnosis not present

## 2014-08-21 NOTE — Telephone Encounter (Signed)
Patient called requesting stool results.   Call back number is [phone] when results are done

## 2014-08-21 NOTE — Telephone Encounter (Signed)
Patient informed. 

## 2014-08-21 NOTE — Telephone Encounter (Signed)
All neg so far, though the c diff and stool cx final seems to be still pending if specimens were given for this

## 2014-08-23 DIAGNOSIS — N2581 Secondary hyperparathyroidism of renal origin: Secondary | ICD-10-CM | POA: Diagnosis not present

## 2014-08-23 DIAGNOSIS — D631 Anemia in chronic kidney disease: Secondary | ICD-10-CM | POA: Diagnosis not present

## 2014-08-23 DIAGNOSIS — N186 End stage renal disease: Secondary | ICD-10-CM | POA: Diagnosis not present

## 2014-08-25 DIAGNOSIS — N186 End stage renal disease: Secondary | ICD-10-CM | POA: Diagnosis not present

## 2014-08-25 DIAGNOSIS — D631 Anemia in chronic kidney disease: Secondary | ICD-10-CM | POA: Diagnosis not present

## 2014-08-25 DIAGNOSIS — N2581 Secondary hyperparathyroidism of renal origin: Secondary | ICD-10-CM | POA: Diagnosis not present

## 2014-08-28 DIAGNOSIS — D631 Anemia in chronic kidney disease: Secondary | ICD-10-CM | POA: Diagnosis not present

## 2014-08-28 DIAGNOSIS — N2581 Secondary hyperparathyroidism of renal origin: Secondary | ICD-10-CM | POA: Diagnosis not present

## 2014-08-28 DIAGNOSIS — E1122 Type 2 diabetes mellitus with diabetic chronic kidney disease: Secondary | ICD-10-CM | POA: Diagnosis not present

## 2014-08-28 DIAGNOSIS — N186 End stage renal disease: Secondary | ICD-10-CM | POA: Diagnosis not present

## 2014-08-30 DIAGNOSIS — N186 End stage renal disease: Secondary | ICD-10-CM | POA: Diagnosis not present

## 2014-08-30 DIAGNOSIS — E1122 Type 2 diabetes mellitus with diabetic chronic kidney disease: Secondary | ICD-10-CM | POA: Diagnosis not present

## 2014-08-30 DIAGNOSIS — N2581 Secondary hyperparathyroidism of renal origin: Secondary | ICD-10-CM | POA: Diagnosis not present

## 2014-08-30 DIAGNOSIS — D631 Anemia in chronic kidney disease: Secondary | ICD-10-CM | POA: Diagnosis not present

## 2014-09-03 ENCOUNTER — Encounter: Payer: Self-pay | Admitting: Occupational Therapy

## 2014-09-03 ENCOUNTER — Ambulatory Visit: Payer: Medicare Other | Attending: Internal Medicine | Admitting: Occupational Therapy

## 2014-09-03 ENCOUNTER — Ambulatory Visit: Payer: Medicare Other | Admitting: Physical Therapy

## 2014-09-03 ENCOUNTER — Encounter: Payer: Self-pay | Admitting: Physical Therapy

## 2014-09-03 DIAGNOSIS — Z7409 Other reduced mobility: Secondary | ICD-10-CM | POA: Diagnosis not present

## 2014-09-03 DIAGNOSIS — Z89512 Acquired absence of left leg below knee: Secondary | ICD-10-CM | POA: Insufficient documentation

## 2014-09-03 DIAGNOSIS — R5381 Other malaise: Secondary | ICD-10-CM

## 2014-09-03 DIAGNOSIS — Z89511 Acquired absence of right leg below knee: Secondary | ICD-10-CM | POA: Insufficient documentation

## 2014-09-03 DIAGNOSIS — R4189 Other symptoms and signs involving cognitive functions and awareness: Secondary | ICD-10-CM | POA: Diagnosis not present

## 2014-09-03 DIAGNOSIS — R531 Weakness: Secondary | ICD-10-CM | POA: Diagnosis not present

## 2014-09-03 DIAGNOSIS — R2689 Other abnormalities of gait and mobility: Secondary | ICD-10-CM

## 2014-09-03 DIAGNOSIS — R269 Unspecified abnormalities of gait and mobility: Secondary | ICD-10-CM

## 2014-09-03 NOTE — Therapy (Signed)
[facility] [address] [place], [place], [address] Phone: [phone]   Fax:  [phone]  Occupational Therapy Evaluation  Patient Details  Name: ROCHESTER SERPE MRN: [id number] Date of Birth: [birth year] Referring Provider:  [name], MD  Encounter Date: 09/03/2014      OT End of Session - 09/03/14 1026    Visit Number 1   Number of Visits 16   Date for OT Re-Evaluation 10/29/14   Authorization Type medicare needs G code   OT Start Time 0932   OT Stop Time 1018   OT Time Calculation (min) 46 min   Activity Tolerance Patient tolerated treatment well      Past Medical History  Diagnosis Date  . ESRD on hemodialysis 05/05/2007    ESRD due to DM/HTN. Started dialysis in November 2013.  HD TTS at [facility] on [place].  [name] BACK PAIN, LUMBAR, CHRONIC 08/06/2009  . BENIGN PROSTATIC HYPERTROPHY 08/01/2010  . CEREBROVASCULAR ACCIDENT, HX OF 08/06/2009  . CHOLELITHIASIS 08/01/2010  . CONGESTIVE HEART FAILURE 03/18/2009  . DEPRESSION 03/18/2009  . DIABETES MELLITUS, TYPE II 03/25/2007  . ERECTILE DYSFUNCTION 03/25/2007  . GERD 03/25/2007  . HEPATITIS C, HX OF 03/25/2007  . HYPERTENSION 03/25/2007  . Morbid obesity 03/25/2007  . NEPHROLITHIASIS, HX OF 03/18/2009  . Complication of anesthesia     wife states pt had trouble waking up with his last surgery in Nov., 2014    Past Surgical History  Procedure Laterality Date  . Nephrectomy      partial RR  . Av fistula placement  06/14/2012    Procedure: ARTERIOVENOUS (AV) FISTULA CREATION;  Surgeon: Angelia Mould, MD;  Location: [facility] OR;  Service: Vascular;  Laterality: Left;  Left basilic vein transposition with fistula.  . Tibia im nail insertion Left 09/09/2012    Procedure: INTRAMEDULLARY (IM) NAIL TIBIAL;  Surgeon: Johnny Bridge, MD;  Location: [place];  Service: Orthopedics;  Laterality: Left;  left tibial nail and open reduction internal fixation left fibula  fracture  . Orif fibula fracture Left 09/09/2012    Procedure: OPEN REDUCTION INTERNAL FIXATION (ORIF) FIBULA FRACTURE;  Surgeon: Johnny Bridge, MD;  Location: [place];  Service: Orthopedics;  Laterality: Left;  . Colonoscopy N/A 10/28/2012    Procedure: COLONOSCOPY;  Surgeon: Jeryl Columbia, MD;  Location: [facility] ENDOSCOPY;  Service: Endoscopy;  Laterality: N/A;  . Esophagogastroduodenoscopy N/A 11/02/2012    Procedure: ESOPHAGOGASTRODUODENOSCOPY (EGD);  Surgeon: Cleotis Nipper, MD;  Location: [facility] ENDOSCOPY;  Service: Endoscopy;  Laterality: N/A;  . Colonoscopy N/A 11/02/2012    Procedure: COLONOSCOPY;  Surgeon: Cleotis Nipper, MD;  Location: [facility] ENDOSCOPY;  Service: Endoscopy;  Laterality: N/A;  . Colonoscopy N/A 11/03/2012    Procedure: COLONOSCOPY;  Surgeon: Cleotis Nipper, MD;  Location: [facility] ENDOSCOPY;  Service: Endoscopy;  Laterality: N/A;  . Givens capsule study N/A 11/04/2012    Procedure: GIVENS CAPSULE STUDY;  Surgeon: Cleotis Nipper, MD;  Location: [facility] ENDOSCOPY;  Service: Endoscopy;  Laterality: N/A;  . Enteroscopy N/A 11/08/2012    Procedure: ENTEROSCOPY;  Surgeon: Wonda Horner, MD;  Location: [facility] ENDOSCOPY;  Service: Endoscopy;  Laterality: N/A;  . Amputation Left 05/12/2013    Procedure: AMPUTATION RAY;  Surgeon: Newt Minion, MD;  Location: [place];  Service: Orthopedics;  Laterality: Left;  Left Foot 1st Ray Amputation  . Eye surgery Left     to remove scar tissue  . Amputation Left 06/09/2013  Procedure: AMPUTATION BELOW KNEE;  Surgeon: Newt Minion, MD;  Location: [place];  Service: Orthopedics;  Laterality: Left;  Left Below Knee Amputation and removal proximal screws IM tibial nail  . Hardware removal Left 06/09/2013    Procedure: HARDWARE REMOVAL;  Surgeon: Newt Minion, MD;  Location: [place];  Service: Orthopedics;  Laterality: Left;  Left Below Knee Amputation  and Removal proximal screws IM tibial nail  . Amputation Right 09/08/2013    Procedure: AMPUTATION BELOW KNEE;  Surgeon:  Newt Minion, MD;  Location: [place];  Service: Orthopedics;  Laterality: Right;  Right Below Knee Amputation  . Amputation Right 10/11/2013    Procedure: AMPUTATION BELOW KNEE;  Surgeon: Newt Minion, MD;  Location: [place];  Service: Orthopedics;  Laterality: Right;  Right Below Knee Amputation Revision    There were no vitals taken for this visit.  Visit Diagnosis:  Generalized weakness - Plan: Ot plan of care cert/re-cert  Debility - Plan: Ot plan of care cert/re-cert  Impaired functional mobility and activity tolerance - Plan: Ot plan of care cert/re-cert  Impaired cognition - Plan: Ot plan of care cert/re-cert      Subjective Assessment - 09/03/14 0942    Symptoms He needs upper body strength to be able to take care of himself   Pertinent History see epic snapshot   Currently in Pain? Yes  when he dons prosthesis - see PT note          [facility] OT Assessment - 09/03/14 0943    Assessment   Diagnosis CVA  2009. Pt recently admitted to hospital and now deconditioned   Onset Date --  2009   Prior Therapy inpt rehab following stroke 2009-2010 then returned home.  Pt with second amputation after stroke with multiple medical issues.  Pt was in several facilities then came home on 05/2014.  First amputation occured 06/2013 and second amputation 08/2013,     Precautions   Precautions Other (comment)  No BP in LUE due to fistula   Restrictions   Weight Bearing Restrictions No   Balance Screen   Has the patient fallen in the past 6 months Yes  fell getting into car(slipped out of car) Pain now in LLE   How many times? [place] expects to be discharged to: Private residence   Living Arrangements Spouse/significant other   Type of [place] Access Level entry   [place] No   Additional Comments Pt can get into den but then has step into rest of house and therefore cannot access rest of house.   Prior Function   Level of  Independence Independent with basic ADLs;Independent with homemaking with ambulation   Vocation Retired   ADL   Eating/Feeding Modified independent   Grooming Set up   Upper Body Bathing Maximal assistance  per wife pt able to do alot of UB but doesn't want to   Where Assesed Upper Body Bathing Supine, head of bed up  in hospital bed   Lower Body Bathing + 1 Total assistance   Where Assesed-Lower Body Bathing Supine, Head of bed up   Upper Body Dressing Maximal assistance   Where Assessed-Upper Body Dressing Supine, head of bed up   Lower Body Dressing +1 Total aassistance   Where Assessd - Lower Body Dressing Supine,head of bed up   Toilet Tranfer + 1 Total assistance  pt goes in bed in diaper. Had 3  in 1 but does not use   Toileting - Clothing Manipulation + 1 Total assistance   Where Assessed - Toileting Clothing Manipulationn + 1 Total assistance   Equipment Used --  pt has standard 3 in1   ADL comments Pt cannot access bathroom; pt is highly dependent on wife even for tasks he may be able to do.     IADL   Shopping Completely unable to shop   [employer] Does not participate in any housekeeping tasks   Meal Prep Needs to have meals prepared and served   [employer] on family or friends for transportation   Medication Management Is not capable of dispensing or managing own medication   Financial Management Dependent   Mobility   Mobility Status History of falls   Written Expression   Dominant Hand Right   Handwriting 100% legible   Vision - History   Baseline Vision Wears glasses all the time   Vision Assessment   Comment Pt reports his acuity is not as good   Activity Tolerance   Activity Tolerance Tolerates 10-20 min activity with muiltiple rests   Sitting Balance Supports self independently with both upper extremities  only for very brief moments   Cognition   Attention Sustained;Selective   Sustained Attention Impaired   Sustained Attention  Impairment Verbal basic;Functional basic   Selective Attention Impaired   Selective Attention Impairment Verbal basic;Functional basic   Memory --  pt's wife reports baseline   Problem Solving Impaired   Problem Solving Impairment Verbal basic;Functional basic   Sensation   Light Touch Impaired by gross assessment   Hot/Cold Impaired by gross assessment   Proprioception Appears Intact   Coordination   Gross Motor Movements are Fluid and Coordinated Yes   Other Pt states he feels like he has some arthritis in both hands   Edema   Edema WNL'   Tone   Assessment Location Left Upper Extremity   AROM   Overall AROM  Within functional limits for tasks performed   Overall AROM Comments Pt has fisula in LUE   Strength   Overall Strength Within functional limits for tasks performed   Overall Strength Comments Pt with 4+/5 bilaterally with UE strength   Hand Function   Right Hand Gross Grasp Impaired   Right Hand Grip (lbs) 38 pounds   Left Hand Gross Grasp Impaired   Left Hand Grip (lbs) 35 pounds   Comment feel hand strength is due to generalized weakness and debility vs from stroke.    LUE Tone   LUE Tone Within Functional Limits                       OT Education - 09/03/14 1036    Education provided Yes   Education Details ADL HEP   Person(s) Educated Patient;Spouse   Methods Explanation;Demonstration   Comprehension Verbalized understanding          OT Short Term Goals - 09/03/14 1037    OT SHORT TERM GOAL #1   Title Pt and wife will be mod I with HEP - 10/01/2014   Status New   OT SHORT TERM GOAL #2   Title Pt will demonstrate increased grip strength bilaterally combined by at least 5 pounds (baseline = R= 38 pounds, L= 35 pounds)   Status New   OT SHORT TERM GOAL #3   Title Pt will demonstrate ability to  be min a with UB bathing at either bed  or w/c level   Status New   OT SHORT TERM GOAL #4   Title Pt will demonstrate ability to dress UB with mod  a at either bed or w/c level   Status New   OT SHORT TERM GOAL #5   Title Pt will be supervision for sitting balance with one UE support for at least 5 minutes of a task.   Status New           OT Long Term Goals - 09/07/2014 1040    OT LONG TERM GOAL #1   Title Pt and wife will be mod I with upgraded HEP - 10/29/2014   Status New   OT LONG TERM GOAL #2   Title Pt will demonstrate ability to be  be supervision with UB bathing w/c level   Status New   OT LONG TERM GOAL #3   Title Pt will demonstrate ability to be min a with LB bathing    Status New   OT LONG TERM GOAL #4   Title Pt will demonstrate ability to dress UB with supervision at w/c level   Status New   OT LONG TERM GOAL #5   Title Pt will demonstrate ability to dress LB with mod a    Status New   OT LONG TERM GOAL #6   Title Pt will demonstrate ability to be min a with transfers to 3 in 1 with prosthesis   Status New   OT LONG TERM GOAL #7   Title Pt will tolerate 30 min of functional activity with no  more than one rest break   Status New               Plan - 2014/09/07 1027    Clinical Impression Statement Pt is a 63 year old male s/p multiple medical issue including R CVA in 2009, left BKA in 06/2013 and R BKA in 08/2013.  Pt has been in and out of several facilities and was discharged home with his wifein 05/2014.  Pt presents today with the following impairments that impede pt's abiity to complete basic ADL's as follows:  overall debility and decreased activity tolerance, decreased sitting balance, decreased standing balance, mildly impaired sensation LUE, decreased grip strength bilatearlly (pt is R handed), impaired cognition, learned helplessness.    Rehab Potential Fair   Clinical Impairments Affecting Rehab Potential Pt can benefit from skilled OT to address the above deficits.   OT Frequency 2x / week   OT Duration 8 weeks   OT Treatment/Interventions Self-care/ADL training;DME and/or AE  instruction;Energy conservation;Therapeutic exercise;Therapist, nutritional;Therapeutic activities;Balance training;Patient/family education   Plan review UE strengthening program pt is doing at home and revise if necessary; futher assess sitting balance.   Consulted and Agree with Plan of Care Patient;Family member/caregiver   Family Member Consulted wife          G-Codes - Sep 07, 2014 1045    Functional Limitation Self care   Self Care Current Status [phone]) 100 percent impaired, limited or restricted   Self Care Goal Status ([id number]) At least 60 percent but less than 80 percent impaired, limited or restricted      Problem List Patient Active Problem List   Diagnosis Date Noted  . Diarrhea 08/14/2014  . Dehydration 10/02/2013  . Fever 10/02/2013  . Altered mental status 10/01/2013  . Encephalopathy, toxic 10/01/2013  . Encephalopathy, metabolic 00/34/9179  . CVA (cerebral vascular accident) 09/28/2013  . FTT (failure to thrive) in adult 09/28/2013  .  Acute confusional state 09/28/2013  . Ulcer of sacral region, stage 3 09/26/2013  . S/P BKA (below knee amputation) bilateral 09/08/2013  . ESRD on hemodialysis 05/09/2013  . TIA (transient ischemic attack) 02/20/2013  . Acute blood loss anemia 10/28/2012  . Chronic combined systolic (EF 34%) and grade 2diastolic congestive heart failure 10/28/2012  . Obstipation 10/28/2012  . GI bleed 10/27/2012  . Mass in rectum 10/27/2012  . DM (diabetes mellitus) type I controlled with renal manifestation 09/08/2012  . LVH (left ventricular hypertrophy)-severe concentric 06/13/2012  . Anemia due to chronic illness 06/12/2012  . Hyperlipidemia 01/30/2011  . CHOLELITHIASIS 08/01/2010  . BENIGN PROSTATIC HYPERTROPHY 08/01/2010  . CEREBROVASCULAR ACCIDENT, HX OF 08/06/2009  . Depression 03/18/2009  . Acute combined systolic and diastolic heart failure, NYHA class 2-EF 45% 03/18/2009  . NEPHROLITHIASIS, HX OF 03/18/2009  . Morbid obesity  03/25/2007  . Essential hypertension 03/25/2007  . GERD 03/25/2007  . HEPATITIS C, HX OF 03/25/2007    Quay Burow, OTR/L 09/03/2014, 10:46 AM  [facility] [address] [place], [place], [address] Phone: [phone]   Fax:  [phone]

## 2014-09-03 NOTE — Therapy (Signed)
[facility] [address] [place], [place], [address] Phone: [phone]   Fax:  [phone]  Physical Therapy Evaluation  Patient Details  Name: Oscar Castillo MRN: [id number] Date of Birth: 12/31/1950 Referring Provider:  [name], MD  Encounter Date: 09/03/2014      PT End of Session - 09/03/14 1912    Visit Number 1   Number of Visits 34   Date for PT Re-Evaluation 11/02/14   PT Start Time 4403   PT Stop Time 1100   PT Time Calculation (min) 45 min   Equipment Utilized During Treatment Gait belt   Activity Tolerance Patient tolerated treatment well   Behavior During Therapy [facility] for tasks assessed/performed      Past Medical History  Diagnosis Date  . ESRD on hemodialysis 05/05/2007    ESRD due to DM/HTN. Started dialysis in November 2013.  HD TTS at [facility] [place].  [name] BACK PAIN, LUMBAR, CHRONIC 08/06/2009  . BENIGN PROSTATIC HYPERTROPHY 08/01/2010  . CEREBROVASCULAR ACCIDENT, HX OF 08/06/2009  . CHOLELITHIASIS 08/01/2010  . CONGESTIVE HEART FAILURE 03/18/2009  . DEPRESSION 03/18/2009  . DIABETES MELLITUS, TYPE II 03/25/2007  . ERECTILE DYSFUNCTION 03/25/2007  . GERD 03/25/2007  . HEPATITIS C, HX OF 03/25/2007  . HYPERTENSION 03/25/2007  . Morbid obesity 03/25/2007  . NEPHROLITHIASIS, HX OF 03/18/2009  . Complication of anesthesia     wife states pt had trouble waking up with his last surgery in Nov., 2014    Past Surgical History  Procedure Laterality Date  . Nephrectomy      partial RR  . Av fistula placement  06/14/2012    Procedure: ARTERIOVENOUS (AV) FISTULA CREATION;  Surgeon: Angelia Mould, MD;  Location: [facility] OR;  Service: Vascular;  Laterality: Left;  Left basilic vein transposition with fistula.  . Tibia im nail insertion Left 09/09/2012    Procedure: INTRAMEDULLARY (IM) NAIL TIBIAL;  Surgeon: Johnny Bridge, MD;  Location: [facility];  Service: Orthopedics;  Laterality: Left;  left tibial  nail and open reduction internal fixation left fibula fracture  . Orif fibula fracture Left 09/09/2012    Procedure: OPEN REDUCTION INTERNAL FIXATION (ORIF) FIBULA FRACTURE;  Surgeon: Johnny Bridge, MD;  Location: [place];  Service: Orthopedics;  Laterality: Left;  . Colonoscopy N/A 10/28/2012    Procedure: COLONOSCOPY;  Surgeon: Jeryl Columbia, MD;  Location: [facility] ENDOSCOPY;  Service: Endoscopy;  Laterality: N/A;  . Esophagogastroduodenoscopy N/A 11/02/2012    Procedure: ESOPHAGOGASTRODUODENOSCOPY (EGD);  Surgeon: Cleotis Nipper, MD;  Location: [facility] ENDOSCOPY;  Service: Endoscopy;  Laterality: N/A;  . Colonoscopy N/A 11/02/2012    Procedure: COLONOSCOPY;  Surgeon: Cleotis Nipper, MD;  Location: [facility] ENDOSCOPY;  Service: Endoscopy;  Laterality: N/A;  . Colonoscopy N/A 11/03/2012    Procedure: COLONOSCOPY;  Surgeon: Cleotis Nipper, MD;  Location: [facility] ENDOSCOPY;  Service: Endoscopy;  Laterality: N/A;  . Givens capsule study N/A 11/04/2012    Procedure: GIVENS CAPSULE STUDY;  Surgeon: Cleotis Nipper, MD;  Location: [facility] ENDOSCOPY;  Service: Endoscopy;  Laterality: N/A;  . Enteroscopy N/A 11/08/2012    Procedure: ENTEROSCOPY;  Surgeon: Wonda Horner, MD;  Location: [facility] ENDOSCOPY;  Service: Endoscopy;  Laterality: N/A;  . Amputation Left 05/12/2013    Procedure: AMPUTATION RAY;  Surgeon: Newt Minion, MD;  Location: [facility];  Service: Orthopedics;  Laterality: Left;  Left Foot 1st Ray Amputation  . Eye surgery Left  to remove scar tissue  . Amputation Left 06/09/2013    Procedure: AMPUTATION BELOW KNEE;  Surgeon: Newt Minion, MD;  Location: [facility];  Service: Orthopedics;  Laterality: Left;  Left Below Knee Amputation and removal proximal screws IM tibial nail  . Hardware removal Left 06/09/2013    Procedure: HARDWARE REMOVAL;  Surgeon: Newt Minion, MD;  Location: [place];  Service: Orthopedics;  Laterality: Left;  Left Below Knee Amputation  and Removal proximal screws IM tibial nail  . Amputation Right  09/08/2013    Procedure: AMPUTATION BELOW KNEE;  Surgeon: Newt Minion, MD;  Location: [place];  Service: Orthopedics;  Laterality: Right;  Right Below Knee Amputation  . Amputation Right 10/11/2013    Procedure: AMPUTATION BELOW KNEE;  Surgeon: Newt Minion, MD;  Location: [facility] [place];  Service: Orthopedics;  Laterality: Right;  Right Below Knee Amputation Revision    There were no vitals taken for this visit.  Visit Diagnosis:  Generalized weakness  Debility  Impaired functional mobility and activity tolerance  Abnormality of gait  Balance problems  Status post bilateral below knee amputation      Subjective Assessment - 09/03/14 1023    Symptoms His left Transtibial Amputation 10/14 (fx in Feb 2014) and right Transtibial Amputation 2/15. Hx of CVA 2009. He recieved prostheses Aug. 2015. He had therapies until discharge [facility] discharge 06/25/14. He presents for PT & OT  evaluations.    Patient Stated Goals He wants to walk with prostheses, move more smoothly (transfer), gain independence   Currently in Pain? Yes   Pain Location Back   Pain Descriptors / Indicators Throbbing   Pain Onset More than a month ago   Pain Frequency Intermittent   Aggravating Factors  unknown   Pain Relieving Factors lay down   Multiple Pain Sites No          [facility] PT Assessment - 09/03/14 1015    Assessment   Medical Diagnosis Bil. TTA, CVA   Precautions   Precautions Fall;Other (comment)  no BP L UE   Restrictions   Weight Bearing Restrictions No   Balance Screen   Has the patient fallen in the past 6 months Yes   How many times? 1  transfering wc to car   Has the patient had a decrease in activity level because of a fear of falling?  Yes   Is the patient reluctant to leave their home because of a fear of falling?  Yes   [place] Private residence   Living Arrangements Spouse/significant other   Type of [place] Access Level entry  no steps from  car to [employer] One level  single step from den to remainder of home   [place] - 2 wheels;Cane - single point;Crutches;Bedside commode;Shower seat;Tub bench;Wheelchair - manual;Hospital bed  BSC no drop arms   Prior Function   Level of Independence Independent with basic ADLs;Independent with homemaking with ambulation;Independent with gait;Independent with transfers  prior to amputations (after CVA) no limitations   Vocation Retired   AROM   Right Knee Extension -29   PROM   Left Knee Extension -36   Strength   Right Hip Flexion 3-/5   Right Hip Extension 2-/5   Right Hip ABduction 2+/5   Left Hip Flexion 3-/5   Left Hip Extension 2-/5   Left Hip ABduction 2+/5   Right Knee Flexion 2+/5   Right  Knee Extension 3-/5   Left Knee Flexion 2+/5   Left Knee Extension 3-/5   Transfers   Sit to Stand 2: Max assist;With upper extremity assist;With armrests;From chair/3-in-1  pullig on parallel bars with PT blocking knees   Stand to Sit 3: Mod assist;With upper extremity assist;With armrests;To chair/3-in-1  holding parallel bars using UEs to control descent   Ambulation/Gait   Ambulation/Gait No   Static Standing Balance   Static Standing - Balance Support Bilateral upper extremity supported  parallel bars   Static Standing - Level of Assistance 3: Mod assist  PT blocking knees and manually assisting wt forward over fee   Static Standing - Comment/# of Minutes 2 minutes   Dynamic Standing Balance   Dynamic Standing - Balance Support Bilateral upper extremity supported  parallel bars   Dynamic Standing - Level of Assistance 2: Max assist   Dynamic Standing - Balance Activities Head turns;Head nods  looking up, down, right, left, and sliding hand 2" along bar   Dynamic Standing - Comments PT manually shifting wt over prostheses & blocking knees         Prosthetics Assessment - 09/03/14 0001    Prosthetic Care Dependent with Skin check;Residual limb  care;Care of non-amputated limb;Prosthetic cleaning;Ply sock cleaning;Correct ply sock adjustment;Proper wear schedule/adjustment;Proper weight-bearing schedule/adjustment   Donning prosthesis  +1 Total assist   Doffing prosthesis  Max assist   Current prosthetic wear tolerance (days/week)  3-6 days/wk  wears to/from dialysis & non-dialysis days only if going out   Current prosthetic wear tolerance (#hours/day)  1-2 hours, 1-2 X /day   Current prosthetic weight-bearing tolerance (hours/day)  2 minutes with no c/o pain   Edema pitting   Residual limb condition  scars present but no openings                           PT Short Term Goals - 09/03/14 1924    PT SHORT TERM GOAL #1   Title donnes prostheses with minimal assist (Target Date: 10/02/14)   Time 1   Period Months   Status New   PT SHORT TERM GOAL #2   Title wife & patient verbalize adjusting wear schedule to maximize functional potential. (Target Date: 10/02/14)   Time 1   Period Months   Status New   PT SHORT TERM GOAL #3   Title performs squat-pivot transfer w/c to level mat with minimal assist. (Target Date: 10/02/14)   Time 1   Period Months   Status New   PT SHORT TERM GOAL #4   Title Sit to/from stand w/c to parallel bars with moderate assist (Target Date: 10/02/14)   Time 1   Period Months   Status New   PT SHORT TERM GOAL #5   Title patient & wife demonstrate initial HEP correctly. (Target Date: 10/02/14)   Time 1   Period Months   Status New           PT Long Term Goals - 09/03/14 1015    PT LONG TERM GOAL #1   Title Patient and wife demonstrate proper prosthetic care. (Target Date: 01/02/15)   Time 4   Period Months   Status New   PT LONG TERM GOAL #2   Title tolerates wear of prostheses >90% of awake hours except at dialysis (Target Date: 01/02/15)   Time 4   Period Months   Status New   PT LONG TERM GOAL #3  Title stand pivot transfers with rolling walker & prostheses modified  independent.  (Target Date: 01/02/15)   Time 4   Period Months   Status New   PT LONG TERM GOAL #4   Title manages clothes, reaches 5" and in upper /lower cabinets in standing with UE support modified independent.  (Target Date: 01/02/15)   Time 4   Period Months   Status New   PT LONG TERM GOAL #5   Title ambulate 44' with rolling walker & prostheses with minimal assist.  (Target Date: 01/02/15)   Time 4   Period Months   Status New               Plan - 01-Oct-2014 1915    Clinical Impression Statement This 64yo male was highly functioning with PMH of CVA in 2009 and ESRD with walking in/out of dialysis and active on dialysis days. He underwent a left Transtibial Amputation 06/09/13 and before he could begin prosthetic training, he underwent a right Transtibial Amputation on 09/08/13 with revision 10/11/13. He recieved prostheses while in rehab nursing home in Sept/Oct 2015. He was discharged 06/25/14 from rehab nursing home. He presents for PT & OT evaluations.    Pt will benefit from skilled therapeutic intervention in order to improve on the following deficits Abnormal gait;Decreased activity tolerance;Decreased balance;Decreased endurance;Decreased knowledge of use of DME;Decreased mobility;Decreased range of motion;Decreased strength;Difficulty walking;Other (comment)  prosthetic dependency   Rehab Potential Good   Clinical Impairments Affecting Rehab Potential multiple medical issues over last year causing decondtioning   PT Frequency 2x / week   PT Duration Other (comment)  4 months   PT Treatment/Interventions ADLs/Self Care Home Management;Gait training;Stair training;DME Instruction;Functional mobility training;Therapeutic activities;Therapeutic exercise;Balance training;Neuromuscular re-education;Patient/family education;Manual techniques;Other (comment)  prosthetic training   PT Next Visit Plan prosthetic education, HEP   PT Home Exercise Plan hip & knee ROM & strength     Consulted and Agree with Plan of Care Patient;Family member/caregiver   Family Member Consulted wife          G-Codes - 2014-10-01 1015    Functional Assessment Tool Used totally dependent in donning prostheses, dependent in prosthetic care   Functional Limitation Self care   Self Care Current Status [phone]) 100 percent impaired, limited or restricted   Self Care Goal Status ([id number]) At least 20 percent but less than 40 percent impaired, limited or restricted       Problem List Patient Active Problem List   Diagnosis Date Noted  . Diarrhea 08/14/2014  . Dehydration 10/02/2013  . Fever 10/02/2013  . Altered mental status 10/01/2013  . Encephalopathy, toxic 10/01/2013  . Encephalopathy, metabolic 33/82/5053  . CVA (cerebral vascular accident) 09/28/2013  . FTT (failure to thrive) in adult 09/28/2013  . Acute confusional state 09/28/2013  . Ulcer of sacral region, stage 3 09/26/2013  . S/P BKA (below knee amputation) bilateral 09/08/2013  . ESRD on hemodialysis 05/09/2013  . TIA (transient ischemic attack) 02/20/2013  . Acute blood loss anemia 10/28/2012  . Chronic combined systolic (EF 97%) and grade 2diastolic congestive heart failure 10/28/2012  . Obstipation 10/28/2012  . GI bleed 10/27/2012  . Mass in rectum 10/27/2012  . DM (diabetes mellitus) type I controlled with renal manifestation 09/08/2012  . LVH (left ventricular hypertrophy)-severe concentric 06/13/2012  . Anemia due to chronic illness 06/12/2012  . Hyperlipidemia 01/30/2011  . CHOLELITHIASIS 08/01/2010  . BENIGN PROSTATIC HYPERTROPHY 08/01/2010  . CEREBROVASCULAR ACCIDENT, HX OF 08/06/2009  . Depression 03/18/2009  .  Acute combined systolic and diastolic heart failure, NYHA class 2-EF 45% 03/18/2009  . NEPHROLITHIASIS, HX OF 03/18/2009  . Morbid obesity 03/25/2007  . Essential hypertension 03/25/2007  . GERD 03/25/2007  . HEPATITIS C, HX OF 03/25/2007    Jamey Reas PT, DPT Physical Therapist  Specializing in Prosthetics 09/03/2014, 7:44 PM  [facility] [address] [place], [place], [address] Phone: [phone]   Fax:  [phone]

## 2014-09-03 NOTE — Patient Instructions (Signed)
Had very direct conversation with pt to discuss that independence in self care skills needed to be his goal and not just his wife's goal or an "OT " goal.  Asked pt as part of his HEP to begin washing his face, chest, arms and belly in supine with HOB raised at home instead of letting his wife do it.  Initially pt stated "but I have dialysis tomorrow." Again reiterated that pt has to desire more independence in these activities for therapy to be beneficial. Pt verbalized understanding and responded that he would try.  Wife present and in agreement.

## 2014-09-04 DIAGNOSIS — E1122 Type 2 diabetes mellitus with diabetic chronic kidney disease: Secondary | ICD-10-CM | POA: Diagnosis not present

## 2014-09-04 DIAGNOSIS — N2581 Secondary hyperparathyroidism of renal origin: Secondary | ICD-10-CM | POA: Diagnosis not present

## 2014-09-04 DIAGNOSIS — N186 End stage renal disease: Secondary | ICD-10-CM | POA: Diagnosis not present

## 2014-09-04 DIAGNOSIS — D631 Anemia in chronic kidney disease: Secondary | ICD-10-CM | POA: Diagnosis not present

## 2014-09-06 DIAGNOSIS — E1122 Type 2 diabetes mellitus with diabetic chronic kidney disease: Secondary | ICD-10-CM | POA: Diagnosis not present

## 2014-09-06 DIAGNOSIS — N2581 Secondary hyperparathyroidism of renal origin: Secondary | ICD-10-CM | POA: Diagnosis not present

## 2014-09-06 DIAGNOSIS — N186 End stage renal disease: Secondary | ICD-10-CM | POA: Diagnosis not present

## 2014-09-06 DIAGNOSIS — D631 Anemia in chronic kidney disease: Secondary | ICD-10-CM | POA: Diagnosis not present

## 2014-09-08 DIAGNOSIS — D631 Anemia in chronic kidney disease: Secondary | ICD-10-CM | POA: Diagnosis not present

## 2014-09-08 DIAGNOSIS — N2581 Secondary hyperparathyroidism of renal origin: Secondary | ICD-10-CM | POA: Diagnosis not present

## 2014-09-08 DIAGNOSIS — N186 End stage renal disease: Secondary | ICD-10-CM | POA: Diagnosis not present

## 2014-09-08 DIAGNOSIS — E1122 Type 2 diabetes mellitus with diabetic chronic kidney disease: Secondary | ICD-10-CM | POA: Diagnosis not present

## 2014-09-10 ENCOUNTER — Telehealth: Payer: Self-pay | Admitting: Internal Medicine

## 2014-09-10 ENCOUNTER — Ambulatory Visit: Payer: Medicare Other | Admitting: Occupational Therapy

## 2014-09-10 ENCOUNTER — Other Ambulatory Visit: Payer: Self-pay

## 2014-09-10 ENCOUNTER — Ambulatory Visit: Payer: Medicare Other | Admitting: Physical Therapy

## 2014-09-10 MED ORDER — HYDRALAZINE HCL 100 MG PO TABS: 100.0000 mg | ORAL_TABLET | Freq: Three times a day (TID) | ORAL | Status: DC

## 2014-09-10 NOTE — Telephone Encounter (Signed)
Done erx 

## 2014-09-11 DIAGNOSIS — E1122 Type 2 diabetes mellitus with diabetic chronic kidney disease: Secondary | ICD-10-CM | POA: Diagnosis not present

## 2014-09-11 DIAGNOSIS — D631 Anemia in chronic kidney disease: Secondary | ICD-10-CM | POA: Diagnosis not present

## 2014-09-11 DIAGNOSIS — N186 End stage renal disease: Secondary | ICD-10-CM | POA: Diagnosis not present

## 2014-09-11 DIAGNOSIS — N2581 Secondary hyperparathyroidism of renal origin: Secondary | ICD-10-CM | POA: Diagnosis not present

## 2014-09-12 ENCOUNTER — Encounter: Payer: Self-pay | Admitting: Physical Therapy

## 2014-09-12 ENCOUNTER — Ambulatory Visit: Payer: Medicare Other | Admitting: Physical Therapy

## 2014-09-12 ENCOUNTER — Ambulatory Visit: Payer: Medicare Other | Admitting: Occupational Therapy

## 2014-09-12 DIAGNOSIS — R2689 Other abnormalities of gait and mobility: Secondary | ICD-10-CM

## 2014-09-12 DIAGNOSIS — Z89512 Acquired absence of left leg below knee: Secondary | ICD-10-CM

## 2014-09-12 DIAGNOSIS — Z89511 Acquired absence of right leg below knee: Secondary | ICD-10-CM | POA: Diagnosis not present

## 2014-09-12 DIAGNOSIS — R5381 Other malaise: Secondary | ICD-10-CM | POA: Diagnosis not present

## 2014-09-12 DIAGNOSIS — R531 Weakness: Secondary | ICD-10-CM | POA: Diagnosis not present

## 2014-09-12 DIAGNOSIS — R4189 Other symptoms and signs involving cognitive functions and awareness: Secondary | ICD-10-CM | POA: Diagnosis not present

## 2014-09-12 DIAGNOSIS — Z7409 Other reduced mobility: Secondary | ICD-10-CM | POA: Diagnosis not present

## 2014-09-12 DIAGNOSIS — R269 Unspecified abnormalities of gait and mobility: Secondary | ICD-10-CM

## 2014-09-12 NOTE — Patient Instructions (Addendum)
1. Grip Strengthening (Resistive Putty)   Squeeze putty using thumb and all fingers. Repeat _20___ times. Do __2__ sessions per day.   2. Roll putty into tube on table and pinch between each finger and thumb x 10 reps each. (can do ring and small finger together)     Copyright  VHI. All rights reserved.         Resisted Horizontal Abduction: Bilateral   Sit or stand, tubing in both hands, arms out in front. Keeping arms straight, pinch shoulder blades together and stretch arms out. Repeat _10___ times per set. Do _1-2___ sessions per day, every other day.         Elbow Extension: Resisted   Sit in chair with resistive band secured at armrest (or hold with other hand) and __each_____ elbow bent. Straighten elbow. Repeat _10___ times per set.  Do _1-2___ sessions per day, every other day.   Perform biceps curls 10 reps with 2 lbs weight 1x day   Copyright  VHI. All rights reserved.

## 2014-09-12 NOTE — Patient Instructions (Signed)
BED MOBILITY: Rolling   Bend knees and hips; reach arm across chest. Roll to side. 5___ reps per set, __1_ sets per day. Roll to each side. Can practice this with or without prostheses.  Copyright  VHI. All rights reserved.  Lower Trunk Rotation Stretch   Keeping back flat and feet together, rotate knees to left side. Hold __5__ seconds. Repeat __10__ times per set. Do _1___ sets per session. Do __1-2__ sessions per day.  http://orth.exer.us/122   Copyright  VHI. All rights reserved.  Bridging   Slowly raise buttocks from floor, keeping stomach tight. Repeat _10___ times per set. Do __1__ sets per session. Do _1-2___ sessions per day.  http://orth.exer.us/1096   Copyright  VHI. All rights reserved.  Straight Leg Raise   Tighten stomach and slowly raise locked one leg _1-2___ inches from floor. Will be easier without prostheses on for now, or have help if wearing them. Repeat _10___ times per leg. Do __1__ sets per session. Do 1-2 sessions per day.  http://orth.exer.us/1102   Copyright  VHI. All rights reserved.  Strengthening: Hip Abductor - Resisted   With band looped around both legs above knees, push thighs apart. Hold 5 seconds. Repeat _10___ times per set. Do __1__ sets per session. Do _1-2___ sessions per day.  http://orth.exer.us/688   Copyright  VHI. All rights reserved.  Quad Set / Hip Extension   With towel roll under calf of residual limb, tighten thigh muscle to straighten knee. Push down into towel roll to lift buttocks. Hold _5___ seconds. Repeat 10_ times. Do _1-2___ sessions per day.  Copyright  VHI. All rights reserved.

## 2014-09-12 NOTE — Therapy (Signed)
[facility] [address] [place], [place], [address] Phone: [phone]   Fax:  [phone]  Physical Therapy Treatment  Patient Details  Name: Oscar Castillo MRN: [id number] Date of Birth: 07-06-1951 Referring Provider:  [name], MD  Encounter Date: 09/12/2014      PT End of Session - 09/12/14 0933    Visit Number 2   Number of Visits 34   Date for PT Re-Evaluation 11/02/14   PT Start Time 0930   PT Stop Time 1015   PT Time Calculation (min) 45 min   Equipment Utilized During Treatment Gait belt   Activity Tolerance Patient tolerated treatment well   Behavior During Therapy [facility] for tasks assessed/performed      Past Medical History  Diagnosis Date  . ESRD on hemodialysis 05/05/2007    ESRD due to DM/HTN. Started dialysis in November 2013.  HD TTS at [facility] on [place].  [name] BACK PAIN, LUMBAR, CHRONIC 08/06/2009  . BENIGN PROSTATIC HYPERTROPHY 08/01/2010  . CEREBROVASCULAR ACCIDENT, HX OF 08/06/2009  . CHOLELITHIASIS 08/01/2010  . CONGESTIVE HEART FAILURE 03/18/2009  . DEPRESSION 03/18/2009  . DIABETES MELLITUS, TYPE II 03/25/2007  . ERECTILE DYSFUNCTION 03/25/2007  . GERD 03/25/2007  . HEPATITIS C, HX OF 03/25/2007  . HYPERTENSION 03/25/2007  . Morbid obesity 03/25/2007  . NEPHROLITHIASIS, HX OF 03/18/2009  . Complication of anesthesia     wife states pt had trouble waking up with his last surgery in Nov., 2014    Past Surgical History  Procedure Laterality Date  . Nephrectomy      partial RR  . Av fistula placement  06/14/2012    Procedure: ARTERIOVENOUS (AV) FISTULA CREATION;  Surgeon: Angelia Mould, MD;  Location: [facility] OR;  Service: Vascular;  Laterality: Left;  Left basilic vein transposition with fistula.  . Tibia im nail insertion Left 09/09/2012    Procedure: INTRAMEDULLARY (IM) NAIL TIBIAL;  Surgeon: Johnny Bridge, MD;  Location: [place];  Service: Orthopedics;  Laterality: Left;  left tibial  nail and open reduction internal fixation left fibula fracture  . Orif fibula fracture Left 09/09/2012    Procedure: OPEN REDUCTION INTERNAL FIXATION (ORIF) FIBULA FRACTURE;  Surgeon: Johnny Bridge, MD;  Location: [place];  Service: Orthopedics;  Laterality: Left;  . Colonoscopy N/A 10/28/2012    Procedure: COLONOSCOPY;  Surgeon: Jeryl Columbia, MD;  Location: [facility] ENDOSCOPY;  Service: Endoscopy;  Laterality: N/A;  . Esophagogastroduodenoscopy N/A 11/02/2012    Procedure: ESOPHAGOGASTRODUODENOSCOPY (EGD);  Surgeon: Cleotis Nipper, MD;  Location: [facility] ENDOSCOPY;  Service: Endoscopy;  Laterality: N/A;  . Colonoscopy N/A 11/02/2012    Procedure: COLONOSCOPY;  Surgeon: Cleotis Nipper, MD;  Location: [facility] ENDOSCOPY;  Service: Endoscopy;  Laterality: N/A;  . Colonoscopy N/A 11/03/2012    Procedure: COLONOSCOPY;  Surgeon: Cleotis Nipper, MD;  Location: [facility] ENDOSCOPY;  Service: Endoscopy;  Laterality: N/A;  . Givens capsule study N/A 11/04/2012    Procedure: GIVENS CAPSULE STUDY;  Surgeon: Cleotis Nipper, MD;  Location: [facility] ENDOSCOPY;  Service: Endoscopy;  Laterality: N/A;  . Enteroscopy N/A 11/08/2012    Procedure: ENTEROSCOPY;  Surgeon: Wonda Horner, MD;  Location: [facility] ENDOSCOPY;  Service: Endoscopy;  Laterality: N/A;  . Amputation Left 05/12/2013    Procedure: AMPUTATION RAY;  Surgeon: Newt Minion, MD;  Location: [place];  Service: Orthopedics;  Laterality: Left;  Left Foot 1st Ray Amputation  . Eye surgery Left  to remove scar tissue  . Amputation Left 06/09/2013    Procedure: AMPUTATION BELOW KNEE;  Surgeon: Newt Minion, MD;  Location: [place];  Service: Orthopedics;  Laterality: Left;  Left Below Knee Amputation and removal proximal screws IM tibial nail  . Hardware removal Left 06/09/2013    Procedure: HARDWARE REMOVAL;  Surgeon: Newt Minion, MD;  Location: [place];  Service: Orthopedics;  Laterality: Left;  Left Below Knee Amputation  and Removal proximal screws IM tibial nail  . Amputation Right  09/08/2013    Procedure: AMPUTATION BELOW KNEE;  Surgeon: Newt Minion, MD;  Location: [place];  Service: Orthopedics;  Laterality: Right;  Right Below Knee Amputation  . Amputation Right 10/11/2013    Procedure: AMPUTATION BELOW KNEE;  Surgeon: Newt Minion, MD;  Location: [place];  Service: Orthopedics;  Laterality: Right;  Right Below Knee Amputation Revision    There were no vitals taken for this visit.  Visit Diagnosis:  Abnormality of gait  Balance problems  Status post bilateral below knee amputation  Generalized weakness      Subjective Assessment - 09/12/14 0932    Symptoms No new compliants. No falls. No pain currently.          [place] Adult PT Treatment/Exercise - 09/12/14 0001    Transfers   Lateral/Scoot Transfers 4: Min assist;With armrests removed;2: Max assist  with bil prostheses on   Lateral/Scoot Transfer Details (indicate cue type and reason) min assist for wheelchair to mat and then max assist for mat to wheelchair after session. cues on bil foot and had position and assist needed to lift buttocks to clear surface for scooting.                                             Prosthetics   Current prosthetic wear tolerance (days/week)  7 days   Current prosthetic wear tolerance (#hours/day)  3-4 hours straight   Residual limb condition  scars present but no openings   Education Provided Residual limb care;Proper wear schedule/adjustment;Skin check   Person(s) Educated Patient;Spouse   Education Method Explanation;Verbal cues   Education Method Verbalized understanding   Donning Prosthesis Moderate assist  spouse assists   Doffing Prosthesis Moderate assist  spouse assists     Exercise - rolling right and left x 5 reps each - lower trunk rotation 5 sec hold x 10 reps each way - bridge x 10 reps (minimal clearance noted from mat) \- straight leg raises with prostheses on x 10 reps each leg with active assist - supine hip abduct/ER (clam shell) with red band x  10 reps with 5 sec hold each - with prostheses removed: hip extension with quad set 5 sec hold x 10 reps, towel roll under calf for these  Transfers cont:  -min assist with cues to go from sit to supine - max assist with cues to go from supine to sitting at edge of mat        PT Education - 09/12/14 1007    Education Details Hep   Person(s) Educated Patient;Spouse   Methods Explanation;Demonstration;Handout   Comprehension Verbalized understanding;Need further instruction;Verbal cues required          PT Short Term Goals - 09/03/14 1924    PT SHORT TERM GOAL #1   Title donnes prostheses with minimal assist (Target Date: 10/02/14)  Time 1   Period Months   Status New   PT SHORT TERM GOAL #2   Title wife & patient verbalize adjusting wear schedule to maximize functional potential. (Target Date: 10/02/14)   Time 1   Period Months   Status New   PT SHORT TERM GOAL #3   Title performs squat-pivot transfer w/c to level mat with minimal assist. (Target Date: 10/02/14)   Time 1   Period Months   Status New   PT SHORT TERM GOAL #4   Title Sit to/from stand w/c to parallel bars with moderate assist (Target Date: 10/02/14)   Time 1   Period Months   Status New   PT SHORT TERM GOAL #5   Title patient & wife demonstrate initial HEP correctly. (Target Date: 10/02/14)   Time 1   Period Months   Status New          PT Long Term Goals - 09/03/14 1015    PT LONG TERM GOAL #1   Title Patient and wife demonstrate proper prosthetic care. (Target Date: 01/02/15)   Time 4   Period Months   Status New   PT LONG TERM GOAL #2   Title tolerates wear of prostheses >90% of awake hours except at dialysis (Target Date: 01/02/15)   Time 4   Period Months   Status New   PT LONG TERM GOAL #3   Title stand pivot transfers with rolling walker & prostheses modified independent.  (Target Date: 01/02/15)   Time 4   Period Months   Status New   PT LONG TERM GOAL #4   Title manages clothes, reaches 5"  and in upper /lower cabinets in standing with UE support modified independent.  (Target Date: 01/02/15)   Time 4   Period Months   Status New   PT LONG TERM GOAL #5   Title ambulate 47' with rolling walker & prostheses with minimal assist.  (Target Date: 01/02/15)   Time 4   Period Months   Status New           Plan - 09/12/14 0933    Clinical Impression Statement Pt and spouse educated on bed exercises for home for strengthening and flexibility. Pt quick to fatique with session today. Spouse reports that bed mobility is difficult for pt at home, pt supervision with rollling today and max assist to sit up with out a bed rail (has hospital bed at home). They are to bring in pictures of both the hospital bed and his regular bed for assitance with further training on bed mobility.              Pt will benefit from skilled therapeutic intervention in order to improve on the following deficits Abnormal gait;Decreased activity tolerance;Decreased balance;Decreased endurance;Decreased knowledge of use of DME;Decreased mobility;Decreased range of motion;Decreased strength;Difficulty walking;Other (comment)  prosthetic dependency   Rehab Potential Good   Clinical Impairments Affecting Rehab Potential multiple medical issues over last year causing decondtioning   PT Frequency 2x / week   PT Duration Other (comment)  4 months   PT Treatment/Interventions ADLs/Self Care Home Management;Gait training;Stair training;DME Instruction;Functional mobility training;Therapeutic activities;Therapeutic exercise;Balance training;Neuromuscular re-education;Patient/family education;Manual techniques;Other (comment)  prosthetic training   PT Next Visit Plan bed mobility training, strengthening and flexibility exercises on mat both with and without bil prostheses   PT Home Exercise Plan hip & knee ROM & strength    Consulted and Agree with Plan of Care Patient;Family member/caregiver   Family Member  Consulted wife       Problem List Patient Active Problem List   Diagnosis Date Noted  . Diarrhea 08/14/2014  . Dehydration 10/02/2013  . Fever 10/02/2013  . Altered mental status 10/01/2013  . Encephalopathy, toxic 10/01/2013  . Encephalopathy, metabolic 35/32/9924  . CVA (cerebral vascular accident) 09/28/2013  . FTT (failure to thrive) in adult 09/28/2013  . Acute confusional state 09/28/2013  . Ulcer of sacral region, stage 3 09/26/2013  . S/P BKA (below knee amputation) bilateral 09/08/2013  . ESRD on hemodialysis 05/09/2013  . TIA (transient ischemic attack) 02/20/2013  . Acute blood loss anemia 10/28/2012  . Chronic combined systolic (EF 26%) and grade 2diastolic congestive heart failure 10/28/2012  . Obstipation 10/28/2012  . GI bleed 10/27/2012  . Mass in rectum 10/27/2012  . DM (diabetes mellitus) type I controlled with renal manifestation 09/08/2012  . LVH (left ventricular hypertrophy)-severe concentric 06/13/2012  . Anemia due to chronic illness 06/12/2012  . Hyperlipidemia 01/30/2011  . CHOLELITHIASIS 08/01/2010  . BENIGN PROSTATIC HYPERTROPHY 08/01/2010  . CEREBROVASCULAR ACCIDENT, HX OF 08/06/2009  . Depression 03/18/2009  . Acute combined systolic and diastolic heart failure, NYHA class 2-EF 45% 03/18/2009  . NEPHROLITHIASIS, HX OF 03/18/2009  . Morbid obesity 03/25/2007  . Essential hypertension 03/25/2007  . GERD 03/25/2007  . HEPATITIS C, HX OF 03/25/2007    Willow Ora 09/12/2014, 11:49 AM  Willow Ora, PTA, [facility] [address] [place],  [phone] 09/12/2014, 11:50 AM

## 2014-09-13 DIAGNOSIS — D631 Anemia in chronic kidney disease: Secondary | ICD-10-CM | POA: Diagnosis not present

## 2014-09-13 DIAGNOSIS — E1122 Type 2 diabetes mellitus with diabetic chronic kidney disease: Secondary | ICD-10-CM | POA: Diagnosis not present

## 2014-09-13 DIAGNOSIS — N2581 Secondary hyperparathyroidism of renal origin: Secondary | ICD-10-CM | POA: Diagnosis not present

## 2014-09-13 DIAGNOSIS — N186 End stage renal disease: Secondary | ICD-10-CM | POA: Diagnosis not present

## 2014-09-13 NOTE — Therapy (Signed)
[facility] [address] [place], [place], [address] Phone: [phone]   Fax:  [phone]  Occupational Therapy Treatment  Patient Details  Name: Oscar Castillo MRN: [id number] Date of Birth: 09/09/1950 Referring Provider:  [name], MD  Encounter Date: 09/12/2014      OT End of Session - 09/12/14 0856    Visit Number 2   Number of Visits 16   Date for OT Re-Evaluation 10/29/14   Authorization Type medicare needs G code   OT Start Time 0854   OT Stop Time 0930   OT Time Calculation (min) 36 min   Activity Tolerance Patient tolerated treatment well   Behavior During Therapy [facility] for tasks assessed/performed      Past Medical History  Diagnosis Date  . ESRD on hemodialysis 05/05/2007    ESRD due to DM/HTN. Started dialysis in November 2013.  HD TTS at [facility] on [place].  [name] BACK PAIN, LUMBAR, CHRONIC 08/06/2009  . BENIGN PROSTATIC HYPERTROPHY 08/01/2010  . CEREBROVASCULAR ACCIDENT, HX OF 08/06/2009  . CHOLELITHIASIS 08/01/2010  . CONGESTIVE HEART FAILURE 03/18/2009  . DEPRESSION 03/18/2009  . DIABETES MELLITUS, TYPE II 03/25/2007  . ERECTILE DYSFUNCTION 03/25/2007  . GERD 03/25/2007  . HEPATITIS C, HX OF 03/25/2007  . HYPERTENSION 03/25/2007  . Morbid obesity 03/25/2007  . NEPHROLITHIASIS, HX OF 03/18/2009  . Complication of anesthesia     wife states pt had trouble waking up with his last surgery in Nov., 2014    Past Surgical History  Procedure Laterality Date  . Nephrectomy      partial RR  . Av fistula placement  06/14/2012    Procedure: ARTERIOVENOUS (AV) FISTULA CREATION;  Surgeon: Angelia Mould, MD;  Location: [facility] OR;  Service: Vascular;  Laterality: Left;  Left basilic vein transposition with fistula.  . Tibia im nail insertion Left 09/09/2012    Procedure: INTRAMEDULLARY (IM) NAIL TIBIAL;  Surgeon: Johnny Bridge, MD;  Location: [place];  Service: Orthopedics;  Laterality: Left;  left tibial  nail and open reduction internal fixation left fibula fracture  . Orif fibula fracture Left 09/09/2012    Procedure: OPEN REDUCTION INTERNAL FIXATION (ORIF) FIBULA FRACTURE;  Surgeon: Johnny Bridge, MD;  Location: [place];  Service: Orthopedics;  Laterality: Left;  . Colonoscopy N/A 10/28/2012    Procedure: COLONOSCOPY;  Surgeon: Jeryl Columbia, MD;  Location: [facility] ENDOSCOPY;  Service: Endoscopy;  Laterality: N/A;  . Esophagogastroduodenoscopy N/A 11/02/2012    Procedure: ESOPHAGOGASTRODUODENOSCOPY (EGD);  Surgeon: Cleotis Nipper, MD;  Location[facility] ENDOSCOPY;  Service: Endoscopy;  Laterality: N/A;  . Colonoscopy N/A 11/02/2012    Procedure: COLONOSCOPY;  Surgeon: Cleotis Nipper, MD;  Location: [facility] ENDOSCOPY;  Service: Endoscopy;  Laterality: N/A;  . Colonoscopy N/A 11/03/2012    Procedure: COLONOSCOPY;  Surgeon: Cleotis Nipper, MD;  Location: [facility] ENDOSCOPY;  Service: Endoscopy;  Laterality: N/A;  . Givens capsule study N/A 11/04/2012    Procedure: GIVENS CAPSULE STUDY;  Surgeon: Cleotis Nipper, MD;  Location: [facility] ENDOSCOPY;  Service: Endoscopy;  Laterality: N/A;  . Enteroscopy N/A 11/08/2012    Procedure: ENTEROSCOPY;  Surgeon: Wonda Horner, MD;  Location: [facility] ENDOSCOPY;  Service: Endoscopy;  Laterality: N/A;  . Amputation Left 05/12/2013    Procedure: AMPUTATION RAY;  Surgeon: Newt Minion, MD;  Location: [place];  Service: Orthopedics;  Laterality: Left;  Left Foot 1st Ray Amputation  . Eye surgery Left  to remove scar tissue  . Amputation Left 06/09/2013    Procedure: AMPUTATION BELOW KNEE;  Surgeon: Newt Minion, MD;  Location: [place];  Service: Orthopedics;  Laterality: Left;  Left Below Knee Amputation and removal proximal screws IM tibial nail  . Hardware removal Left 06/09/2013    Procedure: HARDWARE REMOVAL;  Surgeon: Newt Minion, MD;  Location: [place];  Service: Orthopedics;  Laterality: Left;  Left Below Knee Amputation  and Removal proximal screws IM tibial nail  . Amputation Right  09/08/2013    Procedure: AMPUTATION BELOW KNEE;  Surgeon: Newt Minion, MD;  Location: [place];  Service: Orthopedics;  Laterality: Right;  Right Below Knee Amputation  . Amputation Right 10/11/2013    Procedure: AMPUTATION BELOW KNEE;  Surgeon: Newt Minion, MD;  Location: [place];  Service: Orthopedics;  Laterality: Right;  Right Below Knee Amputation Revision    There were no vitals taken for this visit.  Visit Diagnosis:  Balance problems  Status post bilateral below knee amputation  Generalized weakness  Debility  Impaired functional mobility and activity tolerance  Impaired cognition      Subjective Assessment - 09/12/14 0856    Currently in Pain? No/denies   Multiple Pain Sites No     Treatment: Pt was instructed in red theraputty HEP for increased grip and pinch. Red theraband exercises for bilateral UE's 10-20 reps each, mod v.c. And demonstration intially, then pt returned demonstration. Pt's wife was present, she verbalized understanding.                    OT Education - 09/13/14 1258    Education provided Yes   Education Details HEP red putty, theraband   Person(s) Educated Patient;Spouse   Methods Explanation;Demonstration;Handout   Comprehension Verbalized understanding;Returned demonstration;Need further instruction;Verbal cues required          OT Short Term Goals - 09/03/14 1037    OT SHORT TERM GOAL #1   Title Pt and wife will be mod I with HEP - 10/01/2014   Status New   OT SHORT TERM GOAL #2   Title Pt will demonstrate increased grip strength bilaterally combined by at least 5 pounds (baseline = R= 38 pounds, L= 35 pounds)   Status New   OT SHORT TERM GOAL #3   Title Pt will demonstrate ability to  be min a with UB bathing at either bed or w/c level   Status New   OT SHORT TERM GOAL #4   Title Pt will demonstrate ability to dress UB with mod a at either bed or w/c level   Status New   OT SHORT TERM GOAL #5   Title Pt will be  supervision for sitting balance with one UE support for at least 5 minutes of a task.   Status New           OT Long Term Goals - 09/03/14 1040    OT LONG TERM GOAL #1   Title Pt and wife will be mod I with upgraded HEP - 10/29/2014   Status New   OT LONG TERM GOAL #2   Title Pt will demonstrate ability to be  be supervision with UB bathing w/c level   Status New   OT LONG TERM GOAL #3   Title Pt will demonstrate ability to be min a with LB bathing    Status New   OT LONG TERM GOAL #4   Title Pt will demonstrate ability to  dress UB with supervision at w/c level   Status New   OT LONG TERM GOAL #5   Title Pt will demonstrate ability to dress LB with mod a    Status New   OT LONG TERM GOAL #6   Title Pt will demonstrate ability to be min a with transfers to 3 in 1 with prosthesis   Status New   OT LONG TERM GOAL #7   Title Pt will tolerate 30 min of functional activity with no  more than one rest break   Status New               Plan - 09/12/14 0902    Clinical Impression Statement Pt with overall deconditioning, can benefit from skilled occupational therapy to address strength and endurance for increased independence with ADLs.   Rehab Potential Fair   OT Frequency 2x / week   OT Duration 8 weeks   OT Treatment/Interventions Self-care/ADL training;DME and/or AE instruction;Energy conservation;Therapeutic exercise;Therapist, nutritional;Therapeutic activities;Balance training;Patient/family education   Plan progress strengthening HEP.   OT Home Exercise Plan red putty , theraband   Consulted and Agree with Plan of Care Patient;Family member/caregiver        Problem List Patient Active Problem List   Diagnosis Date Noted  . Diarrhea 08/14/2014  . Dehydration 10/02/2013  . Fever 10/02/2013  . Altered mental status 10/01/2013  . Encephalopathy, toxic 10/01/2013  . Encephalopathy, metabolic 10/93/2355  . CVA (cerebral vascular accident) 09/28/2013  . FTT  (failure to thrive) in adult 09/28/2013  . Acute confusional state 09/28/2013  . Ulcer of sacral region, stage 3 09/26/2013  . S/P BKA (below knee amputation) bilateral 09/08/2013  . ESRD on hemodialysis 05/09/2013  . TIA (transient ischemic attack) 02/20/2013  . Acute blood loss anemia 10/28/2012  . Chronic combined systolic (EF 73%) and grade 2diastolic congestive heart failure 10/28/2012  . Obstipation 10/28/2012  . GI bleed 10/27/2012  . Mass in rectum 10/27/2012  . DM (diabetes mellitus) type I controlled with renal manifestation 09/08/2012  . LVH (left ventricular hypertrophy)-severe concentric 06/13/2012  . Anemia due to chronic illness 06/12/2012  . Hyperlipidemia 01/30/2011  . CHOLELITHIASIS 08/01/2010  . BENIGN PROSTATIC HYPERTROPHY 08/01/2010  . CEREBROVASCULAR ACCIDENT, HX OF 08/06/2009  . Depression 03/18/2009  . Acute combined systolic and diastolic heart failure, NYHA class 2-EF 45% 03/18/2009  . NEPHROLITHIASIS, HX OF 03/18/2009  . Morbid obesity 03/25/2007  . Essential hypertension 03/25/2007  . GERD 03/25/2007  . HEPATITIS C, HX OF 03/25/2007    RINE,KATHRYN 09/13/2014, 1:08 PM Theone Murdoch, OTR/L [phone] Phone: [phone] 1:08 PM 09/13/2014 [facility] [address] [place], [place], [address] Phone: [phone]   Fax:  [phone]

## 2014-09-14 ENCOUNTER — Ambulatory Visit: Payer: Medicare Other | Admitting: Occupational Therapy

## 2014-09-14 ENCOUNTER — Encounter: Payer: Self-pay | Admitting: Occupational Therapy

## 2014-09-14 ENCOUNTER — Ambulatory Visit: Payer: Medicare Other | Admitting: Physical Therapy

## 2014-09-14 ENCOUNTER — Inpatient Hospital Stay
Admit: 2014-09-14 | Discharge: 2014-09-14 | Disposition: A | Discharge status: Home / Self Care | Attending: Emergency Medicine

## 2014-09-14 ENCOUNTER — Emergency Department: Admit: 2014-09-14

## 2014-09-14 DIAGNOSIS — K4090 Unilateral inguinal hernia, without obstruction or gangrene, not specified as recurrent: Secondary | ICD-10-CM

## 2014-09-14 DIAGNOSIS — R5381 Other malaise: Secondary | ICD-10-CM | POA: Diagnosis not present

## 2014-09-14 DIAGNOSIS — R531 Weakness: Secondary | ICD-10-CM

## 2014-09-14 DIAGNOSIS — R4189 Other symptoms and signs involving cognitive functions and awareness: Secondary | ICD-10-CM | POA: Diagnosis not present

## 2014-09-14 DIAGNOSIS — Z89511 Acquired absence of right leg below knee: Secondary | ICD-10-CM | POA: Diagnosis not present

## 2014-09-14 DIAGNOSIS — Z7409 Other reduced mobility: Secondary | ICD-10-CM | POA: Diagnosis not present

## 2014-09-14 DIAGNOSIS — Z89512 Acquired absence of left leg below knee: Secondary | ICD-10-CM | POA: Diagnosis not present

## 2014-09-14 DIAGNOSIS — R2689 Other abnormalities of gait and mobility: Secondary | ICD-10-CM

## 2014-09-14 LAB — CBC WITH AUTO DIFFERENTIAL
Basophils %: 0 % (ref 0–2)
Basophils Absolute: 0.02 E9/L (ref 0.00–0.20)
Eosinophils %: 1 % (ref 0–6)
Eosinophils Absolute: 0.05 E9/L (ref 0.05–0.50)
Hematocrit: 46.4 % (ref 37.0–54.0)
Hemoglobin: 15.1 g/dL (ref 12.5–16.5)
Lymphocytes %: 27 % (ref 20–42)
Lymphocytes Absolute: 2.5 E9/L (ref 1.50–4.00)
MCH: 31.9 pg (ref 26.0–35.0)
MCHC: 32.7 % (ref 32.0–34.5)
MCV: 97.7 fL (ref 80.0–99.9)
MPV: 8.8 fL (ref 7.0–12.0)
Monocytes %: 6 % (ref 2–12)
Monocytes Absolute: 0.55 E9/L (ref 0.10–0.95)
Neutrophils %: 66 % (ref 43–80)
Neutrophils Absolute: 6.02 E9/L (ref 1.80–7.30)
Platelets: 195 E9/L (ref 130–450)
RBC: 4.75 E12/L (ref 3.80–5.80)
RDW: 14.2 fL (ref 11.5–15.0)
WBC: 9.1 E9/L (ref 4.5–11.5)

## 2014-09-14 LAB — LIPASE: Lipase: 20 U/L (ref 13–60)

## 2014-09-14 LAB — URINALYSIS
Blood, Urine: NEGATIVE
Glucose, Ur: NEGATIVE mg/dL
Ketones, Urine: NEGATIVE mg/dL
Leukocyte Esterase, Urine: NEGATIVE
Nitrite, Urine: NEGATIVE
Specific Gravity, UA: >=1.03 (ref 1.005–1.030)
Urobilinogen, Urine: 0.2 E.U./dL (ref <2.0)
pH, UA: 5 (ref 5.0–9.0)

## 2014-09-14 LAB — COMPREHENSIVE METABOLIC PANEL
ALT: 22 U/L (ref 0–40)
AST: 21 U/L (ref 0–39)
Albumin: 4.4 g/dL (ref 3.5–5.2)
Alkaline Phosphatase: 85 U/L (ref 40–129)
Anion Gap: 13 mmol/L (ref 7–16)
BUN: 9 mg/dL (ref 8–23)
CO2: 22 mmol/L (ref 22–29)
Calcium: 9.6 mg/dL (ref 8.6–10.2)
Chloride: 107 mmol/L (ref 98–107)
Creatinine: 0.9 mg/dL (ref 0.7–1.2)
GFR African American: >60
GFR Non-African American: >60 mL/min/{1.73_m2} (ref >=60)
Glucose: 124 mg/dL — ABNORMAL HIGH (ref 74–109)
Potassium: 4.2 mmol/L (ref 3.5–5.0)
Sodium: 142 mmol/L (ref 132–146)
Total Bilirubin: 0.8 mg/dL (ref 0.0–1.2)
Total Protein: 7.3 g/dL (ref 6.4–8.3)

## 2014-09-14 LAB — MICROSCOPIC URINALYSIS

## 2014-09-14 LAB — PROTIME-INR
INR: 3.2
Protime: 36.1 s — ABNORMAL HIGH (ref 9.3–12.4)

## 2014-09-14 LAB — LACTIC ACID: Lactic Acid: 1.5 mmol/L (ref 0.5–2.2)

## 2014-09-14 MED ORDER — MORPHINE SULFATE (PF) 4 MG/ML IV SOLN
4 MG/ML | Freq: Once | INTRAVENOUS | Status: AC
Start: 2014-09-14 — End: 2014-09-14
  Administered 2014-09-14: 20:00:00 4 mg via INTRAVENOUS

## 2014-09-14 MED ORDER — SODIUM CHLORIDE 0.9 % IV BOLUS
0.9 % | Freq: Once | INTRAVENOUS | Status: AC
Start: 2014-09-14 — End: 2014-09-14
  Administered 2014-09-14: 20:00:00 500 mL via INTRAVENOUS

## 2014-09-14 MED ORDER — HYDROCODONE-ACETAMINOPHEN 5-325 MG PO TABS
5-325 MG | ORAL_TABLET | Freq: Four times a day (QID) | ORAL | Status: AC | PRN
Start: 2014-09-14 — End: 2014-09-19

## 2014-09-14 MED ORDER — ONDANSETRON HCL 4 MG/2ML IJ SOLN
4 MG/2ML | Freq: Once | INTRAMUSCULAR | Status: AC
Start: 2014-09-14 — End: 2014-09-14
  Administered 2014-09-14: 20:00:00 4 mg via INTRAVENOUS

## 2014-09-14 MED ORDER — HYDRALAZINE HCL 25 MG PO TABS
25 MG | Freq: Once | ORAL | Status: DC
Start: 2014-09-14 — End: 2014-09-14

## 2014-09-14 MED FILL — HYDRALAZINE HCL 25 MG PO TABS: 25 MG | ORAL | Qty: 2

## 2014-09-14 MED FILL — MORPHINE SULFATE (PF) 4 MG/ML IV SOLN: 4 MG/ML | INTRAVENOUS | Qty: 1

## 2014-09-14 MED FILL — ONDANSETRON HCL 4 MG/2ML IJ SOLN: 4 MG/2ML | INTRAMUSCULAR | Qty: 2

## 2014-09-14 NOTE — ED Provider Notes (Signed)
FIRST PROVIDER CONTACT ASSESSMENT NOTE   ??  Department of Emergency Medicine   Admit Date: No admission date for patient encounter.    Chief Complaint: Inguinal Hernia      History of Present Illness:    Dylan Beasley is a 64 y.o. male who presents to the ED for hernia. Patient has a right hernia that has been worsening over the last year. It is currently entrapped. No nausea vomiting or constipation        -----------------END OF FIRST PROVIDER CONTACT ASSESSMENT NOTE--------------  Electronically signed by Avis EpleyFRANCHESSCA COLELLA, PA   DD: 09/14/14              Avis EpleyFranchessca Colella, PA  09/14/14 1125

## 2014-09-14 NOTE — ED Provider Notes (Signed)
ED Attending    ??  Department of Emergency Medicine   ED  Provider Note  Admit Date/RoomTime: 09/14/2014  1:41 PM  ED Room: 37/37  Chief Complaint     Inguinal Hernia    History of Present Illness   Source of history provided by:  patient.  History/Exam Limitations: none.       Dylan Beasley is a 64 y.o. old male who has a past medical history of:   Past Medical History   Diagnosis Date   ??? Hypertension    ??? Atrial fibrillation (HCC)    ??? HLD (hyperlipidemia)    ??? Inguinal hernia     presents to the emergency department by private vehicle and ambulatory, for complaints of still present aching pain right groin with intermittent radiation to RLQ and back which began 2 year(s) prior to arrival. Patient states he has been seen at the [facility] clinic for this and has been seen by a general surgeon as well. Patient reports that his physicians ultimately cancel his surgery due to his atrial fibrillation and being on Coumadin. Patient reports the emergency department today because he is tired of the pain which does not improve with the tramadol he is prescribed, and would like to have surgery to get this repaired. He denies fever, chills, chest pain, shortness of breath, abdominal pain, nausea vomiting or diarrhea, penile discharge, or dysuria at present. The pain is aggravated by ambulating and relieved by manually reducing the hernia. Patient reports when he eats, he gets intermittent cramping in his lower abdomen causing him to not want to eat. He reports over the last 2 years he has lost 20 pounds due to this.    ROS   Pertinent positives and negatives are stated within HPI, all other systems reviewed and are negative.    History reviewed. No pertinent past surgical history.Social History:  reports that he has been smoking Cigarettes.  He has a 2.5 pack-year smoking history. He does not have any smokeless tobacco history on file. He reports that he drinks alcohol. He reports that he does not use illicit drugs.  Family History:  family history is not on file.   Allergies: Review of patient's allergies indicates no known allergies.    Physical Exam         VS:  BP 165/100 mmHg   Pulse 60   Temp(Src) 97.6 ??F (36.4 ??C) (Temporal)   Resp 16   Ht 6' 3.5" (1.918 m)   Wt 180 lb (81.647 kg)   BMI 22.19 kg/m2   SpO2 98%   Oxygen Saturation Interpretation: Normal.    ?? General Appearance/Constitutional:  Alert, development consistent with age, NAD.  ?? HEENT:  NC/NT. PERRLA.  Airway patent.  ?? Neck:  Supple. No lymphadenopathy.  ?? Respiratory: Lungs Clear to auscultation and breath sounds equal. No wheezing, stridor or respiratory distress.  ?? CV:  Irregular rate and rhythm with controlled rate.  ?? GI:  General Appearance: normal.         Bowel sounds: normal bowel sounds.             Distension:  None.              Tenderness: No abdominal tenderness, no rebound tenderness, no guarding.           Liver: non-tender.                         Spleen:  non-tender.  Pulsatile Mass: absent.           Hernia:  right sided inguinal hernia, tender and partially reducible, without evidence of strangulation..  ?? Back: CVA Tenderness: No.  ?? Integument:  Normal turgor.  Warm, dry, without visible rash, unless noted elsewhere.  ?? Neurological:  Orientation age-appropriate.  Motor functions intact.    Lab / Imaging Results   (All laboratory and radiology results have been personally reviewed by myself)  Labs:  Results for orders placed or performed during the hospital encounter of 09/14/14   CBC Auto Differential   Result Value Ref Range    WBC 9.1 4.5 - 11.5 E9/L    RBC 4.75 3.80 - 5.80 E12/L    Hemoglobin 15.1 12.5 - 16.5 g/dL    Hematocrit 04.5 40.9 - 54.0 %    MCV 97.7 80.0 - 99.9 fL    MCH 31.9 26.0 - 35.0 pg    MCHC 32.7 32.0 - 34.5 %    RDW 14.2 11.5 - 15.0 fL    Platelets 195 130 - 450 E9/L    MPV 8.8 7.0 - 12.0 fL    Neutrophils Relative 66 43 - 80 %    Lymphocytes Relative 27 20 - 42 %    Monocytes Relative 6 2 - 12 %    Eosinophils Relative  Percent 1 0 - 6 %    Basophils Relative 0 0 - 2 %    Neutrophils Absolute 6.02 1.80 - 7.30 E9/L    Lymphocytes Absolute 2.50 1.50 - 4.00 E9/L    Monocytes Absolute 0.55 0.10 - 0.95 E9/L    Eosinophils Absolute 0.05 0.05 - 0.50 E9/L    Basophils Absolute 0.02 0.00 - 0.20 E9/L   Comprehensive Metabolic Panel   Result Value Ref Range    Sodium 142 132 - 146 mmol/L    Potassium 4.2 3.5 - 5.0 mmol/L    Chloride 107 98 - 107 mmol/L    CO2 22 22 - 29 mmol/L    Anion Gap 13 7 - 16 mmol/L    Glucose 124 (H) 74 - 109 mg/dL    BUN 9 8 - 23 mg/dL    CREATININE 0.9 0.7 - 1.2 mg/dL    GFR Non-African American >60 >=60 mL/min/1.73    GFR African American >60     Calcium 9.6 8.6 - 10.2 mg/dL    Total Protein 7.3 6.4 - 8.3 g/dL    Alb 4.4 3.5 - 5.2 g/dL    Total Bilirubin 0.8 0.0 - 1.2 mg/dL    Alkaline Phosphatase 85 40 - 129 U/L    ALT 22 0 - 40 U/L    AST 21 0 - 39 U/L   Lactic Acid, Plasma   Result Value Ref Range    Lactic Acid 1.5 0.5 - 2.2 mmol/L   Lipase   Result Value Ref Range    Lipase 20 13 - 60 U/L   Urinalysis   Result Value Ref Range    Color, UA Yellow Straw/Yellow    Clarity, UA Clear Clear    Glucose, Ur Negative Negative mg/dL    Bilirubin Urine SMALL (A) Negative    Ketones, Urine Negative Negative mg/dL    Specific Gravity, UA >=1.030 1.005 - 1.030    Blood, Urine Negative Negative    pH, UA 5.0 5.0 - 9.0    Protein, UA TRACE Negative mg/dL    Urobilinogen, Urine 0.2 < 2.0 E.U./dL    Nitrite, Urine Negative Negative  Leukocyte Esterase, Urine Negative Negative   Microscopic Urinalysis   Result Value Ref Range    WBC, UA 1-3 0 - 5 /HPF    RBC, UA NONE 0 - 2 /HPF    Bacteria, UA RARE (A) /HPF    Crystals Few    Protime-INR   Result Value Ref Range    Protime 36.1 (H) 9.3 - 12.4 sec    INR 3.2      Imaging:  All Radiology results interpreted by Radiologist unless otherwise noted.  CT ABDOMEN PELVIS WO IV CONTRAST   Final Result   IMPRESSION:   1. Right inguinal hernia new compared to prior study containing    omentum and small bowel. Proximal small bowel loops are fluid   filled and mildly prominent with single minimally dilated small   bowel loop within the midabdomen. No frank evidence for   obstruction.   2. Prostatomegaly with circumferential bladder wall thickening.   These are findings that can be seen with bladder outlet   obstruction.        EKG #1:  Interpreted by emergency department physician unless otherwise noted.  Time:  1434    Rate: 57  Rhythm: Atrial fibrillation with slow ventricular response.  Interpretation: no acute changes.  Comparison: stable as compared to patient's most recent EKG.      ED Course / Medical Decision Making     Medications   hydrALAZINE (APRESOLINE) tablet 50 mg (not administered)   morphine (PF) injection 4 mg (4 mg Intravenous Given 09/14/14 1431)   ondansetron (ZOFRAN) injection 4 mg (4 mg Intravenous Given 09/14/14 1431)   0.9 % sodium chloride bolus (500 mLs Intravenous New Bag 09/14/14 1431)     Re-Evaluations:  09/14/14      Time: 1500   Patient???s symptoms are improving.    Consultations:             IP CONSULT TO GENERAL SURGERY    Time: 1445 Discussed case with Dr. Nile DearGady who states he will take the consult and refer to medicine clinic for clearance as needed. To call the office for follow up appointment    Procedures:   none    MDM:  Labs and diagnostics as resulted above. No acute pathology warranting inpatient treatment at this time. Discussed case with general surgery and arranged follow-up with Dr. Nile DearGady who will refer to the medicine clinic as needed. Patient provided with a prescription for Norco to take as needed for pain. Discussed signs and symptoms to watch for and return to ED as needed. Patient provided with his dose of hydralazine which is due and discussed the importance of blood pressure medication compliance and follow up.    Counseling:   I have spoken with the patient and discussed today???s results, in addition to providing specific details for the plan of  care and counseling regarding the diagnosis and prognosis and are agreeable with the plan.     Assessment      1. Unilateral inguinal hernia without obstruction or gangrene, recurrence not specified    2. Lower abdominal pain    3. Essential hypertension      This patient's ED course included: a personal history and physicial examination, re-evaluation prior to disposition and IV medications  This patient has remained hemodynamically stable, improved and been closely monitored during their ED course.   Plan   Discharge to home.  Patient condition is good.    New Medications     New Prescriptions  HYDROCODONE-ACETAMINOPHEN (NORCO) 5-325 MG PER TABLET    Take 1 tablet by mouth every 6 hours as needed for Pain     Electronically signed by Donnalee Curry, NP   DD: 09/14/14  **This report was transcribed using voice recognition software. Every effort was made to ensure accuracy; however, inadvertent computerized transcription errors may be present.  END OF PROVIDER NOTE      Donnalee Curry, NP  09/14/14 1520

## 2014-09-14 NOTE — Therapy (Signed)
[facility] [address] [place], [place], [address] Phone: [phone]   Fax:  [phone]  Occupational Therapy Treatment  Patient Details  Name: Oscar Castillo MRN: [id number] Date of Birth: 1950/08/20 Referring Provider:  [name], MD  Encounter Date: 09/14/2014      OT End of Session - 09/14/14 1153    Visit Number 3   Number of Visits 16   Date for OT Re-Evaluation 10/29/14   Authorization Type medicare needs G code   OT Start Time 0932   OT Stop Time 1015   OT Time Calculation (min) 43 min   Activity Tolerance Patient tolerated treatment well      Past Medical History  Diagnosis Date  . ESRD on hemodialysis 05/05/2007    ESRD due to DM/HTN. Started dialysis in November 2013.  HD TTS at [facility] [place].  [name] BACK PAIN, LUMBAR, CHRONIC 08/06/2009  . BENIGN PROSTATIC HYPERTROPHY 08/01/2010  . CEREBROVASCULAR ACCIDENT, HX OF 08/06/2009  . CHOLELITHIASIS 08/01/2010  . CONGESTIVE HEART FAILURE 03/18/2009  . DEPRESSION 03/18/2009  . DIABETES MELLITUS, TYPE II 03/25/2007  . ERECTILE DYSFUNCTION 03/25/2007  . GERD 03/25/2007  . HEPATITIS C, HX OF 03/25/2007  . HYPERTENSION 03/25/2007  . Morbid obesity 03/25/2007  . NEPHROLITHIASIS, HX OF 03/18/2009  . Complication of anesthesia     wife states pt had trouble waking up with his last surgery in Nov., 2014    Past Surgical History  Procedure Laterality Date  . Nephrectomy      partial RR  . Av fistula placement  06/14/2012    Procedure: ARTERIOVENOUS (AV) FISTULA CREATION;  Surgeon: Angelia Mould, MD;  Location: [facility] OR;  Service: Vascular;  Laterality: Left;  Left basilic vein transposition with fistula.  . Tibia im nail insertion Left 09/09/2012    Procedure: INTRAMEDULLARY (IM) NAIL TIBIAL;  Surgeon: Johnny Bridge, MD;  Location: [place];  Service: Orthopedics;  Laterality: Left;  left tibial nail and open reduction internal fixation left fibula  fracture  . Orif fibula fracture Left 09/09/2012    Procedure: OPEN REDUCTION INTERNAL FIXATION (ORIF) FIBULA FRACTURE;  Surgeon: Johnny Bridge, MD;  Location: [place];  Service: Orthopedics;  Laterality: Left;  . Colonoscopy N/A 10/28/2012    Procedure: COLONOSCOPY;  Surgeon: Jeryl Columbia, MD;  Location: [facility] ENDOSCOPY;  Service: Endoscopy;  Laterality: N/A;  . Esophagogastroduodenoscopy N/A 11/02/2012    Procedure: ESOPHAGOGASTRODUODENOSCOPY (EGD);  Surgeon: Cleotis Nipper, MD;  Location: [facility] ENDOSCOPY;  Service: Endoscopy;  Laterality: N/A;  . Colonoscopy N/A 11/02/2012    Procedure: COLONOSCOPY;  Surgeon: Cleotis Nipper, MD;  Location: [facility] ENDOSCOPY;  Service: Endoscopy;  Laterality: N/A;  . Colonoscopy N/A 11/03/2012    Procedure: COLONOSCOPY;  Surgeon: Cleotis Nipper, MD;  Location: [facility] ENDOSCOPY;  Service: Endoscopy;  Laterality: N/A;  . Givens capsule study N/A 11/04/2012    Procedure: GIVENS CAPSULE STUDY;  Surgeon: Cleotis Nipper, MD;  Location: [facility] ENDOSCOPY;  Service: Endoscopy;  Laterality: N/A;  . Enteroscopy N/A 11/08/2012    Procedure: ENTEROSCOPY;  Surgeon: Wonda Horner, MD;  Location: [facility] ENDOSCOPY;  Service: Endoscopy;  Laterality: N/A;  . Amputation Left 05/12/2013    Procedure: AMPUTATION RAY;  Surgeon: Newt Minion, MD;  Location: [place];  Service: Orthopedics;  Laterality: Left;  Left Foot 1st Ray Amputation  . Eye surgery Left     to remove scar tissue  . Amputation Left 06/09/2013  Procedure: AMPUTATION BELOW KNEE;  Surgeon: Newt Minion, MD;  Location: [place];  Service: Orthopedics;  Laterality: Left;  Left Below Knee Amputation and removal proximal screws IM tibial nail  . Hardware removal Left 06/09/2013    Procedure: HARDWARE REMOVAL;  Surgeon: Newt Minion, MD;  Location: [place];  Service: Orthopedics;  Laterality: Left;  Left Below Knee Amputation  and Removal proximal screws IM tibial nail  . Amputation Right 09/08/2013    Procedure: AMPUTATION BELOW KNEE;  Surgeon:  Newt Minion, MD;  Location: [place];  Service: Orthopedics;  Laterality: Right;  Right Below Knee Amputation  . Amputation Right 10/11/2013    Procedure: AMPUTATION BELOW KNEE;  Surgeon: Newt Minion, MD;  Location: [place];  Service: Orthopedics;  Laterality: Right;  Right Below Knee Amputation Revision    There were no vitals taken for this visit.  Visit Diagnosis:  Generalized weakness  Balance problems  Impaired functional mobility and activity tolerance      Subjective Assessment - 09/14/14 0941    Currently in Pain? No/denies                 OT Treatments/Exercises (OP) - 09/14/14 0001    Exercises   Exercises Shoulder;Elbow   Shoulder Exercises: Seated   Other Seated Exercises Exercises to address UE strength including: shoulder flexion/ext, ab and adduction, bicep curls, chest presses in sitting all with 1 pound weight.  Pt able to do 12 reps x2 with rest breaks.  Pt needs max encouragement to complete.    Neurological Re-education Exercises   Other Exercises 1 Neuro re ed to address wheelchair to mat transfers with emphasis on LE positioning, leaning forward with active trunk. Neuro re ed to address dynamic sitting balance, forward lean and reach, and lateral weight shifting via activity. Pt very fearful of leaning forward making mobility very difficult.                OT Education - 09/13/14 1258    Education provided Yes   Education Details HEP red putty, theraband   Person(s) Educated Patient;Spouse   Methods Explanation;Demonstration;Handout   Comprehension Verbalized understanding;Returned demonstration;Need further instruction;Verbal cues required          OT Short Term Goals - 09/14/14 1156    OT SHORT TERM GOAL #1   Title Pt and wife will be mod I with HEP - 10/01/2014   Status New   OT SHORT TERM GOAL #2   Title Pt will demonstrate increased grip strength bilaterally combined by at least 5 pounds (baseline = R= 38 pounds, L= 35 pounds)    Status New   OT SHORT TERM GOAL #3   Title Pt will demonstrate ability to  be min a with UB bathing at either bed or w/c level   Status New   OT SHORT TERM GOAL #4   Title Pt will demonstrate ability to dress UB with mod a at either bed or w/c level   Status New   OT SHORT TERM GOAL #5   Title Pt will be supervision for sitting balance with one UE support for at least 5 minutes of a task.   Status New           OT Long Term Goals - 09/14/14 1156    OT LONG TERM GOAL #1   Title Pt and wife will be mod I with upgraded HEP - 10/29/2014   Status New   OT LONG TERM GOAL #2  Title Pt will demonstrate ability to be  be supervision with UB bathing w/c level   Status New   OT LONG TERM GOAL #3   Title Pt will demonstrate ability to be min a with LB bathing    Status New   OT LONG TERM GOAL #4   Title Pt will demonstrate ability to dress UB with supervision at w/c level   Status New   OT LONG TERM GOAL #5   Title Pt will demonstrate ability to dress LB with mod a    Status New   OT LONG TERM GOAL #6   Title Pt will demonstrate ability to be min a with transfers to 3 in 1 with prosthesis   Status New   OT LONG TERM GOAL #7   Title Pt will tolerate 30 min of functional activity with no  more than one rest break   Status New               Plan - 09/14/14 1153    Clinical Impression Statement Pt with slow progress toward goals. Pt reports he has not done theraband exercises at home because he is too wiped out on dialysis days and too wiped out on therapy days. Pt agrees to doing theraputty exercises at home and will work on UE strengthening in clinic until endurance improves. Pt needs max encouragement.   Rehab Potential Fair   Clinical Impairments Affecting Rehab Potential Pt can benefit from skilled OT to address the above deficits.   OT Frequency 2x / week   OT Duration 8 weeks   OT Treatment/Interventions Self-care/ADL training;DME and/or AE instruction;Energy  conservation;Therapeutic exercise;Therapist, nutritional;Therapeutic activities;Balance training;Patient/family education   Plan check HEP, UE strengthening, functional transfers, dynamic sitting balance.   Consulted and Agree with Plan of Care Patient;Family member/caregiver   Family Member Consulted wife        Problem List Patient Active Problem List   Diagnosis Date Noted  . Diarrhea 08/14/2014  . Dehydration 10/02/2013  . Fever 10/02/2013  . Altered mental status 10/01/2013  . Encephalopathy, toxic 10/01/2013  . Encephalopathy, metabolic 51/88/4166  . CVA (cerebral vascular accident) 09/28/2013  . FTT (failure to thrive) in adult 09/28/2013  . Acute confusional state 09/28/2013  . Ulcer of sacral region, stage 3 09/26/2013  . S/P BKA (below knee amputation) bilateral 09/08/2013  . ESRD on hemodialysis 05/09/2013  . TIA (transient ischemic attack) 02/20/2013  . Acute blood loss anemia 10/28/2012  . Chronic combined systolic (EF 06%) and grade 2diastolic congestive heart failure 10/28/2012  . Obstipation 10/28/2012  . GI bleed 10/27/2012  . Mass in rectum 10/27/2012  . DM (diabetes mellitus) type I controlled with renal manifestation 09/08/2012  . LVH (left ventricular hypertrophy)-severe concentric 06/13/2012  . Anemia due to chronic illness 06/12/2012  . Hyperlipidemia 01/30/2011  . CHOLELITHIASIS 08/01/2010  . BENIGN PROSTATIC HYPERTROPHY 08/01/2010  . CEREBROVASCULAR ACCIDENT, HX OF 08/06/2009  . Depression 03/18/2009  . Acute combined systolic and diastolic heart failure, NYHA class 2-EF 45% 03/18/2009  . NEPHROLITHIASIS, HX OF 03/18/2009  . Morbid obesity 03/25/2007  . Essential hypertension 03/25/2007  . GERD 03/25/2007  . HEPATITIS C, HX OF 03/25/2007    Quay Burow, OTR/L 09/14/2014, 11:57 AM  [facility] [address] [place], [place], [address] Phone: [phone]   Fax:   [phone]

## 2014-09-14 NOTE — Therapy (Signed)
[facility] [address] [place], [place], [address] Phone: [phone]   Fax:  [phone]  Physical Therapy Treatment  Patient Details  Name: Oscar Castillo MRN: [id number] Date of Birth: 1951/04/08 Referring Provider:  [name], MD  Encounter Date: 09/14/2014      PT End of Session - 09/14/14 1020    Visit Number 3   Number of Visits 34   Date for PT Re-Evaluation 11/02/14   PT Start Time 7893   PT Stop Time 1054   PT Time Calculation (min) 39 min   Equipment Utilized During Treatment Gait belt   Activity Tolerance Patient tolerated treatment well   Behavior During Therapy [facility] for tasks assessed/performed      Past Medical History  Diagnosis Date  . ESRD on hemodialysis 05/05/2007    ESRD due to DM/HTN. Started dialysis in November 2013.  HD TTS at [facility] on [place].  [name] BACK PAIN, LUMBAR, CHRONIC 08/06/2009  . BENIGN PROSTATIC HYPERTROPHY 08/01/2010  . CEREBROVASCULAR ACCIDENT, HX OF 08/06/2009  . CHOLELITHIASIS 08/01/2010  . CONGESTIVE HEART FAILURE 03/18/2009  . DEPRESSION 03/18/2009  . DIABETES MELLITUS, TYPE II 03/25/2007  . ERECTILE DYSFUNCTION 03/25/2007  . GERD 03/25/2007  . HEPATITIS C, HX OF 03/25/2007  . HYPERTENSION 03/25/2007  . Morbid obesity 03/25/2007  . NEPHROLITHIASIS, HX OF 03/18/2009  . Complication of anesthesia     wife states pt had trouble waking up with his last surgery in Nov., 2014    Past Surgical History  Procedure Laterality Date  . Nephrectomy      partial RR  . Av fistula placement  06/14/2012    Procedure: ARTERIOVENOUS (AV) FISTULA CREATION;  Surgeon: Angelia Mould, MD;  Location: [facility] OR;  Service: Vascular;  Laterality: Left;  Left basilic vein transposition with fistula.  . Tibia im nail insertion Left 09/09/2012    Procedure: INTRAMEDULLARY (IM) NAIL TIBIAL;  Surgeon: Johnny Bridge, MD;  Location: [place];  Service: Orthopedics;  Laterality: Left;  left tibial  nail and open reduction internal fixation left fibula fracture  . Orif fibula fracture Left 09/09/2012    Procedure: OPEN REDUCTION INTERNAL FIXATION (ORIF) FIBULA FRACTURE;  Surgeon: Johnny Bridge, MD;  Location: [place];  Service: Orthopedics;  Laterality: Left;  . Colonoscopy N/A 10/28/2012    Procedure: COLONOSCOPY;  Surgeon: Jeryl Columbia, MD;  Location: [facility] ENDOSCOPY;  Service: Endoscopy;  Laterality: N/A;  . Esophagogastroduodenoscopy N/A 11/02/2012    Procedure: ESOPHAGOGASTRODUODENOSCOPY (EGD);  Surgeon: Cleotis Nipper, MD;  Location: [facility] ENDOSCOPY;  Service: Endoscopy;  Laterality: N/A;  . Colonoscopy N/A 11/02/2012    Procedure: COLONOSCOPY;  Surgeon: Cleotis Nipper, MD;  Location: [facility] ENDOSCOPY;  Service: Endoscopy;  Laterality: N/A;  . Colonoscopy N/A 11/03/2012    Procedure: COLONOSCOPY;  Surgeon: Cleotis Nipper, MD;  Location: [facility] ENDOSCOPY;  Service: Endoscopy;  Laterality: N/A;  . Givens capsule study N/A 11/04/2012    Procedure: GIVENS CAPSULE STUDY;  Surgeon: Cleotis Nipper, MD;  Location: [facility] ENDOSCOPY;  Service: Endoscopy;  Laterality: N/A;  . Enteroscopy N/A 11/08/2012    Procedure: ENTEROSCOPY;  Surgeon: Wonda Horner, MD;  Location: [facility] ENDOSCOPY;  Service: Endoscopy;  Laterality: N/A;  . Amputation Left 05/12/2013    Procedure: AMPUTATION RAY;  Surgeon: Newt Minion, MD;  Location: [place];  Service: Orthopedics;  Laterality: Left;  Left Foot 1st Ray Amputation  . Eye surgery Left  to remove scar tissue  . Amputation Left 06/09/2013    Procedure: AMPUTATION BELOW KNEE;  Surgeon: Newt Minion, MD;  Location: [place];  Service: Orthopedics;  Laterality: Left;  Left Below Knee Amputation and removal proximal screws IM tibial nail  . Hardware removal Left 06/09/2013    Procedure: HARDWARE REMOVAL;  Surgeon: Newt Minion, MD;  Location: [place];  Service: Orthopedics;  Laterality: Left;  Left Below Knee Amputation  and Removal proximal screws IM tibial nail  . Amputation Right  09/08/2013    Procedure: AMPUTATION BELOW KNEE;  Surgeon: Newt Minion, MD;  Location: [place];  Service: Orthopedics;  Laterality: Right;  Right Below Knee Amputation  . Amputation Right 10/11/2013    Procedure: AMPUTATION BELOW KNEE;  Surgeon: Newt Minion, MD;  Location: [place];  Service: Orthopedics;  Laterality: Right;  Right Below Knee Amputation Revision    There were no vitals taken for this visit.  Visit Diagnosis:  Generalized weakness  Debility      Subjective Assessment - 09/14/14 1018    Symptoms Tired now. Arms hurt from OT session before this PT session. No falls.   Currently in Pain? Yes   Pain Score 8    Pain Location Arm   Pain Orientation Right;Left   Pain Descriptors / Indicators Sore   Pain Type Chronic pain   Pain Onset More than a month ago   Pain Frequency Intermittent   Aggravating Factors  increased movement/activity   Pain Relieving Factors rest     Treatment: Exercise Supine on mat - bridge x 10 with prostheses on  - lower trunk rotation x 12 each way with prostheses on - hip/knee press into mat 5 sec hold x 10 each side no prosthesis on  - supine hip abduct x 10 each side no prosthesis on   Seated on mat - with yellow band around legs at knee: hip abdct/ER x 10 reps. - bil hamstring curls with yellow band x 10 each leg - bil marching with no resistance x 10 reps each leg - with turned over chair behind pt with pillow on it: partial curl ups 2 sets of 5 reps, initially pt needed assistance with curl up, progressing to no assistance with second set of 5 reps - seated tall: upper trunk rotation with reaching back/behind with same side arm x 5 each way  Min assist to lie down into supine, mod assist to roll and sit back up to edge of mat (using log roll method). Cues on technique. Mod assist with lateral scooting from mat to wheelchair.         PT Short Term Goals - 09/03/14 1924    PT SHORT TERM GOAL #1   Title donnes prostheses with minimal  assist (Target Date: 10/02/14)   Time 1   Period Months   Status New   PT SHORT TERM GOAL #2   Title wife & patient verbalize adjusting wear schedule to maximize functional potential. (Target Date: 10/02/14)   Time 1   Period Months   Status New   PT SHORT TERM GOAL #3   Title performs squat-pivot transfer w/c to level mat with minimal assist. (Target Date: 10/02/14)   Time 1   Period Months   Status New   PT SHORT TERM GOAL #4   Title Sit to/from stand w/c to parallel bars with moderate assist (Target Date: 10/02/14)   Time 1   Period Months   Status New  PT SHORT TERM GOAL #5   Title patient & wife demonstrate initial HEP correctly. (Target Date: 10/02/14)   Time 1   Period Months   Status New           PT Long Term Goals - 09/03/14 1015    PT LONG TERM GOAL #1   Title Patient and wife demonstrate proper prosthetic care. (Target Date: 01/02/15)   Time 4   Period Months   Status New   PT LONG TERM GOAL #2   Title tolerates wear of prostheses >90% of awake hours except at dialysis (Target Date: 01/02/15)   Time 4   Period Months   Status New   PT LONG TERM GOAL #3   Title stand pivot transfers with rolling walker & prostheses modified independent.  (Target Date: 01/02/15)   Time 4   Period Months   Status New   PT LONG TERM GOAL #4   Title manages clothes, reaches 5" and in upper /lower cabinets in standing with UE support modified independent.  (Target Date: 01/02/15)   Time 4   Period Months   Status New   PT LONG TERM GOAL #5   Title ambulate 56' with rolling walker & prostheses with minimal assist.  (Target Date: 01/02/15)   Time 4   Period Months   Status New           Plan - 09/14/14 1020    Clinical Impression Statement Continued to work on leg strengthening and flexibility. Pt fatigues quickly and needs rest breaks. Spouse forgot to bring in pictures of beds, will bring them in next visit.   Pt will benefit from skilled therapeutic intervention in order to improve  on the following deficits Abnormal gait;Decreased activity tolerance;Decreased balance;Decreased endurance;Decreased knowledge of use of DME;Decreased mobility;Decreased range of motion;Decreased strength;Difficulty walking;Other (comment)  prosthetic dependency   Rehab Potential Good   Clinical Impairments Affecting Rehab Potential multiple medical issues over last year causing decondtioning   PT Frequency 2x / week   PT Duration Other (comment)  4 months   PT Treatment/Interventions ADLs/Self Care Home Management;Gait training;Stair training;DME Instruction;Functional mobility training;Therapeutic activities;Therapeutic exercise;Balance training;Neuromuscular re-education;Patient/family education;Manual techniques;Other (comment)  prosthetic training   PT Next Visit Plan bed mobility training, strengthening and flexibility exercises on mat both with and without bil prostheses   PT Home Exercise Plan hip & knee ROM & strength    Consulted and Agree with Plan of Care Patient;Family member/caregiver   Family Member Consulted wife      Problem List Patient Active Problem List   Diagnosis Date Noted  . Diarrhea 08/14/2014  . Dehydration 10/02/2013  . Fever 10/02/2013  . Altered mental status 10/01/2013  . Encephalopathy, toxic 10/01/2013  . Encephalopathy, metabolic 61/44/3154  . CVA (cerebral vascular accident) 09/28/2013  . FTT (failure to thrive) in adult 09/28/2013  . Acute confusional state 09/28/2013  . Ulcer of sacral region, stage 3 09/26/2013  . S/P BKA (below knee amputation) bilateral 09/08/2013  . ESRD on hemodialysis 05/09/2013  . TIA (transient ischemic attack) 02/20/2013  . Acute blood loss anemia 10/28/2012  . Chronic combined systolic (EF 00%) and grade 2diastolic congestive heart failure 10/28/2012  . Obstipation 10/28/2012  . GI bleed 10/27/2012  . Mass in rectum 10/27/2012  . DM (diabetes mellitus) type I controlled with renal manifestation 09/08/2012  . LVH  (left ventricular hypertrophy)-severe concentric 06/13/2012  . Anemia due to chronic illness 06/12/2012  . Hyperlipidemia 01/30/2011  . CHOLELITHIASIS 08/01/2010  .  BENIGN PROSTATIC HYPERTROPHY 08/01/2010  . CEREBROVASCULAR ACCIDENT, HX OF 08/06/2009  . Depression 03/18/2009  . Acute combined systolic and diastolic heart failure, NYHA class 2-EF 45% 03/18/2009  . NEPHROLITHIASIS, HX OF 03/18/2009  . Morbid obesity 03/25/2007  . Essential hypertension 03/25/2007  . GERD 03/25/2007  . HEPATITIS C, HX OF 03/25/2007    Willow Ora 09/14/2014, 7:09 PM  Willow Ora, PTA, [place] [address], [place], [place] [address] [phone] 09/14/2014, 7:09 PM

## 2014-09-15 DIAGNOSIS — D631 Anemia in chronic kidney disease: Secondary | ICD-10-CM | POA: Diagnosis not present

## 2014-09-15 DIAGNOSIS — E1122 Type 2 diabetes mellitus with diabetic chronic kidney disease: Secondary | ICD-10-CM | POA: Diagnosis not present

## 2014-09-15 DIAGNOSIS — N2581 Secondary hyperparathyroidism of renal origin: Secondary | ICD-10-CM | POA: Diagnosis not present

## 2014-09-15 DIAGNOSIS — N186 End stage renal disease: Secondary | ICD-10-CM | POA: Diagnosis not present

## 2014-09-16 LAB — EKG 12-LEAD
ECG Heart Rate: 57 /min
ECG QRS Axis: 58 deg
ECG QRS Duration: 108 ms
ECG QT Dispersion: 24 ms
ECG QTC Interval: 440 ms
ECG RR Interval: 1050 ms
Q-T Interval: 452 ms
T Axis: 63 deg

## 2014-09-17 ENCOUNTER — Encounter: Payer: Self-pay | Admitting: Occupational Therapy

## 2014-09-17 ENCOUNTER — Ambulatory Visit: Payer: Medicare Other | Admitting: Physical Therapy

## 2014-09-17 ENCOUNTER — Ambulatory Visit: Payer: Medicare Other | Admitting: Occupational Therapy

## 2014-09-17 DIAGNOSIS — R531 Weakness: Secondary | ICD-10-CM

## 2014-09-17 DIAGNOSIS — Z89511 Acquired absence of right leg below knee: Secondary | ICD-10-CM | POA: Diagnosis not present

## 2014-09-17 DIAGNOSIS — Z89512 Acquired absence of left leg below knee: Secondary | ICD-10-CM

## 2014-09-17 DIAGNOSIS — Z7409 Other reduced mobility: Secondary | ICD-10-CM

## 2014-09-17 DIAGNOSIS — R269 Unspecified abnormalities of gait and mobility: Secondary | ICD-10-CM

## 2014-09-17 DIAGNOSIS — R4189 Other symptoms and signs involving cognitive functions and awareness: Secondary | ICD-10-CM | POA: Diagnosis not present

## 2014-09-17 DIAGNOSIS — R5381 Other malaise: Secondary | ICD-10-CM | POA: Diagnosis not present

## 2014-09-17 DIAGNOSIS — R2689 Other abnormalities of gait and mobility: Secondary | ICD-10-CM

## 2014-09-17 NOTE — Therapy (Signed)
[facility] [address] [place], [place], [address] Phone: [phone]   Fax:  [phone]  Occupational Therapy Treatment  Patient Details  Name: Oscar Castillo MRN: [id number] Date of Birth: 04/28/1951 Referring Provider:  [name], MD  Encounter Date: 09/17/2014      OT End of Session - 09/17/14 1613    Visit Number 4   Number of Visits 16   Date for OT Re-Evaluation 10/29/14   Authorization Type medicare needs G code   OT Start Time 1245   OT Stop Time 1609   OT Time Calculation (min) 39 min   Activity Tolerance Patient tolerated treatment well      Past Medical History  Diagnosis Date  . ESRD on hemodialysis 05/05/2007    ESRD due to DM/HTN. Started dialysis in November 2013.  HD TTS at [facility] on [place].  [name] BACK PAIN, LUMBAR, CHRONIC 08/06/2009  . BENIGN PROSTATIC HYPERTROPHY 08/01/2010  . CEREBROVASCULAR ACCIDENT, HX OF 08/06/2009  . CHOLELITHIASIS 08/01/2010  . CONGESTIVE HEART FAILURE 03/18/2009  . DEPRESSION 03/18/2009  . DIABETES MELLITUS, TYPE II 03/25/2007  . ERECTILE DYSFUNCTION 03/25/2007  . GERD 03/25/2007  . HEPATITIS C, HX OF 03/25/2007  . HYPERTENSION 03/25/2007  . Morbid obesity 03/25/2007  . NEPHROLITHIASIS, HX OF 03/18/2009  . Complication of anesthesia     wife states pt had trouble waking up with his last surgery in Nov., 2014    Past Surgical History  Procedure Laterality Date  . Nephrectomy      partial RR  . Av fistula placement  06/14/2012    Procedure: ARTERIOVENOUS (AV) FISTULA CREATION;  Surgeon: Angelia Mould, MD;  Location: [facility] OR;  Service: Vascular;  Laterality: Left;  Left basilic vein transposition with fistula.  . Tibia im nail insertion Left 09/09/2012    Procedure: INTRAMEDULLARY (IM) NAIL TIBIAL;  Surgeon: Johnny Bridge, MD;  Location: [place];  Service: Orthopedics;  Laterality: Left;  left tibial nail and open reduction internal fixation left fibula  fracture  . Orif fibula fracture Left 09/09/2012    Procedure: OPEN REDUCTION INTERNAL FIXATION (ORIF) FIBULA FRACTURE;  Surgeon: Johnny Bridge, MD;  Location: [place];  Service: Orthopedics;  Laterality: Left;  . Colonoscopy N/A 10/28/2012    Procedure: COLONOSCOPY;  Surgeon: Jeryl Columbia, MD;  Location: [facility] ENDOSCOPY;  Service: Endoscopy;  Laterality: N/A;  . Esophagogastroduodenoscopy N/A 11/02/2012    Procedure: ESOPHAGOGASTRODUODENOSCOPY (EGD);  Surgeon: Cleotis Nipper, MD;  Location: [facility] ENDOSCOPY;  Service: Endoscopy;  Laterality: N/A;  . Colonoscopy N/A 11/02/2012    Procedure: COLONOSCOPY;  Surgeon: Cleotis Nipper, MD;  Location: [facility] ENDOSCOPY;  Service: Endoscopy;  Laterality: N/A;  . Colonoscopy N/A 11/03/2012    Procedure: COLONOSCOPY;  Surgeon: Cleotis Nipper, MD;  Location: [facility] ENDOSCOPY;  Service: Endoscopy;  Laterality: N/A;  . Givens capsule study N/A 11/04/2012    Procedure: GIVENS CAPSULE STUDY;  Surgeon: Cleotis Nipper, MD;  Location: [facility] ENDOSCOPY;  Service: Endoscopy;  Laterality: N/A;  . Enteroscopy N/A 11/08/2012    Procedure: ENTEROSCOPY;  Surgeon: Wonda Horner, MD;  Location: [facility] ENDOSCOPY;  Service: Endoscopy;  Laterality: N/A;  . Amputation Left 05/12/2013    Procedure: AMPUTATION RAY;  Surgeon: Newt Minion, MD;  Location: [place];  Service: Orthopedics;  Laterality: Left;  Left Foot 1st Ray Amputation  . Eye surgery Left     to remove scar tissue  . Amputation Left 06/09/2013  Procedure: AMPUTATION BELOW KNEE;  Surgeon: Newt Minion, MD;  Location: [place];  Service: Orthopedics;  Laterality: Left;  Left Below Knee Amputation and removal proximal screws IM tibial nail  . Hardware removal Left 06/09/2013    Procedure: HARDWARE REMOVAL;  Surgeon: Newt Minion, MD;  Location: [place];  Service: Orthopedics;  Laterality: Left;  Left Below Knee Amputation  and Removal proximal screws IM tibial nail  . Amputation Right 09/08/2013    Procedure: AMPUTATION BELOW KNEE;  Surgeon:  Newt Minion, MD;  Location: [place];  Service: Orthopedics;  Laterality: Right;  Right Below Knee Amputation  . Amputation Right 10/11/2013    Procedure: AMPUTATION BELOW KNEE;  Surgeon: Newt Minion, MD;  Location: [place];  Service: Orthopedics;  Laterality: Right;  Right Below Knee Amputation Revision    There were no vitals taken for this visit.  Visit Diagnosis:  Impaired functional mobility and activity tolerance  Generalized weakness      Subjective Assessment - 09/17/14 1536    Symptoms I stood in PT but it was hard   Pertinent History see epic snapshot   Currently in Pain? Yes   Pain Score 5    Pain Location Back  from working wtih PT earlier   Pain Descriptors / Indicators Aching   Pain Type Acute pain   Pain Onset Today   Pain Frequency Constant   Aggravating Factors  standing with PT   Pain Relieving Factors rest                 OT Treatments/Exercises (OP) - 09/17/14 0001    ADLs   Functional Mobility Pt required mod assist for wheelchair to mat transfers however after working on leaning forward, forward reach and scooting on edge of mat pt only required min guard to do multi squat pivot mat to wheelchair   Neurological Re-education Exercises   Other Exercises 1 Neuro re ed to address sitting balance, forward weight shift, forward reach, improving trunk control with UE out of weightbearing, reaching with 1 pound weight and improving activity tolerance..                  OT Short Term Goals - 09/17/14 1615    OT SHORT TERM GOAL #1   Title Pt and wife will be mod I with HEP - 10/01/2014   Status New   OT SHORT TERM GOAL #2   Title Pt will demonstrate increased grip strength bilaterally combined by at least 5 pounds (baseline = R= 38 pounds, L= 35 pounds)   Status New   OT SHORT TERM GOAL #3   Title Pt will demonstrate ability to  be min a with UB bathing at either bed or w/c level   Status New   OT SHORT TERM GOAL #4   Title Pt will  demonstrate ability to dress UB with mod a at either bed or w/c level   Status New   OT SHORT TERM GOAL #5   Title Pt will be supervision for sitting balance with one UE support for at least 5 minutes of a task.   Status New           OT Long Term Goals - 09/17/14 1615    OT LONG TERM GOAL #1   Title Pt and wife will be mod I with upgraded HEP - 10/29/2014   Status New   OT LONG TERM GOAL #2   Title Pt will demonstrate ability to be  be supervision with UB bathing w/c level   Status New   OT LONG TERM GOAL #3   Title Pt will demonstrate ability to be min a with LB bathing    Status New   OT LONG TERM GOAL #4   Title Pt will demonstrate ability to dress UB with supervision at w/c level   Status New   OT LONG TERM GOAL #5   Title Pt will demonstrate ability to dress LB with mod a    Status New   OT LONG TERM GOAL #6   Title Pt will demonstrate ability to be min a with transfers to 3 in 1 with prosthesis   Status New   OT LONG TERM GOAL #7   Title Pt will tolerate 30 min of functional activity with no  more than one rest break   Status New               Plan - 09/17/14 1613    Clinical Impression Statement Pt making excellent progress toward all goals. Pt with improved sitting balance, activity tolerance, ability to lean forward, and transfers. Wife reports pt is much more paticipatory at  home.   Rehab Potential Fair   Clinical Impairments Affecting Rehab Potential Pt can benefit from skilled OT to address the above deficits.   OT Frequency 2x / week   OT Duration 8 weeks   OT Treatment/Interventions Self-care/ADL training;DME and/or AE instruction;Energy conservation;Therapeutic exercise;Therapist, nutritional;Therapeutic activities;Balance training;Patient/family education   Plan UE strengthening, transfers, trunk control, foward reach, sitting balance. sit to squat   Consulted and Agree with Plan of Care Patient;Family member/caregiver   Family Member  Consulted wife        Problem List Patient Active Problem List   Diagnosis Date Noted  . Diarrhea 08/14/2014  . Dehydration 10/02/2013  . Fever 10/02/2013  . Altered mental status 10/01/2013  . Encephalopathy, toxic 10/01/2013  . Encephalopathy, metabolic 97/08/6376  . CVA (cerebral vascular accident) 09/28/2013  . FTT (failure to thrive) in adult 09/28/2013  . Acute confusional state 09/28/2013  . Ulcer of sacral region, stage 3 09/26/2013  . S/P BKA (below knee amputation) bilateral 09/08/2013  . ESRD on hemodialysis 05/09/2013  . TIA (transient ischemic attack) 02/20/2013  . Acute blood loss anemia 10/28/2012  . Chronic combined systolic (EF 58%) and grade 2diastolic congestive heart failure 10/28/2012  . Obstipation 10/28/2012  . GI bleed 10/27/2012  . Mass in rectum 10/27/2012  . DM (diabetes mellitus) type I controlled with renal manifestation 09/08/2012  . LVH (left ventricular hypertrophy)-severe concentric 06/13/2012  . Anemia due to chronic illness 06/12/2012  . Hyperlipidemia 01/30/2011  . CHOLELITHIASIS 08/01/2010  . BENIGN PROSTATIC HYPERTROPHY 08/01/2010  . CEREBROVASCULAR ACCIDENT, HX OF 08/06/2009  . Depression 03/18/2009  . Acute combined systolic and diastolic heart failure, NYHA class 2-EF 45% 03/18/2009  . NEPHROLITHIASIS, HX OF 03/18/2009  . Morbid obesity 03/25/2007  . Essential hypertension 03/25/2007  . GERD 03/25/2007  . HEPATITIS C, HX OF 03/25/2007    Quay Burow, OTR/L 09/17/2014, 4:16 PM  [facility] [address] [place], [place], [address] Phone: [phone]   Fax:  [phone]

## 2014-09-17 NOTE — Patient Instructions (Signed)
Sitting Balance Copyright  VHI. All rights reserved.  Forward Lean (Sitting)   Position wheelchair in front of table or counter. Sit away from back of wheelchair with feet on floor. Cross arms across your chest.  With straight back, tighten stomach and lean forward at hips. Repeat __10-15__ times. Do __1-2__  per day.  http://orth.exer.us/1142   Copyright  VHI. All rights reserved.  Sitting Balance (Very Advanced)   With feet flat sitting away from back of wheelchair with arms crossed across body, turn upper body as far as possible toward one side and then toward other side. Work to keep balance. Repeat __10-15__ times. Do __1-2__ sessions per day.  http://gt2.exer.us/699   Copyright  VHI. All rights reserved.  Backward Lean (Kneeling)   Sitting in wheelchair away from back with arms crossed across chest. NOT IN ABOVE POSITION Slowly lean back, keeping stomach tight, trunk rigid. Then sit up straight without using your arms. Repeat _10-15___ times. Do __1-2__ sessions per day.  http://orth.exer.us/1124   Copyright  VHI. All rights reserved.  Sitting Leg Press: Unilateral (Manual Resistance)   Sit with foot out in front of you against wall, Press out with your leg straightening your knee as much as possible.  Complete  _10-15__ repetitions on each leg. Perform _1-2__ sessions per day.  Copyright  VHI. All rights reserved.    Propel wheelchair forward with legs by digging heels into floor and pulling with legs. Propel wheelchair backward pushing off of your legs.     BED MOBILITY: Side-Lying to Sit   Lie on side, move legs to edge of bed. Make sure your top shoulder is in front of your bottom shoulder. Push down with both hands while moving legs off bed to reach sitting position. Copyright  VHI. All rights reserved.  BED MOBILITY: Rolling   Bend knees and hips; reach arm across chest. Roll to side by bending top knee /hip and reaching across  body.  Copyright  VHI. All rights reserved.  Sit to Supine (Active)   From sitting position, reach across body with opposite hand to direction you are going. (If lying on right side, reach with left arm).  Keep top shoulder in front of bottom shoulder. lie on side, top leg bent. Then Roll slowly to back..  Copyright  VHI. All rights reserved.

## 2014-09-17 NOTE — Therapy (Signed)
[facility] [address] [place], [place], [address] Phone: [phone]   Fax:  [phone]  Physical Therapy Treatment  Patient Details  Name: Oscar Castillo MRN: [id number] Date of Birth: 1950/11/24 Referring Provider:  [name], MD  Encounter Date: 09/17/2014      PT End of Session - 09/17/14 1445    Visit Number 4   Number of Visits 34   Date for PT Re-Evaluation 11/02/14   PT Start Time 1400   PT Stop Time 1445   PT Time Calculation (min) 45 min   Equipment Utilized During Treatment Gait belt   Activity Tolerance Patient tolerated treatment well   Behavior During Therapy [facility] for tasks assessed/performed      Past Medical History  Diagnosis Date  . ESRD on hemodialysis 05/05/2007    ESRD due to DM/HTN. Started dialysis in November 2013.  HD TTS at [facility] on [address].  [name] BACK PAIN, LUMBAR, CHRONIC 08/06/2009  . BENIGN PROSTATIC HYPERTROPHY 08/01/2010  . CEREBROVASCULAR ACCIDENT, HX OF 08/06/2009  . CHOLELITHIASIS 08/01/2010  . CONGESTIVE HEART FAILURE 03/18/2009  . DEPRESSION 03/18/2009  . DIABETES MELLITUS, TYPE II 03/25/2007  . ERECTILE DYSFUNCTION 03/25/2007  . GERD 03/25/2007  . HEPATITIS C, HX OF 03/25/2007  . HYPERTENSION 03/25/2007  . Morbid obesity 03/25/2007  . NEPHROLITHIASIS, HX OF 03/18/2009  . Complication of anesthesia     wife states pt had trouble waking up with his last surgery in Nov., 2014    Past Surgical History  Procedure Laterality Date  . Nephrectomy      partial RR  . Av fistula placement  06/14/2012    Procedure: ARTERIOVENOUS (AV) FISTULA CREATION;  Surgeon: Angelia Mould, MD;  Location: [facility] OR;  Service: Vascular;  Laterality: Left;  Left basilic vein transposition with fistula.  . Tibia im nail insertion Left 09/09/2012    Procedure: INTRAMEDULLARY (IM) NAIL TIBIAL;  Surgeon: Johnny Bridge, MD;  Location: [place];  Service: Orthopedics;  Laterality: Left;  left tibial  nail and open reduction internal fixation left fibula fracture  . Orif fibula fracture Left 09/09/2012    Procedure: OPEN REDUCTION INTERNAL FIXATION (ORIF) FIBULA FRACTURE;  Surgeon: Johnny Bridge, MD;  Location: [place];  Service: Orthopedics;  Laterality: Left;  . Colonoscopy N/A 10/28/2012    Procedure: COLONOSCOPY;  Surgeon: Jeryl Columbia, MD;  Location: [facility] ENDOSCOPY;  Service: Endoscopy;  Laterality: N/A;  . Esophagogastroduodenoscopy N/A 11/02/2012    Procedure: ESOPHAGOGASTRODUODENOSCOPY (EGD);  Surgeon: Cleotis Nipper, MD;  Location: [facility] ENDOSCOPY;  Service: Endoscopy;  Laterality: N/A;  . Colonoscopy N/A 11/02/2012    Procedure: COLONOSCOPY;  Surgeon: Cleotis Nipper, MD;  Location: [facility] ENDOSCOPY;  Service: Endoscopy;  Laterality: N/A;  . Colonoscopy N/A 11/03/2012    Procedure: COLONOSCOPY;  Surgeon: Cleotis Nipper, MD;  Location: [facility] ENDOSCOPY;  Service: Endoscopy;  Laterality: N/A;  . Givens capsule study N/A 11/04/2012    Procedure: GIVENS CAPSULE STUDY;  Surgeon: Cleotis Nipper, MD;  Location: [facility] ENDOSCOPY;  Service: Endoscopy;  Laterality: N/A;  . Enteroscopy N/A 11/08/2012    Procedure: ENTEROSCOPY;  Surgeon: Wonda Horner, MD;  Location: [facility] ENDOSCOPY;  Service: Endoscopy;  Laterality: N/A;  . Amputation Left 05/12/2013    Procedure: AMPUTATION RAY;  Surgeon: Newt Minion, MD;  Location: [place];  Service: Orthopedics;  Laterality: Left;  Left Foot 1st Ray Amputation  . Eye surgery Left  to remove scar tissue  . Amputation Left 06/09/2013    Procedure: AMPUTATION BELOW KNEE;  Surgeon: Newt Minion, MD;  Location: [place];  Service: Orthopedics;  Laterality: Left;  Left Below Knee Amputation and removal proximal screws IM tibial nail  . Hardware removal Left 06/09/2013    Procedure: HARDWARE REMOVAL;  Surgeon: Newt Minion, MD;  Location: [facility];  Service: Orthopedics;  Laterality: Left;  Left Below Knee Amputation  and Removal proximal screws IM tibial nail  . Amputation Right  09/08/2013    Procedure: AMPUTATION BELOW KNEE;  Surgeon: Newt Minion, MD;  Location: [place];  Service: Orthopedics;  Laterality: Right;  Right Below Knee Amputation  . Amputation Right 10/11/2013    Procedure: AMPUTATION BELOW KNEE;  Surgeon: Newt Minion, MD;  Location: [place];  Service: Orthopedics;  Laterality: Right;  Right Below Knee Amputation Revision    There were no vitals taken for this visit.  Visit Diagnosis:  Generalized weakness  Balance problems  Impaired functional mobility and activity tolerance  Status post bilateral below knee amputation  Abnormality of gait      Subjective Assessment - 09/17/14 1503    Symptoms Moved into bedroom in main house and sleeping better. Wearing prostheses 2-3 hours per day on non-dialysis days.   Currently in Pain? No/denies     Prosthetic Training: No skin issues. Patient donned prosthesis with minimal assist and constant verbal cues. PT instructed patient & wife in increasing wear to awake hours to increase function and safety /reduce fall risk.  Therapeutic Activities: Patient performed sliding board transfer w/c to/from mat of even height with minimal assist with tactile & verbal cues on wt shift. PT instructed in bed mobility of rolling and supine <>sit with minimal assist to minimal guard /tactile cues. PT instructed patient & wife in set-up of w/c at head of bed to leave in place to act as rail to aide.  Therapeutic Exercise: Seated trunk without UE support or back support at edge of w/c with prostheses feet on floor: forward lean & recovery, back lean & recovery and trunk rotation, Closed chain knee extension with foot against wall for right & left LE 10 reps each; propelling w/c forward and backward with LEs with minimal assist.  In parallel bars (patient pulling up on bars) with knees totally blocked by strapping system: sit to stand with maximal assist. Maintained upright 15 seconds with moderate assist 2  reps.                       PT Education - 09/17/14 1445    Education provided Yes   Education Details Seated exercises and bed mobility   Person(s) Educated Patient;Spouse   Methods Explanation;Demonstration;Handout;Tactile cues   Comprehension Verbalized understanding;Returned demonstration;Tactile cues required;Need further instruction          PT Short Term Goals - 09/03/14 1924    PT SHORT TERM GOAL #1   Title donnes prostheses with minimal assist (Target Date: 10/02/14)   Time 1   Period Months   Status New   PT SHORT TERM GOAL #2   Title wife & patient verbalize adjusting wear schedule to maximize functional potential. (Target Date: 10/02/14)   Time 1   Period Months   Status New   PT SHORT TERM GOAL #3   Title performs squat-pivot transfer w/c to level mat with minimal assist. (Target Date: 10/02/14)   Time 1   Period Months  Status New   PT SHORT TERM GOAL #4   Title Sit to/from stand w/c to parallel bars with moderate assist (Target Date: 10/02/14)   Time 1   Period Months   Status New   PT SHORT TERM GOAL #5   Title patient & wife demonstrate initial HEP correctly. (Target Date: 10/02/14)   Time 1   Period Months   Status New           PT Long Term Goals - 09/03/14 1015    PT LONG TERM GOAL #1   Title Patient and wife demonstrate proper prosthetic care. (Target Date: 01/02/15)   Time 4   Period Months   Status New   PT LONG TERM GOAL #2   Title tolerates wear of prostheses >90% of awake hours except at dialysis (Target Date: 01/02/15)   Time 4   Period Months   Status New   PT LONG TERM GOAL #3   Title stand pivot transfers with rolling walker & prostheses modified independent.  (Target Date: 01/02/15)   Time 4   Period Months   Status New   PT LONG TERM GOAL #4   Title manages clothes, reaches 5" and in upper /lower cabinets in standing with UE support modified independent.  (Target Date: 01/02/15)   Time 4   Period Months   Status New    PT LONG TERM GOAL #5   Title ambulate 76' with rolling walker & prostheses with minimal assist.  (Target Date: 01/02/15)   Time 4   Period Months   Status New               Plan - 09/17/14 1445    Clinical Impression Statement Patient required less assistance for sliding board transfer and bed mobility. His wife & he seem to understand seated & supine HEPs.   Pt will benefit from skilled therapeutic intervention in order to improve on the following deficits Abnormal gait;Decreased activity tolerance;Decreased balance;Decreased endurance;Decreased knowledge of use of DME;Decreased mobility;Decreased range of motion;Decreased strength;Difficulty walking;Other (comment)  prosthetic dependency   Rehab Potential Good   Clinical Impairments Affecting Rehab Potential multiple medical issues over last year causing decondtioning   PT Frequency 2x / week   PT Duration Other (comment)  4 months   PT Treatment/Interventions ADLs/Self Care Home Management;Gait training;Stair training;DME Instruction;Functional mobility training;Therapeutic activities;Therapeutic exercise;Balance training;Neuromuscular re-education;Patient/family education;Manual techniques;Other (comment)  prosthetic training   PT Next Visit Plan bed mobility training, strengthening and flexibility exercises on mat both with and without bil prostheses   PT Home Exercise Plan review seated HEP   Consulted and Agree with Plan of Care Patient;Family member/caregiver   Family Member Consulted wife        Problem List Patient Active Problem List   Diagnosis Date Noted  . Diarrhea 08/14/2014  . Dehydration 10/02/2013  . Fever 10/02/2013  . Altered mental status 10/01/2013  . Encephalopathy, toxic 10/01/2013  . Encephalopathy, metabolic 40/04/2724  . CVA (cerebral vascular accident) 09/28/2013  . FTT (failure to thrive) in adult 09/28/2013  . Acute confusional state 09/28/2013  . Ulcer of sacral region, stage 3 09/26/2013   . S/P BKA (below knee amputation) bilateral 09/08/2013  . ESRD on hemodialysis 05/09/2013  . TIA (transient ischemic attack) 02/20/2013  . Acute blood loss anemia 10/28/2012  . Chronic combined systolic (EF 36%) and grade 2diastolic congestive heart failure 10/28/2012  . Obstipation 10/28/2012  . GI bleed 10/27/2012  . Mass in rectum 10/27/2012  . DM (diabetes  mellitus) type I controlled with renal manifestation 09/08/2012  . LVH (left ventricular hypertrophy)-severe concentric 06/13/2012  . Anemia due to chronic illness 06/12/2012  . Hyperlipidemia 01/30/2011  . CHOLELITHIASIS 08/01/2010  . BENIGN PROSTATIC HYPERTROPHY 08/01/2010  . CEREBROVASCULAR ACCIDENT, HX OF 08/06/2009  . Depression 03/18/2009  . Acute combined systolic and diastolic heart failure, NYHA class 2-EF 45% 03/18/2009  . NEPHROLITHIASIS, HX OF 03/18/2009  . Morbid obesity 03/25/2007  . Essential hypertension 03/25/2007  . GERD 03/25/2007  . HEPATITIS C, HX OF 03/25/2007    Jamey Reas PT, DPT Physical Therapist Specializing in Prosthetics 09/17/2014, 9:02 PM  [facility] [address] [place], [place], [address] Phone: [phone]   Fax:  [phone]

## 2014-09-18 DIAGNOSIS — E1122 Type 2 diabetes mellitus with diabetic chronic kidney disease: Secondary | ICD-10-CM | POA: Diagnosis not present

## 2014-09-18 DIAGNOSIS — D631 Anemia in chronic kidney disease: Secondary | ICD-10-CM | POA: Diagnosis not present

## 2014-09-18 DIAGNOSIS — N2581 Secondary hyperparathyroidism of renal origin: Secondary | ICD-10-CM | POA: Diagnosis not present

## 2014-09-18 DIAGNOSIS — N186 End stage renal disease: Secondary | ICD-10-CM | POA: Diagnosis not present

## 2014-09-21 ENCOUNTER — Encounter: Payer: Self-pay | Admitting: Occupational Therapy

## 2014-09-21 ENCOUNTER — Ambulatory Visit: Payer: Medicare Other | Admitting: Occupational Therapy

## 2014-09-21 ENCOUNTER — Encounter: Payer: Self-pay | Admitting: Physical Therapy

## 2014-09-21 ENCOUNTER — Ambulatory Visit: Payer: Medicare Other | Admitting: Physical Therapy

## 2014-09-21 VITALS — BP 152/86 | HR 82

## 2014-09-21 DIAGNOSIS — Z7409 Other reduced mobility: Secondary | ICD-10-CM

## 2014-09-21 DIAGNOSIS — R5381 Other malaise: Secondary | ICD-10-CM | POA: Diagnosis not present

## 2014-09-21 DIAGNOSIS — Z89511 Acquired absence of right leg below knee: Secondary | ICD-10-CM

## 2014-09-21 DIAGNOSIS — Z89512 Acquired absence of left leg below knee: Secondary | ICD-10-CM | POA: Diagnosis not present

## 2014-09-21 DIAGNOSIS — R4189 Other symptoms and signs involving cognitive functions and awareness: Secondary | ICD-10-CM | POA: Diagnosis not present

## 2014-09-21 DIAGNOSIS — R531 Weakness: Secondary | ICD-10-CM

## 2014-09-21 DIAGNOSIS — R2689 Other abnormalities of gait and mobility: Secondary | ICD-10-CM

## 2014-09-21 NOTE — Therapy (Signed)
[facility] [address] [place], [place], [address] Phone: [phone]   Fax:  [phone]  Occupational Therapy Treatment  Patient Details  Name: Oscar Castillo MRN: [id number] Date of Birth: 08-06-1950 Referring Provider:  [name], MD  Encounter Date: 09/21/2014      OT End of Session - 09/21/14 1633    Activity Tolerance --  session ended early due to fatigue, fluctuating BP, wife able to monitor BP at home, and instructed to call MD if issue persists      Past Medical History  Diagnosis Date  . ESRD on hemodialysis 05/05/2007    ESRD due to DM/HTN. Started dialysis in November 2013.  HD TTS at [facility] on [address].  [name] BACK PAIN, LUMBAR, CHRONIC 08/06/2009  . BENIGN PROSTATIC HYPERTROPHY 08/01/2010  . CEREBROVASCULAR ACCIDENT, HX OF 08/06/2009  . CHOLELITHIASIS 08/01/2010  . CONGESTIVE HEART FAILURE 03/18/2009  . DEPRESSION 03/18/2009  . DIABETES MELLITUS, TYPE II 03/25/2007  . ERECTILE DYSFUNCTION 03/25/2007  . GERD 03/25/2007  . HEPATITIS C, HX OF 03/25/2007  . HYPERTENSION 03/25/2007  . Morbid obesity 03/25/2007  . NEPHROLITHIASIS, HX OF 03/18/2009  . Complication of anesthesia     wife states pt had trouble waking up with his last surgery in Nov., 2014    Past Surgical History  Procedure Laterality Date  . Nephrectomy      partial RR  . Av fistula placement  06/14/2012    Procedure: ARTERIOVENOUS (AV) FISTULA CREATION;  Surgeon: Angelia Mould, MD;  [facility] OR;  Service: Vascular;  Laterality: Left;  Left basilic vein transposition with fistula.  . Tibia im nail insertion Left 09/09/2012    Procedure: INTRAMEDULLARY (IM) NAIL TIBIAL;  Surgeon: Johnny Bridge, MD;  Location: [place];  Service: Orthopedics;  Laterality: Left;  left tibial nail and open reduction internal fixation left fibula fracture  . Orif fibula fracture Left 09/09/2012    Procedure: OPEN REDUCTION INTERNAL FIXATION (ORIF)  FIBULA FRACTURE;  Surgeon: Johnny Bridge, MD;  Location: [place];  Service: Orthopedics;  Laterality: Left;  . Colonoscopy N/A 10/28/2012    Procedure: COLONOSCOPY;  Surgeon: Jeryl Columbia, MD;  Location: [facility] ENDOSCOPY;  Service: Endoscopy;  Laterality: N/A;  . Esophagogastroduodenoscopy N/A 11/02/2012    Procedure: ESOPHAGOGASTRODUODENOSCOPY (EGD);  Surgeon: Cleotis Nipper, MD;  Location: [facility] ENDOSCOPY;  Service: Endoscopy;  Laterality: N/A;  . Colonoscopy N/A 11/02/2012    Procedure: COLONOSCOPY;  Surgeon: Cleotis Nipper, MD;  Location: [facility] ENDOSCOPY;  Service: Endoscopy;  Laterality: N/A;  . Colonoscopy N/A 11/03/2012    Procedure: COLONOSCOPY;  Surgeon: Cleotis Nipper, MD;  Location: [facility] ENDOSCOPY;  Service: Endoscopy;  Laterality: N/A;  . Givens capsule study N/A 11/04/2012    Procedure: GIVENS CAPSULE STUDY;  Surgeon: Cleotis Nipper, MD;  Location: [facility] ENDOSCOPY;  Service: Endoscopy;  Laterality: N/A;  . Enteroscopy N/A 11/08/2012    Procedure: ENTEROSCOPY;  Surgeon: Wonda Horner, MD;  Location: [facility] ENDOSCOPY;  Service: Endoscopy;  Laterality: N/A;  . Amputation Left 05/12/2013    Procedure: AMPUTATION RAY;  Surgeon: Newt Minion, MD;  Location: [place];  Service: Orthopedics;  Laterality: Left;  Left Foot 1st Ray Amputation  . Eye surgery Left     to remove scar tissue  . Amputation Left 06/09/2013    Procedure: AMPUTATION BELOW KNEE;  Surgeon: Newt Minion, MD;  Location: [place];  Service: Orthopedics;  Laterality: Left;  Left  Below Knee Amputation and removal proximal screws IM tibial nail  . Hardware removal Left 06/09/2013    Procedure: HARDWARE REMOVAL;  Surgeon: Newt Minion, MD;  Location: [place];  Service: Orthopedics;  Laterality: Left;  Left Below Knee Amputation  and Removal proximal screws IM tibial nail  . Amputation Right 09/08/2013    Procedure: AMPUTATION BELOW KNEE;  Surgeon: Newt Minion, MD;  Location: [place];  Service: Orthopedics;  Laterality: Right;  Right Below Knee  Amputation  . Amputation Right 10/11/2013    Procedure: AMPUTATION BELOW KNEE;  Surgeon: Newt Minion, MD;  Location: [place];  Service: Orthopedics;  Laterality: Right;  Right Below Knee Amputation Revision    BP 152/86 mmHg  Pulse 82  Visit Diagnosis:  Generalized weakness  Impaired functional mobility and activity tolerance      Subjective Assessment - 09/21/14 1528    Symptoms Wife indicates patient is performing upper body care now, adn she is completing lower body   Pertinent History see epic snapshot   Currently in Pain? No/denies                 OT Treatments/Exercises (OP) - 09/21/14 1529    Transfers   Comments squat pivot transfer level surface with min assiist   Neurological Re-education Exercises   Other Exercises 1 Neuromuscular reeducation to address postural control and stability.  Weight shifting in increasing degrees is all directions, with and without minimal resistance                OT Education - 09/21/14 1530    Education provided Yes   Education Details Importance of dialysis    Person(s) Educated Patient   Methods Explanation   Comprehension Verbal cues required          OT Short Term Goals - 09/21/14 1631    OT SHORT TERM GOAL #1   Title Pt and wife will be mod I with HEP - 10/01/2014   Status On-going   OT SHORT TERM GOAL #2   Title Pt will demonstrate increased grip strength bilaterally combined by at least 5 pounds (baseline = R= 38 pounds, L= 35 pounds)   Status On-going   OT SHORT TERM GOAL #3   Title Pt will demonstrate ability to  be min a with UB bathing at either bed or w/c level   Status On-going   OT SHORT TERM GOAL #4   Title Pt will demonstrate ability to dress UB with mod a at either bed or w/c level   Status On-going   OT SHORT TERM GOAL #5   Title Pt will be supervision for sitting balance with one UE support for at least 5 minutes of a task.   Status On-going           OT Long Term Goals - 09/21/14  1631    OT LONG TERM GOAL #1   Title Pt and wife will be mod I with upgraded HEP - 10/29/2014   Status On-going   OT LONG TERM GOAL #2   Title Pt will demonstrate ability to be  be supervision with UB bathing w/c level   Status On-going   OT LONG TERM GOAL #3   Title Pt will demonstrate ability to be min a with LB bathing    Status On-going   OT LONG TERM GOAL #4   Title Pt will demonstrate ability to dress UB with supervision at w/c level   Status On-going  OT LONG TERM GOAL #5   Status On-going               Plan - 09/21/14 1531    Clinical Impression Statement Patient reports improved performance with ADL at home.  Good progress toward OT goals   Pt will benefit from skilled therapeutic intervention in order to improve on the following deficits (Retired) Cardiopulmonary status limiting activity;Decreased activity tolerance;Decreased balance;Decreased cognition;Decreased mobility;Decreased knowledge of use of DME;Decreased knowledge of precautions;Decreased endurance;Decreased strength   OT Frequency 2x / week   OT Duration 8 weeks   OT Treatment/Interventions Self-care/ADL training;DME and/or AE instruction;Energy conservation;Therapeutic exercise;Therapist, nutritional;Therapeutic activities;Balance training;Patient/family education   Plan trunk control, strength, forward reach, sit to lift off         Problem List Patient Active Problem List   Diagnosis Date Noted  . Diarrhea 08/14/2014  . Dehydration 10/02/2013  . Fever 10/02/2013  . Altered mental status 10/01/2013  . Encephalopathy, toxic 10/01/2013  . Encephalopathy, metabolic 88/82/8003  . CVA (cerebral vascular accident) 09/28/2013  . FTT (failure to thrive) in adult 09/28/2013  . Acute confusional state 09/28/2013  . Ulcer of sacral region, stage 3 09/26/2013  . S/P BKA (below knee amputation) bilateral 09/08/2013  . ESRD on hemodialysis 05/09/2013  . TIA (transient ischemic attack) 02/20/2013  .  Acute blood loss anemia 10/28/2012  . Chronic combined systolic (EF 49%) and grade 2diastolic congestive heart failure 10/28/2012  . Obstipation 10/28/2012  . GI bleed 10/27/2012  . Mass in rectum 10/27/2012  . DM (diabetes mellitus) type I controlled with renal manifestation 09/08/2012  . LVH (left ventricular hypertrophy)-severe concentric 06/13/2012  . Anemia due to chronic illness 06/12/2012  . Hyperlipidemia 01/30/2011  . CHOLELITHIASIS 08/01/2010  . BENIGN PROSTATIC HYPERTROPHY 08/01/2010  . CEREBROVASCULAR ACCIDENT, HX OF 08/06/2009  . Depression 03/18/2009  . Acute combined systolic and diastolic heart failure, NYHA class 2-EF 45% 03/18/2009  . NEPHROLITHIASIS, HX OF 03/18/2009  . Morbid obesity 03/25/2007  . Essential hypertension 03/25/2007  . GERD 03/25/2007  . HEPATITIS C, HX OF 03/25/2007    Mariah Milling, OTR/L 09/21/2014, 4:33 PM  [facility] [address] [place], [place], [address] Phone: [phone]   Fax:  [phone]

## 2014-09-22 DIAGNOSIS — E1122 Type 2 diabetes mellitus with diabetic chronic kidney disease: Secondary | ICD-10-CM | POA: Diagnosis not present

## 2014-09-22 DIAGNOSIS — N186 End stage renal disease: Secondary | ICD-10-CM | POA: Diagnosis not present

## 2014-09-22 DIAGNOSIS — D631 Anemia in chronic kidney disease: Secondary | ICD-10-CM | POA: Diagnosis not present

## 2014-09-22 DIAGNOSIS — N2581 Secondary hyperparathyroidism of renal origin: Secondary | ICD-10-CM | POA: Diagnosis not present

## 2014-09-22 NOTE — Therapy (Signed)
[facility] [address] [place], [place], [address] Phone: [phone]   Fax:  [phone]  Physical Therapy Treatment  Patient Details  Name: Oscar Castillo MRN: [id number] Date of Birth: 12-28-1950 Referring Provider:  [name], MD  Encounter Date: 09/21/2014      PT End of Session - 09/21/14 1445    Visit Number 5   Number of Visits 34   Date for PT Re-Evaluation 11/02/14   PT Start Time 1400   PT Stop Time 1445   PT Time Calculation (min) 45 min   Equipment Utilized During Treatment Gait belt   Activity Tolerance Patient tolerated treatment well   Behavior During Therapy [facility] for tasks assessed/performed      Past Medical History  Diagnosis Date  . ESRD on hemodialysis 05/05/2007    ESRD due to DM/HTN. Started dialysis in November 2013.  HD TTS at [facility] on [place].  [name] BACK PAIN, LUMBAR, CHRONIC 08/06/2009  . BENIGN PROSTATIC HYPERTROPHY 08/01/2010  . CEREBROVASCULAR ACCIDENT, HX OF 08/06/2009  . CHOLELITHIASIS 08/01/2010  . CONGESTIVE HEART FAILURE 03/18/2009  . DEPRESSION 03/18/2009  . DIABETES MELLITUS, TYPE II 03/25/2007  . ERECTILE DYSFUNCTION 03/25/2007  . GERD 03/25/2007  . HEPATITIS C, HX OF 03/25/2007  . HYPERTENSION 03/25/2007  . Morbid obesity 03/25/2007  . NEPHROLITHIASIS, HX OF 03/18/2009  . Complication of anesthesia     wife states pt had trouble waking up with his last surgery in Nov., 2014    Past Surgical History  Procedure Laterality Date  . Nephrectomy      partial RR  . Av fistula placement  06/14/2012    Procedure: ARTERIOVENOUS (AV) FISTULA CREATION;  Surgeon: Angelia Mould, MD;  Location: [facility] OR;  Service: Vascular;  Laterality: Left;  Left basilic vein transposition with fistula.  . Tibia im nail insertion Left 09/09/2012    Procedure: INTRAMEDULLARY (IM) NAIL TIBIAL;  Surgeon: Johnny Bridge, MD;  Location: [facility];  Service: Orthopedics;  Laterality: Left;  left tibial  nail and open reduction internal fixation left fibula fracture  . Orif fibula fracture Left 09/09/2012    Procedure: OPEN REDUCTION INTERNAL FIXATION (ORIF) FIBULA FRACTURE;  Surgeon: Johnny Bridge, MD;  Location: [facility];  Service: Orthopedics;  Laterality: Left;  . Colonoscopy N/A 10/28/2012    Procedure: COLONOSCOPY;  Surgeon: Jeryl Columbia, MD;  Location: [facility] ENDOSCOPY;  Service: Endoscopy;  Laterality: N/A;  . Esophagogastroduodenoscopy N/A 11/02/2012    Procedure: ESOPHAGOGASTRODUODENOSCOPY (EGD);  Surgeon: Cleotis Nipper, MD;  Location: [facility] ENDOSCOPY;  Service: Endoscopy;  Laterality: N/A;  . Colonoscopy N/A 11/02/2012    Procedure: COLONOSCOPY;  Surgeon: Cleotis Nipper, MD;  Location: [facility] ENDOSCOPY;  Service: Endoscopy;  Laterality: N/A;  . Colonoscopy N/A 11/03/2012    Procedure: COLONOSCOPY;  Surgeon: Cleotis Nipper, MD;  Location: [facility] ENDOSCOPY;  Service: Endoscopy;  Laterality: N/A;  . Givens capsule study N/A 11/04/2012    Procedure: GIVENS CAPSULE STUDY;  Surgeon: Cleotis Nipper, MD;  Location: [facility] ENDOSCOPY;  Service: Endoscopy;  Laterality: N/A;  . Enteroscopy N/A 11/08/2012    Procedure: ENTEROSCOPY;  Surgeon: Wonda Horner, MD;  Location: [facility] ENDOSCOPY;  Service: Endoscopy;  Laterality: N/A;  . Amputation Left 05/12/2013    Procedure: AMPUTATION RAY;  Surgeon: Newt Minion, MD;  Location: [facility];  Service: Orthopedics;  Laterality: Left;  Left Foot 1st Ray Amputation  . Eye surgery Left  to remove scar tissue  . Amputation Left 06/09/2013    Procedure: AMPUTATION BELOW KNEE;  Surgeon: Newt Minion, MD;  Location: [facility];  Service: Orthopedics;  Laterality: Left;  Left Below Knee Amputation and removal proximal screws IM tibial nail  . Hardware removal Left 06/09/2013    Procedure: HARDWARE REMOVAL;  Surgeon: Newt Minion, MD;  Location: [place];  Service: Orthopedics;  Laterality: Left;  Left Below Knee Amputation  and Removal proximal screws IM tibial nail  . Amputation Right  09/08/2013    Procedure: AMPUTATION BELOW KNEE;  Surgeon: Newt Minion, MD;  Location: [place];  Service: Orthopedics;  Laterality: Right;  Right Below Knee Amputation  . Amputation Right 10/11/2013    Procedure: AMPUTATION BELOW KNEE;  Surgeon: Newt Minion, MD;  Location: [facility];  Service: Orthopedics;  Laterality: Right;  Right Below Knee Amputation Revision    There were no vitals taken for this visit.  Visit Diagnosis:  Impaired functional mobility and activity tolerance  Generalized weakness  Balance problems  Status post bilateral below knee amputation      Subjective Assessment - 09/21/14 1434    Symptoms wearing prostheses 2-4 hours on non-dialysis days. On dialysis days wears prostheses to dialysis, leaves liners on limbs while on dialysis, prostheses home and then 1-3 hours.   Currently in Pain? No/denies     Prosthetic Training:  PT assessed residual limbs with no skin changes. Patient's right prosthesis was not donned probably with air pocket distally. PT donned prostheses with demo / instructed in proper donning. Sit to stand w/c pulling on parallel bars with maximal assist 2X. PT demo pushing up, patient sit to stand with maximal assist.  Patient tolerated standing for 20 sec, 30 sec, 20 sec and 5 sec. Squat-pivot transfer to NuStep with minimal assist & verbal cues.  Therapeutic Exercise: NuStep with BUE & BLE level 2 X 5 minutes with focus on ROM Seated with LE on foot stool knee extension stretch 5 reps 30 sec hold each LE Propelling w/c 20' forward & 20' backwards with minimal assist.                       PT Education - 09/21/14 1445    Education provided Yes   Education Details importance of dialysis   Person(s) Educated Patient;Spouse   Methods Explanation   Comprehension Verbalized understanding          PT Short Term Goals - 09/03/14 1924    PT SHORT TERM GOAL #1   Title donnes prostheses with minimal assist (Target Date: 10/02/14)    Time 1   Period Months   Status New   PT SHORT TERM GOAL #2   Title wife & patient verbalize adjusting wear schedule to maximize functional potential. (Target Date: 10/02/14)   Time 1   Period Months   Status New   PT SHORT TERM GOAL #3   Title performs squat-pivot transfer w/c to level mat with minimal assist. (Target Date: 10/02/14)   Time 1   Period Months   Status New   PT SHORT TERM GOAL #4   Title Sit to/from stand w/c to parallel bars with moderate assist (Target Date: 10/02/14)   Time 1   Period Months   Status New   PT SHORT TERM GOAL #5   Title patient & wife demonstrate initial HEP correctly. (Target Date: 10/02/14)   Time 1   Period Months   Status New  PT Long Term Goals - 09/03/14 1015    PT LONG TERM GOAL #1   Title Patient and wife demonstrate proper prosthetic care. (Target Date: 01/02/15)   Time 4   Period Months   Status New   PT LONG TERM GOAL #2   Title tolerates wear of prostheses >90% of awake hours except at dialysis (Target Date: 01/02/15)   Time 4   Period Months   Status New   PT LONG TERM GOAL #3   Title stand pivot transfers with rolling walker & prostheses modified independent.  (Target Date: 01/02/15)   Time 4   Period Months   Status New   PT LONG TERM GOAL #4   Title manages clothes, reaches 5" and in upper /lower cabinets in standing with UE support modified independent.  (Target Date: 01/02/15)   Time 4   Period Months   Status New   PT LONG TERM GOAL #5   Title ambulate 60' with rolling walker & prostheses with minimal assist.  (Target Date: 01/02/15)   Time 4   Period Months   Status New               Plan - 09/21/14 1445    Clinical Impression Statement Patient was very exhausted today and reports he skipped dialysis yesterday. Patient tolerated less activities today due to this fact.   Pt will benefit from skilled therapeutic intervention in order to improve on the following deficits Abnormal gait;Decreased activity  tolerance;Decreased balance;Decreased endurance;Decreased knowledge of use of DME;Decreased mobility;Decreased range of motion;Decreased strength;Difficulty walking;Other (comment)  prosthetic dependency   Rehab Potential Good   Clinical Impairments Affecting Rehab Potential multiple medical issues over last year causing decondtioning   PT Frequency 2x / week   PT Duration Other (comment)  4 months   PT Treatment/Interventions ADLs/Self Care Home Management;Gait training;Stair training;DME Instruction;Functional mobility training;Therapeutic activities;Therapeutic exercise;Balance training;Neuromuscular re-education;Patient/family education;Manual techniques;Other (comment)  prosthetic training   PT Next Visit Plan bed mobility training, strengthening and flexibility exercises on mat both with and without bil prostheses, standing in parallel bars   PT Home Exercise Plan review seated HEP   Consulted and Agree with Plan of Care Patient;Family member/caregiver   Family Member Consulted wife        Problem List Patient Active Problem List   Diagnosis Date Noted  . Diarrhea 08/14/2014  . Dehydration 10/02/2013  . Fever 10/02/2013  . Altered mental status 10/01/2013  . Encephalopathy, toxic 10/01/2013  . Encephalopathy, metabolic 44/07/270  . CVA (cerebral vascular accident) 09/28/2013  . FTT (failure to thrive) in adult 09/28/2013  . Acute confusional state 09/28/2013  . Ulcer of sacral region, stage 3 09/26/2013  . S/P BKA (below knee amputation) bilateral 09/08/2013  . ESRD on hemodialysis 05/09/2013  . TIA (transient ischemic attack) 02/20/2013  . Acute blood loss anemia 10/28/2012  . Chronic combined systolic (EF 53%) and grade 2diastolic congestive heart failure 10/28/2012  . Obstipation 10/28/2012  . GI bleed 10/27/2012  . Mass in rectum 10/27/2012  . DM (diabetes mellitus) type I controlled with renal manifestation 09/08/2012  . LVH (left ventricular hypertrophy)-severe  concentric 06/13/2012  . Anemia due to chronic illness 06/12/2012  . Hyperlipidemia 01/30/2011  . CHOLELITHIASIS 08/01/2010  . BENIGN PROSTATIC HYPERTROPHY 08/01/2010  . CEREBROVASCULAR ACCIDENT, HX OF 08/06/2009  . Depression 03/18/2009  . Acute combined systolic and diastolic heart failure, NYHA class 2-EF 45% 03/18/2009  . NEPHROLITHIASIS, HX OF 03/18/2009  . Morbid obesity 03/25/2007  . Essential  hypertension 03/25/2007  . GERD 03/25/2007  . HEPATITIS C, HX OF 03/25/2007    Jamey Reas PT, DPT  Physical Therapist Specializing in Prosthetics 09/22/2014, 12:26 AM  [facility] [address] [place], [place], [address] Phone: [phone]   Fax:  [phone]

## 2014-09-24 ENCOUNTER — Encounter: Payer: Self-pay | Admitting: Physical Therapy

## 2014-09-24 ENCOUNTER — Ambulatory Visit: Payer: Medicare Other | Admitting: Physical Therapy

## 2014-09-24 ENCOUNTER — Ambulatory Visit: Payer: Medicare Other | Admitting: Occupational Therapy

## 2014-09-24 ENCOUNTER — Encounter: Payer: Self-pay | Admitting: Occupational Therapy

## 2014-09-24 DIAGNOSIS — N186 End stage renal disease: Secondary | ICD-10-CM | POA: Diagnosis not present

## 2014-09-24 DIAGNOSIS — Z7409 Other reduced mobility: Secondary | ICD-10-CM

## 2014-09-24 DIAGNOSIS — R531 Weakness: Secondary | ICD-10-CM

## 2014-09-24 DIAGNOSIS — Z992 Dependence on renal dialysis: Secondary | ICD-10-CM | POA: Diagnosis not present

## 2014-09-24 DIAGNOSIS — R5381 Other malaise: Secondary | ICD-10-CM | POA: Diagnosis not present

## 2014-09-24 DIAGNOSIS — R4189 Other symptoms and signs involving cognitive functions and awareness: Secondary | ICD-10-CM | POA: Diagnosis not present

## 2014-09-24 DIAGNOSIS — Z89512 Acquired absence of left leg below knee: Secondary | ICD-10-CM | POA: Diagnosis not present

## 2014-09-24 DIAGNOSIS — Z89511 Acquired absence of right leg below knee: Secondary | ICD-10-CM | POA: Diagnosis not present

## 2014-09-24 NOTE — Therapy (Signed)
[facility] [address] [place], [place], [address] Phone: [phone]   Fax:  [phone]  Physical Therapy Treatment  Patient Details  Name: Oscar Castillo MRN: [id number] Date of Birth: 1951-05-07 Referring Provider:  [name], MD  Encounter Date: 09/24/2014      PT End of Session - 09/24/14 1943    Visit Number 6   Number of Visits 34   Date for PT Re-Evaluation 11/02/14   PT Start Time 3149   PT Stop Time 1524   PT Time Calculation (min) 39 min   Equipment Utilized During Treatment Gait belt   Activity Tolerance Patient tolerated treatment well   Behavior During Therapy [facility] for tasks assessed/performed      Past Medical History  Diagnosis Date  . ESRD on hemodialysis 05/05/2007    ESRD due to DM/HTN. Started dialysis in November 2013.  HD TTS at [facility] on [place].  [name] BACK PAIN, LUMBAR, CHRONIC 08/06/2009  . BENIGN PROSTATIC HYPERTROPHY 08/01/2010  . CEREBROVASCULAR ACCIDENT, HX OF 08/06/2009  . CHOLELITHIASIS 08/01/2010  . CONGESTIVE HEART FAILURE 03/18/2009  . DEPRESSION 03/18/2009  . DIABETES MELLITUS, TYPE II 03/25/2007  . ERECTILE DYSFUNCTION 03/25/2007  . GERD 03/25/2007  . HEPATITIS C, HX OF 03/25/2007  . HYPERTENSION 03/25/2007  . Morbid obesity 03/25/2007  . NEPHROLITHIASIS, HX OF 03/18/2009  . Complication of anesthesia     wife states pt had trouble waking up with his last surgery in Nov., 2014    Past Surgical History  Procedure Laterality Date  . Nephrectomy      partial RR  . Av fistula placement  06/14/2012    Procedure: ARTERIOVENOUS (AV) FISTULA CREATION;  Surgeon: Angelia Mould, MD;  Location: [facility] OR;  Service: Vascular;  Laterality: Left;  Left basilic vein transposition with fistula.  . Tibia im nail insertion Left 09/09/2012    Procedure: INTRAMEDULLARY (IM) NAIL TIBIAL;  Surgeon: Johnny Bridge, MD;  Location: [facility];  Service: Orthopedics;  Laterality: Left;  left tibial  nail and open reduction internal fixation left fibula fracture  . Orif fibula fracture Left 09/09/2012    Procedure: OPEN REDUCTION INTERNAL FIXATION (ORIF) FIBULA FRACTURE;  Surgeon: Johnny Bridge, MD;  Location: [facility];  Service: Orthopedics;  Laterality: Left;  . Colonoscopy N/A 10/28/2012    Procedure: COLONOSCOPY;  Surgeon: Jeryl Columbia, MD;  Location: [facility] ENDOSCOPY;  Service: Endoscopy;  Laterality: N/A;  . Esophagogastroduodenoscopy N/A 11/02/2012    Procedure: ESOPHAGOGASTRODUODENOSCOPY (EGD);  Surgeon: Cleotis Nipper, MD;  Location: [facility] ENDOSCOPY;  Service: Endoscopy;  Laterality: N/A;  . Colonoscopy N/A 11/02/2012    Procedure: COLONOSCOPY;  Surgeon: Cleotis Nipper, MD;  Location: [facility] ENDOSCOPY;  Service: Endoscopy;  Laterality: N/A;  . Colonoscopy N/A 11/03/2012    Procedure: COLONOSCOPY;  Surgeon: Cleotis Nipper, MD;  Location: [facility] ENDOSCOPY;  Service: Endoscopy;  Laterality: N/A;  . Givens capsule study N/A 11/04/2012    Procedure: GIVENS CAPSULE STUDY;  Surgeon: Cleotis Nipper, MD;  Location: [facility] ENDOSCOPY;  Service: Endoscopy;  Laterality: N/A;  . Enteroscopy N/A 11/08/2012    Procedure: ENTEROSCOPY;  Surgeon: Wonda Horner, MD;  Location: [facility] ENDOSCOPY;  Service: Endoscopy;  Laterality: N/A;  . Amputation Left 05/12/2013    Procedure: AMPUTATION RAY;  Surgeon: Newt Minion, MD;  Location: [place];  Service: Orthopedics;  Laterality: Left;  Left Foot 1st Ray Amputation  . Eye surgery Left  to remove scar tissue  . Amputation Left 06/09/2013    Procedure: AMPUTATION BELOW KNEE;  Surgeon: Newt Minion, MD;  Location: [place];  Service: Orthopedics;  Laterality: Left;  Left Below Knee Amputation and removal proximal screws IM tibial nail  . Hardware removal Left 06/09/2013    Procedure: HARDWARE REMOVAL;  Surgeon: Newt Minion, MD;  Location: [place];  Service: Orthopedics;  Laterality: Left;  Left Below Knee Amputation  and Removal proximal screws IM tibial nail  . Amputation Right  09/08/2013    Procedure: AMPUTATION BELOW KNEE;  Surgeon: Newt Minion, MD;  Location: [place];  Service: Orthopedics;  Laterality: Right;  Right Below Knee Amputation  . Amputation Right 10/11/2013    Procedure: AMPUTATION BELOW KNEE;  Surgeon: Newt Minion, MD;  Location: [place];  Service: Orthopedics;  Laterality: Right;  Right Below Knee Amputation Revision    There were no vitals taken for this visit.  Visit Diagnosis:  Generalized weakness  Impaired functional mobility and activity tolerance      Subjective Assessment - 09/24/14 1445    Symptoms no new complaints. Reports his back is hurting today.   Currently in Pain? Yes   Pain Score 5    Pain Location Back   Pain Orientation Posterior;Right   Pain Type Acute pain   Pain Onset Yesterday   Pain Frequency Constant   Aggravating Factors  movement   Pain Relieving Factors nothing known          [facility] Adult PT Treatment/Exercise - 09/24/14 1942    Prosthetics   Current prosthetic wear tolerance (days/week)  7 days   Current prosthetic wear tolerance (#hours/day)  2-3 hours a day   Education Provided Residual limb care;Correct ply sock adjustment;Proper wear schedule/adjustment   Person(s) Educated Patient;Spouse   Education Method Explanation;Demonstration;Verbal cues   Education Method Verbalized understanding;Needs further instruction   Donning Prosthesis Moderate assist   Doffing Prosthesis Moderate assist     Treatment: Transfers - min assist for sit to supine on mat with cues on technique and use of arms. - mod assist for rolling to side and then side to sitting edge of mat with cues on technique/hand placement. - min assist for lateral scooting mat to wheelchair with cues and increased time needed with transfer.  Exercises: Seated on edge of mat - with inverted chair behind pt: curl ups 2 sets of 10 with cues on midline position and min assist to complete the first 5 reps, then supervision. - upper trunk  rotation both ways with reaching behind him with same side arm x 10 each way - Upper arm reaching to ceiling with lean toward that side for trunk elongation and stretching - alternating marches with prostheses on x 10 each side - with yellow theraband: hip abduction/ER x 10 reps  Supine on mat with prostheses on 5 - bridge x 5 reps with prostheses off and limbs on p-roll - bridge x 5 reps - curl up forward 2 sets of 10 reps - lateral reaching curl ups x 10 each way with rest midway - with towel roll under one limb at a time at calf (other on p-roll): knee press x 10 reps each side; knee extension stretch with gentle overpressure 1 minute hold x 2 reps each side.  Wheel chair scooting with prostheses only 20 feet each forward/backwards with min assist.        PT Short Term Goals - 09/03/14 1924    PT SHORT  TERM GOAL #1   Title donnes prostheses with minimal assist (Target Date: 10/02/14)   Time 1   Period Months   Status New   PT SHORT TERM GOAL #2   Title wife & patient verbalize adjusting wear schedule to maximize functional potential. (Target Date: 10/02/14)   Time 1   Period Months   Status New   PT SHORT TERM GOAL #3   Title performs squat-pivot transfer w/c to level mat with minimal assist. (Target Date: 10/02/14)   Time 1   Period Months   Status New   PT SHORT TERM GOAL #4   Title Sit to/from stand w/c to parallel bars with moderate assist (Target Date: 10/02/14)   Time 1   Period Months   Status New   PT SHORT TERM GOAL #5   Title patient & wife demonstrate initial HEP correctly. (Target Date: 10/02/14)   Time 1   Period Months   Status New           PT Long Term Goals - 09/03/14 1015    PT LONG TERM GOAL #1   Title Patient and wife demonstrate proper prosthetic care. (Target Date: 01/02/15)   Time 4   Period Months   Status New   PT LONG TERM GOAL #2   Title tolerates wear of prostheses >90% of awake hours except at dialysis (Target Date: 01/02/15)   Time 4    Period Months   Status New   PT LONG TERM GOAL #3   Title stand pivot transfers with rolling walker & prostheses modified independent.  (Target Date: 01/02/15)   Time 4   Period Months   Status New   PT LONG TERM GOAL #4   Title manages clothes, reaches 5" and in upper /lower cabinets in standing with UE support modified independent.  (Target Date: 01/02/15)   Time 4   Period Months   Status New   PT LONG TERM GOAL #5   Title ambulate 80' with rolling walker & prostheses with minimal assist.  (Target Date: 01/02/15)   Time 4   Period Months   Status New            Plan - 09/24/14 1944    Clinical Impression Statement Pt needed moderate encourgement for maximal participation today. Limited by back pain reports with all exercises and activities today.   Pt will benefit from skilled therapeutic intervention in order to improve on the following deficits Abnormal gait;Decreased activity tolerance;Decreased balance;Decreased endurance;Decreased knowledge of use of DME;Decreased mobility;Decreased range of motion;Decreased strength;Difficulty walking;Other (comment)  prosthetic dependency   Rehab Potential Good   Clinical Impairments Affecting Rehab Potential multiple medical issues over last year causing decondtioning   PT Frequency 2x / week   PT Duration Other (comment)  4 months   PT Treatment/Interventions ADLs/Self Care Home Management;Gait training;Stair training;DME Instruction;Functional mobility training;Therapeutic activities;Therapeutic exercise;Balance training;Neuromuscular re-education;Patient/family education;Manual techniques;Other (comment)  prosthetic training   PT Next Visit Plan bed mobility training, strengthening and flexibility exercises on mat both with and without bil prostheses, standing in parallel bars   PT Home Exercise Plan review seated HEP   Consulted and Agree with Plan of Care Patient;Family member/caregiver   Family Member Consulted wife       Problem  List Patient Active Problem List   Diagnosis Date Noted  . Diarrhea 08/14/2014  . Dehydration 10/02/2013  . Fever 10/02/2013  . Altered mental status 10/01/2013  . Encephalopathy, toxic 10/01/2013  . Encephalopathy, metabolic 19/50/9326  .  CVA (cerebral vascular accident) 09/28/2013  . FTT (failure to thrive) in adult 09/28/2013  . Acute confusional state 09/28/2013  . Ulcer of sacral region, stage 3 09/26/2013  . S/P BKA (below knee amputation) bilateral 09/08/2013  . ESRD on hemodialysis 05/09/2013  . TIA (transient ischemic attack) 02/20/2013  . Acute blood loss anemia 10/28/2012  . Chronic combined systolic (EF 25%) and grade 2diastolic congestive heart failure 10/28/2012  . Obstipation 10/28/2012  . GI bleed 10/27/2012  . Mass in rectum 10/27/2012  . DM (diabetes mellitus) type I controlled with renal manifestation 09/08/2012  . LVH (left ventricular hypertrophy)-severe concentric 06/13/2012  . Anemia due to chronic illness 06/12/2012  . Hyperlipidemia 01/30/2011  . CHOLELITHIASIS 08/01/2010  . BENIGN PROSTATIC HYPERTROPHY 08/01/2010  . CEREBROVASCULAR ACCIDENT, HX OF 08/06/2009  . Depression 03/18/2009  . Acute combined systolic and diastolic heart failure, NYHA class 2-EF 45% 03/18/2009  . NEPHROLITHIASIS, HX OF 03/18/2009  . Morbid obesity 03/25/2007  . Essential hypertension 03/25/2007  . GERD 03/25/2007  . HEPATITIS C, HX OF 03/25/2007    Willow Ora 09/24/2014, 7:45 PM  Willow Ora, PTA, [facility] [address], [place], [place] [address] [phone] 09/24/2014, 7:45 PM

## 2014-09-24 NOTE — Therapy (Signed)
[facility] [facility] [address] [place], [place], [address] Phone: [phone]   Fax:  [phone]  Occupational Therapy Treatment  Patient Details  Name: Oscar Castillo MRN: [id number] Date of Birth: 1950-12-11 Referring Provider:  [name], MD  Encounter Date: 09/24/2014      OT End of Session - 09/24/14 1647    Visit Number 6   Number of Visits 16   Date for OT Re-Evaluation 10/29/14   Authorization Type medicare needs G code   OT Start Time 1400   OT Stop Time 1443   OT Time Calculation (min) 43 min   Activity Tolerance Patient limited by pain      Past Medical History  Diagnosis Date  . ESRD on hemodialysis 05/05/2007    ESRD due to DM/HTN. Started dialysis in November 2013.  HD TTS at [facility] [address].  [name] BACK PAIN, LUMBAR, CHRONIC 08/06/2009  . BENIGN PROSTATIC HYPERTROPHY 08/01/2010  . CEREBROVASCULAR ACCIDENT, HX OF 08/06/2009  . CHOLELITHIASIS 08/01/2010  . CONGESTIVE HEART FAILURE 03/18/2009  . DEPRESSION 03/18/2009  . DIABETES MELLITUS, TYPE II 03/25/2007  . ERECTILE DYSFUNCTION 03/25/2007  . GERD 03/25/2007  . HEPATITIS C, HX OF 03/25/2007  . HYPERTENSION 03/25/2007  . Morbid obesity 03/25/2007  . NEPHROLITHIASIS, HX OF 03/18/2009  . Complication of anesthesia     wife states pt had trouble waking up with his last surgery in Nov., 2014    Past Surgical History  Procedure Laterality Date  . Nephrectomy      partial RR  . Av fistula placement  06/14/2012    Procedure: ARTERIOVENOUS (AV) FISTULA CREATION;  Surgeon: Angelia Mould, MD;  Location: [facility] OR;  Service: Vascular;  Laterality: Left;  Left basilic vein transposition with fistula.  . Tibia im nail insertion Left 09/09/2012    Procedure: INTRAMEDULLARY (IM) NAIL TIBIAL;  Surgeon: Johnny Bridge, MD;  Location: [place];  Service: Orthopedics;  Laterality: Left;  left tibial nail and open reduction internal fixation left fibula fracture  .  Orif fibula fracture Left 09/09/2012    Procedure: OPEN REDUCTION INTERNAL FIXATION (ORIF) FIBULA FRACTURE;  Surgeon: Johnny Bridge, MD;  Location: [place];  Service: Orthopedics;  Laterality: Left;  . Colonoscopy N/A 10/28/2012    Procedure: COLONOSCOPY;  Surgeon: Jeryl Columbia, MD;  Location[facility] ENDOSCOPY;  Service: Endoscopy;  Laterality: N/A;  . Esophagogastroduodenoscopy N/A 11/02/2012    Procedure: ESOPHAGOGASTRODUODENOSCOPY (EGD);  Surgeon: Cleotis Nipper, MD;  Location: [facility] ENDOSCOPY;  Service: Endoscopy;  Laterality: N/A;  . Colonoscopy N/A 11/02/2012    Procedure: COLONOSCOPY;  Surgeon: Cleotis Nipper, MD;  Location: [facility] ENDOSCOPY;  Service: Endoscopy;  Laterality: N/A;  . Colonoscopy N/A 11/03/2012    Procedure: COLONOSCOPY;  Surgeon: Cleotis Nipper, MD;  Location: [facility] ENDOSCOPY;  Service: Endoscopy;  Laterality: N/A;  . Givens capsule study N/A 11/04/2012    Procedure: GIVENS CAPSULE STUDY;  Surgeon: Cleotis Nipper, MD;  Location: [facility] ENDOSCOPY;  Service: Endoscopy;  Laterality: N/A;  . Enteroscopy N/A 11/08/2012    Procedure: ENTEROSCOPY;  Surgeon: Wonda Horner, MD;  Location: [facility] ENDOSCOPY;  Service: Endoscopy;  Laterality: N/A;  . Amputation Left 05/12/2013    Procedure: AMPUTATION RAY;  Surgeon: Newt Minion, MD;  Location: [place];  Service: Orthopedics;  Laterality: Left;  Left Foot 1st Ray Amputation  . Eye surgery Left     to remove scar tissue  . Amputation Left 06/09/2013  Procedure: AMPUTATION BELOW KNEE;  Surgeon: Newt Minion, MD;  Location: [place];  Service: Orthopedics;  Laterality: Left;  Left Below Knee Amputation and removal proximal screws IM tibial nail  . Hardware removal Left 06/09/2013    Procedure: HARDWARE REMOVAL;  Surgeon: Newt Minion, MD;  Location: [place];  Service: Orthopedics;  Laterality: Left;  Left Below Knee Amputation  and Removal proximal screws IM tibial nail  . Amputation Right 09/08/2013    Procedure: AMPUTATION BELOW KNEE;  Surgeon: Newt Minion,  MD;  Location: [place];  Service: Orthopedics;  Laterality: Right;  Right Below Knee Amputation  . Amputation Right 10/11/2013    Procedure: AMPUTATION BELOW KNEE;  Surgeon: Newt Minion, MD;  Location: [place];  Service: Orthopedics;  Laterality: Right;  Right Below Knee Amputation Revision    There were no vitals taken for this visit.  Visit Diagnosis:  Impaired functional mobility and activity tolerance  Generalized weakness      Subjective Assessment - 09/24/14 1409    Symptoms I don't feel good today   Pertinent History see epic snapshot   Currently in Pain? Yes   Pain Score 8    Pain Location Back   Pain Orientation Posterior;Right   Pain Descriptors / Indicators Stabbing   Pain Type Acute pain  started today   Pain Onset Yesterday   Pain Frequency Constant   Aggravating Factors  moving around   Pain Relieving Factors nothing                 OT Treatments/Exercises (OP) - 09/24/14 0001    Neurological Re-education Exercises   Other Exercises 1 Neuro re ed to address trunk control (A/P tilts and hold, rotation with erect and active trunk), push ups with push up blocks as precursor to sit to squat and sit to stand.   Modalities   Modalities --   Moist Heat Therapy   Number Minutes Moist Heat 8 Minutes   Moist Heat Location Other (comment)  left mid back (mid lats)   Manual Therapy   Manual Therapy Myofascial release   Myofascial Release myofascial release to mid lats to decrease pain and tightness. Feel pt is having musculature discomfort due to uwing muscles that he is not used to using. Pt  is  much more active in transfers and ADL' s at home and is spending far more time out of bed..                  OT Short Term Goals - 09/24/14 1649    OT SHORT TERM GOAL #1   Title Pt and wife will be mod I with HEP - 10/01/2014   Status On-going   OT SHORT TERM GOAL #2   Title Pt will demonstrate increased grip strength bilaterally combined by at least 5  pounds (baseline = R= 38 pounds, L= 35 pounds)   Status On-going   OT SHORT TERM GOAL #3   Title Pt will demonstrate ability to  be min a with UB bathing at either bed or w/c level   Status On-going   OT SHORT TERM GOAL #4   Title Pt will demonstrate ability to dress UB with mod a at either bed or w/c level   Status On-going   OT SHORT TERM GOAL #5   Title Pt will be supervision for sitting balance with one UE support for at least 5 minutes of a task.   Status On-going  OT Long Term Goals - 09/24/14 1649    OT LONG TERM GOAL #1   Title Pt and wife will be mod I with upgraded HEP - 10/29/2014   Status On-going   OT LONG TERM GOAL #2   Title Pt will demonstrate ability to be  be supervision with UB bathing w/c level   Status On-going   OT LONG TERM GOAL #3   Title Pt will demonstrate ability to be min a with LB bathing    Status On-going   OT LONG TERM GOAL #4   Title Pt will demonstrate ability to dress UB with supervision at w/c level   Status On-going   OT LONG TERM GOAL #5   Status On-going               Plan - 09/24/14 1647    Clinical Impression Statement Pt c/o  back pain today but with max encouragement able to work through this.   Pt will benefit from skilled therapeutic intervention in order to improve on the following deficits (Retired) Cardiopulmonary status limiting activity;Decreased activity tolerance;Decreased balance;Decreased cognition;Decreased mobility;Decreased knowledge of use of DME;Decreased knowledge of precautions;Decreased endurance;Decreased strength   Rehab Potential Fair   OT Frequency 2x / week   OT Duration 8 weeks   OT Treatment/Interventions Self-care/ADL training;DME and/or AE instruction;Energy conservation;Therapeutic exercise;Therapist, nutritional;Therapeutic activities;Balance training;Patient/family education   Plan activity tolerance, transfers, sit to squat, sitting balance.   Consulted and Agree with Plan of Care  Patient        Problem List Patient Active Problem List   Diagnosis Date Noted  . Diarrhea 08/14/2014  . Dehydration 10/02/2013  . Fever 10/02/2013  . Altered mental status 10/01/2013  . Encephalopathy, toxic 10/01/2013  . Encephalopathy, metabolic 03/47/4259  . CVA (cerebral vascular accident) 09/28/2013  . FTT (failure to thrive) in adult 09/28/2013  . Acute confusional state 09/28/2013  . Ulcer of sacral region, stage 3 09/26/2013  . S/P BKA (below knee amputation) bilateral 09/08/2013  . ESRD on hemodialysis 05/09/2013  . TIA (transient ischemic attack) 02/20/2013  . Acute blood loss anemia 10/28/2012  . Chronic combined systolic (EF 56%) and grade 2diastolic congestive heart failure 10/28/2012  . Obstipation 10/28/2012  . GI bleed 10/27/2012  . Mass in rectum 10/27/2012  . DM (diabetes mellitus) type I controlled with renal manifestation 09/08/2012  . LVH (left ventricular hypertrophy)-severe concentric 06/13/2012  . Anemia due to chronic illness 06/12/2012  . Hyperlipidemia 01/30/2011  . CHOLELITHIASIS 08/01/2010  . BENIGN PROSTATIC HYPERTROPHY 08/01/2010  . CEREBROVASCULAR ACCIDENT, HX OF 08/06/2009  . Depression 03/18/2009  . Acute combined systolic and diastolic heart failure, NYHA class 2-EF 45% 03/18/2009  . NEPHROLITHIASIS, HX OF 03/18/2009  . Morbid obesity 03/25/2007  . Essential hypertension 03/25/2007  . GERD 03/25/2007  . HEPATITIS C, HX OF 03/25/2007    Quay Burow, OTR/L 09/24/2014, 4:53 PM  [place] [address] [place], [place], [address] Phone: [phone]   Fax:  [phone]

## 2014-09-25 DIAGNOSIS — N186 End stage renal disease: Secondary | ICD-10-CM | POA: Diagnosis not present

## 2014-09-25 DIAGNOSIS — D509 Iron deficiency anemia, unspecified: Secondary | ICD-10-CM | POA: Diagnosis not present

## 2014-09-25 DIAGNOSIS — N2581 Secondary hyperparathyroidism of renal origin: Secondary | ICD-10-CM | POA: Diagnosis not present

## 2014-09-25 DIAGNOSIS — E1122 Type 2 diabetes mellitus with diabetic chronic kidney disease: Secondary | ICD-10-CM | POA: Diagnosis not present

## 2014-09-26 ENCOUNTER — Encounter: Payer: Self-pay | Admitting: Physical Therapy

## 2014-09-26 ENCOUNTER — Ambulatory Visit: Payer: Medicare Other | Attending: Internal Medicine | Admitting: Physical Therapy

## 2014-09-26 ENCOUNTER — Encounter: Payer: Self-pay | Admitting: Occupational Therapy

## 2014-09-26 ENCOUNTER — Ambulatory Visit: Payer: Medicare Other | Admitting: Occupational Therapy

## 2014-09-26 DIAGNOSIS — Z7409 Other reduced mobility: Secondary | ICD-10-CM | POA: Diagnosis not present

## 2014-09-26 DIAGNOSIS — Z89512 Acquired absence of left leg below knee: Secondary | ICD-10-CM | POA: Diagnosis not present

## 2014-09-26 DIAGNOSIS — R4189 Other symptoms and signs involving cognitive functions and awareness: Secondary | ICD-10-CM | POA: Insufficient documentation

## 2014-09-26 DIAGNOSIS — R531 Weakness: Secondary | ICD-10-CM | POA: Insufficient documentation

## 2014-09-26 DIAGNOSIS — R5381 Other malaise: Secondary | ICD-10-CM | POA: Insufficient documentation

## 2014-09-26 DIAGNOSIS — R2689 Other abnormalities of gait and mobility: Secondary | ICD-10-CM

## 2014-09-26 DIAGNOSIS — Z89511 Acquired absence of right leg below knee: Secondary | ICD-10-CM

## 2014-09-26 NOTE — Therapy (Signed)
[facility] [address] [place], [place], [address] Phone: [phone]   Fax:  [phone]  Occupational Therapy Treatment  Patient Details  Name: Oscar Castillo MRN: [id number] Date of Birth: October 12, 1950 Referring Provider:  [name], MD  Encounter Date: 09/26/2014      OT End of Session - 09/26/14 1529    Visit Number 7   Number of Visits 16   Date for OT Re-Evaluation 10/29/14   Authorization Type medicare needs G code   OT Start Time 0932   OT Stop Time 1525   OT Time Calculation (min) 40 min   Equipment Utilized During Treatment push up blocks, straight chair with arms   Activity Tolerance Patient limited by fatigue   Behavior During Therapy Anxious      Past Medical History  Diagnosis Date  . ESRD on hemodialysis 05/05/2007    ESRD due to DM/HTN. Started dialysis in November 2013.  HD TTS at [facility] on [address].  [name] BACK PAIN, LUMBAR, CHRONIC 08/06/2009  . BENIGN PROSTATIC HYPERTROPHY 08/01/2010  . CEREBROVASCULAR ACCIDENT, HX OF 08/06/2009  . CHOLELITHIASIS 08/01/2010  . CONGESTIVE HEART FAILURE 03/18/2009  . DEPRESSION 03/18/2009  . DIABETES MELLITUS, TYPE II 03/25/2007  . ERECTILE DYSFUNCTION 03/25/2007  . GERD 03/25/2007  . HEPATITIS C, HX OF 03/25/2007  . HYPERTENSION 03/25/2007  . Morbid obesity 03/25/2007  . NEPHROLITHIASIS, HX OF 03/18/2009  . Complication of anesthesia     wife states pt had trouble waking up with his last surgery in Nov., 2014    Past Surgical History  Procedure Laterality Date  . Nephrectomy      partial RR  . Av fistula placement  06/14/2012    Procedure: ARTERIOVENOUS (AV) FISTULA CREATION;  Surgeon: Angelia Mould, MD;  Location: [facility] OR;  Service: Vascular;  Laterality: Left;  Left basilic vein transposition with fistula.  . Tibia im nail insertion Left 09/09/2012    Procedure: INTRAMEDULLARY (IM) NAIL TIBIAL;  Surgeon: Johnny Bridge, MD;  Location: [place];   Service: Orthopedics;  Laterality: Left;  left tibial nail and open reduction internal fixation left fibula fracture  . Orif fibula fracture Left 09/09/2012    Procedure: OPEN REDUCTION INTERNAL FIXATION (ORIF) FIBULA FRACTURE;  Surgeon: Johnny Bridge, MD;  Location: [place];  Service: Orthopedics;  Laterality: Left;  . Colonoscopy N/A 10/28/2012    Procedure: COLONOSCOPY;  Surgeon: Jeryl Columbia, MD;  Location: [facility] ENDOSCOPY;  Service: Endoscopy;  Laterality: N/A;  . Esophagogastroduodenoscopy N/A 11/02/2012    Procedure: ESOPHAGOGASTRODUODENOSCOPY (EGD);  Surgeon: Cleotis Nipper, MD;  Location: [facility] ENDOSCOPY;  Service: Endoscopy;  Laterality: N/A;  . Colonoscopy N/A 11/02/2012    Procedure: COLONOSCOPY;  Surgeon: Cleotis Nipper, MD;  Location: [facility] ENDOSCOPY;  Service: Endoscopy;  Laterality: N/A;  . Colonoscopy N/A 11/03/2012    Procedure: COLONOSCOPY;  Surgeon: Cleotis Nipper, MD;  Location: [facility] ENDOSCOPY;  Service: Endoscopy;  Laterality: N/A;  . Givens capsule study N/A 11/04/2012    Procedure: GIVENS CAPSULE STUDY;  Surgeon: Cleotis Nipper, MD;  Location: [facility] ENDOSCOPY;  Service: Endoscopy;  Laterality: N/A;  . Enteroscopy N/A 11/08/2012    Procedure: ENTEROSCOPY;  Surgeon: Wonda Horner, MD;  Location: [name] ENDOSCOPY;  Service: Endoscopy;  Laterality: N/A;  . Amputation Left 05/12/2013    Procedure: AMPUTATION RAY;  Surgeon: Newt Minion, MD;  Location: [place];  Service: Orthopedics;  Laterality: Left;  Left Foot 1st Ray  Amputation  . Eye surgery Left     to remove scar tissue  . Amputation Left 06/09/2013    Procedure: AMPUTATION BELOW KNEE;  Surgeon: Newt Minion, MD;  Location: [place];  Service: Orthopedics;  Laterality: Left;  Left Below Knee Amputation and removal proximal screws IM tibial nail  . Hardware removal Left 06/09/2013    Procedure: HARDWARE REMOVAL;  Surgeon: Newt Minion, MD;  Location: [place];  Service: Orthopedics;  Laterality: Left;  Left Below Knee Amputation  and Removal  proximal screws IM tibial nail  . Amputation Right 09/08/2013    Procedure: AMPUTATION BELOW KNEE;  Surgeon: Newt Minion, MD;  Location: [place];  Service: Orthopedics;  Laterality: Right;  Right Below Knee Amputation  . Amputation Right 10/11/2013    Procedure: AMPUTATION BELOW KNEE;  Surgeon: Newt Minion, MD;  Location: [place];  Service: Orthopedics;  Laterality: Right;  Right Below Knee Amputation Revision    There were no vitals taken for this visit.  Visit Diagnosis:  Impaired functional mobility and activity tolerance  Generalized weakness      Subjective Assessment - 09/26/14 1454    Symptoms I am not used to this. Like a baby.   Pertinent History see epic snapshot   Currently in Pain? Yes   Pain Score 6    Pain Location Back   Pain Orientation Posterior   Pain Descriptors / Indicators Aching                 OT Treatments/Exercises (OP) - 09/26/14 1506    Transfers   Comments squat pivot transfer with Min assist, level surface   ADLs   Functional Mobility Practiced transferring from raised mat table into chair with arms to encourage lifting up off surface as needed for commode transfer.     Shoulder Exercises: Seated   Other Seated Exercises seated push ups with push up blocks, initially x 5, hold three seconds each, then 10 x repetitions, hold for 5 seconds                OT Education - 09/26/14 1529    Education provided Yes   Education Details activity improvement since onset of therapy   Person(s) Educated Patient;Spouse   Methods Explanation   Comprehension Verbal cues required          OT Short Term Goals - 09/24/14 1649    OT SHORT TERM GOAL #1   Title Pt and wife will be mod I with HEP - 10/01/2014   Status On-going   OT SHORT TERM GOAL #2   Title Pt will demonstrate increased grip strength bilaterally combined by at least 5 pounds (baseline = R= 38 pounds, L= 35 pounds)   Status On-going   OT SHORT TERM GOAL #3   Title Pt will  demonstrate ability to  be min a with UB bathing at either bed or w/c level   Status On-going   OT SHORT TERM GOAL #4   Title Pt will demonstrate ability to dress UB with mod a at either bed or w/c level   Status On-going   OT SHORT TERM GOAL #5   Title Pt will be supervision for sitting balance with one UE support for at least 5 minutes of a task.   Status On-going           OT Long Term Goals - 09/24/14 1649    OT LONG TERM GOAL #1   Title Pt and wife  will be mod I with upgraded HEP - 10/29/2014   Status On-going   OT LONG TERM GOAL #2   Title Pt will demonstrate ability to be  be supervision with UB bathing w/c level   Status On-going   OT LONG TERM GOAL #3   Title Pt will demonstrate ability to be min a with LB bathing    Status On-going   OT LONG TERM GOAL #4   Title Pt will demonstrate ability to dress UB with supervision at w/c level   Status On-going   OT LONG TERM GOAL #5   Status On-going               Plan - 09/26/14 1530    Clinical Impression Statement Patient progressing with sitting tolerance, sit to squat positioning, and squat pivot transfers   Pt will benefit from skilled therapeutic intervention in order to improve on the following deficits (Retired) Cardiopulmonary status limiting activity;Decreased activity tolerance;Decreased balance;Decreased cognition;Decreased mobility;Decreased knowledge of use of DME;Decreased knowledge of precautions;Decreased endurance;Decreased strength   Rehab Potential Fair   Clinical Impairments Affecting Rehab Potential Pt can benefit from skilled OT to address the above deficits.   OT Frequency 2x / week   OT Duration 8 weeks   OT Treatment/Interventions Self-care/ADL training;DME and/or AE instruction;Energy conservation;Therapeutic exercise;Therapist, nutritional;Therapeutic activities;Balance training;Patient/family education   Plan activity tolerance, transfers, sit to squat - sustain, sitting balance    Consulted and Agree with Plan of Care Patient        Problem List Patient Active Problem List   Diagnosis Date Noted  . Diarrhea 08/14/2014  . Dehydration 10/02/2013  . Fever 10/02/2013  . Altered mental status 10/01/2013  . Encephalopathy, toxic 10/01/2013  . Encephalopathy, metabolic 93/79/0240  . CVA (cerebral vascular accident) 09/28/2013  . FTT (failure to thrive) in adult 09/28/2013  . Acute confusional state 09/28/2013  . Ulcer of sacral region, stage 3 09/26/2013  . S/P BKA (below knee amputation) bilateral 09/08/2013  . ESRD on hemodialysis 05/09/2013  . TIA (transient ischemic attack) 02/20/2013  . Acute blood loss anemia 10/28/2012  . Chronic combined systolic (EF 97%) and grade 2diastolic congestive heart failure 10/28/2012  . Obstipation 10/28/2012  . GI bleed 10/27/2012  . Mass in rectum 10/27/2012  . DM (diabetes mellitus) type I controlled with renal manifestation 09/08/2012  . LVH (left ventricular hypertrophy)-severe concentric 06/13/2012  . Anemia due to chronic illness 06/12/2012  . Hyperlipidemia 01/30/2011  . CHOLELITHIASIS 08/01/2010  . BENIGN PROSTATIC HYPERTROPHY 08/01/2010  . CEREBROVASCULAR ACCIDENT, HX OF 08/06/2009  . Depression 03/18/2009  . Acute combined systolic and diastolic heart failure, NYHA class 2-EF 45% 03/18/2009  . NEPHROLITHIASIS, HX OF 03/18/2009  . Morbid obesity 03/25/2007  . Essential hypertension 03/25/2007  . GERD 03/25/2007  . HEPATITIS C, HX OF 03/25/2007    Mariah Milling, OTR/L 09/26/2014, 4:53 PM  [facility] [address] [place], [place], [address] Phone: [phone]   Fax:  [phone]

## 2014-09-26 NOTE — Therapy (Signed)
[facility] [address] [place], [place], [address] Phone: [phone]   Fax:  [phone]  Physical Therapy Treatment  Patient Details  Name: Oscar Castillo MRN: [id number] Date of Birth: [birth year] Referring Provider:  [name], MD  Encounter Date: 09/26/2014      PT End of Session - 09/26/14 1400    Visit Number 7   Number of Visits 34   Date for PT Re-Evaluation 11/02/14   PT Start Time 1400   PT Stop Time 1445   PT Time Calculation (min) 45 min   Equipment Utilized During Treatment Gait belt   Activity Tolerance Patient tolerated treatment well   Behavior During Therapy [facility] for tasks assessed/performed      Past Medical History  Diagnosis Date  . ESRD on hemodialysis 05/05/2007    ESRD due to DM/HTN. Started dialysis in November 2013.  HD TTS at [facility] on [address].  [name] BACK PAIN, LUMBAR, CHRONIC 08/06/2009  . BENIGN PROSTATIC HYPERTROPHY 08/01/2010  . CEREBROVASCULAR ACCIDENT, HX OF 08/06/2009  . CHOLELITHIASIS 08/01/2010  . CONGESTIVE HEART FAILURE 03/18/2009  . DEPRESSION 03/18/2009  . DIABETES MELLITUS, TYPE II 03/25/2007  . ERECTILE DYSFUNCTION 03/25/2007  . GERD 03/25/2007  . HEPATITIS C, HX OF 03/25/2007  . HYPERTENSION 03/25/2007  . Morbid obesity 03/25/2007  . NEPHROLITHIASIS, HX OF 03/18/2009  . Complication of anesthesia     wife states pt had trouble waking up with his last surgery in Nov., 2014    Past Surgical History  Procedure Laterality Date  . Nephrectomy      partial RR  . Av fistula placement  06/14/2012    Procedure: ARTERIOVENOUS (AV) FISTULA CREATION;  Surgeon: Angelia Mould, MD;  Location: [facility] OR;  Service: Vascular;  Laterality: Left;  Left basilic vein transposition with fistula.  . Tibia im nail insertion Left 09/09/2012    Procedure: INTRAMEDULLARY (IM) NAIL TIBIAL;  Surgeon: Johnny Bridge, MD;  Location: [place];  Service: Orthopedics;  Laterality: Left;  left tibial  nail and open reduction internal fixation left fibula fracture  . Orif fibula fracture Left 09/09/2012    Procedure: OPEN REDUCTION INTERNAL FIXATION (ORIF) FIBULA FRACTURE;  Surgeon: Johnny Bridge, MD;  Location: [place];  Service: Orthopedics;  Laterality: Left;  . Colonoscopy N/A 10/28/2012    Procedure: COLONOSCOPY;  Surgeon: Jeryl Columbia, MD;  Location: [facility] ENDOSCOPY;  Service: Endoscopy;  Laterality: N/A;  . Esophagogastroduodenoscopy N/A 11/02/2012    Procedure: ESOPHAGOGASTRODUODENOSCOPY (EGD);  Surgeon: Cleotis Nipper, MD;  Location: [facility] ENDOSCOPY;  Service: Endoscopy;  Laterality: N/A;  . Colonoscopy N/A 11/02/2012    Procedure: COLONOSCOPY;  Surgeon: Cleotis Nipper, MD;  Location: [facility] ENDOSCOPY;  Service: Endoscopy;  Laterality: N/A;  . Colonoscopy N/A 11/03/2012    Procedure: COLONOSCOPY;  Surgeon: Cleotis Nipper, MD;  Location: [facility] ENDOSCOPY;  Service: Endoscopy;  Laterality: N/A;  . Givens capsule study N/A 11/04/2012    Procedure: GIVENS CAPSULE STUDY;  Surgeon: Cleotis Nipper, MD;  Location: [facility] ENDOSCOPY;  Service: Endoscopy;  Laterality: N/A;  . Enteroscopy N/A 11/08/2012    Procedure: ENTEROSCOPY;  Surgeon: Wonda Horner, MD;  Location: [facility] ENDOSCOPY;  Service: Endoscopy;  Laterality: N/A;  . Amputation Left 05/12/2013    Procedure: AMPUTATION RAY;  Surgeon: Newt Minion, MD;  Location: [place];  Service: Orthopedics;  Laterality: Left;  Left Foot 1st Ray Amputation  . Eye surgery Left  to remove scar tissue  . Amputation Left 06/09/2013    Procedure: AMPUTATION BELOW KNEE;  Surgeon: Newt Minion, MD;  Location: [place];  Service: Orthopedics;  Laterality: Left;  Left Below Knee Amputation and removal proximal screws IM tibial nail  . Hardware removal Left 06/09/2013    Procedure: HARDWARE REMOVAL;  Surgeon: Newt Minion, MD;  Location: [place];  Service: Orthopedics;  Laterality: Left;  Left Below Knee Amputation  and Removal proximal screws IM tibial nail  . Amputation Right  09/08/2013    Procedure: AMPUTATION BELOW KNEE;  Surgeon: Newt Minion, MD;  Location: [place];  Service: Orthopedics;  Laterality: Right;  Right Below Knee Amputation  . Amputation Right 10/11/2013    Procedure: AMPUTATION BELOW KNEE;  Surgeon: Newt Minion, MD;  Location: [place];  Service: Orthopedics;  Laterality: Right;  Right Below Knee Amputation Revision    There were no vitals taken for this visit.  Visit Diagnosis:  Impaired functional mobility and activity tolerance  Generalized weakness  Balance problems  Status post bilateral below knee amputation      Subjective Assessment - 09/26/14 1410    Symptoms Tired today. He is starting to do more things. Wife reports turning in bed is still hard.   Currently in Pain? Yes   Pain Score 9    Pain Frequency Constant     Therapeutic Exercise: NuStep Level 2 with 4 extremities 5 minutes Prosthetic Training: Squat-pivot transfer to level surface with minA. Rolling in bed with cover with cues /SBA. Sitting on 24" stool without UE support with min guard: trunk rotation right & left, forward lean with recovery, back lean with recovery. Sit to stand in parallel bars with moderate assist. Patient ambulated 3' in parallel bars with PT blocking knee flexion with maximal assist.                       PT Education - 09/26/14 1400    Education provided Yes   Education Details using pillows to tent top sheet in bed to decrease restance to improve rolling   Person(s) Educated Patient;Spouse   Methods Explanation   Comprehension Verbalized understanding          PT Short Term Goals - 09/26/14 1400    PT SHORT TERM GOAL #1   Title donnes prostheses with minimal assist (Target Date: 10/02/14)   Time 1   Period Months   Status New   PT SHORT TERM GOAL #2   Title wife & patient verbalize adjusting wear schedule to maximize functional potential. (Target Date: 10/02/14)   Time 1   Period Months   Status New   PT SHORT  TERM GOAL #3   Title performs squat-pivot transfer w/c to level mat with minimal assist. (Target Date: 10/02/14)   Baseline MET 09/26/14   Time 1   Period Months   Status Achieved   PT SHORT TERM GOAL #4   Title Sit to/from stand w/c to parallel bars with moderate assist (Target Date: 10/02/14)   Baseline MET 09/26/14   Time 1   Period Months   Status Achieved   PT SHORT TERM GOAL #5   Title patient & wife demonstrate initial HEP correctly. (Target Date: 10/02/14)   Baseline MET 09/21/14   Time 1   Period Months   Status Achieved           PT Long Term Goals - 09/03/14 1015    PT LONG  TERM GOAL #1   Title Patient and wife demonstrate proper prosthetic care. (Target Date: 01/02/15)   Time 4   Period Months   Status New   PT LONG TERM GOAL #2   Title tolerates wear of prostheses >90% of awake hours except at dialysis (Target Date: 01/02/15)   Time 4   Period Months   Status New   PT LONG TERM GOAL #3   Title stand pivot transfers with rolling walker & prostheses modified independent.  (Target Date: 01/02/15)   Time 4   Period Months   Status New   PT LONG TERM GOAL #4   Title manages clothes, reaches 5" and in upper /lower cabinets in standing with UE support modified independent.  (Target Date: 01/02/15)   Time 4   Period Months   Status New   PT LONG TERM GOAL #5   Title ambulate 24' with rolling walker & prostheses with minimal assist.  (Target Date: 01/02/15)   Time 4   Period Months   Status New               Plan - 09/26/14 1400    Clinical Impression Statement Patient appears depressed today which decreased activity tolerance.    Pt will benefit from skilled therapeutic intervention in order to improve on the following deficits Abnormal gait;Decreased activity tolerance;Decreased balance;Decreased endurance;Decreased knowledge of use of DME;Decreased mobility;Decreased range of motion;Decreased strength;Difficulty walking;Other (comment)  prosthetic dependency   Rehab  Potential Good   Clinical Impairments Affecting Rehab Potential multiple medical issues over last year causing decondtioning   PT Frequency 2x / week   PT Duration Other (comment)  4 months   PT Treatment/Interventions ADLs/Self Care Home Management;Gait training;Stair training;DME Instruction;Functional mobility training;Therapeutic activities;Therapeutic exercise;Balance training;Neuromuscular re-education;Patient/family education;Manual techniques;Other (comment)  prosthetic training   PT Next Visit Plan Assess STGs, bed mobility training, strengthening and flexibility exercises on mat both with and without bil prostheses, standing in parallel bars   PT Home Exercise Plan review seated HEP   Consulted and Agree with Plan of Care Patient;Family member/caregiver   Family Member Consulted wife        Problem List Patient Active Problem List   Diagnosis Date Noted  . Diarrhea 08/14/2014  . Dehydration 10/02/2013  . Fever 10/02/2013  . Altered mental status 10/01/2013  . Encephalopathy, toxic 10/01/2013  . Encephalopathy, metabolic 50/53/9767  . CVA (cerebral vascular accident) 09/28/2013  . FTT (failure to thrive) in adult 09/28/2013  . Acute confusional state 09/28/2013  . Ulcer of sacral region, stage 3 09/26/2013  . S/P BKA (below knee amputation) bilateral 09/08/2013  . ESRD on hemodialysis 05/09/2013  . TIA (transient ischemic attack) 02/20/2013  . Acute blood loss anemia 10/28/2012  . Chronic combined systolic (EF 34%) and grade 2diastolic congestive heart failure 10/28/2012  . Obstipation 10/28/2012  . GI bleed 10/27/2012  . Mass in rectum 10/27/2012  . DM (diabetes mellitus) type I controlled with renal manifestation 09/08/2012  . LVH (left ventricular hypertrophy)-severe concentric 06/13/2012  . Anemia due to chronic illness 06/12/2012  . Hyperlipidemia 01/30/2011  . CHOLELITHIASIS 08/01/2010  . BENIGN PROSTATIC HYPERTROPHY 08/01/2010  . CEREBROVASCULAR ACCIDENT, HX  OF 08/06/2009  . Depression 03/18/2009  . Acute combined systolic and diastolic heart failure, NYHA class 2-EF 45% 03/18/2009  . NEPHROLITHIASIS, HX OF 03/18/2009  . Morbid obesity 03/25/2007  . Essential hypertension 03/25/2007  . GERD 03/25/2007  . HEPATITIS C, HX OF 03/25/2007    Pegeen Stiger PT, DPT 09/26/2014, 8:33  PM  [facility] [address] [place], [place], [address] Phone: [phone]   Fax:  [phone]

## 2014-09-27 DIAGNOSIS — N186 End stage renal disease: Secondary | ICD-10-CM | POA: Diagnosis not present

## 2014-09-27 DIAGNOSIS — D509 Iron deficiency anemia, unspecified: Secondary | ICD-10-CM | POA: Diagnosis not present

## 2014-09-27 DIAGNOSIS — N2581 Secondary hyperparathyroidism of renal origin: Secondary | ICD-10-CM | POA: Diagnosis not present

## 2014-09-27 DIAGNOSIS — E1122 Type 2 diabetes mellitus with diabetic chronic kidney disease: Secondary | ICD-10-CM | POA: Diagnosis not present

## 2014-09-29 DIAGNOSIS — E1122 Type 2 diabetes mellitus with diabetic chronic kidney disease: Secondary | ICD-10-CM | POA: Diagnosis not present

## 2014-09-29 DIAGNOSIS — N186 End stage renal disease: Secondary | ICD-10-CM | POA: Diagnosis not present

## 2014-09-29 DIAGNOSIS — N2581 Secondary hyperparathyroidism of renal origin: Secondary | ICD-10-CM | POA: Diagnosis not present

## 2014-09-29 DIAGNOSIS — D509 Iron deficiency anemia, unspecified: Secondary | ICD-10-CM | POA: Diagnosis not present

## 2014-10-01 ENCOUNTER — Encounter: Payer: Self-pay | Admitting: Occupational Therapy

## 2014-10-01 ENCOUNTER — Encounter: Payer: Self-pay | Admitting: Physical Therapy

## 2014-10-01 ENCOUNTER — Ambulatory Visit: Payer: Medicare Other | Admitting: Physical Therapy

## 2014-10-01 ENCOUNTER — Ambulatory Visit: Payer: Medicare Other | Admitting: Occupational Therapy

## 2014-10-01 DIAGNOSIS — R5381 Other malaise: Secondary | ICD-10-CM | POA: Diagnosis not present

## 2014-10-01 DIAGNOSIS — Z7409 Other reduced mobility: Secondary | ICD-10-CM

## 2014-10-01 DIAGNOSIS — Z89511 Acquired absence of right leg below knee: Secondary | ICD-10-CM | POA: Diagnosis not present

## 2014-10-01 DIAGNOSIS — Z89512 Acquired absence of left leg below knee: Secondary | ICD-10-CM

## 2014-10-01 DIAGNOSIS — R531 Weakness: Secondary | ICD-10-CM

## 2014-10-01 DIAGNOSIS — R2689 Other abnormalities of gait and mobility: Secondary | ICD-10-CM

## 2014-10-01 DIAGNOSIS — R4189 Other symptoms and signs involving cognitive functions and awareness: Secondary | ICD-10-CM | POA: Diagnosis not present

## 2014-10-01 NOTE — Therapy (Signed)
[facility] [address] [place], [place], [address] Phone: [phone]   Fax:  [phone]  Physical Therapy Treatment  Patient Details  Name: Oscar Castillo MRN: [id number] Date of Birth: 11-22-1950 Referring Provider:  [name], MD  Encounter Date: 10/01/2014      PT End of Session - 10/01/14 1403    Visit Number 8   Number of Visits 34   Date for PT Re-Evaluation 11/02/14   PT Start Time 1400   PT Stop Time 1445   PT Time Calculation (min) 45 min   Equipment Utilized During Treatment Gait belt   Activity Tolerance Patient tolerated treatment well   Behavior During Therapy [facility] for tasks assessed/performed      Past Medical History  Diagnosis Date  . ESRD on hemodialysis 05/05/2007    ESRD due to DM/HTN. Started dialysis in November 2013.  HD TTS at [facility] on [place].  [name] BACK PAIN, LUMBAR, CHRONIC 08/06/2009  . BENIGN PROSTATIC HYPERTROPHY 08/01/2010  . CEREBROVASCULAR ACCIDENT, HX OF 08/06/2009  . CHOLELITHIASIS 08/01/2010  . CONGESTIVE HEART FAILURE 03/18/2009  . DEPRESSION 03/18/2009  . DIABETES MELLITUS, TYPE II 03/25/2007  . ERECTILE DYSFUNCTION 03/25/2007  . GERD 03/25/2007  . HEPATITIS C, HX OF 03/25/2007  . HYPERTENSION 03/25/2007  . Morbid obesity 03/25/2007  . NEPHROLITHIASIS, HX OF 03/18/2009  . Complication of anesthesia     wife states pt had trouble waking up with his last surgery in Nov., 2014    Past Surgical History  Procedure Laterality Date  . Nephrectomy      partial RR  . Av fistula placement  06/14/2012    Procedure: ARTERIOVENOUS (AV) FISTULA CREATION;  Surgeon: Angelia Mould, MD;  Location: [facility] OR;  Service: Vascular;  Laterality: Left;  Left basilic vein transposition with fistula.  . Tibia im nail insertion Left 09/09/2012    Procedure: INTRAMEDULLARY (IM) NAIL TIBIAL;  Surgeon: Johnny Bridge, MD;  Location: [place];  Service: Orthopedics;  Laterality: Left;  left tibial  nail and open reduction internal fixation left fibula fracture  . Orif fibula fracture Left 09/09/2012    Procedure: OPEN REDUCTION INTERNAL FIXATION (ORIF) FIBULA FRACTURE;  Surgeon: Johnny Bridge, MD;  Location: [place];  Service: Orthopedics;  Laterality: Left;  . Colonoscopy N/A 10/28/2012    Procedure: COLONOSCOPY;  Surgeon: Jeryl Columbia, MD;  Location: [facility] ENDOSCOPY;  Service: Endoscopy;  Laterality: N/A;  . Esophagogastroduodenoscopy N/A 11/02/2012    Procedure: ESOPHAGOGASTRODUODENOSCOPY (EGD);  Surgeon: Cleotis Nipper, MD;  Location: [facility] ENDOSCOPY;  Service: Endoscopy;  Laterality: N/A;  . Colonoscopy N/A 11/02/2012    Procedure: COLONOSCOPY;  Surgeon: Cleotis Nipper, MD;  Location: [facility] ENDOSCOPY;  Service: Endoscopy;  Laterality: N/A;  . Colonoscopy N/A 11/03/2012    Procedure: COLONOSCOPY;  Surgeon: Cleotis Nipper, MD;  Location: [facility] ENDOSCOPY;  Service: Endoscopy;  Laterality: N/A;  . Givens capsule study N/A 11/04/2012    Procedure: GIVENS CAPSULE STUDY;  Surgeon: Cleotis Nipper, MD;  Location: [facility] ENDOSCOPY;  Service: Endoscopy;  Laterality: N/A;  . Enteroscopy N/A 11/08/2012    Procedure: ENTEROSCOPY;  Surgeon: Wonda Horner, MD;  Location: [facility] ENDOSCOPY;  Service: Endoscopy;  Laterality: N/A;  . Amputation Left 05/12/2013    Procedure: AMPUTATION RAY;  Surgeon: Newt Minion, MD;  Location: [place];  Service: Orthopedics;  Laterality: Left;  Left Foot 1st Ray Amputation  . Eye surgery Left  to remove scar tissue  . Amputation Left 06/09/2013    Procedure: AMPUTATION BELOW KNEE;  Surgeon: Newt Minion, MD;  Location: [place];  Service: Orthopedics;  Laterality: Left;  Left Below Knee Amputation and removal proximal screws IM tibial nail  . Hardware removal Left 06/09/2013    Procedure: HARDWARE REMOVAL;  Surgeon: Newt Minion, MD;  Location: [place];  Service: Orthopedics;  Laterality: Left;  Left Below Knee Amputation  and Removal proximal screws IM tibial nail  . Amputation Right  09/08/2013    Procedure: AMPUTATION BELOW KNEE;  Surgeon: Newt Minion, MD;  Location: [place];  Service: Orthopedics;  Laterality: Right;  Right Below Knee Amputation  . Amputation Right 10/11/2013    Procedure: AMPUTATION BELOW KNEE;  Surgeon: Newt Minion, MD;  Location: [place];  Service: Orthopedics;  Laterality: Right;  Right Below Knee Amputation Revision    There were no vitals taken for this visit.  Visit Diagnosis:  Impaired functional mobility and activity tolerance  Balance problems  Status post bilateral below knee amputation  Generalized weakness      Subjective Assessment - 10/01/14 1101    Symptoms Wife reports she can see a difference and he is doing more for himself.  But patient reports he doesn't see much change.   Currently in Pain? Yes   Pain Score 8    Pain Location Back   Pain Descriptors / Indicators Aching   Pain Type Chronic pain   Pain Onset More than a month ago   Pain Frequency Constant     Therapeutic Exercises: Supine on mat: single & double knee to chest, trunk rotation, Thomas hip extension, alternate LE press into chair back with hip / knee extended, hamstring stretch, LE traction.  Seated at edge of mat: forward flexion lean, back extension in forward lean position, trunk rotation NuStep level 2 with 4 extremities X 5 minutes with cues.  Squat-pivot transfers with minA.  Prosthetic Training: Patient's wife reports that he can donne prostheses with minimal assist but she helps him a lot due to emotional state.  Patient & wife report understanding of wear & function cororelation. He reports now wearing to dialysis with no issues with weigh-in or out.                      PT Education - 10/01/14 1401    Education provided Yes   Education Details back pain and low level stretches to decrease pain.   Person(s) Educated Patient;Spouse   Methods Explanation;Demonstration   Comprehension Verbalized understanding;Need further  instruction          PT Short Term Goals - 10/01/14 1415    PT SHORT TERM GOAL #1   Title donnes prostheses with minimal assist (Target Date: 10/02/14)   Time 1   Period Months   Status Partially Met   PT SHORT TERM GOAL #2   Title wife & patient verbalize adjusting wear schedule to maximize functional potential. (Target Date: 10/02/14)   Time 1   Period Months   Status Achieved   PT SHORT TERM GOAL #3   Title performs squat-pivot transfer w/c to level mat with minimal assist. (Target Date: 10/02/14)   Baseline MET 09/26/14   Time 1   Period Months   Status Achieved   PT SHORT TERM GOAL #4   Title Sit to/from stand w/c to parallel bars with moderate assist (Target Date: 10/02/14)   Baseline MET 09/26/14  Time 1   Period Months   Status Achieved   PT SHORT TERM GOAL #5   Title patient & wife demonstrate initial HEP correctly. (Target Date: 10/02/14)   Baseline MET 09/21/14   Time 1   Period Months   Status Achieved   Additional Short Term Goals   Additional Short Term Goals Yes   PT SHORT TERM GOAL #6   Title reports wear of prostheses daily >80% of awake hours. (Target Date: 11/02/14)   Time 30   Period Days   Status New   PT SHORT TERM GOAL #7   Title Squat-pivot transfer with supervision. (Target Date: 11/02/14)   Time 30   Period Days   Status New   PT SHORT TERM GOAL #8   Title Sit to / from stand w/c to RW with moderate assist. (Target Date: 11/02/14)   Time 30   Period Days   Status New           PT Long Term Goals - 09/03/14 1015    PT LONG TERM GOAL #1   Title Patient and wife demonstrate proper prosthetic care. (Target Date: 01/02/15)   Time 4   Period Months   Status New   PT LONG TERM GOAL #2   Title tolerates wear of prostheses >90% of awake hours except at dialysis (Target Date: 01/02/15)   Time 4   Period Months   Status New   PT LONG TERM GOAL #3   Title stand pivot transfers with rolling walker & prostheses modified independent.  (Target Date: 01/02/15)    Time 4   Period Months   Status New   PT LONG TERM GOAL #4   Title manages clothes, reaches 5" and in upper /lower cabinets in standing with UE support modified independent.  (Target Date: 01/02/15)   Time 4   Period Months   Status New   PT LONG TERM GOAL #5   Title ambulate 4' with rolling walker & prostheses with minimal assist.  (Target Date: 01/02/15)   Time 4   Period Months   Status New               Plan - 10/01/14 1403    Clinical Impression Statement Patient's back pain decreased from 8/10 to 5/10 with stretches & NuStep activity. Patient is still struggling with depression that limits activity tolerance.   Pt will benefit from skilled therapeutic intervention in order to improve on the following deficits Abnormal gait;Decreased activity tolerance;Decreased balance;Decreased endurance;Decreased knowledge of use of DME;Decreased mobility;Decreased range of motion;Decreased strength;Difficulty walking;Other (comment)  prosthetic dependency   Rehab Potential Good   Clinical Impairments Affecting Rehab Potential multiple medical issues over last year causing decondtioning   PT Frequency 2x / week   PT Duration Other (comment)  4 months   PT Treatment/Interventions ADLs/Self Care Home Management;Gait training;Stair training;DME Instruction;Functional mobility training;Therapeutic activities;Therapeutic exercise;Balance training;Neuromuscular re-education;Patient/family education;Manual techniques;Other (comment)  prosthetic training   PT Next Visit Plan trengthening and flexibility exercises on mat both with and without bil prostheses, standing with rolling walker   Consulted and Agree with Plan of Care Patient;Family member/caregiver   Family Member Consulted wife        Problem List Patient Active Problem List   Diagnosis Date Noted  . Diarrhea 08/14/2014  . Dehydration 10/02/2013  . Fever 10/02/2013  . Altered mental status 10/01/2013  . Encephalopathy, toxic  10/01/2013  . Encephalopathy, metabolic 63/78/5885  . CVA (cerebral vascular accident) 09/28/2013  . FTT (  failure to thrive) in adult 09/28/2013  . Acute confusional state 09/28/2013  . Ulcer of sacral region, stage 3 09/26/2013  . S/P BKA (below knee amputation) bilateral 09/08/2013  . ESRD on hemodialysis 05/09/2013  . TIA (transient ischemic attack) 02/20/2013  . Acute blood loss anemia 10/28/2012  . Chronic combined systolic (EF 00%) and grade 2diastolic congestive heart failure 10/28/2012  . Obstipation 10/28/2012  . GI bleed 10/27/2012  . Mass in rectum 10/27/2012  . DM (diabetes mellitus) type I controlled with renal manifestation 09/08/2012  . LVH (left ventricular hypertrophy)-severe concentric 06/13/2012  . Anemia due to chronic illness 06/12/2012  . Hyperlipidemia 01/30/2011  . CHOLELITHIASIS 08/01/2010  . BENIGN PROSTATIC HYPERTROPHY 08/01/2010  . CEREBROVASCULAR ACCIDENT, HX OF 08/06/2009  . Depression 03/18/2009  . Acute combined systolic and diastolic heart failure, NYHA class 2-EF 45% 03/18/2009  . NEPHROLITHIASIS, HX OF 03/18/2009  . Morbid obesity 03/25/2007  . Essential hypertension 03/25/2007  . GERD 03/25/2007  . HEPATITIS C, HX OF 03/25/2007    Jamey Reas PT,DPT 10/01/2014, 2:20 PM  [place] [address] [place], [place], [address] Phone: [phone]   Fax:  [phone]

## 2014-10-01 NOTE — Therapy (Signed)
[facility] [address] [place], [place], [address] Phone: [phone]   Fax:  [phone]  Occupational Therapy Treatment  Patient Details  Name: Oscar Castillo MRN: [id number] Date of Birth: 14-Jun-1951 Referring Provider:  [name], MD  Encounter Date: 10/01/2014      OT End of Session - 10/01/14 1217    Visit Number 8   Number of Visits 16   Date for OT Re-Evaluation 10/29/14   Authorization Type medicare needs G code   OT Start Time 1025  pt arrived late   OT Stop Time 1058   OT Time Calculation (min) 33 min   Activity Tolerance Patient limited by fatigue      Past Medical History  Diagnosis Date  . ESRD on hemodialysis 05/05/2007    ESRD due to DM/HTN. Started dialysis in November 2013.  HD TTS at [facility] on [place].  [name] BACK PAIN, LUMBAR, CHRONIC 08/06/2009  . BENIGN PROSTATIC HYPERTROPHY 08/01/2010  . CEREBROVASCULAR ACCIDENT, HX OF 08/06/2009  . CHOLELITHIASIS 08/01/2010  . CONGESTIVE HEART FAILURE 03/18/2009  . DEPRESSION 03/18/2009  . DIABETES MELLITUS, TYPE II 03/25/2007  . ERECTILE DYSFUNCTION 03/25/2007  . GERD 03/25/2007  . HEPATITIS C, HX OF 03/25/2007  . HYPERTENSION 03/25/2007  . Morbid obesity 03/25/2007  . NEPHROLITHIASIS, HX OF 03/18/2009  . Complication of anesthesia     wife states pt had trouble waking up with his last surgery in Nov., 2014    Past Surgical History  Procedure Laterality Date  . Nephrectomy      partial RR  . Av fistula placement  06/14/2012    Procedure: ARTERIOVENOUS (AV) FISTULA CREATION;  Surgeon: Angelia Mould, MD;  Location: [facility] OR;  Service: Vascular;  Laterality: Left;  Left basilic vein transposition with fistula.  . Tibia im nail insertion Left 09/09/2012    Procedure: INTRAMEDULLARY (IM) NAIL TIBIAL;  Surgeon: Johnny Bridge, MD;  Location: [place];  Service: Orthopedics;  Laterality: Left;  left tibial nail and open reduction internal fixation left  fibula fracture  . Orif fibula fracture Left 09/09/2012    Procedure: OPEN REDUCTION INTERNAL FIXATION (ORIF) FIBULA FRACTURE;  Surgeon: Johnny Bridge, MD;  Location: [place];  Service: Orthopedics;  Laterality: Left;  . Colonoscopy N/A 10/28/2012    Procedure: COLONOSCOPY;  Surgeon: Jeryl Columbia, MD;  Location: [facility] ENDOSCOPY;  Service: Endoscopy;  Laterality: N/A;  . Esophagogastroduodenoscopy N/A 11/02/2012    Procedure: ESOPHAGOGASTRODUODENOSCOPY (EGD);  Surgeon: Cleotis Nipper, MD;  Location: [facility] ENDOSCOPY;  Service: Endoscopy;  Laterality: N/A;  . Colonoscopy N/A 11/02/2012    Procedure: COLONOSCOPY;  Surgeon: Cleotis Nipper, MD;  Location: [facility] ENDOSCOPY;  Service: Endoscopy;  Laterality: N/A;  . Colonoscopy N/A 11/03/2012    Procedure: COLONOSCOPY;  Surgeon: Cleotis Nipper, MD;  Location: [facility] ENDOSCOPY;  Service: Endoscopy;  Laterality: N/A;  . Givens capsule study N/A 11/04/2012    Procedure: GIVENS CAPSULE STUDY;  Surgeon: Cleotis Nipper, MD;  Location: [facility] ENDOSCOPY;  Service: Endoscopy;  Laterality: N/A;  . Enteroscopy N/A 11/08/2012    Procedure: ENTEROSCOPY;  Surgeon: Wonda Horner, MD;  Location: [facility] ENDOSCOPY;  Service: Endoscopy;  Laterality: N/A;  . Amputation Left 05/12/2013    Procedure: AMPUTATION RAY;  Surgeon: Newt Minion, MD;  Location: [place];  Service: Orthopedics;  Laterality: Left;  Left Foot 1st Ray Amputation  . Eye surgery Left     to remove scar tissue  .  Amputation Left 06/09/2013    Procedure: AMPUTATION BELOW KNEE;  Surgeon: Newt Minion, MD;  Location: [place];  Service: Orthopedics;  Laterality: Left;  Left Below Knee Amputation and removal proximal screws IM tibial nail  . Hardware removal Left 06/09/2013    Procedure: HARDWARE REMOVAL;  Surgeon: Newt Minion, MD;  Location: [place];  Service: Orthopedics;  Laterality: Left;  Left Below Knee Amputation  and Removal proximal screws IM tibial nail  . Amputation Right 09/08/2013    Procedure: AMPUTATION BELOW KNEE;   Surgeon: Newt Minion, MD;  Location: [place];  Service: Orthopedics;  Laterality: Right;  Right Below Knee Amputation  . Amputation Right 10/11/2013    Procedure: AMPUTATION BELOW KNEE;  Surgeon: Newt Minion, MD;  Location: [place];  Service: Orthopedics;  Laterality: Right;  Right Below Knee Amputation Revision    There were no vitals taken for this visit.  Visit Diagnosis:  Impaired functional mobility and activity tolerance  Generalized weakness      Subjective Assessment - 10/01/14 1027    Symptoms I am tired   Pertinent History see epic snapshot   Currently in Pain? Yes   Pain Score 7    Pain Location Back   Pain Orientation Right   Pain Descriptors / Indicators Aching   Pain Type Acute pain   Pain Onset 1 to 4 weeks ago   Pain Frequency Constant   Aggravating Factors  movement   Pain Relieving Factors I don't know   Multiple Pain Sites Yes   Pain Score 8   Pain Type Acute pain   Pain Location Arm   Pain Orientation Right;Left   Pain Descriptors / Indicators Aching   Pain Frequency Constant                 OT Treatments/Exercises (OP) - 10/01/14 0001    ADLs   Functional Mobility Contact guard and vc's only for wheelchair to mat transfer today. Wife reports he is now min a with 3 in1 commode transfers and no longer using brief at home. Pt also with increase participation in bathing and dressing.   Exercises   Exercises Shoulder   Shoulder Exercises: Seated   Other Seated Exercises Pt able to complete 10reps x2 of each exercise with 2 pound weight sitting unsupported EOM.(shoulder flexion/extension, chest presses in sitting and bicep curls).  Pt also able to use push up blocks for pushing up and hold body weight off mat for count of three (8 reps) with rest breaks.  Pt able to transfer wheechair to mat with contact guard only today..   Other Seated Exercises Pt with 60 pounds of grip strencgth R hand and 45 pounds with L hand - significant improvement                   OT Short Term Goals - 10/01/14 1221    OT SHORT TERM GOAL #1   Title Pt and wife will be mod I with HEP - 10/01/2014   Status Achieved   OT SHORT TERM GOAL #2   Title Pt will demonstrate increased grip strength bilaterally combined by at least 5 pounds (baseline = R= 38 pounds, L= 35 pounds)  R= 60 pounds  L= 45   Status Achieved   OT SHORT TERM GOAL #3   Title Pt will demonstrate ability to  be min a with UB bathing at either bed or w/c level   Status Achieved   OT SHORT TERM  GOAL #4   Title Pt will demonstrate ability to dress UB with mod a at either bed or w/c level  wheelchair level   Status On-going   OT SHORT TERM GOAL #5   Title Pt will be supervision for sitting balance with one UE support for at least 5 minutes of a task.   Status Achieved           OT Long Term Goals - 10/01/14 1222    OT LONG TERM GOAL #1   Title Pt and wife will be mod I with upgraded HEP - 10/29/2014   Status On-going   OT LONG TERM GOAL #2   Title Pt will demonstrate ability to be  be supervision with UB bathing w/c level   Status On-going   OT LONG TERM GOAL #3   Title Pt will demonstrate ability to be min a with LB bathing    Status On-going   OT LONG TERM GOAL #4   Title Pt will demonstrate ability to dress UB with supervision at w/c level   Status Achieved   OT LONG TERM GOAL #5   Status On-going               Plan - 10/01/14 1219    Clinical Impression Statement Pt making significant progress toward goals;  pt still limited by fatigue and muscle soreness due to being more active than he has been for considerable period of time.   Pt will benefit from skilled therapeutic intervention in order to improve on the following deficits (Retired) Cardiopulmonary status limiting activity;Decreased activity tolerance;Decreased balance;Decreased cognition;Decreased mobility;Decreased knowledge of use of DME;Decreased knowledge of precautions;Decreased  endurance;Decreased strength   Rehab Potential Fair   Clinical Impairments Affecting Rehab Potential Pt can benefit from skilled OT to address the above deficits.   OT Frequency 2x / week   OT Duration 8 weeks   OT Treatment/Interventions Self-care/ADL training;DME and/or AE instruction;Energy conservation;Therapeutic exercise;Therapist, nutritional;Therapeutic activities;Balance training;Patient/family education   Plan increase activity tolerance, increase UE strength, transfers   Consulted and Agree with Plan of Care Patient   Family Member Consulted wife        Problem List Patient Active Problem List   Diagnosis Date Noted  . Diarrhea 08/14/2014  . Dehydration 10/02/2013  . Fever 10/02/2013  . Altered mental status 10/01/2013  . Encephalopathy, toxic 10/01/2013  . Encephalopathy, metabolic 36/14/4315  . CVA (cerebral vascular accident) 09/28/2013  . FTT (failure to thrive) in adult 09/28/2013  . Acute confusional state 09/28/2013  . Ulcer of sacral region, stage 3 09/26/2013  . S/P BKA (below knee amputation) bilateral 09/08/2013  . ESRD on hemodialysis 05/09/2013  . TIA (transient ischemic attack) 02/20/2013  . Acute blood loss anemia 10/28/2012  . Chronic combined systolic (EF 40%) and grade 2diastolic congestive heart failure 10/28/2012  . Obstipation 10/28/2012  . GI bleed 10/27/2012  . Mass in rectum 10/27/2012  . DM (diabetes mellitus) type I controlled with renal manifestation 09/08/2012  . LVH (left ventricular hypertrophy)-severe concentric 06/13/2012  . Anemia due to chronic illness 06/12/2012  . Hyperlipidemia 01/30/2011  . CHOLELITHIASIS 08/01/2010  . BENIGN PROSTATIC HYPERTROPHY 08/01/2010  . CEREBROVASCULAR ACCIDENT, HX OF 08/06/2009  . Depression 03/18/2009  . Acute combined systolic and diastolic heart failure, NYHA class 2-EF 45% 03/18/2009  . NEPHROLITHIASIS, HX OF 03/18/2009  . Morbid obesity 03/25/2007  . Essential hypertension 03/25/2007   . GERD 03/25/2007  . HEPATITIS C, HX OF 03/25/2007    Forde Radon  Sheliah Mends, OTR/L 10/01/2014, 12:23 PM  [place] [address] [place], [place], [address] Phone: [phone]   Fax:  [phone]

## 2014-10-02 DIAGNOSIS — N186 End stage renal disease: Secondary | ICD-10-CM | POA: Diagnosis not present

## 2014-10-02 DIAGNOSIS — E1122 Type 2 diabetes mellitus with diabetic chronic kidney disease: Secondary | ICD-10-CM | POA: Diagnosis not present

## 2014-10-02 DIAGNOSIS — D509 Iron deficiency anemia, unspecified: Secondary | ICD-10-CM | POA: Diagnosis not present

## 2014-10-02 DIAGNOSIS — N2581 Secondary hyperparathyroidism of renal origin: Secondary | ICD-10-CM | POA: Diagnosis not present

## 2014-10-04 ENCOUNTER — Telehealth: Payer: Self-pay | Admitting: Internal Medicine

## 2014-10-04 DIAGNOSIS — R197 Diarrhea, unspecified: Secondary | ICD-10-CM

## 2014-10-04 DIAGNOSIS — D509 Iron deficiency anemia, unspecified: Secondary | ICD-10-CM | POA: Diagnosis not present

## 2014-10-04 DIAGNOSIS — N186 End stage renal disease: Secondary | ICD-10-CM | POA: Diagnosis not present

## 2014-10-04 DIAGNOSIS — E1122 Type 2 diabetes mellitus with diabetic chronic kidney disease: Secondary | ICD-10-CM | POA: Diagnosis not present

## 2014-10-04 DIAGNOSIS — N2581 Secondary hyperparathyroidism of renal origin: Secondary | ICD-10-CM | POA: Diagnosis not present

## 2014-10-04 NOTE — Telephone Encounter (Signed)
Wife states patient has been having sever diarrhea since December.  States patient has seen Dr. [name] in regards.  They are requesting a referral to GI.

## 2014-10-04 NOTE — Telephone Encounter (Signed)
Ok, referral done 

## 2014-10-05 ENCOUNTER — Encounter: Payer: Self-pay | Admitting: Physical Therapy

## 2014-10-05 ENCOUNTER — Ambulatory Visit: Payer: Medicare Other | Admitting: Occupational Therapy

## 2014-10-05 ENCOUNTER — Ambulatory Visit: Payer: Medicare Other | Admitting: Physical Therapy

## 2014-10-05 DIAGNOSIS — Z89511 Acquired absence of right leg below knee: Secondary | ICD-10-CM | POA: Diagnosis not present

## 2014-10-05 DIAGNOSIS — R531 Weakness: Secondary | ICD-10-CM

## 2014-10-05 DIAGNOSIS — R5381 Other malaise: Secondary | ICD-10-CM

## 2014-10-05 DIAGNOSIS — Z7409 Other reduced mobility: Secondary | ICD-10-CM | POA: Diagnosis not present

## 2014-10-05 DIAGNOSIS — R4189 Other symptoms and signs involving cognitive functions and awareness: Secondary | ICD-10-CM | POA: Diagnosis not present

## 2014-10-05 DIAGNOSIS — Z89512 Acquired absence of left leg below knee: Secondary | ICD-10-CM | POA: Diagnosis not present

## 2014-10-05 NOTE — Therapy (Signed)
[facility] [address] [place], [place], [address] Phone: [phone]   Fax:  [phone]  Physical Therapy Treatment  Patient Details  Name: Oscar Castillo MRN: [id number] Date of Birth: 03/27/1951 Referring Provider:  [name], MD  Encounter Date: 10/05/2014      PT End of Session - 10/05/14 1447    Visit Number 9   Number of Visits 34   Date for PT Re-Evaluation 11/02/14   PT Start Time 8250   PT Stop Time 1527   PT Time Calculation (min) 42 min   Equipment Utilized During Treatment Gait belt   Activity Tolerance Patient tolerated treatment well   Behavior During Therapy [facility] for tasks assessed/performed      Past Medical History  Diagnosis Date  . ESRD on hemodialysis 05/05/2007    ESRD due to DM/HTN. Started dialysis in November 2013.  HD TTS at [facility] on [place].  [name] BACK PAIN, LUMBAR, CHRONIC 08/06/2009  . BENIGN PROSTATIC HYPERTROPHY 08/01/2010  . CEREBROVASCULAR ACCIDENT, HX OF 08/06/2009  . CHOLELITHIASIS 08/01/2010  . CONGESTIVE HEART FAILURE 03/18/2009  . DEPRESSION 03/18/2009  . DIABETES MELLITUS, TYPE II 03/25/2007  . ERECTILE DYSFUNCTION 03/25/2007  . GERD 03/25/2007  . HEPATITIS C, HX OF 03/25/2007  . HYPERTENSION 03/25/2007  . Morbid obesity 03/25/2007  . NEPHROLITHIASIS, HX OF 03/18/2009  . Complication of anesthesia     wife states pt had trouble waking up with his last surgery in Nov., 2014    Past Surgical History  Procedure Laterality Date  . Nephrectomy      partial RR  . Av fistula placement  06/14/2012    Procedure: ARTERIOVENOUS (AV) FISTULA CREATION;  Surgeon: Angelia Mould, MD;  Location: [facility] OR;  Service: Vascular;  Laterality: Left;  Left basilic vein transposition with fistula.  . Tibia im nail insertion Left 09/09/2012    Procedure: INTRAMEDULLARY (IM) NAIL TIBIAL;  Surgeon: Johnny Bridge, MD;  Location: [place];  Service: Orthopedics;  Laterality: Left;  left tibial  nail and open reduction internal fixation left fibula fracture  . Orif fibula fracture Left 09/09/2012    Procedure: OPEN REDUCTION INTERNAL FIXATION (ORIF) FIBULA FRACTURE;  Surgeon: Johnny Bridge, MD;  Location: [place];  Service: Orthopedics;  Laterality: Left;  . Colonoscopy N/A 10/28/2012    Procedure: COLONOSCOPY;  Surgeon: Jeryl Columbia, MD;  Location: [facility] ENDOSCOPY;  Service: Endoscopy;  Laterality: N/A;  . Esophagogastroduodenoscopy N/A 11/02/2012    Procedure: ESOPHAGOGASTRODUODENOSCOPY (EGD);  Surgeon: Cleotis Nipper, MD;  Location: [facility] ENDOSCOPY;  Service: Endoscopy;  Laterality: N/A;  . Colonoscopy N/A 11/02/2012    Procedure: COLONOSCOPY;  Surgeon: Cleotis Nipper, MD;  Location: [facility] ENDOSCOPY;  Service: Endoscopy;  Laterality: N/A;  . Colonoscopy N/A 11/03/2012    Procedure: COLONOSCOPY;  Surgeon: Cleotis Nipper, MD;  Location: [facility] ENDOSCOPY;  Service: Endoscopy;  Laterality: N/A;  . Givens capsule study N/A 11/04/2012    Procedure: GIVENS CAPSULE STUDY;  Surgeon: Cleotis Nipper, MD;  Location: [facility] ENDOSCOPY;  Service: Endoscopy;  Laterality: N/A;  . Enteroscopy N/A 11/08/2012    Procedure: ENTEROSCOPY;  Surgeon: Wonda Horner, MD;  Location[facility] ENDOSCOPY;  Service: Endoscopy;  Laterality: N/A;  . Amputation Left 05/12/2013    Procedure: AMPUTATION RAY;  Surgeon: Newt Minion, MD;  Location: [place];  Service: Orthopedics;  Laterality: Left;  Left Foot 1st Ray Amputation  . Eye surgery Left  to remove scar tissue  . Amputation Left 06/09/2013    Procedure: AMPUTATION BELOW KNEE;  Surgeon: Newt Minion, MD;  Location: [place];  Service: Orthopedics;  Laterality: Left;  Left Below Knee Amputation and removal proximal screws IM tibial nail  . Hardware removal Left 06/09/2013    Procedure: HARDWARE REMOVAL;  Surgeon: Newt Minion, MD;  Location: [place];  Service: Orthopedics;  Laterality: Left;  Left Below Knee Amputation  and Removal proximal screws IM tibial nail  . Amputation Right  09/08/2013    Procedure: AMPUTATION BELOW KNEE;  Surgeon: Newt Minion, MD;  Location: [place];  Service: Orthopedics;  Laterality: Right;  Right Below Knee Amputation  . Amputation Right 10/11/2013    Procedure: AMPUTATION BELOW KNEE;  Surgeon: Newt Minion, MD;  Location: [place];  Service: Orthopedics;  Laterality: Right;  Right Below Knee Amputation Revision    There were no vitals filed for this visit.  Visit Diagnosis:  Generalized weakness  Impaired functional mobility and activity tolerance      Subjective Assessment - 10/05/14 1446    Symptoms No new complaints. Reports doing his HEP at home. Still with back pain, does feel it is getting better a little bit.   Currently in Pain? Yes   Pain Score 6    Pain Location Back   Pain Orientation Right   Pain Descriptors / Indicators Aching;Sore   Pain Type Chronic pain   Pain Onset More than a month ago   Pain Frequency Intermittent   Aggravating Factors  movement   Pain Relieving Factors repositioning      Treatment: Exercise: Supine on mat: - single knee to chest 20 sec hold x 3 each side - double knee to chest 20 sec hold x 3 reps - lower trunk rotation stretch 20 sec x 3 each way - hip abduction and adduction stretching 20 sec holds each x 3 each to bil legs - with prostheses off: quad sets 5 sec hold x 10 reps bil legs; hamstring stretch with gentle overpressure at knee 20 sec's x 3 each leg  Seated on mat with inverted chair/pillow behind pt's back: - curl up 5 reps x 2 sets - with tall posture alternating elbow taps to mat 2 sets of 5 reps each side - upper body trunk rotation with reaching back/behind with same side arm left/right 2 sets of 5 reps each side   Min assist with sit<>supine on mat with cues on technique Min assist with lateral transfer mat to wheelchair with cues.        PT Short Term Goals - 10/01/14 1415    PT SHORT TERM GOAL #1   Title donnes prostheses with minimal assist (Target Date: 10/02/14)    Time 1   Period Months   Status Partially Met   PT SHORT TERM GOAL #2   Title wife & patient verbalize adjusting wear schedule to maximize functional potential. (Target Date: 10/02/14)   Time 1   Period Months   Status Achieved   PT SHORT TERM GOAL #3   Title performs squat-pivot transfer w/c to level mat with minimal assist. (Target Date: 10/02/14)   Baseline MET 09/26/14   Time 1   Period Months   Status Achieved   PT SHORT TERM GOAL #4   Title Sit to/from stand w/c to parallel bars with moderate assist (Target Date: 10/02/14)   Baseline MET 09/26/14   Time 1   Period Months   Status Achieved  PT SHORT TERM GOAL #5   Title patient & wife demonstrate initial HEP correctly. (Target Date: 10/02/14)   Baseline MET 09/21/14   Time 1   Period Months   Status Achieved   Additional Short Term Goals   Additional Short Term Goals Yes   PT SHORT TERM GOAL #6   Title reports wear of prostheses daily >80% of awake hours. (Target Date: 11/02/14)   Time 30   Period Days   Status New   PT SHORT TERM GOAL #7   Title Squat-pivot transfer with supervision. (Target Date: 11/02/14)   Time 30   Period Days   Status New   PT SHORT TERM GOAL #8   Title Sit to / from stand w/c to RW with moderate assist. (Target Date: 11/02/14)   Time 30   Period Days   Status New           PT Long Term Goals - 09/03/14 1015    PT LONG TERM GOAL #1   Title Patient and wife demonstrate proper prosthetic care. (Target Date: 01/02/15)   Time 4   Period Months   Status New   PT LONG TERM GOAL #2   Title tolerates wear of prostheses >90% of awake hours except at dialysis (Target Date: 01/02/15)   Time 4   Period Months   Status New   PT LONG TERM GOAL #3   Title stand pivot transfers with rolling walker & prostheses modified independent.  (Target Date: 01/02/15)   Time 4   Period Months   Status New   PT LONG TERM GOAL #4   Title manages clothes, reaches 5" and in upper /lower cabinets in standing with UE support  modified independent.  (Target Date: 01/02/15)   Time 4   Period Months   Status New   PT LONG TERM GOAL #5   Title ambulate 37' with rolling walker & prostheses with minimal assist.  (Target Date: 01/02/15)   Time 4   Period Months   Status New           Plan - 10/05/14 1448    Clinical Impression Statement Pt reports the stretching helps his back pain. Making slow progress toward goals.   Pt will benefit from skilled therapeutic intervention in order to improve on the following deficits Abnormal gait;Decreased activity tolerance;Decreased balance;Decreased endurance;Decreased knowledge of use of DME;Decreased mobility;Decreased range of motion;Decreased strength;Difficulty walking;Other (comment)  prosthetic dependency   Rehab Potential Good   Clinical Impairments Affecting Rehab Potential multiple medical issues over last year causing decondtioning   PT Frequency 2x / week   PT Duration Other (comment)  4 months   PT Treatment/Interventions ADLs/Self Care Home Management;Gait training;Stair training;DME Instruction;Functional mobility training;Therapeutic activities;Therapeutic exercise;Balance training;Neuromuscular re-education;Patient/family education;Manual techniques;Other (comment)  prosthetic training   PT Next Visit Plan G-code; strengthening and flexibility exercises on mat both with and without bil prostheses, standing with rolling walker   Consulted and Agree with Plan of Care Patient;Family member/caregiver   Family Member Consulted wife        Problem List Patient Active Problem List   Diagnosis Date Noted  . Diarrhea 08/14/2014  . Dehydration 10/02/2013  . Fever 10/02/2013  . Altered mental status 10/01/2013  . Encephalopathy, toxic 10/01/2013  . Encephalopathy, metabolic 01/00/7121  . CVA (cerebral vascular accident) 09/28/2013  . FTT (failure to thrive) in adult 09/28/2013  . Acute confusional state 09/28/2013  . Ulcer of sacral region, stage 3 09/26/2013   . S/P BKA (  below knee amputation) bilateral 09/08/2013  . ESRD on hemodialysis 05/09/2013  . TIA (transient ischemic attack) 02/20/2013  . Acute blood loss anemia 10/28/2012  . Chronic combined systolic (EF 86%) and grade 2diastolic congestive heart failure 10/28/2012  . Obstipation 10/28/2012  . GI bleed 10/27/2012  . Mass in rectum 10/27/2012  . DM (diabetes mellitus) type I controlled with renal manifestation 09/08/2012  . LVH (left ventricular hypertrophy)-severe concentric 06/13/2012  . Anemia due to chronic illness 06/12/2012  . Hyperlipidemia 01/30/2011  . CHOLELITHIASIS 08/01/2010  . BENIGN PROSTATIC HYPERTROPHY 08/01/2010  . CEREBROVASCULAR ACCIDENT, HX OF 08/06/2009  . Depression 03/18/2009  . Acute combined systolic and diastolic heart failure, NYHA class 2-EF 45% 03/18/2009  . NEPHROLITHIASIS, HX OF 03/18/2009  . Morbid obesity 03/25/2007  . Essential hypertension 03/25/2007  . GERD 03/25/2007  . HEPATITIS C, HX OF 03/25/2007    Willow Ora 10/05/2014, 4:09 PM  Willow Ora, PTA, [place] [address] [place], [place] [address] [phone] 10/05/2014, 4:09 PM

## 2014-10-05 NOTE — Therapy (Signed)
[facility] [address] [place], [place], [address] Phone: [phone]   Fax:  [phone]  Occupational Therapy Treatment  Patient Details  Name: Oscar Castillo MRN: [id number] Date of Birth: November 01, 1950 Referring Provider:  [name], MD  Encounter Date: 10/05/2014      OT End of Session - 10/05/14 1413    Visit Number 9   Date for OT Re-Evaluation 10/29/14   Authorization Type medicare needs G code  next visit!!!!   Authorization - Visit Number 9   Authorization - Number of Visits 10   OT Start Time 6962   OT Stop Time 1445   OT Time Calculation (min) 42 min   Activity Tolerance Patient tolerated treatment well   Behavior During Therapy WFL for tasks assessed/performed      Past Medical History  Diagnosis Date  . ESRD on hemodialysis 05/05/2007    ESRD due to DM/HTN. Started dialysis in November 2013.  HD TTS at [facility] on [place].  [name] BACK PAIN, LUMBAR, CHRONIC 08/06/2009  . BENIGN PROSTATIC HYPERTROPHY 08/01/2010  . CEREBROVASCULAR ACCIDENT, HX OF 08/06/2009  . CHOLELITHIASIS 08/01/2010  . CONGESTIVE HEART FAILURE 03/18/2009  . DEPRESSION 03/18/2009  . DIABETES MELLITUS, TYPE II 03/25/2007  . ERECTILE DYSFUNCTION 03/25/2007  . GERD 03/25/2007  . HEPATITIS C, HX OF 03/25/2007  . HYPERTENSION 03/25/2007  . Morbid obesity 03/25/2007  . NEPHROLITHIASIS, HX OF 03/18/2009  . Complication of anesthesia     wife states pt had trouble waking up with his last surgery in Nov., 2014    Past Surgical History  Procedure Laterality Date  . Nephrectomy      partial RR  . Av fistula placement  06/14/2012    Procedure: ARTERIOVENOUS (AV) FISTULA CREATION;  Surgeon: Angelia Mould, MD;  Location: [facility] OR;  Service: Vascular;  Laterality: Left;  Left basilic vein transposition with fistula.  . Tibia im nail insertion Left 09/09/2012    Procedure: INTRAMEDULLARY (IM) NAIL TIBIAL;  Surgeon: Johnny Bridge, MD;   Location: [place];  Service: Orthopedics;  Laterality: Left;  left tibial nail and open reduction internal fixation left fibula fracture  . Orif fibula fracture Left 09/09/2012    Procedure: OPEN REDUCTION INTERNAL FIXATION (ORIF) FIBULA FRACTURE;  Surgeon: Johnny Bridge, MD;  Location: [place];  Service: Orthopedics;  Laterality: Left;  . Colonoscopy N/A 10/28/2012    Procedure: COLONOSCOPY;  Surgeon: Jeryl Columbia, MD;  Location: [facility] ENDOSCOPY;  Service: Endoscopy;  Laterality: N/A;  . Esophagogastroduodenoscopy N/A 11/02/2012    Procedure: ESOPHAGOGASTRODUODENOSCOPY (EGD);  Surgeon: Cleotis Nipper, MD;  Location: [facility] ENDOSCOPY;  Service: Endoscopy;  Laterality: N/A;  . Colonoscopy N/A 11/02/2012    Procedure: COLONOSCOPY;  Surgeon: Cleotis Nipper, MD;  Location: [facility] ENDOSCOPY;  Service: Endoscopy;  Laterality: N/A;  . Colonoscopy N/A 11/03/2012    Procedure: COLONOSCOPY;  Surgeon: Cleotis Nipper, MD;  Location: [facility] ENDOSCOPY;  Service: Endoscopy;  Laterality: N/A;  . Givens capsule study N/A 11/04/2012    Procedure: GIVENS CAPSULE STUDY;  Surgeon: Cleotis Nipper, MD;  Location: [facility] ENDOSCOPY;  Service: Endoscopy;  Laterality: N/A;  . Enteroscopy N/A 11/08/2012    Procedure: ENTEROSCOPY;  Surgeon: Wonda Horner, MD;  Location: [facility] ENDOSCOPY;  Service: Endoscopy;  Laterality: N/A;  . Amputation Left 05/12/2013    Procedure: AMPUTATION RAY;  Surgeon: Newt Minion, MD;  Location: [place];  Service: Orthopedics;  Laterality: Left;  Left Foot  1st Ray Amputation  . Eye surgery Left     to remove scar tissue  . Amputation Left 06/09/2013    Procedure: AMPUTATION BELOW KNEE;  Surgeon: Newt Minion, MD;  Location: [place];  Service: Orthopedics;  Laterality: Left;  Left Below Knee Amputation and removal proximal screws IM tibial nail  . Hardware removal Left 06/09/2013    Procedure: HARDWARE REMOVAL;  Surgeon: Newt Minion, MD;  Location: [place];  Service: Orthopedics;  Laterality: Left;  Left Below Knee  Amputation  and Removal proximal screws IM tibial nail  . Amputation Right 09/08/2013    Procedure: AMPUTATION BELOW KNEE;  Surgeon: Newt Minion, MD;  Location: [place];  Service: Orthopedics;  Laterality: Right;  Right Below Knee Amputation  . Amputation Right 10/11/2013    Procedure: AMPUTATION BELOW KNEE;  Surgeon: Newt Minion, MD;  Location: [place];  Service: Orthopedics;  Laterality: Right;  Right Below Knee Amputation Revision    There were no vitals filed for this visit.  Visit Diagnosis:  Impaired functional mobility and activity tolerance  Generalized weakness  Debility      Subjective Assessment - 10/05/14 1412    Pertinent History see epic snapshot   Currently in Pain? Yes   Pain Score 7    Pain Location Back   Pain Descriptors / Indicators Aching   Pain Type Chronic pain   Pain Onset More than a month ago   Pain Frequency Intermittent   Aggravating Factors  movement   Pain Relieving Factors repositioning   Multiple Pain Sites No                     Treatment: Theraputic exercise: Seated unsupported on edge of mat pt performed 2 sets each of each with 2 lbs weight, for shoulder flexion, biceps curls, (3 sets for chest press) with rest in between each set of 10 reps. Push up blocks for 10 reps, hold for 3 secs,w/c to mat transfer with min A scooting. Seated edge of mat, dynamic reaching activity with RUE,then LUE requiring trunk rotation, lateral  weightshift in seated position, min difficulty, v.c.and frequent rest breaks.         OT Short Term Goals - 10/01/14 1221    OT SHORT TERM GOAL #1   Title Pt and wife will be mod I with HEP - 10/01/2014   Status Achieved   OT SHORT TERM GOAL #2   Title Pt will demonstrate increased grip strength bilaterally combined by at least 5 pounds (baseline = R= 38 pounds, L= 35 pounds)  R= 60 pounds  L= 45   Status Achieved   OT SHORT TERM GOAL #3   Title Pt will demonstrate ability to  be min a with UB bathing  at either bed or w/c level   Status Achieved   OT SHORT TERM GOAL #4   Title Pt will demonstrate ability to dress UB with mod a at either bed or w/c level  wheelchair level   Status On-going   OT SHORT TERM GOAL #5   Title Pt will be supervision for sitting balance with one UE support for at least 5 minutes of a task.   Status Achieved           OT Long Term Goals - 10/01/14 1222    OT LONG TERM GOAL #1   Title Pt and wife will be mod I with upgraded HEP - 10/29/2014   Status On-going  OT LONG TERM GOAL #2   Title Pt will demonstrate ability to be  be supervision with UB bathing w/c level   Status On-going   OT LONG TERM GOAL #3   Title Pt will demonstrate ability to be min a with LB bathing    Status On-going   OT LONG TERM GOAL #4   Title Pt will demonstrate ability to dress UB with supervision at w/c level   Status Achieved   OT LONG TERM GOAL #5   Status On-going               Plan - 10/05/14 1656    Clinical Impression Statement Pt is progressing towards goals yet limited by decreased endurance.   Plan UE strength, transfers.   OT Home Exercise Plan red putty , theraband   Consulted and Agree with Plan of Care Patient   Family Member Consulted wife        Problem List Patient Active Problem List   Diagnosis Date Noted  . Diarrhea 08/14/2014  . Dehydration 10/02/2013  . Fever 10/02/2013  . Altered mental status 10/01/2013  . Encephalopathy, toxic 10/01/2013  . Encephalopathy, metabolic 66/59/9357  . CVA (cerebral vascular accident) 09/28/2013  . FTT (failure to thrive) in adult 09/28/2013  . Acute confusional state 09/28/2013  . Ulcer of sacral region, stage 3 09/26/2013  . S/P BKA (below knee amputation) bilateral 09/08/2013  . ESRD on hemodialysis 05/09/2013  . TIA (transient ischemic attack) 02/20/2013  . Acute blood loss anemia 10/28/2012  . Chronic combined systolic (EF 01%) and grade 2diastolic congestive heart failure 10/28/2012  .  Obstipation 10/28/2012  . GI bleed 10/27/2012  . Mass in rectum 10/27/2012  . DM (diabetes mellitus) type I controlled with renal manifestation 09/08/2012  . LVH (left ventricular hypertrophy)-severe concentric 06/13/2012  . Anemia due to chronic illness 06/12/2012  . Hyperlipidemia 01/30/2011  . CHOLELITHIASIS 08/01/2010  . BENIGN PROSTATIC HYPERTROPHY 08/01/2010  . CEREBROVASCULAR ACCIDENT, HX OF 08/06/2009  . Depression 03/18/2009  . Acute combined systolic and diastolic heart failure, NYHA class 2-EF 45% 03/18/2009  . NEPHROLITHIASIS, HX OF 03/18/2009  . Morbid obesity 03/25/2007  . Essential hypertension 03/25/2007  . GERD 03/25/2007  . HEPATITIS C, HX OF 03/25/2007    RINE,KATHRYN 10/05/2014, 4:58 PM Theone Murdoch, OTR/L [phone] Phone: [phone] 4:58 PM 10/05/2014 [facility] [address] [place], [place], [address] Phone: [phone]   Fax:  [phone]

## 2014-10-06 DIAGNOSIS — N2581 Secondary hyperparathyroidism of renal origin: Secondary | ICD-10-CM | POA: Diagnosis not present

## 2014-10-06 DIAGNOSIS — D509 Iron deficiency anemia, unspecified: Secondary | ICD-10-CM | POA: Diagnosis not present

## 2014-10-06 DIAGNOSIS — N186 End stage renal disease: Secondary | ICD-10-CM | POA: Diagnosis not present

## 2014-10-06 DIAGNOSIS — E1122 Type 2 diabetes mellitus with diabetic chronic kidney disease: Secondary | ICD-10-CM | POA: Diagnosis not present

## 2014-10-08 ENCOUNTER — Ambulatory Visit: Payer: Medicare Other | Admitting: Occupational Therapy

## 2014-10-08 ENCOUNTER — Encounter: Payer: Self-pay | Admitting: Occupational Therapy

## 2014-10-08 ENCOUNTER — Encounter: Payer: Self-pay | Admitting: Physical Therapy

## 2014-10-08 ENCOUNTER — Ambulatory Visit: Payer: Medicare Other | Admitting: Physical Therapy

## 2014-10-08 DIAGNOSIS — R5381 Other malaise: Secondary | ICD-10-CM | POA: Diagnosis not present

## 2014-10-08 DIAGNOSIS — Z89511 Acquired absence of right leg below knee: Secondary | ICD-10-CM | POA: Diagnosis not present

## 2014-10-08 DIAGNOSIS — R531 Weakness: Secondary | ICD-10-CM

## 2014-10-08 DIAGNOSIS — Z7409 Other reduced mobility: Secondary | ICD-10-CM | POA: Diagnosis not present

## 2014-10-08 DIAGNOSIS — Z89512 Acquired absence of left leg below knee: Secondary | ICD-10-CM | POA: Diagnosis not present

## 2014-10-08 DIAGNOSIS — R4189 Other symptoms and signs involving cognitive functions and awareness: Secondary | ICD-10-CM | POA: Diagnosis not present

## 2014-10-08 NOTE — Therapy (Signed)
[name] [address] [place], [place], [address] Phone: [phone]   Fax:  [phone]  Physical Therapy Treatment  Patient Details  Name: Oscar Castillo MRN: [id number] Date of Birth: 1950/12/09 Referring Provider:  [name], MD  Encounter Date: 10/08/2014      PT End of Session - 10/08/14 1108    Visit Number 10   Number of Visits 34   Date for PT Re-Evaluation 11/02/14   PT Start Time 1103   PT Stop Time 1142   PT Time Calculation (min) 39 min   Equipment Utilized During Treatment Gait belt   Activity Tolerance Patient tolerated treatment well   Behavior During Therapy [facility] for tasks assessed/performed      Past Medical History  Diagnosis Date  . ESRD on hemodialysis 05/05/2007    ESRD due to DM/HTN. Started dialysis in November 2013.  HD TTS at [facility] on [place].  [name] BACK PAIN, LUMBAR, CHRONIC 08/06/2009  . BENIGN PROSTATIC HYPERTROPHY 08/01/2010  . CEREBROVASCULAR ACCIDENT, HX OF 08/06/2009  . CHOLELITHIASIS 08/01/2010  . CONGESTIVE HEART FAILURE 03/18/2009  . DEPRESSION 03/18/2009  . DIABETES MELLITUS, TYPE II 03/25/2007  . ERECTILE DYSFUNCTION 03/25/2007  . GERD 03/25/2007  . HEPATITIS C, HX OF 03/25/2007  . HYPERTENSION 03/25/2007  . Morbid obesity 03/25/2007  . NEPHROLITHIASIS, HX OF 03/18/2009  . Complication of anesthesia     wife states pt had trouble waking up with his last surgery in Nov., 2014    Past Surgical History  Procedure Laterality Date  . Nephrectomy      partial RR  . Av fistula placement  06/14/2012    Procedure: ARTERIOVENOUS (AV) FISTULA CREATION;  Surgeon: Angelia Mould, MD;  Location: [facility] OR;  Service: Vascular;  Laterality: Left;  Left basilic vein transposition with fistula.  . Tibia im nail insertion Left 09/09/2012    Procedure: INTRAMEDULLARY (IM) NAIL TIBIAL;  Surgeon: Johnny Bridge, MD;  Location: [place];  Service: Orthopedics;  Laterality: Left;  left  tibial nail and open reduction internal fixation left fibula fracture  . Orif fibula fracture Left 09/09/2012    Procedure: OPEN REDUCTION INTERNAL FIXATION (ORIF) FIBULA FRACTURE;  Surgeon: Johnny Bridge, MD;  Location: [place];  Service: Orthopedics;  Laterality: Left;  . Colonoscopy N/A 10/28/2012    Procedure: COLONOSCOPY;  Surgeon: Jeryl Columbia, MD;  Location: [facility] ENDOSCOPY;  Service: Endoscopy;  Laterality: N/A;  . Esophagogastroduodenoscopy N/A 11/02/2012    Procedure: ESOPHAGOGASTRODUODENOSCOPY (EGD);  Surgeon: Cleotis Nipper, MD;  Location: [facility] ENDOSCOPY;  Service: Endoscopy;  Laterality: N/A;  . Colonoscopy N/A 11/02/2012    Procedure: COLONOSCOPY;  Surgeon: Cleotis Nipper, MD;  Location: [facility] ENDOSCOPY;  Service: Endoscopy;  Laterality: N/A;  . Colonoscopy N/A 11/03/2012    Procedure: COLONOSCOPY;  Surgeon: Cleotis Nipper, MD;  Location: [facility] ENDOSCOPY;  Service: Endoscopy;  Laterality: N/A;  . Givens capsule study N/A 11/04/2012    Procedure: GIVENS CAPSULE STUDY;  Surgeon: Cleotis Nipper, MD;  Location: [facility] ENDOSCOPY;  Service: Endoscopy;  Laterality: N/A;  . Enteroscopy N/A 11/08/2012    Procedure: ENTEROSCOPY;  Surgeon: Wonda Horner, MD;  Location: [facility] ENDOSCOPY;  Service: Endoscopy;  Laterality: N/A;  . Amputation Left 05/12/2013    Procedure: AMPUTATION RAY;  Surgeon: Newt Minion, MD;  Location: [place];  Service: Orthopedics;  Laterality: Left;  Left Foot 1st Ray Amputation  . Eye surgery Left  to remove scar tissue  . Amputation Left 06/09/2013    Procedure: AMPUTATION BELOW KNEE;  Surgeon: Newt Minion, MD;  Location: [place];  Service: Orthopedics;  Laterality: Left;  Left Below Knee Amputation and removal proximal screws IM tibial nail  . Hardware removal Left 06/09/2013    Procedure: HARDWARE REMOVAL;  Surgeon: Newt Minion, MD;  Location: [place];  Service: Orthopedics;  Laterality: Left;  Left Below Knee Amputation  and Removal proximal screws IM tibial nail  . Amputation  Right 09/08/2013    Procedure: AMPUTATION BELOW KNEE;  Surgeon: Newt Minion, MD;  Location: [place];  Service: Orthopedics;  Laterality: Right;  Right Below Knee Amputation  . Amputation Right 10/11/2013    Procedure: AMPUTATION BELOW KNEE;  Surgeon: Newt Minion, MD;  Location: [place];  Service: Orthopedics;  Laterality: Right;  Right Below Knee Amputation Revision    There were no vitals filed for this visit.  Visit Diagnosis:  Generalized weakness  Debility  Impaired functional mobility and activity tolerance      Subjective Assessment - 10/08/14 1106    Symptoms No new complaints. Back is better today. Having hand and wrist pain today.   Pain Score 8    Pain Orientation Right;Left  right > left   Pain Descriptors / Indicators Aching;Tightness   Pain Type Chronic pain   Pain Frequency Occasional   Aggravating Factors  unknown   Pain Relieving Factors unknown          [facility] Adult PT Treatment/Exercise - 10/08/14 1109    Prosthetics   Prosthetic Care Comments  pt/spouse to work on increasing wear times. both report pt is now able to assist more with donning/doffing liner and prostheses   Current prosthetic wear tolerance (days/week)  7 days   Current prosthetic wear tolerance (#hours/day)  2 hours 2 x day   Residual limb condition  scars present but no openings   Education Provided Residual limb care;Proper wear schedule/adjustment   Person(s) Educated Patient;Spouse   Education Method Explanation   Education Method Verbalized understanding   Donning Prosthesis Minimal assist   Doffing Prosthesis Minimal assist     Treatment: Exercises Seated edge of mat with inverted chair/pillow behind him - curl ups with arms crossed over chest x 10 reps - seated up tall alternating elbow taps to mat x 10 each side - seated up tall alternating marching x 10 each side - seated up tall alternating long arc quads x 6 each side, pt unable to make it to 10 due to fatigue - with red  theraband around legs above knees: hip abduction/ER x 10 reps with 5 sec hold - with yellow theraband: hamstring curls x 10 reps each leg - passive hamstring stretching with prosthetic propped on therapist leg and gentle overpressure to knee. Assist needed to maintain positioning. 1 minute hold with right leg. 30 sec holds x 3 reps to left leg due to pt reported increased discomfort on this side with stretching.  Sit to stand mat <> walker 1. Max assist with cues on anterior weight shifting to stand and on hand placement. Mat elevated to assist with standing. Pt able to stand 30 seconds with mod assist and cues on posture.  2. Second stand with 2 person min assist (unable to achieve standing on second trial with one person assist). Cues as above. Pt able to maintain standing for 20 seconds this time.         PT Short Term  Goals - 10/01/14 1415    PT SHORT TERM GOAL #1   Title donnes prostheses with minimal assist (Target Date: 10/02/14)   Time 1   Period Months   Status Partially Met   PT SHORT TERM GOAL #2   Title wife & patient verbalize adjusting wear schedule to maximize functional potential. (Target Date: 10/02/14)   Time 1   Period Months   Status Achieved   PT SHORT TERM GOAL #3   Title performs squat-pivot transfer w/c to level mat with minimal assist. (Target Date: 10/02/14)   Baseline MET 09/26/14   Time 1   Period Months   Status Achieved   PT SHORT TERM GOAL #4   Title Sit to/from stand w/c to parallel bars with moderate assist (Target Date: 10/02/14)   Baseline MET 09/26/14   Time 1   Period Months   Status Achieved   PT SHORT TERM GOAL #5   Title patient & wife demonstrate initial HEP correctly. (Target Date: 10/02/14)   Baseline MET 09/21/14   Time 1   Period Months   Status Achieved   Additional Short Term Goals   Additional Short Term Goals Yes   PT SHORT TERM GOAL #6   Title reports wear of prostheses daily >80% of awake hours. (Target Date: 11/02/14)   Time 30    Period Days   Status New   PT SHORT TERM GOAL #7   Title Squat-pivot transfer with supervision. (Target Date: 11/02/14)   Time 30   Period Days   Status New   PT SHORT TERM GOAL #8   Title Sit to / from stand w/c to RW with moderate assist. (Target Date: 11/02/14)   Time 30   Period Days   Status New           PT Long Term Goals - 09/03/14 1015    PT LONG TERM GOAL #1   Title Patient and wife demonstrate proper prosthetic care. (Target Date: 01/02/15)   Time 4   Period Months   Status New   PT LONG TERM GOAL #2   Title tolerates wear of prostheses >90% of awake hours except at dialysis (Target Date: 01/02/15)   Time 4   Period Months   Status New   PT LONG TERM GOAL #3   Title stand pivot transfers with rolling walker & prostheses modified independent.  (Target Date: 01/02/15)   Time 4   Period Months   Status New   PT LONG TERM GOAL #4   Title manages clothes, reaches 5" and in upper /lower cabinets in standing with UE support modified independent.  (Target Date: 01/02/15)   Time 4   Period Months   Status New   PT LONG TERM GOAL #5   Title ambulate 87' with rolling walker & prostheses with minimal assist.  (Target Date: 01/02/15)   Time 4   Period Months   Status New           Plan - 10/08/14 1108    Clinical Impression Statement Pt reports his back is doing better. Able to stand to RW x 2 today without any exaccerbation of his back pain. Continues to slowly progress toward goals.   Pt will benefit from skilled therapeutic intervention in order to improve on the following deficits Abnormal gait;Decreased activity tolerance;Decreased balance;Decreased endurance;Decreased knowledge of use of DME;Decreased mobility;Decreased range of motion;Decreased strength;Difficulty walking;Other (comment)  prosthetic dependency   Rehab Potential Good   Clinical Impairments Affecting Rehab Potential  multiple medical issues over last year causing decondtioning   PT Frequency 2x / week    PT Duration Other (comment)  4 months   PT Treatment/Interventions ADLs/Self Care Home Management;Gait training;Stair training;DME Instruction;Functional mobility training;Therapeutic activities;Therapeutic exercise;Balance training;Neuromuscular re-education;Patient/family education;Manual techniques;Other (comment)  prosthetic training   PT Next Visit Plan  strengthening and flexibility exercises on mat both with and without bil prostheses, standing with rolling walker   Consulted and Agree with Plan of Care Patient;Family member/caregiver   Family Member Consulted wife        Problem List Patient Active Problem List   Diagnosis Date Noted  . Diarrhea 08/14/2014  . Dehydration 10/02/2013  . Fever 10/02/2013  . Altered mental status 10/01/2013  . Encephalopathy, toxic 10/01/2013  . Encephalopathy, metabolic 72/18/2883  . CVA (cerebral vascular accident) 09/28/2013  . FTT (failure to thrive) in adult 09/28/2013  . Acute confusional state 09/28/2013  . Ulcer of sacral region, stage 3 09/26/2013  . S/P BKA (below knee amputation) bilateral 09/08/2013  . ESRD on hemodialysis 05/09/2013  . TIA (transient ischemic attack) 02/20/2013  . Acute blood loss anemia 10/28/2012  . Chronic combined systolic (EF 37%) and grade 2diastolic congestive heart failure 10/28/2012  . Obstipation 10/28/2012  . GI bleed 10/27/2012  . Mass in rectum 10/27/2012  . DM (diabetes mellitus) type I controlled with renal manifestation 09/08/2012  . LVH (left ventricular hypertrophy)-severe concentric 06/13/2012  . Anemia due to chronic illness 06/12/2012  . Hyperlipidemia 01/30/2011  . CHOLELITHIASIS 08/01/2010  . BENIGN PROSTATIC HYPERTROPHY 08/01/2010  . CEREBROVASCULAR ACCIDENT, HX OF 08/06/2009  . Depression 03/18/2009  . Acute combined systolic and diastolic heart failure, NYHA class 2-EF 45% 03/18/2009  . NEPHROLITHIASIS, HX OF 03/18/2009  . Morbid obesity 03/25/2007  . Essential hypertension  03/25/2007  . GERD 03/25/2007  . HEPATITIS C, HX OF 03/25/2007    Willow Ora 10/08/2014, 11:49 AM  Willow Ora, PTA, [facility] [address] [place], [place] [address] [phone] 10/08/2014, 11:50 AM

## 2014-10-08 NOTE — Therapy (Signed)
[facility] [address] [place], [place], [address] Phone: [phone]   Fax:  [phone]  Occupational Therapy Treatment  Patient Details  Name: Oscar Castillo MRN: [id number] Date of Birth: 09/17/1950 Referring Provider:  [name], MD  Encounter Date: 10/08/2014      OT End of Session - 10/08/14 1244    Visit Number 10   Number of Visits 16   Date for OT Re-Evaluation 10/29/14   Authorization Type medicare needs G code  next visit!!!!   OT Start Time 1145   OT Stop Time 1225   OT Time Calculation (min) 40 min   Activity Tolerance Patient limited by fatigue      Past Medical History  Diagnosis Date  . ESRD on hemodialysis 05/05/2007    ESRD due to DM/HTN. Started dialysis in November 2013.  HD TTS at [facility] on [place].  [name] BACK PAIN, LUMBAR, CHRONIC 08/06/2009  . BENIGN PROSTATIC HYPERTROPHY 08/01/2010  . CEREBROVASCULAR ACCIDENT, HX OF 08/06/2009  . CHOLELITHIASIS 08/01/2010  . CONGESTIVE HEART FAILURE 03/18/2009  . DEPRESSION 03/18/2009  . DIABETES MELLITUS, TYPE II 03/25/2007  . ERECTILE DYSFUNCTION 03/25/2007  . GERD 03/25/2007  . HEPATITIS C, HX OF 03/25/2007  . HYPERTENSION 03/25/2007  . Morbid obesity 03/25/2007  . NEPHROLITHIASIS, HX OF 03/18/2009  . Complication of anesthesia     wife states pt had trouble waking up with his last surgery in Nov., 2014    Past Surgical History  Procedure Laterality Date  . Nephrectomy      partial RR  . Av fistula placement  06/14/2012    Procedure: ARTERIOVENOUS (AV) FISTULA CREATION;  Surgeon: Angelia Mould, MD;  Location: [facility] OR;  Service: Vascular;  Laterality: Left;  Left basilic vein transposition with fistula.  . Tibia im nail insertion Left 09/09/2012    Procedure: INTRAMEDULLARY (IM) NAIL TIBIAL;  Surgeon: Johnny Bridge, MD;  Location: [place];  Service: Orthopedics;  Laterality: Left;  left tibial nail and open reduction internal fixation left  fibula fracture  . Orif fibula fracture Left 09/09/2012    Procedure: OPEN REDUCTION INTERNAL FIXATION (ORIF) FIBULA FRACTURE;  Surgeon: Johnny Bridge, MD;  Location: [place];  Service: Orthopedics;  Laterality: Left;  . Colonoscopy N/A 10/28/2012    Procedure: COLONOSCOPY;  Surgeon: Jeryl Columbia, MD;  Location: [facility] ENDOSCOPY;  Service: Endoscopy;  Laterality: N/A;  . Esophagogastroduodenoscopy N/A 11/02/2012    Procedure: ESOPHAGOGASTRODUODENOSCOPY (EGD);  Surgeon: Cleotis Nipper, MD;  Location: [facility] ENDOSCOPY;  Service: Endoscopy;  Laterality: N/A;  . Colonoscopy N/A 11/02/2012    Procedure: COLONOSCOPY;  Surgeon: Cleotis Nipper, MD;  Location: [facility] ENDOSCOPY;  Service: Endoscopy;  Laterality: N/A;  . Colonoscopy N/A 11/03/2012    Procedure: COLONOSCOPY;  Surgeon: Cleotis Nipper, MD;  Location: [facility] ENDOSCOPY;  Service: Endoscopy;  Laterality: N/A;  . Givens capsule study N/A 11/04/2012    Procedure: GIVENS CAPSULE STUDY;  Surgeon: Cleotis Nipper, MD;  Location: [facility] ENDOSCOPY;  Service: Endoscopy;  Laterality: N/A;  . Enteroscopy N/A 11/08/2012    Procedure: ENTEROSCOPY;  Surgeon: Wonda Horner, MD;  Location: [facility] ENDOSCOPY;  Service: Endoscopy;  Laterality: N/A;  . Amputation Left 05/12/2013    Procedure: AMPUTATION RAY;  Surgeon: Newt Minion, MD;  Location: [place];  Service: Orthopedics;  Laterality: Left;  Left Foot 1st Ray Amputation  . Eye surgery Left     to remove scar tissue  .  Amputation Left 06/09/2013    Procedure: AMPUTATION BELOW KNEE;  Surgeon: Newt Minion, MD;  Location: [place];  Service: Orthopedics;  Laterality: Left;  Left Below Knee Amputation and removal proximal screws IM tibial nail  . Hardware removal Left 06/09/2013    Procedure: HARDWARE REMOVAL;  Surgeon: Newt Minion, MD;  Location: [place];  Service: Orthopedics;  Laterality: Left;  Left Below Knee Amputation  and Removal proximal screws IM tibial nail  . Amputation Right 09/08/2013    Procedure: AMPUTATION BELOW KNEE;   Surgeon: Newt Minion, MD;  Location: [place];  Service: Orthopedics;  Laterality: Right;  Right Below Knee Amputation  . Amputation Right 10/11/2013    Procedure: AMPUTATION BELOW KNEE;  Surgeon: Newt Minion, MD;  Location: [place];  Service: Orthopedics;  Laterality: Right;  Right Below Knee Amputation Revision    There were no vitals filed for this visit.  Visit Diagnosis:  Generalized weakness  Impaired functional mobility and activity tolerance      Subjective Assessment - 10/08/14 1149    Symptoms "I feel so tired" Wife reported today that pt has had intermittent severd diarrhea since 07/21/2015. MD is aware and wife has put in another call - encouraged wife to continue to communicate this to MD as well as severity of it.    Pertinent History see epic snapshot   Currently in Pain? Yes   Pain Score 8    Pain Location Hand   Pain Orientation Right;Left   Pain Descriptors / Indicators Aching;Tightness   Pain Type Acute pain   Pain Onset Today   Pain Frequency Intermittent   Aggravating Factors  Just woke up with the pain today   Pain Relieving Factors unknown                    OT Treatments/Exercises (OP) - 10/08/14 0001    ADLs   Functional Mobility Addressed sitting balance at EOB initially with prosethetic feet on floor and then with no Oscar support - introduced low reach activities to encourage maintaing balance with forward and downward reach without prosthesis so that pt can try to sit EOB at home and on prostheses (wife currently doing). Suggested wife have pt sit as far back on EOB as possible and don one prostheses and let pt try to don the other other prosthesis.   ADL Comments Wife reports pt can now don pants and underwear up to hip level, then do push up in wheelchair so that she can hike pants.  Pt able to wash everything except his bottom - provided three strategies for pt to try this week at home. Wife able to verbalize understanding.    Exercises    Exercises Shoulder   Shoulder Exercises: Seated   Other Seated Exercises Pt tolerated UE exercises in sitting EOM with 2 pound weight (chest presses) 10 reps x2, Pt then tranferred to wheelchair and able to do 2 more exercises without weight (shoulder flexion, abduction) 12 reps x2.                  OT Short Term Goals - 10/08/14 1247    OT SHORT TERM GOAL #1   Title Pt and wife will be mod I with HEP - 10/01/2014   Status Achieved   OT SHORT TERM GOAL #2   Title Pt will demonstrate increased grip strength bilaterally combined by at least 5 pounds (baseline = R= 38 pounds, L= 35 pounds)  R= 60  pounds  L= 45   Status Achieved   OT SHORT TERM GOAL #3   Title Pt will demonstrate ability to  be min a with UB bathing at either bed or w/c level   Status Achieved   OT SHORT TERM GOAL #4   Title Pt will demonstrate ability to dress UB with mod a at either bed or w/c level  wheelchair level   Status Achieved   OT SHORT TERM GOAL #5   Title Pt will be supervision for sitting balance with one UE support for at least 5 minutes of a task.   Status Achieved           OT Long Term Goals - October 16, 2014 1247    OT LONG TERM GOAL #1   Title Pt and wife will be mod I with upgraded HEP - 10/29/2014   Status On-going   OT LONG TERM GOAL #2   Title Pt will demonstrate ability to be  be supervision with UB bathing w/c level   Status Achieved   OT LONG TERM GOAL #3   Title Pt will demonstrate ability to be min a with LB bathing    Status On-going   OT LONG TERM GOAL #4   Title Pt will demonstrate ability to dress UB with supervision at w/c level   Status Achieved   OT LONG TERM GOAL #5   Status On-going               Plan - 10-16-2014 1245    Clinical Impression Statement Pt coninues to make progress toward goals despite fatigue.  Pt reports sore muscles due to being more active. Pt requires max encouragment to participate fully in therapy. Wife reports pt cooked dinner this weekend.    Pt will benefit from skilled therapeutic intervention in order to improve on the following deficits (Retired) Cardiopulmonary status limiting activity;Decreased activity tolerance;Decreased balance;Decreased cognition;Decreased mobility;Decreased knowledge of use of DME;Decreased knowledge of precautions;Decreased endurance;Decreased strength   Rehab Potential Fair   Clinical Impairments Affecting Rehab Potential Pt can benefit from skilled OT to address the above deficits.   OT Frequency 2x / week   OT Duration 8 weeks   OT Treatment/Interventions Self-care/ADL training;DME and/or AE instruction;Energy conservation;Therapeutic exercise;Therapist, nutritional;Therapeutic activities;Balance training;Patient/family education   Plan UE strength, balance, transfers   Consulted and Agree with Plan of Care Patient   Family Member Consulted wife          G-Codes - 10-16-2014 1248    Self Care Current Status [phone]) At least 46 percent but less than 100 percent impaired, limited or restricted   Self Care Goal Status (E0814) At least 60 percent but less than 80 percent impaired, limited or restricted      Problem List Patient Active Problem List   Diagnosis Date Noted  . Diarrhea 08/14/2014  . Dehydration 10/02/2013  . Fever 10/02/2013  . Altered mental status 10/01/2013  . Encephalopathy, toxic 10/01/2013  . Encephalopathy, metabolic 48/18/5631  . CVA (cerebral vascular accident) 09/28/2013  . FTT (failure to thrive) in adult 09/28/2013  . Acute confusional state 09/28/2013  . Ulcer of sacral region, stage 3 09/26/2013  . S/P BKA (below knee amputation) bilateral 09/08/2013  . ESRD on hemodialysis 05/09/2013  . TIA (transient ischemic attack) 02/20/2013  . Acute blood loss anemia 10/28/2012  . Chronic combined systolic (EF 49%) and grade 2diastolic congestive heart failure 10/28/2012  . Obstipation 10/28/2012  . GI bleed 10/27/2012  . Mass in rectum 10/27/2012  .  DM (diabetes  mellitus) type I controlled with renal manifestation 09/08/2012  . LVH (left ventricular hypertrophy)-severe concentric 06/13/2012  . Anemia due to chronic illness 06/12/2012  . Hyperlipidemia 01/30/2011  . CHOLELITHIASIS 08/01/2010  . BENIGN PROSTATIC HYPERTROPHY 08/01/2010  . CEREBROVASCULAR ACCIDENT, HX OF 08/06/2009  . Depression 03/18/2009  . Acute combined systolic and diastolic heart failure, NYHA class 2-EF 45% 03/18/2009  . NEPHROLITHIASIS, HX OF 03/18/2009  . Morbid obesity 03/25/2007  . Essential hypertension 03/25/2007  . GERD 03/25/2007  . HEPATITIS C, HX OF 03/25/2007    Quay Burow, OTR/L 10/08/2014, 12:51 PM  [facility] [address] [place], [place], [address] Phone: [phone]   Fax:  [phone]

## 2014-10-09 ENCOUNTER — Telehealth: Payer: Self-pay

## 2014-10-09 DIAGNOSIS — D509 Iron deficiency anemia, unspecified: Secondary | ICD-10-CM | POA: Diagnosis not present

## 2014-10-09 DIAGNOSIS — N2581 Secondary hyperparathyroidism of renal origin: Secondary | ICD-10-CM | POA: Diagnosis not present

## 2014-10-09 DIAGNOSIS — E1122 Type 2 diabetes mellitus with diabetic chronic kidney disease: Secondary | ICD-10-CM | POA: Diagnosis not present

## 2014-10-09 DIAGNOSIS — N186 End stage renal disease: Secondary | ICD-10-CM | POA: Diagnosis not present

## 2014-10-09 NOTE — Telephone Encounter (Signed)
The Patient stated he had the flu vaccine at the dialysis center this fall, not sure what month.  Also requested information regarding consult to GI, as Diarrhea has continued. Checked on referral and one in process; marked "emergency" today and requested the patient call the clinic tomorrow if they do not receive a call back with apt.

## 2014-10-10 ENCOUNTER — Ambulatory Visit: Payer: Medicare Other | Admitting: Physical Therapy

## 2014-10-10 ENCOUNTER — Ambulatory Visit: Payer: Medicare Other | Admitting: Occupational Therapy

## 2014-10-10 ENCOUNTER — Encounter: Payer: Self-pay | Admitting: Physical Therapy

## 2014-10-10 DIAGNOSIS — Z7409 Other reduced mobility: Secondary | ICD-10-CM | POA: Diagnosis not present

## 2014-10-10 DIAGNOSIS — R2689 Other abnormalities of gait and mobility: Secondary | ICD-10-CM

## 2014-10-10 DIAGNOSIS — Z89511 Acquired absence of right leg below knee: Secondary | ICD-10-CM

## 2014-10-10 DIAGNOSIS — R531 Weakness: Secondary | ICD-10-CM | POA: Diagnosis not present

## 2014-10-10 DIAGNOSIS — R5381 Other malaise: Secondary | ICD-10-CM

## 2014-10-10 DIAGNOSIS — Z89512 Acquired absence of left leg below knee: Secondary | ICD-10-CM

## 2014-10-10 DIAGNOSIS — R4189 Other symptoms and signs involving cognitive functions and awareness: Secondary | ICD-10-CM | POA: Diagnosis not present

## 2014-10-10 DIAGNOSIS — R269 Unspecified abnormalities of gait and mobility: Secondary | ICD-10-CM

## 2014-10-10 NOTE — Therapy (Signed)
[facility] [address] [place], [place], [address] Phone: [phone]   Fax:  [phone]  Occupational Therapy Treatment  Patient Details  Name: Oscar Castillo MRN: [id number] Date of Birth: [birth year] Referring Provider:  [name], MD  Encounter Date: 10/10/2014      OT End of Session - 10/10/14 1303    Visit Number 11   Number of Visits 16   Date for OT Re-Evaluation 10/29/14   Authorization Type medicare    Authorization - Visit Number 11   Authorization - Number of Visits 20   OT Start Time 1150   OT Stop Time 1210   OT Time Calculation (min) 20 min   Activity Tolerance Patient limited by pain   Behavior During Therapy Restless      Past Medical History  Diagnosis Date  . ESRD on hemodialysis 05/05/2007    ESRD due to DM/HTN. Started dialysis in November 2013.  HD TTS at [facility] on [place].  [name] BACK PAIN, LUMBAR, CHRONIC 08/06/2009  . BENIGN PROSTATIC HYPERTROPHY 08/01/2010  . CEREBROVASCULAR ACCIDENT, HX OF 08/06/2009  . CHOLELITHIASIS 08/01/2010  . CONGESTIVE HEART FAILURE 03/18/2009  . DEPRESSION 03/18/2009  . DIABETES MELLITUS, TYPE II 03/25/2007  . ERECTILE DYSFUNCTION 03/25/2007  . GERD 03/25/2007  . HEPATITIS C, HX OF 03/25/2007  . HYPERTENSION 03/25/2007  . Morbid obesity 03/25/2007  . NEPHROLITHIASIS, HX OF 03/18/2009  . Complication of anesthesia     wife states pt had trouble waking up with his last surgery in Nov., 2014    Past Surgical History  Procedure Laterality Date  . Nephrectomy      partial RR  . Av fistula placement  06/14/2012    Procedure: ARTERIOVENOUS (AV) FISTULA CREATION;  Surgeon: Angelia Mould, MD;  Location: [facility] OR;  Service: Vascular;  Laterality: Left;  Left basilic vein transposition with fistula.  . Tibia im nail insertion Left 09/09/2012    Procedure: INTRAMEDULLARY (IM) NAIL TIBIAL;  Surgeon: Johnny Bridge, MD;  Location: [place];  Service: Orthopedics;   Laterality: Left;  left tibial nail and open reduction internal fixation left fibula fracture  . Orif fibula fracture Left 09/09/2012    Procedure: OPEN REDUCTION INTERNAL FIXATION (ORIF) FIBULA FRACTURE;  Surgeon: Johnny Bridge, MD;  Location: [place];  Service: Orthopedics;  Laterality: Left;  . Colonoscopy N/A 10/28/2012    Procedure: COLONOSCOPY;  Surgeon: Jeryl Columbia, MD;  Location: [facility] ENDOSCOPY;  Service: Endoscopy;  Laterality: N/A;  . Esophagogastroduodenoscopy N/A 11/02/2012    Procedure: ESOPHAGOGASTRODUODENOSCOPY (EGD);  Surgeon: Cleotis Nipper, MD;  Location: [facility] ENDOSCOPY;  Service: Endoscopy;  Laterality: N/A;  . Colonoscopy N/A 11/02/2012    Procedure: COLONOSCOPY;  Surgeon: Cleotis Nipper, MD;  Location: [facility] ENDOSCOPY;  Service: Endoscopy;  Laterality: N/A;  . Colonoscopy N/A 11/03/2012    Procedure: COLONOSCOPY;  Surgeon: Cleotis Nipper, MD;  Location: [facility] ENDOSCOPY;  Service: Endoscopy;  Laterality: N/A;  . Givens capsule study N/A 11/04/2012    Procedure: GIVENS CAPSULE STUDY;  Surgeon: Cleotis Nipper, MD;  Location: [facility] ENDOSCOPY;  Service: Endoscopy;  Laterality: N/A;  . Enteroscopy N/A 11/08/2012    Procedure: ENTEROSCOPY;  Surgeon: Wonda Horner, MD;  Location: [facility] ENDOSCOPY;  Service: Endoscopy;  Laterality: N/A;  . Amputation Left 05/12/2013    Procedure: AMPUTATION RAY;  Surgeon: Newt Minion, MD;  Location: [place];  Service: Orthopedics;  Laterality: Left;  Left Foot 1st Ray  Amputation  . Eye surgery Left     to remove scar tissue  . Amputation Left 06/09/2013    Procedure: AMPUTATION BELOW KNEE;  Surgeon: Newt Minion, MD;  Location: [place];  Service: Orthopedics;  Laterality: Left;  Left Below Knee Amputation and removal proximal screws IM tibial nail  . Hardware removal Left 06/09/2013    Procedure: HARDWARE REMOVAL;  Surgeon: Newt Minion, MD;  Location: [place];  Service: Orthopedics;  Laterality: Left;  Left Below Knee Amputation  and Removal proximal screws IM tibial  nail  . Amputation Right 09/08/2013    Procedure: AMPUTATION BELOW KNEE;  Surgeon: Newt Minion, MD;  Location: [place];  Service: Orthopedics;  Laterality: Right;  Right Below Knee Amputation  . Amputation Right 10/11/2013    Procedure: AMPUTATION BELOW KNEE;  Surgeon: Newt Minion, MD;  Location: [place];  Service: Orthopedics;  Laterality: Right;  Right Below Knee Amputation Revision    There were no vitals filed for this visit.  Visit Diagnosis:  Generalized weakness  Debility      Subjective Assessment - 10/10/14 1259    Symptoms Pt reports significant pain today   Pertinent History see epic snapshot   Currently in Pain? Yes   Pain Score 8    Pain Location Shoulder   Pain Orientation Right;Left   Pain Descriptors / Indicators Aching   Pain Onset Today   Pain Frequency Intermittent   Aggravating Factors  pushing up   Pain Relieving Factors unknown   Multiple Pain Sites Yes   Pain Score 8   Pain Type Acute pain   Pain Location --  legs and generalized   Pain Frequency Constant   Pain Onset With Activity         Treatment: Pt with significant c/o pain today, shoulders, knees, generalized. Pt requested to lay down at beginning of session. Therapist transferred pt by scooting with min-mod A. Mod A sit-supine. Pt was positioned in supine with hot packs on both shoulders x 5 mins, and bolster under knees for comfort. Pt continued to complain of significant pain. Pt was returned to w/c scoot transfer min A and session was concluded early as pt was unable to fully participate due to pain. Therapist recommended pt consider his prescribed pain meds in the future so that he better can participate.                      OT Short Term Goals - 10/08/14 1247    OT SHORT TERM GOAL #1   Title Pt and wife will be mod I with HEP - 10/01/2014   Status Achieved   OT SHORT TERM GOAL #2   Title Pt will demonstrate increased grip strength bilaterally combined by at least 5 pounds  (baseline = R= 38 pounds, L= 35 pounds)  R= 60 pounds  L= 45   Status Achieved   OT SHORT TERM GOAL #3   Title Pt will demonstrate ability to  be min a with UB bathing at either bed or w/c level   Status Achieved   OT SHORT TERM GOAL #4   Title Pt will demonstrate ability to dress UB with mod a at either bed or w/c level  wheelchair level   Status Achieved   OT SHORT TERM GOAL #5   Title Pt will be supervision for sitting balance with one UE support for at least 5 minutes of a task.   Status Achieved  OT Long Term Goals - 10/08/14 1247    OT LONG TERM GOAL #1   Title Pt and wife will be mod I with upgraded HEP - 10/29/2014   Status On-going   OT LONG TERM GOAL #2   Title Pt will demonstrate ability to be  be supervision with UB bathing w/c level   Status Achieved   OT LONG TERM GOAL #3   Title Pt will demonstrate ability to be min a with LB bathing    Status On-going   OT LONG TERM GOAL #4   Title Pt will demonstrate ability to dress UB with supervision at w/c level   Status Achieved   OT LONG TERM GOAL #5   Status On-going               Plan - 10/10/14 1303    Clinical Impression Statement Pt progress and ability to participate was limited by significant pain   Plan UE strength, transfers as able.   Consulted and Agree with Plan of Care Patient;Family member/caregiver   Family Member Consulted wife        Problem List Patient Active Problem List   Diagnosis Date Noted  . Diarrhea 08/14/2014  . Dehydration 10/02/2013  . Fever 10/02/2013  . Altered mental status 10/01/2013  . Encephalopathy, toxic 10/01/2013  . Encephalopathy, metabolic 97/98/9211  . CVA (cerebral vascular accident) 09/28/2013  . FTT (failure to thrive) in adult 09/28/2013  . Acute confusional state 09/28/2013  . Ulcer of sacral region, stage 3 09/26/2013  . S/P BKA (below knee amputation) bilateral 09/08/2013  . ESRD on hemodialysis 05/09/2013  . TIA (transient ischemic  attack) 02/20/2013  . Acute blood loss anemia 10/28/2012  . Chronic combined systolic (EF 94%) and grade 2diastolic congestive heart failure 10/28/2012  . Obstipation 10/28/2012  . GI bleed 10/27/2012  . Mass in rectum 10/27/2012  . DM (diabetes mellitus) type I controlled with renal manifestation 09/08/2012  . LVH (left ventricular hypertrophy)-severe concentric 06/13/2012  . Anemia due to chronic illness 06/12/2012  . Hyperlipidemia 01/30/2011  . CHOLELITHIASIS 08/01/2010  . BENIGN PROSTATIC HYPERTROPHY 08/01/2010  . CEREBROVASCULAR ACCIDENT, HX OF 08/06/2009  . Depression 03/18/2009  . Acute combined systolic and diastolic heart failure, NYHA class 2-EF 45% 03/18/2009  . NEPHROLITHIASIS, HX OF 03/18/2009  . Morbid obesity 03/25/2007  . Essential hypertension 03/25/2007  . GERD 03/25/2007  . HEPATITIS C, HX OF 03/25/2007    RINE,KATHRYN 10/10/2014, 1:05 PM Theone Murdoch, OTR/L [phone] Phone: [phone] 1:05 PM 10/10/2014 [facility] [address] [place], [place], [address] Phone: [phone]   Fax:  [phone]

## 2014-10-10 NOTE — Therapy (Signed)
[facility] [address] [place], [place], [address] Phone: [phone]   Fax:  [phone]  Physical Therapy Treatment  Patient Details  Name: Oscar Castillo MRN: [id number] Date of Birth: [birth year] Referring Provider:  [name], MD  Encounter Date: 10/10/2014      PT End of Session - 10/10/14 1203    Visit Number 11   Number of Visits 34   Date for PT Re-Evaluation 11/02/14   PT Start Time 1105   PT Stop Time 1145   PT Time Calculation (min) 40 min   Equipment Utilized During Treatment Gait belt   Activity Tolerance Patient tolerated treatment well   Behavior During Therapy [facility] for tasks assessed/performed      Past Medical History  Diagnosis Date  . ESRD on hemodialysis 05/05/2007    ESRD due to DM/HTN. Started dialysis in November 2013.  HD TTS at [facility] on [place] [place].  [name] BACK PAIN, LUMBAR, CHRONIC 08/06/2009  . BENIGN PROSTATIC HYPERTROPHY 08/01/2010  . CEREBROVASCULAR ACCIDENT, HX OF 08/06/2009  . CHOLELITHIASIS 08/01/2010  . CONGESTIVE HEART FAILURE 03/18/2009  . DEPRESSION 03/18/2009  . DIABETES MELLITUS, TYPE II 03/25/2007  . ERECTILE DYSFUNCTION 03/25/2007  . GERD 03/25/2007  . HEPATITIS C, HX OF 03/25/2007  . HYPERTENSION 03/25/2007  . Morbid obesity 03/25/2007  . NEPHROLITHIASIS, HX OF 03/18/2009  . Complication of anesthesia     wife states pt had trouble waking up with his last surgery in Nov., 2014    Past Surgical History  Procedure Laterality Date  . Nephrectomy      partial RR  . Av fistula placement  06/14/2012    Procedure: ARTERIOVENOUS (AV) FISTULA CREATION;  Surgeon: Angelia Mould, MD;  Location: [facility] OR;  Service: Vascular;  Laterality: Left;  Left basilic vein transposition with fistula.  . Tibia im nail insertion Left 09/09/2012    Procedure: INTRAMEDULLARY (IM) NAIL TIBIAL;  Surgeon: Johnny Bridge, MD;  Location: [place];  Service: Orthopedics;  Laterality: Left;  left  tibial nail and open reduction internal fixation left fibula fracture  . Orif fibula fracture Left 09/09/2012    Procedure: OPEN REDUCTION INTERNAL FIXATION (ORIF) FIBULA FRACTURE;  Surgeon: Johnny Bridge, MD;  Location: [place];  Service: Orthopedics;  Laterality: Left;  . Colonoscopy N/A 10/28/2012    Procedure: COLONOSCOPY;  Surgeon: Jeryl Columbia, MD;  Location: [facility] ENDOSCOPY;  Service: Endoscopy;  Laterality: N/A;  . Esophagogastroduodenoscopy N/A 11/02/2012    Procedure: ESOPHAGOGASTRODUODENOSCOPY (EGD);  Surgeon: Cleotis Nipper, MD;  Location: [facility] ENDOSCOPY;  Service: Endoscopy;  Laterality: N/A;  . Colonoscopy N/A 11/02/2012    Procedure: COLONOSCOPY;  Surgeon: Cleotis Nipper, MD;  Location: [facility] ENDOSCOPY;  Service: Endoscopy;  Laterality: N/A;  . Colonoscopy N/A 11/03/2012    Procedure: COLONOSCOPY;  Surgeon: Cleotis Nipper, MD;  Location: [facility] ENDOSCOPY;  Service: Endoscopy;  Laterality: N/A;  . Givens capsule study N/A 11/04/2012    Procedure: GIVENS CAPSULE STUDY;  Surgeon: Cleotis Nipper, MD;  Location: [facility] ENDOSCOPY;  Service: Endoscopy;  Laterality: N/A;  . Enteroscopy N/A 11/08/2012    Procedure: ENTEROSCOPY;  Surgeon: Wonda Horner, MD;  Location: [facility] ENDOSCOPY;  Service: Endoscopy;  Laterality: N/A;  . Amputation Left 05/12/2013    Procedure: AMPUTATION RAY;  Surgeon: Newt Minion, MD;  Location: [place];  Service: Orthopedics;  Laterality: Left;  Left Foot 1st Ray Amputation  . Eye surgery Left  to remove scar tissue  . Amputation Left 06/09/2013    Procedure: AMPUTATION BELOW KNEE;  Surgeon: Newt Minion, MD;  Location: [place];  Service: Orthopedics;  Laterality: Left;  Left Below Knee Amputation and removal proximal screws IM tibial nail  . Hardware removal Left 06/09/2013    Procedure: HARDWARE REMOVAL;  Surgeon: Newt Minion, MD;  Location: [place];  Service: Orthopedics;  Laterality: Left;  Left Below Knee Amputation  and Removal proximal screws IM tibial nail  . Amputation  Right 09/08/2013    Procedure: AMPUTATION BELOW KNEE;  Surgeon: Newt Minion, MD;  Location: [place];  Service: Orthopedics;  Laterality: Right;  Right Below Knee Amputation  . Amputation Right 10/11/2013    Procedure: AMPUTATION BELOW KNEE;  Surgeon: Newt Minion, MD;  Location: [place];  Service: Orthopedics;  Laterality: Right;  Right Below Knee Amputation Revision    There were no vitals filed for this visit.  Visit Diagnosis:  Generalized weakness  Impaired functional mobility and activity tolerance  Balance problems  Status post bilateral below knee amputation  Abnormality of gait      Subjective Assessment - 10/10/14 1109    Symptoms dialysis was rough yesterday with lots of BP flucuations.   Currently in Pain? Yes   Pain Score 8    Pain Location Shoulder   Pain Orientation Left;Right   Pain Descriptors / Indicators Aching;Tightness   Pain Onset More than a month ago   Pain Frequency Intermittent                       [facility] Adult PT Treatment/Exercise - 10/10/14 1100    Transfers   Sit to Stand 3: Mod assist;1: +2 Total assist;From elevated surface;With upper extremity assist;With armrests;From chair/3-in-1  from 22" w/c to RW   Sit to Stand Details (indicate cue type and reason) demo /instructed in technique   Stand to Sit 3: Mod assist;With upper extremity assist;With armrests;To chair/3-in-1  RW to 22" w/c   Squat Pivot Transfers 4: Min assist;With upper extremity assistance  armrest removed toward direction transfering   Ambulation/Gait   Ambulation/Gait Yes   Ambulation/Gait Assistance 1: +2 Total assist  3 people, 1 person on each knee to control flexion & RW   Ambulation Distance (Feet) 3 Feet   Assistive device Rolling walker;Prostheses   Gait Pattern Trunk flexed;Left flexed knee in stance;Right flexed knee in stance;Decreased stride length   Ambulation Surface Level;Indoor   Static Standing Balance   Static Standing - Balance Support  Bilateral upper extremity supported  RW   Static Standing - Level of Assistance 3: Mod assist   Static Standing - Comment/# of Minutes 15 sec  4X   Knee/Hip Exercises: Aerobic   Stationary Bike NuStep Level 3 with BUEs & BLEs 3 minutes  cues on ROM   Prosthetics   Current prosthetic wear tolerance (days/week)  7 days   Current prosthetic wear tolerance (#hours/day)  >80% of out of bed hours   Education Provided Proper wear schedule/adjustment  donning   Person(s) Educated Patient;Spouse   Education Method Explanation   Education Method Verbalized understanding   Donning Prosthesis Minimal assist   Doffing Prosthesis Minimal assist                PT Education - 10/10/14 1202    Education provided Yes   Education Details dialysis and energy level associated with fluid changes / compliance with diet   Person(s) Educated  Patient;Spouse   Methods Explanation   Comprehension Verbalized understanding          PT Short Term Goals - 10/01/14 1415    PT SHORT TERM GOAL #1   Title donnes prostheses with minimal assist (Target Date: 10/02/14)   Time 1   Period Months   Status Partially Met   PT SHORT TERM GOAL #2   Title wife & patient verbalize adjusting wear schedule to maximize functional potential. (Target Date: 10/02/14)   Time 1   Period Months   Status Achieved   PT SHORT TERM GOAL #3   Title performs squat-pivot transfer w/c to level mat with minimal assist. (Target Date: 10/02/14)   Baseline MET 09/26/14   Time 1   Period Months   Status Achieved   PT SHORT TERM GOAL #4   Title Sit to/from stand w/c to parallel bars with moderate assist (Target Date: 10/02/14)   Baseline MET 09/26/14   Time 1   Period Months   Status Achieved   PT SHORT TERM GOAL #5   Title patient & wife demonstrate initial HEP correctly. (Target Date: 10/02/14)   Baseline MET 09/21/14   Time 1   Period Months   Status Achieved   Additional Short Term Goals   Additional Short Term Goals Yes   PT  SHORT TERM GOAL #6   Title reports wear of prostheses daily >80% of awake hours. (Target Date: 11/02/14)   Time 30   Period Days   Status New   PT SHORT TERM GOAL #7   Title Squat-pivot transfer with supervision. (Target Date: 11/02/14)   Time 30   Period Days   Status New   PT SHORT TERM GOAL #8   Title Sit to / from stand w/c to RW with moderate assist. (Target Date: 11/02/14)   Time 30   Period Days   Status New           PT Long Term Goals - 09/03/14 1015    PT LONG TERM GOAL #1   Title Patient and wife demonstrate proper prosthetic care. (Target Date: 01/02/15)   Time 4   Period Months   Status New   PT LONG TERM GOAL #2   Title tolerates wear of prostheses >90% of awake hours except at dialysis (Target Date: 01/02/15)   Time 4   Period Months   Status New   PT LONG TERM GOAL #3   Title stand pivot transfers with rolling walker & prostheses modified independent.  (Target Date: 01/02/15)   Time 4   Period Months   Status New   PT LONG TERM GOAL #4   Title manages clothes, reaches 5" and in upper /lower cabinets in standing with UE support modified independent.  (Target Date: 01/02/15)   Time 4   Period Months   Status New   PT LONG TERM GOAL #5   Title ambulate 68' with rolling walker & prostheses with minimal assist.  (Target Date: 01/02/15)   Time 4   Period Months   Status New               Plan - 10/10/14 1203    Clinical Impression Statement Patient was fatigued more today from dialysis than previous sessions and his shoulders hurt more. He still participated in session with maximal effort.    Pt will benefit from skilled therapeutic intervention in order to improve on the following deficits Abnormal gait;Decreased activity tolerance;Decreased balance;Decreased endurance;Decreased knowledge of use of DME;Decreased  mobility;Decreased range of motion;Decreased strength;Difficulty walking;Other (comment)  prosthetic dependency   Rehab Potential Good   Clinical  Impairments Affecting Rehab Potential multiple medical issues over last year causing decondtioning   PT Frequency 2x / week   PT Duration Other (comment)  4 months   PT Treatment/Interventions ADLs/Self Care Home Management;Gait training;Stair training;DME Instruction;Functional mobility training;Therapeutic activities;Therapeutic exercise;Balance training;Neuromuscular re-education;Patient/family education;Manual techniques;Other (comment)  prosthetic training   PT Next Visit Plan transfers, standing with rolling walker   Consulted and Agree with Plan of Care Patient;Family member/caregiver   Family Member Consulted wife        Problem List Patient Active Problem List   Diagnosis Date Noted  . Diarrhea 08/14/2014  . Dehydration 10/02/2013  . Fever 10/02/2013  . Altered mental status 10/01/2013  . Encephalopathy, toxic 10/01/2013  . Encephalopathy, metabolic 93/26/7124  . CVA (cerebral vascular accident) 09/28/2013  . FTT (failure to thrive) in adult 09/28/2013  . Acute confusional state 09/28/2013  . Ulcer of sacral region, stage 3 09/26/2013  . S/P BKA (below knee amputation) bilateral 09/08/2013  . ESRD on hemodialysis 05/09/2013  . TIA (transient ischemic attack) 02/20/2013  . Acute blood loss anemia 10/28/2012  . Chronic combined systolic (EF 58%) and grade 2diastolic congestive heart failure 10/28/2012  . Obstipation 10/28/2012  . GI bleed 10/27/2012  . Mass in rectum 10/27/2012  . DM (diabetes mellitus) type I controlled with renal manifestation 09/08/2012  . LVH (left ventricular hypertrophy)-severe concentric 06/13/2012  . Anemia due to chronic illness 06/12/2012  . Hyperlipidemia 01/30/2011  . CHOLELITHIASIS 08/01/2010  . BENIGN PROSTATIC HYPERTROPHY 08/01/2010  . CEREBROVASCULAR ACCIDENT, HX OF 08/06/2009  . Depression 03/18/2009  . Acute combined systolic and diastolic heart failure, NYHA class 2-EF 45% 03/18/2009  . NEPHROLITHIASIS, HX OF 03/18/2009  . Morbid  obesity 03/25/2007  . Essential hypertension 03/25/2007  . GERD 03/25/2007  . HEPATITIS C, HX OF 03/25/2007    Francille Wittmann PT, DPT 10/10/2014, 12:08 PM  [facility] [address] [place], [place], [address] Phone: [phone]   Fax:  [phone]

## 2014-10-11 DIAGNOSIS — E1122 Type 2 diabetes mellitus with diabetic chronic kidney disease: Secondary | ICD-10-CM | POA: Diagnosis not present

## 2014-10-11 DIAGNOSIS — N186 End stage renal disease: Secondary | ICD-10-CM | POA: Diagnosis not present

## 2014-10-11 DIAGNOSIS — D509 Iron deficiency anemia, unspecified: Secondary | ICD-10-CM | POA: Diagnosis not present

## 2014-10-11 DIAGNOSIS — N2581 Secondary hyperparathyroidism of renal origin: Secondary | ICD-10-CM | POA: Diagnosis not present

## 2014-10-13 DIAGNOSIS — N186 End stage renal disease: Secondary | ICD-10-CM | POA: Diagnosis not present

## 2014-10-13 DIAGNOSIS — E1122 Type 2 diabetes mellitus with diabetic chronic kidney disease: Secondary | ICD-10-CM | POA: Diagnosis not present

## 2014-10-13 DIAGNOSIS — N2581 Secondary hyperparathyroidism of renal origin: Secondary | ICD-10-CM | POA: Diagnosis not present

## 2014-10-13 DIAGNOSIS — D509 Iron deficiency anemia, unspecified: Secondary | ICD-10-CM | POA: Diagnosis not present

## 2014-10-15 ENCOUNTER — Ambulatory Visit: Payer: Medicare Other | Admitting: Physical Therapy

## 2014-10-15 ENCOUNTER — Encounter: Payer: Self-pay | Admitting: Physical Therapy

## 2014-10-15 ENCOUNTER — Ambulatory Visit: Payer: Medicare Other | Admitting: Occupational Therapy

## 2014-10-15 ENCOUNTER — Encounter: Payer: Self-pay | Admitting: Occupational Therapy

## 2014-10-15 DIAGNOSIS — R5381 Other malaise: Secondary | ICD-10-CM | POA: Diagnosis not present

## 2014-10-15 DIAGNOSIS — Z89512 Acquired absence of left leg below knee: Secondary | ICD-10-CM

## 2014-10-15 DIAGNOSIS — Z89511 Acquired absence of right leg below knee: Secondary | ICD-10-CM | POA: Diagnosis not present

## 2014-10-15 DIAGNOSIS — R531 Weakness: Secondary | ICD-10-CM

## 2014-10-15 DIAGNOSIS — Z7409 Other reduced mobility: Secondary | ICD-10-CM

## 2014-10-15 DIAGNOSIS — R4189 Other symptoms and signs involving cognitive functions and awareness: Secondary | ICD-10-CM | POA: Diagnosis not present

## 2014-10-15 DIAGNOSIS — R2689 Other abnormalities of gait and mobility: Secondary | ICD-10-CM

## 2014-10-15 NOTE — Therapy (Signed)
[facility] [address] [place], [place], [address] Phone: [phone]   Fax:  [phone]  Physical Therapy Treatment  Patient Details  Name: Oscar Castillo MRN: [id number] Date of Birth: [birth year] Referring Provider:  [name], MD  Encounter Date: 10/15/2014      PT End of Session - 10/15/14 1100    Visit Number 12   Number of Visits 34   Date for PT Re-Evaluation 11/02/14   PT Start Time 1100   PT Stop Time 1145   PT Time Calculation (min) 45 min   Equipment Utilized During Treatment Gait belt   Activity Tolerance Patient tolerated treatment well   Behavior During Therapy [facility] for tasks assessed/performed      Past Medical History  Diagnosis Date  . ESRD on hemodialysis 05/05/2007    ESRD due to DM/HTN. Started dialysis in November 2013.  HD TTS at [facility] on [address].  [name] BACK PAIN, LUMBAR, CHRONIC 08/06/2009  . BENIGN PROSTATIC HYPERTROPHY 08/01/2010  . CEREBROVASCULAR ACCIDENT, HX OF 08/06/2009  . CHOLELITHIASIS 08/01/2010  . CONGESTIVE HEART FAILURE 03/18/2009  . DEPRESSION 03/18/2009  . DIABETES MELLITUS, TYPE II 03/25/2007  . ERECTILE DYSFUNCTION 03/25/2007  . GERD 03/25/2007  . HEPATITIS C, HX OF 03/25/2007  . HYPERTENSION 03/25/2007  . Morbid obesity 03/25/2007  . NEPHROLITHIASIS, HX OF 03/18/2009  . Complication of anesthesia     wife states pt had trouble waking up with his last surgery in Nov., 2014    Past Surgical History  Procedure Laterality Date  . Nephrectomy      partial RR  . Av fistula placement  06/14/2012    Procedure: ARTERIOVENOUS (AV) FISTULA CREATION;  Surgeon: Angelia Mould, MD;  Location: [facility] OR;  Service: Vascular;  Laterality: Left;  Left basilic vein transposition with fistula.  . Tibia im nail insertion Left 09/09/2012    Procedure: INTRAMEDULLARY (IM) NAIL TIBIAL;  Surgeon: Johnny Bridge, MD;  Location: [place];  Service: Orthopedics;  Laterality: Left;  left  tibial nail and open reduction internal fixation left fibula fracture  . Orif fibula fracture Left 09/09/2012    Procedure: OPEN REDUCTION INTERNAL FIXATION (ORIF) FIBULA FRACTURE;  Surgeon: Johnny Bridge, MD;  Location: [place];  Service: Orthopedics;  Laterality: Left;  . Colonoscopy N/A 10/28/2012    Procedure: COLONOSCOPY;  Surgeon: Jeryl Columbia, MD;  Location: [facility] ENDOSCOPY;  Service: Endoscopy;  Laterality: N/A;  . Esophagogastroduodenoscopy N/A 11/02/2012    Procedure: ESOPHAGOGASTRODUODENOSCOPY (EGD);  Surgeon: Cleotis Nipper, MD;  Location: [facility] ENDOSCOPY;  Service: Endoscopy;  Laterality: N/A;  . Colonoscopy N/A 11/02/2012    Procedure: COLONOSCOPY;  Surgeon: Cleotis Nipper, MD;  Location: [facility] ENDOSCOPY;  Service: Endoscopy;  Laterality: N/A;  . Colonoscopy N/A 11/03/2012    Procedure: COLONOSCOPY;  Surgeon: Cleotis Nipper, MD;  Location: [facility] ENDOSCOPY;  Service: Endoscopy;  Laterality: N/A;  . Givens capsule study N/A 11/04/2012    Procedure: GIVENS CAPSULE STUDY;  Surgeon: Cleotis Nipper, MD;  Location: [facility] ENDOSCOPY;  Service: Endoscopy;  Laterality: N/A;  . Enteroscopy N/A 11/08/2012    Procedure: ENTEROSCOPY;  Surgeon: Wonda Horner, MD;  Location: [facility] ENDOSCOPY;  Service: Endoscopy;  Laterality: N/A;  . Amputation Left 05/12/2013    Procedure: AMPUTATION RAY;  Surgeon: Newt Minion, MD;  Location: [place];  Service: Orthopedics;  Laterality: Left;  Left Foot 1st Ray Amputation  . Eye surgery Left  to remove scar tissue  . Amputation Left 06/09/2013    Procedure: AMPUTATION BELOW KNEE;  Surgeon: Newt Minion, MD;  Location: [place];  Service: Orthopedics;  Laterality: Left;  Left Below Knee Amputation and removal proximal screws IM tibial nail  . Hardware removal Left 06/09/2013    Procedure: HARDWARE REMOVAL;  Surgeon: Newt Minion, MD;  Location: [place];  Service: Orthopedics;  Laterality: Left;  Left Below Knee Amputation  and Removal proximal screws IM tibial nail  . Amputation  Right 09/08/2013    Procedure: AMPUTATION BELOW KNEE;  Surgeon: Newt Minion, MD;  Location: [place];  Service: Orthopedics;  Laterality: Right;  Right Below Knee Amputation  . Amputation Right 10/11/2013    Procedure: AMPUTATION BELOW KNEE;  Surgeon: Newt Minion, MD;  Location: [place];  Service: Orthopedics;  Laterality: Right;  Right Below Knee Amputation Revision    There were no vitals filed for this visit.  Visit Diagnosis:  Generalized weakness  Impaired functional mobility and activity tolerance  Balance problems  Status post bilateral below knee amputation      Subjective Assessment - 10/15/14 1114    Symptoms wore prostheses 6-7 hours yesterday.    Currently in Pain? Yes   Pain Score 7    Pain Location Shoulder                       [facility] Adult PT Treatment/Exercise - 10/15/14 1100    Transfers   Sit to Stand 1: +2 Total assist;From elevated surface;With upper extremity assist;With armrests;From chair/3-in-1;3: Mod assist  from 22" w/c to RW +2 Total assist, From 24" bar with parall   Sit to Stand: Patient Percentage 30%   Sit to Stand Details (indicate cue type and reason) cues on technique   Stand to Sit 3: Mod assist;With upper extremity assist;With armrests;To chair/3-in-1  RW to 22" w/c    Squat Pivot Transfers 4: Min assist;With upper extremity assistance  armrest removed toward direction transfering   Ambulation/Gait   Ambulation/Gait No   Ambulation/Gait Assistance --   Ambulation Distance (Feet) --   Assistive device --   Gait Pattern --   Static Standing Balance   Static Standing - Balance Support Bilateral upper extremity supported  RW   Static Standing - Level of Assistance 3: Mod assist   Static Standing - Comment/# of Minutes 20  4 x holding RW & 5X in parallel bars.   Dynamic Standing Balance   Dynamic Standing - Balance Support No upper extremity supported  sitting on 24" bar stool in parallel bars   Dynamic Standing - Level of  Assistance 4: Min assist   Dynamic Standing - Balance Activities Reaching for objects  red theraband reciprocal & BUE row, overhead reach & forward   Knee/Hip Exercises: Stretches   Passive Hamstring Stretch 3 reps;20 seconds  seated on 24" stool using Prostretch with PT performing pres   Knee/Hip Exercises: Aerobic   Stationary Bike NuStep Level 4 with BUEs & BLEs 5 minutes  cues on ROM   Prosthetics   Current prosthetic wear tolerance (days/week)  7 days   Current prosthetic wear tolerance (#hours/day)  >80% of out of bed hours                PT Education - 10/15/14 1224    Education provided Yes   Education Details increase wear of prostheses to all awake hours to increase function & decrease risk of falls  Person(s) Educated Patient;Spouse   Methods Explanation   Comprehension Verbalized understanding          PT Short Term Goals - 10/01/14 1415    PT SHORT TERM GOAL #1   Title donnes prostheses with minimal assist (Target Date: 10/02/14)   Time 1   Period Months   Status Partially Met   PT SHORT TERM GOAL #2   Title wife & patient verbalize adjusting wear schedule to maximize functional potential. (Target Date: 10/02/14)   Time 1   Period Months   Status Achieved   PT SHORT TERM GOAL #3   Title performs squat-pivot transfer w/c to level mat with minimal assist. (Target Date: 10/02/14)   Baseline MET 09/26/14   Time 1   Period Months   Status Achieved   PT SHORT TERM GOAL #4   Title Sit to/from stand w/c to parallel bars with moderate assist (Target Date: 10/02/14)   Baseline MET 09/26/14   Time 1   Period Months   Status Achieved   PT SHORT TERM GOAL #5   Title patient & wife demonstrate initial HEP correctly. (Target Date: 10/02/14)   Baseline MET 09/21/14   Time 1   Period Months   Status Achieved   Additional Short Term Goals   Additional Short Term Goals Yes   PT SHORT TERM GOAL #6   Title reports wear of prostheses daily >80% of awake hours. (Target Date:  11/02/14)   Time 30   Period Days   Status New   PT SHORT TERM GOAL #7   Title Squat-pivot transfer with supervision. (Target Date: 11/02/14)   Time 30   Period Days   Status New   PT SHORT TERM GOAL #8   Title Sit to / from stand w/c to RW with moderate assist. (Target Date: 11/02/14)   Time 30   Period Days   Status New           PT Long Term Goals - 09/03/14 1015    PT LONG TERM GOAL #1   Title Patient and wife demonstrate proper prosthetic care. (Target Date: 01/02/15)   Time 4   Period Months   Status New   PT LONG TERM GOAL #2   Title tolerates wear of prostheses >90% of awake hours except at dialysis (Target Date: 01/02/15)   Time 4   Period Months   Status New   PT LONG TERM GOAL #3   Title stand pivot transfers with rolling walker & prostheses modified independent.  (Target Date: 01/02/15)   Time 4   Period Months   Status New   PT LONG TERM GOAL #4   Title manages clothes, reaches 5" and in upper /lower cabinets in standing with UE support modified independent.  (Target Date: 01/02/15)   Time 4   Period Months   Status New   PT LONG TERM GOAL #5   Title ambulate 60' with rolling walker & prostheses with minimal assist.  (Target Date: 01/02/15)   Time 4   Period Months   Status New               Plan - 10/15/14 1100    Clinical Impression Statement Patient had better endurance today and tolerated more activities. Patient was able to extend knees in standing more after PT stretched his hamstrings / increase knee ext.   Pt will benefit from skilled therapeutic intervention in order to improve on the following deficits Abnormal gait;Decreased activity tolerance;Decreased balance;Decreased endurance;Decreased  knowledge of use of DME;Decreased mobility;Decreased range of motion;Decreased strength;Difficulty walking;Other (comment)  prosthetic dependency   Rehab Potential Good   Clinical Impairments Affecting Rehab Potential multiple medical issues over last year  causing decondtioning   PT Frequency 2x / week   PT Duration Other (comment)  4 months   PT Treatment/Interventions ADLs/Self Care Home Management;Gait training;Stair training;DME Instruction;Functional mobility training;Therapeutic activities;Therapeutic exercise;Balance training;Neuromuscular re-education;Patient/family education;Manual techniques;Other (comment)  prosthetic training   PT Next Visit Plan transfers, standing with rolling walker   Consulted and Agree with Plan of Care Patient;Family member/caregiver   Family Member Consulted wife        Problem List Patient Active Problem List   Diagnosis Date Noted  . Diarrhea 08/14/2014  . Dehydration 10/02/2013  . Fever 10/02/2013  . Altered mental status 10/01/2013  . Encephalopathy, toxic 10/01/2013  . Encephalopathy, metabolic 94/85/4627  . CVA (cerebral vascular accident) 09/28/2013  . FTT (failure to thrive) in adult 09/28/2013  . Acute confusional state 09/28/2013  . Ulcer of sacral region, stage 3 09/26/2013  . S/P BKA (below knee amputation) bilateral 09/08/2013  . ESRD on hemodialysis 05/09/2013  . TIA (transient ischemic attack) 02/20/2013  . Acute blood loss anemia 10/28/2012  . Chronic combined systolic (EF 03%) and grade 2diastolic congestive heart failure 10/28/2012  . Obstipation 10/28/2012  . GI bleed 10/27/2012  . Mass in rectum 10/27/2012  . DM (diabetes mellitus) type I controlled with renal manifestation 09/08/2012  . LVH (left ventricular hypertrophy)-severe concentric 06/13/2012  . Anemia due to chronic illness 06/12/2012  . Hyperlipidemia 01/30/2011  . CHOLELITHIASIS 08/01/2010  . BENIGN PROSTATIC HYPERTROPHY 08/01/2010  . CEREBROVASCULAR ACCIDENT, HX OF 08/06/2009  . Depression 03/18/2009  . Acute combined systolic and diastolic heart failure, NYHA class 2-EF 45% 03/18/2009  . NEPHROLITHIASIS, HX OF 03/18/2009  . Morbid obesity 03/25/2007  . Essential hypertension 03/25/2007  . GERD 03/25/2007   . HEPATITIS C, HX OF 03/25/2007    Jamey Reas PT, DPT 10/15/2014, 12:29 PM  [facility] [address] [place], [place], [address] Phone: [phone]   Fax:  [phone]

## 2014-10-15 NOTE — Therapy (Signed)
[facility] [address] [place], [place], [address] Phone: [phone]   Fax:  [phone]  Occupational Therapy Treatment  Patient Details  Name: Oscar Castillo MRN: [id number] Date of Birth: January 21, 1951 Referring Provider:  [name], MD  Encounter Date: 10/15/2014      OT End of Session - 10/15/14 1225    Visit Number 12   Number of Visits 16   Date for OT Re-Evaluation 10/29/14   Authorization Type medicare    OT Start Time 1150   OT Stop Time 1211  pt left early for funeral   OT Time Calculation (min) 21 min   Activity Tolerance Patient tolerated treatment well      Past Medical History  Diagnosis Date  . ESRD on hemodialysis 05/05/2007    ESRD due to DM/HTN. Started dialysis in November 2013.  HD TTS at [facility] on [place].  [name] BACK PAIN, LUMBAR, CHRONIC 08/06/2009  . BENIGN PROSTATIC HYPERTROPHY 08/01/2010  . CEREBROVASCULAR ACCIDENT, HX OF 08/06/2009  . CHOLELITHIASIS 08/01/2010  . CONGESTIVE HEART FAILURE 03/18/2009  . DEPRESSION 03/18/2009  . DIABETES MELLITUS, TYPE II 03/25/2007  . ERECTILE DYSFUNCTION 03/25/2007  . GERD 03/25/2007  . HEPATITIS C, HX OF 03/25/2007  . HYPERTENSION 03/25/2007  . Morbid obesity 03/25/2007  . NEPHROLITHIASIS, HX OF 03/18/2009  . Complication of anesthesia     wife states pt had trouble waking up with his last surgery in Nov., 2014    Past Surgical History  Procedure Laterality Date  . Nephrectomy      partial RR  . Av fistula placement  06/14/2012    Procedure: ARTERIOVENOUS (AV) FISTULA CREATION;  Surgeon: Angelia Mould, MD;  Location: [facility] OR;  Service: Vascular;  Laterality: Left;  Left basilic vein transposition with fistula.  . Tibia im nail insertion Left 09/09/2012    Procedure: INTRAMEDULLARY (IM) NAIL TIBIAL;  Surgeon: Johnny Bridge, MD;  Location: [facility];  Service: Orthopedics;  Laterality: Left;  left tibial nail and open reduction internal fixation  left fibula fracture  . Orif fibula fracture Left 09/09/2012    Procedure: OPEN REDUCTION INTERNAL FIXATION (ORIF) FIBULA FRACTURE;  Surgeon: Johnny Bridge, MD;  Location: [facility];  Service: Orthopedics;  Laterality: Left;  . Colonoscopy N/A 10/28/2012    Procedure: COLONOSCOPY;  Surgeon: Jeryl Columbia, MD;  Location: [facility] ENDOSCOPY;  Service: Endoscopy;  Laterality: N/A;  . Esophagogastroduodenoscopy N/A 11/02/2012    Procedure: ESOPHAGOGASTRODUODENOSCOPY (EGD);  Surgeon: Cleotis Nipper, MD;  Location: [facility] ENDOSCOPY;  Service: Endoscopy;  Laterality: N/A;  . Colonoscopy N/A 11/02/2012    Procedure: COLONOSCOPY;  Surgeon: Cleotis Nipper, MD;  Location: [facility] ENDOSCOPY;  Service: Endoscopy;  Laterality: N/A;  . Colonoscopy N/A 11/03/2012    Procedure: COLONOSCOPY;  Surgeon: Cleotis Nipper, MD;  Location: [facility] ENDOSCOPY;  Service: Endoscopy;  Laterality: N/A;  . Givens capsule study N/A 11/04/2012    Procedure: GIVENS CAPSULE STUDY;  Surgeon: Cleotis Nipper, MD;  Location: [facility] ENDOSCOPY;  Service: Endoscopy;  Laterality: N/A;  . Enteroscopy N/A 11/08/2012    Procedure: ENTEROSCOPY;  Surgeon: Wonda Horner, MD;  Location: [facility] ENDOSCOPY;  Service: Endoscopy;  Laterality: N/A;  . Amputation Left 05/12/2013    Procedure: AMPUTATION RAY;  Surgeon: Newt Minion, MD;  Location: [facility];  Service: Orthopedics;  Laterality: Left;  Left Foot 1st Ray Amputation  . Eye surgery Left     to remove scar tissue  .  Amputation Left 06/09/2013    Procedure: AMPUTATION BELOW KNEE;  Surgeon: Newt Minion, MD;  Location: [facility];  Service: Orthopedics;  Laterality: Left;  Left Below Knee Amputation and removal proximal screws IM tibial nail  . Hardware removal Left 06/09/2013    Procedure: HARDWARE REMOVAL;  Surgeon: Newt Minion, MD;  Location: [place];  Service: Orthopedics;  Laterality: Left;  Left Below Knee Amputation  and Removal proximal screws IM tibial nail  . Amputation Right 09/08/2013    Procedure: AMPUTATION BELOW KNEE;   Surgeon: Newt Minion, MD;  Location: [place];  Service: Orthopedics;  Laterality: Right;  Right Below Knee Amputation  . Amputation Right 10/11/2013    Procedure: AMPUTATION BELOW KNEE;  Surgeon: Newt Minion, MD;  Location: [facility] [place];  Service: Orthopedics;  Laterality: Right;  Right Below Knee Amputation Revision    There were no vitals filed for this visit.  Visit Diagnosis:  Generalized weakness      Subjective Assessment - 10/15/14 1154    Symptoms I have a funeral today so I have to leave early   Pertinent History see epic snapshot   Currently in Pain? Yes   Pain Score 7    Pain Location --  overall muscle soresness from working hard   Pain Descriptors / Indicators Aching   Pain Type Chronic pain   Pain Onset In the past 7 days   Pain Frequency Intermittent   Aggravating Factors  moving and working in therapy   Pain Relieving Factors unknown                    OT Treatments/Exercises (OP) - 10/15/14 1223    Shoulder Exercises: Seated   Other Seated Exercises Shoulder flexion, shoulder abduction and bicep curls with 1 pound weight 15 reps x2.  Reviewed LTG's with pt  who has met all goals. Discussed pt's limited ability tolerate activity and pt stated it is very hard for him to do more than one therapy a day and he cannot do therapy on dialysis days.  Given that pt has met all OT LTG's, pt to discharge from OT to focus on standing goals with pt. Pt and wife in agreement.                   OT Short Term Goals - 10/15/14 1227    OT SHORT TERM GOAL #1   Title Pt and wife will be mod I with HEP - 10/01/2014   Status Achieved   OT SHORT TERM GOAL #2   Title Pt will demonstrate increased grip strength bilaterally combined by at least 5 pounds (baseline = R= 38 pounds, L= 35 pounds)  R= 60 pounds  L= 45   Status Achieved   OT SHORT TERM GOAL #3   Title Pt will demonstrate ability to  be min a with UB bathing at either bed or w/c level   Status Achieved   OT  SHORT TERM GOAL #4   Title Pt will demonstrate ability to dress UB with mod a at either bed or w/c level  wheelchair level   Status Achieved   OT SHORT TERM GOAL #5   Title Pt will be supervision for sitting balance with one UE support for at least 5 minutes of a task.   Status Achieved           OT Long Term Goals - 10/15/14 1227    OT LONG TERM GOAL #1  Title Pt and wife will be mod I with upgraded HEP - 10/29/2014   Status Achieved   OT LONG TERM GOAL #2   Title Pt will demonstrate ability to be  be supervision with UB bathing w/c level   Status Achieved   OT LONG TERM GOAL #3   Title Pt will demonstrate ability to be min a with LB bathing    Status Achieved   OT LONG TERM GOAL #4   Title Pt will demonstrate ability to dress UB with supervision at w/c level   Status Achieved   OT LONG TERM GOAL #5   Status Achieved   OT LONG TERM GOAL #6   Status Achieved   OT LONG TERM GOAL #7   Status Partially Met  not consistently               Plan - Nov 08, 2014 1225    Clinical Impression Statement Pt has met all OT LTG's and wishes to focus his energy on PT goals at this point.    Pt will benefit from skilled therapeutic intervention in order to improve on the following deficits (Retired) Cardiopulmonary status limiting activity;Decreased activity tolerance;Decreased balance;Decreased cognition;Decreased mobility;Decreased knowledge of use of DME;Decreased knowledge of precautions;Decreased endurance;Decreased strength   Rehab Potential Fair   Clinical Impairments Affecting Rehab Potential Pt can benefit from skilled OT to address the above deficits.   OT Frequency 2x / week   OT Duration 8 weeks   OT Treatment/Interventions Self-care/ADL training;DME and/or AE instruction;Energy conservation;Therapeutic exercise;Therapist, nutritional;Therapeutic activities;Balance training;Patient/family education   Plan D/c from OT   Consulted and Agree with Plan of Care  Patient;Family member/caregiver   Family Member Consulted wife          G-Codes - 2014/11/08 October 21, 1230    Self Care Current Status [phone]) At least 20 percent but less than 40 percent impaired, limited or restricted   Self Care Goal Status (C3762) At least 60 percent but less than 80 percent impaired, limited or restricted      Problem List Patient Active Problem List   Diagnosis Date Noted  . Diarrhea 08/14/2014  . Dehydration 10/02/2013  . Fever 10/02/2013  . Altered mental status 10/01/2013  . Encephalopathy, toxic 10/01/2013  . Encephalopathy, metabolic 83/15/1761  . CVA (cerebral vascular accident) 09/28/2013  . FTT (failure to thrive) in adult 09/28/2013  . Acute confusional state 09/28/2013  . Ulcer of sacral region, stage 3 20-Oct-2013  . S/P BKA (below knee amputation) bilateral 09/08/2013  . ESRD on hemodialysis 05/09/2013  . TIA (transient ischemic attack) 02/20/2013  . Acute blood loss anemia 10/28/2012  . Chronic combined systolic (EF 60%) and grade 2diastolic congestive heart failure 10/28/2012  . Obstipation 10/28/2012  . GI bleed 10/27/2012  . Mass in rectum 10/27/2012  . DM (diabetes mellitus) type I controlled with renal manifestation 09/08/2012  . LVH (left ventricular hypertrophy)-severe concentric 06/13/2012  . Anemia due to chronic illness 06/12/2012  . Hyperlipidemia 01/30/2011  . CHOLELITHIASIS 08/01/2010  . BENIGN PROSTATIC HYPERTROPHY 08/01/2010  . CEREBROVASCULAR ACCIDENT, HX OF 08/06/2009  . Depression 03/18/2009  . Acute combined systolic and diastolic heart failure, NYHA class 2-EF 45% 03/18/2009  . NEPHROLITHIASIS, HX OF 03/18/2009  . Morbid obesity 03/25/2007  . Essential hypertension 03/25/2007  . GERD 03/25/2007  . HEPATITIS C, HX OF 03/25/2007   OCCUPATIONAL THERAPY DISCHARGE SUMMARY  Visits from Start of Care: 12  Current functional level related to goals / functional outcomes: See above goals   Remaining  deficits: Debility,  decreased mobility   Education / Equipment: HEP Plan: Patient agrees to discharge.  Patient goals were met. Patient is being discharged due to meeting the stated rehab goals.  ?????      Quay Burow, OTR/L 10/15/2014, 12:33 PM  [facility] [address] [place], [place], [address] Phone: [phone]   Fax:  [phone]

## 2014-10-16 DIAGNOSIS — D509 Iron deficiency anemia, unspecified: Secondary | ICD-10-CM | POA: Diagnosis not present

## 2014-10-16 DIAGNOSIS — N2581 Secondary hyperparathyroidism of renal origin: Secondary | ICD-10-CM | POA: Diagnosis not present

## 2014-10-16 DIAGNOSIS — N186 End stage renal disease: Secondary | ICD-10-CM | POA: Diagnosis not present

## 2014-10-16 DIAGNOSIS — E1122 Type 2 diabetes mellitus with diabetic chronic kidney disease: Secondary | ICD-10-CM | POA: Diagnosis not present

## 2014-10-17 ENCOUNTER — Encounter: Payer: Self-pay | Admitting: Occupational Therapy

## 2014-10-17 ENCOUNTER — Encounter: Payer: Self-pay | Admitting: Physical Therapy

## 2014-10-17 ENCOUNTER — Ambulatory Visit: Payer: Medicare Other | Admitting: Physical Therapy

## 2014-10-17 DIAGNOSIS — Z89512 Acquired absence of left leg below knee: Secondary | ICD-10-CM

## 2014-10-17 DIAGNOSIS — Z7409 Other reduced mobility: Secondary | ICD-10-CM

## 2014-10-17 DIAGNOSIS — R531 Weakness: Secondary | ICD-10-CM

## 2014-10-17 DIAGNOSIS — R4189 Other symptoms and signs involving cognitive functions and awareness: Secondary | ICD-10-CM | POA: Diagnosis not present

## 2014-10-17 DIAGNOSIS — R2689 Other abnormalities of gait and mobility: Secondary | ICD-10-CM

## 2014-10-17 DIAGNOSIS — Z89511 Acquired absence of right leg below knee: Secondary | ICD-10-CM

## 2014-10-17 DIAGNOSIS — R5381 Other malaise: Secondary | ICD-10-CM | POA: Diagnosis not present

## 2014-10-17 NOTE — Therapy (Signed)
[facility] [address] [place], [place], [address] Phone: [phone]   Fax:  [phone]  Physical Therapy Treatment  Patient Details  Name: Oscar Castillo MRN: [id number] Date of Birth: [birth year] Referring Provider:  [name], MD  Encounter Date: 10/17/2014      PT End of Session - 10/17/14 1206    Visit Number 12   Number of Visits 34   Date for PT Re-Evaluation 11/02/14   PT Start Time 1100   PT Stop Time 1153   PT Time Calculation (min) 53 min   Equipment Utilized During Treatment Gait belt   Activity Tolerance Patient tolerated treatment well   Behavior During Therapy [facility] for tasks assessed/performed      Past Medical History  Diagnosis Date  . ESRD on hemodialysis 05/05/2007    ESRD due to DM/HTN. Started dialysis in November 2013.  HD TTS at [facility] on [place].  [name] BACK PAIN, LUMBAR, CHRONIC 08/06/2009  . BENIGN PROSTATIC HYPERTROPHY 08/01/2010  . CEREBROVASCULAR ACCIDENT, HX OF 08/06/2009  . CHOLELITHIASIS 08/01/2010  . CONGESTIVE HEART FAILURE 03/18/2009  . DEPRESSION 03/18/2009  . DIABETES MELLITUS, TYPE II 03/25/2007  . ERECTILE DYSFUNCTION 03/25/2007  . GERD 03/25/2007  . HEPATITIS C, HX OF 03/25/2007  . HYPERTENSION 03/25/2007  . Morbid obesity 03/25/2007  . NEPHROLITHIASIS, HX OF 03/18/2009  . Complication of anesthesia     wife states pt had trouble waking up with his last surgery in Nov., 2014    Past Surgical History  Procedure Laterality Date  . Nephrectomy      partial RR  . Av fistula placement  06/14/2012    Procedure: ARTERIOVENOUS (AV) FISTULA CREATION;  Surgeon: Angelia Mould, MD;  Location: [facility] OR;  Service: Vascular;  Laterality: Left;  Left basilic vein transposition with fistula.  . Tibia im nail insertion Left 09/09/2012    Procedure: INTRAMEDULLARY (IM) NAIL TIBIAL;  Surgeon: Johnny Bridge, MD;  Location: [facility];  Service: Orthopedics;  Laterality: Left;  left  tibial nail and open reduction internal fixation left fibula fracture  . Orif fibula fracture Left 09/09/2012    Procedure: OPEN REDUCTION INTERNAL FIXATION (ORIF) FIBULA FRACTURE;  Surgeon: Johnny Bridge, MD;  Location: [facility];  Service: Orthopedics;  Laterality: Left;  . Colonoscopy N/A 10/28/2012    Procedure: COLONOSCOPY;  Surgeon: Jeryl Columbia, MD;  Location: [facility] ENDOSCOPY;  Service: Endoscopy;  Laterality: N/A;  . Esophagogastroduodenoscopy N/A 11/02/2012    Procedure: ESOPHAGOGASTRODUODENOSCOPY (EGD);  Surgeon: Cleotis Nipper, MD;  Location: [facility] ENDOSCOPY;  Service: Endoscopy;  Laterality: N/A;  . Colonoscopy N/A 11/02/2012    Procedure: COLONOSCOPY;  Surgeon: Cleotis Nipper, MD;  Location: [facility] ENDOSCOPY;  Service: Endoscopy;  Laterality: N/A;  . Colonoscopy N/A 11/03/2012    Procedure: COLONOSCOPY;  Surgeon: Cleotis Nipper, MD;  Location: [facility] ENDOSCOPY;  Service: Endoscopy;  Laterality: N/A;  . Givens capsule study N/A 11/04/2012    Procedure: GIVENS CAPSULE STUDY;  Surgeon: Cleotis Nipper, MD;  Location: [facility] ENDOSCOPY;  Service: Endoscopy;  Laterality: N/A;  . Enteroscopy N/A 11/08/2012    Procedure: ENTEROSCOPY;  Surgeon: Wonda Horner, MD;  Location: [facility] ENDOSCOPY;  Service: Endoscopy;  Laterality: N/A;  . Amputation Left 05/12/2013    Procedure: AMPUTATION RAY;  Surgeon: Newt Minion, MD;  Location: [place];  Service: Orthopedics;  Laterality: Left;  Left Foot 1st Ray Amputation  . Eye surgery Left  to remove scar tissue  . Amputation Left 06/09/2013    Procedure: AMPUTATION BELOW KNEE;  Surgeon: Newt Minion, MD;  Location: [place];  Service: Orthopedics;  Laterality: Left;  Left Below Knee Amputation and removal proximal screws IM tibial nail  . Hardware removal Left 06/09/2013    Procedure: HARDWARE REMOVAL;  Surgeon: Newt Minion, MD;  Location: [place];  Service: Orthopedics;  Laterality: Left;  Left Below Knee Amputation  and Removal proximal screws IM tibial nail  . Amputation  Right 09/08/2013    Procedure: AMPUTATION BELOW KNEE;  Surgeon: Newt Minion, MD;  Location: [place];  Service: Orthopedics;  Laterality: Right;  Right Below Knee Amputation  . Amputation Right 10/11/2013    Procedure: AMPUTATION BELOW KNEE;  Surgeon: Newt Minion, MD;  Location: [place];  Service: Orthopedics;  Laterality: Right;  Right Below Knee Amputation Revision    There were no vitals filed for this visit.  Visit Diagnosis:  Generalized weakness  Impaired functional mobility and activity tolerance  Balance problems  Status post bilateral below knee amputation      Subjective Assessment - 10/17/14 1104    Symptoms wore prostheses ~5 hrs.   Currently in Pain? Yes   Pain Score 7    Pain Location Arm                       [facility] Adult PT Treatment/Exercise - 10/17/14 1146    Transfers   Sit to Stand 1: +2 Total assist;From elevated surface;With upper extremity assist;With armrests;From chair/3-in-1;3: Mod assist  from 22" w/c to RW +2 Total assist, From 24" bar with parall   Sit to Stand: Patient Percentage 30%   Stand to Sit 3: Mod assist;With upper extremity assist;With armrests;To chair/3-in-1  RW to 22" w/c    Squat Pivot Transfers With upper extremity assistance;4: Min guard  armrest removed toward direction transfering   Ambulation/Gait   Ambulation/Gait No   Static Standing Balance   Static Standing - Balance Support Bilateral upper extremity supported  RW   Static Standing - Level of Assistance 3: Mod assist   Static Standing - Comment/# of Minutes 25 sec   4 reps   Dynamic Standing Balance   Dynamic Standing - Balance Support No upper extremity supported  sitting on 24" bar stool in parallel bars   Dynamic Standing - Level of Assistance 4: Min assist   Dynamic Standing - Balance Activities Reaching for objects  red theraband reciprocal & BUE row, overhead reach & forward   Dynamic Standing - Comments trunk leans with recovery - forward, back &  rotation without UE, side bends with UE assist.   Knee/Hip Exercises: Stretches   Passive Hamstring Stretch 3 reps;20 seconds  seated on 24" stool using Prostretch with PT performing pres   Knee/Hip Exercises: Aerobic   Stationary Bike NuStep Level 4 with BUEs & BLEs 4 minutes  cues on ROM   Knee/Hip Exercises: Seated   Other Seated Knee Exercises feet against wall - reciprocal leg press 5 sec hold 10 reps  feet against step on stairs - leg press reaching other leg   Other Seated Knee Exercises manual resistance seated add & abd 5 sec hold 10 reps   Prosthetics   Current prosthetic wear tolerance (days/week)  7 days   Current prosthetic wear tolerance (#hours/day)  >80% of out of bed hours   Education Provided Other (comment)  proper donning with no air in liners  Person(s) Educated Patient;Spouse   Education Method Explanation;Demonstration   Education Method Verbalized understanding   Donning Prosthesis Minimal assist   Doffing Prosthesis Minimal assist                PT Education - 10/17/14 1203    Education provided Yes   Education Details See prosthetic care, have w/c assessed to determine cost of repairs   Person(s) Educated Patient;Spouse   Methods Explanation   Comprehension Verbalized understanding          PT Short Term Goals - 10/01/14 1415    PT SHORT TERM GOAL #1   Title donnes prostheses with minimal assist (Target Date: 10/02/14)   Time 1   Period Months   Status Partially Met   PT SHORT TERM GOAL #2   Title wife & patient verbalize adjusting wear schedule to maximize functional potential. (Target Date: 10/02/14)   Time 1   Period Months   Status Achieved   PT SHORT TERM GOAL #3   Title performs squat-pivot transfer w/c to level mat with minimal assist. (Target Date: 10/02/14)   Baseline MET 09/26/14   Time 1   Period Months   Status Achieved   PT SHORT TERM GOAL #4   Title Sit to/from stand w/c to parallel bars with moderate assist (Target Date:  10/02/14)   Baseline MET 09/26/14   Time 1   Period Months   Status Achieved   PT SHORT TERM GOAL #5   Title patient & wife demonstrate initial HEP correctly. (Target Date: 10/02/14)   Baseline MET 09/21/14   Time 1   Period Months   Status Achieved   Additional Short Term Goals   Additional Short Term Goals Yes   PT SHORT TERM GOAL #6   Title reports wear of prostheses daily >80% of awake hours. (Target Date: 11/02/14)   Time 30   Period Days   Status New   PT SHORT TERM GOAL #7   Title Squat-pivot transfer with supervision. (Target Date: 11/02/14)   Time 30   Period Days   Status New   PT SHORT TERM GOAL #8   Title Sit to / from stand w/c to RW with moderate assist. (Target Date: 11/02/14)   Time 30   Period Days   Status New           PT Long Term Goals - 09/03/14 1015    PT LONG TERM GOAL #1   Title Patient and wife demonstrate proper prosthetic care. (Target Date: 01/02/15)   Time 4   Period Months   Status New   PT LONG TERM GOAL #2   Title tolerates wear of prostheses >90% of awake hours except at dialysis (Target Date: 01/02/15)   Time 4   Period Months   Status New   PT LONG TERM GOAL #3   Title stand pivot transfers with rolling walker & prostheses modified independent.  (Target Date: 01/02/15)   Time 4   Period Months   Status New   PT LONG TERM GOAL #4   Title manages clothes, reaches 5" and in upper /lower cabinets in standing with UE support modified independent.  (Target Date: 01/02/15)   Time 4   Period Months   Status New   PT LONG TERM GOAL #5   Title ambulate 63' with rolling walker & prostheses with minimal assist.  (Target Date: 01/02/15)   Time 4   Period Months   Status New  Plan - 10/17/14 1206    Clinical Impression Statement Patient increased standing time to 25 sec today and required less assistance. Patient's strength is improving slowly which is carrying over into standing and transfers. Patient & wife want to continue in 2  weeks when current certification period is over and he is making progress so PT plans to renew.   Pt will benefit from skilled therapeutic intervention in order to improve on the following deficits Abnormal gait;Decreased activity tolerance;Decreased balance;Decreased endurance;Decreased knowledge of use of DME;Decreased mobility;Decreased range of motion;Decreased strength;Difficulty walking;Other (comment)  prosthetic dependency   Rehab Potential Good   Clinical Impairments Affecting Rehab Potential multiple medical issues over last year causing decondtioning   PT Frequency 2x / week   PT Duration Other (comment)  4 months   PT Treatment/Interventions ADLs/Self Care Home Management;Gait training;Stair training;DME Instruction;Functional mobility training;Therapeutic activities;Therapeutic exercise;Balance training;Neuromuscular re-education;Patient/family education;Manual techniques;Other (comment)  prosthetic training   PT Next Visit Plan transfers, standing with rolling walker, strength & endurance activities   Consulted and Agree with Plan of Care Patient;Family member/caregiver   Family Member Consulted wife        Problem List Patient Active Problem List   Diagnosis Date Noted  . Diarrhea 08/14/2014  . Dehydration 10/02/2013  . Fever 10/02/2013  . Altered mental status 10/01/2013  . Encephalopathy, toxic 10/01/2013  . Encephalopathy, metabolic 37/04/6268  . CVA (cerebral vascular accident) 09/28/2013  . FTT (failure to thrive) in adult 09/28/2013  . Acute confusional state 09/28/2013  . Ulcer of sacral region, stage 3 09/26/2013  . S/P BKA (below knee amputation) bilateral 09/08/2013  . ESRD on hemodialysis 05/09/2013  . TIA (transient ischemic attack) 02/20/2013  . Acute blood loss anemia 10/28/2012  . Chronic combined systolic (EF 48%) and grade 2diastolic congestive heart failure 10/28/2012  . Obstipation 10/28/2012  . GI bleed 10/27/2012  . Mass in rectum 10/27/2012   . DM (diabetes mellitus) type I controlled with renal manifestation 09/08/2012  . LVH (left ventricular hypertrophy)-severe concentric 06/13/2012  . Anemia due to chronic illness 06/12/2012  . Hyperlipidemia 01/30/2011  . CHOLELITHIASIS 08/01/2010  . BENIGN PROSTATIC HYPERTROPHY 08/01/2010  . CEREBROVASCULAR ACCIDENT, HX OF 08/06/2009  . Depression 03/18/2009  . Acute combined systolic and diastolic heart failure, NYHA class 2-EF 45% 03/18/2009  . NEPHROLITHIASIS, HX OF 03/18/2009  . Morbid obesity 03/25/2007  . Essential hypertension 03/25/2007  . GERD 03/25/2007  . HEPATITIS C, HX OF 03/25/2007    Jamey Reas PT, DPT 10/17/2014, 12:10 PM  [facility] [address] [place], [place], [address] Phone: [phone]   Fax:  [phone]

## 2014-10-18 DIAGNOSIS — N2581 Secondary hyperparathyroidism of renal origin: Secondary | ICD-10-CM | POA: Diagnosis not present

## 2014-10-18 DIAGNOSIS — D509 Iron deficiency anemia, unspecified: Secondary | ICD-10-CM | POA: Diagnosis not present

## 2014-10-18 DIAGNOSIS — E1122 Type 2 diabetes mellitus with diabetic chronic kidney disease: Secondary | ICD-10-CM | POA: Diagnosis not present

## 2014-10-18 DIAGNOSIS — N186 End stage renal disease: Secondary | ICD-10-CM | POA: Diagnosis not present

## 2014-10-20 DIAGNOSIS — D509 Iron deficiency anemia, unspecified: Secondary | ICD-10-CM | POA: Diagnosis not present

## 2014-10-20 DIAGNOSIS — E1122 Type 2 diabetes mellitus with diabetic chronic kidney disease: Secondary | ICD-10-CM | POA: Diagnosis not present

## 2014-10-20 DIAGNOSIS — N2581 Secondary hyperparathyroidism of renal origin: Secondary | ICD-10-CM | POA: Diagnosis not present

## 2014-10-20 DIAGNOSIS — N186 End stage renal disease: Secondary | ICD-10-CM | POA: Diagnosis not present

## 2014-10-22 ENCOUNTER — Encounter: Payer: Self-pay | Admitting: Occupational Therapy

## 2014-10-22 ENCOUNTER — Encounter: Payer: Self-pay | Admitting: Physical Therapy

## 2014-10-22 ENCOUNTER — Ambulatory Visit: Payer: Medicare Other | Admitting: Physical Therapy

## 2014-10-22 DIAGNOSIS — R531 Weakness: Secondary | ICD-10-CM

## 2014-10-22 DIAGNOSIS — Z89511 Acquired absence of right leg below knee: Secondary | ICD-10-CM | POA: Diagnosis not present

## 2014-10-22 DIAGNOSIS — R2689 Other abnormalities of gait and mobility: Secondary | ICD-10-CM

## 2014-10-22 DIAGNOSIS — R4189 Other symptoms and signs involving cognitive functions and awareness: Secondary | ICD-10-CM | POA: Diagnosis not present

## 2014-10-22 DIAGNOSIS — Z7409 Other reduced mobility: Secondary | ICD-10-CM

## 2014-10-22 DIAGNOSIS — R269 Unspecified abnormalities of gait and mobility: Secondary | ICD-10-CM

## 2014-10-22 DIAGNOSIS — Z89512 Acquired absence of left leg below knee: Secondary | ICD-10-CM

## 2014-10-22 DIAGNOSIS — R5381 Other malaise: Secondary | ICD-10-CM | POA: Diagnosis not present

## 2014-10-22 NOTE — Therapy (Signed)
[facility] [address] [place], [place], [address] Phone: [phone]   Fax:  [phone]  Physical Therapy Treatment  Patient Details  Name: Oscar Castillo MRN: [id number] Date of Birth: 1950-09-14 Referring Provider:  [name], MD  Encounter Date: 10/22/2014      PT End of Session - 10/22/14 1015    Visit Number 14   Number of Visits 34   Date for PT Re-Evaluation 11/02/14   PT Start Time 4081   PT Stop Time 1100   PT Time Calculation (min) 45 min   Equipment Utilized During Treatment Gait belt   Activity Tolerance Patient limited by fatigue   Behavior During Therapy [facility] for tasks assessed/performed      Past Medical History  Diagnosis Date  . ESRD on hemodialysis 05/05/2007    ESRD due to DM/HTN. Started dialysis in November 2013.  HD TTS at [facility] on [address].  [name] BACK PAIN, LUMBAR, CHRONIC 08/06/2009  . BENIGN PROSTATIC HYPERTROPHY 08/01/2010  . CEREBROVASCULAR ACCIDENT, HX OF 08/06/2009  . CHOLELITHIASIS 08/01/2010  . CONGESTIVE HEART FAILURE 03/18/2009  . DEPRESSION 03/18/2009  . DIABETES MELLITUS, TYPE II 03/25/2007  . ERECTILE DYSFUNCTION 03/25/2007  . GERD 03/25/2007  . HEPATITIS C, HX OF 03/25/2007  . HYPERTENSION 03/25/2007  . Morbid obesity 03/25/2007  . NEPHROLITHIASIS, HX OF 03/18/2009  . Complication of anesthesia     wife states pt had trouble waking up with his last surgery in Nov., 2014    Past Surgical History  Procedure Laterality Date  . Nephrectomy      partial RR  . Av fistula placement  06/14/2012    Procedure: ARTERIOVENOUS (AV) FISTULA CREATION;  Surgeon: Angelia Mould, MD;  Location: [facility] OR;  Service: Vascular;  Laterality: Left;  Left basilic vein transposition with fistula.  . Tibia im nail insertion Left 09/09/2012    Procedure: INTRAMEDULLARY (IM) NAIL TIBIAL;  Surgeon: Johnny Bridge, MD;  Location: [facility] [place];  Service: Orthopedics;  Laterality: Left;  left tibial nail  and open reduction internal fixation left fibula fracture  . Orif fibula fracture Left 09/09/2012    Procedure: OPEN REDUCTION INTERNAL FIXATION (ORIF) FIBULA FRACTURE;  Surgeon: Johnny Bridge, MD;  Location: [place];  Service: Orthopedics;  Laterality: Left;  . Colonoscopy N/A 10/28/2012    Procedure: COLONOSCOPY;  Surgeon: Jeryl Columbia, MD;  Location: [facility] ENDOSCOPY;  Service: Endoscopy;  Laterality: N/A;  . Esophagogastroduodenoscopy N/A 11/02/2012    Procedure: ESOPHAGOGASTRODUODENOSCOPY (EGD);  Surgeon: Cleotis Nipper, MD;  Location: [facility] ENDOSCOPY;  Service: Endoscopy;  Laterality: N/A;  . Colonoscopy N/A 11/02/2012    Procedure: COLONOSCOPY;  Surgeon: Cleotis Nipper, MD;  Location: [facility] ENDOSCOPY;  Service: Endoscopy;  Laterality: N/A;  . Colonoscopy N/A 11/03/2012    Procedure: COLONOSCOPY;  Surgeon: Cleotis Nipper, MD;  Location: [facility] ENDOSCOPY;  Service: Endoscopy;  Laterality: N/A;  . Givens capsule study N/A 11/04/2012    Procedure: GIVENS CAPSULE STUDY;  Surgeon: Cleotis Nipper, MD;  Location: [facility] ENDOSCOPY;  Service: Endoscopy;  Laterality: N/A;  . Enteroscopy N/A 11/08/2012    Procedure: ENTEROSCOPY;  Surgeon: Wonda Horner, MD;  Location: [facility] ENDOSCOPY;  Service: Endoscopy;  Laterality: N/A;  . Amputation Left 05/12/2013    Procedure: AMPUTATION RAY;  Surgeon: Newt Minion, MD;  Location: [facility];  Service: Orthopedics;  Laterality: Left;  Left Foot 1st Ray Amputation  . Eye surgery Left  to remove scar tissue  . Amputation Left 06/09/2013    Procedure: AMPUTATION BELOW KNEE;  Surgeon: Newt Minion, MD;  Location: [facility];  Service: Orthopedics;  Laterality: Left;  Left Below Knee Amputation and removal proximal screws IM tibial nail  . Hardware removal Left 06/09/2013    Procedure: HARDWARE REMOVAL;  Surgeon: Newt Minion, MD;  Location: [place];  Service: Orthopedics;  Laterality: Left;  Left Below Knee Amputation  and Removal proximal screws IM tibial nail  . Amputation Right 09/08/2013     Procedure: AMPUTATION BELOW KNEE;  Surgeon: Newt Minion, MD;  Location: [place];  Service: Orthopedics;  Laterality: Right;  Right Below Knee Amputation  . Amputation Right 10/11/2013    Procedure: AMPUTATION BELOW KNEE;  Surgeon: Newt Minion, MD;  Location: [place];  Service: Orthopedics;  Laterality: Right;  Right Below Knee Amputation Revision    There were no vitals filed for this visit.  Visit Diagnosis:  Generalized weakness  Impaired functional mobility and activity tolerance  Balance problems  Status post bilateral below knee amputation  Abnormality of gait      Subjective Assessment - 10/22/14 1015    Symptoms Patient wearing prostheses to dialysis without issues.   Currently in Pain? Yes   Pain Score 6    Pain Location Arm   Pain Orientation Right;Left                       [facility] Adult PT Treatment/Exercise - 10/22/14 1733    Transfers   Sit to Stand 1: +2 Total assist;From elevated surface;With upper extremity assist;With armrests;From chair/3-in-1;3: Mod assist  from 22" w/c to RW +2 Total assist, From 24" bar with parall   Sit to Stand: Patient Percentage 30%   Stand to Sit 3: Mod assist;With upper extremity assist;With armrests;To chair/3-in-1  RW to 22" w/c    Squat Pivot Transfers With upper extremity assistance;4: Min guard  armrest removed toward direction transfering   Ambulation/Gait   Ambulation/Gait Yes   Ambulation/Gait Assistance 1: +2 Total assist   Ambulation/Gait Assistance Details 1 therapist on each side assisting upright & knee control, 3rd person keeping w/c directly behind pt as faitgues quickly   Ambulation Distance (Feet) 3 Feet  3' x 1 & 4" X 1   Assistive device Rolling walker;Prostheses   Gait Pattern Right flexed knee in stance;Left flexed knee in stance;Trunk flexed;Poor foot clearance - left;Poor foot clearance - right;Decreased hip/knee flexion - right;Decreased hip/knee flexion - left;Decreased stride  length;Step-to pattern   Ambulation Surface Level;Indoor   Static Standing Balance   Static Standing - Balance Support Bilateral upper extremity supported  RW   Static Standing - Level of Assistance 3: Mod assist  2nd person for safety   Static Standing - Comment/# of Minutes 30 sec  4 reps   Dynamic Standing Balance   Dynamic Standing - Balance Support No upper extremity supported  sitting on 24" bar stool in parallel bars   Dynamic Standing - Level of Assistance 4: Min assist   Dynamic Standing - Balance Activities Reaching for objects  red theraband reciprocal & BUE row, overhead reach & forward   Dynamic Standing - Comments AROM trunk forward lean / recovery, backward lean / recovery, rotation; With UE assist lateral trunk lean.    Knee/Hip Exercises: Stretches   Passive Hamstring Stretch 3 reps;20 seconds  seated on 24" stool using Prostretch with PT performing pres   Knee/Hip Exercises: Aerobic  Stationary Bike --   Knee/Hip Exercises: Seated   Other Seated Knee Exercises feet against step on stairs - leg press reaching other leg   Other Seated Knee Exercises manual resistance seated add & abd 5 sec hold 10 reps   Prosthetics   Current prosthetic wear tolerance (days/week)  7 days   Current prosthetic wear tolerance (#hours/day)  >80% of out of bed hours                  PT Short Term Goals - 10/01/14 1415    PT SHORT TERM GOAL #1   Title donnes prostheses with minimal assist (Target Date: 10/02/14)   Time 1   Period Months   Status Partially Met   PT SHORT TERM GOAL #2   Title wife & patient verbalize adjusting wear schedule to maximize functional potential. (Target Date: 10/02/14)   Time 1   Period Months   Status Achieved   PT SHORT TERM GOAL #3   Title performs squat-pivot transfer w/c to level mat with minimal assist. (Target Date: 10/02/14)   Baseline MET 09/26/14   Time 1   Period Months   Status Achieved   PT SHORT TERM GOAL #4   Title Sit to/from  stand w/c to parallel bars with moderate assist (Target Date: 10/02/14)   Baseline MET 09/26/14   Time 1   Period Months   Status Achieved   PT SHORT TERM GOAL #5   Title patient & wife demonstrate initial HEP correctly. (Target Date: 10/02/14)   Baseline MET 09/21/14   Time 1   Period Months   Status Achieved   Additional Short Term Goals   Additional Short Term Goals Yes   PT SHORT TERM GOAL #6   Title reports wear of prostheses daily >80% of awake hours. (Target Date: 11/02/14)   Time 30   Period Days   Status New   PT SHORT TERM GOAL #7   Title Squat-pivot transfer with supervision. (Target Date: 11/02/14)   Time 30   Period Days   Status New   PT SHORT TERM GOAL #8   Title Sit to / from stand w/c to RW with moderate assist. (Target Date: 11/02/14)   Time 30   Period Days   Status New           PT Long Term Goals - 09/03/14 1015    PT LONG TERM GOAL #1   Title Patient and wife demonstrate proper prosthetic care. (Target Date: 01/02/15)   Time 4   Period Months   Status New   PT LONG TERM GOAL #2   Title tolerates wear of prostheses >90% of awake hours except at dialysis (Target Date: 01/02/15)   Time 4   Period Months   Status New   PT LONG TERM GOAL #3   Title stand pivot transfers with rolling walker & prostheses modified independent.  (Target Date: 01/02/15)   Time 4   Period Months   Status New   PT LONG TERM GOAL #4   Title manages clothes, reaches 5" and in upper /lower cabinets in standing with UE support modified independent.  (Target Date: 01/02/15)   Time 4   Period Months   Status New   PT LONG TERM GOAL #5   Title ambulate 34' with rolling walker & prostheses with minimal assist.  (Target Date: 01/02/15)   Time 4   Period Months   Status New  Plan - 10/22/14 1015    Clinical Impression Statement Patient worked thru fatigue during whole session. Patient improved standing tolerance to 30 sec. Patient increased trunk stability when sitting on  24" stool.   Pt will benefit from skilled therapeutic intervention in order to improve on the following deficits Abnormal gait;Decreased activity tolerance;Decreased balance;Decreased endurance;Decreased knowledge of use of DME;Decreased mobility;Decreased range of motion;Decreased strength;Difficulty walking;Other (comment)  prosthetic dependency   Rehab Potential Good   Clinical Impairments Affecting Rehab Potential multiple medical issues over last year causing decondtioning   PT Frequency 2x / week   PT Duration Other (comment)  4 months   PT Treatment/Interventions ADLs/Self Care Home Management;Gait training;Stair training;DME Instruction;Functional mobility training;Therapeutic activities;Therapeutic exercise;Balance training;Neuromuscular re-education;Patient/family education;Manual techniques;Other (comment)  prosthetic training   PT Next Visit Plan transfers, standing with rolling walker, strength & endurance activities   Consulted and Agree with Plan of Care Patient;Family member/caregiver   Family Member Consulted wife        Problem List Patient Active Problem List   Diagnosis Date Noted  . Diarrhea 08/14/2014  . Dehydration 10/02/2013  . Fever 10/02/2013  . Altered mental status 10/01/2013  . Encephalopathy, toxic 10/01/2013  . Encephalopathy, metabolic 14/43/1540  . CVA (cerebral vascular accident) 09/28/2013  . FTT (failure to thrive) in adult 09/28/2013  . Acute confusional state 09/28/2013  . Ulcer of sacral region, stage 3 09/26/2013  . S/P BKA (below knee amputation) bilateral 09/08/2013  . ESRD on hemodialysis 05/09/2013  . TIA (transient ischemic attack) 02/20/2013  . Acute blood loss anemia 10/28/2012  . Chronic combined systolic (EF 08%) and grade 2diastolic congestive heart failure 10/28/2012  . Obstipation 10/28/2012  . GI bleed 10/27/2012  . Mass in rectum 10/27/2012  . DM (diabetes mellitus) type I controlled with renal manifestation 09/08/2012  .  LVH (left ventricular hypertrophy)-severe concentric 06/13/2012  . Anemia due to chronic illness 06/12/2012  . Hyperlipidemia 01/30/2011  . CHOLELITHIASIS 08/01/2010  . BENIGN PROSTATIC HYPERTROPHY 08/01/2010  . CEREBROVASCULAR ACCIDENT, HX OF 08/06/2009  . Depression 03/18/2009  . Acute combined systolic and diastolic heart failure, NYHA class 2-EF 45% 03/18/2009  . NEPHROLITHIASIS, HX OF 03/18/2009  . Morbid obesity 03/25/2007  . Essential hypertension 03/25/2007  . GERD 03/25/2007  . HEPATITIS C, HX OF 03/25/2007    Jamey Reas PT, DPT 10/22/2014, 5:52 PM  [facility] [address] [place], [place], [address] Phone: [phone]   Fax:  [phone]

## 2014-10-23 DIAGNOSIS — N186 End stage renal disease: Secondary | ICD-10-CM | POA: Diagnosis not present

## 2014-10-23 DIAGNOSIS — E1122 Type 2 diabetes mellitus with diabetic chronic kidney disease: Secondary | ICD-10-CM | POA: Diagnosis not present

## 2014-10-23 DIAGNOSIS — D509 Iron deficiency anemia, unspecified: Secondary | ICD-10-CM | POA: Diagnosis not present

## 2014-10-23 DIAGNOSIS — N2581 Secondary hyperparathyroidism of renal origin: Secondary | ICD-10-CM | POA: Diagnosis not present

## 2014-10-24 ENCOUNTER — Ambulatory Visit: Payer: Medicare Other | Admitting: Physical Therapy

## 2014-10-24 ENCOUNTER — Encounter: Payer: Self-pay | Admitting: Physical Therapy

## 2014-10-24 ENCOUNTER — Encounter: Payer: Self-pay | Admitting: Occupational Therapy

## 2014-10-24 DIAGNOSIS — M24669 Ankylosis, unspecified knee: Secondary | ICD-10-CM

## 2014-10-24 DIAGNOSIS — Z7409 Other reduced mobility: Secondary | ICD-10-CM | POA: Diagnosis not present

## 2014-10-24 DIAGNOSIS — R2689 Other abnormalities of gait and mobility: Secondary | ICD-10-CM

## 2014-10-24 DIAGNOSIS — R531 Weakness: Secondary | ICD-10-CM

## 2014-10-24 DIAGNOSIS — R4189 Other symptoms and signs involving cognitive functions and awareness: Secondary | ICD-10-CM | POA: Diagnosis not present

## 2014-10-24 DIAGNOSIS — Z89512 Acquired absence of left leg below knee: Secondary | ICD-10-CM | POA: Diagnosis not present

## 2014-10-24 DIAGNOSIS — Z89511 Acquired absence of right leg below knee: Secondary | ICD-10-CM

## 2014-10-24 DIAGNOSIS — R5381 Other malaise: Secondary | ICD-10-CM | POA: Diagnosis not present

## 2014-10-24 NOTE — Therapy (Signed)
[facility] [address] [place], [place], [address] Phone: [phone]   Fax:  [phone]  Physical Therapy Treatment  Patient Details  Name: Oscar Castillo MRN: [id number] Date of Birth: [birth year] Referring Provider:  [name], MD  Encounter Date: 10/24/2014      PT End of Session - 10/24/14 1400    Visit Number 15   Number of Visits 34   Date for PT Re-Evaluation 11/02/14   PT Start Time 1400   PT Stop Time 1445   PT Time Calculation (min) 45 min   Equipment Utilized During Treatment Gait belt   Activity Tolerance Patient limited by fatigue   Behavior During Therapy [facility] for tasks assessed/performed      Past Medical History  Diagnosis Date  . ESRD on hemodialysis 05/05/2007    ESRD due to DM/HTN. Started dialysis in November 2013.  HD TTS at [facility] on [address].  [name] BACK PAIN, LUMBAR, CHRONIC 08/06/2009  . BENIGN PROSTATIC HYPERTROPHY 08/01/2010  . CEREBROVASCULAR ACCIDENT, HX OF 08/06/2009  . CHOLELITHIASIS 08/01/2010  . CONGESTIVE HEART FAILURE 03/18/2009  . DEPRESSION 03/18/2009  . DIABETES MELLITUS, TYPE II 03/25/2007  . ERECTILE DYSFUNCTION 03/25/2007  . GERD 03/25/2007  . HEPATITIS C, HX OF 03/25/2007  . HYPERTENSION 03/25/2007  . Morbid obesity 03/25/2007  . NEPHROLITHIASIS, HX OF 03/18/2009  . Complication of anesthesia     wife states pt had trouble waking up with his last surgery in Nov., 2014    Past Surgical History  Procedure Laterality Date  . Nephrectomy      partial RR  . Av fistula placement  06/14/2012    Procedure: ARTERIOVENOUS (AV) FISTULA CREATION;  Surgeon: Angelia Mould, MD;  Location: [facility] OR;  Service: Vascular;  Laterality: Left;  Left basilic vein transposition with fistula.  . Tibia im nail insertion Left 09/09/2012    Procedure: INTRAMEDULLARY (IM) NAIL TIBIAL;  Surgeon: Johnny Bridge, MD;  Location: [facility];  Service: Orthopedics;  Laterality: Left;  left tibial nail  and open reduction internal fixation left fibula fracture  . Orif fibula fracture Left 09/09/2012    Procedure: OPEN REDUCTION INTERNAL FIXATION (ORIF) FIBULA FRACTURE;  Surgeon: Johnny Bridge, MD;  Location: [place];  Service: Orthopedics;  Laterality: Left;  . Colonoscopy N/A 10/28/2012    Procedure: COLONOSCOPY;  Surgeon: Jeryl Columbia, MD;  Location: [facility] ENDOSCOPY;  Service: Endoscopy;  Laterality: N/A;  . Esophagogastroduodenoscopy N/A 11/02/2012    Procedure: ESOPHAGOGASTRODUODENOSCOPY (EGD);  Surgeon: Cleotis Nipper, MD;  Location: [facility] ENDOSCOPY;  Service: Endoscopy;  Laterality: N/A;  . Colonoscopy N/A 11/02/2012    Procedure: COLONOSCOPY;  Surgeon: Cleotis Nipper, MD;  Location: [facility] ENDOSCOPY;  Service: Endoscopy;  Laterality: N/A;  . Colonoscopy N/A 11/03/2012    Procedure: COLONOSCOPY;  Surgeon: Cleotis Nipper, MD;  Location: [facility] ENDOSCOPY;  Service: Endoscopy;  Laterality: N/A;  . Givens capsule study N/A 11/04/2012    Procedure: GIVENS CAPSULE STUDY;  Surgeon: Cleotis Nipper, MD;  Location: [facility] ENDOSCOPY;  Service: Endoscopy;  Laterality: N/A;  . Enteroscopy N/A 11/08/2012    Procedure: ENTEROSCOPY;  Surgeon: Wonda Horner, MD;  Location: [facility] ENDOSCOPY;  Service: Endoscopy;  Laterality: N/A;  . Amputation Left 05/12/2013    Procedure: AMPUTATION RAY;  Surgeon: Newt Minion, MD;  Location: [place];  Service: Orthopedics;  Laterality: Left;  Left Foot 1st Ray Amputation  . Eye surgery Left  to remove scar tissue  . Amputation Left 06/09/2013    Procedure: AMPUTATION BELOW KNEE;  Surgeon: Newt Minion, MD;  Location: [place];  Service: Orthopedics;  Laterality: Left;  Left Below Knee Amputation and removal proximal screws IM tibial nail  . Hardware removal Left 06/09/2013    Procedure: HARDWARE REMOVAL;  Surgeon: Newt Minion, MD;  Location: [place];  Service: Orthopedics;  Laterality: Left;  Left Below Knee Amputation  and Removal proximal screws IM tibial nail  . Amputation Right 09/08/2013     Procedure: AMPUTATION BELOW KNEE;  Surgeon: Newt Minion, MD;  Location: [place];  Service: Orthopedics;  Laterality: Right;  Right Below Knee Amputation  . Amputation Right 10/11/2013    Procedure: AMPUTATION BELOW KNEE;  Surgeon: Newt Minion, MD;  Location: [place];  Service: Orthopedics;  Laterality: Right;  Right Below Knee Amputation Revision    There were no vitals filed for this visit.  Visit Diagnosis:  Generalized weakness  Impaired functional mobility and activity tolerance  Balance problems  Status post bilateral below knee amputation  Decreased range of knee movement, unspecified laterality      Subjective Assessment - 10/24/14 1409    Symptoms (p) [place] [facility] [place] in handicap room.   Currently in Pain? (p) Yes   Pain Score (p) 6    Pain Location (p) Arm   Pain Orientation (p) Right;Left   Pain Descriptors / Indicators (p) Aching                       [facility] Adult PT Treatment/Exercise - 10/24/14 1400    Transfers   Sit to Stand From elevated surface;With upper extremity assist;With armrests;From chair/3-in-1;2: Max assist  to RW with knees blocked with belt   Sit to Stand: Patient Percentage 30%   Sit to Stand Details (indicate cue type and reason) verbal cues & manual cues on technique   Stand to Sit 3: Mod assist;With upper extremity assist;With armrests;To chair/3-in-1  RW to 22" w/c    Squat Pivot Transfers With upper extremity assistance;4: Min guard  armrest removed toward direction transfering to level surfe   Ambulation/Gait   Ambulation/Gait Yes   Ambulation/Gait Assistance 1: +2 Total assist   Ambulation/Gait Assistance Details 1 therapist on each side assisting upright & knee control / not buckling, wife assisting with w/c   Ambulation Distance (Feet) 4 Feet  4' X 1   Assistive device Rolling walker;Prostheses   Gait Pattern Right flexed knee in stance;Left flexed knee in stance;Trunk flexed;Poor foot clearance - left;Poor  foot clearance - right;Decreased hip/knee flexion - right;Decreased hip/knee flexion - left;Decreased stride length;Step-to pattern   Ambulation Surface Level;Indoor   Static Standing Balance   Static Standing - Balance Support Bilateral upper extremity supported  RW & PT used strap to support knees   Static Standing - Level of Assistance 3: Mod assist  1 person only   Static Standing - Comment/# of Minutes 30 sec 4 times   Dynamic Standing Balance   Dynamic Standing - Balance Support --   Dynamic Standing - Level of Assistance --   Dynamic Standing - Balance Activities --   Knee/Hip Exercises: Stretches   Passive Hamstring Stretch 3 reps;20 seconds  seated on 24" stool using Prostretch with PT performing pres   Knee/Hip Exercises: Seated   Other Seated Knee Exercises feet against step on stairs - leg press reaching other leg   Other Seated Knee Exercises  manual resistance seated add & abd 5 sec hold 10 reps   Prosthetics   Current prosthetic wear tolerance (days/week)  7 days   Current prosthetic wear tolerance (#hours/day)  >80% of out of bed hours     Standing frame: Patient tolerated standing in frame for 1 minute 3 times. Bilateral leg press 50# 10 reps, 40# 10 reps             PT Short Term Goals - 10/01/14 1415    PT SHORT TERM GOAL #1   Title donnes prostheses with minimal assist (Target Date: 10/02/14)   Time 1   Period Months   Status Partially Met   PT SHORT TERM GOAL #2   Title wife & patient verbalize adjusting wear schedule to maximize functional potential. (Target Date: 10/02/14)   Time 1   Period Months   Status Achieved   PT SHORT TERM GOAL #3   Title performs squat-pivot transfer w/c to level mat with minimal assist. (Target Date: 10/02/14)   Baseline MET 09/26/14   Time 1   Period Months   Status Achieved   PT SHORT TERM GOAL #4   Title Sit to/from stand w/c to parallel bars with moderate assist (Target Date: 10/02/14)   Baseline MET 09/26/14   Time 1    Period Months   Status Achieved   PT SHORT TERM GOAL #5   Title patient & wife demonstrate initial HEP correctly. (Target Date: 10/02/14)   Baseline MET 09/21/14   Time 1   Period Months   Status Achieved   Additional Short Term Goals   Additional Short Term Goals Yes   PT SHORT TERM GOAL #6   Title reports wear of prostheses daily >80% of awake hours. (Target Date: 11/02/14)   Time 30   Period Days   Status New   PT SHORT TERM GOAL #7   Title Squat-pivot transfer with supervision. (Target Date: 11/02/14)   Time 30   Period Days   Status New   PT SHORT TERM GOAL #8   Title Sit to / from stand w/c to RW with moderate assist. (Target Date: 11/02/14)   Time 30   Period Days   Status New           PT Long Term Goals - 09/03/14 1015    PT LONG TERM GOAL #1   Title Patient and wife demonstrate proper prosthetic care. (Target Date: 01/02/15)   Time 4   Period Months   Status New   PT LONG TERM GOAL #2   Title tolerates wear of prostheses >90% of awake hours except at dialysis (Target Date: 01/02/15)   Time 4   Period Months   Status New   PT LONG TERM GOAL #3   Title stand pivot transfers with rolling walker & prostheses modified independent.  (Target Date: 01/02/15)   Time 4   Period Months   Status New   PT LONG TERM GOAL #4   Title manages clothes, reaches 5" and in upper /lower cabinets in standing with UE support modified independent.  (Target Date: 01/02/15)   Time 4   Period Months   Status New   PT LONG TERM GOAL #5   Title ambulate 35' with rolling walker & prostheses with minimal assist.  (Target Date: 01/02/15)   Time 4   Period Months   Status New               Plan - 10/24/14 1400  Clinical Impression Statement Patient's range of knees was improved by standing frame. Patient is improving slowly with standing and transfers.   Pt will benefit from skilled therapeutic intervention in order to improve on the following deficits Abnormal gait;Decreased activity  tolerance;Decreased balance;Decreased endurance;Decreased knowledge of use of DME;Decreased mobility;Decreased range of motion;Decreased strength;Difficulty walking;Other (comment)  prosthetic dependency   Rehab Potential Good   Clinical Impairments Affecting Rehab Potential multiple medical issues over last year causing decondtioning   PT Frequency 2x / week   PT Duration Other (comment)  4 months   PT Treatment/Interventions ADLs/Self Care Home Management;Gait training;Stair training;DME Instruction;Functional mobility training;Therapeutic activities;Therapeutic exercise;Balance training;Neuromuscular re-education;Patient/family education;Manual techniques;Other (comment)  prosthetic training   PT Next Visit Plan standing frame, transfers, standing with rolling walker, strength & endurance activities   Consulted and Agree with Plan of Care Patient;Family member/caregiver   Family Member Consulted wife        Problem List Patient Active Problem List   Diagnosis Date Noted  . Diarrhea 08/14/2014  . Dehydration 10/02/2013  . Fever 10/02/2013  . Altered mental status 10/01/2013  . Encephalopathy, toxic 10/01/2013  . Encephalopathy, metabolic 37/90/2409  . CVA (cerebral vascular accident) 09/28/2013  . FTT (failure to thrive) in adult 09/28/2013  . Acute confusional state 09/28/2013  . Ulcer of sacral region, stage 3 09/26/2013  . S/P BKA (below knee amputation) bilateral 09/08/2013  . ESRD on hemodialysis 05/09/2013  . TIA (transient ischemic attack) 02/20/2013  . Acute blood loss anemia 10/28/2012  . Chronic combined systolic (EF 73%) and grade 2diastolic congestive heart failure 10/28/2012  . Obstipation 10/28/2012  . GI bleed 10/27/2012  . Mass in rectum 10/27/2012  . DM (diabetes mellitus) type I controlled with renal manifestation 09/08/2012  . LVH (left ventricular hypertrophy)-severe concentric 06/13/2012  . Anemia due to chronic illness 06/12/2012  . Hyperlipidemia  01/30/2011  . CHOLELITHIASIS 08/01/2010  . BENIGN PROSTATIC HYPERTROPHY 08/01/2010  . CEREBROVASCULAR ACCIDENT, HX OF 08/06/2009  . Depression 03/18/2009  . Acute combined systolic and diastolic heart failure, NYHA class 2-EF 45% 03/18/2009  . NEPHROLITHIASIS, HX OF 03/18/2009  . Morbid obesity 03/25/2007  . Essential hypertension 03/25/2007  . GERD 03/25/2007  . HEPATITIS C, HX OF 03/25/2007    Jamey Reas PT, DPT 10/24/2014, 9:25 PM  [facility] [address] [place], [place], [address] Phone: [phone]   Fax:  [phone]

## 2014-10-25 DIAGNOSIS — Z992 Dependence on renal dialysis: Secondary | ICD-10-CM | POA: Diagnosis not present

## 2014-10-25 DIAGNOSIS — N2581 Secondary hyperparathyroidism of renal origin: Secondary | ICD-10-CM | POA: Diagnosis not present

## 2014-10-25 DIAGNOSIS — E1122 Type 2 diabetes mellitus with diabetic chronic kidney disease: Secondary | ICD-10-CM | POA: Diagnosis not present

## 2014-10-25 DIAGNOSIS — N186 End stage renal disease: Secondary | ICD-10-CM | POA: Diagnosis not present

## 2014-10-25 DIAGNOSIS — D509 Iron deficiency anemia, unspecified: Secondary | ICD-10-CM | POA: Diagnosis not present

## 2014-10-27 DIAGNOSIS — N2581 Secondary hyperparathyroidism of renal origin: Secondary | ICD-10-CM | POA: Diagnosis not present

## 2014-10-27 DIAGNOSIS — E1122 Type 2 diabetes mellitus with diabetic chronic kidney disease: Secondary | ICD-10-CM | POA: Diagnosis not present

## 2014-10-27 DIAGNOSIS — N186 End stage renal disease: Secondary | ICD-10-CM | POA: Diagnosis not present

## 2014-10-29 ENCOUNTER — Encounter: Payer: Self-pay | Admitting: Occupational Therapy

## 2014-10-29 ENCOUNTER — Encounter: Payer: Self-pay | Admitting: Physical Therapy

## 2014-10-29 ENCOUNTER — Ambulatory Visit: Payer: Medicare Other | Attending: Internal Medicine | Admitting: Physical Therapy

## 2014-10-29 DIAGNOSIS — R4189 Other symptoms and signs involving cognitive functions and awareness: Secondary | ICD-10-CM | POA: Insufficient documentation

## 2014-10-29 DIAGNOSIS — R5381 Other malaise: Secondary | ICD-10-CM | POA: Diagnosis not present

## 2014-10-29 DIAGNOSIS — Z89511 Acquired absence of right leg below knee: Secondary | ICD-10-CM | POA: Insufficient documentation

## 2014-10-29 DIAGNOSIS — Z89512 Acquired absence of left leg below knee: Secondary | ICD-10-CM | POA: Insufficient documentation

## 2014-10-29 DIAGNOSIS — R531 Weakness: Secondary | ICD-10-CM | POA: Diagnosis not present

## 2014-10-29 DIAGNOSIS — R269 Unspecified abnormalities of gait and mobility: Secondary | ICD-10-CM

## 2014-10-29 DIAGNOSIS — Z7409 Other reduced mobility: Secondary | ICD-10-CM

## 2014-10-29 NOTE — Therapy (Signed)
[place] [address] [place], [place], [address] Phone: [phone]   Fax:  [phone]  Physical Therapy Treatment  Patient Details  Name: Oscar Castillo MRN: [id number] Date of Birth: [birth year] Referring Provider:  [name], MD  Encounter Date: 10/29/2014      PT End of Session - 10/29/14 1108    Visit Number 16  number adjusted to correct number due to miscount during previous sessions   Number of Visits 34   Date for PT Re-Evaluation 11/02/14   PT Start Time 1102   PT Stop Time 1145   PT Time Calculation (min) 43 min   Equipment Utilized During Treatment Gait belt   Activity Tolerance Patient limited by fatigue   Behavior During Therapy [facility] for tasks assessed/performed      Past Medical History  Diagnosis Date  . ESRD on hemodialysis 05/05/2007    ESRD due to DM/HTN. Started dialysis in November 2013.  HD TTS at [facility] [place].  [name] BACK PAIN, LUMBAR, CHRONIC 08/06/2009  . BENIGN PROSTATIC HYPERTROPHY 08/01/2010  . CEREBROVASCULAR ACCIDENT, HX OF 08/06/2009  . CHOLELITHIASIS 08/01/2010  . CONGESTIVE HEART FAILURE 03/18/2009  . DEPRESSION 03/18/2009  . DIABETES MELLITUS, TYPE II 03/25/2007  . ERECTILE DYSFUNCTION 03/25/2007  . GERD 03/25/2007  . HEPATITIS C, HX OF 03/25/2007  . HYPERTENSION 03/25/2007  . Morbid obesity 03/25/2007  . NEPHROLITHIASIS, HX OF 03/18/2009  . Complication of anesthesia     wife states pt had trouble waking up with his last surgery in Nov., 2014    Past Surgical History  Procedure Laterality Date  . Nephrectomy      partial RR  . Av fistula placement  06/14/2012    Procedure: ARTERIOVENOUS (AV) FISTULA CREATION;  Surgeon: Angelia Mould, MD;  Location: [facility] OR;  Service: Vascular;  Laterality: Left;  Left basilic vein transposition with fistula.  . Tibia im nail insertion Left 09/09/2012    Procedure: INTRAMEDULLARY (IM) NAIL TIBIAL;  Surgeon: Johnny Bridge, MD;   Location: [place];  Service: Orthopedics;  Laterality: Left;  left tibial nail and open reduction internal fixation left fibula fracture  . Orif fibula fracture Left 09/09/2012    Procedure: OPEN REDUCTION INTERNAL FIXATION (ORIF) FIBULA FRACTURE;  Surgeon: Johnny Bridge, MD;  Location: [place];  Service: Orthopedics;  Laterality: Left;  . Colonoscopy N/A 10/28/2012    Procedure: COLONOSCOPY;  Surgeon: Jeryl Columbia, MD;  Location: [facility] ENDOSCOPY;  Service: Endoscopy;  Laterality: N/A;  . Esophagogastroduodenoscopy N/A 11/02/2012    Procedure: ESOPHAGOGASTRODUODENOSCOPY (EGD);  Surgeon: Cleotis Nipper, MD;  Location[facility] ENDOSCOPY;  Service: Endoscopy;  Laterality: N/A;  . Colonoscopy N/A 11/02/2012    Procedure: COLONOSCOPY;  Surgeon: Cleotis Nipper, MD;  Location: [facility] ENDOSCOPY;  Service: Endoscopy;  Laterality: N/A;  . Colonoscopy N/A 11/03/2012    Procedure: COLONOSCOPY;  Surgeon: Cleotis Nipper, MD;  Location: [facility] ENDOSCOPY;  Service: Endoscopy;  Laterality: N/A;  . Givens capsule study N/A 11/04/2012    Procedure: GIVENS CAPSULE STUDY;  Surgeon: Cleotis Nipper, MD;  Location: [facility] ENDOSCOPY;  Service: Endoscopy;  Laterality: N/A;  . Enteroscopy N/A 11/08/2012    Procedure: ENTEROSCOPY;  Surgeon: Wonda Horner, MD;  Location: [facility] ENDOSCOPY;  Service: Endoscopy;  Laterality: N/A;  . Amputation Left 05/12/2013    Procedure: AMPUTATION RAY;  Surgeon: Newt Minion, MD;  Location: [place];  Service: Orthopedics;  Laterality: Left;  Left Foot  1st Ray Amputation  . Eye surgery Left     to remove scar tissue  . Amputation Left 06/09/2013    Procedure: AMPUTATION BELOW KNEE;  Surgeon: Newt Minion, MD;  Location: [place];  Service: Orthopedics;  Laterality: Left;  Left Below Knee Amputation and removal proximal screws IM tibial nail  . Hardware removal Left 06/09/2013    Procedure: HARDWARE REMOVAL;  Surgeon: Newt Minion, MD;  Location: [place];  Service: Orthopedics;  Laterality: Left;  Left Below Knee  Amputation  and Removal proximal screws IM tibial nail  . Amputation Right 09/08/2013    Procedure: AMPUTATION BELOW KNEE;  Surgeon: Newt Minion, MD;  Location: [place];  Service: Orthopedics;  Laterality: Right;  Right Below Knee Amputation  . Amputation Right 10/11/2013    Procedure: AMPUTATION BELOW KNEE;  Surgeon: Newt Minion, MD;  Location: [place];  Service: Orthopedics;  Laterality: Right;  Right Below Knee Amputation Revision    There were no vitals filed for this visit.  Visit Diagnosis:  Generalized weakness  Abnormality of gait  Impaired functional mobility and activity tolerance      Subjective Assessment - 10/29/14 1106    Subjective No new complaints. No new falls.   Currently in Pain? Yes   Pain Score 5    Pain Location Arm   Pain Orientation Right;Left   Pain Descriptors / Indicators Aching   Pain Type Chronic pain   Pain Onset In the past 7 days   Pain Frequency Intermittent   Aggravating Factors  moving and working in therapy   Pain Relieving Factors rest            [facility] Adult PT Treatment/Exercise - 10/29/14 1136    Transfers   Sit to Stand 3: Mod assist;4: Min assist;From chair/3-in-1;With upper extremity assist;With armrests   Sit to Stand: Patient Percentage 50%   Sit to Stand Details (indicate cue type and reason) in parallel bars x 5 reps, decreased assistance needed as reps progressed   Stand to Sit 3: Mod assist;With upper extremity assist;To chair/3-in-1;With armrests   Stand to Sit Details cues to reach back. assist needed to control descent with sittting down   Lateral/Scoot Transfers 4: Min assist   Lateral/Scoot Transfer Details (indicate cue type and reason) wheelchair <> nustep   Ambulation/Gait   Ambulation/Gait Yes   Ambulation/Gait Assistance 3: Mod assist  + person following with chair for safety   Ambulation/Gait Assistance Details cues on posture and to advance hands with gait. pt with good foot clearance with each gait trial,  knees blocked in stance by therapist to prevent buckling.   Ambulation Distance (Feet) 8 Feet  x 3 reps   Assistive device Parallel bars   Gait Pattern Step-through pattern;Decreased stride length;Trunk flexed;Decreased step length - right;Decreased step length - left;Right flexed knee in stance;Left flexed knee in stance   Ambulation Surface Level;Indoor   Knee/Hip Exercises: Aerobic   Stationary Bike Nustep x 4 extremities level 3.0 x 8 minutes for strengthening and activity tolerance   Prosthetics   Current prosthetic wear tolerance (days/week)  7 days   Current prosthetic wear tolerance (#hours/day)  >80% of out of bed hours   Residual limb condition  scars present but no openings   Education Provided Residual limb care;Proper wear schedule/adjustment;Correct ply sock adjustment   Person(s) Educated Patient;Spouse   Education Method Explanation   Education Method Verbalized understanding   Donning Prosthesis Minimal assist   Doffing Prosthesis Minimal assist  PT Short Term Goals - 10/01/14 1415    PT SHORT TERM GOAL #1   Title donnes prostheses with minimal assist (Target Date: 10/02/14)   Time 1   Period Months   Status Partially Met   PT SHORT TERM GOAL #2   Title wife & patient verbalize adjusting wear schedule to maximize functional potential. (Target Date: 10/02/14)   Time 1   Period Months   Status Achieved   PT SHORT TERM GOAL #3   Title performs squat-pivot transfer w/c to level mat with minimal assist. (Target Date: 10/02/14)   Baseline MET 09/26/14   Time 1   Period Months   Status Achieved   PT SHORT TERM GOAL #4   Title Sit to/from stand w/c to parallel bars with moderate assist (Target Date: 10/02/14)   Baseline MET 09/26/14   Time 1   Period Months   Status Achieved   PT SHORT TERM GOAL #5   Title patient & wife demonstrate initial HEP correctly. (Target Date: 10/02/14)   Baseline MET 09/21/14   Time 1   Period Months   Status Achieved   Additional  Short Term Goals   Additional Short Term Goals Yes   PT SHORT TERM GOAL #6   Title reports wear of prostheses daily >80% of awake hours. (Target Date: 11/02/14)   Time 30   Period Days   Status New   PT SHORT TERM GOAL #7   Title Squat-pivot transfer with supervision. (Target Date: 11/02/14)   Time 30   Period Days   Status New   PT SHORT TERM GOAL #8   Title Sit to / from stand w/c to RW with moderate assist. (Target Date: 11/02/14)   Time 30   Period Days   Status New           PT Long Term Goals - 09/03/14 1015    PT LONG TERM GOAL #1   Title Patient and wife demonstrate proper prosthetic care. (Target Date: 01/02/15)   Time 4   Period Months   Status New   PT LONG TERM GOAL #2   Title tolerates wear of prostheses >90% of awake hours except at dialysis (Target Date: 01/02/15)   Time 4   Period Months   Status New   PT LONG TERM GOAL #3   Title stand pivot transfers with rolling walker & prostheses modified independent.  (Target Date: 01/02/15)   Time 4   Period Months   Status New   PT LONG TERM GOAL #4   Title manages clothes, reaches 5" and in upper /lower cabinets in standing with UE support modified independent.  (Target Date: 01/02/15)   Time 4   Period Months   Status New   PT LONG TERM GOAL #5   Title ambulate 25' with rolling walker & prostheses with minimal assist.  (Target Date: 01/02/15)   Time 4   Period Months   Status New            Plan - 10/29/14 1109    Clinical Impression Statement Pt with improved gait distance and posture today in parallel bars with less assistance needed. Progressing toward goals.   Pt will benefit from skilled therapeutic intervention in order to improve on the following deficits Abnormal gait;Decreased activity tolerance;Decreased balance;Decreased endurance;Decreased knowledge of use of DME;Decreased mobility;Decreased range of motion;Decreased strength;Difficulty walking;Other (comment)  prosthetic dependency   Rehab Potential  Good   Clinical Impairments Affecting Rehab Potential multiple medical issues over last  year causing decondtioning   PT Frequency 2x / week   PT Duration Other (comment)  4 months   PT Treatment/Interventions ADLs/Self Care Home Management;Gait training;Stair training;DME Instruction;Functional mobility training;Therapeutic activities;Therapeutic exercise;Balance training;Neuromuscular re-education;Patient/family education;Manual techniques;Other (comment)  prosthetic training   PT Next Visit Plan standing frame, transfers, standing with rolling walker, strength & endurance activities   Consulted and Agree with Plan of Care Patient;Family member/caregiver   Family Member Consulted wife        Problem List Patient Active Problem List   Diagnosis Date Noted  . Diarrhea 08/14/2014  . Dehydration 10/02/2013  . Fever 10/02/2013  . Altered mental status 10/01/2013  . Encephalopathy, toxic 10/01/2013  . Encephalopathy, metabolic 19/14/7829  . CVA (cerebral vascular accident) 09/28/2013  . FTT (failure to thrive) in adult 09/28/2013  . Acute confusional state 09/28/2013  . Ulcer of sacral region, stage 3 09/26/2013  . S/P BKA (below knee amputation) bilateral 09/08/2013  . ESRD on hemodialysis 05/09/2013  . TIA (transient ischemic attack) 02/20/2013  . Acute blood loss anemia 10/28/2012  . Chronic combined systolic (EF 56%) and grade 2diastolic congestive heart failure 10/28/2012  . Obstipation 10/28/2012  . GI bleed 10/27/2012  . Mass in rectum 10/27/2012  . DM (diabetes mellitus) type I controlled with renal manifestation 09/08/2012  . LVH (left ventricular hypertrophy)-severe concentric 06/13/2012  . Anemia due to chronic illness 06/12/2012  . Hyperlipidemia 01/30/2011  . CHOLELITHIASIS 08/01/2010  . BENIGN PROSTATIC HYPERTROPHY 08/01/2010  . CEREBROVASCULAR ACCIDENT, HX OF 08/06/2009  . Depression 03/18/2009  . Acute combined systolic and diastolic heart failure, NYHA class  2-EF 45% 03/18/2009  . NEPHROLITHIASIS, HX OF 03/18/2009  . Morbid obesity 03/25/2007  . Essential hypertension 03/25/2007  . GERD 03/25/2007  . HEPATITIS C, HX OF 03/25/2007    Willow Ora 10/29/2014, 3:02 PM  Willow Ora, PTA, [place] [address], [place], [place] [address] [phone] 10/29/2014, 3:03 PM

## 2014-10-30 DIAGNOSIS — N2581 Secondary hyperparathyroidism of renal origin: Secondary | ICD-10-CM | POA: Diagnosis not present

## 2014-10-30 DIAGNOSIS — N186 End stage renal disease: Secondary | ICD-10-CM | POA: Diagnosis not present

## 2014-10-30 DIAGNOSIS — E1122 Type 2 diabetes mellitus with diabetic chronic kidney disease: Secondary | ICD-10-CM | POA: Diagnosis not present

## 2014-10-31 ENCOUNTER — Encounter: Payer: Self-pay | Admitting: Physical Therapy

## 2014-10-31 ENCOUNTER — Ambulatory Visit: Payer: Medicare Other | Admitting: Physical Therapy

## 2014-10-31 ENCOUNTER — Encounter: Payer: Self-pay | Admitting: Occupational Therapy

## 2014-10-31 DIAGNOSIS — Z7409 Other reduced mobility: Secondary | ICD-10-CM | POA: Diagnosis not present

## 2014-10-31 DIAGNOSIS — Z89512 Acquired absence of left leg below knee: Secondary | ICD-10-CM

## 2014-10-31 DIAGNOSIS — Z89511 Acquired absence of right leg below knee: Secondary | ICD-10-CM | POA: Diagnosis not present

## 2014-10-31 DIAGNOSIS — R269 Unspecified abnormalities of gait and mobility: Secondary | ICD-10-CM

## 2014-10-31 DIAGNOSIS — R531 Weakness: Secondary | ICD-10-CM | POA: Diagnosis not present

## 2014-10-31 DIAGNOSIS — M24669 Ankylosis, unspecified knee: Secondary | ICD-10-CM

## 2014-10-31 DIAGNOSIS — R5381 Other malaise: Secondary | ICD-10-CM | POA: Diagnosis not present

## 2014-10-31 DIAGNOSIS — R4189 Other symptoms and signs involving cognitive functions and awareness: Secondary | ICD-10-CM | POA: Diagnosis not present

## 2014-10-31 DIAGNOSIS — R6889 Other general symptoms and signs: Secondary | ICD-10-CM

## 2014-10-31 DIAGNOSIS — R2689 Other abnormalities of gait and mobility: Secondary | ICD-10-CM

## 2014-10-31 NOTE — Therapy (Signed)
[facility] [address] [place], [place], [address] Phone: [phone]   Fax:  [phone]  Physical Therapy Treatment  Patient Details  Name: Oscar Castillo MRN: [id number] Date of Birth: 1951/06/25 Referring Provider:  [name], MD  Encounter Date: 10/31/2014      PT End of Session - 10/31/14 1145    Visit Number 17  number adjusted to correct number due to miscount during previous sessions   Number of Visits 34   Date for PT Re-Evaluation 11/02/14   PT Start Time 1102   PT Stop Time 1145   PT Time Calculation (min) 43 min   Equipment Utilized During Treatment Gait belt   Activity Tolerance Patient limited by fatigue   Behavior During Therapy [facility] for tasks assessed/performed      Past Medical History  Diagnosis Date  . ESRD on hemodialysis 05/05/2007    ESRD due to DM/HTN. Started dialysis in November 2013.  HD TTS at [facility] on [place].  [name] BACK PAIN, LUMBAR, CHRONIC 08/06/2009  . BENIGN PROSTATIC HYPERTROPHY 08/01/2010  . CEREBROVASCULAR ACCIDENT, HX OF 08/06/2009  . CHOLELITHIASIS 08/01/2010  . CONGESTIVE HEART FAILURE 03/18/2009  . DEPRESSION 03/18/2009  . DIABETES MELLITUS, TYPE II 03/25/2007  . ERECTILE DYSFUNCTION 03/25/2007  . GERD 03/25/2007  . HEPATITIS C, HX OF 03/25/2007  . HYPERTENSION 03/25/2007  . Morbid obesity 03/25/2007  . NEPHROLITHIASIS, HX OF 03/18/2009  . Complication of anesthesia     wife states pt had trouble waking up with his last surgery in Nov., 2014    Past Surgical History  Procedure Laterality Date  . Nephrectomy      partial RR  . Av fistula placement  06/14/2012    Procedure: ARTERIOVENOUS (AV) FISTULA CREATION;  Surgeon: Angelia Mould, MD;  Location: [facility] OR;  Service: Vascular;  Laterality: Left;  Left basilic vein transposition with fistula.  . Tibia im nail insertion Left 09/09/2012    Procedure: INTRAMEDULLARY (IM) NAIL TIBIAL;  Surgeon: Johnny Bridge, MD;   Location: [place];  Service: Orthopedics;  Laterality: Left;  left tibial nail and open reduction internal fixation left fibula fracture  . Orif fibula fracture Left 09/09/2012    Procedure: OPEN REDUCTION INTERNAL FIXATION (ORIF) FIBULA FRACTURE;  Surgeon: Johnny Bridge, MD;  Location: [place];  Service: Orthopedics;  Laterality: Left;  . Colonoscopy N/A 10/28/2012    Procedure: COLONOSCOPY;  Surgeon: Jeryl Columbia, MD;  Location: [facility] ENDOSCOPY;  Service: Endoscopy;  Laterality: N/A;  . Esophagogastroduodenoscopy N/A 11/02/2012    Procedure: ESOPHAGOGASTRODUODENOSCOPY (EGD);  Surgeon: Cleotis Nipper, MD;  Location: [facility] ENDOSCOPY;  Service: Endoscopy;  Laterality: N/A;  . Colonoscopy N/A 11/02/2012    Procedure: COLONOSCOPY;  Surgeon: Cleotis Nipper, MD;  Location: [facility] ENDOSCOPY;  Service: Endoscopy;  Laterality: N/A;  . Colonoscopy N/A 11/03/2012    Procedure: COLONOSCOPY;  Surgeon: Cleotis Nipper, MD;  Location: [facility] ENDOSCOPY;  Service: Endoscopy;  Laterality: N/A;  . Givens capsule study N/A 11/04/2012    Procedure: GIVENS CAPSULE STUDY;  Surgeon: Cleotis Nipper, MD;  Location: [facility] ENDOSCOPY;  Service: Endoscopy;  Laterality: N/A;  . Enteroscopy N/A 11/08/2012    Procedure: ENTEROSCOPY;  Surgeon: Wonda Horner, MD;  Location: [facility] ENDOSCOPY;  Service: Endoscopy;  Laterality: N/A;  . Amputation Left 05/12/2013    Procedure: AMPUTATION RAY;  Surgeon: Newt Minion, MD;  Location: [place];  Service: Orthopedics;  Laterality: Left;  Left Foot  1st Ray Amputation  . Eye surgery Left     to remove scar tissue  . Amputation Left 06/09/2013    Procedure: AMPUTATION BELOW KNEE;  Surgeon: Newt Minion, MD;  Location: [place];  Service: Orthopedics;  Laterality: Left;  Left Below Knee Amputation and removal proximal screws IM tibial nail  . Hardware removal Left 06/09/2013    Procedure: HARDWARE REMOVAL;  Surgeon: Newt Minion, MD;  Location: [place];  Service: Orthopedics;  Laterality: Left;  Left Below Knee  Amputation  and Removal proximal screws IM tibial nail  . Amputation Right 09/08/2013    Procedure: AMPUTATION BELOW KNEE;  Surgeon: Newt Minion, MD;  Location: [place];  Service: Orthopedics;  Laterality: Right;  Right Below Knee Amputation  . Amputation Right 10/11/2013    Procedure: AMPUTATION BELOW KNEE;  Surgeon: Newt Minion, MD;  Location: [place];  Service: Orthopedics;  Laterality: Right;  Right Below Knee Amputation Revision    There were no vitals filed for this visit.  Visit Diagnosis:  Generalized weakness  Abnormality of gait  Balance problems  Status post bilateral below knee amputation  Decreased range of knee movement, unspecified laterality  Decreased functional activity tolerance      Subjective Assessment - 10/31/14 1107    Subjective dialysis was rough yesterday. Head and neck are stiff.   Currently in Pain? No/denies                       [facility] Adult PT Treatment/Exercise - 10/31/14 1100    Transfers   Sit to Stand 4: Min assist;From chair/3-in-1;With upper extremity assist;With armrests  pushing on parallel bars or RW   Sit to Stand: Patient Percentage 70%   Sit to Stand Details (indicate cue type and reason) PT demo technique change, verbal cues on trunk positioning   Stand to Sit With upper extremity assist;To chair/3-in-1;With armrests;4: Min assist   Lateral/Scoot Transfers 4: Min guard  level with armrests flipped up   Ambulation/Gait   Ambulation/Gait Yes   Ambulation/Gait Assistance 3: Mod assist  2 people for safety + person following with chair for safety   Ambulation/Gait Assistance Details demo, verbal cues on pushing up, erect trunk /LEs prior to advancing either LE   Ambulation Distance (Feet) 5 Feet  5' X 1, 10' X 1   Assistive device Prostheses;Rolling walker   Gait Pattern Step-through pattern;Decreased stride length;Trunk flexed;Decreased step length - right;Decreased step length - left;Right flexed knee in stance;Left  flexed knee in stance;Decreased hip/knee flexion - right;Decreased hip/knee flexion - left   Ambulation Surface Level;Indoor   Knee/Hip Exercises: Aerobic   Stationary Bike Nustep x 4 extremities level 3.0 x 4 minutes for strengthening and activity tolerance   Prosthetics   Current prosthetic wear tolerance (days/week)  7 days   Current prosthetic wear tolerance (#hours/day)  >60% of out of bed hours   Residual limb condition  scars present but no openings   Education Provided Residual limb care;Proper wear schedule/adjustment   Person(s) Educated Patient;Spouse   Education Method Explanation   Education Method Verbalized understanding;Needs further instruction   Donning Prosthesis Minimal assist   Doffing Prosthesis Minimal assist                  PT Short Term Goals - 10/31/14 1145    PT SHORT TERM GOAL #1   Title donnes prostheses with minimal assist (Target Date: 10/02/14)   Baseline MET 10/31/14   Time  1   Period Months   Status Achieved   PT SHORT TERM GOAL #2   Title wife & patient verbalize adjusting wear schedule to maximize functional potential. (Target Date: 10/02/14)   Time 1   Period Months   Status Achieved   PT SHORT TERM GOAL #3   Title performs squat-pivot transfer w/c to level mat with minimal assist. (Target Date: 10/02/14)   Baseline MET 09/26/14   Time 1   Period Months   Status Achieved   PT SHORT TERM GOAL #4   Title Sit to/from stand w/c to parallel bars with moderate assist (Target Date: 10/02/14)   Baseline MET 09/26/14   Time 1   Period Months   Status Achieved   PT SHORT TERM GOAL #5   Title patient & wife demonstrate initial HEP correctly. (Target Date: 10/02/14)   Baseline MET 09/21/14   Time 1   Period Months   Status Achieved   Additional Short Term Goals   Additional Short Term Goals Yes   PT SHORT TERM GOAL #6   Title reports wear of prostheses daily >80% of awake hours. (Target Date: 11/02/14)   Baseline Partially MET Patient wears  prostheses >80% of out of bed time which is ~60% of awake hours.   Time 30   Period Days   Status Partially Met   PT SHORT TERM GOAL #7   Title Squat-pivot transfer with supervision. (Target Date: 11/02/14)   Baseline Partially MET 10/31/14 Squat-pivot transfer with contact assist for safety without physical assist.   Time 30   Period Days   Status Partially Met   PT SHORT TERM GOAL #8   Title Sit to / from stand w/c to RW with moderate assist. (Target Date: 11/02/14)   Baseline MET 10/31/14   Time 30   Period Days   Status New   PT SHORT TERM GOAL #9   TITLE stand-pivot transfer including sit to/from stand with rolling walker and prostheses with modA. (Target Date:11/30/2014)   Time 4   Period Weeks   Status New   PT SHORT TERM GOAL #10   TITLE ambulates 20' with rolling walker and prostheses with moderate assist. (Target Date:11/30/2014)   Time 4   Period Weeks   Status New   PT SHORT TERM GOAL #11   TITLE reports wear >80% of out of bed awake hours except dialysis without skin issues or pain. (Target Date:11/30/2014)   Time 4   Period Weeks   Status New           PT Long Term Goals - 10/31/14 1145    PT LONG TERM GOAL #1   Title Patient and wife demonstrate proper prosthetic care. (Target Date: 01/02/15)   Time 4   Period Months   Status New   PT LONG TERM GOAL #2   Title tolerates wear of prostheses >90% of awake hours except at dialysis (Target Date: 01/02/15)   Time 4   Period Months   Status On-going   PT LONG TERM GOAL #3   Title stand pivot transfers with rolling walker & prostheses with supervision.  (Target Date: 01/02/15)   Time 4   Period Months   Status Revised   PT LONG TERM GOAL #4   Title manages clothes, reaches 5" and in upper /lower cabinets in standing with UE support with minimal assist.  (Target Date: 01/02/15)   Time 4   Period Months   Status Revised   PT LONG TERM GOAL #  5   Title ambulate 77' with rolling walker & prostheses with minimal assist.  (Target  Date: 01/02/15)   Time 4   Period Months   Status Revised               Plan - 10/31/14 1100    Clinical Impression Statement Patient met or partially met all short term goals set for this certification period. This is end of 60 day Medicare certification period. PT initially anticipated 4 months of PT needed due to initial health condition. Patient is on target and continues to make slow progress towards mobility with bilateral prostheses. His wife already reports less assistance and strain on her to help her husband function.   Pt will benefit from skilled therapeutic intervention in order to improve on the following deficits Abnormal gait;Decreased activity tolerance;Decreased balance;Decreased endurance;Decreased knowledge of use of DME;Decreased mobility;Decreased range of motion;Decreased strength;Difficulty walking;Other (comment)  prosthetic dependency   Rehab Potential Good   Clinical Impairments Affecting Rehab Potential multiple medical issues over last year causing decondtioning   PT Frequency 2x / week   PT Duration Other (comment)  4 months   PT Treatment/Interventions ADLs/Self Care Home Management;Gait training;Stair training;DME Instruction;Functional mobility training;Therapeutic activities;Therapeutic exercise;Balance training;Neuromuscular re-education;Patient/family education;Manual techniques;Other (comment)  prosthetic training   PT Next Visit Plan try standing at sink, if able issue for home with wife, standing frame, transfers, standing with rolling walker, strength & endurance activities   Consulted and Agree with Plan of Care Patient;Family member/caregiver   Family Member Consulted wife        Problem List Patient Active Problem List   Diagnosis Date Noted  . Diarrhea 08/14/2014  . Dehydration 10/02/2013  . Fever 10/02/2013  . Altered mental status 10/01/2013  . Encephalopathy, toxic 10/01/2013  . Encephalopathy, metabolic 62/83/1517  . CVA  (cerebral vascular accident) 09/28/2013  . FTT (failure to thrive) in adult 09/28/2013  . Acute confusional state 09/28/2013  . Ulcer of sacral region, stage 3 09/26/2013  . S/P BKA (below knee amputation) bilateral 09/08/2013  . ESRD on hemodialysis 05/09/2013  . TIA (transient ischemic attack) 02/20/2013  . Acute blood loss anemia 10/28/2012  . Chronic combined systolic (EF 61%) and grade 2diastolic congestive heart failure 10/28/2012  . Obstipation 10/28/2012  . GI bleed 10/27/2012  . Mass in rectum 10/27/2012  . DM (diabetes mellitus) type I controlled with renal manifestation 09/08/2012  . LVH (left ventricular hypertrophy)-severe concentric 06/13/2012  . Anemia due to chronic illness 06/12/2012  . Hyperlipidemia 01/30/2011  . CHOLELITHIASIS 08/01/2010  . BENIGN PROSTATIC HYPERTROPHY 08/01/2010  . CEREBROVASCULAR ACCIDENT, HX OF 08/06/2009  . Depression 03/18/2009  . Acute combined systolic and diastolic heart failure, NYHA class 2-EF 45% 03/18/2009  . NEPHROLITHIASIS, HX OF 03/18/2009  . Morbid obesity 03/25/2007  . Essential hypertension 03/25/2007  . GERD 03/25/2007  . HEPATITIS C, HX OF 03/25/2007    Jamey Reas PT, DPT 10/31/2014, 9:31 PM  [facility] [address] [place], [place], [address] Phone: [phone]   Fax:  [phone]

## 2014-11-01 DIAGNOSIS — E1122 Type 2 diabetes mellitus with diabetic chronic kidney disease: Secondary | ICD-10-CM | POA: Diagnosis not present

## 2014-11-01 DIAGNOSIS — N2581 Secondary hyperparathyroidism of renal origin: Secondary | ICD-10-CM | POA: Diagnosis not present

## 2014-11-01 DIAGNOSIS — N186 End stage renal disease: Secondary | ICD-10-CM | POA: Diagnosis not present

## 2014-11-03 DIAGNOSIS — N2581 Secondary hyperparathyroidism of renal origin: Secondary | ICD-10-CM | POA: Diagnosis not present

## 2014-11-03 DIAGNOSIS — N186 End stage renal disease: Secondary | ICD-10-CM | POA: Diagnosis not present

## 2014-11-03 DIAGNOSIS — E1122 Type 2 diabetes mellitus with diabetic chronic kidney disease: Secondary | ICD-10-CM | POA: Diagnosis not present

## 2014-11-06 DIAGNOSIS — N2581 Secondary hyperparathyroidism of renal origin: Secondary | ICD-10-CM | POA: Diagnosis not present

## 2014-11-06 DIAGNOSIS — N186 End stage renal disease: Secondary | ICD-10-CM | POA: Diagnosis not present

## 2014-11-06 DIAGNOSIS — E1122 Type 2 diabetes mellitus with diabetic chronic kidney disease: Secondary | ICD-10-CM | POA: Diagnosis not present

## 2014-11-07 ENCOUNTER — Encounter: Payer: Self-pay | Admitting: Physical Therapy

## 2014-11-07 ENCOUNTER — Ambulatory Visit: Payer: Medicare Other | Admitting: Physical Therapy

## 2014-11-07 DIAGNOSIS — R269 Unspecified abnormalities of gait and mobility: Secondary | ICD-10-CM

## 2014-11-07 DIAGNOSIS — R5381 Other malaise: Secondary | ICD-10-CM | POA: Diagnosis not present

## 2014-11-07 DIAGNOSIS — Z89512 Acquired absence of left leg below knee: Secondary | ICD-10-CM | POA: Diagnosis not present

## 2014-11-07 DIAGNOSIS — R2689 Other abnormalities of gait and mobility: Secondary | ICD-10-CM

## 2014-11-07 DIAGNOSIS — R531 Weakness: Secondary | ICD-10-CM | POA: Diagnosis not present

## 2014-11-07 DIAGNOSIS — Z7409 Other reduced mobility: Secondary | ICD-10-CM | POA: Diagnosis not present

## 2014-11-07 DIAGNOSIS — Z89511 Acquired absence of right leg below knee: Secondary | ICD-10-CM | POA: Diagnosis not present

## 2014-11-07 DIAGNOSIS — R4189 Other symptoms and signs involving cognitive functions and awareness: Secondary | ICD-10-CM | POA: Diagnosis not present

## 2014-11-07 NOTE — Therapy (Signed)
[facility] [address] [place], [place], [address] Phone: [phone]   Fax:  [phone]  Physical Therapy Treatment  Patient Details  Name: Oscar Castillo MRN: [id number] Date of Birth: 14-Sep-1950 Referring Provider:  [name], MD  Encounter Date: 11/07/2014      PT End of Session - 11/07/14 1239    Visit Number 18    Number of Visits 34   Date for PT Re-Evaluation 11/02/14   PT Start Time 1233   PT Stop Time 1314   PT Time Calculation (min) 41 min   Equipment Utilized During Treatment Gait belt   Activity Tolerance Patient limited by fatigue   Behavior During Therapy [facility] for tasks assessed/performed      Past Medical History  Diagnosis Date  . ESRD on hemodialysis 05/05/2007    ESRD due to DM/HTN. Started dialysis in November 2013.  HD TTS at [facility] on [place].  [name] BACK PAIN, LUMBAR, CHRONIC 08/06/2009  . BENIGN PROSTATIC HYPERTROPHY 08/01/2010  . CEREBROVASCULAR ACCIDENT, HX OF 08/06/2009  . CHOLELITHIASIS 08/01/2010  . CONGESTIVE HEART FAILURE 03/18/2009  . DEPRESSION 03/18/2009  . DIABETES MELLITUS, TYPE II 03/25/2007  . ERECTILE DYSFUNCTION 03/25/2007  . GERD 03/25/2007  . HEPATITIS C, HX OF 03/25/2007  . HYPERTENSION 03/25/2007  . Morbid obesity 03/25/2007  . NEPHROLITHIASIS, HX OF 03/18/2009  . Complication of anesthesia     wife states pt had trouble waking up with his last surgery in Nov., 2014    Past Surgical History  Procedure Laterality Date  . Nephrectomy      partial RR  . Av fistula placement  06/14/2012    Procedure: ARTERIOVENOUS (AV) FISTULA CREATION;  Surgeon: Angelia Mould, MD;  Location: [facility] OR;  Service: Vascular;  Laterality: Left;  Left basilic vein transposition with fistula.  . Tibia im nail insertion Left 09/09/2012    Procedure: INTRAMEDULLARY (IM) NAIL TIBIAL;  Surgeon: Johnny Bridge, MD;  Location: [place];  Service: Orthopedics;  Laterality: Left;  left tibial  nail and open reduction internal fixation left fibula fracture  . Orif fibula fracture Left 09/09/2012    Procedure: OPEN REDUCTION INTERNAL FIXATION (ORIF) FIBULA FRACTURE;  Surgeon: Johnny Bridge, MD;  Location: [place];  Service: Orthopedics;  Laterality: Left;  . Colonoscopy N/A 10/28/2012    Procedure: COLONOSCOPY;  Surgeon: Jeryl Columbia, MD;  Location: [facility] ENDOSCOPY;  Service: Endoscopy;  Laterality: N/A;  . Esophagogastroduodenoscopy N/A 11/02/2012    Procedure: ESOPHAGOGASTRODUODENOSCOPY (EGD);  Surgeon: Cleotis Nipper, MD;  Location: [facility] ENDOSCOPY;  Service: Endoscopy;  Laterality: N/A;  . Colonoscopy N/A 11/02/2012    Procedure: COLONOSCOPY;  Surgeon: Cleotis Nipper, MD;  Location: [facility] ENDOSCOPY;  Service: Endoscopy;  Laterality: N/A;  . Colonoscopy N/A 11/03/2012    Procedure: COLONOSCOPY;  Surgeon: Cleotis Nipper, MD;  Location: [facility] ENDOSCOPY;  Service: Endoscopy;  Laterality: N/A;  . Givens capsule study N/A 11/04/2012    Procedure: GIVENS CAPSULE STUDY;  Surgeon: Cleotis Nipper, MD;  Location: [facility] ENDOSCOPY;  Service: Endoscopy;  Laterality: N/A;  . Enteroscopy N/A 11/08/2012    Procedure: ENTEROSCOPY;  Surgeon: Wonda Horner, MD;  Location: [facility] ENDOSCOPY;  Service: Endoscopy;  Laterality: N/A;  . Amputation Left 05/12/2013    Procedure: AMPUTATION RAY;  Surgeon: Newt Minion, MD;  Location: [place];  Service: Orthopedics;  Laterality: Left;  Left Foot 1st Ray Amputation  . Eye surgery Left  to remove scar tissue  . Amputation Left 06/09/2013    Procedure: AMPUTATION BELOW KNEE;  Surgeon: Newt Minion, MD;  Location: [place];  Service: Orthopedics;  Laterality: Left;  Left Below Knee Amputation and removal proximal screws IM tibial nail  . Hardware removal Left 06/09/2013    Procedure: HARDWARE REMOVAL;  Surgeon: Newt Minion, MD;  Location: [place];  Service: Orthopedics;  Laterality: Left;  Left Below Knee Amputation  and Removal proximal screws IM tibial nail  . Amputation Right  09/08/2013    Procedure: AMPUTATION BELOW KNEE;  Surgeon: Newt Minion, MD;  Location: [place];  Service: Orthopedics;  Laterality: Right;  Right Below Knee Amputation  . Amputation Right 10/11/2013    Procedure: AMPUTATION BELOW KNEE;  Surgeon: Newt Minion, MD;  Location: [place];  Service: Orthopedics;  Laterality: Right;  Right Below Knee Amputation Revision    There were no vitals filed for this visit.  Visit Diagnosis:  Generalized weakness  Abnormality of gait  Balance problems      Subjective Assessment - 11/07/14 1237    Subjective No falls. Rough time with dialysis yesterday. Reports his fistula is stopped up and that he is going Friday am to have it checked to see what is blocking it up.    Currently in Pain? No/denies   Pain Score 0-No pain          [facility] Adult PT Treatment/Exercise - 11/07/14 1241    Transfers   Sit to Stand 4: Min assist;3: Mod assist   Stand to Sit 4: Min assist;3: Mod Wellsite geologist Standing - Balance Support Bilateral upper extremity supported;During functional activity   Static Standing - Level of Assistance 3: Mod assist  min guard assist with standing frame   Static Standing - Comment/# of Minutes 15-20 sec's x 3 reps at sink with bil UE support; standing frame with min guard assist to supervision:                     Knee/Hip Exercises: Stretches   Passive Hamstring Stretch 30 seconds;2 reps  each leg   Passive Hamstring Stretch Limitations with prosthesis on and propped onto therapist leg with therapist on rolling stool, gentle overpressure to knee for increase extension stretching.                    While standing in frame (x 3 reps) - alternating UE raises - alternating upper trunk rotation left/right  - "W"'s - alternating reaching laterally out of base support toward hand target's - working on upright posture with hip extension and bil knee extension        PT Short Term Goals - 10/31/14 1145    PT  SHORT TERM GOAL #1   Title donnes prostheses with minimal assist (Target Date: 10/02/14)   Baseline MET 10/31/14   Time 1   Period Months   Status Achieved   PT SHORT TERM GOAL #2   Title wife & patient verbalize adjusting wear schedule to maximize functional potential. (Target Date: 10/02/14)   Time 1   Period Months   Status Achieved   PT SHORT TERM GOAL #3   Title performs squat-pivot transfer w/c to level mat with minimal assist. (Target Date: 10/02/14)   Baseline MET 09/26/14   Time 1   Period Months   Status Achieved   PT SHORT TERM GOAL #4   Title Sit to/from  stand w/c to parallel bars with moderate assist (Target Date: 10/02/14)   Baseline MET 09/26/14   Time 1   Period Months   Status Achieved   PT SHORT TERM GOAL #5   Title patient & wife demonstrate initial HEP correctly. (Target Date: 10/02/14)   Baseline MET 09/21/14   Time 1   Period Months   Status Achieved   Additional Short Term Goals   Additional Short Term Goals Yes   PT SHORT TERM GOAL #6   Title reports wear of prostheses daily >80% of awake hours. (Target Date: 11/02/14)   Baseline Partially MET Patient wears prostheses >80% of out of bed time which is ~60% of awake hours.   Time 30   Period Days   Status Partially Met   PT SHORT TERM GOAL #7   Title Squat-pivot transfer with supervision. (Target Date: 11/02/14)   Baseline Partially MET 10/31/14 Squat-pivot transfer with contact assist for safety without physical assist.   Time 30   Period Days   Status Partially Met   PT SHORT TERM GOAL #8   Title Sit to / from stand w/c to RW with moderate assist. (Target Date: 11/02/14)   Baseline MET 10/31/14   Time 30   Period Days   Status New   PT SHORT TERM GOAL #9   TITLE stand-pivot transfer including sit to/from stand with rolling walker and prostheses with modA. (Target Date:11/30/2014)   Time 4   Period Weeks   Status New   PT SHORT TERM GOAL #10   TITLE ambulates 20' with rolling walker and prostheses with moderate  assist. (Target Date:11/30/2014)   Time 4   Period Weeks   Status New   PT SHORT TERM GOAL #11   TITLE reports wear >80% of out of bed awake hours except dialysis without skin issues or pain. (Target Date:11/30/2014)   Time 4   Period Weeks   Status New           PT Long Term Goals - 10/31/14 1145    PT LONG TERM GOAL #1   Title Patient and wife demonstrate proper prosthetic care. (Target Date: 01/02/15)   Time 4   Period Months   Status New   PT LONG TERM GOAL #2   Title tolerates wear of prostheses >90% of awake hours except at dialysis (Target Date: 01/02/15)   Time 4   Period Months   Status On-going   PT LONG TERM GOAL #3   Title stand pivot transfers with rolling walker & prostheses with supervision.  (Target Date: 01/02/15)   Time 4   Period Months   Status Revised   PT LONG TERM GOAL #4   Title manages clothes, reaches 5" and in upper /lower cabinets in standing with UE support with minimal assist.  (Target Date: 01/02/15)   Time 4   Period Months   Status Revised   PT LONG TERM GOAL #5   Title ambulate 4' with rolling walker & prostheses with minimal assist.  (Target Date: 01/02/15)   Time 4   Period Months   Status Revised           Plan - 11/07/14 1239    Clinical Impression Statement Pt not safe at this time to stand at sink with spouse assist due to the amount of effort needed with standing and to maintain standing. Pt benefiting from standing frame for overall stretching and weight bearing through legs. Progressing toward goals.  Pt will benefit from skilled therapeutic intervention in order to improve on the following deficits Abnormal gait;Decreased activity tolerance;Decreased balance;Decreased endurance;Decreased knowledge of use of DME;Decreased mobility;Decreased range of motion;Decreased strength;Difficulty walking;Other (comment)  prosthetic dependency   Rehab Potential Good   Clinical Impairments Affecting  Rehab Potential multiple medical issues over last year causing decondtioning   PT Frequency 2x / week   PT Duration Other (comment)  4 months   PT Treatment/Interventions ADLs/Self Care Home Management;Gait training;Stair training;DME Instruction;Functional mobility training;Therapeutic activities;Therapeutic exercise;Balance training;Neuromuscular re-education;Patient/family education;Manual techniques;Other (comment)  prosthetic training   PT Next Visit Plan beging with sink standing for working towards this at home with spouse and then standing frame, transfers, standing with rolling walker, strength & endurance activities   Consulted and Agree with Plan of Care Patient;Family member/caregiver   Family Member Consulted wife        Problem List Patient Active Problem List   Diagnosis Date Noted  . Diarrhea 08/14/2014  . Dehydration 10/02/2013  . Fever 10/02/2013  . Altered mental status 10/01/2013  . Encephalopathy, toxic 10/01/2013  . Encephalopathy, metabolic 62/69/4854  . CVA (cerebral vascular accident) 09/28/2013  . FTT (failure to thrive) in adult 09/28/2013  . Acute confusional state 09/28/2013  . Ulcer of sacral region, stage 3 09/26/2013  . S/P BKA (below knee amputation) bilateral 09/08/2013  . ESRD on hemodialysis 05/09/2013  . TIA (transient ischemic attack) 02/20/2013  . Acute blood loss anemia 10/28/2012  . Chronic combined systolic (EF 62%) and grade 2diastolic congestive heart failure 10/28/2012  . Obstipation 10/28/2012  . GI bleed 10/27/2012  . Mass in rectum 10/27/2012  . DM (diabetes mellitus) type I controlled with renal manifestation 09/08/2012  . LVH (left ventricular hypertrophy)-severe concentric 06/13/2012  . Anemia due to chronic illness 06/12/2012  . Hyperlipidemia 01/30/2011  . CHOLELITHIASIS 08/01/2010  . BENIGN PROSTATIC HYPERTROPHY 08/01/2010  . CEREBROVASCULAR ACCIDENT, HX OF 08/06/2009  . Depression 03/18/2009  . Acute combined systolic  and diastolic heart failure, NYHA class 2-EF 45% 03/18/2009  . NEPHROLITHIASIS, HX OF 03/18/2009  . Morbid obesity 03/25/2007  . Essential hypertension 03/25/2007  . GERD 03/25/2007  . HEPATITIS C, HX OF 03/25/2007    Willow Ora 11/07/2014, 9:19 PM  Willow Ora, PTA, [facility] [address], [place], [place] [address] [phone] 11/07/2014, 9:19 PM

## 2014-11-08 DIAGNOSIS — N186 End stage renal disease: Secondary | ICD-10-CM | POA: Diagnosis not present

## 2014-11-08 DIAGNOSIS — E1122 Type 2 diabetes mellitus with diabetic chronic kidney disease: Secondary | ICD-10-CM | POA: Diagnosis not present

## 2014-11-08 DIAGNOSIS — N2581 Secondary hyperparathyroidism of renal origin: Secondary | ICD-10-CM | POA: Diagnosis not present

## 2014-11-09 ENCOUNTER — Ambulatory Visit: Payer: Medicare Other | Admitting: Physical Therapy

## 2014-11-09 ENCOUNTER — Encounter: Payer: Self-pay | Admitting: Physical Therapy

## 2014-11-09 DIAGNOSIS — Z89511 Acquired absence of right leg below knee: Secondary | ICD-10-CM | POA: Diagnosis not present

## 2014-11-09 DIAGNOSIS — Z89512 Acquired absence of left leg below knee: Secondary | ICD-10-CM | POA: Diagnosis not present

## 2014-11-09 DIAGNOSIS — R4189 Other symptoms and signs involving cognitive functions and awareness: Secondary | ICD-10-CM | POA: Diagnosis not present

## 2014-11-09 DIAGNOSIS — Z992 Dependence on renal dialysis: Secondary | ICD-10-CM | POA: Diagnosis not present

## 2014-11-09 DIAGNOSIS — R531 Weakness: Secondary | ICD-10-CM | POA: Diagnosis not present

## 2014-11-09 DIAGNOSIS — M24669 Ankylosis, unspecified knee: Secondary | ICD-10-CM

## 2014-11-09 DIAGNOSIS — R5381 Other malaise: Secondary | ICD-10-CM | POA: Diagnosis not present

## 2014-11-09 DIAGNOSIS — N186 End stage renal disease: Secondary | ICD-10-CM | POA: Diagnosis not present

## 2014-11-09 DIAGNOSIS — T82858D Stenosis of vascular prosthetic devices, implants and grafts, subsequent encounter: Secondary | ICD-10-CM | POA: Diagnosis not present

## 2014-11-09 DIAGNOSIS — R6889 Other general symptoms and signs: Secondary | ICD-10-CM

## 2014-11-09 DIAGNOSIS — Z7409 Other reduced mobility: Secondary | ICD-10-CM | POA: Diagnosis not present

## 2014-11-09 DIAGNOSIS — I871 Compression of vein: Secondary | ICD-10-CM | POA: Diagnosis not present

## 2014-11-09 NOTE — Therapy (Signed)
[facility] [address] [place], [place], [address] Phone: [phone]   Fax:  [phone]  Physical Therapy Treatment  Patient Details  Name: Oscar Castillo MRN: [id number] Date of Birth: June 16, 1951 Referring Provider:  [name], MD  Encounter Date: 11/09/2014      PT End of Session - 11/09/14 0852    Visit Number 5  [id number]   Number of Visits 34   Date for PT Re-Evaluation 11/02/14   PT Start Time 0848   PT Stop Time 0917   PT Time Calculation (min) 29 min   Equipment Utilized During Treatment Gait belt   Activity Tolerance Patient limited by fatigue;Patient limited by pain   Behavior During Therapy [facility] for tasks assessed/performed      Past Medical History  Diagnosis Date  . ESRD on hemodialysis 05/05/2007    ESRD due to DM/HTN. Started dialysis in November 2013.  HD TTS at [facility] [place].  [name] BACK PAIN, LUMBAR, CHRONIC 08/06/2009  . BENIGN PROSTATIC HYPERTROPHY 08/01/2010  . CEREBROVASCULAR ACCIDENT, HX OF 08/06/2009  . CHOLELITHIASIS 08/01/2010  . CONGESTIVE HEART FAILURE 03/18/2009  . DEPRESSION 03/18/2009  . DIABETES MELLITUS, TYPE II 03/25/2007  . ERECTILE DYSFUNCTION 03/25/2007  . GERD 03/25/2007  . HEPATITIS C, HX OF 03/25/2007  . HYPERTENSION 03/25/2007  . Morbid obesity 03/25/2007  . NEPHROLITHIASIS, HX OF 03/18/2009  . Complication of anesthesia     wife states pt had trouble waking up with his last surgery in Nov., 2014    Past Surgical History  Procedure Laterality Date  . Nephrectomy      partial RR  . Av fistula placement  06/14/2012    Procedure: ARTERIOVENOUS (AV) FISTULA CREATION;  Surgeon: Angelia Mould, MD;  Location: [facility] OR;  Service: Vascular;  Laterality: Left;  Left basilic vein transposition with fistula.  . Tibia im nail insertion Left 09/09/2012    Procedure: INTRAMEDULLARY (IM) NAIL TIBIAL;  Surgeon: Johnny Bridge, MD;  Location: [place];  Service: Orthopedics;   Laterality: Left;  left tibial nail and open reduction internal fixation left fibula fracture  . Orif fibula fracture Left 09/09/2012    Procedure: OPEN REDUCTION INTERNAL FIXATION (ORIF) FIBULA FRACTURE;  Surgeon: Johnny Bridge, MD;  Location: [place];  Service: Orthopedics;  Laterality: Left;  . Colonoscopy N/A 10/28/2012    Procedure: COLONOSCOPY;  Surgeon: Jeryl Columbia, MD;  Location: [facility] ENDOSCOPY;  Service: Endoscopy;  Laterality: N/A;  . Esophagogastroduodenoscopy N/A 11/02/2012    Procedure: ESOPHAGOGASTRODUODENOSCOPY (EGD);  Surgeon: Cleotis Nipper, MD;  Location: [facility] ENDOSCOPY;  Service: Endoscopy;  Laterality: N/A;  . Colonoscopy N/A 11/02/2012    Procedure: COLONOSCOPY;  Surgeon: Cleotis Nipper, MD;  Location: [facility] ENDOSCOPY;  Service: Endoscopy;  Laterality: N/A;  . Colonoscopy N/A 11/03/2012    Procedure: COLONOSCOPY;  Surgeon: Cleotis Nipper, MD;  Location: [facility] ENDOSCOPY;  Service: Endoscopy;  Laterality: N/A;  . Givens capsule study N/A 11/04/2012    Procedure: GIVENS CAPSULE STUDY;  Surgeon: Cleotis Nipper, MD;  Location: [facility] ENDOSCOPY;  Service: Endoscopy;  Laterality: N/A;  . Enteroscopy N/A 11/08/2012    Procedure: ENTEROSCOPY;  Surgeon: Wonda Horner, MD;  Location: [facility] ENDOSCOPY;  Service: Endoscopy;  Laterality: N/A;  . Amputation Left 05/12/2013    Procedure: AMPUTATION RAY;  Surgeon: Newt Minion, MD;  Location: [place];  Service: Orthopedics;  Laterality: Left;  Left Foot 1st Ray Amputation  . Eye surgery  Left     to remove scar tissue  . Amputation Left 06/09/2013    Procedure: AMPUTATION BELOW KNEE;  Surgeon: Newt Minion, MD;  Location: [place];  Service: Orthopedics;  Laterality: Left;  Left Below Knee Amputation and removal proximal screws IM tibial nail  . Hardware removal Left 06/09/2013    Procedure: HARDWARE REMOVAL;  Surgeon: Newt Minion, MD;  Location: [place];  Service: Orthopedics;  Laterality: Left;  Left Below Knee Amputation  and Removal proximal screws IM tibial  nail  . Amputation Right 09/08/2013    Procedure: AMPUTATION BELOW KNEE;  Surgeon: Newt Minion, MD;  Location: [place];  Service: Orthopedics;  Laterality: Right;  Right Below Knee Amputation  . Amputation Right 10/11/2013    Procedure: AMPUTATION BELOW KNEE;  Surgeon: Newt Minion, MD;  Location: [place];  Service: Orthopedics;  Laterality: Right;  Right Below Knee Amputation Revision    There were no vitals filed for this visit.  Visit Diagnosis:  Generalized weakness  Decreased range of knee movement, unspecified laterality  Decreased functional activity tolerance  Debility      Subjective Assessment - 11/09/14 0850    Subjective Feeling rought today, just left from having dialysis shunt scoped and blockages removed. Pt now resisted from using arm until stiches come out in dialysis tomorrow.    Currently in Pain? Yes   Pain Score 8    Pain Location Arm   Pain Orientation Right;Left   Pain Descriptors / Indicators Sore;Aching   Pain Type Acute pain   Pain Onset Today   Pain Frequency Rarely   Aggravating Factors  having shunt worked on   Pain Relieving Factors rest, plans to take medicine when he gets home           [facility] Adult PT Treatment/Exercise - 11/09/14 0901    Static Standing Balance   Static Standing - Balance Support Right upper extremity supported;During functional activity   Static Standing - Level of Assistance Other (comment)  used standing frame   Static Standing - Comment/# of Minutes x 2 reps, emphasis on knee extension with weight bearing: while up in frame worked on tall posture, hip extension, trunk extension and trunk rotation with no use of left arm due to new restrictions.                           Knee/Hip Exercises: Aerobic   Stationary Bike Scifit (from wheelchair with wheels wedged) x 3 extremities (no left UE), level 2.0 x 8 minutes            PT Short Term Goals - 10/31/14 1145    PT SHORT TERM GOAL #1   Title donnes prostheses with  minimal assist (Target Date: 10/02/14)   Baseline MET 10/31/14   Time 1   Period Months   Status Achieved   PT SHORT TERM GOAL #2   Title wife & patient verbalize adjusting wear schedule to maximize functional potential. (Target Date: 10/02/14)   Time 1   Period Months   Status Achieved   PT SHORT TERM GOAL #3   Title performs squat-pivot transfer w/c to level mat with minimal assist. (Target Date: 10/02/14)   Baseline MET 09/26/14   Time 1   Period Months   Status Achieved   PT SHORT TERM GOAL #4   Title Sit to/from stand w/c to parallel bars with moderate assist (Target Date: 10/02/14)   Baseline MET  09/26/14   Time 1   Period Months   Status Achieved   PT SHORT TERM GOAL #5   Title patient & wife demonstrate initial HEP correctly. (Target Date: 10/02/14)   Baseline MET 09/21/14   Time 1   Period Months   Status Achieved   Additional Short Term Goals   Additional Short Term Goals Yes   PT SHORT TERM GOAL #6   Title reports wear of prostheses daily >80% of awake hours. (Target Date: 11/02/14)   Baseline Partially MET Patient wears prostheses >80% of out of bed time which is ~60% of awake hours.   Time 30   Period Days   Status Partially Met   PT SHORT TERM GOAL #7   Title Squat-pivot transfer with supervision. (Target Date: 11/02/14)   Baseline Partially MET 10/31/14 Squat-pivot transfer with contact assist for safety without physical assist.   Time 30   Period Days   Status Partially Met   PT SHORT TERM GOAL #8   Title Sit to / from stand w/c to RW with moderate assist. (Target Date: 11/02/14)   Baseline MET 10/31/14   Time 30   Period Days   Status New   PT SHORT TERM GOAL #9   TITLE stand-pivot transfer including sit to/from stand with rolling walker and prostheses with modA. (Target Date:11/30/2014)   Time 4   Period Weeks   Status New   PT SHORT TERM GOAL #10   TITLE ambulates 20' with rolling walker and prostheses with moderate assist. (Target Date:11/30/2014)   Time 4   Period Weeks    Status New   PT SHORT TERM GOAL #11   TITLE reports wear >80% of out of bed awake hours except dialysis without skin issues or pain. (Target Date:11/30/2014)   Time 4   Period Weeks   Status New           PT Long Term Goals - 10/31/14 1145    PT LONG TERM GOAL #1   Title Patient and wife demonstrate proper prosthetic care. (Target Date: 01/02/15)   Time 4   Period Months   Status New   PT LONG TERM GOAL #2   Title tolerates wear of prostheses >90% of awake hours except at dialysis (Target Date: 01/02/15)   Time 4   Period Months   Status On-going   PT LONG TERM GOAL #3   Title stand pivot transfers with rolling walker & prostheses with supervision.  (Target Date: 01/02/15)   Time 4   Period Months   Status Revised   PT LONG TERM GOAL #4   Title manages clothes, reaches 5" and in upper /lower cabinets in standing with UE support with minimal assist.  (Target Date: 01/02/15)   Time 4   Period Months   Status Revised   PT LONG TERM GOAL #5   Title ambulate 77' with rolling walker & prostheses with minimal assist.  (Target Date: 01/02/15)   Time 4   Period Months   Status Revised           Plan - 11/09/14 0853    Clinical Impression Statement Pt limited by fatigue and pain today with all activity. Did achieve a more upright posture in the standing frame today, however decreased standing time due to pain. Slow progress toward goals today.   Pt will benefit from skilled therapeutic intervention in order to improve on the following deficits Abnormal gait;Decreased activity tolerance;Decreased balance;Decreased endurance;Decreased knowledge of use of DME;Decreased mobility;Decreased  range of motion;Decreased strength;Difficulty walking;Other (comment)  prosthetic dependency   Rehab Potential Good   Clinical Impairments Affecting Rehab Potential multiple medical issues over last year causing decondtioning   PT Frequency 2x / week   PT Duration Other (comment)  4 months   PT  Treatment/Interventions ADLs/Self Care Home Management;Gait training;Stair training;DME Instruction;Functional mobility training;Therapeutic activities;Therapeutic exercise;Balance training;Neuromuscular re-education;Patient/family education;Manual techniques;Other (comment)  prosthetic training   PT Next Visit Plan beging with sink standing for working towards this at home with spouse and then standing frame, transfers, standing with rolling walker, strength & endurance activities   Consulted and Agree with Plan of Care Patient;Family member/caregiver   Family Member Consulted wife        Problem List Patient Active Problem List   Diagnosis Date Noted  . Diarrhea 08/14/2014  . Dehydration 10/02/2013  . Fever 10/02/2013  . Altered mental status 10/01/2013  . Encephalopathy, toxic 10/01/2013  . Encephalopathy, metabolic 24/03/7352  . CVA (cerebral vascular accident) 09/28/2013  . FTT (failure to thrive) in adult 09/28/2013  . Acute confusional state 09/28/2013  . Ulcer of sacral region, stage 3 09/26/2013  . S/P BKA (below knee amputation) bilateral 09/08/2013  . ESRD on hemodialysis 05/09/2013  . TIA (transient ischemic attack) 02/20/2013  . Acute blood loss anemia 10/28/2012  . Chronic combined systolic (EF 29%) and grade 2diastolic congestive heart failure 10/28/2012  . Obstipation 10/28/2012  . GI bleed 10/27/2012  . Mass in rectum 10/27/2012  . DM (diabetes mellitus) type I controlled with renal manifestation 09/08/2012  . LVH (left ventricular hypertrophy)-severe concentric 06/13/2012  . Anemia due to chronic illness 06/12/2012  . Hyperlipidemia 01/30/2011  . CHOLELITHIASIS 08/01/2010  . BENIGN PROSTATIC HYPERTROPHY 08/01/2010  . CEREBROVASCULAR ACCIDENT, HX OF 08/06/2009  . Depression 03/18/2009  . Acute combined systolic and diastolic heart failure, NYHA class 2-EF 45% 03/18/2009  . NEPHROLITHIASIS, HX OF 03/18/2009  . Morbid obesity 03/25/2007  . Essential  hypertension 03/25/2007  . GERD 03/25/2007  . HEPATITIS C, HX OF 03/25/2007    Willow Ora 11/09/2014, 10:03 AM  Willow Ora, PTA, [facility] [address] [place], [place] [address] [phone] 11/09/2014, 10:03 AM

## 2014-11-10 DIAGNOSIS — N2581 Secondary hyperparathyroidism of renal origin: Secondary | ICD-10-CM | POA: Diagnosis not present

## 2014-11-10 DIAGNOSIS — E1122 Type 2 diabetes mellitus with diabetic chronic kidney disease: Secondary | ICD-10-CM | POA: Diagnosis not present

## 2014-11-10 DIAGNOSIS — N186 End stage renal disease: Secondary | ICD-10-CM | POA: Diagnosis not present

## 2014-11-13 DIAGNOSIS — N2581 Secondary hyperparathyroidism of renal origin: Secondary | ICD-10-CM | POA: Diagnosis not present

## 2014-11-13 DIAGNOSIS — N186 End stage renal disease: Secondary | ICD-10-CM | POA: Diagnosis not present

## 2014-11-13 DIAGNOSIS — E1122 Type 2 diabetes mellitus with diabetic chronic kidney disease: Secondary | ICD-10-CM | POA: Diagnosis not present

## 2014-11-14 ENCOUNTER — Ambulatory Visit: Payer: Medicare Other | Admitting: Physical Therapy

## 2014-11-14 ENCOUNTER — Encounter: Payer: Self-pay | Admitting: Physical Therapy

## 2014-11-14 DIAGNOSIS — Z89512 Acquired absence of left leg below knee: Secondary | ICD-10-CM

## 2014-11-14 DIAGNOSIS — R4189 Other symptoms and signs involving cognitive functions and awareness: Secondary | ICD-10-CM | POA: Diagnosis not present

## 2014-11-14 DIAGNOSIS — R6889 Other general symptoms and signs: Secondary | ICD-10-CM

## 2014-11-14 DIAGNOSIS — Z89511 Acquired absence of right leg below knee: Secondary | ICD-10-CM | POA: Diagnosis not present

## 2014-11-14 DIAGNOSIS — R531 Weakness: Secondary | ICD-10-CM

## 2014-11-14 DIAGNOSIS — R2689 Other abnormalities of gait and mobility: Secondary | ICD-10-CM

## 2014-11-14 DIAGNOSIS — R5381 Other malaise: Secondary | ICD-10-CM | POA: Diagnosis not present

## 2014-11-14 DIAGNOSIS — M24669 Ankylosis, unspecified knee: Secondary | ICD-10-CM

## 2014-11-14 DIAGNOSIS — Z7409 Other reduced mobility: Secondary | ICD-10-CM | POA: Diagnosis not present

## 2014-11-14 DIAGNOSIS — R269 Unspecified abnormalities of gait and mobility: Secondary | ICD-10-CM

## 2014-11-14 NOTE — Therapy (Signed)
[facility] [place] [address] [place], [place], [address] Phone: [phone]   Fax:  [phone]  Physical Therapy Treatment  Patient Details  Name: Oscar Castillo MRN: [id number] Date of Birth: Oct 23, 1950 Referring Provider:  [name], MD  Encounter Date: 11/14/2014      PT End of Session - 11/14/14 0930    Visit Number 65  [id number]   Number of Visits 34   Date for PT Re-Evaluation 01/02/15   PT Start Time 0930   PT Stop Time 9675   PT Time Calculation (min) 44 min   Equipment Utilized During Treatment Gait belt   Activity Tolerance Patient limited by fatigue;Patient limited by pain   Behavior During Therapy [facility] for tasks assessed/performed      Past Medical History  Diagnosis Date  . ESRD on hemodialysis 05/05/2007    ESRD due to DM/HTN. Started dialysis in November 2013.  HD TTS at [facility] on [address].  [name] BACK PAIN, LUMBAR, CHRONIC 08/06/2009  . BENIGN PROSTATIC HYPERTROPHY 08/01/2010  . CEREBROVASCULAR ACCIDENT, HX OF 08/06/2009  . CHOLELITHIASIS 08/01/2010  . CONGESTIVE HEART FAILURE 03/18/2009  . DEPRESSION 03/18/2009  . DIABETES MELLITUS, TYPE II 03/25/2007  . ERECTILE DYSFUNCTION 03/25/2007  . GERD 03/25/2007  . HEPATITIS C, HX OF 03/25/2007  . HYPERTENSION 03/25/2007  . Morbid obesity 03/25/2007  . NEPHROLITHIASIS, HX OF 03/18/2009  . Complication of anesthesia     wife states pt had trouble waking up with his last surgery in Nov., 2014    Past Surgical History  Procedure Laterality Date  . Nephrectomy      partial RR  . Av fistula placement  06/14/2012    Procedure: ARTERIOVENOUS (AV) FISTULA CREATION;  Surgeon: Angelia Mould, MD;  Location: [facility] OR;  Service: Vascular;  Laterality: Left;  Left basilic vein transposition with fistula.  . Tibia im nail insertion Left 09/09/2012    Procedure: INTRAMEDULLARY (IM) NAIL TIBIAL;  Surgeon: Johnny Bridge, MD;  Location: [place];  Service: Orthopedics;   Laterality: Left;  left tibial nail and open reduction internal fixation left fibula fracture  . Orif fibula fracture Left 09/09/2012    Procedure: OPEN REDUCTION INTERNAL FIXATION (ORIF) FIBULA FRACTURE;  Surgeon: Johnny Bridge, MD;  Location: [place];  Service: Orthopedics;  Laterality: Left;  . Colonoscopy N/A 10/28/2012    Procedure: COLONOSCOPY;  Surgeon: Jeryl Columbia, MD;  Location: [facility] ENDOSCOPY;  Service: Endoscopy;  Laterality: N/A;  . Esophagogastroduodenoscopy N/A 11/02/2012    Procedure: ESOPHAGOGASTRODUODENOSCOPY (EGD);  Surgeon: Cleotis Nipper, MD;  Location: [facility] ENDOSCOPY;  Service: Endoscopy;  Laterality: N/A;  . Colonoscopy N/A 11/02/2012    Procedure: COLONOSCOPY;  Surgeon: Cleotis Nipper, MD;  Location: [facility] ENDOSCOPY;  Service: Endoscopy;  Laterality: N/A;  . Colonoscopy N/A 11/03/2012    Procedure: COLONOSCOPY;  Surgeon: Cleotis Nipper, MD;  Location: [facility] ENDOSCOPY;  Service: Endoscopy;  Laterality: N/A;  . Givens capsule study N/A 11/04/2012    Procedure: GIVENS CAPSULE STUDY;  Surgeon: Cleotis Nipper, MD;  Location: [facility] ENDOSCOPY;  Service: Endoscopy;  Laterality: N/A;  . Enteroscopy N/A 11/08/2012    Procedure: ENTEROSCOPY;  Surgeon: Wonda Horner, MD;  Location: [facility] ENDOSCOPY;  Service: Endoscopy;  Laterality: N/A;  . Amputation Left 05/12/2013    Procedure: AMPUTATION RAY;  Surgeon: Newt Minion, MD;  Location: [place];  Service: Orthopedics;  Laterality: Left;  Left Foot 1st Ray Amputation  . Eye surgery  Left     to remove scar tissue  . Amputation Left 06/09/2013    Procedure: AMPUTATION BELOW KNEE;  Surgeon: Newt Minion, MD;  Location: [place];  Service: Orthopedics;  Laterality: Left;  Left Below Knee Amputation and removal proximal screws IM tibial nail  . Hardware removal Left 06/09/2013    Procedure: HARDWARE REMOVAL;  Surgeon: Newt Minion, MD;  Location: [place];  Service: Orthopedics;  Laterality: Left;  Left Below Knee Amputation  and Removal proximal screws IM tibial  nail  . Amputation Right 09/08/2013    Procedure: AMPUTATION BELOW KNEE;  Surgeon: Newt Minion, MD;  Location: [place];  Service: Orthopedics;  Laterality: Right;  Right Below Knee Amputation  . Amputation Right 10/11/2013    Procedure: AMPUTATION BELOW KNEE;  Surgeon: Newt Minion, MD;  Location: [place];  Service: Orthopedics;  Laterality: Right;  Right Below Knee Amputation Revision    There were no vitals filed for this visit.  Visit Diagnosis:  Generalized weakness  Decreased range of knee movement, unspecified laterality  Decreased functional activity tolerance  Abnormality of gait  Balance problems  Status post bilateral below knee amputation      Subjective Assessment - 11/14/14 0930    Subjective wearing prostheses ~4 hrs 1-2 x/day   Currently in Pain? Yes   Pain Score 6    Pain Location Arm   Pain Orientation Right;Left     Prosthetic Training: PT redonned prostheses as arrived with liner slipped down. Patient & wife report understanding of proper donning. At sink with PT instructing wife how to assist. 6 reps- Sit to stand with minA with verbal cues and stand to sit with minA. Maintain upright 5 seconds 3 reps and 30 seconds 2 reps with min guard. Wife assisted 5th sit to/from stand correctly with PT supervising. Patient sit to/from stand w/c to RW with maxA. Patient ambulated 5' with RW with maxA 2 people to facilitate knee ext in stance, upright posture / trunk. W/c close behind for safety. Balance / modified stand (sitting on 24" stool in parallel bars for safety) without UE assist - trunk AROM - forward lean with recovery, back lean with recovery, trunk rotation Squat pivot transfer with min guard.  Therapeutic Exercise: Sitting on 24" stool: red theraband reciprocal & BUE 10 reps each row, forward reach, and upward reach NuStep with 4 extremities Level 3 for 5 minutes with cues.                            PT Education - 11/14/14 0930     Education provided Yes   Education Details standing up to sink with wife   Person(s) Educated Patient;Spouse   Methods Explanation;Demonstration;Verbal cues   Comprehension Verbalized understanding;Returned demonstration;Tactile cues required;Need further instruction          PT Short Term Goals - 10/31/14 1145    PT SHORT TERM GOAL #1   Title donnes prostheses with minimal assist (Target Date: 10/02/14)   Baseline MET 10/31/14   Time 1   Period Months   Status Achieved   PT SHORT TERM GOAL #2   Title wife & patient verbalize adjusting wear schedule to maximize functional potential. (Target Date: 10/02/14)   Time 1   Period Months   Status Achieved   PT SHORT TERM GOAL #3   Title performs squat-pivot transfer w/c to level mat with minimal assist. (Target Date: 10/02/14)   Baseline  MET 09/26/14   Time 1   Period Months   Status Achieved   PT SHORT TERM GOAL #4   Title Sit to/from stand w/c to parallel bars with moderate assist (Target Date: 10/02/14)   Baseline MET 09/26/14   Time 1   Period Months   Status Achieved   PT SHORT TERM GOAL #5   Title patient & wife demonstrate initial HEP correctly. (Target Date: 10/02/14)   Baseline MET 09/21/14   Time 1   Period Months   Status Achieved   Additional Short Term Goals   Additional Short Term Goals Yes   PT SHORT TERM GOAL #6   Title reports wear of prostheses daily >80% of awake hours. (Target Date: 11/02/14)   Baseline Partially MET Patient wears prostheses >80% of out of bed time which is ~60% of awake hours.   Time 30   Period Days   Status Partially Met   PT SHORT TERM GOAL #7   Title Squat-pivot transfer with supervision. (Target Date: 11/02/14)   Baseline Partially MET 10/31/14 Squat-pivot transfer with contact assist for safety without physical assist.   Time 30   Period Days   Status Partially Met   PT SHORT TERM GOAL #8   Title Sit to / from stand w/c to RW with moderate assist. (Target Date: 11/02/14)   Baseline MET 10/31/14    Time 30   Period Days   Status New   PT SHORT TERM GOAL #9   TITLE stand-pivot transfer including sit to/from stand with rolling walker and prostheses with modA. (Target Date:11/30/2014)   Time 4   Period Weeks   Status New   PT SHORT TERM GOAL #10   TITLE ambulates 20' with rolling walker and prostheses with moderate assist. (Target Date:11/30/2014)   Time 4   Period Weeks   Status New   PT SHORT TERM GOAL #11   TITLE reports wear >80% of out of bed awake hours except dialysis without skin issues or pain. (Target Date:11/30/2014)   Time 4   Period Weeks   Status New           PT Long Term Goals - 10/31/14 1145    PT LONG TERM GOAL #1   Title Patient and wife demonstrate proper prosthetic care. (Target Date: 01/02/15)   Time 4   Period Months   Status New   PT LONG TERM GOAL #2   Title tolerates wear of prostheses >90% of awake hours except at dialysis (Target Date: 01/02/15)   Time 4   Period Months   Status On-going   PT LONG TERM GOAL #3   Title stand pivot transfers with rolling walker & prostheses with supervision.  (Target Date: 01/02/15)   Time 4   Period Months   Status Revised   PT LONG TERM GOAL #4   Title manages clothes, reaches 5" and in upper /lower cabinets in standing with UE support with minimal assist.  (Target Date: 01/02/15)   Time 4   Period Months   Status Revised   PT LONG TERM GOAL #5   Title ambulate 13' with rolling walker & prostheses with minimal assist.  (Target Date: 01/02/15)   Time 4   Period Months   Status Revised               Plan - 11/14/14 0930    Clinical Impression Statement Patient & wife appear to be able to safely stand up and sit down at sink. PT  recommended not standing >5 sec curretly to allow them to master just the sit to/from stand first without concern over knees fatigueing.   Pt will benefit from skilled therapeutic intervention in order to improve on the following deficits Abnormal gait;Decreased activity  tolerance;Decreased balance;Decreased endurance;Decreased knowledge of use of DME;Decreased mobility;Decreased range of motion;Decreased strength;Difficulty walking;Other (comment)  prosthetic dependency   Rehab Potential Good   Clinical Impairments Affecting Rehab Potential multiple medical issues over last year causing decondtioning   PT Frequency 2x / week   PT Duration Other (comment)  4 months   PT Treatment/Interventions ADLs/Self Care Home Management;Gait training;Stair training;DME Instruction;Functional mobility training;Therapeutic activities;Therapeutic exercise;Balance training;Neuromuscular re-education;Patient/family education;Manual techniques;Other (comment)  prosthetic training   PT Next Visit Plan beging with sink standing for working towards this at home with spouse and then standing frame, transfers, standing with rolling walker, strength & endurance activities   Consulted and Agree with Plan of Care Patient;Family member/caregiver   Family Member Consulted wife          G-Codes - 2014/12/03 1258    Functional Assessment Tool Used donnes prostheses with supervision with increased time, wears prostheses 4hrs 1-2 x/day depending on dialysis, cues on proper donning   Functional Limitation Self care   Self Care Current Status (F6433) At least 40 percent but less than 60 percent impaired, limited or restricted   Self Care Goal Status (I9518) At least 20 percent but less than 40 percent impaired, limited or restricted      Problem List Patient Active Problem List   Diagnosis Date Noted  . Diarrhea 08/14/2014  . Dehydration 10/02/2013  . Fever 10/02/2013  . Altered mental status 10/01/2013  . Encephalopathy, toxic 10/01/2013  . Encephalopathy, metabolic 84/16/6063  . CVA (cerebral vascular accident) 09/28/2013  . FTT (failure to thrive) in adult 09/28/2013  . Acute confusional state 09/28/2013  . Ulcer of sacral region, stage 3 09/26/2013  . S/P BKA (below knee  amputation) bilateral 09/08/2013  . ESRD on hemodialysis 05/09/2013  . TIA (transient ischemic attack) 02/20/2013  . Acute blood loss anemia 10/28/2012  . Chronic combined systolic (EF 01%) and grade 2diastolic congestive heart failure 10/28/2012  . Obstipation 10/28/2012  . GI bleed 10/27/2012  . Mass in rectum 10/27/2012  . DM (diabetes mellitus) type I controlled with renal manifestation 09/08/2012  . LVH (left ventricular hypertrophy)-severe concentric 06/13/2012  . Anemia due to chronic illness 06/12/2012  . Hyperlipidemia 01/30/2011  . CHOLELITHIASIS 08/01/2010  . BENIGN PROSTATIC HYPERTROPHY 08/01/2010  . CEREBROVASCULAR ACCIDENT, HX OF 08/06/2009  . Depression 03/18/2009  . Acute combined systolic and diastolic heart failure, NYHA class 2-EF 45% 03/18/2009  . NEPHROLITHIASIS, HX OF 03/18/2009  . Morbid obesity 03/25/2007  . Essential hypertension 03/25/2007  . GERD 03/25/2007  . HEPATITIS C, HX OF 03/25/2007    Jamey Reas PT, DPT 03-Dec-2014, 1:00 PM  [place] [address] [place], [place], [address] Phone: [phone]   Fax:  [phone]

## 2014-11-15 DIAGNOSIS — N186 End stage renal disease: Secondary | ICD-10-CM | POA: Diagnosis not present

## 2014-11-15 DIAGNOSIS — E1122 Type 2 diabetes mellitus with diabetic chronic kidney disease: Secondary | ICD-10-CM | POA: Diagnosis not present

## 2014-11-15 DIAGNOSIS — N2581 Secondary hyperparathyroidism of renal origin: Secondary | ICD-10-CM | POA: Diagnosis not present

## 2014-11-16 ENCOUNTER — Encounter: Payer: Self-pay | Admitting: Physical Therapy

## 2014-11-16 ENCOUNTER — Ambulatory Visit: Payer: Medicare Other | Admitting: Physical Therapy

## 2014-11-16 DIAGNOSIS — R4189 Other symptoms and signs involving cognitive functions and awareness: Secondary | ICD-10-CM | POA: Diagnosis not present

## 2014-11-16 DIAGNOSIS — R5381 Other malaise: Secondary | ICD-10-CM

## 2014-11-16 DIAGNOSIS — R6889 Other general symptoms and signs: Secondary | ICD-10-CM

## 2014-11-16 DIAGNOSIS — Z89511 Acquired absence of right leg below knee: Secondary | ICD-10-CM | POA: Diagnosis not present

## 2014-11-16 DIAGNOSIS — Z89512 Acquired absence of left leg below knee: Secondary | ICD-10-CM | POA: Diagnosis not present

## 2014-11-16 DIAGNOSIS — R531 Weakness: Secondary | ICD-10-CM

## 2014-11-16 DIAGNOSIS — Z7409 Other reduced mobility: Secondary | ICD-10-CM | POA: Diagnosis not present

## 2014-11-16 DIAGNOSIS — R2689 Other abnormalities of gait and mobility: Secondary | ICD-10-CM

## 2014-11-16 DIAGNOSIS — M24669 Ankylosis, unspecified knee: Secondary | ICD-10-CM

## 2014-11-17 DIAGNOSIS — N2581 Secondary hyperparathyroidism of renal origin: Secondary | ICD-10-CM | POA: Diagnosis not present

## 2014-11-17 DIAGNOSIS — E1122 Type 2 diabetes mellitus with diabetic chronic kidney disease: Secondary | ICD-10-CM | POA: Diagnosis not present

## 2014-11-17 DIAGNOSIS — N186 End stage renal disease: Secondary | ICD-10-CM | POA: Diagnosis not present

## 2014-11-18 NOTE — Therapy (Signed)
[facility] [address] [place], [place], [address] Phone: [phone]   Fax:  [phone]  Physical Therapy Treatment  Patient Details  Name: Oscar Castillo MRN: [id number] Date of Birth: 20-Aug-1950 Referring Provider:  [name], MD  Encounter Date: 11/16/2014   11/16/14 1320  PT Visits / Re-Eval  Visit Number 21 ([id number])  Number of Visits 34  Date for PT Re-Evaluation 01/02/15  PT Time Calculation  PT Start Time 1315  PT Stop Time 1350  PT Time Calculation (min) 35 min  PT - End of Session  Equipment Utilized During Treatment Gait belt  Activity Tolerance Patient limited by fatigue;Patient limited by pain  Behavior During Therapy [facility] for tasks assessed/performed      Past Medical History  Diagnosis Date  . ESRD on hemodialysis 05/05/2007    ESRD due to DM/HTN. Started dialysis in November 2013.  HD TTS at [facility] [address].  [name] BACK PAIN, LUMBAR, CHRONIC 08/06/2009  . BENIGN PROSTATIC HYPERTROPHY 08/01/2010  . CEREBROVASCULAR ACCIDENT, HX OF 08/06/2009  . CHOLELITHIASIS 08/01/2010  . CONGESTIVE HEART FAILURE 03/18/2009  . DEPRESSION 03/18/2009  . DIABETES MELLITUS, TYPE II 03/25/2007  . ERECTILE DYSFUNCTION 03/25/2007  . GERD 03/25/2007  . HEPATITIS C, HX OF 03/25/2007  . HYPERTENSION 03/25/2007  . Morbid obesity 03/25/2007  . NEPHROLITHIASIS, HX OF 03/18/2009  . Complication of anesthesia     wife states pt had trouble waking up with his last surgery in Nov., 2014    Past Surgical History  Procedure Laterality Date  . Nephrectomy      partial RR  . Av fistula placement  06/14/2012    Procedure: ARTERIOVENOUS (AV) FISTULA CREATION;  Surgeon: Angelia Mould, MD;  Location: [facility] OR;  Service: Vascular;  Laterality: Left;  Left basilic vein transposition with fistula.  . Tibia im nail insertion Left 09/09/2012    Procedure: INTRAMEDULLARY (IM) NAIL TIBIAL;  Surgeon: Johnny Bridge, MD;  Location: [place];  Service: Orthopedics;  Laterality: Left;  left tibial nail and open reduction internal fixation left fibula fracture  . Orif fibula fracture Left 09/09/2012    Procedure: OPEN REDUCTION INTERNAL FIXATION (ORIF) FIBULA FRACTURE;  Surgeon: Johnny Bridge, MD;  Location: [place];  Service: Orthopedics;  Laterality: Left;  . Colonoscopy N/A 10/28/2012    Procedure: COLONOSCOPY;  Surgeon: Jeryl Columbia, MD;  Location: [facility] ENDOSCOPY;  Service: Endoscopy;  Laterality: N/A;  . Esophagogastroduodenoscopy N/A 11/02/2012    Procedure: ESOPHAGOGASTRODUODENOSCOPY (EGD);  Surgeon: Cleotis Nipper, MD;  Location: [facility] ENDOSCOPY;  Service: Endoscopy;  Laterality: N/A;  . Colonoscopy N/A 11/02/2012    Procedure: COLONOSCOPY;  Surgeon: Cleotis Nipper, MD;  Location: [facility] ENDOSCOPY;  Service: Endoscopy;  Laterality: N/A;  . Colonoscopy N/A 11/03/2012    Procedure: COLONOSCOPY;  Surgeon: Cleotis Nipper, MD;  Location: [facility] ENDOSCOPY;  Service: Endoscopy;  Laterality: N/A;  . Givens capsule study N/A 11/04/2012    Procedure: GIVENS CAPSULE STUDY;  Surgeon: Cleotis Nipper, MD;  Location: [facility] ENDOSCOPY;  Service: Endoscopy;  Laterality: N/A;  . Enteroscopy N/A 11/08/2012    Procedure: ENTEROSCOPY;  Surgeon: Wonda Horner, MD;  Location: [facility] ENDOSCOPY;  Service: Endoscopy;  Laterality: N/A;  . Amputation Left 05/12/2013    Procedure: AMPUTATION RAY;  Surgeon: Newt Minion, MD;  Location: [place];  Service: Orthopedics;  Laterality: Left;  Left Foot 1st Ray Amputation  . Eye surgery Left  to remove scar tissue  . Amputation Left 06/09/2013    Procedure: AMPUTATION BELOW KNEE;  Surgeon: Newt Minion, MD;  Location: [place];  Service: Orthopedics;  Laterality: Left;  Left Below Knee Amputation and removal proximal screws IM tibial nail  . Hardware removal Left 06/09/2013    Procedure: HARDWARE REMOVAL;  Surgeon: Newt Minion, MD;  Location: [place];  Service: Orthopedics;  Laterality: Left;  Left Below Knee Amputation  and  Removal proximal screws IM tibial nail  . Amputation Right 09/08/2013    Procedure: AMPUTATION BELOW KNEE;  Surgeon: Newt Minion, MD;  Location: [place];  Service: Orthopedics;  Laterality: Right;  Right Below Knee Amputation  . Amputation Right 10/11/2013    Procedure: AMPUTATION BELOW KNEE;  Surgeon: Newt Minion, MD;  Location: [place];  Service: Orthopedics;  Laterality: Right;  Right Below Knee Amputation Revision    There were no vitals filed for this visit.  Visit Diagnosis:  Generalized weakness  Balance problems  Decreased range of knee movement, unspecified laterality  Decreased functional activity tolerance  Debility      11/16/14 1341  Transfers  Sit to Stand 2: Max assist  Sit to Stand Details (indicate cue type and reason) wheelchair to/from sink x 2 reps with cues on correct technique and sequence                     Stand to Sit 3: Mod assist  Stand to Sit Details cues to reach back and use arms to control descent                        Static Standing Balance  Static Standing - Balance Support Right upper extremity supported;Left upper extremity supported;Bilateral upper extremity supported;During functional activity  Static Standing - Level of Assistance Other (comment) (standing frame)  Static Standing - Comment/# of Minutes x 3 reps. worked on [employer], hip/knee extension with standing, alternating UE reaching, upper trunk roation and cross body reaching with stands.                                PT Short Term Goals - 10/31/14 1145    PT SHORT TERM GOAL #1   Title donnes prostheses with minimal assist (Target Date: 10/02/14)   Baseline MET 10/31/14   Time 1   Period Months   Status Achieved   PT SHORT TERM GOAL #2   Title wife & patient verbalize adjusting wear schedule to maximize functional potential. (Target Date: 10/02/14)   Time 1   Period Months   Status Achieved   PT SHORT TERM GOAL #3   Title performs squat-pivot transfer w/c to level mat with  minimal assist. (Target Date: 10/02/14)   Baseline MET 09/26/14   Time 1   Period Months   Status Achieved   PT SHORT TERM GOAL #4   Title Sit to/from stand w/c to parallel bars with moderate assist (Target Date: 10/02/14)   Baseline MET 09/26/14   Time 1   Period Months   Status Achieved   PT SHORT TERM GOAL #5   Title patient & wife demonstrate initial HEP correctly. (Target Date: 10/02/14)   Baseline MET 09/21/14   Time 1   Period Months   Status Achieved   Additional Short Term Goals   Additional Short Term Goals Yes   PT SHORT TERM GOAL #  6   Title reports wear of prostheses daily >80% of awake hours. (Target Date: 11/02/14)   Baseline Partially MET Patient wears prostheses >80% of out of bed time which is ~60% of awake hours.   Time 30   Period Days   Status Partially Met   PT SHORT TERM GOAL #7   Title Squat-pivot transfer with supervision. (Target Date: 11/02/14)   Baseline Partially MET 10/31/14 Squat-pivot transfer with contact assist for safety without physical assist.   Time 30   Period Days   Status Partially Met   PT SHORT TERM GOAL #8   Title Sit to / from stand w/c to RW with moderate assist. (Target Date: 11/02/14)   Baseline MET 10/31/14   Time 30   Period Days   Status New   PT SHORT TERM GOAL #9   TITLE stand-pivot transfer including sit to/from stand with rolling walker and prostheses with modA. (Target Date:11/30/2014)   Time 4   Period Weeks   Status New   PT SHORT TERM GOAL #10   TITLE ambulates 20' with rolling walker and prostheses with moderate assist. (Target Date:11/30/2014)   Time 4   Period Weeks   Status New   PT SHORT TERM GOAL #11   TITLE reports wear >80% of out of bed awake hours except dialysis without skin issues or pain. (Target Date:11/30/2014)   Time 4   Period Weeks   Status New           PT Long Term Goals - 10/31/14 1145    PT LONG TERM GOAL #1   Title Patient and wife demonstrate proper prosthetic care. (Target Date: 01/02/15)   Time 4    Period Months   Status New   PT LONG TERM GOAL #2   Title tolerates wear of prostheses >90% of awake hours except at dialysis (Target Date: 01/02/15)   Time 4   Period Months   Status On-going   PT LONG TERM GOAL #3   Title stand pivot transfers with rolling walker & prostheses with supervision.  (Target Date: 01/02/15)   Time 4   Period Months   Status Revised   PT LONG TERM GOAL #4   Title manages clothes, reaches 5" and in upper /lower cabinets in standing with UE support with minimal assist.  (Target Date: 01/02/15)   Time 4   Period Months   Status Revised   PT LONG TERM GOAL #5   Title ambulate 30' with rolling walker & prostheses with minimal assist.  (Target Date: 01/02/15)   Time 4   Period Months   Status Revised        11/16/14 1320  Plan  Clinical Impression Statement Pt limited by fatigue and hand pain today. Increased reps with standing frame, however pt requesting to end session early due to fatigue and pain after 3 rep in standing frame. Slowly progressing toward goals.  Pt will benefit from skilled therapeutic intervention in order to improve on the following deficits Abnormal gait;Decreased activity tolerance;Decreased balance;Decreased endurance;Decreased knowledge of use of DME;Decreased mobility;Decreased range of motion;Decreased strength;Difficulty walking;Other (comment) (prosthetic dependency)  Rehab Potential Good  Clinical Impairments Affecting Rehab Potential multiple medical issues over last year causing decondtioning  PT Frequency 2x / week  PT Duration Other (comment) (4 months)  PT Treatment/Interventions ADLs/Self Care Home Management;Gait training;Stair training;DME Instruction;Functional mobility training;Therapeutic activities;Therapeutic exercise;Balance training;Neuromuscular re-education;Patient/family education;Manual techniques;Other (comment) (prosthetic training)  PT Next Visit Plan beging with sink standing for working towards this  at home with  spouse and then standing frame, transfers, standing with rolling walker, strength & endurance activities  Consulted and Agree with Plan of Care Patient;Family member/caregiver  Family Member Consulted wife     Problem List Patient Active Problem List   Diagnosis Date Noted  . Diarrhea 08/14/2014  . Dehydration 10/02/2013  . Fever 10/02/2013  . Altered mental status 10/01/2013  . Encephalopathy, toxic 10/01/2013  . Encephalopathy, metabolic 52/71/2929  . CVA (cerebral vascular accident) 09/28/2013  . FTT (failure to thrive) in adult 09/28/2013  . Acute confusional state 09/28/2013  . Ulcer of sacral region, stage 3 09/26/2013  . S/P BKA (below knee amputation) bilateral 09/08/2013  . ESRD on hemodialysis 05/09/2013  . TIA (transient ischemic attack) 02/20/2013  . Acute blood loss anemia 10/28/2012  . Chronic combined systolic (EF 09%) and grade 2diastolic congestive heart failure 10/28/2012  . Obstipation 10/28/2012  . GI bleed 10/27/2012  . Mass in rectum 10/27/2012  . DM (diabetes mellitus) type I controlled with renal manifestation 09/08/2012  . LVH (left ventricular hypertrophy)-severe concentric 06/13/2012  . Anemia due to chronic illness 06/12/2012  . Hyperlipidemia 01/30/2011  . CHOLELITHIASIS 08/01/2010  . BENIGN PROSTATIC HYPERTROPHY 08/01/2010  . CEREBROVASCULAR ACCIDENT, HX OF 08/06/2009  . Depression 03/18/2009  . Acute combined systolic and diastolic heart failure, NYHA class 2-EF 45% 03/18/2009  . NEPHROLITHIASIS, HX OF 03/18/2009  . Morbid obesity 03/25/2007  . Essential hypertension 03/25/2007  . GERD 03/25/2007  . HEPATITIS C, HX OF 03/25/2007    Willow Ora 11/18/2014, 9:41 PM  Willow Ora, PTA, [facility] [address], [place], [place] [address] [phone] 11/18/2014, 9:41 PM

## 2014-11-20 DIAGNOSIS — E1122 Type 2 diabetes mellitus with diabetic chronic kidney disease: Secondary | ICD-10-CM | POA: Diagnosis not present

## 2014-11-20 DIAGNOSIS — N186 End stage renal disease: Secondary | ICD-10-CM | POA: Diagnosis not present

## 2014-11-20 DIAGNOSIS — N2581 Secondary hyperparathyroidism of renal origin: Secondary | ICD-10-CM | POA: Diagnosis not present

## 2014-11-21 ENCOUNTER — Ambulatory Visit: Payer: Medicare Other | Admitting: Physical Therapy

## 2014-11-21 ENCOUNTER — Encounter: Payer: Self-pay | Admitting: Physical Therapy

## 2014-11-21 DIAGNOSIS — R4189 Other symptoms and signs involving cognitive functions and awareness: Secondary | ICD-10-CM | POA: Diagnosis not present

## 2014-11-21 DIAGNOSIS — M24669 Ankylosis, unspecified knee: Secondary | ICD-10-CM

## 2014-11-21 DIAGNOSIS — R531 Weakness: Secondary | ICD-10-CM | POA: Diagnosis not present

## 2014-11-21 DIAGNOSIS — Z89511 Acquired absence of right leg below knee: Secondary | ICD-10-CM | POA: Diagnosis not present

## 2014-11-21 DIAGNOSIS — Z7409 Other reduced mobility: Secondary | ICD-10-CM | POA: Diagnosis not present

## 2014-11-21 DIAGNOSIS — Z89512 Acquired absence of left leg below knee: Secondary | ICD-10-CM | POA: Diagnosis not present

## 2014-11-21 DIAGNOSIS — R5381 Other malaise: Secondary | ICD-10-CM | POA: Diagnosis not present

## 2014-11-21 DIAGNOSIS — R269 Unspecified abnormalities of gait and mobility: Secondary | ICD-10-CM

## 2014-11-21 DIAGNOSIS — R6889 Other general symptoms and signs: Secondary | ICD-10-CM

## 2014-11-21 NOTE — Therapy (Signed)
[facility] [address] [place], [place], [address] Phone: [phone]   Fax:  [phone]  Physical Therapy Treatment  Patient Details  Name: Oscar Castillo MRN: [id number] Date of Birth: 07-17-1951 Referring Provider:  [name], MD  Encounter Date: 11/21/2014      PT End of Session - 11/21/14 0806    Visit Number 89  [id number]   Number of Visits 34   Date for PT Re-Evaluation 01/02/15   PT Start Time 0802   PT Stop Time 0843   PT Time Calculation (min) 41 min   Equipment Utilized During Treatment Gait belt   Activity Tolerance Patient limited by fatigue;Patient limited by pain   Behavior During Therapy [facility] for tasks assessed/performed      Past Medical History  Diagnosis Date  . ESRD on hemodialysis 05/05/2007    ESRD due to DM/HTN. Started dialysis in November 2013.  HD TTS at [facility] on [place].  [name] BACK PAIN, LUMBAR, CHRONIC 08/06/2009  . BENIGN PROSTATIC HYPERTROPHY 08/01/2010  . CEREBROVASCULAR ACCIDENT, HX OF 08/06/2009  . CHOLELITHIASIS 08/01/2010  . CONGESTIVE HEART FAILURE 03/18/2009  . DEPRESSION 03/18/2009  . DIABETES MELLITUS, TYPE II 03/25/2007  . ERECTILE DYSFUNCTION 03/25/2007  . GERD 03/25/2007  . HEPATITIS C, HX OF 03/25/2007  . HYPERTENSION 03/25/2007  . Morbid obesity 03/25/2007  . NEPHROLITHIASIS, HX OF 03/18/2009  . Complication of anesthesia     wife states pt had trouble waking up with his last surgery in Nov., 2014    Past Surgical History  Procedure Laterality Date  . Nephrectomy      partial RR  . Av fistula placement  06/14/2012    Procedure: ARTERIOVENOUS (AV) FISTULA CREATION;  Surgeon: Angelia Mould, MD;  Location: [facility] OR;  Service: Vascular;  Laterality: Left;  Left basilic vein transposition with fistula.  . Tibia im nail insertion Left 09/09/2012    Procedure: INTRAMEDULLARY (IM) NAIL TIBIAL;  Surgeon: Johnny Bridge, MD;  Location: [place];  Service: Orthopedics;   Laterality: Left;  left tibial nail and open reduction internal fixation left fibula fracture  . Orif fibula fracture Left 09/09/2012    Procedure: OPEN REDUCTION INTERNAL FIXATION (ORIF) FIBULA FRACTURE;  Surgeon: Johnny Bridge, MD;  Location: [place];  Service: Orthopedics;  Laterality: Left;  . Colonoscopy N/A 10/28/2012    Procedure: COLONOSCOPY;  Surgeon: Jeryl Columbia, MD;  Location: [facility] ENDOSCOPY;  Service: Endoscopy;  Laterality: N/A;  . Esophagogastroduodenoscopy N/A 11/02/2012    Procedure: ESOPHAGOGASTRODUODENOSCOPY (EGD);  Surgeon: Cleotis Nipper, MD;  Location: [facility] ENDOSCOPY;  Service: Endoscopy;  Laterality: N/A;  . Colonoscopy N/A 11/02/2012    Procedure: COLONOSCOPY;  Surgeon: Cleotis Nipper, MD;  Location: [facility] ENDOSCOPY;  Service: Endoscopy;  Laterality: N/A;  . Colonoscopy N/A 11/03/2012    Procedure: COLONOSCOPY;  Surgeon: Cleotis Nipper, MD;  Location: [facility] ENDOSCOPY;  Service: Endoscopy;  Laterality: N/A;  . Givens capsule study N/A 11/04/2012    Procedure: GIVENS CAPSULE STUDY;  Surgeon: Cleotis Nipper, MD;  Location: [facility] ENDOSCOPY;  Service: Endoscopy;  Laterality: N/A;  . Enteroscopy N/A 11/08/2012    Procedure: ENTEROSCOPY;  Surgeon: Wonda Horner, MD;  Location: [facility] ENDOSCOPY;  Service: Endoscopy;  Laterality: N/A;  . Amputation Left 05/12/2013    Procedure: AMPUTATION RAY;  Surgeon: Newt Minion, MD;  Location: [place];  Service: Orthopedics;  Laterality: Left;  Left Foot 1st Ray Amputation  . Eye surgery  Left     to remove scar tissue  . Amputation Left 06/09/2013    Procedure: AMPUTATION BELOW KNEE;  Surgeon: Newt Minion, MD;  Location: [place];  Service: Orthopedics;  Laterality: Left;  Left Below Knee Amputation and removal proximal screws IM tibial nail  . Hardware removal Left 06/09/2013    Procedure: HARDWARE REMOVAL;  Surgeon: Newt Minion, MD;  Location: [place];  Service: Orthopedics;  Laterality: Left;  Left Below Knee Amputation  and Removal proximal screws IM tibial  nail  . Amputation Right 09/08/2013    Procedure: AMPUTATION BELOW KNEE;  Surgeon: Newt Minion, MD;  Location: [place];  Service: Orthopedics;  Laterality: Right;  Right Below Knee Amputation  . Amputation Right 10/11/2013    Procedure: AMPUTATION BELOW KNEE;  Surgeon: Newt Minion, MD;  Location: [place];  Service: Orthopedics;  Laterality: Right;  Right Below Knee Amputation Revision    There were no vitals filed for this visit.  Visit Diagnosis:  Generalized weakness  Decreased range of knee movement, unspecified laterality  Decreased functional activity tolerance  Abnormality of gait  Debility      Subjective Assessment - 11/21/14 0805    Subjective No new complaints. No falls. Some pain still in his arms and hands.   Currently in Pain? Yes   Pain Score 6    Pain Location Arm  both arms and hands   Pain Orientation Right;Left   Pain Descriptors / Indicators Aching;Sore   Pain Type Chronic pain   Pain Onset More than a month ago   Pain Frequency Constant   Aggravating Factors  using hands/movement   Pain Relieving Factors rest, medicine          [facility] Adult PT Treatment/Exercise - 11/21/14 0807    Transfers   Sit to Stand 3: Mod assist;With upper extremity assist;From chair/3-in-1   Sit to Stand Details (indicate cue type and reason) wheelchair to/from sink x 3 reps with cues on technique/sequence. working on standing posture each tim with max standing time of 12 seconds on 3rd rep.                   Stand to Sit 3: Mod assist;With upper extremity assist;To chair/3-in-1   Stand to Sit Details cues to reach back and use arms to control descent with sitting down.   Ambulation/Gait   Ambulation/Gait Yes   Ambulation/Gait Assistance 3: Mod assist;4: Min assist   Ambulation/Gait Assistance Details multimodal cues on posture, step length, hand advancement and bil knee blocked to promote stance knee extension and prevent buckling                        Ambulation Distance  (Feet) 10 Feet  x2 in parallel bars   Assistive device Prostheses;Parallel bars   Gait Pattern Step-through pattern;Decreased stride length;Trunk flexed;Decreased step length - right;Decreased step length - left;Right flexed knee in stance;Left flexed knee in stance;Decreased hip/knee flexion - right;Decreased hip/knee flexion - left   Ambulation Surface Level;Indoor   Static Standing Balance   Static Standing - Balance Support Bilateral upper extremity supported;Left upper extremity supported;Right upper extremity supported;During functional activity   Static Standing - Level of Assistance Other (comment)  standing frame   Static Standing - Comment/# of Minutes x  reps, working on tall posture and bil knee extension. performed alternating UE raises, upper trunk rotation,  Prosthetics   Current prosthetic wear tolerance (days/week)  7 days   Current prosthetic wear tolerance (#hours/day)  most awake hours, ~80%   Residual limb condition  no issues per pt.   Education Provided Residual limb care;Proper wear schedule/adjustment   Person(s) Educated Patient;Spouse   Education Method Explanation   Education Method Verbalized understanding;Needs further instruction   Donning Prosthesis Minimal assist   Doffing Prosthesis Minimal assist           PT Short Term Goals - 10/31/14 1145    PT SHORT TERM GOAL #1   Title donnes prostheses with minimal assist (Target Date: 10/02/14)   Baseline MET 10/31/14   Time 1   Period Months   Status Achieved   PT SHORT TERM GOAL #2   Title wife & patient verbalize adjusting wear schedule to maximize functional potential. (Target Date: 10/02/14)   Time 1   Period Months   Status Achieved   PT SHORT TERM GOAL #3   Title performs squat-pivot transfer w/c to level mat with minimal assist. (Target Date: 10/02/14)   Baseline MET 09/26/14   Time 1   Period Months   Status Achieved   PT SHORT TERM GOAL #4   Title Sit to/from  stand w/c to parallel bars with moderate assist (Target Date: 10/02/14)   Baseline MET 09/26/14   Time 1   Period Months   Status Achieved   PT SHORT TERM GOAL #5   Title patient & wife demonstrate initial HEP correctly. (Target Date: 10/02/14)   Baseline MET 09/21/14   Time 1   Period Months   Status Achieved   Additional Short Term Goals   Additional Short Term Goals Yes   PT SHORT TERM GOAL #6   Title reports wear of prostheses daily >80% of awake hours. (Target Date: 11/02/14)   Baseline Partially MET Patient wears prostheses >80% of out of bed time which is ~60% of awake hours.   Time 30   Period Days   Status Partially Met   PT SHORT TERM GOAL #7   Title Squat-pivot transfer with supervision. (Target Date: 11/02/14)   Baseline Partially MET 10/31/14 Squat-pivot transfer with contact assist for safety without physical assist.   Time 30   Period Days   Status Partially Met   PT SHORT TERM GOAL #8   Title Sit to / from stand w/c to RW with moderate assist. (Target Date: 11/02/14)   Baseline MET 10/31/14   Time 30   Period Days   Status New   PT SHORT TERM GOAL #9   TITLE stand-pivot transfer including sit to/from stand with rolling walker and prostheses with modA. (Target Date:11/30/2014)   Time 4   Period Weeks   Status New   PT SHORT TERM GOAL #10   TITLE ambulates 20' with rolling walker and prostheses with moderate assist. (Target Date:11/30/2014)   Time 4   Period Weeks   Status New   PT SHORT TERM GOAL #11   TITLE reports wear >80% of out of bed awake hours except dialysis without skin issues or pain. (Target Date:11/30/2014)   Time 4   Period Weeks   Status New           PT Long Term Goals - 10/31/14 1145    PT LONG TERM GOAL #1   Title Patient and wife demonstrate proper prosthetic care. (Target Date: 01/02/15)   Time 4   Period Months   Status New   PT LONG TERM  GOAL #2   Title tolerates wear of prostheses >90% of awake hours except at dialysis (Target Date: 01/02/15)    Time 4   Period Months   Status On-going   PT LONG TERM GOAL #3   Title stand pivot transfers with rolling walker & prostheses with supervision.  (Target Date: 01/02/15)   Time 4   Period Months   Status Revised   PT LONG TERM GOAL #4   Title manages clothes, reaches 5" and in upper /lower cabinets in standing with UE support with minimal assist.  (Target Date: 01/02/15)   Time 4   Period Months   Status Revised   PT LONG TERM GOAL #5   Title ambulate 61' with rolling walker & prostheses with minimal assist.  (Target Date: 01/02/15)   Time 4   Period Months   Status Revised           Plan - 11/21/14 0807    Clinical Impression Statement Pt with increased activity tolerance today and more paticipatory with therapy. Progressing well towards goals.   Pt will benefit from skilled therapeutic intervention in order to improve on the following deficits Abnormal gait;Decreased activity tolerance;Decreased balance;Decreased endurance;Decreased knowledge of use of DME;Decreased mobility;Decreased range of motion;Decreased strength;Difficulty walking;Other (comment)  prosthetic dependency   Rehab Potential Good   Clinical Impairments Affecting Rehab Potential multiple medical issues over last year causing decondtioning   PT Frequency 2x / week   PT Duration Other (comment)  4 months   PT Treatment/Interventions ADLs/Self Care Home Management;Gait training;Stair training;DME Instruction;Functional mobility training;Therapeutic activities;Therapeutic exercise;Balance training;Neuromuscular re-education;Patient/family education;Manual techniques;Other (comment)  prosthetic training   PT Next Visit Plan beging with sink standing for working towards this at home with spouse and then standing frame, transfers, standing with rolling walker, strength & endurance activities   Consulted and Agree with Plan of Care Patient;Family member/caregiver   Family Member Consulted wife      Problem List Patient  Active Problem List   Diagnosis Date Noted  . Diarrhea 08/14/2014  . Dehydration 10/02/2013  . Fever 10/02/2013  . Altered mental status 10/01/2013  . Encephalopathy, toxic 10/01/2013  . Encephalopathy, metabolic 23/55/7322  . CVA (cerebral vascular accident) 09/28/2013  . FTT (failure to thrive) in adult 09/28/2013  . Acute confusional state 09/28/2013  . Ulcer of sacral region, stage 3 09/26/2013  . S/P BKA (below knee amputation) bilateral 09/08/2013  . ESRD on hemodialysis 05/09/2013  . TIA (transient ischemic attack) 02/20/2013  . Acute blood loss anemia 10/28/2012  . Chronic combined systolic (EF 02%) and grade 2diastolic congestive heart failure 10/28/2012  . Obstipation 10/28/2012  . GI bleed 10/27/2012  . Mass in rectum 10/27/2012  . DM (diabetes mellitus) type I controlled with renal manifestation 09/08/2012  . LVH (left ventricular hypertrophy)-severe concentric 06/13/2012  . Anemia due to chronic illness 06/12/2012  . Hyperlipidemia 01/30/2011  . CHOLELITHIASIS 08/01/2010  . BENIGN PROSTATIC HYPERTROPHY 08/01/2010  . CEREBROVASCULAR ACCIDENT, HX OF 08/06/2009  . Depression 03/18/2009  . Acute combined systolic and diastolic heart failure, NYHA class 2-EF 45% 03/18/2009  . NEPHROLITHIASIS, HX OF 03/18/2009  . Morbid obesity 03/25/2007  . Essential hypertension 03/25/2007  . GERD 03/25/2007  . HEPATITIS C, HX OF 03/25/2007    Willow Ora 11/21/2014, 1:56 PM  Willow Ora, PTA, [facility] [address], [place], [place] [address] [phone] 11/21/2014, 1:56 PM

## 2014-11-22 DIAGNOSIS — N186 End stage renal disease: Secondary | ICD-10-CM | POA: Diagnosis not present

## 2014-11-22 DIAGNOSIS — E1122 Type 2 diabetes mellitus with diabetic chronic kidney disease: Secondary | ICD-10-CM | POA: Diagnosis not present

## 2014-11-22 DIAGNOSIS — N2581 Secondary hyperparathyroidism of renal origin: Secondary | ICD-10-CM | POA: Diagnosis not present

## 2014-11-23 ENCOUNTER — Ambulatory Visit: Payer: Medicare Other | Admitting: Physical Therapy

## 2014-11-23 ENCOUNTER — Encounter: Payer: Self-pay | Admitting: Physical Therapy

## 2014-11-23 DIAGNOSIS — M24669 Ankylosis, unspecified knee: Secondary | ICD-10-CM

## 2014-11-23 DIAGNOSIS — R5381 Other malaise: Secondary | ICD-10-CM

## 2014-11-23 DIAGNOSIS — Z89511 Acquired absence of right leg below knee: Secondary | ICD-10-CM | POA: Diagnosis not present

## 2014-11-23 DIAGNOSIS — R4189 Other symptoms and signs involving cognitive functions and awareness: Secondary | ICD-10-CM | POA: Diagnosis not present

## 2014-11-23 DIAGNOSIS — Z7409 Other reduced mobility: Secondary | ICD-10-CM | POA: Diagnosis not present

## 2014-11-23 DIAGNOSIS — R531 Weakness: Secondary | ICD-10-CM

## 2014-11-23 DIAGNOSIS — R6889 Other general symptoms and signs: Secondary | ICD-10-CM

## 2014-11-23 DIAGNOSIS — Z89512 Acquired absence of left leg below knee: Secondary | ICD-10-CM | POA: Diagnosis not present

## 2014-11-23 DIAGNOSIS — R269 Unspecified abnormalities of gait and mobility: Secondary | ICD-10-CM

## 2014-11-23 NOTE — Therapy (Signed)
[place] [address] [place], [place], [address] Phone: [phone]   Fax:  [phone]  Physical Therapy Treatment  Patient Details  Name: Oscar Castillo MRN: [id number] Date of Birth: 1951/02/08 Referring Provider:  [name], MD  Encounter Date: 11/23/2014      PT End of Session - 11/23/14 1457    Visit Number 23  [id number]   Number of Visits 34   Date for PT Re-Evaluation 01/02/15   PT Start Time 1448   PT Stop Time 1528   PT Time Calculation (min) 40 min   Equipment Utilized During Treatment Gait belt   Activity Tolerance Patient limited by fatigue;Patient limited by pain   Behavior During Therapy [facility] for tasks assessed/performed      Past Medical History  Diagnosis Date  . ESRD on hemodialysis 05/05/2007    ESRD due to DM/HTN. Started dialysis in November 2013.  HD TTS at [facility] on [address].  [name] BACK PAIN, LUMBAR, CHRONIC 08/06/2009  . BENIGN PROSTATIC HYPERTROPHY 08/01/2010  . CEREBROVASCULAR ACCIDENT, HX OF 08/06/2009  . CHOLELITHIASIS 08/01/2010  . CONGESTIVE HEART FAILURE 03/18/2009  . DEPRESSION 03/18/2009  . DIABETES MELLITUS, TYPE II 03/25/2007  . ERECTILE DYSFUNCTION 03/25/2007  . GERD 03/25/2007  . HEPATITIS C, HX OF 03/25/2007  . HYPERTENSION 03/25/2007  . Morbid obesity 03/25/2007  . NEPHROLITHIASIS, HX OF 03/18/2009  . Complication of anesthesia     wife states pt had trouble waking up with his last surgery in Nov., 2014    Past Surgical History  Procedure Laterality Date  . Nephrectomy      partial RR  . Av fistula placement  06/14/2012    Procedure: ARTERIOVENOUS (AV) FISTULA CREATION;  Surgeon: Angelia Mould, MD;  Location: [facility] OR;  Service: Vascular;  Laterality: Left;  Left basilic vein transposition with fistula.  . Tibia im nail insertion Left 09/09/2012    Procedure: INTRAMEDULLARY (IM) NAIL TIBIAL;  Surgeon: Johnny Bridge, MD;  Location: [place];  Service: Orthopedics;   Laterality: Left;  left tibial nail and open reduction internal fixation left fibula fracture  . Orif fibula fracture Left 09/09/2012    Procedure: OPEN REDUCTION INTERNAL FIXATION (ORIF) FIBULA FRACTURE;  Surgeon: Johnny Bridge, MD;  Location: [place];  Service: Orthopedics;  Laterality: Left;  . Colonoscopy N/A 10/28/2012    Procedure: COLONOSCOPY;  Surgeon: Jeryl Columbia, MD;  Location: [facility] ENDOSCOPY;  Service: Endoscopy;  Laterality: N/A;  . Esophagogastroduodenoscopy N/A 11/02/2012    Procedure: ESOPHAGOGASTRODUODENOSCOPY (EGD);  Surgeon: Cleotis Nipper, MD;  Location: [facility] ENDOSCOPY;  Service: Endoscopy;  Laterality: N/A;  . Colonoscopy N/A 11/02/2012    Procedure: COLONOSCOPY;  Surgeon: Cleotis Nipper, MD;  Location: [facility] ENDOSCOPY;  Service: Endoscopy;  Laterality: N/A;  . Colonoscopy N/A 11/03/2012    Procedure: COLONOSCOPY;  Surgeon: Cleotis Nipper, MD;  Location: [facility] ENDOSCOPY;  Service: Endoscopy;  Laterality: N/A;  . Givens capsule study N/A 11/04/2012    Procedure: GIVENS CAPSULE STUDY;  Surgeon: Cleotis Nipper, MD;  Location: [facility] ENDOSCOPY;  Service: Endoscopy;  Laterality: N/A;  . Enteroscopy N/A 11/08/2012    Procedure: ENTEROSCOPY;  Surgeon: Wonda Horner, MD;  Location: [facility] ENDOSCOPY;  Service: Endoscopy;  Laterality: N/A;  . Amputation Left 05/12/2013    Procedure: AMPUTATION RAY;  Surgeon: Newt Minion, MD;  Location: [place];  Service: Orthopedics;  Laterality: Left;  Left Foot 1st Ray Amputation  . Eye surgery  Left     to remove scar tissue  . Amputation Left 06/09/2013    Procedure: AMPUTATION BELOW KNEE;  Surgeon: Newt Minion, MD;  Location: [place];  Service: Orthopedics;  Laterality: Left;  Left Below Knee Amputation and removal proximal screws IM tibial nail  . Hardware removal Left 06/09/2013    Procedure: HARDWARE REMOVAL;  Surgeon: Newt Minion, MD;  Location: [place];  Service: Orthopedics;  Laterality: Left;  Left Below Knee Amputation  and Removal proximal screws IM tibial  nail  . Amputation Right 09/08/2013    Procedure: AMPUTATION BELOW KNEE;  Surgeon: Newt Minion, MD;  Location: [place];  Service: Orthopedics;  Laterality: Right;  Right Below Knee Amputation  . Amputation Right 10/11/2013    Procedure: AMPUTATION BELOW KNEE;  Surgeon: Newt Minion, MD;  Location: [place];  Service: Orthopedics;  Laterality: Right;  Right Below Knee Amputation Revision    There were no vitals filed for this visit.  Visit Diagnosis:  Generalized weakness  Decreased range of knee movement, unspecified laterality  Decreased functional activity tolerance  Abnormality of gait  Debility      Subjective Assessment - 11/23/14 1456    Subjective No new complaints. No falls. Some pain still in his arms and hands.   Currently in Pain? Yes   Pain Score 6    Pain Location Arm  and both hands   Pain Orientation Left;Right   Pain Descriptors / Indicators Sore   Pain Type Chronic pain   Pain Frequency Constant   Aggravating Factors  using hands/movement   Pain Relieving Factors rest, medicine          [facility] Adult PT Treatment/Exercise - 11/23/14 1519    Transfers   Sit to Stand 3: Mod assist;With upper extremity assist;From chair/3-in-1   Sit to Stand Details (indicate cue type and reason) wheelchair <> sink x 2 reps, wheelchair <> parallel bars x 3 reps   Stand to Sit 3: Mod assist;With upper extremity assist;To chair/3-in-1   Stand to Sit Details cues to reach back. assist to control descent with sitting down.   Ambulation/Gait   Ambulation/Gait Yes   Ambulation/Gait Assistance 3: Mod assist;4: Min assist   Ambulation/Gait Assistance Details cues/assist for posture, knees blocked in stance with gentle overpressure for knee extension in stance. cues to advance arms and for increased foot clearance with swing phase.                           Ambulation Distance (Feet) 10 Feet  x 3 laps in parallel bars   Assistive device Prostheses;Parallel bars   Gait Pattern  Step-through pattern;Decreased stride length;Trunk flexed;Decreased step length - right;Decreased step length - left;Right flexed knee in stance;Left flexed knee in stance;Decreased hip/knee flexion - right;Decreased hip/knee flexion - left   Ambulation Surface Level;Indoor   Knee/Hip Exercises: Stretches   Passive Hamstring Stretch 1 rep;60 seconds   Passive Hamstring Stretch Limitations with prosthesis on and propped onto therapist leg with therapist on rolling stool, gentle overpressure to knee for increase extension stretching.                  Knee/Hip Exercises: Aerobic   Stationary Bike Scifit (from wheelchair with wheels wedged) x 4 extremities,  level 2.2 x 7 minutes            PT Short Term Goals - 10/31/14 1145    PT SHORT TERM GOAL #1  Title donnes prostheses with minimal assist (Target Date: 10/02/14)   Baseline MET 10/31/14   Time 1   Period Months   Status Achieved   PT SHORT TERM GOAL #2   Title wife & patient verbalize adjusting wear schedule to maximize functional potential. (Target Date: 10/02/14)   Time 1   Period Months   Status Achieved   PT SHORT TERM GOAL #3   Title performs squat-pivot transfer w/c to level mat with minimal assist. (Target Date: 10/02/14)   Baseline MET 09/26/14   Time 1   Period Months   Status Achieved   PT SHORT TERM GOAL #4   Title Sit to/from stand w/c to parallel bars with moderate assist (Target Date: 10/02/14)   Baseline MET 09/26/14   Time 1   Period Months   Status Achieved   PT SHORT TERM GOAL #5   Title patient & wife demonstrate initial HEP correctly. (Target Date: 10/02/14)   Baseline MET 09/21/14   Time 1   Period Months   Status Achieved   Additional Short Term Goals   Additional Short Term Goals Yes   PT SHORT TERM GOAL #6   Title reports wear of prostheses daily >80% of awake hours. (Target Date: 11/02/14)   Baseline Partially MET Patient wears prostheses >80% of out of bed time which is ~60% of awake hours.   Time 30   Period  Days   Status Partially Met   PT SHORT TERM GOAL #7   Title Squat-pivot transfer with supervision. (Target Date: 11/02/14)   Baseline Partially MET 10/31/14 Squat-pivot transfer with contact assist for safety without physical assist.   Time 30   Period Days   Status Partially Met   PT SHORT TERM GOAL #8   Title Sit to / from stand w/c to RW with moderate assist. (Target Date: 11/02/14)   Baseline MET 10/31/14   Time 30   Period Days   Status New   PT SHORT TERM GOAL #9   TITLE stand-pivot transfer including sit to/from stand with rolling walker and prostheses with modA. (Target Date:11/30/2014)   Time 4   Period Weeks   Status New   PT SHORT TERM GOAL #10   TITLE ambulates 20' with rolling walker and prostheses with moderate assist. (Target Date:11/30/2014)   Time 4   Period Weeks   Status New   PT SHORT TERM GOAL #11   TITLE reports wear >80% of out of bed awake hours except dialysis without skin issues or pain. (Target Date:11/30/2014)   Time 4   Period Weeks   Status New           PT Long Term Goals - 10/31/14 1145    PT LONG TERM GOAL #1   Title Patient and wife demonstrate proper prosthetic care. (Target Date: 01/02/15)   Time 4   Period Months   Status New   PT LONG TERM GOAL #2   Title tolerates wear of prostheses >90% of awake hours except at dialysis (Target Date: 01/02/15)   Time 4   Period Months   Status On-going   PT LONG TERM GOAL #3   Title stand pivot transfers with rolling walker & prostheses with supervision.  (Target Date: 01/02/15)   Time 4   Period Months   Status Revised   PT LONG TERM GOAL #4   Title manages clothes, reaches 5" and in upper /lower cabinets in standing with UE support with minimal assist.  (Target Date: 01/02/15)  Time 4   Period Months   Status Revised   PT LONG TERM GOAL #5   Title ambulate 19' with rolling walker & prostheses with minimal assist.  (Target Date: 01/02/15)   Time 4   Period Months   Status Revised           Plan -  11/23/14 1458    Clinical Impression Statement Pt making steady progress toward goals. Fatiques quickly and needs encouragement.   Pt will benefit from skilled therapeutic intervention in order to improve on the following deficits Abnormal gait;Decreased activity tolerance;Decreased balance;Decreased endurance;Decreased knowledge of use of DME;Decreased mobility;Decreased range of motion;Decreased strength;Difficulty walking;Other (comment)  prosthetic dependency   Rehab Potential Good   Clinical Impairments Affecting Rehab Potential multiple medical issues over last year causing decondtioning   PT Frequency 2x / week   PT Duration Other (comment)  4 months   PT Treatment/Interventions ADLs/Self Care Home Management;Gait training;Stair training;DME Instruction;Functional mobility training;Therapeutic activities;Therapeutic exercise;Balance training;Neuromuscular re-education;Patient/family education;Manual techniques;Other (comment)  prosthetic training   PT Next Visit Plan beging with sink standing for working towards this at home with spouse and then standing frame, transfers, standing with rolling walker, strength & endurance activities   Consulted and Agree with Plan of Care Patient;Family member/caregiver   Family Member Consulted wife        Problem List Patient Active Problem List   Diagnosis Date Noted  . Diarrhea 08/14/2014  . Dehydration 10/02/2013  . Fever 10/02/2013  . Altered mental status 10/01/2013  . Encephalopathy, toxic 10/01/2013  . Encephalopathy, metabolic 53/66/4403  . CVA (cerebral vascular accident) 09/28/2013  . FTT (failure to thrive) in adult 09/28/2013  . Acute confusional state 09/28/2013  . Ulcer of sacral region, stage 3 09/26/2013  . S/P BKA (below knee amputation) bilateral 09/08/2013  . ESRD on hemodialysis 05/09/2013  . TIA (transient ischemic attack) 02/20/2013  . Acute blood loss anemia 10/28/2012  . Chronic combined systolic (EF 47%) and grade  2diastolic congestive heart failure 10/28/2012  . Obstipation 10/28/2012  . GI bleed 10/27/2012  . Mass in rectum 10/27/2012  . DM (diabetes mellitus) type I controlled with renal manifestation 09/08/2012  . LVH (left ventricular hypertrophy)-severe concentric 06/13/2012  . Anemia due to chronic illness 06/12/2012  . Hyperlipidemia 01/30/2011  . CHOLELITHIASIS 08/01/2010  . BENIGN PROSTATIC HYPERTROPHY 08/01/2010  . CEREBROVASCULAR ACCIDENT, HX OF 08/06/2009  . Depression 03/18/2009  . Acute combined systolic and diastolic heart failure, NYHA class 2-EF 45% 03/18/2009  . NEPHROLITHIASIS, HX OF 03/18/2009  . Morbid obesity 03/25/2007  . Essential hypertension 03/25/2007  . GERD 03/25/2007  . HEPATITIS C, HX OF 03/25/2007    Willow Ora 11/23/2014, 3:59 PM  Willow Ora, PTA, [place] [address] [place], [place] [address] [phone] 11/23/2014, 3:59 PM

## 2014-11-24 DIAGNOSIS — N186 End stage renal disease: Secondary | ICD-10-CM | POA: Diagnosis not present

## 2014-11-24 DIAGNOSIS — Z992 Dependence on renal dialysis: Secondary | ICD-10-CM | POA: Diagnosis not present

## 2014-11-24 DIAGNOSIS — N2581 Secondary hyperparathyroidism of renal origin: Secondary | ICD-10-CM | POA: Diagnosis not present

## 2014-11-24 DIAGNOSIS — E1122 Type 2 diabetes mellitus with diabetic chronic kidney disease: Secondary | ICD-10-CM | POA: Diagnosis not present

## 2014-11-27 DIAGNOSIS — N2581 Secondary hyperparathyroidism of renal origin: Secondary | ICD-10-CM | POA: Diagnosis not present

## 2014-11-27 DIAGNOSIS — N186 End stage renal disease: Secondary | ICD-10-CM | POA: Diagnosis not present

## 2014-11-27 DIAGNOSIS — D631 Anemia in chronic kidney disease: Secondary | ICD-10-CM | POA: Diagnosis not present

## 2014-11-28 ENCOUNTER — Ambulatory Visit: Payer: Medicare Other | Attending: Internal Medicine | Admitting: Physical Therapy

## 2014-11-28 ENCOUNTER — Encounter: Payer: Self-pay | Admitting: Physical Therapy

## 2014-11-28 VITALS — BP 113/71 | HR 84

## 2014-11-28 DIAGNOSIS — Z7409 Other reduced mobility: Secondary | ICD-10-CM | POA: Insufficient documentation

## 2014-11-28 DIAGNOSIS — R2689 Other abnormalities of gait and mobility: Secondary | ICD-10-CM

## 2014-11-28 DIAGNOSIS — Z89511 Acquired absence of right leg below knee: Secondary | ICD-10-CM | POA: Diagnosis not present

## 2014-11-28 DIAGNOSIS — R531 Weakness: Secondary | ICD-10-CM | POA: Diagnosis not present

## 2014-11-28 DIAGNOSIS — Z89512 Acquired absence of left leg below knee: Secondary | ICD-10-CM | POA: Diagnosis not present

## 2014-11-28 DIAGNOSIS — R5381 Other malaise: Secondary | ICD-10-CM | POA: Diagnosis not present

## 2014-11-28 DIAGNOSIS — R4189 Other symptoms and signs involving cognitive functions and awareness: Secondary | ICD-10-CM | POA: Insufficient documentation

## 2014-11-28 DIAGNOSIS — R6889 Other general symptoms and signs: Secondary | ICD-10-CM

## 2014-11-28 DIAGNOSIS — M24669 Ankylosis, unspecified knee: Secondary | ICD-10-CM

## 2014-11-29 DIAGNOSIS — N2581 Secondary hyperparathyroidism of renal origin: Secondary | ICD-10-CM | POA: Diagnosis not present

## 2014-11-29 DIAGNOSIS — D631 Anemia in chronic kidney disease: Secondary | ICD-10-CM | POA: Diagnosis not present

## 2014-11-29 DIAGNOSIS — N186 End stage renal disease: Secondary | ICD-10-CM | POA: Diagnosis not present

## 2014-11-29 NOTE — Therapy (Signed)
[facility] [address] [place], [place], [address] Phone: [phone]   Fax:  [phone]  Physical Therapy Treatment  Patient Details  Name: Oscar Castillo MRN: [id number] Date of Birth: 09-22-1950 Referring Provider:  [name], MD  Encounter Date: 11/28/2014      PT End of Session - 11/28/14 0930    Visit Number 24  [id number]   Number of Visits 34   Date for PT Re-Evaluation 01/02/15   Authorization Type G code   PT Start Time 0935   PT Stop Time 1015   PT Time Calculation (min) 40 min   Equipment Utilized During Treatment Gait belt   Activity Tolerance Patient limited by fatigue;Patient limited by pain   Behavior During Therapy [facility] for tasks assessed/performed      Past Medical History  Diagnosis Date  . ESRD on hemodialysis 05/05/2007    ESRD due to DM/HTN. Started dialysis in November 2013.  HD TTS at [facility] on [place].  [name] BACK PAIN, LUMBAR, CHRONIC 08/06/2009  . BENIGN PROSTATIC HYPERTROPHY 08/01/2010  . CEREBROVASCULAR ACCIDENT, HX OF 08/06/2009  . CHOLELITHIASIS 08/01/2010  . CONGESTIVE HEART FAILURE 03/18/2009  . DEPRESSION 03/18/2009  . DIABETES MELLITUS, TYPE II 03/25/2007  . ERECTILE DYSFUNCTION 03/25/2007  . GERD 03/25/2007  . HEPATITIS C, HX OF 03/25/2007  . HYPERTENSION 03/25/2007  . Morbid obesity 03/25/2007  . NEPHROLITHIASIS, HX OF 03/18/2009  . Complication of anesthesia     wife states pt had trouble waking up with his last surgery in Nov., 2014    Past Surgical History  Procedure Laterality Date  . Nephrectomy      partial RR  . Av fistula placement  06/14/2012    Procedure: ARTERIOVENOUS (AV) FISTULA CREATION;  Surgeon: Angelia Mould, MD;  Location: [facility] OR;  Service: Vascular;  Laterality: Left;  Left basilic vein transposition with fistula.  . Tibia im nail insertion Left 09/09/2012    Procedure: INTRAMEDULLARY (IM) NAIL TIBIAL;  Surgeon: Johnny Bridge, MD;  Location: [facility];   Service: Orthopedics;  Laterality: Left;  left tibial nail and open reduction internal fixation left fibula fracture  . Orif fibula fracture Left 09/09/2012    Procedure: OPEN REDUCTION INTERNAL FIXATION (ORIF) FIBULA FRACTURE;  Surgeon: Johnny Bridge, MD;  Location: [place];  Service: Orthopedics;  Laterality: Left;  . Colonoscopy N/A 10/28/2012    Procedure: COLONOSCOPY;  Surgeon: Jeryl Columbia, MD;  Location: [facility] ENDOSCOPY;  Service: Endoscopy;  Laterality: N/A;  . Esophagogastroduodenoscopy N/A 11/02/2012    Procedure: ESOPHAGOGASTRODUODENOSCOPY (EGD);  Surgeon: Cleotis Nipper, MD;  Location: [facility] ENDOSCOPY;  Service: Endoscopy;  Laterality: N/A;  . Colonoscopy N/A 11/02/2012    Procedure: COLONOSCOPY;  Surgeon: Cleotis Nipper, MD;  Location: [facility] ENDOSCOPY;  Service: Endoscopy;  Laterality: N/A;  . Colonoscopy N/A 11/03/2012    Procedure: COLONOSCOPY;  Surgeon: Cleotis Nipper, MD;  Location: [facility] ENDOSCOPY;  Service: Endoscopy;  Laterality: N/A;  . Givens capsule study N/A 11/04/2012    Procedure: GIVENS CAPSULE STUDY;  Surgeon: Cleotis Nipper, MD;  Location: [facility] ENDOSCOPY;  Service: Endoscopy;  Laterality: N/A;  . Enteroscopy N/A 11/08/2012    Procedure: ENTEROSCOPY;  Surgeon: Wonda Horner, MD;  Location: [facility] ENDOSCOPY;  Service: Endoscopy;  Laterality: N/A;  . Amputation Left 05/12/2013    Procedure: AMPUTATION RAY;  Surgeon: Newt Minion, MD;  Location: [place];  Service: Orthopedics;  Laterality: Left;  Left Foot 1st  Ray Amputation  . Eye surgery Left     to remove scar tissue  . Amputation Left 06/09/2013    Procedure: AMPUTATION BELOW KNEE;  Surgeon: Newt Minion, MD;  Location: [place];  Service: Orthopedics;  Laterality: Left;  Left Below Knee Amputation and removal proximal screws IM tibial nail  . Hardware removal Left 06/09/2013    Procedure: HARDWARE REMOVAL;  Surgeon: Newt Minion, MD;  Location: [place];  Service: Orthopedics;  Laterality: Left;  Left Below Knee Amputation  and Removal  proximal screws IM tibial nail  . Amputation Right 09/08/2013    Procedure: AMPUTATION BELOW KNEE;  Surgeon: Newt Minion, MD;  Location: [place];  Service: Orthopedics;  Laterality: Right;  Right Below Knee Amputation  . Amputation Right 10/11/2013    Procedure: AMPUTATION BELOW KNEE;  Surgeon: Newt Minion, MD;  Location: [place];  Service: Orthopedics;  Laterality: Right;  Right Below Knee Amputation Revision    Filed Vitals:   11/28/14 0937  BP: 113/71  Pulse: 84    Visit Diagnosis:  Generalized weakness  Decreased range of knee movement, unspecified laterality  Decreased functional activity tolerance  Balance problems  Status post bilateral below knee amputation      Subjective Assessment - 11/28/14 0936    Subjective Pt states dialysis was 'difficult' yesterday and he was having issues with low blood pressure that he managed with medication. Pt complains of soreness in RUE   Patient is accompained by: Family member   Currently in Pain? Yes   Pain Score 1    Pain Location Shoulder   Pain Orientation Right   Pain Descriptors / Indicators Aching;Sore   Pain Type Acute pain   Pain Onset Efrain Sella PT Assessment - 11/28/14 0933    Assessment   Medical Diagnosis Bil. TTA, CVA   Transfers   Sit to Stand 3: Mod assist;With upper extremity assist;From chair/3-in-1  RW for balance stabilization   Sit to Stand Details (indicate cue type and reason) Verbal cues for posture and      Sit to stand 5 reps with first 2 ModA, then MaxA due to fatigue w/c using armrests to RW. Squat-pivot transfer over armrest to simulate to/from [facility] with armrests that don't drop down with CGA. Stand-pivot transfer with RW with modA.  Sitting on device that spins with UE support using abdominal muscles to rotate legs to left & right with tactile / verbal cues.                         PT Education - 11/28/14 0930    Education provided Yes   Education Details  wear all out-of- bed hours to decrease fall risk and improve transfers, sit to /from stand wt shift / technique,   Person(s) Educated Patient;Spouse   Methods Explanation;Demonstration   Comprehension Verbalized understanding;Returned demonstration;Verbal cues required;Tactile cues required;Need further instruction          PT Short Term Goals - 10/31/14 1145    PT SHORT TERM GOAL #1   Title donnes prostheses with minimal assist (Target Date: 10/02/14)   Baseline MET 10/31/14   Time 1   Period Months   Status Achieved   PT SHORT TERM GOAL #2   Title wife & patient verbalize adjusting wear schedule to maximize functional potential. (Target Date: 10/02/14)   Time 1   Period Months   Status  Achieved   PT SHORT TERM GOAL #3   Title performs squat-pivot transfer w/c to level mat with minimal assist. (Target Date: 10/02/14)   Baseline MET 09/26/14   Time 1   Period Months   Status Achieved   PT SHORT TERM GOAL #4   Title Sit to/from stand w/c to parallel bars with moderate assist (Target Date: 10/02/14)   Baseline MET 09/26/14   Time 1   Period Months   Status Achieved   PT SHORT TERM GOAL #5   Title patient & wife demonstrate initial HEP correctly. (Target Date: 10/02/14)   Baseline MET 09/21/14   Time 1   Period Months   Status Achieved   Additional Short Term Goals   Additional Short Term Goals Yes   PT SHORT TERM GOAL #6   Title reports wear of prostheses daily >80% of awake hours. (Target Date: 11/02/14)   Baseline Partially MET Patient wears prostheses >80% of out of bed time which is ~60% of awake hours.   Time 30   Period Days   Status Partially Met   PT SHORT TERM GOAL #7   Title Squat-pivot transfer with supervision. (Target Date: 11/02/14)   Baseline Partially MET 10/31/14 Squat-pivot transfer with contact assist for safety without physical assist.   Time 30   Period Days   Status Partially Met   PT SHORT TERM GOAL #8   Title Sit to / from stand w/c to RW with moderate assist.  (Target Date: 11/02/14)   Baseline MET 10/31/14   Time 30   Period Days   Status New   PT SHORT TERM GOAL #9   TITLE stand-pivot transfer including sit to/from stand with rolling walker and prostheses with modA. (Target Date:11/30/2014)   Time 4   Period Weeks   Status New   PT SHORT TERM GOAL #10   TITLE ambulates 20' with rolling walker and prostheses with moderate assist. (Target Date:11/30/2014)   Time 4   Period Weeks   Status New   PT SHORT TERM GOAL #11   TITLE reports wear >80% of out of bed awake hours except dialysis without skin issues or pain. (Target Date:11/30/2014)   Time 4   Period Weeks   Status New           PT Long Term Goals - 10/31/14 1145    PT LONG TERM GOAL #1   Title Patient and wife demonstrate proper prosthetic care. (Target Date: 01/02/15)   Time 4   Period Months   Status New   PT LONG TERM GOAL #2   Title tolerates wear of prostheses >90% of awake hours except at dialysis (Target Date: 01/02/15)   Time 4   Period Months   Status On-going   PT LONG TERM GOAL #3   Title stand pivot transfers with rolling walker & prostheses with supervision.  (Target Date: 01/02/15)   Time 4   Period Months   Status Revised   PT LONG TERM GOAL #4   Title manages clothes, reaches 5" and in upper /lower cabinets in standing with UE support with minimal assist.  (Target Date: 01/02/15)   Time 4   Period Months   Status Revised   PT LONG TERM GOAL #5   Title ambulate 81' with rolling walker & prostheses with minimal assist.  (Target Date: 01/02/15)   Time 4   Period Months   Status Revised  Plan - 11/28/14 0930    Clinical Impression Statement Patient was very fatigued from dialysis yesterday with BP   Pt will benefit from skilled therapeutic intervention in order to improve on the following deficits Abnormal gait;Decreased activity tolerance;Decreased balance;Decreased endurance;Decreased knowledge of use of DME;Decreased mobility;Decreased range of  motion;Decreased strength;Difficulty walking;Other (comment)  prosthetic dependency   Rehab Potential Good   Clinical Impairments Affecting Rehab Potential multiple medical issues over last year causing decondtioning   PT Frequency 2x / week   PT Duration Other (comment)  4 months   PT Treatment/Interventions ADLs/Self Care Home Management;Gait training;Stair training;DME Instruction;Functional mobility training;Therapeutic activities;Therapeutic exercise;Balance training;Neuromuscular re-education;Patient/family education;Manual techniques;Other (comment)  prosthetic training   PT Next Visit Plan assess STGs for this certification period, transfers, standing and gait.   Consulted and Agree with Plan of Care Patient;Family member/caregiver   Family Member Consulted wife        Problem List Patient Active Problem List   Diagnosis Date Noted  . Diarrhea 08/14/2014  . Dehydration 10/02/2013  . Fever 10/02/2013  . Altered mental status 10/01/2013  . Encephalopathy, toxic 10/01/2013  . Encephalopathy, metabolic 24/26/8341  . CVA (cerebral vascular accident) 09/28/2013  . FTT (failure to thrive) in adult 09/28/2013  . Acute confusional state 09/28/2013  . Ulcer of sacral region, stage 3 09/26/2013  . S/P BKA (below knee amputation) bilateral 09/08/2013  . ESRD on hemodialysis 05/09/2013  . TIA (transient ischemic attack) 02/20/2013  . Acute blood loss anemia 10/28/2012  . Chronic combined systolic (EF 96%) and grade 2diastolic congestive heart failure 10/28/2012  . Obstipation 10/28/2012  . GI bleed 10/27/2012  . Mass in rectum 10/27/2012  . DM (diabetes mellitus) type I controlled with renal manifestation 09/08/2012  . LVH (left ventricular hypertrophy)-severe concentric 06/13/2012  . Anemia due to chronic illness 06/12/2012  . Hyperlipidemia 01/30/2011  . CHOLELITHIASIS 08/01/2010  . BENIGN PROSTATIC HYPERTROPHY 08/01/2010  . CEREBROVASCULAR ACCIDENT, HX OF 08/06/2009  .  Depression 03/18/2009  . Acute combined systolic and diastolic heart failure, NYHA class 2-EF 45% 03/18/2009  . NEPHROLITHIASIS, HX OF 03/18/2009  . Morbid obesity 03/25/2007  . Essential hypertension 03/25/2007  . GERD 03/25/2007  . HEPATITIS C, HX OF 03/25/2007    Jamey Reas PT, DPT 11/29/2014, 10:30 AM  [facility] [address] [place], [place], [address] Phone: [phone]   Fax:  [phone]

## 2014-11-30 ENCOUNTER — Encounter: Payer: Self-pay | Admitting: Physical Therapy

## 2014-11-30 ENCOUNTER — Ambulatory Visit: Payer: Medicare Other | Admitting: Physical Therapy

## 2014-11-30 DIAGNOSIS — R5381 Other malaise: Secondary | ICD-10-CM

## 2014-11-30 DIAGNOSIS — R531 Weakness: Secondary | ICD-10-CM | POA: Diagnosis not present

## 2014-11-30 DIAGNOSIS — Z7409 Other reduced mobility: Secondary | ICD-10-CM | POA: Diagnosis not present

## 2014-11-30 DIAGNOSIS — Z89512 Acquired absence of left leg below knee: Secondary | ICD-10-CM | POA: Diagnosis not present

## 2014-11-30 DIAGNOSIS — R6889 Other general symptoms and signs: Secondary | ICD-10-CM

## 2014-11-30 DIAGNOSIS — Z89511 Acquired absence of right leg below knee: Secondary | ICD-10-CM | POA: Diagnosis not present

## 2014-11-30 DIAGNOSIS — R4189 Other symptoms and signs involving cognitive functions and awareness: Secondary | ICD-10-CM | POA: Diagnosis not present

## 2014-11-30 DIAGNOSIS — R269 Unspecified abnormalities of gait and mobility: Secondary | ICD-10-CM

## 2014-11-30 DIAGNOSIS — M24669 Ankylosis, unspecified knee: Secondary | ICD-10-CM

## 2014-12-01 DIAGNOSIS — D631 Anemia in chronic kidney disease: Secondary | ICD-10-CM | POA: Diagnosis not present

## 2014-12-01 DIAGNOSIS — N186 End stage renal disease: Secondary | ICD-10-CM | POA: Diagnosis not present

## 2014-12-01 DIAGNOSIS — N2581 Secondary hyperparathyroidism of renal origin: Secondary | ICD-10-CM | POA: Diagnosis not present

## 2014-12-03 ENCOUNTER — Ambulatory Visit: Payer: Medicare Other | Admitting: Physical Therapy

## 2014-12-03 ENCOUNTER — Encounter: Payer: Self-pay | Admitting: Physical Therapy

## 2014-12-03 DIAGNOSIS — Z89512 Acquired absence of left leg below knee: Secondary | ICD-10-CM | POA: Diagnosis not present

## 2014-12-03 DIAGNOSIS — R5381 Other malaise: Secondary | ICD-10-CM | POA: Diagnosis not present

## 2014-12-03 DIAGNOSIS — R6889 Other general symptoms and signs: Secondary | ICD-10-CM

## 2014-12-03 DIAGNOSIS — M24669 Ankylosis, unspecified knee: Secondary | ICD-10-CM

## 2014-12-03 DIAGNOSIS — R269 Unspecified abnormalities of gait and mobility: Secondary | ICD-10-CM

## 2014-12-03 DIAGNOSIS — R531 Weakness: Secondary | ICD-10-CM

## 2014-12-03 DIAGNOSIS — Z89511 Acquired absence of right leg below knee: Secondary | ICD-10-CM | POA: Diagnosis not present

## 2014-12-03 DIAGNOSIS — R4189 Other symptoms and signs involving cognitive functions and awareness: Secondary | ICD-10-CM | POA: Diagnosis not present

## 2014-12-03 DIAGNOSIS — Z7409 Other reduced mobility: Secondary | ICD-10-CM | POA: Diagnosis not present

## 2014-12-03 DIAGNOSIS — R2689 Other abnormalities of gait and mobility: Secondary | ICD-10-CM

## 2014-12-03 NOTE — Therapy (Signed)
[facility] [address] [place], [place], [address] Phone: [phone]   Fax:  [phone]  Physical Therapy Treatment  Patient Details  Name: Oscar Castillo MRN: [id number] Date of Birth: 1950-08-13 Referring Provider:  [name], MD  Encounter Date: 11/30/2014    11/30/14 1021  PT Visits / Re-Eval  Visit Number 25 ([id number])  Number of Visits 34  Date for PT Re-Evaluation 01/02/15  Authorization  Authorization Type G code  PT Time Calculation  PT Start Time 1016  PT Stop Time 1055  PT Time Calculation (min) 39 min  PT - End of Session  Equipment Utilized During Treatment Gait belt  Activity Tolerance Patient limited by fatigue;Patient limited by pain  Behavior During Therapy [facility] for tasks assessed/performed     Past Medical History  Diagnosis Date  . ESRD on hemodialysis 05/05/2007    ESRD due to DM/HTN. Started dialysis in November 2013.  HD TTS at [facility] on [place].  [name] BACK PAIN, LUMBAR, CHRONIC 08/06/2009  . BENIGN PROSTATIC HYPERTROPHY 08/01/2010  . CEREBROVASCULAR ACCIDENT, HX OF 08/06/2009  . CHOLELITHIASIS 08/01/2010  . CONGESTIVE HEART FAILURE 03/18/2009  . DEPRESSION 03/18/2009  . DIABETES MELLITUS, TYPE II 03/25/2007  . ERECTILE DYSFUNCTION 03/25/2007  . GERD 03/25/2007  . HEPATITIS C, HX OF 03/25/2007  . HYPERTENSION 03/25/2007  . Morbid obesity 03/25/2007  . NEPHROLITHIASIS, HX OF 03/18/2009  . Complication of anesthesia     wife states pt had trouble waking up with his last surgery in Nov., 2014    Past Surgical History  Procedure Laterality Date  . Nephrectomy      partial RR  . Av fistula placement  06/14/2012    Procedure: ARTERIOVENOUS (AV) FISTULA CREATION;  Surgeon: Angelia Mould, MD;  Location: [facility] OR;  Service: Vascular;  Laterality: Left;  Left basilic vein transposition with fistula.  . Tibia im nail insertion Left 09/09/2012    Procedure: INTRAMEDULLARY (IM) NAIL TIBIAL;   Surgeon: Johnny Bridge, MD;  Location: [place];  Service: Orthopedics;  Laterality: Left;  left tibial nail and open reduction internal fixation left fibula fracture  . Orif fibula fracture Left 09/09/2012    Procedure: OPEN REDUCTION INTERNAL FIXATION (ORIF) FIBULA FRACTURE;  Surgeon: Johnny Bridge, MD;  Location: [place];  Service: Orthopedics;  Laterality: Left;  . Colonoscopy N/A 10/28/2012    Procedure: COLONOSCOPY;  Surgeon: Jeryl Columbia, MD;  Location: [facility] ENDOSCOPY;  Service: Endoscopy;  Laterality: N/A;  . Esophagogastroduodenoscopy N/A 11/02/2012    Procedure: ESOPHAGOGASTRODUODENOSCOPY (EGD);  Surgeon: Cleotis Nipper, MD;  Location: [facility] ENDOSCOPY;  Service: Endoscopy;  Laterality: N/A;  . Colonoscopy N/A 11/02/2012    Procedure: COLONOSCOPY;  Surgeon: Cleotis Nipper, MD;  Location: [facility] ENDOSCOPY;  Service: Endoscopy;  Laterality: N/A;  . Colonoscopy N/A 11/03/2012    Procedure: COLONOSCOPY;  Surgeon: Cleotis Nipper, MD;  Location: [facility] ENDOSCOPY;  Service: Endoscopy;  Laterality: N/A;  . Givens capsule study N/A 11/04/2012    Procedure: GIVENS CAPSULE STUDY;  Surgeon: Cleotis Nipper, MD;  Location: [facility] ENDOSCOPY;  Service: Endoscopy;  Laterality: N/A;  . Enteroscopy N/A 11/08/2012    Procedure: ENTEROSCOPY;  Surgeon: Wonda Horner, MD;  Location: [facility] ENDOSCOPY;  Service: Endoscopy;  Laterality: N/A;  . Amputation Left 05/12/2013    Procedure: AMPUTATION RAY;  Surgeon: Newt Minion, MD;  Location: [place];  Service: Orthopedics;  Laterality: Left;  Left Foot 1st Ray Amputation  .  Eye surgery Left     to remove scar tissue  . Amputation Left 06/09/2013    Procedure: AMPUTATION BELOW KNEE;  Surgeon: Newt Minion, MD;  Location: [place];  Service: Orthopedics;  Laterality: Left;  Left Below Knee Amputation and removal proximal screws IM tibial nail  . Hardware removal Left 06/09/2013    Procedure: HARDWARE REMOVAL;  Surgeon: Newt Minion, MD;  Location: [place];  Service: Orthopedics;  Laterality:  Left;  Left Below Knee Amputation  and Removal proximal screws IM tibial nail  . Amputation Right 09/08/2013    Procedure: AMPUTATION BELOW KNEE;  Surgeon: Newt Minion, MD;  Location: [place];  Service: Orthopedics;  Laterality: Right;  Right Below Knee Amputation  . Amputation Right 10/11/2013    Procedure: AMPUTATION BELOW KNEE;  Surgeon: Newt Minion, MD;  Location: [place];  Service: Orthopedics;  Laterality: Right;  Right Below Knee Amputation Revision    There were no vitals filed for this visit.  Visit Diagnosis:  Generalized weakness  Decreased range of knee movement, unspecified laterality  Decreased functional activity tolerance  Abnormality of gait  Debility     11/30/14 1024  Transfers  Sit to Stand 3: Mod assist;With upper extremity assist;With armrests;From chair/3-in-1 (2 person assist w/c to walker)  Stand to Sit 3: Mod assist;With upper extremity assist;To chair/3-in-1;With armrests (2 person assist for safety/control of descent)  Ambulation/Gait  Ambulation/Gait Yes  Ambulation/Gait Assistance 3: Mod assist;Other (comment) (2 person assist, plus 3rd person for chair follow)  Ambulation/Gait Assistance Details cues/assistance for posture, walker management and to correct gait deviaitons.        Ambulation Distance (Feet) 22 Feet  Assistive device Rolling walker;Prostheses  Gait Pattern Step-through pattern;Decreased stride length;Trunk flexed;Decreased step length - right;Decreased step length - left;Right flexed knee in stance;Left flexed knee in stance;Decreased hip/knee flexion - right;Decreased hip/knee flexion - left  Ambulation Surface Level;Indoor  Static Standing Balance  Static Standing - Balance Support Bilateral upper extremity supported;During functional activity;Right upper extremity supported;Left upper extremity supported  Static Standing - Level of Assistance Other (comment) (standing frame)  Static Standing - Comment/# of Minutes working on tall  posture, hip/knee extension each time. performed alternating UE raises, alternating cross body reaching, self ball toss, with yellow theraband rows x 10 each arm. x 3 reps of standing in frame.                      Prosthetics  Current prosthetic wear tolerance (days/week)  7 days  Current prosthetic wear tolerance (#hours/day)  most awake hours, ~80% (including to/from dialysis and during dialysis)  Residual limb condition  no issues per pt.  Education Provided Residual limb care;Correct ply sock adjustment  Person(s) Educated Patient;Spouse  Education Method Explanation  Education Method Verbalized understanding  Donning Prosthesis 4  Doffing Prosthesis 4          PT Short Term Goals - 11/30/14 1022    PT SHORT TERM GOAL #1   Title donnes prostheses with minimal assist (Target Date: 10/02/14)   Baseline MET 10/31/14   Time 1   Period Months   Status Achieved   PT SHORT TERM GOAL #2   Title wife & patient verbalize adjusting wear schedule to maximize functional potential. (Target Date: 10/02/14)   Time 1   Period Months   Status Achieved   PT SHORT TERM GOAL #3   Title performs squat-pivot transfer w/c to level mat with minimal assist. (Target  Date: 10/02/14)   Baseline MET 09/26/14   Time 1   Period Months   Status Achieved   PT SHORT TERM GOAL #4   Title Sit to/from stand w/c to parallel bars with moderate assist (Target Date: 10/02/14)   Baseline MET 09/26/14   Time 1   Period Months   Status Achieved   PT SHORT TERM GOAL #5   Title patient & wife demonstrate initial HEP correctly. (Target Date: 10/02/14)   Baseline MET 09/21/14   Time 1   Period Months   Status Achieved   PT SHORT TERM GOAL #6   Title reports wear of prostheses daily >80% of awake hours. (Target Date: 11/02/14)   Baseline Partially MET Patient wears prostheses >80% of out of bed time which is ~60% of awake hours.   Time 30   Period Days   Status Partially Met   PT SHORT TERM GOAL #7   Title Squat-pivot  transfer with supervision. (Target Date: 11/02/14)   Baseline Partially MET 10/31/14 Squat-pivot transfer with contact assist for safety without physical assist.   Time 30   Period Days   Status Partially Met   PT SHORT TERM GOAL #8   Title Sit to / from stand w/c to RW with moderate assist. (Target Date: 11/02/14)   Baseline MET 10/31/14   Time 30   Period Days   Status Achieved   PT SHORT TERM GOAL #9   TITLE stand-pivot transfer including sit to/from stand with rolling walker and prostheses with modA. (Target Date:11/30/2014)   Time 4   Period Weeks   Status On-going   PT SHORT TERM GOAL #10   TITLE ambulates 20' with rolling walker and prostheses with moderate assist. (Target Date:11/30/2014)   Baseline 11/30/14- distance goal met, however needed assist mod assist of 2 people   Time 4   Period Weeks   Status Partially Met   PT SHORT TERM GOAL #11   TITLE reports wear >80% of out of bed awake hours except dialysis without skin issues or pain. (Target Date:11/30/2014)   Baseline 11/30/14: wearing ~80% of awake hours or more, including to/from and during dialysis   Time 4   Period Weeks   Status Achieved           PT Long Term Goals - 10/31/14 1145    PT LONG TERM GOAL #1   Title Patient and wife demonstrate proper prosthetic care. (Target Date: 01/02/15)   Time 4   Period Months   Status New   PT LONG TERM GOAL #2   Title tolerates wear of prostheses >90% of awake hours except at dialysis (Target Date: 01/02/15)   Time 4   Period Months   Status On-going   PT LONG TERM GOAL #3   Title stand pivot transfers with rolling walker & prostheses with supervision.  (Target Date: 01/02/15)   Time 4   Period Months   Status Revised   PT LONG TERM GOAL #4   Title manages clothes, reaches 5" and in upper /lower cabinets in standing with UE support with minimal assist.  (Target Date: 01/02/15)   Time 4   Period Months   Status Revised   PT LONG TERM GOAL #5   Title ambulate 58' with rolling walker  & prostheses with minimal assist.  (Target Date: 01/02/15)   Time 4   Period Months   Status Revised        11/30/14 1021  Plan  Clinical Impression Statement STG's  not met, however progress has been made toward them. Pt is making slow, steady progress. Continues to be limited by pain and fatigue.  Pt will benefit from skilled therapeutic intervention in order to improve on the following deficits Abnormal gait;Decreased activity tolerance;Decreased balance;Decreased endurance;Decreased knowledge of use of DME;Decreased mobility;Decreased range of motion;Decreased strength;Difficulty walking;Other (comment) (prosthetic dependency)  Rehab Potential Good  Clinical Impairments Affecting Rehab Potential multiple medical issues over last year causing decondtioning  PT Frequency 2x / week  PT Duration Other (comment) (4 months)  PT Treatment/Interventions ADLs/Self Care Home Management;Gait training;Stair training;DME Instruction;Functional mobility training;Therapeutic activities;Therapeutic exercise;Balance training;Neuromuscular re-education;Patient/family education;Manual techniques;Other (comment) (prosthetic training)  PT Next Visit Plan Continue on strengthening, balance, and standing/gait with parallel bars/walker as able  Consulted and Agree with Plan of Care Patient;Family member/caregiver  Family Member Consulted wife     Problem List Patient Active Problem List   Diagnosis Date Noted  . Diarrhea 08/14/2014  . Dehydration 10/02/2013  . Fever 10/02/2013  . Altered mental status 10/01/2013  . Encephalopathy, toxic 10/01/2013  . Encephalopathy, metabolic 55/73/2202  . CVA (cerebral vascular accident) 09/28/2013  . FTT (failure to thrive) in adult 09/28/2013  . Acute confusional state 09/28/2013  . Ulcer of sacral region, stage 3 09/26/2013  . S/P BKA (below knee amputation) bilateral 09/08/2013  . ESRD on hemodialysis 05/09/2013  . TIA (transient ischemic attack) 02/20/2013  .  Acute blood loss anemia 10/28/2012  . Chronic combined systolic (EF 54%) and grade 2diastolic congestive heart failure 10/28/2012  . Obstipation 10/28/2012  . GI bleed 10/27/2012  . Mass in rectum 10/27/2012  . DM (diabetes mellitus) type I controlled with renal manifestation 09/08/2012  . LVH (left ventricular hypertrophy)-severe concentric 06/13/2012  . Anemia due to chronic illness 06/12/2012  . Hyperlipidemia 01/30/2011  . CHOLELITHIASIS 08/01/2010  . BENIGN PROSTATIC HYPERTROPHY 08/01/2010  . CEREBROVASCULAR ACCIDENT, HX OF 08/06/2009  . Depression 03/18/2009  . Acute combined systolic and diastolic heart failure, NYHA class 2-EF 45% 03/18/2009  . NEPHROLITHIASIS, HX OF 03/18/2009  . Morbid obesity 03/25/2007  . Essential hypertension 03/25/2007  . GERD 03/25/2007  . HEPATITIS C, HX OF 03/25/2007    Willow Ora 12/03/2014, 12:27 AM  Willow Ora, PTA, [facility] [address], [place], [place] [address] [phone] 12/03/2014, 12:28 AM

## 2014-12-03 NOTE — Therapy (Signed)
[facility] [address] [place], [place], [address] Phone: [phone]   Fax:  [phone]  Physical Therapy Treatment  Patient Details  Name: Oscar Castillo MRN: [id number] Date of Birth: 1950/08/29 Referring Provider:  [name], MD  Encounter Date: 12/03/2014      PT End of Session - 12/03/14 1110    Visit Number 26  [id number]   Number of Visits 34   Date for PT Re-Evaluation 01/02/15   Authorization Type G code   PT Start Time 1110   PT Stop Time 1140   PT Time Calculation (min) 30 min   Equipment Utilized During Treatment Gait belt   Activity Tolerance Patient limited by fatigue;Patient limited by pain   Behavior During Therapy [facility] for tasks assessed/performed      Past Medical History  Diagnosis Date  . ESRD on hemodialysis 05/05/2007    ESRD due to DM/HTN. Started dialysis in November 2013.  HD TTS at [facility] on [address].  [name] BACK PAIN, LUMBAR, CHRONIC 08/06/2009  . BENIGN PROSTATIC HYPERTROPHY 08/01/2010  . CEREBROVASCULAR ACCIDENT, HX OF 08/06/2009  . CHOLELITHIASIS 08/01/2010  . CONGESTIVE HEART FAILURE 03/18/2009  . DEPRESSION 03/18/2009  . DIABETES MELLITUS, TYPE II 03/25/2007  . ERECTILE DYSFUNCTION 03/25/2007  . GERD 03/25/2007  . HEPATITIS C, HX OF 03/25/2007  . HYPERTENSION 03/25/2007  . Morbid obesity 03/25/2007  . NEPHROLITHIASIS, HX OF 03/18/2009  . Complication of anesthesia     wife states pt had trouble waking up with his last surgery in Nov., 2014    Past Surgical History  Procedure Laterality Date  . Nephrectomy      partial RR  . Av fistula placement  06/14/2012    Procedure: ARTERIOVENOUS (AV) FISTULA CREATION;  Surgeon: Angelia Mould, MD;  Location: [facility] OR;  Service: Vascular;  Laterality: Left;  Left basilic vein transposition with fistula.  . Tibia im nail insertion Left 09/09/2012    Procedure: INTRAMEDULLARY (IM) NAIL TIBIAL;  Surgeon: Johnny Bridge, MD;  Location: [place];   Service: Orthopedics;  Laterality: Left;  left tibial nail and open reduction internal fixation left fibula fracture  . Orif fibula fracture Left 09/09/2012    Procedure: OPEN REDUCTION INTERNAL FIXATION (ORIF) FIBULA FRACTURE;  Surgeon: Johnny Bridge, MD;  Location: [place];  Service: Orthopedics;  Laterality: Left;  . Colonoscopy N/A 10/28/2012    Procedure: COLONOSCOPY;  Surgeon: Jeryl Columbia, MD;  Location: [facility] ENDOSCOPY;  Service: Endoscopy;  Laterality: N/A;  . Esophagogastroduodenoscopy N/A 11/02/2012    Procedure: ESOPHAGOGASTRODUODENOSCOPY (EGD);  Surgeon: Cleotis Nipper, MD;  Location: [facility] ENDOSCOPY;  Service: Endoscopy;  Laterality: N/A;  . Colonoscopy N/A 11/02/2012    Procedure: COLONOSCOPY;  Surgeon: Cleotis Nipper, MD;  Location: [facility] ENDOSCOPY;  Service: Endoscopy;  Laterality: N/A;  . Colonoscopy N/A 11/03/2012    Procedure: COLONOSCOPY;  Surgeon: Cleotis Nipper, MD;  Location: [facility] ENDOSCOPY;  Service: Endoscopy;  Laterality: N/A;  . Givens capsule study N/A 11/04/2012    Procedure: GIVENS CAPSULE STUDY;  Surgeon: Cleotis Nipper, MD;  Location: [facility] ENDOSCOPY;  Service: Endoscopy;  Laterality: N/A;  . Enteroscopy N/A 11/08/2012    Procedure: ENTEROSCOPY;  Surgeon: Wonda Horner, MD;  Location: [facility] ENDOSCOPY;  Service: Endoscopy;  Laterality: N/A;  . Amputation Left 05/12/2013    Procedure: AMPUTATION RAY;  Surgeon: Newt Minion, MD;  Location: [place];  Service: Orthopedics;  Laterality: Left;  Left Foot 1st  Ray Amputation  . Eye surgery Left     to remove scar tissue  . Amputation Left 06/09/2013    Procedure: AMPUTATION BELOW KNEE;  Surgeon: Newt Minion, MD;  Location: [place];  Service: Orthopedics;  Laterality: Left;  Left Below Knee Amputation and removal proximal screws IM tibial nail  . Hardware removal Left 06/09/2013    Procedure: HARDWARE REMOVAL;  Surgeon: Newt Minion, MD;  Location: [place];  Service: Orthopedics;  Laterality: Left;  Left Below Knee Amputation  and Removal  proximal screws IM tibial nail  . Amputation Right 09/08/2013    Procedure: AMPUTATION BELOW KNEE;  Surgeon: Newt Minion, MD;  Location: [place];  Service: Orthopedics;  Laterality: Right;  Right Below Knee Amputation  . Amputation Right 10/11/2013    Procedure: AMPUTATION BELOW KNEE;  Surgeon: Newt Minion, MD;  Location: [place];  Service: Orthopedics;  Laterality: Right;  Right Below Knee Amputation Revision    There were no vitals filed for this visit.  Visit Diagnosis:  Generalized weakness  Decreased range of knee movement, unspecified laterality  Decreased functional activity tolerance  Abnormality of gait  Balance problems  Status post bilateral below knee amputation  Impaired functional mobility and activity tolerance      Subjective Assessment - 12/03/14 1255    Subjective Pt states he wore prostheses when out of bed over the weekend but did not give specifics about times. No new complaints today.    Patient is accompained by: Family member   Currently in Pain? No/denies      Prosthetic Training: Sit to stand with  Mod. Assist  from [facility] Stand to sit with  Mod. Assist  to RW Patient ambulated with Mod. Assist Device: Rolling walker & prostheses  Distance: 17'x1, 10'x1 on Indoor  Level surface.  Multi-modal cues for deviations: Pt requires verbal cues for sequencing upon standing and proper prosthesis placement. With increased time, pt can perform sit to stand from [facility] to RW with min A. Becoming stronger and is able to better shift weight onto prostheses.        Pt performed seated 3# ball toss forward, side to side, and deep underhand to work on obliques and trunk extensors.  Transfers: Stand-pivot transfer with RW w/c to/from chair with armrests with modA and cues / on sequence, technique, posture Squat-pivot with armrests removed with minA.       Seated leg press: 50# x10 with use of BLE, 25# x5 each with single LE.        Pt continues to be limited by fatigue and  weakness but had much greater activity tolerance today compared to last week's sessions.                             PT Education - 12/03/14 1110    Education provided Yes   Education Details continue to wear prostheses all awake hours, prostheses placement during sit to stand transfer to increase independence with weight shift and use of RW   Person(s) Educated Patient   Methods Explanation   Comprehension Verbalized understanding;Verbal cues required;Need further instruction          PT Short Term Goals - 11/30/14 1022    PT SHORT TERM GOAL #1   Title donnes prostheses with minimal assist (Target Date: 10/02/14)   Baseline MET 10/31/14   Time 1   Period Months   Status Achieved   PT  SHORT TERM GOAL #2   Title wife & patient verbalize adjusting wear schedule to maximize functional potential. (Target Date: 10/02/14)   Time 1   Period Months   Status Achieved   PT SHORT TERM GOAL #3   Title performs squat-pivot transfer w/c to level mat with minimal assist. (Target Date: 10/02/14)   Baseline MET 09/26/14   Time 1   Period Months   Status Achieved   PT SHORT TERM GOAL #4   Title Sit to/from stand w/c to parallel bars with moderate assist (Target Date: 10/02/14)   Baseline MET 09/26/14   Time 1   Period Months   Status Achieved   PT SHORT TERM GOAL #5   Title patient & wife demonstrate initial HEP correctly. (Target Date: 10/02/14)   Baseline MET 09/21/14   Time 1   Period Months   Status Achieved   PT SHORT TERM GOAL #6   Title reports wear of prostheses daily >80% of awake hours. (Target Date: 11/02/14)   Baseline Partially MET Patient wears prostheses >80% of out of bed time which is ~60% of awake hours.   Time 30   Period Days   Status Partially Met   PT SHORT TERM GOAL #7   Title Squat-pivot transfer with supervision. (Target Date: 11/02/14)   Baseline Partially MET 10/31/14 Squat-pivot transfer with contact assist for safety without physical assist.   Time 30    Period Days   Status Partially Met   PT SHORT TERM GOAL #8   Title Sit to / from stand w/c to RW with moderate assist. (Target Date: 11/02/14)   Baseline MET 10/31/14   Time 30   Period Days   Status Achieved   PT SHORT TERM GOAL #9   TITLE stand-pivot transfer including sit to/from stand with rolling walker and prostheses with modA. (Target Date:11/30/2014)   Time 4   Period Weeks   Status On-going   PT SHORT TERM GOAL #10   TITLE ambulates 20' with rolling walker and prostheses with moderate assist. (Target Date:11/30/2014)   Baseline 11/30/14- distance goal met, however needed assist mod assist of 2 people   Time 4   Period Weeks   Status Partially Met   PT SHORT TERM GOAL #11   TITLE reports wear >80% of out of bed awake hours except dialysis without skin issues or pain. (Target Date:11/30/2014)   Baseline 11/30/14: wearing ~80% of awake hours or more, including to/from and during dialysis   Time 4   Period Weeks   Status Achieved           PT Long Term Goals - 10/31/14 1145    PT LONG TERM GOAL #1   Title Patient and wife demonstrate proper prosthetic care. (Target Date: 01/02/15)   Time 4   Period Months   Status New   PT LONG TERM GOAL #2   Title tolerates wear of prostheses >90% of awake hours except at dialysis (Target Date: 01/02/15)   Time 4   Period Months   Status On-going   PT LONG TERM GOAL #3   Title stand pivot transfers with rolling walker & prostheses with supervision.  (Target Date: 01/02/15)   Time 4   Period Months   Status Revised   PT LONG TERM GOAL #4   Title manages clothes, reaches 5" and in upper /lower cabinets in standing with UE support with minimal assist.  (Target Date: 01/02/15)   Time 4   Period Months   Status  Revised   PT LONG TERM GOAL #5   Title ambulate 16' with rolling walker & prostheses with minimal assist.  (Target Date: 01/02/15)   Time 4   Period Months   Status Revised               Plan - 12/03/14 1110    Clinical  Impression Statement Pt showed improvement in gait distance today as well as duration of activity tolerance.    Pt will benefit from skilled therapeutic intervention in order to improve on the following deficits Abnormal gait;Decreased activity tolerance;Decreased balance;Decreased endurance;Decreased knowledge of use of DME;Decreased mobility;Decreased range of motion;Decreased strength;Difficulty walking;Other (comment)  prosthetic dependency   Rehab Potential Good   Clinical Impairments Affecting Rehab Potential multiple medical issues over last year causing decondtioning   PT Frequency 2x / week   PT Duration Other (comment)  4 months   PT Treatment/Interventions ADLs/Self Care Home Management;Gait training;Stair training;DME Instruction;Functional mobility training;Therapeutic activities;Therapeutic exercise;Balance training;Neuromuscular re-education;Patient/family education;Manual techniques;Other (comment)  prosthetic training   PT Next Visit Plan Continue on strengthening, balance, and standing/gait with parallel bars/walker as able   Consulted and Agree with Plan of Care Patient;Family member/caregiver   Family Member Consulted wife        Problem List Patient Active Problem List   Diagnosis Date Noted  . Diarrhea 08/14/2014  . Dehydration 10/02/2013  . Fever 10/02/2013  . Altered mental status 10/01/2013  . Encephalopathy, toxic 10/01/2013  . Encephalopathy, metabolic 99/83/3825  . CVA (cerebral vascular accident) 09/28/2013  . FTT (failure to thrive) in adult 09/28/2013  . Acute confusional state 09/28/2013  . Ulcer of sacral region, stage 3 09/26/2013  . S/P BKA (below knee amputation) bilateral 09/08/2013  . ESRD on hemodialysis 05/09/2013  . TIA (transient ischemic attack) 02/20/2013  . Acute blood loss anemia 10/28/2012  . Chronic combined systolic (EF 05%) and grade 2diastolic congestive heart failure 10/28/2012  . Obstipation 10/28/2012  . GI bleed 10/27/2012   . Mass in rectum 10/27/2012  . DM (diabetes mellitus) type I controlled with renal manifestation 09/08/2012  . LVH (left ventricular hypertrophy)-severe concentric 06/13/2012  . Anemia due to chronic illness 06/12/2012  . Hyperlipidemia 01/30/2011  . CHOLELITHIASIS 08/01/2010  . BENIGN PROSTATIC HYPERTROPHY 08/01/2010  . CEREBROVASCULAR ACCIDENT, HX OF 08/06/2009  . Depression 03/18/2009  . Acute combined systolic and diastolic heart failure, NYHA class 2-EF 45% 03/18/2009  . NEPHROLITHIASIS, HX OF 03/18/2009  . Morbid obesity 03/25/2007  . Essential hypertension 03/25/2007  . GERD 03/25/2007  . HEPATITIS C, HX OF 03/25/2007   Jamey Reas, PT, DPT PT Specializing in [place] 12/03/2014 1:26 PM Phone:  [phone]  Fax:  [phone] [facility] [address] [place], [place] [address]  Shyhiem Beeney Blair Raphael [place], [place]  12/03/2014, 1:22 PM  [facility] [address] [place], [place], [address] Phone: [phone]   Fax:  [phone]

## 2014-12-04 DIAGNOSIS — N2581 Secondary hyperparathyroidism of renal origin: Secondary | ICD-10-CM | POA: Diagnosis not present

## 2014-12-04 DIAGNOSIS — D631 Anemia in chronic kidney disease: Secondary | ICD-10-CM | POA: Diagnosis not present

## 2014-12-04 DIAGNOSIS — N186 End stage renal disease: Secondary | ICD-10-CM | POA: Diagnosis not present

## 2014-12-05 ENCOUNTER — Encounter: Payer: Self-pay | Admitting: Physical Therapy

## 2014-12-05 ENCOUNTER — Ambulatory Visit: Payer: Medicare Other | Admitting: Physical Therapy

## 2014-12-05 DIAGNOSIS — R269 Unspecified abnormalities of gait and mobility: Secondary | ICD-10-CM

## 2014-12-05 DIAGNOSIS — Z89511 Acquired absence of right leg below knee: Secondary | ICD-10-CM

## 2014-12-05 DIAGNOSIS — Z89512 Acquired absence of left leg below knee: Secondary | ICD-10-CM | POA: Diagnosis not present

## 2014-12-05 DIAGNOSIS — M24669 Ankylosis, unspecified knee: Secondary | ICD-10-CM

## 2014-12-05 DIAGNOSIS — R5381 Other malaise: Secondary | ICD-10-CM | POA: Diagnosis not present

## 2014-12-05 DIAGNOSIS — R531 Weakness: Secondary | ICD-10-CM

## 2014-12-05 DIAGNOSIS — R2689 Other abnormalities of gait and mobility: Secondary | ICD-10-CM

## 2014-12-05 DIAGNOSIS — R4189 Other symptoms and signs involving cognitive functions and awareness: Secondary | ICD-10-CM | POA: Diagnosis not present

## 2014-12-05 DIAGNOSIS — R6889 Other general symptoms and signs: Secondary | ICD-10-CM

## 2014-12-05 DIAGNOSIS — Z7409 Other reduced mobility: Secondary | ICD-10-CM | POA: Diagnosis not present

## 2014-12-05 NOTE — Therapy (Signed)
[facility] [address] [place], [place], [address] Phone: [phone]   Fax:  [phone]  Physical Therapy Treatment  Patient Details  Name: Oscar Castillo MRN: [id number] Date of Birth: 04-Oct-1950 Referring Provider:  [name], MD  Encounter Date: 12/05/2014      PT End of Session - 12/05/14 1100    Visit Number 27  [id number]   Number of Visits 34   Date for PT Re-Evaluation 01/02/15   Authorization Type G code   PT Start Time 1105   PT Stop Time 1146   PT Time Calculation (min) 41 min   Equipment Utilized During Treatment Gait belt   Activity Tolerance Patient limited by fatigue;Patient limited by pain   Behavior During Therapy [facility] for tasks assessed/performed      Past Medical History  Diagnosis Date  . ESRD on hemodialysis 05/05/2007    ESRD due to DM/HTN. Started dialysis in November 2013.  HD TTS at [facility] on [place].  [name] BACK PAIN, LUMBAR, CHRONIC 08/06/2009  . BENIGN PROSTATIC HYPERTROPHY 08/01/2010  . CEREBROVASCULAR ACCIDENT, HX OF 08/06/2009  . CHOLELITHIASIS 08/01/2010  . CONGESTIVE HEART FAILURE 03/18/2009  . DEPRESSION 03/18/2009  . DIABETES MELLITUS, TYPE II 03/25/2007  . ERECTILE DYSFUNCTION 03/25/2007  . GERD 03/25/2007  . HEPATITIS C, HX OF 03/25/2007  . HYPERTENSION 03/25/2007  . Morbid obesity 03/25/2007  . NEPHROLITHIASIS, HX OF 03/18/2009  . Complication of anesthesia     wife states pt had trouble waking up with his last surgery in Nov., 2014    Past Surgical History  Procedure Laterality Date  . Nephrectomy      partial RR  . Av fistula placement  06/14/2012    Procedure: ARTERIOVENOUS (AV) FISTULA CREATION;  Surgeon: Angelia Mould, MD;  Location: [facility] OR;  Service: Vascular;  Laterality: Left;  Left basilic vein transposition with fistula.  . Tibia im nail insertion Left 09/09/2012    Procedure: INTRAMEDULLARY (IM) NAIL TIBIAL;  Surgeon: Johnny Bridge, MD;  Location: [place];   Service: Orthopedics;  Laterality: Left;  left tibial nail and open reduction internal fixation left fibula fracture  . Orif fibula fracture Left 09/09/2012    Procedure: OPEN REDUCTION INTERNAL FIXATION (ORIF) FIBULA FRACTURE;  Surgeon: Johnny Bridge, MD;  Location: [place];  Service: Orthopedics;  Laterality: Left;  . Colonoscopy N/A 10/28/2012    Procedure: COLONOSCOPY;  Surgeon: Jeryl Columbia, MD;  Location: [facility] ENDOSCOPY;  Service: Endoscopy;  Laterality: N/A;  . Esophagogastroduodenoscopy N/A 11/02/2012    Procedure: ESOPHAGOGASTRODUODENOSCOPY (EGD);  Surgeon: Cleotis Nipper, MD;  Location: [facility] ENDOSCOPY;  Service: Endoscopy;  Laterality: N/A;  . Colonoscopy N/A 11/02/2012    Procedure: COLONOSCOPY;  Surgeon: Cleotis Nipper, MD;  Location: [facility] ENDOSCOPY;  Service: Endoscopy;  Laterality: N/A;  . Colonoscopy N/A 11/03/2012    Procedure: COLONOSCOPY;  Surgeon: Cleotis Nipper, MD;  Location: [facility] ENDOSCOPY;  Service: Endoscopy;  Laterality: N/A;  . Givens capsule study N/A 11/04/2012    Procedure: GIVENS CAPSULE STUDY;  Surgeon: Cleotis Nipper, MD;  Location: [facility] ENDOSCOPY;  Service: Endoscopy;  Laterality: N/A;  . Enteroscopy N/A 11/08/2012    Procedure: ENTEROSCOPY;  Surgeon: Wonda Horner, MD;  Location: [facility] ENDOSCOPY;  Service: Endoscopy;  Laterality: N/A;  . Amputation Left 05/12/2013    Procedure: AMPUTATION RAY;  Surgeon: Newt Minion, MD;  Location: [place];  Service: Orthopedics;  Laterality: Left;  Left Foot 1st  Ray Amputation  . Eye surgery Left     to remove scar tissue  . Amputation Left 06/09/2013    Procedure: AMPUTATION BELOW KNEE;  Surgeon: Newt Minion, MD;  Location: [place];  Service: Orthopedics;  Laterality: Left;  Left Below Knee Amputation and removal proximal screws IM tibial nail  . Hardware removal Left 06/09/2013    Procedure: HARDWARE REMOVAL;  Surgeon: Newt Minion, MD;  Location: [place];  Service: Orthopedics;  Laterality: Left;  Left Below Knee Amputation  and Removal  proximal screws IM tibial nail  . Amputation Right 09/08/2013    Procedure: AMPUTATION BELOW KNEE;  Surgeon: Newt Minion, MD;  Location: [place];  Service: Orthopedics;  Laterality: Right;  Right Below Knee Amputation  . Amputation Right 10/11/2013    Procedure: AMPUTATION BELOW KNEE;  Surgeon: Newt Minion, MD;  Location: [place];  Service: Orthopedics;  Laterality: Right;  Right Below Knee Amputation Revision    There were no vitals filed for this visit.  Visit Diagnosis:  Generalized weakness  Decreased range of knee movement, unspecified laterality  Decreased functional activity tolerance  Abnormality of gait  Balance problems  Status post bilateral below knee amputation      Subjective Assessment - 12/05/14 1105    Subjective (p) He is looking forward to vacation next week   Currently in Pain? (p) No/denies      Prosthetic Training: Sit to stand with  Min. Assist  from w/c to RW with verbal cues on technique Stand to sit with  Min. Assist  to w/c from RW but uncontrolled due to UE fatigue. Stand-pivot transfer with RW with maxA of 2 people for safety with verbal & manual cues  Squat-pivot transfer with supervision with chairs touching using UEs. Patient ambulated with Mod. Assist2 people for safety Device: Rolling walker & prostheses  Distance: 15' on Indoor  Level surface.  Multi-modal cues for deviations: posture, knee/hip extension in stance PT instructed in donning prostheses with long pants. Patient & wife verbalize understanding.  Therapeutic Exercise: NuStep recumbent stepper with 4 extremities level 6 for 5 minutes with supervision. Seated green theraband resisted knee flexion 10 reps on each LE Seated trunk AROM for forward lean with recovery, backward lean with recovery, side bend Rt & Lt with recovery 10 reps each. PT recommended performing daily while on vacation. Pt & wife verbalized understanding.                              PT  Short Term Goals - 11/30/14 1022    PT SHORT TERM GOAL #1   Title donnes prostheses with minimal assist (Target Date: 10/02/14)   Baseline MET 10/31/14   Time 1   Period Months   Status Achieved   PT SHORT TERM GOAL #2   Title wife & patient verbalize adjusting wear schedule to maximize functional potential. (Target Date: 10/02/14)   Time 1   Period Months   Status Achieved   PT SHORT TERM GOAL #3   Title performs squat-pivot transfer w/c to level mat with minimal assist. (Target Date: 10/02/14)   Baseline MET 09/26/14   Time 1   Period Months   Status Achieved   PT SHORT TERM GOAL #4   Title Sit to/from stand w/c to parallel bars with moderate assist (Target Date: 10/02/14)   Baseline MET 09/26/14   Time 1   Period Months   Status Achieved  PT SHORT TERM GOAL #5   Title patient & wife demonstrate initial HEP correctly. (Target Date: 10/02/14)   Baseline MET 09/21/14   Time 1   Period Months   Status Achieved   PT SHORT TERM GOAL #6   Title reports wear of prostheses daily >80% of awake hours. (Target Date: 11/02/14)   Baseline Partially MET Patient wears prostheses >80% of out of bed time which is ~60% of awake hours.   Time 30   Period Days   Status Partially Met   PT SHORT TERM GOAL #7   Title Squat-pivot transfer with supervision. (Target Date: 11/02/14)   Baseline Partially MET 10/31/14 Squat-pivot transfer with contact assist for safety without physical assist.   Time 30   Period Days   Status Partially Met   PT SHORT TERM GOAL #8   Title Sit to / from stand w/c to RW with moderate assist. (Target Date: 11/02/14)   Baseline MET 10/31/14   Time 30   Period Days   Status Achieved   PT SHORT TERM GOAL #9   TITLE stand-pivot transfer including sit to/from stand with rolling walker and prostheses with modA. (Target Date:11/30/2014)   Time 4   Period Weeks   Status On-going   PT SHORT TERM GOAL #10   TITLE ambulates 20' with rolling walker and prostheses with moderate assist. (Target  Date:11/30/2014)   Baseline 11/30/14- distance goal met, however needed assist mod assist of 2 people   Time 4   Period Weeks   Status Partially Met   PT SHORT TERM GOAL #11   TITLE reports wear >80% of out of bed awake hours except dialysis without skin issues or pain. (Target Date:11/30/2014)   Baseline 11/30/14: wearing ~80% of awake hours or more, including to/from and during dialysis   Time 4   Period Weeks   Status Achieved           PT Long Term Goals - 10/31/14 1145    PT LONG TERM GOAL #1   Title Patient and wife demonstrate proper prosthetic care. (Target Date: 01/02/15)   Time 4   Period Months   Status New   PT LONG TERM GOAL #2   Title tolerates wear of prostheses >90% of awake hours except at dialysis (Target Date: 01/02/15)   Time 4   Period Months   Status On-going   PT LONG TERM GOAL #3   Title stand pivot transfers with rolling walker & prostheses with supervision.  (Target Date: 01/02/15)   Time 4   Period Months   Status Revised   PT LONG TERM GOAL #4   Title manages clothes, reaches 5" and in upper /lower cabinets in standing with UE support with minimal assist.  (Target Date: 01/02/15)   Time 4   Period Months   Status Revised   PT LONG TERM GOAL #5   Title ambulate 14' with rolling walker & prostheses with minimal assist.  (Target Date: 01/02/15)   Time 4   Period Months   Status Revised               Plan - 12/05/14 1100    Clinical Impression Statement Patient's arms /hands had increased soreness from dialysis limiting activities that utilized his UEs like standing & walking. Patient needed more assist today than 2 days ago but less than last week with stand pivot transfers & gait.   Pt will benefit from skilled therapeutic intervention in order to improve on the following  deficits Abnormal gait;Decreased activity tolerance;Decreased balance;Decreased endurance;Decreased knowledge of use of DME;Decreased mobility;Decreased range of motion;Decreased  strength;Difficulty walking;Other (comment)  prosthetic dependency   Rehab Potential Good   Clinical Impairments Affecting Rehab Potential multiple medical issues over last year causing decondtioning   PT Frequency 2x / week   PT Duration Other (comment)  4 months   PT Treatment/Interventions ADLs/Self Care Home Management;Gait training;Stair training;DME Instruction;Functional mobility training;Therapeutic activities;Therapeutic exercise;Balance training;Neuromuscular re-education;Patient/family education;Manual techniques;Other (comment)  prosthetic training   PT Next Visit Plan Hold 1 week while pt on vacation then Continue on strengthening, balance, and standing/gait with walker   Consulted and Agree with Plan of Care Patient;Family member/caregiver   Family Member Consulted wife        Problem List Patient Active Problem List   Diagnosis Date Noted  . Diarrhea 08/14/2014  . Dehydration 10/02/2013  . Fever 10/02/2013  . Altered mental status 10/01/2013  . Encephalopathy, toxic 10/01/2013  . Encephalopathy, metabolic 25/42/7062  . CVA (cerebral vascular accident) 09/28/2013  . FTT (failure to thrive) in adult 09/28/2013  . Acute confusional state 09/28/2013  . Ulcer of sacral region, stage 3 09/26/2013  . S/P BKA (below knee amputation) bilateral 09/08/2013  . ESRD on hemodialysis 05/09/2013  . TIA (transient ischemic attack) 02/20/2013  . Acute blood loss anemia 10/28/2012  . Chronic combined systolic (EF 37%) and grade 2diastolic congestive heart failure 10/28/2012  . Obstipation 10/28/2012  . GI bleed 10/27/2012  . Mass in rectum 10/27/2012  . DM (diabetes mellitus) type I controlled with renal manifestation 09/08/2012  . LVH (left ventricular hypertrophy)-severe concentric 06/13/2012  . Anemia due to chronic illness 06/12/2012  . Hyperlipidemia 01/30/2011  . CHOLELITHIASIS 08/01/2010  . BENIGN PROSTATIC HYPERTROPHY 08/01/2010  . CEREBROVASCULAR ACCIDENT, HX OF  08/06/2009  . Depression 03/18/2009  . Acute combined systolic and diastolic heart failure, NYHA class 2-EF 45% 03/18/2009  . NEPHROLITHIASIS, HX OF 03/18/2009  . Morbid obesity 03/25/2007  . Essential hypertension 03/25/2007  . GERD 03/25/2007  . HEPATITIS C, HX OF 03/25/2007    Jamey Reas PT, DPT 12/05/2014, 12:27 PM  [facility] [address] [place], [place], [address] Phone: [phone]   Fax:  [phone]

## 2014-12-06 DIAGNOSIS — N2581 Secondary hyperparathyroidism of renal origin: Secondary | ICD-10-CM | POA: Diagnosis not present

## 2014-12-06 DIAGNOSIS — D631 Anemia in chronic kidney disease: Secondary | ICD-10-CM | POA: Diagnosis not present

## 2014-12-06 DIAGNOSIS — N186 End stage renal disease: Secondary | ICD-10-CM | POA: Diagnosis not present

## 2014-12-08 DIAGNOSIS — N186 End stage renal disease: Secondary | ICD-10-CM | POA: Diagnosis not present

## 2014-12-08 DIAGNOSIS — N2581 Secondary hyperparathyroidism of renal origin: Secondary | ICD-10-CM | POA: Diagnosis not present

## 2014-12-08 DIAGNOSIS — D631 Anemia in chronic kidney disease: Secondary | ICD-10-CM | POA: Diagnosis not present

## 2014-12-10 ENCOUNTER — Encounter: Payer: Self-pay | Admitting: Physical Therapy

## 2014-12-11 DIAGNOSIS — N186 End stage renal disease: Secondary | ICD-10-CM | POA: Diagnosis not present

## 2014-12-11 DIAGNOSIS — N2581 Secondary hyperparathyroidism of renal origin: Secondary | ICD-10-CM | POA: Diagnosis not present

## 2014-12-12 ENCOUNTER — Encounter: Payer: Self-pay | Admitting: Physical Therapy

## 2014-12-13 DIAGNOSIS — N186 End stage renal disease: Secondary | ICD-10-CM | POA: Diagnosis not present

## 2014-12-13 DIAGNOSIS — N2581 Secondary hyperparathyroidism of renal origin: Secondary | ICD-10-CM | POA: Diagnosis not present

## 2014-12-15 DIAGNOSIS — N186 End stage renal disease: Secondary | ICD-10-CM | POA: Diagnosis not present

## 2014-12-15 DIAGNOSIS — N2581 Secondary hyperparathyroidism of renal origin: Secondary | ICD-10-CM | POA: Diagnosis not present

## 2014-12-17 ENCOUNTER — Ambulatory Visit: Payer: Medicare Other | Admitting: Physical Therapy

## 2014-12-17 ENCOUNTER — Encounter: Payer: Self-pay | Admitting: Physical Therapy

## 2014-12-17 DIAGNOSIS — R2689 Other abnormalities of gait and mobility: Secondary | ICD-10-CM

## 2014-12-17 DIAGNOSIS — Z89512 Acquired absence of left leg below knee: Secondary | ICD-10-CM

## 2014-12-17 DIAGNOSIS — Z89511 Acquired absence of right leg below knee: Secondary | ICD-10-CM

## 2014-12-17 DIAGNOSIS — R531 Weakness: Secondary | ICD-10-CM | POA: Diagnosis not present

## 2014-12-17 DIAGNOSIS — Z7409 Other reduced mobility: Secondary | ICD-10-CM | POA: Diagnosis not present

## 2014-12-17 DIAGNOSIS — M24669 Ankylosis, unspecified knee: Secondary | ICD-10-CM

## 2014-12-17 DIAGNOSIS — R5381 Other malaise: Secondary | ICD-10-CM | POA: Diagnosis not present

## 2014-12-17 DIAGNOSIS — R269 Unspecified abnormalities of gait and mobility: Secondary | ICD-10-CM

## 2014-12-17 DIAGNOSIS — R4189 Other symptoms and signs involving cognitive functions and awareness: Secondary | ICD-10-CM | POA: Diagnosis not present

## 2014-12-17 DIAGNOSIS — R6889 Other general symptoms and signs: Secondary | ICD-10-CM

## 2014-12-17 NOTE — Therapy (Signed)
[facility] [address] [place], [place], [address] Phone: [phone]   Fax:  [phone]  Physical Therapy Treatment  Patient Details  Name: Oscar Castillo MRN: [id number] Date of Birth: [birth year] Referring Provider:  [name], MD  Encounter Date: 12/17/2014      PT End of Session - 12/17/14 1100    Visit Number 37  [id number]   Number of Visits 34   Date for PT Re-Evaluation 01/02/15   Authorization Type G code   PT Start Time 1103   PT Stop Time 1145   PT Time Calculation (min) 42 min   Equipment Utilized During Treatment Gait belt   Activity Tolerance Patient limited by fatigue;Patient limited by pain   Behavior During Therapy [facility] for tasks assessed/performed      Past Medical History  Diagnosis Date  . ESRD on hemodialysis 05/05/2007    ESRD due to DM/HTN. Started dialysis in November 2013.  HD TTS at [facility] on [address].  [name] BACK PAIN, LUMBAR, CHRONIC 08/06/2009  . BENIGN PROSTATIC HYPERTROPHY 08/01/2010  . CEREBROVASCULAR ACCIDENT, HX OF 08/06/2009  . CHOLELITHIASIS 08/01/2010  . CONGESTIVE HEART FAILURE 03/18/2009  . DEPRESSION 03/18/2009  . DIABETES MELLITUS, TYPE II 03/25/2007  . ERECTILE DYSFUNCTION 03/25/2007  . GERD 03/25/2007  . HEPATITIS C, HX OF 03/25/2007  . HYPERTENSION 03/25/2007  . Morbid obesity 03/25/2007  . NEPHROLITHIASIS, HX OF 03/18/2009  . Complication of anesthesia     wife states pt had trouble waking up with his last surgery in Nov., 2014    Past Surgical History  Procedure Laterality Date  . Nephrectomy      partial RR  . Av fistula placement  06/14/2012    Procedure: ARTERIOVENOUS (AV) FISTULA CREATION;  Surgeon: Angelia Mould, MD;  Location: [facility] OR;  Service: Vascular;  Laterality: Left;  Left basilic vein transposition with fistula.  . Tibia im nail insertion Left 09/09/2012    Procedure: INTRAMEDULLARY (IM) NAIL TIBIAL;  Surgeon: Johnny Bridge, MD;  Location: [facility];   Service: Orthopedics;  Laterality: Left;  left tibial nail and open reduction internal fixation left fibula fracture  . Orif fibula fracture Left 09/09/2012    Procedure: OPEN REDUCTION INTERNAL FIXATION (ORIF) FIBULA FRACTURE;  Surgeon: Johnny Bridge, MD;  Location: [facility];  Service: Orthopedics;  Laterality: Left;  . Colonoscopy N/A 10/28/2012    Procedure: COLONOSCOPY;  Surgeon: Jeryl Columbia, MD;  Location: [facility] ENDOSCOPY;  Service: Endoscopy;  Laterality: N/A;  . Esophagogastroduodenoscopy N/A 11/02/2012    Procedure: ESOPHAGOGASTRODUODENOSCOPY (EGD);  Surgeon: Cleotis Nipper, MD;  Location: [facility] ENDOSCOPY;  Service: Endoscopy;  Laterality: N/A;  . Colonoscopy N/A 11/02/2012    Procedure: COLONOSCOPY;  Surgeon: Cleotis Nipper, MD;  Location: [facility] ENDOSCOPY;  Service: Endoscopy;  Laterality: N/A;  . Colonoscopy N/A 11/03/2012    Procedure: COLONOSCOPY;  Surgeon: Cleotis Nipper, MD;  Location: [facility] ENDOSCOPY;  Service: Endoscopy;  Laterality: N/A;  . Givens capsule study N/A 11/04/2012    Procedure: GIVENS CAPSULE STUDY;  Surgeon: Cleotis Nipper, MD;  Location: [facility] ENDOSCOPY;  Service: Endoscopy;  Laterality: N/A;  . Enteroscopy N/A 11/08/2012    Procedure: ENTEROSCOPY;  Surgeon: Wonda Horner, MD;  Location: [facility] ENDOSCOPY;  Service: Endoscopy;  Laterality: N/A;  . Amputation Left 05/12/2013    Procedure: AMPUTATION RAY;  Surgeon: Newt Minion, MD;  Location: [place];  Service: Orthopedics;  Laterality: Left;  Left Foot 1st  Ray Amputation  . Eye surgery Left     to remove scar tissue  . Amputation Left 06/09/2013    Procedure: AMPUTATION BELOW KNEE;  Surgeon: Newt Minion, MD;  Location: [place];  Service: Orthopedics;  Laterality: Left;  Left Below Knee Amputation and removal proximal screws IM tibial nail  . Hardware removal Left 06/09/2013    Procedure: HARDWARE REMOVAL;  Surgeon: Newt Minion, MD;  Location: [place];  Service: Orthopedics;  Laterality: Left;  Left Below Knee Amputation  and Removal  proximal screws IM tibial nail  . Amputation Right 09/08/2013    Procedure: AMPUTATION BELOW KNEE;  Surgeon: Newt Minion, MD;  Location: [place];  Service: Orthopedics;  Laterality: Right;  Right Below Knee Amputation  . Amputation Right 10/11/2013    Procedure: AMPUTATION BELOW KNEE;  Surgeon: Newt Minion, MD;  Location: [facility];  Service: Orthopedics;  Laterality: Right;  Right Below Knee Amputation Revision    There were no vitals filed for this visit.  Visit Diagnosis:  Generalized weakness  Decreased range of knee movement, unspecified laterality  Decreased functional activity tolerance  Abnormality of gait  Balance problems  Status post bilateral below knee amputation      Subjective Assessment - 12/17/14 1100    Subjective No issues on vacation.    Currently in Pain? No/denies     Prosthetic Training: Patient reports soreness at proximal patella area on LLE with no skin changes but scar from previous issue. PT instructed with demo in use of baby oil knee & proximally to decrease friction from liner in sitting. Patient & wife verbalized understanding. Patient sit to stand w/c to RW with minA with verbal cues on technique. Stand to sit with SBA using UEs to control descent. Patient ambulated 38' & 15' with RW with modA (2 people for safety & wife following with w/c as he fatigues rapidly) with cues on posture and step length.  Therapeutic Exercise: Bil. Leg Press 40# 15 reps (PT attempted higher wt but not able to move wt), single leg press 25# 10 reps ea. LE Seated on 24" stool in parallel bars: core stabilization with manual resistance (ext, flex, laterally & rotation), forward lean with recovery, back lean with recovery, Propelling w/c using LEs with minA.                            PT Education - 12/17/14 1100    Education provided Yes   Education Details HEP review of seated trunk & UE exercises   Person(s) Educated Patient;Spouse   Methods  Explanation;Demonstration   Comprehension Verbalized understanding          PT Short Term Goals - 11/30/14 1022    PT SHORT TERM GOAL #1   Title donnes prostheses with minimal assist (Target Date: 10/02/14)   Baseline MET 10/31/14   Time 1   Period Months   Status Achieved   PT SHORT TERM GOAL #2   Title wife & patient verbalize adjusting wear schedule to maximize functional potential. (Target Date: 10/02/14)   Time 1   Period Months   Status Achieved   PT SHORT TERM GOAL #3   Title performs squat-pivot transfer w/c to level mat with minimal assist. (Target Date: 10/02/14)   Baseline MET 09/26/14   Time 1   Period Months   Status Achieved   PT SHORT TERM GOAL #4   Title Sit to/from stand w/c to  parallel bars with moderate assist (Target Date: 10/02/14)   Baseline MET 09/26/14   Time 1   Period Months   Status Achieved   PT SHORT TERM GOAL #5   Title patient & wife demonstrate initial HEP correctly. (Target Date: 10/02/14)   Baseline MET 09/21/14   Time 1   Period Months   Status Achieved   PT SHORT TERM GOAL #6   Title reports wear of prostheses daily >80% of awake hours. (Target Date: 11/02/14)   Baseline Partially MET Patient wears prostheses >80% of out of bed time which is ~60% of awake hours.   Time 30   Period Days   Status Partially Met   PT SHORT TERM GOAL #7   Title Squat-pivot transfer with supervision. (Target Date: 11/02/14)   Baseline Partially MET 10/31/14 Squat-pivot transfer with contact assist for safety without physical assist.   Time 30   Period Days   Status Partially Met   PT SHORT TERM GOAL #8   Title Sit to / from stand w/c to RW with moderate assist. (Target Date: 11/02/14)   Baseline MET 10/31/14   Time 30   Period Days   Status Achieved   PT SHORT TERM GOAL #9   TITLE stand-pivot transfer including sit to/from stand with rolling walker and prostheses with modA. (Target Date:11/30/2014)   Time 4   Period Weeks   Status On-going   PT SHORT TERM GOAL #10   TITLE  ambulates 20' with rolling walker and prostheses with moderate assist. (Target Date:11/30/2014)   Baseline 11/30/14- distance goal met, however needed assist mod assist of 2 people   Time 4   Period Weeks   Status Partially Met   PT SHORT TERM GOAL #11   TITLE reports wear >80% of out of bed awake hours except dialysis without skin issues or pain. (Target Date:11/30/2014)   Baseline 11/30/14: wearing ~80% of awake hours or more, including to/from and during dialysis   Time 4   Period Weeks   Status Achieved           PT Long Term Goals - 10/31/14 1145    PT LONG TERM GOAL #1   Title Patient and wife demonstrate proper prosthetic care. (Target Date: 01/02/15)   Time 4   Period Months   Status New   PT LONG TERM GOAL #2   Title tolerates wear of prostheses >90% of awake hours except at dialysis (Target Date: 01/02/15)   Time 4   Period Months   Status On-going   PT LONG TERM GOAL #3   Title stand pivot transfers with rolling walker & prostheses with supervision.  (Target Date: 01/02/15)   Time 4   Period Months   Status Revised   PT LONG TERM GOAL #4   Title manages clothes, reaches 5" and in upper /lower cabinets in standing with UE support with minimal assist.  (Target Date: 01/02/15)   Time 4   Period Months   Status Revised   PT LONG TERM GOAL #5   Title ambulate 40' with rolling walker & prostheses with minimal assist.  (Target Date: 01/02/15)   Time 4   Period Months   Status Revised               Plan - 12/17/14 1100    Clinical Impression Statement Patient required less assist to stand and ambulate but still fatigues quickly limiting time & distances.    Pt will benefit from skilled therapeutic intervention in  order to improve on the following deficits Abnormal gait;Decreased activity tolerance;Decreased balance;Decreased endurance;Decreased knowledge of use of DME;Decreased mobility;Decreased range of motion;Decreased strength;Difficulty walking;Other (comment)   prosthetic dependency   Rehab Potential Good   Clinical Impairments Affecting Rehab Potential multiple medical issues over last year causing decondtioning   PT Frequency 2x / week   PT Duration Other (comment)  4 months   PT Treatment/Interventions ADLs/Self Care Home Management;Gait training;Stair training;DME Instruction;Functional mobility training;Therapeutic activities;Therapeutic exercise;Balance training;Neuromuscular re-education;Patient/family education;Manual techniques;Other (comment)  prosthetic training   PT Next Visit Plan Continue on strengthening, balance, and standing/gait with walker   Consulted and Agree with Plan of Care Patient;Family member/caregiver   Family Member Consulted wife        Problem List Patient Active Problem List   Diagnosis Date Noted  . Diarrhea 08/14/2014  . Dehydration 10/02/2013  . Fever 10/02/2013  . Altered mental status 10/01/2013  . Encephalopathy, toxic 10/01/2013  . Encephalopathy, metabolic 50/56/9794  . CVA (cerebral vascular accident) 09/28/2013  . FTT (failure to thrive) in adult 09/28/2013  . Acute confusional state 09/28/2013  . Ulcer of sacral region, stage 3 09/26/2013  . S/P BKA (below knee amputation) bilateral 09/08/2013  . ESRD on hemodialysis 05/09/2013  . TIA (transient ischemic attack) 02/20/2013  . Acute blood loss anemia 10/28/2012  . Chronic combined systolic (EF 80%) and grade 2diastolic congestive heart failure 10/28/2012  . Obstipation 10/28/2012  . GI bleed 10/27/2012  . Mass in rectum 10/27/2012  . DM (diabetes mellitus) type I controlled with renal manifestation 09/08/2012  . LVH (left ventricular hypertrophy)-severe concentric 06/13/2012  . Anemia due to chronic illness 06/12/2012  . Hyperlipidemia 01/30/2011  . CHOLELITHIASIS 08/01/2010  . BENIGN PROSTATIC HYPERTROPHY 08/01/2010  . CEREBROVASCULAR ACCIDENT, HX OF 08/06/2009  . Depression 03/18/2009  . Acute combined systolic and diastolic heart  failure, NYHA class 2-EF 45% 03/18/2009  . NEPHROLITHIASIS, HX OF 03/18/2009  . Morbid obesity 03/25/2007  . Essential hypertension 03/25/2007  . GERD 03/25/2007  . HEPATITIS C, HX OF 03/25/2007    Jamey Reas PT, DPT 12/17/2014, 12:25 PM  [facility] [address] [place], [place], [address] Phone: [phone]   Fax:  [phone]

## 2014-12-18 DIAGNOSIS — N186 End stage renal disease: Secondary | ICD-10-CM | POA: Diagnosis not present

## 2014-12-18 DIAGNOSIS — D631 Anemia in chronic kidney disease: Secondary | ICD-10-CM | POA: Diagnosis not present

## 2014-12-18 DIAGNOSIS — N2581 Secondary hyperparathyroidism of renal origin: Secondary | ICD-10-CM | POA: Diagnosis not present

## 2014-12-19 ENCOUNTER — Ambulatory Visit: Payer: Medicare Other | Admitting: Physical Therapy

## 2014-12-20 DIAGNOSIS — N186 End stage renal disease: Secondary | ICD-10-CM | POA: Diagnosis not present

## 2014-12-20 DIAGNOSIS — D631 Anemia in chronic kidney disease: Secondary | ICD-10-CM | POA: Diagnosis not present

## 2014-12-20 DIAGNOSIS — N2581 Secondary hyperparathyroidism of renal origin: Secondary | ICD-10-CM | POA: Diagnosis not present

## 2014-12-22 DIAGNOSIS — D631 Anemia in chronic kidney disease: Secondary | ICD-10-CM | POA: Diagnosis not present

## 2014-12-22 DIAGNOSIS — N186 End stage renal disease: Secondary | ICD-10-CM | POA: Diagnosis not present

## 2014-12-22 DIAGNOSIS — N2581 Secondary hyperparathyroidism of renal origin: Secondary | ICD-10-CM | POA: Diagnosis not present

## 2014-12-25 ENCOUNTER — Telehealth: Payer: Self-pay | Admitting: Internal Medicine

## 2014-12-25 DIAGNOSIS — Z992 Dependence on renal dialysis: Secondary | ICD-10-CM | POA: Diagnosis not present

## 2014-12-25 DIAGNOSIS — N186 End stage renal disease: Secondary | ICD-10-CM | POA: Diagnosis not present

## 2014-12-25 DIAGNOSIS — D631 Anemia in chronic kidney disease: Secondary | ICD-10-CM | POA: Diagnosis not present

## 2014-12-25 DIAGNOSIS — R197 Diarrhea, unspecified: Secondary | ICD-10-CM

## 2014-12-25 DIAGNOSIS — N2581 Secondary hyperparathyroidism of renal origin: Secondary | ICD-10-CM | POA: Diagnosis not present

## 2014-12-25 DIAGNOSIS — E1122 Type 2 diabetes mellitus with diabetic chronic kidney disease: Secondary | ICD-10-CM | POA: Diagnosis not present

## 2014-12-25 NOTE — Telephone Encounter (Signed)
Patient has been having severe diarrhea since December and Dr. [name] referred him to a gastro but they need to pay off their balance before they will see him. Patient is wanting another referral to a different gastro.

## 2014-12-25 NOTE — Telephone Encounter (Signed)
Pt's wife advised that previous GI records may be needed prior to appointment

## 2014-12-26 ENCOUNTER — Encounter: Payer: Self-pay | Admitting: Physical Therapy

## 2014-12-26 ENCOUNTER — Ambulatory Visit: Payer: Medicare Other | Attending: Internal Medicine | Admitting: Physical Therapy

## 2014-12-26 DIAGNOSIS — R269 Unspecified abnormalities of gait and mobility: Secondary | ICD-10-CM | POA: Diagnosis not present

## 2014-12-26 DIAGNOSIS — R2689 Other abnormalities of gait and mobility: Secondary | ICD-10-CM

## 2014-12-26 DIAGNOSIS — R6889 Other general symptoms and signs: Secondary | ICD-10-CM | POA: Insufficient documentation

## 2014-12-26 DIAGNOSIS — M24669 Ankylosis, unspecified knee: Secondary | ICD-10-CM

## 2014-12-26 DIAGNOSIS — Z89512 Acquired absence of left leg below knee: Secondary | ICD-10-CM | POA: Insufficient documentation

## 2014-12-26 DIAGNOSIS — R29818 Other symptoms and signs involving the nervous system: Secondary | ICD-10-CM | POA: Diagnosis not present

## 2014-12-26 DIAGNOSIS — R531 Weakness: Secondary | ICD-10-CM | POA: Diagnosis not present

## 2014-12-26 DIAGNOSIS — Z89511 Acquired absence of right leg below knee: Secondary | ICD-10-CM | POA: Insufficient documentation

## 2014-12-26 NOTE — Therapy (Signed)
[facility] [address] [place], [place], [address] Phone: [phone]   Fax:  [phone]  Physical Therapy Treatment  Patient Details  Name: Oscar Castillo MRN: [id number] Date of Birth: 06/06/1951 Referring Provider:  [name], MD  Encounter Date: 12/26/2014      PT End of Session - 12/26/14 1100    Visit Number 29  G9   Number of Visits 34   Date for PT Re-Evaluation 01/02/15   Authorization Type G code   PT Start Time 1107   PT Stop Time 1145   PT Time Calculation (min) 38 min   Equipment Utilized During Treatment Gait belt   Activity Tolerance Patient limited by fatigue;Patient limited by pain   Behavior During Therapy [facility] for tasks assessed/performed      Past Medical History  Diagnosis Date  . ESRD on hemodialysis 05/05/2007    ESRD due to DM/HTN. Started dialysis in November 2013.  HD TTS at [facility] on [address].  [name] BACK PAIN, LUMBAR, CHRONIC 08/06/2009  . BENIGN PROSTATIC HYPERTROPHY 08/01/2010  . CEREBROVASCULAR ACCIDENT, HX OF 08/06/2009  . CHOLELITHIASIS 08/01/2010  . CONGESTIVE HEART FAILURE 03/18/2009  . DEPRESSION 03/18/2009  . DIABETES MELLITUS, TYPE II 03/25/2007  . ERECTILE DYSFUNCTION 03/25/2007  . GERD 03/25/2007  . HEPATITIS C, HX OF 03/25/2007  . HYPERTENSION 03/25/2007  . Morbid obesity 03/25/2007  . NEPHROLITHIASIS, HX OF 03/18/2009  . Complication of anesthesia     wife states pt had trouble waking up with his last surgery in Nov., 2014    Past Surgical History  Procedure Laterality Date  . Nephrectomy      partial RR  . Av fistula placement  06/14/2012    Procedure: ARTERIOVENOUS (AV) FISTULA CREATION;  Surgeon: Angelia Mould, MD;  Location: [facility] OR;  Service: Vascular;  Laterality: Left;  Left basilic vein transposition with fistula.  . Tibia im nail insertion Left 09/09/2012    Procedure: INTRAMEDULLARY (IM) NAIL TIBIAL;  Surgeon: Johnny Bridge, MD;  Location: [place];   Service: Orthopedics;  Laterality: Left;  left tibial nail and open reduction internal fixation left fibula fracture  . Orif fibula fracture Left 09/09/2012    Procedure: OPEN REDUCTION INTERNAL FIXATION (ORIF) FIBULA FRACTURE;  Surgeon: Johnny Bridge, MD;  Location: [place];  Service: Orthopedics;  Laterality: Left;  . Colonoscopy N/A 10/28/2012    Procedure: COLONOSCOPY;  Surgeon: Jeryl Columbia, MD;  Location: [facility] ENDOSCOPY;  Service: Endoscopy;  Laterality: N/A;  . Esophagogastroduodenoscopy N/A 11/02/2012    Procedure: ESOPHAGOGASTRODUODENOSCOPY (EGD);  Surgeon: Cleotis Nipper, MD;  Location: [facility] ENDOSCOPY;  Service: Endoscopy;  Laterality: N/A;  . Colonoscopy N/A 11/02/2012    Procedure: COLONOSCOPY;  Surgeon: Cleotis Nipper, MD;  Location: [facility] ENDOSCOPY;  Service: Endoscopy;  Laterality: N/A;  . Colonoscopy N/A 11/03/2012    Procedure: COLONOSCOPY;  Surgeon: Cleotis Nipper, MD;  Location: [facility] ENDOSCOPY;  Service: Endoscopy;  Laterality: N/A;  . Givens capsule study N/A 11/04/2012    Procedure: GIVENS CAPSULE STUDY;  Surgeon: Cleotis Nipper, MD;  Location: [facility] ENDOSCOPY;  Service: Endoscopy;  Laterality: N/A;  . Enteroscopy N/A 11/08/2012    Procedure: ENTEROSCOPY;  Surgeon: Wonda Horner, MD;  Location: [facility] ENDOSCOPY;  Service: Endoscopy;  Laterality: N/A;  . Amputation Left 05/12/2013    Procedure: AMPUTATION RAY;  Surgeon: Newt Minion, MD;  Location: [place];  Service: Orthopedics;  Laterality: Left;  Left Foot 1st  Ray Amputation  . Eye surgery Left     to remove scar tissue  . Amputation Left 06/09/2013    Procedure: AMPUTATION BELOW KNEE;  Surgeon: Newt Minion, MD;  Location: [place];  Service: Orthopedics;  Laterality: Left;  Left Below Knee Amputation and removal proximal screws IM tibial nail  . Hardware removal Left 06/09/2013    Procedure: HARDWARE REMOVAL;  Surgeon: Newt Minion, MD;  Location: [place];  Service: Orthopedics;  Laterality: Left;  Left Below Knee Amputation  and Removal  proximal screws IM tibial nail  . Amputation Right 09/08/2013    Procedure: AMPUTATION BELOW KNEE;  Surgeon: Newt Minion, MD;  Location: [place];  Service: Orthopedics;  Laterality: Right;  Right Below Knee Amputation  . Amputation Right 10/11/2013    Procedure: AMPUTATION BELOW KNEE;  Surgeon: Newt Minion, MD;  Location: [place];  Service: Orthopedics;  Laterality: Right;  Right Below Knee Amputation Revision    There were no vitals filed for this visit.  Visit Diagnosis:  Generalized weakness  Decreased range of knee movement, unspecified laterality  Decreased functional activity tolerance  Abnormality of gait  Balance problems  Status post bilateral below knee amputation      Subjective Assessment - 12/26/14 1108    Subjective He has been sick over last week but better now. He continues to wear prostheses daily all awake hours.   Currently in Pain? No/denies     Prosthetic Training: PT checked prostheses: both donned correctly. PT reviewed adjusting ply socks. Patient reports wear most of awake hours which improves ability to utilize / function with prostheses and decreases risk of falling out of w/c. Pt and wife verbalized understanding. Sit to stand w/c to RW pushing on armrests with minA first time and modA second time. Stand to sit uncontrolled as not able to reach back to w/c with UEs. Patient ambulated 33' with RW / prostheses with modA (2nd person for safety only) with wife following w/c as he fatigues quickly with verbal cues on posture & hip/knee extension in stance. PT reviewed LTGs and need for functional progress to continue.  Therapeutic Exercise: Seated in w/c exercises with wt system Horizontal chest press 20# 10 reps, overhead chest pull 20# 10 reps. W/c push-up with UEs on wheels not armrests to work on terminal elbow extension strength- 5 reps. PT recommended looking into [facility] for fitness option with chair Yoga, exercise room, etc. Patient &  wife agreed.                            PT Education - 12/26/14 1100    Education provided Yes   Education Details benefits of ongoing fitness program, [employer] as option for fitness, w/c push-up   Person(s) Educated Patient;Spouse   Methods Explanation;Verbal cues   Comprehension Verbalized understanding;Returned demonstration;Verbal cues required;Need further instruction          PT Short Term Goals - 11/30/14 1022    PT SHORT TERM GOAL #1   Title donnes prostheses with minimal assist (Target Date: 10/02/14)   Baseline MET 10/31/14   Time 1   Period Months   Status Achieved   PT SHORT TERM GOAL #2   Title wife & patient verbalize adjusting wear schedule to maximize functional potential. (Target Date: 10/02/14)   Time 1   Period Months   Status Achieved   PT SHORT TERM GOAL #3   Title  performs squat-pivot transfer w/c to level mat with minimal assist. (Target Date: 10/02/14)   Baseline MET 09/26/14   Time 1   Period Months   Status Achieved   PT SHORT TERM GOAL #4   Title Sit to/from stand w/c to parallel bars with moderate assist (Target Date: 10/02/14)   Baseline MET 09/26/14   Time 1   Period Months   Status Achieved   PT SHORT TERM GOAL #5   Title patient & wife demonstrate initial HEP correctly. (Target Date: 10/02/14)   Baseline MET 09/21/14   Time 1   Period Months   Status Achieved   PT SHORT TERM GOAL #6   Title reports wear of prostheses daily >80% of awake hours. (Target Date: 11/02/14)   Baseline Partially MET Patient wears prostheses >80% of out of bed time which is ~60% of awake hours.   Time 30   Period Days   Status Partially Met   PT SHORT TERM GOAL #7   Title Squat-pivot transfer with supervision. (Target Date: 11/02/14)   Baseline Partially MET 10/31/14 Squat-pivot transfer with contact assist for safety without physical assist.   Time 30   Period Days   Status Partially Met   PT SHORT TERM GOAL #8   Title Sit to / from stand w/c  to RW with moderate assist. (Target Date: 11/02/14)   Baseline MET 10/31/14   Time 30   Period Days   Status Achieved   PT SHORT TERM GOAL #9   TITLE stand-pivot transfer including sit to/from stand with rolling walker and prostheses with modA. (Target Date:11/30/2014)   Time 4   Period Weeks   Status On-going   PT SHORT TERM GOAL #10   TITLE ambulates 20' with rolling walker and prostheses with moderate assist. (Target Date:11/30/2014)   Baseline 11/30/14- distance goal met, however needed assist mod assist of 2 people   Time 4   Period Weeks   Status Partially Met   PT SHORT TERM GOAL #11   TITLE reports wear >80% of out of bed awake hours except dialysis without skin issues or pain. (Target Date:11/30/2014)   Baseline 11/30/14: wearing ~80% of awake hours or more, including to/from and during dialysis   Time 4   Period Weeks   Status Achieved           PT Long Term Goals - 10/31/14 1145    PT LONG TERM GOAL #1   Title Patient and wife demonstrate proper prosthetic care. (Target Date: 01/02/15)   Time 4   Period Months   Status New   PT LONG TERM GOAL #2   Title tolerates wear of prostheses >90% of awake hours except at dialysis (Target Date: 01/02/15)   Time 4   Period Months   Status On-going   PT LONG TERM GOAL #3   Title stand pivot transfers with rolling walker & prostheses with supervision.  (Target Date: 01/02/15)   Time 4   Period Months   Status Revised   PT LONG TERM GOAL #4   Title manages clothes, reaches 5" and in upper /lower cabinets in standing with UE support with minimal assist.  (Target Date: 01/02/15)   Time 4   Period Months   Status Revised   PT LONG TERM GOAL #5   Title ambulate 60' with rolling walker & prostheses with minimal assist.  (Target Date: 01/02/15)   Time 4   Period Months   Status Revised  Plan - 12/26/14 1100    Clinical Impression Statement Patient and wife appear to understand fitness benefits and need for regular exercise  to progress activity tolerance.    Pt will benefit from skilled therapeutic intervention in order to improve on the following deficits Abnormal gait;Decreased activity tolerance;Decreased balance;Decreased endurance;Decreased knowledge of use of DME;Decreased mobility;Decreased range of motion;Decreased strength;Difficulty walking;Other (comment)  prosthetic dependency   Rehab Potential Good   Clinical Impairments Affecting Rehab Potential multiple medical issues over last year causing decondtioning   PT Frequency 2x / week   PT Duration Other (comment)  4 months   PT Treatment/Interventions ADLs/Self Care Home Management;Gait training;Stair training;DME Instruction;Functional mobility training;Therapeutic activities;Therapeutic exercise;Balance training;Neuromuscular re-education;Patient/family education;Manual techniques;Other (comment)  prosthetic training   PT Next Visit Plan Continue on strengthening, balance, and standing/gait with walker   Consulted and Agree with Plan of Care Patient;Family member/caregiver   Family Member Consulted wife        Problem List Patient Active Problem List   Diagnosis Date Noted  . Diarrhea 08/14/2014  . Dehydration 10/02/2013  . Fever 10/02/2013  . Altered mental status 10/01/2013  . Encephalopathy, toxic 10/01/2013  . Encephalopathy, metabolic 93/79/0240  . CVA (cerebral vascular accident) 09/28/2013  . FTT (failure to thrive) in adult 09/28/2013  . Acute confusional state 09/28/2013  . Ulcer of sacral region, stage 3 09/26/2013  . S/P BKA (below knee amputation) bilateral 09/08/2013  . ESRD on hemodialysis 05/09/2013  . TIA (transient ischemic attack) 02/20/2013  . Acute blood loss anemia 10/28/2012  . Chronic combined systolic (EF 97%) and grade 2diastolic congestive heart failure 10/28/2012  . Obstipation 10/28/2012  . GI bleed 10/27/2012  . Mass in rectum 10/27/2012  . DM (diabetes mellitus) type I controlled with renal manifestation  09/08/2012  . LVH (left ventricular hypertrophy)-severe concentric 06/13/2012  . Anemia due to chronic illness 06/12/2012  . Hyperlipidemia 01/30/2011  . CHOLELITHIASIS 08/01/2010  . BENIGN PROSTATIC HYPERTROPHY 08/01/2010  . CEREBROVASCULAR ACCIDENT, HX OF 08/06/2009  . Depression 03/18/2009  . Acute combined systolic and diastolic heart failure, NYHA class 2-EF 45% 03/18/2009  . NEPHROLITHIASIS, HX OF 03/18/2009  . Morbid obesity 03/25/2007  . Essential hypertension 03/25/2007  . GERD 03/25/2007  . HEPATITIS C, HX OF 03/25/2007    Jamey Reas PT, DPT 12/26/2014, 2:13 PM  [facility] [address] [place], [place], [address] Phone: [phone]   Fax:  [phone]

## 2014-12-27 DIAGNOSIS — D509 Iron deficiency anemia, unspecified: Secondary | ICD-10-CM | POA: Diagnosis not present

## 2014-12-27 DIAGNOSIS — N186 End stage renal disease: Secondary | ICD-10-CM | POA: Diagnosis not present

## 2014-12-27 DIAGNOSIS — E1122 Type 2 diabetes mellitus with diabetic chronic kidney disease: Secondary | ICD-10-CM | POA: Diagnosis not present

## 2014-12-27 DIAGNOSIS — N2581 Secondary hyperparathyroidism of renal origin: Secondary | ICD-10-CM | POA: Diagnosis not present

## 2014-12-27 DIAGNOSIS — D631 Anemia in chronic kidney disease: Secondary | ICD-10-CM | POA: Diagnosis not present

## 2014-12-27 NOTE — Telephone Encounter (Signed)
It looks like his prior records are already in his chart. We would need a new referral though.

## 2014-12-28 ENCOUNTER — Ambulatory Visit: Payer: Medicare Other | Admitting: Physical Therapy

## 2014-12-28 ENCOUNTER — Encounter: Payer: Self-pay | Admitting: Physical Therapy

## 2014-12-28 DIAGNOSIS — R531 Weakness: Secondary | ICD-10-CM | POA: Diagnosis not present

## 2014-12-28 DIAGNOSIS — R29818 Other symptoms and signs involving the nervous system: Secondary | ICD-10-CM | POA: Diagnosis not present

## 2014-12-28 DIAGNOSIS — R6889 Other general symptoms and signs: Secondary | ICD-10-CM | POA: Diagnosis not present

## 2014-12-28 DIAGNOSIS — R269 Unspecified abnormalities of gait and mobility: Secondary | ICD-10-CM

## 2014-12-28 DIAGNOSIS — Z89512 Acquired absence of left leg below knee: Secondary | ICD-10-CM | POA: Diagnosis not present

## 2014-12-28 DIAGNOSIS — M24669 Ankylosis, unspecified knee: Secondary | ICD-10-CM | POA: Diagnosis not present

## 2014-12-28 NOTE — Telephone Encounter (Signed)
Referral done

## 2014-12-28 NOTE — Therapy (Signed)
[facility] [address] [place], [place], [address] Phone: [phone]   Fax:  [phone]  Physical Therapy Treatment  Patient Details  Name: Oscar Castillo MRN: [id number] Date of Birth: February 18, 1951 Referring Provider:  [name], MD  Encounter Date: 12/28/2014      PT End of Session - 12/28/14 1109    Visit Number 30  G9   Number of Visits 34   Date for PT Re-Evaluation 01/02/15   Authorization Type G code   PT Start Time 1105   PT Stop Time 1145   PT Time Calculation (min) 40 min   Equipment Utilized During Treatment Gait belt   Activity Tolerance Patient limited by fatigue;Patient limited by pain   Behavior During Therapy [facility] for tasks assessed/performed      Past Medical History  Diagnosis Date  . ESRD on hemodialysis 05/05/2007    ESRD due to DM/HTN. Started dialysis in November 2013.  HD TTS at [facility] on [place].  [name] BACK PAIN, LUMBAR, CHRONIC 08/06/2009  . BENIGN PROSTATIC HYPERTROPHY 08/01/2010  . CEREBROVASCULAR ACCIDENT, HX OF 08/06/2009  . CHOLELITHIASIS 08/01/2010  . CONGESTIVE HEART FAILURE 03/18/2009  . DEPRESSION 03/18/2009  . DIABETES MELLITUS, TYPE II 03/25/2007  . ERECTILE DYSFUNCTION 03/25/2007  . GERD 03/25/2007  . HEPATITIS C, HX OF 03/25/2007  . HYPERTENSION 03/25/2007  . Morbid obesity 03/25/2007  . NEPHROLITHIASIS, HX OF 03/18/2009  . Complication of anesthesia     wife states pt had trouble waking up with his last surgery in Nov., 2014    Past Surgical History  Procedure Laterality Date  . Nephrectomy      partial RR  . Av fistula placement  06/14/2012    Procedure: ARTERIOVENOUS (AV) FISTULA CREATION;  Surgeon: Angelia Mould, MD;  Location: [facility];  Service: Vascular;  Laterality: Left;  Left basilic vein transposition with fistula.  . Tibia im nail insertion Left 09/09/2012    Procedure: INTRAMEDULLARY (IM) NAIL TIBIAL;  Surgeon: Johnny Bridge, MD;  Location: [place];   Service: Orthopedics;  Laterality: Left;  left tibial nail and open reduction internal fixation left fibula fracture  . Orif fibula fracture Left 09/09/2012    Procedure: OPEN REDUCTION INTERNAL FIXATION (ORIF) FIBULA FRACTURE;  Surgeon: Johnny Bridge, MD;  Location: [place];  Service: Orthopedics;  Laterality: Left;  . Colonoscopy N/A 10/28/2012    Procedure: COLONOSCOPY;  Surgeon: Jeryl Columbia, MD;  Location: [facility] ENDOSCOPY;  Service: Endoscopy;  Laterality: N/A;  . Esophagogastroduodenoscopy N/A 11/02/2012    Procedure: ESOPHAGOGASTRODUODENOSCOPY (EGD);  Surgeon: Cleotis Nipper, MD;  Location: [facility] ENDOSCOPY;  Service: Endoscopy;  Laterality: N/A;  . Colonoscopy N/A 11/02/2012    Procedure: COLONOSCOPY;  Surgeon: Cleotis Nipper, MD;  Location: [facility] ENDOSCOPY;  Service: Endoscopy;  Laterality: N/A;  . Colonoscopy N/A 11/03/2012    Procedure: COLONOSCOPY;  Surgeon: Cleotis Nipper, MD;  Location: [facility] ENDOSCOPY;  Service: Endoscopy;  Laterality: N/A;  . Givens capsule study N/A 11/04/2012    Procedure: GIVENS CAPSULE STUDY;  Surgeon: Cleotis Nipper, MD;  Location: [facility] ENDOSCOPY;  Service: Endoscopy;  Laterality: N/A;  . Enteroscopy N/A 11/08/2012    Procedure: ENTEROSCOPY;  Surgeon: Wonda Horner, MD;  Location: [facility] ENDOSCOPY;  Service: Endoscopy;  Laterality: N/A;  . Amputation Left 05/12/2013    Procedure: AMPUTATION RAY;  Surgeon: Newt Minion, MD;  Location: [facility];  Service: Orthopedics;  Laterality: Left;  Left Foot 1st  Ray Amputation  . Eye surgery Left     to remove scar tissue  . Amputation Left 06/09/2013    Procedure: AMPUTATION BELOW KNEE;  Surgeon: Newt Minion, MD;  Location: [place];  Service: Orthopedics;  Laterality: Left;  Left Below Knee Amputation and removal proximal screws IM tibial nail  . Hardware removal Left 06/09/2013    Procedure: HARDWARE REMOVAL;  Surgeon: Newt Minion, MD;  Location: [place];  Service: Orthopedics;  Laterality: Left;  Left Below Knee Amputation  and Removal  proximal screws IM tibial nail  . Amputation Right 09/08/2013    Procedure: AMPUTATION BELOW KNEE;  Surgeon: Newt Minion, MD;  Location: [place];  Service: Orthopedics;  Laterality: Right;  Right Below Knee Amputation  . Amputation Right 10/11/2013    Procedure: AMPUTATION BELOW KNEE;  Surgeon: Newt Minion, MD;  Location: [place];  Service: Orthopedics;  Laterality: Right;  Right Below Knee Amputation Revision    There were no vitals filed for this visit.  Visit Diagnosis:  Generalized weakness  Abnormality of gait  Decreased range of knee movement, unspecified laterality  Decreased functional activity tolerance      Subjective Assessment - 12/28/14 1108    Subjective Reports his legs feel tired and that his left arm hurts from Dialysis yesteday (took them 10 tries to access for dialysis). No falls.    Currently in Pain? No/denies   Pain Score 0-No pain          [facility] Adult PT Treatment/Exercise - 12/28/14 1113    Transfers   Sit to Stand 3: Mod assist;4: Min assist;With armrests;From chair/3-in-1;With upper extremity assist  2 person assist w/c to walker   Stand to Sit 3: Mod assist;With upper extremity assist;To chair/3-in-1;With armrests  2cd person for safety   Ambulation/Gait   Ambulation/Gait Yes   Ambulation/Gait Assistance 3: Mod assist;Other (comment)  2cd person with gait, spouse for wheelchair follow   Ambulation/Gait Assistance Details multimodal cues for posture, knee extension with stance and step length with gait. assist for walker management needed.    Ambulation Distance (Feet) 20 Feet  x1, 10 feet x1   Assistive device Rolling walker;Prostheses   Gait Pattern Step-through pattern;Decreased stride length;Trunk flexed;Decreased step length - right;Decreased step length - left;Right flexed knee in stance;Left flexed knee in stance;Decreased hip/knee flexion - right;Decreased hip/knee flexion - left   Ambulation Surface Level;Indoor   Prosthetics   Current  prosthetic wear tolerance (days/week)  7 days   Current prosthetic wear tolerance (#hours/day)  90% of awake hours, including dialysis   Education Provided Residual limb care;Correct ply sock adjustment   Person(s) Educated Patient;Spouse   Education Method Explanation   Education Method Verbalized understanding   Donning Prosthesis Minimal assist   Doffing Prosthesis Minimal assist     Exercise: seated in wheelchair Passive prolonged stretching to bil hamstrings and hip adductors With yellow Thera-band - hamstring curls x 10 each leg - long arc quads x 10 each leg - hip abduction/ER X 10 reps         PT Short Term Goals - 11/30/14 1022    PT SHORT TERM GOAL #1   Title donnes prostheses with minimal assist (Target Date: 10/02/14)   Baseline MET 10/31/14   Time 1   Period Months   Status Achieved   PT SHORT TERM GOAL #2   Title wife & patient verbalize adjusting wear schedule to maximize functional potential. (Target Date: 10/02/14)   Time 1  Period Months   Status Achieved   PT SHORT TERM GOAL #3   Title performs squat-pivot transfer w/c to level mat with minimal assist. (Target Date: 10/02/14)   Baseline MET 09/26/14   Time 1   Period Months   Status Achieved   PT SHORT TERM GOAL #4   Title Sit to/from stand w/c to parallel bars with moderate assist (Target Date: 10/02/14)   Baseline MET 09/26/14   Time 1   Period Months   Status Achieved   PT SHORT TERM GOAL #5   Title patient & wife demonstrate initial HEP correctly. (Target Date: 10/02/14)   Baseline MET 09/21/14   Time 1   Period Months   Status Achieved   PT SHORT TERM GOAL #6   Title reports wear of prostheses daily >80% of awake hours. (Target Date: 11/02/14)   Baseline Partially MET Patient wears prostheses >80% of out of bed time which is ~60% of awake hours.   Time 30   Period Days   Status Partially Met   PT SHORT TERM GOAL #7   Title Squat-pivot transfer with supervision. (Target Date: 11/02/14)   Baseline  Partially MET 10/31/14 Squat-pivot transfer with contact assist for safety without physical assist.   Time 30   Period Days   Status Partially Met   PT SHORT TERM GOAL #8   Title Sit to / from stand w/c to RW with moderate assist. (Target Date: 11/02/14)   Baseline MET 10/31/14   Time 30   Period Days   Status Achieved   PT SHORT TERM GOAL #9   TITLE stand-pivot transfer including sit to/from stand with rolling walker and prostheses with modA. (Target Date:11/30/2014)   Time 4   Period Weeks   Status On-going   PT SHORT TERM GOAL #10   TITLE ambulates 20' with rolling walker and prostheses with moderate assist. (Target Date:11/30/2014)   Baseline 11/30/14- distance goal met, however needed assist mod assist of 2 people   Time 4   Period Weeks   Status Partially Met   PT SHORT TERM GOAL #11   TITLE reports wear >80% of out of bed awake hours except dialysis without skin issues or pain. (Target Date:11/30/2014)   Baseline 11/30/14: wearing ~80% of awake hours or more, including to/from and during dialysis   Time 4   Period Weeks   Status Achieved           PT Long Term Goals - 10/31/14 1145    PT LONG TERM GOAL #1   Title Patient and wife demonstrate proper prosthetic care. (Target Date: 01/02/15)   Time 4   Period Months   Status New   PT LONG TERM GOAL #2   Title tolerates wear of prostheses >90% of awake hours except at dialysis (Target Date: 01/02/15)   Time 4   Period Months   Status On-going   PT LONG TERM GOAL #3   Title stand pivot transfers with rolling walker & prostheses with supervision.  (Target Date: 01/02/15)   Time 4   Period Months   Status Revised   PT LONG TERM GOAL #4   Title manages clothes, reaches 5" and in upper /lower cabinets in standing with UE support with minimal assist.  (Target Date: 01/02/15)   Time 4   Period Months   Status Revised   PT LONG TERM GOAL #5   Title ambulate 11' with rolling walker & prostheses with minimal assist.  (Target Date: 01/02/15)  Time 4   Period Months   Status Revised           Plan - 2015-01-06 1109    Clinical Impression Statement Pt and wife have joined [facility] for [employer] and pt will be working with Mortimer Fries, a Clinical research associate there. Bobby plans to attend one visit next week to learn more of what therapy wants the pt to be working on. Will plan to wrap up therapy next week at the end of the pt's plan of care.                                Pt will benefit from skilled therapeutic intervention in order to improve on the following deficits Abnormal gait;Decreased activity tolerance;Decreased balance;Decreased endurance;Decreased knowledge of use of DME;Decreased mobility;Decreased range of motion;Decreased strength;Difficulty walking;Other (comment)  prosthetic dependency   Rehab Potential Good   Clinical Impairments Affecting Rehab Potential multiple medical issues over last year causing decondtioning   PT Frequency 2x / week   PT Duration Other (comment)  4 months   PT Treatment/Interventions ADLs/Self Care Home Management;Gait training;Stair training;DME Instruction;Functional mobility training;Therapeutic activities;Therapeutic exercise;Balance training;Neuromuscular re-education;Patient/family education;Manual techniques;Other (comment)  prosthetic training   PT Next Visit Plan assess LTG's   Consulted and Agree with Plan of Care Patient;Family member/caregiver   Family Member Consulted wife          G-Codes - 2015/01/06 1514    Functional Assessment Tool Used Wearing prostheses all awake hours, skin intact, independent with cleaning/care of prostheses. Does need cues on sock management.   Functional Limitation Self care   Self Care Current Status [phone]) At least 20 percent but less than 40 percent impaired, limited or restricted   Self Care Goal Status (R4270) At least 20 percent but less than 40 percent impaired, limited or restricted      Problem List Patient Active Problem List   Diagnosis  Date Noted  . Diarrhea 08/14/2014  . Dehydration 10/02/2013  . Fever 10/02/2013  . Altered mental status 10/01/2013  . Encephalopathy, toxic 10/01/2013  . Encephalopathy, metabolic 62/37/6283  . CVA (cerebral vascular accident) 09/28/2013  . FTT (failure to thrive) in adult 09/28/2013  . Acute confusional state 09/28/2013  . Ulcer of sacral region, stage 3 09/26/2013  . S/P BKA (below knee amputation) bilateral 09/08/2013  . ESRD on hemodialysis 05/09/2013  . TIA (transient ischemic attack) 02/20/2013  . Acute blood loss anemia 10/28/2012  . Chronic combined systolic (EF 15%) and grade 2diastolic congestive heart failure 10/28/2012  . Obstipation 10/28/2012  . GI bleed 10/27/2012  . Mass in rectum 10/27/2012  . DM (diabetes mellitus) type I controlled with renal manifestation 09/08/2012  . LVH (left ventricular hypertrophy)-severe concentric 06/13/2012  . Anemia due to chronic illness 06/12/2012  . Hyperlipidemia 01/30/2011  . CHOLELITHIASIS 08/01/2010  . BENIGN PROSTATIC HYPERTROPHY 08/01/2010  . CEREBROVASCULAR ACCIDENT, HX OF 08/06/2009  . Depression 03/18/2009  . Acute combined systolic and diastolic heart failure, NYHA class 2-EF 45% 03/18/2009  . NEPHROLITHIASIS, HX OF 03/18/2009  . Morbid obesity 03/25/2007  . Essential hypertension 03/25/2007  . GERD 03/25/2007  . HEPATITIS C, HX OF 03/25/2007    Miller,Jennifer L 06-Jan-2015, 3:16 PM  Willow Ora, PTA, [facility] [address] [place], [place] [address] [phone] 01/06/2015, 3:16 PM    G-code completed by PT. Geoffry Paradise, PT,DPT 01/06/15 3:16 PM Phone: [phone] Fax: [phone]

## 2014-12-29 DIAGNOSIS — N186 End stage renal disease: Secondary | ICD-10-CM | POA: Diagnosis not present

## 2014-12-29 DIAGNOSIS — N2581 Secondary hyperparathyroidism of renal origin: Secondary | ICD-10-CM | POA: Diagnosis not present

## 2014-12-29 DIAGNOSIS — D509 Iron deficiency anemia, unspecified: Secondary | ICD-10-CM | POA: Diagnosis not present

## 2014-12-29 DIAGNOSIS — D631 Anemia in chronic kidney disease: Secondary | ICD-10-CM | POA: Diagnosis not present

## 2014-12-29 DIAGNOSIS — E1122 Type 2 diabetes mellitus with diabetic chronic kidney disease: Secondary | ICD-10-CM | POA: Diagnosis not present

## 2014-12-31 ENCOUNTER — Encounter: Payer: Self-pay | Admitting: Physical Therapy

## 2014-12-31 ENCOUNTER — Ambulatory Visit: Payer: Medicare Other | Admitting: Physical Therapy

## 2014-12-31 DIAGNOSIS — Z89511 Acquired absence of right leg below knee: Secondary | ICD-10-CM

## 2014-12-31 DIAGNOSIS — R6889 Other general symptoms and signs: Secondary | ICD-10-CM | POA: Diagnosis not present

## 2014-12-31 DIAGNOSIS — R29818 Other symptoms and signs involving the nervous system: Secondary | ICD-10-CM | POA: Diagnosis not present

## 2014-12-31 DIAGNOSIS — R269 Unspecified abnormalities of gait and mobility: Secondary | ICD-10-CM

## 2014-12-31 DIAGNOSIS — Z89512 Acquired absence of left leg below knee: Secondary | ICD-10-CM | POA: Diagnosis not present

## 2014-12-31 DIAGNOSIS — M24669 Ankylosis, unspecified knee: Secondary | ICD-10-CM | POA: Diagnosis not present

## 2014-12-31 DIAGNOSIS — R2689 Other abnormalities of gait and mobility: Secondary | ICD-10-CM

## 2014-12-31 DIAGNOSIS — R531 Weakness: Secondary | ICD-10-CM | POA: Diagnosis not present

## 2014-12-31 NOTE — Therapy (Signed)
[facility] [address] [place], [place], [address] Phone: [phone]   Fax:  [phone]  Physical Therapy Treatment  Patient Details  Name: Oscar Castillo MRN: [id number] Date of Birth: 1950-09-28 Referring Provider:  [name], MD  Encounter Date: 12/31/2014      PT End of Session - 12/31/14 1100    Visit Number 31  G9   Number of Visits 34   Date for PT Re-Evaluation 01/02/15   Authorization Type G code   PT Start Time 1105   PT Stop Time 1146   PT Time Calculation (min) 41 min   Equipment Utilized During Treatment Gait belt   Activity Tolerance Patient limited by fatigue;Patient limited by pain   Behavior During Therapy [facility] for tasks assessed/performed      Past Medical History  Diagnosis Date  . ESRD on hemodialysis 05/05/2007    ESRD due to DM/HTN. Started dialysis in November 2013.  HD TTS at [facility] on [address].  [name] BACK PAIN, LUMBAR, CHRONIC 08/06/2009  . BENIGN PROSTATIC HYPERTROPHY 08/01/2010  . CEREBROVASCULAR ACCIDENT, HX OF 08/06/2009  . CHOLELITHIASIS 08/01/2010  . CONGESTIVE HEART FAILURE 03/18/2009  . DEPRESSION 03/18/2009  . DIABETES MELLITUS, TYPE II 03/25/2007  . ERECTILE DYSFUNCTION 03/25/2007  . GERD 03/25/2007  . HEPATITIS C, HX OF 03/25/2007  . HYPERTENSION 03/25/2007  . Morbid obesity 03/25/2007  . NEPHROLITHIASIS, HX OF 03/18/2009  . Complication of anesthesia     wife states pt had trouble waking up with his last surgery in Nov., 2014    Past Surgical History  Procedure Laterality Date  . Nephrectomy      partial RR  . Av fistula placement  06/14/2012    Procedure: ARTERIOVENOUS (AV) FISTULA CREATION;  Surgeon: Angelia Mould, MD;  Location: [facility] OR;  Service: Vascular;  Laterality: Left;  Left basilic vein transposition with fistula.  . Tibia im nail insertion Left 09/09/2012    Procedure: INTRAMEDULLARY (IM) NAIL TIBIAL;  Surgeon: Johnny Bridge, MD;  Location: [place];   Service: Orthopedics;  Laterality: Left;  left tibial nail and open reduction internal fixation left fibula fracture  . Orif fibula fracture Left 09/09/2012    Procedure: OPEN REDUCTION INTERNAL FIXATION (ORIF) FIBULA FRACTURE;  Surgeon: Johnny Bridge, MD;  Location: [place];  Service: Orthopedics;  Laterality: Left;  . Colonoscopy N/A 10/28/2012    Procedure: COLONOSCOPY;  Surgeon: Jeryl Columbia, MD;  Location: [facility] ENDOSCOPY;  Service: Endoscopy;  Laterality: N/A;  . Esophagogastroduodenoscopy N/A 11/02/2012    Procedure: ESOPHAGOGASTRODUODENOSCOPY (EGD);  Surgeon: Cleotis Nipper, MD;  Location: [facility] ENDOSCOPY;  Service: Endoscopy;  Laterality: N/A;  . Colonoscopy N/A 11/02/2012    Procedure: COLONOSCOPY;  Surgeon: Cleotis Nipper, MD;  Location: [facility] ENDOSCOPY;  Service: Endoscopy;  Laterality: N/A;  . Colonoscopy N/A 11/03/2012    Procedure: COLONOSCOPY;  Surgeon: Cleotis Nipper, MD;  Location: [facility] ENDOSCOPY;  Service: Endoscopy;  Laterality: N/A;  . Givens capsule study N/A 11/04/2012    Procedure: GIVENS CAPSULE STUDY;  Surgeon: Cleotis Nipper, MD;  Location: [facility] ENDOSCOPY;  Service: Endoscopy;  Laterality: N/A;  . Enteroscopy N/A 11/08/2012    Procedure: ENTEROSCOPY;  Surgeon: Wonda Horner, MD;  Location: [facility] ENDOSCOPY;  Service: Endoscopy;  Laterality: N/A;  . Amputation Left 05/12/2013    Procedure: AMPUTATION RAY;  Surgeon: Newt Minion, MD;  Location: [place];  Service: Orthopedics;  Laterality: Left;  Left Foot 1st  Ray Amputation  . Eye surgery Left     to remove scar tissue  . Amputation Left 06/09/2013    Procedure: AMPUTATION BELOW KNEE;  Surgeon: Newt Minion, MD;  Location: [place];  Service: Orthopedics;  Laterality: Left;  Left Below Knee Amputation and removal proximal screws IM tibial nail  . Hardware removal Left 06/09/2013    Procedure: HARDWARE REMOVAL;  Surgeon: Newt Minion, MD;  Location: [place];  Service: Orthopedics;  Laterality: Left;  Left Below Knee Amputation  and Removal  proximal screws IM tibial nail  . Amputation Right 09/08/2013    Procedure: AMPUTATION BELOW KNEE;  Surgeon: Newt Minion, MD;  Location: [place];  Service: Orthopedics;  Laterality: Right;  Right Below Knee Amputation  . Amputation Right 10/11/2013    Procedure: AMPUTATION BELOW KNEE;  Surgeon: Newt Minion, MD;  Location: [place];  Service: Orthopedics;  Laterality: Right;  Right Below Knee Amputation Revision    There were no vitals filed for this visit.  Visit Diagnosis:  Generalized weakness  Abnormality of gait  Decreased functional activity tolerance  Balance problems  Status post bilateral below knee amputation      Subjective Assessment - 12/31/14 1109    Subjective Joined [facility] and plans to exercise there to get stronger.   Patient is accompained by: Family member   Currently in Pain? No/denies                         [facility] Adult PT Treatment/Exercise - 12/31/14 1100    Transfers   Sit to Stand 3: Mod assist;4: Min assist;With armrests;From chair/3-in-1;With upper extremity assist  2 person assist w/c to walker   Stand to Sit 3: Mod assist;With upper extremity assist;To chair/3-in-1;With armrests  2nd person for safety   Ambulation/Gait   Ambulation/Gait Yes   Ambulation/Gait Assistance 3: Mod assist;Other (comment);4: Min assist  2cd person with gait, spouse for wheelchair follow   Ambulation/Gait Assistance Details !st gait MinA after 3rd step (2 people for safety) 2nd gait mod A then UEs fatigued   Ambulation Distance (Feet) 20 Feet  x1, 10 feet x1   Assistive device Rolling walker;Prostheses   Gait Pattern Step-through pattern;Decreased stride length;Trunk flexed;Decreased step length - right;Decreased step length - left;Right flexed knee in stance;Left flexed knee in stance;Decreased hip/knee flexion - right;Decreased hip/knee flexion - left   Ambulation Surface Indoor;Level   Prosthetics   Current prosthetic wear tolerance  (days/week)  7 days   Current prosthetic wear tolerance (#hours/day)  90% of awake hours, including dialysis     Therapeutic Exercise: Seated in w/c- bilateral row 10# 10 reps, horizontal chest press 20# 10 reps, chest pull 15# 10 reps, seated abdominal crunch 15# 10 reps, bilateral leg press 35# 10 reps, single leg press 25# 5 reps each See patient education.            PT Education - 12/31/14 1100    Education provided Yes   Education Details strength program with machines with sets to increase total reps and NuStep sets to increase total work time   [employer]) Educated Patient;Spouse   Methods Explanation   Comprehension Verbalized understanding          PT Short Term Goals - 11/30/14 1022    PT SHORT TERM GOAL #1   Title donnes prostheses with minimal assist (Target Date: 10/02/14)   Baseline MET 10/31/14   Time 1   Period Months  Status Achieved   PT SHORT TERM GOAL #2   Title wife & patient verbalize adjusting wear schedule to maximize functional potential. (Target Date: 10/02/14)   Time 1   Period Months   Status Achieved   PT SHORT TERM GOAL #3   Title performs squat-pivot transfer w/c to level mat with minimal assist. (Target Date: 10/02/14)   Baseline MET 09/26/14   Time 1   Period Months   Status Achieved   PT SHORT TERM GOAL #4   Title Sit to/from stand w/c to parallel bars with moderate assist (Target Date: 10/02/14)   Baseline MET 09/26/14   Time 1   Period Months   Status Achieved   PT SHORT TERM GOAL #5   Title patient & wife demonstrate initial HEP correctly. (Target Date: 10/02/14)   Baseline MET 09/21/14   Time 1   Period Months   Status Achieved   PT SHORT TERM GOAL #6   Title reports wear of prostheses daily >80% of awake hours. (Target Date: 11/02/14)   Baseline Partially MET Patient wears prostheses >80% of out of bed time which is ~60% of awake hours.   Time 30   Period Days   Status Partially Met   PT SHORT TERM GOAL #7   Title Squat-pivot  transfer with supervision. (Target Date: 11/02/14)   Baseline Partially MET 10/31/14 Squat-pivot transfer with contact assist for safety without physical assist.   Time 30   Period Days   Status Partially Met   PT SHORT TERM GOAL #8   Title Sit to / from stand w/c to RW with moderate assist. (Target Date: 11/02/14)   Baseline MET 10/31/14   Time 30   Period Days   Status Achieved   PT SHORT TERM GOAL #9   TITLE stand-pivot transfer including sit to/from stand with rolling walker and prostheses with modA. (Target Date:11/30/2014)   Time 4   Period Weeks   Status On-going   PT SHORT TERM GOAL #10   TITLE ambulates 20' with rolling walker and prostheses with moderate assist. (Target Date:11/30/2014)   Baseline 11/30/14- distance goal met, however needed assist mod assist of 2 people   Time 4   Period Weeks   Status Partially Met   PT SHORT TERM GOAL #11   TITLE reports wear >80% of out of bed awake hours except dialysis without skin issues or pain. (Target Date:11/30/2014)   Baseline 11/30/14: wearing ~80% of awake hours or more, including to/from and during dialysis   Time 4   Period Weeks   Status Achieved           PT Long Term Goals - 10/31/14 1145    PT LONG TERM GOAL #1   Title Patient and wife demonstrate proper prosthetic care. (Target Date: 01/02/15)   Time 4   Period Months   Status New   PT LONG TERM GOAL #2   Title tolerates wear of prostheses >90% of awake hours except at dialysis (Target Date: 01/02/15)   Time 4   Period Months   Status On-going   PT LONG TERM GOAL #3   Title stand pivot transfers with rolling walker & prostheses with supervision.  (Target Date: 01/02/15)   Time 4   Period Months   Status Revised   PT LONG TERM GOAL #4   Title manages clothes, reaches 5" and in upper /lower cabinets in standing with UE support with minimal assist.  (Target Date: 01/02/15)   Time 4  Period Months   Status Revised   PT LONG TERM GOAL #5   Title ambulate 41' with rolling walker  & prostheses with minimal assist.  (Target Date: 01/02/15)   Time 4   Period Months   Status Revised               Plan - 12/31/14 1100    Clinical Impression Statement Patient and wife appear to understand recommendation for wt machines initially. Patient needed less assist for gait on 1st rep.   Pt will benefit from skilled therapeutic intervention in order to improve on the following deficits Abnormal gait;Decreased activity tolerance;Decreased balance;Decreased endurance;Decreased knowledge of use of DME;Decreased mobility;Decreased range of motion;Decreased strength;Difficulty walking;Other (comment)  prosthetic dependency   Rehab Potential Good   Clinical Impairments Affecting Rehab Potential multiple medical issues over last year causing decondtioning   PT Frequency 2x / week   PT Duration Other (comment)  4 months   PT Treatment/Interventions ADLs/Self Care Home Management;Gait training;Stair training;DME Instruction;Functional mobility training;Therapeutic activities;Therapeutic exercise;Balance training;Neuromuscular re-education;Patient/family education;Manual techniques;Other (comment)  prosthetic training   PT Next Visit Plan assess LTG's, discharge   Consulted and Agree with Plan of Care Patient;Family member/caregiver   Family Member Consulted wife        Problem List Patient Active Problem List   Diagnosis Date Noted  . Diarrhea 08/14/2014  . Dehydration 10/02/2013  . Fever 10/02/2013  . Altered mental status 10/01/2013  . Encephalopathy, toxic 10/01/2013  . Encephalopathy, metabolic 12/45/8099  . CVA (cerebral vascular accident) 09/28/2013  . FTT (failure to thrive) in adult 09/28/2013  . Acute confusional state 09/28/2013  . Ulcer of sacral region, stage 3 09/26/2013  . S/P BKA (below knee amputation) bilateral 09/08/2013  . ESRD on hemodialysis 05/09/2013  . TIA (transient ischemic attack) 02/20/2013  . Acute blood loss anemia 10/28/2012  . Chronic  combined systolic (EF 83%) and grade 2diastolic congestive heart failure 10/28/2012  . Obstipation 10/28/2012  . GI bleed 10/27/2012  . Mass in rectum 10/27/2012  . DM (diabetes mellitus) type I controlled with renal manifestation 09/08/2012  . LVH (left ventricular hypertrophy)-severe concentric 06/13/2012  . Anemia due to chronic illness 06/12/2012  . Hyperlipidemia 01/30/2011  . CHOLELITHIASIS 08/01/2010  . BENIGN PROSTATIC HYPERTROPHY 08/01/2010  . CEREBROVASCULAR ACCIDENT, HX OF 08/06/2009  . Depression 03/18/2009  . Acute combined systolic and diastolic heart failure, NYHA class 2-EF 45% 03/18/2009  . NEPHROLITHIASIS, HX OF 03/18/2009  . Morbid obesity 03/25/2007  . Essential hypertension 03/25/2007  . GERD 03/25/2007  . HEPATITIS C, HX OF 03/25/2007    Jamey Reas PT, DPT 12/31/2014, 6:29 PM  [facility] [address] [place], [place], [address] Phone: [phone]   Fax:  [phone]

## 2015-01-01 DIAGNOSIS — D509 Iron deficiency anemia, unspecified: Secondary | ICD-10-CM | POA: Diagnosis not present

## 2015-01-01 DIAGNOSIS — D631 Anemia in chronic kidney disease: Secondary | ICD-10-CM | POA: Diagnosis not present

## 2015-01-01 DIAGNOSIS — N186 End stage renal disease: Secondary | ICD-10-CM | POA: Diagnosis not present

## 2015-01-01 DIAGNOSIS — E1122 Type 2 diabetes mellitus with diabetic chronic kidney disease: Secondary | ICD-10-CM | POA: Diagnosis not present

## 2015-01-01 DIAGNOSIS — N2581 Secondary hyperparathyroidism of renal origin: Secondary | ICD-10-CM | POA: Diagnosis not present

## 2015-01-02 ENCOUNTER — Ambulatory Visit: Payer: Medicare Other | Admitting: Physical Therapy

## 2015-01-02 ENCOUNTER — Encounter: Payer: Self-pay | Admitting: Physical Therapy

## 2015-01-02 DIAGNOSIS — M24669 Ankylosis, unspecified knee: Secondary | ICD-10-CM

## 2015-01-02 DIAGNOSIS — R269 Unspecified abnormalities of gait and mobility: Secondary | ICD-10-CM | POA: Diagnosis not present

## 2015-01-02 DIAGNOSIS — Z89512 Acquired absence of left leg below knee: Secondary | ICD-10-CM

## 2015-01-02 DIAGNOSIS — R29818 Other symptoms and signs involving the nervous system: Secondary | ICD-10-CM | POA: Diagnosis not present

## 2015-01-02 DIAGNOSIS — R2689 Other abnormalities of gait and mobility: Secondary | ICD-10-CM

## 2015-01-02 DIAGNOSIS — R531 Weakness: Secondary | ICD-10-CM

## 2015-01-02 DIAGNOSIS — Z89511 Acquired absence of right leg below knee: Secondary | ICD-10-CM

## 2015-01-02 DIAGNOSIS — R6889 Other general symptoms and signs: Secondary | ICD-10-CM

## 2015-01-02 NOTE — Therapy (Signed)
[facility] [address] [place], [place], [address] Phone: [phone]   Fax:  [phone]  Patient Details  Name: Oscar Castillo MRN: [id number] Date of Birth: November 14, 1950 Referring Provider:  No ref. provider found  Encounter Date: 01/02/2015 PHYSICAL THERAPY DISCHARGE SUMMARY  Visits from Start of Care: 38  Current functional level related to goals / functional outcomes:     PT Long Term Goals - 01/02/15 1100    PT LONG TERM GOAL #1   Title Patient and wife demonstrate proper prosthetic care. (Target Date: 01/02/15)   Baseline MET 01/02/2015   Time 4   Period Months   Status Achieved   PT LONG TERM GOAL #2   Title tolerates wear of prostheses >90% of awake hours except at dialysis (Target Date: 01/02/15)   Baseline MET 01/02/2015   Time 4   Period Months   Status Achieved   PT LONG TERM GOAL #3   Title stand pivot transfers with rolling walker & prostheses with supervision.  (Target Date: 01/02/15)   Baseline NOT MET 01/02/2015 Mod A with RW stand pivot   Time 4   Period Months   Status Not Met   PT LONG TERM GOAL #4   Title manages clothes, reaches 5" and in upper /lower cabinets in standing with UE support with minimal assist.  (Target Date: 01/02/15)   Baseline NOT MET 01/02/2015 Sit to stand with minA w/c to RW, able to stand 1 min with BUE support on RW with CGA. Unable to realease RW in standing.   Time 4   Period Months   Status Not Met   PT LONG TERM GOAL #5   Title ambulate 37' with rolling walker & prostheses with minimal assist.  (Target Date: 01/02/15)   Baseline Partial MET patient ambulates 46' with RW / prostheses with minA.   Time 4   Period Months   Status Partially Met       Remaining deficits: Knee & hip flexion contractures, generalized weakness in BUEs, BLEs & trunk with decreased endurance.   Education / Equipment: Prosthetic care, ongoing fitness program at [facility],  Plan: Patient  agrees to discharge.  Patient goals were partially met. Patient is being discharged due to lack of progress.  Patient progressed but has plateaued. PT recommends to work on strength, endurance and flexibility at [employer] with personal trainers. Then return to PT in 2-4 months for evaluation.??         Jamey Reas PT, DPT 01/02/2015, 8:40 PM  [facility] [address] [place], [place], [address] Phone: [phone]   Fax:  [phone]

## 2015-01-02 NOTE — Therapy (Signed)
[facility] [address] [place], [place], [address] Phone: [phone]   Fax:  [phone]  Physical Therapy Treatment  Patient Details  Name: Oscar Castillo MRN: [id number] Date of Birth: 18-Feb-1951 Referring Provider:  [name], MD  Encounter Date: 01/02/2015      PT End of Session - 01/02/15 1100    Visit Number 32  G9   Number of Visits 34   Date for PT Re-Evaluation 01/02/15   Authorization Type G code   PT Start Time 1100   PT Stop Time 1150   PT Time Calculation (min) 50 min   Equipment Utilized During Treatment Gait belt   Activity Tolerance Patient limited by fatigue   Behavior During Therapy [facility] for tasks assessed/performed      Past Medical History  Diagnosis Date  . ESRD on hemodialysis 05/05/2007    ESRD due to DM/HTN. Started dialysis in November 2013.  HD TTS at [facility] on [address].  [name] BACK PAIN, LUMBAR, CHRONIC 08/06/2009  . BENIGN PROSTATIC HYPERTROPHY 08/01/2010  . CEREBROVASCULAR ACCIDENT, HX OF 08/06/2009  . CHOLELITHIASIS 08/01/2010  . CONGESTIVE HEART FAILURE 03/18/2009  . DEPRESSION 03/18/2009  . DIABETES MELLITUS, TYPE II 03/25/2007  . ERECTILE DYSFUNCTION 03/25/2007  . GERD 03/25/2007  . HEPATITIS C, HX OF 03/25/2007  . HYPERTENSION 03/25/2007  . Morbid obesity 03/25/2007  . NEPHROLITHIASIS, HX OF 03/18/2009  . Complication of anesthesia     wife states pt had trouble waking up with his last surgery in Nov., 2014    Past Surgical History  Procedure Laterality Date  . Nephrectomy      partial RR  . Av fistula placement  06/14/2012    Procedure: ARTERIOVENOUS (AV) FISTULA CREATION;  Surgeon: Angelia Mould, MD;  Location: [facility] OR;  Service: Vascular;  Laterality: Left;  Left basilic vein transposition with fistula.  . Tibia im nail insertion Left 09/09/2012    Procedure: INTRAMEDULLARY (IM) NAIL TIBIAL;  Surgeon: Johnny Bridge, MD;  Location: [facility];  Service: Orthopedics;   Laterality: Left;  left tibial nail and open reduction internal fixation left fibula fracture  . Orif fibula fracture Left 09/09/2012    Procedure: OPEN REDUCTION INTERNAL FIXATION (ORIF) FIBULA FRACTURE;  Surgeon: Johnny Bridge, MD;  Location: [facility];  Service: Orthopedics;  Laterality: Left;  . Colonoscopy N/A 10/28/2012    Procedure: COLONOSCOPY;  Surgeon: Jeryl Columbia, MD;  Location: [facility] ENDOSCOPY;  Service: Endoscopy;  Laterality: N/A;  . Esophagogastroduodenoscopy N/A 11/02/2012    Procedure: ESOPHAGOGASTRODUODENOSCOPY (EGD);  Surgeon: Cleotis Nipper, MD;  Location: [facility] ENDOSCOPY;  Service: Endoscopy;  Laterality: N/A;  . Colonoscopy N/A 11/02/2012    Procedure: COLONOSCOPY;  Surgeon: Cleotis Nipper, MD;  Location: [facility] ENDOSCOPY;  Service: Endoscopy;  Laterality: N/A;  . Colonoscopy N/A 11/03/2012    Procedure: COLONOSCOPY;  Surgeon: Cleotis Nipper, MD;  Location: [facility] ENDOSCOPY;  Service: Endoscopy;  Laterality: N/A;  . Givens capsule study N/A 11/04/2012    Procedure: GIVENS CAPSULE STUDY;  Surgeon: Cleotis Nipper, MD;  Location: [facility] ENDOSCOPY;  Service: Endoscopy;  Laterality: N/A;  . Enteroscopy N/A 11/08/2012    Procedure: ENTEROSCOPY;  Surgeon: Wonda Horner, MD;  Location: [facility] ENDOSCOPY;  Service: Endoscopy;  Laterality: N/A;  . Amputation Left 05/12/2013    Procedure: AMPUTATION RAY;  Surgeon: Newt Minion, MD;  Location: [place];  Service: Orthopedics;  Laterality: Left;  Left Foot 1st Ray Amputation  .  Eye surgery Left     to remove scar tissue  . Amputation Left 06/09/2013    Procedure: AMPUTATION BELOW KNEE;  Surgeon: Newt Minion, MD;  Location: [place];  Service: Orthopedics;  Laterality: Left;  Left Below Knee Amputation and removal proximal screws IM tibial nail  . Hardware removal Left 06/09/2013    Procedure: HARDWARE REMOVAL;  Surgeon: Newt Minion, MD;  Location: [place];  Service: Orthopedics;  Laterality: Left;  Left Below Knee Amputation  and Removal proximal screws IM tibial  nail  . Amputation Right 09/08/2013    Procedure: AMPUTATION BELOW KNEE;  Surgeon: Newt Minion, MD;  Location: [place];  Service: Orthopedics;  Laterality: Right;  Right Below Knee Amputation  . Amputation Right 10/11/2013    Procedure: AMPUTATION BELOW KNEE;  Surgeon: Newt Minion, MD;  Location: [place];  Service: Orthopedics;  Laterality: Right;  Right Below Knee Amputation Revision    There were no vitals filed for this visit.  Visit Diagnosis:  Generalized weakness  Decreased functional activity tolerance  Abnormality of gait  Balance problems  Status post bilateral below knee amputation  Decreased range of knee movement, unspecified laterality      Subjective Assessment - 01/02/15 1100    Subjective Ready to start exercise program at [facility] to continue to get stronger.   Currently in Pain? No/denies     Therapeutic Exercise: Mortimer Fries, Physiological scientist from [facility] present to observe & receive instruction in PT recommendation for fitness program. PT recommends to start with Seated recumbent stepper, one UE wt machine, one LE wt machine like leg press and one trunk wt machine like abdominal crunch. PT discussed ways to possibly alter ability to work from w/c or safe transfers.  Seated abdominal crunch 20# 15 reps. Seated chest pull 15# 15 reps Leg Press Bil. 50# 15reps, single on each LE 25# 10 reps each. Hamstring stretch 30 sec hold 2 reps per LE.                     [facility] Adult PT Treatment/Exercise - 01/02/15 1100    Transfers   Sit to Stand 4: Min assist;With armrests;From chair/3-in-1;With upper extremity assist  1 person assist w/c to walker   Stand to Sit With upper extremity assist;To chair/3-in-1;With armrests;4: Min assist  only 1 person assist   Stand Pivot Transfers 3: Mod assist;From elevated surface;With armrests  with RW & prostheses   Squat Pivot Transfers 4: Min assist   Ambulation/Gait   Ambulation/Gait Yes    Ambulation/Gait Assistance 3: Mod assist;Other (comment)  1 person assist & spouse for wheelchair follow   Ambulation Distance (Feet) 18 Feet   Assistive device Rolling walker;Prostheses   Gait Pattern Step-through pattern;Decreased stride length;Trunk flexed;Decreased step length - right;Decreased step length - left;Right flexed knee in stance;Left flexed knee in stance;Decreased hip/knee flexion - right;Decreased hip/knee flexion - left   Ambulation Surface Indoor;Level   Prosthetics   Current prosthetic wear tolerance (days/week)  7 days   Current prosthetic wear tolerance (#hours/day)  90% of awake hours, including dialysis     PT set-up appt with prosthetist for alignment check & possible pads on Monday, 6/13 at 11:00           PT Education - 01/02/15 1100    Education provided Yes   Education Details fitness program recommendations at [facility]) Educated Patient;Spouse;Other (comment)  Mortimer Fries, personal trainer from [facility]   Methods Explanation;Demonstration;Verbal cues   Comprehension Verbalized understanding;Returned demonstration          PT Short Term Goals - 11/30/14 1022    PT SHORT TERM GOAL #1   Title donnes prostheses with minimal assist (Target Date: 10/02/14)   Baseline MET 10/31/14   Time 1   Period Months   Status Achieved   PT SHORT TERM GOAL #2   Title wife & patient verbalize adjusting wear schedule to maximize functional potential. (Target Date: 10/02/14)   Time 1   Period Months   Status Achieved   PT SHORT TERM GOAL #3   Title performs squat-pivot transfer w/c to level mat with minimal assist. (Target Date: 10/02/14)   Baseline MET 09/26/14   Time 1   Period Months   Status Achieved   PT SHORT TERM GOAL #4   Title Sit to/from stand w/c to parallel bars with moderate assist (Target Date: 10/02/14)   Baseline MET 09/26/14   Time 1   Period Months   Status Achieved   PT SHORT TERM GOAL #5   Title patient & wife demonstrate  initial HEP correctly. (Target Date: 10/02/14)   Baseline MET 09/21/14   Time 1   Period Months   Status Achieved   PT SHORT TERM GOAL #6   Title reports wear of prostheses daily >80% of awake hours. (Target Date: 11/02/14)   Baseline Partially MET Patient wears prostheses >80% of out of bed time which is ~60% of awake hours.   Time 30   Period Days   Status Partially Met   PT SHORT TERM GOAL #7   Title Squat-pivot transfer with supervision. (Target Date: 11/02/14)   Baseline Partially MET 10/31/14 Squat-pivot transfer with contact assist for safety without physical assist.   Time 30   Period Days   Status Partially Met   PT SHORT TERM GOAL #8   Title Sit to / from stand w/c to RW with moderate assist. (Target Date: 11/02/14)   Baseline MET 10/31/14   Time 30   Period Days   Status Achieved   PT SHORT TERM GOAL #9   TITLE stand-pivot transfer including sit to/from stand with rolling walker and prostheses with modA. (Target Date:11/30/2014)   Time 4   Period Weeks   Status On-going   PT SHORT TERM GOAL #10   TITLE ambulates 20' with rolling walker and prostheses with moderate assist. (Target Date:11/30/2014)   Baseline 11/30/14- distance goal met, however needed assist mod assist of 2 people   Time 4   Period Weeks   Status Partially Met   PT SHORT TERM GOAL #11   TITLE reports wear >80% of out of bed awake hours except dialysis without skin issues or pain. (Target Date:11/30/2014)   Baseline 11/30/14: wearing ~80% of awake hours or more, including to/from and during dialysis   Time 4   Period Weeks   Status Achieved           PT Long Term Goals - 01/02/15 1100    PT LONG TERM GOAL #1   Title Patient and wife demonstrate proper prosthetic care. (Target Date: 01/02/15)   Baseline MET 01/02/2015   Time 4   Period Months   Status Achieved   PT LONG TERM GOAL #2   Title tolerates wear of prostheses >90% of awake hours except at dialysis (Target Date: 01/02/15)   Baseline MET 01/02/2015   Time 4    Period Months   Status Achieved  PT LONG TERM GOAL #3   Title stand pivot transfers with rolling walker & prostheses with supervision.  (Target Date: 25-Jan-2015)   Baseline NOT MET 01/25/2015 Mod A with RW stand pivot   Time 4   Period Months   Status Not Met   PT LONG TERM GOAL #4   Title manages clothes, reaches 5" and in upper /lower cabinets in standing with UE support with minimal assist.  (Target Date: 01/25/2015)   Baseline NOT MET 01-25-15 Sit to stand with minA w/c to RW, able to stand 1 min with BUE support on RW with CGA. Unable to realease RW in standing.   Time 4   Period Months   Status Not Met   PT LONG TERM GOAL #5   Title ambulate 66' with rolling walker & prostheses with minimal assist.  (Target Date: Jan 25, 2015)   Baseline Partial MET patient ambulates 52' with RW / prostheses with minA.   Time 4   Period Months   Status Partially Met               Plan - 01/25/15 1100    Clinical Impression Statement Patient met fully 2 & partially 1 of LTGs. He seems to understand recommendations for fitness plan.    Pt will benefit from skilled therapeutic intervention in order to improve on the following deficits Abnormal gait;Decreased activity tolerance;Decreased balance;Decreased endurance;Decreased knowledge of use of DME;Decreased mobility;Decreased range of motion;Decreased strength;Difficulty walking;Other (comment)  prosthetic dependency   Rehab Potential Good   Clinical Impairments Affecting Rehab Potential multiple medical issues over last year causing decondtioning   PT Frequency 2x / week   PT Duration Other (comment)  4 months   PT Treatment/Interventions ADLs/Self Care Home Management;Gait training;Stair training;DME Instruction;Functional mobility training;Therapeutic activities;Therapeutic exercise;Balance training;Neuromuscular re-education;Patient/family education;Manual techniques;Other (comment)  prosthetic training   PT Next Visit Plan discharge   Consulted  and Agree with Plan of Care Patient;Family member/caregiver   Family Member Consulted wife          G-Codes - Jan 25, 2015 October 19, 2021    Functional Assessment Tool Used wears prostheses all awake hours including to dialysis, indpendent in prosthetic care including adjusting ply socks.   Functional Limitation Self care   Self Care Goal Status [phone]) At least 20 percent but less than 40 percent impaired, limited or restricted   Self Care Discharge Status [phone]) At least 20 percent but less than 40 percent impaired, limited or restricted      Problem List Patient Active Problem List   Diagnosis Date Noted  . Diarrhea 08/14/2014  . Dehydration 10/02/2013  . Fever 10/02/2013  . Altered mental status 10/01/2013  . Encephalopathy, toxic 10/01/2013  . Encephalopathy, metabolic 07/29/7251  . CVA (cerebral vascular accident) 09/28/2013  . FTT (failure to thrive) in adult 09/28/2013  . Acute confusional state 09/28/2013  . Ulcer of sacral region, stage 3 October 19, 2013  . S/P BKA (below knee amputation) bilateral 09/08/2013  . ESRD on hemodialysis 05/09/2013  . TIA (transient ischemic attack) 02/20/2013  . Acute blood loss anemia 10/28/2012  . Chronic combined systolic (EF 66%) and grade 2diastolic congestive heart failure 10/28/2012  . Obstipation 10/28/2012  . GI bleed 10/27/2012  . Mass in rectum 10/27/2012  . DM (diabetes mellitus) type I controlled with renal manifestation 09/08/2012  . LVH (left ventricular hypertrophy)-severe concentric 06/13/2012  . Anemia due to chronic illness 06/12/2012  . Hyperlipidemia 01/30/2011  . CHOLELITHIASIS 08/01/2010  . BENIGN PROSTATIC HYPERTROPHY 08/01/2010  . CEREBROVASCULAR ACCIDENT, HX  OF 08/06/2009  . Depression 03/18/2009  . Acute combined systolic and diastolic heart failure, NYHA class 2-EF 45% 03/18/2009  . NEPHROLITHIASIS, HX OF 03/18/2009  . Morbid obesity 03/25/2007  . Essential hypertension 03/25/2007  . GERD 03/25/2007  . HEPATITIS C, HX  OF 03/25/2007    Jamey Reas PT, DPT 01/02/2015, 8:30 PM  [facility] [address] [place], [place], [address] Phone: [phone]   Fax:  [phone]

## 2015-01-03 DIAGNOSIS — D509 Iron deficiency anemia, unspecified: Secondary | ICD-10-CM | POA: Diagnosis not present

## 2015-01-03 DIAGNOSIS — D631 Anemia in chronic kidney disease: Secondary | ICD-10-CM | POA: Diagnosis not present

## 2015-01-03 DIAGNOSIS — N2581 Secondary hyperparathyroidism of renal origin: Secondary | ICD-10-CM | POA: Diagnosis not present

## 2015-01-03 DIAGNOSIS — N186 End stage renal disease: Secondary | ICD-10-CM | POA: Diagnosis not present

## 2015-01-03 DIAGNOSIS — E1122 Type 2 diabetes mellitus with diabetic chronic kidney disease: Secondary | ICD-10-CM | POA: Diagnosis not present

## 2015-01-05 DIAGNOSIS — D631 Anemia in chronic kidney disease: Secondary | ICD-10-CM | POA: Diagnosis not present

## 2015-01-05 DIAGNOSIS — N2581 Secondary hyperparathyroidism of renal origin: Secondary | ICD-10-CM | POA: Diagnosis not present

## 2015-01-05 DIAGNOSIS — D509 Iron deficiency anemia, unspecified: Secondary | ICD-10-CM | POA: Diagnosis not present

## 2015-01-05 DIAGNOSIS — N186 End stage renal disease: Secondary | ICD-10-CM | POA: Diagnosis not present

## 2015-01-05 DIAGNOSIS — E1122 Type 2 diabetes mellitus with diabetic chronic kidney disease: Secondary | ICD-10-CM | POA: Diagnosis not present

## 2015-01-08 DIAGNOSIS — D509 Iron deficiency anemia, unspecified: Secondary | ICD-10-CM | POA: Diagnosis not present

## 2015-01-08 DIAGNOSIS — D631 Anemia in chronic kidney disease: Secondary | ICD-10-CM | POA: Diagnosis not present

## 2015-01-08 DIAGNOSIS — N2581 Secondary hyperparathyroidism of renal origin: Secondary | ICD-10-CM | POA: Diagnosis not present

## 2015-01-08 DIAGNOSIS — E1122 Type 2 diabetes mellitus with diabetic chronic kidney disease: Secondary | ICD-10-CM | POA: Diagnosis not present

## 2015-01-08 DIAGNOSIS — N186 End stage renal disease: Secondary | ICD-10-CM | POA: Diagnosis not present

## 2015-01-10 DIAGNOSIS — N186 End stage renal disease: Secondary | ICD-10-CM | POA: Diagnosis not present

## 2015-01-10 DIAGNOSIS — D509 Iron deficiency anemia, unspecified: Secondary | ICD-10-CM | POA: Diagnosis not present

## 2015-01-10 DIAGNOSIS — D631 Anemia in chronic kidney disease: Secondary | ICD-10-CM | POA: Diagnosis not present

## 2015-01-10 DIAGNOSIS — N2581 Secondary hyperparathyroidism of renal origin: Secondary | ICD-10-CM | POA: Diagnosis not present

## 2015-01-10 DIAGNOSIS — E1122 Type 2 diabetes mellitus with diabetic chronic kidney disease: Secondary | ICD-10-CM | POA: Diagnosis not present

## 2015-01-12 DIAGNOSIS — E1122 Type 2 diabetes mellitus with diabetic chronic kidney disease: Secondary | ICD-10-CM | POA: Diagnosis not present

## 2015-01-12 DIAGNOSIS — N2581 Secondary hyperparathyroidism of renal origin: Secondary | ICD-10-CM | POA: Diagnosis not present

## 2015-01-12 DIAGNOSIS — N186 End stage renal disease: Secondary | ICD-10-CM | POA: Diagnosis not present

## 2015-01-12 DIAGNOSIS — D509 Iron deficiency anemia, unspecified: Secondary | ICD-10-CM | POA: Diagnosis not present

## 2015-01-12 DIAGNOSIS — D631 Anemia in chronic kidney disease: Secondary | ICD-10-CM | POA: Diagnosis not present

## 2015-01-15 DIAGNOSIS — E1122 Type 2 diabetes mellitus with diabetic chronic kidney disease: Secondary | ICD-10-CM | POA: Diagnosis not present

## 2015-01-15 DIAGNOSIS — D509 Iron deficiency anemia, unspecified: Secondary | ICD-10-CM | POA: Diagnosis not present

## 2015-01-15 DIAGNOSIS — N186 End stage renal disease: Secondary | ICD-10-CM | POA: Diagnosis not present

## 2015-01-15 DIAGNOSIS — N2581 Secondary hyperparathyroidism of renal origin: Secondary | ICD-10-CM | POA: Diagnosis not present

## 2015-01-15 DIAGNOSIS — D631 Anemia in chronic kidney disease: Secondary | ICD-10-CM | POA: Diagnosis not present

## 2015-01-17 DIAGNOSIS — D509 Iron deficiency anemia, unspecified: Secondary | ICD-10-CM | POA: Diagnosis not present

## 2015-01-17 DIAGNOSIS — N2581 Secondary hyperparathyroidism of renal origin: Secondary | ICD-10-CM | POA: Diagnosis not present

## 2015-01-17 DIAGNOSIS — D631 Anemia in chronic kidney disease: Secondary | ICD-10-CM | POA: Diagnosis not present

## 2015-01-17 DIAGNOSIS — N186 End stage renal disease: Secondary | ICD-10-CM | POA: Diagnosis not present

## 2015-01-17 DIAGNOSIS — E1122 Type 2 diabetes mellitus with diabetic chronic kidney disease: Secondary | ICD-10-CM | POA: Diagnosis not present

## 2015-01-18 DIAGNOSIS — H4011X2 Primary open-angle glaucoma, moderate stage: Secondary | ICD-10-CM | POA: Diagnosis not present

## 2015-01-18 DIAGNOSIS — H2511 Age-related nuclear cataract, right eye: Secondary | ICD-10-CM | POA: Diagnosis not present

## 2015-01-18 LAB — HM DIABETES EYE EXAM

## 2015-01-19 DIAGNOSIS — D631 Anemia in chronic kidney disease: Secondary | ICD-10-CM | POA: Diagnosis not present

## 2015-01-19 DIAGNOSIS — N186 End stage renal disease: Secondary | ICD-10-CM | POA: Diagnosis not present

## 2015-01-19 DIAGNOSIS — D509 Iron deficiency anemia, unspecified: Secondary | ICD-10-CM | POA: Diagnosis not present

## 2015-01-19 DIAGNOSIS — E1122 Type 2 diabetes mellitus with diabetic chronic kidney disease: Secondary | ICD-10-CM | POA: Diagnosis not present

## 2015-01-19 DIAGNOSIS — N2581 Secondary hyperparathyroidism of renal origin: Secondary | ICD-10-CM | POA: Diagnosis not present

## 2015-01-21 ENCOUNTER — Other Ambulatory Visit: Payer: Self-pay

## 2015-01-22 DIAGNOSIS — D509 Iron deficiency anemia, unspecified: Secondary | ICD-10-CM | POA: Diagnosis not present

## 2015-01-22 DIAGNOSIS — N186 End stage renal disease: Secondary | ICD-10-CM | POA: Diagnosis not present

## 2015-01-22 DIAGNOSIS — E1122 Type 2 diabetes mellitus with diabetic chronic kidney disease: Secondary | ICD-10-CM | POA: Diagnosis not present

## 2015-01-22 DIAGNOSIS — D631 Anemia in chronic kidney disease: Secondary | ICD-10-CM | POA: Diagnosis not present

## 2015-01-22 DIAGNOSIS — N2581 Secondary hyperparathyroidism of renal origin: Secondary | ICD-10-CM | POA: Diagnosis not present

## 2015-01-24 ENCOUNTER — Ambulatory Visit (INDEPENDENT_AMBULATORY_CARE_PROVIDER_SITE_OTHER): Payer: Medicare Other | Admitting: Internal Medicine

## 2015-01-24 ENCOUNTER — Telehealth: Payer: Self-pay

## 2015-01-24 ENCOUNTER — Encounter: Payer: Self-pay | Admitting: Internal Medicine

## 2015-01-24 ENCOUNTER — Other Ambulatory Visit (INDEPENDENT_AMBULATORY_CARE_PROVIDER_SITE_OTHER): Payer: Medicare Other

## 2015-01-24 VITALS — BP 110/74 | HR 88 | Temp 98.6°F

## 2015-01-24 DIAGNOSIS — Z992 Dependence on renal dialysis: Secondary | ICD-10-CM | POA: Diagnosis not present

## 2015-01-24 DIAGNOSIS — N186 End stage renal disease: Secondary | ICD-10-CM | POA: Diagnosis not present

## 2015-01-24 DIAGNOSIS — D509 Iron deficiency anemia, unspecified: Secondary | ICD-10-CM | POA: Diagnosis not present

## 2015-01-24 DIAGNOSIS — D631 Anemia in chronic kidney disease: Secondary | ICD-10-CM | POA: Diagnosis not present

## 2015-01-24 DIAGNOSIS — E1122 Type 2 diabetes mellitus with diabetic chronic kidney disease: Secondary | ICD-10-CM | POA: Diagnosis not present

## 2015-01-24 DIAGNOSIS — E1029 Type 1 diabetes mellitus with other diabetic kidney complication: Secondary | ICD-10-CM

## 2015-01-24 DIAGNOSIS — I10 Essential (primary) hypertension: Secondary | ICD-10-CM | POA: Diagnosis not present

## 2015-01-24 DIAGNOSIS — E785 Hyperlipidemia, unspecified: Secondary | ICD-10-CM

## 2015-01-24 DIAGNOSIS — I639 Cerebral infarction, unspecified: Secondary | ICD-10-CM

## 2015-01-24 DIAGNOSIS — N2581 Secondary hyperparathyroidism of renal origin: Secondary | ICD-10-CM | POA: Diagnosis not present

## 2015-01-24 LAB — BASIC METABOLIC PANEL
BUN: 32 mg/dL — AB (ref 6–23)
CO2: 33 meq/L — AB (ref 19–32)
Calcium: 8.7 mg/dL (ref 8.4–10.5)
Chloride: 95 mEq/L — ABNORMAL LOW (ref 96–112)
Creatinine, Ser: 5.33 mg/dL (ref 0.40–1.50)
GFR: 14.05 mL/min — CL (ref >60.00)
GLUCOSE: 124 mg/dL — AB (ref 70–99)
POTASSIUM: 4.3 meq/L (ref 3.5–5.1)
Sodium: 138 mEq/L (ref 135–145)

## 2015-01-24 LAB — HEMOGLOBIN A1C: Hgb A1c MFr Bld: 5 % (ref 4.6–6.5)

## 2015-01-24 LAB — LIPID PANEL
Cholesterol: 153 mg/dL (ref 0–200)
HDL: 35 mg/dL — AB (ref >39.00)
LDL Cholesterol: 99 mg/dL (ref 0–99)
NonHDL: 118
TRIGLYCERIDES: 96 mg/dL (ref 0.0–149.0)
Total CHOL/HDL Ratio: 4
VLDL: 19.2 mg/dL (ref 0.0–40.0)

## 2015-01-24 LAB — HEPATIC FUNCTION PANEL
ALBUMIN: 3.8 g/dL (ref 3.5–5.2)
ALT: 20 U/L (ref 0–53)
AST: 20 U/L (ref 0–37)
Alkaline Phosphatase: 66 U/L (ref 39–117)
BILIRUBIN DIRECT: 0.1 mg/dL (ref 0.0–0.3)
BILIRUBIN TOTAL: 0.4 mg/dL (ref 0.2–1.2)
Total Protein: 8.5 g/dL — ABNORMAL HIGH (ref 6.0–8.3)

## 2015-01-24 NOTE — Progress Notes (Signed)
Subjective:    Patient ID: Oscar Castillo, male    DOB: 26-Oct-1950, 64 y.o.   MRN: [id number]  HPI    Here for yearly f/u;  Overall doing ok;  Pt denies Chest pain, worsening SOB, DOE, wheezing, orthopnea, PND, worsening LE edema, palpitations, dizziness or syncope.  Pt denies neurological change such as new headache, facial or extremity weakness.  Pt denies polydipsia, polyuria, or low sugar symptoms. Pt states overall good compliance with treatment and medications, good tolerability, and has been trying to follow appropriate diet.  Pt denies worsening depressive symptoms, suicidal ideation or panic. No fever, night sweats, wt loss, loss of appetite, or other constitutional symptoms.  Pt states good ability with ADL's, has low fall risk, home safety reviewed and adequate, no other significant changes in hearing or vision.   Still having some every few days with specks of BRB per wife, cannot be seen by former GI due to outstanding bill.  Has been referred to [facility] but heard about appt. Past Medical History  Diagnosis Date  . ESRD on hemodialysis 05/05/2007    ESRD due to DM/HTN. Started dialysis in November 2013.  HD TTS at [facility] on [address].  [name] BACK PAIN, LUMBAR, CHRONIC 08/06/2009  . BENIGN PROSTATIC HYPERTROPHY 08/01/2010  . CEREBROVASCULAR ACCIDENT, HX OF 08/06/2009  . CHOLELITHIASIS 08/01/2010  . CONGESTIVE HEART FAILURE 03/18/2009  . DEPRESSION 03/18/2009  . DIABETES MELLITUS, TYPE II 03/25/2007  . ERECTILE DYSFUNCTION 03/25/2007  . GERD 03/25/2007  . HEPATITIS C, HX OF 03/25/2007  . HYPERTENSION 03/25/2007  . Morbid obesity 03/25/2007  . NEPHROLITHIASIS, HX OF 03/18/2009  . Complication of anesthesia     wife states pt had trouble waking up with his last surgery in Nov., 2014   Past Surgical History  Procedure Laterality Date  . Nephrectomy      partial RR  . Av fistula placement  06/14/2012    Procedure: ARTERIOVENOUS (AV) FISTULA CREATION;  Surgeon: Angelia Mould, MD;   Location: [facility] OR;  Service: Vascular;  Laterality: Left;  Left basilic vein transposition with fistula.  . Tibia im nail insertion Left 09/09/2012    Procedure: INTRAMEDULLARY (IM) NAIL TIBIAL;  Surgeon: Johnny Bridge, MD;  Location: [place];  Service: Orthopedics;  Laterality: Left;  left tibial nail and open reduction internal fixation left fibula fracture  . Orif fibula fracture Left 09/09/2012    Procedure: OPEN REDUCTION INTERNAL FIXATION (ORIF) FIBULA FRACTURE;  Surgeon: Johnny Bridge, MD;  Location: [place];  Service: Orthopedics;  Laterality: Left;  . Colonoscopy N/A 10/28/2012    Procedure: COLONOSCOPY;  Surgeon: Jeryl Columbia, MD;  Location: [facility] ENDOSCOPY;  Service: Endoscopy;  Laterality: N/A;  . Esophagogastroduodenoscopy N/A 11/02/2012    Procedure: ESOPHAGOGASTRODUODENOSCOPY (EGD);  Surgeon: Cleotis Nipper, MD;  Location: [facility] ENDOSCOPY;  Service: Endoscopy;  Laterality: N/A;  . Colonoscopy N/A 11/02/2012    Procedure: COLONOSCOPY;  Surgeon: Cleotis Nipper, MD;  Location: [facility] ENDOSCOPY;  Service: Endoscopy;  Laterality: N/A;  . Colonoscopy N/A 11/03/2012    Procedure: COLONOSCOPY;  Surgeon: Cleotis Nipper, MD;  Location: [facility] ENDOSCOPY;  Service: Endoscopy;  Laterality: N/A;  . Givens capsule study N/A 11/04/2012    Procedure: GIVENS CAPSULE STUDY;  Surgeon: Cleotis Nipper, MD;  Location: [facility] ENDOSCOPY;  Service: Endoscopy;  Laterality: N/A;  . Enteroscopy N/A 11/08/2012    Procedure: ENTEROSCOPY;  Surgeon: Wonda Horner, MD;  Location: [facility] ENDOSCOPY;  Service: Endoscopy;  Laterality: N/A;  . Amputation Left 05/12/2013    Procedure: AMPUTATION RAY;  Surgeon: Newt Minion, MD;  Location: [place];  Service: Orthopedics;  Laterality: Left;  Left Foot 1st Ray Amputation  . Eye surgery Left     to remove scar tissue  . Amputation Left 06/09/2013    Procedure: AMPUTATION BELOW KNEE;  Surgeon: Newt Minion, MD;  Location: [place];  Service: Orthopedics;  Laterality: Left;  Left Below Knee Amputation  and removal proximal screws IM tibial nail  . Hardware removal Left 06/09/2013    Procedure: HARDWARE REMOVAL;  Surgeon: Newt Minion, MD;  Location: [place];  Service: Orthopedics;  Laterality: Left;  Left Below Knee Amputation  and Removal proximal screws IM tibial nail  . Amputation Right 09/08/2013    Procedure: AMPUTATION BELOW KNEE;  Surgeon: Newt Minion, MD;  Location: [place];  Service: Orthopedics;  Laterality: Right;  Right Below Knee Amputation  . Amputation Right 10/11/2013    Procedure: AMPUTATION BELOW KNEE;  Surgeon: Newt Minion, MD;  Location: [place];  Service: Orthopedics;  Laterality: Right;  Right Below Knee Amputation Revision    reports that he has never smoked. He has never used smokeless tobacco. He reports that he does not drink alcohol or use illicit drugs. family history includes Coronary artery disease in his other; Diabetes in his other. No Known Allergies Current Outpatient Prescriptions on File Prior to Visit  Medication Sig Dispense Refill  . acetaminophen (TYLENOL) 500 MG tablet Take 500 mg by mouth every 6 (six) hours as needed for mild pain.    [name] amLODipine (NORVASC) 10 MG tablet Take 1 tablet (10 mg total) by mouth daily. 90 tablet 3  . Blood Glucose Monitoring Suppl (ONE TOUCH ULTRA 2) W/DEVICE KIT Use as directed once daily 1 each 0  . calcium acetate (PHOSLO) 667 MG capsule Take 2 capsules (1,334 mg total) by mouth 3 (three) times daily with meals. 180 capsule 0  . clopidogrel (PLAVIX) 75 MG tablet Take 1 tablet (75 mg total) by mouth daily. 90 tablet 3  . hydrALAZINE (APRESOLINE) 100 MG tablet Take 1 tablet (100 mg total) by mouth every 8 (eight) hours. 90 tablet 11  . HYDROcodone-acetaminophen (NORCO/VICODIN) 5-325 MG per tablet Take 1 tablet by mouth every 6 (six) hours as needed for moderate pain.    [name] labetalol (NORMODYNE) 300 MG tablet Take 1 tablet (300 mg total) by mouth 3 (three) times daily. 90 tablet 11  . LORazepam (ATIVAN) 0.5 MG tablet Take 0.25  mg by mouth every 6 (six) hours as needed for anxiety.    . sertraline (ZOLOFT) 100 MG tablet Take 1 tablet (100 mg total) by mouth daily as needed. For depression 90 tablet 3  . sevelamer carbonate (RENVELA) 800 MG tablet Take 1,600 mg by mouth 3 (three) times daily with meals.    . Amino Acids-Protein Hydrolys (FEEDING SUPPLEMENT, PRO-STAT SUGAR FREE 64,) LIQD Place 30 mLs into feeding tube daily at 12 noon. (Patient not taking: Reported on 01/24/2015) 900 mL 0  . Ascorbic Acid (VITAMIN C PO) Take 1 tablet by mouth daily.    . bisacodyl (DULCOLAX) 5 MG EC tablet Take 1 tablet (5 mg total) by mouth 2 (two) times daily. (Patient not taking: Reported on 01/24/2015) 30 tablet 0  . brimonidine (ALPHAGAN) 0.2 % ophthalmic solution Place 1 drop into both eyes 2 (two) times daily.    . Brinzolamide-Brimonidine (SIMBRINZA) 1-0.2 % SUSP Place 1 drop into  both eyes 2 (two) times daily.     . diphenoxylate-atropine (LOMOTIL) 2.5-0.025 MG per tablet Take 1 tablet by mouth 4 (four) times daily as needed for diarrhea or loose stools. (Patient not taking: Reported on 09/03/2014) 30 tablet 0  . FLUoxetine (PROZAC) 20 MG/5ML solution Place 5 mLs (20 mg total) into feeding tube daily. (Patient not taking: Reported on 09/03/2014) 120 mL 3  . gabapentin (NEURONTIN) 300 MG capsule Take 300 mg by mouth daily as needed (for mild to moderate pain).     [name] glucose blood (ONE TOUCH ULTRA TEST) test strip Use as instructed (Patient not taking: Reported on 09/03/2014) 100 each 12  . haloperidol (HALDOL) 5 MG tablet Take 5 mg by mouth at bedtime as needed for agitation.    . Lancets MISC Use as directed 1 per day  250.02 (Patient not taking: Reported on 09/03/2014) 100 each 11  . multivitamin (RENA-VIT) TABS tablet Take 1 tablet by mouth at bedtime. (Patient not taking: Reported on 01/24/2015) 30 tablet 0  . neomycin-bacitracin-polymyxin (NEOSPORIN) OINT Apply 1 application topically 3 (three) times daily. (Patient not taking: Reported on  09/03/2014) 1 g 0  . Nutritional Supplements (FEEDING SUPPLEMENT, NEPRO CARB STEADY,) LIQD Place 1,000 mLs into feeding tube continuous. (Patient not taking: Reported on 09/03/2014) 30 Can 0  . saccharomyces boulardii (FLORASTOR) 250 MG capsule Take 250 mg by mouth 2 (two) times daily.    . silver sulfADIAZINE (SILVADENE) 1 % cream Apply 1 application topically daily.    . Travoprost, BAK Free, (TRAVATAN) 0.004 % SOLN ophthalmic solution Place 1 drop into both eyes at bedtime. (Patient not taking: Reported on 01/24/2015) 1 Bottle 0  . traZODone (DESYREL) 50 MG tablet Take 50 mg by mouth at bedtime as needed for sleep.    . vancomycin (VANCOCIN) 125 MG capsule Take 125 mg by mouth 4 (four) times daily.    . Water For Irrigation, Sterile (FREE WATER) SOLN Place 175 mLs into feeding tube 4 (four) times daily. (Patient not taking: Reported on 09/03/2014) 1000 mL 0   No current facility-administered medications on file prior to visit.    On HD tue-th-sat. Review of Systems  Constitutional: Negative for unusual diaphoresis or night sweats HENT: Negative for ringing in ear or discharge Eyes: Negative for double vision or worsening visual disturbance.  Respiratory: Negative for choking and stridor.   Gastrointestinal: Negative for vomiting or other signifcant bowel change Genitourinary: Negative for hematuria or change in urine volume.  Musculoskeletal: Negative for other MSK pain or swelling Skin: Negative for color change and worsening wound.  Neurological: Negative for tremors and numbness other than noted  Psychiatric/Behavioral: Negative for decreased concentration or agitation other than above       Objective:   Physical Exam BP 110/74 mmHg  Pulse 88  Temp(Src) 98.6 F (37 C) (Oral)  SpO2 97% VS noted,  Constitutional: Pt appears in no significant distress HENT: Head: NCAT.  Right Ear: External ear normal.  Left Ear: External ear normal.  Eyes: . Pupils are equal, round, and reactive to  light. Conjunctivae and EOM are normal Neck: Normal range of motion. Neck supple.  Cardiovascular: Normal rate and regular rhythm.   Pulmonary/Chest: Effort normal and breath sounds without rales or wheezing.  Abd:  Soft, NT, ND, + BS Neurological: Pt is alert. Not confused , motor grossly intact Skin: Skin is warm. No rash, Psychiatric: Pt behavior is normal. No agitation.  S/p bilat LKE BKA  Assessment & Plan:

## 2015-01-24 NOTE — Assessment & Plan Note (Signed)
stable overall by history and exam, recent data reviewed with pt, and pt to continue medical treatment as before,  to f/u any worsening symptoms or concerns Lab Results  Component Value Date   HGBA1C 5.8 08/14/2014   For f/u lab today

## 2015-01-24 NOTE — Patient Instructions (Addendum)
Please continue all other medications as before, and refills have been done if requested.  Please have the pharmacy call with any other refills you may need.  Please continue your efforts at being more active, low cholesterol diet, and weight control.  You are otherwise up to date with prevention measures today.  Please keep your appointments with your specialists as you may have planned  Please go to the LAB in the Basement (turn left off the elevator) for the tests to be done today  You will be contacted by phone if any changes need to be made immediately.  Otherwise, you will receive a letter about your results with an explanation, but please check with MyChart first.  Please remember to sign up for MyChart if you have not done so, as this will be important to you in the future with finding out test results, communicating by private email, and scheduling acute appointments online when needed.  You will be contacted regarding the referral for: GI - to see [facility] today  Please return in 6 months, or sooner if needed, with Lab testing done 3-5 days before

## 2015-01-24 NOTE — Assessment & Plan Note (Signed)
stable overall by history and exam, recent data reviewed with pt, and pt to continue medical treatment as before,  to f/u any worsening symptoms or concerns BP Readings from Last 3 Encounters:  01/24/15 110/74  11/28/14 113/71  09/21/14 152/86

## 2015-01-24 NOTE — Assessment & Plan Note (Signed)
stable overall by history and exam, recent data reviewed with pt, and pt to continue medical treatment as before,  to f/u any worsening symptoms or concerns Lab Results  Component Value Date   LDLCALC 65 08/14/2014

## 2015-01-24 NOTE — Telephone Encounter (Signed)
Critical Value Creatinine 5.33 GFR 14.05

## 2015-01-24 NOTE — Progress Notes (Signed)
Pre visit review using our clinic review tool, if applicable. No additional management support is needed unless otherwise documented below in the visit note. 

## 2015-01-26 DIAGNOSIS — N2581 Secondary hyperparathyroidism of renal origin: Secondary | ICD-10-CM | POA: Diagnosis not present

## 2015-01-26 DIAGNOSIS — D509 Iron deficiency anemia, unspecified: Secondary | ICD-10-CM | POA: Diagnosis not present

## 2015-01-26 DIAGNOSIS — N186 End stage renal disease: Secondary | ICD-10-CM | POA: Diagnosis not present

## 2015-01-26 DIAGNOSIS — E1122 Type 2 diabetes mellitus with diabetic chronic kidney disease: Secondary | ICD-10-CM | POA: Diagnosis not present

## 2015-01-29 DIAGNOSIS — E1122 Type 2 diabetes mellitus with diabetic chronic kidney disease: Secondary | ICD-10-CM | POA: Diagnosis not present

## 2015-01-29 DIAGNOSIS — N2581 Secondary hyperparathyroidism of renal origin: Secondary | ICD-10-CM | POA: Diagnosis not present

## 2015-01-29 DIAGNOSIS — N186 End stage renal disease: Secondary | ICD-10-CM | POA: Diagnosis not present

## 2015-01-29 DIAGNOSIS — D509 Iron deficiency anemia, unspecified: Secondary | ICD-10-CM | POA: Diagnosis not present

## 2015-01-31 DIAGNOSIS — E1122 Type 2 diabetes mellitus with diabetic chronic kidney disease: Secondary | ICD-10-CM | POA: Diagnosis not present

## 2015-01-31 DIAGNOSIS — N2581 Secondary hyperparathyroidism of renal origin: Secondary | ICD-10-CM | POA: Diagnosis not present

## 2015-01-31 DIAGNOSIS — N186 End stage renal disease: Secondary | ICD-10-CM | POA: Diagnosis not present

## 2015-01-31 DIAGNOSIS — D509 Iron deficiency anemia, unspecified: Secondary | ICD-10-CM | POA: Diagnosis not present

## 2015-02-02 DIAGNOSIS — D509 Iron deficiency anemia, unspecified: Secondary | ICD-10-CM | POA: Diagnosis not present

## 2015-02-02 DIAGNOSIS — N186 End stage renal disease: Secondary | ICD-10-CM | POA: Diagnosis not present

## 2015-02-02 DIAGNOSIS — N2581 Secondary hyperparathyroidism of renal origin: Secondary | ICD-10-CM | POA: Diagnosis not present

## 2015-02-02 DIAGNOSIS — E1122 Type 2 diabetes mellitus with diabetic chronic kidney disease: Secondary | ICD-10-CM | POA: Diagnosis not present

## 2015-02-05 DIAGNOSIS — D509 Iron deficiency anemia, unspecified: Secondary | ICD-10-CM | POA: Diagnosis not present

## 2015-02-05 DIAGNOSIS — N2581 Secondary hyperparathyroidism of renal origin: Secondary | ICD-10-CM | POA: Diagnosis not present

## 2015-02-05 DIAGNOSIS — E1122 Type 2 diabetes mellitus with diabetic chronic kidney disease: Secondary | ICD-10-CM | POA: Diagnosis not present

## 2015-02-05 DIAGNOSIS — N186 End stage renal disease: Secondary | ICD-10-CM | POA: Diagnosis not present

## 2015-02-07 DIAGNOSIS — N2581 Secondary hyperparathyroidism of renal origin: Secondary | ICD-10-CM | POA: Diagnosis not present

## 2015-02-07 DIAGNOSIS — E1122 Type 2 diabetes mellitus with diabetic chronic kidney disease: Secondary | ICD-10-CM | POA: Diagnosis not present

## 2015-02-07 DIAGNOSIS — N186 End stage renal disease: Secondary | ICD-10-CM | POA: Diagnosis not present

## 2015-02-07 DIAGNOSIS — D509 Iron deficiency anemia, unspecified: Secondary | ICD-10-CM | POA: Diagnosis not present

## 2015-02-08 ENCOUNTER — Encounter: Payer: Self-pay | Admitting: Internal Medicine

## 2015-02-09 DIAGNOSIS — N186 End stage renal disease: Secondary | ICD-10-CM | POA: Diagnosis not present

## 2015-02-09 DIAGNOSIS — E1122 Type 2 diabetes mellitus with diabetic chronic kidney disease: Secondary | ICD-10-CM | POA: Diagnosis not present

## 2015-02-09 DIAGNOSIS — D509 Iron deficiency anemia, unspecified: Secondary | ICD-10-CM | POA: Diagnosis not present

## 2015-02-09 DIAGNOSIS — N2581 Secondary hyperparathyroidism of renal origin: Secondary | ICD-10-CM | POA: Diagnosis not present

## 2015-02-12 DIAGNOSIS — E1122 Type 2 diabetes mellitus with diabetic chronic kidney disease: Secondary | ICD-10-CM | POA: Diagnosis not present

## 2015-02-12 DIAGNOSIS — N2581 Secondary hyperparathyroidism of renal origin: Secondary | ICD-10-CM | POA: Diagnosis not present

## 2015-02-12 DIAGNOSIS — D509 Iron deficiency anemia, unspecified: Secondary | ICD-10-CM | POA: Diagnosis not present

## 2015-02-12 DIAGNOSIS — N186 End stage renal disease: Secondary | ICD-10-CM | POA: Diagnosis not present

## 2015-02-14 DIAGNOSIS — N2581 Secondary hyperparathyroidism of renal origin: Secondary | ICD-10-CM | POA: Diagnosis not present

## 2015-02-14 DIAGNOSIS — N186 End stage renal disease: Secondary | ICD-10-CM | POA: Diagnosis not present

## 2015-02-14 DIAGNOSIS — D509 Iron deficiency anemia, unspecified: Secondary | ICD-10-CM | POA: Diagnosis not present

## 2015-02-14 DIAGNOSIS — E1122 Type 2 diabetes mellitus with diabetic chronic kidney disease: Secondary | ICD-10-CM | POA: Diagnosis not present

## 2015-02-16 DIAGNOSIS — N186 End stage renal disease: Secondary | ICD-10-CM | POA: Diagnosis not present

## 2015-02-16 DIAGNOSIS — N2581 Secondary hyperparathyroidism of renal origin: Secondary | ICD-10-CM | POA: Diagnosis not present

## 2015-02-16 DIAGNOSIS — E1122 Type 2 diabetes mellitus with diabetic chronic kidney disease: Secondary | ICD-10-CM | POA: Diagnosis not present

## 2015-02-16 DIAGNOSIS — D509 Iron deficiency anemia, unspecified: Secondary | ICD-10-CM | POA: Diagnosis not present

## 2015-02-19 DIAGNOSIS — E1122 Type 2 diabetes mellitus with diabetic chronic kidney disease: Secondary | ICD-10-CM | POA: Diagnosis not present

## 2015-02-19 DIAGNOSIS — N186 End stage renal disease: Secondary | ICD-10-CM | POA: Diagnosis not present

## 2015-02-19 DIAGNOSIS — N2581 Secondary hyperparathyroidism of renal origin: Secondary | ICD-10-CM | POA: Diagnosis not present

## 2015-02-19 DIAGNOSIS — D509 Iron deficiency anemia, unspecified: Secondary | ICD-10-CM | POA: Diagnosis not present

## 2015-02-21 DIAGNOSIS — N2581 Secondary hyperparathyroidism of renal origin: Secondary | ICD-10-CM | POA: Diagnosis not present

## 2015-02-21 DIAGNOSIS — E1122 Type 2 diabetes mellitus with diabetic chronic kidney disease: Secondary | ICD-10-CM | POA: Diagnosis not present

## 2015-02-21 DIAGNOSIS — D509 Iron deficiency anemia, unspecified: Secondary | ICD-10-CM | POA: Diagnosis not present

## 2015-02-21 DIAGNOSIS — N186 End stage renal disease: Secondary | ICD-10-CM | POA: Diagnosis not present

## 2015-02-24 DIAGNOSIS — N186 End stage renal disease: Secondary | ICD-10-CM | POA: Diagnosis not present

## 2015-02-24 DIAGNOSIS — E1122 Type 2 diabetes mellitus with diabetic chronic kidney disease: Secondary | ICD-10-CM | POA: Diagnosis not present

## 2015-02-24 DIAGNOSIS — Z992 Dependence on renal dialysis: Secondary | ICD-10-CM | POA: Diagnosis not present

## 2015-02-26 DIAGNOSIS — N186 End stage renal disease: Secondary | ICD-10-CM | POA: Diagnosis not present

## 2015-02-26 DIAGNOSIS — N2581 Secondary hyperparathyroidism of renal origin: Secondary | ICD-10-CM | POA: Diagnosis not present

## 2015-02-26 DIAGNOSIS — D509 Iron deficiency anemia, unspecified: Secondary | ICD-10-CM | POA: Diagnosis not present

## 2015-02-26 DIAGNOSIS — E1122 Type 2 diabetes mellitus with diabetic chronic kidney disease: Secondary | ICD-10-CM | POA: Diagnosis not present

## 2015-02-26 DIAGNOSIS — E875 Hyperkalemia: Secondary | ICD-10-CM | POA: Diagnosis not present

## 2015-02-27 DIAGNOSIS — Z992 Dependence on renal dialysis: Secondary | ICD-10-CM | POA: Diagnosis not present

## 2015-02-27 DIAGNOSIS — I871 Compression of vein: Secondary | ICD-10-CM | POA: Diagnosis not present

## 2015-02-27 DIAGNOSIS — T82858D Stenosis of vascular prosthetic devices, implants and grafts, subsequent encounter: Secondary | ICD-10-CM | POA: Diagnosis not present

## 2015-02-27 DIAGNOSIS — N186 End stage renal disease: Secondary | ICD-10-CM | POA: Diagnosis not present

## 2015-02-28 DIAGNOSIS — N2581 Secondary hyperparathyroidism of renal origin: Secondary | ICD-10-CM | POA: Diagnosis not present

## 2015-02-28 DIAGNOSIS — D509 Iron deficiency anemia, unspecified: Secondary | ICD-10-CM | POA: Diagnosis not present

## 2015-02-28 DIAGNOSIS — E875 Hyperkalemia: Secondary | ICD-10-CM | POA: Diagnosis not present

## 2015-02-28 DIAGNOSIS — E1122 Type 2 diabetes mellitus with diabetic chronic kidney disease: Secondary | ICD-10-CM | POA: Diagnosis not present

## 2015-02-28 DIAGNOSIS — N186 End stage renal disease: Secondary | ICD-10-CM | POA: Diagnosis not present

## 2015-03-02 DIAGNOSIS — E1122 Type 2 diabetes mellitus with diabetic chronic kidney disease: Secondary | ICD-10-CM | POA: Diagnosis not present

## 2015-03-02 DIAGNOSIS — N186 End stage renal disease: Secondary | ICD-10-CM | POA: Diagnosis not present

## 2015-03-02 DIAGNOSIS — E875 Hyperkalemia: Secondary | ICD-10-CM | POA: Diagnosis not present

## 2015-03-02 DIAGNOSIS — N2581 Secondary hyperparathyroidism of renal origin: Secondary | ICD-10-CM | POA: Diagnosis not present

## 2015-03-02 DIAGNOSIS — D509 Iron deficiency anemia, unspecified: Secondary | ICD-10-CM | POA: Diagnosis not present

## 2015-03-05 DIAGNOSIS — D509 Iron deficiency anemia, unspecified: Secondary | ICD-10-CM | POA: Diagnosis not present

## 2015-03-05 DIAGNOSIS — N186 End stage renal disease: Secondary | ICD-10-CM | POA: Diagnosis not present

## 2015-03-05 DIAGNOSIS — E1122 Type 2 diabetes mellitus with diabetic chronic kidney disease: Secondary | ICD-10-CM | POA: Diagnosis not present

## 2015-03-05 DIAGNOSIS — N2581 Secondary hyperparathyroidism of renal origin: Secondary | ICD-10-CM | POA: Diagnosis not present

## 2015-03-05 DIAGNOSIS — E875 Hyperkalemia: Secondary | ICD-10-CM | POA: Diagnosis not present

## 2015-03-06 ENCOUNTER — Encounter: Payer: Self-pay | Admitting: *Deleted

## 2015-03-07 DIAGNOSIS — N186 End stage renal disease: Secondary | ICD-10-CM | POA: Diagnosis not present

## 2015-03-07 DIAGNOSIS — E1122 Type 2 diabetes mellitus with diabetic chronic kidney disease: Secondary | ICD-10-CM | POA: Diagnosis not present

## 2015-03-07 DIAGNOSIS — D509 Iron deficiency anemia, unspecified: Secondary | ICD-10-CM | POA: Diagnosis not present

## 2015-03-07 DIAGNOSIS — N2581 Secondary hyperparathyroidism of renal origin: Secondary | ICD-10-CM | POA: Diagnosis not present

## 2015-03-07 DIAGNOSIS — E875 Hyperkalemia: Secondary | ICD-10-CM | POA: Diagnosis not present

## 2015-03-09 DIAGNOSIS — D509 Iron deficiency anemia, unspecified: Secondary | ICD-10-CM | POA: Diagnosis not present

## 2015-03-09 DIAGNOSIS — N2581 Secondary hyperparathyroidism of renal origin: Secondary | ICD-10-CM | POA: Diagnosis not present

## 2015-03-09 DIAGNOSIS — E875 Hyperkalemia: Secondary | ICD-10-CM | POA: Diagnosis not present

## 2015-03-09 DIAGNOSIS — N186 End stage renal disease: Secondary | ICD-10-CM | POA: Diagnosis not present

## 2015-03-09 DIAGNOSIS — E1122 Type 2 diabetes mellitus with diabetic chronic kidney disease: Secondary | ICD-10-CM | POA: Diagnosis not present

## 2015-03-11 DIAGNOSIS — E875 Hyperkalemia: Secondary | ICD-10-CM | POA: Diagnosis not present

## 2015-03-11 DIAGNOSIS — N186 End stage renal disease: Secondary | ICD-10-CM | POA: Diagnosis not present

## 2015-03-11 DIAGNOSIS — N2581 Secondary hyperparathyroidism of renal origin: Secondary | ICD-10-CM | POA: Diagnosis not present

## 2015-03-11 DIAGNOSIS — D509 Iron deficiency anemia, unspecified: Secondary | ICD-10-CM | POA: Diagnosis not present

## 2015-03-11 DIAGNOSIS — E1122 Type 2 diabetes mellitus with diabetic chronic kidney disease: Secondary | ICD-10-CM | POA: Diagnosis not present

## 2015-03-14 DIAGNOSIS — D509 Iron deficiency anemia, unspecified: Secondary | ICD-10-CM | POA: Diagnosis not present

## 2015-03-14 DIAGNOSIS — N186 End stage renal disease: Secondary | ICD-10-CM | POA: Diagnosis not present

## 2015-03-14 DIAGNOSIS — E1122 Type 2 diabetes mellitus with diabetic chronic kidney disease: Secondary | ICD-10-CM | POA: Diagnosis not present

## 2015-03-14 DIAGNOSIS — E875 Hyperkalemia: Secondary | ICD-10-CM | POA: Diagnosis not present

## 2015-03-14 DIAGNOSIS — N2581 Secondary hyperparathyroidism of renal origin: Secondary | ICD-10-CM | POA: Diagnosis not present

## 2015-03-16 DIAGNOSIS — N2581 Secondary hyperparathyroidism of renal origin: Secondary | ICD-10-CM | POA: Diagnosis not present

## 2015-03-16 DIAGNOSIS — N186 End stage renal disease: Secondary | ICD-10-CM | POA: Diagnosis not present

## 2015-03-16 DIAGNOSIS — D509 Iron deficiency anemia, unspecified: Secondary | ICD-10-CM | POA: Diagnosis not present

## 2015-03-16 DIAGNOSIS — E875 Hyperkalemia: Secondary | ICD-10-CM | POA: Diagnosis not present

## 2015-03-16 DIAGNOSIS — E1122 Type 2 diabetes mellitus with diabetic chronic kidney disease: Secondary | ICD-10-CM | POA: Diagnosis not present

## 2015-03-18 DIAGNOSIS — B1920 Unspecified viral hepatitis C without hepatic coma: Secondary | ICD-10-CM | POA: Diagnosis not present

## 2015-03-18 DIAGNOSIS — Z992 Dependence on renal dialysis: Secondary | ICD-10-CM | POA: Diagnosis not present

## 2015-03-18 DIAGNOSIS — N186 End stage renal disease: Secondary | ICD-10-CM | POA: Diagnosis not present

## 2015-03-19 DIAGNOSIS — N2581 Secondary hyperparathyroidism of renal origin: Secondary | ICD-10-CM | POA: Diagnosis not present

## 2015-03-19 DIAGNOSIS — N186 End stage renal disease: Secondary | ICD-10-CM | POA: Diagnosis not present

## 2015-03-19 DIAGNOSIS — E875 Hyperkalemia: Secondary | ICD-10-CM | POA: Diagnosis not present

## 2015-03-19 DIAGNOSIS — D509 Iron deficiency anemia, unspecified: Secondary | ICD-10-CM | POA: Diagnosis not present

## 2015-03-19 DIAGNOSIS — E1122 Type 2 diabetes mellitus with diabetic chronic kidney disease: Secondary | ICD-10-CM | POA: Diagnosis not present

## 2015-03-21 DIAGNOSIS — D509 Iron deficiency anemia, unspecified: Secondary | ICD-10-CM | POA: Diagnosis not present

## 2015-03-21 DIAGNOSIS — N186 End stage renal disease: Secondary | ICD-10-CM | POA: Diagnosis not present

## 2015-03-21 DIAGNOSIS — E1122 Type 2 diabetes mellitus with diabetic chronic kidney disease: Secondary | ICD-10-CM | POA: Diagnosis not present

## 2015-03-21 DIAGNOSIS — N2581 Secondary hyperparathyroidism of renal origin: Secondary | ICD-10-CM | POA: Diagnosis not present

## 2015-03-21 DIAGNOSIS — E875 Hyperkalemia: Secondary | ICD-10-CM | POA: Diagnosis not present

## 2015-03-22 DIAGNOSIS — Z01818 Encounter for other preprocedural examination: Secondary | ICD-10-CM | POA: Diagnosis not present

## 2015-03-23 DIAGNOSIS — E875 Hyperkalemia: Secondary | ICD-10-CM | POA: Diagnosis not present

## 2015-03-23 DIAGNOSIS — N2581 Secondary hyperparathyroidism of renal origin: Secondary | ICD-10-CM | POA: Diagnosis not present

## 2015-03-23 DIAGNOSIS — E1122 Type 2 diabetes mellitus with diabetic chronic kidney disease: Secondary | ICD-10-CM | POA: Diagnosis not present

## 2015-03-23 DIAGNOSIS — D509 Iron deficiency anemia, unspecified: Secondary | ICD-10-CM | POA: Diagnosis not present

## 2015-03-23 DIAGNOSIS — N186 End stage renal disease: Secondary | ICD-10-CM | POA: Diagnosis not present

## 2015-03-26 DIAGNOSIS — N2581 Secondary hyperparathyroidism of renal origin: Secondary | ICD-10-CM | POA: Diagnosis not present

## 2015-03-26 DIAGNOSIS — E1122 Type 2 diabetes mellitus with diabetic chronic kidney disease: Secondary | ICD-10-CM | POA: Diagnosis not present

## 2015-03-26 DIAGNOSIS — E875 Hyperkalemia: Secondary | ICD-10-CM | POA: Diagnosis not present

## 2015-03-26 DIAGNOSIS — N186 End stage renal disease: Secondary | ICD-10-CM | POA: Diagnosis not present

## 2015-03-26 DIAGNOSIS — D509 Iron deficiency anemia, unspecified: Secondary | ICD-10-CM | POA: Diagnosis not present

## 2015-03-27 DIAGNOSIS — E1122 Type 2 diabetes mellitus with diabetic chronic kidney disease: Secondary | ICD-10-CM | POA: Diagnosis not present

## 2015-03-27 DIAGNOSIS — N186 End stage renal disease: Secondary | ICD-10-CM | POA: Diagnosis not present

## 2015-03-27 DIAGNOSIS — Z992 Dependence on renal dialysis: Secondary | ICD-10-CM | POA: Diagnosis not present

## 2015-03-27 DIAGNOSIS — T82898A Other specified complication of vascular prosthetic devices, implants and grafts, initial encounter: Secondary | ICD-10-CM | POA: Diagnosis not present

## 2015-03-30 DIAGNOSIS — N2581 Secondary hyperparathyroidism of renal origin: Secondary | ICD-10-CM | POA: Diagnosis not present

## 2015-03-30 DIAGNOSIS — N186 End stage renal disease: Secondary | ICD-10-CM | POA: Diagnosis not present

## 2015-03-30 DIAGNOSIS — Z23 Encounter for immunization: Secondary | ICD-10-CM | POA: Diagnosis not present

## 2015-03-30 DIAGNOSIS — E1122 Type 2 diabetes mellitus with diabetic chronic kidney disease: Secondary | ICD-10-CM | POA: Diagnosis not present

## 2015-04-02 ENCOUNTER — Encounter: Payer: Self-pay | Admitting: Internal Medicine

## 2015-04-02 ENCOUNTER — Ambulatory Visit (INDEPENDENT_AMBULATORY_CARE_PROVIDER_SITE_OTHER): Payer: Medicare Other | Admitting: Internal Medicine

## 2015-04-02 VITALS — BP 110/64 | HR 88

## 2015-04-02 DIAGNOSIS — N2581 Secondary hyperparathyroidism of renal origin: Secondary | ICD-10-CM | POA: Diagnosis not present

## 2015-04-02 DIAGNOSIS — N186 End stage renal disease: Secondary | ICD-10-CM | POA: Diagnosis not present

## 2015-04-02 DIAGNOSIS — R197 Diarrhea, unspecified: Secondary | ICD-10-CM

## 2015-04-02 DIAGNOSIS — Z8719 Personal history of other diseases of the digestive system: Secondary | ICD-10-CM | POA: Diagnosis not present

## 2015-04-02 DIAGNOSIS — IMO0001 Reserved for inherently not codable concepts without codable children: Secondary | ICD-10-CM

## 2015-04-02 DIAGNOSIS — Z23 Encounter for immunization: Secondary | ICD-10-CM | POA: Diagnosis not present

## 2015-04-02 DIAGNOSIS — E1122 Type 2 diabetes mellitus with diabetic chronic kidney disease: Secondary | ICD-10-CM | POA: Diagnosis not present

## 2015-04-02 NOTE — Progress Notes (Signed)
Patient ID: Oscar Castillo, male   DOB: 07/16/1951, 64 y.o.   MRN: [id number] HPI: Oscar Castillo is a 64 year old male with a past medical history of ESRD on dialysis, peripheral vascular disease status post bilateral BKA, history of CHF, CVA, bacteremia and prolonged hospitalization in 2015, history of PEG placement 2015 since removed, history of C. difficile, hypertension and diabetes who seen in consultation at the request of Dr. [name] to evaluate diarrhea. He is here today with his wife. He reports that he has been having loose watery stools on an ongoing basis since 07/21/2014. He reports this seems to be off and on and 4 or so days per week he has uncontrollable liquid stools. This only occurs 1-2 times daily. Nonbloody. Has to wear a pull-up because he can have accidents. Other days his stool is more formed and easier to control. Interestingly never goes more than 1 or 2 times per day. Doesn't seem to relate to dialysis days. Denies abdominal pain. Denies nausea or vomiting. No trouble swallowing. No heartburn. No change in medications.  On review of records a GI pathogen panel from 07/21/2014 was positive for C. difficile. He believes this was treated. He also has a history of prior C. difficile on several occasions while he was in rehabilitation. He also has had multiple colonoscopies performed by [facility] to evaluate hematochezia of unclear cause. Colonoscopy performed on 10/28/2012 which showed internal and external hemorrhoids but was otherwise normal to the cecum. Colonoscopy on 11/02/2012 showed blood tinged stool mostly in the left colon more brown stool in the mid colon. This was unprepped and partial colonoscopy.  Colonoscopy again all 11/03/2012 after bowel prep showed active GI bleeding felt to be from the small bowel. No colonic abnormalities. Prior upper endoscopy and small bowel enteroscopy on 11/08/2012 was normal.  Past Medical History  Diagnosis Date  . ESRD on hemodialysis 05/05/2007   ESRD due to DM/HTN. Started dialysis in November 2013.  HD TTS at [facility] on [address].  [name] BACK PAIN, LUMBAR, CHRONIC 08/06/2009  . BENIGN PROSTATIC HYPERTROPHY 08/01/2010  . CEREBROVASCULAR ACCIDENT, HX OF 08/06/2009  . CHOLELITHIASIS 08/01/2010  . CONGESTIVE HEART FAILURE 03/18/2009  . DEPRESSION 03/18/2009  . DIABETES MELLITUS, TYPE II 03/25/2007  . ERECTILE DYSFUNCTION 03/25/2007  . GERD 03/25/2007  . HEPATITIS C, HX OF 03/25/2007  . HYPERTENSION 03/25/2007  . Morbid obesity 03/25/2007  . NEPHROLITHIASIS, HX OF 03/18/2009  . Complication of anesthesia     wife states pt had trouble waking up with his last surgery in Nov., 2014  . History of Clostridium difficile   . Hemorrhoids   . Anemia   . Antral ulcer 2014    small    Past Surgical History  Procedure Laterality Date  . Nephrectomy      partial RR  . Av fistula placement  06/14/2012    Procedure: ARTERIOVENOUS (AV) FISTULA CREATION;  Surgeon: Angelia Mould, MD;  Location: [facility] OR;  Service: Vascular;  Laterality: Left;  Left basilic vein transposition with fistula.  . Tibia im nail insertion Left 09/09/2012    Procedure: INTRAMEDULLARY (IM) NAIL TIBIAL;  Surgeon: Johnny Bridge, MD;  Location: [place];  Service: Orthopedics;  Laterality: Left;  left tibial nail and open reduction internal fixation left fibula fracture  . Orif fibula fracture Left 09/09/2012    Procedure: OPEN REDUCTION INTERNAL FIXATION (ORIF) FIBULA FRACTURE;  Surgeon: Johnny Bridge, MD;  Location: [place];  Service: Orthopedics;  Laterality: Left;  .  Colonoscopy N/A 10/28/2012    Procedure: COLONOSCOPY;  Surgeon: Jeryl Columbia, MD;  Location: [facility] ENDOSCOPY;  Service: Endoscopy;  Laterality: N/A;  . Esophagogastroduodenoscopy N/A 11/02/2012    Procedure: ESOPHAGOGASTRODUODENOSCOPY (EGD);  Surgeon: Cleotis Nipper, MD;  Location: [facility] ENDOSCOPY;  Service: Endoscopy;  Laterality: N/A;  . Colonoscopy N/A 11/02/2012    Procedure: COLONOSCOPY;  Surgeon: Cleotis Nipper,  MD;  Location: [facility] ENDOSCOPY;  Service: Endoscopy;  Laterality: N/A;  . Colonoscopy N/A 11/03/2012    Procedure: COLONOSCOPY;  Surgeon: Cleotis Nipper, MD;  Location: [facility] ENDOSCOPY;  Service: Endoscopy;  Laterality: N/A;  . Givens capsule study N/A 11/04/2012    Procedure: GIVENS CAPSULE STUDY;  Surgeon: Cleotis Nipper, MD;  Location: [facility] ENDOSCOPY;  Service: Endoscopy;  Laterality: N/A;  . Enteroscopy N/A 11/08/2012    Procedure: ENTEROSCOPY;  Surgeon: Wonda Horner, MD;  Location: [facility] ENDOSCOPY;  Service: Endoscopy;  Laterality: N/A;  . Amputation Left 05/12/2013    Procedure: AMPUTATION RAY;  Surgeon: Newt Minion, MD;  Location: [place];  Service: Orthopedics;  Laterality: Left;  Left Foot 1st Ray Amputation  . Eye surgery Left     to remove scar tissue  . Amputation Left 06/09/2013    Procedure: AMPUTATION BELOW KNEE;  Surgeon: Newt Minion, MD;  Location: [place];  Service: Orthopedics;  Laterality: Left;  Left Below Knee Amputation and removal proximal screws IM tibial nail  . Hardware removal Left 06/09/2013    Procedure: HARDWARE REMOVAL;  Surgeon: Newt Minion, MD;  Location: [place];  Service: Orthopedics;  Laterality: Left;  Left Below Knee Amputation  and Removal proximal screws IM tibial nail  . Amputation Right 09/08/2013    Procedure: AMPUTATION BELOW KNEE;  Surgeon: Newt Minion, MD;  Location: [place];  Service: Orthopedics;  Laterality: Right;  Right Below Knee Amputation  . Amputation Right 10/11/2013    Procedure: AMPUTATION BELOW KNEE;  Surgeon: Newt Minion, MD;  Location: [place];  Service: Orthopedics;  Laterality: Right;  Right Below Knee Amputation Revision    Outpatient Prescriptions Prior to Visit  Medication Sig Dispense Refill  . acetaminophen (TYLENOL) 500 MG tablet Take 500 mg by mouth every 6 (six) hours as needed for mild pain.    [name] amLODipine (NORVASC) 10 MG tablet Take 1 tablet (10 mg total) by mouth daily. 90 tablet 3  . calcium acetate (PHOSLO) 667 MG capsule  Take 2 capsules (1,334 mg total) by mouth 3 (three) times daily with meals. 180 capsule 0  . clopidogrel (PLAVIX) 75 MG tablet Take 1 tablet (75 mg total) by mouth daily. 90 tablet 3  . hydrALAZINE (APRESOLINE) 100 MG tablet Take 1 tablet (100 mg total) by mouth every 8 (eight) hours. 90 tablet 11  . labetalol (NORMODYNE) 300 MG tablet Take 1 tablet (300 mg total) by mouth 3 (three) times daily. 90 tablet 11  . silver sulfADIAZINE (SILVADENE) 1 % cream Apply 1 application topically daily.    . Amino Acids-Protein Hydrolys (FEEDING SUPPLEMENT, PRO-STAT SUGAR FREE 64,) LIQD Place 30 mLs into feeding tube daily at 12 noon. (Patient not taking: Reported on 01/24/2015) 900 mL 0  . Ascorbic Acid (VITAMIN C PO) Take 1 tablet by mouth daily.    . bisacodyl (DULCOLAX) 5 MG EC tablet Take 1 tablet (5 mg total) by mouth 2 (two) times daily. (Patient not taking: Reported on 01/24/2015) 30 tablet 0  . Blood Glucose Monitoring Suppl (ONE TOUCH ULTRA 2) W/DEVICE KIT  Use as directed once daily 1 each 0  . brimonidine (ALPHAGAN) 0.2 % ophthalmic solution Place 1 drop into both eyes 2 (two) times daily.    . Brinzolamide-Brimonidine (SIMBRINZA) 1-0.2 % SUSP Place 1 drop into both eyes 2 (two) times daily.     . diphenoxylate-atropine (LOMOTIL) 2.5-0.025 MG per tablet Take 1 tablet by mouth 4 (four) times daily as needed for diarrhea or loose stools. (Patient not taking: Reported on 09/03/2014) 30 tablet 0  . FLUoxetine (PROZAC) 20 MG/5ML solution Place 5 mLs (20 mg total) into feeding tube daily. (Patient not taking: Reported on 09/03/2014) 120 mL 3  . gabapentin (NEURONTIN) 300 MG capsule Take 300 mg by mouth daily as needed (for mild to moderate pain).     [name] glucose blood (ONE TOUCH ULTRA TEST) test strip Use as instructed (Patient not taking: Reported on 09/03/2014) 100 each 12  . haloperidol (HALDOL) 5 MG tablet Take 5 mg by mouth at bedtime as needed for agitation.    [name] HYDROcodone-acetaminophen (NORCO/VICODIN) 5-325 MG  per tablet Take 1 tablet by mouth every 6 (six) hours as needed for moderate pain.    . Lancets MISC Use as directed 1 per day  250.02 (Patient not taking: Reported on 09/03/2014) 100 each 11  . LORazepam (ATIVAN) 0.5 MG tablet Take 0.25 mg by mouth every 6 (six) hours as needed for anxiety.    . multivitamin (RENA-VIT) TABS tablet Take 1 tablet by mouth at bedtime. (Patient not taking: Reported on 01/24/2015) 30 tablet 0  . neomycin-bacitracin-polymyxin (NEOSPORIN) OINT Apply 1 application topically 3 (three) times daily. (Patient not taking: Reported on 09/03/2014) 1 g 0  . Nutritional Supplements (FEEDING SUPPLEMENT, NEPRO CARB STEADY,) LIQD Place 1,000 mLs into feeding tube continuous. (Patient not taking: Reported on 09/03/2014) 30 Can 0  . saccharomyces boulardii (FLORASTOR) 250 MG capsule Take 250 mg by mouth 2 (two) times daily.    . sertraline (ZOLOFT) 100 MG tablet Take 1 tablet (100 mg total) by mouth daily as needed. For depression 90 tablet 3  . sevelamer carbonate (RENVELA) 800 MG tablet Take 1,600 mg by mouth 3 (three) times daily with meals.    . Travoprost, BAK Free, (TRAVATAN) 0.004 % SOLN ophthalmic solution Place 1 drop into both eyes at bedtime. (Patient not taking: Reported on 01/24/2015) 1 Bottle 0  . traZODone (DESYREL) 50 MG tablet Take 50 mg by mouth at bedtime as needed for sleep.    . vancomycin (VANCOCIN) 125 MG capsule Take 125 mg by mouth 4 (four) times daily.    . Water For Irrigation, Sterile (FREE WATER) SOLN Place 175 mLs into feeding tube 4 (four) times daily. (Patient not taking: Reported on 09/03/2014) 1000 mL 0   No facility-administered medications prior to visit.    No Known Allergies  Family History  Problem Relation Age of Onset  . Coronary artery disease Other   . Diabetes Mother     Social History  Substance Use Topics  . Smoking status: Never Smoker   . Smokeless tobacco: Never Used  . Alcohol Use: No    ROS: As per history of present illness,  otherwise negative  BP 110/64 mmHg  Pulse 88  Ht   Wt  Constitutional: Well-developed and well-nourished. No distress. HEENT: Normocephalic and atraumatic. Oropharynx is clear and moist. No oropharyngeal exudate. Conjunctivae are normal.  No scleral icterus. Neck: Neck supple. Trachea midline. Cardiovascular: Normal rate, regular rhythm and intact distal pulses. No M/R/G Pulmonary/chest: Effort normal and  breath sounds normal. No wheezing, rales or rhonchi. Abdominal: Soft, nontender, nondistended. Bowel sounds active throughout. There are no masses palpable. Extremities: no clubbing, cyanosis, bilateral BKA with bilateral prostheses Neurological: Alert and oriented to person place and time. Skin: Skin is warm and dry. No rashes noted. Psychiatric: Normal mood and affect. Behavior is normal.  RELEVANT LABS AND IMAGING: CBC    Component Value Date/Time   WBC 6.0 07/21/2014 0717   RBC 3.41* 07/21/2014 0717   HGB 10.3* 07/21/2014 0717   HCT 30.8* 07/21/2014 0717   PLT 286 07/21/2014 0717   MCV 90.3 07/21/2014 0717   MCH 30.2 07/21/2014 0717   MCHC 33.4 07/21/2014 0717   RDW 14.4 07/21/2014 0717   LYMPHSABS 2.3 07/21/2014 0717   MONOABS 0.4 07/21/2014 0717   EOSABS 0.1 07/21/2014 0717   BASOSABS 0.0 07/21/2014 0717    CMP     Component Value Date/Time   NA 138 01/24/2015 1435   K 4.3 01/24/2015 1435   CL 95* 01/24/2015 1435   CO2 33* 01/24/2015 1435   GLUCOSE 124* 01/24/2015 1435   BUN 32* 01/24/2015 1435   CREATININE 5.33* 01/24/2015 1435   CALCIUM 8.7 01/24/2015 1435   PROT 8.5* 01/24/2015 1435   ALBUMIN 3.8 01/24/2015 1435   AST 20 01/24/2015 1435   ALT 20 01/24/2015 1435   ALKPHOS 66 01/24/2015 1435   BILITOT 0.4 01/24/2015 1435   GFRNONAA 5* 07/21/2014 0717   GFRAA 6* 07/21/2014 0717    ASSESSMENT/PLAN:  64 year old male with a past medical history of ESRD on dialysis, peripheral vascular disease status post bilateral BKA, history of CHF, CVA, bacteremia and  prolonged hospitalization in 2015, history of PEG placement 2015 since removed, history of C. difficile, hypertension and diabetes who seen in consultation at the request of Dr. [name] to evaluate diarrhea.  1. Chronic diarrhea -- intermittent but certainly now chronic. He feels symptoms are unchanged dating back to December when he was diagnosed with C. difficile. C. difficile PCR was negative in March 2016. This is the first thing to be excluded now. Repeat C. difficile PCR test. If negative could consider colonoscopy for biopsies to exclude microscopic colitis though I would expect more diarrhea every day if this were the diagnosis. Could consider empiric treatment with a course of rifaximin followed by probiotic, cholestyramine or Lomotil. Further recommendations after PCR testing  2. Colon cancer screening -- normal colonoscopy in 2014, would be due repeat colonoscopy in 2024  3. Occult GI bleeding -- occurred in 2014, no evidence for recent bleeding    CH:YIFOY Quin Hoop, Md [place] [place], [place] [id number]

## 2015-04-02 NOTE — Patient Instructions (Signed)
Your physician has requested that you go to the basement for the following lab work before leaving today: C Diff PCR  CC:Dr [name]

## 2015-04-04 DIAGNOSIS — N186 End stage renal disease: Secondary | ICD-10-CM | POA: Diagnosis not present

## 2015-04-04 DIAGNOSIS — Z23 Encounter for immunization: Secondary | ICD-10-CM | POA: Diagnosis not present

## 2015-04-04 DIAGNOSIS — E1122 Type 2 diabetes mellitus with diabetic chronic kidney disease: Secondary | ICD-10-CM | POA: Diagnosis not present

## 2015-04-04 DIAGNOSIS — N2581 Secondary hyperparathyroidism of renal origin: Secondary | ICD-10-CM | POA: Diagnosis not present

## 2015-04-06 DIAGNOSIS — N186 End stage renal disease: Secondary | ICD-10-CM | POA: Diagnosis not present

## 2015-04-06 DIAGNOSIS — N2581 Secondary hyperparathyroidism of renal origin: Secondary | ICD-10-CM | POA: Diagnosis not present

## 2015-04-06 DIAGNOSIS — E1122 Type 2 diabetes mellitus with diabetic chronic kidney disease: Secondary | ICD-10-CM | POA: Diagnosis not present

## 2015-04-06 DIAGNOSIS — Z23 Encounter for immunization: Secondary | ICD-10-CM | POA: Diagnosis not present

## 2015-04-08 ENCOUNTER — Other Ambulatory Visit: Payer: Medicare Other

## 2015-04-08 DIAGNOSIS — R197 Diarrhea, unspecified: Secondary | ICD-10-CM

## 2015-04-08 DIAGNOSIS — IMO0001 Reserved for inherently not codable concepts without codable children: Secondary | ICD-10-CM

## 2015-04-09 ENCOUNTER — Telehealth: Payer: Self-pay | Admitting: *Deleted

## 2015-04-09 DIAGNOSIS — E1122 Type 2 diabetes mellitus with diabetic chronic kidney disease: Secondary | ICD-10-CM | POA: Diagnosis not present

## 2015-04-09 DIAGNOSIS — Z23 Encounter for immunization: Secondary | ICD-10-CM | POA: Diagnosis not present

## 2015-04-09 DIAGNOSIS — N186 End stage renal disease: Secondary | ICD-10-CM | POA: Diagnosis not present

## 2015-04-09 DIAGNOSIS — N2581 Secondary hyperparathyroidism of renal origin: Secondary | ICD-10-CM | POA: Diagnosis not present

## 2015-04-09 LAB — CLOSTRIDIUM DIFFICILE BY PCR: CDIFFPCR: DETECTED — AB

## 2015-04-09 MED ORDER — VANCOMYCIN HCL 125 MG PO CAPS: 125.0000 mg | ORAL_CAPSULE | Freq: Four times a day (QID) | ORAL | Status: DC

## 2015-04-09 NOTE — Telephone Encounter (Signed)
I have spoken to patient to advise of positive C Diff. I have reiterated the importance of hand washing hygiene, as well as the need to clorox bathroom after using. I have advised that we will send vancomycin to [facility] and he should take this 4 times daily x 14 days. He will call if symptoms do not improve. I will call him back in a couple of weeks to get him scheduled to see an APP as there is not currently a schedule out for 4-6 weeks. He verbalizes understanding of all of this.

## 2015-04-09 NOTE — Telephone Encounter (Signed)
-----   Message from Jerene Bears, MD sent at 04/09/2015  3:51 PM EDT ----- C diff is positive (2nd occurrence since Dec 2015) Vanco 125 mg PO QID x 14 days Call if not improving or issues Office follow-up 4-6 weeks, me or APP

## 2015-04-11 DIAGNOSIS — E1122 Type 2 diabetes mellitus with diabetic chronic kidney disease: Secondary | ICD-10-CM | POA: Diagnosis not present

## 2015-04-11 DIAGNOSIS — Z23 Encounter for immunization: Secondary | ICD-10-CM | POA: Diagnosis not present

## 2015-04-11 DIAGNOSIS — N2581 Secondary hyperparathyroidism of renal origin: Secondary | ICD-10-CM | POA: Diagnosis not present

## 2015-04-11 DIAGNOSIS — N186 End stage renal disease: Secondary | ICD-10-CM | POA: Diagnosis not present

## 2015-04-13 DIAGNOSIS — Z23 Encounter for immunization: Secondary | ICD-10-CM | POA: Diagnosis not present

## 2015-04-13 DIAGNOSIS — N186 End stage renal disease: Secondary | ICD-10-CM | POA: Diagnosis not present

## 2015-04-13 DIAGNOSIS — E1122 Type 2 diabetes mellitus with diabetic chronic kidney disease: Secondary | ICD-10-CM | POA: Diagnosis not present

## 2015-04-13 DIAGNOSIS — N2581 Secondary hyperparathyroidism of renal origin: Secondary | ICD-10-CM | POA: Diagnosis not present

## 2015-04-16 DIAGNOSIS — N2581 Secondary hyperparathyroidism of renal origin: Secondary | ICD-10-CM | POA: Diagnosis not present

## 2015-04-16 DIAGNOSIS — E1122 Type 2 diabetes mellitus with diabetic chronic kidney disease: Secondary | ICD-10-CM | POA: Diagnosis not present

## 2015-04-16 DIAGNOSIS — N186 End stage renal disease: Secondary | ICD-10-CM | POA: Diagnosis not present

## 2015-04-16 DIAGNOSIS — Z23 Encounter for immunization: Secondary | ICD-10-CM | POA: Diagnosis not present

## 2015-04-18 DIAGNOSIS — N186 End stage renal disease: Secondary | ICD-10-CM | POA: Diagnosis not present

## 2015-04-18 DIAGNOSIS — Z23 Encounter for immunization: Secondary | ICD-10-CM | POA: Diagnosis not present

## 2015-04-18 DIAGNOSIS — E1122 Type 2 diabetes mellitus with diabetic chronic kidney disease: Secondary | ICD-10-CM | POA: Diagnosis not present

## 2015-04-18 DIAGNOSIS — N2581 Secondary hyperparathyroidism of renal origin: Secondary | ICD-10-CM | POA: Diagnosis not present

## 2015-04-19 ENCOUNTER — Telehealth: Payer: Self-pay | Admitting: Internal Medicine

## 2015-04-20 DIAGNOSIS — Z23 Encounter for immunization: Secondary | ICD-10-CM | POA: Diagnosis not present

## 2015-04-20 DIAGNOSIS — E1122 Type 2 diabetes mellitus with diabetic chronic kidney disease: Secondary | ICD-10-CM | POA: Diagnosis not present

## 2015-04-20 DIAGNOSIS — N186 End stage renal disease: Secondary | ICD-10-CM | POA: Diagnosis not present

## 2015-04-20 DIAGNOSIS — N2581 Secondary hyperparathyroidism of renal origin: Secondary | ICD-10-CM | POA: Diagnosis not present

## 2015-04-22 DIAGNOSIS — H4011X2 Primary open-angle glaucoma, moderate stage: Secondary | ICD-10-CM | POA: Diagnosis not present

## 2015-04-23 ENCOUNTER — Other Ambulatory Visit: Payer: Self-pay | Admitting: Internal Medicine

## 2015-04-23 ENCOUNTER — Telehealth: Payer: Self-pay | Admitting: Internal Medicine

## 2015-04-23 DIAGNOSIS — Z23 Encounter for immunization: Secondary | ICD-10-CM | POA: Diagnosis not present

## 2015-04-23 DIAGNOSIS — N2581 Secondary hyperparathyroidism of renal origin: Secondary | ICD-10-CM | POA: Diagnosis not present

## 2015-04-23 DIAGNOSIS — N186 End stage renal disease: Secondary | ICD-10-CM | POA: Diagnosis not present

## 2015-04-23 DIAGNOSIS — R197 Diarrhea, unspecified: Secondary | ICD-10-CM

## 2015-04-23 DIAGNOSIS — E1122 Type 2 diabetes mellitus with diabetic chronic kidney disease: Secondary | ICD-10-CM | POA: Diagnosis not present

## 2015-04-23 DIAGNOSIS — IMO0001 Reserved for inherently not codable concepts without codable children: Secondary | ICD-10-CM

## 2015-04-23 NOTE — Telephone Encounter (Signed)
Patient is scheduled for appointment with Alonza Bogus, PA-C for 05/17/15 for follow up of CDiff. Patient has taken his vancomycin 125 mg QID x 14 days. Per patient's wife, patient's stools are not as watery and are a bit more formed although still very loose. Please advise... Would you like retest for CDiff or another medication sent in?

## 2015-04-23 NOTE — Telephone Encounter (Signed)
Retest please, c diff PCR Continue florastor 250 mg BID

## 2015-04-24 ENCOUNTER — Other Ambulatory Visit: Payer: Self-pay

## 2015-04-24 NOTE — Telephone Encounter (Signed)
I have spoken to patient to advise that he needs to be retested for C Diff again. He verbalizes understanding to come pick up stool containers either today or tomorrow. I have also advised that he should start Florastor twice daily as he was not taking this previously.

## 2015-04-25 DIAGNOSIS — N2581 Secondary hyperparathyroidism of renal origin: Secondary | ICD-10-CM | POA: Diagnosis not present

## 2015-04-25 DIAGNOSIS — Z23 Encounter for immunization: Secondary | ICD-10-CM | POA: Diagnosis not present

## 2015-04-25 DIAGNOSIS — N186 End stage renal disease: Secondary | ICD-10-CM | POA: Diagnosis not present

## 2015-04-25 DIAGNOSIS — E1122 Type 2 diabetes mellitus with diabetic chronic kidney disease: Secondary | ICD-10-CM | POA: Diagnosis not present

## 2015-04-26 ENCOUNTER — Other Ambulatory Visit: Payer: Medicare Other

## 2015-04-26 DIAGNOSIS — R197 Diarrhea, unspecified: Secondary | ICD-10-CM

## 2015-04-26 DIAGNOSIS — IMO0001 Reserved for inherently not codable concepts without codable children: Secondary | ICD-10-CM

## 2015-04-26 DIAGNOSIS — E1122 Type 2 diabetes mellitus with diabetic chronic kidney disease: Secondary | ICD-10-CM | POA: Diagnosis not present

## 2015-04-26 DIAGNOSIS — N186 End stage renal disease: Secondary | ICD-10-CM | POA: Diagnosis not present

## 2015-04-26 DIAGNOSIS — Z992 Dependence on renal dialysis: Secondary | ICD-10-CM | POA: Diagnosis not present

## 2015-04-27 DIAGNOSIS — E1122 Type 2 diabetes mellitus with diabetic chronic kidney disease: Secondary | ICD-10-CM | POA: Diagnosis not present

## 2015-04-27 DIAGNOSIS — N186 End stage renal disease: Secondary | ICD-10-CM | POA: Diagnosis not present

## 2015-04-27 DIAGNOSIS — N2581 Secondary hyperparathyroidism of renal origin: Secondary | ICD-10-CM | POA: Diagnosis not present

## 2015-04-27 LAB — CLOSTRIDIUM DIFFICILE BY PCR: Toxigenic C. Difficile by PCR: NOT DETECTED

## 2015-04-30 ENCOUNTER — Telehealth: Payer: Self-pay | Admitting: Internal Medicine

## 2015-04-30 DIAGNOSIS — N2581 Secondary hyperparathyroidism of renal origin: Secondary | ICD-10-CM | POA: Diagnosis not present

## 2015-04-30 DIAGNOSIS — N186 End stage renal disease: Secondary | ICD-10-CM | POA: Diagnosis not present

## 2015-04-30 DIAGNOSIS — E1122 Type 2 diabetes mellitus with diabetic chronic kidney disease: Secondary | ICD-10-CM | POA: Diagnosis not present

## 2015-04-30 NOTE — Telephone Encounter (Signed)
Attempted to call pt. No answer and no machine to leave message.  Pts wife states pt is scheduled for cdiff follow-up appt later this month. Pts last stool test was negative and he is feeling better. Wife wants to know if he needs to keep the follow-up appt with Alonza Bogus PA. If so it needs to be rescheduled because he has dialysis that day. Please advise.

## 2015-04-30 NOTE — Telephone Encounter (Signed)
Does not need followup if all symptoms have improved Follow with his PCP otherwise

## 2015-05-01 NOTE — Telephone Encounter (Signed)
Spoke with pts wife and she is aware. Appt cancelled.

## 2015-05-02 DIAGNOSIS — E1122 Type 2 diabetes mellitus with diabetic chronic kidney disease: Secondary | ICD-10-CM | POA: Diagnosis not present

## 2015-05-02 DIAGNOSIS — N186 End stage renal disease: Secondary | ICD-10-CM | POA: Diagnosis not present

## 2015-05-02 DIAGNOSIS — N2581 Secondary hyperparathyroidism of renal origin: Secondary | ICD-10-CM | POA: Diagnosis not present

## 2015-05-04 DIAGNOSIS — N186 End stage renal disease: Secondary | ICD-10-CM | POA: Diagnosis not present

## 2015-05-04 DIAGNOSIS — E1122 Type 2 diabetes mellitus with diabetic chronic kidney disease: Secondary | ICD-10-CM | POA: Diagnosis not present

## 2015-05-04 DIAGNOSIS — N2581 Secondary hyperparathyroidism of renal origin: Secondary | ICD-10-CM | POA: Diagnosis not present

## 2015-05-06 ENCOUNTER — Telehealth: Payer: Self-pay | Admitting: Internal Medicine

## 2015-05-06 DIAGNOSIS — Z449 Encounter for fitting and adjustment of unspecified external prosthetic device: Secondary | ICD-10-CM

## 2015-05-06 DIAGNOSIS — R269 Unspecified abnormalities of gait and mobility: Secondary | ICD-10-CM

## 2015-05-06 NOTE — Telephone Encounter (Signed)
Pt request order PT to help him learn how to walk with prothesis. Please advise.

## 2015-05-07 DIAGNOSIS — N2581 Secondary hyperparathyroidism of renal origin: Secondary | ICD-10-CM | POA: Diagnosis not present

## 2015-05-07 DIAGNOSIS — E1122 Type 2 diabetes mellitus with diabetic chronic kidney disease: Secondary | ICD-10-CM | POA: Diagnosis not present

## 2015-05-07 DIAGNOSIS — N186 End stage renal disease: Secondary | ICD-10-CM | POA: Diagnosis not present

## 2015-05-07 NOTE — Telephone Encounter (Signed)
Called pt yes he is wanting outpatient PT.../lmb

## 2015-05-07 NOTE — Telephone Encounter (Signed)
Please clarify, as I think he means Outpatient PT, and not home PT, thanks  Home PT requires that the patient be home bound without assistance to be out of the home

## 2015-05-09 DIAGNOSIS — N186 End stage renal disease: Secondary | ICD-10-CM | POA: Diagnosis not present

## 2015-05-09 NOTE — Telephone Encounter (Signed)
Called pt no answer LMOM md place PT referral.../lmb

## 2015-05-10 DIAGNOSIS — F1021 Alcohol dependence, in remission: Secondary | ICD-10-CM | POA: Insufficient documentation

## 2015-05-10 DIAGNOSIS — Z8711 Personal history of peptic ulcer disease: Secondary | ICD-10-CM | POA: Insufficient documentation

## 2015-05-10 DIAGNOSIS — J42 Unspecified chronic bronchitis: Secondary | ICD-10-CM | POA: Insufficient documentation

## 2015-05-11 DIAGNOSIS — N186 End stage renal disease: Secondary | ICD-10-CM | POA: Diagnosis not present

## 2015-05-14 ENCOUNTER — Ambulatory Visit: Payer: Self-pay | Admitting: Gastroenterology

## 2015-05-14 DIAGNOSIS — N186 End stage renal disease: Secondary | ICD-10-CM | POA: Diagnosis not present

## 2015-05-16 DIAGNOSIS — N2581 Secondary hyperparathyroidism of renal origin: Secondary | ICD-10-CM | POA: Diagnosis not present

## 2015-05-16 DIAGNOSIS — E1122 Type 2 diabetes mellitus with diabetic chronic kidney disease: Secondary | ICD-10-CM | POA: Diagnosis not present

## 2015-05-16 DIAGNOSIS — N186 End stage renal disease: Secondary | ICD-10-CM | POA: Diagnosis not present

## 2015-05-17 ENCOUNTER — Ambulatory Visit: Payer: Self-pay | Admitting: Gastroenterology

## 2015-05-17 DIAGNOSIS — I5042 Chronic combined systolic (congestive) and diastolic (congestive) heart failure: Secondary | ICD-10-CM | POA: Diagnosis not present

## 2015-05-17 DIAGNOSIS — IMO0001 Reserved for inherently not codable concepts without codable children: Secondary | ICD-10-CM | POA: Insufficient documentation

## 2015-05-17 DIAGNOSIS — E1151 Type 2 diabetes mellitus with diabetic peripheral angiopathy without gangrene: Secondary | ICD-10-CM | POA: Insufficient documentation

## 2015-05-17 DIAGNOSIS — E113599 Type 2 diabetes mellitus with proliferative diabetic retinopathy without macular edema, unspecified eye: Secondary | ICD-10-CM

## 2015-05-17 DIAGNOSIS — Z794 Long term (current) use of insulin: Secondary | ICD-10-CM

## 2015-05-17 DIAGNOSIS — D638 Anemia in other chronic diseases classified elsewhere: Secondary | ICD-10-CM | POA: Diagnosis not present

## 2015-05-17 DIAGNOSIS — Z7682 Awaiting organ transplant status: Secondary | ICD-10-CM | POA: Diagnosis not present

## 2015-05-17 DIAGNOSIS — Z1159 Encounter for screening for other viral diseases: Secondary | ICD-10-CM | POA: Diagnosis not present

## 2015-05-17 DIAGNOSIS — I10 Essential (primary) hypertension: Secondary | ICD-10-CM | POA: Diagnosis not present

## 2015-05-17 DIAGNOSIS — E119 Type 2 diabetes mellitus without complications: Secondary | ICD-10-CM | POA: Diagnosis not present

## 2015-05-17 DIAGNOSIS — N186 End stage renal disease: Secondary | ICD-10-CM | POA: Diagnosis not present

## 2015-05-17 DIAGNOSIS — Z125 Encounter for screening for malignant neoplasm of prostate: Secondary | ICD-10-CM | POA: Diagnosis not present

## 2015-05-17 DIAGNOSIS — Z01818 Encounter for other preprocedural examination: Secondary | ICD-10-CM | POA: Diagnosis not present

## 2015-05-17 DIAGNOSIS — Z89512 Acquired absence of left leg below knee: Secondary | ICD-10-CM | POA: Diagnosis not present

## 2015-05-17 DIAGNOSIS — Z992 Dependence on renal dialysis: Secondary | ICD-10-CM | POA: Diagnosis not present

## 2015-05-17 DIAGNOSIS — Z89511 Acquired absence of right leg below knee: Secondary | ICD-10-CM | POA: Diagnosis not present

## 2015-05-17 DIAGNOSIS — F329 Major depressive disorder, single episode, unspecified: Secondary | ICD-10-CM | POA: Diagnosis not present

## 2015-05-17 DIAGNOSIS — E785 Hyperlipidemia, unspecified: Secondary | ICD-10-CM | POA: Diagnosis not present

## 2015-05-18 DIAGNOSIS — E1122 Type 2 diabetes mellitus with diabetic chronic kidney disease: Secondary | ICD-10-CM | POA: Diagnosis not present

## 2015-05-18 DIAGNOSIS — N186 End stage renal disease: Secondary | ICD-10-CM | POA: Diagnosis not present

## 2015-05-18 DIAGNOSIS — N2581 Secondary hyperparathyroidism of renal origin: Secondary | ICD-10-CM | POA: Diagnosis not present

## 2015-05-21 DIAGNOSIS — N186 End stage renal disease: Secondary | ICD-10-CM | POA: Diagnosis not present

## 2015-05-21 DIAGNOSIS — N2581 Secondary hyperparathyroidism of renal origin: Secondary | ICD-10-CM | POA: Diagnosis not present

## 2015-05-21 DIAGNOSIS — E1122 Type 2 diabetes mellitus with diabetic chronic kidney disease: Secondary | ICD-10-CM | POA: Diagnosis not present

## 2015-05-23 DIAGNOSIS — N2581 Secondary hyperparathyroidism of renal origin: Secondary | ICD-10-CM | POA: Diagnosis not present

## 2015-05-23 DIAGNOSIS — E1122 Type 2 diabetes mellitus with diabetic chronic kidney disease: Secondary | ICD-10-CM | POA: Diagnosis not present

## 2015-05-23 DIAGNOSIS — N186 End stage renal disease: Secondary | ICD-10-CM | POA: Diagnosis not present

## 2015-05-25 DIAGNOSIS — E1122 Type 2 diabetes mellitus with diabetic chronic kidney disease: Secondary | ICD-10-CM | POA: Diagnosis not present

## 2015-05-25 DIAGNOSIS — N2581 Secondary hyperparathyroidism of renal origin: Secondary | ICD-10-CM | POA: Diagnosis not present

## 2015-05-25 DIAGNOSIS — N186 End stage renal disease: Secondary | ICD-10-CM | POA: Diagnosis not present

## 2015-05-27 DIAGNOSIS — E1122 Type 2 diabetes mellitus with diabetic chronic kidney disease: Secondary | ICD-10-CM | POA: Diagnosis not present

## 2015-05-27 DIAGNOSIS — Z992 Dependence on renal dialysis: Secondary | ICD-10-CM | POA: Diagnosis not present

## 2015-05-27 DIAGNOSIS — N186 End stage renal disease: Secondary | ICD-10-CM | POA: Diagnosis not present

## 2015-05-28 DIAGNOSIS — N186 End stage renal disease: Secondary | ICD-10-CM | POA: Diagnosis not present

## 2015-05-28 DIAGNOSIS — N2581 Secondary hyperparathyroidism of renal origin: Secondary | ICD-10-CM | POA: Diagnosis not present

## 2015-05-28 DIAGNOSIS — E1122 Type 2 diabetes mellitus with diabetic chronic kidney disease: Secondary | ICD-10-CM | POA: Diagnosis not present

## 2015-05-30 DIAGNOSIS — E1122 Type 2 diabetes mellitus with diabetic chronic kidney disease: Secondary | ICD-10-CM | POA: Diagnosis not present

## 2015-05-30 DIAGNOSIS — N186 End stage renal disease: Secondary | ICD-10-CM | POA: Diagnosis not present

## 2015-05-30 DIAGNOSIS — N2581 Secondary hyperparathyroidism of renal origin: Secondary | ICD-10-CM | POA: Diagnosis not present

## 2015-06-01 DIAGNOSIS — N2581 Secondary hyperparathyroidism of renal origin: Secondary | ICD-10-CM | POA: Diagnosis not present

## 2015-06-01 DIAGNOSIS — E1122 Type 2 diabetes mellitus with diabetic chronic kidney disease: Secondary | ICD-10-CM | POA: Diagnosis not present

## 2015-06-01 DIAGNOSIS — N186 End stage renal disease: Secondary | ICD-10-CM | POA: Diagnosis not present

## 2015-06-04 DIAGNOSIS — N2581 Secondary hyperparathyroidism of renal origin: Secondary | ICD-10-CM | POA: Diagnosis not present

## 2015-06-04 DIAGNOSIS — E1122 Type 2 diabetes mellitus with diabetic chronic kidney disease: Secondary | ICD-10-CM | POA: Diagnosis not present

## 2015-06-04 DIAGNOSIS — N186 End stage renal disease: Secondary | ICD-10-CM | POA: Diagnosis not present

## 2015-06-06 DIAGNOSIS — E1122 Type 2 diabetes mellitus with diabetic chronic kidney disease: Secondary | ICD-10-CM | POA: Diagnosis not present

## 2015-06-06 DIAGNOSIS — N186 End stage renal disease: Secondary | ICD-10-CM | POA: Diagnosis not present

## 2015-06-06 DIAGNOSIS — M545 Low back pain: Secondary | ICD-10-CM

## 2015-06-06 DIAGNOSIS — G8929 Other chronic pain: Secondary | ICD-10-CM | POA: Insufficient documentation

## 2015-06-06 DIAGNOSIS — F17210 Nicotine dependence, cigarettes, uncomplicated: Secondary | ICD-10-CM | POA: Insufficient documentation

## 2015-06-06 DIAGNOSIS — N2581 Secondary hyperparathyroidism of renal origin: Secondary | ICD-10-CM | POA: Diagnosis not present

## 2015-06-06 DIAGNOSIS — E669 Obesity, unspecified: Secondary | ICD-10-CM | POA: Insufficient documentation

## 2015-06-08 DIAGNOSIS — N2581 Secondary hyperparathyroidism of renal origin: Secondary | ICD-10-CM | POA: Diagnosis not present

## 2015-06-08 DIAGNOSIS — E1122 Type 2 diabetes mellitus with diabetic chronic kidney disease: Secondary | ICD-10-CM | POA: Diagnosis not present

## 2015-06-08 DIAGNOSIS — N186 End stage renal disease: Secondary | ICD-10-CM | POA: Diagnosis not present

## 2015-06-10 DIAGNOSIS — Z992 Dependence on renal dialysis: Secondary | ICD-10-CM | POA: Diagnosis not present

## 2015-06-10 DIAGNOSIS — T82858D Stenosis of vascular prosthetic devices, implants and grafts, subsequent encounter: Secondary | ICD-10-CM | POA: Diagnosis not present

## 2015-06-10 DIAGNOSIS — N186 End stage renal disease: Secondary | ICD-10-CM | POA: Diagnosis not present

## 2015-06-10 DIAGNOSIS — I871 Compression of vein: Secondary | ICD-10-CM | POA: Diagnosis not present

## 2015-06-11 DIAGNOSIS — E1122 Type 2 diabetes mellitus with diabetic chronic kidney disease: Secondary | ICD-10-CM | POA: Diagnosis not present

## 2015-06-11 DIAGNOSIS — N2581 Secondary hyperparathyroidism of renal origin: Secondary | ICD-10-CM | POA: Diagnosis not present

## 2015-06-11 DIAGNOSIS — N186 End stage renal disease: Secondary | ICD-10-CM | POA: Diagnosis not present

## 2015-06-12 ENCOUNTER — Telehealth: Payer: Self-pay | Admitting: Internal Medicine

## 2015-06-13 ENCOUNTER — Encounter: Payer: Self-pay | Admitting: Internal Medicine

## 2015-06-13 ENCOUNTER — Other Ambulatory Visit (INDEPENDENT_AMBULATORY_CARE_PROVIDER_SITE_OTHER): Payer: Medicare Other

## 2015-06-13 ENCOUNTER — Ambulatory Visit (INDEPENDENT_AMBULATORY_CARE_PROVIDER_SITE_OTHER): Payer: Medicare Other | Admitting: Internal Medicine

## 2015-06-13 VITALS — BP 140/80 | HR 83 | Wt 180.0 lb

## 2015-06-13 DIAGNOSIS — E785 Hyperlipidemia, unspecified: Secondary | ICD-10-CM

## 2015-06-13 DIAGNOSIS — B1920 Unspecified viral hepatitis C without hepatic coma: Secondary | ICD-10-CM

## 2015-06-13 DIAGNOSIS — I639 Cerebral infarction, unspecified: Secondary | ICD-10-CM | POA: Diagnosis not present

## 2015-06-13 DIAGNOSIS — N186 End stage renal disease: Secondary | ICD-10-CM | POA: Diagnosis not present

## 2015-06-13 DIAGNOSIS — E1022 Type 1 diabetes mellitus with diabetic chronic kidney disease: Secondary | ICD-10-CM

## 2015-06-13 DIAGNOSIS — I10 Essential (primary) hypertension: Secondary | ICD-10-CM

## 2015-06-13 DIAGNOSIS — E1122 Type 2 diabetes mellitus with diabetic chronic kidney disease: Secondary | ICD-10-CM | POA: Diagnosis not present

## 2015-06-13 DIAGNOSIS — N184 Chronic kidney disease, stage 4 (severe): Secondary | ICD-10-CM | POA: Diagnosis not present

## 2015-06-13 DIAGNOSIS — N2581 Secondary hyperparathyroidism of renal origin: Secondary | ICD-10-CM | POA: Diagnosis not present

## 2015-06-13 LAB — CBC WITH DIFFERENTIAL/PLATELET
Basophils Absolute: 0 10*3/uL (ref 0.0–0.1)
Basophils Relative: 0.4 % (ref 0.0–3.0)
EOS PCT: 1.2 % (ref 0.0–5.0)
Eosinophils Absolute: 0.1 10*3/uL (ref 0.0–0.7)
HCT: 37.4 % — ABNORMAL LOW (ref 39.0–52.0)
HEMOGLOBIN: 12.3 g/dL — AB (ref 13.0–17.0)
Lymphocytes Relative: 31.9 % (ref 12.0–46.0)
Lymphs Abs: 1.6 10*3/uL (ref 0.7–4.0)
MCHC: 32.9 g/dL (ref 30.0–36.0)
MCV: 90.3 fl (ref 78.0–100.0)
MONOS PCT: 10.2 % (ref 3.0–12.0)
Monocytes Absolute: 0.5 10*3/uL (ref 0.1–1.0)
NEUTROS PCT: 56.3 % (ref 43.0–77.0)
Neutro Abs: 2.7 10*3/uL (ref 1.4–7.7)
PLATELETS: 276 10*3/uL (ref 150.0–400.0)
RBC: 4.14 Mil/uL — AB (ref 4.22–5.81)
RDW: 15.5 % (ref 11.5–15.5)
WBC: 4.9 10*3/uL (ref 4.0–10.5)

## 2015-06-13 LAB — HEPATIC FUNCTION PANEL
ALBUMIN: 4 g/dL (ref 3.5–5.2)
ALK PHOS: 62 U/L (ref 39–117)
ALT: 22 U/L (ref 0–53)
AST: 19 U/L (ref 0–37)
BILIRUBIN DIRECT: 0.1 mg/dL (ref 0.0–0.3)
TOTAL PROTEIN: 8.4 g/dL — AB (ref 6.0–8.3)
Total Bilirubin: 0.5 mg/dL (ref 0.2–1.2)

## 2015-06-13 LAB — LIPID PANEL
CHOLESTEROL: 159 mg/dL (ref 0–200)
HDL: 37 mg/dL — AB (ref >39.00)
LDL CALC: 105 mg/dL — AB (ref 0–99)
NonHDL: 122.47
Total CHOL/HDL Ratio: 4
Triglycerides: 86 mg/dL (ref 0.0–149.0)
VLDL: 17.2 mg/dL (ref 0.0–40.0)

## 2015-06-13 LAB — BASIC METABOLIC PANEL
BUN: 20 mg/dL (ref 6–23)
CALCIUM: 9.2 mg/dL (ref 8.4–10.5)
CO2: 30 meq/L (ref 19–32)
Chloride: 95 mEq/L — ABNORMAL LOW (ref 96–112)
Creatinine, Ser: 4.41 mg/dL — ABNORMAL HIGH (ref 0.40–1.50)
GFR: 17.47 mL/min — AB (ref >60.00)
GLUCOSE: 98 mg/dL (ref 70–99)
POTASSIUM: 4.2 meq/L (ref 3.5–5.1)
SODIUM: 136 meq/L (ref 135–145)

## 2015-06-13 LAB — TSH: TSH: 0.48 u[IU]/mL (ref 0.35–4.50)

## 2015-06-13 LAB — HEMOGLOBIN A1C: HEMOGLOBIN A1C: 5.8 % (ref 4.6–6.5)

## 2015-06-13 NOTE — Progress Notes (Signed)
Subjective:    Patient ID: Oscar Castillo, male    DOB: 03-31-1951, 64 y.o.   MRN: [id number]  HPI  Here for yearly f/u;  Overall doing ok;  Pt denies Chest pain, worsening SOB, DOE, wheezing, orthopnea, PND, worsening LE edema, palpitations, dizziness or syncope.  Pt denies neurological change such as new headache, facial or extremity weakness.  Pt denies polydipsia, polyuria, or low sugar symptoms. Pt states overall good compliance with treatment and medications, good tolerability, and has been trying to follow appropriate diet.  Pt denies worsening depressive symptoms, suicidal ideation or panic. No fever, night sweats, wt loss, loss of appetite, or other constitutional symptoms.  Pt states good ability with ADL's, has low fall risk, home safety reviewed and adequate, no other significant changes in hearing or vision  Was at [facility] for renal transplant evaluation, found Hep C +, referred here for discussion.  Declines prevnar Past Medical History  Diagnosis Date  . ESRD on hemodialysis ([place]) 05/05/2007    ESRD due to DM/HTN. Started dialysis in November 2013.  HD TTS at [facility] on [place].  [name] BACK PAIN, LUMBAR, CHRONIC 08/06/2009  . BENIGN PROSTATIC HYPERTROPHY 08/01/2010  . CEREBROVASCULAR ACCIDENT, HX OF 08/06/2009  . CHOLELITHIASIS 08/01/2010  . CONGESTIVE HEART FAILURE 03/18/2009  . DEPRESSION 03/18/2009  . DIABETES MELLITUS, TYPE II 03/25/2007  . ERECTILE DYSFUNCTION 03/25/2007  . GERD 03/25/2007  . HEPATITIS C, HX OF 03/25/2007  . HYPERTENSION 03/25/2007  . Morbid obesity ([place]) 03/25/2007  . NEPHROLITHIASIS, HX OF 03/18/2009  . Complication of anesthesia     wife states pt had trouble waking up with his last surgery in Nov., 2014  . History of Clostridium difficile   . Hemorrhoids   . Anemia   . Antral ulcer 2014    small   Past Surgical History  Procedure Laterality Date  . Nephrectomy      partial RR  . Av fistula placement  06/14/2012    Procedure: ARTERIOVENOUS (AV) FISTULA  CREATION;  Surgeon: Angelia Mould, MD;  Location: [facility] OR;  Service: Vascular;  Laterality: Left;  Left basilic vein transposition with fistula.  . Tibia im nail insertion Left 09/09/2012    Procedure: INTRAMEDULLARY (IM) NAIL TIBIAL;  Surgeon: Johnny Bridge, MD;  Location: [place];  Service: Orthopedics;  Laterality: Left;  left tibial nail and open reduction internal fixation left fibula fracture  . Orif fibula fracture Left 09/09/2012    Procedure: OPEN REDUCTION INTERNAL FIXATION (ORIF) FIBULA FRACTURE;  Surgeon: Johnny Bridge, MD;  Location: [place];  Service: Orthopedics;  Laterality: Left;  . Colonoscopy N/A 10/28/2012    Procedure: COLONOSCOPY;  Surgeon: Jeryl Columbia, MD;  Location: [facility] ENDOSCOPY;  Service: Endoscopy;  Laterality: N/A;  . Esophagogastroduodenoscopy N/A 11/02/2012    Procedure: ESOPHAGOGASTRODUODENOSCOPY (EGD);  Surgeon: Cleotis Nipper, MD;  Location[facility] ENDOSCOPY;  Service: Endoscopy;  Laterality: N/A;  . Colonoscopy N/A 11/02/2012    Procedure: COLONOSCOPY;  Surgeon: Cleotis Nipper, MD;  Location: [facility] ENDOSCOPY;  Service: Endoscopy;  Laterality: N/A;  . Colonoscopy N/A 11/03/2012    Procedure: COLONOSCOPY;  Surgeon: Cleotis Nipper, MD;  Location: [facility] ENDOSCOPY;  Service: Endoscopy;  Laterality: N/A;  . Givens capsule study N/A 11/04/2012    Procedure: GIVENS CAPSULE STUDY;  Surgeon: Cleotis Nipper, MD;  Location: [facility] ENDOSCOPY;  Service: Endoscopy;  Laterality: N/A;  . Enteroscopy N/A 11/08/2012    Procedure: ENTEROSCOPY;  Surgeon: Wonda Horner, MD;  Location: [facility] ENDOSCOPY;  Service: Endoscopy;  Laterality: N/A;  . Amputation Left 05/12/2013    Procedure: AMPUTATION RAY;  Surgeon: Newt Minion, MD;  Location: [place];  Service: Orthopedics;  Laterality: Left;  Left Foot 1st Ray Amputation  . Eye surgery Left     to remove scar tissue  . Amputation Left 06/09/2013    Procedure: AMPUTATION BELOW KNEE;  Surgeon: Newt Minion, MD;  Location: [place];  Service: Orthopedics;   Laterality: Left;  Left Below Knee Amputation and removal proximal screws IM tibial nail  . Hardware removal Left 06/09/2013    Procedure: HARDWARE REMOVAL;  Surgeon: Newt Minion, MD;  Location: [place];  Service: Orthopedics;  Laterality: Left;  Left Below Knee Amputation  and Removal proximal screws IM tibial nail  . Amputation Right 09/08/2013    Procedure: AMPUTATION BELOW KNEE;  Surgeon: Newt Minion, MD;  Location: [place];  Service: Orthopedics;  Laterality: Right;  Right Below Knee Amputation  . Amputation Right 10/11/2013    Procedure: AMPUTATION BELOW KNEE;  Surgeon: Newt Minion, MD;  Location: [place];  Service: Orthopedics;  Laterality: Right;  Right Below Knee Amputation Revision    reports that he has never smoked. He has never used smokeless tobacco. He reports that he does not drink alcohol or use illicit drugs. family history includes Coronary artery disease in his other; Diabetes in his mother. No Known Allergies Current Outpatient Prescriptions on File Prior to Visit  Medication Sig Dispense Refill  . acetaminophen (TYLENOL) 500 MG tablet Take 500 mg by mouth every 6 (six) hours as needed for mild pain.    [name] amLODipine (NORVASC) 10 MG tablet Take 1 tablet (10 mg total) by mouth daily. 90 tablet 3  . calcium acetate (PHOSLO) 667 MG capsule Take 2 capsules (1,334 mg total) by mouth 3 (three) times daily with meals. 180 capsule 0  . clopidogrel (PLAVIX) 75 MG tablet Take 1 tablet (75 mg total) by mouth daily. 90 tablet 3  . hydrALAZINE (APRESOLINE) 100 MG tablet Take 1 tablet (100 mg total) by mouth every 8 (eight) hours. 90 tablet 11  . labetalol (NORMODYNE) 300 MG tablet Take 1 tablet (300 mg total) by mouth 3 (three) times daily. 90 tablet 11  . SENSIPAR 30 MG tablet Take 1 tablet by mouth every evening.    . silver sulfADIAZINE (SILVADENE) 1 % cream Apply 1 application topically daily.    . vancomycin (VANCOCIN) 125 MG capsule Take 1 capsule (125 mg total) by mouth 4 (four)  times daily. 56 capsule 0   No current facility-administered medications on file prior to visit.    Review of Systems Constitutional: Negative for increased diaphoresis, other activity, appetite or siginficant weight change other than noted HENT: Negative for worsening hearing loss, ear pain, facial swelling, mouth sores and neck stiffness.   Eyes: Negative for other worsening pain, redness or visual disturbance.  Respiratory: Negative for shortness of breath and wheezing  Cardiovascular: Negative for chest pain and palpitations.  Gastrointestinal: Negative for diarrhea, blood in stool, abdominal distention or other pain Genitourinary: Negative for hematuria, flank pain or change in urine volume.  Musculoskeletal: Negative for myalgias or other joint complaints.  Skin: Negative for color change and wound or drainage.  Neurological: Negative for syncope and numbness. other than noted Hematological: Negative for adenopathy. or other swelling Psychiatric/Behavioral: Negative for hallucinations, SI, self-injury, decreased concentration or other worsening agitation.      Objective:   Physical  Exam BP 140/80 mmHg  Pulse 83  Wt 180 lb (81.647 kg)  SpO2 98% VS noted,  Constitutional: Pt is oriented to person, place, and time. Appears well-developed and well-nourished, in no significant distress Head: Normocephalic and atraumatic.  Right Ear: External ear normal.  Left Ear: External ear normal.  Nose: Nose normal.  Mouth/Throat: Oropharynx is clear and moist.  Eyes: Conjunctivae and EOM are normal. Pupils are equal, round, and reactive to light.  Neck: Normal range of motion. Neck supple. No JVD present. No tracheal deviation present or significant neck LA or mass Cardiovascular: Normal rate, regular rhythm, normal heart sounds and intact distal pulses.   Pulmonary/Chest: Effort normal and breath sounds without rales or wheezing  Abdominal: Soft. Bowel sounds are normal. NT. No HSM    Musculoskeletal: Normal range of motion. Exhibits no edema.  Lymphadenopathy:  Has no cervical adenopathy.  Neurological: Pt is alert and oriented to person, place, and time. Pt has normal reflexes. No cranial nerve deficit. Motor grossly intact Skin: Skin is warm and dry. No rash noted.  Psychiatric:  Has normal mood and affect. Behavior is normal.  S/ p bilat BKA    Assessment & Plan:

## 2015-06-13 NOTE — Progress Notes (Signed)
Pre visit review using our clinic review tool, if applicable. No additional management support is needed unless otherwise documented below in the visit note. 

## 2015-06-13 NOTE — Patient Instructions (Signed)
Please continue all other medications as before, and refills have been done if requested.  Please have the pharmacy call with any other refills you may need.  Please continue your efforts at being more active, low cholesterol diet, and weight control.  You are otherwise up to date with prevention measures today.  Please keep your appointments with your specialists as you may have planned  You are given the information about the Hepatitis C infection  You will be contacted regarding the referral for: Infectious Disease  Please go to the LAB in the Basement (turn left off the elevator) for the tests to be done today  You will be contacted by phone if any changes need to be made immediately.  Otherwise, you will receive a letter about your results with an explanation, but please check with MyChart first.  Please remember to sign up for MyChart if you have not done so, as this will be important to you in the future with finding out test results, communicating by private email, and scheduling acute appointments online when needed.  Please return in 6 months, or sooner if needed  (ok to cancel the dec 2016 appointment)

## 2015-06-15 DIAGNOSIS — N2581 Secondary hyperparathyroidism of renal origin: Secondary | ICD-10-CM | POA: Diagnosis not present

## 2015-06-15 DIAGNOSIS — E1122 Type 2 diabetes mellitus with diabetic chronic kidney disease: Secondary | ICD-10-CM | POA: Diagnosis not present

## 2015-06-15 DIAGNOSIS — N186 End stage renal disease: Secondary | ICD-10-CM | POA: Diagnosis not present

## 2015-06-15 NOTE — Assessment & Plan Note (Signed)
stable overall by history and exam, recent data reviewed with pt, and pt to continue medical treatment as before,  to f/u any worsening symptoms or concerns BP Readings from Last 3 Encounters:  06/13/15 140/80  04/02/15 110/64  01/24/15 110/74

## 2015-06-15 NOTE — Assessment & Plan Note (Addendum)
For genotype and hep c quant rna,  to f/u any worsening symptoms or concerns, for ID referral per pt reqeust

## 2015-06-15 NOTE — Assessment & Plan Note (Signed)
stable overall by history and exam, recent data reviewed with pt, and pt to continue medical treatment as before,  to f/u any worsening symptoms or concerns Lab Results  Component Value Date   LDLCALC 105* 06/13/2015

## 2015-06-15 NOTE — Assessment & Plan Note (Signed)
stable overall by history and exam, recent data reviewed with pt, and pt to continue medical treatment as before,  to f/u any worsening symptoms or concerns,  Lab Results  Component Value Date   HGBA1C 5.8 06/13/2015

## 2015-06-18 DIAGNOSIS — N186 End stage renal disease: Secondary | ICD-10-CM | POA: Diagnosis not present

## 2015-06-18 DIAGNOSIS — E1122 Type 2 diabetes mellitus with diabetic chronic kidney disease: Secondary | ICD-10-CM | POA: Diagnosis not present

## 2015-06-18 DIAGNOSIS — N2581 Secondary hyperparathyroidism of renal origin: Secondary | ICD-10-CM | POA: Diagnosis not present

## 2015-06-18 LAB — HEPATITIS C RNA QUANTITATIVE
HCV QUANT LOG: 5.53 {Log} — AB (ref <1.18)
HCV QUANT: 335365 [IU]/mL — AB (ref <15)

## 2015-06-21 DIAGNOSIS — N186 End stage renal disease: Secondary | ICD-10-CM | POA: Diagnosis not present

## 2015-06-21 DIAGNOSIS — E1122 Type 2 diabetes mellitus with diabetic chronic kidney disease: Secondary | ICD-10-CM | POA: Diagnosis not present

## 2015-06-21 DIAGNOSIS — N2581 Secondary hyperparathyroidism of renal origin: Secondary | ICD-10-CM | POA: Diagnosis not present

## 2015-06-23 DIAGNOSIS — N2581 Secondary hyperparathyroidism of renal origin: Secondary | ICD-10-CM | POA: Diagnosis not present

## 2015-06-23 DIAGNOSIS — E1122 Type 2 diabetes mellitus with diabetic chronic kidney disease: Secondary | ICD-10-CM | POA: Diagnosis not present

## 2015-06-23 DIAGNOSIS — N186 End stage renal disease: Secondary | ICD-10-CM | POA: Diagnosis not present

## 2015-06-24 ENCOUNTER — Telehealth: Payer: Self-pay | Admitting: *Deleted

## 2015-06-24 NOTE — Telephone Encounter (Signed)
Ok to let pt know, all labs OK, and Hep C was still positive and appears to be an active infection (chronic)  He should hear soon about referral to ID clinic for Hep C; no other change in tx needed

## 2015-06-24 NOTE — Telephone Encounter (Signed)
Received call pt states he never received lab results from last week. Inform pt per chart md has release labs to the mychart sxs. Pt states he doesn't work the [employer]. He never signed up. Inform will send msg to md to get results, also deactivated mychart per pt request.../lmb

## 2015-06-25 DIAGNOSIS — E1122 Type 2 diabetes mellitus with diabetic chronic kidney disease: Secondary | ICD-10-CM | POA: Diagnosis not present

## 2015-06-25 DIAGNOSIS — N186 End stage renal disease: Secondary | ICD-10-CM | POA: Diagnosis not present

## 2015-06-25 DIAGNOSIS — N2581 Secondary hyperparathyroidism of renal origin: Secondary | ICD-10-CM | POA: Diagnosis not present

## 2015-06-25 NOTE — Telephone Encounter (Signed)
Called pt wife stated he was at dialysis, but can give results to her. Gave her md response...Johny Chess

## 2015-06-26 ENCOUNTER — Ambulatory Visit: Payer: Medicare Other | Attending: Internal Medicine | Admitting: Physical Therapy

## 2015-06-26 ENCOUNTER — Encounter: Payer: Self-pay | Admitting: Physical Therapy

## 2015-06-26 DIAGNOSIS — R29818 Other symptoms and signs involving the nervous system: Secondary | ICD-10-CM | POA: Diagnosis not present

## 2015-06-26 DIAGNOSIS — E1122 Type 2 diabetes mellitus with diabetic chronic kidney disease: Secondary | ICD-10-CM | POA: Diagnosis not present

## 2015-06-26 DIAGNOSIS — R269 Unspecified abnormalities of gait and mobility: Secondary | ICD-10-CM | POA: Insufficient documentation

## 2015-06-26 DIAGNOSIS — Z89511 Acquired absence of right leg below knee: Secondary | ICD-10-CM | POA: Diagnosis not present

## 2015-06-26 DIAGNOSIS — R531 Weakness: Secondary | ICD-10-CM | POA: Diagnosis not present

## 2015-06-26 DIAGNOSIS — Z7409 Other reduced mobility: Secondary | ICD-10-CM

## 2015-06-26 DIAGNOSIS — M24669 Ankylosis, unspecified knee: Secondary | ICD-10-CM | POA: Insufficient documentation

## 2015-06-26 DIAGNOSIS — R2991 Unspecified symptoms and signs involving the musculoskeletal system: Secondary | ICD-10-CM | POA: Diagnosis not present

## 2015-06-26 DIAGNOSIS — Z89512 Acquired absence of left leg below knee: Secondary | ICD-10-CM | POA: Insufficient documentation

## 2015-06-26 DIAGNOSIS — R2689 Other abnormalities of gait and mobility: Secondary | ICD-10-CM

## 2015-06-26 DIAGNOSIS — R6889 Other general symptoms and signs: Secondary | ICD-10-CM | POA: Insufficient documentation

## 2015-06-26 DIAGNOSIS — Z992 Dependence on renal dialysis: Secondary | ICD-10-CM | POA: Diagnosis not present

## 2015-06-26 DIAGNOSIS — N186 End stage renal disease: Secondary | ICD-10-CM | POA: Diagnosis not present

## 2015-06-26 NOTE — Therapy (Signed)
[facility] [address] [place], [place], [address] Phone: [phone]   Fax:  [phone]  Physical Therapy Evaluation  Patient Details  Name: Oscar Castillo MRN: [id number] Date of Birth: 03/22/1951 Referring Provider: [name], MD  Encounter Date: 06/26/2015      PT End of Session - 06/26/15 1015    Visit Number 1   Number of Visits 26   Date for PT Re-Evaluation 09/20/15   Authorization Type Medicare Do G-Code & progress reports every 10 visits   PT Start Time 0933   PT Stop Time 1015   PT Time Calculation (min) 42 min   Equipment Utilized During Treatment Gait belt   Activity Tolerance Patient tolerated treatment well   Behavior During Therapy [facility] for tasks assessed/performed      Past Medical History  Diagnosis Date  . ESRD on hemodialysis ([place]) 05/05/2007    ESRD due to DM/HTN. Started dialysis in November 2013.  HD TTS at [facility] on [place].  [name] BACK PAIN, LUMBAR, CHRONIC 08/06/2009  . BENIGN PROSTATIC HYPERTROPHY 08/01/2010  . CEREBROVASCULAR ACCIDENT, HX OF 08/06/2009  . CHOLELITHIASIS 08/01/2010  . CONGESTIVE HEART FAILURE 03/18/2009  . DEPRESSION 03/18/2009  . DIABETES MELLITUS, TYPE II 03/25/2007  . ERECTILE DYSFUNCTION 03/25/2007  . GERD 03/25/2007  . HEPATITIS C, HX OF 03/25/2007  . HYPERTENSION 03/25/2007  . Morbid obesity ([place]) 03/25/2007  . NEPHROLITHIASIS, HX OF 03/18/2009  . Complication of anesthesia     wife states pt had trouble waking up with his last surgery in Nov., 2014  . History of Clostridium difficile   . Hemorrhoids   . Anemia   . Antral ulcer 2014    small    Past Surgical History  Procedure Laterality Date  . Nephrectomy      partial RR  . Av fistula placement  06/14/2012    Procedure: ARTERIOVENOUS (AV) FISTULA CREATION;  Surgeon: Angelia Mould, MD;  Location: [facility] OR;  Service: Vascular;  Laterality: Left;  Left basilic vein transposition with fistula.  . Tibia  im nail insertion Left 09/09/2012    Procedure: INTRAMEDULLARY (IM) NAIL TIBIAL;  Surgeon: Johnny Bridge, MD;  Location: [place];  Service: Orthopedics;  Laterality: Left;  left tibial nail and open reduction internal fixation left fibula fracture  . Orif fibula fracture Left 09/09/2012    Procedure: OPEN REDUCTION INTERNAL FIXATION (ORIF) FIBULA FRACTURE;  Surgeon: Johnny Bridge, MD;  Location: [place];  Service: Orthopedics;  Laterality: Left;  . Colonoscopy N/A 10/28/2012    Procedure: COLONOSCOPY;  Surgeon: Jeryl Columbia, MD;  Location: [facility] ENDOSCOPY;  Service: Endoscopy;  Laterality: N/A;  . Esophagogastroduodenoscopy N/A 11/02/2012    Procedure: ESOPHAGOGASTRODUODENOSCOPY (EGD);  Surgeon: Cleotis Nipper, MD;  Location: [facility] ENDOSCOPY;  Service: Endoscopy;  Laterality: N/A;  . Colonoscopy N/A 11/02/2012    Procedure: COLONOSCOPY;  Surgeon: Cleotis Nipper, MD;  Location: [facility] ENDOSCOPY;  Service: Endoscopy;  Laterality: N/A;  . Colonoscopy N/A 11/03/2012    Procedure: COLONOSCOPY;  Surgeon: Cleotis Nipper, MD;  Location: [facility] ENDOSCOPY;  Service: Endoscopy;  Laterality: N/A;  . Givens capsule study N/A 11/04/2012    Procedure: GIVENS CAPSULE STUDY;  Surgeon: Cleotis Nipper, MD;  Location: [facility] ENDOSCOPY;  Service: Endoscopy;  Laterality: N/A;  . Enteroscopy N/A 11/08/2012    Procedure: ENTEROSCOPY;  Surgeon: Wonda Horner, MD;  Location: [facility] ENDOSCOPY;  Service: Endoscopy;  Laterality: N/A;  . Amputation Left 05/12/2013  Procedure: AMPUTATION RAY;  Surgeon: Newt Minion, MD;  Location: [place];  Service: Orthopedics;  Laterality: Left;  Left Foot 1st Ray Amputation  . Eye surgery Left     to remove scar tissue  . Amputation Left 06/09/2013    Procedure: AMPUTATION BELOW KNEE;  Surgeon: Newt Minion, MD;  Location: [place];  Service: Orthopedics;  Laterality: Left;  Left Below Knee Amputation and removal proximal screws IM tibial nail  . Hardware removal Left 06/09/2013    Procedure: HARDWARE REMOVAL;   Surgeon: Newt Minion, MD;  Location: [place];  Service: Orthopedics;  Laterality: Left;  Left Below Knee Amputation  and Removal proximal screws IM tibial nail  . Amputation Right 09/08/2013    Procedure: AMPUTATION BELOW KNEE;  Surgeon: Newt Minion, MD;  Location: [place];  Service: Orthopedics;  Laterality: Right;  Right Below Knee Amputation  . Amputation Right 10/11/2013    Procedure: AMPUTATION BELOW KNEE;  Surgeon: Newt Minion, MD;  Location: [place];  Service: Orthopedics;  Laterality: Right;  Right Below Knee Amputation Revision    There were no vitals filed for this visit.  Visit Diagnosis:  Generalized weakness  Decreased functional activity tolerance  Abnormality of gait  Balance problems  Status post bilateral below knee amputation (HCC)  Decreased range of knee movement, unspecified laterality  Impaired transfers      Subjective Assessment - 06/26/15 0937    Subjective This 63yo male underwent left Transtibial Amputation Nov. 2014 and right Transtibial Amputation Feb. 2015. He recieved prostheses Fall 2015 and recieved PT until Feb 2016. He has continued to exercise as advised to build strenth, endurance & flexiblity. He returns to PT to progress his mobility    Patient is accompained by: Family member   Patient Stated Goals To be able to walk more & hopefully by himself with prostheses   Currently in Pain? No/denies            [facility] PT Assessment - 06/26/15 0930    Assessment   Medical Diagnosis Bilateral Transtibial Amputations   Referring Provider [name], MD   Precautions   Precautions Fall  No BP in LUE   Precaution Comments NO BP LUE   Restrictions   Weight Bearing Restrictions No   Balance Screen   Has the patient fallen in the past 6 months No   Has the patient had a decrease in activity level because of a fear of falling?  Yes   Is the patient reluctant to leave their home because of a fear of falling?  Yes   [place] Private residence   Living Arrangements Spouse/significant other   Type of [place] Access Level entry   Price One level  ramp over single step from den to [place] - 2 wheels;Cane - single point;Crutches;Bedside commode;Shower seat;Tub bench;Wheelchair - manual;Hospital bed   Prior Function   Level of Independence Independent;Independent with household mobility without device;Independent with community mobility without device;Needs assistance with gait  prior to amputations no device, now needs assist   Observation/Other Assessments   Focus on Therapeutic Outcomes (FOTO)  Functional Status 40.3%   Fear Avoidance Belief Questionnaire (FABQ)  66 (18)   Posture/Postural Control   Posture/Postural Control Postural limitations   Postural Limitations Rounded Shoulders;Forward head;Flexed trunk   ROM / Strength   AROM / PROM / Strength PROM;Strength   PROM   Overall  PROM  Deficits   PROM Assessment Site Hip;Knee   Right/Left Hip Right;Left   Left Hip Extension -12*  Thomas Position   Right/Left Knee Right;Left   Right Knee Extension -41   Left Knee Extension -44   Strength   Overall Strength Deficits   Overall Strength Comments UEs WFL   Strength Assessment Site Hip;Knee   Right/Left Hip Right;Left   Right Hip Flexion 4/5   Right Hip Extension 4-/5   Right Hip ABduction 4-/5   Left Hip Flexion 4/5   Left Hip Extension 4-/5   Left Hip ABduction 4-/5   Right/Left Knee Right;Left   Right Knee Flexion 4/5   Right Knee Extension 4/5   Left Knee Flexion 4/5   Left Knee Extension 4/5   Right Hip   Right Hip Extension -11  Thomas Position   Transfers   Transfers Sit to Stand;Stand to [employer];Squat Pivot Transfers;Supine to Sit   Sit to Stand 2: Max assist;With upper extremity assist;With armrests;From chair/3-in-1  2 people assist; to RW   Stand to Sit 3: Mod assist;With upper extremity assist;With armrests;To chair/3-in-1  from [employer] Transfers 4: Min assist;4: Min guard;With upper extremity assistance  prostheses, to 2" lower min Guard, to 2" higher MinA   Supine to Sit 6: Modified independent (Device/Increase time)   Ambulation/Gait   Ambulation/Gait Yes   Ambulation/Gait Assistance 3: Mod assist  2 people for safety   Ambulation Distance (Feet) 25 Feet   Assistive device Prostheses;Rolling walker   Gait Pattern Decreased step length - right;Decreased step length - left;Decreased stride length;Right flexed knee in stance;Left flexed knee in stance;Trunk flexed;Narrow base of support   Ambulation Surface Indoor;Level   Balance   Balance Assessed Yes   Static Standing Balance   Static Standing - Balance Support Bilateral upper extremity supported  RW support   Static Standing - Level of Assistance 4: Min assist   Static Standing - Comment/# of Minutes 1 minute   Dynamic Standing Balance   Dynamic Standing - Balance Support Bilateral upper extremity supported  with RW   Dynamic Standing - Level of Assistance 4: Min assist   Dynamic Standing - Balance Activities Head turns;Head nods   Dynamic Standing - Comments unable to release RW to reach with either UE         Prosthetics Assessment - 06/26/15 0930    Prosthetics   Prosthetic Care Independent with Residual limb care;Prosthetic cleaning   Prosthetic Care Dependent with Proper wear schedule/adjustment   Donning prosthesis  Min assist  Patient arrived with prostheses donned, one liner backwards   Doffing prosthesis  Supervision   Current prosthetic wear tolerance (days/week)  7 days   Current prosthetic wear tolerance (#hours/day)  90% of awake hours, including dialysis   Current prosthetic weight-bearing tolerance (hours/day)  2 minutes with no c/o pain   Residual limb condition  no open areas, normal moisture & coloring                            PT Short Term Goals - 06/26/15 1015    PT SHORT TERM GOAL #1   Title  Patient sit to /from stand w/c to sink with moderate assist. (Target Date: 07/26/2015)   Time 1   Period Months   Status New   PT SHORT TERM GOAL #2   Title Patient & wife demonstrate / verbalize HEP / updated exercise program for fitness  center. (Target Date: 07/26/2015)   Time 1   Period Months   Status New   PT SHORT TERM GOAL #3   Title Patient ambulates 65' with RW & prostheses with moderate assist (2 people for safety) (Target Date: 07/26/2015)   Time 1   Period Months   Status New           PT Long Term Goals - 2015/07/09 1015    PT LONG TERM GOAL #1   Title Patient and wife demonstrate proper prosthetic care. (Target Date: 09/20/2015)   Time 3   Period Months   Status New   PT LONG TERM GOAL #2   Title Pt & wife verbalize & demonstrate ongoing HEP / fitness plan.  (Target Date: 09/20/2015)   Time 3   Period Months   Status New   PT LONG TERM GOAL #3   Title stand pivot transfers with rolling walker & prostheses with supervision.  (Target Date: 09/20/2015)   Time 3   Period Months   Status New   PT LONG TERM GOAL #4   Title adjusts clothes & reaches 2" anteriorly while standing with RW support with minimal assist.  (Target Date: 09/20/2015)   Time 3   Period Months   Status New   PT LONG TERM GOAL #5   Title ambulate 66' with rolling walker & prostheses with minimal assist.  (Target Date: 09/20/2015)   Time 3   Period Months   Status New               Plan - 07/09/2015 1015    Clinical Impression Statement Patient has improved strength & endurance with work at [employer] fitness center 2-3 times/wk for >5 months. He has potential to ambulate with bilateral transtibial prostheses & rolling walker with further PT training. He has hip & knee strength of 4/5 which is improved from previous PT episode of care. He has knee flexion contractures. He is dependent at moderate to maximal assist in standing balance & gait with bilateral prostheses. His elderly wife is not able  to assist at his current level of assistance.     Pt will benefit from skilled therapeutic intervention in order to improve on the following deficits Abnormal gait;Decreased activity tolerance;Decreased balance;Decreased endurance;Decreased mobility;Decreased range of motion;Decreased strength;Postural dysfunction;Prosthetic Dependency   Rehab Potential Good   PT Frequency 2x / week   PT Duration Other (comment)  13 weeks (90 days)   PT Treatment/Interventions ADLs/Self Care Home Management;DME Instruction;Gait training;Stair training;Functional mobility training;Therapeutic activities;Therapeutic exercise;Balance training;Neuromuscular re-education;Patient/family education;Prosthetic Training   PT Next Visit Plan instruct in use of NuStep & Bilateral leg press to continue these at [facility]; work on sit to stand at sink and standing balance for home. Set-up prosthetist to attend PT session to check alignment.    Consulted and Agree with Plan of Care Patient;Family member/caregiver   Family Member Consulted wife          G-Codes - July 09, 2015 1015    Functional Assessment Tool Used MaxA (2 people for safety) sit to stand from w/c to RW and modA stand to sit. Maintains upright holding RW for 1 minutes with BUE support.    Functional Limitation Changing and maintaining body position   Changing and Maintaining Body Position Current Status [phone]) At least 80 percent but less than 100 percent impaired, limited or restricted   Changing and Maintaining Body Position Goal Status [id number]) At least 40 percent but less than 60 percent impaired, limited  or restricted       Problem List Patient Active Problem List   Diagnosis Date Noted  . Diarrhea 08/14/2014  . Dehydration 10/02/2013  . Fever 10/02/2013  . Altered mental status 10/01/2013  . Encephalopathy, toxic 10/01/2013  . Encephalopathy, metabolic [id number]  . CVA (cerebral vascular accident) ([place]) 09/28/2013  . FTT (failure to  thrive) in adult 09/28/2013  . Acute confusional state 09/28/2013  . Ulcer of sacral region, stage 3 ([place]) 09/26/2013  . S/P BKA (below knee amputation) bilateral ([place]) 09/08/2013  . ESRD on hemodialysis ([place]) 05/09/2013  . TIA (transient ischemic attack) 02/20/2013  . Acute blood loss anemia 10/28/2012  . Chronic combined systolic (EF [id number]) and grade 2diastolic congestive heart failure 10/28/2012  . Obstipation 10/28/2012  . GI bleed 10/27/2012  . Mass in rectum 10/27/2012  . DM (diabetes mellitus) type I controlled with renal manifestation ([place]) 09/08/2012  . LVH (left ventricular hypertrophy)-severe concentric 06/13/2012  . Anemia due to chronic illness 06/12/2012  . Hyperlipidemia 01/30/2011  . CHOLELITHIASIS 08/01/2010  . BENIGN PROSTATIC HYPERTROPHY 08/01/2010  . CEREBROVASCULAR ACCIDENT, HX OF 08/06/2009  . Depression 03/18/2009  . Acute combined systolic and diastolic heart failure, NYHA class 2-EF 45% 03/18/2009  . NEPHROLITHIASIS, HX OF 03/18/2009  . Morbid obesity ([place]) 03/25/2007  . Essential hypertension 03/25/2007  . GERD 03/25/2007  . Hepatitis C 03/25/2007    Zyon Rosser PT, DPT 06/26/2015, 7:58 PM  [facility] [address] [place], [place], [address] Phone: [phone]   Fax:  [phone]  Name: Oscar Castillo MRN: [id number] Date of Birth: 1950-08-18

## 2015-06-28 ENCOUNTER — Encounter: Payer: Self-pay | Admitting: Physical Therapy

## 2015-06-28 ENCOUNTER — Ambulatory Visit: Payer: Medicare Other | Attending: Internal Medicine | Admitting: Physical Therapy

## 2015-06-28 DIAGNOSIS — R531 Weakness: Secondary | ICD-10-CM | POA: Insufficient documentation

## 2015-06-28 DIAGNOSIS — Z7409 Other reduced mobility: Secondary | ICD-10-CM

## 2015-06-28 DIAGNOSIS — Z89511 Acquired absence of right leg below knee: Secondary | ICD-10-CM

## 2015-06-28 DIAGNOSIS — R6889 Other general symptoms and signs: Secondary | ICD-10-CM | POA: Diagnosis not present

## 2015-06-28 DIAGNOSIS — Z89512 Acquired absence of left leg below knee: Secondary | ICD-10-CM | POA: Diagnosis not present

## 2015-06-28 DIAGNOSIS — R2689 Other abnormalities of gait and mobility: Secondary | ICD-10-CM

## 2015-06-28 DIAGNOSIS — R2991 Unspecified symptoms and signs involving the musculoskeletal system: Secondary | ICD-10-CM

## 2015-06-28 DIAGNOSIS — R269 Unspecified abnormalities of gait and mobility: Secondary | ICD-10-CM | POA: Insufficient documentation

## 2015-06-28 DIAGNOSIS — R29818 Other symptoms and signs involving the nervous system: Secondary | ICD-10-CM | POA: Diagnosis not present

## 2015-06-28 DIAGNOSIS — R5381 Other malaise: Secondary | ICD-10-CM | POA: Insufficient documentation

## 2015-06-28 DIAGNOSIS — M24669 Ankylosis, unspecified knee: Secondary | ICD-10-CM | POA: Diagnosis not present

## 2015-06-28 NOTE — Therapy (Signed)
[facility] [address] [place], [place], [address] Phone: [phone]   Fax:  [phone]  Physical Therapy Treatment  Patient Details  Name: Oscar Castillo MRN: [id number] Date of Birth: Jul 14, 1951 Referring Provider: [name], MD  Encounter Date: 06/28/2015      PT End of Session - 06/28/15 0826    Visit Number 2   Number of Visits 26   Date for PT Re-Evaluation 09/20/15   Authorization Type Medicare Do G-Code & progress reports every 10 visits   PT Start Time 0805   PT Stop Time 0845   PT Time Calculation (min) 40 min   Equipment Utilized During Treatment Gait belt   Activity Tolerance Patient tolerated treatment well   Behavior During Therapy [facility] for tasks assessed/performed      Past Medical History  Diagnosis Date  . ESRD on hemodialysis ([place]) 05/05/2007    ESRD due to DM/HTN. Started dialysis in November 2013.  HD TTS at [facility] on [address].  [name] BACK PAIN, LUMBAR, CHRONIC 08/06/2009  . BENIGN PROSTATIC HYPERTROPHY 08/01/2010  . CEREBROVASCULAR ACCIDENT, HX OF 08/06/2009  . CHOLELITHIASIS 08/01/2010  . CONGESTIVE HEART FAILURE 03/18/2009  . DEPRESSION 03/18/2009  . DIABETES MELLITUS, TYPE II 03/25/2007  . ERECTILE DYSFUNCTION 03/25/2007  . GERD 03/25/2007  . HEPATITIS C, HX OF 03/25/2007  . HYPERTENSION 03/25/2007  . Morbid obesity ([place]) 03/25/2007  . NEPHROLITHIASIS, HX OF 03/18/2009  . Complication of anesthesia     wife states pt had trouble waking up with his last surgery in Nov., 2014  . History of Clostridium difficile   . Hemorrhoids   . Anemia   . Antral ulcer 2014    small    Past Surgical History  Procedure Laterality Date  . Nephrectomy      partial RR  . Av fistula placement  06/14/2012    Procedure: ARTERIOVENOUS (AV) FISTULA CREATION;  Surgeon: Angelia Mould, MD;  Location: [facility] OR;  Service: Vascular;  Laterality: Left;  Left basilic vein transposition with fistula.  . Tibia  im nail insertion Left 09/09/2012    Procedure: INTRAMEDULLARY (IM) NAIL TIBIAL;  Surgeon: Johnny Bridge, MD;  Location: [facility];  Service: Orthopedics;  Laterality: Left;  left tibial nail and open reduction internal fixation left fibula fracture  . Orif fibula fracture Left 09/09/2012    Procedure: OPEN REDUCTION INTERNAL FIXATION (ORIF) FIBULA FRACTURE;  Surgeon: Johnny Bridge, MD;  Location: [facility];  Service: Orthopedics;  Laterality: Left;  . Colonoscopy N/A 10/28/2012    Procedure: COLONOSCOPY;  Surgeon: Jeryl Columbia, MD;  Location: [facility] ENDOSCOPY;  Service: Endoscopy;  Laterality: N/A;  . Esophagogastroduodenoscopy N/A 11/02/2012    Procedure: ESOPHAGOGASTRODUODENOSCOPY (EGD);  Surgeon: Cleotis Nipper, MD;  Location: [facility] ENDOSCOPY;  Service: Endoscopy;  Laterality: N/A;  . Colonoscopy N/A 11/02/2012    Procedure: COLONOSCOPY;  Surgeon: Cleotis Nipper, MD;  Location: [facility] ENDOSCOPY;  Service: Endoscopy;  Laterality: N/A;  . Colonoscopy N/A 11/03/2012    Procedure: COLONOSCOPY;  Surgeon: Cleotis Nipper, MD;  Location: [facility] ENDOSCOPY;  Service: Endoscopy;  Laterality: N/A;  . Givens capsule study N/A 11/04/2012    Procedure: GIVENS CAPSULE STUDY;  Surgeon: Cleotis Nipper, MD;  Location: [facility] ENDOSCOPY;  Service: Endoscopy;  Laterality: N/A;  . Enteroscopy N/A 11/08/2012    Procedure: ENTEROSCOPY;  Surgeon: Wonda Horner, MD;  Location: [facility] ENDOSCOPY;  Service: Endoscopy;  Laterality: N/A;  . Amputation Left 05/12/2013  Procedure: AMPUTATION RAY;  Surgeon: Newt Minion, MD;  Location: [place];  Service: Orthopedics;  Laterality: Left;  Left Foot 1st Ray Amputation  . Eye surgery Left     to remove scar tissue  . Amputation Left 06/09/2013    Procedure: AMPUTATION BELOW KNEE;  Surgeon: Newt Minion, MD;  Location: [facility] [place];  Service: Orthopedics;  Laterality: Left;  Left Below Knee Amputation and removal proximal screws IM tibial nail  . Hardware removal Left 06/09/2013    Procedure: HARDWARE REMOVAL;   Surgeon: Newt Minion, MD;  Location: [place];  Service: Orthopedics;  Laterality: Left;  Left Below Knee Amputation  and Removal proximal screws IM tibial nail  . Amputation Right 09/08/2013    Procedure: AMPUTATION BELOW KNEE;  Surgeon: Newt Minion, MD;  Location: [place];  Service: Orthopedics;  Laterality: Right;  Right Below Knee Amputation  . Amputation Right 10/11/2013    Procedure: AMPUTATION BELOW KNEE;  Surgeon: Newt Minion, MD;  Location: [place];  Service: Orthopedics;  Laterality: Right;  Right Below Knee Amputation Revision    There were no vitals filed for this visit.  Visit Diagnosis:  Generalized weakness  Decreased functional activity tolerance  Abnormality of gait  Balance problems  Status post bilateral below knee amputation (HCC)  Decreased range of knee movement, unspecified laterality  Impaired transfers  Impaired functional mobility and activity tolerance  Debility      Subjective Assessment - 06/28/15 0828    Subjective Pt reports not reaching dry weight yesterday at dialysis. Pt reports having trouble donning L prothesis before treatment this morning. Pt required min assist to donn upon arrival. Pt reports skin WNL. Pt reports going to [facility] center at least 2X per week.    Patient is accompained by: Family member   Patient Stated Goals To be able to walk more & hopefully by himself with prostheses     Prosthetic Training: To increase pt's knowledge and use of bil prostheses - liner readjusted/redonned and then prosthesis was able to be donned with min assist.   Exercises: to promote continued community fitness and independence with set up/use of specific equipment (with spouse assist vs trainer) 1. Nustep: Pt used lateral transfer from [facility] to machine. Pt min guard to min assist. Pt required cues for set-up and sequencing of extremities on nustep. Pt's wife verbalized and demonstrated understanding. Pt completed 8 min on machine and required min assist with  lateral transfer back to WC due to weakness.   2. Seated leg press: Pt required min assist for lateral transfer from [facility] to machine with verbal and visual cues for feet and hand placement. Wife was instructed to assist in positioning B feet on foot plate. Pt completed 2 sets X15 reps at 30#, and X10 reps at 40#. Pt's wife independent with min assist for lateral transfer from machine to [facility].        [facility] Adult PT Treatment/Exercise - 06/28/15 0001    Prosthetics   Current prosthetic wear tolerance (days/week)  7 days   Current prosthetic wear tolerance (#hours/day)  90% of awake hours, including dialysis   Current prosthetic weight-bearing tolerance (hours/day)  2 minutes with no c/o pain   Residual limb condition  no open areas, normal moisture & coloring          PT Education - 06/28/15 0822    Education provided Yes   Education Details Pt and wife educated on WC to NU-step transfer and set up  of machine along with resistance controls. Wife verbalized understanding of the set up. Both were also educated on use of the the seated leg press and pt's wife demonstrated and verbalized competency with assisting the pt in a safe manner.   Person(s) Educated Patient;Spouse   Methods Explanation   Comprehension Verbalized understanding;Returned demonstration          PT Short Term Goals - 06/28/15 1251    PT SHORT TERM GOAL #1   Title Patient sit to /from stand w/c to sink with moderate assist. (Target Date: 07/26/2015)   Time 1   Period Months   Status New   PT SHORT TERM GOAL #2   Title Patient & wife demonstrate / verbalize HEP / updated exercise program for fitness center. (Target Date: 07/26/2015)   Time 1   Period Months   Status New   PT SHORT TERM GOAL #3   Title Patient ambulates 77' with RW & prostheses with moderate assist (2 people for safety) (Target Date: 07/26/2015)   Time 1   Period Months   Status New           PT Long Term Goals - 06/28/15 1251    PT LONG TERM  GOAL #1   Title Patient and wife demonstrate proper prosthetic care. (Target Date: 09/20/2015)   Time 3   Period Months   Status New   PT LONG TERM GOAL #2   Title Pt & wife verbalize & demonstrate ongoing HEP / fitness plan.  (Target Date: 09/20/2015)   Time 3   Period Months   Status New   PT LONG TERM GOAL #3   Title stand pivot transfers with rolling walker & prostheses with supervision.  (Target Date: 09/20/2015)   Time 3   Period Months   Status New   PT LONG TERM GOAL #4   Title adjusts clothes & reaches 2" anteriorly while standing with RW support with minimal assist.  (Target Date: 09/20/2015)   Time 3   Period Months   Status New   PT LONG TERM GOAL #5   Title ambulate 38' with rolling walker & prostheses with minimal assist.  (Target Date: 09/20/2015)   Time 3   Period Months   Status New               Plan - 06/28/15 0827    Clinical Impression Statement Skilled treatment session focused on pt's independence at gym with assistance from his wife. Pt min guard assist with lateral transfer from WC to NU-step machine. Pt min assist with seated leg press in order to place B prostheses on foot plate.  Pt is I with skin care of residual limb and min assist with donning prostheses when there is fluid retention. Pt making steady progress toward goals.   Pt will benefit from skilled therapeutic intervention in order to improve on the following deficits Abnormal gait;Decreased activity tolerance;Decreased balance;Decreased endurance;Decreased mobility;Decreased range of motion;Decreased strength;Postural dysfunction;Prosthetic Dependency   Rehab Potential Good   PT Frequency 2x / week   PT Duration Other (comment)  13 weeks (90 days)   PT Treatment/Interventions ADLs/Self Care Home Management;DME Instruction;Gait training;Stair training;Functional mobility training;Therapeutic activities;Therapeutic exercise;Balance training;Neuromuscular re-education;Patient/family  education;Prosthetic Training   PT Next Visit Plan Instruct on sink HEP and work on sit to stand and stand pivot transfers from [facility] to Chair with armrests. Continue to reinforce exercise at [facility] center.    Consulted and Agree with Plan of Care Patient;Family member/caregiver   Family  Member Consulted wife        Problem List Patient Active Problem List   Diagnosis Date Noted  . Diarrhea 08/14/2014  . Dehydration 10/02/2013  . Fever 10/02/2013  . Altered mental status 10/01/2013  . Encephalopathy, toxic 10/01/2013  . Encephalopathy, metabolic 99991111  . CVA (cerebral vascular accident) ([place]) 09/28/2013  . FTT (failure to thrive) in adult 09/28/2013  . Acute confusional state 09/28/2013  . Ulcer of sacral region, stage 3 ([place]) 09/26/2013  . S/P BKA (below knee amputation) bilateral ([place]) 09/08/2013  . ESRD on hemodialysis ([place]) 05/09/2013  . TIA (transient ischemic attack) 02/20/2013  . Acute blood loss anemia 10/28/2012  . Chronic combined systolic (EF [id number]) and grade 2diastolic congestive heart failure 10/28/2012  . Obstipation 10/28/2012  . GI bleed 10/27/2012  . Mass in rectum 10/27/2012  . DM (diabetes mellitus) type I controlled with renal manifestation ([place]) 09/08/2012  . LVH (left ventricular hypertrophy)-severe concentric 06/13/2012  . Anemia due to chronic illness 06/12/2012  . Hyperlipidemia 01/30/2011  . CHOLELITHIASIS 08/01/2010  . BENIGN PROSTATIC HYPERTROPHY 08/01/2010  . CEREBROVASCULAR ACCIDENT, HX OF 08/06/2009  . Depression 03/18/2009  . Acute combined systolic and diastolic heart failure, NYHA class 2-EF 45% 03/18/2009  . NEPHROLITHIASIS, HX OF 03/18/2009  . Morbid obesity ([place]) 03/25/2007  . Essential hypertension 03/25/2007  . GERD 03/25/2007  . Hepatitis C 03/25/2007    Laney Potash 06/28/2015, 1:06 PM  Laney Potash, [place]  Name: Oscar Castillo MRN: [id number] Date of Birth: May 30, 1951  This note has been reviewed and edited by  supervising CI.  Willow Ora, PTA, [facility] [address], [place], [place] [address] [phone] 06/30/2015, 10:44 PM

## 2015-06-29 DIAGNOSIS — D631 Anemia in chronic kidney disease: Secondary | ICD-10-CM | POA: Diagnosis not present

## 2015-06-29 DIAGNOSIS — N186 End stage renal disease: Secondary | ICD-10-CM | POA: Diagnosis not present

## 2015-06-29 DIAGNOSIS — N2581 Secondary hyperparathyroidism of renal origin: Secondary | ICD-10-CM | POA: Diagnosis not present

## 2015-06-29 DIAGNOSIS — E1122 Type 2 diabetes mellitus with diabetic chronic kidney disease: Secondary | ICD-10-CM | POA: Diagnosis not present

## 2015-07-02 DIAGNOSIS — E1122 Type 2 diabetes mellitus with diabetic chronic kidney disease: Secondary | ICD-10-CM | POA: Diagnosis not present

## 2015-07-02 DIAGNOSIS — N186 End stage renal disease: Secondary | ICD-10-CM | POA: Diagnosis not present

## 2015-07-02 DIAGNOSIS — D631 Anemia in chronic kidney disease: Secondary | ICD-10-CM | POA: Diagnosis not present

## 2015-07-02 DIAGNOSIS — N2581 Secondary hyperparathyroidism of renal origin: Secondary | ICD-10-CM | POA: Diagnosis not present

## 2015-07-02 NOTE — Telephone Encounter (Signed)
Pt called in and is wanting a nurse to call him back about the Hep C situation.    Can you please call him when you get a chance  Best number [phone]

## 2015-07-02 NOTE — Telephone Encounter (Signed)
Spoke to pt about the positive Hep C and gave information about referral to infectious disease.

## 2015-07-03 ENCOUNTER — Ambulatory Visit: Payer: Medicare Other | Admitting: Physical Therapy

## 2015-07-03 ENCOUNTER — Encounter: Payer: Self-pay | Admitting: Physical Therapy

## 2015-07-03 DIAGNOSIS — R531 Weakness: Secondary | ICD-10-CM | POA: Diagnosis not present

## 2015-07-03 DIAGNOSIS — Z89511 Acquired absence of right leg below knee: Secondary | ICD-10-CM | POA: Diagnosis not present

## 2015-07-03 DIAGNOSIS — Z89512 Acquired absence of left leg below knee: Secondary | ICD-10-CM | POA: Diagnosis not present

## 2015-07-03 DIAGNOSIS — R6889 Other general symptoms and signs: Secondary | ICD-10-CM

## 2015-07-03 DIAGNOSIS — R269 Unspecified abnormalities of gait and mobility: Secondary | ICD-10-CM | POA: Diagnosis not present

## 2015-07-03 DIAGNOSIS — R29818 Other symptoms and signs involving the nervous system: Secondary | ICD-10-CM | POA: Diagnosis not present

## 2015-07-03 DIAGNOSIS — R2991 Unspecified symptoms and signs involving the musculoskeletal system: Secondary | ICD-10-CM

## 2015-07-03 DIAGNOSIS — Z7409 Other reduced mobility: Secondary | ICD-10-CM

## 2015-07-03 DIAGNOSIS — M24669 Ankylosis, unspecified knee: Secondary | ICD-10-CM

## 2015-07-03 DIAGNOSIS — R2689 Other abnormalities of gait and mobility: Secondary | ICD-10-CM

## 2015-07-03 NOTE — Therapy (Signed)
[facility] [address] [place], [place], [address] Phone: [phone]   Fax:  [phone]  Physical Therapy Treatment  Patient Details  Name: Oscar Castillo MRN: [id number] Date of Birth: 22-Oct-1950 Referring Provider: [name], MD  Encounter Date: 07/03/2015      PT End of Session - 07/03/15 1230    Visit Number 3   Number of Visits 26   Date for PT Re-Evaluation 09/20/15   Authorization Type Medicare Do G-Code & progress reports every 10 visits   PT Start Time 1145   PT Stop Time 1230   PT Time Calculation (min) 45 min   Equipment Utilized During Treatment Gait belt   Activity Tolerance Patient tolerated treatment well   Behavior During Therapy [facility] for tasks assessed/performed      Past Medical History  Diagnosis Date  . ESRD on hemodialysis ([place]) 05/05/2007    ESRD due to DM/HTN. Started dialysis in November 2013.  HD TTS at [facility] on [place].  [name] BACK PAIN, LUMBAR, CHRONIC 08/06/2009  . BENIGN PROSTATIC HYPERTROPHY 08/01/2010  . CEREBROVASCULAR ACCIDENT, HX OF 08/06/2009  . CHOLELITHIASIS 08/01/2010  . CONGESTIVE HEART FAILURE 03/18/2009  . DEPRESSION 03/18/2009  . DIABETES MELLITUS, TYPE II 03/25/2007  . ERECTILE DYSFUNCTION 03/25/2007  . GERD 03/25/2007  . HEPATITIS C, HX OF 03/25/2007  . HYPERTENSION 03/25/2007  . Morbid obesity ([place]) 03/25/2007  . NEPHROLITHIASIS, HX OF 03/18/2009  . Complication of anesthesia     wife states pt had trouble waking up with his last surgery in Nov., 2014  . History of Clostridium difficile   . Hemorrhoids   . Anemia   . Antral ulcer 2014    small    Past Surgical History  Procedure Laterality Date  . Nephrectomy      partial RR  . Av fistula placement  06/14/2012    Procedure: ARTERIOVENOUS (AV) FISTULA CREATION;  Surgeon: Angelia Mould, MD;  Location: [facility] OR;  Service: Vascular;  Laterality: Left;  Left basilic vein transposition with fistula.  . Tibia  im nail insertion Left 09/09/2012    Procedure: INTRAMEDULLARY (IM) NAIL TIBIAL;  Surgeon: Johnny Bridge, MD;  Location: [place];  Service: Orthopedics;  Laterality: Left;  left tibial nail and open reduction internal fixation left fibula fracture  . Orif fibula fracture Left 09/09/2012    Procedure: OPEN REDUCTION INTERNAL FIXATION (ORIF) FIBULA FRACTURE;  Surgeon: Johnny Bridge, MD;  Location: [place];  Service: Orthopedics;  Laterality: Left;  . Colonoscopy N/A 10/28/2012    Procedure: COLONOSCOPY;  Surgeon: Jeryl Columbia, MD;  Location: [facility] ENDOSCOPY;  Service: Endoscopy;  Laterality: N/A;  . Esophagogastroduodenoscopy N/A 11/02/2012    Procedure: ESOPHAGOGASTRODUODENOSCOPY (EGD);  Surgeon: Cleotis Nipper, MD;  Location: [facility] ENDOSCOPY;  Service: Endoscopy;  Laterality: N/A;  . Colonoscopy N/A 11/02/2012    Procedure: COLONOSCOPY;  Surgeon: Cleotis Nipper, MD;  Location: [facility] ENDOSCOPY;  Service: Endoscopy;  Laterality: N/A;  . Colonoscopy N/A 11/03/2012    Procedure: COLONOSCOPY;  Surgeon: Cleotis Nipper, MD;  Location: [facility] ENDOSCOPY;  Service: Endoscopy;  Laterality: N/A;  . Givens capsule study N/A 11/04/2012    Procedure: GIVENS CAPSULE STUDY;  Surgeon: Cleotis Nipper, MD;  Location: [facility] ENDOSCOPY;  Service: Endoscopy;  Laterality: N/A;  . Enteroscopy N/A 11/08/2012    Procedure: ENTEROSCOPY;  Surgeon: Wonda Horner, MD;  Location: [facility] ENDOSCOPY;  Service: Endoscopy;  Laterality: N/A;  . Amputation Left 05/12/2013  Procedure: AMPUTATION RAY;  Surgeon: Newt Minion, MD;  Location: [place];  Service: Orthopedics;  Laterality: Left;  Left Foot 1st Ray Amputation  . Eye surgery Left     to remove scar tissue  . Amputation Left 06/09/2013    Procedure: AMPUTATION BELOW KNEE;  Surgeon: Newt Minion, MD;  Location: [place];  Service: Orthopedics;  Laterality: Left;  Left Below Knee Amputation and removal proximal screws IM tibial nail  . Hardware removal Left 06/09/2013    Procedure: HARDWARE REMOVAL;   Surgeon: Newt Minion, MD;  Location: [place];  Service: Orthopedics;  Laterality: Left;  Left Below Knee Amputation  and Removal proximal screws IM tibial nail  . Amputation Right 09/08/2013    Procedure: AMPUTATION BELOW KNEE;  Surgeon: Newt Minion, MD;  Location: [place];  Service: Orthopedics;  Laterality: Right;  Right Below Knee Amputation  . Amputation Right 10/11/2013    Procedure: AMPUTATION BELOW KNEE;  Surgeon: Newt Minion, MD;  Location: [place];  Service: Orthopedics;  Laterality: Right;  Right Below Knee Amputation Revision    There were no vitals filed for this visit.  Visit Diagnosis:  Generalized weakness  Decreased functional activity tolerance  Abnormality of gait  Balance problems  Status post bilateral below knee amputation (HCC)  Decreased range of knee movement, unspecified laterality  Impaired transfers      Subjective Assessment - 07/03/15 1150    Subjective (p) He has gone to [facility] Monday to exercise class but did not try the machines.    Currently in Pain? (p) No/denies                                 PT Education - 07/03/15 1230    Education provided Yes   Education Details NuStep set-up and use, Leg press set-up, weight choice and technique,    Person(s) Educated Patient;Spouse   Methods Explanation;Demonstration;Verbal cues   Comprehension Verbalized understanding;Returned demonstration;Verbal cues required;Tactile cues required;Need further instruction          PT Short Term Goals - 06/28/15 1251    PT SHORT TERM GOAL #1   Title Patient sit to /from stand w/c to sink with moderate assist. (Target Date: 07/26/2015)   Time 1   Period Months   Status New   PT SHORT TERM GOAL #2   Title Patient & wife demonstrate / verbalize HEP / updated exercise program for fitness center. (Target Date: 07/26/2015)   Time 1   Period Months   Status New   PT SHORT TERM GOAL #3   Title Patient ambulates 19' with RW &  prostheses with moderate assist (2 people for safety) (Target Date: 07/26/2015)   Time 1   Period Months   Status New           PT Long Term Goals - 06/28/15 1251    PT LONG TERM GOAL #1   Title Patient and wife demonstrate proper prosthetic care. (Target Date: 09/20/2015)   Time 3   Period Months   Status New   PT LONG TERM GOAL #2   Title Pt & wife verbalize & demonstrate ongoing HEP / fitness plan.  (Target Date: 09/20/2015)   Time 3   Period Months   Status New   PT LONG TERM GOAL #3   Title stand pivot transfers with rolling walker & prostheses with supervision.  (Target Date: 09/20/2015)  Time 3   Period Months   Status New   PT LONG TERM GOAL #4   Title adjusts clothes & reaches 2" anteriorly while standing with RW support with minimal assist.  (Target Date: 09/20/2015)   Time 3   Period Months   Status New   PT LONG TERM GOAL #5   Title ambulate 37' with rolling walker & prostheses with minimal assist.  (Target Date: 09/20/2015)   Time 3   Period Months   Status New               Plan - 07/03/15 1230    Clinical Impression Statement Patient and wife appear to understand better how to use NuStep & leg press at [facility] and benefits. Patient improved sit to stand at [facility] with instruction and practice but too much assistance for wife to perfrom safety.    Pt will benefit from skilled therapeutic intervention in order to improve on the following deficits Abnormal gait;Decreased activity tolerance;Decreased balance;Decreased endurance;Decreased mobility;Decreased range of motion;Decreased strength;Postural dysfunction;Prosthetic Dependency   Rehab Potential Good   PT Frequency 2x / week   PT Duration Other (comment)  13 weeks (90 days)   PT Treatment/Interventions ADLs/Self Care Home Management;DME Instruction;Gait training;Stair training;Functional mobility training;Therapeutic activities;Therapeutic exercise;Balance training;Neuromuscular  re-education;Patient/family education;Prosthetic Training   PT Next Visit Plan Instruct on sink HEP and work on sit to stand and stand pivot transfers from [facility] to Chair with armrests. Continue to reinforce exercise at [facility] center.    Consulted and Agree with Plan of Care Patient;Family member/caregiver   Family Member Consulted wife        Problem List Patient Active Problem List   Diagnosis Date Noted  . Diarrhea 08/14/2014  . Dehydration 10/02/2013  . Fever 10/02/2013  . Altered mental status 10/01/2013  . Encephalopathy, toxic 10/01/2013  . Encephalopathy, metabolic 99991111  . CVA (cerebral vascular accident) ([place]) 09/28/2013  . FTT (failure to thrive) in adult 09/28/2013  . Acute confusional state 09/28/2013  . Ulcer of sacral region, stage 3 ([place]) 09/26/2013  . S/P BKA (below knee amputation) bilateral ([place]) 09/08/2013  . ESRD on hemodialysis ([place]) 05/09/2013  . TIA (transient ischemic attack) 02/20/2013  . Acute blood loss anemia 10/28/2012  . Chronic combined systolic (EF [id number]) and grade 2diastolic congestive heart failure 10/28/2012  . Obstipation 10/28/2012  . GI bleed 10/27/2012  . Mass in rectum 10/27/2012  . DM (diabetes mellitus) type I controlled with renal manifestation ([place]) 09/08/2012  . LVH (left ventricular hypertrophy)-severe concentric 06/13/2012  . Anemia due to chronic illness 06/12/2012  . Hyperlipidemia 01/30/2011  . CHOLELITHIASIS 08/01/2010  . BENIGN PROSTATIC HYPERTROPHY 08/01/2010  . CEREBROVASCULAR ACCIDENT, HX OF 08/06/2009  . Depression 03/18/2009  . Acute combined systolic and diastolic heart failure, NYHA class 2-EF 45% 03/18/2009  . NEPHROLITHIASIS, HX OF 03/18/2009  . Morbid obesity ([place]) 03/25/2007  . Essential hypertension 03/25/2007  . GERD 03/25/2007  . Hepatitis C 03/25/2007    Kais Monje PT, DPT 07/03/2015, 7:41 PM  [facility] [address] [place], [place],  [address] Phone: [phone]   Fax:  [phone]  Name: Oscar Castillo MRN: [id number] Date of Birth: 1951-02-16

## 2015-07-04 DIAGNOSIS — D631 Anemia in chronic kidney disease: Secondary | ICD-10-CM | POA: Diagnosis not present

## 2015-07-04 DIAGNOSIS — N2581 Secondary hyperparathyroidism of renal origin: Secondary | ICD-10-CM | POA: Diagnosis not present

## 2015-07-04 DIAGNOSIS — N186 End stage renal disease: Secondary | ICD-10-CM | POA: Diagnosis not present

## 2015-07-04 DIAGNOSIS — E1122 Type 2 diabetes mellitus with diabetic chronic kidney disease: Secondary | ICD-10-CM | POA: Diagnosis not present

## 2015-07-05 ENCOUNTER — Ambulatory Visit: Payer: Medicare Other | Admitting: Physical Therapy

## 2015-07-05 ENCOUNTER — Encounter: Payer: Self-pay | Admitting: Physical Therapy

## 2015-07-05 DIAGNOSIS — R2991 Unspecified symptoms and signs involving the musculoskeletal system: Secondary | ICD-10-CM

## 2015-07-05 DIAGNOSIS — Z7409 Other reduced mobility: Secondary | ICD-10-CM

## 2015-07-05 DIAGNOSIS — R6889 Other general symptoms and signs: Secondary | ICD-10-CM

## 2015-07-05 DIAGNOSIS — R531 Weakness: Secondary | ICD-10-CM | POA: Diagnosis not present

## 2015-07-05 DIAGNOSIS — R5381 Other malaise: Secondary | ICD-10-CM

## 2015-07-05 DIAGNOSIS — M24669 Ankylosis, unspecified knee: Secondary | ICD-10-CM

## 2015-07-05 DIAGNOSIS — R29818 Other symptoms and signs involving the nervous system: Secondary | ICD-10-CM | POA: Diagnosis not present

## 2015-07-05 DIAGNOSIS — R2689 Other abnormalities of gait and mobility: Secondary | ICD-10-CM

## 2015-07-05 DIAGNOSIS — R269 Unspecified abnormalities of gait and mobility: Secondary | ICD-10-CM | POA: Diagnosis not present

## 2015-07-05 DIAGNOSIS — Z89512 Acquired absence of left leg below knee: Secondary | ICD-10-CM | POA: Diagnosis not present

## 2015-07-05 DIAGNOSIS — Z89511 Acquired absence of right leg below knee: Secondary | ICD-10-CM | POA: Diagnosis not present

## 2015-07-05 NOTE — Therapy (Signed)
[facility] [address] [place], [place], [address] Phone: [phone]   Fax:  [phone]  Physical Therapy Treatment  Patient Details  Name: Oscar Castillo MRN: [id number] Date of Birth: 12-08-1950 Referring Provider: [name], MD  Encounter Date: 07/05/2015   07/05/15 1320  PT Visits / Re-Eval  Visit Number 4  Number of Visits 26  Date for PT Re-Evaluation 09/20/15  Authorization  Authorization Type Medicare Do G-Code & progress reports every 10 visits  PT Time Calculation  PT Start Time [id number]  PT Stop Time 1358  PT Time Calculation (min) 41 min  PT - End of Session  Equipment Utilized During Treatment Gait belt  Activity Tolerance Patient tolerated treatment well  Behavior During Therapy [facility] for tasks assessed/performed      Past Medical History  Diagnosis Date  . ESRD on hemodialysis ([place]) 05/05/2007    ESRD due to DM/HTN. Started dialysis in November 2013.  HD TTS at [facility] on [place].  [name] BACK PAIN, LUMBAR, CHRONIC 08/06/2009  . BENIGN PROSTATIC HYPERTROPHY 08/01/2010  . CEREBROVASCULAR ACCIDENT, HX OF 08/06/2009  . CHOLELITHIASIS 08/01/2010  . CONGESTIVE HEART FAILURE 03/18/2009  . DEPRESSION 03/18/2009  . DIABETES MELLITUS, TYPE II 03/25/2007  . ERECTILE DYSFUNCTION 03/25/2007  . GERD 03/25/2007  . HEPATITIS C, HX OF 03/25/2007  . HYPERTENSION 03/25/2007  . Morbid obesity ([place]) 03/25/2007  . NEPHROLITHIASIS, HX OF 03/18/2009  . Complication of anesthesia     wife states pt had trouble waking up with his last surgery in Nov., 2014  . History of Clostridium difficile   . Hemorrhoids   . Anemia   . Antral ulcer 2014    small    Past Surgical History  Procedure Laterality Date  . Nephrectomy      partial RR  . Av fistula placement  06/14/2012    Procedure: ARTERIOVENOUS (AV) FISTULA CREATION;  Surgeon: Angelia Mould, MD;  Location: [facility] OR;  Service: Vascular;  Laterality: Left;  Left  basilic vein transposition with fistula.  . Tibia im nail insertion Left 09/09/2012    Procedure: INTRAMEDULLARY (IM) NAIL TIBIAL;  Surgeon: Johnny Bridge, MD;  Location: [place];  Service: Orthopedics;  Laterality: Left;  left tibial nail and open reduction internal fixation left fibula fracture  . Orif fibula fracture Left 09/09/2012    Procedure: OPEN REDUCTION INTERNAL FIXATION (ORIF) FIBULA FRACTURE;  Surgeon: Johnny Bridge, MD;  Location: [place];  Service: Orthopedics;  Laterality: Left;  . Colonoscopy N/A 10/28/2012    Procedure: COLONOSCOPY;  Surgeon: Jeryl Columbia, MD;  Location: [facility] ENDOSCOPY;  Service: Endoscopy;  Laterality: N/A;  . Esophagogastroduodenoscopy N/A 11/02/2012    Procedure: ESOPHAGOGASTRODUODENOSCOPY (EGD);  Surgeon: Cleotis Nipper, MD;  Location: [facility] ENDOSCOPY;  Service: Endoscopy;  Laterality: N/A;  . Colonoscopy N/A 11/02/2012    Procedure: COLONOSCOPY;  Surgeon: Cleotis Nipper, MD;  Location: [facility] ENDOSCOPY;  Service: Endoscopy;  Laterality: N/A;  . Colonoscopy N/A 11/03/2012    Procedure: COLONOSCOPY;  Surgeon: Cleotis Nipper, MD;  Location: [facility] ENDOSCOPY;  Service: Endoscopy;  Laterality: N/A;  . Givens capsule study N/A 11/04/2012    Procedure: GIVENS CAPSULE STUDY;  Surgeon: Cleotis Nipper, MD;  Location: [facility] ENDOSCOPY;  Service: Endoscopy;  Laterality: N/A;  . Enteroscopy N/A 11/08/2012    Procedure: ENTEROSCOPY;  Surgeon: Wonda Horner, MD;  Location: [facility] ENDOSCOPY;  Service: Endoscopy;  Laterality: N/A;  . Amputation Left 05/12/2013  Procedure: AMPUTATION RAY;  Surgeon: Newt Minion, MD;  Location: [place];  Service: Orthopedics;  Laterality: Left;  Left Foot 1st Ray Amputation  . Eye surgery Left     to remove scar tissue  . Amputation Left 06/09/2013    Procedure: AMPUTATION BELOW KNEE;  Surgeon: Newt Minion, MD;  Location: [place];  Service: Orthopedics;  Laterality: Left;  Left Below Knee Amputation and removal proximal screws IM tibial nail  . Hardware removal  Left 06/09/2013    Procedure: HARDWARE REMOVAL;  Surgeon: Newt Minion, MD;  Location: [place];  Service: Orthopedics;  Laterality: Left;  Left Below Knee Amputation  and Removal proximal screws IM tibial nail  . Amputation Right 09/08/2013    Procedure: AMPUTATION BELOW KNEE;  Surgeon: Newt Minion, MD;  Location: [place];  Service: Orthopedics;  Laterality: Right;  Right Below Knee Amputation  . Amputation Right 10/11/2013    Procedure: AMPUTATION BELOW KNEE;  Surgeon: Newt Minion, MD;  Location: [place];  Service: Orthopedics;  Laterality: Right;  Right Below Knee Amputation Revision    There were no vitals filed for this visit.  Visit Diagnosis:   Generalized weakness     Decreased functional activity tolerance    Abnormality of gait   .  Balance problems     Decreased range of knee movement, unspecified laterality     Impaired transfers     Impaired functional mobility and activity tolerance     Debility         Subjective Assessment - 07/05/15 1319    Subjective No new complaints. Still going to [facility] 1 x week.    Currently in Pain? No/denies        07/05/15 0001  Transfers  Transfers Sit to Stand;Stand to Sit;Squat Pivot Transfers;Supine to Sit  Sit to Stand 2: Max assist;With upper extremity assist;With armrests;From chair/3-in-1 (x 3 reps wheelchair<>sink)  Sit to Stand Details Verbal cues for sequencing;Verbal cues for technique;Tactile cues for weight shifting;Tactile cues for posture;Tactile cues for sequencing;Manual facilitation for weight shifting  Sit to Stand Details (indicate cue type and reason) to sink x 3 reps with max assist. yoga blocks used at bil knees (between knees and cabinets) to promote knee extension and offer additional support. Pt only able to stand a max of 10 sec's with bil UE support and max cues on posture with standing.                                        Stand to Sit 3: Mod assist;With upper extremity assist;To  chair/3-in-1;With armrests  Stand to Sit Details (indicate cue type and reason) Verbal cues for precautions/safety;Verbal cues for technique;Verbal cues for sequencing;Manual facilitation for weight shifting;Tactile cues for weight shifting;Tactile cues for posture;Tactile cues for sequencing  Stand to Sit Details mod assist needed to control descent and sit safety  to wheelchair after each stancd  Prosthetics  Current prosthetic wear tolerance (days/week)  7 days  Current prosthetic wear tolerance (#hours/day)  90% of awake hours, including dialysis  Residual limb condition  intact per pt report     Exercises: Seated passive hamstring stretch with gentle overpressure to knee, 1 minute hold each leg Supine: hip flexor stretch over edge of mat, 1 minute holds x 3 reps each leg Bridge with arms at sides on mat x 10 reps.   Transfers  continued: Supervision for lateral transfers wheelchair <>mat. Min assist with rolling and for side lying<>sitting edge of mat. Increased time due to slow mobility.        PT Short Term Goals - 06/28/15 1251    PT SHORT TERM GOAL #1   Title Patient sit to /from stand w/c to sink with moderate assist. (Target Date: 07/26/2015)   Time 1   Period Months   Status New   PT SHORT TERM GOAL #2   Title Patient & wife demonstrate / verbalize HEP / updated exercise program for fitness center. (Target Date: 07/26/2015)   Time 1   Period Months   Status New   PT SHORT TERM GOAL #3   Title Patient ambulates 71' with RW & prostheses with moderate assist (2 people for safety) (Target Date: 07/26/2015)   Time 1   Period Months   Status New           PT Long Term Goals - 06/28/15 1251    PT LONG TERM GOAL #1   Title Patient and wife demonstrate proper prosthetic care. (Target Date: 09/20/2015)   Time 3   Period Months   Status New   PT LONG TERM GOAL #2   Title Pt & wife verbalize & demonstrate ongoing HEP / fitness plan.  (Target Date: 09/20/2015)   Time 3    Period Months   Status New   PT LONG TERM GOAL #3   Title stand pivot transfers with rolling walker & prostheses with supervision.  (Target Date: 09/20/2015)   Time 3   Period Months   Status New   PT LONG TERM GOAL #4   Title adjusts clothes & reaches 2" anteriorly while standing with RW support with minimal assist.  (Target Date: 09/20/2015)   Time 3   Period Months   Status New   PT LONG TERM GOAL #5   Title ambulate 26' with rolling walker & prostheses with minimal assist.  (Target Date: 09/20/2015)   Time 3   Period Months   Status New        07/05/15 1320  Plan  Clinical Impression Statement Worked on standing at sink in today's session with pt needing up to max assist and only standing for max of 10 seconds each time. Redirected emphaiss of session to stretching of bil hip flexors, knee extensors to assist with upright posture for increased standing times/tolerance. Pt making steady progress toward goals.                                                    Pt will benefit from skilled therapeutic intervention in order to improve on the following deficits Abnormal gait;Decreased activity tolerance;Decreased balance;Decreased endurance;Decreased mobility;Decreased range of motion;Decreased strength;Postural dysfunction;Prosthetic Dependency  Rehab Potential Good  PT Frequency 2x / week  PT Duration Other (comment) (13 weeks (90 days))  PT Treatment/Interventions ADLs/Self Care Home Management;DME Instruction;Gait training;Stair training;Functional mobility training;Therapeutic activities;Therapeutic exercise;Balance training;Neuromuscular re-education;Patient/family education;Prosthetic Training  PT Next Visit Plan Instruct on sink HEP when able to maintain standing and work on sit to stand and stand pivot transfers from [facility] to Chair with armrests. [facility].   Consulted and Agree with Plan of Care Patient;Family member/caregiver  Family Member  Consulted wife  Problem List Patient Active Problem List   Diagnosis Date Noted  . Diarrhea 08/14/2014  . Dehydration 10/02/2013  . Fever 10/02/2013  . Altered mental status 10/01/2013  . Encephalopathy, toxic 10/01/2013  . Encephalopathy, metabolic [id number]  . CVA (cerebral vascular accident) ([place]) 09/28/2013  . FTT (failure to thrive) in adult 09/28/2013  . Acute confusional state 09/28/2013  . Ulcer of sacral region, stage 3 ([place]) 09/26/2013  . S/P BKA (below knee amputation) bilateral ([place]) 09/08/2013  . ESRD on hemodialysis ([place]) 05/09/2013  . TIA (transient ischemic attack) 02/20/2013  . Acute blood loss anemia 10/28/2012  . Chronic combined systolic (EF [id number]) and grade 2diastolic congestive heart failure 10/28/2012  . Obstipation 10/28/2012  . GI bleed 10/27/2012  . Mass in rectum 10/27/2012  . DM (diabetes mellitus) type I controlled with renal manifestation ([place]) 09/08/2012  . LVH (left ventricular hypertrophy)-severe concentric 06/13/2012  . Anemia due to chronic illness 06/12/2012  . Hyperlipidemia 01/30/2011  . CHOLELITHIASIS 08/01/2010  . BENIGN PROSTATIC HYPERTROPHY 08/01/2010  . CEREBROVASCULAR ACCIDENT, HX OF 08/06/2009  . Depression 03/18/2009  . Acute combined systolic and diastolic heart failure, NYHA class 2-EF 45% 03/18/2009  . NEPHROLITHIASIS, HX OF 03/18/2009  . Morbid obesity ([place]) 03/25/2007  . Essential hypertension 03/25/2007  . GERD 03/25/2007  . Hepatitis C 03/25/2007    Willow Ora 07/05/2015, 1:45 PM  Willow Ora, PTA, [facility] [address] [place], [place] [address] [phone] 07/05/2015, 1:45 PM   Name: Oscar Castillo MRN: [id number] Date of Birth: August 07, 1950

## 2015-07-06 DIAGNOSIS — N2581 Secondary hyperparathyroidism of renal origin: Secondary | ICD-10-CM | POA: Diagnosis not present

## 2015-07-06 DIAGNOSIS — E1122 Type 2 diabetes mellitus with diabetic chronic kidney disease: Secondary | ICD-10-CM | POA: Diagnosis not present

## 2015-07-06 DIAGNOSIS — D631 Anemia in chronic kidney disease: Secondary | ICD-10-CM | POA: Diagnosis not present

## 2015-07-06 DIAGNOSIS — N186 End stage renal disease: Secondary | ICD-10-CM | POA: Diagnosis not present

## 2015-07-09 DIAGNOSIS — E1122 Type 2 diabetes mellitus with diabetic chronic kidney disease: Secondary | ICD-10-CM | POA: Diagnosis not present

## 2015-07-09 DIAGNOSIS — D631 Anemia in chronic kidney disease: Secondary | ICD-10-CM | POA: Diagnosis not present

## 2015-07-09 DIAGNOSIS — N186 End stage renal disease: Secondary | ICD-10-CM | POA: Diagnosis not present

## 2015-07-09 DIAGNOSIS — N2581 Secondary hyperparathyroidism of renal origin: Secondary | ICD-10-CM | POA: Diagnosis not present

## 2015-07-10 ENCOUNTER — Other Ambulatory Visit: Payer: Self-pay | Admitting: Internal Medicine

## 2015-07-10 ENCOUNTER — Encounter: Payer: Self-pay | Admitting: Internal Medicine

## 2015-07-10 ENCOUNTER — Ambulatory Visit (INDEPENDENT_AMBULATORY_CARE_PROVIDER_SITE_OTHER): Payer: Medicare Other | Admitting: Internal Medicine

## 2015-07-10 ENCOUNTER — Ambulatory Visit: Payer: Medicare Other | Admitting: Physical Therapy

## 2015-07-10 VITALS — BP 106/74 | HR 80 | Temp 98.8°F

## 2015-07-10 DIAGNOSIS — B182 Chronic viral hepatitis C: Secondary | ICD-10-CM | POA: Diagnosis not present

## 2015-07-10 DIAGNOSIS — Z23 Encounter for immunization: Secondary | ICD-10-CM

## 2015-07-10 DIAGNOSIS — I639 Cerebral infarction, unspecified: Secondary | ICD-10-CM | POA: Diagnosis not present

## 2015-07-10 NOTE — Progress Notes (Signed)
[facility] for Infectious Disease   CC: consideration for treatment for chronic hepatitis C  HPI:  +Oscar Castillo is a 64 y.o. male who presents for initial evaluation and management of chronic hepatitis C.  Patient tested positive earlier this year. Hepatitis C-associated risk factors present are: none. Patient denies history of blood transfusion, intranasal drug use, IV drug abuse, renal dialysis, sexual contact with person with liver disease, tattoos. Patient has had other studies performed. Results: hepatitis C RNA by PCR, result: positive. Patient has not had prior treatment for Hepatitis C. Patient does not have a past history of liver disease. Patient does not have a family history of liver disease. Patient does not  have associated signs or symptoms related to liver disease.  Labs reviewed and confirm chronic hepatitis C with a positive viral load.   Records reviewed from [facility] via [facility], he is not considered a candidate for transplant and was not listed.  The reason was hepatitis C, vascular disease and poor support.  In my discussion with him, he only mentions that the hepatitis C needs treatment for the transplant.    He had some labs but does not have a genotype done.      Patient does not have documented immunity to Hepatitis A. Patient does not have documented immunity to Hepatitis B.    Review of Systems:  Constitutional: negative for fatigue and malaise Gastrointestinal: negative for diarrhea Musculoskeletal: negative for myalgias and arthralgias All other systems reviewed and are negative      Past Medical History  Diagnosis Date  . ESRD on hemodialysis ([place]) 05/05/2007    ESRD due to DM/HTN. Started dialysis in November 2013.  HD TTS at [facility] on [address].  [name] BACK PAIN, LUMBAR, CHRONIC 08/06/2009  . BENIGN PROSTATIC HYPERTROPHY 08/01/2010  . CEREBROVASCULAR ACCIDENT, HX OF 08/06/2009  . CHOLELITHIASIS 08/01/2010  . CONGESTIVE HEART FAILURE 03/18/2009    . DEPRESSION 03/18/2009  . DIABETES MELLITUS, TYPE II 03/25/2007  . ERECTILE DYSFUNCTION 03/25/2007  . GERD 03/25/2007  . HEPATITIS C, HX OF 03/25/2007  . HYPERTENSION 03/25/2007  . Morbid obesity ([place]) 03/25/2007  . NEPHROLITHIASIS, HX OF 03/18/2009  . Complication of anesthesia     wife states pt had trouble waking up with his last surgery in Nov., 2014  . History of Clostridium difficile   . Hemorrhoids   . Anemia   . Antral ulcer 2014    small    Prior to Admission medications   Medication Sig Start Date End Date Taking? Authorizing Provider  acetaminophen (TYLENOL) 500 MG tablet Take 500 mg by mouth every 6 (six) hours as needed for mild pain.   Yes Historical Provider, MD  amLODipine (NORVASC) 10 MG tablet Take 1 tablet (10 mg total) by mouth daily. 07/24/14  Yes [name], MD  calcium acetate (PHOSLO) 667 MG capsule Take 2 capsules (1,334 mg total) by mouth 3 (three) times daily with meals. 06/21/12  Yes Sorin June Leap, MD  clopidogrel (PLAVIX) 75 MG tablet Take 1 tablet (75 mg total) by mouth daily. 07/24/14  Yes [name], MD  hydrALAZINE (APRESOLINE) 100 MG tablet Take 1 tablet (100 mg total) by mouth every 8 (eight) hours. 09/10/14  Yes [name], MD  labetalol (NORMODYNE) 300 MG tablet Take 1 tablet (300 mg total) by mouth 3 (three) times daily. 07/24/14  Yes [name], MD  lidocaine-prilocaine (EMLA) cream APPLY A SMALL AMOUNT TO SKIN AT THE  ACCESS SITE (AVF) AS DIRECTED BEFORE EACH DIALYSIS SESSION THREE DAYS A WEEK 06/03/15  Yes Historical Provider, MD  PREVIDENT 5000 PLUS 1.1 % CREA dental cream See admin instructions. 04/24/15  Yes Historical Provider, MD  SENSIPAR 30 MG tablet Take 1 tablet by mouth every evening. 01/09/15  Yes Historical Provider, MD  silver sulfADIAZINE (SILVADENE) 1 % cream Apply 1 application topically daily.   Yes Historical Provider, MD    No Known Allergies  Social History  Substance Use Topics  . Smoking status: Never Smoker   . Smokeless  tobacco: Never Used  . Alcohol Use: No    Family History  Problem Relation Age of Onset  . Coronary artery disease Other   . Diabetes Mother   no liver cirrhosis, no liver cancer   Objective:  Constitutional: in no apparent distress and alert,  Filed Vitals:   07/10/15 1102  BP: 106/74  Pulse: 80  Temp: 98.8 F (37.1 C)   Eyes: anicteric Cardiovascular: Cor RRR and No murmurs Respiratory: CTAB; normal respiratory effort Gastrointestinal: Bowel sounds are normal, liver is not enlarged, spleen is not enlarged Musculoskeletal: bilateral amputation Skin: negatives: no rash; no porphyria cutanea tarda Lymphatic: no cervical lymphadenopathy   Laboratory Genotype: No results found for: HCVGENOTYPE HCV viral load:  Lab Results  Component Value Date   HCVQUANT [id number]* 06/13/2015   Lab Results  Component Value Date   WBC 4.9 06/13/2015   HGB 12.3* 06/13/2015   HCT 37.4* 06/13/2015   MCV 90.3 06/13/2015   PLT 276.0 06/13/2015    Lab Results  Component Value Date   CREATININE 4.41* 06/13/2015   BUN 20 06/13/2015   NA 136 06/13/2015   K 4.2 06/13/2015   CL 95* 06/13/2015   CO2 30 06/13/2015    Lab Results  Component Value Date   ALT 22 06/13/2015   AST 19 06/13/2015   ALKPHOS 62 06/13/2015     Labs and history reviewed and show CHILD-PUGH A  5-6 points: Child class A 7-9 points: Child class B 10-15 points: Child class C  Lab Results  Component Value Date   INR 1.02 10/23/2013   BILITOT 0.5 06/13/2015   ALBUMIN 4.0 06/13/2015     Assessment: New Patient with Chronic Hepatitis C genotype unknown, untreated.  I discussed with the patient the lab findings that confirm chronic hepatitis C as well as the natural history and progression of disease including about 30% of people who develop cirrhosis of the liver if left untreated and once cirrhosis is established there is a 2-7% risk per year of liver cancer and liver failure.  I discussed the importance of  treatment and benefits in reducing the risk, even if significant liver fibrosis exists.   Plan: 1) Patient counseled extensively on limiting acetaminophen to no more than 2 grams daily, avoidance of alcohol. 2) Transmission discussed with patient including sexual transmission, sharing razors and toothbrush.   3) Will need referral to gastroenterology if concern for cirrhosis 4) Will need referral for substance abuse counseling: No.; Further work up to include urine drug screen  No. 5) Will prescribe Zepatier for 12 weeks or 16 weeks with ribavirin if any NS5A resistance found, if he is genotype 1 6) Hepatitis A vaccine Yes.   7) Hepatitis B vaccine Yes.   8) Pneumovax vaccine if concern for cirrhosis 9) Further work up to include liver staging with elastography 10) NS5A test  Yes.   11) will follow up after elastography  12) willl  need genotype to consider treatment since Zepatier only indicated in genotype 1 or 4 and is there are no options for treatment for other genotypes outside of interferon with ribavirin, which I do not feel he would tolerate well.

## 2015-07-11 DIAGNOSIS — N186 End stage renal disease: Secondary | ICD-10-CM | POA: Diagnosis not present

## 2015-07-11 DIAGNOSIS — E1122 Type 2 diabetes mellitus with diabetic chronic kidney disease: Secondary | ICD-10-CM | POA: Diagnosis not present

## 2015-07-11 DIAGNOSIS — N2581 Secondary hyperparathyroidism of renal origin: Secondary | ICD-10-CM | POA: Diagnosis not present

## 2015-07-11 DIAGNOSIS — D631 Anemia in chronic kidney disease: Secondary | ICD-10-CM | POA: Diagnosis not present

## 2015-07-11 LAB — HIV ANTIBODY (ROUTINE TESTING W REFLEX): HIV: NONREACTIVE

## 2015-07-11 LAB — PROTIME-INR
INR: 1.11 (ref <1.50)
Prothrombin Time: 14.4 seconds (ref 11.6–15.2)

## 2015-07-11 LAB — HEPATITIS B SURFACE ANTIGEN: HEP B S AG: NEGATIVE

## 2015-07-11 LAB — HEPATITIS B CORE ANTIBODY, TOTAL: Hep B Core Total Ab: NONREACTIVE

## 2015-07-11 LAB — HEPATITIS A ANTIBODY, TOTAL: HEP A TOTAL AB: REACTIVE — AB

## 2015-07-11 LAB — HEPATITIS B SURFACE ANTIBODY,QUALITATIVE: HEP B S AB: NEGATIVE

## 2015-07-12 ENCOUNTER — Ambulatory Visit: Payer: Medicare Other | Admitting: Physical Therapy

## 2015-07-12 ENCOUNTER — Encounter: Payer: Self-pay | Admitting: Physical Therapy

## 2015-07-12 DIAGNOSIS — R5381 Other malaise: Secondary | ICD-10-CM

## 2015-07-12 DIAGNOSIS — R531 Weakness: Secondary | ICD-10-CM | POA: Diagnosis not present

## 2015-07-12 DIAGNOSIS — R6889 Other general symptoms and signs: Secondary | ICD-10-CM

## 2015-07-12 DIAGNOSIS — R2689 Other abnormalities of gait and mobility: Secondary | ICD-10-CM

## 2015-07-12 DIAGNOSIS — Z89511 Acquired absence of right leg below knee: Secondary | ICD-10-CM | POA: Diagnosis not present

## 2015-07-12 DIAGNOSIS — R269 Unspecified abnormalities of gait and mobility: Secondary | ICD-10-CM

## 2015-07-12 DIAGNOSIS — Z7409 Other reduced mobility: Secondary | ICD-10-CM

## 2015-07-12 DIAGNOSIS — Z89512 Acquired absence of left leg below knee: Secondary | ICD-10-CM | POA: Diagnosis not present

## 2015-07-12 DIAGNOSIS — M24669 Ankylosis, unspecified knee: Secondary | ICD-10-CM

## 2015-07-12 DIAGNOSIS — R2991 Unspecified symptoms and signs involving the musculoskeletal system: Secondary | ICD-10-CM

## 2015-07-12 DIAGNOSIS — R29818 Other symptoms and signs involving the nervous system: Secondary | ICD-10-CM | POA: Diagnosis not present

## 2015-07-12 NOTE — Therapy (Signed)
[facility] [address] [place], [place], [address] Phone: [phone]   Fax:  [phone]  Physical Therapy Treatment  Patient Details  Name: Oscar Castillo MRN: [id number] Date of Birth: 03-17-1951 Referring Provider: [name], MD  Encounter Date: 07/12/2015   07/12/15 1323  PT Visits / Re-Eval  Visit Number 5  Number of Visits 26  Date for PT Re-Evaluation 09/20/15  Authorization  Authorization Type Medicare Do G-Code & progress reports every 10 visits  PT Time Calculation  PT Start Time 1319  PT Stop Time 1400  PT Time Calculation (min) 41 min  PT - End of Session  Equipment Utilized During Treatment Gait belt  Activity Tolerance Patient tolerated treatment well  Behavior During Therapy [facility] for tasks assessed/performed     Past Medical History  Diagnosis Date  . ESRD on hemodialysis ([place]) 05/05/2007    ESRD due to DM/HTN. Started dialysis in November 2013.  HD TTS at [facility] on [place].  [name] BACK PAIN, LUMBAR, CHRONIC 08/06/2009  . BENIGN PROSTATIC HYPERTROPHY 08/01/2010  . CEREBROVASCULAR ACCIDENT, HX OF 08/06/2009  . CHOLELITHIASIS 08/01/2010  . CONGESTIVE HEART FAILURE 03/18/2009  . DEPRESSION 03/18/2009  . DIABETES MELLITUS, TYPE II 03/25/2007  . ERECTILE DYSFUNCTION 03/25/2007  . GERD 03/25/2007  . HEPATITIS C, HX OF 03/25/2007  . HYPERTENSION 03/25/2007  . Morbid obesity ([place]) 03/25/2007  . NEPHROLITHIASIS, HX OF 03/18/2009  . Complication of anesthesia     wife states pt had trouble waking up with his last surgery in Nov., 2014  . History of Clostridium difficile   . Hemorrhoids   . Anemia   . Antral ulcer 2014    small    Past Surgical History  Procedure Laterality Date  . Nephrectomy      partial RR  . Av fistula placement  06/14/2012    Procedure: ARTERIOVENOUS (AV) FISTULA CREATION;  Surgeon: Angelia Mould, MD;  Location: [facility] OR;  Service: Vascular;  Laterality: Left;  Left  basilic vein transposition with fistula.  . Tibia im nail insertion Left 09/09/2012    Procedure: INTRAMEDULLARY (IM) NAIL TIBIAL;  Surgeon: Johnny Bridge, MD;  Location: [place];  Service: Orthopedics;  Laterality: Left;  left tibial nail and open reduction internal fixation left fibula fracture  . Orif fibula fracture Left 09/09/2012    Procedure: OPEN REDUCTION INTERNAL FIXATION (ORIF) FIBULA FRACTURE;  Surgeon: Johnny Bridge, MD;  Location: [place];  Service: Orthopedics;  Laterality: Left;  . Colonoscopy N/A 10/28/2012    Procedure: COLONOSCOPY;  Surgeon: Jeryl Columbia, MD;  Location: [facility] ENDOSCOPY;  Service: Endoscopy;  Laterality: N/A;  . Esophagogastroduodenoscopy N/A 11/02/2012    Procedure: ESOPHAGOGASTRODUODENOSCOPY (EGD);  Surgeon: Cleotis Nipper, MD;  Location: [facility] ENDOSCOPY;  Service: Endoscopy;  Laterality: N/A;  . Colonoscopy N/A 11/02/2012    Procedure: COLONOSCOPY;  Surgeon: Cleotis Nipper, MD;  Location: [facility] ENDOSCOPY;  Service: Endoscopy;  Laterality: N/A;  . Colonoscopy N/A 11/03/2012    Procedure: COLONOSCOPY;  Surgeon: Cleotis Nipper, MD;  Location: [facility] ENDOSCOPY;  Service: Endoscopy;  Laterality: N/A;  . Givens capsule study N/A 11/04/2012    Procedure: GIVENS CAPSULE STUDY;  Surgeon: Cleotis Nipper, MD;  Location: [facility] ENDOSCOPY;  Service: Endoscopy;  Laterality: N/A;  . Enteroscopy N/A 11/08/2012    Procedure: ENTEROSCOPY;  Surgeon: Wonda Horner, MD;  Location: [facility] ENDOSCOPY;  Service: Endoscopy;  Laterality: N/A;  . Amputation Left 05/12/2013  Procedure: AMPUTATION RAY;  Surgeon: Newt Minion, MD;  Location: [facility];  Service: Orthopedics;  Laterality: Left;  Left Foot 1st Ray Amputation  . Eye surgery Left     to remove scar tissue  . Amputation Left 06/09/2013    Procedure: AMPUTATION BELOW KNEE;  Surgeon: Newt Minion, MD;  Location: [place];  Service: Orthopedics;  Laterality: Left;  Left Below Knee Amputation and removal proximal screws IM tibial nail  . Hardware removal  Left 06/09/2013    Procedure: HARDWARE REMOVAL;  Surgeon: Newt Minion, MD;  Location: [place];  Service: Orthopedics;  Laterality: Left;  Left Below Knee Amputation  and Removal proximal screws IM tibial nail  . Amputation Right 09/08/2013    Procedure: AMPUTATION BELOW KNEE;  Surgeon: Newt Minion, MD;  Location: [place];  Service: Orthopedics;  Laterality: Right;  Right Below Knee Amputation  . Amputation Right 10/11/2013    Procedure: AMPUTATION BELOW KNEE;  Surgeon: Newt Minion, MD;  Location: [facility];  Service: Orthopedics;  Laterality: Right;  Right Below Knee Amputation Revision    There were no vitals filed for this visit.  Visit Diagnosis:   Generalized weakness    Decreased functional activity tolerance     Abnormality of gait     Balance problems     Decreased range of knee movement, unspecified laterality     Impaired transfers     Impaired functional mobility and activity tolerance    Debility        Subjective Assessment - 07/12/15 1321    Subjective No new complaints. Still going to [facility] 1 x week (on [place]). Doing the home exercises some.   Currently in Pain? Yes   Pain Score 8    Pain Location Hand   Pain Orientation Right;Left   Pain Descriptors / Indicators Aching;Sore;Tightness   Pain Type Chronic pain   Pain Onset More than a month ago   Pain Frequency Intermittent   Aggravating Factors  dialysis   Pain Relieving Factors rest, stretching          [facility] Adult PT Treatment/Exercise - 07/12/15 1324    Transfers   Transfers Sit to Stand;Stand to [employer] Transfers   Sit to Stand 3: Mod assist;With upper extremity assist;With armrests;From chair/3-in-1;From bed  mod assist of 2 people to stand from lower mat w/o armrests   Stand to Sit 2: Max assist;Uncontrolled descent;With upper extremity assist;To bed   Stand Pivot Transfers 2: Max assist  with walker/bil prostheses   Stand Pivot Transfer Details (indicate cue type and  reason) wheelchair to mat table, multimodal cues on sequencing, posture and use of walker.     sit<>stand mat<>walker with with 2 person mod assist. Pt able to maintain standing for ~ 1 minute with min to mod assist for balance before needed to sit back down.  Exercises: Supine: passive hamstring stretch with gentle overpressure to knee, 2 minute hold each leg passive hip adductor stretch concurrent with knee extension stretching x 1 minute holds each leg Bridge with arms at sides on mat x 10 reps  seated: Hip abduction/ER with red theraband: 2 sets of 10 reps, 5 sec holds each one Hamstring curls with red band (prosthetic foot resting on pillow case on slide board to allow for easier movements with sliding), 10 reps each side, active assist to achive incrased knee flexion with last 5 reps each leg         PT Short  Term Goals - 06/28/15 1251    PT SHORT TERM GOAL #1   Title Patient sit to /from stand w/c to sink with moderate assist. (Target Date: 07/26/2015)   Time 1   Period Months   Status New   PT SHORT TERM GOAL #2   Title Patient & wife demonstrate / verbalize HEP / updated exercise program for fitness center. (Target Date: 07/26/2015)   Time 1   Period Months   Status New   PT SHORT TERM GOAL #3   Title Patient ambulates 63' with RW & prostheses with moderate assist (2 people for safety) (Target Date: 07/26/2015)   Time 1   Period Months   Status New           PT Long Term Goals - 06/28/15 1251    PT LONG TERM GOAL #1   Title Patient and wife demonstrate proper prosthetic care. (Target Date: 09/20/2015)   Time 3   Period Months   Status New   PT LONG TERM GOAL #2   Title Pt & wife verbalize & demonstrate ongoing HEP / fitness plan.  (Target Date: 09/20/2015)   Time 3   Period Months   Status New   PT LONG TERM GOAL #3   Title stand pivot transfers with rolling walker & prostheses with supervision.  (Target Date: 09/20/2015)   Time 3   Period Months   Status  New   PT LONG TERM GOAL #4   Title adjusts clothes & reaches 2" anteriorly while standing with RW support with minimal assist.  (Target Date: 09/20/2015)   Time 3   Period Months   Status New   PT LONG TERM GOAL #5   Title ambulate 52' with rolling walker & prostheses with minimal assist.  (Target Date: 09/20/2015)   Time 3   Period Months   Status New        07/12/15 1324  Plan  Clinical Impression Statement Pt is making steady progress toward goals with increased stading time today. Still need additional practice before he can safely standi at home with spouse to perform sink exercises.  Pt will benefit from skilled therapeutic intervention in order to improve on the following deficits Abnormal gait;Decreased activity tolerance;Decreased balance;Decreased endurance;Decreased mobility;Decreased range of motion;Decreased strength;Postural dysfunction;Prosthetic Dependency  Rehab Potential Good  PT Frequency 2x / week  PT Duration Other (comment) (13 weeks (90 days))  PT Treatment/Interventions ADLs/Self Care Home Management;DME Instruction;Gait training;Stair training;Functional mobility training;Therapeutic activities;Therapeutic exercise;Balance training;Neuromuscular re-education;Patient/family education;Prosthetic Training  PT Next Visit Plan Instruct on sink HEP when able to maintain standing and work on sit to stand and stand pivot transfers from [facility] to Chair with armrests. Continue to reinforce exercise at [facility] center.   Consulted and Agree with Plan of Care Patient;Family member/caregiver  Family Member Consulted wife     Problem List Patient Active Problem List   Diagnosis Date Noted  . Diarrhea 08/14/2014  . Dehydration 10/02/2013  . Fever 10/02/2013  . Altered mental status 10/01/2013  . Encephalopathy, toxic 10/01/2013  . Encephalopathy, metabolic 99991111  . CVA (cerebral vascular accident) ([place]) 09/28/2013  . FTT (failure to thrive) in adult 09/28/2013  . Acute  confusional state 09/28/2013  . Ulcer of sacral region, stage 3 ([place]) 09/26/2013  . S/P BKA (below knee amputation) bilateral ([place]) 09/08/2013  . ESRD on hemodialysis ([place]) 05/09/2013  . TIA (transient ischemic attack) 02/20/2013  . Acute blood loss anemia 10/28/2012  . Chronic combined systolic (EF [id number]) and grade  2diastolic congestive heart failure 10/28/2012  . Obstipation 10/28/2012  . GI bleed 10/27/2012  . Mass in rectum 10/27/2012  . DM (diabetes mellitus) type I controlled with renal manifestation ([place]) 09/08/2012  . LVH (left ventricular hypertrophy)-severe concentric 06/13/2012  . Anemia due to chronic illness 06/12/2012  . Hyperlipidemia 01/30/2011  . CHOLELITHIASIS 08/01/2010  . BENIGN PROSTATIC HYPERTROPHY 08/01/2010  . CEREBROVASCULAR ACCIDENT, HX OF 08/06/2009  . Depression 03/18/2009  . Acute combined systolic and diastolic heart failure, NYHA class 2-EF 45% 03/18/2009  . NEPHROLITHIASIS, HX OF 03/18/2009  . Morbid obesity ([place]) 03/25/2007  . Essential hypertension 03/25/2007  . GERD 03/25/2007  . Chronic hepatitis C without hepatic coma ([place]) 03/25/2007    Willow Ora 07/12/2015, 1:43 PM  Willow Ora, PTA, [facility] [address] [place], [place] [address] [phone] 07/15/2015, 12:25 AM   Name: Oscar Castillo MRN: [id number] Date of Birth: 1951/01/30

## 2015-07-13 DIAGNOSIS — E1122 Type 2 diabetes mellitus with diabetic chronic kidney disease: Secondary | ICD-10-CM | POA: Diagnosis not present

## 2015-07-13 DIAGNOSIS — N2581 Secondary hyperparathyroidism of renal origin: Secondary | ICD-10-CM | POA: Diagnosis not present

## 2015-07-13 DIAGNOSIS — N186 End stage renal disease: Secondary | ICD-10-CM | POA: Diagnosis not present

## 2015-07-13 DIAGNOSIS — D631 Anemia in chronic kidney disease: Secondary | ICD-10-CM | POA: Diagnosis not present

## 2015-07-13 LAB — HCV RNA,LIPA RFLX NS5A DRUG RESIST

## 2015-07-15 ENCOUNTER — Other Ambulatory Visit: Payer: Self-pay | Admitting: Internal Medicine

## 2015-07-15 MED ORDER — ELBASVIR-GRAZOPREVIR 50-100 MG PO TABS: 1.0000 | ORAL_TABLET | Freq: Every day | ORAL | Status: DC

## 2015-07-16 DIAGNOSIS — D631 Anemia in chronic kidney disease: Secondary | ICD-10-CM | POA: Diagnosis not present

## 2015-07-16 DIAGNOSIS — E1122 Type 2 diabetes mellitus with diabetic chronic kidney disease: Secondary | ICD-10-CM | POA: Diagnosis not present

## 2015-07-16 DIAGNOSIS — N2581 Secondary hyperparathyroidism of renal origin: Secondary | ICD-10-CM | POA: Diagnosis not present

## 2015-07-16 DIAGNOSIS — N186 End stage renal disease: Secondary | ICD-10-CM | POA: Diagnosis not present

## 2015-07-17 ENCOUNTER — Ambulatory Visit: Payer: Medicare Other | Admitting: Physical Therapy

## 2015-07-17 ENCOUNTER — Encounter: Payer: Self-pay | Admitting: Physical Therapy

## 2015-07-17 DIAGNOSIS — R29818 Other symptoms and signs involving the nervous system: Secondary | ICD-10-CM | POA: Diagnosis not present

## 2015-07-17 DIAGNOSIS — Z89512 Acquired absence of left leg below knee: Secondary | ICD-10-CM | POA: Diagnosis not present

## 2015-07-17 DIAGNOSIS — Z7409 Other reduced mobility: Secondary | ICD-10-CM

## 2015-07-17 DIAGNOSIS — R6889 Other general symptoms and signs: Secondary | ICD-10-CM | POA: Diagnosis not present

## 2015-07-17 DIAGNOSIS — Z89511 Acquired absence of right leg below knee: Secondary | ICD-10-CM | POA: Diagnosis not present

## 2015-07-17 DIAGNOSIS — R531 Weakness: Secondary | ICD-10-CM

## 2015-07-17 DIAGNOSIS — R2689 Other abnormalities of gait and mobility: Secondary | ICD-10-CM

## 2015-07-17 DIAGNOSIS — R269 Unspecified abnormalities of gait and mobility: Secondary | ICD-10-CM | POA: Diagnosis not present

## 2015-07-17 DIAGNOSIS — M24669 Ankylosis, unspecified knee: Secondary | ICD-10-CM

## 2015-07-17 DIAGNOSIS — R2991 Unspecified symptoms and signs involving the musculoskeletal system: Secondary | ICD-10-CM

## 2015-07-17 NOTE — Therapy (Signed)
[facility] [address] [place], [place], [address] Phone: [phone]   Fax:  [phone]  Physical Therapy Treatment  Patient Details  Name: Oscar Castillo MRN: [id number] Date of Birth: 1951-04-08 Referring Provider: [name], MD  Encounter Date: 07/17/2015      PT End of Session - 07/17/15 1320    Visit Number 6   Number of Visits 26   Date for PT Re-Evaluation 09/20/15   Authorization Type Medicare Do G-Code & progress reports every 10 visits   PT Start Time 1238   PT Stop Time 1320   PT Time Calculation (min) 42 min   Equipment Utilized During Treatment Gait belt   Activity Tolerance Patient tolerated treatment well;Patient limited by pain   Behavior During Therapy [facility] for tasks assessed/performed      Past Medical History  Diagnosis Date  . ESRD on hemodialysis ([place]) 05/05/2007    ESRD due to DM/HTN. Started dialysis in November 2013.  HD TTS at [facility] on [place].  [name] BACK PAIN, LUMBAR, CHRONIC 08/06/2009  . BENIGN PROSTATIC HYPERTROPHY 08/01/2010  . CEREBROVASCULAR ACCIDENT, HX OF 08/06/2009  . CHOLELITHIASIS 08/01/2010  . CONGESTIVE HEART FAILURE 03/18/2009  . DEPRESSION 03/18/2009  . DIABETES MELLITUS, TYPE II 03/25/2007  . ERECTILE DYSFUNCTION 03/25/2007  . GERD 03/25/2007  . HEPATITIS C, HX OF 03/25/2007  . HYPERTENSION 03/25/2007  . Morbid obesity ([place]) 03/25/2007  . NEPHROLITHIASIS, HX OF 03/18/2009  . Complication of anesthesia     wife states pt had trouble waking up with his last surgery in Nov., 2014  . History of Clostridium difficile   . Hemorrhoids   . Anemia   . Antral ulcer 2014    small    Past Surgical History  Procedure Laterality Date  . Nephrectomy      partial RR  . Av fistula placement  06/14/2012    Procedure: ARTERIOVENOUS (AV) FISTULA CREATION;  Surgeon: Angelia Mould, MD;  Location: [facility] OR;  Service: Vascular;  Laterality: Left;  Left basilic vein transposition  with fistula.  . Tibia im nail insertion Left 09/09/2012    Procedure: INTRAMEDULLARY (IM) NAIL TIBIAL;  Surgeon: Johnny Bridge, MD;  Location: [place];  Service: Orthopedics;  Laterality: Left;  left tibial nail and open reduction internal fixation left fibula fracture  . Orif fibula fracture Left 09/09/2012    Procedure: OPEN REDUCTION INTERNAL FIXATION (ORIF) FIBULA FRACTURE;  Surgeon: Johnny Bridge, MD;  Location: [place];  Service: Orthopedics;  Laterality: Left;  . Colonoscopy N/A 10/28/2012    Procedure: COLONOSCOPY;  Surgeon: Jeryl Columbia, MD;  Location: [facility] ENDOSCOPY;  Service: Endoscopy;  Laterality: N/A;  . Esophagogastroduodenoscopy N/A 11/02/2012    Procedure: ESOPHAGOGASTRODUODENOSCOPY (EGD);  Surgeon: Cleotis Nipper, MD;  Location: [facility] ENDOSCOPY;  Service: Endoscopy;  Laterality: N/A;  . Colonoscopy N/A 11/02/2012    Procedure: COLONOSCOPY;  Surgeon: Cleotis Nipper, MD;  Location: [facility] ENDOSCOPY;  Service: Endoscopy;  Laterality: N/A;  . Colonoscopy N/A 11/03/2012    Procedure: COLONOSCOPY;  Surgeon: Cleotis Nipper, MD;  Location: [facility] ENDOSCOPY;  Service: Endoscopy;  Laterality: N/A;  . Givens capsule study N/A 11/04/2012    Procedure: GIVENS CAPSULE STUDY;  Surgeon: Cleotis Nipper, MD;  Location: [facility] ENDOSCOPY;  Service: Endoscopy;  Laterality: N/A;  . Enteroscopy N/A 11/08/2012    Procedure: ENTEROSCOPY;  Surgeon: Wonda Horner, MD;  Location: [facility] ENDOSCOPY;  Service: Endoscopy;  Laterality: N/A;  .  Amputation Left 05/12/2013    Procedure: AMPUTATION RAY;  Surgeon: Newt Minion, MD;  Location: [facility];  Service: Orthopedics;  Laterality: Left;  Left Foot 1st Ray Amputation  . Eye surgery Left     to remove scar tissue  . Amputation Left 06/09/2013    Procedure: AMPUTATION BELOW KNEE;  Surgeon: Newt Minion, MD;  Location: [facility];  Service: Orthopedics;  Laterality: Left;  Left Below Knee Amputation and removal proximal screws IM tibial nail  . Hardware removal Left 06/09/2013     Procedure: HARDWARE REMOVAL;  Surgeon: Newt Minion, MD;  Location: [facility];  Service: Orthopedics;  Laterality: Left;  Left Below Knee Amputation  and Removal proximal screws IM tibial nail  . Amputation Right 09/08/2013    Procedure: AMPUTATION BELOW KNEE;  Surgeon: Newt Minion, MD;  Location: [facility];  Service: Orthopedics;  Laterality: Right;  Right Below Knee Amputation  . Amputation Right 10/11/2013    Procedure: AMPUTATION BELOW KNEE;  Surgeon: Newt Minion, MD;  Location: [facility];  Service: Orthopedics;  Laterality: Right;  Right Below Knee Amputation Revision    There were no vitals filed for this visit.  Visit Diagnosis:  Generalized weakness  Decreased functional activity tolerance  Balance problems  Decreased range of knee movement, unspecified laterality  Impaired transfers      Subjective Assessment - 07/17/15 1242    Subjective He has been doing his exercises. Dialysis tech stuck his left arm 10 times and his left arm hurts so bad not sure how much he can use it today.   Patient is accompained by: Family member   Currently in Pain? Yes   Pain Score 8    Pain Location Arm   Pain Orientation Left   Pain Descriptors / Indicators Cramping   Pain Type Chronic pain   Pain Onset More than a month ago   Pain Frequency Intermittent   Aggravating Factors  dialysis   Pain Relieving Factors rest     Therapeutic Activities: Patient sit to stand with total A due to severe pain in left UE. Standing frame: 5 minutes 2 reps - UE reaching forward, overhead; lean forward & trunk extension; partial sit to stand; push up off RW handles; trunk rotation right & left; trunk side bend right & left.  3 reps Sit to stand w/c to RW with mobilization belt used to block knee flexion with moderate assist, alternate reaching 2" forward to touch wall    Therapeutic Exercise:  Sitting at edge of w/c with feet on floor without back or UE support: lean forward/trunk extension, lean back / abdominal  crunch-sit-up, rotation right & left and side bend elbow to chair bottom for safety to right & left 10 reps each movement.                           PT Education - 07/17/15 1320    Education provided Yes   Education Details sitting balance exercises   Person(s) Educated Patient;Spouse   Methods Explanation;Demonstration;Verbal cues   Comprehension Verbalized understanding;Returned demonstration;Verbal cues required;Need further instruction          PT Short Term Goals - 07/17/15 1320    PT SHORT TERM GOAL #1   Title Patient sit to /from stand w/c to sink with moderate assist. (Target Date: 07/26/2015)   Time 1   Period Months   Status On-going   PT SHORT TERM GOAL #2  Title Patient & wife demonstrate / verbalize HEP / updated exercise program for fitness center. (Target Date: 07/26/2015)   Time 1   Period Months   Status On-going   PT SHORT TERM GOAL #3   Title Patient ambulates 29' with RW & prostheses with moderate assist (2 people for safety) (Target Date: 07/26/2015)   Time 1   Period Months   Status On-going           PT Long Term Goals - 07/17/15 1320    PT LONG TERM GOAL #1   Title Patient and wife demonstrate proper prosthetic care. (Target Date: 09/20/2015)   Time 3   Period Months   Status On-going   PT LONG TERM GOAL #2   Title Pt & wife verbalize & demonstrate ongoing HEP / fitness plan.  (Target Date: 09/20/2015)   Time 3   Period Months   Status On-going   PT LONG TERM GOAL #3   Title stand pivot transfers with rolling walker & prostheses with supervision.  (Target Date: 09/20/2015)   Time 3   Period Months   Status On-going   PT LONG TERM GOAL #4   Title adjusts clothes & reaches 2" anteriorly while standing with RW support with minimal assist.  (Target Date: 09/20/2015)   Time 3   Period Months   Status On-going   PT LONG TERM GOAL #5   Title ambulate 102' with rolling walker & prostheses with minimal assist.  (Target Date:  09/20/2015)   Time 3   Period Months   Status On-going               Plan - 07/17/15 1320    Clinical Impression Statement Patient's left UE was too painful from dialysis issues to work on standing or gait at prior level. Patient & wife appear to understand recommendation for sitting balance trunk exercises.    Pt will benefit from skilled therapeutic intervention in order to improve on the following deficits Abnormal gait;Decreased activity tolerance;Decreased balance;Decreased endurance;Decreased mobility;Decreased range of motion;Decreased strength;Postural dysfunction;Prosthetic Dependency   Rehab Potential Good   PT Frequency 2x / week   PT Duration Other (comment)  13 weeks (90 days)   PT Treatment/Interventions ADLs/Self Care Home Management;DME Instruction;Gait training;Stair training;Functional mobility training;Therapeutic activities;Therapeutic exercise;Balance training;Neuromuscular re-education;Patient/family education;Prosthetic Training   PT Next Visit Plan Work towards [place].    Consulted and Agree with Plan of Care Patient;Family member/caregiver   Family Member Consulted wife        Problem List Patient Active Problem List   Diagnosis Date Noted  . Diarrhea 08/14/2014  . Dehydration 10/02/2013  . Fever 10/02/2013  . Altered mental status 10/01/2013  . Encephalopathy, toxic 10/01/2013  . Encephalopathy, metabolic 99991111  . CVA (cerebral vascular accident) ([place]) 09/28/2013  . FTT (failure to thrive) in adult 09/28/2013  . Acute confusional state 09/28/2013  . Ulcer of sacral region, stage 3 ([place]) 09/26/2013  . S/P BKA (below knee amputation) bilateral ([place]) 09/08/2013  . ESRD on hemodialysis ([place]) 05/09/2013  . TIA (transient ischemic attack) 02/20/2013  . Acute blood loss anemia 10/28/2012  . Chronic combined systolic (EF [id number]) and grade 2diastolic congestive heart failure 10/28/2012  . Obstipation 10/28/2012  . GI bleed 10/27/2012  . Mass in rectum  10/27/2012  . DM (diabetes mellitus) type I controlled with renal manifestation ([place]) 09/08/2012  . LVH (left ventricular hypertrophy)-severe concentric 06/13/2012  . Anemia due to chronic illness 06/12/2012  . Hyperlipidemia 01/30/2011  . CHOLELITHIASIS 08/01/2010  .  BENIGN PROSTATIC HYPERTROPHY 08/01/2010  . CEREBROVASCULAR ACCIDENT, HX OF 08/06/2009  . Depression 03/18/2009  . Acute combined systolic and diastolic heart failure, NYHA class 2-EF 45% 03/18/2009  . NEPHROLITHIASIS, HX OF 03/18/2009  . Morbid obesity ([place]) 03/25/2007  . Essential hypertension 03/25/2007  . GERD 03/25/2007  . Chronic hepatitis C without hepatic coma ([place]) 03/25/2007    Junell Cullifer PT, DPT 07/17/2015, 8:45 PM  [facility] [address] [place], [place], [address] Phone: [phone]   Fax:  [phone]  Name: TRAVEN GARONE MRN: [id number] Date of Birth: July 30, 1950

## 2015-07-18 DIAGNOSIS — N2581 Secondary hyperparathyroidism of renal origin: Secondary | ICD-10-CM | POA: Diagnosis not present

## 2015-07-18 DIAGNOSIS — N186 End stage renal disease: Secondary | ICD-10-CM | POA: Diagnosis not present

## 2015-07-18 DIAGNOSIS — D631 Anemia in chronic kidney disease: Secondary | ICD-10-CM | POA: Diagnosis not present

## 2015-07-18 DIAGNOSIS — E1122 Type 2 diabetes mellitus with diabetic chronic kidney disease: Secondary | ICD-10-CM | POA: Diagnosis not present

## 2015-07-19 ENCOUNTER — Ambulatory Visit: Payer: Medicare Other | Admitting: Physical Therapy

## 2015-07-19 ENCOUNTER — Encounter: Payer: Self-pay | Admitting: Physical Therapy

## 2015-07-19 DIAGNOSIS — R2689 Other abnormalities of gait and mobility: Secondary | ICD-10-CM

## 2015-07-19 DIAGNOSIS — R29818 Other symptoms and signs involving the nervous system: Secondary | ICD-10-CM | POA: Diagnosis not present

## 2015-07-19 DIAGNOSIS — R2991 Unspecified symptoms and signs involving the musculoskeletal system: Secondary | ICD-10-CM

## 2015-07-19 DIAGNOSIS — R5381 Other malaise: Secondary | ICD-10-CM

## 2015-07-19 DIAGNOSIS — Z89511 Acquired absence of right leg below knee: Secondary | ICD-10-CM | POA: Diagnosis not present

## 2015-07-19 DIAGNOSIS — R531 Weakness: Secondary | ICD-10-CM

## 2015-07-19 DIAGNOSIS — Z7409 Other reduced mobility: Secondary | ICD-10-CM

## 2015-07-19 DIAGNOSIS — Z89512 Acquired absence of left leg below knee: Secondary | ICD-10-CM | POA: Diagnosis not present

## 2015-07-19 DIAGNOSIS — R269 Unspecified abnormalities of gait and mobility: Secondary | ICD-10-CM | POA: Diagnosis not present

## 2015-07-19 DIAGNOSIS — R6889 Other general symptoms and signs: Secondary | ICD-10-CM | POA: Diagnosis not present

## 2015-07-19 DIAGNOSIS — M24669 Ankylosis, unspecified knee: Secondary | ICD-10-CM

## 2015-07-19 NOTE — Patient Instructions (Signed)
Sitting at edge of w/c with feet on floor without back or UE support:  1. lean forward (hugging your knees) and then come back up into tall sitting posture  2. lean back so that your back touches the back of the chair, and then sit back up (mini abdominal curl ups)  3. Rotate toward the left side looking over your shoulder and then rotate toward the right side looking over your shoulder, turn/rotate as far as you can without pain  4. While seated at edge of wheelchair, have 2 other chairs beside the wheelchair, one on each side. Alternate tapping elbows to seat of each chair (side bending to do so).  10 reps each side with each exercise Perform these daily

## 2015-07-19 NOTE — Therapy (Signed)
[facility] [address] [place], [place], [address] Phone: [phone]   Fax:  [phone]  Physical Therapy Treatment  Patient Details  Name: MICAHEL SELMER MRN: [id number] Date of Birth: [birth year] Referring Provider: [name], MD  Encounter Date: 07/19/2015      PT End of Session - 07/19/15 1241    Visit Number 7   Number of Visits 26   Date for PT Re-Evaluation 09/20/15   Authorization Type Medicare Do G-Code & progress reports every 10 visits   PT Start Time 1236  pt running late today   PT Stop Time 1315   PT Time Calculation (min) 39 min   Equipment Utilized During Treatment Gait belt   Activity Tolerance Patient tolerated treatment well;Patient limited by pain   Behavior During Therapy [facility] for tasks assessed/performed      Past Medical History  Diagnosis Date  . ESRD on hemodialysis ([place]) 05/05/2007    ESRD due to DM/HTN. Started dialysis in November 2013.  HD TTS at [facility] on [place].  [name] BACK PAIN, LUMBAR, CHRONIC 08/06/2009  . BENIGN PROSTATIC HYPERTROPHY 08/01/2010  . CEREBROVASCULAR ACCIDENT, HX OF 08/06/2009  . CHOLELITHIASIS 08/01/2010  . CONGESTIVE HEART FAILURE 03/18/2009  . DEPRESSION 03/18/2009  . DIABETES MELLITUS, TYPE II 03/25/2007  . ERECTILE DYSFUNCTION 03/25/2007  . GERD 03/25/2007  . HEPATITIS C, HX OF 03/25/2007  . HYPERTENSION 03/25/2007  . Morbid obesity ([place]) 03/25/2007  . NEPHROLITHIASIS, HX OF 03/18/2009  . Complication of anesthesia     wife states pt had trouble waking up with his last surgery in Nov., 2014  . History of Clostridium difficile   . Hemorrhoids   . Anemia   . Antral ulcer 2014    small    Past Surgical History  Procedure Laterality Date  . Nephrectomy      partial RR  . Av fistula placement  06/14/2012    Procedure: ARTERIOVENOUS (AV) FISTULA CREATION;  Surgeon: Angelia Mould, MD;  Location: [facility] OR;  Service: Vascular;  Laterality: Left;  Left  basilic vein transposition with fistula.  . Tibia im nail insertion Left 09/09/2012    Procedure: INTRAMEDULLARY (IM) NAIL TIBIAL;  Surgeon: Johnny Bridge, MD;  Location: [place];  Service: Orthopedics;  Laterality: Left;  left tibial nail and open reduction internal fixation left fibula fracture  . Orif fibula fracture Left 09/09/2012    Procedure: OPEN REDUCTION INTERNAL FIXATION (ORIF) FIBULA FRACTURE;  Surgeon: Johnny Bridge, MD;  Location: [place];  Service: Orthopedics;  Laterality: Left;  . Colonoscopy N/A 10/28/2012    Procedure: COLONOSCOPY;  Surgeon: Jeryl Columbia, MD;  Location: [facility] ENDOSCOPY;  Service: Endoscopy;  Laterality: N/A;  . Esophagogastroduodenoscopy N/A 11/02/2012    Procedure: ESOPHAGOGASTRODUODENOSCOPY (EGD);  Surgeon: Cleotis Nipper, MD;  Location: [facility] ENDOSCOPY;  Service: Endoscopy;  Laterality: N/A;  . Colonoscopy N/A 11/02/2012    Procedure: COLONOSCOPY;  Surgeon: Cleotis Nipper, MD;  Location: [facility] ENDOSCOPY;  Service: Endoscopy;  Laterality: N/A;  . Colonoscopy N/A 11/03/2012    Procedure: COLONOSCOPY;  Surgeon: Cleotis Nipper, MD;  Location: [facility] ENDOSCOPY;  Service: Endoscopy;  Laterality: N/A;  . Givens capsule study N/A 11/04/2012    Procedure: GIVENS CAPSULE STUDY;  Surgeon: Cleotis Nipper, MD;  Location: [facility] ENDOSCOPY;  Service: Endoscopy;  Laterality: N/A;  . Enteroscopy N/A 11/08/2012    Procedure: ENTEROSCOPY;  Surgeon: Wonda Horner, MD;  Location: [facility] ENDOSCOPY;  Service: Endoscopy;  Laterality: N/A;  . Amputation Left 05/12/2013    Procedure: AMPUTATION RAY;  Surgeon: Newt Minion, MD;  Location: [place];  Service: Orthopedics;  Laterality: Left;  Left Foot 1st Ray Amputation  . Eye surgery Left     to remove scar tissue  . Amputation Left 06/09/2013    Procedure: AMPUTATION BELOW KNEE;  Surgeon: Newt Minion, MD;  Location: [place];  Service: Orthopedics;  Laterality: Left;  Left Below Knee Amputation and removal proximal screws IM tibial nail  . Hardware removal  Left 06/09/2013    Procedure: HARDWARE REMOVAL;  Surgeon: Newt Minion, MD;  Location: [place];  Service: Orthopedics;  Laterality: Left;  Left Below Knee Amputation  and Removal proximal screws IM tibial nail  . Amputation Right 09/08/2013    Procedure: AMPUTATION BELOW KNEE;  Surgeon: Newt Minion, MD;  Location: [place];  Service: Orthopedics;  Laterality: Right;  Right Below Knee Amputation  . Amputation Right 10/11/2013    Procedure: AMPUTATION BELOW KNEE;  Surgeon: Newt Minion, MD;  Location: [place];  Service: Orthopedics;  Laterality: Right;  Right Below Knee Amputation Revision    There were no vitals filed for this visit.  Visit Diagnosis:  Generalized weakness  Decreased functional activity tolerance  Balance problems  Decreased range of knee movement, unspecified laterality  Impaired functional mobility and activity tolerance  Impaired transfers  Debility      Subjective Assessment - 07/19/15 1240    Subjective No new complaints. Still having left arm pain. No falls.   Currently in Pain? Yes   Pain Score 8    Pain Location Arm  plus ususal hand cramping/pain   Pain Orientation Left   Pain Descriptors / Indicators Aching;Cramping;Throbbing;Tightness;Sore   Pain Type Chronic pain   Pain Onset More than a month ago   Pain Frequency Intermittent   Aggravating Factors  dialysis   Pain Relieving Factors rest     Treatment: Exercises: Sitting at edge of w/c with feet on floor without back or UE support:  1. lean forward (hugging your knees) and then come back up into tall sitting posture  2. lean back so that your back touches the back of the chair, and then sit back up (mini abdominal curl ups)  3. Rotate toward the left side looking over your shoulder and then rotate toward the right side looking over your shoulder, turn/rotate as far as you can without pain  4. While seated at edge of wheelchair, have 2 other chairs beside the wheelchair, one on each side.  Alternate tapping elbows to seat of each chair (side bending to do so).  10 reps each side with each exercise Perform these daily   Ther-activity Stander x 8 minutes - alternating UE raises - cross body reaching - trunk flexion into extension Pt with slow movements, needed increased time with each rep/exercise. Cues on form and technique as well.        PT Short Term Goals - 07/17/15 1320    PT SHORT TERM GOAL #1   Title Patient sit to /from stand w/c to sink with moderate assist. (Target Date: 07/26/2015)   Time 1   Period Months   Status On-going   PT SHORT TERM GOAL #2   Title Patient & wife demonstrate / verbalize HEP / updated exercise program for fitness center. (Target Date: 07/26/2015)   Time 1   Period Months   Status On-going   PT SHORT TERM GOAL #3  Title Patient ambulates 24' with RW & prostheses with moderate assist (2 people for safety) (Target Date: 07/26/2015)   Time 1   Period Months   Status On-going           PT Long Term Goals - 07/17/15 1320    PT LONG TERM GOAL #1   Title Patient and wife demonstrate proper prosthetic care. (Target Date: 09/20/2015)   Time 3   Period Months   Status On-going   PT LONG TERM GOAL #2   Title Pt & wife verbalize & demonstrate ongoing HEP / fitness plan.  (Target Date: 09/20/2015)   Time 3   Period Months   Status On-going   PT LONG TERM GOAL #3   Title stand pivot transfers with rolling walker & prostheses with supervision.  (Target Date: 09/20/2015)   Time 3   Period Months   Status On-going   PT LONG TERM GOAL #4   Title adjusts clothes & reaches 2" anteriorly while standing with RW support with minimal assist.  (Target Date: 09/20/2015)   Time 3   Period Months   Status On-going   PT LONG TERM GOAL #5   Title ambulate 29' with rolling walker & prostheses with minimal assist.  (Target Date: 09/20/2015)   Time 3   Period Months   Status On-going           Plan - 07/19/15 1242    Clinical Impression  Statement Pt with questions on seated exercises from previous session. Reviewed them and cued pt on correct form and technique. Written instructions provided today. Continued to work on standing in [place] as pt's left arm is still very painful. Pt making slow, steady progress toward goals.                              Pt will benefit from skilled therapeutic intervention in order to improve on the following deficits Abnormal gait;Decreased activity tolerance;Decreased balance;Decreased endurance;Decreased mobility;Decreased range of motion;Decreased strength;Postural dysfunction;Prosthetic Dependency   Rehab Potential Good   PT Frequency 2x / week   PT Duration Other (comment)  13 weeks (90 days)   PT Treatment/Interventions ADLs/Self Care Home Management;DME Instruction;Gait training;Stair training;Functional mobility training;Therapeutic activities;Therapeutic exercise;Balance training;Neuromuscular re-education;Patient/family education;Prosthetic Training   PT Next Visit Plan Work towards [place].    Consulted and Agree with Plan of Care Patient;Family member/caregiver   Family Member Consulted wife        Problem List Patient Active Problem List   Diagnosis Date Noted  . Diarrhea 08/14/2014  . Dehydration 10/02/2013  . Fever 10/02/2013  . Altered mental status 10/01/2013  . Encephalopathy, toxic 10/01/2013  . Encephalopathy, metabolic [id number]  . CVA (cerebral vascular accident) ([place]) 09/28/2013  . FTT (failure to thrive) in adult 09/28/2013  . Acute confusional state 09/28/2013  . Ulcer of sacral region, stage 3 ([place]) 09/26/2013  . S/P BKA (below knee amputation) bilateral ([place]) 09/08/2013  . ESRD on hemodialysis ([place]) 05/09/2013  . TIA (transient ischemic attack) 02/20/2013  . Acute blood loss anemia 10/28/2012  . Chronic combined systolic (EF [id number]) and grade 2diastolic congestive heart failure 10/28/2012  . Obstipation 10/28/2012  . GI bleed 10/27/2012  . Mass in rectum  10/27/2012  . DM (diabetes mellitus) type I controlled with renal manifestation ([place]) 09/08/2012  . LVH (left ventricular hypertrophy)-severe concentric 06/13/2012  . Anemia due to chronic illness 06/12/2012  . Hyperlipidemia 01/30/2011  . CHOLELITHIASIS 08/01/2010  .  BENIGN PROSTATIC HYPERTROPHY 08/01/2010  . CEREBROVASCULAR ACCIDENT, HX OF 08/06/2009  . Depression 03/18/2009  . Acute combined systolic and diastolic heart failure, NYHA class 2-EF 45% 03/18/2009  . NEPHROLITHIASIS, HX OF 03/18/2009  . Morbid obesity ([place]) 03/25/2007  . Essential hypertension 03/25/2007  . GERD 03/25/2007  . Chronic hepatitis C without hepatic coma ([place]) 03/25/2007    Willow Ora 07/19/2015, 3:13 PM  Willow Ora, PTA, [facility] [address] [place],  [address] [phone] 07/19/2015, 3:13 PM   Name: ASBERRY HELMICK MRN: [id number] Date of Birth: 06-29-1951

## 2015-07-20 DIAGNOSIS — N2581 Secondary hyperparathyroidism of renal origin: Secondary | ICD-10-CM | POA: Diagnosis not present

## 2015-07-20 DIAGNOSIS — D631 Anemia in chronic kidney disease: Secondary | ICD-10-CM | POA: Diagnosis not present

## 2015-07-20 DIAGNOSIS — N186 End stage renal disease: Secondary | ICD-10-CM | POA: Diagnosis not present

## 2015-07-20 DIAGNOSIS — E1122 Type 2 diabetes mellitus with diabetic chronic kidney disease: Secondary | ICD-10-CM | POA: Diagnosis not present

## 2015-07-23 DIAGNOSIS — E1122 Type 2 diabetes mellitus with diabetic chronic kidney disease: Secondary | ICD-10-CM | POA: Diagnosis not present

## 2015-07-23 DIAGNOSIS — N2581 Secondary hyperparathyroidism of renal origin: Secondary | ICD-10-CM | POA: Diagnosis not present

## 2015-07-23 DIAGNOSIS — D631 Anemia in chronic kidney disease: Secondary | ICD-10-CM | POA: Diagnosis not present

## 2015-07-23 DIAGNOSIS — N186 End stage renal disease: Secondary | ICD-10-CM | POA: Diagnosis not present

## 2015-07-24 ENCOUNTER — Ambulatory Visit: Payer: Medicare Other | Admitting: Physical Therapy

## 2015-07-24 ENCOUNTER — Encounter: Payer: Self-pay | Admitting: Physical Therapy

## 2015-07-24 DIAGNOSIS — R2689 Other abnormalities of gait and mobility: Secondary | ICD-10-CM

## 2015-07-24 DIAGNOSIS — R2991 Unspecified symptoms and signs involving the musculoskeletal system: Secondary | ICD-10-CM

## 2015-07-24 DIAGNOSIS — Z89511 Acquired absence of right leg below knee: Secondary | ICD-10-CM | POA: Diagnosis not present

## 2015-07-24 DIAGNOSIS — Z89512 Acquired absence of left leg below knee: Secondary | ICD-10-CM | POA: Diagnosis not present

## 2015-07-24 DIAGNOSIS — R269 Unspecified abnormalities of gait and mobility: Secondary | ICD-10-CM

## 2015-07-24 DIAGNOSIS — R5381 Other malaise: Secondary | ICD-10-CM

## 2015-07-24 DIAGNOSIS — Z7409 Other reduced mobility: Secondary | ICD-10-CM

## 2015-07-24 DIAGNOSIS — R531 Weakness: Secondary | ICD-10-CM

## 2015-07-24 DIAGNOSIS — R6889 Other general symptoms and signs: Secondary | ICD-10-CM

## 2015-07-24 DIAGNOSIS — R29818 Other symptoms and signs involving the nervous system: Secondary | ICD-10-CM | POA: Diagnosis not present

## 2015-07-24 NOTE — Therapy (Signed)
[facility] [address] [place], [place], [address] Phone: [phone]   Fax:  [phone]  Physical Therapy Treatment  Patient Details  Name: Oscar Castillo MRN: [id number] Date of Birth: July 21, 1951 Referring Provider: [name], MD  Encounter Date: 07/24/2015      PT End of Session - 07/24/15 1407    Visit Number 8   Number of Visits 26   Date for PT Re-Evaluation 09/20/15   Authorization Type Medicare Do G-Code & progress reports every 10 visits   PT Start Time 1402   PT Stop Time 1442   PT Time Calculation (min) 40 min   Equipment Utilized During Treatment Gait belt   Activity Tolerance Patient tolerated treatment well;Patient limited by pain   Behavior During Therapy [facility] for tasks assessed/performed      Past Medical History  Diagnosis Date  . ESRD on hemodialysis ([place]) 05/05/2007    ESRD due to DM/HTN. Started dialysis in November 2013.  HD TTS at [facility] on [address] [place].  [name] BACK PAIN, LUMBAR, CHRONIC 08/06/2009  . BENIGN PROSTATIC HYPERTROPHY 08/01/2010  . CEREBROVASCULAR ACCIDENT, HX OF 08/06/2009  . CHOLELITHIASIS 08/01/2010  . CONGESTIVE HEART FAILURE 03/18/2009  . DEPRESSION 03/18/2009  . DIABETES MELLITUS, TYPE II 03/25/2007  . ERECTILE DYSFUNCTION 03/25/2007  . GERD 03/25/2007  . HEPATITIS C, HX OF 03/25/2007  . HYPERTENSION 03/25/2007  . Morbid obesity ([place]) 03/25/2007  . NEPHROLITHIASIS, HX OF 03/18/2009  . Complication of anesthesia     wife states pt had trouble waking up with his last surgery in Nov., 2014  . History of Clostridium difficile   . Hemorrhoids   . Anemia   . Antral ulcer 2014    small    Past Surgical History  Procedure Laterality Date  . Nephrectomy      partial RR  . Av fistula placement  06/14/2012    Procedure: ARTERIOVENOUS (AV) FISTULA CREATION;  Surgeon: Angelia Mould, MD;  Location: [facility] OR;  Service: Vascular;  Laterality: Left;  Left basilic vein transposition  with fistula.  . Tibia im nail insertion Left 09/09/2012    Procedure: INTRAMEDULLARY (IM) NAIL TIBIAL;  Surgeon: Johnny Bridge, MD;  Location: [place];  Service: Orthopedics;  Laterality: Left;  left tibial nail and open reduction internal fixation left fibula fracture  . Orif fibula fracture Left 09/09/2012    Procedure: OPEN REDUCTION INTERNAL FIXATION (ORIF) FIBULA FRACTURE;  Surgeon: Johnny Bridge, MD;  Location: [place];  Service: Orthopedics;  Laterality: Left;  . Colonoscopy N/A 10/28/2012    Procedure: COLONOSCOPY;  Surgeon: Jeryl Columbia, MD;  Location: [facility] ENDOSCOPY;  Service: Endoscopy;  Laterality: N/A;  . Esophagogastroduodenoscopy N/A 11/02/2012    Procedure: ESOPHAGOGASTRODUODENOSCOPY (EGD);  Surgeon: Cleotis Nipper, MD;  Location: [facility] ENDOSCOPY;  Service: Endoscopy;  Laterality: N/A;  . Colonoscopy N/A 11/02/2012    Procedure: COLONOSCOPY;  Surgeon: Cleotis Nipper, MD;  Location: [facility] ENDOSCOPY;  Service: Endoscopy;  Laterality: N/A;  . Colonoscopy N/A 11/03/2012    Procedure: COLONOSCOPY;  Surgeon: Cleotis Nipper, MD;  Location: [facility] ENDOSCOPY;  Service: Endoscopy;  Laterality: N/A;  . Givens capsule study N/A 11/04/2012    Procedure: GIVENS CAPSULE STUDY;  Surgeon: Cleotis Nipper, MD;  Location: [facility] ENDOSCOPY;  Service: Endoscopy;  Laterality: N/A;  . Enteroscopy N/A 11/08/2012    Procedure: ENTEROSCOPY;  Surgeon: Wonda Horner, MD;  Location: [facility] ENDOSCOPY;  Service: Endoscopy;  Laterality: N/A;  .  Amputation Left 05/12/2013    Procedure: AMPUTATION RAY;  Surgeon: Newt Minion, MD;  Location: [place];  Service: Orthopedics;  Laterality: Left;  Left Foot 1st Ray Amputation  . Eye surgery Left     to remove scar tissue  . Amputation Left 06/09/2013    Procedure: AMPUTATION BELOW KNEE;  Surgeon: Newt Minion, MD;  Location: [place];  Service: Orthopedics;  Laterality: Left;  Left Below Knee Amputation and removal proximal screws IM tibial nail  . Hardware removal Left 06/09/2013     Procedure: HARDWARE REMOVAL;  Surgeon: Newt Minion, MD;  Location: [place];  Service: Orthopedics;  Laterality: Left;  Left Below Knee Amputation  and Removal proximal screws IM tibial nail  . Amputation Right 09/08/2013    Procedure: AMPUTATION BELOW KNEE;  Surgeon: Newt Minion, MD;  Location: [place];  Service: Orthopedics;  Laterality: Right;  Right Below Knee Amputation  . Amputation Right 10/11/2013    Procedure: AMPUTATION BELOW KNEE;  Surgeon: Newt Minion, MD;  Location: [place];  Service: Orthopedics;  Laterality: Right;  Right Below Knee Amputation Revision    There were no vitals filed for this visit.  Visit Diagnosis:  Generalized weakness  Decreased functional activity tolerance  Balance problems  Debility  Abnormality of gait  Impaired transfers  Impaired functional mobility and activity tolerance      Subjective Assessment - 07/24/15 1406    Subjective No new complaints. Still having hand pain. No falls.   Patient is accompained by: Family member  spouse   Patient Stated Goals To be able to walk more & hopefully by himself with prostheses   Pain Score 7    Pain Location Hand  bil hands   Pain Orientation Right;Left   Pain Descriptors / Indicators Cramping;Tightness;Aching;Sore;Throbbing   Pain Type Chronic pain   Pain Onset More than a month ago   Pain Frequency Intermittent   Aggravating Factors  dialysis   Pain Relieving Factors rest           [facility] Adult PT Treatment/Exercise - 07/24/15 1409    Transfers   Transfers Sit to Stand;Stand to Sit;Stand Pivot Transfers   Sit to Stand 3: Mod assist;2: Max assist   Sit to Stand Details Verbal cues for sequencing;Verbal cues for technique;Tactile cues for weight shifting;Tactile cues for posture;Tactile cues for sequencing;Manual facilitation for weight shifting   Sit to Stand Details (indicate cue type and reason) max assist of 1 person to stand to sink. mod assist of 2 people for standing from wheelchair to  walker.                            Stand to Sit 2: Max assist;With upper extremity assist;With armrests;To chair/3-in-1;Uncontrolled descent   Stand to Sit Details (indicate cue type and reason) Verbal cues for precautions/safety;Verbal cues for technique;Verbal cues for sequencing;Manual facilitation for weight shifting;Tactile cues for weight shifting;Tactile cues for posture;Tactile cues for sequencing   Stand to Sit Details max assist to sit safety each time, to control descent and cues for hand placement.   Lateral/Scoot Transfers 4: Min Art gallery manager Details (indicate cue type and reason) wheelchair<>mat table with min guard assist to min assist to clear wheel rim with scooting.                         Ambulation/Gait   Ambulation/Gait Yes  Ambulation/Gait Assistance 3: Mod assist   Ambulation/Gait Assistance Details multimodal cues on posture, sequencing and step length and step placement with both legs.   Ambulation Distance (Feet) 22 Feet  x1, 12 x1 (limited by prosthesis rotating with swing phase   Assistive device Prostheses;Rolling walker   Gait Pattern Decreased step length - right;Decreased step length - left;Decreased stride length;Right flexed knee in stance;Left flexed knee in stance;Trunk flexed;Narrow base of support   Ambulation Surface Level;Indoor   Prosthetics   Current prosthetic wear tolerance (days/week)  7 days   Current prosthetic wear tolerance (#hours/day)  90% of awake hours, including dialysis   Residual limb condition  intact per pt report     Seated edge of mat: - partial curl ups with inverted chair/pillow behind pt x 10 reps - forward lean then back up into tall sitting for lower back stretching x 10 reps - lateral elbow taps to yoga blocks x 10 reps each side        PT Short Term Goals - 07/24/15 1408    PT SHORT TERM GOAL #1   Title Patient sit to /from stand w/c to sink with moderate assist. (Target Date: 07/26/2015)    Baseline 07/24/15: max assist to achieve full standing at sink   Time --   Period --   Status Not Met   PT SHORT TERM GOAL #2   Title Patient & wife demonstrate / verbalize HEP / updated exercise program for fitness center. (Target Date: 07/26/2015)   Baseline 07/24/15: doing HEP on non dialysis days, seated ones every day and [facility] on [place] per family.    Status Achieved   PT SHORT TERM GOAL #3   Title Patient ambulates 26' with RW & prostheses with moderate assist (2 people for safety) (Target Date: 07/26/2015)   Baseline 07/24/15: pt able to ambulate short distance with mod assist of 2 people, did not reach 40 feet goal   Time --   Period --   Status Partially Met           PT Long Term Goals - 07/17/15 1320    PT LONG TERM GOAL #1   Title Patient and wife demonstrate proper prosthetic care. (Target Date: 09/20/2015)   Time 3   Period Months   Status On-going   PT LONG TERM GOAL #2   Title Pt & wife verbalize & demonstrate ongoing HEP / fitness plan.  (Target Date: 09/20/2015)   Time 3   Period Months   Status On-going   PT LONG TERM GOAL #3   Title stand pivot transfers with rolling walker & prostheses with supervision.  (Target Date: 09/20/2015)   Time 3   Period Months   Status On-going   PT LONG TERM GOAL #4   Title adjusts clothes & reaches 2" anteriorly while standing with RW support with minimal assist.  (Target Date: 09/20/2015)   Time 3   Period Months   Status On-going   PT LONG TERM GOAL #5   Title ambulate 33' with rolling walker & prostheses with minimal assist.  (Target Date: 09/20/2015)   Time 3   Period Months   Status On-going           Plan - 07/24/15 1407    Clinical Impression Statement Pt achieved 1/3 STGs today. Progressing toward others slowly. Continues to report painful UE's/hands post dialysis that are limiting his mobility/activity tolerance.   Pt will benefit from skilled therapeutic intervention in order to improve on  the  following deficits Abnormal gait;Decreased activity tolerance;Decreased balance;Decreased endurance;Decreased mobility;Decreased range of motion;Decreased strength;Postural dysfunction;Prosthetic Dependency   Rehab Potential Good   PT Frequency 2x / week   PT Duration Other (comment)  13 weeks (90 days)   PT Treatment/Interventions ADLs/Self Care Home Management;DME Instruction;Gait training;Stair training;Functional mobility training;Therapeutic activities;Therapeutic exercise;Balance training;Neuromuscular re-education;Patient/family education;Prosthetic Training   PT Next Visit Plan Work towards [place].   Consulted and Agree with Plan of Care Patient;Family member/caregiver   Family Member Consulted wife        Problem List Patient Active Problem List   Diagnosis Date Noted  . Diarrhea 08/14/2014  . Dehydration 10/02/2013  . Fever 10/02/2013  . Altered mental status 10/01/2013  . Encephalopathy, toxic 10/01/2013  . Encephalopathy, metabolic 54/62/7035  . CVA (cerebral vascular accident) ([place]) 09/28/2013  . FTT (failure to thrive) in adult 09/28/2013  . Acute confusional state 09/28/2013  . Ulcer of sacral region, stage 3 ([place]) 09/26/2013  . S/P BKA (below knee amputation) bilateral ([place]) 09/08/2013  . ESRD on hemodialysis ([place]) 05/09/2013  . TIA (transient ischemic attack) 02/20/2013  . Acute blood loss anemia 10/28/2012  . Chronic combined systolic (EF 00%) and grade 2diastolic congestive heart failure 10/28/2012  . Obstipation 10/28/2012  . GI bleed 10/27/2012  . Mass in rectum 10/27/2012  . DM (diabetes mellitus) type I controlled with renal manifestation ([place]) 09/08/2012  . LVH (left ventricular hypertrophy)-severe concentric 06/13/2012  . Anemia due to chronic illness 06/12/2012  . Hyperlipidemia 01/30/2011  . CHOLELITHIASIS 08/01/2010  . BENIGN PROSTATIC HYPERTROPHY 08/01/2010  . CEREBROVASCULAR ACCIDENT, HX OF 08/06/2009  . Depression 03/18/2009  . Acute combined  systolic and diastolic heart failure, NYHA class 2-EF 45% 03/18/2009  . NEPHROLITHIASIS, HX OF 03/18/2009  . Morbid obesity ([place]) 03/25/2007  . Essential hypertension 03/25/2007  . GERD 03/25/2007  . Chronic hepatitis C without hepatic coma ([place]) 03/25/2007    Willow Ora 07/25/2015, 1:54 PM  Willow Ora, PTA, [place] [address] [place], [place] [address] [phone] 07/25/2015, 1:54 PM   Name: Oscar Castillo MRN: [id number] Date of Birth: [birth year]

## 2015-07-26 ENCOUNTER — Encounter: Payer: Self-pay | Admitting: Physical Therapy

## 2015-07-26 ENCOUNTER — Ambulatory Visit: Payer: Medicare Other | Admitting: Physical Therapy

## 2015-07-26 ENCOUNTER — Ambulatory Visit: Payer: Self-pay | Admitting: Internal Medicine

## 2015-07-26 DIAGNOSIS — R5381 Other malaise: Secondary | ICD-10-CM

## 2015-07-26 DIAGNOSIS — Z7409 Other reduced mobility: Secondary | ICD-10-CM

## 2015-07-26 DIAGNOSIS — Z89512 Acquired absence of left leg below knee: Secondary | ICD-10-CM | POA: Diagnosis not present

## 2015-07-26 DIAGNOSIS — R531 Weakness: Secondary | ICD-10-CM | POA: Diagnosis not present

## 2015-07-26 DIAGNOSIS — R2689 Other abnormalities of gait and mobility: Secondary | ICD-10-CM

## 2015-07-26 DIAGNOSIS — R6889 Other general symptoms and signs: Secondary | ICD-10-CM | POA: Diagnosis not present

## 2015-07-26 DIAGNOSIS — R269 Unspecified abnormalities of gait and mobility: Secondary | ICD-10-CM

## 2015-07-26 DIAGNOSIS — R29818 Other symptoms and signs involving the nervous system: Secondary | ICD-10-CM | POA: Diagnosis not present

## 2015-07-26 DIAGNOSIS — R2991 Unspecified symptoms and signs involving the musculoskeletal system: Secondary | ICD-10-CM

## 2015-07-26 DIAGNOSIS — Z89511 Acquired absence of right leg below knee: Secondary | ICD-10-CM | POA: Diagnosis not present

## 2015-07-26 NOTE — Therapy (Signed)
[facility] [address] [place], [place], [address] Phone: [phone]   Fax:  [phone]  Physical Therapy Treatment  Patient Details  Name: Oscar Castillo MRN: [id number] Date of Birth: June 08, 1951 Referring Provider: [name], MD  Encounter Date: 07/26/2015      PT End of Session - 07/26/15 1321    Visit Number 9   Number of Visits 26   Date for PT Re-Evaluation 09/20/15   Authorization Type Medicare Do G-Code & progress reports every 10 visits   PT Start Time 8786   PT Stop Time 1358   PT Time Calculation (min) 41 min   Equipment Utilized During Treatment Gait belt   Activity Tolerance Patient tolerated treatment well;Patient limited by pain   Behavior During Therapy [facility] for tasks assessed/performed      Past Medical History  Diagnosis Date  . ESRD on hemodialysis ([place]) 05/05/2007    ESRD due to DM/HTN. Started dialysis in November 2013.  HD TTS at [facility] on [place].  [name] BACK PAIN, LUMBAR, CHRONIC 08/06/2009  . BENIGN PROSTATIC HYPERTROPHY 08/01/2010  . CEREBROVASCULAR ACCIDENT, HX OF 08/06/2009  . CHOLELITHIASIS 08/01/2010  . CONGESTIVE HEART FAILURE 03/18/2009  . DEPRESSION 03/18/2009  . DIABETES MELLITUS, TYPE II 03/25/2007  . ERECTILE DYSFUNCTION 03/25/2007  . GERD 03/25/2007  . HEPATITIS C, HX OF 03/25/2007  . HYPERTENSION 03/25/2007  . Morbid obesity ([place]) 03/25/2007  . NEPHROLITHIASIS, HX OF 03/18/2009  . Complication of anesthesia     wife states pt had trouble waking up with his last surgery in Nov., 2014  . History of Clostridium difficile   . Hemorrhoids   . Anemia   . Antral ulcer 2014    small    Past Surgical History  Procedure Laterality Date  . Nephrectomy      partial RR  . Av fistula placement  06/14/2012    Procedure: ARTERIOVENOUS (AV) FISTULA CREATION;  Surgeon: Angelia Mould, MD;  Location: [facility] OR;  Service: Vascular;  Laterality: Left;  Left basilic vein transposition  with fistula.  . Tibia im nail insertion Left 09/09/2012    Procedure: INTRAMEDULLARY (IM) NAIL TIBIAL;  Surgeon: Johnny Bridge, MD;  Location: [place];  Service: Orthopedics;  Laterality: Left;  left tibial nail and open reduction internal fixation left fibula fracture  . Orif fibula fracture Left 09/09/2012    Procedure: OPEN REDUCTION INTERNAL FIXATION (ORIF) FIBULA FRACTURE;  Surgeon: Johnny Bridge, MD;  Location: [place];  Service: Orthopedics;  Laterality: Left;  . Colonoscopy N/A 10/28/2012    Procedure: COLONOSCOPY;  Surgeon: Jeryl Columbia, MD;  Location: [facility] ENDOSCOPY;  Service: Endoscopy;  Laterality: N/A;  . Esophagogastroduodenoscopy N/A 11/02/2012    Procedure: ESOPHAGOGASTRODUODENOSCOPY (EGD);  Surgeon: Cleotis Nipper, MD;  Location: [facility] ENDOSCOPY;  Service: Endoscopy;  Laterality: N/A;  . Colonoscopy N/A 11/02/2012    Procedure: COLONOSCOPY;  Surgeon: Cleotis Nipper, MD;  Location: [facility] ENDOSCOPY;  Service: Endoscopy;  Laterality: N/A;  . Colonoscopy N/A 11/03/2012    Procedure: COLONOSCOPY;  Surgeon: Cleotis Nipper, MD;  Location: [facility] ENDOSCOPY;  Service: Endoscopy;  Laterality: N/A;  . Givens capsule study N/A 11/04/2012    Procedure: GIVENS CAPSULE STUDY;  Surgeon: Cleotis Nipper, MD;  Location: [facility] ENDOSCOPY;  Service: Endoscopy;  Laterality: N/A;  . Enteroscopy N/A 11/08/2012    Procedure: ENTEROSCOPY;  Surgeon: Wonda Horner, MD;  Location: [facility] ENDOSCOPY;  Service: Endoscopy;  Laterality: N/A;  .  Amputation Left 05/12/2013    Procedure: AMPUTATION RAY;  Surgeon: Newt Minion, MD;  Location: [place];  Service: Orthopedics;  Laterality: Left;  Left Foot 1st Ray Amputation  . Eye surgery Left     to remove scar tissue  . Amputation Left 06/09/2013    Procedure: AMPUTATION BELOW KNEE;  Surgeon: Newt Minion, MD;  Location: [place];  Service: Orthopedics;  Laterality: Left;  Left Below Knee Amputation and removal proximal screws IM tibial nail  . Hardware removal Left 06/09/2013     Procedure: HARDWARE REMOVAL;  Surgeon: Newt Minion, MD;  Location: [place];  Service: Orthopedics;  Laterality: Left;  Left Below Knee Amputation  and Removal proximal screws IM tibial nail  . Amputation Right 09/08/2013    Procedure: AMPUTATION BELOW KNEE;  Surgeon: Newt Minion, MD;  Location: [place];  Service: Orthopedics;  Laterality: Right;  Right Below Knee Amputation  . Amputation Right 10/11/2013    Procedure: AMPUTATION BELOW KNEE;  Surgeon: Newt Minion, MD;  Location: [place];  Service: Orthopedics;  Laterality: Right;  Right Below Knee Amputation Revision    There were no vitals filed for this visit.  Visit Diagnosis:  Generalized weakness  Decreased functional activity tolerance  Balance problems  Debility  Abnormality of gait  Impaired transfers      Subjective Assessment - 07/26/15 1320    Subjective No new complaints. Still having hand pain. No falls.   Patient is accompained by: Family member  spouse   Patient Stated Goals To be able to walk more & hopefully by himself with prostheses   Currently in Pain? Yes   Pain Score 5    Pain Location Hand   Pain Orientation Right;Left   Pain Descriptors / Indicators Aching;Sore;Throbbing;Tightness;Cramping   Pain Type Chronic pain   Pain Onset More than a month ago   Pain Frequency Intermittent   Aggravating Factors  dialysis   Pain Relieving Factors rest             [facility] Adult PT Treatment/Exercise - 07/26/15 1321    Transfers   Transfers Sit to Stand;Stand to [employer] Transfers   Sit to Stand 2: Max assist;With armrests;With upper extremity assist;From chair/3-in-1   Sit to Stand Details Verbal cues for sequencing;Verbal cues for technique;Tactile cues for weight shifting;Tactile cues for posture;Tactile cues for sequencing;Manual facilitation for weight shifting   Stand to Sit 2: Max assist;With upper extremity assist;With armrests;To chair/3-in-1;Uncontrolled descent   Stand to Sit Details (indicate  cue type and reason) Verbal cues for precautions/safety;Verbal cues for technique;Verbal cues for sequencing;Manual facilitation for weight shifting;Tactile cues for weight shifting;Tactile cues for posture;Tactile cues for sequencing   Lateral/Scoot Transfers 4: Min assist   Ambulation/Gait   Ambulation/Gait Yes   Ambulation/Gait Assistance 3: Mod assist   Ambulation Distance (Feet) 16 Feet  x1, 20 x1, 10 x1 (bil knee's buckling)   Assistive device Prostheses;Rolling walker   Gait Pattern Decreased step length - right;Decreased step length - left;Decreased stride length;Right flexed knee in stance;Left flexed knee in stance;Trunk flexed;Narrow base of support   Ambulation Surface Level;Indoor   Prosthetics   Current prosthetic wear tolerance (days/week)  7 days   Current prosthetic wear tolerance (#hours/day)  90% of awake hours, including dialysis   Residual limb condition  intact per pt report     Seated edge of mat: Forward flexion up into full trunk extension, x 15 reps Partial curl ups with inverted chair/pillow behind pt x  15 reps lateal lean: elbow to yoga block and back up, 2 sets of 10 each side UE shoulder flexion with dowel rod x 10 reps Hamstring curls with red band x 10 reps each leg hip abduction/ER with red band x 10 reps          PT Short Term Goals - 07/24/15 1408    PT SHORT TERM GOAL #1   Title Patient sit to /from stand w/c to sink with moderate assist. (Target Date: 07/26/2015)   Baseline 07/24/15: max assist to achieve full standing at sink   Time --   Period --   Status Not Met   PT SHORT TERM GOAL #2   Title Patient & wife demonstrate / verbalize HEP / updated exercise program for fitness center. (Target Date: 07/26/2015)   Baseline 07/24/15: doing HEP on non dialysis days, seated ones every day and [facility] on [place] per family.    Status Achieved   PT SHORT TERM GOAL #3   Title Patient ambulates 36' with RW & prostheses with moderate assist  (2 people for safety) (Target Date: 07/26/2015)   Baseline 07/24/15: pt able to ambulate short distance with mod assist of 2 people, did not reach 40 feet goal   Time --   Period --   Status Partially Met           PT Long Term Goals - 07/17/15 1320    PT LONG TERM GOAL #1   Title Patient and wife demonstrate proper prosthetic care. (Target Date: 09/20/2015)   Time 3   Period Months   Status On-going   PT LONG TERM GOAL #2   Title Pt & wife verbalize & demonstrate ongoing HEP / fitness plan.  (Target Date: 09/20/2015)   Time 3   Period Months   Status On-going   PT LONG TERM GOAL #3   Title stand pivot transfers with rolling walker & prostheses with supervision.  (Target Date: 09/20/2015)   Time 3   Period Months   Status On-going   PT LONG TERM GOAL #4   Title adjusts clothes & reaches 2" anteriorly while standing with RW support with minimal assist.  (Target Date: 09/20/2015)   Time 3   Period Months   Status On-going   PT LONG TERM GOAL #5   Title ambulate 64' with rolling walker & prostheses with minimal assist.  (Target Date: 09/20/2015)   Time 3   Period Months   Status On-going           Plan - 07/26/15 1321    Clinical Impression Statement Pt with increased overall gait distance today from previous session, however still did not meet STG for gait. Pt had demo'd increased activity tolerance with past 2 sessions with increased activity performed. Pt continues to report bil hand pain as his primary limitation with mobilty/activity. Pt is making progress toward LTGs.                                                  Pt will benefit from skilled therapeutic intervention in order to improve on the following deficits Abnormal gait;Decreased activity tolerance;Decreased balance;Decreased endurance;Decreased mobility;Decreased range of motion;Decreased strength;Postural dysfunction;Prosthetic Dependency   Rehab Potential Good   PT Frequency 2x / week   PT Duration Other  (comment)  13 weeks (90 days)   PT  Treatment/Interventions ADLs/Self Care Home Management;DME Instruction;Gait training;Stair training;Functional mobility training;Therapeutic activities;Therapeutic exercise;Balance training;Neuromuscular re-education;Patient/family education;Prosthetic Training   PT Next Visit Plan G-code next session. Work towards [employer].   Consulted and Agree with Plan of Care Patient;Family member/caregiver   Family Member Consulted wife        Problem List Patient Active Problem List   Diagnosis Date Noted  . Diarrhea 08/14/2014  . Dehydration 10/02/2013  . Fever 10/02/2013  . Altered mental status 10/01/2013  . Encephalopathy, toxic 10/01/2013  . Encephalopathy, metabolic 81/44/8185  . CVA (cerebral vascular accident) ([place]) 09/28/2013  . FTT (failure to thrive) in adult 09/28/2013  . Acute confusional state 09/28/2013  . Ulcer of sacral region, stage 3 ([place]) 09/26/2013  . S/P BKA (below knee amputation) bilateral ([place]) 09/08/2013  . ESRD on hemodialysis ([place]) 05/09/2013  . TIA (transient ischemic attack) 02/20/2013  . Acute blood loss anemia 10/28/2012  . Chronic combined systolic (EF 63%) and grade 2diastolic congestive heart failure 10/28/2012  . Obstipation 10/28/2012  . GI bleed 10/27/2012  . Mass in rectum 10/27/2012  . DM (diabetes mellitus) type I controlled with renal manifestation ([place]) 09/08/2012  . LVH (left ventricular hypertrophy)-severe concentric 06/13/2012  . Anemia due to chronic illness 06/12/2012  . Hyperlipidemia 01/30/2011  . CHOLELITHIASIS 08/01/2010  . BENIGN PROSTATIC HYPERTROPHY 08/01/2010  . CEREBROVASCULAR ACCIDENT, HX OF 08/06/2009  . Depression 03/18/2009  . Acute combined systolic and diastolic heart failure, NYHA class 2-EF 45% 03/18/2009  . NEPHROLITHIASIS, HX OF 03/18/2009  . Morbid obesity ([place]) 03/25/2007  . Essential hypertension 03/25/2007  . GERD 03/25/2007  . Chronic hepatitis C without hepatic coma ([place])  03/25/2007    Willow Ora 07/26/2015, 3:25 PM  Willow Ora, PTA, [facility] [address], [place], [place] [address] [phone] 07/26/2015, 3:25 PM   Name: Oscar Castillo MRN: [id number] Date of Birth: 01-Jan-1951

## 2015-07-27 DIAGNOSIS — D631 Anemia in chronic kidney disease: Secondary | ICD-10-CM | POA: Diagnosis not present

## 2015-07-27 DIAGNOSIS — E1122 Type 2 diabetes mellitus with diabetic chronic kidney disease: Secondary | ICD-10-CM | POA: Diagnosis not present

## 2015-07-27 DIAGNOSIS — N186 End stage renal disease: Secondary | ICD-10-CM | POA: Diagnosis not present

## 2015-07-27 DIAGNOSIS — Z992 Dependence on renal dialysis: Secondary | ICD-10-CM | POA: Diagnosis not present

## 2015-07-27 DIAGNOSIS — N2581 Secondary hyperparathyroidism of renal origin: Secondary | ICD-10-CM | POA: Diagnosis not present

## 2015-07-30 DIAGNOSIS — N2581 Secondary hyperparathyroidism of renal origin: Secondary | ICD-10-CM | POA: Diagnosis not present

## 2015-07-30 DIAGNOSIS — N186 End stage renal disease: Secondary | ICD-10-CM | POA: Diagnosis not present

## 2015-07-30 DIAGNOSIS — E1122 Type 2 diabetes mellitus with diabetic chronic kidney disease: Secondary | ICD-10-CM | POA: Diagnosis not present

## 2015-07-30 DIAGNOSIS — D631 Anemia in chronic kidney disease: Secondary | ICD-10-CM | POA: Diagnosis not present

## 2015-07-30 DIAGNOSIS — Z4931 Encounter for adequacy testing for hemodialysis: Secondary | ICD-10-CM | POA: Diagnosis not present

## 2015-07-31 ENCOUNTER — Encounter: Payer: Self-pay | Admitting: Physical Therapy

## 2015-07-31 ENCOUNTER — Ambulatory Visit: Payer: Medicare Other | Attending: Internal Medicine | Admitting: Physical Therapy

## 2015-07-31 DIAGNOSIS — R269 Unspecified abnormalities of gait and mobility: Secondary | ICD-10-CM

## 2015-07-31 DIAGNOSIS — M79641 Pain in right hand: Secondary | ICD-10-CM | POA: Diagnosis not present

## 2015-07-31 DIAGNOSIS — Z89512 Acquired absence of left leg below knee: Secondary | ICD-10-CM | POA: Insufficient documentation

## 2015-07-31 DIAGNOSIS — R6889 Other general symptoms and signs: Secondary | ICD-10-CM | POA: Diagnosis not present

## 2015-07-31 DIAGNOSIS — Z89511 Acquired absence of right leg below knee: Secondary | ICD-10-CM | POA: Insufficient documentation

## 2015-07-31 DIAGNOSIS — R29818 Other symptoms and signs involving the nervous system: Secondary | ICD-10-CM | POA: Diagnosis not present

## 2015-07-31 DIAGNOSIS — R531 Weakness: Secondary | ICD-10-CM | POA: Insufficient documentation

## 2015-07-31 DIAGNOSIS — Z7409 Other reduced mobility: Secondary | ICD-10-CM | POA: Diagnosis not present

## 2015-07-31 DIAGNOSIS — M24669 Ankylosis, unspecified knee: Secondary | ICD-10-CM | POA: Diagnosis not present

## 2015-07-31 DIAGNOSIS — R2991 Unspecified symptoms and signs involving the musculoskeletal system: Secondary | ICD-10-CM | POA: Diagnosis not present

## 2015-07-31 DIAGNOSIS — R2689 Other abnormalities of gait and mobility: Secondary | ICD-10-CM

## 2015-07-31 DIAGNOSIS — R5381 Other malaise: Secondary | ICD-10-CM | POA: Insufficient documentation

## 2015-07-31 NOTE — Therapy (Signed)
[facility] [address] [place], [place], [address] Phone: [phone]   Fax:  [phone]  Patient Details  Name: Oscar Castillo MRN: [id number] Date of Birth: August 24, 1950 Referring Provider:  No ref. provider found  Encounter Date: 07/31/2015   Physical Therapy Progress Note  Dates of Reporting Period: 06/26/2015 to 07/31/2015  Objective Reports of Subjective Statement: Patient reports standing at sink with wife a little at home. Patient reports compliant with HEP as able with dialysis issues.   Objective Measurements: Sit to stand w/c to stabilized RW with belt to prevent knee flexion with Max A to arise & minA to control descent. Patient ambulates up to 100' with RW & prostheses with maxA.  Goal Update:      PT Short Term Goals - 07/31/15 1344    PT SHORT TERM GOAL #1   Title Patient sit to /from stand w/c to sink with moderate assist. (NEW Target Date: 08/30/2015)   Baseline 07/24/15: max assist to achieve full standing at sink   Status On-going   PT SHORT TERM GOAL #2   Title Patient & wife demonstrate / verbalize HEP / updated exercise program for fitness center. (Target Date: 07/26/2015)   Baseline 07/24/15: doing HEP on non dialysis days, seated ones every day and [facility] on [place] per family.    Status Achieved   PT SHORT TERM GOAL #3   Title Patient ambulates 51' with RW & prostheses with moderate assist (2 people for safety) (Target Date: 07/26/2015)   Baseline 07/24/15: pt able to ambulate short distance with mod assist of 2 people, did not reach 40 feet goal   Status Partially Met   PT SHORT TERM GOAL #4   Title Patient tolerates 30 seconds of standing with RW with minA. (Target Date: 08/30/2015)   Time 1   Period Months   Status New   PT SHORT TERM GOAL #5   Title Patient ambulates 70' with RW / prostheses & turns 90* to sit in chair with armrests with moderate assist (can have 2nd person for safety).  Target Date: 08/30/2015   Time 1   Period Months   Status New     Plan: continue with plan of care established at evaluation.   Reason Skilled Services are Required: Patient requires skilled instruction and assistance to progress standing and gait to level safe to perform with his wife's assist.    Jamey Reas PT, DPT 07/31/2015, 2:16 PM  [facility] [address] [place], [place], [address] Phone: [phone]   Fax:  [phone]

## 2015-07-31 NOTE — Therapy (Signed)
[facility] [address] [place], [place], [address] Phone: [phone]   Fax:  [phone]  Physical Therapy Treatment  Patient Details  Name: Oscar Castillo MRN: [id number] Date of Birth: 1950-10-09 Referring Provider: [name], MD  Encounter Date: 07/31/2015      PT End of Session - 07/31/15 1342    Visit Number 10   Number of Visits 26   Date for PT Re-Evaluation 09/20/15   Authorization Type Medicare Do G-Code & progress reports every 10 visits   PT Start Time 1230   PT Stop Time 1316   PT Time Calculation (min) 46 min   Equipment Utilized During Treatment Gait belt   Activity Tolerance Patient tolerated treatment well;Patient limited by pain   Behavior During Therapy [facility] for tasks assessed/performed      Past Medical History  Diagnosis Date  . ESRD on hemodialysis ([place]) 05/05/2007    ESRD due to DM/HTN. Started dialysis in November 2013.  HD TTS at [facility] on [address].  [name] BACK PAIN, LUMBAR, CHRONIC 08/06/2009  . BENIGN PROSTATIC HYPERTROPHY 08/01/2010  . CEREBROVASCULAR ACCIDENT, HX OF 08/06/2009  . CHOLELITHIASIS 08/01/2010  . CONGESTIVE HEART FAILURE 03/18/2009  . DEPRESSION 03/18/2009  . DIABETES MELLITUS, TYPE II 03/25/2007  . ERECTILE DYSFUNCTION 03/25/2007  . GERD 03/25/2007  . HEPATITIS C, HX OF 03/25/2007  . HYPERTENSION 03/25/2007  . Morbid obesity ([place]) 03/25/2007  . NEPHROLITHIASIS, HX OF 03/18/2009  . Complication of anesthesia     wife states pt had trouble waking up with his last surgery in Nov., 2014  . History of Clostridium difficile   . Hemorrhoids   . Anemia   . Antral ulcer 2014    small    Past Surgical History  Procedure Laterality Date  . Nephrectomy      partial RR  . Av fistula placement  06/14/2012    Procedure: ARTERIOVENOUS (AV) FISTULA CREATION;  Surgeon: Angelia Mould, MD;  Location: [facility] OR;  Service: Vascular;  Laterality: Left;  Left basilic vein transposition  with fistula.  . Tibia im nail insertion Left 09/09/2012    Procedure: INTRAMEDULLARY (IM) NAIL TIBIAL;  Surgeon: Johnny Bridge, MD;  Location: [facility];  Service: Orthopedics;  Laterality: Left;  left tibial nail and open reduction internal fixation left fibula fracture  . Orif fibula fracture Left 09/09/2012    Procedure: OPEN REDUCTION INTERNAL FIXATION (ORIF) FIBULA FRACTURE;  Surgeon: Johnny Bridge, MD;  Location: [facility];  Service: Orthopedics;  Laterality: Left;  . Colonoscopy N/A 10/28/2012    Procedure: COLONOSCOPY;  Surgeon: Jeryl Columbia, MD;  Location: [facility] ENDOSCOPY;  Service: Endoscopy;  Laterality: N/A;  . Esophagogastroduodenoscopy N/A 11/02/2012    Procedure: ESOPHAGOGASTRODUODENOSCOPY (EGD);  Surgeon: Cleotis Nipper, MD;  Location: [facility] ENDOSCOPY;  Service: Endoscopy;  Laterality: N/A;  . Colonoscopy N/A 11/02/2012    Procedure: COLONOSCOPY;  Surgeon: Cleotis Nipper, MD;  Location: [facility] ENDOSCOPY;  Service: Endoscopy;  Laterality: N/A;  . Colonoscopy N/A 11/03/2012    Procedure: COLONOSCOPY;  Surgeon: Cleotis Nipper, MD;  Location: [facility] ENDOSCOPY;  Service: Endoscopy;  Laterality: N/A;  . Givens capsule study N/A 11/04/2012    Procedure: GIVENS CAPSULE STUDY;  Surgeon: Cleotis Nipper, MD;  Location: [facility] ENDOSCOPY;  Service: Endoscopy;  Laterality: N/A;  . Enteroscopy N/A 11/08/2012    Procedure: ENTEROSCOPY;  Surgeon: Wonda Horner, MD;  Location: [facility] ENDOSCOPY;  Service: Endoscopy;  Laterality: N/A;  .  Amputation Left 05/12/2013    Procedure: AMPUTATION RAY;  Surgeon: Newt Minion, MD;  Location: [facility];  Service: Orthopedics;  Laterality: Left;  Left Foot 1st Ray Amputation  . Eye surgery Left     to remove scar tissue  . Amputation Left 06/09/2013    Procedure: AMPUTATION BELOW KNEE;  Surgeon: Newt Minion, MD;  Location: [facility];  Service: Orthopedics;  Laterality: Left;  Left Below Knee Amputation and removal proximal screws IM tibial nail  . Hardware removal Left 06/09/2013     Procedure: HARDWARE REMOVAL;  Surgeon: Newt Minion, MD;  Location: [facility];  Service: Orthopedics;  Laterality: Left;  Left Below Knee Amputation  and Removal proximal screws IM tibial nail  . Amputation Right 09/08/2013    Procedure: AMPUTATION BELOW KNEE;  Surgeon: Newt Minion, MD;  Location: [facility];  Service: Orthopedics;  Laterality: Right;  Right Below Knee Amputation  . Amputation Right 10/11/2013    Procedure: AMPUTATION BELOW KNEE;  Surgeon: Newt Minion, MD;  Location: [facility];  Service: Orthopedics;  Laterality: Right;  Right Below Knee Amputation Revision    There were no vitals filed for this visit.  Visit Diagnosis:  Generalized weakness  Balance problems  Debility  Abnormality of gait  Impaired transfers  Impaired functional mobility and activity tolerance  Decreased range of knee movement, unspecified laterality  Status post bilateral below knee amputation (HCC)      Subjective Assessment - 07/31/15 1233    Subjective No new issues. He has been doing his trunk exercises at the side of the bed.   Currently in Pain? Yes   Pain Score 5    Pain Location Arm   Pain Orientation Right;Left   Pain Descriptors / Indicators Aching;Sore;Throbbing;Tightness;Cramping   Pain Type Chronic pain   Pain Onset More than a month ago   Pain Frequency Constant   Aggravating Factors  dialysis    Pain Relieving Factors rest     Therapeutic Exercise:  PT demo & verbal cues to instruct in prone positioning with BLE prostheses hanging over edge to stretch knees, cervical rotation in prone to increase upper back extension; knee flexion prone 10 reps alternating LEs.  Prosthetic Training with bilateral Transtibial Amputations prostheses:  Sit to stand w/c to RW stabilized against wall and belt across walker to support knees from buckling with MaxA to arise & MinA to control descent; 3 reps maintained upright up to 25 seconds with MinA.  W/c to mat scooting transfer with armrest removed  with supervision with clearance off buttocks.  Patient ambulated 10', 15', 10' with 68* turn to left to sit in chair with armrests and 8' with 34* turn to right to sit in w/c with maxA (2 people for safety) with belt across back of walker to help support / prevent knee flexion moment in stance, verbal & manual cues on posture.                               PT Short Term Goals - 07/31/15 1344    PT SHORT TERM GOAL #1   Title Patient sit to /from stand w/c to sink with moderate assist. (NEW Target Date: 08/30/2015)   Baseline 07/24/15: max assist to achieve full standing at sink   Status On-going   PT SHORT TERM GOAL #2   Title Patient & wife demonstrate / verbalize HEP / updated exercise program for fitness center. (  Target Date: 07/26/2015)   Baseline 07/24/15: doing HEP on non dialysis days, seated ones every day and [facility] [place] per family.    Status Achieved   PT SHORT TERM GOAL #3   Title Patient ambulates 50' with RW & prostheses with moderate assist (2 people for safety) (Target Date: 07/26/2015)   Baseline 07/24/15: pt able to ambulate short distance with mod assist of 2 people, did not reach 40 feet goal   Status Partially Met   PT SHORT TERM GOAL #4   Title Patient tolerates 30 seconds of standing with RW with minA. (Target Date: 08/30/2015)   Time 1   Period Months   Status New   PT SHORT TERM GOAL #5   Title Patient ambulates 71' with RW / prostheses & turns 90* to sit in chair with armrests with moderate assist (can have 2nd person for safety). Target Date: 08/30/2015   Time 1   Period Months   Status New           PT Long Term Goals - 07/17/15 1320    PT LONG TERM GOAL #1   Title Patient and wife demonstrate proper prosthetic care. (Target Date: 09/20/2015)   Time 3   Period Months   Status On-going   PT LONG TERM GOAL #2   Title Pt & wife verbalize & demonstrate ongoing HEP / fitness plan.  (Target Date: 09/20/2015)   Time 3   Period  Months   Status On-going   PT LONG TERM GOAL #3   Title stand pivot transfers with rolling walker & prostheses with supervision.  (Target Date: 09/20/2015)   Time 3   Period Months   Status On-going   PT LONG TERM GOAL #4   Title adjusts clothes & reaches 2" anteriorly while standing with RW support with minimal assist.  (Target Date: 09/20/2015)   Time 3   Period Months   Status On-going   PT LONG TERM GOAL #5   Title ambulate 27' with rolling walker & prostheses with minimal assist.  (Target Date: 09/20/2015)   Time 3   Period Months   Status On-going               Plan - 2015-08-20 1356    Clinical Impression Statement Having a belt across back legs of walker to prevent knees from buckling with standing & gait appeared to assist / support patient requiring less assistance. Patient continues to fatigue easily and he reports with chronic issues from dialysis especially pain in UEs.    Pt will benefit from skilled therapeutic intervention in order to improve on the following deficits Abnormal gait;Decreased activity tolerance;Decreased balance;Decreased endurance;Decreased mobility;Decreased range of motion;Decreased strength;Postural dysfunction;Prosthetic Dependency   Rehab Potential Good   PT Frequency 2x / week   PT Duration Other (comment)  13 weeks (90 days)   PT Treatment/Interventions ADLs/Self Care Home Management;DME Instruction;Gait training;Stair training;Functional mobility training;Therapeutic activities;Therapeutic exercise;Balance training;Neuromuscular re-education;Patient/family education;Prosthetic Training   PT Next Visit Plan Work towards updated STGs.   Consulted and Agree with Plan of Care Patient;Family member/caregiver   Family Member Consulted wife          G-Codes - 08/20/15 1359    Functional Assessment Tool Used squat pivot transfer with armrests removed with supervision. Sit to stand w/c to RW with belt to support knees upon arising with Max A and  minA to control descent. Maintains upright with RW support for 25sec with minA with RW stabliized against a wall.  Functional Limitation Changing and maintaining body position   Changing and Maintaining Body Position Current Status [phone]) At least 60 percent but less than 80 percent impaired, limited or restricted   Changing and Maintaining Body Position Goal Status ([id number]) At least 40 percent but less than 60 percent impaired, limited or restricted      Problem List Patient Active Problem List   Diagnosis Date Noted  . Diarrhea 08/14/2014  . Dehydration 10/02/2013  . Fever 10/02/2013  . Altered mental status 10/01/2013  . Encephalopathy, toxic 10/01/2013  . Encephalopathy, metabolic 97/35/3299  . CVA (cerebral vascular accident) ([place]) 09/28/2013  . FTT (failure to thrive) in adult 09/28/2013  . Acute confusional state 09/28/2013  . Ulcer of sacral region, stage 3 ([place]) 09/26/2013  . S/P BKA (below knee amputation) bilateral ([place]) 09/08/2013  . ESRD on hemodialysis ([place]) 05/09/2013  . TIA (transient ischemic attack) 02/20/2013  . Acute blood loss anemia 10/28/2012  . Chronic combined systolic (EF 24%) and grade 2diastolic congestive heart failure 10/28/2012  . Obstipation 10/28/2012  . GI bleed 10/27/2012  . Mass in rectum 10/27/2012  . DM (diabetes mellitus) type I controlled with renal manifestation ([place]) 09/08/2012  . LVH (left ventricular hypertrophy)-severe concentric 06/13/2012  . Anemia due to chronic illness 06/12/2012  . Hyperlipidemia 01/30/2011  . CHOLELITHIASIS 08/01/2010  . BENIGN PROSTATIC HYPERTROPHY 08/01/2010  . CEREBROVASCULAR ACCIDENT, HX OF 08/06/2009  . Depression 03/18/2009  . Acute combined systolic and diastolic heart failure, NYHA class 2-EF 45% 03/18/2009  . NEPHROLITHIASIS, HX OF 03/18/2009  . Morbid obesity ([place]) 03/25/2007  . Essential hypertension 03/25/2007  . GERD 03/25/2007  . Chronic hepatitis C without hepatic coma ([place]) 03/25/2007     Konstantinos Cordoba PT, DPT 07/31/2015, 2:12 PM  [facility] [address] [place], [place], [address] Phone: [phone]   Fax:  [phone]  Name: Oscar Castillo MRN: [id number] Date of Birth: 1950-12-18

## 2015-08-01 DIAGNOSIS — N2581 Secondary hyperparathyroidism of renal origin: Secondary | ICD-10-CM | POA: Diagnosis not present

## 2015-08-01 DIAGNOSIS — N186 End stage renal disease: Secondary | ICD-10-CM | POA: Diagnosis not present

## 2015-08-01 DIAGNOSIS — Z4931 Encounter for adequacy testing for hemodialysis: Secondary | ICD-10-CM | POA: Diagnosis not present

## 2015-08-01 DIAGNOSIS — E1122 Type 2 diabetes mellitus with diabetic chronic kidney disease: Secondary | ICD-10-CM | POA: Diagnosis not present

## 2015-08-01 DIAGNOSIS — D631 Anemia in chronic kidney disease: Secondary | ICD-10-CM | POA: Diagnosis not present

## 2015-08-02 ENCOUNTER — Ambulatory Visit: Payer: Medicare Other | Admitting: Physical Therapy

## 2015-08-02 ENCOUNTER — Encounter: Payer: Self-pay | Admitting: Physical Therapy

## 2015-08-02 DIAGNOSIS — R531 Weakness: Secondary | ICD-10-CM | POA: Diagnosis not present

## 2015-08-02 DIAGNOSIS — R29818 Other symptoms and signs involving the nervous system: Secondary | ICD-10-CM | POA: Diagnosis not present

## 2015-08-02 DIAGNOSIS — Z7409 Other reduced mobility: Secondary | ICD-10-CM | POA: Diagnosis not present

## 2015-08-02 DIAGNOSIS — R2689 Other abnormalities of gait and mobility: Secondary | ICD-10-CM

## 2015-08-02 DIAGNOSIS — R6889 Other general symptoms and signs: Secondary | ICD-10-CM

## 2015-08-02 DIAGNOSIS — R5381 Other malaise: Secondary | ICD-10-CM | POA: Diagnosis not present

## 2015-08-02 DIAGNOSIS — R2991 Unspecified symptoms and signs involving the musculoskeletal system: Secondary | ICD-10-CM

## 2015-08-02 DIAGNOSIS — R269 Unspecified abnormalities of gait and mobility: Secondary | ICD-10-CM

## 2015-08-03 NOTE — Therapy (Signed)
[facility] [address] [place], [place], [address] Phone: [phone]   Fax:  [phone]  Physical Therapy Treatment  Patient Details  Name: Oscar Castillo MRN: [id number] Date of Birth: [birth year] Referring Provider: [name], MD  Encounter Date: 08/02/2015      PT End of Session - 08/02/15 1237    Visit Number 11   Number of Visits 26   Date for PT Re-Evaluation 09/20/15   Authorization Type Medicare Do G-Code & progress reports every 10 visits   PT Start Time 1233   PT Stop Time 1315   PT Time Calculation (min) 42 min   Equipment Utilized During Treatment Gait belt   Activity Tolerance Patient tolerated treatment well;Patient limited by pain   Behavior During Therapy [facility] for tasks assessed/performed      Past Medical History  Diagnosis Date  . ESRD on hemodialysis ([employer]) 05/05/2007    ESRD due to DM/HTN. Started dialysis in November 2013.  HD TTS at [facility] on [address].  [name] BACK PAIN, LUMBAR, CHRONIC 08/06/2009  . BENIGN PROSTATIC HYPERTROPHY 08/01/2010  . CEREBROVASCULAR ACCIDENT, HX OF 08/06/2009  . CHOLELITHIASIS 08/01/2010  . CONGESTIVE HEART FAILURE 03/18/2009  . DEPRESSION 03/18/2009  . DIABETES MELLITUS, TYPE II 03/25/2007  . ERECTILE DYSFUNCTION 03/25/2007  . GERD 03/25/2007  . HEPATITIS C, HX OF 03/25/2007  . HYPERTENSION 03/25/2007  . Morbid obesity ([place]) 03/25/2007  . NEPHROLITHIASIS, HX OF 03/18/2009  . Complication of anesthesia     wife states pt had trouble waking up with his last surgery in Nov., 2014  . History of Clostridium difficile   . Hemorrhoids   . Anemia   . Antral ulcer 2014    small    Past Surgical History  Procedure Laterality Date  . Nephrectomy      partial RR  . Av fistula placement  06/14/2012    Procedure: ARTERIOVENOUS (AV) FISTULA CREATION;  Surgeon: Angelia Mould, MD;  [facility];  Service: Vascular;  Laterality: Left;  Left basilic vein transposition  with fistula.  . Tibia im nail insertion Left 09/09/2012    Procedure: INTRAMEDULLARY (IM) NAIL TIBIAL;  Surgeon: Johnny Bridge, MD;  Location: [facility];  Service: Orthopedics;  Laterality: Left;  left tibial nail and open reduction internal fixation left fibula fracture  . Orif fibula fracture Left 09/09/2012    Procedure: OPEN REDUCTION INTERNAL FIXATION (ORIF) FIBULA FRACTURE;  Surgeon: Johnny Bridge, MD;  Location: [place];  Service: Orthopedics;  Laterality: Left;  . Colonoscopy N/A 10/28/2012    Procedure: COLONOSCOPY;  Surgeon: Jeryl Columbia, MD;  Location: [facility] ENDOSCOPY;  Service: Endoscopy;  Laterality: N/A;  . Esophagogastroduodenoscopy N/A 11/02/2012    Procedure: ESOPHAGOGASTRODUODENOSCOPY (EGD);  Surgeon: Cleotis Nipper, MD;  Location: [facility] ENDOSCOPY;  Service: Endoscopy;  Laterality: N/A;  . Colonoscopy N/A 11/02/2012    Procedure: COLONOSCOPY;  Surgeon: Cleotis Nipper, MD;  Location: [facility] ENDOSCOPY;  Service: Endoscopy;  Laterality: N/A;  . Colonoscopy N/A 11/03/2012    Procedure: COLONOSCOPY;  Surgeon: Cleotis Nipper, MD;  Location: [facility] ENDOSCOPY;  Service: Endoscopy;  Laterality: N/A;  . Givens capsule study N/A 11/04/2012    Procedure: GIVENS CAPSULE STUDY;  Surgeon: Cleotis Nipper, MD;  Location: [facility] ENDOSCOPY;  Service: Endoscopy;  Laterality: N/A;  . Enteroscopy N/A 11/08/2012    Procedure: ENTEROSCOPY;  Surgeon: Wonda Horner, MD;  Location: [facility] ENDOSCOPY;  Service: Endoscopy;  Laterality: N/A;  .  Amputation Left 05/12/2013    Procedure: AMPUTATION RAY;  Surgeon: Newt Minion, MD;  Location: [place];  Service: Orthopedics;  Laterality: Left;  Left Foot 1st Ray Amputation  . Eye surgery Left     to remove scar tissue  . Amputation Left 06/09/2013    Procedure: AMPUTATION BELOW KNEE;  Surgeon: Newt Minion, MD;  Location: [place] [facility];  Service: Orthopedics;  Laterality: Left;  Left Below Knee Amputation and removal proximal screws IM tibial nail  . Hardware removal Left 06/09/2013     Procedure: HARDWARE REMOVAL;  Surgeon: Newt Minion, MD;  Location: [place];  Service: Orthopedics;  Laterality: Left;  Left Below Knee Amputation  and Removal proximal screws IM tibial nail  . Amputation Right 09/08/2013    Procedure: AMPUTATION BELOW KNEE;  Surgeon: Newt Minion, MD;  Location: [place];  Service: Orthopedics;  Laterality: Right;  Right Below Knee Amputation  . Amputation Right 10/11/2013    Procedure: AMPUTATION BELOW KNEE;  Surgeon: Newt Minion, MD;  Location: [place];  Service: Orthopedics;  Laterality: Right;  Right Below Knee Amputation Revision    There were no vitals filed for this visit.  Visit Diagnosis:  Generalized weakness  Balance problems  Debility  Abnormality of gait  Impaired transfers  Impaired functional mobility and activity tolerance  Decreased functional activity tolerance      Subjective Assessment - 08/02/15 1236    Subjective No new complaints. No falls. Usual hand pain.   Patient is accompained by: Family member  spouse   Patient Stated Goals To be able to walk more & hopefully by himself with prostheses   Currently in Pain? Yes   Pain Score 4    Pain Location Arm   Pain Orientation Right;Left   Pain Descriptors / Indicators Aching;Sore;Tightness;Cramping   Pain Type Chronic pain   Pain Onset More than a month ago   Pain Frequency Constant   Aggravating Factors  dialysis   Pain Relieving Factors rest            [facility] Adult PT Treatment/Exercise - 08/02/15 1237    Transfers   Sit to Stand 3: Mod assist;With upper extremity assist;With armrests;From chair/3-in-1  of 2 people   Sit to Stand Details Verbal cues for sequencing;Verbal cues for technique;Tactile cues for weight shifting;Tactile cues for posture;Tactile cues for sequencing;Manual facilitation for weight shifting   Stand to Sit 2: Max assist;With upper extremity assist;With armrests;To chair/3-in-1;Uncontrolled descent   Stand to Sit Details (indicate cue type and  reason) Verbal cues for precautions/safety;Verbal cues for technique;Verbal cues for sequencing;Manual facilitation for weight shifting;Tactile cues for weight shifting;Tactile cues for posture;Tactile cues for sequencing   Ambulation/Gait   Ambulation/Gait Yes   Ambulation/Gait Assistance 3: Mod assist  of 2 people   Ambulation/Gait Assistance Details utilized belt on walker for first 2 gait tirals, no belt for last 2 gait trials.    Ambulation Distance (Feet) 24 Feet  x1; 18 x1, 30 x1, 15 x1   Assistive device Prostheses;Rolling walker   Gait Pattern Decreased step length - right;Decreased step length - left;Decreased stride length;Right flexed knee in stance;Left flexed knee in stance;Trunk flexed;Narrow base of support   Ambulation Surface Level;Indoor   Prosthetics   Prosthetic Care Comments  Richardson Landry with [facility] here to make any adjustments needed to bil prostheses. Adjusted right one to decrease right knee valgus and adjusted the foot position as well. No changes made to left one. Was also recommended tha  that the pt makes an appt to have increased pad sizes for sockets.                            Current prosthetic wear tolerance (days/week)  7 days   Current prosthetic wear tolerance (#hours/day)  90% of awake hours, including dialysis   Residual limb condition  intact per pt report           PT Short Term Goals - 07/31/15 1344    PT SHORT TERM GOAL #1   Title Patient sit to /from stand w/c to sink with moderate assist. (NEW [place] Date: 08/30/2015)   Baseline 07/24/15: max assist to achieve full standing at sink   Status On-going   PT SHORT TERM GOAL #2   Title Patient & wife demonstrate / verbalize HEP / updated exercise program for fitness center. (Target Date: 07/26/2015)   Baseline 07/24/15: doing HEP on non dialysis days, seated ones every day and [facility] on [place] per family.    Status Achieved   PT SHORT TERM GOAL #3   Title Patient ambulates 48' with RW &  prostheses with moderate assist (2 people for safety) (Target Date: 07/26/2015)   Baseline 07/24/15: pt able to ambulate short distance with mod assist of 2 people, did not reach 40 feet goal   Status Partially Met   PT SHORT TERM GOAL #4   Title Patient tolerates 30 seconds of standing with RW with minA. (Target Date: 08/30/2015)   Time 1   Period Months   Status New   PT SHORT TERM GOAL #5   Title Patient ambulates 62' with RW / prostheses & turns 90* to sit in chair with armrests with moderate assist (can have 2nd person for safety). Target Date: 08/30/2015   Time 1   Period Months   Status New           PT Long Term Goals - 07/17/15 1320    PT LONG TERM GOAL #1   Title Patient and wife demonstrate proper prosthetic care. (Target Date: 09/20/2015)   Time 3   Period Months   Status On-going   PT LONG TERM GOAL #2   Title Pt & wife verbalize & demonstrate ongoing HEP / fitness plan.  (Target Date: 09/20/2015)   Time 3   Period Months   Status On-going   PT LONG TERM GOAL #3   Title stand pivot transfers with rolling walker & prostheses with supervision.  (Target Date: 09/20/2015)   Time 3   Period Months   Status On-going   PT LONG TERM GOAL #4   Title adjusts clothes & reaches 2" anteriorly while standing with RW support with minimal assist.  (Target Date: 09/20/2015)   Time 3   Period Months   Status On-going   PT LONG TERM GOAL #5   Title ambulate 70' with rolling walker & prostheses with minimal assist.  (Target Date: 09/20/2015)   Time 3   Period Months   Status On-going            Plan - 08/02/15 1237    Clinical Impression Statement Skilled session today focued on gait with walker, Use of belt across back of walker did help with preventing knee buckling/promoting knee extension, however it limited pt's step length. Pt making steady progress towards goals.   Pt will benefit from skilled therapeutic intervention in order to improve on the following deficits Abnormal  gait;Decreased activity  tolerance;Decreased balance;Decreased endurance;Decreased mobility;Decreased range of motion;Decreased strength;Postural dysfunction;Prosthetic Dependency   Rehab Potential Good   PT Frequency 2x / week   PT Duration Other (comment)  13 weeks (90 days)   PT Treatment/Interventions ADLs/Self Care Home Management;DME Instruction;Gait training;Stair training;Functional mobility training;Therapeutic activities;Therapeutic exercise;Balance training;Neuromuscular re-education;Patient/family education;Prosthetic Training   PT Next Visit Plan Work towards updated STGs.   Consulted and Agree with Plan of Care Patient;Family member/caregiver   Family Member Consulted wife        Problem List Patient Active Problem List   Diagnosis Date Noted  . Diarrhea 08/14/2014  . Dehydration 10/02/2013  . Fever 10/02/2013  . Altered mental status 10/01/2013  . Encephalopathy, toxic 10/01/2013  . Encephalopathy, metabolic 35/57/3220  . CVA (cerebral vascular accident) ([place]) 09/28/2013  . FTT (failure to thrive) in adult 09/28/2013  . Acute confusional state 09/28/2013  . Ulcer of sacral region, stage 3 ([place]) 09/26/2013  . S/P BKA (below knee amputation) bilateral ([place]) 09/08/2013  . ESRD on hemodialysis ([place]) 05/09/2013  . TIA (transient ischemic attack) 02/20/2013  . Acute blood loss anemia 10/28/2012  . Chronic combined systolic (EF 25%) and grade 2diastolic congestive heart failure 10/28/2012  . Obstipation 10/28/2012  . GI bleed 10/27/2012  . Mass in rectum 10/27/2012  . DM (diabetes mellitus) type I controlled with renal manifestation ([place]) 09/08/2012  . LVH (left ventricular hypertrophy)-severe concentric 06/13/2012  . Anemia due to chronic illness 06/12/2012  . Hyperlipidemia 01/30/2011  . CHOLELITHIASIS 08/01/2010  . BENIGN PROSTATIC HYPERTROPHY 08/01/2010  . CEREBROVASCULAR ACCIDENT, HX OF 08/06/2009  . Depression 03/18/2009  . Acute combined systolic and diastolic  heart failure, NYHA class 2-EF 45% 03/18/2009  . NEPHROLITHIASIS, HX OF 03/18/2009  . Morbid obesity ([place]) 03/25/2007  . Essential hypertension 03/25/2007  . GERD 03/25/2007  . Chronic hepatitis C without hepatic coma ([place]) 03/25/2007    Willow Ora 08/03/2015, 7:27 PM  Willow Ora, PTA, [facility] [address], [place], [place] [address] [phone] 08/03/2015, 7:28 PM  Name: Oscar Castillo MRN: [id number] Date of Birth: 1951-03-15

## 2015-08-06 DIAGNOSIS — N186 End stage renal disease: Secondary | ICD-10-CM | POA: Diagnosis not present

## 2015-08-06 DIAGNOSIS — D631 Anemia in chronic kidney disease: Secondary | ICD-10-CM | POA: Diagnosis not present

## 2015-08-06 DIAGNOSIS — E1122 Type 2 diabetes mellitus with diabetic chronic kidney disease: Secondary | ICD-10-CM | POA: Diagnosis not present

## 2015-08-06 DIAGNOSIS — Z4931 Encounter for adequacy testing for hemodialysis: Secondary | ICD-10-CM | POA: Diagnosis not present

## 2015-08-06 DIAGNOSIS — N2581 Secondary hyperparathyroidism of renal origin: Secondary | ICD-10-CM | POA: Diagnosis not present

## 2015-08-07 ENCOUNTER — Ambulatory Visit: Payer: Medicare Other | Admitting: Physical Therapy

## 2015-08-07 ENCOUNTER — Encounter: Payer: Self-pay | Admitting: Physical Therapy

## 2015-08-07 DIAGNOSIS — R531 Weakness: Secondary | ICD-10-CM | POA: Diagnosis not present

## 2015-08-07 DIAGNOSIS — R29818 Other symptoms and signs involving the nervous system: Secondary | ICD-10-CM | POA: Diagnosis not present

## 2015-08-07 DIAGNOSIS — R269 Unspecified abnormalities of gait and mobility: Secondary | ICD-10-CM | POA: Diagnosis not present

## 2015-08-07 DIAGNOSIS — Z7409 Other reduced mobility: Secondary | ICD-10-CM

## 2015-08-07 DIAGNOSIS — R5381 Other malaise: Secondary | ICD-10-CM | POA: Diagnosis not present

## 2015-08-07 DIAGNOSIS — Z89511 Acquired absence of right leg below knee: Secondary | ICD-10-CM

## 2015-08-07 DIAGNOSIS — R2991 Unspecified symptoms and signs involving the musculoskeletal system: Secondary | ICD-10-CM | POA: Diagnosis not present

## 2015-08-07 DIAGNOSIS — Z89512 Acquired absence of left leg below knee: Secondary | ICD-10-CM

## 2015-08-07 DIAGNOSIS — R2689 Other abnormalities of gait and mobility: Secondary | ICD-10-CM

## 2015-08-07 DIAGNOSIS — M24669 Ankylosis, unspecified knee: Secondary | ICD-10-CM

## 2015-08-07 NOTE — Therapy (Signed)
[facility] [address] [place], [place], [address] Phone: [phone]   Fax:  [phone]  Physical Therapy Treatment  Patient Details  Name: Oscar Castillo MRN: [id number] Date of Birth: [birth year] Referring Provider: [name], MD  Encounter Date: 08/07/2015      PT End of Session - 08/07/15 1329    Visit Number 12   Number of Visits 26   Date for PT Re-Evaluation 09/20/15   Authorization Type Medicare Do G-Code & progress reports every 10 visits   PT Start Time 1237   PT Stop Time 1315   PT Time Calculation (min) 38 min   Equipment Utilized During Treatment Gait belt   Activity Tolerance Patient tolerated treatment well;Patient limited by pain   Behavior During Therapy [facility] for tasks assessed/performed      Past Medical History  Diagnosis Date  . ESRD on hemodialysis ([place]) 05/05/2007    ESRD due to DM/HTN. Started dialysis in November 2013.  HD TTS at [facility] [address].  [name] BACK PAIN, LUMBAR, CHRONIC 08/06/2009  . BENIGN PROSTATIC HYPERTROPHY 08/01/2010  . CEREBROVASCULAR ACCIDENT, HX OF 08/06/2009  . CHOLELITHIASIS 08/01/2010  . CONGESTIVE HEART FAILURE 03/18/2009  . DEPRESSION 03/18/2009  . DIABETES MELLITUS, TYPE II 03/25/2007  . ERECTILE DYSFUNCTION 03/25/2007  . GERD 03/25/2007  . HEPATITIS C, HX OF 03/25/2007  . HYPERTENSION 03/25/2007  . Morbid obesity ([place]) 03/25/2007  . NEPHROLITHIASIS, HX OF 03/18/2009  . Complication of anesthesia     wife states pt had trouble waking up with his last surgery in Nov., 2014  . History of Clostridium difficile   . Hemorrhoids   . Anemia   . Antral ulcer 2014    small    Past Surgical History  Procedure Laterality Date  . Nephrectomy      partial RR  . Av fistula placement  06/14/2012    Procedure: ARTERIOVENOUS (AV) FISTULA CREATION;  Surgeon: Angelia Mould, MD;  Location: [facility] OR;  Service: Vascular;  Laterality: Left;  Left basilic vein transposition  with fistula.  . Tibia im nail insertion Left 09/09/2012    Procedure: INTRAMEDULLARY (IM) NAIL TIBIAL;  Surgeon: Johnny Bridge, MD;  Location: [place];  Service: Orthopedics;  Laterality: Left;  left tibial nail and open reduction internal fixation left fibula fracture  . Orif fibula fracture Left 09/09/2012    Procedure: OPEN REDUCTION INTERNAL FIXATION (ORIF) FIBULA FRACTURE;  Surgeon: Johnny Bridge, MD;  Location: [place];  Service: Orthopedics;  Laterality: Left;  . Colonoscopy N/A 10/28/2012    Procedure: COLONOSCOPY;  Surgeon: Jeryl Columbia, MD;  Location: [facility] ENDOSCOPY;  Service: Endoscopy;  Laterality: N/A;  . Esophagogastroduodenoscopy N/A 11/02/2012    Procedure: ESOPHAGOGASTRODUODENOSCOPY (EGD);  Surgeon: Cleotis Nipper, MD;  Location: [facility] ENDOSCOPY;  Service: Endoscopy;  Laterality: N/A;  . Colonoscopy N/A 11/02/2012    Procedure: COLONOSCOPY;  Surgeon: Cleotis Nipper, MD;  Location[facility] ENDOSCOPY;  Service: Endoscopy;  Laterality: N/A;  . Colonoscopy N/A 11/03/2012    Procedure: COLONOSCOPY;  Surgeon: Cleotis Nipper, MD;  Location: [facility] ENDOSCOPY;  Service: Endoscopy;  Laterality: N/A;  . Givens capsule study N/A 11/04/2012    Procedure: GIVENS CAPSULE STUDY;  Surgeon: Cleotis Nipper, MD;  Location: [facility] ENDOSCOPY;  Service: Endoscopy;  Laterality: N/A;  . Enteroscopy N/A 11/08/2012    Procedure: ENTEROSCOPY;  Surgeon: Wonda Horner, MD;  Location: [facility] ENDOSCOPY;  Service: Endoscopy;  Laterality: N/A;  .  Amputation Left 05/12/2013    Procedure: AMPUTATION RAY;  Surgeon: Newt Minion, MD;  Location: [place];  Service: Orthopedics;  Laterality: Left;  Left Foot 1st Ray Amputation  . Eye surgery Left     to remove scar tissue  . Amputation Left 06/09/2013    Procedure: AMPUTATION BELOW KNEE;  Surgeon: Newt Minion, MD;  Location: [place];  Service: Orthopedics;  Laterality: Left;  Left Below Knee Amputation and removal proximal screws IM tibial nail  . Hardware removal Left 06/09/2013     Procedure: HARDWARE REMOVAL;  Surgeon: Newt Minion, MD;  Location: [place];  Service: Orthopedics;  Laterality: Left;  Left Below Knee Amputation  and Removal proximal screws IM tibial nail  . Amputation Right 09/08/2013    Procedure: AMPUTATION BELOW KNEE;  Surgeon: Newt Minion, MD;  Location: [place];  Service: Orthopedics;  Laterality: Right;  Right Below Knee Amputation  . Amputation Right 10/11/2013    Procedure: AMPUTATION BELOW KNEE;  Surgeon: Newt Minion, MD;  Location: [place];  Service: Orthopedics;  Laterality: Right;  Right Below Knee Amputation Revision    There were no vitals filed for this visit.  Visit Diagnosis:  Generalized weakness  Balance problems  Debility  Abnormality of gait  Impaired transfers  Impaired functional mobility and activity tolerance  Decreased range of knee movement, unspecified laterality  Status post bilateral below knee amputation [facility])      Subjective Assessment - 08/07/15 1237    Subjective He did not go to dialysis Saturday due to snow. So 5 days later when he went to dialysis yesterday, they had to "pull hard" which has drained him.    Currently in Pain? Yes   Pain Score 8    Pain Location Arm   Pain Orientation Right;Left   Pain Descriptors / Indicators Aching;Sore;Tightness;Cramping   Pain Type Chronic pain   Pain Onset More than a month ago   Pain Frequency Constant   Aggravating Factors  dialysis   Pain Relieving Factors esrest              Therapeutic Exercise: Bil. Leg press 60# 10 reps, single leg press 30# 5 reps for each rt & lt LEs with verbal & tactile cues.  Passive hamstring stretch to bilateral LEs 30 sec hold 1 rep each.            [place] Adult PT Treatment/Exercise - 08/07/15 1237    Transfers   Sit to Stand 3: Mod assist;With upper extremity assist;With armrests;From chair/3-in-1  1 person   Sit to Stand Details Verbal cues for sequencing;Verbal cues for technique;Tactile cues for weight  shifting;Tactile cues for posture;Tactile cues for sequencing;Manual facilitation for weight shifting   Sit to Stand Details (indicate cue type and reason) constant cues on technique   Stand to Sit 3: Mod assist;With armrests;To chair/3-in-1;Uncontrolled descent   Stand to Sit Details (indicate cue type and reason) Verbal cues for precautions/safety;Verbal cues for technique;Verbal cues for sequencing;Manual facilitation for weight shifting;Tactile cues for weight shifting;Tactile cues for posture;Tactile cues for sequencing   Stand to Sit Details verbal cues on technique   Squat Pivot Transfers 4: Min assist;With upper extremity assistance  w/c to leg press (2" higher)    Squat Pivot Transfer Details (indicate cue type and reason) verbal cues on hand placement and lifting buttocks   Ambulation/Gait   Ambulation/Gait Yes   Ambulation/Gait Assistance 3: Mod assist  of 2 people (2nd person SBA straight path &  modA turns)   Ambulation/Gait Assistance Details manual assist to maintain upright, verbal cues on sequence (RW, LLE, RW, RLE...) & posture. Manual & verbal cues on maintaining upright thru turning to position to sit. All chairs with armrests were positioned 90* turn only.    Ambulation Distance (Feet) 14 Feet  14', 12' & 14' plus 90* turn to position to sit   Assistive device Prostheses;Rolling walker   Gait Pattern Decreased step length - right;Decreased step length - left;Decreased stride length;Right flexed knee in stance;Left flexed knee in stance;Trunk flexed;Narrow base of support   Ambulation Surface Indoor;Level   Prosthetics   Prosthetic Care Comments  Pt arrived with both liners rotated 90* to proper position. PT demo, instructed pt & wife in proper position with complete inversion to minimize play between liners & limbs   Current prosthetic wear tolerance (days/week)  7 days   Current prosthetic wear tolerance (#hours/day)  90% of awake hours, including dialysis   Residual limb  condition  intact    Education Provided Proper [place]   Person(s) Educated Patient;Spouse   Education Method Explanation;Demonstration;Verbal cues   Education Method Verbalized understanding;Needs further instruction                  PT Short Term Goals - 07/31/15 1344    PT SHORT TERM GOAL #1   Title Patient sit to /from stand w/c to sink with moderate assist. (NEW Target Date: 08/30/2015)   Baseline 07/24/15: max assist to achieve full standing at sink   Status On-going   PT SHORT TERM GOAL #2   Title Patient & wife demonstrate / verbalize HEP / updated exercise program for fitness center. (Target Date: 07/26/2015)   Baseline 07/24/15: doing HEP on non dialysis days, seated ones every day and [facility] on [place] per family.    Status Achieved   PT SHORT TERM GOAL #3   Title Patient ambulates 96' with RW & prostheses with moderate assist (2 people for safety) (Target Date: 07/26/2015)   Baseline 07/24/15: pt able to ambulate short distance with mod assist of 2 people, did not reach 40 feet goal   Status Partially Met   PT SHORT TERM GOAL #4   Title Patient tolerates 30 seconds of standing with RW with minA. (Target Date: 08/30/2015)   Time 1   Period Months   Status New   PT SHORT TERM GOAL #5   Title Patient ambulates 17' with RW / prostheses & turns 90* to sit in chair with armrests with moderate assist (can have 2nd person for safety). Target Date: 08/30/2015   Time 1   Period Months   Status New           PT Long Term Goals - 07/17/15 1320    PT LONG TERM GOAL #1   Title Patient and wife demonstrate proper prosthetic care. (Target Date: 09/20/2015)   Time 3   Period Months   Status On-going   PT LONG TERM GOAL #2   Title Pt & wife verbalize & demonstrate ongoing HEP / fitness plan.  (Target Date: 09/20/2015)   Time 3   Period Months   Status On-going   PT LONG TERM GOAL #3   Title stand pivot transfers with rolling walker & prostheses with supervision.   (Target Date: 09/20/2015)   Time 3   Period Months   Status On-going   PT LONG TERM GOAL #4   Title adjusts clothes & reaches 2" anteriorly while standing with RW  support with minimal assist.  (Target Date: 09/20/2015)   Time 3   Period Months   Status On-going   PT LONG TERM GOAL #5   Title ambulate 51' with rolling walker & prostheses with minimal assist.  (Target Date: 09/20/2015)   Time 3   Period Months   Status On-going               Plan - 08/07/15 1330    Clinical Impression Statement Patient was more fatigued than normal due to dialysis "pulled harder" due to missing Saturday with snow. Patient needs less assist in straight pathes but starts to sit prior to turning to position in front of chair which increases assistance to prevent a fall.    Pt will benefit from skilled therapeutic intervention in order to improve on the following deficits Abnormal gait;Decreased activity tolerance;Decreased balance;Decreased endurance;Decreased mobility;Decreased range of motion;Decreased strength;Postural dysfunction;Prosthetic Dependency   Rehab Potential Good   PT Frequency 2x / week   PT Duration Other (comment)  13 weeks (90 days)   PT Treatment/Interventions ADLs/Self Care Home Management;DME Instruction;Gait training;Stair training;Functional mobility training;Therapeutic activities;Therapeutic exercise;Balance training;Neuromuscular re-education;Patient/family education;Prosthetic Training   PT Next Visit Plan Work towards updated STGs.   Consulted and Agree with Plan of Care Patient;Family member/caregiver   Family Member Consulted wife        Problem List Patient Active Problem List   Diagnosis Date Noted  . Diarrhea 08/14/2014  . Dehydration 10/02/2013  . Fever 10/02/2013  . Altered mental status 10/01/2013  . Encephalopathy, toxic 10/01/2013  . Encephalopathy, metabolic 60/04/9322  . CVA (cerebral vascular accident) ([place]) 09/28/2013  . FTT (failure to thrive) in  adult 09/28/2013  . Acute confusional state 09/28/2013  . Ulcer of sacral region, stage 3 ([place]) 09/26/2013  . S/P BKA (below knee amputation) bilateral ([place]) 09/08/2013  . ESRD on hemodialysis ([place]) 05/09/2013  . TIA (transient ischemic attack) 02/20/2013  . Acute blood loss anemia 10/28/2012  . Chronic combined systolic (EF 55%) and grade 2diastolic congestive heart failure 10/28/2012  . Obstipation 10/28/2012  . GI bleed 10/27/2012  . Mass in rectum 10/27/2012  . DM (diabetes mellitus) type I controlled with renal manifestation ([place]) 09/08/2012  . LVH (left ventricular hypertrophy)-severe concentric 06/13/2012  . Anemia due to chronic illness 06/12/2012  . Hyperlipidemia 01/30/2011  . CHOLELITHIASIS 08/01/2010  . BENIGN PROSTATIC HYPERTROPHY 08/01/2010  . CEREBROVASCULAR ACCIDENT, HX OF 08/06/2009  . Depression 03/18/2009  . Acute combined systolic and diastolic heart failure, NYHA class 2-EF 45% 03/18/2009  . NEPHROLITHIASIS, HX OF 03/18/2009  . Morbid obesity ([place]) 03/25/2007  . Essential hypertension 03/25/2007  . GERD 03/25/2007  . Chronic hepatitis C without hepatic coma ([place]) 03/25/2007    Shaquinta Peruski PT, DPT 08/07/2015, 1:51 PM  [facility] [address] [place], [place], [address] Phone: [phone]   Fax:  [phone]  Name: Oscar Castillo MRN: [id number] Date of Birth: 1950/08/01

## 2015-08-08 DIAGNOSIS — Z4931 Encounter for adequacy testing for hemodialysis: Secondary | ICD-10-CM | POA: Diagnosis not present

## 2015-08-08 DIAGNOSIS — E1122 Type 2 diabetes mellitus with diabetic chronic kidney disease: Secondary | ICD-10-CM | POA: Diagnosis not present

## 2015-08-08 DIAGNOSIS — N2581 Secondary hyperparathyroidism of renal origin: Secondary | ICD-10-CM | POA: Diagnosis not present

## 2015-08-08 DIAGNOSIS — N186 End stage renal disease: Secondary | ICD-10-CM | POA: Diagnosis not present

## 2015-08-08 DIAGNOSIS — D631 Anemia in chronic kidney disease: Secondary | ICD-10-CM | POA: Diagnosis not present

## 2015-08-09 ENCOUNTER — Ambulatory Visit: Payer: Medicare Other | Admitting: Physical Therapy

## 2015-08-09 ENCOUNTER — Encounter: Payer: Self-pay | Admitting: Physical Therapy

## 2015-08-09 DIAGNOSIS — Z7409 Other reduced mobility: Secondary | ICD-10-CM

## 2015-08-09 DIAGNOSIS — R531 Weakness: Secondary | ICD-10-CM

## 2015-08-09 DIAGNOSIS — R269 Unspecified abnormalities of gait and mobility: Secondary | ICD-10-CM

## 2015-08-09 DIAGNOSIS — R5381 Other malaise: Secondary | ICD-10-CM

## 2015-08-09 DIAGNOSIS — R6889 Other general symptoms and signs: Secondary | ICD-10-CM

## 2015-08-09 DIAGNOSIS — R2991 Unspecified symptoms and signs involving the musculoskeletal system: Secondary | ICD-10-CM

## 2015-08-09 DIAGNOSIS — H401112 Primary open-angle glaucoma, right eye, moderate stage: Secondary | ICD-10-CM | POA: Diagnosis not present

## 2015-08-09 DIAGNOSIS — R29818 Other symptoms and signs involving the nervous system: Secondary | ICD-10-CM | POA: Diagnosis not present

## 2015-08-09 DIAGNOSIS — R2689 Other abnormalities of gait and mobility: Secondary | ICD-10-CM

## 2015-08-09 NOTE — Therapy (Signed)
[facility] [address] [place], [place], [address] Phone: [phone]   Fax:  [phone]  Physical Therapy Treatment  Patient Details  Name: Oscar Castillo MRN: [id number] Date of Birth: Nov 30, 1950 Referring Provider: [name], MD  Encounter Date: 08/09/2015   08/09/15 1021  PT Visits / Re-Eval  Visit Number 13  Number of Visits 26  Date for PT Re-Evaluation 09/20/15  Authorization  Authorization Type Medicare Do G-Code & progress reports every 10 visits  PT Time Calculation  PT Start Time 1018  PT Stop Time 1100  PT Time Calculation (min) 42 min  PT - End of Session  Equipment Utilized During Treatment Gait belt  Activity Tolerance Patient tolerated treatment well;Patient limited by pain  Behavior During Therapy [facility] for tasks assessed/performed     Past Medical History  Diagnosis Date  . ESRD on hemodialysis ([place]) 05/05/2007    ESRD due to DM/HTN. Started dialysis in November 2013.  HD TTS at [facility] on [place].  [name] BACK PAIN, LUMBAR, CHRONIC 08/06/2009  . BENIGN PROSTATIC HYPERTROPHY 08/01/2010  . CEREBROVASCULAR ACCIDENT, HX OF 08/06/2009  . CHOLELITHIASIS 08/01/2010  . CONGESTIVE HEART FAILURE 03/18/2009  . DEPRESSION 03/18/2009  . DIABETES MELLITUS, TYPE II 03/25/2007  . ERECTILE DYSFUNCTION 03/25/2007  . GERD 03/25/2007  . HEPATITIS C, HX OF 03/25/2007  . HYPERTENSION 03/25/2007  . Morbid obesity ([place]) 03/25/2007  . NEPHROLITHIASIS, HX OF 03/18/2009  . Complication of anesthesia     wife states pt had trouble waking up with his last surgery in Nov., 2014  . History of Clostridium difficile   . Hemorrhoids   . Anemia   . Antral ulcer 2014    small    Past Surgical History  Procedure Laterality Date  . Nephrectomy      partial RR  . Av fistula placement  06/14/2012    Procedure: ARTERIOVENOUS (AV) FISTULA CREATION;  Surgeon: Angelia Mould, MD;  Location: [facility] OR;  Service: Vascular;   Laterality: Left;  Left basilic vein transposition with fistula.  . Tibia im nail insertion Left 09/09/2012    Procedure: INTRAMEDULLARY (IM) NAIL TIBIAL;  Surgeon: Johnny Bridge, MD;  Location: [place];  Service: Orthopedics;  Laterality: Left;  left tibial nail and open reduction internal fixation left fibula fracture  . Orif fibula fracture Left 09/09/2012    Procedure: OPEN REDUCTION INTERNAL FIXATION (ORIF) FIBULA FRACTURE;  Surgeon: Johnny Bridge, MD;  Location: [place];  Service: Orthopedics;  Laterality: Left;  . Colonoscopy N/A 10/28/2012    Procedure: COLONOSCOPY;  Surgeon: Jeryl Columbia, MD;  Location: [facility] ENDOSCOPY;  Service: Endoscopy;  Laterality: N/A;  . Esophagogastroduodenoscopy N/A 11/02/2012    Procedure: ESOPHAGOGASTRODUODENOSCOPY (EGD);  Surgeon: Cleotis Nipper, MD;  Location: [facility] ENDOSCOPY;  Service: Endoscopy;  Laterality: N/A;  . Colonoscopy N/A 11/02/2012    Procedure: COLONOSCOPY;  Surgeon: Cleotis Nipper, MD;  Location: [facility] ENDOSCOPY;  Service: Endoscopy;  Laterality: N/A;  . Colonoscopy N/A 11/03/2012    Procedure: COLONOSCOPY;  Surgeon: Cleotis Nipper, MD;  Location: [facility] ENDOSCOPY;  Service: Endoscopy;  Laterality: N/A;  . Givens capsule study N/A 11/04/2012    Procedure: GIVENS CAPSULE STUDY;  Surgeon: Cleotis Nipper, MD;  Location: [facility] ENDOSCOPY;  Service: Endoscopy;  Laterality: N/A;  . Enteroscopy N/A 11/08/2012    Procedure: ENTEROSCOPY;  Surgeon: Wonda Horner, MD;  Location: [facility] ENDOSCOPY;  Service: Endoscopy;  Laterality: N/A;  . Amputation Left 05/12/2013  Procedure: AMPUTATION RAY;  Surgeon: Newt Minion, MD;  Location: [facility];  Service: Orthopedics;  Laterality: Left;  Left Foot 1st Ray Amputation  . Eye surgery Left     to remove scar tissue  . Amputation Left 06/09/2013    Procedure: AMPUTATION BELOW KNEE;  Surgeon: Newt Minion, MD;  Location: [place];  Service: Orthopedics;  Laterality: Left;  Left Below Knee Amputation and removal proximal screws IM tibial  nail  . Hardware removal Left 06/09/2013    Procedure: HARDWARE REMOVAL;  Surgeon: Newt Minion, MD;  Location: [place];  Service: Orthopedics;  Laterality: Left;  Left Below Knee Amputation  and Removal proximal screws IM tibial nail  . Amputation Right 09/08/2013    Procedure: AMPUTATION BELOW KNEE;  Surgeon: Newt Minion, MD;  Location: [place];  Service: Orthopedics;  Laterality: Right;  Right Below Knee Amputation  . Amputation Right 10/11/2013    Procedure: AMPUTATION BELOW KNEE;  Surgeon: Newt Minion, MD;  Location: [place];  Service: Orthopedics;  Laterality: Right;  Right Below Knee Amputation Revision    There were no vitals filed for this visit.  Visit Diagnosis:   Generalized weakness    Balance problems     Debility     Abnormality of gait    Impaired transfers    Impaired functional mobility and activity tolerance    Decreased functional activity tolerance        Subjective Assessment - 08/09/15 1020    Subjective No new complaints. Back on normal dialysis schedule now. No falls.   Patient is accompained by: Family member  spouse   Patient Stated Goals To be able to walk more & hopefully by himself with prostheses   Currently in Pain? Yes   Pain Score 6    Pain Location Hand   Pain Orientation Right;Left   Pain Descriptors / Indicators Aching;Sore;Cramping;Tightness   Pain Type Chronic pain   Pain Radiating Towards up arms   Pain Onset More than a month ago   Pain Frequency Constant   Aggravating Factors  dialysis   Pain Relieving Factors rest        08/09/15 1023  Transfers  Sit to Stand 3: Mod assist;With upper extremity assist;With armrests;From chair/3-in-1 (2 person assist today)  Sit to Stand Details Verbal cues for sequencing;Verbal cues for technique;Tactile cues for weight shifting;Tactile cues for posture;Tactile cues for sequencing;Manual facilitation for weight shifting  Stand to Sit 3: Mod assist;With armrests;To chair/3-in-1;Uncontrolled  descent  Stand to Sit Details (indicate cue type and reason) Verbal cues for precautions/safety;Verbal cues for technique;Verbal cues for sequencing;Manual facilitation for weight shifting;Tactile cues for weight shifting;Tactile cues for posture;Tactile cues for sequencing  Ambulation/Gait  Ambulation/Gait Yes  Ambulation/Gait Assistance 3: Mod assist;Other (comment) (2cd person SBA with straight path, mod with turning)  Ambulation/Gait Assistance Details multimodal cues on knee extension, upright posture, step lenght and walker management              Ambulation Distance (Feet) 12 Feet (x1, 14 x1)  Assistive device Prostheses;Rolling walker  Gait Pattern Decreased step length - right;Decreased step length - left;Decreased stride length;Right flexed knee in stance;Left flexed knee in stance;Trunk flexed;Narrow base of support  Ambulation Surface Level;Indoor     seated edge of mat: Passive stretching to bil hamstrings and bil  hip adductors, long duration hold with progressive stretching to each Fwd flexion/fold,then back up into trunk extension x 10 reps Curl ups with inverted chair behind pt x  15reps, emphasis on neutral positioning Lateral trunk bending (elbow taps to yoga blocks next to hips on mat) x 10 each way.         PT Short Term Goals - 07/31/15 1344    PT SHORT TERM GOAL #1   Title Patient sit to /from stand w/c to sink with moderate assist. (NEW [employer] Date: 08/30/2015)   Baseline 07/24/15: max assist to achieve full standing at sink   Status On-going   PT SHORT TERM GOAL #2   Title Patient & wife demonstrate / verbalize HEP / updated exercise program for fitness center. (Target Date: 07/26/2015)   Baseline 07/24/15: doing HEP on non dialysis days, seated ones every day and [facility] on [place] per family.    Status Achieved   PT SHORT TERM GOAL #3   Title Patient ambulates 84' with RW & prostheses with moderate assist (2 people for safety) (Target Date:  07/26/2015)   Baseline 07/24/15: pt able to ambulate short distance with mod assist of 2 people, did not reach 40 feet goal   Status Partially Met   PT SHORT TERM GOAL #4   Title Patient tolerates 30 seconds of standing with RW with minA. (Target Date: 08/30/2015)   Time 1   Period Months   Status New   PT SHORT TERM GOAL #5   Title Patient ambulates 34' with RW / prostheses & turns 90* to sit in chair with armrests with moderate assist (can have 2nd person for safety). Target Date: 08/30/2015   Time 1   Period Months   Status New           PT Long Term Goals - 07/17/15 1320    PT LONG TERM GOAL #1   Title Patient and wife demonstrate proper prosthetic care. (Target Date: 09/20/2015)   Time 3   Period Months   Status On-going   PT LONG TERM GOAL #2   Title Pt & wife verbalize & demonstrate ongoing HEP / fitness plan.  (Target Date: 09/20/2015)   Time 3   Period Months   Status On-going   PT LONG TERM GOAL #3   Title stand pivot transfers with rolling walker & prostheses with supervision.  (Target Date: 09/20/2015)   Time 3   Period Months   Status On-going   PT LONG TERM GOAL #4   Title adjusts clothes & reaches 2" anteriorly while standing with RW support with minimal assist.  (Target Date: 09/20/2015)   Time 3   Period Months   Status On-going   PT LONG TERM GOAL #5   Title ambulate 72' with rolling walker & prostheses with minimal assist.  (Target Date: 09/20/2015)   Time 3   Period Months   Status On-going        08/09/15 1022  Plan  Clinical Impression Statement Pt continues to make slow, steady progress toward goals. Still feeling tired from dialysis today. Improved ability to hold walker without increased hand pain reported. Continues to need increased assistance with turning while standing vs gait on straight pathways.  Pt will benefit from skilled therapeutic intervention in order to improve on the following deficits Abnormal gait;Decreased activity  tolerance;Decreased balance;Decreased endurance;Decreased mobility;Decreased range of motion;Decreased strength;Postural dysfunction;Prosthetic Dependency  Rehab Potential Good  PT Frequency 2x / week  PT Duration Other (comment) (13 weeks (90 days))  PT Treatment/Interventions ADLs/Self Care Home Management;DME Instruction;Gait training;Stair training;Functional mobility training;Therapeutic activities;Therapeutic exercise;Balance training;Neuromuscular re-education;Patient/family education;Prosthetic Training  PT Next Visit Plan Work towards updated  STGs.  Consulted and Agree with Plan of Care Patient;Family member/caregiver  Family Member Consulted wife        Problem List Patient Active Problem List   Diagnosis Date Noted  . Diarrhea 08/14/2014  . Dehydration 10/02/2013  . Fever 10/02/2013  . Altered mental status 10/01/2013  . Encephalopathy, toxic 10/01/2013  . Encephalopathy, metabolic 79/39/0300  . CVA (cerebral vascular accident) ([employer]) 09/28/2013  . FTT (failure to thrive) in adult 09/28/2013  . Acute confusional state 09/28/2013  . Ulcer of sacral region, stage 3 ([place]) 09/26/2013  . S/P BKA (below knee amputation) bilateral ([place]) 09/08/2013  . ESRD on hemodialysis ([place]) 05/09/2013  . TIA (transient ischemic attack) 02/20/2013  . Acute blood loss anemia 10/28/2012  . Chronic combined systolic (EF 92%) and grade 2diastolic congestive heart failure 10/28/2012  . Obstipation 10/28/2012  . GI bleed 10/27/2012  . Mass in rectum 10/27/2012  . DM (diabetes mellitus) type I controlled with renal manifestation ([place]) 09/08/2012  . LVH (left ventricular hypertrophy)-severe concentric 06/13/2012  . Anemia due to chronic illness 06/12/2012  . Hyperlipidemia 01/30/2011  . CHOLELITHIASIS 08/01/2010  . BENIGN PROSTATIC HYPERTROPHY 08/01/2010  . CEREBROVASCULAR ACCIDENT, HX OF 08/06/2009  . Depression 03/18/2009  . Acute combined systolic and diastolic heart failure, NYHA class  2-EF 45% 03/18/2009  . NEPHROLITHIASIS, HX OF 03/18/2009  . Morbid obesity ([place]) 03/25/2007  . Essential hypertension 03/25/2007  . GERD 03/25/2007  . Chronic hepatitis C without hepatic coma ([place]) 03/25/2007    Willow Ora 08/09/2015, 10:51 AM  Willow Ora, PTA, [facility] [address] [place], [place] [address] [phone] 08/13/2015, 2:35 PM   Name: Oscar Castillo MRN: [id number] Date of Birth: 09/19/1950

## 2015-08-10 DIAGNOSIS — N186 End stage renal disease: Secondary | ICD-10-CM | POA: Diagnosis not present

## 2015-08-10 DIAGNOSIS — D631 Anemia in chronic kidney disease: Secondary | ICD-10-CM | POA: Diagnosis not present

## 2015-08-10 DIAGNOSIS — E1122 Type 2 diabetes mellitus with diabetic chronic kidney disease: Secondary | ICD-10-CM | POA: Diagnosis not present

## 2015-08-10 DIAGNOSIS — N2581 Secondary hyperparathyroidism of renal origin: Secondary | ICD-10-CM | POA: Diagnosis not present

## 2015-08-10 DIAGNOSIS — Z4931 Encounter for adequacy testing for hemodialysis: Secondary | ICD-10-CM | POA: Diagnosis not present

## 2015-08-13 DIAGNOSIS — N186 End stage renal disease: Secondary | ICD-10-CM | POA: Diagnosis not present

## 2015-08-13 DIAGNOSIS — Z4931 Encounter for adequacy testing for hemodialysis: Secondary | ICD-10-CM | POA: Diagnosis not present

## 2015-08-13 DIAGNOSIS — E1122 Type 2 diabetes mellitus with diabetic chronic kidney disease: Secondary | ICD-10-CM | POA: Diagnosis not present

## 2015-08-13 DIAGNOSIS — D631 Anemia in chronic kidney disease: Secondary | ICD-10-CM | POA: Diagnosis not present

## 2015-08-13 DIAGNOSIS — N2581 Secondary hyperparathyroidism of renal origin: Secondary | ICD-10-CM | POA: Diagnosis not present

## 2015-08-14 ENCOUNTER — Ambulatory Visit: Payer: Medicare Other | Admitting: Physical Therapy

## 2015-08-14 ENCOUNTER — Encounter: Payer: Self-pay | Admitting: Physical Therapy

## 2015-08-14 ENCOUNTER — Ambulatory Visit (HOSPITAL_COMMUNITY)
Admission: RE | Admit: 2015-08-14 | Discharge: 2015-08-14 | Disposition: A | Discharge status: Home / Self Care | Payer: Medicare Other | Source: Ambulatory Visit | Attending: Internal Medicine | Admitting: Internal Medicine

## 2015-08-14 DIAGNOSIS — R531 Weakness: Secondary | ICD-10-CM | POA: Diagnosis not present

## 2015-08-14 DIAGNOSIS — Z89512 Acquired absence of left leg below knee: Secondary | ICD-10-CM

## 2015-08-14 DIAGNOSIS — Z7409 Other reduced mobility: Secondary | ICD-10-CM | POA: Diagnosis not present

## 2015-08-14 DIAGNOSIS — N281 Cyst of kidney, acquired: Secondary | ICD-10-CM | POA: Diagnosis not present

## 2015-08-14 DIAGNOSIS — Z89511 Acquired absence of right leg below knee: Secondary | ICD-10-CM

## 2015-08-14 DIAGNOSIS — R29818 Other symptoms and signs involving the nervous system: Secondary | ICD-10-CM | POA: Diagnosis not present

## 2015-08-14 DIAGNOSIS — B182 Chronic viral hepatitis C: Secondary | ICD-10-CM | POA: Insufficient documentation

## 2015-08-14 DIAGNOSIS — R269 Unspecified abnormalities of gait and mobility: Secondary | ICD-10-CM | POA: Diagnosis not present

## 2015-08-14 DIAGNOSIS — R2689 Other abnormalities of gait and mobility: Secondary | ICD-10-CM

## 2015-08-14 DIAGNOSIS — R5381 Other malaise: Secondary | ICD-10-CM | POA: Diagnosis not present

## 2015-08-14 DIAGNOSIS — R2991 Unspecified symptoms and signs involving the musculoskeletal system: Secondary | ICD-10-CM | POA: Diagnosis not present

## 2015-08-14 DIAGNOSIS — K8020 Calculus of gallbladder without cholecystitis without obstruction: Secondary | ICD-10-CM | POA: Insufficient documentation

## 2015-08-14 DIAGNOSIS — M24669 Ankylosis, unspecified knee: Secondary | ICD-10-CM

## 2015-08-14 DIAGNOSIS — R6889 Other general symptoms and signs: Secondary | ICD-10-CM

## 2015-08-15 DIAGNOSIS — N186 End stage renal disease: Secondary | ICD-10-CM | POA: Diagnosis not present

## 2015-08-15 DIAGNOSIS — Z4931 Encounter for adequacy testing for hemodialysis: Secondary | ICD-10-CM | POA: Diagnosis not present

## 2015-08-15 DIAGNOSIS — E1122 Type 2 diabetes mellitus with diabetic chronic kidney disease: Secondary | ICD-10-CM | POA: Diagnosis not present

## 2015-08-15 DIAGNOSIS — D631 Anemia in chronic kidney disease: Secondary | ICD-10-CM | POA: Diagnosis not present

## 2015-08-15 DIAGNOSIS — N2581 Secondary hyperparathyroidism of renal origin: Secondary | ICD-10-CM | POA: Diagnosis not present

## 2015-08-15 MED FILL — *ZEPATIER 50-100 MG TABLET: 50-100 | 28 days supply | Qty: 28 | Fill #0

## 2015-08-15 NOTE — Therapy (Signed)
[facility] [address] [place], [place], [address] Phone: [phone]   Fax:  [phone]  Physical Therapy Treatment  Patient Details  Name: Oscar Castillo MRN: [id number] Date of Birth: 15-Aug-1950 Referring Provider: [name], MD  Encounter Date: 08/14/2015      PT End of Session - 08/14/15 1315    Visit Number 14   Number of Visits 26   Date for PT Re-Evaluation 09/20/15   Authorization Type Medicare Do G-Code & progress reports every 10 visits   PT Start Time 1230   PT Stop Time 1315   PT Time Calculation (min) 45 min   Equipment Utilized During Treatment Gait belt   Activity Tolerance Patient tolerated treatment well;Patient limited by pain   Behavior During Therapy [facility] for tasks assessed/performed      Past Medical History  Diagnosis Date  . ESRD on hemodialysis ([place]) 05/05/2007    ESRD due to DM/HTN. Started dialysis in November 2013.  HD TTS at [facility] on [address].  [name] BACK PAIN, LUMBAR, CHRONIC 08/06/2009  . BENIGN PROSTATIC HYPERTROPHY 08/01/2010  . CEREBROVASCULAR ACCIDENT, HX OF 08/06/2009  . CHOLELITHIASIS 08/01/2010  . CONGESTIVE HEART FAILURE 03/18/2009  . DEPRESSION 03/18/2009  . DIABETES MELLITUS, TYPE II 03/25/2007  . ERECTILE DYSFUNCTION 03/25/2007  . GERD 03/25/2007  . HEPATITIS C, HX OF 03/25/2007  . HYPERTENSION 03/25/2007  . Morbid obesity ([place]) 03/25/2007  . NEPHROLITHIASIS, HX OF 03/18/2009  . Complication of anesthesia     wife states pt had trouble waking up with his last surgery in Nov., 2014  . History of Clostridium difficile   . Hemorrhoids   . Anemia   . Antral ulcer 2014    small    Past Surgical History  Procedure Laterality Date  . Nephrectomy      partial RR  . Av fistula placement  06/14/2012    Procedure: ARTERIOVENOUS (AV) FISTULA CREATION;  Surgeon: Angelia Mould, MD;  Location: [facility] OR;  Service: Vascular;  Laterality: Left;  Left basilic vein transposition  with fistula.  . Tibia im nail insertion Left 09/09/2012    Procedure: INTRAMEDULLARY (IM) NAIL TIBIAL;  Surgeon: Johnny Bridge, MD;  Location: [facility];  Service: Orthopedics;  Laterality: Left;  left tibial nail and open reduction internal fixation left fibula fracture  . Orif fibula fracture Left 09/09/2012    Procedure: OPEN REDUCTION INTERNAL FIXATION (ORIF) FIBULA FRACTURE;  Surgeon: Johnny Bridge, MD;  Location: [facility];  Service: Orthopedics;  Laterality: Left;  . Colonoscopy N/A 10/28/2012    Procedure: COLONOSCOPY;  Surgeon: Jeryl Columbia, MD;  Location: [facility] ENDOSCOPY;  Service: Endoscopy;  Laterality: N/A;  . Esophagogastroduodenoscopy N/A 11/02/2012    Procedure: ESOPHAGOGASTRODUODENOSCOPY (EGD);  Surgeon: Cleotis Nipper, MD;  Location: [facility] ENDOSCOPY;  Service: Endoscopy;  Laterality: N/A;  . Colonoscopy N/A 11/02/2012    Procedure: COLONOSCOPY;  Surgeon: Cleotis Nipper, MD;  Location: [facility] ENDOSCOPY;  Service: Endoscopy;  Laterality: N/A;  . Colonoscopy N/A 11/03/2012    Procedure: COLONOSCOPY;  Surgeon: Cleotis Nipper, MD;  Location: [facility] ENDOSCOPY;  Service: Endoscopy;  Laterality: N/A;  . Givens capsule study N/A 11/04/2012    Procedure: GIVENS CAPSULE STUDY;  Surgeon: Cleotis Nipper, MD;  Location: [facility] ENDOSCOPY;  Service: Endoscopy;  Laterality: N/A;  . Enteroscopy N/A 11/08/2012    Procedure: ENTEROSCOPY;  Surgeon: Wonda Horner, MD;  Location: [facility] ENDOSCOPY;  Service: Endoscopy;  Laterality: N/A;  .  Amputation Left 05/12/2013    Procedure: AMPUTATION RAY;  Surgeon: Newt Minion, MD;  Location: [facility];  Service: Orthopedics;  Laterality: Left;  Left Foot 1st Ray Amputation  . Eye surgery Left     to remove scar tissue  . Amputation Left 06/09/2013    Procedure: AMPUTATION BELOW KNEE;  Surgeon: Newt Minion, MD;  Location: [facility];  Service: Orthopedics;  Laterality: Left;  Left Below Knee Amputation and removal proximal screws IM tibial nail  . Hardware removal Left 06/09/2013     Procedure: HARDWARE REMOVAL;  Surgeon: Newt Minion, MD;  Location: [facility];  Service: Orthopedics;  Laterality: Left;  Left Below Knee Amputation  and Removal proximal screws IM tibial nail  . Amputation Right 09/08/2013    Procedure: AMPUTATION BELOW KNEE;  Surgeon: Newt Minion, MD;  Location: [facility];  Service: Orthopedics;  Laterality: Right;  Right Below Knee Amputation  . Amputation Right 10/11/2013    Procedure: AMPUTATION BELOW KNEE;  Surgeon: Newt Minion, MD;  Location: [facility];  Service: Orthopedics;  Laterality: Right;  Right Below Knee Amputation Revision    There were no vitals filed for this visit.  Visit Diagnosis:  Generalized weakness  Balance problems  Abnormality of gait  Impaired transfers  Impaired functional mobility and activity tolerance  Decreased functional activity tolerance  Decreased range of knee movement, unspecified laterality  Status post bilateral below knee amputation (HCC)      Subjective Assessment - 08/14/15 1238    Subjective He had liver US this morning but does not know results. He continues to report extreme fatigue & pain.    Currently in Pain? Yes   Pain Score 4    Pain Location Hand   Pain Orientation Right;Left   Pain Descriptors / Indicators Aching;Sore;Cramping;Tightness   Pain Type Chronic pain   Pain Onset More than a month ago   Pain Frequency Constant   Aggravating Factors  dialysis   Pain Relieving Factors rest                         [facility] Adult PT Treatment/Exercise - 08/14/15 1230    Transfers   Sit to Stand 3: Mod assist;With upper extremity assist;With armrests;From chair/3-in-1  1 person assist   Sit to Stand Details Verbal cues for sequencing;Verbal cues for technique;Tactile cues for weight shifting;Tactile cues for posture;Tactile cues for sequencing;Manual facilitation for weight shifting   Sit to Stand Details (indicate cue type and reason) verbal cues to use both UEs & LEs to push to full  upright   Stand to Sit 4: Min assist;With upper extremity assist;With armrests;To chair/3-in-1;Uncontrolled descent   Stand to Sit Details (indicate cue type and reason) Verbal cues for precautions/safety;Verbal cues for technique;Verbal cues for sequencing;Manual facilitation for weight shifting;Tactile cues for weight shifting;Tactile cues for posture;Tactile cues for sequencing   Stand to Sit Details verbal cues to use LEs to assist    Stand Pivot Transfers 2: Max assist;1: +2 Total assist   Stand Pivot Transfers: Patient Percentage 70%   Stand Pivot Transfer Details (indicate cue type and reason) turning 90* at end of gait to position to sit with cues to contrinue to push up out of squat position untile positoned n front of chair   Ambulation/Gait   Ambulation/Gait Yes   Ambulation/Gait Assistance 3: Mod assist;Other (comment);1: +1 Total assist;2: Max assist  modA first turn to sit & total assist 2nd gait  Ambulation Distance (Feet) 25 Feet  16' & 25'    Assistive device Prostheses;Rolling walker   Gait Pattern Decreased step length - right;Decreased step length - left;Decreased stride length;Right flexed knee in stance;Left flexed knee in stance;Trunk flexed;Narrow base of support   Ambulation Surface Level;Indoor   Knee/Hip Exercises: Aerobic   Nustep Level 5 with BUEs & BLEs X 6 minutes with cues on max knee ROM   Knee/Hip Exercises: Machines for Strengthening   Total Gym Leg Press 60# 10 reps, 30# 5reps single LE both Rt & Lt,,   PT manual cues for full knee extension.   Prosthetics   Prosthetic Care Comments  Patient arrived with bilateral prostheses & liners properly donned.    Current prosthetic wear tolerance (days/week)  7 days   Current prosthetic wear tolerance (#hours/day)  90% of awake hours, including dialysis                  PT Short Term Goals - 07/31/15 1344    PT SHORT TERM GOAL #1   Title Patient sit to /from stand w/c to sink with moderate assist.  (NEW Target Date: 08/30/2015)   Baseline 07/24/15: max assist to achieve full standing at sink   Status On-going   PT SHORT TERM GOAL #2   Title Patient & wife demonstrate / verbalize HEP / updated exercise program for fitness center. (Target Date: 07/26/2015)   Baseline 07/24/15: doing HEP on non dialysis days, seated ones every day and [facility] [address] per family.    Status Achieved   PT SHORT TERM GOAL #3   Title Patient ambulates 52' with RW & prostheses with moderate assist (2 people for safety) (Target Date: 07/26/2015)   Baseline 07/24/15: pt able to ambulate short distance with mod assist of 2 people, did not reach 40 feet goal   Status Partially Met   PT SHORT TERM GOAL #4   Title Patient tolerates 30 seconds of standing with RW with minA. (Target Date: 08/30/2015)   Time 1   Period Months   Status New   PT SHORT TERM GOAL #5   Title Patient ambulates 71' with RW / prostheses & turns 90* to sit in chair with armrests with moderate assist (can have 2nd person for safety). Target Date: 08/30/2015   Time 1   Period Months   Status New           PT Long Term Goals - 07/17/15 1320    PT LONG TERM GOAL #1   Title Patient and wife demonstrate proper prosthetic care. (Target Date: 09/20/2015)   Time 3   Period Months   Status On-going   PT LONG TERM GOAL #2   Title Pt & wife verbalize & demonstrate ongoing HEP / fitness plan.  (Target Date: 09/20/2015)   Time 3   Period Months   Status On-going   PT LONG TERM GOAL #3   Title stand pivot transfers with rolling walker & prostheses with supervision.  (Target Date: 09/20/2015)   Time 3   Period Months   Status On-going   PT LONG TERM GOAL #4   Title adjusts clothes & reaches 2" anteriorly while standing with RW support with minimal assist.  (Target Date: 09/20/2015)   Time 3   Period Months   Status On-going   PT LONG TERM GOAL #5   Title ambulate 58' with rolling walker & prostheses with minimal assist.  (Target Date:  09/20/2015)   Time 3  Period Months   Status On-going               Plan - 08/14/15 1315    Clinical Impression Statement Patient had less pain prior to session but spiked with activities today & increased fatigue. Patient improved distance ambulated but fatigued to point of total assist to turn 90* to sit.    Pt will benefit from skilled therapeutic intervention in order to improve on the following deficits Abnormal gait;Decreased activity tolerance;Decreased balance;Decreased endurance;Decreased mobility;Decreased range of motion;Decreased strength;Postural dysfunction;Prosthetic Dependency   Rehab Potential Good   PT Frequency 2x / week   PT Duration Other (comment)  13 weeks (90 days)   PT Treatment/Interventions ADLs/Self Care Home Management;DME Instruction;Gait training;Stair training;Functional mobility training;Therapeutic activities;Therapeutic exercise;Balance training;Neuromuscular re-education;Patient/family education;Prosthetic Training   PT Next Visit Plan Work towards updated STGs.   Consulted and Agree with Plan of Care Patient;Family member/caregiver   Family Member Consulted wife        Problem List Patient Active Problem List   Diagnosis Date Noted  . Diarrhea 08/14/2014  . Dehydration 10/02/2013  . Fever 10/02/2013  . Altered mental status 10/01/2013  . Encephalopathy, toxic 10/01/2013  . Encephalopathy, metabolic 99/24/2683  . CVA (cerebral vascular accident) ([place]) 09/28/2013  . FTT (failure to thrive) in adult 09/28/2013  . Acute confusional state 09/28/2013  . Ulcer of sacral region, stage 3 ([place]) 09/26/2013  . S/P BKA (below knee amputation) bilateral ([place]) 09/08/2013  . ESRD on hemodialysis ([place]) 05/09/2013  . TIA (transient ischemic attack) 02/20/2013  . Acute blood loss anemia 10/28/2012  . Chronic combined systolic (EF 41%) and grade 2diastolic congestive heart failure 10/28/2012  . Obstipation 10/28/2012  . GI bleed 10/27/2012  . Mass in  rectum 10/27/2012  . DM (diabetes mellitus) type I controlled with renal manifestation ([place]) 09/08/2012  . LVH (left ventricular hypertrophy)-severe concentric 06/13/2012  . Anemia due to chronic illness 06/12/2012  . Hyperlipidemia 01/30/2011  . CHOLELITHIASIS 08/01/2010  . BENIGN PROSTATIC HYPERTROPHY 08/01/2010  . CEREBROVASCULAR ACCIDENT, HX OF 08/06/2009  . Depression 03/18/2009  . Acute combined systolic and diastolic heart failure, NYHA class 2-EF 45% 03/18/2009  . NEPHROLITHIASIS, HX OF 03/18/2009  . Morbid obesity ([place]) 03/25/2007  . Essential hypertension 03/25/2007  . GERD 03/25/2007  . Chronic hepatitis C without hepatic coma ([place]) 03/25/2007    Reiana Poteet PT, DPT 08/15/2015, 8:38 AM  [facility] [address] [place], [place], [address] Phone: [phone]   Fax:  [phone]  Name: Oscar Castillo MRN: [id number] Date of Birth: 1950/12/20

## 2015-08-16 ENCOUNTER — Ambulatory Visit: Payer: Medicare Other | Admitting: Physical Therapy

## 2015-08-16 ENCOUNTER — Encounter: Payer: Self-pay | Admitting: Physical Therapy

## 2015-08-16 DIAGNOSIS — Z7409 Other reduced mobility: Secondary | ICD-10-CM | POA: Diagnosis not present

## 2015-08-16 DIAGNOSIS — R5381 Other malaise: Secondary | ICD-10-CM | POA: Diagnosis not present

## 2015-08-16 DIAGNOSIS — R269 Unspecified abnormalities of gait and mobility: Secondary | ICD-10-CM

## 2015-08-16 DIAGNOSIS — R6889 Other general symptoms and signs: Secondary | ICD-10-CM

## 2015-08-16 DIAGNOSIS — R2991 Unspecified symptoms and signs involving the musculoskeletal system: Secondary | ICD-10-CM

## 2015-08-16 DIAGNOSIS — R531 Weakness: Secondary | ICD-10-CM | POA: Diagnosis not present

## 2015-08-16 DIAGNOSIS — R29818 Other symptoms and signs involving the nervous system: Secondary | ICD-10-CM | POA: Diagnosis not present

## 2015-08-16 DIAGNOSIS — R2689 Other abnormalities of gait and mobility: Secondary | ICD-10-CM

## 2015-08-16 NOTE — Therapy (Signed)
[facility] [address] [place], [place], [address] Phone: [phone]   Fax:  [phone]  Physical Therapy Treatment  Patient Details  Name: Oscar Castillo MRN: [id number] Date of Birth: 04/24/1951 Referring Provider: [name], MD  Encounter Date: 08/16/2015      PT End of Session - 08/16/15 1239    Visit Number 15   Number of Visits 26   Date for PT Re-Evaluation 09/20/15   Authorization Type Medicare Do G-Code & progress reports every 10 visits   PT Start Time 5329   PT Stop Time 1315   PT Time Calculation (min) 40 min   Equipment Utilized During Treatment Gait belt   Activity Tolerance Patient tolerated treatment well;Patient limited by pain   Behavior During Therapy [facility] for tasks assessed/performed      Past Medical History  Diagnosis Date  . ESRD on hemodialysis ([place]) 05/05/2007    ESRD due to DM/HTN. Started dialysis in November 2013.  HD TTS at [facility] on [place].  [name] BACK PAIN, LUMBAR, CHRONIC 08/06/2009  . BENIGN PROSTATIC HYPERTROPHY 08/01/2010  . CEREBROVASCULAR ACCIDENT, HX OF 08/06/2009  . CHOLELITHIASIS 08/01/2010  . CONGESTIVE HEART FAILURE 03/18/2009  . DEPRESSION 03/18/2009  . DIABETES MELLITUS, TYPE II 03/25/2007  . ERECTILE DYSFUNCTION 03/25/2007  . GERD 03/25/2007  . HEPATITIS C, HX OF 03/25/2007  . HYPERTENSION 03/25/2007  . Morbid obesity ([place]) 03/25/2007  . NEPHROLITHIASIS, HX OF 03/18/2009  . Complication of anesthesia     wife states pt had trouble waking up with his last surgery in Nov., 2014  . History of Clostridium difficile   . Hemorrhoids   . Anemia   . Antral ulcer 2014    small    Past Surgical History  Procedure Laterality Date  . Nephrectomy      partial RR  . Av fistula placement  06/14/2012    Procedure: ARTERIOVENOUS (AV) FISTULA CREATION;  Surgeon: Angelia Mould, MD;  Location: [facility] OR;  Service: Vascular;  Laterality: Left;  Left basilic vein transposition  with fistula.  . Tibia im nail insertion Left 09/09/2012    Procedure: INTRAMEDULLARY (IM) NAIL TIBIAL;  Surgeon: Johnny Bridge, MD;  Location: [facility] [place];  Service: Orthopedics;  Laterality: Left;  left tibial nail and open reduction internal fixation left fibula fracture  . Orif fibula fracture Left 09/09/2012    Procedure: OPEN REDUCTION INTERNAL FIXATION (ORIF) FIBULA FRACTURE;  Surgeon: Johnny Bridge, MD;  Location: [place];  Service: Orthopedics;  Laterality: Left;  . Colonoscopy N/A 10/28/2012    Procedure: COLONOSCOPY;  Surgeon: Jeryl Columbia, MD;  Location: [facility] ENDOSCOPY;  Service: Endoscopy;  Laterality: N/A;  . Esophagogastroduodenoscopy N/A 11/02/2012    Procedure: ESOPHAGOGASTRODUODENOSCOPY (EGD);  Surgeon: Cleotis Nipper, MD;  Location: [facility] ENDOSCOPY;  Service: Endoscopy;  Laterality: N/A;  . Colonoscopy N/A 11/02/2012    Procedure: COLONOSCOPY;  Surgeon: Cleotis Nipper, MD;  Location: [facility] ENDOSCOPY;  Service: Endoscopy;  Laterality: N/A;  . Colonoscopy N/A 11/03/2012    Procedure: COLONOSCOPY;  Surgeon: Cleotis Nipper, MD;  Location: [facility] ENDOSCOPY;  Service: Endoscopy;  Laterality: N/A;  . Givens capsule study N/A 11/04/2012    Procedure: GIVENS CAPSULE STUDY;  Surgeon: Cleotis Nipper, MD;  Location: [facility] ENDOSCOPY;  Service: Endoscopy;  Laterality: N/A;  . Enteroscopy N/A 11/08/2012    Procedure: ENTEROSCOPY;  Surgeon: Wonda Horner, MD;  Location: [facility] ENDOSCOPY;  Service: Endoscopy;  Laterality: N/A;  .  Amputation Left 05/12/2013    Procedure: AMPUTATION RAY;  Surgeon: Newt Minion, MD;  Location: [facility] [place];  Service: Orthopedics;  Laterality: Left;  Left Foot 1st Ray Amputation  . Eye surgery Left     to remove scar tissue  . Amputation Left 06/09/2013    Procedure: AMPUTATION BELOW KNEE;  Surgeon: Newt Minion, MD;  Location: [place];  Service: Orthopedics;  Laterality: Left;  Left Below Knee Amputation and removal proximal screws IM tibial nail  . Hardware removal Left 06/09/2013     Procedure: HARDWARE REMOVAL;  Surgeon: Newt Minion, MD;  Location: [place];  Service: Orthopedics;  Laterality: Left;  Left Below Knee Amputation  and Removal proximal screws IM tibial nail  . Amputation Right 09/08/2013    Procedure: AMPUTATION BELOW KNEE;  Surgeon: Newt Minion, MD;  Location: [place];  Service: Orthopedics;  Laterality: Right;  Right Below Knee Amputation  . Amputation Right 10/11/2013    Procedure: AMPUTATION BELOW KNEE;  Surgeon: Newt Minion, MD;  Location: [place];  Service: Orthopedics;  Laterality: Right;  Right Below Knee Amputation Revision    There were no vitals filed for this visit.  Visit Diagnosis:  Generalized weakness  Balance problems  Abnormality of gait  Impaired transfers  Impaired functional mobility and activity tolerance  Decreased functional activity tolerance  Debility      Subjective Assessment - 08/16/15 1237    Subjective Saw Richardson Landry at [place] today before here and had pads added to right socket. Reports it feels better now. No falls.    Patient is accompained by: Family member  spouse   Patient Stated Goals To be able to walk more & hopefully by himself with prostheses   Pain Score 4    Pain Location Hand   Pain Orientation Right;Left   Pain Descriptors / Indicators Aching;Sore;Cramping   Pain Type Chronic pain   Pain Radiating Towards up arms   Pain Onset More than a month ago   Pain Frequency Constant   Aggravating Factors  dialysis   Pain Relieving Factors rest            [facility] Adult PT Treatment/Exercise - 08/16/15 1253    Transfers   Sit to Stand 3: Mod assist;With upper extremity assist;With armrests;From chair/3-in-1   Sit to Stand Details Verbal cues for sequencing;Verbal cues for technique;Tactile cues for weight shifting;Tactile cues for posture;Tactile cues for sequencing;Manual facilitation for weight shifting   Sit to Stand Details (indicate cue type and reason) cues to scoot forward and to use arms/legs to  assist with standing with anterior weight shifting.   Stand to Sit 4: Min assist;With upper extremity assist;With armrests;To chair/3-in-1;Uncontrolled descent   Stand to Sit Details (indicate cue type and reason) Verbal cues for precautions/safety;Verbal cues for technique;Verbal cues for sequencing;Manual facilitation for weight shifting;Tactile cues for weight shifting;Tactile cues for posture;Tactile cues for sequencing   Stand to Sit Details cues to reach back and to use legs to assist with controlled descent   Stand Pivot Transfers 2: Max assist;1: +2 Total assist   Stand Pivot Transfer Details (indicate cue type and reason) mod assist of 2 after 1st gait trial, total assist of 2 to turn and sit safety after 2cd gait trial.,   Ambulation/Gait   Ambulation/Gait Yes   Ambulation/Gait Assistance 3: Mod assist  of 2 people both times   Ambulation/Gait Assistance Details multimodal cues on posture, for increased knee extention in stance phase,    Ambulation  Distance (Feet) 20 Feet  x2 reps   Assistive device Prostheses;Rolling walker   Gait Pattern Decreased step length - right;Decreased step length - left;Decreased stride length;Right flexed knee in stance;Left flexed knee in stance;Trunk flexed;Narrow base of support   Ambulation Surface Level;Indoor   Knee/Hip Exercises: Aerobic   Nustep Level 5 x 4 extremities  X 8 minutes with cues on max knee ROM     seated edge of mat: Partial curl ups (chair inverted behind pt) x 20 reps. Overpressure through legs to hold them in place. "W" with scapular squeeze x 10 reps, cues on upright posture/decreased lumbar flexion Lateral curls (elbow taps to mat) and back up x 10 each side. Did not use yoga blocks today.          PT Short Term Goals - 07/31/15 1344    PT SHORT TERM GOAL #1   Title Patient sit to /from stand w/c to sink with moderate assist. (NEW Target Date: 08/30/2015)   Baseline 07/24/15: max assist to achieve full standing at sink    Status On-going   PT SHORT TERM GOAL #2   Title Patient & wife demonstrate / verbalize HEP / updated exercise program for fitness center. (Target Date: 07/26/2015)   Baseline 07/24/15: doing HEP on non dialysis days, seated ones every day and [facility] on [place] per family.    Status Achieved   PT SHORT TERM GOAL #3   Title Patient ambulates 29' with RW & prostheses with moderate assist (2 people for safety) (Target Date: 07/26/2015)   Baseline 07/24/15: pt able to ambulate short distance with mod assist of 2 people, did not reach 40 feet goal   Status Partially Met   PT SHORT TERM GOAL #4   Title Patient tolerates 30 seconds of standing with RW with minA. (Target Date: 08/30/2015)   Time 1   Period Months   Status New   PT SHORT TERM GOAL #5   Title Patient ambulates 47' with RW / prostheses & turns 90* to sit in chair with armrests with moderate assist (can have 2nd person for safety). Target Date: 08/30/2015   Time 1   Period Months   Status New           PT Long Term Goals - 07/17/15 1320    PT LONG TERM GOAL #1   Title Patient and wife demonstrate proper prosthetic care. (Target Date: 09/20/2015)   Time 3   Period Months   Status On-going   PT LONG TERM GOAL #2   Title Pt & wife verbalize & demonstrate ongoing HEP / fitness plan.  (Target Date: 09/20/2015)   Time 3   Period Months   Status On-going   PT LONG TERM GOAL #3   Title stand pivot transfers with rolling walker & prostheses with supervision.  (Target Date: 09/20/2015)   Time 3   Period Months   Status On-going   PT LONG TERM GOAL #4   Title adjusts clothes & reaches 2" anteriorly while standing with RW support with minimal assist.  (Target Date: 09/20/2015)   Time 3   Period Months   Status On-going   PT LONG TERM GOAL #5   Title ambulate 56' with rolling walker & prostheses with minimal assist.  (Target Date: 09/20/2015)   Time 3   Period Months   Status On-going               Plan - 08/17/15  2031    Clinical Impression  Statement Pt  continues to have increased pain with activity and increased fatigue. Pt making slow progress toward goals.   Pt will benefit from skilled therapeutic intervention in order to improve on the following deficits Abnormal gait;Decreased activity tolerance;Decreased balance;Decreased endurance;Decreased mobility;Decreased range of motion;Decreased strength;Postural dysfunction;Prosthetic Dependency   Rehab Potential Good   PT Frequency 2x / week   PT Duration Other (comment)  13 weeks (90 days)   PT Treatment/Interventions ADLs/Self Care Home Management;DME Instruction;Gait training;Stair training;Functional mobility training;Therapeutic activities;Therapeutic exercise;Balance training;Neuromuscular re-education;Patient/family education;Prosthetic Training   PT Next Visit Plan Work towards updated STGs.   Consulted and Agree with Plan of Care Patient;Family member/caregiver   Family Member Consulted wife        Problem List Patient Active Problem List   Diagnosis Date Noted  . Diarrhea 08/14/2014  . Dehydration 10/02/2013  . Fever 10/02/2013  . Altered mental status 10/01/2013  . Encephalopathy, toxic 10/01/2013  . Encephalopathy, metabolic 85/88/5027  . CVA (cerebral vascular accident) ([place]) 09/28/2013  . FTT (failure to thrive) in adult 09/28/2013  . Acute confusional state 09/28/2013  . Ulcer of sacral region, stage 3 ([place]) 09/26/2013  . S/P BKA (below knee amputation) bilateral ([place]) 09/08/2013  . ESRD on hemodialysis ([place]) 05/09/2013  . TIA (transient ischemic attack) 02/20/2013  . Acute blood loss anemia 10/28/2012  . Chronic combined systolic (EF 74%) and grade 2diastolic congestive heart failure 10/28/2012  . Obstipation 10/28/2012  . GI bleed 10/27/2012  . Mass in rectum 10/27/2012  . DM (diabetes mellitus) type I controlled with renal manifestation ([place]) 09/08/2012  . LVH (left ventricular hypertrophy)-severe concentric 06/13/2012   . Anemia due to chronic illness 06/12/2012  . Hyperlipidemia 01/30/2011  . CHOLELITHIASIS 08/01/2010  . BENIGN PROSTATIC HYPERTROPHY 08/01/2010  . CEREBROVASCULAR ACCIDENT, HX OF 08/06/2009  . Depression 03/18/2009  . Acute combined systolic and diastolic heart failure, NYHA class 2-EF 45% 03/18/2009  . NEPHROLITHIASIS, HX OF 03/18/2009  . Morbid obesity ([place]) 03/25/2007  . Essential hypertension 03/25/2007  . GERD 03/25/2007  . Chronic hepatitis C without hepatic coma ([place]) 03/25/2007    Willow Ora 08/17/2015, 8:35 PM  Willow Ora, PTA, [facility] [address] [place], [place] [address] [phone] 08/17/2015, 8:35 PM   Name: Oscar Castillo MRN: [id number] Date of Birth: 1951/02/03

## 2015-08-20 DIAGNOSIS — N2581 Secondary hyperparathyroidism of renal origin: Secondary | ICD-10-CM | POA: Diagnosis not present

## 2015-08-20 DIAGNOSIS — E1122 Type 2 diabetes mellitus with diabetic chronic kidney disease: Secondary | ICD-10-CM | POA: Diagnosis not present

## 2015-08-20 DIAGNOSIS — D631 Anemia in chronic kidney disease: Secondary | ICD-10-CM | POA: Diagnosis not present

## 2015-08-20 DIAGNOSIS — N186 End stage renal disease: Secondary | ICD-10-CM | POA: Diagnosis not present

## 2015-08-20 DIAGNOSIS — Z4931 Encounter for adequacy testing for hemodialysis: Secondary | ICD-10-CM | POA: Diagnosis not present

## 2015-08-21 ENCOUNTER — Ambulatory Visit: Payer: Medicare Other | Admitting: Physical Therapy

## 2015-08-21 ENCOUNTER — Encounter: Payer: Self-pay | Admitting: Physical Therapy

## 2015-08-21 DIAGNOSIS — R531 Weakness: Secondary | ICD-10-CM

## 2015-08-21 DIAGNOSIS — R269 Unspecified abnormalities of gait and mobility: Secondary | ICD-10-CM | POA: Diagnosis not present

## 2015-08-21 DIAGNOSIS — M79641 Pain in right hand: Secondary | ICD-10-CM

## 2015-08-21 DIAGNOSIS — Z7409 Other reduced mobility: Secondary | ICD-10-CM | POA: Diagnosis not present

## 2015-08-21 DIAGNOSIS — R29818 Other symptoms and signs involving the nervous system: Secondary | ICD-10-CM | POA: Diagnosis not present

## 2015-08-21 DIAGNOSIS — R5381 Other malaise: Secondary | ICD-10-CM | POA: Diagnosis not present

## 2015-08-21 DIAGNOSIS — R2689 Other abnormalities of gait and mobility: Secondary | ICD-10-CM

## 2015-08-21 DIAGNOSIS — R2991 Unspecified symptoms and signs involving the musculoskeletal system: Secondary | ICD-10-CM

## 2015-08-21 DIAGNOSIS — R6889 Other general symptoms and signs: Secondary | ICD-10-CM

## 2015-08-21 NOTE — Therapy (Signed)
[facility] [address] [place], [place], [address] Phone: [phone]   Fax:  [phone]  Physical Therapy Treatment  Patient Details  Name: Oscar Castillo MRN: [id number] Date of Birth: 1951-05-29 Referring Provider: [name], MD  Encounter Date: 08/21/2015      PT End of Session - 08/21/15 1320    Visit Number 16   Number of Visits 26   Date for PT Re-Evaluation 09/20/15   Authorization Type Medicare Do G-Code & progress reports every 10 visits   PT Start Time 1238   PT Stop Time 1320   PT Time Calculation (min) 42 min   Equipment Utilized During Treatment Gait belt   Activity Tolerance Patient tolerated treatment well;Patient limited by pain   Behavior During Therapy [facility] for tasks assessed/performed      Past Medical History  Diagnosis Date  . ESRD on hemodialysis ([place]) 05/05/2007    ESRD due to DM/HTN. Started dialysis in November 2013.  HD TTS at [facility] on [address].  [name] BACK PAIN, LUMBAR, CHRONIC 08/06/2009  . BENIGN PROSTATIC HYPERTROPHY 08/01/2010  . CEREBROVASCULAR ACCIDENT, HX OF 08/06/2009  . CHOLELITHIASIS 08/01/2010  . CONGESTIVE HEART FAILURE 03/18/2009  . DEPRESSION 03/18/2009  . DIABETES MELLITUS, TYPE II 03/25/2007  . ERECTILE DYSFUNCTION 03/25/2007  . GERD 03/25/2007  . HEPATITIS C, HX OF 03/25/2007  . HYPERTENSION 03/25/2007  . Morbid obesity ([place]) 03/25/2007  . NEPHROLITHIASIS, HX OF 03/18/2009  . Complication of anesthesia     wife states pt had trouble waking up with his last surgery in Nov., 2014  . History of Clostridium difficile   . Hemorrhoids   . Anemia   . Antral ulcer 2014    small    Past Surgical History  Procedure Laterality Date  . Nephrectomy      partial RR  . Av fistula placement  06/14/2012    Procedure: ARTERIOVENOUS (AV) FISTULA CREATION;  Surgeon: Angelia Mould, MD;  Location[facility] OR;  Service: Vascular;  Laterality: Left;  Left basilic vein transposition  with fistula.  . Tibia im nail insertion Left 09/09/2012    Procedure: INTRAMEDULLARY (IM) NAIL TIBIAL;  Surgeon: Johnny Bridge, MD;  Location: [place];  Service: Orthopedics;  Laterality: Left;  left tibial nail and open reduction internal fixation left fibula fracture  . Orif fibula fracture Left 09/09/2012    Procedure: OPEN REDUCTION INTERNAL FIXATION (ORIF) FIBULA FRACTURE;  Surgeon: Johnny Bridge, MD;  Location: [place];  Service: Orthopedics;  Laterality: Left;  . Colonoscopy N/A 10/28/2012    Procedure: COLONOSCOPY;  Surgeon: Jeryl Columbia, MD;  Location[facility] ENDOSCOPY;  Service: Endoscopy;  Laterality: N/A;  . Esophagogastroduodenoscopy N/A 11/02/2012    Procedure: ESOPHAGOGASTRODUODENOSCOPY (EGD);  Surgeon: Cleotis Nipper, MD;  Location: [facility] ENDOSCOPY;  Service: Endoscopy;  Laterality: N/A;  . Colonoscopy N/A 11/02/2012    Procedure: COLONOSCOPY;  Surgeon: Cleotis Nipper, MD;  Location: [facility] ENDOSCOPY;  Service: Endoscopy;  Laterality: N/A;  . Colonoscopy N/A 11/03/2012    Procedure: COLONOSCOPY;  Surgeon: Cleotis Nipper, MD;  Location: [facility] ENDOSCOPY;  Service: Endoscopy;  Laterality: N/A;  . Givens capsule study N/A 11/04/2012    Procedure: GIVENS CAPSULE STUDY;  Surgeon: Cleotis Nipper, MD;  Location: [facility] ENDOSCOPY;  Service: Endoscopy;  Laterality: N/A;  . Enteroscopy N/A 11/08/2012    Procedure: ENTEROSCOPY;  Surgeon: Wonda Horner, MD;  Location: [facility] ENDOSCOPY;  Service: Endoscopy;  Laterality: N/A;  .  Amputation Left 05/12/2013    Procedure: AMPUTATION RAY;  Surgeon: Newt Minion, MD;  Location: [place];  Service: Orthopedics;  Laterality: Left;  Left Foot 1st Ray Amputation  . Eye surgery Left     to remove scar tissue  . Amputation Left 06/09/2013    Procedure: AMPUTATION BELOW KNEE;  Surgeon: Newt Minion, MD;  Location: [place];  Service: Orthopedics;  Laterality: Left;  Left Below Knee Amputation and removal proximal screws IM tibial nail  . Hardware removal Left 06/09/2013     Procedure: HARDWARE REMOVAL;  Surgeon: Newt Minion, MD;  Location: [place];  Service: Orthopedics;  Laterality: Left;  Left Below Knee Amputation  and Removal proximal screws IM tibial nail  . Amputation Right 09/08/2013    Procedure: AMPUTATION BELOW KNEE;  Surgeon: Newt Minion, MD;  Location: [place];  Service: Orthopedics;  Laterality: Right;  Right Below Knee Amputation  . Amputation Right 10/11/2013    Procedure: AMPUTATION BELOW KNEE;  Surgeon: Newt Minion, MD;  Location: [place];  Service: Orthopedics;  Laterality: Right;  Right Below Knee Amputation Revision    There were no vitals filed for this visit.  Visit Diagnosis:  Generalized weakness  Balance problems  Impaired transfers  Impaired functional mobility and activity tolerance  Decreased functional activity tolerance  Pain, hand joint, right      Subjective Assessment - 08/21/15 1241    Subjective His right hand is hurting a lot more since dialysis yesterday.    Currently in Pain? Yes   Pain Score 9    Pain Location Hand   Pain Orientation Right   Pain Descriptors / Indicators Aching;Sore;Cramping   Pain Type Chronic pain   Pain Onset More than a month ago  severe pain increased yesterday   Pain Frequency Constant   Aggravating Factors  dialysis   Pain Relieving Factors rest   Multiple Pain Sites No     Therapeutic Activities: PT assessed right hand: flexion of fingers, extension or flexion of wrist, thumb / 5th finger opposition, supination of forearm all increased pain. Sit to stand w/c to RW with maxA 2 reps. Patient unable to support weight on right hand with RW.  Standing frame: 5 minutes X 2 reps with reciprocal UE flexion, forward reach, trunk rotation, trunk extension with forward flexion and terminal hip extension last 10* for upright. Patient needed rests between activities and between repetitions.                               PT Short Term Goals - 07/31/15 1344    PT SHORT  TERM GOAL #1   Title Patient sit to /from stand w/c to sink with moderate assist. (NEW Target Date: 08/30/2015)   Baseline 07/24/15: max assist to achieve full standing at sink   Status On-going   PT SHORT TERM GOAL #2   Title Patient & wife demonstrate / verbalize HEP / updated exercise program for fitness center. (Target Date: 07/26/2015)   Baseline 07/24/15: doing HEP on non dialysis days, seated ones every day and [facility] on [place] per family.    Status Achieved   PT SHORT TERM GOAL #3   Title Patient ambulates 65' with RW & prostheses with moderate assist (2 people for safety) (Target Date: 07/26/2015)   Baseline 07/24/15: pt able to ambulate short distance with mod assist of 2 people, did not reach 40 feet goal  Status Partially Met   PT SHORT TERM GOAL #4   Title Patient tolerates 30 seconds of standing with RW with minA. (Target Date: 08/30/2015)   Time 1   Period Months   Status New   PT SHORT TERM GOAL #5   Title Patient ambulates 2' with RW / prostheses & turns 90* to sit in chair with armrests with moderate assist (can have 2nd person for safety). Target Date: 08/30/2015   Time 1   Period Months   Status New           PT Long Term Goals - 07/17/15 1320    PT LONG TERM GOAL #1   Title Patient and wife demonstrate proper prosthetic care. (Target Date: 09/20/2015)   Time 3   Period Months   Status On-going   PT LONG TERM GOAL #2   Title Pt & wife verbalize & demonstrate ongoing HEP / fitness plan.  (Target Date: 09/20/2015)   Time 3   Period Months   Status On-going   PT LONG TERM GOAL #3   Title stand pivot transfers with rolling walker & prostheses with supervision.  (Target Date: 09/20/2015)   Time 3   Period Months   Status On-going   PT LONG TERM GOAL #4   Title adjusts clothes & reaches 2" anteriorly while standing with RW support with minimal assist.  (Target Date: 09/20/2015)   Time 3   Period Months   Status On-going   PT LONG TERM GOAL #5   Title  ambulate 27' with rolling walker & prostheses with minimal assist.  (Target Date: 09/20/2015)   Time 3   Period Months   Status On-going               Plan - 08/21/15 1320    Clinical Impression Statement Patient's right hand pain too severe to support himself on RW. Patient's right hand appears to be having muscle spasms from physiological response to dialysis.    Pt will benefit from skilled therapeutic intervention in order to improve on the following deficits Abnormal gait;Decreased activity tolerance;Decreased balance;Decreased endurance;Decreased mobility;Decreased range of motion;Decreased strength;Postural dysfunction;Prosthetic Dependency   Rehab Potential Good   PT Frequency 2x / week   PT Duration Other (comment)  13 weeks (90 days)   PT Treatment/Interventions ADLs/Self Care Home Management;DME Instruction;Gait training;Stair training;Functional mobility training;Therapeutic activities;Therapeutic exercise;Balance training;Neuromuscular re-education;Patient/family education;Prosthetic Training   PT Next Visit Plan Work towards updated STGs. Monitor right hand pain.    Consulted and Agree with Plan of Care Patient;Family member/caregiver   Family Member Consulted wife        Problem List Patient Active Problem List   Diagnosis Date Noted  . Diarrhea 08/14/2014  . Dehydration 10/02/2013  . Fever 10/02/2013  . Altered mental status 10/01/2013  . Encephalopathy, toxic 10/01/2013  . Encephalopathy, metabolic 08/04/3233  . CVA (cerebral vascular accident) ([place]) 09/28/2013  . FTT (failure to thrive) in adult 09/28/2013  . Acute confusional state 09/28/2013  . Ulcer of sacral region, stage 3 ([place]) 09/26/2013  . S/P BKA (below knee amputation) bilateral ([place]) 09/08/2013  . ESRD on hemodialysis ([place]) 05/09/2013  . TIA (transient ischemic attack) 02/20/2013  . Acute blood loss anemia 10/28/2012  . Chronic combined systolic (EF 57%) and grade 2diastolic congestive heart  failure 10/28/2012  . Obstipation 10/28/2012  . GI bleed 10/27/2012  . Mass in rectum 10/27/2012  . DM (diabetes mellitus) type I controlled with renal manifestation ([place]) 09/08/2012  . LVH (left ventricular  hypertrophy)-severe concentric 06/13/2012  . Anemia due to chronic illness 06/12/2012  . Hyperlipidemia 01/30/2011  . CHOLELITHIASIS 08/01/2010  . BENIGN PROSTATIC HYPERTROPHY 08/01/2010  . CEREBROVASCULAR ACCIDENT, HX OF 08/06/2009  . Depression 03/18/2009  . Acute combined systolic and diastolic heart failure, NYHA class 2-EF 45% 03/18/2009  . NEPHROLITHIASIS, HX OF 03/18/2009  . Morbid obesity ([place]) 03/25/2007  . Essential hypertension 03/25/2007  . GERD 03/25/2007  . Chronic hepatitis C without hepatic coma ([place]) 03/25/2007    Kaitlynn Tramontana PT, DPT 08/21/2015, 9:53 PM  [facility] [address] [place], [place], [address] Phone: [phone]   Fax:  [phone]  Name: PAETON STUDER MRN: [id number] Date of Birth: 09-23-1950

## 2015-08-22 ENCOUNTER — Encounter: Payer: Self-pay | Admitting: Pharmacy Technician

## 2015-08-22 DIAGNOSIS — N186 End stage renal disease: Secondary | ICD-10-CM | POA: Diagnosis not present

## 2015-08-22 DIAGNOSIS — N2581 Secondary hyperparathyroidism of renal origin: Secondary | ICD-10-CM | POA: Diagnosis not present

## 2015-08-22 DIAGNOSIS — D631 Anemia in chronic kidney disease: Secondary | ICD-10-CM | POA: Diagnosis not present

## 2015-08-22 DIAGNOSIS — E1122 Type 2 diabetes mellitus with diabetic chronic kidney disease: Secondary | ICD-10-CM | POA: Diagnosis not present

## 2015-08-22 DIAGNOSIS — Z4931 Encounter for adequacy testing for hemodialysis: Secondary | ICD-10-CM | POA: Diagnosis not present

## 2015-08-23 ENCOUNTER — Ambulatory Visit: Payer: Medicare Other | Admitting: Physical Therapy

## 2015-08-24 DIAGNOSIS — N2581 Secondary hyperparathyroidism of renal origin: Secondary | ICD-10-CM | POA: Diagnosis not present

## 2015-08-24 DIAGNOSIS — D631 Anemia in chronic kidney disease: Secondary | ICD-10-CM | POA: Diagnosis not present

## 2015-08-24 DIAGNOSIS — N186 End stage renal disease: Secondary | ICD-10-CM | POA: Diagnosis not present

## 2015-08-24 DIAGNOSIS — Z4931 Encounter for adequacy testing for hemodialysis: Secondary | ICD-10-CM | POA: Diagnosis not present

## 2015-08-24 DIAGNOSIS — E1122 Type 2 diabetes mellitus with diabetic chronic kidney disease: Secondary | ICD-10-CM | POA: Diagnosis not present

## 2015-08-26 ENCOUNTER — Ambulatory Visit: Payer: Medicare Other | Admitting: Physical Therapy

## 2015-08-26 ENCOUNTER — Encounter: Payer: Self-pay | Admitting: Physical Therapy

## 2015-08-26 DIAGNOSIS — Z7409 Other reduced mobility: Secondary | ICD-10-CM

## 2015-08-26 DIAGNOSIS — R2991 Unspecified symptoms and signs involving the musculoskeletal system: Secondary | ICD-10-CM

## 2015-08-26 DIAGNOSIS — R5381 Other malaise: Secondary | ICD-10-CM

## 2015-08-26 DIAGNOSIS — R29818 Other symptoms and signs involving the nervous system: Secondary | ICD-10-CM | POA: Diagnosis not present

## 2015-08-26 DIAGNOSIS — R6889 Other general symptoms and signs: Secondary | ICD-10-CM

## 2015-08-26 DIAGNOSIS — R531 Weakness: Secondary | ICD-10-CM | POA: Diagnosis not present

## 2015-08-26 DIAGNOSIS — R269 Unspecified abnormalities of gait and mobility: Secondary | ICD-10-CM | POA: Diagnosis not present

## 2015-08-26 DIAGNOSIS — R2689 Other abnormalities of gait and mobility: Secondary | ICD-10-CM

## 2015-08-26 DIAGNOSIS — M79641 Pain in right hand: Secondary | ICD-10-CM

## 2015-08-26 NOTE — Therapy (Signed)
[facility] [address] [place], [place], [address] Phone: [phone]   Fax:  [phone]  Physical Therapy Treatment  Patient Details  Name: Oscar Castillo MRN: [id number] Date of Birth: 04/20/1951 Referring Provider: [name], MD  Encounter Date: 08/26/2015      PT End of Session - 08/26/15 1454    Visit Number 17   Number of Visits 26   Date for PT Re-Evaluation 09/20/15   Authorization Type Medicare Do G-Code & progress reports every 10 visits   PT Start Time 1450   PT Stop Time 1530   PT Time Calculation (min) 40 min   Equipment Utilized During Treatment Gait belt   Activity Tolerance Patient tolerated treatment well;Patient limited by pain   Behavior During Therapy [facility] for tasks assessed/performed      Past Medical History  Diagnosis Date  . ESRD on hemodialysis ([place]) 05/05/2007    ESRD due to DM/HTN. Started dialysis in November 2013.  HD TTS at [facility] on [address].  [name] BACK PAIN, LUMBAR, CHRONIC 08/06/2009  . BENIGN PROSTATIC HYPERTROPHY 08/01/2010  . CEREBROVASCULAR ACCIDENT, HX OF 08/06/2009  . CHOLELITHIASIS 08/01/2010  . CONGESTIVE HEART FAILURE 03/18/2009  . DEPRESSION 03/18/2009  . DIABETES MELLITUS, TYPE II 03/25/2007  . ERECTILE DYSFUNCTION 03/25/2007  . GERD 03/25/2007  . HEPATITIS C, HX OF 03/25/2007  . HYPERTENSION 03/25/2007  . Morbid obesity ([place]) 03/25/2007  . NEPHROLITHIASIS, HX OF 03/18/2009  . Complication of anesthesia     wife states pt had trouble waking up with his last surgery in Nov., 2014  . History of Clostridium difficile   . Hemorrhoids   . Anemia   . Antral ulcer 2014    small    Past Surgical History  Procedure Laterality Date  . Nephrectomy      partial RR  . Av fistula placement  06/14/2012    Procedure: ARTERIOVENOUS (AV) FISTULA CREATION;  Surgeon: Angelia Mould, MD;  Location: [facility] OR;  Service: Vascular;  Laterality: Left;  Left basilic vein transposition  with fistula.  . Tibia im nail insertion Left 09/09/2012    Procedure: INTRAMEDULLARY (IM) NAIL TIBIAL;  Surgeon: Johnny Bridge, MD;  Location: [facility];  Service: Orthopedics;  Laterality: Left;  left tibial nail and open reduction internal fixation left fibula fracture  . Orif fibula fracture Left 09/09/2012    Procedure: OPEN REDUCTION INTERNAL FIXATION (ORIF) FIBULA FRACTURE;  Surgeon: Johnny Bridge, MD;  Location: [facility];  Service: Orthopedics;  Laterality: Left;  . Colonoscopy N/A 10/28/2012    Procedure: COLONOSCOPY;  Surgeon: Jeryl Columbia, MD;  Location: [facility] ENDOSCOPY;  Service: Endoscopy;  Laterality: N/A;  . Esophagogastroduodenoscopy N/A 11/02/2012    Procedure: ESOPHAGOGASTRODUODENOSCOPY (EGD);  Surgeon: Cleotis Nipper, MD;  Location: [facility] ENDOSCOPY;  Service: Endoscopy;  Laterality: N/A;  . Colonoscopy N/A 11/02/2012    Procedure: COLONOSCOPY;  Surgeon: Cleotis Nipper, MD;  Location: [facility] ENDOSCOPY;  Service: Endoscopy;  Laterality: N/A;  . Colonoscopy N/A 11/03/2012    Procedure: COLONOSCOPY;  Surgeon: Cleotis Nipper, MD;  Location: [facility] ENDOSCOPY;  Service: Endoscopy;  Laterality: N/A;  . Givens capsule study N/A 11/04/2012    Procedure: GIVENS CAPSULE STUDY;  Surgeon: Cleotis Nipper, MD;  Location: [facility] ENDOSCOPY;  Service: Endoscopy;  Laterality: N/A;  . Enteroscopy N/A 11/08/2012    Procedure: ENTEROSCOPY;  Surgeon: Wonda Horner, MD;  Location: [facility] ENDOSCOPY;  Service: Endoscopy;  Laterality: N/A;  .  Amputation Left 05/12/2013    Procedure: AMPUTATION RAY;  Surgeon: Newt Minion, MD;  Location: [facility];  Service: Orthopedics;  Laterality: Left;  Left Foot 1st Ray Amputation  . Eye surgery Left     to remove scar tissue  . Amputation Left 06/09/2013    Procedure: AMPUTATION BELOW KNEE;  Surgeon: Newt Minion, MD;  Location: [facility];  Service: Orthopedics;  Laterality: Left;  Left Below Knee Amputation and removal proximal screws IM tibial nail  . Hardware removal Left 06/09/2013     Procedure: HARDWARE REMOVAL;  Surgeon: Newt Minion, MD;  Location: [facility];  Service: Orthopedics;  Laterality: Left;  Left Below Knee Amputation  and Removal proximal screws IM tibial nail  . Amputation Right 09/08/2013    Procedure: AMPUTATION BELOW KNEE;  Surgeon: Newt Minion, MD;  Location: [facility];  Service: Orthopedics;  Laterality: Right;  Right Below Knee Amputation  . Amputation Right 10/11/2013    Procedure: AMPUTATION BELOW KNEE;  Surgeon: Newt Minion, MD;  Location: [facility];  Service: Orthopedics;  Laterality: Right;  Right Below Knee Amputation Revision    There were no vitals filed for this visit.  Visit Diagnosis:  Generalized weakness  Balance problems  Impaired transfers  Impaired functional mobility and activity tolerance  Decreased functional activity tolerance  Pain, hand joint, right  Abnormality of gait  Debility      Subjective Assessment - 08/26/15 1452    Subjective No new complaints. No falls.    Patient is accompained by: Family member  spouse   Patient Stated Goals To be able to walk more & hopefully by himself with prostheses   Currently in Pain? Yes   Pain Score 3    Pain Location Hand   Pain Orientation Right;Left   Pain Descriptors / Indicators Aching;Sore;Cramping   Pain Type Chronic pain   Pain Onset More than a month ago   Pain Frequency Constant   Aggravating Factors  dialysis   Pain Relieving Factors rest           [facility] Adult PT Treatment/Exercise - 08/26/15 1455    Transfers   Sit to Stand 3: Mod assist;With upper extremity assist;With armrests;From chair/3-in-1   Sit to Stand Details Verbal cues for sequencing;Verbal cues for technique;Tactile cues for weight shifting;Tactile cues for posture;Tactile cues for sequencing;Manual facilitation for weight shifting   Stand to Sit With upper extremity assist;With armrests;To chair/3-in-1;Uncontrolled descent;3: Mod assist   Stand to Sit Details (indicate cue type and reason) Verbal  cues for precautions/safety;Verbal cues for technique;Verbal cues for sequencing;Manual facilitation for weight shifting;Tactile cues for weight shifting;Tactile cues for posture;Tactile cues for sequencing   Stand Pivot Transfers 2: Max assist  of 2 people   Stand Pivot Transfer Details (indicate cue type and reason) to turn and sit safely into chair (90* turn) x1. pt able to take 1-2 steps before needed complete assist to complete pivot to sit safely in chair.   Ambulation/Gait   Ambulation/Gait Yes   Ambulation/Gait Assistance 3: Mod assist  of two people   Ambulation/Gait Assistance Details multimodal cues on posture, bil knee extension with stance phase, for appropriate step/stride length and walker position/advancement.                                              Ambulation Distance (Feet) 18 Feet  x1, 20 x1   Assistive device Prostheses;Rolling walker   Gait Pattern Decreased step length - right;Decreased step length - left;Decreased stride length;Right flexed knee in stance;Left flexed knee in stance;Trunk flexed;Narrow base of support  at times pt taking too large of a step with swing phase   Ambulation Surface Level;Indoor   Knee/Hip Exercises: Aerobic   Nustep Level 5 x 4 extremities  X 6 minutes with cues on max knee ROM   Prosthetics   Current prosthetic wear tolerance (days/week)  7 days   Current prosthetic wear tolerance (#hours/day)  90% of awake hours, including dialysis   Residual limb condition  intact      Seated edge of mat: Fwd flex into trunk extension x 10 reps  Upper trunk rotation with reaching behind him x 10 each side Lateral elbow taps to mat, alternating sides, x 10 reps each side.         PT Short Term Goals - 08/26/15 1455    PT SHORT TERM GOAL #1   Title Patient sit to /from stand w/c to sink with moderate assist. (NEW Target Date: 08/30/2015)   Baseline 07/24/15: max assist to achieve full standing at sink   Status On-going   PT SHORT TERM GOAL  #2   Title Patient & wife demonstrate / verbalize HEP / updated exercise program for fitness center. (Target Date: 07/26/2015)   Baseline 07/24/15: doing HEP on non dialysis days, seated ones every day and [facility] on [place] per family.    Status Achieved   PT SHORT TERM GOAL #3   Title Patient ambulates 70' with RW & prostheses with moderate assist (2 people for safety) (Target Date: 07/26/2015)   Baseline 07/24/15: pt able to ambulate short distance with mod assist of 2 people, did not reach 40 feet goal   Status Partially Met   PT SHORT TERM GOAL #4   Title Patient tolerates 30 seconds of standing with RW with minA. (Target Date: 08/30/2015)   Time 1   Period Months   Status New   PT SHORT TERM GOAL #5   Title Patient ambulates 9' with RW / prostheses & turns 90* to sit in chair with armrests with moderate assist (can have 2nd person for safety). Target Date: 08/30/2015   Time 1   Period Months   Status New           PT Long Term Goals - 07/17/15 1320    PT LONG TERM GOAL #1   Title Patient and wife demonstrate proper prosthetic care. (Target Date: 09/20/2015)   Time 3   Period Months   Status On-going   PT LONG TERM GOAL #2   Title Pt & wife verbalize & demonstrate ongoing HEP / fitness plan.  (Target Date: 09/20/2015)   Time 3   Period Months   Status On-going   PT LONG TERM GOAL #3   Title stand pivot transfers with rolling walker & prostheses with supervision.  (Target Date: 09/20/2015)   Time 3   Period Months   Status On-going   PT LONG TERM GOAL #4   Title adjusts clothes & reaches 2" anteriorly while standing with RW support with minimal assist.  (Target Date: 09/20/2015)   Time 3   Period Months   Status On-going   PT LONG TERM GOAL #5   Title ambulate 23' with rolling walker & prostheses with minimal assist.  (Target Date: 09/20/2015)   Time 3   Period Months   Status  On-going           Plan - 08/26/15 1454    Clinical Impression Statement Pt  continues to be limited by hand pain with movement and weight bearing. Increased episodes of knee instability with second gait trial. Pt making slow progress toward goals.   Pt will benefit from skilled therapeutic intervention in order to improve on the following deficits Abnormal gait;Decreased activity tolerance;Decreased balance;Decreased endurance;Decreased mobility;Decreased range of motion;Decreased strength;Postural dysfunction;Prosthetic Dependency   Rehab Potential Good   PT Frequency 2x / week   PT Duration Other (comment)  13 weeks (90 days)   PT Treatment/Interventions ADLs/Self Care Home Management;DME Instruction;Gait training;Stair training;Functional mobility training;Therapeutic activities;Therapeutic exercise;Balance training;Neuromuscular re-education;Patient/family education;Prosthetic Training   PT Next Visit Plan assess goals, schedule if warranted   Consulted and Agree with Plan of Care Patient;Family member/caregiver   Family Member Consulted wife        Problem List Patient Active Problem List   Diagnosis Date Noted  . Diarrhea 08/14/2014  . Dehydration 10/02/2013  . Fever 10/02/2013  . Altered mental status 10/01/2013  . Encephalopathy, toxic 10/01/2013  . Encephalopathy, metabolic 47/34/0370  . CVA (cerebral vascular accident) ([place]) 09/28/2013  . FTT (failure to thrive) in adult 09/28/2013  . Acute confusional state 09/28/2013  . Ulcer of sacral region, stage 3 ([place]) 09/26/2013  . S/P BKA (below knee amputation) bilateral ([place]) 09/08/2013  . ESRD on hemodialysis ([place]) 05/09/2013  . TIA (transient ischemic attack) 02/20/2013  . Acute blood loss anemia 10/28/2012  . Chronic combined systolic (EF 96%) and grade 2diastolic congestive heart failure 10/28/2012  . Obstipation 10/28/2012  . GI bleed 10/27/2012  . Mass in rectum 10/27/2012  . DM (diabetes mellitus) type I controlled with renal manifestation ([place]) 09/08/2012  . LVH (left ventricular  hypertrophy)-severe concentric 06/13/2012  . Anemia due to chronic illness 06/12/2012  . Hyperlipidemia 01/30/2011  . CHOLELITHIASIS 08/01/2010  . BENIGN PROSTATIC HYPERTROPHY 08/01/2010  . CEREBROVASCULAR ACCIDENT, HX OF 08/06/2009  . Depression 03/18/2009  . Acute combined systolic and diastolic heart failure, NYHA class 2-EF 45% 03/18/2009  . NEPHROLITHIASIS, HX OF 03/18/2009  . Morbid obesity ([name]) 03/25/2007  . Essential hypertension 03/25/2007  . GERD 03/25/2007  . Chronic hepatitis C without hepatic coma ([place]) 03/25/2007    Willow Ora 08/27/2015, 10:12 AM  Willow Ora, PTA, [facility] [address] [place], [place] [address] [phone] 08/27/2015, 10:12 AM   Name: RASHAAD HALLSTROM MRN: [id number] Date of Birth: 03/16/1951

## 2015-08-27 ENCOUNTER — Ambulatory Visit: Payer: Medicare Other | Admitting: Physical Therapy

## 2015-08-27 DIAGNOSIS — N2581 Secondary hyperparathyroidism of renal origin: Secondary | ICD-10-CM | POA: Diagnosis not present

## 2015-08-27 DIAGNOSIS — E1122 Type 2 diabetes mellitus with diabetic chronic kidney disease: Secondary | ICD-10-CM | POA: Diagnosis not present

## 2015-08-27 DIAGNOSIS — D631 Anemia in chronic kidney disease: Secondary | ICD-10-CM | POA: Diagnosis not present

## 2015-08-27 DIAGNOSIS — Z4931 Encounter for adequacy testing for hemodialysis: Secondary | ICD-10-CM | POA: Diagnosis not present

## 2015-08-27 DIAGNOSIS — Z992 Dependence on renal dialysis: Secondary | ICD-10-CM | POA: Diagnosis not present

## 2015-08-27 DIAGNOSIS — N186 End stage renal disease: Secondary | ICD-10-CM | POA: Diagnosis not present

## 2015-08-28 ENCOUNTER — Ambulatory Visit: Payer: Medicare Other | Attending: Internal Medicine | Admitting: Physical Therapy

## 2015-08-28 ENCOUNTER — Encounter: Payer: Self-pay | Admitting: Physical Therapy

## 2015-08-28 DIAGNOSIS — R269 Unspecified abnormalities of gait and mobility: Secondary | ICD-10-CM | POA: Insufficient documentation

## 2015-08-28 DIAGNOSIS — R5381 Other malaise: Secondary | ICD-10-CM | POA: Diagnosis not present

## 2015-08-28 DIAGNOSIS — M79641 Pain in right hand: Secondary | ICD-10-CM | POA: Insufficient documentation

## 2015-08-28 DIAGNOSIS — R531 Weakness: Secondary | ICD-10-CM | POA: Diagnosis not present

## 2015-08-28 DIAGNOSIS — R29818 Other symptoms and signs involving the nervous system: Secondary | ICD-10-CM | POA: Diagnosis not present

## 2015-08-28 DIAGNOSIS — M24669 Ankylosis, unspecified knee: Secondary | ICD-10-CM | POA: Diagnosis not present

## 2015-08-28 DIAGNOSIS — R6889 Other general symptoms and signs: Secondary | ICD-10-CM | POA: Insufficient documentation

## 2015-08-28 DIAGNOSIS — Z7409 Other reduced mobility: Secondary | ICD-10-CM | POA: Diagnosis not present

## 2015-08-28 DIAGNOSIS — Z89512 Acquired absence of left leg below knee: Secondary | ICD-10-CM | POA: Insufficient documentation

## 2015-08-28 DIAGNOSIS — Z89511 Acquired absence of right leg below knee: Secondary | ICD-10-CM | POA: Diagnosis not present

## 2015-08-28 DIAGNOSIS — R2991 Unspecified symptoms and signs involving the musculoskeletal system: Secondary | ICD-10-CM | POA: Insufficient documentation

## 2015-08-28 DIAGNOSIS — R2689 Other abnormalities of gait and mobility: Secondary | ICD-10-CM

## 2015-08-29 DIAGNOSIS — E1122 Type 2 diabetes mellitus with diabetic chronic kidney disease: Secondary | ICD-10-CM | POA: Diagnosis not present

## 2015-08-29 DIAGNOSIS — Z23 Encounter for immunization: Secondary | ICD-10-CM | POA: Diagnosis not present

## 2015-08-29 DIAGNOSIS — N186 End stage renal disease: Secondary | ICD-10-CM | POA: Diagnosis not present

## 2015-08-29 DIAGNOSIS — N2581 Secondary hyperparathyroidism of renal origin: Secondary | ICD-10-CM | POA: Diagnosis not present

## 2015-08-29 NOTE — Therapy (Signed)
[facility] [address] [place], [place], [address] Phone: [phone]   Fax:  [phone]  Physical Therapy Treatment  Patient Details  Name: Oscar Castillo MRN: [id number] Date of Birth: August 30, 1950 Referring Provider: [name], MD  Encounter Date: 08/28/2015      PT End of Session - 08/28/15 1315    Visit Number 18   Number of Visits 26   Date for PT Re-Evaluation 09/20/15   Authorization Type Medicare Do G-Code & progress reports every 10 visits   PT Start Time 7893   PT Stop Time 1315   PT Time Calculation (min) 40 min   Equipment Utilized During Treatment Gait belt   Activity Tolerance Patient tolerated treatment well;Patient limited by pain   Behavior During Therapy [facility] for tasks assessed/performed      Past Medical History  Diagnosis Date  . ESRD on hemodialysis ([place]) 05/05/2007    ESRD due to DM/HTN. Started dialysis in November 2013.  HD TTS at [facility] on [address].  [name] BACK PAIN, LUMBAR, CHRONIC 08/06/2009  . BENIGN PROSTATIC HYPERTROPHY 08/01/2010  . CEREBROVASCULAR ACCIDENT, HX OF 08/06/2009  . CHOLELITHIASIS 08/01/2010  . CONGESTIVE HEART FAILURE 03/18/2009  . DEPRESSION 03/18/2009  . DIABETES MELLITUS, TYPE II 03/25/2007  . ERECTILE DYSFUNCTION 03/25/2007  . GERD 03/25/2007  . HEPATITIS C, HX OF 03/25/2007  . HYPERTENSION 03/25/2007  . Morbid obesity ([place]) 03/25/2007  . NEPHROLITHIASIS, HX OF 03/18/2009  . Complication of anesthesia     wife states pt had trouble waking up with his last surgery in Nov., 2014  . History of Clostridium difficile   . Hemorrhoids   . Anemia   . Antral ulcer 2014    small    Past Surgical History  Procedure Laterality Date  . Nephrectomy      partial RR  . Av fistula placement  06/14/2012    Procedure: ARTERIOVENOUS (AV) FISTULA CREATION;  Surgeon: Angelia Mould, MD;  Location: [facility] OR;  Service: Vascular;  Laterality: Left;  Left basilic vein transposition  with fistula.  . Tibia im nail insertion Left 09/09/2012    Procedure: INTRAMEDULLARY (IM) NAIL TIBIAL;  Surgeon: Johnny Bridge, MD;  Location: [place];  Service: Orthopedics;  Laterality: Left;  left tibial nail and open reduction internal fixation left fibula fracture  . Orif fibula fracture Left 09/09/2012    Procedure: OPEN REDUCTION INTERNAL FIXATION (ORIF) FIBULA FRACTURE;  Surgeon: Johnny Bridge, MD;  Location: [place];  Service: Orthopedics;  Laterality: Left;  . Colonoscopy N/A 10/28/2012    Procedure: COLONOSCOPY;  Surgeon: Jeryl Columbia, MD;  Location: [facility] ENDOSCOPY;  Service: Endoscopy;  Laterality: N/A;  . Esophagogastroduodenoscopy N/A 11/02/2012    Procedure: ESOPHAGOGASTRODUODENOSCOPY (EGD);  Surgeon: Cleotis Nipper, MD;  Location: [facility] ENDOSCOPY;  Service: Endoscopy;  Laterality: N/A;  . Colonoscopy N/A 11/02/2012    Procedure: COLONOSCOPY;  Surgeon: Cleotis Nipper, MD;  Location[facility]COPY;  Service: Endoscopy;  Laterality: N/A;  . Colonoscopy N/A 11/03/2012    Procedure: COLONOSCOPY;  Surgeon: Cleotis Nipper, MD;  Location: [facility] ENDOSCOPY;  Service: Endoscopy;  Laterality: N/A;  . Givens capsule study N/A 11/04/2012    Procedure: GIVENS CAPSULE STUDY;  Surgeon: Cleotis Nipper, MD;  Location: [facility] ENDOSCOPY;  Service: Endoscopy;  Laterality: N/A;  . Enteroscopy N/A 11/08/2012    Procedure: ENTEROSCOPY;  Surgeon: Wonda Horner, MD;  Location: [facility] ENDOSCOPY;  Service: Endoscopy;  Laterality: N/A;  .  Amputation Left 05/12/2013    Procedure: AMPUTATION RAY;  Surgeon: Newt Minion, MD;  Location: [place];  Service: Orthopedics;  Laterality: Left;  Left Foot 1st Ray Amputation  . Eye surgery Left     to remove scar tissue  . Amputation Left 06/09/2013    Procedure: AMPUTATION BELOW KNEE;  Surgeon: Newt Minion, MD;  Location: [place];  Service: Orthopedics;  Laterality: Left;  Left Below Knee Amputation and removal proximal screws IM tibial nail  . Hardware removal Left 06/09/2013     Procedure: HARDWARE REMOVAL;  Surgeon: Newt Minion, MD;  Location: [place];  Service: Orthopedics;  Laterality: Left;  Left Below Knee Amputation  and Removal proximal screws IM tibial nail  . Amputation Right 09/08/2013    Procedure: AMPUTATION BELOW KNEE;  Surgeon: Newt Minion, MD;  Location: [place];  Service: Orthopedics;  Laterality: Right;  Right Below Knee Amputation  . Amputation Right 10/11/2013    Procedure: AMPUTATION BELOW KNEE;  Surgeon: Newt Minion, MD;  Location: [place];  Service: Orthopedics;  Laterality: Right;  Right Below Knee Amputation Revision    There were no vitals filed for this visit.  Visit Diagnosis:  Generalized weakness  Balance problems  Impaired transfers  Impaired functional mobility and activity tolerance  Pain, hand joint, right  Abnormality of gait  Decreased range of knee movement, unspecified laterality  Status post bilateral below knee amputation (HCC)      Subjective Assessment - 08/28/15 1241    Subjective (p) Right wrist is a little better. New medication should take ~2 weeks and should have more energy.    Patient is accompained by: (p) Family member   Currently in Pain? (p) Yes   Pain Score (p) 2    Pain Location (p) Hand   Pain Orientation (p) Right;Left   Pain Descriptors / Indicators (p) Aching;Sore;Cramping   Pain Type (p) Chronic pain   Pain Onset (p) More than a month ago   Pain Frequency (p) Constant   Aggravating Factors  (p) dialysis   Pain Relieving Factors (p) rest                         [facility] Adult PT Treatment/Exercise - 08/28/15 1235    Transfers   Sit to Stand 3: Mod assist;With upper extremity assist;With armrests;From chair/3-in-1   Sit to Stand Details Verbal cues for sequencing;Verbal cues for technique;Tactile cues for weight shifting;Tactile cues for posture;Tactile cues for sequencing;Manual facilitation for weight shifting   Stand to Sit 4: Min assist;With armrests;To chair/3-in-1    Stand to Sit Details (indicate cue type and reason) Verbal cues for precautions/safety;Verbal cues for technique;Verbal cues for sequencing;Manual facilitation for weight shifting;Tactile cues for weight shifting;Tactile cues for posture;Tactile cues for sequencing   Ambulation/Gait   Ambulation/Gait Yes   Ambulation/Gait Assistance 3: Mod assist;Other (comment)  2 person assist   Ambulation/Gait Assistance Details verbal cues on upright posture especially in turns to position to sit. Manual cues to extend knees & back for upright posture.    Ambulation Distance (Feet) 15 Feet  15' X 2   Assistive device Prostheses;Rolling walker   Gait Pattern Decreased step length - right;Decreased step length - left;Decreased stride length;Right flexed knee in stance;Left flexed knee in stance;Trunk flexed;Narrow base of support   Ambulation Surface Indoor;Level   Balance   Balance Assessed Yes   Static Standing Balance   Static Standing - Balance Support Bilateral upper  extremity supported   Static Standing - Level of Assistance 4: Min assist   Static Standing - Comment/# of Minutes 60 seconds   Knee/Hip Exercises: Machines for Strengthening   Total Gym Leg Press 60# BLE 10 reps; 30# single LEs 5 reps each; 50# BLE 10 reps.   Prosthetics   Current prosthetic wear tolerance (days/week)  7 days   Current prosthetic wear tolerance (#hours/day)  90% of awake hours, including dialysis   Residual limb condition  intact                 PT Education - 08/28/15 1300    Education provided Yes   Education Details use of leg press & recumbent stepper at fitness center 1-2 days/week. He does not need personal trainer to use these 2 machines with wife assisting set-up.    Person(s) Educated Patient;Spouse   Methods Explanation;Demonstration;Verbal cues   Comprehension Verbalized understanding          PT Short Term Goals - 08/28/15 1315    PT SHORT TERM GOAL #1   Title Patient sit to /from stand  w/c to sink with moderate assist. (NEW Target Date: 08/30/2015)   Baseline MET 08/28/2015 to RW   Status Achieved   PT SHORT TERM GOAL #2   Title Patient & wife demonstrate / verbalize HEP / updated exercise program for fitness center. (Target Date: 07/26/2015)   Baseline 07/24/15: doing HEP on non dialysis days, seated ones every day and [facility] on [address] per family.    Status Achieved   PT SHORT TERM GOAL #3   Title Patient ambulates 1' with RW & prostheses with moderate assist (2 people for safety) (Target Date: 07/26/2015)   Baseline 07/24/15: pt able to ambulate short distance with mod assist of 2 people, did not reach 40 feet goal   Status Partially Met   PT SHORT TERM GOAL #4   Title Patient tolerates 30 seconds of standing with RW with minA. (Target Date: 08/30/2015)   Baseline MET 08/28/2015    Time 1   Period Months   Status Achieved   PT SHORT TERM GOAL #5   Title Patient ambulates 72' with RW / prostheses & turns 90* to sit in chair with armrests with moderate assist (can have 2nd person for safety). Target Date: 08/30/2015   Baseline Partially MET 08/28/2015 Patient ambulates 15' with RW /prostheses & turns 90* to sit in chair with armrests with moderate assist (2nd person required for assist)   Time 1   Period Months   Status Partially Met           PT Long Term Goals - 07/17/15 1320    PT LONG TERM GOAL #1   Title Patient and wife demonstrate proper prosthetic care. (Target Date: 09/20/2015)   Time 3   Period Months   Status On-going   PT LONG TERM GOAL #2   Title Pt & wife verbalize & demonstrate ongoing HEP / fitness plan.  (Target Date: 09/20/2015)   Time 3   Period Months   Status On-going   PT LONG TERM GOAL #3   Title stand pivot transfers with rolling walker & prostheses with supervision.  (Target Date: 09/20/2015)   Time 3   Period Months   Status On-going   PT LONG TERM GOAL #4   Title adjusts clothes & reaches 2" anteriorly while standing with RW  support with minimal assist.  (Target Date: 09/20/2015)   Time 3   Period Months  Status On-going   PT LONG TERM GOAL #5   Title ambulate 59' with rolling walker & prostheses with minimal assist.  (Target Date: 09/20/2015)   Time 3   Period Months   Status On-going               Plan - 08/28/15 1230    Clinical Impression Statement Patient met or partially met STGs. Patient's pain in right wrist improved enough to enable support with standing & gait. Patient verbalizes understanding of PT recommmendation for exercise outside of PT.    Pt will benefit from skilled therapeutic intervention in order to improve on the following deficits Abnormal gait;Decreased activity tolerance;Decreased balance;Decreased endurance;Decreased mobility;Decreased range of motion;Decreased strength;Postural dysfunction;Prosthetic Dependency   Rehab Potential Good   PT Frequency 2x / week   PT Duration Other (comment)  13 weeks (90 days)   PT Treatment/Interventions ADLs/Self Care Home Management;DME Instruction;Gait training;Stair training;Functional mobility training;Therapeutic activities;Therapeutic exercise;Balance training;Neuromuscular re-education;Patient/family education;Prosthetic Training   PT Next Visit Plan continue towards LTGs   Consulted and Agree with Plan of Care Patient;Family member/caregiver   Family Member Consulted wife        Problem List Patient Active Problem List   Diagnosis Date Noted  . Diarrhea 08/14/2014  . Dehydration 10/02/2013  . Fever 10/02/2013  . Altered mental status 10/01/2013  . Encephalopathy, toxic 10/01/2013  . Encephalopathy, metabolic 71/24/5809  . CVA (cerebral vascular accident) ([place]) 09/28/2013  . FTT (failure to thrive) in adult 09/28/2013  . Acute confusional state 09/28/2013  . Ulcer of sacral region, stage 3 ([place]) 09/26/2013  . S/P BKA (below knee amputation) bilateral ([place]) 09/08/2013  . ESRD on hemodialysis ([place]) 05/09/2013  . TIA  (transient ischemic attack) 02/20/2013  . Acute blood loss anemia 10/28/2012  . Chronic combined systolic (EF 98%) and grade 2diastolic congestive heart failure 10/28/2012  . Obstipation 10/28/2012  . GI bleed 10/27/2012  . Mass in rectum 10/27/2012  . DM (diabetes mellitus) type I controlled with renal manifestation ([place]) 09/08/2012  . LVH (left ventricular hypertrophy)-severe concentric 06/13/2012  . Anemia due to chronic illness 06/12/2012  . Hyperlipidemia 01/30/2011  . CHOLELITHIASIS 08/01/2010  . BENIGN PROSTATIC HYPERTROPHY 08/01/2010  . CEREBROVASCULAR ACCIDENT, HX OF 08/06/2009  . Depression 03/18/2009  . Acute combined systolic and diastolic heart failure, NYHA class 2-EF 45% 03/18/2009  . NEPHROLITHIASIS, HX OF 03/18/2009  . Morbid obesity ([place]) 03/25/2007  . Essential hypertension 03/25/2007  . GERD 03/25/2007  . Chronic hepatitis C without hepatic coma ([place]) 03/25/2007    Laszlo Ellerby PT, DPT 08/29/2015, 1:12 PM  [facility] [address] [place], [place], [address] Phone: [phone]   Fax:  [phone]  Name: Oscar Castillo MRN: [id number] Date of Birth: Jul 03, 1951

## 2015-08-31 DIAGNOSIS — N186 End stage renal disease: Secondary | ICD-10-CM | POA: Diagnosis not present

## 2015-08-31 DIAGNOSIS — N2581 Secondary hyperparathyroidism of renal origin: Secondary | ICD-10-CM | POA: Diagnosis not present

## 2015-08-31 DIAGNOSIS — E1122 Type 2 diabetes mellitus with diabetic chronic kidney disease: Secondary | ICD-10-CM | POA: Diagnosis not present

## 2015-08-31 DIAGNOSIS — Z23 Encounter for immunization: Secondary | ICD-10-CM | POA: Diagnosis not present

## 2015-09-03 DIAGNOSIS — N186 End stage renal disease: Secondary | ICD-10-CM | POA: Diagnosis not present

## 2015-09-03 DIAGNOSIS — E1122 Type 2 diabetes mellitus with diabetic chronic kidney disease: Secondary | ICD-10-CM | POA: Diagnosis not present

## 2015-09-03 DIAGNOSIS — Z23 Encounter for immunization: Secondary | ICD-10-CM | POA: Diagnosis not present

## 2015-09-03 DIAGNOSIS — N2581 Secondary hyperparathyroidism of renal origin: Secondary | ICD-10-CM | POA: Diagnosis not present

## 2015-09-04 ENCOUNTER — Ambulatory Visit: Payer: Medicare Other | Admitting: Physical Therapy

## 2015-09-04 ENCOUNTER — Encounter: Payer: Self-pay | Admitting: Physical Therapy

## 2015-09-04 DIAGNOSIS — Z7409 Other reduced mobility: Secondary | ICD-10-CM | POA: Diagnosis not present

## 2015-09-04 DIAGNOSIS — R2689 Other abnormalities of gait and mobility: Secondary | ICD-10-CM

## 2015-09-04 DIAGNOSIS — R531 Weakness: Secondary | ICD-10-CM

## 2015-09-04 DIAGNOSIS — R2991 Unspecified symptoms and signs involving the musculoskeletal system: Secondary | ICD-10-CM

## 2015-09-04 DIAGNOSIS — R5381 Other malaise: Secondary | ICD-10-CM

## 2015-09-04 DIAGNOSIS — R6889 Other general symptoms and signs: Secondary | ICD-10-CM

## 2015-09-04 DIAGNOSIS — R29818 Other symptoms and signs involving the nervous system: Secondary | ICD-10-CM | POA: Diagnosis not present

## 2015-09-04 DIAGNOSIS — R269 Unspecified abnormalities of gait and mobility: Secondary | ICD-10-CM | POA: Diagnosis not present

## 2015-09-04 DIAGNOSIS — M79641 Pain in right hand: Secondary | ICD-10-CM

## 2015-09-04 DIAGNOSIS — M24669 Ankylosis, unspecified knee: Secondary | ICD-10-CM

## 2015-09-04 NOTE — Therapy (Signed)
[facility] [address] [place], [place], [address] Phone: [phone]   Fax:  [phone]  Physical Therapy Treatment  Patient Details  Name: Oscar Castillo MRN: [id number] Date of Birth: [birth year] Referring Provider: [name], MD  Encounter Date: 09/04/2015      PT End of Session - 09/04/15 1236    Visit Number 19   Number of Visits 26   Date for PT Re-Evaluation 09/20/15   Authorization Type Medicare Do G-Code & progress reports every 10 visits   PT Start Time 1232   PT Stop Time 1315   PT Time Calculation (min) 43 min   Equipment Utilized During Treatment Gait belt   Activity Tolerance Patient tolerated treatment well;Patient limited by pain   Behavior During Therapy [facility] for tasks assessed/performed      Past Medical History  Diagnosis Date  . ESRD on hemodialysis ([place]) 05/05/2007    ESRD due to DM/HTN. Started dialysis in November 2013.  HD TTS at [facility] on [address].  [name] BACK PAIN, LUMBAR, CHRONIC 08/06/2009  . BENIGN PROSTATIC HYPERTROPHY 08/01/2010  . CEREBROVASCULAR ACCIDENT, HX OF 08/06/2009  . CHOLELITHIASIS 08/01/2010  . CONGESTIVE HEART FAILURE 03/18/2009  . DEPRESSION 03/18/2009  . DIABETES MELLITUS, TYPE II 03/25/2007  . ERECTILE DYSFUNCTION 03/25/2007  . GERD 03/25/2007  . HEPATITIS C, HX OF 03/25/2007  . HYPERTENSION 03/25/2007  . Morbid obesity ([place]) 03/25/2007  . NEPHROLITHIASIS, HX OF 03/18/2009  . Complication of anesthesia     wife states pt had trouble waking up with his last surgery in Nov., 2014  . History of Clostridium difficile   . Hemorrhoids   . Anemia   . Antral ulcer 2014    small    Past Surgical History  Procedure Laterality Date  . Nephrectomy      partial RR  . Av fistula placement  06/14/2012    Procedure: ARTERIOVENOUS (AV) FISTULA CREATION;  Surgeon: Angelia Mould, MD;  Location: [facility] OR;  Service: Vascular;  Laterality: Left;  Left basilic vein transposition  with fistula.  . Tibia im nail insertion Left 09/09/2012    Procedure: INTRAMEDULLARY (IM) NAIL TIBIAL;  Surgeon: Johnny Bridge, MD;  Location: [facility];  Service: Orthopedics;  Laterality: Left;  left tibial nail and open reduction internal fixation left fibula fracture  . Orif fibula fracture Left 09/09/2012    Procedure: OPEN REDUCTION INTERNAL FIXATION (ORIF) FIBULA FRACTURE;  Surgeon: Johnny Bridge, MD;  Location: [facility];  Service: Orthopedics;  Laterality: Left;  . Colonoscopy N/A 10/28/2012    Procedure: COLONOSCOPY;  Surgeon: Jeryl [place], MD;  Location[facility] ENDOSCOPY;  Service: Endoscopy;  Laterality: N/A;  . Esophagogastroduodenoscopy N/A 11/02/2012    Procedure: ESOPHAGOGASTRODUODENOSCOPY (EGD);  Surgeon: Cleotis Nipper, MD;  Location: [facility] ENDOSCOPY;  Service: Endoscopy;  Laterality: N/A;  . Colonoscopy N/A 11/02/2012    Procedure: COLONOSCOPY;  Surgeon: Cleotis Nipper, MD;  Location: [facility] ENDOSCOPY;  Service: Endoscopy;  Laterality: N/A;  . Colonoscopy N/A 11/03/2012    Procedure: COLONOSCOPY;  Surgeon: Cleotis Nipper, MD;  Location: [facility] ENDOSCOPY;  Service: Endoscopy;  Laterality: N/A;  . Givens capsule study N/A 11/04/2012    Procedure: GIVENS CAPSULE STUDY;  Surgeon: Cleotis Nipper, MD;  Location: [facility] ENDOSCOPY;  Service: Endoscopy;  Laterality: N/A;  . Enteroscopy N/A 11/08/2012    Procedure: ENTEROSCOPY;  Surgeon: Wonda Horner, MD;  Location: [facility] ENDOSCOPY;  Service: Endoscopy;  Laterality: N/A;  .  Amputation Left 05/12/2013    Procedure: AMPUTATION RAY;  Surgeon: Newt Minion, MD;  Location: [place];  Service: Orthopedics;  Laterality: Left;  Left Foot 1st Ray Amputation  . Eye surgery Left     to remove scar tissue  . Amputation Left 06/09/2013    Procedure: AMPUTATION BELOW KNEE;  Surgeon: Newt Minion, MD;  Location: [facility];  Service: Orthopedics;  Laterality: Left;  Left Below Knee Amputation and removal proximal screws IM tibial nail  . Hardware removal Left 06/09/2013     Procedure: HARDWARE REMOVAL;  Surgeon: Newt Minion, MD;  Location: [facility];  Service: Orthopedics;  Laterality: Left;  Left Below Knee Amputation  and Removal proximal screws IM tibial nail  . Amputation Right 09/08/2013    Procedure: AMPUTATION BELOW KNEE;  Surgeon: Newt Minion, MD;  Location: [place];  Service: Orthopedics;  Laterality: Right;  Right Below Knee Amputation  . Amputation Right 10/11/2013    Procedure: AMPUTATION BELOW KNEE;  Surgeon: Newt Minion, MD;  Location: [place];  Service: Orthopedics;  Laterality: Right;  Right Below Knee Amputation Revision    There were no vitals filed for this visit.  Visit Diagnosis:  Generalized weakness  Balance problems  Impaired transfers  Impaired functional mobility and activity tolerance  Pain, hand joint, right  Abnormality of gait  Decreased range of knee movement, unspecified laterality  Debility  Decreased functional activity tolerance      Subjective Assessment - 09/04/15 1235    Subjective No new complaints. No falls. Did make it to [facility] this week, however it was busy and they were not able to do a whole lot per pt report.   Patient is accompained by: Family member  spouse   Patient Stated Goals To be able to walk more & hopefully by himself with prostheses   Currently in Pain? Yes   Pain Score 3    Pain Location Hand   Pain Orientation Right;Left   Pain Descriptors / Indicators Sore;Cramping   Pain Type Chronic pain   Pain Radiating Towards up arms   Pain Onset More than a month ago   Pain Frequency Constant   Aggravating Factors  dialysis   Pain Relieving Factors rest            [facility] Adult PT Treatment/Exercise - 09/04/15 1237    Transfers   Sit to Stand 3: Mod assist;With upper extremity assist;With armrests;From chair/3-in-1   Stand to Sit 3: Mod assist;With upper extremity assist;With armrests;To chair/3-in-1   Stand Pivot Transfers 1: +2 Total assist  of 2 people   Stand Pivot  Transfer Details (indicate cue type and reason) to turn and sit safety, pt's knees buckled mid way through turn, total assist to complete turn and sit safety into chair. less assistance needed with wheelchair<>mat transfer with RW with cues on posture, knee extension with stance, walker management and to step by step instruction on sequencing.                                Ambulation/Gait   Ambulation/Gait Yes   Ambulation/Gait Assistance 3: Mod assist;Other (comment)  2 person assist   Ambulation Distance (Feet) 15 Feet  x1, 32 x1   Assistive device Prostheses;Rolling walker   Gait Pattern Decreased step length - right;Decreased step length - left;Decreased stride length;Right flexed knee in stance;Left flexed knee in stance;Trunk flexed;Narrow base of support  Ambulation Surface Level;Indoor   Prosthetics   Current prosthetic wear tolerance (days/week)  7 days   Current prosthetic wear tolerance (#hours/day)  90% of awake hours, including dialysis   Residual limb condition  intact      seated edge of mat Curl ups with inverted chair behind him, 2 sets of 10 reps Rows (scapular retraction) with red band 2 sets of 10 reps with 5 sec holds each. Upper trunk rotation with reaching behind him on mat x 10 reps each side          PT Short Term Goals - 08/28/15 1315    PT SHORT TERM GOAL #1   Title Patient sit to /from stand w/c to sink with moderate assist. (NEW Target Date: 08/30/2015)   Baseline MET 08/28/2015 to RW   Status Achieved   PT SHORT TERM GOAL #2   Title Patient & wife demonstrate / verbalize HEP / updated exercise program for fitness center. (Target Date: 07/26/2015)   Baseline 07/24/15: doing HEP on non dialysis days, seated ones every day and [facility] on [place] per family.    Status Achieved   PT SHORT TERM GOAL #3   Title Patient ambulates 47' with RW & prostheses with moderate assist (2 people for safety) (Target Date: 07/26/2015)   Baseline 07/24/15: pt able to  ambulate short distance with mod assist of 2 people, did not reach 40 feet goal   Status Partially Met   PT SHORT TERM GOAL #4   Title Patient tolerates 30 seconds of standing with RW with minA. (Target Date: 08/30/2015)   Baseline MET 08/28/2015    Time 1   Period Months   Status Achieved   PT SHORT TERM GOAL #5   Title Patient ambulates 2' with RW / prostheses & turns 90* to sit in chair with armrests with moderate assist (can have 2nd person for safety). Target Date: 08/30/2015   Baseline Partially MET 08/28/2015 Patient ambulates 15' with RW /prostheses & turns 90* to sit in chair with armrests with moderate assist (2nd person required for assist)   Time 1   Period Months   Status Partially Met           PT Long Term Goals - 07/17/15 1320    PT LONG TERM GOAL #1   Title Patient and wife demonstrate proper prosthetic care. (Target Date: 09/20/2015)   Time 3   Period Months   Status On-going   PT LONG TERM GOAL #2   Title Pt & wife verbalize & demonstrate ongoing HEP / fitness plan.  (Target Date: 09/20/2015)   Time 3   Period Months   Status On-going   PT LONG TERM GOAL #3   Title stand pivot transfers with rolling walker & prostheses with supervision.  (Target Date: 09/20/2015)   Time 3   Period Months   Status On-going   PT LONG TERM GOAL #4   Title adjusts clothes & reaches 2" anteriorly while standing with RW support with minimal assist.  (Target Date: 09/20/2015)   Time 3   Period Months   Status On-going   PT LONG TERM GOAL #5   Title ambulate 39' with rolling walker & prostheses with minimal assist.  (Target Date: 09/20/2015)   Time 3   Period Months   Status On-going            Plan - 09/04/15 1236    Clinical Impression Statement Pt with increased gait distance today with walker/prostheses. Continues to  have increased difficulty with 90 degree turns to chairs when walking, however did better with chair<>wheelchair transfers today. Pt is making slow progress  toward goals.   Pt will benefit from skilled therapeutic intervention in order to improve on the following deficits Abnormal gait;Decreased activity tolerance;Decreased balance;Decreased endurance;Decreased mobility;Decreased range of motion;Decreased strength;Postural dysfunction;Prosthetic Dependency   Rehab Potential Good   PT Frequency 2x / week   PT Duration Other (comment)  13 weeks (90 days)   PT Treatment/Interventions ADLs/Self Care Home Management;DME Instruction;Gait training;Stair training;Functional mobility training;Therapeutic activities;Therapeutic exercise;Balance training;Neuromuscular re-education;Patient/family education;Prosthetic Training   PT Next Visit Plan continue towards LTGs   Consulted and Agree with Plan of Care Patient;Family member/caregiver   Family Member Consulted wife        Problem List Patient Active Problem List   Diagnosis Date Noted  . Diarrhea 08/14/2014  . Dehydration 10/02/2013  . Fever 10/02/2013  . Altered mental status 10/01/2013  . Encephalopathy, toxic 10/01/2013  . Encephalopathy, metabolic 80/88/1103  . CVA (cerebral vascular accident) ([place]) 09/28/2013  . FTT (failure to thrive) in adult 09/28/2013  . Acute confusional state 09/28/2013  . Ulcer of sacral region, stage 3 ([place]) 09/26/2013  . S/P BKA (below knee amputation) bilateral ([place]) 09/08/2013  . ESRD on hemodialysis ([place]) 05/09/2013  . TIA (transient ischemic attack) 02/20/2013  . Acute blood loss anemia 10/28/2012  . Chronic combined systolic (EF 15%) and grade 2diastolic congestive heart failure 10/28/2012  . Obstipation 10/28/2012  . GI bleed 10/27/2012  . Mass in rectum 10/27/2012  . DM (diabetes mellitus) type I controlled with renal manifestation ([place]) 09/08/2012  . LVH (left ventricular hypertrophy)-severe concentric 06/13/2012  . Anemia due to chronic illness 06/12/2012  . Hyperlipidemia 01/30/2011  . CHOLELITHIASIS 08/01/2010  . BENIGN PROSTATIC HYPERTROPHY  08/01/2010  . CEREBROVASCULAR ACCIDENT, HX OF 08/06/2009  . Depression 03/18/2009  . Acute combined systolic and diastolic heart failure, NYHA class 2-EF 45% 03/18/2009  . NEPHROLITHIASIS, HX OF 03/18/2009  . Morbid obesity ([place]) 03/25/2007  . Essential hypertension 03/25/2007  . GERD 03/25/2007  . Chronic hepatitis C without hepatic coma ([place]) 03/25/2007    Willow Ora 09/05/2015, 12:48 PM  Willow Ora, PTA, [facility] [address], [place], [place] [address] [phone] 09/05/2015, 12:49 PM  Name: Oscar Castillo MRN: [id number] Date of Birth: 02/17/1951

## 2015-09-05 DIAGNOSIS — N186 End stage renal disease: Secondary | ICD-10-CM | POA: Diagnosis not present

## 2015-09-05 DIAGNOSIS — Z23 Encounter for immunization: Secondary | ICD-10-CM | POA: Diagnosis not present

## 2015-09-05 DIAGNOSIS — N2581 Secondary hyperparathyroidism of renal origin: Secondary | ICD-10-CM | POA: Diagnosis not present

## 2015-09-05 DIAGNOSIS — E1122 Type 2 diabetes mellitus with diabetic chronic kidney disease: Secondary | ICD-10-CM | POA: Diagnosis not present

## 2015-09-06 ENCOUNTER — Encounter: Payer: Self-pay | Admitting: Physical Therapy

## 2015-09-06 ENCOUNTER — Ambulatory Visit: Payer: Medicare Other | Admitting: Physical Therapy

## 2015-09-06 DIAGNOSIS — R531 Weakness: Secondary | ICD-10-CM

## 2015-09-06 DIAGNOSIS — R2991 Unspecified symptoms and signs involving the musculoskeletal system: Secondary | ICD-10-CM

## 2015-09-06 DIAGNOSIS — R6889 Other general symptoms and signs: Secondary | ICD-10-CM

## 2015-09-06 DIAGNOSIS — R5381 Other malaise: Secondary | ICD-10-CM

## 2015-09-06 DIAGNOSIS — Z7409 Other reduced mobility: Secondary | ICD-10-CM | POA: Diagnosis not present

## 2015-09-06 DIAGNOSIS — R269 Unspecified abnormalities of gait and mobility: Secondary | ICD-10-CM | POA: Diagnosis not present

## 2015-09-06 DIAGNOSIS — R29818 Other symptoms and signs involving the nervous system: Secondary | ICD-10-CM | POA: Diagnosis not present

## 2015-09-06 DIAGNOSIS — M79641 Pain in right hand: Secondary | ICD-10-CM | POA: Diagnosis not present

## 2015-09-06 DIAGNOSIS — R2689 Other abnormalities of gait and mobility: Secondary | ICD-10-CM

## 2015-09-06 NOTE — Therapy (Signed)
[facility] [address] [place], [place], [address] Phone: [phone]   Fax:  [phone]  Physical Therapy Treatment  Patient Details  Name: Oscar Castillo MRN: [id number] Date of Birth: 04/14/1951 Referring Provider: [name], MD  Encounter Date: 09/06/2015      PT End of Session - 09/06/15 1252    Visit Number 20   Number of Visits 26   Date for PT Re-Evaluation 09/20/15   Authorization Type Medicare Do G-Code & progress reports every 10 visits   PT Start Time 1250   PT Stop Time 1330   PT Time Calculation (min) 40 min   Equipment Utilized During Treatment Gait belt   Activity Tolerance Patient tolerated treatment well;Patient limited by pain   Behavior During Therapy [facility] for tasks assessed/performed      Past Medical History  Diagnosis Date  . ESRD on hemodialysis ([place]) 05/05/2007    ESRD due to DM/HTN. Started dialysis in November 2013.  HD TTS at [facility] on [address].  [name] BACK PAIN, LUMBAR, CHRONIC 08/06/2009  . BENIGN PROSTATIC HYPERTROPHY 08/01/2010  . CEREBROVASCULAR ACCIDENT, HX OF 08/06/2009  . CHOLELITHIASIS 08/01/2010  . CONGESTIVE HEART FAILURE 03/18/2009  . DEPRESSION 03/18/2009  . DIABETES MELLITUS, TYPE II 03/25/2007  . ERECTILE DYSFUNCTION 03/25/2007  . GERD 03/25/2007  . HEPATITIS C, HX OF 03/25/2007  . HYPERTENSION 03/25/2007  . Morbid obesity ([place]) 03/25/2007  . NEPHROLITHIASIS, HX OF 03/18/2009  . Complication of anesthesia     wife states pt had trouble waking up with his last surgery in Nov., 2014  . History of Clostridium difficile   . Hemorrhoids   . Anemia   . Antral ulcer 2014    small    Past Surgical History  Procedure Laterality Date  . Nephrectomy      partial RR  . Av fistula placement  06/14/2012    Procedure: ARTERIOVENOUS (AV) FISTULA CREATION;  Surgeon: Angelia Mould, MD;  Location[facility] OR;  Service: Vascular;  Laterality: Left;  Left basilic vein transposition  with fistula.  . Tibia im nail insertion Left 09/09/2012    Procedure: INTRAMEDULLARY (IM) NAIL TIBIAL;  Surgeon: Johnny Bridge, MD;  Location: [facility];  Service: Orthopedics;  Laterality: Left;  left tibial nail and open reduction internal fixation left fibula fracture  . Orif fibula fracture Left 09/09/2012    Procedure: OPEN REDUCTION INTERNAL FIXATION (ORIF) FIBULA FRACTURE;  Surgeon: Johnny Bridge, MD;  Location: [place];  Service: Orthopedics;  Laterality: Left;  . Colonoscopy N/A 10/28/2012    Procedure: COLONOSCOPY;  Surgeon: Jeryl Columbia, MD;  Location: [facility] ENDOSCOPY;  Service: Endoscopy;  Laterality: N/A;  . Esophagogastroduodenoscopy N/A 11/02/2012    Procedure: ESOPHAGOGASTRODUODENOSCOPY (EGD);  Surgeon: Cleotis Nipper, MD;  Location: [facility] ENDOSCOPY;  Service: Endoscopy;  Laterality: N/A;  . Colonoscopy N/A 11/02/2012    Procedure: COLONOSCOPY;  Surgeon: Cleotis Nipper, MD;  Location: [facility] ENDOSCOPY;  Service: Endoscopy;  Laterality: N/A;  . Colonoscopy N/A 11/03/2012    Procedure: COLONOSCOPY;  Surgeon: Cleotis Nipper, MD;  Location: [facility] ENDOSCOPY;  Service: Endoscopy;  Laterality: N/A;  . Givens capsule study N/A 11/04/2012    Procedure: GIVENS CAPSULE STUDY;  Surgeon: Cleotis Nipper, MD;  Location: [facility] ENDOSCOPY;  Service: Endoscopy;  Laterality: N/A;  . Enteroscopy N/A 11/08/2012    Procedure: ENTEROSCOPY;  Surgeon: Wonda Horner, MD;  Location: [facility] ENDOSCOPY;  Service: Endoscopy;  Laterality: N/A;  .  Amputation Left 05/12/2013    Procedure: AMPUTATION RAY;  Surgeon: Newt Minion, MD;  Location: [facility];  Service: Orthopedics;  Laterality: Left;  Left Foot 1st Ray Amputation  . Eye surgery Left     to remove scar tissue  . Amputation Left 06/09/2013    Procedure: AMPUTATION BELOW KNEE;  Surgeon: Newt Minion, MD;  Location: [place];  Service: Orthopedics;  Laterality: Left;  Left Below Knee Amputation and removal proximal screws IM tibial nail  . Hardware removal Left 06/09/2013     Procedure: HARDWARE REMOVAL;  Surgeon: Newt Minion, MD;  Location: [place];  Service: Orthopedics;  Laterality: Left;  Left Below Knee Amputation  and Removal proximal screws IM tibial nail  . Amputation Right 09/08/2013    Procedure: AMPUTATION BELOW KNEE;  Surgeon: Newt Minion, MD;  Location: [place];  Service: Orthopedics;  Laterality: Right;  Right Below Knee Amputation  . Amputation Right 10/11/2013    Procedure: AMPUTATION BELOW KNEE;  Surgeon: Newt Minion, MD;  Location: [place];  Service: Orthopedics;  Laterality: Right;  Right Below Knee Amputation Revision    There were no vitals filed for this visit.  Visit Diagnosis:  Generalized weakness  Balance problems  Impaired transfers  Impaired functional mobility and activity tolerance  Pain, hand joint, right  Abnormality of gait  Debility  Decreased functional activity tolerance      Subjective Assessment - 09/06/15 1251    Subjective No new complaints. No falls.   Patient is accompained by: Family member  spouse   Patient Stated Goals To be able to walk more & hopefully by himself with prostheses   Currently in Pain? Yes   Pain Score 5    Pain Location Finger (Comment which one)  all of them   Pain Orientation Right;Left   Pain Descriptors / Indicators Sore;Cramping;Aching   Pain Type Chronic pain   Pain Onset More than a month ago   Pain Frequency Constant   Aggravating Factors  dialysis   Pain Relieving Factors rest           [facility] Adult PT Treatment/Exercise - 09/06/15 1254    Transfers   Sit to Stand 3: Mod assist;With upper extremity assist;With armrests;From chair/3-in-1   Sit to Stand Details Verbal cues for sequencing;Verbal cues for technique;Tactile cues for weight shifting;Tactile cues for posture;Tactile cues for sequencing;Manual facilitation for weight shifting   Stand to Sit 3: Mod assist;With upper extremity assist;With armrests;To chair/3-in-1   Stand to Sit Details (indicate cue type and  reason) Verbal cues for precautions/safety;Verbal cues for technique;Verbal cues for sequencing;Manual facilitation for weight shifting;Tactile cues for weight shifting;Tactile cues for posture;Tactile cues for sequencing   Stand Pivot Transfers 3: Mod assist;2: Max assist  of 2 people   Stand Pivot Transfer Details (indicate cue type and reason) wheelchair<>mat table with mod to max assist of 2 people. cues on sequencing and technique. pt's knees buckling toward end of 1st one (wheelchair to mat) needing increased assist to complete transfer. mod assist for mat to wheelchair transfer using RW with cues on technique and sequence.   Comments standing at walker: 82.97 sec's with min guard assist of 2 people for safety, no belt needed for knee extension.   Ambulation/Gait   Ambulation/Gait Yes   Ambulation/Gait Assistance 3: Mod assist;Other (comment);4: Min assist  of 2 people   Ambulation/Gait Assistance Details increased assist toward end of gait trial   Ambulation Distance (Feet) 40 Feet  x1  Assistive device Prostheses;Rolling walker   Gait Pattern Decreased step length - right;Decreased step length - left;Decreased stride length;Right flexed knee in stance;Left flexed knee in stance;Trunk flexed;Narrow base of support   Ambulation Surface Level;Indoor   Prosthetics   Current prosthetic wear tolerance (days/week)  7 days   Current prosthetic wear tolerance (#hours/day)  90% of awake hours, including dialysis   Residual limb condition  intact      seated on mat: Curl ups with inverted chair behind pt x 25 reps, cues to maintain neutral position. Lateral elbow taps to mat x 10 each side, cues to maintain upright posture/decreased posterior lean with exercise.        PT Short Term Goals - 08/28/15 1315    PT SHORT TERM GOAL #1   Title Patient sit to /from stand w/c to sink with moderate assist. (NEW Target Date: 08/30/2015)   Baseline MET 08/28/2015 to RW   Status Achieved   PT SHORT  TERM GOAL #2   Title Patient & wife demonstrate / verbalize HEP / updated exercise program for fitness center. (Target Date: 07/26/2015)   Baseline 07/24/15: doing HEP on non dialysis days, seated ones every day and [facility] on [place] per family.    Status Achieved   PT SHORT TERM GOAL #3   Title Patient ambulates 62' with RW & prostheses with moderate assist (2 people for safety) (Target Date: 07/26/2015)   Baseline 07/24/15: pt able to ambulate short distance with mod assist of 2 people, did not reach 40 feet goal   Status Partially Met   PT SHORT TERM GOAL #4   Title Patient tolerates 30 seconds of standing with RW with minA. (Target Date: 08/30/2015)   Baseline MET 08/28/2015    Time 1   Period Months   Status Achieved   PT SHORT TERM GOAL #5   Title Patient ambulates 63' with RW / prostheses & turns 90* to sit in chair with armrests with moderate assist (can have 2nd person for safety). Target Date: 08/30/2015   Baseline Partially MET 08/28/2015 Patient ambulates 15' with RW /prostheses & turns 90* to sit in chair with armrests with moderate assist (2nd person required for assist)   Time 1   Period Months   Status Partially Met           PT Long Term Goals - 07/17/15 1320    PT LONG TERM GOAL #1   Title Patient and wife demonstrate proper prosthetic care. (Target Date: 09/20/2015)   Time 3   Period Months   Status On-going   PT LONG TERM GOAL #2   Title Pt & wife verbalize & demonstrate ongoing HEP / fitness plan.  (Target Date: 09/20/2015)   Time 3   Period Months   Status On-going   PT LONG TERM GOAL #3   Title stand pivot transfers with rolling walker & prostheses with supervision.  (Target Date: 09/20/2015)   Time 3   Period Months   Status On-going   PT LONG TERM GOAL #4   Title adjusts clothes & reaches 2" anteriorly while standing with RW support with minimal assist.  (Target Date: 09/20/2015)   Time 3   Period Months   Status On-going   PT LONG TERM GOAL #5    Title ambulate 32' with rolling walker & prostheses with minimal assist.  (Target Date: 09/20/2015)   Time 3   Period Months   Status On-going  Plan - 09/30/15 1253    Clinical Impression Statement Pt with increased gait distance today with walker/rprostheses with less overall assistance needed. continues to need increased assistance/cues on stand pivot transfers with walker/prostheses. Pt making slow, steady progress toward goals.   Pt will benefit from skilled therapeutic intervention in order to improve on the following deficits Abnormal gait;Decreased activity tolerance;Decreased balance;Decreased endurance;Decreased mobility;Decreased range of motion;Decreased strength;Postural dysfunction;Prosthetic Dependency   Rehab Potential Good   PT Frequency 2x / week   PT Duration Other (comment)  13 weeks (90 days)   PT Treatment/Interventions ADLs/Self Care Home Management;DME Instruction;Gait training;Stair training;Functional mobility training;Therapeutic activities;Therapeutic exercise;Balance training;Neuromuscular re-education;Patient/family education;Prosthetic Training   PT Next Visit Plan continue towards LTGs   Consulted and Agree with Plan of Care Patient;Family member/caregiver   Family Member Consulted wife         G-Codes - September 30, 2015 1514    Functional Assessment Tool Used mod to max assist for sit<>stands w/<>walker. able to stand at walker for 82.97 sec's with min guard assist with upright posture with walker not stabilized at wall.  40 feet with walker with mod assist of 2 people for safety.   Functional Limitation Changing and maintaining body position   Changing and Maintaining Body Position Current Status [phone]) At least 40 percent but less than 60 percent impaired, limited or restricted   Changing and Maintaining Body Position Goal Status ([id number]) At least 40 percent but less than 60 percent impaired, limited or restricted       Problem List Patient Active  Problem List   Diagnosis Date Noted  . Diarrhea 08/14/2014  . Dehydration 10/02/2013  . Fever 10/02/2013  . Altered mental status 10/01/2013  . Encephalopathy, toxic 10/01/2013  . Encephalopathy, metabolic 62/83/6629  . CVA (cerebral vascular accident) ([place]) 09/28/2013  . FTT (failure to thrive) in adult 09/28/2013  . Acute confusional state 09/28/2013  . Ulcer of sacral region, stage 3 ([place]) 09/26/2013  . S/P BKA (below knee amputation) bilateral ([place]) 09/08/2013  . ESRD on hemodialysis ([place]) 05/09/2013  . TIA (transient ischemic attack) 02/20/2013  . Acute blood loss anemia 10/28/2012  . Chronic combined systolic (EF 47%) and grade 2diastolic congestive heart failure 10/28/2012  . Obstipation 10/28/2012  . GI bleed 10/27/2012  . Mass in rectum 10/27/2012  . DM (diabetes mellitus) type I controlled with renal manifestation ([place]) 09/08/2012  . LVH (left ventricular hypertrophy)-severe concentric 06/13/2012  . Anemia due to chronic illness 06/12/2012  . Hyperlipidemia 01/30/2011  . CHOLELITHIASIS 08/01/2010  . BENIGN PROSTATIC HYPERTROPHY 08/01/2010  . CEREBROVASCULAR ACCIDENT, HX OF 08/06/2009  . Depression 03/18/2009  . Acute combined systolic and diastolic heart failure, NYHA class 2-EF 45% 03/18/2009  . NEPHROLITHIASIS, HX OF 03/18/2009  . Morbid obesity ([place]) 03/25/2007  . Essential hypertension 03/25/2007  . GERD 03/25/2007  . Chronic hepatitis C without hepatic coma ([place]) 03/25/2007    Willow Ora 2015-09-30, 3:18 PM  Willow Ora, PTA, [facility] [address], [place], [place] [address] [phone] September 30, 2015, 3:19 PM   Name: Oscar Castillo MRN: [id number] Date of Birth: January 11, 1951

## 2015-09-07 DIAGNOSIS — N186 End stage renal disease: Secondary | ICD-10-CM | POA: Diagnosis not present

## 2015-09-07 DIAGNOSIS — Z23 Encounter for immunization: Secondary | ICD-10-CM | POA: Diagnosis not present

## 2015-09-07 DIAGNOSIS — E1122 Type 2 diabetes mellitus with diabetic chronic kidney disease: Secondary | ICD-10-CM | POA: Diagnosis not present

## 2015-09-07 DIAGNOSIS — N2581 Secondary hyperparathyroidism of renal origin: Secondary | ICD-10-CM | POA: Diagnosis not present

## 2015-09-10 DIAGNOSIS — E1122 Type 2 diabetes mellitus with diabetic chronic kidney disease: Secondary | ICD-10-CM | POA: Diagnosis not present

## 2015-09-10 DIAGNOSIS — Z23 Encounter for immunization: Secondary | ICD-10-CM | POA: Diagnosis not present

## 2015-09-10 DIAGNOSIS — N186 End stage renal disease: Secondary | ICD-10-CM | POA: Diagnosis not present

## 2015-09-10 DIAGNOSIS — N2581 Secondary hyperparathyroidism of renal origin: Secondary | ICD-10-CM | POA: Diagnosis not present

## 2015-09-11 ENCOUNTER — Ambulatory Visit: Payer: Medicare Other | Admitting: Physical Therapy

## 2015-09-11 ENCOUNTER — Encounter: Payer: Self-pay | Admitting: Physical Therapy

## 2015-09-11 DIAGNOSIS — Z89512 Acquired absence of left leg below knee: Secondary | ICD-10-CM

## 2015-09-11 DIAGNOSIS — R2991 Unspecified symptoms and signs involving the musculoskeletal system: Secondary | ICD-10-CM

## 2015-09-11 DIAGNOSIS — R269 Unspecified abnormalities of gait and mobility: Secondary | ICD-10-CM

## 2015-09-11 DIAGNOSIS — R2689 Other abnormalities of gait and mobility: Secondary | ICD-10-CM

## 2015-09-11 DIAGNOSIS — M79641 Pain in right hand: Secondary | ICD-10-CM

## 2015-09-11 DIAGNOSIS — R531 Weakness: Secondary | ICD-10-CM

## 2015-09-11 DIAGNOSIS — Z89511 Acquired absence of right leg below knee: Secondary | ICD-10-CM

## 2015-09-11 DIAGNOSIS — R6889 Other general symptoms and signs: Secondary | ICD-10-CM

## 2015-09-11 DIAGNOSIS — Z7409 Other reduced mobility: Secondary | ICD-10-CM

## 2015-09-11 DIAGNOSIS — R29818 Other symptoms and signs involving the nervous system: Secondary | ICD-10-CM | POA: Diagnosis not present

## 2015-09-11 MED FILL — *ZEPATIER 50-100 MG TABLET: 50-100 | 28 days supply | Qty: 28 | Fill #1

## 2015-09-11 NOTE — Therapy (Signed)
[facility] [address] [place], [place], [address] Phone: [phone]   Fax:  [phone]  Physical Therapy Treatment  Patient Details  Name: Oscar Castillo MRN: [id number] Date of Birth: March 14, 1951 Referring Provider: [name], MD  Physical Therapy Progress Note  Dates of Reporting Period: 07/31/2015 to 09/06/2015  Objective Reports of Subjective Statement: Patient and wife report improved transfer ability.   Objective Measurements: Patient able to stand up to 87sec with RW with min guard.   Goal Update: see below  Plan: continue plan of care established at recertification  Reason Skilled Services are Required: Patient requires skilled assistance & instruction for standing, balance, gait & progressing strength.    Encounter Date: 09/11/2015      PT End of Session - 09/11/15 1322    Visit Number 21   Number of Visits 26   Date for PT Re-Evaluation 09/20/15   Authorization Type Medicare Do G-Code & progress reports every 10 visits   PT Start Time 1239   PT Stop Time 1317   PT Time Calculation (min) 38 min   Equipment Utilized During Treatment Gait belt   Activity Tolerance Patient tolerated treatment well;Patient limited by pain   Behavior During Therapy WFL for tasks assessed/performed      Past Medical History  Diagnosis Date  . ESRD on hemodialysis ([place]) 05/05/2007    ESRD due to DM/HTN. Started dialysis in November 2013.  HD TTS at [facility] on [place].  [name] BACK PAIN, LUMBAR, CHRONIC 08/06/2009  . BENIGN PROSTATIC HYPERTROPHY 08/01/2010  . CEREBROVASCULAR ACCIDENT, HX OF 08/06/2009  . CHOLELITHIASIS 08/01/2010  . CONGESTIVE HEART FAILURE 03/18/2009  . DEPRESSION 03/18/2009  . DIABETES MELLITUS, TYPE II 03/25/2007  . ERECTILE DYSFUNCTION 03/25/2007  . GERD 03/25/2007  . HEPATITIS C, HX OF 03/25/2007  . HYPERTENSION 03/25/2007  . Morbid obesity ([place]) 03/25/2007  . NEPHROLITHIASIS, HX OF 03/18/2009  .  Complication of anesthesia     wife states pt had trouble waking up with his last surgery in Nov., 2014  . History of Clostridium difficile   . Hemorrhoids   . Anemia   . Antral ulcer 2014    small    Past Surgical History  Procedure Laterality Date  . Nephrectomy      partial RR  . Av fistula placement  06/14/2012    Procedure: ARTERIOVENOUS (AV) FISTULA CREATION;  Surgeon: Angelia Mould, MD;  [facility] OR;  Service: Vascular;  Laterality: Left;  Left basilic vein transposition with fistula.  . Tibia im nail insertion Left 09/09/2012    Procedure: INTRAMEDULLARY (IM) NAIL TIBIAL;  Surgeon: Johnny Bridge, MD;  Location: [place];  Service: Orthopedics;  Laterality: Left;  left tibial nail and open reduction internal fixation left fibula fracture  . Orif fibula fracture Left 09/09/2012    Procedure: OPEN REDUCTION INTERNAL FIXATION (ORIF) FIBULA FRACTURE;  Surgeon: Johnny Bridge, MD;  Location: [place];  Service: Orthopedics;  Laterality: Left;  . Colonoscopy N/A 10/28/2012    Procedure: COLONOSCOPY;  Surgeon: Jeryl Columbia, MD;  Location: [facility] ENDOSCOPY;  Service: Endoscopy;  Laterality: N/A;  . Esophagogastroduodenoscopy N/A 11/02/2012    Procedure: ESOPHAGOGASTRODUODENOSCOPY (EGD);  Surgeon: Cleotis Nipper, MD;  Location: [facility] ENDOSCOPY;  Service: Endoscopy;  Laterality: N/A;  . Colonoscopy N/A 11/02/2012    Procedure: COLONOSCOPY;  Surgeon: Cleotis Nipper, MD;  Location: [facility] ENDOSCOPY;  Service: Endoscopy;  Laterality: N/A;  . Colonoscopy N/A 11/03/2012  Procedure: COLONOSCOPY;  Surgeon: Cleotis Nipper, MD;  Location: [facility] ENDOSCOPY;  Service: Endoscopy;  Laterality: N/A;  . Givens capsule study N/A 11/04/2012    Procedure: GIVENS CAPSULE STUDY;  Surgeon: Cleotis Nipper, MD;  Location: [facility] ENDOSCOPY;  Service: Endoscopy;  Laterality: N/A;  . Enteroscopy N/A 11/08/2012    Procedure: ENTEROSCOPY;  Surgeon: Wonda Horner, MD;  Location: [facility] ENDOSCOPY;  Service: Endoscopy;  Laterality:  N/A;  . Amputation Left 05/12/2013    Procedure: AMPUTATION RAY;  Surgeon: Newt Minion, MD;  Location: [place];  Service: Orthopedics;  Laterality: Left;  Left Foot 1st Ray Amputation  . Eye surgery Left     to remove scar tissue  . Amputation Left 06/09/2013    Procedure: AMPUTATION BELOW KNEE;  Surgeon: Newt Minion, MD;  Location: [place];  Service: Orthopedics;  Laterality: Left;  Left Below Knee Amputation and removal proximal screws IM tibial nail  . Hardware removal Left 06/09/2013    Procedure: HARDWARE REMOVAL;  Surgeon: Newt Minion, MD;  Location: [place];  Service: Orthopedics;  Laterality: Left;  Left Below Knee Amputation  and Removal proximal screws IM tibial nail  . Amputation Right 09/08/2013    Procedure: AMPUTATION BELOW KNEE;  Surgeon: Newt Minion, MD;  Location: [place];  Service: Orthopedics;  Laterality: Right;  Right Below Knee Amputation  . Amputation Right 10/11/2013    Procedure: AMPUTATION BELOW KNEE;  Surgeon: Newt Minion, MD;  Location: [place];  Service: Orthopedics;  Laterality: Right;  Right Below Knee Amputation Revision    There were no vitals filed for this visit.  Visit Diagnosis:  Generalized weakness  Balance problems  Impaired transfers  Impaired functional mobility and activity tolerance  Pain, hand joint, right  Abnormality of gait  Decreased functional activity tolerance  Status post bilateral below knee amputation (HCC)      Subjective Assessment - 09/11/15 1241    Subjective No falls. He is going to [place] once per week in addtion to PT & uses leg press / NuStep.    Currently in Pain? Yes   Pain Score 5    Pain Location Hand   Pain Orientation Right   Pain Descriptors / Indicators Sore   Pain Type Chronic pain   Pain Onset More than a month ago   Pain Frequency Constant   Aggravating Factors  dialysis   Pain Relieving Factors rest                         [facility] Adult PT Treatment/Exercise - 09/11/15 1235     Transfers   Sit to Stand 3: Mod assist;With upper extremity assist;With armrests;From chair/3-in-1  to RW with prostheses   Sit to Stand Details Verbal cues for sequencing;Verbal cues for technique;Tactile cues for weight shifting;Tactile cues for posture;Tactile cues for sequencing;Manual facilitation for weight shifting   Sit to Stand Details (indicate cue type and reason) cues for extension of LEs & UEs for upright posture.    Stand to Sit With upper extremity assist;With armrests;To chair/3-in-1;4: Min assist  from RW    Stand to Sit Details (indicate cue type and reason) Verbal cues for precautions/safety;Verbal cues for technique;Verbal cues for sequencing;Manual facilitation for weight shifting;Tactile cues for weight shifting;Tactile cues for posture;Tactile cues for sequencing   Stand Pivot Transfers 3: Mod assist  with RW with assist to control pelvis to 2nd chair   Stand Pivot Transfer Details (indicate cue type  and reason) cues on maintaining upright posture in turn   Comments standing at walker: 65 sec's with min guard assist 1 person,. PT cued pt and wife to perform standing only (no gait ) at home to RW or sink   Ambulation/Gait   Ambulation/Gait Yes   Ambulation/Gait Assistance Other (comment);3: Mod assist  of 2 people for safety, 2nd person SBA on straight path   Ambulation/Gait Assistance Details cues on upright posture, extension of knees in stance and maintaining upright to turn 90* to position to sit in chair    Ambulation Distance (Feet) 30 Feet  20' X 2 with 90* turn to sit, 30" wife following w/ w/c   Assistive device Prostheses;Rolling walker   Gait Pattern Decreased step length - right;Decreased step length - left;Decreased stride length;Right flexed knee in stance;Left flexed knee in stance;Trunk flexed;Narrow base of support   Knee/Hip Exercises: Aerobic   Nustep Level 3 with 4 extremities for 8 min with cues on full ROM   Prosthetics   Prosthetic Care Comments   PT redonned prostheses as liners not directly against distal limb which would cause decreased control    Current prosthetic wear tolerance (days/week)  7 days   Current prosthetic wear tolerance (#hours/day)  90% of awake hours, including dialysis   Residual limb condition  intact                   PT Short Term Goals - 08/28/15 1315    PT SHORT TERM GOAL #1   Title Patient sit to /from stand w/c to sink with moderate assist. (NEW Target Date: 08/30/2015)   Baseline MET 08/28/2015 to RW   Status Achieved   PT SHORT TERM GOAL #2   Title Patient & wife demonstrate / verbalize HEP / updated exercise program for fitness center. (Target Date: 07/26/2015)   Baseline 07/24/15: doing HEP on non dialysis days, seated ones every day and [facility] on [place] per family.    Status Achieved   PT SHORT TERM GOAL #3   Title Patient ambulates 31' with RW & prostheses with moderate assist (2 people for safety) (Target Date: 07/26/2015)   Baseline 07/24/15: pt able to ambulate short distance with mod assist of 2 people, did not reach 40 feet goal   Status Partially Met   PT SHORT TERM GOAL #4   Title Patient tolerates 30 seconds of standing with RW with minA. (Target Date: 08/30/2015)   Baseline MET 08/28/2015    Time 1   Period Months   Status Achieved   PT SHORT TERM GOAL #5   Title Patient ambulates 73' with RW / prostheses & turns 90* to sit in chair with armrests with moderate assist (can have 2nd person for safety). Target Date: 08/30/2015   Baseline Partially MET 08/28/2015 Patient ambulates 15' with RW /prostheses & turns 90* to sit in chair with armrests with moderate assist (2nd person required for assist)   Time 1   Period Months   Status Partially Met           PT Long Term Goals - 07/17/15 1320    PT LONG TERM GOAL #1   Title Patient and wife demonstrate proper prosthetic care. (Target Date: 09/20/2015)   Time 3   Period Months   Status On-going   PT LONG TERM GOAL #2   Title  Pt & wife verbalize & demonstrate ongoing HEP / fitness plan.  (Target Date: 09/20/2015)   Time 3   Period  Months   Status On-going   PT LONG TERM GOAL #3   Title stand pivot transfers with rolling walker & prostheses with supervision.  (Target Date: 09/20/2015)   Time 3   Period Months   Status On-going   PT LONG TERM GOAL #4   Title adjusts clothes & reaches 2" anteriorly while standing with RW support with minimal assist.  (Target Date: 09/20/2015)   Time 3   Period Months   Status On-going   PT LONG TERM GOAL #5   Title ambulate 33' with rolling walker & prostheses with minimal assist.  (Target Date: 09/20/2015)   Time 3   Period Months   Status On-going               Problem List Patient Active Problem List   Diagnosis Date Noted  . Diarrhea 08/14/2014  . Dehydration 10/02/2013  . Fever 10/02/2013  . Altered mental status 10/01/2013  . Encephalopathy, toxic 10/01/2013  . Encephalopathy, metabolic 05/20/8526  . CVA (cerebral vascular accident) ([place]) 09/28/2013  . FTT (failure to thrive) in adult 09/28/2013  . Acute confusional state 09/28/2013  . Ulcer of sacral region, stage 3 ([place]) 09/26/2013  . S/P BKA (below knee amputation) bilateral ([place]) 09/08/2013  . ESRD on hemodialysis ([place]) 05/09/2013  . TIA (transient ischemic attack) 02/20/2013  . Acute blood loss anemia 10/28/2012  . Chronic combined systolic (EF 78%) and grade 2diastolic congestive heart failure 10/28/2012  . Obstipation 10/28/2012  . GI bleed 10/27/2012  . Mass in rectum 10/27/2012  . DM (diabetes mellitus) type I controlled with renal manifestation ([place]) 09/08/2012  . LVH (left ventricular hypertrophy)-severe concentric 06/13/2012  . Anemia due to chronic illness 06/12/2012  . Hyperlipidemia 01/30/2011  . CHOLELITHIASIS 08/01/2010  . BENIGN PROSTATIC HYPERTROPHY 08/01/2010  . CEREBROVASCULAR ACCIDENT, HX OF 08/06/2009  . Depression 03/18/2009  . Acute combined systolic and diastolic heart  failure, NYHA class 2-EF 45% 03/18/2009  . NEPHROLITHIASIS, HX OF 03/18/2009  . Morbid obesity ([place]) 03/25/2007  . Essential hypertension 03/25/2007  . GERD 03/25/2007  . Chronic hepatitis C without hepatic coma ([place]) 03/25/2007    Khalilah Hoke PT, DPT 09/11/2015, 1:24 PM  [facility] [address] [place], [place], [address] Phone: [phone]   Fax:  [phone]  Name: SEVEN DOLLENS MRN: [id number] Date of Birth: January 24, 1951

## 2015-09-12 DIAGNOSIS — E1122 Type 2 diabetes mellitus with diabetic chronic kidney disease: Secondary | ICD-10-CM | POA: Diagnosis not present

## 2015-09-12 DIAGNOSIS — N186 End stage renal disease: Secondary | ICD-10-CM | POA: Diagnosis not present

## 2015-09-12 DIAGNOSIS — Z23 Encounter for immunization: Secondary | ICD-10-CM | POA: Diagnosis not present

## 2015-09-12 DIAGNOSIS — N2581 Secondary hyperparathyroidism of renal origin: Secondary | ICD-10-CM | POA: Diagnosis not present

## 2015-09-13 ENCOUNTER — Ambulatory Visit: Payer: Medicare Other | Admitting: Physical Therapy

## 2015-09-13 DIAGNOSIS — H401130 Primary open-angle glaucoma, bilateral, stage unspecified: Secondary | ICD-10-CM | POA: Diagnosis not present

## 2015-09-13 DIAGNOSIS — H2511 Age-related nuclear cataract, right eye: Secondary | ICD-10-CM | POA: Diagnosis not present

## 2015-09-13 DIAGNOSIS — H18411 Arcus senilis, right eye: Secondary | ICD-10-CM | POA: Diagnosis not present

## 2015-09-13 DIAGNOSIS — Z961 Presence of intraocular lens: Secondary | ICD-10-CM | POA: Diagnosis not present

## 2015-09-16 ENCOUNTER — Ambulatory Visit: Payer: Medicare Other | Admitting: Physical Therapy

## 2015-09-16 ENCOUNTER — Encounter: Payer: Self-pay | Admitting: Physical Therapy

## 2015-09-16 DIAGNOSIS — Z7409 Other reduced mobility: Secondary | ICD-10-CM

## 2015-09-16 DIAGNOSIS — R29818 Other symptoms and signs involving the nervous system: Secondary | ICD-10-CM | POA: Diagnosis not present

## 2015-09-16 DIAGNOSIS — R6889 Other general symptoms and signs: Secondary | ICD-10-CM

## 2015-09-16 DIAGNOSIS — R531 Weakness: Secondary | ICD-10-CM

## 2015-09-16 DIAGNOSIS — R2991 Unspecified symptoms and signs involving the musculoskeletal system: Secondary | ICD-10-CM

## 2015-09-16 DIAGNOSIS — M79641 Pain in right hand: Secondary | ICD-10-CM | POA: Diagnosis not present

## 2015-09-16 DIAGNOSIS — R2689 Other abnormalities of gait and mobility: Secondary | ICD-10-CM

## 2015-09-16 DIAGNOSIS — M24669 Ankylosis, unspecified knee: Secondary | ICD-10-CM

## 2015-09-16 DIAGNOSIS — R269 Unspecified abnormalities of gait and mobility: Secondary | ICD-10-CM | POA: Diagnosis not present

## 2015-09-16 DIAGNOSIS — Z89511 Acquired absence of right leg below knee: Secondary | ICD-10-CM

## 2015-09-16 DIAGNOSIS — Z89512 Acquired absence of left leg below knee: Secondary | ICD-10-CM

## 2015-09-16 NOTE — Therapy (Signed)
[facility] [address] [place], [place], [address] Phone: [phone]   Fax:  [phone]  Physical Therapy Treatment  Patient Details  Name: Oscar Castillo MRN: [id number] Date of Birth: [birth year] Referring Provider: [name], MD  Encounter Date: 09/16/2015      PT End of Session - 09/16/15 1145    Visit Number 22   Number of Visits 40   Date for PT Re-Evaluation 11/15/15   Authorization Type Medicare Do G-Code & progress reports every 10 visits   PT Start Time 1102   PT Stop Time 1145   PT Time Calculation (min) 43 min   Equipment Utilized During Treatment Gait belt   Activity Tolerance Patient tolerated treatment well;Patient limited by pain   Behavior During Therapy [facility] for tasks assessed/performed      Past Medical History  Diagnosis Date  . ESRD on hemodialysis ([place]) 05/05/2007    ESRD due to DM/HTN. Started dialysis in November 2013.  HD TTS at [facility] on [address].  [name] BACK PAIN, LUMBAR, CHRONIC 08/06/2009  . BENIGN PROSTATIC HYPERTROPHY 08/01/2010  . CEREBROVASCULAR ACCIDENT, HX OF 08/06/2009  . CHOLELITHIASIS 08/01/2010  . CONGESTIVE HEART FAILURE 03/18/2009  . DEPRESSION 03/18/2009  . DIABETES MELLITUS, TYPE II 03/25/2007  . ERECTILE DYSFUNCTION 03/25/2007  . GERD 03/25/2007  . HEPATITIS C, HX OF 03/25/2007  . HYPERTENSION 03/25/2007  . Morbid obesity ([place]) 03/25/2007  . NEPHROLITHIASIS, HX OF 03/18/2009  . Complication of anesthesia     wife states pt had trouble waking up with his last surgery in Nov., 2014  . History of Clostridium difficile   . Hemorrhoids   . Anemia   . Antral ulcer 2014    small    Past Surgical History  Procedure Laterality Date  . Nephrectomy      partial RR  . Av fistula placement  06/14/2012    Procedure: ARTERIOVENOUS (AV) FISTULA CREATION;  Surgeon: Angelia Mould, MD;  Location: [facility] OR;  Service: Vascular;  Laterality: Left;  Left basilic vein transposition  with fistula.  . Tibia im nail insertion Left 09/09/2012    Procedure: INTRAMEDULLARY (IM) NAIL TIBIAL;  Surgeon: Johnny Bridge, MD;  Location: [facility];  Service: Orthopedics;  Laterality: Left;  left tibial nail and open reduction internal fixation left fibula fracture  . Orif fibula fracture Left 09/09/2012    Procedure: OPEN REDUCTION INTERNAL FIXATION (ORIF) FIBULA FRACTURE;  Surgeon: Johnny Bridge, MD;  Location: [place];  Service: Orthopedics;  Laterality: Left;  . Colonoscopy N/A 10/28/2012    Procedure: COLONOSCOPY;  Surgeon: Jeryl Columbia, MD;  Location: [facility] ENDOSCOPY;  Service: Endoscopy;  Laterality: N/A;  . Esophagogastroduodenoscopy N/A 11/02/2012    Procedure: ESOPHAGOGASTRODUODENOSCOPY (EGD);  Surgeon: Cleotis Nipper, MD;  Location: [facility] ENDOSCOPY;  Service: Endoscopy;  Laterality: N/A;  . Colonoscopy N/A 11/02/2012    Procedure: COLONOSCOPY;  Surgeon: Cleotis Nipper, MD;  Location: [facility] ENDOSCOPY;  Service: Endoscopy;  Laterality: N/A;  . Colonoscopy N/A 11/03/2012    Procedure: COLONOSCOPY;  Surgeon: Cleotis Nipper, MD;  Location: [facility] ENDOSCOPY;  Service: Endoscopy;  Laterality: N/A;  . Givens capsule study N/A 11/04/2012    Procedure: GIVENS CAPSULE STUDY;  Surgeon: Cleotis Nipper, MD;  Location: [facility] ENDOSCOPY;  Service: Endoscopy;  Laterality: N/A;  . Enteroscopy N/A 11/08/2012    Procedure: ENTEROSCOPY;  Surgeon: Wonda Horner, MD;  Location: [facility] ENDOSCOPY;  Service: Endoscopy;  Laterality: N/A;  .  Amputation Left 05/12/2013    Procedure: AMPUTATION RAY;  Surgeon: Newt Minion, MD;  Location: [place];  Service: Orthopedics;  Laterality: Left;  Left Foot 1st Ray Amputation  . Eye surgery Left     to remove scar tissue  . Amputation Left 06/09/2013    Procedure: AMPUTATION BELOW KNEE;  Surgeon: Newt Minion, MD;  Location: [place];  Service: Orthopedics;  Laterality: Left;  Left Below Knee Amputation and removal proximal screws IM tibial nail  . Hardware removal Left 06/09/2013     Procedure: HARDWARE REMOVAL;  Surgeon: Newt Minion, MD;  Location: [place];  Service: Orthopedics;  Laterality: Left;  Left Below Knee Amputation  and Removal proximal screws IM tibial nail  . Amputation Right 09/08/2013    Procedure: AMPUTATION BELOW KNEE;  Surgeon: Newt Minion, MD;  Location: [place];  Service: Orthopedics;  Laterality: Right;  Right Below Knee Amputation  . Amputation Right 10/11/2013    Procedure: AMPUTATION BELOW KNEE;  Surgeon: Newt Minion, MD;  Location: [place];  Service: Orthopedics;  Laterality: Right;  Right Below Knee Amputation Revision    There were no vitals filed for this visit.  Visit Diagnosis:  Generalized weakness  Balance problems  Impaired transfers  Impaired functional mobility and activity tolerance  Pain, hand joint, right  Abnormality of gait  Decreased functional activity tolerance  Status post bilateral below knee amputation (HCC)  Decreased range of knee movement, unspecified laterality      Subjective Assessment - 09/16/15 1100    Subjective He reports he has done some standing at home but limited.    Patient is accompained by: Family member   Currently in Pain? Yes   Pain Score 5    Pain Location Hand   Pain Orientation Right;Left   Pain Descriptors / Indicators Sore   Pain Type Chronic pain   Pain Onset More than a month ago   Pain Frequency Constant   Aggravating Factors  dialysis   Pain Relieving Factors rest   Multiple Pain Sites No     Therapeutic Exercise: PT reviewed demo, verbal instruction for seated hamstring, knee extension stretch / positioning. PT recommended positioning with knee extended on second chair for 5-10 minutes each 2-4 times per day.   Prosthetic Training with bilateral Transtibial Amputation prostheses: Sit to stand w/c to RW with minA. Pt able to maintain upright with RW for 1 minute with min guard. He is able to reach 2" anteriorly with minA.  Patient ambulated 44' with RW & prostheses with  minA for straight path & modA to turn 90* to position to sit.  Patient ambulated 26' straight path with wife's assist and PT also providing minA.  PT attached RW to w/c with theraband to enable w/c to stay with patient as potential to allow gait with wife at home in future. Pt ambulated 20' X 2 with RW with minA with PT cueing technique to pt & wife.                             PT Education - 09/16/15 1145    Education provided Yes   Education Details need for exercises & activities to progress mobility   Person(s) Educated Patient   Methods Explanation;Verbal cues   Comprehension Verbalized understanding;Verbal cues required          PT Short Term Goals - 09/16/15 1230    PT SHORT TERM GOAL #  1   Title Patient sit to /from stand w/c to sink with moderate assist. (NEW Target Date: 08/30/2015)    Baseline MET 08/28/2015 to RW   Status Achieved   PT SHORT TERM GOAL #2   Title Patient & wife demonstrate / verbalize HEP / updated exercise program for fitness center. (Target Date: 07/26/2015)   Baseline 07/24/15: doing HEP on non dialysis days, seated ones every day and [facility] on [place] per family.    Status Achieved   PT SHORT TERM GOAL #3   Title Patient ambulates 64' with RW & prostheses with moderate assist (2 people for safety) (Target Date: 07/26/2015)   Baseline 07/24/15: pt able to ambulate short distance with mod assist of 2 people, did not reach 40 feet goal   Status Partially Met   PT SHORT TERM GOAL #4   Title Patient tolerates 30 seconds of standing with RW with minA. (Target Date: 08/30/2015)   Baseline MET 08/28/2015    Time 1   Period Months   Status Achieved   PT SHORT TERM GOAL #5   Title Patient ambulates 85' with RW / prostheses & turns 90* to sit in chair with armrests with moderate assist (can have 2nd person for safety). Target Date: 08/30/2015   Baseline Partially MET 08/28/2015 Patient ambulates 15' with RW /prostheses & turns 90* to sit in  chair with armrests with moderate assist (2nd person required for assist)   Time 1   Period Months   Status Partially Met   Additional Short Term Goals   Additional Short Term Goals Yes   PT SHORT TERM GOAL #6   Title Patient verbalizes standing & HEP compliance >/= 4 days/wk. (Target Date: 10/16/2015)   Time 1   Period Months   Status New   PT SHORT TERM GOAL #7   Title Patient ambulates 45' with RW & prostheses with wife's assist with PT supervising.  (Target Date: 10/16/2015)   Time 1   Period Months   Status New   PT SHORT TERM GOAL #8   Title Patient ambulates 16' with RW & prostheses with minA.  (Target Date: 10/16/2015)   Time 1   Period Months   Status New           PT Long Term Goals - 09/16/15 1145    PT LONG TERM GOAL #1   Title Patient and wife demonstrate proper prosthetic care. (Target Date: 09/20/2015)   Baseline MET 09/16/2015   Time 3   Period Months   Status Achieved   PT LONG TERM GOAL #2   Title Pt & wife verbalize & demonstrate ongoing HEP / fitness plan.  (Target Date: 09/20/2015)  NEW Target DATE 11/15/2015   Baseline Patient is independent in current HEP but PT continues to update. 09/16/2015   Time 3   Period Months   Status Partially Met   PT LONG TERM GOAL #3   Title stand pivot transfers with rolling walker & prostheses with supervision.  (Target Date: 09/20/2015) NEW Target DATE 11/15/2015   Baseline NOT MET 09/16/2015 Patient flucuatates between [place] for stand pivot transfers.    Time 3   Period Months   Status On-going   PT LONG TERM GOAL #4   Title adjusts clothes & reaches 2" anteriorly while standing with RW support with minimal assist.  (Target Date: 09/20/2015)  NEW Target DATE 11/15/2015   Baseline PARTIALLY MET 09/16/2015 Patient reaches 2" anteriorly with minA but unable to adjust  his clothes.    Time 3   Period Months   Status On-going   PT LONG TERM GOAL #5   Title ambulate 3' with rolling walker & prostheses with minimal assist.   (Target Date: 09/20/2015)  NEW Target DATE 11/15/2015   Baseline Partially MET 09/16/2015 Patient ambulates 79' with RW & prostheses with minA for straight pathes & modA to turn to position to sit.    Time 3   Period Months   Status Revised               Plan - 09/16/15 1230    Clinical Impression Statement Patient has progressed with his gait & standing but has not met his LTGs. Patient would benefit from further PT to maximize his potential. Patient's wife was able to assist his gait with PT support but requires w/c directly behind him for safety and there is no one else available at their home.    Pt will benefit from skilled therapeutic intervention in order to improve on the following deficits Abnormal gait;Decreased activity tolerance;Decreased balance;Decreased endurance;Decreased mobility;Decreased range of motion;Decreased strength;Postural dysfunction;Prosthetic Dependency   Rehab Potential Good   PT Frequency 2x / week   PT Duration Other (comment)  9 weeks (60 days)   PT Treatment/Interventions ADLs/Self Care Home Management;DME Instruction;Gait training;Stair training;Functional mobility training;Therapeutic activities;Therapeutic exercise;Balance training;Neuromuscular re-education;Patient/family education;Prosthetic Training   PT Next Visit Plan continue towards updated STGs & LTGs   Consulted and Agree with Plan of Care Patient;Family member/caregiver   Family Member Consulted wife        Problem List Patient Active Problem List   Diagnosis Date Noted  . Diarrhea 08/14/2014  . Dehydration 10/02/2013  . Fever 10/02/2013  . Altered mental status 10/01/2013  . Encephalopathy, toxic 10/01/2013  . Encephalopathy, metabolic 87/56/4332  . CVA (cerebral vascular accident) ([place]) 09/28/2013  . FTT (failure to thrive) in adult 09/28/2013  . Acute confusional state 09/28/2013  . Ulcer of sacral region, stage 3 ([place]) 09/26/2013  . S/P BKA (below knee amputation) bilateral  ([place]) 09/08/2013  . ESRD on hemodialysis ([place]) 05/09/2013  . TIA (transient ischemic attack) 02/20/2013  . Acute blood loss anemia 10/28/2012  . Chronic combined systolic (EF 95%) and grade 2diastolic congestive heart failure 10/28/2012  . Obstipation 10/28/2012  . GI bleed 10/27/2012  . Mass in rectum 10/27/2012  . DM (diabetes mellitus) type I controlled with renal manifestation ([place]) 09/08/2012  . LVH (left ventricular hypertrophy)-severe concentric 06/13/2012  . Anemia due to chronic illness 06/12/2012  . Hyperlipidemia 01/30/2011  . CHOLELITHIASIS 08/01/2010  . BENIGN PROSTATIC HYPERTROPHY 08/01/2010  . CEREBROVASCULAR ACCIDENT, HX OF 08/06/2009  . Depression 03/18/2009  . Acute combined systolic and diastolic heart failure, NYHA class 2-EF 45% 03/18/2009  . NEPHROLITHIASIS, HX OF 03/18/2009  . Morbid obesity ([place]) 03/25/2007  . Essential hypertension 03/25/2007  . GERD 03/25/2007  . Chronic hepatitis C without hepatic coma ([place]) 03/25/2007    Santos Sollenberger PT, DPT 09/16/2015, 9:07 PM  [facility] [address] [place], [place], [address] Phone: [phone]   Fax:  [phone]  Name: YEUDIEL MATEO MRN: [id number] Date of Birth: 1951-03-02

## 2015-09-17 DIAGNOSIS — E1122 Type 2 diabetes mellitus with diabetic chronic kidney disease: Secondary | ICD-10-CM | POA: Diagnosis not present

## 2015-09-17 DIAGNOSIS — N2581 Secondary hyperparathyroidism of renal origin: Secondary | ICD-10-CM | POA: Diagnosis not present

## 2015-09-17 DIAGNOSIS — N186 End stage renal disease: Secondary | ICD-10-CM | POA: Diagnosis not present

## 2015-09-17 DIAGNOSIS — Z23 Encounter for immunization: Secondary | ICD-10-CM | POA: Diagnosis not present

## 2015-09-18 ENCOUNTER — Encounter: Payer: Self-pay | Admitting: Physical Therapy

## 2015-09-19 DIAGNOSIS — E1122 Type 2 diabetes mellitus with diabetic chronic kidney disease: Secondary | ICD-10-CM | POA: Diagnosis not present

## 2015-09-19 DIAGNOSIS — Z23 Encounter for immunization: Secondary | ICD-10-CM | POA: Diagnosis not present

## 2015-09-19 DIAGNOSIS — N186 End stage renal disease: Secondary | ICD-10-CM | POA: Diagnosis not present

## 2015-09-19 DIAGNOSIS — N2581 Secondary hyperparathyroidism of renal origin: Secondary | ICD-10-CM | POA: Diagnosis not present

## 2015-09-20 ENCOUNTER — Ambulatory Visit: Payer: Medicare Other | Admitting: Physical Therapy

## 2015-09-20 DIAGNOSIS — R29818 Other symptoms and signs involving the nervous system: Secondary | ICD-10-CM | POA: Diagnosis not present

## 2015-09-20 DIAGNOSIS — R2991 Unspecified symptoms and signs involving the musculoskeletal system: Secondary | ICD-10-CM | POA: Diagnosis not present

## 2015-09-20 DIAGNOSIS — R6889 Other general symptoms and signs: Secondary | ICD-10-CM

## 2015-09-20 DIAGNOSIS — Z7409 Other reduced mobility: Secondary | ICD-10-CM

## 2015-09-20 DIAGNOSIS — R269 Unspecified abnormalities of gait and mobility: Secondary | ICD-10-CM | POA: Diagnosis not present

## 2015-09-20 DIAGNOSIS — R5381 Other malaise: Secondary | ICD-10-CM

## 2015-09-20 DIAGNOSIS — Z89512 Acquired absence of left leg below knee: Secondary | ICD-10-CM

## 2015-09-20 DIAGNOSIS — M79641 Pain in right hand: Secondary | ICD-10-CM | POA: Diagnosis not present

## 2015-09-20 DIAGNOSIS — R531 Weakness: Secondary | ICD-10-CM

## 2015-09-20 DIAGNOSIS — M24669 Ankylosis, unspecified knee: Secondary | ICD-10-CM

## 2015-09-20 DIAGNOSIS — R2689 Other abnormalities of gait and mobility: Secondary | ICD-10-CM

## 2015-09-20 DIAGNOSIS — Z89511 Acquired absence of right leg below knee: Secondary | ICD-10-CM

## 2015-09-21 DIAGNOSIS — E1122 Type 2 diabetes mellitus with diabetic chronic kidney disease: Secondary | ICD-10-CM | POA: Diagnosis not present

## 2015-09-21 DIAGNOSIS — N2581 Secondary hyperparathyroidism of renal origin: Secondary | ICD-10-CM | POA: Diagnosis not present

## 2015-09-21 DIAGNOSIS — N186 End stage renal disease: Secondary | ICD-10-CM | POA: Diagnosis not present

## 2015-09-21 DIAGNOSIS — Z23 Encounter for immunization: Secondary | ICD-10-CM | POA: Diagnosis not present

## 2015-09-22 NOTE — Therapy (Signed)
[facility] [address] [place], [place], [address] Phone: [phone]   Fax:  [phone]  Physical Therapy Treatment  Patient Details  Name: Oscar Castillo MRN: [id number] Date of Birth: 1950/11/21 Referring Provider: [name], MD  Encounter Date: 09/20/2015   09/20/15 1238  PT Visits / Re-Eval  Visit Number 23  Number of Visits 40  Date for PT Re-Evaluation 11/15/15  Authorization  Authorization Type Medicare Do G-Code & progress reports every 10 visits  PT Time Calculation  PT Start Time 1233  PT Stop Time 1315  PT Time Calculation (min) 42 min  PT - End of Session  Equipment Utilized During Treatment Gait belt  Activity Tolerance Patient tolerated treatment well;Patient limited by pain  Behavior During Therapy [facility] for tasks assessed/performed      Past Medical History  Diagnosis Date  . ESRD on hemodialysis ([place]) 05/05/2007    ESRD due to DM/HTN. Started dialysis in November 2013.  HD TTS at [facility] on [place].  [name] BACK PAIN, LUMBAR, CHRONIC 08/06/2009  . BENIGN PROSTATIC HYPERTROPHY 08/01/2010  . CEREBROVASCULAR ACCIDENT, HX OF 08/06/2009  . CHOLELITHIASIS 08/01/2010  . CONGESTIVE HEART FAILURE 03/18/2009  . DEPRESSION 03/18/2009  . DIABETES MELLITUS, TYPE II 03/25/2007  . ERECTILE DYSFUNCTION 03/25/2007  . GERD 03/25/2007  . HEPATITIS C, HX OF 03/25/2007  . HYPERTENSION 03/25/2007  . Morbid obesity ([place]) 03/25/2007  . NEPHROLITHIASIS, HX OF 03/18/2009  . Complication of anesthesia     wife states pt had trouble waking up with his last surgery in Nov., 2014  . History of Clostridium difficile   . Hemorrhoids   . Anemia   . Antral ulcer 2014    small    Past Surgical History  Procedure Laterality Date  . Nephrectomy      partial RR  . Av fistula placement  06/14/2012    Procedure: ARTERIOVENOUS (AV) FISTULA CREATION;  Surgeon: Angelia Mould, MD;  Location: [facility] OR;  Service: Vascular;   Laterality: Left;  Left basilic vein transposition with fistula.  . Tibia im nail insertion Left 09/09/2012    Procedure: INTRAMEDULLARY (IM) NAIL TIBIAL;  Surgeon: Johnny Bridge, MD;  Location: [employer];  Service: Orthopedics;  Laterality: Left;  left tibial nail and open reduction internal fixation left fibula fracture  . Orif fibula fracture Left 09/09/2012    Procedure: OPEN REDUCTION INTERNAL FIXATION (ORIF) FIBULA FRACTURE;  Surgeon: Johnny Bridge, MD;  Location: [place];  Service: Orthopedics;  Laterality: Left;  . Colonoscopy N/A 10/28/2012    Procedure: COLONOSCOPY;  Surgeon: Jeryl Columbia, MD;  Location: [facility] ENDOSCOPY;  Service: Endoscopy;  Laterality: N/A;  . Esophagogastroduodenoscopy N/A 11/02/2012    Procedure: ESOPHAGOGASTRODUODENOSCOPY (EGD);  Surgeon: Cleotis Nipper, MD;  Location: [facility] ENDOSCOPY;  Service: Endoscopy;  Laterality: N/A;  . Colonoscopy N/A 11/02/2012    Procedure: COLONOSCOPY;  Surgeon: Cleotis Nipper, MD;  Location: [facility] ENDOSCOPY;  Service: Endoscopy;  Laterality: N/A;  . Colonoscopy N/A 11/03/2012    Procedure: COLONOSCOPY;  Surgeon: Cleotis Nipper, MD;  Location: [facility] ENDOSCOPY;  Service: Endoscopy;  Laterality: N/A;  . Givens capsule study N/A 11/04/2012    Procedure: GIVENS CAPSULE STUDY;  Surgeon: Cleotis Nipper, MD;  Location: [facility] ENDOSCOPY;  Service: Endoscopy;  Laterality: N/A;  . Enteroscopy N/A 11/08/2012    Procedure: ENTEROSCOPY;  Surgeon: Wonda Horner, MD;  Location[facility] ENDOSCOPY;  Service: Endoscopy;  Laterality: N/A;  . Amputation Left  05/12/2013    Procedure: AMPUTATION RAY;  Surgeon: Newt Minion, MD;  Location: [place];  Service: Orthopedics;  Laterality: Left;  Left Foot 1st Ray Amputation  . Eye surgery Left     to remove scar tissue  . Amputation Left 06/09/2013    Procedure: AMPUTATION BELOW KNEE;  Surgeon: Newt Minion, MD;  Location: [place];  Service: Orthopedics;  Laterality: Left;  Left Below Knee Amputation and removal proximal screws IM tibial  nail  . Hardware removal Left 06/09/2013    Procedure: HARDWARE REMOVAL;  Surgeon: Newt Minion, MD;  Location: [place];  Service: Orthopedics;  Laterality: Left;  Left Below Knee Amputation  and Removal proximal screws IM tibial nail  . Amputation Right 09/08/2013    Procedure: AMPUTATION BELOW KNEE;  Surgeon: Newt Minion, MD;  Location: [place];  Service: Orthopedics;  Laterality: Right;  Right Below Knee Amputation  . Amputation Right 10/11/2013    Procedure: AMPUTATION BELOW KNEE;  Surgeon: Newt Minion, MD;  Location: [place];  Service: Orthopedics;  Laterality: Right;  Right Below Knee Amputation Revision    There were no vitals filed for this visit.  Visit Diagnosis:  Generalized weakness  Balance problems  Impaired transfers  Impaired functional mobility and activity tolerance  Pain, hand joint, right  Abnormality of gait  Decreased functional activity tolerance  Status post bilateral below knee amputation (HCC)  Debility  Decreased range of knee movement, unspecified laterality     09/20/15 1237  Symptoms/Limitations  Subjective No falls. Reports limited walking with spouse/walker/prostheses at home, mostly standing at walker/sink and stretching legs by propping feet on chair. Hands are tight today.  Patient is accompained by: Family member (spouse)  Patient Stated Goals To be able to walk more & hopefully by himself with prostheses  Pain Assessment  Currently in Pain? Yes  Pain Score 4  Pain Location Hand  Pain Orientation Right;Left  Pain Descriptors / Indicators Sore;Tightness  Pain Type Chronic pain  Pain Onset More than a month ago  Pain Frequency Constant  Aggravating Factors  dialysis  Pain Relieving Factors medical cream MD gave him      09/20/15 1239  Transfers  Sit to Stand 3: Mod assist;With upper extremity assist;With armrests;From chair/3-in-1 (2 person assist)  Sit to Stand Details Verbal cues for sequencing;Verbal cues for technique;Tactile  cues for weight shifting;Tactile cues for posture;Tactile cues for sequencing;Manual facilitation for weight shifting  Stand to Sit 3: Mod assist;With upper extremity assist;To chair/3-in-1;Uncontrolled descent (2 person assist)  Stand to Sit Details (indicate cue type and reason) Verbal cues for precautions/safety;Verbal cues for technique;Verbal cues for sequencing;Manual facilitation for weight shifting;Tactile cues for weight shifting;Tactile cues for posture;Tactile cues for sequencing  Ambulation/Gait  Ambulation/Gait Yes  Ambulation/Gait Assistance 3: Mod assist (of 2 people for safety, 2cd only needed toward end of each)  Ambulation/Gait Assistance Details cues needed for posture, terminal knee extension in stance, step length and walker position with gait.   Ambulation Distance (Feet) 42 Feet (x1, 43 x1)  Assistive device Prostheses;Rolling walker  Gait Pattern Decreased step length - right;Decreased step length - left;Decreased stride length;Right flexed knee in stance;Left flexed knee in stance;Trunk flexed;Narrow base of support  Ambulation Surface Level;Indoor  Prosthetics  Current prosthetic wear tolerance (days/week)  7 days  Current prosthetic wear tolerance (#hours/day)  90% of awake hours, including dialysis  Residual limb condition  intact     seated on stool in parallel bars: Min assist for  sit<>stand using parallel bars Forward trunk flexion then up into trunk extension x 10 reps Upper trunk rotation left<>right x 10 reps each way Lateral reaching toward floor with emphasis on not rotating x 10 reps each side          PT Short Term Goals - 09/16/15 1230    PT SHORT TERM GOAL #1   Title Patient sit to /from stand w/c to sink with moderate assist. (NEW Target Date: 08/30/2015)    Baseline MET 08/28/2015 to RW   Status Achieved   PT SHORT TERM GOAL #2   Title Patient & wife demonstrate / verbalize HEP / updated exercise program for fitness center. (Target Date:  07/26/2015)   Baseline 07/24/15: doing HEP on non dialysis days, seated ones every day and [facility] [place] per family.    Status Achieved   PT SHORT TERM GOAL #3   Title Patient ambulates 42' with RW & prostheses with moderate assist (2 people for safety) (Target Date: 07/26/2015)   Baseline 07/24/15: pt able to ambulate short distance with mod assist of 2 people, did not reach 40 feet goal   Status Partially Met   PT SHORT TERM GOAL #4   Title Patient tolerates 30 seconds of standing with RW with minA. (Target Date: 08/30/2015)   Baseline MET 08/28/2015    Time 1   Period Months   Status Achieved   PT SHORT TERM GOAL #5   Title Patient ambulates 33' with RW / prostheses & turns 90* to sit in chair with armrests with moderate assist (can have 2nd person for safety). Target Date: 08/30/2015   Baseline Partially MET 08/28/2015 Patient ambulates 15' with RW /prostheses & turns 90* to sit in chair with armrests with moderate assist (2nd person required for assist)   Time 1   Period Months   Status Partially Met   Additional Short Term Goals   Additional Short Term Goals Yes   PT SHORT TERM GOAL #6   Title Patient verbalizes standing & HEP compliance >/= 4 days/wk. (Target Date: 10/16/2015)   Time 1   Period Months   Status New   PT SHORT TERM GOAL #7   Title Patient ambulates 6' with RW & prostheses with wife's assist with PT supervising.  (Target Date: 10/16/2015)   Time 1   Period Months   Status New   PT SHORT TERM GOAL #8   Title Patient ambulates 23' with RW & prostheses with minA.  (Target Date: 10/16/2015)   Time 1   Period Months   Status New           PT Long Term Goals - 09/16/15 1145    PT LONG TERM GOAL #1   Title Patient and wife demonstrate proper prosthetic care. (Target Date: 09/20/2015)   Baseline MET 09/16/2015   Time 3   Period Months   Status Achieved   PT LONG TERM GOAL #2   Title Pt & wife verbalize & demonstrate ongoing HEP / fitness plan.  (Target  Date: 09/20/2015)  NEW Target DATE 11/15/2015   Baseline Patient is independent in current HEP but PT continues to update. 09/16/2015   Time 3   Period Months   Status Partially Met   PT LONG TERM GOAL #3   Title stand pivot transfers with rolling walker & prostheses with supervision.  (Target Date: 09/20/2015) NEW Target DATE 11/15/2015   Baseline NOT MET 09/16/2015 Patient flucuatates between [place] for stand pivot transfers.  Time 3   Period Months   Status On-going   PT LONG TERM GOAL #4   Title adjusts clothes & reaches 2" anteriorly while standing with RW support with minimal assist.  (Target Date: 09/20/2015)  NEW Target DATE 11/15/2015   Baseline PARTIALLY MET 09/16/2015 Patient reaches 2" anteriorly with minA but unable to adjust his clothes.    Time 3   Period Months   Status On-going   PT LONG TERM GOAL #5   Title ambulate 72' with rolling walker & prostheses with minimal assist.  (Target Date: 09/20/2015)  NEW Target DATE 11/15/2015   Baseline Partially MET 09/16/2015 Patient ambulates 26' with RW & prostheses with minA for straight pathes & modA to turn to position to sit.    Time 3   Period Months   Status Revised        09/20/15 1238  Plan  Clinical Impression Statement Skilled session continued to work on gait wth walker/prostheses with pt going farther distances today vs with previous sessions.Continued to work on balance and strengthening as well. Pt is making slow and steady progress toward goals.  Pt will benefit from skilled therapeutic intervention in order to improve on the following deficits Abnormal gait;Decreased activity tolerance;Decreased balance;Decreased endurance;Decreased mobility;Decreased range of motion;Decreased strength;Postural dysfunction;Prosthetic Dependency  Rehab Potential Good  PT Frequency 2x / week  PT Duration Other (comment) (9 weeks (60 days))  PT Treatment/Interventions ADLs/Self Care Home Management;DME Instruction;Gait training;Stair  training;Functional mobility training;Therapeutic activities;Therapeutic exercise;Balance training;Neuromuscular re-education;Patient/family education;Prosthetic Training  PT Next Visit Plan continue towards updated STGs & LTGs  Consulted and Agree with Plan of Care Patient;Family member/caregiver  Family Member Consulted wife      Problem List Patient Active Problem List   Diagnosis Date Noted  . Diarrhea 08/14/2014  . Dehydration 10/02/2013  . Fever 10/02/2013  . Altered mental status 10/01/2013  . Encephalopathy, toxic 10/01/2013  . Encephalopathy, metabolic 20/23/3435  . CVA (cerebral vascular accident) ([place]) 09/28/2013  . FTT (failure to thrive) in adult 09/28/2013  . Acute confusional state 09/28/2013  . Ulcer of sacral region, stage 3 ([place]) 09/26/2013  . S/P BKA (below knee amputation) bilateral ([place]) 09/08/2013  . ESRD on hemodialysis ([place]) 05/09/2013  . TIA (transient ischemic attack) 02/20/2013  . Acute blood loss anemia 10/28/2012  . Chronic combined systolic (EF 68%) and grade 2diastolic congestive heart failure 10/28/2012  . Obstipation 10/28/2012  . GI bleed 10/27/2012  . Mass in rectum 10/27/2012  . DM (diabetes mellitus) type I controlled with renal manifestation ([place]) 09/08/2012  . LVH (left ventricular hypertrophy)-severe concentric 06/13/2012  . Anemia due to chronic illness 06/12/2012  . Hyperlipidemia 01/30/2011  . CHOLELITHIASIS 08/01/2010  . BENIGN PROSTATIC HYPERTROPHY 08/01/2010  . CEREBROVASCULAR ACCIDENT, HX OF 08/06/2009  . Depression 03/18/2009  . Acute combined systolic and diastolic heart failure, NYHA class 2-EF 45% 03/18/2009  . NEPHROLITHIASIS, HX OF 03/18/2009  . Morbid obesity ([place]) 03/25/2007  . Essential hypertension 03/25/2007  . GERD 03/25/2007  . Chronic hepatitis C without hepatic coma ([place]) 03/25/2007    Willow Ora 09/22/2015, 11:04 PM  Willow Ora, PTA, [place] [address] [place], [place] [address] [phone] 09/22/2015, 11:04 PM   Name: Oscar Castillo MRN: [id number] Date of Birth: [birth year]

## 2015-09-24 DIAGNOSIS — Z23 Encounter for immunization: Secondary | ICD-10-CM | POA: Diagnosis not present

## 2015-09-24 DIAGNOSIS — Z992 Dependence on renal dialysis: Secondary | ICD-10-CM | POA: Diagnosis not present

## 2015-09-24 DIAGNOSIS — N186 End stage renal disease: Secondary | ICD-10-CM | POA: Diagnosis not present

## 2015-09-24 DIAGNOSIS — N2581 Secondary hyperparathyroidism of renal origin: Secondary | ICD-10-CM | POA: Diagnosis not present

## 2015-09-24 DIAGNOSIS — E1122 Type 2 diabetes mellitus with diabetic chronic kidney disease: Secondary | ICD-10-CM | POA: Diagnosis not present

## 2015-09-25 ENCOUNTER — Ambulatory Visit: Payer: Medicare Other | Attending: Internal Medicine | Admitting: Physical Therapy

## 2015-09-25 ENCOUNTER — Encounter: Payer: Self-pay | Admitting: Physical Therapy

## 2015-09-25 DIAGNOSIS — R269 Unspecified abnormalities of gait and mobility: Secondary | ICD-10-CM | POA: Diagnosis not present

## 2015-09-25 DIAGNOSIS — Z7409 Other reduced mobility: Secondary | ICD-10-CM | POA: Diagnosis not present

## 2015-09-25 DIAGNOSIS — M24669 Ankylosis, unspecified knee: Secondary | ICD-10-CM | POA: Diagnosis not present

## 2015-09-25 DIAGNOSIS — R6889 Other general symptoms and signs: Secondary | ICD-10-CM | POA: Diagnosis not present

## 2015-09-25 DIAGNOSIS — R29818 Other symptoms and signs involving the nervous system: Secondary | ICD-10-CM | POA: Diagnosis not present

## 2015-09-25 DIAGNOSIS — R4189 Other symptoms and signs involving cognitive functions and awareness: Secondary | ICD-10-CM | POA: Insufficient documentation

## 2015-09-25 DIAGNOSIS — R2991 Unspecified symptoms and signs involving the musculoskeletal system: Secondary | ICD-10-CM

## 2015-09-25 DIAGNOSIS — Z89511 Acquired absence of right leg below knee: Secondary | ICD-10-CM

## 2015-09-25 DIAGNOSIS — R531 Weakness: Secondary | ICD-10-CM | POA: Insufficient documentation

## 2015-09-25 DIAGNOSIS — M79641 Pain in right hand: Secondary | ICD-10-CM

## 2015-09-25 DIAGNOSIS — Z89512 Acquired absence of left leg below knee: Secondary | ICD-10-CM | POA: Diagnosis not present

## 2015-09-25 DIAGNOSIS — R5381 Other malaise: Secondary | ICD-10-CM | POA: Insufficient documentation

## 2015-09-25 DIAGNOSIS — R2689 Other abnormalities of gait and mobility: Secondary | ICD-10-CM

## 2015-09-25 NOTE — Therapy (Signed)
[facility] [address] [place], [place], [address] Phone: [phone]   Fax:  [phone]  Physical Therapy Treatment  Patient Details  Name: Oscar Castillo MRN: [id number] Date of Birth: April 22, 1951 Referring Provider: [name], MD  Encounter Date: 09/25/2015      PT End of Session - 09/25/15 1328    Visit Number 24   Number of Visits 40   Date for PT Re-Evaluation 11/15/15   Authorization Type Medicare Do G-Code & progress reports every 10 visits   PT Start Time 1233   PT Stop Time 1315   PT Time Calculation (min) 42 min   Equipment Utilized During Treatment Gait belt   Activity Tolerance Patient tolerated treatment well;Patient limited by pain   Behavior During Therapy [facility] for tasks assessed/performed      Past Medical History  Diagnosis Date  . ESRD on hemodialysis ([place]) 05/05/2007    ESRD due to DM/HTN. Started dialysis in November 2013.  HD TTS at [facility] on [place].  [name] BACK PAIN, LUMBAR, CHRONIC 08/06/2009  . BENIGN PROSTATIC HYPERTROPHY 08/01/2010  . CEREBROVASCULAR ACCIDENT, HX OF 08/06/2009  . CHOLELITHIASIS 08/01/2010  . CONGESTIVE HEART FAILURE 03/18/2009  . DEPRESSION 03/18/2009  . DIABETES MELLITUS, TYPE II 03/25/2007  . ERECTILE DYSFUNCTION 03/25/2007  . GERD 03/25/2007  . HEPATITIS C, HX OF 03/25/2007  . HYPERTENSION 03/25/2007  . Morbid obesity ([place]) 03/25/2007  . NEPHROLITHIASIS, HX OF 03/18/2009  . Complication of anesthesia     wife states pt had trouble waking up with his last surgery in Nov., 2014  . History of Clostridium difficile   . Hemorrhoids   . Anemia   . Antral ulcer 2014    small    Past Surgical History  Procedure Laterality Date  . Nephrectomy      partial RR  . Av fistula placement  06/14/2012    Procedure: ARTERIOVENOUS (AV) FISTULA CREATION;  Surgeon: Angelia Mould, MD;  Location: [facility] OR;  Service: Vascular;  Laterality: Left;  Left basilic vein transposition  with fistula.  . Tibia im nail insertion Left 09/09/2012    Procedure: INTRAMEDULLARY (IM) NAIL TIBIAL;  Surgeon: Johnny Bridge, MD;  Location: [facility];  Service: Orthopedics;  Laterality: Left;  left tibial nail and open reduction internal fixation left fibula fracture  . Orif fibula fracture Left 09/09/2012    Procedure: OPEN REDUCTION INTERNAL FIXATION (ORIF) FIBULA FRACTURE;  Surgeon: Johnny Bridge, MD;  Location: [facility];  Service: Orthopedics;  Laterality: Left;  . Colonoscopy N/A 10/28/2012    Procedure: COLONOSCOPY;  Surgeon: Jeryl Columbia, MD;  Location: [facility] ENDOSCOPY;  Service: Endoscopy;  Laterality: N/A;  . Esophagogastroduodenoscopy N/A 11/02/2012    Procedure: ESOPHAGOGASTRODUODENOSCOPY (EGD);  Surgeon: Cleotis Nipper, MD;  Location: [facility] ENDOSCOPY;  Service: Endoscopy;  Laterality: N/A;  . Colonoscopy N/A 11/02/2012    Procedure: COLONOSCOPY;  Surgeon: Cleotis Nipper, MD;  Location: [facility] ENDOSCOPY;  Service: Endoscopy;  Laterality: N/A;  . Colonoscopy N/A 11/03/2012    Procedure: COLONOSCOPY;  Surgeon: Cleotis Nipper, MD;  Location: [facility] ENDOSCOPY;  Service: Endoscopy;  Laterality: N/A;  . Givens capsule study N/A 11/04/2012    Procedure: GIVENS CAPSULE STUDY;  Surgeon: Cleotis Nipper, MD;  Location: [facility] ENDOSCOPY;  Service: Endoscopy;  Laterality: N/A;  . Enteroscopy N/A 11/08/2012    Procedure: ENTEROSCOPY;  Surgeon: Wonda Horner, MD;  Location: [facility] ENDOSCOPY;  Service: Endoscopy;  Laterality: N/A;  .  Amputation Left 05/12/2013    Procedure: AMPUTATION RAY;  Surgeon: Newt Minion, MD;  Location: [facility];  Service: Orthopedics;  Laterality: Left;  Left Foot 1st Ray Amputation  . Eye surgery Left     to remove scar tissue  . Amputation Left 06/09/2013    Procedure: AMPUTATION BELOW KNEE;  Surgeon: Newt Minion, MD;  Location: [facility];  Service: Orthopedics;  Laterality: Left;  Left Below Knee Amputation and removal proximal screws IM tibial nail  . Hardware removal Left 06/09/2013     Procedure: HARDWARE REMOVAL;  Surgeon: Newt Minion, MD;  Location: [facility];  Service: Orthopedics;  Laterality: Left;  Left Below Knee Amputation  and Removal proximal screws IM tibial nail  . Amputation Right 09/08/2013    Procedure: AMPUTATION BELOW KNEE;  Surgeon: Newt Minion, MD;  Location: [place];  Service: Orthopedics;  Laterality: Right;  Right Below Knee Amputation  . Amputation Right 10/11/2013    Procedure: AMPUTATION BELOW KNEE;  Surgeon: Newt Minion, MD;  Location: [place];  Service: Orthopedics;  Laterality: Right;  Right Below Knee Amputation Revision    There were no vitals filed for this visit.  Visit Diagnosis:  Generalized weakness  Balance problems  Impaired transfers  Impaired functional mobility and activity tolerance  Pain, hand joint, right  Status post bilateral below knee amputation (HCC)      Subjective Assessment - 09/25/15 1233    Subjective No falls. They purchased a chair with armrests. He has been standing at walker & sink.    Currently in Pain? Yes   Pain Score 4    Pain Location Hand   Pain Orientation Right;Left   Pain Descriptors / Indicators Sore;Tightness   Pain Type Chronic pain   Pain Onset More than a month ago   Pain Frequency Constant   Aggravating Factors  dialysis   Pain Relieving Factors medications                         [facility] Adult PT Treatment/Exercise - 09/25/15 1230    Transfers   Sit to Stand 3: Mod assist;With upper extremity assist;With armrests;From chair/3-in-1;4: Min assist  2 person assist   Sit to Stand Details Verbal cues for sequencing;Verbal cues for technique;Tactile cues for weight shifting;Tactile cues for posture;Tactile cues for sequencing;Manual facilitation for weight shifting   Sit to Stand Details (indicate cue type and reason) 1st - 3rd time MinA 4th-8th time ModA    Stand to Sit 3: Mod assist;With upper extremity assist;To chair/3-in-1;Uncontrolled descent   Stand to Sit Details  (indicate cue type and reason) Verbal cues for precautions/safety;Verbal cues for technique;Verbal cues for sequencing;Manual facilitation for weight shifting;Tactile cues for weight shifting;Tactile cues for posture;Tactile cues for sequencing   Stand to Sit Details PT demo, instructed to bow to lower hands prior to sitting down   Stand Pivot Transfers 3: Mod assist  with RW   Ambulation/Gait   Ambulation/Gait Yes   Ambulation/Gait Assistance 3: Mod assist  of 2 people for safety, 2cd only needed toward end of each   Ambulation/Gait Assistance Details ModA on straight path, MaxA with turns, Worked on wife assisting using theraband to keep w/c close with goal to not need 2nd person. PT instructed to not reach across w/c to lock far brake due to risk of him sitting on her arm. PT instructed to lock close side brake and hold w/c handle on far side to control w/c  while sitting. Pt & wife verbalized understanding.    Ambulation Distance (Feet) 40 Feet  40', 20', 5', 2', 3'   Assistive device Prostheses;Rolling walker   Gait Pattern Decreased step length - right;Decreased step length - left;Decreased stride length;Right flexed knee in stance;Left flexed knee in stance;Trunk flexed;Narrow base of support   Ambulation Surface Indoor;Level   Knee/Hip Exercises: Aerobic   Nustep Level 3 with 4 extremities for 8 min with cues on full ROM   Prosthetics   Current prosthetic wear tolerance (days/week)  7 days   Current prosthetic wear tolerance (#hours/day)  90% of awake hours, including dialysis   Residual limb condition  intact                 PT Education - 09/25/15 1325    Education provided Yes   Education Details how to safely assist patient, plans to start only with straight pathes with w/c banded to RW; If patient is collapsing, then she would slow, lower him to floor and call fire department to assist up. PT not recommending any walking at home yet.    Person(s) Educated Patient;Spouse    Methods Explanation;Demonstration;Tactile cues;Verbal cues   Comprehension Verbalized understanding;Verbal cues required;Tactile cues required;Need further instruction          PT Short Term Goals - 09/16/15 1230    PT SHORT TERM GOAL #1   Title Patient sit to /from stand w/c to sink with moderate assist. (NEW Target Date: 08/30/2015)    Baseline MET 08/28/2015 to RW   Status Achieved   PT SHORT TERM GOAL #2   Title Patient & wife demonstrate / verbalize HEP / updated exercise program for fitness center. (Target Date: 07/26/2015)   Baseline 07/24/15: doing HEP on non dialysis days, seated ones every day and [facility] on [place] per family.    Status Achieved   PT SHORT TERM GOAL #3   Title Patient ambulates 70' with RW & prostheses with moderate assist (2 people for safety) (Target Date: 07/26/2015)   Baseline 07/24/15: pt able to ambulate short distance with mod assist of 2 people, did not reach 40 feet goal   Status Partially Met   PT SHORT TERM GOAL #4   Title Patient tolerates 30 seconds of standing with RW with minA. (Target Date: 08/30/2015)   Baseline MET 08/28/2015    Time 1   Period Months   Status Achieved   PT SHORT TERM GOAL #5   Title Patient ambulates 75' with RW / prostheses & turns 90* to sit in chair with armrests with moderate assist (can have 2nd person for safety). Target Date: 08/30/2015   Baseline Partially MET 08/28/2015 Patient ambulates 15' with RW /prostheses & turns 90* to sit in chair with armrests with moderate assist (2nd person required for assist)   Time 1   Period Months   Status Partially Met   Additional Short Term Goals   Additional Short Term Goals Yes   PT SHORT TERM GOAL #6   Title Patient verbalizes standing & HEP compliance >/= 4 days/wk. (Target Date: 10/16/2015)   Time 1   Period Months   Status New   PT SHORT TERM GOAL #7   Title Patient ambulates 44' with RW & prostheses with wife's assist with PT supervising.  (Target Date: 10/16/2015)    Time 1   Period Months   Status New   PT SHORT TERM GOAL #8   Title Patient ambulates 53' with RW & prostheses with  minA.  (Target Date: 10/16/2015)   Time 1   Period Months   Status New           PT Long Term Goals - 09/16/15 1145    PT LONG TERM GOAL #1   Title Patient and wife demonstrate proper prosthetic care. (Target Date: 09/20/2015)   Baseline MET 09/16/2015   Time 3   Period Months   Status Achieved   PT LONG TERM GOAL #2   Title Pt & wife verbalize & demonstrate ongoing HEP / fitness plan.  (Target Date: 09/20/2015)  NEW Target DATE 11/15/2015   Baseline Patient is independent in current HEP but PT continues to update. 09/16/2015   Time 3   Period Months   Status Partially Met   PT LONG TERM GOAL #3   Title stand pivot transfers with rolling walker & prostheses with supervision.  (Target Date: 09/20/2015) NEW Target DATE 11/15/2015   Baseline NOT MET 09/16/2015 Patient flucuatates between [place] for stand pivot transfers.    Time 3   Period Months   Status On-going   PT LONG TERM GOAL #4   Title adjusts clothes & reaches 2" anteriorly while standing with RW support with minimal assist.  (Target Date: 09/20/2015)  NEW Target DATE 11/15/2015   Baseline PARTIALLY MET 09/16/2015 Patient reaches 2" anteriorly with minA but unable to adjust his clothes.    Time 3   Period Months   Status On-going   PT LONG TERM GOAL #5   Title ambulate 102' with rolling walker & prostheses with minimal assist.  (Target Date: 09/20/2015)  NEW Target DATE 11/15/2015   Baseline Partially MET 09/16/2015 Patient ambulates 26' with RW & prostheses with minA for straight pathes & modA to turn to position to sit.    Time 3   Period Months   Status Revised               Plan - 09/25/15 1329    Clinical Impression Statement Patient's lower extremities fatigued more suddenly today making safety concerns. Today was one of patient's less energy, strength days from dialysis and he still did more  than a few weeks ago on "good" day.    Pt will benefit from skilled therapeutic intervention in order to improve on the following deficits Abnormal gait;Decreased activity tolerance;Decreased balance;Decreased endurance;Decreased mobility;Decreased range of motion;Decreased strength;Postural dysfunction;Prosthetic Dependency   Rehab Potential Good   PT Frequency 2x / week   PT Duration Other (comment)  9 weeks (60 days)   PT Treatment/Interventions ADLs/Self Care Home Management;DME Instruction;Gait training;Stair training;Functional mobility training;Therapeutic activities;Therapeutic exercise;Balance training;Neuromuscular re-education;Patient/family education;Prosthetic Training   PT Next Visit Plan continue towards updated STGs & LTGs   Consulted and Agree with Plan of Care Patient;Family member/caregiver   Family Member Consulted wife        Problem List Patient Active Problem List   Diagnosis Date Noted  . Diarrhea 08/14/2014  . Dehydration 10/02/2013  . Fever 10/02/2013  . Altered mental status 10/01/2013  . Encephalopathy, toxic 10/01/2013  . Encephalopathy, metabolic 01/20/9484  . CVA (cerebral vascular accident) ([place]) 09/28/2013  . FTT (failure to thrive) in adult 09/28/2013  . Acute confusional state 09/28/2013  . Ulcer of sacral region, stage 3 ([place]) 09/26/2013  . S/P BKA (below knee amputation) bilateral ([place]) 09/08/2013  . ESRD on hemodialysis ([place]) 05/09/2013  . TIA (transient ischemic attack) 02/20/2013  . Acute blood loss anemia 10/28/2012  . Chronic combined systolic (EF 46%) and grade 2diastolic  congestive heart failure 10/28/2012  . Obstipation 10/28/2012  . GI bleed 10/27/2012  . Mass in rectum 10/27/2012  . DM (diabetes mellitus) type I controlled with renal manifestation ([place]) 09/08/2012  . LVH (left ventricular hypertrophy)-severe concentric 06/13/2012  . Anemia due to chronic illness 06/12/2012  . Hyperlipidemia 01/30/2011  . CHOLELITHIASIS 08/01/2010   . BENIGN PROSTATIC HYPERTROPHY 08/01/2010  . CEREBROVASCULAR ACCIDENT, HX OF 08/06/2009  . Depression 03/18/2009  . Acute combined systolic and diastolic heart failure, NYHA class 2-EF 45% 03/18/2009  . NEPHROLITHIASIS, HX OF 03/18/2009  . Morbid obesity ([place]) 03/25/2007  . Essential hypertension 03/25/2007  . GERD 03/25/2007  . Chronic hepatitis C without hepatic coma ([place]) 03/25/2007    Tylon Kemmerling PT, DPT 09/25/2015, 1:32 PM  [facility] [address] [place], [place], [address] Phone: [phone]   Fax:  [phone]  Name: Oscar Castillo MRN: [id number] Date of Birth: 15-May-1951

## 2015-09-26 DIAGNOSIS — E1122 Type 2 diabetes mellitus with diabetic chronic kidney disease: Secondary | ICD-10-CM | POA: Diagnosis not present

## 2015-09-26 DIAGNOSIS — N2581 Secondary hyperparathyroidism of renal origin: Secondary | ICD-10-CM | POA: Diagnosis not present

## 2015-09-26 DIAGNOSIS — D631 Anemia in chronic kidney disease: Secondary | ICD-10-CM | POA: Diagnosis not present

## 2015-09-26 DIAGNOSIS — N186 End stage renal disease: Secondary | ICD-10-CM | POA: Diagnosis not present

## 2015-09-27 ENCOUNTER — Ambulatory Visit: Payer: Medicare Other | Admitting: Physical Therapy

## 2015-09-27 ENCOUNTER — Encounter: Payer: Self-pay | Admitting: Physical Therapy

## 2015-09-27 DIAGNOSIS — Z89512 Acquired absence of left leg below knee: Secondary | ICD-10-CM | POA: Diagnosis not present

## 2015-09-27 DIAGNOSIS — R2991 Unspecified symptoms and signs involving the musculoskeletal system: Secondary | ICD-10-CM

## 2015-09-27 DIAGNOSIS — R6889 Other general symptoms and signs: Secondary | ICD-10-CM

## 2015-09-27 DIAGNOSIS — R531 Weakness: Secondary | ICD-10-CM

## 2015-09-27 DIAGNOSIS — R5381 Other malaise: Secondary | ICD-10-CM

## 2015-09-27 DIAGNOSIS — Z89511 Acquired absence of right leg below knee: Secondary | ICD-10-CM

## 2015-09-27 DIAGNOSIS — Z7409 Other reduced mobility: Secondary | ICD-10-CM | POA: Diagnosis not present

## 2015-09-27 DIAGNOSIS — R29818 Other symptoms and signs involving the nervous system: Secondary | ICD-10-CM | POA: Diagnosis not present

## 2015-09-27 DIAGNOSIS — R2689 Other abnormalities of gait and mobility: Secondary | ICD-10-CM

## 2015-09-27 DIAGNOSIS — R269 Unspecified abnormalities of gait and mobility: Secondary | ICD-10-CM

## 2015-09-27 DIAGNOSIS — M79641 Pain in right hand: Secondary | ICD-10-CM

## 2015-09-27 NOTE — Therapy (Signed)
[facility] [address] [place], [place], [address] Phone: [phone]   Fax:  [phone]  Physical Therapy Treatment  Patient Details  Name: CORDE ANTONINI MRN: [id number] Date of Birth: [birth year] Referring Provider: [name], MD  Encounter Date: 09/27/2015      PT End of Session - 09/27/15 1238    Visit Number 25   Number of Visits 40   Date for PT Re-Evaluation 11/15/15   Authorization Type Medicare Do G-Code & progress reports every 10 visits   PT Start Time 0109   PT Stop Time 1315   PT Time Calculation (min) 40 min   Equipment Utilized During Treatment Gait belt   Activity Tolerance Patient tolerated treatment well;Patient limited by pain   Behavior During Therapy [facility] for tasks assessed/performed      Past Medical History  Diagnosis Date  . ESRD on hemodialysis ([place]) 05/05/2007    ESRD due to DM/HTN. Started dialysis in November 2013.  HD TTS at [facility] on [place].  [name] BACK PAIN, LUMBAR, CHRONIC 08/06/2009  . BENIGN PROSTATIC HYPERTROPHY 08/01/2010  . CEREBROVASCULAR ACCIDENT, HX OF 08/06/2009  . CHOLELITHIASIS 08/01/2010  . CONGESTIVE HEART FAILURE 03/18/2009  . DEPRESSION 03/18/2009  . DIABETES MELLITUS, TYPE II 03/25/2007  . ERECTILE DYSFUNCTION 03/25/2007  . GERD 03/25/2007  . HEPATITIS C, HX OF 03/25/2007  . HYPERTENSION 03/25/2007  . Morbid obesity ([place]) 03/25/2007  . NEPHROLITHIASIS, HX OF 03/18/2009  . Complication of anesthesia     wife states pt had trouble waking up with his last surgery in Nov., 2014  . History of Clostridium difficile   . Hemorrhoids   . Anemia   . Antral ulcer 2014    small    Past Surgical History  Procedure Laterality Date  . Nephrectomy      partial RR  . Av fistula placement  06/14/2012    Procedure: ARTERIOVENOUS (AV) FISTULA CREATION;  Surgeon: Angelia Mould, MD;  Location: [facility] OR;  Service: Vascular;  Laterality: Left;  Left basilic vein transposition  with fistula.  . Tibia im nail insertion Left 09/09/2012    Procedure: INTRAMEDULLARY (IM) NAIL TIBIAL;  Surgeon: Johnny Bridge, MD;  Location: [facility];  Service: Orthopedics;  Laterality: Left;  left tibial nail and open reduction internal fixation left fibula fracture  . Orif fibula fracture Left 09/09/2012    Procedure: OPEN REDUCTION INTERNAL FIXATION (ORIF) FIBULA FRACTURE;  Surgeon: Johnny Bridge, MD;  Location: [facility];  Service: Orthopedics;  Laterality: Left;  . Colonoscopy N/A 10/28/2012    Procedure: COLONOSCOPY;  Surgeon: Jeryl Columbia, MD;  Location: [facility] ENDOSCOPY;  Service: Endoscopy;  Laterality: N/A;  . Esophagogastroduodenoscopy N/A 11/02/2012    Procedure: ESOPHAGOGASTRODUODENOSCOPY (EGD);  Surgeon: Cleotis Nipper, MD;  Location: [facility] ENDOSCOPY;  Service: Endoscopy;  Laterality: N/A;  . Colonoscopy N/A 11/02/2012    Procedure: COLONOSCOPY;  Surgeon: Cleotis Nipper, MD;  Location: [facility] ENDOSCOPY;  Service: Endoscopy;  Laterality: N/A;  . Colonoscopy N/A 11/03/2012    Procedure: COLONOSCOPY;  Surgeon: Cleotis Nipper, MD;  Location: [facility] ENDOSCOPY;  Service: Endoscopy;  Laterality: N/A;  . Givens capsule study N/A 11/04/2012    Procedure: GIVENS CAPSULE STUDY;  Surgeon: Cleotis Nipper, MD;  Location: [facility] ENDOSCOPY;  Service: Endoscopy;  Laterality: N/A;  . Enteroscopy N/A 11/08/2012    Procedure: ENTEROSCOPY;  Surgeon: Wonda Horner, MD;  Location: [facility] ENDOSCOPY;  Service: Endoscopy;  Laterality: N/A;  .  Amputation Left 05/12/2013    Procedure: AMPUTATION RAY;  Surgeon: Newt Minion, MD;  Location: [facility];  Service: Orthopedics;  Laterality: Left;  Left Foot 1st Ray Amputation  . Eye surgery Left     to remove scar tissue  . Amputation Left 06/09/2013    Procedure: AMPUTATION BELOW KNEE;  Surgeon: Newt Minion, MD;  Location: [facility];  Service: Orthopedics;  Laterality: Left;  Left Below Knee Amputation and removal proximal screws IM tibial nail  . Hardware removal Left 06/09/2013     Procedure: HARDWARE REMOVAL;  Surgeon: Newt Minion, MD;  Location: [facility];  Service: Orthopedics;  Laterality: Left;  Left Below Knee Amputation  and Removal proximal screws IM tibial nail  . Amputation Right 09/08/2013    Procedure: AMPUTATION BELOW KNEE;  Surgeon: Newt Minion, MD;  Location: [place] [facility];  Service: Orthopedics;  Laterality: Right;  Right Below Knee Amputation  . Amputation Right 10/11/2013    Procedure: AMPUTATION BELOW KNEE;  Surgeon: Newt Minion, MD;  Location: [facility];  Service: Orthopedics;  Laterality: Right;  Right Below Knee Amputation Revision    There were no vitals filed for this visit.  Visit Diagnosis:  Generalized weakness  Balance problems  Impaired transfers  Impaired functional mobility and activity tolerance  Pain, hand joint, right  Status post bilateral below knee amputation (HCC)  Abnormality of gait  Decreased functional activity tolerance  Debility      Subjective Assessment - 09/27/15 1236    Subjective No new complaints. Hands are hurting today. No falls to report.   Patient is accompained by: Family member  spouse   Patient Stated Goals To be able to walk more & hopefully by himself with prostheses   Currently in Pain? Yes   Pain Score 3    Pain Location Hand   Pain Orientation Right;Left   Pain Descriptors / Indicators Sore;Tightness   Pain Type Chronic pain   Pain Onset More than a month ago   Pain Frequency Constant   Aggravating Factors  dialysis   Pain Relieving Factors medications              [facility] Adult PT Treatment/Exercise - 09/27/15 1238    Transfers   Sit to Stand 3: Mod assist;With upper extremity assist;With armrests;From chair/3-in-1;4: Min assist   Sit to Stand Details Verbal cues for sequencing;Verbal cues for technique;Tactile cues for weight shifting;Tactile cues for posture;Tactile cues for sequencing;Manual facilitation for weight shifting   Stand to Sit 3: Mod assist;With upper extremity assist;To  chair/3-in-1;Uncontrolled descent   Stand to Sit Details (indicate cue type and reason) Verbal cues for precautions/safety;Verbal cues for technique;Verbal cues for sequencing;Manual facilitation for weight shifting;Tactile cues for weight shifting;Tactile cues for posture;Tactile cues for sequencing   Ambulation/Gait   Ambulation/Gait Yes   Ambulation/Gait Assistance 3: Mod assist;2: Max assist  of 2 people, increased assist at end of 1st gait needing max   Ambulation Distance (Feet) 25 Feet  x1, 40 x1   Assistive device Prostheses;Rolling walker   Gait Pattern Decreased step length - right;Decreased step length - left;Decreased stride length;Right flexed knee in stance;Left flexed knee in stance;Trunk flexed;Narrow base of support   Ambulation Surface Level;Indoor   Prosthetics   Current prosthetic wear tolerance (days/week)  7 days   Current prosthetic wear tolerance (#hours/day)  90% of awake hours, including dialysis   Residual limb condition  intact      seated on lower stool in parallel bars with  pillow on stool for increased comfort: partial curl ups x 20 reps, forward flexion up into trunk extension x 15 reps, lateral trunk flexion x 15 to each side and upper trunk rotation with arms crossed over chest x 10 to each side.        PT Short Term Goals - 09/16/15 1230    PT SHORT TERM GOAL #1   Title Patient sit to /from stand w/c to sink with moderate assist. (NEW Target Date: 08/30/2015)    Baseline MET 08/28/2015 to RW   Status Achieved   PT SHORT TERM GOAL #2   Title Patient & wife demonstrate / verbalize HEP / updated exercise program for fitness center. (Target Date: 07/26/2015)   Baseline 07/24/15: doing HEP on non dialysis days, seated ones every day and [facility] on [place] per family.    Status Achieved   PT SHORT TERM GOAL #3   Title Patient ambulates 62' with RW & prostheses with moderate assist (2 people for safety) (Target Date: 07/26/2015)   Baseline 07/24/15: pt  able to ambulate short distance with mod assist of 2 people, did not reach 40 feet goal   Status Partially Met   PT SHORT TERM GOAL #4   Title Patient tolerates 30 seconds of standing with RW with minA. (Target Date: 08/30/2015)   Baseline MET 08/28/2015    Time 1   Period Months   Status Achieved   PT SHORT TERM GOAL #5   Title Patient ambulates 15' with RW / prostheses & turns 90* to sit in chair with armrests with moderate assist (can have 2nd person for safety). Target Date: 08/30/2015   Baseline Partially MET 08/28/2015 Patient ambulates 15' with RW /prostheses & turns 90* to sit in chair with armrests with moderate assist (2nd person required for assist)   Time 1   Period Months   Status Partially Met   Additional Short Term Goals   Additional Short Term Goals Yes   PT SHORT TERM GOAL #6   Title Patient verbalizes standing & HEP compliance >/= 4 days/wk. (Target Date: 10/16/2015)   Time 1   Period Months   Status New   PT SHORT TERM GOAL #7   Title Patient ambulates 4' with RW & prostheses with wife's assist with PT supervising.  (Target Date: 10/16/2015)   Time 1   Period Months   Status New   PT SHORT TERM GOAL #8   Title Patient ambulates 38' with RW & prostheses with minA.  (Target Date: 10/16/2015)   Time 1   Period Months   Status New           PT Long Term Goals - 09/16/15 1145    PT LONG TERM GOAL #1   Title Patient and wife demonstrate proper prosthetic care. (Target Date: 09/20/2015)   Baseline MET 09/16/2015   Time 3   Period Months   Status Achieved   PT LONG TERM GOAL #2   Title Pt & wife verbalize & demonstrate ongoing HEP / fitness plan.  (Target Date: 09/20/2015)  NEW Target DATE 11/15/2015   Baseline Patient is independent in current HEP but PT continues to update. 09/16/2015   Time 3   Period Months   Status Partially Met   PT LONG TERM GOAL #3   Title stand pivot transfers with rolling walker & prostheses with supervision.  (Target Date: 09/20/2015) NEW  Target DATE 11/15/2015   Baseline NOT MET 09/16/2015 Patient flucuatates between [place] for  stand pivot transfers.    Time 3   Period Months   Status On-going   PT LONG TERM GOAL #4   Title adjusts clothes & reaches 2" anteriorly while standing with RW support with minimal assist.  (Target Date: 09/20/2015)  NEW Target DATE 11/15/2015   Baseline PARTIALLY MET 09/16/2015 Patient reaches 2" anteriorly with minA but unable to adjust his clothes.    Time 3   Period Months   Status On-going   PT LONG TERM GOAL #5   Title ambulate 29' with rolling walker & prostheses with minimal assist.  (Target Date: 09/20/2015)  NEW Target DATE 11/15/2015   Baseline Partially MET 09/16/2015 Patient ambulates 8' with RW & prostheses with minA for straight pathes & modA to turn to position to sit.    Time 3   Period Months   Status Revised            Plan - 09/27/15 1238    Clinical Impression Statement Skilled session continued to address gait with prostheses and strengthening. Pt with bil knees buckling during 1st gait trail, needed max to total assist to sit safety in wheelchair, this improved on 2cd gait trial. Pt is making slow, yet steady progress toward goals.   Pt will benefit from skilled therapeutic intervention in order to improve on the following deficits Abnormal gait;Decreased activity tolerance;Decreased balance;Decreased endurance;Decreased mobility;Decreased range of motion;Decreased strength;Postural dysfunction;Prosthetic Dependency   Rehab Potential Good   PT Frequency 2x / week   PT Duration Other (comment)  9 weeks (60 days)   PT Treatment/Interventions ADLs/Self Care Home Management;DME Instruction;Gait training;Stair training;Functional mobility training;Therapeutic activities;Therapeutic exercise;Balance training;Neuromuscular re-education;Patient/family education;Prosthetic Training   PT Next Visit Plan continue towards updated STGs & LTGs   Consulted and Agree with Plan of Care  Patient;Family member/caregiver   Family Member Consulted wife        Problem List Patient Active Problem List   Diagnosis Date Noted  . Diarrhea 08/14/2014  . Dehydration 10/02/2013  . Fever 10/02/2013  . Altered mental status 10/01/2013  . Encephalopathy, toxic 10/01/2013  . Encephalopathy, metabolic 38/75/6433  . CVA (cerebral vascular accident) ([place]) 09/28/2013  . FTT (failure to thrive) in adult 09/28/2013  . Acute confusional state 09/28/2013  . Ulcer of sacral region, stage 3 ([place]) 09/26/2013  . S/P BKA (below knee amputation) bilateral ([place]) 09/08/2013  . ESRD on hemodialysis ([place]) 05/09/2013  . TIA (transient ischemic attack) 02/20/2013  . Acute blood loss anemia 10/28/2012  . Chronic combined systolic (EF 29%) and grade 2diastolic congestive heart failure 10/28/2012  . Obstipation 10/28/2012  . GI bleed 10/27/2012  . Mass in rectum 10/27/2012  . DM (diabetes mellitus) type I controlled with renal manifestation ([place]) 09/08/2012  . LVH (left ventricular hypertrophy)-severe concentric 06/13/2012  . Anemia due to chronic illness 06/12/2012  . Hyperlipidemia 01/30/2011  . CHOLELITHIASIS 08/01/2010  . BENIGN PROSTATIC HYPERTROPHY 08/01/2010  . CEREBROVASCULAR ACCIDENT, HX OF 08/06/2009  . Depression 03/18/2009  . Acute combined systolic and diastolic heart failure, NYHA class 2-EF 45% 03/18/2009  . NEPHROLITHIASIS, HX OF 03/18/2009  . Morbid obesity ([place]) 03/25/2007  . Essential hypertension 03/25/2007  . GERD 03/25/2007  . Chronic hepatitis C without hepatic coma ([place]) 03/25/2007     Willow Ora, PTA, [facility] [address], [place], [place] [address] [phone] 09/28/2015, 3:02 PM   Name: URIAH PHILIPSON MRN: [id number] Date of Birth: Jul 21, 1951

## 2015-09-28 DIAGNOSIS — N2581 Secondary hyperparathyroidism of renal origin: Secondary | ICD-10-CM | POA: Diagnosis not present

## 2015-09-28 DIAGNOSIS — N186 End stage renal disease: Secondary | ICD-10-CM | POA: Diagnosis not present

## 2015-09-28 DIAGNOSIS — E1122 Type 2 diabetes mellitus with diabetic chronic kidney disease: Secondary | ICD-10-CM | POA: Diagnosis not present

## 2015-09-28 DIAGNOSIS — D631 Anemia in chronic kidney disease: Secondary | ICD-10-CM | POA: Diagnosis not present

## 2015-09-30 ENCOUNTER — Encounter: Payer: Self-pay | Admitting: Physical Therapy

## 2015-09-30 NOTE — Therapy (Signed)
[facility] [address] [place], [place], [address] Phone: [phone]   Fax:  [phone]  Patient Details  Name: Oscar Castillo MRN: [id number] Date of Birth: 04/08/1951 Referring Provider:    Encounter Date: 09/07/2015   Physical Therapy Progress Note  Dates of Reporting Period: 07/31/2015 to 09/06/2015  Objective Reports of Subjective Statement: Patient reports increased wear of prostheses & standing at home.   Objective Measurements: mod to max assist for sit<>stands w/<>walker. able to stand at walker for 82.97 sec's with min guard assist with upright posture with walker not stabilized at wall. 40 feet with walker with mod assist of 2 people for safety.  Goal Update:      PT Long Term Goals - 09/16/15 1145    PT LONG TERM GOAL #1   Title Patient and wife demonstrate proper prosthetic care. (Target Date: 09/20/2015)   Baseline MET 09/16/2015   Time 3   Period Months   Status Achieved   PT LONG TERM GOAL #2   Title Pt & wife verbalize & demonstrate ongoing HEP / fitness plan.  (Target Date: 09/20/2015)  NEW Target DATE 11/15/2015   Baseline Patient is independent in current HEP but PT continues to update. 09/16/2015   Time 3   Period Months   Status Partially Met   PT LONG TERM GOAL #3   Title stand pivot transfers with rolling walker & prostheses with supervision.  (Target Date: 09/20/2015) NEW Target DATE 11/15/2015   Baseline NOT MET 09/16/2015 Patient flucuatates between [place] for stand pivot transfers.    Time 3   Period Months   Status On-going   PT LONG TERM GOAL #4   Title adjusts clothes & reaches 2" anteriorly while standing with RW support with minimal assist.  (Target Date: 09/20/2015)  NEW Target DATE 11/15/2015   Baseline PARTIALLY MET 09/16/2015 Patient reaches 2" anteriorly with minA but unable to adjust his clothes.    Time 3   Period Months   Status On-going   PT LONG TERM GOAL #5   Title  ambulate 50' with rolling walker & prostheses with minimal assist.  (Target Date: 09/20/2015)  NEW Target DATE 11/15/2015   Baseline Partially MET 09/16/2015 Patient ambulates 33' with RW & prostheses with minA for straight pathes & modA to turn to position to sit.    Time 3   Period Months   Status Revised       Plan: continue established plan of care  Reason Skilled Services are Required: patient has potential to ambulate with his wife with further skilled PT.    Jamey Reas PT, DPT 09/30/2015, 12:31 PM  [facility] [address] [place], [place], [address] Phone: [phone]   Fax:  [phone]

## 2015-10-01 DIAGNOSIS — E1122 Type 2 diabetes mellitus with diabetic chronic kidney disease: Secondary | ICD-10-CM | POA: Diagnosis not present

## 2015-10-01 DIAGNOSIS — N186 End stage renal disease: Secondary | ICD-10-CM | POA: Diagnosis not present

## 2015-10-01 DIAGNOSIS — D631 Anemia in chronic kidney disease: Secondary | ICD-10-CM | POA: Diagnosis not present

## 2015-10-01 DIAGNOSIS — N2581 Secondary hyperparathyroidism of renal origin: Secondary | ICD-10-CM | POA: Diagnosis not present

## 2015-10-02 ENCOUNTER — Encounter: Payer: Self-pay | Admitting: Physical Therapy

## 2015-10-02 ENCOUNTER — Ambulatory Visit: Payer: Medicare Other | Admitting: Physical Therapy

## 2015-10-02 DIAGNOSIS — Z89512 Acquired absence of left leg below knee: Secondary | ICD-10-CM

## 2015-10-02 DIAGNOSIS — M79641 Pain in right hand: Secondary | ICD-10-CM | POA: Diagnosis not present

## 2015-10-02 DIAGNOSIS — Z89511 Acquired absence of right leg below knee: Secondary | ICD-10-CM

## 2015-10-02 DIAGNOSIS — Z7409 Other reduced mobility: Secondary | ICD-10-CM | POA: Diagnosis not present

## 2015-10-02 DIAGNOSIS — M24669 Ankylosis, unspecified knee: Secondary | ICD-10-CM

## 2015-10-02 DIAGNOSIS — R2991 Unspecified symptoms and signs involving the musculoskeletal system: Secondary | ICD-10-CM

## 2015-10-02 DIAGNOSIS — R531 Weakness: Secondary | ICD-10-CM

## 2015-10-02 DIAGNOSIS — R29818 Other symptoms and signs involving the nervous system: Secondary | ICD-10-CM | POA: Diagnosis not present

## 2015-10-02 DIAGNOSIS — R2689 Other abnormalities of gait and mobility: Secondary | ICD-10-CM

## 2015-10-02 DIAGNOSIS — R269 Unspecified abnormalities of gait and mobility: Secondary | ICD-10-CM

## 2015-10-02 DIAGNOSIS — R6889 Other general symptoms and signs: Secondary | ICD-10-CM

## 2015-10-03 DIAGNOSIS — E1122 Type 2 diabetes mellitus with diabetic chronic kidney disease: Secondary | ICD-10-CM | POA: Diagnosis not present

## 2015-10-03 DIAGNOSIS — N2581 Secondary hyperparathyroidism of renal origin: Secondary | ICD-10-CM | POA: Diagnosis not present

## 2015-10-03 DIAGNOSIS — N186 End stage renal disease: Secondary | ICD-10-CM | POA: Diagnosis not present

## 2015-10-03 DIAGNOSIS — D631 Anemia in chronic kidney disease: Secondary | ICD-10-CM | POA: Diagnosis not present

## 2015-10-03 NOTE — Therapy (Signed)
[facility] [address] [place], [place], [address] Phone: [phone]   Fax:  [phone]  Physical Therapy Treatment  Patient Details  Name: Oscar Castillo MRN: [id number] Date of Birth: 1951/03/30 Referring Provider: [name], MD  Encounter Date: 10/02/2015      PT End of Session - 10/02/15 1330    Visit Number 26   Number of Visits 40   Date for PT Re-Evaluation 11/15/15   Authorization Type Medicare Do G-Code & progress reports every 10 visits   PT Start Time 1233   PT Stop Time 1320   PT Time Calculation (min) 47 min   Equipment Utilized During Treatment Gait belt   Activity Tolerance Patient tolerated treatment well;Patient limited by pain   Behavior During Therapy [facility] for tasks assessed/performed      Past Medical History  Diagnosis Date  . ESRD on hemodialysis ([place]) 05/05/2007    ESRD due to DM/HTN. Started dialysis in November 2013.  HD TTS at [facility] on [address].  [name] BACK PAIN, LUMBAR, CHRONIC 08/06/2009  . BENIGN PROSTATIC HYPERTROPHY 08/01/2010  . CEREBROVASCULAR ACCIDENT, HX OF 08/06/2009  . CHOLELITHIASIS 08/01/2010  . CONGESTIVE HEART FAILURE 03/18/2009  . DEPRESSION 03/18/2009  . DIABETES MELLITUS, TYPE II 03/25/2007  . ERECTILE DYSFUNCTION 03/25/2007  . GERD 03/25/2007  . HEPATITIS C, HX OF 03/25/2007  . HYPERTENSION 03/25/2007  . Morbid obesity ([place]) 03/25/2007  . NEPHROLITHIASIS, HX OF 03/18/2009  . Complication of anesthesia     wife states pt had trouble waking up with his last surgery in Nov., 2014  . History of Clostridium difficile   . Hemorrhoids   . Anemia   . Antral ulcer 2014    small    Past Surgical History  Procedure Laterality Date  . Nephrectomy      partial RR  . Av fistula placement  06/14/2012    Procedure: ARTERIOVENOUS (AV) FISTULA CREATION;  Surgeon: Angelia Mould, MD;  [facility] OR;  Service: Vascular;  Laterality: Left;  Left basilic vein transposition  with fistula.  . Tibia im nail insertion Left 09/09/2012    Procedure: INTRAMEDULLARY (IM) NAIL TIBIAL;  Surgeon: Johnny Bridge, MD;  Location: [place];  Service: Orthopedics;  Laterality: Left;  left tibial nail and open reduction internal fixation left fibula fracture  . Orif fibula fracture Left 09/09/2012    Procedure: OPEN REDUCTION INTERNAL FIXATION (ORIF) FIBULA FRACTURE;  Surgeon: Johnny Bridge, MD;  Location: [place];  Service: Orthopedics;  Laterality: Left;  . Colonoscopy N/A 10/28/2012    Procedure: COLONOSCOPY;  Surgeon: Jeryl Columbia, MD;  Location: [facility] ENDOSCOPY;  Service: Endoscopy;  Laterality: N/A;  . Esophagogastroduodenoscopy N/A 11/02/2012    Procedure: ESOPHAGOGASTRODUODENOSCOPY (EGD);  Surgeon: Cleotis Nipper, MD;  Location: [facility] ENDOSCOPY;  Service: Endoscopy;  Laterality: N/A;  . Colonoscopy N/A 11/02/2012    Procedure: COLONOSCOPY;  Surgeon: Cleotis Nipper, MD;  Location: [facility] ENDOSCOPY;  Service: Endoscopy;  Laterality: N/A;  . Colonoscopy N/A 11/03/2012    Procedure: COLONOSCOPY;  Surgeon: Cleotis Nipper, MD;  Location: [facility] ENDOSCOPY;  Service: Endoscopy;  Laterality: N/A;  . Givens capsule study N/A 11/04/2012    Procedure: GIVENS CAPSULE STUDY;  Surgeon: Cleotis Nipper, MD;  Location: [facility] ENDOSCOPY;  Service: Endoscopy;  Laterality: N/A;  . Enteroscopy N/A 11/08/2012    Procedure: ENTEROSCOPY;  Surgeon: Wonda Horner, MD;  Location: [facility] ENDOSCOPY;  Service: Endoscopy;  Laterality: N/A;  .  Amputation Left 05/12/2013    Procedure: AMPUTATION RAY;  Surgeon: Newt Minion, MD;  Location: [place];  Service: Orthopedics;  Laterality: Left;  Left Foot 1st Ray Amputation  . Eye surgery Left     to remove scar tissue  . Amputation Left 06/09/2013    Procedure: AMPUTATION BELOW KNEE;  Surgeon: Newt Minion, MD;  Location: [place];  Service: Orthopedics;  Laterality: Left;  Left Below Knee Amputation and removal proximal screws IM tibial nail  . Hardware removal Left 06/09/2013     Procedure: HARDWARE REMOVAL;  Surgeon: Newt Minion, MD;  Location: [place];  Service: Orthopedics;  Laterality: Left;  Left Below Knee Amputation  and Removal proximal screws IM tibial nail  . Amputation Right 09/08/2013    Procedure: AMPUTATION BELOW KNEE;  Surgeon: Newt Minion, MD;  Location: [place];  Service: Orthopedics;  Laterality: Right;  Right Below Knee Amputation  . Amputation Right 10/11/2013    Procedure: AMPUTATION BELOW KNEE;  Surgeon: Newt Minion, MD;  Location: [place];  Service: Orthopedics;  Laterality: Right;  Right Below Knee Amputation Revision    There were no vitals filed for this visit.  Visit Diagnosis:  Generalized weakness  Balance problems  Impaired transfers  Impaired functional mobility and activity tolerance  Pain, hand joint, right  Status post bilateral below knee amputation (HCC)  Abnormality of gait  Decreased functional activity tolerance  Decreased range of knee movement, unspecified laterality      Subjective Assessment - 10/02/15 1236    Subjective Walked some with wife with friends to follow with w/c. no issues.   Patient is accompained by: Family member   Patient Stated Goals To be able to walk more & hopefully by himself with prostheses   Currently in Pain? Yes   Pain Score 3    Pain Location Hand   Pain Orientation Right;Left   Pain Descriptors / Indicators Sore;Tightness   Pain Type Chronic pain   Pain Onset More than a month ago   Pain Frequency Constant   Aggravating Factors  dialysis   Pain Relieving Factors medications                         [facility] Adult PT Treatment/Exercise - 10/02/15 1230    Transfers   Sit to Stand 4: Min assist;With upper extremity assist;With armrests;From chair/3-in-1   Sit to Stand Details Verbal cues for sequencing;Verbal cues for technique;Tactile cues for weight shifting;Tactile cues for posture;Tactile cues for sequencing;Manual facilitation for weight shifting   Stand to Sit  4: Min assist;With upper extremity assist;To chair/3-in-1;Uncontrolled descent;3: Mod assist  mInA if in position, modA if does not turn to position   Stand to Sit Details (indicate cue type and reason) Verbal cues for precautions/safety;Verbal cues for technique;Verbal cues for sequencing;Manual facilitation for weight shifting;Tactile cues for weight shifting;Tactile cues for posture;Tactile cues for sequencing   Ambulation/Gait   Ambulation/Gait Yes   Ambulation/Gait Assistance 3: Mod assist;2: Max assist  2nd person needed only in turns to position to sit    Ambulation/Gait Assistance Details decreased distance to work on turning 90* to position to sit. Verbal & tactile cues on posture.    Ambulation Distance (Feet) 25 Feet  10' X 4 with 90* turns, 25' straight path   Assistive device Prostheses;Rolling walker   Gait Pattern Decreased step length - right;Decreased step length - left;Decreased stride length;Right flexed knee in stance;Left flexed knee in stance;Trunk  flexed;Narrow base of support   Ambulation Surface Indoor;Level   Neuro Re-ed    Neuro Re-ed Details  Sitting at edge of chair upright posture without back or arm support with instruction to do at home also periodically during the day.  Seated with back support "zoom ball" X 77mnutes with tactile cues for upper back posture.    Prosthetics   Current prosthetic wear tolerance (days/week)  7 days   Current prosthetic wear tolerance (#hours/day)  90% of awake hours, including dialysis   Residual limb condition  intact                 PT Education - 10/02/15 1230    Education provided Yes   Education Details Sitting at edge of chair with prosthetic feet on ground without back or arm support.    Person(s) Educated Patient;Spouse   Methods Explanation;Demonstration;Verbal cues   Comprehension Verbalized understanding;Returned demonstration;Verbal cues required          PT Short Term Goals - 10/02/15 1330    PT  SHORT TERM GOAL #1   Title Patient sit to /from stand w/c to sink with moderate assist. (NEW Target Date: 08/30/2015)    Baseline MET 08/28/2015 to RW   Status Achieved   PT SHORT TERM GOAL #2   Title Patient & wife demonstrate / verbalize HEP / updated exercise program for fitness center. (Target Date: 07/26/2015)   Baseline 07/24/15: doing HEP on non dialysis days, seated ones every day and [facility].    Status Achieved   PT SHORT TERM GOAL #3   Title Patient ambulates 435 with RW & prostheses with moderate assist (2 people for safety) (Target Date: 07/26/2015)   Baseline 07/24/15: pt able to ambulate short distance with mod assist of 2 people, did not reach 40 feet goal   Status Partially Met   PT SHORT TERM GOAL #4   Title Patient tolerates 30 seconds of standing with RW with minA. (Target Date: 08/30/2015)   Baseline MET 08/28/2015    Time 1   Period Months   Status Achieved   PT SHORT TERM GOAL #5   Title Patient ambulates 22 with RW / prostheses & turns 90* to sit in chair with armrests with moderate assist (can have 2nd person for safety). Target Date: 08/30/2015   Baseline Partially MET 08/28/2015 Patient ambulates 15' with RW /prostheses & turns 90* to sit in chair with armrests with moderate assist (2nd person required for assist)   Time 1   Period Months   Status Partially Met   PT SHORT TERM GOAL #6   Title Patient verbalizes standing & HEP compliance >/= 4 days/wk. (Target Date: 10/16/2015)   Time 1   Period Months   Status On-going   PT SHORT TERM GOAL #7   Title Patient ambulates 255 with RW & prostheses with wife's assist with PT supervising.  (Target Date: 10/16/2015)   Time 1   Period Months   Status On-going   PT SHORT TERM GOAL #8   Title Patient ambulates 346 with RW & prostheses with minA.  (Target Date: 10/16/2015)   Time 1   Period Months   Status On-going           PT Long Term Goals - 09/16/15 1145    PT LONG TERM GOAL #1   Title  Patient and wife demonstrate proper prosthetic care. (Target Date: 09/20/2015)   Baseline MET 09/16/2015   Time 3  Period Months   Status Achieved   PT LONG TERM GOAL #2   Title Pt & wife verbalize & demonstrate ongoing HEP / fitness plan.  (Target Date: 09/20/2015)  NEW Target DATE 11/15/2015   Baseline Patient is independent in current HEP but PT continues to update. 09/16/2015   Time 3   Period Months   Status Partially Met   PT LONG TERM GOAL #3   Title stand pivot transfers with rolling walker & prostheses with supervision.  (Target Date: 09/20/2015) NEW Target DATE 11/15/2015   Baseline NOT MET 09/16/2015 Patient flucuatates between [place] for stand pivot transfers.    Time 3   Period Months   Status On-going   PT LONG TERM GOAL #4   Title adjusts clothes & reaches 2" anteriorly while standing with RW support with minimal assist.  (Target Date: 09/20/2015)  NEW Target DATE 11/15/2015   Baseline PARTIALLY MET 09/16/2015 Patient reaches 2" anteriorly with minA but unable to adjust his clothes.    Time 3   Period Months   Status On-going   PT LONG TERM GOAL #5   Title ambulate 62' with rolling walker & prostheses with minimal assist.  (Target Date: 09/20/2015)  NEW Target DATE 11/15/2015   Baseline Partially MET 09/16/2015 Patient ambulates 49' with RW & prostheses with minA for straight pathes & modA to turn to position to sit.    Time 3   Period Months   Status Revised               Plan - 10/02/15 1330    Clinical Impression Statement Patient was inconsistent in turning 90* to position to sit which limits gait outside of PT to straight paths with additional person to keep w/c close. Patient would benefit from "zoom ball" at home as he expressed interest and felt it helped.    Pt will benefit from skilled therapeutic intervention in order to improve on the following deficits Abnormal gait;Decreased activity tolerance;Decreased balance;Decreased endurance;Decreased  mobility;Decreased range of motion;Decreased strength;Postural dysfunction;Prosthetic Dependency   Rehab Potential Good   PT Frequency 2x / week   PT Duration Other (comment)  9 weeks (60 days)   PT Treatment/Interventions ADLs/Self Care Home Management;DME Instruction;Gait training;Stair training;Functional mobility training;Therapeutic activities;Therapeutic exercise;Balance training;Neuromuscular re-education;Patient/family education;Prosthetic Training   PT Next Visit Plan continue towards updated STGs & LTGs   Consulted and Agree with Plan of Care Patient;Family member/caregiver   Family Member Consulted wife        Problem List Patient Active Problem List   Diagnosis Date Noted  . Diarrhea 08/14/2014  . Dehydration 10/02/2013  . Fever 10/02/2013  . Altered mental status 10/01/2013  . Encephalopathy, toxic 10/01/2013  . Encephalopathy, metabolic 08/67/6195  . CVA (cerebral vascular accident) ([place]) 09/28/2013  . FTT (failure to thrive) in adult 09/28/2013  . Acute confusional state 09/28/2013  . Ulcer of sacral region, stage 3 ([place]) 09/26/2013  . S/P BKA (below knee amputation) bilateral ([place]) 09/08/2013  . ESRD on hemodialysis ([place]) 05/09/2013  . TIA (transient ischemic attack) 02/20/2013  . Acute blood loss anemia 10/28/2012  . Chronic combined systolic (EF 09%) and grade 2diastolic congestive heart failure 10/28/2012  . Obstipation 10/28/2012  . GI bleed 10/27/2012  . Mass in rectum 10/27/2012  . DM (diabetes mellitus) type I controlled with renal manifestation ([place]) 09/08/2012  . LVH (left ventricular hypertrophy)-severe concentric 06/13/2012  . Anemia due to chronic illness 06/12/2012  . Hyperlipidemia 01/30/2011  . CHOLELITHIASIS 08/01/2010  .  BENIGN PROSTATIC HYPERTROPHY 08/01/2010  . CEREBROVASCULAR ACCIDENT, HX OF 08/06/2009  . Depression 03/18/2009  . Acute combined systolic and diastolic heart failure, NYHA class 2-EF 45% 03/18/2009  . NEPHROLITHIASIS, HX OF  03/18/2009  . Morbid obesity ([place]) 03/25/2007  . Essential hypertension 03/25/2007  . GERD 03/25/2007  . Chronic hepatitis C without hepatic coma ([place]) 03/25/2007    Sudie Bandel PT, DPT 10/03/2015, 9:27 AM  [facility] [address] [place], [place], [address] Phone: [phone]   Fax:  [phone]  Name: Oscar Castillo MRN: [id number] Date of Birth: 12/26/1950

## 2015-10-04 ENCOUNTER — Ambulatory Visit: Payer: Medicare Other | Admitting: Physical Therapy

## 2015-10-04 DIAGNOSIS — Z89512 Acquired absence of left leg below knee: Secondary | ICD-10-CM | POA: Diagnosis not present

## 2015-10-04 DIAGNOSIS — R2689 Other abnormalities of gait and mobility: Secondary | ICD-10-CM

## 2015-10-04 DIAGNOSIS — M79641 Pain in right hand: Secondary | ICD-10-CM | POA: Diagnosis not present

## 2015-10-04 DIAGNOSIS — R5381 Other malaise: Secondary | ICD-10-CM

## 2015-10-04 DIAGNOSIS — R2991 Unspecified symptoms and signs involving the musculoskeletal system: Secondary | ICD-10-CM | POA: Diagnosis not present

## 2015-10-04 DIAGNOSIS — Z7409 Other reduced mobility: Secondary | ICD-10-CM

## 2015-10-04 DIAGNOSIS — R269 Unspecified abnormalities of gait and mobility: Secondary | ICD-10-CM

## 2015-10-04 DIAGNOSIS — R531 Weakness: Secondary | ICD-10-CM | POA: Diagnosis not present

## 2015-10-04 DIAGNOSIS — R29818 Other symptoms and signs involving the nervous system: Secondary | ICD-10-CM | POA: Diagnosis not present

## 2015-10-04 DIAGNOSIS — R6889 Other general symptoms and signs: Secondary | ICD-10-CM

## 2015-10-04 DIAGNOSIS — Z89511 Acquired absence of right leg below knee: Secondary | ICD-10-CM

## 2015-10-04 DIAGNOSIS — M24669 Ankylosis, unspecified knee: Secondary | ICD-10-CM

## 2015-10-04 NOTE — Therapy (Signed)
[facility] [address] [place], [place], [address] Phone: [phone]   Fax:  [phone]  Physical Therapy Treatment  Patient Details  Name: Oscar Castillo MRN: [id number] Date of Birth: 11-20-1950 Referring Provider: [name], MD  Encounter Date: 10/04/2015      PT End of Session - 10/04/15 1323    Visit Number 27   Number of Visits 40   Date for PT Re-Evaluation 11/15/15   Authorization Type Medicare Do G-Code & progress reports every 10 visits   PT Start Time 1320   PT Stop Time 1400   PT Time Calculation (min) 40 min   Equipment Utilized During Treatment Gait belt   Activity Tolerance Patient tolerated treatment well;Patient limited by pain   Behavior During Therapy [facility] for tasks assessed/performed      Past Medical History  Diagnosis Date  . ESRD on hemodialysis ([place]) 05/05/2007    ESRD due to DM/HTN. Started dialysis in November 2013.  HD TTS at [facility] [address].  [name] BACK PAIN, LUMBAR, CHRONIC 08/06/2009  . BENIGN PROSTATIC HYPERTROPHY 08/01/2010  . CEREBROVASCULAR ACCIDENT, HX OF 08/06/2009  . CHOLELITHIASIS 08/01/2010  . CONGESTIVE HEART FAILURE 03/18/2009  . DEPRESSION 03/18/2009  . DIABETES MELLITUS, TYPE II 03/25/2007  . ERECTILE DYSFUNCTION 03/25/2007  . GERD 03/25/2007  . HEPATITIS C, HX OF 03/25/2007  . HYPERTENSION 03/25/2007  . Morbid obesity ([place]) 03/25/2007  . NEPHROLITHIASIS, HX OF 03/18/2009  . Complication of anesthesia     wife states pt had trouble waking up with his last surgery in Nov., 2014  . History of Clostridium difficile   . Hemorrhoids   . Anemia   . Antral ulcer 2014    small    Past Surgical History  Procedure Laterality Date  . Nephrectomy      partial RR  . Av fistula placement  06/14/2012    Procedure: ARTERIOVENOUS (AV) FISTULA CREATION;  Surgeon: Angelia Mould, MD;  Location: [facility] OR;  Service: Vascular;  Laterality: Left;  Left basilic vein transposition  with fistula.  . Tibia im nail insertion Left 09/09/2012    Procedure: INTRAMEDULLARY (IM) NAIL TIBIAL;  Surgeon: Johnny Bridge, MD;  Location: [place];  Service: Orthopedics;  Laterality: Left;  left tibial nail and open reduction internal fixation left fibula fracture  . Orif fibula fracture Left 09/09/2012    Procedure: OPEN REDUCTION INTERNAL FIXATION (ORIF) FIBULA FRACTURE;  Surgeon: Johnny Bridge, MD;  Location: [place];  Service: Orthopedics;  Laterality: Left;  . Colonoscopy N/A 10/28/2012    Procedure: COLONOSCOPY;  Surgeon: Jeryl Columbia, MD;  Location: [facility] ENDOSCOPY;  Service: Endoscopy;  Laterality: N/A;  . Esophagogastroduodenoscopy N/A 11/02/2012    Procedure: ESOPHAGOGASTRODUODENOSCOPY (EGD);  Surgeon: Cleotis Nipper, MD;  Location: [facility] ENDOSCOPY;  Service: Endoscopy;  Laterality: N/A;  . Colonoscopy N/A 11/02/2012    Procedure: COLONOSCOPY;  Surgeon: Cleotis Nipper, MD;  Location: [facility] ENDOSCOPY;  Service: Endoscopy;  Laterality: N/A;  . Colonoscopy N/A 11/03/2012    Procedure: COLONOSCOPY;  Surgeon: Cleotis Nipper, MD;  Location: [facility] ENDOSCOPY;  Service: Endoscopy;  Laterality: N/A;  . Givens capsule study N/A 11/04/2012    Procedure: GIVENS CAPSULE STUDY;  Surgeon: Cleotis Nipper, MD;  Location: [facility] ENDOSCOPY;  Service: Endoscopy;  Laterality: N/A;  . Enteroscopy N/A 11/08/2012    Procedure: ENTEROSCOPY;  Surgeon: Wonda Horner, MD;  Location: [facility] ENDOSCOPY;  Service: Endoscopy;  Laterality: N/A;  .  Amputation Left 05/12/2013    Procedure: AMPUTATION RAY;  Surgeon: Newt Minion, MD;  Location: [place];  Service: Orthopedics;  Laterality: Left;  Left Foot 1st Ray Amputation  . Eye surgery Left     to remove scar tissue  . Amputation Left 06/09/2013    Procedure: AMPUTATION BELOW KNEE;  Surgeon: Newt Minion, MD;  Location: [place];  Service: Orthopedics;  Laterality: Left;  Left Below Knee Amputation and removal proximal screws IM tibial nail  . Hardware removal Left 06/09/2013     Procedure: HARDWARE REMOVAL;  Surgeon: Newt Minion, MD;  Location: [place];  Service: Orthopedics;  Laterality: Left;  Left Below Knee Amputation  and Removal proximal screws IM tibial nail  . Amputation Right 09/08/2013    Procedure: AMPUTATION BELOW KNEE;  Surgeon: Newt Minion, MD;  Location: [place];  Service: Orthopedics;  Laterality: Right;  Right Below Knee Amputation  . Amputation Right 10/11/2013    Procedure: AMPUTATION BELOW KNEE;  Surgeon: Newt Minion, MD;  Location: [place];  Service: Orthopedics;  Laterality: Right;  Right Below Knee Amputation Revision    There were no vitals filed for this visit.  Visit Diagnosis:  Generalized weakness  Balance problems  Impaired transfers  Impaired functional mobility and activity tolerance  Status post bilateral below knee amputation (HCC)  Abnormality of gait  Decreased functional activity tolerance  Decreased range of knee movement, unspecified laterality  Debility  Pain, hand joint, right      Subjective Assessment - 10/04/15 1322    Subjective No new complaints. No falls to report. Was tired after last session.   Patient is accompained by: Family member  spouse   Patient Stated Goals To be able to walk more & hopefully by himself with prostheses   Currently in Pain? Yes   Pain Score 3    Pain Location Finger (Comment which one)   Pain Orientation Right;Left   Pain Descriptors / Indicators Tightness;Sore   Pain Type Chronic pain   Pain Onset More than a month ago   Pain Frequency Constant   Aggravating Factors  dialysis   Pain Relieving Factors medications          [facility] Adult PT Treatment/Exercise - 10/04/15 1329    Transfers   Sit to Stand 3: Mod assist;With upper extremity assist;With armrests;From chair/3-in-1   Sit to Stand Details Verbal cues for sequencing;Verbal cues for technique;Tactile cues for weight shifting;Tactile cues for posture;Tactile cues for sequencing;Manual facilitation for weight shifting    Stand to Sit 3: Mod assist;With upper extremity assist;With armrests;To chair/3-in-1   Stand to Sit Details (indicate cue type and reason) Verbal cues for precautions/safety;Verbal cues for technique;Verbal cues for sequencing;Manual facilitation for weight shifting;Tactile cues for weight shifting;Tactile cues for posture;Tactile cues for sequencing   Stand Pivot Transfers 3: Mod assist  of 2 people with walker for chair<>wheelchair transfer   Stand Pivot Transfer Details (indicate cue type and reason) multimodal cues on sequencing with walker and bil prostheses, cues on postue and for terminal knee extension with stance phase on each leg.   Ambulation/Gait   Ambulation/Gait Yes   Ambulation/Gait Assistance 3: Mod assist;2: Max assist  2cd person for safety with gait and turning 90* to sit down   Ambulation/Gait Assistance Details cues on knee extension with stance, walker position and step length/placement with gait. cues on sequencing with 90* turns, 4th rep being the best one with min>mod assist of 2 people needed vs mod>max  assist with previous 3 reps.    Ambulation Distance (Feet) 10 Feet  x 4 reps with 90* turns each time   Assistive device Prostheses;Rolling walker   Gait Pattern Decreased step length - right;Decreased step length - left;Decreased stride length;Right flexed knee in stance;Left flexed knee in stance;Trunk flexed;Narrow base of support   Ambulation Surface Level;Indoor   Prosthetics   Current prosthetic wear tolerance (days/week)  7 days   Current prosthetic wear tolerance (#hours/day)  90% of awake hours, including dialysis   Residual limb condition  intact      treatment continued Min assist for sit<>stand in parallel bars x 2 reps Seated on stool with pillow: Curl ups x 20 reps Upper trunk rotation left<>right with 2# weighted ball x 20 reps each way          PT Short Term Goals - 10/02/15 1330    PT SHORT TERM GOAL #1   Title Patient sit to /from stand  w/c to sink with moderate assist. (NEW Target Date: 08/30/2015)    Baseline MET 08/28/2015 to RW   Status Achieved   PT SHORT TERM GOAL #2   Title Patient & wife demonstrate / verbalize HEP / updated exercise program for fitness center. (Target Date: 07/26/2015)   Baseline 07/24/15: doing HEP on non dialysis days, seated ones every day and [facility] on [place] per family.    Status Achieved   PT SHORT TERM GOAL #3   Title Patient ambulates 2' with RW & prostheses with moderate assist (2 people for safety) (Target Date: 07/26/2015)   Baseline 07/24/15: pt able to ambulate short distance with mod assist of 2 people, did not reach 40 feet goal   Status Partially Met   PT SHORT TERM GOAL #4   Title Patient tolerates 30 seconds of standing with RW with minA. (Target Date: 08/30/2015)   Baseline MET 08/28/2015    Time 1   Period Months   Status Achieved   PT SHORT TERM GOAL #5   Title Patient ambulates 84' with RW / prostheses & turns 90* to sit in chair with armrests with moderate assist (can have 2nd person for safety). Target Date: 08/30/2015   Baseline Partially MET 08/28/2015 Patient ambulates 15' with RW /prostheses & turns 90* to sit in chair with armrests with moderate assist (2nd person required for assist)   Time 1   Period Months   Status Partially Met   PT SHORT TERM GOAL #6   Title Patient verbalizes standing & HEP compliance >/= 4 days/wk. (Target Date: 10/16/2015)   Time 1   Period Months   Status On-going   PT SHORT TERM GOAL #7   Title Patient ambulates 18' with RW & prostheses with wife's assist with PT supervising.  (Target Date: 10/16/2015)   Time 1   Period Months   Status On-going   PT SHORT TERM GOAL #8   Title Patient ambulates 68' with RW & prostheses with minA.  (Target Date: 10/16/2015)   Time 1   Period Months   Status On-going           PT Long Term Goals - 09/16/15 1145    PT LONG TERM GOAL #1   Title Patient and wife demonstrate proper prosthetic care.  (Target Date: 09/20/2015)   Baseline MET 09/16/2015   Time 3   Period Months   Status Achieved   PT LONG TERM GOAL #2   Title Pt & wife verbalize & demonstrate ongoing HEP /  fitness plan.  (Target Date: 09/20/2015)  NEW Target DATE 11/15/2015   Baseline Patient is independent in current HEP but PT continues to update. 09/16/2015   Time 3   Period Months   Status Partially Met   PT LONG TERM GOAL #3   Title stand pivot transfers with rolling walker & prostheses with supervision.  (Target Date: 09/20/2015) NEW Target DATE 11/15/2015   Baseline NOT MET 09/16/2015 Patient flucuatates between [place] for stand pivot transfers.    Time 3   Period Months   Status On-going   PT LONG TERM GOAL #4   Title adjusts clothes & reaches 2" anteriorly while standing with RW support with minimal assist.  (Target Date: 09/20/2015)  NEW Target DATE 11/15/2015   Baseline PARTIALLY MET 09/16/2015 Patient reaches 2" anteriorly with minA but unable to adjust his clothes.    Time 3   Period Months   Status On-going   PT LONG TERM GOAL #5   Title ambulate 17' with rolling walker & prostheses with minimal assist.  (Target Date: 09/20/2015)  NEW Target DATE 11/15/2015   Baseline Partially MET 09/16/2015 Patient ambulates 93' with RW & prostheses with minA for straight pathes & modA to turn to position to sit.    Time 3   Period Months   Status Revised           Plan - 10/04/15 1323    Clinical Impression Statement Pt/spouse unable to find a zoom ball at the stores recommned last session. Computer search found them at both [employer] and [employer], information printed and provided to pt/spouse. Skilled session continued to focus on 1* turns with gait with improvement noted as reps progressed. Sitll not safe to do these at home. Pt is making slow, steady progress toward goals.                          Pt will benefit from skilled therapeutic intervention in order to improve on the following deficits Abnormal  gait;Decreased activity tolerance;Decreased balance;Decreased endurance;Decreased mobility;Decreased range of motion;Decreased strength;Postural dysfunction;Prosthetic Dependency   Rehab Potential Good   PT Frequency 2x / week   PT Duration Other (comment)  9 weeks (60 days)   PT Treatment/Interventions ADLs/Self Care Home Management;DME Instruction;Gait training;Stair training;Functional mobility training;Therapeutic activities;Therapeutic exercise;Balance training;Neuromuscular re-education;Patient/family education;Prosthetic Training   PT Next Visit Plan continue towards updated STGs & LTGs   Consulted and Agree with Plan of Care Patient;Family member/caregiver   Family Member Consulted wife        Problem List Patient Active Problem List   Diagnosis Date Noted  . Diarrhea 08/14/2014  . Dehydration 10/02/2013  . Fever 10/02/2013  . Altered mental status 10/01/2013  . Encephalopathy, toxic 10/01/2013  . Encephalopathy, metabolic 91/47/8295  . CVA (cerebral vascular accident) ([place]) 09/28/2013  . FTT (failure to thrive) in adult 09/28/2013  . Acute confusional state 09/28/2013  . Ulcer of sacral region, stage 3 ([place]) 09/26/2013  . S/P BKA (below knee amputation) bilateral ([place]) 09/08/2013  . ESRD on hemodialysis ([place]) 05/09/2013  . TIA (transient ischemic attack) 02/20/2013  . Acute blood loss anemia 10/28/2012  . Chronic combined systolic (EF 62%) and grade 2diastolic congestive heart failure 10/28/2012  . Obstipation 10/28/2012  . GI bleed 10/27/2012  . Mass in rectum 10/27/2012  . DM (diabetes mellitus) type I controlled with renal manifestation ([place]) 09/08/2012  . LVH (left ventricular hypertrophy)-severe concentric 06/13/2012  . Anemia due to chronic  illness 06/12/2012  . Hyperlipidemia 01/30/2011  . CHOLELITHIASIS 08/01/2010  . BENIGN PROSTATIC HYPERTROPHY 08/01/2010  . CEREBROVASCULAR ACCIDENT, HX OF 08/06/2009  . Depression 03/18/2009  . Acute combined systolic and  diastolic heart failure, NYHA class 2-EF 45% 03/18/2009  . NEPHROLITHIASIS, HX OF 03/18/2009  . Morbid obesity ([place]) 03/25/2007  . Essential hypertension 03/25/2007  . GERD 03/25/2007  . Chronic hepatitis C without hepatic coma ([place]) 03/25/2007    Willow Ora, PTA, [place] [address] [place], [place] [address] [phone] 10/04/2015, 2:32 PM   Name: Oscar Castillo MRN: [id number] Date of Birth: 10/21/1950

## 2015-10-05 DIAGNOSIS — D631 Anemia in chronic kidney disease: Secondary | ICD-10-CM | POA: Diagnosis not present

## 2015-10-05 DIAGNOSIS — N186 End stage renal disease: Secondary | ICD-10-CM | POA: Diagnosis not present

## 2015-10-05 DIAGNOSIS — N2581 Secondary hyperparathyroidism of renal origin: Secondary | ICD-10-CM | POA: Diagnosis not present

## 2015-10-05 DIAGNOSIS — E1122 Type 2 diabetes mellitus with diabetic chronic kidney disease: Secondary | ICD-10-CM | POA: Diagnosis not present

## 2015-10-07 ENCOUNTER — Ambulatory Visit (INDEPENDENT_AMBULATORY_CARE_PROVIDER_SITE_OTHER): Payer: Medicare Other | Admitting: Internal Medicine

## 2015-10-07 VITALS — BP 139/82 | HR 80 | Temp 98.1°F | Ht 70.0 in | Wt 180.0 lb

## 2015-10-07 DIAGNOSIS — B182 Chronic viral hepatitis C: Secondary | ICD-10-CM | POA: Diagnosis not present

## 2015-10-07 DIAGNOSIS — K7400 Hepatic fibrosis, unspecified: Secondary | ICD-10-CM

## 2015-10-07 LAB — CBC WITH DIFFERENTIAL/PLATELET
BASOS ABS: 0.1 10*3/uL (ref 0.0–0.1)
BASOS PCT: 1 % (ref 0–1)
EOS ABS: 0.1 10*3/uL (ref 0.0–0.7)
EOS PCT: 1 % (ref 0–5)
HCT: 36.7 % — ABNORMAL LOW (ref 39.0–52.0)
Hemoglobin: 12.2 g/dL — ABNORMAL LOW (ref 13.0–17.0)
LYMPHS ABS: 2 10*3/uL (ref 0.7–4.0)
Lymphocytes Relative: 30 % (ref 12–46)
MCH: 28.8 pg (ref 26.0–34.0)
MCHC: 33.2 g/dL (ref 30.0–36.0)
MCV: 86.6 fL (ref 78.0–100.0)
MPV: 9.7 fL (ref 8.6–12.4)
Monocytes Absolute: 0.6 10*3/uL (ref 0.1–1.0)
Monocytes Relative: 9 % (ref 3–12)
NEUTROS PCT: 59 % (ref 43–77)
Neutro Abs: 3.8 10*3/uL (ref 1.7–7.7)
PLATELETS: 283 10*3/uL (ref 150–400)
RBC: 4.24 MIL/uL (ref 4.22–5.81)
RDW: 15.4 % (ref 11.5–15.5)
WBC: 6.5 10*3/uL (ref 4.0–10.5)

## 2015-10-07 NOTE — Assessment & Plan Note (Signed)
Will recheck elastography in 1 year.  

## 2015-10-07 NOTE — Progress Notes (Signed)
   Subjective:    Patient ID: Oscar Castillo, male    DOB: May 23, 1951, 65 y.o.   MRN: [id number]  HPI Here for follow up of hepatitis C.  Has started elbasvir/grazoprevir and now on his second bottle.  No issues with headache or fatigue.  No complaints.  Genotype 1b, viral load of 335,000.     Review of Systems  Constitutional: Negative for fatigue.  Gastrointestinal: Negative for nausea.  Skin: Negative for rash.  Neurological: Negative for dizziness and headaches.       Objective:   Physical Exam  Constitutional: He appears well-developed and well-nourished. No distress.  Eyes: No scleral icterus.  Cardiovascular: Normal rate, regular rhythm and normal heart sounds.   No murmur heard. Skin: No rash noted.          Assessment & Plan:

## 2015-10-07 NOTE — Assessment & Plan Note (Signed)
Doing well so far. Early viral load today and rtc after treatment completion.  Has ESRD so will defer cmp to nephrology since it is routinely done.

## 2015-10-08 ENCOUNTER — Telehealth: Payer: Self-pay | Admitting: *Deleted

## 2015-10-08 DIAGNOSIS — N186 End stage renal disease: Secondary | ICD-10-CM | POA: Diagnosis not present

## 2015-10-08 DIAGNOSIS — B182 Chronic viral hepatitis C: Secondary | ICD-10-CM | POA: Diagnosis not present

## 2015-10-08 DIAGNOSIS — N2581 Secondary hyperparathyroidism of renal origin: Secondary | ICD-10-CM | POA: Diagnosis not present

## 2015-10-08 DIAGNOSIS — E1122 Type 2 diabetes mellitus with diabetic chronic kidney disease: Secondary | ICD-10-CM | POA: Diagnosis not present

## 2015-10-08 DIAGNOSIS — D631 Anemia in chronic kidney disease: Secondary | ICD-10-CM | POA: Diagnosis not present

## 2015-10-08 LAB — COMPLETE METABOLIC PANEL WITH GFR
ALT: 6 U/L — AB (ref 9–46)
AST: 8 U/L — AB (ref 10–35)
Albumin: 3.9 g/dL (ref 3.6–5.1)
Alkaline Phosphatase: 49 U/L (ref 40–115)
BILIRUBIN TOTAL: 0.4 mg/dL (ref 0.2–1.2)
BUN: 57 mg/dL — ABNORMAL HIGH (ref 7–25)
CHLORIDE: 96 mmol/L — AB (ref 98–110)
CO2: 25 mmol/L (ref 20–31)
CREATININE: 8.97 mg/dL — AB (ref 0.70–1.25)
Calcium: 9.8 mg/dL (ref 8.6–10.3)
GFR, EST AFRICAN AMERICAN: 6 mL/min — AB (ref >=60)
GFR, Est Non African American: 6 mL/min — ABNORMAL LOW (ref >=60)
GLUCOSE: 104 mg/dL — AB (ref 65–99)
Potassium: 4.9 mmol/L (ref 3.5–5.3)
SODIUM: 138 mmol/L (ref 135–146)
TOTAL PROTEIN: 7.7 g/dL (ref 6.1–8.1)

## 2015-10-08 MED FILL — *ZEPATIER 50-100 MG TABLET: 50-100 | 28 days supply | Qty: 28 | Fill #2

## 2015-10-08 NOTE — Telephone Encounter (Signed)
[facility] called reporting critical lab drawn Monday 3/13 - creatinine = 8.97, repeated and verified. Patient is on dialysis, is dialyzed T-TH-SA. Landis Gandy, RN

## 2015-10-09 ENCOUNTER — Ambulatory Visit: Payer: Medicare Other | Admitting: Physical Therapy

## 2015-10-09 ENCOUNTER — Encounter: Payer: Self-pay | Admitting: Physical Therapy

## 2015-10-09 DIAGNOSIS — R6889 Other general symptoms and signs: Secondary | ICD-10-CM

## 2015-10-09 DIAGNOSIS — Z89512 Acquired absence of left leg below knee: Secondary | ICD-10-CM

## 2015-10-09 DIAGNOSIS — Z7409 Other reduced mobility: Secondary | ICD-10-CM | POA: Diagnosis not present

## 2015-10-09 DIAGNOSIS — R5381 Other malaise: Secondary | ICD-10-CM

## 2015-10-09 DIAGNOSIS — Z89511 Acquired absence of right leg below knee: Secondary | ICD-10-CM

## 2015-10-09 DIAGNOSIS — R531 Weakness: Secondary | ICD-10-CM

## 2015-10-09 DIAGNOSIS — M79641 Pain in right hand: Secondary | ICD-10-CM

## 2015-10-09 DIAGNOSIS — R269 Unspecified abnormalities of gait and mobility: Secondary | ICD-10-CM

## 2015-10-09 DIAGNOSIS — R2689 Other abnormalities of gait and mobility: Secondary | ICD-10-CM

## 2015-10-09 DIAGNOSIS — R4189 Other symptoms and signs involving cognitive functions and awareness: Secondary | ICD-10-CM

## 2015-10-09 DIAGNOSIS — R29818 Other symptoms and signs involving the nervous system: Secondary | ICD-10-CM | POA: Diagnosis not present

## 2015-10-09 DIAGNOSIS — M24669 Ankylosis, unspecified knee: Secondary | ICD-10-CM

## 2015-10-09 DIAGNOSIS — R2991 Unspecified symptoms and signs involving the musculoskeletal system: Secondary | ICD-10-CM | POA: Diagnosis not present

## 2015-10-09 LAB — HEPATITIS C RNA QUANTITATIVE
HCV Quantitative Log: 1.2 {Log} — ABNORMAL HIGH (ref <1.18)
HCV Quantitative: 16 IU/mL — ABNORMAL HIGH (ref <15)

## 2015-10-10 DIAGNOSIS — E1122 Type 2 diabetes mellitus with diabetic chronic kidney disease: Secondary | ICD-10-CM | POA: Diagnosis not present

## 2015-10-10 DIAGNOSIS — N186 End stage renal disease: Secondary | ICD-10-CM | POA: Diagnosis not present

## 2015-10-10 DIAGNOSIS — D631 Anemia in chronic kidney disease: Secondary | ICD-10-CM | POA: Diagnosis not present

## 2015-10-10 DIAGNOSIS — I871 Compression of vein: Secondary | ICD-10-CM | POA: Diagnosis not present

## 2015-10-10 DIAGNOSIS — T82868D Thrombosis of vascular prosthetic devices, implants and grafts, subsequent encounter: Secondary | ICD-10-CM | POA: Diagnosis not present

## 2015-10-10 DIAGNOSIS — Z992 Dependence on renal dialysis: Secondary | ICD-10-CM | POA: Diagnosis not present

## 2015-10-10 DIAGNOSIS — N2581 Secondary hyperparathyroidism of renal origin: Secondary | ICD-10-CM | POA: Diagnosis not present

## 2015-10-10 NOTE — Therapy (Signed)
[facility] [address] [place], [place], [address] Phone: [phone]   Fax:  [phone]  Physical Therapy Treatment  Patient Details  Name: Oscar Castillo MRN: [id number] Date of Birth: 1951/01/10 Referring Provider: [name], MD  Encounter Date: 10/09/2015      PT End of Session - 10/09/15 1327    Visit Number 28   Number of Visits 40   Date for PT Re-Evaluation 11/15/15   Authorization Type Medicare Do G-Code & progress reports every 10 visits   PT Start Time 1318   PT Stop Time 1400   PT Time Calculation (min) 42 min   Equipment Utilized During Treatment Gait belt   Activity Tolerance Patient tolerated treatment well;Patient limited by pain   Behavior During Therapy [facility] for tasks assessed/performed      Past Medical History  Diagnosis Date  . ESRD on hemodialysis ([place]) 05/05/2007    ESRD due to DM/HTN. Started dialysis in November 2013.  HD TTS at [facility] on [place].  [name] BACK PAIN, LUMBAR, CHRONIC 08/06/2009  . BENIGN PROSTATIC HYPERTROPHY 08/01/2010  . CEREBROVASCULAR ACCIDENT, HX OF 08/06/2009  . CHOLELITHIASIS 08/01/2010  . CONGESTIVE HEART FAILURE 03/18/2009  . DEPRESSION 03/18/2009  . DIABETES MELLITUS, TYPE II 03/25/2007  . ERECTILE DYSFUNCTION 03/25/2007  . GERD 03/25/2007  . HEPATITIS C, HX OF 03/25/2007  . HYPERTENSION 03/25/2007  . Morbid obesity ([place]) 03/25/2007  . NEPHROLITHIASIS, HX OF 03/18/2009  . Complication of anesthesia     wife states pt had trouble waking up with his last surgery in Nov., 2014  . History of Clostridium difficile   . Hemorrhoids   . Anemia   . Antral ulcer 2014    small    Past Surgical History  Procedure Laterality Date  . Nephrectomy      partial RR  . Av fistula placement  06/14/2012    Procedure: ARTERIOVENOUS (AV) FISTULA CREATION;  Surgeon: Angelia Mould, MD;  Location: [facility] OR;  Service: Vascular;  Laterality: Left;  Left basilic vein transposition  with fistula.  . Tibia im nail insertion Left 09/09/2012    Procedure: INTRAMEDULLARY (IM) NAIL TIBIAL;  Surgeon: Johnny Bridge, MD;  Location: [facility];  Service: Orthopedics;  Laterality: Left;  left tibial nail and open reduction internal fixation left fibula fracture  . Orif fibula fracture Left 09/09/2012    Procedure: OPEN REDUCTION INTERNAL FIXATION (ORIF) FIBULA FRACTURE;  Surgeon: Johnny Bridge, MD;  Location: [facility];  Service: Orthopedics;  Laterality: Left;  . Colonoscopy N/A 10/28/2012    Procedure: COLONOSCOPY;  Surgeon: Jeryl Columbia, MD;  Location: [facility] ENDOSCOPY;  Service: Endoscopy;  Laterality: N/A;  . Esophagogastroduodenoscopy N/A 11/02/2012    Procedure: ESOPHAGOGASTRODUODENOSCOPY (EGD);  Surgeon: Cleotis Nipper, MD;  Location: [facility] ENDOSCOPY;  Service: Endoscopy;  Laterality: N/A;  . Colonoscopy N/A 11/02/2012    Procedure: COLONOSCOPY;  Surgeon: Cleotis Nipper, MD;  Location: [facility] ENDOSCOPY;  Service: Endoscopy;  Laterality: N/A;  . Colonoscopy N/A 11/03/2012    Procedure: COLONOSCOPY;  Surgeon: Cleotis Nipper, MD;  Location: [facility] ENDOSCOPY;  Service: Endoscopy;  Laterality: N/A;  . Givens capsule study N/A 11/04/2012    Procedure: GIVENS CAPSULE STUDY;  Surgeon: Cleotis Nipper, MD;  Location: [facility] ENDOSCOPY;  Service: Endoscopy;  Laterality: N/A;  . Enteroscopy N/A 11/08/2012    Procedure: ENTEROSCOPY;  Surgeon: Wonda Horner, MD;  Location: [facility] ENDOSCOPY;  Service: Endoscopy;  Laterality: N/A;  .  Amputation Left 05/12/2013    Procedure: AMPUTATION RAY;  Surgeon: Newt Minion, MD;  Location: [facility];  Service: Orthopedics;  Laterality: Left;  Left Foot 1st Ray Amputation  . Eye surgery Left     to remove scar tissue  . Amputation Left 06/09/2013    Procedure: AMPUTATION BELOW KNEE;  Surgeon: Newt Minion, MD;  Location: [facility];  Service: Orthopedics;  Laterality: Left;  Left Below Knee Amputation and removal proximal screws IM tibial nail  . Hardware removal Left 06/09/2013     Procedure: HARDWARE REMOVAL;  Surgeon: Newt Minion, MD;  Location: [facility];  Service: Orthopedics;  Laterality: Left;  Left Below Knee Amputation  and Removal proximal screws IM tibial nail  . Amputation Right 09/08/2013    Procedure: AMPUTATION BELOW KNEE;  Surgeon: Newt Minion, MD;  Location: [facility];  Service: Orthopedics;  Laterality: Right;  Right Below Knee Amputation  . Amputation Right 10/11/2013    Procedure: AMPUTATION BELOW KNEE;  Surgeon: Newt Minion, MD;  Location: [facility];  Service: Orthopedics;  Laterality: Right;  Right Below Knee Amputation Revision    There were no vitals filed for this visit.  Visit Diagnosis:  Generalized weakness  Balance problems  Impaired transfers  Impaired functional mobility and activity tolerance  Status post bilateral below knee amputation (HCC)  Abnormality of gait  Decreased functional activity tolerance  Decreased range of knee movement, unspecified laterality  Debility  Pain, hand joint, right  Impaired cognition      Subjective Assessment - 10/09/15 1325    Subjective No new complaints. No falls to report. No additional walking at home, has continued to stand at walker/sink.    Patient is accompained by: Family member  spouse   Patient Stated Goals To be able to walk more & hopefully by himself with prostheses   Currently in Pain? Yes   Pain Score 3    Pain Location Finger (Comment which one)   Pain Orientation Right;Left   Pain Descriptors / Indicators Aching   Pain Type Chronic pain   Pain Onset More than a month ago   Pain Frequency Constant   Aggravating Factors  dialysis   Pain Relieving Factors medications             [facility] Adult PT Treatment/Exercise - 10/09/15 1328    Transfers   Sit to Stand 4: Min assist  of 2 people   Sit to Stand Details Verbal cues for sequencing;Verbal cues for technique;Tactile cues for weight shifting;Tactile cues for posture;Tactile cues for sequencing;Manual facilitation for  weight shifting   Stand to Sit 4: Min assist  of 2 people   Stand to Sit Details (indicate cue type and reason) Verbal cues for precautions/safety;Verbal cues for technique;Verbal cues for sequencing;Manual facilitation for weight shifting;Tactile cues for weight shifting;Tactile cues for posture;Tactile cues for sequencing   Stand Pivot Transfers 4: Min assist  of 2 people   Stand Pivot Transfer Details (indicate cue type and reason) cues on sequencing, posture, knee extension and for bil foot advancement with turing to sit down.   Ambulation/Gait   Ambulation/Gait Yes   Ambulation/Gait Assistance 4: Min assist  of two people   Ambulation/Gait Assistance Details cues on posture, terminal knee extension with stance, equal step length and walker advancement/position with gait.   Ambulation Distance (Feet) 10 Feet  x2 with 90* turns; 48 ft straight path   Assistive device Prostheses;Rolling walker   Gait Pattern Decreased step length -  right;Decreased step length - left;Decreased stride length;Right flexed knee in stance;Left flexed knee in stance;Trunk flexed;Narrow base of support   Ambulation Surface Level;Indoor   Prosthetics   Current prosthetic wear tolerance (days/week)  7 days   Current prosthetic wear tolerance (#hours/day)  90% of awake hours, including dialysis   Residual limb condition  intact      seated in wheelchair: Passive knee extension stretching at end of session. Prolonged holds of 5-6 minutes each leg.           PT Short Term Goals - 10/02/15 1330    PT SHORT TERM GOAL #1   Title Patient sit to /from stand w/c to sink with moderate assist. (NEW Target Date: 08/30/2015)    Baseline MET 08/28/2015 to RW   Status Achieved   PT SHORT TERM GOAL #2   Title Patient & wife demonstrate / verbalize HEP / updated exercise program for fitness center. (Target Date: 07/26/2015)   Baseline 07/24/15: doing HEP on non dialysis days, seated ones every day and [facility] on  [place] per family.    Status Achieved   PT SHORT TERM GOAL #3   Title Patient ambulates 70' with RW & prostheses with moderate assist (2 people for safety) (Target Date: 07/26/2015)   Baseline 07/24/15: pt able to ambulate short distance with mod assist of 2 people, did not reach 40 feet goal   Status Partially Met   PT SHORT TERM GOAL #4   Title Patient tolerates 30 seconds of standing with RW with minA. (Target Date: 08/30/2015)   Baseline MET 08/28/2015    Time 1   Period Months   Status Achieved   PT SHORT TERM GOAL #5   Title Patient ambulates 34' with RW / prostheses & turns 90* to sit in chair with armrests with moderate assist (can have 2nd person for safety). Target Date: 08/30/2015   Baseline Partially MET 08/28/2015 Patient ambulates 15' with RW /prostheses & turns 90* to sit in chair with armrests with moderate assist (2nd person required for assist)   Time 1   Period Months   Status Partially Met   PT SHORT TERM GOAL #6   Title Patient verbalizes standing & HEP compliance >/= 4 days/wk. (Target Date: 10/16/2015)   Time 1   Period Months   Status On-going   PT SHORT TERM GOAL #7   Title Patient ambulates 80' with RW & prostheses with wife's assist with PT supervising.  (Target Date: 10/16/2015)   Time 1   Period Months   Status On-going   PT SHORT TERM GOAL #8   Title Patient ambulates 47' with RW & prostheses with minA.  (Target Date: 10/16/2015)   Time 1   Period Months   Status On-going           PT Long Term Goals - 09/16/15 1145    PT LONG TERM GOAL #1   Title Patient and wife demonstrate proper prosthetic care. (Target Date: 09/20/2015)   Baseline MET 09/16/2015   Time 3   Period Months   Status Achieved   PT LONG TERM GOAL #2   Title Pt & wife verbalize & demonstrate ongoing HEP / fitness plan.  (Target Date: 09/20/2015)  NEW Target DATE 11/15/2015   Baseline Patient is independent in current HEP but PT continues to update. 09/16/2015   Time 3   Period Months    Status Partially Met   PT LONG TERM GOAL #3   Title stand pivot transfers  with rolling walker & prostheses with supervision.  (Target Date: 09/20/2015) NEW Target DATE 11/15/2015   Baseline NOT MET 09/16/2015 Patient flucuatates between [place] for stand pivot transfers.    Time 3   Period Months   Status On-going   PT LONG TERM GOAL #4   Title adjusts clothes & reaches 2" anteriorly while standing with RW support with minimal assist.  (Target Date: 09/20/2015)  NEW Target DATE 11/15/2015   Baseline PARTIALLY MET 09/16/2015 Patient reaches 2" anteriorly with minA but unable to adjust his clothes.    Time 3   Period Months   Status On-going   PT LONG TERM GOAL #5   Title ambulate 72' with rolling walker & prostheses with minimal assist.  (Target Date: 09/20/2015)  NEW Target DATE 11/15/2015   Baseline Partially MET 09/16/2015 Patient ambulates 32' with RW & prostheses with minA for straight pathes & modA to turn to position to sit.    Time 3   Period Months   Status Revised           Plan - 10/09/15 1327    Clinical Impression Statement Today's skilled session focused on gait and transfers wtih walker/prostheses with less assistance needed with turning 90*. Pt also able to increase gait distance today on straight pathways. Pt is making steady progress toward goals.    Pt will benefit from skilled therapeutic intervention in order to improve on the following deficits Abnormal gait;Decreased activity tolerance;Decreased balance;Decreased endurance;Decreased mobility;Decreased range of motion;Decreased strength;Postural dysfunction;Prosthetic Dependency   Rehab Potential Good   PT Frequency 2x / week   PT Duration Other (comment)  9 weeks (60 days)   PT Treatment/Interventions ADLs/Self Care Home Management;DME Instruction;Gait training;Stair training;Functional mobility training;Therapeutic activities;Therapeutic exercise;Balance training;Neuromuscular re-education;Patient/family  education;Prosthetic Training   PT Next Visit Plan continue towards updated STGs & LTGs;STGs to be checked next week.   Consulted and Agree with Plan of Care Patient;Family member/caregiver   Family Member Consulted wife        Problem List Patient Active Problem List   Diagnosis Date Noted  . Liver fibrosis ([place]) 10/07/2015  . Diarrhea 08/14/2014  . Dehydration 10/02/2013  . Fever 10/02/2013  . Altered mental status 10/01/2013  . Encephalopathy, toxic 10/01/2013  . Encephalopathy, metabolic 49/17/9150  . CVA (cerebral vascular accident) ([place]) 09/28/2013  . FTT (failure to thrive) in adult 09/28/2013  . Acute confusional state 09/28/2013  . Ulcer of sacral region, stage 3 ([place]) 09/26/2013  . S/P BKA (below knee amputation) bilateral ([place]) 09/08/2013  . ESRD on hemodialysis ([place]) 05/09/2013  . TIA (transient ischemic attack) 02/20/2013  . Acute blood loss anemia 10/28/2012  . Chronic combined systolic (EF 56%) and grade 2diastolic congestive heart failure 10/28/2012  . Obstipation 10/28/2012  . GI bleed 10/27/2012  . Mass in rectum 10/27/2012  . DM (diabetes mellitus) type I controlled with renal manifestation ([place]) 09/08/2012  . LVH (left ventricular hypertrophy)-severe concentric 06/13/2012  . Anemia due to chronic illness 06/12/2012  . Hyperlipidemia 01/30/2011  . CHOLELITHIASIS 08/01/2010  . BENIGN PROSTATIC HYPERTROPHY 08/01/2010  . CEREBROVASCULAR ACCIDENT, HX OF 08/06/2009  . Depression 03/18/2009  . Acute combined systolic and diastolic heart failure, NYHA class 2-EF 45% 03/18/2009  . NEPHROLITHIASIS, HX OF 03/18/2009  . Morbid obesity ([place]) 03/25/2007  . Essential hypertension 03/25/2007  . GERD 03/25/2007  . Chronic hepatitis C without hepatic coma ([place]) 03/25/2007   Willow Ora, PTA, [facility] [address] [place], [place]  [phone] 10/10/2015, 9:26 AM   Name: Oscar Castillo MRN: [id number] Date of Birth:  May 06, 1951

## 2015-10-11 ENCOUNTER — Ambulatory Visit: Payer: Medicare Other | Admitting: Physical Therapy

## 2015-10-11 ENCOUNTER — Encounter: Payer: Self-pay | Admitting: Physical Therapy

## 2015-10-11 DIAGNOSIS — R2991 Unspecified symptoms and signs involving the musculoskeletal system: Secondary | ICD-10-CM

## 2015-10-11 DIAGNOSIS — Z89512 Acquired absence of left leg below knee: Secondary | ICD-10-CM

## 2015-10-11 DIAGNOSIS — Z89511 Acquired absence of right leg below knee: Secondary | ICD-10-CM

## 2015-10-11 DIAGNOSIS — R6889 Other general symptoms and signs: Secondary | ICD-10-CM

## 2015-10-11 DIAGNOSIS — M79641 Pain in right hand: Secondary | ICD-10-CM | POA: Diagnosis not present

## 2015-10-11 DIAGNOSIS — R5381 Other malaise: Secondary | ICD-10-CM

## 2015-10-11 DIAGNOSIS — R29818 Other symptoms and signs involving the nervous system: Secondary | ICD-10-CM | POA: Diagnosis not present

## 2015-10-11 DIAGNOSIS — M24669 Ankylosis, unspecified knee: Secondary | ICD-10-CM

## 2015-10-11 DIAGNOSIS — Z7409 Other reduced mobility: Secondary | ICD-10-CM

## 2015-10-11 DIAGNOSIS — R531 Weakness: Secondary | ICD-10-CM

## 2015-10-11 NOTE — Therapy (Signed)
[facility] [address] [place], [place], [address] Phone: [phone]   Fax:  [phone]  Physical Therapy Treatment  Patient Details  Name: Oscar Castillo MRN: [id number] Date of Birth: 04/13/1951 Referring Provider: [name], MD  Encounter Date: 10/11/2015      PT End of Session - 10/11/15 1240    Visit Number 29   Number of Visits 40   Date for PT Re-Evaluation 11/15/15   Authorization Type Medicare Do G-Code & progress reports every 10 visits   PT Start Time 5093   PT Stop Time 1315   PT Time Calculation (min) 40 min   Equipment Utilized During Treatment Gait belt   Activity Tolerance Patient tolerated treatment well;Patient limited by pain   Behavior During Therapy [facility] for tasks assessed/performed      Past Medical History  Diagnosis Date  . ESRD on hemodialysis ([place]) 05/05/2007    ESRD due to DM/HTN. Started dialysis in November 2013.  HD TTS at [facility] on [address].  [name] BACK PAIN, LUMBAR, CHRONIC 08/06/2009  . BENIGN PROSTATIC HYPERTROPHY 08/01/2010  . CEREBROVASCULAR ACCIDENT, HX OF 08/06/2009  . CHOLELITHIASIS 08/01/2010  . CONGESTIVE HEART FAILURE 03/18/2009  . DEPRESSION 03/18/2009  . DIABETES MELLITUS, TYPE II 03/25/2007  . ERECTILE DYSFUNCTION 03/25/2007  . GERD 03/25/2007  . HEPATITIS C, HX OF 03/25/2007  . HYPERTENSION 03/25/2007  . Morbid obesity ([place]) 03/25/2007  . NEPHROLITHIASIS, HX OF 03/18/2009  . Complication of anesthesia     wife states pt had trouble waking up with his last surgery in Nov., 2014  . History of Clostridium difficile   . Hemorrhoids   . Anemia   . Antral ulcer 2014    small    Past Surgical History  Procedure Laterality Date  . Nephrectomy      partial RR  . Av fistula placement  06/14/2012    Procedure: ARTERIOVENOUS (AV) FISTULA CREATION;  Surgeon: Angelia Mould, MD;  Location: [facility] OR;  Service: Vascular;  Laterality: Left;  Left basilic vein transposition  with fistula.  . Tibia im nail insertion Left 09/09/2012    Procedure: INTRAMEDULLARY (IM) NAIL TIBIAL;  Surgeon: Johnny Bridge, MD;  Location: [place];  Service: Orthopedics;  Laterality: Left;  left tibial nail and open reduction internal fixation left fibula fracture  . Orif fibula fracture Left 09/09/2012    Procedure: OPEN REDUCTION INTERNAL FIXATION (ORIF) FIBULA FRACTURE;  Surgeon: Johnny Bridge, MD;  Location: [place];  Service: Orthopedics;  Laterality: Left;  . Colonoscopy N/A 10/28/2012    Procedure: COLONOSCOPY;  Surgeon: Jeryl Columbia, MD;  Location[facility] ENDOSCOPY;  Service: Endoscopy;  Laterality: N/A;  . Esophagogastroduodenoscopy N/A 11/02/2012    Procedure: ESOPHAGOGASTRODUODENOSCOPY (EGD);  Surgeon: Cleotis Nipper, MD;  Location: [facility] ENDOSCOPY;  Service: Endoscopy;  Laterality: N/A;  . Colonoscopy N/A 11/02/2012    Procedure: COLONOSCOPY;  Surgeon: Cleotis Nipper, MD;  Location: [facility] ENDOSCOPY;  Service: Endoscopy;  Laterality: N/A;  . Colonoscopy N/A 11/03/2012    Procedure: COLONOSCOPY;  Surgeon: Cleotis Nipper, MD;  Location: [facility] ENDOSCOPY;  Service: Endoscopy;  Laterality: N/A;  . Givens capsule study N/A 11/04/2012    Procedure: GIVENS CAPSULE STUDY;  Surgeon: Cleotis Nipper, MD;  Location: [facility] ENDOSCOPY;  Service: Endoscopy;  Laterality: N/A;  . Enteroscopy N/A 11/08/2012    Procedure: ENTEROSCOPY;  Surgeon: Wonda Horner, MD;  Location: [facility] ENDOSCOPY;  Service: Endoscopy;  Laterality: N/A;  .  Amputation Left 05/12/2013    Procedure: AMPUTATION RAY;  Surgeon: Newt Minion, MD;  Location: [facility];  Service: Orthopedics;  Laterality: Left;  Left Foot 1st Ray Amputation  . Eye surgery Left     to remove scar tissue  . Amputation Left 06/09/2013    Procedure: AMPUTATION BELOW KNEE;  Surgeon: Newt Minion, MD;  Location: [place];  Service: Orthopedics;  Laterality: Left;  Left Below Knee Amputation and removal proximal screws IM tibial nail  . Hardware removal Left 06/09/2013     Procedure: HARDWARE REMOVAL;  Surgeon: Newt Minion, MD;  Location: [place];  Service: Orthopedics;  Laterality: Left;  Left Below Knee Amputation  and Removal proximal screws IM tibial nail  . Amputation Right 09/08/2013    Procedure: AMPUTATION BELOW KNEE;  Surgeon: Newt Minion, MD;  Location: [place];  Service: Orthopedics;  Laterality: Right;  Right Below Knee Amputation  . Amputation Right 10/11/2013    Procedure: AMPUTATION BELOW KNEE;  Surgeon: Newt Minion, MD;  Location: [place];  Service: Orthopedics;  Laterality: Right;  Right Below Knee Amputation Revision    There were no vitals filed for this visit.  Visit Diagnosis:  Generalized weakness  Impaired transfers  Impaired functional mobility and activity tolerance  Status post bilateral below knee amputation (HCC)  Decreased functional activity tolerance  Decreased range of knee movement, unspecified laterality  Debility      Subjective Assessment - 10/11/15 1237    Subjective Had a rough day at dialysis yesterday. His dialysis cath clogged up and he had to have minor surgery at [place] [facility] to get it unclogged. Now with restrictions to not use arm a lot.    Patient is accompained by: Family member  spouse   Patient Stated Goals To be able to walk more & hopefully by himself with prostheses   Currently in Pain? Yes   Pain Score 3    Pain Location Finger (Comment which one)   Pain Orientation Right;Left   Pain Descriptors / Indicators Aching   Pain Type Chronic pain   Pain Onset More than a month ago   Pain Frequency Constant   Aggravating Factors  dialysis   Pain Relieving Factors medications   Multiple Pain Sites Yes   Pain Score 8   Pain Location Arm  dialyisis cath location   Pain Orientation Left   Pain Type Acute pain   Pain Onset Yesterday   Pain Frequency Constant   Aggravating Factors  clogged dialysis catheter and surgery to correct it yesterday   Pain Relieving Factors not moving arm/resting        Treatment: Min guard assist for wheelchair <> mat transfer toward the right and supervision toward the left.  Seated edge of mat: Partial curl ups with inverted chair behind pt x 30 reps with cues on midline position/form. Lateral elbow taps to mat x 20 reps each side with cues to maintain upright position/not lean back. Passive bil hamstring and hip adductor stretching with prolonged holds each  With red theraband: Hamstring curls within limited ranges available x 10 reps each side. Cues to bend knees as far back as possible Long arc quads with band at middle of socket on each leg x 10 reps each leg, cues for as much knee extension as possible. Hip abduction/ER with 5 sec holds x 10 reps each.           PT Short Term Goals - 10/02/15 1330  PT SHORT TERM GOAL #1   Title Patient sit to /from stand w/c to sink with moderate assist. (NEW Target Date: 08/30/2015)    Baseline MET 08/28/2015 to RW   Status Achieved   PT SHORT TERM GOAL #2   Title Patient & wife demonstrate / verbalize HEP / updated exercise program for fitness center. (Target Date: 07/26/2015)   Baseline 07/24/15: doing HEP on non dialysis days, seated ones every day and [facility] on [address] per family.    Status Achieved   PT SHORT TERM GOAL #3   Title Patient ambulates 50' with RW & prostheses with moderate assist (2 people for safety) (Target Date: 07/26/2015)   Baseline 07/24/15: pt able to ambulate short distance with mod assist of 2 people, did not reach 40 feet goal   Status Partially Met   PT SHORT TERM GOAL #4   Title Patient tolerates 30 seconds of standing with RW with minA. (Target Date: 08/30/2015)   Baseline MET 08/28/2015    Time 1   Period Months   Status Achieved   PT SHORT TERM GOAL #5   Title Patient ambulates 30' with RW / prostheses & turns 90* to sit in chair with armrests with moderate assist (can have 2nd person for safety). Target Date: 08/30/2015   Baseline Partially MET 08/28/2015 Patient  ambulates 15' with RW /prostheses & turns 90* to sit in chair with armrests with moderate assist (2nd person required for assist)   Time 1   Period Months   Status Partially Met   PT SHORT TERM GOAL #6   Title Patient verbalizes standing & HEP compliance >/= 4 days/wk. (Target Date: 10/16/2015)   Time 1   Period Months   Status On-going   PT SHORT TERM GOAL #7   Title Patient ambulates 51' with RW & prostheses with wife's assist with PT supervising.  (Target Date: 10/16/2015)   Time 1   Period Months   Status On-going   PT SHORT TERM GOAL #8   Title Patient ambulates 79' with RW & prostheses with minA.  (Target Date: 10/16/2015)   Time 1   Period Months   Status On-going           PT Long Term Goals - 09/16/15 1145    PT LONG TERM GOAL #1   Title Patient and wife demonstrate proper prosthetic care. (Target Date: 09/20/2015)   Baseline MET 09/16/2015   Time 3   Period Months   Status Achieved   PT LONG TERM GOAL #2   Title Pt & wife verbalize & demonstrate ongoing HEP / fitness plan.  (Target Date: 09/20/2015)  NEW Target DATE 11/15/2015   Baseline Patient is independent in current HEP but PT continues to update. 09/16/2015   Time 3   Period Months   Status Partially Met   PT LONG TERM GOAL #3   Title stand pivot transfers with rolling walker & prostheses with supervision.  (Target Date: 09/20/2015) NEW Target DATE 11/15/2015   Baseline NOT MET 09/16/2015 Patient flucuatates between [place] for stand pivot transfers.    Time 3   Period Months   Status On-going   PT LONG TERM GOAL #4   Title adjusts clothes & reaches 2" anteriorly while standing with RW support with minimal assist.  (Target Date: 09/20/2015)  NEW Target DATE 11/15/2015   Baseline PARTIALLY MET 09/16/2015 Patient reaches 2" anteriorly with minA but unable to adjust his clothes.    Time 3  Period Months   Status On-going   PT LONG TERM GOAL #5   Title ambulate 52' with rolling walker & prostheses with minimal  assist.  (Target Date: 09/20/2015)  NEW Target DATE 11/15/2015   Baseline Partially MET 09/16/2015 Patient ambulates 96' with RW & prostheses with minA for straight pathes & modA to turn to position to sit.    Time 3   Period Months   Status Revised            Plan - 10/11/15 1240    Clinical Impression Statement Due to new UE restrictions today's skilled session focused on core and LE strengthening/flexibility. Pt able to complete increased reps and new exercises without issues reported. Pt continues to make steady progress toward goals.   Pt will benefit from skilled therapeutic intervention in order to improve on the following deficits Abnormal gait;Decreased activity tolerance;Decreased balance;Decreased endurance;Decreased mobility;Decreased range of motion;Decreased strength;Postural dysfunction;Prosthetic Dependency   Rehab Potential Good   PT Frequency 2x / week   PT Duration Other (comment)  9 weeks (60 days)   PT Treatment/Interventions ADLs/Self Care Home Management;DME Instruction;Gait training;Stair training;Functional mobility training;Therapeutic activities;Therapeutic exercise;Balance training;Neuromuscular re-education;Patient/family education;Prosthetic Training   PT Next Visit Plan G=CODE next visit. continue towards updated STGs & LTGs;STGs to be checked next week.   Consulted and Agree with Plan of Care Patient;Family member/caregiver   Family Member Consulted wife        Problem List Patient Active Problem List   Diagnosis Date Noted  . Liver fibrosis ([place]) 10/07/2015  . Diarrhea 08/14/2014  . Dehydration 10/02/2013  . Fever 10/02/2013  . Altered mental status 10/01/2013  . Encephalopathy, toxic 10/01/2013  . Encephalopathy, metabolic 17/91/5056  . CVA (cerebral vascular accident) ([place]) 09/28/2013  . FTT (failure to thrive) in adult 09/28/2013  . Acute confusional state 09/28/2013  . Ulcer of sacral region, stage 3 ([place]) 09/26/2013  . S/P BKA (below knee  amputation) bilateral ([place]) 09/08/2013  . ESRD on hemodialysis ([place]) 05/09/2013  . TIA (transient ischemic attack) 02/20/2013  . Acute blood loss anemia 10/28/2012  . Chronic combined systolic (EF 97%) and grade 2diastolic congestive heart failure 10/28/2012  . Obstipation 10/28/2012  . GI bleed 10/27/2012  . Mass in rectum 10/27/2012  . DM (diabetes mellitus) type I controlled with renal manifestation ([place]) 09/08/2012  . LVH (left ventricular hypertrophy)-severe concentric 06/13/2012  . Anemia due to chronic illness 06/12/2012  . Hyperlipidemia 01/30/2011  . CHOLELITHIASIS 08/01/2010  . BENIGN PROSTATIC HYPERTROPHY 08/01/2010  . CEREBROVASCULAR ACCIDENT, HX OF 08/06/2009  . Depression 03/18/2009  . Acute combined systolic and diastolic heart failure, NYHA class 2-EF 45% 03/18/2009  . NEPHROLITHIASIS, HX OF 03/18/2009  . Morbid obesity ([place]) 03/25/2007  . Essential hypertension 03/25/2007  . GERD 03/25/2007  . Chronic hepatitis C without hepatic coma ([place]) 03/25/2007   Willow Ora, PTA, [facility] [address], [place], [place] [address] [phone] 10/11/2015, 3:27 PM   Name: RITO LECOMTE MRN: [id number] Date of Birth: 1951/07/15

## 2015-10-12 DIAGNOSIS — N186 End stage renal disease: Secondary | ICD-10-CM | POA: Diagnosis not present

## 2015-10-12 DIAGNOSIS — D631 Anemia in chronic kidney disease: Secondary | ICD-10-CM | POA: Diagnosis not present

## 2015-10-12 DIAGNOSIS — N2581 Secondary hyperparathyroidism of renal origin: Secondary | ICD-10-CM | POA: Diagnosis not present

## 2015-10-12 DIAGNOSIS — E1122 Type 2 diabetes mellitus with diabetic chronic kidney disease: Secondary | ICD-10-CM | POA: Diagnosis not present

## 2015-10-14 DIAGNOSIS — N186 End stage renal disease: Secondary | ICD-10-CM | POA: Insufficient documentation

## 2015-10-14 DIAGNOSIS — Z992 Dependence on renal dialysis: Secondary | ICD-10-CM | POA: Diagnosis not present

## 2015-10-15 DIAGNOSIS — N2581 Secondary hyperparathyroidism of renal origin: Secondary | ICD-10-CM | POA: Diagnosis not present

## 2015-10-15 DIAGNOSIS — E1122 Type 2 diabetes mellitus with diabetic chronic kidney disease: Secondary | ICD-10-CM | POA: Diagnosis not present

## 2015-10-15 DIAGNOSIS — N186 End stage renal disease: Secondary | ICD-10-CM | POA: Diagnosis not present

## 2015-10-15 DIAGNOSIS — D631 Anemia in chronic kidney disease: Secondary | ICD-10-CM | POA: Diagnosis not present

## 2015-10-16 ENCOUNTER — Ambulatory Visit: Payer: Medicare Other | Admitting: Physical Therapy

## 2015-10-16 ENCOUNTER — Encounter: Payer: Self-pay | Admitting: Physical Therapy

## 2015-10-16 DIAGNOSIS — R2991 Unspecified symptoms and signs involving the musculoskeletal system: Secondary | ICD-10-CM | POA: Diagnosis not present

## 2015-10-16 DIAGNOSIS — R6889 Other general symptoms and signs: Secondary | ICD-10-CM

## 2015-10-16 DIAGNOSIS — Z7409 Other reduced mobility: Secondary | ICD-10-CM

## 2015-10-16 DIAGNOSIS — Z89512 Acquired absence of left leg below knee: Secondary | ICD-10-CM | POA: Diagnosis not present

## 2015-10-16 DIAGNOSIS — Z89511 Acquired absence of right leg below knee: Secondary | ICD-10-CM

## 2015-10-16 DIAGNOSIS — R531 Weakness: Secondary | ICD-10-CM | POA: Diagnosis not present

## 2015-10-16 DIAGNOSIS — M24669 Ankylosis, unspecified knee: Secondary | ICD-10-CM

## 2015-10-16 DIAGNOSIS — R29818 Other symptoms and signs involving the nervous system: Secondary | ICD-10-CM | POA: Diagnosis not present

## 2015-10-16 DIAGNOSIS — M79641 Pain in right hand: Secondary | ICD-10-CM | POA: Diagnosis not present

## 2015-10-16 NOTE — Therapy (Signed)
[facility] [address] [place], [place], [address] Phone: [phone]   Fax:  [phone]  Physical Therapy Treatment  Patient Details  Name: Oscar Castillo MRN: [id number] Date of Birth: [birth year] Referring Provider: [name], MD  Encounter Date: 10/16/2015      PT End of Session - 10/16/15 2208    Visit Number 30   Number of Visits 40   Date for PT Re-Evaluation 11/15/15   Authorization Type Medicare Do G-Code & progress reports every 10 visits   PT Start Time 8101   PT Stop Time 1318   PT Time Calculation (min) 43 min   Equipment Utilized During Treatment Gait belt   Activity Tolerance Patient tolerated treatment well;Patient limited by pain   Behavior During Therapy [facility] for tasks assessed/performed      Past Medical History  Diagnosis Date  . ESRD on hemodialysis ([place]) 05/05/2007    ESRD due to DM/HTN. Started dialysis in November 2013.  HD TTS at [facility] on [place].  [name] BACK PAIN, LUMBAR, CHRONIC 08/06/2009  . BENIGN PROSTATIC HYPERTROPHY 08/01/2010  . CEREBROVASCULAR ACCIDENT, HX OF 08/06/2009  . CHOLELITHIASIS 08/01/2010  . CONGESTIVE HEART FAILURE 03/18/2009  . DEPRESSION 03/18/2009  . DIABETES MELLITUS, TYPE II 03/25/2007  . ERECTILE DYSFUNCTION 03/25/2007  . GERD 03/25/2007  . HEPATITIS C, HX OF 03/25/2007  . HYPERTENSION 03/25/2007  . Morbid obesity ([place]) 03/25/2007  . NEPHROLITHIASIS, HX OF 03/18/2009  . Complication of anesthesia     wife states pt had trouble waking up with his last surgery in Nov., 2014  . History of Clostridium difficile   . Hemorrhoids   . Anemia   . Antral ulcer 2014    small    Past Surgical History  Procedure Laterality Date  . Nephrectomy      partial RR  . Av fistula placement  06/14/2012    Procedure: ARTERIOVENOUS (AV) FISTULA CREATION;  Surgeon: Angelia Mould, MD;  Location: [facility];  Service: Vascular;  Laterality: Left;  Left basilic vein transposition  with fistula.  . Tibia im nail insertion Left 09/09/2012    Procedure: INTRAMEDULLARY (IM) NAIL TIBIAL;  Surgeon: Johnny Bridge, MD;  Location: [facility];  Service: Orthopedics;  Laterality: Left;  left tibial nail and open reduction internal fixation left fibula fracture  . Orif fibula fracture Left 09/09/2012    Procedure: OPEN REDUCTION INTERNAL FIXATION (ORIF) FIBULA FRACTURE;  Surgeon: Johnny Bridge, MD;  Location: [place];  Service: Orthopedics;  Laterality: Left;  . Colonoscopy N/A 10/28/2012    Procedure: COLONOSCOPY;  Surgeon: Jeryl Columbia, MD;  Location: [facility] ENDOSCOPY;  Service: Endoscopy;  Laterality: N/A;  . Esophagogastroduodenoscopy N/A 11/02/2012    Procedure: ESOPHAGOGASTRODUODENOSCOPY (EGD);  Surgeon: Cleotis Nipper, MD;  Location: [facility] ENDOSCOPY;  Service: Endoscopy;  Laterality: N/A;  . Colonoscopy N/A 11/02/2012    Procedure: COLONOSCOPY;  Surgeon: Cleotis Nipper, MD;  Location: [facility] ENDOSCOPY;  Service: Endoscopy;  Laterality: N/A;  . Colonoscopy N/A 11/03/2012    Procedure: COLONOSCOPY;  Surgeon: Cleotis Nipper, MD;  Location: [facility] ENDOSCOPY;  Service: Endoscopy;  Laterality: N/A;  . Givens capsule study N/A 11/04/2012    Procedure: GIVENS CAPSULE STUDY;  Surgeon: Cleotis Nipper, MD;  Location: [facility] ENDOSCOPY;  Service: Endoscopy;  Laterality: N/A;  . Enteroscopy N/A 11/08/2012    Procedure: ENTEROSCOPY;  Surgeon: Wonda Horner, MD;  Location: [facility] ENDOSCOPY;  Service: Endoscopy;  Laterality: N/A;  .  Amputation Left 05/12/2013    Procedure: AMPUTATION RAY;  Surgeon: Newt Minion, MD;  Location: [place];  Service: Orthopedics;  Laterality: Left;  Left Foot 1st Ray Amputation  . Eye surgery Left     to remove scar tissue  . Amputation Left 06/09/2013    Procedure: AMPUTATION BELOW KNEE;  Surgeon: Newt Minion, MD;  Location: [place];  Service: Orthopedics;  Laterality: Left;  Left Below Knee Amputation and removal proximal screws IM tibial nail  . Hardware removal Left 06/09/2013     Procedure: HARDWARE REMOVAL;  Surgeon: Newt Minion, MD;  Location: [place];  Service: Orthopedics;  Laterality: Left;  Left Below Knee Amputation  and Removal proximal screws IM tibial nail  . Amputation Right 09/08/2013    Procedure: AMPUTATION BELOW KNEE;  Surgeon: Newt Minion, MD;  Location: [place];  Service: Orthopedics;  Laterality: Right;  Right Below Knee Amputation  . Amputation Right 10/11/2013    Procedure: AMPUTATION BELOW KNEE;  Surgeon: Newt Minion, MD;  Location: [place];  Service: Orthopedics;  Laterality: Right;  Right Below Knee Amputation Revision    There were no vitals filed for this visit.  Visit Diagnosis:  Generalized weakness  Impaired transfers  Impaired functional mobility and activity tolerance  Status post bilateral below knee amputation (HCC)  Decreased functional activity tolerance  Decreased range of knee movement, unspecified laterality      Subjective Assessment - 10/16/15 1234    Subjective He has been standing at sink. He had surgery on 10/11/15 on right UE fistula to clean it out to improve flow.    Patient is accompained by: Family member   Patient Stated Goals To be able to walk more & hopefully by himself with prostheses   Currently in Pain? Yes   Pain Score 3    Pain Location Hand   Pain Orientation Right;Left   Pain Descriptors / Indicators Aching   Pain Type Chronic pain   Pain Onset More than a month ago   Pain Frequency Constant   Aggravating Factors  dialysis    Pain Relieving Factors medications   Multiple Pain Sites No                         [facility] Adult PT Treatment/Exercise - 10/16/15 0001    Transfers   Transfers Sit to Stand;Stand to Sit;Stand Pivot Transfers   Sit to Stand 4: Min assist;With upper extremity assist;With armrests;From chair/3-in-1  to RW   Sit to Stand Details (indicate cue type and reason) cues on weight shift   Stand to Sit 4: Min assist;3: Mod assist;With upper extremity assist;With  armrests;To chair/3-in-1  from RW, modA if turning 90* to position   Stand to Sit Details verbal cues on technique, first attempt to turn 90* to sit LEs collapsed, 2nd & 3rd maintained upright until just prior to sitting.   Stand Pivot Transfers 3: Mod assist  with RW, w/c to NuStep   Stand Pivot Transfer Details (indicate cue type and reason) cues on technique   Ambulation/Gait   Ambulation/Gait Yes   Ambulation/Gait Assistance 3: Mod assist;4: Min assist  MinA on straight path, ModA turning 90* to sit, 2nd person   Ambulation/Gait Assistance Details cues on when & technique for turn to position to sit   Ambulation Distance (Feet) 20 Feet  17', 12', 12', 20'   Assistive device Prostheses;Rolling walker   Gait Pattern Decreased step length - right;Decreased  step length - left;Decreased stride length;Right flexed knee in stance;Left flexed knee in stance;Trunk flexed;Narrow base of support   Ambulation Surface Indoor;Level   Knee/Hip Exercises: Aerobic   Nustep level 4 with 4 extremities for 9 min & LEs only 1 additional minute for 10 minutes total                  PT Short Term Goals - 10/16/15 2209    PT SHORT TERM GOAL #1   Title Patient sit to /from stand w/c to sink with moderate assist. (NEW Target Date: 08/30/2015)    Baseline MET 08/28/2015 to RW   Status Achieved   PT SHORT TERM GOAL #2   Title Patient & wife demonstrate / verbalize HEP / updated exercise program for fitness center. (Target Date: 07/26/2015)   Baseline 07/24/15: doing HEP on non dialysis days, seated ones every day and [facility] on [place] per family.    Status Achieved   PT SHORT TERM GOAL #3   Title Patient ambulates 3' with RW & prostheses with moderate assist (2 people for safety) (Target Date: 07/26/2015)   Baseline 07/24/15: pt able to ambulate short distance with mod assist of 2 people, did not reach 40 feet goal   Status Partially Met   PT SHORT TERM GOAL #4   Title Patient tolerates 30  seconds of standing with RW with minA. (Target Date: 08/30/2015)   Baseline MET 08/28/2015    Time 1   Period Months   Status Achieved   PT SHORT TERM GOAL #5   Title Patient ambulates 57' with RW / prostheses & turns 90* to sit in chair with armrests with moderate assist (can have 2nd person for safety). Target Date: 08/30/2015   Baseline Partially MET 08/28/2015 Patient ambulates 15' with RW /prostheses & turns 90* to sit in chair with armrests with moderate assist (2nd person required for assist)   Time 1   Period Months   Status Partially Met   PT SHORT TERM GOAL #6   Title Patient verbalizes standing & HEP compliance >/= 4 days/wk. (Target Date: 10/16/2015)   Baseline Partially MET 10/16/2015 patient verbalizes compliance but limits stand reps due UE pain.   Time 1   Period Months   Status Achieved   PT SHORT TERM GOAL #7   Title Patient ambulates 69' with RW & prostheses with wife's assist with PT supervising.  (Target Date: 10/16/2015)   Baseline NOT MET 10/16/2015 Patient ambulates 20' with minA from PT on straight path only and modA on turns.   Time 1   Period Months   Status Not Met   PT SHORT TERM GOAL #8   Title Patient ambulates 28' with RW & prostheses with minA.  (Target Date: 10/16/2015)   Baseline NOT MET patient ambulates up to 20' before fatigues.   Time 1   Period Months   Status Not Met           PT Long Term Goals - 09/16/15 1145    PT LONG TERM GOAL #1   Title Patient and wife demonstrate proper prosthetic care. (Target Date: 09/20/2015)   Baseline MET 09/16/2015   Time 3   Period Months   Status Achieved   PT LONG TERM GOAL #2   Title Pt & wife verbalize & demonstrate ongoing HEP / fitness plan.  (Target Date: 09/20/2015)  NEW Target DATE 11/15/2015   Baseline Patient is independent in current HEP but PT continues to update. 09/16/2015  Time 3   Period Months   Status Partially Met   PT LONG TERM GOAL #3   Title stand pivot transfers with rolling walker &  prostheses with supervision.  (Target Date: 09/20/2015) NEW Target DATE 11/15/2015   Baseline NOT MET 09/16/2015 Patient flucuatates between [place] for stand pivot transfers.    Time 3   Period Months   Status On-going   PT LONG TERM GOAL #4   Title adjusts clothes & reaches 2" anteriorly while standing with RW support with minimal assist.  (Target Date: 09/20/2015)  NEW Target DATE 11/15/2015   Baseline PARTIALLY MET 09/16/2015 Patient reaches 2" anteriorly with minA but unable to adjust his clothes.    Time 3   Period Months   Status On-going   PT LONG TERM GOAL #5   Title ambulate 79' with rolling walker & prostheses with minimal assist.  (Target Date: 09/20/2015)  NEW Target DATE 11/15/2015   Baseline Partially MET 09/16/2015 Patient ambulates 55' with RW & prostheses with minA for straight pathes & modA to turn to position to sit.    Time 3   Period Months   Status Revised               Plan - 10/28/2015 10/09/2210    Clinical Impression Statement Patient met 2of 4 STGs. He is progressing towards goals but had recent surgery to fistula in left UE and pain limited use for standing since surgery.    Pt will benefit from skilled therapeutic intervention in order to improve on the following deficits Abnormal gait;Decreased activity tolerance;Decreased balance;Decreased endurance;Decreased mobility;Decreased range of motion;Decreased strength;Postural dysfunction;Prosthetic Dependency   Rehab Potential Good   PT Frequency 2x / week   PT Duration Other (comment)  9 weeks (60 days)   PT Treatment/Interventions ADLs/Self Care Home Management;DME Instruction;Gait training;Stair training;Functional mobility training;Therapeutic activities;Therapeutic exercise;Balance training;Neuromuscular re-education;Patient/family education;Prosthetic Training   PT Next Visit Plan continue towards LTGs   Consulted and Agree with Plan of Care Patient;Family member/caregiver   Family Member Consulted wife           G-Codes - 10/28/2015 October 08, 2213    Functional Assessment Tool Used MinA sit to stand w/c to RW. Patient able to stand 90sec with min guard assist using RW. Patient ambulates 50' with minA on straight path & modA of 2 people turning to position to sit   Functional Limitation Changing and maintaining body position   Changing and Maintaining Body Position Current Status [phone]) At least 60 percent but less than 80 percent impaired, limited or restricted   Changing and Maintaining Body Position Goal Status ([id number]) At least 40 percent but less than 60 percent impaired, limited or restricted      Problem List Patient Active Problem List   Diagnosis Date Noted  . Liver fibrosis ([place]) 10/07/2015  . Diarrhea 08/14/2014  . Dehydration 10/02/2013  . Fever 10/02/2013  . Altered mental status 10/01/2013  . Encephalopathy, toxic 10/01/2013  . Encephalopathy, metabolic 63/07/6008  . CVA (cerebral vascular accident) ([place]) 09/28/2013  . FTT (failure to thrive) in adult 09/28/2013  . Acute confusional state 09/28/2013  . Ulcer of sacral region, stage 3 ([place]) 08-Oct-2013  . S/P BKA (below knee amputation) bilateral ([place]) 09/08/2013  . ESRD on hemodialysis ([place]) 05/09/2013  . TIA (transient ischemic attack) 02/20/2013  . Acute blood loss anemia 10/28/2012  . Chronic combined systolic (EF 93%) and grade 2diastolic congestive heart failure 10/28/2012  . Obstipation 10/28/2012  . GI bleed 10/27/2012  .  Mass in rectum 10/27/2012  . DM (diabetes mellitus) type I controlled with renal manifestation ([place]) 09/08/2012  . LVH (left ventricular hypertrophy)-severe concentric 06/13/2012  . Anemia due to chronic illness 06/12/2012  . Hyperlipidemia 01/30/2011  . CHOLELITHIASIS 08/01/2010  . BENIGN PROSTATIC HYPERTROPHY 08/01/2010  . CEREBROVASCULAR ACCIDENT, HX OF 08/06/2009  . Depression 03/18/2009  . Acute combined systolic and diastolic heart failure, NYHA class 2-EF 45% 03/18/2009  . NEPHROLITHIASIS, HX  OF 03/18/2009  . Morbid obesity ([place]) 03/25/2007  . Essential hypertension 03/25/2007  . GERD 03/25/2007  . Chronic hepatitis C without hepatic coma ([place]) 03/25/2007    Hilding Quintanar PT, DPT 10/16/2015, 10:17 PM  [facility] [address] [place], [place], [address] Phone: [phone]   Fax:  [phone]  Name: Oscar Castillo MRN: [id number] Date of Birth: June 05, 1951  Physical Therapy Progress Note  Dates of Reporting Period: 09/06/2015 to 10/16/2015  Objective Reports of Subjective Statement: Patient reports standing at home daily.   Objective Measurements: see above  Goal Update: see above  Plan: continue plan of care established at recertification  Reason Skilled Services are Required: Patient requires skilled instruction to progress standing & gait to level that it is safe for wife to assist.

## 2015-10-17 DIAGNOSIS — N186 End stage renal disease: Secondary | ICD-10-CM | POA: Diagnosis not present

## 2015-10-17 DIAGNOSIS — D631 Anemia in chronic kidney disease: Secondary | ICD-10-CM | POA: Diagnosis not present

## 2015-10-17 DIAGNOSIS — N2581 Secondary hyperparathyroidism of renal origin: Secondary | ICD-10-CM | POA: Diagnosis not present

## 2015-10-17 DIAGNOSIS — E1122 Type 2 diabetes mellitus with diabetic chronic kidney disease: Secondary | ICD-10-CM | POA: Diagnosis not present

## 2015-10-18 ENCOUNTER — Ambulatory Visit: Payer: Medicare Other | Admitting: Physical Therapy

## 2015-10-18 DIAGNOSIS — R2991 Unspecified symptoms and signs involving the musculoskeletal system: Secondary | ICD-10-CM | POA: Diagnosis not present

## 2015-10-18 DIAGNOSIS — M79641 Pain in right hand: Secondary | ICD-10-CM

## 2015-10-18 DIAGNOSIS — M24669 Ankylosis, unspecified knee: Secondary | ICD-10-CM

## 2015-10-18 DIAGNOSIS — R531 Weakness: Secondary | ICD-10-CM

## 2015-10-18 DIAGNOSIS — R269 Unspecified abnormalities of gait and mobility: Secondary | ICD-10-CM

## 2015-10-18 DIAGNOSIS — Z7409 Other reduced mobility: Secondary | ICD-10-CM | POA: Diagnosis not present

## 2015-10-18 DIAGNOSIS — Z89512 Acquired absence of left leg below knee: Secondary | ICD-10-CM

## 2015-10-18 DIAGNOSIS — Z89511 Acquired absence of right leg below knee: Secondary | ICD-10-CM

## 2015-10-18 DIAGNOSIS — R2689 Other abnormalities of gait and mobility: Secondary | ICD-10-CM

## 2015-10-18 DIAGNOSIS — R6889 Other general symptoms and signs: Secondary | ICD-10-CM

## 2015-10-18 DIAGNOSIS — R5381 Other malaise: Secondary | ICD-10-CM

## 2015-10-18 DIAGNOSIS — R29818 Other symptoms and signs involving the nervous system: Secondary | ICD-10-CM | POA: Diagnosis not present

## 2015-10-18 NOTE — Therapy (Signed)
[facility] [address] [place], [place], [address] Phone: [phone]   Fax:  [phone]  Physical Therapy Treatment  Patient Details  Name: Oscar Castillo MRN: [id number] Date of Birth: 1950/11/26 Referring Provider: [name], MD  Encounter Date: 10/18/2015      PT End of Session - 10/18/15 1237    Visit Number 31   Number of Visits 40   Date for PT Re-Evaluation 11/15/15   Authorization Type Medicare Do G-Code & progress reports every 10 visits   PT Start Time 9628   PT Stop Time 1315   PT Time Calculation (min) 40 min   Equipment Utilized During Treatment Gait belt   Activity Tolerance Patient tolerated treatment well;Patient limited by pain   Behavior During Therapy [facility] for tasks assessed/performed      Past Medical History  Diagnosis Date  . ESRD on hemodialysis ([place]) 05/05/2007    ESRD due to DM/HTN. Started dialysis in November 2013.  HD TTS at [facility] on [address].  [name] BACK PAIN, LUMBAR, CHRONIC 08/06/2009  . BENIGN PROSTATIC HYPERTROPHY 08/01/2010  . CEREBROVASCULAR ACCIDENT, HX OF 08/06/2009  . CHOLELITHIASIS 08/01/2010  . CONGESTIVE HEART FAILURE 03/18/2009  . DEPRESSION 03/18/2009  . DIABETES MELLITUS, TYPE II 03/25/2007  . ERECTILE DYSFUNCTION 03/25/2007  . GERD 03/25/2007  . HEPATITIS C, HX OF 03/25/2007  . HYPERTENSION 03/25/2007  . Morbid obesity ([place]) 03/25/2007  . NEPHROLITHIASIS, HX OF 03/18/2009  . Complication of anesthesia     wife states pt had trouble waking up with his last surgery in Nov., 2014  . History of Clostridium difficile   . Hemorrhoids   . Anemia   . Antral ulcer 2014    small    Past Surgical History  Procedure Laterality Date  . Nephrectomy      partial RR  . Av fistula placement  06/14/2012    Procedure: ARTERIOVENOUS (AV) FISTULA CREATION;  Surgeon: Angelia Mould, MD;  Location: [facility] OR;  Service: Vascular;  Laterality: Left;  Left basilic vein transposition  with fistula.  . Tibia im nail insertion Left 09/09/2012    Procedure: INTRAMEDULLARY (IM) NAIL TIBIAL;  Surgeon: Johnny Bridge, MD;  Location: [facility];  Service: Orthopedics;  Laterality: Left;  left tibial nail and open reduction internal fixation left fibula fracture  . Orif fibula fracture Left 09/09/2012    Procedure: OPEN REDUCTION INTERNAL FIXATION (ORIF) FIBULA FRACTURE;  Surgeon: Johnny Bridge, MD;  Location: [place];  Service: Orthopedics;  Laterality: Left;  . Colonoscopy N/A 10/28/2012    Procedure: COLONOSCOPY;  Surgeon: Jeryl Columbia, MD;  Location: [facility] ENDOSCOPY;  Service: Endoscopy;  Laterality: N/A;  . Esophagogastroduodenoscopy N/A 11/02/2012    Procedure: ESOPHAGOGASTRODUODENOSCOPY (EGD);  Surgeon: Cleotis Nipper, MD;  Location: [facility] ENDOSCOPY;  Service: Endoscopy;  Laterality: N/A;  . Colonoscopy N/A 11/02/2012    Procedure: COLONOSCOPY;  Surgeon: Cleotis Nipper, MD;  Location: [facility] ENDOSCOPY;  Service: Endoscopy;  Laterality: N/A;  . Colonoscopy N/A 11/03/2012    Procedure: COLONOSCOPY;  Surgeon: Cleotis Nipper, MD;  Location: [facility] ENDOSCOPY;  Service: Endoscopy;  Laterality: N/A;  . Givens capsule study N/A 11/04/2012    Procedure: GIVENS CAPSULE STUDY;  Surgeon: Cleotis Nipper, MD;  Location: [facility] ENDOSCOPY;  Service: Endoscopy;  Laterality: N/A;  . Enteroscopy N/A 11/08/2012    Procedure: ENTEROSCOPY;  Surgeon: Wonda Horner, MD;  Location: [facility] ENDOSCOPY;  Service: Endoscopy;  Laterality: N/A;  .  Amputation Left 05/12/2013    Procedure: AMPUTATION RAY;  Surgeon: Newt Minion, MD;  Location: [place];  Service: Orthopedics;  Laterality: Left;  Left Foot 1st Ray Amputation  . Eye surgery Left     to remove scar tissue  . Amputation Left 06/09/2013    Procedure: AMPUTATION BELOW KNEE;  Surgeon: Newt Minion, MD;  Location: [facility];  Service: Orthopedics;  Laterality: Left;  Left Below Knee Amputation and removal proximal screws IM tibial nail  . Hardware removal Left 06/09/2013     Procedure: HARDWARE REMOVAL;  Surgeon: Newt Minion, MD;  Location: [place];  Service: Orthopedics;  Laterality: Left;  Left Below Knee Amputation  and Removal proximal screws IM tibial nail  . Amputation Right 09/08/2013    Procedure: AMPUTATION BELOW KNEE;  Surgeon: Newt Minion, MD;  Location: [facility] [place];  Service: Orthopedics;  Laterality: Right;  Right Below Knee Amputation  . Amputation Right 10/11/2013    Procedure: AMPUTATION BELOW KNEE;  Surgeon: Newt Minion, MD;  Location: [place];  Service: Orthopedics;  Laterality: Right;  Right Below Knee Amputation Revision    There were no vitals filed for this visit.  Visit Diagnosis:  Generalized weakness  Impaired transfers  Impaired functional mobility and activity tolerance  Status post bilateral below knee amputation (HCC)  Decreased functional activity tolerance  Decreased range of knee movement, unspecified laterality  Debility  Balance problems  Abnormality of gait  Pain, hand joint, right      Subjective Assessment - 10/18/15 1235    Subjective No new complaints. Fistula is doing better in his right arm.    Patient is accompained by: Family member  spouse   Patient Stated Goals To be able to walk more & hopefully by himself with prostheses   Currently in Pain? Yes   Pain Score 3    Pain Location Finger (Comment which one)   Pain Orientation Right;Left   Pain Descriptors / Indicators Cramping   Pain Type Chronic pain   Pain Onset More than a month ago   Pain Frequency Constant   Aggravating Factors  dialysis   Pain Relieving Factors medications, rest            [facility] Adult PT Treatment/Exercise - 10/18/15 1237    Transfers   Transfers Sit to Stand;Stand to Sit;Stand Pivot Transfers   Sit to Stand 4: Min assist;With upper extremity assist;With armrests;From chair/3-in-1   Sit to Stand Details Verbal cues for sequencing;Verbal cues for technique;Verbal cues for precautions/safety;Manual facilitation for weight  shifting   Sit to Stand Details (indicate cue type and reason) cues for foot position, anterior weight shifting and sequencing with standing up   Stand to Sit 3: Mod assist;With upper extremity assist;With armrests;To chair/3-in-1   Stand to Sit Details (indicate cue type and reason) Verbal cues for precautions/safety;Verbal cues for technique;Verbal cues for sequencing;Manual facilitation for weight shifting;Tactile cues for weight shifting;Tactile cues for posture;Tactile cues for sequencing   Stand to Sit Details most assistance needed for controlled descent to chair. Pt only able to leg go of walker to reach back to armrest 1 of 4 reps                             Stand Pivot Transfers 3: Mod assist  with second person   Stand Pivot Transfers: Patient Percentage 70%   Stand Pivot Transfer Details (indicate cue type and reason) cues on  sequencing and walker advancement/position with transfer   Ambulation/Gait   Ambulation/Gait Yes   Ambulation/Gait Assistance 4: Min assist;4: Min guard  plus 2cd person min guard to min assist ffor safety, increased assistance as gait progressed   Ambulation/Gait Assistance Details cues on posture, to decreased step length for safety at times, for terminal knee extension with stance phase and for walker position with gait. Increased assistance for balance as gait progressed.                             Ambulation Distance (Feet) 45 Feet  x1, 48 x1   Assistive device Prostheses;Rolling walker   Gait Pattern Decreased step length - right;Decreased step length - left;Decreased stride length;Right flexed knee in stance;Left flexed knee in stance;Trunk flexed;Narrow base of support   Ambulation Surface Level;Indoor   Prosthetics   Current prosthetic wear tolerance (days/week)  7 days   Current prosthetic wear tolerance (#hours/day)  90% of awake hours, including dialysis     Exercises: Partial curl ups (with chair inverted behind pt.) x 30 reps with cues on  posture/ex form. Forward flexion with reachong to floor and back up into full upright posture x 15 reps Hip abduction/ER with red band resistance x 25 reps Lateral trunk flexion with alternating elbow taps to mat x 20 each way with cues to maintain full trunk extension/upright posture.        PT Short Term Goals - 10/16/15 2209    PT SHORT TERM GOAL #1   Title Patient sit to /from stand w/c to sink with moderate assist. (NEW Target Date: 08/30/2015)    Baseline MET 08/28/2015 to RW   Status Achieved   PT SHORT TERM GOAL #2   Title Patient & wife demonstrate / verbalize HEP / updated exercise program for fitness center. (Target Date: 07/26/2015)   Baseline 07/24/15: doing HEP on non dialysis days, seated ones every day and [facility] [address] per family.    Status Achieved   PT SHORT TERM GOAL #3   Title Patient ambulates 54' with RW & prostheses with moderate assist (2 people for safety) (Target Date: 07/26/2015)   Baseline 07/24/15: pt able to ambulate short distance with mod assist of 2 people, did not reach 40 feet goal   Status Partially Met   PT SHORT TERM GOAL #4   Title Patient tolerates 30 seconds of standing with RW with minA. (Target Date: 08/30/2015)   Baseline MET 08/28/2015    Time 1   Period Months   Status Achieved   PT SHORT TERM GOAL #5   Title Patient ambulates 61' with RW / prostheses & turns 90* to sit in chair with armrests with moderate assist (can have 2nd person for safety). Target Date: 08/30/2015   Baseline Partially MET 08/28/2015 Patient ambulates 15' with RW /prostheses & turns 90* to sit in chair with armrests with moderate assist (2nd person required for assist)   Time 1   Period Months   Status Partially Met   PT SHORT TERM GOAL #6   Title Patient verbalizes standing & HEP compliance >/= 4 days/wk. (Target Date: 10/16/2015)   Baseline Partially MET 10/16/2015 patient verbalizes compliance but limits stand reps due UE pain.   Time 1   Period Months    Status Achieved   PT SHORT TERM GOAL #7   Title Patient ambulates 57' with RW & prostheses with wife's assist with PT supervising.  (Target  Date: 10/16/2015)   Baseline NOT MET 10/16/2015 Patient ambulates 20' with minA from PT on straight path only and modA on turns.   Time 1   Period Months   Status Not Met   PT SHORT TERM GOAL #8   Title Patient ambulates 67' with RW & prostheses with minA.  (Target Date: 10/16/2015)   Baseline NOT MET patient ambulates up to 20' before fatigues.   Time 1   Period Months   Status Not Met           PT Long Term Goals - 09/16/15 1145    PT LONG TERM GOAL #1   Title Patient and wife demonstrate proper prosthetic care. (Target Date: 09/20/2015)   Baseline MET 09/16/2015   Time 3   Period Months   Status Achieved   PT LONG TERM GOAL #2   Title Pt & wife verbalize & demonstrate ongoing HEP / fitness plan.  (Target Date: 09/20/2015)  NEW Target DATE 11/15/2015   Baseline Patient is independent in current HEP but PT continues to update. 09/16/2015   Time 3   Period Months   Status Partially Met   PT LONG TERM GOAL #3   Title stand pivot transfers with rolling walker & prostheses with supervision.  (Target Date: 09/20/2015) NEW Target DATE 11/15/2015   Baseline NOT MET 09/16/2015 Patient flucuatates between [place] for stand pivot transfers.    Time 3   Period Months   Status On-going   PT LONG TERM GOAL #4   Title adjusts clothes & reaches 2" anteriorly while standing with RW support with minimal assist.  (Target Date: 09/20/2015)  NEW Target DATE 11/15/2015   Baseline PARTIALLY MET 09/16/2015 Patient reaches 2" anteriorly with minA but unable to adjust his clothes.    Time 3   Period Months   Status On-going   PT LONG TERM GOAL #5   Title ambulate 80' with rolling walker & prostheses with minimal assist.  (Target Date: 09/20/2015)  NEW Target DATE 11/15/2015   Baseline Partially MET 09/16/2015 Patient ambulates 76' with RW & prostheses with minA for  straight pathes & modA to turn to position to sit.    Time 3   Period Months   Status Revised           Plan - 10/18/15 1237    Clinical Impression Statement Pt continues to make steady progress toward LTGs with increased gait distance today before needing a rest break. Continues to need increased assistance with 90* turns, still unsafe to perform these at home with spouse/family assistance.   Pt will benefit from skilled therapeutic intervention in order to improve on the following deficits Abnormal gait;Decreased activity tolerance;Decreased balance;Decreased endurance;Decreased mobility;Decreased range of motion;Decreased strength;Postural dysfunction;Prosthetic Dependency   Rehab Potential Good   PT Frequency 2x / week   PT Duration Other (comment)  9 weeks (60 days)   PT Treatment/Interventions ADLs/Self Care Home Management;DME Instruction;Gait training;Stair training;Functional mobility training;Therapeutic activities;Therapeutic exercise;Balance training;Neuromuscular re-education;Patient/family education;Prosthetic Training   PT Next Visit Plan continue towards LTGs   Consulted and Agree with Plan of Care Patient;Family member/caregiver   Family Member Consulted wife        Problem List Patient Active Problem List   Diagnosis Date Noted  . Liver fibrosis ([place]) 10/07/2015  . Diarrhea 08/14/2014  . Dehydration 10/02/2013  . Fever 10/02/2013  . Altered mental status 10/01/2013  . Encephalopathy, toxic 10/01/2013  . Encephalopathy, metabolic 30/01/6225  . CVA (cerebral vascular accident) ([place])  09/28/2013  . FTT (failure to thrive) in adult 09/28/2013  . Acute confusional state 09/28/2013  . Ulcer of sacral region, stage 3 ([place]) 09/26/2013  . S/P BKA (below knee amputation) bilateral ([place]) 09/08/2013  . ESRD on hemodialysis ([place]) 05/09/2013  . TIA (transient ischemic attack) 02/20/2013  . Acute blood loss anemia 10/28/2012  . Chronic combined systolic (EF 51%) and  grade 2diastolic congestive heart failure 10/28/2012  . Obstipation 10/28/2012  . GI bleed 10/27/2012  . Mass in rectum 10/27/2012  . DM (diabetes mellitus) type I controlled with renal manifestation ([place]) 09/08/2012  . LVH (left ventricular hypertrophy)-severe concentric 06/13/2012  . Anemia due to chronic illness 06/12/2012  . Hyperlipidemia 01/30/2011  . CHOLELITHIASIS 08/01/2010  . BENIGN PROSTATIC HYPERTROPHY 08/01/2010  . CEREBROVASCULAR ACCIDENT, HX OF 08/06/2009  . Depression 03/18/2009  . Acute combined systolic and diastolic heart failure, NYHA class 2-EF 45% 03/18/2009  . NEPHROLITHIASIS, HX OF 03/18/2009  . Morbid obesity ([place]) 03/25/2007  . Essential hypertension 03/25/2007  . GERD 03/25/2007  . Chronic hepatitis C without hepatic coma ([place]) 03/25/2007    Willow Ora, PTA, [facility] [address] [place], [place] [address] [phone] 10/18/2015, 1:37 PM   Name: Oscar Castillo MRN: [id number] Date of Birth: 1951-02-07

## 2015-10-21 ENCOUNTER — Ambulatory Visit: Payer: Medicare Other | Admitting: Physical Therapy

## 2015-10-21 ENCOUNTER — Encounter: Payer: Self-pay | Admitting: Physical Therapy

## 2015-10-21 DIAGNOSIS — R2689 Other abnormalities of gait and mobility: Secondary | ICD-10-CM

## 2015-10-21 DIAGNOSIS — Z7409 Other reduced mobility: Secondary | ICD-10-CM | POA: Diagnosis not present

## 2015-10-21 DIAGNOSIS — Z89512 Acquired absence of left leg below knee: Secondary | ICD-10-CM

## 2015-10-21 DIAGNOSIS — R2991 Unspecified symptoms and signs involving the musculoskeletal system: Secondary | ICD-10-CM | POA: Diagnosis not present

## 2015-10-21 DIAGNOSIS — R6889 Other general symptoms and signs: Secondary | ICD-10-CM

## 2015-10-21 DIAGNOSIS — R531 Weakness: Secondary | ICD-10-CM

## 2015-10-21 DIAGNOSIS — M79641 Pain in right hand: Secondary | ICD-10-CM | POA: Diagnosis not present

## 2015-10-21 DIAGNOSIS — Z89511 Acquired absence of right leg below knee: Secondary | ICD-10-CM

## 2015-10-21 DIAGNOSIS — R29818 Other symptoms and signs involving the nervous system: Secondary | ICD-10-CM | POA: Diagnosis not present

## 2015-10-21 DIAGNOSIS — R5381 Other malaise: Secondary | ICD-10-CM

## 2015-10-21 NOTE — Therapy (Signed)
[facility] [address] [place], [place], [address] Phone: [phone]   Fax:  [phone]  Physical Therapy Treatment  Patient Details  Name: Oscar Castillo MRN: [id number] Date of Birth: [birth year] Referring Provider: [name], MD  Encounter Date: 10/21/2015      PT End of Session - 10/21/15 1610    Visit Number 32   Number of Visits 40   Date for PT Re-Evaluation 11/15/15   Authorization Type Medicare Do G-Code & progress reports every 10 visits   PT Start Time 1236   PT Stop Time 1315   PT Time Calculation (min) 39 min   Equipment Utilized During Treatment Gait belt   Activity Tolerance Patient tolerated treatment well;Patient limited by pain   Behavior During Therapy [facility] for tasks assessed/performed      Past Medical History  Diagnosis Date  . ESRD on hemodialysis ([place]) 05/05/2007    ESRD due to DM/HTN. Started dialysis in November 2013.  HD TTS at [facility] on [address].  [name] BACK PAIN, LUMBAR, CHRONIC 08/06/2009  . BENIGN PROSTATIC HYPERTROPHY 08/01/2010  . CEREBROVASCULAR ACCIDENT, HX OF 08/06/2009  . CHOLELITHIASIS 08/01/2010  . CONGESTIVE HEART FAILURE 03/18/2009  . DEPRESSION 03/18/2009  . DIABETES MELLITUS, TYPE II 03/25/2007  . ERECTILE DYSFUNCTION 03/25/2007  . GERD 03/25/2007  . HEPATITIS C, HX OF 03/25/2007  . HYPERTENSION 03/25/2007  . Morbid obesity ([place]) 03/25/2007  . NEPHROLITHIASIS, HX OF 03/18/2009  . Complication of anesthesia     wife states pt had trouble waking up with his last surgery in Nov., 2014  . History of Clostridium difficile   . Hemorrhoids   . Anemia   . Antral ulcer 2014    small    Past Surgical History  Procedure Laterality Date  . Nephrectomy      partial RR  . Av fistula placement  06/14/2012    Procedure: ARTERIOVENOUS (AV) FISTULA CREATION;  Surgeon: Angelia Mould, MD;  Location: [facility] OR;  Service: Vascular;  Laterality: Left;  Left basilic vein transposition  with fistula.  . Tibia im nail insertion Left 09/09/2012    Procedure: INTRAMEDULLARY (IM) NAIL TIBIAL;  Surgeon: Johnny Bridge, MD;  Location: [facility];  Service: Orthopedics;  Laterality: Left;  left tibial nail and open reduction internal fixation left fibula fracture  . Orif fibula fracture Left 09/09/2012    Procedure: OPEN REDUCTION INTERNAL FIXATION (ORIF) FIBULA FRACTURE;  Surgeon: Johnny Bridge, MD;  Location: [place];  Service: Orthopedics;  Laterality: Left;  . Colonoscopy N/A 10/28/2012    Procedure: COLONOSCOPY;  Surgeon: Jeryl Columbia, MD;  Location: [facility] ENDOSCOPY;  Service: Endoscopy;  Laterality: N/A;  . Esophagogastroduodenoscopy N/A 11/02/2012    Procedure: ESOPHAGOGASTRODUODENOSCOPY (EGD);  Surgeon: Cleotis Nipper, MD;  Location: [facility] ENDOSCOPY;  Service: Endoscopy;  Laterality: N/A;  . Colonoscopy N/A 11/02/2012    Procedure: COLONOSCOPY;  Surgeon: Cleotis Nipper, MD;  Location: [facility] ENDOSCOPY;  Service: Endoscopy;  Laterality: N/A;  . Colonoscopy N/A 11/03/2012    Procedure: COLONOSCOPY;  Surgeon: Cleotis Nipper, MD;  Location: [facility] ENDOSCOPY;  Service: Endoscopy;  Laterality: N/A;  . Givens capsule study N/A 11/04/2012    Procedure: GIVENS CAPSULE STUDY;  Surgeon: Cleotis Nipper, MD;  Location: [facility] ENDOSCOPY;  Service: Endoscopy;  Laterality: N/A;  . Enteroscopy N/A 11/08/2012    Procedure: ENTEROSCOPY;  Surgeon: Wonda Horner, MD;  Location: [facility] ENDOSCOPY;  Service: Endoscopy;  Laterality: N/A;  .  Amputation Left 05/12/2013    Procedure: AMPUTATION RAY;  Surgeon: Newt Minion, MD;  Location: [facility];  Service: Orthopedics;  Laterality: Left;  Left Foot 1st Ray Amputation  . Eye surgery Left     to remove scar tissue  . Amputation Left 06/09/2013    Procedure: AMPUTATION BELOW KNEE;  Surgeon: Newt Minion, MD;  Location: [facility];  Service: Orthopedics;  Laterality: Left;  Left Below Knee Amputation and removal proximal screws IM tibial nail  . Hardware removal Left 06/09/2013     Procedure: HARDWARE REMOVAL;  Surgeon: Newt Minion, MD;  Location: [facility];  Service: Orthopedics;  Laterality: Left;  Left Below Knee Amputation  and Removal proximal screws IM tibial nail  . Amputation Right 09/08/2013    Procedure: AMPUTATION BELOW KNEE;  Surgeon: Newt Minion, MD;  Location: [place];  Service: Orthopedics;  Laterality: Right;  Right Below Knee Amputation  . Amputation Right 10/11/2013    Procedure: AMPUTATION BELOW KNEE;  Surgeon: Newt Minion, MD;  Location: [facility];  Service: Orthopedics;  Laterality: Right;  Right Below Knee Amputation Revision    There were no vitals filed for this visit.  Visit Diagnosis:  Generalized weakness  Impaired functional mobility and activity tolerance  Status post bilateral below knee amputation (HCC)  Impaired transfers  Balance problems  Debility  Decreased functional activity tolerance      Subjective Assessment - 10/21/15 1247    Subjective No new complaints. Fistula is doing better in his left arm.    Patient is accompained by: Family member  spouse   Patient Stated Goals To be able to walk more & hopefully by himself with prostheses   Currently in Pain? Yes   Pain Score 4    Pain Location Finger (Comment which one)   Pain Orientation Right;Left   Pain Descriptors / Indicators Sore   Pain Type Chronic pain   Pain Onset More than a month ago   Pain Frequency Constant   Pain Score 4   Pain Location Arm   Pain Orientation Left   Pain Type Acute pain   Pain Onset In the past 7 days                         [facility] Adult PT Treatment/Exercise - 10/21/15 0001    Transfers   Transfers Sit to Stand;Stand to Sit   Sit to Stand 3: Mod assist   Sit to Stand Details Tactile cues for weight shifting;Tactile cues for placement   Stand to Sit With upper extremity assist;With armrests;To chair/3-in-1;4: Min assist   Stand to Sit Details (indicate cue type and reason) Verbal cues for precautions/safety;Verbal cues  for technique;Verbal cues for sequencing;Manual facilitation for weight shifting;Tactile cues for weight shifting;Tactile cues for posture;Tactile cues for sequencing   Ambulation/Gait   Ambulation/Gait Yes   Ambulation/Gait Assistance 3: Mod assist  +2   Ambulation/Gait Assistance Details working on endurance with distance. Noted L prosthesis rotate medially with toe off  Notified Primary PT and PT observed   Ambulation Distance (Feet) 38 Feet  + 35 +20   Assistive device Prostheses;Rolling walker   Gait Pattern Decreased step length - right;Decreased step length - left;Decreased stride length;Right flexed knee in stance;Left flexed knee in stance;Trunk flexed;Narrow base of support   Ambulation Surface Level   Gait Comments Primary PT assessed that the Left Pilon may need to be outset on the prosthesis.  PT called Prosthetist  to appt to adjust L Prosthesis.             Balance Exercises - 10/21/15 1604    Balance Exercises: Seated   Dynamic Sitting Eyes closed;No upper extremity support  multilevel reaching with weighted ball for balance and core            PT Education - 10/21/15 1605    Education provided Yes   Education Details Reason for the need to have an appt. with prosthetist to adjust L prosthesis.   Person(s) Educated Patient;Spouse   Methods Explanation   Comprehension Verbalized understanding          PT Short Term Goals - 10/16/15 2209    PT SHORT TERM GOAL #1   Title Patient sit to /from stand w/c to sink with moderate assist. (NEW Target Date: 08/30/2015)    Baseline MET 08/28/2015 to RW   Status Achieved   PT SHORT TERM GOAL #2   Title Patient & wife demonstrate / verbalize HEP / updated exercise program for fitness center. (Target Date: 07/26/2015)   Baseline 07/24/15: doing HEP on non dialysis days, seated ones every day and [facility] on [place] per family.    Status Achieved   PT SHORT TERM GOAL #3   Title Patient ambulates 43' with RW &  prostheses with moderate assist (2 people for safety) (Target Date: 07/26/2015)   Baseline 07/24/15: pt able to ambulate short distance with mod assist of 2 people, did not reach 40 feet goal   Status Partially Met   PT SHORT TERM GOAL #4   Title Patient tolerates 30 seconds of standing with RW with minA. (Target Date: 08/30/2015)   Baseline MET 08/28/2015    Time 1   Period Months   Status Achieved   PT SHORT TERM GOAL #5   Title Patient ambulates 3' with RW / prostheses & turns 90* to sit in chair with armrests with moderate assist (can have 2nd person for safety). Target Date: 08/30/2015   Baseline Partially MET 08/28/2015 Patient ambulates 15' with RW /prostheses & turns 90* to sit in chair with armrests with moderate assist (2nd person required for assist)   Time 1   Period Months   Status Partially Met   PT SHORT TERM GOAL #6   Title Patient verbalizes standing & HEP compliance >/= 4 days/wk. (Target Date: 10/16/2015)   Baseline Partially MET 10/16/2015 patient verbalizes compliance but limits stand reps due UE pain.   Time 1   Period Months   Status Achieved   PT SHORT TERM GOAL #7   Title Patient ambulates 30' with RW & prostheses with wife's assist with PT supervising.  (Target Date: 10/16/2015)   Baseline NOT MET 10/16/2015 Patient ambulates 20' with minA from PT on straight path only and modA on turns.   Time 1   Period Months   Status Not Met   PT SHORT TERM GOAL #8   Title Patient ambulates 16' with RW & prostheses with minA.  (Target Date: 10/16/2015)   Baseline NOT MET patient ambulates up to 20' before fatigues.   Time 1   Period Months   Status Not Met           PT Long Term Goals - 09/16/15 1145    PT LONG TERM GOAL #1   Title Patient and wife demonstrate proper prosthetic care. (Target Date: 09/20/2015)   Baseline MET 09/16/2015   Time 3   Period Months   Status Achieved  PT LONG TERM GOAL #2   Title Pt & wife verbalize & demonstrate ongoing HEP / fitness plan.   (Target Date: 09/20/2015)  NEW Target DATE 11/15/2015   Baseline Patient is independent in current HEP but PT continues to update. 09/16/2015   Time 3   Period Months   Status Partially Met   PT LONG TERM GOAL #3   Title stand pivot transfers with rolling walker & prostheses with supervision.  (Target Date: 09/20/2015) NEW Target DATE 11/15/2015   Baseline NOT MET 09/16/2015 Patient flucuatates between [place] for stand pivot transfers.    Time 3   Period Months   Status On-going   PT LONG TERM GOAL #4   Title adjusts clothes & reaches 2" anteriorly while standing with RW support with minimal assist.  (Target Date: 09/20/2015)  NEW Target DATE 11/15/2015   Baseline PARTIALLY MET 09/16/2015 Patient reaches 2" anteriorly with minA but unable to adjust his clothes.    Time 3   Period Months   Status On-going   PT LONG TERM GOAL #5   Title ambulate 82' with rolling walker & prostheses with minimal assist.  (Target Date: 09/20/2015)  NEW Target DATE 11/15/2015   Baseline Partially MET 09/16/2015 Patient ambulates 53' with RW & prostheses with minA for straight pathes & modA to turn to position to sit.    Time 3   Period Months   Status Revised               Plan - 10/21/15 1610    Clinical Impression Statement Per Primary PT, pt's L prosthesis needs to adjusted by prosthetist to out set the foot/Pilon.  Pt continues to have limited activity tolerance with transfers and gait and tends to be unsafe with sitting down when fatigued during gait, sitting down quickly and often not reaching back for chair.              Pt will benefit from skilled therapeutic intervention in order to improve on the following deficits Abnormal gait;Decreased activity tolerance;Decreased balance;Decreased endurance;Decreased mobility;Decreased range of motion;Decreased strength;Postural dysfunction;Prosthetic Dependency   Rehab Potential Good   PT Frequency 2x / week   PT Duration Other (comment)  9 weeks (60 days)    PT Treatment/Interventions ADLs/Self Care Home Management;DME Instruction;Gait training;Stair training;Functional mobility training;Therapeutic activities;Therapeutic exercise;Balance training;Neuromuscular re-education;Patient/family education;Prosthetic Training   PT Next Visit Plan continue towards LTGs   Consulted and Agree with Plan of Care Patient;Family member/caregiver   Family Member Consulted wife        Problem List Patient Active Problem List   Diagnosis Date Noted  . Liver fibrosis ([place]) 10/07/2015  . Diarrhea 08/14/2014  . Dehydration 10/02/2013  . Fever 10/02/2013  . Altered mental status 10/01/2013  . Encephalopathy, toxic 10/01/2013  . Encephalopathy, metabolic 32/35/5732  . CVA (cerebral vascular accident) ([place]) 09/28/2013  . FTT (failure to thrive) in adult 09/28/2013  . Acute confusional state 09/28/2013  . Ulcer of sacral region, stage 3 ([place]) 09/26/2013  . S/P BKA (below knee amputation) bilateral ([place]) 09/08/2013  . ESRD on hemodialysis ([place]) 05/09/2013  . TIA (transient ischemic attack) 02/20/2013  . Acute blood loss anemia 10/28/2012  . Chronic combined systolic (EF 20%) and grade 2diastolic congestive heart failure 10/28/2012  . Obstipation 10/28/2012  . GI bleed 10/27/2012  . Mass in rectum 10/27/2012  . DM (diabetes mellitus) type I controlled with renal manifestation ([place]) 09/08/2012  . LVH (left ventricular hypertrophy)-severe concentric 06/13/2012  . Anemia due  to chronic illness 06/12/2012  . Hyperlipidemia 01/30/2011  . CHOLELITHIASIS 08/01/2010  . BENIGN PROSTATIC HYPERTROPHY 08/01/2010  . CEREBROVASCULAR ACCIDENT, HX OF 08/06/2009  . Depression 03/18/2009  . Acute combined systolic and diastolic heart failure, NYHA class 2-EF 45% 03/18/2009  . NEPHROLITHIASIS, HX OF 03/18/2009  . Morbid obesity ([place]) 03/25/2007  . Essential hypertension 03/25/2007  . GERD 03/25/2007  . Chronic hepatitis C without hepatic coma ([place]) 03/25/2007     Bjorn Loser, PTA  10/21/2015, 4:18 PM [facility] [address] [place], [place], [address] Phone: [phone]   Fax:  [phone]  Name: ANDERSON MIDDLEBROOKS MRN: [id number] Date of Birth: 08-May-1951

## 2015-10-22 DIAGNOSIS — E1122 Type 2 diabetes mellitus with diabetic chronic kidney disease: Secondary | ICD-10-CM | POA: Diagnosis not present

## 2015-10-22 DIAGNOSIS — N186 End stage renal disease: Secondary | ICD-10-CM | POA: Diagnosis not present

## 2015-10-22 DIAGNOSIS — D631 Anemia in chronic kidney disease: Secondary | ICD-10-CM | POA: Diagnosis not present

## 2015-10-22 DIAGNOSIS — N2581 Secondary hyperparathyroidism of renal origin: Secondary | ICD-10-CM | POA: Diagnosis not present

## 2015-10-23 ENCOUNTER — Ambulatory Visit: Payer: Medicare Other | Admitting: Physical Therapy

## 2015-10-23 DIAGNOSIS — R2991 Unspecified symptoms and signs involving the musculoskeletal system: Secondary | ICD-10-CM | POA: Diagnosis not present

## 2015-10-23 DIAGNOSIS — R2689 Other abnormalities of gait and mobility: Secondary | ICD-10-CM

## 2015-10-23 DIAGNOSIS — Z7409 Other reduced mobility: Secondary | ICD-10-CM | POA: Diagnosis not present

## 2015-10-23 DIAGNOSIS — Z89512 Acquired absence of left leg below knee: Secondary | ICD-10-CM | POA: Diagnosis not present

## 2015-10-23 DIAGNOSIS — R531 Weakness: Secondary | ICD-10-CM

## 2015-10-23 DIAGNOSIS — M79641 Pain in right hand: Secondary | ICD-10-CM | POA: Diagnosis not present

## 2015-10-23 DIAGNOSIS — Z89511 Acquired absence of right leg below knee: Secondary | ICD-10-CM

## 2015-10-23 DIAGNOSIS — R6889 Other general symptoms and signs: Secondary | ICD-10-CM

## 2015-10-23 DIAGNOSIS — R5381 Other malaise: Secondary | ICD-10-CM

## 2015-10-23 DIAGNOSIS — M24669 Ankylosis, unspecified knee: Secondary | ICD-10-CM

## 2015-10-23 DIAGNOSIS — R29818 Other symptoms and signs involving the nervous system: Secondary | ICD-10-CM | POA: Diagnosis not present

## 2015-10-23 NOTE — Therapy (Signed)
[facility] [address] [place], [place], [address] Phone: [phone]   Fax:  [phone]  Physical Therapy Treatment  Patient Details  Name: Oscar Castillo MRN: [id number] Date of Birth: 03/13/1951 Referring Provider: [name], MD  Encounter Date: 10/23/2015      PT End of Session - 10/23/15 1613    Visit Number 33   Number of Visits 40   Date for PT Re-Evaluation 11/15/15   Authorization Type Medicare Do G-Code & progress reports every 10 visits   PT Start Time 1238   PT Stop Time 1316   PT Time Calculation (min) 38 min   Equipment Utilized During Treatment Gait belt   Activity Tolerance Patient tolerated treatment well;Patient limited by pain   Behavior During Therapy Agitated  b/c he had to rush to/from prosthesis appt.      Past Medical History  Diagnosis Date  . ESRD on hemodialysis ([place]) 05/05/2007    ESRD due to DM/HTN. Started dialysis in November 2013.  HD TTS at [facility] on [address].  [name] BACK PAIN, LUMBAR, CHRONIC 08/06/2009  . BENIGN PROSTATIC HYPERTROPHY 08/01/2010  . CEREBROVASCULAR ACCIDENT, HX OF 08/06/2009  . CHOLELITHIASIS 08/01/2010  . CONGESTIVE HEART FAILURE 03/18/2009  . DEPRESSION 03/18/2009  . DIABETES MELLITUS, TYPE II 03/25/2007  . ERECTILE DYSFUNCTION 03/25/2007  . GERD 03/25/2007  . HEPATITIS C, HX OF 03/25/2007  . HYPERTENSION 03/25/2007  . Morbid obesity ([place]) 03/25/2007  . NEPHROLITHIASIS, HX OF 03/18/2009  . Complication of anesthesia     wife states pt had trouble waking up with his last surgery in Nov., 2014  . History of Clostridium difficile   . Hemorrhoids   . Anemia   . Antral ulcer 2014    small    Past Surgical History  Procedure Laterality Date  . Nephrectomy      partial RR  . Av fistula placement  06/14/2012    Procedure: ARTERIOVENOUS (AV) FISTULA CREATION;  Surgeon: Angelia Mould, MD;  Location: [facility] OR;  Service: Vascular;  Laterality: Left;  Left  basilic vein transposition with fistula.  . Tibia im nail insertion Left 09/09/2012    Procedure: INTRAMEDULLARY (IM) NAIL TIBIAL;  Surgeon: Johnny Bridge, MD;  Location: [place];  Service: Orthopedics;  Laterality: Left;  left tibial nail and open reduction internal fixation left fibula fracture  . Orif fibula fracture Left 09/09/2012    Procedure: OPEN REDUCTION INTERNAL FIXATION (ORIF) FIBULA FRACTURE;  Surgeon: Johnny Bridge, MD;  Location: [place];  Service: Orthopedics;  Laterality: Left;  . Colonoscopy N/A 10/28/2012    Procedure: COLONOSCOPY;  Surgeon: Jeryl Columbia, MD;  Location: [facility] ENDOSCOPY;  Service: Endoscopy;  Laterality: N/A;  . Esophagogastroduodenoscopy N/A 11/02/2012    Procedure: ESOPHAGOGASTRODUODENOSCOPY (EGD);  Surgeon: Cleotis Nipper, MD;  Location: [facility] ENDOSCOPY;  Service: Endoscopy;  Laterality: N/A;  . Colonoscopy N/A 11/02/2012    Procedure: COLONOSCOPY;  Surgeon: Cleotis Nipper, MD;  Location: [facility] ENDOSCOPY;  Service: Endoscopy;  Laterality: N/A;  . Colonoscopy N/A 11/03/2012    Procedure: COLONOSCOPY;  Surgeon: Cleotis Nipper, MD;  Location: [facility] ENDOSCOPY;  Service: Endoscopy;  Laterality: N/A;  . Givens capsule study N/A 11/04/2012    Procedure: GIVENS CAPSULE STUDY;  Surgeon: Cleotis Nipper, MD;  Location: [facility] ENDOSCOPY;  Service: Endoscopy;  Laterality: N/A;  . Enteroscopy N/A 11/08/2012    Procedure: ENTEROSCOPY;  Surgeon: Wonda Horner, MD;  Location: [place];  Service:  Endoscopy;  Laterality: N/A;  . Amputation Left 05/12/2013    Procedure: AMPUTATION RAY;  Surgeon: Newt Minion, MD;  Location: [place];  Service: Orthopedics;  Laterality: Left;  Left Foot 1st Ray Amputation  . Eye surgery Left     to remove scar tissue  . Amputation Left 06/09/2013    Procedure: AMPUTATION BELOW KNEE;  Surgeon: Newt Minion, MD;  Location: [place];  Service: Orthopedics;  Laterality: Left;  Left Below Knee Amputation and removal proximal screws IM tibial nail  . Hardware removal  Left 06/09/2013    Procedure: HARDWARE REMOVAL;  Surgeon: Newt Minion, MD;  Location: [place];  Service: Orthopedics;  Laterality: Left;  Left Below Knee Amputation  and Removal proximal screws IM tibial nail  . Amputation Right 09/08/2013    Procedure: AMPUTATION BELOW KNEE;  Surgeon: Newt Minion, MD;  Location: [place];  Service: Orthopedics;  Laterality: Right;  Right Below Knee Amputation  . Amputation Right 10/11/2013    Procedure: AMPUTATION BELOW KNEE;  Surgeon: Newt Minion, MD;  Location: [place];  Service: Orthopedics;  Laterality: Right;  Right Below Knee Amputation Revision    There were no vitals filed for this visit.  Visit Diagnosis:  Generalized weakness  Impaired functional mobility and activity tolerance  Status post bilateral below knee amputation (HCC)  Impaired transfers  Balance problems  Debility  Decreased functional activity tolerance  Decreased range of knee movement, unspecified laterality      Subjective Assessment - 10/23/15 1243    Subjective "Not to good. Had to rush to/from prosthesis appt."   Patient is accompained by: Family member  spouse   Patient Stated Goals To be able to walk more & hopefully by himself with prostheses   Currently in Pain? Yes   Pain Score 4    Pain Location Finger (Comment which one)   Pain Orientation Right;Left   Pain Descriptors / Indicators Sore   Pain Type Chronic pain   Pain Onset More than a month ago   Pain Score 4   Pain Location Arm   Pain Orientation Left   Pain Type Acute pain   Pain Onset In the past 7 days                         [facility] Adult PT Treatment/Exercise - 10/23/15 0001    Transfers   Transfers Stand Pivot Transfers  multiple 90*   Sit to Stand 2: Max assist  + 2   Sit to Stand Details Tactile cues for initiation;Tactile cues for sequencing;Tactile cues for weight shifting;Tactile cues for posture;Tactile cues for placement;Visual cues/gestures for  precautions/safety;Visual cues/gestures for sequencing   Stand Pivot Transfers: Patient Percentage 60%             Balance Exercises - 10/23/15 1609    Balance Exercises: Modified Standing   Standing Eyes Opened Narrow base of support (BOS)  Sitting on a stool working on WB and activity tolerance with Multi-level reaching, trunk flexion and rotation, and mini-crutches x10.           PT Education - 10/23/15 1610    Education provided Yes   Education Details transfer technique/ sequencing   Person(s) Educated Patient;Spouse   Methods Explanation;Demonstration;Tactile cues;Verbal cues   Comprehension Verbalized understanding;Returned demonstration;Need further instruction;Verbal cues required;Tactile cues required          PT Short Term Goals - 10/16/15 2209    PT SHORT TERM  GOAL #1   Title Patient sit to /from stand w/c to sink with moderate assist. (NEW Target Date: 08/30/2015)    Baseline MET 08/28/2015 to RW   Status Achieved   PT SHORT TERM GOAL #2   Title Patient & wife demonstrate / verbalize HEP / updated exercise program for fitness center. (Target Date: 07/26/2015)   Baseline 07/24/15: doing HEP on non dialysis days, seated ones every day and [facility] on [address] per family.    Status Achieved   PT SHORT TERM GOAL #3   Title Patient ambulates 5' with RW & prostheses with moderate assist (2 people for safety) (Target Date: 07/26/2015)   Baseline 07/24/15: pt able to ambulate short distance with mod assist of 2 people, did not reach 40 feet goal   Status Partially Met   PT SHORT TERM GOAL #4   Title Patient tolerates 30 seconds of standing with RW with minA. (Target Date: 08/30/2015)   Baseline MET 08/28/2015    Time 1   Period Months   Status Achieved   PT SHORT TERM GOAL #5   Title Patient ambulates 24' with RW / prostheses & turns 90* to sit in chair with armrests with moderate assist (can have 2nd person for safety). Target Date: 08/30/2015   Baseline Partially  MET 08/28/2015 Patient ambulates 15' with RW /prostheses & turns 90* to sit in chair with armrests with moderate assist (2nd person required for assist)   Time 1   Period Months   Status Partially Met   PT SHORT TERM GOAL #6   Title Patient verbalizes standing & HEP compliance >/= 4 days/wk. (Target Date: 10/16/2015)   Baseline Partially MET 10/16/2015 patient verbalizes compliance but limits stand reps due UE pain.   Time 1   Period Months   Status Achieved   PT SHORT TERM GOAL #7   Title Patient ambulates 6' with RW & prostheses with wife's assist with PT supervising.  (Target Date: 10/16/2015)   Baseline NOT MET 10/16/2015 Patient ambulates 20' with minA from PT on straight path only and modA on turns.   Time 1   Period Months   Status Not Met   PT SHORT TERM GOAL #8   Title Patient ambulates 56' with RW & prostheses with minA.  (Target Date: 10/16/2015)   Baseline NOT MET patient ambulates up to 20' before fatigues.   Time 1   Period Months   Status Not Met           PT Long Term Goals - 09/16/15 1145    PT LONG TERM GOAL #1   Title Patient and wife demonstrate proper prosthetic care. (Target Date: 09/20/2015)   Baseline MET 09/16/2015   Time 3   Period Months   Status Achieved   PT LONG TERM GOAL #2   Title Pt & wife verbalize & demonstrate ongoing HEP / fitness plan.  (Target Date: 09/20/2015)  NEW Target DATE 11/15/2015   Baseline Patient is independent in current HEP but PT continues to update. 09/16/2015   Time 3   Period Months   Status Partially Met   PT LONG TERM GOAL #3   Title stand pivot transfers with rolling walker & prostheses with supervision.  (Target Date: 09/20/2015) NEW Target DATE 11/15/2015   Baseline NOT MET 09/16/2015 Patient flucuatates between [place] for stand pivot transfers.    Time 3   Period Months   Status On-going   PT LONG TERM GOAL #4   Title  adjusts clothes & reaches 2" anteriorly while standing with RW support with minimal assist.  (Target  Date: 09/20/2015)  NEW Target DATE 11/15/2015   Baseline PARTIALLY MET 09/16/2015 Patient reaches 2" anteriorly with minA but unable to adjust his clothes.    Time 3   Period Months   Status On-going   PT LONG TERM GOAL #5   Title ambulate 57' with rolling walker & prostheses with minimal assist.  (Target Date: 09/20/2015)  NEW Target DATE 11/15/2015   Baseline Partially MET 09/16/2015 Patient ambulates 63' with RW & prostheses with minA for straight pathes & modA to turn to position to sit.    Time 3   Period Months   Status Revised               Plan - 10/23/15 1256    Clinical Impression Statement Pt very fatigued during session from already going to prosthesis appt.  Extensive A required for transfers. Tolerated >10 min on Modified standing activity on stool at [facility] level with dynamic upper trunk activity.   Pt will benefit from skilled therapeutic intervention in order to improve on the following deficits Abnormal gait;Decreased activity tolerance;Decreased balance;Decreased endurance;Decreased mobility;Decreased range of motion;Decreased strength;Postural dysfunction;Prosthetic Dependency   Rehab Potential Good   PT Frequency 2x / week   PT Duration Other (comment)  9 weeks (60 days)   PT Treatment/Interventions ADLs/Self Care Home Management;DME Instruction;Gait training;Stair training;Functional mobility training;Therapeutic activities;Therapeutic exercise;Balance training;Neuromuscular re-education;Patient/family education;Prosthetic Training   PT Next Visit Plan continue towards LTGs, seated stool trunk strengthening.   Consulted and Agree with Plan of Care Patient;Family member/caregiver   Family Member Consulted wife        Problem List Patient Active Problem List   Diagnosis Date Noted  . Liver fibrosis ([place]) 10/07/2015  . Diarrhea 08/14/2014  . Dehydration 10/02/2013  . Fever 10/02/2013  . Altered mental status 10/01/2013  . Encephalopathy, toxic 10/01/2013   . Encephalopathy, metabolic 63/84/6659  . CVA (cerebral vascular accident) ([place]) 09/28/2013  . FTT (failure to thrive) in adult 09/28/2013  . Acute confusional state 09/28/2013  . Ulcer of sacral region, stage 3 ([place]) 09/26/2013  . S/P BKA (below knee amputation) bilateral ([place]) 09/08/2013  . ESRD on hemodialysis ([place]) 05/09/2013  . TIA (transient ischemic attack) 02/20/2013  . Acute blood loss anemia 10/28/2012  . Chronic combined systolic (EF 93%) and grade 2diastolic congestive heart failure 10/28/2012  . Obstipation 10/28/2012  . GI bleed 10/27/2012  . Mass in rectum 10/27/2012  . DM (diabetes mellitus) type I controlled with renal manifestation ([place]) 09/08/2012  . LVH (left ventricular hypertrophy)-severe concentric 06/13/2012  . Anemia due to chronic illness 06/12/2012  . Hyperlipidemia 01/30/2011  . CHOLELITHIASIS 08/01/2010  . BENIGN PROSTATIC HYPERTROPHY 08/01/2010  . CEREBROVASCULAR ACCIDENT, HX OF 08/06/2009  . Depression 03/18/2009  . Acute combined systolic and diastolic heart failure, NYHA class 2-EF 45% 03/18/2009  . NEPHROLITHIASIS, HX OF 03/18/2009  . Morbid obesity ([place]) 03/25/2007  . Essential hypertension 03/25/2007  . GERD 03/25/2007  . Chronic hepatitis C without hepatic coma ([place]) 03/25/2007   Bjorn Loser, PTA  10/23/2015, 4:20 PM   [place] [address] [place], [place], [address] Phone: [phone]   Fax:  [phone]  Name: Oscar Castillo MRN: [id number] Date of Birth: Jul 19, 1951

## 2015-10-24 DIAGNOSIS — D631 Anemia in chronic kidney disease: Secondary | ICD-10-CM | POA: Diagnosis not present

## 2015-10-24 DIAGNOSIS — E1122 Type 2 diabetes mellitus with diabetic chronic kidney disease: Secondary | ICD-10-CM | POA: Diagnosis not present

## 2015-10-24 DIAGNOSIS — N2581 Secondary hyperparathyroidism of renal origin: Secondary | ICD-10-CM | POA: Diagnosis not present

## 2015-10-24 DIAGNOSIS — N186 End stage renal disease: Secondary | ICD-10-CM | POA: Diagnosis not present

## 2015-10-25 DIAGNOSIS — N186 End stage renal disease: Secondary | ICD-10-CM | POA: Diagnosis not present

## 2015-10-25 DIAGNOSIS — E1122 Type 2 diabetes mellitus with diabetic chronic kidney disease: Secondary | ICD-10-CM | POA: Diagnosis not present

## 2015-10-25 DIAGNOSIS — Z992 Dependence on renal dialysis: Secondary | ICD-10-CM | POA: Diagnosis not present

## 2015-10-26 DIAGNOSIS — N2581 Secondary hyperparathyroidism of renal origin: Secondary | ICD-10-CM | POA: Diagnosis not present

## 2015-10-26 DIAGNOSIS — N186 End stage renal disease: Secondary | ICD-10-CM | POA: Diagnosis not present

## 2015-10-26 DIAGNOSIS — E1122 Type 2 diabetes mellitus with diabetic chronic kidney disease: Secondary | ICD-10-CM | POA: Diagnosis not present

## 2015-10-29 DIAGNOSIS — E1122 Type 2 diabetes mellitus with diabetic chronic kidney disease: Secondary | ICD-10-CM | POA: Diagnosis not present

## 2015-10-29 DIAGNOSIS — N186 End stage renal disease: Secondary | ICD-10-CM | POA: Diagnosis not present

## 2015-10-29 DIAGNOSIS — N2581 Secondary hyperparathyroidism of renal origin: Secondary | ICD-10-CM | POA: Diagnosis not present

## 2015-10-30 ENCOUNTER — Encounter: Payer: Self-pay | Admitting: Physical Therapy

## 2015-10-30 ENCOUNTER — Ambulatory Visit: Payer: Medicare Other | Attending: Internal Medicine | Admitting: Physical Therapy

## 2015-10-30 DIAGNOSIS — R2689 Other abnormalities of gait and mobility: Secondary | ICD-10-CM

## 2015-10-30 DIAGNOSIS — R531 Weakness: Secondary | ICD-10-CM | POA: Diagnosis not present

## 2015-10-30 DIAGNOSIS — M6281 Muscle weakness (generalized): Secondary | ICD-10-CM | POA: Insufficient documentation

## 2015-10-31 DIAGNOSIS — E1122 Type 2 diabetes mellitus with diabetic chronic kidney disease: Secondary | ICD-10-CM | POA: Diagnosis not present

## 2015-10-31 DIAGNOSIS — N186 End stage renal disease: Secondary | ICD-10-CM | POA: Diagnosis not present

## 2015-10-31 DIAGNOSIS — N2581 Secondary hyperparathyroidism of renal origin: Secondary | ICD-10-CM | POA: Diagnosis not present

## 2015-10-31 NOTE — Therapy (Signed)
[facility] [address] [place], [place], [address] Phone: [phone]   Fax:  [phone]  Physical Therapy Treatment  Patient Details  Name: Oscar Castillo MRN: [id number] Date of Birth: May 23, 1951 Referring Provider: [name], MD  Encounter Date: 10/30/2015      PT End of Session - 10/30/15 1315    Visit Number 34   Number of Visits 40   Date for PT Re-Evaluation 11/15/15   Authorization Type Medicare Do G-Code & progress reports every 10 visits   PT Start Time 1230   PT Stop Time 1315   PT Time Calculation (min) 45 min   Equipment Utilized During Treatment Gait belt   Activity Tolerance Patient tolerated treatment well;Patient limited by pain   Behavior During Therapy [facility] for tasks assessed/performed  b/c he had to rush to/from prosthesis appt.      Past Medical History  Diagnosis Date  . ESRD on hemodialysis ([place]) 05/05/2007    ESRD due to DM/HTN. Started dialysis in November 2013.  HD TTS at [facility] on [place].  [name] BACK PAIN, LUMBAR, CHRONIC 08/06/2009  . BENIGN PROSTATIC HYPERTROPHY 08/01/2010  . CEREBROVASCULAR ACCIDENT, HX OF 08/06/2009  . CHOLELITHIASIS 08/01/2010  . CONGESTIVE HEART FAILURE 03/18/2009  . DEPRESSION 03/18/2009  . DIABETES MELLITUS, TYPE II 03/25/2007  . ERECTILE DYSFUNCTION 03/25/2007  . GERD 03/25/2007  . HEPATITIS C, HX OF 03/25/2007  . HYPERTENSION 03/25/2007  . Morbid obesity ([place]) 03/25/2007  . NEPHROLITHIASIS, HX OF 03/18/2009  . Complication of anesthesia     wife states pt had trouble waking up with his last surgery in Nov., 2014  . History of Clostridium difficile   . Hemorrhoids   . Anemia   . Antral ulcer 2014    small    Past Surgical History  Procedure Laterality Date  . Nephrectomy      partial RR  . Av fistula placement  06/14/2012    Procedure: ARTERIOVENOUS (AV) FISTULA CREATION;  Surgeon: Angelia Mould, MD;  [facility];  Service: Vascular;   Laterality: Left;  Left basilic vein transposition with fistula.  . Tibia im nail insertion Left 09/09/2012    Procedure: INTRAMEDULLARY (IM) NAIL TIBIAL;  Surgeon: Johnny Bridge, MD;  Location: [facility];  Service: Orthopedics;  Laterality: Left;  left tibial nail and open reduction internal fixation left fibula fracture  . Orif fibula fracture Left 09/09/2012    Procedure: OPEN REDUCTION INTERNAL FIXATION (ORIF) FIBULA FRACTURE;  Surgeon: Johnny Bridge, MD;  Location: [facility];  Service: Orthopedics;  Laterality: Left;  . Colonoscopy N/A 10/28/2012    Procedure: COLONOSCOPY;  Surgeon: Jeryl Columbia, MD;  Location: [facility] ENDOSCOPY;  Service: Endoscopy;  Laterality: N/A;  . Esophagogastroduodenoscopy N/A 11/02/2012    Procedure: ESOPHAGOGASTRODUODENOSCOPY (EGD);  Surgeon: Cleotis Nipper, MD;  Location: [facility] ENDOSCOPY;  Service: Endoscopy;  Laterality: N/A;  . Colonoscopy N/A 11/02/2012    Procedure: COLONOSCOPY;  Surgeon: Cleotis Nipper, MD;  Location: [facility] ENDOSCOPY;  Service: Endoscopy;  Laterality: N/A;  . Colonoscopy N/A 11/03/2012    Procedure: COLONOSCOPY;  Surgeon: Cleotis Nipper, MD;  Location: [facility] ENDOSCOPY;  Service: Endoscopy;  Laterality: N/A;  . Givens capsule study N/A 11/04/2012    Procedure: GIVENS CAPSULE STUDY;  Surgeon: Cleotis Nipper, MD;  Location: [facility] ENDOSCOPY;  Service: Endoscopy;  Laterality: N/A;  . Enteroscopy N/A 11/08/2012    Procedure: ENTEROSCOPY;  Surgeon: Wonda Horner, MD;  Location: [facility]  ENDOSCOPY;  Service: Endoscopy;  Laterality: N/A;  . Amputation Left 05/12/2013    Procedure: AMPUTATION RAY;  Surgeon: Newt Minion, MD;  Location: [place];  Service: Orthopedics;  Laterality: Left;  Left Foot 1st Ray Amputation  . Eye surgery Left     to remove scar tissue  . Amputation Left 06/09/2013    Procedure: AMPUTATION BELOW KNEE;  Surgeon: Newt Minion, MD;  Location: [place];  Service: Orthopedics;  Laterality: Left;  Left Below Knee Amputation and removal proximal screws IM tibial  nail  . Hardware removal Left 06/09/2013    Procedure: HARDWARE REMOVAL;  Surgeon: Newt Minion, MD;  Location: [place];  Service: Orthopedics;  Laterality: Left;  Left Below Knee Amputation  and Removal proximal screws IM tibial nail  . Amputation Right 09/08/2013    Procedure: AMPUTATION BELOW KNEE;  Surgeon: Newt Minion, MD;  Location: [place];  Service: Orthopedics;  Laterality: Right;  Right Below Knee Amputation  . Amputation Right 10/11/2013    Procedure: AMPUTATION BELOW KNEE;  Surgeon: Newt Minion, MD;  Location: [place];  Service: Orthopedics;  Laterality: Right;  Right Below Knee Amputation Revision    There were no vitals filed for this visit.     10/30/15 1230  Transfers  Transfers Sit to Stand;Stand to Sit;Stand Pivot Transfers  Sit to Stand 4: Min assist;With upper extremity assist;With armrests;From chair/3-in-1 (w/c to RW)  Sit to Stand Details Verbal cues for sequencing;Verbal cues for technique;Verbal cues for precautions/safety;Manual facilitation for weight shifting  Stand to Sit 4: Min assist;With upper extremity assist;With armrests;To chair/3-in-1 (RW to w/c or chair with armrests)  Stand to Sit Details (indicate cue type and reason) Verbal cues for precautions/safety;Verbal cues for technique;Verbal cues for sequencing;Manual facilitation for weight shifting;Tactile cues for weight shifting;Tactile cues for posture;Tactile cues for sequencing  Ambulation/Gait  Ambulation/Gait Yes  Ambulation/Gait Assistance 3: Mod assist (2nd person near for safety)  Ambulation/Gait Assistance Details PT cued on upright posture prior to turning; patient turned 90* to position to sit  Ambulation Distance (Feet) 20 Feet (15', 20' X 2)  Assistive device Prostheses;Rolling walker  Gait Pattern Decreased step length - right;Decreased step length - left;Decreased stride length;Right flexed knee in stance;Left flexed knee in stance;Trunk flexed;Narrow base of support  Knee/Hip Exercises:  Aerobic  Nustep Level 5 X 8 minutes with UEs & LEs with cues on maximal ROM  Prosthetics  Current prosthetic wear tolerance (days/week)  7 days  Current prosthetic wear tolerance (#hours/day)  90% of awake hours, including dialysis  Residual limb condition  no open areas, normal color & moisture                                 PT Short Term Goals - 10/16/15 2209    PT SHORT TERM GOAL #1   Title Patient sit to /from stand w/c to sink with moderate assist. (NEW Target Date: 08/30/2015)    Baseline MET 08/28/2015 to RW   Status Achieved   PT SHORT TERM GOAL #2   Title Patient & wife demonstrate / verbalize HEP / updated exercise program for fitness center. (Target Date: 07/26/2015)   Baseline 07/24/15: doing HEP on non dialysis days, seated ones every day and [facility] [place] per family.    Status Achieved   PT SHORT TERM GOAL #3   Title Patient ambulates 62' with RW & prostheses with moderate assist (  2 people for safety) (Target Date: 07/26/2015)   Baseline 07/24/15: pt able to ambulate short distance with mod assist of 2 people, did not reach 40 feet goal   Status Partially Met   PT SHORT TERM GOAL #4   Title Patient tolerates 30 seconds of standing with RW with minA. (Target Date: 08/30/2015)   Baseline MET 08/28/2015    Time 1   Period Months   Status Achieved   PT SHORT TERM GOAL #5   Title Patient ambulates 37' with RW / prostheses & turns 90* to sit in chair with armrests with moderate assist (can have 2nd person for safety). Target Date: 08/30/2015   Baseline Partially MET 08/28/2015 Patient ambulates 15' with RW /prostheses & turns 90* to sit in chair with armrests with moderate assist (2nd person required for assist)   Time 1   Period Months   Status Partially Met   PT SHORT TERM GOAL #6   Title Patient verbalizes standing & HEP compliance >/= 4 days/wk. (Target Date: 10/16/2015)   Baseline Partially MET 10/16/2015 patient verbalizes compliance but  limits stand reps due UE pain.   Time 1   Period Months   Status Achieved   PT SHORT TERM GOAL #7   Title Patient ambulates 66' with RW & prostheses with wife's assist with PT supervising.  (Target Date: 10/16/2015)   Baseline NOT MET 10/16/2015 Patient ambulates 20' with minA from PT on straight path only and modA on turns.   Time 1   Period Months   Status Not Met   PT SHORT TERM GOAL #8   Title Patient ambulates 20' with RW & prostheses with minA.  (Target Date: 10/16/2015)   Baseline NOT MET patient ambulates up to 20' before fatigues.   Time 1   Period Months   Status Not Met           PT Long Term Goals - 09/16/15 1145    PT LONG TERM GOAL #1   Title Patient and wife demonstrate proper prosthetic care. (Target Date: 09/20/2015)   Baseline MET 09/16/2015   Time 3   Period Months   Status Achieved   PT LONG TERM GOAL #2   Title Pt & wife verbalize & demonstrate ongoing HEP / fitness plan.  (Target Date: 09/20/2015)  NEW Target DATE 11/15/2015   Baseline Patient is independent in current HEP but PT continues to update. 09/16/2015   Time 3   Period Months   Status Partially Met   PT LONG TERM GOAL #3   Title stand pivot transfers with rolling walker & prostheses with supervision.  (Target Date: 09/20/2015) NEW Target DATE 11/15/2015   Baseline NOT MET 09/16/2015 Patient flucuatates between [place] for stand pivot transfers.    Time 3   Period Months   Status On-going   PT LONG TERM GOAL #4   Title adjusts clothes & reaches 2" anteriorly while standing with RW support with minimal assist.  (Target Date: 09/20/2015)  NEW Target DATE 11/15/2015   Baseline PARTIALLY MET 09/16/2015 Patient reaches 2" anteriorly with minA but unable to adjust his clothes.    Time 3   Period Months   Status On-going   PT LONG TERM GOAL #5   Title ambulate 14' with rolling walker & prostheses with minimal assist.  (Target Date: 09/20/2015)  NEW Target DATE 11/15/2015   Baseline Partially MET 09/16/2015  Patient ambulates 79' with RW & prostheses with minA for straight pathes & modA to  turn to position to sit.    Time 3   Period Months   Status Revised               Plan - 10/30/15 1430    Clinical Impression Statement Patient needed less assistance from PT today compared to last few sessions. Next few sessions needs to focus on wife's ability to safely assist patient.   Rehab Potential Good   PT Frequency 2x / week   PT Duration Other (comment)  9 weeks (60 days)   PT Treatment/Interventions ADLs/Self Care Home Management;DME Instruction;Gait training;Stair training;Functional mobility training;Therapeutic activities;Therapeutic exercise;Balance training;Neuromuscular re-education;Patient/family education;Prosthetic Training   PT Next Visit Plan continue towards LTGs, seated stool trunk strengthening.   Consulted and Agree with Plan of Care Patient;Family member/caregiver   Family Member Consulted wife      Patient will benefit from skilled therapeutic intervention in order to improve the following deficits and impairments:  Abnormal gait, Decreased activity tolerance, Decreased balance, Decreased endurance, Decreased mobility, Decreased range of motion, Decreased strength, Postural dysfunction, Prosthetic Dependency  Visit Diagnosis: Muscle weakness (generalized)  Other abnormalities of gait and mobility     Problem List Patient Active Problem List   Diagnosis Date Noted  . Liver fibrosis ([place]) 10/07/2015  . Diarrhea 08/14/2014  . Dehydration 10/02/2013  . Fever 10/02/2013  . Altered mental status 10/01/2013  . Encephalopathy, toxic 10/01/2013  . Encephalopathy, metabolic 62/37/6283  . CVA (cerebral vascular accident) ([place]) 09/28/2013  . FTT (failure to thrive) in adult 09/28/2013  . Acute confusional state 09/28/2013  . Ulcer of sacral region, stage 3 ([place]) 09/26/2013  . S/P BKA (below knee amputation) bilateral ([place]) 09/08/2013  . ESRD on hemodialysis ([place])  05/09/2013  . TIA (transient ischemic attack) 02/20/2013  . Acute blood loss anemia 10/28/2012  . Chronic combined systolic (EF 15%) and grade 2diastolic congestive heart failure 10/28/2012  . Obstipation 10/28/2012  . GI bleed 10/27/2012  . Mass in rectum 10/27/2012  . DM (diabetes mellitus) type I controlled with renal manifestation ([place]) 09/08/2012  . LVH (left ventricular hypertrophy)-severe concentric 06/13/2012  . Anemia due to chronic illness 06/12/2012  . Hyperlipidemia 01/30/2011  . CHOLELITHIASIS 08/01/2010  . BENIGN PROSTATIC HYPERTROPHY 08/01/2010  . CEREBROVASCULAR ACCIDENT, HX OF 08/06/2009  . Depression 03/18/2009  . Acute combined systolic and diastolic heart failure, NYHA class 2-EF 45% 03/18/2009  . NEPHROLITHIASIS, HX OF 03/18/2009  . Morbid obesity ([place]) 03/25/2007  . Essential hypertension 03/25/2007  . GERD 03/25/2007  . Chronic hepatitis C without hepatic coma ([place]) 03/25/2007    Vic Esco PT, DPT 10/31/2015, 9:14 PM  [facility] [address] [place], [place], [address] Phone: [phone]   Fax:  [phone]  Name: Oscar Castillo MRN: [id number] Date of Birth: May 29, 1951

## 2015-11-01 ENCOUNTER — Encounter: Payer: Self-pay | Admitting: Physical Therapy

## 2015-11-01 ENCOUNTER — Ambulatory Visit: Payer: Medicare Other | Admitting: Physical Therapy

## 2015-11-01 DIAGNOSIS — R2689 Other abnormalities of gait and mobility: Secondary | ICD-10-CM

## 2015-11-01 DIAGNOSIS — R531 Weakness: Secondary | ICD-10-CM | POA: Diagnosis not present

## 2015-11-01 DIAGNOSIS — M6281 Muscle weakness (generalized): Secondary | ICD-10-CM | POA: Diagnosis not present

## 2015-11-01 NOTE — Therapy (Signed)
[facility] [address] [place], [place], [address] Phone: [phone]   Fax:  [phone]  Physical Therapy Treatment  Patient Details  Name: Oscar Castillo MRN: [id number] Date of Birth: 08-11-1950 Referring Provider: [name], MD  Encounter Date: 11/01/2015      PT End of Session - 11/01/15 0939    Visit Number 35   Number of Visits 40   Date for PT Re-Evaluation 11/15/15   Authorization Type Medicare Do G-Code & progress reports every 10 visits   PT Start Time 0934   PT Stop Time 1016   PT Time Calculation (min) 42 min   Equipment Utilized During Treatment Gait belt   Activity Tolerance Patient tolerated treatment well;Patient limited by pain   Behavior During Therapy [facility] for tasks assessed/performed  b/c he had to rush to/from prosthesis appt.      Past Medical History  Diagnosis Date  . ESRD on hemodialysis ([place]) 05/05/2007    ESRD due to DM/HTN. Started dialysis in November 2013.  HD TTS at [facility] on [place] [place].  [name] BACK PAIN, LUMBAR, CHRONIC 08/06/2009  . BENIGN PROSTATIC HYPERTROPHY 08/01/2010  . CEREBROVASCULAR ACCIDENT, HX OF 08/06/2009  . CHOLELITHIASIS 08/01/2010  . CONGESTIVE HEART FAILURE 03/18/2009  . DEPRESSION 03/18/2009  . DIABETES MELLITUS, TYPE II 03/25/2007  . ERECTILE DYSFUNCTION 03/25/2007  . GERD 03/25/2007  . HEPATITIS C, HX OF 03/25/2007  . HYPERTENSION 03/25/2007  . Morbid obesity ([place]) 03/25/2007  . NEPHROLITHIASIS, HX OF 03/18/2009  . Complication of anesthesia     wife states pt had trouble waking up with his last surgery in Nov., 2014  . History of Clostridium difficile   . Hemorrhoids   . Anemia   . Antral ulcer 2014    small    Past Surgical History  Procedure Laterality Date  . Nephrectomy      partial RR  . Av fistula placement  06/14/2012    Procedure: ARTERIOVENOUS (AV) FISTULA CREATION;  Surgeon: Angelia Mould, MD;  Location: [facility] OR;  Service: Vascular;   Laterality: Left;  Left basilic vein transposition with fistula.  . Tibia im nail insertion Left 09/09/2012    Procedure: INTRAMEDULLARY (IM) NAIL TIBIAL;  Surgeon: Johnny Bridge, MD;  Location: [place];  Service: Orthopedics;  Laterality: Left;  left tibial nail and open reduction internal fixation left fibula fracture  . Orif fibula fracture Left 09/09/2012    Procedure: OPEN REDUCTION INTERNAL FIXATION (ORIF) FIBULA FRACTURE;  Surgeon: Johnny Bridge, MD;  Location: [place];  Service: Orthopedics;  Laterality: Left;  . Colonoscopy N/A 10/28/2012    Procedure: COLONOSCOPY;  Surgeon: Jeryl Columbia, MD;  Location: [facility] ENDOSCOPY;  Service: Endoscopy;  Laterality: N/A;  . Esophagogastroduodenoscopy N/A 11/02/2012    Procedure: ESOPHAGOGASTRODUODENOSCOPY (EGD);  Surgeon: Cleotis Nipper, MD;  Location: [facility] ENDOSCOPY;  Service: Endoscopy;  Laterality: N/A;  . Colonoscopy N/A 11/02/2012    Procedure: COLONOSCOPY;  Surgeon: Cleotis Nipper, MD;  Location[facility] ENDOSCOPY;  Service: Endoscopy;  Laterality: N/A;  . Colonoscopy N/A 11/03/2012    Procedure: COLONOSCOPY;  Surgeon: Cleotis Nipper, MD;  Location: [facility] ENDOSCOPY;  Service: Endoscopy;  Laterality: N/A;  . Givens capsule study N/A 11/04/2012    Procedure: GIVENS CAPSULE STUDY;  Surgeon: Cleotis Nipper, MD;  Location: [facility] ENDOSCOPY;  Service: Endoscopy;  Laterality: N/A;  . Enteroscopy N/A 11/08/2012    Procedure: ENTEROSCOPY;  Surgeon: Wonda Horner, MD;  Location: [facility]  ENDOSCOPY;  Service: Endoscopy;  Laterality: N/A;  . Amputation Left 05/12/2013    Procedure: AMPUTATION RAY;  Surgeon: Newt Minion, MD;  Location: [place];  Service: Orthopedics;  Laterality: Left;  Left Foot 1st Ray Amputation  . Eye surgery Left     to remove scar tissue  . Amputation Left 06/09/2013    Procedure: AMPUTATION BELOW KNEE;  Surgeon: Newt Minion, MD;  Location: [place];  Service: Orthopedics;  Laterality: Left;  Left Below Knee Amputation and removal proximal screws IM tibial  nail  . Hardware removal Left 06/09/2013    Procedure: HARDWARE REMOVAL;  Surgeon: Newt Minion, MD;  Location: [place];  Service: Orthopedics;  Laterality: Left;  Left Below Knee Amputation  and Removal proximal screws IM tibial nail  . Amputation Right 09/08/2013    Procedure: AMPUTATION BELOW KNEE;  Surgeon: Newt Minion, MD;  Location: [place];  Service: Orthopedics;  Laterality: Right;  Right Below Knee Amputation  . Amputation Right 10/11/2013    Procedure: AMPUTATION BELOW KNEE;  Surgeon: Newt Minion, MD;  Location: [place];  Service: Orthopedics;  Laterality: Right;  Right Below Knee Amputation Revision    There were no vitals filed for this visit.      Subjective Assessment - 11/01/15 0936    Subjective "I feel half way descent today".  No falls. No change in finger pain on both hands.    Patient is accompained by: Family member  spouse   Limitations Lifting;Walking;Standing   Patient Stated Goals To be able to walk more & hopefully by himself with prostheses   Currently in Pain? Yes   Pain Score 2    Pain Location Finger (Comment which one)   Pain Orientation Right;Left   Pain Descriptors / Indicators Sore   Pain Type Chronic pain   Pain Onset More than a month ago   Pain Frequency Constant   Aggravating Factors  dialysis   Pain Relieving Factors creame sometimes, has also had dry weight adjusted starting yesterday- too soon to see if that will help per his report            [facility] Adult PT Treatment/Exercise - 11/01/15 0940    Transfers   Transfers Sit to Stand;Stand to Sit;Stand Pivot Transfers   Sit to Stand 3: Mod assist;With upper extremity assist;With armrests;From chair/3-in-1   Sit to Stand Details Verbal cues for sequencing;Verbal cues for technique;Verbal cues for precautions/safety;Manual facilitation for weight shifting   Stand to Sit 3: Mod assist;With upper extremity assist;With armrests;To chair/3-in-1   Stand to Sit Details (indicate cue type and reason)  Verbal cues for precautions/safety;Verbal cues for technique;Verbal cues for sequencing;Manual facilitation for weight shifting;Tactile cues for weight shifting;Tactile cues for posture;Tactile cues for sequencing   Stand Pivot Transfers 3: Mod assist  min from second person   Stand Pivot Transfers: Patient Percentage 70%  70-75%   Stand Pivot Transfer Details (indicate cue type and reason) cues on sequencing and technique, cues to advance feet and walker in sequence. performed x 3 reps with gait trials, and 1 rep after gait trial for chair to wheelchair..    Ambulation/Gait   Ambulation/Gait Yes   Ambulation/Gait Assistance 3: Mod assist  second person for safety only   Ambulation/Gait Assistance Details cues on posture, correct step length and terminal knee extension in stance phase. less cues needed today vs previous session this PTA worked with pt.    Ambulation Distance (Feet) 20 Feet  x1, 22  x1   Assistive device Prostheses;Rolling walker   Gait Pattern Decreased step length - right;Decreased step length - left;Decreased stride length;Right flexed knee in stance;Left flexed knee in stance;Trunk flexed;Narrow base of support   Ambulation Surface Level;Indoor   Prosthetics   Current prosthetic wear tolerance (days/week)  7 days   Current prosthetic wear tolerance (#hours/day)  90% of awake hours, including dialysis   Residual limb condition  no open areas, normal color & moisture     In parallel bars: Min assist for sit<>stand x 2 reps (to get on/off stool) with UE support on parallel bars Curl ups with arms across chest x 20 reps Forward flexion then up into trunk extension x 20 reps          PT Short Term Goals - 10/16/15 2209    PT SHORT TERM GOAL #1   Title Patient sit to /from stand w/c to sink with moderate assist. (NEW Target Date: 08/30/2015)    Baseline MET 08/28/2015 to RW   Status Achieved   PT SHORT TERM GOAL #2   Title Patient & wife demonstrate / verbalize HEP /  updated exercise program for fitness center. (Target Date: 07/26/2015)   Baseline 07/24/15: doing HEP on non dialysis days, seated ones every day and [facility] on [place] per family.    Status Achieved   PT SHORT TERM GOAL #3   Title Patient ambulates 72' with RW & prostheses with moderate assist (2 people for safety) (Target Date: 07/26/2015)   Baseline 07/24/15: pt able to ambulate short distance with mod assist of 2 people, did not reach 40 feet goal   Status Partially Met   PT SHORT TERM GOAL #4   Title Patient tolerates 30 seconds of standing with RW with minA. (Target Date: 08/30/2015)   Baseline MET 08/28/2015    Time 1   Period Months   Status Achieved   PT SHORT TERM GOAL #5   Title Patient ambulates 42' with RW / prostheses & turns 90* to sit in chair with armrests with moderate assist (can have 2nd person for safety). Target Date: 08/30/2015   Baseline Partially MET 08/28/2015 Patient ambulates 15' with RW /prostheses & turns 90* to sit in chair with armrests with moderate assist (2nd person required for assist)   Time 1   Period Months   Status Partially Met   PT SHORT TERM GOAL #6   Title Patient verbalizes standing & HEP compliance >/= 4 days/wk. (Target Date: 10/16/2015)   Baseline Partially MET 10/16/2015 patient verbalizes compliance but limits stand reps due UE pain.   Time 1   Period Months   Status Achieved   PT SHORT TERM GOAL #7   Title Patient ambulates 35' with RW & prostheses with wife's assist with PT supervising.  (Target Date: 10/16/2015)   Baseline NOT MET 10/16/2015 Patient ambulates 20' with minA from PT on straight path only and modA on turns.   Time 1   Period Months   Status Not Met   PT SHORT TERM GOAL #8   Title Patient ambulates 4' with RW & prostheses with minA.  (Target Date: 10/16/2015)   Baseline NOT MET patient ambulates up to 20' before fatigues.   Time 1   Period Months   Status Not Met           PT Long Term Goals - 09/16/15 1145    PT  LONG TERM GOAL #1   Title Patient and wife demonstrate proper prosthetic care. (  Target Date: 09/20/2015)   Baseline MET 09/16/2015   Time 3   Period Months   Status Achieved   PT LONG TERM GOAL #2   Title Pt & wife verbalize & demonstrate ongoing HEP / fitness plan.  (Target Date: 09/20/2015)  NEW Target DATE 11/15/2015   Baseline Patient is independent in current HEP but PT continues to update. 09/16/2015   Time 3   Period Months   Status Partially Met   PT LONG TERM GOAL #3   Title stand pivot transfers with rolling walker & prostheses with supervision.  (Target Date: 09/20/2015) NEW Target DATE 11/15/2015   Baseline NOT MET 09/16/2015 Patient flucuatates between [place] for stand pivot transfers.    Time 3   Period Months   Status On-going   PT LONG TERM GOAL #4   Title adjusts clothes & reaches 2" anteriorly while standing with RW support with minimal assist.  (Target Date: 09/20/2015)  NEW Target DATE 11/15/2015   Baseline PARTIALLY MET 09/16/2015 Patient reaches 2" anteriorly with minA but unable to adjust his clothes.    Time 3   Period Months   Status On-going   PT LONG TERM GOAL #5   Title ambulate 84' with rolling walker & prostheses with minimal assist.  (Target Date: 09/20/2015)  NEW Target DATE 11/15/2015   Baseline Partially MET 09/16/2015 Patient ambulates 12' with RW & prostheses with minA for straight pathes & modA to turn to position to sit.    Time 3   Period Months   Status Revised           Plan - 11/01/15 0939    Clinical Impression Statement Skilled session continued to focus on gait and transfers with prostheses/RW, with less overall assistance needed with both. Continued to work on core strengthening as well. Pt is making steady progress toward goals.    Rehab Potential Good   PT Frequency 2x / week   PT Duration Other (comment)  9 weeks (60 days)   PT Treatment/Interventions ADLs/Self Care Home Management;DME Instruction;Gait training;Stair  training;Functional mobility training;Therapeutic activities;Therapeutic exercise;Balance training;Neuromuscular re-education;Patient/family education;Prosthetic Training   PT Next Visit Plan continue towards LTGs, seated stool trunk strengthening.   Consulted and Agree with Plan of Care Patient;Family member/caregiver   Family Member Consulted wife      Patient will benefit from skilled therapeutic intervention in order to improve the following deficits and impairments:  Abnormal gait, Decreased activity tolerance, Decreased balance, Decreased endurance, Decreased mobility, Decreased range of motion, Decreased strength, Postural dysfunction, Prosthetic Dependency  Visit Diagnosis: Muscle weakness (generalized)  Other abnormalities of gait and mobility     Problem List Patient Active Problem List   Diagnosis Date Noted  . Liver fibrosis ([place]) 10/07/2015  . Diarrhea 08/14/2014  . Dehydration 10/02/2013  . Fever 10/02/2013  . Altered mental status 10/01/2013  . Encephalopathy, toxic 10/01/2013  . Encephalopathy, metabolic 16/04/9603  . CVA (cerebral vascular accident) ([place]) 09/28/2013  . FTT (failure to thrive) in adult 09/28/2013  . Acute confusional state 09/28/2013  . Ulcer of sacral region, stage 3 ([place]) 09/26/2013  . S/P BKA (below knee amputation) bilateral ([place]) 09/08/2013  . ESRD on hemodialysis ([place]) 05/09/2013  . TIA (transient ischemic attack) 02/20/2013  . Acute blood loss anemia 10/28/2012  . Chronic combined systolic (EF 54%) and grade 2diastolic congestive heart failure 10/28/2012  . Obstipation 10/28/2012  . GI bleed 10/27/2012  . Mass in rectum 10/27/2012  . DM (diabetes mellitus) type I controlled  with renal manifestation ([place]) 09/08/2012  . LVH (left ventricular hypertrophy)-severe concentric 06/13/2012  . Anemia due to chronic illness 06/12/2012  . Hyperlipidemia 01/30/2011  . CHOLELITHIASIS 08/01/2010  . BENIGN PROSTATIC HYPERTROPHY 08/01/2010  .  CEREBROVASCULAR ACCIDENT, HX OF 08/06/2009  . Depression 03/18/2009  . Acute combined systolic and diastolic heart failure, NYHA class 2-EF 45% 03/18/2009  . NEPHROLITHIASIS, HX OF 03/18/2009  . Morbid obesity ([place]) 03/25/2007  . Essential hypertension 03/25/2007  . GERD 03/25/2007  . Chronic hepatitis C without hepatic coma ([place]) 03/25/2007   Willow Ora, PTA, [place] [address], [place], [place] [address] [phone] 11/01/2015, 1:56 PM   Name: AIDYNN KRENN MRN: [id number] Date of Birth: [birth year]

## 2015-11-02 DIAGNOSIS — N186 End stage renal disease: Secondary | ICD-10-CM | POA: Diagnosis not present

## 2015-11-02 DIAGNOSIS — E1122 Type 2 diabetes mellitus with diabetic chronic kidney disease: Secondary | ICD-10-CM | POA: Diagnosis not present

## 2015-11-02 DIAGNOSIS — N2581 Secondary hyperparathyroidism of renal origin: Secondary | ICD-10-CM | POA: Diagnosis not present

## 2015-11-04 ENCOUNTER — Ambulatory Visit: Payer: Medicare Other | Admitting: Physical Therapy

## 2015-11-04 ENCOUNTER — Encounter: Payer: Self-pay | Admitting: Physical Therapy

## 2015-11-04 ENCOUNTER — Other Ambulatory Visit: Payer: Self-pay | Admitting: Internal Medicine

## 2015-11-04 DIAGNOSIS — M6281 Muscle weakness (generalized): Secondary | ICD-10-CM

## 2015-11-04 DIAGNOSIS — R531 Weakness: Secondary | ICD-10-CM

## 2015-11-04 DIAGNOSIS — R2689 Other abnormalities of gait and mobility: Secondary | ICD-10-CM | POA: Diagnosis not present

## 2015-11-05 DIAGNOSIS — N2581 Secondary hyperparathyroidism of renal origin: Secondary | ICD-10-CM | POA: Diagnosis not present

## 2015-11-05 DIAGNOSIS — E1122 Type 2 diabetes mellitus with diabetic chronic kidney disease: Secondary | ICD-10-CM | POA: Diagnosis not present

## 2015-11-05 DIAGNOSIS — N186 End stage renal disease: Secondary | ICD-10-CM | POA: Diagnosis not present

## 2015-11-05 NOTE — Therapy (Signed)
[facility] [address] [place], [place], [address] Phone: [phone]   Fax:  [phone]  Physical Therapy Treatment  Patient Details  Name: Oscar Castillo MRN: [id number] Date of Birth: 1951-05-20 Referring Provider: [name], MD  Encounter Date: 11/04/2015      PT End of Session - 11/04/15 1400    Visit Number 36   Number of Visits 40   Date for PT Re-Evaluation 11/15/15   Authorization Type Medicare Do G-Code & progress reports every 10 visits   PT Start Time 1323   PT Stop Time 1401   PT Time Calculation (min) 38 min   Equipment Utilized During Treatment Gait belt   Activity Tolerance Patient tolerated treatment well;Patient limited by pain   Behavior During Therapy [facility] for tasks assessed/performed  b/c he had to rush to/from prosthesis appt.      Past Medical History  Diagnosis Date  . ESRD on hemodialysis ([place]) 05/05/2007    ESRD due to DM/HTN. Started dialysis in November 2013.  HD TTS at [facility] on [address] [place].  [name] BACK PAIN, LUMBAR, CHRONIC 08/06/2009  . BENIGN PROSTATIC HYPERTROPHY 08/01/2010  . CEREBROVASCULAR ACCIDENT, HX OF 08/06/2009  . CHOLELITHIASIS 08/01/2010  . CONGESTIVE HEART FAILURE 03/18/2009  . DEPRESSION 03/18/2009  . DIABETES MELLITUS, TYPE II 03/25/2007  . ERECTILE DYSFUNCTION 03/25/2007  . GERD 03/25/2007  . HEPATITIS C, HX OF 03/25/2007  . HYPERTENSION 03/25/2007  . Morbid obesity ([place]) 03/25/2007  . NEPHROLITHIASIS, HX OF 03/18/2009  . Complication of anesthesia     wife states pt had trouble waking up with his last surgery in Nov., 2014  . History of Clostridium difficile   . Hemorrhoids   . Anemia   . Antral ulcer 2014    small    Past Surgical History  Procedure Laterality Date  . Nephrectomy      partial RR  . Av fistula placement  06/14/2012    Procedure: ARTERIOVENOUS (AV) FISTULA CREATION;  Surgeon: Angelia Mould, MD;  Location: [facility] OR;  Service: Vascular;   Laterality: Left;  Left basilic vein transposition with fistula.  . Tibia im nail insertion Left 09/09/2012    Procedure: INTRAMEDULLARY (IM) NAIL TIBIAL;  Surgeon: Johnny Bridge, MD;  Location: [place];  Service: Orthopedics;  Laterality: Left;  left tibial nail and open reduction internal fixation left fibula fracture  . Orif fibula fracture Left 09/09/2012    Procedure: OPEN REDUCTION INTERNAL FIXATION (ORIF) FIBULA FRACTURE;  Surgeon: Johnny Bridge, MD;  Location: [place];  Service: Orthopedics;  Laterality: Left;  . Colonoscopy N/A 10/28/2012    Procedure: COLONOSCOPY;  Surgeon: Jeryl Columbia, MD;  Location: [facility] ENDOSCOPY;  Service: Endoscopy;  Laterality: N/A;  . Esophagogastroduodenoscopy N/A 11/02/2012    Procedure: ESOPHAGOGASTRODUODENOSCOPY (EGD);  Surgeon: Cleotis Nipper, MD;  Location: [facility] ENDOSCOPY;  Service: Endoscopy;  Laterality: N/A;  . Colonoscopy N/A 11/02/2012    Procedure: COLONOSCOPY;  Surgeon: Cleotis Nipper, MD;  Location: [facility] ENDOSCOPY;  Service: Endoscopy;  Laterality: N/A;  . Colonoscopy N/A 11/03/2012    Procedure: COLONOSCOPY;  Surgeon: Cleotis Nipper, MD;  Location: [facility] ENDOSCOPY;  Service: Endoscopy;  Laterality: N/A;  . Givens capsule study N/A 11/04/2012    Procedure: GIVENS CAPSULE STUDY;  Surgeon: Cleotis Nipper, MD;  Location: [facility] ENDOSCOPY;  Service: Endoscopy;  Laterality: N/A;  . Enteroscopy N/A 11/08/2012    Procedure: ENTEROSCOPY;  Surgeon: Wonda Horner, MD;  Location: [facility]  ENDOSCOPY;  Service: Endoscopy;  Laterality: N/A;  . Amputation Left 05/12/2013    Procedure: AMPUTATION RAY;  Surgeon: Newt Minion, MD;  Location: [place];  Service: Orthopedics;  Laterality: Left;  Left Foot 1st Ray Amputation  . Eye surgery Left     to remove scar tissue  . Amputation Left 06/09/2013    Procedure: AMPUTATION BELOW KNEE;  Surgeon: Newt Minion, MD;  Location: [place];  Service: Orthopedics;  Laterality: Left;  Left Below Knee Amputation and removal proximal screws IM tibial  nail  . Hardware removal Left 06/09/2013    Procedure: HARDWARE REMOVAL;  Surgeon: Newt Minion, MD;  Location: [place];  Service: Orthopedics;  Laterality: Left;  Left Below Knee Amputation  and Removal proximal screws IM tibial nail  . Amputation Right 09/08/2013    Procedure: AMPUTATION BELOW KNEE;  Surgeon: Newt Minion, MD;  Location: [place];  Service: Orthopedics;  Laterality: Right;  Right Below Knee Amputation  . Amputation Right 10/11/2013    Procedure: AMPUTATION BELOW KNEE;  Surgeon: Newt Minion, MD;  Location: [place];  Service: Orthopedics;  Laterality: Right;  Right Below Knee Amputation Revision    There were no vitals filed for this visit.      Subjective Assessment - 11/04/15 1325    Subjective (p) No falls or issues.    Patient is accompained by: (p) Family member   Limitations (p) Lifting;Walking;Standing   Patient Stated Goals (p) To be able to walk more & hopefully by himself with prostheses   Currently in Pain? (p) Yes   Pain Score (p) 2    Pain Location (p) Finger (Comment which one)   Pain Orientation (p) Right;Left   Pain Descriptors / Indicators (p) Sore   Pain Type (p) Chronic pain   Pain Onset (p) More than a month ago   Pain Frequency (p) Constant   Aggravating Factors  (p) dialysis & neuropathy   Pain Relieving Factors (p) cream, dialysis   Multiple Pain Sites (p) No     Prosthetic Training with bilateral Transtibial Amputation prostheses: PT checked liners prior to gait and right liner was not snug against distal limb (may be due to slippage) so PT redonned prior to gait. Sit to / from stand w/c and chairs with armrests with [place]. Patient ambulated with RW 10' including 90* turn to position to sit with minA. Patient repeated 3 additional 10' with 58* turns gait including one on low pile carpet with wife's assist with PT contact assist for safety. PT cueing both pt and wife on technique.  Patient too fatigued for 5th gait after sit to stand.  Scooting  transfers with good clearance of buttocks. NuStep level 4 with BUEs & BLEs for 8 min with cues on maximal ROM.                               PT Short Term Goals - 10/16/15 2209    PT SHORT TERM GOAL #1   Title Patient sit to /from stand w/c to sink with moderate assist. (NEW Target Date: 08/30/2015)    Baseline MET 08/28/2015 to RW   Status Achieved   PT SHORT TERM GOAL #2   Title Patient & wife demonstrate / verbalize HEP / updated exercise program for fitness center. (Target Date: 07/26/2015)   Baseline 07/24/15: doing HEP on non dialysis days, seated ones every day and [facility] on [address]  per family.    Status Achieved   PT SHORT TERM GOAL #3   Title Patient ambulates 77' with RW & prostheses with moderate assist (2 people for safety) (Target Date: 07/26/2015)   Baseline 07/24/15: pt able to ambulate short distance with mod assist of 2 people, did not reach 40 feet goal   Status Partially Met   PT SHORT TERM GOAL #4   Title Patient tolerates 30 seconds of standing with RW with minA. (Target Date: 08/30/2015)   Baseline MET 08/28/2015    Time 1   Period Months   Status Achieved   PT SHORT TERM GOAL #5   Title Patient ambulates 40' with RW / prostheses & turns 90* to sit in chair with armrests with moderate assist (can have 2nd person for safety). Target Date: 08/30/2015   Baseline Partially MET 08/28/2015 Patient ambulates 15' with RW /prostheses & turns 90* to sit in chair with armrests with moderate assist (2nd person required for assist)   Time 1   Period Months   Status Partially Met   PT SHORT TERM GOAL #6   Title Patient verbalizes standing & HEP compliance >/= 4 days/wk. (Target Date: 10/16/2015)   Baseline Partially MET 10/16/2015 patient verbalizes compliance but limits stand reps due UE pain.   Time 1   Period Months   Status Achieved   PT SHORT TERM GOAL #7   Title Patient ambulates 16' with RW & prostheses with wife's assist with PT supervising.   (Target Date: 10/16/2015)   Baseline NOT MET 10/16/2015 Patient ambulates 20' with minA from PT on straight path only and modA on turns.   Time 1   Period Months   Status Not Met   PT SHORT TERM GOAL #8   Title Patient ambulates 18' with RW & prostheses with minA.  (Target Date: 10/16/2015)   Baseline NOT MET patient ambulates up to 20' before fatigues.   Time 1   Period Months   Status Not Met           PT Long Term Goals - 09/16/15 1145    PT LONG TERM GOAL #1   Title Patient and wife demonstrate proper prosthetic care. (Target Date: 09/20/2015)   Baseline MET 09/16/2015   Time 3   Period Months   Status Achieved   PT LONG TERM GOAL #2   Title Pt & wife verbalize & demonstrate ongoing HEP / fitness plan.  (Target Date: 09/20/2015)  NEW Target DATE 11/15/2015   Baseline Patient is independent in current HEP but PT continues to update. 09/16/2015   Time 3   Period Months   Status Partially Met   PT LONG TERM GOAL #3   Title stand pivot transfers with rolling walker & prostheses with supervision.  (Target Date: 09/20/2015) NEW Target DATE 11/15/2015   Baseline NOT MET 09/16/2015 Patient flucuatates between [place] for stand pivot transfers.    Time 3   Period Months   Status On-going   PT LONG TERM GOAL #4   Title adjusts clothes & reaches 2" anteriorly while standing with RW support with minimal assist.  (Target Date: 09/20/2015)  NEW Target DATE 11/15/2015   Baseline PARTIALLY MET 09/16/2015 Patient reaches 2" anteriorly with minA but unable to adjust his clothes.    Time 3   Period Months   Status On-going   PT LONG TERM GOAL #5   Title ambulate 37' with rolling walker & prostheses with minimal assist.  (Target Date: 09/20/2015)  NEW Target DATE 11/15/2015   Baseline Partially MET 09/16/2015 Patient ambulates 70' with RW & prostheses with minA for straight pathes & modA to turn to position to sit.    Time 3   Period Months   Status Revised               Plan - 11/04/15  1401    Clinical Impression Statement Wife was able to assist patient for short distance (10') gait including turning 90* to position to sit with PT contact assist for safety & confidence. Patient fatigued after 4th gait but should have longer recovery times between ambulation bouts at home which should help.    Rehab Potential Good   PT Frequency 2x / week   PT Duration Other (comment)  9 weeks (60 days)   PT Treatment/Interventions ADLs/Self Care Home Management;DME Instruction;Gait training;Stair training;Functional mobility training;Therapeutic activities;Therapeutic exercise;Balance training;Neuromuscular re-education;Patient/family education;Prosthetic Training   PT Next Visit Plan continue towards LTGs, wife to assist with gait under PT supervision including on carpet   Consulted and Agree with Plan of Care Patient;Family member/caregiver   Family Member Consulted wife      Patient will benefit from skilled therapeutic intervention in order to improve the following deficits and impairments:  Abnormal gait, Decreased activity tolerance, Decreased balance, Decreased endurance, Decreased mobility, Decreased range of motion, Decreased strength, Postural dysfunction, Prosthetic Dependency  Visit Diagnosis: Muscle weakness (generalized)  Other abnormalities of gait and mobility  Generalized weakness     Problem List Patient Active Problem List   Diagnosis Date Noted  . Liver fibrosis ([place]) 10/07/2015  . Diarrhea 08/14/2014  . Dehydration 10/02/2013  . Fever 10/02/2013  . Altered mental status 10/01/2013  . Encephalopathy, toxic 10/01/2013  . Encephalopathy, metabolic 68/06/7516  . CVA (cerebral vascular accident) ([place]) 09/28/2013  . FTT (failure to thrive) in adult 09/28/2013  . Acute confusional state 09/28/2013  . Ulcer of sacral region, stage 3 ([place]) 09/26/2013  . S/P BKA (below knee amputation) bilateral ([place]) 09/08/2013  . ESRD on hemodialysis ([place]) 05/09/2013  . TIA  (transient ischemic attack) 02/20/2013  . Acute blood loss anemia 10/28/2012  . Chronic combined systolic (EF 00%) and grade 2diastolic congestive heart failure 10/28/2012  . Obstipation 10/28/2012  . GI bleed 10/27/2012  . Mass in rectum 10/27/2012  . DM (diabetes mellitus) type I controlled with renal manifestation ([place]) 09/08/2012  . LVH (left ventricular hypertrophy)-severe concentric 06/13/2012  . Anemia due to chronic illness 06/12/2012  . Hyperlipidemia 01/30/2011  . CHOLELITHIASIS 08/01/2010  . BENIGN PROSTATIC HYPERTROPHY 08/01/2010  . CEREBROVASCULAR ACCIDENT, HX OF 08/06/2009  . Depression 03/18/2009  . Acute combined systolic and diastolic heart failure, NYHA class 2-EF 45% 03/18/2009  . NEPHROLITHIASIS, HX OF 03/18/2009  . Morbid obesity ([place]) 03/25/2007  . Essential hypertension 03/25/2007  . GERD 03/25/2007  . Chronic hepatitis C without hepatic coma ([place]) 03/25/2007    Mikenzi Raysor PT, DPT 11/05/2015, 2:17 PM  [facility] [address] [place], [place], [address] Phone: [phone]   Fax:  [phone]  Name: Oscar Castillo MRN: [id number] Date of Birth: July 11, 1951

## 2015-11-06 ENCOUNTER — Encounter: Payer: Self-pay | Admitting: Physical Therapy

## 2015-11-06 ENCOUNTER — Ambulatory Visit: Payer: Medicare Other | Admitting: Physical Therapy

## 2015-11-06 DIAGNOSIS — R2689 Other abnormalities of gait and mobility: Secondary | ICD-10-CM | POA: Diagnosis not present

## 2015-11-06 DIAGNOSIS — M6281 Muscle weakness (generalized): Secondary | ICD-10-CM | POA: Diagnosis not present

## 2015-11-06 DIAGNOSIS — R531 Weakness: Secondary | ICD-10-CM | POA: Diagnosis not present

## 2015-11-06 NOTE — Therapy (Signed)
[facility] [address] [place], [place], [address] Phone: [phone]   Fax:  [phone]  Physical Therapy Treatment  Patient Details  Name: Oscar Castillo MRN: [id number] Date of Birth: 1950-08-31 Referring Provider: [name], MD  Encounter Date: 11/06/2015      PT End of Session - 11/06/15 1105    Visit Number 37   Number of Visits 40   Date for PT Re-Evaluation 11/15/15   Authorization Type Medicare Do G-Code & progress reports every 10 visits   PT Start Time 1105   PT Stop Time 1135   PT Time Calculation (min) 30 min   Equipment Utilized During Treatment Gait belt   Activity Tolerance Patient tolerated treatment well;Patient limited by pain   Behavior During Therapy [facility] [address] for tasks assessed/performed  b/c he had to rush to/from prosthesis appt.      Past Medical History  Diagnosis Date  . ESRD on hemodialysis ([place]) 05/05/2007    ESRD due to DM/HTN. Started dialysis in November 2013.  HD TTS at [facility] on [place].  [name] BACK PAIN, LUMBAR, CHRONIC 08/06/2009  . BENIGN PROSTATIC HYPERTROPHY 08/01/2010  . CEREBROVASCULAR ACCIDENT, HX OF 08/06/2009  . CHOLELITHIASIS 08/01/2010  . CONGESTIVE HEART FAILURE 03/18/2009  . DEPRESSION 03/18/2009  . DIABETES MELLITUS, TYPE II 03/25/2007  . ERECTILE DYSFUNCTION 03/25/2007  . GERD 03/25/2007  . HEPATITIS C, HX OF 03/25/2007  . HYPERTENSION 03/25/2007  . Morbid obesity ([place]) 03/25/2007  . NEPHROLITHIASIS, HX OF 03/18/2009  . Complication of anesthesia     wife states pt had trouble waking up with his last surgery in Nov., 2014  . History of Clostridium difficile   . Hemorrhoids   . Anemia   . Antral ulcer 2014    small    Past Surgical History  Procedure Laterality Date  . Nephrectomy      partial RR  . Av fistula placement  06/14/2012    Procedure: ARTERIOVENOUS (AV) FISTULA CREATION;  Surgeon: Angelia Mould, MD;  Location[facility] OR;  Service: Vascular;   Laterality: Left;  Left basilic vein transposition with fistula.  . Tibia im nail insertion Left 09/09/2012    Procedure: INTRAMEDULLARY (IM) NAIL TIBIAL;  Surgeon: Johnny Bridge, MD;  Location: [facility];  Service: Orthopedics;  Laterality: Left;  left tibial nail and open reduction internal fixation left fibula fracture  . Orif fibula fracture Left 09/09/2012    Procedure: OPEN REDUCTION INTERNAL FIXATION (ORIF) FIBULA FRACTURE;  Surgeon: Johnny Bridge, MD;  Location: [facility];  Service: Orthopedics;  Laterality: Left;  . Colonoscopy N/A 10/28/2012    Procedure: COLONOSCOPY;  Surgeon: Jeryl Columbia, MD;  Location: [facility] ENDOSCOPY;  Service: Endoscopy;  Laterality: N/A;  . Esophagogastroduodenoscopy N/A 11/02/2012    Procedure: ESOPHAGOGASTRODUODENOSCOPY (EGD);  Surgeon: Cleotis Nipper, MD;  Location: [facility] ENDOSCOPY;  Service: Endoscopy;  Laterality: N/A;  . Colonoscopy N/A 11/02/2012    Procedure: COLONOSCOPY;  Surgeon: Cleotis Nipper, MD;  Location: [facility] ENDOSCOPY;  Service: Endoscopy;  Laterality: N/A;  . Colonoscopy N/A 11/03/2012    Procedure: COLONOSCOPY;  Surgeon: Cleotis Nipper, MD;  Location: [facility] ENDOSCOPY;  Service: Endoscopy;  Laterality: N/A;  . Givens capsule study N/A 11/04/2012    Procedure: GIVENS CAPSULE STUDY;  Surgeon: Cleotis Nipper, MD;  Location: [facility] ENDOSCOPY;  Service: Endoscopy;  Laterality: N/A;  . Enteroscopy N/A 11/08/2012    Procedure: ENTEROSCOPY;  Surgeon: Wonda Horner, MD;  Location: [facility]  ENDOSCOPY;  Service: Endoscopy;  Laterality: N/A;  . Amputation Left 05/12/2013    Procedure: AMPUTATION RAY;  Surgeon: Newt Minion, MD;  Location: [facility];  Service: Orthopedics;  Laterality: Left;  Left Foot 1st Ray Amputation  . Eye surgery Left     to remove scar tissue  . Amputation Left 06/09/2013    Procedure: AMPUTATION BELOW KNEE;  Surgeon: Newt Minion, MD;  Location: [facility];  Service: Orthopedics;  Laterality: Left;  Left Below Knee Amputation and removal proximal screws IM tibial  nail  . Hardware removal Left 06/09/2013    Procedure: HARDWARE REMOVAL;  Surgeon: Newt Minion, MD;  Location: [facility];  Service: Orthopedics;  Laterality: Left;  Left Below Knee Amputation  and Removal proximal screws IM tibial nail  . Amputation Right 09/08/2013    Procedure: AMPUTATION BELOW KNEE;  Surgeon: Newt Minion, MD;  Location: [facility];  Service: Orthopedics;  Laterality: Right;  Right Below Knee Amputation  . Amputation Right 10/11/2013    Procedure: AMPUTATION BELOW KNEE;  Surgeon: Newt Minion, MD;  Location: [facility];  Service: Orthopedics;  Laterality: Right;  Right Below Knee Amputation Revision    There were no vitals filed for this visit.      Subjective Assessment - 11/06/15 1105    Subjective He walked in kitchen with his wife's assist with no issues.    Patient is accompained by: Family member   Limitations Lifting;Walking;Standing   Currently in Pain? Yes   Pain Score 2    Pain Location Finger (Comment which one)   Pain Orientation Right;Left   Pain Descriptors / Indicators Sore   Pain Type Chronic pain   Pain Onset More than a month ago   Pain Frequency Constant   Aggravating Factors  dialysis   Pain Relieving Factors cream, dry weight change   Multiple Pain Sites No      Prosthetic Training with bilateral Transtibial Amputation prostheses: PT checked liners prior to gait and both liners were not snug against distal limb (may be due to slippage) so PT redonned prior to gait. PT instructed pt & wife on need to check this and they verbalized understanding.  Sit to / from stand w/c and chairs with armrests with [place]. Patient ambulated with RW 10' including 90* turn to position to sit with minA with wife's assist with PT contact assist for safety. PT cueing both pt and wife on technique.  Patient reported difficulty sleeping last night with little sleep. Patient fatigued during session so PT ended session early. PT educated patient and wife on monitoring & increasing  frequency and distance once discharged with increases weekly or longer but only when consistent with current level. Both verbalized understanding. PT instructed in need to continue with exercises at home & [facility] with adding machines and classes as able.                              PT Short Term Goals - 10/16/15 2209    PT SHORT TERM GOAL #1   Title Patient sit to /from stand w/c to sink with moderate assist. (NEW Target Date: 08/30/2015)    Baseline MET 08/28/2015 to RW   Status Achieved   PT SHORT TERM GOAL #2   Title Patient & wife demonstrate / verbalize HEP / updated exercise program for fitness center. (Target Date: 07/26/2015)   Baseline 07/24/15: doing HEP on non dialysis days,  seated ones every day and [facility] on [place] per family.    Status Achieved   PT SHORT TERM GOAL #3   Title Patient ambulates 45' with RW & prostheses with moderate assist (2 people for safety) (Target Date: 07/26/2015)   Baseline 07/24/15: pt able to ambulate short distance with mod assist of 2 people, did not reach 40 feet goal   Status Partially Met   PT SHORT TERM GOAL #4   Title Patient tolerates 30 seconds of standing with RW with minA. (Target Date: 08/30/2015)   Baseline MET 08/28/2015    Time 1   Period Months   Status Achieved   PT SHORT TERM GOAL #5   Title Patient ambulates 10' with RW / prostheses & turns 90* to sit in chair with armrests with moderate assist (can have 2nd person for safety). Target Date: 08/30/2015   Baseline Partially MET 08/28/2015 Patient ambulates 15' with RW /prostheses & turns 90* to sit in chair with armrests with moderate assist (2nd person required for assist)   Time 1   Period Months   Status Partially Met   PT SHORT TERM GOAL #6   Title Patient verbalizes standing & HEP compliance >/= 4 days/wk. (Target Date: 10/16/2015)   Baseline Partially MET 10/16/2015 patient verbalizes compliance but limits stand reps due UE pain.   Time 1   Period  Months   Status Achieved   PT SHORT TERM GOAL #7   Title Patient ambulates 68' with RW & prostheses with wife's assist with PT supervising.  (Target Date: 10/16/2015)   Baseline NOT MET 10/16/2015 Patient ambulates 20' with minA from PT on straight path only and modA on turns.   Time 1   Period Months   Status Not Met   PT SHORT TERM GOAL #8   Title Patient ambulates 13' with RW & prostheses with minA.  (Target Date: 10/16/2015)   Baseline NOT MET patient ambulates up to 20' before fatigues.   Time 1   Period Months   Status Not Met           PT Long Term Goals - 11/06/15 1130    PT LONG TERM GOAL #1   Title Patient and wife demonstrate proper prosthetic care. (Target Date: 09/20/2015)   Baseline MET 09/16/2015   Time 3   Period Months   Status Achieved   PT LONG TERM GOAL #2   Title Pt & wife verbalize & demonstrate ongoing HEP / fitness plan.  (Target Date: 09/20/2015)  NEW Target DATE 11/15/2015   Baseline Patient is independent in current HEP but PT continues to update. 09/16/2015   Time 3   Period Months   Status Partially Met   PT LONG TERM GOAL #3   Title stand pivot transfers with rolling walker & prostheses with minA from wife.  (Target Date: 09/20/2015) NEW Target DATE 11/15/2015   Baseline NOT MET 09/16/2015 Patient flucuatates between [place] for stand pivot transfers.    Time 3   Period Months   Status On-going   PT LONG TERM GOAL #4   Title adjusts clothes & reaches 2" anteriorly while standing with RW support with minimal assist.  (Target Date: 09/20/2015)  NEW Target DATE 11/15/2015   Baseline PARTIALLY MET 09/16/2015 Patient reaches 2" anteriorly with minA but unable to adjust his clothes.    Time 3   Period Months   Status On-going   PT LONG TERM GOAL #5   Title ambulate 71' including  turning to sit with rolling walker & prostheses with minimal assist from wife safely.  (Target Date: 09/20/2015)  NEW Target DATE 11/15/2015   Baseline Partially MET 09/16/2015  Patient ambulates 3' with RW & prostheses with minA for straight pathes & modA to turn to position to sit.    Time 3   Period Months   Status On-going               Plan - 11/06/15 1140    Clinical Impression Statement Patient fatigued during session probably due to lack of sleep previous night per his report. Patient was still able to ambulate with wife's assistance 3 times safely which is improvement from past. Patient appears will be able to discharge next week and continue to work with wife on gait and HEP .    Rehab Potential Good   PT Frequency 2x / week   PT Duration Other (comment)  9 weeks (60 days)   PT Treatment/Interventions ADLs/Self Care Home Management;DME Instruction;Gait training;Stair training;Functional mobility training;Therapeutic activities;Therapeutic exercise;Balance training;Neuromuscular re-education;Patient/family education;Prosthetic Training   PT Next Visit Plan assess LTGs   Consulted and Agree with Plan of Care Patient;Family member/caregiver   Family Member Consulted wife      Patient will benefit from skilled therapeutic intervention in order to improve the following deficits and impairments:  Abnormal gait, Decreased activity tolerance, Decreased balance, Decreased endurance, Decreased mobility, Decreased range of motion, Decreased strength, Postural dysfunction, Prosthetic Dependency  Visit Diagnosis: Muscle weakness (generalized)  Other abnormalities of gait and mobility  Generalized weakness     Problem List Patient Active Problem List   Diagnosis Date Noted  . Liver fibrosis ([place]) 10/07/2015  . Diarrhea 08/14/2014  . Dehydration 10/02/2013  . Fever 10/02/2013  . Altered mental status 10/01/2013  . Encephalopathy, toxic 10/01/2013  . Encephalopathy, metabolic 91/47/8295  . CVA (cerebral vascular accident) ([place]) 09/28/2013  . FTT (failure to thrive) in adult 09/28/2013  . Acute confusional state 09/28/2013  . Ulcer of sacral  region, stage 3 ([place]) 09/26/2013  . S/P BKA (below knee amputation) bilateral ([place]) 09/08/2013  . ESRD on hemodialysis ([place]) 05/09/2013  . TIA (transient ischemic attack) 02/20/2013  . Acute blood loss anemia 10/28/2012  . Chronic combined systolic (EF 62%) and grade 2diastolic congestive heart failure 10/28/2012  . Obstipation 10/28/2012  . GI bleed 10/27/2012  . Mass in rectum 10/27/2012  . DM (diabetes mellitus) type I controlled with renal manifestation ([place]) 09/08/2012  . LVH (left ventricular hypertrophy)-severe concentric 06/13/2012  . Anemia due to chronic illness 06/12/2012  . Hyperlipidemia 01/30/2011  . CHOLELITHIASIS 08/01/2010  . BENIGN PROSTATIC HYPERTROPHY 08/01/2010  . CEREBROVASCULAR ACCIDENT, HX OF 08/06/2009  . Depression 03/18/2009  . Acute combined systolic and diastolic heart failure, NYHA class 2-EF 45% 03/18/2009  . NEPHROLITHIASIS, HX OF 03/18/2009  . Morbid obesity ([place]) 03/25/2007  . Essential hypertension 03/25/2007  . GERD 03/25/2007  . Chronic hepatitis C without hepatic coma ([place]) 03/25/2007    Kwesi Sangha  PT, DPT  11/07/2015, 10:41 AM  [facility] [address] [place], [place], [address] Phone: [phone]   Fax:  [phone]  Name: TAVISH GETTIS MRN: [id number] Date of Birth: 1951/03/29

## 2015-11-07 DIAGNOSIS — E1122 Type 2 diabetes mellitus with diabetic chronic kidney disease: Secondary | ICD-10-CM | POA: Diagnosis not present

## 2015-11-07 DIAGNOSIS — N2581 Secondary hyperparathyroidism of renal origin: Secondary | ICD-10-CM | POA: Diagnosis not present

## 2015-11-07 DIAGNOSIS — N186 End stage renal disease: Secondary | ICD-10-CM | POA: Diagnosis not present

## 2015-11-09 DIAGNOSIS — N186 End stage renal disease: Secondary | ICD-10-CM | POA: Diagnosis not present

## 2015-11-09 DIAGNOSIS — N2581 Secondary hyperparathyroidism of renal origin: Secondary | ICD-10-CM | POA: Diagnosis not present

## 2015-11-09 DIAGNOSIS — E1122 Type 2 diabetes mellitus with diabetic chronic kidney disease: Secondary | ICD-10-CM | POA: Diagnosis not present

## 2015-11-11 ENCOUNTER — Ambulatory Visit: Payer: Medicare Other | Admitting: Physical Therapy

## 2015-11-11 ENCOUNTER — Encounter: Payer: Self-pay | Admitting: Physical Therapy

## 2015-11-11 DIAGNOSIS — M6281 Muscle weakness (generalized): Secondary | ICD-10-CM | POA: Diagnosis not present

## 2015-11-11 DIAGNOSIS — R2689 Other abnormalities of gait and mobility: Secondary | ICD-10-CM | POA: Diagnosis not present

## 2015-11-11 DIAGNOSIS — R531 Weakness: Secondary | ICD-10-CM

## 2015-11-12 DIAGNOSIS — N2581 Secondary hyperparathyroidism of renal origin: Secondary | ICD-10-CM | POA: Diagnosis not present

## 2015-11-12 DIAGNOSIS — E1122 Type 2 diabetes mellitus with diabetic chronic kidney disease: Secondary | ICD-10-CM | POA: Diagnosis not present

## 2015-11-12 DIAGNOSIS — N186 End stage renal disease: Secondary | ICD-10-CM | POA: Diagnosis not present

## 2015-11-12 NOTE — Therapy (Signed)
[facility] [address] [place], [place], [address] Phone: [phone]   Fax:  [phone]  Physical Therapy Treatment  Patient Details  Name: Oscar Castillo MRN: [id number] Date of Birth: 1951-02-05 Referring Provider: [name], MD  Encounter Date: 11/11/2015      PT End of Session - 11/11/15 1154    Visit Number 38   Number of Visits 40   Date for PT Re-Evaluation 11/15/15   Authorization Type Medicare Do G-Code & progress reports every 10 visits   PT Start Time 1239   PT Stop Time 1318   PT Time Calculation (min) 39 min   Equipment Utilized During Treatment Gait belt   Activity Tolerance Patient tolerated treatment well;Patient limited by pain   Behavior During Therapy [facility] for tasks assessed/performed  b/c he had to rush to/from prosthesis appt.      Past Medical History  Diagnosis Date  . ESRD on hemodialysis ([place]) 05/05/2007    ESRD due to DM/HTN. Started dialysis in November 2013.  HD TTS at [facility] on [place].  [name] BACK PAIN, LUMBAR, CHRONIC 08/06/2009  . BENIGN PROSTATIC HYPERTROPHY 08/01/2010  . CEREBROVASCULAR ACCIDENT, HX OF 08/06/2009  . CHOLELITHIASIS 08/01/2010  . CONGESTIVE HEART FAILURE 03/18/2009  . DEPRESSION 03/18/2009  . DIABETES MELLITUS, TYPE II 03/25/2007  . ERECTILE DYSFUNCTION 03/25/2007  . GERD 03/25/2007  . HEPATITIS C, HX OF 03/25/2007  . HYPERTENSION 03/25/2007  . Morbid obesity ([place]) 03/25/2007  . NEPHROLITHIASIS, HX OF 03/18/2009  . Complication of anesthesia     wife states pt had trouble waking up with his last surgery in Nov., 2014  . History of Clostridium difficile   . Hemorrhoids   . Anemia   . Antral ulcer 2014    small    Past Surgical History  Procedure Laterality Date  . Nephrectomy      partial RR  . Av fistula placement  06/14/2012    Procedure: ARTERIOVENOUS (AV) FISTULA CREATION;  Surgeon: Angelia Mould, MD;  Location: [facility] OR;  Service: Vascular;   Laterality: Left;  Left basilic vein transposition with fistula.  . Tibia im nail insertion Left 09/09/2012    Procedure: INTRAMEDULLARY (IM) NAIL TIBIAL;  Surgeon: Johnny Bridge, MD;  Location: [place];  Service: Orthopedics;  Laterality: Left;  left tibial nail and open reduction internal fixation left fibula fracture  . Orif fibula fracture Left 09/09/2012    Procedure: OPEN REDUCTION INTERNAL FIXATION (ORIF) FIBULA FRACTURE;  Surgeon: Johnny Bridge, MD;  Location: [place];  Service: Orthopedics;  Laterality: Left;  . Colonoscopy N/A 10/28/2012    Procedure: COLONOSCOPY;  Surgeon: Jeryl Columbia, MD;  Location: [facility] ENDOSCOPY;  Service: Endoscopy;  Laterality: N/A;  . Esophagogastroduodenoscopy N/A 11/02/2012    Procedure: ESOPHAGOGASTRODUODENOSCOPY (EGD);  Surgeon: Cleotis Nipper, MD;  Location: [facility] ENDOSCOPY;  Service: Endoscopy;  Laterality: N/A;  . Colonoscopy N/A 11/02/2012    Procedure: COLONOSCOPY;  Surgeon: Cleotis Nipper, MD;  Location: [place] ENDOSCOPY;  Service: Endoscopy;  Laterality: N/A;  . Colonoscopy N/A 11/03/2012    Procedure: COLONOSCOPY;  Surgeon: Cleotis Nipper, MD;  Location: [facility] ENDOSCOPY;  Service: Endoscopy;  Laterality: N/A;  . Givens capsule study N/A 11/04/2012    Procedure: GIVENS CAPSULE STUDY;  Surgeon: Cleotis Nipper, MD;  Location: [facility] ENDOSCOPY;  Service: Endoscopy;  Laterality: N/A;  . Enteroscopy N/A 11/08/2012    Procedure: ENTEROSCOPY;  Surgeon: Wonda Horner, MD;  Location: [facility]  ENDOSCOPY;  Service: Endoscopy;  Laterality: N/A;  . Amputation Left 05/12/2013    Procedure: AMPUTATION RAY;  Surgeon: Newt Minion, MD;  Location: [place];  Service: Orthopedics;  Laterality: Left;  Left Foot 1st Ray Amputation  . Eye surgery Left     to remove scar tissue  . Amputation Left 06/09/2013    Procedure: AMPUTATION BELOW KNEE;  Surgeon: Newt Minion, MD;  Location: [place];  Service: Orthopedics;  Laterality: Left;  Left Below Knee Amputation and removal proximal screws IM tibial  nail  . Hardware removal Left 06/09/2013    Procedure: HARDWARE REMOVAL;  Surgeon: Newt Minion, MD;  Location: [place];  Service: Orthopedics;  Laterality: Left;  Left Below Knee Amputation  and Removal proximal screws IM tibial nail  . Amputation Right 09/08/2013    Procedure: AMPUTATION BELOW KNEE;  Surgeon: Newt Minion, MD;  Location: [place];  Service: Orthopedics;  Laterality: Right;  Right Below Knee Amputation  . Amputation Right 10/11/2013    Procedure: AMPUTATION BELOW KNEE;  Surgeon: Newt Minion, MD;  Location: [place];  Service: Orthopedics;  Laterality: Right;  Right Below Knee Amputation Revision    There were no vitals filed for this visit.      Subjective Assessment - 11/11/15 1241    Subjective Walked "some" with wife without issues.    Patient is accompained by: Family member   Limitations Lifting;Walking;Standing   Patient Stated Goals To be able to walk more & hopefully by himself with prostheses   Currently in Pain? Yes   Pain Score 2    Pain Location Finger (Comment which one)   Pain Orientation Left;Right   Pain Descriptors / Indicators Sore   Pain Type Chronic pain   Pain Onset More than a month ago   Pain Frequency Constant   Aggravating Factors  dialysis   Pain Relieving Factors cream,    Multiple Pain Sites No      Prosthetic Training with bilateral Transtibial Amputation prostheses: PT checked liners prior to gait and both liners were not snug against distal limb (may be due to slippage) so PT redonned prior to gait. PT instructed patient and wife in proper use of lotion only at night with removal in morning prior to donning prostheses and use of light coat of oil only over patella proximally to decrease friction with sitting. Too much oil can cause slippage and slippage decreases his ability to control prostheses. Both verbalized understanding.  Sit to / from stand w/c and chairs with armrests with [place]. Patient ambulated with RW 12' X 4 including 90* turn  to position to sit with minA with wife's assist with PT contact assist for safety. PT cueing both pt and wife on technique.   NuStep level 4 with BUEs & BLEs for 10 min with cues on maximal ROM.                                                      PT Short Term Goals - 10/16/15 2209    PT SHORT TERM GOAL #1   Title Patient sit to /from stand w/c to sink with moderate assist. (NEW Target Date: 08/30/2015)    Baseline MET 08/28/2015 to RW   Status Achieved   PT SHORT TERM GOAL #2   Title Patient &  wife demonstrate / verbalize HEP / updated exercise program for fitness center. (Target Date: 07/26/2015)   Baseline 07/24/15: doing HEP on non dialysis days, seated ones every day and [facility] on [place] per family.    Status Achieved   PT SHORT TERM GOAL #3   Title Patient ambulates 32' with RW & prostheses with moderate assist (2 people for safety) (Target Date: 07/26/2015)   Baseline 07/24/15: pt able to ambulate short distance with mod assist of 2 people, did not reach 40 feet goal   Status Partially Met   PT SHORT TERM GOAL #4   Title Patient tolerates 30 seconds of standing with RW with minA. (Target Date: 08/30/2015)   Baseline MET 08/28/2015    Time 1   Period Months   Status Achieved   PT SHORT TERM GOAL #5   Title Patient ambulates 57' with RW / prostheses & turns 90* to sit in chair with armrests with moderate assist (can have 2nd person for safety). Target Date: 08/30/2015   Baseline Partially MET 08/28/2015 Patient ambulates 15' with RW /prostheses & turns 90* to sit in chair with armrests with moderate assist (2nd person required for assist)   Time 1   Period Months   Status Partially Met   PT SHORT TERM GOAL #6   Title Patient verbalizes standing & HEP compliance >/= 4 days/wk. (Target Date: 10/16/2015)   Baseline Partially MET 10/16/2015 patient verbalizes compliance but limits stand reps due UE pain.   Time 1   Period Months    Status Achieved   PT SHORT TERM GOAL #7   Title Patient ambulates 43' with RW & prostheses with wife's assist with PT supervising.  (Target Date: 10/16/2015)   Baseline NOT MET 10/16/2015 Patient ambulates 20' with minA from PT on straight path only and modA on turns.   Time 1   Period Months   Status Not Met   PT SHORT TERM GOAL #8   Title Patient ambulates 66' with RW & prostheses with minA.  (Target Date: 10/16/2015)   Baseline NOT MET patient ambulates up to 20' before fatigues.   Time 1   Period Months   Status Not Met           PT Long Term Goals - 11/11/15 1232    PT LONG TERM GOAL #1   Title Patient and wife demonstrate proper prosthetic care. (Target Date: 09/20/2015)   Baseline MET 09/16/2015   Time 3   Period Months   Status Achieved   PT LONG TERM GOAL #2   Title Pt & wife verbalize & demonstrate ongoing HEP / fitness plan.  (Target Date: 09/20/2015)  NEW Target DATE 11/15/2015   Baseline Patient is independent in current HEP but PT continues to update. 09/16/2015   Time 3   Period Months   Status Partially Met   PT LONG TERM GOAL #3   Title stand pivot transfers with rolling walker & prostheses with minA from wife.  (Target Date: 09/20/2015) NEW Target DATE 11/15/2015   Baseline NOT MET 09/16/2015 Patient flucuatates between [place] for stand pivot transfers.    Time 3   Period Months   Status On-going   PT LONG TERM GOAL #4   Title adjusts clothes & reaches 2" anteriorly while standing with RW support with minimal assist.  (Target Date: 09/20/2015)  NEW Target DATE 11/15/2015   Baseline PARTIALLY MET 09/16/2015 Patient reaches 2" anteriorly with minA but unable to adjust his clothes.  Time 3   Period Months   Status On-going   PT LONG TERM GOAL #5   Title ambulate 10' including turning to sit with rolling walker & prostheses with minimal assist from wife safely.  (Target Date: 09/20/2015)  NEW Target DATE 11/15/2015   Baseline Partially MET 09/16/2015 Patient  ambulates 61' with RW & prostheses with minA for straight pathes & modA to turn to position to sit.    Time 3   Period Months   Status On-going               Plan - 11/11/15 1320    Clinical Impression Statement patient and his wife appear more comfortable with short distance gait. Patient continues to struggle with endurance which dialysis limits. However his overall mobility & activity tolerance has improved with this episode of care.    Rehab Potential Good   PT Frequency 2x / week   PT Duration Other (comment)  9 weeks (60 days)   PT Treatment/Interventions ADLs/Self Care Home Management;DME Instruction;Gait training;Stair training;Functional mobility training;Therapeutic activities;Therapeutic exercise;Balance training;Neuromuscular re-education;Patient/family education;Prosthetic Training   PT Next Visit Plan assess LTGs   Consulted and Agree with Plan of Care Patient;Family member/caregiver   Family Member Consulted wife      Patient will benefit from skilled therapeutic intervention in order to improve the following deficits and impairments:  Abnormal gait, Decreased activity tolerance, Decreased balance, Decreased endurance, Decreased mobility, Decreased range of motion, Decreased strength, Postural dysfunction, Prosthetic Dependency  Visit Diagnosis: Muscle weakness (generalized)  Other abnormalities of gait and mobility  Generalized weakness     Problem List Patient Active Problem List   Diagnosis Date Noted  . Liver fibrosis ([place]) 10/07/2015  . Diarrhea 08/14/2014  . Dehydration 10/02/2013  . Fever 10/02/2013  . Altered mental status 10/01/2013  . Encephalopathy, toxic 10/01/2013  . Encephalopathy, metabolic 16/94/5038  . CVA (cerebral vascular accident) ([place]) 09/28/2013  . FTT (failure to thrive) in adult 09/28/2013  . Acute confusional state 09/28/2013  . Ulcer of sacral region, stage 3 ([place]) 09/26/2013  . S/P BKA (below knee amputation) bilateral  ([place]) 09/08/2013  . ESRD on hemodialysis ([place]) 05/09/2013  . TIA (transient ischemic attack) 02/20/2013  . Acute blood loss anemia 10/28/2012  . Chronic combined systolic (EF 88%) and grade 2diastolic congestive heart failure 10/28/2012  . Obstipation 10/28/2012  . GI bleed 10/27/2012  . Mass in rectum 10/27/2012  . DM (diabetes mellitus) type I controlled with renal manifestation ([place]) 09/08/2012  . LVH (left ventricular hypertrophy)-severe concentric 06/13/2012  . Anemia due to chronic illness 06/12/2012  . Hyperlipidemia 01/30/2011  . CHOLELITHIASIS 08/01/2010  . BENIGN PROSTATIC HYPERTROPHY 08/01/2010  . CEREBROVASCULAR ACCIDENT, HX OF 08/06/2009  . Depression 03/18/2009  . Acute combined systolic and diastolic heart failure, NYHA class 2-EF 45% 03/18/2009  . NEPHROLITHIASIS, HX OF 03/18/2009  . Morbid obesity ([place]) 03/25/2007  . Essential hypertension 03/25/2007  . GERD 03/25/2007  . Chronic hepatitis C without hepatic coma ([place]) 03/25/2007    Tighe Gitto PT, DPT 11/12/2015, 12:00 PM  [facility] [address] [place], [place], [address] Phone: [phone]   Fax:  [phone]  Name: Oscar Castillo MRN: [id number] Date of Birth: [birth year]

## 2015-11-13 ENCOUNTER — Encounter: Payer: Self-pay | Admitting: Physical Therapy

## 2015-11-13 ENCOUNTER — Ambulatory Visit (INDEPENDENT_AMBULATORY_CARE_PROVIDER_SITE_OTHER): Payer: Medicare Other | Admitting: Internal Medicine

## 2015-11-13 ENCOUNTER — Ambulatory Visit: Payer: Medicare Other | Admitting: Physical Therapy

## 2015-11-13 ENCOUNTER — Encounter: Payer: Self-pay | Admitting: Internal Medicine

## 2015-11-13 VITALS — BP 128/70 | HR 85 | Temp 98.6°F | Resp 20 | Wt 210.0 lb

## 2015-11-13 DIAGNOSIS — R21 Rash and other nonspecific skin eruption: Secondary | ICD-10-CM | POA: Diagnosis not present

## 2015-11-13 DIAGNOSIS — R252 Cramp and spasm: Secondary | ICD-10-CM

## 2015-11-13 DIAGNOSIS — R2689 Other abnormalities of gait and mobility: Secondary | ICD-10-CM

## 2015-11-13 DIAGNOSIS — I10 Essential (primary) hypertension: Secondary | ICD-10-CM | POA: Diagnosis not present

## 2015-11-13 DIAGNOSIS — R531 Weakness: Secondary | ICD-10-CM | POA: Diagnosis not present

## 2015-11-13 DIAGNOSIS — M6281 Muscle weakness (generalized): Secondary | ICD-10-CM

## 2015-11-13 MED ORDER — KETOCONAZOLE 2 % EX CREA: 1.0000 "application " | TOPICAL_CREAM | Freq: Every day | CUTANEOUS | Status: DC

## 2015-11-13 NOTE — Progress Notes (Signed)
Subjective:    Patient ID: Oscar Castillo, male    DOB: 1951/05/24, 65 y.o.   MRN: [id number]  HPI  Here to f/u, c/o mild to mod itchy rash to biltat upper back and post arms for 2-3 wks, just cant stop itching, though no excoriations, and rash seems to stay, nontender, non raised, nonvesicular, and no other lip/tongue swelling or rash elsewhere.  Pt denies chest pain, increased sob or doe, wheezing, orthopnea, PND, increased LE swelling, palpitations, dizziness or syncope.  Pt denies new neurological symptoms such as new headache, or facial or extremity weakness or numbness but does have pain to the hands most days just after dialysis, lasts several hours.   Pt denies fever, wt loss, night sweats, loss of appetite, or other constitutional symptoms Past Medical History  Diagnosis Date  . ESRD on hemodialysis ([place]) 05/05/2007    ESRD due to DM/HTN. Started dialysis in November 2013.  HD TTS at [facility] on [place].  [name] BACK PAIN, LUMBAR, CHRONIC 08/06/2009  . BENIGN PROSTATIC HYPERTROPHY 08/01/2010  . CEREBROVASCULAR ACCIDENT, HX OF 08/06/2009  . CHOLELITHIASIS 08/01/2010  . CONGESTIVE HEART FAILURE 03/18/2009  . DEPRESSION 03/18/2009  . DIABETES MELLITUS, TYPE II 03/25/2007  . ERECTILE DYSFUNCTION 03/25/2007  . GERD 03/25/2007  . HEPATITIS C, HX OF 03/25/2007  . HYPERTENSION 03/25/2007  . Morbid obesity ([place]) 03/25/2007  . NEPHROLITHIASIS, HX OF 03/18/2009  . Complication of anesthesia     wife states pt had trouble waking up with his last surgery in Nov., 2014  . History of Clostridium difficile   . Hemorrhoids   . Anemia   . Antral ulcer 2014    small   Past Surgical History  Procedure Laterality Date  . Nephrectomy      partial RR  . Av fistula placement  06/14/2012    Procedure: ARTERIOVENOUS (AV) FISTULA CREATION;  Surgeon: Angelia Mould, MD;  Location: [facility] OR;  Service: Vascular;  Laterality: Left;  Left basilic vein transposition with fistula.  . Tibia im nail insertion Left  09/09/2012    Procedure: INTRAMEDULLARY (IM) NAIL TIBIAL;  Surgeon: Johnny Bridge, MD;  Location: [place];  Service: Orthopedics;  Laterality: Left;  left tibial nail and open reduction internal fixation left fibula fracture  . Orif fibula fracture Left 09/09/2012    Procedure: OPEN REDUCTION INTERNAL FIXATION (ORIF) FIBULA FRACTURE;  Surgeon: Johnny Bridge, MD;  Location: [place];  Service: Orthopedics;  Laterality: Left;  . Colonoscopy N/A 10/28/2012    Procedure: COLONOSCOPY;  Surgeon: Jeryl Columbia, MD;  Location: [facility] ENDOSCOPY;  Service: Endoscopy;  Laterality: N/A;  . Esophagogastroduodenoscopy N/A 11/02/2012    Procedure: ESOPHAGOGASTRODUODENOSCOPY (EGD);  Surgeon: Cleotis Nipper, MD;  Location: [facility] ENDOSCOPY;  Service: Endoscopy;  Laterality: N/A;  . Colonoscopy N/A 11/02/2012    Procedure: COLONOSCOPY;  Surgeon: Cleotis Nipper, MD;  Location: [facility] ENDOSCOPY;  Service: Endoscopy;  Laterality: N/A;  . Colonoscopy N/A 11/03/2012    Procedure: COLONOSCOPY;  Surgeon: Cleotis Nipper, MD;  Location: [facility] ENDOSCOPY;  Service: Endoscopy;  Laterality: N/A;  . Givens capsule study N/A 11/04/2012    Procedure: GIVENS CAPSULE STUDY;  Surgeon: Cleotis Nipper, MD;  Location: [facility] ENDOSCOPY;  Service: Endoscopy;  Laterality: N/A;  . Enteroscopy N/A 11/08/2012    Procedure: ENTEROSCOPY;  Surgeon: Wonda Horner, MD;  Location: [facility] ENDOSCOPY;  Service: Endoscopy;  Laterality: N/A;  . Amputation Left 05/12/2013    Procedure: AMPUTATION RAY;  Surgeon:  Newt Minion, MD;  Location: [place];  Service: Orthopedics;  Laterality: Left;  Left Foot 1st Ray Amputation  . Eye surgery Left     to remove scar tissue  . Amputation Left 06/09/2013    Procedure: AMPUTATION BELOW KNEE;  Surgeon: Newt Minion, MD;  Location: [place];  Service: Orthopedics;  Laterality: Left;  Left Below Knee Amputation and removal proximal screws IM tibial nail  . Hardware removal Left 06/09/2013    Procedure: HARDWARE REMOVAL;  Surgeon: Newt Minion,  MD;  Location: [place];  Service: Orthopedics;  Laterality: Left;  Left Below Knee Amputation  and Removal proximal screws IM tibial nail  . Amputation Right 09/08/2013    Procedure: AMPUTATION BELOW KNEE;  Surgeon: Newt Minion, MD;  Location: [place];  Service: Orthopedics;  Laterality: Right;  Right Below Knee Amputation  . Amputation Right 10/11/2013    Procedure: AMPUTATION BELOW KNEE;  Surgeon: Newt Minion, MD;  Location: [place];  Service: Orthopedics;  Laterality: Right;  Right Below Knee Amputation Revision    reports that he has never smoked. He has never used smokeless tobacco. He reports that he does not drink alcohol or use illicit drugs. family history includes Coronary artery disease in his other; Diabetes in his mother. No Known Allergies Current Outpatient Prescriptions on File Prior to Visit  Medication Sig Dispense Refill  . acetaminophen (TYLENOL) 500 MG tablet Take 500 mg by mouth every 6 (six) hours as needed for mild pain.    [name] amLODipine (NORVASC) 10 MG tablet TAKE 1 TABLET BY MOUTH DAILY 90 tablet 0  . calcium acetate (PHOSLO) 667 MG capsule Take 2 capsules (1,334 mg total) by mouth 3 (three) times daily with meals. 180 capsule 0  . clopidogrel (PLAVIX) 75 MG tablet TAKE 1 TABLET BY MOUTH DAILY 90 tablet 0  . Elbasvir-Grazoprevir (ZEPATIER) 50-100 MG TABS Take 1 tablet by mouth daily. 28 tablet 2  . hydrALAZINE (APRESOLINE) 100 MG tablet Take 1 tablet (100 mg total) by mouth every 8 (eight) hours. 90 tablet 11  . labetalol (NORMODYNE) 300 MG tablet Take 1 tablet (300 mg total) by mouth 3 (three) times daily. 90 tablet 11  . latanoprost (XALATAN) 0.005 % ophthalmic solution place 1 drop into right eye at bedtime  0  . lidocaine-prilocaine (EMLA) cream APPLY A SMALL AMOUNT TO SKIN AT THE ACCESS SITE (AVF) AS DIRECTED BEFORE EACH DIALYSIS SESSION THREE DAYS A WEEK  6  . PREVIDENT 5000 PLUS 1.1 % CREA dental cream See admin instructions.  6  . SENSIPAR 30 MG tablet Take 1 tablet  by mouth every evening.    . silver sulfADIAZINE (SILVADENE) 1 % cream Apply 1 application topically daily.    . VOLTAREN 1 % GEL APPLY 2 GRAMS TO HANDS BID TO TID  0   No current facility-administered medications on file prior to visit.   Review of Systems  Constitutional: Negative for unusual diaphoresis or night sweats HENT: Negative for ear swelling or discharge Eyes: Negative for worsening visual haziness  Respiratory: Negative for choking and stridor.   Gastrointestinal: Negative for distension or worsening eructation Genitourinary: Negative for retention or change in urine volume.  Musculoskeletal: Negative for other MSK pain or swelling Skin: Negative for color change and worsening wound Neurological: Negative for tremors and numbness other than noted  Psychiatric/Behavioral: Negative for decreased concentration or agitation other than above       Objective:   Physical Exam BP 128/70 mmHg  Pulse 85  Temp(Src) 98.6 F (37 C) (Oral)  Resp 20  Wt 210 lb (95.255 kg)  SpO2 99% VS noted, not ill appearing Constitutional: Pt appears in no apparent distress HENT: Head: NCAT.  Right Ear: External ear normal.  Left Ear: External ear normal.  Eyes: . Pupils are equal, round, and reactive to light. Conjunctivae and EOM are normal Neck: Normal range of motion. Neck supple.  Cardiovascular: Normal rate and regular rhythm.   Pulmonary/Chest: Effort normal and breath sounds without rales or wheezing.  Neurological: Pt is alert. Not confused , motor grossly intact Skin: Skin is warm. Tinea type rash noted to upper back and arms, no LE edema Psychiatric: Pt behavior is normal. No agitation.     Assessment & Plan:

## 2015-11-13 NOTE — Patient Instructions (Signed)
Please take all new medication as prescribed - the antifungal cream to the itchy areas  You may want to ask your Dialysis physician about whether some nitroglycerin type cream would be helpful for the hand cramping after dialysis  Please continue all other medications as before, and refills have been done if requested.  Please have the pharmacy call with any other refills you may need.  Please keep your appointments with your specialists as you may have planned

## 2015-11-13 NOTE — Progress Notes (Signed)
Pre visit review using our clinic review tool, if applicable. No additional management support is needed unless otherwise documented below in the visit note. 

## 2015-11-14 NOTE — Therapy (Signed)
[facility] [address] [place], [place], [address] Phone: [phone]   Fax:  [phone]  Physical Therapy Treatment  Patient Details  Name: Oscar Castillo MRN: [id number] Date of Birth: 1951-06-08 Referring Provider: [name], MD  Encounter Date: 11/13/2015      PT End of Session - 11/13/15 1330    Visit Number 39   Number of Visits 40   Date for PT Re-Evaluation 11/15/15   Authorization Type Medicare Do G-Code & progress reports every 10 visits   PT Start Time 6712   PT Stop Time 1304   PT Time Calculation (min) 29 min   Equipment Utilized During Treatment Gait belt   Activity Tolerance Patient tolerated treatment well;Patient limited by pain   Behavior During Therapy [facility] for tasks assessed/performed  b/c he had to rush to/from prosthesis appt.      Past Medical History  Diagnosis Date  . ESRD on hemodialysis ([place]) 05/05/2007    ESRD due to DM/HTN. Started dialysis in November 2013.  HD TTS at [facility] on [address].  [name] BACK PAIN, LUMBAR, CHRONIC 08/06/2009  . BENIGN PROSTATIC HYPERTROPHY 08/01/2010  . CEREBROVASCULAR ACCIDENT, HX OF 08/06/2009  . CHOLELITHIASIS 08/01/2010  . CONGESTIVE HEART FAILURE 03/18/2009  . DEPRESSION 03/18/2009  . DIABETES MELLITUS, TYPE II 03/25/2007  . ERECTILE DYSFUNCTION 03/25/2007  . GERD 03/25/2007  . HEPATITIS C, HX OF 03/25/2007  . HYPERTENSION 03/25/2007  . Morbid obesity ([place]) 03/25/2007  . NEPHROLITHIASIS, HX OF 03/18/2009  . Complication of anesthesia     wife states pt had trouble waking up with his last surgery in Nov., 2014  . History of Clostridium difficile   . Hemorrhoids   . Anemia   . Antral ulcer 2014    small    Past Surgical History  Procedure Laterality Date  . Nephrectomy      partial RR  . Av fistula placement  06/14/2012    Procedure: ARTERIOVENOUS (AV) FISTULA CREATION;  Surgeon: Angelia Mould, MD;  Location: [facility] OR;  Service: Vascular;   Laterality: Left;  Left basilic vein transposition with fistula.  . Tibia im nail insertion Left 09/09/2012    Procedure: INTRAMEDULLARY (IM) NAIL TIBIAL;  Surgeon: Johnny Bridge, MD;  Location: [place] [facility];  Service: Orthopedics;  Laterality: Left;  left tibial nail and open reduction internal fixation left fibula fracture  . Orif fibula fracture Left 09/09/2012    Procedure: OPEN REDUCTION INTERNAL FIXATION (ORIF) FIBULA FRACTURE;  Surgeon: Johnny Bridge, MD;  Location: [facility];  Service: Orthopedics;  Laterality: Left;  . Colonoscopy N/A 10/28/2012    Procedure: COLONOSCOPY;  Surgeon: Jeryl Columbia, MD;  Location: [facility] ENDOSCOPY;  Service: Endoscopy;  Laterality: N/A;  . Esophagogastroduodenoscopy N/A 11/02/2012    Procedure: ESOPHAGOGASTRODUODENOSCOPY (EGD);  Surgeon: Cleotis Nipper, MD;  Location: [facility] ENDOSCOPY;  Service: Endoscopy;  Laterality: N/A;  . Colonoscopy N/A 11/02/2012    Procedure: COLONOSCOPY;  Surgeon: Cleotis Nipper, MD;  Location: [facility] ENDOSCOPY;  Service: Endoscopy;  Laterality: N/A;  . Colonoscopy N/A 11/03/2012    Procedure: COLONOSCOPY;  Surgeon: Cleotis Nipper, MD;  Location: [facility] ENDOSCOPY;  Service: Endoscopy;  Laterality: N/A;  . Givens capsule study N/A 11/04/2012    Procedure: GIVENS CAPSULE STUDY;  Surgeon: Cleotis Nipper, MD;  Location: [facility] ENDOSCOPY;  Service: Endoscopy;  Laterality: N/A;  . Enteroscopy N/A 11/08/2012    Procedure: ENTEROSCOPY;  Surgeon: Wonda Horner, MD;  Location: [facility]  ENDOSCOPY;  Service: Endoscopy;  Laterality: N/A;  . Amputation Left 05/12/2013    Procedure: AMPUTATION RAY;  Surgeon: Newt Minion, MD;  Location: [place];  Service: Orthopedics;  Laterality: Left;  Left Foot 1st Ray Amputation  . Eye surgery Left     to remove scar tissue  . Amputation Left 06/09/2013    Procedure: AMPUTATION BELOW KNEE;  Surgeon: Newt Minion, MD;  Location: [place];  Service: Orthopedics;  Laterality: Left;  Left Below Knee Amputation and removal proximal screws IM tibial  nail  . Hardware removal Left 06/09/2013    Procedure: HARDWARE REMOVAL;  Surgeon: Newt Minion, MD;  Location: [place];  Service: Orthopedics;  Laterality: Left;  Left Below Knee Amputation  and Removal proximal screws IM tibial nail  . Amputation Right 09/08/2013    Procedure: AMPUTATION BELOW KNEE;  Surgeon: Newt Minion, MD;  Location: [place];  Service: Orthopedics;  Laterality: Right;  Right Below Knee Amputation  . Amputation Right 10/11/2013    Procedure: AMPUTATION BELOW KNEE;  Surgeon: Newt Minion, MD;  Location: [place];  Service: Orthopedics;  Laterality: Right;  Right Below Knee Amputation Revision    There were no vitals filed for this visit.      Subjective Assessment - 11/13/15 1235    Subjective Walked with wife   Patient is accompained by: Family member   Limitations Lifting;Walking;Standing   Patient Stated Goals To be able to walk more & hopefully by himself with prostheses   Currently in Pain? Yes   Pain Score 2    Pain Location Finger (Comment which one)   Pain Orientation Left;Right   Pain Descriptors / Indicators Sore   Pain Type Chronic pain   Pain Onset More than a month ago   Pain Frequency Constant   Aggravating Factors  dialysis   Pain Relieving Factors cream            [facility] PT Assessment - 11/13/15 1304    Observation/Other Assessments   Focus on Therapeutic Outcomes (FOTO)  33.22 Functional Status   Fear Avoidance Belief Questionnaire (FABQ)  57%         Prosthetics Assessment - 11/13/15 1306    Prosthetics   Prosthetic Care Independent with Skin check;Residual limb care;Prosthetic cleaning;Correct ply sock adjustment;Proper wear schedule/adjustment;Other (comment)  with wife's assist   Current prosthetic wear tolerance (days/week)  7 days   Current prosthetic wear tolerance (#hours/day)  90% of awake hours, including dialysis   Residual limb condition  no open areas, normal color & moisture                    [facility] Adult PT  Treatment/Exercise - 11/13/15 1304    Transfers   Transfers Sit to Stand;Stand to [employer] Transfers   Sit to Stand 4: Min assist;With upper extremity assist;With armrests;From chair/3-in-1  to RW with wife's assist   Stand to Sit 4: Min assist;With upper extremity assist;With armrests;To chair/3-in-1  from [place] wife's assist   Stand Pivot Transfers 4: Min assist  RW with wife's assist   Stand Pivot Transfers: Patient Percentage 90%   Ambulation/Gait   Ambulation/Gait Yes   Ambulation/Gait Assistance 4: Min assist  from wife safely   Ambulation Distance (Feet) 12 Feet  12' X 2 including 90* turn to position to sit   Assistive device Prostheses;Rolling walker   Gait Pattern Decreased step length - right;Decreased step length - left;Decreased stride length;Right flexed knee in stance;Left  flexed knee in stance;Trunk flexed;Narrow base of support   Ambulation Surface Indoor;Level                PT Education - 11/13/15 1307    Education provided Yes   Education Details ongoing HEP / fitness including [employer], gait, transfers, balance at counter with wife's assist, increasing frequency of gait with walking for "purpose" and distance only when ambulates shorter distance for 1 week with no fatigue or other issues.    Person(s) Educated Patient;Spouse   Methods Explanation;Verbal cues   Comprehension Verbalized understanding          PT Short Term Goals - 10/16/15 2209    PT SHORT TERM GOAL #1   Title Patient sit to /from stand w/c to sink with moderate assist. (NEW Target Date: 08/30/2015)    Baseline MET 08/28/2015 to RW   Status Achieved   PT SHORT TERM GOAL #2   Title Patient & wife demonstrate / verbalize HEP / updated exercise program for fitness center. (Target Date: 07/26/2015)   Baseline 07/24/15: doing HEP on non dialysis days, seated ones every day and [facility] on [address] per family.    Status Achieved   PT SHORT TERM GOAL #3   Title Patient  ambulates 94' with RW & prostheses with moderate assist (2 people for safety) (Target Date: 07/26/2015)   Baseline 07/24/15: pt able to ambulate short distance with mod assist of 2 people, did not reach 40 feet goal   Status Partially Met   PT SHORT TERM GOAL #4   Title Patient tolerates 30 seconds of standing with RW with minA. (Target Date: 08/30/2015)   Baseline MET 08/28/2015    Time 1   Period Months   Status Achieved   PT SHORT TERM GOAL #5   Title Patient ambulates 1' with RW / prostheses & turns 90* to sit in chair with armrests with moderate assist (can have 2nd person for safety). Target Date: 08/30/2015   Baseline Partially MET 08/28/2015 Patient ambulates 15' with RW /prostheses & turns 90* to sit in chair with armrests with moderate assist (2nd person required for assist)   Time 1   Period Months   Status Partially Met   PT SHORT TERM GOAL #6   Title Patient verbalizes standing & HEP compliance >/= 4 days/wk. (Target Date: 10/16/2015)   Baseline Partially MET 10/16/2015 patient verbalizes compliance but limits stand reps due UE pain.   Time 1   Period Months   Status Achieved   PT SHORT TERM GOAL #7   Title Patient ambulates 4' with RW & prostheses with wife's assist with PT supervising.  (Target Date: 10/16/2015)   Baseline NOT MET 10/16/2015 Patient ambulates 20' with minA from PT on straight path only and modA on turns.   Time 1   Period Months   Status Not Met   PT SHORT TERM GOAL #8   Title Patient ambulates 9' with RW & prostheses with minA.  (Target Date: 10/16/2015)   Baseline NOT MET patient ambulates up to 20' before fatigues.   Time 1   Period Months   Status Not Met           PT Long Term Goals - 11/13/15 1238    PT LONG TERM GOAL #1   Title Patient and wife demonstrate proper prosthetic care. (Target Date: 09/20/2015)   Baseline MET 09/16/2015   Time 3   Period Months   Status Achieved   PT LONG  TERM GOAL #2   Title Pt & wife verbalize & demonstrate  ongoing HEP / fitness plan.  (Target Date: 09/20/2015)  NEW Target DATE 11/15/2015   Baseline MET 11-23-15   Time 3   Period Months   Status Achieved   PT LONG TERM GOAL #3   Title stand pivot transfers with rolling walker & prostheses with minA from wife.  (Target Date: 09/20/2015) NEW Target DATE 11/15/2015   Baseline MET 2015/11/23   Time 3   Period Months   Status Achieved   PT LONG TERM GOAL #4   Title adjusts clothes & reaches 2" anteriorly while standing with RW support with minimal assist.  (Target Date: 09/20/2015)  NEW Target DATE 11/15/2015   Baseline MET 11-23-2015   Time 3   Period Months   Status Achieved   PT LONG TERM GOAL #5   Title ambulate 10' including turning to sit with rolling walker & prostheses with minimal assist from wife safely.  (Target Date: 09/20/2015)  NEW Target DATE 11/15/2015   Baseline MET 2015-11-23   Time 3   Period Months   Status Achieved               Plan - 11-23-2015 1300    Clinical Impression Statement Patient met all LTGs. He appears safe to work on gait with wife at home. Both patient & wife seem to understand ongoin fitness plan / HEP including gait, transfers & standing balance.    Rehab Potential Good   PT Frequency 2x / week   PT Duration Other (comment)  9 weeks (60 days)   PT Treatment/Interventions ADLs/Self Care Home Management;DME Instruction;Gait training;Stair training;Functional mobility training;Therapeutic activities;Therapeutic exercise;Balance training;Neuromuscular re-education;Patient/family education;Prosthetic Training   PT Next Visit Plan discharge   Consulted and Agree with Plan of Care Patient;Family member/caregiver   Family Member Consulted wife      Patient will benefit from skilled therapeutic intervention in order to improve the following deficits and impairments:  Abnormal gait, Decreased activity tolerance, Decreased balance, Decreased endurance, Decreased mobility, Decreased range of motion, Decreased  strength, Postural dysfunction, Prosthetic Dependency  Visit Diagnosis: Muscle weakness (generalized)  Other abnormalities of gait and mobility  Generalized weakness       G-Codes - 2015-11-23 1301    Functional Assessment Tool Used MinA stand-pivot transfer with wife's assist. Reaches 2" with RW with minA. Ambulates 22' with RW with wife's assist including turning 90* to position to sit.    Functional Limitation Changing and maintaining body position   Changing and Maintaining Body Position Goal Status [phone]) At least 40 percent but less than 60 percent impaired, limited or restricted   Changing and Maintaining Body Position Discharge Status ([id number]) At least 40 percent but less than 60 percent impaired, limited or restricted      Problem List Patient Active Problem List   Diagnosis Date Noted  . Liver fibrosis ([place]) 10/07/2015  . Diarrhea 08/14/2014  . Dehydration 10/02/2013  . Fever 10/02/2013  . Altered mental status 10/01/2013  . Encephalopathy, toxic 10/01/2013  . Encephalopathy, metabolic 03/83/3383  . CVA (cerebral vascular accident) ([place]) 09/28/2013  . FTT (failure to thrive) in adult 09/28/2013  . Acute confusional state 09/28/2013  . Ulcer of sacral region, stage 3 ([place]) 09/26/2013  . S/P BKA (below knee amputation) bilateral ([place]) 09/08/2013  . ESRD on hemodialysis ([place]) 05/09/2013  . TIA (transient ischemic attack) 02/20/2013  . Acute blood loss anemia 10/28/2012  . Chronic combined systolic (EF 29%) and grade  2diastolic congestive heart failure 10/28/2012  . Obstipation 10/28/2012  . GI bleed 10/27/2012  . Mass in rectum 10/27/2012  . DM (diabetes mellitus) type I controlled with renal manifestation ([place]) 09/08/2012  . LVH (left ventricular hypertrophy)-severe concentric 06/13/2012  . Anemia due to chronic illness 06/12/2012  . Hyperlipidemia 01/30/2011  . CHOLELITHIASIS 08/01/2010  . BENIGN PROSTATIC HYPERTROPHY 08/01/2010  . CEREBROVASCULAR ACCIDENT, HX  OF 08/06/2009  . Depression 03/18/2009  . Acute combined systolic and diastolic heart failure, NYHA class 2-EF 45% 03/18/2009  . NEPHROLITHIASIS, HX OF 03/18/2009  . Morbid obesity ([place]) 03/25/2007  . Essential hypertension 03/25/2007  . GERD 03/25/2007  . Chronic hepatitis C without hepatic coma ([place]) 03/25/2007    PHYSICAL THERAPY DISCHARGE SUMMARY  Visits from Start of Care: 39  Current functional level related to goals / functional outcomes: See above   Remaining deficits: See above   Education / Equipment: HEP & how to safely assist gait & transfers Plan: Patient agrees to discharge.  Patient goals were met. Patient is being discharged due to meeting the stated rehab goals.  ?????        , PT, DPT 11/14/2015, 1:11 PM  [facility] [address] [place], [place], [address] Phone: [phone]   Fax:  [phone]  Name: Oscar Castillo MRN: [id number] Date of Birth: April 06, 1951

## 2015-11-15 DIAGNOSIS — R21 Rash and other nonspecific skin eruption: Secondary | ICD-10-CM | POA: Insufficient documentation

## 2015-11-15 DIAGNOSIS — R252 Cramp and spasm: Secondary | ICD-10-CM | POA: Insufficient documentation

## 2015-11-15 NOTE — Assessment & Plan Note (Signed)
stable overall by history and exam, recent data reviewed with pt, and pt to continue medical treatment as before,  to f/u any worsening symptoms or concerns BP Readings from Last 3 Encounters:  11/13/15 128/70  10/07/15 139/82  07/10/15 106/74

## 2015-11-15 NOTE — Assessment & Plan Note (Signed)
Tinea type by exam, ok for ketoconozole cr asd,  to f/u any worsening symptoms or concerns

## 2015-11-15 NOTE — Assessment & Plan Note (Signed)
I suspect related to post dialysis fluid shifts, to consider topical NTG to hand post dialysis, pt to f/u with renal,  to f/u any worsening symptoms or concerns

## 2015-11-16 DIAGNOSIS — E1122 Type 2 diabetes mellitus with diabetic chronic kidney disease: Secondary | ICD-10-CM | POA: Diagnosis not present

## 2015-11-16 DIAGNOSIS — N186 End stage renal disease: Secondary | ICD-10-CM | POA: Diagnosis not present

## 2015-11-16 DIAGNOSIS — N2581 Secondary hyperparathyroidism of renal origin: Secondary | ICD-10-CM | POA: Diagnosis not present

## 2015-11-18 ENCOUNTER — Telehealth: Payer: Self-pay | Admitting: *Deleted

## 2015-11-18 DIAGNOSIS — R21 Rash and other nonspecific skin eruption: Secondary | ICD-10-CM

## 2015-11-18 MED ORDER — HYDROXYZINE HCL 25 MG PO TABS: 25.0000 mg | ORAL_TABLET | Freq: Four times a day (QID) | ORAL | Status: DC | PRN

## 2015-11-18 NOTE — Telephone Encounter (Signed)
At this point, best thing would be itching medication (pill) and refer dermatology if the cream did not help  I will do, thanks

## 2015-11-18 NOTE — Telephone Encounter (Signed)
Left msgon triage stating pt saw MD last week for severe rash, and he was going to rx a pill form to help with itching, but husband said he would like the cream instead. Well the cream is not helping at all pt is wanting MD to go ahead and rx pill...Johny Chess

## 2015-11-19 DIAGNOSIS — E1122 Type 2 diabetes mellitus with diabetic chronic kidney disease: Secondary | ICD-10-CM | POA: Diagnosis not present

## 2015-11-19 DIAGNOSIS — N2581 Secondary hyperparathyroidism of renal origin: Secondary | ICD-10-CM | POA: Diagnosis not present

## 2015-11-19 DIAGNOSIS — N186 End stage renal disease: Secondary | ICD-10-CM | POA: Diagnosis not present

## 2015-11-19 NOTE — Telephone Encounter (Signed)
Notified pt wife w/md Response.[name]/LMB

## 2015-11-21 ENCOUNTER — Encounter: Payer: Self-pay | Admitting: Internal Medicine

## 2015-11-21 DIAGNOSIS — E1122 Type 2 diabetes mellitus with diabetic chronic kidney disease: Secondary | ICD-10-CM | POA: Diagnosis not present

## 2015-11-21 DIAGNOSIS — N186 End stage renal disease: Secondary | ICD-10-CM | POA: Diagnosis not present

## 2015-11-21 DIAGNOSIS — N2581 Secondary hyperparathyroidism of renal origin: Secondary | ICD-10-CM | POA: Diagnosis not present

## 2015-11-23 DIAGNOSIS — N186 End stage renal disease: Secondary | ICD-10-CM | POA: Diagnosis not present

## 2015-11-23 DIAGNOSIS — N2581 Secondary hyperparathyroidism of renal origin: Secondary | ICD-10-CM | POA: Diagnosis not present

## 2015-11-23 DIAGNOSIS — E1122 Type 2 diabetes mellitus with diabetic chronic kidney disease: Secondary | ICD-10-CM | POA: Diagnosis not present

## 2015-11-24 DIAGNOSIS — E1122 Type 2 diabetes mellitus with diabetic chronic kidney disease: Secondary | ICD-10-CM | POA: Diagnosis not present

## 2015-11-24 DIAGNOSIS — Z992 Dependence on renal dialysis: Secondary | ICD-10-CM | POA: Diagnosis not present

## 2015-11-24 DIAGNOSIS — N186 End stage renal disease: Secondary | ICD-10-CM | POA: Diagnosis not present

## 2015-11-26 DIAGNOSIS — D631 Anemia in chronic kidney disease: Secondary | ICD-10-CM | POA: Diagnosis not present

## 2015-11-26 DIAGNOSIS — D509 Iron deficiency anemia, unspecified: Secondary | ICD-10-CM | POA: Diagnosis not present

## 2015-11-26 DIAGNOSIS — N2581 Secondary hyperparathyroidism of renal origin: Secondary | ICD-10-CM | POA: Diagnosis not present

## 2015-11-26 DIAGNOSIS — N186 End stage renal disease: Secondary | ICD-10-CM | POA: Diagnosis not present

## 2015-11-26 DIAGNOSIS — E1122 Type 2 diabetes mellitus with diabetic chronic kidney disease: Secondary | ICD-10-CM | POA: Diagnosis not present

## 2015-11-28 ENCOUNTER — Telehealth: Payer: Self-pay | Admitting: Internal Medicine

## 2015-11-28 DIAGNOSIS — D631 Anemia in chronic kidney disease: Secondary | ICD-10-CM | POA: Diagnosis not present

## 2015-11-28 DIAGNOSIS — E1122 Type 2 diabetes mellitus with diabetic chronic kidney disease: Secondary | ICD-10-CM | POA: Diagnosis not present

## 2015-11-28 DIAGNOSIS — N2581 Secondary hyperparathyroidism of renal origin: Secondary | ICD-10-CM | POA: Diagnosis not present

## 2015-11-28 DIAGNOSIS — D509 Iron deficiency anemia, unspecified: Secondary | ICD-10-CM | POA: Diagnosis not present

## 2015-11-28 DIAGNOSIS — N186 End stage renal disease: Secondary | ICD-10-CM | POA: Diagnosis not present

## 2015-11-28 NOTE — Telephone Encounter (Signed)
Pt called stated someone call want to know the prognoses of his eye exam for dialasys. Please help, not sure if we call or the recording is going out again. Please help call pt back

## 2015-11-30 DIAGNOSIS — D631 Anemia in chronic kidney disease: Secondary | ICD-10-CM | POA: Diagnosis not present

## 2015-11-30 DIAGNOSIS — N186 End stage renal disease: Secondary | ICD-10-CM | POA: Diagnosis not present

## 2015-11-30 DIAGNOSIS — N2581 Secondary hyperparathyroidism of renal origin: Secondary | ICD-10-CM | POA: Diagnosis not present

## 2015-11-30 DIAGNOSIS — E1122 Type 2 diabetes mellitus with diabetic chronic kidney disease: Secondary | ICD-10-CM | POA: Diagnosis not present

## 2015-11-30 DIAGNOSIS — D509 Iron deficiency anemia, unspecified: Secondary | ICD-10-CM | POA: Diagnosis not present

## 2015-12-03 DIAGNOSIS — N186 End stage renal disease: Secondary | ICD-10-CM | POA: Diagnosis not present

## 2015-12-03 DIAGNOSIS — D509 Iron deficiency anemia, unspecified: Secondary | ICD-10-CM | POA: Diagnosis not present

## 2015-12-03 DIAGNOSIS — E1122 Type 2 diabetes mellitus with diabetic chronic kidney disease: Secondary | ICD-10-CM | POA: Diagnosis not present

## 2015-12-03 DIAGNOSIS — D631 Anemia in chronic kidney disease: Secondary | ICD-10-CM | POA: Diagnosis not present

## 2015-12-03 DIAGNOSIS — L309 Dermatitis, unspecified: Secondary | ICD-10-CM | POA: Diagnosis not present

## 2015-12-03 DIAGNOSIS — N2581 Secondary hyperparathyroidism of renal origin: Secondary | ICD-10-CM | POA: Diagnosis not present

## 2015-12-05 DIAGNOSIS — E1122 Type 2 diabetes mellitus with diabetic chronic kidney disease: Secondary | ICD-10-CM | POA: Diagnosis not present

## 2015-12-05 DIAGNOSIS — N186 End stage renal disease: Secondary | ICD-10-CM | POA: Diagnosis not present

## 2015-12-05 DIAGNOSIS — D631 Anemia in chronic kidney disease: Secondary | ICD-10-CM | POA: Diagnosis not present

## 2015-12-05 DIAGNOSIS — N2581 Secondary hyperparathyroidism of renal origin: Secondary | ICD-10-CM | POA: Diagnosis not present

## 2015-12-05 DIAGNOSIS — D509 Iron deficiency anemia, unspecified: Secondary | ICD-10-CM | POA: Diagnosis not present

## 2015-12-07 DIAGNOSIS — D509 Iron deficiency anemia, unspecified: Secondary | ICD-10-CM | POA: Diagnosis not present

## 2015-12-07 DIAGNOSIS — D631 Anemia in chronic kidney disease: Secondary | ICD-10-CM | POA: Diagnosis not present

## 2015-12-07 DIAGNOSIS — N2581 Secondary hyperparathyroidism of renal origin: Secondary | ICD-10-CM | POA: Diagnosis not present

## 2015-12-07 DIAGNOSIS — E1122 Type 2 diabetes mellitus with diabetic chronic kidney disease: Secondary | ICD-10-CM | POA: Diagnosis not present

## 2015-12-07 DIAGNOSIS — N186 End stage renal disease: Secondary | ICD-10-CM | POA: Diagnosis not present

## 2015-12-09 ENCOUNTER — Ambulatory Visit (INDEPENDENT_AMBULATORY_CARE_PROVIDER_SITE_OTHER): Payer: Medicare Other | Admitting: Internal Medicine

## 2015-12-09 ENCOUNTER — Encounter: Payer: Self-pay | Admitting: Internal Medicine

## 2015-12-09 VITALS — BP 138/83 | HR 81 | Temp 98.0°F

## 2015-12-09 DIAGNOSIS — H2511 Age-related nuclear cataract, right eye: Secondary | ICD-10-CM | POA: Diagnosis not present

## 2015-12-09 DIAGNOSIS — E113599 Type 2 diabetes mellitus with proliferative diabetic retinopathy without macular edema, unspecified eye: Secondary | ICD-10-CM | POA: Diagnosis not present

## 2015-12-09 DIAGNOSIS — K7400 Hepatic fibrosis, unspecified: Secondary | ICD-10-CM

## 2015-12-09 DIAGNOSIS — B182 Chronic viral hepatitis C: Secondary | ICD-10-CM | POA: Diagnosis not present

## 2015-12-09 DIAGNOSIS — N186 End stage renal disease: Secondary | ICD-10-CM | POA: Diagnosis not present

## 2015-12-09 DIAGNOSIS — H25011 Cortical age-related cataract, right eye: Secondary | ICD-10-CM | POA: Diagnosis not present

## 2015-12-09 DIAGNOSIS — I871 Compression of vein: Secondary | ICD-10-CM | POA: Diagnosis not present

## 2015-12-09 DIAGNOSIS — Z992 Dependence on renal dialysis: Secondary | ICD-10-CM | POA: Diagnosis not present

## 2015-12-09 DIAGNOSIS — T82868D Thrombosis of vascular prosthetic devices, implants and grafts, subsequent encounter: Secondary | ICD-10-CM | POA: Diagnosis not present

## 2015-12-09 DIAGNOSIS — Z961 Presence of intraocular lens: Secondary | ICD-10-CM | POA: Diagnosis not present

## 2015-12-09 NOTE — Assessment & Plan Note (Signed)
Will repeat elastography next year.

## 2015-12-09 NOTE — Assessment & Plan Note (Signed)
End of treatment lab today and rtc 3 months for SVR 12.

## 2015-12-09 NOTE — Progress Notes (Signed)
   Subjective:    Patient ID: Oscar Castillo, male    DOB: 12/05/1950, 65 y.o.   MRN: [id number]  HPI Here for follow up of hepatitis C.  Has started elbasvir/grazoprevir and now completed his course 2 weeks ago.  No issues with headache or fatigue.  No complaints.  Genotype 1b, viral load of 335,000, then 16 copies on treatment.  End of treatment lab today.  Asking about his chances of getting a transplant now.    Review of Systems  Constitutional: Negative for fatigue.  Gastrointestinal: Negative for nausea.  Skin: Negative for rash.  Neurological: Negative for dizziness and headaches.       Objective:   Physical Exam  Constitutional: He appears well-developed and well-nourished. No distress.  Eyes: No scleral icterus.  Cardiovascular: Normal rate, regular rhythm and normal heart sounds.   No murmur heard. Skin: No rash noted.          Assessment & Plan:

## 2015-12-10 DIAGNOSIS — D509 Iron deficiency anemia, unspecified: Secondary | ICD-10-CM | POA: Diagnosis not present

## 2015-12-10 DIAGNOSIS — N2581 Secondary hyperparathyroidism of renal origin: Secondary | ICD-10-CM | POA: Diagnosis not present

## 2015-12-10 DIAGNOSIS — N186 End stage renal disease: Secondary | ICD-10-CM | POA: Diagnosis not present

## 2015-12-10 DIAGNOSIS — D631 Anemia in chronic kidney disease: Secondary | ICD-10-CM | POA: Diagnosis not present

## 2015-12-10 DIAGNOSIS — E1122 Type 2 diabetes mellitus with diabetic chronic kidney disease: Secondary | ICD-10-CM | POA: Diagnosis not present

## 2015-12-10 LAB — HEPATITIS C RNA QUANTITATIVE: HCV Quantitative: NOT DETECTED IU/mL (ref <15)

## 2015-12-11 ENCOUNTER — Other Ambulatory Visit (INDEPENDENT_AMBULATORY_CARE_PROVIDER_SITE_OTHER): Payer: Medicare Other

## 2015-12-11 ENCOUNTER — Ambulatory Visit (INDEPENDENT_AMBULATORY_CARE_PROVIDER_SITE_OTHER): Payer: Medicare Other | Admitting: Internal Medicine

## 2015-12-11 VITALS — BP 130/70 | HR 82 | Temp 98.4°F | Resp 20 | Wt 197.0 lb

## 2015-12-11 DIAGNOSIS — I10 Essential (primary) hypertension: Secondary | ICD-10-CM | POA: Diagnosis not present

## 2015-12-11 DIAGNOSIS — E1022 Type 1 diabetes mellitus with diabetic chronic kidney disease: Secondary | ICD-10-CM

## 2015-12-11 DIAGNOSIS — R197 Diarrhea, unspecified: Secondary | ICD-10-CM | POA: Diagnosis not present

## 2015-12-11 DIAGNOSIS — N184 Chronic kidney disease, stage 4 (severe): Secondary | ICD-10-CM

## 2015-12-11 DIAGNOSIS — E785 Hyperlipidemia, unspecified: Secondary | ICD-10-CM | POA: Diagnosis not present

## 2015-12-11 LAB — LIPID PANEL
Cholesterol: 158 mg/dL (ref 0–200)
HDL: 29.7 mg/dL — ABNORMAL LOW (ref >39.00)
LDL CALC: 111 mg/dL — AB (ref 0–99)
NONHDL: 128.75
Total CHOL/HDL Ratio: 5
Triglycerides: 90 mg/dL (ref 0.0–149.0)
VLDL: 18 mg/dL (ref 0.0–40.0)

## 2015-12-11 LAB — HEMOGLOBIN A1C: HEMOGLOBIN A1C: 6.2 % (ref 4.6–6.5)

## 2015-12-11 NOTE — Progress Notes (Signed)
Pre visit review using our clinic review tool, if applicable. No additional management support is needed unless otherwise documented below in the visit note. 

## 2015-12-11 NOTE — Progress Notes (Signed)
Subjective:    Patient ID: Oscar Castillo, male    DOB: 1950/12/18, 65 y.o.   MRN: [id number]  HPI    Here to f/u, with c/o watery nonbloody Diarrhea since last thur 4 days, without fever, abd pain, n/v; now improving with only 1 BM this am. No recent antibx ,has hx of c diff.  Denies worsening reflux, abd pain, dysphagia.   Pt denies fever, wt loss, night sweats, loss of appetite, or other constitutional symptoms  Pt denies chest pain, increased sob or doe, wheezing, orthopnea, PND, increased LE swelling, palpitations, dizziness or syncope.  Recent rash resolved , after oral medication after cream failure for fungal rash, seen per Dr Allyson Sabal without further tx, except for otc antifungal cr. Also with Dialysis HD - tues/thur/sat.   Pt denies polydipsia, polyuria  Past Medical History  Diagnosis Date  . ESRD on hemodialysis ([place]) 05/05/2007    ESRD due to DM/HTN. Started dialysis in November 2013.  HD TTS at [facility] on [address].  [name] BACK PAIN, LUMBAR, CHRONIC 08/06/2009  . BENIGN PROSTATIC HYPERTROPHY 08/01/2010  . CEREBROVASCULAR ACCIDENT, HX OF 08/06/2009  . CHOLELITHIASIS 08/01/2010  . CONGESTIVE HEART FAILURE 03/18/2009  . DEPRESSION 03/18/2009  . DIABETES MELLITUS, TYPE II 03/25/2007  . ERECTILE DYSFUNCTION 03/25/2007  . GERD 03/25/2007  . HEPATITIS C, HX OF 03/25/2007  . HYPERTENSION 03/25/2007  . Morbid obesity ([place]) 03/25/2007  . NEPHROLITHIASIS, HX OF 03/18/2009  . Complication of anesthesia     wife states pt had trouble waking up with his last surgery in Nov., 2014  . History of Clostridium difficile   . Hemorrhoids   . Anemia   . Antral ulcer 2014    small   Past Surgical History  Procedure Laterality Date  . Nephrectomy      partial RR  . Av fistula placement  06/14/2012    Procedure: ARTERIOVENOUS (AV) FISTULA CREATION;  Surgeon: Angelia Mould, MD;  Location: [facility] OR;  Service: Vascular;  Laterality: Left;  Left basilic vein transposition with fistula.  . Tibia im nail  insertion Left 09/09/2012    Procedure: INTRAMEDULLARY (IM) NAIL TIBIAL;  Surgeon: Johnny Bridge, MD;  Location: [place];  Service: Orthopedics;  Laterality: Left;  left tibial nail and open reduction internal fixation left fibula fracture  . Orif fibula fracture Left 09/09/2012    Procedure: OPEN REDUCTION INTERNAL FIXATION (ORIF) FIBULA FRACTURE;  Surgeon: Johnny Bridge, MD;  Location: [place];  Service: Orthopedics;  Laterality: Left;  . Colonoscopy N/A 10/28/2012    Procedure: COLONOSCOPY;  Surgeon: Jeryl Columbia, MD;  Location: [facility] ENDOSCOPY;  Service: Endoscopy;  Laterality: N/A;  . Esophagogastroduodenoscopy N/A 11/02/2012    Procedure: ESOPHAGOGASTRODUODENOSCOPY (EGD);  Surgeon: Cleotis Nipper, MD;  Location: [facility] ENDOSCOPY;  Service: Endoscopy;  Laterality: N/A;  . Colonoscopy N/A 11/02/2012    Procedure: COLONOSCOPY;  Surgeon: Cleotis Nipper, MD;  Location: [facility] ENDOSCOPY;  Service: Endoscopy;  Laterality: N/A;  . Colonoscopy N/A 11/03/2012    Procedure: COLONOSCOPY;  Surgeon: Cleotis Nipper, MD;  Location: [facility] ENDOSCOPY;  Service: Endoscopy;  Laterality: N/A;  . Givens capsule study N/A 11/04/2012    Procedure: GIVENS CAPSULE STUDY;  Surgeon: Cleotis Nipper, MD;  Location: [facility] ENDOSCOPY;  Service: Endoscopy;  Laterality: N/A;  . Enteroscopy N/A 11/08/2012    Procedure: ENTEROSCOPY;  Surgeon: Wonda Horner, MD;  Location: [facility] ENDOSCOPY;  Service: Endoscopy;  Laterality: N/A;  . Amputation Left 05/12/2013  Procedure: AMPUTATION RAY;  Surgeon: Newt Minion, MD;  Location: [place];  Service: Orthopedics;  Laterality: Left;  Left Foot 1st Ray Amputation  . Eye surgery Left     to remove scar tissue  . Amputation Left 06/09/2013    Procedure: AMPUTATION BELOW KNEE;  Surgeon: Newt Minion, MD;  Location: [place];  Service: Orthopedics;  Laterality: Left;  Left Below Knee Amputation and removal proximal screws IM tibial nail  . Hardware removal Left 06/09/2013    Procedure: HARDWARE REMOVAL;  Surgeon:  Newt Minion, MD;  Location: [place];  Service: Orthopedics;  Laterality: Left;  Left Below Knee Amputation  and Removal proximal screws IM tibial nail  . Amputation Right 09/08/2013    Procedure: AMPUTATION BELOW KNEE;  Surgeon: Newt Minion, MD;  Location: [place];  Service: Orthopedics;  Laterality: Right;  Right Below Knee Amputation  . Amputation Right 10/11/2013    Procedure: AMPUTATION BELOW KNEE;  Surgeon: Newt Minion, MD;  Location: [place];  Service: Orthopedics;  Laterality: Right;  Right Below Knee Amputation Revision    reports that he has never smoked. He has never used smokeless tobacco. He reports that he does not drink alcohol or use illicit drugs. family history includes Coronary artery disease in his other; Diabetes in his mother. No Known Allergies Current Outpatient Prescriptions on File Prior to Visit  Medication Sig Dispense Refill  . acetaminophen (TYLENOL) 500 MG tablet Take 500 mg by mouth every 6 (six) hours as needed for mild pain.    [name] amLODipine (NORVASC) 10 MG tablet TAKE 1 TABLET BY MOUTH DAILY 90 tablet 0  . calcium acetate (PHOSLO) 667 MG capsule Take 2 capsules (1,334 mg total) by mouth 3 (three) times daily with meals. 180 capsule 0  . hydrALAZINE (APRESOLINE) 100 MG tablet Take 1 tablet (100 mg total) by mouth every 8 (eight) hours. 90 tablet 11  . hydrOXYzine (ATARAX/VISTARIL) 25 MG tablet Take 1 tablet (25 mg total) by mouth every 6 (six) hours as needed. 40 tablet 2  . ketoconazole (NIZORAL) 2 % cream Apply 1 application topically daily. 30 g 1  . labetalol (NORMODYNE) 300 MG tablet Take 1 tablet (300 mg total) by mouth 3 (three) times daily. 90 tablet 11  . latanoprost (XALATAN) 0.005 % ophthalmic solution place 1 drop into right eye at bedtime  0  . lidocaine-prilocaine (EMLA) cream APPLY A SMALL AMOUNT TO SKIN AT THE ACCESS SITE (AVF) AS DIRECTED BEFORE EACH DIALYSIS SESSION THREE DAYS A WEEK  6  . PREVIDENT 5000 PLUS 1.1 % CREA dental cream See admin  instructions.  6  . SENSIPAR 30 MG tablet Take 1 tablet by mouth every evening.    . silver sulfADIAZINE (SILVADENE) 1 % cream Apply 1 application topically daily.    . VOLTAREN 1 % GEL APPLY 2 GRAMS TO HANDS BID TO TID  0   No current facility-administered medications on file prior to visit.    Review of Systems  Constitutional: Negative for unusual diaphoresis or night sweats HENT: Negative for ear swelling or discharge Eyes: Negative for worsening visual haziness  Respiratory: Negative for choking and stridor.   Gastrointestinal: Negative for distension or worsening eructation Genitourinary: Negative for retention or change in urine volume.  Musculoskeletal: Negative for other MSK pain or swelling Skin: Negative for color change and worsening wound Neurological: Negative for tremors and numbness other than noted  Psychiatric/Behavioral: Negative for decreased concentration or agitation other than above  Objective:   Physical Exam BP 130/70 mmHg  Pulse 82  Temp(Src) 98.4 F (36.9 C) (Oral)  Resp 20  Wt 197 lb (89.359 kg)  SpO2 97% VS noted, non toxic Constitutional: Pt appears in no apparent distress HENT: Head: NCAT.  Right Ear: External ear normal.  Left Ear: External ear normal.  Eyes: . Pupils are equal, round, and reactive to light. Conjunctivae and EOM are normal Neck: Normal range of motion. Neck supple.  Cardiovascular: Normal rate and regular rhythm.   Pulmonary/Chest: Effort normal and breath sounds without rales or wheezing.  Abd:  Soft, NT, ND, + BS - benign Neurological: Pt is alert. Not confused , motor grossly intact Skin: Skin is warm. No rash, no LE edema Psychiatric: Pt behavior is normal. No agitation.     Assessment & Plan:

## 2015-12-11 NOTE — Patient Instructions (Signed)

## 2015-12-13 ENCOUNTER — Encounter: Payer: Self-pay | Admitting: Internal Medicine

## 2015-12-13 ENCOUNTER — Other Ambulatory Visit: Payer: Self-pay | Admitting: Internal Medicine

## 2015-12-13 MED ORDER — ATORVASTATIN CALCIUM 10 MG PO TABS
10.0000 mg | ORAL_TABLET | Freq: Every day | ORAL | Status: DC
Start: 2015-12-13 — End: 2015-12-31

## 2015-12-14 DIAGNOSIS — D509 Iron deficiency anemia, unspecified: Secondary | ICD-10-CM | POA: Diagnosis not present

## 2015-12-14 DIAGNOSIS — E1122 Type 2 diabetes mellitus with diabetic chronic kidney disease: Secondary | ICD-10-CM | POA: Diagnosis not present

## 2015-12-14 DIAGNOSIS — N186 End stage renal disease: Secondary | ICD-10-CM | POA: Diagnosis not present

## 2015-12-14 DIAGNOSIS — N2581 Secondary hyperparathyroidism of renal origin: Secondary | ICD-10-CM | POA: Diagnosis not present

## 2015-12-14 DIAGNOSIS — D631 Anemia in chronic kidney disease: Secondary | ICD-10-CM | POA: Diagnosis not present

## 2015-12-15 NOTE — Assessment & Plan Note (Signed)
stable overall by history and exam, recent data reviewed with pt, and pt to continue medical treatment as before,  to f/u any worsening symptoms or concerns Lab Results  Component Value Date   LDLCALC 111* 12/11/2015

## 2015-12-15 NOTE — Assessment & Plan Note (Signed)
stable overall by history and exam, recent data reviewed with pt, and pt to continue medical treatment as before,  to f/u any worsening symptoms or concerns BP Readings from Last 3 Encounters:  12/11/15 130/70  12/09/15 138/83  11/13/15 128/70

## 2015-12-15 NOTE — Assessment & Plan Note (Signed)
?   Viral illness, with mult episodes watery nonbloody stool last 4 days, without pain, fever; doubt c diff but will need checked, tx pending results

## 2015-12-15 NOTE — Assessment & Plan Note (Signed)
stable overall by history and exam, recent data reviewed with pt, and pt to continue medical treatment as before,  to f/u any worsening symptoms or concerns Lab Results  Component Value Date   HGBA1C 6.2 12/11/2015

## 2015-12-16 ENCOUNTER — Other Ambulatory Visit: Payer: Medicare Other

## 2015-12-16 DIAGNOSIS — R197 Diarrhea, unspecified: Secondary | ICD-10-CM

## 2015-12-16 NOTE — Addendum Note (Signed)
Addended by: Cresenciano Lick on: 12/16/2015 05:12 PM   Modules accepted: Orders

## 2015-12-17 DIAGNOSIS — E1122 Type 2 diabetes mellitus with diabetic chronic kidney disease: Secondary | ICD-10-CM | POA: Diagnosis not present

## 2015-12-17 DIAGNOSIS — D631 Anemia in chronic kidney disease: Secondary | ICD-10-CM | POA: Diagnosis not present

## 2015-12-17 DIAGNOSIS — N2581 Secondary hyperparathyroidism of renal origin: Secondary | ICD-10-CM | POA: Diagnosis not present

## 2015-12-17 DIAGNOSIS — N186 End stage renal disease: Secondary | ICD-10-CM | POA: Diagnosis not present

## 2015-12-17 DIAGNOSIS — D509 Iron deficiency anemia, unspecified: Secondary | ICD-10-CM | POA: Diagnosis not present

## 2015-12-19 DIAGNOSIS — D631 Anemia in chronic kidney disease: Secondary | ICD-10-CM | POA: Diagnosis not present

## 2015-12-19 DIAGNOSIS — D509 Iron deficiency anemia, unspecified: Secondary | ICD-10-CM | POA: Diagnosis not present

## 2015-12-19 DIAGNOSIS — N2581 Secondary hyperparathyroidism of renal origin: Secondary | ICD-10-CM | POA: Diagnosis not present

## 2015-12-19 DIAGNOSIS — N186 End stage renal disease: Secondary | ICD-10-CM | POA: Diagnosis not present

## 2015-12-19 DIAGNOSIS — E1122 Type 2 diabetes mellitus with diabetic chronic kidney disease: Secondary | ICD-10-CM | POA: Diagnosis not present

## 2015-12-20 ENCOUNTER — Telehealth: Payer: Self-pay | Admitting: Internal Medicine

## 2015-12-20 NOTE — Telephone Encounter (Signed)
Pt wife called in and wanted to know the results of the Cdif Test?  She said that he is still having runny stools

## 2015-12-21 DIAGNOSIS — D509 Iron deficiency anemia, unspecified: Secondary | ICD-10-CM | POA: Diagnosis not present

## 2015-12-21 DIAGNOSIS — E1122 Type 2 diabetes mellitus with diabetic chronic kidney disease: Secondary | ICD-10-CM | POA: Diagnosis not present

## 2015-12-21 DIAGNOSIS — N2581 Secondary hyperparathyroidism of renal origin: Secondary | ICD-10-CM | POA: Diagnosis not present

## 2015-12-21 DIAGNOSIS — N186 End stage renal disease: Secondary | ICD-10-CM | POA: Diagnosis not present

## 2015-12-21 DIAGNOSIS — D631 Anemia in chronic kidney disease: Secondary | ICD-10-CM | POA: Diagnosis not present

## 2015-12-22 LAB — C DIFFICILE, CYTOTOXIN B

## 2015-12-22 LAB — C DIFFICILE TOXINS A+B W/RFLX: C DIFFICILE TOXINS A+B, EIA: NEGATIVE

## 2015-12-24 DIAGNOSIS — N2581 Secondary hyperparathyroidism of renal origin: Secondary | ICD-10-CM | POA: Diagnosis not present

## 2015-12-24 DIAGNOSIS — E1122 Type 2 diabetes mellitus with diabetic chronic kidney disease: Secondary | ICD-10-CM | POA: Diagnosis not present

## 2015-12-24 DIAGNOSIS — N186 End stage renal disease: Secondary | ICD-10-CM | POA: Diagnosis not present

## 2015-12-24 DIAGNOSIS — D509 Iron deficiency anemia, unspecified: Secondary | ICD-10-CM | POA: Diagnosis not present

## 2015-12-24 DIAGNOSIS — D631 Anemia in chronic kidney disease: Secondary | ICD-10-CM | POA: Diagnosis not present

## 2015-12-25 DIAGNOSIS — Z992 Dependence on renal dialysis: Secondary | ICD-10-CM | POA: Diagnosis not present

## 2015-12-25 DIAGNOSIS — E1122 Type 2 diabetes mellitus with diabetic chronic kidney disease: Secondary | ICD-10-CM | POA: Diagnosis not present

## 2015-12-25 DIAGNOSIS — N186 End stage renal disease: Secondary | ICD-10-CM | POA: Diagnosis not present

## 2015-12-26 ENCOUNTER — Emergency Department (HOSPITAL_COMMUNITY): Payer: Medicare Other

## 2015-12-26 ENCOUNTER — Inpatient Hospital Stay (HOSPITAL_COMMUNITY)
Admission: EM | Admit: 2015-12-26 | Discharge: 2015-12-31 | DRG: 308 | Disposition: A | Discharge status: Home / Self Care | Payer: Medicare Other | Attending: Internal Medicine | Admitting: Internal Medicine

## 2015-12-26 ENCOUNTER — Encounter (HOSPITAL_COMMUNITY): Payer: Self-pay | Admitting: Emergency Medicine

## 2015-12-26 DIAGNOSIS — I110 Hypertensive heart disease with heart failure: Secondary | ICD-10-CM | POA: Diagnosis present

## 2015-12-26 DIAGNOSIS — I132 Hypertensive heart and chronic kidney disease with heart failure and with stage 5 chronic kidney disease, or end stage renal disease: Secondary | ICD-10-CM | POA: Diagnosis not present

## 2015-12-26 DIAGNOSIS — Z8711 Personal history of peptic ulcer disease: Secondary | ICD-10-CM

## 2015-12-26 DIAGNOSIS — I248 Other forms of acute ischemic heart disease: Secondary | ICD-10-CM | POA: Diagnosis not present

## 2015-12-26 DIAGNOSIS — F329 Major depressive disorder, single episode, unspecified: Secondary | ICD-10-CM | POA: Diagnosis present

## 2015-12-26 DIAGNOSIS — R778 Other specified abnormalities of plasma proteins: Secondary | ICD-10-CM

## 2015-12-26 DIAGNOSIS — J811 Chronic pulmonary edema: Secondary | ICD-10-CM | POA: Diagnosis not present

## 2015-12-26 DIAGNOSIS — I639 Cerebral infarction, unspecified: Secondary | ICD-10-CM

## 2015-12-26 DIAGNOSIS — R4182 Altered mental status, unspecified: Secondary | ICD-10-CM | POA: Diagnosis not present

## 2015-12-26 DIAGNOSIS — N186 End stage renal disease: Secondary | ICD-10-CM | POA: Diagnosis present

## 2015-12-26 DIAGNOSIS — R2990 Unspecified symptoms and signs involving the nervous system: Secondary | ICD-10-CM | POA: Insufficient documentation

## 2015-12-26 DIAGNOSIS — E1122 Type 2 diabetes mellitus with diabetic chronic kidney disease: Secondary | ICD-10-CM | POA: Diagnosis not present

## 2015-12-26 DIAGNOSIS — I447 Left bundle-branch block, unspecified: Secondary | ICD-10-CM | POA: Insufficient documentation

## 2015-12-26 DIAGNOSIS — I4891 Unspecified atrial fibrillation: Secondary | ICD-10-CM

## 2015-12-26 DIAGNOSIS — Z833 Family history of diabetes mellitus: Secondary | ICD-10-CM

## 2015-12-26 DIAGNOSIS — Z89512 Acquired absence of left leg below knee: Secondary | ICD-10-CM

## 2015-12-26 DIAGNOSIS — R112 Nausea with vomiting, unspecified: Secondary | ICD-10-CM | POA: Diagnosis present

## 2015-12-26 DIAGNOSIS — Z992 Dependence on renal dialysis: Secondary | ICD-10-CM

## 2015-12-26 DIAGNOSIS — I5042 Chronic combined systolic (congestive) and diastolic (congestive) heart failure: Secondary | ICD-10-CM | POA: Diagnosis present

## 2015-12-26 DIAGNOSIS — D649 Anemia, unspecified: Secondary | ICD-10-CM

## 2015-12-26 DIAGNOSIS — E1151 Type 2 diabetes mellitus with diabetic peripheral angiopathy without gangrene: Secondary | ICD-10-CM | POA: Diagnosis present

## 2015-12-26 DIAGNOSIS — Z8673 Personal history of transient ischemic attack (TIA), and cerebral infarction without residual deficits: Secondary | ICD-10-CM

## 2015-12-26 DIAGNOSIS — E871 Hypo-osmolality and hyponatremia: Secondary | ICD-10-CM | POA: Diagnosis not present

## 2015-12-26 DIAGNOSIS — K219 Gastro-esophageal reflux disease without esophagitis: Secondary | ICD-10-CM | POA: Diagnosis present

## 2015-12-26 DIAGNOSIS — Z8249 Family history of ischemic heart disease and other diseases of the circulatory system: Secondary | ICD-10-CM

## 2015-12-26 DIAGNOSIS — Z79899 Other long term (current) drug therapy: Secondary | ICD-10-CM

## 2015-12-26 DIAGNOSIS — M545 Low back pain: Secondary | ICD-10-CM | POA: Diagnosis present

## 2015-12-26 DIAGNOSIS — I5043 Acute on chronic combined systolic (congestive) and diastolic (congestive) heart failure: Secondary | ICD-10-CM | POA: Diagnosis present

## 2015-12-26 DIAGNOSIS — B1920 Unspecified viral hepatitis C without hepatic coma: Secondary | ICD-10-CM | POA: Diagnosis present

## 2015-12-26 DIAGNOSIS — R197 Diarrhea, unspecified: Secondary | ICD-10-CM | POA: Diagnosis present

## 2015-12-26 DIAGNOSIS — N2581 Secondary hyperparathyroidism of renal origin: Secondary | ICD-10-CM | POA: Diagnosis not present

## 2015-12-26 DIAGNOSIS — I480 Paroxysmal atrial fibrillation: Principal | ICD-10-CM | POA: Diagnosis present

## 2015-12-26 DIAGNOSIS — Z905 Acquired absence of kidney: Secondary | ICD-10-CM

## 2015-12-26 DIAGNOSIS — R7989 Other specified abnormal findings of blood chemistry: Secondary | ICD-10-CM

## 2015-12-26 DIAGNOSIS — Z7982 Long term (current) use of aspirin: Secondary | ICD-10-CM

## 2015-12-26 DIAGNOSIS — R739 Hyperglycemia, unspecified: Secondary | ICD-10-CM

## 2015-12-26 DIAGNOSIS — R079 Chest pain, unspecified: Secondary | ICD-10-CM

## 2015-12-26 DIAGNOSIS — E785 Hyperlipidemia, unspecified: Secondary | ICD-10-CM | POA: Diagnosis present

## 2015-12-26 DIAGNOSIS — N400 Enlarged prostate without lower urinary tract symptoms: Secondary | ICD-10-CM | POA: Diagnosis present

## 2015-12-26 DIAGNOSIS — Z794 Long term (current) use of insulin: Secondary | ICD-10-CM

## 2015-12-26 DIAGNOSIS — Z89511 Acquired absence of right leg below knee: Secondary | ICD-10-CM

## 2015-12-26 DIAGNOSIS — R410 Disorientation, unspecified: Secondary | ICD-10-CM | POA: Insufficient documentation

## 2015-12-26 DIAGNOSIS — D631 Anemia in chronic kidney disease: Secondary | ICD-10-CM | POA: Diagnosis not present

## 2015-12-26 DIAGNOSIS — D509 Iron deficiency anemia, unspecified: Secondary | ICD-10-CM | POA: Diagnosis not present

## 2015-12-26 DIAGNOSIS — H409 Unspecified glaucoma: Secondary | ICD-10-CM | POA: Diagnosis present

## 2015-12-26 LAB — CBC
HCT: 39.6 % (ref 39.0–52.0)
HEMOGLOBIN: 12.8 g/dL — AB (ref 13.0–17.0)
MCH: 28.4 pg (ref 26.0–34.0)
MCHC: 32.3 g/dL (ref 30.0–36.0)
MCV: 87.8 fL (ref 78.0–100.0)
Platelets: 239 10*3/uL (ref 150–400)
RBC: 4.51 MIL/uL (ref 4.22–5.81)
RDW: 15.7 % — ABNORMAL HIGH (ref 11.5–15.5)
WBC: 6.4 10*3/uL (ref 4.0–10.5)

## 2015-12-26 LAB — BASIC METABOLIC PANEL
ANION GAP: 14 (ref 5–15)
BUN: 32 mg/dL — ABNORMAL HIGH (ref 6–20)
CALCIUM: 9.1 mg/dL (ref 8.9–10.3)
CO2: 25 mmol/L (ref 22–32)
Chloride: 95 mmol/L — ABNORMAL LOW (ref 101–111)
Creatinine, Ser: 6.67 mg/dL — ABNORMAL HIGH (ref 0.61–1.24)
GFR, EST AFRICAN AMERICAN: 9 mL/min — AB (ref >60)
GFR, EST NON AFRICAN AMERICAN: 8 mL/min — AB (ref >60)
Glucose, Bld: 147 mg/dL — ABNORMAL HIGH (ref 65–99)
POTASSIUM: 4.1 mmol/L (ref 3.5–5.1)
Sodium: 134 mmol/L — ABNORMAL LOW (ref 135–145)

## 2015-12-26 LAB — TROPONIN I: TROPONIN I: 0.05 ng/mL — AB (ref <0.031)

## 2015-12-26 LAB — CBG MONITORING, ED: GLUCOSE-CAPILLARY: 150 mg/dL — AB (ref 65–99)

## 2015-12-26 LAB — PROTIME-INR
INR: 1.19 (ref 0.00–1.49)
Prothrombin Time: 15.2 seconds (ref 11.6–15.2)

## 2015-12-26 MED ORDER — WARFARIN SODIUM 5 MG PO TABS
5.0000 mg | ORAL_TABLET | Freq: Once | ORAL | Status: AC
  Administered 2015-12-27: 5 mg via ORAL
  Filled 2015-12-26: qty 1

## 2015-12-26 MED ORDER — ONDANSETRON HCL 4 MG/2ML IJ SOLN
4.0000 mg | Freq: Once | INTRAMUSCULAR | Status: AC
  Administered 2015-12-26: 4 mg via INTRAVENOUS
  Filled 2015-12-26: qty 2

## 2015-12-26 MED ORDER — WARFARIN - PHARMACIST DOSING INPATIENT
Freq: Every day | Status: DC
  Administered 2015-12-27: 18:00:00

## 2015-12-26 MED ORDER — ENOXAPARIN SODIUM 100 MG/ML ~~LOC~~ SOLN
90.0000 mg | SUBCUTANEOUS | Status: DC
  Administered 2015-12-27: 90 mg via SUBCUTANEOUS
  Filled 2015-12-26: qty 1

## 2015-12-26 MED ORDER — DILTIAZEM LOAD VIA INFUSION
10.0000 mg | Freq: Once | INTRAVENOUS | Status: DC
  Filled 2015-12-26: qty 10

## 2015-12-26 MED ORDER — DILTIAZEM HCL 100 MG IV SOLR
5.0000 mg/h | INTRAVENOUS | Status: DC
  Administered 2015-12-27: 5 mg/h via INTRAVENOUS
  Filled 2015-12-26: qty 100

## 2015-12-26 NOTE — ED Notes (Addendum)
Pt states today after dialysis today he has just felt very felt and fatigued. HR in 120's afib in triage. Pt denies any chest pain or sob.   Pt denies hx of afib

## 2015-12-26 NOTE — Progress Notes (Signed)
ANTICOAGULATION CONSULT NOTE - Initial Consult  Pharmacy Consult for Lovenox and warfarin Indication: atrial fibrillation  No Known Allergies  Patient Measurements: Height: 5\' 10"  (177.8 cm) Weight: 197 lb (89.359 kg) IBW/kg (Calculated) : 73 Heparin Dosing Weight:   Vital Signs: Temp: 98.6 F (37 C) (06/01 2029) Temp Source: Oral (06/01 2029) BP: 110/79 mmHg (06/01 2131) Pulse Rate: 85 (06/01 2131)  Labs:  Recent Labs  12/26/15 2050  HGB 12.8*  HCT 39.6  PLT 239  CREATININE 6.67*    Estimated Creatinine Clearance: 12.6 mL/min (by C-G formula based on Cr of 6.67).   Medical History: Past Medical History  Diagnosis Date  . ESRD on hemodialysis ([place]) 05/05/2007    ESRD due to DM/HTN. Started dialysis in November 2013.  HD TTS at [facility] on [address].  [name] BACK PAIN, LUMBAR, CHRONIC 08/06/2009  . BENIGN PROSTATIC HYPERTROPHY 08/01/2010  . CEREBROVASCULAR ACCIDENT, HX OF 08/06/2009  . CHOLELITHIASIS 08/01/2010  . CONGESTIVE HEART FAILURE 03/18/2009  . DEPRESSION 03/18/2009  . DIABETES MELLITUS, TYPE II 03/25/2007  . ERECTILE DYSFUNCTION 03/25/2007  . GERD 03/25/2007  . HEPATITIS C, HX OF 03/25/2007  . HYPERTENSION 03/25/2007  . Morbid obesity ([place]) 03/25/2007  . NEPHROLITHIASIS, HX OF 03/18/2009  . Complication of anesthesia     wife states pt had trouble waking up with his last surgery in Nov., 2014  . History of Clostridium difficile   . Hemorrhoids   . Anemia   . Antral ulcer 2014    small    Medications:   (Not in a hospital admission)  Assessment: 65 yo male presents with fatigue, also is ESRD on HD, found to have new onset AFib. Discussed oral anticoagulants with provider, warfarin is best option due to ESRD. CHADSVASc =5.   Goal of Therapy:  Anti-Xa level 0.6-1 units/ml 4hrs after LMWH dose given Monitor platelets by anticoagulation protocol: Yes    Plan:  -Lovenox 90 mg [place] q24h -Warfarin 5 mg po x1 -Daily INR, CBC q72h     Harvel Quale 12/26/2015,10:01 PM

## 2015-12-26 NOTE — ED Provider Notes (Signed)
CSN: [id number]     Arrival date & time 12/26/15  2004 History   First MD Initiated Contact with Patient 12/26/15 2121     Chief Complaint  Patient presents with  . Weakness     (Consider location/radiation/quality/duration/timing/severity/associated sxs/prior Treatment) HPI   Pt is a 65 yo male with PMH of ESRD (on hemodialysis MWF), DM, CVA, CHF, Hep C and HTN who presents to the ED with complaint of weakness, onset 2 days. Pt reports having worsening weakness with associated nausea and NBNB vomiting for the past two days. He also reports having nonbloody diarrhea for the past few weeks. He notes he was seen by his PCP and had his stool tested for c diff (results pending). Pt reports after dialysis tx today be started having worsening fatigue. Denies fever, chills, body aches, HA, visual changes, lightheadedness, dizziness, cough, SOB, wheezing, CP, palpitations, abdominal pain, numbness, tingling.   Past Medical History  Diagnosis Date  . ESRD on hemodialysis ([place]) 05/05/2007    ESRD due to DM/HTN. Started dialysis in November 2013.  HD TTS at [facility] on [address].  [name] BACK PAIN, LUMBAR, CHRONIC 08/06/2009  . BENIGN PROSTATIC HYPERTROPHY 08/01/2010  . CEREBROVASCULAR ACCIDENT, HX OF 08/06/2009  . CHOLELITHIASIS 08/01/2010  . CONGESTIVE HEART FAILURE 03/18/2009  . DEPRESSION 03/18/2009  . DIABETES MELLITUS, TYPE II 03/25/2007  . ERECTILE DYSFUNCTION 03/25/2007  . GERD 03/25/2007  . HEPATITIS C, HX OF 03/25/2007  . HYPERTENSION 03/25/2007  . Morbid obesity ([place]) 03/25/2007  . NEPHROLITHIASIS, HX OF 03/18/2009  . Complication of anesthesia     wife states pt had trouble waking up with his last surgery in Nov., 2014  . History of Clostridium difficile   . Hemorrhoids   . Anemia   . Antral ulcer 2014    small   Past Surgical History  Procedure Laterality Date  . Nephrectomy      partial RR  . Av fistula placement  06/14/2012    Procedure: ARTERIOVENOUS (AV) FISTULA CREATION;  Surgeon:  Angelia Mould, MD;  Location: [facility] OR;  Service: Vascular;  Laterality: Left;  Left basilic vein transposition with fistula.  . Tibia im nail insertion Left 09/09/2012    Procedure: INTRAMEDULLARY (IM) NAIL TIBIAL;  Surgeon: Johnny Bridge, MD;  Location: [place];  Service: Orthopedics;  Laterality: Left;  left tibial nail and open reduction internal fixation left fibula fracture  . Orif fibula fracture Left 09/09/2012    Procedure: OPEN REDUCTION INTERNAL FIXATION (ORIF) FIBULA FRACTURE;  Surgeon: Johnny Bridge, MD;  Location: [place];  Service: Orthopedics;  Laterality: Left;  . Colonoscopy N/A 10/28/2012    Procedure: COLONOSCOPY;  Surgeon: Jeryl Columbia, MD;  Location: [facility] ENDOSCOPY;  Service: Endoscopy;  Laterality: N/A;  . Esophagogastroduodenoscopy N/A 11/02/2012    Procedure: ESOPHAGOGASTRODUODENOSCOPY (EGD);  Surgeon: Cleotis Nipper, MD;  Location: [facility] ENDOSCOPY;  Service: Endoscopy;  Laterality: N/A;  . Colonoscopy N/A 11/02/2012    Procedure: COLONOSCOPY;  Surgeon: Cleotis Nipper, MD;  Location: [facility] ENDOSCOPY;  Service: Endoscopy;  Laterality: N/A;  . Colonoscopy N/A 11/03/2012    Procedure: COLONOSCOPY;  Surgeon: Cleotis Nipper, MD;  Location: [facility] ENDOSCOPY;  Service: Endoscopy;  Laterality: N/A;  . Givens capsule study N/A 11/04/2012    Procedure: GIVENS CAPSULE STUDY;  Surgeon: Cleotis Nipper, MD;  Location: [facility] ENDOSCOPY;  Service: Endoscopy;  Laterality: N/A;  . Enteroscopy N/A 11/08/2012    Procedure: ENTEROSCOPY;  Surgeon: Wonda Horner, MD;  Location: [facility] ENDOSCOPY;  Service: Endoscopy;  Laterality: N/A;  . Amputation Left 05/12/2013    Procedure: AMPUTATION RAY;  Surgeon: Newt Minion, MD;  Location: [place];  Service: Orthopedics;  Laterality: Left;  Left Foot 1st Ray Amputation  . Eye surgery Left     to remove scar tissue  . Amputation Left 06/09/2013    Procedure: AMPUTATION BELOW KNEE;  Surgeon: Newt Minion, MD;  Location: [place];  Service: Orthopedics;  Laterality: Left;   Left Below Knee Amputation and removal proximal screws IM tibial nail  . Hardware removal Left 06/09/2013    Procedure: HARDWARE REMOVAL;  Surgeon: Newt Minion, MD;  Location: [place];  Service: Orthopedics;  Laterality: Left;  Left Below Knee Amputation  and Removal proximal screws IM tibial nail  . Amputation Right 09/08/2013    Procedure: AMPUTATION BELOW KNEE;  Surgeon: Newt Minion, MD;  Location: [place];  Service: Orthopedics;  Laterality: Right;  Right Below Knee Amputation  . Amputation Right 10/11/2013    Procedure: AMPUTATION BELOW KNEE;  Surgeon: Newt Minion, MD;  Location: [place];  Service: Orthopedics;  Laterality: Right;  Right Below Knee Amputation Revision   Family History  Problem Relation Age of Onset  . Coronary artery disease Other   . Diabetes Mother    Social History  Substance Use Topics  . Smoking status: Never Smoker   . Smokeless tobacco: Never Used  . Alcohol Use: No    Review of Systems  Gastrointestinal: Positive for nausea, vomiting and diarrhea.  Neurological: Positive for weakness.  All other systems reviewed and are negative.     Allergies  Review of patient's allergies indicates no known allergies.  Home Medications   Prior to Admission medications   Medication Sig Start Date End Date Taking? Authorizing Provider  acetaminophen (TYLENOL) 500 MG tablet Take 500 mg by mouth every 6 (six) hours as needed for mild pain.   Yes Historical Provider, MD  amLODipine (NORVASC) 10 MG tablet TAKE 1 TABLET BY MOUTH DAILY Patient taking differently: TAKE 1 TABLET BY MOUTH every evening 11/04/15  Yes [name], MD  aspirin EC 81 MG tablet Take 81 mg by mouth daily.   Yes Historical Provider, MD  atorvastatin (LIPITOR) 10 MG tablet Take 1 tablet (10 mg total) by mouth daily. Patient taking differently: Take 10 mg by mouth daily at 6 PM.  12/13/15  Yes [name], MD  calcium acetate (PHOSLO) 667 MG capsule Take 2 capsules (1,334 mg total) by mouth 3  (three) times daily with meals. Patient taking differently: Take 2,001 mg by mouth 3 (three) times daily with meals.  06/21/12  Yes Sorin June Leap, MD  hydrALAZINE (APRESOLINE) 100 MG tablet Take 1 tablet (100 mg total) by mouth every 8 (eight) hours. 09/10/14  Yes [name], MD  hydrOXYzine (ATARAX/VISTARIL) 25 MG tablet Take 1 tablet (25 mg total) by mouth every 6 (six) hours as needed. Patient taking differently: Take 25 mg by mouth every 6 (six) hours as needed for itching.  11/18/15  Yes [name], MD  ketoconazole (NIZORAL) 2 % cream Apply 1 application topically daily. 11/13/15  Yes [name], MD  labetalol (NORMODYNE) 300 MG tablet Take 1 tablet (300 mg total) by mouth 3 (three) times daily. Patient taking differently: Take 300 mg by mouth 2 (two) times daily.  07/24/14  Yes [name], MD  latanoprost (XALATAN) 0.005 % ophthalmic solution place 1 drop  into right eye at bedtime 09/13/15  Yes Historical Provider, MD  lidocaine-prilocaine (EMLA) cream APPLY A SMALL AMOUNT TO SKIN AT THE ACCESS SITE (AVF) AS DIRECTED BEFORE EACH DIALYSIS SESSION THREE DAYS A WEEK 06/03/15  Yes Historical Provider, MD  PREVIDENT 5000 PLUS 1.1 % CREA dental cream Place 1 application onto teeth every morning.  04/24/15  Yes Historical Provider, MD  SENSIPAR 30 MG tablet Take 30 mg by mouth every evening.  01/09/15  Yes Historical Provider, MD  silver sulfADIAZINE (SILVADENE) 1 % cream Apply 1 application topically daily.   Yes Historical Provider, MD  Skin Protectants, Misc. (EUCERIN) cream Apply 1 application topically as needed for dry skin.   Yes Historical Provider, MD  VOLTAREN 1 % GEL APPLY 2 GRAMS TO HANDS BID TO TID 09/19/15  Yes Historical Provider, MD   BP 124/80 mmHg  Pulse 57  Temp(Src) 98.6 F (37 C) (Oral)  Resp 17  Ht 5\' 10"  (1.778 m)  Wt 89.359 kg  BMI 28.27 kg/m2  SpO2 100% Physical Exam  Constitutional: He is oriented to person, place, and time. He appears well-developed and  well-nourished.  Pt appears fatigued  HENT:  Head: Normocephalic and atraumatic.  Mouth/Throat: Oropharynx is clear and moist. No oropharyngeal exudate.  Eyes: Conjunctivae and EOM are normal. Pupils are equal, round, and reactive to light. Right eye exhibits no discharge. Left eye exhibits no discharge. No scleral icterus.  Neck: Normal range of motion. Neck supple.  Cardiovascular: Normal heart sounds and intact distal pulses.   Afib, rate 110  Pulmonary/Chest: Effort normal and breath sounds normal. No respiratory distress. He has no wheezes. He has no rales. He exhibits no tenderness.  Abdominal: Soft. Bowel sounds are normal. He exhibits no distension and no mass. There is no tenderness. There is no rebound and no guarding.  Musculoskeletal: He exhibits no edema.  Bilateral BKA  Neurological: He is alert and oriented to person, place, and time.  Skin: Skin is warm and dry. He is not diaphoretic.  Nursing note and vitals reviewed.   ED Course  Procedures (including critical care time) Labs Review Labs Reviewed  BASIC METABOLIC PANEL - Abnormal; Notable for the following:    Sodium 134 (*)    Chloride 95 (*)    Glucose, Bld 147 (*)    BUN 32 (*)    Creatinine, Ser 6.67 (*)    GFR calc non Af Amer 8 (*)    GFR calc Af Amer 9 (*)    All other components within normal limits  CBC - Abnormal; Notable for the following:    Hemoglobin 12.8 (*)    RDW 15.7 (*)    All other components within normal limits  TROPONIN I - Abnormal; Notable for the following:    Troponin I 0.05 (*)    All other components within normal limits  CBG MONITORING, ED - Abnormal; Notable for the following:    Glucose-Capillary 150 (*)    All other components within normal limits  PROTIME-INR  PROTIME-INR  CBC    Imaging Review Dg Chest 2 View  12/26/2015  CLINICAL DATA:  Acute onset of generalized weakness and vomiting. Initial encounter. EXAM: CHEST  2 VIEW COMPARISON:  Chest radiograph performed  10/22/2013 FINDINGS: The lungs are well-aerated. Mild vascular congestion is noted. There is no evidence of focal opacification, pleural effusion or pneumothorax. The heart is normal in size; the mediastinal contour is within normal limits. No acute osseous abnormalities are seen. A vascular stent  is noted at the left arm. IMPRESSION: Mild vascular congestion noted.  Lungs remain grossly clear. Electronically Signed   By: Garald Balding M.D.   On: 12/26/2015 22:29   I have personally reviewed and evaluated these images and lab results as part of my medical decision-making.   EKG Interpretation   Date/Time:  Thursday December 26 2015 20:21:03 EDT Ventricular Rate:  123 PR Interval:    QRS Duration: 148 QT Interval:  398 QTC Calculation: 569 R Axis:   -139 Text Interpretation:  Atrial fibrillation with rapid ventricular response  Right superior axis deviation Left ventricular hypertrophy with QRS  widening Inferior infarct , age undetermined Abnormal ECG Confirmed by  [facility] MD, Corene Cornea [id number]) on 12/26/2015 8:27:52 PM      MDM   Final diagnoses:  New onset a-fib ([place])   Pt presents with worsening weakness and also reports having N/V for the past few days and diarrhea for the past week. Chart review shows negative c. Diff result which was ordered by PCP. Cardiac monitor in triage revealed pt to be in afib with rate 123. Pt denies hx of afib. Denies CP, palpitations or SOB. On my exam, HR 110, pt appears mildly fatigued, remaining exam unremarkable. CHADS-VASc score 5.   Na 134, Cl 94, trop mildly elevated at 0.05, labs otherwise appear to be at pt's baseline. INR 1.19. CXR showed mild vascular congestion. EKG showed afib with RVR. I spoke with pharmacy regarding initiation of anticoagulants, due to pt's renal failure they recommended starting pt on lovenox. Orders placed for lovenox and diltiazem.   Consulted hospitalist for admission. Dr. Olevia Bowens agrees to admission. Orders placed for stepdown  bed. Discussed results and plan for admission with pt and family.  This patients CHA2DS2-VASc Score and unadjusted Ischemic Stroke Rate (% per year) is equal to 7.2 % stroke rate/year from a score of 5  Above score calculated as 1 point each if present [CHF, HTN, DM, Vascular=MI/PAD/Aortic Plaque, Age if 65-74, or Male] Above score calculated as 2 points each if present [Age > 75, or Stroke/TIA/TE]    Nona Dell, PA-C 12/27/15 0034  Alfonzo Beers, MD 12/27/15 [phone]

## 2015-12-27 ENCOUNTER — Inpatient Hospital Stay (HOSPITAL_COMMUNITY): Payer: Medicare Other

## 2015-12-27 ENCOUNTER — Encounter (HOSPITAL_COMMUNITY): Payer: Self-pay | Admitting: Internal Medicine

## 2015-12-27 DIAGNOSIS — Z89512 Acquired absence of left leg below knee: Secondary | ICD-10-CM | POA: Diagnosis not present

## 2015-12-27 DIAGNOSIS — R2990 Unspecified symptoms and signs involving the nervous system: Secondary | ICD-10-CM | POA: Diagnosis not present

## 2015-12-27 DIAGNOSIS — H409 Unspecified glaucoma: Secondary | ICD-10-CM | POA: Diagnosis present

## 2015-12-27 DIAGNOSIS — B1920 Unspecified viral hepatitis C without hepatic coma: Secondary | ICD-10-CM | POA: Diagnosis present

## 2015-12-27 DIAGNOSIS — I4891 Unspecified atrial fibrillation: Secondary | ICD-10-CM

## 2015-12-27 DIAGNOSIS — E785 Hyperlipidemia, unspecified: Secondary | ICD-10-CM | POA: Diagnosis not present

## 2015-12-27 DIAGNOSIS — E1129 Type 2 diabetes mellitus with other diabetic kidney complication: Secondary | ICD-10-CM | POA: Diagnosis not present

## 2015-12-27 DIAGNOSIS — I10 Essential (primary) hypertension: Secondary | ICD-10-CM | POA: Diagnosis not present

## 2015-12-27 DIAGNOSIS — I5042 Chronic combined systolic (congestive) and diastolic (congestive) heart failure: Secondary | ICD-10-CM | POA: Diagnosis not present

## 2015-12-27 DIAGNOSIS — Z8249 Family history of ischemic heart disease and other diseases of the circulatory system: Secondary | ICD-10-CM | POA: Diagnosis not present

## 2015-12-27 DIAGNOSIS — I447 Left bundle-branch block, unspecified: Secondary | ICD-10-CM | POA: Diagnosis not present

## 2015-12-27 DIAGNOSIS — Z8711 Personal history of peptic ulcer disease: Secondary | ICD-10-CM | POA: Diagnosis not present

## 2015-12-27 DIAGNOSIS — Z89511 Acquired absence of right leg below knee: Secondary | ICD-10-CM | POA: Diagnosis not present

## 2015-12-27 DIAGNOSIS — I132 Hypertensive heart and chronic kidney disease with heart failure and with stage 5 chronic kidney disease, or end stage renal disease: Secondary | ICD-10-CM | POA: Diagnosis present

## 2015-12-27 DIAGNOSIS — I5032 Chronic diastolic (congestive) heart failure: Secondary | ICD-10-CM | POA: Diagnosis not present

## 2015-12-27 DIAGNOSIS — I248 Other forms of acute ischemic heart disease: Secondary | ICD-10-CM | POA: Diagnosis present

## 2015-12-27 DIAGNOSIS — M545 Low back pain: Secondary | ICD-10-CM | POA: Diagnosis present

## 2015-12-27 DIAGNOSIS — Z7982 Long term (current) use of aspirin: Secondary | ICD-10-CM | POA: Diagnosis not present

## 2015-12-27 DIAGNOSIS — E871 Hypo-osmolality and hyponatremia: Secondary | ICD-10-CM | POA: Diagnosis not present

## 2015-12-27 DIAGNOSIS — F329 Major depressive disorder, single episode, unspecified: Secondary | ICD-10-CM | POA: Diagnosis present

## 2015-12-27 DIAGNOSIS — E1122 Type 2 diabetes mellitus with diabetic chronic kidney disease: Secondary | ICD-10-CM | POA: Diagnosis present

## 2015-12-27 DIAGNOSIS — Z833 Family history of diabetes mellitus: Secondary | ICD-10-CM | POA: Diagnosis not present

## 2015-12-27 DIAGNOSIS — Z8673 Personal history of transient ischemic attack (TIA), and cerebral infarction without residual deficits: Secondary | ICD-10-CM | POA: Diagnosis not present

## 2015-12-27 DIAGNOSIS — Z794 Long term (current) use of insulin: Secondary | ICD-10-CM | POA: Diagnosis not present

## 2015-12-27 DIAGNOSIS — R4182 Altered mental status, unspecified: Secondary | ICD-10-CM | POA: Diagnosis not present

## 2015-12-27 DIAGNOSIS — R410 Disorientation, unspecified: Secondary | ICD-10-CM | POA: Diagnosis not present

## 2015-12-27 DIAGNOSIS — I480 Paroxysmal atrial fibrillation: Secondary | ICD-10-CM | POA: Diagnosis present

## 2015-12-27 DIAGNOSIS — Z905 Acquired absence of kidney: Secondary | ICD-10-CM | POA: Diagnosis not present

## 2015-12-27 DIAGNOSIS — Z79899 Other long term (current) drug therapy: Secondary | ICD-10-CM | POA: Diagnosis not present

## 2015-12-27 DIAGNOSIS — R197 Diarrhea, unspecified: Secondary | ICD-10-CM | POA: Diagnosis present

## 2015-12-27 DIAGNOSIS — R112 Nausea with vomiting, unspecified: Secondary | ICD-10-CM | POA: Diagnosis present

## 2015-12-27 DIAGNOSIS — Z992 Dependence on renal dialysis: Secondary | ICD-10-CM | POA: Diagnosis not present

## 2015-12-27 DIAGNOSIS — N186 End stage renal disease: Secondary | ICD-10-CM | POA: Diagnosis not present

## 2015-12-27 DIAGNOSIS — R079 Chest pain, unspecified: Secondary | ICD-10-CM | POA: Diagnosis not present

## 2015-12-27 DIAGNOSIS — E1151 Type 2 diabetes mellitus with diabetic peripheral angiopathy without gangrene: Secondary | ICD-10-CM | POA: Diagnosis present

## 2015-12-27 DIAGNOSIS — K219 Gastro-esophageal reflux disease without esophagitis: Secondary | ICD-10-CM | POA: Diagnosis present

## 2015-12-27 DIAGNOSIS — R7989 Other specified abnormal findings of blood chemistry: Secondary | ICD-10-CM | POA: Diagnosis not present

## 2015-12-27 DIAGNOSIS — N400 Enlarged prostate without lower urinary tract symptoms: Secondary | ICD-10-CM | POA: Diagnosis present

## 2015-12-27 DIAGNOSIS — R29810 Facial weakness: Secondary | ICD-10-CM | POA: Diagnosis not present

## 2015-12-27 DIAGNOSIS — I120 Hypertensive chronic kidney disease with stage 5 chronic kidney disease or end stage renal disease: Secondary | ICD-10-CM | POA: Diagnosis not present

## 2015-12-27 LAB — TROPONIN I
TROPONIN I: 0.05 ng/mL — AB (ref <0.031)
TROPONIN I: 0.05 ng/mL — AB (ref <0.031)
Troponin I: 0.05 ng/mL — ABNORMAL HIGH (ref <0.031)

## 2015-12-27 LAB — ECHOCARDIOGRAM COMPLETE
Height: 70 in
WEIGHTICAEL: 2859.2 [oz_av]

## 2015-12-27 LAB — GLUCOSE, CAPILLARY
Glucose-Capillary: 137 mg/dL — ABNORMAL HIGH (ref 65–99)
Glucose-Capillary: 126 mg/dL — ABNORMAL HIGH (ref 65–99)
Glucose-Capillary: 168 mg/dL — ABNORMAL HIGH (ref 65–99)
Glucose-Capillary: 122 mg/dL — ABNORMAL HIGH (ref 65–99)
Glucose-Capillary: 126 mg/dL — ABNORMAL HIGH (ref 65–99)

## 2015-12-27 LAB — PROTIME-INR
INR: 1.1 (ref 0.00–1.49)
PROTHROMBIN TIME: 14.4 s (ref 11.6–15.2)

## 2015-12-27 LAB — CBC
HCT: 41.9 % (ref 39.0–52.0)
Hemoglobin: 13.4 g/dL (ref 13.0–17.0)
MCH: 28.9 pg (ref 26.0–34.0)
MCHC: 32 g/dL (ref 30.0–36.0)
MCV: 90.5 fL (ref 78.0–100.0)
PLATELETS: 236 10*3/uL (ref 150–400)
RBC: 4.63 MIL/uL (ref 4.22–5.81)
RDW: 16.1 % — ABNORMAL HIGH (ref 11.5–15.5)
WBC: 7.8 10*3/uL (ref 4.0–10.5)

## 2015-12-27 LAB — MAGNESIUM: MAGNESIUM: 2.2 mg/dL (ref 1.7–2.4)

## 2015-12-27 LAB — MRSA PCR SCREENING: MRSA by PCR: NEGATIVE

## 2015-12-27 MED ORDER — HYDROXYZINE HCL 25 MG PO TABS: 25.0000 mg | ORAL_TABLET | Freq: Four times a day (QID) | ORAL | Status: DC | PRN

## 2015-12-27 MED ORDER — HYDROCERIN EX CREA
1.0000 "application " | TOPICAL_CREAM | CUTANEOUS | Status: DC | PRN
  Filled 2015-12-27: qty 113

## 2015-12-27 MED ORDER — ENOXAPARIN SODIUM 80 MG/0.8ML ~~LOC~~ SOLN: 1.0000 mg/kg | SUBCUTANEOUS | Status: DC

## 2015-12-27 MED ORDER — METOPROLOL TARTRATE 12.5 MG HALF TABLET
12.5000 mg | ORAL_TABLET | Freq: Four times a day (QID) | ORAL | Status: DC
  Administered 2015-12-27 – 2015-12-29 (×8): 12.5 mg via ORAL
  Filled 2015-12-27 (×8): qty 1

## 2015-12-27 MED ORDER — CALCIUM ACETATE (PHOS BINDER) 667 MG PO CAPS
2001.0000 mg | ORAL_CAPSULE | Freq: Three times a day (TID) | ORAL | Status: DC
  Administered 2015-12-27 – 2015-12-31 (×9): 2001 mg via ORAL
  Filled 2015-12-27 (×9): qty 3

## 2015-12-27 MED ORDER — INSULIN ASPART 100 UNIT/ML ~~LOC~~ SOLN
0.0000 [IU] | Freq: Three times a day (TID) | SUBCUTANEOUS | Status: DC
  Administered 2015-12-27 (×3): 2 [IU] via SUBCUTANEOUS
  Administered 2015-12-28 – 2015-12-29 (×2): 3 [IU] via SUBCUTANEOUS
  Administered 2015-12-30: 2 [IU] via SUBCUTANEOUS

## 2015-12-27 MED ORDER — ALPRAZOLAM 0.25 MG PO TABS
0.2500 mg | ORAL_TABLET | Freq: Three times a day (TID) | ORAL | Status: DC | PRN
  Administered 2015-12-27 – 2015-12-29 (×2): 0.25 mg via ORAL
  Filled 2015-12-27 (×3): qty 1

## 2015-12-27 MED ORDER — LATANOPROST 0.005 % OP SOLN
1.0000 [drp] | Freq: Every day | OPHTHALMIC | Status: DC
  Administered 2015-12-27 – 2015-12-30 (×5): 1 [drp] via OPHTHALMIC
  Filled 2015-12-27: qty 2.5

## 2015-12-27 MED ORDER — HYDRALAZINE HCL 50 MG PO TABS: 100.0000 mg | ORAL_TABLET | Freq: Three times a day (TID) | ORAL | Status: DC

## 2015-12-27 MED ORDER — ASPIRIN EC 81 MG PO TBEC
81.0000 mg | DELAYED_RELEASE_TABLET | Freq: Every day | ORAL | Status: DC
Start: 2015-12-27 — End: 2015-12-31
  Administered 2015-12-27 – 2015-12-31 (×4): 81 mg via ORAL
  Filled 2015-12-27 (×4): qty 1

## 2015-12-27 MED ORDER — SODIUM CHLORIDE 0.9 % IV SOLN
INTRAVENOUS | Status: DC
  Administered 2015-12-27: 15:00:00 via INTRAVENOUS

## 2015-12-27 MED ORDER — LABETALOL HCL 200 MG PO TABS
300.0000 mg | ORAL_TABLET | Freq: Two times a day (BID) | ORAL | Status: DC
  Administered 2015-12-27 (×2): 300 mg via ORAL
  Filled 2015-12-27 (×2): qty 1

## 2015-12-27 MED ORDER — HEPARIN (PORCINE) IN NACL 100-0.45 UNIT/ML-% IJ SOLN: 800.0000 [IU]/h | INTRAMUSCULAR | Status: DC

## 2015-12-27 MED ORDER — CINACALCET HCL 30 MG PO TABS
30.0000 mg | ORAL_TABLET | Freq: Every evening | ORAL | Status: DC
  Administered 2015-12-27 – 2015-12-30 (×4): 30 mg via ORAL
  Filled 2015-12-27 (×5): qty 1

## 2015-12-27 MED ORDER — ONDANSETRON HCL 4 MG PO TABS: 4.0000 mg | ORAL_TABLET | Freq: Four times a day (QID) | ORAL | Status: DC | PRN

## 2015-12-27 MED ORDER — ONDANSETRON HCL 4 MG/2ML IJ SOLN: 4.0000 mg | Freq: Four times a day (QID) | INTRAMUSCULAR | Status: DC | PRN

## 2015-12-27 MED ORDER — ACETAMINOPHEN 500 MG PO TABS: 500.0000 mg | ORAL_TABLET | Freq: Four times a day (QID) | ORAL | Status: DC | PRN

## 2015-12-27 MED ORDER — INSULIN ASPART 100 UNIT/ML ~~LOC~~ SOLN: 0.0000 [IU] | Freq: Every day | SUBCUTANEOUS | Status: DC

## 2015-12-27 MED ORDER — WARFARIN SODIUM 5 MG PO TABS
5.0000 mg | ORAL_TABLET | Freq: Once | ORAL | Status: AC
  Administered 2015-12-27: 5 mg via ORAL
  Filled 2015-12-27: qty 1

## 2015-12-27 MED ORDER — PANTOPRAZOLE SODIUM 40 MG PO TBEC
40.0000 mg | DELAYED_RELEASE_TABLET | Freq: Every day | ORAL | Status: DC
  Administered 2015-12-27 – 2015-12-31 (×5): 40 mg via ORAL
  Filled 2015-12-27 (×5): qty 1

## 2015-12-27 MED ORDER — ATORVASTATIN CALCIUM 10 MG PO TABS
10.0000 mg | ORAL_TABLET | Freq: Every day | ORAL | Status: DC
  Administered 2015-12-27 – 2015-12-30 (×4): 10 mg via ORAL
  Filled 2015-12-27 (×5): qty 1

## 2015-12-27 MED ORDER — HEPARIN (PORCINE) IN NACL 100-0.45 UNIT/ML-% IJ SOLN
900.0000 [IU]/h | INTRAMUSCULAR | Status: DC
  Administered 2015-12-27: 800 [IU]/h via INTRAVENOUS
  Administered 2015-12-28: 900 [IU]/h via INTRAVENOUS
  Administered 2015-12-29: 950 [IU]/h via INTRAVENOUS
  Filled 2015-12-27 (×3): qty 250

## 2015-12-27 NOTE — Consult Note (Signed)
Requesting Physician: Dr. Algis Liming    Reason for consultation:  Stroke code   HPI:                                                                                                                                         Oscar Castillo is an 65 y.o. male patient  withmedical history significant of ESRD on hemodialysis, type 2 diabetes, hypertension, depression, GERD, history of hepatitis C, anemia, BPH, chronic combined diastolic and systolic CHF with last ejection fraction measured at 45%, admitted with new-onset atrial fibrillation, currently on Coumadin, with heparin bridge.   During routine care, nurse noticed a new onset of confusion, trouble answering questions appropriately and some slurred speech symptoms. Due to suspicion for an acute stroke given his risk factors, his stroke code was called by the nurse. The time of my evaluation, patient was able to answer questions appropriately. He is not sure about this incident, denies any new neurological symptoms. He did appear drowsy.   Date last known well:  2017, June 2nd Time last known well:  Unknown tPA Given: No: non focal  Stroke Risk Factors - atrial fibrillation, diabetes mellitus and hypertension  Past Medical History: Past Medical History  Diagnosis Date  . ESRD on hemodialysis ([place]) 05/05/2007    ESRD due to DM/HTN. Started dialysis in November 2013.  HD TTS at [facility] on [address].  [name] BACK PAIN, LUMBAR, CHRONIC 08/06/2009  . BENIGN PROSTATIC HYPERTROPHY 08/01/2010  . CEREBROVASCULAR ACCIDENT, HX OF 08/06/2009  . CHOLELITHIASIS 08/01/2010  . CONGESTIVE HEART FAILURE 03/18/2009  . DEPRESSION 03/18/2009  . DIABETES MELLITUS, TYPE II 03/25/2007  . ERECTILE DYSFUNCTION 03/25/2007  . GERD 03/25/2007  . HEPATITIS C, HX OF 03/25/2007  . HYPERTENSION 03/25/2007  . Morbid obesity ([place]) 03/25/2007  . NEPHROLITHIASIS, HX OF 03/18/2009  . Complication of anesthesia     wife states pt had trouble waking up with his last surgery in Nov., 2014   . History of Clostridium difficile   . Hemorrhoids   . Anemia   . Antral ulcer 2014    small    Past Surgical History  Procedure Laterality Date  . Nephrectomy      partial RR  . Av fistula placement  06/14/2012    Procedure: ARTERIOVENOUS (AV) FISTULA CREATION;  Surgeon: Angelia Mould, MD;  Location: [facility] OR;  Service: Vascular;  Laterality: Left;  Left basilic vein transposition with fistula.  . Tibia im nail insertion Left 09/09/2012    Procedure: INTRAMEDULLARY (IM) NAIL TIBIAL;  Surgeon: Johnny Bridge, MD;  Location: [place];  Service: Orthopedics;  Laterality: Left;  left tibial nail and open reduction internal fixation left fibula fracture  . Orif fibula fracture Left 09/09/2012    Procedure: OPEN REDUCTION INTERNAL FIXATION (ORIF) FIBULA FRACTURE;  Surgeon: Johnny Bridge, MD;  Location: [place];  Service: Orthopedics;  Laterality:  Left;  . Colonoscopy N/A 10/28/2012    Procedure: COLONOSCOPY;  Surgeon: Jeryl Columbia, MD;  Location: [facility] ENDOSCOPY;  Service: Endoscopy;  Laterality: N/A;  . Esophagogastroduodenoscopy N/A 11/02/2012    Procedure: ESOPHAGOGASTRODUODENOSCOPY (EGD);  Surgeon: Cleotis Nipper, MD;  Location: [facility] ENDOSCOPY;  Service: Endoscopy;  Laterality: N/A;  . Colonoscopy N/A 11/02/2012    Procedure: COLONOSCOPY;  Surgeon: Cleotis Nipper, MD;  Location: [facility] ENDOSCOPY;  Service: Endoscopy;  Laterality: N/A;  . Colonoscopy N/A 11/03/2012    Procedure: COLONOSCOPY;  Surgeon: Cleotis Nipper, MD;  Location: [facility] ENDOSCOPY;  Service: Endoscopy;  Laterality: N/A;  . Givens capsule study N/A 11/04/2012    Procedure: GIVENS CAPSULE STUDY;  Surgeon: Cleotis Nipper, MD;  Location: [facility] ENDOSCOPY;  Service: Endoscopy;  Laterality: N/A;  . Enteroscopy N/A 11/08/2012    Procedure: ENTEROSCOPY;  Surgeon: Wonda Horner, MD;  Location: [facility] ENDOSCOPY;  Service: Endoscopy;  Laterality: N/A;  . Amputation Left 05/12/2013    Procedure: AMPUTATION RAY;  Surgeon: Newt Minion, MD;  Location:  [place];  Service: Orthopedics;  Laterality: Left;  Left Foot 1st Ray Amputation  . Eye surgery Left     to remove scar tissue  . Amputation Left 06/09/2013    Procedure: AMPUTATION BELOW KNEE;  Surgeon: Newt Minion, MD;  Location: [place];  Service: Orthopedics;  Laterality: Left;  Left Below Knee Amputation and removal proximal screws IM tibial nail  . Hardware removal Left 06/09/2013    Procedure: HARDWARE REMOVAL;  Surgeon: Newt Minion, MD;  Location: [place];  Service: Orthopedics;  Laterality: Left;  Left Below Knee Amputation  and Removal proximal screws IM tibial nail  . Amputation Right 09/08/2013    Procedure: AMPUTATION BELOW KNEE;  Surgeon: Newt Minion, MD;  Location: [place];  Service: Orthopedics;  Laterality: Right;  Right Below Knee Amputation  . Amputation Right 10/11/2013    Procedure: AMPUTATION BELOW KNEE;  Surgeon: Newt Minion, MD;  Location: [place];  Service: Orthopedics;  Laterality: Right;  Right Below Knee Amputation Revision    Family History: Family History  Problem Relation Age of Onset  . Coronary artery disease Other   . Diabetes Mother     Social History:   reports that he has never smoked. He has never used smokeless tobacco. He reports that he does not drink alcohol or use illicit drugs.  Allergies:  No Known Allergies   Medications:                                                                                                                         Current facility-administered medications:  .  0.9 %  sodium chloride infusion, , Intravenous, Continuous, Juandedios Dudash Fuller Mandril, MD, Last Rate: 100 mL/hr at 12/27/15 1440 .  acetaminophen (TYLENOL) tablet 500 mg, 500 mg, Oral, Q6H PRN, Reubin [name], MD .  ALPRAZolam Duanne Moron) tablet 0.25 mg, 0.25 mg, Oral, TID PRN, Reubin [name],  MD, 0.25 mg at 12/27/15 0252 .  aspirin EC tablet 81 mg, 81 mg, Oral, Daily, Reubin [name], MD, 81 mg at 12/27/15 0831 .  atorvastatin (LIPITOR) tablet 10 mg,  10 mg, Oral, q1800, Reubin [name], MD, 10 mg at 12/27/15 1728 .  calcium acetate (PHOSLO) capsule 2,001 mg, 2,001 mg, Oral, TID WC, Reubin [name], MD, 2,001 mg at 12/27/15 1728 .  cinacalcet (SENSIPAR) tablet 30 mg, 30 mg, Oral, QPM, Reubin [name], MD, 30 mg at 12/27/15 1728 .  [DISCONTINUED] diltiazem (CARDIZEM) 1 mg/mL load via infusion 10 mg, 10 mg, Intravenous, Once **AND** diltiazem (CARDIZEM) 100 mg in dextrose 5 % 100 mL (1 mg/mL) infusion, 5-15 mg/hr, Intravenous, Continuous, Nona Dell, PA-C, Stopped at 12/27/15 1029 .  heparin ADULT infusion 100 units/mL (25000 units/221mL sodium chloride 0.45%), 800 Units/hr, Intravenous, Continuous, Modena Jansky, MD .  hydrocerin (EUCERIN) cream 1 application, 1 application, Topical, PRN, Reubin [name], MD .  hydrOXYzine (ATARAX/VISTARIL) tablet 25 mg, 25 mg, Oral, Q6H PRN, Reubin [name], MD .  insulin aspart (novoLOG) injection 0-15 Units, 0-15 Units, Subcutaneous, TID WC, Hewitt Shorts Harduk, PA-C, 2 Units at 12/27/15 1727 .  insulin aspart (novoLOG) injection 0-5 Units, 0-5 Units, Subcutaneous, QHS, Laura A Harduk, PA-C .  latanoprost (XALATAN) 0.005 % ophthalmic solution 1 drop, 1 drop, Both Eyes, QHS, Reubin [name], MD, 1 drop at 12/27/15 0334 .  metoprolol tartrate (LOPRESSOR) tablet 12.5 mg, 12.5 mg, Oral, QID, Dorris Carnes V, MD .  ondansetron [facility]) tablet 4 mg, 4 mg, Oral, Q6H PRN **OR** ondansetron (ZOFRAN) injection 4 mg, 4 mg, Intravenous, Q6H PRN, Reubin [name], MD .  pantoprazole (PROTONIX) EC tablet 40 mg, 40 mg, Oral, Daily, Reubin [name], MD, 40 mg at 12/27/15 0830 .  Warfarin - Pharmacist Dosing Inpatient, , Does not apply, q1800, Jake Church Masters, RPH   ROS:                                                                                                                                       History obtained from the patient  General ROS: negative for - chills, fatigue, fever, night  sweats, weight gain or weight loss Psychological ROS: negative for - behavioral disorder, hallucinations, memory difficulties, mood swings or suicidal ideation Ophthalmic ROS: negative for - blurry vision, double vision, eye pain or loss of vision ENT ROS: negative for - epistaxis, nasal discharge, oral lesions, sore throat, tinnitus or vertigo Allergy and Immunology ROS: negative for - hives or itchy/watery eyes Hematological and Lymphatic ROS: negative for - bleeding problems, bruising or swollen lymph nodes Endocrine ROS: negative for - galactorrhea, hair pattern changes, polydipsia/polyuria or temperature intolerance Respiratory ROS: negative for - cough, hemoptysis, shortness of breath or wheezing Cardiovascular ROS: negative for - chest pain, dyspnea on exertion, edema or irregular heartbeat Gastrointestinal ROS: negative for - abdominal pain, diarrhea,  hematemesis, nausea/vomiting or stool incontinence Genito-Urinary ROS: negative for - dysuria, hematuria, incontinence or urinary frequency/urgency Musculoskeletal ROS: negative for - joint swelling or muscular weakness in both arms Neurological ROS: as noted in HPI Dermatological ROS: negative for rash and skin lesion changes  Neurologic Examination:                                                                                                    Today's Vitals   12/27/15 1420 12/27/15 1520 12/27/15 1600 12/27/15 1800  BP: 96/71 101/82 99/70 106/75  Pulse: 83 85 93 39  Temp:  99.2 F (37.3 C)    TempSrc:  Oral    Resp: 17 16 15 15   Height:      Weight:      SpO2: 100% 100% 100% 100%  PainSc:        Evaluation of higher integrative functions including: Level of alertness: Alert,  Oriented to time, place and person Speech: fluent, no evidence of dysarthria or aphasia noted.  Test the following cranial nerves: 2-12 grossly intact Motor examination: Normal tone, bulk, full 5/5 motor strength in Bilateral upper extremities and  in bilateral proximal lower extremities, status post bilateral BKA  Test coordination: Normal finger nose testing, with no evidence of upper limb appendicular ataxia or abnormal involuntary movements or tremors noted.    Lab Results: Basic Metabolic Panel:  Recent Labs Lab 12/26/15 2050 12/27/15 0141  NA 134*  --   K 4.1  --   CL 95*  --   CO2 25  --   GLUCOSE 147*  --   BUN 32*  --   CREATININE 6.67*  --   CALCIUM 9.1  --   MG  --  2.2    Liver Function Tests: No results for input(s): AST, ALT, ALKPHOS, BILITOT, PROT, ALBUMIN in the last 168 hours. No results for input(s): LIPASE, AMYLASE in the last 168 hours. No results for input(s): AMMONIA in the last 168 hours.  CBC:  Recent Labs Lab 12/26/15 2050 12/27/15 0141  WBC 6.4 7.8  HGB 12.8* 13.4  HCT 39.6 41.9  MCV 87.8 90.5  PLT 239 236    Cardiac Enzymes:  Recent Labs Lab 12/26/15 2136 12/27/15 0141 12/27/15 0905 12/27/15 1451  TROPONINI 0.05* 0.05* 0.05* 0.05*    Lipid Panel: No results for input(s): CHOL, TRIG, HDL, CHOLHDL, VLDL, LDLCALC in the last 168 hours.  CBG:  Recent Labs Lab 12/26/15 2118 12/27/15 0747 12/27/15 1151 12/27/15 1342 12/27/15 1634  GLUCAP 150* 137* 126* 168* 122*    Microbiology: Results for orders placed or performed during the hospital encounter of 12/26/15  MRSA PCR Screening     Status: None   Collection Time: 12/27/15  1:45 AM  Result Value Ref Range Status   MRSA by PCR NEGATIVE NEGATIVE Final    Comment:        The GeneXpert MRSA Assay (FDA approved for NASAL specimens only), is one component of a comprehensive MRSA colonization surveillance program. It is not intended to diagnose MRSA infection nor to guide or monitor treatment  for MRSA infections.      Imaging: Dg Chest 2 View  12/26/2015  CLINICAL DATA:  Acute onset of generalized weakness and vomiting. Initial encounter. EXAM: CHEST  2 VIEW COMPARISON:  Chest radiograph performed 10/22/2013  FINDINGS: The lungs are well-aerated. Mild vascular congestion is noted. There is no evidence of focal opacification, pleural effusion or pneumothorax. The heart is normal in size; the mediastinal contour is within normal limits. No acute osseous abnormalities are seen. A vascular stent is noted at the left arm. IMPRESSION: Mild vascular congestion noted.  Lungs remain grossly clear. Electronically Signed   By: Garald Balding M.D.   On: 12/26/2015 22:29   Ct Head Wo Contrast  12/27/2015  CLINICAL DATA:  Left eye droop and hand weakness EXAM: CT HEAD WITHOUT CONTRAST TECHNIQUE: Contiguous axial images were obtained from the base of the skull through the vertex without intravenous contrast. COMPARISON:  None. FINDINGS: Brain: No evidence of acute infarction, hemorrhage, extra-axial collection, ventriculomegaly, or mass effect. Vascular: No hyperdense vessel or unexpected calcification. Skull: Negative for fracture or focal lesion. Sinuses/Orbits: No acute findings. Other: Mild white matter changes. IMPRESSION: 1. No acute abnormalities identified to explain the patient's symptoms. No bleed or acute ischemia is seen. Findings called to Dr. Silverio Decamp at 2:18 p.m. Electronically Signed   By: Dorise Bullion III M.D   On: 12/27/2015 14:21     Assessment and plan:  Oscar Castillo is an 65 y.o. male patient  withmedical history significant of ESRD on hemodialysis, type 2 diabetes, hypertension, depression, GERD, history of hepatitis C, anemia, BPH, chronic combined diastolic and systolic CHF with last ejection fraction measured at 45%, admitted with new-onset atrial fibrillation, currently on Coumadin, with heparin bridge.   During routine care, nurse noticed a new onset of confusion, trouble answering questions appropriately and some slurred speech symptoms. Due to suspicion for an acute stroke given his risk factors, his stroke code was called by the nurse. The time of my evaluation, patient was able to answer  questions appropriately. He is not sure about this incident, denies any new neurological symptoms. His neurological examination is grossly nonfocal, except for status post BKA bilaterally. A stat CT of the head was done as part of the stroke code protocol, which did not show evidence of any acute pathology.  His blood pressure is noted to be in systolic 0000000. Recommend starting IV fluid boluses to keep his blood pressure at least around 110. There is a possibility of transient hypotension causing the symptoms. Recommend further neurodiagnostic workup with MRI of the brain to rule out a small subclinical acute infarct. Echocardiogram ordered by cardiologist. He is on Coumadin without heparin bridge for new-onset atrial fibrillation.   MRI of the brain completed at the time of finalizing this note, did not show any acute stroke. No further recommendations from neurology at this time.   Please call if any further questions

## 2015-12-27 NOTE — H&P (Signed)
History and Physical    Oscar Castillo [id number] DOB: [birth year] DOA: 12/26/2015  PCP: [name], MD  Patient coming from: Home.  Chief Complaint: Fatigue.  HPI: Oscar Castillo is a 65 y.o. male with medical history significant of ESRD on hemodialysis, type 2 diabetes, hypertension, depression, GERD, history of hepatitis C, anemia, BPH, chronic combined diastolic and systolic CHF with last ejection fraction measured at 45% who comes to the emergency department with complaints of feeling very tired and fatigued after he came home from hemodialysis. His heart rate was in the 120s when he arrived to the emergency department. He denies chest pain, palpitations, dyspnea, diaphoresis, dizziness or orthopnea.  ED Course: Workup in the emergency department showed a mildly elevated troponin level, EKG showing atrial fibrillation with RVR, mild vascular congestion on chest x-ray.  Review of Systems: As per HPI otherwise 10 point review of systems negative.    Past Medical History  Diagnosis Date  . ESRD on hemodialysis ([place]) 05/05/2007    ESRD due to DM/HTN. Started dialysis in November 2013.  HD TTS at [facility] on [address].  [name] BACK PAIN, LUMBAR, CHRONIC 08/06/2009  . BENIGN PROSTATIC HYPERTROPHY 08/01/2010  . CEREBROVASCULAR ACCIDENT, HX OF 08/06/2009  . CHOLELITHIASIS 08/01/2010  . CONGESTIVE HEART FAILURE 03/18/2009  . DEPRESSION 03/18/2009  . DIABETES MELLITUS, TYPE II 03/25/2007  . ERECTILE DYSFUNCTION 03/25/2007  . GERD 03/25/2007  . HEPATITIS C, HX OF 03/25/2007  . HYPERTENSION 03/25/2007  . Morbid obesity ([place]) 03/25/2007  . NEPHROLITHIASIS, HX OF 03/18/2009  . Complication of anesthesia     wife states pt had trouble waking up with his last surgery in Nov., 2014  . History of Clostridium difficile   . Hemorrhoids   . Anemia   . Antral ulcer 2014    small    Past Surgical History  Procedure Laterality Date  . Nephrectomy      partial RR  . Av fistula placement  06/14/2012   Procedure: ARTERIOVENOUS (AV) FISTULA CREATION;  Surgeon: Angelia Mould, MD;  Location: [facility] OR;  Service: Vascular;  Laterality: Left;  Left basilic vein transposition with fistula.  . Tibia im nail insertion Left 09/09/2012    Procedure: INTRAMEDULLARY (IM) NAIL TIBIAL;  Surgeon: Johnny Bridge, MD;  Location: [place];  Service: Orthopedics;  Laterality: Left;  left tibial nail and open reduction internal fixation left fibula fracture  . Orif fibula fracture Left 09/09/2012    Procedure: OPEN REDUCTION INTERNAL FIXATION (ORIF) FIBULA FRACTURE;  Surgeon: Johnny Bridge, MD;  Location: [place];  Service: Orthopedics;  Laterality: Left;  . Colonoscopy N/A 10/28/2012    Procedure: COLONOSCOPY;  Surgeon: Jeryl Columbia, MD;  Location: [facility] ENDOSCOPY;  Service: Endoscopy;  Laterality: N/A;  . Esophagogastroduodenoscopy N/A 11/02/2012    Procedure: ESOPHAGOGASTRODUODENOSCOPY (EGD);  Surgeon: Cleotis Nipper, MD;  Location: [facility] ENDOSCOPY;  Service: Endoscopy;  Laterality: N/A;  . Colonoscopy N/A 11/02/2012    Procedure: COLONOSCOPY;  Surgeon: Cleotis Nipper, MD;  Location: [facility] ENDOSCOPY;  Service: Endoscopy;  Laterality: N/A;  . Colonoscopy N/A 11/03/2012    Procedure: COLONOSCOPY;  Surgeon: Cleotis Nipper, MD;  Location: [facility] ENDOSCOPY;  Service: Endoscopy;  Laterality: N/A;  . Givens capsule study N/A 11/04/2012    Procedure: GIVENS CAPSULE STUDY;  Surgeon: Cleotis Nipper, MD;  Location: [facility] ENDOSCOPY;  Service: Endoscopy;  Laterality: N/A;  . Enteroscopy N/A 11/08/2012    Procedure: ENTEROSCOPY;  Surgeon: Wonda Horner, MD;  Location: [facility] ENDOSCOPY;  Service: Endoscopy;  Laterality: N/A;  . Amputation Left 05/12/2013    Procedure: AMPUTATION RAY;  Surgeon: Newt Minion, MD;  Location: [place];  Service: Orthopedics;  Laterality: Left;  Left Foot 1st Ray Amputation  . Eye surgery Left     to remove scar tissue  . Amputation Left 06/09/2013    Procedure: AMPUTATION BELOW KNEE;  Surgeon: Newt Minion, MD;   Location: [place];  Service: Orthopedics;  Laterality: Left;  Left Below Knee Amputation and removal proximal screws IM tibial nail  . Hardware removal Left 06/09/2013    Procedure: HARDWARE REMOVAL;  Surgeon: Newt Minion, MD;  Location: [place];  Service: Orthopedics;  Laterality: Left;  Left Below Knee Amputation  and Removal proximal screws IM tibial nail  . Amputation Right 09/08/2013    Procedure: AMPUTATION BELOW KNEE;  Surgeon: Newt Minion, MD;  Location: [place];  Service: Orthopedics;  Laterality: Right;  Right Below Knee Amputation  . Amputation Right 10/11/2013    Procedure: AMPUTATION BELOW KNEE;  Surgeon: Newt Minion, MD;  Location: [place];  Service: Orthopedics;  Laterality: Right;  Right Below Knee Amputation Revision     reports that he has never smoked. He has never used smokeless tobacco. He reports that he does not drink alcohol or use illicit drugs.  No Known Allergies  Family History  Problem Relation Age of Onset  . Coronary artery disease Other   . Diabetes Mother     Prior to Admission medications   Medication Sig Start Date End Date Taking? Authorizing Provider  acetaminophen (TYLENOL) 500 MG tablet Take 500 mg by mouth every 6 (six) hours as needed for mild pain.   Yes Historical Provider, MD  amLODipine (NORVASC) 10 MG tablet TAKE 1 TABLET BY MOUTH DAILY Patient taking differently: TAKE 1 TABLET BY MOUTH every evening 11/04/15  Yes [name], MD  aspirin EC 81 MG tablet Take 81 mg by mouth daily.   Yes Historical Provider, MD  atorvastatin (LIPITOR) 10 MG tablet Take 1 tablet (10 mg total) by mouth daily. Patient taking differently: Take 10 mg by mouth daily at 6 PM.  12/13/15  Yes [name], MD  calcium acetate (PHOSLO) 667 MG capsule Take 2 capsules (1,334 mg total) by mouth 3 (three) times daily with meals. Patient taking differently: Take 2,001 mg by mouth 3 (three) times daily with meals.  06/21/12  Yes Sorin June Leap, MD  hydrALAZINE (APRESOLINE) 100 MG  tablet Take 1 tablet (100 mg total) by mouth every 8 (eight) hours. 09/10/14  Yes [name], MD  hydrOXYzine (ATARAX/VISTARIL) 25 MG tablet Take 1 tablet (25 mg total) by mouth every 6 (six) hours as needed. Patient taking differently: Take 25 mg by mouth every 6 (six) hours as needed for itching.  11/18/15  Yes [name], MD  ketoconazole (NIZORAL) 2 % cream Apply 1 application topically daily. 11/13/15  Yes [name], MD  labetalol (NORMODYNE) 300 MG tablet Take 1 tablet (300 mg total) by mouth 3 (three) times daily. Patient taking differently: Take 300 mg by mouth 2 (two) times daily.  07/24/14  Yes [name], MD  latanoprost (XALATAN) 0.005 % ophthalmic solution place 1 drop into right eye at bedtime 09/13/15  Yes Historical Provider, MD  lidocaine-prilocaine (EMLA) cream APPLY A SMALL AMOUNT TO SKIN AT THE ACCESS SITE (AVF) AS DIRECTED BEFORE EACH DIALYSIS SESSION THREE DAYS A WEEK 06/03/15  Yes Historical Provider, MD  PREVIDENT 5000 PLUS 1.1 % CREA dental cream Place 1 application onto teeth every morning.  04/24/15  Yes Historical Provider, MD  SENSIPAR 30 MG tablet Take 30 mg by mouth every evening.  01/09/15  Yes Historical Provider, MD  silver sulfADIAZINE (SILVADENE) 1 % cream Apply 1 application topically daily.   Yes Historical Provider, MD  Skin Protectants, Misc. (EUCERIN) cream Apply 1 application topically as needed for dry skin.   Yes Historical Provider, MD  VOLTAREN 1 % GEL APPLY 2 GRAMS TO HANDS BID TO TID 09/19/15  Yes Historical Provider, MD    Physical Exam: Filed Vitals:   12/27/15 0015 12/27/15 0030 12/27/15 0045 12/27/15 0132  BP: 109/70 102/72 102/72 118/63  Pulse: 116 50 73 55  Temp:    98.2 F (36.8 C)  TempSrc:    Oral  Resp: 15 14 15 22   Height:    5\' 10"  (1.778 m)  Weight:    81.058 kg (178 lb 11.2 oz)  SpO2: 100% 100% 100% 100%      Constitutional: NAD, calm, comfortable Filed Vitals:   12/27/15 0015 12/27/15 0030 12/27/15 0045 12/27/15 0132    BP: 109/70 102/72 102/72 118/63  Pulse: 116 50 73 55  Temp:    98.2 F (36.8 C)  TempSrc:    Oral  Resp: 15 14 15 22   Height:    5\' 10"  (1.778 m)  Weight:    81.058 kg (178 lb 11.2 oz)  SpO2: 100% 100% 100% 100%   Eyes: PERRL, lids and conjunctivae normal ENMT: Mucous membranes are moist. Posterior pharynx clear of any exudate or lesions. Neck: normal, supple, no masses, no thyromegaly Respiratory: clear to auscultation bilaterally, no wheezing, no crackles. Normal respiratory effort. No accessory muscle use.  Cardiovascular: Irregularly irregular, tachycardic, no murmurs / rubs / gallops. No extremity edema. No carotid bruits.  Abdomen: no tenderness, no masses palpated. No hepatosplenomegaly. Bowel sounds positive.  Musculoskeletal: Bilateral BKA Skin: no rashes, lesions, ulcers. No induration Neurologic: CN 2-12 grossly intact. Sensation intact. Strength 5/5 in upper extremity.  Psychiatric: Normal judgment and insight. Alert and oriented x 4. Normal mood.    Labs on Admission: I have personally reviewed following labs and imaging studies  CBC:  Recent Labs Lab 12/26/15 2050 12/27/15 0141  WBC 6.4 7.8  HGB 12.8* 13.4  HCT 39.6 41.9  MCV 87.8 90.5  PLT 239 [id number]   Basic Metabolic Panel:  Recent Labs Lab 12/26/15 2050 12/27/15 0141  NA 134*  --   K 4.1  --   CL 95*  --   CO2 25  --   GLUCOSE 147*  --   BUN 32*  --   CREATININE 6.67*  --   CALCIUM 9.1  --   MG  --  2.2   GFR: Estimated Creatinine Clearance: 11.6 mL/min (by C-G formula based on Cr of 6.67).  Coagulation Profile:  Recent Labs Lab 12/26/15 2150 12/27/15 0141  INR 1.19 1.10   Cardiac Enzymes:  Recent Labs Lab 12/26/15 2136 12/27/15 0141  TROPONINI 0.05* 0.05*   CBG:  Recent Labs Lab 12/26/15 2118  GLUCAP 150*   Urine analysis:    Component Value Date/Time   COLORURINE YELLOW 05/15/2013 0007   APPEARANCEUR CLEAR 05/15/2013 0007   LABSPEC 1.014 05/15/2013 0007   PHURINE  8.5* 05/15/2013 0007   GLUCOSEU 250* 05/15/2013 0007   GLUCOSEU 500 (?) 11/06/2009 0948   HGBUR NEGATIVE 05/15/2013 0007   BILIRUBINUR NEGATIVE  05/15/2013 0007   KETONESUR NEGATIVE 05/15/2013 0007   PROTEINUR >300* 05/15/2013 0007   UROBILINOGEN 0.2 05/15/2013 0007   NITRITE NEGATIVE 05/15/2013 0007   LEUKOCYTESUR NEGATIVE 05/15/2013 0007    Radiological Exams on Admission: Dg Chest 2 View  12/26/2015  CLINICAL DATA:  Acute onset of generalized weakness and vomiting. Initial encounter. EXAM: CHEST  2 VIEW COMPARISON:  Chest radiograph performed 10/22/2013 FINDINGS: The lungs are well-aerated. Mild vascular congestion is noted. There is no evidence of focal opacification, pleural effusion or pneumothorax. The heart is normal in size; the mediastinal contour is within normal limits. No acute osseous abnormalities are seen. A vascular stent is noted at the left arm. IMPRESSION: Mild vascular congestion noted.  Lungs remain grossly clear. Electronically Signed   By: Garald Balding M.D.   On: 12/26/2015 22:29    EKG: Independently reviewed. Vent. rate 123 BPM PR interval * ms QRS duration 148 ms QT/QTc 398/569 ms P-R-T axes * 221 -4 Atrial fibrillation with rapid ventricular response Right superior axis deviation Left ventricular hypertrophy with QRS widening Inferior infarct , age undetermined Abnormal ECG  Assessment/Plan Principal Problem:   New onset a-fib ([place]) Admit to stepdown/inpatient Continue Cardizem infusion. Continue labetalol 300 mg by mouth daily, increase as blood pressure allows. Hold amlodipine and hydralazine. Trend troponin levels. Check magnesium level and replace if needed. Check echocardiogram in a.m.  Active Problems:   Essential hypertension Continue labetalol 300 mg by mouth twice a day. Hold amlodipine and hydralazine for now to avoid worsening heart rate/hypotension and provide room to increase negative inotropic medications.    GERD Start  Pantoprazole 40 mg by mouth daily    Hyperlipidemia Continue atorvastatin 10 mg by mouth daily    Chronic combined systolic (EF [id number]) and grade 2diastolic congestive heart failure Stable. Check echocardiogram.    ESRD on hemodialysis [facility]) Consult left on HD office voicemail    Glaucoma Continue Xalatan eye drops    DVT prophylaxis: Lovenox. Code Status: Full code. Family Communication: His wife was present in the room. Disposition Plan: Admit for rate control. Consults called:  Admission status: Inpatient/stepdown   Reubin [name] MD [facility] Pager [phone]  If 7PM-7AM, please contact night-coverage [email or web address] Password [email or web address]  12/27/2015, 3:16 AM

## 2015-12-27 NOTE — Consult Note (Signed)
Renal Service Consult Note [facility]  ANGELL GAINOUS 12/27/2015 [place] Requesting Physician:  Dr Algis Liming  Reason for Consult:  ESRD pt w new afib HPI: The patient is a 65 y.o. year-old with hx of HTN, hep C, CVA, Cdif and ESRD on HD TTS at [facility].   Felt tired and came to ED.  In ED heart rate 120's w new afib.  Patient was admitted and started on lovenox / coumadin/ IV diltiazem.   Asked to see for ESRD.    Feeling a little better today.  No CP, SOB or fevers, chills or sweats. NO abd pain.  Has had diarrhea for 2 weeks, hx of Cdif, and has had No.  Chron itching and cramps w HD.  Missed taking his BP meds for a " while".  Hx DM but off of medication.  Bilat BKA, gets around in [facility], some difficulty w transfers.  LIves at home w his wife.  Has 2 daughters in [place] area.  No hx MI/ stent.  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  Grew up [place], [place], played football at [employer].  Liked to hunt and fish before becoming disabled, still fishes a bit.      ROS  denies CP  no joint pain   no HA  no blurry vision  no rash  no dysuria  no difficulty voiding  no change in urine color    Past Medical History  Past Medical History  Diagnosis Date  . ESRD on hemodialysis ([place]) 05/05/2007    ESRD due to DM/HTN. Started dialysis in November 2013.  HD TTS at [facility] on [address].  [name] BACK PAIN, LUMBAR, CHRONIC 08/06/2009  . BENIGN PROSTATIC HYPERTROPHY 08/01/2010  . CEREBROVASCULAR ACCIDENT, HX OF 08/06/2009  . CHOLELITHIASIS 08/01/2010  . CONGESTIVE HEART FAILURE 03/18/2009  . DEPRESSION 03/18/2009  . DIABETES MELLITUS, TYPE II 03/25/2007  . ERECTILE DYSFUNCTION 03/25/2007  . GERD 03/25/2007  . HEPATITIS C, HX OF 03/25/2007  . HYPERTENSION 03/25/2007  . Morbid obesity ([place]) 03/25/2007  . NEPHROLITHIASIS, HX OF 03/18/2009  . Complication of anesthesia     wife states pt had trouble waking up with his last surgery in  Nov., 2014  . History of Clostridium difficile   . Hemorrhoids   . Anemia   . Antral ulcer 2014    small   Past Surgical History  Past Surgical History  Procedure Laterality Date  . Nephrectomy      partial RR  . Av fistula placement  06/14/2012    Procedure: ARTERIOVENOUS (AV) FISTULA CREATION;  Surgeon: Angelia Mould, MD;  Location: [facility] OR;  Service: Vascular;  Laterality: Left;  Left basilic vein transposition with fistula.  . Tibia im nail insertion Left 09/09/2012    Procedure: INTRAMEDULLARY (IM) NAIL TIBIAL;  Surgeon: Johnny Bridge, MD;  Location: [place];  Service: Orthopedics;  Laterality: Left;  left tibial nail and open reduction internal fixation left fibula fracture  . Orif fibula fracture Left 09/09/2012    Procedure: OPEN REDUCTION INTERNAL FIXATION (ORIF) FIBULA FRACTURE;  Surgeon: Johnny Bridge, MD;  Location: [place];  Service: Orthopedics;  Laterality: Left;  . Colonoscopy N/A 10/28/2012    Procedure: COLONOSCOPY;  Surgeon: Jeryl Columbia, MD;  Location: [facility] ENDOSCOPY;  Service: Endoscopy;  Laterality: N/A;  . Esophagogastroduodenoscopy N/A 11/02/2012    Procedure: ESOPHAGOGASTRODUODENOSCOPY (EGD);  Surgeon: Cleotis Nipper, MD;  Location: [facility]  ENDOSCOPY;  Service: Endoscopy;  Laterality: N/A;  . Colonoscopy N/A 11/02/2012    Procedure: COLONOSCOPY;  Surgeon: Cleotis Nipper, MD;  Location: [facility] ENDOSCOPY;  Service: Endoscopy;  Laterality: N/A;  . Colonoscopy N/A 11/03/2012    Procedure: COLONOSCOPY;  Surgeon: Cleotis Nipper, MD;  Location: [facility] ENDOSCOPY;  Service: Endoscopy;  Laterality: N/A;  . Givens capsule study N/A 11/04/2012    Procedure: GIVENS CAPSULE STUDY;  Surgeon: Cleotis Nipper, MD;  Location: [facility] ENDOSCOPY;  Service: Endoscopy;  Laterality: N/A;  . Enteroscopy N/A 11/08/2012    Procedure: ENTEROSCOPY;  Surgeon: Wonda Horner, MD;  Location: [facility] ENDOSCOPY;  Service: Endoscopy;  Laterality: N/A;  . Amputation Left 05/12/2013    Procedure: AMPUTATION RAY;   Surgeon: Newt Minion, MD;  Location: [place];  Service: Orthopedics;  Laterality: Left;  Left Foot 1st Ray Amputation  . Eye surgery Left     to remove scar tissue  . Amputation Left 06/09/2013    Procedure: AMPUTATION BELOW KNEE;  Surgeon: Newt Minion, MD;  Location: [place];  Service: Orthopedics;  Laterality: Left;  Left Below Knee Amputation and removal proximal screws IM tibial nail  . Hardware removal Left 06/09/2013    Procedure: HARDWARE REMOVAL;  Surgeon: Newt Minion, MD;  Location: [place];  Service: Orthopedics;  Laterality: Left;  Left Below Knee Amputation  and Removal proximal screws IM tibial nail  . Amputation Right 09/08/2013    Procedure: AMPUTATION BELOW KNEE;  Surgeon: Newt Minion, MD;  Location: [place];  Service: Orthopedics;  Laterality: Right;  Right Below Knee Amputation  . Amputation Right 10/11/2013    Procedure: AMPUTATION BELOW KNEE;  Surgeon: Newt Minion, MD;  Location: [place];  Service: Orthopedics;  Laterality: Right;  Right Below Knee Amputation Revision   Family History  Family History  Problem Relation Age of Onset  . Coronary artery disease Other   . Diabetes Mother    Social History  reports that he has never smoked. He has never used smokeless tobacco. He reports that he does not drink alcohol or use illicit drugs. Allergies No Known Allergies Home medications Prior to Admission medications   Medication Sig Start Date End Date Taking? Authorizing Provider  acetaminophen (TYLENOL) 500 MG tablet Take 500 mg by mouth every 6 (six) hours as needed for mild pain.   Yes Historical Provider, MD  amLODipine (NORVASC) 10 MG tablet TAKE 1 TABLET BY MOUTH DAILY Patient taking differently: TAKE 1 TABLET BY MOUTH every evening 11/04/15  Yes [name], MD  aspirin EC 81 MG tablet Take 81 mg by mouth daily.   Yes Historical Provider, MD  atorvastatin (LIPITOR) 10 MG tablet Take 1 tablet (10 mg total) by mouth daily. Patient taking differently: Take 10 mg by mouth  daily at 6 PM.  12/13/15  Yes [name], MD  calcium acetate (PHOSLO) 667 MG capsule Take 2 capsules (1,334 mg total) by mouth 3 (three) times daily with meals. Patient taking differently: Take 2,001 mg by mouth 3 (three) times daily with meals.  06/21/12  Yes Sorin June Leap, MD  hydrALAZINE (APRESOLINE) 100 MG tablet Take 1 tablet (100 mg total) by mouth every 8 (eight) hours. 09/10/14  Yes [name], MD  hydrOXYzine (ATARAX/VISTARIL) 25 MG tablet Take 1 tablet (25 mg total) by mouth every 6 (six) hours as needed. Patient taking differently: Take 25 mg by mouth every 6 (six) hours as needed for itching.  11/18/15  Yes [name], MD  ketoconazole (NIZORAL) 2 % cream Apply 1 application topically daily. 11/13/15  Yes [name], MD  labetalol (NORMODYNE) 300 MG tablet Take 1 tablet (300 mg total) by mouth 3 (three) times daily. Patient taking differently: Take 300 mg by mouth 2 (two) times daily.  07/24/14  Yes [name], MD  latanoprost (XALATAN) 0.005 % ophthalmic solution place 1 drop into right eye at bedtime 09/13/15  Yes Historical Provider, MD  lidocaine-prilocaine (EMLA) cream APPLY A SMALL AMOUNT TO SKIN AT THE ACCESS SITE (AVF) AS DIRECTED BEFORE EACH DIALYSIS SESSION THREE DAYS A WEEK 06/03/15  Yes Historical Provider, MD  PREVIDENT 5000 PLUS 1.1 % CREA dental cream Place 1 application onto teeth every morning.  04/24/15  Yes Historical Provider, MD  SENSIPAR 30 MG tablet Take 30 mg by mouth every evening.  01/09/15  Yes Historical Provider, MD  silver sulfADIAZINE (SILVADENE) 1 % cream Apply 1 application topically daily.   Yes Historical Provider, MD  Skin Protectants, Misc. (EUCERIN) cream Apply 1 application topically as needed for dry skin.   Yes Historical Provider, MD  VOLTAREN 1 % GEL APPLY 2 GRAMS TO HANDS BID TO TID 09/19/15  Yes Historical Provider, MD   Liver Function Tests No results for input(s): AST, ALT, ALKPHOS, BILITOT, PROT, ALBUMIN in the last 168 hours. No results  for input(s): LIPASE, AMYLASE in the last 168 hours. CBC  Recent Labs Lab 12/26/15 2050 12/27/15 0141  WBC 6.4 7.8  HGB 12.8* 13.4  HCT 39.6 41.9  MCV 87.8 90.5  PLT 239 [id number]   Basic Metabolic Panel  Recent Labs Lab 12/26/15 2050  NA 134*  K 4.1  CL 95*  CO2 25  GLUCOSE 147*  BUN 32*  CREATININE 6.67*  CALCIUM 9.1    Filed Vitals:   12/27/15 0132 12/27/15 0400 12/27/15 0500 12/27/15 0800  BP: 118/63 110/73 110/73 132/100  Pulse: 55 76 69 158  Temp: 98.2 F (36.8 C)  98.4 F (36.9 C) 98.5 F (36.9 C)  TempSrc: Oral  Oral Oral  Resp: 22 14 17 16   Height: 5\' 10"  (1.778 m)     Weight: 81.058 kg (178 lb 11.2 oz)     SpO2: 100%  100% 100%   Exam Alert, facial droop and dysathria, no distress, calm No rash, cyanosis or gangrene Sclera anicteric, throat clear  No jvd or bruits  Chest clear bilat Cor irreg irreg no mrg Abd soft ntnd no mass or ascites +bs  GU normal male MS bilat BKA, no wounds or edema Neuro is alert, Ox 3   Dialysis: TTS [place]  3.5h  82.5kg  2/2.5 bath  P4  Hep 7000 LUA AVF Hect 5 Ven 50/wk Mirc 50 q 4 last 5/16    Assessment: 1  Afib w RVR - new onset, IV dilt, lovenox/ coumadin 2  Diarrhea , hx Cdif 3  N/V 4  ESRD usual HD TTS, has not missed HD 5  PVD bilat BKA 6  Hx CVA 7  HTN on labetalol, holding hydral/ amlod for lower BP's, also on IV dilt   Plan - HD tomorrow, UF to dry wt. Will follow.    Kelly Splinter MD [employer] pager [phone]    cell [phone] 12/27/2015, 10:55 AM

## 2015-12-27 NOTE — Progress Notes (Signed)
Called to room by Jettie Booze, NP due to pt being confused. RN went to room and assessed pt. Pt was confused and could not answer questions appropriately. Neuro assessment and NIH performed. Code Stroke was called. Rapid response RN and Stroke team RN at bedside. Neurologist and Dr. Algis Liming notified. New orders received. Will continue to monitor.    Ruben Reason, RN

## 2015-12-27 NOTE — Progress Notes (Signed)
  Echocardiogram 2D Echocardiogram has been performed.  Jennette Dubin 12/27/2015, 3:51 PM

## 2015-12-27 NOTE — Consult Note (Signed)
CARDIOLOGY CONSULT NOTE   Patient ID: Oscar Castillo MRN: [id number] DOB/AGE: 1950-10-06 65 y.o.  Admit date: 12/26/2015  Primary Physician   Oscar Cower, MD Primary Cardiologist: New Requesting MD: Oscar Castillo Reason for Consultation: Afib  HPI: Oscar Castillo is a 65 year old male with a past medical history of ESRD on HD, systolic and diastolic CHF, CVA, DM, HTN, and hepatitis C.   He presented to the ED on 12/26/15 with complaints of general malaise after his HD treatment. He was found to be in Afib RVR with a rate of 120. He had some associated nausea and vomiting for 2 days, and diarrhea for a week. Troponin mildly elevated. He was started on IV Cardizem. Cardiology asked to consult for Afib.   Patient does not have a history of Afib, denies palpitations. His last echo was in March of 2015 and showed EF of 55-65%. His chest x ray showed some mild vascular congestion. Echo pending for this admission.  When speaking with patient he is unable to tell me what month it is, he was unsure of who the President is currently. He has a slight left eyelid droop. Primary team notified. STAT head CT ordered.   Past Medical History  Diagnosis Date  . ESRD on hemodialysis ([place]) 05/05/2007    ESRD due to DM/HTN. Started dialysis in November 2013.  HD TTS at [facility] on [address].  [name] BACK PAIN, LUMBAR, CHRONIC 08/06/2009  . BENIGN PROSTATIC HYPERTROPHY 08/01/2010  . CEREBROVASCULAR ACCIDENT, HX OF 08/06/2009  . CHOLELITHIASIS 08/01/2010  . CONGESTIVE HEART FAILURE 03/18/2009  . DEPRESSION 03/18/2009  . DIABETES MELLITUS, TYPE II 03/25/2007  . ERECTILE DYSFUNCTION 03/25/2007  . GERD 03/25/2007  . HEPATITIS C, HX OF 03/25/2007  . HYPERTENSION 03/25/2007  . Morbid obesity ([place]) 03/25/2007  . NEPHROLITHIASIS, HX OF 03/18/2009  . Complication of anesthesia     wife states pt had trouble waking up with his last surgery in Nov., 2014  . History of Clostridium difficile   . Hemorrhoids   . Anemia   . Antral  ulcer 2014    small     Past Surgical History  Procedure Laterality Date  . Nephrectomy      partial RR  . Av fistula placement  06/14/2012    Procedure: ARTERIOVENOUS (AV) FISTULA CREATION;  Surgeon: Angelia Mould, MD;  Location: [facility] OR;  Service: Vascular;  Laterality: Left;  Left basilic vein transposition with fistula.  . Tibia im nail insertion Left 09/09/2012    Procedure: INTRAMEDULLARY (IM) NAIL TIBIAL;  Surgeon: Oscar Bridge, MD;  Location: [place];  Service: Orthopedics;  Laterality: Left;  left tibial nail and open reduction internal fixation left fibula fracture  . Orif fibula fracture Left 09/09/2012    Procedure: OPEN REDUCTION INTERNAL FIXATION (ORIF) FIBULA FRACTURE;  Surgeon: Oscar Bridge, MD;  Location: [place];  Service: Orthopedics;  Laterality: Left;  . Colonoscopy N/A 10/28/2012    Procedure: COLONOSCOPY;  Surgeon: Oscar Columbia, MD;  Location: [facility] ENDOSCOPY;  Service: Endoscopy;  Laterality: N/A;  . Esophagogastroduodenoscopy N/A 11/02/2012    Procedure: ESOPHAGOGASTRODUODENOSCOPY (EGD);  Surgeon: Oscar Nipper, MD;  Location: [facility] ENDOSCOPY;  Service: Endoscopy;  Laterality: N/A;  . Colonoscopy N/A 11/02/2012    Procedure: COLONOSCOPY;  Surgeon: Oscar Nipper, MD;  Location: [facility] ENDOSCOPY;  Service: Endoscopy;  Laterality: N/A;  . Colonoscopy N/A 11/03/2012    Procedure: COLONOSCOPY;  Surgeon: Oscar Nipper, MD;  Location: [facility] ENDOSCOPY;  Service: Endoscopy;  Laterality: N/A;  . Givens capsule study N/A 11/04/2012    Procedure: GIVENS CAPSULE STUDY;  Surgeon: Oscar Nipper, MD;  Location: [facility] ENDOSCOPY;  Service: Endoscopy;  Laterality: N/A;  . Enteroscopy N/A 11/08/2012    Procedure: ENTEROSCOPY;  Surgeon: Oscar Horner, MD;  Location: [facility] ENDOSCOPY;  Service: Endoscopy;  Laterality: N/A;  . Amputation Left 05/12/2013    Procedure: AMPUTATION RAY;  Surgeon: Oscar Minion, MD;  Location: [place];  Service: Orthopedics;  Laterality: Left;  Left Foot 1st Ray  Amputation  . Eye surgery Left     to remove scar tissue  . Amputation Left 06/09/2013    Procedure: AMPUTATION BELOW KNEE;  Surgeon: Oscar Minion, MD;  Location: [place];  Service: Orthopedics;  Laterality: Left;  Left Below Knee Amputation and removal proximal screws IM tibial nail  . Hardware removal Left 06/09/2013    Procedure: HARDWARE REMOVAL;  Surgeon: Oscar Minion, MD;  Location: [place];  Service: Orthopedics;  Laterality: Left;  Left Below Knee Amputation  and Removal proximal screws IM tibial nail  . Amputation Right 09/08/2013    Procedure: AMPUTATION BELOW KNEE;  Surgeon: Oscar Minion, MD;  Location: [place];  Service: Orthopedics;  Laterality: Right;  Right Below Knee Amputation  . Amputation Right 10/11/2013    Procedure: AMPUTATION BELOW KNEE;  Surgeon: Oscar Minion, MD;  Location: [place];  Service: Orthopedics;  Laterality: Right;  Right Below Knee Amputation Revision    No Known Allergies  I have reviewed the patient's current medications . aspirin EC  81 mg Oral Daily  . atorvastatin  10 mg Oral q1800  . calcium acetate  2,001 mg Oral TID WC  . cinacalcet  30 mg Oral QPM  . insulin aspart  0-15 Units Subcutaneous TID WC  . insulin aspart  0-5 Units Subcutaneous QHS  . labetalol  300 mg Oral BID  . latanoprost  1 drop Both Eyes QHS  . pantoprazole  40 mg Oral Daily  . warfarin  5 mg Oral ONCE-1800  . Warfarin - Pharmacist Dosing Inpatient   Does not apply q1800   . diltiazem (CARDIZEM) infusion Stopped (12/27/15 1029)  . heparin     acetaminophen, ALPRAZolam, hydrocerin, hydrOXYzine, ondansetron **OR** ondansetron (ZOFRAN) IV  Prior to Admission medications   Medication Sig Start Date End Date Taking? Authorizing Provider  acetaminophen (TYLENOL) 500 MG tablet Take 500 mg by mouth every 6 (six) hours as needed for mild pain.   Yes Historical Provider, MD  amLODipine (NORVASC) 10 MG tablet TAKE 1 TABLET BY MOUTH DAILY Patient taking differently: TAKE 1 TABLET BY  MOUTH every evening 11/04/15  Yes [name], MD  aspirin EC 81 MG tablet Take 81 mg by mouth daily.   Yes Historical Provider, MD  atorvastatin (LIPITOR) 10 MG tablet Take 1 tablet (10 mg total) by mouth daily. Patient taking differently: Take 10 mg by mouth daily at 6 PM.  12/13/15  Yes [name], MD  calcium acetate (PHOSLO) 667 MG capsule Take 2 capsules (1,334 mg total) by mouth 3 (three) times daily with meals. Patient taking differently: Take 2,001 mg by mouth 3 (three) times daily with meals.  06/21/12  Yes Sorin June Leap, MD  hydrALAZINE (APRESOLINE) 100 MG tablet Take 1 tablet (100 mg total) by mouth every 8 (eight) hours. 09/10/14  Yes [name], MD  hydrOXYzine (ATARAX/VISTARIL) 25 MG tablet Take 1 tablet (  25 mg total) by mouth every 6 (six) hours as needed. Patient taking differently: Take 25 mg by mouth every 6 (six) hours as needed for itching.  11/18/15  Yes [name], MD  ketoconazole (NIZORAL) 2 % cream Apply 1 application topically daily. 11/13/15  Yes [name], MD  labetalol (NORMODYNE) 300 MG tablet Take 1 tablet (300 mg total) by mouth 3 (three) times daily. Patient taking differently: Take 300 mg by mouth 2 (two) times daily.  07/24/14  Yes [name], MD  latanoprost (XALATAN) 0.005 % ophthalmic solution place 1 drop into right eye at bedtime 09/13/15  Yes Historical Provider, MD  lidocaine-prilocaine (EMLA) cream APPLY A SMALL AMOUNT TO SKIN AT THE ACCESS SITE (AVF) AS DIRECTED BEFORE EACH DIALYSIS SESSION THREE DAYS A WEEK 06/03/15  Yes Historical Provider, MD  PREVIDENT 5000 PLUS 1.1 % CREA dental cream Place 1 application onto teeth every morning.  04/24/15  Yes Historical Provider, MD  SENSIPAR 30 MG tablet Take 30 mg by mouth every evening.  01/09/15  Yes Historical Provider, MD  silver sulfADIAZINE (SILVADENE) 1 % cream Apply 1 application topically daily.   Yes Historical Provider, MD  Skin Protectants, Misc. (EUCERIN) cream Apply 1 application topically as  needed for dry skin.   Yes Historical Provider, MD  VOLTAREN 1 % GEL APPLY 2 GRAMS TO HANDS BID TO TID 09/19/15  Yes Historical Provider, MD     Social History   Social History  . Marital Status: Married    Spouse Name: N/A  . Number of Children: 2  . Years of Education: 16   Occupational History  . disabled due to stroke    Social History Main Topics  . Smoking status: Never Smoker   . Smokeless tobacco: Never Used  . Alcohol Use: No  . Drug Use: No  . Sexual Activity: Yes    Birth Control/ Protection: None   Other Topics Concern  . 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 Mother Deceased   . Father Deceased   . Sister Alive   . Brother Alive   . Daughter Alive   . Sister Alive   . Sister Alive   . Daughter Alive    Family History  Problem Relation Age of Onset  . Coronary artery disease Other   . Diabetes Mother      ROS:  Full 14 point review of systems complete and found to be negative unless listed above.  Physical Exam: Blood pressure 93/69, pulse 75, temperature 99.2 F (37.3 C), temperature source Oral, resp. rate 16, height 5\' 10"  (1.778 m), weight 178 lb 11.2 oz (81.058 kg), SpO2 99 %.  General: Well developed, well nourished, male in no acute distress Head: Eyes PERRLA, No xanthomas. Normocephalic and atraumatic, oropharynx without edema or exudate.  Lungs: CTA Heart: Heart irregular rate and rhythm with S1, S2.  No murmur. Radial pulses +2.    Neck: No carotid bruits. No lymphadenopathy.  No JVD. Abdomen: Bowel sounds present, abdomen soft and non-tender without masses or hernias noted. Msk:  No spine or cva tenderness. Bilateral BKA.  Extremities: No clubbing or cyanosis. No edema.  Neuro: Speech delayed, left eyelid droop. Unsure of own birthday, current month.  Psych:  Good affect, responds appropriately Skin: No  rashes or lesions noted.  Labs:   Lab Results  Component Value Date   WBC 7.8 12/27/2015   HGB 13.4 12/27/2015   HCT 41.9 12/27/2015   MCV 90.5 12/27/2015   PLT 236 12/27/2015    Recent Labs  12/27/15 0141  INR 1.10    Recent Labs Lab 12/26/15 2050  NA 134*  K 4.1  CL 95*  CO2 25  BUN 32*  CREATININE 6.67*  CALCIUM 9.1  GLUCOSE 147*   MAGNESIUM  Date Value Ref Range Status  12/27/2015 2.2 1.7 - 2.4 mg/dL Final    Recent Labs  12/26/15 2136 12/27/15 0141 12/27/15 0905  TROPONINI 0.05* 0.05* 0.05*    Lab Results  Component Value Date   CHOL 158 12/11/2015   HDL 29.70* 12/11/2015   LDLCALC 111* 12/11/2015   TRIG 90.0 12/11/2015    Echo: pending  ECG:  Afib rate 120  Radiology:  Dg Chest 2 View  12/26/2015  CLINICAL DATA:  Acute onset of generalized weakness and vomiting. Initial encounter. EXAM: CHEST  2 VIEW COMPARISON:  Chest radiograph performed 10/22/2013 FINDINGS: The lungs are well-aerated. Mild vascular congestion is noted. There is no evidence of focal opacification, pleural effusion or pneumothorax. The heart is normal in size; the mediastinal contour is within normal limits. No acute osseous abnormalities are seen. A vascular stent is noted at the left arm. IMPRESSION: Mild vascular congestion noted.  Lungs remain grossly clear. Electronically Signed   By: Garald Balding M.D.   On: 12/26/2015 22:29    ASSESSMENT AND PLAN:    Principal Problem:   New onset a-fib ([place]) Active Problems:   Essential hypertension   GERD   Hyperlipidemia   Chronic combined systolic (EF [id number]) and grade 2diastolic congestive heart failure   ESRD on hemodialysis ([place])   Glaucoma   Patient is newly confused with delayed responses. When I asked him where we are he replied the hospital, when asked what month we are in he said "March", then "January". I asked him who the President was, he did not answer. Notified nursing. Repeated questions to him, he did not know his  birth month, did not know what month we are in. Correctly identified the President the second time, but still with delayed speech. Code stroke called.    1. New onset Afib: Patient presented with Afib RVR with rate of 120. Started on diltiazem drip, this has since been discontinued as rate is now controlled in 70's.  This patients CHA2DS2-VASc Score and unadjusted Ischemic Stroke Rate (% per year) is equal to 3.2 % stroke rate/year from a score of 3 Above score calculated as 1 point each if present [CHF, HTN, DM, Vascular=MI/PAD/Aortic Plaque, Age if 65-74, or Male], 2 points each if present [Age > 75, or Stroke/TIA/TE]   Received Lovenox (last dose 0200 on 12/27/15),  Switching to heparin today  Neuro to see pt  For long term coumadin  2.  LBBB  New  (EKG yesterday, ? Abnormal lead placement) Echo pending  Furhter eval depends on stroke eval and echo findings  Should have some ischemic eval  Pt denies CP    3  Neuro  Pt with subtle changes  Code stroke called  MRI pending  CT negatvie   4 HTN: Currently has soft BP with SBP in 90's. Home amlodipine and hydralazine held.  5. HLD: LDL is 111, on low intensity statin. Increase atorvastatin to 40mg .with hixtory of DM  Goal around 70    4. Chronic combined systolic and  diastolic CHF: Echo pending this admission. EF 55-60% in 2015. Volume managed per HD.   Appears OK    5. Elevated troponin: with flat trend  May be due to subendo ischemia in setting of Afib with RVR   Denies chest pain.   Signed: Arbutus Leas, NP 12/27/2015 12:33 PM Pager [phone]  Pt seen and examined  Agree with findings as noted above by E Smith Pt with atrial fibriallation with RVR  New.  Rates better  He has received lovenox, being switched to heparin ON exam:  Lungs Clear  Cardiac Irreg Irreg  No murmurs  Ext without edema Neuro:  See above by Christain Sacramento. CT done  MR pending   I have amended note above to reflect my findings   Diltiazem drip is currently off  He  got labetalol earlier  I would stop this and use low dose lopressor as BP allow  Titrate Will continue to follow    Dorris Carnes

## 2015-12-27 NOTE — Progress Notes (Signed)
PROGRESS NOTE  Oscar Castillo  [name] DOB: June 29, 1951  DOA: 12/26/2015 PCP: [name], MD   Brief Narrative:  65 y.o. male with medical history significant of ESRD on TTS hemodialysis, type 2 diabetes, hypertension, depression, GERD, history of hepatitis C, anemia, BPH, chronic combined diastolic and systolic CHF with last ejection fraction measured at 45%, b/l BKA who comes to the emergency department with complaints of feeling very tired and fatigued after he came home from hemodialysis. Workup in the emergency department showed a mildly elevated troponin level, EKG showing atrial fibrillation with RVR, mild vascular congestion on chest x-ray.  Assessment & Plan:   Principal Problem:   New onset a-fib ([place]) Active Problems:   Essential hypertension   GERD   Hyperlipidemia   Chronic combined systolic (EF [id number]) and grade 2diastolic congestive heart failure   ESRD on hemodialysis ([place])   Glaucoma   New onset A. fib with RVR - Admitted to stepdown unit. Initiated on diltiazem drip and continued on home dose of labetalol 300 MG twice a day. Ventricular rate controlled in the 70s. DC diltiazem drip and monitor. - CHA2-DS2-VASC Score: At least 3. Started on Lovenox bridging and Coumadin per pharmacy. - Check 2-D echo and TSH. - Cardiology consulted.  Essential hypertension - Mildly uncontrolled. Continued on labetalol. Amlodipine and hydralazine held but may consider resuming if blood pressures start to persistently increased.  GERD - PPI  Hyperlipidemia - Continue atorvastatin.  Chronic combined systolic and diastolic CHF - Repeat 2-D echo. Volume management across HD. Seems compensated. However chest x-ray suggests mild pulmonary congestion.  ESRD on TTS HD - Nephrology consulted for dialysis needs.  Type II DM/IDDM -Continue SSI. Good inpatient control.  Elevated troponin - Minimal and flat trend. Likely secondary to ESRD and demand ischemia from RVR. Cardiology input  pending.   S/P b/l BKA.    DVT prophylaxis: Currently on Lovenox bridging and Coumadin. Change Lovenox to IV heparin. Code Status: Full Family Communication: Discussed with patient and spouse at bedside.  Disposition Plan: DC home when medically stable.   Consultants:   Cardiology  Nephrology  Procedures:   None  Antimicrobials:   None    Subjective: Denies complaints. No chest pain, dyspnea or palpitations reported. Denies history of irregular heartbeat or A. fib diagnosis in the past. No history of bleeding reported.  Objective:  Filed Vitals:   12/27/15 0132 12/27/15 0400 12/27/15 0500 12/27/15 0800  BP: 118/63 110/73 110/73 132/100  Pulse: 55 76 69 158  Temp: 98.2 F (36.8 C)  98.4 F (36.9 C) 98.5 F (36.9 C)  TempSrc: Oral  Oral Oral  Resp: 22 14 17 16   Height: 5\' 10"  (1.778 m)     Weight: 81.058 kg (178 lb 11.2 oz)     SpO2: 100%  100% 100%    Intake/Output Summary (Last 24 hours) at 12/27/15 1115 Last data filed at 12/27/15 1012  Gross per 24 hour  Intake    480 ml  Output      0 ml  Net    480 ml   Filed Weights   12/26/15 2029 12/27/15 0132  Weight: 89.359 kg (197 lb) 81.058 kg (178 lb 11.2 oz)    Examination:  General exam: Pleasant middle-aged male lying comfortably supine in bed. Respiratory system: Clear to auscultation. Respiratory effort normal. Cardiovascular system: S1 & S2 heard, irregularly irregular. No JVD, murmurs, rubs, gallops or clicks. Telemetry: A. fib with ventricular rate in the 70s. Gastrointestinal system: Abdomen is  nondistended, soft and nontender. No organomegaly or masses felt. Normal bowel sounds heard. Central nervous system: Alert and oriented. No focal neurological deficits. Extremities: Symmetric 5 x 5 power. Healed stumps of bilateral BKA. Skin: No rashes, lesions or ulcers Psychiatry: Judgement and insight appear normal. Mood & affect appropriate.     Data Reviewed: I have personally reviewed following  labs and imaging studies  CBC:  Recent Labs Lab 12/26/15 2050 12/27/15 0141  WBC 6.4 7.8  HGB 12.8* 13.4  HCT 39.6 41.9  MCV 87.8 90.5  PLT [id number]   Basic Metabolic Panel:  Recent Labs Lab 12/26/15 2050 12/27/15 0141  NA 134*  --   K 4.1  --   CL 95*  --   CO2 25  --   GLUCOSE 147*  --   BUN 32*  --   CREATININE 6.67*  --   CALCIUM 9.1  --   MG  --  2.2   GFR: Estimated Creatinine Clearance: 11.6 mL/min (by C-G formula based on Cr of 6.67). Liver Function Tests: No results for input(s): AST, ALT, ALKPHOS, BILITOT, PROT, ALBUMIN in the last 168 hours. No results for input(s): LIPASE, AMYLASE in the last 168 hours. No results for input(s): AMMONIA in the last 168 hours. Coagulation Profile:  Recent Labs Lab 12/26/15 2150 12/27/15 0141  INR 1.19 1.10   Cardiac Enzymes:  Recent Labs Lab 12/26/15 2136 12/27/15 0141 12/27/15 0905  TROPONINI 0.05* 0.05* 0.05*   BNP (last 3 results) No results for input(s): PROBNP in the last 8760 hours. HbA1C: No results for input(s): HGBA1C in the last 72 hours. CBG:  Recent Labs Lab 12/26/15 2118 12/27/15 0747  GLUCAP 150* 137*   Lipid Profile: No results for input(s): CHOL, HDL, LDLCALC, TRIG, CHOLHDL, LDLDIRECT in the last 72 hours. Thyroid Function Tests: No results for input(s): TSH, T4TOTAL, FREET4, T3FREE, THYROIDAB in the last 72 hours. Anemia Panel: No results for input(s): VITAMINB12, FOLATE, FERRITIN, TIBC, IRON, RETICCTPCT in the last 72 hours.  Sepsis Labs: No results for input(s): PROCALCITON, LATICACIDVEN in the last 168 hours.  Recent Results (from the past 240 hour(s))  MRSA PCR Screening     Status: None   Collection Time: 12/27/15  1:45 AM  Result Value Ref Range Status   MRSA by PCR NEGATIVE NEGATIVE Final    Comment:        The GeneXpert MRSA Assay (FDA approved for NASAL specimens only), is one component of a comprehensive MRSA colonization surveillance program. It is not intended  to diagnose MRSA infection nor to guide or monitor treatment for MRSA infections.          Radiology Studies: Dg Chest 2 View  12/26/2015  CLINICAL DATA:  Acute onset of generalized weakness and vomiting. Initial encounter. EXAM: CHEST  2 VIEW COMPARISON:  Chest radiograph performed 10/22/2013 FINDINGS: The lungs are well-aerated. Mild vascular congestion is noted. There is no evidence of focal opacification, pleural effusion or pneumothorax. The heart is normal in size; the mediastinal contour is within normal limits. No acute osseous abnormalities are seen. A vascular stent is noted at the left arm. IMPRESSION: Mild vascular congestion noted.  Lungs remain grossly clear. Electronically Signed   By: Garald Balding M.D.   On: 12/26/2015 22:29        Scheduled Meds: . aspirin EC  81 mg Oral Daily  . atorvastatin  10 mg Oral q1800  . calcium acetate  2,001 mg Oral TID WC  . cinacalcet  30 mg Oral QPM  . enoxaparin (LOVENOX) injection  1 mg/kg Subcutaneous Q24H  . insulin aspart  0-15 Units Subcutaneous TID WC  . insulin aspart  0-5 Units Subcutaneous QHS  . labetalol  300 mg Oral BID  . latanoprost  1 drop Both Eyes QHS  . pantoprazole  40 mg Oral Daily  . warfarin  5 mg Oral ONCE-1800  . Warfarin - Pharmacist Dosing Inpatient   Does not apply q1800   Continuous Infusions: . diltiazem (CARDIZEM) infusion Stopped (12/27/15 1029)     LOS: 0 days    Time spent: 40 minutes.    [name], MD [facility] Pager [phone]  If 7PM-7AM, please contact night-coverage [email or web address] Password [email or web address] 12/27/2015, 11:15 AM

## 2015-12-27 NOTE — Significant Event (Signed)
Rapid Response Event Note  Overview: Time Called: 1340 Event Type: Neurologic  Initial Focused Assessment: Called by primary, per primary patient had left sides droop, left arm weakness, and was disoriented.  Upon assessment, patient was disoriented, only oriented to self, but able to follow all commands, only neuro deficits were orientation and facial droop, NIH of 2, patient taken to CT, code stroke cancelled.    Event Summary:  Name of Physician Notified: Nand[place]am at      at    Outcome: Stayed in room and stabalized  Event End Time: [place], [place]

## 2015-12-27 NOTE — Plan of Care (Signed)
Problem: Pain Managment: Goal: General experience of comfort will improve Outcome: Completed/Met Date Met:  12/27/15 Pt educated on pain scale and available interventions. Pt verbalized understanding.

## 2015-12-27 NOTE — Progress Notes (Addendum)
ANTICOAGULATION CONSULT NOTE - Follow Up Consult  Pharmacy Consult for Lovenox and warfarin Indication: atrial fibrillation  No Known Allergies  Patient Measurements: Height: 5\' 10"  (177.8 cm) Weight: 178 lb 11.2 oz (81.058 kg) IBW/kg (Calculated) : 73 Heparin Dosing Weight:   Vital Signs: Temp: 98.5 F (36.9 C) (06/02 0800) Temp Source: Oral (06/02 0800) BP: 132/100 mmHg (06/02 0800) Pulse Rate: 158 (06/02 0800)  Labs:  Recent Labs  12/26/15 2050 12/26/15 2136 12/26/15 2150 12/27/15 0141  HGB 12.8*  --   --  13.4  HCT 39.6  --   --  41.9  PLT 239  --   --  236  LABPROT  --   --  15.2 14.4  INR  --   --  1.19 1.10  CREATININE 6.67*  --   --   --   TROPONINI  --  0.05*  --  0.05*    Estimated Creatinine Clearance: 11.6 mL/min (by C-G formula based on Cr of 6.67).   Medical History: Past Medical History  Diagnosis Date  . ESRD on hemodialysis ([place]) 05/05/2007    ESRD due to DM/HTN. Started dialysis in November 2013.  HD TTS at [facility] on [place].  [name] BACK PAIN, LUMBAR, CHRONIC 08/06/2009  . BENIGN PROSTATIC HYPERTROPHY 08/01/2010  . CEREBROVASCULAR ACCIDENT, HX OF 08/06/2009  . CHOLELITHIASIS 08/01/2010  . CONGESTIVE HEART FAILURE 03/18/2009  . DEPRESSION 03/18/2009  . DIABETES MELLITUS, TYPE II 03/25/2007  . ERECTILE DYSFUNCTION 03/25/2007  . GERD 03/25/2007  . HEPATITIS C, HX OF 03/25/2007  . HYPERTENSION 03/25/2007  . Morbid obesity ([place]) 03/25/2007  . NEPHROLITHIASIS, HX OF 03/18/2009  . Complication of anesthesia     wife states pt had trouble waking up with his last surgery in Nov., 2014  . History of Clostridium difficile   . Hemorrhoids   . Anemia   . Antral ulcer 2014    small    Medications:  Prescriptions prior to admission  Medication Sig Dispense Refill Last Dose  . acetaminophen (TYLENOL) 500 MG tablet Take 500 mg by mouth every 6 (six) hours as needed for mild pain.   4 months  . amLODipine (NORVASC) 10 MG tablet TAKE 1 TABLET BY MOUTH DAILY  (Patient taking differently: TAKE 1 TABLET BY MOUTH every evening) 90 tablet 0 12/25/2015 at Unknown time  . aspirin EC 81 MG tablet Take 81 mg by mouth daily.   12/26/2015 at 1200  . atorvastatin (LIPITOR) 10 MG tablet Take 1 tablet (10 mg total) by mouth daily. (Patient taking differently: Take 10 mg by mouth daily at 6 PM. ) 90 tablet 3 12/25/2015 at Unknown time  . calcium acetate (PHOSLO) 667 MG capsule Take 2 capsules (1,334 mg total) by mouth 3 (three) times daily with meals. (Patient taking differently: Take 2,001 mg by mouth 3 (three) times daily with meals. ) 180 capsule 0 12/26/2015 at Unknown time  . hydrALAZINE (APRESOLINE) 100 MG tablet Take 1 tablet (100 mg total) by mouth every 8 (eight) hours. 90 tablet 11 12/26/2015 at Unknown time  . hydrOXYzine (ATARAX/VISTARIL) 25 MG tablet Take 1 tablet (25 mg total) by mouth every 6 (six) hours as needed. (Patient taking differently: Take 25 mg by mouth every 6 (six) hours as needed for itching. ) 40 tablet 2 12/25/2015 at Unknown time  . ketoconazole (NIZORAL) 2 % cream Apply 1 application topically daily. 30 g 1 12/25/2015 at Unknown time  . labetalol (NORMODYNE) 300 MG tablet Take 1 tablet (  300 mg total) by mouth 3 (three) times daily. (Patient taking differently: Take 300 mg by mouth 2 (two) times daily. ) 90 tablet 11 12/26/2015 at 1200  . latanoprost (XALATAN) 0.005 % ophthalmic solution place 1 drop into right eye at bedtime  0 12/25/2015 at Unknown time  . lidocaine-prilocaine (EMLA) cream APPLY A SMALL AMOUNT TO SKIN AT THE ACCESS SITE (AVF) AS DIRECTED BEFORE EACH DIALYSIS SESSION THREE DAYS A WEEK  6 12/26/2015 at Unknown time  . PREVIDENT 5000 PLUS 1.1 % CREA dental cream Place 1 application onto teeth every morning.   6 12/26/2015 at Unknown time  . SENSIPAR 30 MG tablet Take 30 mg by mouth every evening.    12/25/2015 at Unknown time  . silver sulfADIAZINE (SILVADENE) 1 % cream Apply 1 application topically daily.   12/26/2015 at Unknown time  . Skin  Protectants, Misc. (EUCERIN) cream Apply 1 application topically as needed for dry skin.   12/26/2015 at Unknown time  . VOLTAREN 1 % GEL APPLY 2 GRAMS TO HANDS BID TO TID  0 12/25/2015 at Unknown time    Assessment: 65 yo male presents with fatigue, also is ESRD on HD, found to have new onset AFib. Discussed oral anticoagulants with provider, warfarin is best option due to ESRD. CHADSVASc =5. Warfarin 5mg  x1 given last PM no change in INR yet as expected INR 1.1, patient re-weighed on floor this am 81kg> will adjust enoxaparin dose accordingly   Goal of Therapy:  Anti-Xa level 0.6-1 units/ml 4hrs after LMWH dose given Monitor platelets by anticoagulation protocol: Yes    Plan:  -Lovenox 80 mg [place] q24h -Warfarin 5 mg po x1 - repeat -Daily INR, CBC q72h   Bonnita Nasuti Pharm.D. CPP, BCPS Clinical Pharmacist [phone] 12/27/2015 9:51 AM    Addendum Change lovenox to heparin drip d/t ESRD D/c lovenox - last dose 0200 6/2 Begin heparin drip at low rate 4hr before next  lovenox dose would be due Heparin drip 800 uts/hr at 2000 Check 6hr HL and daily   Bonnita Nasuti Pharm.D. CPP, BCPS Clinical Pharmacist [phone] 12/27/2015 11:47 AM

## 2015-12-27 NOTE — Progress Notes (Addendum)
Addendum  Called earlier this afternoon by Ms. Jettie Booze, Cardiology NP who noted new onset AMS during her evaluation concerning for stroke. Stroke code called. I discussed with Dr.Nandigam who evaluated patient and did not find focal deficits. CBG not hypoglycemic. CT head ordered and reviewed-no acute abnormalities. Returned and examined patient later and no focal deficits noted. Monitor.  Vernell Leep, MD, FACP, FHM. [facility] Pager [phone]  If 7PM-7AM, please contact night-coverage [email or web address] Password [facility] 12/27/2015, 5:42 PM

## 2015-12-28 DIAGNOSIS — E785 Hyperlipidemia, unspecified: Secondary | ICD-10-CM

## 2015-12-28 DIAGNOSIS — N186 End stage renal disease: Secondary | ICD-10-CM

## 2015-12-28 DIAGNOSIS — Z992 Dependence on renal dialysis: Secondary | ICD-10-CM

## 2015-12-28 DIAGNOSIS — I5032 Chronic diastolic (congestive) heart failure: Secondary | ICD-10-CM

## 2015-12-28 LAB — COMPREHENSIVE METABOLIC PANEL
ALK PHOS: 36 U/L — AB (ref 38–126)
ALT: 8 U/L — AB (ref 17–63)
AST: 9 U/L — ABNORMAL LOW (ref 15–41)
Albumin: 2.9 g/dL — ABNORMAL LOW (ref 3.5–5.0)
Anion gap: 14 (ref 5–15)
BUN: 59 mg/dL — ABNORMAL HIGH (ref 6–20)
CALCIUM: 8.7 mg/dL — AB (ref 8.9–10.3)
CO2: 24 mmol/L (ref 22–32)
CREATININE: 9.44 mg/dL — AB (ref 0.61–1.24)
Chloride: 92 mmol/L — ABNORMAL LOW (ref 101–111)
GFR calc non Af Amer: 5 mL/min — ABNORMAL LOW (ref >60)
GFR, EST AFRICAN AMERICAN: 6 mL/min — AB (ref >60)
GLUCOSE: 119 mg/dL — AB (ref 65–99)
Potassium: 4.2 mmol/L (ref 3.5–5.1)
SODIUM: 130 mmol/L — AB (ref 135–145)
Total Bilirubin: 0.7 mg/dL (ref 0.3–1.2)
Total Protein: 6.5 g/dL (ref 6.5–8.1)

## 2015-12-28 LAB — GLUCOSE, CAPILLARY
GLUCOSE-CAPILLARY: 107 mg/dL — AB (ref 65–99)
Glucose-Capillary: 93 mg/dL (ref 65–99)
Glucose-Capillary: 100 mg/dL — ABNORMAL HIGH (ref 65–99)
Glucose-Capillary: 166 mg/dL — ABNORMAL HIGH (ref 65–99)

## 2015-12-28 LAB — PROTIME-INR
INR: 1.36 (ref 0.00–1.49)
Prothrombin Time: 16.9 seconds — ABNORMAL HIGH (ref 11.6–15.2)

## 2015-12-28 LAB — HEPARIN LEVEL (UNFRACTIONATED)
Heparin Unfractionated: 0.24 IU/mL — ABNORMAL LOW (ref 0.30–0.70)
Heparin Unfractionated: 0.37 IU/mL (ref 0.30–0.70)

## 2015-12-28 LAB — CBC
HEMATOCRIT: 33.5 % — AB (ref 39.0–52.0)
Hemoglobin: 10.9 g/dL — ABNORMAL LOW (ref 13.0–17.0)
MCH: 28.7 pg (ref 26.0–34.0)
MCHC: 32.5 g/dL (ref 30.0–36.0)
MCV: 88.2 fL (ref 78.0–100.0)
Platelets: 205 10*3/uL (ref 150–400)
RBC: 3.8 MIL/uL — AB (ref 4.22–5.81)
RDW: 15.4 % (ref 11.5–15.5)
WBC: 5.8 10*3/uL (ref 4.0–10.5)

## 2015-12-28 MED ORDER — DILTIAZEM HCL ER COATED BEADS 120 MG PO TB24
120.0000 mg | ORAL_TABLET | Freq: Every day | ORAL | Status: DC
  Filled 2015-12-28: qty 1

## 2015-12-28 MED ORDER — SODIUM CHLORIDE 0.9 % IV SOLN: 100.0000 mL | INTRAVENOUS | Status: DC | PRN

## 2015-12-28 MED ORDER — HEPARIN SODIUM (PORCINE) 1000 UNIT/ML DIALYSIS: 1000.0000 [IU] | INTRAMUSCULAR | Status: DC | PRN

## 2015-12-28 MED ORDER — HEPARIN SODIUM (PORCINE) 1000 UNIT/ML DIALYSIS: 3500.0000 [IU] | INTRAMUSCULAR | Status: DC | PRN

## 2015-12-28 MED ORDER — LIDOCAINE HCL (PF) 1 % IJ SOLN: 5.0000 mL | INTRAMUSCULAR | Status: DC | PRN

## 2015-12-28 MED ORDER — PENTAFLUOROPROP-TETRAFLUOROETH EX AERO: 1.0000 "application " | INHALATION_SPRAY | CUTANEOUS | Status: DC | PRN

## 2015-12-28 MED ORDER — LIDOCAINE-PRILOCAINE 2.5-2.5 % EX CREA
1.0000 | TOPICAL_CREAM | CUTANEOUS | Status: DC | PRN
Start: 2015-12-28 — End: 2015-12-28

## 2015-12-28 MED ORDER — DILTIAZEM HCL ER COATED BEADS 120 MG PO CP24
120.0000 mg | ORAL_CAPSULE | Freq: Every day | ORAL | Status: DC
  Administered 2015-12-28 – 2015-12-31 (×4): 120 mg via ORAL
  Filled 2015-12-28 (×5): qty 1

## 2015-12-28 MED ORDER — WARFARIN SODIUM 5 MG PO TABS
5.0000 mg | ORAL_TABLET | Freq: Once | ORAL | Status: AC
  Administered 2015-12-28: 5 mg via ORAL
  Filled 2015-12-28: qty 1

## 2015-12-28 MED ORDER — ALTEPLASE 2 MG IJ SOLR: 2.0000 mg | Freq: Once | INTRAMUSCULAR | Status: DC | PRN

## 2015-12-28 NOTE — Progress Notes (Addendum)
Patient Name: Oscar Castillo Date of Encounter: 12/28/2015  Principal Problem:   New onset a-fib [facility]) Active Problems:   Essential hypertension   GERD   Hyperlipidemia   Chronic combined systolic (EF [id number]) and grade 2diastolic congestive heart failure   ESRD on hemodialysis (HCC)   Glaucoma   LBBB (left bundle branch block)   Confusion   Length of Stay: 1  SUBJECTIVE  Seems much more alert today, oriented to self and location. Atrial fibrillation resolved just a few minutes ago, rate was reasonably well controlled before conversion to NSR.  CURRENT MEDS . aspirin EC  81 mg Oral Daily  . atorvastatin  10 mg Oral q1800  . calcium acetate  2,001 mg Oral TID WC  . cinacalcet  30 mg Oral QPM  . insulin aspart  0-15 Units Subcutaneous TID WC  . insulin aspart  0-5 Units Subcutaneous QHS  . latanoprost  1 drop Both Eyes QHS  . metoprolol tartrate  12.5 mg Oral QID  . pantoprazole  40 mg Oral Daily  . Warfarin - Pharmacist Dosing Inpatient   Does not apply q1800    OBJECTIVE   Intake/Output Summary (Last 24 hours) at 12/28/15 0813 Last data filed at 12/28/15 0659  Gross per 24 hour  Intake 2267.54 ml  Output      1 ml  Net 2266.54 ml   Filed Weights   12/26/15 2029 12/27/15 0132 12/28/15 0400  Weight: 89.359 kg (197 lb) 81.058 kg (178 lb 11.2 oz) 84.732 kg (186 lb 12.8 oz)    PHYSICAL EXAM Filed Vitals:   12/27/15 2105 12/27/15 2350 12/28/15 0030 12/28/15 0400  BP: 106/74 127/79 122/82 122/81  Pulse: 93 101 87 48  Temp:   98.6 F (37 C) 98.3 F (36.8 C)  TempSrc:   Oral Oral  Resp: 15 18 18 12   Height:      Weight:    84.732 kg (186 lb 12.8 oz)  SpO2: 100% 100% 99% 100%   General: Alert, oriented x2, no distress Head: no evidence of trauma, PERRL, EOMI, no exophtalmos or lid lag, no myxedema, no xanthelasma; normal ears, nose and oropharynx Neck: normal jugular venous pulsations and no hepatojugular reflux; brisk carotid pulses without delay and no carotid  bruits Chest: clear to auscultation, no signs of consolidation by percussion or palpation, normal fremitus, symmetrical and full respiratory excursions Cardiovascular: normal position and quality of the apical impulse, regular rhythm, normal first and second heart sounds, no rubs or gallops, 2/6 Ao ejection murmur Abdomen: no tenderness or distention, no masses by palpation, no abnormal pulsatility or arterial bruits, normal bowel sounds, no hepatosplenomegaly Extremities: no clubbing, cyanosis or edema; 2+ radial, ulnar and brachial pulses bilaterally; bilateral BKA; no subclavian or femoral bruits. Left upper arm AV fistula with good thrill. Neurological: grossly nonfocal  LABS  CBC  Recent Labs  12/26/15 2050 12/27/15 0141  WBC 6.4 7.8  HGB 12.8* 13.4  HCT 39.6 41.9  MCV 87.8 90.5  PLT 239 [id number]   Basic Metabolic Panel  Recent Labs  12/26/15 2050 12/27/15 0141 12/28/15 0420  NA 134*  --  130*  K 4.1  --  4.2  CL 95*  --  92*  CO2 25  --  24  GLUCOSE 147*  --  119*  BUN 32*  --  59*  CREATININE 6.67*  --  9.44*  CALCIUM 9.1  --  8.7*  MG  --  2.2  --    Liver Function  Tests  Recent Labs  12/28/15 0420  AST 9*  ALT 8*  ALKPHOS 36*  BILITOT 0.7  PROT 6.5  ALBUMIN 2.9*   No results for input(s): LIPASE, AMYLASE in the last 72 hours. Cardiac Enzymes  Recent Labs  12/27/15 0141 12/27/15 0905 12/27/15 1451  TROPONINI 0.05* 0.05* 0.05*    Radiology Studies Imaging results have been reviewed and Dg Chest 2 View  12/26/2015  CLINICAL DATA:  Acute onset of generalized weakness and vomiting. Initial encounter. EXAM: CHEST  2 VIEW COMPARISON:  Chest radiograph performed 10/22/2013 FINDINGS: The lungs are well-aerated. Mild vascular congestion is noted. There is no evidence of focal opacification, pleural effusion or pneumothorax. The heart is normal in size; the mediastinal contour is within normal limits. No acute osseous abnormalities are seen. A vascular stent is  noted at the left arm. IMPRESSION: Mild vascular congestion noted.  Lungs remain grossly clear. Electronically Signed   By: Garald Balding M.D.   On: 12/26/2015 22:29   Ct Head Wo Contrast  12/27/2015  CLINICAL DATA:  Left eye droop and hand weakness EXAM: CT HEAD WITHOUT CONTRAST TECHNIQUE: Contiguous axial images were obtained from the base of the skull through the vertex without intravenous contrast. COMPARISON:  None. FINDINGS: Brain: No evidence of acute infarction, hemorrhage, extra-axial collection, ventriculomegaly, or mass effect. Vascular: No hyperdense vessel or unexpected calcification. Skull: Negative for fracture or focal lesion. Sinuses/Orbits: No acute findings. Other: Mild white matter changes. IMPRESSION: 1. No acute abnormalities identified to explain the patient's symptoms. No bleed or acute ischemia is seen. Findings called to Dr. Silverio Decamp at 2:18 p.m. Electronically Signed   By: Dorise Bullion III M.D   On: 12/27/2015 14:21   Mr Brain Wo Contrast  12/27/2015  CLINICAL DATA:  Stroke. Altered mental status following hemodialysis. Code stroke. EXAM: MRI HEAD WITHOUT CONTRAST TECHNIQUE: Multiplanar, multiecho pulse sequences of the brain and surrounding structures were obtained without intravenous contrast. COMPARISON:  CT head without contrast from the same day. MRI brain 10/19/2013. FINDINGS: The diffusion-weighted images demonstrate no evidence for acute or subacute infarction. Moderate atrophy and white matter disease is similar to the prior study. No acute hemorrhage or mass lesion is present. The ventricles are proportionate to the degree of atrophy. No significant extra-axial fluid collection is present. Remote lacunar infarcts are present in the pons. Cerebellar atrophy is again seen. Flow is present in the major intracranial arteries. A left lens replacement is present. The globes and orbits are otherwise intact. Mild mucosal thickening is present within the anterior ethmoid air cells  and noted left greater than right frontal sinus. There is no fluid. The mastoid air cells are clear. Skullbase is within normal limits. Midline sagittal images demonstrate degenerative change in the upper cervical spine. Atrophy of the posterior corpus callosum is similar to the prior study. IMPRESSION: 1. No acute intracranial abnormality. 2. Stable moderate generalized atrophy and diffuse white matter disease. 3. Remote lacunar infarcts of the brainstem are stable. 4. Mild mucosal disease within the anterior ethmoid air cells and left frontal sinus. Electronically Signed   By: San Morelle M.D.   On: 12/27/2015 20:31    TELE Afib to NSR at 0739h  ECHO - Left ventricle: The cavity size was normal. There was mild  concentric hypertrophy. Systolic function was normal. The  estimated ejection fraction was in the range of 50% to 55%. Wall  motion was normal; there were no regional wall motion  abnormalities. The study was not technically sufficient  to allow  evaluation of LV diastolic dysfunction due to atrial  fibrillation. - Ventricular septum: Septal motion showed moderate paradox. These  changes are consistent with intraventricular conduction delay. - Aortic valve: Severe diffuse thickening and calcification. Valve  mobility was mildly restricted. - Mitral valve: There was mild regurgitation. Left atrium: The atrium was normal in size.  ECG atrial fibrillation, LBBB  ASSESSMENT AND PLAN  1. New onset Afib: first documented event, not clear that the arrhythmia was responsible for his symptoms, rate was easily controlled with diltiazem. Left atrium is not dilated and LVEF is normal. Antiarrhythmics do not appear justified. Long term anticoagulation is recommended. Will recommend switching amlodipine to diltiazem for rate control with breakthrough AF. This patients CHA2DS2-VASc Score and unadjusted Ischemic Stroke Rate (% per year) is equal to 3.2 % stroke rate/year from a  score of 3. Above score calculated as 1 point each if present [CHF, HTN, DM, Vascular=MI/PAD/Aortic Plaque, Age if 65-74, or Male], 2 points each if present [Age > 75, or Stroke/TIA/TE]   Received Lovenox (last dose 0200 on 12/27/15), Switching to heparin today Neuro to see pt For long term coumadin  2. New LBBB Echo without regional wall motion abnormalities. No angina. High risk for CAD. Recommend lexiscan Myoview tomorrow (in HD today)  3. Chronic diastolic CHF: Echo pending this admission. EF 55-60% in 2015. Volume managed per HD.No signs of volume overload by exam or echo   4. HTN: Home amlodipine and hydralazine held. Start diltiazem.  5. HLD: LDL is 111, on low intensity statin. Increase atorvastatin to 40mg .with history of DM. Goal around 70  6. Elevated troponin: minimal and with flat trend, not consistent with an ischemic event.  Sanda Klein, MD, [facility] [phone] office [phone] pager 12/28/2015 8:13 AM

## 2015-12-28 NOTE — Progress Notes (Signed)
ANTICOAGULATION CONSULT NOTE - Follow Up Consult  Pharmacy Consult for Heparin and warfarin Indication: atrial fibrillation  No Known Allergies  Patient Measurements: Height: 5\' 10"  (177.8 cm) Weight: 184 lb 11.9 oz (83.8 kg) IBW/kg (Calculated) : 73 Heparin Dosing Weight:   Vital Signs: Temp: 98.2 F (36.8 C) (06/03 1206) Temp Source: Oral (06/03 1206) BP: 142/81 mmHg (06/03 1206) Pulse Rate: 82 (06/03 1206)  Labs:  Recent Labs  12/26/15 2050  12/26/15 2150 12/27/15 0141 12/27/15 0905 12/27/15 1451 12/28/15 0420 12/28/15 0844 12/28/15 1342  HGB 12.8*  --   --  13.4  --   --   --  10.9*  --   HCT 39.6  --   --  41.9  --   --   --  33.5*  --   PLT 239  --   --  236  --   --   --  205  --   LABPROT  --   --  15.2 14.4  --   --  16.9*  --   --   INR  --   --  1.19 1.10  --   --  1.36  --   --   HEPARINUNFRC  --   --   --   --   --   --  0.24*  --  0.37  CREATININE 6.67*  --   --   --   --   --  9.44*  --   --   TROPONINI  --   < >  --  0.05* 0.05* 0.05*  --   --   --   < > = values in this interval not displayed.  Estimated Creatinine Clearance: 8.2 mL/min (by C-G formula based on Cr of 9.44).   Medical History: Past Medical History  Diagnosis Date  . ESRD on hemodialysis ([place]) 05/05/2007    ESRD due to DM/HTN. Started dialysis in November 2013.  HD TTS at [facility] on [address] [place].  [name] BACK PAIN, LUMBAR, CHRONIC 08/06/2009  . BENIGN PROSTATIC HYPERTROPHY 08/01/2010  . CEREBROVASCULAR ACCIDENT, HX OF 08/06/2009  . CHOLELITHIASIS 08/01/2010  . CONGESTIVE HEART FAILURE 03/18/2009  . DEPRESSION 03/18/2009  . DIABETES MELLITUS, TYPE II 03/25/2007  . ERECTILE DYSFUNCTION 03/25/2007  . GERD 03/25/2007  . HEPATITIS C, HX OF 03/25/2007  . HYPERTENSION 03/25/2007  . Morbid obesity ([place]) 03/25/2007  . NEPHROLITHIASIS, HX OF 03/18/2009  . Complication of anesthesia     wife states pt had trouble waking up with his last surgery in Nov., 2014  . History of Clostridium difficile   .  Hemorrhoids   . Anemia   . Antral ulcer 2014    small    Medications:  Prescriptions prior to admission  Medication Sig Dispense Refill Last Dose  . acetaminophen (TYLENOL) 500 MG tablet Take 500 mg by mouth every 6 (six) hours as needed for mild pain.   4 months  . amLODipine (NORVASC) 10 MG tablet TAKE 1 TABLET BY MOUTH DAILY (Patient taking differently: TAKE 1 TABLET BY MOUTH every evening) 90 tablet 0 12/25/2015 at Unknown time  . aspirin EC 81 MG tablet Take 81 mg by mouth daily.   12/26/2015 at 1200  . atorvastatin (LIPITOR) 10 MG tablet Take 1 tablet (10 mg total) by mouth daily. (Patient taking differently: Take 10 mg by mouth daily at 6 PM. ) 90 tablet 3 12/25/2015 at Unknown time  . calcium acetate (PHOSLO) 667 MG capsule Take 2 capsules (  1,334 mg total) by mouth 3 (three) times daily with meals. (Patient taking differently: Take 2,001 mg by mouth 3 (three) times daily with meals. ) 180 capsule 0 12/26/2015 at Unknown time  . hydrALAZINE (APRESOLINE) 100 MG tablet Take 1 tablet (100 mg total) by mouth every 8 (eight) hours. 90 tablet 11 12/26/2015 at Unknown time  . hydrOXYzine (ATARAX/VISTARIL) 25 MG tablet Take 1 tablet (25 mg total) by mouth every 6 (six) hours as needed. (Patient taking differently: Take 25 mg by mouth every 6 (six) hours as needed for itching. ) 40 tablet 2 12/25/2015 at Unknown time  . ketoconazole (NIZORAL) 2 % cream Apply 1 application topically daily. 30 g 1 12/25/2015 at Unknown time  . labetalol (NORMODYNE) 300 MG tablet Take 1 tablet (300 mg total) by mouth 3 (three) times daily. (Patient taking differently: Take 300 mg by mouth 2 (two) times daily. ) 90 tablet 11 12/26/2015 at 1200  . latanoprost (XALATAN) 0.005 % ophthalmic solution place 1 drop into right eye at bedtime  0 12/25/2015 at Unknown time  . lidocaine-prilocaine (EMLA) cream APPLY A SMALL AMOUNT TO SKIN AT THE ACCESS SITE (AVF) AS DIRECTED BEFORE EACH DIALYSIS SESSION THREE DAYS A WEEK  6 12/26/2015 at Unknown  time  . PREVIDENT 5000 PLUS 1.1 % CREA dental cream Place 1 application onto teeth every morning.   6 12/26/2015 at Unknown time  . SENSIPAR 30 MG tablet Take 30 mg by mouth every evening.    12/25/2015 at Unknown time  . silver sulfADIAZINE (SILVADENE) 1 % cream Apply 1 application topically daily.   12/26/2015 at Unknown time  . Skin Protectants, Misc. (EUCERIN) cream Apply 1 application topically as needed for dry skin.   12/26/2015 at Unknown time  . VOLTAREN 1 % GEL APPLY 2 GRAMS TO HANDS BID TO TID  0 12/25/2015 at Unknown time    Assessment: 65 yo male presents with fatigue, also is ESRD on HD, found to have new onset AFib. Discussed oral anticoagulants with provider, warfarin is best option due to ESRD. CHADSVASc =5. Warfarin 5mg x2 given INR 1.1>1.4 CBC stable continue Heparin drip 900 uts/hr HL 0.37 at goal   Goal of Therapy:  Heparin level 0.3-0.7 INR 2-3 Monitor platelets by anticoagulation protocol: Yes    Plan:  Heparin drip 900 uts/hr -Warfarin 5 mg po x1 - repeat -Daily INR, CBC q72h   Bonnita Nasuti Pharm.D. CPP, BCPS Clinical Pharmacist [phone] 12/28/2015 2:34 PM

## 2015-12-28 NOTE — Progress Notes (Signed)
ANTICOAGULATION CONSULT NOTE - Follow Up Consult  Pharmacy Consult for heparin Indication: atrial fibrillation  Labs:  Recent Labs  12/26/15 2050  12/26/15 2150 12/27/15 0141 12/27/15 0905 12/27/15 1451 12/28/15 0420  HGB 12.8*  --   --  13.4  --   --   --   HCT 39.6  --   --  41.9  --   --   --   PLT 239  --   --  236  --   --   --   LABPROT  --   --  15.2 14.4  --   --  16.9*  INR  --   --  1.19 1.10  --   --  1.36  HEPARINUNFRC  --   --   --   --   --   --  0.24*  CREATININE 6.67*  --   --   --   --   --  9.44*  TROPONINI  --   < >  --  0.05* 0.05* 0.05*  --   < > = values in this interval not displayed.    Assessment: 65yo male subtherapeutic on heparin with initial dosing transitioning from LMWH to UFH; level may still be affected by LMWH.  Goal of Therapy:  Heparin level 0.3-0.7 units/ml   Plan:  Will increase heparin gtt slightly to 900 units/hr and check level in [place], PharmD, BCPS  12/28/2015,5:36 AM

## 2015-12-28 NOTE — Progress Notes (Signed)
  [facility] Progress Note   Subjective: no c/o's  Filed Vitals:   12/28/15 0835 12/28/15 0840 12/28/15 0900 12/28/15 0930  BP: 126/71 132/63 128/75 116/72  Pulse: 84 80 85 82  Temp: 98.3 F (36.8 C)     TempSrc: Oral     Resp: 16     Height:      Weight: 84.1 kg (185 lb 6.5 oz)     SpO2: 100%       Inpatient medications: . aspirin EC  81 mg Oral Daily  . atorvastatin  10 mg Oral q1800  . calcium acetate  2,001 mg Oral TID WC  . cinacalcet  30 mg Oral QPM  . diltiazem  120 mg Oral Daily  . insulin aspart  0-15 Units Subcutaneous TID WC  . insulin aspart  0-5 Units Subcutaneous QHS  . latanoprost  1 drop Both Eyes QHS  . metoprolol tartrate  12.5 mg Oral QID  . pantoprazole  40 mg Oral Daily  . Warfarin - Pharmacist Dosing Inpatient   Does not apply q1800   . sodium chloride 11 mL/hr at 12/28/15 [id number]  . heparin 900 Units/hr (12/28/15 0611)   sodium chloride, sodium chloride, acetaminophen, ALPRAZolam, alteplase, heparin, [START ON 12/29/2015] heparin, hydrocerin, hydrOXYzine, lidocaine (PF), lidocaine-prilocaine, ondansetron **OR** ondansetron (ZOFRAN) IV, pentafluoroprop-tetrafluoroeth  Exam: Exam Alert, facial droop and dysathria, no distress, calm No rash, cyanosis or gangrene Sclera anicteric, throat clear  No jvd or bruits  Chest clear bilat Cor irreg irreg no mrg Abd soft ntnd no mass or ascites +bs  GU normal male MS bilat BKA, no wounds or edema Neuro is alert, Ox 3   Dialysis: TTS [place] 3.5h 82.5kg 2/2.5 bath P4 Hep 7000 LUA AVF Hect 5 Ven 50/wk Mirc 50 q 4 last 5/16    Assessment: 1 Afib w RVR - new onset, IV dilt, lovenox/ coumadin 2 Diarrhea , hx Cdif 3 N/V 4 ESRD usual HD TTS, has not missed HD 5 PVD bilat BKA 6 Hx CVA 7 HTN stable  Plan - HD today, UF 2kg    Kelly Splinter MD [facility] pager [phone]    cell [phone] 12/28/2015, 9:56 AM    Recent Labs Lab 12/26/15 2050 12/28/15 0420   NA 134* 130*  K 4.1 4.2  CL 95* 92*  CO2 25 24  GLUCOSE 147* 119*  BUN 32* 59*  CREATININE 6.67* 9.44*  CALCIUM 9.1 8.7*    Recent Labs Lab 12/28/15 0420  AST 9*  ALT 8*  ALKPHOS 36*  BILITOT 0.7  PROT 6.5  ALBUMIN 2.9*    Recent Labs Lab 12/26/15 2050 12/27/15 0141 12/28/15 0844  WBC 6.4 7.8 5.8  HGB 12.8* 13.4 10.9*  HCT 39.6 41.9 33.5*  MCV 87.8 90.5 88.2  PLT 239 236 205

## 2015-12-28 NOTE — Progress Notes (Addendum)
PROGRESS NOTE  Oscar Castillo  [name] DOB: 10/02/1950  DOA: 12/26/2015 PCP: [name], MD   Brief Narrative:  65 y.o. male with medical history significant of ESRD on TTS hemodialysis, type 2 diabetes, hypertension, depression, GERD, history of hepatitis C, anemia, BPH, chronic combined diastolic and systolic CHF with last ejection fraction measured at 45%, b/l BKA who comes to the emergency department with complaints of feeling very tired and fatigued after he came home from hemodialysis. Workup in the emergency department showed a mildly elevated troponin level, EKG showing atrial fibrillation with RVR, mild vascular congestion on chest x-ray.  Assessment & Plan:   Principal Problem:   New onset a-fib ([place]) Active Problems:   Essential hypertension   GERD   Hyperlipidemia   Chronic combined systolic (EF [id number]) and grade 2diastolic congestive heart failure   ESRD on hemodialysis (HCC)   Glaucoma   LBBB (left bundle branch block)   Confusion   New onset A. fib with RVR - Admitted to stepdown unit. Initiated on diltiazem drip. Ventricular rate controlled in the 70s. DC diltiazem drip. Home labetalol was changed to metoprolol 12.5 MG 4 times a day. Cardizem CD 120 MG added on 6/3. - CHA2-DS2-VASC Score: At least 3. Started on Lovenox bridging and Coumadin per pharmacy. Switched to IV heparin infusion bridging given ESRD. - 2-D echo: Normal LVEF and TSH. - Cardiology consultation and follow-up appreciated >not clear that the arrhythmia was responsible for his symptoms. Discontinued amlodipine and hydralazine-were not started since admission.   New LBBB - Echo without regional wall motion abnormalities and no chest pain. As per cardiology, high risk for CAD and recommend Lexiscan Myoview on 12/29/15.  Essential hypertension - Mildly uncontrolled. Continued on labetalol. Amlodipine and hydralazine discontinued and patient started on diltiazem 120 MG daily.   GERD -  PPI  Hyperlipidemia - Continue atorvastatin.  Chronic combined systolic and diastolic CHF - Repeat 2-D echo. Volume management across HD. Seems compensated. However chest x-ray suggests mild pulmonary congestion.  ESRD on TTS HD - Nephrology consulted for dialysis needs.Undergoing HD on 6/3.   Type II DM/IDDM -Continue SSI. Good inpatient control.  Elevated troponin - Minimal and flat trend. Likely secondary to ESRD and demand ischemia from RVR. Cardiology input pending. For [employer] on 6/4.   S/P b/l BKA.  Transient altered mental status/strokelike symptoms on 6/2 - Neurology consulted. CT head and MRI brain without acute findings. Resolved. Monitor. Being anticoagulated for new onset A. fib.      DVT prophylaxis: IV heparin bridging and Coumadin. Code Status: Full Family Communication: Discussed with patient and spouse at bedside.  Disposition Plan: DC home when medically stable.   Consultants:   Cardiology  Nephrology  Procedures:   HD  Antimicrobials:   None    Subjective: Seen at HD. Denies complaints. No strokelike symptoms or chest pain. No palpitations.   Objective:  Filed Vitals:   12/28/15 1100 12/28/15 1130 12/28/15 1200 12/28/15 1206  BP: 118/71 123/75 146/81   Pulse: 81 82 81 82  Temp:    98.2 F (36.8 C)  TempSrc:    Oral  Resp:    18  Height:      Weight:      SpO2:    100%    Intake/Output Summary (Last 24 hours) at 12/28/15 1214 Last data filed at 12/28/15 1206  Gross per 24 hour  Intake 1787.54 ml  Output      1 ml  Net 1786.54 ml  Filed Weights   12/27/15 0132 12/28/15 0400 12/28/15 0835  Weight: 81.058 kg (178 lb 11.2 oz) 84.732 kg (186 lb 12.8 oz) 84.1 kg (185 lb 6.5 oz)    Examination:  General exam: Pleasant middle-aged male lying comfortably supine in bed Undergoing HD this morning . Respiratory system: Clear to auscultation. Respiratory effort normal. Cardiovascular system: S1 & S2 heard, irregularly  irregular. No JVD, murmurs, rubs, gallops or clicks. Telemetry: SR. Gastrointestinal system: Abdomen is nondistended, soft and nontender. No organomegaly or masses felt. Normal bowel sounds heard. Central nervous system: Alert and oriented. No focal neurological deficits. Extremities: Symmetric 5 x 5 power. Healed stumps of bilateral BKA. Skin: No rashes, lesions or ulcers Psychiatry: Judgement and insight appear normal. Mood & affect appropriate.     Data Reviewed: I have personally reviewed following labs and imaging studies  CBC:  Recent Labs Lab 12/26/15 2050 12/27/15 0141 12/28/15 0844  WBC 6.4 7.8 5.8  HGB 12.8* 13.4 10.9*  HCT 39.6 41.9 33.5*  MCV 87.8 90.5 88.2  PLT 239 236 [id number]   Basic Metabolic Panel:  Recent Labs Lab 12/26/15 2050 12/27/15 0141 12/28/15 0420  NA 134*  --  130*  K 4.1  --  4.2  CL 95*  --  92*  CO2 25  --  24  GLUCOSE 147*  --  119*  BUN 32*  --  59*  CREATININE 6.67*  --  9.44*  CALCIUM 9.1  --  8.7*  MG  --  2.2  --    GFR: Estimated Creatinine Clearance: 8.2 mL/min (by C-G formula based on Cr of 9.44). Liver Function Tests:  Recent Labs Lab 12/28/15 0420  AST 9*  ALT 8*  ALKPHOS 36*  BILITOT 0.7  PROT 6.5  ALBUMIN 2.9*   No results for input(s): LIPASE, AMYLASE in the last 168 hours. No results for input(s): AMMONIA in the last 168 hours. Coagulation Profile:  Recent Labs Lab 12/26/15 2150 12/27/15 0141 12/28/15 0420  INR 1.19 1.10 1.36   Cardiac Enzymes:  Recent Labs Lab 12/26/15 2136 12/27/15 0141 12/27/15 0905 12/27/15 1451  TROPONINI 0.05* 0.05* 0.05* 0.05*   BNP (last 3 results) No results for input(s): PROBNP in the last 8760 hours. HbA1C: No results for input(s): HGBA1C in the last 72 hours. CBG:  Recent Labs Lab 12/27/15 1151 12/27/15 1342 12/27/15 1634 12/27/15 2152 12/28/15 0716  GLUCAP 126* 168* 122* 126* 93   Lipid Profile: No results for input(s): CHOL, HDL, LDLCALC, TRIG, CHOLHDL,  LDLDIRECT in the last 72 hours. Thyroid Function Tests: No results for input(s): TSH, T4TOTAL, FREET4, T3FREE, THYROIDAB in the last 72 hours. Anemia Panel: No results for input(s): VITAMINB12, FOLATE, FERRITIN, TIBC, IRON, RETICCTPCT in the last 72 hours.  Sepsis Labs: No results for input(s): PROCALCITON, LATICACIDVEN in the last 168 hours.  Recent Results (from the past 240 hour(s))  MRSA PCR Screening     Status: None   Collection Time: 12/27/15  1:45 AM  Result Value Ref Range Status   MRSA by PCR NEGATIVE NEGATIVE Final    Comment:        The GeneXpert MRSA Assay (FDA approved for NASAL specimens only), is one component of a comprehensive MRSA colonization surveillance program. It is not intended to diagnose MRSA infection nor to guide or monitor treatment for MRSA infections.          Radiology Studies: Dg Chest 2 View  12/26/2015  CLINICAL DATA:  Acute onset of generalized weakness and vomiting.  Initial encounter. EXAM: CHEST  2 VIEW COMPARISON:  Chest radiograph performed 10/22/2013 FINDINGS: The lungs are well-aerated. Mild vascular congestion is noted. There is no evidence of focal opacification, pleural effusion or pneumothorax. The heart is normal in size; the mediastinal contour is within normal limits. No acute osseous abnormalities are seen. A vascular stent is noted at the left arm. IMPRESSION: Mild vascular congestion noted.  Lungs remain grossly clear. Electronically Signed   By: Garald Balding M.D.   On: 12/26/2015 22:29   Ct Head Wo Contrast  12/27/2015  CLINICAL DATA:  Left eye droop and hand weakness EXAM: CT HEAD WITHOUT CONTRAST TECHNIQUE: Contiguous axial images were obtained from the base of the skull through the vertex without intravenous contrast. COMPARISON:  None. FINDINGS: Brain: No evidence of acute infarction, hemorrhage, extra-axial collection, ventriculomegaly, or mass effect. Vascular: No hyperdense vessel or unexpected calcification. Skull:  Negative for fracture or focal lesion. Sinuses/Orbits: No acute findings. Other: Mild white matter changes. IMPRESSION: 1. No acute abnormalities identified to explain the patient's symptoms. No bleed or acute ischemia is seen. Findings called to Dr. Silverio Decamp at 2:18 p.m. Electronically Signed   By: Dorise Bullion III M.D   On: 12/27/2015 14:21   Mr Brain Wo Contrast  12/27/2015  CLINICAL DATA:  Stroke. Altered mental status following hemodialysis. Code stroke. EXAM: MRI HEAD WITHOUT CONTRAST TECHNIQUE: Multiplanar, multiecho pulse sequences of the brain and surrounding structures were obtained without intravenous contrast. COMPARISON:  CT head without contrast from the same day. MRI brain 10/19/2013. FINDINGS: The diffusion-weighted images demonstrate no evidence for acute or subacute infarction. Moderate atrophy and white matter disease is similar to the prior study. No acute hemorrhage or mass lesion is present. The ventricles are proportionate to the degree of atrophy. No significant extra-axial fluid collection is present. Remote lacunar infarcts are present in the pons. Cerebellar atrophy is again seen. Flow is present in the major intracranial arteries. A left lens replacement is present. The globes and orbits are otherwise intact. Mild mucosal thickening is present within the anterior ethmoid air cells and noted left greater than right frontal sinus. There is no fluid. The mastoid air cells are clear. Skullbase is within normal limits. Midline sagittal images demonstrate degenerative change in the upper cervical spine. Atrophy of the posterior corpus callosum is similar to the prior study. IMPRESSION: 1. No acute intracranial abnormality. 2. Stable moderate generalized atrophy and diffuse white matter disease. 3. Remote lacunar infarcts of the brainstem are stable. 4. Mild mucosal disease within the anterior ethmoid air cells and left frontal sinus. Electronically Signed   By: San Morelle M.D.    On: 12/27/2015 20:31        Scheduled Meds: . aspirin EC  81 mg Oral Daily  . atorvastatin  10 mg Oral q1800  . calcium acetate  2,001 mg Oral TID WC  . cinacalcet  30 mg Oral QPM  . diltiazem  120 mg Oral Daily  . insulin aspart  0-15 Units Subcutaneous TID WC  . insulin aspart  0-5 Units Subcutaneous QHS  . latanoprost  1 drop Both Eyes QHS  . metoprolol tartrate  12.5 mg Oral QID  . pantoprazole  40 mg Oral Daily  . Warfarin - Pharmacist Dosing Inpatient   Does not apply q1800   Continuous Infusions: . sodium chloride 11 mL/hr at 12/28/15 0611  . heparin 900 Units/hr (12/28/15 0611)     LOS: 1 day    Time spent: 20 minutes.  [facility], MD [facility] Pager [phone]  If 7PM-7AM, please contact night-coverage [email or web address] Password [email or web address] 12/28/2015, 12:14 PM

## 2015-12-29 DIAGNOSIS — I10 Essential (primary) hypertension: Secondary | ICD-10-CM

## 2015-12-29 DIAGNOSIS — I739 Peripheral vascular disease, unspecified: Secondary | ICD-10-CM

## 2015-12-29 DIAGNOSIS — R2990 Unspecified symptoms and signs involving the nervous system: Secondary | ICD-10-CM | POA: Insufficient documentation

## 2015-12-29 LAB — PROTIME-INR
INR: 1.55 — ABNORMAL HIGH (ref 0.00–1.49)
Prothrombin Time: 18.7 seconds — ABNORMAL HIGH (ref 11.6–15.2)

## 2015-12-29 LAB — GLUCOSE, CAPILLARY
GLUCOSE-CAPILLARY: 95 mg/dL (ref 65–99)
GLUCOSE-CAPILLARY: 156 mg/dL — AB (ref 65–99)
Glucose-Capillary: 103 mg/dL — ABNORMAL HIGH (ref 65–99)
Glucose-Capillary: 200 mg/dL — ABNORMAL HIGH (ref 65–99)

## 2015-12-29 LAB — HEPARIN LEVEL (UNFRACTIONATED): Heparin Unfractionated: 0.27 IU/mL — ABNORMAL LOW (ref 0.30–0.70)

## 2015-12-29 MED ORDER — WARFARIN SODIUM 5 MG PO TABS
5.0000 mg | ORAL_TABLET | Freq: Once | ORAL | Status: AC
  Administered 2015-12-29: 5 mg via ORAL
  Filled 2015-12-29: qty 1

## 2015-12-29 NOTE — Progress Notes (Signed)
Patient Name: Oscar Castillo Date of Encounter: 12/29/2015  Principal Problem:   New onset a-fib [facility]) Active Problems:   Essential hypertension   GERD   Hyperlipidemia   Chronic combined systolic (EF [id number]) and grade 2diastolic congestive heart failure   ESRD on hemodialysis (HCC)   Glaucoma   LBBB (left bundle branch block)   Confusion   Length of Stay: 2  SUBJECTIVE  Feeling much better. Depressed and impatient and wanted to go home, but after some discussion regarding his cardiac problems, he has agreed to stay.  CURRENT MEDS . aspirin EC  81 mg Oral Daily  . atorvastatin  10 mg Oral q1800  . calcium acetate  2,001 mg Oral TID WC  . cinacalcet  30 mg Oral QPM  . diltiazem  120 mg Oral Daily  . insulin aspart  0-15 Units Subcutaneous TID WC  . insulin aspart  0-5 Units Subcutaneous QHS  . latanoprost  1 drop Both Eyes QHS  . metoprolol tartrate  12.5 mg Oral QID  . pantoprazole  40 mg Oral Daily  . warfarin  5 mg Oral ONCE-1800  . Warfarin - Pharmacist Dosing Inpatient   Does not apply q1800    OBJECTIVE   Intake/Output Summary (Last 24 hours) at 12/29/15 0925 Last data filed at 12/28/15 2233  Gross per 24 hour  Intake 1020.13 ml  Output      0 ml  Net 1020.13 ml   Filed Weights   12/28/15 0835 12/28/15 1206 12/29/15 0405  Weight: 84.1 kg (185 lb 6.5 oz) 83.8 kg (184 lb 11.9 oz) 85.9 kg (189 lb 6 oz)    PHYSICAL EXAM Filed Vitals:   12/28/15 1635 12/28/15 2000 12/29/15 0044 12/29/15 0405  BP: 124/76 124/75 131/68 148/77  Pulse: 81 80 77 74  Temp: 98.2 F (36.8 C) 98.5 F (36.9 C) 98 F (36.7 C) 98.2 F (36.8 C)  TempSrc: Oral Oral Oral Oral  Resp: 18 18 14 15   Height:      Weight:    85.9 kg (189 lb 6 oz)  SpO2: 100% 100% 100% 100%   oropharynx Neck: normal jugular venous pulsations and no hepatojugular reflux; brisk carotid pulses without delay and no carotid bruits Chest: clear to auscultation, no signs of consolidation by percussion or  palpation, normal fremitus, symmetrical and full respiratory excursions Cardiovascular: normal position and quality of the apical impulse, regular rhythm, normal first and second heart sounds, no rubs or gallops, 2/6 Ao ejection murmur Abdomen: no tenderness or distention, no masses by palpation, no abnormal pulsatility or arterial bruits, normal bowel sounds, no hepatosplenomegaly Extremities: no clubbing, cyanosis or edema; 2+ radial, ulnar and brachial pulses bilaterally; no subclavian or femoral bruits. Left upper arm AV fistula with good thrill. B BKA. Neurological: grossly nonfocal LABS  CBC  Recent Labs  12/27/15 0141 12/28/15 0844  WBC 7.8 5.8  HGB 13.4 10.9*  HCT 41.9 33.5*  MCV 90.5 88.2  PLT 236 [id number]   Basic Metabolic Panel  Recent Labs  12/26/15 2050 12/27/15 0141 12/28/15 0420  NA 134*  --  130*  K 4.1  --  4.2  CL 95*  --  92*  CO2 25  --  24  GLUCOSE 147*  --  119*  BUN 32*  --  59*  CREATININE 6.67*  --  9.44*  CALCIUM 9.1  --  8.7*  MG  --  2.2  --    Liver Function Tests  Recent Labs  12/28/15  0420  AST 9*  ALT 8*  ALKPHOS 36*  BILITOT 0.7  PROT 6.5  ALBUMIN 2.9*   No results for input(s): LIPASE, AMYLASE in the last 72 hours. Cardiac Enzymes  Recent Labs  12/27/15 0141 12/27/15 0905 12/27/15 1451  TROPONINI 0.05* 0.05* 0.05*    Radiology Studies Imaging results have been reviewed and Ct Head Wo Contrast  12/27/2015  CLINICAL DATA:  Left eye droop and hand weakness EXAM: CT HEAD WITHOUT CONTRAST TECHNIQUE: Contiguous axial images were obtained from the base of the skull through the vertex without intravenous contrast. COMPARISON:  None. FINDINGS: Brain: No evidence of acute infarction, hemorrhage, extra-axial collection, ventriculomegaly, or mass effect. Vascular: No hyperdense vessel or unexpected calcification. Skull: Negative for fracture or focal lesion. Sinuses/Orbits: No acute findings. Other: Mild white matter changes. IMPRESSION: 1.  No acute abnormalities identified to explain the patient's symptoms. No bleed or acute ischemia is seen. Findings called to Dr. Silverio Decamp at 2:18 p.m. Electronically Signed   By: Dorise Bullion III M.D   On: 12/27/2015 14:21   Mr Brain Wo Contrast  12/27/2015  CLINICAL DATA:  Stroke. Altered mental status following hemodialysis. Code stroke. EXAM: MRI HEAD WITHOUT CONTRAST TECHNIQUE: Multiplanar, multiecho pulse sequences of the brain and surrounding structures were obtained without intravenous contrast. COMPARISON:  CT head without contrast from the same day. MRI brain 10/19/2013. FINDINGS: The diffusion-weighted images demonstrate no evidence for acute or subacute infarction. Moderate atrophy and white matter disease is similar to the prior study. No acute hemorrhage or mass lesion is present. The ventricles are proportionate to the degree of atrophy. No significant extra-axial fluid collection is present. Remote lacunar infarcts are present in the pons. Cerebellar atrophy is again seen. Flow is present in the major intracranial arteries. A left lens replacement is present. The globes and orbits are otherwise intact. Mild mucosal thickening is present within the anterior ethmoid air cells and noted left greater than right frontal sinus. There is no fluid. The mastoid air cells are clear. Skullbase is within normal limits. Midline sagittal images demonstrate degenerative change in the upper cervical spine. Atrophy of the posterior corpus callosum is similar to the prior study. IMPRESSION: 1. No acute intracranial abnormality. 2. Stable moderate generalized atrophy and diffuse white matter disease. 3. Remote lacunar infarcts of the brainstem are stable. 4. Mild mucosal disease within the anterior ethmoid air cells and left frontal sinus. Electronically Signed   By: San Morelle M.D.   On: 12/27/2015 20:31     ASSESSMENT AND PLAN  1. New onset Afib: first documented event, not clear that the  arrhythmia was responsible for his symptoms, rate was easily controlled with diltiazem. Left atrium is not dilated and LVEF is normal. Antiarrhythmics do not appear justified. Long term anticoagulation is recommended. Switched amlodipine to diltiazem for rate control with breakthrough AF. This patients CHA2DS2-VASc Score and unadjusted Ischemic Stroke Rate (% per year) is equal to 3.2 % stroke rate/year from a score of 3. Above score calculated as 1 point each if present [CHF, HTN, DM, Vascular=MI/PAD/Aortic Plaque, Age if 65-74, or Male], 2 points each if present [Age > 75, or Stroke/TIA/TE]. For long term coumadin  2. New LBBB Echo without regional wall motion abnormalities. No angina. High risk for CAD. Recommend lexiscan Myoview tomorrow (in HD today)  3. Chronic diastolic CHF: Echo pending this admission. EF 55-60% in 2015. Volume managed per HD.No signs of volume overload by exam or echo   4. HTN: Home amlodipine and  hydralazine held. Start diltiazem.  5. HLD: LDL is 111, on low intensity statin. Increase atorvastatin to 40mg  with history of DM and PAD. Goal LDL< 70  6. Elevated troponin: minimal and with flat trend, not consistent with an acute ischemic event.   Sanda Klein, MD, [facility] [phone] office [phone] pager 12/29/2015 9:25 AM

## 2015-12-29 NOTE — Progress Notes (Signed)
PROGRESS NOTE  Oscar Castillo  [name] DOB: 12-20-1950  DOA: 12/26/2015 PCP: [name], MD   Brief Narrative:  65 y.o. male with medical history significant of ESRD on TTS hemodialysis, type 2 diabetes, hypertension, depression, GERD, history of hepatitis C, anemia, BPH, chronic combined diastolic and systolic CHF with last ejection fraction measured at 45%, b/l BKA who comes to the emergency department with complaints of feeling very tired and fatigued after he came home from hemodialysis. Workup in the emergency department showed a mildly elevated troponin level, EKG showing atrial fibrillation with RVR, mild vascular congestion on chest x-ray. Cardiology consulted. Reverted to sinus rhythm. Awaiting stress test-postponed to 6/5.  Assessment & Plan:   Principal Problem:   New onset a-fib ([place]) Active Problems:   Essential hypertension   GERD   Hyperlipidemia   Chronic combined systolic (EF [id number]) and grade 2diastolic congestive heart failure   ESRD on hemodialysis (HCC)   Glaucoma   LBBB (left bundle branch block)   Confusion   New onset A. fib with RVR - Admitted to stepdown unit. Initiated on diltiazem drip & once went regular rate was controlled, Cardizem drip was discontinued. Home labetalol was changed to metoprolol 12.5 MG 4 times a day. Cardizem CD 120 MG added on 6/3. - CHA2-DS2-VASC Score: At least 3. Currently on IV heparin bridging (ESRD/HD patient) and Coumadin. INR 1.55. - 2-D echo: Normal LVEF and TSH-0.48 in 05/2015. Repeat. - Cardiology consultation and follow-up appreciated >not clear that the arrhythmia was responsible for his symptoms. Discontinued amlodipine and hydralazine-were not started since admission.   New LBBB - Echo without regional wall motion abnormalities and no chest pain. As per cardiology, high risk for CAD and recommend Lexiscan Myoview, for 12/30/15.  Essential hypertension - Mildly uncontrolled. Labetalol, amlodipine and hydralazine  discontinued and patient started on metoprolol & diltiazem 120 MG daily. Controlled.  GERD - PPI  Hyperlipidemia - Continue atorvastatin.  Chronic combined systolic and diastolic CHF - Repeat 2-D echo. Volume management across HD. Seems compensated. However chest x-ray suggests mild pulmonary congestion.  ESRD on TTS HD - Nephrology consulted for dialysis needs. Had HD on 6/3.   Type II DM/IDDM -Continue SSI. Good inpatient control.  Elevated troponin - Minimal and flat trend. Likely secondary to ESRD and demand ischemia from RVR. Cardiology input pending. For [employer] on 6/5.   S/P b/l BKA.  Transient altered mental status/strokelike symptoms on 6/2 - Neurology consulted. CT head and MRI brain without acute findings. Resolved. Monitor. Being anticoagulated for new onset A. fib.      DVT prophylaxis: IV heparin bridging and Coumadin. Code Status: Full Family Communication: Discussed with patient and spouse at bedside.  Disposition Plan: DC home when medically stable, possibly 6/5 pending negative stress test and therapeutic INR.   Consultants:   Cardiology  Nephrology  Procedures:   HD  Antimicrobials:   None    Subjective: Patient wants to go home. Denies chest pain. Reluctantly agreeable to stay for stress test tomorrow. Also counseled that he may have to stay longer because INR may not be therapeutic tomorrow.  Objective:  Filed Vitals:   12/28/15 1635 12/28/15 2000 12/29/15 0044 12/29/15 0405  BP: 124/76 124/75 131/68 148/77  Pulse: 81 80 77 74  Temp: 98.2 F (36.8 C) 98.5 F (36.9 C) 98 F (36.7 C) 98.2 F (36.8 C)  TempSrc: Oral Oral Oral Oral  Resp: 18 18 14 15   Height:      Weight:  85.9 kg (189 lb 6 oz)  SpO2: 100% 100% 100% 100%    Intake/Output Summary (Last 24 hours) at 12/29/15 1149 Last data filed at 12/28/15 2233  Gross per 24 hour  Intake 1020.13 ml  Output      0 ml  Net 1020.13 ml   Filed Weights   12/28/15 0835  12/28/15 1206 12/29/15 0405  Weight: 84.1 kg (185 lb 6.5 oz) 83.8 kg (184 lb 11.9 oz) 85.9 kg (189 lb 6 oz)    Examination:  General exam: Pleasant middle-aged male lying comfortably supine in bed. Respiratory system: Clear to auscultation. Respiratory effort normal. Cardiovascular system: S1 & S2 heard, irregularly irregular. No JVD, murmurs, rubs, gallops or clicks. Telemetry: SR. Gastrointestinal system: Abdomen is nondistended, soft and nontender. No organomegaly or masses felt. Normal bowel sounds heard. Central nervous system: Alert and oriented. No focal neurological deficits. Extremities: Symmetric 5 x 5 power. Healed stumps of bilateral BKA. Skin: No rashes, lesions or ulcers Psychiatry: Judgement and insight appear normal. Mood & affect appropriate.     Data Reviewed: I have personally reviewed following labs and imaging studies  CBC:  Recent Labs Lab 12/26/15 2050 12/27/15 0141 12/28/15 0844  WBC 6.4 7.8 5.8  HGB 12.8* 13.4 10.9*  HCT 39.6 41.9 33.5*  MCV 87.8 90.5 88.2  PLT 239 236 [id number]   Basic Metabolic Panel:  Recent Labs Lab 12/26/15 2050 12/27/15 0141 12/28/15 0420  NA 134*  --  130*  K 4.1  --  4.2  CL 95*  --  92*  CO2 25  --  24  GLUCOSE 147*  --  119*  BUN 32*  --  59*  CREATININE 6.67*  --  9.44*  CALCIUM 9.1  --  8.7*  MG  --  2.2  --    GFR: Estimated Creatinine Clearance: 8.2 mL/min (by C-G formula based on Cr of 9.44). Liver Function Tests:  Recent Labs Lab 12/28/15 0420  AST 9*  ALT 8*  ALKPHOS 36*  BILITOT 0.7  PROT 6.5  ALBUMIN 2.9*   No results for input(s): LIPASE, AMYLASE in the last 168 hours. No results for input(s): AMMONIA in the last 168 hours. Coagulation Profile:  Recent Labs Lab 12/26/15 2150 12/27/15 0141 12/28/15 0420 12/29/15 0317  INR 1.19 1.10 1.36 1.55*   Cardiac Enzymes:  Recent Labs Lab 12/26/15 2136 12/27/15 0141 12/27/15 0905 12/27/15 1451  TROPONINI 0.05* 0.05* 0.05* 0.05*   BNP (last  3 results) No results for input(s): PROBNP in the last 8760 hours. HbA1C: No results for input(s): HGBA1C in the last 72 hours. CBG:  Recent Labs Lab 12/28/15 1259 12/28/15 1611 12/28/15 2042 12/29/15 0724 12/29/15 1124  GLUCAP 100* 166* 107* 95 156*   Lipid Profile: No results for input(s): CHOL, HDL, LDLCALC, TRIG, CHOLHDL, LDLDIRECT in the last 72 hours. Thyroid Function Tests: No results for input(s): TSH, T4TOTAL, FREET4, T3FREE, THYROIDAB in the last 72 hours. Anemia Panel: No results for input(s): VITAMINB12, FOLATE, FERRITIN, TIBC, IRON, RETICCTPCT in the last 72 hours.  Sepsis Labs: No results for input(s): PROCALCITON, LATICACIDVEN in the last 168 hours.  Recent Results (from the past 240 hour(s))  MRSA PCR Screening     Status: None   Collection Time: 12/27/15  1:45 AM  Result Value Ref Range Status   MRSA by PCR NEGATIVE NEGATIVE Final    Comment:        The GeneXpert MRSA Assay (FDA approved for NASAL specimens only), is one component of  a comprehensive MRSA colonization surveillance program. It is not intended to diagnose MRSA infection nor to guide or monitor treatment for MRSA infections.          Radiology Studies: Ct Head Wo Contrast  12/27/2015  CLINICAL DATA:  Left eye droop and hand weakness EXAM: CT HEAD WITHOUT CONTRAST TECHNIQUE: Contiguous axial images were obtained from the base of the skull through the vertex without intravenous contrast. COMPARISON:  None. FINDINGS: Brain: No evidence of acute infarction, hemorrhage, extra-axial collection, ventriculomegaly, or mass effect. Vascular: No hyperdense vessel or unexpected calcification. Skull: Negative for fracture or focal lesion. Sinuses/Orbits: No acute findings. Other: Mild white matter changes. IMPRESSION: 1. No acute abnormalities identified to explain the patient's symptoms. No bleed or acute ischemia is seen. Findings called to Dr. Silverio Decamp at 2:18 p.m. Electronically Signed   By: Dorise Bullion III M.D   On: 12/27/2015 14:21   Mr Brain Wo Contrast  12/27/2015  CLINICAL DATA:  Stroke. Altered mental status following hemodialysis. Code stroke. EXAM: MRI HEAD WITHOUT CONTRAST TECHNIQUE: Multiplanar, multiecho pulse sequences of the brain and surrounding structures were obtained without intravenous contrast. COMPARISON:  CT head without contrast from the same day. MRI brain 10/19/2013. FINDINGS: The diffusion-weighted images demonstrate no evidence for acute or subacute infarction. Moderate atrophy and white matter disease is similar to the prior study. No acute hemorrhage or mass lesion is present. The ventricles are proportionate to the degree of atrophy. No significant extra-axial fluid collection is present. Remote lacunar infarcts are present in the pons. Cerebellar atrophy is again seen. Flow is present in the major intracranial arteries. A left lens replacement is present. The globes and orbits are otherwise intact. Mild mucosal thickening is present within the anterior ethmoid air cells and noted left greater than right frontal sinus. There is no fluid. The mastoid air cells are clear. Skullbase is within normal limits. Midline sagittal images demonstrate degenerative change in the upper cervical spine. Atrophy of the posterior corpus callosum is similar to the prior study. IMPRESSION: 1. No acute intracranial abnormality. 2. Stable moderate generalized atrophy and diffuse white matter disease. 3. Remote lacunar infarcts of the brainstem are stable. 4. Mild mucosal disease within the anterior ethmoid air cells and left frontal sinus. Electronically Signed   By: San Morelle M.D.   On: 12/27/2015 20:31        Scheduled Meds: . aspirin EC  81 mg Oral Daily  . atorvastatin  10 mg Oral q1800  . calcium acetate  2,001 mg Oral TID WC  . cinacalcet  30 mg Oral QPM  . diltiazem  120 mg Oral Daily  . insulin aspart  0-15 Units Subcutaneous TID WC  . insulin aspart  0-5 Units  Subcutaneous QHS  . latanoprost  1 drop Both Eyes QHS  . metoprolol tartrate  12.5 mg Oral QID  . pantoprazole  40 mg Oral Daily  . warfarin  5 mg Oral ONCE-1800  . Warfarin - Pharmacist Dosing Inpatient   Does not apply q1800   Continuous Infusions: . sodium chloride 11 mL/hr at 12/28/15 0611  . heparin 950 Units/hr (12/29/15 0814)     LOS: 2 days    Time spent: 20 minutes.    [facility], MD [facility] Pager [phone]  If 7PM-7AM, please contact night-coverage [email or web address] Password [email or web address] 12/29/2015, 11:49 AM

## 2015-12-29 NOTE — Progress Notes (Signed)
Interval History:                                                                                                                      Oscar Castillo is an 65 y.o. male patient with   medical history significant of ESRD on hemodialysis, type 2 diabetes, hypertension, depression, GERD, history of hepatitis C, anemia, BPH, chronic combined diastolic and systolic CHF with last ejection fraction measured at 45%, admitted with new-onset atrial fibrillation, currently on Coumadin.  She has transient symptoms of confusion, speech problems unable to answer questions appropriately noted by nurse, on 12/27/15, stroke workup with brain MRI at the time showed no evidence of acute infarct. Echocardiogram showed EF in the range of 50-55%, no evidence of thrombus.  He remains stable from neurological standpoint, no new neurological symptoms. He does have bilateral BKA due to diabetic complications.    Past Medical History: Past Medical History  Diagnosis Date  . ESRD on hemodialysis ([place]) 05/05/2007    ESRD due to DM/HTN. Started dialysis in November 2013.  HD TTS at [facility] on [address].  [name] BACK PAIN, LUMBAR, CHRONIC 08/06/2009  . BENIGN PROSTATIC HYPERTROPHY 08/01/2010  . CEREBROVASCULAR ACCIDENT, HX OF 08/06/2009  . CHOLELITHIASIS 08/01/2010  . CONGESTIVE HEART FAILURE 03/18/2009  . DEPRESSION 03/18/2009  . DIABETES MELLITUS, TYPE II 03/25/2007  . ERECTILE DYSFUNCTION 03/25/2007  . GERD 03/25/2007  . HEPATITIS C, HX OF 03/25/2007  . HYPERTENSION 03/25/2007  . Morbid obesity ([place]) 03/25/2007  . NEPHROLITHIASIS, HX OF 03/18/2009  . Complication of anesthesia     wife states pt had trouble waking up with his last surgery in Nov., 2014  . History of Clostridium difficile   . Hemorrhoids   . Anemia   . Antral ulcer 2014    small    Past Surgical History  Procedure Laterality Date  . Nephrectomy      partial RR  . Av fistula placement  06/14/2012    Procedure: ARTERIOVENOUS (AV) FISTULA CREATION;  Surgeon:  Angelia Mould, MD;  Location: [facility] OR;  Service: Vascular;  Laterality: Left;  Left basilic vein transposition with fistula.  . Tibia im nail insertion Left 09/09/2012    Procedure: INTRAMEDULLARY (IM) NAIL TIBIAL;  Surgeon: Johnny Bridge, MD;  Location: [place];  Service: Orthopedics;  Laterality: Left;  left tibial nail and open reduction internal fixation left fibula fracture  . Orif fibula fracture Left 09/09/2012    Procedure: OPEN REDUCTION INTERNAL FIXATION (ORIF) FIBULA FRACTURE;  Surgeon: Johnny Bridge, MD;  Location: [place];  Service: Orthopedics;  Laterality: Left;  . Colonoscopy N/A 10/28/2012    Procedure: COLONOSCOPY;  Surgeon: Jeryl Columbia, MD;  Location: [facility] ENDOSCOPY;  Service: Endoscopy;  Laterality: N/A;  . Esophagogastroduodenoscopy N/A 11/02/2012    Procedure: ESOPHAGOGASTRODUODENOSCOPY (EGD);  Surgeon: Cleotis Nipper, MD;  Location: [facility] ENDOSCOPY;  Service: Endoscopy;  Laterality: N/A;  . Colonoscopy N/A 11/02/2012    Procedure: COLONOSCOPY;  Surgeon: Herbie Baltimore  Ursula Beath, MD;  Location: [place] ENDOSCOPY;  Service: Endoscopy;  Laterality: N/A;  . Colonoscopy N/A 11/03/2012    Procedure: COLONOSCOPY;  Surgeon: Cleotis Nipper, MD;  Location: [facility] ENDOSCOPY;  Service: Endoscopy;  Laterality: N/A;  . Givens capsule study N/A 11/04/2012    Procedure: GIVENS CAPSULE STUDY;  Surgeon: Cleotis Nipper, MD;  Location: [facility] ENDOSCOPY;  Service: Endoscopy;  Laterality: N/A;  . Enteroscopy N/A 11/08/2012    Procedure: ENTEROSCOPY;  Surgeon: Wonda Horner, MD;  Location: [facility] ENDOSCOPY;  Service: Endoscopy;  Laterality: N/A;  . Amputation Left 05/12/2013    Procedure: AMPUTATION RAY;  Surgeon: Newt Minion, MD;  Location: [place];  Service: Orthopedics;  Laterality: Left;  Left Foot 1st Ray Amputation  . Eye surgery Left     to remove scar tissue  . Amputation Left 06/09/2013    Procedure: AMPUTATION BELOW KNEE;  Surgeon: Newt Minion, MD;  Location: [place];  Service: Orthopedics;  Laterality: Left;   Left Below Knee Amputation and removal proximal screws IM tibial nail  . Hardware removal Left 06/09/2013    Procedure: HARDWARE REMOVAL;  Surgeon: Newt Minion, MD;  Location: [place];  Service: Orthopedics;  Laterality: Left;  Left Below Knee Amputation  and Removal proximal screws IM tibial nail  . Amputation Right 09/08/2013    Procedure: AMPUTATION BELOW KNEE;  Surgeon: Newt Minion, MD;  Location: [place];  Service: Orthopedics;  Laterality: Right;  Right Below Knee Amputation  . Amputation Right 10/11/2013    Procedure: AMPUTATION BELOW KNEE;  Surgeon: Newt Minion, MD;  Location: [place];  Service: Orthopedics;  Laterality: Right;  Right Below Knee Amputation Revision    Family History: Family History  Problem Relation Age of Onset  . Coronary artery disease Other   . Diabetes Mother     Social History:   reports that he has never smoked. He has never used smokeless tobacco. He reports that he does not drink alcohol or use illicit drugs.  Allergies:  No Known Allergies   Medications:                                                                                                                         Current facility-administered medications:  .  0.9 %  sodium chloride infusion, , Intravenous, Continuous, Ivah Girardot Fuller Mandril, MD, Last Rate: 11 mL/hr at 12/28/15 [id number] .  acetaminophen (TYLENOL) tablet 500 mg, 500 mg, Oral, Q6H PRN, Reubin [name], MD .  ALPRAZolam Duanne Moron) tablet 0.25 mg, 0.25 mg, Oral, TID PRN, Reubin [name], MD, 0.25 mg at 12/27/15 0252 .  aspirin EC tablet 81 mg, 81 mg, Oral, Daily, Reubin [name], MD, 81 mg at 12/29/15 [id number] .  atorvastatin (LIPITOR) tablet 10 mg, 10 mg, Oral, q1800, Reubin [name], MD, 10 mg at 12/29/15 1726 .  calcium acetate (PHOSLO) capsule 2,001 mg, 2,001 mg, Oral, TID WC, Reubin [name], MD, 2,001 mg at 12/29/15  1726 .  cinacalcet (SENSIPAR) tablet 30 mg, 30 mg, Oral, QPM, Reubin [name], MD, 30 mg at  12/29/15 1726 .  diltiazem (CARDIZEM CD) 24 hr capsule 120 mg, 120 mg, Oral, Daily, Modena Jansky, MD, 120 mg at 12/29/15 0814 .  heparin ADULT infusion 100 units/mL (25000 units/236mL sodium chloride 0.45%), 950 Units/hr, Intravenous, Continuous, Modena Jansky, MD, Last Rate: 9.5 mL/hr at 12/29/15 0814, 950 Units/hr at 12/29/15 0814 .  hydrocerin (EUCERIN) cream 1 application, 1 application, Topical, PRN, Reubin [name], MD .  hydrOXYzine (ATARAX/VISTARIL) tablet 25 mg, 25 mg, Oral, Q6H PRN, Reubin [name], MD .  insulin aspart (novoLOG) injection 0-15 Units, 0-15 Units, Subcutaneous, TID WC, Hewitt Shorts Harduk, PA-C, 3 Units at 12/29/15 1144 .  insulin aspart (novoLOG) injection 0-5 Units, 0-5 Units, Subcutaneous, QHS, Hewitt Shorts Harduk, PA-C, 0 Units at 12/27/15 2200 .  latanoprost (XALATAN) 0.005 % ophthalmic solution 1 drop, 1 drop, Both Eyes, QHS, Reubin [name], MD, 1 drop at 12/28/15 2232 .  metoprolol tartrate (LOPRESSOR) tablet 12.5 mg, 12.5 mg, Oral, QID, Dorris Carnes V, MD, 12.5 mg at 12/29/15 1726 .  ondansetron (ZOFRAN) tablet 4 mg, 4 mg, Oral, Q6H PRN **OR** ondansetron (ZOFRAN) injection 4 mg, 4 mg, Intravenous, Q6H PRN, Reubin [name], MD .  pantoprazole (PROTONIX) EC tablet 40 mg, 40 mg, Oral, Daily, Reubin [name], MD, 40 mg at 12/29/15 [id number] .  Warfarin - Pharmacist Dosing Inpatient, , Does not apply, q1800, Jake Church Masters, [facility]   Neurologic Examination:                                                                                                     Today's Vitals   12/29/15 0044 12/29/15 0405 12/29/15 0815 12/29/15 1428  BP: 131/68 148/77  137/75  Pulse: 77 74  75  Temp: 98 F (36.7 C) 98.2 F (36.8 C)  98.9 F (37.2 C)  TempSrc: Oral Oral  Oral  Resp: 14 15  18   Height:      Weight:  85.9 kg (189 lb 6 oz)    SpO2: 100% 100%  100%  PainSc:   0-No pain     Evaluation of higher integrative functions including: Level of alertness: Alert,   Oriented to time, place and person Speech: fluent, no evidence of dysarthria or aphasia noted.  Test the following cranial nerves: 2-12 grossly intact Motor examination: Normal tone, bulk, full 5/5 motor strength in Bilateral upper extremities and in bilateral proximal lower extremities, status post bilateral BKA  Test coordination: Normal finger nose testing, with no evidence of limb appendicular ataxia or abnormal involuntary movements or tremors noted.     Lab Results: Basic Metabolic Panel:  Recent Labs Lab 12/26/15 2050 12/27/15 0141 12/28/15 0420  NA 134*  --  130*  K 4.1  --  4.2  CL 95*  --  92*  CO2 25  --  24  GLUCOSE 147*  --  119*  BUN 32*  --  59*  CREATININE 6.67*  --  9.44*  CALCIUM 9.1  --  8.7*  MG  --  2.2  --     Liver Function Tests:  Recent Labs Lab 12/28/15 0420  AST 9*  ALT 8*  ALKPHOS 36*  BILITOT 0.7  PROT 6.5  ALBUMIN 2.9*   No results for input(s): LIPASE, AMYLASE in the last 168 hours. No results for input(s): AMMONIA in the last 168 hours.  CBC:  Recent Labs Lab 12/26/15 2050 12/27/15 0141 12/28/15 0844  WBC 6.4 7.8 5.8  HGB 12.8* 13.4 10.9*  HCT 39.6 41.9 33.5*  MCV 87.8 90.5 88.2  PLT 239 236 205    Cardiac Enzymes:  Recent Labs Lab 12/26/15 2136 12/27/15 0141 12/27/15 0905 12/27/15 1451  TROPONINI 0.05* 0.05* 0.05* 0.05*    Lipid Panel: No results for input(s): CHOL, TRIG, HDL, CHOLHDL, VLDL, LDLCALC in the last 168 hours.  CBG:  Recent Labs Lab 12/28/15 1611 12/28/15 2042 12/29/15 0724 12/29/15 1124 12/29/15 1624  GLUCAP 166* 107* 95 156* 103*    Microbiology: Results for orders placed or performed during the hospital encounter of 12/26/15  MRSA PCR Screening     Status: None   Collection Time: 12/27/15  1:45 AM  Result Value Ref Range Status   MRSA by PCR NEGATIVE NEGATIVE Final    Comment:        The GeneXpert MRSA Assay (FDA approved for NASAL specimens only), is one component of  a comprehensive MRSA colonization surveillance program. It is not intended to diagnose MRSA infection nor to guide or monitor treatment for MRSA infections.     Imaging: Dg Chest 2 View  12/26/2015  CLINICAL DATA:  Acute onset of generalized weakness and vomiting. Initial encounter. EXAM: CHEST  2 VIEW COMPARISON:  Chest radiograph performed 10/22/2013 FINDINGS: The lungs are well-aerated. Mild vascular congestion is noted. There is no evidence of focal opacification, pleural effusion or pneumothorax. The heart is normal in size; the mediastinal contour is within normal limits. No acute osseous abnormalities are seen. A vascular stent is noted at the left arm. IMPRESSION: Mild vascular congestion noted.  Lungs remain grossly clear. Electronically Signed   By: Garald Balding M.D.   On: 12/26/2015 22:29   Ct Head Wo Contrast  12/27/2015  CLINICAL DATA:  Left eye droop and hand weakness EXAM: CT HEAD WITHOUT CONTRAST TECHNIQUE: Contiguous axial images were obtained from the base of the skull through the vertex without intravenous contrast. COMPARISON:  None. FINDINGS: Brain: No evidence of acute infarction, hemorrhage, extra-axial collection, ventriculomegaly, or mass effect. Vascular: No hyperdense vessel or unexpected calcification. Skull: Negative for fracture or focal lesion. Sinuses/Orbits: No acute findings. Other: Mild white matter changes. IMPRESSION: 1. No acute abnormalities identified to explain the patient's symptoms. No bleed or acute ischemia is seen. Findings called to Dr. Silverio Decamp at 2:18 p.m. Electronically Signed   By: Dorise Bullion III M.D   On: 12/27/2015 14:21   Mr Brain Wo Contrast  12/27/2015  CLINICAL DATA:  Stroke. Altered mental status following hemodialysis. Code stroke. EXAM: MRI HEAD WITHOUT CONTRAST TECHNIQUE: Multiplanar, multiecho pulse sequences of the brain and surrounding structures were obtained without intravenous contrast. COMPARISON:  CT head without contrast from  the same day. MRI brain 10/19/2013. FINDINGS: The diffusion-weighted images demonstrate no evidence for acute or subacute infarction. Moderate atrophy and white matter disease is similar to the prior study. No acute hemorrhage or mass lesion is present. The ventricles are proportionate to the degree of atrophy. No significant extra-axial fluid collection is present. Remote lacunar infarcts are  present in the pons. Cerebellar atrophy is again seen. Flow is present in the major intracranial arteries. A left lens replacement is present. The globes and orbits are otherwise intact. Mild mucosal thickening is present within the anterior ethmoid air cells and noted left greater than right frontal sinus. There is no fluid. The mastoid air cells are clear. Skullbase is within normal limits. Midline sagittal images demonstrate degenerative change in the upper cervical spine. Atrophy of the posterior corpus callosum is similar to the prior study. IMPRESSION: 1. No acute intracranial abnormality. 2. Stable moderate generalized atrophy and diffuse white matter disease. 3. Remote lacunar infarcts of the brainstem are stable. 4. Mild mucosal disease within the anterior ethmoid air cells and left frontal sinus. Electronically Signed   By: San Morelle M.D.   On: 12/27/2015 20:31    Assessment and plan:   Oscar Castillo is an 65 y.o. male patient with   medical history significant of ESRD on hemodialysis, type 2 diabetes, hypertension, depression, GERD, history of hepatitis C, anemia, BPH, chronic combined diastolic and systolic CHF with last ejection fraction measured at 45%, admitted with new-onset atrial fibrillation, currently on Coumadin.  She has transient symptoms of confusion, speech problems unable to answer questions appropriately noted by nurse, on 12/27/15, stroke workup with brain MRI at the time showed no evidence of acute infarct. Echocardiogram showed EF in the range of 50-55%, no evidence of  thrombus.  He remains stable from neurological standpoint, no new neurological symptoms. He does have bilateral BKA due to diabetic complications. Otherwise nonfocal examination.  No further recommendations from neurology service. We'll sign off. Please call if any further questions.

## 2015-12-29 NOTE — Progress Notes (Signed)
PT Cancellation Note  Patient Details Name: ROMANDO MELAHN MRN: [id number] DOB: 1950-11-19   Cancelled Treatment:    Reason Eval/Treat Not Completed: PT screened, no needs identified, will sign off -- Pt has all DME, wife helps with any needs, pt works out regularly and has residual limbs inspected in dialysis, denies problems with balance or with prosthetic fit and reports no worries or concerns about mobility.  Pt instructed to notify MD/RN if any changes.  Thank you,    Herbie Drape 12/29/2015, 12:08 PM

## 2015-12-29 NOTE — Progress Notes (Signed)
ANTICOAGULATION CONSULT NOTE - Follow Up Consult  Pharmacy Consult for Heparin and warfarin Indication: atrial fibrillation  No Known Allergies  Patient Measurements: Height: 5\' 10"  (177.8 cm) Weight: 189 lb 6 oz (85.9 kg) IBW/kg (Calculated) : 73 Heparin Dosing Weight:   Vital Signs: Temp: 98.2 F (36.8 C) (06/04 0405) Temp Source: Oral (06/04 0405) BP: 148/77 mmHg (06/04 0405) Pulse Rate: 74 (06/04 0405)  Labs:  Recent Labs  12/26/15 2050  12/27/15 0141 12/27/15 0905 12/27/15 1451 12/28/15 0420 12/28/15 0844 12/28/15 1342 12/29/15 0317  HGB 12.8*  --  13.4  --   --   --  10.9*  --   --   HCT 39.6  --  41.9  --   --   --  33.5*  --   --   PLT 239  --  236  --   --   --  205  --   --   LABPROT  --   < > 14.4  --   --  16.9*  --   --  18.7*  INR  --   < > 1.10  --   --  1.36  --   --  1.55*  HEPARINUNFRC  --   --   --   --   --  0.24*  --  0.37 0.27*  CREATININE 6.67*  --   --   --   --  9.44*  --   --   --   TROPONINI  --   < > 0.05* 0.05* 0.05*  --   --   --   --   < > = values in this interval not displayed.  Estimated Creatinine Clearance: 8.2 mL/min (by C-G formula based on Cr of 9.44).   Medical History: Past Medical History  Diagnosis Date  . ESRD on hemodialysis ([place]) 05/05/2007    ESRD due to DM/HTN. Started dialysis in November 2013.  HD TTS at [facility] on [address].  [name] BACK PAIN, LUMBAR, CHRONIC 08/06/2009  . BENIGN PROSTATIC HYPERTROPHY 08/01/2010  . CEREBROVASCULAR ACCIDENT, HX OF 08/06/2009  . CHOLELITHIASIS 08/01/2010  . CONGESTIVE HEART FAILURE 03/18/2009  . DEPRESSION 03/18/2009  . DIABETES MELLITUS, TYPE II 03/25/2007  . ERECTILE DYSFUNCTION 03/25/2007  . GERD 03/25/2007  . HEPATITIS C, HX OF 03/25/2007  . HYPERTENSION 03/25/2007  . Morbid obesity ([place]) 03/25/2007  . NEPHROLITHIASIS, HX OF 03/18/2009  . Complication of anesthesia     wife states pt had trouble waking up with his last surgery in Nov., 2014  . History of Clostridium difficile   .  Hemorrhoids   . Anemia   . Antral ulcer 2014    small    Medications:  Prescriptions prior to admission  Medication Sig Dispense Refill Last Dose  . acetaminophen (TYLENOL) 500 MG tablet Take 500 mg by mouth every 6 (six) hours as needed for mild pain.   4 months  . amLODipine (NORVASC) 10 MG tablet TAKE 1 TABLET BY MOUTH DAILY (Patient taking differently: TAKE 1 TABLET BY MOUTH every evening) 90 tablet 0 12/25/2015 at Unknown time  . aspirin EC 81 MG tablet Take 81 mg by mouth daily.   12/26/2015 at 1200  . atorvastatin (LIPITOR) 10 MG tablet Take 1 tablet (10 mg total) by mouth daily. (Patient taking differently: Take 10 mg by mouth daily at 6 PM. ) 90 tablet 3 12/25/2015 at Unknown time  . calcium acetate (PHOSLO) 667 MG capsule Take 2 capsules (1,334 mg  total) by mouth 3 (three) times daily with meals. (Patient taking differently: Take 2,001 mg by mouth 3 (three) times daily with meals. ) 180 capsule 0 12/26/2015 at Unknown time  . hydrALAZINE (APRESOLINE) 100 MG tablet Take 1 tablet (100 mg total) by mouth every 8 (eight) hours. 90 tablet 11 12/26/2015 at Unknown time  . hydrOXYzine (ATARAX/VISTARIL) 25 MG tablet Take 1 tablet (25 mg total) by mouth every 6 (six) hours as needed. (Patient taking differently: Take 25 mg by mouth every 6 (six) hours as needed for itching. ) 40 tablet 2 12/25/2015 at Unknown time  . ketoconazole (NIZORAL) 2 % cream Apply 1 application topically daily. 30 g 1 12/25/2015 at Unknown time  . labetalol (NORMODYNE) 300 MG tablet Take 1 tablet (300 mg total) by mouth 3 (three) times daily. (Patient taking differently: Take 300 mg by mouth 2 (two) times daily. ) 90 tablet 11 12/26/2015 at 1200  . latanoprost (XALATAN) 0.005 % ophthalmic solution place 1 drop into right eye at bedtime  0 12/25/2015 at Unknown time  . lidocaine-prilocaine (EMLA) cream APPLY A SMALL AMOUNT TO SKIN AT THE ACCESS SITE (AVF) AS DIRECTED BEFORE EACH DIALYSIS SESSION THREE DAYS A WEEK  6 12/26/2015 at Unknown  time  . PREVIDENT 5000 PLUS 1.1 % CREA dental cream Place 1 application onto teeth every morning.   6 12/26/2015 at Unknown time  . SENSIPAR 30 MG tablet Take 30 mg by mouth every evening.    12/25/2015 at Unknown time  . silver sulfADIAZINE (SILVADENE) 1 % cream Apply 1 application topically daily.   12/26/2015 at Unknown time  . Skin Protectants, Misc. (EUCERIN) cream Apply 1 application topically as needed for dry skin.   12/26/2015 at Unknown time  . VOLTAREN 1 % GEL APPLY 2 GRAMS TO HANDS BID TO TID  0 12/25/2015 at Unknown time    Assessment: 65 yo male presents with fatigue, also is ESRD on HD, found to have new onset AFib. Discussed oral anticoagulants with provider, warfarin is best option due to ESRD. CHADSVASc =5. Warfarin 5mg x3 given INR 1.1>1.5 CBC stable continue Heparin drip 900 uts/hr HL 0.37 at goal yesterday fell overnight at same rate 0.27 will increase slightly    Goal of Therapy:  Heparin level 0.3-0.7 INR 2-3 Monitor platelets by anticoagulation protocol: Yes    Plan:  Increase Heparin drip 950 uts/hr -Warfarin 5 mg po x1 - repeat -Daily INR, CBC q72h   Bonnita Nasuti Pharm.D. CPP, BCPS Clinical Pharmacist [phone] 12/29/2015 7:44 AM

## 2015-12-29 NOTE — Progress Notes (Signed)
  [facility] Progress Note   Subjective: no c/o's, for cardiac stress tomorrow  Filed Vitals:   12/28/15 1635 12/28/15 2000 12/29/15 0044 12/29/15 0405  BP: 124/76 124/75 131/68 148/77  Pulse: 81 80 77 74  Temp: 98.2 F (36.8 C) 98.5 F (36.9 C) 98 F (36.7 C) 98.2 F (36.8 C)  TempSrc: Oral Oral Oral Oral  Resp: 18 18 14 15   Height:      Weight:    85.9 kg (189 lb 6 oz)  SpO2: 100% 100% 100% 100%    Inpatient medications: . aspirin EC  81 mg Oral Daily  . atorvastatin  10 mg Oral q1800  . calcium acetate  2,001 mg Oral TID WC  . cinacalcet  30 mg Oral QPM  . diltiazem  120 mg Oral Daily  . insulin aspart  0-15 Units Subcutaneous TID WC  . insulin aspart  0-5 Units Subcutaneous QHS  . latanoprost  1 drop Both Eyes QHS  . metoprolol tartrate  12.5 mg Oral QID  . pantoprazole  40 mg Oral Daily  . warfarin  5 mg Oral ONCE-1800  . Warfarin - Pharmacist Dosing Inpatient   Does not apply q1800   . sodium chloride 11 mL/hr at 12/28/15 [id number]  . heparin 950 Units/hr (12/29/15 0814)   acetaminophen, ALPRAZolam, hydrocerin, hydrOXYzine, ondansetron **OR** ondansetron (ZOFRAN) IV  Exam: Exam Alert, calm, no distress No rash, cyanosis or gangrene Sclera anicteric, throat clear  No jvd or bruits  Chest clear bilat Cor irreg irreg no mrg Abd soft ntnd no mass or ascites +bs  GU normal male MS bilat BKA, no wounds or edema Neuro is alert, Ox 3   Dialysis: TTS [place] 3.5h 82.5kg 2/2.5 bath P4 Hep 7000 LUA AVF Hect 5 Ven 50/wk Mirc 50 q 4 last 5/16    Assessment: 1 Afib w RVR - new onset, on po MTP/ dilt now and IV hep w coumadin started. Per cards needs stress test tomorrow.  ECHO normal, trop's up but flat, prob d/t esrd. Back in NSR.  2  ESRD usual HD TTS, has not missed HD 3  PVD bilat BKA 4  Hx CVA 5  HTN stable bp's 6  Vol 3kg up by wts, exam neg  Plan - HD TTS, stress test Monday     Kelly Splinter MD [facility] pager  [phone]    cell [phone] 12/29/2015, 1:17 PM    Recent Labs Lab 12/26/15 2050 12/28/15 0420  NA 134* 130*  K 4.1 4.2  CL 95* 92*  CO2 25 24  GLUCOSE 147* 119*  BUN 32* 59*  CREATININE 6.67* 9.44*  CALCIUM 9.1 8.7*    Recent Labs Lab 12/28/15 0420  AST 9*  ALT 8*  ALKPHOS 36*  BILITOT 0.7  PROT 6.5  ALBUMIN 2.9*    Recent Labs Lab 12/26/15 2050 12/27/15 0141 12/28/15 0844  WBC 6.4 7.8 5.8  HGB 12.8* 13.4 10.9*  HCT 39.6 41.9 33.5*  MCV 87.8 90.5 88.2  PLT 239 236 205

## 2015-12-30 ENCOUNTER — Inpatient Hospital Stay (HOSPITAL_COMMUNITY): Payer: Medicare Other

## 2015-12-30 DIAGNOSIS — I110 Hypertensive heart disease with heart failure: Secondary | ICD-10-CM

## 2015-12-30 DIAGNOSIS — R079 Chest pain, unspecified: Secondary | ICD-10-CM

## 2015-12-30 DIAGNOSIS — I5042 Chronic combined systolic (congestive) and diastolic (congestive) heart failure: Secondary | ICD-10-CM

## 2015-12-30 LAB — CBC
HCT: 31.8 % — ABNORMAL LOW (ref 39.0–52.0)
HEMOGLOBIN: 10.3 g/dL — AB (ref 13.0–17.0)
MCH: 28 pg (ref 26.0–34.0)
MCHC: 32.4 g/dL (ref 30.0–36.0)
MCV: 86.4 fL (ref 78.0–100.0)
Platelets: 189 10*3/uL (ref 150–400)
RBC: 3.68 MIL/uL — ABNORMAL LOW (ref 4.22–5.81)
RDW: 15.1 % (ref 11.5–15.5)
WBC: 5.8 10*3/uL (ref 4.0–10.5)

## 2015-12-30 LAB — NM MYOCAR MULTI W/SPECT W/WALL MOTION / EF
CHL CUP MPHR: 156 {beats}/min
CHL CUP RESTING HR STRESS: 75 {beats}/min
CSEPHR: 55 %
CSEPPHR: 86 {beats}/min
Estimated workload: 1 METS

## 2015-12-30 LAB — PROTIME-INR
INR: 2.07 — AB (ref 0.00–1.49)
PROTHROMBIN TIME: 23.1 s — AB (ref 11.6–15.2)

## 2015-12-30 LAB — GLUCOSE, CAPILLARY
GLUCOSE-CAPILLARY: 88 mg/dL (ref 65–99)
Glucose-Capillary: 81 mg/dL (ref 65–99)
Glucose-Capillary: 138 mg/dL — ABNORMAL HIGH (ref 65–99)
Glucose-Capillary: 105 mg/dL — ABNORMAL HIGH (ref 65–99)

## 2015-12-30 LAB — HEPARIN LEVEL (UNFRACTIONATED): HEPARIN UNFRACTIONATED: 0.66 [IU]/mL (ref 0.30–0.70)

## 2015-12-30 MED ORDER — OFF THE BEAT BOOK
Freq: Once | Status: DC
  Filled 2015-12-30: qty 1

## 2015-12-30 MED ORDER — WARFARIN SODIUM 2.5 MG PO TABS
2.5000 mg | ORAL_TABLET | Freq: Once | ORAL | Status: AC
  Administered 2015-12-30: 2.5 mg via ORAL
  Filled 2015-12-30: qty 1

## 2015-12-30 MED ORDER — METOPROLOL TARTRATE 50 MG PO TABS
50.0000 mg | ORAL_TABLET | Freq: Two times a day (BID) | ORAL | Status: DC
  Administered 2015-12-30: 50 mg via ORAL
  Filled 2015-12-30: qty 1

## 2015-12-30 MED ORDER — TECHNETIUM TC 99M TETROFOSMIN IV KIT
30.0000 | PACK | Freq: Once | INTRAVENOUS | Status: AC | PRN
  Administered 2015-12-30: 30 via INTRAVENOUS

## 2015-12-30 MED ORDER — REGADENOSON 0.4 MG/5ML IV SOLN
INTRAVENOUS | Status: AC
  Filled 2015-12-30: qty 5

## 2015-12-30 MED ORDER — TECHNETIUM TC 99M TETROFOSMIN IV KIT
10.0000 | PACK | Freq: Once | INTRAVENOUS | Status: AC | PRN
  Administered 2015-12-30: 10 via INTRAVENOUS

## 2015-12-30 MED ORDER — REGADENOSON 0.4 MG/5ML IV SOLN
0.4000 mg | Freq: Once | INTRAVENOUS | Status: DC
  Filled 2015-12-30: qty 5

## 2015-12-30 NOTE — Plan of Care (Signed)
Problem: Health Behavior/Discharge Planning: Goal: Ability to manage health-related needs will improve Outcome: Progressing Assess needs for Discharge to home.

## 2015-12-30 NOTE — Evaluation (Signed)
Occupational Therapy Evaluation and Discharge Patient Details Name: Oscar Castillo MRN: [id number] DOB: July 27, 1951 Today's Date: 12/30/2015    History of Present Illness Pt admitted with new onset a fib. PMH: ESRD, DM, HTN, depression, CHF, B BKA.   Clinical Impression   Pt uses B prostheses with walker and is assisted by his wife for sponge bathing and dressing. Pt reports not being OOB since admission. Educated pt on benefits of OOB/sitting up daily. Sat pt at EOB x 10 minutes while performing grooming and applying lotion to pt's back. Did not transfer pt as he was to go for in bed. Pt is without concerns related to ADL and IADL and has all necessary equipment in his home. Signing off.    Follow Up Recommendations  No OT follow up    Equipment Recommendations  None recommended by OT    Recommendations for Other Services       Precautions / Restrictions Precautions Precautions: Fall Restrictions Weight Bearing Restrictions: No Other Position/Activity Restrictions: prosthesis not in room      Mobility Bed Mobility Overal bed mobility: Needs Assistance Bed Mobility: Supine to Sit;Sit to Supine     Supine to sit: Mod assist Sit to supine: Supervision   General bed mobility comments: assist for trunk  Transfers                      Balance Overall balance assessment: Needs assistance   Sitting balance-Leahy Scale: Fair                                      ADL Overall ADL's : At baseline                                       General ADL Comments: Did not transfer pt this visit in the absence of his prostheses and with plan for stress test soon.     Vision     Perception     Praxis      Pertinent Vitals/Pain Pain Assessment: No/denies pain     Hand Dominance Right   Extremity/Trunk Assessment Upper Extremity Assessment Upper Extremity Assessment: Overall WFL for tasks assessed   Lower Extremity  Assessment Lower Extremity Assessment:  (B BKA)   Cervical / Trunk Assessment Cervical / Trunk Assessment: Kyphotic   Communication Communication Communication: No difficulties   Cognition Arousal/Alertness: Awake/alert Behavior During Therapy: Flat affect Overall Cognitive Status: Within Functional Limits for tasks assessed                     General Comments       Exercises       Shoulder Instructions      Home Living Family/patient expects to be discharged to:: Private residence Living Arrangements: Spouse/significant other Available Help at Discharge: Family;Available 24 hours/day Type of Home: House       Home Layout: One level     Bathroom Shower/Tub: Teacher, early years/pre: Standard     Home Equipment: Environmental consultant - 2 wheels;Wheelchair - manual;Bedside commode          Prior Functioning/Environment Level of Independence: Needs assistance  Gait / Transfers Assistance Needed: walks with RW and B LE prosthesis, uses w/c also ADL's / Homemaking Assistance Needed: wife assists with ADL and IADL,  car transfers   Comments: pt goes to the [facility] to exercise x 2/week    OT Diagnosis: Generalized weakness   OT Problem List:     OT Treatment/Interventions:      OT Goals(Current goals can be found in the care plan section) Acute Rehab OT Goals Patient Stated Goal: go home  OT Frequency:     Barriers to D/C:            Co-evaluation              End of Session    Activity Tolerance: Patient tolerated treatment well Patient left: in bed;with call bell/phone within reach   Time: 1050-1105 OT Time Calculation (min): 15 min Charges:  OT General Charges $OT Visit: 1 Procedure OT Evaluation $OT Eval Moderate Complexity: 1 Procedure G-Codes:    Malka So 12/30/2015, 1:32 PM  [phone]

## 2015-12-30 NOTE — Care Management Important Message (Signed)
Important Message  Patient Details  Name: Oscar Castillo MRN: [id number] Date of Birth: 1951-07-07   Medicare Important Message Given:  Yes    [name] May 12/30/2015, 12:07 PM

## 2015-12-30 NOTE — Progress Notes (Signed)
  [facility] Progress Note   Subjective: no c/o's, for cardiac stress   Filed Vitals:   12/29/15 0405 12/29/15 1428 12/29/15 2024 12/30/15 0500  BP: 148/77 137/75 149/80 156/82  Pulse: 74 75 73 74  Temp: 98.2 F (36.8 C) 98.9 F (37.2 C) 98.6 F (37 C) 98.3 F (36.8 C)  TempSrc: Oral Oral Oral Oral  Resp: 15 18 18 18   Height:      Weight: 85.9 kg (189 lb 6 oz)   83.825 kg (184 lb 12.8 oz)  SpO2: 100% 100% 100% 100%    Inpatient medications: . aspirin EC  81 mg Oral Daily  . atorvastatin  10 mg Oral q1800  . calcium acetate  2,001 mg Oral TID WC  . cinacalcet  30 mg Oral QPM  . diltiazem  120 mg Oral Daily  . insulin aspart  0-15 Units Subcutaneous TID WC  . insulin aspart  0-5 Units Subcutaneous QHS  . latanoprost  1 drop Both Eyes QHS  . metoprolol tartrate  50 mg Oral BID  . off the beat book   Does not apply Once  . pantoprazole  40 mg Oral Daily  . Warfarin - Pharmacist Dosing Inpatient   Does not apply q1800   . heparin 950 Units/hr (12/29/15 2324)   acetaminophen, ALPRAZolam, hydrocerin, hydrOXYzine, ondansetron **OR** ondansetron (ZOFRAN) IV  Exam: Exam Alert, calm, no distress No rash, cyanosis or gangrene Sclera anicteric, throat clear  No jvd or bruits  Chest clear bilat Cor RRR no mrg Abd soft ntnd no mass or ascites +bs  GU normal male MS bilat BKA, no wounds or edema Neuro is alert, Ox 3   Dialysis: TTS [place] 3.5h 82.5kg 2/2.5 bath P4 Hep 7000 LUA AVF Hect 5 Ven 50/wk Mirc 50 q 4 last 5/16    Assessment: 1 Afib w RVR - new onset, on po MTP/ dilt now and IV hep w coumadin started.ST today.  ECHO normal 2  ESRD usual HD TTS, has not missed HD 3  PVD bilat BKA 4  Hx CVA 5  HTN stable bp's 6  Vol 3kg up by wts, use outpt EDW  Plan - HD TTS, stress test today  Pearson Grippe MD [facility] 12/30/2015, 9:48 AM    Recent Labs Lab 12/26/15 2050 12/28/15 0420  NA 134* 130*  K 4.1 4.2  CL 95* 92*   CO2 25 24  GLUCOSE 147* 119*  BUN 32* 59*  CREATININE 6.67* 9.44*  CALCIUM 9.1 8.7*    Recent Labs Lab 12/28/15 0420  AST 9*  ALT 8*  ALKPHOS 36*  BILITOT 0.7  PROT 6.5  ALBUMIN 2.9*    Recent Labs Lab 12/27/15 0141 12/28/15 0844 12/30/15 0436  WBC 7.8 5.8 5.8  HGB 13.4 10.9* 10.3*  HCT 41.9 33.5* 31.8*  MCV 90.5 88.2 86.4  PLT 236 205 189

## 2015-12-30 NOTE — Progress Notes (Signed)
PATIENT ID:  [id number] with hypertension, hyperlipidemia, chronic systolic and diastolic heart failure (LVEF [id number], grade 2 diastolic dysfunction), new LBBB, ESRD on dialysis here with a new diagnosis of paroxysmal atrial fibrillation.    SUBJECTIVE:  Oscar Castillo is feeling well.  He notes fatigue but is otherwise without complaint.    PHYSICAL EXAM Filed Vitals:   12/29/15 0405 12/29/15 1428 12/29/15 2024 12/30/15 0500  BP: 148/77 137/75 149/80 156/82  Pulse: 74 75 73 74  Temp: 98.2 F (36.8 C) 98.9 F (37.2 C) 98.6 F (37 C) 98.3 F (36.8 C)  TempSrc: Oral Oral Oral Oral  Resp: 15 18 18 18   Height:      Weight: 189 lb 6 oz (85.9 kg)   184 lb 12.8 oz (83.825 kg)  SpO2: 100% 100% 100% 100%   General:  Well-appearing.  No acute distress Neck: No JVD Lungs:  CTAB.  No crackles, rhonchi or wheezes Heart:  RRR. No m/r/g.  Normal S1/S2 Abdomen:  Soft.  NT, ND.  +BS Extremities:  Bilateral BKA.  No edema.   LABS: Lab Results  Component Value Date   TROPONINI 0.05* 12/27/2015   Results for orders placed or performed during the hospital encounter of 12/26/15 (from the past 24 hour(s))  Glucose, capillary     Status: Abnormal   Collection Time: 12/29/15 11:24 AM  Result Value Ref Range   Glucose-Capillary 156 (H) 65 - 99 mg/dL   Comment 1 Notify RN    Comment 2 Document in Chart   Glucose, capillary     Status: Abnormal   Collection Time: 12/29/15  4:24 PM  Result Value Ref Range   Glucose-Capillary 103 (H) 65 - 99 mg/dL   Comment 1 Notify RN    Comment 2 Document in Chart   Glucose, capillary     Status: Abnormal   Collection Time: 12/29/15  9:34 PM  Result Value Ref Range   Glucose-Capillary 200 (H) 65 - 99 mg/dL  CBC     Status: Abnormal   Collection Time: 12/30/15  4:36 AM  Result Value Ref Range   WBC 5.8 4.0 - 10.5 K/uL   RBC 3.68 (L) 4.22 - 5.81 MIL/uL   Hemoglobin 10.3 (L) 13.0 - 17.0 g/dL   HCT 31.8 (L) 39.0 - 52.0 %   MCV 86.4 78.0 - 100.0 fL   MCH 28.0 26.0 -  34.0 pg   MCHC 32.4 30.0 - 36.0 g/dL   RDW 15.1 11.5 - 15.5 %   Platelets 189 150 - 400 K/uL  Heparin level (unfractionated)     Status: None   Collection Time: 12/30/15  5:25 AM  Result Value Ref Range   Heparin Unfractionated 0.66 0.30 - 0.70 IU/mL  Protime-INR     Status: Abnormal   Collection Time: 12/30/15  5:25 AM  Result Value Ref Range   Prothrombin Time 23.1 (H) 11.6 - 15.2 seconds   INR 2.07 (H) 0.00 - 1.49  Glucose, capillary     Status: None   Collection Time: 12/30/15  7:55 AM  Result Value Ref Range   Glucose-Capillary 81 65 - 99 mg/dL    Intake/Output Summary (Last 24 hours) at 12/30/15 [id number] Last data filed at 12/29/15 2324  Gross per 24 hour  Intake 1169.76 ml  Output      0 ml  Net 1169.76 ml    Telemetry:  No events  ASSESSMENT AND PLAN:  Principal Problem:   New onset a-fib (HCC) Active Problems:  Essential hypertension   GERD   Hyperlipidemia   Chronic combined systolic (EF [id number]) and grade 2diastolic congestive heart failure   ESRD on hemodialysis (HCC)   Glaucoma   LBBB (left bundle branch block)   Confusion   Stroke-like symptoms   # Paroxysmal atrial fibrillation, new onset: Oscar Castillo remains in sinus rhythm.  Continue diltiazem for rate control. Continue anticoagulation with warfarin. This patients CHA2DS2-VASc Score and unadjusted Ischemic Stroke Rate (% per year) is equal to 3.2 % stroke rate/year from a score of 3 Above score calculated as 1 point each if present [CHF, HTN, DM, Vascular=MI/PAD/Aortic Plaque, Age if 65-74, or Male] Above score calculated as 2 points each if present [Age > 75, or Stroke/TIA/TE]   # LBBB:  # Elevated troponin: This was diagnosed this admission. Echo was without wall motion abnormalities. Lexiscan Myoview pending today.     # Hypertension: Blood pressure has been above goal.  His home antihypertensives were held due to atrial fibrillation and he was started on diltiazem and metoprolol this admission.  We  will consolidate metoprolol from 12.5mg  qid to 50 mg bid.  Continue diltiazem 120 mg daily.  If necessary, we will restart amlodipine.   # Hyperlipidemia: LDL was 111 12/11/15.  He will need repeat lipids and LFTs in 2 weeks.  Continue atorvastatin.    Kesi Perrow C. Oval Linsey, MD, [facility] 12/30/2015 8:21 AM

## 2015-12-30 NOTE — Progress Notes (Addendum)
ANTICOAGULATION CONSULT NOTE - Follow Up Consult  Pharmacy Consult for Heparin and warfarin Indication: atrial fibrillation  No Known Allergies  Patient Measurements: Height: 5\' 10"  (177.8 cm) Weight: 184 lb 12.8 oz (83.825 kg) IBW/kg (Calculated) : 73 Heparin Dosing Weight:   Vital Signs: Temp: 98.3 F (36.8 C) (06/05 0500) Temp Source: Oral (06/05 0500) BP: 156/82 mmHg (06/05 0500) Pulse Rate: 74 (06/05 0500)  Labs:  Recent Labs  12/27/15 1451  12/28/15 0420 12/28/15 0844 12/28/15 1342 12/29/15 0317 12/30/15 0436 12/30/15 0525  HGB  --   --   --  10.9*  --   --  10.3*  --   HCT  --   --   --  33.5*  --   --  31.8*  --   PLT  --   --   --  205  --   --  189  --   LABPROT  --   --  16.9*  --   --  18.7*  --  23.1*  INR  --   --  1.36  --   --  1.55*  --  2.07*  HEPARINUNFRC  --   < > 0.24*  --  0.37 0.27*  --  0.66  CREATININE  --   --  9.44*  --   --   --   --   --   TROPONINI 0.05*  --   --   --   --   --   --   --   < > = values in this interval not displayed.  Estimated Creatinine Clearance: 8.2 mL/min (by C-G formula based on Cr of 9.44).   Medical History: Past Medical History  Diagnosis Date  . ESRD on hemodialysis ([place]) 05/05/2007    ESRD due to DM/HTN. Started dialysis in November 2013.  HD TTS at [facility] on [address].  [name] BACK PAIN, LUMBAR, CHRONIC 08/06/2009  . BENIGN PROSTATIC HYPERTROPHY 08/01/2010  . CEREBROVASCULAR ACCIDENT, HX OF 08/06/2009  . CHOLELITHIASIS 08/01/2010  . CONGESTIVE HEART FAILURE 03/18/2009  . DEPRESSION 03/18/2009  . DIABETES MELLITUS, TYPE II 03/25/2007  . ERECTILE DYSFUNCTION 03/25/2007  . GERD 03/25/2007  . HEPATITIS C, HX OF 03/25/2007  . HYPERTENSION 03/25/2007  . Morbid obesity ([place]) 03/25/2007  . NEPHROLITHIASIS, HX OF 03/18/2009  . Complication of anesthesia     wife states pt had trouble waking up with his last surgery in Nov., 2014  . History of Clostridium difficile   . Hemorrhoids   . Anemia   . Antral ulcer 2014    small    Medications:  Prescriptions prior to admission  Medication Sig Dispense Refill Last Dose  . acetaminophen (TYLENOL) 500 MG tablet Take 500 mg by mouth every 6 (six) hours as needed for mild pain.   4 months  . amLODipine (NORVASC) 10 MG tablet TAKE 1 TABLET BY MOUTH DAILY (Patient taking differently: TAKE 1 TABLET BY MOUTH every evening) 90 tablet 0 12/25/2015 at Unknown time  . aspirin EC 81 MG tablet Take 81 mg by mouth daily.   12/26/2015 at 1200  . atorvastatin (LIPITOR) 10 MG tablet Take 1 tablet (10 mg total) by mouth daily. (Patient taking differently: Take 10 mg by mouth daily at 6 PM. ) 90 tablet 3 12/25/2015 at Unknown time  . calcium acetate (PHOSLO) 667 MG capsule Take 2 capsules (1,334 mg total) by mouth 3 (three) times daily with meals. (Patient taking differently: Take 2,001 mg by mouth  3 (three) times daily with meals. ) 180 capsule 0 12/26/2015 at Unknown time  . hydrALAZINE (APRESOLINE) 100 MG tablet Take 1 tablet (100 mg total) by mouth every 8 (eight) hours. 90 tablet 11 12/26/2015 at Unknown time  . hydrOXYzine (ATARAX/VISTARIL) 25 MG tablet Take 1 tablet (25 mg total) by mouth every 6 (six) hours as needed. (Patient taking differently: Take 25 mg by mouth every 6 (six) hours as needed for itching. ) 40 tablet 2 12/25/2015 at Unknown time  . ketoconazole (NIZORAL) 2 % cream Apply 1 application topically daily. 30 g 1 12/25/2015 at Unknown time  . labetalol (NORMODYNE) 300 MG tablet Take 1 tablet (300 mg total) by mouth 3 (three) times daily. (Patient taking differently: Take 300 mg by mouth 2 (two) times daily. ) 90 tablet 11 12/26/2015 at 1200  . latanoprost (XALATAN) 0.005 % ophthalmic solution place 1 drop into right eye at bedtime  0 12/25/2015 at Unknown time  . lidocaine-prilocaine (EMLA) cream APPLY A SMALL AMOUNT TO SKIN AT THE ACCESS SITE (AVF) AS DIRECTED BEFORE EACH DIALYSIS SESSION THREE DAYS A WEEK  6 12/26/2015 at Unknown time  . PREVIDENT 5000 PLUS 1.1 % CREA dental  cream Place 1 application onto teeth every morning.   6 12/26/2015 at Unknown time  . SENSIPAR 30 MG tablet Take 30 mg by mouth every evening.    12/25/2015 at Unknown time  . silver sulfADIAZINE (SILVADENE) 1 % cream Apply 1 application topically daily.   12/26/2015 at Unknown time  . Skin Protectants, Misc. (EUCERIN) cream Apply 1 application topically as needed for dry skin.   12/26/2015 at Unknown time  . VOLTAREN 1 % GEL APPLY 2 GRAMS TO HANDS BID TO TID  0 12/25/2015 at Unknown time    Assessment: 65 yo male presents with fatigue, also is ESRD on HD, found to have new onset AFib. Oral anticoagulants was discussed with provider, warfarin is best option due to ESRD. CHADSVASc =5.  INR 2.07, therapeutic x 1 this morning, now on day # 4/5 of bridge. INR had a significant increase overnight, will give lower dose today expecting it will continue to increase tomorrow. Heparin drip 950 units/hr, HL 0.66 therapeutic this morning at higher end of range, decrease slightly to keep in range -Hgb/plt has continued to trend down, currently no signs/report of bleeding per RN, continue to monitor  Goal of Therapy:  Heparin level 0.3-0.7 INR 2-3 Monitor platelets by anticoagulation protocol: Yes    Plan:  -Decrease heparin drip to 900 units/hr x 1 more day to complete 5 day bridge -Warfarin 2.5 mg po x1 -Daily INR, CBC q72h -Monitor clinical course, s/sx of bleed, PO intake, DDI   Thank you for allowing [place] to participate in this patients care. Jens Som, PharmD Pager: [phone]  12/30/2015 10:32 AM

## 2015-12-30 NOTE — Progress Notes (Signed)
PROGRESS NOTE  Oscar Castillo  [name] DOB: 1951-04-09  DOA: 12/26/2015 PCP: [name], MD   Brief Narrative:  65 y.o. male with medical history significant of ESRD on TTS hemodialysis, type 2 diabetes, hypertension, depression, GERD, history of hepatitis C, anemia, BPH, chronic combined diastolic and systolic CHF with last ejection fraction measured at 45%, b/l BKA who comes to the emergency department with complaints of feeling very tired and fatigued after he came home from hemodialysis. Workup in the emergency department showed a mildly elevated troponin level, EKG showing atrial fibrillation with RVR, mild vascular congestion on chest x-ray. Cardiology consulted. Reverted to sinus rhythm. Awaiting stress test-postponed to 6/5.  Assessment & Plan:   Principal Problem:   New onset a-fib ([place]) Active Problems:   Essential hypertension   GERD   Hyperlipidemia   Chronic combined systolic (EF [id number]) and grade 2diastolic congestive heart failure   ESRD on hemodialysis (HCC)   Glaucoma   LBBB (left bundle branch block)   Confusion   Stroke-like symptoms   New onset paroxysmal atrial fibrillation - Admitted to stepdown unit. Initiated on diltiazem drip & once went regular rate was controlled, Cardizem drip was discontinued. Home labetalol was changed to metoprolol. Cardizem CD 120 MG added on 6/3. - CHA2-DS2-VASC Score: At least 3. Currently on IV heparin bridging (ESRD/HD patient) and Coumadin. INR 1.55. - 2-D echo: Normal LVEF and TSH-0.48 in 05/2015. Repeat. - Cardiology consultation and follow-up appreciated >not clear that the arrhythmia was responsible for his symptoms. Discontinued amlodipine and hydralazine-were not started since admission.  - Remains in sinus rhythm. Continue diltiazem CD and metoprolol. INR has rapidly short up to 2.07. DC heparin. Follow INR in a.m.  New LBBB - Echo without regional wall motion abnormalities and no chest pain. As per cardiology, high risk  for CAD and recommend Lexiscan Myoview, for 12/30/15.  Essential hypertension - Mildly uncontrolled. Labetalol, amlodipine and hydralazine discontinued and patient started on metoprolol & diltiazem 120 MG daily. Controlled.  GERD - PPI  Hyperlipidemia - Continue atorvastatin. Repeat fasting lipids and LFTs in 2 weeks. LDL 111 on 5/17.  Chronic combined systolic and diastolic CHF - Repeat 2-D echo results as below. Volume management across HD. Seems compensated.   ESRD on TTS HD - Nephrology consulted for dialysis needs. Had HD on 6/3. Next HD on 6/6. Patient prefers to complete inpatient dialysis prior to discharge since it's already late post stress test today.  Type II DM/IDDM -Continue SSI. Good inpatient control.  Elevated troponin - Minimal and flat trend. Likely secondary to ESRD and demand ischemia from RVR. For [employer] on 6/5.   S/P b/l BKA.  Transient altered mental status/strokelike symptoms on 6/2 - Neurology consulted. CT head and MRI brain without acute findings. Resolved. Monitor. Being anticoagulated for new onset A. fib.      DVT prophylaxis: IV heparin bridging and Coumadin. Code Status: Full Family Communication: Discussed with patient and spouse at bedside.  Disposition Plan: DC home when medically stable, possibly 6/6 pending negative stress test and completion of hemodialysis.   Consultants:   Cardiology  Nephrology  Procedures:   HD  Antimicrobials:   None    Subjective: Seen prior to stress test this morning. Denied complaints. No chest pain or dyspnea.  Objective:  Filed Vitals:   12/30/15 1306 12/30/15 1307 12/30/15 1309 12/30/15 1503  BP: 120/65 131/65 119/64 145/71  Pulse: 85 86 85 75  Temp:    98.2 F (36.8 C)  TempSrc:  Oral  Resp:    18  Height:      Weight:      SpO2:    100%    Intake/Output Summary (Last 24 hours) at 12/30/15 1654 Last data filed at 12/29/15 2324  Gross per 24 hour  Intake 929.76 ml    Output      0 ml  Net 929.76 ml   Filed Weights   12/28/15 1206 12/29/15 0405 12/30/15 0500  Weight: 83.8 kg (184 lb 11.9 oz) 85.9 kg (189 lb 6 oz) 83.825 kg (184 lb 12.8 oz)    Examination:  General exam: Pleasant middle-aged male lying comfortably supine in bed. Respiratory system: Clear to auscultation. Respiratory effort normal. Cardiovascular system: S1 & S2 heard, irregularly irregular. No JVD, murmurs, rubs, gallops or clicks. Telemetry: SR. Gastrointestinal system: Abdomen is nondistended, soft and nontender. No organomegaly or masses felt. Normal bowel sounds heard. Central nervous system: Alert and oriented. No focal neurological deficits. Extremities: Symmetric 5 x 5 power. Healed stumps of bilateral BKA. Skin: No rashes, lesions or ulcers Psychiatry: Judgement and insight appear normal. Mood & affect appropriate.     Data Reviewed: I have personally reviewed following labs and imaging studies  CBC:  Recent Labs Lab 12/26/15 2050 12/27/15 0141 12/28/15 0844 12/30/15 0436  WBC 6.4 7.8 5.8 5.8  HGB 12.8* 13.4 10.9* 10.3*  HCT 39.6 41.9 33.5* 31.8*  MCV 87.8 90.5 88.2 86.4  PLT 239 236 205 [id number]   Basic Metabolic Panel:  Recent Labs Lab 12/26/15 2050 12/27/15 0141 12/28/15 0420  NA 134*  --  130*  K 4.1  --  4.2  CL 95*  --  92*  CO2 25  --  24  GLUCOSE 147*  --  119*  BUN 32*  --  59*  CREATININE 6.67*  --  9.44*  CALCIUM 9.1  --  8.7*  MG  --  2.2  --    GFR: Estimated Creatinine Clearance: 8.2 mL/min (by C-G formula based on Cr of 9.44). Liver Function Tests:  Recent Labs Lab 12/28/15 0420  AST 9*  ALT 8*  ALKPHOS 36*  BILITOT 0.7  PROT 6.5  ALBUMIN 2.9*   No results for input(s): LIPASE, AMYLASE in the last 168 hours. No results for input(s): AMMONIA in the last 168 hours. Coagulation Profile:  Recent Labs Lab 12/26/15 2150 12/27/15 0141 12/28/15 0420 12/29/15 0317 12/30/15 0525  INR 1.19 1.10 1.36 1.55* 2.07*   Cardiac  Enzymes:  Recent Labs Lab 12/26/15 2136 12/27/15 0141 12/27/15 0905 12/27/15 1451  TROPONINI 0.05* 0.05* 0.05* 0.05*   BNP (last 3 results) No results for input(s): PROBNP in the last 8760 hours. HbA1C: No results for input(s): HGBA1C in the last 72 hours. CBG:  Recent Labs Lab 12/29/15 1624 12/29/15 2134 12/30/15 0755 12/30/15 1123 12/30/15 1622  GLUCAP 103* 200* 81 88 138*   Lipid Profile: No results for input(s): CHOL, HDL, LDLCALC, TRIG, CHOLHDL, LDLDIRECT in the last 72 hours. Thyroid Function Tests: No results for input(s): TSH, T4TOTAL, FREET4, T3FREE, THYROIDAB in the last 72 hours. Anemia Panel: No results for input(s): VITAMINB12, FOLATE, FERRITIN, TIBC, IRON, RETICCTPCT in the last 72 hours.  Sepsis Labs: No results for input(s): PROCALCITON, LATICACIDVEN in the last 168 hours.  Recent Results (from the past 240 hour(s))  MRSA PCR Screening     Status: None   Collection Time: 12/27/15  1:45 AM  Result Value Ref Range Status   MRSA by PCR NEGATIVE NEGATIVE Final  Comment:        The GeneXpert MRSA Assay (FDA approved for NASAL specimens only), is one component of a comprehensive MRSA colonization surveillance program. It is not intended to diagnose MRSA infection nor to guide or monitor treatment for MRSA infections.          Radiology Studies: No results found.      Scheduled Meds: . aspirin EC  81 mg Oral Daily  . atorvastatin  10 mg Oral q1800  . calcium acetate  2,001 mg Oral TID WC  . cinacalcet  30 mg Oral QPM  . diltiazem  120 mg Oral Daily  . insulin aspart  0-15 Units Subcutaneous TID WC  . insulin aspart  0-5 Units Subcutaneous QHS  . latanoprost  1 drop Both Eyes QHS  . metoprolol tartrate  50 mg Oral BID  . off the beat book   Does not apply Once  . pantoprazole  40 mg Oral Daily  . regadenoson      . regadenoson  0.4 mg Intravenous Once  . warfarin  2.5 mg Oral ONCE-1800  . Warfarin - Pharmacist Dosing Inpatient    Does not apply q1800   Continuous Infusions: . heparin 900 Units/hr (12/30/15 1113)     LOS: 3 days    Time spent: 20 minutes.    [facility], MD [facility] Pager [phone]  If 7PM-7AM, please contact night-coverage [email or web address] Password [facility] 12/30/2015, 4:54 PM

## 2015-12-30 NOTE — Progress Notes (Signed)
Pharmacy Progress Note:  Since INR is now therapeutic, will d/c IV heparin and continue Coumadin monotherapy since 5 day bridge not necessary for Afib. Monitor daily INR's.    Albertina Parr, PharmD., BCPS Clinical Pharmacist Pager [phone]

## 2015-12-31 ENCOUNTER — Telehealth: Payer: Self-pay | Admitting: Physician Assistant

## 2015-12-31 DIAGNOSIS — R7989 Other specified abnormal findings of blood chemistry: Secondary | ICD-10-CM

## 2015-12-31 DIAGNOSIS — D649 Anemia, unspecified: Secondary | ICD-10-CM

## 2015-12-31 DIAGNOSIS — R778 Other specified abnormalities of plasma proteins: Secondary | ICD-10-CM

## 2015-12-31 LAB — RENAL FUNCTION PANEL
ALBUMIN: 2.9 g/dL — AB (ref 3.5–5.0)
Anion gap: 11 (ref 5–15)
BUN: 55 mg/dL — AB (ref 6–20)
CO2: 23 mmol/L (ref 22–32)
CREATININE: 8.92 mg/dL — AB (ref 0.61–1.24)
Calcium: 7.9 mg/dL — ABNORMAL LOW (ref 8.9–10.3)
Chloride: 92 mmol/L — ABNORMAL LOW (ref 101–111)
GFR, EST AFRICAN AMERICAN: 6 mL/min — AB (ref >60)
GFR, EST NON AFRICAN AMERICAN: 6 mL/min — AB (ref >60)
Glucose, Bld: 102 mg/dL — ABNORMAL HIGH (ref 65–99)
PHOSPHORUS: 4.1 mg/dL (ref 2.5–4.6)
Potassium: 5 mmol/L (ref 3.5–5.1)
Sodium: 126 mmol/L — ABNORMAL LOW (ref 135–145)

## 2015-12-31 LAB — CBC
HEMATOCRIT: 31.8 % — AB (ref 39.0–52.0)
Hemoglobin: 10.3 g/dL — ABNORMAL LOW (ref 13.0–17.0)
MCH: 27.6 pg (ref 26.0–34.0)
MCHC: 32.4 g/dL (ref 30.0–36.0)
MCV: 85.3 fL (ref 78.0–100.0)
PLATELETS: 214 10*3/uL (ref 150–400)
RBC: 3.73 MIL/uL — AB (ref 4.22–5.81)
RDW: 14.9 % (ref 11.5–15.5)
WBC: 5.2 10*3/uL (ref 4.0–10.5)

## 2015-12-31 LAB — PROTIME-INR
INR: 2.69 — AB (ref 0.00–1.49)
Prothrombin Time: 28.2 seconds — ABNORMAL HIGH (ref 11.6–15.2)

## 2015-12-31 LAB — GLUCOSE, CAPILLARY: GLUCOSE-CAPILLARY: 86 mg/dL (ref 65–99)

## 2015-12-31 MED ORDER — ATORVASTATIN CALCIUM 40 MG PO TABS
40.0000 mg | ORAL_TABLET | Freq: Every day | ORAL | Status: DC
  Filled 2015-12-31: qty 1

## 2015-12-31 MED ORDER — ATORVASTATIN CALCIUM 40 MG PO TABS: 40.0000 mg | ORAL_TABLET | Freq: Every day | ORAL | Status: DC

## 2015-12-31 MED ORDER — DILTIAZEM HCL ER COATED BEADS 120 MG PO CP24: 120.0000 mg | ORAL_CAPSULE | Freq: Every day | ORAL | Status: DC

## 2015-12-31 MED ORDER — WARFARIN SODIUM 2.5 MG PO TABS: 1.2500 mg | ORAL_TABLET | Freq: Every day | ORAL | Status: DC

## 2015-12-31 MED ORDER — AMLODIPINE BESYLATE 5 MG PO TABS
5.0000 mg | ORAL_TABLET | Freq: Every day | ORAL | Status: DC
  Administered 2015-12-31: 5 mg via ORAL
  Filled 2015-12-31: qty 1

## 2015-12-31 MED ORDER — METOPROLOL SUCCINATE ER 100 MG PO TB24
100.0000 mg | ORAL_TABLET | Freq: Every day | ORAL | Status: DC
  Administered 2015-12-31: 100 mg via ORAL
  Filled 2015-12-31: qty 1

## 2015-12-31 MED ORDER — METOPROLOL SUCCINATE ER 100 MG PO TB24: 100.0000 mg | ORAL_TABLET | Freq: Every day | ORAL | Status: DC

## 2015-12-31 MED ORDER — AMLODIPINE BESYLATE 10 MG PO TABS: 5.0000 mg | ORAL_TABLET | Freq: Every day | ORAL | Status: DC

## 2015-12-31 MED ORDER — WARFARIN SODIUM 2.5 MG PO TABS
1.2500 mg | ORAL_TABLET | Freq: Every day | ORAL | Status: DC
  Filled 2015-12-31: qty 1

## 2015-12-31 NOTE — Progress Notes (Signed)
F/u scheduled for 01/20/16 at 10:30am with me at Dr. Alan Ripper office. If patient needs Coumadin clinic visit scheduled at discharge, please contact our office to arrange. (not sure yet what IM plans regarding further monitoring i.e. with HD or not). Dayna Dunn PA-C

## 2015-12-31 NOTE — Progress Notes (Signed)
ANTICOAGULATION CONSULT NOTE - Follow Up Consult  Pharmacy Consult for Coumadin Indication: atrial fibrillation  No Known Allergies  Patient Measurements: Height: 5\' 10"  (177.8 cm) Weight: 194 lb 0.1 oz (88 kg) IBW/kg (Calculated) : 73  Vital Signs: Temp: 97 F (36.1 C) (06/06 0650) Temp Source: Oral (06/06 0650) BP: 156/90 mmHg (06/06 0930) Pulse Rate: 82 (06/06 0930)  Labs:  Recent Labs  12/28/15 1342 12/29/15 0317 12/30/15 0436 12/30/15 0525 12/31/15 0458 12/31/15 0719  HGB  --   --  10.3*  --   --  10.3*  HCT  --   --  31.8*  --   --  31.8*  PLT  --   --  189  --   --  214  LABPROT  --  18.7*  --  23.1* 28.2*  --   INR  --  1.55*  --  2.07* 2.69*  --   HEPARINUNFRC 0.37 0.27*  --  0.66  --   --   CREATININE  --   --   --   --   --  8.92*    Estimated Creatinine Clearance: 9.3 mL/min (by C-G formula based on Cr of 8.92).  Assessment: 65yom started on coumadin with heparin bridge for new afib (CHADSVASC = 5). Heparin d/c'ed yesterday with therapeutic INR. INR has risen quickly after 4 doses (1.1 > 1.36 > 1.55 > 2.07 > 2.69). CBC stable.   Goal of Therapy:  INR 2-3 Monitor platelets by anticoagulation protocol: Yes   Plan:  1) Coumadin 1.25mg  tonight 2) Can go home on 1.25mg  daily until next INR check on Thursday  Deboraha Sprang 12/31/2015,9:56 AM

## 2015-12-31 NOTE — Discharge Instructions (Signed)
Atrial Fibrillation Atrial fibrillation is a type of irregular or rapid heartbeat (arrhythmia). In atrial fibrillation, the heart quivers continuously in a chaotic pattern. This occurs when parts of the heart receive disorganized signals that make the heart unable to pump blood normally. This can increase the risk for stroke, heart failure, and other heart-related conditions. There are different types of atrial fibrillation, including:  Paroxysmal atrial fibrillation. This type starts suddenly, and it usually stops on its own shortly after it starts.  Persistent atrial fibrillation. This type often lasts longer than a week. It may stop on its own or with treatment.  Long-lasting persistent atrial fibrillation. This type lasts longer than 12 months.  Permanent atrial fibrillation. This type does not go away. Talk with your health care provider to learn about the type of atrial fibrillation that you have. CAUSES This condition is caused by some heart-related conditions or procedures, including:  A heart attack.  Coronary artery disease.  Heart failure.  Heart valve conditions.  High blood pressure.  Inflammation of the sac that surrounds the heart (pericarditis).  Heart surgery.  Certain heart rhythm disorders, such as Wolf-Parkinson-White syndrome. Other causes include:  Pneumonia.  Obstructive sleep apnea.  Blockage of an artery in the lungs (pulmonary embolism, or PE).  Lung cancer.  Chronic lung disease.  Thyroid problems, especially if the thyroid is overactive (hyperthyroidism).  Caffeine.  Excessive alcohol use or illegal drug use.  Use of some medicines, including certain decongestants and diet pills. Sometimes, the cause cannot be found. RISK FACTORS This condition is more likely to develop in:  People who are older in age.  People who smoke.  People who have diabetes mellitus.  People who are overweight (obese).  Athletes who exercise  vigorously. SYMPTOMS Symptoms of this condition include:  A feeling that your heart is beating rapidly or irregularly.  A feeling of discomfort or pain in your chest.  Shortness of breath.  Sudden light-headedness or weakness.  Getting tired easily during exercise. In some cases, there are no symptoms. DIAGNOSIS Your health care provider may be able to detect atrial fibrillation when taking your pulse. If detected, this condition may be diagnosed with:  An electrocardiogram (ECG).  A Holter monitor test that records your heartbeat patterns over a 24-hour period.  Transthoracic echocardiogram (TTE) to evaluate how blood flows through your heart.  Transesophageal echocardiogram (TEE) to view more detailed images of your heart.  A stress test.  Imaging tests, such as a CT scan or chest X-ray.  Blood tests. TREATMENT The main goals of treatment are to prevent blood clots from forming and to keep your heart beating at a normal rate and rhythm. The type of treatment that you receive depends on many factors, such as your underlying medical conditions and how you feel when you are experiencing atrial fibrillation. This condition may be treated with:  Medicine to slow down the heart rate, bring the heart's rhythm back to normal, or prevent clots from forming.  Electrical cardioversion. This is a procedure that resets your heart's rhythm by delivering a controlled, low-energy shock to the heart through your skin.  Different types of ablation, such as catheter ablation, catheter ablation with pacemaker, or surgical ablation. These procedures destroy the heart tissues that send abnormal signals. When the pacemaker is used, it is placed under your skin to help your heart beat in a regular rhythm. HOME CARE INSTRUCTIONS  Take over-the counter and prescription medicines only as told by your health care provider.    If your health care provider prescribed a blood-thinning medicine  (anticoagulant), take it exactly as told. Taking too much blood-thinning medicine can cause bleeding. If you do not take enough blood-thinning medicine, you will not have the protection that you need against stroke and other problems.  Do not use tobacco products, including cigarettes, chewing tobacco, and e-cigarettes. If you need help quitting, ask your health care provider.  If you have obstructive sleep apnea, manage your condition as told by your health care provider.  Do not drink alcohol.  Do not drink beverages that contain caffeine, such as coffee, soda, and tea.  Maintain a healthy weight. Do not use diet pills unless your health care provider approves. Diet pills may make heart problems worse.  Follow diet instructions as told by your health care provider.  Exercise regularly as told by your health care provider.  Keep all follow-up visits as told by your health care provider. This is important. PREVENTION  Avoid drinking beverages that contain caffeine or alcohol.  Avoid certain medicines, especially medicines that are used for breathing problems.  Avoid certain herbs and herbal medicines, such as those that contain ephedra or ginseng.  Do not use illegal drugs, such as cocaine and amphetamines.  Do not smoke.  Manage your high blood pressure. SEEK MEDICAL CARE IF:  You notice a change in the rate, rhythm, or strength of your heartbeat.  You are taking an anticoagulant and you notice increased bruising.  You tire more easily when you exercise or exert yourself. SEEK IMMEDIATE MEDICAL CARE IF:  You have chest pain, abdominal pain, sweating, or weakness.  You feel nauseous.  You notice blood in your vomit, bowel movement, or urine.  You have shortness of breath.  You suddenly have swollen feet and ankles.  You feel dizzy.  You have sudden weakness or numbness of the face, arm, or leg, especially on one side of the body.  You have trouble speaking,  trouble understanding, or both (aphasia).  Your face or your eyelid droops on one side. These symptoms may represent a serious problem that is an emergency. Do not wait to see if the symptoms will go away. Get medical help right away. Call your local emergency services (911 in the [place] Do not drive yourself to the hospital.   This information is not intended to replace advice given to you by your health care provider. Make sure you discuss any questions you have with your health care provider.   Document Released: 07/13/2005 Document Revised: 04/03/2015 Document Reviewed: 11/07/2014 [employer] Interactive Patient Education 2016 [employer]  

## 2015-12-31 NOTE — Discharge Summary (Signed)
Physician Discharge Summary  Oscar Castillo  [id number]  DOB: 1950/12/05  DOA: 12/26/2015  PCP: [name], MD  Admit date: 12/26/2015 Discharge date: 12/31/2015  Time spent: Greater than 30 minutes  Recommendations for Outpatient Follow-up:  1. Melina Copa, Cardiology PA-C on 01/20/16 at 10:30 AM. 2. Dr. [name], PCP in 1 week. 3. Keep regular hemodialysis appointments on Tuesdays/Thursdays/Saturdays. Needs to have labs drawn  (PT & INR) at next dialysis on 01/02/16. Results to be called to [facility] (pharmacist/Sally Putt)@[phone] for Coumadin dose adjustment. 4. Recommend repeating LFTs and fasting lipid panel in 6 weeks.  Discharge Diagnoses:  Principal Problem:   New onset a-fib [facility]) Active Problems:   Essential hypertension   GERD   Hyperlipidemia   Chronic combined systolic and diastolic congestive heart failure   ESRD on hemodialysis (HCC)   Glaucoma   LBBB (left bundle branch block)   Confusion   Stroke-like symptoms   Elevated troponin   Anemia   Discharge Condition: Improved & Stable  Diet recommendation: Heart healthy and diabetic diet.  Filed Weights   12/31/15 0500 12/31/15 0650 12/31/15 1021  Weight: 83.099 kg (183 lb 3.2 oz) 88 kg (194 lb 0.1 oz) 83.6 kg (184 lb 4.9 oz)    History of present illness:  65 y.o. male with medical history significant of ESRD on TTS hemodialysis, type 2 diabetes-diet controlled, hypertension, depression, GERD, history of hepatitis C, anemia, BPH, chronic combined diastolic and systolic CHF with last ejection fraction measured at 45%, b/l BKA who comes to the emergency department with complaints of feeling very tired and fatigued after he came home from hemodialysis. Workup in the emergency department showed a mildly elevated troponin level, EKG showing atrial fibrillation with RVR, mild vascular congestion on chest x-ray. Cardiology consulted. Reverted to sinus rhythm.  Hospital Course:   New onset  paroxysmal atrial fibrillation - Admitted to stepdown unit. Initiated on diltiazem drip & once ventricular rate was controlled, Cardizem drip was discontinued.  - Cardiology was consulted and continue to see patient. Medications were adjusted and will be on metoprolol XL, diltiazem CD.  - CHA2-DS2-VASC Score: At least 3. Initiated on heparin bridging and Coumadin until INR became therapeutic. Heparin was discontinued. Patient's INR went up rapidly. Discussed with nephrology/Dr. Joelyn Oms, cardiology/Ms Dunn and arranged for patient to have lab draws (PT & INR) at analysis with results to be forwarded to Coumadin clinic for dose adjustment. - 2-D echo: Normal LVEF and TSH-0.48 in 05/2015.  - As per cardiology, not clear that the arrhythmia was responsible for his symptoms.  - Discontinued hydralazine and labetalol  New LBBB - Echo without regional wall motion abnormalities and no chest pain. As per cardiology, high risk for CAD and underwent Lexiscan Myoview on 12/30/15. Discussed with Dr. Skeet Latch, Cardiology who has reviewed the Myoview results and recommends no further workup and has cleared patient for discharge home. LVEF by stress test was 36% but by Echo was 50-55%.  Essential hypertension - Mildly uncontrolled. Labetalol and hydralazine discontinued and patient started on metoprolol & diltiazem 120 MG daily. Amlodipine resumed at lower dose. Close monitoring as outpatient.  GERD - Asymptomatic and was not on PPI prior to admission.  Hyperlipidemia - Continue atorvastatin-dose was increased today. Repeat fasting lipids and LFTs in 6 weeks. LDL 111 on 5/17.  Chronic combined systolic and diastolic CHF - Repeat 2-D echo results as below. Volume management across HD. Seems compensated.   ESRD on TTS HD - Nephrology  consulted for dialysis needs. Had HD on 6/3 & 6/6.  - Discussed with Dr. Joelyn Oms and cleared for discharge today. - Hyponatremia likely secondary to ESRD and management  per nephrology across dialysis and can have periodic labs (renal panel) drawn at dialysis.  Type II DM/IDDM - Diet controlled at home. A1c: 6.2.  Elevated troponin - Minimal and flat trend. Likely secondary to ESRD and demand ischemia from RVR. Lexiscan results and management as indicated above.   S/P b/l BKA.  Transient altered mental status/strokelike symptoms on 6/2 - Neurology consulted. CT head and MRI brain without acute findings. Resolved. Monitor. Being anticoagulated for new onset A. fib.   Anemia in ESRD - Stable.    Consultants:   Cardiology  Nephrology  Procedures:   HD  2-D echo 12/27/15: Study Conclusions  - Left ventricle: The cavity size was normal. There was mild  concentric hypertrophy. Systolic function was normal. The  estimated ejection fraction was in the range of 50% to 55%. Wall  motion was normal; there were no regional wall motion  abnormalities. The study was not technically sufficient to allow  evaluation of LV diastolic dysfunction due to atrial  fibrillation. - Ventricular septum: Septal motion showed moderate paradox. These  changes are consistent with intraventricular conduction delay. - Aortic valve: Severe diffuse thickening and calcification. Valve  mobility was mildly restricted. - Mitral valve: There was mild regurgitation.    Discharge Exam:  Complaints: Seen at dialysis this morning. Denied complaints. No chest pain, dyspnea or palpitations.  Filed Vitals:   12/31/15 0930 12/31/15 1000 12/31/15 1016 12/31/15 1021  BP: 156/90 148/92 164/83   Pulse: 82 85 84   Temp:      TempSrc:      Resp: 16 16 15    Height:      Weight:    83.6 kg (184 lb 4.9 oz)  SpO2:      Temperature 95F, oxygen saturation 94%.  General exam: Pleasant middle-aged male lying comfortably supine in bed undergoing HD this morning. Respiratory system: Clear to auscultation. Respiratory effort normal. Cardiovascular system: S1 & S2 heard,  irregularly irregular. No JVD, murmurs, rubs, gallops or clicks.  Gastrointestinal system: Abdomen is nondistended, soft and nontender. No organomegaly or masses felt. Normal bowel sounds heard. Central nervous system: Alert and oriented. No focal neurological deficits. Extremities: Symmetric 5 x 5 power. Healed stumps of bilateral BKA. Skin: No rashes, lesions or ulcers Psychiatry: Judgement and insight appear normal. Mood & affect appropriate.   Discharge Instructions      Discharge Instructions    (HEART FAILURE PATIENTS) Call MD:  Anytime you have any of the following symptoms: 1) 3 pound weight gain in 24 hours or 5 pounds in 1 week 2) shortness of breath, with or without a dry hacking cough 3) swelling in the hands, feet or stomach 4) if you have to sleep on extra pillows at night in order to breathe.    Complete by:  As directed      Amb referral to AFIB Clinic    Complete by:  As directed      Call MD for:  difficulty breathing, headache or visual disturbances    Complete by:  As directed      Call MD for:  extreme fatigue    Complete by:  As directed      Call MD for:  persistant dizziness or light-headedness    Complete by:  As directed      Call MD for:  severe uncontrolled pain    Complete by:  As directed      Call MD for:    Complete by:  As directed   Palpitations/heart racing.     Diet - low sodium heart healthy    Complete by:  As directed      Diet Carb Modified    Complete by:  As directed      Increase activity slowly    Complete by:  As directed             Medication List    STOP taking these medications        hydrALAZINE 100 MG tablet  Commonly known as:  APRESOLINE     labetalol 300 MG tablet  Commonly known as:  NORMODYNE      TAKE these medications        acetaminophen 500 MG tablet  Commonly known as:  TYLENOL  Take 500 mg by mouth every 6 (six) hours as needed for mild pain.     amLODipine 10 MG tablet  Commonly known as:  NORVASC   Take 0.5 tablets (5 mg total) by mouth daily.     aspirin EC 81 MG tablet  Take 81 mg by mouth daily.     atorvastatin 40 MG tablet  Commonly known as:  LIPITOR  Take 1 tablet (40 mg total) by mouth daily at 6 PM.     calcium acetate 667 MG capsule  Commonly known as:  PHOSLO  Take 2 capsules (1,334 mg total) by mouth 3 (three) times daily with meals.     diltiazem 120 MG 24 hr capsule  Commonly known as:  CARDIZEM CD  Take 1 capsule (120 mg total) by mouth daily.     eucerin cream  Apply 1 application topically as needed for dry skin.     hydrOXYzine 25 MG tablet  Commonly known as:  ATARAX/VISTARIL  Take 1 tablet (25 mg total) by mouth every 6 (six) hours as needed.     ketoconazole 2 % cream  Commonly known as:  NIZORAL  Apply 1 application topically daily.     latanoprost 0.005 % ophthalmic solution  Commonly known as:  XALATAN  place 1 drop into right eye at bedtime     lidocaine-prilocaine cream  Commonly known as:  EMLA  APPLY A SMALL AMOUNT TO SKIN AT THE ACCESS SITE (AVF) AS DIRECTED BEFORE EACH DIALYSIS SESSION THREE DAYS A WEEK     metoprolol succinate 100 MG 24 hr tablet  Commonly known as:  TOPROL-XL  Take 1 tablet (100 mg total) by mouth daily. Take with or immediately following a meal.  Start taking on:  01/01/2016     PREVIDENT 5000 PLUS 1.1 % Crea dental cream  Generic drug:  sodium fluoride  Place 1 application onto teeth every morning.     SENSIPAR 30 MG tablet  Generic drug:  cinacalcet  Take 30 mg by mouth every evening.     silver sulfADIAZINE 1 % cream  Commonly known as:  SILVADENE  Apply 1 application topically daily.     VOLTAREN 1 % Gel  Generic drug:  diclofenac sodium  APPLY 2 GRAMS TO HANDS BID TO TID     warfarin 2.5 MG tablet  Commonly known as:  COUMADIN  Take 0.5 tablets (1.25 mg total) by mouth daily at 6 PM.       Follow-up Information    Follow up with Charlie Pitter, PA-C.   Specialties:  Cardiology, Radiology    Why:  [facility] - 01/20/16 at 10:30am. Lisbeth Renshaw is one of the PAs that works with Dr. Harrington Challenger.   Contact information:   [address] [place] [address] [phone]       Follow up with [name], MD. Schedule an appointment as soon as possible for a visit in 1 week.   Specialties:  Internal Medicine, Radiology   Contact information:   [address] [place] [address] [phone]       Follow up with Hemodialysis center.   Why:  Keep regular hemodialysis appointments on Tuesdays/Thursdays/Saturdays. Needs to have labs drawn  ( PT & INR) at next dialysis on 01/02/16. Results to be called to [facility] (pharmacist/Sally Earl)@[phone] for Coumadin dose adjus      Get Medicines reviewed and adjusted: Please take all your medications with you for your next visit with your Primary MD  Please request your Primary MD to go over all hospital tests and procedure/radiological results at the follow up. Please ask your Primary MD to get all Hospital records sent to his/her office.  If you experience worsening of your admission symptoms, develop shortness of breath, life threatening emergency, suicidal or homicidal thoughts you must seek medical attention immediately by calling 911 or calling your MD immediately if symptoms less severe.  You must read complete instructions/literature along with all the possible adverse reactions/side effects for all the Medicines you take and that have been prescribed to you. Take any new Medicines after you have completely understood and accept all the possible adverse reactions/side effects.   Do not drive when taking pain medications.   Do not take more than prescribed Pain, Sleep and Anxiety Medications  Special Instructions: If you have smoked or chewed Tobacco in the last 2 yrs please stop smoking, stop any regular Alcohol and or any Recreational drug use.  Wear Seat belts while  driving.  Please note  You were cared for by a hospitalist during your hospital stay. Once you are discharged, your primary care physician will handle any further medical issues. Please note that NO REFILLS for any discharge medications will be authorized once you are discharged, as it is imperative that you return to your primary care physician (or establish a relationship with a primary care physician if you do not have one) for your aftercare needs so that they can reassess your need for medications and monitor your lab values.    The results of significant diagnostics from this hospitalization (including imaging, microbiology, ancillary and laboratory) are listed below for reference.    Significant Diagnostic Studies: Dg Chest 2 View  12/26/2015  CLINICAL DATA:  Acute onset of generalized weakness and vomiting. Initial encounter. EXAM: CHEST  2 VIEW COMPARISON:  Chest radiograph performed 10/22/2013 FINDINGS: The lungs are well-aerated. Mild vascular congestion is noted. There is no evidence of focal opacification, pleural effusion or pneumothorax. The heart is normal in size; the mediastinal contour is within normal limits. No acute osseous abnormalities are seen. A vascular stent is noted at the left arm. IMPRESSION: Mild vascular congestion noted.  Lungs remain grossly clear. Electronically Signed   By: Garald Balding M.D.   On: 12/26/2015 22:29   Ct Head Wo Contrast  12/27/2015  CLINICAL DATA:  Left eye droop and hand weakness EXAM: CT HEAD WITHOUT CONTRAST TECHNIQUE: Contiguous axial images were obtained from the base of the skull through the vertex without intravenous contrast. COMPARISON:  None. FINDINGS: Brain: No evidence of acute infarction, hemorrhage, extra-axial collection, ventriculomegaly, or mass effect. Vascular: No hyperdense vessel or unexpected calcification. Skull: Negative for fracture or focal lesion. Sinuses/Orbits: No acute findings. Other: Mild white matter changes.  IMPRESSION: 1. No acute abnormalities identified to explain the patient's symptoms. No bleed or acute ischemia is seen. Findings called to Dr. Silverio Decamp at 2:18 p.m. Electronically Signed   By: Dorise Bullion III M.D   On: 12/27/2015 14:21   Mr Brain Wo Contrast  12/27/2015  CLINICAL DATA:  Stroke. Altered mental status following hemodialysis. Code stroke. EXAM: MRI HEAD WITHOUT CONTRAST TECHNIQUE: Multiplanar, multiecho pulse sequences of the brain and surrounding structures were obtained without intravenous contrast. COMPARISON:  CT head without contrast from the same day. MRI brain 10/19/2013. FINDINGS: The diffusion-weighted images demonstrate no evidence for acute or subacute infarction. Moderate atrophy and white matter disease is similar to the prior study. No acute hemorrhage or mass lesion is present. The ventricles are proportionate to the degree of atrophy. No significant extra-axial fluid collection is present. Remote lacunar infarcts are present in the pons. Cerebellar atrophy is again seen. Flow is present in the major intracranial arteries. A left lens replacement is present. The globes and orbits are otherwise intact. Mild mucosal thickening is present within the anterior ethmoid air cells and noted left greater than right frontal sinus. There is no fluid. The mastoid air cells are clear. Skullbase is within normal limits. Midline sagittal images demonstrate degenerative change in the upper cervical spine. Atrophy of the posterior corpus callosum is similar to the prior study. IMPRESSION: 1. No acute intracranial abnormality. 2. Stable moderate generalized atrophy and diffuse white matter disease. 3. Remote lacunar infarcts of the brainstem are stable. 4. Mild mucosal disease within the anterior ethmoid air cells and left frontal sinus. Electronically Signed   By: San Morelle M.D.   On: 12/27/2015 20:31   Nm Myocar Multi W/spect W/wall Motion / Ef  12/30/2015   Downsloping ST segment  depression ST segment depression was noted during stress in the II, V5 and V6 leads.  This is an intermediate risk study.  The left ventricular ejection fraction is moderately decreased (30-44%).  1. There was a fixed, medium-sized, mild mid to apical anteroseptal and inferoseptal perfusion defect. 2. EF 36% with septal wall motion abnormality. 3. Intermediate risk study.  Findings can all be explained by LBBB itself (perfusion defect due to artifact from LBBB), however cannot rule out prior infarction.  No significant ischemia.  Intermediate risk due to low EF.  Would get echo to confirm EF.    Microbiology: Recent Results (from the past 240 hour(s))  MRSA PCR Screening     Status: None   Collection Time: 12/27/15  1:45 AM  Result Value Ref Range Status   MRSA by PCR NEGATIVE NEGATIVE Final    Comment:        The GeneXpert MRSA Assay (FDA approved for NASAL specimens only), is one component of a comprehensive MRSA colonization surveillance program. It is not intended to diagnose MRSA infection nor to guide or monitor treatment for MRSA infections.      Labs: Basic Metabolic Panel:  Recent Labs Lab 12/26/15 2050 12/27/15 0141 12/28/15 0420 12/31/15 0719  NA 134*  --  130* 126*  K 4.1  --  4.2 5.0  CL 95*  --  92* 92*  CO2 25  --  24 23  GLUCOSE 147*  --  119* 102*  BUN 32*  --  59* 55*  CREATININE 6.67*  --  9.44* 8.92*  CALCIUM 9.1  --  8.7* 7.9*  MG  --  2.2  --   --   PHOS  --   --   --  4.1   Liver Function Tests:  Recent Labs Lab 12/28/15 0420 12/31/15 0719  AST 9*  --   ALT 8*  --   ALKPHOS 36*  --   BILITOT 0.7  --   PROT 6.5  --   ALBUMIN 2.9* 2.9*   No results for input(s): LIPASE, AMYLASE in the last 168 hours. No results for input(s): AMMONIA in the last 168 hours. CBC:  Recent Labs Lab 12/26/15 2050 12/27/15 0141 12/28/15 0844 12/30/15 0436 12/31/15 0719  WBC 6.4 7.8 5.8 5.8 5.2  HGB 12.8* 13.4 10.9* 10.3* 10.3*  HCT 39.6 41.9 33.5*  31.8* 31.8*  MCV 87.8 90.5 88.2 86.4 85.3  PLT 239 236 205 189 214   Cardiac Enzymes:  Recent Labs Lab 12/26/15 2136 12/27/15 0141 12/27/15 0905 12/27/15 1451  TROPONINI 0.05* 0.05* 0.05* 0.05*   BNP: BNP (last 3 results) No results for input(s): BNP in the last 8760 hours.  ProBNP (last 3 results) No results for input(s): PROBNP in the last 8760 hours.  CBG:  Recent Labs Lab 12/30/15 0755 12/30/15 1123 12/30/15 1622 12/30/15 2204 12/31/15 1110  GLUCAP 81 88 138* 105* 86       Signed:  Mizuki Hoel, MD, FACP, FHM. [facility] Pager [phone]  If 7PM-7AM, please contact night-coverage [email or web address] Password [email or web address] 12/31/2015, 11:48 AM

## 2015-12-31 NOTE — Care Management Note (Signed)
Case Management Note  Patient Details  Name: Oscar Castillo MRN: [id number] Date of Birth: [birth year]  Subjective/Objective:  Pt admitted for New Onset Afib. Post stress test on 12-30-15. Plan for d/c today.                   Action/Plan: No disposition needs identified by CM at this time.    Expected Discharge Date:                  Expected Discharge Plan:  Home/Self Care  In-House Referral:  NA  Discharge planning Services  CM Consult  Post Acute Care Choice:  NA Choice offered to:  NA  DME Arranged:  N/A DME Agency:  NA  HH Arranged:  NA HH Agency:  NA  Status of Service:  Completed, signed off  Medicare Important Message Given:  Yes Date Medicare IM Given:    Medicare IM give by:    Date Additional Medicare IM Given:    Additional Medicare Important Message give by:     If discussed at [place] of Stay Meetings, dates discussed:    Additional Comments:  Bethena Roys, RN 12/31/2015, 12:15 PM

## 2015-12-31 NOTE — Telephone Encounter (Signed)
FYI: patient is being discharged, recently started on Coumadin for atrial fib. Per discussion with IM, he will be having his INRs drawn at HD and they will be sending results to [facility] to follow. Next draw will be on Thursday 6/8. If there is anything else that needs to be done to set this up, please let me know.  Dayna Dunn PA-C

## 2015-12-31 NOTE — Telephone Encounter (Addendum)
Received following message in staff messages by Ovidio Kin - "This pt will need to come into Coumadin Clinic to establish as new patient. New pt's cannot have INR's done at HD, there is a 48 hrs delay in reporting INR's once drawn, only stable established pt's in our clinic are drawn at HD. Please see that this pt gets scheduled in the Coumadin Clinic for a new patient appt. Thanks. "  Patient states his HD is early in the AM thus Coumadin clinic visit scheduled for Thurs 6/8 at 2:30pm. I notified nurse to add to AVS since he already printed it out. Ivanka Kirshner PA-C

## 2015-12-31 NOTE — Consult Note (Signed)
   [facility] CM Inpatient Consult   12/31/2015  Oscar Castillo 1951/05/21 [id number]    Patient screened for [place] Management program. Martin Majestic to patient's room to discuss program. However, he was already discharged.  Marland Kitchenah

## 2015-12-31 NOTE — Procedures (Signed)
I was present at this dialysis session. I have reviewed the session itself and made appropriate changes.   Nuc ST with LVEF 36%; fixed med defect.    2K bath. UF goal 4L, Bed weight higher than that, but unreliable.  Hb ok.  INR 2.69.  Recent Labs Lab 12/31/15 0719  NA 126*  K 5.0  CL 92*  CO2 23  GLUCOSE 102*  BUN 55*  CREATININE 8.92*  CALCIUM 7.9*  PHOS 4.1     Recent Labs Lab 12/28/15 0844 12/30/15 0436 12/31/15 0719  WBC 5.8 5.8 5.2  HGB 10.9* 10.3* 10.3*  HCT 33.5* 31.8* 31.8*  MCV 88.2 86.4 85.3  PLT 205 189 214    Scheduled Meds: . aspirin EC  81 mg Oral Daily  . atorvastatin  10 mg Oral q1800  . calcium acetate  2,001 mg Oral TID WC  . cinacalcet  30 mg Oral QPM  . diltiazem  120 mg Oral Daily  . insulin aspart  0-15 Units Subcutaneous TID WC  . insulin aspart  0-5 Units Subcutaneous QHS  . latanoprost  1 drop Both Eyes QHS  . metoprolol tartrate  50 mg Oral BID  . off the beat book   Does not apply Once  . pantoprazole  40 mg Oral Daily  . regadenoson  0.4 mg Intravenous Once  . Warfarin - Pharmacist Dosing Inpatient   Does not apply q1800   Continuous Infusions:  PRN Meds:.acetaminophen, ALPRAZolam, hydrocerin, hydrOXYzine, ondansetron **OR** ondansetron (ZOFRAN) IV   Pearson Grippe  MD 12/31/2015, 8:25 AM

## 2015-12-31 NOTE — Progress Notes (Signed)
PATIENT ID:  [id number] with hypertension, hyperlipidemia, chronic systolic and diastolic heart failure (LVEF [id number], grade 2 diastolic dysfunction), new LBBB, ESRD on dialysis here with a new diagnosis of paroxysmal atrial fibrillation.    SUBJECTIVE:  Oscar Castillo is feeling well.  He denies chest pain or shortness of breath.  PHYSICAL EXAM Filed Vitals:   12/31/15 0800 12/31/15 0832 12/31/15 0900 12/31/15 0930  BP: 174/89 172/94 156/93 156/90  Pulse: 75 83 81 82  Temp:      TempSrc:      Resp: 16 19 17 16   Height:      Weight:      SpO2:       General:  Well-appearing.  No acute distress Neck: No JVD Lungs:  CTAB.  No crackles, rhonchi or wheezes Heart:  RRR. No m/r/g.  Normal S1/S2 Abdomen:  Soft.  NT, ND.  +BS Extremities:  Bilateral BKA.  No edema.   LABS: Lab Results  Component Value Date   TROPONINI 0.05* 12/27/2015   Results for orders placed or performed during the hospital encounter of 12/26/15 (from the past 24 hour(s))  Glucose, capillary     Status: None   Collection Time: 12/30/15 11:23 AM  Result Value Ref Range   Glucose-Capillary 88 65 - 99 mg/dL  Glucose, capillary     Status: Abnormal   Collection Time: 12/30/15  4:22 PM  Result Value Ref Range   Glucose-Capillary 138 (H) 65 - 99 mg/dL  Glucose, capillary     Status: Abnormal   Collection Time: 12/30/15 10:04 PM  Result Value Ref Range   Glucose-Capillary 105 (H) 65 - 99 mg/dL  Protime-INR     Status: Abnormal   Collection Time: 12/31/15  4:58 AM  Result Value Ref Range   Prothrombin Time 28.2 (H) 11.6 - 15.2 seconds   INR 2.69 (H) 0.00 - 1.49  CBC     Status: Abnormal   Collection Time: 12/31/15  7:19 AM  Result Value Ref Range   WBC 5.2 4.0 - 10.5 K/uL   RBC 3.73 (L) 4.22 - 5.81 MIL/uL   Hemoglobin 10.3 (L) 13.0 - 17.0 g/dL   HCT 31.8 (L) 39.0 - 52.0 %   MCV 85.3 78.0 - 100.0 fL   MCH 27.6 26.0 - 34.0 pg   MCHC 32.4 30.0 - 36.0 g/dL   RDW 14.9 11.5 - 15.5 %   Platelets 214 150 - 400 K/uL  Renal  function panel     Status: Abnormal   Collection Time: 12/31/15  7:19 AM  Result Value Ref Range   Sodium 126 (L) 135 - 145 mmol/L   Potassium 5.0 3.5 - 5.1 mmol/L   Chloride 92 (L) 101 - 111 mmol/L   CO2 23 22 - 32 mmol/L   Glucose, Bld 102 (H) 65 - 99 mg/dL   BUN 55 (H) 6 - 20 mg/dL   Creatinine, Ser 8.92 (H) 0.61 - 1.24 mg/dL   Calcium 7.9 (L) 8.9 - 10.3 mg/dL   Phosphorus 4.1 2.5 - 4.6 mg/dL   Albumin 2.9 (L) 3.5 - 5.0 g/dL   GFR calc non Af Amer 6 (L) >60 mL/min   GFR calc Af Amer 6 (L) >60 mL/min   Anion gap 11 5 - 15   No intake or output data in the 24 hours ending 12/31/15 0958  Telemetry:  No events  ASSESSMENT AND PLAN:  Principal Problem:   New onset a-fib [facility]) Active Problems:   Essential hypertension  GERD   Hyperlipidemia   Chronic combined systolic and diastolic congestive heart failure   ESRD on hemodialysis (HCC)   Glaucoma   LBBB (left bundle branch block)   Confusion   Stroke-like symptoms   Elevated troponin   Anemia   # Paroxysmal atrial fibrillation, new onset: Oscar Castillo remains in sinus rhythm.  Continue diltiazem for rate control. Continue anticoagulation with warfarin. This patients CHA2DS2-VASc Score and unadjusted Ischemic Stroke Rate (% per year) is equal to 3.2 % stroke rate/year from a score of 3 Above score calculated as 1 point each if present [CHF, HTN, DM, Vascular=MI/PAD/Aortic Plaque, Age if 65-74, or Male] Above score calculated as 2 points each if present [Age > 75, or Stroke/TIA/TE]   # LBBB:  # Elevated troponin: This was diagnosed this admission. Echo was without wall motion abnormalities. Lexiscan Myoview pending today.     # Hypertension: Blood pressure remains poorly-controlled.  His home antihypertensives were held due to atrial fibrillation and he was started on diltiazem and metoprolol this admission.  Metoprolol was consolidated to 50mg  bid.  Will switch to metorprolol succinate 100 mg daily.  Continue diltiazem 120 mg  daily.  We will also start amlodipine 5 mg daily.   # Hyperlipidemia: LDL was 111 12/11/15.   Atorvastatin was increased today. We will arrange follow-up in 6 weeks for a repeat of his LFTs and lipid panel.    Charvis Lightner C. Oval Linsey, MD, [facility] 12/31/2015 9:58 AM

## 2016-01-01 ENCOUNTER — Telehealth: Payer: Self-pay | Admitting: Internal Medicine

## 2016-01-01 NOTE — Telephone Encounter (Signed)
New message     Pt c/o medication issue:  1. Name of Medication: plavix  2. How are you currently taking this medication (dosage and times per day)? Information not given by the pharmacist  3. Are you having a reaction (difficulty breathing--STAT)? no  4. What is your medication issue? The pt was discharged from the hospital yesterday and Melina Copa wrote a prescription for Warfin not knowing the pt was on Plavix so the question is does the pt need to stop the Plavix so the pt can start taking the Warfin.

## 2016-01-01 NOTE — Telephone Encounter (Signed)
Yes, he is to be on coumadin and not plavix  He had newly diagnosed afib   Needs coumadin

## 2016-01-01 NOTE — Telephone Encounter (Signed)
Called and spoke with pharmacist at [facility]. Family was there to pick up warfarin prescription and still had plavix on his profile as taking at [facility].  Advised that Plavix was listed on med list at  with Dr. [name] in April but not in May.  I am unable to determine when it was discontinued.   Advised that he was discharged from hospital on warfarin and is scheduled with our [facility]. Advised pharmacist that patient is not to take Plavix, only warfarin.  He will call patient's family and instruct.  Routing to Dr. Harrington Challenger to confirm medication instructions: that patient is to be on warfarin; not on Plavix.

## 2016-01-02 ENCOUNTER — Ambulatory Visit (INDEPENDENT_AMBULATORY_CARE_PROVIDER_SITE_OTHER): Payer: Medicare Other | Admitting: Pharmacist

## 2016-01-02 DIAGNOSIS — Z5181 Encounter for therapeutic drug level monitoring: Secondary | ICD-10-CM | POA: Diagnosis not present

## 2016-01-02 DIAGNOSIS — D631 Anemia in chronic kidney disease: Secondary | ICD-10-CM | POA: Diagnosis not present

## 2016-01-02 DIAGNOSIS — N186 End stage renal disease: Secondary | ICD-10-CM | POA: Diagnosis not present

## 2016-01-02 DIAGNOSIS — I4891 Unspecified atrial fibrillation: Secondary | ICD-10-CM

## 2016-01-02 DIAGNOSIS — D509 Iron deficiency anemia, unspecified: Secondary | ICD-10-CM | POA: Diagnosis not present

## 2016-01-02 DIAGNOSIS — E1122 Type 2 diabetes mellitus with diabetic chronic kidney disease: Secondary | ICD-10-CM | POA: Diagnosis not present

## 2016-01-02 DIAGNOSIS — N2581 Secondary hyperparathyroidism of renal origin: Secondary | ICD-10-CM | POA: Diagnosis not present

## 2016-01-02 LAB — POCT INR: INR: 2.1

## 2016-01-04 DIAGNOSIS — D509 Iron deficiency anemia, unspecified: Secondary | ICD-10-CM | POA: Diagnosis not present

## 2016-01-04 DIAGNOSIS — E1122 Type 2 diabetes mellitus with diabetic chronic kidney disease: Secondary | ICD-10-CM | POA: Diagnosis not present

## 2016-01-04 DIAGNOSIS — N2581 Secondary hyperparathyroidism of renal origin: Secondary | ICD-10-CM | POA: Diagnosis not present

## 2016-01-04 DIAGNOSIS — D631 Anemia in chronic kidney disease: Secondary | ICD-10-CM | POA: Diagnosis not present

## 2016-01-04 DIAGNOSIS — N186 End stage renal disease: Secondary | ICD-10-CM | POA: Diagnosis not present

## 2016-01-07 DIAGNOSIS — N186 End stage renal disease: Secondary | ICD-10-CM | POA: Diagnosis not present

## 2016-01-07 DIAGNOSIS — D631 Anemia in chronic kidney disease: Secondary | ICD-10-CM | POA: Diagnosis not present

## 2016-01-07 DIAGNOSIS — N2581 Secondary hyperparathyroidism of renal origin: Secondary | ICD-10-CM | POA: Diagnosis not present

## 2016-01-07 DIAGNOSIS — D509 Iron deficiency anemia, unspecified: Secondary | ICD-10-CM | POA: Diagnosis not present

## 2016-01-07 DIAGNOSIS — E1122 Type 2 diabetes mellitus with diabetic chronic kidney disease: Secondary | ICD-10-CM | POA: Diagnosis not present

## 2016-01-09 ENCOUNTER — Encounter: Payer: Self-pay | Admitting: Internal Medicine

## 2016-01-09 ENCOUNTER — Ambulatory Visit (INDEPENDENT_AMBULATORY_CARE_PROVIDER_SITE_OTHER): Payer: Medicare Other

## 2016-01-09 ENCOUNTER — Ambulatory Visit (INDEPENDENT_AMBULATORY_CARE_PROVIDER_SITE_OTHER): Payer: Medicare Other | Admitting: Internal Medicine

## 2016-01-09 VITALS — BP 130/70 | HR 74 | Temp 98.6°F | Resp 20 | Wt 197.0 lb

## 2016-01-09 DIAGNOSIS — I5042 Chronic combined systolic (congestive) and diastolic (congestive) heart failure: Secondary | ICD-10-CM | POA: Diagnosis not present

## 2016-01-09 DIAGNOSIS — I639 Cerebral infarction, unspecified: Secondary | ICD-10-CM

## 2016-01-09 DIAGNOSIS — I638 Other cerebral infarction: Secondary | ICD-10-CM | POA: Diagnosis not present

## 2016-01-09 DIAGNOSIS — I6389 Other cerebral infarction: Secondary | ICD-10-CM

## 2016-01-09 DIAGNOSIS — I10 Essential (primary) hypertension: Secondary | ICD-10-CM

## 2016-01-09 DIAGNOSIS — D509 Iron deficiency anemia, unspecified: Secondary | ICD-10-CM | POA: Diagnosis not present

## 2016-01-09 DIAGNOSIS — N2581 Secondary hyperparathyroidism of renal origin: Secondary | ICD-10-CM | POA: Diagnosis not present

## 2016-01-09 DIAGNOSIS — D631 Anemia in chronic kidney disease: Secondary | ICD-10-CM | POA: Diagnosis not present

## 2016-01-09 DIAGNOSIS — N184 Chronic kidney disease, stage 4 (severe): Secondary | ICD-10-CM

## 2016-01-09 DIAGNOSIS — N186 End stage renal disease: Secondary | ICD-10-CM | POA: Diagnosis not present

## 2016-01-09 DIAGNOSIS — E1022 Type 1 diabetes mellitus with diabetic chronic kidney disease: Secondary | ICD-10-CM

## 2016-01-09 DIAGNOSIS — I4891 Unspecified atrial fibrillation: Secondary | ICD-10-CM

## 2016-01-09 DIAGNOSIS — E1122 Type 2 diabetes mellitus with diabetic chronic kidney disease: Secondary | ICD-10-CM | POA: Diagnosis not present

## 2016-01-09 LAB — POCT INR: INR: 4.3

## 2016-01-09 MED ORDER — DILTIAZEM HCL ER COATED BEADS 120 MG PO CP24: 120.0000 mg | ORAL_CAPSULE | Freq: Every day | ORAL | Status: DC

## 2016-01-09 MED ORDER — METOPROLOL SUCCINATE ER 100 MG PO TB24: 100.0000 mg | ORAL_TABLET | Freq: Every day | ORAL | Status: DC

## 2016-01-09 MED ORDER — WARFARIN SODIUM 2.5 MG PO TABS: 1.2500 mg | ORAL_TABLET | Freq: Every day | ORAL | Status: DC

## 2016-01-09 NOTE — Progress Notes (Signed)
Subjective:    Patient ID: Oscar Castillo, male    DOB: 1951/06/10, 65 y.o.   MRN: [id number]  HPI  Here to f/u post hospn with afib new onset, with Dilt ER/toprolXL/coumadin added. Still taking amlodipine.  Pt denies chest pain, increased sob or doe, wheezing, orthopnea, PND, increased LE swelling, palpitations, dizziness or syncope.  Pt denies new neurological symptoms such as new headache, or facial or extremity weakness or numbness   Pt denies polydipsia, polyuria, or low sugar symptoms such as weakness or confusion improved with po intake.  Pt states overall good compliance with meds, trying to follow approp diet.  Cont's with HD t-th-sat.  No new complaints Past Medical History  Diagnosis Date  . ESRD on hemodialysis ([place]) 05/05/2007    ESRD due to DM/HTN. Started dialysis in November 2013.  HD TTS at [facility] on [address].  [name] BACK PAIN, LUMBAR, CHRONIC 08/06/2009  . BENIGN PROSTATIC HYPERTROPHY 08/01/2010  . CEREBROVASCULAR ACCIDENT, HX OF 08/06/2009  . CHOLELITHIASIS 08/01/2010  . CONGESTIVE HEART FAILURE 03/18/2009  . DEPRESSION 03/18/2009  . DIABETES MELLITUS, TYPE II 03/25/2007  . ERECTILE DYSFUNCTION 03/25/2007  . GERD 03/25/2007  . HEPATITIS C, HX OF 03/25/2007  . HYPERTENSION 03/25/2007  . Morbid obesity ([place]) 03/25/2007  . NEPHROLITHIASIS, HX OF 03/18/2009  . Complication of anesthesia     wife states pt had trouble waking up with his last surgery in Nov., 2014  . History of Clostridium difficile   . Hemorrhoids   . Anemia   . Antral ulcer 2014    small   Past Surgical History  Procedure Laterality Date  . Nephrectomy      partial RR  . Av fistula placement  06/14/2012    Procedure: ARTERIOVENOUS (AV) FISTULA CREATION;  Surgeon: Angelia Mould, MD;  Location: [facility] OR;  Service: Vascular;  Laterality: Left;  Left basilic vein transposition with fistula.  . Tibia im nail insertion Left 09/09/2012    Procedure: INTRAMEDULLARY (IM) NAIL TIBIAL;  Surgeon: Johnny Bridge, MD;   Location: [address];  Service: Orthopedics;  Laterality: Left;  left tibial nail and open reduction internal fixation left fibula fracture  . Orif fibula fracture Left 09/09/2012    Procedure: OPEN REDUCTION INTERNAL FIXATION (ORIF) FIBULA FRACTURE;  Surgeon: Johnny Bridge, MD;  Location: [place];  Service: Orthopedics;  Laterality: Left;  . Colonoscopy N/A 10/28/2012    Procedure: COLONOSCOPY;  Surgeon: Jeryl Columbia, MD;  Location: [facility] ENDOSCOPY;  Service: Endoscopy;  Laterality: N/A;  . Esophagogastroduodenoscopy N/A 11/02/2012    Procedure: ESOPHAGOGASTRODUODENOSCOPY (EGD);  Surgeon: Cleotis Nipper, MD;  Location: [facility] ENDOSCOPY;  Service: Endoscopy;  Laterality: N/A;  . Colonoscopy N/A 11/02/2012    Procedure: COLONOSCOPY;  Surgeon: Cleotis Nipper, MD;  Location: [facility] ENDOSCOPY;  Service: Endoscopy;  Laterality: N/A;  . Colonoscopy N/A 11/03/2012    Procedure: COLONOSCOPY;  Surgeon: Cleotis Nipper, MD;  Location: [facility] ENDOSCOPY;  Service: Endoscopy;  Laterality: N/A;  . Givens capsule study N/A 11/04/2012    Procedure: GIVENS CAPSULE STUDY;  Surgeon: Cleotis Nipper, MD;  Location: [facility] ENDOSCOPY;  Service: Endoscopy;  Laterality: N/A;  . Enteroscopy N/A 11/08/2012    Procedure: ENTEROSCOPY;  Surgeon: Wonda Horner, MD;  Location: [facility] ENDOSCOPY;  Service: Endoscopy;  Laterality: N/A;  . Amputation Left 05/12/2013    Procedure: AMPUTATION RAY;  Surgeon: Newt Minion, MD;  Location: [place];  Service: Orthopedics;  Laterality: Left;  Left Foot  1st Ray Amputation  . Eye surgery Left     to remove scar tissue  . Amputation Left 06/09/2013    Procedure: AMPUTATION BELOW KNEE;  Surgeon: Newt Minion, MD;  Location: [place];  Service: Orthopedics;  Laterality: Left;  Left Below Knee Amputation and removal proximal screws IM tibial nail  . Hardware removal Left 06/09/2013    Procedure: HARDWARE REMOVAL;  Surgeon: Newt Minion, MD;  Location: [place];  Service: Orthopedics;  Laterality: Left;  Left Below Knee  Amputation  and Removal proximal screws IM tibial nail  . Amputation Right 09/08/2013    Procedure: AMPUTATION BELOW KNEE;  Surgeon: Newt Minion, MD;  Location: [place];  Service: Orthopedics;  Laterality: Right;  Right Below Knee Amputation  . Amputation Right 10/11/2013    Procedure: AMPUTATION BELOW KNEE;  Surgeon: Newt Minion, MD;  Location: [place];  Service: Orthopedics;  Laterality: Right;  Right Below Knee Amputation Revision    reports that he has never smoked. He has never used smokeless tobacco. He reports that he does not drink alcohol or use illicit drugs. family history includes Coronary artery disease in his other; Diabetes in his mother. No Known Allergies Current Outpatient Prescriptions on File Prior to Visit  Medication Sig Dispense Refill  . acetaminophen (TYLENOL) 500 MG tablet Take 500 mg by mouth every 6 (six) hours as needed for mild pain.    [name] aspirin EC 81 MG tablet Take 81 mg by mouth daily.    [name] atorvastatin (LIPITOR) 40 MG tablet Take 1 tablet (40 mg total) by mouth daily at 6 PM. 30 tablet 0  . calcium acetate (PHOSLO) 667 MG capsule Take 2 capsules (1,334 mg total) by mouth 3 (three) times daily with meals. (Patient taking differently: Take 2,001 mg by mouth 3 (three) times daily with meals. ) 180 capsule 0  . hydrOXYzine (ATARAX/VISTARIL) 25 MG tablet Take 1 tablet (25 mg total) by mouth every 6 (six) hours as needed. (Patient taking differently: Take 25 mg by mouth every 6 (six) hours as needed for itching. ) 40 tablet 2  . ketoconazole (NIZORAL) 2 % cream Apply 1 application topically daily. 30 g 1  . latanoprost (XALATAN) 0.005 % ophthalmic solution place 1 drop into right eye at bedtime  0  . lidocaine-prilocaine (EMLA) cream APPLY A SMALL AMOUNT TO SKIN AT THE ACCESS SITE (AVF) AS DIRECTED BEFORE EACH DIALYSIS SESSION THREE DAYS A WEEK  6  . PREVIDENT 5000 PLUS 1.1 % CREA dental cream Place 1 application onto teeth every morning.   6  . SENSIPAR 30 MG tablet  Take 30 mg by mouth every evening.     . silver sulfADIAZINE (SILVADENE) 1 % cream Apply 1 application topically daily.    . Skin Protectants, Misc. (EUCERIN) cream Apply 1 application topically as needed for dry skin.    [name] VOLTAREN 1 % GEL APPLY 2 GRAMS TO HANDS BID TO TID  0   No current facility-administered medications on file prior to visit.   Review of Systems  Constitutional: Negative for unusual diaphoresis or night sweats HENT: Negative for ear swelling or discharge Eyes: Negative for worsening visual haziness  Respiratory: Negative for choking and stridor.   Gastrointestinal: Negative for distension or worsening eructation Genitourinary: Negative for retention or change in urine volume.  Musculoskeletal: Negative for other MSK pain or swelling Skin: Negative for color change and worsening wound Neurological: Negative for tremors and numbness other than noted  Psychiatric/Behavioral:  Negative for decreased concentration or agitation other than above       Objective:   Physical Exam BP 130/70 mmHg  Pulse 74  Temp(Src) 98.6 F (37 C) (Oral)  Resp 20  Wt 197 lb (89.359 kg)  SpO2 97% VS noted,  Constitutional: Pt appears in no apparent distress HENT: Head: NCAT.  Right Ear: External ear normal.  Left Ear: External ear normal.  Eyes: . Pupils are equal, round, and reactive to light. Conjunctivae and EOM are normal Neck: Normal range of motion. Neck supple.  Cardiovascular: Normal rate and irregular rhythm.   Pulmonary/Chest: Effort normal and breath sounds without rales or wheezing.  Neurological: Pt is alert. Not confused , motor grossly intact Skin: Skin is warm. No rash, no edema Psychiatric: Pt behavior is normal. No agitation.     Assessment & Plan:

## 2016-01-09 NOTE — Patient Instructions (Addendum)
OK to stop the amlodipine since you are taking the diltiazem  Please continue all other medications as before, and refills have been done if requested.  Please have the pharmacy call with any other refills you may need.  Please keep your appointments with your specialists as you may have planned

## 2016-01-09 NOTE — Progress Notes (Signed)
Pre visit review using our clinic review tool, if applicable. No additional management support is needed unless otherwise documented below in the visit note. 

## 2016-01-10 NOTE — Assessment & Plan Note (Signed)
Rate controlled, volume stable, cont same tx,  to f/u any worsening symptoms or concerns

## 2016-01-10 NOTE — Assessment & Plan Note (Signed)
stable overall by history and exam, and pt to continue medical treatment as before,  to f/u any worsening symptoms or concerns 

## 2016-01-10 NOTE — Assessment & Plan Note (Signed)
stable overall by history and exam, recent data reviewed with pt, and pt to continue medical treatment as before,  to f/u any worsening symptoms or concerns Lab Results  Component Value Date   WBC 5.2 12/31/2015   HGB 10.3* 12/31/2015   HCT 31.8* 12/31/2015   PLT 214 12/31/2015   GLUCOSE 102* 12/31/2015   CHOL 158 12/11/2015   TRIG 90.0 12/11/2015   HDL 29.70* 12/11/2015   LDLCALC 111* 12/11/2015   ALT 8* 12/28/2015   AST 9* 12/28/2015   NA 126* 12/31/2015   K 5.0 12/31/2015   CL 92* 12/31/2015   CREATININE 8.92* 12/31/2015   BUN 55* 12/31/2015   CO2 23 12/31/2015   TSH 0.48 06/13/2015   PSA 0.60 05/06/2011   INR 4.3 01/09/2016   HGBA1C 6.2 12/11/2015   MICROALBUR 220.0* 08/06/2009

## 2016-01-10 NOTE — Assessment & Plan Note (Signed)
stable overall by history and exam, recent data reviewed with pt, and pt to continue medical treatment as before,  to f/u any worsening symptoms or concerns BP Readings from Last 3 Encounters:  01/09/16 130/70  12/31/15 164/83  12/11/15 130/70

## 2016-01-10 NOTE — Assessment & Plan Note (Signed)
stable overall by history and exam, recent data reviewed with pt, and pt to continue medical treatment as before,  to f/u any worsening symptoms or concerns Lab Results  Component Value Date   HGBA1C 6.2 12/11/2015

## 2016-01-11 DIAGNOSIS — N186 End stage renal disease: Secondary | ICD-10-CM | POA: Diagnosis not present

## 2016-01-11 DIAGNOSIS — N2581 Secondary hyperparathyroidism of renal origin: Secondary | ICD-10-CM | POA: Diagnosis not present

## 2016-01-11 DIAGNOSIS — D509 Iron deficiency anemia, unspecified: Secondary | ICD-10-CM | POA: Diagnosis not present

## 2016-01-11 DIAGNOSIS — D631 Anemia in chronic kidney disease: Secondary | ICD-10-CM | POA: Diagnosis not present

## 2016-01-11 DIAGNOSIS — E1122 Type 2 diabetes mellitus with diabetic chronic kidney disease: Secondary | ICD-10-CM | POA: Diagnosis not present

## 2016-01-14 DIAGNOSIS — D631 Anemia in chronic kidney disease: Secondary | ICD-10-CM | POA: Diagnosis not present

## 2016-01-14 DIAGNOSIS — E1122 Type 2 diabetes mellitus with diabetic chronic kidney disease: Secondary | ICD-10-CM | POA: Diagnosis not present

## 2016-01-14 DIAGNOSIS — D509 Iron deficiency anemia, unspecified: Secondary | ICD-10-CM | POA: Diagnosis not present

## 2016-01-14 DIAGNOSIS — N186 End stage renal disease: Secondary | ICD-10-CM | POA: Diagnosis not present

## 2016-01-14 DIAGNOSIS — N2581 Secondary hyperparathyroidism of renal origin: Secondary | ICD-10-CM | POA: Diagnosis not present

## 2016-01-16 ENCOUNTER — Ambulatory Visit (INDEPENDENT_AMBULATORY_CARE_PROVIDER_SITE_OTHER): Payer: Medicare Other | Admitting: *Deleted

## 2016-01-16 DIAGNOSIS — I6389 Other cerebral infarction: Secondary | ICD-10-CM

## 2016-01-16 DIAGNOSIS — N186 End stage renal disease: Secondary | ICD-10-CM | POA: Diagnosis not present

## 2016-01-16 DIAGNOSIS — N2581 Secondary hyperparathyroidism of renal origin: Secondary | ICD-10-CM | POA: Diagnosis not present

## 2016-01-16 DIAGNOSIS — I4891 Unspecified atrial fibrillation: Secondary | ICD-10-CM

## 2016-01-16 DIAGNOSIS — E1122 Type 2 diabetes mellitus with diabetic chronic kidney disease: Secondary | ICD-10-CM | POA: Diagnosis not present

## 2016-01-16 DIAGNOSIS — I638 Other cerebral infarction: Secondary | ICD-10-CM

## 2016-01-16 DIAGNOSIS — D631 Anemia in chronic kidney disease: Secondary | ICD-10-CM | POA: Diagnosis not present

## 2016-01-16 DIAGNOSIS — D509 Iron deficiency anemia, unspecified: Secondary | ICD-10-CM | POA: Diagnosis not present

## 2016-01-16 LAB — POCT INR: INR: 3.3

## 2016-01-17 ENCOUNTER — Encounter: Payer: Self-pay | Admitting: Physician Assistant

## 2016-01-17 NOTE — Progress Notes (Signed)
Cardiology Office Note    Date:  01/20/2016  ID:  Oscar Castillo, DOB 1950/09/07, MRN [id number] PCP:  [name], MD  Cardiologist:  Dr. Harrington Challenger   Chief Complaint: f/u afib  History of Present Illness:  Oscar Castillo is a 65 y.o. male with history of ESRD on HD, chronic combined CHF, CVA, DM, HTN, hepatitis C, and recently diagnosed atrial fib RVR/LBBB who presents for post-hospital follow-up. He was recently admitted with fatigue 12/31/15 and was found to be in AF RVR, new LBBB, mild vascular congestion on CXR, and mildly elevated troponin. He was placed on Cardizem and converted to NSR. He underwent nuclear stress 12/30/15 with fixed defects but no significant ischemia, findings were felt likely explained by LBBB. EF was 36% by nuc but f/u echo 12/27/15 with mild LVH, EF 50-55%, no rWMA, severe AV thickening without AS/AI, mild MR. He was started on Coumadin. Atorvastatin was increased. He also had transient AMS/strokelike sx on 6/2 and MR/CT were nonacute, no formal dx was given by neurology.  F/u appointment was arranged in our Coumadin clinic 01/02/16 but it appears this was moved to 01/16/16.  He presents for follow-up today without any acute complaints. Denies CP, SOB, palpitations. He was discharged from SNF and is now at home. No syncope. No issues with bleeding noted.   Past Medical History  Diagnosis Date  . ESRD on hemodialysis ([place])     ESRD due to DM/HTN. Started dialysis in November 2013.  HD TTS at [facility] on [address].  [name] BACK PAIN, LUMBAR, CHRONIC   . BENIGN PROSTATIC HYPERTROPHY   . CEREBROVASCULAR ACCIDENT, HX OF   . CHOLELITHIASIS   . DEPRESSION   . DIABETES MELLITUS, TYPE II   . ERECTILE DYSFUNCTION   . GERD   . HEPATITIS C, HX OF   . HYPERTENSION   . Morbid obesity ([place])   . NEPHROLITHIASIS, HX OF   . Complication of anesthesia     wife states pt had trouble waking up with his last surgery in Nov., 2014  . History of Clostridium difficile   . Hemorrhoids   .  Anemia   . Antral ulcer 2014    small  . Chronic combined systolic and diastolic CHF (congestive heart failure) ([place])   . PAF (paroxysmal atrial fibrillation) ([place])     a. Dx 12/2015.  [name] LBBB (left bundle branch block)   . S/P bilateral BKA (below knee amputation) [facility])     Past Surgical History  Procedure Laterality Date  . Nephrectomy      partial RR  . Av fistula placement  06/14/2012    Procedure: ARTERIOVENOUS (AV) FISTULA CREATION;  Surgeon: Angelia Mould, MD;  Location: [facility] OR;  Service: Vascular;  Laterality: Left;  Left basilic vein transposition with fistula.  . Tibia im nail insertion Left 09/09/2012    Procedure: INTRAMEDULLARY (IM) NAIL TIBIAL;  Surgeon: Johnny Bridge, MD;  Location: [place];  Service: Orthopedics;  Laterality: Left;  left tibial nail and open reduction internal fixation left fibula fracture  . Orif fibula fracture Left 09/09/2012    Procedure: OPEN REDUCTION INTERNAL FIXATION (ORIF) FIBULA FRACTURE;  Surgeon: Johnny Bridge, MD;  Location: [place];  Service: Orthopedics;  Laterality: Left;  . Colonoscopy N/A 10/28/2012    Procedure: COLONOSCOPY;  Surgeon: Jeryl Columbia, MD;  Location: [facility] ENDOSCOPY;  Service: Endoscopy;  Laterality: N/A;  . Esophagogastroduodenoscopy N/A 11/02/2012    Procedure: ESOPHAGOGASTRODUODENOSCOPY (EGD);  Surgeon:  Cleotis Nipper, MD;  Location: [facility] ENDOSCOPY;  Service: Endoscopy;  Laterality: N/A;  . Colonoscopy N/A 11/02/2012    Procedure: COLONOSCOPY;  Surgeon: Cleotis Nipper, MD;  Location: [facility] ENDOSCOPY;  Service: Endoscopy;  Laterality: N/A;  . Colonoscopy N/A 11/03/2012    Procedure: COLONOSCOPY;  Surgeon: Cleotis Nipper, MD;  Location: [facility] ENDOSCOPY;  Service: Endoscopy;  Laterality: N/A;  . Givens capsule study N/A 11/04/2012    Procedure: GIVENS CAPSULE STUDY;  Surgeon: Cleotis Nipper, MD;  Location: [facility] ENDOSCOPY;  Service: Endoscopy;  Laterality: N/A;  . Enteroscopy N/A 11/08/2012    Procedure: ENTEROSCOPY;  Surgeon: Wonda Horner, MD;  Location: [facility] ENDOSCOPY;  Service: Endoscopy;  Laterality: N/A;  . Amputation Left 05/12/2013    Procedure: AMPUTATION RAY;  Surgeon: Newt Minion, MD;  Location: [place];  Service: Orthopedics;  Laterality: Left;  Left Foot 1st Ray Amputation  . Eye surgery Left     to remove scar tissue  . Amputation Left 06/09/2013    Procedure: AMPUTATION BELOW KNEE;  Surgeon: Newt Minion, MD;  Location: [place];  Service: Orthopedics;  Laterality: Left;  Left Below Knee Amputation and removal proximal screws IM tibial nail  . Hardware removal Left 06/09/2013    Procedure: HARDWARE REMOVAL;  Surgeon: Newt Minion, MD;  Location: [place];  Service: Orthopedics;  Laterality: Left;  Left Below Knee Amputation  and Removal proximal screws IM tibial nail  . Amputation Right 09/08/2013    Procedure: AMPUTATION BELOW KNEE;  Surgeon: Newt Minion, MD;  Location: [place];  Service: Orthopedics;  Laterality: Right;  Right Below Knee Amputation  . Amputation Right 10/11/2013    Procedure: AMPUTATION BELOW KNEE;  Surgeon: Newt Minion, MD;  Location: [place];  Service: Orthopedics;  Laterality: Right;  Right Below Knee Amputation Revision    Current Medications: Current Outpatient Prescriptions  Medication Sig Dispense Refill  . acetaminophen (TYLENOL) 500 MG tablet Take 500 mg by mouth every 6 (six) hours as needed for mild pain.    [name] aspirin EC 81 MG tablet Take 81 mg by mouth daily.    [name] atorvastatin (LIPITOR) 40 MG tablet Take 1 tablet (40 mg total) by mouth daily at 6 PM. 30 tablet 0  . calcium acetate (PHOSLO) 667 MG capsule Take 2,001 mg by mouth 3 (three) times daily with meals.    [name] diltiazem (CARDIZEM CD) 120 MG 24 hr capsule Take 1 capsule (120 mg total) by mouth daily. 90 capsule 3  . hydrOXYzine (VISTARIL) 25 MG capsule Take 25 mg by mouth 3 (three) times daily as needed for anxiety or itching.    [name] ketoconazole (NIZORAL) 2 % cream Apply 1 application topically daily. 30 g 1  . latanoprost  (XALATAN) 0.005 % ophthalmic solution place 1 drop into right eye at bedtime  0  . lidocaine-prilocaine (EMLA) cream APPLY A SMALL AMOUNT TO SKIN AT THE ACCESS SITE (AVF) AS DIRECTED BEFORE EACH DIALYSIS SESSION THREE DAYS A WEEK  6  . metoprolol succinate (TOPROL-XL) 100 MG 24 hr tablet Take 1 tablet (100 mg total) by mouth daily. Take with or immediately following a meal. 90 tablet 3  . PREVIDENT 5000 PLUS 1.1 % CREA dental cream Place 1 application onto teeth every morning.   6  . SENSIPAR 30 MG tablet Take 30 mg by mouth every evening.     . silver sulfADIAZINE (SILVADENE) 1 % cream Apply 1 application topically daily.    [name]  Skin Protectants, Misc. (EUCERIN) cream Apply 1 application topically as needed for dry skin.    [name] VOLTAREN 1 % GEL APPLY 2 GRAMS TO HANDS BID TO TID  0  . warfarin (COUMADIN) 2.5 MG tablet Take 0.5 tablets (1.25 mg total) by mouth daily at 6 PM. 30 tablet 0   No current facility-administered medications for this visit.     Allergies:   Review of patient's allergies indicates no known allergies.   Social History   Social History  . Marital Status: Married    Spouse Name: N/A  . Number of Children: 2  . Years of Education: 16   Occupational History  . disabled due to stroke    Social History Main Topics  . Smoking status: Never Smoker   . Smokeless tobacco: Never Used  . Alcohol Use: No  . Drug Use: No  . Sexual Activity: Yes    Birth Control/ Protection: None   Other Topics Concern  . 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   ROS:   Please see the history of present illness.  All other systems are reviewed and otherwise negative.    PHYSICAL EXAM:   VS:  BP 141/78 mmHg  Pulse 72  Ht  5\' 10"  (1.778 m)  Wt 185 lb (83.915 kg)  BMI 26.54 kg/m2  BMI: Body mass index is 26.54 kg/(m^2). GEN: Well nourished, well developed AAM, in no acute distress HEENT: normocephalic, atraumatic Neck: no JVD or masses Cardiac: RRR; no murmurs, rubs, or gallops, no edema UEs (s/p BKAs bilaterally) Respiratory:  clear to auscultation bilaterally, normal work of breathing GI: soft, nontender, nondistended, + BS MS: no deformity or atrophy Skin: warm and dry, no rash Neuro:  Alert and Oriented x 3, Strength and sensation are intact, follows commands Psych: flat affect  Wt Readings from Last 3 Encounters:  01/20/16 185 lb (83.915 kg)  01/09/16 197 lb (89.359 kg)  12/31/15 184 lb 4.9 oz (83.6 kg)      Studies/Labs Reviewed:   EKG:  EKG was ordered today and personally reviewed by me and demonstrates NSR 1st degree AVB and LBBB  Recent Labs: 06/13/2015: TSH 0.48 12/27/2015: Magnesium 2.2 12/28/2015: ALT 8* 12/31/2015: BUN 55*; Creatinine, Ser 8.92*; Hemoglobin 10.3*; Platelets 214; Potassium 5.0; Sodium 126*   Lipid Panel    Component Value Date/Time   CHOL 158 12/11/2015 1447   TRIG 90.0 12/11/2015 1447   HDL 29.70* 12/11/2015 1447   CHOLHDL 5 12/11/2015 1447   VLDL 18.0 12/11/2015 1447   LDLCALC 111* 12/11/2015 1447    Additional studies/ records that were reviewed today include: Summarized above.   ASSESSMENT & PLAN:   1. PAF - maintaining NSR. Continue Coumadin. CHADSVASC = 6 for CHF, HTN, diabetes, stroke, and vascular disease given bilateral BKAs. Reviewed importance of observing for any bleeding. Continue current regimen. 2. LBBB - nuclear stress test non-ischemic. 1st degree AVB also noted on EKG. Monitor for bradycardia. 3. Chronic combined CHF - appears euvolemic. Volume managed with HD.  4. HTN - continue current regimen. 5. H/o stroke - f/u liver/lipids in 6 weeks given statin increase. Will review with Dr. Harrington Challenger whether we are OK to stop aspirin since he  is now on  Coumadin.  Disposition: F/u with Dr. Harrington Challenger in 3-4 months. ESRD followed by Dr. Moshe Cipro.   Medication Adjustments/Labs and Tests Ordered: Current medicines are reviewed at length with the patient today.  Concerns regarding medicines are outlined above. Medication changes, Labs and Tests ordered today are summarized above and listed in the Patient Instructions accessible in Encounters.   Raechel Ache PA-C  01/20/2016 11:00 AM    [address], [place], [place]  [id number] Phone: [phone]; Fax: [phone]

## 2016-01-18 DIAGNOSIS — N2581 Secondary hyperparathyroidism of renal origin: Secondary | ICD-10-CM | POA: Diagnosis not present

## 2016-01-18 DIAGNOSIS — N186 End stage renal disease: Secondary | ICD-10-CM | POA: Diagnosis not present

## 2016-01-18 DIAGNOSIS — D631 Anemia in chronic kidney disease: Secondary | ICD-10-CM | POA: Diagnosis not present

## 2016-01-18 DIAGNOSIS — D509 Iron deficiency anemia, unspecified: Secondary | ICD-10-CM | POA: Diagnosis not present

## 2016-01-18 DIAGNOSIS — E1122 Type 2 diabetes mellitus with diabetic chronic kidney disease: Secondary | ICD-10-CM | POA: Diagnosis not present

## 2016-01-20 ENCOUNTER — Encounter: Payer: Self-pay | Admitting: Physician Assistant

## 2016-01-20 ENCOUNTER — Ambulatory Visit (INDEPENDENT_AMBULATORY_CARE_PROVIDER_SITE_OTHER): Payer: Medicare Other | Admitting: Physician Assistant

## 2016-01-20 VITALS — BP 141/78 | HR 72 | Ht 70.0 in | Wt 185.0 lb

## 2016-01-20 DIAGNOSIS — I447 Left bundle-branch block, unspecified: Secondary | ICD-10-CM | POA: Diagnosis not present

## 2016-01-20 DIAGNOSIS — I5042 Chronic combined systolic (congestive) and diastolic (congestive) heart failure: Secondary | ICD-10-CM | POA: Diagnosis not present

## 2016-01-20 DIAGNOSIS — Z8673 Personal history of transient ischemic attack (TIA), and cerebral infarction without residual deficits: Secondary | ICD-10-CM

## 2016-01-20 DIAGNOSIS — I10 Essential (primary) hypertension: Secondary | ICD-10-CM | POA: Diagnosis not present

## 2016-01-20 DIAGNOSIS — I480 Paroxysmal atrial fibrillation: Secondary | ICD-10-CM

## 2016-01-20 DIAGNOSIS — I639 Cerebral infarction, unspecified: Secondary | ICD-10-CM | POA: Diagnosis not present

## 2016-01-20 NOTE — Patient Instructions (Addendum)
Medication Instructions:  Your physician recommends that you continue on your current medications as directed. Please refer to the Current Medication list given to you today.   Labwork: 6 WEEKS:  Your physician recommends that you return for a FASTING lipid profile & LFT  Testing/Procedures: None ordered  Follow-Up: Your physician recommends that you schedule a follow-up appointment in: 3-4 MONTHS WITH DR. ROSS   Any Other Special Instructions Will Be Listed Below (If Applicable).     If you need a refill on your cardiac medications before your next appointment, please call your pharmacy.

## 2016-01-21 ENCOUNTER — Telehealth: Payer: Self-pay | Admitting: Physician Assistant

## 2016-01-21 DIAGNOSIS — D631 Anemia in chronic kidney disease: Secondary | ICD-10-CM | POA: Diagnosis not present

## 2016-01-21 DIAGNOSIS — D509 Iron deficiency anemia, unspecified: Secondary | ICD-10-CM | POA: Diagnosis not present

## 2016-01-21 DIAGNOSIS — N186 End stage renal disease: Secondary | ICD-10-CM | POA: Diagnosis not present

## 2016-01-21 DIAGNOSIS — N2581 Secondary hyperparathyroidism of renal origin: Secondary | ICD-10-CM | POA: Diagnosis not present

## 2016-01-21 DIAGNOSIS — E1122 Type 2 diabetes mellitus with diabetic chronic kidney disease: Secondary | ICD-10-CM | POA: Diagnosis not present

## 2016-01-21 NOTE — Telephone Encounter (Signed)
Please call patient - I discussed meds with Dr. Harrington Challenger. Since he is now on Coumadin, please discontinue aspirin. Dayna Dunn PA-C

## 2016-01-22 ENCOUNTER — Telehealth: Payer: Self-pay | Admitting: *Deleted

## 2016-01-22 NOTE — Telephone Encounter (Signed)
Per Melina Copa, PA-C, called pt to advise him that  - I discussed meds with Dr. Harrington Challenger. Since he is now on Coumadin, please discontinue aspirin. Dayna Dunn PA-C Pt agreeable with this plan.

## 2016-01-23 ENCOUNTER — Ambulatory Visit (INDEPENDENT_AMBULATORY_CARE_PROVIDER_SITE_OTHER): Payer: Medicare Other | Admitting: *Deleted

## 2016-01-23 DIAGNOSIS — I638 Other cerebral infarction: Secondary | ICD-10-CM | POA: Diagnosis not present

## 2016-01-23 DIAGNOSIS — I4891 Unspecified atrial fibrillation: Secondary | ICD-10-CM | POA: Diagnosis not present

## 2016-01-23 DIAGNOSIS — E1122 Type 2 diabetes mellitus with diabetic chronic kidney disease: Secondary | ICD-10-CM | POA: Diagnosis not present

## 2016-01-23 DIAGNOSIS — N2581 Secondary hyperparathyroidism of renal origin: Secondary | ICD-10-CM | POA: Diagnosis not present

## 2016-01-23 DIAGNOSIS — I6389 Other cerebral infarction: Secondary | ICD-10-CM

## 2016-01-23 DIAGNOSIS — N186 End stage renal disease: Secondary | ICD-10-CM | POA: Diagnosis not present

## 2016-01-23 DIAGNOSIS — D631 Anemia in chronic kidney disease: Secondary | ICD-10-CM | POA: Diagnosis not present

## 2016-01-23 DIAGNOSIS — D509 Iron deficiency anemia, unspecified: Secondary | ICD-10-CM | POA: Diagnosis not present

## 2016-01-23 LAB — POCT INR: INR: 2.1

## 2016-01-23 MED ORDER — WARFARIN SODIUM 2.5 MG PO TABS: 2.5000 mg | ORAL_TABLET | ORAL | Status: DC

## 2016-01-24 DIAGNOSIS — Z992 Dependence on renal dialysis: Secondary | ICD-10-CM | POA: Diagnosis not present

## 2016-01-24 DIAGNOSIS — N186 End stage renal disease: Secondary | ICD-10-CM | POA: Diagnosis not present

## 2016-01-24 DIAGNOSIS — E1122 Type 2 diabetes mellitus with diabetic chronic kidney disease: Secondary | ICD-10-CM | POA: Diagnosis not present

## 2016-01-25 DIAGNOSIS — N2581 Secondary hyperparathyroidism of renal origin: Secondary | ICD-10-CM | POA: Diagnosis not present

## 2016-01-25 DIAGNOSIS — D509 Iron deficiency anemia, unspecified: Secondary | ICD-10-CM | POA: Diagnosis not present

## 2016-01-25 DIAGNOSIS — E1122 Type 2 diabetes mellitus with diabetic chronic kidney disease: Secondary | ICD-10-CM | POA: Diagnosis not present

## 2016-01-25 DIAGNOSIS — N186 End stage renal disease: Secondary | ICD-10-CM | POA: Diagnosis not present

## 2016-01-25 DIAGNOSIS — D631 Anemia in chronic kidney disease: Secondary | ICD-10-CM | POA: Diagnosis not present

## 2016-01-28 DIAGNOSIS — D509 Iron deficiency anemia, unspecified: Secondary | ICD-10-CM | POA: Diagnosis not present

## 2016-01-28 DIAGNOSIS — E1122 Type 2 diabetes mellitus with diabetic chronic kidney disease: Secondary | ICD-10-CM | POA: Diagnosis not present

## 2016-01-28 DIAGNOSIS — D631 Anemia in chronic kidney disease: Secondary | ICD-10-CM | POA: Diagnosis not present

## 2016-01-28 DIAGNOSIS — N186 End stage renal disease: Secondary | ICD-10-CM | POA: Diagnosis not present

## 2016-01-28 DIAGNOSIS — N2581 Secondary hyperparathyroidism of renal origin: Secondary | ICD-10-CM | POA: Diagnosis not present

## 2016-01-30 ENCOUNTER — Ambulatory Visit (INDEPENDENT_AMBULATORY_CARE_PROVIDER_SITE_OTHER): Payer: Medicare Other | Admitting: *Deleted

## 2016-01-30 DIAGNOSIS — D631 Anemia in chronic kidney disease: Secondary | ICD-10-CM | POA: Diagnosis not present

## 2016-01-30 DIAGNOSIS — I638 Other cerebral infarction: Secondary | ICD-10-CM | POA: Diagnosis not present

## 2016-01-30 DIAGNOSIS — I6389 Other cerebral infarction: Secondary | ICD-10-CM

## 2016-01-30 DIAGNOSIS — I4891 Unspecified atrial fibrillation: Secondary | ICD-10-CM

## 2016-01-30 DIAGNOSIS — N2581 Secondary hyperparathyroidism of renal origin: Secondary | ICD-10-CM | POA: Diagnosis not present

## 2016-01-30 DIAGNOSIS — D509 Iron deficiency anemia, unspecified: Secondary | ICD-10-CM | POA: Diagnosis not present

## 2016-01-30 DIAGNOSIS — E1122 Type 2 diabetes mellitus with diabetic chronic kidney disease: Secondary | ICD-10-CM | POA: Diagnosis not present

## 2016-01-30 DIAGNOSIS — N186 End stage renal disease: Secondary | ICD-10-CM | POA: Diagnosis not present

## 2016-01-30 LAB — POCT INR: INR: 2.5

## 2016-02-01 DIAGNOSIS — D631 Anemia in chronic kidney disease: Secondary | ICD-10-CM | POA: Diagnosis not present

## 2016-02-01 DIAGNOSIS — N2581 Secondary hyperparathyroidism of renal origin: Secondary | ICD-10-CM | POA: Diagnosis not present

## 2016-02-01 DIAGNOSIS — N186 End stage renal disease: Secondary | ICD-10-CM | POA: Diagnosis not present

## 2016-02-01 DIAGNOSIS — E1122 Type 2 diabetes mellitus with diabetic chronic kidney disease: Secondary | ICD-10-CM | POA: Diagnosis not present

## 2016-02-01 DIAGNOSIS — D509 Iron deficiency anemia, unspecified: Secondary | ICD-10-CM | POA: Diagnosis not present

## 2016-02-06 ENCOUNTER — Ambulatory Visit (INDEPENDENT_AMBULATORY_CARE_PROVIDER_SITE_OTHER): Payer: Medicare Other | Admitting: *Deleted

## 2016-02-06 DIAGNOSIS — I638 Other cerebral infarction: Secondary | ICD-10-CM

## 2016-02-06 DIAGNOSIS — I4891 Unspecified atrial fibrillation: Secondary | ICD-10-CM | POA: Diagnosis not present

## 2016-02-06 DIAGNOSIS — N186 End stage renal disease: Secondary | ICD-10-CM | POA: Diagnosis not present

## 2016-02-06 DIAGNOSIS — D509 Iron deficiency anemia, unspecified: Secondary | ICD-10-CM | POA: Diagnosis not present

## 2016-02-06 DIAGNOSIS — E1122 Type 2 diabetes mellitus with diabetic chronic kidney disease: Secondary | ICD-10-CM | POA: Diagnosis not present

## 2016-02-06 DIAGNOSIS — D631 Anemia in chronic kidney disease: Secondary | ICD-10-CM | POA: Diagnosis not present

## 2016-02-06 DIAGNOSIS — N2581 Secondary hyperparathyroidism of renal origin: Secondary | ICD-10-CM | POA: Diagnosis not present

## 2016-02-06 DIAGNOSIS — I6389 Other cerebral infarction: Secondary | ICD-10-CM

## 2016-02-06 LAB — POCT INR: INR: 2.3

## 2016-02-08 ENCOUNTER — Other Ambulatory Visit: Payer: Self-pay | Admitting: Internal Medicine

## 2016-02-08 DIAGNOSIS — D509 Iron deficiency anemia, unspecified: Secondary | ICD-10-CM | POA: Diagnosis not present

## 2016-02-08 DIAGNOSIS — N186 End stage renal disease: Secondary | ICD-10-CM | POA: Diagnosis not present

## 2016-02-08 DIAGNOSIS — E1122 Type 2 diabetes mellitus with diabetic chronic kidney disease: Secondary | ICD-10-CM | POA: Diagnosis not present

## 2016-02-08 DIAGNOSIS — N2581 Secondary hyperparathyroidism of renal origin: Secondary | ICD-10-CM | POA: Diagnosis not present

## 2016-02-08 DIAGNOSIS — D631 Anemia in chronic kidney disease: Secondary | ICD-10-CM | POA: Diagnosis not present

## 2016-02-11 DIAGNOSIS — D631 Anemia in chronic kidney disease: Secondary | ICD-10-CM | POA: Diagnosis not present

## 2016-02-11 DIAGNOSIS — N186 End stage renal disease: Secondary | ICD-10-CM | POA: Diagnosis not present

## 2016-02-11 DIAGNOSIS — N2581 Secondary hyperparathyroidism of renal origin: Secondary | ICD-10-CM | POA: Diagnosis not present

## 2016-02-11 DIAGNOSIS — D509 Iron deficiency anemia, unspecified: Secondary | ICD-10-CM | POA: Diagnosis not present

## 2016-02-11 DIAGNOSIS — E1122 Type 2 diabetes mellitus with diabetic chronic kidney disease: Secondary | ICD-10-CM | POA: Diagnosis not present

## 2016-02-13 ENCOUNTER — Ambulatory Visit (INDEPENDENT_AMBULATORY_CARE_PROVIDER_SITE_OTHER): Payer: Medicare Other | Admitting: *Deleted

## 2016-02-13 DIAGNOSIS — N2581 Secondary hyperparathyroidism of renal origin: Secondary | ICD-10-CM | POA: Diagnosis not present

## 2016-02-13 DIAGNOSIS — I638 Other cerebral infarction: Secondary | ICD-10-CM | POA: Diagnosis not present

## 2016-02-13 DIAGNOSIS — I4891 Unspecified atrial fibrillation: Secondary | ICD-10-CM

## 2016-02-13 DIAGNOSIS — E1122 Type 2 diabetes mellitus with diabetic chronic kidney disease: Secondary | ICD-10-CM | POA: Diagnosis not present

## 2016-02-13 DIAGNOSIS — D631 Anemia in chronic kidney disease: Secondary | ICD-10-CM | POA: Diagnosis not present

## 2016-02-13 DIAGNOSIS — D509 Iron deficiency anemia, unspecified: Secondary | ICD-10-CM | POA: Diagnosis not present

## 2016-02-13 DIAGNOSIS — I6389 Other cerebral infarction: Secondary | ICD-10-CM

## 2016-02-13 DIAGNOSIS — N186 End stage renal disease: Secondary | ICD-10-CM | POA: Diagnosis not present

## 2016-02-13 LAB — POCT INR: INR: 2

## 2016-02-15 DIAGNOSIS — N2581 Secondary hyperparathyroidism of renal origin: Secondary | ICD-10-CM | POA: Diagnosis not present

## 2016-02-15 DIAGNOSIS — D509 Iron deficiency anemia, unspecified: Secondary | ICD-10-CM | POA: Diagnosis not present

## 2016-02-15 DIAGNOSIS — N186 End stage renal disease: Secondary | ICD-10-CM | POA: Diagnosis not present

## 2016-02-15 DIAGNOSIS — D631 Anemia in chronic kidney disease: Secondary | ICD-10-CM | POA: Diagnosis not present

## 2016-02-15 DIAGNOSIS — E1122 Type 2 diabetes mellitus with diabetic chronic kidney disease: Secondary | ICD-10-CM | POA: Diagnosis not present

## 2016-02-18 DIAGNOSIS — D631 Anemia in chronic kidney disease: Secondary | ICD-10-CM | POA: Diagnosis not present

## 2016-02-18 DIAGNOSIS — N186 End stage renal disease: Secondary | ICD-10-CM | POA: Diagnosis not present

## 2016-02-18 DIAGNOSIS — N2581 Secondary hyperparathyroidism of renal origin: Secondary | ICD-10-CM | POA: Diagnosis not present

## 2016-02-18 DIAGNOSIS — D509 Iron deficiency anemia, unspecified: Secondary | ICD-10-CM | POA: Diagnosis not present

## 2016-02-18 DIAGNOSIS — E1122 Type 2 diabetes mellitus with diabetic chronic kidney disease: Secondary | ICD-10-CM | POA: Diagnosis not present

## 2016-02-20 DIAGNOSIS — D509 Iron deficiency anemia, unspecified: Secondary | ICD-10-CM | POA: Diagnosis not present

## 2016-02-20 DIAGNOSIS — Z5181 Encounter for therapeutic drug level monitoring: Secondary | ICD-10-CM | POA: Diagnosis not present

## 2016-02-20 DIAGNOSIS — D631 Anemia in chronic kidney disease: Secondary | ICD-10-CM | POA: Diagnosis not present

## 2016-02-20 DIAGNOSIS — N2581 Secondary hyperparathyroidism of renal origin: Secondary | ICD-10-CM | POA: Diagnosis not present

## 2016-02-20 DIAGNOSIS — E1122 Type 2 diabetes mellitus with diabetic chronic kidney disease: Secondary | ICD-10-CM | POA: Diagnosis not present

## 2016-02-20 DIAGNOSIS — N186 End stage renal disease: Secondary | ICD-10-CM | POA: Diagnosis not present

## 2016-02-20 DIAGNOSIS — I4891 Unspecified atrial fibrillation: Secondary | ICD-10-CM | POA: Diagnosis not present

## 2016-02-22 DIAGNOSIS — D631 Anemia in chronic kidney disease: Secondary | ICD-10-CM | POA: Diagnosis not present

## 2016-02-22 DIAGNOSIS — N2581 Secondary hyperparathyroidism of renal origin: Secondary | ICD-10-CM | POA: Diagnosis not present

## 2016-02-22 DIAGNOSIS — E1122 Type 2 diabetes mellitus with diabetic chronic kidney disease: Secondary | ICD-10-CM | POA: Diagnosis not present

## 2016-02-22 DIAGNOSIS — N186 End stage renal disease: Secondary | ICD-10-CM | POA: Diagnosis not present

## 2016-02-22 DIAGNOSIS — D509 Iron deficiency anemia, unspecified: Secondary | ICD-10-CM | POA: Diagnosis not present

## 2016-02-24 DIAGNOSIS — Z992 Dependence on renal dialysis: Secondary | ICD-10-CM | POA: Diagnosis not present

## 2016-02-24 DIAGNOSIS — N186 End stage renal disease: Secondary | ICD-10-CM | POA: Diagnosis not present

## 2016-02-24 DIAGNOSIS — E1122 Type 2 diabetes mellitus with diabetic chronic kidney disease: Secondary | ICD-10-CM | POA: Diagnosis not present

## 2016-02-25 DIAGNOSIS — N186 End stage renal disease: Secondary | ICD-10-CM | POA: Diagnosis not present

## 2016-02-25 DIAGNOSIS — E1122 Type 2 diabetes mellitus with diabetic chronic kidney disease: Secondary | ICD-10-CM | POA: Diagnosis not present

## 2016-02-25 DIAGNOSIS — D631 Anemia in chronic kidney disease: Secondary | ICD-10-CM | POA: Diagnosis not present

## 2016-02-25 DIAGNOSIS — D509 Iron deficiency anemia, unspecified: Secondary | ICD-10-CM | POA: Diagnosis not present

## 2016-02-25 DIAGNOSIS — N2581 Secondary hyperparathyroidism of renal origin: Secondary | ICD-10-CM | POA: Diagnosis not present

## 2016-02-27 ENCOUNTER — Ambulatory Visit (INDEPENDENT_AMBULATORY_CARE_PROVIDER_SITE_OTHER): Payer: Medicare Other | Admitting: Pharmacist

## 2016-02-27 DIAGNOSIS — I4891 Unspecified atrial fibrillation: Secondary | ICD-10-CM | POA: Diagnosis not present

## 2016-02-27 DIAGNOSIS — I638 Other cerebral infarction: Secondary | ICD-10-CM

## 2016-02-27 DIAGNOSIS — I6389 Other cerebral infarction: Secondary | ICD-10-CM

## 2016-02-27 LAB — POCT INR: INR: 2.4

## 2016-02-29 DIAGNOSIS — D631 Anemia in chronic kidney disease: Secondary | ICD-10-CM | POA: Diagnosis not present

## 2016-02-29 DIAGNOSIS — N2581 Secondary hyperparathyroidism of renal origin: Secondary | ICD-10-CM | POA: Diagnosis not present

## 2016-02-29 DIAGNOSIS — N186 End stage renal disease: Secondary | ICD-10-CM | POA: Diagnosis not present

## 2016-02-29 DIAGNOSIS — E1122 Type 2 diabetes mellitus with diabetic chronic kidney disease: Secondary | ICD-10-CM | POA: Diagnosis not present

## 2016-02-29 DIAGNOSIS — D509 Iron deficiency anemia, unspecified: Secondary | ICD-10-CM | POA: Diagnosis not present

## 2016-03-02 ENCOUNTER — Other Ambulatory Visit: Payer: Self-pay

## 2016-03-03 DIAGNOSIS — N186 End stage renal disease: Secondary | ICD-10-CM | POA: Diagnosis not present

## 2016-03-03 DIAGNOSIS — D509 Iron deficiency anemia, unspecified: Secondary | ICD-10-CM | POA: Diagnosis not present

## 2016-03-03 DIAGNOSIS — D631 Anemia in chronic kidney disease: Secondary | ICD-10-CM | POA: Diagnosis not present

## 2016-03-03 DIAGNOSIS — N2581 Secondary hyperparathyroidism of renal origin: Secondary | ICD-10-CM | POA: Diagnosis not present

## 2016-03-03 DIAGNOSIS — E1122 Type 2 diabetes mellitus with diabetic chronic kidney disease: Secondary | ICD-10-CM | POA: Diagnosis not present

## 2016-03-05 ENCOUNTER — Telehealth: Payer: Self-pay

## 2016-03-05 DIAGNOSIS — E1122 Type 2 diabetes mellitus with diabetic chronic kidney disease: Secondary | ICD-10-CM | POA: Diagnosis not present

## 2016-03-05 DIAGNOSIS — D631 Anemia in chronic kidney disease: Secondary | ICD-10-CM | POA: Diagnosis not present

## 2016-03-05 DIAGNOSIS — D509 Iron deficiency anemia, unspecified: Secondary | ICD-10-CM | POA: Diagnosis not present

## 2016-03-05 DIAGNOSIS — N186 End stage renal disease: Secondary | ICD-10-CM | POA: Diagnosis not present

## 2016-03-05 DIAGNOSIS — N2581 Secondary hyperparathyroidism of renal origin: Secondary | ICD-10-CM | POA: Diagnosis not present

## 2016-03-05 MED ORDER — ATORVASTATIN CALCIUM 40 MG PO TABS: 40.0000 mg | ORAL_TABLET | Freq: Every day | ORAL | 0 refills | Status: DC

## 2016-03-05 NOTE — Telephone Encounter (Signed)
Medication refill has been sent to pharmacy

## 2016-03-05 NOTE — Telephone Encounter (Signed)
atorvastatin (LIPITOR) 40 MG tablet   Patient is requesting a refill on this medication. States patient is out of the medication, she had called last week to refill with no answer yet. Please follow up, Thank you.

## 2016-03-07 DIAGNOSIS — E1122 Type 2 diabetes mellitus with diabetic chronic kidney disease: Secondary | ICD-10-CM | POA: Diagnosis not present

## 2016-03-07 DIAGNOSIS — D631 Anemia in chronic kidney disease: Secondary | ICD-10-CM | POA: Diagnosis not present

## 2016-03-07 DIAGNOSIS — N2581 Secondary hyperparathyroidism of renal origin: Secondary | ICD-10-CM | POA: Diagnosis not present

## 2016-03-07 DIAGNOSIS — D509 Iron deficiency anemia, unspecified: Secondary | ICD-10-CM | POA: Diagnosis not present

## 2016-03-07 DIAGNOSIS — N186 End stage renal disease: Secondary | ICD-10-CM | POA: Diagnosis not present

## 2016-03-10 DIAGNOSIS — N2581 Secondary hyperparathyroidism of renal origin: Secondary | ICD-10-CM | POA: Diagnosis not present

## 2016-03-10 DIAGNOSIS — E1122 Type 2 diabetes mellitus with diabetic chronic kidney disease: Secondary | ICD-10-CM | POA: Diagnosis not present

## 2016-03-10 DIAGNOSIS — N186 End stage renal disease: Secondary | ICD-10-CM | POA: Diagnosis not present

## 2016-03-10 DIAGNOSIS — D631 Anemia in chronic kidney disease: Secondary | ICD-10-CM | POA: Diagnosis not present

## 2016-03-10 DIAGNOSIS — D509 Iron deficiency anemia, unspecified: Secondary | ICD-10-CM | POA: Diagnosis not present

## 2016-03-11 ENCOUNTER — Ambulatory Visit (INDEPENDENT_AMBULATORY_CARE_PROVIDER_SITE_OTHER): Payer: Medicare Other | Admitting: Internal Medicine

## 2016-03-11 VITALS — BP 143/92 | HR 69 | Temp 98.2°F

## 2016-03-11 DIAGNOSIS — I639 Cerebral infarction, unspecified: Secondary | ICD-10-CM

## 2016-03-11 DIAGNOSIS — K7400 Hepatic fibrosis, unspecified: Secondary | ICD-10-CM

## 2016-03-11 DIAGNOSIS — B182 Chronic viral hepatitis C: Secondary | ICD-10-CM | POA: Diagnosis not present

## 2016-03-11 NOTE — Assessment & Plan Note (Signed)
With F2/3, no alcohol or other issues, no indication for [facility] screening or follow up.  I previously mentioned repeating the elastography but I don't feel it is indicated now.

## 2016-03-11 NOTE — Progress Notes (Signed)
   Subjective:    Patient ID: Oscar Castillo, male    DOB: [birth year], 65 y.o.   MRN: [id number]  HPI Here for follow up of hepatitis C.  Has started elbasvir/grazoprevir and now completed his course 4 months ago.  No issues with headache or fatigue.  No complaints.  Genotype 1b, viral load of 335,000, then 16 copies on treatment.  End of treatment negative.     Review of Systems  Constitutional: Negative for fatigue.  Gastrointestinal: Negative for nausea.  Skin: Negative for rash.  Neurological: Negative for dizziness and headaches.       Objective:   Physical Exam  Constitutional: He appears well-developed and well-nourished. No distress.  Eyes: No scleral icterus.  Cardiovascular: Normal rate, regular rhythm and normal heart sounds.   No murmur heard. Skin: No rash noted.          Assessment & Plan:

## 2016-03-12 DIAGNOSIS — D631 Anemia in chronic kidney disease: Secondary | ICD-10-CM | POA: Diagnosis not present

## 2016-03-12 DIAGNOSIS — N186 End stage renal disease: Secondary | ICD-10-CM | POA: Diagnosis not present

## 2016-03-12 DIAGNOSIS — E1122 Type 2 diabetes mellitus with diabetic chronic kidney disease: Secondary | ICD-10-CM | POA: Diagnosis not present

## 2016-03-12 DIAGNOSIS — N2581 Secondary hyperparathyroidism of renal origin: Secondary | ICD-10-CM | POA: Diagnosis not present

## 2016-03-12 DIAGNOSIS — D509 Iron deficiency anemia, unspecified: Secondary | ICD-10-CM | POA: Diagnosis not present

## 2016-03-13 DIAGNOSIS — E782 Mixed hyperlipidemia: Secondary | ICD-10-CM | POA: Insufficient documentation

## 2016-03-13 LAB — HEPATITIS C RNA QUANTITATIVE: HCV QUANT: NOT DETECTED [IU]/mL (ref <15)

## 2016-03-16 ENCOUNTER — Telehealth: Payer: Self-pay | Admitting: *Deleted

## 2016-03-16 NOTE — Assessment & Plan Note (Signed)
Last lab to confirm cure and was negative.  No follow up indicated

## 2016-03-16 NOTE — Telephone Encounter (Signed)
Pt returned call and RN shared Dr. Henreitta Leber message.  Pt expressed his thanks.

## 2016-03-16 NOTE — Telephone Encounter (Signed)
Left message for pt to return call to receive Dr. Henreitta Leber comments.

## 2016-03-16 NOTE — Telephone Encounter (Signed)
-----   Message from Thayer Headings, MD sent at 03/16/2016  1:20 PM EDT ----- Please let him know his final HCV viral load is negative and now considered cured. Thanks!

## 2016-03-17 DIAGNOSIS — N2581 Secondary hyperparathyroidism of renal origin: Secondary | ICD-10-CM | POA: Diagnosis not present

## 2016-03-17 DIAGNOSIS — D631 Anemia in chronic kidney disease: Secondary | ICD-10-CM | POA: Diagnosis not present

## 2016-03-17 DIAGNOSIS — N186 End stage renal disease: Secondary | ICD-10-CM | POA: Diagnosis not present

## 2016-03-17 DIAGNOSIS — E1122 Type 2 diabetes mellitus with diabetic chronic kidney disease: Secondary | ICD-10-CM | POA: Diagnosis not present

## 2016-03-17 DIAGNOSIS — D509 Iron deficiency anemia, unspecified: Secondary | ICD-10-CM | POA: Diagnosis not present

## 2016-03-18 ENCOUNTER — Other Ambulatory Visit: Payer: Self-pay | Admitting: Internal Medicine

## 2016-03-19 DIAGNOSIS — N2581 Secondary hyperparathyroidism of renal origin: Secondary | ICD-10-CM | POA: Diagnosis not present

## 2016-03-19 DIAGNOSIS — E1122 Type 2 diabetes mellitus with diabetic chronic kidney disease: Secondary | ICD-10-CM | POA: Diagnosis not present

## 2016-03-19 DIAGNOSIS — D509 Iron deficiency anemia, unspecified: Secondary | ICD-10-CM | POA: Diagnosis not present

## 2016-03-19 DIAGNOSIS — N186 End stage renal disease: Secondary | ICD-10-CM | POA: Diagnosis not present

## 2016-03-19 DIAGNOSIS — Z5181 Encounter for therapeutic drug level monitoring: Secondary | ICD-10-CM | POA: Diagnosis not present

## 2016-03-19 DIAGNOSIS — I4891 Unspecified atrial fibrillation: Secondary | ICD-10-CM | POA: Diagnosis not present

## 2016-03-19 DIAGNOSIS — D631 Anemia in chronic kidney disease: Secondary | ICD-10-CM | POA: Diagnosis not present

## 2016-03-20 DIAGNOSIS — Z992 Dependence on renal dialysis: Secondary | ICD-10-CM | POA: Diagnosis not present

## 2016-03-20 DIAGNOSIS — I871 Compression of vein: Secondary | ICD-10-CM | POA: Diagnosis not present

## 2016-03-20 DIAGNOSIS — T82858D Stenosis of vascular prosthetic devices, implants and grafts, subsequent encounter: Secondary | ICD-10-CM | POA: Diagnosis not present

## 2016-03-20 DIAGNOSIS — N186 End stage renal disease: Secondary | ICD-10-CM | POA: Diagnosis not present

## 2016-03-21 DIAGNOSIS — N2581 Secondary hyperparathyroidism of renal origin: Secondary | ICD-10-CM | POA: Diagnosis not present

## 2016-03-21 DIAGNOSIS — E1122 Type 2 diabetes mellitus with diabetic chronic kidney disease: Secondary | ICD-10-CM | POA: Diagnosis not present

## 2016-03-21 DIAGNOSIS — N186 End stage renal disease: Secondary | ICD-10-CM | POA: Diagnosis not present

## 2016-03-21 DIAGNOSIS — D631 Anemia in chronic kidney disease: Secondary | ICD-10-CM | POA: Diagnosis not present

## 2016-03-21 DIAGNOSIS — D509 Iron deficiency anemia, unspecified: Secondary | ICD-10-CM | POA: Diagnosis not present

## 2016-03-24 DIAGNOSIS — N2581 Secondary hyperparathyroidism of renal origin: Secondary | ICD-10-CM | POA: Diagnosis not present

## 2016-03-24 DIAGNOSIS — D509 Iron deficiency anemia, unspecified: Secondary | ICD-10-CM | POA: Diagnosis not present

## 2016-03-24 DIAGNOSIS — N186 End stage renal disease: Secondary | ICD-10-CM | POA: Diagnosis not present

## 2016-03-24 DIAGNOSIS — E1122 Type 2 diabetes mellitus with diabetic chronic kidney disease: Secondary | ICD-10-CM | POA: Diagnosis not present

## 2016-03-24 DIAGNOSIS — D631 Anemia in chronic kidney disease: Secondary | ICD-10-CM | POA: Diagnosis not present

## 2016-03-26 ENCOUNTER — Ambulatory Visit (INDEPENDENT_AMBULATORY_CARE_PROVIDER_SITE_OTHER): Payer: Medicare Other | Admitting: *Deleted

## 2016-03-26 DIAGNOSIS — Z992 Dependence on renal dialysis: Secondary | ICD-10-CM | POA: Diagnosis not present

## 2016-03-26 DIAGNOSIS — I638 Other cerebral infarction: Secondary | ICD-10-CM

## 2016-03-26 DIAGNOSIS — I4891 Unspecified atrial fibrillation: Secondary | ICD-10-CM

## 2016-03-26 DIAGNOSIS — E1122 Type 2 diabetes mellitus with diabetic chronic kidney disease: Secondary | ICD-10-CM | POA: Diagnosis not present

## 2016-03-26 DIAGNOSIS — N186 End stage renal disease: Secondary | ICD-10-CM | POA: Diagnosis not present

## 2016-03-26 DIAGNOSIS — I6389 Other cerebral infarction: Secondary | ICD-10-CM

## 2016-03-26 DIAGNOSIS — D631 Anemia in chronic kidney disease: Secondary | ICD-10-CM | POA: Diagnosis not present

## 2016-03-26 DIAGNOSIS — D509 Iron deficiency anemia, unspecified: Secondary | ICD-10-CM | POA: Diagnosis not present

## 2016-03-26 DIAGNOSIS — N2581 Secondary hyperparathyroidism of renal origin: Secondary | ICD-10-CM | POA: Diagnosis not present

## 2016-03-26 LAB — POCT INR: INR: 2.9

## 2016-03-28 DIAGNOSIS — N2581 Secondary hyperparathyroidism of renal origin: Secondary | ICD-10-CM | POA: Diagnosis not present

## 2016-03-28 DIAGNOSIS — N186 End stage renal disease: Secondary | ICD-10-CM | POA: Diagnosis not present

## 2016-03-28 DIAGNOSIS — Z23 Encounter for immunization: Secondary | ICD-10-CM | POA: Diagnosis not present

## 2016-03-31 ENCOUNTER — Encounter (HOSPITAL_COMMUNITY): Payer: Self-pay

## 2016-03-31 ENCOUNTER — Emergency Department (HOSPITAL_COMMUNITY)
Admission: EM | Admit: 2016-03-31 | Discharge: 2016-03-31 | Disposition: A | Discharge status: Home / Self Care | Payer: Medicare Other | Attending: Emergency Medicine | Admitting: Emergency Medicine

## 2016-03-31 DIAGNOSIS — I132 Hypertensive heart and chronic kidney disease with heart failure and with stage 5 chronic kidney disease, or end stage renal disease: Secondary | ICD-10-CM | POA: Insufficient documentation

## 2016-03-31 DIAGNOSIS — I5042 Chronic combined systolic (congestive) and diastolic (congestive) heart failure: Secondary | ICD-10-CM | POA: Insufficient documentation

## 2016-03-31 DIAGNOSIS — R197 Diarrhea, unspecified: Secondary | ICD-10-CM | POA: Insufficient documentation

## 2016-03-31 DIAGNOSIS — Z992 Dependence on renal dialysis: Secondary | ICD-10-CM | POA: Diagnosis not present

## 2016-03-31 DIAGNOSIS — Z23 Encounter for immunization: Secondary | ICD-10-CM | POA: Diagnosis not present

## 2016-03-31 DIAGNOSIS — Z7901 Long term (current) use of anticoagulants: Secondary | ICD-10-CM | POA: Diagnosis not present

## 2016-03-31 DIAGNOSIS — Z79899 Other long term (current) drug therapy: Secondary | ICD-10-CM | POA: Diagnosis not present

## 2016-03-31 DIAGNOSIS — Z8673 Personal history of transient ischemic attack (TIA), and cerebral infarction without residual deficits: Secondary | ICD-10-CM | POA: Insufficient documentation

## 2016-03-31 DIAGNOSIS — N186 End stage renal disease: Secondary | ICD-10-CM | POA: Insufficient documentation

## 2016-03-31 DIAGNOSIS — N2581 Secondary hyperparathyroidism of renal origin: Secondary | ICD-10-CM | POA: Diagnosis not present

## 2016-03-31 LAB — CBC
HEMATOCRIT: 36.9 % — AB (ref 39.0–52.0)
HEMOGLOBIN: 11.9 g/dL — AB (ref 13.0–17.0)
MCH: 28.1 pg (ref 26.0–34.0)
MCHC: 32.2 g/dL (ref 30.0–36.0)
MCV: 87 fL (ref 78.0–100.0)
Platelets: 209 10*3/uL (ref 150–400)
RBC: 4.24 MIL/uL (ref 4.22–5.81)
RDW: 15.6 % — AB (ref 11.5–15.5)
WBC: 9.4 10*3/uL (ref 4.0–10.5)

## 2016-03-31 LAB — COMPREHENSIVE METABOLIC PANEL
ALBUMIN: 3.7 g/dL (ref 3.5–5.0)
ALT: 13 U/L — ABNORMAL LOW (ref 17–63)
ANION GAP: 11 (ref 5–15)
AST: 15 U/L (ref 15–41)
Alkaline Phosphatase: 38 U/L (ref 38–126)
BUN: 37 mg/dL — AB (ref 6–20)
CHLORIDE: 102 mmol/L (ref 101–111)
CO2: 21 mmol/L — ABNORMAL LOW (ref 22–32)
Calcium: 8.5 mg/dL — ABNORMAL LOW (ref 8.9–10.3)
Creatinine, Ser: 7.39 mg/dL — ABNORMAL HIGH (ref 0.61–1.24)
GFR calc Af Amer: 8 mL/min — ABNORMAL LOW (ref >60)
GFR, EST NON AFRICAN AMERICAN: 7 mL/min — AB (ref >60)
Glucose, Bld: 143 mg/dL — ABNORMAL HIGH (ref 65–99)
POTASSIUM: 4.6 mmol/L (ref 3.5–5.1)
Sodium: 134 mmol/L — ABNORMAL LOW (ref 135–145)
Total Bilirubin: 0.6 mg/dL (ref 0.3–1.2)
Total Protein: 8.1 g/dL (ref 6.5–8.1)

## 2016-03-31 LAB — LIPASE, BLOOD: LIPASE: 72 U/L — AB (ref 11–51)

## 2016-03-31 MED ORDER — LOPERAMIDE HCL 2 MG PO CAPS: 2.0000 mg | ORAL_CAPSULE | ORAL | 0 refills | Status: DC | PRN

## 2016-03-31 NOTE — ED Notes (Signed)
No urinalysis ordered at this time d/t pt does not make urine.

## 2016-03-31 NOTE — ED Notes (Signed)
Family niot in wr. Will wait for discharge

## 2016-03-31 NOTE — ED Provider Notes (Signed)
[facility] DEPT Provider Note   CSN: [id number] Arrival date & time: 03/31/16  1052   History   Chief Complaint Chief Complaint  Patient presents with  . Diarrhea    HPI  Oscar Castillo is a 65 y.o. male with pmhx of diabetes and ESRD presenting with 2-3 week hx of diarrhea.  Indicates diarrhea has worsened. Patient with 4-5 watery bowel movements. Per patient had diarrhea in the past with a past diagnosis of C. Diff. Patient denies had nasuea and vomitting. No fever or chills. No black tarry stools. No hematochezia. Patient denies seeing anyone about this previously. No abdominal pain. Patient has slightly decreased appetite. Patient careful with his fluids due to ESRD. Denies T/Th/Sa. Denies any recent antibotics.       Past Medical History:  Diagnosis Date  . Anemia   . Antral ulcer 2014   small  . BACK PAIN, LUMBAR, CHRONIC   . BENIGN PROSTATIC HYPERTROPHY   . CEREBROVASCULAR ACCIDENT, HX OF   . CHOLELITHIASIS   . Chronic combined systolic and diastolic CHF (congestive heart failure) ([place])   . Complication of anesthesia    wife states pt had trouble waking up with his last surgery in Nov., 2014  . DEPRESSION   . DIABETES MELLITUS, TYPE II   . ERECTILE DYSFUNCTION   . ESRD on hemodialysis ([place])    ESRD due to DM/HTN. Started dialysis in November 2013.  HD TTS at [facility] on [place].  [name] GERD   . Hemorrhoids   . HEPATITIS C, HX OF   . History of Clostridium difficile   . HYPERTENSION   . LBBB (left bundle branch block)   . Morbid obesity ([place])   . NEPHROLITHIASIS, HX OF   . PAF (paroxysmal atrial fibrillation) ([place])    a. Dx 12/2015.  . S/P bilateral BKA (below knee amputation) [facility])     Patient Active Problem List   Diagnosis Date Noted  . New onset a-fib ([place]) 12/27/2015  . Glaucoma 12/27/2015  . LBBB (left bundle branch block)   . Rash 11/15/2015  . Cramping of hands 11/15/2015  . Liver fibrosis ([place]) 10/07/2015  . Diarrhea 08/14/2014  .  Dehydration 10/02/2013  . Fever 10/02/2013  . Altered mental status 10/01/2013  . Encephalopathy, toxic 10/01/2013  . Encephalopathy, metabolic 99991111  . CVA (cerebral vascular accident) ([place]) 09/28/2013  . FTT (failure to thrive) in adult 09/28/2013  . Acute confusional state 09/28/2013  . Ulcer of sacral region, stage 3 ([place]) 09/26/2013  . S/P BKA (below knee amputation) bilateral ([place]) 09/08/2013  . ESRD on hemodialysis ([place]) 05/09/2013  . TIA (transient ischemic attack) 02/20/2013  . Acute blood loss anemia 10/28/2012  . Chronic combined systolic and diastolic congestive heart failure 10/28/2012  . Obstipation 10/28/2012  . GI bleed 10/27/2012  . Mass in rectum 10/27/2012  . DM (diabetes mellitus) type I controlled with renal manifestation ([place]) 09/08/2012  . LVH (left ventricular hypertrophy)-severe concentric 06/13/2012  . Anemia due to chronic illness 06/12/2012  . Hyperlipidemia 01/30/2011  . CHOLELITHIASIS 08/01/2010  . BENIGN PROSTATIC HYPERTROPHY 08/01/2010  . CEREBROVASCULAR ACCIDENT, HX OF 08/06/2009  . Depression 03/18/2009  . Acute combined systolic and diastolic heart failure, NYHA class 2-EF 45% 03/18/2009  . NEPHROLITHIASIS, HX OF 03/18/2009  . Morbid obesity ([place]) 03/25/2007  . Essential hypertension 03/25/2007  . GERD 03/25/2007  . Chronic hepatitis C without hepatic coma ([place]) 03/25/2007    Past Surgical History:  Procedure Laterality Date  .  AMPUTATION Left 05/12/2013   Procedure: AMPUTATION RAY;  Surgeon: Newt Minion, MD;  Location: [place];  Service: Orthopedics;  Laterality: Left;  Left Foot 1st Ray Amputation  . AMPUTATION Left 06/09/2013   Procedure: AMPUTATION BELOW KNEE;  Surgeon: Newt Minion, MD;  Location: [place];  Service: Orthopedics;  Laterality: Left;  Left Below Knee Amputation and removal proximal screws IM tibial nail  . AMPUTATION Right 09/08/2013   Procedure: AMPUTATION BELOW KNEE;  Surgeon: Newt Minion, MD;  Location: [place];   Service: Orthopedics;  Laterality: Right;  Right Below Knee Amputation  . AMPUTATION Right 10/11/2013   Procedure: AMPUTATION BELOW KNEE;  Surgeon: Newt Minion, MD;  Location: [place];  Service: Orthopedics;  Laterality: Right;  Right Below Knee Amputation Revision  . AV FISTULA PLACEMENT  06/14/2012   Procedure: ARTERIOVENOUS (AV) FISTULA CREATION;  Surgeon: Angelia Mould, MD;  Location: [facility] OR;  Service: Vascular;  Laterality: Left;  Left basilic vein transposition with fistula.  . COLONOSCOPY N/A 10/28/2012   Procedure: COLONOSCOPY;  Surgeon: Jeryl Columbia, MD;  Location: [facility] ENDOSCOPY;  Service: Endoscopy;  Laterality: N/A;  . COLONOSCOPY N/A 11/02/2012   Procedure: COLONOSCOPY;  Surgeon: Cleotis Nipper, MD;  Location: [facility] ENDOSCOPY;  Service: Endoscopy;  Laterality: N/A;  . COLONOSCOPY N/A 11/03/2012   Procedure: COLONOSCOPY;  Surgeon: Cleotis Nipper, MD;  Location: [facility] ENDOSCOPY;  Service: Endoscopy;  Laterality: N/A;  . ENTEROSCOPY N/A 11/08/2012   Procedure: ENTEROSCOPY;  Surgeon: Wonda Horner, MD;  Location: [facility] ENDOSCOPY;  Service: Endoscopy;  Laterality: N/A;  . ESOPHAGOGASTRODUODENOSCOPY N/A 11/02/2012   Procedure: ESOPHAGOGASTRODUODENOSCOPY (EGD);  Surgeon: Cleotis Nipper, MD;  Location: [facility] ENDOSCOPY;  Service: Endoscopy;  Laterality: N/A;  . EYE SURGERY Left    to remove scar tissue  . GIVENS CAPSULE STUDY N/A 11/04/2012   Procedure: GIVENS CAPSULE STUDY;  Surgeon: Cleotis Nipper, MD;  Location: [facility] ENDOSCOPY;  Service: Endoscopy;  Laterality: N/A;  . HARDWARE REMOVAL Left 06/09/2013   Procedure: HARDWARE REMOVAL;  Surgeon: Newt Minion, MD;  Location: [place];  Service: Orthopedics;  Laterality: Left;  Left Below Knee Amputation  and Removal proximal screws IM tibial nail  . NEPHRECTOMY     partial RR  . ORIF FIBULA FRACTURE Left 09/09/2012   Procedure: OPEN REDUCTION INTERNAL FIXATION (ORIF) FIBULA FRACTURE;  Surgeon: Johnny Bridge, MD;  Location: [place];  Service: Orthopedics;   Laterality: Left;  . TIBIA IM NAIL INSERTION Left 09/09/2012   Procedure: INTRAMEDULLARY (IM) NAIL TIBIAL;  Surgeon: Johnny Bridge, MD;  Location: [place];  Service: Orthopedics;  Laterality: Left;  left tibial nail and open reduction internal fixation left fibula fracture       Home Medications    Prior to Admission medications   Medication Sig Start Date End Date Taking? Authorizing Provider  acetaminophen (TYLENOL) 500 MG tablet Take 500 mg by mouth every 6 (six) hours as needed for mild pain.    Historical Provider, MD  atorvastatin (LIPITOR) 40 MG tablet Take 1 tablet (40 mg total) by mouth daily at 6 PM. 03/05/16   [name], MD  calcium acetate (PHOSLO) 667 MG capsule Take 2,001 mg by mouth 3 (three) times daily with meals.    Historical Provider, MD  diltiazem (CARDIZEM CD) 120 MG 24 hr capsule Take 1 capsule (120 mg total) by mouth daily. 01/09/16   [name], MD  hydrOXYzine (ATARAX/VISTARIL) 25 MG tablet TAKE 1 TABLET(25  MG) BY MOUTH EVERY 6 HOURS AS NEEDED 03/18/16   [name], MD  hydrOXYzine (VISTARIL) 25 MG capsule Take 25 mg by mouth 3 (three) times daily as needed for anxiety or itching.    Historical Provider, MD  ketoconazole (NIZORAL) 2 % cream Apply 1 application topically daily. 11/13/15   [name], MD  latanoprost (XALATAN) 0.005 % ophthalmic solution place 1 drop into right eye at bedtime 09/13/15   Historical Provider, MD  lidocaine-prilocaine (EMLA) cream APPLY A SMALL AMOUNT TO SKIN AT THE ACCESS SITE (AVF) AS DIRECTED BEFORE EACH DIALYSIS SESSION THREE DAYS A WEEK 06/03/15   Historical Provider, MD  metoprolol succinate (TOPROL-XL) 100 MG 24 hr tablet Take 1 tablet (100 mg total) by mouth daily. Take with or immediately following a meal. 01/09/16   [name], MD  PREVIDENT 5000 PLUS 1.1 % CREA dental cream Place 1 application onto teeth every morning.  04/24/15   Historical Provider, MD  SENSIPAR 30 MG tablet Take 30 mg by mouth every evening.  01/09/15    Historical Provider, MD  silver sulfADIAZINE (SILVADENE) 1 % cream Apply 1 application topically daily.    Historical Provider, MD  Skin Protectants, Misc. (EUCERIN) cream Apply 1 application topically as needed for dry skin.    Historical Provider, MD  VOLTAREN 1 % GEL APPLY 2 GRAMS TO HANDS BID TO TID 09/19/15   Historical Provider, MD  warfarin (COUMADIN) 2.5 MG tablet Take 1 tablet (2.5 mg total) by mouth as directed. 01/23/16   Fay Records, MD    Family History Family History  Problem Relation Age of Onset  . Diabetes Mother   . Hypertension Mother   . Heart attack Father   . Hypertension Father   . Coronary artery disease Other     Social History Social History  Substance Use Topics  . Smoking status: Never Smoker  . Smokeless tobacco: Never Used  . Alcohol use No     Allergies   Review of patient's allergies indicates no known allergies.   Review of Systems Review of Systems  Constitutional: Negative for chills and fever.  Respiratory: Negative for shortness of breath.   Gastrointestinal: Positive for diarrhea. Negative for abdominal distention, abdominal pain, nausea and vomiting.  Genitourinary: Negative for difficulty urinating and dysuria.  Neurological: Negative for dizziness and light-headedness.     Physical Exam Updated Vital Signs BP 141/91 (BP Location: Right Arm)   Pulse 75   Temp 98.7 F (37.1 C) (Oral)   Resp 16   Ht 5\' 10"  (1.778 m)   Wt 104.3 kg   SpO2 99%   BMI 33.00 kg/m   Physical Exam  Constitutional: He is oriented to person, place, and time. He appears well-developed and well-nourished.  HENT:  Head: Normocephalic and atraumatic.  Mouth/Throat: Oropharynx is clear and moist.  Eyes: Conjunctivae and EOM are normal.  Neck: Normal range of motion.  Cardiovascular: Normal rate, regular rhythm, normal heart sounds and intact distal pulses.   Pulmonary/Chest: Effort normal and breath sounds normal.  Abdominal: Soft. Bowel sounds are  normal. He exhibits no distension. There is no tenderness.  Musculoskeletal: Normal range of motion.  Neurological: He is alert and oriented to person, place, and time.  Skin: Skin is warm. Capillary refill takes less than 2 seconds.     ED Treatments / Results  Labs (all labs ordered are listed, but only abnormal results are displayed) Labs Reviewed  LIPASE, BLOOD - Abnormal; Notable for  the following:       Result Value   Lipase 72 (*)    All other components within normal limits  COMPREHENSIVE METABOLIC PANEL - Abnormal; Notable for the following:    Sodium 134 (*)    CO2 21 (*)    Glucose, Bld 143 (*)    BUN 37 (*)    Creatinine, Ser 7.39 (*)    Calcium 8.5 (*)    ALT 13 (*)    GFR calc non Af Amer 7 (*)    GFR calc Af Amer 8 (*)    All other components within normal limits  CBC - Abnormal; Notable for the following:    Hemoglobin 11.9 (*)    HCT 36.9 (*)    RDW 15.6 (*)    All other components within normal limits    EKG  EKG Interpretation None       Radiology No results found.  Procedures Procedures (including critical care time)  Medications Ordered in ED Medications - No data to display   Initial Impression / Assessment and Plan / ED Course  I have reviewed the triage vital signs and the nursing notes.  Pertinent labs & imaging results that were available during my care of the patient were reviewed by me and considered in my medical decision making (see chart for details).  Clinical Course   Patient presenting with subacute hx of diarrhea. No hx of nausea vomiting/diarrhea. CMET consistent with ESRD. Patient otherwise well appearing. Provide Loperamide for symptomatic control. Patient to follow up with primary care physician for continued work up of this subacute diarrhea. GI panel pending.    Final Clinical Impressions(s) / ED Diagnoses   Final diagnoses:  None    New Prescriptions New Prescriptions   No medications on file     Lamine Laton  Cletis Media, MD 03/31/16 [place], MD 03/31/16 1530

## 2016-03-31 NOTE — ED Notes (Signed)
Pt cleaned of moderate amount of loose stool

## 2016-03-31 NOTE — Care Management Note (Signed)
Case Management Note  Patient Details  Name: Oscar Castillo MRN: [id number] Date of Birth: 12-08-1950  Subjective/Objective:                  65 y.o. male with pmhx of diabetes and ESRD presenting with 2-3 week hx of diarrhea. ?From home with spouse.  Action/Plan: Follow for disposition needs.   Expected Discharge Date:  04/02/16               Expected Discharge Plan:  Home/Self Care  In-House Referral:  NA  Discharge planning Services  CM Consult  Post Acute Care Choice:    Choice offered to:     DME Arranged:    DME Agency:     HH Arranged:    HH Agency:     Status of Service:  In process, will continue to follow  If discussed at Long Length of Stay Meetings, dates discussed:    Additional Comments: Pt is T/TH/Sa dialysis pt  Fuller Mandril, RN 03/31/2016, 3:38 PM

## 2016-03-31 NOTE — Discharge Instructions (Addendum)
You will need to follow up with your primary care physician to determine if further investigation is needed. You can take loperamide as needed for diarrhea.

## 2016-03-31 NOTE — ED Triage Notes (Signed)
Pt is T/TH/Sa dialysis pt with recurrent diarrhea. He has hx of c-diff. Pt reports emesis episode last week and feeling nauseous at dialysis today. 4-5 diarrhea episodes per day at home.

## 2016-04-01 LAB — GASTROINTESTINAL PANEL BY PCR, STOOL (REPLACES STOOL CULTURE)
Adenovirus F40/41: NOT DETECTED
Astrovirus: NOT DETECTED
CRYPTOSPORIDIUM: NOT DETECTED
Campylobacter species: NOT DETECTED
Cyclospora cayetanensis: NOT DETECTED
E. coli O157: NOT DETECTED
ENTAMOEBA HISTOLYTICA: NOT DETECTED
ENTEROAGGREGATIVE E COLI (EAEC): NOT DETECTED
Enteropathogenic E coli (EPEC): NOT DETECTED
Enterotoxigenic E coli (ETEC): NOT DETECTED
GIARDIA LAMBLIA: NOT DETECTED
Norovirus GI/GII: NOT DETECTED
Plesimonas shigelloides: NOT DETECTED
Rotavirus A: NOT DETECTED
SALMONELLA SPECIES: NOT DETECTED
SHIGELLA/ENTEROINVASIVE E COLI (EIEC): NOT DETECTED
Sapovirus (I, II, IV, and V): NOT DETECTED
Shiga like toxin producing E coli (STEC): NOT DETECTED
VIBRIO CHOLERAE: NOT DETECTED
Vibrio species: NOT DETECTED
YERSINIA ENTEROCOLITICA: NOT DETECTED

## 2016-04-03 ENCOUNTER — Ambulatory Visit (INDEPENDENT_AMBULATORY_CARE_PROVIDER_SITE_OTHER): Payer: Medicare Other | Admitting: Internal Medicine

## 2016-04-03 ENCOUNTER — Encounter: Payer: Self-pay | Admitting: Internal Medicine

## 2016-04-03 VITALS — BP 140/80 | HR 80 | Temp 98.0°F | Resp 20 | Wt 230.0 lb

## 2016-04-03 DIAGNOSIS — N184 Chronic kidney disease, stage 4 (severe): Secondary | ICD-10-CM | POA: Diagnosis not present

## 2016-04-03 DIAGNOSIS — I10 Essential (primary) hypertension: Secondary | ICD-10-CM | POA: Diagnosis not present

## 2016-04-03 DIAGNOSIS — E1022 Type 1 diabetes mellitus with diabetic chronic kidney disease: Secondary | ICD-10-CM

## 2016-04-03 DIAGNOSIS — I639 Cerebral infarction, unspecified: Secondary | ICD-10-CM

## 2016-04-03 DIAGNOSIS — R197 Diarrhea, unspecified: Secondary | ICD-10-CM | POA: Diagnosis not present

## 2016-04-03 NOTE — Patient Instructions (Signed)
Please continue all other medications as before, and refills have been done if requested.  Please have the pharmacy call with any other refills you may need.  Please continue your efforts at being more active, low cholesterol diet, and weight control.  You are otherwise up to date with prevention measures today.  Please keep your appointments with your specialists as you may have planned   

## 2016-04-03 NOTE — Progress Notes (Signed)
Subjective:    Patient ID: Oscar Castillo, male    DOB: May 02, 1951, 65 y.o.   MRN: [id number]  HPI  65 y.o. male with pmhx of diabetes and ESRD presenting with 2-3 week hx of diarrhea.  Indicates diarrhea has worsened. Patient with 4-5 watery bowel movements. Per patient had diarrhea in the past with a past diagnosis of C. Diff. Patient denies had nasuea and vomiting. No fever or chills. No black tarry stools. No hematochezia or abd pain.  Denies worsening reflux, dysphagia.  Seen at ED sept 5 and diarrhea well controlled and may be resolved since then.  Pt without specific complaint today.  Pt denies chest pain, increased sob or doe, wheezing, orthopnea, PND, increased LE swelling, palpitations, dizziness or syncope.  Pt denies new neurological symptoms such as new headache, or facial or extremity weakness or numbness.  Cont's HD on t-t-sat   Pt denies polydipsia, polyuria, or low sugar symptoms such as weakness or confusion improved with po intake  Past Medical History:  Diagnosis Date  . Anemia   . Antral ulcer 2014   small  . BACK PAIN, LUMBAR, CHRONIC   . BENIGN PROSTATIC HYPERTROPHY   . CEREBROVASCULAR ACCIDENT, HX OF   . CHOLELITHIASIS   . Chronic combined systolic and diastolic CHF (congestive heart failure) ([place])   . Complication of anesthesia    wife states pt had trouble waking up with his last surgery in Nov., 2014  . DEPRESSION   . DIABETES MELLITUS, TYPE II   . ERECTILE DYSFUNCTION   . ESRD on hemodialysis ([place])    ESRD due to DM/HTN. Started dialysis in November 2013.  HD TTS at [facility] on [place].  [name] GERD   . Hemorrhoids   . HEPATITIS C, HX OF   . History of Clostridium difficile   . HYPERTENSION   . LBBB (left bundle branch block)   . Morbid obesity ([place])   . NEPHROLITHIASIS, HX OF   . PAF (paroxysmal atrial fibrillation) ([place])    a. Dx 12/2015.  . S/P bilateral BKA (below knee amputation) [facility])    Past Surgical History:  Procedure Laterality Date  .  AMPUTATION Left 05/12/2013   Procedure: AMPUTATION RAY;  Surgeon: Newt Minion, MD;  Location: [place];  Service: Orthopedics;  Laterality: Left;  Left Foot 1st Ray Amputation  . AMPUTATION Left 06/09/2013   Procedure: AMPUTATION BELOW KNEE;  Surgeon: Newt Minion, MD;  Location: [place];  Service: Orthopedics;  Laterality: Left;  Left Below Knee Amputation and removal proximal screws IM tibial nail  . AMPUTATION Right 09/08/2013   Procedure: AMPUTATION BELOW KNEE;  Surgeon: Newt Minion, MD;  Location: [place];  Service: Orthopedics;  Laterality: Right;  Right Below Knee Amputation  . AMPUTATION Right 10/11/2013   Procedure: AMPUTATION BELOW KNEE;  Surgeon: Newt Minion, MD;  Location: [place];  Service: Orthopedics;  Laterality: Right;  Right Below Knee Amputation Revision  . AV FISTULA PLACEMENT  06/14/2012   Procedure: ARTERIOVENOUS (AV) FISTULA CREATION;  Surgeon: Angelia Mould, MD;  Location: [facility] OR;  Service: Vascular;  Laterality: Left;  Left basilic vein transposition with fistula.  . COLONOSCOPY N/A 10/28/2012   Procedure: COLONOSCOPY;  Surgeon: Jeryl Columbia, MD;  Location: [facility] ENDOSCOPY;  Service: Endoscopy;  Laterality: N/A;  . COLONOSCOPY N/A 11/02/2012   Procedure: COLONOSCOPY;  Surgeon: Cleotis Nipper, MD;  Location: [facility]COPY;  Service: Endoscopy;  Laterality: N/A;  . COLONOSCOPY N/A  11/03/2012   Procedure: COLONOSCOPY;  Surgeon: Cleotis Nipper, MD;  Location: [facility] ENDOSCOPY;  Service: Endoscopy;  Laterality: N/A;  . ENTEROSCOPY N/A 11/08/2012   Procedure: ENTEROSCOPY;  Surgeon: Wonda Horner, MD;  Location: [facility] ENDOSCOPY;  Service: Endoscopy;  Laterality: N/A;  . ESOPHAGOGASTRODUODENOSCOPY N/A 11/02/2012   Procedure: ESOPHAGOGASTRODUODENOSCOPY (EGD);  Surgeon: Cleotis Nipper, MD;  Location: [facility] ENDOSCOPY;  Service: Endoscopy;  Laterality: N/A;  . EYE SURGERY Left    to remove scar tissue  . GIVENS CAPSULE STUDY N/A 11/04/2012   Procedure: GIVENS CAPSULE STUDY;  Surgeon: Cleotis Nipper, MD;  Location: [facility] ENDOSCOPY;  Service: Endoscopy;  Laterality: N/A;  . HARDWARE REMOVAL Left 06/09/2013   Procedure: HARDWARE REMOVAL;  Surgeon: Newt Minion, MD;  Location: [place];  Service: Orthopedics;  Laterality: Left;  Left Below Knee Amputation  and Removal proximal screws IM tibial nail  . NEPHRECTOMY     partial RR  . ORIF FIBULA FRACTURE Left 09/09/2012   Procedure: OPEN REDUCTION INTERNAL FIXATION (ORIF) FIBULA FRACTURE;  Surgeon: Johnny Bridge, MD;  Location: [place];  Service: Orthopedics;  Laterality: Left;  . TIBIA IM NAIL INSERTION Left 09/09/2012   Procedure: INTRAMEDULLARY (IM) NAIL TIBIAL;  Surgeon: Johnny Bridge, MD;  Location: [place];  Service: Orthopedics;  Laterality: Left;  left tibial nail and open reduction internal fixation left fibula fracture    reports that he has never smoked. He has never used smokeless tobacco. He reports that he does not drink alcohol or use drugs. family history includes Coronary artery disease in his other; Diabetes in his mother; Heart attack in his father; Hypertension in his father and mother. No Known Allergies Current Outpatient Prescriptions on File Prior to Visit  Medication Sig Dispense Refill  . acetaminophen (TYLENOL) 500 MG tablet Take 500 mg by mouth every 6 (six) hours as needed for mild pain.    [name] atorvastatin (LIPITOR) 40 MG tablet Take 1 tablet (40 mg total) by mouth daily at 6 PM. 30 tablet 0  . calcium acetate (PHOSLO) 667 MG capsule Take 2,001 mg by mouth 3 (three) times daily with meals.    [name] diltiazem (CARDIZEM CD) 120 MG 24 hr capsule Take 1 capsule (120 mg total) by mouth daily. 90 capsule 3  . hydrOXYzine (ATARAX/VISTARIL) 25 MG tablet TAKE 1 TABLET(25 MG) BY MOUTH EVERY 6 HOURS AS NEEDED 40 tablet 0  . hydrOXYzine (VISTARIL) 25 MG capsule Take 25 mg by mouth 3 (three) times daily as needed for anxiety or itching.    [name] ketoconazole (NIZORAL) 2 % cream Apply 1 application topically daily. 30 g 1  . latanoprost  (XALATAN) 0.005 % ophthalmic solution place 1 drop into right eye at bedtime  0  . lidocaine-prilocaine (EMLA) cream APPLY A SMALL AMOUNT TO SKIN AT THE ACCESS SITE (AVF) AS DIRECTED BEFORE EACH DIALYSIS SESSION THREE DAYS A WEEK  6  . loperamide (IMODIUM) 2 MG capsule Take 1 capsule (2 mg total) by mouth as needed for diarrhea or loose stools. 30 capsule 0  . metoprolol succinate (TOPROL-XL) 100 MG 24 hr tablet Take 1 tablet (100 mg total) by mouth daily. Take with or immediately following a meal. 90 tablet 3  . PREVIDENT 5000 PLUS 1.1 % CREA dental cream Place 1 application onto teeth every morning.   6  . SENSIPAR 30 MG tablet Take 30 mg by mouth every evening.     . silver sulfADIAZINE (SILVADENE) 1 % cream Apply 1  application topically daily.    . Skin Protectants, Misc. (EUCERIN) cream Apply 1 application topically as needed for dry skin.    [name] VOLTAREN 1 % GEL APPLY 2 GRAMS TO HANDS BID TO TID  0  . warfarin (COUMADIN) 2.5 MG tablet Take 1 tablet (2.5 mg total) by mouth as directed. 40 tablet 3   No current facility-administered medications on file prior to visit.    Review of Systems  Constitutional: Negative for unusual diaphoresis or night sweats HENT: Negative for ear swelling or discharge Eyes: Negative for worsening visual haziness  Respiratory: Negative for choking and stridor.   Gastrointestinal: Negative for distension or worsening eructation Genitourinary: Negative for retention or change in urine volume.  Musculoskeletal: Negative for other MSK pain or swelling Skin: Negative for color change and worsening wound Neurological: Negative for tremors and numbness other than noted  Psychiatric/Behavioral: Negative for decreased concentration or agitation other than above       Objective:   Physical Exam BP 140/80   Pulse 80   Temp 98 F (36.7 C) (Oral)   Resp 20   Wt 230 lb (104.3 kg)   SpO2 96%   BMI 33.00 kg/m  VS noted, not ill appearing Constitutional: Pt appears  in no apparent distress HENT: Head: NCAT.  Right Ear: External ear normal.  Left Ear: External ear normal.  Eyes: . Pupils are equal, round, and reactive to light. Conjunctivae and EOM are normal Neck: Normal range of motion. Neck supple.  Cardiovascular: Normal rate and regular rhythm.   Pulmonary/Chest: Effort normal and breath sounds without rales or wheezing.  Abd:  Soft, NT, ND, + BS Neurological: Pt is alert. motor grossly intact Skin: Skin is warm. No rash, no LE edema Psychiatric: Pt behavior is normal. No agitation.      Assessment & Plan:

## 2016-04-03 NOTE — Progress Notes (Signed)
Pre visit review using our clinic review tool, if applicable. No additional management support is needed unless otherwise documented below in the visit note. 

## 2016-04-04 DIAGNOSIS — Z23 Encounter for immunization: Secondary | ICD-10-CM | POA: Diagnosis not present

## 2016-04-04 DIAGNOSIS — N2581 Secondary hyperparathyroidism of renal origin: Secondary | ICD-10-CM | POA: Diagnosis not present

## 2016-04-04 DIAGNOSIS — N186 End stage renal disease: Secondary | ICD-10-CM | POA: Diagnosis not present

## 2016-04-04 NOTE — Assessment & Plan Note (Signed)
stable overall by history and exam, recent data reviewed with pt, and pt to continue medical treatment as before,  to f/u any worsening symptoms or concerns Lab Results  Component Value Date   HGBA1C 6.2 12/11/2015

## 2016-04-04 NOTE — Assessment & Plan Note (Signed)
Recent onset, non toxic nonbloody without pain or other worsening GI symtpoms, has hx of cdiff but currently controlled or may be resolved since sept 5 with immodium prn since then, no abd pain or new complaints today, will cont to follow

## 2016-04-04 NOTE — Assessment & Plan Note (Signed)
stable overall by history and exam, recent data reviewed with pt, and pt to continue medical treatment as before,  to f/u any worsening symptoms or concerns BP Readings from Last 3 Encounters:  04/03/16 140/80  03/31/16 142/88  03/11/16 (!) 143/92

## 2016-04-06 ENCOUNTER — Telehealth: Payer: Self-pay | Admitting: Internal Medicine

## 2016-04-06 DIAGNOSIS — Z992 Dependence on renal dialysis: Secondary | ICD-10-CM | POA: Diagnosis not present

## 2016-04-06 DIAGNOSIS — T82868D Thrombosis of vascular prosthetic devices, implants and grafts, subsequent encounter: Secondary | ICD-10-CM | POA: Diagnosis not present

## 2016-04-06 DIAGNOSIS — N186 End stage renal disease: Secondary | ICD-10-CM | POA: Diagnosis not present

## 2016-04-06 DIAGNOSIS — I871 Compression of vein: Secondary | ICD-10-CM | POA: Diagnosis not present

## 2016-04-06 NOTE — Telephone Encounter (Signed)
Pt's spouse call request to speak to the assistant concern about Oscar Castillo health condition (alzheimer's). She is very concern but he doesn't want her to speak about it. Please call back on [phone] only.

## 2016-04-07 DIAGNOSIS — N2581 Secondary hyperparathyroidism of renal origin: Secondary | ICD-10-CM | POA: Diagnosis not present

## 2016-04-07 DIAGNOSIS — N186 End stage renal disease: Secondary | ICD-10-CM | POA: Diagnosis not present

## 2016-04-07 DIAGNOSIS — Z23 Encounter for immunization: Secondary | ICD-10-CM | POA: Diagnosis not present

## 2016-04-07 MED ORDER — QUETIAPINE FUMARATE 25 MG PO TABS
25.0000 mg | ORAL_TABLET | Freq: Two times a day (BID) | ORAL | 5 refills | Status: DC
Start: 2016-04-07 — End: 2017-01-04
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
## 2016-04-07 NOTE — Telephone Encounter (Signed)
Ok for seroquel 25 mg bid; this is a low dose, and may need increased if not working well

## 2016-04-08 NOTE — Telephone Encounter (Signed)
Left message for wife to give [place] a call back

## 2016-04-09 DIAGNOSIS — N186 End stage renal disease: Secondary | ICD-10-CM | POA: Diagnosis not present

## 2016-04-09 DIAGNOSIS — Z23 Encounter for immunization: Secondary | ICD-10-CM | POA: Diagnosis not present

## 2016-04-09 DIAGNOSIS — N2581 Secondary hyperparathyroidism of renal origin: Secondary | ICD-10-CM | POA: Diagnosis not present

## 2016-04-10 ENCOUNTER — Other Ambulatory Visit: Payer: Self-pay | Admitting: Internal Medicine

## 2016-04-11 DIAGNOSIS — Z23 Encounter for immunization: Secondary | ICD-10-CM | POA: Diagnosis not present

## 2016-04-11 DIAGNOSIS — N2581 Secondary hyperparathyroidism of renal origin: Secondary | ICD-10-CM | POA: Diagnosis not present

## 2016-04-11 DIAGNOSIS — N186 End stage renal disease: Secondary | ICD-10-CM | POA: Diagnosis not present

## 2016-04-14 DIAGNOSIS — N2581 Secondary hyperparathyroidism of renal origin: Secondary | ICD-10-CM | POA: Diagnosis not present

## 2016-04-14 DIAGNOSIS — Z23 Encounter for immunization: Secondary | ICD-10-CM | POA: Diagnosis not present

## 2016-04-14 DIAGNOSIS — N186 End stage renal disease: Secondary | ICD-10-CM | POA: Diagnosis not present

## 2016-04-15 DIAGNOSIS — N186 End stage renal disease: Secondary | ICD-10-CM | POA: Diagnosis not present

## 2016-04-15 DIAGNOSIS — Z992 Dependence on renal dialysis: Secondary | ICD-10-CM | POA: Diagnosis not present

## 2016-04-16 DIAGNOSIS — N186 End stage renal disease: Secondary | ICD-10-CM | POA: Diagnosis not present

## 2016-04-16 DIAGNOSIS — N2581 Secondary hyperparathyroidism of renal origin: Secondary | ICD-10-CM | POA: Diagnosis not present

## 2016-04-16 DIAGNOSIS — Z23 Encounter for immunization: Secondary | ICD-10-CM | POA: Diagnosis not present

## 2016-04-17 ENCOUNTER — Other Ambulatory Visit: Payer: Self-pay | Admitting: Internal Medicine

## 2016-04-18 DIAGNOSIS — Z23 Encounter for immunization: Secondary | ICD-10-CM | POA: Diagnosis not present

## 2016-04-18 DIAGNOSIS — N186 End stage renal disease: Secondary | ICD-10-CM | POA: Diagnosis not present

## 2016-04-18 DIAGNOSIS — N2581 Secondary hyperparathyroidism of renal origin: Secondary | ICD-10-CM | POA: Diagnosis not present

## 2016-04-20 ENCOUNTER — Telehealth: Payer: Self-pay | Admitting: *Deleted

## 2016-04-20 MED ORDER — LOPERAMIDE HCL 2 MG PO CAPS: 2.0000 mg | ORAL_CAPSULE | ORAL | 0 refills | Status: DC | PRN

## 2016-04-20 NOTE — Telephone Encounter (Signed)
Wife left msg on triage Friday @ 1:36 pm requesting refill for Loperamide 2mg . Sent refill to [facility]...Johny Chess

## 2016-04-20 NOTE — Telephone Encounter (Signed)
Notified wife refill was sent to [facility]...Johny Chess

## 2016-04-23 ENCOUNTER — Ambulatory Visit (INDEPENDENT_AMBULATORY_CARE_PROVIDER_SITE_OTHER): Payer: Medicare Other | Admitting: *Deleted

## 2016-04-23 DIAGNOSIS — I638 Other cerebral infarction: Secondary | ICD-10-CM

## 2016-04-23 DIAGNOSIS — N186 End stage renal disease: Secondary | ICD-10-CM | POA: Diagnosis not present

## 2016-04-23 DIAGNOSIS — Z5181 Encounter for therapeutic drug level monitoring: Secondary | ICD-10-CM | POA: Diagnosis not present

## 2016-04-23 DIAGNOSIS — I4891 Unspecified atrial fibrillation: Secondary | ICD-10-CM

## 2016-04-23 DIAGNOSIS — N2581 Secondary hyperparathyroidism of renal origin: Secondary | ICD-10-CM | POA: Diagnosis not present

## 2016-04-23 DIAGNOSIS — I6389 Other cerebral infarction: Secondary | ICD-10-CM

## 2016-04-23 DIAGNOSIS — Z23 Encounter for immunization: Secondary | ICD-10-CM | POA: Diagnosis not present

## 2016-04-23 LAB — POCT INR: INR: 2.7

## 2016-04-25 DIAGNOSIS — N186 End stage renal disease: Secondary | ICD-10-CM | POA: Diagnosis not present

## 2016-04-25 DIAGNOSIS — Z992 Dependence on renal dialysis: Secondary | ICD-10-CM | POA: Diagnosis not present

## 2016-04-25 DIAGNOSIS — N2581 Secondary hyperparathyroidism of renal origin: Secondary | ICD-10-CM | POA: Diagnosis not present

## 2016-04-25 DIAGNOSIS — E1122 Type 2 diabetes mellitus with diabetic chronic kidney disease: Secondary | ICD-10-CM | POA: Diagnosis not present

## 2016-04-25 DIAGNOSIS — Z23 Encounter for immunization: Secondary | ICD-10-CM | POA: Diagnosis not present

## 2016-04-28 DIAGNOSIS — N2581 Secondary hyperparathyroidism of renal origin: Secondary | ICD-10-CM | POA: Diagnosis not present

## 2016-04-28 DIAGNOSIS — N186 End stage renal disease: Secondary | ICD-10-CM | POA: Diagnosis not present

## 2016-04-28 DIAGNOSIS — D631 Anemia in chronic kidney disease: Secondary | ICD-10-CM | POA: Diagnosis not present

## 2016-04-29 DIAGNOSIS — N186 End stage renal disease: Secondary | ICD-10-CM | POA: Diagnosis not present

## 2016-04-29 DIAGNOSIS — Z992 Dependence on renal dialysis: Secondary | ICD-10-CM | POA: Diagnosis not present

## 2016-04-30 DIAGNOSIS — N186 End stage renal disease: Secondary | ICD-10-CM | POA: Diagnosis not present

## 2016-04-30 DIAGNOSIS — D631 Anemia in chronic kidney disease: Secondary | ICD-10-CM | POA: Diagnosis not present

## 2016-04-30 DIAGNOSIS — N2581 Secondary hyperparathyroidism of renal origin: Secondary | ICD-10-CM | POA: Diagnosis not present

## 2016-05-04 ENCOUNTER — Emergency Department (HOSPITAL_COMMUNITY): Payer: Medicare Other

## 2016-05-04 ENCOUNTER — Encounter (HOSPITAL_COMMUNITY): Payer: Self-pay

## 2016-05-04 ENCOUNTER — Emergency Department (HOSPITAL_COMMUNITY)
Admission: EM | Admit: 2016-05-04 | Discharge: 2016-05-04 | Disposition: A | Discharge status: Home / Self Care | Payer: Medicare Other | Attending: Emergency Medicine | Admitting: Emergency Medicine

## 2016-05-04 DIAGNOSIS — E1122 Type 2 diabetes mellitus with diabetic chronic kidney disease: Secondary | ICD-10-CM | POA: Insufficient documentation

## 2016-05-04 DIAGNOSIS — Z7901 Long term (current) use of anticoagulants: Secondary | ICD-10-CM | POA: Insufficient documentation

## 2016-05-04 DIAGNOSIS — I132 Hypertensive heart and chronic kidney disease with heart failure and with stage 5 chronic kidney disease, or end stage renal disease: Secondary | ICD-10-CM | POA: Insufficient documentation

## 2016-05-04 DIAGNOSIS — K5900 Constipation, unspecified: Secondary | ICD-10-CM | POA: Diagnosis not present

## 2016-05-04 DIAGNOSIS — Z992 Dependence on renal dialysis: Secondary | ICD-10-CM | POA: Insufficient documentation

## 2016-05-04 DIAGNOSIS — I5042 Chronic combined systolic (congestive) and diastolic (congestive) heart failure: Secondary | ICD-10-CM | POA: Diagnosis not present

## 2016-05-04 DIAGNOSIS — N186 End stage renal disease: Secondary | ICD-10-CM | POA: Insufficient documentation

## 2016-05-04 DIAGNOSIS — Z8673 Personal history of transient ischemic attack (TIA), and cerebral infarction without residual deficits: Secondary | ICD-10-CM | POA: Insufficient documentation

## 2016-05-04 DIAGNOSIS — K5909 Other constipation: Secondary | ICD-10-CM | POA: Diagnosis not present

## 2016-05-04 LAB — COMPREHENSIVE METABOLIC PANEL
ALBUMIN: 3.5 g/dL (ref 3.5–5.0)
ALT: 9 U/L — AB (ref 17–63)
AST: 9 U/L — AB (ref 15–41)
Alkaline Phosphatase: 36 U/L — ABNORMAL LOW (ref 38–126)
Anion gap: 16 — ABNORMAL HIGH (ref 5–15)
BILIRUBIN TOTAL: 0.4 mg/dL (ref 0.3–1.2)
BUN: 98 mg/dL — AB (ref 6–20)
CO2: 22 mmol/L (ref 22–32)
Calcium: 7.9 mg/dL — ABNORMAL LOW (ref 8.9–10.3)
Chloride: 98 mmol/L — ABNORMAL LOW (ref 101–111)
Creatinine, Ser: 14.53 mg/dL — ABNORMAL HIGH (ref 0.61–1.24)
GFR calc Af Amer: 4 mL/min — ABNORMAL LOW (ref >60)
GFR calc non Af Amer: 3 mL/min — ABNORMAL LOW (ref >60)
GLUCOSE: 115 mg/dL — AB (ref 65–99)
POTASSIUM: 4.6 mmol/L (ref 3.5–5.1)
Sodium: 136 mmol/L (ref 135–145)
TOTAL PROTEIN: 7.7 g/dL (ref 6.5–8.1)

## 2016-05-04 LAB — CBC
HEMATOCRIT: 30.6 % — AB (ref 39.0–52.0)
Hemoglobin: 9.9 g/dL — ABNORMAL LOW (ref 13.0–17.0)
MCH: 27.6 pg (ref 26.0–34.0)
MCHC: 32.4 g/dL (ref 30.0–36.0)
MCV: 85.2 fL (ref 78.0–100.0)
Platelets: 267 10*3/uL (ref 150–400)
RBC: 3.59 MIL/uL — ABNORMAL LOW (ref 4.22–5.81)
RDW: 15.4 % (ref 11.5–15.5)
WBC: 7.5 10*3/uL (ref 4.0–10.5)

## 2016-05-04 LAB — LIPASE, BLOOD: Lipase: 45 U/L (ref 11–51)

## 2016-05-04 MED ORDER — POLYETHYLENE GLYCOL 3350 17 G PO PACK: 17.0000 g | PACK | Freq: Two times a day (BID) | ORAL | 0 refills | Status: DC

## 2016-05-04 MED ORDER — FLEET ENEMA 7-19 GM/118ML RE ENEM
1.0000 | ENEMA | Freq: Once | RECTAL | Status: AC
  Administered 2016-05-04: 1 via RECTAL
  Filled 2016-05-04: qty 1

## 2016-05-04 NOTE — Discharge Instructions (Signed)
You were seen today for abdominal pain. While you are having some loose stools at home this is likely leaking around harder stool in the colon. You need to start MiraLAX 2-3 times a day. Discontinue Imodium. Follow-up with your primary physician in one to 2 days if symptoms are not improving.

## 2016-05-04 NOTE — ED Provider Notes (Signed)
[facility] DEPT Provider Note   CSN: [id number] Arrival date & time: 05/04/16  0043  By signing my name below, I, Oscar Castillo. Oscar Castillo, attest that this documentation has been prepared under the direction and in the presence of Oscar Hacker, MD.  Electronically Signed: [name]. Oscar Castillo, ED Scribe. 05/04/16. 2:19 AM.    History   Chief Complaint Chief Complaint  Patient presents with  . Constipation  . Abdominal Pain   The history is provided by the patient. No language interpreter was used.    HPI Comments: Oscar Castillo is a 65 y.o. male with a PMHx of CHF, DM, ESRD, HTN, and PAF who presents to the Emergency Department complaining of constant, worsening lower abdominal pain x 1 day. Currently pain is rated 4-5/10 which improved while waiting in triage from 9/10.  Patient reports that he feels like he is constipated but has had ongoing small loose stools. Pt also reports rectal pain. Stools are described as loose. Last bowel movement yesterday evening. No blood noted in stools. OTC Magnesium Citrate attempted at home without any imprvement. No recent fever, chills, nausea, or vomiting. Pt typically dialyzes on Tuesday, Thursday, and Saturdays. Last full successful treatment yesterday.  PCP: Oscar Cower, MD    Past Medical History:  Diagnosis Date  . Anemia   . Antral ulcer 2014   small  . BACK PAIN, LUMBAR, CHRONIC   . BENIGN PROSTATIC HYPERTROPHY   . CEREBROVASCULAR ACCIDENT, HX OF   . CHOLELITHIASIS   . Chronic combined systolic and diastolic CHF (congestive heart failure) ([place])   . Complication of anesthesia    wife states pt had trouble waking up with his last surgery in Nov., 2014  . DEPRESSION   . DIABETES MELLITUS, TYPE II   . ERECTILE DYSFUNCTION   . ESRD on hemodialysis ([place])    ESRD due to DM/HTN. Started dialysis in November 2013.  HD TTS at [facility] on [address].  [name] GERD   . Hemorrhoids   . HEPATITIS C, HX OF   . History of Clostridium difficile   .  HYPERTENSION   . LBBB (left bundle branch block)   . Morbid obesity ([place])   . NEPHROLITHIASIS, HX OF   . PAF (paroxysmal atrial fibrillation) ([place])    a. Dx 12/2015.  . S/P bilateral BKA (below knee amputation) [facility])     Patient Active Problem List   Diagnosis Date Noted  . New onset a-fib ([place]) 12/27/2015  . Glaucoma 12/27/2015  . LBBB (left bundle branch block)   . Rash 11/15/2015  . Cramping of hands 11/15/2015  . Liver fibrosis ([place]) 10/07/2015  . Diarrhea 08/14/2014  . Dehydration 10/02/2013  . Fever 10/02/2013  . Altered mental status 10/01/2013  . Encephalopathy, toxic 10/01/2013  . Encephalopathy, metabolic 99991111  . CVA (cerebral vascular accident) ([place]) 09/28/2013  . FTT (failure to thrive) in adult 09/28/2013  . Acute confusional state 09/28/2013  . Ulcer of sacral region, stage 3 ([place]) 09/26/2013  . S/P BKA (below knee amputation) bilateral ([place]) 09/08/2013  . ESRD on hemodialysis ([place]) 05/09/2013  . TIA (transient ischemic attack) 02/20/2013  . Acute blood loss anemia 10/28/2012  . Chronic combined systolic and diastolic congestive heart failure 10/28/2012  . Obstipation 10/28/2012  . GI bleed 10/27/2012  . Mass in rectum 10/27/2012  . DM (diabetes mellitus) type I controlled with renal manifestation ([place]) 09/08/2012  . LVH (left ventricular hypertrophy)-severe concentric 06/13/2012  . Anemia due to chronic illness  06/12/2012  . Hyperlipidemia 01/30/2011  . CHOLELITHIASIS 08/01/2010  . BENIGN PROSTATIC HYPERTROPHY 08/01/2010  . CEREBROVASCULAR ACCIDENT, HX OF 08/06/2009  . Depression 03/18/2009  . Acute combined systolic and diastolic heart failure, NYHA class 2-EF 45% 03/18/2009  . NEPHROLITHIASIS, HX OF 03/18/2009  . Morbid obesity ([place]) 03/25/2007  . Essential hypertension 03/25/2007  . GERD 03/25/2007  . Chronic hepatitis C without hepatic coma ([place]) 03/25/2007    Past Surgical History:  Procedure Laterality Date  . AMPUTATION Left 05/12/2013     Procedure: AMPUTATION RAY;  Surgeon: Newt Minion, MD;  Location: [facility];  Service: Orthopedics;  Laterality: Left;  Left Foot 1st Ray Amputation  . AMPUTATION Left 06/09/2013   Procedure: AMPUTATION BELOW KNEE;  Surgeon: Newt Minion, MD;  Location: [place];  Service: Orthopedics;  Laterality: Left;  Left Below Knee Amputation and removal proximal screws IM tibial nail  . AMPUTATION Right 09/08/2013   Procedure: AMPUTATION BELOW KNEE;  Surgeon: Newt Minion, MD;  Location: [place];  Service: Orthopedics;  Laterality: Right;  Right Below Knee Amputation  . AMPUTATION Right 10/11/2013   Procedure: AMPUTATION BELOW KNEE;  Surgeon: Newt Minion, MD;  Location: [place];  Service: Orthopedics;  Laterality: Right;  Right Below Knee Amputation Revision  . AV FISTULA PLACEMENT  06/14/2012   Procedure: ARTERIOVENOUS (AV) FISTULA CREATION;  Surgeon: Angelia Mould, MD;  Location: [facility] OR;  Service: Vascular;  Laterality: Left;  Left basilic vein transposition with fistula.  . COLONOSCOPY N/A 10/28/2012   Procedure: COLONOSCOPY;  Surgeon: Jeryl Columbia, MD;  Location: [facility] ENDOSCOPY;  Service: Endoscopy;  Laterality: N/A;  . COLONOSCOPY N/A 11/02/2012   Procedure: COLONOSCOPY;  Surgeon: Cleotis Nipper, MD;  [facility] ENDOSCOPY;  Service: Endoscopy;  Laterality: N/A;  . COLONOSCOPY N/A 11/03/2012   Procedure: COLONOSCOPY;  Surgeon: Cleotis Nipper, MD;  Location: [facility] ENDOSCOPY;  Service: Endoscopy;  Laterality: N/A;  . ENTEROSCOPY N/A 11/08/2012   Procedure: ENTEROSCOPY;  Surgeon: Wonda Horner, MD;  Location: [facility] ENDOSCOPY;  Service: Endoscopy;  Laterality: N/A;  . ESOPHAGOGASTRODUODENOSCOPY N/A 11/02/2012   Procedure: ESOPHAGOGASTRODUODENOSCOPY (EGD);  Surgeon: Cleotis Nipper, MD;  Location: [facility] ENDOSCOPY;  Service: Endoscopy;  Laterality: N/A;  . EYE SURGERY Left    to remove scar tissue  . GIVENS CAPSULE STUDY N/A 11/04/2012   Procedure: GIVENS CAPSULE STUDY;  Surgeon: Cleotis Nipper, MD;  Location: [facility]  ENDOSCOPY;  Service: Endoscopy;  Laterality: N/A;  . HARDWARE REMOVAL Left 06/09/2013   Procedure: HARDWARE REMOVAL;  Surgeon: Newt Minion, MD;  Location: [facility];  Service: Orthopedics;  Laterality: Left;  Left Below Knee Amputation  and Removal proximal screws IM tibial nail  . NEPHRECTOMY     partial RR  . ORIF FIBULA FRACTURE Left 09/09/2012   Procedure: OPEN REDUCTION INTERNAL FIXATION (ORIF) FIBULA FRACTURE;  Surgeon: Johnny Bridge, MD;  Location: [facility];  Service: Orthopedics;  Laterality: Left;  . TIBIA IM NAIL INSERTION Left 09/09/2012   Procedure: INTRAMEDULLARY (IM) NAIL TIBIAL;  Surgeon: Johnny Bridge, MD;  Location: [facility];  Service: Orthopedics;  Laterality: Left;  left tibial nail and open reduction internal fixation left fibula fracture       Home Medications    Prior to Admission medications   Medication Sig Start Date End Date Taking? Authorizing Provider  acetaminophen (TYLENOL) 500 MG tablet Take 500 mg by mouth every 6 (six) hours as needed for mild pain.   Yes Historical Provider,  MD  atorvastatin (LIPITOR) 40 MG tablet TAKE 1 TABLET(40 MG) BY MOUTH DAILY AT 6 PM 04/10/16  Yes [name], MD  calcium acetate (PHOSLO) 667 MG capsule Take 2,001 mg by mouth 3 (three) times daily with meals.   Yes Historical Provider, MD  diltiazem (CARDIZEM CD) 120 MG 24 hr capsule Take 1 capsule (120 mg total) by mouth daily. 01/09/16  Yes [name], MD  hydrOXYzine (ATARAX/VISTARIL) 25 MG tablet TAKE 1 TABLET(25 MG) BY MOUTH EVERY 6 HOURS AS NEEDED 04/17/16  Yes [name], MD  ketoconazole (NIZORAL) 2 % cream Apply 1 application topically daily. 11/13/15  Yes [name], MD  latanoprost (XALATAN) 0.005 % ophthalmic solution place 1 drop into right eye at bedtime 09/13/15  Yes Historical Provider, MD  lidocaine-prilocaine (EMLA) cream APPLY A SMALL AMOUNT TO SKIN AT THE ACCESS SITE (AVF) AS DIRECTED BEFORE EACH DIALYSIS SESSION THREE DAYS A WEEK 06/03/15  Yes Historical Provider, MD    metoprolol succinate (TOPROL-XL) 100 MG 24 hr tablet Take 1 tablet (100 mg total) by mouth daily. Take with or immediately following a meal. 01/09/16  Yes [name], MD  PREVIDENT 5000 PLUS 1.1 % CREA dental cream Place 1 application onto teeth every morning.  04/24/15  Yes Historical Provider, MD  QUEtiapine (SEROQUEL) 25 MG tablet Take 1 tablet (25 mg total) by mouth 2 (two) times daily. 04/07/16  Yes [name], MD  SENSIPAR 30 MG tablet Take 30 mg by mouth every evening.  01/09/15  Yes Historical Provider, MD  silver sulfADIAZINE (SILVADENE) 1 % cream Apply 1 application topically daily.   Yes Historical Provider, MD  VOLTAREN 1 % GEL APPLY 2 GRAMS TO HANDS BID TO TID 09/19/15  Yes Historical Provider, MD  warfarin (COUMADIN) 1 MG tablet Take 1-1.5 mg by mouth See admin instructions. Take 1 and 1/2 tablets on Thursday, Friday and Saturday then take 1 tablet all the other days   Yes Historical Provider, MD  polyethylene glycol (MIRALAX / GLYCOLAX) packet Take 17 g by mouth 2 (two) times daily. 05/04/16   Oscar Hacker, MD  warfarin (COUMADIN) 2.5 MG tablet Take 1 tablet (2.5 mg total) by mouth as directed. Patient not taking: Reported on 05/04/2016 01/23/16   Fay Records, MD    Family History Family History  Problem Relation Age of Onset  . Diabetes Mother   . Hypertension Mother   . Heart attack Father   . Hypertension Father   . Coronary artery disease Other     Social History Social History  Substance Use Topics  . Smoking status: Never Smoker  . Smokeless tobacco: Never Used  . Alcohol use No     Allergies   Review of patient's allergies indicates no known allergies.   Review of Systems Review of Systems  Constitutional: Negative for chills and fever.  Respiratory: Negative for shortness of breath.   Cardiovascular: Negative for chest pain.  Gastrointestinal: Positive for abdominal pain, diarrhea and rectal pain. Negative for nausea and vomiting.  Neurological:  Negative for headaches.  Psychiatric/Behavioral: Negative for confusion.  All other systems reviewed and are negative.    Physical Exam Updated Vital Signs BP 129/80 (BP Location: Left Arm)   Pulse 72   Temp 99.1 F (37.3 C) (Oral)   Resp 18   SpO2 100%   Physical Exam  Constitutional:  Chronically ill-appearing  HENT:  Head: Normocephalic and atraumatic.  Neck: Neck supple.  Cardiovascular: Normal rate  and regular rhythm.   No murmur heard. Pulmonary/Chest: Effort normal and breath sounds normal. No respiratory distress. He has no wheezes.  Abdominal: Soft. Bowel sounds are normal. He exhibits no distension. There is tenderness. There is no guarding.  Genitourinary:  Genitourinary Comments: Stool incontinence noted, no gross blood, hard stool noted in rectal vault  Musculoskeletal:  Bilateral BKA's Fistal LUE  Nursing note and vitals reviewed.    ED Treatments / Results   DIAGNOSTIC STUDIES: Oxygen Saturation is 100% on RA, Normal by my interpretation.    COORDINATION OF CARE: 2:18 AM- Will order blood work and imaging. Discussed treatment plan with pt at bedside and pt agreed to plan.     Labs (all labs ordered are listed, but only abnormal results are displayed) Labs Reviewed  COMPREHENSIVE METABOLIC PANEL - Abnormal; Notable for the following:       Result Value   Chloride 98 (*)    Glucose, Bld 115 (*)    BUN 98 (*)    Creatinine, Ser 14.53 (*)    Calcium 7.9 (*)    AST 9 (*)    ALT 9 (*)    Alkaline Phosphatase 36 (*)    GFR calc non Af Amer 3 (*)    GFR calc Af Amer 4 (*)    Anion gap 16 (*)    All other components within normal limits  CBC - Abnormal; Notable for the following:    RBC 3.59 (*)    Hemoglobin 9.9 (*)    HCT 30.6 (*)    All other components within normal limits  LIPASE, BLOOD    EKG  EKG Interpretation None       Radiology Dg Abdomen Acute W/chest  Result Date: 05/04/2016 CLINICAL DATA:  Left lower quadrant pain.   Constipation. EXAM: DG ABDOMEN ACUTE W/ 1V CHEST COMPARISON:  Chest radiograph 12/26/2015 FINDINGS: Cardiomediastinal contours are normal. No pneumothorax or pleural effusion. No focal airspace consolidation or pulmonary edema. Mild right basilar atelectasis. No free intraperitoneal air. There is a moderate volume of retained colonic stool. No dilated loops of small bowel are identified. IMPRESSION: 1. Clear lungs. 2. No free intraperitoneal air or evidence of small-bowel obstruction. 3. Moderate to large amount of retained colonic stool. Electronically Signed   By: Ulyses Jarred M.D.   On: 05/04/2016 03:20    Procedures Procedures (including critical care time)  Medications Ordered in ED Medications  sodium phosphate (FLEET) 7-19 GM/118ML enema 1 enema (1 enema Rectal Given 05/04/16 0305)     Initial Impression / Assessment and Plan / ED Course  I have reviewed the triage vital signs and the nursing notes.  Pertinent labs & imaging results that were available during my care of the patient were reviewed by me and considered in my medical decision making (see chart for details).  Clinical Course    Patient resistance with abdominal pain concern for constipation. However he does report loose stools at home and is currently taking Imodium but took mag citrate earlier today. Nontoxic. Abdominal exam is benign. He does have some hard stool just out of reach in the rectal vault. Suspect he is leaking around colonic stool. He was given an enema. X-rays notable for moderate to large amount retained colonic stool. He did have some relief with an enema. Patient would like to go home. Discussed with patient initiating MiraLAX 2-3 times a day for bowel cleanout and discontinuing Imodium. Follow-up with primary physician if symptoms do not improve.  After history,  exam, and medical workup I feel the patient has been appropriately medically screened and is safe for discharge home. Pertinent diagnoses were  discussed with the patient. Patient was given return precautions.   Final Clinical Impressions(s) / ED Diagnoses   Final diagnoses:  Other constipation    New Prescriptions New Prescriptions   POLYETHYLENE GLYCOL (MIRALAX / GLYCOLAX) PACKET    Take 17 g by mouth 2 (two) times daily.   I personally performed the services described in this documentation, which was scribed in my presence. The recorded information has been reviewed and is accurate.    Oscar Hacker, MD 05/04/16 [phone]

## 2016-05-04 NOTE — ED Notes (Signed)
Watery bowel movenment

## 2016-05-04 NOTE — ED Triage Notes (Addendum)
Pt states that he has been having constipation and rectal pain that started today. Last BM was today but it was small, denies blood in stool. Pt also states that he is having lower abd cramping.

## 2016-05-04 NOTE — ED Notes (Signed)
Patient transported to X-ray 

## 2016-05-05 DIAGNOSIS — N186 End stage renal disease: Secondary | ICD-10-CM | POA: Diagnosis not present

## 2016-05-05 DIAGNOSIS — N2581 Secondary hyperparathyroidism of renal origin: Secondary | ICD-10-CM | POA: Diagnosis not present

## 2016-05-05 DIAGNOSIS — D631 Anemia in chronic kidney disease: Secondary | ICD-10-CM | POA: Diagnosis not present

## 2016-05-07 DIAGNOSIS — D631 Anemia in chronic kidney disease: Secondary | ICD-10-CM | POA: Diagnosis not present

## 2016-05-07 DIAGNOSIS — N186 End stage renal disease: Secondary | ICD-10-CM | POA: Diagnosis not present

## 2016-05-07 DIAGNOSIS — N2581 Secondary hyperparathyroidism of renal origin: Secondary | ICD-10-CM | POA: Diagnosis not present

## 2016-05-09 DIAGNOSIS — N186 End stage renal disease: Secondary | ICD-10-CM | POA: Diagnosis not present

## 2016-05-09 DIAGNOSIS — N2581 Secondary hyperparathyroidism of renal origin: Secondary | ICD-10-CM | POA: Diagnosis not present

## 2016-05-09 DIAGNOSIS — D631 Anemia in chronic kidney disease: Secondary | ICD-10-CM | POA: Diagnosis not present

## 2016-05-12 DIAGNOSIS — D631 Anemia in chronic kidney disease: Secondary | ICD-10-CM | POA: Diagnosis not present

## 2016-05-12 DIAGNOSIS — N186 End stage renal disease: Secondary | ICD-10-CM | POA: Diagnosis not present

## 2016-05-12 DIAGNOSIS — N2581 Secondary hyperparathyroidism of renal origin: Secondary | ICD-10-CM | POA: Diagnosis not present

## 2016-05-14 ENCOUNTER — Ambulatory Visit (INDEPENDENT_AMBULATORY_CARE_PROVIDER_SITE_OTHER): Payer: Medicare Other | Admitting: Internal Medicine

## 2016-05-14 ENCOUNTER — Encounter: Payer: Self-pay | Admitting: Internal Medicine

## 2016-05-14 VITALS — BP 118/68 | HR 78 | Ht 70.0 in | Wt 225.0 lb

## 2016-05-14 DIAGNOSIS — I5041 Acute combined systolic (congestive) and diastolic (congestive) heart failure: Secondary | ICD-10-CM | POA: Diagnosis not present

## 2016-05-14 DIAGNOSIS — I517 Cardiomegaly: Secondary | ICD-10-CM

## 2016-05-14 DIAGNOSIS — I639 Cerebral infarction, unspecified: Secondary | ICD-10-CM | POA: Diagnosis not present

## 2016-05-14 DIAGNOSIS — N186 End stage renal disease: Secondary | ICD-10-CM | POA: Diagnosis not present

## 2016-05-14 DIAGNOSIS — I447 Left bundle-branch block, unspecified: Secondary | ICD-10-CM

## 2016-05-14 DIAGNOSIS — D631 Anemia in chronic kidney disease: Secondary | ICD-10-CM | POA: Diagnosis not present

## 2016-05-14 DIAGNOSIS — N2581 Secondary hyperparathyroidism of renal origin: Secondary | ICD-10-CM | POA: Diagnosis not present

## 2016-05-14 DIAGNOSIS — I480 Paroxysmal atrial fibrillation: Secondary | ICD-10-CM | POA: Diagnosis not present

## 2016-05-14 LAB — LIPID PANEL
CHOL/HDL RATIO: 2.8 ratio (ref <=5.0)
CHOLESTEROL: 102 mg/dL — AB (ref 125–200)
HDL: 36 mg/dL — AB (ref >=40)
LDL Cholesterol: 54 mg/dL (ref <130)
Triglycerides: 61 mg/dL (ref <150)
VLDL: 12 mg/dL (ref <30)

## 2016-05-14 NOTE — Patient Instructions (Signed)
Medication Instructions:  Your physician recommends that you continue on your current medications as directed. Please refer to the Current Medication list given to you today.   Labwork: Lipid profile today  Testing/Procedures: None   Follow-Up: Your physician wants you to follow-up in: April 2018 with Dr Harrington Challenger.  You will receive a reminder letter in the mail two months in advance. If you don't receive a letter, please call our office to schedule the follow-up appointment.        If you need a refill on your cardiac medications before your next appointment, please call your pharmacy.

## 2016-05-14 NOTE — Progress Notes (Signed)
Cardiology Office Note   Date:  05/14/2016   ID:  Oscar Castillo, DOB 11/19/1950, MRN [id number]  PCP:  [name], MD  Cardiologist:   Dorris Carnes, MD    F/U of PAF    History of Present Illness:ESRD on HD, chronic combined CHF, CVA, DM, HTN, hepatitis C, and recently diagnosed atrial fib RVR/LBBB  Pt  admitted with fatigue 12/31/15 and was found to be in AF RVR, new LBBB, mild vascular congestion on CXR, and mildly elevated troponin. He was placed on Cardizem and converted to NSR. He underwent nuclear stress 12/30/15 with fixed defects but no significant ischemia, findings were felt likely explained by LBBB. EF was 36% by nuc but f/u echo 12/27/15 with mild LVH, EF 50-55%, no rWMA, severe AV thickening without AS/AI, mild MR. He was started on Coumadin. Atorvastatin was increased. He also had transient AMS/strokelike sx on 6/2 and MR/CT were nonacute, no formal dx was given by neurology.    Pt just had dialysis today   He denies CP  Breathing is OK  No dizziness  No palpitations  Sleeping OK    Outpatient Medications Prior to Visit  Medication Sig Dispense Refill  . acetaminophen (TYLENOL) 500 MG tablet Take 500 mg by mouth every 6 (six) hours as needed for mild pain.    [name] atorvastatin (LIPITOR) 40 MG tablet TAKE 1 TABLET(40 MG) BY MOUTH DAILY AT 6 PM 30 tablet 0  . calcium acetate (PHOSLO) 667 MG capsule Take 2,001 mg by mouth 3 (three) times daily with meals.    [name] diltiazem (CARDIZEM CD) 120 MG 24 hr capsule Take 1 capsule (120 mg total) by mouth daily. 90 capsule 3  . hydrOXYzine (ATARAX/VISTARIL) 25 MG tablet TAKE 1 TABLET(25 MG) BY MOUTH EVERY 6 HOURS AS NEEDED 40 tablet 0  . ketoconazole (NIZORAL) 2 % cream Apply 1 application topically daily. 30 g 1  . lidocaine-prilocaine (EMLA) cream APPLY A SMALL AMOUNT TO SKIN AT THE ACCESS SITE (AVF) AS DIRECTED BEFORE EACH DIALYSIS SESSION THREE DAYS A WEEK  6  . metoprolol succinate (TOPROL-XL) 100 MG 24 hr tablet Take 1 tablet (100 mg  total) by mouth daily. Take with or immediately following a meal. 90 tablet 3  . polyethylene glycol (MIRALAX / GLYCOLAX) packet Take 17 g by mouth 2 (two) times daily. 14 each 0  . PREVIDENT 5000 PLUS 1.1 % CREA dental cream Place 1 application onto teeth every morning.   6  . QUEtiapine (SEROQUEL) 25 MG tablet Take 1 tablet (25 mg total) by mouth 2 (two) times daily. 60 tablet 5  . SENSIPAR 30 MG tablet Take 30 mg by mouth every evening.     . silver sulfADIAZINE (SILVADENE) 1 % cream Apply 1 application topically daily.    . VOLTAREN 1 % GEL APPLY 2 GRAMS TO HANDS BID TO TID  0  . warfarin (COUMADIN) 1 MG tablet Take 1-1.5 mg by mouth See admin instructions. Take 1 and 1/2 tablets on Thursday, Friday and Saturday then take 1 tablet all the other days    . warfarin (COUMADIN) 2.5 MG tablet Take 1 tablet (2.5 mg total) by mouth as directed. 40 tablet 3  . latanoprost (XALATAN) 0.005 % ophthalmic solution place 1 drop into right eye at bedtime  0   No facility-administered medications prior to visit.      Allergies:   Review of patient's allergies indicates no known allergies.   Past Medical History:  Diagnosis Date  .  Anemia   . Antral ulcer 2014   small  . BACK PAIN, LUMBAR, CHRONIC   . BENIGN PROSTATIC HYPERTROPHY   . CEREBROVASCULAR ACCIDENT, HX OF   . CHOLELITHIASIS   . Chronic combined systolic and diastolic CHF (congestive heart failure) ([place])   . Complication of anesthesia    wife states pt had trouble waking up with his last surgery in Nov., 2014  . DEPRESSION   . DIABETES MELLITUS, TYPE II   . ERECTILE DYSFUNCTION   . ESRD on hemodialysis ([place])    ESRD due to DM/HTN. Started dialysis in November 2013.  HD TTS at [facility] on [address].  [name] GERD   . Hemorrhoids   . HEPATITIS C, HX OF   . History of Clostridium difficile   . HYPERTENSION   . LBBB (left bundle branch block)   . Morbid obesity ([place])   . NEPHROLITHIASIS, HX OF   . PAF (paroxysmal atrial fibrillation)  ([place])    a. Dx 12/2015.  . S/P bilateral BKA (below knee amputation) [facility])     Past Surgical History:  Procedure Laterality Date  . AMPUTATION Left 05/12/2013   Procedure: AMPUTATION RAY;  Surgeon: Newt Minion, MD;  Location: [place];  Service: Orthopedics;  Laterality: Left;  Left Foot 1st Ray Amputation  . AMPUTATION Left 06/09/2013   Procedure: AMPUTATION BELOW KNEE;  Surgeon: Newt Minion, MD;  Location: [place];  Service: Orthopedics;  Laterality: Left;  Left Below Knee Amputation and removal proximal screws IM tibial nail  . AMPUTATION Right 09/08/2013   Procedure: AMPUTATION BELOW KNEE;  Surgeon: Newt Minion, MD;  Location: [place];  Service: Orthopedics;  Laterality: Right;  Right Below Knee Amputation  . AMPUTATION Right 10/11/2013   Procedure: AMPUTATION BELOW KNEE;  Surgeon: Newt Minion, MD;  Location: [place];  Service: Orthopedics;  Laterality: Right;  Right Below Knee Amputation Revision  . AV FISTULA PLACEMENT  06/14/2012   Procedure: ARTERIOVENOUS (AV) FISTULA CREATION;  Surgeon: Angelia Mould, MD;  [facility] OR;  Service: Vascular;  Laterality: Left;  Left basilic vein transposition with fistula.  . COLONOSCOPY N/A 10/28/2012   Procedure: COLONOSCOPY;  Surgeon: Jeryl Columbia, MD;  Location: [facility] ENDOSCOPY;  Service: Endoscopy;  Laterality: N/A;  . COLONOSCOPY N/A 11/02/2012   Procedure: COLONOSCOPY;  Surgeon: Cleotis Nipper, MD;  Location: [facility] ENDOSCOPY;  Service: Endoscopy;  Laterality: N/A;  . COLONOSCOPY N/A 11/03/2012   Procedure: COLONOSCOPY;  Surgeon: Cleotis Nipper, MD;  Location: [facility] ENDOSCOPY;  Service: Endoscopy;  Laterality: N/A;  . ENTEROSCOPY N/A 11/08/2012   Procedure: ENTEROSCOPY;  Surgeon: Wonda Horner, MD;  Location: [facility] ENDOSCOPY;  Service: Endoscopy;  Laterality: N/A;  . ESOPHAGOGASTRODUODENOSCOPY N/A 11/02/2012   Procedure: ESOPHAGOGASTRODUODENOSCOPY (EGD);  Surgeon: Cleotis Nipper, MD;  Location: [facility] ENDOSCOPY;  Service: Endoscopy;  Laterality: N/A;  .  EYE SURGERY Left    to remove scar tissue  . GIVENS CAPSULE STUDY N/A 11/04/2012   Procedure: GIVENS CAPSULE STUDY;  Surgeon: Cleotis Nipper, MD;  Location: [facility] ENDOSCOPY;  Service: Endoscopy;  Laterality: N/A;  . HARDWARE REMOVAL Left 06/09/2013   Procedure: HARDWARE REMOVAL;  Surgeon: Newt Minion, MD;  Location: [place];  Service: Orthopedics;  Laterality: Left;  Left Below Knee Amputation  and Removal proximal screws IM tibial nail  . NEPHRECTOMY     partial RR  . ORIF FIBULA FRACTURE Left 09/09/2012   Procedure: OPEN REDUCTION INTERNAL FIXATION (ORIF) FIBULA FRACTURE;  Surgeon: Johnny Bridge, MD;  Location: [place];  Service: Orthopedics;  Laterality: Left;  . TIBIA IM NAIL INSERTION Left 09/09/2012   Procedure: INTRAMEDULLARY (IM) NAIL TIBIAL;  Surgeon: Johnny Bridge, MD;  Location: [place];  Service: Orthopedics;  Laterality: Left;  left tibial nail and open reduction internal fixation left fibula fracture     Social History:  The patient  reports that he has never smoked. He has never used smokeless tobacco. He reports that he does not drink alcohol or use drugs.   Family History:  The patient's family history includes Coronary artery disease in his other; Diabetes in his mother; Heart attack in his father; Hypertension in his father and mother.    ROS:  Please see the history of present illness. All other systems are reviewed and  Negative to the above problem except as noted.    PHYSICAL EXAM: VS:  BP 118/68   Pulse 78   Ht 5\' 10"  (1.778 m)   Wt 225 lb (102.1 kg)   SpO2 99%   BMI 32.28 kg/m   GEN: Well nourished, well developed, in no acute distress  HEENT: normal  Neck: no JVD, carotid bruits, or masses Cardiac: RRR; no murmurs, rubs, or gallops,no edema  Respiratory: Rales at bases GI: soft, nontender, nondistended, + BS  No hepatomegaly  MS: no deformity s/p BKA  Bilaterally   Skin: warm and dry, no rash Neuro:  Strength and sensation are intact Psych: euthymic mood,  full affect   EKG:  EKG is ordered today.  SR 75 bpm  First degree AV blocke  LVH with repolarization abnormalitiy     Lipid Panel    Component Value Date/Time   CHOL 158 12/11/2015 1447   TRIG 90.0 12/11/2015 1447   HDL 29.70 (L) 12/11/2015 1447   CHOLHDL 5 12/11/2015 1447   VLDL 18.0 12/11/2015 1447   LDLCALC 111 (H) 12/11/2015 1447      Wt Readings from Last 3 Encounters:  05/14/16 225 lb (102.1 kg)  04/03/16 230 lb (104.3 kg)  03/31/16 230 lb (104.3 kg)      ASSESSMENT AND PLAN:  1.  PAF Appars to be in SR today  CHADSVASc of 6  Keep on coumadin  2  LBBB  No ischemi on myovue    3  Chronic systolic /diastolic CHF  Volume managed through HD  4  HTN  BP OK   4  Hx CVA  5  HL  Will check labs  On lipitor    Current medicines are reviewed at length with the patient today.  The patient does not have concerns regarding medicines.  Signed, Dorris Carnes, MD  05/14/2016 4:21 PM    [facility] [address], [place], [place]  [address] Phone: [phone]; Fax: [phone]

## 2016-05-16 DIAGNOSIS — N2581 Secondary hyperparathyroidism of renal origin: Secondary | ICD-10-CM | POA: Diagnosis not present

## 2016-05-16 DIAGNOSIS — D631 Anemia in chronic kidney disease: Secondary | ICD-10-CM | POA: Diagnosis not present

## 2016-05-16 DIAGNOSIS — N186 End stage renal disease: Secondary | ICD-10-CM | POA: Diagnosis not present

## 2016-05-19 DIAGNOSIS — N186 End stage renal disease: Secondary | ICD-10-CM | POA: Diagnosis not present

## 2016-05-19 DIAGNOSIS — D631 Anemia in chronic kidney disease: Secondary | ICD-10-CM | POA: Diagnosis not present

## 2016-05-19 DIAGNOSIS — N2581 Secondary hyperparathyroidism of renal origin: Secondary | ICD-10-CM | POA: Diagnosis not present

## 2016-05-20 ENCOUNTER — Other Ambulatory Visit: Payer: Self-pay | Admitting: Internal Medicine

## 2016-05-21 DIAGNOSIS — E1122 Type 2 diabetes mellitus with diabetic chronic kidney disease: Secondary | ICD-10-CM | POA: Diagnosis not present

## 2016-05-21 DIAGNOSIS — N186 End stage renal disease: Secondary | ICD-10-CM | POA: Diagnosis not present

## 2016-05-21 DIAGNOSIS — N2581 Secondary hyperparathyroidism of renal origin: Secondary | ICD-10-CM | POA: Diagnosis not present

## 2016-05-21 DIAGNOSIS — Z5181 Encounter for therapeutic drug level monitoring: Secondary | ICD-10-CM | POA: Diagnosis not present

## 2016-05-21 DIAGNOSIS — I4891 Unspecified atrial fibrillation: Secondary | ICD-10-CM | POA: Diagnosis not present

## 2016-05-21 DIAGNOSIS — D631 Anemia in chronic kidney disease: Secondary | ICD-10-CM | POA: Diagnosis not present

## 2016-05-23 DIAGNOSIS — N2581 Secondary hyperparathyroidism of renal origin: Secondary | ICD-10-CM | POA: Diagnosis not present

## 2016-05-23 DIAGNOSIS — D631 Anemia in chronic kidney disease: Secondary | ICD-10-CM | POA: Diagnosis not present

## 2016-05-23 DIAGNOSIS — N186 End stage renal disease: Secondary | ICD-10-CM | POA: Diagnosis not present

## 2016-05-26 DIAGNOSIS — N186 End stage renal disease: Secondary | ICD-10-CM | POA: Diagnosis not present

## 2016-05-26 DIAGNOSIS — E1122 Type 2 diabetes mellitus with diabetic chronic kidney disease: Secondary | ICD-10-CM | POA: Diagnosis not present

## 2016-05-26 DIAGNOSIS — N2581 Secondary hyperparathyroidism of renal origin: Secondary | ICD-10-CM | POA: Diagnosis not present

## 2016-05-26 DIAGNOSIS — Z992 Dependence on renal dialysis: Secondary | ICD-10-CM | POA: Diagnosis not present

## 2016-05-26 DIAGNOSIS — D631 Anemia in chronic kidney disease: Secondary | ICD-10-CM | POA: Diagnosis not present

## 2016-05-28 DIAGNOSIS — N186 End stage renal disease: Secondary | ICD-10-CM | POA: Diagnosis not present

## 2016-05-28 DIAGNOSIS — E1122 Type 2 diabetes mellitus with diabetic chronic kidney disease: Secondary | ICD-10-CM | POA: Diagnosis not present

## 2016-05-28 DIAGNOSIS — N2581 Secondary hyperparathyroidism of renal origin: Secondary | ICD-10-CM | POA: Diagnosis not present

## 2016-05-30 ENCOUNTER — Other Ambulatory Visit: Payer: Self-pay | Admitting: Internal Medicine

## 2016-05-30 DIAGNOSIS — N186 End stage renal disease: Secondary | ICD-10-CM | POA: Diagnosis not present

## 2016-05-30 DIAGNOSIS — E1122 Type 2 diabetes mellitus with diabetic chronic kidney disease: Secondary | ICD-10-CM | POA: Diagnosis not present

## 2016-05-30 DIAGNOSIS — N2581 Secondary hyperparathyroidism of renal origin: Secondary | ICD-10-CM | POA: Diagnosis not present

## 2016-06-01 ENCOUNTER — Encounter: Payer: Self-pay | Admitting: Internal Medicine

## 2016-06-01 ENCOUNTER — Ambulatory Visit (INDEPENDENT_AMBULATORY_CARE_PROVIDER_SITE_OTHER): Payer: Medicare Other | Admitting: Internal Medicine

## 2016-06-01 VITALS — BP 130/70 | HR 90 | Temp 98.4°F | Resp 20 | Wt 225.0 lb

## 2016-06-01 DIAGNOSIS — I639 Cerebral infarction, unspecified: Secondary | ICD-10-CM | POA: Diagnosis not present

## 2016-06-01 DIAGNOSIS — K5900 Constipation, unspecified: Secondary | ICD-10-CM

## 2016-06-01 DIAGNOSIS — I10 Essential (primary) hypertension: Secondary | ICD-10-CM

## 2016-06-01 MED ORDER — TRAMADOL HCL 50 MG PO TABS: 50.0000 mg | ORAL_TABLET | Freq: Three times a day (TID) | ORAL | 1 refills | Status: DC | PRN

## 2016-06-01 NOTE — Patient Instructions (Signed)
Please restart the Miralax (a new prescription is sent if needed)  Please take all new medication as prescribed - the pain medication  Please continue all other medications as before, and refills have been done if requested.  Please have the pharmacy call with any other refills you may need.  Please keep your appointments with your specialists as you may have planned  OK to cancel the Nov 15 appt, and re-schedule for 6 mo

## 2016-06-01 NOTE — Progress Notes (Signed)
Subjective:    Patient ID: Oscar Castillo, male    DOB: 1951/06/24, 65 y.o.   MRN: [id number]  HPI Oscar Castillo is a 65 y.o. male with a PMHx of CHF, DM, ESRD, HTN, and PAF , here with worsening rectal discomfort/lower abd pain, mild, intemrittent, more freq in the past 1 wk since unable to find his miralax after a move of residence 1 wk ago.  Denies worsening reflux, other abd pain, dysphagia, n/v, or blood. Pt typically dialyzes on Tuesday, Thursday, and Saturdays. O/w c/o arthritic pain not well controlled with tylenol.  Past Medical History:  Diagnosis Date  . Anemia   . Antral ulcer 2014   small  . BACK PAIN, LUMBAR, CHRONIC   . BENIGN PROSTATIC HYPERTROPHY   . CEREBROVASCULAR ACCIDENT, HX OF   . CHOLELITHIASIS   . Chronic combined systolic and diastolic CHF (congestive heart failure) ([place])   . Complication of anesthesia    wife states pt had trouble waking up with his last surgery in Nov., 2014  . DEPRESSION   . DIABETES MELLITUS, TYPE II   . ERECTILE DYSFUNCTION   . ESRD on hemodialysis ([place])    ESRD due to DM/HTN. Started dialysis in November 2013.  HD TTS at [facility] on [address].  [name] GERD   . Hemorrhoids   . HEPATITIS C, HX OF   . History of Clostridium difficile   . HYPERTENSION   . LBBB (left bundle branch block)   . Morbid obesity ([place])   . NEPHROLITHIASIS, HX OF   . PAF (paroxysmal atrial fibrillation) ([place])    a. Dx 12/2015.  . S/P bilateral BKA (below knee amputation) [facility])    Past Surgical History:  Procedure Laterality Date  . AMPUTATION Left 05/12/2013   Procedure: AMPUTATION RAY;  Surgeon: Newt Minion, MD;  Location: [place];  Service: Orthopedics;  Laterality: Left;  Left Foot 1st Ray Amputation  . AMPUTATION Left 06/09/2013   Procedure: AMPUTATION BELOW KNEE;  Surgeon: Newt Minion, MD;  Location: [place];  Service: Orthopedics;  Laterality: Left;  Left Below Knee Amputation and removal proximal screws IM tibial nail  . AMPUTATION Right 09/08/2013   Procedure: AMPUTATION BELOW KNEE;  Surgeon: Newt Minion, MD;  Location: [place];  Service: Orthopedics;  Laterality: Right;  Right Below Knee Amputation  . AMPUTATION Right 10/11/2013   Procedure: AMPUTATION BELOW KNEE;  Surgeon: Newt Minion, MD;  Location: [place];  Service: Orthopedics;  Laterality: Right;  Right Below Knee Amputation Revision  . AV FISTULA PLACEMENT  06/14/2012   Procedure: ARTERIOVENOUS (AV) FISTULA CREATION;  Surgeon: Angelia Mould, MD;  Location: [facility] OR;  Service: Vascular;  Laterality: Left;  Left basilic vein transposition with fistula.  . COLONOSCOPY N/A 10/28/2012   Procedure: COLONOSCOPY;  Surgeon: Jeryl Columbia, MD;  Location: [facility] ENDOSCOPY;  Service: Endoscopy;  Laterality: N/A;  . COLONOSCOPY N/A 11/02/2012   Procedure: COLONOSCOPY;  Surgeon: Cleotis Nipper, MD;  Location: [facility] ENDOSCOPY;  Service: Endoscopy;  Laterality: N/A;  . COLONOSCOPY N/A 11/03/2012   Procedure: COLONOSCOPY;  Surgeon: Cleotis Nipper, MD;  Location: [facility] ENDOSCOPY;  Service: Endoscopy;  Laterality: N/A;  . ENTEROSCOPY N/A 11/08/2012   Procedure: ENTEROSCOPY;  Surgeon: Wonda Horner, MD;  Location: [facility] ENDOSCOPY;  Service: Endoscopy;  Laterality: N/A;  . ESOPHAGOGASTRODUODENOSCOPY N/A 11/02/2012   Procedure: ESOPHAGOGASTRODUODENOSCOPY (EGD);  Surgeon: Cleotis Nipper, MD;  Location: [facility] ENDOSCOPY;  Service: Endoscopy;  Laterality: N/A;  .  EYE SURGERY Left    to remove scar tissue  . GIVENS CAPSULE STUDY N/A 11/04/2012   Procedure: GIVENS CAPSULE STUDY;  Surgeon: Cleotis Nipper, MD;  Location: [facility] ENDOSCOPY;  Service: Endoscopy;  Laterality: N/A;  . HARDWARE REMOVAL Left 06/09/2013   Procedure: HARDWARE REMOVAL;  Surgeon: Newt Minion, MD;  Location: [place];  Service: Orthopedics;  Laterality: Left;  Left Below Knee Amputation  and Removal proximal screws IM tibial nail  . NEPHRECTOMY     partial RR  . ORIF FIBULA FRACTURE Left 09/09/2012   Procedure: OPEN REDUCTION INTERNAL FIXATION (ORIF) FIBULA  FRACTURE;  Surgeon: Johnny Bridge, MD;  Location: [place];  Service: Orthopedics;  Laterality: Left;  . TIBIA IM NAIL INSERTION Left 09/09/2012   Procedure: INTRAMEDULLARY (IM) NAIL TIBIAL;  Surgeon: Johnny Bridge, MD;  Location: [place];  Service: Orthopedics;  Laterality: Left;  left tibial nail and open reduction internal fixation left fibula fracture    reports that he has never smoked. He has never used smokeless tobacco. He reports that he does not drink alcohol or use drugs. family history includes Coronary artery disease in his other; Diabetes in his mother; Heart attack in his father; Hypertension in his father and mother. No Known Allergies Current Outpatient Prescriptions on File Prior to Visit  Medication Sig Dispense Refill  . acetaminophen (TYLENOL) 500 MG tablet Take 500 mg by mouth every 6 (six) hours as needed for mild pain.    [name] atorvastatin (LIPITOR) 40 MG tablet TAKE 1 TABLET(40 MG) BY MOUTH DAILY AT 6 PM 30 tablet 0  . calcium acetate (PHOSLO) 667 MG capsule Take 2,001 mg by mouth 3 (three) times daily with meals.    [name] diltiazem (CARDIZEM CD) 120 MG 24 hr capsule Take 1 capsule (120 mg total) by mouth daily. 90 capsule 3  . hydrALAZINE (APRESOLINE) 100 MG tablet TAKE 1 TABLET BY MOUTH EVERY 8 HOURS 90 tablet 0  . hydrOXYzine (ATARAX/VISTARIL) 25 MG tablet TAKE 1 TABLET(25 MG) BY MOUTH EVERY 6 HOURS AS NEEDED 40 tablet 0  . ketoconazole (NIZORAL) 2 % cream Apply 1 application topically daily. 30 g 1  . lidocaine-prilocaine (EMLA) cream APPLY A SMALL AMOUNT TO SKIN AT THE ACCESS SITE (AVF) AS DIRECTED BEFORE EACH DIALYSIS SESSION THREE DAYS A WEEK  6  . metoprolol succinate (TOPROL-XL) 100 MG 24 hr tablet Take 1 tablet (100 mg total) by mouth daily. Take with or immediately following a meal. 90 tablet 3  . polyethylene glycol (MIRALAX / GLYCOLAX) packet Take 17 g by mouth 2 (two) times daily. 14 each 0  . PREVIDENT 5000 PLUS 1.1 % CREA dental cream Place 1 application onto teeth  every morning.   6  . QUEtiapine (SEROQUEL) 25 MG tablet Take 1 tablet (25 mg total) by mouth 2 (two) times daily. 60 tablet 5  . SENSIPAR 30 MG tablet Take 30 mg by mouth every evening.     [name] VOLTAREN 1 % GEL APPLY 2 GRAMS TO HANDS BID TO TID  0  . warfarin (COUMADIN) 1 MG tablet Take 1-1.5 mg by mouth See admin instructions. Take 1 and 1/2 tablets on Thursday, Friday and Saturday then take 1 tablet all the other days    . warfarin (COUMADIN) 2.5 MG tablet TAKE 1 TABLET BY MOUTH AS DIRECTED 40 tablet 3   No current facility-administered medications on file prior to visit.     Review of Systems  Constitutional: Negative for unusual diaphoresis or  night sweats HENT: Negative for ear swelling or discharge Eyes: Negative for worsening visual haziness  Respiratory: Negative for choking and stridor.   Gastrointestinal: Negative for distension or worsening eructation Genitourinary: Negative for retention or change in urine volume.  Musculoskeletal: Negative for other MSK pain or swelling Skin: Negative for color change and worsening wound Neurological: Negative for tremors and numbness other than noted  Psychiatric/Behavioral: Negative for decreased concentration or agitation other than above   All other system neg per pt    Objective:   Physical Exam BP 130/70   Pulse 90   Temp 98.4 F (36.9 C) (Oral)   Resp 20   Wt 225 lb (102.1 kg)   SpO2 96%   BMI 32.28 kg/m  VS noted, not ill appaering Constitutional: Pt appears in no apparent distress HENT: Head: NCAT.  Right Ear: External ear normal.  Left Ear: External ear normal.  Eyes: . Pupils are equal, round, and reactive to light. Conjunctivae and EOM are normal Neck: Normal range of motion. Neck supple.  Cardiovascular: Normal rate and regular rhythm.   Pulmonary/Chest: Effort normal and breath sounds without rales or wheezing.  Abd:  Soft, NT, ND, + BS - benign exam Joint exam - no effusions or tenderness Neurological: Pt is  alert. Not confused , motor grossly intact Skin: Skin is warm. No rash, no LE edema Psychiatric: Pt behavior is normal. No agitation.   Lab Results  Component Value Date   WBC 7.5 05/04/2016   HGB 9.9 (L) 05/04/2016   HCT 30.6 (L) 05/04/2016   PLT 267 05/04/2016   GLUCOSE 115 (H) 05/04/2016   CHOL 102 (L) 05/14/2016   TRIG 61 05/14/2016   HDL 36 (L) 05/14/2016   LDLCALC 54 05/14/2016   ALT 9 (L) 05/04/2016   AST 9 (L) 05/04/2016   NA 136 05/04/2016   K 4.6 05/04/2016   CL 98 (L) 05/04/2016   CREATININE 14.53 (H) 05/04/2016   BUN 98 (H) 05/04/2016   CO2 22 05/04/2016   TSH 0.48 06/13/2015   PSA 0.60 05/06/2011   INR 2.7 04/23/2016   HGBA1C 6.2 12/11/2015   MICROALBUR 220.0 (H) 08/06/2009       Assessment & Plan:

## 2016-06-01 NOTE — Progress Notes (Signed)
Pre visit review using our clinic review tool, if applicable. No additional management support is needed unless otherwise documented below in the visit note. 

## 2016-06-02 DIAGNOSIS — N2581 Secondary hyperparathyroidism of renal origin: Secondary | ICD-10-CM | POA: Diagnosis not present

## 2016-06-02 DIAGNOSIS — N186 End stage renal disease: Secondary | ICD-10-CM | POA: Diagnosis not present

## 2016-06-02 DIAGNOSIS — E1122 Type 2 diabetes mellitus with diabetic chronic kidney disease: Secondary | ICD-10-CM | POA: Diagnosis not present

## 2016-06-04 ENCOUNTER — Ambulatory Visit (INDEPENDENT_AMBULATORY_CARE_PROVIDER_SITE_OTHER): Payer: Medicare Other | Admitting: *Deleted

## 2016-06-04 DIAGNOSIS — N186 End stage renal disease: Secondary | ICD-10-CM | POA: Diagnosis not present

## 2016-06-04 DIAGNOSIS — I638 Other cerebral infarction: Secondary | ICD-10-CM

## 2016-06-04 DIAGNOSIS — E1122 Type 2 diabetes mellitus with diabetic chronic kidney disease: Secondary | ICD-10-CM | POA: Diagnosis not present

## 2016-06-04 DIAGNOSIS — I6389 Other cerebral infarction: Secondary | ICD-10-CM

## 2016-06-04 DIAGNOSIS — N2581 Secondary hyperparathyroidism of renal origin: Secondary | ICD-10-CM | POA: Diagnosis not present

## 2016-06-04 DIAGNOSIS — I4891 Unspecified atrial fibrillation: Secondary | ICD-10-CM

## 2016-06-04 LAB — POCT INR: INR: 1.8

## 2016-06-05 ENCOUNTER — Other Ambulatory Visit (INDEPENDENT_AMBULATORY_CARE_PROVIDER_SITE_OTHER): Payer: Self-pay

## 2016-06-05 MED ORDER — SILVER SULFADIAZINE 1 % EX CREA: 1.0000 "application " | TOPICAL_CREAM | Freq: Every day | CUTANEOUS | 0 refills | Status: DC

## 2016-06-05 NOTE — Telephone Encounter (Signed)
Refill request from pharm sent to Dr. Sharol Given for review.

## 2016-06-06 DIAGNOSIS — E1122 Type 2 diabetes mellitus with diabetic chronic kidney disease: Secondary | ICD-10-CM | POA: Diagnosis not present

## 2016-06-06 DIAGNOSIS — N186 End stage renal disease: Secondary | ICD-10-CM | POA: Diagnosis not present

## 2016-06-06 DIAGNOSIS — N2581 Secondary hyperparathyroidism of renal origin: Secondary | ICD-10-CM | POA: Diagnosis not present

## 2016-06-08 NOTE — Assessment & Plan Note (Signed)
stable overall by history and exam, recent data reviewed with pt, and pt to continue medical treatment as before,  to f/u any worsening symptoms or concerns BP Readings from Last 3 Encounters:  06/01/16 130/70  05/14/16 118/68  05/04/16 131/80

## 2016-06-08 NOTE — Assessment & Plan Note (Signed)
Christopher for miralax restart,  to f/u any worsening symptoms or concerns

## 2016-06-09 DIAGNOSIS — E1122 Type 2 diabetes mellitus with diabetic chronic kidney disease: Secondary | ICD-10-CM | POA: Diagnosis not present

## 2016-06-09 DIAGNOSIS — N2581 Secondary hyperparathyroidism of renal origin: Secondary | ICD-10-CM | POA: Diagnosis not present

## 2016-06-09 DIAGNOSIS — N186 End stage renal disease: Secondary | ICD-10-CM | POA: Diagnosis not present

## 2016-06-10 ENCOUNTER — Ambulatory Visit: Payer: Medicare Other | Admitting: Internal Medicine

## 2016-06-12 DIAGNOSIS — H401112 Primary open-angle glaucoma, right eye, moderate stage: Secondary | ICD-10-CM | POA: Diagnosis not present

## 2016-06-13 DIAGNOSIS — E1122 Type 2 diabetes mellitus with diabetic chronic kidney disease: Secondary | ICD-10-CM | POA: Diagnosis not present

## 2016-06-13 DIAGNOSIS — N2581 Secondary hyperparathyroidism of renal origin: Secondary | ICD-10-CM | POA: Diagnosis not present

## 2016-06-13 DIAGNOSIS — N186 End stage renal disease: Secondary | ICD-10-CM | POA: Diagnosis not present

## 2016-06-15 DIAGNOSIS — N2581 Secondary hyperparathyroidism of renal origin: Secondary | ICD-10-CM | POA: Diagnosis not present

## 2016-06-15 DIAGNOSIS — N186 End stage renal disease: Secondary | ICD-10-CM | POA: Diagnosis not present

## 2016-06-15 DIAGNOSIS — E1122 Type 2 diabetes mellitus with diabetic chronic kidney disease: Secondary | ICD-10-CM | POA: Diagnosis not present

## 2016-06-16 ENCOUNTER — Encounter (HOSPITAL_COMMUNITY): Payer: Self-pay

## 2016-06-16 ENCOUNTER — Emergency Department (HOSPITAL_COMMUNITY)
Admission: EM | Admit: 2016-06-16 | Discharge: 2016-06-16 | Disposition: A | Discharge status: Home / Self Care | Payer: Medicare Other | Attending: Emergency Medicine | Admitting: Emergency Medicine

## 2016-06-16 DIAGNOSIS — Z8673 Personal history of transient ischemic attack (TIA), and cerebral infarction without residual deficits: Secondary | ICD-10-CM | POA: Diagnosis not present

## 2016-06-16 DIAGNOSIS — R1010 Upper abdominal pain, unspecified: Secondary | ICD-10-CM | POA: Diagnosis not present

## 2016-06-16 DIAGNOSIS — I5041 Acute combined systolic (congestive) and diastolic (congestive) heart failure: Secondary | ICD-10-CM | POA: Insufficient documentation

## 2016-06-16 DIAGNOSIS — Z7901 Long term (current) use of anticoagulants: Secondary | ICD-10-CM | POA: Insufficient documentation

## 2016-06-16 DIAGNOSIS — Z992 Dependence on renal dialysis: Secondary | ICD-10-CM | POA: Diagnosis not present

## 2016-06-16 DIAGNOSIS — E1122 Type 2 diabetes mellitus with diabetic chronic kidney disease: Secondary | ICD-10-CM | POA: Insufficient documentation

## 2016-06-16 DIAGNOSIS — I132 Hypertensive heart and chronic kidney disease with heart failure and with stage 5 chronic kidney disease, or end stage renal disease: Secondary | ICD-10-CM | POA: Diagnosis not present

## 2016-06-16 DIAGNOSIS — N186 End stage renal disease: Secondary | ICD-10-CM | POA: Insufficient documentation

## 2016-06-16 DIAGNOSIS — R1012 Left upper quadrant pain: Secondary | ICD-10-CM | POA: Diagnosis not present

## 2016-06-16 LAB — CBC WITH DIFFERENTIAL/PLATELET
Basophils Absolute: 0 10*3/uL (ref 0.0–0.1)
Basophils Relative: 0 %
EOS ABS: 0.1 10*3/uL (ref 0.0–0.7)
EOS PCT: 1 %
HCT: 36.2 % — ABNORMAL LOW (ref 39.0–52.0)
Hemoglobin: 11.7 g/dL — ABNORMAL LOW (ref 13.0–17.0)
LYMPHS ABS: 1.4 10*3/uL (ref 0.7–4.0)
Lymphocytes Relative: 12 %
MCH: 28.9 pg (ref 26.0–34.0)
MCHC: 32.3 g/dL (ref 30.0–36.0)
MCV: 89.4 fL (ref 78.0–100.0)
MONOS PCT: 5 %
Monocytes Absolute: 0.6 10*3/uL (ref 0.1–1.0)
Neutro Abs: 9.5 10*3/uL — ABNORMAL HIGH (ref 1.7–7.7)
Neutrophils Relative %: 82 %
PLATELETS: 229 10*3/uL (ref 150–400)
RBC: 4.05 MIL/uL — ABNORMAL LOW (ref 4.22–5.81)
RDW: 16 % — ABNORMAL HIGH (ref 11.5–15.5)
WBC: 11.5 10*3/uL — ABNORMAL HIGH (ref 4.0–10.5)

## 2016-06-16 LAB — COMPREHENSIVE METABOLIC PANEL
ALT: 11 U/L — AB (ref 17–63)
AST: 15 U/L (ref 15–41)
Albumin: 3.4 g/dL — ABNORMAL LOW (ref 3.5–5.0)
Alkaline Phosphatase: 46 U/L (ref 38–126)
Anion gap: 15 (ref 5–15)
BUN: 52 mg/dL — AB (ref 6–20)
CHLORIDE: 95 mmol/L — AB (ref 101–111)
CO2: 24 mmol/L (ref 22–32)
CREATININE: 8.26 mg/dL — AB (ref 0.61–1.24)
Calcium: 8.7 mg/dL — ABNORMAL LOW (ref 8.9–10.3)
GFR calc Af Amer: 7 mL/min — ABNORMAL LOW (ref >60)
GFR, EST NON AFRICAN AMERICAN: 6 mL/min — AB (ref >60)
Glucose, Bld: 166 mg/dL — ABNORMAL HIGH (ref 65–99)
Potassium: 4.3 mmol/L (ref 3.5–5.1)
SODIUM: 134 mmol/L — AB (ref 135–145)
Total Bilirubin: 0.5 mg/dL (ref 0.3–1.2)
Total Protein: 7.7 g/dL (ref 6.5–8.1)

## 2016-06-16 LAB — LIPASE, BLOOD: LIPASE: 45 U/L (ref 11–51)

## 2016-06-16 MED ORDER — ONDANSETRON 4 MG PO TBDP: ORAL_TABLET | ORAL | 0 refills | Status: DC

## 2016-06-16 MED ORDER — ONDANSETRON 4 MG PO TBDP
4.0000 mg | ORAL_TABLET | Freq: Once | ORAL | Status: AC
  Administered 2016-06-16: 4 mg via ORAL
  Filled 2016-06-16: qty 1

## 2016-06-16 MED ORDER — DICYCLOMINE HCL 20 MG PO TABS: ORAL_TABLET | ORAL | 0 refills | Status: DC

## 2016-06-16 MED ORDER — PANTOPRAZOLE SODIUM 40 MG PO TBEC
40.0000 mg | DELAYED_RELEASE_TABLET | Freq: Once | ORAL | Status: AC
  Administered 2016-06-16: 40 mg via ORAL
  Filled 2016-06-16: qty 1

## 2016-06-16 NOTE — ED Notes (Signed)
Phlebotomy made aware of need for labs to be drawn

## 2016-06-16 NOTE — ED Triage Notes (Signed)
Per Pt, Pt started to have RUQ abdominal pain, vomiting, and nausea that started yesterday. Denies Hx of the same or Diarrhea.

## 2016-06-16 NOTE — Discharge Instructions (Signed)
Follow-up with your family doctor next week if not improving °

## 2016-06-16 NOTE — ED Provider Notes (Signed)
[facility] DEPT Provider Note   CSN: [id number] Arrival date & time: 06/16/16  1012     History   Chief Complaint Chief Complaint  Patient presents with  . Abdominal Pain    HPI Oscar Castillo is a 65 y.o. male.  Patient states he's had some nausea since last night and vomited twice with some left upper quadrant abdominal discomfort. The nausea is improving and the pain is gone   The history is provided by the patient. No language interpreter was used.  Abdominal Pain   This is a new problem. The current episode started 12 to 24 hours ago. The problem occurs rarely. The problem has been resolved. The pain is associated with an unknown factor. The pain is located in the LUQ. The quality of the pain is aching. The pain is at a severity of 2/10. The pain is mild. Associated symptoms include nausea. Pertinent negatives include diarrhea, frequency, hematuria and headaches.    Past Medical History:  Diagnosis Date  . Anemia   . Antral ulcer 2014   small  . BACK PAIN, LUMBAR, CHRONIC   . BENIGN PROSTATIC HYPERTROPHY   . CEREBROVASCULAR ACCIDENT, HX OF   . CHOLELITHIASIS   . Chronic combined systolic and diastolic CHF (congestive heart failure) ([place])   . Complication of anesthesia    wife states pt had trouble waking up with his last surgery in Nov., 2014  . DEPRESSION   . DIABETES MELLITUS, TYPE II   . ERECTILE DYSFUNCTION   . ESRD on hemodialysis ([place])    ESRD due to DM/HTN. Started dialysis in November 2013.  HD TTS at [facility] on [place].  [name] GERD   . Hemorrhoids   . HEPATITIS C, HX OF   . History of Clostridium difficile   . HYPERTENSION   . LBBB (left bundle branch block)   . Morbid obesity ([place])   . NEPHROLITHIASIS, HX OF   . PAF (paroxysmal atrial fibrillation) ([place])    a. Dx 12/2015.  . S/P bilateral BKA (below knee amputation) [facility]     Patient Active Problem List   Diagnosis Date Noted  . New onset a-fib ([place]) 12/27/2015  . Glaucoma 12/27/2015    . LBBB (left bundle branch block)   . Rash 11/15/2015  . Cramping of hands 11/15/2015  . Liver fibrosis ([place]) 10/07/2015  . Diarrhea 08/14/2014  . Dehydration 10/02/2013  . Fever 10/02/2013  . Altered mental status 10/01/2013  . Encephalopathy, toxic 10/01/2013  . Encephalopathy, metabolic 99991111  . CVA (cerebral vascular accident) ([place]) 09/28/2013  . FTT (failure to thrive) in adult 09/28/2013  . Acute confusional state 09/28/2013  . Ulcer of sacral region, stage 3 ([place]) 09/26/2013  . S/P BKA (below knee amputation) bilateral ([place]) 09/08/2013  . ESRD on hemodialysis ([place]) 05/09/2013  . TIA (transient ischemic attack) 02/20/2013  . Acute blood loss anemia 10/28/2012  . Chronic combined systolic and diastolic congestive heart failure 10/28/2012  . Constipation 10/28/2012  . GI bleed 10/27/2012  . Mass in rectum 10/27/2012  . DM (diabetes mellitus) type I controlled with renal manifestation ([place]) 09/08/2012  . LVH (left ventricular hypertrophy)-severe concentric 06/13/2012  . Anemia due to chronic illness 06/12/2012  . Hyperlipidemia 01/30/2011  . CHOLELITHIASIS 08/01/2010  . BENIGN PROSTATIC HYPERTROPHY 08/01/2010  . CEREBROVASCULAR ACCIDENT, HX OF 08/06/2009  . Depression 03/18/2009  . Acute combined systolic and diastolic heart failure, NYHA class 2-EF 45% 03/18/2009  . NEPHROLITHIASIS, HX OF 03/18/2009  . Morbid  obesity ([place]) 03/25/2007  . Essential hypertension 03/25/2007  . GERD 03/25/2007  . Chronic hepatitis C without hepatic coma ([place]) 03/25/2007    Past Surgical History:  Procedure Laterality Date  . AMPUTATION Left 05/12/2013   Procedure: AMPUTATION RAY;  Surgeon: Newt Minion, MD;  Location: [facility];  Service: Orthopedics;  Laterality: Left;  Left Foot 1st Ray Amputation  . AMPUTATION Left 06/09/2013   Procedure: AMPUTATION BELOW KNEE;  Surgeon: Newt Minion, MD;  Location: [facility];  Service: Orthopedics;  Laterality: Left;  Left Below Knee Amputation and  removal proximal screws IM tibial nail  . AMPUTATION Right 09/08/2013   Procedure: AMPUTATION BELOW KNEE;  Surgeon: Newt Minion, MD;  Location: [facility];  Service: Orthopedics;  Laterality: Right;  Right Below Knee Amputation  . AMPUTATION Right 10/11/2013   Procedure: AMPUTATION BELOW KNEE;  Surgeon: Newt Minion, MD;  Location: [facility];  Service: Orthopedics;  Laterality: Right;  Right Below Knee Amputation Revision  . AV FISTULA PLACEMENT  06/14/2012   Procedure: ARTERIOVENOUS (AV) FISTULA CREATION;  Surgeon: Angelia Mould, MD;  Location[facility] OR;  Service: Vascular;  Laterality: Left;  Left basilic vein transposition with fistula.  . COLONOSCOPY N/A 10/28/2012   Procedure: COLONOSCOPY;  Surgeon: Jeryl Columbia, MD;  Location: [facility] ENDOSCOPY;  Service: Endoscopy;  Laterality: N/A;  . COLONOSCOPY N/A 11/02/2012   Procedure: COLONOSCOPY;  Surgeon: Cleotis Nipper, MD;  Location: [facility] ENDOSCOPY;  Service: Endoscopy;  Laterality: N/A;  . COLONOSCOPY N/A 11/03/2012   Procedure: COLONOSCOPY;  Surgeon: Cleotis Nipper, MD;  Location: [facility] ENDOSCOPY;  Service: Endoscopy;  Laterality: N/A;  . ENTEROSCOPY N/A 11/08/2012   Procedure: ENTEROSCOPY;  Surgeon: Wonda Horner, MD;  Location: [facility] ENDOSCOPY;  Service: Endoscopy;  Laterality: N/A;  . ESOPHAGOGASTRODUODENOSCOPY N/A 11/02/2012   Procedure: ESOPHAGOGASTRODUODENOSCOPY (EGD);  Surgeon: Cleotis Nipper, MD;  Location: [facility] ENDOSCOPY;  Service: Endoscopy;  Laterality: N/A;  . EYE SURGERY Left    to remove scar tissue  . GIVENS CAPSULE STUDY N/A 11/04/2012   Procedure: GIVENS CAPSULE STUDY;  Surgeon: Cleotis Nipper, MD;  Location[facility]COPY;  Service: Endoscopy;  Laterality: N/A;  . HARDWARE REMOVAL Left 06/09/2013   Procedure: HARDWARE REMOVAL;  Surgeon: Newt Minion, MD;  Location: [facility];  Service: Orthopedics;  Laterality: Left;  Left Below Knee Amputation  and Removal proximal screws IM tibial nail  . NEPHRECTOMY     partial RR  . ORIF FIBULA FRACTURE  Left 09/09/2012   Procedure: OPEN REDUCTION INTERNAL FIXATION (ORIF) FIBULA FRACTURE;  Surgeon: Johnny Bridge, MD;  Location: [facility] [place];  Service: Orthopedics;  Laterality: Left;  . TIBIA IM NAIL INSERTION Left 09/09/2012   Procedure: INTRAMEDULLARY (IM) NAIL TIBIAL;  Surgeon: Johnny Bridge, MD;  Location: [facility];  Service: Orthopedics;  Laterality: Left;  left tibial nail and open reduction internal fixation left fibula fracture       Home Medications    Prior to Admission medications   Medication Sig Start Date End Date Taking? Authorizing Provider  acetaminophen (TYLENOL) 500 MG tablet Take 500 mg by mouth every 6 (six) hours as needed for mild pain.   Yes Historical Provider, MD  atorvastatin (LIPITOR) 40 MG tablet TAKE 1 TABLET(40 MG) BY MOUTH DAILY AT 6 PM 05/21/16  Yes [name], MD  calcium acetate (PHOSLO) 667 MG capsule Take 2,001 mg by mouth 3 (three) times daily with meals.   Yes Historical Provider, MD  diltiazem (  CARDIZEM CD) 120 MG 24 hr capsule Take 1 capsule (120 mg total) by mouth daily. 01/09/16  Yes [name], MD  hydrALAZINE (APRESOLINE) 100 MG tablet TAKE 1 TABLET BY MOUTH EVERY 8 HOURS 06/01/16  Yes [name], MD  hydrOXYzine (ATARAX/VISTARIL) 25 MG tablet TAKE 1 TABLET(25 MG) BY MOUTH EVERY 6 HOURS AS NEEDED 06/01/16  Yes [name], MD  ketoconazole (NIZORAL) 2 % cream Apply 1 application topically daily. 11/13/15  Yes [name], MD  lidocaine-prilocaine (EMLA) cream APPLY A SMALL AMOUNT TO SKIN AT THE ACCESS SITE (AVF) AS DIRECTED BEFORE EACH DIALYSIS SESSION THREE DAYS A WEEK 06/03/15  Yes Historical Provider, MD  metoprolol succinate (TOPROL-XL) 100 MG 24 hr tablet Take 1 tablet (100 mg total) by mouth daily. Take with or immediately following a meal. 01/09/16  Yes [name], MD  polyethylene glycol [facility] Floria Raveling) packet Take 17 g by mouth 2 (two) times daily. 05/04/16  Yes Merryl Hacker, MD  PREVIDENT 5000 PLUS 1.1 % CREA dental cream Place 1  application onto teeth every morning.  04/24/15  Yes Historical Provider, MD  QUEtiapine (SEROQUEL) 25 MG tablet Take 1 tablet (25 mg total) by mouth 2 (two) times daily. 04/07/16  Yes [name], MD  SENSIPAR 30 MG tablet Take 30 mg by mouth every evening.  01/09/15  Yes Historical Provider, MD  silver sulfADIAZINE (SILVADENE) 1 % cream Apply 1 application topically daily. 06/05/16  Yes Newt Minion, MD  traMADol (ULTRAM) 50 MG tablet Take 1 tablet (50 mg total) by mouth every 8 (eight) hours as needed. 06/01/16  Yes [name], MD  VOLTAREN 1 % GEL APPLY 2 GRAMS TO HANDS BID TO TID 09/19/15  Yes Historical Provider, MD  warfarin (COUMADIN) 1 MG tablet Take 1-1.5 mg by mouth See admin instructions. Take 1 and 1/2 tablets on Thursday, Friday and Saturday then take 1 tablet all the other days   Yes Historical Provider, MD  warfarin (COUMADIN) 2.5 MG tablet Take 2.5-3.75 mg by mouth daily. Patient takes 3.75 mg on Monday,Wednesday,Thursday,Saturday all other days patient takes 2.5 mg   Yes Historical Provider, MD  dicyclomine (BENTYL) 20 MG tablet Take one every 8 hours for abdominal cramps 06/16/16   Milton Ferguson, MD  dicyclomine (BENTYL) 20 MG tablet Take one every 8-12 hours for abdominal cramps 06/16/16   Milton Ferguson, MD  dicyclomine (BENTYL) 20 MG tablet Take one every 8-12 hours for abdominal cramps 06/16/16   Milton Ferguson, MD  ondansetron (ZOFRAN ODT) 4 MG disintegrating tablet 4mg  ODT q4 hours prn nausea/vomit 06/16/16   Milton Ferguson, MD  ondansetron (ZOFRAN ODT) 4 MG disintegrating tablet 4mg  ODT q4 hours prn nausea/vomit 06/16/16   Milton Ferguson, MD  ondansetron (ZOFRAN ODT) 4 MG disintegrating tablet 4mg  ODT q4 hours prn nausea/vomit 06/16/16   Milton Ferguson, MD  warfarin (COUMADIN) 2.5 MG tablet TAKE 1 TABLET BY MOUTH AS DIRECTED Patient not taking: Reported on 06/16/2016 06/01/16   Fay Records, MD  warfarin (COUMADIN) 2.5 MG tablet Take 2.5-3.75 mg by mouth See admin instructions.  3.75 mg on Monday,Wednesday,    Historical Provider, MD    Family History Family History  Problem Relation Age of Onset  . Diabetes Mother   . Hypertension Mother   . Heart attack Father   . Hypertension Father   . Coronary artery disease Other     Social History Social History  Substance Use Topics  . Smoking  status: Never Smoker  . Smokeless tobacco: Never Used  . Alcohol use No     Allergies   Patient has no known allergies.   Review of Systems Review of Systems  Constitutional: Negative for appetite change and fatigue.  HENT: Negative for congestion, ear discharge and sinus pressure.   Eyes: Negative for discharge.  Respiratory: Negative for cough.   Cardiovascular: Negative for chest pain.  Gastrointestinal: Positive for abdominal pain and nausea. Negative for diarrhea.  Genitourinary: Negative for frequency and hematuria.  Musculoskeletal: Negative for back pain.  Skin: Negative for rash.  Neurological: Negative for seizures and headaches.  Psychiatric/Behavioral: Negative for hallucinations.     Physical Exam Updated Vital Signs BP 142/95   Pulse 84   Temp 98.4 F (36.9 C) (Oral)   Resp 17   Ht 5\' 10"  (1.778 m)   Wt 225 lb (102.1 kg)   SpO2 100%   BMI 32.28 kg/m   Physical Exam  Constitutional: He is oriented to person, place, and time. He appears well-developed.  HENT:  Head: Normocephalic.  Eyes: Conjunctivae and EOM are normal. No scleral icterus.  Neck: Neck supple. No thyromegaly present.  Cardiovascular: Normal rate and regular rhythm.  Exam reveals no gallop and no friction rub.   No murmur heard. Pulmonary/Chest: No stridor. He has no wheezes. He has no rales. He exhibits no tenderness.  Abdominal: He exhibits no distension. There is no tenderness. There is no rebound.  Musculoskeletal: Normal range of motion. He exhibits no edema.  Lymphadenopathy:    He has no cervical adenopathy.  Neurological: He is oriented to person, place, and  time. He exhibits normal muscle tone. Coordination normal.  Skin: No rash noted. No erythema.  Psychiatric: He has a normal mood and affect. His behavior is normal.     ED Treatments / Results  Labs (all labs ordered are listed, but only abnormal results are displayed) Labs Reviewed  COMPREHENSIVE METABOLIC PANEL - Abnormal; Notable for the following:       Result Value   Sodium 134 (*)    Chloride 95 (*)    Glucose, Bld 166 (*)    BUN 52 (*)    Creatinine, Ser 8.26 (*)    Calcium 8.7 (*)    Albumin 3.4 (*)    ALT 11 (*)    GFR calc non Af Amer 6 (*)    GFR calc Af Amer 7 (*)    All other components within normal limits  CBC WITH DIFFERENTIAL/PLATELET - Abnormal; Notable for the following:    WBC 11.5 (*)    RBC 4.05 (*)    Hemoglobin 11.7 (*)    HCT 36.2 (*)    RDW 16.0 (*)    Neutro Abs 9.5 (*)    All other components within normal limits  LIPASE, BLOOD    EKG  EKG Interpretation None       Radiology No results found.  Procedures Procedures (including critical care time)  Medications Ordered in ED Medications  ondansetron (ZOFRAN-ODT) disintegrating tablet 4 mg (4 mg Oral Given 06/16/16 1100)  pantoprazole (PROTONIX) EC tablet 40 mg (40 mg Oral Given 06/16/16 1100)     Initial Impression / Assessment and Plan / ED Course  I have reviewed the triage vital signs and the nursing notes.  Pertinent labs & imaging results that were available during my care of the patient were reviewed by me and considered in my medical decision making (see chart for details).  Clinical Course  Patient with nausea and abdominal pain. Symptoms improved with Zofran and protonic. Labs unremarkable except for kidney failure. Patient sent home and will follow-up with his PCP. He was given Zofran and Bentyl  Final Clinical Impressions(s) / ED Diagnoses   Final diagnoses:  Pain of upper abdomen    New Prescriptions New Prescriptions   DICYCLOMINE (BENTYL) 20 MG TABLET     Take one every 8 hours for abdominal cramps   DICYCLOMINE (BENTYL) 20 MG TABLET    Take one every 8-12 hours for abdominal cramps   DICYCLOMINE (BENTYL) 20 MG TABLET    Take one every 8-12 hours for abdominal cramps   ONDANSETRON (ZOFRAN ODT) 4 MG DISINTEGRATING TABLET    4mg  ODT q4 hours prn nausea/vomit   ONDANSETRON (ZOFRAN ODT) 4 MG DISINTEGRATING TABLET    4mg  ODT q4 hours prn nausea/vomit   ONDANSETRON (ZOFRAN ODT) 4 MG DISINTEGRATING TABLET    4mg  ODT q4 hours prn nausea/vomit     Milton Ferguson, MD 06/16/16 1452

## 2016-06-16 NOTE — ED Notes (Signed)
This RN tried twice to get an Iv on pt. Both attempts blow.

## 2016-06-20 ENCOUNTER — Other Ambulatory Visit: Payer: Self-pay | Admitting: Internal Medicine

## 2016-06-20 DIAGNOSIS — N2581 Secondary hyperparathyroidism of renal origin: Secondary | ICD-10-CM | POA: Diagnosis not present

## 2016-06-20 DIAGNOSIS — N186 End stage renal disease: Secondary | ICD-10-CM | POA: Diagnosis not present

## 2016-06-20 DIAGNOSIS — I4891 Unspecified atrial fibrillation: Secondary | ICD-10-CM | POA: Diagnosis not present

## 2016-06-20 DIAGNOSIS — E1122 Type 2 diabetes mellitus with diabetic chronic kidney disease: Secondary | ICD-10-CM | POA: Diagnosis not present

## 2016-06-20 DIAGNOSIS — Z5181 Encounter for therapeutic drug level monitoring: Secondary | ICD-10-CM | POA: Diagnosis not present

## 2016-06-23 DIAGNOSIS — N2581 Secondary hyperparathyroidism of renal origin: Secondary | ICD-10-CM | POA: Diagnosis not present

## 2016-06-23 DIAGNOSIS — N186 End stage renal disease: Secondary | ICD-10-CM | POA: Diagnosis not present

## 2016-06-23 DIAGNOSIS — E1122 Type 2 diabetes mellitus with diabetic chronic kidney disease: Secondary | ICD-10-CM | POA: Diagnosis not present

## 2016-06-25 ENCOUNTER — Ambulatory Visit (INDEPENDENT_AMBULATORY_CARE_PROVIDER_SITE_OTHER): Payer: Medicare Other | Admitting: *Deleted

## 2016-06-25 DIAGNOSIS — I639 Cerebral infarction, unspecified: Secondary | ICD-10-CM | POA: Diagnosis not present

## 2016-06-25 DIAGNOSIS — N186 End stage renal disease: Secondary | ICD-10-CM | POA: Diagnosis not present

## 2016-06-25 DIAGNOSIS — Z992 Dependence on renal dialysis: Secondary | ICD-10-CM | POA: Diagnosis not present

## 2016-06-25 DIAGNOSIS — I638 Other cerebral infarction: Secondary | ICD-10-CM | POA: Diagnosis not present

## 2016-06-25 DIAGNOSIS — I6389 Other cerebral infarction: Secondary | ICD-10-CM

## 2016-06-25 DIAGNOSIS — E1122 Type 2 diabetes mellitus with diabetic chronic kidney disease: Secondary | ICD-10-CM | POA: Diagnosis not present

## 2016-06-25 DIAGNOSIS — N2581 Secondary hyperparathyroidism of renal origin: Secondary | ICD-10-CM | POA: Diagnosis not present

## 2016-06-25 DIAGNOSIS — I4891 Unspecified atrial fibrillation: Secondary | ICD-10-CM | POA: Diagnosis not present

## 2016-06-25 LAB — POCT INR: INR: 1.9

## 2016-06-27 DIAGNOSIS — E1122 Type 2 diabetes mellitus with diabetic chronic kidney disease: Secondary | ICD-10-CM | POA: Diagnosis not present

## 2016-06-27 DIAGNOSIS — N186 End stage renal disease: Secondary | ICD-10-CM | POA: Diagnosis not present

## 2016-06-27 DIAGNOSIS — D631 Anemia in chronic kidney disease: Secondary | ICD-10-CM | POA: Diagnosis not present

## 2016-06-27 DIAGNOSIS — N2581 Secondary hyperparathyroidism of renal origin: Secondary | ICD-10-CM | POA: Diagnosis not present

## 2016-06-27 DIAGNOSIS — D509 Iron deficiency anemia, unspecified: Secondary | ICD-10-CM | POA: Diagnosis not present

## 2016-07-01 ENCOUNTER — Encounter (HOSPITAL_COMMUNITY): Payer: Self-pay | Admitting: Emergency Medicine

## 2016-07-01 ENCOUNTER — Emergency Department (HOSPITAL_COMMUNITY): Payer: Medicare Other

## 2016-07-01 ENCOUNTER — Telehealth: Payer: Self-pay | Admitting: Internal Medicine

## 2016-07-01 ENCOUNTER — Other Ambulatory Visit (HOSPITAL_COMMUNITY): Payer: Self-pay

## 2016-07-01 ENCOUNTER — Non-Acute Institutional Stay (HOSPITAL_COMMUNITY): Payer: Medicare Other

## 2016-07-01 ENCOUNTER — Non-Acute Institutional Stay (HOSPITAL_COMMUNITY)
Admission: EM | Admit: 2016-07-01 | Discharge: 2016-07-05 | Disposition: A | Discharge status: Home / Self Care | Payer: Medicare Other | Attending: Emergency Medicine | Admitting: Emergency Medicine

## 2016-07-01 DIAGNOSIS — Z905 Acquired absence of kidney: Secondary | ICD-10-CM | POA: Diagnosis not present

## 2016-07-01 DIAGNOSIS — Z992 Dependence on renal dialysis: Secondary | ICD-10-CM | POA: Insufficient documentation

## 2016-07-01 DIAGNOSIS — I480 Paroxysmal atrial fibrillation: Secondary | ICD-10-CM | POA: Diagnosis not present

## 2016-07-01 DIAGNOSIS — I132 Hypertensive heart and chronic kidney disease with heart failure and with stage 5 chronic kidney disease, or end stage renal disease: Secondary | ICD-10-CM | POA: Insufficient documentation

## 2016-07-01 DIAGNOSIS — K8020 Calculus of gallbladder without cholecystitis without obstruction: Secondary | ICD-10-CM | POA: Insufficient documentation

## 2016-07-01 DIAGNOSIS — E785 Hyperlipidemia, unspecified: Secondary | ICD-10-CM | POA: Insufficient documentation

## 2016-07-01 DIAGNOSIS — Z7901 Long term (current) use of anticoagulants: Secondary | ICD-10-CM | POA: Insufficient documentation

## 2016-07-01 DIAGNOSIS — K5900 Constipation, unspecified: Secondary | ICD-10-CM | POA: Insufficient documentation

## 2016-07-01 DIAGNOSIS — F329 Major depressive disorder, single episode, unspecified: Secondary | ICD-10-CM | POA: Insufficient documentation

## 2016-07-01 DIAGNOSIS — B182 Chronic viral hepatitis C: Secondary | ICD-10-CM | POA: Insufficient documentation

## 2016-07-01 DIAGNOSIS — E1122 Type 2 diabetes mellitus with diabetic chronic kidney disease: Secondary | ICD-10-CM | POA: Diagnosis not present

## 2016-07-01 DIAGNOSIS — R079 Chest pain, unspecified: Secondary | ICD-10-CM | POA: Diagnosis not present

## 2016-07-01 DIAGNOSIS — Z79899 Other long term (current) drug therapy: Secondary | ICD-10-CM | POA: Diagnosis not present

## 2016-07-01 DIAGNOSIS — Z89512 Acquired absence of left leg below knee: Secondary | ICD-10-CM | POA: Insufficient documentation

## 2016-07-01 DIAGNOSIS — Z8673 Personal history of transient ischemic attack (TIA), and cerebral infarction without residual deficits: Secondary | ICD-10-CM | POA: Insufficient documentation

## 2016-07-01 DIAGNOSIS — R1011 Right upper quadrant pain: Secondary | ICD-10-CM | POA: Insufficient documentation

## 2016-07-01 DIAGNOSIS — Z6832 Body mass index (BMI) 32.0-32.9, adult: Secondary | ICD-10-CM | POA: Diagnosis not present

## 2016-07-01 DIAGNOSIS — E875 Hyperkalemia: Secondary | ICD-10-CM | POA: Insufficient documentation

## 2016-07-01 DIAGNOSIS — Z89511 Acquired absence of right leg below knee: Secondary | ICD-10-CM | POA: Insufficient documentation

## 2016-07-01 DIAGNOSIS — I5042 Chronic combined systolic (congestive) and diastolic (congestive) heart failure: Secondary | ICD-10-CM | POA: Insufficient documentation

## 2016-07-01 DIAGNOSIS — N186 End stage renal disease: Secondary | ICD-10-CM | POA: Insufficient documentation

## 2016-07-01 LAB — HEPATIC FUNCTION PANEL
ALBUMIN: 3.5 g/dL (ref 3.5–5.0)
ALT: 9 U/L — ABNORMAL LOW (ref 17–63)
AST: 11 U/L — ABNORMAL LOW (ref 15–41)
Alkaline Phosphatase: 51 U/L (ref 38–126)
BILIRUBIN TOTAL: 0.5 mg/dL (ref 0.3–1.2)
Bilirubin, Direct: <0.1 mg/dL — ABNORMAL LOW (ref 0.1–0.5)
Total Protein: 8 g/dL (ref 6.5–8.1)

## 2016-07-01 LAB — CBC
HCT: 37.9 % — ABNORMAL LOW (ref 39.0–52.0)
HEMOGLOBIN: 12.2 g/dL — AB (ref 13.0–17.0)
MCH: 28.8 pg (ref 26.0–34.0)
MCHC: 32.2 g/dL (ref 30.0–36.0)
MCV: 89.4 fL (ref 78.0–100.0)
PLATELETS: 325 10*3/uL (ref 150–400)
RBC: 4.24 MIL/uL (ref 4.22–5.81)
RDW: 15.3 % (ref 11.5–15.5)
WBC: 7.4 10*3/uL (ref 4.0–10.5)

## 2016-07-01 LAB — PROTIME-INR
INR: 1.9
Prothrombin Time: 22.1 seconds — ABNORMAL HIGH (ref 11.4–15.2)

## 2016-07-01 LAB — RENAL FUNCTION PANEL
ALBUMIN: 3 g/dL — AB (ref 3.5–5.0)
ANION GAP: 16 — AB (ref 5–15)
BUN: 72 mg/dL — ABNORMAL HIGH (ref 6–20)
CO2: 22 mmol/L (ref 22–32)
Calcium: 7.8 mg/dL — ABNORMAL LOW (ref 8.9–10.3)
Chloride: 97 mmol/L — ABNORMAL LOW (ref 101–111)
Creatinine, Ser: 12.73 mg/dL — ABNORMAL HIGH (ref 0.61–1.24)
GFR calc Af Amer: 4 mL/min — ABNORMAL LOW (ref >60)
GFR, EST NON AFRICAN AMERICAN: 4 mL/min — AB (ref >60)
Glucose, Bld: 122 mg/dL — ABNORMAL HIGH (ref 65–99)
PHOSPHORUS: 5.8 mg/dL — AB (ref 2.5–4.6)
POTASSIUM: 5.3 mmol/L — AB (ref 3.5–5.1)
Sodium: 135 mmol/L (ref 135–145)

## 2016-07-01 LAB — BASIC METABOLIC PANEL
Anion gap: 15 (ref 5–15)
BUN: 68 mg/dL — ABNORMAL HIGH (ref 6–20)
CALCIUM: 8.2 mg/dL — AB (ref 8.9–10.3)
CO2: 23 mmol/L (ref 22–32)
CREATININE: 12.17 mg/dL — AB (ref 0.61–1.24)
Chloride: 98 mmol/L — ABNORMAL LOW (ref 101–111)
GFR calc Af Amer: 4 mL/min — ABNORMAL LOW (ref >60)
GFR calc non Af Amer: 4 mL/min — ABNORMAL LOW (ref >60)
GLUCOSE: 117 mg/dL — AB (ref 65–99)
Potassium: 6.1 mmol/L — ABNORMAL HIGH (ref 3.5–5.1)
Sodium: 136 mmol/L (ref 135–145)

## 2016-07-01 LAB — CBG MONITORING, ED
GLUCOSE-CAPILLARY: 146 mg/dL — AB (ref 65–99)
Glucose-Capillary: 117 mg/dL — ABNORMAL HIGH (ref 65–99)
Glucose-Capillary: 133 mg/dL — ABNORMAL HIGH (ref 65–99)

## 2016-07-01 LAB — I-STAT TROPONIN, ED: TROPONIN I, POC: 0.01 ng/mL (ref 0.00–0.08)

## 2016-07-01 LAB — LIPASE, BLOOD: LIPASE: 37 U/L (ref 11–51)

## 2016-07-01 MED ORDER — HEPARIN SODIUM (PORCINE) 1000 UNIT/ML DIALYSIS: 20.0000 [IU]/kg | INTRAMUSCULAR | Status: DC | PRN

## 2016-07-01 MED ORDER — PENTAFLUOROPROP-TETRAFLUOROETH EX AERO: 1.0000 "application " | INHALATION_SPRAY | CUTANEOUS | Status: DC | PRN

## 2016-07-01 MED ORDER — SODIUM CHLORIDE 0.9 % IV SOLN: 100.0000 mL | INTRAVENOUS | Status: DC | PRN

## 2016-07-01 MED ORDER — LIDOCAINE HCL (PF) 1 % IJ SOLN: 5.0000 mL | INTRAMUSCULAR | Status: DC | PRN

## 2016-07-01 MED ORDER — ALTEPLASE 2 MG IJ SOLR: 2.0000 mg | Freq: Once | INTRAMUSCULAR | Status: DC | PRN

## 2016-07-01 MED ORDER — LIDOCAINE-PRILOCAINE 2.5-2.5 % EX CREA: 1.0000 "application " | TOPICAL_CREAM | CUTANEOUS | Status: DC | PRN

## 2016-07-01 MED ORDER — SODIUM CHLORIDE 0.9 % IV SOLN
1.0000 g | Freq: Once | INTRAVENOUS | Status: DC
  Filled 2016-07-01: qty 10

## 2016-07-01 MED ORDER — HEPARIN SODIUM (PORCINE) 1000 UNIT/ML DIALYSIS: 1000.0000 [IU] | INTRAMUSCULAR | Status: DC | PRN

## 2016-07-01 MED ORDER — DEXTROSE 10 % IV SOLN
Freq: Once | INTRAVENOUS | Status: AC
  Administered 2016-07-01: 16:00:00 via INTRAVENOUS

## 2016-07-01 MED ORDER — INSULIN ASPART 100 UNIT/ML ~~LOC~~ SOLN
SUBCUTANEOUS | Status: AC
  Administered 2016-07-01: 5 [IU] via INTRAVENOUS
  Filled 2016-07-01: qty 1

## 2016-07-01 MED ORDER — INSULIN ASPART 100 UNIT/ML IV SOLN: 10.0000 [IU] | Freq: Once | INTRAVENOUS | Status: DC

## 2016-07-01 MED ORDER — DEXTROSE 50 % IV SOLN
1.0000 | Freq: Once | INTRAVENOUS | Status: AC
  Administered 2016-07-01: 50 mL via INTRAVENOUS
  Filled 2016-07-01: qty 50

## 2016-07-01 MED ORDER — INSULIN ASPART 100 UNIT/ML IV SOLN
5.0000 [IU] | Freq: Once | INTRAVENOUS | Status: AC
  Administered 2016-07-01: 5 [IU] via INTRAVENOUS
  Filled 2016-07-01: qty 0.05

## 2016-07-01 MED ORDER — DOXERCALCIFEROL 4 MCG/2ML IV SOLN
2.0000 ug | Freq: Once | INTRAVENOUS | Status: AC
  Administered 2016-07-02: 2 ug via INTRAVENOUS
  Filled 2016-07-01: qty 2

## 2016-07-01 MED ORDER — HEPARIN SODIUM (PORCINE) 1000 UNIT/ML DIALYSIS: 100.0000 [IU]/kg | INTRAMUSCULAR | Status: DC | PRN

## 2016-07-01 NOTE — ED Notes (Addendum)
Unable to obtain labs. Phlebotomy called.

## 2016-07-01 NOTE — ED Provider Notes (Signed)
[facility] DEPT Provider Note   CSN: [id number] Arrival date & time: 07/01/16  [id number]     History   Chief Complaint Chief Complaint  Patient presents with  . Chest Pain    HPI Oscar Castillo is a 65 y.o. male.  Oscar Castillo is a 65 y.o. Male with a history of ESRD on dialysis who presents to the ED complaining of right sided chest pain and upper abdominal pain for 3 days. He reports the pain has been constant for the past three days and he is unable to identify alleviating or aggravating factors. Patient is on dialysis on a Tuesday, Thursday and Saturday schedule. He last had his full dialysis Saturday. He missed dialysis yesterday due to not feeling well. Patient has a bilateral below the knee amputation. He has a history of coronary disease and has never had stenting. He denies any nausea or vomiting. He does report decreased appetite over the past several days. Patient denies fevers, cough, shortness of breath, palpitations, worsening pain with exertion, nausea, vomiting, syncope, lightheadedness, diarrhea or rashes.    The history is provided by the patient and the spouse. No language interpreter was used.  Chest Pain   Associated symptoms include abdominal pain. Pertinent negatives include no back pain, no cough, no fever, no headaches, no nausea, no palpitations, no shortness of breath and no vomiting.    Past Medical History:  Diagnosis Date  . Anemia   . Antral ulcer 2014   small  . BACK PAIN, LUMBAR, CHRONIC   . BENIGN PROSTATIC HYPERTROPHY   . CEREBROVASCULAR ACCIDENT, HX OF   . CHOLELITHIASIS   . Chronic combined systolic and diastolic CHF (congestive heart failure) ([place])   . Complication of anesthesia    wife states pt had trouble waking up with his last surgery in Nov., 2014  . DEPRESSION   . DIABETES MELLITUS, TYPE II   . ERECTILE DYSFUNCTION   . ESRD on hemodialysis ([place])    ESRD due to DM/HTN. Started dialysis in November 2013.  HD TTS at [facility] on [address] and E.  [name] GERD   . Hemorrhoids   . HEPATITIS C, HX OF   . History of Clostridium difficile   . HYPERTENSION   . LBBB (left bundle branch block)   . Morbid obesity ([place])   . NEPHROLITHIASIS, HX OF   . PAF (paroxysmal atrial fibrillation) ([place])    a. Dx 12/2015.  . S/P bilateral BKA (below knee amputation) [facility])     Patient Active Problem List   Diagnosis Date Noted  . Hyperkalemia 07/01/2016  . New onset a-fib ([place]) 12/27/2015  . Glaucoma 12/27/2015  . LBBB (left bundle branch block)   . Rash 11/15/2015  . Cramping of hands 11/15/2015  . Liver fibrosis ([place]) 10/07/2015  . Diarrhea 08/14/2014  . Dehydration 10/02/2013  . Fever 10/02/2013  . Altered mental status 10/01/2013  . Encephalopathy, toxic 10/01/2013  . Encephalopathy, metabolic 99991111  . CVA (cerebral vascular accident) ([place]) 09/28/2013  . FTT (failure to thrive) in adult 09/28/2013  . Acute confusional state 09/28/2013  . Ulcer of sacral region, stage 3 ([place]) 09/26/2013  . S/P BKA (below knee amputation) bilateral ([place]) 09/08/2013  . ESRD on hemodialysis ([place]) 05/09/2013  . TIA (transient ischemic attack) 02/20/2013  . Acute blood loss anemia 10/28/2012  . Chronic combined systolic and diastolic congestive heart failure 10/28/2012  . Constipation 10/28/2012  . GI bleed 10/27/2012  . Mass in rectum 10/27/2012  .  DM (diabetes mellitus) type I controlled with renal manifestation ([place]) 09/08/2012  . LVH (left ventricular hypertrophy)-severe concentric 06/13/2012  . Anemia due to chronic illness 06/12/2012  . Hyperlipidemia 01/30/2011  . CHOLELITHIASIS 08/01/2010  . BENIGN PROSTATIC HYPERTROPHY 08/01/2010  . CEREBROVASCULAR ACCIDENT, HX OF 08/06/2009  . Depression 03/18/2009  . Acute combined systolic and diastolic heart failure, NYHA class 2-EF 45% 03/18/2009  . NEPHROLITHIASIS, HX OF 03/18/2009  . Morbid obesity ([place]) 03/25/2007  . Essential hypertension 03/25/2007  . GERD 03/25/2007  . Chronic  hepatitis C without hepatic coma ([place]) 03/25/2007    Past Surgical History:  Procedure Laterality Date  . AMPUTATION Left 05/12/2013   Procedure: AMPUTATION RAY;  Surgeon: Newt Minion, MD;  Location: [place];  Service: Orthopedics;  Laterality: Left;  Left Foot 1st Ray Amputation  . AMPUTATION Left 06/09/2013   Procedure: AMPUTATION BELOW KNEE;  Surgeon: Newt Minion, MD;  Location: [place];  Service: Orthopedics;  Laterality: Left;  Left Below Knee Amputation and removal proximal screws IM tibial nail  . AMPUTATION Right 09/08/2013   Procedure: AMPUTATION BELOW KNEE;  Surgeon: Newt Minion, MD;  Location: [place];  Service: Orthopedics;  Laterality: Right;  Right Below Knee Amputation  . AMPUTATION Right 10/11/2013   Procedure: AMPUTATION BELOW KNEE;  Surgeon: Newt Minion, MD;  Location: [place];  Service: Orthopedics;  Laterality: Right;  Right Below Knee Amputation Revision  . AV FISTULA PLACEMENT  06/14/2012   Procedure: ARTERIOVENOUS (AV) FISTULA CREATION;  Surgeon: Angelia Mould, MD;  [facility] OR;  Service: Vascular;  Laterality: Left;  Left basilic vein transposition with fistula.  . COLONOSCOPY N/A 10/28/2012   Procedure: COLONOSCOPY;  Surgeon: Jeryl Columbia, MD;  Location: [facility] ENDOSCOPY;  Service: Endoscopy;  Laterality: N/A;  . COLONOSCOPY N/A 11/02/2012   Procedure: COLONOSCOPY;  Surgeon: Cleotis Nipper, MD;  Location: [facility] ENDOSCOPY;  Service: Endoscopy;  Laterality: N/A;  . COLONOSCOPY N/A 11/03/2012   Procedure: COLONOSCOPY;  Surgeon: Cleotis Nipper, MD;  Location: [facility] ENDOSCOPY;  Service: Endoscopy;  Laterality: N/A;  . ENTEROSCOPY N/A 11/08/2012   Procedure: ENTEROSCOPY;  Surgeon: Wonda Horner, MD;  Location: [facility] ENDOSCOPY;  Service: Endoscopy;  Laterality: N/A;  . ESOPHAGOGASTRODUODENOSCOPY N/A 11/02/2012   Procedure: ESOPHAGOGASTRODUODENOSCOPY (EGD);  Surgeon: Cleotis Nipper, MD;  Location: [facility] ENDOSCOPY;  Service: Endoscopy;  Laterality: N/A;  . EYE SURGERY Left    to  remove scar tissue  . GIVENS CAPSULE STUDY N/A 11/04/2012   Procedure: GIVENS CAPSULE STUDY;  Surgeon: Cleotis Nipper, MD;  Location: [facility] ENDOSCOPY;  Service: Endoscopy;  Laterality: N/A;  . HARDWARE REMOVAL Left 06/09/2013   Procedure: HARDWARE REMOVAL;  Surgeon: Newt Minion, MD;  Location: [place];  Service: Orthopedics;  Laterality: Left;  Left Below Knee Amputation  and Removal proximal screws IM tibial nail  . NEPHRECTOMY     partial RR  . ORIF FIBULA FRACTURE Left 09/09/2012   Procedure: OPEN REDUCTION INTERNAL FIXATION (ORIF) FIBULA FRACTURE;  Surgeon: Johnny Bridge, MD;  Location: [place];  Service: Orthopedics;  Laterality: Left;  . TIBIA IM NAIL INSERTION Left 09/09/2012   Procedure: INTRAMEDULLARY (IM) NAIL TIBIAL;  Surgeon: Johnny Bridge, MD;  Location: [place];  Service: Orthopedics;  Laterality: Left;  left tibial nail and open reduction internal fixation left fibula fracture       Home Medications    Prior to Admission medications   Medication Sig Start Date End Date Taking? Authorizing Provider  acetaminophen (TYLENOL) 500 MG tablet Take 500 mg by mouth every 6 (six) hours as needed for mild pain.    Historical Provider, MD  atorvastatin (LIPITOR) 40 MG tablet TAKE 1 TABLET(40 MG) BY MOUTH DAILY AT 6 PM 06/22/16   [name], MD  calcium acetate (PHOSLO) 667 MG capsule Take 2,001 mg by mouth 3 (three) times daily with meals.    Historical Provider, MD  dicyclomine (BENTYL) 20 MG tablet Take one every 8 hours for abdominal cramps 06/16/16   Milton Ferguson, MD  dicyclomine (BENTYL) 20 MG tablet Take one every 8-12 hours for abdominal cramps 06/16/16   Milton Ferguson, MD  dicyclomine (BENTYL) 20 MG tablet Take one every 8-12 hours for abdominal cramps 06/16/16   Milton Ferguson, MD  diltiazem (CARDIZEM CD) 120 MG 24 hr capsule Take 1 capsule (120 mg total) by mouth daily. 01/09/16   [name], MD  hydrALAZINE (APRESOLINE) 100 MG tablet TAKE 1 TABLET BY MOUTH EVERY 8 HOURS  06/01/16   [name], MD  hydrOXYzine (ATARAX/VISTARIL) 25 MG tablet TAKE 1 TABLET(25 MG) BY MOUTH EVERY 6 HOURS AS NEEDED 06/01/16   [name], MD  ketoconazole (NIZORAL) 2 % cream Apply 1 application topically daily. 11/13/15   [name], MD  lidocaine-prilocaine (EMLA) cream APPLY A SMALL AMOUNT TO SKIN AT THE ACCESS SITE (AVF) AS DIRECTED BEFORE EACH DIALYSIS SESSION THREE DAYS A WEEK 06/03/15   Historical Provider, MD  metoprolol succinate (TOPROL-XL) 100 MG 24 hr tablet Take 1 tablet (100 mg total) by mouth daily. Take with or immediately following a meal. 01/09/16   [name], MD  ondansetron (ZOFRAN ODT) 4 MG disintegrating tablet 4mg  ODT q4 hours prn nausea/vomit 06/16/16   Milton Ferguson, MD  ondansetron (ZOFRAN ODT) 4 MG disintegrating tablet 4mg  ODT q4 hours prn nausea/vomit 06/16/16   Milton Ferguson, MD  ondansetron (ZOFRAN ODT) 4 MG disintegrating tablet 4mg  ODT q4 hours prn nausea/vomit 06/16/16   Milton Ferguson, MD  polyethylene glycol (MIRALAX / GLYCOLAX) packet Take 17 g by mouth 2 (two) times daily. 05/04/16   Merryl Hacker, MD  PREVIDENT 5000 PLUS 1.1 % CREA dental cream Place 1 application onto teeth every morning.  04/24/15   Historical Provider, MD  QUEtiapine (SEROQUEL) 25 MG tablet Take 1 tablet (25 mg total) by mouth 2 (two) times daily. 04/07/16   [name], MD  SENSIPAR 30 MG tablet Take 30 mg by mouth every evening.  01/09/15   Historical Provider, MD  silver sulfADIAZINE (SILVADENE) 1 % cream Apply 1 application topically daily. 06/05/16   Newt Minion, MD  traMADol (ULTRAM) 50 MG tablet Take 1 tablet (50 mg total) by mouth every 8 (eight) hours as needed. 06/01/16   [name], MD  VOLTAREN 1 % GEL APPLY 2 GRAMS TO HANDS BID TO TID 09/19/15   Historical Provider, MD  warfarin (COUMADIN) 1 MG tablet Take 1-1.5 mg by mouth See admin instructions. Take 1 and 1/2 tablets on Thursday, Friday and Saturday then take 1 tablet all the other days    Historical Provider, MD   warfarin (COUMADIN) 2.5 MG tablet TAKE 1 TABLET BY MOUTH AS DIRECTED Patient not taking: Reported on 06/16/2016 06/01/16   Fay Records, MD  warfarin (COUMADIN) 2.5 MG tablet Take 2.5-3.75 mg by mouth See admin instructions. 3.75 mg on Monday,Wednesday,    Historical Provider, MD  warfarin (COUMADIN) 2.5 MG tablet Take 2.5-3.75 mg by mouth daily.  Patient takes 3.75 mg on Monday,Wednesday,Thursday,Saturday all other days patient takes 2.5 mg    Historical Provider, MD    Family History Family History  Problem Relation Age of Onset  . Diabetes Mother   . Hypertension Mother   . Heart attack Father   . Hypertension Father   . Coronary artery disease Other     Social History Social History  Substance Use Topics  . Smoking status: Never Smoker  . Smokeless tobacco: Never Used  . Alcohol use No     Allergies   Patient has no known allergies.   Review of Systems Review of Systems  Constitutional: Negative for chills and fever.  HENT: Negative for congestion and sore throat.   Eyes: Negative for visual disturbance.  Respiratory: Negative for cough and shortness of breath.   Cardiovascular: Positive for chest pain. Negative for palpitations.  Gastrointestinal: Positive for abdominal pain. Negative for diarrhea, nausea and vomiting.  Genitourinary:       Does not make urine.   Musculoskeletal: Negative for back pain and neck pain.  Skin: Negative for rash.  Neurological: Negative for syncope, light-headedness and headaches.     Physical Exam Updated Vital Signs BP 137/92   Pulse 76   Temp 98.1 F (36.7 C) (Oral)   Resp 12   Ht 5\' 10"  (1.778 m)   Wt 102.1 kg   SpO2 100%   BMI 32.28 kg/m   Physical Exam  Constitutional: He appears well-developed and well-nourished. No distress.  HENT:  Head: Normocephalic and atraumatic.  Mouth/Throat: Oropharynx is clear and moist.  Eyes: Conjunctivae are normal. Pupils are equal, round, and reactive to light. Right eye exhibits  no discharge. Left eye exhibits no discharge.  Neck: Neck supple. No JVD present.  Cardiovascular: Normal rate, regular rhythm, normal heart sounds and intact distal pulses.   Pulmonary/Chest: Effort normal and breath sounds normal. No respiratory distress. He has no wheezes. He has no rales. He exhibits no tenderness.  Lungs are clear to auscultation bilaterally. No chest wall tenderness to palpation.  Abdominal: Soft. Bowel sounds are normal. He exhibits no mass. There is tenderness. There is no guarding.  Abdomen is soft. Bowel sounds are present. Patient has right upper quadrant tenderness to palpation of the positive Murphy sign. No other abdominal tenderness to palpation. No epigastric tenderness.  Musculoskeletal: He exhibits no edema.  Patient with bilateral BKA  Lymphadenopathy:    He has no cervical adenopathy.  Neurological: He is alert. Coordination normal.  Skin: Skin is warm and dry. Capillary refill takes less than 2 seconds. No rash noted. He is not diaphoretic. No erythema. No pallor.  Psychiatric: He has a normal mood and affect. His behavior is normal.  Nursing note and vitals reviewed.    ED Treatments / Results  Labs (all labs ordered are listed, but only abnormal results are displayed) Labs Reviewed  BASIC METABOLIC PANEL - Abnormal; Notable for the following:       Result Value   Potassium 6.1 (*)    Chloride 98 (*)    Glucose, Bld 117 (*)    BUN 68 (*)    Creatinine, Ser 12.17 (*)    Calcium 8.2 (*)    GFR calc non Af Amer 4 (*)    GFR calc Af Amer 4 (*)    All other components within normal limits  CBC - Abnormal; Notable for the following:    Hemoglobin 12.2 (*)    HCT 37.9 (*)  All other components within normal limits  PROTIME-INR - Abnormal; Notable for the following:    Prothrombin Time 22.1 (*)    All other components within normal limits  CBG MONITORING, ED - Abnormal; Notable for the following:    Glucose-Capillary 146 (*)    All other  components within normal limits  HEPATIC FUNCTION PANEL  LIPASE, BLOOD  GLUCOSE, RANDOM  PROTIME-INR  I-STAT TROPOININ, ED    EKG  EKG Interpretation  Date/Time:  Wednesday July 01 2016 09:57:58 EST Ventricular Rate:  72 PR Interval:  204 QRS Duration: 166 QT Interval:  490 QTC Calculation: 536 R Axis:   109 Text Interpretation:  Normal sinus rhythm Rightward axis Left bundle branch block Abnormal ECG similar to June 2017 Confirmed by Regenia Skeeter MD, [place] [phone]) on 07/01/2016 12:34:04 PM       Radiology Dg Chest 2 View  Result Date: 07/01/2016 CLINICAL DATA:  Chest pain for 3 days EXAM: CHEST  2 VIEW COMPARISON:  05/04/2016 FINDINGS: Cardiomediastinal silhouette is stable. No infiltrate or pleural effusion. No pulmonary edema. Linear atelectasis or scarring noted in lingula. Degenerative change thoracic spine. IMPRESSION: No infiltrate or pulmonary edema. Linear atelectasis or scarring in lingula. Electronically Signed   By: Lahoma Crocker M.D.   On: 07/01/2016 11:07    Procedures Procedures (including critical care time)  Medications Ordered in ED Medications  doxercalciferol (HECTOROL) injection 2 mcg (not administered)  dextrose 10 % infusion (not administered)  calcium gluconate 1 g in sodium chloride 0.9 % 100 mL IVPB (0 g Intravenous Hold 07/01/16 1444)  dextrose 50 % solution 50 mL (50 mLs Intravenous Given 07/01/16 1339)  insulin aspart (novoLOG) injection 5 Units (5 Units Intravenous Given 07/01/16 1436)     Initial Impression / Assessment and Plan / ED Course  I have reviewed the triage vital signs and the nursing notes.  Pertinent labs & imaging results that were available during my care of the patient were reviewed by me and considered in my medical decision making (see chart for details).  Clinical Course     This is a 65 y.o. Male with a history of ESRD on dialysis who presents to the ED complaining of right sided chest pain and upper abdominal pain for 3  days. He reports the pain has been constant for the past three days and he is unable to identify alleviating or aggravating factors. Patient is on dialysis on a Tuesday, Thursday and Saturday schedule. He last had his full dialysis Saturday. He missed dialysis yesterday due to not feeling well. Patient has a bilateral below the knee amputation. He has a history of coronary disease and has never had stenting. He denies any nausea or vomiting. He does report decreased appetite over the past several days. On exam the patient is afebrile nontoxic appearing. Lungs clear to auscultation bilaterally. No chest wall tenderness to palpation. His bilateral below the knee amputation. His has a right upper quadrant abdominal tenderness to palpation. No epigastric tenderness. No peritoneal signs. The patient tells me this is where his pain is. Patient seems really having abdominal pain not chest pain. Troponin is not elevated. I see no need for delta troponin as the patient has been having this pain constantly for 3 days and seems to be having abdominal pain not chest pain. EKG shows no significant change from his last tracing. Chest x-ray shows no infiltrate or pulmonary edema. It does show linear atelectasis or scarring in the lingula. BMP shows a potassium of 6.1.  Creatinine is 12.17 which is consistent with the patient in end-stage renal disease. Sodium is 136. CBC is remarkable only for a hemoglobin of 12.2. Patient given insulin and dextrose. For hyperkalemia.  Nephrology spoke with Dr. Regenia Skeeter and they will do dialysis today. We are concerned for gallbladder ultrasound. Will obtain US and he can go up to dialysis. If ultrasound is unremarkable patient can be discharged from dialysis. No need for emergent dialysis immediately. Will obtain prior to discharge.  At shift change hepatic function panel and abdominal ultrasound is pending. Plan for abdominal ultrasound and then dialysis. Patient care signed out to Darnelle Bos, MD who will disposition the patient accordingly.   This patient was discussed with and evaluated by Dr. Regenia Skeeter who agrees with assessment and plan.   Final Clinical Impressions(s) / ED Diagnoses   Final diagnoses:  RUQ abdominal pain  Hyperkalemia    CRITICAL CARE Performed by: Hanley Hays   Total critical care time: 45 minutes  Critical care time was exclusive of separately billable procedures and treating other patients.  Critical care was necessary to treat or prevent imminent or life-threatening deterioration.  Critical care was time spent personally by me on the following activities: development of treatment plan with patient and/or surrogate as well as nursing, discussions with consultants, evaluation of patient's response to treatment, examination of patient, obtaining history from patient or surrogate, ordering and performing treatments and interventions, ordering and review of laboratory studies, ordering and review of radiographic studies, pulse oximetry and re-evaluation of patient's condition.   New Prescriptions New Prescriptions   No medications on file     Waynetta Pean, PA-C 07/01/16 Marissa, MD 07/03/16 [phone]

## 2016-07-01 NOTE — ED Notes (Signed)
Security at bedside to help deescalate the arguments between wife and patient.

## 2016-07-01 NOTE — ED Notes (Signed)
Patient transported to Ultrasound 

## 2016-07-01 NOTE — ED Notes (Addendum)
Ultrasound notified that patient is ready for transport to ultrasound.

## 2016-07-01 NOTE — ED Notes (Addendum)
IV infiltrated while pushing dextrose 50%.  2 unsuccessful IV attempts after infiltration.  Charge RN to attempt IV access.

## 2016-07-01 NOTE — ED Notes (Signed)
Call wife when ready for discharge. [phone]

## 2016-07-01 NOTE — Telephone Encounter (Signed)
°  Patient Name: Oscar Castillo  DOB: 1951/01/17    Initial Comment Caller states, her husband has pain in right side for extreme pain. Trouble moving. Referred from the office. Verified    Nurse Assessment  Nurse: Julien Girt, RN, Almyra Free Date/Time Eilene Ghazi Time): 07/01/2016 8:36:00 AM  Confirm and document reason for call. If symptomatic, describe symptoms. ---Caller states her husband is having severe pain under his right arm pit. States the pain began 3-4 days ago. Denies chest pain or sob. States he "screams" with movement.  Does the patient have any new or worsening symptoms? ---Yes  Will a triage be completed? ---Yes  Related visit to physician within the last 2 weeks? ---No  Does the PT have any chronic conditions? (i.e. diabetes, asthma, etc.) ---Yes  List chronic conditions. ---Htn, Dialysis( kidney failure) , Bil BKA. A fib  Is this a behavioral health or substance abuse call? ---No     Guidelines    Guideline Title Affirmed Question Affirmed Notes  Abdominal Pain - Upper [1] Pain lasts > 10 minutes AND [2] age > 9 AND [3] at least one cardiac risk factor (i.e., hypertension, diabetes, obesity, smoker or strong family history of heart disease)    Final Disposition User   Go to ED Now Julien Girt, RN, [facility] - ED   Disagree/Comply: Comply

## 2016-07-01 NOTE — ED Triage Notes (Signed)
Pt reports constant right sided chest pain for the last 3 days. Pt last dialysis on saturday missed yesterday due to not feeling well.

## 2016-07-01 NOTE — ED Notes (Signed)
Dr. Tamala Julian at bedside to speak with the patient about the importance of hemodialysis. Pt has verbalized he is "tired of all this" and wants to go home. The risks have been discussed.

## 2016-07-01 NOTE — ED Notes (Signed)
Back from US.

## 2016-07-01 NOTE — ED Provider Notes (Signed)
Assumed care from Waynetta Pean, PA-C at 4pm. Please refer to his note for complete history, physical, and work up to this point.   Briefly, patient is a  48M with PMH ESRD on dialysis T/Th/Sat presenting with right sided chest pain and upper abdominal pain for 3 days. He missed dialysis yesterday. Labs significant for hyperkalemia (K 6.1). Patient has been temporized with insulin/glucose. Nephrology has been consulted and plan to dialyze when medically cleared. Troponin negative.   Plan is to f/u RUQ [place] to rule out acute cholecystitis. LFTs, coags, lipase pending. Abdominal ultrasound shows uncomplicated cholelithiasis. No signs of cholecystitis. Liver enzymes and lipase unremarkable. Coags unremarkable. Patient is cleared for dialysis.  Initially patient refused to go to dialysis stating he preferred to be discharged home. However after patient's wife refused to take him home and took his prosthetic legs he agreed to stay for dialysis. Once patient completes dialysis successfully he is stable for discharge home without further workup. He should report to dialysis center tomorrow for reevaluation.  Patient discussed with Dr. Sherry Ruffing, ED attending   Gibson Ramp, MD 07/02/16 [id number]    Gwenyth Allegra Tegeler, MD 07/02/16 2121

## 2016-07-01 NOTE — ED Provider Notes (Signed)
Patient is a 65 year old male with past history of end-stage renal disease on hemodialysis who presented with right-sided chest pain and upper abdominal pain.  Reviewed patient's ultrasound results. Patient has cholelithiasis without any signs of cholecystitis. Liver enzymes and lipase are normal. White blood cell count is not elevated.  Patient has already been discussed with nephrology and plan is for dialysis. Patient is now medically cleared for dialysis.  Patient discussed with Dr. Sherry Ruffing, ED attending.    Gibson Ramp, MD 07/01/16 2031    Gwenyth Allegra Tegeler, MD 07/02/16 2120

## 2016-07-02 DIAGNOSIS — D509 Iron deficiency anemia, unspecified: Secondary | ICD-10-CM | POA: Diagnosis not present

## 2016-07-02 DIAGNOSIS — D631 Anemia in chronic kidney disease: Secondary | ICD-10-CM | POA: Diagnosis not present

## 2016-07-02 DIAGNOSIS — E1122 Type 2 diabetes mellitus with diabetic chronic kidney disease: Secondary | ICD-10-CM | POA: Diagnosis not present

## 2016-07-02 DIAGNOSIS — N2581 Secondary hyperparathyroidism of renal origin: Secondary | ICD-10-CM | POA: Diagnosis not present

## 2016-07-02 DIAGNOSIS — N186 End stage renal disease: Secondary | ICD-10-CM | POA: Diagnosis not present

## 2016-07-02 MED ORDER — DOXERCALCIFEROL 4 MCG/2ML IV SOLN
INTRAVENOUS | Status: AC
  Administered 2016-07-02: 2 ug via INTRAVENOUS
  Filled 2016-07-02: qty 2

## 2016-07-03 ENCOUNTER — Telehealth: Payer: Self-pay

## 2016-07-03 MED ORDER — EPINEPHRINE PF 1 MG/10ML IJ SOSY
PREFILLED_SYRINGE | INTRAMUSCULAR | Status: AC
  Filled 2016-07-03: qty 10

## 2016-07-03 NOTE — Telephone Encounter (Signed)
Faxed request to [facility] for recent eye exam notes.

## 2016-07-04 DIAGNOSIS — D631 Anemia in chronic kidney disease: Secondary | ICD-10-CM | POA: Diagnosis not present

## 2016-07-04 DIAGNOSIS — N2581 Secondary hyperparathyroidism of renal origin: Secondary | ICD-10-CM | POA: Diagnosis not present

## 2016-07-04 DIAGNOSIS — D509 Iron deficiency anemia, unspecified: Secondary | ICD-10-CM | POA: Diagnosis not present

## 2016-07-04 DIAGNOSIS — E1122 Type 2 diabetes mellitus with diabetic chronic kidney disease: Secondary | ICD-10-CM | POA: Diagnosis not present

## 2016-07-04 DIAGNOSIS — N186 End stage renal disease: Secondary | ICD-10-CM | POA: Diagnosis not present

## 2016-07-05 ENCOUNTER — Other Ambulatory Visit: Payer: Self-pay | Admitting: Internal Medicine

## 2016-07-07 ENCOUNTER — Other Ambulatory Visit: Payer: Self-pay | Admitting: Internal Medicine

## 2016-07-07 DIAGNOSIS — N2581 Secondary hyperparathyroidism of renal origin: Secondary | ICD-10-CM | POA: Diagnosis not present

## 2016-07-07 DIAGNOSIS — D509 Iron deficiency anemia, unspecified: Secondary | ICD-10-CM | POA: Diagnosis not present

## 2016-07-07 DIAGNOSIS — E1122 Type 2 diabetes mellitus with diabetic chronic kidney disease: Secondary | ICD-10-CM | POA: Diagnosis not present

## 2016-07-07 DIAGNOSIS — D631 Anemia in chronic kidney disease: Secondary | ICD-10-CM | POA: Diagnosis not present

## 2016-07-07 DIAGNOSIS — N186 End stage renal disease: Secondary | ICD-10-CM | POA: Diagnosis not present

## 2016-07-08 ENCOUNTER — Encounter: Payer: Self-pay | Admitting: Internal Medicine

## 2016-07-08 ENCOUNTER — Ambulatory Visit (INDEPENDENT_AMBULATORY_CARE_PROVIDER_SITE_OTHER): Payer: Medicare Other | Admitting: Internal Medicine

## 2016-07-08 VITALS — BP 140/80 | HR 99 | Temp 98.3°F | Resp 20 | Wt 225.0 lb

## 2016-07-08 DIAGNOSIS — I639 Cerebral infarction, unspecified: Secondary | ICD-10-CM | POA: Diagnosis not present

## 2016-07-08 DIAGNOSIS — I10 Essential (primary) hypertension: Secondary | ICD-10-CM

## 2016-07-08 DIAGNOSIS — E1022 Type 1 diabetes mellitus with diabetic chronic kidney disease: Secondary | ICD-10-CM

## 2016-07-08 DIAGNOSIS — N184 Chronic kidney disease, stage 4 (severe): Secondary | ICD-10-CM

## 2016-07-08 DIAGNOSIS — R1011 Right upper quadrant pain: Secondary | ICD-10-CM

## 2016-07-08 MED ORDER — CYCLOBENZAPRINE HCL 5 MG PO TABS: 5.0000 mg | ORAL_TABLET | Freq: Three times a day (TID) | ORAL | 1 refills | Status: DC | PRN

## 2016-07-08 NOTE — Progress Notes (Signed)
Pre visit review using our clinic review tool, if applicable. No additional management support is needed unless otherwise documented below in the visit note. 

## 2016-07-08 NOTE — Patient Instructions (Signed)
Your A1c test today was OK  Please take all new medication as prescribed - the muscle relaxer as needed  Please continue all other medications as before, and refills have been done if requested.  Please have the pharmacy call with any other refills you may need.  Please continue your efforts at being more active, low cholesterol diabetic diet, and weight control.  Please keep your appointments with your specialists as you may have planned  Please return in 6 months, or sooner if needed

## 2016-07-08 NOTE — Progress Notes (Signed)
Subjective:    Patient ID: Oscar Castillo, male    DOB: 12/22/1950, 65 y.o.   MRN: [id number]  HPI  Here with c/o 2-3 days RUQ pain, mild, sharp, intermittent, not assoc with n/v/d, denies fever, chills, CP, sob, joint pain or rash.  Worse to twist to the left at the waist or bend forward. No radiation.  Abd u/s dec 6 d/w uncomplicated cholelithiasis.  LFT's normal.  WBC normal.  Pt was medically cleared for dialysis.  Pt denies chest pain, increased sob or doe, wheezing, orthopnea, PND, increased LE swelling, palpitations, dizziness or syncope.  Pt denies new neurological symptoms such as new headache, or facial or extremity weakness or numbness  Pt denies polydipsia, polyuria, or low sugar symptoms such as weakness or confusion improved with po intake.  Pt states overall good compliance with meds Past Medical History:  Diagnosis Date  . Anemia   . Antral ulcer 2014   small  . BACK PAIN, LUMBAR, CHRONIC   . BENIGN PROSTATIC HYPERTROPHY   . CEREBROVASCULAR ACCIDENT, HX OF   . CHOLELITHIASIS   . Chronic combined systolic and diastolic CHF (congestive heart failure) ([place])   . Complication of anesthesia    wife states pt had trouble waking up with his last surgery in Nov., 2014  . DEPRESSION   . DIABETES MELLITUS, TYPE II   . ERECTILE DYSFUNCTION   . ESRD on hemodialysis ([place])    ESRD due to DM/HTN. Started dialysis in November 2013.  HD TTS at [facility] on [address].  [name] GERD   . Hemorrhoids   . HEPATITIS C, HX OF   . History of Clostridium difficile   . HYPERTENSION   . LBBB (left bundle branch block)   . Morbid obesity ([place])   . NEPHROLITHIASIS, HX OF   . PAF (paroxysmal atrial fibrillation) ([place])    a. Dx 12/2015.  . S/P bilateral BKA (below knee amputation) [facility])    Past Surgical History:  Procedure Laterality Date  . AMPUTATION Left 05/12/2013   Procedure: AMPUTATION RAY;  Surgeon: Newt Minion, MD;  Location: [place];  Service: Orthopedics;  Laterality: Left;  Left Foot 1st  Ray Amputation  . AMPUTATION Left 06/09/2013   Procedure: AMPUTATION BELOW KNEE;  Surgeon: Newt Minion, MD;  Location: [place];  Service: Orthopedics;  Laterality: Left;  Left Below Knee Amputation and removal proximal screws IM tibial nail  . AMPUTATION Right 09/08/2013   Procedure: AMPUTATION BELOW KNEE;  Surgeon: Newt Minion, MD;  Location: [place];  Service: Orthopedics;  Laterality: Right;  Right Below Knee Amputation  . AMPUTATION Right 10/11/2013   Procedure: AMPUTATION BELOW KNEE;  Surgeon: Newt Minion, MD;  Location: [place];  Service: Orthopedics;  Laterality: Right;  Right Below Knee Amputation Revision  . AV FISTULA PLACEMENT  06/14/2012   Procedure: ARTERIOVENOUS (AV) FISTULA CREATION;  Surgeon: Angelia Mould, MD;  Location: [facility] OR;  Service: Vascular;  Laterality: Left;  Left basilic vein transposition with fistula.  . COLONOSCOPY N/A 10/28/2012   Procedure: COLONOSCOPY;  Surgeon: Jeryl Columbia, MD;  Location: [facility] END[facility]COPY;  Service: Endoscopy;  Laterality: N/A;  . COLONOSCOPY N/A 11/02/2012   Procedure: COLONOSCOPY;  Surgeon: Cleotis Nipper, MD;  Location: [facility] ENDOSCOPY;  Service: Endoscopy;  Laterality: N/A;  . COLONOSCOPY N/A 11/03/2012   Procedure: COLONOSCOPY;  Surgeon: Cleotis Nipper, MD;  Location: [facility] ENDOSCOPY;  Service: Endoscopy;  Laterality: N/A;  . ENTEROSCOPY N/A 11/08/2012  Procedure: ENTEROSCOPY;  Surgeon: Wonda Horner, MD;  Location: [facility] ENDOSCOPY;  Service: Endoscopy;  Laterality: N/A;  . ESOPHAGOGASTRODUODENOSCOPY N/A 11/02/2012   Procedure: ESOPHAGOGASTRODUODENOSCOPY (EGD);  Surgeon: Cleotis Nipper, MD;  Location: [facility] ENDOSCOPY;  Service: Endoscopy;  Laterality: N/A;  . EYE SURGERY Left    to remove scar tissue  . GIVENS CAPSULE STUDY N/A 11/04/2012   Procedure: GIVENS CAPSULE STUDY;  Surgeon: Cleotis Nipper, MD;  Location: [facility] ENDOSCOPY;  Service: Endoscopy;  Laterality: N/A;  . HARDWARE REMOVAL Left 06/09/2013   Procedure: HARDWARE REMOVAL;  Surgeon: Newt Minion, MD;  Location: [place];  Service: Orthopedics;  Laterality: Left;  Left Below Knee Amputation  and Removal proximal screws IM tibial nail  . NEPHRECTOMY     partial RR  . ORIF FIBULA FRACTURE Left 09/09/2012   Procedure: OPEN REDUCTION INTERNAL FIXATION (ORIF) FIBULA FRACTURE;  Surgeon: Johnny Bridge, MD;  Location: [place];  Service: Orthopedics;  Laterality: Left;  . TIBIA IM NAIL INSERTION Left 09/09/2012   Procedure: INTRAMEDULLARY (IM) NAIL TIBIAL;  Surgeon: Johnny Bridge, MD;  Location: [place];  Service: Orthopedics;  Laterality: Left;  left tibial nail and open reduction internal fixation left fibula fracture    reports that he has never smoked. He has never used smokeless tobacco. He reports that he does not drink alcohol or use drugs. family history includes Coronary artery disease in his other; Diabetes in his mother; Heart attack in his father; Hypertension in his father and mother. No Known Allergies Current Outpatient Prescriptions on File Prior to Visit  Medication Sig Dispense Refill  . acetaminophen (TYLENOL) 500 MG tablet Take 500 mg by mouth every 6 (six) hours as needed for mild pain.    [name] atorvastatin (LIPITOR) 40 MG tablet TAKE 1 TABLET(40 MG) BY MOUTH DAILY AT 6 PM 30 tablet 0  . calcium acetate (PHOSLO) 667 MG capsule Take 2,001 mg by mouth 3 (three) times daily with meals.    [name] diltiazem (CARDIZEM CD) 120 MG 24 hr capsule Take 1 capsule (120 mg total) by mouth daily. 90 capsule 3  . hydrALAZINE (APRESOLINE) 100 MG tablet TAKE 1 TABLET BY MOUTH EVERY 8 HOURS 90 tablet 1  . hydrOXYzine (ATARAX/VISTARIL) 25 MG tablet TAKE 1 TABLET(25 MG) BY MOUTH EVERY 6 HOURS AS NEEDED 40 tablet 0  . ketoconazole (NIZORAL) 2 % cream Apply 1 application topically daily. 30 g 1  . lidocaine-prilocaine (EMLA) cream APPLY A SMALL AMOUNT TO SKIN AT THE ACCESS SITE (AVF) AS DIRECTED BEFORE EACH DIALYSIS SESSION THREE DAYS A WEEK  6  . loperamide (IMODIUM) 2 MG capsule TAKE 1 CAPSULE BY MOUTH  AS NEEDED FOR DIARRHEA OR LOOSE STOOLS 30 capsule 0  . metoprolol succinate (TOPROL-XL) 100 MG 24 hr tablet Take 1 tablet (100 mg total) by mouth daily. Take with or immediately following a meal. 90 tablet 3  . ondansetron (ZOFRAN ODT) 4 MG disintegrating tablet 4mg  ODT q4 hours prn nausea/vomit 15 tablet 0  . polyethylene glycol (MIRALAX / GLYCOLAX) packet Take 17 g by mouth 2 (two) times daily. 14 each 0  . PREVIDENT 5000 PLUS 1.1 % CREA dental cream Place 1 application onto teeth every morning.   6  . QUEtiapine (SEROQUEL) 25 MG tablet Take 1 tablet (25 mg total) by mouth 2 (two) times daily. 60 tablet 5  . SENSIPAR 30 MG tablet Take 30 mg by mouth every evening.     . silver sulfADIAZINE (SILVADENE) 1 %  cream Apply 1 application topically daily. 50 g 0  . traMADol (ULTRAM) 50 MG tablet Take 1 tablet (50 mg total) by mouth every 8 (eight) hours as needed. 90 tablet 1  . VOLTAREN 1 % GEL APPLY 2 GRAMS TO HANDS BID TO TID  0  . warfarin (COUMADIN) 2.5 MG tablet TAKE 1 TABLET BY MOUTH AS DIRECTED 40 tablet 3   No current facility-administered medications on file prior to visit.    Review of Systems  Constitutional: Negative for unusual diaphoresis or night sweats HENT: Negative for ear swelling or discharge Eyes: Negative for worsening visual haziness  Respiratory: Negative for choking and stridor.   Gastrointestinal: Negative for distension or worsening eructation Genitourinary: Negative for retention or change in urine volume.  Musculoskeletal: Negative for other MSK pain or swelling Skin: Negative for color change and worsening wound Neurological: Negative for tremors and numbness other than noted  Psychiatric/Behavioral: Negative for decreased concentration or agitation other than above   All other system neg per pt    Objective:   Physical Exam BP 140/80   Pulse 99   Temp 98.3 F (36.8 C) (Oral)   Resp 20   Wt 225 lb (102.1 kg)   SpO2 90%   BMI 32.28 kg/m  VS noted,    Constitutional: Pt appears in no apparent distress HENT: Head: NCAT.  Right Ear: External ear normal.  Left Ear: External ear normal.  Eyes: . Pupils are equal, round, and reactive to light. Conjunctivae and EOM are normal Neck: Normal range of motion. Neck supple.  Cardiovascular: Normal rate and regular rhythm.   Pulmonary/Chest: Effort normal and breath sounds without rales or wheezing.  Abd:  Soft, ND, + BS with mild RUQ tender without guarding, rebound, no flank tender, no rash or swelling Neurological: Pt is alert. Not confused , motor grossly intact Skin: Skin is warm. No rash, no LE edema Psychiatric: Pt behavior is normal. No agitation.   POCT - A1c - 5.9    Assessment & Plan:

## 2016-07-09 ENCOUNTER — Ambulatory Visit (INDEPENDENT_AMBULATORY_CARE_PROVIDER_SITE_OTHER): Payer: Medicare Other | Admitting: *Deleted

## 2016-07-09 DIAGNOSIS — I6389 Other cerebral infarction: Secondary | ICD-10-CM

## 2016-07-09 DIAGNOSIS — I638 Other cerebral infarction: Secondary | ICD-10-CM

## 2016-07-09 DIAGNOSIS — I639 Cerebral infarction, unspecified: Secondary | ICD-10-CM

## 2016-07-09 DIAGNOSIS — N2581 Secondary hyperparathyroidism of renal origin: Secondary | ICD-10-CM | POA: Diagnosis not present

## 2016-07-09 DIAGNOSIS — D631 Anemia in chronic kidney disease: Secondary | ICD-10-CM | POA: Diagnosis not present

## 2016-07-09 DIAGNOSIS — N186 End stage renal disease: Secondary | ICD-10-CM | POA: Diagnosis not present

## 2016-07-09 DIAGNOSIS — E1122 Type 2 diabetes mellitus with diabetic chronic kidney disease: Secondary | ICD-10-CM | POA: Diagnosis not present

## 2016-07-09 DIAGNOSIS — D509 Iron deficiency anemia, unspecified: Secondary | ICD-10-CM | POA: Diagnosis not present

## 2016-07-09 DIAGNOSIS — I4891 Unspecified atrial fibrillation: Secondary | ICD-10-CM | POA: Diagnosis not present

## 2016-07-09 LAB — POCT INR: INR: 2.1

## 2016-07-11 DIAGNOSIS — N186 End stage renal disease: Secondary | ICD-10-CM | POA: Diagnosis not present

## 2016-07-11 DIAGNOSIS — D509 Iron deficiency anemia, unspecified: Secondary | ICD-10-CM | POA: Diagnosis not present

## 2016-07-11 DIAGNOSIS — D631 Anemia in chronic kidney disease: Secondary | ICD-10-CM | POA: Diagnosis not present

## 2016-07-11 DIAGNOSIS — N2581 Secondary hyperparathyroidism of renal origin: Secondary | ICD-10-CM | POA: Diagnosis not present

## 2016-07-11 DIAGNOSIS — E1122 Type 2 diabetes mellitus with diabetic chronic kidney disease: Secondary | ICD-10-CM | POA: Diagnosis not present

## 2016-07-12 DIAGNOSIS — R1011 Right upper quadrant pain: Secondary | ICD-10-CM | POA: Insufficient documentation

## 2016-07-12 NOTE — Assessment & Plan Note (Signed)
stable overall by history and exam, recent data reviewed with pt, and pt to continue medical treatment as before,  to f/u any worsening symptoms or concerns BP Readings from Last 3 Encounters:  07/08/16 140/80  07/02/16 136/74  06/16/16 152/91

## 2016-07-12 NOTE — Assessment & Plan Note (Signed)
stable overall by history and exam, recent data reviewed with pt, and pt to continue medical treatment as before,  to f/u any worsening symptoms or concerns  

## 2016-07-12 NOTE — Assessment & Plan Note (Addendum)
C/w MSK most likely, for muscle relaxer prn, doubt needs HIDA scan at this time,  to f/u any worsening symptoms or concerns

## 2016-07-15 DIAGNOSIS — Z992 Dependence on renal dialysis: Secondary | ICD-10-CM | POA: Diagnosis not present

## 2016-07-15 DIAGNOSIS — T82858A Stenosis of vascular prosthetic devices, implants and grafts, initial encounter: Secondary | ICD-10-CM | POA: Diagnosis not present

## 2016-07-15 DIAGNOSIS — I871 Compression of vein: Secondary | ICD-10-CM | POA: Diagnosis not present

## 2016-07-15 DIAGNOSIS — N186 End stage renal disease: Secondary | ICD-10-CM | POA: Diagnosis not present

## 2016-07-16 DIAGNOSIS — D631 Anemia in chronic kidney disease: Secondary | ICD-10-CM | POA: Diagnosis not present

## 2016-07-16 DIAGNOSIS — N186 End stage renal disease: Secondary | ICD-10-CM | POA: Diagnosis not present

## 2016-07-16 DIAGNOSIS — D509 Iron deficiency anemia, unspecified: Secondary | ICD-10-CM | POA: Diagnosis not present

## 2016-07-16 DIAGNOSIS — E1122 Type 2 diabetes mellitus with diabetic chronic kidney disease: Secondary | ICD-10-CM | POA: Diagnosis not present

## 2016-07-16 DIAGNOSIS — N2581 Secondary hyperparathyroidism of renal origin: Secondary | ICD-10-CM | POA: Diagnosis not present

## 2016-07-18 DIAGNOSIS — D631 Anemia in chronic kidney disease: Secondary | ICD-10-CM | POA: Diagnosis not present

## 2016-07-18 DIAGNOSIS — D509 Iron deficiency anemia, unspecified: Secondary | ICD-10-CM | POA: Diagnosis not present

## 2016-07-18 DIAGNOSIS — E1122 Type 2 diabetes mellitus with diabetic chronic kidney disease: Secondary | ICD-10-CM | POA: Diagnosis not present

## 2016-07-18 DIAGNOSIS — N186 End stage renal disease: Secondary | ICD-10-CM | POA: Diagnosis not present

## 2016-07-18 DIAGNOSIS — N2581 Secondary hyperparathyroidism of renal origin: Secondary | ICD-10-CM | POA: Diagnosis not present

## 2016-07-21 DIAGNOSIS — E1122 Type 2 diabetes mellitus with diabetic chronic kidney disease: Secondary | ICD-10-CM | POA: Diagnosis not present

## 2016-07-21 DIAGNOSIS — D631 Anemia in chronic kidney disease: Secondary | ICD-10-CM | POA: Diagnosis not present

## 2016-07-21 DIAGNOSIS — N2581 Secondary hyperparathyroidism of renal origin: Secondary | ICD-10-CM | POA: Diagnosis not present

## 2016-07-21 DIAGNOSIS — D509 Iron deficiency anemia, unspecified: Secondary | ICD-10-CM | POA: Diagnosis not present

## 2016-07-21 DIAGNOSIS — N186 End stage renal disease: Secondary | ICD-10-CM | POA: Diagnosis not present

## 2016-07-22 NOTE — Telephone Encounter (Signed)
error 

## 2016-07-23 DIAGNOSIS — N186 End stage renal disease: Secondary | ICD-10-CM | POA: Diagnosis not present

## 2016-07-23 DIAGNOSIS — E1122 Type 2 diabetes mellitus with diabetic chronic kidney disease: Secondary | ICD-10-CM | POA: Diagnosis not present

## 2016-07-23 DIAGNOSIS — N2581 Secondary hyperparathyroidism of renal origin: Secondary | ICD-10-CM | POA: Diagnosis not present

## 2016-07-23 DIAGNOSIS — I4891 Unspecified atrial fibrillation: Secondary | ICD-10-CM | POA: Diagnosis not present

## 2016-07-23 DIAGNOSIS — D631 Anemia in chronic kidney disease: Secondary | ICD-10-CM | POA: Diagnosis not present

## 2016-07-23 DIAGNOSIS — Z5181 Encounter for therapeutic drug level monitoring: Secondary | ICD-10-CM | POA: Diagnosis not present

## 2016-07-23 DIAGNOSIS — D509 Iron deficiency anemia, unspecified: Secondary | ICD-10-CM | POA: Diagnosis not present

## 2016-07-24 NOTE — Telephone Encounter (Signed)
Called and requested the eye exam notes.

## 2016-07-25 ENCOUNTER — Other Ambulatory Visit: Payer: Self-pay | Admitting: Internal Medicine

## 2016-07-25 DIAGNOSIS — N186 End stage renal disease: Secondary | ICD-10-CM | POA: Diagnosis not present

## 2016-07-25 DIAGNOSIS — D509 Iron deficiency anemia, unspecified: Secondary | ICD-10-CM | POA: Diagnosis not present

## 2016-07-25 DIAGNOSIS — D631 Anemia in chronic kidney disease: Secondary | ICD-10-CM | POA: Diagnosis not present

## 2016-07-25 DIAGNOSIS — E1122 Type 2 diabetes mellitus with diabetic chronic kidney disease: Secondary | ICD-10-CM | POA: Diagnosis not present

## 2016-07-25 DIAGNOSIS — N2581 Secondary hyperparathyroidism of renal origin: Secondary | ICD-10-CM | POA: Diagnosis not present

## 2016-07-26 DIAGNOSIS — Z992 Dependence on renal dialysis: Secondary | ICD-10-CM | POA: Diagnosis not present

## 2016-07-26 DIAGNOSIS — N186 End stage renal disease: Secondary | ICD-10-CM | POA: Diagnosis not present

## 2016-07-26 DIAGNOSIS — E1129 Type 2 diabetes mellitus with other diabetic kidney complication: Secondary | ICD-10-CM | POA: Diagnosis not present

## 2016-07-28 DIAGNOSIS — N186 End stage renal disease: Secondary | ICD-10-CM | POA: Diagnosis not present

## 2016-07-28 DIAGNOSIS — N2581 Secondary hyperparathyroidism of renal origin: Secondary | ICD-10-CM | POA: Diagnosis not present

## 2016-07-28 DIAGNOSIS — E1122 Type 2 diabetes mellitus with diabetic chronic kidney disease: Secondary | ICD-10-CM | POA: Diagnosis not present

## 2016-07-29 NOTE — Telephone Encounter (Signed)
Notes received and abstracted.  

## 2016-07-30 ENCOUNTER — Ambulatory Visit (INDEPENDENT_AMBULATORY_CARE_PROVIDER_SITE_OTHER): Payer: Medicare Other

## 2016-07-30 DIAGNOSIS — I638 Other cerebral infarction: Secondary | ICD-10-CM

## 2016-07-30 DIAGNOSIS — I4891 Unspecified atrial fibrillation: Secondary | ICD-10-CM | POA: Diagnosis not present

## 2016-07-30 DIAGNOSIS — N2581 Secondary hyperparathyroidism of renal origin: Secondary | ICD-10-CM | POA: Diagnosis not present

## 2016-07-30 DIAGNOSIS — N186 End stage renal disease: Secondary | ICD-10-CM | POA: Diagnosis not present

## 2016-07-30 DIAGNOSIS — E1122 Type 2 diabetes mellitus with diabetic chronic kidney disease: Secondary | ICD-10-CM | POA: Diagnosis not present

## 2016-07-30 DIAGNOSIS — I6389 Other cerebral infarction: Secondary | ICD-10-CM

## 2016-07-30 LAB — POCT INR: INR: 1.4

## 2016-07-31 ENCOUNTER — Telehealth: Payer: Self-pay | Admitting: Internal Medicine

## 2016-07-31 DIAGNOSIS — K6289 Other specified diseases of anus and rectum: Secondary | ICD-10-CM

## 2016-07-31 NOTE — Telephone Encounter (Signed)
Referral GI done

## 2016-07-31 NOTE — Telephone Encounter (Signed)
Patient and wife are aware.

## 2016-07-31 NOTE — Telephone Encounter (Signed)
Pt's wife called to say that pt's is still having pain in his rectum and it is progressively getting worse. She states you would send a referral in if it doesn't get better.  Please call them at [phone]

## 2016-08-01 DIAGNOSIS — N186 End stage renal disease: Secondary | ICD-10-CM | POA: Diagnosis not present

## 2016-08-01 DIAGNOSIS — E1122 Type 2 diabetes mellitus with diabetic chronic kidney disease: Secondary | ICD-10-CM | POA: Diagnosis not present

## 2016-08-01 DIAGNOSIS — N2581 Secondary hyperparathyroidism of renal origin: Secondary | ICD-10-CM | POA: Diagnosis not present

## 2016-08-04 ENCOUNTER — Encounter (HOSPITAL_COMMUNITY): Payer: Self-pay | Admitting: *Deleted

## 2016-08-04 DIAGNOSIS — Z79899 Other long term (current) drug therapy: Secondary | ICD-10-CM | POA: Diagnosis not present

## 2016-08-04 DIAGNOSIS — Z7901 Long term (current) use of anticoagulants: Secondary | ICD-10-CM | POA: Insufficient documentation

## 2016-08-04 DIAGNOSIS — G8929 Other chronic pain: Secondary | ICD-10-CM | POA: Insufficient documentation

## 2016-08-04 DIAGNOSIS — R197 Diarrhea, unspecified: Secondary | ICD-10-CM | POA: Insufficient documentation

## 2016-08-04 DIAGNOSIS — N186 End stage renal disease: Secondary | ICD-10-CM | POA: Diagnosis not present

## 2016-08-04 DIAGNOSIS — E1122 Type 2 diabetes mellitus with diabetic chronic kidney disease: Secondary | ICD-10-CM | POA: Diagnosis not present

## 2016-08-04 DIAGNOSIS — I5042 Chronic combined systolic (congestive) and diastolic (congestive) heart failure: Secondary | ICD-10-CM | POA: Diagnosis not present

## 2016-08-04 DIAGNOSIS — I132 Hypertensive heart and chronic kidney disease with heart failure and with stage 5 chronic kidney disease, or end stage renal disease: Secondary | ICD-10-CM | POA: Insufficient documentation

## 2016-08-04 DIAGNOSIS — Z8673 Personal history of transient ischemic attack (TIA), and cerebral infarction without residual deficits: Secondary | ICD-10-CM | POA: Insufficient documentation

## 2016-08-04 DIAGNOSIS — R109 Unspecified abdominal pain: Secondary | ICD-10-CM | POA: Diagnosis present

## 2016-08-04 DIAGNOSIS — R10817 Generalized abdominal tenderness: Secondary | ICD-10-CM | POA: Diagnosis not present

## 2016-08-04 DIAGNOSIS — N2581 Secondary hyperparathyroidism of renal origin: Secondary | ICD-10-CM | POA: Diagnosis not present

## 2016-08-04 DIAGNOSIS — R1084 Generalized abdominal pain: Secondary | ICD-10-CM | POA: Diagnosis not present

## 2016-08-04 LAB — CBC
HEMATOCRIT: 34.8 % — AB (ref 39.0–52.0)
Hemoglobin: 11.3 g/dL — ABNORMAL LOW (ref 13.0–17.0)
MCH: 29 pg (ref 26.0–34.0)
MCHC: 32.5 g/dL (ref 30.0–36.0)
MCV: 89.2 fL (ref 78.0–100.0)
PLATELETS: 267 10*3/uL (ref 150–400)
RBC: 3.9 MIL/uL — ABNORMAL LOW (ref 4.22–5.81)
RDW: 15.5 % (ref 11.5–15.5)
WBC: 7.2 10*3/uL (ref 4.0–10.5)

## 2016-08-04 NOTE — ED Triage Notes (Signed)
The pt is a dialysis pt that  Was dialyzed Tuesday fistula lt upper arm  He is having diarrhea for several days unsure if its chronic  Difficult to get a history from the pt

## 2016-08-05 ENCOUNTER — Emergency Department (HOSPITAL_COMMUNITY)
Admission: EM | Admit: 2016-08-05 | Discharge: 2016-08-05 | Disposition: A | Discharge status: Home / Self Care | Payer: Medicare Other | Attending: Emergency Medicine | Admitting: Emergency Medicine

## 2016-08-05 DIAGNOSIS — Z992 Dependence on renal dialysis: Secondary | ICD-10-CM | POA: Diagnosis not present

## 2016-08-05 DIAGNOSIS — R109 Unspecified abdominal pain: Secondary | ICD-10-CM

## 2016-08-05 DIAGNOSIS — I871 Compression of vein: Secondary | ICD-10-CM | POA: Diagnosis not present

## 2016-08-05 DIAGNOSIS — G8929 Other chronic pain: Secondary | ICD-10-CM

## 2016-08-05 DIAGNOSIS — R197 Diarrhea, unspecified: Secondary | ICD-10-CM

## 2016-08-05 DIAGNOSIS — T82858A Stenosis of vascular prosthetic devices, implants and grafts, initial encounter: Secondary | ICD-10-CM | POA: Diagnosis not present

## 2016-08-05 DIAGNOSIS — N186 End stage renal disease: Secondary | ICD-10-CM | POA: Diagnosis not present

## 2016-08-05 LAB — COMPREHENSIVE METABOLIC PANEL
ALBUMIN: 3.4 g/dL — AB (ref 3.5–5.0)
ALT: 9 U/L — ABNORMAL LOW (ref 17–63)
AST: 14 U/L — AB (ref 15–41)
Alkaline Phosphatase: 52 U/L (ref 38–126)
Anion gap: 12 (ref 5–15)
BILIRUBIN TOTAL: 0.3 mg/dL (ref 0.3–1.2)
BUN: 40 mg/dL — AB (ref 6–20)
CHLORIDE: 95 mmol/L — AB (ref 101–111)
CO2: 27 mmol/L (ref 22–32)
Calcium: 8.9 mg/dL (ref 8.9–10.3)
Creatinine, Ser: 7.54 mg/dL — ABNORMAL HIGH (ref 0.61–1.24)
GFR calc Af Amer: 8 mL/min — ABNORMAL LOW (ref >60)
GFR calc non Af Amer: 7 mL/min — ABNORMAL LOW (ref >60)
GLUCOSE: 146 mg/dL — AB (ref 65–99)
POTASSIUM: 4.3 mmol/L (ref 3.5–5.1)
SODIUM: 134 mmol/L — AB (ref 135–145)
Total Protein: 8.2 g/dL — ABNORMAL HIGH (ref 6.5–8.1)

## 2016-08-05 LAB — LIPASE, BLOOD: LIPASE: 44 U/L (ref 11–51)

## 2016-08-05 NOTE — ED Provider Notes (Signed)
[facility] DEPT Provider Note   CSN: [id number] Arrival date & time: 08/04/16  2314     History   Chief Complaint Chief Complaint  Patient presents with  . Abdominal Pain    HPI Oscar Castillo is a 66 y.o. male.  Patient with history of ESRD-HD (T, Th, Sat), HTN, CVA, CHF, T2DM, GERD, HEP C, presents with c/o abdominal pain and buttock pain. He states he has these symptoms "for a while". Patient is a difficult historian. No fever, vomiting, SOB, CP. He reports ongoing issues with diarrhea as well stating "I'm tired of having diarrhea tonight". Stools have been non-bloody.    The history is provided by the patient. No language interpreter was used.  Abdominal Pain   Associated symptoms include diarrhea. Pertinent negatives include fever, nausea and vomiting.    Past Medical History:  Diagnosis Date  . Anemia   . Antral ulcer 2014   small  . BACK PAIN, LUMBAR, CHRONIC   . BENIGN PROSTATIC HYPERTROPHY   . CEREBROVASCULAR ACCIDENT, HX OF   . CHOLELITHIASIS   . Chronic combined systolic and diastolic CHF (congestive heart failure) ([place])   . Complication of anesthesia    wife states pt had trouble waking up with his last surgery in Nov., 2014  . DEPRESSION   . DIABETES MELLITUS, TYPE II   . ERECTILE DYSFUNCTION   . ESRD on hemodialysis ([place])    ESRD due to DM/HTN. Started dialysis in November 2013.  HD TTS at [facility] on [address].  [name] GERD   . Hemorrhoids   . HEPATITIS C, HX OF   . History of Clostridium difficile   . HYPERTENSION   . LBBB (left bundle branch block)   . Morbid obesity ([place])   . NEPHROLITHIASIS, HX OF   . PAF (paroxysmal atrial fibrillation) ([place])    a. Dx 12/2015.  . S/P bilateral BKA (below knee amputation) [facility])     Patient Active Problem List   Diagnosis Date Noted  . RUQ pain 07/12/2016  . Hyperkalemia 07/01/2016  . New onset a-fib ([place]) 12/27/2015  . Glaucoma 12/27/2015  . LBBB (left bundle branch block)   . Rash 11/15/2015  .  Cramping of hands 11/15/2015  . Liver fibrosis ([place]) 10/07/2015  . Diarrhea 08/14/2014  . Dehydration 10/02/2013  . Fever 10/02/2013  . Altered mental status 10/01/2013  . Encephalopathy, toxic 10/01/2013  . Encephalopathy, metabolic 99991111  . CVA (cerebral vascular accident) ([place]) 09/28/2013  . FTT (failure to thrive) in adult 09/28/2013  . Acute confusional state 09/28/2013  . Ulcer of sacral region, stage 3 ([place]) 09/26/2013  . S/P BKA (below knee amputation) bilateral ([place]) 09/08/2013  . ESRD on hemodialysis ([place]) 05/09/2013  . TIA (transient ischemic attack) 02/20/2013  . Acute blood loss anemia 10/28/2012  . Chronic combined systolic and diastolic congestive heart failure 10/28/2012  . Constipation 10/28/2012  . GI bleed 10/27/2012  . Mass in rectum 10/27/2012  . DM (diabetes mellitus) type I controlled with renal manifestation ([place]) 09/08/2012  . LVH (left ventricular hypertrophy)-severe concentric 06/13/2012  . Anemia due to chronic illness 06/12/2012  . Hyperlipidemia 01/30/2011  . CHOLELITHIASIS 08/01/2010  . BENIGN PROSTATIC HYPERTROPHY 08/01/2010  . CEREBROVASCULAR ACCIDENT, HX OF 08/06/2009  . Depression 03/18/2009  . Acute combined systolic and diastolic heart failure, NYHA class 2-EF 45% 03/18/2009  . NEPHROLITHIASIS, HX OF 03/18/2009  . Morbid obesity ([place]) 03/25/2007  . Essential hypertension 03/25/2007  . GERD 03/25/2007  . Chronic hepatitis  C without hepatic coma ([place]) 03/25/2007    Past Surgical History:  Procedure Laterality Date  . AMPUTATION Left 05/12/2013   Procedure: AMPUTATION RAY;  Surgeon: Newt Minion, MD;  Location: [place];  Service: Orthopedics;  Laterality: Left;  Left Foot 1st Ray Amputation  . AMPUTATION Left 06/09/2013   Procedure: AMPUTATION BELOW KNEE;  Surgeon: Newt Minion, MD;  Location: [place];  Service: Orthopedics;  Laterality: Left;  Left Below Knee Amputation and removal proximal screws IM tibial nail  . AMPUTATION Right  09/08/2013   Procedure: AMPUTATION BELOW KNEE;  Surgeon: Newt Minion, MD;  Location: [place];  Service: Orthopedics;  Laterality: Right;  Right Below Knee Amputation  . AMPUTATION Right 10/11/2013   Procedure: AMPUTATION BELOW KNEE;  Surgeon: Newt Minion, MD;  Location: [place];  Service: Orthopedics;  Laterality: Right;  Right Below Knee Amputation Revision  . AV FISTULA PLACEMENT  06/14/2012   Procedure: ARTERIOVENOUS (AV) FISTULA CREATION;  Surgeon: Angelia Mould, MD;  Location: [facility] OR;  Service: Vascular;  Laterality: Left;  Left basilic vein transposition with fistula.  . COLONOSCOPY N/A 10/28/2012   Procedure: COLONOSCOPY;  Surgeon: Jeryl Columbia, MD;  Location: [facility] ENDOSCOPY;  Service: Endoscopy;  Laterality: N/A;  . COLONOSCOPY N/A 11/02/2012   Procedure: COLONOSCOPY;  Surgeon: Cleotis Nipper, MD;  Location: [facility] ENDOSCOPY;  Service: Endoscopy;  Laterality: N/A;  . COLONOSCOPY N/A 11/03/2012   Procedure: COLONOSCOPY;  Surgeon: Cleotis Nipper, MD;  Location: [facility] ENDOSCOPY;  Service: Endoscopy;  Laterality: N/A;  . ENTEROSCOPY N/A 11/08/2012   Procedure: ENTEROSCOPY;  Surgeon: Wonda Horner, MD;  Location: [facility] ENDOSCOPY;  Service: Endoscopy;  Laterality: N/A;  . ESOPHAGOGASTRODUODENOSCOPY N/A 11/02/2012   Procedure: ESOPHAGOGASTRODUODENOSCOPY (EGD);  Surgeon: Cleotis Nipper, MD;  Location: [facility] ENDOSCOPY;  Service: Endoscopy;  Laterality: N/A;  . EYE SURGERY Left    to remove scar tissue  . GIVENS CAPSULE STUDY N/A 11/04/2012   Procedure: GIVENS CAPSULE STUDY;  Surgeon: Cleotis Nipper, MD;  Location: [facility] ENDOSCOPY;  Service: Endoscopy;  Laterality: N/A;  . HARDWARE REMOVAL Left 06/09/2013   Procedure: HARDWARE REMOVAL;  Surgeon: Newt Minion, MD;  Location: [place];  Service: Orthopedics;  Laterality: Left;  Left Below Knee Amputation  and Removal proximal screws IM tibial nail  . NEPHRECTOMY     partial RR  . ORIF FIBULA FRACTURE Left 09/09/2012   Procedure: OPEN REDUCTION INTERNAL FIXATION  (ORIF) FIBULA FRACTURE;  Surgeon: Johnny Bridge, MD;  Location: [place];  Service: Orthopedics;  Laterality: Left;  . TIBIA IM NAIL INSERTION Left 09/09/2012   Procedure: INTRAMEDULLARY (IM) NAIL TIBIAL;  Surgeon: Johnny Bridge, MD;  Location: [place];  Service: Orthopedics;  Laterality: Left;  left tibial nail and open reduction internal fixation left fibula fracture       Home Medications    Prior to Admission medications   Medication Sig Start Date End Date Taking? Authorizing Provider  acetaminophen (TYLENOL) 500 MG tablet Take 500 mg by mouth every 6 (six) hours as needed for mild pain.    Historical Provider, MD  atorvastatin (LIPITOR) 40 MG tablet TAKE 1 TABLET(40 MG) BY MOUTH DAILY AT 6 PM 06/22/16   [name], MD  calcium acetate (PHOSLO) 667 MG capsule Take 2,001 mg by mouth 3 (three) times daily with meals.    Historical Provider, MD  cyclobenzaprine (FLEXERIL) 5 MG tablet Take 1 tablet (5 mg total) by mouth 3 (three) times daily  as needed for muscle spasms. 07/08/16   [name], MD  diltiazem (CARDIZEM CD) 120 MG 24 hr capsule Take 1 capsule (120 mg total) by mouth daily. 01/09/16   [name], MD  hydrALAZINE (APRESOLINE) 100 MG tablet TAKE 1 TABLET BY MOUTH EVERY 8 HOURS 07/07/16   [name], MD  hydrOXYzine (ATARAX/VISTARIL) 25 MG tablet TAKE 1 TABLET(25 MG) BY MOUTH EVERY 6 HOURS AS NEEDED 07/25/16   [name], MD  ketoconazole (NIZORAL) 2 % cream Apply 1 application topically daily. 11/13/15   [name], MD  lidocaine-prilocaine (EMLA) cream APPLY A SMALL AMOUNT TO SKIN AT THE ACCESS SITE (AVF) AS DIRECTED BEFORE EACH DIALYSIS SESSION THREE DAYS A WEEK 06/03/15   Historical Provider, MD  loperamide (IMODIUM) 2 MG capsule TAKE 1 CAPSULE BY MOUTH AS NEEDED FOR DIARRHEA OR LOOSE STOOLS 07/07/16   [name], MD  metoprolol succinate (TOPROL-XL) 100 MG 24 hr tablet Take 1 tablet (100 mg total) by mouth daily. Take with or immediately following a meal. 01/09/16   [name], MD  ondansetron (ZOFRAN ODT) 4 MG disintegrating tablet 4mg  ODT q4 hours prn nausea/vomit 06/16/16   Milton Ferguson, MD  polyethylene glycol [facility] / GLYCOLAX) packet Take 17 g by mouth 2 (two) times daily. 05/04/16   Merryl Hacker, MD  PREVIDENT 5000 PLUS 1.1 % CREA dental cream Place 1 application onto teeth every morning.  04/24/15   Historical Provider, MD  QUEtiapine (SEROQUEL) 25 MG tablet Take 1 tablet (25 mg total) by mouth 2 (two) times daily. 04/07/16   [name], MD  SENSIPAR 30 MG tablet Take 30 mg by mouth every evening.  01/09/15   Historical Provider, MD  silver sulfADIAZINE (SILVADENE) 1 % cream Apply 1 application topically daily. 06/05/16   Newt Minion, MD  traMADol (ULTRAM) 50 MG tablet Take 1 tablet (50 mg total) by mouth every 8 (eight) hours as needed. 06/01/16   [name], MD  VOLTAREN 1 % GEL APPLY 2 GRAMS TO HANDS BID TO TID 09/19/15   Historical Provider, MD  warfarin (COUMADIN) 2.5 MG tablet TAKE 1 TABLET BY MOUTH AS DIRECTED 06/01/16   Fay Records, MD    Family History Family History  Problem Relation Age of Onset  . Diabetes Mother   . Hypertension Mother   . Heart attack Father   . Hypertension Father   . Coronary artery disease Other     Social History Social History  Substance Use Topics  . Smoking status: Never Smoker  . Smokeless tobacco: Never Used  . Alcohol use No     Allergies   Patient has no known allergies.   Review of Systems Review of Systems  Constitutional: Negative for chills, diaphoresis and fever.  HENT: Negative.   Respiratory: Negative.   Cardiovascular: Negative.   Gastrointestinal: Positive for abdominal pain and diarrhea. Negative for nausea and vomiting.  Musculoskeletal: Negative.   Skin: Negative.   Neurological: Negative.      Physical Exam Updated Vital Signs BP 115/82   Pulse 84   Temp 99.1 F (37.3 C) (Oral)   Resp 16   Ht 5\' 10"  (1.778 m)   Wt 106.6 kg   SpO2 98%   BMI 33.72 kg/m    Physical Exam  Constitutional: He is oriented to person, place, and time. He appears well-developed and well-nourished.  HENT:  Head: Normocephalic.  Neck: Normal range of motion. Neck supple.  Cardiovascular: Normal rate and regular rhythm.   Pulmonary/Chest: Effort normal and breath sounds normal. He has no wheezes. He has no rales.  Abdominal: Soft. Bowel sounds are normal. There is tenderness (Generalized tenderness. ). There is no rebound and no guarding.  Musculoskeletal: Normal range of motion. He exhibits no edema.  Neurological: He is alert and oriented to person, place, and time.  Skin: Skin is warm and dry. No rash noted.  Psychiatric: He has a normal mood and affect.     ED Treatments / Results  Labs (all labs ordered are listed, but only abnormal results are displayed) Labs Reviewed  COMPREHENSIVE METABOLIC PANEL - Abnormal; Notable for the following:       Result Value   Sodium 134 (*)    Chloride 95 (*)    Glucose, Bld 146 (*)    BUN 40 (*)    Creatinine, Ser 7.54 (*)    Total Protein 8.2 (*)    Albumin 3.4 (*)    AST 14 (*)    ALT 9 (*)    GFR calc non Af Amer 7 (*)    GFR calc Af Amer 8 (*)    All other components within normal limits  CBC - Abnormal; Notable for the following:    RBC 3.90 (*)    Hemoglobin 11.3 (*)    HCT 34.8 (*)    All other components within normal limits  LIPASE, BLOOD    EKG  EKG Interpretation None       Radiology No results found.  Procedures Procedures (including critical care time)  Medications Ordered in ED Medications - No data to display   Initial Impression / Assessment and Plan / ED Course  I have reviewed the triage vital signs and the nursing notes.  Pertinent labs & imaging results that were available during my care of the patient were reviewed by me and considered in my medical decision making (see chart for details).  Clinical Course     Patient presents with complaint of abdominal pain and  diarrhea, both have been present for months. He is a difficult historian in that he does not provide details to symptoms easily. No mental status change. He is alert, oriented and appears in NAD.   Labs reassuring and at baseline. He does not appear to need emergent dialysis. Pain is not new and no acute process identified. He can be discharged home and is encouraged to follow up with his primary care physician.   Final Clinical Impressions(s) / ED Diagnoses   Final diagnoses:  None   1. Chronic abdominal pain 2. Diarrhea  New Prescriptions New Prescriptions   No medications on file     Charlann Lange, Hershal Coria 08/12/16 [place], MD 08/14/16 1432

## 2016-08-05 NOTE — ED Notes (Signed)
Pt c/o generalized abd "for a while." Reports getting tired of having diarrhea tonight. Had full dialysis treatment yesterday and is due to have graft "de-clotted" today

## 2016-08-05 NOTE — Discharge Instructions (Signed)
YOUR LAB STUDIES AND EXAM ARE NEGATIVE FOR NEW PROBLEM. YOU CAN BE DISCHARGED HOME AND WILL NEED TO FOLLOW UP WITH YOUR NEPHROLOGIST OR WITH PRIMARY CARE FOR FURTHER MANAGEMENT OF ONGOING ABDOMINAL AND BUTTOCK PAIN.

## 2016-08-06 DIAGNOSIS — N2581 Secondary hyperparathyroidism of renal origin: Secondary | ICD-10-CM | POA: Diagnosis not present

## 2016-08-06 DIAGNOSIS — E1122 Type 2 diabetes mellitus with diabetic chronic kidney disease: Secondary | ICD-10-CM | POA: Diagnosis not present

## 2016-08-06 DIAGNOSIS — N186 End stage renal disease: Secondary | ICD-10-CM | POA: Diagnosis not present

## 2016-08-08 DIAGNOSIS — N186 End stage renal disease: Secondary | ICD-10-CM | POA: Diagnosis not present

## 2016-08-08 DIAGNOSIS — E1122 Type 2 diabetes mellitus with diabetic chronic kidney disease: Secondary | ICD-10-CM | POA: Diagnosis not present

## 2016-08-08 DIAGNOSIS — N2581 Secondary hyperparathyroidism of renal origin: Secondary | ICD-10-CM | POA: Diagnosis not present

## 2016-08-10 ENCOUNTER — Ambulatory Visit (INDEPENDENT_AMBULATORY_CARE_PROVIDER_SITE_OTHER): Payer: Medicare Other

## 2016-08-10 ENCOUNTER — Other Ambulatory Visit: Payer: Self-pay | Admitting: Internal Medicine

## 2016-08-10 DIAGNOSIS — I638 Other cerebral infarction: Secondary | ICD-10-CM | POA: Diagnosis not present

## 2016-08-10 DIAGNOSIS — I6389 Other cerebral infarction: Secondary | ICD-10-CM

## 2016-08-10 DIAGNOSIS — G459 Transient cerebral ischemic attack, unspecified: Secondary | ICD-10-CM | POA: Diagnosis not present

## 2016-08-10 DIAGNOSIS — I4891 Unspecified atrial fibrillation: Secondary | ICD-10-CM | POA: Diagnosis not present

## 2016-08-10 LAB — POCT INR: INR: 2.1

## 2016-08-11 DIAGNOSIS — E1122 Type 2 diabetes mellitus with diabetic chronic kidney disease: Secondary | ICD-10-CM | POA: Diagnosis not present

## 2016-08-11 DIAGNOSIS — N186 End stage renal disease: Secondary | ICD-10-CM | POA: Diagnosis not present

## 2016-08-11 DIAGNOSIS — N2581 Secondary hyperparathyroidism of renal origin: Secondary | ICD-10-CM | POA: Diagnosis not present

## 2016-08-13 ENCOUNTER — Ambulatory Visit: Payer: Self-pay | Admitting: Physician Assistant

## 2016-08-13 DIAGNOSIS — N2581 Secondary hyperparathyroidism of renal origin: Secondary | ICD-10-CM | POA: Diagnosis not present

## 2016-08-13 DIAGNOSIS — E1122 Type 2 diabetes mellitus with diabetic chronic kidney disease: Secondary | ICD-10-CM | POA: Diagnosis not present

## 2016-08-13 DIAGNOSIS — N186 End stage renal disease: Secondary | ICD-10-CM | POA: Diagnosis not present

## 2016-08-15 DIAGNOSIS — N2581 Secondary hyperparathyroidism of renal origin: Secondary | ICD-10-CM | POA: Diagnosis not present

## 2016-08-15 DIAGNOSIS — E1122 Type 2 diabetes mellitus with diabetic chronic kidney disease: Secondary | ICD-10-CM | POA: Diagnosis not present

## 2016-08-15 DIAGNOSIS — N186 End stage renal disease: Secondary | ICD-10-CM | POA: Diagnosis not present

## 2016-08-18 DIAGNOSIS — N186 End stage renal disease: Secondary | ICD-10-CM | POA: Diagnosis not present

## 2016-08-18 DIAGNOSIS — E1122 Type 2 diabetes mellitus with diabetic chronic kidney disease: Secondary | ICD-10-CM | POA: Diagnosis not present

## 2016-08-18 DIAGNOSIS — N2581 Secondary hyperparathyroidism of renal origin: Secondary | ICD-10-CM | POA: Diagnosis not present

## 2016-08-20 ENCOUNTER — Encounter: Payer: Self-pay | Admitting: Physician Assistant

## 2016-08-20 ENCOUNTER — Telehealth: Payer: Self-pay | Admitting: Emergency Medicine

## 2016-08-20 ENCOUNTER — Other Ambulatory Visit (INDEPENDENT_AMBULATORY_CARE_PROVIDER_SITE_OTHER): Payer: Medicare Other

## 2016-08-20 ENCOUNTER — Ambulatory Visit (INDEPENDENT_AMBULATORY_CARE_PROVIDER_SITE_OTHER): Payer: Medicare Other | Admitting: Physician Assistant

## 2016-08-20 VITALS — BP 124/82 | Ht 70.0 in | Wt 235.0 lb

## 2016-08-20 DIAGNOSIS — K921 Melena: Secondary | ICD-10-CM | POA: Diagnosis not present

## 2016-08-20 DIAGNOSIS — N186 End stage renal disease: Secondary | ICD-10-CM | POA: Diagnosis not present

## 2016-08-20 DIAGNOSIS — E1122 Type 2 diabetes mellitus with diabetic chronic kidney disease: Secondary | ICD-10-CM | POA: Diagnosis not present

## 2016-08-20 DIAGNOSIS — R197 Diarrhea, unspecified: Secondary | ICD-10-CM

## 2016-08-20 DIAGNOSIS — I4891 Unspecified atrial fibrillation: Secondary | ICD-10-CM | POA: Diagnosis not present

## 2016-08-20 DIAGNOSIS — Z5181 Encounter for therapeutic drug level monitoring: Secondary | ICD-10-CM | POA: Diagnosis not present

## 2016-08-20 DIAGNOSIS — K6289 Other specified diseases of anus and rectum: Secondary | ICD-10-CM

## 2016-08-20 DIAGNOSIS — N2581 Secondary hyperparathyroidism of renal origin: Secondary | ICD-10-CM | POA: Diagnosis not present

## 2016-08-20 LAB — HEMOGLOBIN: HEMOGLOBIN: 12.7 g/dL — AB (ref 13.0–17.0)

## 2016-08-20 MED ORDER — NA SULFATE-K SULFATE-MG SULF 17.5-3.13-1.6 GM/177ML PO SOLN: 1.0000 | ORAL | 0 refills | Status: DC

## 2016-08-20 NOTE — Telephone Encounter (Signed)
08/20/2016   RE: Oscar Castillo DOB: [birth year] MRN: [id number]   Dear Dr. Harrington Challenger,    We have scheduled the above patient for an endoscopic procedure. Our records show that he is on anticoagulation therapy.   Please advise as to how long the patient may come off his therapy of Coumadin prior to the procedure, which is scheduled for 09-15-16.  Please fax back/ or route the completed form to Tinnie Gens, [place].   Sincerely,    Tinnie Gens, [place]

## 2016-08-20 NOTE — Patient Instructions (Signed)
Your physician has requested that you go to the basement for lab work before leaving today.  You have been scheduled for a colonoscopy. Please follow written instructions given to you at your visit today.  Please pick up your prep supplies at the pharmacy within the next 1-3 days. If you use inhalers (even only as needed), please bring them with you on the day of your procedure. Your physician has requested that you go to www.startemmi.com and enter the access code given to you at your visit today. This web site gives a general overview about your procedure. However, you should still follow specific instructions given to you by our office regarding your preparation for the procedure.   

## 2016-08-20 NOTE — Progress Notes (Addendum)
Chief Complaint: Rectal pain, diarrhea, abdominal pain   HPI:  Oscar Castillo is a 66 year old African-American male with a past medical history of ESRD on dialysis, peripheral vascular disease status post bilateral BKA, history of CHF, CVA, A. fib on chronic anticoagulation with Coumadin, bacteremia and prolonged hospitalization in 2015, history of PEG placement 2015 since removed, history of C. difficile, hypertension and diabetes who was referred to me by [name], MD for a complaint of rectal pain, diarrhea and abdominal pain .     Per chart review patient was recently seen in the ED on 08/05/16 for the same complaints. At that time he had CBC showing a mild anemia with hemoglobin of 11.3, normal lipase and CMP showing mild hyponatremia at 134 as well as a chloride low at 95 and a creatinine of 7.54. He was discharged without any additional medications.   Prior to this patient had an abdominal ultrasound and 12/60/17 which showed uncomplicated cholelithiasis and medical renal disease of the kidneys with loss of cortical medullary distinction as well as bilateral renal cysts   Patient has previously followed with Dr. Hilarie Fredrickson and was last seen on 04/02/15 for diarrhea. At that time his history of C. difficile was discussed and he was retested for this. This was negative and the patient never followed up per our notes.    Patient's last colonoscopy was on 11/03/12 by Dr. Cristina Gong for hematochezia of unclear cause during a hospitalization, there were signs of an active GI bleed most likely from his ileal cecal valve. No gross colonic abnormalities identified apart from blood throughout the colonic lumen.   Today, the patient presents to clinic accompanied by his wife who is his caretaker. It should be noted that they are poor historians. They described the patient has continued with at least 80% of his stools being loose with the occasional solid stool. His wife tells me that over the past month they have  changed to a dark color, like green/black. The patient believes this is because "I eat so many M&Ms". He does deny any epigastric pain, heartburn, reflux, nausea or vomiting.   Patient's main complaint today is of rectal pain. He tells me that this typically occurs when he tries to go to sleep at night. He tells me this can be sharp and like a dull ache all at the same time. He asked ask his wife to move him in multiple different directions in order to get comfortable at night, but sometimes this pain never goes away. His wife tells me that he "screams out in pain every night". Apparently he has tried some "creams and suppositories", but "I don't like to mess around down there too much". Patient does deny any bright red blood in his stools. He denies any acute weight loss.   Denies fever, chills, fatigue, anorexia or increase in gas or bloating.   Past Medical History:  Diagnosis Date  . Anemia   . Antral ulcer 2014   small  . BACK PAIN, LUMBAR, CHRONIC   . BENIGN PROSTATIC HYPERTROPHY   . CEREBROVASCULAR ACCIDENT, HX OF   . CHOLELITHIASIS   . Chronic combined systolic and diastolic CHF (congestive heart failure) ([place])   . Complication of anesthesia    wife states pt had trouble waking up with his last surgery in Nov., 2014  . DEPRESSION   . DIABETES MELLITUS, TYPE II   . ERECTILE DYSFUNCTION   . ESRD on hemodialysis ([place])    ESRD due to DM/HTN.  Started dialysis in November 2013.  HD TTS at [facility] on [address].  [name] GERD   . Hemorrhoids   . HEPATITIS C, HX OF   . History of Clostridium difficile   . HYPERTENSION   . LBBB (left bundle branch block)   . Morbid obesity ([place])   . NEPHROLITHIASIS, HX OF   . PAF (paroxysmal atrial fibrillation) ([place])    a. Dx 12/2015.  . S/P bilateral BKA (below knee amputation) [facility])     Past Surgical History:  Procedure Laterality Date  . AMPUTATION Left 05/12/2013   Procedure: AMPUTATION RAY;  Surgeon: Newt Minion, MD;  Location: [place];   Service: Orthopedics;  Laterality: Left;  Left Foot 1st Ray Amputation  . AMPUTATION Left 06/09/2013   Procedure: AMPUTATION BELOW KNEE;  Surgeon: Newt Minion, MD;  Location: [place];  Service: Orthopedics;  Laterality: Left;  Left Below Knee Amputation and removal proximal screws IM tibial nail  . AMPUTATION Right 09/08/2013   Procedure: AMPUTATION BELOW KNEE;  Surgeon: Newt Minion, MD;  Location: [place];  Service: Orthopedics;  Laterality: Right;  Right Below Knee Amputation  . AMPUTATION Right 10/11/2013   Procedure: AMPUTATION BELOW KNEE;  Surgeon: Newt Minion, MD;  Location: [place];  Service: Orthopedics;  Laterality: Right;  Right Below Knee Amputation Revision  . AV FISTULA PLACEMENT  06/14/2012   Procedure: ARTERIOVENOUS (AV) FISTULA CREATION;  Surgeon: Angelia Mould, MD;  Location: [facility] OR;  Service: Vascular;  Laterality: Left;  Left basilic vein transposition with fistula.  . COLONOSCOPY N/A 10/28/2012   Procedure: COLONOSCOPY;  Surgeon: Jeryl Columbia, MD;  Location: [facility] ENDOSCOPY;  Service: Endoscopy;  Laterality: N/A;  . COLONOSCOPY N/A 11/02/2012   Procedure: COLONOSCOPY;  Surgeon: Cleotis Nipper, MD;  Location: [facility] ENDOSCOPY;  Service: Endoscopy;  Laterality: N/A;  . COLONOSCOPY N/A 11/03/2012   Procedure: COLONOSCOPY;  Surgeon: Cleotis Nipper, MD;  Location: [facility] ENDOSCOPY;  Service: Endoscopy;  Laterality: N/A;  . ENTEROSCOPY N/A 11/08/2012   Procedure: ENTEROSCOPY;  Surgeon: Wonda Horner, MD;  Location: [facility] ENDOSCOPY;  Service: Endoscopy;  Laterality: N/A;  . ESOPHAGOGASTRODUODENOSCOPY N/A 11/02/2012   Procedure: ESOPHAGOGASTRODUODENOSCOPY (EGD);  Surgeon: Cleotis Nipper, MD;  Location: [facility] ENDOSCOPY;  Service: Endoscopy;  Laterality: N/A;  . EYE SURGERY Left    to remove scar tissue  . GIVENS CAPSULE STUDY N/A 11/04/2012   Procedure: GIVENS CAPSULE STUDY;  Surgeon: Cleotis Nipper, MD;  Location: [facility] ENDOSCOPY;  Service: Endoscopy;  Laterality: N/A;  . HARDWARE REMOVAL Left  06/09/2013   Procedure: HARDWARE REMOVAL;  Surgeon: Newt Minion, MD;  Location: [place];  Service: Orthopedics;  Laterality: Left;  Left Below Knee Amputation  and Removal proximal screws IM tibial nail  . NEPHRECTOMY     partial RR  . ORIF FIBULA FRACTURE Left 09/09/2012   Procedure: OPEN REDUCTION INTERNAL FIXATION (ORIF) FIBULA FRACTURE;  Surgeon: Johnny Bridge, MD;  Location: [place];  Service: Orthopedics;  Laterality: Left;  . TIBIA IM NAIL INSERTION Left 09/09/2012   Procedure: INTRAMEDULLARY (IM) NAIL TIBIAL;  Surgeon: Johnny Bridge, MD;  Location: [place];  Service: Orthopedics;  Laterality: Left;  left tibial nail and open reduction internal fixation left fibula fracture    Current Outpatient Prescriptions  Medication Sig Dispense Refill  . acetaminophen (TYLENOL) 500 MG tablet Take 500 mg by mouth every 6 (six) hours as needed for mild pain.    [name] atorvastatin (LIPITOR) 40  MG tablet TAKE 1 TABLET(40 MG) BY MOUTH DAILY AT 6 PM 30 tablet 3  . calcium acetate (PHOSLO) 667 MG capsule Take 2,001 mg by mouth 3 (three) times daily with meals.    . cyclobenzaprine (FLEXERIL) 5 MG tablet Take 1 tablet (5 mg total) by mouth 3 (three) times daily as needed for muscle spasms. 60 tablet 1  . diltiazem (CARDIZEM CD) 120 MG 24 hr capsule Take 1 capsule (120 mg total) by mouth daily. 90 capsule 3  . hydrALAZINE (APRESOLINE) 100 MG tablet TAKE 1 TABLET BY MOUTH EVERY 8 HOURS 90 tablet 1  . hydrOXYzine (ATARAX/VISTARIL) 25 MG tablet TAKE 1 TABLET(25 MG) BY MOUTH EVERY 6 HOURS AS NEEDED (Patient taking differently: TAKE 1 TABLET(25 MG) BY MOUTH EVERY 6 HOURS AS NEEDED FOR ANXIETY) 40 tablet 0  . ketoconazole (NIZORAL) 2 % cream Apply 1 application topically daily. (Patient taking differently: Apply 1 application topically daily as needed for irritation. ) 30 g 1  . lidocaine-prilocaine (EMLA) cream APPLY A SMALL AMOUNT TO SKIN AT THE ACCESS SITE (AVF) AS DIRECTED BEFORE EACH DIALYSIS SESSION THREE DAYS A  WEEK  6  . loperamide (IMODIUM) 2 MG capsule TAKE 1 CAPSULE BY MOUTH AS NEEDED FOR DIARRHEA OR LOOSE STOOLS 30 capsule 0  . metoprolol succinate (TOPROL-XL) 100 MG 24 hr tablet Take 1 tablet (100 mg total) by mouth daily. Take with or immediately following a meal. 90 tablet 3  . ondansetron (ZOFRAN ODT) 4 MG disintegrating tablet 4mg  ODT q4 hours prn nausea/vomit 15 tablet 0  . polyethylene glycol (MIRALAX / GLYCOLAX) packet Take 17 g by mouth 2 (two) times daily. (Patient taking differently: Take 17 g by mouth daily as needed for mild constipation. ) 14 each 0  . PREVIDENT 5000 PLUS 1.1 % CREA dental cream Place 1 application onto teeth every morning.   6  . QUEtiapine (SEROQUEL) 25 MG tablet Take 1 tablet (25 mg total) by mouth 2 (two) times daily. 60 tablet 5  . SENSIPAR 30 MG tablet Take 30 mg by mouth every evening.     . silver sulfADIAZINE (SILVADENE) 1 % cream Apply 1 application topically daily. (Patient taking differently: Apply 1 application topically daily as needed (irratation). ) 50 g 0  . traMADol (ULTRAM) 50 MG tablet Take 1 tablet (50 mg total) by mouth every 8 (eight) hours as needed. (Patient taking differently: Take 50 mg by mouth every 8 (eight) hours as needed for moderate pain. ) 90 tablet 1  . VOLTAREN 1 % GEL APPLY 2 GRAMS TO HANDS BID TO TID AS NEEDED FOR PAIN.  0  . warfarin (COUMADIN) 2.5 MG tablet TAKE 1 TABLET BY MOUTH AS DIRECTED (Patient taking differently: TAKE 1.5 TABLET EVERYDAY EXCEPT SUNDAY TAKE 1 TABLET.) 40 tablet 3   No current facility-administered medications for this visit.     Allergies as of 08/20/2016  . (No Known Allergies)    Family History  Problem Relation Age of Onset  . Diabetes Mother   . Hypertension Mother   . Heart attack Father   . Hypertension Father   . Coronary artery disease Other     Social History   Social History  . Marital status: Married    Spouse name: N/A  . Number of children: 2  . Years of education: 16    Occupational History  . disabled due to stroke Retired   Social History Main Topics  . Smoking status: Never Smoker  . Smokeless tobacco: Never  Used  . Alcohol use No  . Drug use: No  . Sexual activity: Yes    Birth control/ protection: None   Other Topics Concern  . 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   No local family, lives alone    Review of Systems:    Constitutional: No weight loss, fever or chills Skin: No rash  Cardiovascular: No chest pain Respiratory: No SOB  Gastrointestinal: See HPI and otherwise negative Genitourinary: No dysuria or change in urinary frequency Neurological: No headache, dizziness or syncope Musculoskeletal: No new muscle or joint pain Hematologic: No bruising Psychiatric: No history of depression or anxiety   Physical Exam:  Vital signs: BP 124/82   Ht 5\' 10"  (1.778 m)   Wt 235 lb (106.6 kg)   BMI 33.72 kg/m    Constitutional:  African-American male appears to be in NAD, Well developed, Well nourished, alert and cooperative Head:  Normocephalic and atraumatic. Eyes:   PEERL, EOMI. No icterus. Conjunctiva pink. Ears:  Normal auditory acuity. Neck:  Supple Throat: Oral cavity and pharynx without inflammation, swelling or lesion.  Respiratory: Respirations even and unlabored. Lungs clear to auscultation bilaterally.   No wheezes, crackles, or rhonchi.  Cardiovascular: Normal S1, S2. No MRG. Regular rate and rhythm. No peripheral edema, cyanosis or pallor.  Gastrointestinal:  Soft, nondistended, nontender. No rebound or guarding. Normal bowel sounds. No appreciable masses or hepatomegaly. Rectal:  External exam: Black chalky looking stool, hard to clean his rectum; internal exam: Tenderness to palpation generalized, no clear evidence of fissure, though visualization was poor due to stool even after cleaning Msk:  Bilateral BKA Neurologic:  Alert and oriented x4;  grossly normal  neurologically.  Skin:   Dry and intact without significant lesions or rashes. Psychiatric: Demonstrates good judgement and reason without abnormal affect or behaviors.  RELEVANT LABS AND IMAGING: CBC    Component Value Date/Time   WBC 7.2 08/04/2016 2335   RBC 3.90 (L) 08/04/2016 2335   HGB 11.3 (L) 08/04/2016 2335   HCT 34.8 (L) 08/04/2016 2335   PLT 267 08/04/2016 2335   MCV 89.2 08/04/2016 2335   MCH 29.0 08/04/2016 2335   MCHC 32.5 08/04/2016 2335   RDW 15.5 08/04/2016 2335   LYMPHSABS 1.4 06/16/2016 1045   MONOABS 0.6 06/16/2016 1045   EOSABS 0.1 06/16/2016 1045   BASOSABS 0.0 06/16/2016 1045    CMP     Component Value Date/Time   NA 134 (L) 08/04/2016 2335   K 4.3 08/04/2016 2335   CL 95 (L) 08/04/2016 2335   CO2 27 08/04/2016 2335   GLUCOSE 146 (H) 08/04/2016 2335   BUN 40 (H) 08/04/2016 2335   CREATININE 7.54 (H) 08/04/2016 2335   CREATININE 8.97 (H) 10/07/2015 1500   CALCIUM 8.9 08/04/2016 2335   PROT 8.2 (H) 08/04/2016 2335   ALBUMIN 3.4 (L) 08/04/2016 2335   AST 14 (L) 08/04/2016 2335   ALT 9 (L) 08/04/2016 2335   ALKPHOS 52 08/04/2016 2335   BILITOT 0.3 08/04/2016 2335   GFRNONAA 7 (L) 08/04/2016 2335   GFRNONAA 6 (L) 10/07/2015 1500   GFRAA 8 (L) 08/04/2016 2335   GFRAA 6 (L) 10/07/2015 1500   ABDOMEN ULTRASOUND COMPLETE  COMPARISON:  08/14/2015 ultrasound  FINDINGS: Gallbladder: Uncomplicated cholelithiasis. There is a 2.4 cm gallstone noted without secondary signs of acute cholecystitis. No wall thickening nor pericholecystic fluid is seen. The gallbladder wall is approximately 2.5 mm  in thickness. No sonographic Murphy sign was elicited by the technologist.  Common bile duct: Diameter: Normal at 4.4 mm  Liver: No focal lesion identified. Within normal limits in parenchymal echogenicity.  IVC: No abnormality visualized.  Pancreas: Suboptimally visualized.  Spleen: Size and appearance within normal limits. The spleen measures  approximately 9.6 x 4.4 cm.  Right Kidney: Length: 8.4 cm. Atrophy with slight cortical thinning and some loss of cortical -medullary distinction. No nephrolithiasis. There is an anechoic simple cyst measuring 1.5 x 1.3 x 1.4 cm in the lower pole. No nephrolithiasis. No obstructive uropathy.  Left Kidney: Length: 9.2 cm. There is loss of cortical -medullary distinction. There is a posterior interpolar 1.2 x 1 x 1.1 cm cyst with low level internal echoes likely representing a small complex cyst without worrisome features noted. No nephrolithiasis or obstructive uropathy.  Abdominal aorta: No aneurysm visualized.  Other findings: The portal venous and hepatic veins are patent.  IMPRESSION: Uncomplicated cholelithiasis.  Medical-renal disease of the kidneys with loss cortical-medullary distinction. Bilateral renal cysts as above described. No obstructive uropathy or nephrolithiasis.   Electronically Signed   By: Ashley Royalty M.D.   On: 07/01/2016 20:12  Assessment: 1. Diarrhea: Chronic for the patient, 80% of his stools are loose, now with change in color towards dark/black, question melena, patient was slightly anemic at time of last blood draw as above; question microscopic colitis versus IBS versus other 2. Change in stool color: As above, will do a fecal Hemoccult today and recheck hemoglobin 3. Rectal pain: pt describes this mainly occurs at night, does not always occur with a bowel movement, question proctalgia fugax versus anal fissure versus other  Plan: 1. Recommend EGD and colonoscopy for further evaluation. Did discuss with the patient that if his occult stool studies did not show any blood, then we can cancel EGD. These will need to be performed in the hospital as patient cannot manipulate himself with bilateral BKA. Discussed risks, benefits, limitations and alternatives and the patient agrees to proceed. These are scheduled with Dr. Hilarie Fredrickson on February 20th 2.  Advised patient to hold his Coumadin for 7 days prior to the procedure. We will communicatewith his cardiologist to ensure that holding his Coumadin is acceptable. 3. Ordered repeat hemoglobin today. If this continues to be low, we will recheck in one week. 4. Discussed with the patient and his wife that I could not see a clear etiology for rectal pain today. Did discuss the possibility of an anal fissure versus hemorrhoids versus proctalgia fugax. Dr. Hilarie Fredrickson will better be able to tell them what is happening after time of colonoscopy. He can also do biopsies for microscopic colitis at that time. 5. Patient to follow in clinic after time of procedures per Dr. Vena Rua recommendations.  Oscar Newer, PA-C [facility] 08/20/2016, 2:17 PM  Cc: [name], MD   Addendum: Reviewed and agree with initial management. Jerene Bears, MD

## 2016-08-22 DIAGNOSIS — N186 End stage renal disease: Secondary | ICD-10-CM | POA: Diagnosis not present

## 2016-08-22 DIAGNOSIS — N2581 Secondary hyperparathyroidism of renal origin: Secondary | ICD-10-CM | POA: Diagnosis not present

## 2016-08-22 DIAGNOSIS — E1122 Type 2 diabetes mellitus with diabetic chronic kidney disease: Secondary | ICD-10-CM | POA: Diagnosis not present

## 2016-08-24 ENCOUNTER — Ambulatory Visit (INDEPENDENT_AMBULATORY_CARE_PROVIDER_SITE_OTHER): Payer: Medicare Other | Admitting: *Deleted

## 2016-08-24 DIAGNOSIS — I4891 Unspecified atrial fibrillation: Secondary | ICD-10-CM | POA: Diagnosis not present

## 2016-08-24 DIAGNOSIS — I6389 Other cerebral infarction: Secondary | ICD-10-CM

## 2016-08-24 DIAGNOSIS — I638 Other cerebral infarction: Secondary | ICD-10-CM

## 2016-08-24 DIAGNOSIS — G459 Transient cerebral ischemic attack, unspecified: Secondary | ICD-10-CM

## 2016-08-24 LAB — POCT INR: INR: 2.5

## 2016-08-24 NOTE — Telephone Encounter (Signed)
Patient is followed by our coumadin clinic and will require bridging with Lovenox due to hx of CVA.  He is scheduled 09/15/16.  Will forward to Fuller Canada, PharmD for review/advisement.

## 2016-08-24 NOTE — Telephone Encounter (Signed)
Patient needs to be bridged with lovenox  Hx of CVA

## 2016-08-25 DIAGNOSIS — N2581 Secondary hyperparathyroidism of renal origin: Secondary | ICD-10-CM | POA: Diagnosis not present

## 2016-08-25 DIAGNOSIS — E1122 Type 2 diabetes mellitus with diabetic chronic kidney disease: Secondary | ICD-10-CM | POA: Diagnosis not present

## 2016-08-25 DIAGNOSIS — N186 End stage renal disease: Secondary | ICD-10-CM | POA: Diagnosis not present

## 2016-08-25 NOTE — Telephone Encounter (Signed)
Pt has appt in Coumadin clinic 2/12 to coordinate Lovenox bridge.

## 2016-08-26 ENCOUNTER — Other Ambulatory Visit (INDEPENDENT_AMBULATORY_CARE_PROVIDER_SITE_OTHER): Payer: Medicare Other

## 2016-08-26 DIAGNOSIS — E1122 Type 2 diabetes mellitus with diabetic chronic kidney disease: Secondary | ICD-10-CM | POA: Diagnosis not present

## 2016-08-26 DIAGNOSIS — R197 Diarrhea, unspecified: Secondary | ICD-10-CM | POA: Diagnosis not present

## 2016-08-26 DIAGNOSIS — K6289 Other specified diseases of anus and rectum: Secondary | ICD-10-CM | POA: Diagnosis not present

## 2016-08-26 DIAGNOSIS — K921 Melena: Secondary | ICD-10-CM | POA: Diagnosis not present

## 2016-08-26 DIAGNOSIS — Z992 Dependence on renal dialysis: Secondary | ICD-10-CM | POA: Diagnosis not present

## 2016-08-26 DIAGNOSIS — N186 End stage renal disease: Secondary | ICD-10-CM | POA: Diagnosis not present

## 2016-08-26 LAB — FECAL OCCULT BLOOD, IMMUNOCHEMICAL: Fecal Occult Bld: POSITIVE — AB

## 2016-08-27 DIAGNOSIS — N186 End stage renal disease: Secondary | ICD-10-CM | POA: Diagnosis not present

## 2016-08-27 DIAGNOSIS — N2581 Secondary hyperparathyroidism of renal origin: Secondary | ICD-10-CM | POA: Diagnosis not present

## 2016-08-27 DIAGNOSIS — Z23 Encounter for immunization: Secondary | ICD-10-CM | POA: Diagnosis not present

## 2016-08-27 DIAGNOSIS — E1122 Type 2 diabetes mellitus with diabetic chronic kidney disease: Secondary | ICD-10-CM | POA: Diagnosis not present

## 2016-08-29 DIAGNOSIS — N186 End stage renal disease: Secondary | ICD-10-CM | POA: Diagnosis not present

## 2016-08-29 DIAGNOSIS — Z23 Encounter for immunization: Secondary | ICD-10-CM | POA: Diagnosis not present

## 2016-08-29 DIAGNOSIS — E1122 Type 2 diabetes mellitus with diabetic chronic kidney disease: Secondary | ICD-10-CM | POA: Diagnosis not present

## 2016-08-29 DIAGNOSIS — N2581 Secondary hyperparathyroidism of renal origin: Secondary | ICD-10-CM | POA: Diagnosis not present

## 2016-09-01 DIAGNOSIS — N2581 Secondary hyperparathyroidism of renal origin: Secondary | ICD-10-CM | POA: Diagnosis not present

## 2016-09-01 DIAGNOSIS — Z23 Encounter for immunization: Secondary | ICD-10-CM | POA: Diagnosis not present

## 2016-09-01 DIAGNOSIS — E1122 Type 2 diabetes mellitus with diabetic chronic kidney disease: Secondary | ICD-10-CM | POA: Diagnosis not present

## 2016-09-01 DIAGNOSIS — N186 End stage renal disease: Secondary | ICD-10-CM | POA: Diagnosis not present

## 2016-09-03 DIAGNOSIS — N186 End stage renal disease: Secondary | ICD-10-CM | POA: Diagnosis not present

## 2016-09-03 DIAGNOSIS — Z23 Encounter for immunization: Secondary | ICD-10-CM | POA: Diagnosis not present

## 2016-09-03 DIAGNOSIS — N2581 Secondary hyperparathyroidism of renal origin: Secondary | ICD-10-CM | POA: Diagnosis not present

## 2016-09-03 DIAGNOSIS — E1122 Type 2 diabetes mellitus with diabetic chronic kidney disease: Secondary | ICD-10-CM | POA: Diagnosis not present

## 2016-09-05 DIAGNOSIS — N2581 Secondary hyperparathyroidism of renal origin: Secondary | ICD-10-CM | POA: Diagnosis not present

## 2016-09-05 DIAGNOSIS — N186 End stage renal disease: Secondary | ICD-10-CM | POA: Diagnosis not present

## 2016-09-05 DIAGNOSIS — Z23 Encounter for immunization: Secondary | ICD-10-CM | POA: Diagnosis not present

## 2016-09-05 DIAGNOSIS — E1122 Type 2 diabetes mellitus with diabetic chronic kidney disease: Secondary | ICD-10-CM | POA: Diagnosis not present

## 2016-09-07 ENCOUNTER — Ambulatory Visit (INDEPENDENT_AMBULATORY_CARE_PROVIDER_SITE_OTHER): Payer: Medicare Other | Admitting: *Deleted

## 2016-09-07 DIAGNOSIS — I638 Other cerebral infarction: Secondary | ICD-10-CM | POA: Diagnosis not present

## 2016-09-07 DIAGNOSIS — I4891 Unspecified atrial fibrillation: Secondary | ICD-10-CM | POA: Diagnosis not present

## 2016-09-07 DIAGNOSIS — G459 Transient cerebral ischemic attack, unspecified: Secondary | ICD-10-CM

## 2016-09-07 DIAGNOSIS — I6389 Other cerebral infarction: Secondary | ICD-10-CM

## 2016-09-07 LAB — POCT INR: INR: 3.6

## 2016-09-07 MED ORDER — ENOXAPARIN SODIUM 100 MG/ML ~~LOC~~ SOLN: 100.0000 mg | SUBCUTANEOUS | 1 refills | Status: DC

## 2016-09-07 NOTE — Patient Instructions (Addendum)
09/09/16: Last dose of Coumadin.  09/10/16: No Coumadin or Lovenox.  09/11/16: Inject Lovenox 100mg  in the fatty abdominal tissue at least 2 inches from the belly button once a day at 8pm, rotate sites. No Coumadin.  09/12/16: Inject Lovenox in the fatty tissue once a day at 8pm. No Coumadin.  09/13/16: Inject Lovenox in the fatty tissue once a day at 8pm. No Coumadin.  09/14/16: No Lovenox. No Coumadin.  09/15/16: Procedure Day - No Lovenox - Resume Coumadin in the evening or as directed by doctor (take an extra half tablet with usual dose for 2 days then resume normal dose).  09/16/16: Resume Lovenox inject in the fatty tissue once a day at 11am and take Coumadin(extra 1/2).  09/17/16: Inject Lovenox in the fatty tissue once a day at 11am and take Coumadin.  09/18/16: Inject Lovenox in the fatty tissue once a day at 11am and take Coumadin.  09/19/16: Inject Lovenox in the fatty tissue once a day at 11am and take Coumadin.  09/20/16: Inject Lovenox in the fatty tissue once a day at 11am and take Coumadin.  09/21/16: Coumadin appt to check INR.

## 2016-09-08 DIAGNOSIS — N2581 Secondary hyperparathyroidism of renal origin: Secondary | ICD-10-CM | POA: Diagnosis not present

## 2016-09-08 DIAGNOSIS — Z23 Encounter for immunization: Secondary | ICD-10-CM | POA: Diagnosis not present

## 2016-09-08 DIAGNOSIS — E1122 Type 2 diabetes mellitus with diabetic chronic kidney disease: Secondary | ICD-10-CM | POA: Diagnosis not present

## 2016-09-08 DIAGNOSIS — N186 End stage renal disease: Secondary | ICD-10-CM | POA: Diagnosis not present

## 2016-09-10 DIAGNOSIS — N186 End stage renal disease: Secondary | ICD-10-CM | POA: Diagnosis not present

## 2016-09-10 DIAGNOSIS — N2581 Secondary hyperparathyroidism of renal origin: Secondary | ICD-10-CM | POA: Diagnosis not present

## 2016-09-10 DIAGNOSIS — Z23 Encounter for immunization: Secondary | ICD-10-CM | POA: Diagnosis not present

## 2016-09-10 DIAGNOSIS — E1122 Type 2 diabetes mellitus with diabetic chronic kidney disease: Secondary | ICD-10-CM | POA: Diagnosis not present

## 2016-09-12 DIAGNOSIS — N186 End stage renal disease: Secondary | ICD-10-CM | POA: Diagnosis not present

## 2016-09-12 DIAGNOSIS — Z23 Encounter for immunization: Secondary | ICD-10-CM | POA: Diagnosis not present

## 2016-09-12 DIAGNOSIS — N2581 Secondary hyperparathyroidism of renal origin: Secondary | ICD-10-CM | POA: Diagnosis not present

## 2016-09-12 DIAGNOSIS — E1122 Type 2 diabetes mellitus with diabetic chronic kidney disease: Secondary | ICD-10-CM | POA: Diagnosis not present

## 2016-09-14 ENCOUNTER — Telehealth: Payer: Self-pay

## 2016-09-14 NOTE — Telephone Encounter (Signed)
Pts appt time moved up to 9:15am at [facility] tomorrow am. Pt to arrive there at 8am and drink second dose of prep at 4:15am. Pt verbalized understanding.

## 2016-09-15 ENCOUNTER — Telehealth: Payer: Self-pay | Admitting: *Deleted

## 2016-09-15 ENCOUNTER — Encounter (HOSPITAL_COMMUNITY): Payer: Self-pay | Admitting: Anesthesiology

## 2016-09-15 ENCOUNTER — Observation Stay (HOSPITAL_COMMUNITY)
Admission: RE | Admit: 2016-09-15 | Discharge: 2016-09-16 | Disposition: A | Discharge status: Home / Self Care | Payer: Medicare Other | Source: Ambulatory Visit | Attending: Internal Medicine | Admitting: Internal Medicine

## 2016-09-15 ENCOUNTER — Encounter (HOSPITAL_COMMUNITY)
Admission: RE | Disposition: A | Discharge status: Home / Self Care | Payer: Self-pay | Source: Ambulatory Visit | Attending: Internal Medicine

## 2016-09-15 DIAGNOSIS — R195 Other fecal abnormalities: Principal | ICD-10-CM

## 2016-09-15 DIAGNOSIS — Z79899 Other long term (current) drug therapy: Secondary | ICD-10-CM | POA: Insufficient documentation

## 2016-09-15 DIAGNOSIS — Z23 Encounter for immunization: Secondary | ICD-10-CM | POA: Diagnosis not present

## 2016-09-15 DIAGNOSIS — N186 End stage renal disease: Secondary | ICD-10-CM | POA: Diagnosis not present

## 2016-09-15 DIAGNOSIS — I110 Hypertensive heart disease with heart failure: Secondary | ICD-10-CM | POA: Diagnosis present

## 2016-09-15 DIAGNOSIS — Z89511 Acquired absence of right leg below knee: Secondary | ICD-10-CM | POA: Diagnosis not present

## 2016-09-15 DIAGNOSIS — I4891 Unspecified atrial fibrillation: Secondary | ICD-10-CM | POA: Diagnosis not present

## 2016-09-15 DIAGNOSIS — I639 Cerebral infarction, unspecified: Secondary | ICD-10-CM | POA: Diagnosis present

## 2016-09-15 DIAGNOSIS — Z992 Dependence on renal dialysis: Secondary | ICD-10-CM | POA: Insufficient documentation

## 2016-09-15 DIAGNOSIS — K6289 Other specified diseases of anus and rectum: Secondary | ICD-10-CM | POA: Diagnosis not present

## 2016-09-15 DIAGNOSIS — D124 Benign neoplasm of descending colon: Secondary | ICD-10-CM

## 2016-09-15 DIAGNOSIS — I10 Essential (primary) hypertension: Secondary | ICD-10-CM | POA: Diagnosis not present

## 2016-09-15 DIAGNOSIS — K921 Melena: Secondary | ICD-10-CM

## 2016-09-15 DIAGNOSIS — K2100 Gastro-esophageal reflux disease with esophagitis, without bleeding: Secondary | ICD-10-CM

## 2016-09-15 DIAGNOSIS — I480 Paroxysmal atrial fibrillation: Secondary | ICD-10-CM

## 2016-09-15 DIAGNOSIS — D500 Iron deficiency anemia secondary to blood loss (chronic): Secondary | ICD-10-CM | POA: Diagnosis not present

## 2016-09-15 DIAGNOSIS — K269 Duodenal ulcer, unspecified as acute or chronic, without hemorrhage or perforation: Secondary | ICD-10-CM

## 2016-09-15 DIAGNOSIS — I132 Hypertensive heart and chronic kidney disease with heart failure and with stage 5 chronic kidney disease, or end stage renal disease: Secondary | ICD-10-CM | POA: Diagnosis not present

## 2016-09-15 DIAGNOSIS — K2950 Unspecified chronic gastritis without bleeding: Secondary | ICD-10-CM | POA: Diagnosis not present

## 2016-09-15 DIAGNOSIS — K635 Polyp of colon: Secondary | ICD-10-CM | POA: Insufficient documentation

## 2016-09-15 DIAGNOSIS — K2980 Duodenitis without bleeding: Secondary | ICD-10-CM | POA: Insufficient documentation

## 2016-09-15 DIAGNOSIS — I5042 Chronic combined systolic (congestive) and diastolic (congestive) heart failure: Secondary | ICD-10-CM | POA: Insufficient documentation

## 2016-09-15 DIAGNOSIS — Z89512 Acquired absence of left leg below knee: Secondary | ICD-10-CM | POA: Insufficient documentation

## 2016-09-15 DIAGNOSIS — R197 Diarrhea, unspecified: Secondary | ICD-10-CM | POA: Diagnosis not present

## 2016-09-15 DIAGNOSIS — K449 Diaphragmatic hernia without obstruction or gangrene: Secondary | ICD-10-CM | POA: Insufficient documentation

## 2016-09-15 DIAGNOSIS — N2581 Secondary hyperparathyroidism of renal origin: Secondary | ICD-10-CM | POA: Diagnosis not present

## 2016-09-15 DIAGNOSIS — E1122 Type 2 diabetes mellitus with diabetic chronic kidney disease: Secondary | ICD-10-CM | POA: Diagnosis not present

## 2016-09-15 LAB — CBC
HEMATOCRIT: 34 % — AB (ref 39.0–52.0)
HEMOGLOBIN: 11.1 g/dL — AB (ref 13.0–17.0)
MCH: 27.6 pg (ref 26.0–34.0)
MCHC: 32.6 g/dL (ref 30.0–36.0)
MCV: 84.6 fL (ref 78.0–100.0)
PLATELETS: 255 10*3/uL (ref 150–400)
RBC: 4.02 MIL/uL — AB (ref 4.22–5.81)
RDW: 14.9 % (ref 11.5–15.5)
WBC: 5.5 10*3/uL (ref 4.0–10.5)

## 2016-09-15 LAB — BASIC METABOLIC PANEL
ANION GAP: 11 (ref 5–15)
BUN: 54 mg/dL — AB (ref 6–20)
CHLORIDE: 96 mmol/L — AB (ref 101–111)
CO2: 29 mmol/L (ref 22–32)
Calcium: 9.2 mg/dL (ref 8.9–10.3)
Creatinine, Ser: 8.95 mg/dL — ABNORMAL HIGH (ref 0.61–1.24)
GFR calc Af Amer: 6 mL/min — ABNORMAL LOW (ref >60)
GFR, EST NON AFRICAN AMERICAN: 5 mL/min — AB (ref >60)
Glucose, Bld: 142 mg/dL — ABNORMAL HIGH (ref 65–99)
POTASSIUM: 4.4 mmol/L (ref 3.5–5.1)
SODIUM: 136 mmol/L (ref 135–145)

## 2016-09-15 LAB — PROTIME-INR
INR: 1.94
Prothrombin Time: 22.4 seconds — ABNORMAL HIGH (ref 11.4–15.2)

## 2016-09-15 LAB — APTT: APTT: 54 s — AB (ref 24–36)

## 2016-09-15 LAB — GLUCOSE, CAPILLARY: GLUCOSE-CAPILLARY: 159 mg/dL — AB (ref 65–99)

## 2016-09-15 SURGERY — CANCELLED PROCEDURE

## 2016-09-15 MED ORDER — HYDRALAZINE HCL 50 MG PO TABS
100.0000 mg | ORAL_TABLET | Freq: Three times a day (TID) | ORAL | Status: DC
Start: 2016-09-15 — End: 2016-09-16
  Administered 2016-09-15 – 2016-09-16 (×3): 100 mg via ORAL
  Filled 2016-09-15 (×3): qty 2

## 2016-09-15 MED ORDER — SUCROFERRIC OXYHYDROXIDE 500 MG PO CHEW
500.0000 mg | CHEWABLE_TABLET | Freq: Three times a day (TID) | ORAL | Status: DC
  Administered 2016-09-15 – 2016-09-16 (×3): 500 mg via ORAL
  Filled 2016-09-15 (×3): qty 1

## 2016-09-15 MED ORDER — METOPROLOL SUCCINATE ER 100 MG PO TB24
100.0000 mg | ORAL_TABLET | Freq: Every day | ORAL | Status: DC
  Administered 2016-09-15 – 2016-09-16 (×2): 100 mg via ORAL
  Filled 2016-09-15 (×2): qty 1

## 2016-09-15 MED ORDER — HYDROCODONE-ACETAMINOPHEN 5-325 MG PO TABS: 1.0000 | ORAL_TABLET | Freq: Four times a day (QID) | ORAL | Status: DC | PRN

## 2016-09-15 MED ORDER — PEG-KCL-NACL-NASULF-NA ASC-C 100 G PO SOLR
0.5000 | Freq: Once | ORAL | Status: AC
  Administered 2016-09-15: 100 g via ORAL
  Filled 2016-09-15: qty 1

## 2016-09-15 MED ORDER — ACETAMINOPHEN 650 MG RE SUPP: 650.0000 mg | Freq: Four times a day (QID) | RECTAL | Status: DC | PRN

## 2016-09-15 MED ORDER — PEG-KCL-NACL-NASULF-NA ASC-C 100 G PO SOLR
0.5000 | Freq: Once | ORAL | Status: AC
  Administered 2016-09-16: 100 g via ORAL

## 2016-09-15 MED ORDER — SODIUM CHLORIDE 0.9% FLUSH
3.0000 mL | Freq: Two times a day (BID) | INTRAVENOUS | Status: DC
  Administered 2016-09-16: 3 mL via INTRAVENOUS

## 2016-09-15 MED ORDER — PROPOFOL 10 MG/ML IV BOLUS
INTRAVENOUS | Status: AC
  Filled 2016-09-15: qty 40

## 2016-09-15 MED ORDER — INSULIN ASPART 100 UNIT/ML ~~LOC~~ SOLN
0.0000 [IU] | Freq: Three times a day (TID) | SUBCUTANEOUS | Status: DC
  Administered 2016-09-15: 2 [IU] via SUBCUTANEOUS

## 2016-09-15 MED ORDER — PEG-KCL-NACL-NASULF-NA ASC-C 100 G PO SOLR: 1.0000 | Freq: Once | ORAL | Status: DC

## 2016-09-15 MED ORDER — DILTIAZEM HCL ER COATED BEADS 120 MG PO CP24
120.0000 mg | ORAL_CAPSULE | Freq: Every day | ORAL | Status: DC
  Administered 2016-09-16: 120 mg via ORAL
  Filled 2016-09-15: qty 1

## 2016-09-15 MED ORDER — CALCIUM ACETATE (PHOS BINDER) 667 MG PO CAPS
2001.0000 mg | ORAL_CAPSULE | Freq: Three times a day (TID) | ORAL | Status: DC
  Administered 2016-09-15 – 2016-09-16 (×3): 2001 mg via ORAL
  Filled 2016-09-15 (×4): qty 3

## 2016-09-15 MED ORDER — CINACALCET HCL 30 MG PO TABS
30.0000 mg | ORAL_TABLET | Freq: Every evening | ORAL | Status: DC
  Administered 2016-09-15 – 2016-09-16 (×2): 30 mg via ORAL
  Filled 2016-09-15 (×2): qty 1

## 2016-09-15 MED ORDER — ACETAMINOPHEN 325 MG PO TABS: 650.0000 mg | ORAL_TABLET | Freq: Four times a day (QID) | ORAL | Status: DC | PRN

## 2016-09-15 MED ORDER — CYCLOBENZAPRINE HCL 5 MG PO TABS: 5.0000 mg | ORAL_TABLET | Freq: Three times a day (TID) | ORAL | Status: DC | PRN

## 2016-09-15 MED ORDER — LATANOPROST 0.005 % OP SOLN
1.0000 [drp] | Freq: Every day | OPHTHALMIC | Status: DC
  Administered 2016-09-15: 1 [drp] via OPHTHALMIC
  Filled 2016-09-15: qty 2.5

## 2016-09-15 MED ORDER — QUETIAPINE FUMARATE 25 MG PO TABS
25.0000 mg | ORAL_TABLET | Freq: Two times a day (BID) | ORAL | Status: DC
  Administered 2016-09-15 – 2016-09-16 (×2): 25 mg via ORAL
  Filled 2016-09-15 (×2): qty 1

## 2016-09-15 MED ORDER — SODIUM CHLORIDE 0.9 % IV SOLN: INTRAVENOUS | Status: DC

## 2016-09-15 MED ORDER — ATORVASTATIN CALCIUM 40 MG PO TABS
40.0000 mg | ORAL_TABLET | Freq: Every day | ORAL | Status: DC
  Administered 2016-09-15 – 2016-09-16 (×2): 40 mg via ORAL
  Filled 2016-09-15 (×2): qty 1

## 2016-09-15 MED ORDER — HEPARIN (PORCINE) IN NACL 100-0.45 UNIT/ML-% IJ SOLN
1200.0000 [IU]/h | INTRAMUSCULAR | Status: DC
  Administered 2016-09-15: 1200 [IU]/h via INTRAVENOUS
  Filled 2016-09-15: qty 250

## 2016-09-15 SURGICAL SUPPLY — 22 items

## 2016-09-15 NOTE — Telephone Encounter (Signed)
Moviprep instructions created for Dr Hilarie Fredrickson to give to patient.

## 2016-09-15 NOTE — Progress Notes (Addendum)
ANTICOAGULATION CONSULT NOTE - Initial Consult  Pharmacy Consult for Heparin Indication: atrial fibrillation  No Known Allergies  Patient Measurements: Weight: 230 lb (104.3 kg) (per patient) Heparin Dosing Weight: 82 kg  Vital Signs: Temp: 98 F (36.7 C) (02/20 1257) Temp Source: Oral (02/20 1257) BP: 141/88 (02/20 1257) Pulse Rate: 75 (02/20 1257)  Labs:  Recent Labs  09/15/16 1618  HGB 11.1*  HCT 34.0*  PLT 255  APTT 54*  LABPROT 22.4*  INR 1.94    CrCl cannot be calculated (Patient's most recent lab result is older than the maximum 21 days allowed.).   Medical History: Past Medical History:  Diagnosis Date  . Anemia   . Antral ulcer 2014   small  . BACK PAIN, LUMBAR, CHRONIC   . BENIGN PROSTATIC HYPERTROPHY   . CEREBROVASCULAR ACCIDENT, HX OF   . CHOLELITHIASIS   . Chronic combined systolic and diastolic CHF (congestive heart failure) ([place])   . Complication of anesthesia    wife states pt had trouble waking up with his last surgery in Nov., 2014  . DEPRESSION   . DIABETES MELLITUS, TYPE II   . ERECTILE DYSFUNCTION   . ESRD on hemodialysis ([place])    ESRD due to DM/HTN. Started dialysis in November 2013.  HD TTS at [facility] on [place].  [name] GERD   . Hemorrhoids   . HEPATITIS C, HX OF   . History of Clostridium difficile   . HYPERTENSION   . LBBB (left bundle branch block)   . Morbid obesity ([place])   . NEPHROLITHIASIS, HX OF   . PAF (paroxysmal atrial fibrillation) ([place])    a. Dx 12/2015.  . S/P bilateral BKA (below knee amputation) ([facility])     Assessment:  66 yr male on warfarin PTA for AFib.  Was scheduled to undergo EGD this AM and warfarin has been on hold and patient on Lovenox 100mg  sq q24h as bridge therapy.  EGD unable to be accomplished today and rescheduled for 2/21.  Pharmacy has been consulted to dose IV heparin until EGD on 2/21  Per patient, last dose of Lovenox 100mg  was administered on 09/14/16 @ 20:00.  Due to renal functio Goal  of Therapy:  Heparin level 0.3-0.7 units/ml Monitor platelets by anticoagulation protocol: Yes   Plan:   Will begin IV heparin at 18:00 (2 hr before next dose of Lovenox would have been due)    No heparin bolus; begin IV heparin infusion @ 1200 units/hr  Check heparin level 8 hr after heparin started  Check AM CBC  Leana Springston, Toribio Harbour, PharmD 09/15/2016,4:54 PM

## 2016-09-15 NOTE — H&P (Signed)
History and Physical  Oscar Castillo JGG:[id number] DOB: May 12, 1951 DOA: 09/15/2016   PCP: [name], MD   Patient coming from: Home  Chief Complaint: general weakness, blood loss anemia  HPI:  Oscar Castillo is a 66 y.o. male with medical history of systolic and diastolic CHF, atrial fibrillation on coumadin, stroke, diabetes mellitus type 2, ESRD (Tues-Th-Sat), PVD,  hypertension, bilateral BKA presented to the endoscopy unit on the morning of 09/15/2016 for EGD and colonoscopy. Unfortunately, the patient's prep was inadequate and will require additional prepping. Unfortunately, the patient has poor mobility and difficulty prepping at home due to his BKA status as well as some generalized weakness. As result, the patient will be directly admitted for observation for GI prep for repeat EGD and colonoscopy on 09/16/2016 tentatively scheduled at 1:30 PM. The patient was recently seen at [place] office on 08/20/16 for rectal pain, diarrhea, heme positive stool.  He was recently seen in the ED on 08/05/16 for the same complaints. At that time he had CBC showing a mild anemia with hemoglobin of 11.3, normal lipase and CMP showing mild hyponatremia at 134 with normal hepatic enzymes.  Patient's last colonoscopy was on 11/03/12 by Dr. Cristina Gong for hematochezia of unclear cause during a hospitalization, there were signs of an active GI bleed most likely from his ileal cecal valve. No gross colonic abnormalities identified apart from blood throughout the colonic lumen.   The patient denies any fevers, chills, chest pain, shortness breath, coughing, hemoptysis, nausea, vomiting, abdominal pain. He complains of dark/black stools without hematochezia.    Assessment/Plan: Heme positive stool/Rectal pain -EGD/colonoscopy planned 09/16/2016 after repeat prep  -If unremarkable, plan for discharge after endoscopy 05/16/2017  -Check CBC  -clear liquids today -[facility] to follow  Atrial fibrillation -Rate  controlled -check INR -IV heparin until endoscopy tomorrow -restart coumadin after endoscopy with lovenox bridge which pt has previously done -continue metoprolol succinate  ESRD -pt already had HD 09/15/16 am -continue phosphate binders -next HD 09/17/16  Diabetes mellitus type 2 -sliding scale insuling -check A1C  Anemia of CKD -baseline Hgb 11-12 -check CBC  Chronic systolic and diastolic CHF -clinically compensated -daily weight -12/27/2015 Echo EF 50-55%, no WMA,severe AV thickening without AS/AI, mild MR -volume managed by HD  HTN -continue home meds--hydralazine, metoprolol succinate  Hyperlipidemia -continue statin       Past Medical History:  Diagnosis Date  . Anemia   . Antral ulcer 2014   small  . BACK PAIN, LUMBAR, CHRONIC   . BENIGN PROSTATIC HYPERTROPHY   . CEREBROVASCULAR ACCIDENT, HX OF   . CHOLELITHIASIS   . Chronic combined systolic and diastolic CHF (congestive heart failure) ([place])   . Complication of anesthesia    wife states pt had trouble waking up with his last surgery in Nov., 2014  . DEPRESSION   . DIABETES MELLITUS, TYPE II   . ERECTILE DYSFUNCTION   . ESRD on hemodialysis ([place])    ESRD due to DM/HTN. Started dialysis in November 2013.  HD TTS at [facility] on [address].  [name] GERD   . Hemorrhoids   . HEPATITIS C, HX OF   . History of Clostridium difficile   . HYPERTENSION   . LBBB (left bundle branch block)   . Morbid obesity ([place])   . NEPHROLITHIASIS, HX OF   . PAF (paroxysmal atrial fibrillation) ([place])    a. Dx 12/2015.  . S/P bilateral BKA (below knee amputation) ([place])  Past Surgical History:  Procedure Laterality Date  . AMPUTATION Left 05/12/2013   Procedure: AMPUTATION RAY;  Surgeon: Newt Minion, MD;  Location: [place];  Service: Orthopedics;  Laterality: Left;  Left Foot 1st Ray Amputation  . AMPUTATION Left 06/09/2013   Procedure: AMPUTATION BELOW KNEE;  Surgeon: Newt Minion, MD;  Location: [place];  Service:  Orthopedics;  Laterality: Left;  Left Below Knee Amputation and removal proximal screws IM tibial nail  . AMPUTATION Right 09/08/2013   Procedure: AMPUTATION BELOW KNEE;  Surgeon: Newt Minion, MD;  Location: [place];  Service: Orthopedics;  Laterality: Right;  Right Below Knee Amputation  . AMPUTATION Right 10/11/2013   Procedure: AMPUTATION BELOW KNEE;  Surgeon: Newt Minion, MD;  Location: [place];  Service: Orthopedics;  Laterality: Right;  Right Below Knee Amputation Revision  . AV FISTULA PLACEMENT  06/14/2012   Procedure: ARTERIOVENOUS (AV) FISTULA CREATION;  Surgeon: Angelia Mould, MD;  Location: [facility] OR;  Service: Vascular;  Laterality: Left;  Left basilic vein transposition with fistula.  . COLONOSCOPY N/A 10/28/2012   Procedure: COLONOSCOPY;  Surgeon: Jeryl Columbia, MD;  Location: [facility] ENDOSCOPY;  Service: Endoscopy;  Laterality: N/A;  . COLONOSCOPY N/A 11/02/2012   Procedure: COLONOSCOPY;  Surgeon: Cleotis Nipper, MD;  Location: [facility] ENDOSCOPY;  Service: Endoscopy;  Laterality: N/A;  . COLONOSCOPY N/A 11/03/2012   Procedure: COLONOSCOPY;  Surgeon: Cleotis Nipper, MD;  Location: [facility] ENDOSCOPY;  Service: Endoscopy;  Laterality: N/A;  . ENTEROSCOPY N/A 11/08/2012   Procedure: ENTEROSCOPY;  Surgeon: Wonda Horner, MD;  Location: [facility] ENDOSCOPY;  Service: Endoscopy;  Laterality: N/A;  . ESOPHAGOGASTRODUODENOSCOPY N/A 11/02/2012   Procedure: ESOPHAGOGASTRODUODENOSCOPY (EGD);  Surgeon: Cleotis Nipper, MD;  Location: [facility] ENDOSCOPY;  Service: Endoscopy;  Laterality: N/A;  . EYE SURGERY Left    to remove scar tissue  . GIVENS CAPSULE STUDY N/A 11/04/2012   Procedure: GIVENS CAPSULE STUDY;  Surgeon: Cleotis Nipper, MD;  Location: [facility] ENDOSCOPY;  Service: Endoscopy;  Laterality: N/A;  . HARDWARE REMOVAL Left 06/09/2013   Procedure: HARDWARE REMOVAL;  Surgeon: Newt Minion, MD;  Location: [place];  Service: Orthopedics;  Laterality: Left;  Left Below Knee Amputation  and Removal proximal screws IM tibial  nail  . NEPHRECTOMY     partial RR  . ORIF FIBULA FRACTURE Left 09/09/2012   Procedure: OPEN REDUCTION INTERNAL FIXATION (ORIF) FIBULA FRACTURE;  Surgeon: Johnny Bridge, MD;  Location: [place];  Service: Orthopedics;  Laterality: Left;  . TIBIA IM NAIL INSERTION Left 09/09/2012   Procedure: INTRAMEDULLARY (IM) NAIL TIBIAL;  Surgeon: Johnny Bridge, MD;  Location: [place];  Service: Orthopedics;  Laterality: Left;  left tibial nail and open reduction internal fixation left fibula fracture   Social History:  reports that he has never smoked. He has never used smokeless tobacco. He reports that he does not drink alcohol or use drugs.   Family History  Problem Relation Age of Onset  . Diabetes Mother   . Hypertension Mother   . Heart attack Father   . Hypertension Father   . Coronary artery disease Other      No Known Allergies   Prior to Admission medications   Medication Sig Start Date End Date Taking? Authorizing Provider  acetaminophen (TYLENOL) 500 MG tablet Take 500 mg by mouth every 6 (six) hours as needed for mild pain.   Yes Historical Provider, MD  atorvastatin (LIPITOR) 40 MG tablet TAKE 1 TABLET(40 MG)  BY MOUTH DAILY AT 6 PM 08/10/16  Yes [name], MD  calcium acetate (PHOSLO) 667 MG capsule Take 2,001 mg by mouth 3 (three) times daily with meals.   Yes Historical Provider, MD  cyclobenzaprine (FLEXERIL) 5 MG tablet Take 1 tablet (5 mg total) by mouth 3 (three) times daily as needed for muscle spasms. 07/08/16  Yes [name], MD  diltiazem (CARDIZEM CD) 120 MG 24 hr capsule Take 1 capsule (120 mg total) by mouth daily. 01/09/16  Yes [name], MD  enoxaparin (LOVENOX) 100 MG/ML injection Inject 1 mL (100 mg total) into the skin daily. 09/07/16  Yes Fay Records, MD  hydrALAZINE (APRESOLINE) 100 MG tablet TAKE 1 TABLET BY MOUTH EVERY 8 HOURS 07/07/16  Yes [name], MD  HYDROcodone-acetaminophen (NORCO/VICODIN) 5-325 MG tablet Take 1 tablet by mouth every 6 (six) hours  as needed for moderate pain.   Yes Historical Provider, MD  hydrOXYzine (ATARAX/VISTARIL) 25 MG tablet TAKE 1 TABLET(25 MG) BY MOUTH EVERY 6 HOURS AS NEEDED Patient taking differently: TAKE 1 TABLET(25 MG) BY MOUTH EVERY 6 HOURS AS NEEDED FOR ANXIETY 07/25/16  Yes [name], MD  ketoconazole (NIZORAL) 2 % cream Apply 1 application topically daily. Patient taking differently: Apply 1 application topically daily as needed for irritation.  11/13/15  Yes [name], MD  latanoprost (XALATAN) 0.005 % ophthalmic solution Place 1 drop into both eyes at bedtime.  06/12/16  Yes Historical Provider, MD  lidocaine-prilocaine (EMLA) cream APPLY A SMALL AMOUNT TO SKIN AT THE ACCESS SITE (AVF) AS DIRECTED BEFORE EACH DIALYSIS SESSION THREE DAYS A WEEK 06/03/15  Yes Historical Provider, MD  loperamide (IMODIUM) 2 MG capsule TAKE 1 CAPSULE BY MOUTH AS NEEDED FOR DIARRHEA OR LOOSE STOOLS 07/07/16  Yes [name], MD  metoprolol succinate (TOPROL-XL) 100 MG 24 hr tablet Take 1 tablet (100 mg total) by mouth daily. Take with or immediately following a meal. 01/09/16  Yes [name], MD  ondansetron (ZOFRAN ODT) 4 MG disintegrating tablet 29m ODT q4 hours prn nausea/vomit 06/16/16  Yes JMilton Ferguson MD  polyethylene glycol (MIRALAX / GLYCOLAX) packet Take 17 g by mouth 2 (two) times daily. Patient taking differently: Take 17 g by mouth daily as needed for mild constipation.  05/04/16  Yes CMerryl Hacker MD  QUEtiapine (SEROQUEL) 25 MG tablet Take 1 tablet (25 mg total) by mouth 2 (two) times daily. 04/07/16  Yes JBiagio Borg MD  SENSIPAR 30 MG tablet Take 30 mg by mouth every evening.  01/09/15  Yes Historical Provider, MD  silver sulfADIAZINE (SILVADENE) 1 % cream Apply 1 application topically daily. Patient taking differently: Apply 1 application topically daily as needed (irratation).  06/05/16  Yes MNewt Minion MD  sucroferric oxyhydroxide (VELPHORO) 500 MG chewable tablet Chew 500 mg by mouth 3 (three)  times daily with meals.   Yes Historical Provider, MD  traMADol (ULTRAM) 50 MG tablet Take 1 tablet (50 mg total) by mouth every 8 (eight) hours as needed. Patient taking differently: Take 50 mg by mouth every 8 (eight) hours.  06/01/16  Yes JBiagio Borg MD  triamcinolone cream (KENALOG) 0.1 % Apply 1 application topically as needed (irritation).  07/17/16  Yes Historical Provider, MD  VOLTAREN 1 % GEL APPLY 2 GRAMS TO HANDS BID TO TID AS NEEDED FOR PAIN. 09/19/15  Yes Historical Provider, MD  Na Sulfate-K Sulfate-Mg Sulf (SUPREP BOWEL PREP KIT) 17.5-3.13-1.6 GM/180ML SOLN Take 1  kit by mouth as directed. 08/20/16   Levin Erp, PA  warfarin (COUMADIN) 2.5 MG tablet TAKE 1 TABLET BY MOUTH AS DIRECTED 06/01/16   Fay Records, MD    Review of Systems:  Constitutional:  No weight loss, night sweats, Fevers, chills, fatigue.  Head&Eyes: No headache.  No vision loss.  No eye pain or scotoma ENT:  No Difficulty swallowing,Tooth/dental problems,Sore throat,  No ear ache, post nasal drip,  Cardio-vascular:  No chest pain, Orthopnea, PND, swelling in lower extremities,  dizziness, palpitations  GI:  No  abdominal pain, nausea, vomiting, diarrhea, loss of appetite, hematochezia, melena, heartburn, indigestion, Resp:  No shortness of breath with exertion or at rest. No cough. No coughing up of blood .No wheezing.No chest wall deformity  Skin:  no rash or lesions.  GU:  no dysuria, change in color of urine, no urgency or frequency. No flank pain.  Musculoskeletal:  No joint pain or swelling. No decreased range of motion. No back pain.  Psych:  No change in mood or affect. No depression or anxiety. Neurologic: No headache, no dysesthesia, no focal weakness, no vision loss. No syncope  Physical Exam: There were no vitals filed for this visit. General:  A&O x 3, NAD, nontoxic, pleasant/cooperative Head/Eye: No conjunctival hemorrhage, no icterus, [place]/AT, No nystagmus ENT:  No icterus,   No thrush, good dentition, no pharyngeal exudate Neck:  No masses, no lymphadenpathy, no bruits CV:  RRR, no rub, no gallop, no S3 Lung:  CTAB, good air movement, no wheeze, no rhonchi Abdomen: soft/NT, +BS, nondistended, no peritoneal signs Ext: No cyanosis, No rashes, No petechiae, No lymphangitis, No edema Neuro: CNII-XII intact, strength 4/5 in bilateral upper and lower extremities, no dysmetria  Labs on Admission:  Basic Metabolic Panel: No results for input(s): NA, K, CL, CO2, GLUCOSE, BUN, CREATININE, CALCIUM, MG, PHOS in the last 168 hours. Liver Function Tests: No results for input(s): AST, ALT, ALKPHOS, BILITOT, PROT, ALBUMIN in the last 168 hours. No results for input(s): LIPASE, AMYLASE in the last 168 hours. No results for input(s): AMMONIA in the last 168 hours. CBC: No results for input(s): WBC, NEUTROABS, HGB, HCT, MCV, PLT in the last 168 hours. Coagulation Profile: No results for input(s): INR, PROTIME in the last 168 hours. Cardiac Enzymes: No results for input(s): CKTOTAL, CKMB, CKMBINDEX, TROPONINI in the last 168 hours. BNP: Invalid input(s): POCBNP CBG: No results for input(s): GLUCAP in the last 168 hours. Urine analysis:    Component Value Date/Time   COLORURINE YELLOW 05/15/2013 0007   APPEARANCEUR CLEAR 05/15/2013 0007   LABSPEC 1.014 05/15/2013 0007   PHURINE 8.5 (H) 05/15/2013 0007   GLUCOSEU 250 (A) 05/15/2013 0007   GLUCOSEU 500 (?) 11/06/2009 0948   HGBUR NEGATIVE 05/15/2013 0007   BILIRUBINUR NEGATIVE 05/15/2013 0007   KETONESUR NEGATIVE 05/15/2013 0007   PROTEINUR >300 (A) 05/15/2013 0007   UROBILINOGEN 0.2 05/15/2013 0007   NITRITE NEGATIVE 05/15/2013 0007   LEUKOCYTESUR NEGATIVE 05/15/2013 0007   Sepsis Labs: _0 (procalcitonin:4,lacticidven:4) )No results found for this or any previous visit (from the past 240 hour(s)).   Radiological Exams on Admission: No results found.      Time spent:50 minutes Code Status:    FULL Family Communication:  No Family at bedside Disposition Plan: expect 1 day hospitalization Consults called: [facility] DVT Prophylaxis: Iv heparin  Arturo Sofranko, DO  [facility] Pager [phone]  If 7PM-7AM, please contact night-coverage [email or web address] Password [facility] 09/15/2016, 10:32 AM

## 2016-09-15 NOTE — H&P (Signed)
Pt seen recently in the office to evaluate dark, heme + stools, diarrhea and rectal pain Presented for outpatient EGD/colon but prep inadequate and not clear. Pt is a b/l LE amputee with limited mobility and difficult time prepping at home. Also on Lovenox bridge while warfarin held Will ask for hospitalist observation admission for repeat prep and plan EGD/colon tomorrow with MAC at 1:30 pm Discussed with pt and wife  Clear liquids until MN Okay for 1 dose of Lovenox this morning, but none tonight or tomorrow morning Will discuss with hospitalist and GI inpatient team

## 2016-09-16 ENCOUNTER — Observation Stay (HOSPITAL_COMMUNITY): Payer: Medicare Other | Admitting: Registered Nurse

## 2016-09-16 ENCOUNTER — Encounter (HOSPITAL_COMMUNITY): Payer: Self-pay | Admitting: *Deleted

## 2016-09-16 ENCOUNTER — Encounter (HOSPITAL_COMMUNITY)
Admission: RE | Disposition: A | Discharge status: Home / Self Care | Payer: Self-pay | Source: Ambulatory Visit | Attending: Internal Medicine

## 2016-09-16 DIAGNOSIS — I10 Essential (primary) hypertension: Secondary | ICD-10-CM

## 2016-09-16 DIAGNOSIS — R197 Diarrhea, unspecified: Secondary | ICD-10-CM | POA: Diagnosis not present

## 2016-09-16 DIAGNOSIS — I482 Chronic atrial fibrillation: Secondary | ICD-10-CM | POA: Diagnosis not present

## 2016-09-16 DIAGNOSIS — K635 Polyp of colon: Secondary | ICD-10-CM | POA: Diagnosis not present

## 2016-09-16 DIAGNOSIS — K449 Diaphragmatic hernia without obstruction or gangrene: Secondary | ICD-10-CM | POA: Diagnosis not present

## 2016-09-16 DIAGNOSIS — K2980 Duodenitis without bleeding: Secondary | ICD-10-CM

## 2016-09-16 DIAGNOSIS — N186 End stage renal disease: Secondary | ICD-10-CM

## 2016-09-16 DIAGNOSIS — Z992 Dependence on renal dialysis: Secondary | ICD-10-CM

## 2016-09-16 DIAGNOSIS — D124 Benign neoplasm of descending colon: Secondary | ICD-10-CM | POA: Diagnosis not present

## 2016-09-16 DIAGNOSIS — K269 Duodenal ulcer, unspecified as acute or chronic, without hemorrhage or perforation: Secondary | ICD-10-CM | POA: Diagnosis not present

## 2016-09-16 DIAGNOSIS — Z89512 Acquired absence of left leg below knee: Secondary | ICD-10-CM | POA: Diagnosis not present

## 2016-09-16 DIAGNOSIS — Z89511 Acquired absence of right leg below knee: Secondary | ICD-10-CM

## 2016-09-16 DIAGNOSIS — K2950 Unspecified chronic gastritis without bleeding: Secondary | ICD-10-CM | POA: Diagnosis not present

## 2016-09-16 DIAGNOSIS — R195 Other fecal abnormalities: Secondary | ICD-10-CM | POA: Diagnosis not present

## 2016-09-16 DIAGNOSIS — K6289 Other specified diseases of anus and rectum: Secondary | ICD-10-CM | POA: Diagnosis not present

## 2016-09-16 DIAGNOSIS — G459 Transient cerebral ischemic attack, unspecified: Secondary | ICD-10-CM | POA: Diagnosis not present

## 2016-09-16 DIAGNOSIS — I132 Hypertensive heart and chronic kidney disease with heart failure and with stage 5 chronic kidney disease, or end stage renal disease: Secondary | ICD-10-CM | POA: Diagnosis not present

## 2016-09-16 DIAGNOSIS — K921 Melena: Secondary | ICD-10-CM | POA: Diagnosis not present

## 2016-09-16 DIAGNOSIS — K2900 Acute gastritis without bleeding: Secondary | ICD-10-CM

## 2016-09-16 DIAGNOSIS — K2100 Gastro-esophageal reflux disease with esophagitis, without bleeding: Secondary | ICD-10-CM

## 2016-09-16 DIAGNOSIS — Z79899 Other long term (current) drug therapy: Secondary | ICD-10-CM | POA: Diagnosis not present

## 2016-09-16 HISTORY — PX: COLONOSCOPY: SHX5424

## 2016-09-16 HISTORY — PX: ESOPHAGOGASTRODUODENOSCOPY (EGD) WITH PROPOFOL: SHX5813

## 2016-09-16 LAB — GLUCOSE, CAPILLARY
GLUCOSE-CAPILLARY: 100 mg/dL — AB (ref 65–99)
GLUCOSE-CAPILLARY: 107 mg/dL — AB (ref 65–99)
Glucose-Capillary: 109 mg/dL — ABNORMAL HIGH (ref 65–99)
Glucose-Capillary: 113 mg/dL — ABNORMAL HIGH (ref 65–99)

## 2016-09-16 LAB — HEPARIN LEVEL (UNFRACTIONATED): Heparin Unfractionated: 0.76 IU/mL — ABNORMAL HIGH (ref 0.30–0.70)

## 2016-09-16 SURGERY — COLONOSCOPY
Anesthesia: Monitor Anesthesia Care

## 2016-09-16 MED ORDER — PHENYLEPHRINE HCL 10 MG/ML IJ SOLN
INTRAMUSCULAR | Status: AC
  Filled 2016-09-16: qty 2

## 2016-09-16 MED ORDER — POLYETHYLENE GLYCOL 3350 17 G PO PACK
17.0000 g | PACK | Freq: Every day | ORAL | Status: DC
  Administered 2016-09-16: 17 g via ORAL
  Filled 2016-09-16: qty 1

## 2016-09-16 MED ORDER — SILVER SULFADIAZINE 1 % EX CREA: 1.0000 "application " | TOPICAL_CREAM | Freq: Every day | CUTANEOUS | Status: DC | PRN

## 2016-09-16 MED ORDER — PROPOFOL 10 MG/ML IV BOLUS
INTRAVENOUS | Status: AC
  Filled 2016-09-16: qty 20

## 2016-09-16 MED ORDER — PHENYLEPHRINE 40 MCG/ML (10ML) SYRINGE FOR IV PUSH (FOR BLOOD PRESSURE SUPPORT)
PREFILLED_SYRINGE | INTRAVENOUS | Status: DC | PRN
  Administered 2016-09-16 (×4): 80 ug via INTRAVENOUS

## 2016-09-16 MED ORDER — PANTOPRAZOLE SODIUM 40 MG PO TBEC
40.0000 mg | DELAYED_RELEASE_TABLET | Freq: Two times a day (BID) | ORAL | Status: DC
  Administered 2016-09-16: 40 mg via ORAL
  Filled 2016-09-16: qty 1

## 2016-09-16 MED ORDER — PROPOFOL 10 MG/ML IV BOLUS
INTRAVENOUS | Status: AC
  Filled 2016-09-16: qty 40

## 2016-09-16 MED ORDER — EPHEDRINE 5 MG/ML INJ
INTRAVENOUS | Status: AC
  Filled 2016-09-16: qty 10

## 2016-09-16 MED ORDER — WARFARIN SODIUM 2.5 MG PO TABS: ORAL_TABLET | ORAL | 1 refills | Status: DC

## 2016-09-16 MED ORDER — EPHEDRINE SULFATE-NACL 50-0.9 MG/10ML-% IV SOSY
PREFILLED_SYRINGE | INTRAVENOUS | Status: DC | PRN
  Administered 2016-09-16 (×3): 10 mg via INTRAVENOUS

## 2016-09-16 MED ORDER — PROPOFOL 500 MG/50ML IV EMUL
INTRAVENOUS | Status: DC | PRN
  Administered 2016-09-16: 130 ug/kg/min via INTRAVENOUS

## 2016-09-16 MED ORDER — HEPARIN (PORCINE) IN NACL 100-0.45 UNIT/ML-% IJ SOLN: 1100.0000 [IU]/h | INTRAMUSCULAR | Status: DC

## 2016-09-16 MED ORDER — SODIUM CHLORIDE 0.9 % IV SOLN
INTRAVENOUS | Status: DC
  Administered 2016-09-16: 13:00:00 via INTRAVENOUS

## 2016-09-16 MED ORDER — PHENYLEPHRINE HCL 10 MG/ML IJ SOLN
INTRAVENOUS | Status: DC | PRN
  Administered 2016-09-16: 25 ug/min via INTRAVENOUS

## 2016-09-16 MED ORDER — LIDOCAINE 2% (20 MG/ML) 5 ML SYRINGE
INTRAMUSCULAR | Status: AC
  Filled 2016-09-16: qty 5

## 2016-09-16 MED ORDER — LIDOCAINE 2% (20 MG/ML) 5 ML SYRINGE
INTRAMUSCULAR | Status: DC | PRN
  Administered 2016-09-16: 100 mg via INTRAVENOUS

## 2016-09-16 MED ORDER — PROPOFOL 10 MG/ML IV BOLUS
INTRAVENOUS | Status: DC | PRN
  Administered 2016-09-16: 20 mg via INTRAVENOUS

## 2016-09-16 MED ORDER — KETOCONAZOLE 2 % EX CREA: 1.0000 "application " | TOPICAL_CREAM | Freq: Every day | CUTANEOUS | Status: DC | PRN

## 2016-09-16 MED ORDER — PANTOPRAZOLE SODIUM 40 MG PO TBEC: 40.0000 mg | DELAYED_RELEASE_TABLET | Freq: Two times a day (BID) | ORAL | 1 refills | Status: DC

## 2016-09-16 MED ORDER — POLYETHYLENE GLYCOL 3350 17 G PO PACK: 17.0000 g | PACK | Freq: Two times a day (BID) | ORAL | 0 refills | Status: DC

## 2016-09-16 MED ORDER — PHENYLEPHRINE 40 MCG/ML (10ML) SYRINGE FOR IV PUSH (FOR BLOOD PRESSURE SUPPORT)
PREFILLED_SYRINGE | INTRAVENOUS | Status: AC
  Filled 2016-09-16: qty 10

## 2016-09-16 SURGICAL SUPPLY — 15 items

## 2016-09-16 NOTE — Progress Notes (Signed)
ANTICOAGULATION CONSULT NOTE - F/u Consult  Pharmacy Consult for Heparin Indication: atrial fibrillation  No Known Allergies  Patient Measurements: Weight: 230 lb (104.3 kg) (per patient) Heparin Dosing Weight: 82 kg  Vital Signs: Temp: 98.3 F (36.8 C) (02/20 2111) Temp Source: Oral (02/20 2111) BP: 132/80 (02/20 2111) Pulse Rate: 74 (02/20 2111)  Labs:  Recent Labs  09/15/16 1618 09/16/16 0153  HGB 11.1*  --   HCT 34.0*  --   PLT 255  --   APTT 54*  --   LABPROT 22.4*  --   INR 1.94  --   HEPARINUNFRC  --  0.76*  CREATININE 8.95*  --     Estimated Creatinine Clearance: 10 mL/min (by C-G formula based on SCr of 8.95 mg/dL (H)).   Medical History: Past Medical History:  Diagnosis Date  . Anemia   . Antral ulcer 2014   small  . BACK PAIN, LUMBAR, CHRONIC   . BENIGN PROSTATIC HYPERTROPHY   . CEREBROVASCULAR ACCIDENT, HX OF   . CHOLELITHIASIS   . Chronic combined systolic and diastolic CHF (congestive heart failure) ([place])   . Complication of anesthesia    wife states pt had trouble waking up with his last surgery in Nov., 2014  . DEPRESSION   . DIABETES MELLITUS, TYPE II   . ERECTILE DYSFUNCTION   . ESRD on hemodialysis ([place])    ESRD due to DM/HTN. Started dialysis in November 2013.  HD TTS at [facility] on [address].  [name] GERD   . Hemorrhoids   . HEPATITIS C, HX OF   . History of Clostridium difficile   . HYPERTENSION   . LBBB (left bundle branch block)   . Morbid obesity ([place])   . NEPHROLITHIASIS, HX OF   . PAF (paroxysmal atrial fibrillation) ([place])    a. Dx 12/2015.  . S/P bilateral BKA (below knee amputation) ([facility])     Assessment:  66 yr male on warfarin PTA for AFib.  Was scheduled to undergo EGD this AM and warfarin has been on hold and patient on Lovenox 100mg  sq q24h as bridge therapy.  EGD unable to be accomplished today and rescheduled for 2/21.  Pharmacy has been consulted to dose IV heparin until EGD on 2/21  Per patient, last dose of  Lovenox 100mg  was administered on 09/14/16 @ 20:00.  Due to renal function  Today, 2/21 0153 HL=0.76 > slightly above goal, no infusion or bleeding issues per RN Lovenox may still be hanging around since renal function so poor? Goal of Therapy:  Heparin level 0.3-0.7 units/ml Monitor platelets by anticoagulation protocol: Yes   Plan:   Decrease heparin drip to 1100 units/hr  Recheck HL 8 hours after drip decreased  Check AM CBC  Dorrene German, PharmD 09/16/2016,2:30 AM

## 2016-09-16 NOTE — Transfer of Care (Signed)
Immediate Anesthesia Transfer of Care Note  Patient: NAHIEM DREDGE  Procedure(s) Performed: Procedure(s): COLONOSCOPY (N/A) ESOPHAGOGASTRODUODENOSCOPY (EGD) WITH PROPOFOL (N/A)  Patient Location: PACU and Endoscopy Unit  Anesthesia Type:MAC  Level of Consciousness: awake, alert , oriented and patient cooperative  Airway & Oxygen Therapy: Patient Spontanous Breathing and Patient connected to nasal cannula oxygen  Post-op Assessment: Report given to RN, Post -op Vital signs reviewed and stable and Patient moving all extremities  Post vital signs: Reviewed and stable  Last Vitals:  Vitals:   09/16/16 0922 09/16/16 1303  BP: (!) 158/87 (!) 186/92  Pulse: 69 67  Resp:  14  Temp:  36.8 C    Last Pain:  Vitals:   09/16/16 1303  TempSrc: Oral         Complications: No apparent anesthesia complications

## 2016-09-16 NOTE — Op Note (Signed)
[facility] Patient Name: Oscar Castillo Procedure Date: 09/16/2016 MRN: [id number] Attending MD: Jerene Bears , MD Date of Birth: [birth year] CSN: [id number] Age: 66 Admit Type: Inpatient Procedure:                Colonoscopy Indications:              Heme positive stool, rectal pain, diarrhea Providers:                Lajuan Lines. Hilarie Fredrickson, MD, Carolynn Comment, RN, Corliss Parish, Technician Referring MD:             [name], MD Medicines:                Monitored Anesthesia Care Complications:            No immediate complications. Estimated Blood Loss:     Estimated blood loss was minimal. Procedure:                Pre-Anesthesia Assessment:                           - Prior to the procedure, a History and Physical                            was performed, and patient medications and                            allergies were reviewed. The patient's tolerance of                            previous anesthesia was also reviewed. The risks                            and benefits of the procedure and the sedation                            options and risks were discussed with the patient.                            All questions were answered, and informed consent                            was obtained. Prior Anticoagulants: The patient has                            taken Coumadin (warfarin), last dose was 6 days                            prior to procedure. ASA Grade Assessment: III - A                            patient with severe systemic disease. After  reviewing the risks and benefits, the patient was                            deemed in satisfactory condition to undergo the                            procedure.                           After obtaining informed consent, the colonoscope                            was passed under direct vision. Throughout the                            procedure, the patient's  blood pressure, pulse, and                            oxygen saturations were monitored continuously. The                            [id number]) scope was introduced through                            the anus and advanced to the the terminal ileum.                            The colonoscopy was performed without difficulty.                            The patient tolerated the procedure well. The                            quality of the bowel preparation was fair. The                            terminal ileum, ileocecal valve, appendiceal                            orifice, and rectum were photographed. Scope In: 2:14:12 PM Scope Out: 2:35:29 PM Scope Withdrawal Time: 0 hours 12 minutes 44 seconds  Total Procedure Duration: 0 hours 21 minutes 17 seconds  Findings:      The digital rectal exam findings include hard, very dark stool (without       the typical appearance or odor for melena/heme) which was removed       digitally. Pertinent negatives include intact sphincter tone, no       evidence of mass, stricture or fissure.      The terminal ileum appeared normal.      A 4 mm polyp was found in the descending colon. The polyp was sessile.       The polyp was removed with a cold biopsy forceps. Resection and       retrieval were complete.      A localized area of moderately erythematous mucosa was found in the  distal rectum likely related to pressure injury from hard, impacted       stool.      Retroflexion in the rectum was not performed due to anatomy.      There is no endoscopic evidence of bleeding in the entire colon or       terminal ileum. Impression:               - Preparation of the colon was fair clearing to                            adequate with copious irrigation and lavage.                           - Hard, very dark stool found and removed on                            digital rectal exam.                           - The examined portion of the ileum  was normal.                           - One 4 mm polyp in the descending colon, removed                            with a cold biopsy forceps. Resected and retrieved.                           - Erythematous mucosa in the distal rectum likely                            related to constipation/hard stool. Expect diarrhea                            is related to overflow in the setting of                            constipation. Rectal pain also most likely related                            to constipation. Moderate Sedation:      N/A Recommendation:           - Patient has a contact number available for                            emergencies. The signs and symptoms of potential                            delayed complications were discussed with the                            patient. Return to normal activities tomorrow.  Written discharge instructions were provided to the                            patient.                           - Resume previous diet.                           - Continue present medications. T                           - Begin MiraLax 17 g daily to prevent constipation                            and in doing so, I expect rectal pain to improve                           - Await pathology results.                           - Repeat colonoscopy is recommended. The                            colonoscopy date will be determined after pathology                            results from today's exam become available for                            review. Procedure Code(s):        --- Professional ---                           [phone], Colonoscopy, flexible; with biopsy, single                            or multiple Diagnosis Code(s):        --- Professional ---                           D12.4, Benign neoplasm of descending colon                           K62.89, Other specified diseases of anus and rectum                           R19.5, Other  fecal abnormalities CPT copyright 2016 [employer]. All rights reserved. The codes documented in this report are preliminary and upon coder review may  be revised to meet current compliance requirements. Jerene Bears, MD 09/16/2016 2:48:22 PM This report has been signed electronically. Number of Addenda: 0

## 2016-09-16 NOTE — Care Management Note (Signed)
Case Management Note  Patient Details  Name: Oscar Castillo MRN: [id number] Date of Birth: 1951-02-27  Subjective/Objective:  66 y/o m admitted w/Blood loss anemia. Hx: Bilat BKA.From home.Tap water enema for colonoscopy today.                   Action/Plan:d/c plan home.   Expected Discharge Date:                  Expected Discharge Plan:  Home/Self Care  In-House Referral:     Discharge planning Services  CM Consult  Post Acute Care Choice:    Choice offered to:     DME Arranged:    DME Agency:     HH Arranged:    HH Agency:     Status of Service:  In process, will continue to follow  If discussed at Long Length of Stay Meetings, dates discussed:    Additional Comments:  Dessa Phi, RN 09/16/2016, 1:09 PM

## 2016-09-16 NOTE — Care Management Note (Signed)
Case Management Note  Patient Details  Name: Oscar Castillo MRN: [id number] Date of Birth: 02-27-1951  Subjective/Objective:                    Action/Plan: D/c home.  Expected Discharge Date:  09/16/16               Expected Discharge Plan:  Home/Self Care  In-House Referral:     Discharge planning Services  CM Consult  Post Acute Care Choice:    Choice offered to:     DME Arranged:    DME Agency:     HH Arranged:    HH Agency:     Status of Service:  Completed, signed off  If discussed at [employer] of Stay Meetings, dates discussed:    Additional Comments:  Dessa Phi, RN 09/16/2016, 4:13 PM

## 2016-09-16 NOTE — Anesthesia Postprocedure Evaluation (Signed)
Anesthesia Post Note  Patient: Oscar Castillo  Procedure(s) Performed: Procedure(s) (LRB): COLONOSCOPY (N/A) ESOPHAGOGASTRODUODENOSCOPY (EGD) WITH PROPOFOL (N/A)  Patient location during evaluation: PACU Anesthesia Type: MAC Level of consciousness: awake and alert Pain management: pain level controlled Vital Signs Assessment: post-procedure vital signs reviewed and stable Respiratory status: spontaneous breathing, nonlabored ventilation, respiratory function stable and patient connected to nasal cannula oxygen Cardiovascular status: stable and blood pressure returned to baseline Anesthetic complications: no       Last Vitals:  Vitals:   09/16/16 1450 09/16/16 1500  BP: (!) 158/76 (!) 156/83  Pulse: 77 72  Resp: 20 14  Temp: 36.4 C     Last Pain:  Vitals:   09/16/16 1450  TempSrc: Axillary                 Tiajuana Amass

## 2016-09-16 NOTE — Anesthesia Preprocedure Evaluation (Addendum)
Anesthesia Evaluation  Patient identified by MRN, date of birth, ID band Patient awake    Reviewed: Allergy & Precautions, NPO status , Patient's Chart, lab work & pertinent test results  History of Anesthesia Complications (+) PROLONGED EMERGENCE and history of anesthetic complications  Airway Mallampati: II  TM Distance: >3 FB Neck ROM: Full    Dental  (+) Edentulous Upper, Edentulous Lower   Pulmonary neg pulmonary ROS,    Pulmonary exam normal breath sounds clear to auscultation       Cardiovascular hypertension, Pt. on medications + Peripheral Vascular Disease and +CHF  + dysrhythmias Atrial Fibrillation  Rhythm:Irregular Rate:Normal  LVEF 45% PVD- S/P Bilateral BKA LBBB   Neuro/Psych PSYCHIATRIC DISORDERS Depression TIACVA, Residual Symptoms    GI/Hepatic PUD, GERD  Controlled and Medicated,(+) Hepatitis -, CMelena  Diarrhea Rectal pain   Endo/Other  diabetes, Poorly Controlled, Type 2Obesity Hyperlipidemia  Renal/GU ESRF and DialysisRenal disease   ED BPH    Musculoskeletal Chronic LBP   Abdominal   Peds  Hematology  (+) anemia ,   Anesthesia Other Findings   Reproductive/Obstetrics                               Chemistry      Component Value Date/Time   NA 136 09/15/2016 1618   K 4.4 09/15/2016 1618   CL 96 (L) 09/15/2016 1618   CO2 29 09/15/2016 1618   BUN 54 (H) 09/15/2016 1618   CREATININE 8.95 (H) 09/15/2016 1618   CREATININE 8.97 (H) 10/07/2015 1500      Component Value Date/Time   CALCIUM 9.2 09/15/2016 1618   ALKPHOS 52 08/04/2016 2335   AST 14 (L) 08/04/2016 2335   ALT 9 (L) 08/04/2016 2335   BILITOT 0.3 08/04/2016 2335     Lab Results  Component Value Date   WBC 5.5 09/15/2016   HGB 11.1 (L) 09/15/2016   HCT 34.0 (L) 09/15/2016   MCV 84.6 09/15/2016   PLT 255 09/15/2016   EKG: normal sinus rhythm, LBBB. Echo 12/2015: Left ventricle: The cavity  size was normal. There was mild   concentric hypertrophy. Systolic function was normal. The   estimated ejection fraction was in the range of 50% to 55%. Wall   motion was normal; there were no regional wall motion   abnormalities. The study was not technically sufficient to allow   evaluation of LV diastolic dysfunction due to atrial   fibrillation. - Ventricular septum: Septal motion showed moderate paradox. These   changes are consistent with intraventricular conduction delay. - Aortic valve: Severe diffuse thickening and calcification. Valve   mobility was mildly restricted. - Mitral valve: There was mild regurgitation. Anesthesia Physical Anesthesia Plan  ASA: IV  Anesthesia Plan: MAC   Post-op Pain Management:    Induction: Intravenous  Airway Management Planned: Natural Airway and Nasal Cannula  Additional Equipment:   Intra-op Plan:   Post-operative Plan:   Informed Consent: I have reviewed the patients History and Physical, chart, labs and discussed the procedure including the risks, benefits and alternatives for the proposed anesthesia with the patient or authorized representative who has indicated his/her understanding and acceptance.     Plan Discussed with: CRNA, Anesthesiologist and Surgeon  Anesthesia Plan Comments:         Anesthesia Quick Evaluation

## 2016-09-16 NOTE — Discharge Summary (Signed)
Physician Discharge Summary  Oscar Castillo [id number] DOB: 1951-07-10 DOA: 09/15/2016  PCP: [name], MD  Admit date: 09/15/2016 Discharge date: 09/16/2016  Time spent: 35 minutes  Recommendations for Outpatient Follow-up:  Repeat CBC to follow Hgb trend  Repeat BMET to follow electrolytes  Follow INR and adjust coumadin clinic as needed   Discharge Diagnoses:  Active Problems:   Essential hypertension   TIA (transient ischemic attack)   ESRD on hemodialysis (HCC)   S/P BKA (below knee amputation) bilateral (HCC)   Blood loss anemia   Atrial fibrillation (HCC)   Heme positive stool   Benign neoplasm of descending colon   Gastroesophageal reflux disease with esophagitis   Duodenal ulcer without hemorrhage or perforation   Discharge Condition: stable and improved. Discharge home with instructions to follow up with PCP and GI service as an outpatient.  Diet recommendation: modified carbohydrates and heart healthy diet   Filed Weights   09/15/16 1500 09/16/16 1303  Weight: 104.3 kg (230 lb) 104.3 kg (230 lb)    History of present illness:  66 y.o. male with medical history of systolic and diastolic CHF, atrial fibrillation on coumadin, stroke, diabetes mellitus type 2, ESRD (Tues-Th-Sat), PVD,  hypertension, bilateral BKA presented to the endoscopy unit on the morning of 09/15/2016 for EGD and colonoscopy. Unfortunately, the patient's prep was inadequate and will require additional prepping. Unfortunately, the patient has poor mobility and difficulty prepping at home due to his BKA status as well as some generalized weakness. As result, the patient will be directly admitted for observation for GI prep for repeat EGD and colonoscopy on 09/16/2016 tentatively scheduled at 1:30 PM. The patient was recently seen at [place] office on 08/20/16 for rectal pain, diarrhea, heme positive stool.   Hospital Course:  Heme positive stool/Rectal pain -EGD/colonoscopy performed on  09/16/2016 after repeat bowel prep  -no active bleeding seen; positive duodenal ulcer appreciated on EGD and some changes in his rectum from hard stool/constipation. -will discharge on PPI twice a day and also twice a day Miralax. -ok to resume anticoagulation with coumadin on 09/17/16 -outpatient follow up with GI service  Chronic Atrial fibrillation -Rate controlled -INR 1.94 -restart coumadin on 2/22 -CHADsVASC score 5  ESRD -pt on HD T-T-S -continue phosphate binders -next HD 09/17/16 -follow up with renal service as an outpatient   Diabetes mellitus type 2 -continue home hypoglycemic regimen  -advise to monitor carbohydrates intake  Anemia of CKD: with positive FOBT -baseline Hgb 11-12 -Hgb at discharge 11.1 -non-actively bleeding duodenal ulcer seen on EGD -continue iron supplementation -repeat CBC at follow up visit   Chronic systolic and diastolic CHF -clinically compensated -daily weight and low sodium diet encouraged -12/27/2015 Echo EF 50-55%, no WMA,severe AV thickening without AS/AI, mild MR -continue volume management with HD  HTN -continue home meds--hydralazine, metoprolol succinate -advise to follow heart healthy diet   Hyperlipidemia -continue statin  Procedures:  EGD and Colonoscopy: 09/16/16 See reports for details. But essentially non-actively bleeding duodenal ulcer appreciated.  Consultations:  GI  Discharge Exam: Vitals:   09/16/16 1450 09/16/16 1500  BP: (!) 158/76 (!) 156/83  Pulse: 77 72  Resp: 20 14  Temp: 97.6 F (36.4 C)     General: afebrile, in no distress. Patient denies abd pain, nausea and vomiting. Reports some pain in rectum with defecation. No melanotic stools or hematochezia reported by nursing staff during bowel prep. Cardiovascular: rate controlled, no murmurs, no gallops Respiratory: CTA bilaterally Extremities: bilateral BKA Abd: soft,  NT, positive BS  Discharge Instructions   Discharge Instructions     Diet - low sodium heart healthy    Complete by:  As directed    Discharge instructions    Complete by:  As directed    Take medications as prescribed  Follow up with GI as instructed Increase fiber intake and maintain adequate hydration  Arrange follow up with PCP in 1 week Follow up with coumadin clinic in 3 days Resume coumadin on 09/17/16     Current Discharge Medication List    START taking these medications   Details  pantoprazole (PROTONIX) 40 MG tablet Take 1 tablet (40 mg total) by mouth 2 (two) times daily before a meal. Qty: 60 tablet, Refills: 1      CONTINUE these medications which have CHANGED   Details  ketoconazole (NIZORAL) 2 % cream Apply 1 application topically daily as needed for irritation.    polyethylene glycol (MIRALAX / GLYCOLAX) packet Take 17 g by mouth 2 (two) times daily. Qty: 28 each, Refills: 0    silver sulfADIAZINE (SILVADENE) 1 % cream Apply 1 application topically daily as needed (irratation).    warfarin (COUMADIN) 2.5 MG tablet Take 3.75 Mg daily, except for Sundays when you will take 2.5 Mg Qty: 30 tablet, Refills: 1      CONTINUE these medications which have NOT CHANGED   Details  acetaminophen (TYLENOL) 500 MG tablet Take 500 mg by mouth every 6 (six) hours as needed for mild pain.    atorvastatin (LIPITOR) 40 MG tablet TAKE 1 TABLET(40 MG) BY MOUTH DAILY AT 6 PM Qty: 30 tablet, Refills: 3    calcium acetate (PHOSLO) 667 MG capsule Take 2,001 mg by mouth 3 (three) times daily with meals.    cyclobenzaprine (FLEXERIL) 5 MG tablet Take 1 tablet (5 mg total) by mouth 3 (three) times daily as needed for muscle spasms. Qty: 60 tablet, Refills: 1    diltiazem (CARDIZEM CD) 120 MG 24 hr capsule Take 1 capsule (120 mg total) by mouth daily. Qty: 90 capsule, Refills: 3    hydrALAZINE (APRESOLINE) 100 MG tablet TAKE 1 TABLET BY MOUTH EVERY 8 HOURS Qty: 90 tablet, Refills: 1    HYDROcodone-acetaminophen (NORCO/VICODIN) 5-325 MG tablet  Take 1 tablet by mouth every 6 (six) hours as needed for moderate pain.    hydrOXYzine (ATARAX/VISTARIL) 25 MG tablet TAKE 1 TABLET(25 MG) BY MOUTH EVERY 6 HOURS AS NEEDED Qty: 40 tablet, Refills: 0    latanoprost (XALATAN) 0.005 % ophthalmic solution Place 1 drop into both eyes at bedtime.     lidocaine-prilocaine (EMLA) cream APPLY A SMALL AMOUNT TO SKIN AT THE ACCESS SITE (AVF) AS DIRECTED BEFORE EACH DIALYSIS SESSION THREE DAYS A WEEK Refills: 6    metoprolol succinate (TOPROL-XL) 100 MG 24 hr tablet Take 1 tablet (100 mg total) by mouth daily. Take with or immediately following a meal. Qty: 90 tablet, Refills: 3    ondansetron (ZOFRAN ODT) 4 MG disintegrating tablet 17m ODT q4 hours prn nausea/vomit Qty: 15 tablet, Refills: 0    QUEtiapine (SEROQUEL) 25 MG tablet Take 1 tablet (25 mg total) by mouth 2 (two) times daily. Qty: 60 tablet, Refills: 5    SENSIPAR 30 MG tablet Take 30 mg by mouth every evening.     sucroferric oxyhydroxide (VELPHORO) 500 MG chewable tablet Chew 500 mg by mouth 3 (three) times daily with meals.    traMADol (ULTRAM) 50 MG tablet Take 1 tablet (50 mg total) by mouth  every 8 (eight) hours as needed. Qty: 90 tablet, Refills: 1    triamcinolone cream (KENALOG) 0.1 % Apply 1 application topically as needed (irritation).  Refills: 2    VOLTAREN 1 % GEL APPLY 2 GRAMS TO HANDS BID TO TID AS NEEDED FOR PAIN. Refills: 0      STOP taking these medications     enoxaparin (LOVENOX) 100 MG/ML injection      loperamide (IMODIUM) 2 MG capsule      Na Sulfate-K Sulfate-Mg Sulf (SUPREP BOWEL PREP KIT) 17.5-3.13-1.6 GM/180ML SOLN        No Known Allergies Follow-up Information    Levin Erp, PA Follow up on 10/12/2016.   Specialty:  Gastroenterology Why:  9:45 am Contact information: [address] [place] [address] [phone]        [name], MD. Schedule an appointment as soon as possible for a visit in 1 week(s).    Specialties:  Internal Medicine, Radiology Contact information: [address] [place] [address] [phone]           The results of significant diagnostics from this hospitalization (including imaging, microbiology, ancillary and laboratory) are listed below for reference.     Labs: Basic Metabolic Panel:  Recent Labs Lab 09/15/16 1618  NA 136  K 4.4  CL 96*  CO2 29  GLUCOSE 142*  BUN 54*  CREATININE 8.95*  CALCIUM 9.2   CBC:  Recent Labs Lab 09/15/16 1618  WBC 5.5  HGB 11.1*  HCT 34.0*  MCV 84.6  PLT 255   CBG:  Recent Labs Lab 09/15/16 1730 09/16/16 0048 09/16/16 0745 09/16/16 1202  GLUCAP 159* 113* 100* 107*    Signed:  Barton Dubois MD.  [facility] 09/16/2016, 3:45 PM

## 2016-09-16 NOTE — Progress Notes (Signed)
Patient for EGD and colonoscopy today at 1330 with Dr. Hilarie Fredrickson.  Orders to hold heparin were just placed and patient's nurse turned off the drip.  Had several BM's overnight but still passing dark liquid stool.  Dr. Hilarie Fredrickson requesting tap water enemas until clear from bottom.

## 2016-09-16 NOTE — Care Management Obs Status (Signed)
[place] NOTIFICATION   Patient Details  Name: Oscar Castillo MRN: [id number] Date of Birth: 12-12-1950   Medicare Observation Status Notification Given:  Yes    MahabirJuliann Pulse, RN 09/16/2016, 1:09 PM

## 2016-09-16 NOTE — Op Note (Signed)
[facility] Patient Name: Oscar Castillo Procedure Date: 09/16/2016 MRN: [id number] Attending MD: Jerene Bears , MD Date of Birth: 1950/09/11 CSN: [id number] Age: 66 Admit Type: Inpatient Procedure:                Upper GI endoscopy Indications:              Heme positive stool Providers:                Lajuan Lines. Hilarie Fredrickson, MD, Carolynn Comment, RN, Corliss Parish, Technician Referring MD:             [name], MD Medicines:                Monitored Anesthesia Care Complications:            No immediate complications. Estimated Blood Loss:     Estimated blood loss was minimal. Procedure:                Pre-Anesthesia Assessment:                           - Prior to the procedure, a History and Physical                            was performed, and patient medications and                            allergies were reviewed. The patient's tolerance of                            previous anesthesia was also reviewed. The risks                            and benefits of the procedure and the sedation                            options and risks were discussed with the patient.                            All questions were answered, and informed consent                            was obtained. Prior Anticoagulants: The patient has                            taken Coumadin (warfarin), last dose was 6 days                            prior to procedure. ASA Grade Assessment: III - A                            patient with severe systemic disease. After  reviewing the risks and benefits, the patient was                            deemed in satisfactory condition to undergo the                            procedure.                           After obtaining informed consent, the endoscope was                            passed under direct vision. Throughout the                            procedure, the patient's blood pressure,  pulse, and                            oxygen saturations were monitored continuously. The                            [id number]) scope was introduced through the                            mouth, and advanced to the second part of duodenum.                            The upper GI endoscopy was accomplished without                            difficulty. The patient tolerated the procedure                            well. Scope In: Scope Out: Findings:      LA Grade A (one or more mucosal breaks less than 5 mm, not extending       between tops of 2 mucosal folds) esophagitis with no bleeding was found       at the gastroesophageal junction.      A 2 cm hiatal hernia was present.      Scattered moderate inflammation characterized by erosions and erythema       was found in the gastric body and in the gastric antrum. Biopsies were       taken with a cold forceps for histology and Helicobacter pylori testing.      Moderate inflammation characterized by erythema and shallow ulcerations       was found in the duodenal bulb. Impression:               - LA Grade A reflux esophagitis.                           - 2 cm hiatal hernia.                           - Acute gastritis. Biopsied.                           -  Duodenitis. Moderate Sedation:      N/A Recommendation:           - Patient has a contact number available for                            emergencies. The signs and symptoms of potential                            delayed complications were discussed with the                            patient. Return to normal activities tomorrow.                            Written discharge instructions were provided to the                            patient.                           - Resume previous diet.                           - Continue present medications. BID PPI.                           - No ibuprofen, naproxen, or other non-steroidal                            anti-inflammatory  drugs.                           - Perform a colonoscopy today.                           - See the other procedure note for documentation of                            additional recommendations.                           - Await pathology results. Procedure Code(s):        --- Professional ---                           [id number], Esophagogastroduodenoscopy, flexible,                            transoral; with biopsy, single or multiple Diagnosis Code(s):        --- Professional ---                           K21.0, Gastro-esophageal reflux disease with                            esophagitis  K44.9, Diaphragmatic hernia without obstruction or                            gangrene                           K29.00, Acute gastritis without bleeding                           K29.80, Duodenitis without bleeding                           R19.5, Other fecal abnormalities CPT copyright 2016 [employer]. All rights reserved. The codes documented in this report are preliminary and upon coder review may  be revised to meet current compliance requirements. Jerene Bears, MD 09/16/2016 2:39:21 PM This report has been signed electronically. Number of Addenda: 0

## 2016-09-17 ENCOUNTER — Telehealth: Payer: Self-pay

## 2016-09-17 ENCOUNTER — Telehealth: Payer: Self-pay | Admitting: Internal Medicine

## 2016-09-17 DIAGNOSIS — Z5181 Encounter for therapeutic drug level monitoring: Secondary | ICD-10-CM | POA: Diagnosis not present

## 2016-09-17 DIAGNOSIS — I4891 Unspecified atrial fibrillation: Secondary | ICD-10-CM | POA: Diagnosis not present

## 2016-09-17 DIAGNOSIS — N2581 Secondary hyperparathyroidism of renal origin: Secondary | ICD-10-CM | POA: Diagnosis not present

## 2016-09-17 DIAGNOSIS — N186 End stage renal disease: Secondary | ICD-10-CM | POA: Diagnosis not present

## 2016-09-17 DIAGNOSIS — Z23 Encounter for immunization: Secondary | ICD-10-CM | POA: Diagnosis not present

## 2016-09-17 DIAGNOSIS — E1122 Type 2 diabetes mellitus with diabetic chronic kidney disease: Secondary | ICD-10-CM | POA: Diagnosis not present

## 2016-09-17 LAB — HEMOGLOBIN A1C
Hgb A1c MFr Bld: 6.4 % — ABNORMAL HIGH (ref 4.8–5.6)
Mean Plasma Glucose: 137

## 2016-09-17 NOTE — Telephone Encounter (Signed)
Pt is on TCM list for dark colored stool and colonoscopy procedure per GI.   DC'ed on 09/16/2016.   To follow up with GI.

## 2016-09-17 NOTE — Telephone Encounter (Signed)
I called pharmacy, verified rx given to pt at discharge yesterday from GI MD was for 2.5mg  tablets not 1.5mg  tablets, instructions were to take 1.5 tablets daily except 1 tablet on Sundays as previously rx [facility].   Returned call to pt's wife, advised of above.  Advised to continue taking 2.5mg  tablet of Coumadin as instructed by [facility] aka Dr Alan Ripper office.

## 2016-09-17 NOTE — Telephone Encounter (Signed)
New Message:    Pt had a Colonoscopy yesterday. His GI doctor gave him a prescription for Coumadin 1.5 mg and Dr Harrington Challenger have him on Coumadin 2.5 mg. The pharmacist told her to check with Dr Harrington Challenger to see which one pt is supposed to take.

## 2016-09-17 NOTE — Telephone Encounter (Signed)
Will route this to coumadin clinic.  Pt is followed here for anticoagulation.

## 2016-09-18 ENCOUNTER — Encounter (HOSPITAL_COMMUNITY): Payer: Self-pay | Admitting: Internal Medicine

## 2016-09-18 ENCOUNTER — Encounter: Payer: Self-pay | Admitting: Internal Medicine

## 2016-09-19 DIAGNOSIS — N186 End stage renal disease: Secondary | ICD-10-CM | POA: Diagnosis not present

## 2016-09-19 DIAGNOSIS — Z23 Encounter for immunization: Secondary | ICD-10-CM | POA: Diagnosis not present

## 2016-09-19 DIAGNOSIS — E1122 Type 2 diabetes mellitus with diabetic chronic kidney disease: Secondary | ICD-10-CM | POA: Diagnosis not present

## 2016-09-19 DIAGNOSIS — N2581 Secondary hyperparathyroidism of renal origin: Secondary | ICD-10-CM | POA: Diagnosis not present

## 2016-09-21 ENCOUNTER — Ambulatory Visit (INDEPENDENT_AMBULATORY_CARE_PROVIDER_SITE_OTHER): Payer: Medicare Other | Admitting: *Deleted

## 2016-09-21 DIAGNOSIS — G459 Transient cerebral ischemic attack, unspecified: Secondary | ICD-10-CM

## 2016-09-21 DIAGNOSIS — I4891 Unspecified atrial fibrillation: Secondary | ICD-10-CM

## 2016-09-21 DIAGNOSIS — I638 Other cerebral infarction: Secondary | ICD-10-CM

## 2016-09-21 LAB — POCT INR: INR: 1.7

## 2016-09-22 DIAGNOSIS — N186 End stage renal disease: Secondary | ICD-10-CM | POA: Diagnosis not present

## 2016-09-22 DIAGNOSIS — E1122 Type 2 diabetes mellitus with diabetic chronic kidney disease: Secondary | ICD-10-CM | POA: Diagnosis not present

## 2016-09-22 DIAGNOSIS — Z23 Encounter for immunization: Secondary | ICD-10-CM | POA: Diagnosis not present

## 2016-09-22 DIAGNOSIS — N2581 Secondary hyperparathyroidism of renal origin: Secondary | ICD-10-CM | POA: Diagnosis not present

## 2016-09-23 ENCOUNTER — Ambulatory Visit (INDEPENDENT_AMBULATORY_CARE_PROVIDER_SITE_OTHER): Payer: Medicare Other | Admitting: *Deleted

## 2016-09-23 DIAGNOSIS — N186 End stage renal disease: Secondary | ICD-10-CM | POA: Diagnosis not present

## 2016-09-23 DIAGNOSIS — I638 Other cerebral infarction: Secondary | ICD-10-CM

## 2016-09-23 DIAGNOSIS — I4891 Unspecified atrial fibrillation: Secondary | ICD-10-CM | POA: Diagnosis not present

## 2016-09-23 DIAGNOSIS — Z992 Dependence on renal dialysis: Secondary | ICD-10-CM | POA: Diagnosis not present

## 2016-09-23 DIAGNOSIS — G459 Transient cerebral ischemic attack, unspecified: Secondary | ICD-10-CM

## 2016-09-23 DIAGNOSIS — E1122 Type 2 diabetes mellitus with diabetic chronic kidney disease: Secondary | ICD-10-CM | POA: Diagnosis not present

## 2016-09-23 LAB — POCT INR: INR: 3.3

## 2016-09-24 DIAGNOSIS — N186 End stage renal disease: Secondary | ICD-10-CM | POA: Diagnosis not present

## 2016-09-24 DIAGNOSIS — D631 Anemia in chronic kidney disease: Secondary | ICD-10-CM | POA: Diagnosis not present

## 2016-09-24 DIAGNOSIS — E1122 Type 2 diabetes mellitus with diabetic chronic kidney disease: Secondary | ICD-10-CM | POA: Diagnosis not present

## 2016-09-24 DIAGNOSIS — N2581 Secondary hyperparathyroidism of renal origin: Secondary | ICD-10-CM | POA: Diagnosis not present

## 2016-09-26 ENCOUNTER — Encounter (HOSPITAL_COMMUNITY): Payer: Self-pay

## 2016-09-26 ENCOUNTER — Inpatient Hospital Stay (HOSPITAL_COMMUNITY)
Admission: EM | Admit: 2016-09-26 | Discharge: 2016-10-02 | DRG: 377 | Disposition: A | Discharge status: Home Health | Payer: Medicare Other | Attending: Internal Medicine | Admitting: Internal Medicine

## 2016-09-26 DIAGNOSIS — R1111 Vomiting without nausea: Secondary | ICD-10-CM | POA: Diagnosis not present

## 2016-09-26 DIAGNOSIS — I10 Essential (primary) hypertension: Secondary | ICD-10-CM | POA: Diagnosis not present

## 2016-09-26 DIAGNOSIS — I959 Hypotension, unspecified: Secondary | ICD-10-CM | POA: Diagnosis not present

## 2016-09-26 DIAGNOSIS — I4891 Unspecified atrial fibrillation: Secondary | ICD-10-CM | POA: Diagnosis not present

## 2016-09-26 DIAGNOSIS — L8990 Pressure ulcer of unspecified site, unspecified stage: Secondary | ICD-10-CM | POA: Diagnosis not present

## 2016-09-26 DIAGNOSIS — Z79899 Other long term (current) drug therapy: Secondary | ICD-10-CM | POA: Diagnosis not present

## 2016-09-26 DIAGNOSIS — I447 Left bundle-branch block, unspecified: Secondary | ICD-10-CM | POA: Diagnosis present

## 2016-09-26 DIAGNOSIS — Z8673 Personal history of transient ischemic attack (TIA), and cerebral infarction without residual deficits: Secondary | ICD-10-CM | POA: Diagnosis not present

## 2016-09-26 DIAGNOSIS — E118 Type 2 diabetes mellitus with unspecified complications: Secondary | ICD-10-CM | POA: Diagnosis not present

## 2016-09-26 DIAGNOSIS — N185 Chronic kidney disease, stage 5: Secondary | ICD-10-CM | POA: Diagnosis not present

## 2016-09-26 DIAGNOSIS — I480 Paroxysmal atrial fibrillation: Secondary | ICD-10-CM | POA: Diagnosis not present

## 2016-09-26 DIAGNOSIS — Z89511 Acquired absence of right leg below knee: Secondary | ICD-10-CM | POA: Diagnosis not present

## 2016-09-26 DIAGNOSIS — E784 Other hyperlipidemia: Secondary | ICD-10-CM | POA: Diagnosis not present

## 2016-09-26 DIAGNOSIS — E1022 Type 1 diabetes mellitus with diabetic chronic kidney disease: Secondary | ICD-10-CM | POA: Diagnosis not present

## 2016-09-26 DIAGNOSIS — M898X9 Other specified disorders of bone, unspecified site: Secondary | ICD-10-CM | POA: Diagnosis present

## 2016-09-26 DIAGNOSIS — B1920 Unspecified viral hepatitis C without hepatic coma: Secondary | ICD-10-CM | POA: Diagnosis present

## 2016-09-26 DIAGNOSIS — K921 Melena: Secondary | ICD-10-CM | POA: Diagnosis present

## 2016-09-26 DIAGNOSIS — Z89512 Acquired absence of left leg below knee: Secondary | ICD-10-CM

## 2016-09-26 DIAGNOSIS — Z794 Long term (current) use of insulin: Secondary | ICD-10-CM

## 2016-09-26 DIAGNOSIS — N2581 Secondary hyperparathyroidism of renal origin: Secondary | ICD-10-CM | POA: Diagnosis present

## 2016-09-26 DIAGNOSIS — Z7901 Long term (current) use of anticoagulants: Secondary | ICD-10-CM

## 2016-09-26 DIAGNOSIS — E785 Hyperlipidemia, unspecified: Secondary | ICD-10-CM | POA: Diagnosis present

## 2016-09-26 DIAGNOSIS — I110 Hypertensive heart disease with heart failure: Secondary | ICD-10-CM | POA: Diagnosis present

## 2016-09-26 DIAGNOSIS — D649 Anemia, unspecified: Secondary | ICD-10-CM | POA: Diagnosis not present

## 2016-09-26 DIAGNOSIS — L89329 Pressure ulcer of left buttock, unspecified stage: Secondary | ICD-10-CM | POA: Diagnosis present

## 2016-09-26 DIAGNOSIS — D500 Iron deficiency anemia secondary to blood loss (chronic): Secondary | ICD-10-CM | POA: Diagnosis not present

## 2016-09-26 DIAGNOSIS — E1122 Type 2 diabetes mellitus with diabetic chronic kidney disease: Secondary | ICD-10-CM | POA: Diagnosis present

## 2016-09-26 DIAGNOSIS — D62 Acute posthemorrhagic anemia: Secondary | ICD-10-CM | POA: Diagnosis present

## 2016-09-26 DIAGNOSIS — D631 Anemia in chronic kidney disease: Secondary | ICD-10-CM | POA: Diagnosis present

## 2016-09-26 DIAGNOSIS — I132 Hypertensive heart and chronic kidney disease with heart failure and with stage 5 chronic kidney disease, or end stage renal disease: Secondary | ICD-10-CM | POA: Diagnosis present

## 2016-09-26 DIAGNOSIS — K2971 Gastritis, unspecified, with bleeding: Secondary | ICD-10-CM | POA: Diagnosis not present

## 2016-09-26 DIAGNOSIS — I9589 Other hypotension: Secondary | ICD-10-CM | POA: Diagnosis not present

## 2016-09-26 DIAGNOSIS — K210 Gastro-esophageal reflux disease with esophagitis: Secondary | ICD-10-CM | POA: Diagnosis present

## 2016-09-26 DIAGNOSIS — K920 Hematemesis: Secondary | ICD-10-CM | POA: Diagnosis not present

## 2016-09-26 DIAGNOSIS — I120 Hypertensive chronic kidney disease with stage 5 chronic kidney disease or end stage renal disease: Secondary | ICD-10-CM | POA: Diagnosis not present

## 2016-09-26 DIAGNOSIS — L89319 Pressure ulcer of right buttock, unspecified stage: Secondary | ICD-10-CM | POA: Diagnosis present

## 2016-09-26 DIAGNOSIS — I5042 Chronic combined systolic (congestive) and diastolic (congestive) heart failure: Secondary | ICD-10-CM | POA: Diagnosis present

## 2016-09-26 DIAGNOSIS — Z992 Dependence on renal dialysis: Secondary | ICD-10-CM

## 2016-09-26 DIAGNOSIS — F329 Major depressive disorder, single episode, unspecified: Secondary | ICD-10-CM | POA: Diagnosis present

## 2016-09-26 DIAGNOSIS — R031 Nonspecific low blood-pressure reading: Secondary | ICD-10-CM | POA: Diagnosis not present

## 2016-09-26 DIAGNOSIS — N186 End stage renal disease: Secondary | ICD-10-CM | POA: Diagnosis not present

## 2016-09-26 DIAGNOSIS — N184 Chronic kidney disease, stage 4 (severe): Secondary | ICD-10-CM | POA: Diagnosis not present

## 2016-09-26 DIAGNOSIS — Z452 Encounter for adjustment and management of vascular access device: Secondary | ICD-10-CM | POA: Diagnosis not present

## 2016-09-26 LAB — COMPREHENSIVE METABOLIC PANEL
ALBUMIN: 3.5 g/dL (ref 3.5–5.0)
ALK PHOS: 37 U/L — AB (ref 38–126)
ALT: 12 U/L — ABNORMAL LOW (ref 17–63)
AST: 14 U/L — AB (ref 15–41)
Anion gap: 10 (ref 5–15)
BILIRUBIN TOTAL: 0.5 mg/dL (ref 0.3–1.2)
BUN: 31 mg/dL — AB (ref 6–20)
CO2: 27 mmol/L (ref 22–32)
CREATININE: 5.18 mg/dL — AB (ref 0.61–1.24)
Calcium: 9.2 mg/dL (ref 8.9–10.3)
Chloride: 97 mmol/L — ABNORMAL LOW (ref 101–111)
GFR calc Af Amer: 12 mL/min — ABNORMAL LOW (ref >60)
GFR calc non Af Amer: 11 mL/min — ABNORMAL LOW (ref >60)
GLUCOSE: 106 mg/dL — AB (ref 65–99)
Potassium: 4 mmol/L (ref 3.5–5.1)
Sodium: 134 mmol/L — ABNORMAL LOW (ref 135–145)
TOTAL PROTEIN: 7.5 g/dL (ref 6.5–8.1)

## 2016-09-26 LAB — TYPE AND SCREEN
ABO/RH(D): O POS
ANTIBODY SCREEN: NEGATIVE

## 2016-09-26 LAB — HEMOGLOBIN AND HEMATOCRIT, BLOOD
HCT: 23.6 % — ABNORMAL LOW (ref 39.0–52.0)
HCT: 27.2 % — ABNORMAL LOW (ref 39.0–52.0)
HEMATOCRIT: 27.9 % — AB (ref 39.0–52.0)
HEMOGLOBIN: 7.5 g/dL — AB (ref 13.0–17.0)
HEMOGLOBIN: 8.6 g/dL — AB (ref 13.0–17.0)
Hemoglobin: 8.6 g/dL — ABNORMAL LOW (ref 13.0–17.0)

## 2016-09-26 LAB — CBC WITH DIFFERENTIAL/PLATELET
BASOS ABS: 0 10*3/uL (ref 0.0–0.1)
BASOS PCT: 0 %
EOS PCT: 1 %
Eosinophils Absolute: 0.1 10*3/uL (ref 0.0–0.7)
HCT: 30.9 % — ABNORMAL LOW (ref 39.0–52.0)
Hemoglobin: 9.8 g/dL — ABNORMAL LOW (ref 13.0–17.0)
Lymphocytes Relative: 27 %
Lymphs Abs: 2.3 10*3/uL (ref 0.7–4.0)
MCH: 28.4 pg (ref 26.0–34.0)
MCHC: 31.7 g/dL (ref 30.0–36.0)
MCV: 89.6 fL (ref 78.0–100.0)
MONO ABS: 0.5 10*3/uL (ref 0.1–1.0)
Monocytes Relative: 6 %
Neutro Abs: 5.7 10*3/uL (ref 1.7–7.7)
Neutrophils Relative %: 66 %
PLATELETS: 198 10*3/uL (ref 150–400)
RBC: 3.45 MIL/uL — ABNORMAL LOW (ref 4.22–5.81)
RDW: 15.7 % — AB (ref 11.5–15.5)
WBC: 8.5 10*3/uL (ref 4.0–10.5)

## 2016-09-26 LAB — PROTIME-INR
INR: 3.37
Prothrombin Time: 34.9 seconds — ABNORMAL HIGH (ref 11.4–15.2)

## 2016-09-26 LAB — GLUCOSE, CAPILLARY: Glucose-Capillary: 126 mg/dL — ABNORMAL HIGH (ref 65–99)

## 2016-09-26 LAB — LIPASE, BLOOD: Lipase: 29 U/L (ref 11–51)

## 2016-09-26 LAB — POC OCCULT BLOOD, ED: Fecal Occult Bld: POSITIVE — AB

## 2016-09-26 MED ORDER — PHYTONADIONE 5 MG PO TABS
5.0000 mg | ORAL_TABLET | Freq: Once | ORAL | Status: AC
Start: 2016-09-26 — End: 2016-09-26
  Administered 2016-09-26: 5 mg via ORAL
  Filled 2016-09-26 (×2): qty 1

## 2016-09-26 MED ORDER — ONDANSETRON HCL 4 MG PO TABS: 4.0000 mg | ORAL_TABLET | Freq: Four times a day (QID) | ORAL | Status: DC | PRN

## 2016-09-26 MED ORDER — TRAMADOL HCL 50 MG PO TABS
50.0000 mg | ORAL_TABLET | Freq: Three times a day (TID) | ORAL | Status: DC | PRN
  Filled 2016-09-26: qty 1

## 2016-09-26 MED ORDER — SODIUM CHLORIDE 0.9 % IV SOLN: INTRAVENOUS | Status: DC

## 2016-09-26 MED ORDER — HYDRALAZINE HCL 50 MG PO TABS
100.0000 mg | ORAL_TABLET | Freq: Three times a day (TID) | ORAL | Status: DC
  Administered 2016-09-26: 100 mg via ORAL
  Filled 2016-09-26: qty 2

## 2016-09-26 MED ORDER — PANTOPRAZOLE SODIUM 40 MG IV SOLR
40.0000 mg | Freq: Two times a day (BID) | INTRAVENOUS | Status: DC
  Administered 2016-09-27 – 2016-09-29 (×4): 40 mg via INTRAVENOUS
  Filled 2016-09-26 (×6): qty 40

## 2016-09-26 MED ORDER — LATANOPROST 0.005 % OP SOLN
1.0000 [drp] | Freq: Every day | OPHTHALMIC | Status: DC
  Administered 2016-09-26 – 2016-10-01 (×5): 1 [drp] via OPHTHALMIC
  Filled 2016-09-26: qty 2.5

## 2016-09-26 MED ORDER — ONDANSETRON HCL 4 MG/2ML IJ SOLN: 4.0000 mg | Freq: Four times a day (QID) | INTRAMUSCULAR | Status: DC | PRN

## 2016-09-26 MED ORDER — CYCLOBENZAPRINE HCL 10 MG PO TABS: 5.0000 mg | ORAL_TABLET | Freq: Three times a day (TID) | ORAL | Status: DC | PRN

## 2016-09-26 MED ORDER — DILTIAZEM HCL ER COATED BEADS 120 MG PO CP24
120.0000 mg | ORAL_CAPSULE | Freq: Every day | ORAL | Status: DC
Start: 2016-09-26 — End: 2016-10-02
  Administered 2016-09-27 – 2016-09-30 (×4): 120 mg via ORAL
  Filled 2016-09-26 (×7): qty 1

## 2016-09-26 MED ORDER — PHYTONADIONE 1 MG/0.5 ML ORAL SOLUTION: 5.0000 mg | Freq: Once | ORAL | Status: DC

## 2016-09-26 MED ORDER — METOPROLOL SUCCINATE ER 100 MG PO TB24
100.0000 mg | ORAL_TABLET | Freq: Every day | ORAL | Status: DC
  Filled 2016-09-26: qty 1

## 2016-09-26 MED ORDER — ATORVASTATIN CALCIUM 40 MG PO TABS
40.0000 mg | ORAL_TABLET | Freq: Every day | ORAL | Status: DC
Start: 2016-09-26 — End: 2016-10-02
  Administered 2016-09-26 – 2016-10-01 (×6): 40 mg via ORAL
  Filled 2016-09-26 (×6): qty 1

## 2016-09-26 MED ORDER — CALCIUM ACETATE (PHOS BINDER) 667 MG PO CAPS
2001.0000 mg | ORAL_CAPSULE | Freq: Three times a day (TID) | ORAL | Status: DC
  Administered 2016-09-27 – 2016-10-02 (×13): 2001 mg via ORAL
  Filled 2016-09-26 (×13): qty 3

## 2016-09-26 MED ORDER — CINACALCET HCL 30 MG PO TABS
30.0000 mg | ORAL_TABLET | Freq: Every evening | ORAL | Status: DC
  Administered 2016-09-26 – 2016-10-01 (×6): 30 mg via ORAL
  Filled 2016-09-26 (×6): qty 1

## 2016-09-26 MED ORDER — HYDRALAZINE HCL 100 MG PO TABS: 100.0000 mg | ORAL_TABLET | Freq: Three times a day (TID) | ORAL | Status: DC

## 2016-09-26 MED ORDER — SODIUM CHLORIDE 0.9 % IV BOLUS (SEPSIS): 250.0000 mL | Freq: Once | INTRAVENOUS | Status: DC

## 2016-09-26 MED ORDER — ACETAMINOPHEN 325 MG PO TABS: 650.0000 mg | ORAL_TABLET | Freq: Four times a day (QID) | ORAL | Status: DC | PRN

## 2016-09-26 MED ORDER — HYDROXYZINE HCL 25 MG PO TABS: 50.0000 mg | ORAL_TABLET | Freq: Three times a day (TID) | ORAL | Status: DC | PRN

## 2016-09-26 MED ORDER — ACETAMINOPHEN 650 MG RE SUPP: 650.0000 mg | Freq: Four times a day (QID) | RECTAL | Status: DC | PRN

## 2016-09-26 MED ORDER — HYDRALAZINE HCL 20 MG/ML IJ SOLN: 10.0000 mg | Freq: Three times a day (TID) | INTRAMUSCULAR | Status: DC | PRN

## 2016-09-26 MED ORDER — QUETIAPINE FUMARATE 25 MG PO TABS
25.0000 mg | ORAL_TABLET | Freq: Two times a day (BID) | ORAL | Status: DC
  Administered 2016-09-26 – 2016-09-30 (×7): 25 mg via ORAL
  Filled 2016-09-26 (×8): qty 1

## 2016-09-26 NOTE — ED Notes (Signed)
Patient's wife states that patient normally has a low BP of systolic pressures of 0000000 and upper 80s.

## 2016-09-26 NOTE — Consult Note (Signed)
Cross cover [facility] Reason for Consult: Hematemesis. Referring Physician: THP  [name] is an 66 y.o. male.  HPI: Oscar Castillo is a 66 year old black male male with multiple medical problems listed below. GI consultation was requested again today for history of hematemesis and black stools. Records reviewed showed patient having an EGD and a colonoscopy done on 09/16/2016. EGD revealed  LA grade A esophagitis and 2 cm hiatal hernia acute gastritis and duodenitis. On his colonoscopy forced millimeter to colonic polyp was removed. Patient developed a bout of hematemesis during dialysis and came to the emergency room incidentally he gives a history of noticing black stool on one occasion. He denies having any abdominal pain nausea vomiting. Patient seems very fatigued and tired. In addition to hypertension  left bundle-branch block, he has  atrial fibrillation requiring chronic anticoagulation. Patient was recently admitted to the hospital and  had colonic disimpaction. Patient's anticoagulation was temporarily held during this admission and then restarted. Then slowly over the last 4 days patient began to develop dark black stools again. Patient had episode of hematemesis while at dialysis today. He he was found to be guaiac positive in the emergency room and is been a slight drop in his hemoglobin from 9.8-7.5 g/dL .  His hemoglobin today was 8.6 g/dL.   Past Medical History:  Diagnosis Date  . Anemia   . Antral ulcer 2014   small  . BACK PAIN, LUMBAR, CHRONIC   . BENIGN PROSTATIC HYPERTROPHY   . CEREBROVASCULAR ACCIDENT, HX OF   . CHOLELITHIASIS   . Chronic combined systolic and diastolic CHF (congestive heart failure) ([place])   . Complication of anesthesia    wife states pt had trouble waking up with his last surgery in Nov., 2014  . DEPRESSION   . DIABETES MELLITUS, TYPE II   . ERECTILE DYSFUNCTION   . ESRD on hemodialysis ([place])    ESRD due to DM/HTN. Started dialysis in November 2013.   HD TTS at [facility] on [place].  [name] GERD   . Hemorrhoids   . HEPATITIS C, HX OF   . History of Clostridium difficile   . HYPERTENSION   . LBBB (left bundle branch block)   . Morbid obesity ([place])   . NEPHROLITHIASIS, HX OF   . PAF (paroxysmal atrial fibrillation) ([place])    a. Dx 12/2015.  . S/P bilateral BKA (below knee amputation) [facility])    Past Surgical History:  Procedure Laterality Date  . AMPUTATION Left 05/12/2013   Procedure: AMPUTATION RAY;  Surgeon: Newt Minion, MD;  Location: [place];  Service: Orthopedics;  Laterality: Left;  Left Foot 1st Ray Amputation  . AMPUTATION Left 06/09/2013   Procedure: AMPUTATION BELOW KNEE;  Surgeon: Newt Minion, MD;  Location: [place];  Service: Orthopedics;  Laterality: Left;  Left Below Knee Amputation and removal proximal screws IM tibial nail  . AMPUTATION Right 09/08/2013   Procedure: AMPUTATION BELOW KNEE;  Surgeon: Newt Minion, MD;  Location: [place];  Service: Orthopedics;  Laterality: Right;  Right Below Knee Amputation  . AMPUTATION Right 10/11/2013   Procedure: AMPUTATION BELOW KNEE;  Surgeon: Newt Minion, MD;  Location: [place];  Service: Orthopedics;  Laterality: Right;  Right Below Knee Amputation Revision  . AV FISTULA PLACEMENT  06/14/2012   Procedure: ARTERIOVENOUS (AV) FISTULA CREATION;  Surgeon: Angelia Mould, MD;  [facility];  Service: Vascular;  Laterality: Left;  Left basilic vein transposition with fistula.  . COLONOSCOPY  N/A 10/28/2012   Procedure: COLONOSCOPY;  Surgeon: Jeryl Columbia, MD;  Location: [facility] ENDOSCOPY;  Service: Endoscopy;  Laterality: N/A;  . COLONOSCOPY N/A 11/02/2012   Procedure: COLONOSCOPY;  Surgeon: Cleotis Nipper, MD;  Location[facility] END[facility]COPY;  Service: Endoscopy;  Laterality: N/A;  . COLONOSCOPY N/A 11/03/2012   Procedure: COLONOSCOPY;  Surgeon: Cleotis Nipper, MD;  Location: [facility] ENDOSCOPY;  Service: Endoscopy;  Laterality: N/A;  . COLONOSCOPY N/A 09/16/2016   Procedure: COLONOSCOPY;  Surgeon:  Jerene Bears, MD;  Location: [facility] ENDOSCOPY;  Service: Gastroenterology;  Laterality: N/A;  . ENTEROSCOPY N/A 11/08/2012   Procedure: ENTEROSCOPY;  Surgeon: Wonda Horner, MD;  Location: [facility] ENDOSCOPY;  Service: Endoscopy;  Laterality: N/A;  . ESOPHAGOGASTRODUODENOSCOPY N/A 11/02/2012   Procedure: ESOPHAGOGASTRODUODENOSCOPY (EGD);  Surgeon: Cleotis Nipper, MD;  Location: [facility] ENDOSCOPY;  Service: Endoscopy;  Laterality: N/A;  . ESOPHAGOGASTRODUODENOSCOPY (EGD) WITH PROPOFOL N/A 09/16/2016   Procedure: ESOPHAGOGASTRODUODENOSCOPY (EGD) WITH PROPOFOL;  Surgeon: Jerene Bears, MD;  Location: [facility] ENDOSCOPY;  Service: Gastroenterology;  Laterality: N/A;  . EYE SURGERY Left    to remove scar tissue  . GIVENS CAPSULE STUDY N/A 11/04/2012   Procedure: GIVENS CAPSULE STUDY;  Surgeon: Cleotis Nipper, MD;  Location: [facility] ENDOSCOPY;  Service: Endoscopy;  Laterality: N/A;  . HARDWARE REMOVAL Left 06/09/2013   Procedure: HARDWARE REMOVAL;  Surgeon: Newt Minion, MD;  Location: [place];  Service: Orthopedics;  Laterality: Left;  Left Below Knee Amputation  and Removal proximal screws IM tibial nail  . NEPHRECTOMY     partial RR  . ORIF FIBULA FRACTURE Left 09/09/2012   Procedure: OPEN REDUCTION INTERNAL FIXATION (ORIF) FIBULA FRACTURE;  Surgeon: Johnny Bridge, MD;  Location: [place];  Service: Orthopedics;  Laterality: Left;  . TIBIA IM NAIL INSERTION Left 09/09/2012   Procedure: INTRAMEDULLARY (IM) NAIL TIBIAL;  Surgeon: Johnny Bridge, MD;  Location: [place];  Service: Orthopedics;  Laterality: Left;  left tibial nail and open reduction internal fixation left fibula fracture   Family History  Problem Relation Age of Onset  . Diabetes Mother   . Hypertension Mother   . Heart attack Father   . Hypertension Father   . Coronary artery disease Other    Social History:  reports that he has never smoked. He has never used smokeless tobacco. He reports that he does not drink alcohol or use drugs.  Allergies: No Known  Allergies  Medications: I have reviewed the patient's current medications.  Results for orders placed or performed during the hospital encounter of 09/26/16 (from the past 48 hour(s))  Type and screen [place]     Status: None   Collection Time: 09/26/16 11:15 AM  Result Value Ref Range   ABO/RH(D) O POS    Antibody Screen NEG    Sample Expiration 09/29/2016   Comprehensive metabolic panel     Status: Abnormal   Collection Time: 09/26/16 11:19 AM  Result Value Ref Range   Sodium 134 (L) 135 - 145 mmol/L   Potassium 4.0 3.5 - 5.1 mmol/L   Chloride 97 (L) 101 - 111 mmol/L   CO2 27 22 - 32 mmol/L   Glucose, Bld 106 (H) 65 - 99 mg/dL   BUN 31 (H) 6 - 20 mg/dL   Creatinine, Ser 5.18 (H) 0.61 - 1.24 mg/dL   Calcium 9.2 8.9 - 10.3 mg/dL   Total Protein 7.5 6.5 - 8.1 g/dL   Albumin 3.5 3.5 - 5.0 g/dL  AST 14 (L) 15 - 41 U/L   ALT 12 (L) 17 - 63 U/L   Alkaline Phosphatase 37 (L) 38 - 126 U/L   Total Bilirubin 0.5 0.3 - 1.2 mg/dL   GFR calc non Af Amer 11 (L) >60 mL/min   GFR calc Af Amer 12 (L) >60 mL/min    Comment: (NOTE) The eGFR has been calculated using the CKD EPI equation. This calculation has not been validated in all clinical situations. eGFR's persistently <60 mL/min signify possible Chronic Kidney Disease.    Anion gap 10 5 - 15  CBC with Differential     Status: Abnormal   Collection Time: 09/26/16 11:19 AM  Result Value Ref Range   WBC 8.5 4.0 - 10.5 K/uL   RBC 3.45 (L) 4.22 - 5.81 MIL/uL   Hemoglobin 9.8 (L) 13.0 - 17.0 g/dL   HCT 30.9 (L) 39.0 - 52.0 %   MCV 89.6 78.0 - 100.0 fL   MCH 28.4 26.0 - 34.0 pg   MCHC 31.7 30.0 - 36.0 g/dL   RDW 15.7 (H) 11.5 - 15.5 %   Platelets 198 150 - 400 K/uL   Neutrophils Relative % 66 %   Neutro Abs 5.7 1.7 - 7.7 K/uL   Lymphocytes Relative 27 %   Lymphs Abs 2.3 0.7 - 4.0 K/uL   Monocytes Relative 6 %   Monocytes Absolute 0.5 0.1 - 1.0 K/uL   Eosinophils Relative 1 %   Eosinophils Absolute 0.1 0.0 -  0.7 K/uL   Basophils Relative 0 %   Basophils Absolute 0.0 0.0 - 0.1 K/uL  Lipase, blood     Status: None   Collection Time: 09/26/16 11:19 AM  Result Value Ref Range   Lipase 29 11 - 51 U/L  Protime-INR     Status: Abnormal   Collection Time: 09/26/16 11:51 AM  Result Value Ref Range   Prothrombin Time 34.9 (H) 11.4 - 15.2 seconds   INR 3.37   POC occult blood, ED Provider will collect     Status: Abnormal   Collection Time: 09/26/16 12:24 PM  Result Value Ref Range   Fecal Occult Bld POSITIVE (A) NEGATIVE  Hemoglobin and hematocrit, blood     Status: Abnormal   Collection Time: 09/26/16  2:37 PM  Result Value Ref Range   Hemoglobin 7.5 (L) 13.0 - 17.0 g/dL    Comment: DELTA CHECK NOTED REPEATED TO VERIFY    HCT 23.6 (L) 39.0 - 52.0 %   Review of Systems  Constitutional: Positive for malaise/fatigue. Negative for chills, diaphoresis, fever and weight loss.  HENT: Negative.   Eyes: Negative.   Respiratory: Negative.   Cardiovascular: Negative.   Gastrointestinal: Positive for blood in stool, heartburn, melena, nausea and vomiting. Negative for abdominal pain, constipation and diarrhea.  Genitourinary: Negative.   Musculoskeletal: Positive for joint pain.  Skin: Negative.   Neurological: Positive for weakness.  Endo/Heme/Allergies: Negative.   Psychiatric/Behavioral: Positive for depression.   Blood pressure (!) 97/57, pulse 86, temperature 98.6 F (37 C), temperature source Oral, resp. rate 14, SpO2 100 %. Physical Exam  Constitutional: He is oriented to person, place, and time. He appears well-developed and well-nourished.  HENT:  Head: Normocephalic and atraumatic.  Eyes: Conjunctivae and EOM are normal. Pupils are equal, round, and reactive to light.  Neck: Normal range of motion. Neck supple.  Cardiovascular: Normal rate and regular rhythm.   Respiratory: Effort normal and breath sounds normal.  GI: Soft. Bowel sounds are normal.  Musculoskeletal:  Bilateral  lower leg amputee  Neurological: He is oriented to person, place, and time.  Skin: Skin is warm and dry.   Assessment/Plan: 1) Anemia with ?hematemesis and melena- patient has had an EGD within the last week and therefore do not feel the need to repeat an EGD at this time. His colonoscopy was essentially normal except for a 4 mm polyp was removed. My plans are to monitor his symptoms closely for the next 24 hours and if he continues to drop his hemoglobin of melenic stools a small bowel capsule study might be helpful. He may have small bowel AVMs. Continue supportive care and monitor serial CBCs for now.  2) HTN/Hyperlipidemia.  3) AODM.  4) CKD on HD. Antwanette Wesche 09/26/2016, 4:06 PM

## 2016-09-26 NOTE — H&P (Signed)
[facility] History and Physical  Oscar Castillo [name] DOB: 03/22/1951 DOA: 09/26/2016  Referring physician:  PCP: [name], MD   Chief Complaint: Had black diarrhea. -patient  HPI: Oscar Castillo is a 66 y.o. male  with past medical history significant for dialysis, diabetes, hepatitis C, high blood pressure, left bundle-branch block, atrial fibrillation, chronic anticoagulation presented to the hospital with black stools. History gathered from patient and his wife. 4/6 months patient had a long history of dark black stools. Patient was recently admitted to the hospital and under sedation had colonic disimpaction. During the same admission a gastric ulcer was found. Patient's anticoagulation was temporarily held during this admission and then restarted. Patient was better upon discharge. This slowly over the last 4 days patient began to develop dark black stools again. Patient had episode of hematemesis while at dialysis today. He did immediately came to emergency room.  ED course: Guaiac-positive. Type and screen ordered. Hospitalist consulted for admission.  Review of Systems:  As per HPI otherwise 10 point review of systems negative.    Past Medical History:  Diagnosis Date  . Anemia   . Antral ulcer 2014   small  . BACK PAIN, LUMBAR, CHRONIC   . BENIGN PROSTATIC HYPERTROPHY   . CEREBROVASCULAR ACCIDENT, HX OF   . CHOLELITHIASIS   . Chronic combined systolic and diastolic CHF (congestive heart failure) ([place])   . Complication of anesthesia    wife states pt had trouble waking up with his last surgery in Nov., 2014  . DEPRESSION   . DIABETES MELLITUS, TYPE II   . ERECTILE DYSFUNCTION   . ESRD on hemodialysis ([place])    ESRD due to DM/HTN. Started dialysis in November 2013.  HD TTS at [facility] on [place].  [name] GERD   . Hemorrhoids   . HEPATITIS C, HX OF   . History of Clostridium difficile   . HYPERTENSION   . LBBB (left bundle branch block)   . Morbid  obesity ([place])   . NEPHROLITHIASIS, HX OF   . PAF (paroxysmal atrial fibrillation) ([place])    a. Dx 12/2015.  . S/P bilateral BKA (below knee amputation) [facility])    Past Surgical History:  Procedure Laterality Date  . AMPUTATION Left 05/12/2013   Procedure: AMPUTATION RAY;  Surgeon: Newt Minion, MD;  Location: [place];  Service: Orthopedics;  Laterality: Left;  Left Foot 1st Ray Amputation  . AMPUTATION Left 06/09/2013   Procedure: AMPUTATION BELOW KNEE;  Surgeon: Newt Minion, MD;  Location: [place];  Service: Orthopedics;  Laterality: Left;  Left Below Knee Amputation and removal proximal screws IM tibial nail  . AMPUTATION Right 09/08/2013   Procedure: AMPUTATION BELOW KNEE;  Surgeon: Newt Minion, MD;  Location: [place];  Service: Orthopedics;  Laterality: Right;  Right Below Knee Amputation  . AMPUTATION Right 10/11/2013   Procedure: AMPUTATION BELOW KNEE;  Surgeon: Newt Minion, MD;  Location: [place];  Service: Orthopedics;  Laterality: Right;  Right Below Knee Amputation Revision  . AV FISTULA PLACEMENT  06/14/2012   Procedure: ARTERIOVENOUS (AV) FISTULA CREATION;  Surgeon: Angelia Mould, MD;  Location: [facility] OR;  Service: Vascular;  Laterality: Left;  Left basilic vein transposition with fistula.  . COLONOSCOPY N/A 10/28/2012   Procedure: COLONOSCOPY;  Surgeon: Jeryl Columbia, MD;  Location: [facility] ENDOSCOPY;  Service: Endoscopy;  Laterality: N/A;  . COLONOSCOPY N/A 11/02/2012   Procedure: COLONOSCOPY;  Surgeon: Cleotis Nipper, MD;  Location:  [place] ENDOSCOPY;  Service: Endoscopy;  Laterality: N/A;  . COLONOSCOPY N/A 11/03/2012   Procedure: COLONOSCOPY;  Surgeon: Cleotis Nipper, MD;  Location: [facility] ENDOSCOPY;  Service: Endoscopy;  Laterality: N/A;  . COLONOSCOPY N/A 09/16/2016   Procedure: COLONOSCOPY;  Surgeon: Jerene Bears, MD;  Location: [facility] ENDOSCOPY;  Service: Gastroenterology;  Laterality: N/A;  . ENTEROSCOPY N/A 11/08/2012   Procedure: ENTEROSCOPY;  Surgeon: Wonda Horner, MD;  Location: [facility]  ENDOSCOPY;  Service: Endoscopy;  Laterality: N/A;  . ESOPHAGOGASTRODUODENOSCOPY N/A 11/02/2012   Procedure: ESOPHAGOGASTRODUODENOSCOPY (EGD);  Surgeon: Cleotis Nipper, MD;  Location: [facility] ENDOSCOPY;  Service: Endoscopy;  Laterality: N/A;  . ESOPHAGOGASTRODUODENOSCOPY (EGD) WITH PROPOFOL N/A 09/16/2016   Procedure: ESOPHAGOGASTRODUODENOSCOPY (EGD) WITH PROPOFOL;  Surgeon: Jerene Bears, MD;  Location: [facility] ENDOSCOPY;  Service: Gastroenterology;  Laterality: N/A;  . EYE SURGERY Left    to remove scar tissue  . GIVENS CAPSULE STUDY N/A 11/04/2012   Procedure: GIVENS CAPSULE STUDY;  Surgeon: Cleotis Nipper, MD;  Location: [facility] ENDOSCOPY;  Service: Endoscopy;  Laterality: N/A;  . HARDWARE REMOVAL Left 06/09/2013   Procedure: HARDWARE REMOVAL;  Surgeon: Newt Minion, MD;  Location: [place];  Service: Orthopedics;  Laterality: Left;  Left Below Knee Amputation  and Removal proximal screws IM tibial nail  . NEPHRECTOMY     partial RR  . ORIF FIBULA FRACTURE Left 09/09/2012   Procedure: OPEN REDUCTION INTERNAL FIXATION (ORIF) FIBULA FRACTURE;  Surgeon: Johnny Bridge, MD;  Location: [place];  Service: Orthopedics;  Laterality: Left;  . TIBIA IM NAIL INSERTION Left 09/09/2012   Procedure: INTRAMEDULLARY (IM) NAIL TIBIAL;  Surgeon: Johnny Bridge, MD;  Location: [place];  Service: Orthopedics;  Laterality: Left;  left tibial nail and open reduction internal fixation left fibula fracture   Social History:  reports that he has never smoked. He has never used smokeless tobacco. He reports that he does not drink alcohol or use drugs.  No Known Allergies  Family History  Problem Relation Age of Onset  . Diabetes Mother   . Hypertension Mother   . Heart attack Father   . Hypertension Father   . Coronary artery disease Other      Prior to Admission medications   Medication Sig Start Date End Date Taking? Authorizing Provider  acetaminophen (TYLENOL) 500 MG tablet Take 500 mg by mouth every 6 (six) hours as needed  for mild pain.   Yes Historical Provider, MD  atorvastatin (LIPITOR) 40 MG tablet TAKE 1 TABLET(40 MG) BY MOUTH DAILY AT 6 PM 08/10/16  Yes [name], MD  calcium acetate (PHOSLO) 667 MG capsule Take 2,001 mg by mouth 3 (three) times daily with meals.   Yes Historical Provider, MD  cyclobenzaprine (FLEXERIL) 5 MG tablet Take 1 tablet (5 mg total) by mouth 3 (three) times daily as needed for muscle spasms. 07/08/16  Yes [name], MD  diltiazem (CARDIZEM CD) 120 MG 24 hr capsule Take 1 capsule (120 mg total) by mouth daily. 01/09/16  Yes [name], MD  hydrALAZINE (APRESOLINE) 100 MG tablet TAKE 1 TABLET BY MOUTH EVERY 8 HOURS 07/07/16  Yes [name], MD  HYDROcodone-acetaminophen (NORCO/VICODIN) 5-325 MG tablet Take 1 tablet by mouth every 6 (six) hours as needed for moderate pain.   Yes Historical Provider, MD  hydrOXYzine (ATARAX/VISTARIL) 25 MG tablet TAKE 1 TABLET(25 MG) BY MOUTH EVERY 6 HOURS AS NEEDED Patient taking differently: TAKE 1 TABLET(25 MG) BY MOUTH EVERY 6 HOURS  AS NEEDED FOR ANXIETY 07/25/16  Yes [name], MD  ketoconazole (NIZORAL) 2 % cream Apply 1 application topically daily as needed for irritation. 09/16/16  Yes Barton Dubois, MD  latanoprost (XALATAN) 0.005 % ophthalmic solution Place 1 drop into both eyes at bedtime.  06/12/16  Yes Historical Provider, MD  lidocaine-prilocaine (EMLA) cream APPLY A SMALL AMOUNT TO SKIN AT THE ACCESS SITE (AVF) AS DIRECTED BEFORE EACH DIALYSIS SESSION THREE DAYS A WEEK 06/03/15  Yes Historical Provider, MD  metoprolol succinate (TOPROL-XL) 100 MG 24 hr tablet Take 1 tablet (100 mg total) by mouth daily. Take with or immediately following a meal. 01/09/16  Yes [name], MD  ondansetron (ZOFRAN ODT) 4 MG disintegrating tablet 4mg  ODT q4 hours prn nausea/vomit 06/16/16  Yes Milton Ferguson, MD  pantoprazole (PROTONIX) 40 MG tablet Take 1 tablet (40 mg total) by mouth 2 (two) times daily before a meal. 09/16/16  Yes Barton Dubois, MD    polyethylene glycol (MIRALAX / GLYCOLAX) packet Take 17 g by mouth 2 (two) times daily. 09/16/16  Yes Barton Dubois, MD  QUEtiapine (SEROQUEL) 25 MG tablet Take 1 tablet (25 mg total) by mouth 2 (two) times daily. 04/07/16  Yes [name], MD  SENSIPAR 30 MG tablet Take 30 mg by mouth every evening.  01/09/15  Yes Historical Provider, MD  silver sulfADIAZINE (SILVADENE) 1 % cream Apply 1 application topically daily as needed (irratation). 09/16/16  Yes Barton Dubois, MD  traMADol (ULTRAM) 50 MG tablet Take 1 tablet (50 mg total) by mouth every 8 (eight) hours as needed. Patient taking differently: Take 50 mg by mouth every 8 (eight) hours.  06/01/16  Yes [name], MD  triamcinolone cream (KENALOG) 0.1 % Apply 1 application topically as needed (irritation).  07/17/16  Yes Historical Provider, MD  VOLTAREN 1 % GEL APPLY 2 GRAMS TO HANDS BID TO TID AS NEEDED FOR PAIN. 09/19/15  Yes Historical Provider, MD  warfarin (COUMADIN) 2.5 MG tablet Take 3.75 Mg daily, except for Sundays when you will take 2.5 Mg Patient taking differently: Take 2.5-3.75 mg by mouth See admin instructions. Take 3.75 Mg daily, except for Sundays when you will take 2.5 Mg 09/16/16  Yes Barton Dubois, MD   Physical Exam: Vitals:   09/26/16 1230 09/26/16 1315 09/26/16 1330 09/26/16 1544  BP: 94/72 100/78 (!) 88/71 (!) 97/57  Pulse: 90   86  Resp: 15 14  14   Temp:    98.6 F (37 C)  TempSrc:    Oral  SpO2: 100%   100%    Wt Readings from Last 3 Encounters:  09/16/16 104.3 kg (230 lb)  08/20/16 106.6 kg (235 lb)  08/04/16 106.6 kg (235 lb)    General:  Appears calm and comfortable, Alert and oriented 3 Eyes:  PERRL, EOMI, normal lids, iris ENT:  grossly normal hearing, lips & tongue Neck:  no LAD, masses or thyromegaly Cardiovascular:  RRR, no m/r/g. No LE edema.  Respiratory:  CTA bilaterally, no w/r/r. Normal respiratory effort. Abdomen:  soft, ntnd Skin:  no rash or induration seen on limited  exam Musculoskeletal:  grossly normal tone BUE/BLE With lower extremity amputations Psychiatric:  grossly normal mood and affect, speech fluent and appropriate Neurologic:  CN 2-12 grossly intact, moves all extremities in coordinated fashion.          Labs on Admission:  Basic Metabolic Panel:  Recent Labs Lab 09/26/16 1119  NA 134*  K 4.0  CL 97*  CO2 27  GLUCOSE 106*  BUN 31*  CREATININE 5.18*  CALCIUM 9.2   Liver Function Tests:  Recent Labs Lab 09/26/16 1119  AST 14*  ALT 12*  ALKPHOS 37*  BILITOT 0.5  PROT 7.5  ALBUMIN 3.5    Recent Labs Lab 09/26/16 1119  LIPASE 29   No results for input(s): AMMONIA in the last 168 hours. CBC:  Recent Labs Lab 09/26/16 1119 09/26/16 1437  WBC 8.5  --   NEUTROABS 5.7  --   HGB 9.8* 7.5*  HCT 30.9* 23.6*  MCV 89.6  --   PLT 198  --    Cardiac Enzymes: No results for input(s): CKTOTAL, CKMB, CKMBINDEX, TROPONINI in the last 168 hours.  BNP (last 3 results) No results for input(s): BNP in the last 8760 hours.  ProBNP (last 3 results) No results for input(s): PROBNP in the last 8760 hours.   Serum creatinine: 5.18 mg/dL High 09/26/16 1119 Estimated creatinine clearance: 17.2 mL/min  CBG: No results for input(s): GLUCAP in the last 168 hours.  Radiological Exams on Admission: No results found.  EKG: n/a  Assessment/Plan Active Problems:   Essential hypertension   DM (diabetes mellitus) type I controlled with renal manifestation (HCC)   Melena  GI bleed/anemia Most recent hemoglobin with History of anemia Serial H&H IV fluids KVO - caution due to renal issues History of bleed on recent admission with ulcer seen GI consult has been ordered Avoid NSAIDs Hold the following medications warfarin  vitamin K to reverse INR Nothing by mouth after midnight for EGD in the morning  Hemodialysis Nephrology has been consulted [place] tx today, compete  DM  SSI  CHF Monitor clinically Daily  weights  Hyperlipidemia Continue statin  Hypertension When necessary hydralazine 10 mg IV as needed for severe blood pressure Cont hydralazine, lopressor  Hx of Hep C No LFT elevations on admit  Code Status: FULL  DVT Prophylaxis: SCDs Family Communication: wife at bedside Disposition Plan: Pending Improvement  Status: inpt tele  Elwin Mocha, MD Family Medicine [facility] [email or web address] Password [email or web address]

## 2016-09-26 NOTE — ED Triage Notes (Signed)
PT reported he had dark stools on Friday  x2.

## 2016-09-26 NOTE — ED Notes (Signed)
Unsuccessful attempt at calling report.

## 2016-09-26 NOTE — ED Triage Notes (Signed)
Pt presents via [facility] from dialysis today for evaluation of one episode of dark brown emesis at 0630 during dialysis. Pt did complete 3/3.5 hr of treatment today. Pt has no complaints on arrival to ED. Pt denies N/V/D or abd pain.

## 2016-09-26 NOTE — ED Provider Notes (Signed)
[place] DEPT Provider Note   CSN: [id number] Arrival date & time: 09/26/16  1027     History   Chief Complaint Chief Complaint  Patient presents with  . GI Bleeding    HPI NAITHAN SANCHES is a 66 y.o. male.  ROZELLE YUILL is a 66 y.o. Male with history of CHF, end-stage renal disease on dialysis Tuesday, Thursday and Saturday, bilateral below the knee amputation, diabetes, paroxysmal A. fib on Coumadin, who presents to the emergency department from dialysis after possibly throwing up some blood. Patient tells me he was completing dialysis when he vomited some brown emesis. He reports vomiting twice. He denies other vomiting. He denies any nausea or abdominal pain. He reports having black stools ongoing for several months. He reports that is unchanged recently. Had a recent colonoscopy and endoscopy. Endoscopy showed an ulcer according to the patient. He denies chest pain, shortness of breath, abdominal pain, nausea, diarrhea, syncope, or rashes.  Per medical records the patient had a colonoscopy on 09/16/2016 which showed no evidence of GI bleed. He also had an upper endoscopy on the same date that showed a gastric ulcer. He was seen by GI Dr. Hilarie Fredrickson.    The history is provided by the patient, medical records and the spouse. No language interpreter was used.    Past Medical History:  Diagnosis Date  . Anemia   . Antral ulcer 2014   small  . BACK PAIN, LUMBAR, CHRONIC   . BENIGN PROSTATIC HYPERTROPHY   . CEREBROVASCULAR ACCIDENT, HX OF   . CHOLELITHIASIS   . Chronic combined systolic and diastolic CHF (congestive heart failure) ([place])   . Complication of anesthesia    wife states pt had trouble waking up with his last surgery in Nov., 2014  . DEPRESSION   . DIABETES MELLITUS, TYPE II   . ERECTILE DYSFUNCTION   . ESRD on hemodialysis ([place])    ESRD due to DM/HTN. Started dialysis in November 2013.  HD TTS at [facility] on [address].  [name] GERD   . Hemorrhoids   .  HEPATITIS C, HX OF   . History of Clostridium difficile   . HYPERTENSION   . LBBB (left bundle branch block)   . Morbid obesity ([place])   . NEPHROLITHIASIS, HX OF   . PAF (paroxysmal atrial fibrillation) ([place])    a. Dx 12/2015.  . S/P bilateral BKA (below knee amputation) [facility])     Patient Active Problem List   Diagnosis Date Noted  . Benign neoplasm of descending colon   . Gastroesophageal reflux disease with esophagitis   . Duodenal ulcer without hemorrhage or perforation   . Blood loss anemia 09/15/2016  . Atrial fibrillation ([place]) 09/15/2016  . Heme positive stool 09/15/2016  . RUQ pain 07/12/2016  . Hyperkalemia 07/01/2016  . New onset a-fib ([place]) 12/27/2015  . Glaucoma 12/27/2015  . LBBB (left bundle branch block)   . Rash 11/15/2015  . Cramping of hands 11/15/2015  . Liver fibrosis ([place]) 10/07/2015  . Diarrhea 08/14/2014  . Dehydration 10/02/2013  . Fever 10/02/2013  . Altered mental status 10/01/2013  . Encephalopathy, toxic 10/01/2013  . Encephalopathy, metabolic 99991111  . CVA (cerebral vascular accident) ([place]) 09/28/2013  . FTT (failure to thrive) in adult 09/28/2013  . Acute confusional state 09/28/2013  . Ulcer of sacral region, stage 3 ([place]) 09/26/2013  . S/P BKA (below knee amputation) bilateral ([place]) 09/08/2013  . ESRD on hemodialysis ([place]) 05/09/2013  . TIA (transient ischemic  attack) 02/20/2013  . Acute blood loss anemia 10/28/2012  . Chronic combined systolic and diastolic congestive heart failure 10/28/2012  . Constipation 10/28/2012  . GI bleed 10/27/2012  . Mass in rectum 10/27/2012  . DM (diabetes mellitus) type I controlled with renal manifestation ([place]) 09/08/2012  . LVH (left ventricular hypertrophy)-severe concentric 06/13/2012  . Anemia due to chronic illness 06/12/2012  . Hyperlipidemia 01/30/2011  . CHOLELITHIASIS 08/01/2010  . BENIGN PROSTATIC HYPERTROPHY 08/01/2010  . CEREBROVASCULAR ACCIDENT, HX OF 08/06/2009  . Depression  03/18/2009  . Acute combined systolic and diastolic heart failure, NYHA class 2-EF 45% 03/18/2009  . NEPHROLITHIASIS, HX OF 03/18/2009  . Morbid obesity ([place]) 03/25/2007  . Essential hypertension 03/25/2007  . GERD 03/25/2007  . Chronic hepatitis C without hepatic coma ([place]) 03/25/2007    Past Surgical History:  Procedure Laterality Date  . AMPUTATION Left 05/12/2013   Procedure: AMPUTATION RAY;  Surgeon: Newt Minion, MD;  Location: [place];  Service: Orthopedics;  Laterality: Left;  Left Foot 1st Ray Amputation  . AMPUTATION Left 06/09/2013   Procedure: AMPUTATION BELOW KNEE;  Surgeon: Newt Minion, MD;  Location: [place];  Service: Orthopedics;  Laterality: Left;  Left Below Knee Amputation and removal proximal screws IM tibial nail  . AMPUTATION Right 09/08/2013   Procedure: AMPUTATION BELOW KNEE;  Surgeon: Newt Minion, MD;  Location: [place];  Service: Orthopedics;  Laterality: Right;  Right Below Knee Amputation  . AMPUTATION Right 10/11/2013   Procedure: AMPUTATION BELOW KNEE;  Surgeon: Newt Minion, MD;  Location: [place];  Service: Orthopedics;  Laterality: Right;  Right Below Knee Amputation Revision  . AV FISTULA PLACEMENT  06/14/2012   Procedure: ARTERIOVENOUS (AV) FISTULA CREATION;  Surgeon: Angelia Mould, MD;  Location: [facility] OR;  Service: Vascular;  Laterality: Left;  Left basilic vein transposition with fistula.  . COLONOSCOPY N/A 10/28/2012   Procedure: COLONOSCOPY;  Surgeon: Jeryl Columbia, MD;  Location: [facility] ENDOSCOPY;  Service: Endoscopy;  Laterality: N/A;  . COLONOSCOPY N/A 11/02/2012   Procedure: COLONOSCOPY;  Surgeon: Cleotis Nipper, MD;  Location: [facility] ENDOSCOPY;  Service: Endoscopy;  Laterality: N/A;  . COLONOSCOPY N/A 11/03/2012   Procedure: COLONOSCOPY;  Surgeon: Cleotis Nipper, MD;  Location: [facility] ENDOSCOPY;  Service: Endoscopy;  Laterality: N/A;  . COLONOSCOPY N/A 09/16/2016   Procedure: COLONOSCOPY;  Surgeon: Jerene Bears, MD;  Location: [facility] ENDOSCOPY;  Service:  Gastroenterology;  Laterality: N/A;  . ENTEROSCOPY N/A 11/08/2012   Procedure: ENTEROSCOPY;  Surgeon: Wonda Horner, MD;  Location: [facility] ENDOSCOPY;  Service: Endoscopy;  Laterality: N/A;  . ESOPHAGOGASTRODUODENOSCOPY N/A 11/02/2012   Procedure: ESOPHAGOGASTRODUODENOSCOPY (EGD);  Surgeon: Cleotis Nipper, MD;  Location: [facility] ENDOSCOPY;  Service: Endoscopy;  Laterality: N/A;  . ESOPHAGOGASTRODUODENOSCOPY (EGD) WITH PROPOFOL N/A 09/16/2016   Procedure: ESOPHAGOGASTRODUODENOSCOPY (EGD) WITH PROPOFOL;  Surgeon: Jerene Bears, MD;  Location: [facility] ENDOSCOPY;  Service: Gastroenterology;  Laterality: N/A;  . EYE SURGERY Left    to remove scar tissue  . GIVENS CAPSULE STUDY N/A 11/04/2012   Procedure: GIVENS CAPSULE STUDY;  Surgeon: Cleotis Nipper, MD;  Location: [facility] ENDOSCOPY;  Service: Endoscopy;  Laterality: N/A;  . HARDWARE REMOVAL Left 06/09/2013   Procedure: HARDWARE REMOVAL;  Surgeon: Newt Minion, MD;  Location: [place];  Service: Orthopedics;  Laterality: Left;  Left Below Knee Amputation  and Removal proximal screws IM tibial nail  . NEPHRECTOMY     partial RR  . ORIF FIBULA FRACTURE Left 09/09/2012  Procedure: OPEN REDUCTION INTERNAL FIXATION (ORIF) FIBULA FRACTURE;  Surgeon: Johnny Bridge, MD;  Location: [place];  Service: Orthopedics;  Laterality: Left;  . TIBIA IM NAIL INSERTION Left 09/09/2012   Procedure: INTRAMEDULLARY (IM) NAIL TIBIAL;  Surgeon: Johnny Bridge, MD;  Location: [place];  Service: Orthopedics;  Laterality: Left;  left tibial nail and open reduction internal fixation left fibula fracture       Home Medications    Prior to Admission medications   Medication Sig Start Date End Date Taking? Authorizing Provider  acetaminophen (TYLENOL) 500 MG tablet Take 500 mg by mouth every 6 (six) hours as needed for mild pain.    Historical Provider, MD  atorvastatin (LIPITOR) 40 MG tablet TAKE 1 TABLET(40 MG) BY MOUTH DAILY AT 6 PM 08/10/16   [name], MD  calcium acetate (PHOSLO) 667 MG  capsule Take 2,001 mg by mouth 3 (three) times daily with meals.    Historical Provider, MD  cyclobenzaprine (FLEXERIL) 5 MG tablet Take 1 tablet (5 mg total) by mouth 3 (three) times daily as needed for muscle spasms. 07/08/16   [name], MD  diltiazem (CARDIZEM CD) 120 MG 24 hr capsule Take 1 capsule (120 mg total) by mouth daily. 01/09/16   [name], MD  hydrALAZINE (APRESOLINE) 100 MG tablet TAKE 1 TABLET BY MOUTH EVERY 8 HOURS 07/07/16   [name], MD  HYDROcodone-acetaminophen (NORCO/VICODIN) 5-325 MG tablet Take 1 tablet by mouth every 6 (six) hours as needed for moderate pain.    Historical Provider, MD  hydrOXYzine (ATARAX/VISTARIL) 25 MG tablet TAKE 1 TABLET(25 MG) BY MOUTH EVERY 6 HOURS AS NEEDED Patient taking differently: TAKE 1 TABLET(25 MG) BY MOUTH EVERY 6 HOURS AS NEEDED FOR ANXIETY 07/25/16   [name], MD  ketoconazole (NIZORAL) 2 % cream Apply 1 application topically daily as needed for irritation. 09/16/16   Barton Dubois, MD  latanoprost (XALATAN) 0.005 % ophthalmic solution Place 1 drop into both eyes at bedtime.  06/12/16   Historical Provider, MD  lidocaine-prilocaine (EMLA) cream APPLY A SMALL AMOUNT TO SKIN AT THE ACCESS SITE (AVF) AS DIRECTED BEFORE EACH DIALYSIS SESSION THREE DAYS A WEEK 06/03/15   Historical Provider, MD  metoprolol succinate (TOPROL-XL) 100 MG 24 hr tablet Take 1 tablet (100 mg total) by mouth daily. Take with or immediately following a meal. 01/09/16   [name], MD  ondansetron (ZOFRAN ODT) 4 MG disintegrating tablet 4mg  ODT q4 hours prn nausea/vomit 06/16/16   Milton Ferguson, MD  pantoprazole (PROTONIX) 40 MG tablet Take 1 tablet (40 mg total) by mouth 2 (two) times daily before a meal. 09/16/16   Barton Dubois, MD  polyethylene glycol [facility] Floria Raveling) packet Take 17 g by mouth 2 (two) times daily. 09/16/16   Barton Dubois, MD  QUEtiapine (SEROQUEL) 25 MG tablet Take 1 tablet (25 mg total) by mouth 2 (two) times daily. 04/07/16   [name], MD  SENSIPAR 30 MG tablet Take 30 mg by mouth every evening.  01/09/15   Historical Provider, MD  silver sulfADIAZINE (SILVADENE) 1 % cream Apply 1 application topically daily as needed (irratation). 09/16/16   Barton Dubois, MD  sucroferric oxyhydroxide (VELPHORO) 500 MG chewable tablet Chew 500 mg by mouth 3 (three) times daily with meals.    Historical Provider, MD  traMADol (ULTRAM) 50 MG tablet Take 1 tablet (50 mg total) by mouth every 8 (eight) hours as needed. Patient taking differently: Take 50  mg by mouth every 8 (eight) hours.  06/01/16   [name], MD  triamcinolone cream (KENALOG) 0.1 % Apply 1 application topically as needed (irritation).  07/17/16   Historical Provider, MD  VOLTAREN 1 % GEL APPLY 2 GRAMS TO HANDS BID TO TID AS NEEDED FOR PAIN. 09/19/15   Historical Provider, MD  warfarin (COUMADIN) 2.5 MG tablet Take 3.75 Mg daily, except for Sundays when you will take 2.5 Mg 09/16/16   Barton Dubois, MD    Family History Family History  Problem Relation Age of Onset  . Diabetes Mother   . Hypertension Mother   . Heart attack Father   . Hypertension Father   . Coronary artery disease Other     Social History Social History  Substance Use Topics  . Smoking status: Never Smoker  . Smokeless tobacco: Never Used  . Alcohol use No     Allergies   Patient has no known allergies.   Review of Systems Review of Systems  Constitutional: Negative for chills and fever.  HENT: Negative for sore throat.   Eyes: Negative for visual disturbance.  Respiratory: Negative for cough and shortness of breath.   Cardiovascular: Negative for chest pain.  Gastrointestinal: Positive for blood in stool and vomiting. Negative for abdominal pain, diarrhea and nausea.  Genitourinary: Negative for penile pain.       Makes no urine.  Musculoskeletal: Negative for back pain.  Skin: Negative for rash.  Neurological: Negative for syncope, light-headedness and headaches.     Physical  Exam Updated Vital Signs BP 99/71 (BP Location: Right Arm)   Pulse 99   Temp 98.6 F (37 C) (Oral)   Resp 16   SpO2 98%   Physical Exam  Constitutional: He is oriented to person, place, and time. He appears well-developed and well-nourished. No distress.  Nontoxic appearing.  HENT:  Head: Normocephalic and atraumatic.  Mouth/Throat: Oropharynx is clear and moist.  Eyes: Conjunctivae are normal. Pupils are equal, round, and reactive to light. Right eye exhibits no discharge. Left eye exhibits no discharge.  Neck: Neck supple.  Cardiovascular: Normal rate, regular rhythm, normal heart sounds and intact distal pulses.  Exam reveals no gallop and no friction rub.   No murmur heard. Pulmonary/Chest: Effort normal and breath sounds normal. No respiratory distress. He has no wheezes. He has no rales.  Abdominal: Soft. Bowel sounds are normal. He exhibits no mass. There is no tenderness. There is no guarding.  Abdomen is soft and nontender to palpation. Bowel sounds are present.  Genitourinary: Rectal exam shows guaiac positive stool.  Genitourinary Comments: Digital rectal exam by myself with male RN chaperone. Patient has dark black stool on exam.  Musculoskeletal: He exhibits no edema.  Lymphadenopathy:    He has no cervical adenopathy.  Neurological: He is alert and oriented to person, place, and time. Coordination normal.  Skin: Skin is warm and dry. Capillary refill takes less than 2 seconds. No rash noted. He is not diaphoretic. No erythema. No pallor.  Psychiatric: He has a normal mood and affect. His behavior is normal.  Nursing note and vitals reviewed.    ED Treatments / Results  Labs (all labs ordered are listed, but only abnormal results are displayed) Labs Reviewed  COMPREHENSIVE METABOLIC PANEL - Abnormal; Notable for the following:       Result Value   Sodium 134 (*)    Chloride 97 (*)    Glucose, Bld 106 (*)    BUN 31 (*)  Creatinine, Ser 5.18 (*)    AST 14  (*)    ALT 12 (*)    Alkaline Phosphatase 37 (*)    GFR calc non Af Amer 11 (*)    GFR calc Af Amer 12 (*)    All other components within normal limits  CBC WITH DIFFERENTIAL/PLATELET - Abnormal; Notable for the following:    RBC 3.45 (*)    Hemoglobin 9.8 (*)    HCT 30.9 (*)    RDW 15.7 (*)    All other components within normal limits  PROTIME-INR - Abnormal; Notable for the following:    Prothrombin Time 34.9 (*)    All other components within normal limits  POC OCCULT BLOOD, ED - Abnormal; Notable for the following:    Fecal Occult Bld POSITIVE (*)    All other components within normal limits  LIPASE, BLOOD  HEMOGLOBIN AND HEMATOCRIT, BLOOD  HEMOGLOBIN AND HEMATOCRIT, BLOOD  HEMOGLOBIN AND HEMATOCRIT, BLOOD  TYPE AND SCREEN    EKG  EKG Interpretation None       Radiology No results found.  Procedures Procedures (including critical care time)  Medications Ordered in ED Medications - No data to display   Initial Impression / Assessment and Plan / ED Course  I have reviewed the triage vital signs and the nursing notes.  Pertinent labs & imaging results that were available during my care of the patient were reviewed by me and considered in my medical decision making (see chart for details).    This  is a 66 y.o. Male with history of CHF, end-stage renal disease on dialysis Tuesday, Thursday and Saturday, bilateral below the knee amputation, diabetes, paroxysmal A. fib on Coumadin, who presents to the emergency department from dialysis after possibly throwing up some blood. Patient tells me he was completing dialysis when he vomited some brown emesis. He reports vomiting twice. He denies other vomiting. He denies any nausea or abdominal pain. He reports having black stools ongoing for several months. He reports that is unchanged recently. Per medical records the patient had a colonoscopy on 09/16/2016 which showed no evidence of GI bleed. He also had an upper endoscopy  on the same date that showed a gastric ulcer. He was seen by GI Dr. Hilarie Fredrickson.   On exam the patient is afebrile nontoxic appearing. His abdomen is soft nontender to palpation. Initial blood pressure is in the 0000000 systolic. Patient is mentating well and has no tachycardia. I believe this may be related to his recent dialysis. Repeat blood pressure is 100/78. Will keep an eye on this closely.  Patient has melena on exam.  Hemoccult is positive. INR is 3.37. CBC shows a hemoglobin of 9.8. This is about a gram and a half drop from this past week. Will plan for admission for repeat H/H and GI follow up. Question if patient should still be on coumadin.   I consulted with Dr. Aggie Moats who accepted the patient for admission.    This patient was discussed with Dr. Laneta Simmers who agrees with assessment and plan.   Final Clinical Impressions(s) / ED Diagnoses   Final diagnoses:  Melena  Anemia, unspecified type    New Prescriptions New Prescriptions   No medications on file     Waynetta Pean, PA-C 09/26/16 [place], MD 09/26/16 1759

## 2016-09-26 NOTE — Consult Note (Signed)
[facility] Renal Consultation Note  Indication for Consultation:  Management of ESRD/hemodialysis; anemia, hypertension/volume and secondary hyperparathyroidism  HPI: Oscar Castillo is a 66 y.o. male with HO CVA  2009/On chronic HD sec DM on  HD  TTS )( [facility] admitted after complete HD today with GI Bleed.Hemesis x2 at out pt center today and reports dark stools past week . Has ho paroxysmal A. fib on Coumadin  And  colonoscopy  09/16/2016 =no evidence of GI bleed.? upper endoscopy on the same date that showed a gastric ulcer. He was seen by GI Dr. Hilarie Fredrickson. IN the ER denies abdominal pain , chest pain, sob , cough , fever, chills.   In the ER Hemoccult is positive. INR is 3.37.  hemoglobin of 9.8. Last OP hgb  =10.6  09/24/16 . K=4.0 . We are consulted for ESRD / HD needs. No hd needed today .      Past Medical History:  Diagnosis Date  . Anemia   . Antral ulcer 2014   small  . BACK PAIN, LUMBAR, CHRONIC   . BENIGN PROSTATIC HYPERTROPHY   . CEREBROVASCULAR ACCIDENT, HX OF   . CHOLELITHIASIS   . Chronic combined systolic and diastolic CHF (congestive heart failure) ([place])   . Complication of anesthesia    wife states pt had trouble waking up with his last surgery in Nov., 2014  . DEPRESSION   . DIABETES MELLITUS, TYPE II   . ERECTILE DYSFUNCTION   . ESRD on hemodialysis ([place])    ESRD due to DM/HTN. Started dialysis in November 2013.  HD TTS at [facility] [place].  [name] GERD   . Hemorrhoids   . HEPATITIS C, HX OF   . History of Clostridium difficile   . HYPERTENSION   . LBBB (left bundle branch block)   . Morbid obesity ([place])   . NEPHROLITHIASIS, HX OF   . PAF (paroxysmal atrial fibrillation) ([place])    a. Dx 12/2015.  . S/P bilateral BKA (below knee amputation) [facility])     Past Surgical History:  Procedure Laterality Date  . AMPUTATION Left 05/12/2013   Procedure: AMPUTATION RAY;  Surgeon: Newt Minion, MD;  Location: [place];  Service: Orthopedics;  Laterality:  Left;  Left Foot 1st Ray Amputation  . AMPUTATION Left 06/09/2013   Procedure: AMPUTATION BELOW KNEE;  Surgeon: Newt Minion, MD;  Location: [place];  Service: Orthopedics;  Laterality: Left;  Left Below Knee Amputation and removal proximal screws IM tibial nail  . AMPUTATION Right 09/08/2013   Procedure: AMPUTATION BELOW KNEE;  Surgeon: Newt Minion, MD;  Location: [place];  Service: Orthopedics;  Laterality: Right;  Right Below Knee Amputation  . AMPUTATION Right 10/11/2013   Procedure: AMPUTATION BELOW KNEE;  Surgeon: Newt Minion, MD;  Location: [place];  Service: Orthopedics;  Laterality: Right;  Right Below Knee Amputation Revision  . AV FISTULA PLACEMENT  06/14/2012   Procedure: ARTERIOVENOUS (AV) FISTULA CREATION;  Surgeon: Angelia Mould, MD;  Location: [facility] OR;  Service: Vascular;  Laterality: Left;  Left basilic vein transposition with fistula.  . COLONOSCOPY N/A 10/28/2012   Procedure: COLONOSCOPY;  Surgeon: Jeryl Columbia, MD;  Location: [facility] ENDOSCOPY;  Service: Endoscopy;  Laterality: N/A;  . COLONOSCOPY N/A 11/02/2012   Procedure: COLONOSCOPY;  Surgeon: Cleotis Nipper, MD;  Location: [facility] ENDOSCOPY;  Service: Endoscopy;  Laterality: N/A;  . COLONOSCOPY N/A 11/03/2012   Procedure: COLONOSCOPY;  Surgeon: Cleotis Nipper, MD;  Location: [facility] ENDOSCOPY;  Service: Endoscopy;  Laterality: N/A;  . COLONOSCOPY N/A 09/16/2016   Procedure: COLONOSCOPY;  Surgeon: Jerene Bears, MD;  Location: [facility] ENDOSCOPY;  Service: Gastroenterology;  Laterality: N/A;  . ENTEROSCOPY N/A 11/08/2012   Procedure: ENTEROSCOPY;  Surgeon: Wonda Horner, MD;  Location: [facility] ENDOSCOPY;  Service: Endoscopy;  Laterality: N/A;  . ESOPHAGOGASTRODUODENOSCOPY N/A 11/02/2012   Procedure: ESOPHAGOGASTRODUODENOSCOPY (EGD);  Surgeon: Cleotis Nipper, MD;  Location: [facility] ENDOSCOPY;  Service: Endoscopy;  Laterality: N/A;  . ESOPHAGOGASTRODUODENOSCOPY (EGD) WITH PROPOFOL N/A 09/16/2016   Procedure: ESOPHAGOGASTRODUODENOSCOPY (EGD) WITH PROPOFOL;   Surgeon: Jerene Bears, MD;  Location: [facility] ENDOSCOPY;  Service: Gastroenterology;  Laterality: N/A;  . EYE SURGERY Left    to remove scar tissue  . GIVENS CAPSULE STUDY N/A 11/04/2012   Procedure: GIVENS CAPSULE STUDY;  Surgeon: Cleotis Nipper, MD;  Location: [facility] ENDOSCOPY;  Service: Endoscopy;  Laterality: N/A;  . HARDWARE REMOVAL Left 06/09/2013   Procedure: HARDWARE REMOVAL;  Surgeon: Newt Minion, MD;  Location: [place];  Service: Orthopedics;  Laterality: Left;  Left Below Knee Amputation  and Removal proximal screws IM tibial nail  . NEPHRECTOMY     partial RR  . ORIF FIBULA FRACTURE Left 09/09/2012   Procedure: OPEN REDUCTION INTERNAL FIXATION (ORIF) FIBULA FRACTURE;  Surgeon: Johnny Bridge, MD;  Location: [place];  Service: Orthopedics;  Laterality: Left;  . TIBIA IM NAIL INSERTION Left 09/09/2012   Procedure: INTRAMEDULLARY (IM) NAIL TIBIAL;  Surgeon: Johnny Bridge, MD;  Location: [place];  Service: Orthopedics;  Laterality: Left;  left tibial nail and open reduction internal fixation left fibula fracture      Family History  Problem Relation Age of Onset  . Diabetes Mother   . Hypertension Mother   . Heart attack Father   . Hypertension Father   . Coronary artery disease Other       reports that he has never smoked. He has never used smokeless tobacco. He reports that he does not drink alcohol or use drugs.  No Known Allergies  Prior to Admission medications   Medication Sig Start Date End Date Taking? Authorizing Provider  acetaminophen (TYLENOL) 500 MG tablet Take 500 mg by mouth every 6 (six) hours as needed for mild pain.   Yes Historical Provider, MD  atorvastatin (LIPITOR) 40 MG tablet TAKE 1 TABLET(40 MG) BY MOUTH DAILY AT 6 PM 08/10/16  Yes [name], MD  calcium acetate (PHOSLO) 667 MG capsule Take 2,001 mg by mouth 3 (three) times daily with meals.   Yes Historical Provider, MD  cyclobenzaprine (FLEXERIL) 5 MG tablet Take 1 tablet (5 mg total) by mouth 3 (three)  times daily as needed for muscle spasms. 07/08/16  Yes [name], MD  diltiazem (CARDIZEM CD) 120 MG 24 hr capsule Take 1 capsule (120 mg total) by mouth daily. 01/09/16  Yes [name], MD  hydrALAZINE (APRESOLINE) 100 MG tablet TAKE 1 TABLET BY MOUTH EVERY 8 HOURS 07/07/16  Yes [name], MD  HYDROcodone-acetaminophen (NORCO/VICODIN) 5-325 MG tablet Take 1 tablet by mouth every 6 (six) hours as needed for moderate pain.   Yes Historical Provider, MD  hydrOXYzine (ATARAX/VISTARIL) 25 MG tablet TAKE 1 TABLET(25 MG) BY MOUTH EVERY 6 HOURS AS NEEDED Patient taking differently: TAKE 1 TABLET(25 MG) BY MOUTH EVERY 6 HOURS AS NEEDED FOR ANXIETY 07/25/16  Yes [name], MD  ketoconazole (NIZORAL) 2 % cream Apply 1 application topically daily as  needed for irritation. 09/16/16  Yes Barton Dubois, MD  latanoprost (XALATAN) 0.005 % ophthalmic solution Place 1 drop into both eyes at bedtime.  06/12/16  Yes Historical Provider, MD  lidocaine-prilocaine (EMLA) cream APPLY A SMALL AMOUNT TO SKIN AT THE ACCESS SITE (AVF) AS DIRECTED BEFORE EACH DIALYSIS SESSION THREE DAYS A WEEK 06/03/15  Yes Historical Provider, MD  metoprolol succinate (TOPROL-XL) 100 MG 24 hr tablet Take 1 tablet (100 mg total) by mouth daily. Take with or immediately following a meal. 01/09/16  Yes [name], MD  ondansetron (ZOFRAN ODT) 4 MG disintegrating tablet 68m ODT q4 hours prn nausea/vomit 06/16/16  Yes JMilton Ferguson MD  pantoprazole (PROTONIX) 40 MG tablet Take 1 tablet (40 mg total) by mouth 2 (two) times daily before a meal. 09/16/16  Yes CBarton Dubois MD  polyethylene glycol (MIRALAX / GLYCOLAX) packet Take 17 g by mouth 2 (two) times daily. 09/16/16  Yes CBarton Dubois MD  QUEtiapine (SEROQUEL) 25 MG tablet Take 1 tablet (25 mg total) by mouth 2 (two) times daily. 04/07/16  Yes JBiagio Borg MD  SENSIPAR 30 MG tablet Take 30 mg by mouth every evening.  01/09/15  Yes Historical Provider, MD  silver sulfADIAZINE (SILVADENE) 1  % cream Apply 1 application topically daily as needed (irratation). 09/16/16  Yes CBarton Dubois MD  traMADol (ULTRAM) 50 MG tablet Take 1 tablet (50 mg total) by mouth every 8 (eight) hours as needed. Patient taking differently: Take 50 mg by mouth every 8 (eight) hours.  06/01/16  Yes JBiagio Borg MD  triamcinolone cream (KENALOG) 0.1 % Apply 1 application topically as needed (irritation).  07/17/16  Yes Historical Provider, MD  VOLTAREN 1 % GEL APPLY 2 GRAMS TO HANDS BID TO TID AS NEEDED FOR PAIN. 09/19/15  Yes Historical Provider, MD  warfarin (COUMADIN) 2.5 MG tablet Take 3.75 Mg daily, except for Sundays when you will take 2.5 Mg Patient taking differently: Take 2.5-3.75 mg by mouth See admin instructions. Take 3.75 Mg daily, except for Sundays when you will take 2.5 Mg 09/16/16  Yes CBarton Dubois MD     Anti-infectives    None      Results for orders placed or performed during the hospital encounter of 09/26/16 (from the past 48 hour(s))  Type and screen [place]    Status: None   Collection Time: 09/26/16 11:15 AM  Result Value Ref Range   ABO/RH(D) O POS    Antibody Screen NEG    Sample Expiration 09/29/2016   Comprehensive metabolic panel     Status: Abnormal   Collection Time: 09/26/16 11:19 AM  Result Value Ref Range   Sodium 134 (L) 135 - 145 mmol/L   Potassium 4.0 3.5 - 5.1 mmol/L   Chloride 97 (L) 101 - 111 mmol/L   CO2 27 22 - 32 mmol/L   Glucose, Bld 106 (H) 65 - 99 mg/dL   BUN 31 (H) 6 - 20 mg/dL   Creatinine, Ser 5.18 (H) 0.61 - 1.24 mg/dL   Calcium 9.2 8.9 - 10.3 mg/dL   Total Protein 7.5 6.5 - 8.1 g/dL   Albumin 3.5 3.5 - 5.0 g/dL   AST 14 (L) 15 - 41 U/L   ALT 12 (L) 17 - 63 U/L   Alkaline Phosphatase 37 (L) 38 - 126 U/L   Total Bilirubin 0.5 0.3 - 1.2 mg/dL   GFR calc non Af Amer 11 (L) >60 mL/min   GFR calc Af AWyvonnia Lora  12 (L) >60 mL/min    Comment: (NOTE) The eGFR has been calculated using the CKD EPI equation. This calculation has not  been validated in all clinical situations. eGFR's persistently <60 mL/min signify possible Chronic Kidney Disease.    Anion gap 10 5 - 15  CBC with Differential     Status: Abnormal   Collection Time: 09/26/16 11:19 AM  Result Value Ref Range   WBC 8.5 4.0 - 10.5 K/uL   RBC 3.45 (L) 4.22 - 5.81 MIL/uL   Hemoglobin 9.8 (L) 13.0 - 17.0 g/dL   HCT 30.9 (L) 39.0 - 52.0 %   MCV 89.6 78.0 - 100.0 fL   MCH 28.4 26.0 - 34.0 pg   MCHC 31.7 30.0 - 36.0 g/dL   RDW 15.7 (H) 11.5 - 15.5 %   Platelets 198 150 - 400 K/uL   Neutrophils Relative % 66 %   Neutro Abs 5.7 1.7 - 7.7 K/uL   Lymphocytes Relative 27 %   Lymphs Abs 2.3 0.7 - 4.0 K/uL   Monocytes Relative 6 %   Monocytes Absolute 0.5 0.1 - 1.0 K/uL   Eosinophils Relative 1 %   Eosinophils Absolute 0.1 0.0 - 0.7 K/uL   Basophils Relative 0 %   Basophils Absolute 0.0 0.0 - 0.1 K/uL  Lipase, blood     Status: None   Collection Time: 09/26/16 11:19 AM  Result Value Ref Range   Lipase 29 11 - 51 U/L  Protime-INR     Status: Abnormal   Collection Time: 09/26/16 11:51 AM  Result Value Ref Range   Prothrombin Time 34.9 (H) 11.4 - 15.2 seconds   INR 3.37   POC occult blood, ED Provider will collect     Status: Abnormal   Collection Time: 09/26/16 12:24 PM  Result Value Ref Range   Fecal Occult Bld POSITIVE (A) NEGATIVE     ROS: see hpi  Physical Exam: Vitals:   09/26/16 1315 09/26/16 1330  BP: 100/78 (!) 88/71  Pulse:    Resp: 14   Temp:       General: Elderly AAM  Chronically ill appearing  NAD  Soft spoken  Alert  OX3 / wife in er room  HEENT:  , MM dry , EOMI  Neck: supple , no jvd Heart:  RRR no m, r g  Lungs: CTA  Abdomen: BS pos. Soft , NT, ND Extremities:  bilat BKAs No edema Skin: warm dry Neuro: alert OX4 no overt focal deficits , moves all extrem  Dialysis Access: Pos bruit LUA AVF  Dialysis Orders: Center: [facility]  on TTS . EDW 84kg HD Bath 2k, 2.25ca  Time 3hrs 74mn Heparin 3400 . Access LUA AVF  BFR 450   DFR 800       Other op labs  HGB 10.6 09/24/16  Ca 10.6  Phos 3.3 pth62   Assessment/Plan 1. GI Bleed = wu per admit / no hep 2. ESRD -  HD on TTS  K= 4.0 no hd needs today (full hd today at op center) 3. Hypertension/volume  - bp lowish  Post hd today /? Gi bleed on BP meds  Hydralazine / metoprolol/ Cardizem  Would hold until bp stable 4. Anemia  - on No ESA  As op secc Prior HGB >11 fu trend  Start next HD tues if needed.  5. Metabolic bone disease -  No vit d on hd / po phos binder when eating phoslo  3 tid meals / Corec ca 9.6 /  6. DM type 2- per admit  7. Ho CVA 8. HO Bilat BKas- uses wheelchair at home per wife  Ernest Haber, Hershal Coria [place] [phone] 09/26/2016, 2:28 PM

## 2016-09-27 ENCOUNTER — Inpatient Hospital Stay (HOSPITAL_COMMUNITY): Payer: Medicare Other

## 2016-09-27 ENCOUNTER — Encounter (HOSPITAL_COMMUNITY): Payer: Self-pay | Admitting: Interventional Radiology

## 2016-09-27 HISTORY — PX: IR GENERIC HISTORICAL: IMG1180011

## 2016-09-27 LAB — CBC
HCT: 26.1 % — ABNORMAL LOW (ref 39.0–52.0)
HEMOGLOBIN: 8.3 g/dL — AB (ref 13.0–17.0)
MCH: 28.2 pg (ref 26.0–34.0)
MCHC: 31.8 g/dL (ref 30.0–36.0)
MCV: 88.8 fL (ref 78.0–100.0)
PLATELETS: 186 10*3/uL (ref 150–400)
RBC: 2.94 MIL/uL — AB (ref 4.22–5.81)
RDW: 15.6 % — ABNORMAL HIGH (ref 11.5–15.5)
WBC: 6.4 10*3/uL (ref 4.0–10.5)

## 2016-09-27 LAB — BASIC METABOLIC PANEL
ANION GAP: 10 (ref 5–15)
BUN: 37 mg/dL — ABNORMAL HIGH (ref 6–20)
CALCIUM: 8.6 mg/dL — AB (ref 8.9–10.3)
CO2: 28 mmol/L (ref 22–32)
Chloride: 96 mmol/L — ABNORMAL LOW (ref 101–111)
Creatinine, Ser: 6.6 mg/dL — ABNORMAL HIGH (ref 0.61–1.24)
GFR calc non Af Amer: 8 mL/min — ABNORMAL LOW (ref >60)
GFR, EST AFRICAN AMERICAN: 9 mL/min — AB (ref >60)
Glucose, Bld: 113 mg/dL — ABNORMAL HIGH (ref 65–99)
Potassium: 3.9 mmol/L (ref 3.5–5.1)
SODIUM: 134 mmol/L — AB (ref 135–145)

## 2016-09-27 LAB — GLUCOSE, CAPILLARY
GLUCOSE-CAPILLARY: 98 mg/dL (ref 65–99)
Glucose-Capillary: 158 mg/dL — ABNORMAL HIGH (ref 65–99)
Glucose-Capillary: 150 mg/dL — ABNORMAL HIGH (ref 65–99)

## 2016-09-27 LAB — PROTIME-INR
INR: 3.69
Prothrombin Time: 37.5 seconds — ABNORMAL HIGH (ref 11.4–15.2)

## 2016-09-27 MED ORDER — LIDOCAINE HCL (PF) 1 % IJ SOLN
INTRAMUSCULAR | Status: AC
  Filled 2016-09-27: qty 10

## 2016-09-27 NOTE — Progress Notes (Signed)
[facility] ROUNDING NOTE   Subjective:   Interval History:Oscar Castillo is a 66 y.o. male with HO CVA  2009/On chronic HD sec DM on  HD  TTS )( [facility] admitted after complete HD today with GI Bleed.Hemesis x2 at out pt center today and reports dark stools past week . Has ho paroxysmal A. fib on Coumadin  And colonoscopy  09/16/2016 =no evidence of GI bleed.? upper endoscopy on the same date that showed a gastric ulcer. He was seen by GI Dr. Hilarie Fredrickson.   In the ER Hemoccult is positive. INR is 3.37   Objective:  Vital signs in last 24 hours:  Temp:  [98.2 F (36.8 C)-98.8 F (37.1 C)] 98.2 F (36.8 C) (03/04 0521) Pulse Rate:  [76-99] 76 (03/04 0521) Resp:  [14-16] 16 (03/04 0521) BP: (78-104)/(34-78) 80/41 (03/04 0638) SpO2:  [96 %-100 %] 100 % (03/04 0521)  Weight change:  There were no vitals filed for this visit.  Intake/Output: I/O last 3 completed shifts: In: 600 [P.O.:600] Out: -    Intake/Output this shift:  Total I/O In: 120 [P.O.:120] Out: -   CVS- RRR RS- CTA ABD- BS present soft non-distended EXT- no edema LUAF   Basic Metabolic Panel:  Recent Labs Lab 09/26/16 1119 09/27/16 0312  NA 134* 134*  K 4.0 3.9  CL 97* 96*  CO2 27 28  GLUCOSE 106* 113*  BUN 31* 37*  CREATININE 5.18* 6.60*  CALCIUM 9.2 8.6*    Liver Function Tests:  Recent Labs Lab 09/26/16 1119  AST 14*  ALT 12*  ALKPHOS 37*  BILITOT 0.5  PROT 7.5  ALBUMIN 3.5    Recent Labs Lab 09/26/16 1119  LIPASE 29   No results for input(s): AMMONIA in the last 168 hours.  CBC:  Recent Labs Lab 09/26/16 1119 09/26/16 1437 09/26/16 1815 09/26/16 2227 09/27/16 0312  WBC 8.5  --   --   --  6.4  NEUTROABS 5.7  --   --   --   --   HGB 9.8* 7.5* 8.6* 8.6* 8.3*  HCT 30.9* 23.6* 27.9* 27.2* 26.1*  MCV 89.6  --   --   --  88.8  PLT 198  --   --   --  186    Cardiac Enzymes: No results for input(s): CKTOTAL, CKMB, CKMBINDEX, TROPONINI in the last 168  hours.  BNP: Invalid input(s): POCBNP  CBG:  Recent Labs Lab 09/26/16 2126 09/27/16 0543  GLUCAP 126* 77    Microbiology: Results for orders placed or performed in visit on 08/26/16  Fecal occult blood, imunochemical (IFOB)     Status: Abnormal   Collection Time: 08/26/16 10:27 AM  Result Value Ref Range Status   Fecal Occult Bld Positive (A) Negative Final    Coagulation Studies:  Recent Labs  09/26/16 1151 09/27/16 0312  LABPROT 34.9* 37.5*  INR 3.37 3.69    Urinalysis: No results for input(s): COLORURINE, LABSPEC, PHURINE, GLUCOSEU, HGBUR, BILIRUBINUR, KETONESUR, PROTEINUR, UROBILINOGEN, NITRITE, LEUKOCYTESUR in the last 72 hours.  Invalid input(s): APPERANCEUR    Imaging: No results found.   Medications:    . atorvastatin  40 mg Oral q1800  . calcium acetate  2,001 mg Oral TID WC  . cinacalcet  30 mg Oral QPM  . diltiazem  120 mg Oral Daily  . latanoprost  1 drop Both Eyes QHS  . pantoprazole (PROTONIX) IV  40 mg Intravenous Q12H  . QUEtiapine  25 mg Oral BID  .  sodium chloride  250 mL Intravenous Once   acetaminophen **OR** acetaminophen, cyclobenzaprine, hydrOXYzine, ondansetron **OR** ondansetron (ZOFRAN) IV, traMADol  Assessment/ Plan:  Assessment/Plan 1. GI Bleed = wu per admit / no hep 2. ESRD -  HD on TTS   3. Hypertension/volume  - bp lowish  Post hd today /? Gi bleed on BP meds  Hydralazine / metoprolol/ Cardizem   -- only diltiazem in hospital  4. Anemia  - on No ESA  As op secc Prior HGB >11 fu trend  Start next HD tues if needed.  Current Hb 8.3  5. Metabolic bone disease -  No vit d on hd / po phos binder when eating phoslo  3 tid meals / Corec ca 9.6 /  6. DM type 2- per admit  7. Ho CVA 8. HO Bilat BKas- uses wheelchair at home per wife   LOS: 1 Oscar Castillo @TODAY @10 :18 AM

## 2016-09-27 NOTE — Progress Notes (Signed)
Spoke with Dr [name] Mend regarding PICC order.  States to recommend a CVC or IJ but not a PICC due to HD.  Dr Doyle Askew notified via telephone.

## 2016-09-27 NOTE — Progress Notes (Signed)
Patient ID: Oscar Castillo, male   DOB: [birth year], 66 y.o.   MRN: [id number]    PROGRESS NOTE   Oscar Castillo  [id number] DOB: Feb 05, 1951 DOA: 09/26/2016  PCP: [name], MD   Brief Narrative:  66 y.o. male  with diabetes, hepatitis C, ESRD on HD TTS, left bundle-branch block, atrial fibrillation, chronic anticoagulation presented to the hospital with black stools that he first noticed several days prior to this admission. His last HD session was 3/3 and pt came right to the ED after HD. GI and nephrology team consulted for assistance.   Assessment & Plan:   Principal Problem:   Acute blood loss anemia, melena, ? Hematemesis  - in the setting of Coumadin use - GI team has been consulted - patient has had an EGD within the last week and therefore per GI doctor currently no need to repeat an EGD at this time - colonoscopy was essentially normal except for a 4 mm polyp was removed -  For now, plan is to monitor symptoms closely and if pt continues to have drop in Hg, small bowel capsule study might be helpful  Active Problems:   Essential hypertension - currently hypotensive - pt on Hydralazine, Metoprolol, Cardizem at home - will hold all antihypertensives for now until BP stabilizes     DM (diabetes mellitus) type I controlled with renal manifestation ([place]) - pt NPO for now - keep on SSI     Pressure injury of skin - WOC consult     Bilateral BKA - stable for now, no edema noted on exam, no TTP    DVT prophylaxis: Coumadin held for now due to high risk bleed  Code Status: Full  Family Communication: Patient and wife at bedside  Disposition Plan: To be determined   Consultants:   Nephrology   GI  IR for access placement   Procedures:   Right IJ CVC placed for access by IR 3/4   Antimicrobials:   None   Subjective: No events overnight.   Objective: Vitals:   09/26/16 1800 09/26/16 2018 09/27/16 0521 09/27/16 0638  BP: (!) 100/34 104/65 (!) 78/46 (!)  80/41  Pulse:  79 76   Resp:  16 16   Temp:  98.8 F (37.1 C) 98.2 F (36.8 C)   TempSrc:  Oral Oral   SpO2:  100% 100%     Intake/Output Summary (Last 24 hours) at 09/27/16 1244 Last data filed at 09/27/16 0900  Gross per 24 hour  Intake              720 ml  Output                0 ml  Net              720 ml   There were no vitals filed for this visit.  Examination:  General exam: Appears calm and comfortable  Respiratory system: Respiratory effort normal. Cardiovascular system: S1 & S2 heard, RRR. No JVD, rubs, gallops or clicks.  Gastrointestinal system: Abdomen is nondistended, soft and nontender. No organomegaly or masses felt. Normal bowel sounds heard. Central nervous system: Alert and oriented. No focal neurological deficits. Extremities: B BKA  Data Reviewed: I have personally reviewed following labs and imaging studies  CBC:  Recent Labs Lab 09/26/16 1119 09/26/16 1437 09/26/16 1815 09/26/16 2227 09/27/16 0312  WBC 8.5  --   --   --  6.4  NEUTROABS 5.7  --   --   --   --  HGB 9.8* 7.5* 8.6* 8.6* 8.3*  HCT 30.9* 23.6* 27.9* 27.2* 26.1*  MCV 89.6  --   --   --  88.8  PLT 198  --   --   --  99991111   Basic Metabolic Panel:  Recent Labs Lab 09/26/16 1119 09/27/16 0312  NA 134* 134*  K 4.0 3.9  CL 97* 96*  CO2 27 28  GLUCOSE 106* 113*  BUN 31* 37*  CREATININE 5.18* 6.60*  CALCIUM 9.2 8.6*   Liver Function Tests:  Recent Labs Lab 09/26/16 1119  AST 14*  ALT 12*  ALKPHOS 37*  BILITOT 0.5  PROT 7.5  ALBUMIN 3.5    Recent Labs Lab 09/26/16 1119  LIPASE 29   Coagulation Profile:  Recent Labs Lab 09/21/16 1546 09/23/16 1534 09/26/16 1151 09/27/16 0312  INR 1.7 3.3 3.37 3.69   CBG:  Recent Labs Lab 09/26/16 2126 09/27/16 0543  GLUCAP 126* 98   Urine analysis:    Component Value Date/Time   COLORURINE YELLOW 05/15/2013 0007   APPEARANCEUR CLEAR 05/15/2013 0007   LABSPEC 1.014 05/15/2013 0007   PHURINE 8.5 (H) 05/15/2013  0007   GLUCOSEU 250 (A) 05/15/2013 0007   GLUCOSEU 500 (?) 11/06/2009 0948   HGBUR NEGATIVE 05/15/2013 0007   BILIRUBINUR NEGATIVE 05/15/2013 0007   KETONESUR NEGATIVE 05/15/2013 0007   PROTEINUR >300 (A) 05/15/2013 0007   UROBILINOGEN 0.2 05/15/2013 0007   NITRITE NEGATIVE 05/15/2013 0007   LEUKOCYTESUR NEGATIVE 05/15/2013 0007   Radiology Studies: No results found.  Scheduled Meds: . lidocaine (PF)      . atorvastatin  40 mg Oral q1800  . calcium acetate  2,001 mg Oral TID WC  . cinacalcet  30 mg Oral QPM  . diltiazem  120 mg Oral Daily  . latanoprost  1 drop Both Eyes QHS  . pantoprazole (PROTONIX) IV  40 mg Intravenous Q12H  . QUEtiapine  25 mg Oral BID  . sodium chloride  250 mL Intravenous Once   Continuous Infusions:   LOS: 1 day    Time spent: 20 minutes    Faye Ramsay, MD [facility] Pager [phone]  If 7PM-7AM, please contact night-coverage [email or web address] Password [facility] 09/27/2016, 12:44 PM

## 2016-09-27 NOTE — Procedures (Signed)
Interventional Radiology Procedure Note  Procedure:  Right IJ CVC  Complications: None  Estimated Blood Loss: < 10 mL  15 cm DL Power injectable catheter via right IJ with tip at SVC/RA junction.  Oscar Castillo. Oscar Castillo, M.D Pager:  [phone]

## 2016-09-27 NOTE — Progress Notes (Signed)
Cross cover [facility]GI Subjective: Since I last evaluated the patient, there has not been much of a change iis overall condition. He denies having any abdominal pain nausea vomiting He had a bowel movement today with no evidence of melena hematochezia his tolerating his full liquid diet well.  Objective: Vital signs in last 24 hours: Temp:  [98.2 F (36.8 C)-98.8 F (37.1 C)] 98.2 F (36.8 C) (03/04 0521) Pulse Rate:  [76-92] 76 (03/04 0521) Resp:  [14-16] 16 (03/04 0521) BP: (78-104)/(34-78) 80/41 (03/04 0638) SpO2:  [100 %] 100 % (03/04 0521) Last BM Date: 09/25/16  Intake/Output from previous day: 03/03 0701 - 03/04 0700 In: 600 [P.O.:600] Out: -  Intake/Output this shift: Total I/O In: 120 [P.O.:120] Out: -    Alert, oriented x 3 in NAD Resp: clear to auscultation bilaterally Cardio: regular rate and rhythm, S1, S2 normal, no murmur, click, rub or gallop GI: soft, non-tender; bowel sounds normal; no masses,  no organomegaly Patient is s/p bilateral BKA's Lab Results:  Recent Labs  09/26/16 1119  09/26/16 1815 09/26/16 2227 09/27/16 0312  WBC 8.5  --   --   --  6.4  HGB 9.8*  < > 8.6* 8.6* 8.3*  HCT 30.9*  < > 27.9* 27.2* 26.1*  PLT 198  --   --   --  186  < > = values in this interval not displayed. BMET  Recent Labs  09/26/16 1119 09/27/16 0312  NA 134* 134*  K 4.0 3.9  CL 97* 96*  CO2 27 28  GLUCOSE 106* 113*  BUN 31* 37*  CREATININE 5.18* 6.60*  CALCIUM 9.2 8.6*   LFT  Recent Labs  09/26/16 1119  PROT 7.5  ALBUMIN 3.5  AST 14*  ALT 12*  ALKPHOS 37*  BILITOT 0.5   PT/INR  Recent Labs  09/26/16 1151 09/27/16 0312  LABPROT 34.9* 37.5*  INR 3.37 3.69    Medications: I have reviewed the patient's current medications.  Assessment/Plan: 1) Acute blood loss anemia with an episode of hematemesis and guaiac-positive stools in a 66 year old male status post EGD and colonoscopy within the last week. Plans are to watch him closely for now and  he says he blood counts dropped small bowel study will be helpful.    LOS: 1 day   Jantzen Pilger 09/27/2016, 10:56 AM

## 2016-09-28 DIAGNOSIS — K921 Melena: Secondary | ICD-10-CM

## 2016-09-28 LAB — GLUCOSE, CAPILLARY
GLUCOSE-CAPILLARY: 104 mg/dL — AB (ref 65–99)
GLUCOSE-CAPILLARY: 185 mg/dL — AB (ref 65–99)
Glucose-Capillary: 151 mg/dL — ABNORMAL HIGH (ref 65–99)
Glucose-Capillary: 117 mg/dL — ABNORMAL HIGH (ref 65–99)

## 2016-09-28 LAB — CBC
HCT: 23.5 % — ABNORMAL LOW (ref 39.0–52.0)
HEMOGLOBIN: 7.7 g/dL — AB (ref 13.0–17.0)
MCH: 28.6 pg (ref 26.0–34.0)
MCHC: 32.8 g/dL (ref 30.0–36.0)
MCV: 87.4 fL (ref 78.0–100.0)
PLATELETS: 212 10*3/uL (ref 150–400)
RBC: 2.69 MIL/uL — AB (ref 4.22–5.81)
RDW: 15.4 % (ref 11.5–15.5)
WBC: 5.5 10*3/uL (ref 4.0–10.5)

## 2016-09-28 LAB — RENAL FUNCTION PANEL
ANION GAP: 8 (ref 5–15)
Albumin: 3 g/dL — ABNORMAL LOW (ref 3.5–5.0)
BUN: 50 mg/dL — ABNORMAL HIGH (ref 6–20)
CALCIUM: 8.1 mg/dL — AB (ref 8.9–10.3)
CO2: 26 mmol/L (ref 22–32)
CREATININE: 8.86 mg/dL — AB (ref 0.61–1.24)
Chloride: 98 mmol/L — ABNORMAL LOW (ref 101–111)
GFR, EST AFRICAN AMERICAN: 6 mL/min — AB (ref >60)
GFR, EST NON AFRICAN AMERICAN: 6 mL/min — AB (ref >60)
Glucose, Bld: 109 mg/dL — ABNORMAL HIGH (ref 65–99)
PHOSPHORUS: 4.7 mg/dL — AB (ref 2.5–4.6)
Potassium: 4 mmol/L (ref 3.5–5.1)
SODIUM: 132 mmol/L — AB (ref 135–145)

## 2016-09-28 MED ORDER — PEG-KCL-NACL-NASULF-NA ASC-C 100 G PO SOLR
1.0000 | Freq: Once | ORAL | Status: AC
  Administered 2016-09-28: 200 g via ORAL
  Filled 2016-09-28: qty 1

## 2016-09-28 MED ORDER — DARBEPOETIN ALFA 60 MCG/0.3ML IJ SOSY
60.0000 ug | PREFILLED_SYRINGE | INTRAMUSCULAR | Status: DC
  Administered 2016-09-30: 60 ug via INTRAVENOUS
  Filled 2016-09-28: qty 0.3

## 2016-09-28 MED ORDER — BOOST / RESOURCE BREEZE PO LIQD
1.0000 | Freq: Two times a day (BID) | ORAL | Status: DC
  Administered 2016-09-28 – 2016-10-02 (×6): 1 via ORAL

## 2016-09-28 MED ORDER — SODIUM CHLORIDE 0.9% FLUSH
10.0000 mL | INTRAVENOUS | Status: DC | PRN
  Administered 2016-09-28 – 2016-09-30 (×2): 20 mL
  Administered 2016-09-30: 10 mL
  Filled 2016-09-28 (×3): qty 40

## 2016-09-28 NOTE — Progress Notes (Addendum)
  [facility] Progress Note   Subjective: no c/o's, "I'm tired of being here, when can I go home?".  Hb 7.7 today  Vitals:   09/27/16 0638 09/27/16 1300 09/27/16 2039 09/28/16 0533  BP: (!) 80/41 101/66 106/65 (!) 153/86  Pulse:  78 81 80  Resp:  '16 16 16  '$ Temp:  98.1 F (36.7 C) 98.9 F (37.2 C) 98.8 F (37.1 C)  TempSrc:  Oral Oral Oral  SpO2:  100% 100% 100%    Inpatient medications: . atorvastatin  40 mg Oral q1800  . calcium acetate  2,001 mg Oral TID WC  . cinacalcet  30 mg Oral QPM  . diltiazem  120 mg Oral Daily  . latanoprost  1 drop Both Eyes QHS  . pantoprazole (PROTONIX) IV  40 mg Intravenous Q12H  . peg 3350 powder  1 kit Oral Once  . QUEtiapine  25 mg Oral BID  . sodium chloride  250 mL Intravenous Once    acetaminophen **OR** acetaminophen, cyclobenzaprine, hydrOXYzine, ondansetron **OR** ondansetron (ZOFRAN) IV, sodium chloride flush, traMADol  Exam: Alert, no distress No jvd Chest clear bilat RRR no mrg ABd obese, soft ntnd Ext no edema  LUA AVF +bruit NF, ox3  Dialysis: [facility] [place] T[facility] 3h 37mn  84kg  2/2.25 bath  Hep 3400  LUA AVF  - no meds      Assessment: 1. GI bleed - hx same in past, per GI/ primary 2. ESRD HD tts 3. HTN - holding home meds (dilt/ metop/ hydralazine) due to low BP's 4. Anemia of CKD - baseline Hb 11's, now in 8 range, start esa tomorrow w HD 5. MBD cont binder 6. DM2 7. Hx CVA 8. Hx bilat BKA - uses WC at home, lives w his wife  Plan - HD Tuesday, esa w HD tuesday   RKelly SplinterMD CBrooklynpager [phone]  09/28/2016, 12:58 PM    Recent Labs Lab 09/26/16 1119 09/27/16 0312 09/28/16 0605  NA 134* 134* 132*  K 4.0 3.9 4.0  CL 97* 96* 98*  CO2 '27 28 26  '$ GLUCOSE 106* 113* 109*  BUN 31* 37* 50*  CREATININE 5.18* 6.60* 8.86*  CALCIUM 9.2 8.6* 8.1*  PHOS  --   --  4.7*    Recent Labs Lab 09/26/16 1119 09/28/16 0605  AST 14*  --   ALT 12*  --   ALKPHOS 37*  --    BILITOT 0.5  --   PROT 7.5  --   ALBUMIN 3.5 3.0*    Recent Labs Lab 09/26/16 1119  09/26/16 2227 09/27/16 0312 09/28/16 0605  WBC 8.5  --   --  6.4 5.5  NEUTROABS 5.7  --   --   --   --   HGB 9.8*  < > 8.6* 8.3* 7.7*  HCT 30.9*  < > 27.2* 26.1* 23.5*  MCV 89.6  --   --  88.8 87.4  PLT 198  --   --  186 212  < > = values in this interval not displayed. Iron/TIBC/Ferritin/ %Sat    Component Value Date/Time   IRON 21 (L) 09/15/2012 1400   TIBC 182 (L) 09/15/2012 1400   FERRITIN 306 09/15/2012 1400   IRONPCTSAT 12 (L) 09/15/2012 1400

## 2016-09-28 NOTE — Progress Notes (Signed)
Daily Rounding Note  09/28/2016, 8:25 AM  LOS: 2 days   SUBJECTIVE:   Chief complaint: black stools and hematemesis.     Black stools yesterday AM and some last night.  No emesis.  Tolerating solids, had breakfast    OBJECTIVE:         Vital signs in last 24 hours:    Temp:  [98.1 F (36.7 C)-98.9 F (37.2 C)] 98.8 F (37.1 C) (03/05 0533) Pulse Rate:  [78-81] 80 (03/05 0533) Resp:  [16] 16 (03/05 0533) BP: (101-153)/(65-86) 153/86 (03/05 0533) SpO2:  [100 %] 100 % (03/05 0533) Last BM Date: 09/25/16 There were no vitals filed for this visit. General: NAD, not acutely ill.  comfortable   Heart: RRR.  NSR on tele Chest: clear bil in front.  No SOB or cough.   Abdomen: soft, NT, active BS.  ND  Extremities: bil AKA Neuro/Psych:  Oriented x 3.  No gross deficits.   Intake/Output from previous day: 03/04 0701 - 03/05 0700 In: 600 [P.O.:600] Out: 2 [Stool:2]  Intake/Output this shift: No intake/output data recorded.  Lab Results:  Recent Labs  09/26/16 1119  09/26/16 2227 09/27/16 0312 09/28/16 0605  WBC 8.5  --   --  6.4 5.5  HGB 9.8*  < > 8.6* 8.3* 7.7*  HCT 30.9*  < > 27.2* 26.1* 23.5*  PLT 198  --   --  186 212  < > = values in this interval not displayed. BMET  Recent Labs  09/26/16 1119 09/27/16 0312 09/28/16 0605  NA 134* 134* 132*  K 4.0 3.9 4.0  CL 97* 96* 98*  CO2 27 28 26   GLUCOSE 106* 113* 109*  BUN 31* 37* 50*  CREATININE 5.18* 6.60* 8.86*  CALCIUM 9.2 8.6* 8.1*   LFT  Recent Labs  09/26/16 1119 09/28/16 0605  PROT 7.5  --   ALBUMIN 3.5 3.0*  AST 14*  --   ALT 12*  --   ALKPHOS 37*  --   BILITOT 0.5  --    PT/INR  Recent Labs  09/26/16 1151 09/27/16 0312  LABPROT 34.9* 37.5*  INR 3.37 3.69   Hepatitis Panel No results for input(s): HEPBSAG, HCVAB, HEPAIGM, HEPBIGM in the last 72 hours.  Studies/Results: Ir Fluoro Guide Cv Line Right  Result Date:  09/27/2016 CLINICAL DATA:  Poor intravenous access and need for central line. EXAM: NON-TUNNELED CENTRAL VENOUS CATHETER PLACEMENT WITH ULTRASOUND AND FLUOROSCOPIC GUIDANCE FLUOROSCOPY TIME:  18 seconds.  1.0 mGy. PROCEDURE: The procedure, risks, benefits, and alternatives were explained to the patient's wife. Questions regarding the procedure were encouraged and answered. The patient's wife understands and consents to the procedure. The right neck was prepped with chlorhexidine in a sterile fashion, and a sterile drape was applied covering the operative field. Maximum barrier sterile technique with sterile gowns and gloves were used for the procedure. Local anesthesia was provided with 1% lidocaine. After creating a small venotomy incision, a 21 gauge needle was advanced into the right internal jugular vein under direct, real-time ultrasound guidance. Ultrasound image documentation was performed. After securing guidewire access, a peel-away sheath was placed over a guide wire. Utilizing guide wire measurement, a power injectable 5 [place], dual-lumen non tunneled catheter was cut to 15 cm. The catheter was then placed through the sheath and the sheath removed. Final catheter positioning was confirmed and documented with a fluoroscopic spot image. The catheter was aspirated, flushed with saline, and  injected with appropriate volume heparin dwells. The catheter exit site was secured with 0-Prolene retention sutures. COMPLICATIONS: None.  No pneumothorax. FINDINGS: After catheter placement, the tip lies at the cavoatrial junction. The catheter aspirates normally and is ready for immediate use. IMPRESSION: Placement of non-tunneled central venous catheter via the right internal jugular vein. The catheter tip lies at the cavoatrial junction. The catheter is ready for immediate use. Electronically Signed   By: Aletta Edouard M.D.   On: 09/27/2016 14:25   Ir US Guide Vasc Access Right  Result Date: 09/27/2016 CLINICAL  DATA:  Poor intravenous access and need for central line. EXAM: NON-TUNNELED CENTRAL VENOUS CATHETER PLACEMENT WITH ULTRASOUND AND FLUOROSCOPIC GUIDANCE FLUOROSCOPY TIME:  18 seconds.  1.0 mGy. PROCEDURE: The procedure, risks, benefits, and alternatives were explained to the patient's wife. Questions regarding the procedure were encouraged and answered. The patient's wife understands and consents to the procedure. The right neck was prepped with chlorhexidine in a sterile fashion, and a sterile drape was applied covering the operative field. Maximum barrier sterile technique with sterile gowns and gloves were used for the procedure. Local anesthesia was provided with 1% lidocaine. After creating a small venotomy incision, a 21 gauge needle was advanced into the right internal jugular vein under direct, real-time ultrasound guidance. Ultrasound image documentation was performed. After securing guidewire access, a peel-away sheath was placed over a guide wire. Utilizing guide wire measurement, a power injectable 5 [place], dual-lumen non tunneled catheter was cut to 15 cm. The catheter was then placed through the sheath and the sheath removed. Final catheter positioning was confirmed and documented with a fluoroscopic spot image. The catheter was aspirated, flushed with saline, and injected with appropriate volume heparin dwells. The catheter exit site was secured with 0-Prolene retention sutures. COMPLICATIONS: None.  No pneumothorax. FINDINGS: After catheter placement, the tip lies at the cavoatrial junction. The catheter aspirates normally and is ready for immediate use. IMPRESSION: Placement of non-tunneled central venous catheter via the right internal jugular vein. The catheter tip lies at the cavoatrial junction. The catheter is ready for immediate use. Electronically Signed   By: Aletta Edouard M.D.   On: 09/27/2016 14:25   Scheduled Meds: . atorvastatin  40 mg Oral q1800  . calcium acetate  2,001 mg Oral  TID WC  . cinacalcet  30 mg Oral QPM  . diltiazem  120 mg Oral Daily  . latanoprost  1 drop Both Eyes QHS  . pantoprazole (PROTONIX) IV  40 mg Intravenous Q12H  . QUEtiapine  25 mg Oral BID  . sodium chloride  250 mL Intravenous Once   Continuous Infusions: PRN Meds:.acetaminophen **OR** acetaminophen, cyclobenzaprine, hydrOXYzine, ondansetron **OR** ondansetron (ZOFRAN) IV, sodium chloride flush, traMADol  ASSESMENT:   *  Hematemesis.  Black stools.   09/16/16 EGD: Grade A esophagitis, erosive gastritis and duodenitis. Path: chronic, inactive, mild gastritis, no H Pylori.   BID IV Protonix in place.  09/16/16 Colonoscopy with 4 mm descending polypectomy (benign polypoid mucosa), "Hard, very dark stool found and removed on digital rectal exam."   In 10/2012 pt had colonoscopy x 2, EGD x 1, tagged scan x 1 for elusive GIB.  Capsule endo 10/2012: ? prox SB ulcer vs submucosal leiomyoma.  CT enterography unrevealing. Enteroscopy 11/08/12: Normal study to 4 feet beyond Ampulla of Vater into Jejunum.     *  ABL anemia.  Also anemia of CKD.  Hgb 9.8...7.5.. 8.6..  8.3.. 7.7.  No transfusions this admission.  Receives parenteral  iron prn at HD  *  Afib, chronic AC with Coumadin on hold.  INR 3.6 on 3/4.    *  ESRD, HD dependent.  *   DM type 2.    *  CHF.    *  S/p bil BKA.  Non-ambulatory.     PLAN   *  ? Repeat the capsule, vs enteroscopy vs angiography?    *  CBC, PT/INR in AM.      Azucena Freed  09/28/2016, 8:25 AM Pager: [phone]

## 2016-09-28 NOTE — Progress Notes (Signed)
Patient ID: Oscar Castillo, male   DOB: 1951/04/23, 66 y.o.   MRN: [id number]    PROGRESS NOTE   Oscar Castillo  [name] DOB: 08/18/1950 DOA: 09/26/2016  PCP: [name], MD   Brief Narrative:  66 y.o. male  with diabetes, hepatitis C, ESRD on HD TTS, left bundle-branch block, atrial fibrillation, chronic anticoagulation presented to the hospital with black stools that he first noticed several days prior to this admission. His last HD session was 3/3 and pt came right to the ED after HD. GI and nephrology team consulted for assistance.   Assessment & Plan:   Principal Problem:   Acute blood loss anemia, melena, ? Hematemesis  - in the setting of Coumadin use - GI team has been consulted - patient has had an EGD within the last week and therefore per GI doctor currently no need to repeat an EGD at this time - colonoscopy was essentially normal except for a 4 mm polyp was removed - Hg down from 8.6 --> 8.3 --. 7.7 this AM - ? Need for capsule endoscopy, follow up on GI team recommendations   Active Problems:   Essential hypertension - pt on Hydralazine, Metoprolol, Cardizem at home - we were holding these medications - SBP now in 100's, resume one at the time as clinically indicated     DM (diabetes mellitus) type I controlled with renal manifestation ([place]) - keep on clear liquids  - keep on SSI     Pressure injury of skin - WOC consult     Bilateral BKA - stable for now, no edema noted on exam, no TTP   DVT prophylaxis: Coumadin held for now due to high risk bleed  Code Status: Full  Family Communication: Patient and wife at bedside  Disposition Plan: To be determined, pending GI team recommendations   Consultants:   Nephrology   GI  IR for access placement   Procedures:   Right IJ CVC placed for access by IR 3/4   Antimicrobials:   None   Subjective: No events overnight.   Objective: Vitals:   09/27/16 1300 09/27/16 2039 09/28/16 0533 09/28/16 1300    BP: 101/66 106/65 (!) 153/86 100/62  Pulse: 78 81 80 74  Resp: _0 Temp: 98.1 F (36.7 C) 98.9 F (37.2 C) 98.8 F (37.1 C) 98.1 F (36.7 C)  TempSrc: Oral Oral Oral Oral  SpO2: 100% 100% 100% 100%    Intake/Output Summary (Last 24 hours) at 09/28/16 1635 Last data filed at 09/28/16 1451  Gross per 24 hour  Intake              720 ml  Output                1 ml  Net              719 ml   There were no vitals filed for this visit.  Examination:  General exam: Appears calm and comfortable  Respiratory system: Respiratory effort normal. Cardiovascular system: S1 & S2 heard, RRR. No JVD, rubs, gallops or clicks.  Gastrointestinal system: Abdomen is nondistended, soft and nontender. No organomegaly or masses felt. Normal bowel sounds heard. Central nervous system: Alert and oriented. No focal neurological deficits. Extremities: B BKA  Data Reviewed: I have personally reviewed following labs and imaging studies  CBC:  Recent Labs Lab 09/26/16 1119 09/26/16 1437 09/26/16 1815 09/26/16 2227 09/27/16 0312 09/28/16 0605  WBC 8.5  --   --   --  6.4 5.5  NEUTROABS 5.7  --   --   --   --   --   HGB 9.8* 7.5* 8.6* 8.6* 8.3* 7.7*  HCT 30.9* 23.6* 27.9* 27.2* 26.1* 23.5*  MCV 89.6  --   --   --  88.8 87.4  PLT 198  --   --   --  186 614   Basic Metabolic Panel:  Recent Labs Lab 09/26/16 1119 09/27/16 0312 09/28/16 0605  NA 134* 134* 132*  K 4.0 3.9 4.0  CL 97* 96* 98*  CO2 _0 GLUCOSE 106* 113* 109*  BUN 31* 37* 50*  CREATININE 5.18* 6.60* 8.86*  CALCIUM 9.2 8.6* 8.1*  PHOS  --   --  4.7*   Liver Function Tests:  Recent Labs Lab 09/26/16 1119 09/28/16 0605  AST 14*  --   ALT 12*  --   ALKPHOS 37*  --   BILITOT 0.5  --   PROT 7.5  --   ALBUMIN 3.5 3.0*    Recent Labs Lab 09/26/16 1119  LIPASE 29   Coagulation Profile:  Recent Labs Lab 09/23/16 1534 09/26/16 1151 09/27/16 0312  INR 3.3 3.37 3.69   CBG:  Recent Labs Lab  09/27/16 0543 09/27/16 1627 09/27/16 2102 09/28/16 0615 09/28/16 1150  GLUCAP 98 158* 150* 104* 185*   Urine analysis:    Component Value Date/Time   COLORURINE YELLOW 05/15/2013 0007   APPEARANCEUR CLEAR 05/15/2013 0007   LABSPEC 1.014 05/15/2013 0007   PHURINE 8.5 (H) 05/15/2013 0007   GLUCOSEU 250 (A) 05/15/2013 0007   GLUCOSEU 500 (?) 11/06/2009 0948   HGBUR NEGATIVE 05/15/2013 0007   BILIRUBINUR NEGATIVE 05/15/2013 0007   KETONESUR NEGATIVE 05/15/2013 0007   PROTEINUR >300 (A) 05/15/2013 0007   UROBILINOGEN 0.2 05/15/2013 0007   NITRITE NEGATIVE 05/15/2013 0007   LEUKOCYTESUR NEGATIVE 05/15/2013 0007   Radiology Studies: Ir Fluoro Guide Cv Line Right  Result Date: 09/27/2016 CLINICAL DATA:  Poor intravenous access and need for central line. EXAM: NON-TUNNELED CENTRAL VENOUS CATHETER PLACEMENT WITH ULTRASOUND AND FLUOROSCOPIC GUIDANCE FLUOROSCOPY TIME:  18 seconds.  1.0 mGy. PROCEDURE: The procedure, risks, benefits, and alternatives were explained to the patient's wife. Questions regarding the procedure were encouraged and answered. The patient's wife understands and consents to the procedure. The right neck was prepped with chlorhexidine in a sterile fashion, and a sterile drape was applied covering the operative field. Maximum barrier sterile technique with sterile gowns and gloves were used for the procedure. Local anesthesia was provided with 1% lidocaine. After creating a small venotomy incision, a 21 gauge needle was advanced into the right internal jugular vein under direct, real-time ultrasound guidance. Ultrasound image documentation was performed. After securing guidewire access, a peel-away sheath was placed over a guide wire. Utilizing guide wire measurement, a power injectable 5 [place], dual-lumen non tunneled catheter was cut to 15 cm. The catheter was then placed through the sheath and the sheath removed. Final catheter positioning was confirmed and documented with a  fluoroscopic spot image. The catheter was aspirated, flushed with saline, and injected with appropriate volume heparin dwells. The catheter exit site was secured with 0-Prolene retention sutures. COMPLICATIONS: None.  No pneumothorax. FINDINGS: After catheter placement, the tip lies at the cavoatrial junction. The catheter aspirates normally and is ready for immediate use. IMPRESSION: Placement of non-tunneled central venous catheter via the right internal jugular vein. The catheter tip lies at the cavoatrial junction. The catheter is ready for immediate use.  Electronically Signed   By: Aletta Edouard M.D.   On: 09/27/2016 14:25   Ir US Guide Vasc Access Right  Result Date: 09/27/2016 CLINICAL DATA:  Poor intravenous access and need for central line. EXAM: NON-TUNNELED CENTRAL VENOUS CATHETER PLACEMENT WITH ULTRASOUND AND FLUOROSCOPIC GUIDANCE FLUOROSCOPY TIME:  18 seconds.  1.0 mGy. PROCEDURE: The procedure, risks, benefits, and alternatives were explained to the patient's wife. Questions regarding the procedure were encouraged and answered. The patient's wife understands and consents to the procedure. The right neck was prepped with chlorhexidine in a sterile fashion, and a sterile drape was applied covering the operative field. Maximum barrier sterile technique with sterile gowns and gloves were used for the procedure. Local anesthesia was provided with 1% lidocaine. After creating a small venotomy incision, a 21 gauge needle was advanced into the right internal jugular vein under direct, real-time ultrasound guidance. Ultrasound image documentation was performed. After securing guidewire access, a peel-away sheath was placed over a guide wire. Utilizing guide wire measurement, a power injectable 5 [place], dual-lumen non tunneled catheter was cut to 15 cm. The catheter was then placed through the sheath and the sheath removed. Final catheter positioning was confirmed and documented with a fluoroscopic spot  image. The catheter was aspirated, flushed with saline, and injected with appropriate volume heparin dwells. The catheter exit site was secured with 0-Prolene retention sutures. COMPLICATIONS: None.  No pneumothorax. FINDINGS: After catheter placement, the tip lies at the cavoatrial junction. The catheter aspirates normally and is ready for immediate use. IMPRESSION: Placement of non-tunneled central venous catheter via the right internal jugular vein. The catheter tip lies at the cavoatrial junction. The catheter is ready for immediate use. Electronically Signed   By: Aletta Edouard M.D.   On: 09/27/2016 14:25    Scheduled Meds: . atorvastatin  40 mg Oral q1800  . calcium acetate  2,001 mg Oral TID WC  . cinacalcet  30 mg Oral QPM  . [START ON 09/29/2016] darbepoetin (ARANESP) injection - DIALYSIS  60 mcg Intravenous Q Tue-HD  . diltiazem  120 mg Oral Daily  . feeding supplement  1 Container Oral BID BM  . latanoprost  1 drop Both Eyes QHS  . pantoprazole (PROTONIX) IV  40 mg Intravenous Q12H  . peg 3350 powder  1 kit Oral Once  . QUEtiapine  25 mg Oral BID  . sodium chloride  250 mL Intravenous Once   Continuous Infusions:   LOS: 2 days    Time spent: 20 minutes    Faye Ramsay, MD [facility] Pager [phone]  If 7PM-7AM, please contact night-coverage [email or web address] Password [facility] 09/28/2016, 4:35 PM

## 2016-09-28 NOTE — Progress Notes (Addendum)
Black stool x1. Previous OBS positive x 2 noted. Gastro notes indicated no evidence of melena hematochezia on BM yesterday.vital signs WNL-BP slightly elevated. Awaiting on morning lab results. hospitalist informed and Gastro paged.   [phone] spoke to Dr. Collene Mares. No orders received.

## 2016-09-28 NOTE — Progress Notes (Signed)
Initial Nutrition Assessment  DOCUMENTATION CODES:   Not applicable  INTERVENTION:  Provide Boost Breeze po BID, each supplement provides 250 kcal and 9 grams of protein.  Encourage adequate PO intake.  Recommend obtaining new weight to fully assess weight trends.  NUTRITION DIAGNOSIS:   Increased nutrient needs related to chronic illness as evidenced by estimated needs.  GOAL:   Patient will meet greater than or equal to 90% of their needs  MONITOR:   PO intake, Supplement acceptance, Labs, Weight trends, Diet advancement, Skin, I & O's  REASON FOR ASSESSMENT:   Low [place]    ASSESSMENT:   66 y.o. male  with diabetes, hepatitis C, ESRD on HD TTS, left bundle-branch block, atrial fibrillation, chronic anticoagulation presented to the hospital with black stools that he first noticed several days prior to this admission. His last HD session was 3/3 and pt came right to the ED after HD.   Pt is currently on a clear liquid diet. Meal completion has been 100% today. Pt reports having a good appetite currently and PTA with usual consumption of at least 3 meals a day with no other difficulties. Pt with no recent weight loss recorded. Recommend obtaining new weight. RD to order nutritional supplements to aid in adequate nutrition as pt is on a clear liquid diet. Pt with no observed significant fat or muscle mass loss.   Labs and medications reviewed. Phosphorous elevated at 4.7.  Diet Order:  Diet clear liquid Room service appropriate? Yes; Fluid consistency: Thin Diet NPO time specified  Skin:  Wound (see comment) (Stage 2 to buttocks)  Last BM:  3/2  Height:   Ht Readings from Last 1 Encounters:  09/16/16 5\' 10"  (1.778 m)    Weight:   Wt Readings from Last 1 Encounters:  09/16/16 230 lb (104.3 kg)    Ideal Body Weight:  65.25 kg (adjusted for bilat BKA)  BMI:  There is no height or weight on file to calculate BMI.  Estimated Nutritional Needs:   Kcal:   1950-2150  Protein:  105-125 grams  Fluid:  Per MD  EDUCATION NEEDS:   No education needs identified at this time  Corrin Parker, MS, RD, LDN Pager # [phone] After hours/ weekend pager # [phone]

## 2016-09-29 ENCOUNTER — Encounter (HOSPITAL_COMMUNITY): Payer: Self-pay

## 2016-09-29 ENCOUNTER — Encounter (HOSPITAL_COMMUNITY)
Admission: EM | Disposition: A | Discharge status: Home Health | Payer: Self-pay | Source: Home / Self Care | Attending: Internal Medicine

## 2016-09-29 HISTORY — PX: GIVENS CAPSULE STUDY: SHX5432

## 2016-09-29 LAB — CBC
HEMATOCRIT: 22.7 % — AB (ref 39.0–52.0)
Hemoglobin: 7.4 g/dL — ABNORMAL LOW (ref 13.0–17.0)
MCH: 28.2 pg (ref 26.0–34.0)
MCHC: 32.6 g/dL (ref 30.0–36.0)
MCV: 86.6 fL (ref 78.0–100.0)
PLATELETS: 216 10*3/uL (ref 150–400)
RBC: 2.62 MIL/uL — AB (ref 4.22–5.81)
RDW: 15.2 % (ref 11.5–15.5)
WBC: 5.5 10*3/uL (ref 4.0–10.5)

## 2016-09-29 LAB — RENAL FUNCTION PANEL
ALBUMIN: 2.9 g/dL — AB (ref 3.5–5.0)
Anion gap: 13 (ref 5–15)
BUN: 54 mg/dL — ABNORMAL HIGH (ref 6–20)
CO2: 25 mmol/L (ref 22–32)
CREATININE: 10.08 mg/dL — AB (ref 0.61–1.24)
Calcium: 8.2 mg/dL — ABNORMAL LOW (ref 8.9–10.3)
Chloride: 93 mmol/L — ABNORMAL LOW (ref 101–111)
GFR calc Af Amer: 5 mL/min — ABNORMAL LOW (ref >60)
GFR calc non Af Amer: 5 mL/min — ABNORMAL LOW (ref >60)
GLUCOSE: 102 mg/dL — AB (ref 65–99)
PHOSPHORUS: 4.5 mg/dL (ref 2.5–4.6)
Potassium: 4 mmol/L (ref 3.5–5.1)
Sodium: 131 mmol/L — ABNORMAL LOW (ref 135–145)

## 2016-09-29 LAB — GLUCOSE, CAPILLARY
Glucose-Capillary: 129 mg/dL — ABNORMAL HIGH (ref 65–99)
Glucose-Capillary: 162 mg/dL — ABNORMAL HIGH (ref 65–99)

## 2016-09-29 LAB — PROTIME-INR
INR: 1.5
Prothrombin Time: 18.3 seconds — ABNORMAL HIGH (ref 11.4–15.2)

## 2016-09-29 SURGERY — IMAGING PROCEDURE, GI TRACT, INTRALUMINAL, VIA CAPSULE

## 2016-09-29 MED ORDER — PANTOPRAZOLE SODIUM 40 MG PO TBEC
40.0000 mg | DELAYED_RELEASE_TABLET | Freq: Two times a day (BID) | ORAL | Status: DC
  Administered 2016-09-30 – 2016-10-02 (×5): 40 mg via ORAL
  Filled 2016-09-29 (×5): qty 1

## 2016-09-29 MED ORDER — INSULIN ASPART 100 UNIT/ML ~~LOC~~ SOLN
0.0000 [IU] | Freq: Three times a day (TID) | SUBCUTANEOUS | Status: DC
  Administered 2016-09-29: 2 [IU] via SUBCUTANEOUS
  Administered 2016-09-30: 1 [IU] via SUBCUTANEOUS
  Administered 2016-09-30: 2 [IU] via SUBCUTANEOUS
  Administered 2016-09-30 – 2016-10-02 (×5): 1 [IU] via SUBCUTANEOUS

## 2016-09-29 SURGICAL SUPPLY — 1 items: TOWEL COTTON PACK 4EA (MISCELLANEOUS) ×6 IMPLANT

## 2016-09-29 NOTE — Consult Note (Addendum)
[place] Nurse wound consult note Reason for Consult: Consult requested for buttocks.  Pt has been incontinent of large amt tarry loose stool. He states he had a wound to his buttocks prior to admission. Wound type: Partial thickness abrasion to inner buttocks, .2X.2X.1cm, pink and dry.  Healing abrasion to middle buttocks; 2X.5X.1cm, pink and dry.  Location and shape are NOT consistent with a pressure injury. Dressing procedure/placement/frequency: Previous foam dressing was soiled and trapping stool against skin.  Leave dressings off and apply barrier cream to protect skin and repel moisture.  Discussed plan of care with patient and he verbalized understanding. Please re-consult if further assistance is needed.  Thank-you,  Julien Girt MSN, [place], [place], [place], [place]

## 2016-09-29 NOTE — Progress Notes (Addendum)
Patient ID: DETERRIUS HEINS, male   DOB: 05-17-1951, 66 y.o.   MRN: [id number]    PROGRESS NOTE  KYIER BURRAGE  [id number] DOB: 1951/06/25 DOA: 09/26/2016  PCP: [name], MD   Brief Narrative:  66 y.o. male  with diabetes, hepatitis C, ESRD on HD TTS, left bundle-branch block, atrial fibrillation, chronic anticoagulation (Coumadin) presented to the hospital with black stools that he first noticed several days prior to this admission. His last HD session was 3/3 and pt came right to the ED after HD. GI and nephrology team consulted for assistance.   Assessment & Plan:   Principal Problem:   Acute blood loss anemia, melena, ? Hematemesis  - in the setting of chronic AC - pt had an EGD on 2/21 which showed grade A esophagitis, gastritis / duodenitis, H pylori negative - Colonoscopy 2/21 with one benign polyp. - Hg down from 8.6 --> 8.3 --. 7.7 --> 7.4 this AM - Capsule endoscopy placed this morning, continue with protonix 40mg  PO BID and await capsule study results  - CBC In AM  Active Problems:   Essential hypertension - pt on Hydralazine, Metoprolol, Cardizem at home - we were holding these medications but pt has now been able to take Cardizem only  - SBP now in 110's, resume one at the time as clinically indicated     DM (diabetes mellitus) type I controlled with renal manifestation ([place]) - keep on renal diet  - last A1C on 09/15/2016 was 6.4, no oral meds at home, suspect can be discharge on low dose oral regimen once ready for discharge  - keep on SSI     Pressure injury of skin - WOC consult     Bilateral BKA - stable for now, no edema noted on exam, no TTP   DVT prophylaxis: Coumadin held for now due to high risk bleed  Code Status: Full  Family Communication: Patient and wife at bedside  Disposition Plan: To be determined, pending GI team recommendations   Consultants:   Nephrology   GI  IR for access placement   Procedures:   Right IJ CVC placed for access  by IR 3/4   Antimicrobials:   None   Subjective: No events overnight.   Objective: Vitals:   09/28/16 1300 09/28/16 2100 09/29/16 0616 09/29/16 0759  BP: 100/62 131/78 119/71   Pulse: 74 76 81   Resp: 18  18   Temp: 98.1 F (36.7 C) 98.5 F (36.9 C) 98.8 F (37.1 C)   TempSrc: Oral Oral Oral   SpO2: 100% 100% 100%   Weight:   90.4 kg (199 lb 3.2 oz) 90.3 kg (199 lb)  Height:    5\' 10"  (1.778 m)    Intake/Output Summary (Last 24 hours) at 09/29/16 1419 Last data filed at 09/28/16 2200  Gross per 24 hour  Intake              760 ml  Output                0 ml  Net              760 ml   Filed Weights   09/29/16 0616 09/29/16 0759  Weight: 90.4 kg (199 lb 3.2 oz) 90.3 kg (199 lb)    Examination:  General exam: Appears calm and comfortable  Respiratory system: Respiratory effort normal. Cardiovascular system: S1 & S2 heard, RRR. No JVD, rubs, gallops or clicks.  Gastrointestinal system: Abdomen is nondistended,  soft and nontender. No organomegaly or masses felt. Normal bowel sounds heard. Central nervous system: Alert and oriented. No focal neurological deficits. Extremities: B BKA  Data Reviewed: I have personally reviewed following labs and imaging studies  CBC:  Recent Labs Lab 09/26/16 1119  09/26/16 1815 09/26/16 2227 09/27/16 0312 09/28/16 0605 09/29/16 0405  WBC 8.5  --   --   --  6.4 5.5 5.5  NEUTROABS 5.7  --   --   --   --   --   --   HGB 9.8*  < > 8.6* 8.6* 8.3* 7.7* 7.4*  HCT 30.9*  < > 27.9* 27.2* 26.1* 23.5* 22.7*  MCV 89.6  --   --   --  88.8 87.4 86.6  PLT 198  --   --   --  186 212 216  < > = values in this interval not displayed. Basic Metabolic Panel:  Recent Labs Lab 09/26/16 1119 09/27/16 0312 09/28/16 0605 09/29/16 0405  NA 134* 134* 132* 131*  K 4.0 3.9 4.0 4.0  CL 97* 96* 98* 93*  CO2 27 28 26 25   GLUCOSE 106* 113* 109* 102*  BUN 31* 37* 50* 54*  CREATININE 5.18* 6.60* 8.86* 10.08*  CALCIUM 9.2 8.6* 8.1* 8.2*  PHOS   --   --  4.7* 4.5   Liver Function Tests:  Recent Labs Lab 09/26/16 1119 09/28/16 0605 09/29/16 0405  AST 14*  --   --   ALT 12*  --   --   ALKPHOS 37*  --   --   BILITOT 0.5  --   --   PROT 7.5  --   --   ALBUMIN 3.5 3.0* 2.9*    Recent Labs Lab 09/26/16 1119  LIPASE 29   Coagulation Profile:  Recent Labs Lab 09/23/16 1534 09/26/16 1151 09/27/16 0312 09/29/16 0405  INR 3.3 3.37 3.69 1.50   CBG:  Recent Labs Lab 09/28/16 0615 09/28/16 1150 09/28/16 1636 09/28/16 2053 09/29/16 1136  GLUCAP 104* 185* 151* 117* 129*   Urine analysis:    Component Value Date/Time   COLORURINE YELLOW 05/15/2013 0007   APPEARANCEUR CLEAR 05/15/2013 0007   LABSPEC 1.014 05/15/2013 0007   PHURINE 8.5 (H) 05/15/2013 0007   GLUCOSEU 250 (A) 05/15/2013 0007   GLUCOSEU 500 (?) 11/06/2009 0948   HGBUR NEGATIVE 05/15/2013 0007   BILIRUBINUR NEGATIVE 05/15/2013 0007   KETONESUR NEGATIVE 05/15/2013 0007   PROTEINUR >300 (A) 05/15/2013 0007   UROBILINOGEN 0.2 05/15/2013 0007   NITRITE NEGATIVE 05/15/2013 0007   LEUKOCYTESUR NEGATIVE 05/15/2013 0007   Radiology Studies: No results found.  Scheduled Meds: . atorvastatin  40 mg Oral q1800  . calcium acetate  2,001 mg Oral TID WC  . cinacalcet  30 mg Oral QPM  . darbepoetin (ARANESP) injection - DIALYSIS  60 mcg Intravenous Q Tue-HD  . diltiazem  120 mg Oral Daily  . feeding supplement  1 Container Oral BID BM  . latanoprost  1 drop Both Eyes QHS  . pantoprazole  40 mg Oral BID  . QUEtiapine  25 mg Oral BID  . sodium chloride  250 mL Intravenous Once   Continuous Infusions:   LOS: 3 days   Time spent: 20 minutes   Faye Ramsay, MD [facility] Pager [phone]  If 7PM-7AM, please contact night-coverage [email or web address] Password [email or web address] 09/29/2016, 2:19 PM

## 2016-09-29 NOTE — Progress Notes (Signed)
[facility] Progress Note   Subjective: no c/o's, Hb down 7.4, had capsule endo process started this am  Vitals:   09/28/16 1300 09/28/16 2100 09/29/16 0616 09/29/16 0759  BP: 100/62 131/78 119/71   Pulse: 74 76 81   Resp: 18  18   Temp: 98.1 F (36.7 C) 98.5 F (36.9 C) 98.8 F (37.1 C)   TempSrc: Oral Oral Oral   SpO2: 100% 100% 100%   Weight:   90.4 kg (199 lb 3.2 oz) 90.3 kg (199 lb)  Height:    5\' 10"  (1.778 m)    Inpatient medications: . atorvastatin  40 mg Oral q1800  . calcium acetate  2,001 mg Oral TID WC  . cinacalcet  30 mg Oral QPM  . darbepoetin (ARANESP) injection - DIALYSIS  60 mcg Intravenous Q Tue-HD  . diltiazem  120 mg Oral Daily  . feeding supplement  1 Container Oral BID BM  . latanoprost  1 drop Both Eyes QHS  . pantoprazole  40 mg Oral BID  . QUEtiapine  25 mg Oral BID  . sodium chloride  250 mL Intravenous Once    acetaminophen **OR** acetaminophen, cyclobenzaprine, hydrOXYzine, ondansetron **OR** ondansetron (ZOFRAN) IV, sodium chloride flush, traMADol  Exam: Alert, no distress, facial edema No jvd Chest clear bilat RRR no mrg ABd obese, soft ntnd Ext no edema Bilat BKA  LUA AVF +bruit NF, ox3   Dialysis: [place] TTS 3h 55min  84kg  2/2.25 bath  Hep 3400  LUA AVF  - no meds      Assessment: 1. GI bleed - hx same in past, per GI/ primary sp capsule endo placed today 2. ESRD HD tts 3. HTN - holding home meds (dilt/ metop/ hydralazine) due to low BP's 4. Volume excess - up 6kg if weights are right 5. Anemia of CKD/ GIB - to start esa today w HD, Hb down 7.4, baseline was 11 6. MBD cont binder 7. DM2 8. Hx CVA 9. Hx bilat BKA - uses WC at home, lives w his wife 82. PAF - on po diltiazem  Plan - HD today, max UF as tolerated, esa w HD tuesday   Kelly Splinter MD [place] pager [phone]   09/29/2016, 11:19 AM    Recent Labs Lab 09/27/16 0312 09/28/16 0605 09/29/16 0405  NA 134* 132* 131*   K 3.9 4.0 4.0  CL 96* 98* 93*  CO2 28 26 25   GLUCOSE 113* 109* 102*  BUN 37* 50* 54*  CREATININE 6.60* 8.86* 10.08*  CALCIUM 8.6* 8.1* 8.2*  PHOS  --  4.7* 4.5    Recent Labs Lab 09/26/16 1119 09/28/16 0605 09/29/16 0405  AST 14*  --   --   ALT 12*  --   --   ALKPHOS 37*  --   --   BILITOT 0.5  --   --   PROT 7.5  --   --   ALBUMIN 3.5 3.0* 2.9*    Recent Labs Lab 09/26/16 1119  09/27/16 0312 09/28/16 0605 09/29/16 0405  WBC 8.5  --  6.4 5.5 5.5  NEUTROABS 5.7  --   --   --   --   HGB 9.8*  < > 8.3* 7.7* 7.4*  HCT 30.9*  < > 26.1* 23.5* 22.7*  MCV 89.6  --  88.8 87.4 86.6  PLT 198  --  186 212 216  < > = values in this interval not displayed. Iron/TIBC/Ferritin/ %Sat    Component  Value Date/Time   IRON 21 (L) 09/15/2012 1400   TIBC 182 (L) 09/15/2012 1400   FERRITIN 306 09/15/2012 1400   IRONPCTSAT 12 (L) 09/15/2012 1400

## 2016-09-29 NOTE — Progress Notes (Signed)
Progress Note   Subjective  Patient drank half bowel prep last night, refused the other half. Nursing reported dark stools with prep. He had capsule placed this morning.    Objective   Vital signs in last 24 hours: Temp:  [98.1 F (36.7 C)-98.8 F (37.1 C)] 98.8 F (37.1 C) (03/06 0616) Pulse Rate:  [74-81] 81 (03/06 0616) Resp:  [18] 18 (03/06 0616) BP: (100-131)/(62-78) 119/71 (03/06 0616) SpO2:  [100 %] 100 % (03/06 0616) Weight:  [199 lb (90.3 kg)-199 lb 3.2 oz (90.4 kg)] 199 lb (90.3 kg) (03/06 0759) Last BM Date: 09/28/16 General:    AA male in NAD, sleeping Heart:  Regular rate and rhythm Lungs: Respirations even and unlabored Abdomen:  Soft, nontender and nondistended.  Extremities:  bil AKA Neurologic:  sleeping Psych:  sleeping  Intake/Output from previous day: 03/05 0701 - 03/06 0700 In: 1000 [P.O.:1000] Out: -  Intake/Output this shift: No intake/output data recorded.  Lab Results:  Recent Labs  09/27/16 0312 09/28/16 0605 09/29/16 0405  WBC 6.4 5.5 5.5  HGB 8.3* 7.7* 7.4*  HCT 26.1* 23.5* 22.7*  PLT 186 212 216   BMET  Recent Labs  09/27/16 0312 09/28/16 0605 09/29/16 0405  NA 134* 132* 131*  K 3.9 4.0 4.0  CL 96* 98* 93*  CO2 28 26 25   GLUCOSE 113* 109* 102*  BUN 37* 50* 54*  CREATININE 6.60* 8.86* 10.08*  CALCIUM 8.6* 8.1* 8.2*   LFT  Recent Labs  09/26/16 1119  09/29/16 0405  PROT 7.5  --   --   ALBUMIN 3.5  < > 2.9*  AST 14*  --   --   ALT 12*  --   --   ALKPHOS 37*  --   --   BILITOT 0.5  --   --   < > = values in this interval not displayed. PT/INR  Recent Labs  09/27/16 0312 09/29/16 0405  LABPROT 37.5* 18.3*  INR 3.69 1.50    Studies/Results: Ir Fluoro Guide Cv Line Right  Result Date: 09/27/2016 CLINICAL DATA:  Poor intravenous access and need for central line. EXAM: NON-TUNNELED CENTRAL VENOUS CATHETER PLACEMENT WITH ULTRASOUND AND FLUOROSCOPIC GUIDANCE FLUOROSCOPY TIME:  18 seconds.  1.0 mGy.  PROCEDURE: The procedure, risks, benefits, and alternatives were explained to the patient's wife. Questions regarding the procedure were encouraged and answered. The patient's wife understands and consents to the procedure. The right neck was prepped with chlorhexidine in a sterile fashion, and a sterile drape was applied covering the operative field. Maximum barrier sterile technique with sterile gowns and gloves were used for the procedure. Local anesthesia was provided with 1% lidocaine. After creating a small venotomy incision, a 21 gauge needle was advanced into the right internal jugular vein under direct, real-time ultrasound guidance. Ultrasound image documentation was performed. After securing guidewire access, a peel-away sheath was placed over a guide wire. Utilizing guide wire measurement, a power injectable 5 [place], dual-lumen non tunneled catheter was cut to 15 cm. The catheter was then placed through the sheath and the sheath removed. Final catheter positioning was confirmed and documented with a fluoroscopic spot image. The catheter was aspirated, flushed with saline, and injected with appropriate volume heparin dwells. The catheter exit site was secured with 0-Prolene retention sutures. COMPLICATIONS: None.  No pneumothorax. FINDINGS: After catheter placement, the tip lies at the cavoatrial junction. The catheter aspirates normally and is ready for immediate use. IMPRESSION: Placement of non-tunneled central venous  catheter via the right internal jugular vein. The catheter tip lies at the cavoatrial junction. The catheter is ready for immediate use. Electronically Signed   By: Aletta Edouard M.D.   On: 09/27/2016 14:25   Ir US Guide Vasc Access Right  Result Date: 09/27/2016 CLINICAL DATA:  Poor intravenous access and need for central line. EXAM: NON-TUNNELED CENTRAL VENOUS CATHETER PLACEMENT WITH ULTRASOUND AND FLUOROSCOPIC GUIDANCE FLUOROSCOPY TIME:  18 seconds.  1.0 mGy. PROCEDURE: The  procedure, risks, benefits, and alternatives were explained to the patient's wife. Questions regarding the procedure were encouraged and answered. The patient's wife understands and consents to the procedure. The right neck was prepped with chlorhexidine in a sterile fashion, and a sterile drape was applied covering the operative field. Maximum barrier sterile technique with sterile gowns and gloves were used for the procedure. Local anesthesia was provided with 1% lidocaine. After creating a small venotomy incision, a 21 gauge needle was advanced into the right internal jugular vein under direct, real-time ultrasound guidance. Ultrasound image documentation was performed. After securing guidewire access, a peel-away sheath was placed over a guide wire. Utilizing guide wire measurement, a power injectable 5 [place], dual-lumen non tunneled catheter was cut to 15 cm. The catheter was then placed through the sheath and the sheath removed. Final catheter positioning was confirmed and documented with a fluoroscopic spot image. The catheter was aspirated, flushed with saline, and injected with appropriate volume heparin dwells. The catheter exit site was secured with 0-Prolene retention sutures. COMPLICATIONS: None.  No pneumothorax. FINDINGS: After catheter placement, the tip lies at the cavoatrial junction. The catheter aspirates normally and is ready for immediate use. IMPRESSION: Placement of non-tunneled central venous catheter via the right internal jugular vein. The catheter tip lies at the cavoatrial junction. The catheter is ready for immediate use. Electronically Signed   By: Aletta Edouard M.D.   On: 09/27/2016 14:25       Assessment / Plan:   66 y/o male with multiple medical problems, history of GI bleeding leading to extensive workup in 2014 with suspected small bowel source at the time, presenting again with dark stools and worsening anemia. EGD on 2/21 showed grade A esophagitis, gastritis /  duodenitis, H pylori negative. Colonoscopy 2/21 with one benign polyp. He's had recurrent bleeding since that time, suspect possible small bowel source, he's been on high dose PPI since his last exam. Capsule endoscopy placed this morning. Will await result of capsule, will be back tomorrow. Otherwise monitor for recurrent symptoms and trend H/H. Nursing requesting switch to oral protonix given access issues and desire to limit IV infusions, will switch to protonix 40mg  PO BID and await capsule study. Discussed with family today, patient sleeping during rounds.   McBride Cellar, MD [facility] Pager [phone]

## 2016-09-29 NOTE — Progress Notes (Signed)
Small bowel capsule ingested at 0815.  Pt and wife verbalize understanding.  Instructions given to floor RN

## 2016-09-29 NOTE — Progress Notes (Signed)
Nurse consulted with MD about removal of IJ. MD stated that IJ could be removed. IV team requested that IJ stay intact due to patient is a dialysis patient and a difficult stick, also to be used for labs. Nurse notified MD of this as well.

## 2016-09-29 NOTE — Progress Notes (Signed)
2130-spoke to GI doc about pt refusing the other half of bowel prep. Ok per Md, NPO at midnight for capsule endoscopy. Aware of HD tomorrow.

## 2016-09-30 DIAGNOSIS — L8990 Pressure ulcer of unspecified site, unspecified stage: Secondary | ICD-10-CM

## 2016-09-30 DIAGNOSIS — I480 Paroxysmal atrial fibrillation: Secondary | ICD-10-CM

## 2016-09-30 DIAGNOSIS — N184 Chronic kidney disease, stage 4 (severe): Secondary | ICD-10-CM

## 2016-09-30 DIAGNOSIS — E1022 Type 1 diabetes mellitus with diabetic chronic kidney disease: Secondary | ICD-10-CM

## 2016-09-30 DIAGNOSIS — D649 Anemia, unspecified: Secondary | ICD-10-CM

## 2016-09-30 DIAGNOSIS — I10 Essential (primary) hypertension: Secondary | ICD-10-CM

## 2016-09-30 LAB — GLUCOSE, CAPILLARY
GLUCOSE-CAPILLARY: 138 mg/dL — AB (ref 65–99)
Glucose-Capillary: 138 mg/dL — ABNORMAL HIGH (ref 65–99)
Glucose-Capillary: 105 mg/dL — ABNORMAL HIGH (ref 65–99)

## 2016-09-30 MED ORDER — LIDOCAINE-PRILOCAINE 2.5-2.5 % EX CREA: 1.0000 "application " | TOPICAL_CREAM | CUTANEOUS | Status: DC | PRN

## 2016-09-30 MED ORDER — DARBEPOETIN ALFA 60 MCG/0.3ML IJ SOSY
PREFILLED_SYRINGE | INTRAMUSCULAR | Status: AC
  Administered 2016-09-30: 60 ug via INTRAVENOUS
  Filled 2016-09-30: qty 0.3

## 2016-09-30 MED ORDER — SODIUM CHLORIDE 0.9 % IV SOLN: 100.0000 mL | INTRAVENOUS | Status: DC | PRN

## 2016-09-30 MED ORDER — QUETIAPINE FUMARATE 25 MG PO TABS
25.0000 mg | ORAL_TABLET | Freq: Every day | ORAL | Status: DC
  Administered 2016-10-01: 25 mg via ORAL
  Filled 2016-09-30 (×2): qty 1

## 2016-09-30 MED ORDER — DARBEPOETIN ALFA 60 MCG/0.3ML IJ SOSY
60.0000 ug | PREFILLED_SYRINGE | INTRAMUSCULAR | Status: DC
  Filled 2016-09-30: qty 0.3

## 2016-09-30 MED ORDER — HEPARIN SODIUM (PORCINE) 1000 UNIT/ML DIALYSIS: 1000.0000 [IU] | INTRAMUSCULAR | Status: DC | PRN

## 2016-09-30 MED ORDER — QUETIAPINE 12.5 MG HALF TABLET
12.5000 mg | ORAL_TABLET | Freq: Every day | ORAL | Status: DC
  Administered 2016-09-30 – 2016-10-02 (×2): 12.5 mg via ORAL
  Filled 2016-09-30 (×3): qty 1

## 2016-09-30 MED ORDER — ALTEPLASE 2 MG IJ SOLR: 2.0000 mg | Freq: Once | INTRAMUSCULAR | Status: DC | PRN

## 2016-09-30 MED ORDER — PENTAFLUOROPROP-TETRAFLUOROETH EX AERO: 1.0000 "application " | INHALATION_SPRAY | CUTANEOUS | Status: DC | PRN

## 2016-09-30 MED ORDER — LIDOCAINE HCL (PF) 1 % IJ SOLN: 5.0000 mL | INTRAMUSCULAR | Status: DC | PRN

## 2016-09-30 NOTE — Progress Notes (Signed)
PROGRESS NOTE    Oscar Castillo  ZHG:[id number] DOB: 06-08-1951 DOA: 09/26/2016 PCP: [name], MD   Chief Complaint  Patient presents with  . GI Bleeding    Brief Narrative:  66 y.o.malewith diabetes, hepatitis C, ESRD on HD TTS, left bundle-branch block, atrial fibrillation, chronic anticoagulation (Coumadin) presented to the hospital with black stools that he first noticed several days prior to this admission. His last HD session was 3/3 and pt came right to the ED after HD. GI and nephrology team consulted for assistance.  Assessment & Plan  Acute blood loss anemia, melena, ? Hematemesis  -in the setting of chronic AC -pt had an EGD on 2/21 which showed grade A esophagitis, gastritis / duodenitis, H pylori negative -Colonoscopy 2/21 with one benign polyp. -Hg down from 8.6 --> 8.3 --. 7.7 --> 7.4  -Pending results of Capsule endoscopy  -continue with protonix 40mg  PO BID and await capsule study results  -Monitor CBC  Essential hypertension -pt on Hydralazine, Metoprolol, Cardizem at home -BP currently soft, 96/60 -Currently on cardizem  Diabetes mellitus type I controlled with renal manifestation ([place]) -keep on renal diet  -last A1C on 09/15/2016 was 6.4, no oral meds at home, suspect can be discharge on low dose oral regimen once ready for discharge  -keep on SSI   Pressure injury of skin- buttocks -WOC consult, abrasion to inner buttocks  -Present on admission per patient  -Continue barrier cream  Bilateral BKA -stable for now, no edema noted on exam, no TTP  ESRD -Nephrology consulted and appreciated -Patient TTS, will dialyze 3/8  Paroxysmal AFib -Currently on PO diltiazem -Coumadin held  -CHADSVASC 5 (age, HTN, DM, h/o CVA)  DVT Prophylaxis  Coumadin held  Code Status: Full  Family Communication: Wife at bedside  Disposition Plan: Admitted, pending results of capsule endoscopy and recommendations from  GI  Consultants Gastroenterology Nephrology Interventional radiology - access placement  Procedures  Capsule endoscopy  R IJ CVC placed   Antibiotics   Anti-infectives    None      Subjective:   Oscar Castillo seen and examined today.  Patient has no complaints today, would like results of capsule endoscopy. Denies chest pain, shortness of breath, abdominal pain.    Objective:   Vitals:   09/30/16 0315 09/30/16 0330 09/30/16 0400 09/30/16 0525  BP: (!) 81/47 (!) 84/47 (!) 94/52 96/60  Pulse: 93 94 89 74  Resp: 14 14 15 15   Temp:    98 F (36.7 C)  TempSrc:    Oral  SpO2:    96%  Weight:      Height:        Intake/Output Summary (Last 24 hours) at 09/30/16 1332 Last data filed at 09/30/16 0900  Gross per 24 hour  Intake              740 ml  Output             2500 ml  Net            -1760 ml   Filed Weights   09/29/16 0616 09/29/16 0759 09/30/16 0030  Weight: 90.4 kg (199 lb 3.2 oz) 90.3 kg (199 lb) 90.1 kg (198 lb 10.2 oz)    Exam  General: Well developed, well nourished, NAD, appears stated age  66: NCAT,mucous membranes moist.   Neck: Supple, Right IJ in place  Cardiovascular: S1 S2 auscultated, no rubs, murmurs or gallops. Regular rate and rhythm.  Respiratory: Clear to auscultation bilaterally  with equal chest rise  Abdomen: Soft, nontender, nondistended, + bowel sounds  Extremities: B/L BKA  Neuro: AAOx3, nonfocal  Psych: Normal affect and demeanor    Data Reviewed: I have personally reviewed following labs and imaging studies  CBC:  Recent Labs Lab 09/26/16 1119  09/26/16 1815 09/26/16 2227 09/27/16 0312 09/28/16 0605 09/29/16 0405  WBC 8.5  --   --   --  6.4 5.5 5.5  NEUTROABS 5.7  --   --   --   --   --   --   HGB 9.8*  < > 8.6* 8.6* 8.3* 7.7* 7.4*  HCT 30.9*  < > 27.9* 27.2* 26.1* 23.5* 22.7*  MCV 89.6  --   --   --  88.8 87.4 86.6  PLT 198  --   --   --  186 212 216  < > = values in this interval not displayed. Basic  Metabolic Panel:  Recent Labs Lab 09/26/16 1119 09/27/16 0312 09/28/16 0605 09/29/16 0405  NA 134* 134* 132* 131*  K 4.0 3.9 4.0 4.0  CL 97* 96* 98* 93*  CO2 27 28 26 25   GLUCOSE 106* 113* 109* 102*  BUN 31* 37* 50* 54*  CREATININE 5.18* 6.60* 8.86* 10.08*  CALCIUM 9.2 8.6* 8.1* 8.2*  PHOS  --   --  4.7* 4.5   GFR: Estimated Creatinine Clearance: 8.2 mL/min (by C-G formula based on SCr of 10.08 mg/dL (H)). Liver Function Tests:  Recent Labs Lab 09/26/16 1119 09/28/16 0605 09/29/16 0405  AST 14*  --   --   ALT 12*  --   --   ALKPHOS 37*  --   --   BILITOT 0.5  --   --   PROT 7.5  --   --   ALBUMIN 3.5 3.0* 2.9*    Recent Labs Lab 09/26/16 1119  LIPASE 29   No results for input(s): AMMONIA in the last 168 hours. Coagulation Profile:  Recent Labs Lab 09/23/16 1534 09/26/16 1151 09/27/16 0312 09/29/16 0405  INR 3.3 3.37 3.69 1.50   Cardiac Enzymes: No results for input(s): CKTOTAL, CKMB, CKMBINDEX, TROPONINI in the last 168 hours. BNP (last 3 results) No results for input(s): PROBNP in the last 8760 hours. HbA1C: No results for input(s): HGBA1C in the last 72 hours. CBG:  Recent Labs Lab 09/28/16 1636 09/28/16 2053 09/29/16 1136 09/29/16 1639 09/30/16 1132  GLUCAP 151* 117* 129* 162* 138*   Lipid Profile: No results for input(s): CHOL, HDL, LDLCALC, TRIG, CHOLHDL, LDLDIRECT in the last 72 hours. Thyroid Function Tests: No results for input(s): TSH, T4TOTAL, FREET4, T3FREE, THYROIDAB in the last 72 hours. Anemia Panel: No results for input(s): VITAMINB12, FOLATE, FERRITIN, TIBC, IRON, RETICCTPCT in the last 72 hours. Urine analysis:    Component Value Date/Time   COLORURINE YELLOW 05/15/2013 0007   APPEARANCEUR CLEAR 05/15/2013 0007   LABSPEC 1.014 05/15/2013 0007   PHURINE 8.5 (H) 05/15/2013 0007   GLUCOSEU 250 (A) 05/15/2013 0007   GLUCOSEU 500 (?) 11/06/2009 0948   HGBUR NEGATIVE 05/15/2013 0007   BILIRUBINUR NEGATIVE 05/15/2013 0007    KETONESUR NEGATIVE 05/15/2013 0007   PROTEINUR >300 (A) 05/15/2013 0007   UROBILINOGEN 0.2 05/15/2013 0007   NITRITE NEGATIVE 05/15/2013 0007   LEUKOCYTESUR NEGATIVE 05/15/2013 0007   Sepsis Labs: @LABRCNTIP (procalcitonin:4,lacticidven:4)  )No results found for this or any previous visit (from the past 240 hour(s)).    Radiology Studies: No results found.   Scheduled Meds: . atorvastatin  40 mg  Oral q1800  . calcium acetate  2,001 mg Oral TID WC  . cinacalcet  30 mg Oral QPM  . [START ON 10/01/2016] darbepoetin (ARANESP) injection - DIALYSIS  60 mcg Intravenous Q Thu-HD  . diltiazem  120 mg Oral Daily  . feeding supplement  1 Container Oral BID BM  . insulin aspart  0-9 Units Subcutaneous TID WC  . latanoprost  1 drop Both Eyes QHS  . pantoprazole  40 mg Oral BID  . QUEtiapine  25 mg Oral BID  . sodium chloride  250 mL Intravenous Once   Continuous Infusions:   LOS: 4 days   Time Spent in minutes   30 minutes  Nia Nathaniel D.O. on 09/30/2016 at 1:32 PM  Between 7am to 7pm - Pager - [phone]  After 7pm go to [email or web address] - password [email or web address]  And look for the night coverage person covering for me after hours  [facility] Office  [phone]

## 2016-09-30 NOTE — Care Management Important Message (Signed)
Important Message  Patient Details  Name: Oscar Castillo MRN: [id number] Date of Birth: November 24, 1950   Medicare Important Message Given:  Yes    Oscar Castillo 09/30/2016, 1:15 PM

## 2016-09-30 NOTE — Progress Notes (Signed)
      Progress Note   Subjective  Patient reports tolerating diet. No abdominal pains or vomiting. Hgb has continued to slightly drift down. Capsule study resulted this afternoon as below   Objective   Vital signs in last 24 hours: Temp:  [98 F (36.7 C)-98.9 F (37.2 C)] 98.9 F (37.2 C) (03/07 1439) Pulse Rate:  [74-94] 93 (03/07 1439) Resp:  [12-20] 17 (03/07 1439) BP: (81-151)/(47-92) 96/61 (03/07 1439) SpO2:  [96 %-100 %] 100 % (03/07 1439) Weight:  [198 lb 10.2 oz (90.1 kg)] 198 lb 10.2 oz (90.1 kg) (03/07 0030) Last BM Date: 09/29/16 General:    AA male in NAD Heart:  Regular rate and rhythm;  Lungs: Respirations even and unlabored,  Abdomen:  Soft, nontender, nondistended. . Extremities:  B AKA Neurologic:  Alert and oriented,  grossly normal neurologically. Psych:  Cooperative. Normal mood and affect.  Intake/Output from previous day: 03/06 0701 - 03/07 0700 In: 730 [P.O.:730] Out: 2500  Intake/Output this shift: Total I/O In: 480 [P.O.:480] Out: -   Lab Results:  Recent Labs  09/28/16 0605 09/29/16 0405  WBC 5.5 5.5  HGB 7.7* 7.4*  HCT 23.5* 22.7*  PLT 212 216   BMET  Recent Labs  09/28/16 0605 09/29/16 0405  NA 132* 131*  K 4.0 4.0  CL 98* 93*  CO2 26 25  GLUCOSE 109* 102*  BUN 50* 54*  CREATININE 8.86* 10.08*  CALCIUM 8.1* 8.2*   LFT  Recent Labs  09/29/16 0405  ALBUMIN 2.9*   PT/INR  Recent Labs  09/29/16 0405  LABPROT 18.3*  INR 1.50    Studies/Results: No results found.     Assessment / Plan:   66 y/o male with multiple medical problems, ESRD on HD, history of GI bleeding leading to extensive workup in 2014 with suspected small bowel source at the time, presenting again with dark stools and worsening anemia.   EGD on 2/21 showed grade A esophagitis, gastritis / duodenitis, H pylori negative. Colonoscopy 2/21 with one benign polyp. He's had symptoms of recurrent bleeding since that time and has been on high dose PPI  since that time. Capsule endoscopy performed overnight. Mild gastritis of the stomach noted, otherwise while bowel prep was only fair, no pathology noted in the small bowel to cause anemia or his symptoms. No mass lesions or stigmata of bleeding. Brown stool noted throughout the small bowel, and no blood in the stomach.   Anemia is likely multifactorial from prior bleeding and CKD, previously did have esophagitis / gastritis which could have caused some of this. Continue high dose protonix at this time, continue diet, trend Hgb. I would check iron studies and if deficient give him IV iron. If he has obvious overt bleeding we can consider repeat EGD or enteroscopy but think we can hold off at this time unless he becomes symptomatic again. Patient agreed with the plan and we will reassess him in the morning.   [place] Cellar, MD [facility] Gastroenter[facility] Pager [phone]

## 2016-09-30 NOTE — Evaluation (Signed)
Physical Therapy Evaluation Patient Details Name: Oscar Castillo MRN: [id number] DOB: [birth year] Today's Date: 09/30/2016   History of Present Illness  66 y.o. male admitted to [facility] on 09/26/16 for dark stools and weakness. Dx with acute blood loss anemia, melena, and pressure injury of the skin/buttocks.  Pt with significant PMHx of bil BKA (2014/2015), PAF, ESRD (HD T, TH, Sat), DM 2, essential HTN, LBBB, hepatitis C, CHF, CVA, chronic back pain, and anemia.    Clinical Impression  Pt is limited by loose stool and buttocks (anal hole) pain.  He had a large stool in the bed and had to roll multiple times to get clean.  He reports feeling really weak and did not want to attempt EOB with his anal pain right now.  He was agreeable for PT to come back tomorrow, even if it is after HD.  I am hopeful for him to d/c back home with his wife's assist at [facility] level.   PT to follow acutely for deficits listed below.       Follow Up Recommendations Home health PT;Supervision for mobility/OOB    Equipment Recommendations  None recommended by PT    Recommendations for Other Services   NA    Precautions / Restrictions Precautions Precautions: Fall Restrictions Weight Bearing Restrictions: No      Mobility  Bed Mobility Overal bed mobility: Needs Assistance Bed Mobility: Rolling Rolling: Mod assist         General bed mobility comments: Rolled bil multiple times for peri care due to bowel incontinence and bed change.  total assist to scoot to [facility] with bed in trendelenberg.  Pt positioned in right side lying to take presure off of his buttocks.   Transfers                 General transfer comment: Pt agreeable to attempt EOB even if it is after HD tomorrow.  He is too painful on his buttocks to attempt today.                Pertinent Vitals/Pain Pain Assessment: Faces Faces Pain Scale: Hurts whole lot Pain Location: bottom after loose stool Pain Descriptors / Indicators:  Burning Pain Intervention(s): Limited activity within patient's tolerance;Monitored during session;Repositioned    Home Living Family/patient expects to be discharged to:: Private residence Living Arrangements: Spouse/significant other Available Help at Discharge: Family;Available 24 hours/day Type of Home: House       Home Layout: One level Home Equipment: [place] - 2 wheels;Wheelchair - [employer];Tub bench;Hospital bed Additional Comments: bil prostheses in closet in room.     Prior Function Level of Independence: Needs assistance   Gait / Transfers Assistance Needed: primarily uses WC.  Per pt report he puts on his bil prosthesis and scoots to WC.  He squats to 3-in-1 using the handles on the [facility] [place] [facility] and he can stand with RW, but does not currently walk  ADL's / Homemaking Assistance Needed: per pt report he usually sponges off        Hand Dominance   Dominant Hand: Right    Extremity/Trunk Assessment   Upper Extremity Assessment Upper Extremity Assessment: Generalized weakness    Lower Extremity Assessment Lower Extremity Assessment: Generalized weakness (bil BKA, pt reports feeling weak overall)    Cervical / Trunk Assessment Cervical / Trunk Assessment: Other exceptions Cervical / Trunk Exceptions: h/o chronic low back pain  Communication   Communication: No difficulties  Cognition Arousal/Alertness: Awake/alert Behavior During Therapy: WFL for tasks assessed/performed  Overall Cognitive Status: Within Functional Limits for tasks assessed                      General Comments General comments (skin integrity, edema, etc.): Pt was very tender with peri care        Assessment/Plan    PT Assessment Patient needs continued PT services  PT Problem List Decreased strength;Decreased activity tolerance;Decreased balance;Decreased mobility;Pain;Decreased skin integrity       PT Treatment Interventions DME instruction;Functional mobility  training;Therapeutic activities;Balance training;Therapeutic exercise;Patient/family education;Wheelchair mobility training    PT Goals (Current goals can be found in the Care Plan section)  Acute Rehab PT Goals Patient Stated Goal: to feel better, stronger, less pain PT Goal Formulation: With patient Time For Goal Achievement: 10/14/16 Potential to Achieve Goals: Good    Frequency Min 3X/week           End of Session   Activity Tolerance: Patient limited by pain;Patient limited by fatigue Patient left: in bed;with call bell/phone within reach   PT Visit Diagnosis: Muscle weakness (generalized) (M62.81);Difficulty in walking, not elsewhere classified (R26.2);Pain Pain - Right/Left: Right Pain - part of body:  (buttocks)         Time: 5726-2035 PT Time Calculation (min) (ACUTE ONLY): 28 min   Charges:      1 EV mod, 1 TA         Tokiko Diefenderfer B. Noble, [place], DPT [phone]   09/30/2016, 5:38 PM

## 2016-09-30 NOTE — Progress Notes (Signed)
[facility] Progress Note   Subjective: no c/o's, results of capsule endo not back yet. BP's remain low.    Vitals:   09/30/16 0315 09/30/16 0330 09/30/16 0400 09/30/16 0525  BP: (!) 81/47 (!) 84/47 (!) 94/52 96/60  Pulse: 93 94 89 74  Resp: 14 14 15 15   Temp:    98 F (36.7 C)  TempSrc:    Oral  SpO2:    96%  Weight:      Height:        Inpatient medications: . atorvastatin  40 mg Oral q1800  . calcium acetate  2,001 mg Oral TID WC  . cinacalcet  30 mg Oral QPM  . [START ON 10/01/2016] darbepoetin (ARANESP) injection - DIALYSIS  60 mcg Intravenous Q Thu-HD  . diltiazem  120 mg Oral Daily  . feeding supplement  1 Container Oral BID BM  . insulin aspart  0-9 Units Subcutaneous TID WC  . latanoprost  1 drop Both Eyes QHS  . pantoprazole  40 mg Oral BID  . QUEtiapine  25 mg Oral BID  . sodium chloride  250 mL Intravenous Once    sodium chloride, sodium chloride, acetaminophen **OR** acetaminophen, alteplase, cyclobenzaprine, heparin, hydrOXYzine, lidocaine (PF), lidocaine-prilocaine, ondansetron **OR** ondansetron (ZOFRAN) IV, pentafluoroprop-tetrafluoroeth, sodium chloride flush, traMADol  Exam: Alert, no distress, facial edema No jvd Chest clear bilat RRR no mrg ABd obese, soft ntnd Ext no edema Bilat BKA  LUA AVF +bruit NF, ox3   Dialysis: [place] TTS 3h 68min  84kg  2/2.25 bath  Hep 3400  LUA AVF  - no meds      Assessment: 1. GI bleed - sp capsule endo placed today 2. ESRD HD tts 3. HTN - holding home meds (dilt/ metop/ hydralazine) due to low BP's 4. Volume - not sure wt's are accurate, will ask for hoyer wt 5. Anemia of CKD/ GIB - started ESA tues 3/6, Hb down 7.4, baseline was 11 6. MBD cont binder 7. DM2 8. Hx CVA 9. Hx bilat BKA - uses WC at home, lives w his wife 48. PAF - on po diltiazem  Plan - HD tomorrow, hoyer wt   Kelly Splinter MD [place] pager [phone]   09/30/2016, 12:40 PM    Recent Labs Lab  09/27/16 0312 09/28/16 0605 09/29/16 0405  NA 134* 132* 131*  K 3.9 4.0 4.0  CL 96* 98* 93*  CO2 28 26 25   GLUCOSE 113* 109* 102*  BUN 37* 50* 54*  CREATININE 6.60* 8.86* 10.08*  CALCIUM 8.6* 8.1* 8.2*  PHOS  --  4.7* 4.5    Recent Labs Lab 09/26/16 1119 09/28/16 0605 09/29/16 0405  AST 14*  --   --   ALT 12*  --   --   ALKPHOS 37*  --   --   BILITOT 0.5  --   --   PROT 7.5  --   --   ALBUMIN 3.5 3.0* 2.9*    Recent Labs Lab 09/26/16 1119  09/27/16 0312 09/28/16 0605 09/29/16 0405  WBC 8.5  --  6.4 5.5 5.5  NEUTROABS 5.7  --   --   --   --   HGB 9.8*  < > 8.3* 7.7* 7.4*  HCT 30.9*  < > 26.1* 23.5* 22.7*  MCV 89.6  --  88.8 87.4 86.6  PLT 198  --  186 212 216  < > = values in this interval not displayed. Iron/TIBC/Ferritin/ %Sat    Component Value  Date/Time   IRON 21 (L) 09/15/2012 1400   TIBC 182 (L) 09/15/2012 1400   FERRITIN 306 09/15/2012 1400   IRONPCTSAT 12 (L) 09/15/2012 1400

## 2016-10-01 DIAGNOSIS — N186 End stage renal disease: Secondary | ICD-10-CM

## 2016-10-01 DIAGNOSIS — I959 Hypotension, unspecified: Secondary | ICD-10-CM

## 2016-10-01 DIAGNOSIS — Z992 Dependence on renal dialysis: Secondary | ICD-10-CM

## 2016-10-01 LAB — CBC
HCT: 23 % — ABNORMAL LOW (ref 39.0–52.0)
Hemoglobin: 7.4 g/dL — ABNORMAL LOW (ref 13.0–17.0)
MCH: 28.2 pg (ref 26.0–34.0)
MCHC: 32.2 g/dL (ref 30.0–36.0)
MCV: 87.8 fL (ref 78.0–100.0)
Platelets: 234 10*3/uL (ref 150–400)
RBC: 2.62 MIL/uL — ABNORMAL LOW (ref 4.22–5.81)
RDW: 15.8 % — AB (ref 11.5–15.5)
WBC: 5.6 10*3/uL (ref 4.0–10.5)

## 2016-10-01 LAB — BASIC METABOLIC PANEL
ANION GAP: 10 (ref 5–15)
BUN: 29 mg/dL — ABNORMAL HIGH (ref 6–20)
CALCIUM: 8.3 mg/dL — AB (ref 8.9–10.3)
CHLORIDE: 94 mmol/L — AB (ref 101–111)
CO2: 29 mmol/L (ref 22–32)
CREATININE: 8.1 mg/dL — AB (ref 0.61–1.24)
GFR calc non Af Amer: 6 mL/min — ABNORMAL LOW (ref >60)
GFR, EST AFRICAN AMERICAN: 7 mL/min — AB (ref >60)
Glucose, Bld: 128 mg/dL — ABNORMAL HIGH (ref 65–99)
Potassium: 3.4 mmol/L — ABNORMAL LOW (ref 3.5–5.1)
SODIUM: 133 mmol/L — AB (ref 135–145)

## 2016-10-01 LAB — GLUCOSE, CAPILLARY
GLUCOSE-CAPILLARY: 133 mg/dL — AB (ref 65–99)
Glucose-Capillary: 131 mg/dL — ABNORMAL HIGH (ref 65–99)
Glucose-Capillary: 211 mg/dL — ABNORMAL HIGH (ref 65–99)

## 2016-10-01 LAB — IRON AND TIBC
IRON: 50 ug/dL (ref 45–182)
Saturation Ratios: 30 % (ref 17.9–39.5)
TIBC: 167 ug/dL — AB (ref 250–450)
UIBC: 117 ug/dL

## 2016-10-01 LAB — FERRITIN: FERRITIN: 351 ng/mL — AB (ref 24–336)

## 2016-10-01 LAB — PREPARE RBC (CROSSMATCH)

## 2016-10-01 MED ORDER — SODIUM CHLORIDE 0.9 % IV SOLN: Freq: Once | INTRAVENOUS | Status: DC

## 2016-10-01 MED ORDER — DARBEPOETIN ALFA 60 MCG/0.3ML IJ SOSY
PREFILLED_SYRINGE | INTRAMUSCULAR | Status: AC
  Filled 2016-10-01: qty 0.3

## 2016-10-01 MED ORDER — ASPIRIN EC 81 MG PO TBEC
81.0000 mg | DELAYED_RELEASE_TABLET | Freq: Every day | ORAL | Status: DC
  Filled 2016-10-01: qty 1

## 2016-10-01 MED ORDER — DARBEPOETIN ALFA 60 MCG/0.3ML IJ SOSY
60.0000 ug | PREFILLED_SYRINGE | INTRAMUSCULAR | Status: DC
  Administered 2016-10-01: 60 ug via INTRAVENOUS

## 2016-10-01 NOTE — Progress Notes (Signed)
PROGRESS NOTE    Oscar Castillo  WRU:[id number] DOB: Jun 11, 1951 DOA: 09/26/2016 PCP: [name], MD   Chief Complaint  Patient presents with  . GI Bleeding    Brief Narrative:  66 y.o.malewith diabetes, hepatitis C, ESRD on HD TTS, left bundle-branch block, atrial fibrillation, chronic anticoagulation (Coumadin) presented to the hospital with black stools that he first noticed several days prior to this admission. His last HD session was 3/3 and pt came right to the ED after HD. GI and nephrology team consulted for assistance.  Assessment & Plan  Acute blood loss anemia, melena, ? Hematemesis  -in the setting of chronic AC -pt had an EGD on 2/21 which showed grade A esophagitis, gastritis / duodenitis, H pylori negative -Colonoscopy 2/21 with one benign polyp. -Hg down from 8.6 --> 8.3 --. 7.7 --> 7.4  -FOBT + -Capsule endoscopy showed mild gastritis otherwise no obvious pathology in small bowel to cause symptoms -GI recommended continuing high dose PPI -continue with protonix 40mg  PO BID and await capsule study results  -Monitor CBC -Spoke with Dr. Jonnie Finner, will give patient 2uPRBCs during HD -Iron 50, Ferritin 351  Essential hypertension/hypotension -pt on Hydralazine, Metoprolol, Cardizem at home -BP currently low/soft -Currently on cardizem -Transfusion should hopefully aid this   Diabetes mellitus type I controlled with renal manifestation ([place]) -keep on renal diet  -last A1C on 09/15/2016 was 6.4, no oral meds at home, suspect can be discharge on low dose oral regimen once ready for discharge  -keep on SSI   Pressure injury of skin- buttocks -WOC consult, abrasion to inner buttocks  -Present on admission per patient  -Continue barrier cream  Bilateral BKA -stable for now, no edema noted on exam, no TTP  ESRD -Nephrology consulted and appreciated -Patient TTS, will dialyze 3/8  Paroxysmal AFib -Currently on PO diltiazem -Coumadin held  -CHADSVASC at least  5 (age, HTN, DM, h/o CVA) -Discussed restarting coumadin with the patient and wife at bedside. Explained the risks and benefits of coumadin. Explained to patient that he is at risk for blood clots, stroke, death. He understands, and would like to continue with aspirin and would like for me to discuss with his cardiologist. -Spoke with Dr. Harrington Challenger, she also agreed that patient needed to be on coumadin given his high risk. She will follow up with him in clinic. -Per request of patient, will start him on aspirin  DVT Prophylaxis  Coumadin held  Code Status: Full  Family Communication: Wife at bedside  Disposition Plan: Admitted, transfuse 2uPRBCs. Poss d/c 3/9 if BP stable.  Consultants Gastroenterology Nephrology Interventional radiology - access placement  Procedures  Capsule endoscopy  R IJ CVC placed   Antibiotics   Anti-infectives    None      Subjective:   Oscar Castillo seen and examined today.  Patient has no complaints today, would like to go home soon.  Does not wish to be on coumadin.  Denies chest pain, shortness of breath, abdominal pain.    Objective:   Vitals:   10/01/16 1230 10/01/16 1300 10/01/16 1330 10/01/16 1400  BP: (!) 87/65 (!) 82/51 (!) 84/63 (!) 76/44  Pulse: 92 88 87 89  Resp:      Temp:      TempSrc:      SpO2:      Weight:      Height:        Intake/Output Summary (Last 24 hours) at 10/01/16 1406 Last data filed at 10/01/16 0830  Gross  per 24 hour  Intake              500 ml  Output                0 ml  Net              500 ml   Filed Weights   09/30/16 1700 10/01/16 0849 10/01/16 0953  Weight: 88.3 kg (194 lb 11.2 oz) 89.4 kg (197 lb) 87.5 kg (192 lb 14.4 oz)    Exam  General: Well developed, well nourished, NAD, appears stated age  39: NCAT,mucous membranes moist.   Neck: Supple, Right IJ in place  Cardiovascular: S1 S2 auscultated,RRR, no murmurs  Respiratory: Clear to auscultation bilaterally with equal chest  rise  Abdomen: Soft, nontender, nondistended, + bowel sounds  Extremities: B/L BKA  Neuro: AAOx3, nonfocal  Psych: Normal affect and demeanor    Data Reviewed: I have personally reviewed following labs and imaging studies  CBC:  Recent Labs Lab 09/26/16 1119  09/26/16 2227 09/27/16 0312 09/28/16 0605 09/29/16 0405 10/01/16 0419  WBC 8.5  --   --  6.4 5.5 5.5 5.6  NEUTROABS 5.7  --   --   --   --   --   --   HGB 9.8*  < > 8.6* 8.3* 7.7* 7.4* 7.4*  HCT 30.9*  < > 27.2* 26.1* 23.5* 22.7* 23.0*  MCV 89.6  --   --  88.8 87.4 86.6 87.8  PLT 198  --   --  186 212 216 234  < > = values in this interval not displayed. Basic Metabolic Panel:  Recent Labs Lab 09/26/16 1119 09/27/16 0312 09/28/16 0605 09/29/16 0405 10/01/16 0419  NA 134* 134* 132* 131* 133*  K 4.0 3.9 4.0 4.0 3.4*  CL 97* 96* 98* 93* 94*  CO2 27 28 26 25 29   GLUCOSE 106* 113* 109* 102* 128*  BUN 31* 37* 50* 54* 29*  CREATININE 5.18* 6.60* 8.86* 10.08* 8.10*  CALCIUM 9.2 8.6* 8.1* 8.2* 8.3*  PHOS  --   --  4.7* 4.5  --    GFR: Estimated Creatinine Clearance: 9.4 mL/min (by C-G formula based on SCr of 8.1 mg/dL (H)). Liver Function Tests:  Recent Labs Lab 09/26/16 1119 09/28/16 0605 09/29/16 0405  AST 14*  --   --   ALT 12*  --   --   ALKPHOS 37*  --   --   BILITOT 0.5  --   --   PROT 7.5  --   --   ALBUMIN 3.5 3.0* 2.9*    Recent Labs Lab 09/26/16 1119  LIPASE 29   No results for input(s): AMMONIA in the last 168 hours. Coagulation Profile:  Recent Labs Lab 09/26/16 1151 09/27/16 0312 09/29/16 0405  INR 3.37 3.69 1.50   Cardiac Enzymes: No results for input(s): CKTOTAL, CKMB, CKMBINDEX, TROPONINI in the last 168 hours. BNP (last 3 results) No results for input(s): PROBNP in the last 8760 hours. HbA1C: No results for input(s): HGBA1C in the last 72 hours. CBG:  Recent Labs Lab 09/29/16 1639 09/30/16 1132 09/30/16 1658 09/30/16 2155 10/01/16 0647  GLUCAP 162* 138* 138*  105* 131*   Lipid Profile: No results for input(s): CHOL, HDL, LDLCALC, TRIG, CHOLHDL, LDLDIRECT in the last 72 hours. Thyroid Function Tests: No results for input(s): TSH, T4TOTAL, FREET4, T3FREE, THYROIDAB in the last 72 hours. Anemia Panel:  Recent Labs  10/01/16 0419  FERRITIN 351*  TIBC 167*  IRON 50   Urine analysis:    Component Value Date/Time   COLORURINE YELLOW 05/15/2013 0007   APPEARANCEUR CLEAR 05/15/2013 0007   LABSPEC 1.014 05/15/2013 0007   PHURINE 8.5 (H) 05/15/2013 0007   GLUCOSEU 250 (A) 05/15/2013 0007   GLUCOSEU 500 (?) 11/06/2009 0948   HGBUR NEGATIVE 05/15/2013 0007   BILIRUBINUR NEGATIVE 05/15/2013 0007   KETONESUR NEGATIVE 05/15/2013 0007   PROTEINUR >300 (A) 05/15/2013 0007   UROBILINOGEN 0.2 05/15/2013 0007   NITRITE NEGATIVE 05/15/2013 0007   LEUKOCYTESUR NEGATIVE 05/15/2013 0007   Sepsis Labs: @LABRCNTIP (procalcitonin:4,lacticidven:4)  )No results found for this or any previous visit (from the past 240 hour(s)).    Radiology Studies: No results found.   Scheduled Meds: . aspirin EC  81 mg Oral Daily  . atorvastatin  40 mg Oral q1800  . calcium acetate  2,001 mg Oral TID WC  . cinacalcet  30 mg Oral QPM  . Darbepoetin Alfa      . [START ON 10/08/2016] darbepoetin (ARANESP) injection - DIALYSIS  60 mcg Intravenous Q Thu-HD  . diltiazem  120 mg Oral Daily  . feeding supplement  1 Container Oral BID BM  . insulin aspart  0-9 Units Subcutaneous TID WC  . latanoprost  1 drop Both Eyes QHS  . pantoprazole  40 mg Oral BID  . QUEtiapine  12.5 mg Oral Daily  . QUEtiapine  25 mg Oral QHS  . sodium chloride  250 mL Intravenous Once   Continuous Infusions:   LOS: 5 days   Time Spent in minutes   30 minutes  Josia Cueva D.O. on 10/01/2016 at 2:06 PM  Between 7am to 7pm - Pager - [phone]  After 7pm go to [email or web address] - password [email or web address]  And look for the night coverage person covering for me after hours  [facility] Office  [phone]

## 2016-10-01 NOTE — Progress Notes (Signed)
PT Cancellation Note  Patient Details Name: Oscar Castillo MRN: [id number] DOB: October 17, 1950   Cancelled Treatment:    Reason Eval/Treat Not Completed: Patient at procedure or test/unavailable PT will check on pt later as time allows.    [place] April, PTA Pager: [phone]   10/01/2016, 10:37 AM

## 2016-10-01 NOTE — Progress Notes (Signed)
MD notified that patient declined blood products this date.

## 2016-10-01 NOTE — Progress Notes (Signed)
      Progress Note   Subjective  Patient had no symptoms of bleeding per nursing staff and wife overnight. Tolerating a diet. No pain. Sleeping during rounds this AM, spoke with wife   Objective   Vital signs in last 24 hours: Temp:  [98.9 F (37.2 C)-99.7 F (37.6 C)] 98.9 F (37.2 C) (03/08 0542) Pulse Rate:  [82-110] 82 (03/08 0542) Resp:  [17] 17 (03/07 1941) BP: (89-103)/(51-63) 103/63 (03/08 0542) SpO2:  [97 %-100 %] 97 % (03/08 0542) Weight:  [194 lb 11.2 oz (88.3 kg)] 194 lb 11.2 oz (88.3 kg) (03/07 1700) Last BM Date: 09/29/16 General:    AA male in NAD, sleeping Heart:  Regular rate and rhythm Lungs: Respirations even and unlabored Abdomen:  Soft, nontender and nondistended.  Extremities:  B AKA Neurologic:  sleeping  Intake/Output from previous day: 03/07 0701 - 03/08 0700 In: 500 [P.O.:480; I.V.:20] Out: -  Intake/Output this shift: No intake/output data recorded.  Lab Results:  Recent Labs  09/29/16 0405 10/01/16 0419  WBC 5.5 5.6  HGB 7.4* 7.4*  HCT 22.7* 23.0*  PLT 216 234   BMET  Recent Labs  09/29/16 0405 10/01/16 0419  NA 131* 133*  K 4.0 3.4*  CL 93* 94*  CO2 25 29  GLUCOSE 102* 128*  BUN 54* 29*  CREATININE 10.08* 8.10*  CALCIUM 8.2* 8.3*   LFT  Recent Labs  09/29/16 0405  ALBUMIN 2.9*   PT/INR  Recent Labs  09/29/16 0405  LABPROT 18.3*  INR 1.50    Studies/Results: No results found.     Assessment / Plan:   66 y/o male with multiple medical problems, ESRD on HD, A fibb on coumadin, history of GI bleeding leading to extensive workup in 2014 with suspected small bowel source at the time, presented for admission in late February for melena, had esophagitis / gastritis / duodenitis on EGD and colonoscopy with one benign polyp, and erythematous area in the rectum thought to be from impacted stool.   Readmitted with recurrence of symptoms. On PPI. Capsule endoscopy performed which showed mild gastritis, otherwise no  obvious pathology in the small bowel to cause symptoms - no blood noted in the stomach and in the entire small bowel, however fair prep noted with limited views in some areas, small lesions could have been missed.   He has a baseline anemia likely due to CKD, with interval worsening due to bleeding history and recurrent blood draws. No evidence of bleeding the past few days, Hgb is stable. Iron studies normal.   I would continue high dose PPI at this time. He is at risk for recurrent bleeding on anticoagulation however he does have a history of CVA and warrants treatment for a fibb. Defer to primary service regarding resumption of coumadin versus aspirin or other anticoagulant (consider trial of heparin for 24 hours and monitor for bleeding) . If he has recurrent bleeding we can perform an EGD / enteroscopy, but think yield will be low given his recent EGD 2 weeks ago and recent capsule study findings.   Please call with questions.  Amoret Cellar, MD [facility] Gastroenterology Pager [phone]

## 2016-10-01 NOTE — Procedures (Signed)
Hb 7.5.  Capsule endo showed mild gastritis only.  No intervention planned per GI notes.  HD today, getting fluid off in spite of low BP's.     I was present at this dialysis session, have reviewed the session itself and made  appropriate changes Kelly Splinter MD [place] pager [phone]   10/01/2016, 1:28 PM

## 2016-10-01 NOTE — Procedures (Deleted)
Doing well with UF so far.  No new issues.  Hb 7.5.    I was present at this dialysis session, have reviewed the session itself and made  appropriate changes Kelly Splinter MD [place] pager [phone]   10/01/2016, 1:26 PM

## 2016-10-02 LAB — CBC
HCT: 32.1 % — ABNORMAL LOW (ref 39.0–52.0)
HEMOGLOBIN: 10.4 g/dL — AB (ref 13.0–17.0)
MCH: 28.4 pg (ref 26.0–34.0)
MCHC: 32.4 g/dL (ref 30.0–36.0)
MCV: 87.7 fL (ref 78.0–100.0)
PLATELETS: 209 10*3/uL (ref 150–400)
RBC: 3.66 MIL/uL — ABNORMAL LOW (ref 4.22–5.81)
RDW: 15.8 % — AB (ref 11.5–15.5)
WBC: 7 10*3/uL (ref 4.0–10.5)

## 2016-10-02 LAB — BASIC METABOLIC PANEL
Anion gap: 10 (ref 5–15)
BUN: 19 mg/dL (ref 6–20)
CALCIUM: 8.3 mg/dL — AB (ref 8.9–10.3)
CHLORIDE: 96 mmol/L — AB (ref 101–111)
CO2: 28 mmol/L (ref 22–32)
CREATININE: 6.41 mg/dL — AB (ref 0.61–1.24)
GFR, EST AFRICAN AMERICAN: 9 mL/min — AB (ref >60)
GFR, EST NON AFRICAN AMERICAN: 8 mL/min — AB (ref >60)
Glucose, Bld: 131 mg/dL — ABNORMAL HIGH (ref 65–99)
Potassium: 3.8 mmol/L (ref 3.5–5.1)
SODIUM: 134 mmol/L — AB (ref 135–145)

## 2016-10-02 LAB — GLUCOSE, CAPILLARY
Glucose-Capillary: 134 mg/dL — ABNORMAL HIGH (ref 65–99)
Glucose-Capillary: 147 mg/dL — ABNORMAL HIGH (ref 65–99)

## 2016-10-02 MED ORDER — BOOST / RESOURCE BREEZE PO LIQD: 1.0000 | Freq: Two times a day (BID) | ORAL | 0 refills | Status: DC

## 2016-10-02 MED ORDER — WARFARIN SODIUM 2.5 MG PO TABS: ORAL_TABLET | ORAL | 0 refills | Status: DC

## 2016-10-02 MED ORDER — INSULIN ASPART 100 UNIT/ML ~~LOC~~ SOLN: 0.0000 [IU] | Freq: Three times a day (TID) | SUBCUTANEOUS | 11 refills | Status: DC

## 2016-10-02 NOTE — Progress Notes (Signed)
Discharge instructions printed and reviewed with patient and spouse, and copy given for them to take home. All questions addressed at this time. New prescriptions reviewed with patient, and instructed him what pharmacy they were sent to. IV central line removed by IV team, no drainage/bleeding/swelling from site at time of discharge. Room searched for patient belongings, and confirmed with patient that all valuables were accounted for. Staff and spouse assisted patient to dress, then staff escorted patient to discharge via wheelchair.

## 2016-10-02 NOTE — Progress Notes (Signed)
Physical Therapy Treatment Patient Details Name: Oscar Castillo MRN: [id number] DOB: April 01, 1951 Today's Date: 10/02/2016    History of Present Illness 66 y.o. male admitted to [facility] on 09/26/16 for dark stools and weakness. Dx with acute blood loss anemia, melena, and pressure injury of the skin/buttocks.  Pt with significant PMHx of bil BKA (2014/2015), PAF, ESRD (HD T, TH, Sat), DM 2, essential HTN, LBBB, hepatitis C, CHF, CVA, chronic back pain, and anemia.      PT Comments    Pt continues to feel weak and tired today. Pt required +2 assist to transfer to recliner with bilat prostheses donned. Wife present throughout session. Continue to progress as tolerated with anticipated d/c home with HHPT.   Follow Up Recommendations  Home health PT;Supervision for mobility/OOB     Equipment Recommendations  None recommended by PT    Recommendations for Other Services       Precautions / Restrictions Precautions Precautions: Fall Restrictions Weight Bearing Restrictions: No    Mobility  Bed Mobility Overal bed mobility: Needs Assistance Bed Mobility: Supine to Sit     Supine to sit: Mod assist     General bed mobility comments: assist required to elevate trunk into sitting and to scoot hips toward EOB with use of bed pad  Transfers Overall transfer level: Needs assistance   Transfers: Squat Pivot Transfers     Squat pivot transfers: Max assist;+2 safety/equipment     General transfer comment: cues for hand placment and technique; bed pad used to assist with lifting buttocks over arm of chair; wife present and reported she assists with transfers at home; total A to donn bilat prostheses which wife reported she also assists with at home  Ambulation/Gait                 Stairs            Wheelchair Mobility    Modified Rankin (Stroke Patients Only)       Balance Overall balance assessment: Needs assistance Sitting-balance support: Bilateral upper extremity  supported Sitting balance-Leahy Scale: Poor Sitting balance - Comments: LOB posteriorly X1 and required assistance to recover                            Cognition Arousal/Alertness: Awake/alert Behavior During Therapy: Flat affect Overall Cognitive Status: Within Functional Limits for tasks assessed                 General Comments: pt agitated about asked to mobilize and needed max encouragement    Exercises      General Comments General comments (skin integrity, edema, etc.): wife present throughout session      Pertinent Vitals/Pain Pain Assessment: Faces Faces Pain Scale: No hurt Pain Intervention(s): Monitored during session    Home Living                      Prior Function            PT Goals (current goals can now be found in the care plan section) Acute Rehab PT Goals Patient Stated Goal: none stated PT Goal Formulation: With patient Time For Goal Achievement: 10/14/16 Potential to Achieve Goals: Good Progress towards PT goals: Progressing toward goals    Frequency    Min 3X/week      PT Plan Current plan remains appropriate    Co-evaluation  End of Session Equipment Utilized During Treatment: Gait belt Activity Tolerance: Patient limited by fatigue Patient left: with call bell/phone within reach;in chair;with family/visitor present Nurse Communication: Mobility status PT Visit Diagnosis: Muscle weakness (generalized) (M62.81);Difficulty in walking, not elsewhere classified (R26.2);Pain Pain - Right/Left: Right Pain - part of body:  (buttocks)     Time: 2841-3244 PT Time Calculation (min) (ACUTE ONLY): 25 min  Charges:  $Therapeutic Activity: 23-37 mins                    G Codes:       [place] April, PTA Pager: [phone]   10/02/2016, 11:34 AM

## 2016-10-02 NOTE — Progress Notes (Signed)
Daily Rounding Note  10/02/2016, 9:20 AM  LOS: 6 days   SUBJECTIVE:   Chief complaint: Hematemesis.  No recurrence.    Stool this AM was brown.    Transfused 2 PRBCs early this AM.  Hgb 7.4 early yest AM and 10.4 this AM.   Wife asking if pt will resume all previous BP meds in light of recent hypotension.   OBJECTIVE:         Vital signs in last 24 hours:    Temp:  [98.1 F (36.7 C)-99.2 F (37.3 C)] 98.3 F (36.8 C) (03/09 0600) Pulse Rate:  [70-92] 87 (03/09 0600) Resp:  [15-18] 16 (03/09 0600) BP: (75-129)/(44-75) 115/70 (03/09 0600) SpO2:  [96 %-100 %] 100 % (03/09 0530) Weight:  [84 kg (185 lb 3 oz)-87.5 kg (192 lb 14.4 oz)] 84 kg (185 lb 3 oz) (03/08 1649) Last BM Date: 10/02/16 Filed Weights   10/01/16 0953 10/01/16 1411 10/01/16 1649  Weight: 87.5 kg (192 lb 14.4 oz) 85.3 kg (188 lb 0.8 oz) 84 kg (185 lb 3 oz)   General: slept during exam.  Looks chronically ill   Heart: RRR Chest: non-labored breathing Abdomen: soft, NT.  BS active.  Extremities: s/p bil BKA Neuro/Psych:  Slept undistubede during exam.  Did not attempt to wake him up.    Intake/Output from previous day: 03/08 0701 - 03/09 0700 In: 580 [P.O.:480; I.V.:100] Out: 2300   Intake/Output this shift: No intake/output data recorded.  Lab Results:  Recent Labs  10/01/16 0419 10/02/16 0751  WBC 5.6 7.0  HGB 7.4* 10.4*  HCT 23.0* 32.1*  PLT 234 209   BMET  Recent Labs  10/01/16 0419 10/02/16 0751  NA 133* 134*  K 3.4* 3.8  CL 94* 96*  CO2 29 28  GLUCOSE 128* 131*  BUN 29* 19  CREATININE 8.10* 6.41*  CALCIUM 8.3* 8.3*   LFT No results for input(s): PROT, ALBUMIN, AST, ALT, ALKPHOS, BILITOT, BILIDIR, IBILI in the last 72 hours. PT/INR No results for input(s): LABPROT, INR in the last 72 hours. Hepatitis Panel No results for input(s): HEPBSAG, HCVAB, HEPAIGM, HEPBIGM in the last 72 hours.  Studies/Results: No results  found.   Scheduled Meds: . sodium chloride   Intravenous Once  . aspirin EC  81 mg Oral Daily  . atorvastatin  40 mg Oral q1800  . calcium acetate  2,001 mg Oral TID WC  . cinacalcet  30 mg Oral QPM  . [START ON 10/08/2016] darbepoetin (ARANESP) injection - DIALYSIS  60 mcg Intravenous Q Thu-HD  . diltiazem  120 mg Oral Daily  . feeding supplement  1 Container Oral BID BM  . insulin aspart  0-9 Units Subcutaneous TID WC  . latanoprost  1 drop Both Eyes QHS  . pantoprazole  40 mg Oral BID  . QUEtiapine  12.5 mg Oral Daily  . QUEtiapine  25 mg Oral QHS  . sodium chloride  250 mL Intravenous Once   Continuous Infusions: PRN Meds:.acetaminophen **OR** acetaminophen, cyclobenzaprine, hydrOXYzine, ondansetron **OR** ondansetron (ZOFRAN) IV, sodium chloride flush, traMADol   ASSESMENT:   *  Hematemesis.  Black stools.  Multifactorial anemia.  S/p PRBC x 2 on 3/9.  On Aranesp.   09/16/16 EGD: Grade A esophagitis, erosive gastritis and duodenitis. Path: chronic, inactive, mild gastritis, no H Pylori.   BID IV Protonix in place.  09/16/16 Colonoscopy with 4 mm descending polypectomy (benign polypoid mucosa), "Hard, very dark stool found  and removed ondigital rectal exam."   09/30/16 Capsule endo: mild  Gastritis.   In 10/2012 pt had colonoscopy x 2, EGD x 1, tagged scan x 1 for elusive GIB.  Capsule endo 10/2012: ? prox SB ulcer vs submucosal leiomyoma.  CT enterography unrevealing. Enteroscopy 11/08/12: Normal study to 4 feet beyond Ampulla of Vater into Jejunum.  *  A fib. Chronic Coumadin.  CHADS VASC >= 5.  Dr Harrington Challenger says he is high risk for thrombotic/embolic events. Plan is to restart [facility] this afternoon and D/C home today.     *  ESRD.  HD dependent.  TTS schedule.   *  S/p bil BKA.    *  DM.     *  History Hep C.  Completed elbasvir/grazoprevir 11/2015.  Non detectable virus 11/2015 and 02/2016.  Normal liver on 06/2016 ultrasound.    PLAN   *  ? When to restart [facility]?  Could AC be switched  to Xarelto, Eliquis or Pradaxa?   *  Follow up with Dr Hilarie Fredrickson, seen in 03/2015 by him in office.   Has appt with Darrell Jewel PA-C for GI on 3/19 at 0945.      Azucena Freed  10/02/2016, 9:20 AM Pager: [phone]

## 2016-10-02 NOTE — Progress Notes (Signed)
[facility] Progress Note   Subjective: Hb up to 10 after prbc x2 yest   Vitals:   10/02/16 0255 10/02/16 0345 10/02/16 0530 10/02/16 0600  BP: (!) 106/58 (!) 100/59 109/65 115/70  Pulse: 88 90 87 87  Resp: 15 16 15 16   Temp: 99.2 F (37.3 C) 98.6 F (37 C) 98.1 F (36.7 C) 98.3 F (36.8 C)  TempSrc: Oral Oral Oral Oral  SpO2:  100% 100%   Weight:      Height:        Inpatient medications: . sodium chloride   Intravenous Once  . aspirin EC  81 mg Oral Daily  . atorvastatin  40 mg Oral q1800  . calcium acetate  2,001 mg Oral TID WC  . cinacalcet  30 mg Oral QPM  . [START ON 10/08/2016] darbepoetin (ARANESP) injection - DIALYSIS  60 mcg Intravenous Q Thu-HD  . diltiazem  120 mg Oral Daily  . feeding supplement  1 Container Oral BID BM  . insulin aspart  0-9 Units Subcutaneous TID WC  . latanoprost  1 drop Both Eyes QHS  . pantoprazole  40 mg Oral BID  . QUEtiapine  12.5 mg Oral Daily  . QUEtiapine  25 mg Oral QHS  . sodium chloride  250 mL Intravenous Once    acetaminophen **OR** acetaminophen, cyclobenzaprine, hydrOXYzine, ondansetron **OR** ondansetron (ZOFRAN) IV, sodium chloride flush, traMADol  Exam: Alert, no distress, facial edema No jvd Chest clear bilat RRR no mrg ABd obese, soft ntnd Ext no edema Bilat BKA  LUA AVF +bruit NF, ox3   Dialysis: [place] [facility]TS 3h 33min  84kg  2/2.25 bath  Hep 3400  LUA AVF  - no meds      Assessment: 1. GI bleed - sp CE showed gastritis, no obvious bleeds. Is for dc today, coumadin being restarted.  2. ESRD HD TTS 3. HTN - holding home meds (dilt/ metop/ hydralazine) due to low BP's. Cont to hold at dc, have d/w pts wife 4. Volume - down to dry wt today 5. Anemia of CKD/ GIB - started ESA tues 3/6, Hb down 7.4, baseline was 11 6. MBD cont binder 7. DM2 8. Hx CVA 9. Hx bilat BKA - uses WC at home, lives w his wife 58. PAF - on po diltiazem  Plan - for dc today   Kelly Splinter MD [facility] pager [phone]   10/02/2016, 12:38 PM    Recent Labs Lab 09/28/16 0605 09/29/16 0405 10/01/16 0419 10/02/16 0751  NA 132* 131* 133* 134*  K 4.0 4.0 3.4* 3.8  CL 98* 93* 94* 96*  CO2 26 25 29 28   GLUCOSE 109* 102* 128* 131*  BUN 50* 54* 29* 19  CREATININE 8.86* 10.08* 8.10* 6.41*  CALCIUM 8.1* 8.2* 8.3* 8.3*  PHOS 4.7* 4.5  --   --     Recent Labs Lab 09/26/16 1119 09/28/16 0605 09/29/16 0405  AST 14*  --   --   ALT 12*  --   --   ALKPHOS 37*  --   --   BILITOT 0.5  --   --   PROT 7.5  --   --   ALBUMIN 3.5 3.0* 2.9*    Recent Labs Lab 09/26/16 1119  09/29/16 0405 10/01/16 0419 10/02/16 0751  WBC 8.5  < > 5.5 5.6 7.0  NEUTROABS 5.7  --   --   --   --   HGB 9.8*  < > 7.4* 7.4* 10.4*  HCT 30.9*  < > 22.7* 23.0* 32.1*  MCV 89.6  < > 86.6 87.8 87.7  PLT 198  < > 216 234 209  < > = values in this interval not displayed. Iron/TIBC/Ferritin/ %Sat    Component Value Date/Time   IRON 50 10/01/2016 0419   TIBC 167 (L) 10/01/2016 0419   FERRITIN 351 (H) 10/01/2016 0419   IRONPCTSAT 30 10/01/2016 0419

## 2016-10-02 NOTE — Discharge Instructions (Signed)
Anemia, Nonspecific Anemia is a condition in which the concentration of red blood cells or hemoglobin in the blood is below normal. Hemoglobin is a substance in red blood cells that carries oxygen to the tissues of the body. Anemia results in not enough oxygen reaching these tissues. What are the causes? Common causes of anemia include:  Excessive bleeding. Bleeding may be internal or external. This includes excessive bleeding from periods (in women) or from the intestine.  Poor nutrition.  Chronic kidney, thyroid, and liver disease.  Bone marrow disorders that decrease red blood cell production.  Cancer and treatments for cancer.  HIV, AIDS, and their treatments.  Spleen problems that increase red blood cell destruction.  Blood disorders.  Excess destruction of red blood cells due to infection, medicines, and autoimmune disorders. What are the signs or symptoms?  Minor weakness.  Dizziness.  Headache.  Palpitations.  Shortness of breath, especially with exercise.  Paleness.  Cold sensitivity.  Indigestion.  Nausea.  Difficulty sleeping.  Difficulty concentrating. Symptoms may occur suddenly or they may develop slowly. How is this diagnosed? Additional blood tests are often needed. These help your health care provider determine the best treatment. Your health care provider will check your stool for blood and look for other causes of blood loss. How is this treated? Treatment varies depending on the cause of the anemia. Treatment can include:  Supplements of iron, vitamin H84, or folic acid.  Hormone medicines.  A blood transfusion. This may be needed if blood loss is severe.  Hospitalization. This may be needed if there is significant continual blood loss.  Dietary changes.  Spleen removal. Follow these instructions at home: Keep all follow-up appointments. It often takes many weeks to correct anemia, and having your health care provider check on your  condition and your response to treatment is very important. Get help right away if:  You develop extreme weakness, shortness of breath, or chest pain.  You become dizzy or have trouble concentrating.  You develop heavy vaginal bleeding.  You develop a rash.  You have bloody or black, tarry stools.  You faint.  You vomit up blood.  You vomit repeatedly.  You have abdominal pain.  You have a fever or persistent symptoms for more than 2-3 days.  You have a fever and your symptoms suddenly get worse.  You are dehydrated. This information is not intended to replace advice given to you by your health care provider. Make sure you discuss any questions you have with your health care provider. Document Released: 08/20/2004 Document Revised: 12/25/2015 Document Reviewed: 01/06/2013 Elsevier Interactive Patient Education  2017 [place] on my medicine - Coumadin   (Warfarin)  This medication education was reviewed with me or my healthcare representative as part of my discharge preparation.    Why was Coumadin prescribed for you? Coumadin was prescribed for you because you have a blood clot or a medical condition that can cause an increased risk of forming blood clots. Blood clots can cause serious health problems by blocking the flow of blood to the heart, lung, or brain. Coumadin can prevent harmful blood clots from forming. As a reminder your indication for Coumadin is:   Blood Clotting Disorder  What test will check on my response to Coumadin? While on Coumadin (warfarin) you will need to have an INR test regularly to ensure that your dose is keeping you in the desired range. The INR (international normalized ratio) number is calculated from the result of  the laboratory test called prothrombin time (PT).  If an INR APPOINTMENT HAS NOT ALREADY BEEN MADE FOR YOU please schedule an appointment to have this lab work done by your health care provider within 7  days. Your INR goal is usually a number between:  2 to 3 or your provider may give you a more narrow range like 2-2.5.  Ask your health care provider during an office visit what your goal INR is.  What  do you need to  know  About  COUMADIN? Take Coumadin (warfarin) exactly as prescribed by your healthcare provider about the same time each day.  DO NOT stop taking without talking to the doctor who prescribed the medication.  Stopping without other blood clot prevention medication to take the place of Coumadin may increase your risk of developing a new clot or stroke.  Get refills before you run out.  What do you do if you miss a dose? If you miss a dose, take it as soon as you remember on the same day then continue your regularly scheduled regimen the next day.  Do not take two doses of Coumadin at the same time.  Important Safety Information A possible side effect of Coumadin (Warfarin) is an increased risk of bleeding. You should call your healthcare provider right away if you experience any of the following: ? Bleeding from an injury or your nose that does not stop. ? Unusual colored urine (red or dark brown) or unusual colored stools (red or black). ? Unusual bruising for unknown reasons. ? A serious fall or if you hit your head (even if there is no bleeding).  Some foods or medicines interact with Coumadin (warfarin) and might alter your response to warfarin. To help avoid this: ? Eat a balanced diet, maintaining a consistent amount of Vitamin K. ? Notify your provider about major diet changes you plan to make. ? Avoid alcohol or limit your intake to 1 drink for women and 2 drinks for men per day. (1 drink is 5 oz. wine, 12 oz. beer, or 1.5 oz. liquor.)  Make sure that ANY health care provider who prescribes medication for you knows that you are taking Coumadin (warfarin).  Also make sure the healthcare provider who is monitoring your Coumadin knows when you have started a new medication  including herbals and non-prescription products.  Coumadin (Warfarin)  Major Drug Interactions  Increased Warfarin Effect Decreased Warfarin Effect  Alcohol (large quantities) Antibiotics (esp. Septra/Bactrim, Flagyl, Cipro) Amiodarone (Cordarone) Aspirin (ASA) Cimetidine (Tagamet) Megestrol (Megace) NSAIDs (ibuprofen, naproxen, etc.) Piroxicam (Feldene) Propafenone (Rythmol SR) Propranolol (Inderal) Isoniazid (INH) Posaconazole (Noxafil) Barbiturates (Phenobarbital) Carbamazepine (Tegretol) Chlordiazepoxide (Librium) Cholestyramine (Questran) Griseofulvin Oral Contraceptives Rifampin Sucralfate (Carafate) Vitamin K   Coumadin (Warfarin) Major Herbal Interactions  Increased Warfarin Effect Decreased Warfarin Effect  Garlic Ginseng Ginkgo biloba Coenzyme Q10 Green tea [place]. Johns wort    Coumadin (Warfarin) FOOD Interactions  Eat a consistent number of servings per week of foods HIGH in Vitamin K (1 serving =  cup)  Collards (cooked, or boiled & drained) Kale (cooked, or boiled & drained) Mustard greens (cooked, or boiled & drained) Parsley *serving size only =  cup Spinach (cooked, or boiled & drained) Swiss chard (cooked, or boiled & drained) Turnip greens (cooked, or boiled & drained)  Eat a consistent number of servings per week of foods MEDIUM-HIGH in Vitamin K (1 serving = 1 cup)  Asparagus (cooked, or boiled & drained) Broccoli (cooked, boiled & drained, or raw & chopped)  Brussel sprouts (cooked, or boiled & drained) *serving size only =  cup Lettuce, raw (green leaf, endive, romaine) Spinach, raw Turnip greens, raw & chopped   These websites have more information on Coumadin (warfarin):  FailFactory.se; VeganReport.com.au;

## 2016-10-02 NOTE — Care Management Note (Signed)
Case Management Note  Patient Details  Name: TRACIE LINDBLOOM MRN: [id number] Date of Birth: December 31, 1950  Subjective/Objective:    66 yr old gentleman admitted with melena.                Action/Plan: Case manager spoke with patient and wife concerning Home Health needs. Choice was offered for [facility] agency. Patient's wife states they have used [facility] in the past and wish to do so now. CM called referral to Stevie Kern, [place] Liaison. Patient will have family support at discharge.     Expected Discharge Date:  10/02/16               Expected Discharge Plan:  [place]  In-House Referral:  NA  Discharge planning Services  CM Consult  Post Acute Care Choice:  Home Health Choice offered to:  Patient  DME Arranged:  N/A DME Agency:     HH Arranged:  PT [place] Agency:  [facility]  Status of Service:  Completed, signed off  If discussed at [place] of Stay Meetings, dates discussed:    Additional Comments:  Ninfa Meeker, RN 10/02/2016, 12:38 PM

## 2016-10-02 NOTE — Discharge Summary (Signed)
Physician Discharge Summary  Oscar Castillo NWG:[id number] DOB: Mar 31, 1951 DOA: 09/26/2016  PCP: [name], MD  Admit date: 09/26/2016 Discharge date: 10/02/2016  Time spent: 45 minutes  Recommendations for Outpatient Follow-up:  Patient will be discharged to home with home health physical therapy.   Patient will need to follow up with primary care provider within one week of discharge, discuss blood pressure and diabetes management. Have INR check on Monday, 10/05/16. Follow up with Dr. Harrington Challenger, cardiology.  Only take 2.5mg  of Coumadin nightly until INR is checked. Follow up with Dr. Havery Moros, gastroenterologist, in 1-2 weeks.  Continue hemodialysis as scheduled.  Patient should continue medications as prescribed.   Patient should follow a renal/carb modified diet with 1223ml fluid restriction per day.   Discharge Diagnoses:  Acute blood loss anemia, melena, ? Hematemesis  Essential hypertension/hypotension Diabetes mellitus type I controlled with renal manifestation (HCC) Pressure injury of skin- buttocks Bilateral BKA ESRD Paroxysmal AFib  Discharge Condition: Stable  Diet recommendation: Renal/carb modified  Filed Weights   10/01/16 0953 10/01/16 1411 10/01/16 1649  Weight: 87.5 kg (192 lb 14.4 oz) 85.3 kg (188 lb 0.8 oz) 84 kg (185 lb 3 oz)    History of present illness:  On 09/26/2016 by Dr. Adrian Saran is a 65 y.o. male  with past medical history significant for dialysis, diabetes, hepatitis C, high blood pressure, left bundle-branch block, atrial fibrillation, chronic anticoagulation presented to the hospital with black stools. History gathered from patient and his wife. 4/6 months patient had a long history of dark black stools. Patient was recently admitted to the hospital and under sedation had colonic disimpaction. During the same admission a gastric ulcer was found. Patient's anticoagulation was temporarily held during this admission and then restarted.  Patient was better upon discharge. This slowly over the last 4 days patient began to develop dark black stools again. Patient had episode of hematemesis while at dialysis today. He did immediately came to emergency room.  Hospital Course:  Acute blood loss anemia, melena, ? Hematemesis  -in the setting of chronic AC -pt had an EGD on 2/21 which showed grade A esophagitis, gastritis / duodenitis, H pylori negative -Colonoscopy 2/21 with one benign polyp. -Hg down from 8.6 -->8.3 --. 7.7 -->7.4  -FOBT + -Capsule endoscopy showed mild gastritis otherwise no obvious pathology in small bowel to cause symptoms -GI recommended continuing high dose PPI -continue with protonix 40mg  PO BID  -Monitor CBC -Spoke with Dr. Jonnie Finner, Transfused 2uPRBCs -hemoglobin today 10.4 -Iron 50, Ferritin 351 -Repeat CBC in one week. Follow up with GI in 1-2 weeks.   Essential hypertension/hypotension -pt on Hydralazine, Metoprolol, Cardizem at home -Transfusion should hopefully aid this  -Hold metoprolol, cardizem, hydralazine as BP has been soft. Discuss restarting medications with PCP or cardiology  Diabetes mellitus type II controlled with renal manifestation ([place]) -keep on renal diet  -last A1C on 09/15/2016 was 6.4, no oral meds at hom -Was on ISS during hospitalizatino -Discussed starting medications with patient, he states he was on "a shot in the past, but it got better and stopped" -I discussed starting him on insulin again at home or a medication by mouth, he stated he would does not want anything and will discuss with his PCP  Pressure injury of skin- buttocks -WOC consult, abrasion to inner buttocks  -Present on admission per patient  -Continue barrier cream  Bilateral BKA -stable for now, no edema noted on exam, no TTP -PT recommended HH  ESRD -  Nephrology consulted and appreciated -Patient TTS, will dialyze 3/8  Paroxysmal AFib -Currently on PO diltiazem -Coumadin held    -CHADSVASC at least 5 (age, HTN, DM, h/o CVA) -Discussed restarting coumadin with the patient and wife at bedside. Explained the risks and benefits of coumadin. Explained to patient that he is at risk for blood clots, stroke, death. He understands, and would like to continue with aspirin and would like for me to discuss with his cardiologist. -Spoke with Dr. Harrington Challenger, she also agreed that patient needed to be on coumadin given his high risk. She will follow up with him in clinic. Dr. Harrington Challenger did not recommended bridging.  -Patient requested to be placed on aspirin on 3/8. -Spoke with patient again today 3/9, he would like to be back on coumadin.  Will restart coumadin, 2.5mg  daily, with INR to be checked on 3/12. Patient will need to follow up with Dr. Harrington Challenger.   Consultants Gastroenterology Nephrology Interventional radiology - access placement  Procedures  Capsule endoscopy  R IJ CVC placed   Discharge Exam: Vitals:   10/02/16 0530 10/02/16 0600  BP: 109/65 115/70  Pulse: 87 87  Resp: 15 16  Temp: 98.1 F (36.7 C) 98.3 F (36.8 C)   Patient has no complaints today, would like to go home soon.  Does not wish to be on coumadin.  Denies chest pain, shortness of breath, abdominal pain.    Exam  General: Well developed, well nourished, NAD, appears stated age  69: NCAT,mucous membranes moist.   Neck: Supple, Right IJ in place   Cardiovascular: S1 S2 auscultated,RRR, no murmurs  Respiratory: Clear to auscultation bilaterally   Abdomen: Soft, nontender, nondistended, + bowel sounds  Extremities: B/L BKA  Neuro: AAOx3, nonfocal  Psych: Flat  Discharge Instructions Discharge Instructions    Discharge instructions    Complete by:  As directed    Patient will be discharged to home with home health physical therapy.   Patient will need to follow up with primary care provider within one week of discharge, discuss blood pressure and diabetes management. Have INR check on Monday,  10/05/16. Follow up with Dr. Harrington Challenger, cardiology.  Only take 2.5mg  of Coumadin nightly until INR is checked. Follow up with Dr. Havery Moros, gastroenterologist, in 1-2 weeks.  Continue hemodialysis as scheduled.  Patient should continue medications as prescribed.   Patient should follow a renal/carb modified diet with 1289ml fluid restriction per day.     Current Discharge Medication List    START taking these medications   Details  feeding supplement (BOOST / RESOURCE BREEZE) LIQD Take 1 Container by mouth 2 (two) times daily between meals. Qty: 60 Container, Refills: 0    insulin aspart (NOVOLOG) 100 UNIT/ML injection Inject 0-9 Units into the skin 3 (three) times daily with meals. Qty: 10 mL, Refills: 11      CONTINUE these medications which have CHANGED   Details  warfarin (COUMADIN) 2.5 MG tablet Take 2.5mg  nightly, check INR on 10/05/2016 Qty: 30 tablet, Refills: 0      CONTINUE these medications which have NOT CHANGED   Details  acetaminophen (TYLENOL) 500 MG tablet Take 500 mg by mouth every 6 (six) hours as needed for mild pain.    atorvastatin (LIPITOR) 40 MG tablet TAKE 1 TABLET(40 MG) BY MOUTH DAILY AT 6 PM Qty: 30 tablet, Refills: 3    calcium acetate (PHOSLO) 667 MG capsule Take 2,001 mg by mouth 3 (three) times daily with meals.    cyclobenzaprine (FLEXERIL) 5  MG tablet Take 1 tablet (5 mg total) by mouth 3 (three) times daily as needed for muscle spasms. Qty: 60 tablet, Refills: 1    HYDROcodone-acetaminophen (NORCO/VICODIN) 5-325 MG tablet Take 1 tablet by mouth every 6 (six) hours as needed for moderate pain.    hydrOXYzine (ATARAX/VISTARIL) 25 MG tablet TAKE 1 TABLET(25 MG) BY MOUTH EVERY 6 HOURS AS NEEDED Qty: 40 tablet, Refills: 0    ketoconazole (NIZORAL) 2 % cream Apply 1 application topically daily as needed for irritation.    latanoprost (XALATAN) 0.005 % ophthalmic solution Place 1 drop into both eyes at bedtime.     lidocaine-prilocaine (EMLA) cream  APPLY A SMALL AMOUNT TO SKIN AT THE ACCESS SITE (AVF) AS DIRECTED BEFORE EACH DIALYSIS SESSION THREE DAYS A WEEK Refills: 6    ondansetron (ZOFRAN ODT) 4 MG disintegrating tablet 4mg  ODT q4 hours prn nausea/vomit Qty: 15 tablet, Refills: 0    pantoprazole (PROTONIX) 40 MG tablet Take 1 tablet (40 mg total) by mouth 2 (two) times daily before a meal. Qty: 60 tablet, Refills: 1    polyethylene glycol (MIRALAX / GLYCOLAX) packet Take 17 g by mouth 2 (two) times daily. Qty: 28 each, Refills: 0    QUEtiapine (SEROQUEL) 25 MG tablet Take 1 tablet (25 mg total) by mouth 2 (two) times daily. Qty: 60 tablet, Refills: 5    SENSIPAR 30 MG tablet Take 30 mg by mouth every evening.     silver sulfADIAZINE (SILVADENE) 1 % cream Apply 1 application topically daily as needed (irratation).    traMADol (ULTRAM) 50 MG tablet Take 1 tablet (50 mg total) by mouth every 8 (eight) hours as needed. Qty: 90 tablet, Refills: 1    triamcinolone cream (KENALOG) 0.1 % Apply 1 application topically as needed (irritation).  Refills: 2    VOLTAREN 1 % GEL APPLY 2 GRAMS TO HANDS BID TO TID AS NEEDED FOR PAIN. Refills: 0      STOP taking these medications     diltiazem (CARDIZEM CD) 120 MG 24 hr capsule      hydrALAZINE (APRESOLINE) 100 MG tablet      metoprolol succinate (TOPROL-XL) 100 MG 24 hr tablet        No Known Allergies Follow-up Information    Levin Erp, PA Follow up on 10/12/2016.   Specialty:  Gastroenterology Why:  9:45 AM visit with PA for GI Dr Hilarie Fredrickson.  Contact information: [address] [place] [address] [phone]        [name], MD. Schedule an appointment as soon as possible for a visit in 1 week(s).   Specialties:  Internal Medicine, Radiology Why:  Hospital follow up Contact information: [place] [address] [phone]        Dorris Carnes, MD. Schedule an appointment as soon as possible for a visit in 1 week(s).     Specialty:  Cardiology Why:  Hospital follow up, INR checked on 3/12 Contact information: [place] [address] [phone]            The results of significant diagnostics from this hospitalization (including imaging, microbiology, ancillary and laboratory) are listed below for reference.    Significant Diagnostic Studies: Ir Fluoro Guide Cv Line Right  Result Date: 09/27/2016 CLINICAL DATA:  Poor intravenous access and need for central line. EXAM: NON-TUNNELED CENTRAL VENOUS CATHETER PLACEMENT WITH ULTRASOUND AND FLUOROSCOPIC GUIDANCE FLUOROSCOPY TIME:  18 seconds.  1.0 mGy.  PROCEDURE: The procedure, risks, benefits, and alternatives were explained to the patient's wife. Questions regarding the procedure were encouraged and answered. The patient's wife understands and consents to the procedure. The right neck was prepped with chlorhexidine in a sterile fashion, and a sterile drape was applied covering the operative field. Maximum barrier sterile technique with sterile gowns and gloves were used for the procedure. Local anesthesia was provided with 1% lidocaine. After creating a small venotomy incision, a 21 gauge needle was advanced into the right internal jugular vein under direct, real-time ultrasound guidance. Ultrasound image documentation was performed. After securing guidewire access, a peel-away sheath was placed over a guide wire. Utilizing guide wire measurement, a power injectable 5 [place], dual-lumen non tunneled catheter was cut to 15 cm. The catheter was then placed through the sheath and the sheath removed. Final catheter positioning was confirmed and documented with a fluoroscopic spot image. The catheter was aspirated, flushed with saline, and injected with appropriate volume heparin dwells. The catheter exit site was secured with 0-Prolene retention sutures. COMPLICATIONS: None.  No pneumothorax. FINDINGS: After catheter placement, the tip lies  at the cavoatrial junction. The catheter aspirates normally and is ready for immediate use. IMPRESSION: Placement of non-tunneled central venous catheter via the right internal jugular vein. The catheter tip lies at the cavoatrial junction. The catheter is ready for immediate use. Electronically Signed   By: Aletta Edouard M.D.   On: 09/27/2016 14:25   Ir US Guide Vasc Access Right  Result Date: 09/27/2016 CLINICAL DATA:  Poor intravenous access and need for central line. EXAM: NON-TUNNELED CENTRAL VENOUS CATHETER PLACEMENT WITH ULTRASOUND AND FLUOROSCOPIC GUIDANCE FLUOROSCOPY TIME:  18 seconds.  1.0 mGy. PROCEDURE: The procedure, risks, benefits, and alternatives were explained to the patient's wife. Questions regarding the procedure were encouraged and answered. The patient's wife understands and consents to the procedure. The right neck was prepped with chlorhexidine in a sterile fashion, and a sterile drape was applied covering the operative field. Maximum barrier sterile technique with sterile gowns and gloves were used for the procedure. Local anesthesia was provided with 1% lidocaine. After creating a small venotomy incision, a 21 gauge needle was advanced into the right internal jugular vein under direct, real-time ultrasound guidance. Ultrasound image documentation was performed. After securing guidewire access, a peel-away sheath was placed over a guide wire. Utilizing guide wire measurement, a power injectable 5 [place], dual-lumen non tunneled catheter was cut to 15 cm. The catheter was then placed through the sheath and the sheath removed. Final catheter positioning was confirmed and documented with a fluoroscopic spot image. The catheter was aspirated, flushed with saline, and injected with appropriate volume heparin dwells. The catheter exit site was secured with 0-Prolene retention sutures. COMPLICATIONS: None.  No pneumothorax. FINDINGS: After catheter placement, the tip lies at the cavoatrial  junction. The catheter aspirates normally and is ready for immediate use. IMPRESSION: Placement of non-tunneled central venous catheter via the right internal jugular vein. The catheter tip lies at the cavoatrial junction. The catheter is ready for immediate use. Electronically Signed   By: Aletta Edouard M.D.   On: 09/27/2016 14:25    Microbiology: No results found for this or any previous visit (from the past 240 hour(s)).   Labs: Basic Metabolic Panel:  Recent Labs Lab 09/27/16 0312 09/28/16 0605 09/29/16 0405 10/01/16 0419 10/02/16 0751  NA 134* 132* 131* 133* 134*  K 3.9 4.0 4.0 3.4* 3.8  CL 96* 98* 93* 94* 96*  CO2 28  26 25 29 28   GLUCOSE 113* 109* 102* 128* 131*  BUN 37* 50* 54* 29* 19  CREATININE 6.60* 8.86* 10.08* 8.10* 6.41*  CALCIUM 8.6* 8.1* 8.2* 8.3* 8.3*  PHOS  --  4.7* 4.5  --   --    Liver Function Tests:  Recent Labs Lab 09/26/16 1119 09/28/16 0605 09/29/16 0405  AST 14*  --   --   ALT 12*  --   --   ALKPHOS 37*  --   --   BILITOT 0.5  --   --   PROT 7.5  --   --   ALBUMIN 3.5 3.0* 2.9*    Recent Labs Lab 09/26/16 1119  LIPASE 29   No results for input(s): AMMONIA in the last 168 hours. CBC:  Recent Labs Lab 09/26/16 1119  09/27/16 0312 09/28/16 0605 09/29/16 0405 10/01/16 0419 10/02/16 0751  WBC 8.5  --  6.4 5.5 5.5 5.6 7.0  NEUTROABS 5.7  --   --   --   --   --   --   HGB 9.8*  < > 8.3* 7.7* 7.4* 7.4* 10.4*  HCT 30.9*  < > 26.1* 23.5* 22.7* 23.0* 32.1*  MCV 89.6  --  88.8 87.4 86.6 87.8 87.7  PLT 198  --  186 212 216 234 209  < > = values in this interval not displayed. Cardiac Enzymes: No results for input(s): CKTOTAL, CKMB, CKMBINDEX, TROPONINI in the last 168 hours. BNP: BNP (last 3 results) No results for input(s): BNP in the last 8760 hours.  ProBNP (last 3 results) No results for input(s): PROBNP in the last 8760 hours.  CBG:  Recent Labs Lab 10/01/16 0647 10/01/16 1654 10/01/16 2110 10/02/16 0659 10/02/16 1122   GLUCAP 131* 133* 211* 134* 147*       Signed:  Koraima Albertsen  [facility] 10/02/2016, 12:04 PM

## 2016-10-03 DIAGNOSIS — E1122 Type 2 diabetes mellitus with diabetic chronic kidney disease: Secondary | ICD-10-CM | POA: Diagnosis not present

## 2016-10-03 DIAGNOSIS — D631 Anemia in chronic kidney disease: Secondary | ICD-10-CM | POA: Diagnosis not present

## 2016-10-03 DIAGNOSIS — N2581 Secondary hyperparathyroidism of renal origin: Secondary | ICD-10-CM | POA: Diagnosis not present

## 2016-10-03 DIAGNOSIS — N186 End stage renal disease: Secondary | ICD-10-CM | POA: Diagnosis not present

## 2016-10-03 LAB — TYPE AND SCREEN
ABO/RH(D): O POS
Antibody Screen: NEGATIVE
UNIT DIVISION: 0
Unit division: 0

## 2016-10-03 LAB — BPAM RBC
Blood Product Expiration Date: 201804062359
Blood Product Expiration Date: 201804062359
ISSUE DATE / TIME: 201803090007
ISSUE DATE / TIME: 201803090307
Unit Type and Rh: 5100
Unit Type and Rh: 5100

## 2016-10-06 ENCOUNTER — Ambulatory Visit: Payer: Self-pay | Admitting: Internal Medicine

## 2016-10-06 DIAGNOSIS — N2581 Secondary hyperparathyroidism of renal origin: Secondary | ICD-10-CM | POA: Diagnosis not present

## 2016-10-06 DIAGNOSIS — I871 Compression of vein: Secondary | ICD-10-CM | POA: Diagnosis not present

## 2016-10-06 DIAGNOSIS — T82858A Stenosis of vascular prosthetic devices, implants and grafts, initial encounter: Secondary | ICD-10-CM | POA: Diagnosis not present

## 2016-10-06 DIAGNOSIS — N186 End stage renal disease: Secondary | ICD-10-CM | POA: Diagnosis not present

## 2016-10-06 DIAGNOSIS — E1122 Type 2 diabetes mellitus with diabetic chronic kidney disease: Secondary | ICD-10-CM | POA: Diagnosis not present

## 2016-10-06 DIAGNOSIS — Z992 Dependence on renal dialysis: Secondary | ICD-10-CM | POA: Diagnosis not present

## 2016-10-06 DIAGNOSIS — D631 Anemia in chronic kidney disease: Secondary | ICD-10-CM | POA: Diagnosis not present

## 2016-10-07 ENCOUNTER — Ambulatory Visit: Payer: Medicare Other | Admitting: Internal Medicine

## 2016-10-07 ENCOUNTER — Encounter (HOSPITAL_COMMUNITY): Payer: Self-pay | Admitting: General Practice

## 2016-10-07 ENCOUNTER — Inpatient Hospital Stay (HOSPITAL_COMMUNITY)
Admission: AD | Admit: 2016-10-07 | Discharge: 2016-10-09 | DRG: 308 | Disposition: A | Discharge status: Home Health | Payer: Medicare Other | Source: Ambulatory Visit | Attending: Internal Medicine | Admitting: Internal Medicine

## 2016-10-07 ENCOUNTER — Ambulatory Visit (INDEPENDENT_AMBULATORY_CARE_PROVIDER_SITE_OTHER): Payer: Medicare Other | Admitting: *Deleted

## 2016-10-07 ENCOUNTER — Ambulatory Visit (INDEPENDENT_AMBULATORY_CARE_PROVIDER_SITE_OTHER): Payer: Medicare Other | Admitting: Physician Assistant

## 2016-10-07 ENCOUNTER — Encounter: Payer: Self-pay | Admitting: Physician Assistant

## 2016-10-07 VITALS — BP 82/58 | HR 135 | Ht 70.0 in | Wt 210.0 lb

## 2016-10-07 DIAGNOSIS — I110 Hypertensive heart disease with heart failure: Secondary | ICD-10-CM | POA: Diagnosis not present

## 2016-10-07 DIAGNOSIS — N186 End stage renal disease: Secondary | ICD-10-CM

## 2016-10-07 DIAGNOSIS — R791 Abnormal coagulation profile: Secondary | ICD-10-CM | POA: Diagnosis not present

## 2016-10-07 DIAGNOSIS — I959 Hypotension, unspecified: Secondary | ICD-10-CM | POA: Diagnosis present

## 2016-10-07 DIAGNOSIS — Z5181 Encounter for therapeutic drug level monitoring: Secondary | ICD-10-CM | POA: Insufficient documentation

## 2016-10-07 DIAGNOSIS — K219 Gastro-esophageal reflux disease without esophagitis: Secondary | ICD-10-CM | POA: Diagnosis present

## 2016-10-07 DIAGNOSIS — E784 Other hyperlipidemia: Secondary | ICD-10-CM | POA: Diagnosis not present

## 2016-10-07 DIAGNOSIS — I9589 Other hypotension: Secondary | ICD-10-CM

## 2016-10-07 DIAGNOSIS — Z79899 Other long term (current) drug therapy: Secondary | ICD-10-CM

## 2016-10-07 DIAGNOSIS — Z993 Dependence on wheelchair: Secondary | ICD-10-CM | POA: Diagnosis not present

## 2016-10-07 DIAGNOSIS — F329 Major depressive disorder, single episode, unspecified: Secondary | ICD-10-CM | POA: Diagnosis present

## 2016-10-07 DIAGNOSIS — E785 Hyperlipidemia, unspecified: Secondary | ICD-10-CM

## 2016-10-07 DIAGNOSIS — K210 Gastro-esophageal reflux disease with esophagitis: Secondary | ICD-10-CM | POA: Diagnosis not present

## 2016-10-07 DIAGNOSIS — I4891 Unspecified atrial fibrillation: Secondary | ICD-10-CM | POA: Diagnosis not present

## 2016-10-07 DIAGNOSIS — I480 Paroxysmal atrial fibrillation: Principal | ICD-10-CM | POA: Diagnosis present

## 2016-10-07 DIAGNOSIS — Z89512 Acquired absence of left leg below knee: Secondary | ICD-10-CM

## 2016-10-07 DIAGNOSIS — N185 Chronic kidney disease, stage 5: Secondary | ICD-10-CM | POA: Diagnosis not present

## 2016-10-07 DIAGNOSIS — I5042 Chronic combined systolic (congestive) and diastolic (congestive) heart failure: Secondary | ICD-10-CM | POA: Diagnosis present

## 2016-10-07 DIAGNOSIS — G459 Transient cerebral ischemic attack, unspecified: Secondary | ICD-10-CM

## 2016-10-07 DIAGNOSIS — F32A Depression, unspecified: Secondary | ICD-10-CM | POA: Diagnosis present

## 2016-10-07 DIAGNOSIS — Z89511 Acquired absence of right leg below knee: Secondary | ICD-10-CM

## 2016-10-07 DIAGNOSIS — E118 Type 2 diabetes mellitus with unspecified complications: Secondary | ICD-10-CM

## 2016-10-07 DIAGNOSIS — E1122 Type 2 diabetes mellitus with diabetic chronic kidney disease: Secondary | ICD-10-CM | POA: Diagnosis present

## 2016-10-07 DIAGNOSIS — I132 Hypertensive heart and chronic kidney disease with heart failure and with stage 5 chronic kidney disease, or end stage renal disease: Secondary | ICD-10-CM | POA: Diagnosis present

## 2016-10-07 DIAGNOSIS — Z794 Long term (current) use of insulin: Secondary | ICD-10-CM

## 2016-10-07 DIAGNOSIS — Z992 Dependence on renal dialysis: Secondary | ICD-10-CM

## 2016-10-07 DIAGNOSIS — I2510 Atherosclerotic heart disease of native coronary artery without angina pectoris: Secondary | ICD-10-CM | POA: Diagnosis present

## 2016-10-07 DIAGNOSIS — Z8673 Personal history of transient ischemic attack (TIA), and cerebral infarction without residual deficits: Secondary | ICD-10-CM

## 2016-10-07 DIAGNOSIS — B1920 Unspecified viral hepatitis C without hepatic coma: Secondary | ICD-10-CM | POA: Diagnosis present

## 2016-10-07 DIAGNOSIS — I252 Old myocardial infarction: Secondary | ICD-10-CM | POA: Diagnosis not present

## 2016-10-07 DIAGNOSIS — D631 Anemia in chronic kidney disease: Secondary | ICD-10-CM | POA: Diagnosis not present

## 2016-10-07 DIAGNOSIS — Z7901 Long term (current) use of anticoagulants: Secondary | ICD-10-CM

## 2016-10-07 DIAGNOSIS — M898X9 Other specified disorders of bone, unspecified site: Secondary | ICD-10-CM | POA: Diagnosis present

## 2016-10-07 DIAGNOSIS — Z8719 Personal history of other diseases of the digestive system: Secondary | ICD-10-CM

## 2016-10-07 DIAGNOSIS — I120 Hypertensive chronic kidney disease with stage 5 chronic kidney disease or end stage renal disease: Secondary | ICD-10-CM | POA: Diagnosis not present

## 2016-10-07 HISTORY — DX: Unspecified viral hepatitis C without hepatic coma: B19.20

## 2016-10-07 HISTORY — DX: Bipolar disorder, unspecified: F31.9

## 2016-10-07 HISTORY — DX: Gastrointestinal hemorrhage, unspecified: K92.2

## 2016-10-07 HISTORY — DX: Cerebral infarction, unspecified: I63.9

## 2016-10-07 HISTORY — DX: Personal history of urinary calculi: Z87.442

## 2016-10-07 LAB — COMPREHENSIVE METABOLIC PANEL
ALT: 8 U/L — ABNORMAL LOW (ref 17–63)
AST: 10 U/L — ABNORMAL LOW (ref 15–41)
Albumin: 3.2 g/dL — ABNORMAL LOW (ref 3.5–5.0)
Alkaline Phosphatase: 43 U/L (ref 38–126)
Anion gap: 14 (ref 5–15)
BUN: 71 mg/dL — ABNORMAL HIGH (ref 6–20)
CO2: 26 mmol/L (ref 22–32)
Calcium: 8.9 mg/dL (ref 8.9–10.3)
Chloride: 99 mmol/L — ABNORMAL LOW (ref 101–111)
Creatinine, Ser: 12.64 mg/dL — ABNORMAL HIGH (ref 0.61–1.24)
GFR calc Af Amer: 4 mL/min — ABNORMAL LOW (ref >60)
GFR calc non Af Amer: 4 mL/min — ABNORMAL LOW (ref >60)
Glucose, Bld: 152 mg/dL — ABNORMAL HIGH (ref 65–99)
Potassium: 3.7 mmol/L (ref 3.5–5.1)
Sodium: 139 mmol/L (ref 135–145)
Total Bilirubin: 0.5 mg/dL (ref 0.3–1.2)
Total Protein: 7 g/dL (ref 6.5–8.1)

## 2016-10-07 LAB — CBC
HCT: 30.3 % — ABNORMAL LOW (ref 39.0–52.0)
HEMOGLOBIN: 10 g/dL — AB (ref 13.0–17.0)
MCH: 29.5 pg (ref 26.0–34.0)
MCHC: 33 g/dL (ref 30.0–36.0)
MCV: 89.4 fL (ref 78.0–100.0)
Platelets: 204 10*3/uL (ref 150–400)
RBC: 3.39 MIL/uL — AB (ref 4.22–5.81)
RDW: 17.4 % — ABNORMAL HIGH (ref 11.5–15.5)
WBC: 7.1 10*3/uL (ref 4.0–10.5)

## 2016-10-07 LAB — MAGNESIUM: Magnesium: 2.3 mg/dL (ref 1.7–2.4)

## 2016-10-07 LAB — POCT INR: INR: 1.4

## 2016-10-07 LAB — GLUCOSE, CAPILLARY
GLUCOSE-CAPILLARY: 146 mg/dL — AB (ref 65–99)
Glucose-Capillary: 177 mg/dL — ABNORMAL HIGH (ref 65–99)

## 2016-10-07 LAB — MRSA PCR SCREENING: MRSA BY PCR: NEGATIVE

## 2016-10-07 LAB — HEPARIN LEVEL (UNFRACTIONATED): HEPARIN UNFRACTIONATED: 0.68 [IU]/mL (ref 0.30–0.70)

## 2016-10-07 MED ORDER — HEPARIN BOLUS VIA INFUSION
4000.0000 [IU] | Freq: Once | INTRAVENOUS | Status: AC
  Administered 2016-10-07: 4000 [IU] via INTRAVENOUS
  Filled 2016-10-07: qty 4000

## 2016-10-07 MED ORDER — AMIODARONE HCL IN DEXTROSE 360-4.14 MG/200ML-% IV SOLN: 60.0000 mg/h | INTRAVENOUS | Status: DC

## 2016-10-07 MED ORDER — AMIODARONE HCL IN DEXTROSE 360-4.14 MG/200ML-% IV SOLN
60.0000 mg/h | INTRAVENOUS | Status: DC
  Administered 2016-10-07: 60 mg/h via INTRAVENOUS
  Filled 2016-10-07: qty 200

## 2016-10-07 MED ORDER — AMIODARONE HCL IN DEXTROSE 360-4.14 MG/200ML-% IV SOLN
30.0000 mg/h | INTRAVENOUS | Status: DC
  Administered 2016-10-07 – 2016-10-08 (×2): 30 mg/h via INTRAVENOUS
  Filled 2016-10-07 (×2): qty 200

## 2016-10-07 MED ORDER — INSULIN ASPART 100 UNIT/ML ~~LOC~~ SOLN: 0.0000 [IU] | Freq: Every day | SUBCUTANEOUS | Status: DC

## 2016-10-07 MED ORDER — AMIODARONE LOAD VIA INFUSION
150.0000 mg | Freq: Once | INTRAVENOUS | Status: DC
  Filled 2016-10-07: qty 83.34

## 2016-10-07 MED ORDER — INSULIN ASPART 100 UNIT/ML ~~LOC~~ SOLN
0.0000 [IU] | Freq: Three times a day (TID) | SUBCUTANEOUS | Status: DC
  Administered 2016-10-09: 2 [IU] via SUBCUTANEOUS

## 2016-10-07 MED ORDER — WARFARIN - PHARMACIST DOSING INPATIENT: Freq: Every day | Status: DC

## 2016-10-07 MED ORDER — HEPARIN (PORCINE) IN NACL 100-0.45 UNIT/ML-% IJ SOLN: 1200.0000 [IU]/h | INTRAMUSCULAR | Status: DC

## 2016-10-07 MED ORDER — HEPARIN (PORCINE) IN NACL 100-0.45 UNIT/ML-% IJ SOLN
800.0000 [IU]/h | INTRAMUSCULAR | Status: DC
  Administered 2016-10-07: 1200 [IU]/h via INTRAVENOUS
  Filled 2016-10-07 (×4): qty 250

## 2016-10-07 MED ORDER — WARFARIN SODIUM 5 MG PO TABS
5.0000 mg | ORAL_TABLET | Freq: Once | ORAL | Status: DC
  Filled 2016-10-07: qty 1

## 2016-10-07 MED ORDER — AMIODARONE HCL IN DEXTROSE 360-4.14 MG/200ML-% IV SOLN: 30.0000 mg/h | INTRAVENOUS | Status: DC

## 2016-10-07 NOTE — Progress Notes (Signed)
Cardiology Office Note:    Date:  10/07/2016   ID:  Oscar Castillo, DOB 01-25-1951, MRN [id number]  PCP:  [name], MD  Cardiologist:  Dr. Dorris Carnes   Electrophysiologist:  n/a  Referring MD: [name], MD   Chief Complaint  Patient presents with  . Hospitalization Follow-up    GI bleed, atrial fibrillation    History of Present Illness:    Oscar Castillo is a 66 y.o. male with a hx of ESRD on hemodialysis, chronic combined systolic and diastolic CHF, prior stroke, diabetes, HTN, hepatitis C, paroxysmal AF, status post bilateral BKA's, LBBB.  He was admitted in 2017 with AF with RVR. LV function was normal on echocardiogram. He had artifact on his Myoview that was explained by LBBB but no ischemia. Last seen by Dr. Harrington Challenger 10/17.  CHADS2-VASc=7 (CHF, HTN, age, vascular dz, stroke, DM) . . . On Coumadin.  Patient was evaluated in 2/18 for heme positive stools. EGD demonstrated esophagitis, gastritis/duodenitis. He was then admitted 3/3-3/9 with blood loss anemia in the setting of melena and suspected hematemesis. He required transfusion with PRBCs. Capsule endoscopy was reportedly unremarkable. Coumadin was resumed at discharge without Lovenox bridging. He returns for post hospital position follow-up.  He is here today with his wife. Since DC from the hospital, he has felt well. He was unable to complete dialysis yesterday because his AV fistula clotted. He was seen by vascular surgery with revision done yesterday. He denies chest pain, significant dyspnea, orthopnea, PND. He denies any edema in his thighs. He denies syncope or dizziness. He denies any recurrent bleeding.   Prior CV studies:   The following studies were reviewed today:  Myoview 6/17 EF 36, anteroseptal and inferoseptal perfusion defect due to artifact from LBBB, no ischemia, intermediate risk  Echo 6/17 Mild concentric LVH, EF 50-55, normal wall motion, mild MR, mildly restricted aortic valve mobility  Echo  3/15 EF 55-65, PASP 32  Echo 11/13 Severe concentric LVH, EF 45, diffuse HK, grade 2 diastolic dysfunction, mild MR, moderate LAE, trivial pericardial effusion   Past Medical History:  Diagnosis Date  . Anemia   . Antral ulcer 2014   small  . BACK PAIN, LUMBAR, CHRONIC   . BENIGN PROSTATIC HYPERTROPHY   . CEREBROVASCULAR ACCIDENT, HX OF   . CHOLELITHIASIS   . Chronic combined systolic and diastolic CHF (congestive heart failure) ([place])   . Complication of anesthesia    wife states pt had trouble waking up with his last surgery in Nov., 2014  . DEPRESSION   . DIABETES MELLITUS, TYPE II   . ERECTILE DYSFUNCTION   . ESRD on hemodialysis ([place])    ESRD due to DM/HTN. Started dialysis in November 2013.  HD TTS at [facility] on [address].  [name] GERD   . Hemorrhoids   . HEPATITIS C, HX OF   . History of Clostridium difficile   . HYPERTENSION   . LBBB (left bundle branch block)   . Morbid obesity ([place])   . NEPHROLITHIASIS, HX OF   . PAF (paroxysmal atrial fibrillation) ([place])    a. Dx 12/2015.  . S/P bilateral BKA (below knee amputation) [facility])     Past Surgical History:  Procedure Laterality Date  . AMPUTATION Left 05/12/2013   Procedure: AMPUTATION RAY;  Surgeon: Newt Minion, MD;  Location: [place];  Service: Orthopedics;  Laterality: Left;  Left Foot 1st Ray Amputation  . AMPUTATION Left 06/09/2013   Procedure: AMPUTATION BELOW  KNEE;  Surgeon: Newt Minion, MD;  Location: [place];  Service: Orthopedics;  Laterality: Left;  Left Below Knee Amputation and removal proximal screws IM tibial nail  . AMPUTATION Right 09/08/2013   Procedure: AMPUTATION BELOW KNEE;  Surgeon: Newt Minion, MD;  Location: [place];  Service: Orthopedics;  Laterality: Right;  Right Below Knee Amputation  . AMPUTATION Right 10/11/2013   Procedure: AMPUTATION BELOW KNEE;  Surgeon: Newt Minion, MD;  Location: [place];  Service: Orthopedics;  Laterality: Right;  Right Below Knee Amputation Revision  . AV FISTULA  PLACEMENT  06/14/2012   Procedure: ARTERIOVENOUS (AV) FISTULA CREATION;  Surgeon: Angelia Mould, MD;  Location: [facility] OR;  Service: Vascular;  Laterality: Left;  Left basilic vein transposition with fistula.  . COLONOSCOPY N/A 10/28/2012   Procedure: COLONOSCOPY;  Surgeon: Jeryl Columbia, MD;  Location: [facility] ENDOSCOPY;  Service: Endoscopy;  Laterality: N/A;  . COLONOSCOPY N/A 11/02/2012   Procedure: COLONOSCOPY;  Surgeon: Cleotis Nipper, MD;  Location: [facility] ENDOSCOPY;  Service: Endoscopy;  Laterality: N/A;  . COLONOSCOPY N/A 11/03/2012   Procedure: COLONOSCOPY;  Surgeon: Cleotis Nipper, MD;  Location: [facility] ENDOSCOPY;  Service: Endoscopy;  Laterality: N/A;  . COLONOSCOPY N/A 09/16/2016   Procedure: COLONOSCOPY;  Surgeon: Jerene Bears, MD;  Location: [facility] ENDOSCOPY;  Service: Gastroenterology;  Laterality: N/A;  . ENTEROSCOPY N/A 11/08/2012   Procedure: ENTEROSCOPY;  Surgeon: Wonda Horner, MD;  [facility] END[facility]COPY;  Service: Endoscopy;  Laterality: N/A;  . ESOPHAGOGASTRODUODENOSCOPY N/A 11/02/2012   Procedure: ESOPHAGOGASTRODUODENOSCOPY (EGD);  Surgeon: Cleotis Nipper, MD;  Location: [facility] ENDOSCOPY;  Service: Endoscopy;  Laterality: N/A;  . ESOPHAGOGASTRODUODENOSCOPY (EGD) WITH PROPOFOL N/A 09/16/2016   Procedure: ESOPHAGOGASTRODUODENOSCOPY (EGD) WITH PROPOFOL;  Surgeon: Jerene Bears, MD;  Location: [facility] ENDOSCOPY;  Service: Gastroenterology;  Laterality: N/A;  . EYE SURGERY Left    to remove scar tissue  . GIVENS CAPSULE STUDY N/A 11/04/2012   Procedure: GIVENS CAPSULE STUDY;  Surgeon: Cleotis Nipper, MD;  Location: [facility] ENDOSCOPY;  Service: Endoscopy;  Laterality: N/A;  . GIVENS CAPSULE STUDY N/A 09/29/2016   Procedure: GIVENS CAPSULE STUDY;  Surgeon: Manus Gunning, MD;  Location: [place];  Service: Gastroenterology;  Laterality: N/A;  try to keep pt up in bedside chair as much as possible during the study.    [name] HARDWARE REMOVAL Left 06/09/2013   Procedure: HARDWARE REMOVAL;  Surgeon: Newt Minion, MD;  Location: [place];  Service: Orthopedics;  Laterality: Left;  Left Below Knee Amputation  and Removal proximal screws IM tibial nail  . IR GENERIC HISTORICAL  09/27/2016   IR US GUIDE VASC ACCESS RIGHT 09/27/2016 Aletta Edouard, MD [facility]-INTER[facility] RAD  . IR GENERIC HISTORICAL  09/27/2016   IR FLUORO GUIDE CV LINE RIGHT 09/27/2016 Aletta Edouard, MD [facility]INTER[facility]  . NEPHRECTOMY     partial RR  . ORIF FIBULA FRACTURE Left 09/09/2012   Procedure: OPEN REDUCTION INTERNAL FIXATION (ORIF) FIBULA FRACTURE;  Surgeon: Johnny Bridge, MD;  Location: [place];  Service: Orthopedics;  Laterality: Left;  . TIBIA IM NAIL INSERTION Left 09/09/2012   Procedure: INTRAMEDULLARY (IM) NAIL TIBIAL;  Surgeon: Johnny Bridge, MD;  Location: [place];  Service: Orthopedics;  Laterality: Left;  left tibial nail and open reduction internal fixation left fibula fracture    Current Medications: Current Meds  Medication Sig  . acetaminophen (TYLENOL) 500 MG tablet Take 500 mg by mouth every 6 (six) hours as needed for mild  pain.  . atorvastatin (LIPITOR) 40 MG tablet TAKE 1 TABLET(40 MG) BY MOUTH DAILY AT 6 PM  . calcium acetate (PHOSLO) 667 MG capsule Take 2,001 mg by mouth 3 (three) times daily with meals.  . cyclobenzaprine (FLEXERIL) 5 MG tablet Take 1 tablet (5 mg total) by mouth 3 (three) times daily as needed for muscle spasms.  . feeding supplement (BOOST / RESOURCE BREEZE) LIQD Take 1 Container by mouth 2 (two) times daily between meals.  [name] HYDROcodone-acetaminophen (NORCO/VICODIN) 5-325 MG tablet Take 1 tablet by mouth every 6 (six) hours as needed for moderate pain.  . hydrOXYzine (ATARAX/VISTARIL) 25 MG tablet Take 25 mg by mouth every 6 (six) hours as needed for anxiety.  . insulin aspart (NOVOLOG) 100 UNIT/ML injection Inject 0-9 Units into the skin 3 (three) times daily with meals.  [name] ketoconazole (NIZORAL) 2 % cream Apply 1 application topically daily as needed for irritation.  [name] latanoprost (XALATAN) 0.005 %  ophthalmic solution Place 1 drop into both eyes at bedtime.   . lidocaine-prilocaine (EMLA) cream APPLY A SMALL AMOUNT TO SKIN AT THE ACCESS SITE (AVF) AS DIRECTED BEFORE EACH DIALYSIS SESSION THREE DAYS A WEEK  . ondansetron (ZOFRAN ODT) 4 MG disintegrating tablet 4mg  ODT q4 hours prn nausea/vomit  . pantoprazole (PROTONIX) 40 MG tablet Take 1 tablet (40 mg total) by mouth 2 (two) times daily before a meal.  . polyethylene glycol (MIRALAX / GLYCOLAX) packet Take 17 g by mouth 2 (two) times daily.  . QUEtiapine (SEROQUEL) 25 MG tablet Take 1 tablet (25 mg total) by mouth 2 (two) times daily.  . SENSIPAR 30 MG tablet Take 30 mg by mouth every evening.   . silver sulfADIAZINE (SILVADENE) 1 % cream Apply 1 application topically daily as needed (irratation).  . traMADol (ULTRAM) 50 MG tablet Take 50 mg by mouth every 8 (eight) hours as needed for moderate pain or severe pain.  [name] triamcinolone cream (KENALOG) 0.1 % Apply 1 application topically as needed (irritation).   . VOLTAREN 1 % GEL APPLY 2 GRAMS TO HANDS BID TO TID AS NEEDED FOR PAIN.  [name] warfarin (COUMADIN) 2.5 MG tablet Take 2.5mg  nightly, check INR on 10/05/2016     Allergies:   Patient has no known allergies.   Social History   Social History  . Marital status: Married    Spouse name: N/A  . Number of children: 2  . Years of education: 16   Occupational History  . disabled due to stroke Retired   Social History Main Topics  . Smoking status: Never Smoker  . Smokeless tobacco: Never Used  . Alcohol use No  . Drug use: No  . Sexual activity: Yes    Birth control/ protection: None   Other Topics Concern  . 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 Diabetes Mother   . Hypertension Mother   . Heart attack Father   . Hypertension Father   . Coronary artery disease Other      ROS:   Please  see the history of present illness.    ROS All other systems reviewed and are negative.   EKGs/Labs/Other Test Reviewed:    EKG:  EKG is  ordered today.  The ekg ordered today demonstrates Atrial fibrillation, HR 135, LBBB  Recent Labs: 12/27/2015: Magnesium 2.2 09/26/2016:  ALT 12 10/02/2016: BUN 19; Creatinine, Ser 6.41; Hemoglobin 10.4; Platelets 209; Potassium 3.8; Sodium 134   Recent Lipid Panel    Component Value Date/Time   CHOL 102 (L) 05/14/2016 1634   TRIG 61 05/14/2016 1634   HDL 36 (L) 05/14/2016 1634   CHOLHDL 2.8 05/14/2016 1634   VLDL 12 05/14/2016 1634   LDLCALC 54 05/14/2016 1634     Physical Exam:    VS:  BP (!) 82/58 (BP Location: Right Arm)   Pulse (!) 135   Ht 5\' 10"  (1.778 m)   Wt 210 lb (95.3 kg)   BMI 30.13 kg/m     Wt Readings from Last 3 Encounters:  10/07/16 210 lb (95.3 kg)  10/01/16 185 lb 3 oz (84 kg)  09/16/16 230 lb (104.3 kg)     Physical Exam  Constitutional: He is oriented to person, place, and time. He appears well-developed and well-nourished. No distress.  HENT:  Head: Normocephalic and atraumatic.  Neck: No JVD present.  Cardiovascular: An irregularly irregular rhythm present. Tachycardia present.   No murmur heard. Pulmonary/Chest: He has no wheezes. He has no rales.  Abdominal: Soft. There is no tenderness.  Musculoskeletal: He exhibits no edema.  Neurological: He is alert and oriented to person, place, and time.  Skin: Skin is warm and dry.  Psychiatric: He has a normal mood and affect.    ASSESSMENT:    1. Atrial fibrillation with RVR ([place])   2. Chronic combined systolic and diastolic congestive heart failure   3. History of GI bleed   4. Hypertensive heart disease with CHF (congestive heart failure) ([place])   5. Hyperlipidemia, unspecified hyperlipidemia type   6. ESRD (end stage renal disease) ([place])   7. S/P bilateral BKA (below knee amputation) ([place])    PLAN:    In order of problems listed above:  1. Atrial  fibrillation with RVR ([place]) -  He presents to the office today for hospitalization follow-up after recent admission for blood loss anemia in the setting of GI blood loss requiring transfusion with PRBCs. His Coumadin was held. His metoprolol and diltiazem were both held as well due to hypotension. Coumadin was resumed at discharge. His INR today is 1.4. Today he is back in atrial fibrillation with RVR. He is hypotensive. Fortunately, he is asymptomatic. However, with his multiple comorbid conditions, he should be admitted to the hospital for rate control. He was also seen by Dr. Marlou Porch, who agreed. Given his multiple comorbid conditions, we will ask the hospitalist service to admit him and we will remain as consultants.  -  IV heparin per pharmacy  -  Coumadin per pharmacy  -  IV amiodarone for rate control  2. Chronic combined systolic and diastolic congestive heart failure - Volume is stable. Volume is managed by hemodialysis. He will need nephrology to follow him as well to continue his dialysis.  3. History of GI bleed - No recurrent evidence of bleeding.  4. Hypertensive heart disease with CHF (congestive heart failure) ([place] -  Blood pressure is low today. As noted, metoprolol and diltiazem were both held secondary to hypotension during his most recent hospitalization.  5. Hyperlipidemia, unspecified hyperlipidemia type -  Continue statin.  6. ESRD (end stage renal disease) ([place]) -  Tuesday, Thursday, Saturday dialysis.  7. S/P bilateral BKA (below knee amputation) ([place]) -  He is nonambulatory.   Dispo:  Return for After Dischage from Hospital, w/ Dr. Harrington Challenger.   Medication Adjustments/Labs and Tests Ordered: Current  medicines are reviewed at length with the patient today.  Concerns regarding medicines are outlined above.  Medication changes, Labs and Tests ordered today are outlined in the Patient Instructions noted below. Patient Instructions  You are being admitted to [facility] to  unit [place], Oscar Castillo, Oscar Castillo  10/07/2016 10:25 AM    [address], [place], [place]  [address] Phone: [phone]; Fax: [phone]

## 2016-10-07 NOTE — Progress Notes (Signed)
ANTICOAGULATION CONSULT NOTE - Initial Consult  Pharmacy Consult for heparin>>warfarin Indication: atrial fibrillation  No Known Allergies  Patient Measurements: Height: 5\' 10"  (177.8 cm) Weight: 191 lb 8 oz (86.9 kg) IBW/kg (Calculated) : 73 Heparin Dosing Weight: 92kg  Vital Signs: Temp: 98.7 F (37.1 C) (03/14 1104) Temp Source: Oral (03/14 1104) BP: 125/65 (03/14 1104)  Labs:  Recent Labs  10/07/16 0823 10/07/16 1124 10/07/16 1256 10/07/16 2048  HGB  --  10.0*  --   --   HCT  --  30.3*  --   --   PLT  --  204  --   --   INR 1.4  --   --   --   HEPARINUNFRC  --   --   --  0.68  CREATININE  --   --  12.64*  --     Estimated Creatinine Clearance: 6 mL/min (by C-G formula based on SCr of 12.64 mg/dL (H)).   Medical History: Past Medical History:  Diagnosis Date  . Anemia   . Antral ulcer 2014   small  . Atrial fibrillation with RVR ([place]) 10/07/2016  . BACK PAIN, LUMBAR, CHRONIC   . BENIGN PROSTATIC HYPERTROPHY   . Bipolar disorder ([place])    "sometimes" (10/07/2016)  . CHOLELITHIASIS   . Chronic combined systolic and diastolic CHF (congestive heart failure) ([place])   . Complication of anesthesia    wife states pt had trouble waking up with his last surgery in Nov., 2014  . CVA (cerebral vascular accident) ([place])   . DEPRESSION   . DIABETES MELLITUS, TYPE II    "not anymore" (10/07/2016)  . ERECTILE DYSFUNCTION   . ESRD (end stage renal disease) on dialysis [facility])    "[place]; TTS" (10/07/2016)  . ESRD on hemodialysis ([place])    ESRD due to DM/HTN. Started dialysis in November 2013.  HD TTS at [facility] on [address].  [name] GERD   . GI bleed    due to gastritis, discharged 10/02/16/notes 10/07/2016  . Headache    "monthly" (10/07/2016)  . Hemorrhoids   . Hepatitis C   . History of blood transfusion    "related to dialysis" (10/07/2016)  . History of Clostridium difficile   . History of kidney stones   . HYPERTENSION   . LBBB (left bundle branch block)   .  Morbid obesity ([place])   . PAF (paroxysmal atrial fibrillation) ([place])    a. Dx 12/2015.   Assessment: a 66 y.o. male with a hx of ESRD on hemodialysis, chronic combined systolic and diastolic CHF, prior stroke, diabetes, HTN, hepatitis C, paroxysmal AF, status post bilateral BKA's, LBBB.   Patient is back in afib with rvr this am and symptomatic with hypotension to be admitted to hospital.   INR this am is 1.4, orders received to start IV heparin/warfarin bridge. Patient being started on IV amiodarone so will need to watch INR closely.   PTA home dose 3.75mg  daily except 2.5mg  on sundays  Initial hep lvl at goal  Goal of Therapy:  INR 2-3 Heparin level 0.3-0.7 units/ml Monitor platelets by anticoagulation protocol: Yes   Plan:  Continue heparin 1200 units/hr Warfarin 5mg  tonight Daily INR/CBC/heparin level  Levester Fresh, PharmD, BCPS, BCCCP Clinical Pharmacist 10/07/2016 9:55 PM

## 2016-10-07 NOTE — Consult Note (Signed)
Cardiology Consult:    Date:  10/07/2016   ID:  Oscar Castillo, DOB 04/22/1951, MRN [id number]  PCP:  [name], MD  Cardiologist:  Dr. Dorris Carnes   Electrophysiologist:  n/a  Referring MD: [name], MD   Chief Complaint  Patient presents with  . Hospitalization Follow-up    GI bleed, atrial fibrillation    History of Present Illness:    Oscar Castillo is a 66 y.o. male with a hx of ESRD on hemodialysis, chronic combined systolic and diastolic CHF, prior stroke, diabetes, HTN, hepatitis C, paroxysmal AF, status post bilateral BKA's, LBBB.  He was admitted in 2017 with AF with RVR. LV function was normal on echocardiogram. He had artifact on his Myoview that was explained by LBBB but no ischemia. Last seen by Dr. Harrington Challenger 10/17.  CHADS2-VASc=7 (CHF, HTN, age, vascular dz, stroke, DM) . . . On Coumadin.  Patient was evaluated in 2/18 for heme positive stools. EGD demonstrated esophagitis, gastritis/duodenitis. He was then admitted 3/3-3/9 with blood loss anemia in the setting of melena and suspected hematemesis. He required transfusion with PRBCs. Capsule endoscopy was reportedly unremarkable. Coumadin was resumed at discharge without Lovenox bridging. He returns for post hospital position follow-up.  He is here today with his wife. Since DC from the hospital, he has felt well. He was unable to complete dialysis yesterday because his AV fistula clotted. He was seen by vascular surgery with revision done yesterday. He denies chest pain, significant dyspnea, orthopnea, PND. He denies any edema in his thighs. He denies syncope or dizziness. He denies any recurrent bleeding.   Prior CV studies:   The following studies were reviewed today:  Myoview 6/17 EF 36, anteroseptal and inferoseptal perfusion defect due to artifact from LBBB, no ischemia, intermediate risk  Echo 6/17 Mild concentric LVH, EF 50-55, normal wall motion, mild MR, mildly restricted aortic valve mobility  Echo 3/15 EF  55-65, PASP 32  Echo 11/13 Severe concentric LVH, EF 45, diffuse HK, grade 2 diastolic dysfunction, mild MR, moderate LAE, trivial pericardial effusion   Past Medical History:  Diagnosis Date  . Anemia   . Antral ulcer 2014   small  . BACK PAIN, LUMBAR, CHRONIC   . BENIGN PROSTATIC HYPERTROPHY   . CEREBROVASCULAR ACCIDENT, HX OF   . CHOLELITHIASIS   . Chronic combined systolic and diastolic CHF (congestive heart failure) ([place])   . Complication of anesthesia    wife states pt had trouble waking up with his last surgery in Nov., 2014  . DEPRESSION   . DIABETES MELLITUS, TYPE II   . ERECTILE DYSFUNCTION   . ESRD on hemodialysis ([place])    ESRD due to DM/HTN. Started dialysis in November 2013.  HD TTS at [facility] on [place].  [name] GERD   . Hemorrhoids   . HEPATITIS C, HX OF   . History of Clostridium difficile   . HYPERTENSION   . LBBB (left bundle branch block)   . Morbid obesity ([place])   . NEPHROLITHIASIS, HX OF   . PAF (paroxysmal atrial fibrillation) ([place])    a. Dx 12/2015.  . S/P bilateral BKA (below knee amputation) [facility])     Past Surgical History:  Procedure Laterality Date  . AMPUTATION Left 05/12/2013   Procedure: AMPUTATION RAY;  Surgeon: Newt Minion, MD;  Location: [place];  Service: Orthopedics;  Laterality: Left;  Left Foot 1st Ray Amputation  . AMPUTATION Left 06/09/2013   Procedure: AMPUTATION BELOW KNEE;  Surgeon: Newt Minion, MD;  Location: [place];  Service: Orthopedics;  Laterality: Left;  Left Below Knee Amputation and removal proximal screws IM tibial nail  . AMPUTATION Right 09/08/2013   Procedure: AMPUTATION BELOW KNEE;  Surgeon: Newt Minion, MD;  Location: [place];  Service: Orthopedics;  Laterality: Right;  Right Below Knee Amputation  . AMPUTATION Right 10/11/2013   Procedure: AMPUTATION BELOW KNEE;  Surgeon: Newt Minion, MD;  Location: [place];  Service: Orthopedics;  Laterality: Right;  Right Below Knee Amputation Revision  . AV FISTULA PLACEMENT   06/14/2012   Procedure: ARTERIOVENOUS (AV) FISTULA CREATION;  Surgeon: Angelia Mould, MD;  Location: [facility] OR;  Service: Vascular;  Laterality: Left;  Left basilic vein transposition with fistula.  . COLONOSCOPY N/A 10/28/2012   Procedure: COLONOSCOPY;  Surgeon: Jeryl Columbia, MD;  Location: [facility] ENDOSCOPY;  Service: Endoscopy;  Laterality: N/A;  . COLONOSCOPY N/A 11/02/2012   Procedure: COLONOSCOPY;  Surgeon: Cleotis Nipper, MD;  Location: [facility] ENDOSCOPY;  Service: Endoscopy;  Laterality: N/A;  . COLONOSCOPY N/A 11/03/2012   Procedure: COLONOSCOPY;  Surgeon: Cleotis Nipper, MD;  Location: [facility] ENDOSCOPY;  Service: Endoscopy;  Laterality: N/A;  . COLONOSCOPY N/A 09/16/2016   Procedure: COLONOSCOPY;  Surgeon: Jerene Bears, MD;  Location: [facility] ENDOSCOPY;  Service: Gastroenterology;  Laterality: N/A;  . ENTEROSCOPY N/A 11/08/2012   Procedure: ENTEROSCOPY;  Surgeon: Wonda Horner, MD;  Location: [facility] ENDOSCOPY;  Service: Endoscopy;  Laterality: N/A;  . ESOPHAGOGASTRODUODENOSCOPY N/A 11/02/2012   Procedure: ESOPHAGOGASTRODUODENOSCOPY (EGD);  Surgeon: Cleotis Nipper, MD;  Location: [facility] ENDOSCOPY;  Service: Endoscopy;  Laterality: N/A;  . ESOPHAGOGASTRODUODENOSCOPY (EGD) WITH PROPOFOL N/A 09/16/2016   Procedure: ESOPHAGOGASTRODUODENOSCOPY (EGD) WITH PROPOFOL;  Surgeon: Jerene Bears, MD;  Location: [facility] ENDOSCOPY;  Service: Gastroenterology;  Laterality: N/A;  . EYE SURGERY Left    to remove scar tissue  . GIVENS CAPSULE STUDY N/A 11/04/2012   Procedure: GIVENS CAPSULE STUDY;  Surgeon: Cleotis Nipper, MD;  Location: [facility] ENDOSCOPY;  Service: Endoscopy;  Laterality: N/A;  . GIVENS CAPSULE STUDY N/A 09/29/2016   Procedure: GIVENS CAPSULE STUDY;  Surgeon: Manus Gunning, MD;  Location: [place];  Service: Gastroenterology;  Laterality: N/A;  try to keep pt up in bedside chair as much as possible during the study.    [name] HARDWARE REMOVAL Left 06/09/2013   Procedure: HARDWARE REMOVAL;  Surgeon: Newt Minion,  MD;  Location: [place];  Service: Orthopedics;  Laterality: Left;  Left Below Knee Amputation  and Removal proximal screws IM tibial nail  . IR GENERIC HISTORICAL  09/27/2016   IR US GUIDE VASC ACCESS RIGHT 09/27/2016 Aletta Edouard, MD [facility]  . IR GENERIC HISTORICAL  09/27/2016   IR FLUORO GUIDE CV LINE RIGHT 09/27/2016 Aletta Edouard, MD [facility]  . NEPHRECTOMY     partial RR  . ORIF FIBULA FRACTURE Left 09/09/2012   Procedure: OPEN REDUCTION INTERNAL FIXATION (ORIF) FIBULA FRACTURE;  Surgeon: Johnny Bridge, MD;  Location: [place];  Service: Orthopedics;  Laterality: Left;  . TIBIA IM NAIL INSERTION Left 09/09/2012   Procedure: INTRAMEDULLARY (IM) NAIL TIBIAL;  Surgeon: Johnny Bridge, MD;  Location: [place];  Service: Orthopedics;  Laterality: Left;  left tibial nail and open reduction internal fixation left fibula fracture    Current Medications: Current Meds  Medication Sig  . acetaminophen (TYLENOL) 500 MG tablet Take 500 mg by mouth every 6 (six) hours as needed for mild pain.  [name]  atorvastatin (LIPITOR) 40 MG tablet TAKE 1 TABLET(40 MG) BY MOUTH DAILY AT 6 PM  . calcium acetate (PHOSLO) 667 MG capsule Take 2,001 mg by mouth 3 (three) times daily with meals.  . cyclobenzaprine (FLEXERIL) 5 MG tablet Take 1 tablet (5 mg total) by mouth 3 (three) times daily as needed for muscle spasms.  . feeding supplement (BOOST / RESOURCE BREEZE) LIQD Take 1 Container by mouth 2 (two) times daily between meals.  [name] HYDROcodone-acetaminophen (NORCO/VICODIN) 5-325 MG tablet Take 1 tablet by mouth every 6 (six) hours as needed for moderate pain.  . hydrOXYzine (ATARAX/VISTARIL) 25 MG tablet Take 25 mg by mouth every 6 (six) hours as needed for anxiety.  . insulin aspart (NOVOLOG) 100 UNIT/ML injection Inject 0-9 Units into the skin 3 (three) times daily with meals.  [name] ketoconazole (NIZORAL) 2 % cream Apply 1 application topically daily as needed for irritation.  [name] latanoprost (XALATAN) 0.005 % ophthalmic  solution Place 1 drop into both eyes at bedtime.   . lidocaine-prilocaine (EMLA) cream APPLY A SMALL AMOUNT TO SKIN AT THE ACCESS SITE (AVF) AS DIRECTED BEFORE EACH DIALYSIS SESSION THREE DAYS A WEEK  . ondansetron (ZOFRAN ODT) 4 MG disintegrating tablet 4mg  ODT q4 hours prn nausea/vomit  . pantoprazole (PROTONIX) 40 MG tablet Take 1 tablet (40 mg total) by mouth 2 (two) times daily before a meal.  . polyethylene glycol (MIRALAX / GLYCOLAX) packet Take 17 g by mouth 2 (two) times daily.  . QUEtiapine (SEROQUEL) 25 MG tablet Take 1 tablet (25 mg total) by mouth 2 (two) times daily.  . SENSIPAR 30 MG tablet Take 30 mg by mouth every evening.   . silver sulfADIAZINE (SILVADENE) 1 % cream Apply 1 application topically daily as needed (irratation).  . traMADol (ULTRAM) 50 MG tablet Take 50 mg by mouth every 8 (eight) hours as needed for moderate pain or severe pain.  [name] triamcinolone cream (KENALOG) 0.1 % Apply 1 application topically as needed (irritation).   . VOLTAREN 1 % GEL APPLY 2 GRAMS TO HANDS BID TO TID AS NEEDED FOR PAIN.  [name] warfarin (COUMADIN) 2.5 MG tablet Take 2.5mg  nightly, check INR on 10/05/2016     Allergies:   Patient has no known allergies.   Social History   Social History  . Marital status: Married    Spouse name: N/A  . Number of children: 2  . Years of education: 16   Occupational History  . disabled due to stroke Retired   Social History Main Topics  . Smoking status: Never Smoker  . Smokeless tobacco: Never Used  . Alcohol use No  . Drug use: No  . Sexual activity: Yes    Birth control/ protection: None   Other Topics Concern  . 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 Diabetes Mother   . Hypertension Mother   . Heart attack Father   . Hypertension Father   . Coronary artery disease Other      ROS:   Please see the  history of present illness.    ROS All other systems reviewed and are negative.   EKGs/Labs/Other Test Reviewed:    EKG:  EKG is  ordered today.  The ekg ordered today demonstrates Atrial fibrillation, HR 135, LBBB  Recent Labs: 12/27/2015: Magnesium 2.2 09/26/2016: ALT 12 10/02/2016:  BUN 19; Creatinine, Ser 6.41; Hemoglobin 10.4; Platelets 209; Potassium 3.8; Sodium 134   Recent Lipid Panel    Component Value Date/Time   CHOL 102 (L) 05/14/2016 1634   TRIG 61 05/14/2016 1634   HDL 36 (L) 05/14/2016 1634   CHOLHDL 2.8 05/14/2016 1634   VLDL 12 05/14/2016 1634   LDLCALC 54 05/14/2016 1634     Physical Exam:    VS:  BP (!) 82/58 (BP Location: Right Arm)   Pulse (!) 135   Ht 5\' 10"  (1.778 m)   Wt 210 lb (95.3 kg)   BMI 30.13 kg/m     Wt Readings from Last 3 Encounters:  10/07/16 210 lb (95.3 kg)  10/01/16 185 lb 3 oz (84 kg)  09/16/16 230 lb (104.3 kg)     Physical Exam  Constitutional: He is oriented to person, place, and time. He appears well-developed and well-nourished. No distress.  HENT:  Head: Normocephalic and atraumatic.  Neck: No JVD present.  Cardiovascular: An irregularly irregular rhythm present. Tachycardia present.   No murmur heard. Pulmonary/Chest: He has no wheezes. He has no rales.  Abdominal: Soft. There is no tenderness.  Musculoskeletal: He exhibits no edema.  Neurological: He is alert and oriented to person, place, and time.  Skin: Skin is warm and dry.  Psychiatric: He has a normal mood and affect.    ASSESSMENT:    1. Atrial fibrillation with RVR ([place])   2. Chronic combined systolic and diastolic congestive heart failure   3. History of GI bleed   4. Hypertensive heart disease with CHF (congestive heart failure) ([place])   5. Hyperlipidemia, unspecified hyperlipidemia type   6. ESRD (end stage renal disease) ([place])   7. S/P bilateral BKA (below knee amputation) ([place])    PLAN:    In order of problems listed above:  1. Atrial fibrillation  with RVR ([place]) -  He presents to the office today for hospitalization follow-up after recent admission for blood loss anemia in the setting of GI blood loss requiring transfusion with PRBCs. His Coumadin was held. His metoprolol and diltiazem were both held as well due to hypotension. Coumadin was resumed at discharge. His INR today is 1.4. Today he is back in atrial fibrillation with RVR. He is hypotensive. Fortunately, he is asymptomatic. However, with his multiple comorbid conditions, he should be admitted to the hospital for rate control. He was also seen by Dr. Marlou Porch, who agreed. Given his multiple comorbid conditions, we will ask the hospitalist service to admit him and we will remain as consultants.  -  IV heparin per pharmacy  -  Coumadin per pharmacy  -  IV amiodarone for rate control  -  Admit to Tele to [facility] (I spoke with Dr. Marily Memos)  -  If rate difficult to control, consider TEE-DCCV  2. Chronic combined systolic and diastolic congestive heart failure - Volume is stable. Volume is managed by hemodialysis. He will need nephrology to follow him as well to continue his dialysis.  3. History of GI bleed - No recurrent evidence of bleeding.  4. Hypertensive heart disease with CHF (congestive heart failure) ([place] -  Blood pressure is low today. As noted, metoprolol and diltiazem were both held secondary to hypotension during his most recent hospitalization.  5. Hyperlipidemia, unspecified hyperlipidemia type -  Continue statin.  6. ESRD (end stage renal disease) ([place]) -  Tuesday, Thursday, Saturday dialysis.  7. S/P bilateral BKA (below knee amputation) ([place]) -  He is nonambulatory.  Signed, Richardson Dopp, PA-C  10/07/2016 10:18 AM    [facility] [place], [place], [place]  [address] Phone: [phone]; Fax: [phone]  Personally seen and examined. Agree with above.  66 year old male with end-stage renal disease on hemodialysis who was  recently hospitalized with GI bleed having his Coumadin held with combined systolic and diastolic heart failure here with EKG demonstrating atrial fibrillation with rapid ventricular response, heart rate in the 130 range . Other comorbidities include bilateral BKA's, wheelchair-bound patient.   - Plan is to admit to the hospital and provide further control of his atrial fibrillation. Unfortunately his blood pressure is quite low, 69F systolic which is often seen in the hemodialysis population. He reports no significant dizziness currently.  - We will place him on IV heparin because of his subtherapeutic INR  - We will go ahead and place him on IV amiodarone given his hypotension for potential rate control. Previously he had taken metoprolol and diltiazem with success but we do not have blood pressure to administer at this time.  Currently he is alert and oriented in no acute distress, heart is irregularly irregular and tachycardic, no significant JVD, lungs are clear to auscultation, bilateral BKA's noted.  We will follow along closely with hospitalist service. Appreciate their oversight of care.  Candee Furbish, MD

## 2016-10-07 NOTE — H&P (Signed)
History and Physical    Oscar Castillo MCN:[id number] DOB: 1951-07-11 DOA: 10/07/2016  PCP: [name], MD   Patient coming from: medical office  Chief Complaint: Afib with RVR  HPI: Oscar Castillo is a 66 y.o. male with medical history significant chronic combined systolic and diastolic CHF, CAD with previous history MI, HTN, HLE, recent GI bleeding last month with blood loss anemia requiring admission and blood transfusion earlier this month, ESRD, DM type II, bilateral BKA who was seen today at his cardiology and today and was found to be in Afib with RVR. Patient has a h/o Afib and was on Coumadin anticoagulation therapy that was discontinued d/t GI bleeding and blood loss anemia, but prior to discharge from the hospital it was restarted without bridging with Lovenox as his capsule endoscopy reportedly was negative.  Today he was brought to the hospital as a direct admission d/t hypotension in setting of rapid Afib requiring Amiodarone drip Patient denied chest pain, SOB, orthopnea, palpitations, dizziness, nausea, diaphoresis  Review of Systems: As per HPI otherwise 10 point review of systems negative.   Ambulatory Status: uses prosthesis for transfer and wheel chair for mobility   Past Medical History:  Diagnosis Date  . Anemia   . Antral ulcer 2014   small  . BACK PAIN, LUMBAR, CHRONIC   . BENIGN PROSTATIC HYPERTROPHY   . CEREBROVASCULAR ACCIDENT, HX OF   . CHOLELITHIASIS   . Chronic combined systolic and diastolic CHF (congestive heart failure) ([place])   . Complication of anesthesia    wife states pt had trouble waking up with his last surgery in Nov., 2014  . DEPRESSION   . DIABETES MELLITUS, TYPE II   . ERECTILE DYSFUNCTION   . ESRD on hemodialysis ([place])    ESRD due to DM/HTN. Started dialysis in November 2013.  HD TTS at [facility] on [address].  [name] GERD   . Hemorrhoids   . HEPATITIS C, HX OF   . History of Clostridium difficile   . HYPERTENSION   . LBBB (left  bundle branch block)   . Morbid obesity ([place])   . NEPHROLITHIASIS, HX OF   . PAF (paroxysmal atrial fibrillation) ([place])    a. Dx 12/2015.  . S/P bilateral BKA (below knee amputation) [facility])     Past Surgical History:  Procedure Laterality Date  . AMPUTATION Left 05/12/2013   Procedure: AMPUTATION RAY;  Surgeon: Newt Minion, MD;  Location: [place];  Service: Orthopedics;  Laterality: Left;  Left Foot 1st Ray Amputation  . AMPUTATION Left 06/09/2013   Procedure: AMPUTATION BELOW KNEE;  Surgeon: Newt Minion, MD;  Location: [place];  Service: Orthopedics;  Laterality: Left;  Left Below Knee Amputation and removal proximal screws IM tibial nail  . AMPUTATION Right 09/08/2013   Procedure: AMPUTATION BELOW KNEE;  Surgeon: Newt Minion, MD;  Location: [place];  Service: Orthopedics;  Laterality: Right;  Right Below Knee Amputation  . AMPUTATION Right 10/11/2013   Procedure: AMPUTATION BELOW KNEE;  Surgeon: Newt Minion, MD;  Location: [place];  Service: Orthopedics;  Laterality: Right;  Right Below Knee Amputation Revision  . AV FISTULA PLACEMENT  06/14/2012   Procedure: ARTERIOVENOUS (AV) FISTULA CREATION;  Surgeon: Angelia Mould, MD;  Location: [facility] OR;  Service: Vascular;  Laterality: Left;  Left basilic vein transposition with fistula.  . COLONOSCOPY N/A 10/28/2012   Procedure: COLONOSCOPY;  Surgeon: Jeryl Columbia, MD;  Location: [facility] ENDOSCOPY;  Service: Endoscopy;  Laterality: N/A;  . COLONOSCOPY N/A 11/02/2012   Procedure: COLONOSCOPY;  Surgeon: Cleotis Nipper, MD;  Location: [facility] ENDOSCOPY;  Service: Endoscopy;  Laterality: N/A;  . COLONOSCOPY N/A 11/03/2012   Procedure: COLONOSCOPY;  Surgeon: Cleotis Nipper, MD;  Location: [facility] ENDOSCOPY;  Service: Endoscopy;  Laterality: N/A;  . COLONOSCOPY N/A 09/16/2016   Procedure: COLONOSCOPY;  Surgeon: Jerene Bears, MD;  Location: [facility] ENDOSCOPY;  Service: Gastroenterology;  Laterality: N/A;  . ENTEROSCOPY N/A 11/08/2012   Procedure: ENTEROSCOPY;  Surgeon:  Wonda Horner, MD;  Location: [facility] ENDOSCOPY;  Service: Endoscopy;  Laterality: N/A;  . ESOPHAGOGASTRODUODENOSCOPY N/A 11/02/2012   Procedure: ESOPHAGOGASTRODUODENOSCOPY (EGD);  Surgeon: Cleotis Nipper, MD;  Location: [facility] ENDOSCOPY;  Service: Endoscopy;  Laterality: N/A;  . ESOPHAGOGASTRODUODENOSCOPY (EGD) WITH PROPOFOL N/A 09/16/2016   Procedure: ESOPHAGOGASTRODUODENOSCOPY (EGD) WITH PROPOFOL;  Surgeon: Jerene Bears, MD;  Location: [facility] ENDOSCOPY;  Service: Gastroenterology;  Laterality: N/A;  . EYE SURGERY Left    to remove scar tissue  . GIVENS CAPSULE STUDY N/A 11/04/2012   Procedure: GIVENS CAPSULE STUDY;  Surgeon: Cleotis Nipper, MD;  Location: [facility] ENDOSCOPY;  Service: Endoscopy;  Laterality: N/A;  . GIVENS CAPSULE STUDY N/A 09/29/2016   Procedure: GIVENS CAPSULE STUDY;  Surgeon: Manus Gunning, MD;  Location: [place];  Service: Gastroenterology;  Laterality: N/A;  try to keep pt up in bedside chair as much as possible during the study.    [name] HARDWARE REMOVAL Left 06/09/2013   Procedure: HARDWARE REMOVAL;  Surgeon: Newt Minion, MD;  Location: [place];  Service: Orthopedics;  Laterality: Left;  Left Below Knee Amputation  and Removal proximal screws IM tibial nail  . IR GENERIC HISTORICAL  09/27/2016   IR US GUIDE VASC ACCESS RIGHT 09/27/2016 Aletta Edouard, MD [facility]INTER[facility]  . IR GENERIC HISTORICAL  09/27/2016   IR FLUORO GUIDE CV LINE RIGHT 09/27/2016 Aletta Edouard, MD [facility]  . NEPHRECTOMY     partial RR  . ORIF FIBULA FRACTURE Left 09/09/2012   Procedure: OPEN REDUCTION INTERNAL FIXATION (ORIF) FIBULA FRACTURE;  Surgeon: Johnny Bridge, MD;  Location: [place];  Service: Orthopedics;  Laterality: Left;  . TIBIA IM NAIL INSERTION Left 09/09/2012   Procedure: INTRAMEDULLARY (IM) NAIL TIBIAL;  Surgeon: Johnny Bridge, MD;  Location: [place];  Service: Orthopedics;  Laterality: Left;  left tibial nail and open reduction internal fixation left fibula fracture    Social History   Social  History  . Marital status: Married    Spouse name: N/A  . Number of children: 2  . Years of education: 16   Occupational History  . disabled due to stroke Retired   Social History Main Topics  . Smoking status: Never Smoker  . Smokeless tobacco: Never Used  . Alcohol use No  . Drug use: No  . Sexual activity: Yes    Birth control/ protection: None   Other Topics Concern  . 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 Diabetes Mother   . Hypertension Mother   . Heart attack Father   . Hypertension Father   . Coronary artery disease Other     Prior to Admission medications   Medication Sig Start Date End Date Taking? Authorizing Provider  acetaminophen (TYLENOL) 500 MG tablet Take  500 mg by mouth every 6 (six) hours as needed for mild pain.    Historical Provider, MD  atorvastatin (LIPITOR) 40 MG tablet TAKE 1 TABLET(40 MG) BY MOUTH DAILY AT 6 PM 08/10/16   [name], MD  calcium acetate (PHOSLO) 667 MG capsule Take 2,001 mg by mouth 3 (three) times daily with meals.    Historical Provider, MD  cyclobenzaprine (FLEXERIL) 5 MG tablet Take 1 tablet (5 mg total) by mouth 3 (three) times daily as needed for muscle spasms. 07/08/16   [name], MD  feeding supplement (BOOST / RESOURCE BREEZE) LIQD Take 1 Container by mouth 2 (two) times daily between meals. 10/02/16   Maryann Mikhail, DO  HYDROcodone-acetaminophen (NORCO/VICODIN) 5-325 MG tablet Take 1 tablet by mouth every 6 (six) hours as needed for moderate pain.    Historical Provider, MD  hydrOXYzine (ATARAX/VISTARIL) 25 MG tablet Take 25 mg by mouth every 6 (six) hours as needed for anxiety.    Historical Provider, MD  insulin aspart (NOVOLOG) 100 UNIT/ML injection Inject 0-9 Units into the skin 3 (three) times daily with meals. 10/02/16   Maryann Mikhail, DO  ketoconazole  (NIZORAL) 2 % cream Apply 1 application topically daily as needed for irritation. 09/16/16   Barton Dubois, MD  latanoprost (XALATAN) 0.005 % ophthalmic solution Place 1 drop into both eyes at bedtime.  06/12/16   Historical Provider, MD  lidocaine-prilocaine (EMLA) cream APPLY A SMALL AMOUNT TO SKIN AT THE ACCESS SITE (AVF) AS DIRECTED BEFORE EACH DIALYSIS SESSION THREE DAYS A WEEK 06/03/15   Historical Provider, MD  ondansetron (ZOFRAN ODT) 4 MG disintegrating tablet 4mg  ODT q4 hours prn nausea/vomit 06/16/16   Milton Ferguson, MD  pantoprazole (PROTONIX) 40 MG tablet Take 1 tablet (40 mg total) by mouth 2 (two) times daily before a meal. 09/16/16   Barton Dubois, MD  polyethylene glycol (MIRALAX / Floria Raveling) packet Take 17 g by mouth 2 (two) times daily. 09/16/16   Barton Dubois, MD  QUEtiapine (SEROQUEL) 25 MG tablet Take 1 tablet (25 mg total) by mouth 2 (two) times daily. 04/07/16   [name], MD  SENSIPAR 30 MG tablet Take 30 mg by mouth every evening.  01/09/15   Historical Provider, MD  silver sulfADIAZINE (SILVADENE) 1 % cream Apply 1 application topically daily as needed (irratation). 09/16/16   Barton Dubois, MD  traMADol (ULTRAM) 50 MG tablet Take 50 mg by mouth every 8 (eight) hours as needed for moderate pain or severe pain.    Historical Provider, MD  triamcinolone cream (KENALOG) 0.1 % Apply 1 application topically as needed (irritation).  07/17/16   Historical Provider, MD  VOLTAREN 1 % GEL APPLY 2 GRAMS TO HANDS BID TO TID AS NEEDED FOR PAIN. 09/19/15   Historical Provider, MD  warfarin (COUMADIN) 2.5 MG tablet Take 2.5mg  nightly, check INR on 10/05/2016 10/02/16   Cristal Ford, DO    Physical Exam: Vitals:   10/07/16 1104  BP: 125/65  Resp: 18  Temp: 98.7 F (37.1 C)  TempSrc: Oral  SpO2: 99%  Weight: 86.9 kg (191 lb 8 oz)  Height: 5\' 10"  (1.778 m)     General: Appears calm and comfortable Eyes: PERRLA, EOMI, normal lids, iris ENT:  grossly normal hearing, lips & tongue,  mucous membranes moist and intact Neck: no lymphoadenopathy, masses or thyromegaly Cardiovascular: irregularly irregular, no m/r/g. No JVD, carotid bruits.   Respiratory: bilateral no wheezes, rales, rhonchi or cracles. Normal respiratory effort. No accessory muscle use  observed Abdomen: soft, non-tender, non-distended, no organomegaly or masses appreciated. BS present in all quadrants Skin: no rash, ulcers or induration seen on limited exam Musculoskeletal: bilateral LE, ampitation, upper extremities with good ROM, no bony abnormality or joint deformities observed Psychiatric: depressed mood and flat affect, speech fluent and appropriate, alert and oriented x3 Neurologic: CN II-XII grossly intact, moves all extremities in coordinated fashion, sensation intact  Labs on Admission: I have personally reviewed following labs and imaging studies  CBC, BMP  GFR: Estimated Creatinine Clearance: 11.9 mL/min (by C-G formula based on SCr of 6.41 mg/dL (H)).   Creatinine Clearance: Estimated Creatinine Clearance: 11.9 mL/min (by C-G formula based on SCr of 6.41 mg/dL (H)).    Radiological Exams on Admission: No results found.  EKG: Independently reviewed - Afib with RVR  Assessment/Plan Principal Problem:   Atrial fibrillation with RVR (HCC) Active Problems:   Depression   GERD   Hyperlipidemia   DM (diabetes mellitus) type I controlled with renal manifestation (HCC)   ESRD on hemodialysis (HCC)   Hypotension   Afib with RVR on presentation - now better controlled Patient is on Amiodarone managed by Cardiology Has high CHADS2 VASC  - 7 , on Coumadin with INR 1.4, that is managed by pharmacy  Hypotension Could be associated with tachyarrhythmia and hopefully will improve with rate control Defer to cardiology  ESRD  - on HD Will notify nephrology about patient's admission  DM type II - most recent HgbA1C is6.4 on 09/15/16 Hold Metformin while hospitalized Continue diabetic diet,  monitor FSBS QACHS, add sliding scale insulin  Hyperlipidemia Continue statin therapy  Anemia with h/o recent GI bleed Hgb is stable, continue to monitor  DVT prophylaxis: Heparin/coumadin Code Status: Full Family Communication: none Disposition Plan: telemetry Consults called: nephrology Admission status: inpatient   York Grice, [place] Pager: [phone] [facility]  If 7PM-7AM, please contact night-coverage [email or web address] Password [facility]  10/07/2016, 11:49 AM

## 2016-10-07 NOTE — Progress Notes (Signed)
Patient ID: ZAKARIE STURDIVANT, male   DOB: Dec 06, 1950, 66 y.o.   MRN: [id number]  [facility] Progress Note    Subjective:   Mr. Stocks was just discharged from [facility] on 10/02/16 (see consult dated 09/26/16) for GI bleed related to gastritis and had been doing well after discharge.  He did undergo declot of his LAVF 10/06/16 and was seen in Cardiology office for follow up when he was noted to be back in A fib with RVR of 135.  He also had hypotension with BP of 82/58.  He was admitted from the clinic today for further evaluation and management of his A fib with RVR.  We were consulted to provide HD and attend to his ESRD-related medical conditions.  Mr. Brock was completely asymptomatic with the A fib/RVR and low BP.  No complaints at this time. He was started on IV amiodarone and his HR is now in the 80's.   Objective:   BP 125/65 (BP Location: Right Wrist)   Temp 98.7 F (37.1 C) (Oral)   Resp 18   Ht 5\' 10"  (1.778 m)   Wt 86.9 kg (191 lb 8 oz)   SpO2 99%   BMI 27.48 kg/m   Intake/Output: No intake/output data recorded.   Intake/Output this shift:  No intake/output data recorded. Weight change:   Physical Exam: Gen:WD AAM in NAD NWG:NFAOZHY rate, no rub Resp: cta QMV:HQIONG Ext: no edema, s/p bilateral BKA's, LUE AVF +T/B  Labs: BMET  Recent Labs Lab 10/01/16 0419 10/02/16 0751 10/07/16 1256  NA 133* 134* 139  K 3.4* 3.8 3.7  CL 94* 96* 99*  CO2 29 28 26   GLUCOSE 128* 131* 152*  BUN 29* 19 71*  CREATININE 8.10* 6.41* 12.64*  ALBUMIN  --   --  3.2*  CALCIUM 8.3* 8.3* 8.9   CBC  Recent Labs Lab 10/01/16 0419 10/02/16 0751 10/07/16 1124  WBC 5.6 7.0 7.1  HGB 7.4* 10.4* 10.0*  HCT 23.0* 32.1* 30.3*  MCV 87.8 87.7 89.4  PLT 234 209 204    @IMGRELPRIORS @ Medications:    . amiodarone  150 mg Intravenous Once  . insulin aspart  0-5 Units Subcutaneous QHS  . insulin aspart  0-9 Units Subcutaneous TID WC  . warfarin  5 mg Oral ONCE-1800  . Warfarin -  Pharmacist Dosing Inpatient   Does not apply q1800   [facility] 3[facility] 76min  84kg  2/2.25 bath  Hep 3400  LUA AVF  - no meds   Assessment/ Plan:   1. A fib with RVR- on coumadin and started on IV amiodarone. 2. GI bleed due to gastritis, discharged 10/02/16 3. ESRD continue with HD qTTS, plan for HD tomorrow 4. Vascular access- s/p PTA of LUE AVF 10/06/16, +T/B 5. Anemia: on ESA and follow H/H  6. CKD-MBD: continue with binders 7. Nutrition: renal diet 8. Hypertension: stable  Donetta Potts, MD [place] Pager [phone] 10/07/2016, 3:34 PM

## 2016-10-07 NOTE — Progress Notes (Signed)
ANTICOAGULATION CONSULT NOTE - Initial Consult  Pharmacy Consult for heparin>>warfarin Indication: atrial fibrillation  No Known Allergies  Patient Measurements:   Heparin Dosing Weight: 92kg  Vital Signs: BP: 82/58 (03/14 0843) Pulse Rate: 135 (03/14 0843)  Labs:  Recent Labs  10/07/16 0823  INR 1.4    Estimated Creatinine Clearance: 13.3 mL/min (by C-G formula based on SCr of 6.41 mg/dL (H)).   Medical History: Past Medical History:  Diagnosis Date  . Anemia   . Antral ulcer 2014   small  . BACK PAIN, LUMBAR, CHRONIC   . BENIGN PROSTATIC HYPERTROPHY   . CEREBROVASCULAR ACCIDENT, HX OF   . CHOLELITHIASIS   . Chronic combined systolic and diastolic CHF (congestive heart failure) ([place])   . Complication of anesthesia    wife states pt had trouble waking up with his last surgery in Nov., 2014  . DEPRESSION   . DIABETES MELLITUS, TYPE II   . ERECTILE DYSFUNCTION   . ESRD on hemodialysis ([place])    ESRD due to DM/HTN. Started dialysis in November 2013.  HD TTS at [facility] on [address].  [name] GERD   . Hemorrhoids   . HEPATITIS C, HX OF   . History of Clostridium difficile   . HYPERTENSION   . LBBB (left bundle branch block)   . Morbid obesity ([place])   . NEPHROLITHIASIS, HX OF   . PAF (paroxysmal atrial fibrillation) ([place])    a. Dx 12/2015.  . S/P bilateral BKA (below knee amputation) (HCC)    Assessment: a 66 y.o. male with a hx of ESRD on hemodialysis, chronic combined systolic and diastolic CHF, prior stroke, diabetes, HTN, hepatitis C, paroxysmal AF, status post bilateral BKA's, LBBB.   Patient is back in afib with rvr this am and symptomatic with hypotension to be admitted to hospital.   INR this am is 1.4, orders received to start IV heparin/warfarin bridge. Patient being started on IV amiodarone so will need to watch INR closely.   PTA home dose 3.75mg  daily except 2.5mg  on sundays  Goal of Therapy:  INR 2-3 Heparin level 0.3-0.7 units/ml Monitor  platelets by anticoagulation protocol: Yes   Plan:  Heparin 4000 unit bolus then start drip rate at 1200 units/hr Warfarin 5mg  tonight Daily INR/CBC/heparin level  Erin Hearing PharmD., BCPS Clinical Pharmacist Pager [phone] 10/07/2016 10:43 AM

## 2016-10-07 NOTE — Patient Instructions (Signed)
You are being admitted to [facility] to [address].

## 2016-10-08 DIAGNOSIS — I4891 Unspecified atrial fibrillation: Secondary | ICD-10-CM

## 2016-10-08 DIAGNOSIS — Z992 Dependence on renal dialysis: Secondary | ICD-10-CM

## 2016-10-08 DIAGNOSIS — N186 End stage renal disease: Secondary | ICD-10-CM

## 2016-10-08 LAB — BASIC METABOLIC PANEL
Anion gap: 13 (ref 5–15)
BUN: 80 mg/dL — AB (ref 6–20)
CHLORIDE: 98 mmol/L — AB (ref 101–111)
CO2: 23 mmol/L (ref 22–32)
Calcium: 8.5 mg/dL — ABNORMAL LOW (ref 8.9–10.3)
Creatinine, Ser: 13.79 mg/dL — ABNORMAL HIGH (ref 0.61–1.24)
GFR calc Af Amer: 4 mL/min — ABNORMAL LOW (ref >60)
GFR calc non Af Amer: 3 mL/min — ABNORMAL LOW (ref >60)
Glucose, Bld: 127 mg/dL — ABNORMAL HIGH (ref 65–99)
POTASSIUM: 3.9 mmol/L (ref 3.5–5.1)
SODIUM: 134 mmol/L — AB (ref 135–145)

## 2016-10-08 LAB — CBC
HCT: 28.4 % — ABNORMAL LOW (ref 39.0–52.0)
HEMOGLOBIN: 9.4 g/dL — AB (ref 13.0–17.0)
MCH: 29.5 pg (ref 26.0–34.0)
MCHC: 33.1 g/dL (ref 30.0–36.0)
MCV: 89 fL (ref 78.0–100.0)
Platelets: 199 10*3/uL (ref 150–400)
RBC: 3.19 MIL/uL — ABNORMAL LOW (ref 4.22–5.81)
RDW: 17.5 % — ABNORMAL HIGH (ref 11.5–15.5)
WBC: 7.3 10*3/uL (ref 4.0–10.5)

## 2016-10-08 LAB — GLUCOSE, CAPILLARY
Glucose-Capillary: 107 mg/dL — ABNORMAL HIGH (ref 65–99)
Glucose-Capillary: 134 mg/dL — ABNORMAL HIGH (ref 65–99)
Glucose-Capillary: 124 mg/dL — ABNORMAL HIGH (ref 65–99)
Glucose-Capillary: 163 mg/dL — ABNORMAL HIGH (ref 65–99)

## 2016-10-08 LAB — PROTIME-INR
INR: 1.51
Prothrombin Time: 18.4 seconds — ABNORMAL HIGH (ref 11.4–15.2)

## 2016-10-08 LAB — HEPARIN LEVEL (UNFRACTIONATED)
HEPARIN UNFRACTIONATED: 0.72 [IU]/mL — AB (ref 0.30–0.70)
Heparin Unfractionated: 0.74 IU/mL — ABNORMAL HIGH (ref 0.30–0.70)

## 2016-10-08 LAB — TSH: TSH: 0.311 u[IU]/mL — ABNORMAL LOW (ref 0.350–4.500)

## 2016-10-08 MED ORDER — SODIUM CHLORIDE 0.9 % IV SOLN: 100.0000 mL | INTRAVENOUS | Status: DC | PRN

## 2016-10-08 MED ORDER — AMIODARONE HCL 200 MG PO TABS
400.0000 mg | ORAL_TABLET | Freq: Two times a day (BID) | ORAL | Status: DC
  Administered 2016-10-08 – 2016-10-09 (×2): 400 mg via ORAL
  Filled 2016-10-08 (×2): qty 2

## 2016-10-08 MED ORDER — PENTAFLUOROPROP-TETRAFLUOROETH EX AERO: 1.0000 "application " | INHALATION_SPRAY | CUTANEOUS | Status: DC | PRN

## 2016-10-08 MED ORDER — ALTEPLASE 2 MG IJ SOLR: 2.0000 mg | Freq: Once | INTRAMUSCULAR | Status: DC | PRN

## 2016-10-08 MED ORDER — AMIODARONE HCL 200 MG PO TABS: 200.0000 mg | ORAL_TABLET | Freq: Every day | ORAL | Status: DC

## 2016-10-08 MED ORDER — LIDOCAINE HCL (PF) 1 % IJ SOLN: 5.0000 mL | INTRAMUSCULAR | Status: DC | PRN

## 2016-10-08 MED ORDER — AMIODARONE HCL 200 MG PO TABS: 400.0000 mg | ORAL_TABLET | Freq: Every day | ORAL | Status: DC

## 2016-10-08 MED ORDER — WARFARIN SODIUM 5 MG PO TABS
5.0000 mg | ORAL_TABLET | Freq: Once | ORAL | Status: AC
  Administered 2016-10-08: 5 mg via ORAL
  Filled 2016-10-08: qty 1

## 2016-10-08 MED ORDER — LIDOCAINE-PRILOCAINE 2.5-2.5 % EX CREA
1.0000 "application " | TOPICAL_CREAM | CUTANEOUS | Status: DC | PRN
  Filled 2016-10-08: qty 5

## 2016-10-08 MED ORDER — HEPARIN SODIUM (PORCINE) 1000 UNIT/ML DIALYSIS
3400.0000 [IU] | INTRAMUSCULAR | Status: DC | PRN
  Filled 2016-10-08: qty 4

## 2016-10-08 MED ORDER — PANTOPRAZOLE SODIUM 40 MG PO TBEC
40.0000 mg | DELAYED_RELEASE_TABLET | Freq: Every day | ORAL | Status: DC
  Administered 2016-10-08 – 2016-10-09 (×2): 40 mg via ORAL
  Filled 2016-10-08 (×2): qty 1

## 2016-10-08 MED ORDER — HEPARIN SODIUM (PORCINE) 1000 UNIT/ML DIALYSIS
1000.0000 [IU] | INTRAMUSCULAR | Status: DC | PRN
  Filled 2016-10-08: qty 1

## 2016-10-08 MED ORDER — AMIODARONE HCL 200 MG PO TABS: 400.0000 mg | ORAL_TABLET | Freq: Two times a day (BID) | ORAL | Status: DC

## 2016-10-08 NOTE — Progress Notes (Signed)
ANTICOAGULATION CONSULT NOTE - Follow Up Consult  Pharmacy Consult for Heparin and Coumadin Indication: atrial fibrillation  No Known Allergies  Patient Measurements: Height: 5\' 10"  (177.8 cm) Weight: 195 lb 15.8 oz (88.9 kg) IBW/kg (Calculated) : 73 Heparin Dosing Weight: 89 kg  Vital Signs: Temp: 98.6 F (37 C) (03/15 1405) Temp Source: Oral (03/15 1405) BP: 142/83 (03/15 1500) Pulse Rate: 73 (03/15 1500)  Labs:  Recent Labs  10/07/16 0823 10/07/16 1124 10/07/16 1256 10/07/16 2048 10/08/16 0300 10/08/16 1114  HGB  --  10.0*  --   --  9.4*  --   HCT  --  30.3*  --   --  28.4*  --   PLT  --  204  --   --  199  --   LABPROT  --   --   --   --  18.4*  --   INR 1.4  --   --   --  1.51  --   HEPARINUNFRC  --   --   --  0.68 0.74* 0.72*  CREATININE  --   --  12.64*  --  13.79*  --     Estimated Creatinine Clearance: 6 mL/min (A) (by C-G formula based on SCr of 13.79 mg/dL (H)).  Assessment:  66 yr old male continues on Heparin and Coumadin for afib.  Converted on Amiodarone drip, changing to PO amiodarone today.     Recent admission (3/3-10/02/16) with GI bleed, no source identified.  Patient reports no further bleeding since discharged 10/02/16. On Protonix 40 mg daily.    Heparin level is just above target range (0.72) on 1050 units/hr.  Little change after decrease from 1200 units/hr early this am.   INR 1.51.  Coumadin dose not charted last night.    Home Coumadin regimen: 3.75 mg daily except 2.5 mg on Sundays.  Goal of Therapy:  INR 2-3 Heparin level 0.3-0.7 units/ml Monitor platelets by anticoagulation protocol: Yes   Plan:   Decrease heparin drip to 950 units/hr.  Coumadin 5 mg today.  Scheduled for 8pm, after he's back on the floor after dialysis.  Heparin level ~8 hrs after rate change.  Daily heparin level, PT/INR and CBC.  Monitor for any bleeding.  Expect Coumadin requirement to change since Amiodarone added.  Arty Baumgartner, [place] Pager:  [phone] 10/08/2016,3:47 PM

## 2016-10-08 NOTE — Progress Notes (Signed)
PT Cancellation Note  Patient Details Name: MUREL SHENBERGER MRN: [id number] DOB: [birth year]   Cancelled Treatment:    Reason Eval/Treat Not Completed: Patient at procedure or test/unavailable. Pt currently at HD. PT will continue to f/u with pt as available.    [place] 10/08/2016, 2:15 PM

## 2016-10-08 NOTE — Progress Notes (Signed)
ANTICOAGULATION CONSULT NOTE - Follow Up Consult  Pharmacy Consult for Heparin  Indication: atrial fibrillation  No Known Allergies  Patient Measurements: Height: 5\' 10"  (177.8 cm) Weight: 191 lb 8 oz (86.9 kg) IBW/kg (Calculated) : 73  Vital Signs:    Labs:  Recent Labs  10/07/16 0823 10/07/16 1124 10/07/16 1256 10/07/16 2048 10/08/16 0300  HGB  --  10.0*  --   --  9.4*  HCT  --  30.3*  --   --  28.4*  PLT  --  204  --   --  199  INR 1.4  --   --   --   --   HEPARINUNFRC  --   --   --  0.68 0.74*  CREATININE  --   --  12.64*  --  13.79*    Estimated Creatinine Clearance: 5.5 mL/min (A) (by C-G formula based on SCr of 13.79 mg/dL (H)).   Assessment: Heparin for afib, heparin level is high at 0.74, no issues per RN.   Goal of Therapy:  Heparin level 0.3-0.7 units/ml Monitor platelets by anticoagulation protocol: Yes   Plan:  -Dec heparin to 1050 units/hr -1200 HL  Narda Bonds 10/08/2016,4:11 AM

## 2016-10-08 NOTE — Progress Notes (Signed)
PROGRESS NOTE        PATIENT DETAILS Name: Oscar Castillo Age: 66 y.o. Sex: male Date of Birth: August 13, 1950 Admit Date: 10/07/2016 Admitting Physician Waldemar Dickens, MD WNU:[id number] [name], MD  Brief Narrative: Patient is a 66 y.o. male with past medical history of recent GI bleeding with negative workup, chronic systolic heart failure (in past EF 35%, now 50-55%), bilateral BKA, ESRD on HD-admitted for A. fib with RVR. Started on amiodarone infusion, has converted back to sinus rhythm this morning. See below for further details  Subjective: Lying comfortably in bed-no chest pain or shortness of breath.  Assessment/Plan: Atrial fibrillation with RVR: Back in sinus rhythm, cardiology has switched from IV to oral amiodarone. Continue Coumadin per pharmacy-INR appears to be still subtherapeutic-currently overlapping heparin and Coumadin.  Chronic combined systolic and diastolic congestive heart failure: Compensated, managed with volume removal during hemodialysis. Since BP soft, not on a beta blocker or ACEI/ARB   ESRD: HD on TTS, nephrology following.  History of recent GI bleeding: Underwent intensive workup including a EGD, colonoscopy and a capsule endoscopy which were all negative during his most recent admission (3/3-3/9). No overt bleeding evident at this time.  Anemia: Multifactorial-probably from recent GI bleeding and anemia of chronic kidney disease. Hemoglobin remained stable.  DM-2: CBGs stable on SSI.  History of bilateral BKA  Physical deconditioning: Suspect multifactorial etiology-suspect has a very poor/minimal ambulation at baseline-probably worsened due to recent acute illnesses. Obtain PT evaluation prior to discharge-suspect will require home health services on discharge  DVT Prophylaxis: Full dose anticoagulation with Heparin/Coumadin  Code Status: Full code  Family Communication: Spouse at bedside  Disposition Plan: Remain  inpatient-but will plan on Home health-likely d/c on 3/16  Antimicrobial agents: Anti-infectives    None      Procedures: None  CONSULTS:  cardiology and nephrology  Time spent: 25- minutes-Greater than 50% of this time was spent in counseling, explanation of diagnosis, planning of further management, and coordination of care.  MEDICATIONS: Scheduled Meds: . amiodarone  150 mg Intravenous Once  . insulin aspart  0-5 Units Subcutaneous QHS  . insulin aspart  0-9 Units Subcutaneous TID WC  . warfarin  5 mg Oral ONCE-1800  . Warfarin - Pharmacist Dosing Inpatient   Does not apply q1800   Continuous Infusions: . amiodarone 30 mg/hr (10/08/16 0823)  . heparin 1,050 Units/hr (10/08/16 0413)   PRN Meds:.   PHYSICAL EXAM: Vital signs: Vitals:   10/07/16 1104 10/08/16 0413  BP: 125/65 106/65  Pulse:  66  Resp: 18 14  Temp: 98.7 F (37.1 C) 99.1 F (37.3 C)  TempSrc: Oral Oral  SpO2: 99% 99%  Weight: 86.9 kg (191 lb 8 oz) 88.9 kg (195 lb 14.4 oz)  Height: 5\' 10"  (1.778 m)    Filed Weights   10/07/16 1104 10/08/16 0413  Weight: 86.9 kg (191 lb 8 oz) 88.9 kg (195 lb 14.4 oz)   Body mass index is 28.11 kg/m.   General appearance :Awake, alert, not in any distress. Speech Clear.  Eyes:, pupils equally reactive to light and accomodation,no scleral icterus. HEENT: Atraumatic and Normocephalic Neck: supple, no JVD. No cervical lymphadenopathy.  Resp:Good air entry bilaterally, no added sounds  CVS: S1 S2 regular GI: Bowel sounds present, Non tender and not distended with no gaurding, rigidity or rebound. Extremities: B/L BKA Neurology:  speech clear,Non focal, sensation is grossly intact.  I have personally reviewed following labs and imaging studies  LABORATORY DATA: CBC:  Recent Labs Lab 10/02/16 0751 10/07/16 1124 10/08/16 0300  WBC 7.0 7.1 7.3  HGB 10.4* 10.0* 9.4*  HCT 32.1* 30.3* 28.4*  MCV 87.7 89.4 89.0  PLT 209 204 924    Basic Metabolic  Panel:  Recent Labs Lab 10/02/16 0751 10/07/16 1256 10/08/16 0300  NA 134* 139 134*  K 3.8 3.7 3.9  CL 96* 99* 98*  CO2 28 26 23   GLUCOSE 131* 152* 127*  BUN 19 71* 80*  CREATININE 6.41* 12.64* 13.79*  CALCIUM 8.3* 8.9 8.5*  MG  --  2.3  --     GFR: Estimated Creatinine Clearance: 6 mL/min (A) (by C-G formula based on SCr of 13.79 mg/dL (H)).  Liver Function Tests:  Recent Labs Lab 10/07/16 1256  AST 10*  ALT 8*  ALKPHOS 43  BILITOT 0.5  PROT 7.0  ALBUMIN 3.2*   No results for input(s): LIPASE, AMYLASE in the last 168 hours. No results for input(s): AMMONIA in the last 168 hours.  Coagulation Profile:  Recent Labs Lab 10/07/16 0823 10/08/16 0300  INR 1.4 1.51    Cardiac Enzymes: No results for input(s): CKTOTAL, CKMB, CKMBINDEX, TROPONINI in the last 168 hours.  BNP (last 3 results) No results for input(s): PROBNP in the last 8760 hours.  HbA1C: No results for input(s): HGBA1C in the last 72 hours.  CBG:  Recent Labs Lab 10/02/16 1122 10/07/16 1723 10/07/16 2211 10/08/16 0740 10/08/16 1103  GLUCAP 147* 146* 177* 107* 134*    Lipid Profile: No results for input(s): CHOL, HDL, LDLCALC, TRIG, CHOLHDL, LDLDIRECT in the last 72 hours.  Thyroid Function Tests: No results for input(s): TSH, T4TOTAL, FREET4, T3FREE, THYROIDAB in the last 72 hours.  Anemia Panel: No results for input(s): VITAMINB12, FOLATE, FERRITIN, TIBC, IRON, RETICCTPCT in the last 72 hours.  Urine analysis:    Component Value Date/Time   COLORURINE YELLOW 05/15/2013 0007   APPEARANCEUR CLEAR 05/15/2013 0007   LABSPEC 1.014 05/15/2013 0007   PHURINE 8.5 (H) 05/15/2013 0007   GLUCOSEU 250 (A) 05/15/2013 0007   GLUCOSEU 500 (?) 11/06/2009 0948   HGBUR NEGATIVE 05/15/2013 0007   BILIRUBINUR NEGATIVE 05/15/2013 0007   KETONESUR NEGATIVE 05/15/2013 0007   PROTEINUR >300 (A) 05/15/2013 0007   UROBILINOGEN 0.2 05/15/2013 0007   NITRITE NEGATIVE 05/15/2013 0007    LEUKOCYTESUR NEGATIVE 05/15/2013 0007    Sepsis Labs: Lactic Acid, Venous    Component Value Date/Time   LATICACIDVEN 2.10 10/01/2013 1136    MICROBIOLOGY: Recent Results (from the past 240 hour(s))  MRSA PCR Screening     Status: None   Collection Time: 10/07/16  4:44 PM  Result Value Ref Range Status   MRSA by PCR NEGATIVE NEGATIVE Final    Comment:        The GeneXpert MRSA Assay (FDA approved for NASAL specimens only), is one component of a comprehensive MRSA colonization surveillance program. It is not intended to diagnose MRSA infection nor to guide or monitor treatment for MRSA infections.     RADIOLOGY STUDIES/RESULTS: Ir Fluoro Guide Cv Line Right  Result Date: 09/27/2016 CLINICAL DATA:  Poor intravenous access and need for central line. EXAM: NON-TUNNELED CENTRAL VENOUS CATHETER PLACEMENT WITH ULTRASOUND AND FLUOROSCOPIC GUIDANCE FLUOROSCOPY TIME:  18 seconds.  1.0 mGy. PROCEDURE: The procedure, risks, benefits, and alternatives were explained to the patient's wife. Questions regarding the procedure were encouraged  and answered. The patient's wife understands and consents to the procedure. The right neck was prepped with chlorhexidine in a sterile fashion, and a sterile drape was applied covering the operative field. Maximum barrier sterile technique with sterile gowns and gloves were used for the procedure. Local anesthesia was provided with 1% lidocaine. After creating a small venotomy incision, a 21 gauge needle was advanced into the right internal jugular vein under direct, real-time ultrasound guidance. Ultrasound image documentation was performed. After securing guidewire access, a peel-away sheath was placed over a guide wire. Utilizing guide wire measurement, a power injectable 5 [place], dual-lumen non tunneled catheter was cut to 15 cm. The catheter was then placed through the sheath and the sheath removed. Final catheter positioning was confirmed and documented with  a fluoroscopic spot image. The catheter was aspirated, flushed with saline, and injected with appropriate volume heparin dwells. The catheter exit site was secured with 0-Prolene retention sutures. COMPLICATIONS: None.  No pneumothorax. FINDINGS: After catheter placement, the tip lies at the cavoatrial junction. The catheter aspirates normally and is ready for immediate use. IMPRESSION: Placement of non-tunneled central venous catheter via the right internal jugular vein. The catheter tip lies at the cavoatrial junction. The catheter is ready for immediate use. Electronically Signed   By: Aletta Edouard M.D.   On: 09/27/2016 14:25   Ir US Guide Vasc Access Right  Result Date: 09/27/2016 CLINICAL DATA:  Poor intravenous access and need for central line. EXAM: NON-TUNNELED CENTRAL VENOUS CATHETER PLACEMENT WITH ULTRASOUND AND FLUOROSCOPIC GUIDANCE FLUOROSCOPY TIME:  18 seconds.  1.0 mGy. PROCEDURE: The procedure, risks, benefits, and alternatives were explained to the patient's wife. Questions regarding the procedure were encouraged and answered. The patient's wife understands and consents to the procedure. The right neck was prepped with chlorhexidine in a sterile fashion, and a sterile drape was applied covering the operative field. Maximum barrier sterile technique with sterile gowns and gloves were used for the procedure. Local anesthesia was provided with 1% lidocaine. After creating a small venotomy incision, a 21 gauge needle was advanced into the right internal jugular vein under direct, real-time ultrasound guidance. Ultrasound image documentation was performed. After securing guidewire access, a peel-away sheath was placed over a guide wire. Utilizing guide wire measurement, a power injectable 5 [place], dual-lumen non tunneled catheter was cut to 15 cm. The catheter was then placed through the sheath and the sheath removed. Final catheter positioning was confirmed and documented with a fluoroscopic spot  image. The catheter was aspirated, flushed with saline, and injected with appropriate volume heparin dwells. The catheter exit site was secured with 0-Prolene retention sutures. COMPLICATIONS: None.  No pneumothorax. FINDINGS: After catheter placement, the tip lies at the cavoatrial junction. The catheter aspirates normally and is ready for immediate use. IMPRESSION: Placement of non-tunneled central venous catheter via the right internal jugular vein. The catheter tip lies at the cavoatrial junction. The catheter is ready for immediate use. Electronically Signed   By: Aletta Edouard M.D.   On: 09/27/2016 14:25     LOS: 1 day   Oren Binet, MD  [facility] Pager:[phone]  If 7PM-7AM, please contact night-coverage [email or web address] Password [facility] 10/08/2016, 11:31 AM

## 2016-10-08 NOTE — Progress Notes (Signed)
Patient ID: Oscar Castillo, male   DOB: 07-18-1951, 66 y.o.   MRN: [id number] S:No new complaints O:BP 106/65 (BP Location: Left Arm)   Pulse 66   Temp 99.1 F (37.3 C) (Oral)   Resp 14   Ht 5\' 10"  (1.778 m)   Wt 88.9 kg (195 lb 14.4 oz)   SpO2 99%   BMI 28.11 kg/m   Intake/Output Summary (Last 24 hours) at 10/08/16 0938 Last data filed at 10/07/16 1815  Gross per 24 hour  Intake              240 ml  Output                0 ml  Net              240 ml   Intake/Output: I/O last 3 completed shifts: In: 240 [P.O.:240] Out: -   Intake/Output this shift:  No intake/output data recorded. Weight change:  Gen:NAD CVS:RRR Resp:cta VOH:YWVPXT Ext:s/p bilateral BKA's, LUE AVF +T/B   Recent Labs Lab 10/02/16 0751 10/07/16 1256 10/08/16 0300  NA 134* 139 134*  K 3.8 3.7 3.9  CL 96* 99* 98*  CO2 28 26 23   GLUCOSE 131* 152* 127*  BUN 19 71* 80*  CREATININE 6.41* 12.64* 13.79*  ALBUMIN  --  3.2*  --   CALCIUM 8.3* 8.9 8.5*  AST  --  10*  --   ALT  --  8*  --    Liver Function Tests:  Recent Labs Lab 10/07/16 1256  AST 10*  ALT 8*  ALKPHOS 43  BILITOT 0.5  PROT 7.0  ALBUMIN 3.2*   No results for input(s): LIPASE, AMYLASE in the last 168 hours. No results for input(s): AMMONIA in the last 168 hours. CBC:  Recent Labs Lab 10/02/16 0751 10/07/16 1124 10/08/16 0300  WBC 7.0 7.1 7.3  HGB 10.4* 10.0* 9.4*  HCT 32.1* 30.3* 28.4*  MCV 87.7 89.4 89.0  PLT 209 204 199   Cardiac Enzymes: No results for input(s): CKTOTAL, CKMB, CKMBINDEX, TROPONINI in the last 168 hours. CBG:  Recent Labs Lab 10/02/16 0659 10/02/16 1122 10/07/16 1723 10/07/16 2211 10/08/16 0740  GLUCAP 134* 147* 146* 177* 107*    Iron Studies: No results for input(s): IRON, TIBC, TRANSFERRIN, FERRITIN in the last 72 hours. Studies/Results: No results found. [name] amiodarone  150 mg Intravenous Once  . insulin aspart  0-5 Units Subcutaneous QHS  . insulin aspart  0-9 Units Subcutaneous TID  WC  . warfarin  5 mg Oral ONCE-1800  . Warfarin - Pharmacist Dosing Inpatient   Does not apply q1800    BMET    Component Value Date/Time   NA 134 (L) 10/08/2016 0300   K 3.9 10/08/2016 0300   CL 98 (L) 10/08/2016 0300   CO2 23 10/08/2016 0300   GLUCOSE 127 (H) 10/08/2016 0300   BUN 80 (H) 10/08/2016 0300   CREATININE 13.79 (H) 10/08/2016 0300   CREATININE 8.97 (H) 10/07/2015 1500   CALCIUM 8.5 (L) 10/08/2016 0300   GFRNONAA 3 (L) 10/08/2016 0300   GFRNONAA 6 (L) 10/07/2015 1500   GFRAA 4 (L) 10/08/2016 0300   GFRAA 6 (L) 10/07/2015 1500   CBC    Component Value Date/Time   WBC 7.3 10/08/2016 0300   RBC 3.19 (L) 10/08/2016 0300   HGB 9.4 (L) 10/08/2016 0300   HCT 28.4 (L) 10/08/2016 0300   PLT 199 10/08/2016 0300   MCV 89.0 10/08/2016 0300  MCH 29.5 10/08/2016 0300   MCHC 33.1 10/08/2016 0300   RDW 17.5 (H) 10/08/2016 0300   LYMPHSABS 2.3 09/26/2016 1119   MONOABS 0.5 09/26/2016 1119   EOSABS 0.1 09/26/2016 1119   BASOSABS 0.0 09/26/2016 1119    [facility] 14min 84kg 2/2.25 bath Hep 3400 LUA AVF - no meds   Assessment/ Plan:   1. A fib with RVR- on coumadin and started on IV amiodarone. 2. GI bleed due to gastritis, discharged 10/02/16 3. ESRD continue with HD qTTS, plan for HD today. 4. Vascular access- s/p PTA of LUE AVF 10/06/16, +T/B will need to remove superficial stitch today with HD.  f/u with Dr. Augustin Coupe in 2 weeks  5. Anemia: on ESA and follow H/H  6. CKD-MBD: continue with binders 7. Nutrition: renal diet 8. Hypertension: stable   Donetta Potts, MD [employer] [phone]

## 2016-10-08 NOTE — Progress Notes (Signed)
Progress Note  Patient Name: Oscar Castillo Date of Encounter: 10/08/2016  Primary Cardiologist: Harrington Challenger  Subjective   Feels well. Converted back to NSR - did not notice any difference.  Inpatient Medications    Scheduled Meds: . amiodarone  150 mg Intravenous Once  . insulin aspart  0-5 Units Subcutaneous QHS  . insulin aspart  0-9 Units Subcutaneous TID WC  . warfarin  5 mg Oral ONCE-1800  . Warfarin - Pharmacist Dosing Inpatient   Does not apply q1800   Continuous Infusions: . amiodarone 30 mg/hr (10/08/16 0823)  . heparin 1,050 Units/hr (10/08/16 0413)   PRN Meds:    Vital Signs    Vitals:   10/07/16 1104 10/08/16 0413  BP: 125/65 106/65  Pulse:  66  Resp: 18 14  Temp: 98.7 F (37.1 C) 99.1 F (37.3 C)  TempSrc: Oral Oral  SpO2: 99% 99%  Weight: 86.9 kg (191 lb 8 oz) 88.9 kg (195 lb 14.4 oz)  Height: 5\' 10"  (1.778 m)     Intake/Output Summary (Last 24 hours) at 10/08/16 0957 Last data filed at 10/07/16 1815  Gross per 24 hour  Intake              240 ml  Output                0 ml  Net              240 ml   Filed Weights   10/07/16 1104 10/08/16 0413  Weight: 86.9 kg (191 lb 8 oz) 88.9 kg (195 lb 14.4 oz)    Telemetry    NSR - Personally Reviewed  ECG    10/07/16 - atrial fibrillation with RVR, LBBB, left axis - Personally Reviewed  Physical Exam  Comfortable lying flat GEN: No acute distress.   Neck: No JVD Cardiac: RRR, no murmurs, rubs, or gallops.  Respiratory: Clear to auscultation bilaterally. GI: Soft, nontender, non-distended  MS: No edema; s/p BKA bilat. L arm AV fistula Neuro:  Nonfocal  Psych: Normal affect   Labs    Chemistry Recent Labs Lab 10/02/16 0751 10/07/16 1256 10/08/16 0300  NA 134* 139 134*  K 3.8 3.7 3.9  CL 96* 99* 98*  CO2 28 26 23   GLUCOSE 131* 152* 127*  BUN 19 71* 80*  CREATININE 6.41* 12.64* 13.79*  CALCIUM 8.3* 8.9 8.5*  PROT  --  7.0  --   ALBUMIN  --  3.2*  --   AST  --  10*  --   ALT  --   8*  --   ALKPHOS  --  43  --   BILITOT  --  0.5  --   GFRNONAA 8* 4* 3*  GFRAA 9* 4* 4*  ANIONGAP 10 14 13      Hematology Recent Labs Lab 10/02/16 0751 10/07/16 1124 10/08/16 0300  WBC 7.0 7.1 7.3  RBC 3.66* 3.39* 3.19*  HGB 10.4* 10.0* 9.4*  HCT 32.1* 30.3* 28.4*  MCV 87.7 89.4 89.0  MCH 28.4 29.5 29.5  MCHC 32.4 33.0 33.1  RDW 15.8* 17.4* 17.5*  PLT 209 204 199     Patient Profile     66 y.o. male admitted for atrial fibrillation with RVR on background of recent GI bleed, history of CVA, HFpEF (in past EF 35%, now 50-55%), ESRD on HD, bilateral below-knee leg amputations, LBBB  Assessment & Plan    1. Paroxysmal AFib: back in NSR. INR subtherapeutic, but expect INR will  gradually increase after amiodarone due to warfarin interaction. Switch to PO amiodarone (400 mg PO BID for 2 weeks, then 400 mg daily for 2 weeks, then 200 mg daily) and monitor INR frequently as an outpt. [facility] Cardiology standpoint can DC after HD. Check TSH. 2. ESRD: for HD today (usually TTS at 0530h). Unaware of arrhythmia and asymptomatic during AFib. 3. Recent GI bleed:  Source was not identified despite endoscopy and capsule enteroscopy. 4. CHF: most recent EF low normal. All HTN/CHF meds are on hold due to hypotension since the GI bleed event. Current BP still precludes conventional HF meds. 5. S/P bilateral BKA: has bilateral prostheses. Very limited ambulation ability.  Signed, Sanda Klein, MD  10/08/2016, 9:57 AM

## 2016-10-08 NOTE — Progress Notes (Signed)
OT Cancellation Note  Patient Details Name: Oscar Castillo MRN: [id number] DOB: 1951-02-17   Cancelled Treatment:    Reason Eval/Treat Not Completed: Patient at procedure or test/ unavailable (currently in HD). Will follow up for OT eval as time allows.  Binnie Kand M.S., OTR/L Pager: [phone]  10/08/2016, 2:06 PM

## 2016-10-08 NOTE — Progress Notes (Addendum)
Nutrition Brief Note  Patient identified on the Malnutrition Screening Tool (MST) Report.  Wt Readings from Last 15 Encounters:  10/08/16 195 lb 14.4 oz (88.9 kg)  10/07/16 210 lb (95.3 kg)  10/01/16 185 lb 3 oz (84 kg)  09/16/16 230 lb (104.3 kg)  08/20/16 235 lb (106.6 kg)  08/04/16 235 lb (106.6 kg)  07/08/16 225 lb (102.1 kg)  07/01/16 225 lb 1.4 oz (102.1 kg)  06/16/16 225 lb (102.1 kg)  06/01/16 225 lb (102.1 kg)  05/14/16 225 lb (102.1 kg)  04/03/16 230 lb (104.3 kg)  03/31/16 230 lb (104.3 kg)  01/20/16 185 lb (83.9 kg)  01/09/16 197 lb (89.4 kg)   BMI is 32.2 kg/m2 (adjusted for bilateral BKA's).  Patient meets criteria for Obesity Class I based on current BMI.   Current diet order is Heart Healthy/Carbohydrate Modified, patient is consuming approximately 80% of meals at this time.   Labs and medications reviewed.   Nutrition focused physical exam completed.  No muscle or subcutaneous fat depletion noticed.  No nutrition interventions warranted at this time. If nutrition issues arise, please consult RD.   Arthur Holms, RD, LDN Pager #: [phone] After-Hours Pager #: [phone]

## 2016-10-09 ENCOUNTER — Ambulatory Visit: Payer: Self-pay | Admitting: Internal Medicine

## 2016-10-09 LAB — PROTIME-INR
INR: 1.44
INR: 1.42
PROTHROMBIN TIME: 17.5 s — AB (ref 11.4–15.2)
PROTHROMBIN TIME: 17.6 s — AB (ref 11.4–15.2)

## 2016-10-09 LAB — CBC
HCT: 29.8 % — ABNORMAL LOW (ref 39.0–52.0)
Hemoglobin: 9.4 g/dL — ABNORMAL LOW (ref 13.0–17.0)
MCH: 28.1 pg (ref 26.0–34.0)
MCHC: 31.5 g/dL (ref 30.0–36.0)
MCV: 89 fL (ref 78.0–100.0)
PLATELETS: 199 10*3/uL (ref 150–400)
RBC: 3.35 MIL/uL — AB (ref 4.22–5.81)
RDW: 17.9 % — ABNORMAL HIGH (ref 11.5–15.5)
WBC: 5.6 10*3/uL (ref 4.0–10.5)

## 2016-10-09 LAB — GLUCOSE, CAPILLARY
Glucose-Capillary: 110 mg/dL — ABNORMAL HIGH (ref 65–99)
Glucose-Capillary: 122 mg/dL — ABNORMAL HIGH (ref 65–99)

## 2016-10-09 LAB — HEPARIN LEVEL (UNFRACTIONATED): Heparin Unfractionated: 0.73 IU/mL — ABNORMAL HIGH (ref 0.30–0.70)

## 2016-10-09 MED ORDER — CALCIUM ACETATE (PHOS BINDER) 667 MG PO CAPS
1334.0000 mg | ORAL_CAPSULE | Freq: Three times a day (TID) | ORAL | Status: DC
  Administered 2016-10-09: 1334 mg via ORAL
  Filled 2016-10-09: qty 2

## 2016-10-09 MED ORDER — CINACALCET HCL 30 MG PO TABS
30.0000 mg | ORAL_TABLET | Freq: Every day | ORAL | Status: DC
  Filled 2016-10-09: qty 1

## 2016-10-09 MED ORDER — AMIODARONE HCL 400 MG PO TABS: 400.0000 mg | ORAL_TABLET | Freq: Two times a day (BID) | ORAL | 0 refills | Status: DC

## 2016-10-09 MED ORDER — AMIODARONE HCL 400 MG PO TABS: 400.0000 mg | ORAL_TABLET | Freq: Every day | ORAL | 0 refills | Status: DC

## 2016-10-09 MED ORDER — AMIODARONE HCL 200 MG PO TABS: 200.0000 mg | ORAL_TABLET | Freq: Every day | ORAL | 0 refills | Status: DC

## 2016-10-09 NOTE — Care Management Note (Signed)
Case Management Note  Patient Details  Name: Oscar Castillo MRN: [id number] Date of Birth: January 27, 1951  Subjective/Objective:  Pt presented for Atrial Fib. Plan will be for d/c home with [facility] Service Resumption Order for PT Services.                  Action/Plan: [facility] to provide services and SOC to begin within 24-48 hours of d/c. No further needs from CM at this time.   Expected Discharge Date:  10/09/16               Expected Discharge Plan:  [place]  In-House Referral:  NA  Discharge planning Services  CM Consult  Post Acute Care Choice:  Home Health, Resumption of Svcs/PTA Provider Choice offered to:  Patient  DME Arranged:  N/A DME Agency:  NA  HH Arranged:  PT [place] Agency:  [facility]  Status of Service:  Completed, signed off  If discussed at [place] of Stay Meetings, dates discussed:    Additional Comments:  Bethena Roys, RN 10/09/2016, 11:19 AM

## 2016-10-09 NOTE — Progress Notes (Signed)
ANTICOAGULATION CONSULT NOTE - Follow Up Consult  Pharmacy Consult for Heparin  Indication: atrial fibrillation  No Known Allergies  Patient Measurements: Height: 5\' 10"  (177.8 cm) Weight: 193 lb 12.6 oz (87.9 kg) IBW/kg (Calculated) : 73  Vital Signs: Temp: 98.6 F (37 C) (03/16 0015) Temp Source: Oral (03/16 0015) BP: 137/85 (03/15 2047) Pulse Rate: 77 (03/15 2047)  Labs:  Recent Labs  10/07/16 0823  10/07/16 1124 10/07/16 1256  10/08/16 0300 10/08/16 1114 10/09/16 0019  HGB  --   < > 10.0*  --   --  9.4*  --  9.4*  HCT  --   --  30.3*  --   --  28.4*  --  29.8*  PLT  --   --  204  --   --  199  --  199  LABPROT  --   --   --   --   --  18.4*  --  17.6*  INR 1.4  --   --   --   --  1.51  --  1.44  HEPARINUNFRC  --   --   --   --   < > 0.74* 0.72* 0.73*  CREATININE  --   --   --  12.64*  --  13.79*  --   --   < > = values in this interval not displayed.  Estimated Creatinine Clearance: 6 mL/min (A) (by C-G formula based on SCr of 13.79 mg/dL (H)).   Assessment: Heparin for afib, heparin level is high at 0.73 despite rate decreases, no issues per RN.   Goal of Therapy:  Heparin level 0.3-0.7 units/ml Monitor platelets by anticoagulation protocol: Yes   Plan:  -Dec heparin to 800 units/hr -1000 HL  Narda Bonds 10/09/2016,1:24 AM

## 2016-10-09 NOTE — Progress Notes (Signed)
Patient ID: Oscar Castillo, male   DOB: 07-26-1951, 66 y.o.   MRN: [id number]  [facility] Progress Note    Subjective:   No new complaints   Objective:   BP 135/90 (BP Location: Right Arm)   Pulse 80   Temp 98.8 F (37.1 C) (Oral)   Resp 16   Ht 5\' 10"  (1.778 m)   Wt 87.9 kg (193 lb 12.6 oz)   SpO2 100%   BMI 27.81 kg/m   Intake/Output: I/O last 3 completed shifts: In: 120 [P.O.:120] Out: 1000 [Other:1000]   Intake/Output this shift:  No intake/output data recorded. Weight change: 2.036 kg (4 lb 7.8 oz)  Physical Exam: Gen:WD NAD CVS:RRR no rub Resp:cta OFB:PZWCHE Ext:s/p bilateral BKA's. LAVF+T/B  Labs: BMET  Recent Labs Lab 10/07/16 1256 10/08/16 0300  NA 139 134*  K 3.7 3.9  CL 99* 98*  CO2 26 23  GLUCOSE 152* 127*  BUN 71* 80*  CREATININE 12.64* 13.79*  ALBUMIN 3.2*  --   CALCIUM 8.9 8.5*   CBC  Recent Labs Lab 10/07/16 1124 10/08/16 0300 10/09/16 0019  WBC 7.1 7.3 5.6  HGB 10.0* 9.4* 9.4*  HCT 30.3* 28.4* 29.8*  MCV 89.4 89.0 89.0  PLT 204 199 199    @IMGRELPRIORS @ Medications:    . amiodarone  400 mg Oral BID   Followed by  . [START ON 10/23/2016] amiodarone  400 mg Oral Daily   Followed by  . [START ON 11/06/2016] amiodarone  200 mg Oral Daily  . insulin aspart  0-5 Units Subcutaneous QHS  . insulin aspart  0-9 Units Subcutaneous TID WC  . pantoprazole  40 mg Oral Q1200  . Warfarin - Pharmacist Dosing Inpatient   Does not apply q1800   [facility] 3h 79min 84kg 2/2.25 bath Hep 3400 LUA AVF - no meds  Assessment/ Plan:   1. A fib with RVR- on coumadin and started on IV amiodarone. 2. GI bleed due to gastritis, discharged 10/02/16 3. ESRD continue with HD qTTS, plan for HD today. 4. Vascular access- s/p PTA of LUE AVF 10/06/16, +T/B.  f/u with Dr. Augustin Coupe in 2 weeks  5. Anemia: on ESA and follow H/H  6. CKD-MBD: continue with binders and sensipar will order as not restarted. 7. Nutrition: renal  diet 8. Hypertension: stable Donetta Potts, MD [place] Pager [phone] 10/09/2016, 9:44 AM

## 2016-10-09 NOTE — Evaluation (Signed)
Physical Therapy Evaluation Patient Details Name: Oscar Castillo MRN: [id number] DOB: Dec 15, 1950 Today's Date: 10/09/2016   History of Present Illness  Oscar Castillo is a 66 y.o. male who presents with Afib w/ RVR and hypotension from cardiologist office. H/o recent GI bleed on anticoagulation. PMH includes B BKA (2014/2015), PAF, ESRD (HD T-Th-Sa), LBBB, Hep C, CHF, CVA, chronic back pain, and anemia  Clinical Impression  Pt admitted with above diagnosis. Pt currently with functional limitations due to the deficits listed below (see PT Problem List).  Pt will benefit from skilled PT to increase their independence and safety with mobility to allow discharge to the venue listed below.  Pt is at level where wife can A him, although recommend HHPT for continued strengthening to ease burden on caregiver and for home assessment.  No DME needed.     Follow Up Recommendations Home health PT;Supervision for mobility/OOB    Equipment Recommendations  None recommended by PT    Recommendations for Other Services       Precautions / Restrictions Precautions Precautions: Fall Precaution Comments: B BKA- B prosthesis      Mobility  Bed Mobility Overal bed mobility: Needs Assistance Bed Mobility: Supine to Sit     Supine to sit: Mod assist     General bed mobility comments: MOD A to get trunk upright and to get to hips to EOB. Donned prosthesis on EOB.  Transfers Overall transfer level: Needs assistance   Transfers: Lateral/Scoot Transfers          Lateral/Scoot Transfers: Mod assist;+2 safety/equipment General transfer comment: Lateral scoot to the L into drop arm recliner with MOD A with 2nd person present for safety. Use of bed pad and took several breaks with transfer.  Ambulation/Gait                Stairs            Wheelchair Mobility    Modified Rankin (Stroke Patients Only)       Balance Overall balance assessment: Needs assistance Sitting-balance  support: Bilateral upper extremity supported Sitting balance-Leahy Scale: Fair Sitting balance - Comments: no LOB, but did require constant UE support                                     Pertinent Vitals/Pain Pain Assessment: No/denies pain    Home Living Family/patient expects to be discharged to:: Private residence Living Arrangements: Spouse/significant other Available Help at Discharge: Family;Available 24 hours/day Type of Home: House Home Access: Level entry     Home Layout: One level Home Equipment: Walker - 2 wheels;Wheelchair - [employer];Tub bench;Hospital bed;Other (comment) (trapeze bar, w/c cushion) Additional Comments: Pt reports he is sleeping in his bed with trapeze bar above bed.    Prior Function Level of Independence: Needs assistance   Gait / Transfers Assistance Needed: primarily uses WC.  Per pt report he puts on his bil prosthesis and scoots to WC.  He squats to 3-in-1 using the handles on the [facility] and he can stand with RW, but does not currently walk  ADL's / Homemaking Assistance Needed: per pt report he usually sponges off  Comments: wife reports she has to give him > 50% A with transfers     Hand Dominance   Dominant Hand: Right    Extremity/Trunk Assessment   Upper Extremity Assessment Upper Extremity Assessment: Overall WFL for tasks assessed;Generalized weakness  Lower Extremity Assessment Lower Extremity Assessment: Overall WFL for tasks assessed;Generalized weakness;RLE deficits/detail;LLE deficits/detail RLE Deficits / Details: BKA with extension restriction noted LLE Deficits / Details: BKA with extension restriction noted       Communication   Communication: No difficulties  Cognition Arousal/Alertness: Awake/alert Behavior During Therapy: WFL for tasks assessed/performed Overall Cognitive Status: Within Functional Limits for tasks assessed                      General Comments       Exercises     Assessment/Plan    PT Assessment Patient needs continued PT services  PT Problem List Decreased strength;Decreased activity tolerance;Decreased balance;Decreased mobility       PT Treatment Interventions DME instruction;Functional mobility training;Therapeutic activities;Therapeutic exercise;Balance training;Patient/family education;Wheelchair mobility training    PT Goals (Current goals can be found in the Care Plan section)  Acute Rehab PT Goals Patient Stated Goal: go home PT Goal Formulation: With patient/family Time For Goal Achievement: 10/16/16 Potential to Achieve Goals: Good    Frequency Min 3X/week   Barriers to discharge        Co-evaluation               End of Session Equipment Utilized During Treatment: Gait belt Activity Tolerance: Patient tolerated treatment well Patient left: in chair;with call bell/phone within reach;with family/visitor present   PT Visit Diagnosis: Muscle weakness (generalized) (M62.81)         Time: 1537-9432 PT Time Calculation (min) (ACUTE ONLY): 27 min   Charges:   PT Evaluation $PT Eval Moderate Complexity: 1 Procedure PT Treatments $Therapeutic Activity: 8-22 mins   PT G Codes:         Makayle Krahn LUBECK 10/09/2016, 10:09 AM

## 2016-10-09 NOTE — Progress Notes (Signed)
Pt bp elevated from previous shift. 167/94 and repeat bp of 159/95. On call cardiology notified per nursing orders. Will continue to monitor pt and perform hourly rounding. All other vs wnl.

## 2016-10-09 NOTE — Progress Notes (Signed)
OT Cancellation Note  Patient Details Name: Oscar Castillo MRN: [id number] DOB: 1951/06/30   Cancelled Treatment:    Reason Eval/Treat Not Completed: OT screened, no needs identified, will sign off  [facility], OT/L  [phone] 10/09/2016 10/09/2016, 11:28 AM

## 2016-10-09 NOTE — Discharge Summary (Signed)
PATIENT DETAILS Name: Oscar Castillo Age: 66 y.o. Sex: male Date of Birth: [birth year] MRN: [id number]. Admitting Physician: Waldemar Dickens, MD JSH:[id number] Oscar Reichmann, MD  Admit Date: 10/07/2016 Discharge date: 10/09/2016  Recommendations for Outpatient Follow-up:  1. Follow up with PCP in 1-2 weeks 2. Please obtain BMP/CBC in one week 3. Please ensure follow-up with Coumadin clinic-already has an existing appointment at 3/19  4. Please ensure follow-up with gastroenterology and cardiology.  Admitted From:  Home   Disposition: Home with home health services   Home Health:  Yes  Equipment/Devices: None  Discharge Condition: Stable  CODE STATUS: FULL CODE  Diet recommendation:  Heart Healthy / Carb Modified   Brief Summary: See H&P, Labs, Consult and Test reports for all details in brief, Patient is a 66 y.o. male with past medical history of recent GI bleeding with negative workup, chronic systolic heart failure (in past EF 35%, now 50-55%), bilateral BKA, ESRD on HD-admitted for A. fib with RVR. Started on amiodarone infusion, has converted back to sinus rhythm this morning. See below for further details  Brief Hospital Course: Atrial fibrillation with RVR: Rate controlled with IV amiodarone, he spontaneously converted back to sinus rhythm, he is now on oral amiodarone. While in the hospital, he was maintained on IV heparin/Coumadin overlap. Given history of recent GI bleeding, it is reasonable to allow the Coumadin to increase slowly. Plans are to continue amiodarone with slow taper as indicated below, he already has an appointment at the Coumadin clinic on 3/19 which he has been asked to keep. He will need outpatient follow-up with cardiology.  Chronic combined systolic and diastolic congestive heart failure: Compensated, managed with volume removal during hemodialysis. Since BP soft, not on a beta blocker or ACEI/ARB   ESRD: HD on TTS, nephrology follow patient closely  during this hospital stay.  History of recent GI bleeding: Underwent intensive workup including a EGD, colonoscopy and a capsule endoscopy which were all negative during his most recent admission (3/3-3/9). No overt bleeding evident at this time. Please ensure follow-up with gastroenterology.  Anemia: Multifactorial-probably from recent GI bleeding and anemia of chronic kidney disease. Hemoglobin remained stable.  DM-2: CBGs stable on SSI.  History of bilateral BKA  Physical deconditioning: Suspect multifactorial etiology-suspect has a very poor/minimal ambulation at baseline-probably worsened due to recent acute illnesses.  Procedures/Studies: None  Discharge Diagnoses:  Principal Problem:   Atrial fibrillation with RVR (HCC) Active Problems:   Depression   GERD   Hyperlipidemia   DM (diabetes mellitus) type I controlled with renal manifestation (HCC)   ESRD on hemodialysis (HCC)   Hypotension   Diabetes mellitus with complication (HCC)   Anemia due to stage 5 chronic kidney disease [facility])   Discharge Instructions:  Activity:  As tolerated with Full fall precautions use walker/cane & assistance as needed   Discharge Instructions    Call MD for:  difficulty breathing, headache or visual disturbances    Complete by:  As directed    Diet - low sodium heart healthy    Complete by:  As directed    Increase activity slowly    Complete by:  As directed      Allergies as of 10/09/2016   No Known Allergies     Medication List    STOP taking these medications   feeding supplement Liqd   HYDROcodone-acetaminophen 5-325 MG tablet Commonly known as:  NORCO/VICODIN   polyethylene glycol packet Commonly known as:  MIRALAX / GLYCOLAX  traMADol 50 MG tablet Commonly known as:  ULTRAM     TAKE these medications   acetaminophen 500 MG tablet Commonly known as:  TYLENOL Take 500 mg by mouth every 6 (six) hours as needed for mild pain.   amiodarone 400 MG  tablet Commonly known as:  PACERONE Take 1 tablet (400 mg total) by mouth 2 (two) times daily. Last dose on 09/2916.   amiodarone 400 MG tablet Commonly known as:  PACERONE Take 1 tablet (400 mg total) by mouth daily. Start on Fri 10/23/16 at 1000, For 14 doses Start taking on:  10/23/2016   amiodarone 200 MG tablet Commonly known as:  PACERONE Take 1 tablet (200 mg total) by mouth daily. Start on Fri 11/06/16 at 1000 Start taking on:  11/06/2016   atorvastatin 40 MG tablet Commonly known as:  LIPITOR TAKE 1 TABLET(40 MG) BY MOUTH DAILY AT 6 PM   cyclobenzaprine 5 MG tablet Commonly known as:  FLEXERIL Take 1 tablet (5 mg total) by mouth 3 (three) times daily as needed for muscle spasms.   insulin aspart 100 UNIT/ML injection Commonly known as:  novoLOG Inject 0-9 Units into the skin 3 (three) times daily with meals. What changed:  additional instructions   ketoconazole 2 % cream Commonly known as:  NIZORAL Apply 1 application topically daily as needed for irritation.   latanoprost 0.005 % ophthalmic solution Commonly known as:  XALATAN Place 1 drop into both eyes at bedtime.   lidocaine-prilocaine cream Commonly known as:  EMLA APPLY A SMALL AMOUNT TO SKIN AT THE ACCESS SITE (AVF) AS DIRECTED BEFORE EACH DIALYSIS SESSION THREE DAYS A WEEK   ondansetron 4 MG disintegrating tablet Commonly known as:  ZOFRAN ODT 4mg  ODT q4 hours prn nausea/vomit What changed:  how much to take  how to take this  when to take this  reasons to take this  additional instructions   pantoprazole 40 MG tablet Commonly known as:  PROTONIX Take 1 tablet (40 mg total) by mouth 2 (two) times daily before a meal.   PHOSLO 667 MG capsule Generic drug:  calcium acetate Take 2,001 mg by mouth 3 (three) times daily with meals.   QUEtiapine 25 MG tablet Commonly known as:  SEROQUEL Take 1 tablet (25 mg total) by mouth 2 (two) times daily.   SENSIPAR 30 MG tablet Generic drug:   cinacalcet Take 30 mg by mouth every evening.   silver sulfADIAZINE 1 % cream Commonly known as:  SILVADENE Apply 1 application topically daily as needed (irratation).   triamcinolone cream 0.1 % Commonly known as:  KENALOG Apply 1 application topically as needed (irritation).   VOLTAREN 1 % Gel Generic drug:  diclofenac sodium APPLY 2 GRAMS TO HANDS BID TO TID AS NEEDED FOR PAIN.   warfarin 2.5 MG tablet Commonly known as:  COUMADIN Take 2.5mg  nightly, check INR on 10/05/2016 What changed:  how much to take  how to take this  when to take this  additional instructions      Follow-up Information    [facility] Office Follow up on 10/12/2016.   Specialty:  Cardiology Why:  appointment at 8:30 am. Contact information: [address] [place]       [name], MD. Schedule an appointment as soon as possible for a visit in 2 week(s).   Specialties:  Internal Medicine, Radiology Contact information: [address] [place] [address] [phone]  Dorris Carnes, MD. Schedule an appointment as soon as possible for a visit in 2 week(s).   Specialty:  Cardiology Contact information: [place] [address] [phone]          No Known Allergies  Consultations:   cardiology  Other Procedures/Studies: Ir Fluoro Guide Cv Line Right  Result Date: 09/27/2016 CLINICAL DATA:  Poor intravenous access and need for central line. EXAM: NON-TUNNELED CENTRAL VENOUS CATHETER PLACEMENT WITH ULTRASOUND AND FLUOROSCOPIC GUIDANCE FLUOROSCOPY TIME:  18 seconds.  1.0 mGy. PROCEDURE: The procedure, risks, benefits, and alternatives were explained to the patient's wife. Questions regarding the procedure were encouraged and answered. The patient's wife understands and consents to the procedure. The right neck was prepped with chlorhexidine in a sterile fashion, and a  sterile drape was applied covering the operative field. Maximum barrier sterile technique with sterile gowns and gloves were used for the procedure. Local anesthesia was provided with 1% lidocaine. After creating a small venotomy incision, a 21 gauge needle was advanced into the right internal jugular vein under direct, real-time ultrasound guidance. Ultrasound image documentation was performed. After securing guidewire access, a peel-away sheath was placed over a guide wire. Utilizing guide wire measurement, a power injectable 5 [place], dual-lumen non tunneled catheter was cut to 15 cm. The catheter was then placed through the sheath and the sheath removed. Final catheter positioning was confirmed and documented with a fluoroscopic spot image. The catheter was aspirated, flushed with saline, and injected with appropriate volume heparin dwells. The catheter exit site was secured with 0-Prolene retention sutures. COMPLICATIONS: None.  No pneumothorax. FINDINGS: After catheter placement, the tip lies at the cavoatrial junction. The catheter aspirates normally and is ready for immediate use. IMPRESSION: Placement of non-tunneled central venous catheter via the right internal jugular vein. The catheter tip lies at the cavoatrial junction. The catheter is ready for immediate use. Electronically Signed   By: Aletta Edouard M.D.   On: 09/27/2016 14:25   Ir US Guide Vasc Access Right  Result Date: 09/27/2016 CLINICAL DATA:  Poor intravenous access and need for central line. EXAM: NON-TUNNELED CENTRAL VENOUS CATHETER PLACEMENT WITH ULTRASOUND AND FLUOROSCOPIC GUIDANCE FLUOROSCOPY TIME:  18 seconds.  1.0 mGy. PROCEDURE: The procedure, risks, benefits, and alternatives were explained to the patient's wife. Questions regarding the procedure were encouraged and answered. The patient's wife understands and consents to the procedure. The right neck was prepped with chlorhexidine in a sterile fashion, and a sterile drape was  applied covering the operative field. Maximum barrier sterile technique with sterile gowns and gloves were used for the procedure. Local anesthesia was provided with 1% lidocaine. After creating a small venotomy incision, a 21 gauge needle was advanced into the right internal jugular vein under direct, real-time ultrasound guidance. Ultrasound image documentation was performed. After securing guidewire access, a peel-away sheath was placed over a guide wire. Utilizing guide wire measurement, a power injectable 5 [place], dual-lumen non tunneled catheter was cut to 15 cm. The catheter was then placed through the sheath and the sheath removed. Final catheter positioning was confirmed and documented with a fluoroscopic spot image. The catheter was aspirated, flushed with saline, and injected with appropriate volume heparin dwells. The catheter exit site was secured with 0-Prolene retention sutures. COMPLICATIONS: None.  No pneumothorax. FINDINGS: After catheter placement, the tip lies at the cavoatrial junction. The catheter aspirates normally and is ready for immediate use. IMPRESSION: Placement of non-tunneled central venous catheter via the right internal  jugular vein. The catheter tip lies at the cavoatrial junction. The catheter is ready for immediate use. Electronically Signed   By: Aletta Edouard M.D.   On: 09/27/2016 14:25      TODAY-DAY OF DISCHARGE:  Subjective:   Oscar Castillo today has no headache,no chest abdominal pain,no new weakness tingling or numbness  Objective:   Blood pressure 135/90, pulse 80, temperature 98.8 F (37.1 C), temperature source Oral, resp. rate 16, height 5\' 10"  (1.778 m), weight 87.9 kg (193 lb 12.6 oz), SpO2 100 %.  Intake/Output Summary (Last 24 hours) at 10/09/16 1042 Last data filed at 10/09/16 0129  Gross per 24 hour  Intake              120 ml  Output             1000 ml  Net             -880 ml   Filed Weights   10/08/16 0413 10/08/16 1405 10/08/16 1750   Weight: 88.9 kg (195 lb 14.4 oz) 88.9 kg (195 lb 15.8 oz) 87.9 kg (193 lb 12.6 oz)    Exam: Awake Alert, Oriented *3, No new F.N deficits, Normal affect [place].AT,PERRAL Supple Neck,No JVD, No cervical lymphadenopathy appriciated.  Symmetrical Chest wall movement, Good air movement bilaterally, CTAB RRR,No Gallops,Rubs or new Murmurs, No Parasternal Heave +ve B.Sounds, Abd Soft, Non tender, No organomegaly appriciated, No rebound -guarding or rigidity. No Cyanosis, Clubbing or edema, No new Rash or bruise   PERTINENT RADIOLOGIC STUDIES: Ir Fluoro Guide Cv Line Right  Result Date: 09/27/2016 CLINICAL DATA:  Poor intravenous access and need for central line. EXAM: NON-TUNNELED CENTRAL VENOUS CATHETER PLACEMENT WITH ULTRASOUND AND FLUOROSCOPIC GUIDANCE FLUOROSCOPY TIME:  18 seconds.  1.0 mGy. PROCEDURE: The procedure, risks, benefits, and alternatives were explained to the patient's wife. Questions regarding the procedure were encouraged and answered. The patient's wife understands and consents to the procedure. The right neck was prepped with chlorhexidine in a sterile fashion, and a sterile drape was applied covering the operative field. Maximum barrier sterile technique with sterile gowns and gloves were used for the procedure. Local anesthesia was provided with 1% lidocaine. After creating a small venotomy incision, a 21 gauge needle was advanced into the right internal jugular vein under direct, real-time ultrasound guidance. Ultrasound image documentation was performed. After securing guidewire access, a peel-away sheath was placed over a guide wire. Utilizing guide wire measurement, a power injectable 5 [place], dual-lumen non tunneled catheter was cut to 15 cm. The catheter was then placed through the sheath and the sheath removed. Final catheter positioning was confirmed and documented with a fluoroscopic spot image. The catheter was aspirated, flushed with saline, and injected with appropriate  volume heparin dwells. The catheter exit site was secured with 0-Prolene retention sutures. COMPLICATIONS: None.  No pneumothorax. FINDINGS: After catheter placement, the tip lies at the cavoatrial junction. The catheter aspirates normally and is ready for immediate use. IMPRESSION: Placement of non-tunneled central venous catheter via the right internal jugular vein. The catheter tip lies at the cavoatrial junction. The catheter is ready for immediate use. Electronically Signed   By: Aletta Edouard M.D.   On: 09/27/2016 14:25   Ir US Guide Vasc Access Right  Result Date: 09/27/2016 CLINICAL DATA:  Poor intravenous access and need for central line. EXAM: NON-TUNNELED CENTRAL VENOUS CATHETER PLACEMENT WITH ULTRASOUND AND FLUOROSCOPIC GUIDANCE FLUOROSCOPY TIME:  18 seconds.  1.0 mGy. PROCEDURE: The procedure, risks, benefits, and alternatives  were explained to the patient's wife. Questions regarding the procedure were encouraged and answered. The patient's wife understands and consents to the procedure. The right neck was prepped with chlorhexidine in a sterile fashion, and a sterile drape was applied covering the operative field. Maximum barrier sterile technique with sterile gowns and gloves were used for the procedure. Local anesthesia was provided with 1% lidocaine. After creating a small venotomy incision, a 21 gauge needle was advanced into the right internal jugular vein under direct, real-time ultrasound guidance. Ultrasound image documentation was performed. After securing guidewire access, a peel-away sheath was placed over a guide wire. Utilizing guide wire measurement, a power injectable 5 [place], dual-lumen non tunneled catheter was cut to 15 cm. The catheter was then placed through the sheath and the sheath removed. Final catheter positioning was confirmed and documented with a fluoroscopic spot image. The catheter was aspirated, flushed with saline, and injected with appropriate volume heparin  dwells. The catheter exit site was secured with 0-Prolene retention sutures. COMPLICATIONS: None.  No pneumothorax. FINDINGS: After catheter placement, the tip lies at the cavoatrial junction. The catheter aspirates normally and is ready for immediate use. IMPRESSION: Placement of non-tunneled central venous catheter via the right internal jugular vein. The catheter tip lies at the cavoatrial junction. The catheter is ready for immediate use. Electronically Signed   By: Aletta Edouard M.D.   On: 09/27/2016 14:25     PERTINENT LAB RESULTS: CBC:  Recent Labs  10/08/16 0300 10/09/16 0019  WBC 7.3 5.6  HGB 9.4* 9.4*  HCT 28.4* 29.8*  PLT 199 199   CMET CMP     Component Value Date/Time   NA 134 (L) 10/08/2016 0300   K 3.9 10/08/2016 0300   CL 98 (L) 10/08/2016 0300   CO2 23 10/08/2016 0300   GLUCOSE 127 (H) 10/08/2016 0300   BUN 80 (H) 10/08/2016 0300   CREATININE 13.79 (H) 10/08/2016 0300   CREATININE 8.97 (H) 10/07/2015 1500   CALCIUM 8.5 (L) 10/08/2016 0300   PROT 7.0 10/07/2016 1256   ALBUMIN 3.2 (L) 10/07/2016 1256   AST 10 (L) 10/07/2016 1256   ALT 8 (L) 10/07/2016 1256   ALKPHOS 43 10/07/2016 1256   BILITOT 0.5 10/07/2016 1256   GFRNONAA 3 (L) 10/08/2016 0300   GFRNONAA 6 (L) 10/07/2015 1500   GFRAA 4 (L) 10/08/2016 0300   GFRAA 6 (L) 10/07/2015 1500    GFR Estimated Creatinine Clearance: 6 mL/min (A) (by C-G formula based on SCr of 13.79 mg/dL (H)). No results for input(s): LIPASE, AMYLASE in the last 72 hours. No results for input(s): CKTOTAL, CKMB, CKMBINDEX, TROPONINI in the last 72 hours. Invalid input(s): POCBNP No results for input(s): DDIMER in the last 72 hours. No results for input(s): HGBA1C in the last 72 hours. No results for input(s): CHOL, HDL, LDLCALC, TRIG, CHOLHDL, LDLDIRECT in the last 72 hours.  Recent Labs  10/08/16 1114  TSH 0.311*   No results for input(s): VITAMINB12, FOLATE, FERRITIN, TIBC, IRON, RETICCTPCT in the last 72  hours. Coags:  Recent Labs  10/08/16 0300 10/09/16 0019  INR 1.51 1.42  1.44   Microbiology: Recent Results (from the past 240 hour(s))  MRSA PCR Screening     Status: None   Collection Time: 10/07/16  4:44 PM  Result Value Ref Range Status   MRSA by PCR NEGATIVE NEGATIVE Final    Comment:        The GeneXpert MRSA Assay (FDA approved for NASAL specimens only), is  one component of a comprehensive MRSA colonization surveillance program. It is not intended to diagnose MRSA infection nor to guide or monitor treatment for MRSA infections.     FURTHER DISCHARGE INSTRUCTIONS:  Get Medicines reviewed and adjusted: Please take all your medications with you for your next visit with your Primary MD  Laboratory/radiological data: Please request your Primary MD to go over all hospital tests and procedure/radiological results at the follow up, please ask your Primary MD to get all Hospital records sent to his/her office.  In some cases, they will be blood work, cultures and biopsy results pending at the time of your discharge. Please request that your primary care M.D. goes through all the records of your hospital data and follows up on these results.  Also Note the following: If you experience worsening of your admission symptoms, develop shortness of breath, life threatening emergency, suicidal or homicidal thoughts you must seek medical attention immediately by calling 911 or calling your MD immediately  if symptoms less severe.  You must read complete instructions/literature along with all the possible adverse reactions/side effects for all the Medicines you take and that have been prescribed to you. Take any new Medicines after you have completely understood and accpet all the possible adverse reactions/side effects.   Do not drive when taking Pain medications or sleeping medications (Benzodaizepines)  Do not take more than prescribed Pain, Sleep and Anxiety Medications. It is  not advisable to combine anxiety,sleep and pain medications without talking with your primary care practitioner  Special Instructions: If you have smoked or chewed Tobacco  in the last 2 yrs please stop smoking, stop any regular Alcohol  and or any Recreational drug use.  Wear Seat belts while driving.  Please note: You were cared for by a hospitalist during your hospital stay. Once you are discharged, your primary care physician will handle any further medical issues. Please note that NO REFILLS for any discharge medications will be authorized once you are discharged, as it is imperative that you return to your primary care physician (or establish a relationship with a primary care physician if you do not have one) for your post hospital discharge needs so that they can reassess your need for medications and monitor your lab values.  Total Time spent coordinating discharge including counseling, education and face to face time equals 45 minutes.  SignedOren Binet 10/09/2016 10:42 AM

## 2016-10-10 DIAGNOSIS — D631 Anemia in chronic kidney disease: Secondary | ICD-10-CM | POA: Diagnosis not present

## 2016-10-10 DIAGNOSIS — N2581 Secondary hyperparathyroidism of renal origin: Secondary | ICD-10-CM | POA: Diagnosis not present

## 2016-10-10 DIAGNOSIS — N186 End stage renal disease: Secondary | ICD-10-CM | POA: Diagnosis not present

## 2016-10-10 DIAGNOSIS — E1122 Type 2 diabetes mellitus with diabetic chronic kidney disease: Secondary | ICD-10-CM | POA: Diagnosis not present

## 2016-10-12 ENCOUNTER — Ambulatory Visit (INDEPENDENT_AMBULATORY_CARE_PROVIDER_SITE_OTHER): Payer: Medicare Other | Admitting: *Deleted

## 2016-10-12 ENCOUNTER — Ambulatory Visit (INDEPENDENT_AMBULATORY_CARE_PROVIDER_SITE_OTHER): Payer: Medicare Other | Admitting: Physician Assistant

## 2016-10-12 ENCOUNTER — Encounter: Payer: Self-pay | Admitting: Physician Assistant

## 2016-10-12 VITALS — BP 124/78 | HR 83 | Ht 70.0 in | Wt 193.0 lb

## 2016-10-12 DIAGNOSIS — G459 Transient cerebral ischemic attack, unspecified: Secondary | ICD-10-CM

## 2016-10-12 DIAGNOSIS — Z992 Dependence on renal dialysis: Secondary | ICD-10-CM

## 2016-10-12 DIAGNOSIS — Z5181 Encounter for therapeutic drug level monitoring: Secondary | ICD-10-CM

## 2016-10-12 DIAGNOSIS — F329 Major depressive disorder, single episode, unspecified: Secondary | ICD-10-CM | POA: Diagnosis not present

## 2016-10-12 DIAGNOSIS — Z7901 Long term (current) use of anticoagulants: Secondary | ICD-10-CM | POA: Diagnosis not present

## 2016-10-12 DIAGNOSIS — Z993 Dependence on wheelchair: Secondary | ICD-10-CM | POA: Diagnosis not present

## 2016-10-12 DIAGNOSIS — E1122 Type 2 diabetes mellitus with diabetic chronic kidney disease: Secondary | ICD-10-CM | POA: Diagnosis not present

## 2016-10-12 DIAGNOSIS — I4891 Unspecified atrial fibrillation: Secondary | ICD-10-CM | POA: Diagnosis not present

## 2016-10-12 DIAGNOSIS — K922 Gastrointestinal hemorrhage, unspecified: Secondary | ICD-10-CM

## 2016-10-12 DIAGNOSIS — I638 Other cerebral infarction: Secondary | ICD-10-CM

## 2016-10-12 DIAGNOSIS — I132 Hypertensive heart and chronic kidney disease with heart failure and with stage 5 chronic kidney disease, or end stage renal disease: Secondary | ICD-10-CM | POA: Diagnosis not present

## 2016-10-12 DIAGNOSIS — Z89511 Acquired absence of right leg below knee: Secondary | ICD-10-CM | POA: Diagnosis not present

## 2016-10-12 DIAGNOSIS — I482 Chronic atrial fibrillation, unspecified: Secondary | ICD-10-CM

## 2016-10-12 DIAGNOSIS — I5042 Chronic combined systolic (congestive) and diastolic (congestive) heart failure: Secondary | ICD-10-CM | POA: Diagnosis not present

## 2016-10-12 DIAGNOSIS — I480 Paroxysmal atrial fibrillation: Secondary | ICD-10-CM | POA: Diagnosis not present

## 2016-10-12 DIAGNOSIS — Z89512 Acquired absence of left leg below knee: Secondary | ICD-10-CM | POA: Diagnosis not present

## 2016-10-12 DIAGNOSIS — D631 Anemia in chronic kidney disease: Secondary | ICD-10-CM | POA: Diagnosis not present

## 2016-10-12 DIAGNOSIS — N186 End stage renal disease: Secondary | ICD-10-CM | POA: Diagnosis not present

## 2016-10-12 DIAGNOSIS — K219 Gastro-esophageal reflux disease without esophagitis: Secondary | ICD-10-CM | POA: Diagnosis not present

## 2016-10-12 DIAGNOSIS — Z8673 Personal history of transient ischemic attack (TIA), and cerebral infarction without residual deficits: Secondary | ICD-10-CM | POA: Diagnosis not present

## 2016-10-12 LAB — POCT INR: INR: 1.7

## 2016-10-12 NOTE — Progress Notes (Addendum)
Chief Complaint: Hospital follow-up for recent GI bleed  HPI:  Oscar Castillo is a 66 year old African-American male with a past medical history of A. fib on Coumadin, CVA, depression, diabetes, ESRD on dialysis and multiple others listed below, who presents to clinic today for follow-up after recently being seen in the hospital for hematemesis on 09/26/16.   Per review of chart patient was initially consulted regarding hematemesis on 10/23/16. He had recent EGD, colonoscopy and capsule endoscopy. EGD showed grade A esophagitis, erosive gastritis and duodenitis, colonoscopy revealed ascending colon polyp and capsule endoscopy showed mild gastritis. It was discussed that patient was at high risk for thromboembolic events and warranted anticoagulation, cardiology has resumed Coumadin. Patient was told to continue his PPI.   Today, the patient presents to clinic and tells me he has done well since his discharge from the hospital with no further black stools. He denies abdominal pain. His only real complaint today is that he occasionally has waves of nausea during dialysis and will vomit. He wonders if this is due to all of the medications that he is already taking and asked me if he can come off of any of them. He is on Protonix 40 mg twice a day. Patient denies fever, chills, blood in the stool, melena, weight loss, fatigue and anorexia, heartburn or reflux.  Past Medical History:  Diagnosis Date  . Anemia   . Antral ulcer 2014   small  . Atrial fibrillation with RVR ([place]) 10/07/2016  . BACK PAIN, LUMBAR, CHRONIC   . BENIGN PROSTATIC HYPERTROPHY   . Bipolar disorder ([place])    "sometimes" (10/07/2016)  . CHOLELITHIASIS   . Chronic combined systolic and diastolic CHF (congestive heart failure) ([place])   . Complication of anesthesia    wife states pt had trouble waking up with his last surgery in Nov., 2014  . CVA (cerebral vascular accident) ([place])   . DEPRESSION   . DIABETES MELLITUS, TYPE II    "not  anymore" (10/07/2016)  . ERECTILE DYSFUNCTION   . ESRD (end stage renal disease) on dialysis [facility])    "[place]; TTS" (10/07/2016)  . ESRD on hemodialysis ([place])    ESRD due to DM/HTN. Started dialysis in November 2013.  HD TTS at [facility] on [address].  [name] GERD   . GI bleed    due to gastritis, discharged 10/02/16/notes 10/07/2016  . Headache    "monthly" (10/07/2016)  . Hemorrhoids   . Hepatitis C   . History of blood transfusion    "related to dialysis" (10/07/2016)  . History of Clostridium difficile   . History of kidney stones   . HYPERTENSION   . LBBB (left bundle branch block)   . Morbid obesity ([place])   . PAF (paroxysmal atrial fibrillation) ([place])    a. Dx 12/2015.    Past Surgical History:  Procedure Laterality Date  . AMPUTATION Left 05/12/2013   Procedure: AMPUTATION RAY;  Surgeon: Newt Minion, MD;  Location: [place];  Service: Orthopedics;  Laterality: Left;  Left Foot 1st Ray Amputation  . AMPUTATION Left 06/09/2013   Procedure: AMPUTATION BELOW KNEE;  Surgeon: Newt Minion, MD;  Location: [place];  Service: Orthopedics;  Laterality: Left;  Left Below Knee Amputation and removal proximal screws IM tibial nail  . AMPUTATION Right 09/08/2013   Procedure: AMPUTATION BELOW KNEE;  Surgeon: Newt Minion, MD;  Location: [place];  Service: Orthopedics;  Laterality: Right;  Right Below Knee Amputation  . AMPUTATION Right  10/11/2013   Procedure: AMPUTATION BELOW KNEE;  Surgeon: Newt Minion, MD;  Location: [place];  Service: Orthopedics;  Laterality: Right;  Right Below Knee Amputation Revision  . AV FISTULA PLACEMENT  06/14/2012   Procedure: ARTERIOVENOUS (AV) FISTULA CREATION;  Surgeon: Angelia Mould, MD;  Location: [facility] OR;  Service: Vascular;  Laterality: Left;  Left basilic vein transposition with fistula.  . COLONOSCOPY N/A 10/28/2012   Procedure: COLONOSCOPY;  Surgeon: Jeryl Columbia, MD;  Location: [facility] ENDOSCOPY;  Service: Endoscopy;  Laterality: N/A;  . COLONOSCOPY N/A 11/02/2012    Procedure: COLONOSCOPY;  Surgeon: Cleotis Nipper, MD;  Location: [facility] ENDOSCOPY;  Service: Endoscopy;  Laterality: N/A;  . COLONOSCOPY N/A 11/03/2012   Procedure: COLONOSCOPY;  Surgeon: Cleotis Nipper, MD;  Location: [facility] ENDOSCOPY;  Service: Endoscopy;  Laterality: N/A;  . COLONOSCOPY N/A 09/16/2016   Procedure: COLONOSCOPY;  Surgeon: Jerene Bears, MD;  [facility] ENDOSCOPY;  Service: Gastroenterology;  Laterality: N/A;  . ENTEROSCOPY N/A 11/08/2012   Procedure: ENTEROSCOPY;  Surgeon: Wonda Horner, MD;  Location: [facility] ENDOSCOPY;  Service: Endoscopy;  Laterality: N/A;  . ESOPHAGOGASTRODUODENOSCOPY N/A 11/02/2012   Procedure: ESOPHAGOGASTRODUODENOSCOPY (EGD);  Surgeon: Cleotis Nipper, MD;  Location: [facility] ENDOSCOPY;  Service: Endoscopy;  Laterality: N/A;  . ESOPHAGOGASTRODUODENOSCOPY (EGD) WITH PROPOFOL N/A 09/16/2016   Procedure: ESOPHAGOGASTRODUODENOSCOPY (EGD) WITH PROPOFOL;  Surgeon: Jerene Bears, MD;  [facility] ENDOSCOPY;  Service: Gastroenterology;  Laterality: N/A;  . EYE SURGERY Left    to remove scar tissue  . FRACTURE SURGERY    . GIVENS CAPSULE STUDY N/A 11/04/2012   Procedure: GIVENS CAPSULE STUDY;  Surgeon: Cleotis Nipper, MD;  Location: [facility] ENDOSCOPY;  Service: Endoscopy;  Laterality: N/A;  . GIVENS CAPSULE STUDY N/A 09/29/2016   Procedure: GIVENS CAPSULE STUDY;  Surgeon: Manus Gunning, MD;  Location: [place];  Service: Gastroenterology;  Laterality: N/A;  try to keep pt up in bedside chair as much as possible during the study.    [name] HARDWARE REMOVAL Left 06/09/2013   Procedure: HARDWARE REMOVAL;  Surgeon: Newt Minion, MD;  Location: [place];  Service: Orthopedics;  Laterality: Left;  Left Below Knee Amputation  and Removal proximal screws IM tibial nail  . IR GENERIC HISTORICAL  09/27/2016   IR US GUIDE VASC ACCESS RIGHT 09/27/2016 Aletta Edouard, MD [facility]  . IR GENERIC HISTORICAL  09/27/2016   IR FLUORO GUIDE CV LINE RIGHT 09/27/2016 Aletta Edouard, MD [facility]    . NEPHRECTOMY     partial RR  . ORIF FIBULA FRACTURE Left 09/09/2012   Procedure: OPEN REDUCTION INTERNAL FIXATION (ORIF) FIBULA FRACTURE;  Surgeon: Johnny Bridge, MD;  Location: [place];  Service: Orthopedics;  Laterality: Left;  . TIBIA IM NAIL INSERTION Left 09/09/2012   Procedure: INTRAMEDULLARY (IM) NAIL TIBIAL;  Surgeon: Johnny Bridge, MD;  Location: [place];  Service: Orthopedics;  Laterality: Left;  left tibial nail and open reduction internal fixation left fibula fracture    Current Outpatient Prescriptions  Medication Sig Dispense Refill  . acetaminophen (TYLENOL) 500 MG tablet Take 500 mg by mouth every 6 (six) hours as needed for mild pain.    Derrill Memo ON 11/06/2016] amiodarone (PACERONE) 200 MG tablet Take 1 tablet (200 mg total) by mouth daily. Start on Fri 11/06/16 at 1000 30 tablet 0  . [START ON 10/23/2016] amiodarone (PACERONE) 400 MG tablet Take 1 tablet (400 mg total) by mouth daily. Start on Fri 10/23/16 at 1000, For  14 doses 14 tablet 0  . amiodarone (PACERONE) 400 MG tablet Take 1 tablet (400 mg total) by mouth 2 (two) times daily. Last dose on 09/2916. 25 tablet 0  . atorvastatin (LIPITOR) 40 MG tablet TAKE 1 TABLET(40 MG) BY MOUTH DAILY AT 6 PM 30 tablet 3  . calcium acetate (PHOSLO) 667 MG capsule Take 2,001 mg by mouth 3 (three) times daily with meals.    . cyclobenzaprine (FLEXERIL) 5 MG tablet Take 1 tablet (5 mg total) by mouth 3 (three) times daily as needed for muscle spasms. 60 tablet 1  . insulin aspart (NOVOLOG) 100 UNIT/ML injection Inject 0-9 Units into the skin 3 (three) times daily with meals. (Patient taking differently: Inject 0-9 Units into the skin 3 (three) times daily with meals. Per sliding scale) 10 mL 11  . ketoconazole (NIZORAL) 2 % cream Apply 1 application topically daily as needed for irritation.    [name] latanoprost (XALATAN) 0.005 % ophthalmic solution Place 1 drop into both eyes at bedtime.     . lidocaine-prilocaine (EMLA) cream APPLY A SMALL AMOUNT  TO SKIN AT THE ACCESS SITE (AVF) AS DIRECTED BEFORE EACH DIALYSIS SESSION THREE DAYS A WEEK  6  . pantoprazole (PROTONIX) 40 MG tablet Take 1 tablet (40 mg total) by mouth 2 (two) times daily before a meal. 60 tablet 1  . QUEtiapine (SEROQUEL) 25 MG tablet Take 1 tablet (25 mg total) by mouth 2 (two) times daily. 60 tablet 5  . SENSIPAR 30 MG tablet Take 30 mg by mouth every evening.     . silver sulfADIAZINE (SILVADENE) 1 % cream Apply 1 application topically daily as needed (irratation).    . triamcinolone cream (KENALOG) 0.1 % Apply 1 application topically as needed (irritation).   2  . VOLTAREN 1 % GEL APPLY 2 GRAMS TO HANDS BID TO TID AS NEEDED FOR PAIN.  0  . warfarin (COUMADIN) 2.5 MG tablet Take 2.5mg  nightly, check INR on 10/05/2016 (Patient taking differently: Take 2.5-5 mg by mouth See admin instructions. Take 5 mg by mouth on Tuesday and Wednesday only. Then take 3.75 mg by mouth daily on all days of the week except 2.5 mg by mouth on Sunday.) 30 tablet 0   No current facility-administered medications for this visit.     Allergies as of 10/12/2016  . (No Known Allergies)    Family History  Problem Relation Age of Onset  . Diabetes Mother   . Hypertension Mother   . Heart attack Father   . Hypertension Father   . Coronary artery disease Other     Social History   Social History  . Marital status: Married    Spouse name: N/A  . Number of children: 2  . Years of education: 16   Occupational History  . disabled due to stroke Retired   Social History Main Topics  . Smoking status: Never Smoker  . Smokeless tobacco: Never Used  . Alcohol use No  . Drug use: No  . Sexual activity: Yes    Birth control/ protection: None   Other Topics Concern  . 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 Lungs clear to auscultation bilaterally.   No wheezes, crackles, or rhonchi.  Cardiovascular: Normal S1, S2. No MRG. Regular rate and rhythm. No peripheral edema, cyanosis or pallor.  Gastrointestinal:  Soft, nondistended, nontender. No rebound or guarding. Normal bowel sounds. No appreciable masses or hepatomegaly. Psychiatric:  Demonstrates good judgement and reason without abnormal affect or behaviors.  RELEVANT LABS AND IMAGING: CBC    Component Value Date/Time   WBC 5.6 10/09/2016 0019   RBC 3.35 (L) 10/09/2016 0019   HGB 9.4 (L) 10/09/2016 0019   HCT 29.8 (L) 10/09/2016 0019   PLT 199 10/09/2016 0019   MCV 89.0 10/09/2016 0019   MCH 28.1 10/09/2016 0019   MCHC 31.5 10/09/2016 0019   RDW 17.9 (H) 10/09/2016 0019   LYMPHSABS 2.3 09/26/2016 1119   MONOABS 0.5 09/26/2016 1119   EOSABS 0.1 09/26/2016 1119   BASOSABS 0.0 09/26/2016 1119    CMP     Component Value Date/Time   NA 134 (L) 10/08/2016 0300   K 3.9 10/08/2016 0300   CL 98 (L) 10/08/2016 0300   CO2 23 10/08/2016 0300   GLUCOSE 127 (H) 10/08/2016 0300   BUN 80 (H) 10/08/2016 0300   CREATININE 13.79 (H) 10/08/2016 0300   CREATININE 8.97 (H) 10/07/2015 1500   CALCIUM 8.5 (L) 10/08/2016 0300   PROT 7.0 10/07/2016 1256   ALBUMIN 3.2 (L) 10/07/2016 1256   AST 10 (L) 10/07/2016 1256   ALT 8 (L) 10/07/2016 1256   ALKPHOS 43 10/07/2016 1256   BILITOT 0.5 10/07/2016 1256   GFRNONAA 3 (L) 10/08/2016 0300   GFRNONAA 6 (L) 10/07/2015 1500   GFRAA 4 (L) 10/08/2016 0300   GFRAA 6  (L) 10/07/2015 1500    Assessment: 1. Upper GI bleed: History of anemia and hematemesis during recent hospital admission, no melena or abdominal pain or symptoms since, hemoglobin stable currently last checked on 10/07/16 at 9.4 2. A. fib: Maintained on Coumadin currently 3. ESRD: On hemodialysis  Plan: 1. Continue Pantoprazole 40 mg twice a day, 30-60 minutes before breakfast and dinner. 2. Discussed with the patient that we could try using Zofran for hemodialysis but he declines today as he does not want to be on any more medication. Recommend he follow with his nephrologist regarding his nausea to see if there is anything they can do 3. Patient to follow clinic as needed in the future with Dr. Hilarie Fredrickson or myself  Ellouise Newer, PA-C [facility] 10/12/2016, 10:06 AM  Cc: [name], MD   Addendum: Reviewed and agree with management. Jerene Bears, MD

## 2016-10-13 ENCOUNTER — Ambulatory Visit (INDEPENDENT_AMBULATORY_CARE_PROVIDER_SITE_OTHER): Payer: Medicare Other | Admitting: Internal Medicine

## 2016-10-13 ENCOUNTER — Telehealth: Payer: Self-pay

## 2016-10-13 ENCOUNTER — Encounter: Payer: Self-pay | Admitting: Internal Medicine

## 2016-10-13 ENCOUNTER — Other Ambulatory Visit (INDEPENDENT_AMBULATORY_CARE_PROVIDER_SITE_OTHER): Payer: Medicare Other

## 2016-10-13 VITALS — BP 144/100 | HR 84 | Temp 98.9°F | Wt 252.0 lb

## 2016-10-13 DIAGNOSIS — D638 Anemia in other chronic diseases classified elsewhere: Secondary | ICD-10-CM | POA: Diagnosis not present

## 2016-10-13 DIAGNOSIS — E1022 Type 1 diabetes mellitus with diabetic chronic kidney disease: Secondary | ICD-10-CM

## 2016-10-13 DIAGNOSIS — N186 End stage renal disease: Secondary | ICD-10-CM

## 2016-10-13 DIAGNOSIS — I110 Hypertensive heart disease with heart failure: Secondary | ICD-10-CM | POA: Diagnosis not present

## 2016-10-13 DIAGNOSIS — I638 Other cerebral infarction: Secondary | ICD-10-CM | POA: Diagnosis not present

## 2016-10-13 DIAGNOSIS — Z992 Dependence on renal dialysis: Secondary | ICD-10-CM

## 2016-10-13 DIAGNOSIS — E1122 Type 2 diabetes mellitus with diabetic chronic kidney disease: Secondary | ICD-10-CM | POA: Diagnosis not present

## 2016-10-13 DIAGNOSIS — D631 Anemia in chronic kidney disease: Secondary | ICD-10-CM | POA: Diagnosis not present

## 2016-10-13 DIAGNOSIS — N2581 Secondary hyperparathyroidism of renal origin: Secondary | ICD-10-CM | POA: Diagnosis not present

## 2016-10-13 LAB — BASIC METABOLIC PANEL
BUN: 25 mg/dL — ABNORMAL HIGH (ref 6–23)
CHLORIDE: 94 meq/L — AB (ref 96–112)
CO2: 33 meq/L — AB (ref 19–32)
Calcium: 9.2 mg/dL (ref 8.4–10.5)
Creatinine, Ser: 5.71 mg/dL (ref 0.40–1.50)
GFR: 12.91 mL/min — CL (ref >60.00)
Glucose, Bld: 136 mg/dL — ABNORMAL HIGH (ref 70–99)
POTASSIUM: 3.6 meq/L (ref 3.5–5.1)
SODIUM: 135 meq/L (ref 135–145)

## 2016-10-13 LAB — CBC WITH DIFFERENTIAL/PLATELET
BASOS ABS: 0 10*3/uL (ref 0.0–0.1)
Basophils Relative: 0.8 % (ref 0.0–3.0)
Eosinophils Absolute: 0.1 10*3/uL (ref 0.0–0.7)
Eosinophils Relative: 1.7 % (ref 0.0–5.0)
HCT: 35.9 % — ABNORMAL LOW (ref 39.0–52.0)
Hemoglobin: 11.6 g/dL — ABNORMAL LOW (ref 13.0–17.0)
LYMPHS ABS: 1.1 10*3/uL (ref 0.7–4.0)
Lymphocytes Relative: 18.6 % (ref 12.0–46.0)
MCHC: 32.3 g/dL (ref 30.0–36.0)
MCV: 91.1 fl (ref 78.0–100.0)
MONO ABS: 0.6 10*3/uL (ref 0.1–1.0)
MONOS PCT: 9.9 % (ref 3.0–12.0)
NEUTROS ABS: 4 10*3/uL (ref 1.4–7.7)
NEUTROS PCT: 69 % (ref 43.0–77.0)
PLATELETS: 230 10*3/uL (ref 150.0–400.0)
RBC: 3.94 Mil/uL — AB (ref 4.22–5.81)
RDW: 19.8 % — ABNORMAL HIGH (ref 11.5–15.5)
WBC: 5.7 10*3/uL (ref 4.0–10.5)

## 2016-10-13 NOTE — Progress Notes (Signed)
Pre visit review using our clinic review tool, if applicable. No additional management support is needed unless otherwise documented below in the visit note. 

## 2016-10-13 NOTE — Progress Notes (Addendum)
Subjective:    Patient ID: Oscar Castillo, male    DOB: Feb 16, 1951, 66 y.o.   MRN: [id number]  HPI  Here to f/u recent hospn 3/14-3/16 with afib with RVR, acute decompensation CHF in the setting of ESRD and recent GI bleeding. Pt has been off BP med x 2 wks after hypotension with GI bleeding BP Readings from Last 3 Encounters:  10/13/16 (!) 144/100  10/12/16 124/78  10/09/16 135/90  Earlier today was < 140/90 at dialysis.  No overt bleeding.  Has seen coumadin clinic and has GI and card f/u planned.  Blood sugars have remained in low 100's off insulin, wondering if he needs to restart Past Medical History:  Diagnosis Date  . Anemia   . Antral ulcer 2014   small  . Atrial fibrillation with RVR ([place]) 10/07/2016  . BACK PAIN, LUMBAR, CHRONIC   . BENIGN PROSTATIC HYPERTROPHY   . Bipolar disorder ([place])    "sometimes" (10/07/2016)  . CHOLELITHIASIS   . Chronic combined systolic and diastolic CHF (congestive heart failure) ([place])   . Complication of anesthesia    wife states pt had trouble waking up with his last surgery in Nov., 2014  . CVA (cerebral vascular accident) ([place])   . DEPRESSION   . DIABETES MELLITUS, TYPE II    "not anymore" (10/07/2016)  . ERECTILE DYSFUNCTION   . ESRD (end stage renal disease) on dialysis [facility])    "[place]; TTS" (10/07/2016)  . ESRD on hemodialysis ([place])    ESRD due to DM/HTN. Started dialysis in November 2013.  HD TTS at [facility] on [place].  [name] GERD   . GI bleed    due to gastritis, discharged 10/02/16/notes 10/07/2016  . Headache    "monthly" (10/07/2016)  . Hemorrhoids   . Hepatitis C   . History of blood transfusion    "related to dialysis" (10/07/2016)  . History of Clostridium difficile   . History of kidney stones   . HYPERTENSION   . LBBB (left bundle branch block)   . Morbid obesity ([place])   . PAF (paroxysmal atrial fibrillation) ([place])    a. Dx 12/2015.   Past Surgical History:  Procedure Laterality Date  . AMPUTATION Left 05/12/2013   Procedure: AMPUTATION RAY;  Surgeon: Newt Minion, MD;  Location: [place];  Service: Orthopedics;  Laterality: Left;  Left Foot 1st Ray Amputation  . AMPUTATION Left 06/09/2013   Procedure: AMPUTATION BELOW KNEE;  Surgeon: Newt Minion, MD;  Location: [place];  Service: Orthopedics;  Laterality: Left;  Left Below Knee Amputation and removal proximal screws IM tibial nail  . AMPUTATION Right 09/08/2013   Procedure: AMPUTATION BELOW KNEE;  Surgeon: Newt Minion, MD;  Location: [place];  Service: Orthopedics;  Laterality: Right;  Right Below Knee Amputation  . AMPUTATION Right 10/11/2013   Procedure: AMPUTATION BELOW KNEE;  Surgeon: Newt Minion, MD;  Location: [place];  Service: Orthopedics;  Laterality: Right;  Right Below Knee Amputation Revision  . AV FISTULA PLACEMENT  06/14/2012   Procedure: ARTERIOVENOUS (AV) FISTULA CREATION;  Surgeon: Angelia Mould, MD;  Location: [facility] OR;  Service: Vascular;  Laterality: Left;  Left basilic vein transposition with fistula.  . COLONOSCOPY N/A 10/28/2012   Procedure: COLONOSCOPY;  Surgeon: Jeryl Columbia, MD;  Location: [facility] ENDOSCOPY;  Service: Endoscopy;  Laterality: N/A;  . COLONOSCOPY N/A 11/02/2012   Procedure: COLONOSCOPY;  Surgeon: Cleotis Nipper, MD;  Location: [facility] ENDOSCOPY;  Service: Endoscopy;  Laterality: N/A;  . COLONOSCOPY N/A 11/03/2012   Procedure: COLONOSCOPY;  Surgeon: Cleotis Nipper, MD;  Location: [facility] ENDOSCOPY;  Service: Endoscopy;  Laterality: N/A;  . COLONOSCOPY N/A 09/16/2016   Procedure: COLONOSCOPY;  Surgeon: Jerene Bears, MD;  [facility] ENDOSCOPY;  Service: Gastroenterology;  Laterality: N/A;  . ENTEROSCOPY N/A 11/08/2012   Procedure: ENTEROSCOPY;  Surgeon: Wonda Horner, MD;  Location: [facility] ENDOSCOPY;  Service: Endoscopy;  Laterality: N/A;  . ESOPHAGOGASTRODUODENOSCOPY N/A 11/02/2012   Procedure: ESOPHAGOGASTRODUODENOSCOPY (EGD);  Surgeon: Cleotis Nipper, MD;  Location: [facility] ENDOSCOPY;  Service: Endoscopy;  Laterality: N/A;  .  ESOPHAGOGASTRODUODENOSCOPY (EGD) WITH PROPOFOL N/A 09/16/2016   Procedure: ESOPHAGOGASTRODUODENOSCOPY (EGD) WITH PROPOFOL;  Surgeon: Jerene Bears, MD;  [facility] ENDOSCOPY;  Service: Gastroenterology;  Laterality: N/A;  . EYE SURGERY Left    to remove scar tissue  . FRACTURE SURGERY    . GIVENS CAPSULE STUDY N/A 11/04/2012   Procedure: GIVENS CAPSULE STUDY;  Surgeon: Cleotis Nipper, MD;  Location: [facility] ENDOSCOPY;  Service: Endoscopy;  Laterality: N/A;  . GIVENS CAPSULE STUDY N/A 09/29/2016   Procedure: GIVENS CAPSULE STUDY;  Surgeon: Manus Gunning, MD;  Location: [place];  Service: Gastroenterology;  Laterality: N/A;  try to keep pt up in bedside chair as much as possible during the study.    [name] HARDWARE REMOVAL Left 06/09/2013   Procedure: HARDWARE REMOVAL;  Surgeon: Newt Minion, MD;  Location: [place];  Service: Orthopedics;  Laterality: Left;  Left Below Knee Amputation  and Removal proximal screws IM tibial nail  . IR GENERIC HISTORICAL  09/27/2016   IR US GUIDE VASC ACCESS RIGHT 09/27/2016 Aletta Edouard, MD [facility]  . IR GENERIC HISTORICAL  09/27/2016   IR FLUORO GUIDE CV LINE RIGHT 09/27/2016 Aletta Edouard, MD [facility]  . NEPHRECTOMY     partial RR  . ORIF FIBULA FRACTURE Left 09/09/2012   Procedure: OPEN REDUCTION INTERNAL FIXATION (ORIF) FIBULA FRACTURE;  Surgeon: Johnny Bridge, MD;  Location: [place];  Service: Orthopedics;  Laterality: Left;  . TIBIA IM NAIL INSERTION Left 09/09/2012   Procedure: INTRAMEDULLARY (IM) NAIL TIBIAL;  Surgeon: Johnny Bridge, MD;  Location: [place];  Service: Orthopedics;  Laterality: Left;  left tibial nail and open reduction internal fixation left fibula fracture    reports that he has never smoked. He has never used smokeless tobacco. He reports that he does not drink alcohol or use drugs. family history includes Coronary artery disease in his other; Diabetes in his mother; Heart attack in his father; Hypertension in his father and  mother. No Known Allergies Current Outpatient Prescriptions on File Prior to Visit  Medication Sig Dispense Refill  . acetaminophen (TYLENOL) 500 MG tablet Take 500 mg by mouth every 6 (six) hours as needed for mild pain.    Derrill Memo ON 11/06/2016] amiodarone (PACERONE) 200 MG tablet Take 1 tablet (200 mg total) by mouth daily. Start on Fri 11/06/16 at 1000 30 tablet 0  . [START ON 10/23/2016] amiodarone (PACERONE) 400 MG tablet Take 1 tablet (400 mg total) by mouth daily. Start on Fri 10/23/16 at 1000, For 14 doses 14 tablet 0  . amiodarone (PACERONE) 400 MG tablet Take 1 tablet (400 mg total) by mouth 2 (two) times daily. Last dose on 09/2916. 25 tablet 0  . atorvastatin (LIPITOR) 40 MG tablet TAKE 1 TABLET(40 MG) BY MOUTH DAILY AT 6 PM 30 tablet 3  . calcium acetate (PHOSLO) 667 MG capsule Take  2,001 mg by mouth 3 (three) times daily with meals.    . cyclobenzaprine (FLEXERIL) 5 MG tablet Take 1 tablet (5 mg total) by mouth 3 (three) times daily as needed for muscle spasms. 60 tablet 1  . ketoconazole (NIZORAL) 2 % cream Apply 1 application topically daily as needed for irritation.    [name] latanoprost (XALATAN) 0.005 % ophthalmic solution Place 1 drop into both eyes at bedtime.     . lidocaine-prilocaine (EMLA) cream APPLY A SMALL AMOUNT TO SKIN AT THE ACCESS SITE (AVF) AS DIRECTED BEFORE EACH DIALYSIS SESSION THREE DAYS A WEEK  6  . QUEtiapine (SEROQUEL) 25 MG tablet Take 1 tablet (25 mg total) by mouth 2 (two) times daily. 60 tablet 5  . SENSIPAR 30 MG tablet Take 30 mg by mouth every evening.     . silver sulfADIAZINE (SILVADENE) 1 % cream Apply 1 application topically daily as needed (irratation).    . triamcinolone cream (KENALOG) 0.1 % Apply 1 application topically as needed (irritation).   2  . VOLTAREN 1 % GEL APPLY 2 GRAMS TO HANDS BID TO TID AS NEEDED FOR PAIN.  0  . warfarin (COUMADIN) 2.5 MG tablet Take 2.5mg  nightly, check INR on 10/05/2016 (Patient taking differently: Take 2.5-5 mg by  mouth See admin instructions. Take 5 mg by mouth on Tuesday and Wednesday only. Then take 3.75 mg by mouth daily on all days of the week except 2.5 mg by mouth on Sunday.) 30 tablet 0   No current facility-administered medications on file prior to visit.    Review of Systems  Constitutional: Negative for unusual diaphoresis or night sweats HENT: Negative for ear swelling or discharge Eyes: Negative for worsening visual haziness  Respiratory: Negative for choking and stridor.   Gastrointestinal: Negative for distension or worsening eructation Genitourinary: Negative for retention or change in urine volume.  Musculoskeletal: Negative for other MSK pain or swelling Skin: Negative for color change and worsening wound Neurological: Negative for tremors and numbness other than noted  Psychiatric/Behavioral: Negative for decreased concentration or agitation other than above   All other system neg per pt    Objective:   Physical Exam BP (!) 144/100   Pulse 84   Temp 98.9 F (37.2 C) (Oral)   Wt 252 lb (114.3 kg) Comment: with wheelchair  SpO2 96%   BMI 36.16 kg/m  VS noted,  Constitutional: Pt appears in no apparent distress HENT: Head: NCAT.  Right Ear: External ear normal.  Left Ear: External ear normal.  Eyes: . Pupils are equal, round, and reactive to light. Conjunctivae and EOM are normal Neck: Normal range of motion. Neck supple.  Cardiovascular: Normal rate and regular rhythm.   Pulmonary/Chest: Effort normal and breath sounds decreased without rales or wheezing.  Abd:  Soft, NT, ND, + BS Neurological: Pt is alert. Not confused , motor grossly intact Skin: Skin is warm. No rash, trace LE edema Psychiatric: Pt behavior is normal. No agitation.   Lab Results  Component Value Date   HGBA1C 6.4 (H) 09/15/2016      Assessment & Plan:

## 2016-10-13 NOTE — Telephone Encounter (Signed)
Recd call from [facility]---reporting critical lab value of creatinine 5.71 and GFR 12.91----also advised dr [name] that message was sent to him---please advise, thanks

## 2016-10-13 NOTE — Patient Instructions (Addendum)
OK to stay off insulin and Blood Pressure pills for now  Please continue all other medications as before, and refills have been done if requested.  Please have the pharmacy call with any other refills you may need.  Please keep your appointments with your specialists as you may have planned  Please go to the LAB in the Basement (turn left off the elevator) for the tests to be done today  You will be contacted by phone if any changes need to be made immediately.  Otherwise, you will receive a letter about your results with an explanation, but please check with MyChart first.  Please remember to sign up for MyChart if you have not done so, as this will be important to you in the future with finding out test results, communicating by private email, and scheduling acute appointments online when needed.

## 2016-10-14 ENCOUNTER — Telehealth: Payer: Self-pay | Admitting: *Deleted

## 2016-10-14 DIAGNOSIS — N186 End stage renal disease: Secondary | ICD-10-CM | POA: Diagnosis not present

## 2016-10-14 DIAGNOSIS — I132 Hypertensive heart and chronic kidney disease with heart failure and with stage 5 chronic kidney disease, or end stage renal disease: Secondary | ICD-10-CM | POA: Diagnosis not present

## 2016-10-14 DIAGNOSIS — I5042 Chronic combined systolic (congestive) and diastolic (congestive) heart failure: Secondary | ICD-10-CM | POA: Diagnosis not present

## 2016-10-14 DIAGNOSIS — I480 Paroxysmal atrial fibrillation: Secondary | ICD-10-CM | POA: Diagnosis not present

## 2016-10-14 DIAGNOSIS — D631 Anemia in chronic kidney disease: Secondary | ICD-10-CM | POA: Diagnosis not present

## 2016-10-14 DIAGNOSIS — E1122 Type 2 diabetes mellitus with diabetic chronic kidney disease: Secondary | ICD-10-CM | POA: Diagnosis not present

## 2016-10-14 MED ORDER — PANTOPRAZOLE SODIUM 40 MG PO TBEC: 40.0000 mg | DELAYED_RELEASE_TABLET | Freq: Two times a day (BID) | ORAL | 11 refills | Status: DC

## 2016-10-14 NOTE — Telephone Encounter (Signed)
Wife left msg on triage stating husband is needing to get refill on the Pantoprazole. Called pt back inform rx has been sent to [facility]...Oscar Castillo

## 2016-10-15 DIAGNOSIS — D631 Anemia in chronic kidney disease: Secondary | ICD-10-CM | POA: Diagnosis not present

## 2016-10-15 DIAGNOSIS — E1122 Type 2 diabetes mellitus with diabetic chronic kidney disease: Secondary | ICD-10-CM | POA: Diagnosis not present

## 2016-10-15 DIAGNOSIS — N186 End stage renal disease: Secondary | ICD-10-CM | POA: Diagnosis not present

## 2016-10-15 DIAGNOSIS — N2581 Secondary hyperparathyroidism of renal origin: Secondary | ICD-10-CM | POA: Diagnosis not present

## 2016-10-16 DIAGNOSIS — H401122 Primary open-angle glaucoma, left eye, moderate stage: Secondary | ICD-10-CM | POA: Diagnosis not present

## 2016-10-16 DIAGNOSIS — H401112 Primary open-angle glaucoma, right eye, moderate stage: Secondary | ICD-10-CM | POA: Diagnosis not present

## 2016-10-17 DIAGNOSIS — N2581 Secondary hyperparathyroidism of renal origin: Secondary | ICD-10-CM | POA: Diagnosis not present

## 2016-10-17 DIAGNOSIS — N186 End stage renal disease: Secondary | ICD-10-CM | POA: Diagnosis not present

## 2016-10-17 DIAGNOSIS — E1122 Type 2 diabetes mellitus with diabetic chronic kidney disease: Secondary | ICD-10-CM | POA: Diagnosis not present

## 2016-10-17 DIAGNOSIS — D631 Anemia in chronic kidney disease: Secondary | ICD-10-CM | POA: Diagnosis not present

## 2016-10-18 NOTE — Assessment & Plan Note (Signed)
Lab Results  Component Value Date   WBC 5.7 10/13/2016   HGB 11.6 (L) 10/13/2016   HCT 35.9 (L) 10/13/2016   MCV 91.1 10/13/2016   PLT 230.0 10/13/2016  stable overall by history and exam, recent data reviewed with pt, and pt to continue medical treatment as before,  to f/u any worsening symptoms or concerns

## 2016-10-18 NOTE — Assessment & Plan Note (Addendum)
Compensated, stable, cont off BP med for now

## 2016-10-18 NOTE — Assessment & Plan Note (Addendum)
Lab Results  Component Value Date   HGBA1C 6.4 (H) 09/15/2016  stable overall by history and exam, recent data reviewed with pt, and pt to remain off insulin for now, o/w cont all other tx,  to f/u any worsening symptoms or concerns

## 2016-10-19 ENCOUNTER — Ambulatory Visit (INDEPENDENT_AMBULATORY_CARE_PROVIDER_SITE_OTHER): Payer: Medicare Other | Admitting: *Deleted

## 2016-10-19 DIAGNOSIS — Z5181 Encounter for therapeutic drug level monitoring: Secondary | ICD-10-CM | POA: Diagnosis not present

## 2016-10-19 DIAGNOSIS — I4891 Unspecified atrial fibrillation: Secondary | ICD-10-CM | POA: Diagnosis not present

## 2016-10-19 DIAGNOSIS — G459 Transient cerebral ischemic attack, unspecified: Secondary | ICD-10-CM | POA: Diagnosis not present

## 2016-10-19 DIAGNOSIS — I5042 Chronic combined systolic (congestive) and diastolic (congestive) heart failure: Secondary | ICD-10-CM | POA: Diagnosis not present

## 2016-10-19 DIAGNOSIS — I638 Other cerebral infarction: Secondary | ICD-10-CM

## 2016-10-19 DIAGNOSIS — N186 End stage renal disease: Secondary | ICD-10-CM | POA: Diagnosis not present

## 2016-10-19 DIAGNOSIS — I480 Paroxysmal atrial fibrillation: Secondary | ICD-10-CM | POA: Diagnosis not present

## 2016-10-19 DIAGNOSIS — I132 Hypertensive heart and chronic kidney disease with heart failure and with stage 5 chronic kidney disease, or end stage renal disease: Secondary | ICD-10-CM | POA: Diagnosis not present

## 2016-10-19 DIAGNOSIS — E1122 Type 2 diabetes mellitus with diabetic chronic kidney disease: Secondary | ICD-10-CM | POA: Diagnosis not present

## 2016-10-19 DIAGNOSIS — D631 Anemia in chronic kidney disease: Secondary | ICD-10-CM | POA: Diagnosis not present

## 2016-10-19 LAB — POCT INR: INR: 4.6

## 2016-10-20 DIAGNOSIS — N186 End stage renal disease: Secondary | ICD-10-CM | POA: Diagnosis not present

## 2016-10-20 DIAGNOSIS — N2581 Secondary hyperparathyroidism of renal origin: Secondary | ICD-10-CM | POA: Diagnosis not present

## 2016-10-20 DIAGNOSIS — D631 Anemia in chronic kidney disease: Secondary | ICD-10-CM | POA: Diagnosis not present

## 2016-10-20 DIAGNOSIS — E1122 Type 2 diabetes mellitus with diabetic chronic kidney disease: Secondary | ICD-10-CM | POA: Diagnosis not present

## 2016-10-21 DIAGNOSIS — N186 End stage renal disease: Secondary | ICD-10-CM | POA: Diagnosis not present

## 2016-10-21 DIAGNOSIS — I132 Hypertensive heart and chronic kidney disease with heart failure and with stage 5 chronic kidney disease, or end stage renal disease: Secondary | ICD-10-CM | POA: Diagnosis not present

## 2016-10-21 DIAGNOSIS — I5042 Chronic combined systolic (congestive) and diastolic (congestive) heart failure: Secondary | ICD-10-CM | POA: Diagnosis not present

## 2016-10-21 DIAGNOSIS — D631 Anemia in chronic kidney disease: Secondary | ICD-10-CM | POA: Diagnosis not present

## 2016-10-21 DIAGNOSIS — I480 Paroxysmal atrial fibrillation: Secondary | ICD-10-CM | POA: Diagnosis not present

## 2016-10-21 DIAGNOSIS — E1122 Type 2 diabetes mellitus with diabetic chronic kidney disease: Secondary | ICD-10-CM | POA: Diagnosis not present

## 2016-10-22 DIAGNOSIS — N186 End stage renal disease: Secondary | ICD-10-CM | POA: Diagnosis not present

## 2016-10-22 DIAGNOSIS — E1122 Type 2 diabetes mellitus with diabetic chronic kidney disease: Secondary | ICD-10-CM | POA: Diagnosis not present

## 2016-10-22 DIAGNOSIS — D631 Anemia in chronic kidney disease: Secondary | ICD-10-CM | POA: Diagnosis not present

## 2016-10-22 DIAGNOSIS — N2581 Secondary hyperparathyroidism of renal origin: Secondary | ICD-10-CM | POA: Diagnosis not present

## 2016-10-22 DIAGNOSIS — I4891 Unspecified atrial fibrillation: Secondary | ICD-10-CM | POA: Diagnosis not present

## 2016-10-22 DIAGNOSIS — Z5181 Encounter for therapeutic drug level monitoring: Secondary | ICD-10-CM | POA: Diagnosis not present

## 2016-10-24 DIAGNOSIS — N186 End stage renal disease: Secondary | ICD-10-CM | POA: Diagnosis not present

## 2016-10-24 DIAGNOSIS — Z992 Dependence on renal dialysis: Secondary | ICD-10-CM | POA: Diagnosis not present

## 2016-10-24 DIAGNOSIS — D631 Anemia in chronic kidney disease: Secondary | ICD-10-CM | POA: Diagnosis not present

## 2016-10-24 DIAGNOSIS — N2581 Secondary hyperparathyroidism of renal origin: Secondary | ICD-10-CM | POA: Diagnosis not present

## 2016-10-24 DIAGNOSIS — E1122 Type 2 diabetes mellitus with diabetic chronic kidney disease: Secondary | ICD-10-CM | POA: Diagnosis not present

## 2016-10-27 DIAGNOSIS — N186 End stage renal disease: Secondary | ICD-10-CM | POA: Diagnosis not present

## 2016-10-27 DIAGNOSIS — E1122 Type 2 diabetes mellitus with diabetic chronic kidney disease: Secondary | ICD-10-CM | POA: Diagnosis not present

## 2016-10-27 DIAGNOSIS — N2581 Secondary hyperparathyroidism of renal origin: Secondary | ICD-10-CM | POA: Diagnosis not present

## 2016-10-27 DIAGNOSIS — R509 Fever, unspecified: Secondary | ICD-10-CM | POA: Diagnosis not present

## 2016-10-27 DIAGNOSIS — D631 Anemia in chronic kidney disease: Secondary | ICD-10-CM | POA: Diagnosis not present

## 2016-10-27 DIAGNOSIS — R7881 Bacteremia: Secondary | ICD-10-CM | POA: Diagnosis not present

## 2016-10-28 ENCOUNTER — Ambulatory Visit (INDEPENDENT_AMBULATORY_CARE_PROVIDER_SITE_OTHER): Payer: Medicare Other | Admitting: *Deleted

## 2016-10-28 DIAGNOSIS — Z5181 Encounter for therapeutic drug level monitoring: Secondary | ICD-10-CM | POA: Diagnosis not present

## 2016-10-28 DIAGNOSIS — N186 End stage renal disease: Secondary | ICD-10-CM | POA: Diagnosis not present

## 2016-10-28 DIAGNOSIS — E1122 Type 2 diabetes mellitus with diabetic chronic kidney disease: Secondary | ICD-10-CM | POA: Diagnosis not present

## 2016-10-28 DIAGNOSIS — I638 Other cerebral infarction: Secondary | ICD-10-CM

## 2016-10-28 DIAGNOSIS — G459 Transient cerebral ischemic attack, unspecified: Secondary | ICD-10-CM | POA: Diagnosis not present

## 2016-10-28 DIAGNOSIS — I4891 Unspecified atrial fibrillation: Secondary | ICD-10-CM | POA: Diagnosis not present

## 2016-10-28 DIAGNOSIS — I480 Paroxysmal atrial fibrillation: Secondary | ICD-10-CM | POA: Diagnosis not present

## 2016-10-28 DIAGNOSIS — D631 Anemia in chronic kidney disease: Secondary | ICD-10-CM | POA: Diagnosis not present

## 2016-10-28 DIAGNOSIS — I5042 Chronic combined systolic (congestive) and diastolic (congestive) heart failure: Secondary | ICD-10-CM | POA: Diagnosis not present

## 2016-10-28 DIAGNOSIS — I132 Hypertensive heart and chronic kidney disease with heart failure and with stage 5 chronic kidney disease, or end stage renal disease: Secondary | ICD-10-CM | POA: Diagnosis not present

## 2016-10-28 LAB — PROTIME-INR
INR: 5.3 — AB (ref 0.8–1.2)
PROTHROMBIN TIME: 53.1 s — AB (ref 9.1–12.0)

## 2016-10-28 LAB — POCT INR: INR: 6.7

## 2016-10-29 DIAGNOSIS — F329 Major depressive disorder, single episode, unspecified: Secondary | ICD-10-CM | POA: Diagnosis not present

## 2016-10-29 DIAGNOSIS — N2581 Secondary hyperparathyroidism of renal origin: Secondary | ICD-10-CM | POA: Diagnosis not present

## 2016-10-29 DIAGNOSIS — Z89511 Acquired absence of right leg below knee: Secondary | ICD-10-CM | POA: Diagnosis not present

## 2016-10-29 DIAGNOSIS — Z89512 Acquired absence of left leg below knee: Secondary | ICD-10-CM | POA: Diagnosis not present

## 2016-10-29 DIAGNOSIS — K219 Gastro-esophageal reflux disease without esophagitis: Secondary | ICD-10-CM | POA: Diagnosis not present

## 2016-10-29 DIAGNOSIS — D631 Anemia in chronic kidney disease: Secondary | ICD-10-CM | POA: Diagnosis not present

## 2016-10-29 DIAGNOSIS — K922 Gastrointestinal hemorrhage, unspecified: Secondary | ICD-10-CM | POA: Diagnosis not present

## 2016-10-29 DIAGNOSIS — R509 Fever, unspecified: Secondary | ICD-10-CM | POA: Diagnosis not present

## 2016-10-29 DIAGNOSIS — E1122 Type 2 diabetes mellitus with diabetic chronic kidney disease: Secondary | ICD-10-CM | POA: Diagnosis not present

## 2016-10-29 DIAGNOSIS — I132 Hypertensive heart and chronic kidney disease with heart failure and with stage 5 chronic kidney disease, or end stage renal disease: Secondary | ICD-10-CM | POA: Diagnosis not present

## 2016-10-29 DIAGNOSIS — N186 End stage renal disease: Secondary | ICD-10-CM | POA: Diagnosis not present

## 2016-10-29 DIAGNOSIS — R7881 Bacteremia: Secondary | ICD-10-CM | POA: Diagnosis not present

## 2016-10-29 DIAGNOSIS — I5042 Chronic combined systolic (congestive) and diastolic (congestive) heart failure: Secondary | ICD-10-CM | POA: Diagnosis not present

## 2016-10-29 DIAGNOSIS — I480 Paroxysmal atrial fibrillation: Secondary | ICD-10-CM | POA: Diagnosis not present

## 2016-10-30 DIAGNOSIS — I480 Paroxysmal atrial fibrillation: Secondary | ICD-10-CM | POA: Diagnosis not present

## 2016-10-30 DIAGNOSIS — Z992 Dependence on renal dialysis: Secondary | ICD-10-CM | POA: Diagnosis not present

## 2016-10-30 DIAGNOSIS — D631 Anemia in chronic kidney disease: Secondary | ICD-10-CM | POA: Diagnosis not present

## 2016-10-30 DIAGNOSIS — E1122 Type 2 diabetes mellitus with diabetic chronic kidney disease: Secondary | ICD-10-CM | POA: Diagnosis not present

## 2016-10-30 DIAGNOSIS — I132 Hypertensive heart and chronic kidney disease with heart failure and with stage 5 chronic kidney disease, or end stage renal disease: Secondary | ICD-10-CM | POA: Diagnosis not present

## 2016-10-30 DIAGNOSIS — I5042 Chronic combined systolic (congestive) and diastolic (congestive) heart failure: Secondary | ICD-10-CM | POA: Diagnosis not present

## 2016-10-30 DIAGNOSIS — N186 End stage renal disease: Secondary | ICD-10-CM | POA: Diagnosis not present

## 2016-10-30 NOTE — Progress Notes (Signed)
Cardiology Office Note    Date:  11/02/2016   ID:  [facility], DOB 05/09/1951, MRN [id number]  PCP:  [name], MD  Cardiologist:  Dr. Harrington Challenger  Chief Complaint: [facility] follow up for afib  History of Present Illness:   Oscar [facility] is a 66 y.o. male ESRD on hemodialysis, chronic combined systolic and diastolic CHF, prior stroke, diabetes, HTN, hepatitis C, paroxysmal AF (on coumadin for CHADSVASC score of 7), status post bilateral BKA's, and LBBB presents for [facility] follow up.   He was admitted in 2017 with AF with RVR. LV function was normal on echocardiogram. He had artifact on his Myoview that was explained by LBBB but no ischemia.   Patient was evaluated in 2/18 for heme positive stools. EGD demonstrated esophagitis, gastritis/duodenitis. He was then admitted 3/3-3/9 with blood loss anemia in the setting of melena and suspected hematemesis. He required transfusion with PRBCs. Capsule [facility] was reportedly unremarkable. Coumadin was resumed at discharge without Lovenox bridging.  He  was noted in AFib with RVR with hypotension when seen in clinic by Richardson Dopp, PA 10/07/16. INR of 1.4. Evaluated by DOD Dr. Marlou Porch and started on IV heparin and IV amiodarone and admitted to [facility]. Converted to sinus rhythm. Discharged on amiodarone 400 mg PO BID for 2 weeks, then 400 mg daily for 2 weeks, then 200 mg daily. Resumed coumadin. Due to soft BP-->  BB and ACE held.    Here today for follow up. SBP in 120-140s at home. The patient denies nausea, vomiting, fever, chest pain, palpitations, shortness of breath, orthopnea, PND, dizziness, syncope, cough, congestion, abdominal pain, hematochezia, melena, lower extremity edema. Compliant with medications.    Past Medical History:  Diagnosis Date  . Anemia   . Antral ulcer 2014   small  . Atrial fibrillation with RVR ([place]) 10/07/2016  . BACK PAIN, LUMBAR, CHRONIC   . BENIGN PROSTATIC HYPERTROPHY   . Bipolar disorder ([facility])    "sometimes" (10/07/2016)  . CHOLELITHIASIS   . Chronic combined systolic and diastolic CHF (congestive heart failure) ([facility])   . Complication of anesthesia    wife states pt had trouble waking up with his last surgery in Nov., 2014  . CVA (cerebral vascular accident) ([facility])   . DEPRESSION   . DIABETES MELLITUS, TYPE II    "not anymore" (10/07/2016)  . ERECTILE DYSFUNCTION   . ESRD (end stage renal disease) on dialysis [facility])    "[place]; TTS" (10/07/2016)  . ESRD on hemodialysis ([facility])    ESRD due to DM/HTN. Started dialysis in November 2013.  HD TTS at [facility] on [address].  [facility] GERD   . GI bleed    due to gastritis, discharged 10/02/16/notes 10/07/2016  . Headache    "monthly" (10/07/2016)  . Hemorrhoids   . Hepatitis C   . History of blood transfusion    "related to dialysis" (10/07/2016)  . History of Clostridium difficile   . History of kidney stones   . HYPERTENSION   . LBBB (left bundle branch block)   . Morbid obesity (Oscar [facility])   . PAF (paroxysmal atrial fibrillation) (Oscar [facility])    a. Dx 12/2015.    Past Surgical History:  Procedure Laterality Date  . AMPUTATION Left 05/12/2013   Procedure: AMPUTATION RAY;  Surgeon: Newt Minion, MD;  Location: [facility];  Service: Orthopedics;  Laterality: Left;  Left Foot 1st Ray Amputation  . AMPUTATION Left 06/09/2013   Procedure: AMPUTATION BELOW KNEE;  Surgeon: Beverely Low  Fernanda Drum, MD;  Location: [facility];  Service: Orthopedics;  Laterality: Left;  Left Below Knee Amputation and removal proximal screws IM tibial nail  . AMPUTATION Right 09/08/2013   Procedure: AMPUTATION BELOW KNEE;  Surgeon: Newt Minion, MD;  Location: [facility];  Service: Orthopedics;  Laterality: Right;  Right Below Knee Amputation  . AMPUTATION Right 10/11/2013   Procedure: AMPUTATION BELOW KNEE;  Surgeon: Newt Minion, MD;  Location: [facility];  Service: Orthopedics;  Laterality: Right;  Right Below Knee Amputation Revision  . AV FISTULA PLACEMENT  06/14/2012   Procedure: ARTERIOVENOUS (AV)  FISTULA CREATION;  Surgeon: Angelia Mould, MD;  Location: [facility];  Service: Vascular;  Laterality: Left;  Left basilic vein transposition with fistula.  . COLONOSCOPY N/A 10/28/2012   Procedure: COLONOSCOPY;  Surgeon: Jeryl Columbia, MD;  Location: [facility];  Service: [facility];  Laterality: N/A;  . COLONOSCOPY N/A 11/02/2012   Procedure: COLONOSCOPY;  Surgeon: Cleotis Nipper, MD;  Location: [facility];  Service: [facility];  Laterality: N/A;  . COLONOSCOPY N/A 11/03/2012   Procedure: COLONOSCOPY;  Surgeon: Cleotis Nipper, MD;  Location: [facility];  Service: [facility];  Laterality: N/A;  . COLONOSCOPY N/A 09/16/2016   Procedure: COLONOSCOPY;  Surgeon: Jerene Bears, MD;  Location: [facility];  Service: Gastroenterology;  Laterality: N/A;  . ENTEROSCOPY N/A 11/08/2012   Procedure: ENTEROSCOPY;  Surgeon: Wonda Horner, MD;  Location: [facility];  Service: [facility];  Laterality: N/A;  . ESOPHAGOGASTRODUODENOSCOPY N/A 11/02/2012   Procedure: ESOPHAGOGASTRODUODENOSCOPY (EGD);  Surgeon: Cleotis Nipper, MD;  [facility];  Service: [facility];  Laterality: N/A;  . ESOPHAGOGASTRODUODENOSCOPY (EGD) WITH PROPOFOL N/A 09/16/2016   Procedure: ESOPHAGOGASTRODUODENOSCOPY (EGD) WITH PROPOFOL;  Surgeon: Jerene Bears, MD;  Location: [facility];  Service: Gastroenterology;  Laterality: N/A;  . EYE SURGERY Left    to remove scar tissue  . FRACTURE SURGERY    . GIVENS CAPSULE STUDY N/A 11/04/2012   Procedure: GIVENS CAPSULE STUDY;  Surgeon: Cleotis Nipper, MD;  Location: [facility];  Service: [facility];  Laterality: N/A;  . GIVENS CAPSULE STUDY N/A 09/29/2016   Procedure: GIVENS CAPSULE STUDY;  Surgeon: Manus Gunning, MD;  Location: [facility];  Service: Gastroenterology;  Laterality: N/A;  try to keep pt up in bedside chair as much as possible during the study.    [facility] HARDWARE REMOVAL Left 06/09/2013   Procedure: HARDWARE REMOVAL;  Surgeon: Newt Minion, MD;  Location: [facility];   Service: Orthopedics;  Laterality: Left;  Left Below Knee Amputation  and Removal proximal screws IM tibial nail  . IR GENERIC HISTORICAL  09/27/2016   IR US GUIDE VASC ACCESS RIGHT 09/27/2016 Aletta Edouard, MD [facility]  . IR GENERIC HISTORICAL  09/27/2016   IR FLUORO GUIDE CV [facility] RIGHT 09/27/2016 Aletta Edouard, MD [facility]  . NEPHRECTOMY     partial RR  . ORIF FIBULA FRACTURE Left 09/09/2012   Procedure: OPEN REDUCTION INTERNAL FIXATION (ORIF) FIBULA FRACTURE;  Surgeon: Johnny Bridge, MD;  Location: [facility];  Service: Orthopedics;  Laterality: Left;  . TIBIA IM NAIL INSERTION Left 09/09/2012   Procedure: INTRAMEDULLARY (IM) NAIL TIBIAL;  Surgeon: Johnny Bridge, MD;  Location: [facility];  Service: Orthopedics;  Laterality: Left;  left tibial nail and open reduction internal fixation left fibula fracture    Current Medications: Prior to Admission medications   Medication Sig Start Date End Date Taking? Authorizing Provider  acetaminophen (TYLENOL) 500 MG tablet Take 500  mg by mouth every 6 (six) hours as needed for mild pain.    Historical Provider, MD  amiodarone (PACERONE) 200 MG tablet Take 1 tablet (200 mg total) by mouth daily. Start on Fri 11/06/16 at 1000 11/06/16   Shanker Kristeen Mans, MD  amiodarone (PACERONE) 400 MG tablet Take 1 tablet (400 mg total) by mouth daily. Start on Fri 10/23/16 at 1000, For 14 doses 10/23/16   Shanker Kristeen Mans, MD  amiodarone (PACERONE) 400 MG tablet Take 1 tablet (400 mg total) by mouth 2 (two) times daily. Last dose on 09/2916. 10/09/16   Jonetta Osgood, MD  atorvastatin (LIPITOR) 40 MG tablet TAKE 1 TABLET(40 MG) BY MOUTH DAILY AT 6 PM 08/10/16   [name], MD  calcium acetate (PHOSLO) 667 MG capsule Take 2,001 mg by mouth 3 (three) times daily with meals.    Historical Provider, MD  cyclobenzaprine (FLEXERIL) 5 MG tablet Take 1 tablet (5 mg total) by mouth 3 (three) times daily as needed for muscle spasms. 07/08/16   [name], MD  ketoconazole  (NIZORAL) 2 % cream Apply 1 application topically daily as needed for irritation. 09/16/16   Barton Dubois, MD  latanoprost (XALATAN) 0.005 % ophthalmic solution Place 1 drop into both eyes at bedtime.  06/12/16   Historical Provider, MD  lidocaine-prilocaine (EMLA) cream APPLY A SMALL AMOUNT TO SKIN AT THE ACCESS SITE (AVF) AS DIRECTED BEFORE EACH DIALYSIS SESSION THREE DAYS A WEEK 06/03/15   Historical Provider, MD  pantoprazole (PROTONIX) 40 MG tablet Take 1 tablet (40 mg total) by mouth 2 (two) times daily before a meal. 10/14/16   [name], MD  QUEtiapine (SEROQUEL) 25 MG tablet Take 1 tablet (25 mg total) by mouth 2 (two) times daily. 04/07/16   [name], MD  SENSIPAR 30 MG tablet Take 30 mg by mouth every evening.  01/09/15   Historical Provider, MD  silver sulfADIAZINE (SILVADENE) 1 % cream Apply 1 application topically daily as needed (irratation). 09/16/16   Barton Dubois, MD  triamcinolone cream (KENALOG) 0.1 % Apply 1 application topically as needed (irritation).  07/17/16   Historical Provider, MD  VOLTAREN 1 % GEL APPLY 2 GRAMS TO HANDS BID TO TID AS NEEDED FOR PAIN. 09/19/15   Historical Provider, MD  warfarin (COUMADIN) 2.5 MG tablet Take 2.5mg  nightly, check INR on 10/05/2016 Patient taking differently: Take 2.5-5 mg by mouth See admin instructions. Take 5 mg by mouth on Tuesday and Wednesday only. Then take 3.75 mg by mouth daily on all days of the week except 2.5 mg by mouth on Sunday. 10/02/16   Maryann Mikhail, DO    Allergies:   Patient has no known allergies.   Social History   Social History  . Marital status: Married    Spouse name: N/A  . Number of children: 2  . Years of education: 16   Occupational History  . disabled due to stroke Retired   Social History Main Topics  . Smoking status: Never Smoker  . Smokeless tobacco: Never Used  . Alcohol use No  . Drug use: No  . Sexual activity: Yes    Birth control/ protection: None   Other Topics Concern  . 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   ROS:   Please see the history of present illness.    ROS All other systems reviewed and are negative.   PHYSICAL EXAM:   VS:  BP (!) 142/81   Pulse 76   Ht 5\' 10"  (1.778 m)   Wt 210 lb (95.3 kg)   BMI 30.13 kg/m    GEN: Well nourished, well developed, in no acute distress  HEENT: normal  Neck: no JVD, carotid bruits, or masses Cardiac: RRR; no murmurs, rubs, or gallops,no edema has prosthesis Respiratory:  clear to auscultation bilaterally, normal work of breathing GI: soft, nontender, nondistended, + BS MS: no deformity or atrophy  Skin: warm and dry, no rash Neuro:  Alert and Oriented x 3, Strength and sensation are intact Psych: euthymic mood, full affect  Wt Readings from Last 3 Encounters:  11/02/16 210 lb (95.3 kg)  10/13/16 252 lb (114.3 kg)  10/12/16 193 lb (87.5 kg)      Studies/Labs Reviewed:   EKG:  EKG is ordered today.  The ekg ordered today demonstrates sinus rhythm with PR interval of 277ms  Recent Labs: 10/07/2016: ALT 8; Magnesium 2.3 10/08/2016: TSH 0.311 10/13/2016: BUN 25; Creatinine, Ser 5.71; Hemoglobin 11.6; Platelets 230.0; Potassium 3.6; Sodium 135   Lipid Panel    Component Value Date/Time   CHOL 102 (L) 05/14/2016 1634   TRIG 61 05/14/2016 1634   HDL 36 (L) 05/14/2016 1634   CHOLHDL 2.8 05/14/2016 1634   VLDL 12 05/14/2016 1634   LDLCALC 54 05/14/2016 1634    Additional studies/ records that were reviewed today include:   Echocardiogram:  Cardiac Catheterization:     ASSESSMENT & PLAN:    1. PAF - Maintaining sinus rhythm. Continue taper dose of amio. Coumadin for anticoagulation per coumadin clinic.   2. Chronic combined  CHF - Volume managed by HD.   3. Hx of recent GI bleeding - Underwent intensive workup including a EGD, colonoscopy and a capsule [facility] which were all negative during his most recent admission (3/3-3/9).  4. Hypertensive heart disease with CHF - metoprolol, hydralazine and diltiazem held during last admission due to hypotension. BP of 142/81. BP SBP of 120-140s at home. Discussed low dose ACE/ARB or amlodipine. He is not interested in starting any regimen. He does not like TID hydralazine dose.  5. HLD - Continue statin   Medication Adjustments/Labs and Tests Ordered: Current medicines are reviewed at length with the patient today.  Concerns regarding medicines are outlined above.  Medication changes, Labs and Tests ordered today are listed in the Patient Instructions below. Patient Instructions  Medication Instructions:  Your physician recommends that you continue on your current medications as directed. Please refer to the Current Medication list given to you today.   Labwork: None ordered  Testing/Procedures: None ordered  Follow-Up: Your physician recommends that you schedule a follow-up appointment in:    Any Other Special Instructions Will Be Listed Below (If Applicable).    If you need a refill on your cardiac medications before your next appointment, please call your pharmacy.     Jarrett Soho, [place]  11/02/2016 11:14 AM    [address], [place], [place]  [address] Phone: [phone]; Fax: [phone]

## 2016-10-31 DIAGNOSIS — E1122 Type 2 diabetes mellitus with diabetic chronic kidney disease: Secondary | ICD-10-CM | POA: Diagnosis not present

## 2016-10-31 DIAGNOSIS — R509 Fever, unspecified: Secondary | ICD-10-CM | POA: Diagnosis not present

## 2016-10-31 DIAGNOSIS — D631 Anemia in chronic kidney disease: Secondary | ICD-10-CM | POA: Diagnosis not present

## 2016-10-31 DIAGNOSIS — N2581 Secondary hyperparathyroidism of renal origin: Secondary | ICD-10-CM | POA: Diagnosis not present

## 2016-10-31 DIAGNOSIS — N186 End stage renal disease: Secondary | ICD-10-CM | POA: Diagnosis not present

## 2016-10-31 DIAGNOSIS — R7881 Bacteremia: Secondary | ICD-10-CM | POA: Diagnosis not present

## 2016-11-02 ENCOUNTER — Ambulatory Visit (INDEPENDENT_AMBULATORY_CARE_PROVIDER_SITE_OTHER): Payer: Medicare Other | Admitting: Physician Assistant

## 2016-11-02 ENCOUNTER — Encounter: Payer: Self-pay | Admitting: Physician Assistant

## 2016-11-02 ENCOUNTER — Encounter (INDEPENDENT_AMBULATORY_CARE_PROVIDER_SITE_OTHER): Payer: Self-pay

## 2016-11-02 ENCOUNTER — Other Ambulatory Visit: Payer: Self-pay | Admitting: Physician Assistant

## 2016-11-02 VITALS — BP 142/81 | HR 76 | Ht 70.0 in | Wt 210.0 lb

## 2016-11-02 DIAGNOSIS — I4891 Unspecified atrial fibrillation: Secondary | ICD-10-CM

## 2016-11-02 DIAGNOSIS — E1122 Type 2 diabetes mellitus with diabetic chronic kidney disease: Secondary | ICD-10-CM | POA: Diagnosis not present

## 2016-11-02 DIAGNOSIS — E785 Hyperlipidemia, unspecified: Secondary | ICD-10-CM | POA: Diagnosis not present

## 2016-11-02 DIAGNOSIS — I5042 Chronic combined systolic (congestive) and diastolic (congestive) heart failure: Secondary | ICD-10-CM

## 2016-11-02 DIAGNOSIS — D631 Anemia in chronic kidney disease: Secondary | ICD-10-CM | POA: Diagnosis not present

## 2016-11-02 DIAGNOSIS — I132 Hypertensive heart and chronic kidney disease with heart failure and with stage 5 chronic kidney disease, or end stage renal disease: Secondary | ICD-10-CM | POA: Diagnosis not present

## 2016-11-02 DIAGNOSIS — N186 End stage renal disease: Secondary | ICD-10-CM | POA: Diagnosis not present

## 2016-11-02 DIAGNOSIS — I110 Hypertensive heart disease with heart failure: Secondary | ICD-10-CM

## 2016-11-02 DIAGNOSIS — I480 Paroxysmal atrial fibrillation: Secondary | ICD-10-CM

## 2016-11-02 DIAGNOSIS — I638 Other cerebral infarction: Secondary | ICD-10-CM

## 2016-11-02 MED ORDER — AMIODARONE HCL 200 MG PO TABS: 200.0000 mg | ORAL_TABLET | Freq: Every day | ORAL | 11 refills | Status: DC

## 2016-11-02 NOTE — Patient Instructions (Addendum)
Medication Instructions:  Your physician has recommended you make the following change in your medication:  1.  START Amiodarone 200 mg taking 1 tablet daily on 11/06/16  Labwork: None ordered  Testing/Procedures: None ordered  Follow-Up: Your physician recommends that you schedule a follow-up appointment in: [place] DR. ROSS   Any Other Special Instructions Will Be Listed Below (If Applicable).    If you need a refill on your cardiac medications before your next appointment, please call your pharmacy.

## 2016-11-03 DIAGNOSIS — D631 Anemia in chronic kidney disease: Secondary | ICD-10-CM | POA: Diagnosis not present

## 2016-11-03 DIAGNOSIS — R7881 Bacteremia: Secondary | ICD-10-CM | POA: Diagnosis not present

## 2016-11-03 DIAGNOSIS — N2581 Secondary hyperparathyroidism of renal origin: Secondary | ICD-10-CM | POA: Diagnosis not present

## 2016-11-03 DIAGNOSIS — E1122 Type 2 diabetes mellitus with diabetic chronic kidney disease: Secondary | ICD-10-CM | POA: Diagnosis not present

## 2016-11-03 DIAGNOSIS — R509 Fever, unspecified: Secondary | ICD-10-CM | POA: Diagnosis not present

## 2016-11-03 DIAGNOSIS — N186 End stage renal disease: Secondary | ICD-10-CM | POA: Diagnosis not present

## 2016-11-04 ENCOUNTER — Ambulatory Visit (INDEPENDENT_AMBULATORY_CARE_PROVIDER_SITE_OTHER): Payer: Medicare Other | Admitting: *Deleted

## 2016-11-04 DIAGNOSIS — D631 Anemia in chronic kidney disease: Secondary | ICD-10-CM | POA: Diagnosis not present

## 2016-11-04 DIAGNOSIS — I480 Paroxysmal atrial fibrillation: Secondary | ICD-10-CM | POA: Diagnosis not present

## 2016-11-04 DIAGNOSIS — G459 Transient cerebral ischemic attack, unspecified: Secondary | ICD-10-CM

## 2016-11-04 DIAGNOSIS — I5042 Chronic combined systolic (congestive) and diastolic (congestive) heart failure: Secondary | ICD-10-CM | POA: Diagnosis not present

## 2016-11-04 DIAGNOSIS — I638 Other cerebral infarction: Secondary | ICD-10-CM

## 2016-11-04 DIAGNOSIS — I4891 Unspecified atrial fibrillation: Secondary | ICD-10-CM

## 2016-11-04 DIAGNOSIS — Z5181 Encounter for therapeutic drug level monitoring: Secondary | ICD-10-CM

## 2016-11-04 DIAGNOSIS — E1122 Type 2 diabetes mellitus with diabetic chronic kidney disease: Secondary | ICD-10-CM | POA: Diagnosis not present

## 2016-11-04 DIAGNOSIS — N186 End stage renal disease: Secondary | ICD-10-CM | POA: Diagnosis not present

## 2016-11-04 DIAGNOSIS — I132 Hypertensive heart and chronic kidney disease with heart failure and with stage 5 chronic kidney disease, or end stage renal disease: Secondary | ICD-10-CM | POA: Diagnosis not present

## 2016-11-04 LAB — POCT INR: INR: 3.9

## 2016-11-05 DIAGNOSIS — E1122 Type 2 diabetes mellitus with diabetic chronic kidney disease: Secondary | ICD-10-CM | POA: Diagnosis not present

## 2016-11-05 DIAGNOSIS — R7881 Bacteremia: Secondary | ICD-10-CM | POA: Diagnosis not present

## 2016-11-05 DIAGNOSIS — R509 Fever, unspecified: Secondary | ICD-10-CM | POA: Diagnosis not present

## 2016-11-05 DIAGNOSIS — N186 End stage renal disease: Secondary | ICD-10-CM | POA: Diagnosis not present

## 2016-11-05 DIAGNOSIS — D631 Anemia in chronic kidney disease: Secondary | ICD-10-CM | POA: Diagnosis not present

## 2016-11-05 DIAGNOSIS — N2581 Secondary hyperparathyroidism of renal origin: Secondary | ICD-10-CM | POA: Diagnosis not present

## 2016-11-07 ENCOUNTER — Inpatient Hospital Stay (HOSPITAL_COMMUNITY)
Admission: EM | Admit: 2016-11-07 | Discharge: 2016-11-14 | DRG: 252 | Disposition: A | Discharge status: Home Health | Payer: Medicare Other | Attending: Family Medicine | Admitting: Family Medicine

## 2016-11-07 ENCOUNTER — Encounter (HOSPITAL_COMMUNITY): Payer: Self-pay | Admitting: Emergency Medicine

## 2016-11-07 DIAGNOSIS — R7881 Bacteremia: Secondary | ICD-10-CM

## 2016-11-07 DIAGNOSIS — D631 Anemia in chronic kidney disease: Secondary | ICD-10-CM | POA: Diagnosis present

## 2016-11-07 DIAGNOSIS — I132 Hypertensive heart and chronic kidney disease with heart failure and with stage 5 chronic kidney disease, or end stage renal disease: Secondary | ICD-10-CM | POA: Diagnosis present

## 2016-11-07 DIAGNOSIS — E7800 Pure hypercholesterolemia, unspecified: Secondary | ICD-10-CM | POA: Diagnosis present

## 2016-11-07 DIAGNOSIS — Z7901 Long term (current) use of anticoagulants: Secondary | ICD-10-CM

## 2016-11-07 DIAGNOSIS — Z794 Long term (current) use of insulin: Secondary | ICD-10-CM

## 2016-11-07 DIAGNOSIS — B9561 Methicillin susceptible Staphylococcus aureus infection as the cause of diseases classified elsewhere: Secondary | ICD-10-CM

## 2016-11-07 DIAGNOSIS — M898X9 Other specified disorders of bone, unspecified site: Secondary | ICD-10-CM | POA: Diagnosis present

## 2016-11-07 DIAGNOSIS — Z7984 Long term (current) use of oral hypoglycemic drugs: Secondary | ICD-10-CM

## 2016-11-07 DIAGNOSIS — R6521 Severe sepsis with septic shock: Secondary | ICD-10-CM

## 2016-11-07 DIAGNOSIS — I110 Hypertensive heart disease with heart failure: Secondary | ICD-10-CM | POA: Diagnosis present

## 2016-11-07 DIAGNOSIS — N186 End stage renal disease: Secondary | ICD-10-CM | POA: Diagnosis present

## 2016-11-07 DIAGNOSIS — N2581 Secondary hyperparathyroidism of renal origin: Secondary | ICD-10-CM | POA: Diagnosis not present

## 2016-11-07 DIAGNOSIS — Z8249 Family history of ischemic heart disease and other diseases of the circulatory system: Secondary | ICD-10-CM

## 2016-11-07 DIAGNOSIS — I5042 Chronic combined systolic (congestive) and diastolic (congestive) heart failure: Secondary | ICD-10-CM | POA: Diagnosis present

## 2016-11-07 DIAGNOSIS — Z419 Encounter for procedure for purposes other than remedying health state, unspecified: Secondary | ICD-10-CM

## 2016-11-07 DIAGNOSIS — Z79899 Other long term (current) drug therapy: Secondary | ICD-10-CM

## 2016-11-07 DIAGNOSIS — E1122 Type 2 diabetes mellitus with diabetic chronic kidney disease: Secondary | ICD-10-CM | POA: Diagnosis not present

## 2016-11-07 DIAGNOSIS — M545 Low back pain: Secondary | ICD-10-CM | POA: Diagnosis present

## 2016-11-07 DIAGNOSIS — I480 Paroxysmal atrial fibrillation: Secondary | ICD-10-CM | POA: Diagnosis present

## 2016-11-07 DIAGNOSIS — N185 Chronic kidney disease, stage 5: Secondary | ICD-10-CM

## 2016-11-07 DIAGNOSIS — Z993 Dependence on wheelchair: Secondary | ICD-10-CM

## 2016-11-07 DIAGNOSIS — Z89511 Acquired absence of right leg below knee: Secondary | ICD-10-CM

## 2016-11-07 DIAGNOSIS — I5043 Acute on chronic combined systolic (congestive) and diastolic (congestive) heart failure: Secondary | ICD-10-CM | POA: Diagnosis present

## 2016-11-07 DIAGNOSIS — Z992 Dependence on renal dialysis: Secondary | ICD-10-CM

## 2016-11-07 DIAGNOSIS — E1151 Type 2 diabetes mellitus with diabetic peripheral angiopathy without gangrene: Secondary | ICD-10-CM | POA: Diagnosis present

## 2016-11-07 DIAGNOSIS — A419 Sepsis, unspecified organism: Secondary | ICD-10-CM | POA: Diagnosis not present

## 2016-11-07 DIAGNOSIS — K219 Gastro-esophageal reflux disease without esophagitis: Secondary | ICD-10-CM | POA: Diagnosis present

## 2016-11-07 DIAGNOSIS — Z01818 Encounter for other preprocedural examination: Secondary | ICD-10-CM

## 2016-11-07 DIAGNOSIS — F319 Bipolar disorder, unspecified: Secondary | ICD-10-CM | POA: Diagnosis present

## 2016-11-07 DIAGNOSIS — A4101 Sepsis due to Methicillin susceptible Staphylococcus aureus: Secondary | ICD-10-CM | POA: Diagnosis present

## 2016-11-07 DIAGNOSIS — Z89512 Acquired absence of left leg below knee: Secondary | ICD-10-CM

## 2016-11-07 DIAGNOSIS — R531 Weakness: Secondary | ICD-10-CM | POA: Diagnosis not present

## 2016-11-07 DIAGNOSIS — R509 Fever, unspecified: Secondary | ICD-10-CM | POA: Diagnosis not present

## 2016-11-07 DIAGNOSIS — Z8673 Personal history of transient ischemic attack (TIA), and cerebral infarction without residual deficits: Secondary | ICD-10-CM

## 2016-11-07 DIAGNOSIS — I959 Hypotension, unspecified: Secondary | ICD-10-CM | POA: Diagnosis not present

## 2016-11-07 DIAGNOSIS — I82C11 Acute embolism and thrombosis of right internal jugular vein: Secondary | ICD-10-CM | POA: Diagnosis present

## 2016-11-07 DIAGNOSIS — Z452 Encounter for adjustment and management of vascular access device: Secondary | ICD-10-CM

## 2016-11-07 DIAGNOSIS — T827XXA Infection and inflammatory reaction due to other cardiac and vascular devices, implants and grafts, initial encounter: Principal | ICD-10-CM | POA: Diagnosis present

## 2016-11-07 DIAGNOSIS — Z905 Acquired absence of kidney: Secondary | ICD-10-CM

## 2016-11-07 DIAGNOSIS — I4892 Unspecified atrial flutter: Secondary | ICD-10-CM | POA: Diagnosis present

## 2016-11-07 NOTE — ED Triage Notes (Signed)
Pt presents to ED for generalized malaise and fatigue x 2 days.  Pt went to dialysis today and was told her had a mild fever.  Pt diaphoretic at home, weak, unable to keep head up in triage, wife states generalized weakness, especially when changing positions.

## 2016-11-08 ENCOUNTER — Emergency Department (HOSPITAL_COMMUNITY): Payer: Medicare Other

## 2016-11-08 ENCOUNTER — Encounter (HOSPITAL_COMMUNITY): Payer: Self-pay | Admitting: Family Medicine

## 2016-11-08 ENCOUNTER — Inpatient Hospital Stay (HOSPITAL_COMMUNITY): Payer: Medicare Other

## 2016-11-08 DIAGNOSIS — E1129 Type 2 diabetes mellitus with other diabetic kidney complication: Secondary | ICD-10-CM | POA: Diagnosis not present

## 2016-11-08 DIAGNOSIS — R509 Fever, unspecified: Secondary | ICD-10-CM | POA: Diagnosis not present

## 2016-11-08 DIAGNOSIS — A4101 Sepsis due to Methicillin susceptible Staphylococcus aureus: Secondary | ICD-10-CM | POA: Diagnosis not present

## 2016-11-08 DIAGNOSIS — I70208 Unspecified atherosclerosis of native arteries of extremities, other extremity: Secondary | ICD-10-CM | POA: Diagnosis not present

## 2016-11-08 DIAGNOSIS — M545 Low back pain: Secondary | ICD-10-CM | POA: Diagnosis present

## 2016-11-08 DIAGNOSIS — N185 Chronic kidney disease, stage 5: Secondary | ICD-10-CM | POA: Diagnosis not present

## 2016-11-08 DIAGNOSIS — A419 Sepsis, unspecified organism: Secondary | ICD-10-CM | POA: Diagnosis present

## 2016-11-08 DIAGNOSIS — I509 Heart failure, unspecified: Secondary | ICD-10-CM | POA: Diagnosis not present

## 2016-11-08 DIAGNOSIS — Z452 Encounter for adjustment and management of vascular access device: Secondary | ICD-10-CM | POA: Diagnosis not present

## 2016-11-08 DIAGNOSIS — R6521 Severe sepsis with septic shock: Secondary | ICD-10-CM | POA: Diagnosis not present

## 2016-11-08 DIAGNOSIS — K219 Gastro-esophageal reflux disease without esophagitis: Secondary | ICD-10-CM | POA: Diagnosis present

## 2016-11-08 DIAGNOSIS — N186 End stage renal disease: Secondary | ICD-10-CM | POA: Diagnosis not present

## 2016-11-08 DIAGNOSIS — Z01818 Encounter for other preprocedural examination: Secondary | ICD-10-CM | POA: Diagnosis not present

## 2016-11-08 DIAGNOSIS — T82898A Other specified complication of vascular prosthetic devices, implants and grafts, initial encounter: Secondary | ICD-10-CM | POA: Diagnosis not present

## 2016-11-08 DIAGNOSIS — Z7901 Long term (current) use of anticoagulants: Secondary | ICD-10-CM | POA: Diagnosis not present

## 2016-11-08 DIAGNOSIS — I82C11 Acute embolism and thrombosis of right internal jugular vein: Secondary | ICD-10-CM | POA: Diagnosis not present

## 2016-11-08 DIAGNOSIS — I480 Paroxysmal atrial fibrillation: Secondary | ICD-10-CM | POA: Diagnosis not present

## 2016-11-08 DIAGNOSIS — E7800 Pure hypercholesterolemia, unspecified: Secondary | ICD-10-CM | POA: Diagnosis present

## 2016-11-08 DIAGNOSIS — R7881 Bacteremia: Secondary | ICD-10-CM | POA: Diagnosis not present

## 2016-11-08 DIAGNOSIS — R531 Weakness: Secondary | ICD-10-CM | POA: Diagnosis not present

## 2016-11-08 DIAGNOSIS — I5042 Chronic combined systolic (congestive) and diastolic (congestive) heart failure: Secondary | ICD-10-CM

## 2016-11-08 DIAGNOSIS — I517 Cardiomegaly: Secondary | ICD-10-CM | POA: Diagnosis not present

## 2016-11-08 DIAGNOSIS — I110 Hypertensive heart disease with heart failure: Secondary | ICD-10-CM | POA: Diagnosis not present

## 2016-11-08 DIAGNOSIS — I120 Hypertensive chronic kidney disease with stage 5 chronic kidney disease or end stage renal disease: Secondary | ICD-10-CM | POA: Diagnosis not present

## 2016-11-08 DIAGNOSIS — E119 Type 2 diabetes mellitus without complications: Secondary | ICD-10-CM | POA: Diagnosis not present

## 2016-11-08 DIAGNOSIS — Z794 Long term (current) use of insulin: Secondary | ICD-10-CM | POA: Diagnosis not present

## 2016-11-08 DIAGNOSIS — Z905 Acquired absence of kidney: Secondary | ICD-10-CM | POA: Diagnosis not present

## 2016-11-08 DIAGNOSIS — Z7984 Long term (current) use of oral hypoglycemic drugs: Secondary | ICD-10-CM | POA: Diagnosis not present

## 2016-11-08 DIAGNOSIS — E1122 Type 2 diabetes mellitus with diabetic chronic kidney disease: Secondary | ICD-10-CM | POA: Diagnosis not present

## 2016-11-08 DIAGNOSIS — T827XXA Infection and inflammatory reaction due to other cardiac and vascular devices, implants and grafts, initial encounter: Secondary | ICD-10-CM | POA: Diagnosis not present

## 2016-11-08 DIAGNOSIS — F319 Bipolar disorder, unspecified: Secondary | ICD-10-CM | POA: Diagnosis present

## 2016-11-08 DIAGNOSIS — I4892 Unspecified atrial flutter: Secondary | ICD-10-CM | POA: Diagnosis not present

## 2016-11-08 DIAGNOSIS — Z8673 Personal history of transient ischemic attack (TIA), and cerebral infarction without residual deficits: Secondary | ICD-10-CM | POA: Diagnosis not present

## 2016-11-08 DIAGNOSIS — Z89512 Acquired absence of left leg below knee: Secondary | ICD-10-CM | POA: Diagnosis not present

## 2016-11-08 DIAGNOSIS — Z992 Dependence on renal dialysis: Secondary | ICD-10-CM | POA: Diagnosis not present

## 2016-11-08 DIAGNOSIS — D631 Anemia in chronic kidney disease: Secondary | ICD-10-CM | POA: Diagnosis not present

## 2016-11-08 DIAGNOSIS — Z8249 Family history of ischemic heart disease and other diseases of the circulatory system: Secondary | ICD-10-CM | POA: Diagnosis not present

## 2016-11-08 DIAGNOSIS — Z833 Family history of diabetes mellitus: Secondary | ICD-10-CM | POA: Diagnosis not present

## 2016-11-08 DIAGNOSIS — Z79899 Other long term (current) drug therapy: Secondary | ICD-10-CM | POA: Diagnosis not present

## 2016-11-08 DIAGNOSIS — Z89511 Acquired absence of right leg below knee: Secondary | ICD-10-CM | POA: Diagnosis not present

## 2016-11-08 DIAGNOSIS — I132 Hypertensive heart and chronic kidney disease with heart failure and with stage 5 chronic kidney disease, or end stage renal disease: Secondary | ICD-10-CM | POA: Diagnosis not present

## 2016-11-08 DIAGNOSIS — N2581 Secondary hyperparathyroidism of renal origin: Secondary | ICD-10-CM | POA: Diagnosis not present

## 2016-11-08 LAB — CBC WITH DIFFERENTIAL/PLATELET
Basophils Absolute: 0 10*3/uL (ref 0.0–0.1)
Basophils Relative: 0 %
EOS ABS: 0.2 10*3/uL (ref 0.0–0.7)
Eosinophils Relative: 3 %
HCT: 40 % (ref 39.0–52.0)
HEMOGLOBIN: 12.9 g/dL — AB (ref 13.0–17.0)
LYMPHS ABS: 0.7 10*3/uL (ref 0.7–4.0)
LYMPHS PCT: 11 %
MCH: 30 pg (ref 26.0–34.0)
MCHC: 32.3 g/dL (ref 30.0–36.0)
MCV: 93 fL (ref 78.0–100.0)
Monocytes Absolute: 0.6 10*3/uL (ref 0.1–1.0)
Monocytes Relative: 9 %
NEUTROS ABS: 4.7 10*3/uL (ref 1.7–7.7)
NEUTROS PCT: 77 %
Platelets: 147 10*3/uL — ABNORMAL LOW (ref 150–400)
RBC: 4.3 MIL/uL (ref 4.22–5.81)
RDW: 18.9 % — ABNORMAL HIGH (ref 11.5–15.5)
WBC: 6.1 10*3/uL (ref 4.0–10.5)

## 2016-11-08 LAB — MRSA PCR SCREENING: MRSA by PCR: NEGATIVE

## 2016-11-08 LAB — BLOOD CULTURE ID PANEL (REFLEXED)
ACINETOBACTER BAUMANNII: NOT DETECTED
CANDIDA ALBICANS: NOT DETECTED
CANDIDA TROPICALIS: NOT DETECTED
Candida glabrata: NOT DETECTED
Candida krusei: NOT DETECTED
Candida parapsilosis: NOT DETECTED
ENTEROBACTER CLOACAE COMPLEX: NOT DETECTED
ENTEROBACTERIACEAE SPECIES: NOT DETECTED
ENTEROCOCCUS SPECIES: NOT DETECTED
ESCHERICHIA COLI: NOT DETECTED
Haemophilus influenzae: NOT DETECTED
Klebsiella oxytoca: NOT DETECTED
Klebsiella pneumoniae: NOT DETECTED
Listeria monocytogenes: NOT DETECTED
METHICILLIN RESISTANCE: NOT DETECTED
NEISSERIA MENINGITIDIS: NOT DETECTED
Proteus species: NOT DETECTED
Pseudomonas aeruginosa: NOT DETECTED
SERRATIA MARCESCENS: NOT DETECTED
STAPHYLOCOCCUS AUREUS BCID: DETECTED — AB
STAPHYLOCOCCUS SPECIES: DETECTED — AB
STREPTOCOCCUS PYOGENES: NOT DETECTED
STREPTOCOCCUS SPECIES: NOT DETECTED
Streptococcus agalactiae: NOT DETECTED
Streptococcus pneumoniae: NOT DETECTED

## 2016-11-08 LAB — COMPREHENSIVE METABOLIC PANEL
ALT: 12 U/L — AB (ref 17–63)
ANION GAP: 16 — AB (ref 5–15)
AST: 17 U/L (ref 15–41)
Albumin: 3.3 g/dL — ABNORMAL LOW (ref 3.5–5.0)
Alkaline Phosphatase: 44 U/L (ref 38–126)
BUN: 32 mg/dL — ABNORMAL HIGH (ref 6–20)
CHLORIDE: 93 mmol/L — AB (ref 101–111)
CO2: 26 mmol/L (ref 22–32)
CREATININE: 8.26 mg/dL — AB (ref 0.61–1.24)
Calcium: 8.9 mg/dL (ref 8.9–10.3)
GFR calc non Af Amer: 6 mL/min — ABNORMAL LOW (ref >60)
GFR, EST AFRICAN AMERICAN: 7 mL/min — AB (ref >60)
Glucose, Bld: 201 mg/dL — ABNORMAL HIGH (ref 65–99)
Potassium: 4 mmol/L (ref 3.5–5.1)
SODIUM: 135 mmol/L (ref 135–145)
Total Bilirubin: 0.8 mg/dL (ref 0.3–1.2)
Total Protein: 7.4 g/dL (ref 6.5–8.1)

## 2016-11-08 LAB — GLUCOSE, CAPILLARY
Glucose-Capillary: 117 mg/dL — ABNORMAL HIGH (ref 65–99)
Glucose-Capillary: 146 mg/dL — ABNORMAL HIGH (ref 65–99)
Glucose-Capillary: 166 mg/dL — ABNORMAL HIGH (ref 65–99)
Glucose-Capillary: 152 mg/dL — ABNORMAL HIGH (ref 65–99)

## 2016-11-08 LAB — LACTIC ACID, PLASMA: Lactic Acid, Venous: 1.2 mmol/L (ref 0.5–1.9)

## 2016-11-08 LAB — TROPONIN I: Troponin I: 0.03 ng/mL (ref <0.03)

## 2016-11-08 LAB — I-STAT CG4 LACTIC ACID, ED
Lactic Acid, Venous: 1.76 mmol/L (ref 0.5–1.9)
Lactic Acid, Venous: 2.14 mmol/L (ref 0.5–1.9)

## 2016-11-08 LAB — PROTIME-INR
INR: 2.65
PROTHROMBIN TIME: 28.8 s — AB (ref 11.4–15.2)

## 2016-11-08 MED ORDER — CEFAZOLIN SODIUM-DEXTROSE 2-4 GM/100ML-% IV SOLN
2.0000 g | Freq: Once | INTRAVENOUS | Status: AC
Start: 2016-11-08 — End: 2016-11-08
  Administered 2016-11-08: 2 g via INTRAVENOUS
  Filled 2016-11-08: qty 100

## 2016-11-08 MED ORDER — ATORVASTATIN CALCIUM 40 MG PO TABS
40.0000 mg | ORAL_TABLET | Freq: Every day | ORAL | Status: DC
  Administered 2016-11-08 – 2016-11-13 (×4): 40 mg via ORAL
  Filled 2016-11-08 (×4): qty 1

## 2016-11-08 MED ORDER — PIPERACILLIN-TAZOBACTAM 3.375 G IVPB
3.3750 g | Freq: Two times a day (BID) | INTRAVENOUS | Status: DC
  Administered 2016-11-08: 3.375 g via INTRAVENOUS
  Filled 2016-11-08 (×2): qty 50

## 2016-11-08 MED ORDER — CALCIUM ACETATE (PHOS BINDER) 667 MG PO CAPS
2001.0000 mg | ORAL_CAPSULE | Freq: Three times a day (TID) | ORAL | Status: DC
  Administered 2016-11-08 – 2016-11-10 (×4): 2001 mg via ORAL
  Filled 2016-11-08 (×4): qty 3

## 2016-11-08 MED ORDER — VANCOMYCIN HCL IN DEXTROSE 1-5 GM/200ML-% IV SOLN
1000.0000 mg | Freq: Once | INTRAVENOUS | Status: AC
  Administered 2016-11-08: 1000 mg via INTRAVENOUS

## 2016-11-08 MED ORDER — CEFAZOLIN SODIUM-DEXTROSE 2-4 GM/100ML-% IV SOLN
2.0000 g | INTRAVENOUS | Status: DC
  Administered 2016-11-10 – 2016-11-14 (×3): 2 g via INTRAVENOUS
  Filled 2016-11-08 (×5): qty 100

## 2016-11-08 MED ORDER — WARFARIN SODIUM 7.5 MG PO TABS: 3.7500 mg | ORAL_TABLET | ORAL | Status: DC

## 2016-11-08 MED ORDER — SODIUM CHLORIDE 0.9 % IV BOLUS (SEPSIS): 1000.0000 mL | Freq: Once | INTRAVENOUS | Status: DC

## 2016-11-08 MED ORDER — SODIUM CHLORIDE 0.9% FLUSH
3.0000 mL | Freq: Two times a day (BID) | INTRAVENOUS | Status: DC
  Administered 2016-11-08 – 2016-11-11 (×7): 3 mL via INTRAVENOUS
  Administered 2016-11-11: 21:00:00 via INTRAVENOUS
  Administered 2016-11-12 – 2016-11-14 (×5): 3 mL via INTRAVENOUS

## 2016-11-08 MED ORDER — VANCOMYCIN HCL 10 G IV SOLR
1500.0000 mg | Freq: Once | INTRAVENOUS | Status: DC
  Filled 2016-11-08: qty 1500

## 2016-11-08 MED ORDER — RENA-VITE PO TABS
1.0000 | ORAL_TABLET | Freq: Every day | ORAL | Status: DC
  Administered 2016-11-08 – 2016-11-13 (×5): 1 via ORAL
  Filled 2016-11-08 (×5): qty 1

## 2016-11-08 MED ORDER — VANCOMYCIN HCL IN DEXTROSE 1-5 GM/200ML-% IV SOLN: 1000.0000 mg | INTRAVENOUS | Status: DC

## 2016-11-08 MED ORDER — PIPERACILLIN-TAZOBACTAM 3.375 G IVPB 30 MIN
3.3750 g | Freq: Once | INTRAVENOUS | Status: AC
  Administered 2016-11-08: 3.375 g via INTRAVENOUS
  Filled 2016-11-08: qty 50

## 2016-11-08 MED ORDER — INSULIN ASPART 100 UNIT/ML ~~LOC~~ SOLN: 0.0000 [IU] | Freq: Every day | SUBCUTANEOUS | Status: DC

## 2016-11-08 MED ORDER — HEPARIN SODIUM (PORCINE) 5000 UNIT/ML IJ SOLN: 5000.0000 [IU] | Freq: Three times a day (TID) | INTRAMUSCULAR | Status: DC

## 2016-11-08 MED ORDER — WARFARIN - PHARMACIST DOSING INPATIENT: Freq: Every day | Status: DC

## 2016-11-08 MED ORDER — PANTOPRAZOLE SODIUM 40 MG PO TBEC
40.0000 mg | DELAYED_RELEASE_TABLET | Freq: Two times a day (BID) | ORAL | Status: DC
  Administered 2016-11-08 – 2016-11-13 (×8): 40 mg via ORAL
  Filled 2016-11-08 (×9): qty 1

## 2016-11-08 MED ORDER — SODIUM CHLORIDE 0.9 % IV SOLN
Freq: Once | INTRAVENOUS | Status: AC
  Administered 2016-11-08: 10 mL/h via INTRAVENOUS

## 2016-11-08 MED ORDER — VANCOMYCIN HCL 500 MG IV SOLR
500.0000 mg | Freq: Once | INTRAVENOUS | Status: AC
  Administered 2016-11-08: 500 mg via INTRAVENOUS
  Filled 2016-11-08 (×2): qty 500

## 2016-11-08 MED ORDER — SODIUM CHLORIDE 0.9 % IV BOLUS (SEPSIS)
1000.0000 mL | Freq: Once | INTRAVENOUS | Status: AC
  Administered 2016-11-08: 1000 mL via INTRAVENOUS

## 2016-11-08 MED ORDER — AMIODARONE HCL 200 MG PO TABS
200.0000 mg | ORAL_TABLET | Freq: Every day | ORAL | Status: DC
  Administered 2016-11-08 – 2016-11-14 (×7): 200 mg via ORAL
  Filled 2016-11-08 (×7): qty 1

## 2016-11-08 MED ORDER — VANCOMYCIN HCL IN DEXTROSE 1-5 GM/200ML-% IV SOLN
1000.0000 mg | Freq: Once | INTRAVENOUS | Status: DC
  Filled 2016-11-08: qty 200

## 2016-11-08 MED ORDER — VANCOMYCIN HCL IN DEXTROSE 750-5 MG/150ML-% IV SOLN: 750.0000 mg | INTRAVENOUS | Status: DC

## 2016-11-08 MED ORDER — CINACALCET HCL 30 MG PO TABS
30.0000 mg | ORAL_TABLET | Freq: Every evening | ORAL | Status: DC
  Administered 2016-11-08 – 2016-11-13 (×4): 30 mg via ORAL
  Filled 2016-11-08 (×4): qty 1

## 2016-11-08 MED ORDER — INSULIN ASPART 100 UNIT/ML ~~LOC~~ SOLN
0.0000 [IU] | Freq: Three times a day (TID) | SUBCUTANEOUS | Status: DC
  Administered 2016-11-08: 1 [IU] via SUBCUTANEOUS

## 2016-11-08 MED ORDER — DOXERCALCIFEROL 4 MCG/2ML IV SOLN
2.0000 ug | INTRAVENOUS | Status: DC
  Administered 2016-11-10 – 2016-11-14 (×3): 2 ug via INTRAVENOUS
  Filled 2016-11-08 (×3): qty 2

## 2016-11-08 MED ORDER — WARFARIN SODIUM 2.5 MG PO TABS: 2.5000 mg | ORAL_TABLET | ORAL | Status: DC

## 2016-11-08 MED ORDER — NEPRO/CARBSTEADY PO LIQD
237.0000 mL | Freq: Two times a day (BID) | ORAL | Status: DC
  Administered 2016-11-08 – 2016-11-10 (×2): 237 mL via ORAL
  Filled 2016-11-08 (×7): qty 237

## 2016-11-08 NOTE — Consult Note (Addendum)
[facility] Renal Consultation Note    Indication for Consultation:  Management of ESRD/hemodialysis; anemia, hypertension/volume and secondary hyperparathyroidism PCP: [name], MD  HPI: Oscar Castillo is a 65 y.o. male with ESRD on TTS dialysis with history of PVD, bilateral BKAs, afib on chronic coumadin, hx CVA, prior LTAC stay 2015, Hep C +, hx GIB who presented to dialysis yesterday with a temp of 99.8.  The charge RN called me and said he had no other symptoms except some redness over his access site, but it was nontender and without drainage. I was told that the staff cannulated away from the reddened area. Blood cultures were drawn and 1.75 gm of Vanc and 2 gm of Fortaz were given.  The patient came in at his dry weight of 85 but had which was a gain of 0.5 since he left 0l.5 below his previous treatment.  Post HD BP was 157/87 temp was 99.5.    A different RN documented that there were 2 small needle puncture sites noted upon arrival that were oozing a small amount of serosanguinous fluid throughout treatment.  At discharge the RN advised the patient's wife to bring him to the ED if the bleeding didn't stop or he became more ill.  According to the admission H and P the patient became more sluggish and became weak, diaphoretic- sweating and soaking his pajamas and  "taking out of his head".  He came to the ED about midnight and was admitted with sepsis.  His wife states the onset of malaise started about 3 days ago.  He had not been febrile at dialysis this past week until 4/14.  Today,  he tells me he was ok before dialysis yesterday.  He has not had a cough, or SOB, CP. He denies N, V, D.  He denies any new wound or particular pains. He does not make urine.    Review of dialysis records indicates that his last access intervention was 10/06/16 at [facility].   At that time he had a PTA and stent of a 70% intrastent stenosis in the basilic vein.  Dr. Augustin Coupe also noted that he had  "exposed struts that were trimmed without obvious signs of infection that that we needed to keep a close eye on this."    Past Medical History:  Diagnosis Date  . Anemia   . Antral ulcer 2014   small  . Atrial fibrillation with RVR ([place]) 10/07/2016  . BACK PAIN, LUMBAR, CHRONIC   . BENIGN PROSTATIC HYPERTROPHY   . Bipolar disorder ([place])    "sometimes" (10/07/2016)  . CHOLELITHIASIS   . Chronic combined systolic and diastolic CHF (congestive heart failure) ([place])   . Complication of anesthesia    wife states pt had trouble waking up with his last surgery in Nov., 2014  . CVA (cerebral vascular accident) ([place])   . DEPRESSION   . DIABETES MELLITUS, TYPE II    "not anymore" (10/07/2016)  . ERECTILE DYSFUNCTION   . ESRD (end stage renal disease) on dialysis [facility])    "[place]; TTS" (10/07/2016)  . ESRD on hemodialysis ([place])    ESRD due to DM/HTN. Started dialysis in November 2013.  HD TTS at [facility] on [address].  [name] GERD   . GI bleed    due to gastritis, discharged 10/02/16/notes 10/07/2016  . Headache    "monthly" (10/07/2016)  . Hemorrhoids   . Hepatitis C   . History of blood transfusion    "related  to dialysis" (10/07/2016)  . History of Clostridium difficile   . History of kidney stones   . HYPERTENSION   . LBBB (left bundle branch block)   . Morbid obesity ([place])   . PAF (paroxysmal atrial fibrillation) ([place])    a. Dx 12/2015.   Past Surgical History:  Procedure Laterality Date  . AMPUTATION Left 05/12/2013   Procedure: AMPUTATION RAY;  Surgeon: Newt Minion, MD;  Location: [place];  Service: Orthopedics;  Laterality: Left;  Left Foot 1st Ray Amputation  . AMPUTATION Left 06/09/2013   Procedure: AMPUTATION BELOW KNEE;  Surgeon: Newt Minion, MD;  Location: [place];  Service: Orthopedics;  Laterality: Left;  Left Below Knee Amputation and removal proximal screws IM tibial nail  . AMPUTATION Right 09/08/2013   Procedure: AMPUTATION BELOW KNEE;  Surgeon: Newt Minion, MD;   Location: [place];  Service: Orthopedics;  Laterality: Right;  Right Below Knee Amputation  . AMPUTATION Right 10/11/2013   Procedure: AMPUTATION BELOW KNEE;  Surgeon: Newt Minion, MD;  Location: [place];  Service: Orthopedics;  Laterality: Right;  Right Below Knee Amputation Revision  . AV FISTULA PLACEMENT  06/14/2012   Procedure: ARTERIOVENOUS (AV) FISTULA CREATION;  Surgeon: Angelia Mould, MD;  Location: [facility] OR;  Service: Vascular;  Laterality: Left;  Left basilic vein transposition with fistula.  . COLONOSCOPY N/A 10/28/2012   Procedure: COLONOSCOPY;  Surgeon: Jeryl Columbia, MD;  Location: [facility] ENDOSCOPY;  Service: Endoscopy;  Laterality: N/A;  . COLONOSCOPY N/A 11/02/2012   Procedure: COLONOSCOPY;  Surgeon: Cleotis Nipper, MD;  Location: [facility] ENDOSCOPY;  Service: Endoscopy;  Laterality: N/A;  . COLONOSCOPY N/A 11/03/2012   Procedure: COLONOSCOPY;  Surgeon: Cleotis Nipper, MD;  Location: [facility] ENDOSCOPY;  Service: Endoscopy;  Laterality: N/A;  . COLONOSCOPY N/A 09/16/2016   Procedure: COLONOSCOPY;  Surgeon: Jerene Bears, MD;  Location: [facility] ENDOSCOPY;  Service: Gastroenterology;  Laterality: N/A;  . ENTEROSCOPY N/A 11/08/2012   Procedure: ENTEROSCOPY;  Surgeon: Wonda Horner, MD;  Location: [facility] ENDOSCOPY;  Service: Endoscopy;  Laterality: N/A;  . ESOPHAGOGASTRODUODENOSCOPY N/A 11/02/2012   Procedure: ESOPHAGOGASTRODUODENOSCOPY (EGD);  Surgeon: Cleotis Nipper, MD;  Location: [facility] ENDOSCOPY;  Service: Endoscopy;  Laterality: N/A;  . ESOPHAGOGASTRODUODENOSCOPY (EGD) WITH PROPOFOL N/A 09/16/2016   Procedure: ESOPHAGOGASTRODUODENOSCOPY (EGD) WITH PROPOFOL;  Surgeon: Jerene Bears, MD;  Location: [facility] ENDOSCOPY;  Service: Gastroenterology;  Laterality: N/A;  . EYE SURGERY Left    to remove scar tissue  . FRACTURE SURGERY    . GIVENS CAPSULE STUDY N/A 11/04/2012   Procedure: GIVENS CAPSULE STUDY;  Surgeon: Cleotis Nipper, MD;  Location: [facility] ENDOSCOPY;  Service: Endoscopy;  Laterality: N/A;  . GIVENS CAPSULE  STUDY N/A 09/29/2016   Procedure: GIVENS CAPSULE STUDY;  Surgeon: Manus Gunning, MD;  Location: [place];  Service: Gastroenterology;  Laterality: N/A;  try to keep pt up in bedside chair as much as possible during the study.    [name] HARDWARE REMOVAL Left 06/09/2013   Procedure: HARDWARE REMOVAL;  Surgeon: Newt Minion, MD;  Location: [place];  Service: Orthopedics;  Laterality: Left;  Left Below Knee Amputation  and Removal proximal screws IM tibial nail  . IR GENERIC HISTORICAL  09/27/2016   IR US GUIDE VASC ACCESS RIGHT 09/27/2016 Aletta Edouard, MD [facility]-INTER[facility] RAD  . IR GENERIC HISTORICAL  09/27/2016   IR FLUORO GUIDE CV LINE RIGHT 09/27/2016 Aletta Edouard, MD [facility]INTER[facility]  . NEPHRECTOMY     partial RR  .  ORIF FIBULA FRACTURE Left 09/09/2012   Procedure: OPEN REDUCTION INTERNAL FIXATION (ORIF) FIBULA FRACTURE;  Surgeon: Johnny Bridge, MD;  Location: [place];  Service: Orthopedics;  Laterality: Left;  . TIBIA IM NAIL INSERTION Left 09/09/2012   Procedure: INTRAMEDULLARY (IM) NAIL TIBIAL;  Surgeon: Johnny Bridge, MD;  Location: [place];  Service: Orthopedics;  Laterality: Left;  left tibial nail and open reduction internal fixation left fibula fracture   Family History  Problem Relation Age of Onset  . Diabetes Mother   . Hypertension Mother   . Heart attack Father   . Hypertension Father   . Coronary artery disease Other    Social History:  reports that he has never smoked. He has never used smokeless tobacco. He reports that he does not drink alcohol or use drugs. No Known Allergies Prior to Admission medications   Medication Sig Start Date End Date Taking? Authorizing Provider  acetaminophen (TYLENOL) 500 MG tablet Take 500 mg by mouth every 6 (six) hours as needed for mild pain.   Yes Historical Provider, MD  amiodarone (PACERONE) 200 MG tablet TAKE 1 TABLET BY MOUTH DAILY, START THIS ON 11/06/16 11/02/16  Yes Mihai Croitoru, MD  atorvastatin (LIPITOR) 40 MG tablet TAKE 1 TABLET(40 MG)  BY MOUTH DAILY AT 6 PM 08/10/16  Yes [name], MD  calcium acetate (PHOSLO) 667 MG capsule Take 2,001 mg by mouth 3 (three) times daily with meals.   Yes Historical Provider, MD  latanoprost (XALATAN) 0.005 % ophthalmic solution Place 1 drop into both eyes at bedtime.  06/12/16  Yes Historical Provider, MD  lidocaine-prilocaine (EMLA) cream APPLY A SMALL AMOUNT TO SKIN AT THE ACCESS SITE (AVF) AS DIRECTED BEFORE EACH DIALYSIS SESSION THREE DAYS A WEEK 06/03/15  Yes Historical Provider, MD  pantoprazole (PROTONIX) 40 MG tablet Take 1 tablet (40 mg total) by mouth 2 (two) times daily before a meal. 10/14/16  Yes [name], MD  QUEtiapine (SEROQUEL) 25 MG tablet Take 1 tablet (25 mg total) by mouth 2 (two) times daily. 04/07/16  Yes [name], MD  SENSIPAR 30 MG tablet Take 30 mg by mouth every evening.  01/09/15  Yes Historical Provider, MD  silver sulfADIAZINE (SILVADENE) 1 % cream Apply 1 application topically daily as needed (irratation). 09/16/16  Yes Barton Dubois, MD  warfarin (COUMADIN) 2.5 MG tablet Take 2.5mg  nightly, check INR on 10/05/2016 Patient taking differently: Take 2.5-3.75 mg by mouth daily. Take 2.5 mg on Friday, Sunday and Tuesday.  all other days take 3.75 mg. 10/02/16  Yes Maryann Mikhail, DO   Current Facility-Administered Medications  Medication Dose Route Frequency Provider Last Rate Last Dose  . amiodarone (PACERONE) tablet 200 mg  200 mg Oral Daily Edwin Dada, MD   200 mg at 11/08/16 1009  . atorvastatin (LIPITOR) tablet 40 mg  40 mg Oral q1800 Edwin Dada, MD      . calcium acetate (PHOSLO) capsule 2,001 mg  2,001 mg Oral TID WC Edwin Dada, MD   2,001 mg at 11/08/16 1311  . cinacalcet (SENSIPAR) tablet 30 mg  30 mg Oral QPM Edwin Dada, MD      . insulin aspart (novoLOG) injection 0-5 Units  0-5 Units Subcutaneous QHS Edwin Dada, MD      . insulin aspart (novoLOG) injection 0-9 Units  0-9 Units Subcutaneous TID WC  Edwin Dada, MD   1 Units at 11/08/16 1312  . pantoprazole (PROTONIX) EC tablet 40  mg  40 mg Oral BID AC Edwin Dada, MD   40 mg at 11/08/16 1009  . piperacillin-tazobactam (ZOSYN) IVPB 3.375 g  3.375 g Intravenous Q12H Edwin Dada, MD   3.375 g at 11/08/16 1010  . sodium chloride flush (NS) 0.9 % injection 3 mL  3 mL Intravenous Q12H Edwin Dada, MD   3 mL at 11/08/16 1012  . [START ON 11/09/2016] vancomycin (VANCOCIN) IVPB 750 mg/150 ml premix  750 mg Intravenous Q M,W,F-HD Edwin Dada, MD      . warfarin (COUMADIN) tablet 2.5 mg  2.5 mg Oral Q T,Th,S,Su-1800 Erenest Blank, RPH      . [START ON 11/09/2016] warfarin (COUMADIN) tablet 3.75 mg  3.75 mg Oral Q M,W,F-1800 Erenest Blank, RPH      . Warfarin - Pharmacist Dosing Inpatient   Does not apply q1800 Erenest Blank, [facility]       Labs: Basic Metabolic Panel:  Recent Labs Lab 11/08/16 0000  NA 135  K 4.0  CL 93*  CO2 26  GLUCOSE 201*  BUN 32*  CREATININE 8.26*  CALCIUM 8.9   Liver Function Tests:  Recent Labs Lab 11/08/16 0000  AST 17  ALT 12*  ALKPHOS 44  BILITOT 0.8  PROT 7.4  ALBUMIN 3.3*   CBC:  Recent Labs Lab 11/08/16 0000  WBC 6.1  NEUTROABS 4.7  HGB 12.9*  HCT 40.0  MCV 93.0  PLT 147*   Cardiac Enzymes:  Recent Labs Lab 11/08/16 0753  TROPONINI 0.03*   CBG:  Recent Labs Lab 11/08/16 0742 11/08/16 1158  GLUCAP 117* 146*   Studies/Results: Dg Chest Port 1 View  Result Date: 11/08/2016 CLINICAL DATA:  Acute onset of generalized weakness. Initial encounter. EXAM: PORTABLE CHEST 1 VIEW COMPARISON:  Chest radiograph performed 07/01/2016 FINDINGS: The lungs are well-aerated and clear. There is no evidence of focal opacification, pleural effusion or pneumothorax. The cardiomediastinal silhouette is within normal limits. No acute osseous abnormalities are seen. The esophagus appears to be filled with air. IMPRESSION: 1. No acute cardiopulmonary  process seen. 2. Esophagus appears to be filled with air. Would correlate clinically for any evidence of esophageal dysmotility or recent swallowing of air. Electronically Signed   By: Garald Balding M.D.   On: 11/08/2016 01:04    ROS: As per HPI otherwise negative.  Physical Exam: Vitals:   11/08/16 0700 11/08/16 0757 11/08/16 0905 11/08/16 1300  BP: (!) 142/80  122/77 127/76  Pulse: 68 66 69 70  Resp: 15 14 17 15   Temp:  98.1 F (36.7 C)  98.3 F (36.8 C)  TempSrc:  Oral  Oral  SpO2: 100% 100% 100% 100%  Weight:      Height:         General: WDWN AAM NAD breathing easily  Head: NCAT sclera not icteric MMM Neck: Supple.  Lungs: CTA anteriorly  without wheezes, rales, or rhonchi. Heart: RRR with S1 S2.  Abdomen: soft NT + BS Lower extremities:  Bilateral BKA with prosthetic liners on Neuro: A & O  X 3. Moves all extremities spontaneously. Psych:  Responds to questions appropriately with a normal affect. Dialysis Access: left upper AVF with - open area 1-1.5 cm diameter with what looks to be several metalic areas? Struts?- min ooze - no overt purulence.  Dialysis Orders:  TTS [place] 4 hr EDW 83 2 K 2.5 Ca profile 4 heparin 2800 left upper AVF Hectorol 2 no ESA or Fe Recent labs:  hgb 14.2 4/12 38% sat Ca/P ok iPTH 362 Recent antibiotics: 1.75 gm Lucianne Lei 4/14 and 2 gm Tressie Ellis 4/14 prior to admission  Assessment/Plan: 1. Sepsis - likely secondary to infected AVF/stent - VVS to evaluate; Had Vanc 1.75 gm at dialysis Saturday and another 1 gm here plus Fortaz 2 gm at dialysis prior to Zosyn here - check random Vanc level with am labs  Discussed with Dr. Trula Slade, copy of last [facility] intervention (10/06/16) in shadow chart - PTA and stent of  70% mid to outflow basilic insent stensis.  Noted at that time to have "exposed struts trimmed without obvious signs of infection but need to keep a close eye on this" per procedure note.  2. ESRD -  TTS - not sure it is safe to dialyze with such a large open  area on access- follow labs 3. Hypertension/volume  - BP now near baseline; was hypotensive upon admission and received 1 L of fluid - CXR clear upon  admission - essentially kept even yesterday at dialysis - came in at EDW - left a little below edw Monday and Wed.- may need lowering slightly at d/c 4. Anemia  - hgb 12.9 - off ESA last dose was 75 on 4/3 - last tsat 39%- follow trend 5. Metabolic bone disease -  Continue hectorol/ binders/sensipar 6. DM - per primary 7. Nutrition -renal carb mod diet - vits/nepro 8. Hx afib/aflutter - NSR now - on amiodarone and chronic coumadin - INR 2.65 - need to hold for surgery - left drift down for now 9. Hx GIB  Myriam Jacobson, PA-C [facility] [phone] 11/08/2016, 1:24 PM   Renal Attending: Infected AVF with recently noted exposed stent will need surgical intervention due to active infection with staph bacteremia. VVS has already been consulted and if necessary we can reverse coagulopathy fro early surgical intervention. I agree with note as articulated above. Alexandrea Westergard C

## 2016-11-08 NOTE — H&P (Signed)
History and Physical  Patient Name: Oscar Castillo     DUK:[id number]    DOB: 10-31-1950    DOA: 11/07/2016 PCP: [name], MD   Patient coming from: Home  Chief Complaint: Fever, malaise, confusion  HPI: Oscar Castillo is a 66 y.o. male with a past medical history significant for ESRD on HD MWF, CHF EF 50%, Afib on amiodarone and warfarin, NIDDM and bilateral BKA who presents with fever, malaise.  Caveat that patient is sluggish and all history is collected from wife.  The patient was in his usual state of health until about 3 days ago when he had onset of vague malaise and increased activity. Yesterday at dialysis, he had a measured temperature and so was administered antibiotics during dialysis and recommended follow-up if the fever persisted. Then tonight, the patient's wife noticed that he was diaphoretic ("soaked his pajamas"), too weak to hold himself up, and "talking out of his head" a little bit, so she brought him to the ER. No cough, sputum production. He does not make urine. No skin sores.  ED course: -Afebrile, heart rate 74, respirations and pulse ox normal, blood pressure 75/56 (baseline in last two months of clinic notes is 376-283 systolic) -Na 151, K 4.0, Cr 8.26, WBC 6.1K, Hgb 12.9 -Lactic acid 2.14 -CXR wtihout pneumonia -INR 2.65 -Cultures were obtained and vancomycin/Zosyn were administered and TRH were asked to evaluate for sepsis         ROS: Review of Systems  Unable to perform ROS: Patient unresponsive  Constitutional: Positive for diaphoresis and fever.  Respiratory: Negative for cough, sputum production and shortness of breath.   Cardiovascular: Negative for chest pain.  Gastrointestinal: Negative for abdominal pain, diarrhea and vomiting.  Genitourinary:       Anuric   Skin: Negative for rash.  All other systems reviewed and are negative.   All review of systems collected from wife.      Past Medical History:  Diagnosis Date  . Anemia   .  Antral ulcer 2014   small  . Atrial fibrillation with RVR ([place]) 10/07/2016  . BACK PAIN, LUMBAR, CHRONIC   . BENIGN PROSTATIC HYPERTROPHY   . Bipolar disorder ([place])    "sometimes" (10/07/2016)  . CHOLELITHIASIS   . Chronic combined systolic and diastolic CHF (congestive heart failure) ([place])   . Complication of anesthesia    wife states pt had trouble waking up with his last surgery in Nov., 2014  . CVA (cerebral vascular accident) ([place])   . DEPRESSION   . DIABETES MELLITUS, TYPE II    "not anymore" (10/07/2016)  . ERECTILE DYSFUNCTION   . ESRD (end stage renal disease) on dialysis [facility])    "[place]; TTS" (10/07/2016)  . ESRD on hemodialysis ([place])    ESRD due to DM/HTN. Started dialysis in November 2013.  HD TTS at [facility] on [place].  [name] GERD   . GI bleed    due to gastritis, discharged 10/02/16/notes 10/07/2016  . Headache    "monthly" (10/07/2016)  . Hemorrhoids   . Hepatitis C   . History of blood transfusion    "related to dialysis" (10/07/2016)  . History of Clostridium difficile   . History of kidney stones   . HYPERTENSION   . LBBB (left bundle branch block)   . Morbid obesity ([place])   . PAF (paroxysmal atrial fibrillation) ([place])    a. Dx 12/2015.    Past Surgical History:  Procedure Laterality Date  .  AMPUTATION Left 05/12/2013   Procedure: AMPUTATION RAY;  Surgeon: Newt Minion, MD;  Location: [place];  Service: Orthopedics;  Laterality: Left;  Left Foot 1st Ray Amputation  . AMPUTATION Left 06/09/2013   Procedure: AMPUTATION BELOW KNEE;  Surgeon: Newt Minion, MD;  Location: [place];  Service: Orthopedics;  Laterality: Left;  Left Below Knee Amputation and removal proximal screws IM tibial nail  . AMPUTATION Right 09/08/2013   Procedure: AMPUTATION BELOW KNEE;  Surgeon: Newt Minion, MD;  Location: [place];  Service: Orthopedics;  Laterality: Right;  Right Below Knee Amputation  . AMPUTATION Right 10/11/2013   Procedure: AMPUTATION BELOW KNEE;  Surgeon: Newt Minion,  MD;  Location: [place];  Service: Orthopedics;  Laterality: Right;  Right Below Knee Amputation Revision  . AV FISTULA PLACEMENT  06/14/2012   Procedure: ARTERIOVENOUS (AV) FISTULA CREATION;  Surgeon: Angelia Mould, MD;  Location: [facility] OR;  Service: Vascular;  Laterality: Left;  Left basilic vein transposition with fistula.  . COLONOSCOPY N/A 10/28/2012   Procedure: COLONOSCOPY;  Surgeon: Jeryl Columbia, MD;  Location: [facility] ENDOSCOPY;  Service: Endoscopy;  Laterality: N/A;  . COLONOSCOPY N/A 11/02/2012   Procedure: COLONOSCOPY;  Surgeon: Cleotis Nipper, MD;  Location: [facility] ENDOSCOPY;  Service: Endoscopy;  Laterality: N/A;  . COLONOSCOPY N/A 11/03/2012   Procedure: COLONOSCOPY;  Surgeon: Cleotis Nipper, MD;  Location: [facility] ENDOSCOPY;  Service: Endoscopy;  Laterality: N/A;  . COLONOSCOPY N/A 09/16/2016   Procedure: COLONOSCOPY;  Surgeon: Jerene Bears, MD;  Location: [facility] ENDOSCOPY;  Service: Gastroenterology;  Laterality: N/A;  . ENTEROSCOPY N/A 11/08/2012   Procedure: ENTEROSCOPY;  Surgeon: Wonda Horner, MD;  Location: [facility] ENDOSCOPY;  Service: Endoscopy;  Laterality: N/A;  . ESOPHAGOGASTRODUODENOSCOPY N/A 11/02/2012   Procedure: ESOPHAGOGASTRODUODENOSCOPY (EGD);  Surgeon: Cleotis Nipper, MD;  Location: [facility] ENDOSCOPY;  Service: Endoscopy;  Laterality: N/A;  . ESOPHAGOGASTRODUODENOSCOPY (EGD) WITH PROPOFOL N/A 09/16/2016   Procedure: ESOPHAGOGASTRODUODENOSCOPY (EGD) WITH PROPOFOL;  Surgeon: Jerene Bears, MD;  Location: [facility] ENDOSCOPY;  Service: Gastroenterology;  Laterality: N/A;  . EYE SURGERY Left    to remove scar tissue  . FRACTURE SURGERY    . GIVENS CAPSULE STUDY N/A 11/04/2012   Procedure: GIVENS CAPSULE STUDY;  Surgeon: Cleotis Nipper, MD;  Location: [facility] ENDOSCOPY;  Service: Endoscopy;  Laterality: N/A;  . GIVENS CAPSULE STUDY N/A 09/29/2016   Procedure: GIVENS CAPSULE STUDY;  Surgeon: Manus Gunning, MD;  Location: [place];  Service: Gastroenterology;  Laterality: N/A;  try to keep pt up  in bedside chair as much as possible during the study.    [name] HARDWARE REMOVAL Left 06/09/2013   Procedure: HARDWARE REMOVAL;  Surgeon: Newt Minion, MD;  Location: [place];  Service: Orthopedics;  Laterality: Left;  Left Below Knee Amputation  and Removal proximal screws IM tibial nail  . IR GENERIC HISTORICAL  09/27/2016   IR US GUIDE VASC ACCESS RIGHT 09/27/2016 Aletta Edouard, MD [facility]-INTERV RAD  . IR GENERIC HISTORICAL  09/27/2016   IR FLUORO GUIDE CV LINE RIGHT 09/27/2016 Aletta Edouard, MD [facility]  . NEPHRECTOMY     partial RR  . ORIF FIBULA FRACTURE Left 09/09/2012   Procedure: OPEN REDUCTION INTERNAL FIXATION (ORIF) FIBULA FRACTURE;  Surgeon: Johnny Bridge, MD;  Location: [place];  Service: Orthopedics;  Laterality: Left;  . TIBIA IM NAIL INSERTION Left 09/09/2012   Procedure: INTRAMEDULLARY (IM) NAIL TIBIAL;  Surgeon: Johnny Bridge, MD;  Location: [place];  Service:  Orthopedics;  Laterality: Left;  left tibial nail and open reduction internal fixation left fibula fracture    Social History: Patient lives with his wife.  The patient is wheelchair bound (has prostheses, can't use them).  Non-smoker.  From [place], [place] orignially.  Is a retired Social research officer, government.    No Known Allergies  Family history: family history includes Coronary artery disease in his other; Diabetes in his mother; Heart attack in his father; Hypertension in his father and mother.  Prior to Admission medications   Medication Sig Start Date End Date Taking? Authorizing Provider  acetaminophen (TYLENOL) 500 MG tablet Take 500 mg by mouth every 6 (six) hours as needed for mild pain.   Yes Historical Provider, MD  amiodarone (PACERONE) 200 MG tablet TAKE 1 TABLET BY MOUTH DAILY, START THIS ON 11/06/16 11/02/16  Yes Mihai Croitoru, MD  atorvastatin (LIPITOR) 40 MG tablet TAKE 1 TABLET(40 MG) BY MOUTH DAILY AT 6 PM 08/10/16  Yes [name], MD  calcium acetate (PHOSLO) 667 MG capsule Take 2,001 mg by mouth 3 (three) times daily  with meals.   Yes Historical Provider, MD  latanoprost (XALATAN) 0.005 % ophthalmic solution Place 1 drop into both eyes at bedtime.  06/12/16  Yes Historical Provider, MD  lidocaine-prilocaine (EMLA) cream APPLY A SMALL AMOUNT TO SKIN AT THE ACCESS SITE (AVF) AS DIRECTED BEFORE EACH DIALYSIS SESSION THREE DAYS A WEEK 06/03/15  Yes Historical Provider, MD  pantoprazole (PROTONIX) 40 MG tablet Take 1 tablet (40 mg total) by mouth 2 (two) times daily before a meal. 10/14/16  Yes [name], MD  QUEtiapine (SEROQUEL) 25 MG tablet Take 1 tablet (25 mg total) by mouth 2 (two) times daily. 04/07/16  Yes [name], MD  SENSIPAR 30 MG tablet Take 30 mg by mouth every evening.  01/09/15  Yes Historical Provider, MD  silver sulfADIAZINE (SILVADENE) 1 % cream Apply 1 application topically daily as needed (irratation). 09/16/16  Yes Barton Dubois, MD  warfarin (COUMADIN) 2.5 MG tablet Take 2.5mg  nightly, check INR on 10/05/2016 Patient taking differently: Take 2.5-3.75 mg by mouth daily. Take 2.5 mg on Friday, Sunday and Tuesday.  all other days take 3.75 mg. 10/02/16  Yes Maryann Mikhail, DO       Physical Exam: BP 102/68   Pulse 69   Temp 98.7 F (37.1 C) (Oral)   Resp 17   Ht 5\' 11"  (1.803 m) Comment: with prosthesis in place  Wt 95.3 kg (210 lb)   SpO2 98%   BMI 29.29 kg/m  General appearance: Well-developed, elderly adult male, somnolent, sluggish, grimaces to stimuli, grips hand to command, does not open eyes or follow any other commands.   Eyes: Anicteric, conjunctiva pink, lids and lashes normal. PERRL.    ENT: No nasal deformity, discharge, epistaxis.  Hearing unable to assess. Lips normal, does not open mouth.   Neck: No neck masses.  Trachea midline.  No thyromegaly/tenderness. Lymph: No cervical or supraclavicular lymphadenopathy. Skin: Warm and dry.  No suspicious rashes or lesions. Cardiac: RRR, nl S1-S2, no murmurs appreciated.  Capillary refill is brisk.  No JVD.  No LE/stump edema.   Radial pulses 2+ and symmetric. Respiratory: Tachypnea, shallow breaths, ?rales on right. Abdomen: Abdomen soft.  No TTP. No ascites, distension, hepatosplenomegaly.   MSK: No deformities or effusions.  No cyanosis or clubbing.  Bilateral BKA. Neuro: PERRL.  Grasps hand weakly to command, otherwise does not follow ocmmands, only grimaces to touch.    Psych:  Unable to assess.     Labs on Admission:  I have personally reviewed following labs and imaging studies: CBC:  Recent Labs Lab 11/08/16 0000  WBC 6.1  NEUTROABS 4.7  HGB 12.9*  HCT 40.0  MCV 93.0  PLT 532*   Basic Metabolic Panel:  Recent Labs Lab 11/08/16 0000  NA 135  K 4.0  CL 93*  CO2 26  GLUCOSE 201*  BUN 32*  CREATININE 8.26*  CALCIUM 8.9   GFR: Estimated Creatinine Clearance: 10.5 mL/min (A) (by C-G formula based on SCr of 8.26 mg/dL (H)).  Liver Function Tests:  Recent Labs Lab 11/08/16 0000  AST 17  ALT 12*  ALKPHOS 44  BILITOT 0.8  PROT 7.4  ALBUMIN 3.3*   No results for input(s): LIPASE, AMYLASE in the last 168 hours. No results for input(s): AMMONIA in the last 168 hours. Coagulation Profile:  Recent Labs Lab 11/04/16 1536 11/08/16 0000  INR 3.9 2.65   Cardiac Enzymes: No results for input(s): CKTOTAL, CKMB, CKMBINDEX, TROPONINI in the last 168 hours. BNP (last 3 results) No results for input(s): PROBNP in the last 8760 hours. HbA1C: No results for input(s): HGBA1C in the last 72 hours. CBG: No results for input(s): GLUCAP in the last 168 hours. Lipid Profile: No results for input(s): CHOL, HDL, LDLCALC, TRIG, CHOLHDL, LDLDIRECT in the last 72 hours. Thyroid Function Tests: No results for input(s): TSH, T4TOTAL, FREET4, T3FREE, THYROIDAB in the last 72 hours. Anemia Panel: No results for input(s): VITAMINB12, FOLATE, FERRITIN, TIBC, IRON, RETICCTPCT in the last 72 hours. Sepsis Labs: Lactic acid 2.15 Invalid input(s): PROCALCITONIN, LACTICIDVEN No results found for this or  any previous visit (from the past 240 hour(s)).       Radiological Exams on Admission: Personally reviewed CXR without pneumonia: Dg Chest Port 1 View  Result Date: 11/08/2016 CLINICAL DATA:  Acute onset of generalized weakness. Initial encounter. EXAM: PORTABLE CHEST 1 VIEW COMPARISON:  Chest radiograph performed 07/01/2016 FINDINGS: The lungs are well-aerated and clear. There is no evidence of focal opacification, pleural effusion or pneumothorax. The cardiomediastinal silhouette is within normal limits. No acute osseous abnormalities are seen. The esophagus appears to be filled with air. IMPRESSION: 1. No acute cardiopulmonary process seen. 2. Esophagus appears to be filled with air. Would correlate clinically for any evidence of esophageal dysmotility or recent swallowing of air. Electronically Signed   By: Garald Balding M.D.   On: 11/08/2016 01:04    EKG: Independently reviewed. Rate 75, LBBB, no change from previous.  Echocardiogram June 2017: EF 50-55%       Assessment/Plan Principal Problem:   Sepsis ([place]) Active Problems:   Hypertensive heart disease with CHF (congestive heart failure) ([place])   Type 2 diabetes mellitus with chronic kidney disease on chronic dialysis, with long-term current use of insulin (HCC)   Chronic combined systolic and diastolic congestive heart failure   ESRD on hemodialysis ([facility])   S/P BKA (below knee amputation) bilateral (H[facility])   Paroxysmal atrial fibrillation (H[facility])   Anemia due to stage 5 chronic kidney disease ([place])  1. Sepsis, unclear source:  Patient meets criteria given tachypnea, reported fever, and evidence of organ dysfunction. Hypotension and elevated lactic acid.  Lactate 2.14 mmol/L and repeat ordered within 6 hours.  This patient is at high risk of poor outcomes with a qSOFA score of 3.  Antibiotics delivered in the ED.    -Sepsis bundle utilized:  -Blood and urine cultures drawn  -30 ml/kg bolus given in ED,  will repeat lactic  acid  -Antibiotics: vancomycin and Zosyn  -Repeat renal function and complete blood count in AM  -Code SEPSIS called to E-link  -Check troponin and echocardiogram given Hypotension/lethargy    2. Atrial fibrillation:  CHADS2Vasc 7.  On warfarin and amiodarone. -Continue warfarin -Continue amiodarone  3. CHF EF 50%:  -Check echocardiogram  4. ESRD:  -Continue Sensipar, Renvela -Consult to Nephrology, given fluid bolus  5. Diabetes:  -SSI with meals, low dose  6. Other medications:  -Continue statin -Continue PPI -Hold Seroquel for now       DVT prophylaxis: Heparin  Code Status: FULL  Family Communication: Wife at bedside  Disposition Plan: Anticipate IV fluids and empiric antibiotics and close monitoring of hemodynamics in stepdown.   Consults called: None Admission status: INPATIENT         Medical decision making: Patient seen at 2:10 AM on 11/08/2016.  The patient was discussed with Dr. Christy Gentles.  What exists of the patient's chart was reviewed in depth and summarized above.  Clinical condition: with BP improved after bolus, will monitor closely in stepdown.        Edwin Dada [facility] Pager [phone]    At the time of admission, it appears that the appropriate admission status for this patient is INPATIENT. This is judged to be reasonable and necessary in order to provide the required intensity of service to ensure the patient's safety given the presenting symptoms, physical exam findings, and initial radiographic and laboratory data in the context of their chronic comorbidities.  Together, these circumstances are felt to place him at high risk for further clinical deterioration threatening life, limb, or organ.   Patient requires inpatient status due to high intensity of service, high risk for further deterioration and high frequency of surveillance required because of this acute illness that poses a threat to life, limb or bodily  function.  I certify that at the point of admission it is my clinical judgment that the patient will require inpatient hospital care spanning beyond 2 midnights from the point of admission and that early discharge would result in unnecessary risk of decompensation and readmission or threat to life, limb or bodily function.

## 2016-11-08 NOTE — Progress Notes (Signed)
11/07/2016 11:49 PM  11/08/2016 7:53 AM  Oscar Castillo was seen and examined.  The H&P by the admitting provider, orders, imaging was reviewed.  Please see orders.  Will continue to follow.   Murvin Natal, MD [facility]

## 2016-11-08 NOTE — ED Notes (Signed)
Pt is anuric. 

## 2016-11-08 NOTE — Progress Notes (Signed)
      INFECTIOUS DISEASE ATTENDING ADDENDUM:   Date: 11/08/2016  Patient name: Oscar Castillo  Medical record number: [id number]  Date of birth: October 10, 1950        [facility] Antimicrobial Management Team Staphylococcus aureus bacteremia   Staphylococcus aureus bacteremia (SAB) is associated with a high rate of complications and mortality.  Specific aspects of clinical management are critical to optimizing the outcome of patients with SAB.  Therefore, the [facility] Antimicrobial Management Team [facility]) has initiated an intervention aimed at improving the management of SAB at [facility].  To do so, Infectious Diseases physicians are providing an evidence-based consult for the management of all patients with SAB.     Yes No Comments  Perform follow-up blood cultures (even if the patient is afebrile) to ensure clearance of bacteremia [x]  []  ORDERED TODAY  Remove vascular catheter and obtain follow-up blood cultures after the removal of the catheter []  []    Perform echocardiography to evaluate for endocarditis (transthoracic ECHO is 40-50% sensitive, TEE is > 90% sensitive) [x]  []  Please keep in mind, that neither test can definitively EXCLUDE endocarditis, and that should clinical suspicion remain high for endocarditis the patient should then still be treated with an "endocarditis" duration of therapy = 6 weeks'"  TTE  Consult electrophysiologist to evaluate implanted cardiac device (pacemaker, ICD) []  []    Ensure source control []  []  Have all abscesses been drained effectively? Have deep seeded infections (septic joints or osteomyelitis) had appropriate surgical debridement?   NOT CLEAR  Investigate for "metastatic" sites of infection []  []  Does the patient have ANY symptom or physical exam finding that would suggest a deeper infection (back or neck pain that may be suggestive of vertebral osteomyelitis or epidural abscess, muscle pain that could be a symptom of pyomyositis)?  Keep in  mind that for deep seeded infections MRI imaging with contrast is preferred rather than other often insensitive tests such as plain x-rays, especially early in a patient's presentation.  FORMAL ID CONSULT TOMORROW  Change antibiotic therapy to CEFAZOLIN []  []  Beta-lactam antibiotics are preferred for MSSA due to higher cure rates.   If on Vancomycin, goal trough should be 15 - 20 mcg/mL  Estimated duration of IV antibiotic therapy:  Depending upon TEE , deep infection investigation, 4-6 weeks []  []  Consult case management for probably prolonged outpatient IV antibiotic therapy   Dr. Johnnye Sima to see formally tomorrow.     Rhina Br[place] 11/08/2016, 6:01 PM

## 2016-11-08 NOTE — ED Provider Notes (Signed)
[facility] DEPT Provider Note   CSN: [id number] Arrival date & time: 11/07/16  2317   By signing my name below, I, Soijett Blue, attest that this documentation has been prepared under the direction and in the presence of Ripley Fraise, MD. Electronically Signed: Soijett Blue, ED Scribe. 11/08/16. 12:11 AM.  History   Chief Complaint Chief Complaint  Patient presents with  . Fatigue    HPI Oscar Castillo is a 66 y.o. male with a PMHx of HTN, DM who presents to the Emergency Department complaining of increased fatigue onset 2 days ago. Pt reports associated fever, generalized weakness, and chronic decreased urine output. Pt has not tried any medications for the relief of his symptoms. Wife states that the pt last dialysis session was yesterday morning with a fever noted and was given antibiotics. He denies vomiting, diarrhea, CP, SOB, cough, HA, abdominal pain, leg pain, and any other symptoms. Wife denies the pt having allergies to antibiotics.  He does not currently produce urine   The history is provided by the patient and the spouse. No language interpreter was used.    Past Medical History:  Diagnosis Date  . Anemia   . Antral ulcer 2014   small  . Atrial fibrillation with RVR ([place]) 10/07/2016  . BACK PAIN, LUMBAR, CHRONIC   . BENIGN PROSTATIC HYPERTROPHY   . Bipolar disorder ([place])    "sometimes" (10/07/2016)  . CHOLELITHIASIS   . Chronic combined systolic and diastolic CHF (congestive heart failure) ([place])   . Complication of anesthesia    wife states pt had trouble waking up with his last surgery in Nov., 2014  . CVA (cerebral vascular accident) ([place])   . DEPRESSION   . DIABETES MELLITUS, TYPE II    "not anymore" (10/07/2016)  . ERECTILE DYSFUNCTION   . ESRD (end stage renal disease) on dialysis [facility])    "[place]; TTS" (10/07/2016)  . ESRD on hemodialysis ([place])    ESRD due to DM/HTN. Started dialysis in November 2013.  HD TTS at [facility] [place].  [name] GERD     . GI bleed    due to gastritis, discharged 10/02/16/notes 10/07/2016  . Headache    "monthly" (10/07/2016)  . Hemorrhoids   . Hepatitis C   . History of blood transfusion    "related to dialysis" (10/07/2016)  . History of Clostridium difficile   . History of kidney stones   . HYPERTENSION   . LBBB (left bundle branch block)   . Morbid obesity ([place])   . PAF (paroxysmal atrial fibrillation) ([place])    a. Dx 12/2015.    Patient Active Problem List   Diagnosis Date Noted  . Encounter for therapeutic drug monitoring 10/07/2016  . Diabetes mellitus with complication ([place])   . Anemia due to stage 5 chronic kidney disease ([place])   . Melena 09/26/2016  . Pressure injury of skin 09/26/2016  . Benign neoplasm of descending colon   . Gastroesophageal reflux disease with esophagitis   . Duodenal ulcer without hemorrhage or perforation   . Blood loss anemia 09/15/2016  . Paroxysmal atrial fibrillation ([place]) 09/15/2016  . Heme positive stool 09/15/2016  . RUQ pain 07/12/2016  . Hyperkalemia 07/01/2016  . Anemia 12/31/2015  . Atrial fibrillation with RVR ([place]) 12/27/2015  . Glaucoma 12/27/2015  . LBBB (left bundle branch block)   . Rash 11/15/2015  . Cramping of hands 11/15/2015  . Liver fibrosis 10/07/2015  . Diarrhea 08/14/2014  . Dehydration 10/02/2013  .  Fever 10/02/2013  . Altered mental status 10/01/2013  . Encephalopathy, toxic 10/01/2013  . Encephalopathy, metabolic 26/71/2458  . History of stroke 09/28/2013  . FTT (failure to thrive) in adult 09/28/2013  . Acute confusional state 09/28/2013  . Ulcer of sacral region, stage 3 ([place]) 09/26/2013  . S/P BKA (below knee amputation) bilateral ([place]) 09/08/2013  . ESRD on hemodialysis ([place]) 05/09/2013  . TIA (transient ischemic attack) 02/20/2013  . Acute blood loss anemia 10/28/2012  . Chronic combined systolic and diastolic congestive heart failure 10/28/2012  . Constipation 10/28/2012  . GI bleed 10/27/2012  . Mass in rectum  10/27/2012  . DM (diabetes mellitus) type I controlled with renal manifestation ([place]) 09/08/2012  . LVH (left ventricular hypertrophy)-severe concentric 06/13/2012  . Anemia due to chronic illness 06/12/2012  . Hyperlipidemia 01/30/2011  . CHOLELITHIASIS 08/01/2010  . BENIGN PROSTATIC HYPERTROPHY 08/01/2010  . CEREBROVASCULAR ACCIDENT, HX OF 08/06/2009  . Depression 03/18/2009  . NEPHROLITHIASIS, HX OF 03/18/2009  . Morbid obesity ([place]) 03/25/2007  . Hypertensive heart disease with CHF (congestive heart failure) ([place]) 03/25/2007  . GERD 03/25/2007  . Chronic hepatitis C without hepatic coma ([place]) 03/25/2007    Past Surgical History:  Procedure Laterality Date  . AMPUTATION Left 05/12/2013   Procedure: AMPUTATION RAY;  Surgeon: Newt Minion, MD;  Location: [facility];  Service: Orthopedics;  Laterality: Left;  Left Foot 1st Ray Amputation  . AMPUTATION Left 06/09/2013   Procedure: AMPUTATION BELOW KNEE;  Surgeon: Newt Minion, MD;  Location: [facility];  Service: Orthopedics;  Laterality: Left;  Left Below Knee Amputation and removal proximal screws IM tibial nail  . AMPUTATION Right 09/08/2013   Procedure: AMPUTATION BELOW KNEE;  Surgeon: Newt Minion, MD;  Location: [facility];  Service: Orthopedics;  Laterality: Right;  Right Below Knee Amputation  . AMPUTATION Right 10/11/2013   Procedure: AMPUTATION BELOW KNEE;  Surgeon: Newt Minion, MD;  Location: [facility];  Service: Orthopedics;  Laterality: Right;  Right Below Knee Amputation Revision  . AV FISTULA PLACEMENT  06/14/2012   Procedure: ARTERIOVENOUS (AV) FISTULA CREATION;  Surgeon: Angelia Mould, MD;  Location[facility] OR;  Service: Vascular;  Laterality: Left;  Left basilic vein transposition with fistula.  . COLONOSCOPY N/A 10/28/2012   Procedure: COLONOSCOPY;  Surgeon: Jeryl Columbia, MD;  Location: [facility] ENDOSCOPY;  Service: Endoscopy;  Laterality: N/A;  . COLONOSCOPY N/A 11/02/2012   Procedure: COLONOSCOPY;  Surgeon: Cleotis Nipper, MD;   Location[facility] ENDOSCOPY;  Service: Endoscopy;  Laterality: N/A;  . COLONOSCOPY N/A 11/03/2012   Procedure: COLONOSCOPY;  Surgeon: Cleotis Nipper, MD;  Location: [facility] ENDOSCOPY;  Service: Endoscopy;  Laterality: N/A;  . COLONOSCOPY N/A 09/16/2016   Procedure: COLONOSCOPY;  Surgeon: Jerene Bears, MD;  Location: [facility] ENDOSCOPY;  Service: Gastroenterology;  Laterality: N/A;  . ENTEROSCOPY N/A 11/08/2012   Procedure: ENTEROSCOPY;  Surgeon: Wonda Horner, MD;  Location: [facility] ENDOSCOPY;  Service: Endoscopy;  Laterality: N/A;  . ESOPHAGOGASTRODUODENOSCOPY N/A 11/02/2012   Procedure: ESOPHAGOGASTRODUODENOSCOPY (EGD);  Surgeon: Cleotis Nipper, MD;  Location[facility]COPY;  Service: Endoscopy;  Laterality: N/A;  . ESOPHAGOGASTRODUODENOSCOPY (EGD) WITH PROPOFOL N/A 09/16/2016   Procedure: ESOPHAGOGASTRODUODENOSCOPY (EGD) WITH PROPOFOL;  Surgeon: Jerene Bears, MD;  Location: [facility] ENDOSCOPY;  Service: Gastroenterology;  Laterality: N/A;  . EYE SURGERY Left    to remove scar tissue  . FRACTURE SURGERY    . GIVENS CAPSULE STUDY N/A 11/04/2012   Procedure: GIVENS CAPSULE STUDY;  Surgeon: Youlanda Mighty  Buccini, MD;  Location: [place] [facility] ENDOSCOPY;  Service: Endoscopy;  Laterality: N/A;  . GIVENS CAPSULE STUDY N/A 09/29/2016   Procedure: GIVENS CAPSULE STUDY;  Surgeon: Manus Gunning, MD;  Location: [facility];  Service: Gastroenterology;  Laterality: N/A;  try to keep pt up in bedside chair as much as possible during the study.    [name] HARDWARE REMOVAL Left 06/09/2013   Procedure: HARDWARE REMOVAL;  Surgeon: Newt Minion, MD;  Location: [facility];  Service: Orthopedics;  Laterality: Left;  Left Below Knee Amputation  and Removal proximal screws IM tibial nail  . IR GENERIC HISTORICAL  09/27/2016   IR US GUIDE VASC ACCESS RIGHT 09/27/2016 Aletta Edouard, MD [facility]-INTERV RAD  . IR GENERIC HISTORICAL  09/27/2016   IR FLUORO GUIDE CV LINE RIGHT 09/27/2016 Aletta Edouard, MD [facility]  . NEPHRECTOMY     partial RR  . ORIF FIBULA FRACTURE Left  09/09/2012   Procedure: OPEN REDUCTION INTERNAL FIXATION (ORIF) FIBULA FRACTURE;  Surgeon: Johnny Bridge, MD;  Location: [facility];  Service: Orthopedics;  Laterality: Left;  . TIBIA IM NAIL INSERTION Left 09/09/2012   Procedure: INTRAMEDULLARY (IM) NAIL TIBIAL;  Surgeon: Johnny Bridge, MD;  Location: [facility];  Service: Orthopedics;  Laterality: Left;  left tibial nail and open reduction internal fixation left fibula fracture       Home Medications    Prior to Admission medications   Medication Sig Start Date End Date Taking? Authorizing Provider  acetaminophen (TYLENOL) 500 MG tablet Take 500 mg by mouth every 6 (six) hours as needed for mild pain.    Historical Provider, MD  amiodarone (PACERONE) 200 MG tablet TAKE 1 TABLET BY MOUTH DAILY, START THIS ON 11/06/16 11/02/16   Mihai Croitoru, MD  atorvastatin (LIPITOR) 40 MG tablet TAKE 1 TABLET(40 MG) BY MOUTH DAILY AT 6 PM 08/10/16   [name], MD  calcium acetate (PHOSLO) 667 MG capsule Take 2,001 mg by mouth 3 (three) times daily with meals.    Historical Provider, MD  latanoprost (XALATAN) 0.005 % ophthalmic solution Place 1 drop into both eyes at bedtime.  06/12/16   Historical Provider, MD  lidocaine-prilocaine (EMLA) cream APPLY A SMALL AMOUNT TO SKIN AT THE ACCESS SITE (AVF) AS DIRECTED BEFORE EACH DIALYSIS SESSION THREE DAYS A WEEK 06/03/15   Historical Provider, MD  pantoprazole (PROTONIX) 40 MG tablet Take 1 tablet (40 mg total) by mouth 2 (two) times daily before a meal. 10/14/16   [name], MD  QUEtiapine (SEROQUEL) 25 MG tablet Take 1 tablet (25 mg total) by mouth 2 (two) times daily. 04/07/16   [name], MD  SENSIPAR 30 MG tablet Take 30 mg by mouth every evening.  01/09/15   Historical Provider, MD  silver sulfADIAZINE (SILVADENE) 1 % cream Apply 1 application topically daily as needed (irratation). 09/16/16   Barton Dubois, MD  warfarin (COUMADIN) 2.5 MG tablet Take 2.5mg  nightly, check INR on 10/05/2016 Patient taking  differently: Take 2.5-5 mg by mouth See admin instructions. Take 5 mg by mouth on Tuesday and Wednesday only. Then take 3.75 mg by mouth daily on all days of the week except 2.5 mg by mouth on Sunday. 10/02/16   Cristal Ford, DO    Family History Family History  Problem Relation Age of Onset  . Diabetes Mother   . Hypertension Mother   . Heart attack Father   . Hypertension Father   . Coronary artery disease Other     Social  History Social History  Substance Use Topics  . Smoking status: Never Smoker  . Smokeless tobacco: Never Used  . Alcohol use No     Allergies   Patient has no known allergies.   Review of Systems Review of Systems  Constitutional: Positive for fatigue and fever.  Respiratory: Negative for shortness of breath.   Cardiovascular: Negative for chest pain.  Gastrointestinal: Negative for abdominal pain, diarrhea and vomiting.  Genitourinary: Positive for decreased urine volume.  Neurological: Positive for weakness (generalized). Negative for headaches.  All other systems reviewed and are negative.    Physical Exam Updated Vital Signs BP (!) 75/56 (BP Location: Right Arm)   Pulse 74   Temp 98.7 F (37.1 C) (Oral)   Resp 18   SpO2 99%   Physical Exam CONSTITUTIONAL: chronically ill appearing.  HEAD: Normocephalic/atraumatic ENMT: Mucous membranes moist NECK: supple no meningeal signs SPINE/BACK:entire spine nontender CV: S1/S2 noted, no murmurs/rubs/gallops noted LUNGS: decreased breath sounds bilaterally. No distress noted.   ABDOMEN: soft, nontender, no rebound or guarding, bowel sounds noted throughout abdomen GU:no cva tenderness NEURO: Pt is awake/alert/appropriate, moves all extremitiesx4.  No facial droop.   EXTREMITIES: bilateral BKA noted. Dialysis access noted to left arm.  SKIN: warm, color normal PSYCH: no abnormalities of mood noted, alert and oriented to situation   ED Treatments / Results  DIAGNOSTIC STUDIES: Oxygen  Saturation is 99% on RA, nl by my interpretation.    COORDINATION OF CARE: 12:09 AM Discussed treatment plan with pt at bedside which includes labs, UA, CXR, and pt agreed to plan.   Labs (all labs ordered are listed, but only abnormal results are displayed) Labs Reviewed  COMPREHENSIVE METABOLIC PANEL - Abnormal; Notable for the following:       Result Value   Chloride 93 (*)    Glucose, Bld 201 (*)    BUN 32 (*)    Creatinine, Ser 8.26 (*)    Albumin 3.3 (*)    ALT 12 (*)    GFR calc non Af Amer 6 (*)    GFR calc Af Amer 7 (*)    Anion gap 16 (*)    All other components within normal limits  CBC WITH DIFFERENTIAL/PLATELET - Abnormal; Notable for the following:    Hemoglobin 12.9 (*)    RDW 18.9 (*)    Platelets 147 (*)    All other components within normal limits  PROTIME-INR - Abnormal; Notable for the following:    Prothrombin Time 28.8 (*)    All other components within normal limits  I-STAT CG4 LACTIC ACID, ED - Abnormal; Notable for the following:    Lactic Acid, Venous 2.14 (*)    All other components within normal limits  CULTURE, BLOOD (ROUTINE X 2)  CULTURE, BLOOD (ROUTINE X 2)  URINALYSIS, ROUTINE W REFLEX MICROSCOPIC  I-STAT CG4 LACTIC ACID, ED    EKG  EKG Interpretation  Date/Time:  Sunday November 08 2016 00:01:06 EDT Ventricular Rate:  75 PR Interval:    QRS Duration: 188 QT Interval:  525 QTC Calculation: 587 R Axis:   -24 Text Interpretation:  Sinus rhythm Prolonged PR interval Left bundle branch block No significant change since last tracing Confirmed by Christy Gentles  MD, [place] ([id number]) on 11/08/2016 12:17:09 AM       Radiology Dg Chest Port 1 View  Result Date: 11/08/2016 CLINICAL DATA:  Acute onset of generalized weakness. Initial encounter. EXAM: PORTABLE CHEST 1 VIEW COMPARISON:  Chest radiograph performed 07/01/2016 FINDINGS: The lungs  are well-aerated and clear. There is no evidence of focal opacification, pleural effusion or pneumothorax. The  cardiomediastinal silhouette is within normal limits. No acute osseous abnormalities are seen. The esophagus appears to be filled with air. IMPRESSION: 1. No acute cardiopulmonary process seen. 2. Esophagus appears to be filled with air. Would correlate clinically for any evidence of esophageal dysmotility or recent swallowing of air. Electronically Signed   By: Garald Balding M.D.   On: 11/08/2016 01:04    Procedures Procedures  CRITICAL CARE Performed by: Sharyon Cable Total critical care time: 40 minutes Critical care time was exclusive of separately billable procedures and treating other patients. Critical care was necessary to treat or prevent imminent or life-threatening deterioration. Critical care was time spent personally by me on the following activities: development of treatment plan with patient and/or surrogate as well as nursing, discussions with consultants, evaluation of patient's response to treatment, examination of patient, obtaining history from patient or surrogate, ordering and performing treatments and interventions, ordering and review of laboratory studies, ordering and review of radiographic studies, pulse oximetry and re-evaluation of patient's condition. PATIENT WITH HYPOTENSION THAT REQUIRED IV FLUID RESUSCITATION.  BLOOD PRESSURE 75 SYSTOLIC  Medications Ordered in ED Medications  sodium chloride 0.9 % bolus 1,000 mL (0 mLs Intravenous Stopped 11/08/16 0045)    And  sodium chloride 0.9 % bolus 1,000 mL (0 mLs Intravenous Stopped 11/08/16 0138)    And  sodium chloride 0.9 % bolus 1,000 mL (1,000 mLs Intravenous Not Given 11/08/16 0143)  piperacillin-tazobactam (ZOSYN) IVPB 3.375 g (0 g Intravenous Stopped 11/08/16 0109)  vancomycin (VANCOCIN) IVPB 1000 mg/200 mL premix (0 mg Intravenous Stopped 11/08/16 0142)     Initial Impression / Assessment and Plan / ED Course  I have reviewed the triage vital signs and the nursing notes.  Pertinent labs & imaging results  that were available during my care of the patient were reviewed by me and considered in my medical decision making (see chart for details).     2:27 AM PT WITH H/O MULTIPLE MEDICAL CONDITIONS HE IS A DIALYSIS PATIENT HE HAD FEVER AT DIALYSIS AND GIVEN IV ANTIBIOTICS SINCE THEN HE HAS HAD INCREASED FATIGUE HE DENIES HEADACHE OR ANY OTHER PAIN COMPLAINTS NO SIGNS OF MENINGITIS HIS BLOOD PRESSURE HAS IMPROVED HERE WITH IV FLUIDS HE IS RESTING COMFORTABLY  D/w dr danford for admission  Final Clinical Impressions(s) / ED Diagnoses   Final diagnoses:  Septic shock ([place])    New Prescriptions New Prescriptions   No medications on file   I personally performed the services described in this documentation, which was scribed in my presence. The recorded information has been reviewed and is accurate.        Ripley Fraise, MD 11/08/16 [phone]

## 2016-11-08 NOTE — Consult Note (Addendum)
[facility] [place]  Patient name: ISSAAC Castillo MRN: [id number] DOB: 11-10-1950 Sex: male   REFERRING PROVIDER:    Renal   REASON FOR CONSULT:    Infected left arm fistula   HISTORY OF PRESENT ILLNESS:   Oscar Castillo is a 66 y.o. male, who has a history of ESRD on HD TTS who had a left arm BVT placed in 2013 by Dr. Scot Dock.  He came to dialysis yesterday with a temp of 99.8.  He did have some redness around an area of his fistula.  He did complete dialysis.  Blood cultures were drawn, and he was given abx.  He came to the ER today sluggish, weak, diaphoretic and "talking out of his head"  He was admitted for sepsis.  He has not had any significant bleeding from his fistula  His most recent intervention, by Dr. Augustin Coupe was on 10-06-2016.  He had in-stent stenosis which did not respond to PTA and so another stent was placed.  He was noted to have exposed struts which were trimmed.  There was no sign of infection at that time  He is on coumadin for AFIB.  He is a diabetic.  He has CHF with ah EF of 50%.  He takes a statin for hypercholesterolemia  PAST MEDICAL HISTORY    Past Medical History:  Diagnosis Date  . Anemia   . Antral ulcer 2014   small  . Atrial fibrillation with RVR ([place]) 10/07/2016  . BACK PAIN, LUMBAR, CHRONIC   . BENIGN PROSTATIC HYPERTROPHY   . Bipolar disorder ([place])    "sometimes" (10/07/2016)  . CHOLELITHIASIS   . Chronic combined systolic and diastolic CHF (congestive heart failure) ([place])   . Complication of anesthesia    wife states pt had trouble waking up with his last surgery in Nov., 2014  . CVA (cerebral vascular accident) ([place])   . DEPRESSION   . DIABETES MELLITUS, TYPE II    "not anymore" (10/07/2016)  . ERECTILE DYSFUNCTION   . ESRD (end stage renal disease) on dialysis [facility])    "[place]; TTS" (10/07/2016)  . ESRD on hemodialysis ([place])    ESRD due to DM/HTN. Started dialysis in November 2013.   HD TTS at [facility] on [address].  [name] GERD   . GI bleed    due to gastritis, discharged 10/02/16/notes 10/07/2016  . Headache    "monthly" (10/07/2016)  . Hemorrhoids   . Hepatitis C   . History of blood transfusion    "related to dialysis" (10/07/2016)  . History of Clostridium difficile   . History of kidney stones   . HYPERTENSION   . LBBB (left bundle branch block)   . Morbid obesity ([place])   . PAF (paroxysmal atrial fibrillation) ([place])    a. Dx 12/2015.     FAMILY HISTORY   Family History  Problem Relation Age of Onset  . Diabetes Mother   . Hypertension Mother   . Heart attack Father   . Hypertension Father   . Coronary artery disease Other     SOCIAL HISTORY:   Social History   Social History  . Marital status: Married    Spouse name: N/A  . Number of children: 2  . Years of education: 16   Occupational History  . disabled due to stroke Retired   Social History Main Topics  . Smoking status: Never Smoker  . Smokeless tobacco: Never Used  . Alcohol use No  .  Drug use: No  . Sexual activity: Yes    Birth control/ protection: None   Other Topics Concern  . 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 0.9 %  sodium chloride infusion   Intravenous Once Serafina Mitchell, MD      . amiodarone (PACERONE) tablet 200 mg  200 mg Oral Daily Edwin Dada, MD   200 mg at 11/08/16 1009  . atorvastatin (LIPITOR) tablet 40 mg  40 mg Oral q1800 Edwin Dada, MD   40 mg at 11/08/16 1708  . calcium acetate (PHOSLO) capsule 2,001 mg  2,001 mg Oral TID WC Edwin Dada, MD   2,001 mg at 11/08/16 1708  . ceFAZolin (ANCEF) IVPB 2g/100 mL premix  2 g Intravenous Once Truman Hayward, MD      .  Derrill Memo ON 11/10/2016] ceFAZolin (ANCEF) IVPB 2g/100 mL premix  2 g Intravenous Q T,Th,Sa-HD Truman Hayward, MD      . cinacalcet [facility]) tablet 30 mg  30 mg Oral QPM Edwin Dada, MD   30 mg at 11/08/16 1708  . [START ON 11/10/2016] doxercalciferol (HECTOROL) injection 2 mcg  2 mcg Intravenous Q T,Th,Sa-HD Alric Seton, PA-C      . feeding supplement (NEPRO CARB STEADY) liquid 237 mL  237 mL Oral BID BM Alric Seton, PA-C   237 mL at 11/08/16 1524  . insulin aspart (novoLOG) injection 0-5 Units  0-5 Units Subcutaneous QHS Edwin Dada, MD      . insulin aspart (novoLOG) injection 0-9 Units  0-9 Units Subcutaneous TID WC Edwin Dada, MD   1 Units at 11/08/16 1312  . multivitamin (RENA-VIT) tablet 1 tablet  1 tablet Oral QHS Alric Seton, PA-C      . pantoprazole (PROTONIX) EC tablet 40 mg  40 mg Oral BID AC Edwin Dada, MD   40 mg at 11/08/16 1708  . sodium chloride flush (NS) 0.9 % injection 3 mL  3 mL Intravenous Q12H Edwin Dada, MD   3 mL at 11/08/16 1012  . [START ON 11/09/2016] vancomycin (VANCOCIN) IVPB 1000 mg/200 mL premix  1,000 mg Intravenous To OR Serafina Mitchell, MD        REVIEW OF SYSTEMS:   [X]  denotes positive finding, [ ]  denotes negative finding Cardiac  Comments:  Chest pain or chest pressure:    Shortness of breath upon exertion:    Short of breath when lying flat:    Irregular heart rhythm:        Vascular    Pain in calf, thigh, or hip brought on by ambulation:    Pain in feet at night that wakes you up from your sleep:     Blood clot in your veins:    Leg swelling:         Pulmonary    Oxygen at home:    Productive cough:     Wheezing:         Neurologic    Sudden weakness in arms or legs:     Sudden numbness in arms or legs:     Sudden onset of difficulty speaking or slurred speech:    Temporary  loss of vision in one eye:     Problems with dizziness:         Gastrointestinal    Blood in stool:       Vomited blood:         Genitourinary    Burning when urinating:     Blood in urine:        Psychiatric    Major depression:         Hematologic    Bleeding problems:    Problems with blood clotting too easily:        Skin    Rashes or ulcers: x       Constitutional    Fever or chills: x    PHYSICAL EXAM:   Vitals:   11/08/16 0757 11/08/16 0905 11/08/16 1300 11/08/16 1645  BP:  122/77 127/76 126/82  Pulse: 66 69 70 73  Resp: 14 17 15 17   Temp: 98.1 F (36.7 C)  98.3 F (36.8 C) 98.5 F (36.9 C)  TempSrc: Oral  Oral Oral  SpO2: 100% 100% 100% 100%  Weight:      Height:        GENERAL: The patient is a well-nourished male, in no acute distress. The vital signs are documented above. CARDIAC: There is a regular rate and rhythm.  VASCULAR: palpable left BVT PULMONARY: Nonlabored respirations MUSCULOSKELETAL: There are no major deformities or cyanosis. NEUROLOGIC: No focal weakness or paresthesias are detected. SKIN: 1.5 cm defect over mid portion of left BVT with stent struts visulaized. PSYCHIATRIC: The patient has a normal affect.  STUDIES:   XRAY of left arm to determine full extent of stented area of BVT  ASSESSMENT and PLAN   Ulcer of left BVT with stent struts exposed.  Given erythema at HD over this area, this is likely the source of his sepsis.  With stent struts visualized, I do not think that this fistula can be salvaged.  I discussed this with the patient and family.  I have recommended removing the BVT.  He understands that this may require an incision throughout his upper arm.  The stented area will likely need to be removed in its entirity, given that they are likely infected.  I will get an xray for planning purposes so that we will know the extent of the stented vein.  He will require a HD catheter placement at the time of fistula removal.  I am giving him 2 units of FFP tonight to reverse his coumadin.  Will repeat coags in the am to see if he  needs additional FFP prior to surgery which has been scheduled for tomorrow.   Annamarie Major, MD [facility] [phone] Pager [phone]

## 2016-11-08 NOTE — ED Notes (Signed)
Admitting at the bedside.  

## 2016-11-08 NOTE — Progress Notes (Signed)
Pharmacy note: SCIP Core measures  Patient for procedure on 4/16 and ordered vancomycin.  He is noted on Ancef 2gm with HD and he also received vancomycin at HD on 4/14 and another dose on 4/15 (no further HD since this time). He should not need any antibiotic dosing around surgery -Spoke with Dr. Trula Slade: okay to discontinue pre-op antibiotics  Plan -Due to previous antibiotic dosing and ESRD pre-op dosing of vancomycin is not needed   Hildred Laser, Pharm D 11/08/2016 8:05 PM

## 2016-11-08 NOTE — ED Notes (Signed)
Dr. Loleta Books aware of patient blood pressure of 87/64 at this time.  To call if trending down.

## 2016-11-08 NOTE — Progress Notes (Addendum)
Pharmacy Antibiotic Note  Oscar Castillo is a 66 y.o. male with ESRD on HD admitted on 11/07/2016 with AMS/fevers/sepsis . ID has discontinued vancomycin and zosyn. Conultued pharmacy Cefazolin dosing for bacteremia.  BCID staph aureus detected , +MSSA   Plan: Cefazolin 2g IV x1 tonight then 2g IV after each HD qTTS  Height: 5\' 10"  (177.8 cm) Weight: 187 lb 6.4 oz (85 kg) IBW/kg (Calculated) : 73  Temp (24hrs), Avg:98.5 F (36.9 C), Min:98.1 F (36.7 C), Max:98.8 F (37.1 C)   Recent Labs Lab 11/08/16 0000 11/08/16 0030 11/08/16 0240 11/08/16 0753  WBC 6.1  --   --   --   CREATININE 8.26*  --   --   --   LATICACIDVEN  --  2.14* 1.76 1.2    Estimated Creatinine Clearance: 9.2 mL/min (A) (by C-G formula based on SCr of 8.26 mg/dL (H)).    No Known Allergies   Antimicrobial this admission:  Vanc 4/15>4/15 Zosyn 4/15>4/15 Cefazolin 4/15>>    Microbiology:  4/15 BCx : GPC clusters BCID staph aureus detected , +MSSA 4/15 MRSA PCR neg 4/15 blood x2 MRSA PCR- neg   Nicole Cella, RPh Clinical Pharmacist Pager: [phone] 11/08/2016 8:14 PM

## 2016-11-08 NOTE — Progress Notes (Signed)
PHARMACY - PHYSICIAN COMMUNICATION CRITICAL VALUE ALERT - BLOOD CULTURE IDENTIFICATION (BCID)  Results for orders placed or performed during the hospital encounter of 11/07/16  Blood Culture ID Panel (Reflexed) (Collected: 11/08/2016 12:12 AM)  Result Value Ref Range   Enterococcus species NOT DETECTED NOT DETECTED   Listeria monocytogenes NOT DETECTED NOT DETECTED   Staphylococcus species DETECTED (A) NOT DETECTED   Staphylococcus aureus DETECTED (A) NOT DETECTED   Methicillin resistance NOT DETECTED NOT DETECTED   Streptococcus species NOT DETECTED NOT DETECTED   Streptococcus agalactiae NOT DETECTED NOT DETECTED   Streptococcus pneumoniae NOT DETECTED NOT DETECTED   Streptococcus pyogenes NOT DETECTED NOT DETECTED   Acinetobacter baumannii NOT DETECTED NOT DETECTED   Enterobacteriaceae species NOT DETECTED NOT DETECTED   Enterobacter cloacae complex NOT DETECTED NOT DETECTED   Escherichia coli NOT DETECTED NOT DETECTED   Klebsiella oxytoca NOT DETECTED NOT DETECTED   Klebsiella pneumoniae NOT DETECTED NOT DETECTED   Proteus species NOT DETECTED NOT DETECTED   Serratia marcescens NOT DETECTED NOT DETECTED   Haemophilus influenzae NOT DETECTED NOT DETECTED   Neisseria meningitidis NOT DETECTED NOT DETECTED   Pseudomonas aeruginosa NOT DETECTED NOT DETECTED   Candida albicans NOT DETECTED NOT DETECTED   Candida glabrata NOT DETECTED NOT DETECTED   Candida krusei NOT DETECTED NOT DETECTED   Candida parapsilosis NOT DETECTED NOT DETECTED   Candida tropicalis NOT DETECTED NOT DETECTED    Name of physician (or Provider) Contacted: Paged Dr. Wynetta Emery but was unable to get in contact.  Changes to prescribed antibiotics required: Patient currently on vancomycin and zosyn. Recommend d/c vanc/zosyn and start cefazolin g IV q24h. Patient is ESRD on HD (MWF).  Dimitri Ped, PharmD, [place] PGY-2 Infectious Diseases Pharmacy Resident Pager: [phone] 11/08/2016  4:17 PM

## 2016-11-08 NOTE — Progress Notes (Signed)
ANTICOAGULATION CONSULT NOTE - Initial Consult  Pharmacy Consult for Warfarin  Indication: atrial fibrillation and stroke  No Known Allergies  Vital Signs: Temp: 98.8 F (37.1 C) (04/15 0504) Temp Source: Oral (04/15 0504) BP: 90/73 (04/15 0440) Pulse Rate: 66 (04/15 0440)  Labs:  Recent Labs  11/08/16 0000  HGB 12.9*  HCT 40.0  PLT 147*  LABPROT 28.8*  INR 2.65  CREATININE 8.26*    Medical History: Past Medical History:  Diagnosis Date  . Anemia   . Antral ulcer 2014   small  . Atrial fibrillation with RVR ([place]) 10/07/2016  . BACK PAIN, LUMBAR, CHRONIC   . BENIGN PROSTATIC HYPERTROPHY   . Bipolar disorder ([place])    "sometimes" (10/07/2016)  . CHOLELITHIASIS   . Chronic combined systolic and diastolic CHF (congestive heart failure) ([place])   . Complication of anesthesia    wife states pt had trouble waking up with his last surgery in Nov., 2014  . CVA (cerebral vascular accident) ([place])   . DEPRESSION   . DIABETES MELLITUS, TYPE II    "not anymore" (10/07/2016)  . ERECTILE DYSFUNCTION   . ESRD (end stage renal disease) on dialysis [facility])    "[place]; TTS" (10/07/2016)  . ESRD on hemodialysis ([place])    ESRD due to DM/HTN. Started dialysis in November 2013.  HD TTS at [facility] on [address].  [name] GERD   . GI bleed    due to gastritis, discharged 10/02/16/notes 10/07/2016  . Headache    "monthly" (10/07/2016)  . Hemorrhoids   . Hepatitis C   . History of blood transfusion    "related to dialysis" (10/07/2016)  . History of Clostridium difficile   . History of kidney stones   . HYPERTENSION   . LBBB (left bundle branch block)   . Morbid obesity ([place])   . PAF (paroxysmal atrial fibrillation) ([place])    a. Dx 12/2015.    Assessment: 66 y/o M on warfarin for multiple indications, here with weakness x 2 days, INR is therapeutic at 2.65, Hgb 12.9, plts 147, ESRD on HD  PTA warfarin dose: 3.75 mg MWF, 2.5 mg all other days, recent changed to this dose at outpatient  anti-coag visit  Goal of Therapy:  INR 2-3 Monitor platelets by anticoagulation protocol: Yes   Plan:  -Warfarin per PTA regimen -Daily PT/INR -Monitor for bleeding  Narda Bonds 11/08/2016,5:28 AM

## 2016-11-08 NOTE — Progress Notes (Signed)
Pharmacy Antibiotic Note  Oscar Castillo is a 66 y.o. male with ESRD on HD admitted on 11/07/2016 with AMS/fevers/sepsis .  Pharmacy has been consulted for Vancomycin and Zosyn  dosing.  Vancomycin 1 g IV given in ED at  [place]: Vancomycin 500 mg IV now for total of 1500 mg tonight, then Vancomycin 750 mg IV after each HD Zosyn 3.375 g IV q12h   Height: 5\' 11"  (180.3 cm) (with prosthesis in place) Weight: 210 lb (95.3 kg) IBW/kg (Calculated) : 75.3  Temp (24hrs), Avg:98.7 F (37.1 C), Min:98.7 F (37.1 C), Max:98.7 F (37.1 C)   Recent Labs Lab 11/08/16 0000 11/08/16 0030 11/08/16 0240  WBC 6.1  --   --   CREATININE 8.26*  --   --   LATICACIDVEN  --  2.14* 1.76    Estimated Creatinine Clearance: 10.5 mL/min (A) (by C-G formula based on SCr of 8.26 mg/dL (H)).    No Known Allergies .  Caryl Pina 11/08/2016 4:17 AM

## 2016-11-09 ENCOUNTER — Encounter (HOSPITAL_COMMUNITY)
Admission: EM | Disposition: A | Discharge status: Home Health | Payer: Self-pay | Source: Home / Self Care | Attending: Family Medicine

## 2016-11-09 ENCOUNTER — Inpatient Hospital Stay (HOSPITAL_COMMUNITY): Payer: Medicare Other

## 2016-11-09 ENCOUNTER — Inpatient Hospital Stay (HOSPITAL_COMMUNITY): Payer: Medicare Other | Admitting: Anesthesiology

## 2016-11-09 ENCOUNTER — Encounter (HOSPITAL_COMMUNITY): Payer: Self-pay | Admitting: Certified Registered Nurse Anesthetist

## 2016-11-09 DIAGNOSIS — Z89511 Acquired absence of right leg below knee: Secondary | ICD-10-CM

## 2016-11-09 DIAGNOSIS — A4101 Sepsis due to Methicillin susceptible Staphylococcus aureus: Secondary | ICD-10-CM

## 2016-11-09 DIAGNOSIS — Z794 Long term (current) use of insulin: Secondary | ICD-10-CM

## 2016-11-09 DIAGNOSIS — Z89512 Acquired absence of left leg below knee: Secondary | ICD-10-CM

## 2016-11-09 DIAGNOSIS — N186 End stage renal disease: Secondary | ICD-10-CM

## 2016-11-09 DIAGNOSIS — Z992 Dependence on renal dialysis: Secondary | ICD-10-CM

## 2016-11-09 DIAGNOSIS — Z833 Family history of diabetes mellitus: Secondary | ICD-10-CM

## 2016-11-09 DIAGNOSIS — Z8249 Family history of ischemic heart disease and other diseases of the circulatory system: Secondary | ICD-10-CM

## 2016-11-09 DIAGNOSIS — Z79899 Other long term (current) drug therapy: Secondary | ICD-10-CM

## 2016-11-09 DIAGNOSIS — E1122 Type 2 diabetes mellitus with diabetic chronic kidney disease: Secondary | ICD-10-CM

## 2016-11-09 HISTORY — PX: INSERTION OF DIALYSIS CATHETER: SHX1324

## 2016-11-09 HISTORY — PX: LIGATION OF ARTERIOVENOUS  FISTULA: SHX5948

## 2016-11-09 LAB — BPAM FFP
BLOOD PRODUCT EXPIRATION DATE: 201804162359
BLOOD PRODUCT EXPIRATION DATE: 201804162359
ISSUE DATE / TIME: 201804152110
ISSUE DATE / TIME: 201804152255
UNIT TYPE AND RH: 6200
Unit Type and Rh: 6200

## 2016-11-09 LAB — GLUCOSE, CAPILLARY
GLUCOSE-CAPILLARY: 83 mg/dL (ref 65–99)
Glucose-Capillary: 120 mg/dL — ABNORMAL HIGH (ref 65–99)
Glucose-Capillary: 92 mg/dL (ref 65–99)
Glucose-Capillary: 97 mg/dL (ref 65–99)

## 2016-11-09 LAB — PREPARE FRESH FROZEN PLASMA
Unit division: 0
Unit division: 0

## 2016-11-09 LAB — CBC
HCT: 33.8 % — ABNORMAL LOW (ref 39.0–52.0)
Hemoglobin: 10.9 g/dL — ABNORMAL LOW (ref 13.0–17.0)
MCH: 29.4 pg (ref 26.0–34.0)
MCHC: 32.2 g/dL (ref 30.0–36.0)
MCV: 91.1 fL (ref 78.0–100.0)
PLATELETS: 152 10*3/uL (ref 150–400)
RBC: 3.71 MIL/uL — ABNORMAL LOW (ref 4.22–5.81)
RDW: 18.5 % — ABNORMAL HIGH (ref 11.5–15.5)
WBC: 5.2 10*3/uL (ref 4.0–10.5)

## 2016-11-09 LAB — RENAL FUNCTION PANEL
ALBUMIN: 2.7 g/dL — AB (ref 3.5–5.0)
ANION GAP: 12 (ref 5–15)
BUN: 43 mg/dL — ABNORMAL HIGH (ref 6–20)
CO2: 23 mmol/L (ref 22–32)
Calcium: 8.3 mg/dL — ABNORMAL LOW (ref 8.9–10.3)
Chloride: 101 mmol/L (ref 101–111)
Creatinine, Ser: 9.36 mg/dL — ABNORMAL HIGH (ref 0.61–1.24)
GFR calc non Af Amer: 5 mL/min — ABNORMAL LOW (ref >60)
GFR, EST AFRICAN AMERICAN: 6 mL/min — AB (ref >60)
GLUCOSE: 111 mg/dL — AB (ref 65–99)
POTASSIUM: 4.5 mmol/L (ref 3.5–5.1)
Phosphorus: 2.5 mg/dL (ref 2.5–4.6)
SODIUM: 136 mmol/L (ref 135–145)

## 2016-11-09 LAB — PROTIME-INR
INR: 2.16
INR: 1.62
PROTHROMBIN TIME: 24.4 s — AB (ref 11.4–15.2)
Prothrombin Time: 19.5 seconds — ABNORMAL HIGH (ref 11.4–15.2)

## 2016-11-09 SURGERY — LIGATION OF ARTERIOVENOUS  FISTULA
Anesthesia: General | Site: Arm Upper

## 2016-11-09 MED ORDER — HEPARIN SODIUM (PORCINE) 1000 UNIT/ML IJ SOLN
INTRAMUSCULAR | Status: AC
  Filled 2016-11-09: qty 1

## 2016-11-09 MED ORDER — PROPOFOL 10 MG/ML IV BOLUS
INTRAVENOUS | Status: DC | PRN
  Administered 2016-11-09: 160 mg via INTRAVENOUS

## 2016-11-09 MED ORDER — SODIUM CHLORIDE 0.9 % IV SOLN
INTRAVENOUS | Status: DC | PRN
  Administered 2016-11-09: 500 mL

## 2016-11-09 MED ORDER — FENTANYL CITRATE (PF) 250 MCG/5ML IJ SOLN
INTRAMUSCULAR | Status: AC
  Filled 2016-11-09: qty 5

## 2016-11-09 MED ORDER — LIDOCAINE HCL (CARDIAC) 20 MG/ML IV SOLN
INTRAVENOUS | Status: DC | PRN
  Administered 2016-11-09: 80 mg via INTRAVENOUS

## 2016-11-09 MED ORDER — 0.9 % SODIUM CHLORIDE (POUR BTL) OPTIME
TOPICAL | Status: DC | PRN
  Administered 2016-11-09: 1000 mL

## 2016-11-09 MED ORDER — FENTANYL CITRATE (PF) 100 MCG/2ML IJ SOLN
INTRAMUSCULAR | Status: DC | PRN
  Administered 2016-11-09: 25 ug via INTRAVENOUS
  Administered 2016-11-09: 50 ug via INTRAVENOUS
  Administered 2016-11-09 (×3): 25 ug via INTRAVENOUS

## 2016-11-09 MED ORDER — FENTANYL CITRATE (PF) 100 MCG/2ML IJ SOLN
25.0000 ug | INTRAMUSCULAR | Status: DC | PRN
  Administered 2016-11-09: 50 ug via INTRAVENOUS

## 2016-11-09 MED ORDER — FENTANYL CITRATE (PF) 100 MCG/2ML IJ SOLN
INTRAMUSCULAR | Status: AC
  Administered 2016-11-09: 50 ug via INTRAVENOUS
  Filled 2016-11-09: qty 2

## 2016-11-09 MED ORDER — SODIUM CHLORIDE 0.9 % IV SOLN: Freq: Once | INTRAVENOUS | Status: AC

## 2016-11-09 MED ORDER — PHENYLEPHRINE HCL 10 MG/ML IJ SOLN
INTRAMUSCULAR | Status: DC | PRN
Start: 2016-11-09 — End: 2016-11-09
  Administered 2016-11-09 (×4): 80 ug via INTRAVENOUS

## 2016-11-09 MED ORDER — PROMETHAZINE HCL 25 MG/ML IJ SOLN: 6.2500 mg | INTRAMUSCULAR | Status: DC | PRN

## 2016-11-09 MED ORDER — ONDANSETRON HCL 4 MG/2ML IJ SOLN
INTRAMUSCULAR | Status: DC | PRN
  Administered 2016-11-09: 4 mg via INTRAVENOUS

## 2016-11-09 MED ORDER — HYDRALAZINE HCL 20 MG/ML IJ SOLN
INTRAMUSCULAR | Status: AC
  Administered 2016-11-09: 10 mg
  Filled 2016-11-09: qty 1

## 2016-11-09 MED ORDER — PROPOFOL 10 MG/ML IV BOLUS
INTRAVENOUS | Status: AC
  Filled 2016-11-09: qty 20

## 2016-11-09 MED ORDER — MIDAZOLAM HCL 5 MG/5ML IJ SOLN
INTRAMUSCULAR | Status: DC | PRN
Start: 2016-11-09 — End: 2016-11-09
  Administered 2016-11-09: 1 mg via INTRAVENOUS

## 2016-11-09 MED ORDER — HEPARIN SODIUM (PORCINE) 1000 UNIT/ML IJ SOLN
INTRAMUSCULAR | Status: DC | PRN
Start: 2016-11-09 — End: 2016-11-09
  Administered 2016-11-09: 3200 [IU]

## 2016-11-09 MED ORDER — PHENYLEPHRINE HCL 10 MG/ML IJ SOLN
INTRAVENOUS | Status: DC | PRN
  Administered 2016-11-09: 25 ug/min via INTRAVENOUS

## 2016-11-09 MED ORDER — SODIUM CHLORIDE 0.9 % IV SOLN
INTRAVENOUS | Status: DC
  Administered 2016-11-09 (×3): via INTRAVENOUS

## 2016-11-09 MED ORDER — MIDAZOLAM HCL 2 MG/2ML IJ SOLN
INTRAMUSCULAR | Status: AC
  Filled 2016-11-09: qty 2

## 2016-11-09 SURGICAL SUPPLY — 59 items
ADH SKN CLS APL DERMABOND .7 (GAUZE/BANDAGES/DRESSINGS) ×2
AGENT HMST SPONGE THK3/8 (HEMOSTASIS)
BAG DECANTER FOR FLEXI CONT (MISCELLANEOUS) ×2 IMPLANT
BANDAGE ACE 4X5 VEL STRL LF (GAUZE/BANDAGES/DRESSINGS) ×1 IMPLANT
BIOPATCH RED 1 DISK 7.0 (GAUZE/BANDAGES/DRESSINGS) ×3 IMPLANT
BNDG GAUZE ELAST 4 BULKY (GAUZE/BANDAGES/DRESSINGS) ×1 IMPLANT
CANISTER SUCT 3000ML PPV (MISCELLANEOUS) ×3 IMPLANT
CATH PALINDROME RT-P 15FX19CM (CATHETERS) IMPLANT
CATH PALINDROME RT-P 15FX23CM (CATHETERS) IMPLANT
CATH PALINDROME RT-P 15FX28CM (CATHETERS) IMPLANT
CATH PALINDROME RT-P 15FX55CM (CATHETERS) IMPLANT
CATH STRAIGHT 5FR 65CM (CATHETERS) IMPLANT
CATH TRIALYSIS 20CM 13F 3LUM (CATHETERS) ×1 IMPLANT
CHLORAPREP W/TINT 26ML (MISCELLANEOUS) ×3 IMPLANT
COVER PROBE W GEL 5X96 (DRAPES) ×1 IMPLANT
COVER SURGICAL LIGHT HANDLE (MISCELLANEOUS) ×2 IMPLANT
DECANTER SPIKE VIAL GLASS SM (MISCELLANEOUS) IMPLANT
DERMABOND ADVANCED (GAUZE/BANDAGES/DRESSINGS) ×1
DERMABOND ADVANCED .7 DNX12 (GAUZE/BANDAGES/DRESSINGS) ×2 IMPLANT
DRAPE C-ARM 42X72 X-RAY (DRAPES) ×2 IMPLANT
DRAPE CHEST BREAST 15X10 FENES (DRAPES) ×3 IMPLANT
ELECT REM PT RETURN 9FT ADLT (ELECTROSURGICAL) ×3
ELECTRODE REM PT RTRN 9FT ADLT (ELECTROSURGICAL) ×2 IMPLANT
GAUZE SPONGE 4X4 12PLY STRL LF (GAUZE/BANDAGES/DRESSINGS) ×2 IMPLANT
GAUZE SPONGE 4X4 16PLY XRAY LF (GAUZE/BANDAGES/DRESSINGS) ×3 IMPLANT
GLOVE BIO SURGEON STRL SZ7.5 (GLOVE) ×3 IMPLANT
GOWN STRL REUS W/ TWL LRG LVL3 (GOWN DISPOSABLE) ×6 IMPLANT
GOWN STRL REUS W/TWL LRG LVL3 (GOWN DISPOSABLE) ×9
HEMOSTAT SPONGE AVITENE ULTRA (HEMOSTASIS) IMPLANT
KIT BASIN OR (CUSTOM PROCEDURE TRAY) ×3 IMPLANT
KIT ROOM TURNOVER OR (KITS) ×3 IMPLANT
LOOP VESSEL MINI RED (MISCELLANEOUS) IMPLANT
NDL 18GX1X1/2 (RX/OR ONLY) (NEEDLE) ×2 IMPLANT
NDL HYPO 25GX1X1/2 BEV (NEEDLE) ×2 IMPLANT
NEEDLE 18GX1X1/2 (RX/OR ONLY) (NEEDLE) IMPLANT
NEEDLE HYPO 25GX1X1/2 BEV (NEEDLE) IMPLANT
NS IRRIG 1000ML POUR BTL (IV SOLUTION) ×3 IMPLANT
PACK CV ACCESS (CUSTOM PROCEDURE TRAY) ×3 IMPLANT
PACK SURGICAL SETUP 50X90 (CUSTOM PROCEDURE TRAY) ×2 IMPLANT
PAD ARMBOARD 7.5X6 YLW CONV (MISCELLANEOUS) ×6 IMPLANT
SET MICROPUNCTURE 5F STIFF (MISCELLANEOUS) IMPLANT
SUT ETHILON 3 0 PS 1 (SUTURE) ×3 IMPLANT
SUT PROLENE 5 0 C 1 24 (SUTURE) ×2 IMPLANT
SUT PROLENE 6 0 CC (SUTURE) ×1 IMPLANT
SUT SILK 0 (SUTURE) ×1 IMPLANT
SUT SILK 0 FSL (SUTURE) IMPLANT
SUT VIC AB 3-0 SH 27 (SUTURE) ×3
SUT VIC AB 3-0 SH 27X BRD (SUTURE) ×2 IMPLANT
SUT VICRYL 4-0 PS2 18IN ABS (SUTURE) ×3 IMPLANT
SYR 10ML LL (SYRINGE) ×3 IMPLANT
SYR 20CC LL (SYRINGE) ×6 IMPLANT
SYR 5ML LL (SYRINGE) ×3 IMPLANT
SYR CONTROL 10ML LL (SYRINGE) ×2 IMPLANT
TAPE CLOTH SURG 4X10 WHT LF (GAUZE/BANDAGES/DRESSINGS) ×1 IMPLANT
TOWEL OR 17X24 6PK STRL BLUE (TOWEL DISPOSABLE) ×2 IMPLANT
TOWEL OR 17X26 10 PK STRL BLUE (TOWEL DISPOSABLE) ×2 IMPLANT
UNDERPAD 30X30 (UNDERPADS AND DIAPERS) ×3 IMPLANT
WATER STERILE IRR 1000ML POUR (IV SOLUTION) ×2 IMPLANT
WIRE AMPLATZ SS-J .035X180CM (WIRE) IMPLANT

## 2016-11-09 NOTE — Interval H&P Note (Signed)
History and Physical Interval Note:  11/09/2016 3:37 PM  Oscar Castillo  has presented today for surgery, with the diagnosis of Removal Left Fistula  The various methods of treatment have been discussed with the patient and family. After consideration of risks, benefits and other options for treatment, the patient has consented to  Procedure(s): LIGATION OF ARTERIOVENOUS  FISTULA (Left) INSERTION OF DIALYSIS CATHETER (N/A) as a surgical intervention .  The patient's history has been reviewed, patient examined, no change in status, stable for surgery.  I have reviewed the patient's chart and labs.  Questions were answered to the patient's satisfaction.     Ruta Hinds

## 2016-11-09 NOTE — Progress Notes (Signed)
PROGRESS NOTE    Oscar Castillo  HKV:[id number]  DOB: [birth year]  DOA: 11/07/2016 PCP: Oscar Cower, MD Outpatient Specialists:   Hospital course: Oscar Castillo is a 66 y.o. male with a past medical history significant for ESRD on HD MWF, CHF EF 50%, Afib on amiodarone and warfarin, NIDDM and bilateral BKA who presents with fever, malaise.  He was admitted with sepsis thought secondary to infected left arm fistula/stent.  He has staph aureus bacteremia.  ID consulted.  VVS and nephrology following.  Pt to OR 4/16 for removal of stents and placement of temp dialysis catheter.     Assessment & Plan:   1. Sepsis secondary to infection of AVF and stents - Pt to OR today for stent removal and placement of temporary dialysis catheter.  Pt on IV cefazolin per ID.  Hemodynamics much improved.  Lactate trended down.  2. Staph aureus bacteremia - Pt switched to IV cefazolin per ID.  ID following.  Echo ordered.  Follow up blood cultures pending.   3. ESRD on HD TTS - nephrology team following.  4. Type 2 diabetes mellitus - blood sugars controlled, stable, following, continue current mgmt. 5. Hypertension - BP stable at baseline  6. Atrial Fib / flutter - Pt's warfarin on hold for procedure today, s/p FFP.  Rate controlled.  7. Bipolar / depression - Holding seroquel for now, resume as soon as possible.   DVT prophylaxis: warfarin Code Status:full  Family Communication: none present Disposition Plan: TBD  Consultants:  VVS  Nephrology  ID  Procedures:  4/16 Stent removal and temp dialysis catheter placement  Antimicrobials:  Cefazolin 4/15   Subjective: Pt without complaints.   Objective: Vitals:   11/09/16 0400 11/09/16 0645 11/09/16 0745 11/09/16 0803  BP:    (!) 160/88  Pulse: 78 79 78 79  Resp: 18 17 16 13   Temp: 98.8 F (37.1 C)   98 F (36.7 C)  TempSrc: Oral   Oral  SpO2: 100% 100% 100% 100%  Weight:      Height:        Intake/Output Summary (Last 24 hours) at  11/09/16 0831 Last data filed at 11/09/16 0645  Gross per 24 hour  Intake             1524 ml  Output                1 ml  Net             1523 ml   Filed Weights   11/08/16 0033 11/08/16 0504 11/08/16 0537  Weight: 95.3 kg (210 lb) 85 kg (187 lb 6.4 oz) 85 kg (187 lb 6.4 oz)    Exam:  General exam: chronically ill appearing, elderly male, NAD.  Respiratory system: No increased work of breathing. Cardiovascular system: S1 & S2 heard.  Gastrointestinal system: Abdomen is nondistended, soft and nontender. Normal bowel sounds heard. Central nervous system: Alert and oriented. No focal neurological deficits. Extremities: LUE bandages clean dry.  Data Reviewed: Basic Metabolic Panel:  Recent Labs Lab 11/08/16 0000 11/09/16 0316  NA 135 136  K 4.0 4.5  CL 93* 101  CO2 26 23  GLUCOSE 201* 111*  BUN 32* 43*  CREATININE 8.26* 9.36*  CALCIUM 8.9 8.3*  PHOS  --  2.5   Liver Function Tests:  Recent Labs Lab 11/08/16 0000 11/09/16 0316  AST 17  --   ALT 12*  --   ALKPHOS 44  --  BILITOT 0.8  --   PROT 7.4  --   ALBUMIN 3.3* 2.7*   No results for input(s): LIPASE, AMYLASE in the last 168 hours. No results for input(s): AMMONIA in the last 168 hours. CBC:  Recent Labs Lab 11/08/16 0000 11/09/16 0316  WBC 6.1 5.2  NEUTROABS 4.7  --   HGB 12.9* 10.9*  HCT 40.0 33.8*  MCV 93.0 91.1  PLT 147* 152   Cardiac Enzymes:  Recent Labs Lab 11/08/16 0753  TROPONINI 0.03*   CBG (last 3)   Recent Labs  11/08/16 1658 11/08/16 2111 11/09/16 0802  GLUCAP 166* 152* 120*   Recent Results (from the past 240 hour(s))  Culture, blood (Routine x 2)     Status: None (Preliminary result)   Collection Time: 11/08/16 12:12 AM  Result Value Ref Range Status   Specimen Description BLOOD RIGHT HAND  Final   Special Requests IN PEDIATRIC BOTTLE Blood Culture adequate volume  Final   Culture  Setup Time   Final    GRAM POSITIVE COCCI IN CLUSTERS IN PEDIATRIC  BOTTLE CRITICAL RESULT CALLED TO, READ BACK BY AND VERIFIED WITH: T STONE, PHARM, 11/08/16 AT 1616 BY J FUDESCO    Culture GRAM POSITIVE COCCI  Final   Report Status PENDING  Incomplete  Blood Culture ID Panel (Reflexed)     Status: Abnormal   Collection Time: 11/08/16 12:12 AM  Result Value Ref Range Status   Enterococcus species NOT DETECTED NOT DETECTED Final   Listeria monocytogenes NOT DETECTED NOT DETECTED Final   Staphylococcus species DETECTED (A) NOT DETECTED Final    Comment: CRITICAL RESULT CALLED TO, READ BACK BY AND VERIFIED WITH: T. STONE, PHARM, 11/08/16 AT 1616 BY J FUDESCO    Staphylococcus aureus DETECTED (A) NOT DETECTED Final    Comment: Methicillin (oxacillin) susceptible Staphylococcus aureus (MSSA). Preferred therapy is anti staphylococcal beta lactam antibiotic (Cefazolin or Nafcillin), unless clinically contraindicated. CRITICAL RESULT CALLED TO, READ BACK BY AND VERIFIED WITH: T. STONE, PHARM, 11/08/16 AT 1616 BY J FUDESCO    Methicillin resistance NOT DETECTED NOT DETECTED Final   Streptococcus species NOT DETECTED NOT DETECTED Final   Streptococcus agalactiae NOT DETECTED NOT DETECTED Final   Streptococcus pneumoniae NOT DETECTED NOT DETECTED Final   Streptococcus pyogenes NOT DETECTED NOT DETECTED Final   Acinetobacter baumannii NOT DETECTED NOT DETECTED Final   Enterobacteriaceae species NOT DETECTED NOT DETECTED Final   Enterobacter cloacae complex NOT DETECTED NOT DETECTED Final   Escherichia coli NOT DETECTED NOT DETECTED Final   Klebsiella oxytoca NOT DETECTED NOT DETECTED Final   Klebsiella pneumoniae NOT DETECTED NOT DETECTED Final   Proteus species NOT DETECTED NOT DETECTED Final   Serratia marcescens NOT DETECTED NOT DETECTED Final   Haemophilus influenzae NOT DETECTED NOT DETECTED Final   Neisseria meningitidis NOT DETECTED NOT DETECTED Final   Pseudomonas aeruginosa NOT DETECTED NOT DETECTED Final   Candida albicans NOT DETECTED NOT DETECTED  Final   Candida glabrata NOT DETECTED NOT DETECTED Final   Candida krusei NOT DETECTED NOT DETECTED Final   Candida parapsilosis NOT DETECTED NOT DETECTED Final   Candida tropicalis NOT DETECTED NOT DETECTED Final  MRSA PCR Screening     Status: None   Collection Time: 11/08/16  5:49 AM  Result Value Ref Range Status   MRSA by PCR NEGATIVE NEGATIVE Final    Comment:        The GeneXpert MRSA Assay (FDA approved for NASAL specimens only), is one component of a comprehensive  MRSA colonization surveillance program. It is not intended to diagnose MRSA infection nor to guide or monitor treatment for MRSA infections.      Studies: Dg Chest Port 1 View  Result Date: 11/09/2016 CLINICAL DATA:  Preoperative evaluation for stents in the left upper arm AV graft. EXAM: PORTABLE CHEST 1 VIEW COMPARISON:  11/08/2016 FINDINGS: Shallow inspiration. Heart size and pulmonary vascularity are normal. No focal airspace disease or consolidation in the lungs. No blunting of costophrenic angles. No pneumothorax. Stomach is mildly prominent with gas and shadows likely representing ingested material. Vascular graft is noted in the left upper arm. IMPRESSION: Shallow inspiration.  No evidence of active pulmonary disease. Electronically Signed   By: Lucienne Capers M.D.   On: 11/09/2016 00:40   Dg Chest Port 1 View  Result Date: 11/08/2016 CLINICAL DATA:  Acute onset of generalized weakness. Initial encounter. EXAM: PORTABLE CHEST 1 VIEW COMPARISON:  Chest radiograph performed 07/01/2016 FINDINGS: The lungs are well-aerated and clear. There is no evidence of focal opacification, pleural effusion or pneumothorax. The cardiomediastinal silhouette is within normal limits. No acute osseous abnormalities are seen. The esophagus appears to be filled with air. IMPRESSION: 1. No acute cardiopulmonary process seen. 2. Esophagus appears to be filled with air. Would correlate clinically for any evidence of esophageal  dysmotility or recent swallowing of air. Electronically Signed   By: Garald Balding M.D.   On: 11/08/2016 01:04   Dg Humerus Left  Result Date: 11/09/2016 CLINICAL DATA:  Preoperative evaluation of stents in the left upper arm AV graft. End-stage renal disease. EXAM: LEFT HUMERUS - 2+ VIEW COMPARISON:  None. FINDINGS: Vascular grafts demonstrated in the left upper arm. Diffuse vascular calcifications are present. Surgical clips in the antecubital fossa. No soft tissue gas collections demonstrated. Left humerus appears intact. No focal bone lesion or bone erosion. IMPRESSION: Postoperative changes demonstrated in the left upper arm with vascular grafts present. Diffuse vascular calcifications. Electronically Signed   By: Lucienne Capers M.D.   On: 11/09/2016 00:42   Scheduled Meds: . sodium chloride   Intravenous Once  . sodium chloride   Intravenous Once  . amiodarone  200 mg Oral Daily  . atorvastatin  40 mg Oral q1800  . calcium acetate  2,001 mg Oral TID WC  . [START ON 11/10/2016]  ceFAZolin (ANCEF) IV  2 g Intravenous Q T,Th,Sa-HD  . cinacalcet  30 mg Oral QPM  . [START ON 11/10/2016] doxercalciferol  2 mcg Intravenous Q T,Th,Sa-HD  . feeding supplement (NEPRO CARB STEADY)  237 mL Oral BID BM  . insulin aspart  0-5 Units Subcutaneous QHS  . insulin aspart  0-9 Units Subcutaneous TID WC  . multivitamin  1 tablet Oral QHS  . pantoprazole  40 mg Oral BID AC  . sodium chloride flush  3 mL Intravenous Q12H   Continuous Infusions:  Principal Problem:   Sepsis ([place]) Active Problems:   Hypertensive heart disease with CHF (congestive heart failure) (HCC)   Type 2 diabetes mellitus with chronic kidney disease on chronic dialysis, with long-term current use of insulin (HCC)   Chronic combined systolic and diastolic congestive heart failure   ESRD on hemodialysis (HCC)   S/P BKA (below knee amputation) bilateral (HCC)   Paroxysmal atrial fibrillation (HCC)   Anemia due to stage 5 chronic  kidney disease ([place])  Critical care Time spent: 39 mins  Oscar Brakeman, MD, FAAFP [facility] Pager [phone]  If 7PM-7AM, please contact night-coverage [email or web address] Password [facility] 11/09/2016, 8:31  AM    LOS: 1 day

## 2016-11-09 NOTE — Anesthesia Preprocedure Evaluation (Addendum)
Anesthesia Evaluation  Patient identified by MRN, date of birth, ID band Patient awake    Reviewed: Allergy & Precautions, NPO status , Patient's Chart, lab work & pertinent test results  Airway Mallampati: II  TM Distance: >3 FB Neck ROM: Full    Dental no notable dental hx.    Pulmonary neg pulmonary ROS,    Pulmonary exam normal breath sounds clear to auscultation       Cardiovascular hypertension, +CHF  Normal cardiovascular exam+ Valvular Problems/Murmurs MR  Rhythm:Regular Rate:Normal  Left ventricle: The cavity size was normal. There was mild   concentric hypertrophy. Systolic function was normal. The   estimated ejection fraction was in the range of 50% to 55%. Wall   motion was normal; there were no regional wall motion   abnormalities. The study was not technically sufficient to allow   evaluation of LV diastolic dysfunction due to atrial   fibrillation. - Ventricular septum: Septal motion showed moderate paradox. These   changes are consistent with intraventricular conduction delay. - Aortic valve: Severe diffuse thickening and calcification. Valve   mobility was mildly restricted. - Mitral valve: There was mild regurgitation.   Neuro/Psych Bipolar Disorder CVA    GI/Hepatic Neg liver ROS, GERD  ,  Endo/Other  diabetes  Renal/GU DialysisRenal disease  negative genitourinary   Musculoskeletal negative musculoskeletal ROS (+)   Abdominal   Peds negative pediatric ROS (+)  Hematology negative hematology ROS (+)   Anesthesia Other Findings   Reproductive/Obstetrics negative OB ROS                            Anesthesia Physical Anesthesia Plan  ASA: III  Anesthesia Plan: General   Post-op Pain Management:    Induction: Intravenous  Airway Management Planned: LMA  Additional Equipment:   Intra-op Plan:   Post-operative Plan: Extubation in OR  Informed Consent: I  have reviewed the patients History and Physical, chart, labs and discussed the procedure including the risks, benefits and alternatives for the proposed anesthesia with the patient or authorized representative who has indicated his/her understanding and acceptance.   Dental advisory given  Plan Discussed with: CRNA and Surgeon  Anesthesia Plan Comments:         Anesthesia Quick Evaluation

## 2016-11-09 NOTE — Progress Notes (Signed)
[facility]  Patient Status: Active (receiving services up to time of hospitalization)  [facility] is providing the following services: PT  If patient discharges after hours, please call [phone].   Janae Sauce 11/09/2016, 3:29 PM

## 2016-11-09 NOTE — Transfer of Care (Signed)
Immediate Anesthesia Transfer of Care Note  Patient: Oscar Castillo  Procedure(s) Performed: Procedure(s): LIGATION OF ARTERIOVENOUS  FISTULA (Left) INSERTION OF TEMPORARY DIALYSIS CATHETER (N/A)  Patient Location: PACU  Anesthesia Type:General  Level of Consciousness: awake and alert   Airway & Oxygen Therapy: Patient Spontanous Breathing and Patient connected to nasal cannula oxygen  Post-op Assessment: Report given to RN and Post -op Vital signs reviewed and stable  Post vital signs: Reviewed and stable  Last Vitals:  Vitals:   11/09/16 1552 11/09/16 1940  BP: (!) 158/85 (!) 189/98  Pulse: 75 87  Resp: 19 15  Temp: 37.5 C     Last Pain:  Vitals:   11/09/16 1552  TempSrc: Oral  PainSc:       Patients Stated Pain Goal: 0 (67/34/19 3790)  Complications: No apparent anesthesia complications

## 2016-11-09 NOTE — Progress Notes (Signed)
Echocardiogram 2D Echocardiogram has been performed.  11/09/2016 10:15 AM Maudry Mayhew, BS, RVT, RDCS, RDMS

## 2016-11-09 NOTE — Progress Notes (Signed)
Subjective  -  No acute events overnight   Physical Exam:  Dressing dry to fistula       Assessment/Plan:    Plan for removal of BVT  and catheter today by Dr. Oneida Alar. INR 2.1 after 2 units of FFP last night.  I will order an additional 2 units.  Discussed with nurse to give FFP ASAP and check coags as soon as FFP complete   Oscar Castillo, [place] 11/09/2016 9:02 AM --  Vitals:   11/09/16 0830 11/09/16 0845  BP: (!) 156/87 (!) 155/92  Pulse: 78 79  Resp: 17 16  Temp: 98 F (36.7 C) 98 F (36.7 C)    Intake/Output Summary (Last 24 hours) at 11/09/16 0902 Last data filed at 11/09/16 0845  Gross per 24 hour  Intake             1314 ml  Output                1 ml  Net             1313 ml     Laboratory CBC    Component Value Date/Time   WBC 5.2 11/09/2016 0316   HGB 10.9 (L) 11/09/2016 0316   HCT 33.8 (L) 11/09/2016 0316   PLT 152 11/09/2016 0316    BMET    Component Value Date/Time   NA 136 11/09/2016 0316   K 4.5 11/09/2016 0316   CL 101 11/09/2016 0316   CO2 23 11/09/2016 0316   GLUCOSE 111 (H) 11/09/2016 0316   BUN 43 (H) 11/09/2016 0316   CREATININE 9.36 (H) 11/09/2016 0316   CREATININE 8.97 (H) 10/07/2015 1500   CALCIUM 8.3 (L) 11/09/2016 0316   GFRNONAA 5 (L) 11/09/2016 0316   GFRNONAA 6 (L) 10/07/2015 1500   GFRAA 6 (L) 11/09/2016 0316   GFRAA 6 (L) 10/07/2015 1500    COAG Lab Results  Component Value Date   INR 2.16 11/09/2016   INR 2.65 11/08/2016   INR 3.9 11/04/2016   No results found for: PTT  Antibiotics Anti-infectives    Start     Dose/Rate Route Frequency Ordered Stop   11/10/16 1200  ceFAZolin (ANCEF) IVPB 2g/100 mL premix     2 g 200 mL/hr over 30 Minutes Intravenous Every T-Th-Sa (Hemodialysis) 11/08/16 1824     11/09/16 1200  vancomycin (VANCOCIN) IVPB 750 mg/150 ml premix  Status:  Discontinued     750 mg 150 mL/hr over 60 Minutes Intravenous Every M-W-F (Hemodialysis) 11/08/16 0421 11/08/16 1758   11/09/16 0000   vancomycin (VANCOCIN) IVPB 1000 mg/200 mL premix  Status:  Discontinued     1,000 mg 200 mL/hr over 60 Minutes Intravenous To Surgery 11/08/16 1949 11/08/16 1959   11/08/16 1915  ceFAZolin (ANCEF) IVPB 2g/100 mL premix     2 g 200 mL/hr over 30 Minutes Intravenous  Once 11/08/16 1824 11/08/16 2055   11/08/16 1000  piperacillin-tazobactam (ZOSYN) IVPB 3.375 g  Status:  Discontinued     3.375 g 12.5 mL/hr over 240 Minutes Intravenous Every 12 hours 11/08/16 0421 11/08/16 1758   11/08/16 0500  vancomycin (VANCOCIN) 500 mg in sodium chloride 0.9 % 100 mL IVPB     500 mg 100 mL/hr over 60 Minutes Intravenous  Once 11/08/16 0421 11/08/16 0637   11/08/16 0030  piperacillin-tazobactam (ZOSYN) IVPB 3.375 g     3.375 g 100 mL/hr over 30 Minutes Intravenous  Once 11/08/16 0017 11/08/16 0109   11/08/16 0030  vancomycin (VANCOCIN) 1,500 mg in sodium chloride 0.9 % 500 mL IVPB  Status:  Discontinued     1,500 mg 250 mL/hr over 120 Minutes Intravenous  Once 11/08/16 0018 11/08/16 0027   11/08/16 0030  vancomycin (VANCOCIN) IVPB 1000 mg/200 mL premix     1,000 mg 200 mL/hr over 60 Minutes Intravenous  Once 11/08/16 0028 11/08/16 0142   11/08/16 0015  vancomycin (VANCOCIN) IVPB 1000 mg/200 mL premix  Status:  Discontinued     1,000 mg 200 mL/hr over 60 Minutes Intravenous  Once 11/08/16 0014 11/08/16 0017       V. Leia Alf, M.D. [facility] Office: [phone] Pager:  [phone]

## 2016-11-09 NOTE — Anesthesia Postprocedure Evaluation (Addendum)
Anesthesia Post Note  Patient: YEUDIEL MATEO  Procedure(s) Performed: Procedure(s) (LRB): LIGATION OF ARTERIOVENOUS  FISTULA (Left) INSERTION OF TEMPORARY DIALYSIS CATHETER (N/A)  Patient location during evaluation: PACU Anesthesia Type: General Level of consciousness: awake and alert Pain management: pain level controlled Vital Signs Assessment: post-procedure vital signs reviewed and stable Respiratory status: spontaneous breathing, nonlabored ventilation, respiratory function stable and patient connected to nasal cannula oxygen Cardiovascular status: blood pressure returned to baseline and stable Postop Assessment: no signs of nausea or vomiting Anesthetic complications: no       Last Vitals:  Vitals:   11/09/16 2029 11/09/16 2100  BP: (!) 151/90 (!) 157/88  Pulse: 83 80  Resp: 16 16  Temp: 36.7 C 36.5 C    Last Pain:  Vitals:   11/09/16 2100  TempSrc: Oral  PainSc: 4                  Levon Penning

## 2016-11-09 NOTE — Consult Note (Addendum)
[place] for Infectious Disease  Date of Admission:  11/07/2016  Date of Consult:  11/09/2016  Reason for Consult: Staph bacteremia Referring Physician: CHAMP  Impression/Recommendation Staph bacteremia Suspected infected L arm AVG VVS following Will repeat BCx in AM Will check TTE  ESRD Appreciate renal f/u  Vascular stent 10-06-16 Removed today  Hep C Treated 2017 RNA negative last 2 draws  Thank you so much for this interesting consult,   Bobby Rumpf (pager) [phone] [email or web address]  GARY GABRIELSEN is an 66 y.o. male.  HPI: 66 yo M with hx of ESRD, afib, NIDDM adm on 4-15 with malaise, fever. He received anbx (vanco/ceftaz) while at HD due to erythema on his access site. By pm of 4-15 he "soaked" his pajamas with sweat, was confused, and came to ED.  In ED he was hypotensive, lactate 2.14, he was started on vanco/zosyn.   Past Medical History:  Diagnosis Date  . Anemia   . Antral ulcer 2014   small  . Atrial fibrillation with RVR ([place]) 10/07/2016  . BACK PAIN, LUMBAR, CHRONIC   . BENIGN PROSTATIC HYPERTROPHY   . Bipolar disorder ([place])    "sometimes" (10/07/2016)  . CHOLELITHIASIS   . Chronic combined systolic and diastolic CHF (congestive heart failure) ([place])   . Complication of anesthesia    wife states pt had trouble waking up with his last surgery in Nov., 2014  . CVA (cerebral vascular accident) ([place])   . DEPRESSION   . DIABETES MELLITUS, TYPE II    "not anymore" (10/07/2016)  . ERECTILE DYSFUNCTION   . ESRD (end stage renal disease) on dialysis [facility])    "[place]; TTS" (10/07/2016)  . ESRD on hemodialysis ([place])    ESRD due to DM/HTN. Started dialysis in November 2013.  HD TTS at [facility] on [address].  [name] GERD   . GI bleed    due to gastritis, discharged 10/02/16/notes 10/07/2016  . Headache    "monthly" (10/07/2016)  . Hemorrhoids   . Hepatitis C   . History of blood transfusion    "related to dialysis" (10/07/2016)  .  History of Clostridium difficile   . History of kidney stones   . HYPERTENSION   . LBBB (left bundle branch block)   . Morbid obesity ([place])   . PAF (paroxysmal atrial fibrillation) ([place])    a. Dx 12/2015.    Past Surgical History:  Procedure Laterality Date  . AMPUTATION Left 05/12/2013   Procedure: AMPUTATION RAY;  Surgeon: Newt Minion, MD;  Location: [place];  Service: Orthopedics;  Laterality: Left;  Left Foot 1st Ray Amputation  . AMPUTATION Left 06/09/2013   Procedure: AMPUTATION BELOW KNEE;  Surgeon: Newt Minion, MD;  Location: [place];  Service: Orthopedics;  Laterality: Left;  Left Below Knee Amputation and removal proximal screws IM tibial nail  . AMPUTATION Right 09/08/2013   Procedure: AMPUTATION BELOW KNEE;  Surgeon: Newt Minion, MD;  Location: [place];  Service: Orthopedics;  Laterality: Right;  Right Below Knee Amputation  . AMPUTATION Right 10/11/2013   Procedure: AMPUTATION BELOW KNEE;  Surgeon: Newt Minion, MD;  Location: [place];  Service: Orthopedics;  Laterality: Right;  Right Below Knee Amputation Revision  . AV FISTULA PLACEMENT  06/14/2012   Procedure: ARTERIOVENOUS (AV) FISTULA CREATION;  Surgeon: Angelia Mould, MD;  Location: [facility] OR;  Service: Vascular;  Laterality: Left;  Left basilic vein transposition with fistula.  . COLONOSCOPY N/A 10/28/2012  Procedure: COLONOSCOPY;  Surgeon: Jeryl Columbia, MD;  Location: [facility] ENDOSCOPY;  Service: Endoscopy;  Laterality: N/A;  . COLONOSCOPY N/A 11/02/2012   Procedure: COLONOSCOPY;  Surgeon: Cleotis Nipper, MD;  Location: [facility] ENDOSCOPY;  Service: Endoscopy;  Laterality: N/A;  . COLONOSCOPY N/A 11/03/2012   Procedure: COLONOSCOPY;  Surgeon: Cleotis Nipper, MD;  Location: [facility] ENDOSCOPY;  Service: Endoscopy;  Laterality: N/A;  . COLONOSCOPY N/A 09/16/2016   Procedure: COLONOSCOPY;  Surgeon: Jerene Bears, MD;  Location: [facility] ENDOSCOPY;  Service: Gastroenterology;  Laterality: N/A;  . ENTEROSCOPY N/A 11/08/2012   Procedure:  ENTEROSCOPY;  Surgeon: Wonda Horner, MD;  Location: [facility] ENDOSCOPY;  Service: Endoscopy;  Laterality: N/A;  . ESOPHAGOGASTRODUODENOSCOPY N/A 11/02/2012   Procedure: ESOPHAGOGASTRODUODENOSCOPY (EGD);  Surgeon: Cleotis Nipper, MD;  Location: [facility] ENDOSCOPY;  Service: Endoscopy;  Laterality: N/A;  . ESOPHAGOGASTRODUODENOSCOPY (EGD) WITH PROPOFOL N/A 09/16/2016   Procedure: ESOPHAGOGASTRODUODENOSCOPY (EGD) WITH PROPOFOL;  Surgeon: Jerene Bears, MD;  Location: [facility] ENDOSCOPY;  Service: Gastroenterology;  Laterality: N/A;  . EYE SURGERY Left    to remove scar tissue  . FRACTURE SURGERY    . GIVENS CAPSULE STUDY N/A 11/04/2012   Procedure: GIVENS CAPSULE STUDY;  Surgeon: Cleotis Nipper, MD;  Location: [facility] ENDOSCOPY;  Service: Endoscopy;  Laterality: N/A;  . GIVENS CAPSULE STUDY N/A 09/29/2016   Procedure: GIVENS CAPSULE STUDY;  Surgeon: Manus Gunning, MD;  Location: [place];  Service: Gastroenterology;  Laterality: N/A;  try to keep pt up in bedside chair as much as possible during the study.    [name] HARDWARE REMOVAL Left 06/09/2013   Procedure: HARDWARE REMOVAL;  Surgeon: Newt Minion, MD;  Location: [place];  Service: Orthopedics;  Laterality: Left;  Left Below Knee Amputation  and Removal proximal screws IM tibial nail  . IR GENERIC HISTORICAL  09/27/2016   IR US GUIDE VASC ACCESS RIGHT 09/27/2016 Aletta Edouard, MD [facility]INTER[facility]  . IR GENERIC HISTORICAL  09/27/2016   IR FLUORO GUIDE CV LINE RIGHT 09/27/2016 Aletta Edouard, MD [facility]  . NEPHRECTOMY     partial RR  . ORIF FIBULA FRACTURE Left 09/09/2012   Procedure: OPEN REDUCTION INTERNAL FIXATION (ORIF) FIBULA FRACTURE;  Surgeon: Johnny Bridge, MD;  Location: [place];  Service: Orthopedics;  Laterality: Left;  . TIBIA IM NAIL INSERTION Left 09/09/2012   Procedure: INTRAMEDULLARY (IM) NAIL TIBIAL;  Surgeon: Johnny Bridge, MD;  Location: [name];  Service: Orthopedics;  Laterality: Left;  left tibial nail and open reduction internal fixation left  fibula fracture     No Known Allergies  Medications:  Scheduled: . sodium chloride   Intravenous Once  . sodium chloride   Intravenous Once  . amiodarone  200 mg Oral Daily  . atorvastatin  40 mg Oral q1800  . calcium acetate  2,001 mg Oral TID WC  . [START ON 11/10/2016]  ceFAZolin (ANCEF) IV  2 g Intravenous Q T,Th,Sa-HD  . cinacalcet  30 mg Oral QPM  . [START ON 11/10/2016] doxercalciferol  2 mcg Intravenous Q T,Th,Sa-HD  . feeding supplement (NEPRO CARB STEADY)  237 mL Oral BID BM  . insulin aspart  0-5 Units Subcutaneous QHS  . insulin aspart  0-9 Units Subcutaneous TID WC  . multivitamin  1 tablet Oral QHS  . pantoprazole  40 mg Oral BID AC  . sodium chloride flush  3 mL Intravenous Q12H    Abtx:  Anti-infectives    Start     Dose/Rate Route  Frequency Ordered Stop   11/10/16 1200  ceFAZolin (ANCEF) IVPB 2g/100 mL premix     2 g 200 mL/hr over 30 Minutes Intravenous Every T-Th-Sa (Hemodialysis) 11/08/16 1824     11/09/16 1200  vancomycin (VANCOCIN) IVPB 750 mg/150 ml premix  Status:  Discontinued     750 mg 150 mL/hr over 60 Minutes Intravenous Every M-W-F (Hemodialysis) 11/08/16 0421 11/08/16 1758   11/09/16 0000  vancomycin (VANCOCIN) IVPB 1000 mg/200 mL premix  Status:  Discontinued     1,000 mg 200 mL/hr over 60 Minutes Intravenous To Surgery 11/08/16 1949 11/08/16 1959   11/08/16 1915  ceFAZolin (ANCEF) IVPB 2g/100 mL premix     2 g 200 mL/hr over 30 Minutes Intravenous  Once 11/08/16 1824 11/08/16 2055   11/08/16 1000  piperacillin-tazobactam (ZOSYN) IVPB 3.375 g  Status:  Discontinued     3.375 g 12.5 mL/hr over 240 Minutes Intravenous Every 12 hours 11/08/16 0421 11/08/16 1758   11/08/16 0500  vancomycin (VANCOCIN) 500 mg in sodium chloride 0.9 % 100 mL IVPB     500 mg 100 mL/hr over 60 Minutes Intravenous  Once 11/08/16 0421 11/08/16 0637   11/08/16 0030  piperacillin-tazobactam (ZOSYN) IVPB 3.375 g     3.375 g 100 mL/hr over 30 Minutes Intravenous  Once  11/08/16 0017 11/08/16 0109   11/08/16 0030  vancomycin (VANCOCIN) 1,500 mg in sodium chloride 0.9 % 500 mL IVPB  Status:  Discontinued     1,500 mg 250 mL/hr over 120 Minutes Intravenous  Once 11/08/16 0018 11/08/16 0027   11/08/16 0030  vancomycin (VANCOCIN) IVPB 1000 mg/200 mL premix     1,000 mg 200 mL/hr over 60 Minutes Intravenous  Once 11/08/16 0028 11/08/16 0142   11/08/16 0015  vancomycin (VANCOCIN) IVPB 1000 mg/200 mL premix  Status:  Discontinued     1,000 mg 200 mL/hr over 60 Minutes Intravenous  Once 11/08/16 0014 11/08/16 0017      Total days of antibiotics: 1 ancef/rifampin          Social History:  reports that he has never smoked. He has never used smokeless tobacco. He reports that he does not drink alcohol or use drugs.  Family History  Problem Relation Age of Onset  . Diabetes Mother   . Hypertension Mother   . Heart attack Father   . Hypertension Father   . Coronary artery disease Other     General ROS: normal BM daily, makes urine, denies vision change. denies neuropathy.  Please see HPI. 12 point ROS o/w (-)  Blood pressure (!) 153/82, pulse 79, temperature 98.1 F (36.7 C), temperature source Oral, resp. rate 20, height _0  (1.778 m), weight 85 kg (187 lb 6.4 oz), SpO2 99 %. General appearance: alert, cooperative, no distress and mental status 3/3 (person, place, date, president) Eyes: negative findings: pupils equal, round, reactive to light and accomodation, positive findings: sclera B arcus senilus Throat: normal findings: oropharynx pink & moist without lesions or evidence of thrush Neck: no adenopathy and supple, symmetrical, trachea midline Lungs: clear to auscultation bilaterally Heart: regular rate and rhythm Abdomen: normal findings: bowel sounds normal and soft, non-tender Extremities: B BKA. normal light touch. ulcer on LUE AVG. no d/c. no bleeding.    Results for orders placed or performed during the hospital encounter of 11/07/16 (from  the past 48 hour(s))  Comprehensive metabolic panel     Status: Abnormal   Collection Time: 11/08/16 12:00 AM  Result Value Ref Range  Sodium 135 135 - 145 mmol/L   Potassium 4.0 3.5 - 5.1 mmol/L   Chloride 93 (L) 101 - 111 mmol/L   CO2 26 22 - 32 mmol/L   Glucose, Bld 201 (H) 65 - 99 mg/dL   BUN 32 (H) 6 - 20 mg/dL   Creatinine, Ser 8.26 (H) 0.61 - 1.24 mg/dL   Calcium 8.9 8.9 - 10.3 mg/dL   Total Protein 7.4 6.5 - 8.1 g/dL   Albumin 3.3 (L) 3.5 - 5.0 g/dL   AST 17 15 - 41 U/L   ALT 12 (L) 17 - 63 U/L   Alkaline Phosphatase 44 38 - 126 U/L   Total Bilirubin 0.8 0.3 - 1.2 mg/dL   GFR calc non Af Amer 6 (L) >60 mL/min   GFR calc Af Amer 7 (L) >60 mL/min    Comment: (NOTE) The eGFR has been calculated using the CKD EPI equation. This calculation has not been validated in all clinical situations. eGFR's persistently <60 mL/min signify possible Chronic Kidney Disease.    Anion gap 16 (H) 5 - 15  CBC with Differential     Status: Abnormal   Collection Time: 11/08/16 12:00 AM  Result Value Ref Range   WBC 6.1 4.0 - 10.5 K/uL   RBC 4.30 4.22 - 5.81 MIL/uL   Hemoglobin 12.9 (L) 13.0 - 17.0 g/dL   HCT 40.0 39.0 - 52.0 %   MCV 93.0 78.0 - 100.0 fL   MCH 30.0 26.0 - 34.0 pg   MCHC 32.3 30.0 - 36.0 g/dL   RDW 18.9 (H) 11.5 - 15.5 %   Platelets 147 (L) 150 - 400 K/uL   Neutrophils Relative % 77 %   Neutro Abs 4.7 1.7 - 7.7 K/uL   Lymphocytes Relative 11 %   Lymphs Abs 0.7 0.7 - 4.0 K/uL   Monocytes Relative 9 %   Monocytes Absolute 0.6 0.1 - 1.0 K/uL   Eosinophils Relative 3 %   Eosinophils Absolute 0.2 0.0 - 0.7 K/uL   Basophils Relative 0 %   Basophils Absolute 0.0 0.0 - 0.1 K/uL  Protime-INR     Status: Abnormal   Collection Time: 11/08/16 12:00 AM  Result Value Ref Range   Prothrombin Time 28.8 (H) 11.4 - 15.2 seconds   INR 2.65   Culture, blood (Routine x 2)     Status: Abnormal (Preliminary result)   Collection Time: 11/08/16 12:12 AM  Result Value Ref Range    Specimen Description BLOOD RIGHT HAND    Special Requests IN PEDIATRIC BOTTLE Blood Culture adequate volume    Culture  Setup Time      GRAM POSITIVE COCCI IN CLUSTERS IN PEDIATRIC BOTTLE CRITICAL RESULT CALLED TO, READ BACK BY AND VERIFIED WITH: T STONE, PHARM, 11/08/16 AT 1616 BY J FUDESCO    Culture STAPHYLOCOCCUS AUREUS (A)    Report Status PENDING   Blood Culture ID Panel (Reflexed)     Status: Abnormal   Collection Time: 11/08/16 12:12 AM  Result Value Ref Range   Enterococcus species NOT DETECTED NOT DETECTED   Listeria monocytogenes NOT DETECTED NOT DETECTED   Staphylococcus species DETECTED (A) NOT DETECTED    Comment: CRITICAL RESULT CALLED TO, READ BACK BY AND VERIFIED WITH: T. STONE, PHARM, 11/08/16 AT 1616 BY J FUDESCO    Staphylococcus aureus DETECTED (A) NOT DETECTED    Comment: Methicillin (oxacillin) susceptible Staphylococcus aureus (MSSA). Preferred therapy is anti staphylococcal beta lactam antibiotic (Cefazolin or Nafcillin), unless clinically contraindicated. CRITICAL RESULT CALLED  TO, READ BACK BY AND VERIFIED WITH: T. STONE, PHARM, 11/08/16 AT 1616 BY J FUDESCO    Methicillin resistance NOT DETECTED NOT DETECTED   Streptococcus species NOT DETECTED NOT DETECTED   Streptococcus agalactiae NOT DETECTED NOT DETECTED   Streptococcus pneumoniae NOT DETECTED NOT DETECTED   Streptococcus pyogenes NOT DETECTED NOT DETECTED   Acinetobacter baumannii NOT DETECTED NOT DETECTED   Enterobacteriaceae species NOT DETECTED NOT DETECTED   Enterobacter cloacae complex NOT DETECTED NOT DETECTED   Escherichia coli NOT DETECTED NOT DETECTED   Klebsiella oxytoca NOT DETECTED NOT DETECTED   Klebsiella pneumoniae NOT DETECTED NOT DETECTED   Proteus species NOT DETECTED NOT DETECTED   Serratia marcescens NOT DETECTED NOT DETECTED   Haemophilus influenzae NOT DETECTED NOT DETECTED   Neisseria meningitidis NOT DETECTED NOT DETECTED   Pseudomonas aeruginosa NOT DETECTED NOT DETECTED    Candida albicans NOT DETECTED NOT DETECTED   Candida glabrata NOT DETECTED NOT DETECTED   Candida krusei NOT DETECTED NOT DETECTED   Candida parapsilosis NOT DETECTED NOT DETECTED   Candida tropicalis NOT DETECTED NOT DETECTED  I-Stat CG4 Lactic Acid, ED     Status: Abnormal   Collection Time: 11/08/16 12:30 AM  Result Value Ref Range   Lactic Acid, Venous 2.14 (HH) 0.5 - 1.9 mmol/L   Comment NOTIFIED PHYSICIAN   I-Stat CG4 Lactic Acid, ED     Status: None   Collection Time: 11/08/16  2:40 AM  Result Value Ref Range   Lactic Acid, Venous 1.76 0.5 - 1.9 mmol/L  MRSA PCR Screening     Status: None   Collection Time: 11/08/16  5:49 AM  Result Value Ref Range   MRSA by PCR NEGATIVE NEGATIVE    Comment:        The GeneXpert MRSA Assay (FDA approved for NASAL specimens only), is one component of a comprehensive MRSA colonization surveillance program. It is not intended to diagnose MRSA infection nor to guide or monitor treatment for MRSA infections.   Glucose, capillary     Status: Abnormal   Collection Time: 11/08/16  7:42 AM  Result Value Ref Range   Glucose-Capillary 117 (H) 65 - 99 mg/dL  Lactic acid, plasma     Status: None   Collection Time: 11/08/16  7:53 AM  Result Value Ref Range   Lactic Acid, Venous 1.2 0.5 - 1.9 mmol/L  Troponin I     Status: Abnormal   Collection Time: 11/08/16  7:53 AM  Result Value Ref Range   Troponin I 0.03 (HH) <0.03 ng/mL    Comment: CRITICAL RESULT CALLED TO, READ BACK BY AND VERIFIED WITH: T.[facility] RN @ [phone] 11/08/16 BY C.EDENS   Glucose, capillary     Status: Abnormal   Collection Time: 11/08/16 11:58 AM  Result Value Ref Range   Glucose-Capillary 146 (H) 65 - 99 mg/dL  Glucose, capillary     Status: Abnormal   Collection Time: 11/08/16  4:58 PM  Result Value Ref Range   Glucose-Capillary 166 (H) 65 - 99 mg/dL  Prepare fresh frozen plasma     Status: None   Collection Time: 11/08/16  7:47 PM  Result Value Ref Range   Unit Number  [id number]    Blood Component Type THAWED PLASMA    Unit division 00    Status of Unit ISSUED,FINAL    Transfusion Status OK TO TRANSFUSE    Unit Number [id number]    Blood Component Type THAWED PLASMA    Unit division 00  Status of Unit ISSUED,FINAL    Transfusion Status OK TO TRANSFUSE   Glucose, capillary     Status: Abnormal   Collection Time: 11/08/16  9:11 PM  Result Value Ref Range   Glucose-Capillary 152 (H) 65 - 99 mg/dL  Renal function panel     Status: Abnormal   Collection Time: 11/09/16  3:16 AM  Result Value Ref Range   Sodium 136 135 - 145 mmol/L   Potassium 4.5 3.5 - 5.1 mmol/L   Chloride 101 101 - 111 mmol/L   CO2 23 22 - 32 mmol/L   Glucose, Bld 111 (H) 65 - 99 mg/dL   BUN 43 (H) 6 - 20 mg/dL   Creatinine, Ser 9.36 (H) 0.61 - 1.24 mg/dL   Calcium 8.3 (L) 8.9 - 10.3 mg/dL   Phosphorus 2.5 2.5 - 4.6 mg/dL   Albumin 2.7 (L) 3.5 - 5.0 g/dL   GFR calc non Af Amer 5 (L) >60 mL/min   GFR calc Af Amer 6 (L) >60 mL/min    Comment: (NOTE) The eGFR has been calculated using the CKD EPI equation. This calculation has not been validated in all clinical situations. eGFR's persistently <60 mL/min signify possible Chronic Kidney Disease.    Anion gap 12 5 - 15  CBC     Status: Abnormal   Collection Time: 11/09/16  3:16 AM  Result Value Ref Range   WBC 5.2 4.0 - 10.5 K/uL   RBC 3.71 (L) 4.22 - 5.81 MIL/uL   Hemoglobin 10.9 (L) 13.0 - 17.0 g/dL   HCT 33.8 (L) 39.0 - 52.0 %   MCV 91.1 78.0 - 100.0 fL   MCH 29.4 26.0 - 34.0 pg   MCHC 32.2 30.0 - 36.0 g/dL   RDW 18.5 (H) 11.5 - 15.5 %   Platelets 152 150 - 400 K/uL  Protime-INR     Status: Abnormal   Collection Time: 11/09/16  3:16 AM  Result Value Ref Range   Prothrombin Time 24.4 (H) 11.4 - 15.2 seconds   INR 2.16   Prepare fresh frozen plasma     Status: None (Preliminary result)   Collection Time: 11/09/16  7:51 AM  Result Value Ref Range   Unit Number [id number]    Blood Component Type THAWED  PLASMA    Unit division 00    Status of Unit ISSUED    Transfusion Status OK TO TRANSFUSE    Unit Number [id number]    Blood Component Type THAWED PLASMA    Unit division 00    Status of Unit ISSUED    Transfusion Status OK TO TRANSFUSE   Glucose, capillary     Status: Abnormal   Collection Time: 11/09/16  8:02 AM  Result Value Ref Range   Glucose-Capillary 120 (H) 65 - 99 mg/dL      Component Value Date/Time   SDES BLOOD RIGHT HAND 11/08/2016 0012   SPECREQUEST IN PEDIATRIC BOTTLE Blood Culture adequate volume 11/08/2016 0012   CULT STAPHYLOCOCCUS AUREUS (A) 11/08/2016 0012   REPTSTATUS PENDING 11/08/2016 0012   Dg Chest Port 1 View  Result Date: 11/09/2016 CLINICAL DATA:  Preoperative evaluation for stents in the left upper arm AV graft. EXAM: PORTABLE CHEST 1 VIEW COMPARISON:  11/08/2016 FINDINGS: Shallow inspiration. Heart size and pulmonary vascularity are normal. No focal airspace disease or consolidation in the lungs. No blunting of costophrenic angles. No pneumothorax. Stomach is mildly prominent with gas and shadows likely representing ingested material. Vascular graft is noted in the left upper  arm. IMPRESSION: Shallow inspiration.  No evidence of active pulmonary disease. Electronically Signed   By: Lucienne Capers M.D.   On: 11/09/2016 00:40   Dg Chest Port 1 View  Result Date: 11/08/2016 CLINICAL DATA:  Acute onset of generalized weakness. Initial encounter. EXAM: PORTABLE CHEST 1 VIEW COMPARISON:  Chest radiograph performed 07/01/2016 FINDINGS: The lungs are well-aerated and clear. There is no evidence of focal opacification, pleural effusion or pneumothorax. The cardiomediastinal silhouette is within normal limits. No acute osseous abnormalities are seen. The esophagus appears to be filled with air. IMPRESSION: 1. No acute cardiopulmonary process seen. 2. Esophagus appears to be filled with air. Would correlate clinically for any evidence of esophageal dysmotility or  recent swallowing of air. Electronically Signed   By: Garald Balding M.D.   On: 11/08/2016 01:04   Dg Humerus Left  Result Date: 11/09/2016 CLINICAL DATA:  Preoperative evaluation of stents in the left upper arm AV graft. End-stage renal disease. EXAM: LEFT HUMERUS - 2+ VIEW COMPARISON:  None. FINDINGS: Vascular grafts demonstrated in the left upper arm. Diffuse vascular calcifications are present. Surgical clips in the antecubital fossa. No soft tissue gas collections demonstrated. Left humerus appears intact. No focal bone lesion or bone erosion. IMPRESSION: Postoperative changes demonstrated in the left upper arm with vascular grafts present. Diffuse vascular calcifications. Electronically Signed   By: Lucienne Capers M.D.   On: 11/09/2016 00:42   Recent Results (from the past 240 hour(s))  Culture, blood (Routine x 2)     Status: Abnormal (Preliminary result)   Collection Time: 11/08/16 12:12 AM  Result Value Ref Range Status   Specimen Description BLOOD RIGHT HAND  Final   Special Requests IN PEDIATRIC BOTTLE Blood Culture adequate volume  Final   Culture  Setup Time   Final    GRAM POSITIVE COCCI IN CLUSTERS IN PEDIATRIC BOTTLE CRITICAL RESULT CALLED TO, READ BACK BY AND VERIFIED WITH: T STONE, PHARM, 11/08/16 AT 1616 BY J FUDESCO    Culture STAPHYLOCOCCUS AUREUS (A)  Final   Report Status PENDING  Incomplete  Blood Culture ID Panel (Reflexed)     Status: Abnormal   Collection Time: 11/08/16 12:12 AM  Result Value Ref Range Status   Enterococcus species NOT DETECTED NOT DETECTED Final   Listeria monocytogenes NOT DETECTED NOT DETECTED Final   Staphylococcus species DETECTED (A) NOT DETECTED Final    Comment: CRITICAL RESULT CALLED TO, READ BACK BY AND VERIFIED WITH: T. STONE, PHARM, 11/08/16 AT 1616 BY J FUDESCO    Staphylococcus aureus DETECTED (A) NOT DETECTED Final    Comment: Methicillin (oxacillin) susceptible Staphylococcus aureus (MSSA). Preferred therapy is anti  staphylococcal beta lactam antibiotic (Cefazolin or Nafcillin), unless clinically contraindicated. CRITICAL RESULT CALLED TO, READ BACK BY AND VERIFIED WITH: T. STONE, PHARM, 11/08/16 AT 1616 BY J FUDESCO    Methicillin resistance NOT DETECTED NOT DETECTED Final   Streptococcus species NOT DETECTED NOT DETECTED Final   Streptococcus agalactiae NOT DETECTED NOT DETECTED Final   Streptococcus pneumoniae NOT DETECTED NOT DETECTED Final   Streptococcus pyogenes NOT DETECTED NOT DETECTED Final   Acinetobacter baumannii NOT DETECTED NOT DETECTED Final   Enterobacteriaceae species NOT DETECTED NOT DETECTED Final   Enterobacter cloacae complex NOT DETECTED NOT DETECTED Final   Escherichia coli NOT DETECTED NOT DETECTED Final   Klebsiella oxytoca NOT DETECTED NOT DETECTED Final   Klebsiella pneumoniae NOT DETECTED NOT DETECTED Final   Proteus species NOT DETECTED NOT DETECTED Final   Serratia marcescens NOT DETECTED  NOT DETECTED Final   Haemophilus influenzae NOT DETECTED NOT DETECTED Final   Neisseria meningitidis NOT DETECTED NOT DETECTED Final   Pseudomonas aeruginosa NOT DETECTED NOT DETECTED Final   Candida albicans NOT DETECTED NOT DETECTED Final   Candida glabrata NOT DETECTED NOT DETECTED Final   Candida krusei NOT DETECTED NOT DETECTED Final   Candida parapsilosis NOT DETECTED NOT DETECTED Final   Candida tropicalis NOT DETECTED NOT DETECTED Final  MRSA PCR Screening     Status: None   Collection Time: 11/08/16  5:49 AM  Result Value Ref Range Status   MRSA by PCR NEGATIVE NEGATIVE Final    Comment:        The GeneXpert MRSA Assay (FDA approved for NASAL specimens only), is one component of a comprehensive MRSA colonization surveillance program. It is not intended to diagnose MRSA infection nor to guide or monitor treatment for MRSA infections.       11/09/2016, 10:38 AM     LOS: 1 day    Records and images were personally reviewed where available.

## 2016-11-09 NOTE — Progress Notes (Signed)
Subjective:  No cos, alert now , awaiting VVS  Surgery   Objective Vital signs in last 24 hours: Vitals:   11/09/16 0830 11/09/16 0845 11/09/16 0945 11/09/16 1000  BP: (!) 156/87 (!) 155/92 (!) 153/82 (!) 153/82  Pulse: 78 79 79 79  Resp: 17 16 17 20   Temp: 98 F (36.7 C) 98 F (36.7 C) 98.1 F (36.7 C) 98.1 F (36.7 C)  TempSrc: Oral Oral Oral Oral  SpO2: 100% 99% 100% 99%  Weight:      Height:       Weight change:   Physical Exam: General:  AAM NAD , OX3 .  Lungs: CTA  nonlabored breathing  Heart: RRR no mur, or rub .  Abdomen: soft NT + BS Lower extremities:  Bilateral BKA with prosthetic liners on Dialysis Access: left upper AVF + bruit / clean dry dressing not removed   Dialysis Orders:  TTS [place] 4 hr EDW 83 2 K 2.5 Ca profile 4 heparin 2800 left upper AVF Hectorol 2 no ESA or Fe Recent labs: hgb 14.2 4/12 38% sat Ca/P ok iPTH 362 Recent antibiotics: 1.75 gm Lucianne Lei 4/14 and 2 gm Tressie [place] 4/14 prior to admission  Problem/Plan 1. Sepsis - likely secondary to infected AVF/stent - VVS / ID following. /VVS plans surgery today =removal of stents in AVF and Perm cath placement when INR stable for surgery  2. ESRD -  TTS -K =4.5  3. Hypertension/volume  - BP now at  Baseline/ Was  hypotensive upon admission and received 1 L of fluid - CXR clear upon  admission - essentially kept even yesterday at dialysis - came in at EDW - now 2kg over with BED WT (Bilat BKAS) jhad left a little below edw Monday and Wed at  OP unit .- may need lowering slightly at d/c 4. Anemia  - hgb 12.9 >10.9 follow  Trend.- off ESA last dose was 75 on 4/3 - last tsat 39%- follow trend 5. Metabolic bone disease -   phos 2.5  Ca. Corrected 9.3 on hectorol/ binders/sensipar/ decr. calcium acetate to 1 ac  6. DM - per primary 7. Nutrition -renal carb mod diet - vits/nepro 8. Hx afib/aflutter - NSR now - on amiodarone and chronic coumadin - /per admit  9. Hx GIB  Ernest Haber, PA-C [place] [phone] 11/09/2016,11:46 AM  LOS: 1 day   Pt seen, examined and agree w A/P as above. Pt has +blood cx's and probably should have temp cath placed (as opposed to tunneled catheter)  in surgery today. Will d/w VVS.  Kelly Splinter MD [facility] pager [phone]   11/09/2016, 3:58 PM    Labs: Basic Metabolic Panel:  Recent Labs Lab 11/08/16 0000 11/09/16 0316  NA 135 136  K 4.0 4.5  CL 93* 101  CO2 26 23  GLUCOSE 201* 111*  BUN 32* 43*  CREATININE 8.26* 9.36*  CALCIUM 8.9 8.3*  PHOS  --  2.5   Liver Function Tests:  Recent Labs Lab 11/08/16 0000 11/09/16 0316  AST 17  --   ALT 12*  --   ALKPHOS 44  --   BILITOT 0.8  --   PROT 7.4  --   ALBUMIN 3.3* 2.7*   No results for input(s): LIPASE, AMYLASE in the last 168 hours. No results for input(s): AMMONIA in the last 168 hours. CBC:  Recent Labs Lab 11/08/16 0000 11/09/16 0316  WBC 6.1 5.2  NEUTROABS 4.7  --  HGB 12.9* 10.9*  HCT 40.0 33.8*  MCV 93.0 91.1  PLT 147* 152   Cardiac Enzymes:  Recent Labs Lab 11/08/16 0753  TROPONINI 0.03*   CBG:  Recent Labs Lab 11/08/16 0742 11/08/16 1158 11/08/16 1658 11/08/16 2111 11/09/16 0802  GLUCAP 117* 146* 166* 152* 120*    Studies/Results: Dg Chest Port 1 View  Result Date: 11/09/2016 CLINICAL DATA:  Preoperative evaluation for stents in the left upper arm AV graft. EXAM: PORTABLE CHEST 1 VIEW COMPARISON:  11/08/2016 FINDINGS: Shallow inspiration. Heart size and pulmonary vascularity are normal. No focal airspace disease or consolidation in the lungs. No blunting of costophrenic angles. No pneumothorax. Stomach is mildly prominent with gas and shadows likely representing ingested material. Vascular graft is noted in the left upper arm. IMPRESSION: Shallow inspiration.  No evidence of active pulmonary disease. Electronically Signed   By: Lucienne Capers M.D.   On: 11/09/2016 00:40   Dg Chest Port 1 View  Result Date:  11/08/2016 CLINICAL DATA:  Acute onset of generalized weakness. Initial encounter. EXAM: PORTABLE CHEST 1 VIEW COMPARISON:  Chest radiograph performed 07/01/2016 FINDINGS: The lungs are well-aerated and clear. There is no evidence of focal opacification, pleural effusion or pneumothorax. The cardiomediastinal silhouette is within normal limits. No acute osseous abnormalities are seen. The esophagus appears to be filled with air. IMPRESSION: 1. No acute cardiopulmonary process seen. 2. Esophagus appears to be filled with air. Would correlate clinically for any evidence of esophageal dysmotility or recent swallowing of air. Electronically Signed   By: Garald Balding M.D.   On: 11/08/2016 01:04   Dg Humerus Left  Result Date: 11/09/2016 CLINICAL DATA:  Preoperative evaluation of stents in the left upper arm AV graft. End-stage renal disease. EXAM: LEFT HUMERUS - 2+ VIEW COMPARISON:  None. FINDINGS: Vascular grafts demonstrated in the left upper arm. Diffuse vascular calcifications are present. Surgical clips in the antecubital fossa. No soft tissue gas collections demonstrated. Left humerus appears intact. No focal bone lesion or bone erosion. IMPRESSION: Postoperative changes demonstrated in the left upper arm with vascular grafts present. Diffuse vascular calcifications. Electronically Signed   By: Lucienne Capers M.D.   On: 11/09/2016 00:42   Medications:  . sodium chloride   Intravenous Once  . sodium chloride   Intravenous Once  . amiodarone  200 mg Oral Daily  . atorvastatin  40 mg Oral q1800  . calcium acetate  2,001 mg Oral TID WC  . [START ON 11/10/2016]  ceFAZolin (ANCEF) IV  2 g Intravenous Q T,Th,Sa-HD  . cinacalcet  30 mg Oral QPM  . [START ON 11/10/2016] doxercalciferol  2 mcg Intravenous Q T,Th,Sa-HD  . feeding supplement (NEPRO CARB STEADY)  237 mL Oral BID BM  . insulin aspart  0-5 Units Subcutaneous QHS  . insulin aspart  0-9 Units Subcutaneous TID WC  . multivitamin  1 tablet Oral  QHS  . pantoprazole  40 mg Oral BID AC  . sodium chloride flush  3 mL Intravenous Q12H

## 2016-11-09 NOTE — Anesthesia Procedure Notes (Signed)
Procedure Name: LMA Insertion Date/Time: 11/09/2016 5:15 PM Performed by: Clearnce Sorrel Pre-anesthesia Checklist: Patient identified, Emergency Drugs available, Suction available, Patient being monitored and Timeout performed Patient Re-evaluated:Patient Re-evaluated prior to inductionOxygen Delivery Method: Circle system utilized Preoxygenation: Pre-oxygenation with 100% oxygen Intubation Type: IV induction LMA: LMA with gastric port inserted LMA Size: 5.0 Number of attempts: 1 Placement Confirmation: positive ETCO2 and breath sounds checked- equal and bilateral Tube secured with: Tape Dental Injury: Teeth and Oropharynx as per pre-operative assessment

## 2016-11-09 NOTE — Consult Note (Signed)
Pt INR still > 2 at 3 am today.  Will give an additional 2 U FFP.  Plan removal of stents and place catheter later today. Will need INR check post transfusion this morning  Ruta Hinds, MD [facility] Office: [phone] Pager: [phone]

## 2016-11-09 NOTE — H&P (View-Only) (Signed)
Subjective  -  No acute events overnight   Physical Exam:  Dressing dry to fistula       Assessment/Plan:    Plan for removal of BVT  and catheter today by Dr. Oneida Alar. INR 2.1 after 2 units of FFP last night.  I will order an additional 2 units.  Discussed with nurse to give FFP ASAP and check coags as soon as FFP complete   Oscar Castillo, Oscar Castillo 11/09/2016 9:02 AM --  Vitals:   11/09/16 0830 11/09/16 0845  BP: (!) 156/87 (!) 155/92  Pulse: 78 79  Resp: 17 16  Temp: 98 F (36.7 C) 98 F (36.7 C)    Intake/Output Summary (Last 24 hours) at 11/09/16 0902 Last data filed at 11/09/16 0845  Gross per 24 hour  Intake             1314 ml  Output                1 ml  Net             1313 ml     Laboratory CBC    Component Value Date/Time   WBC 5.2 11/09/2016 0316   HGB 10.9 (L) 11/09/2016 0316   HCT 33.8 (L) 11/09/2016 0316   PLT 152 11/09/2016 0316    BMET    Component Value Date/Time   NA 136 11/09/2016 0316   K 4.5 11/09/2016 0316   CL 101 11/09/2016 0316   CO2 23 11/09/2016 0316   GLUCOSE 111 (H) 11/09/2016 0316   BUN 43 (H) 11/09/2016 0316   CREATININE 9.36 (H) 11/09/2016 0316   CREATININE 8.97 (H) 10/07/2015 1500   CALCIUM 8.3 (L) 11/09/2016 0316   GFRNONAA 5 (L) 11/09/2016 0316   GFRNONAA 6 (L) 10/07/2015 1500   GFRAA 6 (L) 11/09/2016 0316   GFRAA 6 (L) 10/07/2015 1500    COAG Lab Results  Component Value Date   INR 2.16 11/09/2016   INR 2.65 11/08/2016   INR 3.9 11/04/2016   No results found for: PTT  Antibiotics Anti-infectives    Start     Dose/Rate Route Frequency Ordered Stop   11/10/16 1200  ceFAZolin (ANCEF) IVPB 2g/100 mL premix     2 g 200 mL/hr over 30 Minutes Intravenous Every T-Th-Sa (Hemodialysis) 11/08/16 1824     11/09/16 1200  vancomycin (VANCOCIN) IVPB 750 mg/150 ml premix  Status:  Discontinued     750 mg 150 mL/hr over 60 Minutes Intravenous Every M-W-F (Hemodialysis) 11/08/16 0421 11/08/16 1758   11/09/16 0000   vancomycin (VANCOCIN) IVPB 1000 mg/200 mL premix  Status:  Discontinued     1,000 mg 200 mL/hr over 60 Minutes Intravenous To Surgery 11/08/16 1949 11/08/16 1959   11/08/16 1915  ceFAZolin (ANCEF) IVPB 2g/100 mL premix     2 g 200 mL/hr over 30 Minutes Intravenous  Once 11/08/16 1824 11/08/16 2055   11/08/16 1000  piperacillin-tazobactam (ZOSYN) IVPB 3.375 g  Status:  Discontinued     3.375 g 12.5 mL/hr over 240 Minutes Intravenous Every 12 hours 11/08/16 0421 11/08/16 1758   11/08/16 0500  vancomycin (VANCOCIN) 500 mg in sodium chloride 0.9 % 100 mL IVPB     500 mg 100 mL/hr over 60 Minutes Intravenous  Once 11/08/16 0421 11/08/16 0637   11/08/16 0030  piperacillin-tazobactam (ZOSYN) IVPB 3.375 g     3.375 g 100 mL/hr over 30 Minutes Intravenous  Once 11/08/16 0017 11/08/16 0109   11/08/16 0030  vancomycin (VANCOCIN) 1,500 mg in sodium chloride 0.9 % 500 mL IVPB  Status:  Discontinued     1,500 mg 250 mL/hr over 120 Minutes Intravenous  Once 11/08/16 0018 11/08/16 0027   11/08/16 0030  vancomycin (VANCOCIN) IVPB 1000 mg/200 mL premix     1,000 mg 200 mL/hr over 60 Minutes Intravenous  Once 11/08/16 0028 11/08/16 0142   11/08/16 0015  vancomycin (VANCOCIN) IVPB 1000 mg/200 mL premix  Status:  Discontinued     1,000 mg 200 mL/hr over 60 Minutes Intravenous  Once 11/08/16 0014 11/08/16 0017       V. Leia Alf, M.D. [facility] Office: [phone] Pager:  [phone]

## 2016-11-09 NOTE — Op Note (Signed)
Procedure: #1 insertion of left internal jugular vein temporary dialysis catheter #2 excision of left upper arm AV fistula  Preoperative diagnosis: #1 end-stage renal disease #2 infected left upper arm AV fistula  Anesthesia: Gen.  Assistant: Nurse  Operative findings: #1 PTFE covered stent with extrusion of stent strut material through the skin #2 cultures taken from intraluminal stent #3 distal left axillary vein ligated and proximal fistula ligated  Operative details:  After obtaining informed consent, the patient was taken the operating room. The patient was placed in supine position operating table. After induction of anesthesia and placement of a laryngeal mask, the patient's entire neck and chest were prepped and draped in usual sterile fashion.  Next ultrasound was used to inspect the right internal jugular vein. This was found to be thrombosed. Next ultrasound was used to identify the left internal jugular vein. This had normal compressibility and respiratory variation. An introducer needle was used to cannulate the left internal jugular vein and 35 J-tipped Were threaded through the left internal jugular vein down into the inferior vena cava under fluoroscopic guidance. The tract was then dilated and a trial dialysis catheter 20 cm in length placed over the guidewire down into the right atrium. This was sutured the skin with nylon suture. He was noted to flush and draw easily. It was loaded with concentrated heparin solution. A dressing was applied.   Attention was then turned to the patient's left upper extremity. Entire left upper extremity was prepped and draped in usual sterile fashion. A longitudinal incision was made in the patient's left axilla carried down through the saphenous tissues down to level the axillary vein. There was a PTFE covered stent within this. The vein was dissected free circumferentially just above the stent. Attention was then turned toward the more proximal arm.  About 5 cm above the antecubital crease and made a longitudinal incision carried down through subcutaneous tissues and got a fistula freed up in the distal upper arm. There was a palpable stent at this level too. I dissected the fistula freed just proximal to this. I then ligated the fistula and divided it at this level. It was ligated twice with a 0 silk tie and then oversewn with a running 5-0 Prolene suture. I then proceeded to dissect out this portion of the fistula using cautery extending the incision longitudinally to the upper arm about 7 cm. I then freed up the axillary portion and ligated the axillary vein just distal to the stent and then transected the vein below this. I then removed the entire segment of fistula and stent. The specimen was opened longitudinally and swabbed and sent for culture. The wounds were then thoroughly irrigated with normal saline solution. The subcutaneous tissues were closed in both incisions using a running 3-0 Vicryl suture. The skin of both incisions was closed with a 4-0 Vicryl subcuticular stitch. Dermabond was applied to the skin incisions. An Ace wrap was then applied. The patient tolerated the procedure well and there were no complications. Instrument sponge and needle count was correct in the case. The patient was taken the recovery room in stable condition. Chest x-ray will be taken in the recovery room for checking the catheter placement.  Ruta Hinds, MD [facility] Office: [phone] Pager: [phone]

## 2016-11-10 ENCOUNTER — Encounter (HOSPITAL_COMMUNITY): Payer: Self-pay | Admitting: Vascular Surgery

## 2016-11-10 LAB — GLUCOSE, CAPILLARY
GLUCOSE-CAPILLARY: 125 mg/dL — AB (ref 65–99)
GLUCOSE-CAPILLARY: 138 mg/dL — AB (ref 65–99)
GLUCOSE-CAPILLARY: 84 mg/dL (ref 65–99)
Glucose-Capillary: 133 mg/dL — ABNORMAL HIGH (ref 65–99)
Glucose-Capillary: 157 mg/dL — ABNORMAL HIGH (ref 65–99)

## 2016-11-10 LAB — PREPARE FRESH FROZEN PLASMA
UNIT DIVISION: 0
Unit division: 0

## 2016-11-10 LAB — RENAL FUNCTION PANEL
ALBUMIN: 2.6 g/dL — AB (ref 3.5–5.0)
Anion gap: 10 (ref 5–15)
BUN: 48 mg/dL — AB (ref 6–20)
CO2: 24 mmol/L (ref 22–32)
CREATININE: 10.6 mg/dL — AB (ref 0.61–1.24)
Calcium: 8.2 mg/dL — ABNORMAL LOW (ref 8.9–10.3)
Chloride: 100 mmol/L — ABNORMAL LOW (ref 101–111)
GFR calc non Af Amer: 4 mL/min — ABNORMAL LOW (ref >60)
GFR, EST AFRICAN AMERICAN: 5 mL/min — AB (ref >60)
Glucose, Bld: 118 mg/dL — ABNORMAL HIGH (ref 65–99)
POTASSIUM: 3.7 mmol/L (ref 3.5–5.1)
Phosphorus: 1.9 mg/dL — ABNORMAL LOW (ref 2.5–4.6)
Sodium: 134 mmol/L — ABNORMAL LOW (ref 135–145)

## 2016-11-10 LAB — CULTURE, BLOOD (ROUTINE X 2): SPECIAL REQUESTS: ADEQUATE

## 2016-11-10 LAB — BPAM FFP
BLOOD PRODUCT EXPIRATION DATE: 201804202359
Blood Product Expiration Date: 201804202359
ISSUE DATE / TIME: 201804160815
ISSUE DATE / TIME: 201804160815
UNIT TYPE AND RH: 6200
Unit Type and Rh: 6200

## 2016-11-10 LAB — CBC
HCT: 33.5 % — ABNORMAL LOW (ref 39.0–52.0)
Hemoglobin: 10.8 g/dL — ABNORMAL LOW (ref 13.0–17.0)
MCH: 29 pg (ref 26.0–34.0)
MCHC: 32.2 g/dL (ref 30.0–36.0)
MCV: 90.1 fL (ref 78.0–100.0)
Platelets: 155 10*3/uL (ref 150–400)
RBC: 3.72 MIL/uL — AB (ref 4.22–5.81)
RDW: 18.4 % — ABNORMAL HIGH (ref 11.5–15.5)
WBC: 4.9 10*3/uL (ref 4.0–10.5)

## 2016-11-10 LAB — PROTIME-INR
INR: 1.81
PROTHROMBIN TIME: 21.2 s — AB (ref 11.4–15.2)

## 2016-11-10 MED ORDER — DOXERCALCIFEROL 4 MCG/2ML IV SOLN
INTRAVENOUS | Status: AC
  Administered 2016-11-10: 2 ug via INTRAVENOUS
  Filled 2016-11-10: qty 2

## 2016-11-10 MED ORDER — QUETIAPINE FUMARATE 25 MG PO TABS
25.0000 mg | ORAL_TABLET | Freq: Two times a day (BID) | ORAL | Status: DC
  Administered 2016-11-10 – 2016-11-14 (×9): 25 mg via ORAL
  Filled 2016-11-10 (×9): qty 1

## 2016-11-10 NOTE — Progress Notes (Signed)
PROGRESS NOTE    Oscar Castillo  XVQ:[id number]  DOB: 14-Jan-1951  DOA: 11/07/2016 PCP: [name], MD Outpatient Specialists:   Hospital course: Oscar Castillo is a 66 y.o. male with a past medical history significant for ESRD on HD MWF, CHF EF 50%, Afib on amiodarone and warfarin, NIDDM and bilateral BKA who presents with fever, malaise.  He was admitted with sepsis thought secondary to infected left arm fistula/stent.  He has staph aureus bacteremia.  ID consulted.  VVS and nephrology following.  Pt to OR 4/16 for removal of stents and placement of temp dialysis catheter.     Assessment & Plan:   1. Sepsis secondary to infection of AVF - Pt taken to OR 4/16 for AVF removal and placement of temporary dialysis catheter.  Pt on IV cefazolin per ID.  Hemodynamics much improved.  Lactate trended down.  2. Staph aureus bacteremia - Pt switched to IV cefazolin per ID.  ID following.  Echo pending.  Follow up blood cultures 4/17 ngtd.   3. Post op s/p insertion of left IJ temp catheter and excision of LUE AVF - pt tolerated well. VVS following.  4. ESRD on HD TTS - nephrology team following.  Planning for HD 4/17 5. Type 2 diabetes mellitus - blood sugars controlled, stable, following, continue current mgmt. Monitor for lows. 6. Hypertension - BP stable at baseline  7. Atrial Fib / flutter - Pt's warfarin was stopped for surgery and patient had FFP given.  Restart anticoagulation when ok with surgery.  Rate controlled.  8. Bipolar / depression - restart home seroquel.   DVT prophylaxis: warfarin Code Status:full  Family Communication: none present Disposition Plan: TBD  Consultants:  VVS  Nephrology  ID  Procedures:  4/16 Stent removal and temp dialysis catheter placement  Antimicrobials:  Cefazolin 4/15   Subjective: Pt tolerated surgery well and no complaints this morning.   Objective: Vitals:   11/10/16 0500 11/10/16 0600 11/10/16 0700 11/10/16 0800  BP: 131/82 (!)  148/97 (!) 146/81 134/78  Pulse: 76 80 76 73  Resp: 16 18 15 16   Temp: 98.7 F (37.1 C)     TempSrc: Oral     SpO2: 100% 98% 100% 100%  Weight:      Height:        Intake/Output Summary (Last 24 hours) at 11/10/16 1118 Last data filed at 11/10/16 0119  Gross per 24 hour  Intake              860 ml  Output              200 ml  Net              660 ml   Filed Weights   11/08/16 0033 11/08/16 0504 11/08/16 0537  Weight: 95.3 kg (210 lb) 85 kg (187 lb 6.4 oz) 85 kg (187 lb 6.4 oz)    Exam:  General exam: chronically ill appearing, elderly male, NAD.  Respiratory system: No increased work of breathing. Cardiovascular system: S1 & S2 heard.  Gastrointestinal system: Abdomen is nondistended, soft and nontender. Normal bowel sounds heard. Central nervous system: Alert and oriented. No focal neurological deficits. Extremities: LUE bandages clean dry.  Data Reviewed: Basic Metabolic Panel:  Recent Labs Lab 11/08/16 0000 11/09/16 0316 11/10/16 0247  NA 135 136 134*  K 4.0 4.5 3.7  CL 93* 101 100*  CO2 26 23 24   GLUCOSE 201* 111* 118*  BUN 32* 43* 48*  CREATININE  8.26* 9.36* 10.60*  CALCIUM 8.9 8.3* 8.2*  PHOS  --  2.5 1.9*   Liver Function Tests:  Recent Labs Lab 11/08/16 0000 11/09/16 0316 11/10/16 0247  AST 17  --   --   ALT 12*  --   --   ALKPHOS 44  --   --   BILITOT 0.8  --   --   PROT 7.4  --   --   ALBUMIN 3.3* 2.7* 2.6*   No results for input(s): LIPASE, AMYLASE in the last 168 hours. No results for input(s): AMMONIA in the last 168 hours. CBC:  Recent Labs Lab 11/08/16 0000 11/09/16 0316 11/10/16 0247  WBC 6.1 5.2 4.9  NEUTROABS 4.7  --   --   HGB 12.9* 10.9* 10.8*  HCT 40.0 33.8* 33.5*  MCV 93.0 91.1 90.1  PLT 147* 152 155   Cardiac Enzymes:  Recent Labs Lab 11/08/16 0753  TROPONINI 0.03*   CBG (last 3)   Recent Labs  11/09/16 2006 11/10/16 0040 11/10/16 0856  GLUCAP 83 125* 84   Recent Results (from the past 240 hour(s))    Culture, blood (Routine x 2)     Status: None (Preliminary result)   Collection Time: 11/08/16 12:00 AM  Result Value Ref Range Status   Specimen Description BLOOD RIGHT ARM  Final   Special Requests   Final    BOTTLES DRAWN AEROBIC AND ANAEROBIC Blood Culture adequate volume   Culture NO GROWTH 2 DAYS  Final   Report Status PENDING  Incomplete  Culture, blood (Routine x 2)     Status: Abnormal   Collection Time: 11/08/16 12:12 AM  Result Value Ref Range Status   Specimen Description BLOOD RIGHT HAND  Final   Special Requests IN PEDIATRIC BOTTLE Blood Culture adequate volume  Final   Culture  Setup Time   Final    GRAM POSITIVE COCCI IN CLUSTERS IN PEDIATRIC BOTTLE CRITICAL RESULT CALLED TO, READ BACK BY AND VERIFIED WITH: T STONE, PHARM, 11/08/16 AT 1616 BY J FUDESCO    Culture STAPHYLOCOCCUS AUREUS (A)  Final   Report Status 11/10/2016 FINAL  Final   Organism ID, Bacteria STAPHYLOCOCCUS AUREUS  Final      Susceptibility   Staphylococcus aureus - MIC*    CIPROFLOXACIN <=0.5 SENSITIVE Sensitive     ERYTHROMYCIN >=8 RESISTANT Resistant     GENTAMICIN <=0.5 SENSITIVE Sensitive     OXACILLIN 0.5 SENSITIVE Sensitive     TETRACYCLINE <=1 SENSITIVE Sensitive     VANCOMYCIN <=0.5 SENSITIVE Sensitive     TRIMETH/SULFA <=10 SENSITIVE Sensitive     CLINDAMYCIN <=0.25 SENSITIVE Sensitive     RIFAMPIN <=0.5 SENSITIVE Sensitive     Inducible Clindamycin NEGATIVE Sensitive     * STAPHYLOCOCCUS AUREUS  Blood Culture ID Panel (Reflexed)     Status: Abnormal   Collection Time: 11/08/16 12:12 AM  Result Value Ref Range Status   Enterococcus species NOT DETECTED NOT DETECTED Final   Listeria monocytogenes NOT DETECTED NOT DETECTED Final   Staphylococcus species DETECTED (A) NOT DETECTED Final    Comment: CRITICAL RESULT CALLED TO, READ BACK BY AND VERIFIED WITH: T. STONE, PHARM, 11/08/16 AT 1616 BY J FUDESCO    Staphylococcus aureus DETECTED (A) NOT DETECTED Final    Comment: Methicillin  (oxacillin) susceptible Staphylococcus aureus (MSSA). Preferred therapy is anti staphylococcal beta lactam antibiotic (Cefazolin or Nafcillin), unless clinically contraindicated. CRITICAL RESULT CALLED TO, READ BACK BY AND VERIFIED WITH: T. STONE, PHARM, 11/08/16 AT 4128  BY J FUDESCO    Methicillin resistance NOT DETECTED NOT DETECTED Final   Streptococcus species NOT DETECTED NOT DETECTED Final   Streptococcus agalactiae NOT DETECTED NOT DETECTED Final   Streptococcus pneumoniae NOT DETECTED NOT DETECTED Final   Streptococcus pyogenes NOT DETECTED NOT DETECTED Final   Acinetobacter baumannii NOT DETECTED NOT DETECTED Final   Enterobacteriaceae species NOT DETECTED NOT DETECTED Final   Enterobacter cloacae complex NOT DETECTED NOT DETECTED Final   Escherichia coli NOT DETECTED NOT DETECTED Final   Klebsiella oxytoca NOT DETECTED NOT DETECTED Final   Klebsiella pneumoniae NOT DETECTED NOT DETECTED Final   Proteus species NOT DETECTED NOT DETECTED Final   Serratia marcescens NOT DETECTED NOT DETECTED Final   Haemophilus influenzae NOT DETECTED NOT DETECTED Final   Neisseria meningitidis NOT DETECTED NOT DETECTED Final   Pseudomonas aeruginosa NOT DETECTED NOT DETECTED Final   Candida albicans NOT DETECTED NOT DETECTED Final   Candida glabrata NOT DETECTED NOT DETECTED Final   Candida krusei NOT DETECTED NOT DETECTED Final   Candida parapsilosis NOT DETECTED NOT DETECTED Final   Candida tropicalis NOT DETECTED NOT DETECTED Final  MRSA PCR Screening     Status: None   Collection Time: 11/08/16  5:49 AM  Result Value Ref Range Status   MRSA by PCR NEGATIVE NEGATIVE Final    Comment:        The GeneXpert MRSA Assay (FDA approved for NASAL specimens only), is one component of a comprehensive MRSA colonization surveillance program. It is not intended to diagnose MRSA infection nor to guide or monitor treatment for MRSA infections.   Aerobic Culture (superficial specimen)     Status:  None (Preliminary result)   Collection Time: 11/09/16  7:05 PM  Result Value Ref Range Status   Specimen Description WOUND LEFT ARM  Final   Special Requests   Final    SWAB OF FLUID FROM STENT GRAFT LUMEN PT ON ANCEF, VANC   Gram Stain   Final    FEW WBC PRESENT,BOTH PMN AND MONONUCLEAR NO ORGANISMS SEEN    Culture PENDING  Incomplete   Report Status PENDING  Incomplete     Studies: Dg Chest Port 1 View  Result Date: 11/09/2016 CLINICAL DATA:  Central line placement EXAM: PORTABLE CHEST 1 VIEW COMPARISON:  11/08/2016 FINDINGS: Left-sided central venous catheter tip overlies the distal SVC. No left pneumothorax. Mild cardiomegaly with central vascular congestion. No focal consolidation IMPRESSION: Left-sided central venous catheter tip overlies the distal SVC. No left pneumothorax. Cardiomegaly with mild central vascular congestion Electronically Signed   By: Donavan Foil M.D.   On: 11/09/2016 20:01   Dg Chest Port 1 View  Result Date: 11/09/2016 CLINICAL DATA:  Preoperative evaluation for stents in the left upper arm AV graft. EXAM: PORTABLE CHEST 1 VIEW COMPARISON:  11/08/2016 FINDINGS: Shallow inspiration. Heart size and pulmonary vascularity are normal. No focal airspace disease or consolidation in the lungs. No blunting of costophrenic angles. No pneumothorax. Stomach is mildly prominent with gas and shadows likely representing ingested material. Vascular graft is noted in the left upper arm. IMPRESSION: Shallow inspiration.  No evidence of active pulmonary disease. Electronically Signed   By: Lucienne Capers M.D.   On: 11/09/2016 00:40   Dg Humerus Left  Result Date: 11/09/2016 CLINICAL DATA:  Preoperative evaluation of stents in the left upper arm AV graft. End-stage renal disease. EXAM: LEFT HUMERUS - 2+ VIEW COMPARISON:  None. FINDINGS: Vascular grafts demonstrated in the left upper arm. Diffuse vascular calcifications  are present. Surgical clips in the antecubital fossa. No soft  tissue gas collections demonstrated. Left humerus appears intact. No focal bone lesion or bone erosion. IMPRESSION: Postoperative changes demonstrated in the left upper arm with vascular grafts present. Diffuse vascular calcifications. Electronically Signed   By: Lucienne Capers M.D.   On: 11/09/2016 00:42   Scheduled Meds: . amiodarone  200 mg Oral Daily  . atorvastatin  40 mg Oral q1800  . calcium acetate  2,001 mg Oral TID WC  . cinacalcet  30 mg Oral QPM  . doxercalciferol  2 mcg Intravenous Q T,Th,Sa-HD  . feeding supplement (NEPRO CARB STEADY)  237 mL Oral BID BM  . insulin aspart  0-5 Units Subcutaneous QHS  . insulin aspart  0-9 Units Subcutaneous TID WC  . multivitamin  1 tablet Oral QHS  . pantoprazole  40 mg Oral BID AC  . sodium chloride flush  3 mL Intravenous Q12H   Continuous Infusions: . sodium chloride 10 mL/hr at 11/09/16 1622  .  ceFAZolin (ANCEF) IV      Principal Problem:   Sepsis ([place]) Active Problems:   Hypertensive heart disease with CHF (congestive heart failure) (HCC)   Type 2 diabetes mellitus with chronic kidney disease on chronic dialysis, with long-term current use of insulin (HCC)   Chronic combined systolic and diastolic congestive heart failure   ESRD on hemodialysis (HCC)   S/P BKA (below knee amputation) bilateral (HCC)   Paroxysmal atrial fibrillation (HCC)   Anemia due to stage 5 chronic kidney disease ([place])  Time spent: 24 mins  Irwin Brakeman, MD, FAAFP [facility] Pager [phone]  If 7PM-7AM, please contact night-coverage [email or web address] Password [email or web address] 11/10/2016, 11:18 AM    LOS: 2 days

## 2016-11-10 NOTE — Progress Notes (Addendum)
INFECTIOUS DISEASE PROGRESS NOTE  ID: Oscar Castillo is a 66 y.o. male with  Principal Problem:   Sepsis ([place]) Active Problems:   Hypertensive heart disease with CHF (congestive heart failure) ([place])   Type 2 diabetes mellitus with chronic kidney disease on chronic dialysis, with long-term current use of insulin (HCC)   Chronic combined systolic and diastolic congestive heart failure   ESRD on hemodialysis (HCC)   S/P BKA (below knee amputation) bilateral (HCC)   Paroxysmal atrial fibrillation (HCC)   Anemia due to stage 5 chronic kidney disease (HCC)  Subjective: c/o L arm pain, swelling.   Abtx:  Anti-infectives    Start     Dose/Rate Route Frequency Ordered Stop   11/10/16 1200  ceFAZolin (ANCEF) IVPB 2g/100 mL premix     2 g 200 mL/hr over 30 Minutes Intravenous Every T-Th-Sa (Hemodialysis) 11/08/16 1824     11/09/16 1200  vancomycin (VANCOCIN) IVPB 750 mg/150 ml premix  Status:  Discontinued     750 mg 150 mL/hr over 60 Minutes Intravenous Every M-W-F (Hemodialysis) 11/08/16 0421 11/08/16 1758   11/09/16 0000  vancomycin (VANCOCIN) IVPB 1000 mg/200 mL premix  Status:  Discontinued     1,000 mg 200 mL/hr over 60 Minutes Intravenous To Surgery 11/08/16 1949 11/08/16 1959   11/08/16 1915  ceFAZolin (ANCEF) IVPB 2g/100 mL premix     2 g 200 mL/hr over 30 Minutes Intravenous  Once 11/08/16 1824 11/08/16 2055   11/08/16 1000  piperacillin-tazobactam (ZOSYN) IVPB 3.375 g  Status:  Discontinued     3.375 g 12.5 mL/hr over 240 Minutes Intravenous Every 12 hours 11/08/16 0421 11/08/16 1758   11/08/16 0500  vancomycin (VANCOCIN) 500 mg in sodium chloride 0.9 % 100 mL IVPB     500 mg 100 mL/hr over 60 Minutes Intravenous  Once 11/08/16 0421 11/08/16 0637   11/08/16 0030  piperacillin-tazobactam (ZOSYN) IVPB 3.375 g     3.375 g 100 mL/hr over 30 Minutes Intravenous  Once 11/08/16 0017 11/08/16 0109   11/08/16 0030  vancomycin (VANCOCIN) 1,500 mg in sodium chloride 0.9 % 500 mL IVPB   Status:  Discontinued     1,500 mg 250 mL/hr over 120 Minutes Intravenous  Once 11/08/16 0018 11/08/16 0027   11/08/16 0030  vancomycin (VANCOCIN) IVPB 1000 mg/200 mL premix     1,000 mg 200 mL/hr over 60 Minutes Intravenous  Once 11/08/16 0028 11/08/16 0142   11/08/16 0015  vancomycin (VANCOCIN) IVPB 1000 mg/200 mL premix  Status:  Discontinued     1,000 mg 200 mL/hr over 60 Minutes Intravenous  Once 11/08/16 0014 11/08/16 0017      Medications:  Scheduled: . amiodarone  200 mg Oral Daily  . atorvastatin  40 mg Oral q1800  . cinacalcet  30 mg Oral QPM  . doxercalciferol  2 mcg Intravenous Q T,Th,Sa-HD  . feeding supplement (NEPRO CARB STEADY)  237 mL Oral BID BM  . insulin aspart  0-5 Units Subcutaneous QHS  . insulin aspart  0-9 Units Subcutaneous TID WC  . multivitamin  1 tablet Oral QHS  . pantoprazole  40 mg Oral BID AC  . QUEtiapine  25 mg Oral BID  . sodium chloride flush  3 mL Intravenous Q12H    Objective: Vital signs in last 24 hours: Temp:  [97.7 F (36.5 C)-99.5 F (37.5 C)] 98.7 F (37.1 C) (04/17 0500) Pulse Rate:  [73-87] 73 (04/17 0800) Resp:  [0-20] 16 (04/17 0800) BP: (131-189)/(78-98) 134/78 (04/17  0800) SpO2:  [97 %-100 %] 100 % (04/17 0800)   General appearance: alert, cooperative and no distress Resp: clear to auscultation bilaterally Cardio: regular rate and rhythm GI: normal findings: bowel sounds normal and soft, non-tender Extremities: LUE wound clean, closed. no d/c.   Lab Results  Recent Labs  11/09/16 0316 11/10/16 0247  WBC 5.2 4.9  HGB 10.9* 10.8*  HCT 33.8* 33.5*  NA 136 134*  K 4.5 3.7  CL 101 100*  CO2 23 24  BUN 43* 48*  CREATININE 9.36* 10.60*   Liver Panel  Recent Labs  11/08/16 0000 11/09/16 0316 11/10/16 0247  PROT 7.4  --   --   ALBUMIN 3.3* 2.7* 2.6*  AST 17  --   --   ALT 12*  --   --   ALKPHOS 44  --   --   BILITOT 0.8  --   --    Sedimentation Rate No results for input(s): ESRSEDRATE in the last  72 hours. C-Reactive Protein No results for input(s): CRP in the last 72 hours.  Microbiology: Recent Results (from the past 240 hour(s))  Culture, blood (Routine x 2)     Status: None (Preliminary result)   Collection Time: 11/08/16 12:00 AM  Result Value Ref Range Status   Specimen Description BLOOD RIGHT ARM  Final   Special Requests   Final    BOTTLES DRAWN AEROBIC AND ANAEROBIC Blood Culture adequate volume   Culture NO GROWTH 2 DAYS  Final   Report Status PENDING  Incomplete  Culture, blood (Routine x 2)     Status: Abnormal   Collection Time: 11/08/16 12:12 AM  Result Value Ref Range Status   Specimen Description BLOOD RIGHT HAND  Final   Special Requests IN PEDIATRIC BOTTLE Blood Culture adequate volume  Final   Culture  Setup Time   Final    GRAM POSITIVE COCCI IN CLUSTERS IN PEDIATRIC BOTTLE CRITICAL RESULT CALLED TO, READ BACK BY AND VERIFIED WITH: T STONE, PHARM, 11/08/16 AT 1616 BY J FUDESCO    Culture STAPHYLOCOCCUS AUREUS (A)  Final   Report Status 11/10/2016 FINAL  Final   Organism ID, Bacteria STAPHYLOCOCCUS AUREUS  Final      Susceptibility   Staphylococcus aureus - MIC*    CIPROFLOXACIN <=0.5 SENSITIVE Sensitive     ERYTHROMYCIN >=8 RESISTANT Resistant     GENTAMICIN <=0.5 SENSITIVE Sensitive     OXACILLIN 0.5 SENSITIVE Sensitive     TETRACYCLINE <=1 SENSITIVE Sensitive     VANCOMYCIN <=0.5 SENSITIVE Sensitive     TRIMETH/SULFA <=10 SENSITIVE Sensitive     CLINDAMYCIN <=0.25 SENSITIVE Sensitive     RIFAMPIN <=0.5 SENSITIVE Sensitive     Inducible Clindamycin NEGATIVE Sensitive     * STAPHYLOCOCCUS AUREUS  Blood Culture ID Panel (Reflexed)     Status: Abnormal   Collection Time: 11/08/16 12:12 AM  Result Value Ref Range Status   Enterococcus species NOT DETECTED NOT DETECTED Final   Listeria monocytogenes NOT DETECTED NOT DETECTED Final   Staphylococcus species DETECTED (A) NOT DETECTED Final    Comment: CRITICAL RESULT CALLED TO, READ BACK BY AND  VERIFIED WITH: T. STONE, PHARM, 11/08/16 AT 1616 BY J FUDESCO    Staphylococcus aureus DETECTED (A) NOT DETECTED Final    Comment: Methicillin (oxacillin) susceptible Staphylococcus aureus (MSSA). Preferred therapy is anti staphylococcal beta lactam antibiotic (Cefazolin or Nafcillin), unless clinically contraindicated. CRITICAL RESULT CALLED TO, READ BACK BY AND VERIFIED WITH: T. STONE, PHARM, 11/08/16 AT 7628  BY J FUDESCO    Methicillin resistance NOT DETECTED NOT DETECTED Final   Streptococcus species NOT DETECTED NOT DETECTED Final   Streptococcus agalactiae NOT DETECTED NOT DETECTED Final   Streptococcus pneumoniae NOT DETECTED NOT DETECTED Final   Streptococcus pyogenes NOT DETECTED NOT DETECTED Final   Acinetobacter baumannii NOT DETECTED NOT DETECTED Final   Enterobacteriaceae species NOT DETECTED NOT DETECTED Final   Enterobacter cloacae complex NOT DETECTED NOT DETECTED Final   Escherichia coli NOT DETECTED NOT DETECTED Final   Klebsiella oxytoca NOT DETECTED NOT DETECTED Final   Klebsiella pneumoniae NOT DETECTED NOT DETECTED Final   Proteus species NOT DETECTED NOT DETECTED Final   Serratia marcescens NOT DETECTED NOT DETECTED Final   Haemophilus influenzae NOT DETECTED NOT DETECTED Final   Neisseria meningitidis NOT DETECTED NOT DETECTED Final   Pseudomonas aeruginosa NOT DETECTED NOT DETECTED Final   Candida albicans NOT DETECTED NOT DETECTED Final   Candida glabrata NOT DETECTED NOT DETECTED Final   Candida krusei NOT DETECTED NOT DETECTED Final   Candida parapsilosis NOT DETECTED NOT DETECTED Final   Candida tropicalis NOT DETECTED NOT DETECTED Final  MRSA PCR Screening     Status: None   Collection Time: 11/08/16  5:49 AM  Result Value Ref Range Status   MRSA by PCR NEGATIVE NEGATIVE Final    Comment:        The GeneXpert MRSA Assay (FDA approved for NASAL specimens only), is one component of a comprehensive MRSA colonization surveillance program. It is  not intended to diagnose MRSA infection nor to guide or monitor treatment for MRSA infections.   Aerobic Culture (superficial specimen)     Status: None (Preliminary result)   Collection Time: 11/09/16  7:05 PM  Result Value Ref Range Status   Specimen Description WOUND LEFT ARM  Final   Special Requests   Final    SWAB OF FLUID FROM STENT GRAFT LUMEN PT ON ANCEF, VANC   Gram Stain   Final    FEW WBC PRESENT,BOTH PMN AND MONONUCLEAR NO ORGANISMS SEEN    Culture PENDING  Incomplete   Report Status PENDING  Incomplete    Studies/Results: Dg Chest Port 1 View  Result Date: 11/09/2016 CLINICAL DATA:  Central line placement EXAM: PORTABLE CHEST 1 VIEW COMPARISON:  11/08/2016 FINDINGS: Left-sided central venous catheter tip overlies the distal SVC. No left pneumothorax. Mild cardiomegaly with central vascular congestion. No focal consolidation IMPRESSION: Left-sided central venous catheter tip overlies the distal SVC. No left pneumothorax. Cardiomegaly with mild central vascular congestion Electronically Signed   By: Donavan Foil M.D.   On: 11/09/2016 20:01   Dg Chest Port 1 View  Result Date: 11/09/2016 CLINICAL DATA:  Preoperative evaluation for stents in the left upper arm AV graft. EXAM: PORTABLE CHEST 1 VIEW COMPARISON:  11/08/2016 FINDINGS: Shallow inspiration. Heart size and pulmonary vascularity are normal. No focal airspace disease or consolidation in the lungs. No blunting of costophrenic angles. No pneumothorax. Stomach is mildly prominent with gas and shadows likely representing ingested material. Vascular graft is noted in the left upper arm. IMPRESSION: Shallow inspiration.  No evidence of active pulmonary disease. Electronically Signed   By: Lucienne Capers M.D.   On: 11/09/2016 00:40   Dg Humerus Left  Result Date: 11/09/2016 CLINICAL DATA:  Preoperative evaluation of stents in the left upper arm AV graft. End-stage renal disease. EXAM: LEFT HUMERUS - 2+ VIEW COMPARISON:   None. FINDINGS: Vascular grafts demonstrated in the left upper arm. Diffuse vascular calcifications  are present. Surgical clips in the antecubital fossa. No soft tissue gas collections demonstrated. Left humerus appears intact. No focal bone lesion or bone erosion. IMPRESSION: Postoperative changes demonstrated in the left upper arm with vascular grafts present. Diffuse vascular calcifications. Electronically Signed   By: Lucienne Capers M.D.   On: 11/09/2016 00:42     Assessment/Plan: Excision of L UVF 4-16 L IJ HD line 4-16 R IJ thrombosis  Staph/MSSA bacteremia Will continue Ancef Await repeat BCx  ESRD For HD today  Hep C Treated 2017 RNA negative last 2 draws  Total days of antibiotics: [facility] (pager) [phone] [email or web address] 11/10/2016, 12:07 PM  LOS: 2 days

## 2016-11-10 NOTE — Progress Notes (Signed)
Pt left for HD by transport on monitor.

## 2016-11-10 NOTE — Care Management Note (Signed)
Case Management Note  Patient Details  Name: KHAMAURI BAUERNFEIND MRN: [id number] Date of Birth: 1951-05-07  Subjective/Objective:  Pt admitted with fever, malaise and confusion                   Action/Plan:   PTA from home with wife , active with [facility] for HHPT.  Will need resumption orders prior to discharge- CM will request via physician sticky tab   Expected Discharge Date:  11/10/16               Expected Discharge Plan:     In-House Referral:     Discharge planning Services  CM Consult  Post Acute Care Choice:    Choice offered to:     DME Arranged:    DME Agency:     HH Arranged:    [place] Agency:     Status of Service:  In process, will continue to follow  If discussed at Long Length of Stay Meetings, dates discussed:    Additional Comments:  Maryclare Labrador, RN 11/10/2016, 10:20 AM

## 2016-11-10 NOTE — Progress Notes (Signed)
[facility]  Subjective  - hand feels ok, a little swollen   Objective 134/78 73 98.7 F (37.1 C) (Oral) 16 100%  Intake/Output Summary (Last 24 hours) at 11/10/16 1126 Last data filed at 11/10/16 0119  Gross per 24 hour  Intake              860 ml  Output              200 ml  Net              660 ml   Left arm incision clean no hematoma left hand warm Left IJ cath no bleeding  Xray left IJ cath SVC  Assessment/Planning: s/p left AVF fistula/stent removal Will need to convert his catheter to tunneled catheter when appropriate with infection controlled Dr Jonnie Finner preferred temp cath yesterday due to positive blood cultures  Ruta Hinds 11/10/2016 11:26 AM --  Laboratory Lab Results:  Recent Labs  11/09/16 0316 11/10/16 0247  WBC 5.2 4.9  HGB 10.9* 10.8*  HCT 33.8* 33.5*  PLT 152 155   BMET  Recent Labs  11/09/16 0316 11/10/16 0247  NA 136 134*  K 4.5 3.7  CL 101 100*  CO2 23 24  GLUCOSE 111* 118*  BUN 43* 48*  CREATININE 9.36* 10.60*  CALCIUM 8.3* 8.2*    COAG Lab Results  Component Value Date   INR 1.81 11/10/2016   INR 1.62 11/09/2016   INR 2.16 11/09/2016   No results found for: PTT

## 2016-11-10 NOTE — Progress Notes (Signed)
Received from PACU to room 4N05. Vital signs stable. Patient drowsy but oriented to name and place. Catheter to neck with small amount of bleeding noted,. Reinforced dressing. Dressing to left arm dry and intact. Left hand grip weaker than right. Denies request for pain medication.                      Shaguana Love RN

## 2016-11-10 NOTE — Progress Notes (Signed)
Subjective:  For hd today / min. Pain in Postop L arm   Objective Vital signs in last 24 hours: Vitals:   11/10/16 0500 11/10/16 0600 11/10/16 0700 11/10/16 0800  BP: 131/82 (!) 148/97 (!) 146/81 134/78  Pulse: 76 80 76 73  Resp: 16 18 15 16   Temp: 98.7 F (37.1 C)     TempSrc: Oral     SpO2: 100% 98% 100% 100%  Weight:      Height:       Weight change:   Physical Exam: General: alert  NAD , OX3 .  Lungs: CTA  nonlabored breathing  Heart:RRR no mur, or rub .  Abdomen: soft NT + BS Lower extremities: Bilateral BKA with no edema Dialysis Access: left upper AVF clean dry surgical site / L IJ Temp  HD cath   Dialysis Orders:  TTS [facility] 4 hr EDW 83 2 K 2.5 Ca profile 4 heparin 2800 left upper AVF Hectorol 2 no ESA or Fe Recent labs: hgb 14.2 4/12 38% sat Ca/P ok iPTH 362 Recent antibiotics: 1.75 gm Lucianne Lei 4/14 and 2 gm Tressie Ellis 4/14 prior to admission  Problem/Plan 1. Sepsis -Staph aureus bacteremia 4/15 admit BC and   BC + X2 =Grm Pos. [facility] in Clusters [facility] 11[facility]- likely secondary to infected AVF/stent sp removed yest. - VVS / ID following.Antib per ID  2. ESRD- TTS -K =3.7  4 k bath today reck labs pre hd HD using temp dialysis cath for now 3. Hypertension/volume- BP now at  Baseline/ Was  hypotensive upon admission and received 1 L of fluid - CXR clear upon admission - essentially kept even yesterday at dialysis - came in at EDW - last wt 4/15= 2kg over with BED WT (Bilat BKAS) had left a little below edw Monday and Wed at  OP unit .- may need lowering  at d/c/ eating well now attempt 2l uf  4. Anemia- hgb 12.9 >10.9 >10.8 follow  Trend.- off ESA last dose was 75 on 4/3 -  5. Metabolic bone disease-  phos 2.5>1.9   Ca. Corrected 9.9 on hectorol/ binders/sensipar/ will dc  calcium acetate with low phos / fu trend   6. DM - per primary 7. Nutrition-renal carb mod diet - vits/nepro 8. Hx afib/aflutter- NSR now - on amiodarone and chronic coumadin - /per  admit  9. Bipolar / depression - per admit  restart home seroquel  Ernest Haber, PA-C [place] [phone] 11/10/2016,11:46 AM  LOS: 2 days   Pt seen, examined, agree w assess/plan as above with additions as indicated.  Kelly Splinter MD [facility] pager [phone]    cell [phone] 11/10/2016, 4:12 PM     Labs: Basic Metabolic Panel:  Recent Labs Lab 11/08/16 0000 11/09/16 0316 11/10/16 0247  NA 135 136 134*  K 4.0 4.5 3.7  CL 93* 101 100*  CO2 26 23 24   GLUCOSE 201* 111* 118*  BUN 32* 43* 48*  CREATININE 8.26* 9.36* 10.60*  CALCIUM 8.9 8.3* 8.2*  PHOS  --  2.5 1.9*   Liver Function Tests:  Recent Labs Lab 11/08/16 0000 11/09/16 0316 11/10/16 0247  AST 17  --   --   ALT 12*  --   --   ALKPHOS 44  --   --   BILITOT 0.8  --   --   PROT 7.4  --   --   ALBUMIN 3.3* 2.7* 2.6*  CBC:  Recent Labs Lab  11/08/16 0000 11/09/16 0316 11/10/16 0247  WBC 6.1 5.2 4.9  NEUTROABS 4.7  --   --   HGB 12.9* 10.9* 10.8*  HCT 40.0 33.8* 33.5*  MCV 93.0 91.1 90.1  PLT 147* 152 155   Cardiac Enzymes:  Recent Labs Lab 11/08/16 0753  TROPONINI 0.03*   CBG:  Recent Labs Lab 11/09/16 1251 11/09/16 1552 11/09/16 2006 11/10/16 0040 11/10/16 0856  GLUCAP 92 97 83 125* 84    Studies/Results: Dg Chest Port 1 View  Result Date: 11/09/2016 CLINICAL DATA:  Central line placement EXAM: PORTABLE CHEST 1 VIEW COMPARISON:  11/08/2016 FINDINGS: Left-sided central venous catheter tip overlies the distal SVC. No left pneumothorax. Mild cardiomegaly with central vascular congestion. No focal consolidation IMPRESSION: Left-sided central venous catheter tip overlies the distal SVC. No left pneumothorax. Cardiomegaly with mild central vascular congestion Electronically Signed   By: Donavan Foil M.D.   On: 11/09/2016 20:01   Dg Chest Port 1 View  Result Date: 11/09/2016 CLINICAL DATA:  Preoperative evaluation for stents in the left upper  arm AV graft. EXAM: PORTABLE CHEST 1 VIEW COMPARISON:  11/08/2016 FINDINGS: Shallow inspiration. Heart size and pulmonary vascularity are normal. No focal airspace disease or consolidation in the lungs. No blunting of costophrenic angles. No pneumothorax. Stomach is mildly prominent with gas and shadows likely representing ingested material. Vascular graft is noted in the left upper arm. IMPRESSION: Shallow inspiration.  No evidence of active pulmonary disease. Electronically Signed   By: Lucienne Capers M.D.   On: 11/09/2016 00:40   Dg Humerus Left  Result Date: 11/09/2016 CLINICAL DATA:  Preoperative evaluation of stents in the left upper arm AV graft. End-stage renal disease. EXAM: LEFT HUMERUS - 2+ VIEW COMPARISON:  None. FINDINGS: Vascular grafts demonstrated in the left upper arm. Diffuse vascular calcifications are present. Surgical clips in the antecubital fossa. No soft tissue gas collections demonstrated. Left humerus appears intact. No focal bone lesion or bone erosion. IMPRESSION: Postoperative changes demonstrated in the left upper arm with vascular grafts present. Diffuse vascular calcifications. Electronically Signed   By: Lucienne Capers M.D.   On: 11/09/2016 00:42   Medications: . sodium chloride 10 mL/hr at 11/09/16 1622  .  ceFAZolin (ANCEF) IV     . amiodarone  200 mg Oral Daily  . atorvastatin  40 mg Oral q1800  . cinacalcet  30 mg Oral QPM  . doxercalciferol  2 mcg Intravenous Q T,Th,Sa-HD  . feeding supplement (NEPRO CARB STEADY)  237 mL Oral BID BM  . insulin aspart  0-5 Units Subcutaneous QHS  . insulin aspart  0-9 Units Subcutaneous TID WC  . multivitamin  1 tablet Oral QHS  . pantoprazole  40 mg Oral BID AC  . QUEtiapine  25 mg Oral BID  . sodium chloride flush  3 mL Intravenous Q12H

## 2016-11-11 LAB — ECHOCARDIOGRAM COMPLETE
FS: 25 % — AB (ref 28–44)
HEIGHTINCHES: 70 in
IVS/LV PW RATIO, ED: 0.99
LA ID, A-P, ES: 31 mm
LA diam index: 1.53 cm/m2
LA vol A4C: 29.6 ml
LA vol index: 18.1 mL/m2
LAVOL: 36.7 mL
LEFT ATRIUM END SYS DIAM: 31 mm
LV e' LATERAL: 3.24 cm/s
PW: 9.15 mm — AB (ref 0.6–1.1)
Reg peak vel: 277 cm/s
TDI e' lateral: 3.24
TDI e' medial: 4.68
TR max vel: 277 cm/s
WEIGHTICAEL: 2998.4 [oz_av]

## 2016-11-11 LAB — GLUCOSE, CAPILLARY
GLUCOSE-CAPILLARY: 113 mg/dL — AB (ref 65–99)
Glucose-Capillary: 136 mg/dL — ABNORMAL HIGH (ref 65–99)
Glucose-Capillary: 159 mg/dL — ABNORMAL HIGH (ref 65–99)
Glucose-Capillary: 129 mg/dL — ABNORMAL HIGH (ref 65–99)

## 2016-11-11 LAB — RENAL FUNCTION PANEL
ANION GAP: 10 (ref 5–15)
Albumin: 2.6 g/dL — ABNORMAL LOW (ref 3.5–5.0)
BUN: 25 mg/dL — AB (ref 6–20)
CHLORIDE: 98 mmol/L — AB (ref 101–111)
CO2: 26 mmol/L (ref 22–32)
Calcium: 7.9 mg/dL — ABNORMAL LOW (ref 8.9–10.3)
Creatinine, Ser: 7.61 mg/dL — ABNORMAL HIGH (ref 0.61–1.24)
GFR calc Af Amer: 8 mL/min — ABNORMAL LOW (ref >60)
GFR, EST NON AFRICAN AMERICAN: 7 mL/min — AB (ref >60)
Glucose, Bld: 101 mg/dL — ABNORMAL HIGH (ref 65–99)
Phosphorus: 1.9 mg/dL — ABNORMAL LOW (ref 2.5–4.6)
Potassium: 3.8 mmol/L (ref 3.5–5.1)
Sodium: 134 mmol/L — ABNORMAL LOW (ref 135–145)

## 2016-11-11 LAB — CBC
HCT: 32.2 % — ABNORMAL LOW (ref 39.0–52.0)
HEMOGLOBIN: 10.4 g/dL — AB (ref 13.0–17.0)
MCH: 29 pg (ref 26.0–34.0)
MCHC: 32.3 g/dL (ref 30.0–36.0)
MCV: 89.7 fL (ref 78.0–100.0)
Platelets: 172 10*3/uL (ref 150–400)
RBC: 3.59 MIL/uL — ABNORMAL LOW (ref 4.22–5.81)
RDW: 18.7 % — AB (ref 11.5–15.5)
WBC: 3.9 10*3/uL — AB (ref 4.0–10.5)

## 2016-11-11 LAB — PROTIME-INR
INR: 1.75
PROTHROMBIN TIME: 20.7 s — AB (ref 11.4–15.2)

## 2016-11-11 NOTE — Care Management Note (Signed)
Case Management Note  Patient Details  Name: Oscar Castillo MRN: [id number] Date of Birth: 1950/10/23  Subjective/Objective:                 Patient from home with wife. Was using [facility] for [facility] PT prior to admission. Patient admitted with sepsis, positive for MSSA bacteremia, has fistula removed and tunneled cath placed for temporary HD. ID following for recs for Abx, and need for IV Abx after DC.    Action/Plan:   Expected Discharge Date:  11/10/16               Expected Discharge Plan:  [place]  In-House Referral:     Discharge planning Services  CM Consult  Post Acute Care Choice:    Choice offered to:     DME Arranged:    DME Agency:     HH Arranged:    HH Agency:     Status of Service:  In process, will continue to follow  If discussed at Long Length of Stay Meetings, dates discussed:    Additional Comments:  Carles Collet, RN 11/11/2016, 12:04 PM

## 2016-11-11 NOTE — Progress Notes (Signed)
Subjective:  No complaints, feeling better  Objective Vital signs in last 24 hours: Vitals:   11/10/16 1850 11/10/16 2040 11/11/16 0359 11/11/16 0941  BP: (!) 140/94 (!) 158/96 123/80 120/82  Pulse: 77 81 74 79  Resp:  20 18 18   Temp:  98.7 F (37.1 C) 98.5 F (36.9 C) 98.2 F (36.8 C)  TempSrc:    Oral  SpO2:  100% 99% 100%  Weight:      Height:       Weight change:   Physical Exam: General: alert  NAD , OX3 .  Lungs: CTA  nonlabored breathing  Heart:RRR no mur, or rub .  Abdomen: soft NT + BS Lower extremities: Bilateral BKA with no edema Dialysis Access: left upper AVF clean dry surgical site / L IJ Temp  HD cath   [facility]   83kg   2/2.5 bath  P4   Hep 2800  LUA AVF - Hect 2 ug - no esa/ Fe - last hgb 14.2 4/12 38% sat Ca/P ok iPTH 362 - recent antibiotics: 1.75 gm Lucianne Lei 4/14 and 2 gm Tressie Ellis 4/14 prior to admission  Assessment: 1. MSSA bacteremia / infected AVF and stent - sp AVF/ stent removal 4/16. Blood cx from 4/17 is +still, will order another culture today.  Ideally should not put tunneled cath in until BCx's neg. ID following.   2. ESRD- TTS using temp cath for now 3. Volume- up 6kg by wts and looks full on exam 4. Anemia- hgb 12.9 >10.9 >10.8 follow  Trend.- off ESA last dose was 75 on 4/3 -  5. Metabolic bone disease-  phos 2.5>1.9   Ca. Corrected 9.9 on hectorol/ binders/sensipar/ will dc  calcium acetate with low phos / fu trend   6. DM - per primary 7. Nutrition-renal carb mod diet - vits/nepro 8. Hx afib/aflutter- NSR now - on amiodarone and chronic coumadin - /per admit  9. Bipolar / depression - per admit  restart home seroquel    Plan - HD tomorrow, max UF as tolerated, repeating blood cx's today  Kelly Splinter MD [employer] pgr [phone]   11/11/2016, 11:35 AM    Labs: Basic Metabolic Panel:  Recent Labs Lab 11/09/16 0316 11/10/16 0247 11/11/16 0440  NA 136 134* 134*  K 4.5 3.7 3.8  CL  101 100* 98*  CO2 23 24 26   GLUCOSE 111* 118* 101*  BUN 43* 48* 25*  CREATININE 9.36* 10.60* 7.61*  CALCIUM 8.3* 8.2* 7.9*  PHOS 2.5 1.9* 1.9*   Liver Function Tests:  Recent Labs Lab 11/08/16 0000 11/09/16 0316 11/10/16 0247 11/11/16 0440  AST 17  --   --   --   ALT 12*  --   --   --   ALKPHOS 44  --   --   --   BILITOT 0.8  --   --   --   PROT 7.4  --   --   --   ALBUMIN 3.3* 2.7* 2.6* 2.6*  CBC:  Recent Labs Lab 11/08/16 0000 11/09/16 0316 11/10/16 0247 11/11/16 0440  WBC 6.1 5.2 4.9 3.9*  NEUTROABS 4.7  --   --   --   HGB 12.9* 10.9* 10.8* 10.4*  HCT 40.0 33.8* 33.5* 32.2*  MCV 93.0 91.1 90.1 89.7  PLT 147* 152 155 172   Cardiac Enzymes:  Recent Labs Lab 11/08/16 0753  TROPONINI 0.03*   CBG:  Recent Labs Lab 11/10/16 0856 11/10/16 1148 11/10/16  1839 11/10/16 2154 11/11/16 0358  GLUCAP 84 133* 157* 138* 113*    Studies/Results: Dg Chest Port 1 View  Result Date: 11/09/2016 CLINICAL DATA:  Central line placement EXAM: PORTABLE CHEST 1 VIEW COMPARISON:  11/08/2016 FINDINGS: Left-sided central venous catheter tip overlies the distal SVC. No left pneumothorax. Mild cardiomegaly with central vascular congestion. No focal consolidation IMPRESSION: Left-sided central venous catheter tip overlies the distal SVC. No left pneumothorax. Cardiomegaly with mild central vascular congestion Electronically Signed   By: Donavan Foil M.D.   On: 11/09/2016 20:01   Medications: . sodium chloride 10 mL/hr at 11/09/16 1622  .  ceFAZolin (ANCEF) IV     . amiodarone  200 mg Oral Daily  . atorvastatin  40 mg Oral q1800  . cinacalcet  30 mg Oral QPM  . doxercalciferol  2 mcg Intravenous Q T,Th,Sa-HD  . feeding supplement (NEPRO CARB STEADY)  237 mL Oral BID BM  . insulin aspart  0-5 Units Subcutaneous QHS  . insulin aspart  0-9 Units Subcutaneous TID WC  . multivitamin  1 tablet Oral QHS  . pantoprazole  40 mg Oral BID AC  . QUEtiapine  25 mg Oral BID  . sodium  chloride flush  3 mL Intravenous Q12H

## 2016-11-11 NOTE — Progress Notes (Signed)
error 

## 2016-11-11 NOTE — Progress Notes (Addendum)
INFECTIOUS DISEASE PROGRESS NOTE  ID: Oscar Castillo is a 66 y.o. male with  Principal Problem:   Sepsis ([place]) Active Problems:   Hypertensive heart disease with CHF (congestive heart failure) ([place])   Type 2 diabetes mellitus with chronic kidney disease on chronic dialysis, with long-term current use of insulin (H[facility])   Chronic combined systolic and diastolic congestive heart failure   ESRD on hemodialysis ([facility])   S/P BKA (below knee amputation) bilateral ([facility])   Paroxysmal atrial fibrillation ([facility])   Anemia due to stage 5 chronic kidney disease ([place])  Subjective: Without complaints.   Abtx:  Anti-infectives    Start     Dose/Rate Route Frequency Ordered Stop   11/10/16 1200  ceFAZolin (ANCEF) IVPB 2g/100 mL premix     2 g 200 mL/hr over 30 Minutes Intravenous Every T-Th-Sa (Hemodialysis) 11/08/16 1824     11/09/16 1200  vancomycin (VANCOCIN) IVPB 750 mg/150 ml premix  Status:  Discontinued     750 mg 150 mL/hr over 60 Minutes Intravenous Every M-W-F (Hemodialysis) 11/08/16 0421 11/08/16 1758   11/09/16 0000  vancomycin (VANCOCIN) IVPB 1000 mg/200 mL premix  Status:  Discontinued     1,000 mg 200 mL/hr over 60 Minutes Intravenous To Surgery 11/08/16 1949 11/08/16 1959   11/08/16 1915  ceFAZolin (ANCEF) IVPB 2g/100 mL premix     2 g 200 mL/hr over 30 Minutes Intravenous  Once 11/08/16 1824 11/08/16 2055   11/08/16 1000  piperacillin-tazobactam (ZOSYN) IVPB 3.375 g  Status:  Discontinued     3.375 g 12.5 mL/hr over 240 Minutes Intravenous Every 12 hours 11/08/16 0421 11/08/16 1758   11/08/16 0500  vancomycin (VANCOCIN) 500 mg in sodium chloride 0.9 % 100 mL IVPB     500 mg 100 mL/hr over 60 Minutes Intravenous  Once 11/08/16 0421 11/08/16 0637   11/08/16 0030  piperacillin-tazobactam (ZOSYN) IVPB 3.375 g     3.375 g 100 mL/hr over 30 Minutes Intravenous  Once 11/08/16 0017 11/08/16 0109   11/08/16 0030  vancomycin (VANCOCIN) 1,500 mg in sodium chloride 0.9 % 500 mL IVPB   Status:  Discontinued     1,500 mg 250 mL/hr over 120 Minutes Intravenous  Once 11/08/16 0018 11/08/16 0027   11/08/16 0030  vancomycin (VANCOCIN) IVPB 1000 mg/200 mL premix     1,000 mg 200 mL/hr over 60 Minutes Intravenous  Once 11/08/16 0028 11/08/16 0142   11/08/16 0015  vancomycin (VANCOCIN) IVPB 1000 mg/200 mL premix  Status:  Discontinued     1,000 mg 200 mL/hr over 60 Minutes Intravenous  Once 11/08/16 0014 11/08/16 0017      Medications:  Scheduled: . amiodarone  200 mg Oral Daily  . atorvastatin  40 mg Oral q1800  . cinacalcet  30 mg Oral QPM  . doxercalciferol  2 mcg Intravenous Q T,Th,Sa-HD  . feeding supplement (NEPRO CARB STEADY)  237 mL Oral BID BM  . insulin aspart  0-5 Units Subcutaneous QHS  . insulin aspart  0-9 Units Subcutaneous TID WC  . multivitamin  1 tablet Oral QHS  . pantoprazole  40 mg Oral BID AC  . QUEtiapine  25 mg Oral BID  . sodium chloride flush  3 mL Intravenous Q12H    Objective: Vital signs in last 24 hours: Temp:  [98.2 F (36.8 C)-98.7 F (37.1 C)] 98.2 F (36.8 C) (04/18 0941) Pulse Rate:  [74-81] 79 (04/18 0941) Resp:  [17-20] 18 (04/18 0941) BP: (120-158)/(65-96) 120/82 (04/18 0941) SpO2:  [  98 %-100 %] 100 % (04/18 0941) Weight:  [88 kg (194 lb 0.1 oz)] 88 kg (194 lb 0.1 oz) (04/17 1730)   General appearance: alert, cooperative and no distress Neck: L neck HD line.  Resp: clear to auscultation bilaterally Cardio: regular rate and rhythm GI: normal findings: bowel sounds normal and soft, non-tender Extremities: LUE wound clean. closed.   Lab Results  Recent Labs  11/10/16 0247 11/11/16 0440  WBC 4.9 3.9*  HGB 10.8* 10.4*  HCT 33.5* 32.2*  NA 134* 134*  K 3.7 3.8  CL 100* 98*  CO2 24 26  BUN 48* 25*  CREATININE 10.60* 7.61*   Liver Panel  Recent Labs  11/10/16 0247 11/11/16 0440  ALBUMIN 2.6* 2.6*   Sedimentation Rate No results for input(s): ESRSEDRATE in the last 72 hours. C-Reactive Protein No results  for input(s): CRP in the last 72 hours.  Microbiology: Recent Results (from the past 240 hour(s))  Culture, blood (Routine x 2)     Status: None (Preliminary result)   Collection Time: 11/08/16 12:00 AM  Result Value Ref Range Status   Specimen Description BLOOD RIGHT ARM  Final   Special Requests   Final    BOTTLES DRAWN AEROBIC AND ANAEROBIC Blood Culture adequate volume   Culture  Setup Time   Final    GRAM POSITIVE COCCI IN CLUSTERS ANAEROBIC BOTTLE ONLY CRITICAL VALUE NOTED.  VALUE IS CONSISTENT WITH PREVIOUSLY REPORTED AND CALLED VALUE.    Culture NO GROWTH 3 DAYS  Final   Report Status PENDING  Incomplete  Culture, blood (Routine x 2)     Status: Abnormal   Collection Time: 11/08/16 12:12 AM  Result Value Ref Range Status   Specimen Description BLOOD RIGHT HAND  Final   Special Requests IN PEDIATRIC BOTTLE Blood Culture adequate volume  Final   Culture  Setup Time   Final    GRAM POSITIVE COCCI IN CLUSTERS IN PEDIATRIC BOTTLE CRITICAL RESULT CALLED TO, READ BACK BY AND VERIFIED WITH: T STONE, PHARM, 11/08/16 AT 1616 BY J FUDESCO    Culture STAPHYLOCOCCUS AUREUS (A)  Final   Report Status 11/10/2016 FINAL  Final   Organism ID, Bacteria STAPHYLOCOCCUS AUREUS  Final      Susceptibility   Staphylococcus aureus - MIC*    CIPROFLOXACIN <=0.5 SENSITIVE Sensitive     ERYTHROMYCIN >=8 RESISTANT Resistant     GENTAMICIN <=0.5 SENSITIVE Sensitive     OXACILLIN 0.5 SENSITIVE Sensitive     TETRACYCLINE <=1 SENSITIVE Sensitive     VANCOMYCIN <=0.5 SENSITIVE Sensitive     TRIMETH/SULFA <=10 SENSITIVE Sensitive     CLINDAMYCIN <=0.25 SENSITIVE Sensitive     RIFAMPIN <=0.5 SENSITIVE Sensitive     Inducible Clindamycin NEGATIVE Sensitive     * STAPHYLOCOCCUS AUREUS  Blood Culture ID Panel (Reflexed)     Status: Abnormal   Collection Time: 11/08/16 12:12 AM  Result Value Ref Range Status   Enterococcus species NOT DETECTED NOT DETECTED Final   Listeria monocytogenes NOT DETECTED NOT  DETECTED Final   Staphylococcus species DETECTED (A) NOT DETECTED Final    Comment: CRITICAL RESULT CALLED TO, READ BACK BY AND VERIFIED WITH: T. STONE, PHARM, 11/08/16 AT 1616 BY J FUDESCO    Staphylococcus aureus DETECTED (A) NOT DETECTED Final    Comment: Methicillin (oxacillin) susceptible Staphylococcus aureus (MSSA). Preferred therapy is anti staphylococcal beta lactam antibiotic (Cefazolin or Nafcillin), unless clinically contraindicated. CRITICAL RESULT CALLED TO, READ BACK BY AND VERIFIED WITH: T. STONE, PHARM,  11/08/16 AT 1616 BY J FUDESCO    Methicillin resistance NOT DETECTED NOT DETECTED Final   Streptococcus species NOT DETECTED NOT DETECTED Final   Streptococcus agalactiae NOT DETECTED NOT DETECTED Final   Streptococcus pneumoniae NOT DETECTED NOT DETECTED Final   Streptococcus pyogenes NOT DETECTED NOT DETECTED Final   Acinetobacter baumannii NOT DETECTED NOT DETECTED Final   Enterobacteriaceae species NOT DETECTED NOT DETECTED Final   Enterobacter cloacae complex NOT DETECTED NOT DETECTED Final   Escherichia coli NOT DETECTED NOT DETECTED Final   Klebsiella oxytoca NOT DETECTED NOT DETECTED Final   Klebsiella pneumoniae NOT DETECTED NOT DETECTED Final   Proteus species NOT DETECTED NOT DETECTED Final   Serratia marcescens NOT DETECTED NOT DETECTED Final   Haemophilus influenzae NOT DETECTED NOT DETECTED Final   Neisseria meningitidis NOT DETECTED NOT DETECTED Final   Pseudomonas aeruginosa NOT DETECTED NOT DETECTED Final   Candida albicans NOT DETECTED NOT DETECTED Final   Candida glabrata NOT DETECTED NOT DETECTED Final   Candida krusei NOT DETECTED NOT DETECTED Final   Candida parapsilosis NOT DETECTED NOT DETECTED Final   Candida tropicalis NOT DETECTED NOT DETECTED Final  MRSA PCR Screening     Status: None   Collection Time: 11/08/16  5:49 AM  Result Value Ref Range Status   MRSA by PCR NEGATIVE NEGATIVE Final    Comment:        The GeneXpert MRSA Assay  (FDA approved for NASAL specimens only), is one component of a comprehensive MRSA colonization surveillance program. It is not intended to diagnose MRSA infection nor to guide or monitor treatment for MRSA infections.   Aerobic Culture (superficial specimen)     Status: None (Preliminary result)   Collection Time: 11/09/16  7:05 PM  Result Value Ref Range Status   Specimen Description WOUND LEFT ARM  Final   Special Requests   Final    SWAB OF FLUID FROM STENT GRAFT LUMEN PT ON ANCEF, VANC   Gram Stain   Final    FEW WBC PRESENT,BOTH PMN AND MONONUCLEAR NO ORGANISMS SEEN    Culture FEW STAPHYLOCOCCUS AUREUS  Final   Report Status PENDING  Incomplete   Organism ID, Bacteria STAPHYLOCOCCUS AUREUS  Final      Susceptibility   Staphylococcus aureus - MIC*    CIPROFLOXACIN <=0.5 SENSITIVE Sensitive     ERYTHROMYCIN >=8 RESISTANT Resistant     GENTAMICIN <=0.5 SENSITIVE Sensitive     OXACILLIN 0.5 SENSITIVE Sensitive     TETRACYCLINE <=1 SENSITIVE Sensitive     VANCOMYCIN <=0.5 SENSITIVE Sensitive     TRIMETH/SULFA <=10 SENSITIVE Sensitive     CLINDAMYCIN <=0.25 SENSITIVE Sensitive     RIFAMPIN <=0.5 SENSITIVE Sensitive     Inducible Clindamycin NEGATIVE Sensitive     * FEW STAPHYLOCOCCUS AUREUS  Culture, blood (Routine X 2) w Reflex to ID Panel     Status: None (Preliminary result)   Collection Time: 11/10/16  8:41 AM  Result Value Ref Range Status   Specimen Description BLOOD RIGHT ARM  Final   Special Requests IN PEDIATRIC BOTTLE Blood Culture adequate volume  Final   Culture  Setup Time   Final    GRAM POSITIVE COCCI IN CLUSTERS IN PEDIATRIC BOTTLE CRITICAL VALUE NOTED.  VALUE IS CONSISTENT WITH PREVIOUSLY REPORTED AND CALLED VALUE.    Culture NO GROWTH 1 DAY  Final   Report Status PENDING  Incomplete    Studies/Results: Dg Chest Port 1 View  Result Date: 11/09/2016 CLINICAL DATA:  Central line placement EXAM: PORTABLE CHEST 1 VIEW COMPARISON:  11/08/2016 FINDINGS:  Left-sided central venous catheter tip overlies the distal SVC. No left pneumothorax. Mild cardiomegaly with central vascular congestion. No focal consolidation IMPRESSION: Left-sided central venous catheter tip overlies the distal SVC. No left pneumothorax. Cardiomegaly with mild central vascular congestion Electronically Signed   By: Donavan Foil M.D.   On: 11/09/2016 20:01     Assessment/Plan: Excision of L UVF 4-16 L IJ HD line 4-16 R IJ thrombosis  Staph/MSSA bacteremia Will continue Ancef His BCx from 4-16 is again positive.  Repeat BCx sent today.  Needs TEE  ESRD Appreciate and agree with Dr Jonnie Finner, no tunneled cath til bacteremia cleared.  Would pull his HD line when possible.   Hep C Treated 2017 RNA negative last 2 draws  Total days of antibiotics: [facility] (pager) [phone] [email or web address] 11/11/2016, 3:20 PM  LOS: 3 days

## 2016-11-11 NOTE — Progress Notes (Addendum)
[facility]  Subjective  - feels ok   Objective 123/80 74 98.5 F (36.9 C) 18 99%  Intake/Output Summary (Last 24 hours) at 11/11/16 0841 Last data filed at 11/11/16 0600  Gross per 24 hour  Intake           815.33 ml  Output            -2699 ml  Net          3514.33 ml   Left upper extremity mild left hand edema but improved 1.5 cm hematoma distal left arm non pulsatile Incision left arm/axilla healing  Assessment/Planning: s/p removal infected left arm stent/fistula Pt will need transition of left IJ cath to tunneled cath prior to d/c Intraop culture growing staph  Ruta Hinds 11/11/2016 8:41 AM --  Laboratory Lab Results:  Recent Labs  11/10/16 0247 11/11/16 0440  WBC 4.9 3.9*  HGB 10.8* 10.4*  HCT 33.5* 32.2*  PLT 155 172   BMET  Recent Labs  11/10/16 0247 11/11/16 0440  NA 134* 134*  K 3.7 3.8  CL 100* 98*  CO2 24 26  GLUCOSE 118* 101*  BUN 48* 25*  CREATININE 10.60* 7.61*  CALCIUM 8.2* 7.9*    COAG Lab Results  Component Value Date   INR 1.75 11/11/2016   INR 1.81 11/10/2016   INR 1.62 11/09/2016   No results found for: PTT

## 2016-11-11 NOTE — Progress Notes (Signed)
[facility]  PROGRESS NOTE  Oscar Castillo:[id number] DOB: 1950/10/05 DOA: 11/07/2016 PCP: [name], MD   Brief HPI:    66 y.o.malewith a past medical history significant for ESRD on HD MWF, CHF EF 50%, Afib on amiodarone and warfarin, NIDDM and bilateral BKAwho presents with fever, malaise.  He was admitted with sepsis thought secondary to infected left arm fistula/stent.  He has staph aureus bacteremia.  ID consulted.  VVS and nephrology following.  Pt to OR 4/16 for removal of stents and placement of temp dialysis catheter.       Subjective   Patient seen, denies any pain.   Assessment/Plan:     1. Sepsis- secondary to infected AVF, he was taken to or on 4/16 for AVF removal and placement of temporary dialysis catheter. He is currently on IV cefazolin per ID. Lactic acid on 11/08/2016 was 1.2. 2. Staph aureus bacteremia- Blood cultures grew methicillin sensitive staph aureus, treated with cefazolin as above. Tunneled catheter will be placed, once bacteremia is cleared. 3. ESRD on dialysis- patient gets dialysis Tuesday Thursday and Saturday. Nephrology following. 4. Diabetes mellitus- blood was controlled, continue sliding scale insulin with NovoLog. 5. Hypertension- blood pressure stable. 6. History of atrial fibrillation/atrial flutter- heart rate is controlled, continue amiodarone. Coumadin is currently on hold, after AVF removal on 11/09/2016. We'll restart Coumadin when okay with vascular surgery. 7. Bipolar disorder/depression-continue Seroquel.     DVT prophylaxis: Warfarin  Code Status: Full code  Family Communication: No family at bedside  Disposition Plan: Home versus skilled nursing facility   Consultants:  Vascular surgery  Nephrology  Infectious disease  Procedures:  4/16 stent removal and temp dialysis catheter placement  Continuous infusions . sodium chloride 10 mL/hr at 11/09/16 1622  .  ceFAZolin (ANCEF) IV         Antibiotics:   Anti-infectives    Start     Dose/Rate Route Frequency Ordered Stop   11/10/16 1200  ceFAZolin (ANCEF) IVPB 2g/100 mL premix     2 g 200 mL/hr over 30 Minutes Intravenous Every T-Th-Sa (Hemodialysis) 11/08/16 1824     11/09/16 1200  vancomycin (VANCOCIN) IVPB 750 mg/150 ml premix  Status:  Discontinued     750 mg 150 mL/hr over 60 Minutes Intravenous Every M-W-F (Hemodialysis) 11/08/16 0421 11/08/16 1758   11/09/16 0000  vancomycin (VANCOCIN) IVPB 1000 mg/200 mL premix  Status:  Discontinued     1,000 mg 200 mL/hr over 60 Minutes Intravenous To Surgery 11/08/16 1949 11/08/16 1959   11/08/16 1915  ceFAZolin (ANCEF) IVPB 2g/100 mL premix     2 g 200 mL/hr over 30 Minutes Intravenous  Once 11/08/16 1824 11/08/16 2055   11/08/16 1000  piperacillin-tazobactam (ZOSYN) IVPB 3.375 g  Status:  Discontinued     3.375 g 12.5 mL/hr over 240 Minutes Intravenous Every 12 hours 11/08/16 0421 11/08/16 1758   11/08/16 0500  vancomycin (VANCOCIN) 500 mg in sodium chloride 0.9 % 100 mL IVPB     500 mg 100 mL/hr over 60 Minutes Intravenous  Once 11/08/16 0421 11/08/16 0637   11/08/16 0030  piperacillin-tazobactam (ZOSYN) IVPB 3.375 g     3.375 g 100 mL/hr over 30 Minutes Intravenous  Once 11/08/16 0017 11/08/16 0109   11/08/16 0030  vancomycin (VANCOCIN) 1,500 mg in sodium chloride 0.9 % 500 mL IVPB  Status:  Discontinued     1,500 mg 250 mL/hr over 120 Minutes Intravenous  Once 11/08/16 0018 11/08/16 0027   11/08/16 0030  vancomycin (VANCOCIN) IVPB 1000 mg/200 mL premix     1,000 mg 200 mL/hr over 60 Minutes Intravenous  Once 11/08/16 0028 11/08/16 0142   11/08/16 0015  vancomycin (VANCOCIN) IVPB 1000 mg/200 mL premix  Status:  Discontinued     1,000 mg 200 mL/hr over 60 Minutes Intravenous  Once 11/08/16 0014 11/08/16 0017       Objective   Vitals:   11/10/16 1850 11/10/16 2040 11/11/16 0359 11/11/16 0941  BP: (!) 140/94 (!) 158/96 123/80 120/82  Pulse: 77 81 74 79   Resp:  20 18 18   Temp:  98.7 F (37.1 C) 98.5 F (36.9 C) 98.2 F (36.8 C)  TempSrc:    Oral  SpO2:  100% 99% 100%  Weight:      Height:        Intake/Output Summary (Last 24 hours) at 11/11/16 1458 Last data filed at 11/11/16 0900  Gross per 24 hour  Intake           815.33 ml  Output            -2699 ml  Net          3514.33 ml   Filed Weights   11/08/16 0537 11/10/16 1341 11/10/16 1730  Weight: 85 kg (187 lb 6.4 oz) 91 kg (200 lb 9.9 oz) 88 kg (194 lb 0.1 oz)     Physical Examination:  General exam: Appears calm and comfortable. Respiratory system: Clear to auscultation. Respiratory effort normal. Cardiovascular system:  RRR. No  murmurs, rubs, gallops. No pedal edema. GI system: Abdomen is nondistended, soft and nontender. No organomegaly.  Central nervous system. No focal neurological deficits. 5 x 5 power in all extremities. Skin: Left upper extremity wound is clean Psychiatry: Alert, oriented x 3.Judgement and insight appear normal. Affect normal.    Data Reviewed: I have personally reviewed following labs and imaging studies  CBG:  Recent Labs Lab 11/10/16 1148 11/10/16 1839 11/10/16 2154 11/11/16 0358 11/11/16 1213  GLUCAP 133* 157* 138* 113* 136*    CBC:  Recent Labs Lab 11/08/16 0000 11/09/16 0316 11/10/16 0247 11/11/16 0440  WBC 6.1 5.2 4.9 3.9*  NEUTROABS 4.7  --   --   --   HGB 12.9* 10.9* 10.8* 10.4*  HCT 40.0 33.8* 33.5* 32.2*  MCV 93.0 91.1 90.1 89.7  PLT 147* 152 155 811    Basic Metabolic Panel:  Recent Labs Lab 11/08/16 0000 11/09/16 0316 11/10/16 0247 11/11/16 0440  NA 135 136 134* 134*  K 4.0 4.5 3.7 3.8  CL 93* 101 100* 98*  CO2 26 23 24 26   GLUCOSE 201* 111* 118* 101*  BUN 32* 43* 48* 25*  CREATININE 8.26* 9.36* 10.60* 7.61*  CALCIUM 8.9 8.3* 8.2* 7.9*  PHOS  --  2.5 1.9* 1.9*    Recent Results (from the past 240 hour(s))  Culture, blood (Routine x 2)     Status: None (Preliminary result)   Collection  Time: 11/08/16 12:00 AM  Result Value Ref Range Status   Specimen Description BLOOD RIGHT ARM  Final   Special Requests   Final    BOTTLES DRAWN AEROBIC AND ANAEROBIC Blood Culture adequate volume   Culture  Setup Time   Final    GRAM POSITIVE COCCI IN CLUSTERS ANAEROBIC BOTTLE ONLY CRITICAL VALUE NOTED.  VALUE IS CONSISTENT WITH PREVIOUSLY REPORTED AND CALLED VALUE.    Culture NO GROWTH 2 DAYS  Final   Report Status PENDING  Incomplete  Culture, blood (Routine  x 2)     Status: Abnormal   Collection Time: 11/08/16 12:12 AM  Result Value Ref Range Status   Specimen Description BLOOD RIGHT HAND  Final   Special Requests IN PEDIATRIC BOTTLE Blood Culture adequate volume  Final   Culture  Setup Time   Final    GRAM POSITIVE COCCI IN CLUSTERS IN PEDIATRIC BOTTLE CRITICAL RESULT CALLED TO, READ BACK BY AND VERIFIED WITH: T STONE, PHARM, 11/08/16 AT 1616 BY J FUDESCO    Culture STAPHYLOCOCCUS AUREUS (A)  Final   Report Status 11/10/2016 FINAL  Final   Organism ID, Bacteria STAPHYLOCOCCUS AUREUS  Final      Susceptibility   Staphylococcus aureus - MIC*    CIPROFLOXACIN <=0.5 SENSITIVE Sensitive     ERYTHROMYCIN >=8 RESISTANT Resistant     GENTAMICIN <=0.5 SENSITIVE Sensitive     OXACILLIN 0.5 SENSITIVE Sensitive     TETRACYCLINE <=1 SENSITIVE Sensitive     VANCOMYCIN <=0.5 SENSITIVE Sensitive     TRIMETH/SULFA <=10 SENSITIVE Sensitive     CLINDAMYCIN <=0.25 SENSITIVE Sensitive     RIFAMPIN <=0.5 SENSITIVE Sensitive     Inducible Clindamycin NEGATIVE Sensitive     * STAPHYLOCOCCUS AUREUS  Blood Culture ID Panel (Reflexed)     Status: Abnormal   Collection Time: 11/08/16 12:12 AM  Result Value Ref Range Status   Enterococcus species NOT DETECTED NOT DETECTED Final   Listeria monocytogenes NOT DETECTED NOT DETECTED Final   Staphylococcus species DETECTED (A) NOT DETECTED Final    Comment: CRITICAL RESULT CALLED TO, READ BACK BY AND VERIFIED WITH: T. STONE, PHARM, 11/08/16 AT 1616 BY J  FUDESCO    Staphylococcus aureus DETECTED (A) NOT DETECTED Final    Comment: Methicillin (oxacillin) susceptible Staphylococcus aureus (MSSA). Preferred therapy is anti staphylococcal beta lactam antibiotic (Cefazolin or Nafcillin), unless clinically contraindicated. CRITICAL RESULT CALLED TO, READ BACK BY AND VERIFIED WITH: T. STONE, PHARM, 11/08/16 AT 1616 BY J FUDESCO    Methicillin resistance NOT DETECTED NOT DETECTED Final   Streptococcus species NOT DETECTED NOT DETECTED Final   Streptococcus agalactiae NOT DETECTED NOT DETECTED Final   Streptococcus pneumoniae NOT DETECTED NOT DETECTED Final   Streptococcus pyogenes NOT DETECTED NOT DETECTED Final   Acinetobacter baumannii NOT DETECTED NOT DETECTED Final   Enterobacteriaceae species NOT DETECTED NOT DETECTED Final   Enterobacter cloacae complex NOT DETECTED NOT DETECTED Final   Escherichia coli NOT DETECTED NOT DETECTED Final   Klebsiella oxytoca NOT DETECTED NOT DETECTED Final   Klebsiella pneumoniae NOT DETECTED NOT DETECTED Final   Proteus species NOT DETECTED NOT DETECTED Final   Serratia marcescens NOT DETECTED NOT DETECTED Final   Haemophilus influenzae NOT DETECTED NOT DETECTED Final   Neisseria meningitidis NOT DETECTED NOT DETECTED Final   Pseudomonas aeruginosa NOT DETECTED NOT DETECTED Final   Candida albicans NOT DETECTED NOT DETECTED Final   Candida glabrata NOT DETECTED NOT DETECTED Final   Candida krusei NOT DETECTED NOT DETECTED Final   Candida parapsilosis NOT DETECTED NOT DETECTED Final   Candida tropicalis NOT DETECTED NOT DETECTED Final  MRSA PCR Screening     Status: None   Collection Time: 11/08/16  5:49 AM  Result Value Ref Range Status   MRSA by PCR NEGATIVE NEGATIVE Final    Comment:        The GeneXpert MRSA Assay (FDA approved for NASAL specimens only), is one component of a comprehensive MRSA colonization surveillance program. It is not intended to diagnose MRSA infection nor to guide  or monitor treatment for MRSA infections.   Aerobic Culture (superficial specimen)     Status: None (Preliminary result)   Collection Time: 11/09/16  7:05 PM  Result Value Ref Range Status   Specimen Description WOUND LEFT ARM  Final   Special Requests   Final    SWAB OF FLUID FROM STENT GRAFT LUMEN PT ON ANCEF, VANC   Gram Stain   Final    FEW WBC PRESENT,BOTH PMN AND MONONUCLEAR NO ORGANISMS SEEN    Culture FEW STAPHYLOCOCCUS AUREUS  Final   Report Status PENDING  Incomplete   Organism ID, Bacteria STAPHYLOCOCCUS AUREUS  Final      Susceptibility   Staphylococcus aureus - MIC*    CIPROFLOXACIN <=0.5 SENSITIVE Sensitive     ERYTHROMYCIN >=8 RESISTANT Resistant     GENTAMICIN <=0.5 SENSITIVE Sensitive     OXACILLIN 0.5 SENSITIVE Sensitive     TETRACYCLINE <=1 SENSITIVE Sensitive     VANCOMYCIN <=0.5 SENSITIVE Sensitive     TRIMETH/SULFA <=10 SENSITIVE Sensitive     CLINDAMYCIN <=0.25 SENSITIVE Sensitive     RIFAMPIN <=0.5 SENSITIVE Sensitive     Inducible Clindamycin NEGATIVE Sensitive     * FEW STAPHYLOCOCCUS AUREUS  Culture, blood (Routine X 2) w Reflex to ID Panel     Status: None (Preliminary result)   Collection Time: 11/10/16  8:41 AM  Result Value Ref Range Status   Specimen Description BLOOD RIGHT ARM  Final   Special Requests IN PEDIATRIC BOTTLE Blood Culture adequate volume  Final   Culture  Setup Time   Final    GRAM POSITIVE COCCI IN CLUSTERS IN PEDIATRIC BOTTLE CRITICAL VALUE NOTED.  VALUE IS CONSISTENT WITH PREVIOUSLY REPORTED AND CALLED VALUE.    Culture PENDING  Incomplete   Report Status PENDING  Incomplete     Liver Function Tests:  Recent Labs Lab 11/08/16 0000 11/09/16 0316 11/10/16 0247 11/11/16 0440  AST 17  --   --   --   ALT 12*  --   --   --   ALKPHOS 44  --   --   --   BILITOT 0.8  --   --   --   PROT 7.4  --   --   --   ALBUMIN 3.3* 2.7* 2.6* 2.6*   No results for input(s): LIPASE, AMYLASE in the last 168 hours. No results for  input(s): AMMONIA in the last 168 hours.  Cardiac Enzymes:  Recent Labs Lab 11/08/16 0753  TROPONINI 0.03*      Studies: Dg Chest Port 1 View  Result Date: 11/09/2016 CLINICAL DATA:  Central line placement EXAM: PORTABLE CHEST 1 VIEW COMPARISON:  11/08/2016 FINDINGS: Left-sided central venous catheter tip overlies the distal SVC. No left pneumothorax. Mild cardiomegaly with central vascular congestion. No focal consolidation IMPRESSION: Left-sided central venous catheter tip overlies the distal SVC. No left pneumothorax. Cardiomegaly with mild central vascular congestion Electronically Signed   By: Donavan Foil M.D.   On: 11/09/2016 20:01    Scheduled Meds: . amiodarone  200 mg Oral Daily  . atorvastatin  40 mg Oral q1800  . cinacalcet  30 mg Oral QPM  . doxercalciferol  2 mcg Intravenous Q T,Th,Sa-HD  . feeding supplement (NEPRO CARB STEADY)  237 mL Oral BID BM  . insulin aspart  0-5 Units Subcutaneous QHS  . insulin aspart  0-9 Units Subcutaneous TID WC  . multivitamin  1 tablet Oral QHS  . pantoprazole  40 mg Oral BID AC  .  QUEtiapine  25 mg Oral BID  . sodium chloride flush  3 mL Intravenous Q12H      Time spent: 25 min  [facility] Pager [phone]. If 7PM-7AM, please contact night-coverage at [email or web address], Office  [phone]  password [email or web address] 11/11/2016, 2:58 PM  LOS: 3 days

## 2016-11-12 DIAGNOSIS — B9561 Methicillin susceptible Staphylococcus aureus infection as the cause of diseases classified elsewhere: Secondary | ICD-10-CM

## 2016-11-12 DIAGNOSIS — R7881 Bacteremia: Secondary | ICD-10-CM

## 2016-11-12 LAB — CBC
HEMATOCRIT: 32.4 % — AB (ref 39.0–52.0)
Hemoglobin: 10.5 g/dL — ABNORMAL LOW (ref 13.0–17.0)
MCH: 29.2 pg (ref 26.0–34.0)
MCHC: 32.4 g/dL (ref 30.0–36.0)
MCV: 90 fL (ref 78.0–100.0)
Platelets: 221 10*3/uL (ref 150–400)
RBC: 3.6 MIL/uL — ABNORMAL LOW (ref 4.22–5.81)
RDW: 18.6 % — ABNORMAL HIGH (ref 11.5–15.5)
WBC: 4.8 10*3/uL (ref 4.0–10.5)

## 2016-11-12 LAB — GLUCOSE, CAPILLARY
GLUCOSE-CAPILLARY: 109 mg/dL — AB (ref 65–99)
GLUCOSE-CAPILLARY: 132 mg/dL — AB (ref 65–99)
GLUCOSE-CAPILLARY: 147 mg/dL — AB (ref 65–99)
Glucose-Capillary: 142 mg/dL — ABNORMAL HIGH (ref 65–99)

## 2016-11-12 LAB — AEROBIC CULTURE W GRAM STAIN (SUPERFICIAL SPECIMEN)

## 2016-11-12 LAB — RENAL FUNCTION PANEL
Albumin: 2.5 g/dL — ABNORMAL LOW (ref 3.5–5.0)
Anion gap: 11 (ref 5–15)
BUN: 39 mg/dL — ABNORMAL HIGH (ref 6–20)
CHLORIDE: 99 mmol/L — AB (ref 101–111)
CO2: 24 mmol/L (ref 22–32)
Calcium: 7.7 mg/dL — ABNORMAL LOW (ref 8.9–10.3)
Creatinine, Ser: 9.58 mg/dL — ABNORMAL HIGH (ref 0.61–1.24)
GFR calc Af Amer: 6 mL/min — ABNORMAL LOW (ref >60)
GFR calc non Af Amer: 5 mL/min — ABNORMAL LOW (ref >60)
Glucose, Bld: 99 mg/dL (ref 65–99)
PHOSPHORUS: 3.2 mg/dL (ref 2.5–4.6)
Potassium: 4 mmol/L (ref 3.5–5.1)
Sodium: 134 mmol/L — ABNORMAL LOW (ref 135–145)

## 2016-11-12 LAB — PROTIME-INR
INR: 1.41
Prothrombin Time: 17.4 seconds — ABNORMAL HIGH (ref 11.4–15.2)

## 2016-11-12 LAB — AEROBIC CULTURE  (SUPERFICIAL SPECIMEN)

## 2016-11-12 MED ORDER — HEPARIN SODIUM (PORCINE) 1000 UNIT/ML DIALYSIS: 1800.0000 [IU] | Freq: Once | INTRAMUSCULAR | Status: DC

## 2016-11-12 MED ORDER — ALTEPLASE 2 MG IJ SOLR
2.0000 mg | Freq: Once | INTRAMUSCULAR | Status: DC | PRN
Start: 2016-11-12 — End: 2016-11-12

## 2016-11-12 MED ORDER — SODIUM CHLORIDE 0.9 % IV SOLN: 100.0000 mL | INTRAVENOUS | Status: DC | PRN

## 2016-11-12 MED ORDER — PENTAFLUOROPROP-TETRAFLUOROETH EX AERO: 1.0000 "application " | INHALATION_SPRAY | CUTANEOUS | Status: DC | PRN

## 2016-11-12 MED ORDER — HEPARIN SODIUM (PORCINE) 1000 UNIT/ML DIALYSIS: 1000.0000 [IU] | INTRAMUSCULAR | Status: DC | PRN

## 2016-11-12 MED ORDER — LIDOCAINE-PRILOCAINE 2.5-2.5 % EX CREA: 1.0000 "application " | TOPICAL_CREAM | CUTANEOUS | Status: DC | PRN

## 2016-11-12 MED ORDER — DOXERCALCIFEROL 4 MCG/2ML IV SOLN
INTRAVENOUS | Status: AC
  Administered 2016-11-12: 2 ug via INTRAVENOUS
  Filled 2016-11-12: qty 2

## 2016-11-12 MED ORDER — LIDOCAINE HCL (PF) 1 % IJ SOLN: 5.0000 mL | INTRAMUSCULAR | Status: DC | PRN

## 2016-11-12 NOTE — Progress Notes (Addendum)
[facility]  PROGRESS NOTE  Oscar Castillo PYP:[id number] DOB: 31-Mar-1951 DOA: 11/07/2016 PCP: [name], MD   Brief HPI:    66 y.o.malewith a past medical history significant for ESRD on HD MWF, CHF EF 50%, Afib on amiodarone and warfarin, NIDDM and bilateral BKAwho presents with fever, malaise.  He was admitted with sepsis thought secondary to infected left arm fistula/stent.  He has staph aureus bacteremia.  ID consulted.  VVS and nephrology following.  Pt to OR 4/16 for removal of stents and placement of temp dialysis catheter.       Subjective   Patient seen and examined, denies any complaints.   Assessment/Plan:     1. Sepsis- secondary to infected AVF, he was taken to or on 4/16 for AVF removal and placement of temporary dialysis catheter. He is currently on IV cefazolin per ID. Lactic acid on 11/08/2016 was 1.2. 2. Staph aureus bacteremia- Blood cultures grew methicillin sensitive staph aureus, treated with cefazolin as above. Tunneled catheter will be placed, once bacteremia is cleared.As blood cultures remain negative, will need tunneled catheter placement on Friday or Saturday. 3. ESRD on dialysis- patient gets dialysis Tuesday Thursday and Saturday. Nephrology following. 4. Diabetes mellitus- blood was controlled, continue sliding scale insulin with NovoLog. 5. Hypertension- blood pressure stable. 6. History of atrial fibrillation/atrial flutter- heart rate is controlled, continue amiodarone. Coumadin is currently on hold, after AVF removal on 11/09/2016. We'll restart Coumadin when okay with vascular surgery. 7. Bipolar disorder/depression-continue Seroquel.     DVT prophylaxis: Warfarin  Code Status: Full code  Family Communication: No family at bedside  Disposition Plan: Home versus skilled nursing facility   Consultants:  Vascular surgery  Nephrology  Infectious disease  Procedures:  4/16 stent removal and temp dialysis catheter  placement  Continuous infusions . sodium chloride 10 mL/hr at 11/09/16 1622  .  ceFAZolin (ANCEF) IV        Antibiotics:   Anti-infectives    Start     Dose/Rate Route Frequency Ordered Stop   11/10/16 1200  ceFAZolin (ANCEF) IVPB 2g/100 mL premix     2 g 200 mL/hr over 30 Minutes Intravenous Every T-Th-Sa (Hemodialysis) 11/08/16 1824     11/09/16 1200  vancomycin (VANCOCIN) IVPB 750 mg/150 ml premix  Status:  Discontinued     750 mg 150 mL/hr over 60 Minutes Intravenous Every M-W-F (Hemodialysis) 11/08/16 0421 11/08/16 1758   11/09/16 0000  vancomycin (VANCOCIN) IVPB 1000 mg/200 mL premix  Status:  Discontinued     1,000 mg 200 mL/hr over 60 Minutes Intravenous To Surgery 11/08/16 1949 11/08/16 1959   11/08/16 1915  ceFAZolin (ANCEF) IVPB 2g/100 mL premix     2 g 200 mL/hr over 30 Minutes Intravenous  Once 11/08/16 1824 11/08/16 2055   11/08/16 1000  piperacillin-tazobactam (ZOSYN) IVPB 3.375 g  Status:  Discontinued     3.375 g 12.5 mL/hr over 240 Minutes Intravenous Every 12 hours 11/08/16 0421 11/08/16 1758   11/08/16 0500  vancomycin (VANCOCIN) 500 mg in sodium chloride 0.9 % 100 mL IVPB     500 mg 100 mL/hr over 60 Minutes Intravenous  Once 11/08/16 0421 11/08/16 0637   11/08/16 0030  piperacillin-tazobactam (ZOSYN) IVPB 3.375 g     3.375 g 100 mL/hr over 30 Minutes Intravenous  Once 11/08/16 0017 11/08/16 0109   11/08/16 0030  vancomycin (VANCOCIN) 1,500 mg in sodium chloride 0.9 % 500 mL IVPB  Status:  Discontinued     1,500 mg 250 mL/hr  over 120 Minutes Intravenous  Once 11/08/16 0018 11/08/16 0027   11/08/16 0030  vancomycin (VANCOCIN) IVPB 1000 mg/200 mL premix     1,000 mg 200 mL/hr over 60 Minutes Intravenous  Once 11/08/16 0028 11/08/16 0142   11/08/16 0015  vancomycin (VANCOCIN) IVPB 1000 mg/200 mL premix  Status:  Discontinued     1,000 mg 200 mL/hr over 60 Minutes Intravenous  Once 11/08/16 0014 11/08/16 0017       Objective   Vitals:   11/12/16 1030  11/12/16 1037 11/12/16 1045 11/12/16 1111  BP: (!) 83/61 (!) 89/67 (!) 89/67 120/78  Pulse: 83 84 84 82  Resp:    17  Temp:    98.1 F (36.7 C)  TempSrc:    Oral  SpO2:    100%  Weight:    85.4 kg (188 lb 4.4 oz)  Height:        Intake/Output Summary (Last 24 hours) at 11/12/16 1622 Last data filed at 11/12/16 1111  Gross per 24 hour  Intake              360 ml  Output            -2784 ml  Net             3144 ml   Filed Weights   11/12/16 0455 11/12/16 0705 11/12/16 1111  Weight: 89.4 kg (197 lb 1.5 oz) 88.6 kg (195 lb 5.2 oz) 85.4 kg (188 lb 4.4 oz)     Physical Examination:  Physical Exam: Eyes: No icterus, extraocular muscles intact  Mouth: Oral mucosa is moist, no lesions on palate,  Neck: Supple, no deformities, masses, or tenderness Lungs: Normal respiratory effort, bilateral clear to auscultation, no crackles or wheezes.  Heart: Regular rate and rhythm, S1 and S2 normal, no murmurs, rubs auscultated Abdomen: BS normoactive,soft,nondistended,non-tender to palpation,no organomegaly Extremities: B/L s/p BKA Neuro : Alert and oriented to time, place and person, No focal deficits Skin: Left arm swelling noted at the antecubital fossa.    Data Reviewed: I have personally reviewed following labs and imaging studies  CBG:  Recent Labs Lab 11/11/16 1754 11/11/16 2118 11/12/16 0457 11/12/16 1147 11/12/16 1606  GLUCAP 159* 129* 109* 132* 142*    CBC:  Recent Labs Lab 11/08/16 0000 11/09/16 0316 11/10/16 0247 11/11/16 0440 11/12/16 0711  WBC 6.1 5.2 4.9 3.9* 4.8  NEUTROABS 4.7  --   --   --   --   HGB 12.9* 10.9* 10.8* 10.4* 10.5*  HCT 40.0 33.8* 33.5* 32.2* 32.4*  MCV 93.0 91.1 90.1 89.7 90.0  PLT 147* 152 155 172 250    Basic Metabolic Panel:  Recent Labs Lab 11/08/16 0000 11/09/16 0316 11/10/16 0247 11/11/16 0440 11/12/16 0711  NA 135 136 134* 134* 134*  K 4.0 4.5 3.7 3.8 4.0  CL 93* 101 100* 98* 99*  CO2 26 23 24 26 24   GLUCOSE 201*  111* 118* 101* 99  BUN 32* 43* 48* 25* 39*  CREATININE 8.26* 9.36* 10.60* 7.61* 9.58*  CALCIUM 8.9 8.3* 8.2* 7.9* 7.7*  PHOS  --  2.5 1.9* 1.9* 3.2    Recent Results (from the past 240 hour(s))  Culture, blood (Routine x 2)     Status: Abnormal (Preliminary result)   Collection Time: 11/08/16 12:00 AM  Result Value Ref Range Status   Specimen Description BLOOD RIGHT ARM  Final   Special Requests   Final    BOTTLES DRAWN AEROBIC AND ANAEROBIC Blood  Culture adequate volume   Culture  Setup Time   Final    GRAM POSITIVE COCCI IN CLUSTERS ANAEROBIC BOTTLE ONLY CRITICAL VALUE NOTED.  VALUE IS CONSISTENT WITH PREVIOUSLY REPORTED AND CALLED VALUE.    Culture (A)  Final    STAPHYLOCOCCUS AUREUS SUSCEPTIBILITIES PERFORMED ON PREVIOUS CULTURE WITHIN THE LAST 5 DAYS.    Report Status PENDING  Incomplete  Culture, blood (Routine x 2)     Status: Abnormal   Collection Time: 11/08/16 12:12 AM  Result Value Ref Range Status   Specimen Description BLOOD RIGHT HAND  Final   Special Requests IN PEDIATRIC BOTTLE Blood Culture adequate volume  Final   Culture  Setup Time   Final    GRAM POSITIVE COCCI IN CLUSTERS IN PEDIATRIC BOTTLE CRITICAL RESULT CALLED TO, READ BACK BY AND VERIFIED WITH: T STONE, PHARM, 11/08/16 AT 1616 BY J FUDESCO    Culture STAPHYLOCOCCUS AUREUS (A)  Final   Report Status 11/10/2016 FINAL  Final   Organism ID, Bacteria STAPHYLOCOCCUS AUREUS  Final      Susceptibility   Staphylococcus aureus - MIC*    CIPROFLOXACIN <=0.5 SENSITIVE Sensitive     ERYTHROMYCIN >=8 RESISTANT Resistant     GENTAMICIN <=0.5 SENSITIVE Sensitive     OXACILLIN 0.5 SENSITIVE Sensitive     TETRACYCLINE <=1 SENSITIVE Sensitive     VANCOMYCIN <=0.5 SENSITIVE Sensitive     TRIMETH/SULFA <=10 SENSITIVE Sensitive     CLINDAMYCIN <=0.25 SENSITIVE Sensitive     RIFAMPIN <=0.5 SENSITIVE Sensitive     Inducible Clindamycin NEGATIVE Sensitive     * STAPHYLOCOCCUS AUREUS  Blood Culture ID Panel  (Reflexed)     Status: Abnormal   Collection Time: 11/08/16 12:12 AM  Result Value Ref Range Status   Enterococcus species NOT DETECTED NOT DETECTED Final   Listeria monocytogenes NOT DETECTED NOT DETECTED Final   Staphylococcus species DETECTED (A) NOT DETECTED Final    Comment: CRITICAL RESULT CALLED TO, READ BACK BY AND VERIFIED WITH: T. STONE, PHARM, 11/08/16 AT 1616 BY J FUDESCO    Staphylococcus aureus DETECTED (A) NOT DETECTED Final    Comment: Methicillin (oxacillin) susceptible Staphylococcus aureus (MSSA). Preferred therapy is anti staphylococcal beta lactam antibiotic (Cefazolin or Nafcillin), unless clinically contraindicated. CRITICAL RESULT CALLED TO, READ BACK BY AND VERIFIED WITH: T. STONE, PHARM, 11/08/16 AT 1616 BY J FUDESCO    Methicillin resistance NOT DETECTED NOT DETECTED Final   Streptococcus species NOT DETECTED NOT DETECTED Final   Streptococcus agalactiae NOT DETECTED NOT DETECTED Final   Streptococcus pneumoniae NOT DETECTED NOT DETECTED Final   Streptococcus pyogenes NOT DETECTED NOT DETECTED Final   Acinetobacter baumannii NOT DETECTED NOT DETECTED Final   Enterobacteriaceae species NOT DETECTED NOT DETECTED Final   Enterobacter cloacae complex NOT DETECTED NOT DETECTED Final   Escherichia coli NOT DETECTED NOT DETECTED Final   Klebsiella oxytoca NOT DETECTED NOT DETECTED Final   Klebsiella pneumoniae NOT DETECTED NOT DETECTED Final   Proteus species NOT DETECTED NOT DETECTED Final   Serratia marcescens NOT DETECTED NOT DETECTED Final   Haemophilus influenzae NOT DETECTED NOT DETECTED Final   Neisseria meningitidis NOT DETECTED NOT DETECTED Final   Pseudomonas aeruginosa NOT DETECTED NOT DETECTED Final   Candida albicans NOT DETECTED NOT DETECTED Final   Candida glabrata NOT DETECTED NOT DETECTED Final   Candida krusei NOT DETECTED NOT DETECTED Final   Candida parapsilosis NOT DETECTED NOT DETECTED Final   Candida tropicalis NOT DETECTED NOT DETECTED Final   MRSA PCR  Screening     Status: None   Collection Time: 11/08/16  5:49 AM  Result Value Ref Range Status   MRSA by PCR NEGATIVE NEGATIVE Final    Comment:        The GeneXpert MRSA Assay (FDA approved for NASAL specimens only), is one component of a comprehensive MRSA colonization surveillance program. It is not intended to diagnose MRSA infection nor to guide or monitor treatment for MRSA infections.   Aerobic Culture (superficial specimen)     Status: None   Collection Time: 11/09/16  7:05 PM  Result Value Ref Range Status   Specimen Description WOUND LEFT ARM  Final   Special Requests   Final    SWAB OF FLUID FROM STENT GRAFT LUMEN PT ON ANCEF, VANC   Gram Stain   Final    FEW WBC PRESENT,BOTH PMN AND MONONUCLEAR NO ORGANISMS SEEN    Culture FEW STAPHYLOCOCCUS AUREUS  Final   Report Status 11/12/2016 FINAL  Final   Organism ID, Bacteria STAPHYLOCOCCUS AUREUS  Final      Susceptibility   Staphylococcus aureus - MIC*    CIPROFLOXACIN <=0.5 SENSITIVE Sensitive     ERYTHROMYCIN >=8 RESISTANT Resistant     GENTAMICIN <=0.5 SENSITIVE Sensitive     OXACILLIN 0.5 SENSITIVE Sensitive     TETRACYCLINE <=1 SENSITIVE Sensitive     VANCOMYCIN <=0.5 SENSITIVE Sensitive     TRIMETH/SULFA <=10 SENSITIVE Sensitive     CLINDAMYCIN <=0.25 SENSITIVE Sensitive     RIFAMPIN <=0.5 SENSITIVE Sensitive     Inducible Clindamycin NEGATIVE Sensitive     * FEW STAPHYLOCOCCUS AUREUS  Culture, blood (Routine X 2) w Reflex to ID Panel     Status: Abnormal (Preliminary result)   Collection Time: 11/10/16  8:41 AM  Result Value Ref Range Status   Specimen Description BLOOD RIGHT ARM  Final   Special Requests IN PEDIATRIC BOTTLE Blood Culture adequate volume  Final   Culture  Setup Time   Final    GRAM POSITIVE COCCI IN CLUSTERS IN PEDIATRIC BOTTLE CRITICAL VALUE NOTED.  VALUE IS CONSISTENT WITH PREVIOUSLY REPORTED AND CALLED VALUE.    Culture STAPHYLOCOCCUS HOMINIS (A)  Final   Report Status  PENDING  Incomplete  Culture, blood (single)     Status: None (Preliminary result)   Collection Time: 11/11/16 11:42 AM  Result Value Ref Range Status   Specimen Description BLOOD RIGHT ARM  Final   Special Requests   Final    BOTTLES DRAWN AEROBIC AND ANAEROBIC Blood Culture adequate volume   Culture NO GROWTH < 24 HOURS  Final   Report Status PENDING  Incomplete     Liver Function Tests:  Recent Labs Lab 11/08/16 0000 11/09/16 0316 11/10/16 0247 11/11/16 0440 11/12/16 0711  AST 17  --   --   --   --   ALT 12*  --   --   --   --   ALKPHOS 44  --   --   --   --   BILITOT 0.8  --   --   --   --   PROT 7.4  --   --   --   --   ALBUMIN 3.3* 2.7* 2.6* 2.6* 2.5*   No results for input(s): LIPASE, AMYLASE in the last 168 hours. No results for input(s): AMMONIA in the last 168 hours.  Cardiac Enzymes:  Recent Labs Lab 11/08/16 0753  TROPONINI 0.03*      Studies: No results found.  Scheduled Meds: . amiodarone  200 mg Oral Daily  . atorvastatin  40 mg Oral q1800  . cinacalcet  30 mg Oral QPM  . doxercalciferol  2 mcg Intravenous Q T,Th,Sa-HD  . feeding supplement (NEPRO CARB STEADY)  237 mL Oral BID BM  . insulin aspart  0-5 Units Subcutaneous QHS  . insulin aspart  0-9 Units Subcutaneous TID WC  . multivitamin  1 tablet Oral QHS  . pantoprazole  40 mg Oral BID AC  . QUEtiapine  25 mg Oral BID  . sodium chloride flush  3 mL Intravenous Q12H      Time spent: 25 min  [facility] Pager [phone]. If 7PM-7AM, please contact night-coverage at [email or web address], Office  [phone]  password [email or web address] 11/12/2016, 4:22 PM  LOS: 4 days

## 2016-11-12 NOTE — Progress Notes (Signed)
[facility]  Subjective  - feels ok   Objective 118/75 81 98 F (36.7 C) (Oral) 15 99%  Intake/Output Summary (Last 24 hours) at 11/12/16 0747 Last data filed at 11/12/16 0600  Gross per 24 hour  Intake              840 ml  Output                1 ml  Net              839 ml   Left upper extremity incision without drainage or erythema Still mild edema in hand arm  Assessment/Planning: s/p excision left arm AVF/infected stent, growing multidrug sensistive staph Antibiotics per ID Please call when ready for tunneled catheter  Ruta Hinds 11/12/2016 7:47 AM --  Laboratory Lab Results:  Recent Labs  11/11/16 0440 11/12/16 0711  WBC 3.9* 4.8  HGB 10.4* 10.5*  HCT 32.2* 32.4*  PLT 172 221   BMET  Recent Labs  11/10/16 0247 11/11/16 0440  NA 134* 134*  K 3.7 3.8  CL 100* 98*  CO2 24 26  GLUCOSE 118* 101*  BUN 48* 25*  CREATININE 10.60* 7.61*  CALCIUM 8.2* 7.9*    COAG Lab Results  Component Value Date   INR 1.41 11/12/2016   INR 1.75 11/11/2016   INR 1.81 11/10/2016   No results found for: PTT

## 2016-11-12 NOTE — Progress Notes (Signed)
PHARMACY CONSULT NOTE FOR:  OUTPATIENT  PARENTERAL ANTIBIOTIC THERAPY (OPAT)  Indication: MSSA Bacteremia Regimen: Ancef 2g IV qHD End date: 11/26/2016  IV antibiotic discharge orders are pended. To discharging provider:  please sign these orders via discharge navigator,  Select New Orders & click on the button choice - Manage This Unsigned Work.     Thank you for allowing pharmacy to be a part of this patient's care.  Dimitri Ped, PharmD, BCPS PGY-2 Infectious Diseases Pharmacy Resident Pager: [phone] 11/12/2016, 5:41 PM

## 2016-11-12 NOTE — Progress Notes (Signed)
Refused labs rescheduled for later.

## 2016-11-12 NOTE — Progress Notes (Signed)
   Daily Progress Note   Renal requesting TDC placement tomorrow.  Pt scheduled for early afternoon.  Preop orders in computer   Laboratory CBC CBC Latest Ref Rng & Units 11/12/2016 11/11/2016 11/10/2016  WBC 4.0 - 10.5 K/uL 4.8 3.9(L) 4.9  Hemoglobin 13.0 - 17.0 g/dL 10.5(L) 10.4(L) 10.8(L)  Hematocrit 39.0 - 52.0 % 32.4(L) 32.2(L) 33.5(L)  Platelets 150 - 400 K/uL 221 172 155     BMET    Component Value Date/Time   NA 134 (L) 11/12/2016 0711   K 4.0 11/12/2016 0711   CL 99 (L) 11/12/2016 0711   CO2 24 11/12/2016 0711   GLUCOSE 99 11/12/2016 0711   BUN 39 (H) 11/12/2016 0711   CREATININE 9.58 (H) 11/12/2016 0711   CREATININE 8.97 (H) 10/07/2015 1500   CALCIUM 7.7 (L) 11/12/2016 0711   GFRNONAA 5 (L) 11/12/2016 0711   GFRNONAA 6 (L) 10/07/2015 1500   GFRAA 6 (L) 11/12/2016 0711   GFRAA 6 (L) 10/07/2015 1500      Adele Barthel, MD, FACS [facility] Office: [phone] Pager: [phone]  11/12/2016, 4:29 PM

## 2016-11-12 NOTE — Progress Notes (Addendum)
INFECTIOUS DISEASE PROGRESS NOTE  ID: Oscar Castillo is a 66 y.o. male with  Principal Problem:   Sepsis ([place]) Active Problems:   Hypertensive heart disease with CHF (congestive heart failure) ([place])   Type 2 diabetes mellitus with chronic kidney disease on chronic dialysis, with long-term current use of insulin (HCC)   Chronic combined systolic and diastolic congestive heart failure   ESRD on hemodialysis (HCC)   S/P BKA (below knee amputation) bilateral (HCC)   Paroxysmal atrial fibrillation (HCC)   Anemia due to stage 5 chronic kidney disease ([place])  Subjective: Without complaints.   Abtx:  Anti-infectives    Start     Dose/Rate Route Frequency Ordered Stop   11/10/16 1200  ceFAZolin (ANCEF) IVPB 2g/100 mL premix     2 g 200 mL/hr over 30 Minutes Intravenous Every T-Th-Sa (Hemodialysis) 11/08/16 1824     11/09/16 1200  vancomycin (VANCOCIN) IVPB 750 mg/150 ml premix  Status:  Discontinued     750 mg 150 mL/hr over 60 Minutes Intravenous Every M-W-F (Hemodialysis) 11/08/16 0421 11/08/16 1758   11/09/16 0000  vancomycin (VANCOCIN) IVPB 1000 mg/200 mL premix  Status:  Discontinued     1,000 mg 200 mL/hr over 60 Minutes Intravenous To Surgery 11/08/16 1949 11/08/16 1959   11/08/16 1915  ceFAZolin (ANCEF) IVPB 2g/100 mL premix     2 g 200 mL/hr over 30 Minutes Intravenous  Once 11/08/16 1824 11/08/16 2055   11/08/16 1000  piperacillin-tazobactam (ZOSYN) IVPB 3.375 g  Status:  Discontinued     3.375 g 12.5 mL/hr over 240 Minutes Intravenous Every 12 hours 11/08/16 0421 11/08/16 1758   11/08/16 0500  vancomycin (VANCOCIN) 500 mg in sodium chloride 0.9 % 100 mL IVPB     500 mg 100 mL/hr over 60 Minutes Intravenous  Once 11/08/16 0421 11/08/16 0637   11/08/16 0030  piperacillin-tazobactam (ZOSYN) IVPB 3.375 g     3.375 g 100 mL/hr over 30 Minutes Intravenous  Once 11/08/16 0017 11/08/16 0109   11/08/16 0030  vancomycin (VANCOCIN) 1,500 mg in sodium chloride 0.9 % 500 mL IVPB   Status:  Discontinued     1,500 mg 250 mL/hr over 120 Minutes Intravenous  Once 11/08/16 0018 11/08/16 0027   11/08/16 0030  vancomycin (VANCOCIN) IVPB 1000 mg/200 mL premix     1,000 mg 200 mL/hr over 60 Minutes Intravenous  Once 11/08/16 0028 11/08/16 0142   11/08/16 0015  vancomycin (VANCOCIN) IVPB 1000 mg/200 mL premix  Status:  Discontinued     1,000 mg 200 mL/hr over 60 Minutes Intravenous  Once 11/08/16 0014 11/08/16 0017      Medications:  Scheduled: . amiodarone  200 mg Oral Daily  . atorvastatin  40 mg Oral q1800  . cinacalcet  30 mg Oral QPM  . doxercalciferol  2 mcg Intravenous Q T,Th,Sa-HD  . feeding supplement (NEPRO CARB STEADY)  237 mL Oral BID BM  . insulin aspart  0-5 Units Subcutaneous QHS  . insulin aspart  0-9 Units Subcutaneous TID WC  . multivitamin  1 tablet Oral QHS  . pantoprazole  40 mg Oral BID AC  . QUEtiapine  25 mg Oral BID  . sodium chloride flush  3 mL Intravenous Q12H    Objective: Vital signs in last 24 hours: Temp:  [97.6 F (36.4 C)-98.2 F (36.8 C)] 98.1 F (36.7 C) (04/19 1111) Pulse Rate:  [69-84] 82 (04/19 1111) Resp:  [15-18] 17 (04/19 1111) BP: (81-163)/(58-96) 120/78 (04/19 1111) SpO2:  [  99 %-100 %] 100 % (04/19 1111) Weight:  [85.4 kg (188 lb 4.4 oz)-89.4 kg (197 lb 1.5 oz)] 85.4 kg (188 lb 4.4 oz) (04/19 1111)   General appearance: alert, cooperative and no distress Neck: L neck IJ/HD line. non-tender.  Resp: clear to auscultation bilaterally Cardio: regular rate and rhythm GI: normal findings: bowel sounds normal and soft, non-tender Extremities: L UE wound is clean.   Lab Results  Recent Labs  11/11/16 0440 11/12/16 0711  WBC 3.9* 4.8  HGB 10.4* 10.5*  HCT 32.2* 32.4*  NA 134* 134*  K 3.8 4.0  CL 98* 99*  CO2 26 24  BUN 25* 39*  CREATININE 7.61* 9.58*   Liver Panel  Recent Labs  11/11/16 0440 11/12/16 0711  ALBUMIN 2.6* 2.5*   Sedimentation Rate No results for input(s): ESRSEDRATE in the last 72  hours. C-Reactive Protein No results for input(s): CRP in the last 72 hours.  Microbiology: Recent Results (from the past 240 hour(s))  Culture, blood (Routine x 2)     Status: Abnormal (Preliminary result)   Collection Time: 11/08/16 12:00 AM  Result Value Ref Range Status   Specimen Description BLOOD RIGHT ARM  Final   Special Requests   Final    BOTTLES DRAWN AEROBIC AND ANAEROBIC Blood Culture adequate volume   Culture  Setup Time   Final    GRAM POSITIVE COCCI IN CLUSTERS ANAEROBIC BOTTLE ONLY CRITICAL VALUE NOTED.  VALUE IS CONSISTENT WITH PREVIOUSLY REPORTED AND CALLED VALUE.    Culture (A)  Final    STAPHYLOCOCCUS AUREUS SUSCEPTIBILITIES PERFORMED ON PREVIOUS CULTURE WITHIN THE LAST 5 DAYS.    Report Status PENDING  Incomplete  Culture, blood (Routine x 2)     Status: Abnormal   Collection Time: 11/08/16 12:12 AM  Result Value Ref Range Status   Specimen Description BLOOD RIGHT HAND  Final   Special Requests IN PEDIATRIC BOTTLE Blood Culture adequate volume  Final   Culture  Setup Time   Final    GRAM POSITIVE COCCI IN CLUSTERS IN PEDIATRIC BOTTLE CRITICAL RESULT CALLED TO, READ BACK BY AND VERIFIED WITH: T STONE, PHARM, 11/08/16 AT 1616 BY J FUDESCO    Culture STAPHYLOCOCCUS AUREUS (A)  Final   Report Status 11/10/2016 FINAL  Final   Organism ID, Bacteria STAPHYLOCOCCUS AUREUS  Final      Susceptibility   Staphylococcus aureus - MIC*    CIPROFLOXACIN <=0.5 SENSITIVE Sensitive     ERYTHROMYCIN >=8 RESISTANT Resistant     GENTAMICIN <=0.5 SENSITIVE Sensitive     OXACILLIN 0.5 SENSITIVE Sensitive     TETRACYCLINE <=1 SENSITIVE Sensitive     VANCOMYCIN <=0.5 SENSITIVE Sensitive     TRIMETH/SULFA <=10 SENSITIVE Sensitive     CLINDAMYCIN <=0.25 SENSITIVE Sensitive     RIFAMPIN <=0.5 SENSITIVE Sensitive     Inducible Clindamycin NEGATIVE Sensitive     * STAPHYLOCOCCUS AUREUS  Blood Culture ID Panel (Reflexed)     Status: Abnormal   Collection Time: 11/08/16 12:12 AM    Result Value Ref Range Status   Enterococcus species NOT DETECTED NOT DETECTED Final   Listeria monocytogenes NOT DETECTED NOT DETECTED Final   Staphylococcus species DETECTED (A) NOT DETECTED Final    Comment: CRITICAL RESULT CALLED TO, READ BACK BY AND VERIFIED WITH: T. STONE, PHARM, 11/08/16 AT 1616 BY J FUDESCO    Staphylococcus aureus DETECTED (A) NOT DETECTED Final    Comment: Methicillin (oxacillin) susceptible Staphylococcus aureus (MSSA). Preferred therapy is anti staphylococcal beta  lactam antibiotic (Cefazolin or Nafcillin), unless clinically contraindicated. CRITICAL RESULT CALLED TO, READ BACK BY AND VERIFIED WITH: T. STONE, PHARM, 11/08/16 AT 1616 BY J FUDESCO    Methicillin resistance NOT DETECTED NOT DETECTED Final   Streptococcus species NOT DETECTED NOT DETECTED Final   Streptococcus agalactiae NOT DETECTED NOT DETECTED Final   Streptococcus pneumoniae NOT DETECTED NOT DETECTED Final   Streptococcus pyogenes NOT DETECTED NOT DETECTED Final   Acinetobacter baumannii NOT DETECTED NOT DETECTED Final   Enterobacteriaceae species NOT DETECTED NOT DETECTED Final   Enterobacter cloacae complex NOT DETECTED NOT DETECTED Final   Escherichia coli NOT DETECTED NOT DETECTED Final   Klebsiella oxytoca NOT DETECTED NOT DETECTED Final   Klebsiella pneumoniae NOT DETECTED NOT DETECTED Final   Proteus species NOT DETECTED NOT DETECTED Final   Serratia marcescens NOT DETECTED NOT DETECTED Final   Haemophilus influenzae NOT DETECTED NOT DETECTED Final   Neisseria meningitidis NOT DETECTED NOT DETECTED Final   Pseudomonas aeruginosa NOT DETECTED NOT DETECTED Final   Candida albicans NOT DETECTED NOT DETECTED Final   Candida glabrata NOT DETECTED NOT DETECTED Final   Candida krusei NOT DETECTED NOT DETECTED Final   Candida parapsilosis NOT DETECTED NOT DETECTED Final   Candida tropicalis NOT DETECTED NOT DETECTED Final  MRSA PCR Screening     Status: None   Collection Time: 11/08/16   5:49 AM  Result Value Ref Range Status   MRSA by PCR NEGATIVE NEGATIVE Final    Comment:        The GeneXpert MRSA Assay (FDA approved for NASAL specimens only), is one component of a comprehensive MRSA colonization surveillance program. It is not intended to diagnose MRSA infection nor to guide or monitor treatment for MRSA infections.   Aerobic Culture (superficial specimen)     Status: None   Collection Time: 11/09/16  7:05 PM  Result Value Ref Range Status   Specimen Description WOUND LEFT ARM  Final   Special Requests   Final    SWAB OF FLUID FROM STENT GRAFT LUMEN PT ON ANCEF, VANC   Gram Stain   Final    FEW WBC PRESENT,BOTH PMN AND MONONUCLEAR NO ORGANISMS SEEN    Culture FEW STAPHYLOCOCCUS AUREUS  Final   Report Status 11/12/2016 FINAL  Final   Organism ID, Bacteria STAPHYLOCOCCUS AUREUS  Final      Susceptibility   Staphylococcus aureus - MIC*    CIPROFLOXACIN <=0.5 SENSITIVE Sensitive     ERYTHROMYCIN >=8 RESISTANT Resistant     GENTAMICIN <=0.5 SENSITIVE Sensitive     OXACILLIN 0.5 SENSITIVE Sensitive     TETRACYCLINE <=1 SENSITIVE Sensitive     VANCOMYCIN <=0.5 SENSITIVE Sensitive     TRIMETH/SULFA <=10 SENSITIVE Sensitive     CLINDAMYCIN <=0.25 SENSITIVE Sensitive     RIFAMPIN <=0.5 SENSITIVE Sensitive     Inducible Clindamycin NEGATIVE Sensitive     * FEW STAPHYLOCOCCUS AUREUS  Culture, blood (Routine X 2) w Reflex to ID Panel     Status: Abnormal (Preliminary result)   Collection Time: 11/10/16  8:41 AM  Result Value Ref Range Status   Specimen Description BLOOD RIGHT ARM  Final   Special Requests IN PEDIATRIC BOTTLE Blood Culture adequate volume  Final   Culture  Setup Time   Final    GRAM POSITIVE COCCI IN CLUSTERS IN PEDIATRIC BOTTLE CRITICAL VALUE NOTED.  VALUE IS CONSISTENT WITH PREVIOUSLY REPORTED AND CALLED VALUE.    Culture STAPHYLOCOCCUS HOMINIS (A)  Final   Report Status  PENDING  Incomplete  Culture, blood (single)     Status: None  (Preliminary result)   Collection Time: 11/11/16 11:42 AM  Result Value Ref Range Status   Specimen Description BLOOD RIGHT ARM  Final   Special Requests   Final    BOTTLES DRAWN AEROBIC AND ANAEROBIC Blood Culture adequate volume   Culture NO GROWTH < 24 HOURS  Final   Report Status PENDING  Incomplete    Studies/Results: No results found.   Assessment/Plan: Excision of L UVF 4-16 L IJ HD line 4-16 R IJ thrombosis  Staph/MSSA bacteremia Will continueAncef His BCx from 4-16 is again positive but for S hominis. Suspect this is a contaminant  Repeat BCx sent 4-17.  ntgd  With only 1 set of BCx positive for MSSA, could hold on TEE with known source.  Would give him 2 weeks of ancef with HD after he is d/c.  opat consult  Available as needed.   ESRD Appears his bacteremia is resolved.  Plan for new HD catheter soon.   Hep C Treated 2017 RNA negative last 2 draws  Total days of antibiotics: 4 ancef         Bobby Rumpf Infectious Diseases (pager) [phone] [email or web address] 11/12/2016, 5:24 PM  LOS: 4 days

## 2016-11-12 NOTE — Progress Notes (Signed)
Subjective:  No complaints, on HD.  Blood cx's from yest are neg to date.   Objective Vital signs in last 24 hours: Vitals:   11/12/16 1030 11/12/16 1037 11/12/16 1045 11/12/16 1111  BP: (!) 83/61 (!) 89/67 (!) 89/67 120/78  Pulse: 83 84 84 82  Resp:    17  Temp:    98.1 F (36.7 C)  TempSrc:    Oral  SpO2:    100%  Weight:    85.4 kg (188 lb 4.4 oz)  Height:       Weight change: -1.6 kg (-3 lb 8.4 oz)  Physical Exam: General: alert  NAD , OX3 .  Lungs: CTA  nonlabored breathing  Heart:RRR no mur, or rub .  Abdomen: soft NT + BS Lower extremities: Bilateral BKA with no edema Dialysis Access: left upper AVF clean dry surgical site / L IJ Temp  HD cath   [facility]   83kg   2/2.5 bath  P4   Hep 2800  LUA AVF - Hect 2 ug - no esa/ Fe - last hgb 14.2 4/12 38% sat Ca/P ok iPTH 362 - recent antibiotics: 1.75 gm Lucianne Lei 4/14 and 2 gm Tressie Ellis 4/14 prior to admission  Assessment: 1. MSSA bacteremia / infected AVF and stent - sp AVF/ stent resection on 4/16, temp cath placed at same time. Blood cx from 4/18 is negative at 24 hrs.  Will ask [facility] to put in tunneled HD cath Friday or Sat.   2. ESRD- TTS using temp cath for now 3. Vol excess - improving, up 2kg now 4. Anemia- hgb 12.9 >10.9 >10.8 follow  Trend.- off ESA last dose was 75 on 4/3 -  5. Metabolic bone disease-  phos 2.5>1.9   Ca. Corrected 9.9 on hectorol/ binders/sensipar; holding  calcium acetate with low phos / fu trend   6. DM - per primary 7. Nutrition-renal carb mod diet - vits/nepro 8. Hx afib/aflutter- NSR now - on amiodarone and chronic coumadin - /per admit  9. Bipolar / depression - per admit  restart home seroquel    Plan - HD today, plan new tunneled HD cath next day or two as above.    Kelly Splinter MD [employer] pgr [phone]   11/12/2016, 12:13 PM    Labs: Basic Metabolic Panel:  Recent Labs Lab 11/10/16 0247 11/11/16 0440 11/12/16 0711  NA 134* 134*  134*  K 3.7 3.8 4.0  CL 100* 98* 99*  CO2 24 26 24   GLUCOSE 118* 101* 99  BUN 48* 25* 39*  CREATININE 10.60* 7.61* 9.58*  CALCIUM 8.2* 7.9* 7.7*  PHOS 1.9* 1.9* 3.2   Liver Function Tests:  Recent Labs Lab 11/08/16 0000  11/10/16 0247 11/11/16 0440 11/12/16 0711  AST 17  --   --   --   --   ALT 12*  --   --   --   --   ALKPHOS 44  --   --   --   --   BILITOT 0.8  --   --   --   --   PROT 7.4  --   --   --   --   ALBUMIN 3.3*  < > 2.6* 2.6* 2.5*  < > = values in this interval not displayed.CBC:  Recent Labs Lab 11/08/16 0000 11/09/16 0316 11/10/16 0247 11/11/16 0440 11/12/16 0711  WBC 6.1 5.2 4.9 3.9* 4.8  NEUTROABS 4.7  --   --   --   --  HGB 12.9* 10.9* 10.8* 10.4* 10.5*  HCT 40.0 33.8* 33.5* 32.2* 32.4*  MCV 93.0 91.1 90.1 89.7 90.0  PLT 147* 152 155 172 221   Cardiac Enzymes:  Recent Labs Lab 11/08/16 0753  TROPONINI 0.03*   CBG:  Recent Labs Lab 11/11/16 1213 11/11/16 1754 11/11/16 2118 11/12/16 0457 11/12/16 1147  GLUCAP 136* 159* 129* 109* 132*    Studies/Results: No results found. Medications: . sodium chloride 10 mL/hr at 11/09/16 1622  .  ceFAZolin (ANCEF) IV     . amiodarone  200 mg Oral Daily  . atorvastatin  40 mg Oral q1800  . cinacalcet  30 mg Oral QPM  . doxercalciferol  2 mcg Intravenous Q T,Th,Sa-HD  . feeding supplement (NEPRO CARB STEADY)  237 mL Oral BID BM  . insulin aspart  0-5 Units Subcutaneous QHS  . insulin aspart  0-9 Units Subcutaneous TID WC  . multivitamin  1 tablet Oral QHS  . pantoprazole  40 mg Oral BID AC  . QUEtiapine  25 mg Oral BID  . sodium chloride flush  3 mL Intravenous Q12H

## 2016-11-13 ENCOUNTER — Inpatient Hospital Stay (HOSPITAL_COMMUNITY): Payer: Medicare Other | Admitting: Certified Registered Nurse Anesthetist

## 2016-11-13 ENCOUNTER — Inpatient Hospital Stay (HOSPITAL_COMMUNITY): Payer: Medicare Other

## 2016-11-13 ENCOUNTER — Encounter (HOSPITAL_COMMUNITY)
Admission: EM | Disposition: A | Discharge status: Home Health | Payer: Self-pay | Source: Home / Self Care | Attending: Family Medicine

## 2016-11-13 ENCOUNTER — Other Ambulatory Visit: Payer: Self-pay | Admitting: Internal Medicine

## 2016-11-13 ENCOUNTER — Encounter (HOSPITAL_COMMUNITY): Payer: Self-pay | Admitting: Certified Registered Nurse Anesthetist

## 2016-11-13 DIAGNOSIS — R7881 Bacteremia: Secondary | ICD-10-CM

## 2016-11-13 HISTORY — PX: EXCHANGE OF A DIALYSIS CATHETER: SHX5818

## 2016-11-13 LAB — BASIC METABOLIC PANEL
Anion gap: 14 (ref 5–15)
BUN: 20 mg/dL (ref 6–20)
CALCIUM: 8 mg/dL — AB (ref 8.9–10.3)
CHLORIDE: 93 mmol/L — AB (ref 101–111)
CO2: 26 mmol/L (ref 22–32)
CREATININE: 6.76 mg/dL — AB (ref 0.61–1.24)
GFR calc Af Amer: 9 mL/min — ABNORMAL LOW (ref >60)
GFR calc non Af Amer: 8 mL/min — ABNORMAL LOW (ref >60)
Glucose, Bld: 112 mg/dL — ABNORMAL HIGH (ref 65–99)
Potassium: 4.1 mmol/L (ref 3.5–5.1)
SODIUM: 133 mmol/L — AB (ref 135–145)

## 2016-11-13 LAB — GLUCOSE, CAPILLARY
GLUCOSE-CAPILLARY: 104 mg/dL — AB (ref 65–99)
Glucose-Capillary: 97 mg/dL (ref 65–99)
Glucose-Capillary: 92 mg/dL (ref 65–99)
Glucose-Capillary: 116 mg/dL — ABNORMAL HIGH (ref 65–99)
Glucose-Capillary: 166 mg/dL — ABNORMAL HIGH (ref 65–99)
Glucose-Capillary: 138 mg/dL — ABNORMAL HIGH (ref 65–99)

## 2016-11-13 LAB — PROTIME-INR
INR: 1.29
Prothrombin Time: 16.1 seconds — ABNORMAL HIGH (ref 11.4–15.2)

## 2016-11-13 LAB — CULTURE, BLOOD (ROUTINE X 2)
Special Requests: ADEQUATE
Special Requests: ADEQUATE

## 2016-11-13 SURGERY — EXCHANGE OF A DIALYSIS CATHETER
Anesthesia: Monitor Anesthesia Care | Site: Neck | Laterality: Left

## 2016-11-13 MED ORDER — PROMETHAZINE HCL 25 MG/ML IJ SOLN
6.2500 mg | INTRAMUSCULAR | Status: DC | PRN
Start: 2016-11-13 — End: 2016-11-13

## 2016-11-13 MED ORDER — LIDOCAINE HCL (PF) 1 % IJ SOLN
INTRAMUSCULAR | Status: DC | PRN
  Administered 2016-11-13: 30 mL

## 2016-11-13 MED ORDER — WARFARIN SODIUM 5 MG PO TABS
5.0000 mg | ORAL_TABLET | Freq: Once | ORAL | Status: AC
  Administered 2016-11-13: 5 mg via ORAL
  Filled 2016-11-13: qty 1

## 2016-11-13 MED ORDER — LIDOCAINE HCL 1 % IJ SOLN
INTRAMUSCULAR | Status: AC
  Filled 2016-11-13: qty 40

## 2016-11-13 MED ORDER — WARFARIN - PHARMACIST DOSING INPATIENT: Freq: Every day | Status: DC

## 2016-11-13 MED ORDER — IOPAMIDOL (ISOVUE-300) INJECTION 61%
INTRAVENOUS | Status: AC
  Filled 2016-11-13: qty 50

## 2016-11-13 MED ORDER — HEPARIN SODIUM (PORCINE) 1000 UNIT/ML IJ SOLN
INTRAMUSCULAR | Status: DC | PRN
  Administered 2016-11-13: 2.8 [IU]

## 2016-11-13 MED ORDER — MIDAZOLAM HCL 2 MG/2ML IJ SOLN
INTRAMUSCULAR | Status: AC
  Filled 2016-11-13: qty 2

## 2016-11-13 MED ORDER — HYDROMORPHONE HCL 1 MG/ML IJ SOLN: 0.2500 mg | INTRAMUSCULAR | Status: DC | PRN

## 2016-11-13 MED ORDER — HEPARIN SODIUM (PORCINE) 1000 UNIT/ML IJ SOLN
INTRAMUSCULAR | Status: AC
  Filled 2016-11-13: qty 1

## 2016-11-13 MED ORDER — PROPOFOL 500 MG/50ML IV EMUL
INTRAVENOUS | Status: DC | PRN
Start: 2016-11-13 — End: 2016-11-13
  Administered 2016-11-13: 75 ug/kg/min via INTRAVENOUS

## 2016-11-13 MED ORDER — 0.9 % SODIUM CHLORIDE (POUR BTL) OPTIME
TOPICAL | Status: DC | PRN
  Administered 2016-11-13: 1000 mL

## 2016-11-13 MED ORDER — OXYCODONE HCL 5 MG PO TABS: 5.0000 mg | ORAL_TABLET | Freq: Once | ORAL | Status: DC | PRN

## 2016-11-13 MED ORDER — SODIUM CHLORIDE 0.9 % IV SOLN
INTRAVENOUS | Status: DC | PRN
  Administered 2016-11-13: 500 mL

## 2016-11-13 MED ORDER — ONDANSETRON HCL 4 MG/2ML IJ SOLN
INTRAMUSCULAR | Status: DC | PRN
  Administered 2016-11-13: 4 mg via INTRAVENOUS

## 2016-11-13 MED ORDER — LIDOCAINE 2% (20 MG/ML) 5 ML SYRINGE
INTRAMUSCULAR | Status: DC | PRN
Start: 2016-11-13 — End: 2016-11-13
  Administered 2016-11-13: 40 mg via INTRAVENOUS

## 2016-11-13 MED ORDER — FENTANYL CITRATE (PF) 250 MCG/5ML IJ SOLN
INTRAMUSCULAR | Status: AC
  Filled 2016-11-13: qty 5

## 2016-11-13 MED ORDER — MIDAZOLAM HCL 5 MG/5ML IJ SOLN
INTRAMUSCULAR | Status: DC | PRN
  Administered 2016-11-13: 1 mg via INTRAVENOUS
  Administered 2016-11-13: 0.5 mg via INTRAVENOUS

## 2016-11-13 MED ORDER — OXYCODONE HCL 5 MG/5ML PO SOLN: 5.0000 mg | Freq: Once | ORAL | Status: DC | PRN

## 2016-11-13 MED ORDER — ONDANSETRON HCL 4 MG/2ML IJ SOLN
INTRAMUSCULAR | Status: AC
  Filled 2016-11-13: qty 2

## 2016-11-13 SURGICAL SUPPLY — 46 items
ADH SKN CLS APL DERMABOND .7 (GAUZE/BANDAGES/DRESSINGS) ×1
BAG DECANTER FOR FLEXI CONT (MISCELLANEOUS) ×2 IMPLANT
BIOPATCH BLUE 3/4IN DISK W/1.5 (GAUZE/BANDAGES/DRESSINGS) ×1 IMPLANT
BIOPATCH RED 1 DISK 7.0 (GAUZE/BANDAGES/DRESSINGS) ×2 IMPLANT
CATH PALINDROME RT-P 15FX19CM (CATHETERS) IMPLANT
CATH PALINDROME RT-P 15FX23CM (CATHETERS) IMPLANT
CATH PALINDROME RT-P 15FX28CM (CATHETERS) ×1 IMPLANT
CATH PALINDROME RT-P 15FX55CM (CATHETERS) IMPLANT
CHLORAPREP W/TINT 26ML (MISCELLANEOUS) ×2 IMPLANT
COVER PROBE W GEL 5X96 (DRAPES) IMPLANT
COVER SURGICAL LIGHT HANDLE (MISCELLANEOUS) ×2 IMPLANT
DECANTER SPIKE VIAL GLASS SM (MISCELLANEOUS) ×2 IMPLANT
DERMABOND ADVANCED (GAUZE/BANDAGES/DRESSINGS) ×1
DERMABOND ADVANCED .7 DNX12 (GAUZE/BANDAGES/DRESSINGS) IMPLANT
DRAPE C-ARM 42X72 X-RAY (DRAPES) ×2 IMPLANT
DRAPE CHEST BREAST 15X10 FENES (DRAPES) ×2 IMPLANT
GAUZE SPONGE 2X2 8PLY STRL LF (GAUZE/BANDAGES/DRESSINGS) IMPLANT
GAUZE SPONGE 4X4 16PLY XRAY LF (GAUZE/BANDAGES/DRESSINGS) ×2 IMPLANT
GLOVE BIO SURGEON STRL SZ 6.5 (GLOVE) ×2 IMPLANT
GLOVE BIO SURGEON STRL SZ7.5 (GLOVE) ×2 IMPLANT
GLOVE BIOGEL PI IND STRL 6.5 (GLOVE) IMPLANT
GLOVE BIOGEL PI IND STRL 7.0 (GLOVE) IMPLANT
GLOVE BIOGEL PI INDICATOR 6.5 (GLOVE) ×1
GLOVE BIOGEL PI INDICATOR 7.0 (GLOVE) ×2
GOWN STRL REUS W/ TWL LRG LVL3 (GOWN DISPOSABLE) ×3 IMPLANT
GOWN STRL REUS W/TWL LRG LVL3 (GOWN DISPOSABLE) ×6
KIT BASIN OR (CUSTOM PROCEDURE TRAY) ×2 IMPLANT
KIT ROOM TURNOVER OR (KITS) ×2 IMPLANT
NDL 18GX1X1/2 (RX/OR ONLY) (NEEDLE) ×1 IMPLANT
NDL HYPO 25GX1X1/2 BEV (NEEDLE) ×1 IMPLANT
NEEDLE 18GX1X1/2 (RX/OR ONLY) (NEEDLE) ×2 IMPLANT
NEEDLE HYPO 25GX1X1/2 BEV (NEEDLE) ×2 IMPLANT
NS IRRIG 1000ML POUR BTL (IV SOLUTION) ×2 IMPLANT
PACK SURGICAL SETUP 50X90 (CUSTOM PROCEDURE TRAY) ×2 IMPLANT
PAD ARMBOARD 7.5X6 YLW CONV (MISCELLANEOUS) ×4 IMPLANT
SPONGE GAUZE 2X2 STER 10/PKG (GAUZE/BANDAGES/DRESSINGS) ×1
SUT ETHILON 3 0 PS 1 (SUTURE) ×2 IMPLANT
SUT SILK 0 FSL (SUTURE) IMPLANT
SUT VICRYL 4-0 PS2 18IN ABS (SUTURE) ×2 IMPLANT
SYR 10ML LL (SYRINGE) ×2 IMPLANT
SYR 20CC LL (SYRINGE) ×4 IMPLANT
SYR 30ML LL (SYRINGE) IMPLANT
SYR 5ML LL (SYRINGE) ×4 IMPLANT
SYR CONTROL 10ML LL (SYRINGE) ×2 IMPLANT
TAPE CLOTH SURG 4X10 WHT LF (GAUZE/BANDAGES/DRESSINGS) ×1 IMPLANT
WATER STERILE IRR 1000ML POUR (IV SOLUTION) ×2 IMPLANT

## 2016-11-13 NOTE — Anesthesia Postprocedure Evaluation (Signed)
Anesthesia Post Note  Patient: COAL NEARHOOD  Procedure(s) Performed: Procedure(s) (LRB): EXCHANGE OF A DIALYSIS CATHETER (Left)  Patient location during evaluation: PACU Anesthesia Type: MAC Level of consciousness: awake and alert Pain management: pain level controlled Vital Signs Assessment: post-procedure vital signs reviewed and stable Respiratory status: spontaneous breathing, nonlabored ventilation and respiratory function stable Cardiovascular status: stable and blood pressure returned to baseline Anesthetic complications: no       Last Vitals:  Vitals:   11/13/16 1228 11/13/16 1245  BP: (!) 148/95 (!) 141/101  Pulse:  72  Resp: 14 11  Temp:  36.6 C    Last Pain:  Vitals:   11/13/16 0900  TempSrc: Oral  PainSc:                  Lynda Rainwater

## 2016-11-13 NOTE — Care Management Important Message (Signed)
Important Message  Patient Details  Name: Oscar Castillo MRN: [id number] Date of Birth: November 13, 1950   Medicare Important Message Given:  Yes    [name] 11/13/2016, 3:21 PM

## 2016-11-13 NOTE — Interval H&P Note (Signed)
History and Physical Interval Note:  11/13/2016 11:27 AM  Oscar Castillo  has presented today for surgery, with the diagnosis of End stage renal disease N18.6  The various methods of treatment have been discussed with the patient and family. After consideration of risks, benefits and other options for treatment, the patient has consented to  Procedure(s): EXCHANGE OF A DIALYSIS CATHETER (Left) as a surgical intervention .  The patient's history has been reviewed, patient examined, no change in status, stable for surgery.  I have reviewed the patient's chart and labs.  Questions were answered to the patient's satisfaction.     Ruta Hinds

## 2016-11-13 NOTE — Anesthesia Procedure Notes (Signed)
Procedure Name: MAC Date/Time: 11/13/2016 11:24 AM Performed by: Candis Shine Pre-anesthesia Checklist: Patient identified, Emergency Drugs available, Suction available, Patient being monitored and Timeout performed Patient Re-evaluated:Patient Re-evaluated prior to inductionOxygen Delivery Method: Simple face mask Dental Injury: Teeth and Oropharynx as per pre-operative assessment

## 2016-11-13 NOTE — Progress Notes (Signed)
[facility]  PROGRESS NOTE  [name]:[id number] DOB: Aug 27, 1950 DOA: 11/07/2016 PCP: [name], MD   Brief HPI:    66 y.o.malewith a past medical history significant for ESRD on HD MWF, CHF EF 50%, Afib on amiodarone and warfarin, NIDDM and bilateral BKAwho presents with fever, malaise.  He was admitted with sepsis thought secondary to infected left arm fistula/stent.  He has staph aureus bacteremia.  ID consulted.  VVS and nephrology following.  Pt to OR 4/16 for removal of stents and placement of temp dialysis catheter.       Subjective   Patient seen and examined, Denies pain or shortness of breath. Patient going for exchange of dialysis catheter.   Assessment/Plan:     1. Sepsis- secondary to infected AVF, he was taken to or on 4/16 for AVF removal and placement of temporary dialysis catheter. He is currently on IV cefazolin per ID. Lactic acid on 11/08/2016 was 1.2. 2. Staph aureus bacteremia- Blood cultures grew methicillin sensitive staph aureus, treated with cefazolin as above. Tunneled catheter will be placed today, repeat blood cultures sent on 417 is negative to date. Blood cultures from 416 positive for S hominis, likely contaminant. 3. ESRD on dialysis- patient gets dialysis Tuesday Thursday and Saturday. Nephrology following 4. Diabetes mellitus - blood was controlled, continue sliding scale insulin with NovoLog. 5. Hypertension- blood pressure stable. 6. History of atrial fibrillation/atrial flutter- heart rate is controlled, continue amiodarone. Coumadin was held, after AVF removal on 11/09/2016. We'll restart Coumadin per pharmacy consultation today. 7. Bipolar disorder/depression-continue Seroquel.     DVT prophylaxis: Warfarin  Code Status: Full code  Family Communication: No family at bedside  Disposition Plan: Home versus skilled nursing facility   Consultants:  Vascular surgery  Nephrology  Infectious disease  Procedures:  4/16  stent removal and temp dialysis catheter placement  Continuous infusions . sodium chloride 10 mL/hr at 11/09/16 1622  .  ceFAZolin (ANCEF) IV        Antibiotics:   Anti-infectives    Start     Dose/Rate Route Frequency Ordered Stop   11/10/16 1200  ceFAZolin (ANCEF) IVPB 2g/100 mL premix     2 g 200 mL/hr over 30 Minutes Intravenous Every T-Th-Sa (Hemodialysis) 11/08/16 1824     11/09/16 1200  vancomycin (VANCOCIN) IVPB 750 mg/150 ml premix  Status:  Discontinued     750 mg 150 mL/hr over 60 Minutes Intravenous Every M-W-F (Hemodialysis) 11/08/16 0421 11/08/16 1758   11/09/16 0000  vancomycin (VANCOCIN) IVPB 1000 mg/200 mL premix  Status:  Discontinued     1,000 mg 200 mL/hr over 60 Minutes Intravenous To Surgery 11/08/16 1949 11/08/16 1959   11/08/16 1915  ceFAZolin (ANCEF) IVPB 2g/100 mL premix     2 g 200 mL/hr over 30 Minutes Intravenous  Once 11/08/16 1824 11/08/16 2055   11/08/16 1000  piperacillin-tazobactam (ZOSYN) IVPB 3.375 g  Status:  Discontinued     3.375 g 12.5 mL/hr over 240 Minutes Intravenous Every 12 hours 11/08/16 0421 11/08/16 1758   11/08/16 0500  vancomycin (VANCOCIN) 500 mg in sodium chloride 0.9 % 100 mL IVPB     500 mg 100 mL/hr over 60 Minutes Intravenous  Once 11/08/16 0421 11/08/16 0637   11/08/16 0030  piperacillin-tazobactam (ZOSYN) IVPB 3.375 g     3.375 g 100 mL/hr over 30 Minutes Intravenous  Once 11/08/16 0017 11/08/16 0109   11/08/16 0030  vancomycin (VANCOCIN) 1,500 mg in sodium chloride 0.9 % 500 mL IVPB  Status:  Discontinued     1,500 mg 250 mL/hr over 120 Minutes Intravenous  Once 11/08/16 0018 11/08/16 0027   11/08/16 0030  vancomycin (VANCOCIN) IVPB 1000 mg/200 mL premix     1,000 mg 200 mL/hr over 60 Minutes Intravenous  Once 11/08/16 0028 11/08/16 0142   11/08/16 0015  vancomycin (VANCOCIN) IVPB 1000 mg/200 mL premix  Status:  Discontinued     1,000 mg 200 mL/hr over 60 Minutes Intravenous  Once 11/08/16 0014 11/08/16 0017        Objective   Vitals:   11/13/16 1213 11/13/16 1228 11/13/16 1245 11/13/16 1336  BP: (!) 157/96 (!) 148/95 (!) 141/101 (!) 177/98  Pulse: 77  72 73  Resp: 13 14 11 13   Temp: 97.2 F (36.2 C)  97.8 F (36.6 C) 97.4 F (36.3 C)  TempSrc:    Oral  SpO2: 93%  100% 100%  Weight:      Height:        Intake/Output Summary (Last 24 hours) at 11/13/16 1357 Last data filed at 11/13/16 1216  Gross per 24 hour  Intake              613 ml  Output                0 ml  Net              613 ml   Filed Weights   11/12/16 0705 11/12/16 1111 11/12/16 2127  Weight: 88.6 kg (195 lb 5.2 oz) 85.4 kg (188 lb 4.4 oz) 86 kg (189 lb 9.5 oz)     Physical Examination:  Physical Exam: Eyes: No icterus, extraocular muscles intact  Mouth: Oral mucosa is moist, no lesions on palate,  Neck: Supple, no deformities, masses, or tenderness Lungs: Normal respiratory effort, bilateral clear to auscultation, no crackles or wheezes.  Heart: Regular rate and rhythm, S1 and S2 normal, no murmurs, rubs auscultated Abdomen: BS normoactive,soft,nondistended,non-tender to palpation,no organomegaly Extremities: Status post bilateral BKA Neuro : Alert and oriented to time, place and person, No focal deficits Skin: Left arm edema noted in the left antecubital fossa , sutures in place at  site of aVF    Data Reviewed: I have personally reviewed following labs and imaging studies  CBG:  Recent Labs Lab 11/12/16 2131 11/13/16 0307 11/13/16 0758 11/13/16 1100 11/13/16 1215  GLUCAP 147* 97 104* 92 116*    CBC:  Recent Labs Lab 11/08/16 0000 11/09/16 0316 11/10/16 0247 11/11/16 0440 11/12/16 0711  WBC 6.1 5.2 4.9 3.9* 4.8  NEUTROABS 4.7  --   --   --   --   HGB 12.9* 10.9* 10.8* 10.4* 10.5*  HCT 40.0 33.8* 33.5* 32.2* 32.4*  MCV 93.0 91.1 90.1 89.7 90.0  PLT 147* 152 155 172 782    Basic Metabolic Panel:  Recent Labs Lab 11/09/16 0316 11/10/16 0247 11/11/16 0440 11/12/16 0711  11/13/16 0430  NA 136 134* 134* 134* 133*  K 4.5 3.7 3.8 4.0 4.1  CL 101 100* 98* 99* 93*  CO2 23 24 26 24 26   GLUCOSE 111* 118* 101* 99 112*  BUN 43* 48* 25* 39* 20  CREATININE 9.36* 10.60* 7.61* 9.58* 6.76*  CALCIUM 8.3* 8.2* 7.9* 7.7* 8.0*  PHOS 2.5 1.9* 1.9* 3.2  --     Recent Results (from the past 240 hour(s))  Culture, blood (Routine x 2)     Status: Abnormal   Collection Time: 11/08/16 12:00 AM  Result Value Ref  Range Status   Specimen Description BLOOD RIGHT ARM  Final   Special Requests   Final    BOTTLES DRAWN AEROBIC AND ANAEROBIC Blood Culture adequate volume   Culture  Setup Time   Final    GRAM POSITIVE COCCI IN CLUSTERS ANAEROBIC BOTTLE ONLY CRITICAL VALUE NOTED.  VALUE IS CONSISTENT WITH PREVIOUSLY REPORTED AND CALLED VALUE.    Culture (A)  Final    STAPHYLOCOCCUS AUREUS SUSCEPTIBILITIES PERFORMED ON PREVIOUS CULTURE WITHIN THE LAST 5 DAYS.    Report Status 11/13/2016 FINAL  Final  Culture, blood (Routine x 2)     Status: Abnormal   Collection Time: 11/08/16 12:12 AM  Result Value Ref Range Status   Specimen Description BLOOD RIGHT HAND  Final   Special Requests IN PEDIATRIC BOTTLE Blood Culture adequate volume  Final   Culture  Setup Time   Final    GRAM POSITIVE COCCI IN CLUSTERS IN PEDIATRIC BOTTLE CRITICAL RESULT CALLED TO, READ BACK BY AND VERIFIED WITH: T STONE, PHARM, 11/08/16 AT 1616 BY J FUDESCO    Culture STAPHYLOCOCCUS AUREUS (A)  Final   Report Status 11/10/2016 FINAL  Final   Organism ID, Bacteria STAPHYLOCOCCUS AUREUS  Final      Susceptibility   Staphylococcus aureus - MIC*    CIPROFLOXACIN <=0.5 SENSITIVE Sensitive     ERYTHROMYCIN >=8 RESISTANT Resistant     GENTAMICIN <=0.5 SENSITIVE Sensitive     OXACILLIN 0.5 SENSITIVE Sensitive     TETRACYCLINE <=1 SENSITIVE Sensitive     VANCOMYCIN <=0.5 SENSITIVE Sensitive     TRIMETH/SULFA <=10 SENSITIVE Sensitive     CLINDAMYCIN <=0.25 SENSITIVE Sensitive     RIFAMPIN <=0.5 SENSITIVE  Sensitive     Inducible Clindamycin NEGATIVE Sensitive     * STAPHYLOCOCCUS AUREUS  Blood Culture ID Panel (Reflexed)     Status: Abnormal   Collection Time: 11/08/16 12:12 AM  Result Value Ref Range Status   Enterococcus species NOT DETECTED NOT DETECTED Final   Listeria monocytogenes NOT DETECTED NOT DETECTED Final   Staphylococcus species DETECTED (A) NOT DETECTED Final    Comment: CRITICAL RESULT CALLED TO, READ BACK BY AND VERIFIED WITH: T. STONE, PHARM, 11/08/16 AT 1616 BY J FUDESCO    Staphylococcus aureus DETECTED (A) NOT DETECTED Final    Comment: Methicillin (oxacillin) susceptible Staphylococcus aureus (MSSA). Preferred therapy is anti staphylococcal beta lactam antibiotic (Cefazolin or Nafcillin), unless clinically contraindicated. CRITICAL RESULT CALLED TO, READ BACK BY AND VERIFIED WITH: T. STONE, PHARM, 11/08/16 AT 1616 BY J FUDESCO    Methicillin resistance NOT DETECTED NOT DETECTED Final   Streptococcus species NOT DETECTED NOT DETECTED Final   Streptococcus agalactiae NOT DETECTED NOT DETECTED Final   Streptococcus pneumoniae NOT DETECTED NOT DETECTED Final   Streptococcus pyogenes NOT DETECTED NOT DETECTED Final   Acinetobacter baumannii NOT DETECTED NOT DETECTED Final   Enterobacteriaceae species NOT DETECTED NOT DETECTED Final   Enterobacter cloacae complex NOT DETECTED NOT DETECTED Final   Escherichia coli NOT DETECTED NOT DETECTED Final   Klebsiella oxytoca NOT DETECTED NOT DETECTED Final   Klebsiella pneumoniae NOT DETECTED NOT DETECTED Final   Proteus species NOT DETECTED NOT DETECTED Final   Serratia marcescens NOT DETECTED NOT DETECTED Final   Haemophilus influenzae NOT DETECTED NOT DETECTED Final   Neisseria meningitidis NOT DETECTED NOT DETECTED Final   Pseudomonas aeruginosa NOT DETECTED NOT DETECTED Final   Candida albicans NOT DETECTED NOT DETECTED Final   Candida glabrata NOT DETECTED NOT DETECTED Final   Candida  krusei NOT DETECTED NOT DETECTED Final    Candida parapsilosis NOT DETECTED NOT DETECTED Final   Candida tropicalis NOT DETECTED NOT DETECTED Final  MRSA PCR Screening     Status: None   Collection Time: 11/08/16  5:49 AM  Result Value Ref Range Status   MRSA by PCR NEGATIVE NEGATIVE Final    Comment:        The GeneXpert MRSA Assay (FDA approved for NASAL specimens only), is one component of a comprehensive MRSA colonization surveillance program. It is not intended to diagnose MRSA infection nor to guide or monitor treatment for MRSA infections.   Aerobic Culture (superficial specimen)     Status: None   Collection Time: 11/09/16  7:05 PM  Result Value Ref Range Status   Specimen Description WOUND LEFT ARM  Final   Special Requests   Final    SWAB OF FLUID FROM STENT GRAFT LUMEN PT ON ANCEF, VANC   Gram Stain   Final    FEW WBC PRESENT,BOTH PMN AND MONONUCLEAR NO ORGANISMS SEEN    Culture FEW STAPHYLOCOCCUS AUREUS  Final   Report Status 11/12/2016 FINAL  Final   Organism ID, Bacteria STAPHYLOCOCCUS AUREUS  Final      Susceptibility   Staphylococcus aureus - MIC*    CIPROFLOXACIN <=0.5 SENSITIVE Sensitive     ERYTHROMYCIN >=8 RESISTANT Resistant     GENTAMICIN <=0.5 SENSITIVE Sensitive     OXACILLIN 0.5 SENSITIVE Sensitive     TETRACYCLINE <=1 SENSITIVE Sensitive     VANCOMYCIN <=0.5 SENSITIVE Sensitive     TRIMETH/SULFA <=10 SENSITIVE Sensitive     CLINDAMYCIN <=0.25 SENSITIVE Sensitive     RIFAMPIN <=0.5 SENSITIVE Sensitive     Inducible Clindamycin NEGATIVE Sensitive     * FEW STAPHYLOCOCCUS AUREUS  Culture, blood (Routine X 2) w Reflex to ID Panel     Status: Abnormal   Collection Time: 11/10/16  8:41 AM  Result Value Ref Range Status   Specimen Description BLOOD RIGHT ARM  Final   Special Requests IN PEDIATRIC BOTTLE Blood Culture adequate volume  Final   Culture  Setup Time   Final    GRAM POSITIVE COCCI IN CLUSTERS IN PEDIATRIC BOTTLE CRITICAL VALUE NOTED.  VALUE IS CONSISTENT WITH PREVIOUSLY  REPORTED AND CALLED VALUE.    Culture (A)  Final    STAPHYLOCOCCUS HOMINIS THE SIGNIFICANCE OF ISOLATING THIS ORGANISM FROM A SINGLE SET OF BLOOD CULTURES WHEN MULTIPLE SETS ARE DRAWN IS UNCERTAIN. PLEASE NOTIFY THE MICROBIOLOGY DEPARTMENT WITHIN ONE WEEK IF SPECIATION AND SENSITIVITIES ARE REQUIRED.    Report Status 11/13/2016 FINAL  Final  Culture, blood (single)     Status: None (Preliminary result)   Collection Time: 11/11/16 11:42 AM  Result Value Ref Range Status   Specimen Description BLOOD RIGHT ARM  Final   Special Requests   Final    BOTTLES DRAWN AEROBIC AND ANAEROBIC Blood Culture adequate volume   Culture NO GROWTH < 24 HOURS  Final   Report Status PENDING  Incomplete     Liver Function Tests:  Recent Labs Lab 11/08/16 0000 11/09/16 0316 11/10/16 0247 11/11/16 0440 11/12/16 0711  AST 17  --   --   --   --   ALT 12*  --   --   --   --   ALKPHOS 44  --   --   --   --   BILITOT 0.8  --   --   --   --   PROT  7.4  --   --   --   --   ALBUMIN 3.3* 2.7* 2.6* 2.6* 2.5*   No results for input(s): LIPASE, AMYLASE in the last 168 hours. No results for input(s): AMMONIA in the last 168 hours.  Cardiac Enzymes:  Recent Labs Lab 11/08/16 0753  TROPONINI 0.03*      Studies: Dg Chest Port 1 View  Result Date: 11/13/2016 CLINICAL DATA:  Status post central line placement for dialysis today. EXAM: PORTABLE CHEST 1 VIEW COMPARISON:  Single-view of the chest 11/09/2016. FINDINGS: Double lumen left IJ approach central venous catheter is in place. Tip of the catheter projects deep in the right atrium. No pneumothorax. Lungs are clear. Cardiomegaly noted. IMPRESSION: Template dialysis catheter projects deep in the right atrium. Negative for pneumothorax. Cardiomegaly without edema. Electronically Signed   By: Inge Rise M.D.   On: 11/13/2016 13:00   Dg Fluoro Guide Cv Line-no Report  Result Date: 11/13/2016 Fluoroscopy was utilized by the requesting physician.  No  radiographic interpretation.    Scheduled Meds: . amiodarone  200 mg Oral Daily  . atorvastatin  40 mg Oral q1800  . cinacalcet  30 mg Oral QPM  . doxercalciferol  2 mcg Intravenous Q T,Th,Sa-HD  . feeding supplement (NEPRO CARB STEADY)  237 mL Oral BID BM  . insulin aspart  0-5 Units Subcutaneous QHS  . insulin aspart  0-9 Units Subcutaneous TID WC  . multivitamin  1 tablet Oral QHS  . pantoprazole  40 mg Oral BID AC  . QUEtiapine  25 mg Oral BID  . sodium chloride flush  3 mL Intravenous Q12H      Time spent: 25 min  [facility] Pager [phone]. If 7PM-7AM, please contact night-coverage at [email or web address], Office  [phone]  password [email or web address] 11/13/2016, 1:57 PM  LOS: 5 days

## 2016-11-13 NOTE — Op Note (Signed)
Procedure: Insertion of Palindrome catheter 28 cm  Preoperative diagnosis: End-stage renal disease  Postoperative diagnosis: Same  Anesthesia: Local with IV sedation  Operative findings: 28 cm Palindrome catheter left internal jugular vein  Operative details: After obtaining informed consent, the patient was taken to the operating room. The patient was placed in supine position on the operating room table. After adequate sedation the patient's entire neck and chest were prepped and draped in usual sterile fashion. The patient was placed in Trendelenburg position. Local anesthesia was infiltrated over the left jugular vein.  The patient had a preexisting left side trialysis catheter.  The distal tip of the catheter was cannulated with an 0.035 J-tipped guidewire and threaded into the right internal jugular vein and into the superior vena cava followed by the inferior vena cava under fluoroscopic guidance.   The stitch at the skin exit site was removed and the distal catheter removed with gentle traction and passed off the field.  Next sequential 12 and 14 dilators were placed over the guidewire into the right atrium.  A 16 French dilator with a peel-away sheath was then placed over the guidewire into the right atrium.   The guidewire and dilator were removed. A 28 cm Palindrome catheter was then placed through the peel away sheath into the right atrium.  The catheter was then tunneled subcutaneously, cut to length, and the hub attached. The catheter was noted to flush and draw easily. The catheter was inspected under fluoroscopy and found with its tip to be in the right atrium without any kinks throughout its course. The catheter was sutured to the skin with nylon sutures. The neck insertion site was closed with Vicryl stitch. The catheter was then loaded with concentrated Heparin solution. A dry sterile dressing was applied.  The patient tolerated procedure well and there were no complications.  Instrument sponge and needle counts correct in the case. The patient was taken to the recovery room in stable condition. Chest x-ray will be obtained in the recovery room.  Ruta Hinds, MD

## 2016-11-13 NOTE — Anesthesia Preprocedure Evaluation (Signed)
Anesthesia Evaluation  Patient identified by MRN, date of birth, ID band Patient awake    Reviewed: Allergy & Precautions, NPO status , Patient's Chart, lab work & pertinent test results  Airway Mallampati: II  TM Distance: >3 FB Neck ROM: Full    Dental no notable dental hx.    Pulmonary neg pulmonary ROS,    Pulmonary exam normal breath sounds clear to auscultation       Cardiovascular hypertension, +CHF  Normal cardiovascular exam+ Valvular Problems/Murmurs MR  Rhythm:Regular Rate:Normal  Left ventricle: The cavity size was normal. There was mild   concentric hypertrophy. Systolic function was normal. The   estimated ejection fraction was in the range of 50% to 55%. Wall   motion was normal; there were no regional wall motion   abnormalities. The study was not technically sufficient to allow   evaluation of LV diastolic dysfunction due to atrial   fibrillation. - Ventricular septum: Septal motion showed moderate paradox. These   changes are consistent with intraventricular conduction delay. - Aortic valve: Severe diffuse thickening and calcification. Valve   mobility was mildly restricted. - Mitral valve: There was mild regurgitation.   Neuro/Psych Bipolar Disorder CVA    GI/Hepatic Neg liver ROS, GERD  ,  Endo/Other  diabetes  Renal/GU DialysisRenal disease  negative genitourinary   Musculoskeletal negative musculoskeletal ROS (+)   Abdominal   Peds negative pediatric ROS (+)  Hematology negative hematology ROS (+)   Anesthesia Other Findings   Reproductive/Obstetrics negative OB ROS                             Anesthesia Physical  Anesthesia Plan  ASA: IV  Anesthesia Plan: MAC   Post-op Pain Management:    Induction: Intravenous  Airway Management Planned: Simple Face Mask  Additional Equipment:   Intra-op Plan:   Post-operative Plan:   Informed Consent: I have  reviewed the patients History and Physical, chart, labs and discussed the procedure including the risks, benefits and alternatives for the proposed anesthesia with the patient or authorized representative who has indicated his/her understanding and acceptance.   Dental advisory given  Plan Discussed with: CRNA and Surgeon  Anesthesia Plan Comments:         Anesthesia Quick Evaluation

## 2016-11-13 NOTE — Progress Notes (Signed)
ANTICOAGULATION CONSULT NOTE - Initial Consult  Pharmacy Consult for warfarin Indication: atrial fibrillation  No Known Allergies  Patient Measurements: Height: 5\' 10"  (177.8 cm) Weight: 189 lb 9.5 oz (86 kg) IBW/kg (Calculated) : 73   Vital Signs: Temp: 97.4 F (36.3 C) (04/20 1336) Temp Source: Oral (04/20 1336) BP: 177/98 (04/20 1336) Pulse Rate: 73 (04/20 1336)  Labs:  Recent Labs  11/11/16 0440 11/12/16 0711 11/13/16 0430  HGB 10.4* 10.5*  --   HCT 32.2* 32.4*  --   PLT 172 221  --   LABPROT 20.7* 17.4* 16.1*  INR 1.75 1.41 1.29  CREATININE 7.61* 9.58* 6.76*    Estimated Creatinine Clearance: 11.2 mL/min (A) (by C-G formula based on SCr of 6.76 mg/dL (H)).  Assessment: 59 YOM admitted with bacteremia- warfarin was on hold due to need for procedures. Now s/p TDC placement and able to restart warfarin. Home dose 3.75mg  daily except 2.5mg  on Tuesdays, Fridays, and Sundays.  INR 1.29 today. Hgb 10.5, plts 221- no overt bleeding noted.  Patient with GIB in March 2018.  Goal of Therapy:  INR 2-3 Monitor platelets by anticoagulation protocol: Yes   Plan:  -warfarin 5mg  po x1 tonight -daily INR while here -CBC in the morning -follow closely for s/s bleeding  Carley Strickling D. Echo [place], PharmD, BCPS Clinical Pharmacist Pager: [phone] 11/13/2016 2:08 PM

## 2016-11-13 NOTE — H&P (View-Only) (Signed)
[facility]  Subjective  - feels ok   Objective 118/75 81 98 F (36.7 C) (Oral) 15 99%  Intake/Output Summary (Last 24 hours) at 11/12/16 0747 Last data filed at 11/12/16 0600  Gross per 24 hour  Intake              840 ml  Output                1 ml  Net              839 ml   Left upper extremity incision without drainage or erythema Still mild edema in hand arm  Assessment/Planning: s/p excision left arm AVF/infected stent, growing multidrug sensistive staph Antibiotics per ID Please call when ready for tunneled catheter  Ruta Hinds 11/12/2016 7:47 AM --  Laboratory Lab Results:  Recent Labs  11/11/16 0440 11/12/16 0711  WBC 3.9* 4.8  HGB 10.4* 10.5*  HCT 32.2* 32.4*  PLT 172 221   BMET  Recent Labs  11/10/16 0247 11/11/16 0440  NA 134* 134*  K 3.7 3.8  CL 100* 98*  CO2 24 26  GLUCOSE 118* 101*  BUN 48* 25*  CREATININE 10.60* 7.61*  CALCIUM 8.2* 7.9*    COAG Lab Results  Component Value Date   INR 1.41 11/12/2016   INR 1.75 11/11/2016   INR 1.81 11/10/2016   No results found for: PTT

## 2016-11-13 NOTE — Progress Notes (Signed)
Subjective:  Going to OR this am  Objective Vital signs in last 24 hours: Vitals:   11/12/16 2127 11/13/16 0547 11/13/16 0900 11/13/16 1213  BP: 125/75 (!) 154/91 (!) 156/92 (!) 157/96  Pulse: 78 74 72 77  Resp: 17 17 13 13   Temp: 98 F (36.7 C) 98 F (36.7 C) 97.8 F (36.6 C)   TempSrc: Oral Oral Oral   SpO2: 100% 100% 100% 93%  Weight: 86 kg (189 lb 9.5 oz)     Height:       Weight change: -0.8 kg (-1 lb 12.2 oz)  Physical Exam: General: alert  NAD , OX3 .  Lungs: CTA  nonlabored breathing  Heart:RRR no mur, or rub .  Abdomen: soft NT + BS Lower extremities: Bilateral BKA with no edema Dialysis Access: left upper AVF clean dry surgical site / L IJ Temp  HD cath   [facility]   83kg   2/2.5 bath  P4   Hep 2800  LUA AVF - Hect 2 ug - no esa/ Fe - last hgb 14.2 4/12 38% sat Ca/P ok iPTH 362 - recent antibiotics: 1.75 gm Lucianne Lei 4/14 and 2 gm Tressie Ellis 4/14 prior to admission  Assessment: 1. MSSA bacteremia w infected AVF and stent - sp AVF/ stent resection on 4/16, temp cath placed at same time. For new tunneled HD cath today. F/U +blood cx was not staph aureus but s. Hominis, so ID suspects this was a contaminant.   2. ESRD- TTS HD 3. Vol - better, up 3kg today 4. Anemia- hgb 12.9 >10.9 >10.8 follow  Trend.- off ESA last dose was 75 on 4/3 5. Metabolic bone disease-  phos 2.5>1.9   Ca. Corrected 9.9 on hectorol/ binders/sensipar; holding  calcium acetate with low phos / fu trend   6. DM - per primary 7. Nutrition-renal carb mod diet - vits/nepro 8. Hx afib/aflutter- NSR now - on amiodarone and chronic coumadin - /per admit  9. Bipolar / depression - per admit  restart home seroquel    Plan - new cath today, HD in am Sat and should be ok for dc after HD tomorrow. Have d/w primary MD.   Kelly Splinter MD [facility] pgr [phone]   11/13/2016, 12:21 PM    Labs: Basic Metabolic Panel:  Recent Labs Lab 11/10/16 0247  11/11/16 0440 11/12/16 0711 11/13/16 0430  NA 134* 134* 134* 133*  K 3.7 3.8 4.0 4.1  CL 100* 98* 99* 93*  CO2 24 26 24 26   GLUCOSE 118* 101* 99 112*  BUN 48* 25* 39* 20  CREATININE 10.60* 7.61* 9.58* 6.76*  CALCIUM 8.2* 7.9* 7.7* 8.0*  PHOS 1.9* 1.9* 3.2  --    Liver Function Tests:  Recent Labs Lab 11/08/16 0000  11/10/16 0247 11/11/16 0440 11/12/16 0711  AST 17  --   --   --   --   ALT 12*  --   --   --   --   ALKPHOS 44  --   --   --   --   BILITOT 0.8  --   --   --   --   PROT 7.4  --   --   --   --   ALBUMIN 3.3*  < > 2.6* 2.6* 2.5*  < > = values in this interval not displayed.CBC:  Recent Labs Lab 11/08/16 0000 11/09/16 0316 11/10/16 0247 11/11/16 0440 11/12/16 0711  WBC 6.1 5.2 4.9 3.9* 4.8  NEUTROABS 4.7  --   --   --   --   HGB 12.9* 10.9* 10.8* 10.4* 10.5*  HCT 40.0 33.8* 33.5* 32.2* 32.4*  MCV 93.0 91.1 90.1 89.7 90.0  PLT 147* 152 155 172 221   Cardiac Enzymes:  Recent Labs Lab 11/08/16 0753  TROPONINI 0.03*   CBG:  Recent Labs Lab 11/12/16 2131 11/13/16 0307 11/13/16 0758 11/13/16 1100 11/13/16 1215  GLUCAP 147* 97 104* 92 116*    Studies/Results: Dg Fluoro Guide Cv Line-no Report  Result Date: 11/13/2016 Fluoroscopy was utilized by the requesting physician.  No radiographic interpretation.   Medications: . sodium chloride 10 mL/hr at 11/09/16 1622  . [MAR Hold]  ceFAZolin (ANCEF) IV     . [MAR Hold] amiodarone  200 mg Oral Daily  . [MAR Hold] atorvastatin  40 mg Oral q1800  . [MAR Hold] cinacalcet  30 mg Oral QPM  . [MAR Hold] doxercalciferol  2 mcg Intravenous Q T,Th,Sa-HD  . [MAR Hold] feeding supplement (NEPRO CARB STEADY)  237 mL Oral BID BM  . [MAR Hold] insulin aspart  0-5 Units Subcutaneous QHS  . [MAR Hold] insulin aspart  0-9 Units Subcutaneous TID WC  . [MAR Hold] multivitamin  1 tablet Oral QHS  . [MAR Hold] pantoprazole  40 mg Oral BID AC  . [MAR Hold] QUEtiapine  25 mg Oral BID  . [MAR Hold] sodium chloride  flush  3 mL Intravenous Q12H

## 2016-11-13 NOTE — Transfer of Care (Signed)
Immediate Anesthesia Transfer of Care Note  Patient: Oscar Castillo  Procedure(s) Performed: Procedure(s): EXCHANGE OF A DIALYSIS CATHETER (Left)  Patient Location: PACU  Anesthesia Type:MAC  Level of Consciousness: drowsy and responds to stimulation  Airway & Oxygen Therapy: Patient Spontanous Breathing  Post-op Assessment: Report given to RN and Post -op Vital signs reviewed and stable  Post vital signs: Reviewed and stable  Last Vitals:  Vitals:   11/13/16 0900 11/13/16 1213  BP: (!) 156/92 (!) 157/96  Pulse: 72 77  Resp: 13 13  Temp: 36.6 C     Last Pain:  Vitals:   11/13/16 0900  TempSrc: Oral  PainSc:       Patients Stated Pain Goal: 0 (16/83/72 9021)  Complications: No apparent anesthesia complications

## 2016-11-14 ENCOUNTER — Encounter (HOSPITAL_COMMUNITY): Payer: Self-pay | Admitting: Vascular Surgery

## 2016-11-14 LAB — CBC
HCT: 33.5 % — ABNORMAL LOW (ref 39.0–52.0)
Hemoglobin: 10.8 g/dL — ABNORMAL LOW (ref 13.0–17.0)
MCH: 28.9 pg (ref 26.0–34.0)
MCHC: 32.2 g/dL (ref 30.0–36.0)
MCV: 89.6 fL (ref 78.0–100.0)
PLATELETS: 290 10*3/uL (ref 150–400)
RBC: 3.74 MIL/uL — ABNORMAL LOW (ref 4.22–5.81)
RDW: 18.3 % — AB (ref 11.5–15.5)
WBC: 6.1 10*3/uL (ref 4.0–10.5)

## 2016-11-14 LAB — PROTIME-INR
INR: 1.24
PROTHROMBIN TIME: 15.7 s — AB (ref 11.4–15.2)

## 2016-11-14 LAB — GLUCOSE, CAPILLARY
GLUCOSE-CAPILLARY: 113 mg/dL — AB (ref 65–99)
GLUCOSE-CAPILLARY: 90 mg/dL (ref 65–99)
GLUCOSE-CAPILLARY: 88 mg/dL (ref 65–99)

## 2016-11-14 MED ORDER — HEPARIN SODIUM (PORCINE) 1000 UNIT/ML DIALYSIS: 1000.0000 [IU] | INTRAMUSCULAR | Status: DC | PRN

## 2016-11-14 MED ORDER — SODIUM CHLORIDE 0.9 % IV SOLN: 100.0000 mL | INTRAVENOUS | Status: DC | PRN

## 2016-11-14 MED ORDER — ALTEPLASE 2 MG IJ SOLR: 2.0000 mg | Freq: Once | INTRAMUSCULAR | Status: DC | PRN

## 2016-11-14 MED ORDER — LIDOCAINE HCL (PF) 1 % IJ SOLN: 5.0000 mL | INTRAMUSCULAR | Status: DC | PRN

## 2016-11-14 MED ORDER — PENTAFLUOROPROP-TETRAFLUOROETH EX AERO: 1.0000 "application " | INHALATION_SPRAY | CUTANEOUS | Status: DC | PRN

## 2016-11-14 MED ORDER — WARFARIN SODIUM 5 MG PO TABS
5.0000 mg | ORAL_TABLET | Freq: Once | ORAL | Status: DC
  Filled 2016-11-14: qty 1

## 2016-11-14 MED ORDER — CEFAZOLIN IV (FOR PTA / DISCHARGE USE ONLY): 2.0000 g | INTRAVENOUS | 0 refills | Status: AC

## 2016-11-14 MED ORDER — LIDOCAINE-PRILOCAINE 2.5-2.5 % EX CREA: 1.0000 "application " | TOPICAL_CREAM | CUTANEOUS | Status: DC | PRN

## 2016-11-14 MED ORDER — HEPARIN SODIUM (PORCINE) 1000 UNIT/ML DIALYSIS
2800.0000 [IU] | Freq: Once | INTRAMUSCULAR | Status: AC
  Administered 2016-11-14: 2800 [IU] via INTRAVENOUS_CENTRAL

## 2016-11-14 MED ORDER — DOXERCALCIFEROL 4 MCG/2ML IV SOLN
INTRAVENOUS | Status: AC
  Filled 2016-11-14: qty 2

## 2016-11-14 NOTE — Progress Notes (Signed)
ANTICOAGULATION CONSULT NOTE - Follow-Up Consult   Pharmacy Consult for warfarin Indication: atrial fibrillation  No Known Allergies  Patient Measurements: Height: 5\' 10"  (177.8 cm) Weight: (P) 192 lb 7.4 oz (87.3 kg) IBW/kg (Calculated) : 73   Vital Signs: Temp: 98 F (36.7 C) (04/21 0656) Temp Source: Oral (04/21 0656) BP: 122/80 (04/21 0730) Pulse Rate: 76 (04/21 0730)  Labs:  Recent Labs  11/12/16 0711 11/13/16 0430 11/14/16 0301  HGB 10.5*  --  10.8*  HCT 32.4*  --  33.5*  PLT 221  --  290  LABPROT 17.4* 16.1* 15.7*  INR 1.41 1.29 1.24  CREATININE 9.58* 6.76*  --     Estimated Creatinine Clearance: 11.2 mL/min (A) (by C-G formula based on SCr of 6.76 mg/dL (H)).  Assessment: 50 YOM admitted with bacteremia- warfarin was on hold due to need for procedures. Now s/p TDC placement. Warfarin re-started 4/20. Home dose 3.75mg  daily except 2.5mg  on Tuesdays, Fridays, and Sundays.  INR 1.24 today s/p 1 dose of 5 mg. Hgb 10.8, plts 290- no overt bleeding noted.  Patient with GIB in March 2018.  Goal of Therapy:  INR 2-3 Monitor platelets by anticoagulation protocol: Yes   Plan:  -Repeat warfarin 5 mg x 1 tonight  -daily INR while here - Q 72 hour CBC -follow closely for s/s bleeding  Uvaldo Bristle, PharmD PGY1 Pharmacy Resident Pager: [phone] 11/14/2016 8:18 AM

## 2016-11-14 NOTE — Progress Notes (Signed)
Subjective:  Going to OR this am  Objective Vital signs in last 24 hours: Vitals:   11/14/16 0930 11/14/16 1000 11/14/16 1030 11/14/16 1108  BP: 98/89 113/82 134/86 140/83  Pulse: 80 80 78 80  Resp: 14 10 16 18   Temp:   98 F (36.7 C) 98 F (36.7 C)  TempSrc:   Oral Oral  SpO2:   100% 99%  Weight:   85 kg (187 lb 6.3 oz)   Height:       Weight change: -1.3 kg (-2 lb 13.9 oz)  Physical Exam: General: alert  NAD , OX3 .  Lungs: CTA  nonlabored breathing  Heart:RRR no mur, or rub .  Abdomen: soft NT + BS Lower extremities: Bilateral BKA with no edema Dialysis Access: left upper AVF clean dry surgical site / L IJ Temp  HD cath   [facility]   83kg   2/2.5 bath  P4   Hep 2800  LUA AVF - Hect 2 ug - no esa/ Fe - last hgb 14.2 4/12 38% sat Ca/P ok iPTH 362 - recent antibiotics: 1.75 gm Lucianne Lei 4/14 and 2 gm Tressie Ellis 4/14 prior to admission  Assessment: 1. MSSA bacteremia w infected LUA AVF and stent - sp AVF/ stent resection on 4/16, sp new tunneled HD cath 4/20.  F/U +blood cx was s. Hominis (not S. Aureus), so ID suspects this was a contaminant. Plan is for IV Ancef w HD x 2 wks through 4/30  2. ESRD- TTS HD 3. Vol - +4kg pre HD today 4. Anemia- hgb 12.9 >10.9 >10.8 follow  Trend.- off ESA last dose was 75 on 4/3 5. Metabolic bone disease-  phos 2.5>1.9   Ca. Corrected 9.9 on hectorol/ binders/sensipar; holding  calcium acetate with low phos / fu trend   6. DM - per primary 7. Nutrition-renal carb mod diet - vits/nepro 8. Hx afib/aflutter- NSR now - on amiodarone and chronic coumadin - /per admit  9. Bipolar / depression - per admit  restart home seroquel    Plan - ok for dc after HD today, have d/w primary team.  Ancef thru 4/30  Kelly Splinter MD [facility] pgr [phone]   11/14/2016, 12:26 PM    Labs: Basic Metabolic Panel:  Recent Labs Lab 11/10/16 0247 11/11/16 0440 11/12/16 0711 11/13/16 0430  NA 134* 134* 134* 133*   K 3.7 3.8 4.0 4.1  CL 100* 98* 99* 93*  CO2 24 26 24 26   GLUCOSE 118* 101* 99 112*  BUN 48* 25* 39* 20  CREATININE 10.60* 7.61* 9.58* 6.76*  CALCIUM 8.2* 7.9* 7.7* 8.0*  PHOS 1.9* 1.9* 3.2  --    Liver Function Tests:  Recent Labs Lab 11/08/16 0000  11/10/16 0247 11/11/16 0440 11/12/16 0711  AST 17  --   --   --   --   ALT 12*  --   --   --   --   ALKPHOS 44  --   --   --   --   BILITOT 0.8  --   --   --   --   PROT 7.4  --   --   --   --   ALBUMIN 3.3*  < > 2.6* 2.6* 2.5*  < > = values in this interval not displayed.CBC:  Recent Labs Lab 11/08/16 0000 11/09/16 0316 11/10/16 0247 11/11/16 0440 11/12/16 0711 11/14/16 0301  WBC 6.1 5.2 4.9 3.9* 4.8 6.1  NEUTROABS 4.7  --   --   --   --   --  HGB 12.9* 10.9* 10.8* 10.4* 10.5* 10.8*  HCT 40.0 33.8* 33.5* 32.2* 32.4* 33.5*  MCV 93.0 91.1 90.1 89.7 90.0 89.6  PLT 147* 152 155 172 221 290   Cardiac Enzymes:  Recent Labs Lab 11/08/16 0753  TROPONINI 0.03*   CBG:  Recent Labs Lab 11/13/16 1651 11/13/16 2103 11/14/16 0233 11/14/16 0545 11/14/16 1107  GLUCAP 166* 138* 113* 90 88    Studies/Results: Dg Chest Port 1 View  Result Date: 11/13/2016 CLINICAL DATA:  Status post central line placement for dialysis today. EXAM: PORTABLE CHEST 1 VIEW COMPARISON:  Single-view of the chest 11/09/2016. FINDINGS: Double lumen left IJ approach central venous catheter is in place. Tip of the catheter projects deep in the right atrium. No pneumothorax. Lungs are clear. Cardiomegaly noted. IMPRESSION: Template dialysis catheter projects deep in the right atrium. Negative for pneumothorax. Cardiomegaly without edema. Electronically Signed   By: Inge Rise M.D.   On: 11/13/2016 13:00   Dg Fluoro Guide Cv Line-no Report  Result Date: 11/13/2016 Fluoroscopy was utilized by the requesting physician.  No radiographic interpretation.   Medications: . sodium chloride 10 mL/hr at 11/09/16 1622  .  ceFAZolin (ANCEF) IV     .  amiodarone  200 mg Oral Daily  . atorvastatin  40 mg Oral q1800  . cinacalcet  30 mg Oral QPM  . doxercalciferol  2 mcg Intravenous Q T,Th,Sa-HD  . feeding supplement (NEPRO CARB STEADY)  237 mL Oral BID BM  . insulin aspart  0-5 Units Subcutaneous QHS  . insulin aspart  0-9 Units Subcutaneous TID WC  . multivitamin  1 tablet Oral QHS  . pantoprazole  40 mg Oral BID AC  . QUEtiapine  25 mg Oral BID  . sodium chloride flush  3 mL Intravenous Q12H  . warfarin  5 mg Oral ONCE-1800  . Warfarin - Pharmacist Dosing Inpatient   Does not apply [phone]

## 2016-11-14 NOTE — Discharge Summary (Signed)
Physician Discharge Summary  Oscar Castillo MMN:[id number] DOB: 1950/11/29 DOA: 65/14/2018  PCP: [name], MD  Admit date: 11/07/2016 Discharge date: 11/14/2016  Time spent: 25* minutes  Recommendations for Outpatient Follow-up:  1. Follow up Vascular surgery as outpatient, they will call for appointment   Discharge Diagnoses:  Principal Problem:   Sepsis ([place]) Active Problems:   Hypertensive heart disease with CHF (congestive heart failure) ([place])   Type 2 diabetes mellitus with chronic kidney disease on chronic dialysis, with long-term current use of insulin (HCC)   Chronic combined systolic and diastolic congestive heart failure   ESRD on hemodialysis (H[facility])   S/P BKA (below knee amputation) bilateral (HCC)   Paroxysmal atrial fibrillation (HCC)   Anemia due to stage 5 chronic kidney disease ([place])   MSSA bacteremia   Discharge Condition: Stable  Diet recommendation: Carb modified diet  Filed Weights   11/12/16 2127 11/14/16 0656 11/14/16 1030  Weight: 86 kg (189 lb 9.5 oz) 87.3 kg (192 lb 7.4 oz) 85 kg (187 lb 6.3 oz)    History of present illness:  66 y.o.malewith a past medical history significant for ESRD on HD MWF, CHF EF 50%, Afib on amiodarone and warfarin, NIDDM and bilateral BKAwho presents with fever, malaise. He was admitted with sepsis thought secondary to infected left arm fistula/stent. He has staph aureus bacteremia. ID consulted. VVS and nephrology following. Pt to OR 4/16 for removal of stents and placement of temp dialysis catheter.   Hospital Course:   1. Sepsis- secondary to infected AVF, he was taken to OR on 4/16 for AVF removal and placement of temporary dialysis catheter. He is currently on IV cefazolin per ID. Lactic acid on 11/08/2016 was 1.2.  2. Staph aureus bacteremia- Blood cultures grew methicillin sensitive staph aureus, treated with cefazolin as above. Tunneled catheter was  placed on 4/201/8, repeat blood cultures sent on 4/17 is  negative to date. Blood cultures from 4/16 positive for S hominis, likely contaminant. Patient will be discharged on Cefazolin 2 gm IV on T-Th- Sat dialysis till 11/26/16 3. ESRD on dialysis- patient gets dialysis Tuesday Thursday and Saturday. Nephrology following 4. Diabetes mellitus - blood was controlled during hospital stay with sliding scale insulin, hemoglobin A1c is 6.4. Patient not on any home medications. Will follow up PCP for further recommendations. 5. Hypertension- blood pressure stable. 6. History of atrial fibrillation/atrial flutter- heart rate is controlled, continue amiodarone. Coumadin was held, after AVF removal on 11/09/2016.  Coumadin has been restarted, INR is 1.24 7. Bipolar disorder/depression-continue Seroquel.   Procedures:  4/16 stent removal and temp dialysis catheter placement  Hemodialysis, tunneled catheter placed on 11/13/2016  Consultations:  Vascular surgery  Nephrology  Discharge Exam: Vitals:   11/14/16 1030 11/14/16 1108  BP: 134/86 140/83  Pulse: 78 80  Resp: 16 18  Temp: 98 F (36.7 C) 98 F (36.7 C)    General: Appears in no acute distress Cardiovascular: Irregular,  Respiratory: Clear to auscultation bilaterally  Discharge Instructions   Discharge Instructions    Diet - low sodium heart healthy    Complete by:  As directed    Home infusion instructions [facility] May follow [facility] Dosing Protocol; May administer Cathflo as needed to maintain patency of vascular access device.; [place] of vascular access device: per [facility] Protocol: 0.9% NaCl pre/post medica...    Complete by:  As directed    Instructions:  May follow [facility] Dosing Protocol   Instructions:  May administer Cathflo as  needed to maintain patency of vascular access device.   Instructions:  Flushing of vascular access device: per [facility] Protocol: 0.9% NaCl pre/post medication administration and prn patency; Heparin 100 u/ml, 21m for implanted ports and  Heparin 10u/ml, 5267mfor all other central venous catheters.   Instructions:  May follow [facility] Anaphylaxis Protocol for First Dose Administration in the home: 0.9% NaCl at 25-50 ml/hr to maintain IV access for protocol meds. Epinephrine 0.3 ml IV/IM PRN and Benadryl 25-50 IV/IM PRN s/s of anaphylaxis.   Instructions:  [facility] Coordinator (RN) to assist per patient IV care needs in the home PRN.   Increase activity slowly    Complete by:  As directed      Current Discharge Medication List    START taking these medications   Details  ceFAZolin (ANCEF) IVPB Inject 2 g into the vein Every Tuesday,Thursday,and Saturday with dialysis. Indication:  MSSA Bacteremia Last Day of Therapy:  11/26/2016 Labs - Once weekly:  CBC/D and BMP, Labs - Every other week:  ESR and CRP Qty: 6 Units, Refills: 0      CONTINUE these medications which have NOT CHANGED   Details  acetaminophen (TYLENOL) 500 MG tablet Take 500 mg by mouth every 6 (six) hours as needed for mild pain.    amiodarone (PACERONE) 200 MG tablet TAKE 1 TABLET BY MOUTH DAILY, START THIS ON 11/06/16 Qty: 90 tablet, Refills: 3    atorvastatin (LIPITOR) 40 MG tablet TAKE 1 TABLET(40 MG) BY MOUTH DAILY AT 6 PM Qty: 30 tablet, Refills: 3    calcium acetate (PHOSLO) 667 MG capsule Take 2,001 mg by mouth 3 (three) times daily with meals.    latanoprost (XALATAN) 0.005 % ophthalmic solution Place 1 drop into both eyes at bedtime.     lidocaine-prilocaine (EMLA) cream APPLY A SMALL AMOUNT TO SKIN AT THE ACCESS SITE (AVF) AS DIRECTED BEFORE EACH DIALYSIS SESSION THREE DAYS A WEEK Refills: 6    pantoprazole (PROTONIX) 40 MG tablet Take 1 tablet (40 mg total) by mouth 2 (two) times daily before a meal. Qty: 60 tablet, Refills: 11    QUEtiapine (SEROQUEL) 25 MG tablet Take 1 tablet (25 mg total) by mouth 2 (two) times daily. Qty: 60 tablet, Refills: 5    SENSIPAR 30 MG tablet Take 30 mg by mouth every evening.     silver  sulfADIAZINE (SILVADENE) 1 % cream Apply 1 application topically daily as needed (irratation).    warfarin (COUMADIN) 2.5 MG tablet Take 2.67m4mightly, check INR on 10/05/2016 Qty: 30 tablet, Refills: 0       No Known Allergies    The results of significant diagnostics from this hospitalization (including imaging, microbiology, ancillary and laboratory) are listed below for reference.    Significant Diagnostic Studies: Dg Chest Port 1 View  Result Date: 11/13/2016 CLINICAL DATA:  Status post central line placement for dialysis today. EXAM: PORTABLE CHEST 1 VIEW COMPARISON:  Single-view of the chest 11/09/2016. FINDINGS: Double lumen left IJ approach central venous catheter is in place. Tip of the catheter projects deep in the right atrium. No pneumothorax. Lungs are clear. Cardiomegaly noted. IMPRESSION: Template dialysis catheter projects deep in the right atrium. Negative for pneumothorax. Cardiomegaly without edema. Electronically Signed   By: ThoInge RiseD.   On: 11/13/2016 13:00   Dg Chest Port 1 View  Result Date: 11/09/2016 CLINICAL DATA:  Central line placement EXAM: PORTABLE CHEST 1 VIEW COMPARISON:  11/08/2016 FINDINGS: Left-sided central venous catheter tip  overlies the distal SVC. No left pneumothorax. Mild cardiomegaly with central vascular congestion. No focal consolidation IMPRESSION: Left-sided central venous catheter tip overlies the distal SVC. No left pneumothorax. Cardiomegaly with mild central vascular congestion Electronically Signed   By: Donavan Foil M.D.   On: 11/09/2016 20:01   Dg Chest Port 1 View  Result Date: 11/09/2016 CLINICAL DATA:  Preoperative evaluation for stents in the left upper arm AV graft. EXAM: PORTABLE CHEST 1 VIEW COMPARISON:  11/08/2016 FINDINGS: Shallow inspiration. Heart size and pulmonary vascularity are normal. No focal airspace disease or consolidation in the lungs. No blunting of costophrenic angles. No pneumothorax. Stomach is mildly  prominent with gas and shadows likely representing ingested material. Vascular graft is noted in the left upper arm. IMPRESSION: Shallow inspiration.  No evidence of active pulmonary disease. Electronically Signed   By: Lucienne Capers M.D.   On: 11/09/2016 00:40   Dg Chest Port 1 View  Result Date: 11/08/2016 CLINICAL DATA:  Acute onset of generalized weakness. Initial encounter. EXAM: PORTABLE CHEST 1 VIEW COMPARISON:  Chest radiograph performed 07/01/2016 FINDINGS: The lungs are well-aerated and clear. There is no evidence of focal opacification, pleural effusion or pneumothorax. The cardiomediastinal silhouette is within normal limits. No acute osseous abnormalities are seen. The esophagus appears to be filled with air. IMPRESSION: 1. No acute cardiopulmonary process seen. 2. Esophagus appears to be filled with air. Would correlate clinically for any evidence of esophageal dysmotility or recent swallowing of air. Electronically Signed   By: Garald Balding M.D.   On: 11/08/2016 01:04   Dg Humerus Left  Result Date: 11/09/2016 CLINICAL DATA:  Preoperative evaluation of stents in the left upper arm AV graft. End-stage renal disease. EXAM: LEFT HUMERUS - 2+ VIEW COMPARISON:  None. FINDINGS: Vascular grafts demonstrated in the left upper arm. Diffuse vascular calcifications are present. Surgical clips in the antecubital fossa. No soft tissue gas collections demonstrated. Left humerus appears intact. No focal bone lesion or bone erosion. IMPRESSION: Postoperative changes demonstrated in the left upper arm with vascular grafts present. Diffuse vascular calcifications. Electronically Signed   By: Lucienne Capers M.D.   On: 11/09/2016 00:42   Dg Fluoro Guide Cv Line-no Report  Result Date: 11/13/2016 Fluoroscopy was utilized by the requesting physician.  No radiographic interpretation.    Microbiology: Recent Results (from the past 240 hour(s))  Culture, blood (Routine x 2)     Status: Abnormal    Collection Time: 11/08/16 12:00 AM  Result Value Ref Range Status   Specimen Description BLOOD RIGHT ARM  Final   Special Requests   Final    BOTTLES DRAWN AEROBIC AND ANAEROBIC Blood Culture adequate volume   Culture  Setup Time   Final    GRAM POSITIVE COCCI IN CLUSTERS ANAEROBIC BOTTLE ONLY CRITICAL VALUE NOTED.  VALUE IS CONSISTENT WITH PREVIOUSLY REPORTED AND CALLED VALUE.    Culture (A)  Final    STAPHYLOCOCCUS AUREUS SUSCEPTIBILITIES PERFORMED ON PREVIOUS CULTURE WITHIN THE LAST 5 DAYS.    Report Status 11/13/2016 FINAL  Final  Culture, blood (Routine x 2)     Status: Abnormal   Collection Time: 11/08/16 12:12 AM  Result Value Ref Range Status   Specimen Description BLOOD RIGHT HAND  Final   Special Requests IN PEDIATRIC BOTTLE Blood Culture adequate volume  Final   Culture  Setup Time   Final    GRAM POSITIVE COCCI IN CLUSTERS IN PEDIATRIC BOTTLE CRITICAL RESULT CALLED TO, READ BACK BY AND VERIFIED WITH: T  STONE, PHARM, 11/08/16 AT 1616 BY J FUDESCO    Culture STAPHYLOCOCCUS AUREUS (A)  Final   Report Status 11/10/2016 FINAL  Final   Organism ID, Bacteria STAPHYLOCOCCUS AUREUS  Final      Susceptibility   Staphylococcus aureus - MIC*    CIPROFLOXACIN <=0.5 SENSITIVE Sensitive     ERYTHROMYCIN >=8 RESISTANT Resistant     GENTAMICIN <=0.5 SENSITIVE Sensitive     OXACILLIN 0.5 SENSITIVE Sensitive     TETRACYCLINE <=1 SENSITIVE Sensitive     VANCOMYCIN <=0.5 SENSITIVE Sensitive     TRIMETH/SULFA <=10 SENSITIVE Sensitive     CLINDAMYCIN <=0.25 SENSITIVE Sensitive     RIFAMPIN <=0.5 SENSITIVE Sensitive     Inducible Clindamycin NEGATIVE Sensitive     * STAPHYLOCOCCUS AUREUS  Blood Culture ID Panel (Reflexed)     Status: Abnormal   Collection Time: 11/08/16 12:12 AM  Result Value Ref Range Status   Enterococcus species NOT DETECTED NOT DETECTED Final   Listeria monocytogenes NOT DETECTED NOT DETECTED Final   Staphylococcus species DETECTED (A) NOT DETECTED Final     Comment: CRITICAL RESULT CALLED TO, READ BACK BY AND VERIFIED WITH: T. STONE, PHARM, 11/08/16 AT 1616 BY J FUDESCO    Staphylococcus aureus DETECTED (A) NOT DETECTED Final    Comment: Methicillin (oxacillin) susceptible Staphylococcus aureus (MSSA). Preferred therapy is anti staphylococcal beta lactam antibiotic (Cefazolin or Nafcillin), unless clinically contraindicated. CRITICAL RESULT CALLED TO, READ BACK BY AND VERIFIED WITH: T. STONE, PHARM, 11/08/16 AT 1616 BY J FUDESCO    Methicillin resistance NOT DETECTED NOT DETECTED Final   Streptococcus species NOT DETECTED NOT DETECTED Final   Streptococcus agalactiae NOT DETECTED NOT DETECTED Final   Streptococcus pneumoniae NOT DETECTED NOT DETECTED Final   Streptococcus pyogenes NOT DETECTED NOT DETECTED Final   Acinetobacter baumannii NOT DETECTED NOT DETECTED Final   Enterobacteriaceae species NOT DETECTED NOT DETECTED Final   Enterobacter cloacae complex NOT DETECTED NOT DETECTED Final   Escherichia coli NOT DETECTED NOT DETECTED Final   Klebsiella oxytoca NOT DETECTED NOT DETECTED Final   Klebsiella pneumoniae NOT DETECTED NOT DETECTED Final   Proteus species NOT DETECTED NOT DETECTED Final   Serratia marcescens NOT DETECTED NOT DETECTED Final   Haemophilus influenzae NOT DETECTED NOT DETECTED Final   Neisseria meningitidis NOT DETECTED NOT DETECTED Final   Pseudomonas aeruginosa NOT DETECTED NOT DETECTED Final   Candida albicans NOT DETECTED NOT DETECTED Final   Candida glabrata NOT DETECTED NOT DETECTED Final   Candida krusei NOT DETECTED NOT DETECTED Final   Candida parapsilosis NOT DETECTED NOT DETECTED Final   Candida tropicalis NOT DETECTED NOT DETECTED Final  MRSA PCR Screening     Status: None   Collection Time: 11/08/16  5:49 AM  Result Value Ref Range Status   MRSA by PCR NEGATIVE NEGATIVE Final    Comment:        The GeneXpert MRSA Assay (FDA approved for NASAL specimens only), is one component of a comprehensive MRSA  colonization surveillance program. It is not intended to diagnose MRSA infection nor to guide or monitor treatment for MRSA infections.   Aerobic Culture (superficial specimen)     Status: None   Collection Time: 11/09/16  7:05 PM  Result Value Ref Range Status   Specimen Description WOUND LEFT ARM  Final   Special Requests   Final    SWAB OF FLUID FROM STENT GRAFT LUMEN PT ON ANCEF, VANC   Gram Stain   Final    FEW  WBC PRESENT,BOTH PMN AND MONONUCLEAR NO ORGANISMS SEEN    Culture FEW STAPHYLOCOCCUS AUREUS  Final   Report Status 11/12/2016 FINAL  Final   Organism ID, Bacteria STAPHYLOCOCCUS AUREUS  Final      Susceptibility   Staphylococcus aureus - MIC*    CIPROFLOXACIN <=0.5 SENSITIVE Sensitive     ERYTHROMYCIN >=8 RESISTANT Resistant     GENTAMICIN <=0.5 SENSITIVE Sensitive     OXACILLIN 0.5 SENSITIVE Sensitive     TETRACYCLINE <=1 SENSITIVE Sensitive     VANCOMYCIN <=0.5 SENSITIVE Sensitive     TRIMETH/SULFA <=10 SENSITIVE Sensitive     CLINDAMYCIN <=0.25 SENSITIVE Sensitive     RIFAMPIN <=0.5 SENSITIVE Sensitive     Inducible Clindamycin NEGATIVE Sensitive     * FEW STAPHYLOCOCCUS AUREUS  Culture, blood (Routine X 2) w Reflex to ID Panel     Status: Abnormal   Collection Time: 11/10/16  8:41 AM  Result Value Ref Range Status   Specimen Description BLOOD RIGHT ARM  Final   Special Requests IN PEDIATRIC BOTTLE Blood Culture adequate volume  Final   Culture  Setup Time   Final    GRAM POSITIVE COCCI IN CLUSTERS IN PEDIATRIC BOTTLE CRITICAL VALUE NOTED.  VALUE IS CONSISTENT WITH PREVIOUSLY REPORTED AND CALLED VALUE.    Culture (A)  Final    STAPHYLOCOCCUS HOMINIS THE SIGNIFICANCE OF ISOLATING THIS ORGANISM FROM A SINGLE SET OF BLOOD CULTURES WHEN MULTIPLE SETS ARE DRAWN IS UNCERTAIN. PLEASE NOTIFY THE MICROBIOLOGY DEPARTMENT WITHIN ONE WEEK IF SPECIATION AND SENSITIVITIES ARE REQUIRED.    Report Status 11/13/2016 FINAL  Final  Culture, blood (single)     Status: None  (Preliminary result)   Collection Time: 11/11/16 11:42 AM  Result Value Ref Range Status   Specimen Description BLOOD RIGHT ARM  Final   Special Requests   Final    BOTTLES DRAWN AEROBIC AND ANAEROBIC Blood Culture adequate volume   Culture NO GROWTH 3 DAYS  Final   Report Status PENDING  Incomplete     Labs: Basic Metabolic Panel:  Recent Labs Lab 11/09/16 0316 11/10/16 0247 11/11/16 0440 11/12/16 0711 11/13/16 0430  NA 136 134* 134* 134* 133*  K 4.5 3.7 3.8 4.0 4.1  CL 101 100* 98* 99* 93*  CO2 '23 24 26 24 26  ' GLUCOSE 111* 118* 101* 99 112*  BUN 43* 48* 25* 39* 20  CREATININE 9.36* 10.60* 7.61* 9.58* 6.76*  CALCIUM 8.3* 8.2* 7.9* 7.7* 8.0*  PHOS 2.5 1.9* 1.9* 3.2  --    Liver Function Tests:  Recent Labs Lab 11/08/16 0000 11/09/16 0316 11/10/16 0247 11/11/16 0440 11/12/16 0711  AST 17  --   --   --   --   ALT 12*  --   --   --   --   ALKPHOS 44  --   --   --   --   BILITOT 0.8  --   --   --   --   PROT 7.4  --   --   --   --   ALBUMIN 3.3* 2.7* 2.6* 2.6* 2.5*   No results for input(s): LIPASE, AMYLASE in the last 168 hours. No results for input(s): AMMONIA in the last 168 hours. CBC:  Recent Labs Lab 11/08/16 0000 11/09/16 0316 11/10/16 0247 11/11/16 0440 11/12/16 0711 11/14/16 0301  WBC 6.1 5.2 4.9 3.9* 4.8 6.1  NEUTROABS 4.7  --   --   --   --   --   HGB  12.9* 10.9* 10.8* 10.4* 10.5* 10.8*  HCT 40.0 33.8* 33.5* 32.2* 32.4* 33.5*  MCV 93.0 91.1 90.1 89.7 90.0 89.6  PLT 147* 152 155 172 221 290   Cardiac Enzymes:  Recent Labs Lab 11/08/16 0753  TROPONINI 0.03*   BNP:  CBG:  Recent Labs Lab 11/13/16 1651 11/13/16 2103 11/14/16 0233 11/14/16 0545 11/14/16 1107  GLUCAP 166* 138* 113* 90 88    Signed:  Marquisha Nikolov S MD.  [facility] 11/14/2016, 1:58 PM

## 2016-11-14 NOTE — Discharge Instructions (Signed)

## 2016-11-14 NOTE — Progress Notes (Signed)
   Evaluated on hd via tdc placed yesterday. Left upper arm incision is healing well. Will have f/u in office with vein mapping and arterial duplex of upper extremities to plan new access as outpatient.   Johanna Matto C. Donzetta Matters, MD [facility] Office: [phone] Pager: [phone]

## 2016-11-14 NOTE — Progress Notes (Signed)
ANTICOAGULATION CONSULT NOTE - Follow-Up Consult   Pharmacy Consult for warfarin Indication: atrial fibrillation  No Known Allergies  Patient Measurements: Height: 5\' 10"  (177.8 cm) Weight: (P) 192 lb 7.4 oz (87.3 kg) IBW/kg (Calculated) : 73   Vital Signs: Temp: 98 F (36.7 C) (04/21 0656) Temp Source: Oral (04/21 0656) BP: 122/80 (04/21 0730) Pulse Rate: 76 (04/21 0730)  Labs:  Recent Labs  11/12/16 0711 11/13/16 0430 11/14/16 0301  HGB 10.5*  --  10.8*  HCT 32.4*  --  33.5*  PLT 221  --  290  LABPROT 17.4* 16.1* 15.7*  INR 1.41 1.29 1.24  CREATININE 9.58* 6.76*  --     Estimated Creatinine Clearance: 11.2 mL/min (A) (by C-G formula based on SCr of 6.76 mg/dL (H)).  Assessment: 69 YOM admitted with bacteremia- warfarin was on hold due to need for procedures. Now s/p TDC placement. Warfarin re-started 4/20. Home dose 3.75mg  daily except 2.5mg  on Tuesdays, Fridays, and Sundays.  INR 1.24 today s/p 1 dose of 5 mg. Hgb 10.8, plts 290- no overt bleeding noted.  Patient with GIB in March 2018.  Goal of Therapy:  INR 2-3 Monitor platelets by anticoagulation protocol: Yes   Plan:  -Repeat warfarin 5 mg x 1 tonight  - For discharge would recommend 5 mg tonight then resumption of home dosing regimen tomorrow . 3.75 mg all days except 2.5 mg on Tuesday, Fridays, and Sundays.  - INR check early next week.  -daily INR while here - Q 72 hour CBC -follow closely for s/s bleeding  Uvaldo Bristle, PharmD PGY1 Pharmacy Resident Pager: [phone] 11/14/2016 8:22 AM

## 2016-11-16 DIAGNOSIS — I480 Paroxysmal atrial fibrillation: Secondary | ICD-10-CM | POA: Diagnosis not present

## 2016-11-16 DIAGNOSIS — I5042 Chronic combined systolic (congestive) and diastolic (congestive) heart failure: Secondary | ICD-10-CM | POA: Diagnosis not present

## 2016-11-16 DIAGNOSIS — E1122 Type 2 diabetes mellitus with diabetic chronic kidney disease: Secondary | ICD-10-CM | POA: Diagnosis not present

## 2016-11-16 DIAGNOSIS — N186 End stage renal disease: Secondary | ICD-10-CM | POA: Diagnosis not present

## 2016-11-16 DIAGNOSIS — D631 Anemia in chronic kidney disease: Secondary | ICD-10-CM | POA: Diagnosis not present

## 2016-11-16 DIAGNOSIS — I132 Hypertensive heart and chronic kidney disease with heart failure and with stage 5 chronic kidney disease, or end stage renal disease: Secondary | ICD-10-CM | POA: Diagnosis not present

## 2016-11-16 LAB — CULTURE, BLOOD (SINGLE)
Culture: NO GROWTH
Special Requests: ADEQUATE

## 2016-11-17 ENCOUNTER — Telehealth: Payer: Self-pay | Admitting: Vascular Surgery

## 2016-11-17 ENCOUNTER — Ambulatory Visit (INDEPENDENT_AMBULATORY_CARE_PROVIDER_SITE_OTHER): Payer: Medicare Other

## 2016-11-17 DIAGNOSIS — I4891 Unspecified atrial fibrillation: Secondary | ICD-10-CM | POA: Diagnosis not present

## 2016-11-17 DIAGNOSIS — I638 Other cerebral infarction: Secondary | ICD-10-CM | POA: Diagnosis not present

## 2016-11-17 DIAGNOSIS — D631 Anemia in chronic kidney disease: Secondary | ICD-10-CM | POA: Diagnosis not present

## 2016-11-17 DIAGNOSIS — R509 Fever, unspecified: Secondary | ICD-10-CM | POA: Diagnosis not present

## 2016-11-17 DIAGNOSIS — N2581 Secondary hyperparathyroidism of renal origin: Secondary | ICD-10-CM | POA: Diagnosis not present

## 2016-11-17 DIAGNOSIS — E1122 Type 2 diabetes mellitus with diabetic chronic kidney disease: Secondary | ICD-10-CM | POA: Diagnosis not present

## 2016-11-17 DIAGNOSIS — Z5181 Encounter for therapeutic drug level monitoring: Secondary | ICD-10-CM

## 2016-11-17 DIAGNOSIS — G459 Transient cerebral ischemic attack, unspecified: Secondary | ICD-10-CM | POA: Diagnosis not present

## 2016-11-17 DIAGNOSIS — N186 End stage renal disease: Secondary | ICD-10-CM | POA: Diagnosis not present

## 2016-11-17 DIAGNOSIS — R7881 Bacteremia: Secondary | ICD-10-CM | POA: Diagnosis not present

## 2016-11-17 LAB — POCT INR: INR: 1.3

## 2016-11-17 NOTE — Telephone Encounter (Signed)
Scheduled 5/7 labs and 5/9 office visit with Dr. Bridgett Larsson.

## 2016-11-17 NOTE — Telephone Encounter (Signed)
-----   Message from Mena Goes, RN sent at 11/16/2016 11:27 AM EDT ----- Regarding: asap with any MD per [facility] to plan new access   ----- Message ----- From: Waynetta Sandy, MD Sent: 11/14/2016  12:51 PM To: Vvs Charge Pool  Needs to f/u with Dr. Oneida Alar or other MD to plan new hd access with bilateral upper extremity vein mapping and arterial duplexes.   bc

## 2016-11-18 DIAGNOSIS — E1122 Type 2 diabetes mellitus with diabetic chronic kidney disease: Secondary | ICD-10-CM | POA: Diagnosis not present

## 2016-11-18 DIAGNOSIS — I132 Hypertensive heart and chronic kidney disease with heart failure and with stage 5 chronic kidney disease, or end stage renal disease: Secondary | ICD-10-CM | POA: Diagnosis not present

## 2016-11-18 DIAGNOSIS — D631 Anemia in chronic kidney disease: Secondary | ICD-10-CM | POA: Diagnosis not present

## 2016-11-18 DIAGNOSIS — N186 End stage renal disease: Secondary | ICD-10-CM | POA: Diagnosis not present

## 2016-11-18 DIAGNOSIS — I5042 Chronic combined systolic (congestive) and diastolic (congestive) heart failure: Secondary | ICD-10-CM | POA: Diagnosis not present

## 2016-11-18 DIAGNOSIS — I480 Paroxysmal atrial fibrillation: Secondary | ICD-10-CM | POA: Diagnosis not present

## 2016-11-19 ENCOUNTER — Encounter: Payer: Self-pay | Admitting: Vascular Surgery

## 2016-11-19 DIAGNOSIS — D631 Anemia in chronic kidney disease: Secondary | ICD-10-CM | POA: Diagnosis not present

## 2016-11-19 DIAGNOSIS — N2581 Secondary hyperparathyroidism of renal origin: Secondary | ICD-10-CM | POA: Diagnosis not present

## 2016-11-19 DIAGNOSIS — I4891 Unspecified atrial fibrillation: Secondary | ICD-10-CM | POA: Diagnosis not present

## 2016-11-19 DIAGNOSIS — R509 Fever, unspecified: Secondary | ICD-10-CM | POA: Diagnosis not present

## 2016-11-19 DIAGNOSIS — N186 End stage renal disease: Secondary | ICD-10-CM | POA: Diagnosis not present

## 2016-11-19 DIAGNOSIS — R7881 Bacteremia: Secondary | ICD-10-CM | POA: Diagnosis not present

## 2016-11-19 DIAGNOSIS — E1122 Type 2 diabetes mellitus with diabetic chronic kidney disease: Secondary | ICD-10-CM | POA: Diagnosis not present

## 2016-11-19 DIAGNOSIS — Z5181 Encounter for therapeutic drug level monitoring: Secondary | ICD-10-CM | POA: Diagnosis not present

## 2016-11-21 DIAGNOSIS — R509 Fever, unspecified: Secondary | ICD-10-CM | POA: Diagnosis not present

## 2016-11-21 DIAGNOSIS — D631 Anemia in chronic kidney disease: Secondary | ICD-10-CM | POA: Diagnosis not present

## 2016-11-21 DIAGNOSIS — R7881 Bacteremia: Secondary | ICD-10-CM | POA: Diagnosis not present

## 2016-11-21 DIAGNOSIS — N2581 Secondary hyperparathyroidism of renal origin: Secondary | ICD-10-CM | POA: Diagnosis not present

## 2016-11-21 DIAGNOSIS — E1122 Type 2 diabetes mellitus with diabetic chronic kidney disease: Secondary | ICD-10-CM | POA: Diagnosis not present

## 2016-11-21 DIAGNOSIS — N186 End stage renal disease: Secondary | ICD-10-CM | POA: Diagnosis not present

## 2016-11-23 DIAGNOSIS — I480 Paroxysmal atrial fibrillation: Secondary | ICD-10-CM | POA: Diagnosis not present

## 2016-11-23 DIAGNOSIS — I132 Hypertensive heart and chronic kidney disease with heart failure and with stage 5 chronic kidney disease, or end stage renal disease: Secondary | ICD-10-CM | POA: Diagnosis not present

## 2016-11-23 DIAGNOSIS — D631 Anemia in chronic kidney disease: Secondary | ICD-10-CM | POA: Diagnosis not present

## 2016-11-23 DIAGNOSIS — Z992 Dependence on renal dialysis: Secondary | ICD-10-CM | POA: Diagnosis not present

## 2016-11-23 DIAGNOSIS — N186 End stage renal disease: Secondary | ICD-10-CM | POA: Diagnosis not present

## 2016-11-23 DIAGNOSIS — E1122 Type 2 diabetes mellitus with diabetic chronic kidney disease: Secondary | ICD-10-CM | POA: Diagnosis not present

## 2016-11-23 DIAGNOSIS — I5042 Chronic combined systolic (congestive) and diastolic (congestive) heart failure: Secondary | ICD-10-CM | POA: Diagnosis not present

## 2016-11-24 DIAGNOSIS — E1122 Type 2 diabetes mellitus with diabetic chronic kidney disease: Secondary | ICD-10-CM | POA: Diagnosis not present

## 2016-11-24 DIAGNOSIS — D631 Anemia in chronic kidney disease: Secondary | ICD-10-CM | POA: Diagnosis not present

## 2016-11-24 DIAGNOSIS — N2581 Secondary hyperparathyroidism of renal origin: Secondary | ICD-10-CM | POA: Diagnosis not present

## 2016-11-24 DIAGNOSIS — R7881 Bacteremia: Secondary | ICD-10-CM | POA: Diagnosis not present

## 2016-11-24 DIAGNOSIS — N186 End stage renal disease: Secondary | ICD-10-CM | POA: Diagnosis not present

## 2016-11-25 ENCOUNTER — Ambulatory Visit (INDEPENDENT_AMBULATORY_CARE_PROVIDER_SITE_OTHER): Payer: Medicare Other | Admitting: Pharmacist

## 2016-11-25 DIAGNOSIS — I4891 Unspecified atrial fibrillation: Secondary | ICD-10-CM | POA: Diagnosis not present

## 2016-11-25 DIAGNOSIS — Z5181 Encounter for therapeutic drug level monitoring: Secondary | ICD-10-CM

## 2016-11-25 DIAGNOSIS — I480 Paroxysmal atrial fibrillation: Secondary | ICD-10-CM | POA: Diagnosis not present

## 2016-11-25 DIAGNOSIS — I638 Other cerebral infarction: Secondary | ICD-10-CM

## 2016-11-25 DIAGNOSIS — G459 Transient cerebral ischemic attack, unspecified: Secondary | ICD-10-CM

## 2016-11-25 DIAGNOSIS — N186 End stage renal disease: Secondary | ICD-10-CM | POA: Diagnosis not present

## 2016-11-25 DIAGNOSIS — E1122 Type 2 diabetes mellitus with diabetic chronic kidney disease: Secondary | ICD-10-CM | POA: Diagnosis not present

## 2016-11-25 DIAGNOSIS — I132 Hypertensive heart and chronic kidney disease with heart failure and with stage 5 chronic kidney disease, or end stage renal disease: Secondary | ICD-10-CM | POA: Diagnosis not present

## 2016-11-25 DIAGNOSIS — I5042 Chronic combined systolic (congestive) and diastolic (congestive) heart failure: Secondary | ICD-10-CM | POA: Diagnosis not present

## 2016-11-25 DIAGNOSIS — D631 Anemia in chronic kidney disease: Secondary | ICD-10-CM | POA: Diagnosis not present

## 2016-11-25 LAB — POCT INR: INR: 2.6

## 2016-11-26 ENCOUNTER — Other Ambulatory Visit: Payer: Self-pay

## 2016-11-26 DIAGNOSIS — Z01812 Encounter for preprocedural laboratory examination: Secondary | ICD-10-CM

## 2016-11-26 DIAGNOSIS — N186 End stage renal disease: Secondary | ICD-10-CM | POA: Diagnosis not present

## 2016-11-26 DIAGNOSIS — N2581 Secondary hyperparathyroidism of renal origin: Secondary | ICD-10-CM | POA: Diagnosis not present

## 2016-11-26 DIAGNOSIS — R7881 Bacteremia: Secondary | ICD-10-CM | POA: Diagnosis not present

## 2016-11-26 DIAGNOSIS — D631 Anemia in chronic kidney disease: Secondary | ICD-10-CM | POA: Diagnosis not present

## 2016-11-26 DIAGNOSIS — E1122 Type 2 diabetes mellitus with diabetic chronic kidney disease: Secondary | ICD-10-CM | POA: Diagnosis not present

## 2016-11-28 DIAGNOSIS — E1122 Type 2 diabetes mellitus with diabetic chronic kidney disease: Secondary | ICD-10-CM | POA: Diagnosis not present

## 2016-11-28 DIAGNOSIS — N186 End stage renal disease: Secondary | ICD-10-CM | POA: Diagnosis not present

## 2016-11-28 DIAGNOSIS — R7881 Bacteremia: Secondary | ICD-10-CM | POA: Diagnosis not present

## 2016-11-28 DIAGNOSIS — N2581 Secondary hyperparathyroidism of renal origin: Secondary | ICD-10-CM | POA: Diagnosis not present

## 2016-11-28 DIAGNOSIS — D631 Anemia in chronic kidney disease: Secondary | ICD-10-CM | POA: Diagnosis not present

## 2016-11-30 ENCOUNTER — Ambulatory Visit (HOSPITAL_COMMUNITY)
Admission: RE | Admit: 2016-11-30 | Discharge: 2016-11-30 | Disposition: A | Discharge status: Home / Self Care | Payer: Medicare Other | Source: Ambulatory Visit | Attending: Surgery | Admitting: Surgery

## 2016-11-30 ENCOUNTER — Ambulatory Visit (INDEPENDENT_AMBULATORY_CARE_PROVIDER_SITE_OTHER)
Admission: RE | Admit: 2016-11-30 | Discharge: 2016-11-30 | Disposition: A | Discharge status: Home / Self Care | Payer: Medicare Other | Source: Ambulatory Visit | Attending: Surgery | Admitting: Surgery

## 2016-11-30 DIAGNOSIS — N186 End stage renal disease: Secondary | ICD-10-CM

## 2016-11-30 DIAGNOSIS — I132 Hypertensive heart and chronic kidney disease with heart failure and with stage 5 chronic kidney disease, or end stage renal disease: Secondary | ICD-10-CM | POA: Diagnosis not present

## 2016-11-30 DIAGNOSIS — Z01812 Encounter for preprocedural laboratory examination: Secondary | ICD-10-CM | POA: Diagnosis not present

## 2016-11-30 DIAGNOSIS — E1122 Type 2 diabetes mellitus with diabetic chronic kidney disease: Secondary | ICD-10-CM | POA: Diagnosis not present

## 2016-11-30 DIAGNOSIS — D631 Anemia in chronic kidney disease: Secondary | ICD-10-CM | POA: Diagnosis not present

## 2016-11-30 DIAGNOSIS — I480 Paroxysmal atrial fibrillation: Secondary | ICD-10-CM | POA: Diagnosis not present

## 2016-11-30 DIAGNOSIS — I5042 Chronic combined systolic (congestive) and diastolic (congestive) heart failure: Secondary | ICD-10-CM | POA: Diagnosis not present

## 2016-11-30 NOTE — Progress Notes (Signed)
Established Dialysis Access   History of Present Illness   Oscar Castillo is a 66 y.o. (09-22-1950) male who presents for re-evaluation for permanent access.  The patient is right hand dominant.  Previous access procedures have been completed in the left arm.  The patient's complication from previous access procedures include: infection and covered stent excision.  The patient has never had a previous PPM placed.   Past Medical History:  Diagnosis Date  . Anemia   . Antral ulcer 2014   small  . Atrial fibrillation with RVR ([place]) 10/07/2016  . BACK PAIN, LUMBAR, CHRONIC   . BENIGN PROSTATIC HYPERTROPHY   . Bipolar disorder ([place])    "sometimes" (10/07/2016)  . CHOLELITHIASIS   . Chronic combined systolic and diastolic CHF (congestive heart failure) ([place])   . Complication of anesthesia    wife states pt had trouble waking up with his last surgery in Nov., 2014  . CVA (cerebral vascular accident) ([place])   . DEPRESSION   . DIABETES MELLITUS, TYPE II    "not anymore" (10/07/2016)  . ERECTILE DYSFUNCTION   . ESRD (end stage renal disease) on dialysis [facility])    "[place]; TTS" (10/07/2016)  . ESRD on hemodialysis ([place])    ESRD due to DM/HTN. Started dialysis in November 2013.  HD TTS at [facility] on [address].  [name] GERD   . GI bleed    due to gastritis, discharged 10/02/16/notes 10/07/2016  . Headache    "monthly" (10/07/2016)  . Hemorrhoids   . Hepatitis C   . History of blood transfusion    "related to dialysis" (10/07/2016)  . History of Clostridium difficile   . History of kidney stones   . HYPERTENSION   . LBBB (left bundle branch block)   . Morbid obesity ([place])   . PAF (paroxysmal atrial fibrillation) ([place])    a. Dx 12/2015.    Past Surgical History:  Procedure Laterality Date  . AMPUTATION Left 05/12/2013   Procedure: AMPUTATION RAY;  Surgeon: Newt Minion, MD;  Location: [place];  Service: Orthopedics;  Laterality: Left;  Left Foot 1st Ray Amputation  . AMPUTATION Left  06/09/2013   Procedure: AMPUTATION BELOW KNEE;  Surgeon: Newt Minion, MD;  Location: [place];  Service: Orthopedics;  Laterality: Left;  Left Below Knee Amputation and removal proximal screws IM tibial nail  . AMPUTATION Right 09/08/2013   Procedure: AMPUTATION BELOW KNEE;  Surgeon: Newt Minion, MD;  Location: [place];  Service: Orthopedics;  Laterality: Right;  Right Below Knee Amputation  . AMPUTATION Right 10/11/2013   Procedure: AMPUTATION BELOW KNEE;  Surgeon: Newt Minion, MD;  Location: [place];  Service: Orthopedics;  Laterality: Right;  Right Below Knee Amputation Revision  . AV FISTULA PLACEMENT  06/14/2012   Procedure: ARTERIOVENOUS (AV) FISTULA CREATION;  Surgeon: Angelia Mould, MD;  Location: [facility] OR;  Service: Vascular;  Laterality: Left;  Left basilic vein transposition with fistula.  . COLONOSCOPY N/A 10/28/2012   Procedure: COLONOSCOPY;  Surgeon: Jeryl Columbia, MD;  Location: [facility] ENDOSCOPY;  Service: Endoscopy;  Laterality: N/A;  . COLONOSCOPY N/A 11/02/2012   Procedure: COLONOSCOPY;  Surgeon: Cleotis Nipper, MD;  Location: [facility] ENDOSCOPY;  Service: Endoscopy;  Laterality: N/A;  . COLONOSCOPY N/A 11/03/2012   Procedure: COLONOSCOPY;  Surgeon: Cleotis Nipper, MD;  Location: [facility] ENDOSCOPY;  Service: Endoscopy;  Laterality: N/A;  . COLONOSCOPY N/A 09/16/2016   Procedure: COLONOSCOPY;  Surgeon: Jerene Bears, MD;  Location: [facility] ENDOSCOPY;  Service: Gastroenterology;  Laterality: N/A;  . ENTEROSCOPY N/A 11/08/2012   Procedure: ENTEROSCOPY;  Surgeon: Wonda Horner, MD;  Location: [facility] ENDOSCOPY;  Service: Endoscopy;  Laterality: N/A;  . ESOPHAGOGASTRODUODENOSCOPY N/A 11/02/2012   Procedure: ESOPHAGOGASTRODUODENOSCOPY (EGD);  Surgeon: Cleotis Nipper, MD;  Location: [facility] ENDOSCOPY;  Service: Endoscopy;  Laterality: N/A;  . ESOPHAGOGASTRODUODENOSCOPY (EGD) WITH PROPOFOL N/A 09/16/2016   Procedure: ESOPHAGOGASTRODUODENOSCOPY (EGD) WITH PROPOFOL;  Surgeon: Jerene Bears, MD;  Location: [facility] ENDOSCOPY;   Service: Gastroenterology;  Laterality: N/A;  . EXCHANGE OF A DIALYSIS CATHETER Left 11/13/2016   Procedure: EXCHANGE OF A DIALYSIS CATHETER;  Surgeon: Elam Dutch, MD;  Location: [place];  Service: Vascular;  Laterality: Left;  . EYE SURGERY Left    to remove scar tissue  . FRACTURE SURGERY    . GIVENS CAPSULE STUDY N/A 11/04/2012   Procedure: GIVENS CAPSULE STUDY;  Surgeon: Cleotis Nipper, MD;  Location: [facility] ENDOSCOPY;  Service: Endoscopy;  Laterality: N/A;  . GIVENS CAPSULE STUDY N/A 09/29/2016   Procedure: GIVENS CAPSULE STUDY;  Surgeon: Manus Gunning, MD;  Location: [place];  Service: Gastroenterology;  Laterality: N/A;  try to keep pt up in bedside chair as much as possible during the study.    [name] HARDWARE REMOVAL Left 06/09/2013   Procedure: HARDWARE REMOVAL;  Surgeon: Newt Minion, MD;  Location: [place];  Service: Orthopedics;  Laterality: Left;  Left Below Knee Amputation  and Removal proximal screws IM tibial nail  . INSERTION OF DIALYSIS CATHETER N/A 11/09/2016   Procedure: INSERTION OF TEMPORARY DIALYSIS CATHETER;  Surgeon: Elam Dutch, MD;  Location: [place];  Service: Vascular;  Laterality: N/A;  . IR GENERIC HISTORICAL  09/27/2016   IR US GUIDE VASC ACCESS RIGHT 09/27/2016 Aletta Edouard, MD [facility]-INTER[facility] RAD  . IR GENERIC HISTORICAL  09/27/2016   IR FLUORO GUIDE CV LINE RIGHT 09/27/2016 Aletta Edouard, MD [facility]  . LIGATION OF ARTERIOVENOUS  FISTULA Left 11/09/2016   Procedure: LIGATION OF ARTERIOVENOUS  FISTULA;  Surgeon: Elam Dutch, MD;  Location: [place];  Service: Vascular;  Laterality: Left;  . NEPHRECTOMY     partial RR  . ORIF FIBULA FRACTURE Left 09/09/2012   Procedure: OPEN REDUCTION INTERNAL FIXATION (ORIF) FIBULA FRACTURE;  Surgeon: Johnny Bridge, MD;  Location: [place];  Service: Orthopedics;  Laterality: Left;  . TIBIA IM NAIL INSERTION Left 09/09/2012   Procedure: INTRAMEDULLARY (IM) NAIL TIBIAL;  Surgeon: Johnny Bridge, MD;  Location: [place];   Service: Orthopedics;  Laterality: Left;  left tibial nail and open reduction internal fixation left fibula fracture    Social History   Social History  . Marital status: Married    Spouse name: N/A  . Number of children: 2  . Years of education: 16   Occupational History  . disabled due to stroke Retired   Social History Main Topics  . Smoking status: Never Smoker  . Smokeless tobacco: Never Used  . Alcohol use No  . Drug use: No  . Sexual activity: Yes    Birth control/ protection: None   Other Topics Concern  . 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 Diabetes Mother   . Hypertension Mother   . Heart attack Father   . Hypertension Father   .  Coronary artery disease Other     Current Outpatient Prescriptions  Medication Sig Dispense Refill  . acetaminophen (TYLENOL) 500 MG tablet Take 500 mg by mouth every 6 (six) hours as needed for mild pain.    [name] amiodarone (PACERONE) 200 MG tablet TAKE 1 TABLET BY MOUTH DAILY, START THIS ON 11/06/16 90 tablet 3  . atorvastatin (LIPITOR) 40 MG tablet TAKE 1 TABLET(40 MG) BY MOUTH DAILY AT 6 PM 30 tablet 3  . calcium acetate (PHOSLO) 667 MG capsule Take 2,001 mg by mouth 3 (three) times daily with meals.    . latanoprost (XALATAN) 0.005 % ophthalmic solution Place 1 drop into both eyes at bedtime.     . lidocaine-prilocaine (EMLA) cream APPLY A SMALL AMOUNT TO SKIN AT THE ACCESS SITE (AVF) AS DIRECTED BEFORE EACH DIALYSIS SESSION THREE DAYS A WEEK  6  . pantoprazole (PROTONIX) 40 MG tablet Take 1 tablet (40 mg total) by mouth 2 (two) times daily before a meal. 60 tablet 11  . QUEtiapine (SEROQUEL) 25 MG tablet Take 1 tablet (25 mg total) by mouth 2 (two) times daily. 60 tablet 5  . SENSIPAR 30 MG tablet Take 30 mg by mouth every evening.     . silver sulfADIAZINE (SILVADENE) 1 % cream Apply 1  application topically daily as needed (irratation).    . warfarin (COUMADIN) 2.5 MG tablet Take 2.5mg  nightly, check INR on 10/05/2016 (Patient taking differently: Take 2.5-3.75 mg by mouth daily. Take 2.5 mg on Friday, Sunday and Tuesday.  all other days take 3.75 mg.) 30 tablet 0   No current facility-administered medications for this visit.      No Known Allergies   REVIEW OF SYSTEMS:  (Positives checked otherwise negative)  CARDIOVASCULAR:   [ ]  chest pain,  [ ]  chest pressure,  [ ]  palpitations,  [ ]  shortness of breath when laying flat,  [ ]  shortness of breath with exertion,   [x]  pain in hips when walking,  [ ]  pain in feet when laying flat, [ ]  history of blood clot in veins (DVT),  [ ]  history of phlebitis,  [ ]  swelling in legs,  [ ]  varicose veins  PULMONARY:   [ ]  productive cough,  [ ]  asthma,  [ ]  wheezing  NEUROLOGIC:   [ ]  weakness in arms or legs,  [ ]  numbness in arms or legs,  [ ]  difficulty speaking or slurred speech,  [ ]  temporary loss of vision in one eye,  [ ]  dizziness  HEMATOLOGIC:   [ ]  bleeding problems,  [ ]  problems with blood clotting too easily  MUSCULOSKEL:   [ ]  joint pain, [ ]  joint swelling  GASTROINTEST:   [ ]  vomiting blood,  [ ]  blood in stool     GENITOURINARY:   [ ]  burning with urination,  [ ]  blood in urine  PSYCHIATRIC:   [ ]  history of major depression  INTEGUMENTARY:   [ ]  rashes,  [ ]  ulcers  CONSTITUTIONAL:   [ ]  fever,  [ ]  chills    Physical Examination   Vitals:   12/02/16 1016 12/02/16 1019  BP: (!) 150/96 (!) 131/91  Pulse: 69 69  Resp: 18   Temp: 98.2 F (36.8 C)   SpO2: 99%   Weight: 210 lb (95.3 kg)   Height: 5\' 10"  (1.778 m)     Body mass index is 30.13 kg/m.  General Alert, O x 3, WD, NAD  Pulmonary Sym exp, good B  air movt, CTA B  Cardiac RRR, Nl S1, S2, no Murmurs, No rubs, No S3,S4  Vascular Vessel Right Left  Radial Faintly palpable Faintly palpable  Ulnar Not palpable  Not palpable  Brachial Palpable Palpable    Gastrointestinal soft, non-distended, non-tender to palpation, No guarding or rebound, no HSM, no masses, no CVAT B, No palpable prominent aortic pulse,    Musculoskeletal M/S 5/5 throughout except BLE due to BKA, Extremities without ischemic changes except B BKA, healed L arm incisions  Neurologic Pain and light touch intact in extremities except for decreased sensation in B BKA, Motor exam as listed above    Non-Invasive Vascular Imaging   BUE Doppler (12/02/2016):   R arm:   Brachial: tri, 5.4 mm  Radial: tri, 1.9 mm  Ulnar: tri, 1.4 mm  L arm:   Brachial: tri, 4.5 mm  R Vein Mapping  (12/02/2016):   R arm: acceptable vein conduits include none   Medical Decision Making   Oscar Castillo is a 66 y.o. male who presents with ESRD requiring hemodialysis, s/p L covered stent excision and L AVF excision.    Pt wants to avoid using his R arm.  There was no L arm vein mapping and there is a history of L cover stent excision.  Regardless in this situation, I would get: B arm and central venogram Risk of extremity and central venography include but are not limited to: anaphylaxis, bleeding, infection, and incomplete visualization of the venous system.  The patient has agreed to proceed with the above procedure which will be scheduled 21 MAY 18.   Adele Barthel, MD, FACS [facility] [place] Office: [phone] Pager: [phone]

## 2016-12-01 DIAGNOSIS — D631 Anemia in chronic kidney disease: Secondary | ICD-10-CM | POA: Diagnosis not present

## 2016-12-01 DIAGNOSIS — N186 End stage renal disease: Secondary | ICD-10-CM | POA: Diagnosis not present

## 2016-12-01 DIAGNOSIS — E1122 Type 2 diabetes mellitus with diabetic chronic kidney disease: Secondary | ICD-10-CM | POA: Diagnosis not present

## 2016-12-01 DIAGNOSIS — N2581 Secondary hyperparathyroidism of renal origin: Secondary | ICD-10-CM | POA: Diagnosis not present

## 2016-12-01 DIAGNOSIS — R7881 Bacteremia: Secondary | ICD-10-CM | POA: Diagnosis not present

## 2016-12-02 ENCOUNTER — Other Ambulatory Visit: Payer: Self-pay

## 2016-12-02 ENCOUNTER — Other Ambulatory Visit: Payer: Self-pay | Admitting: Internal Medicine

## 2016-12-02 ENCOUNTER — Other Ambulatory Visit (INDEPENDENT_AMBULATORY_CARE_PROVIDER_SITE_OTHER): Payer: Self-pay | Admitting: Orthopedic Surgery

## 2016-12-02 ENCOUNTER — Ambulatory Visit (INDEPENDENT_AMBULATORY_CARE_PROVIDER_SITE_OTHER): Payer: Medicare Other | Admitting: Cardiology

## 2016-12-02 ENCOUNTER — Ambulatory Visit (INDEPENDENT_AMBULATORY_CARE_PROVIDER_SITE_OTHER): Payer: Self-pay | Admitting: Vascular Surgery

## 2016-12-02 VITALS — BP 131/91 | HR 69 | Temp 98.2°F | Resp 18 | Ht 70.0 in | Wt 210.0 lb

## 2016-12-02 DIAGNOSIS — I132 Hypertensive heart and chronic kidney disease with heart failure and with stage 5 chronic kidney disease, or end stage renal disease: Secondary | ICD-10-CM | POA: Diagnosis not present

## 2016-12-02 DIAGNOSIS — I5042 Chronic combined systolic (congestive) and diastolic (congestive) heart failure: Secondary | ICD-10-CM | POA: Diagnosis not present

## 2016-12-02 DIAGNOSIS — N186 End stage renal disease: Secondary | ICD-10-CM | POA: Diagnosis not present

## 2016-12-02 DIAGNOSIS — E1122 Type 2 diabetes mellitus with diabetic chronic kidney disease: Secondary | ICD-10-CM | POA: Diagnosis not present

## 2016-12-02 DIAGNOSIS — Z992 Dependence on renal dialysis: Secondary | ICD-10-CM

## 2016-12-02 DIAGNOSIS — D631 Anemia in chronic kidney disease: Secondary | ICD-10-CM | POA: Diagnosis not present

## 2016-12-02 DIAGNOSIS — G459 Transient cerebral ischemic attack, unspecified: Secondary | ICD-10-CM

## 2016-12-02 DIAGNOSIS — I480 Paroxysmal atrial fibrillation: Secondary | ICD-10-CM | POA: Diagnosis not present

## 2016-12-02 DIAGNOSIS — Z5181 Encounter for therapeutic drug level monitoring: Secondary | ICD-10-CM

## 2016-12-02 LAB — POCT INR: INR: 4.7

## 2016-12-03 ENCOUNTER — Telehealth: Payer: Self-pay | Admitting: *Deleted

## 2016-12-03 ENCOUNTER — Telehealth: Payer: Self-pay | Admitting: Pharmacist

## 2016-12-03 DIAGNOSIS — N2581 Secondary hyperparathyroidism of renal origin: Secondary | ICD-10-CM | POA: Diagnosis not present

## 2016-12-03 DIAGNOSIS — D631 Anemia in chronic kidney disease: Secondary | ICD-10-CM | POA: Diagnosis not present

## 2016-12-03 DIAGNOSIS — N186 End stage renal disease: Secondary | ICD-10-CM | POA: Diagnosis not present

## 2016-12-03 DIAGNOSIS — R7881 Bacteremia: Secondary | ICD-10-CM | POA: Diagnosis not present

## 2016-12-03 DIAGNOSIS — E1122 Type 2 diabetes mellitus with diabetic chronic kidney disease: Secondary | ICD-10-CM | POA: Diagnosis not present

## 2016-12-03 NOTE — Telephone Encounter (Signed)
Pt is scheduled for Bilateral upper extremity venography and central venography on May 21st Kay states Dr Bridgett Larsson likes coumadin to be held 5 days and clearance form was sent to Dr Harrington Challenger yesterday. Also requested clearance be sent to our office as well . Will need to call pt with updated information when clearance obtained

## 2016-12-03 NOTE — Telephone Encounter (Signed)
Received clearance request from [facility] for pt to hold Coumadin for 5 days prior to bilateral upper extremity and central venogram scheduled for 12/14/16. Pt takes Coumadin for afib with CHADS2 score of 5 (CHF, HTN, DM, TIA). Pt will require Lovenox bridging to hold Coumadin for 5 days. Will coordinate in Coumadin clinic. Clearance faxed back to [facility] [phone]. Pt made aware and will come into clinic on 5/14 to coordinate Lovenox bridge.

## 2016-12-05 DIAGNOSIS — N186 End stage renal disease: Secondary | ICD-10-CM | POA: Diagnosis not present

## 2016-12-05 DIAGNOSIS — E1122 Type 2 diabetes mellitus with diabetic chronic kidney disease: Secondary | ICD-10-CM | POA: Diagnosis not present

## 2016-12-05 DIAGNOSIS — N2581 Secondary hyperparathyroidism of renal origin: Secondary | ICD-10-CM | POA: Diagnosis not present

## 2016-12-05 DIAGNOSIS — R7881 Bacteremia: Secondary | ICD-10-CM | POA: Diagnosis not present

## 2016-12-05 DIAGNOSIS — D631 Anemia in chronic kidney disease: Secondary | ICD-10-CM | POA: Diagnosis not present

## 2016-12-07 ENCOUNTER — Ambulatory Visit (INDEPENDENT_AMBULATORY_CARE_PROVIDER_SITE_OTHER): Payer: Medicare Other | Admitting: *Deleted

## 2016-12-07 DIAGNOSIS — G459 Transient cerebral ischemic attack, unspecified: Secondary | ICD-10-CM

## 2016-12-07 DIAGNOSIS — Z5181 Encounter for therapeutic drug level monitoring: Secondary | ICD-10-CM

## 2016-12-07 DIAGNOSIS — I638 Other cerebral infarction: Secondary | ICD-10-CM

## 2016-12-07 DIAGNOSIS — I4891 Unspecified atrial fibrillation: Secondary | ICD-10-CM

## 2016-12-07 LAB — POCT INR: INR: 2.9

## 2016-12-07 MED ORDER — ENOXAPARIN SODIUM 100 MG/ML ~~LOC~~ SOLN: 100.0000 mg | Freq: Two times a day (BID) | SUBCUTANEOUS | 1 refills | Status: DC

## 2016-12-07 NOTE — Patient Instructions (Addendum)
12/08/16- Last dose of Coumadin.  12/09/16- No Coumadin or Lovenox.  12/10/16- Inject Lovenox 100 mg in the fatty abdominal tissue at least 2 inches from the belly button once a day about 24 hours apart at 8pm rotate sites. No Coumadin.  12/11/16- Inject Lovenox in the fatty tissue every 24 hours at 8pm.  No Coumadin.  12/12/16- No Lovenox and  No Coumadin.  12/13/16-  No Lovenox and  No Coumadin.  12/14/16- Procedure Day - No Lovenox - Resume Coumadin in the evening or as directed by doctor (take an extra half tablet with usual dose for 2 days then resume normal dose).  12/15/16-Resume Lovenox inject in the fatty tissue every 24 hours, start at 8am and take Coumadin.  12/16/16- Inject Lovenox in the fatty tissue every 24 hours at 8am and take Coumadin.  12/17/16-  Inject Lovenox in the fatty tissue every 24 hours at 8am and take Coumadin.  12/18/16- Inject Lovenox in the fatty tissue every 24 hours at 8am and take Coumadin.

## 2016-12-08 DIAGNOSIS — D631 Anemia in chronic kidney disease: Secondary | ICD-10-CM | POA: Diagnosis not present

## 2016-12-08 DIAGNOSIS — N186 End stage renal disease: Secondary | ICD-10-CM | POA: Diagnosis not present

## 2016-12-08 DIAGNOSIS — N2581 Secondary hyperparathyroidism of renal origin: Secondary | ICD-10-CM | POA: Diagnosis not present

## 2016-12-08 DIAGNOSIS — E1122 Type 2 diabetes mellitus with diabetic chronic kidney disease: Secondary | ICD-10-CM | POA: Diagnosis not present

## 2016-12-08 DIAGNOSIS — R7881 Bacteremia: Secondary | ICD-10-CM | POA: Diagnosis not present

## 2016-12-09 DIAGNOSIS — N186 End stage renal disease: Secondary | ICD-10-CM | POA: Diagnosis not present

## 2016-12-09 DIAGNOSIS — I132 Hypertensive heart and chronic kidney disease with heart failure and with stage 5 chronic kidney disease, or end stage renal disease: Secondary | ICD-10-CM | POA: Diagnosis not present

## 2016-12-09 DIAGNOSIS — D631 Anemia in chronic kidney disease: Secondary | ICD-10-CM | POA: Diagnosis not present

## 2016-12-09 DIAGNOSIS — I5042 Chronic combined systolic (congestive) and diastolic (congestive) heart failure: Secondary | ICD-10-CM | POA: Diagnosis not present

## 2016-12-09 DIAGNOSIS — I480 Paroxysmal atrial fibrillation: Secondary | ICD-10-CM | POA: Diagnosis not present

## 2016-12-09 DIAGNOSIS — E1122 Type 2 diabetes mellitus with diabetic chronic kidney disease: Secondary | ICD-10-CM | POA: Diagnosis not present

## 2016-12-10 ENCOUNTER — Telehealth: Payer: Self-pay | Admitting: Internal Medicine

## 2016-12-10 DIAGNOSIS — D631 Anemia in chronic kidney disease: Secondary | ICD-10-CM | POA: Diagnosis not present

## 2016-12-10 DIAGNOSIS — R7881 Bacteremia: Secondary | ICD-10-CM | POA: Diagnosis not present

## 2016-12-10 DIAGNOSIS — N2581 Secondary hyperparathyroidism of renal origin: Secondary | ICD-10-CM | POA: Diagnosis not present

## 2016-12-10 DIAGNOSIS — N186 End stage renal disease: Secondary | ICD-10-CM | POA: Diagnosis not present

## 2016-12-10 DIAGNOSIS — E1122 Type 2 diabetes mellitus with diabetic chronic kidney disease: Secondary | ICD-10-CM | POA: Diagnosis not present

## 2016-12-10 NOTE — Telephone Encounter (Signed)
Ok for verbals 

## 2016-12-10 NOTE — Telephone Encounter (Signed)
Needs verbals for reverification for home physcial therapy  1x1 2x4

## 2016-12-10 NOTE — Telephone Encounter (Signed)
Verbals were given to [facility]

## 2016-12-11 ENCOUNTER — Other Ambulatory Visit: Payer: Self-pay | Admitting: *Deleted

## 2016-12-11 DIAGNOSIS — K922 Gastrointestinal hemorrhage, unspecified: Secondary | ICD-10-CM | POA: Diagnosis not present

## 2016-12-11 DIAGNOSIS — Z89511 Acquired absence of right leg below knee: Secondary | ICD-10-CM | POA: Diagnosis not present

## 2016-12-11 DIAGNOSIS — I480 Paroxysmal atrial fibrillation: Secondary | ICD-10-CM | POA: Diagnosis not present

## 2016-12-11 DIAGNOSIS — F329 Major depressive disorder, single episode, unspecified: Secondary | ICD-10-CM | POA: Diagnosis not present

## 2016-12-11 DIAGNOSIS — I132 Hypertensive heart and chronic kidney disease with heart failure and with stage 5 chronic kidney disease, or end stage renal disease: Secondary | ICD-10-CM | POA: Diagnosis not present

## 2016-12-11 DIAGNOSIS — I5042 Chronic combined systolic (congestive) and diastolic (congestive) heart failure: Secondary | ICD-10-CM | POA: Diagnosis not present

## 2016-12-11 DIAGNOSIS — K219 Gastro-esophageal reflux disease without esophagitis: Secondary | ICD-10-CM | POA: Diagnosis not present

## 2016-12-11 DIAGNOSIS — Z89512 Acquired absence of left leg below knee: Secondary | ICD-10-CM | POA: Diagnosis not present

## 2016-12-11 DIAGNOSIS — Z992 Dependence on renal dialysis: Secondary | ICD-10-CM | POA: Diagnosis not present

## 2016-12-11 DIAGNOSIS — Z993 Dependence on wheelchair: Secondary | ICD-10-CM | POA: Diagnosis not present

## 2016-12-11 DIAGNOSIS — N186 End stage renal disease: Secondary | ICD-10-CM | POA: Diagnosis not present

## 2016-12-11 DIAGNOSIS — Z7901 Long term (current) use of anticoagulants: Secondary | ICD-10-CM | POA: Diagnosis not present

## 2016-12-11 DIAGNOSIS — Z8673 Personal history of transient ischemic attack (TIA), and cerebral infarction without residual deficits: Secondary | ICD-10-CM | POA: Diagnosis not present

## 2016-12-11 DIAGNOSIS — D631 Anemia in chronic kidney disease: Secondary | ICD-10-CM | POA: Diagnosis not present

## 2016-12-11 DIAGNOSIS — E1122 Type 2 diabetes mellitus with diabetic chronic kidney disease: Secondary | ICD-10-CM | POA: Diagnosis not present

## 2016-12-12 DIAGNOSIS — N186 End stage renal disease: Secondary | ICD-10-CM | POA: Diagnosis not present

## 2016-12-12 DIAGNOSIS — E1122 Type 2 diabetes mellitus with diabetic chronic kidney disease: Secondary | ICD-10-CM | POA: Diagnosis not present

## 2016-12-12 DIAGNOSIS — N2581 Secondary hyperparathyroidism of renal origin: Secondary | ICD-10-CM | POA: Diagnosis not present

## 2016-12-12 DIAGNOSIS — D631 Anemia in chronic kidney disease: Secondary | ICD-10-CM | POA: Diagnosis not present

## 2016-12-12 DIAGNOSIS — R7881 Bacteremia: Secondary | ICD-10-CM | POA: Diagnosis not present

## 2016-12-14 ENCOUNTER — Encounter (HOSPITAL_COMMUNITY): Payer: Self-pay | Admitting: Vascular Surgery

## 2016-12-14 ENCOUNTER — Encounter (HOSPITAL_COMMUNITY)
Admission: RE | Disposition: A | Discharge status: Home / Self Care | Payer: Self-pay | Source: Ambulatory Visit | Attending: Vascular Surgery

## 2016-12-14 ENCOUNTER — Ambulatory Visit (HOSPITAL_COMMUNITY)
Admission: RE | Admit: 2016-12-14 | Discharge: 2016-12-14 | Disposition: A | Discharge status: Home / Self Care | Payer: Medicare Other | Source: Ambulatory Visit | Attending: Vascular Surgery | Admitting: Vascular Surgery

## 2016-12-14 DIAGNOSIS — N400 Enlarged prostate without lower urinary tract symptoms: Secondary | ICD-10-CM | POA: Diagnosis not present

## 2016-12-14 DIAGNOSIS — Z905 Acquired absence of kidney: Secondary | ICD-10-CM | POA: Diagnosis not present

## 2016-12-14 DIAGNOSIS — M549 Dorsalgia, unspecified: Secondary | ICD-10-CM | POA: Insufficient documentation

## 2016-12-14 DIAGNOSIS — E1122 Type 2 diabetes mellitus with diabetic chronic kidney disease: Secondary | ICD-10-CM | POA: Insufficient documentation

## 2016-12-14 DIAGNOSIS — G8929 Other chronic pain: Secondary | ICD-10-CM | POA: Insufficient documentation

## 2016-12-14 DIAGNOSIS — I5042 Chronic combined systolic (congestive) and diastolic (congestive) heart failure: Secondary | ICD-10-CM | POA: Diagnosis not present

## 2016-12-14 DIAGNOSIS — Z992 Dependence on renal dialysis: Secondary | ICD-10-CM | POA: Insufficient documentation

## 2016-12-14 DIAGNOSIS — N185 Chronic kidney disease, stage 5: Secondary | ICD-10-CM | POA: Diagnosis not present

## 2016-12-14 DIAGNOSIS — B1920 Unspecified viral hepatitis C without hepatic coma: Secondary | ICD-10-CM | POA: Diagnosis not present

## 2016-12-14 DIAGNOSIS — Z7901 Long term (current) use of anticoagulants: Secondary | ICD-10-CM | POA: Diagnosis not present

## 2016-12-14 DIAGNOSIS — K219 Gastro-esophageal reflux disease without esophagitis: Secondary | ICD-10-CM | POA: Insufficient documentation

## 2016-12-14 DIAGNOSIS — Z8249 Family history of ischemic heart disease and other diseases of the circulatory system: Secondary | ICD-10-CM | POA: Diagnosis not present

## 2016-12-14 DIAGNOSIS — F319 Bipolar disorder, unspecified: Secondary | ICD-10-CM | POA: Insufficient documentation

## 2016-12-14 DIAGNOSIS — M542 Cervicalgia: Secondary | ICD-10-CM

## 2016-12-14 DIAGNOSIS — I132 Hypertensive heart and chronic kidney disease with heart failure and with stage 5 chronic kidney disease, or end stage renal disease: Secondary | ICD-10-CM | POA: Insufficient documentation

## 2016-12-14 DIAGNOSIS — Z89511 Acquired absence of right leg below knee: Secondary | ICD-10-CM | POA: Diagnosis not present

## 2016-12-14 DIAGNOSIS — Z89512 Acquired absence of left leg below knee: Secondary | ICD-10-CM | POA: Insufficient documentation

## 2016-12-14 DIAGNOSIS — N186 End stage renal disease: Secondary | ICD-10-CM | POA: Diagnosis not present

## 2016-12-14 DIAGNOSIS — D631 Anemia in chronic kidney disease: Secondary | ICD-10-CM | POA: Diagnosis not present

## 2016-12-14 DIAGNOSIS — Z6830 Body mass index (BMI) 30.0-30.9, adult: Secondary | ICD-10-CM | POA: Diagnosis not present

## 2016-12-14 DIAGNOSIS — I447 Left bundle-branch block, unspecified: Secondary | ICD-10-CM | POA: Diagnosis not present

## 2016-12-14 DIAGNOSIS — I480 Paroxysmal atrial fibrillation: Secondary | ICD-10-CM | POA: Insufficient documentation

## 2016-12-14 HISTORY — PX: UPPER EXTREMITY VENOGRAPHY: CATH118272

## 2016-12-14 LAB — POCT I-STAT, CHEM 8
BUN: 75 mg/dL — AB (ref 6–20)
CREATININE: 10.1 mg/dL — AB (ref 0.61–1.24)
Calcium, Ion: 1 mmol/L — ABNORMAL LOW (ref 1.15–1.40)
Chloride: 101 mmol/L (ref 101–111)
Glucose, Bld: 103 mg/dL — ABNORMAL HIGH (ref 65–99)
HCT: 45 % (ref 39.0–52.0)
Hemoglobin: 15.3 g/dL (ref 13.0–17.0)
Potassium: 6.2 mmol/L — ABNORMAL HIGH (ref 3.5–5.1)
Sodium: 137 mmol/L (ref 135–145)
TCO2: 29 mmol/L (ref 0–100)

## 2016-12-14 LAB — PROTIME-INR
INR: 1.57
PROTHROMBIN TIME: 19 s — AB (ref 11.4–15.2)

## 2016-12-14 LAB — POTASSIUM: Potassium: 5.2 mmol/L — ABNORMAL HIGH (ref 3.5–5.1)

## 2016-12-14 SURGERY — UPPER EXTREMITY VENOGRAPHY
Anesthesia: LOCAL

## 2016-12-14 MED ORDER — SODIUM CHLORIDE 0.9 % IV SOLN: 250.0000 mL | INTRAVENOUS | Status: DC | PRN

## 2016-12-14 MED ORDER — HEPARIN (PORCINE) IN NACL 2-0.9 UNIT/ML-% IJ SOLN
INTRAMUSCULAR | Status: AC
  Filled 2016-12-14: qty 500

## 2016-12-14 MED ORDER — SODIUM CHLORIDE 0.9% FLUSH: 3.0000 mL | INTRAVENOUS | Status: DC | PRN

## 2016-12-14 MED ORDER — ACETAMINOPHEN 325 MG PO TABS: 650.0000 mg | ORAL_TABLET | ORAL | Status: DC | PRN

## 2016-12-14 MED ORDER — IODIXANOL 320 MG/ML IV SOLN
INTRAVENOUS | Status: DC | PRN
Start: 2016-12-14 — End: 2016-12-14
  Administered 2016-12-14: 70 mL via INTRAVENOUS

## 2016-12-14 MED ORDER — HEPARIN (PORCINE) IN NACL 2-0.9 UNIT/ML-% IJ SOLN
INTRAMUSCULAR | Status: AC | PRN
  Administered 2016-12-14: 500 mL

## 2016-12-14 MED ORDER — SODIUM CHLORIDE 0.9% FLUSH: 3.0000 mL | Freq: Two times a day (BID) | INTRAVENOUS | Status: DC

## 2016-12-14 SURGICAL SUPPLY — 2 items
PROTECTION STATION PRESSURIZED (MISCELLANEOUS) ×2
STATION PROTECTION PRESSURIZED (MISCELLANEOUS) IMPLANT

## 2016-12-14 NOTE — Discharge Instructions (Signed)
Venogram, Care After °This sheet gives you information about how to care for yourself after your procedure. Your health care provider may also give you more specific instructions. If you have problems or questions, contact your health care provider. °What can I expect after the procedure? °After the procedure, it is common to have: °· Bruising or mild discomfort in the area where the IV was inserted (insertion site). ° °Follow these instructions at home: °Eating and drinking °· Follow instructions from your health care provider about eating or drinking restrictions. °· Drink a lot of fluids for the first several days after the procedure, as directed by your health care provider. This helps to wash (flush) the contrast out of your body. Examples of healthy fluids include water or low-calorie drinks. °General instructions °· Check your IV insertion area every day for signs of infection. Check for: °? Redness, swelling, or pain. °? Fluid or blood. °? Warmth. °? Pus or a bad smell. °· Take over-the-counter and prescription medicines only as told by your health care provider. °· Rest and return to your normal activities as told by your health care provider. Ask your health care provider what activities are safe for you. °· Do not drive for 24 hours if you were given a medicine to help you relax (sedative), or until your health care provider approves. °· Keep all follow-up visits as told by your health care provider. This is important. °Contact a health care provider if: °· Your skin becomes itchy or you develop a rash or hives. °· You have a fever that does not get better with medicine. °· You feel nauseous. °· You vomit. °· You have redness, swelling, or pain around the insertion site. °· You have fluid or blood coming from the insertion site. °· Your insertion area feels warm to the touch. °· You have pus or a bad smell coming from the insertion site. °Get help right away if: °· You have difficulty breathing or  shortness of breath. °· You develop chest pain. °· You faint. °· You feel very dizzy. °These symptoms may represent a serious problem that is an emergency. Do not wait to see if the symptoms will go away. Get medical help right away. Call your local emergency services (911 in the [place] Do not drive yourself to the hospital. °Summary °· After your procedure, it is common to have bruising or mild discomfort in the area where the IV was inserted. °· You should check your IV insertion area every day for signs of infection. °· Take over-the-counter and prescription medicines only as told by your health care provider. °· You should drink a lot of fluids for the first several days after the procedure to help flush the contrast from your body. °This information is not intended to replace advice given to you by your health care provider. Make sure you discuss any questions you have with your health care provider. °Document Released: 05/03/2013 Document Revised: 06/06/2016 Document Reviewed: 06/06/2016 °Elsevier Interactive Patient Education © 2017 [employer] ° °

## 2016-12-14 NOTE — Op Note (Signed)
    OPERATIVE NOTE   PROCEDURE: 1.  bilateral arm and central venogram   PRE-OPERATIVE DIAGNOSIS: end stage renal disease  POST-OPERATIVE DIAGNOSIS: same as above   SURGEON: Adele Barthel, MD  ANESTHESIA: local  ESTIMATED BLOOD LOSS: 5 cc  FINDING(S): 1.  Right arm:  Cephalic vein miniscule  Paired brachial veins: distally 3-4 mm, mid-segment 2-2.5 mm, proximal axillary vein: >5 mm  Patent central venous structures 2.  Left arm:  Small cephalic vein  Patent single brachial: 4-5 mmm join subclavian vein in chest  Patent axillary artery >5 mm, appears to be deep in axilla  Paten central venous structures despite left internal jugular vein tunneled dialysis catheter   SPECIMEN(S):  None  CONTRAST: 70 cc  INDICATIONS: Oscar Castillo is a 66 y.o. male who presents with end stage renal disease.  The patient is scheduled for bilateral arm and central venogram to help determine the availability of proximal veins for permanent access placement.  The patient is aware the risks include but are not limited to: bleeding, infection, thrombosis of the cannulated access, and possible anaphylactic reaction to the contrast.  The patient is aware of the risks of the procedure and elects to proceed forward.   DESCRIPTION: After full informed written consent was obtained, the patient was brought back to the angiography suite and placed supine upon the angiography table.  The patient was connected to monitoring equipment.  The right antecubital IV was connected to IV extension tubing.  Hand injections were completed to image the arm veins and central venous structures, the findings of which are listed above.    The left forearm IV was connected to IV extension tubing.  Hand injections were completed to image the arm veins and central venous structures, the findings of which are listed above.    Based on the images, I think it is reasonable to attempt a staged left brachial vein transposition  vs left upper arm arteriovenous graft with hybrid Gore graft.   COMPLICATIONS: none  CONDITION: stable   Adele Barthel, MD, [facility] [place] Office: [phone] Pager: [phone]  12/14/2016 10:35 AM

## 2016-12-14 NOTE — Progress Notes (Signed)
Anderson Malta spoke with Dr. Scot Dock, instructed to place IVs in both arms which was done.

## 2016-12-14 NOTE — Interval H&P Note (Signed)
History and Physical Interval Note:  12/14/2016 9:26 AM  Oscar Castillo  has presented today for surgery, with the diagnosis of ESRD  The various methods of treatment have been discussed with the patient and family. After consideration of risks, benefits and other options for treatment, the patient has consented to  Procedure(s): Bilateral Upper Extremity Venography and Central Venography (N/A) as a surgical intervention .  The patient's history has been reviewed, patient examined, no change in status, stable for surgery.  I have reviewed the patient's chart and labs.  Questions were answered to the patient's satisfaction.     Adele Barthel

## 2016-12-14 NOTE — H&P (View-Only) (Signed)
Established Dialysis Access   History of Present Illness   Oscar Castillo is a 65 y.o. ([birth year]) male who presents for re-evaluation for permanent access.  The patient is right hand dominant.  Previous access procedures have been completed in the left arm.  The patient's complication from previous access procedures include: infection and covered stent excision.  The patient has never had a previous PPM placed.   Past Medical History:  Diagnosis Date  . Anemia   . Antral ulcer 2014   small  . Atrial fibrillation with RVR ([place]) 10/07/2016  . BACK PAIN, LUMBAR, CHRONIC   . BENIGN PROSTATIC HYPERTROPHY   . Bipolar disorder ([place])    "sometimes" (10/07/2016)  . CHOLELITHIASIS   . Chronic combined systolic and diastolic CHF (congestive heart failure) ([place])   . Complication of anesthesia    wife states pt had trouble waking up with his last surgery in Nov., 2014  . CVA (cerebral vascular accident) ([place])   . DEPRESSION   . DIABETES MELLITUS, TYPE II    "not anymore" (10/07/2016)  . ERECTILE DYSFUNCTION   . ESRD (end stage renal disease) on dialysis [facility])    "[place]; TTS" (10/07/2016)  . ESRD on hemodialysis ([place])    ESRD due to DM/HTN. Started dialysis in November 2013.  HD TTS at [facility] on [address].  [name] GERD   . GI bleed    due to gastritis, discharged 10/02/16/notes 10/07/2016  . Headache    "monthly" (10/07/2016)  . Hemorrhoids   . Hepatitis C   . History of blood transfusion    "related to dialysis" (10/07/2016)  . History of Clostridium difficile   . History of kidney stones   . HYPERTENSION   . LBBB (left bundle branch block)   . Morbid obesity ([place])   . PAF (paroxysmal atrial fibrillation) ([place])    a. Dx 12/2015.    Past Surgical History:  Procedure Laterality Date  . AMPUTATION Left 05/12/2013   Procedure: AMPUTATION RAY;  Surgeon: Newt Minion, MD;  Location: [place];  Service: Orthopedics;  Laterality: Left;  Left Foot 1st Ray Amputation  . AMPUTATION Left  06/09/2013   Procedure: AMPUTATION BELOW KNEE;  Surgeon: Newt Minion, MD;  Location: [place];  Service: Orthopedics;  Laterality: Left;  Left Below Knee Amputation and removal proximal screws IM tibial nail  . AMPUTATION Right 09/08/2013   Procedure: AMPUTATION BELOW KNEE;  Surgeon: Newt Minion, MD;  Location: [place];  Service: Orthopedics;  Laterality: Right;  Right Below Knee Amputation  . AMPUTATION Right 10/11/2013   Procedure: AMPUTATION BELOW KNEE;  Surgeon: Newt Minion, MD;  Location: [place];  Service: Orthopedics;  Laterality: Right;  Right Below Knee Amputation Revision  . AV FISTULA PLACEMENT  06/14/2012   Procedure: ARTERIOVENOUS (AV) FISTULA CREATION;  Surgeon: Angelia Mould, MD;  Location: [facility] OR;  Service: Vascular;  Laterality: Left;  Left basilic vein transposition with fistula.  . COLONOSCOPY N/A 10/28/2012   Procedure: COLONOSCOPY;  Surgeon: Jeryl [place], MD;  Location: [facility] ENDOSCOPY;  Service: Endoscopy;  Laterality: N/A;  . COLONOSCOPY N/A 11/02/2012   Procedure: COLONOSCOPY;  Surgeon: Cleotis Nipper, MD;  Location: [facility] ENDOSCOPY;  Service: Endoscopy;  Laterality: N/A;  . COLONOSCOPY N/A 11/03/2012   Procedure: COLONOSCOPY;  Surgeon: Cleotis Nipper, MD;  Location: [facility] ENDOSCOPY;  Service: Endoscopy;  Laterality: N/A;  . COLONOSCOPY N/A 09/16/2016   Procedure: COLONOSCOPY;  Surgeon: Jerene Bears, MD;  Location: [facility] ENDOSCOPY;  Service: Gastroenterology;  Laterality: N/A;  . ENTEROSCOPY N/A 11/08/2012   Procedure: ENTEROSCOPY;  Surgeon: Wonda Horner, MD;  Location: [facility] ENDOSCOPY;  Service: Endoscopy;  Laterality: N/A;  . ESOPHAGOGASTRODUODENOSCOPY N/A 11/02/2012   Procedure: ESOPHAGOGASTRODUODENOSCOPY (EGD);  Surgeon: Cleotis Nipper, MD;  Location: [facility] ENDOSCOPY;  Service: Endoscopy;  Laterality: N/A;  . ESOPHAGOGASTRODUODENOSCOPY (EGD) WITH PROPOFOL N/A 09/16/2016   Procedure: ESOPHAGOGASTRODUODENOSCOPY (EGD) WITH PROPOFOL;  Surgeon: Jerene Bears, MD;  Location: [facility] ENDOSCOPY;   Service: Gastroenterology;  Laterality: N/A;  . EXCHANGE OF A DIALYSIS CATHETER Left 11/13/2016   Procedure: EXCHANGE OF A DIALYSIS CATHETER;  Surgeon: Elam [place], MD;  Location: [place];  Service: Vascular;  Laterality: Left;  . EYE SURGERY Left    to remove scar tissue  . FRACTURE SURGERY    . GIVENS CAPSULE STUDY N/A 11/04/2012   Procedure: GIVENS CAPSULE STUDY;  Surgeon: Cleotis Nipper, MD;  Location: [facility] ENDOSCOPY;  Service: Endoscopy;  Laterality: N/A;  . GIVENS CAPSULE STUDY N/A 09/29/2016   Procedure: GIVENS CAPSULE STUDY;  Surgeon: Manus Gunning, MD;  Location: [place];  Service: Gastroenterology;  Laterality: N/A;  try to keep pt up in bedside chair as much as possible during the study.    [name] HARDWARE REMOVAL Left 06/09/2013   Procedure: HARDWARE REMOVAL;  Surgeon: Newt Minion, MD;  Location: [place];  Service: Orthopedics;  Laterality: Left;  Left Below Knee Amputation  and Removal proximal screws IM tibial nail  . INSERTION OF DIALYSIS CATHETER N/A 11/09/2016   Procedure: INSERTION OF TEMPORARY DIALYSIS CATHETER;  Surgeon: Elam [place], MD;  Location: [place];  Service: Vascular;  Laterality: N/A;  . IR GENERIC HISTORICAL  09/27/2016   IR US GUIDE VASC ACCESS RIGHT 09/27/2016 Aletta Edouard, MD [facility]-INTERV RAD  . IR GENERIC HISTORICAL  09/27/2016   IR FLUORO GUIDE CV LINE RIGHT 09/27/2016 Aletta Edouard, MD [facility]INTER[facility]  . LIGATION OF ARTERIOVENOUS  FISTULA Left 11/09/2016   Procedure: LIGATION OF ARTERIOVENOUS  FISTULA;  Surgeon: Elam [place], MD;  Location: [place];  Service: Vascular;  Laterality: Left;  . NEPHRECTOMY     partial RR  . ORIF FIBULA FRACTURE Left 09/09/2012   Procedure: OPEN REDUCTION INTERNAL FIXATION (ORIF) FIBULA FRACTURE;  Surgeon: Johnny Bridge, MD;  Location: [place];  Service: Orthopedics;  Laterality: Left;  . TIBIA IM NAIL INSERTION Left 09/09/2012   Procedure: INTRAMEDULLARY (IM) NAIL TIBIAL;  Surgeon: Johnny Bridge, MD;  Location: [place];   Service: Orthopedics;  Laterality: Left;  left tibial nail and open reduction internal fixation left fibula fracture    Social History   Social History  . Marital status: Married    Spouse name: N/A  . Number of children: 2  . Years of education: 16   Occupational History  . disabled due to stroke Retired   Social History Main Topics  . Smoking status: Never Smoker  . Smokeless tobacco: Never Used  . Alcohol use No  . Drug use: No  . Sexual activity: Yes    Birth control/ protection: None   Other Topics Concern  . 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 Diabetes Mother   . Hypertension Mother   . Heart attack Father   . Hypertension Father   .  Coronary artery disease Other     Current Outpatient Prescriptions  Medication Sig Dispense Refill  . acetaminophen (TYLENOL) 500 MG tablet Take 500 mg by mouth every 6 (six) hours as needed for mild pain.    [name] amiodarone (PACERONE) 200 MG tablet TAKE 1 TABLET BY MOUTH DAILY, START THIS ON 11/06/16 90 tablet 3  . atorvastatin (LIPITOR) 40 MG tablet TAKE 1 TABLET(40 MG) BY MOUTH DAILY AT 6 PM 30 tablet 3  . calcium acetate (PHOSLO) 667 MG capsule Take 2,001 mg by mouth 3 (three) times daily with meals.    . latanoprost (XALATAN) 0.005 % ophthalmic solution Place 1 drop into both eyes at bedtime.     . lidocaine-prilocaine (EMLA) cream APPLY A SMALL AMOUNT TO SKIN AT THE ACCESS SITE (AVF) AS DIRECTED BEFORE EACH DIALYSIS SESSION THREE DAYS A WEEK  6  . pantoprazole (PROTONIX) 40 MG tablet Take 1 tablet (40 mg total) by mouth 2 (two) times daily before a meal. 60 tablet 11  . QUEtiapine (SEROQUEL) 25 MG tablet Take 1 tablet (25 mg total) by mouth 2 (two) times daily. 60 tablet 5  . SENSIPAR 30 MG tablet Take 30 mg by mouth every evening.     . silver sulfADIAZINE (SILVADENE) 1 % cream Apply 1  application topically daily as needed (irratation).    . warfarin (COUMADIN) 2.5 MG tablet Take 2.5mg  nightly, check INR on 10/05/2016 (Patient taking differently: Take 2.5-3.75 mg by mouth daily. Take 2.5 mg on Friday, Sunday and Tuesday.  all other days take 3.75 mg.) 30 tablet 0   No current facility-administered medications for this visit.      No Known Allergies   REVIEW OF SYSTEMS:  (Positives checked otherwise negative)  CARDIOVASCULAR:   [ ]  chest pain,  [ ]  chest pressure,  [ ]  palpitations,  [ ]  shortness of breath when laying flat,  [ ]  shortness of breath with exertion,   [x]  pain in hips when walking,  [ ]  pain in feet when laying flat, [ ]  history of blood clot in veins (DVT),  [ ]  history of phlebitis,  [ ]  swelling in legs,  [ ]  varicose veins  PULMONARY:   [ ]  productive cough,  [ ]  asthma,  [ ]  wheezing  NEUROLOGIC:   [ ]  weakness in arms or legs,  [ ]  numbness in arms or legs,  [ ]  difficulty speaking or slurred speech,  [ ]  temporary loss of vision in one eye,  [ ]  dizziness  HEMATOLOGIC:   [ ]  bleeding problems,  [ ]  problems with blood clotting too easily  MUSCULOSKEL:   [ ]  joint pain, [ ]  joint swelling  GASTROINTEST:   [ ]  vomiting blood,  [ ]  blood in stool     GENITOURINARY:   [ ]  burning with urination,  [ ]  blood in urine  PSYCHIATRIC:   [ ]  history of major depression  INTEGUMENTARY:   [ ]  rashes,  [ ]  ulcers  CONSTITUTIONAL:   [ ]  fever,  [ ]  chills    Physical Examination   Vitals:   12/02/16 1016 12/02/16 1019  BP: (!) 150/96 (!) 131/91  Pulse: 69 69  Resp: 18   Temp: 98.2 F (36.8 C)   SpO2: 99%   Weight: 210 lb (95.3 kg)   Height: 5\' 10"  (1.778 m)     Body mass index is 30.13 kg/m.  General Alert, O x 3, WD, NAD  Pulmonary Sym exp, good B  air movt, CTA B  Cardiac RRR, Nl S1, S2, no Murmurs, No rubs, No S3,S4  Vascular Vessel Right Left  Radial Faintly palpable Faintly palpable  Ulnar Not palpable  Not palpable  Brachial Palpable Palpable    Gastrointestinal soft, non-distended, non-tender to palpation, No guarding or rebound, no HSM, no masses, no CVAT B, No palpable prominent aortic pulse,    Musculoskeletal M/S 5/5 throughout except BLE due to BKA, Extremities without ischemic changes except B BKA, healed L arm incisions  Neurologic Pain and light touch intact in extremities except for decreased sensation in B BKA, Motor exam as listed above    Non-Invasive Vascular Imaging   BUE Doppler (12/02/2016):   R arm:   Brachial: tri, 5.4 mm  Radial: tri, 1.9 mm  Ulnar: tri, 1.4 mm  L arm:   Brachial: tri, 4.5 mm  R Vein Mapping  (12/02/2016):   R arm: acceptable vein conduits include none   Medical Decision Making   Oscar Castillo is a 66 y.o. male who presents with ESRD requiring hemodialysis, s/p L covered stent excision and L AVF excision.    Pt wants to avoid using his R arm.  There was no L arm vein mapping and there is a history of L cover stent excision.  Regardless in this situation, I would get: B arm and central venogram Risk of extremity and central venography include but are not limited to: anaphylaxis, bleeding, infection, and incomplete visualization of the venous system.  The patient has agreed to proceed with the above procedure which will be scheduled 21 MAY 18.   Adele Barthel, MD, FACS [facility] Office: [phone] Pager: [phone]

## 2016-12-15 DIAGNOSIS — D631 Anemia in chronic kidney disease: Secondary | ICD-10-CM | POA: Diagnosis not present

## 2016-12-15 DIAGNOSIS — E1122 Type 2 diabetes mellitus with diabetic chronic kidney disease: Secondary | ICD-10-CM | POA: Diagnosis not present

## 2016-12-15 DIAGNOSIS — N186 End stage renal disease: Secondary | ICD-10-CM | POA: Diagnosis not present

## 2016-12-15 DIAGNOSIS — N2581 Secondary hyperparathyroidism of renal origin: Secondary | ICD-10-CM | POA: Diagnosis not present

## 2016-12-15 DIAGNOSIS — R7881 Bacteremia: Secondary | ICD-10-CM | POA: Diagnosis not present

## 2016-12-16 ENCOUNTER — Telehealth: Payer: Self-pay | Admitting: Internal Medicine

## 2016-12-16 DIAGNOSIS — I480 Paroxysmal atrial fibrillation: Secondary | ICD-10-CM | POA: Diagnosis not present

## 2016-12-16 DIAGNOSIS — D631 Anemia in chronic kidney disease: Secondary | ICD-10-CM | POA: Diagnosis not present

## 2016-12-16 DIAGNOSIS — I132 Hypertensive heart and chronic kidney disease with heart failure and with stage 5 chronic kidney disease, or end stage renal disease: Secondary | ICD-10-CM | POA: Diagnosis not present

## 2016-12-16 DIAGNOSIS — I5042 Chronic combined systolic (congestive) and diastolic (congestive) heart failure: Secondary | ICD-10-CM | POA: Diagnosis not present

## 2016-12-16 DIAGNOSIS — N186 End stage renal disease: Secondary | ICD-10-CM | POA: Diagnosis not present

## 2016-12-16 DIAGNOSIS — E1122 Type 2 diabetes mellitus with diabetic chronic kidney disease: Secondary | ICD-10-CM | POA: Diagnosis not present

## 2016-12-16 NOTE — Telephone Encounter (Signed)
New Message  Cecile Hearing voiced she is calling because she is the at home nurse and wanting to know if she needs to do the [facility] for Friday so pt doesn't have to come into the office.  Please f/u

## 2016-12-16 NOTE — Telephone Encounter (Signed)
Returned call back to [place] left her msg because she did answer & stated that we do not need an INR on the pt she was calling about today & to call back with any other questions.

## 2016-12-17 ENCOUNTER — Telehealth: Payer: Self-pay | Admitting: Internal Medicine

## 2016-12-17 DIAGNOSIS — D631 Anemia in chronic kidney disease: Secondary | ICD-10-CM | POA: Diagnosis not present

## 2016-12-17 DIAGNOSIS — N186 End stage renal disease: Secondary | ICD-10-CM | POA: Diagnosis not present

## 2016-12-17 DIAGNOSIS — R7881 Bacteremia: Secondary | ICD-10-CM | POA: Diagnosis not present

## 2016-12-17 DIAGNOSIS — E1122 Type 2 diabetes mellitus with diabetic chronic kidney disease: Secondary | ICD-10-CM | POA: Diagnosis not present

## 2016-12-17 DIAGNOSIS — I4891 Unspecified atrial fibrillation: Secondary | ICD-10-CM | POA: Diagnosis not present

## 2016-12-17 DIAGNOSIS — N2581 Secondary hyperparathyroidism of renal origin: Secondary | ICD-10-CM | POA: Diagnosis not present

## 2016-12-17 DIAGNOSIS — Z5181 Encounter for therapeutic drug level monitoring: Secondary | ICD-10-CM | POA: Diagnosis not present

## 2016-12-17 NOTE — Telephone Encounter (Signed)
New message    Would like verbal order to do PTINR at home , instead of having pt come in office

## 2016-12-17 NOTE — Telephone Encounter (Signed)
Spoke with [facility] and gave order to do INR at home tomorrow and call with result from home.

## 2016-12-18 ENCOUNTER — Ambulatory Visit (INDEPENDENT_AMBULATORY_CARE_PROVIDER_SITE_OTHER): Payer: Self-pay | Admitting: Internal Medicine

## 2016-12-18 DIAGNOSIS — I132 Hypertensive heart and chronic kidney disease with heart failure and with stage 5 chronic kidney disease, or end stage renal disease: Secondary | ICD-10-CM | POA: Diagnosis not present

## 2016-12-18 DIAGNOSIS — E1122 Type 2 diabetes mellitus with diabetic chronic kidney disease: Secondary | ICD-10-CM | POA: Diagnosis not present

## 2016-12-18 DIAGNOSIS — G459 Transient cerebral ischemic attack, unspecified: Secondary | ICD-10-CM

## 2016-12-18 DIAGNOSIS — Z5181 Encounter for therapeutic drug level monitoring: Secondary | ICD-10-CM

## 2016-12-18 DIAGNOSIS — I5042 Chronic combined systolic (congestive) and diastolic (congestive) heart failure: Secondary | ICD-10-CM | POA: Diagnosis not present

## 2016-12-18 DIAGNOSIS — N186 End stage renal disease: Secondary | ICD-10-CM | POA: Diagnosis not present

## 2016-12-18 DIAGNOSIS — I480 Paroxysmal atrial fibrillation: Secondary | ICD-10-CM | POA: Diagnosis not present

## 2016-12-18 DIAGNOSIS — D631 Anemia in chronic kidney disease: Secondary | ICD-10-CM | POA: Diagnosis not present

## 2016-12-18 LAB — POCT INR: INR: 3

## 2016-12-19 DIAGNOSIS — D631 Anemia in chronic kidney disease: Secondary | ICD-10-CM | POA: Diagnosis not present

## 2016-12-19 DIAGNOSIS — N2581 Secondary hyperparathyroidism of renal origin: Secondary | ICD-10-CM | POA: Diagnosis not present

## 2016-12-19 DIAGNOSIS — N186 End stage renal disease: Secondary | ICD-10-CM | POA: Diagnosis not present

## 2016-12-19 DIAGNOSIS — E1122 Type 2 diabetes mellitus with diabetic chronic kidney disease: Secondary | ICD-10-CM | POA: Diagnosis not present

## 2016-12-19 DIAGNOSIS — R7881 Bacteremia: Secondary | ICD-10-CM | POA: Diagnosis not present

## 2016-12-22 DIAGNOSIS — H25011 Cortical age-related cataract, right eye: Secondary | ICD-10-CM | POA: Diagnosis not present

## 2016-12-22 DIAGNOSIS — D631 Anemia in chronic kidney disease: Secondary | ICD-10-CM | POA: Diagnosis not present

## 2016-12-22 DIAGNOSIS — Z961 Presence of intraocular lens: Secondary | ICD-10-CM | POA: Diagnosis not present

## 2016-12-22 DIAGNOSIS — H25041 Posterior subcapsular polar age-related cataract, right eye: Secondary | ICD-10-CM | POA: Diagnosis not present

## 2016-12-22 DIAGNOSIS — E1122 Type 2 diabetes mellitus with diabetic chronic kidney disease: Secondary | ICD-10-CM | POA: Diagnosis not present

## 2016-12-22 DIAGNOSIS — H2511 Age-related nuclear cataract, right eye: Secondary | ICD-10-CM | POA: Diagnosis not present

## 2016-12-22 DIAGNOSIS — H401112 Primary open-angle glaucoma, right eye, moderate stage: Secondary | ICD-10-CM | POA: Diagnosis not present

## 2016-12-22 DIAGNOSIS — N186 End stage renal disease: Secondary | ICD-10-CM | POA: Diagnosis not present

## 2016-12-22 DIAGNOSIS — N2581 Secondary hyperparathyroidism of renal origin: Secondary | ICD-10-CM | POA: Diagnosis not present

## 2016-12-22 DIAGNOSIS — R7881 Bacteremia: Secondary | ICD-10-CM | POA: Diagnosis not present

## 2016-12-23 DIAGNOSIS — I132 Hypertensive heart and chronic kidney disease with heart failure and with stage 5 chronic kidney disease, or end stage renal disease: Secondary | ICD-10-CM | POA: Diagnosis not present

## 2016-12-23 DIAGNOSIS — E1122 Type 2 diabetes mellitus with diabetic chronic kidney disease: Secondary | ICD-10-CM | POA: Diagnosis not present

## 2016-12-23 DIAGNOSIS — D631 Anemia in chronic kidney disease: Secondary | ICD-10-CM | POA: Diagnosis not present

## 2016-12-23 DIAGNOSIS — I5042 Chronic combined systolic (congestive) and diastolic (congestive) heart failure: Secondary | ICD-10-CM | POA: Diagnosis not present

## 2016-12-23 DIAGNOSIS — I480 Paroxysmal atrial fibrillation: Secondary | ICD-10-CM | POA: Diagnosis not present

## 2016-12-23 DIAGNOSIS — N186 End stage renal disease: Secondary | ICD-10-CM | POA: Diagnosis not present

## 2016-12-24 DIAGNOSIS — E1122 Type 2 diabetes mellitus with diabetic chronic kidney disease: Secondary | ICD-10-CM | POA: Diagnosis not present

## 2016-12-24 DIAGNOSIS — N2581 Secondary hyperparathyroidism of renal origin: Secondary | ICD-10-CM | POA: Diagnosis not present

## 2016-12-24 DIAGNOSIS — N186 End stage renal disease: Secondary | ICD-10-CM | POA: Diagnosis not present

## 2016-12-24 DIAGNOSIS — R7881 Bacteremia: Secondary | ICD-10-CM | POA: Diagnosis not present

## 2016-12-24 DIAGNOSIS — D631 Anemia in chronic kidney disease: Secondary | ICD-10-CM | POA: Diagnosis not present

## 2016-12-24 DIAGNOSIS — Z992 Dependence on renal dialysis: Secondary | ICD-10-CM | POA: Diagnosis not present

## 2016-12-25 ENCOUNTER — Telehealth: Payer: Self-pay | Admitting: *Deleted

## 2016-12-25 DIAGNOSIS — D631 Anemia in chronic kidney disease: Secondary | ICD-10-CM | POA: Diagnosis not present

## 2016-12-25 DIAGNOSIS — I132 Hypertensive heart and chronic kidney disease with heart failure and with stage 5 chronic kidney disease, or end stage renal disease: Secondary | ICD-10-CM | POA: Diagnosis not present

## 2016-12-25 DIAGNOSIS — I5042 Chronic combined systolic (congestive) and diastolic (congestive) heart failure: Secondary | ICD-10-CM | POA: Diagnosis not present

## 2016-12-25 DIAGNOSIS — E1122 Type 2 diabetes mellitus with diabetic chronic kidney disease: Secondary | ICD-10-CM | POA: Diagnosis not present

## 2016-12-25 DIAGNOSIS — I480 Paroxysmal atrial fibrillation: Secondary | ICD-10-CM | POA: Diagnosis not present

## 2016-12-25 DIAGNOSIS — N186 End stage renal disease: Secondary | ICD-10-CM | POA: Diagnosis not present

## 2016-12-25 NOTE — Telephone Encounter (Signed)
Spoke with [facility] Physical Therapist who states she has done 5 finger sticks and can not get enough blood to do INR and she is not able to do venipuncture. Spoke with [place] PharmD who states can do INR on Monday since he was in range on last check. Spoke with pt's wife and she states she wants to start bringing him back to the clinic so appt made for him to be seen in the clinic on Monday June 4th and she states understanding

## 2016-12-26 DIAGNOSIS — N186 End stage renal disease: Secondary | ICD-10-CM | POA: Diagnosis not present

## 2016-12-26 DIAGNOSIS — E1122 Type 2 diabetes mellitus with diabetic chronic kidney disease: Secondary | ICD-10-CM | POA: Diagnosis not present

## 2016-12-26 DIAGNOSIS — N2581 Secondary hyperparathyroidism of renal origin: Secondary | ICD-10-CM | POA: Diagnosis not present

## 2016-12-28 ENCOUNTER — Ambulatory Visit (INDEPENDENT_AMBULATORY_CARE_PROVIDER_SITE_OTHER): Payer: Medicare Other | Admitting: *Deleted

## 2016-12-28 DIAGNOSIS — Z5181 Encounter for therapeutic drug level monitoring: Secondary | ICD-10-CM | POA: Diagnosis not present

## 2016-12-28 DIAGNOSIS — N186 End stage renal disease: Secondary | ICD-10-CM | POA: Diagnosis not present

## 2016-12-28 DIAGNOSIS — I5042 Chronic combined systolic (congestive) and diastolic (congestive) heart failure: Secondary | ICD-10-CM | POA: Diagnosis not present

## 2016-12-28 DIAGNOSIS — D631 Anemia in chronic kidney disease: Secondary | ICD-10-CM | POA: Diagnosis not present

## 2016-12-28 DIAGNOSIS — I638 Other cerebral infarction: Secondary | ICD-10-CM

## 2016-12-28 DIAGNOSIS — I132 Hypertensive heart and chronic kidney disease with heart failure and with stage 5 chronic kidney disease, or end stage renal disease: Secondary | ICD-10-CM | POA: Diagnosis not present

## 2016-12-28 DIAGNOSIS — I480 Paroxysmal atrial fibrillation: Secondary | ICD-10-CM | POA: Diagnosis not present

## 2016-12-28 DIAGNOSIS — E1122 Type 2 diabetes mellitus with diabetic chronic kidney disease: Secondary | ICD-10-CM | POA: Diagnosis not present

## 2016-12-28 DIAGNOSIS — G459 Transient cerebral ischemic attack, unspecified: Secondary | ICD-10-CM

## 2016-12-28 DIAGNOSIS — I4891 Unspecified atrial fibrillation: Secondary | ICD-10-CM

## 2016-12-28 LAB — POCT INR: INR: 4.9

## 2016-12-28 MED ORDER — WARFARIN SODIUM 2.5 MG PO TABS: ORAL_TABLET | ORAL | 3 refills | Status: DC

## 2016-12-28 NOTE — Addendum Note (Signed)
Addendum  created 12/28/16 1401 by Oleta Mouse, MD   Sign clinical note

## 2016-12-29 DIAGNOSIS — E1122 Type 2 diabetes mellitus with diabetic chronic kidney disease: Secondary | ICD-10-CM | POA: Diagnosis not present

## 2016-12-29 DIAGNOSIS — Z89511 Acquired absence of right leg below knee: Secondary | ICD-10-CM | POA: Diagnosis not present

## 2016-12-29 DIAGNOSIS — Z8673 Personal history of transient ischemic attack (TIA), and cerebral infarction without residual deficits: Secondary | ICD-10-CM | POA: Diagnosis not present

## 2016-12-29 DIAGNOSIS — Z89512 Acquired absence of left leg below knee: Secondary | ICD-10-CM | POA: Diagnosis not present

## 2016-12-29 DIAGNOSIS — K219 Gastro-esophageal reflux disease without esophagitis: Secondary | ICD-10-CM | POA: Diagnosis not present

## 2016-12-29 DIAGNOSIS — N186 End stage renal disease: Secondary | ICD-10-CM | POA: Diagnosis not present

## 2016-12-29 DIAGNOSIS — F329 Major depressive disorder, single episode, unspecified: Secondary | ICD-10-CM | POA: Diagnosis not present

## 2016-12-29 DIAGNOSIS — D631 Anemia in chronic kidney disease: Secondary | ICD-10-CM | POA: Diagnosis not present

## 2016-12-29 DIAGNOSIS — K922 Gastrointestinal hemorrhage, unspecified: Secondary | ICD-10-CM | POA: Diagnosis not present

## 2016-12-29 DIAGNOSIS — I480 Paroxysmal atrial fibrillation: Secondary | ICD-10-CM | POA: Diagnosis not present

## 2016-12-29 DIAGNOSIS — N2581 Secondary hyperparathyroidism of renal origin: Secondary | ICD-10-CM | POA: Diagnosis not present

## 2016-12-29 DIAGNOSIS — I5042 Chronic combined systolic (congestive) and diastolic (congestive) heart failure: Secondary | ICD-10-CM | POA: Diagnosis not present

## 2016-12-29 DIAGNOSIS — I132 Hypertensive heart and chronic kidney disease with heart failure and with stage 5 chronic kidney disease, or end stage renal disease: Secondary | ICD-10-CM | POA: Diagnosis not present

## 2016-12-30 DIAGNOSIS — I5042 Chronic combined systolic (congestive) and diastolic (congestive) heart failure: Secondary | ICD-10-CM | POA: Diagnosis not present

## 2016-12-30 DIAGNOSIS — I132 Hypertensive heart and chronic kidney disease with heart failure and with stage 5 chronic kidney disease, or end stage renal disease: Secondary | ICD-10-CM | POA: Diagnosis not present

## 2016-12-30 DIAGNOSIS — E1122 Type 2 diabetes mellitus with diabetic chronic kidney disease: Secondary | ICD-10-CM | POA: Diagnosis not present

## 2016-12-30 DIAGNOSIS — I480 Paroxysmal atrial fibrillation: Secondary | ICD-10-CM | POA: Diagnosis not present

## 2016-12-30 DIAGNOSIS — D631 Anemia in chronic kidney disease: Secondary | ICD-10-CM | POA: Diagnosis not present

## 2016-12-30 DIAGNOSIS — N186 End stage renal disease: Secondary | ICD-10-CM | POA: Diagnosis not present

## 2016-12-31 ENCOUNTER — Telehealth: Payer: Self-pay | Admitting: Pharmacist

## 2016-12-31 ENCOUNTER — Other Ambulatory Visit: Payer: Self-pay

## 2016-12-31 DIAGNOSIS — N2581 Secondary hyperparathyroidism of renal origin: Secondary | ICD-10-CM | POA: Diagnosis not present

## 2016-12-31 DIAGNOSIS — E1122 Type 2 diabetes mellitus with diabetic chronic kidney disease: Secondary | ICD-10-CM | POA: Diagnosis not present

## 2016-12-31 DIAGNOSIS — N186 End stage renal disease: Secondary | ICD-10-CM | POA: Diagnosis not present

## 2016-12-31 NOTE — Telephone Encounter (Addendum)
Received call from [facility] with Dr Lianne Moris office [phone]) that pt will require fistula placement with possible graft. Pt will need to hold Coumadin for 5 days prior. Pt takes Coumadin for afib with CHADS2 score of 5 (CHF, HTM, DM, TIA). Pt will require Lovenox bridging.   Pt contacted and scheduled tomorrow 6/8 to coordinate Lovenox bridging.

## 2017-01-01 ENCOUNTER — Ambulatory Visit (INDEPENDENT_AMBULATORY_CARE_PROVIDER_SITE_OTHER): Payer: Medicare Other | Admitting: Pharmacist

## 2017-01-01 DIAGNOSIS — I638 Other cerebral infarction: Secondary | ICD-10-CM

## 2017-01-01 DIAGNOSIS — G459 Transient cerebral ischemic attack, unspecified: Secondary | ICD-10-CM

## 2017-01-01 DIAGNOSIS — I4891 Unspecified atrial fibrillation: Secondary | ICD-10-CM

## 2017-01-01 DIAGNOSIS — Z5181 Encounter for therapeutic drug level monitoring: Secondary | ICD-10-CM | POA: Diagnosis not present

## 2017-01-01 LAB — POCT INR: INR: 2.3

## 2017-01-01 MED ORDER — ENOXAPARIN SODIUM 100 MG/ML ~~LOC~~ SOLN: 100.0000 mg | SUBCUTANEOUS | 1 refills | Status: DC

## 2017-01-01 NOTE — Patient Instructions (Addendum)
01/01/17 - No Coumadin or Lovenox 01/02/17 - Inject Lovenox 100 mg into fatty abdominal tissue at least 2 inches from belly button once a day at 4pm. Rotate injection sites. No Coumadin. 01/03/17 - Inject Lovenox 100 mg at 4pm. No Coumadin.  01/04/17 - Inject Lovenox 100 mg at 4pm. No Coumadin.  01/05/17 - No Lovenox. No Coumadin.  01/06/17 - Procedure day - No Lovenox. Restart Coumadin in the evening (2 tablets).  01/07/17 - Restart Lovenox 100 mg injection into the fatty abdominal tissue every day at 8am. Take Coumadin 1.5 tablets in the evening. 01/08/17 - Inject Lovenox 100 mg into the fatty abdominal tissue every day at 8am. Take Coumadin 1.5 tablets in the evening. 01/09/17 - Inject Lovenox 100 mg into the fatty abdominal tissue every day at 8am. Take Coumadin 1 tablet in the evening. 01/10/17 - Inject Lovenox 100 mg into the fatty abdominal tissue every day at 8am. Take Coumadin 1 tablet in the evening. 01/11/17 - Inject Lovenox 100 mg into the fatty abdominal tissue every day at 8am. Take Coumadin 1 tablet in the evening. 01/12/17 - INR check.

## 2017-01-02 DIAGNOSIS — E1122 Type 2 diabetes mellitus with diabetic chronic kidney disease: Secondary | ICD-10-CM | POA: Diagnosis not present

## 2017-01-02 DIAGNOSIS — N2581 Secondary hyperparathyroidism of renal origin: Secondary | ICD-10-CM | POA: Diagnosis not present

## 2017-01-02 DIAGNOSIS — N186 End stage renal disease: Secondary | ICD-10-CM | POA: Diagnosis not present

## 2017-01-04 ENCOUNTER — Other Ambulatory Visit: Payer: Self-pay | Admitting: Internal Medicine

## 2017-01-04 ENCOUNTER — Encounter (HOSPITAL_COMMUNITY): Payer: Self-pay | Admitting: *Deleted

## 2017-01-04 DIAGNOSIS — I132 Hypertensive heart and chronic kidney disease with heart failure and with stage 5 chronic kidney disease, or end stage renal disease: Secondary | ICD-10-CM | POA: Diagnosis not present

## 2017-01-04 DIAGNOSIS — D631 Anemia in chronic kidney disease: Secondary | ICD-10-CM | POA: Diagnosis not present

## 2017-01-04 DIAGNOSIS — I5042 Chronic combined systolic (congestive) and diastolic (congestive) heart failure: Secondary | ICD-10-CM | POA: Diagnosis not present

## 2017-01-04 DIAGNOSIS — I480 Paroxysmal atrial fibrillation: Secondary | ICD-10-CM | POA: Diagnosis not present

## 2017-01-04 DIAGNOSIS — N186 End stage renal disease: Secondary | ICD-10-CM | POA: Diagnosis not present

## 2017-01-04 DIAGNOSIS — E1122 Type 2 diabetes mellitus with diabetic chronic kidney disease: Secondary | ICD-10-CM | POA: Diagnosis not present

## 2017-01-04 NOTE — Progress Notes (Signed)
Patient denies chest pain/ shortness of breath. Cardiologist Dr. Harrington Challenger

## 2017-01-05 ENCOUNTER — Telehealth: Payer: Self-pay

## 2017-01-05 DIAGNOSIS — N2581 Secondary hyperparathyroidism of renal origin: Secondary | ICD-10-CM | POA: Diagnosis not present

## 2017-01-05 DIAGNOSIS — N186 End stage renal disease: Secondary | ICD-10-CM | POA: Diagnosis not present

## 2017-01-05 DIAGNOSIS — E1122 Type 2 diabetes mellitus with diabetic chronic kidney disease: Secondary | ICD-10-CM | POA: Diagnosis not present

## 2017-01-05 NOTE — Progress Notes (Signed)
Anesthesia chart review: [name].  Patient is a 66 year old male scheduled for first stage left arm basilic vein transposition, possible LUE Hybrid AVGG on 01/06/17 by Dr. Bridgett Larsson.  History includes non-smoker, ESRD (ligation LUE AVF 11/09/16; dialysis catheter exchange 11/13/16, HTN, hepatitis C, PAF (12/2015; afib with RVR 10/07/16; on warfarin and amiodarone), chronic combined systolic and diastolic CHF, left bundle branch block, CVA '09, Bipolar disorder, GI bleed 09/2016, left BKA 06/09/13, right BKA 09/08/13 with revision 10/11/13, ED, GERD, depression, BPH, C. difficile, anemia, ORIF left fibula fracture/IM left tibia 09/09/12.   Hospitalization 11/07/16-11/14/16 for Staph aureus sepsis due to infected AVF s/p removal and dialysis catheter. ID consulted. Echo done (see below). Repeat blood cultures sent on 4/18 negative. Blood cultures from 4/17 positive for S hominis, likely contaminant. Patient will be discharged on Cefazolin 2 gm IV on T-Th- Sat dialysis till 11/26/16  PCP is Dr. [name].  Cardiologist is Dr. Dorris Carnes. Last visit 11/02/16 with Leanor Kail, PA-C. [facility] Anticoagulation clinic was contacted by [facility] and Lovenox bridge instructions provided.   Meds include amiodarone, Lipitor, PhosLo, warfarin (on hold), Lovenox bridge, Xalatan ophthalmic, Protonix, Seroquel, Sensipar.   EKG 11/08/16: SR, first degree AV block, left BBB (old).  Echo 11/09/16: Study Conclusions - Left ventricle: The cavity size was normal. Systolic function was   mildly reduced. The estimated ejection fraction was in the range   of 45% to 50%. There is severe hypokinesis of the basal-mid   anteroseptal myocardium. - Aortic valve: There was very mild stenosis. - Mitral valve: Calcified annulus. Moderately thickened, severely   calcified leaflets posterior. Mobility of the posterior leaflet   was moderately restricted. Transvalvular velocity was minimally   increased. The findings are consistent with  trivial stenosis.   There was moderate regurgitation. - Left atrium: The atrium was mildly dilated. - Right atrium: The atrium was mildly dilated. - Pulmonary arteries: Systolic pressure was mildly increased. PA   peak pressure: 34 mm Hg (S). (Comparison echo 12/27/15 showed LVEF 50-55%, normal wall motion, no regional wall motion abnormalities, mild MR.)   Nuclear stress test 12/30/15:  Downsloping ST segment depression ST segment depression was noted during stress in the II, V5 and V6 leads.  This is an intermediate risk study.  The left ventricular ejection fraction is moderately decreased (30-44%). 1. There was a fixed, medium-sized, mild mid to apical anteroseptal and inferoseptal perfusion defect.  2. EF 36% with septal wall motion abnormality. 3. Intermediate risk study.  Findings can all be explained by LBBB itself (perfusion defect due to artifact from LBBB), however cannot rule out prior infarction.  No significant ischemia.  Intermediate risk due to low EF. Would get echo to confirm EF.   PCXR 11/13/16: IMPRESSION: Template dialysis catheter projects deep in the right atrium. Negative for pneumothorax. Cardiomegaly without edema.  He will need labs prior to surgery.    Discussed above with anesthesiologist Dr. Ermalene Postin. Patient with a decrease in EF and new anteroseptal wall motion abnormality on 10/2016 echo. This was in the setting of sepsis. I don't see that cardiology is aware on echo results, although he did undergo two vascular procedures during that admission. He denied CP and SOB to PAT phone interviewing RN. With new echo changes since his last cardiology visit, Dr. Ermalene Postin recommended cardiology input (preferrably with follow-up echo). I attempted to contact Dr. Harrington Challenger, but she is out of the office (will be in the hospital on 01/07/17). I notified [facility] RN Arbie Cookey that anesthesia  requests cardiology pre-operative input. She will discuss with Dr. Bridgett Larsson to see if plan would be to postpone  surgery or attempt to have a partner of Dr. Harrington Challenger' see patient or weigh in prior to OR.   [name] [facility] Short Stay Center/Anesthesiology Phone [phone] 01/05/2017 4:55 PM

## 2017-01-05 NOTE — Telephone Encounter (Signed)
rec'd call from Anesthesia PA.  Advised that pt. Had an abnormal Echo 11/09/16:  "estimated ejection fraction was in the range of 45% to 50%.  Regional wall motion abnormalities:  There is severe hypokinesis of the basal-mid anteroseptal myocardium." Stated she was not able to contact Dr. Dorris Carnes at this time re: the above.  Advised that the Anesthesiologist recommends cancelling the procedure for Left 1st stage BRVT, until Cardiac Clearance can be obtained.  Notified Dr. Bridgett Larsson of the above.  Agreed that the case needed to be cancelled at this time, until CC is obtained.  Notified the pt.  Advised of abnormal Echo, and of need to cancel the case until he is evaluated by Cardiology.  Pt. Verb. Understanding.  Requested to call in AM to discuss with his wife, as she is not available at this time.

## 2017-01-06 ENCOUNTER — Ambulatory Visit: Payer: Self-pay | Admitting: Internal Medicine

## 2017-01-06 ENCOUNTER — Telehealth: Payer: Self-pay

## 2017-01-06 NOTE — Telephone Encounter (Signed)
Mrs. Oscar Castillo called today on behalf of her husband, Oscar Castillo, who was scheduled for a fistula placement on 01/06/2017. However, they received a phone call last night that the surgeon would like additional cardiac testing prior to procedure. She called for warfarin management advice.  Patient has followed the below bridging schedule: 01/01/17 - No Coumadin or Lovenox 01/02/17 - Inject Lovenox 100 mg into fatty abdominal tissue at least 2 inches from belly button once a day at 4pm. Rotate injection sites. No Coumadin. 01/03/17 - Inject Lovenox 100 mg at 4pm. No Coumadin.  01/04/17 - Inject Lovenox 100 mg at 4pm. No Coumadin.  01/05/17 - No Lovenox. No Coumadin.   Instructed Mrs. Thalman to give 2 tablets of warfarin today, 1.5 tablets of warfarin tomorrow, then restart normal dose of 1 tablet every day, except 1.5 tablets on Wed and Fri. Instructed to continue Lovenox 100 mg twice daily until next INR check on 01/12/17. She agreed to this plan and stated that they had enough Lovenox to continue injections.

## 2017-01-07 DIAGNOSIS — N186 End stage renal disease: Secondary | ICD-10-CM | POA: Diagnosis not present

## 2017-01-07 DIAGNOSIS — N2581 Secondary hyperparathyroidism of renal origin: Secondary | ICD-10-CM | POA: Diagnosis not present

## 2017-01-07 DIAGNOSIS — E1122 Type 2 diabetes mellitus with diabetic chronic kidney disease: Secondary | ICD-10-CM | POA: Diagnosis not present

## 2017-01-08 DIAGNOSIS — N186 End stage renal disease: Secondary | ICD-10-CM | POA: Diagnosis not present

## 2017-01-08 DIAGNOSIS — D631 Anemia in chronic kidney disease: Secondary | ICD-10-CM | POA: Diagnosis not present

## 2017-01-08 DIAGNOSIS — E1122 Type 2 diabetes mellitus with diabetic chronic kidney disease: Secondary | ICD-10-CM | POA: Diagnosis not present

## 2017-01-08 DIAGNOSIS — I480 Paroxysmal atrial fibrillation: Secondary | ICD-10-CM | POA: Diagnosis not present

## 2017-01-08 DIAGNOSIS — I132 Hypertensive heart and chronic kidney disease with heart failure and with stage 5 chronic kidney disease, or end stage renal disease: Secondary | ICD-10-CM | POA: Diagnosis not present

## 2017-01-08 DIAGNOSIS — I5042 Chronic combined systolic (congestive) and diastolic (congestive) heart failure: Secondary | ICD-10-CM | POA: Diagnosis not present

## 2017-01-09 DIAGNOSIS — N186 End stage renal disease: Secondary | ICD-10-CM | POA: Diagnosis not present

## 2017-01-09 DIAGNOSIS — E1122 Type 2 diabetes mellitus with diabetic chronic kidney disease: Secondary | ICD-10-CM | POA: Diagnosis not present

## 2017-01-09 DIAGNOSIS — N2581 Secondary hyperparathyroidism of renal origin: Secondary | ICD-10-CM | POA: Diagnosis not present

## 2017-01-12 ENCOUNTER — Ambulatory Visit (INDEPENDENT_AMBULATORY_CARE_PROVIDER_SITE_OTHER): Payer: Medicare Other | Admitting: *Deleted

## 2017-01-12 DIAGNOSIS — Z5181 Encounter for therapeutic drug level monitoring: Secondary | ICD-10-CM

## 2017-01-12 DIAGNOSIS — N186 End stage renal disease: Secondary | ICD-10-CM | POA: Diagnosis not present

## 2017-01-12 DIAGNOSIS — I638 Other cerebral infarction: Secondary | ICD-10-CM

## 2017-01-12 DIAGNOSIS — I4891 Unspecified atrial fibrillation: Secondary | ICD-10-CM | POA: Diagnosis not present

## 2017-01-12 DIAGNOSIS — G459 Transient cerebral ischemic attack, unspecified: Secondary | ICD-10-CM

## 2017-01-12 DIAGNOSIS — N2581 Secondary hyperparathyroidism of renal origin: Secondary | ICD-10-CM | POA: Diagnosis not present

## 2017-01-12 DIAGNOSIS — E1122 Type 2 diabetes mellitus with diabetic chronic kidney disease: Secondary | ICD-10-CM | POA: Diagnosis not present

## 2017-01-12 LAB — POCT INR: INR: 2.4

## 2017-01-14 DIAGNOSIS — N2581 Secondary hyperparathyroidism of renal origin: Secondary | ICD-10-CM | POA: Diagnosis not present

## 2017-01-14 DIAGNOSIS — N186 End stage renal disease: Secondary | ICD-10-CM | POA: Diagnosis not present

## 2017-01-14 DIAGNOSIS — E1122 Type 2 diabetes mellitus with diabetic chronic kidney disease: Secondary | ICD-10-CM | POA: Diagnosis not present

## 2017-01-16 DIAGNOSIS — N186 End stage renal disease: Secondary | ICD-10-CM | POA: Diagnosis not present

## 2017-01-16 DIAGNOSIS — N2581 Secondary hyperparathyroidism of renal origin: Secondary | ICD-10-CM | POA: Diagnosis not present

## 2017-01-16 DIAGNOSIS — E1122 Type 2 diabetes mellitus with diabetic chronic kidney disease: Secondary | ICD-10-CM | POA: Diagnosis not present

## 2017-01-19 DIAGNOSIS — N186 End stage renal disease: Secondary | ICD-10-CM | POA: Diagnosis not present

## 2017-01-19 DIAGNOSIS — E1122 Type 2 diabetes mellitus with diabetic chronic kidney disease: Secondary | ICD-10-CM | POA: Diagnosis not present

## 2017-01-19 DIAGNOSIS — N2581 Secondary hyperparathyroidism of renal origin: Secondary | ICD-10-CM | POA: Diagnosis not present

## 2017-01-21 DIAGNOSIS — I4891 Unspecified atrial fibrillation: Secondary | ICD-10-CM | POA: Diagnosis not present

## 2017-01-21 DIAGNOSIS — Z5181 Encounter for therapeutic drug level monitoring: Secondary | ICD-10-CM | POA: Diagnosis not present

## 2017-01-21 DIAGNOSIS — E1122 Type 2 diabetes mellitus with diabetic chronic kidney disease: Secondary | ICD-10-CM | POA: Diagnosis not present

## 2017-01-21 DIAGNOSIS — N2581 Secondary hyperparathyroidism of renal origin: Secondary | ICD-10-CM | POA: Diagnosis not present

## 2017-01-21 DIAGNOSIS — N186 End stage renal disease: Secondary | ICD-10-CM | POA: Diagnosis not present

## 2017-01-21 LAB — PROTIME-INR: INR: 3.5 — AB (ref 0.9–1.1)

## 2017-01-22 ENCOUNTER — Ambulatory Visit (INDEPENDENT_AMBULATORY_CARE_PROVIDER_SITE_OTHER): Payer: Medicare Other | Admitting: Internal Medicine

## 2017-01-22 VITALS — BP 138/78 | HR 79 | Ht 70.0 in | Wt 210.0 lb

## 2017-01-22 DIAGNOSIS — I10 Essential (primary) hypertension: Secondary | ICD-10-CM

## 2017-01-22 DIAGNOSIS — G459 Transient cerebral ischemic attack, unspecified: Secondary | ICD-10-CM

## 2017-01-22 DIAGNOSIS — I447 Left bundle-branch block, unspecified: Secondary | ICD-10-CM | POA: Diagnosis not present

## 2017-01-22 DIAGNOSIS — Z8673 Personal history of transient ischemic attack (TIA), and cerebral infarction without residual deficits: Secondary | ICD-10-CM

## 2017-01-22 DIAGNOSIS — I5032 Chronic diastolic (congestive) heart failure: Secondary | ICD-10-CM | POA: Diagnosis not present

## 2017-01-22 DIAGNOSIS — N186 End stage renal disease: Secondary | ICD-10-CM

## 2017-01-22 DIAGNOSIS — Z5181 Encounter for therapeutic drug level monitoring: Secondary | ICD-10-CM | POA: Diagnosis not present

## 2017-01-22 DIAGNOSIS — I480 Paroxysmal atrial fibrillation: Secondary | ICD-10-CM | POA: Diagnosis not present

## 2017-01-22 DIAGNOSIS — I638 Other cerebral infarction: Secondary | ICD-10-CM | POA: Diagnosis not present

## 2017-01-22 NOTE — Progress Notes (Signed)
Cardiology Office Note   Date:  01/22/2017   ID:  Oscar Castillo, DOB [birth year], MRN [id number]  PCP:  [name], MD  Cardiologist:   Dorris Carnes, MD   Follow up of chronic systolic / diastolic CHF , HTN and PAF   History of Present Illness: Oscar Castillo is a 66 y.o. male with a history of ESRD on hemodialysis, chronic combined systolic and diastolic CHF, CVA, diabetes, HTN, hepatitis C, paroxysmal AF (on coumadin for CHADSVASC score of 7),  PVOD(status post bilateral BKA's), and LBBB.  LV function was normal on echocardiogram in 2017. He had artifact on his Myoview that was explained by LBBB but no ischemia (2017).   Patient was evaluated in 2/18 for heme positive stools. EGD demonstrated esophagitis, gastritis/duodenitis. He was then admitted 3/3-3/9 with blood loss anemia in the setting of melena and suspected hematemesis. He required transfusion with PRBCs. Capsule endoscopy was reportedly unremarkable. Coumadin was resumed at discharge without Lovenox bridging.  He  was noted in AFib with RVR with hypotension when seen in clinic by Richardson Dopp, PA 10/07/16. INR of 1.4. Evaluated by DOD Dr. Marlou Porch and started on IV heparin and IV amiodarone and admitted to hospital. Converted to sinus rhythm. Discharged on amiodarone 400 mg PO BID for 2 weeks, then 400 mg daily for 2 weeks, then 200 mg daily. Resumed coumadin. Due to soft BP-->  BB and ACE held.   He was admitted with sepsis in April 2018 due to infected AV fistula  Temporary catheter placed    Since d/c he says his breathing has been good  Denies CP  He denies palpitations   Being evaluated for shunt placement    Current Meds  Medication Sig  . acetaminophen (TYLENOL) 500 MG tablet Take 500 mg by mouth every 6 (six) hours as needed for mild pain.  [name] amiodarone (PACERONE) 200 MG tablet TAKE 1 TABLET BY MOUTH DAILY, START THIS ON 11/06/16  . atorvastatin (LIPITOR) 40 MG tablet TAKE 1 TABLET(40 MG) BY MOUTH DAILY AT 6 PM  .  calcium acetate (PHOSLO) 667 MG capsule Take 1,334 mg by mouth 3 (three) times daily with meals.   . latanoprost (XALATAN) 0.005 % ophthalmic solution Place 1 drop into both eyes at bedtime.   . pantoprazole (PROTONIX) 40 MG tablet Take 1 tablet (40 mg total) by mouth 2 (two) times daily before a meal.  . QUEtiapine (SEROQUEL) 25 MG tablet TAKE 1 TABLET(25 MG) BY MOUTH TWICE DAILY  . SENSIPAR 30 MG tablet Take 30 mg by mouth every evening.   . silver sulfADIAZINE (SILVADENE) 1 % cream Apply 1 application topically daily as needed (irratation). (Patient taking differently: Apply 1 application topically daily as needed (irritation or itching). )  . warfarin (COUMADIN) 2.5 MG tablet Take 1 tablet daily except 1.5 tablets on Wed and Fri or as directed by Anticoagulation Clinic     Allergies:   No known allergies   Past Medical History:  Diagnosis Date  . Anemia   . Antral ulcer 2014   small  . Atrial fibrillation ([place])   . Atrial fibrillation with RVR ([place]) 10/07/2016  . BACK PAIN, LUMBAR, CHRONIC   . BENIGN PROSTATIC HYPERTROPHY   . Bipolar disorder ([place])    "sometimes" (10/07/2016)  . CHOLELITHIASIS   . Chronic combined systolic and diastolic CHF (congestive heart failure) ([place])   . Complication of anesthesia    wife states pt had trouble waking up with in Nov., 2014  .  CVA (cerebral vascular accident) ([place]) 07/2007  . DEPRESSION   . DIABETES MELLITUS, TYPE II   . ERECTILE DYSFUNCTION   . ESRD (end stage renal disease) on dialysis [facility])    "[place]; TTS" (10/07/2016)  . ESRD on hemodialysis ([place])    ESRD due to DM/HTN. Started dialysis in November 2013.  HD TTS at [facility] on [address].  [name] GERD   . GI bleed    due to gastritis, discharged 10/02/16/notes 10/07/2016  . Headache    "monthly" (10/07/2016)  . Hemorrhoids   . Hepatitis C    C  . History of blood transfusion    "related to dialysis" (10/07/2016)  . History of Clostridium difficile   . History of kidney stones   .  HYPERTENSION   . LBBB (left bundle branch block)   . Morbid obesity ([place])   . PAF (paroxysmal atrial fibrillation) ([place])    a. Dx 12/2015.    Past Surgical History:  Procedure Laterality Date  . AMPUTATION Left 05/12/2013   Procedure: AMPUTATION RAY;  Surgeon: Newt Minion, MD;  Location: [facility];  Service: Orthopedics;  Laterality: Left;  Left Foot 1st Ray Amputation  . AMPUTATION Left 06/09/2013   Procedure: AMPUTATION BELOW KNEE;  Surgeon: Newt Minion, MD;  Location: [place];  Service: Orthopedics;  Laterality: Left;  Left Below Knee Amputation and removal proximal screws IM tibial nail  . AMPUTATION Right 09/08/2013   Procedure: AMPUTATION BELOW KNEE;  Surgeon: Newt Minion, MD;  Location: [place];  Service: Orthopedics;  Laterality: Right;  Right Below Knee Amputation  . AMPUTATION Right 10/11/2013   Procedure: AMPUTATION BELOW KNEE;  Surgeon: Newt Minion, MD;  Location: [place];  Service: Orthopedics;  Laterality: Right;  Right Below Knee Amputation Revision  . AV FISTULA PLACEMENT  06/14/2012   Procedure: ARTERIOVENOUS (AV) FISTULA CREATION;  Surgeon: Angelia Mould, MD;  [facility];  Service: Vascular;  Laterality: Left;  Left basilic vein transposition with fistula.  . COLONOSCOPY N/A 10/28/2012   Procedure: COLONOSCOPY;  Surgeon: Jeryl Columbia, MD;  Location: [facility] ENDOSCOPY;  Service: Endoscopy;  Laterality: N/A;  . COLONOSCOPY N/A 11/02/2012   Procedure: COLONOSCOPY;  Surgeon: Cleotis Nipper, MD;  Location: [facility] ENDOSCOPY;  Service: Endoscopy;  Laterality: N/A;  . COLONOSCOPY N/A 11/03/2012   Procedure: COLONOSCOPY;  Surgeon: Cleotis Nipper, MD;  Location[facility] ENDOSCOPY;  Service: Endoscopy;  Laterality: N/A;  . COLONOSCOPY N/A 09/16/2016   Procedure: COLONOSCOPY;  Surgeon: Jerene Bears, MD;  Location: [facility] ENDOSCOPY;  Service: Gastroenterology;  Laterality: N/A;  . ENTEROSCOPY N/A 11/08/2012   Procedure: ENTEROSCOPY;  Surgeon: Wonda Horner, MD;  Location: [facility] ENDOSCOPY;  Service:  Endoscopy;  Laterality: N/A;  . ESOPHAGOGASTRODUODENOSCOPY N/A 11/02/2012   Procedure: ESOPHAGOGASTRODUODENOSCOPY (EGD);  Surgeon: Cleotis Nipper, MD;  Location: [facility] ENDOSCOPY;  Service: Endoscopy;  Laterality: N/A;  . ESOPHAGOGASTRODUODENOSCOPY (EGD) WITH PROPOFOL N/A 09/16/2016   Procedure: ESOPHAGOGASTRODUODENOSCOPY (EGD) WITH PROPOFOL;  Surgeon: Jerene Bears, MD;  Location: [facility] ENDOSCOPY;  Service: Gastroenterology;  Laterality: N/A;  . EXCHANGE OF A DIALYSIS CATHETER Left 11/13/2016   Procedure: EXCHANGE OF A DIALYSIS CATHETER;  Surgeon: Elam Dutch, MD;  Location: [facility];  Service: Vascular;  Laterality: Left;  . EYE SURGERY Left    to remove scar tissue  . FRACTURE SURGERY    . GIVENS CAPSULE STUDY N/A 11/04/2012   Procedure: GIVENS CAPSULE STUDY;  Surgeon: Cleotis Nipper, MD;  Location:  [place] ENDOSCOPY;  Service: Endoscopy;  Laterality: N/A;  . GIVENS CAPSULE STUDY N/A 09/29/2016   Procedure: GIVENS CAPSULE STUDY;  Surgeon: Manus Gunning, MD;  Location: [facility];  Service: Gastroenterology;  Laterality: N/A;  try to keep pt up in bedside chair as much as possible during the study.    [name] HARDWARE REMOVAL Left 06/09/2013   Procedure: HARDWARE REMOVAL;  Surgeon: Newt Minion, MD;  Location: [facility];  Service: Orthopedics;  Laterality: Left;  Left Below Knee Amputation  and Removal proximal screws IM tibial nail  . INSERTION OF DIALYSIS CATHETER N/A 11/09/2016   Procedure: INSERTION OF TEMPORARY DIALYSIS CATHETER;  Surgeon: Elam Dutch, MD;  Location: [place];  Service: Vascular;  Laterality: N/A;  . IR GENERIC HISTORICAL  09/27/2016   IR US GUIDE VASC ACCESS RIGHT 09/27/2016 Aletta Edouard, MD [facility]-INTER[facility]  . IR GENERIC HISTORICAL  09/27/2016   IR FLUORO GUIDE CV LINE RIGHT 09/27/2016 Aletta Edouard, MD [facility]  . LIGATION OF ARTERIOVENOUS  FISTULA Left 11/09/2016   Procedure: LIGATION OF ARTERIOVENOUS  FISTULA;  Surgeon: Elam Dutch, MD;  Location: [place];  Service: Vascular;   Laterality: Left;  . NEPHRECTOMY     partial RR  . ORIF FIBULA FRACTURE Left 09/09/2012   Procedure: OPEN REDUCTION INTERNAL FIXATION (ORIF) FIBULA FRACTURE;  Surgeon: Johnny Bridge, MD;  Location: [facility];  Service: Orthopedics;  Laterality: Left;  . TIBIA IM NAIL INSERTION Left 09/09/2012   Procedure: INTRAMEDULLARY (IM) NAIL TIBIAL;  Surgeon: Johnny Bridge, MD;  Location: [place];  Service: Orthopedics;  Laterality: Left;  left tibial nail and open reduction internal fixation left fibula fracture  . UPPER EXTREMITY VENOGRAPHY N/A 12/14/2016   Procedure: Bilateral Upper Extremity Venography and Central Venography;  Surgeon: Conrad [place], MD;  Location: [place] CV LAB;  Service: Cardiovascular;  Laterality: N/A;     Social History:  The patient  reports that he has never smoked. He has never used smokeless tobacco. He reports that he does not drink alcohol or use drugs.   Family History:  The patient's family history includes Coronary artery disease in his other; Diabetes in his mother; Heart attack in his father; Hypertension in his father and mother.    ROS:  Please see the history of present illness. All other systems are reviewed and  Negative to the above problem except as noted.    PHYSICAL EXAM: VS:  BP 138/78   Pulse 79   Ht 5\' 10"  (1.778 m)   Wt 95.3 kg (210 lb)   BMI 30.13 kg/m   GEN: Well nourished, well developed, in no acute distress   Examined in wheelchair HEENT: normal  Neck: no JVD, carotid bruits, or masses Cardiac: RRR; no murmurs, rubs, or gallops,no edema  Respiratory:  clear to auscultation bilaterally, normal work of breathing GI: soft, nontender, nondistended, + BS  No hepatomegaly  MS: s/p bilateral BKA   Skin: warm and dry, no rash Neuro:  Strength and sensation are intact Psych: euthymic mood, full affect   EKG:  EKG is not ordered today.   Lipid Panel    Component Value Date/Time   CHOL 102 (L) 05/14/2016 1634   TRIG 61 05/14/2016 1634    HDL 36 (L) 05/14/2016 1634   CHOLHDL 2.8 05/14/2016 1634   VLDL 12 05/14/2016 1634   LDLCALC 54 05/14/2016 1634      Wt Readings from Last 3 Encounters:  01/22/17 95.3 kg (210 lb)  12/14/16 95.3 kg (210 lb)  12/02/16 95.3 kg (210 lb)      ASSESSMENT AND PLAN:  1  Chronic diastolic CHF  Volume status is OK  Undergoing dialysis   2  HTN  BP is OK  3  PAF  Cliically in SR  Continue current meds   Keep on amio for now 200 per day    4  LBBB  5  Preop eval  From a cardiac standpoint pt is at low risk for major cardiac event with surgery   Will need to have bridging with hx of CVA     6  PVOD  Followed by [facility]  7  HL  Continue statin     Current medicines are reviewed at length with the patient today.  The patient does not have concerns regarding medicines.  Signed, Dorris Carnes, MD  01/22/2017 12:04 PM    [address], [place], [place]  [address] Phone: [phone]; Fax: [phone]

## 2017-01-22 NOTE — Patient Instructions (Signed)
Medication Instructions:  The current medical regimen is effective;  continue present plan and medications.  Follow-Up: Follow up to be determined.  If you need a refill on your cardiac medications before your next appointment, please call your pharmacy.  Thank you for choosing [facility]!!

## 2017-01-23 DIAGNOSIS — E1122 Type 2 diabetes mellitus with diabetic chronic kidney disease: Secondary | ICD-10-CM | POA: Diagnosis not present

## 2017-01-23 DIAGNOSIS — N186 End stage renal disease: Secondary | ICD-10-CM | POA: Diagnosis not present

## 2017-01-23 DIAGNOSIS — N2581 Secondary hyperparathyroidism of renal origin: Secondary | ICD-10-CM | POA: Diagnosis not present

## 2017-01-23 DIAGNOSIS — Z992 Dependence on renal dialysis: Secondary | ICD-10-CM | POA: Diagnosis not present

## 2017-01-25 ENCOUNTER — Telehealth: Payer: Self-pay | Admitting: *Deleted

## 2017-01-25 ENCOUNTER — Encounter: Payer: Self-pay | Admitting: *Deleted

## 2017-01-25 NOTE — Telephone Encounter (Signed)
Per dr Harrington Challenger, the patient is low risk from a cardiac standpoint for cataract extraction with intraocular lens implantation of the right eye. He will continue warfarin. This not will be faxed to the number provided.

## 2017-01-25 NOTE — Telephone Encounter (Signed)
Per dr Harrington Challenger, patient is clear from a cardiac standpoint for cataract ectraction with intraocular lens implantation of the right eye. The patient will continue the   This encounter was created in error - please disregard.

## 2017-01-26 DIAGNOSIS — N186 End stage renal disease: Secondary | ICD-10-CM | POA: Diagnosis not present

## 2017-01-26 DIAGNOSIS — N2581 Secondary hyperparathyroidism of renal origin: Secondary | ICD-10-CM | POA: Diagnosis not present

## 2017-01-26 DIAGNOSIS — E1122 Type 2 diabetes mellitus with diabetic chronic kidney disease: Secondary | ICD-10-CM | POA: Diagnosis not present

## 2017-01-26 DIAGNOSIS — E875 Hyperkalemia: Secondary | ICD-10-CM | POA: Diagnosis not present

## 2017-01-28 DIAGNOSIS — N186 End stage renal disease: Secondary | ICD-10-CM | POA: Diagnosis not present

## 2017-01-28 DIAGNOSIS — E875 Hyperkalemia: Secondary | ICD-10-CM | POA: Diagnosis not present

## 2017-01-28 DIAGNOSIS — E1122 Type 2 diabetes mellitus with diabetic chronic kidney disease: Secondary | ICD-10-CM | POA: Diagnosis not present

## 2017-01-28 DIAGNOSIS — N2581 Secondary hyperparathyroidism of renal origin: Secondary | ICD-10-CM | POA: Diagnosis not present

## 2017-01-30 DIAGNOSIS — E875 Hyperkalemia: Secondary | ICD-10-CM | POA: Diagnosis not present

## 2017-01-30 DIAGNOSIS — N186 End stage renal disease: Secondary | ICD-10-CM | POA: Diagnosis not present

## 2017-01-30 DIAGNOSIS — E1122 Type 2 diabetes mellitus with diabetic chronic kidney disease: Secondary | ICD-10-CM | POA: Diagnosis not present

## 2017-01-30 DIAGNOSIS — N2581 Secondary hyperparathyroidism of renal origin: Secondary | ICD-10-CM | POA: Diagnosis not present

## 2017-02-02 ENCOUNTER — Ambulatory Visit (INDEPENDENT_AMBULATORY_CARE_PROVIDER_SITE_OTHER): Payer: Medicare Other

## 2017-02-02 DIAGNOSIS — G459 Transient cerebral ischemic attack, unspecified: Secondary | ICD-10-CM

## 2017-02-02 DIAGNOSIS — N186 End stage renal disease: Secondary | ICD-10-CM | POA: Diagnosis not present

## 2017-02-02 DIAGNOSIS — Z5181 Encounter for therapeutic drug level monitoring: Secondary | ICD-10-CM | POA: Diagnosis not present

## 2017-02-02 DIAGNOSIS — I638 Other cerebral infarction: Secondary | ICD-10-CM

## 2017-02-02 DIAGNOSIS — E875 Hyperkalemia: Secondary | ICD-10-CM | POA: Diagnosis not present

## 2017-02-02 DIAGNOSIS — I4891 Unspecified atrial fibrillation: Secondary | ICD-10-CM | POA: Diagnosis not present

## 2017-02-02 DIAGNOSIS — E1122 Type 2 diabetes mellitus with diabetic chronic kidney disease: Secondary | ICD-10-CM | POA: Diagnosis not present

## 2017-02-02 DIAGNOSIS — N2581 Secondary hyperparathyroidism of renal origin: Secondary | ICD-10-CM | POA: Diagnosis not present

## 2017-02-02 LAB — POCT INR: INR: 5.1

## 2017-02-03 ENCOUNTER — Ambulatory Visit: Payer: Self-pay | Admitting: Internal Medicine

## 2017-02-04 DIAGNOSIS — N186 End stage renal disease: Secondary | ICD-10-CM | POA: Diagnosis not present

## 2017-02-04 DIAGNOSIS — E1122 Type 2 diabetes mellitus with diabetic chronic kidney disease: Secondary | ICD-10-CM | POA: Diagnosis not present

## 2017-02-04 DIAGNOSIS — E875 Hyperkalemia: Secondary | ICD-10-CM | POA: Diagnosis not present

## 2017-02-04 DIAGNOSIS — N2581 Secondary hyperparathyroidism of renal origin: Secondary | ICD-10-CM | POA: Diagnosis not present

## 2017-02-06 DIAGNOSIS — E1122 Type 2 diabetes mellitus with diabetic chronic kidney disease: Secondary | ICD-10-CM | POA: Diagnosis not present

## 2017-02-06 DIAGNOSIS — N2581 Secondary hyperparathyroidism of renal origin: Secondary | ICD-10-CM | POA: Diagnosis not present

## 2017-02-06 DIAGNOSIS — N186 End stage renal disease: Secondary | ICD-10-CM | POA: Diagnosis not present

## 2017-02-06 DIAGNOSIS — E875 Hyperkalemia: Secondary | ICD-10-CM | POA: Diagnosis not present

## 2017-02-08 ENCOUNTER — Telehealth: Payer: Self-pay | Admitting: Pharmacist

## 2017-02-08 ENCOUNTER — Other Ambulatory Visit: Payer: Self-pay

## 2017-02-08 DIAGNOSIS — H2511 Age-related nuclear cataract, right eye: Secondary | ICD-10-CM | POA: Diagnosis not present

## 2017-02-08 DIAGNOSIS — H25041 Posterior subcapsular polar age-related cataract, right eye: Secondary | ICD-10-CM | POA: Diagnosis not present

## 2017-02-08 NOTE — Telephone Encounter (Signed)
Received fax from Dr. Lianne Moris office that procedure rescheduled for 02/17/17 and will need to hold warfarin 5 days prior. He will need lovenox bridge while warfarin on hold given history of CVA/TIA. Pt with appt in Coumadin clinic tomorrow will coordinate then.

## 2017-02-09 ENCOUNTER — Ambulatory Visit (INDEPENDENT_AMBULATORY_CARE_PROVIDER_SITE_OTHER): Payer: Medicare Other | Admitting: *Deleted

## 2017-02-09 DIAGNOSIS — N186 End stage renal disease: Secondary | ICD-10-CM | POA: Diagnosis not present

## 2017-02-09 DIAGNOSIS — N2581 Secondary hyperparathyroidism of renal origin: Secondary | ICD-10-CM | POA: Diagnosis not present

## 2017-02-09 DIAGNOSIS — E875 Hyperkalemia: Secondary | ICD-10-CM | POA: Diagnosis not present

## 2017-02-09 DIAGNOSIS — E1122 Type 2 diabetes mellitus with diabetic chronic kidney disease: Secondary | ICD-10-CM | POA: Diagnosis not present

## 2017-02-09 DIAGNOSIS — I638 Other cerebral infarction: Secondary | ICD-10-CM

## 2017-02-09 DIAGNOSIS — G459 Transient cerebral ischemic attack, unspecified: Secondary | ICD-10-CM

## 2017-02-09 DIAGNOSIS — Z5181 Encounter for therapeutic drug level monitoring: Secondary | ICD-10-CM

## 2017-02-09 DIAGNOSIS — I4891 Unspecified atrial fibrillation: Secondary | ICD-10-CM

## 2017-02-09 LAB — POCT INR: INR: 2.6

## 2017-02-09 MED ORDER — ENOXAPARIN SODIUM 100 MG/ML ~~LOC~~ SOLN: 100.0000 mg | SUBCUTANEOUS | 1 refills | Status: DC

## 2017-02-09 NOTE — Patient Instructions (Signed)
02/11/17-  Last dose of Coumadin.  02/12/17- No Coumadin or Lovenox.  02/13/17- Inject Lovenox 100mg  in the fatty abdominal tissue at least 2 inches from the belly button once a day about 24 hours apart at 5 am rotate sites. No Coumadin.  02/14/17- Inject Lovenox in the fatty tissue every 24 hours, 5 am No Coumadin.  02/15/17- Inject Lovenox in the fatty tissue every 24 hours, 5 am. No Coumadin.  02/16/17- Inject Lovenox in the fatty tissue in the morning at 5 am.  No Coumadin.  02/17/17- Procedure Day - No Lovenox - Resume Coumadin in the evening or as directed by doctor (take an extra half tablet with usual dose for 2 days then resume normal dose).  02/18/17- Resume Lovenox inject in the fatty tissue every 24 hours at 11am and take Coumadin.  02/19/17- Inject Lovenox in the fatty tissue every 24 hours and take Coumadin.  02/20/17- Inject Lovenox in the fatty tissue every 24  hours and take Coumadin.  02/21/17- Inject Lovenox in the fatty tissue every 24  hours and take Coumadin.  02/22/17- Inject Lovenox in the fatty tissue every 24 hours and take Coumadin.  02/23/17- Coumadin appt to check INR.

## 2017-02-10 ENCOUNTER — Other Ambulatory Visit (INDEPENDENT_AMBULATORY_CARE_PROVIDER_SITE_OTHER): Payer: Medicare Other

## 2017-02-10 ENCOUNTER — Encounter: Payer: Self-pay | Admitting: Internal Medicine

## 2017-02-10 ENCOUNTER — Ambulatory Visit (INDEPENDENT_AMBULATORY_CARE_PROVIDER_SITE_OTHER): Payer: Medicare Other | Admitting: Internal Medicine

## 2017-02-10 VITALS — BP 132/82 | HR 82 | Temp 98.4°F | Resp 20 | Ht 70.0 in | Wt 210.0 lb

## 2017-02-10 DIAGNOSIS — Z0000 Encounter for general adult medical examination without abnormal findings: Secondary | ICD-10-CM | POA: Diagnosis not present

## 2017-02-10 DIAGNOSIS — I110 Hypertensive heart disease with heart failure: Secondary | ICD-10-CM

## 2017-02-10 DIAGNOSIS — I638 Other cerebral infarction: Secondary | ICD-10-CM | POA: Diagnosis not present

## 2017-02-10 DIAGNOSIS — F329 Major depressive disorder, single episode, unspecified: Secondary | ICD-10-CM

## 2017-02-10 DIAGNOSIS — E118 Type 2 diabetes mellitus with unspecified complications: Secondary | ICD-10-CM

## 2017-02-10 DIAGNOSIS — N320 Bladder-neck obstruction: Secondary | ICD-10-CM | POA: Diagnosis not present

## 2017-02-10 DIAGNOSIS — R269 Unspecified abnormalities of gait and mobility: Secondary | ICD-10-CM

## 2017-02-10 DIAGNOSIS — R531 Weakness: Secondary | ICD-10-CM

## 2017-02-10 DIAGNOSIS — F32A Depression, unspecified: Secondary | ICD-10-CM

## 2017-02-10 LAB — CBC WITH DIFFERENTIAL/PLATELET
BASOS PCT: 1 % (ref 0.0–3.0)
Basophils Absolute: 0 10*3/uL (ref 0.0–0.1)
EOS PCT: 1.5 % (ref 0.0–5.0)
Eosinophils Absolute: 0.1 10*3/uL (ref 0.0–0.7)
HCT: 39.2 % (ref 39.0–52.0)
Hemoglobin: 12.5 g/dL — ABNORMAL LOW (ref 13.0–17.0)
LYMPHS ABS: 1.5 10*3/uL (ref 0.7–4.0)
Lymphocytes Relative: 29.2 % (ref 12.0–46.0)
MCHC: 31.9 g/dL (ref 30.0–36.0)
MCV: 87.1 fl (ref 78.0–100.0)
MONOS PCT: 8.5 % (ref 3.0–12.0)
Monocytes Absolute: 0.4 10*3/uL (ref 0.1–1.0)
NEUTROS PCT: 59.8 % (ref 43.0–77.0)
Neutro Abs: 3 10*3/uL (ref 1.4–7.7)
Platelets: 214 10*3/uL (ref 150.0–400.0)
RBC: 4.5 Mil/uL (ref 4.22–5.81)
RDW: 20.3 % — AB (ref 11.5–15.5)
WBC: 5 10*3/uL (ref 4.0–10.5)

## 2017-02-10 NOTE — Progress Notes (Signed)
Pre visit review using our clinic review tool, if applicable. No additional management support is needed unless otherwise documented below in the visit note. 

## 2017-02-10 NOTE — Progress Notes (Signed)
Subjective:    Patient ID: Oscar Castillo, male    DOB: [birth year], 66 y.o.   MRN: [id number]  HPI  Here for yearly f/u; remains on HD tues-thur-sat, Overall doing ok;  Pt denies Chest pain, worsening SOB, DOE, wheezing, orthopnea, PND, worsening LE edema, palpitations, dizziness or syncope.  Pt denies neurological change such as new headache, facial or extremity weakness.  Pt denies polydipsia, polyuria   Pt states overall fair to good compliance with treatment and medications, good tolerability, and has been trying to follow appropriate diet.  Pt denies worsening depressive symptoms but has occasional down days, but no suicidal ideation or panic. No fever, night sweats, wt loss, loss of appetite, or other constitutional symptoms.  Pt states fair ability with ADL's, has mod to higher fall risk, asks for new wheelchair order, no other significant changes in hearing or vision. Past Medical History:  Diagnosis Date  . Anemia   . Antral ulcer 2014   small  . Atrial fibrillation ([place])   . Atrial fibrillation with RVR ([place]) 10/07/2016  . BACK PAIN, LUMBAR, CHRONIC   . BENIGN PROSTATIC HYPERTROPHY   . Bipolar disorder ([place])    "sometimes" (10/07/2016)  . CHOLELITHIASIS   . Chronic combined systolic and diastolic CHF (congestive heart failure) ([place])   . Complication of anesthesia    wife states pt had trouble waking up with in Nov., 2014  . CVA (cerebral vascular accident) ([place]) 07/2007  . DEPRESSION   . DIABETES MELLITUS, TYPE II   . ERECTILE DYSFUNCTION   . ESRD (end stage renal disease) on dialysis [facility])    "[place]; TTS" (10/07/2016)  . ESRD on hemodialysis ([place])    ESRD due to DM/HTN. Started dialysis in November 2013.  HD TTS at [facility] on [address].  [name] GERD   . GI bleed    due to gastritis, discharged 10/02/16/notes 10/07/2016  . Headache    "monthly" (10/07/2016)  . Hemorrhoids   . Hepatitis C    C  . History of blood transfusion    "related to dialysis" (10/07/2016)  . History  of Clostridium difficile   . History of kidney stones   . HYPERTENSION   . LBBB (left bundle branch block)   . Morbid obesity ([place])   . PAF (paroxysmal atrial fibrillation) ([place])    a. Dx 12/2015.   Past Surgical History:  Procedure Laterality Date  . AMPUTATION Left 05/12/2013   Procedure: AMPUTATION RAY;  Surgeon: Newt Minion, MD;  Location: [place];  Service: Orthopedics;  Laterality: Left;  Left Foot 1st Ray Amputation  . AMPUTATION Left 06/09/2013   Procedure: AMPUTATION BELOW KNEE;  Surgeon: Newt Minion, MD;  Location: [place];  Service: Orthopedics;  Laterality: Left;  Left Below Knee Amputation and removal proximal screws IM tibial nail  . AMPUTATION Right 09/08/2013   Procedure: AMPUTATION BELOW KNEE;  Surgeon: Newt Minion, MD;  Location: [place];  Service: Orthopedics;  Laterality: Right;  Right Below Knee Amputation  . AMPUTATION Right 10/11/2013   Procedure: AMPUTATION BELOW KNEE;  Surgeon: Newt Minion, MD;  Location: [place];  Service: Orthopedics;  Laterality: Right;  Right Below Knee Amputation Revision  . AV FISTULA PLACEMENT  06/14/2012   Procedure: ARTERIOVENOUS (AV) FISTULA CREATION;  Surgeon: Angelia Mould, MD;  Location: [facility] OR;  Service: Vascular;  Laterality: Left;  Left basilic vein transposition with fistula.  . COLONOSCOPY N/A 10/28/2012   Procedure: COLONOSCOPY;  Surgeon: Altamese Dilling  Drema Balzarine, MD;  Location: [place] ENDOSCOPY;  Service: Endoscopy;  Laterality: N/A;  . COLONOSCOPY N/A 11/02/2012   Procedure: COLONOSCOPY;  Surgeon: Cleotis Nipper, MD;  Location: [facility] ENDOSCOPY;  Service: Endoscopy;  Laterality: N/A;  . COLONOSCOPY N/A 11/03/2012   Procedure: COLONOSCOPY;  Surgeon: Cleotis Nipper, MD;  Location: [facility] ENDOSCOPY;  Service: Endoscopy;  Laterality: N/A;  . COLONOSCOPY N/A 09/16/2016   Procedure: COLONOSCOPY;  Surgeon: Jerene Bears, MD;  Location: [facility] ENDOSCOPY;  Service: Gastroenterology;  Laterality: N/A;  . ENTEROSCOPY N/A 11/08/2012   Procedure: ENTEROSCOPY;   Surgeon: Wonda Horner, MD;  Location: [facility] ENDOSCOPY;  Service: Endoscopy;  Laterality: N/A;  . ESOPHAGOGASTRODUODENOSCOPY N/A 11/02/2012   Procedure: ESOPHAGOGASTRODUODENOSCOPY (EGD);  Surgeon: Cleotis Nipper, MD;  Location: [facility] ENDOSCOPY;  Service: Endoscopy;  Laterality: N/A;  . ESOPHAGOGASTRODUODENOSCOPY (EGD) WITH PROPOFOL N/A 09/16/2016   Procedure: ESOPHAGOGASTRODUODENOSCOPY (EGD) WITH PROPOFOL;  Surgeon: Jerene Bears, MD;  Location: [facility] ENDOSCOPY;  Service: Gastroenterology;  Laterality: N/A;  . EXCHANGE OF A DIALYSIS CATHETER Left 11/13/2016   Procedure: EXCHANGE OF A DIALYSIS CATHETER;  Surgeon: Elam Dutch, MD;  Location: [place];  Service: Vascular;  Laterality: Left;  . EYE SURGERY Left    to remove scar tissue  . FRACTURE SURGERY    . GIVENS CAPSULE STUDY N/A 11/04/2012   Procedure: GIVENS CAPSULE STUDY;  Surgeon: Cleotis Nipper, MD;  Location: [facility] ENDOSCOPY;  Service: Endoscopy;  Laterality: N/A;  . GIVENS CAPSULE STUDY N/A 09/29/2016   Procedure: GIVENS CAPSULE STUDY;  Surgeon: Manus Gunning, MD;  Location: [place];  Service: Gastroenterology;  Laterality: N/A;  try to keep pt up in bedside chair as much as possible during the study.    [name] HARDWARE REMOVAL Left 06/09/2013   Procedure: HARDWARE REMOVAL;  Surgeon: Newt Minion, MD;  Location: [place];  Service: Orthopedics;  Laterality: Left;  Left Below Knee Amputation  and Removal proximal screws IM tibial nail  . INSERTION OF DIALYSIS CATHETER N/A 11/09/2016   Procedure: INSERTION OF TEMPORARY DIALYSIS CATHETER;  Surgeon: Elam Dutch, MD;  Location: [place];  Service: Vascular;  Laterality: N/A;  . IR GENERIC HISTORICAL  09/27/2016   IR US GUIDE VASC ACCESS RIGHT 09/27/2016 Aletta Edouard, MD [facility]  . IR GENERIC HISTORICAL  09/27/2016   IR FLUORO GUIDE CV LINE RIGHT 09/27/2016 Aletta Edouard, MD [facility]  . LIGATION OF ARTERIOVENOUS  FISTULA Left 11/09/2016   Procedure: LIGATION OF ARTERIOVENOUS  FISTULA;   Surgeon: Elam Dutch, MD;  Location: [place];  Service: Vascular;  Laterality: Left;  . NEPHRECTOMY     partial RR  . ORIF FIBULA FRACTURE Left 09/09/2012   Procedure: OPEN REDUCTION INTERNAL FIXATION (ORIF) FIBULA FRACTURE;  Surgeon: Johnny Bridge, MD;  Location: [place];  Service: Orthopedics;  Laterality: Left;  . TIBIA IM NAIL INSERTION Left 09/09/2012   Procedure: INTRAMEDULLARY (IM) NAIL TIBIAL;  Surgeon: Johnny Bridge, MD;  Location: [place];  Service: Orthopedics;  Laterality: Left;  left tibial nail and open reduction internal fixation left fibula fracture  . UPPER EXTREMITY VENOGRAPHY N/A 12/14/2016   Procedure: Bilateral Upper Extremity Venography and Central Venography;  Surgeon: Conrad [place], MD;  Location: [place] CV LAB;  Service: Cardiovascular;  Laterality: N/A;    reports that he has never smoked. He has never used smokeless tobacco. He reports that he does not drink alcohol or use drugs. family history includes Coronary artery disease in his  other; Diabetes in his mother; Heart attack in his father; Hypertension in his father and mother. Allergies  Allergen Reactions  . No Known Allergies    Current Outpatient Prescriptions on File Prior to Visit  Medication Sig Dispense Refill  . acetaminophen (TYLENOL) 500 MG tablet Take 500 mg by mouth every 6 (six) hours as needed for mild pain.    [name] amiodarone (PACERONE) 200 MG tablet TAKE 1 TABLET BY MOUTH DAILY, START THIS ON 11/06/16 90 tablet 3  . atorvastatin (LIPITOR) 40 MG tablet TAKE 1 TABLET(40 MG) BY MOUTH DAILY AT 6 PM 30 tablet 4  . calcium acetate (PHOSLO) 667 MG capsule Take 1,334 mg by mouth 3 (three) times daily with meals.     . enoxaparin (LOVENOX) 100 MG/ML injection Inject 1 mL (100 mg total) into the skin daily. 10 Syringe 1  . latanoprost (XALATAN) 0.005 % ophthalmic solution Place 1 drop into both eyes at bedtime.     . pantoprazole (PROTONIX) 40 MG tablet Take 1 tablet (40 mg total) by mouth 2 (two) times  daily before a meal. 60 tablet 11  . QUEtiapine (SEROQUEL) 25 MG tablet TAKE 1 TABLET(25 MG) BY MOUTH TWICE DAILY 60 tablet 8  . SENSIPAR 30 MG tablet Take 30 mg by mouth every evening.     . silver sulfADIAZINE (SILVADENE) 1 % cream Apply 1 application topically daily as needed (irratation). (Patient taking differently: Apply 1 application topically daily as needed (irritation or itching). )    . warfarin (COUMADIN) 2.5 MG tablet Take 1 tablet daily except 1.5 tablets on Wed and Fri or as directed by Anticoagulation Clinic 40 tablet 3   No current facility-administered medications on file prior to visit.    Review of Systems  Constitutional: Negative for other unusual diaphoresis or sweats HENT: Negative for ear discharge or swelling Eyes: Negative for other worsening visual disturbances Respiratory: Negative for stridor or other swelling  Gastrointestinal: Negative for worsening distension or other blood Genitourinary: Negative for retention or other urinary change Musculoskeletal: Negative for other MSK pain or swelling Skin: Negative for color change or other new lesions Neurological: Negative for worsening tremors and other numbness  Psychiatric/Behavioral: Negative for worsening agitation All other system neg per pt    Objective:   Physical Exam BP 132/82   Pulse 82   Temp 98.4 F (36.9 C)   Resp 20   Ht 5\' 10"  (1.778 m)   Wt 210 lb (95.3 kg)   SpO2 99%   BMI 30.13 kg/m  VS noted,  Constitutional: Pt appears in NAD HENT: Head: NCAT.  Right Ear: External ear normal.  Left Ear: External ear normal.  Eyes: . Pupils are equal, round, and reactive to light. Conjunctivae and EOM are normal Nose: without d/c or deformity Neck: Neck supple. Gross normal ROM Cardiovascular: Normal rate and regular rhythm.   Pulmonary/Chest: Effort normal and breath sounds without rales or wheezing.  Abd:  Soft, NT, ND, + BS, no organomegaly Neurological: Pt is alert. At baseline orientation,  motor grossly intact Skin: Skin is warm. No rashes, other new lesions, no LE edema S/p bilat leg amputations with prosthesis Psychiatric: Pt behavior is normal without agitation  No other exam findings Lab Results  Component Value Date   WBC 5.0 02/10/2017   HGB 12.5 (L) 02/10/2017   HCT 39.2 02/10/2017   PLT 214.0 02/10/2017   GLUCOSE 199 (H) 02/10/2017   CHOL 115 02/10/2017   TRIG 48.0 02/10/2017   HDL 39.80  02/10/2017   LDLCALC 65 02/10/2017   ALT 9 02/10/2017   AST 12 02/10/2017   NA 136 02/10/2017   K 5.3 (H) 02/10/2017   CL 97 02/10/2017   CREATININE 10.02 (HH) 02/10/2017   BUN 62 (H) 02/10/2017   CO2 19 02/10/2017   TSH 1.16 02/10/2017   PSA 0.55 02/10/2017   INR 2.6 02/09/2017   HGBA1C 7.1 (H) 02/10/2017   MICROALBUR 220.0 (H) 08/06/2009      Assessment & Plan:

## 2017-02-10 NOTE — Patient Instructions (Addendum)
Continue doing brain stimulating activities (puzzles, reading, adult Coloring books, staying active) to keep memory sharp.   Please continue all other medications as before, and refills have been done if requested.  Please have the pharmacy call with any other refills you may need.  Please continue your efforts at being more active, low cholesterol diet, and weight control.  You are otherwise up to date with prevention measures today.  Please keep your appointments with your specialists as you may have planned  You will be contacted regarding the referral for: [place] with PT  You are given the prescription for the Wheelchair to give to [place] when arrives  Please go to the LAB in the Basement (turn left off the elevator) for the tests to be done today  You will be contacted by phone if any changes need to be made immediately.  Otherwise, you will receive a letter about your results with an explanation, but please check with MyChart first.  Please remember to sign up for MyChart if you have not done so, as this will be important to you in the future with finding out test results, communicating by private email, and scheduling acute appointments online when needed.  Please return in 6 months, or sooner if needed

## 2017-02-10 NOTE — Progress Notes (Signed)
Subjective:   Oscar Castillo is a 66 y.o. male who presents for an Initial Medicare Annual Wellness Visit.  Review of Systems  No ROS.  Medicare Wellness Visit. Additional risk factors are reflected in the social history.  Cardiac Risk Factors include: advanced age (>4men, >60 women);diabetes mellitus;hypertension;male gender Sleep patterns: feels rested on waking, does not get up to void and sleeps 6-8 hours nightly.    Home Safety/Smoke Alarms: Feels safe in home. Smoke alarms in place.  Living environment; residence and Firearm Safety: 1-story house/ trailer, no firearms. Lives with wife, support system in place  Seat Belt Safety/Bike Helmet: Wears seat belt.   Counseling:   Eye Exam- appointment yearly  Dental- appointment every 6 months  Male:   CCS- Last 2/21/218, polyp, N/D for recall   PSA-  Lab Results  Component Value Date   PSA 0.60 05/06/2011   PSA 0.38 08/06/2009   PSA 0.22 05/05/2007      Objective:    Today's Vitals   02/10/17 1532  BP: 132/82  Pulse: 82  Resp: 20  Temp: 98.4 F (36.9 C)  SpO2: 99%  Weight: 210 lb (95.3 kg)  Height: 5\' 10"  (1.778 m)   Body mass index is 30.13 kg/m.  Current Medications (verified) Outpatient Encounter Prescriptions as of 02/10/2017  Medication Sig  . acetaminophen (TYLENOL) 500 MG tablet Take 500 mg by mouth every 6 (six) hours as needed for mild pain.  [name] amiodarone (PACERONE) 200 MG tablet TAKE 1 TABLET BY MOUTH DAILY, START THIS ON 11/06/16  . atorvastatin (LIPITOR) 40 MG tablet TAKE 1 TABLET(40 MG) BY MOUTH DAILY AT 6 PM  . BESIVANCE 0.6 % SUSP PLACE 1 GTT IN LEFT EYE TID  . calcium acetate (PHOSLO) 667 MG capsule Take 1,334 mg by mouth 3 (three) times daily with meals.   . enoxaparin (LOVENOX) 100 MG/ML injection Inject 1 mL (100 mg total) into the skin daily.  [name] latanoprost (XALATAN) 0.005 % ophthalmic solution Place 1 drop into both eyes at bedtime.   . pantoprazole (PROTONIX) 40 MG tablet Take 1 tablet  (40 mg total) by mouth 2 (two) times daily before a meal.  . PROLENSA 0.07 % SOLN PLACE 1 GTT IN LEFT EYE HS  . QUEtiapine (SEROQUEL) 25 MG tablet TAKE 1 TABLET(25 MG) BY MOUTH TWICE DAILY  . SENSIPAR 30 MG tablet Take 30 mg by mouth every evening.   . silver sulfADIAZINE (SILVADENE) 1 % cream Apply 1 application topically daily as needed (irratation). (Patient taking differently: Apply 1 application topically daily as needed (irritation or itching). )  . warfarin (COUMADIN) 2.5 MG tablet Take 1 tablet daily except 1.5 tablets on Wed and Fri or as directed by Anticoagulation Clinic   No facility-administered encounter medications on file as of 02/10/2017.     Allergies (verified) No known allergies   History: Past Medical History:  Diagnosis Date  . Anemia   . Antral ulcer 2014   small  . Atrial fibrillation ([place])   . Atrial fibrillation with RVR ([place]) 10/07/2016  . BACK PAIN, LUMBAR, CHRONIC   . BENIGN PROSTATIC HYPERTROPHY   . Bipolar disorder ([place])    "sometimes" (10/07/2016)  . CHOLELITHIASIS   . Chronic combined systolic and diastolic CHF (congestive heart failure) ([place])   . Complication of anesthesia    wife states pt had trouble waking up with in Nov., 2014  . CVA (cerebral vascular accident) ([place]) 07/2007  . DEPRESSION   . DIABETES MELLITUS, TYPE  II   . ERECTILE DYSFUNCTION   . ESRD (end stage renal disease) on dialysis [facility])    "[place]; TTS" (10/07/2016)  . ESRD on hemodialysis ([place])    ESRD due to DM/HTN. Started dialysis in November 2013.  HD TTS at [facility] on [address].  [name] GERD   . GI bleed    due to gastritis, discharged 10/02/16/notes 10/07/2016  . Headache    "monthly" (10/07/2016)  . Hemorrhoids   . Hepatitis C    C  . History of blood transfusion    "related to dialysis" (10/07/2016)  . History of Clostridium difficile   . History of kidney stones   . HYPERTENSION   . LBBB (left bundle branch block)   . Morbid obesity ([place])   . PAF (paroxysmal atrial  fibrillation) ([place])    a. Dx 12/2015.   Past Surgical History:  Procedure Laterality Date  . AMPUTATION Left 05/12/2013   Procedure: AMPUTATION RAY;  Surgeon: Newt Minion, MD;  Location: [place];  Service: Orthopedics;  Laterality: Left;  Left Foot 1st Ray Amputation  . AMPUTATION Left 06/09/2013   Procedure: AMPUTATION BELOW KNEE;  Surgeon: Newt Minion, MD;  Location: [place];  Service: Orthopedics;  Laterality: Left;  Left Below Knee Amputation and removal proximal screws IM tibial nail  . AMPUTATION Right 09/08/2013   Procedure: AMPUTATION BELOW KNEE;  Surgeon: Newt Minion, MD;  Location: [place];  Service: Orthopedics;  Laterality: Right;  Right Below Knee Amputation  . AMPUTATION Right 10/11/2013   Procedure: AMPUTATION BELOW KNEE;  Surgeon: Newt Minion, MD;  Location: [place];  Service: Orthopedics;  Laterality: Right;  Right Below Knee Amputation Revision  . AV FISTULA PLACEMENT  06/14/2012   Procedure: ARTERIOVENOUS (AV) FISTULA CREATION;  Surgeon: Angelia Mould, MD;  Location: [facility] OR;  Service: Vascular;  Laterality: Left;  Left basilic vein transposition with fistula.  . COLONOSCOPY N/A 10/28/2012   Procedure: COLONOSCOPY;  Surgeon: Jeryl Columbia, MD;  Location: [facility] ENDOSCOPY;  Service: Endoscopy;  Laterality: N/A;  . COLONOSCOPY N/A 11/02/2012   Procedure: COLONOSCOPY;  Surgeon: Cleotis Nipper, MD;  Location: [facility] ENDOSCOPY;  Service: Endoscopy;  Laterality: N/A;  . COLONOSCOPY N/A 11/03/2012   Procedure: COLONOSCOPY;  Surgeon: Cleotis Nipper, MD;  Location[facility] END[facility]COPY;  Service: Endoscopy;  Laterality: N/A;  . COLONOSCOPY N/A 09/16/2016   Procedure: COLONOSCOPY;  Surgeon: Jerene Bears, MD;  Location: [facility] ENDOSCOPY;  Service: Gastroenterology;  Laterality: N/A;  . ENTEROSCOPY N/A 11/08/2012   Procedure: ENTEROSCOPY;  Surgeon: Wonda Horner, MD;  Location: [facility] ENDOSCOPY;  Service: Endoscopy;  Laterality: N/A;  . ESOPHAGOGASTRODUODENOSCOPY N/A 11/02/2012   Procedure:  ESOPHAGOGASTRODUODENOSCOPY (EGD);  Surgeon: Cleotis Nipper, MD;  Location: [facility] ENDOSCOPY;  Service: Endoscopy;  Laterality: N/A;  . ESOPHAGOGASTRODUODENOSCOPY (EGD) WITH PROPOFOL N/A 09/16/2016   Procedure: ESOPHAGOGASTRODUODENOSCOPY (EGD) WITH PROPOFOL;  Surgeon: Jerene Bears, MD;  Location: [facility] ENDOSCOPY;  Service: Gastroenterology;  Laterality: N/A;  . EXCHANGE OF A DIALYSIS CATHETER Left 11/13/2016   Procedure: EXCHANGE OF A DIALYSIS CATHETER;  Surgeon: Elam Dutch, MD;  Location: [place];  Service: Vascular;  Laterality: Left;  . EYE SURGERY Left    to remove scar tissue  . FRACTURE SURGERY    . GIVENS CAPSULE STUDY N/A 11/04/2012   Procedure: GIVENS CAPSULE STUDY;  Surgeon: Cleotis Nipper, MD;  Location: [facility] ENDOSCOPY;  Service: Endoscopy;  Laterality: N/A;  . GIVENS CAPSULE STUDY N/A 09/29/2016  Procedure: GIVENS CAPSULE STUDY;  Surgeon: Manus Gunning, MD;  Location: [facility] ENDOSCOPY;  Service: Gastroenterology;  Laterality: N/A;  try to keep pt up in bedside chair as much as possible during the study.    [name] HARDWARE REMOVAL Left 06/09/2013   Procedure: HARDWARE REMOVAL;  Surgeon: Newt Minion, MD;  Location: [place];  Service: Orthopedics;  Laterality: Left;  Left Below Knee Amputation  and Removal proximal screws IM tibial nail  . INSERTION OF DIALYSIS CATHETER N/A 11/09/2016   Procedure: INSERTION OF TEMPORARY DIALYSIS CATHETER;  Surgeon: Elam Dutch, MD;  Location: [place];  Service: Vascular;  Laterality: N/A;  . IR GENERIC HISTORICAL  09/27/2016   IR US GUIDE VASC ACCESS RIGHT 09/27/2016 Aletta Edouard, MD [facility]-INTER[facility] RAD  . IR GENERIC HISTORICAL  09/27/2016   IR FLUORO GUIDE CV LINE RIGHT 09/27/2016 Aletta Edouard, MD [facility]  . LIGATION OF ARTERIOVENOUS  FISTULA Left 11/09/2016   Procedure: LIGATION OF ARTERIOVENOUS  FISTULA;  Surgeon: Elam Dutch, MD;  Location: [place];  Service: Vascular;  Laterality: Left;  . NEPHRECTOMY     partial RR  . ORIF FIBULA FRACTURE Left  09/09/2012   Procedure: OPEN REDUCTION INTERNAL FIXATION (ORIF) FIBULA FRACTURE;  Surgeon: Johnny Bridge, MD;  Location: [place];  Service: Orthopedics;  Laterality: Left;  . TIBIA IM NAIL INSERTION Left 09/09/2012   Procedure: INTRAMEDULLARY (IM) NAIL TIBIAL;  Surgeon: Johnny Bridge, MD;  Location: [place];  Service: Orthopedics;  Laterality: Left;  left tibial nail and open reduction internal fixation left fibula fracture  . UPPER EXTREMITY VENOGRAPHY N/A 12/14/2016   Procedure: Bilateral Upper Extremity Venography and Central Venography;  Surgeon: Conrad [place], MD;  Location: [place] CV LAB;  Service: Cardiovascular;  Laterality: N/A;   Family History  Problem Relation Age of Onset  . Diabetes Mother   . Hypertension Mother   . Heart attack Father   . Hypertension Father   . Coronary artery disease Other    Social History   Occupational History  . disabled due to stroke Retired   Social History Main Topics  . Smoking status: Never Smoker  . Smokeless tobacco: Never Used  . Alcohol use No  . Drug use: No  . Sexual activity: Yes    Birth control/ protection: None   Tobacco Counseling Counseling given: Not Answered   Activities of Daily Living In your present state of health, do you have any difficulty performing the following activities: 02/10/2017 12/14/2016  Hearing? N N  Vision? N N  Difficulty concentrating or making decisions? N N  Walking or climbing stairs? Y Y  Dressing or bathing? Y Y  Doing errands, shopping? Y -  Conservation officer, nature and eating ? Y -  Using the Toilet? Y -  In the past six months, have you accidently leaked urine? N -  Do you have problems with loss of bowel control? N -  Managing your Medications? N -  Managing your Finances? N -  Housekeeping or managing your Housekeeping? Y -  Some recent data might be hidden    Immunizations and Health Maintenance Immunization History  Administered Date(s) Administered  . Hepatitis A, Adult 07/10/2015    . Hepatitis B, adult 07/10/2015  . Influenza, Seasonal, Injecte, Preservative Fre 04/11/2015  . Influenza-Unspecified 10/04/2013, 04/26/2014   Health Maintenance Due  Topic Date Due  . URINE MICROALBUMIN  08/06/2010  . OPHTHALMOLOGY EXAM  01/18/2016    Patient Care Team: [name]  W, MD as PCP - General Fay Records, MD as Consulting Physician (Cardiology) Conrad Belk, MD as Consulting Physician (Vascular Surgery)  Indicate any recent Medical Services you may have received from other than [facility] providers in the past year (date may be approximate).    Assessment:   This is a routine wellness examination for [place]. Physical assessment deferred to PCP.   Hearing/Vision screen Hearing Screening Comments: Able to hear conversational tones w/o difficulty. No issues reported.  Passed whisper test Vision Screening Comments: S/P cataract surgery, followed by opthalmology for glaucoma  Dietary issues and exercise activities discussed: Current Exercise Habits: Home exercise routine (chair exercise pamphlet provided), Type of exercise: stretching (recently completed PT and continue to do exercises), Time (Minutes): 30, Frequency (Times/Week): 5, Weekly Exercise (Minutes/Week): 150, Intensity: Mild, Exercise limited by: Other - see comments (bilateral amputee)  Diet (meal preparation, eat out, water intake, caffeinated beverages, dairy products, fruits and vegetables): in general, a "healthy" diet  , low salt, patient is on a 40 cc per day fluid restriction and renal diet restriction due to renal failure and being on dialysis. Patient states that he is compliant and does not need any further diet education  Goals    . Continue to work to increase my mobility, get the addition to my prosthetic legs to look more natural and help balance me so I can increase my mobility      Depression Screen PHQ 2/9 Scores 02/10/2017 03/11/2016 10/07/2015 07/10/2015  PHQ - 2 Score 3 0 0 0  PHQ- 9 Score 7 -  - -    Fall Risk Fall Risk  02/10/2017 03/11/2016 10/07/2015 07/10/2015 01/24/2015  Falls in the past year? No No No No No  Number falls in past yr: - - - - -  Risk Factor Category  - - - - -  Risk for fall due to : Impaired mobility;Impaired balance/gait - - Impaired mobility -    Cognitive Function:       Ad8 score reviewed for issues:  Issues making decisions: no  Less interest in hobbies / activities: no  Repeats questions, stories (family complaining): no  Trouble using ordinary gadgets (microwave, computer, phone):no  Forgets the month or year: no  Mismanaging finances: no  Remembering appts: no  Daily problems with thinking and/or memory: no Ad8 score is= 0    Screening Tests Health Maintenance  Topic Date Due  . URINE MICROALBUMIN  08/06/2010  . OPHTHALMOLOGY EXAM  01/18/2016  . PNA vac Low Risk Adult (1 of 2 - PCV13) 02/10/2018 (Originally 08/24/2016)  . FOOT EXAM  02/11/2019 (Originally 02/20/2014)  . INFLUENZA VACCINE  02/24/2017  . HEMOGLOBIN A1C  03/15/2017  . TETANUS/TDAP  07/27/2018  . COLONOSCOPY  09/16/2026  . Hepatitis C Screening  Completed  . HIV Screening  Completed        Plan:    Continue doing brain stimulating activities (puzzles, reading, adult coloring books, staying active) to keep memory sharp.   I have personally reviewed and noted the following in the patient's chart:   . Medical and social history . Use of alcohol, tobacco or illicit drugs  . Current medications and supplements . Functional ability and status . Nutritional status . Physical activity . Advanced directives . List of other physicians . Vitals . Screenings to include cognitive, depression, and falls . Referrals and appointments  In addition, I have reviewed and discussed with patient certain preventive protocols, quality metrics, and best practice recommendations. A  written personalized care plan for preventive services as well as general preventive health  recommendations were provided to patient.     Michiel Cowboy, RN   02/10/2017    Medical screening examination/treatment/procedure(s) were performed by non-physician practitioner and as supervising physician I was immediately available for consultation/collaboration. I agree with above. Michiel Cowboy, RN  Medical screening examination/treatment/procedure(s) were performed by non-physician practitioner and as supervising physician I was immediately available for consultation/collaboration. I agree with above. [name], MD

## 2017-02-11 ENCOUNTER — Other Ambulatory Visit: Payer: Self-pay

## 2017-02-11 ENCOUNTER — Encounter: Payer: Self-pay | Admitting: Internal Medicine

## 2017-02-11 DIAGNOSIS — E1122 Type 2 diabetes mellitus with diabetic chronic kidney disease: Secondary | ICD-10-CM | POA: Diagnosis not present

## 2017-02-11 DIAGNOSIS — N186 End stage renal disease: Secondary | ICD-10-CM | POA: Diagnosis not present

## 2017-02-11 DIAGNOSIS — E875 Hyperkalemia: Secondary | ICD-10-CM | POA: Diagnosis not present

## 2017-02-11 DIAGNOSIS — N2581 Secondary hyperparathyroidism of renal origin: Secondary | ICD-10-CM | POA: Diagnosis not present

## 2017-02-11 LAB — LIPID PANEL
CHOL/HDL RATIO: 3
Cholesterol: 115 mg/dL (ref 0–200)
HDL: 39.8 mg/dL (ref >39.00)
LDL CALC: 65 mg/dL (ref 0–99)
NONHDL: 74.9
Triglycerides: 48 mg/dL (ref 0.0–149.0)
VLDL: 9.6 mg/dL (ref 0.0–40.0)

## 2017-02-11 LAB — HEPATIC FUNCTION PANEL
ALBUMIN: 3.9 g/dL (ref 3.5–5.2)
ALK PHOS: 55 U/L (ref 39–117)
ALT: 9 U/L (ref 0–53)
AST: 12 U/L (ref 0–37)
BILIRUBIN DIRECT: 0 mg/dL (ref 0.0–0.3)
BILIRUBIN TOTAL: 0.3 mg/dL (ref 0.2–1.2)
Total Protein: 7.4 g/dL (ref 6.0–8.3)

## 2017-02-11 LAB — BASIC METABOLIC PANEL
BUN: 62 mg/dL — ABNORMAL HIGH (ref 6–23)
CALCIUM: 9 mg/dL (ref 8.4–10.5)
CO2: 19 mEq/L (ref 19–32)
Chloride: 97 mEq/L (ref 96–112)
Creatinine, Ser: 10.02 mg/dL (ref 0.40–1.50)
GFR: 6.74 mL/min — AB (ref >60.00)
Glucose, Bld: 199 mg/dL — ABNORMAL HIGH (ref 70–99)
POTASSIUM: 5.3 meq/L — AB (ref 3.5–5.1)
SODIUM: 136 meq/L (ref 135–145)

## 2017-02-11 LAB — HEMOGLOBIN A1C: Hgb A1c MFr Bld: 7.1 % — ABNORMAL HIGH (ref 4.6–6.5)

## 2017-02-11 LAB — TSH: TSH: 1.16 u[IU]/mL (ref 0.35–4.50)

## 2017-02-11 LAB — PSA: PSA: 0.55 ng/mL (ref 0.10–4.00)

## 2017-02-13 DIAGNOSIS — E875 Hyperkalemia: Secondary | ICD-10-CM | POA: Diagnosis not present

## 2017-02-13 DIAGNOSIS — N2581 Secondary hyperparathyroidism of renal origin: Secondary | ICD-10-CM | POA: Diagnosis not present

## 2017-02-13 DIAGNOSIS — N186 End stage renal disease: Secondary | ICD-10-CM | POA: Diagnosis not present

## 2017-02-13 DIAGNOSIS — E1122 Type 2 diabetes mellitus with diabetic chronic kidney disease: Secondary | ICD-10-CM | POA: Diagnosis not present

## 2017-02-13 NOTE — Assessment & Plan Note (Addendum)
Ok for [facility] with PT to restart, as well as rx for wheelchair  Note:  Total time for pt hx, exam, review of record with pt in the room, determination of diagnoses and plan for further eval and tx is > 40 min, with over 50% spent in coordination and counseling of patient, including the differential dx, further evaluation and tx management of his gait d/o, general weakenss, depression, DM and HTN

## 2017-02-13 NOTE — Assessment & Plan Note (Signed)
Declines need for change in tx,,  to f/u any worsening symptoms or concerns, declines counseling referral

## 2017-02-13 NOTE — Assessment & Plan Note (Signed)
stable overall by history and exam, recent data as documnented reviewed with pt, and pt to continue medical treatment as before,  to f/u any worsening symptoms or concerns

## 2017-02-13 NOTE — Assessment & Plan Note (Signed)
Due to multiple comorbids,  to f/u any worsening symptoms or concerns

## 2017-02-13 NOTE — Assessment & Plan Note (Signed)
stable overall by history and exam, recent data reviewed with pt, and pt to continue medical treatment as before,  to f/u any worsening symptoms or concerns Lab Results  Component Value Date   HGBA1C 7.1 (H) 02/10/2017

## 2017-02-15 ENCOUNTER — Encounter (HOSPITAL_COMMUNITY): Payer: Self-pay | Admitting: *Deleted

## 2017-02-15 NOTE — Progress Notes (Signed)
Spoke with pt for pre-op call. Pt has hx of A-fib. Has Cardiac clearance in EPIC dated 01/22/17 by Dr. Dorris Carnes. Pt denies any recent chest pain or sob. Pt is a type 2 diabetic. Does not take medication for it. States his fasting blood sugar is usually less than 200, one day recently it was 384. States that is very rare. He doesn't remember his A1C. Have faxed a request to [facility] for most recent result. Instructed pt to check his blood sugar Wednesday AM when he gets up. If blood sugar is 70 or below, treat with 1/2 cup of clear juice (apple or cranberry) and recheck blood sugar 15 minutes after drinking juice.

## 2017-02-16 DIAGNOSIS — N186 End stage renal disease: Secondary | ICD-10-CM | POA: Diagnosis not present

## 2017-02-16 DIAGNOSIS — N2581 Secondary hyperparathyroidism of renal origin: Secondary | ICD-10-CM | POA: Diagnosis not present

## 2017-02-16 DIAGNOSIS — E875 Hyperkalemia: Secondary | ICD-10-CM | POA: Diagnosis not present

## 2017-02-16 DIAGNOSIS — E1122 Type 2 diabetes mellitus with diabetic chronic kidney disease: Secondary | ICD-10-CM | POA: Diagnosis not present

## 2017-02-16 NOTE — Anesthesia Preprocedure Evaluation (Addendum)
Anesthesia Evaluation  Patient identified by MRN, date of birth, ID band Patient awake    Reviewed: Allergy & Precautions, H&P , NPO status , Patient's Chart, lab work & pertinent test results  Airway Mallampati: II  TM Distance: >3 FB Neck ROM: Full    Dental no notable dental hx. (+) Edentulous Upper, Partial Lower, Dental Advisory Given   Pulmonary neg pulmonary ROS,    Pulmonary exam normal breath sounds clear to auscultation       Cardiovascular Exercise Tolerance: Good hypertension, Pt. on medications + dysrhythmias Atrial Fibrillation  Rhythm:Regular Rate:Normal     Neuro/Psych  Headaches, Depression Bipolar Disorder TIACVA, No Residual Symptoms    GI/Hepatic Neg liver ROS, PUD, GERD  Medicated and Controlled,  Endo/Other  diabetes  Renal/GU ESRF and DialysisRenal disease  negative genitourinary   Musculoskeletal   Abdominal   Peds  Hematology negative hematology ROS (+) anemia ,   Anesthesia Other Findings   Reproductive/Obstetrics negative OB ROS                            Anesthesia Physical Anesthesia Plan  ASA: III  Anesthesia Plan: General   Post-op Pain Management:    Induction: Intravenous  PONV Risk Score and Plan: 3 and Ondansetron, Dexamethasone and Midazolam  Airway Management Planned: LMA  Additional Equipment:   Intra-op Plan:   Post-operative Plan: Extubation in OR  Informed Consent: I have reviewed the patients History and Physical, chart, labs and discussed the procedure including the risks, benefits and alternatives for the proposed anesthesia with the patient or authorized representative who has indicated his/her understanding and acceptance.   Dental advisory given  Plan Discussed with: CRNA  Anesthesia Plan Comments:        Anesthesia Quick Evaluation

## 2017-02-17 ENCOUNTER — Ambulatory Visit (HOSPITAL_COMMUNITY)
Admission: RE | Admit: 2017-02-17 | Discharge: 2017-02-17 | Disposition: A | Discharge status: Home / Self Care | Payer: Medicare Other | Source: Ambulatory Visit | Attending: Vascular Surgery | Admitting: Vascular Surgery

## 2017-02-17 ENCOUNTER — Encounter (HOSPITAL_COMMUNITY)
Admission: RE | Disposition: A | Discharge status: Home / Self Care | Payer: Self-pay | Source: Ambulatory Visit | Attending: Vascular Surgery

## 2017-02-17 ENCOUNTER — Encounter (HOSPITAL_COMMUNITY): Payer: Self-pay | Admitting: Urology

## 2017-02-17 ENCOUNTER — Encounter (HOSPITAL_COMMUNITY): Payer: Self-pay | Admitting: Vascular Surgery

## 2017-02-17 DIAGNOSIS — Z89512 Acquired absence of left leg below knee: Secondary | ICD-10-CM | POA: Insufficient documentation

## 2017-02-17 DIAGNOSIS — I480 Paroxysmal atrial fibrillation: Secondary | ICD-10-CM | POA: Insufficient documentation

## 2017-02-17 DIAGNOSIS — I132 Hypertensive heart and chronic kidney disease with heart failure and with stage 5 chronic kidney disease, or end stage renal disease: Secondary | ICD-10-CM | POA: Insufficient documentation

## 2017-02-17 DIAGNOSIS — N400 Enlarged prostate without lower urinary tract symptoms: Secondary | ICD-10-CM | POA: Diagnosis not present

## 2017-02-17 DIAGNOSIS — F319 Bipolar disorder, unspecified: Secondary | ICD-10-CM | POA: Diagnosis not present

## 2017-02-17 DIAGNOSIS — Z79899 Other long term (current) drug therapy: Secondary | ICD-10-CM | POA: Insufficient documentation

## 2017-02-17 DIAGNOSIS — Z87442 Personal history of urinary calculi: Secondary | ICD-10-CM | POA: Diagnosis not present

## 2017-02-17 DIAGNOSIS — Z6830 Body mass index (BMI) 30.0-30.9, adult: Secondary | ICD-10-CM | POA: Diagnosis not present

## 2017-02-17 DIAGNOSIS — Z992 Dependence on renal dialysis: Secondary | ICD-10-CM | POA: Diagnosis not present

## 2017-02-17 DIAGNOSIS — I5042 Chronic combined systolic (congestive) and diastolic (congestive) heart failure: Secondary | ICD-10-CM | POA: Diagnosis not present

## 2017-02-17 DIAGNOSIS — B1920 Unspecified viral hepatitis C without hepatic coma: Secondary | ICD-10-CM | POA: Diagnosis not present

## 2017-02-17 DIAGNOSIS — Z89511 Acquired absence of right leg below knee: Secondary | ICD-10-CM | POA: Diagnosis not present

## 2017-02-17 DIAGNOSIS — N186 End stage renal disease: Secondary | ICD-10-CM | POA: Diagnosis not present

## 2017-02-17 DIAGNOSIS — E1122 Type 2 diabetes mellitus with diabetic chronic kidney disease: Secondary | ICD-10-CM | POA: Diagnosis not present

## 2017-02-17 DIAGNOSIS — N185 Chronic kidney disease, stage 5: Secondary | ICD-10-CM | POA: Diagnosis not present

## 2017-02-17 DIAGNOSIS — K219 Gastro-esophageal reflux disease without esophagitis: Secondary | ICD-10-CM | POA: Diagnosis not present

## 2017-02-17 HISTORY — PX: BASCILIC VEIN TRANSPOSITION: SHX5742

## 2017-02-17 LAB — POCT I-STAT 4, (NA,K, GLUC, HGB,HCT)
Glucose, Bld: 95 mg/dL (ref 65–99)
HCT: 44 % (ref 39.0–52.0)
Hemoglobin: 15 g/dL (ref 13.0–17.0)
Potassium: 5.5 mmol/L — ABNORMAL HIGH (ref 3.5–5.1)
Sodium: 132 mmol/L — ABNORMAL LOW (ref 135–145)

## 2017-02-17 LAB — PROTIME-INR
INR: 1.37
PROTHROMBIN TIME: 16.9 s — AB (ref 11.4–15.2)

## 2017-02-17 SURGERY — Surgical Case
Anesthesia: *Unknown

## 2017-02-17 SURGERY — TRANSPOSITION, VEIN, BASILIC
Anesthesia: General | Site: Arm Upper | Laterality: Left

## 2017-02-17 MED ORDER — SODIUM CHLORIDE 0.9 % IV SOLN
INTRAVENOUS | Status: DC | PRN
  Administered 2017-02-17: 500 mL

## 2017-02-17 MED ORDER — ONDANSETRON HCL 4 MG/2ML IJ SOLN
INTRAMUSCULAR | Status: DC | PRN
  Administered 2017-02-17: 4 mg via INTRAVENOUS

## 2017-02-17 MED ORDER — FENTANYL CITRATE (PF) 100 MCG/2ML IJ SOLN
INTRAMUSCULAR | Status: DC | PRN
  Administered 2017-02-17: 25 ug via INTRAVENOUS
  Administered 2017-02-17: 50 ug via INTRAVENOUS
  Administered 2017-02-17: 25 ug via INTRAVENOUS
  Administered 2017-02-17: 50 ug via INTRAVENOUS

## 2017-02-17 MED ORDER — PROPOFOL 10 MG/ML IV BOLUS
INTRAVENOUS | Status: AC
  Filled 2017-02-17: qty 20

## 2017-02-17 MED ORDER — DEXTROSE 5 % IV SOLN
1.5000 g | INTRAVENOUS | Status: AC
  Administered 2017-02-17: 1.5 g via INTRAVENOUS
  Filled 2017-02-17: qty 1.5

## 2017-02-17 MED ORDER — CHLORHEXIDINE GLUCONATE CLOTH 2 % EX PADS: 6.0000 | MEDICATED_PAD | Freq: Once | CUTANEOUS | Status: DC

## 2017-02-17 MED ORDER — SODIUM CHLORIDE 0.9 % IV SOLN
INTRAVENOUS | Status: DC
  Administered 2017-02-17 (×2): via INTRAVENOUS

## 2017-02-17 MED ORDER — MIDAZOLAM HCL 2 MG/2ML IJ SOLN
INTRAMUSCULAR | Status: AC
  Filled 2017-02-17: qty 2

## 2017-02-17 MED ORDER — FENTANYL CITRATE (PF) 250 MCG/5ML IJ SOLN
INTRAMUSCULAR | Status: AC
  Filled 2017-02-17: qty 5

## 2017-02-17 MED ORDER — 0.9 % SODIUM CHLORIDE (POUR BTL) OPTIME
TOPICAL | Status: DC | PRN
  Administered 2017-02-17: 1000 mL

## 2017-02-17 MED ORDER — PROPOFOL 10 MG/ML IV BOLUS
INTRAVENOUS | Status: DC | PRN
  Administered 2017-02-17: 80 mg via INTRAVENOUS
  Administered 2017-02-17: 40 mg via INTRAVENOUS

## 2017-02-17 MED ORDER — PHENYLEPHRINE HCL 10 MG/ML IJ SOLN
INTRAMUSCULAR | Status: DC | PRN
  Administered 2017-02-17: 25 ug/min via INTRAVENOUS

## 2017-02-17 MED ORDER — STERILE WATER FOR IRRIGATION IR SOLN
Status: DC | PRN
  Administered 2017-02-17: 1000 mL

## 2017-02-17 MED ORDER — OXYCODONE-ACETAMINOPHEN 5-325 MG PO TABS: 1.0000 | ORAL_TABLET | Freq: Four times a day (QID) | ORAL | 0 refills | Status: DC | PRN

## 2017-02-17 MED ORDER — HYDROMORPHONE HCL 1 MG/ML IJ SOLN: 0.2500 mg | INTRAMUSCULAR | Status: DC | PRN

## 2017-02-17 MED ORDER — EPHEDRINE SULFATE 50 MG/ML IJ SOLN
INTRAMUSCULAR | Status: DC | PRN
  Administered 2017-02-17: 5 mg via INTRAVENOUS

## 2017-02-17 SURGICAL SUPPLY — 37 items
ADH SKN CLS APL DERMABOND .7 (GAUZE/BANDAGES/DRESSINGS) ×1
AGENT HMST SPONGE THK3/8 (HEMOSTASIS)
ARMBAND PINK RESTRICT EXTREMIT (MISCELLANEOUS) ×3 IMPLANT
CANISTER SUCT 3000ML PPV (MISCELLANEOUS) ×3 IMPLANT
CLIP TI MEDIUM 24 (CLIP) ×3 IMPLANT
CLIP TI WIDE RED SMALL 24 (CLIP) ×3 IMPLANT
CORDS BIPOLAR (ELECTRODE) IMPLANT
COVER PROBE W GEL 5X96 (DRAPES) IMPLANT
DECANTER SPIKE VIAL GLASS SM (MISCELLANEOUS) ×1 IMPLANT
DERMABOND ADVANCED (GAUZE/BANDAGES/DRESSINGS) ×2
DERMABOND ADVANCED .7 DNX12 (GAUZE/BANDAGES/DRESSINGS) ×1 IMPLANT
ELECT REM PT RETURN 9FT ADLT (ELECTROSURGICAL) ×3
ELECTRODE REM PT RTRN 9FT ADLT (ELECTROSURGICAL) ×1 IMPLANT
GLOVE BIO SURGEON STRL SZ7 (GLOVE) ×3 IMPLANT
GLOVE BIOGEL M 6.5 STRL (GLOVE) ×2 IMPLANT
GLOVE BIOGEL PI IND STRL 6.5 (GLOVE) IMPLANT
GLOVE BIOGEL PI IND STRL 7.5 (GLOVE) ×1 IMPLANT
GLOVE BIOGEL PI INDICATOR 6.5 (GLOVE) ×4
GLOVE BIOGEL PI INDICATOR 7.5 (GLOVE) ×2
GOWN STRL REUS W/ TWL LRG LVL3 (GOWN DISPOSABLE) ×3 IMPLANT
GOWN STRL REUS W/TWL LRG LVL3 (GOWN DISPOSABLE) ×9
HEMOSTAT SPONGE AVITENE ULTRA (HEMOSTASIS) IMPLANT
KIT BASIN OR (CUSTOM PROCEDURE TRAY) ×3 IMPLANT
KIT ROOM TURNOVER OR (KITS) ×3 IMPLANT
NS IRRIG 1000ML POUR BTL (IV SOLUTION) ×3 IMPLANT
PACK CV ACCESS (CUSTOM PROCEDURE TRAY) ×3 IMPLANT
PAD ARMBOARD 7.5X6 YLW CONV (MISCELLANEOUS) ×6 IMPLANT
SUT MNCRL AB 4-0 PS2 18 (SUTURE) ×3 IMPLANT
SUT PROLENE 6 0 BV (SUTURE) ×7 IMPLANT
SUT PROLENE 7 0 BV 1 (SUTURE) ×6 IMPLANT
SUT SILK 2 0 SH (SUTURE) IMPLANT
SUT VIC AB 2-0 CT1 27 (SUTURE)
SUT VIC AB 2-0 CT1 TAPERPNT 27 (SUTURE) ×1 IMPLANT
SUT VIC AB 3-0 SH 27 (SUTURE) ×6
SUT VIC AB 3-0 SH 27X BRD (SUTURE) ×2 IMPLANT
UNDERPAD 30X30 (UNDERPADS AND DIAPERS) ×3 IMPLANT
WATER STERILE IRR 1000ML POUR (IV SOLUTION) ×3 IMPLANT

## 2017-02-17 NOTE — Progress Notes (Signed)
Pt has good bruit and thrill

## 2017-02-17 NOTE — Anesthesia Postprocedure Evaluation (Signed)
Anesthesia Post Note  Patient: Oscar Castillo  Procedure(s) Performed: Procedure(s) (LRB): LEFT 1ST STAGE BRACHIAL VEIN TRANSPOSITION (Left)     Patient location during evaluation: PACU Anesthesia Type: General Level of consciousness: awake and alert Pain management: pain level controlled Vital Signs Assessment: post-procedure vital signs reviewed and stable Respiratory status: spontaneous breathing, nonlabored ventilation and respiratory function stable Cardiovascular status: blood pressure returned to baseline and stable Postop Assessment: no signs of nausea or vomiting Anesthetic complications: no    Last Vitals:  Vitals:   02/17/17 1021 02/17/17 1024  BP: 130/83 128/84  Pulse: 70 70  Resp: 18 19  Temp: (!) 36.1 C     Last Pain:  Vitals:   02/17/17 0614  TempSrc: Oral                 Shandrika Ambers,W. EDMOND

## 2017-02-17 NOTE — Op Note (Signed)
OPERATIVE NOTE   PROCEDURE: 1. left first stage brachial vein transposition (brachiobrachial arteriovenous fistula) placement  PRE-OPERATIVE DIAGNOSIS: end stage renal disease   POST-OPERATIVE DIAGNOSIS: same as above   SURGEON: Adele Barthel, MD  ASSISTANT(S): RNFA  ANESTHESIA: general  ESTIMATED BLOOD LOSS: 50 cc  FINDINGS: 1.  Brachial vein: 4 mm, acceptable quality 2.  Brachial artery: 4 mm, mild atherosclerotic disease 3.  Venous outflow: faintly palpable thrill  4.  Radial flow: dopplerable radial signal  SPECIMEN(S):  none  INDICATIONS:   Oscar Castillo is a 66 y.o. male who presents with end stage renal disease.  He previously underwent bilateral venogram to determine best access option.  The patient is scheduled for left first stage brachial vein transposition.  The patient is aware the risks include but are not limited to: bleeding, infection, steal syndrome, nerve damage, ischemic monomelic neuropathy, failure to mature, and need for additional procedures.  The patient is aware of the risks of the procedure and elects to proceed forward.  DESCRIPTION: After full informed written consent was obtained from the patient, the patient was brought back to the operating room and placed supine upon the operating table.  Prior to induction, the patient received IV antibiotics.   After obtaining adequate anesthesia, the patient was then prepped and draped in the standard fashion for a leftarm access procedure.    I turned my attention first to identifying the patient's brachial vein and brachial artery.  Using SonoSite guidance, the location of these vessels were marked out on the skin.   I made a longitudinal incision at the level of the antecubitum and dissected through the subcutaneous tissue and fascia to gain exposure of the brachial artery.  This was noted to be 4 mm in diameter externally.  This was dissected out proximally and distally and controlled with vessel loops .  I  then dissected out the brachial vein.  This was noted to be 4 mm in diameter externally.  The distal segment of the vein was ligated with a  2-0 silk, and the vein was transected.  The proximal segment was interrogated with serial dilators.  The vein accepted up to a 5 mm dilator without any difficulty.  I then instilled the heparinized saline into the vein and clamped it.  At this point, I reset my exposure of the brachial artery and placed the artery under tension proximally and distally.  I made an arteriotomy with a #11 blade, and then I extended the arteriotomy with a Potts scissor.  The arteriotomy tore unexpectedly and extended another 1-2 mm, so I repaired this with interrupted stitches of 6-0 Prolene to narrow the arteriotomy to 3 mm.  I injected heparinized saline proximal and distal to this arteriotomy.  The vein was then sewn to the artery in an end-to-side configuration with a running stitch of 6-0 Prolene.  Prior to completing this anastomosis, I allowed the vein and artery to backbleed.  There was no evidence of clot from any vessels.  I completed the anastomosis in the usual fashion and then released all vessel loops and clamps.    There was a faintly palpable thrill in the venous outflow, and there was a dopplerable radial signal.   At this point, I irrigated out the surgical wound.  There was no further active bleeding.  The subcutaneous tissue was reapproximated with a running stitch of 3-0 Vicryl.  The skin was then reapproximated with a running subcuticular stitch of 4-0 Vicryl.  The  skin was then cleaned, dried, and reinforced with Liquidband.  The patient tolerated this procedure well.    COMPLICATIONS: noen  CONDITION: stable   Adele Barthel, MD, FACS [facility] Office: [phone] Pager: [phone]  02/17/2017, 8:53 AM

## 2017-02-17 NOTE — H&P (Signed)
Brief History and Physical   History of Present Illness   Oscar Castillo is a 66 y.o. male who presents with chief complaint: end stage renal disease.  The patient presents today for L 1st stage BRVT, possible hybrid AVG.    Past Medical History:  Diagnosis Date  . Anemia   . Antral ulcer 2014   small  . Atrial fibrillation ([place])   . Atrial fibrillation with RVR ([place]) 10/07/2016  . BACK PAIN, LUMBAR, CHRONIC   . BENIGN PROSTATIC HYPERTROPHY   . Bipolar disorder ([place])    "sometimes" (10/07/2016)  . CHOLELITHIASIS   . Chronic combined systolic and diastolic CHF (congestive heart failure) ([place])   . Complication of anesthesia    wife states pt had trouble waking up with in Nov., 2014  . CVA (cerebral vascular accident) ([place]) 07/2007  . DEPRESSION   . DIABETES MELLITUS, TYPE II    diet control  . ERECTILE DYSFUNCTION   . ESRD (end stage renal disease) on dialysis [facility])    "[place]; TTS" (10/07/2016)  . ESRD on hemodialysis ([place])    ESRD due to DM/HTN. Started dialysis in November 2013.  HD TTS at [facility] on [address].  [name] GERD   . GI bleed    due to gastritis, discharged 10/02/16/notes 10/07/2016  . Headache    "monthly" (10/07/2016)  . Hemorrhoids   . Hepatitis C    C - has been treated  . History of blood transfusion    "related to dialysis" (10/07/2016)  . History of Clostridium difficile   . History of kidney stones   . HYPERTENSION   . LBBB (left bundle branch block)   . Morbid obesity ([place])   . PAF (paroxysmal atrial fibrillation) ([name])    a. Dx 12/2015.    Past Surgical History:  Procedure Laterality Date  . AMPUTATION Left 05/12/2013   Procedure: AMPUTATION RAY;  Surgeon: Newt Minion, MD;  Location: [place];  Service: Orthopedics;  Laterality: Left;  Left Foot 1st Ray Amputation  . AMPUTATION Left 06/09/2013   Procedure: AMPUTATION BELOW KNEE;  Surgeon: Newt Minion, MD;  Location: [place];  Service: Orthopedics;  Laterality: Left;  Left Below Knee Amputation  and removal proximal screws IM tibial nail  . AMPUTATION Right 09/08/2013   Procedure: AMPUTATION BELOW KNEE;  Surgeon: Newt Minion, MD;  Location: [place];  Service: Orthopedics;  Laterality: Right;  Right Below Knee Amputation  . AMPUTATION Right 10/11/2013   Procedure: AMPUTATION BELOW KNEE;  Surgeon: Newt Minion, MD;  Location: [place];  Service: Orthopedics;  Laterality: Right;  Right Below Knee Amputation Revision  . AV FISTULA PLACEMENT  06/14/2012   Procedure: ARTERIOVENOUS (AV) FISTULA CREATION;  Surgeon: Angelia Mould, MD;  Location: [facility] OR;  Service: Vascular;  Laterality: Left;  Left basilic vein transposition with fistula.  . COLONOSCOPY N/A 10/28/2012   Procedure: COLONOSCOPY;  Surgeon: Jeryl Columbia, MD;  Location: [facility] ENDOSCOPY;  Service: Endoscopy;  Laterality: N/A;  . COLONOSCOPY N/A 11/02/2012   Procedure: COLONOSCOPY;  Surgeon: Cleotis Nipper, MD;  Location: [facility]COPY;  Service: Endoscopy;  Laterality: N/A;  . COLONOSCOPY N/A 11/03/2012   Procedure: COLONOSCOPY;  Surgeon: Cleotis Nipper, MD;  Location: [facility] ENDOSCOPY;  Service: Endoscopy;  Laterality: N/A;  . COLONOSCOPY N/A 09/16/2016   Procedure: COLONOSCOPY;  Surgeon: Jerene Bears, MD;  [facility] ENDOSCOPY;  Service: Gastroenterology;  Laterality: N/A;  . ENTEROSCOPY N/A 11/08/2012  Procedure: ENTEROSCOPY;  Surgeon: Wonda Horner, MD;  Location: [facility] ENDOSCOPY;  Service: Endoscopy;  Laterality: N/A;  . ESOPHAGOGASTRODUODENOSCOPY N/A 11/02/2012   Procedure: ESOPHAGOGASTRODUODENOSCOPY (EGD);  Surgeon: Cleotis Nipper, MD;  Location: [facility] ENDOSCOPY;  Service: Endoscopy;  Laterality: N/A;  . ESOPHAGOGASTRODUODENOSCOPY (EGD) WITH PROPOFOL N/A 09/16/2016   Procedure: ESOPHAGOGASTRODUODENOSCOPY (EGD) WITH PROPOFOL;  Surgeon: Jerene Bears, MD;  [facility] ENDOSCOPY;  Service: Gastroenterology;  Laterality: N/A;  . EXCHANGE OF A DIALYSIS CATHETER Left 11/13/2016   Procedure: EXCHANGE OF A DIALYSIS CATHETER;  Surgeon: Elam Dutch, MD;  Location: [place];  Service: Vascular;  Laterality: Left;  . EYE SURGERY Left    to remove scar tissue  . FRACTURE SURGERY    . GIVENS CAPSULE STUDY N/A 11/04/2012   Procedure: GIVENS CAPSULE STUDY;  Surgeon: Cleotis Nipper, MD;  Location: [facility] ENDOSCOPY;  Service: Endoscopy;  Laterality: N/A;  . GIVENS CAPSULE STUDY N/A 09/29/2016   Procedure: GIVENS CAPSULE STUDY;  Surgeon: Manus Gunning, MD;  Location: [place];  Service: Gastroenterology;  Laterality: N/A;  try to keep pt up in bedside chair as much as possible during the study.    [name] HARDWARE REMOVAL Left 06/09/2013   Procedure: HARDWARE REMOVAL;  Surgeon: Newt Minion, MD;  Location: [place];  Service: Orthopedics;  Laterality: Left;  Left Below Knee Amputation  and Removal proximal screws IM tibial nail  . INSERTION OF DIALYSIS CATHETER N/A 11/09/2016   Procedure: INSERTION OF TEMPORARY DIALYSIS CATHETER;  Surgeon: Elam Dutch, MD;  Location: [place];  Service: Vascular;  Laterality: N/A;  . IR GENERIC HISTORICAL  09/27/2016   IR US GUIDE VASC ACCESS RIGHT 09/27/2016 Aletta Edouard, MD [facility]  . IR GENERIC HISTORICAL  09/27/2016   IR FLUORO GUIDE CV LINE RIGHT 09/27/2016 Aletta Edouard, MD [facility]  . LIGATION OF ARTERIOVENOUS  FISTULA Left 11/09/2016   Procedure: LIGATION OF ARTERIOVENOUS  FISTULA;  Surgeon: Elam Dutch, MD;  Location: [place];  Service: Vascular;  Laterality: Left;  . NEPHRECTOMY     partial RR  . ORIF FIBULA FRACTURE Left 09/09/2012   Procedure: OPEN REDUCTION INTERNAL FIXATION (ORIF) FIBULA FRACTURE;  Surgeon: Johnny Bridge, MD;  Location: [place];  Service: Orthopedics;  Laterality: Left;  . TIBIA IM NAIL INSERTION Left 09/09/2012   Procedure: INTRAMEDULLARY (IM) NAIL TIBIAL;  Surgeon: Johnny Bridge, MD;  Location: [place];  Service: Orthopedics;  Laterality: Left;  left tibial nail and open reduction internal fixation left fibula fracture  . UPPER EXTREMITY VENOGRAPHY N/A 12/14/2016    Procedure: Bilateral Upper Extremity Venography and Central Venography;  Surgeon: Conrad Congerville, MD;  Location: [place] CV LAB;  Service: Cardiovascular;  Laterality: N/A;    Social History   Social History  . Marital status: Married    Spouse name: N/A  . Number of children: 2  . Years of education: 16   Occupational History  . disabled due to stroke Retired   Social History Main Topics  . Smoking status: Never Smoker  . Smokeless tobacco: Never Used  . Alcohol use No  . Drug use: No  . Sexual activity: Yes    Birth control/ protection: None   Other Topics Concern  . 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  Diabetes Mother   . Hypertension Mother   . Heart attack Father   . Hypertension Father   . Coronary artery disease Other     Current Facility-Administered Medications  Medication Dose Route Frequency Provider Last Rate Last Dose  . 0.9 %  sodium chloride infusion   Intravenous Continuous Conrad [place], MD      . cefUROXime (ZINACEF) 1.5 g in dextrose 5 % 50 mL IVPB  1.5 g Intravenous 30 min Pre-Op Conrad [place], MD      . Chlorhexidine Gluconate Cloth 2 % PADS 6 each  6 each Topical Once Conrad Shaw, MD        Allergies  Allergen Reactions  . No Known Allergies     Review of Systems: As listed above, otherwise negative.   Physical Examination   Vitals:   02/17/17 0614  BP: (!) 165/94  Pulse: 76  Resp: 18  Temp: 98.8 F (37.1 C)  TempSrc: Oral  SpO2: 100%  Weight: 210 lb (95.3 kg)   Body mass index is 30.13 kg/m.  General Alert, O x 3, WD, NAD  Pulmonary Sym exp, good B air movt, CTA B  Cardiac RRR, Nl S1, S2, no Murmurs, No rubs, No S3,S4  Musculo- skeletal DNVI  Neurologic Pain and light touch intact in extremities ,     Laboratory  See iStat   Medical Decision Making   Oscar Castillo is a  66 y.o. male who presents with: ESRD.   The patient is scheduled for: Left 1st stage BRVT, possible hybrid AVG placement  Risk, benefits, and alternatives to access surgery were discussed.  The patient is aware the risks include but are not limited to: bleeding, infection, steal syndrome, nerve damage, ischemic monomelic neuropathy, failure to mature, and need for additional procedures.  The patient is aware of the risks and agrees to proceed.   Adele Barthel, MD, FACS [facility] Office: [phone] Pager: [phone]  02/17/2017, 7:02 AM

## 2017-02-17 NOTE — Anesthesia Procedure Notes (Addendum)
Procedure Name: LMA Insertion Date/Time: 02/17/2017 7:44 AM Performed by: Shirlyn Goltz Pre-anesthesia Checklist: Patient identified, Emergency Drugs available, Suction available and Patient being monitored Patient Re-evaluated:Patient Re-evaluated prior to induction Oxygen Delivery Method: Circle system utilized Preoxygenation: Pre-oxygenation with 100% oxygen Induction Type: IV induction Ventilation: Mask ventilation without difficulty LMA: LMA inserted LMA Size: 5.0 Number of attempts: 1 Placement Confirmation: positive ETCO2 and breath sounds checked- equal and bilateral Tube secured with: Tape Dental Injury: Teeth and Oropharynx as per pre-operative assessment

## 2017-02-17 NOTE — Transfer of Care (Signed)
Immediate Anesthesia Transfer of Care Note  Patient: Oscar Castillo  Procedure(s) Performed: Procedure(s): LEFT 1ST STAGE BRACHIAL VEIN TRANSPOSITION (Left)  Patient Location: PACU  Anesthesia Type:General  Level of Consciousness: drowsy  Airway & Oxygen Therapy: Patient Spontanous Breathing and Patient connected to face mask oxygen  Post-op Assessment: Report given to RN and Post -op Vital signs reviewed and stable  Post vital signs: Reviewed and stable  Last Vitals:  Vitals:   02/17/17 0614  BP: (!) 165/94  Pulse: 76  Resp: 18  Temp: 37.1 C    Last Pain:  Vitals:   02/17/17 0614  TempSrc: Oral      Patients Stated Pain Goal: 5 (16/43/53 9122)  Complications: No apparent anesthesia complications

## 2017-02-18 ENCOUNTER — Telehealth: Payer: Self-pay | Admitting: Vascular Surgery

## 2017-02-18 ENCOUNTER — Encounter (HOSPITAL_COMMUNITY): Payer: Self-pay | Admitting: Vascular Surgery

## 2017-02-18 DIAGNOSIS — N186 End stage renal disease: Secondary | ICD-10-CM | POA: Diagnosis not present

## 2017-02-18 DIAGNOSIS — Z5181 Encounter for therapeutic drug level monitoring: Secondary | ICD-10-CM | POA: Diagnosis not present

## 2017-02-18 DIAGNOSIS — E875 Hyperkalemia: Secondary | ICD-10-CM | POA: Diagnosis not present

## 2017-02-18 DIAGNOSIS — I4891 Unspecified atrial fibrillation: Secondary | ICD-10-CM | POA: Diagnosis not present

## 2017-02-18 DIAGNOSIS — N185 Chronic kidney disease, stage 5: Secondary | ICD-10-CM | POA: Diagnosis not present

## 2017-02-18 DIAGNOSIS — E1122 Type 2 diabetes mellitus with diabetic chronic kidney disease: Secondary | ICD-10-CM | POA: Diagnosis not present

## 2017-02-18 DIAGNOSIS — N2581 Secondary hyperparathyroidism of renal origin: Secondary | ICD-10-CM | POA: Diagnosis not present

## 2017-02-18 NOTE — Telephone Encounter (Signed)
Sched appt 04/09/17 at 1:30. Lm on hm#.

## 2017-02-18 NOTE — Telephone Encounter (Signed)
-----   Message from Mena Goes, RN sent at 02/18/2017 11:09 AM EDT ----- Regarding: 6-8 weeks   ----- Message ----- From: Conrad [place], MD Sent: 02/18/2017  10:32 AM To: [facility] Charge [address]  ORLANDUS BOROWSKI [id number] 06/09/1951  PROCEDURE: 1. left first stage brachial vein transposition (brachiobrachial arteriovenous fistula) placement  Follow-up: 6-8 weeks

## 2017-02-20 DIAGNOSIS — E875 Hyperkalemia: Secondary | ICD-10-CM | POA: Diagnosis not present

## 2017-02-20 DIAGNOSIS — N2581 Secondary hyperparathyroidism of renal origin: Secondary | ICD-10-CM | POA: Diagnosis not present

## 2017-02-20 DIAGNOSIS — E1122 Type 2 diabetes mellitus with diabetic chronic kidney disease: Secondary | ICD-10-CM | POA: Diagnosis not present

## 2017-02-20 DIAGNOSIS — N186 End stage renal disease: Secondary | ICD-10-CM | POA: Diagnosis not present

## 2017-02-23 ENCOUNTER — Ambulatory Visit (INDEPENDENT_AMBULATORY_CARE_PROVIDER_SITE_OTHER): Payer: Medicare Other | Admitting: *Deleted

## 2017-02-23 DIAGNOSIS — G459 Transient cerebral ischemic attack, unspecified: Secondary | ICD-10-CM

## 2017-02-23 DIAGNOSIS — I638 Other cerebral infarction: Secondary | ICD-10-CM

## 2017-02-23 DIAGNOSIS — Z5181 Encounter for therapeutic drug level monitoring: Secondary | ICD-10-CM | POA: Diagnosis not present

## 2017-02-23 DIAGNOSIS — N2581 Secondary hyperparathyroidism of renal origin: Secondary | ICD-10-CM | POA: Diagnosis not present

## 2017-02-23 DIAGNOSIS — I4891 Unspecified atrial fibrillation: Secondary | ICD-10-CM | POA: Diagnosis not present

## 2017-02-23 DIAGNOSIS — Z992 Dependence on renal dialysis: Secondary | ICD-10-CM | POA: Diagnosis not present

## 2017-02-23 DIAGNOSIS — E875 Hyperkalemia: Secondary | ICD-10-CM | POA: Diagnosis not present

## 2017-02-23 DIAGNOSIS — E1122 Type 2 diabetes mellitus with diabetic chronic kidney disease: Secondary | ICD-10-CM | POA: Diagnosis not present

## 2017-02-23 DIAGNOSIS — N186 End stage renal disease: Secondary | ICD-10-CM | POA: Diagnosis not present

## 2017-02-23 LAB — POCT INR: INR: 2.1

## 2017-02-25 DIAGNOSIS — N2581 Secondary hyperparathyroidism of renal origin: Secondary | ICD-10-CM | POA: Diagnosis not present

## 2017-02-25 DIAGNOSIS — E1122 Type 2 diabetes mellitus with diabetic chronic kidney disease: Secondary | ICD-10-CM | POA: Diagnosis not present

## 2017-02-25 DIAGNOSIS — N186 End stage renal disease: Secondary | ICD-10-CM | POA: Diagnosis not present

## 2017-02-25 DIAGNOSIS — D631 Anemia in chronic kidney disease: Secondary | ICD-10-CM | POA: Diagnosis not present

## 2017-02-27 DIAGNOSIS — D631 Anemia in chronic kidney disease: Secondary | ICD-10-CM | POA: Diagnosis not present

## 2017-02-27 DIAGNOSIS — E1122 Type 2 diabetes mellitus with diabetic chronic kidney disease: Secondary | ICD-10-CM | POA: Diagnosis not present

## 2017-02-27 DIAGNOSIS — N186 End stage renal disease: Secondary | ICD-10-CM | POA: Diagnosis not present

## 2017-02-27 DIAGNOSIS — N2581 Secondary hyperparathyroidism of renal origin: Secondary | ICD-10-CM | POA: Diagnosis not present

## 2017-02-28 ENCOUNTER — Other Ambulatory Visit (INDEPENDENT_AMBULATORY_CARE_PROVIDER_SITE_OTHER): Payer: Self-pay | Admitting: Orthopedic Surgery

## 2017-03-01 ENCOUNTER — Ambulatory Visit (INDEPENDENT_AMBULATORY_CARE_PROVIDER_SITE_OTHER): Payer: Medicare Other | Admitting: Orthopedic Surgery

## 2017-03-01 DIAGNOSIS — Z89512 Acquired absence of left leg below knee: Secondary | ICD-10-CM

## 2017-03-01 DIAGNOSIS — E11610 Type 2 diabetes mellitus with diabetic neuropathic arthropathy: Secondary | ICD-10-CM

## 2017-03-01 DIAGNOSIS — Z89511 Acquired absence of right leg below knee: Secondary | ICD-10-CM | POA: Diagnosis not present

## 2017-03-01 DIAGNOSIS — I638 Other cerebral infarction: Secondary | ICD-10-CM

## 2017-03-01 MED ORDER — SILVER SULFADIAZINE 1 % EX CREA: 1.0000 "application " | TOPICAL_CREAM | Freq: Every day | CUTANEOUS | Status: DC | PRN

## 2017-03-01 NOTE — Progress Notes (Signed)
Office Visit Note   Patient: Oscar Castillo           Date of Birth: [birth year]           MRN: [id number] Visit Date: 03/01/2017              Requested by: [name], MD [place], [place] [address] PCP: [name], MD  No chief complaint on file.     HPI: The patient is a 66 year old gentleman today for prosthetic evaluation. He status post right below the knee amputation 10/01/2013 as well as left below the knee amputation 06/09/2013. 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 He has lost weight his wife reports due to illness and has had significant volume loss bilaterally. Unfortunately he also has continued to lose extension of his knees has worsening contractures.  Does have the we'll power to be successful and is interested in re-enrolling in physical therapy work on his gait. He is well aware of his limitations. Would like to be able to get around his home and participate in his own ADLs independently.  Assessment & Plan: Visit Diagnoses:  1. Diabetic neurogenic arthropathy ([place])   2. S/P BKA (below knee amputation) bilateral (H[facility])     Plan: Have provided an order for new socket for bilateral below the knee amputations. He will do well as single speed K2 level ambulation. Follow-up in office as needed.  Follow-Up Instructions: Return if symptoms worsen or fail to improve.   Ortho Exam  Patient is alert, oriented, no adenopathy, well-dressed, normal affect, normal respiratory effort. Bilateral below the knee amputations are well healed well consolidated there are no open areas or ulceration or callus.  Imaging: No results found.  Labs: Lab Results  Component Value Date   HGBA1C 7.1 (H) 02/10/2017   HGBA1C 6.4 (H) 09/15/2016   HGBA1C 6.2 12/11/2015   ESRSEDRATE 27 (H) 05/09/2013   CRP 6.7 (H) 05/09/2013   REPTSTATUS 11/16/2016 FINAL 11/11/2016   GRAMSTAIN  11/09/2016    FEW WBC PRESENT,BOTH PMN AND MONONUCLEAR NO ORGANISMS SEEN    CULT NO GROWTH 5 DAYS 11/11/2016   LABORGA STAPHYLOCOCCUS AUREUS 11/09/2016    Orders:  No orders of the defined types were placed in this encounter.  No orders of the defined types were placed in this encounter.    Procedures: No procedures performed  Clinical Data: No additional findings.  ROS:  All other systems negative, except as noted in the HPI. Review of Systems  Constitutional: Negative for chills and fever.  Skin: Negative for wound.    Objective: Vital Signs: There were no vitals taken for this visit.  Specialty Comments:  No specialty comments available.  PMFS History: Patient Active Problem List   Diagnosis Date Noted  . General weakness 02/10/2017  . Gait disorder 02/10/2017  . MSSA bacteremia   . Sepsis ([place]) 11/08/2016  . Encounter for therapeutic drug monitoring 10/07/2016  . Diabetes mellitus with complication ([place])   . Anemia due to stage 5 chronic kidney disease ([place])   . Melena 09/26/2016  . Pressure injury of skin 09/26/2016  . Benign neoplasm of descending colon   . Gastroesophageal reflux disease with esophagitis   . Duodenal ulcer without hemorrhage or perforation   . Blood loss anemia 09/15/2016  . Paroxysmal atrial fibrillation ([place]) 09/15/2016  . Heme positive stool 09/15/2016  . RUQ pain 07/12/2016  . Hyperkalemia 07/01/2016  . Anemia 12/31/2015  . Glaucoma 12/27/2015  .  LBBB (left bundle branch block)   . Rash 11/15/2015  . Cramping of hands 11/15/2015  . Liver fibrosis 10/07/2015  . Diarrhea 08/14/2014  . Dehydration 10/02/2013  . Fever 10/02/2013  . Altered mental status 10/01/2013  . Encephalopathy, toxic 10/01/2013  . Encephalopathy, metabolic 56/21/3086  . History of stroke 09/28/2013  . FTT (failure to thrive) in adult 09/28/2013  . Acute confusional state 09/28/2013  . Ulcer of sacral region, stage 3 ([place]) 09/26/2013  . S/P BKA (below knee amputation) bilateral ([place]) 09/08/2013  . ESRD on hemodialysis ([place])  05/09/2013  . TIA (transient ischemic attack) 02/20/2013  . Acute blood loss anemia 10/28/2012  . Chronic combined systolic and diastolic congestive heart failure 10/28/2012  . Constipation 10/28/2012  . GI bleed 10/27/2012  . Mass in rectum 10/27/2012  . Type 2 diabetes mellitus with chronic kidney disease on chronic dialysis, with long-term current use of insulin ([place]) 09/08/2012  . ESRD (end stage renal disease) ([place]) 09/08/2012  . LVH (left ventricular hypertrophy)-severe concentric 06/13/2012  . Hyperlipidemia 01/30/2011  . CHOLELITHIASIS 08/01/2010  . BENIGN PROSTATIC HYPERTROPHY 08/01/2010  . CEREBROVASCULAR ACCIDENT, HX OF 08/06/2009  . Depression 03/18/2009  . NEPHROLITHIASIS, HX OF 03/18/2009  . Morbid obesity ([place]) 03/25/2007  . Hypertensive heart disease with CHF (congestive heart failure) ([place]) 03/25/2007  . GERD 03/25/2007  . Chronic hepatitis C without hepatic coma ([place]) 03/25/2007   Past Medical History:  Diagnosis Date  . Anemia   . Antral ulcer 2014   small  . Atrial fibrillation ([place])   . Atrial fibrillation with RVR ([place]) 10/07/2016  . BACK PAIN, LUMBAR, CHRONIC   . BENIGN PROSTATIC HYPERTROPHY   . Bipolar disorder ([place])    "sometimes" (10/07/2016)  . CHOLELITHIASIS   . Chronic combined systolic and diastolic CHF (congestive heart failure) ([place])   . Complication of anesthesia    wife states pt had trouble waking up with in Nov., 2014  . CVA (cerebral vascular accident) ([place]) 07/2007  . DEPRESSION   . DIABETES MELLITUS, TYPE II    diet control  . ERECTILE DYSFUNCTION   . ESRD (end stage renal disease) on dialysis [facility])    "[place]; TTS" (10/07/2016)  . ESRD on hemodialysis ([place])    ESRD due to DM/HTN. Started dialysis in November 2013.  HD TTS at [facility] on [address].  [name] GERD   . GI bleed    due to gastritis, discharged 10/02/16/notes 10/07/2016  . Headache    "monthly" (10/07/2016)  . Hemorrhoids   . Hepatitis C    C - has been treated  . History  of blood transfusion    "related to dialysis" (10/07/2016)  . History of Clostridium difficile   . History of kidney stones   . HYPERTENSION   . LBBB (left bundle branch block)   . Morbid obesity ([place])   . PAF (paroxysmal atrial fibrillation) ([place])    a. Dx 12/2015.    Family History  Problem Relation Age of Onset  . Diabetes Mother   . Hypertension Mother   . Heart attack Father   . Hypertension Father   . Coronary artery disease Other     Past Surgical History:  Procedure Laterality Date  . AMPUTATION Left 05/12/2013   Procedure: AMPUTATION RAY;  Surgeon: Newt Minion, MD;  Location: [place];  Service: Orthopedics;  Laterality: Left;  Left Foot 1st Ray Amputation  . AMPUTATION Left 06/09/2013   Procedure: AMPUTATION BELOW KNEE;  Surgeon: Illene Regulus  Sharol Given, MD;  Location: [place];  Service: Orthopedics;  Laterality: Left;  Left Below Knee Amputation and removal proximal screws IM tibial nail  . AMPUTATION Right 09/08/2013   Procedure: AMPUTATION BELOW KNEE;  Surgeon: Newt Minion, MD;  Location: [place];  Service: Orthopedics;  Laterality: Right;  Right Below Knee Amputation  . AMPUTATION Right 10/11/2013   Procedure: AMPUTATION BELOW KNEE;  Surgeon: Newt Minion, MD;  Location: [place];  Service: Orthopedics;  Laterality: Right;  Right Below Knee Amputation Revision  . AV FISTULA PLACEMENT  06/14/2012   Procedure: ARTERIOVENOUS (AV) FISTULA CREATION;  Surgeon: Angelia Mould, MD;  Location: [facility] OR;  Service: Vascular;  Laterality: Left;  Left basilic vein transposition with fistula.  [name] BASCILIC VEIN TRANSPOSITION Left 02/17/2017   Procedure: LEFT 1ST STAGE BRACHIAL VEIN TRANSPOSITION;  Surgeon: Conrad [place], MD;  Location: [place];  Service: Vascular;  Laterality: Left;  . COLONOSCOPY N/A 10/28/2012   Procedure: COLONOSCOPY;  Surgeon: Jeryl Columbia, MD;  Location: [facility] ENDOSCOPY;  Service: Endoscopy;  Laterality: N/A;  . COLONOSCOPY N/A 11/02/2012   Procedure: COLONOSCOPY;  Surgeon: Cleotis Nipper, MD;  Location: [facility] ENDOSCOPY;  Service: Endoscopy;  Laterality: N/A;  . COLONOSCOPY N/A 11/03/2012   Procedure: COLONOSCOPY;  Surgeon: Cleotis Nipper, MD;  Location: [facility] ENDOSCOPY;  Service: Endoscopy;  Laterality: N/A;  . COLONOSCOPY N/A 09/16/2016   Procedure: COLONOSCOPY;  Surgeon: Jerene Bears, MD;  [facility] ENDOSCOPY;  Service: Gastroenterology;  Laterality: N/A;  . ENTEROSCOPY N/A 11/08/2012   Procedure: ENTEROSCOPY;  Surgeon: Wonda Horner, MD;  Location: [facility] ENDOSCOPY;  Service: Endoscopy;  Laterality: N/A;  . ESOPHAGOGASTRODUODENOSCOPY N/A 11/02/2012   Procedure: ESOPHAGOGASTRODUODENOSCOPY (EGD);  Surgeon: Cleotis Nipper, MD;  Location: [facility] ENDOSCOPY;  Service: Endoscopy;  Laterality: N/A;  . ESOPHAGOGASTRODUODENOSCOPY (EGD) WITH PROPOFOL N/A 09/16/2016   Procedure: ESOPHAGOGASTRODUODENOSCOPY (EGD) WITH PROPOFOL;  Surgeon: Jerene Bears, MD;  [facility] ENDOSCOPY;  Service: Gastroenterology;  Laterality: N/A;  . EXCHANGE OF A DIALYSIS CATHETER Left 11/13/2016   Procedure: EXCHANGE OF A DIALYSIS CATHETER;  Surgeon: Elam Dutch, MD;  Location: [place];  Service: Vascular;  Laterality: Left;  . EYE SURGERY Left    to remove scar tissue  . FRACTURE SURGERY    . GIVENS CAPSULE STUDY N/A 11/04/2012   Procedure: GIVENS CAPSULE STUDY;  Surgeon: Cleotis Nipper, MD;  Location: [facility] ENDOSCOPY;  Service: Endoscopy;  Laterality: N/A;  . GIVENS CAPSULE STUDY N/A 09/29/2016   Procedure: GIVENS CAPSULE STUDY;  Surgeon: Manus Gunning, MD;  Location: [place];  Service: Gastroenterology;  Laterality: N/A;  try to keep pt up in bedside chair as much as possible during the study.    [name] HARDWARE REMOVAL Left 06/09/2013   Procedure: HARDWARE REMOVAL;  Surgeon: Newt Minion, MD;  Location: [place];  Service: Orthopedics;  Laterality: Left;  Left Below Knee Amputation  and Removal proximal screws IM tibial nail  . INSERTION OF DIALYSIS CATHETER N/A 11/09/2016   Procedure: INSERTION OF  TEMPORARY DIALYSIS CATHETER;  Surgeon: Elam Dutch, MD;  Location: [place];  Service: Vascular;  Laterality: N/A;  . IR GENERIC HISTORICAL  09/27/2016   IR US GUIDE VASC ACCESS RIGHT 09/27/2016 Aletta Edouard, MD [facility]  . IR GENERIC HISTORICAL  09/27/2016   IR FLUORO GUIDE CV LINE RIGHT 09/27/2016 Aletta Edouard, MD [facility]  . LIGATION OF ARTERIOVENOUS  FISTULA Left 11/09/2016   Procedure: LIGATION OF ARTERIOVENOUS  FISTULA;  Surgeon: Elam Dutch, MD;  Location: [place];  Service: Vascular;  Laterality: Left;  . NEPHRECTOMY     partial RR  . ORIF FIBULA FRACTURE Left 09/09/2012   Procedure: OPEN REDUCTION INTERNAL FIXATION (ORIF) FIBULA FRACTURE;  Surgeon: Johnny Bridge, MD;  Location: [place];  Service: Orthopedics;  Laterality: Left;  . TIBIA IM NAIL INSERTION Left 09/09/2012   Procedure: INTRAMEDULLARY (IM) NAIL TIBIAL;  Surgeon: Johnny Bridge, MD;  Location: [place];  Service: Orthopedics;  Laterality: Left;  left tibial nail and open reduction internal fixation left fibula fracture  . UPPER EXTREMITY VENOGRAPHY N/A 12/14/2016   Procedure: Bilateral Upper Extremity Venography and Central Venography;  Surgeon: Conrad [place], MD;  Location: [place] CV LAB;  Service: Cardiovascular;  Laterality: N/A;   Social History   Occupational History  . disabled due to stroke Retired   Social History Main Topics  . Smoking status: Never Smoker  . Smokeless tobacco: Never Used  . Alcohol use No  . Drug use: No  . Sexual activity: Yes    Birth control/ protection: None

## 2017-03-02 DIAGNOSIS — N2581 Secondary hyperparathyroidism of renal origin: Secondary | ICD-10-CM | POA: Diagnosis not present

## 2017-03-02 DIAGNOSIS — E1122 Type 2 diabetes mellitus with diabetic chronic kidney disease: Secondary | ICD-10-CM | POA: Diagnosis not present

## 2017-03-02 DIAGNOSIS — N186 End stage renal disease: Secondary | ICD-10-CM | POA: Diagnosis not present

## 2017-03-02 DIAGNOSIS — D631 Anemia in chronic kidney disease: Secondary | ICD-10-CM | POA: Diagnosis not present

## 2017-03-04 DIAGNOSIS — N2581 Secondary hyperparathyroidism of renal origin: Secondary | ICD-10-CM | POA: Diagnosis not present

## 2017-03-04 DIAGNOSIS — N186 End stage renal disease: Secondary | ICD-10-CM | POA: Diagnosis not present

## 2017-03-04 DIAGNOSIS — D631 Anemia in chronic kidney disease: Secondary | ICD-10-CM | POA: Diagnosis not present

## 2017-03-04 DIAGNOSIS — E1122 Type 2 diabetes mellitus with diabetic chronic kidney disease: Secondary | ICD-10-CM | POA: Diagnosis not present

## 2017-03-06 DIAGNOSIS — N186 End stage renal disease: Secondary | ICD-10-CM | POA: Diagnosis not present

## 2017-03-06 DIAGNOSIS — N2581 Secondary hyperparathyroidism of renal origin: Secondary | ICD-10-CM | POA: Diagnosis not present

## 2017-03-06 DIAGNOSIS — E1122 Type 2 diabetes mellitus with diabetic chronic kidney disease: Secondary | ICD-10-CM | POA: Diagnosis not present

## 2017-03-06 DIAGNOSIS — D631 Anemia in chronic kidney disease: Secondary | ICD-10-CM | POA: Diagnosis not present

## 2017-03-09 ENCOUNTER — Ambulatory Visit (INDEPENDENT_AMBULATORY_CARE_PROVIDER_SITE_OTHER): Payer: Medicare Other | Admitting: Pharmacist

## 2017-03-09 DIAGNOSIS — E1122 Type 2 diabetes mellitus with diabetic chronic kidney disease: Secondary | ICD-10-CM | POA: Diagnosis not present

## 2017-03-09 DIAGNOSIS — G459 Transient cerebral ischemic attack, unspecified: Secondary | ICD-10-CM | POA: Diagnosis not present

## 2017-03-09 DIAGNOSIS — I638 Other cerebral infarction: Secondary | ICD-10-CM

## 2017-03-09 DIAGNOSIS — Z5181 Encounter for therapeutic drug level monitoring: Secondary | ICD-10-CM | POA: Diagnosis not present

## 2017-03-09 DIAGNOSIS — N186 End stage renal disease: Secondary | ICD-10-CM | POA: Diagnosis not present

## 2017-03-09 DIAGNOSIS — I4891 Unspecified atrial fibrillation: Secondary | ICD-10-CM | POA: Diagnosis not present

## 2017-03-09 DIAGNOSIS — N2581 Secondary hyperparathyroidism of renal origin: Secondary | ICD-10-CM | POA: Diagnosis not present

## 2017-03-09 DIAGNOSIS — D631 Anemia in chronic kidney disease: Secondary | ICD-10-CM | POA: Diagnosis not present

## 2017-03-09 LAB — POCT INR: INR: 2.5

## 2017-03-11 DIAGNOSIS — D631 Anemia in chronic kidney disease: Secondary | ICD-10-CM | POA: Diagnosis not present

## 2017-03-11 DIAGNOSIS — N186 End stage renal disease: Secondary | ICD-10-CM | POA: Diagnosis not present

## 2017-03-11 DIAGNOSIS — N2581 Secondary hyperparathyroidism of renal origin: Secondary | ICD-10-CM | POA: Diagnosis not present

## 2017-03-11 DIAGNOSIS — E1122 Type 2 diabetes mellitus with diabetic chronic kidney disease: Secondary | ICD-10-CM | POA: Diagnosis not present

## 2017-03-13 DIAGNOSIS — D631 Anemia in chronic kidney disease: Secondary | ICD-10-CM | POA: Diagnosis not present

## 2017-03-13 DIAGNOSIS — N2581 Secondary hyperparathyroidism of renal origin: Secondary | ICD-10-CM | POA: Diagnosis not present

## 2017-03-13 DIAGNOSIS — E1122 Type 2 diabetes mellitus with diabetic chronic kidney disease: Secondary | ICD-10-CM | POA: Diagnosis not present

## 2017-03-13 DIAGNOSIS — N186 End stage renal disease: Secondary | ICD-10-CM | POA: Diagnosis not present

## 2017-03-15 ENCOUNTER — Telehealth (INDEPENDENT_AMBULATORY_CARE_PROVIDER_SITE_OTHER): Payer: Self-pay | Admitting: Orthopedic Surgery

## 2017-03-15 NOTE — Telephone Encounter (Signed)
03/01/2017 OV NOTE FAXED TO [facility] [phone] IN SUPPORT OF CMN OF SEVERAL DME'S RELATING TO BIL BKA

## 2017-03-16 DIAGNOSIS — D631 Anemia in chronic kidney disease: Secondary | ICD-10-CM | POA: Diagnosis not present

## 2017-03-16 DIAGNOSIS — N2581 Secondary hyperparathyroidism of renal origin: Secondary | ICD-10-CM | POA: Diagnosis not present

## 2017-03-16 DIAGNOSIS — E1122 Type 2 diabetes mellitus with diabetic chronic kidney disease: Secondary | ICD-10-CM | POA: Diagnosis not present

## 2017-03-16 DIAGNOSIS — N186 End stage renal disease: Secondary | ICD-10-CM | POA: Diagnosis not present

## 2017-03-18 DIAGNOSIS — N186 End stage renal disease: Secondary | ICD-10-CM | POA: Diagnosis not present

## 2017-03-18 DIAGNOSIS — E1122 Type 2 diabetes mellitus with diabetic chronic kidney disease: Secondary | ICD-10-CM | POA: Diagnosis not present

## 2017-03-18 DIAGNOSIS — N2581 Secondary hyperparathyroidism of renal origin: Secondary | ICD-10-CM | POA: Diagnosis not present

## 2017-03-18 DIAGNOSIS — I4891 Unspecified atrial fibrillation: Secondary | ICD-10-CM | POA: Diagnosis not present

## 2017-03-18 DIAGNOSIS — Z5181 Encounter for therapeutic drug level monitoring: Secondary | ICD-10-CM | POA: Diagnosis not present

## 2017-03-18 DIAGNOSIS — D631 Anemia in chronic kidney disease: Secondary | ICD-10-CM | POA: Diagnosis not present

## 2017-03-20 DIAGNOSIS — E1122 Type 2 diabetes mellitus with diabetic chronic kidney disease: Secondary | ICD-10-CM | POA: Diagnosis not present

## 2017-03-20 DIAGNOSIS — D631 Anemia in chronic kidney disease: Secondary | ICD-10-CM | POA: Diagnosis not present

## 2017-03-20 DIAGNOSIS — N2581 Secondary hyperparathyroidism of renal origin: Secondary | ICD-10-CM | POA: Diagnosis not present

## 2017-03-20 DIAGNOSIS — N186 End stage renal disease: Secondary | ICD-10-CM | POA: Diagnosis not present

## 2017-03-22 DIAGNOSIS — I4891 Unspecified atrial fibrillation: Secondary | ICD-10-CM | POA: Diagnosis not present

## 2017-03-22 DIAGNOSIS — E1122 Type 2 diabetes mellitus with diabetic chronic kidney disease: Secondary | ICD-10-CM | POA: Diagnosis not present

## 2017-03-22 DIAGNOSIS — I5042 Chronic combined systolic (congestive) and diastolic (congestive) heart failure: Secondary | ICD-10-CM | POA: Diagnosis not present

## 2017-03-22 DIAGNOSIS — Z7901 Long term (current) use of anticoagulants: Secondary | ICD-10-CM | POA: Diagnosis not present

## 2017-03-22 DIAGNOSIS — I132 Hypertensive heart and chronic kidney disease with heart failure and with stage 5 chronic kidney disease, or end stage renal disease: Secondary | ICD-10-CM | POA: Diagnosis not present

## 2017-03-22 DIAGNOSIS — N400 Enlarged prostate without lower urinary tract symptoms: Secondary | ICD-10-CM | POA: Diagnosis not present

## 2017-03-22 DIAGNOSIS — B1920 Unspecified viral hepatitis C without hepatic coma: Secondary | ICD-10-CM | POA: Diagnosis not present

## 2017-03-22 DIAGNOSIS — Z992 Dependence on renal dialysis: Secondary | ICD-10-CM | POA: Diagnosis not present

## 2017-03-22 DIAGNOSIS — R2689 Other abnormalities of gait and mobility: Secondary | ICD-10-CM | POA: Diagnosis not present

## 2017-03-22 DIAGNOSIS — F329 Major depressive disorder, single episode, unspecified: Secondary | ICD-10-CM | POA: Diagnosis not present

## 2017-03-22 DIAGNOSIS — K219 Gastro-esophageal reflux disease without esophagitis: Secondary | ICD-10-CM | POA: Diagnosis not present

## 2017-03-22 DIAGNOSIS — Z89511 Acquired absence of right leg below knee: Secondary | ICD-10-CM | POA: Diagnosis not present

## 2017-03-22 DIAGNOSIS — N186 End stage renal disease: Secondary | ICD-10-CM | POA: Diagnosis not present

## 2017-03-22 DIAGNOSIS — D631 Anemia in chronic kidney disease: Secondary | ICD-10-CM | POA: Diagnosis not present

## 2017-03-22 DIAGNOSIS — Z89512 Acquired absence of left leg below knee: Secondary | ICD-10-CM | POA: Diagnosis not present

## 2017-03-23 DIAGNOSIS — D631 Anemia in chronic kidney disease: Secondary | ICD-10-CM | POA: Diagnosis not present

## 2017-03-23 DIAGNOSIS — N2581 Secondary hyperparathyroidism of renal origin: Secondary | ICD-10-CM | POA: Diagnosis not present

## 2017-03-23 DIAGNOSIS — E1122 Type 2 diabetes mellitus with diabetic chronic kidney disease: Secondary | ICD-10-CM | POA: Diagnosis not present

## 2017-03-23 DIAGNOSIS — N186 End stage renal disease: Secondary | ICD-10-CM | POA: Diagnosis not present

## 2017-03-24 DIAGNOSIS — D631 Anemia in chronic kidney disease: Secondary | ICD-10-CM | POA: Diagnosis not present

## 2017-03-24 DIAGNOSIS — E1122 Type 2 diabetes mellitus with diabetic chronic kidney disease: Secondary | ICD-10-CM | POA: Diagnosis not present

## 2017-03-24 DIAGNOSIS — I5042 Chronic combined systolic (congestive) and diastolic (congestive) heart failure: Secondary | ICD-10-CM | POA: Diagnosis not present

## 2017-03-24 DIAGNOSIS — N186 End stage renal disease: Secondary | ICD-10-CM | POA: Diagnosis not present

## 2017-03-24 DIAGNOSIS — R2689 Other abnormalities of gait and mobility: Secondary | ICD-10-CM | POA: Diagnosis not present

## 2017-03-24 DIAGNOSIS — I132 Hypertensive heart and chronic kidney disease with heart failure and with stage 5 chronic kidney disease, or end stage renal disease: Secondary | ICD-10-CM | POA: Diagnosis not present

## 2017-03-25 DIAGNOSIS — N186 End stage renal disease: Secondary | ICD-10-CM | POA: Diagnosis not present

## 2017-03-25 DIAGNOSIS — D631 Anemia in chronic kidney disease: Secondary | ICD-10-CM | POA: Diagnosis not present

## 2017-03-25 DIAGNOSIS — E1122 Type 2 diabetes mellitus with diabetic chronic kidney disease: Secondary | ICD-10-CM | POA: Diagnosis not present

## 2017-03-25 DIAGNOSIS — N2581 Secondary hyperparathyroidism of renal origin: Secondary | ICD-10-CM | POA: Diagnosis not present

## 2017-03-26 DIAGNOSIS — N186 End stage renal disease: Secondary | ICD-10-CM | POA: Diagnosis not present

## 2017-03-26 DIAGNOSIS — I132 Hypertensive heart and chronic kidney disease with heart failure and with stage 5 chronic kidney disease, or end stage renal disease: Secondary | ICD-10-CM | POA: Diagnosis not present

## 2017-03-26 DIAGNOSIS — E1122 Type 2 diabetes mellitus with diabetic chronic kidney disease: Secondary | ICD-10-CM | POA: Diagnosis not present

## 2017-03-26 DIAGNOSIS — D631 Anemia in chronic kidney disease: Secondary | ICD-10-CM | POA: Diagnosis not present

## 2017-03-26 DIAGNOSIS — Z992 Dependence on renal dialysis: Secondary | ICD-10-CM | POA: Diagnosis not present

## 2017-03-26 DIAGNOSIS — I5042 Chronic combined systolic (congestive) and diastolic (congestive) heart failure: Secondary | ICD-10-CM | POA: Diagnosis not present

## 2017-03-26 DIAGNOSIS — R2689 Other abnormalities of gait and mobility: Secondary | ICD-10-CM | POA: Diagnosis not present

## 2017-03-27 DIAGNOSIS — D689 Coagulation defect, unspecified: Secondary | ICD-10-CM | POA: Diagnosis not present

## 2017-03-27 DIAGNOSIS — N186 End stage renal disease: Secondary | ICD-10-CM | POA: Diagnosis not present

## 2017-03-27 DIAGNOSIS — D631 Anemia in chronic kidney disease: Secondary | ICD-10-CM | POA: Diagnosis not present

## 2017-03-27 DIAGNOSIS — N2581 Secondary hyperparathyroidism of renal origin: Secondary | ICD-10-CM | POA: Diagnosis not present

## 2017-03-27 DIAGNOSIS — Z23 Encounter for immunization: Secondary | ICD-10-CM | POA: Diagnosis not present

## 2017-03-29 DIAGNOSIS — E1122 Type 2 diabetes mellitus with diabetic chronic kidney disease: Secondary | ICD-10-CM | POA: Diagnosis not present

## 2017-03-29 DIAGNOSIS — R2689 Other abnormalities of gait and mobility: Secondary | ICD-10-CM | POA: Diagnosis not present

## 2017-03-29 DIAGNOSIS — D631 Anemia in chronic kidney disease: Secondary | ICD-10-CM | POA: Diagnosis not present

## 2017-03-29 DIAGNOSIS — N186 End stage renal disease: Secondary | ICD-10-CM | POA: Diagnosis not present

## 2017-03-29 DIAGNOSIS — I132 Hypertensive heart and chronic kidney disease with heart failure and with stage 5 chronic kidney disease, or end stage renal disease: Secondary | ICD-10-CM | POA: Diagnosis not present

## 2017-03-29 DIAGNOSIS — I5042 Chronic combined systolic (congestive) and diastolic (congestive) heart failure: Secondary | ICD-10-CM | POA: Diagnosis not present

## 2017-03-30 ENCOUNTER — Ambulatory Visit (INDEPENDENT_AMBULATORY_CARE_PROVIDER_SITE_OTHER): Payer: Medicare Other | Admitting: *Deleted

## 2017-03-30 ENCOUNTER — Encounter (INDEPENDENT_AMBULATORY_CARE_PROVIDER_SITE_OTHER): Payer: Self-pay

## 2017-03-30 DIAGNOSIS — N2581 Secondary hyperparathyroidism of renal origin: Secondary | ICD-10-CM | POA: Diagnosis not present

## 2017-03-30 DIAGNOSIS — I4891 Unspecified atrial fibrillation: Secondary | ICD-10-CM

## 2017-03-30 DIAGNOSIS — G459 Transient cerebral ischemic attack, unspecified: Secondary | ICD-10-CM | POA: Diagnosis not present

## 2017-03-30 DIAGNOSIS — Z5181 Encounter for therapeutic drug level monitoring: Secondary | ICD-10-CM | POA: Diagnosis not present

## 2017-03-30 DIAGNOSIS — N186 End stage renal disease: Secondary | ICD-10-CM | POA: Diagnosis not present

## 2017-03-30 DIAGNOSIS — D689 Coagulation defect, unspecified: Secondary | ICD-10-CM | POA: Diagnosis not present

## 2017-03-30 DIAGNOSIS — Z23 Encounter for immunization: Secondary | ICD-10-CM | POA: Diagnosis not present

## 2017-03-30 DIAGNOSIS — D631 Anemia in chronic kidney disease: Secondary | ICD-10-CM | POA: Diagnosis not present

## 2017-03-30 LAB — POCT INR: INR: 1.8

## 2017-03-31 DIAGNOSIS — N186 End stage renal disease: Secondary | ICD-10-CM | POA: Diagnosis not present

## 2017-03-31 DIAGNOSIS — R2689 Other abnormalities of gait and mobility: Secondary | ICD-10-CM | POA: Diagnosis not present

## 2017-03-31 DIAGNOSIS — E1122 Type 2 diabetes mellitus with diabetic chronic kidney disease: Secondary | ICD-10-CM | POA: Diagnosis not present

## 2017-03-31 DIAGNOSIS — I132 Hypertensive heart and chronic kidney disease with heart failure and with stage 5 chronic kidney disease, or end stage renal disease: Secondary | ICD-10-CM | POA: Diagnosis not present

## 2017-03-31 DIAGNOSIS — I5042 Chronic combined systolic (congestive) and diastolic (congestive) heart failure: Secondary | ICD-10-CM | POA: Diagnosis not present

## 2017-03-31 DIAGNOSIS — D631 Anemia in chronic kidney disease: Secondary | ICD-10-CM | POA: Diagnosis not present

## 2017-04-01 DIAGNOSIS — Z23 Encounter for immunization: Secondary | ICD-10-CM | POA: Diagnosis not present

## 2017-04-01 DIAGNOSIS — D689 Coagulation defect, unspecified: Secondary | ICD-10-CM | POA: Diagnosis not present

## 2017-04-01 DIAGNOSIS — D631 Anemia in chronic kidney disease: Secondary | ICD-10-CM | POA: Diagnosis not present

## 2017-04-01 DIAGNOSIS — N2581 Secondary hyperparathyroidism of renal origin: Secondary | ICD-10-CM | POA: Diagnosis not present

## 2017-04-01 DIAGNOSIS — N186 End stage renal disease: Secondary | ICD-10-CM | POA: Diagnosis not present

## 2017-04-03 DIAGNOSIS — D631 Anemia in chronic kidney disease: Secondary | ICD-10-CM | POA: Diagnosis not present

## 2017-04-03 DIAGNOSIS — D689 Coagulation defect, unspecified: Secondary | ICD-10-CM | POA: Diagnosis not present

## 2017-04-03 DIAGNOSIS — Z23 Encounter for immunization: Secondary | ICD-10-CM | POA: Diagnosis not present

## 2017-04-03 DIAGNOSIS — N2581 Secondary hyperparathyroidism of renal origin: Secondary | ICD-10-CM | POA: Diagnosis not present

## 2017-04-03 DIAGNOSIS — N186 End stage renal disease: Secondary | ICD-10-CM | POA: Diagnosis not present

## 2017-04-06 DIAGNOSIS — Z23 Encounter for immunization: Secondary | ICD-10-CM | POA: Diagnosis not present

## 2017-04-06 DIAGNOSIS — N186 End stage renal disease: Secondary | ICD-10-CM | POA: Diagnosis not present

## 2017-04-06 DIAGNOSIS — N2581 Secondary hyperparathyroidism of renal origin: Secondary | ICD-10-CM | POA: Diagnosis not present

## 2017-04-06 DIAGNOSIS — D631 Anemia in chronic kidney disease: Secondary | ICD-10-CM | POA: Diagnosis not present

## 2017-04-06 DIAGNOSIS — D689 Coagulation defect, unspecified: Secondary | ICD-10-CM | POA: Diagnosis not present

## 2017-04-07 DIAGNOSIS — N186 End stage renal disease: Secondary | ICD-10-CM | POA: Diagnosis not present

## 2017-04-07 DIAGNOSIS — D631 Anemia in chronic kidney disease: Secondary | ICD-10-CM | POA: Diagnosis not present

## 2017-04-07 DIAGNOSIS — I5042 Chronic combined systolic (congestive) and diastolic (congestive) heart failure: Secondary | ICD-10-CM | POA: Diagnosis not present

## 2017-04-07 DIAGNOSIS — I132 Hypertensive heart and chronic kidney disease with heart failure and with stage 5 chronic kidney disease, or end stage renal disease: Secondary | ICD-10-CM | POA: Diagnosis not present

## 2017-04-07 DIAGNOSIS — R2689 Other abnormalities of gait and mobility: Secondary | ICD-10-CM | POA: Diagnosis not present

## 2017-04-07 DIAGNOSIS — E1122 Type 2 diabetes mellitus with diabetic chronic kidney disease: Secondary | ICD-10-CM | POA: Diagnosis not present

## 2017-04-07 NOTE — Progress Notes (Signed)
Postoperative Access Visit   History of Present Illness   Oscar Castillo is a 66 y.o. year old male who presents for postoperative follow-up for: left first stage brachial vein transposition (Date: 02/17/17).  The patient's wounds are healed.  The patient notes no steal symptoms.  The patient is able to complete their activities of daily living.  The patient's current symptoms are: none.  Past Medical History:  Diagnosis Date  . Anemia   . Antral ulcer 2014   small  . Atrial fibrillation ([place])   . Atrial fibrillation with RVR ([place]) 10/07/2016  . BACK PAIN, LUMBAR, CHRONIC   . BENIGN PROSTATIC HYPERTROPHY   . Bipolar disorder ([place])    "sometimes" (10/07/2016)  . CHOLELITHIASIS   . Chronic combined systolic and diastolic CHF (congestive heart failure) ([place])   . Complication of anesthesia    wife states pt had trouble waking up with in Nov., 2014  . CVA (cerebral vascular accident) ([place]) 07/2007  . DEPRESSION   . DIABETES MELLITUS, TYPE II    diet control  . ERECTILE DYSFUNCTION   . ESRD (end stage renal disease) on dialysis [facility])    "[place]; TTS" (10/07/2016)  . ESRD on hemodialysis ([place])    ESRD due to DM/HTN. Started dialysis in November 2013.  HD TTS at [facility] on [address].  [name] GERD   . GI bleed    due to gastritis, discharged 10/02/16/notes 10/07/2016  . Headache    "monthly" (10/07/2016)  . Hemorrhoids   . Hepatitis C    C - has been treated  . History of blood transfusion    "related to dialysis" (10/07/2016)  . History of Clostridium difficile   . History of kidney stones   . HYPERTENSION   . LBBB (left bundle branch block)   . Morbid obesity ([place])   . PAF (paroxysmal atrial fibrillation) ([place])    a. Dx 12/2015.    Past Surgical History:  Procedure Laterality Date  . AMPUTATION Left 05/12/2013   Procedure: AMPUTATION RAY;  Surgeon: Newt Minion, MD;  Location: [place];  Service: Orthopedics;  Laterality: Left;  Left Foot 1st Ray Amputation  . AMPUTATION Left  06/09/2013   Procedure: AMPUTATION BELOW KNEE;  Surgeon: Newt Minion, MD;  Location: [place];  Service: Orthopedics;  Laterality: Left;  Left Below Knee Amputation and removal proximal screws IM tibial nail  . AMPUTATION Right 09/08/2013   Procedure: AMPUTATION BELOW KNEE;  Surgeon: Newt Minion, MD;  Location: [place];  Service: Orthopedics;  Laterality: Right;  Right Below Knee Amputation  . AMPUTATION Right 10/11/2013   Procedure: AMPUTATION BELOW KNEE;  Surgeon: Newt Minion, MD;  Location: [place];  Service: Orthopedics;  Laterality: Right;  Right Below Knee Amputation Revision  . AV FISTULA PLACEMENT  06/14/2012   Procedure: ARTERIOVENOUS (AV) FISTULA CREATION;  Surgeon: Angelia Mould, MD;  Location: [facility] OR;  Service: Vascular;  Laterality: Left;  Left basilic vein transposition with fistula.  [name] BASCILIC VEIN TRANSPOSITION Left 02/17/2017   Procedure: LEFT 1ST STAGE BRACHIAL VEIN TRANSPOSITION;  Surgeon: Conrad Crystal, MD;  Location: [place];  Service: Vascular;  Laterality: Left;  . COLONOSCOPY N/A 10/28/2012   Procedure: COLONOSCOPY;  Surgeon: Jeryl Columbia, MD;  Location: [facility] ENDOSCOPY;  Service: Endoscopy;  Laterality: N/A;  . COLONOSCOPY N/A 11/02/2012   Procedure: COLONOSCOPY;  Surgeon: Cleotis Nipper, MD;  Location: [facility] ENDOSCOPY;  Service: Endoscopy;  Laterality: N/A;  .  COLONOSCOPY N/A 11/03/2012   Procedure: COLONOSCOPY;  Surgeon: Cleotis Nipper, MD;  Location: [facility] ENDOSCOPY;  Service: Endoscopy;  Laterality: N/A;  . COLONOSCOPY N/A 09/16/2016   Procedure: COLONOSCOPY;  Surgeon: Jerene Bears, MD;  Location: [facility] ENDOSCOPY;  Service: Gastroenterology;  Laterality: N/A;  . ENTEROSCOPY N/A 11/08/2012   Procedure: ENTEROSCOPY;  Surgeon: Wonda Horner, MD;  Location: [facility] ENDOSCOPY;  Service: Endoscopy;  Laterality: N/A;  . ESOPHAGOGASTRODUODENOSCOPY N/A 11/02/2012   Procedure: ESOPHAGOGASTRODUODENOSCOPY (EGD);  Surgeon: Cleotis Nipper, MD;  Location: [facility] ENDOSCOPY;  Service: Endoscopy;   Laterality: N/A;  . ESOPHAGOGASTRODUODENOSCOPY (EGD) WITH PROPOFOL N/A 09/16/2016   Procedure: ESOPHAGOGASTRODUODENOSCOPY (EGD) WITH PROPOFOL;  Surgeon: Jerene Bears, MD;  Location: [facility] ENDOSCOPY;  Service: Gastroenterology;  Laterality: N/A;  . EXCHANGE OF A DIALYSIS CATHETER Left 11/13/2016   Procedure: EXCHANGE OF A DIALYSIS CATHETER;  Surgeon: Elam Dutch, MD;  Location: [place];  Service: Vascular;  Laterality: Left;  . EYE SURGERY Left    to remove scar tissue  . FRACTURE SURGERY    . GIVENS CAPSULE STUDY N/A 11/04/2012   Procedure: GIVENS CAPSULE STUDY;  Surgeon: Cleotis Nipper, MD;  Location: [facility] ENDOSCOPY;  Service: Endoscopy;  Laterality: N/A;  . GIVENS CAPSULE STUDY N/A 09/29/2016   Procedure: GIVENS CAPSULE STUDY;  Surgeon: Manus Gunning, MD;  Location: [place];  Service: Gastroenterology;  Laterality: N/A;  try to keep pt up in bedside chair as much as possible during the study.    [name] HARDWARE REMOVAL Left 06/09/2013   Procedure: HARDWARE REMOVAL;  Surgeon: Newt Minion, MD;  Location: [place];  Service: Orthopedics;  Laterality: Left;  Left Below Knee Amputation  and Removal proximal screws IM tibial nail  . INSERTION OF DIALYSIS CATHETER N/A 11/09/2016   Procedure: INSERTION OF TEMPORARY DIALYSIS CATHETER;  Surgeon: Elam Dutch, MD;  Location: [place];  Service: Vascular;  Laterality: N/A;  . IR GENERIC HISTORICAL  09/27/2016   IR US GUIDE VASC ACCESS RIGHT 09/27/2016 Aletta Edouard, MD [facility]  . IR GENERIC HISTORICAL  09/27/2016   IR FLUORO GUIDE CV LINE RIGHT 09/27/2016 Aletta Edouard, MD [facility]  . LIGATION OF ARTERIOVENOUS  FISTULA Left 11/09/2016   Procedure: LIGATION OF ARTERIOVENOUS  FISTULA;  Surgeon: Elam Dutch, MD;  Location: [place];  Service: Vascular;  Laterality: Left;  . NEPHRECTOMY     partial RR  . ORIF FIBULA FRACTURE Left 09/09/2012   Procedure: OPEN REDUCTION INTERNAL FIXATION (ORIF) FIBULA FRACTURE;  Surgeon: Johnny Bridge, MD;   Location: [place];  Service: Orthopedics;  Laterality: Left;  . TIBIA IM NAIL INSERTION Left 09/09/2012   Procedure: INTRAMEDULLARY (IM) NAIL TIBIAL;  Surgeon: Johnny Bridge, MD;  Location: [place];  Service: Orthopedics;  Laterality: Left;  left tibial nail and open reduction internal fixation left fibula fracture  . UPPER EXTREMITY VENOGRAPHY N/A 12/14/2016   Procedure: Bilateral Upper Extremity Venography and Central Venography;  Surgeon: Conrad Wakonda, MD;  Location: [place] CV LAB;  Service: Cardiovascular;  Laterality: N/A;    Social History   Social History  . Marital status: Married    Spouse name: N/A  . Number of children: 2  . Years of education: 16   Occupational History  . disabled due to stroke Retired   Social History Main Topics  . Smoking status: Never Smoker  . Smokeless tobacco: Never Used  . Alcohol use No  . Drug use: No  . Sexual  activity: Yes    Birth control/ protection: None   Other Topics Concern  . 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 Diabetes Mother   . Hypertension Mother   . Heart attack Father   . Hypertension Father   . Coronary artery disease Other     Current Outpatient Prescriptions  Medication Sig Dispense Refill  . acetaminophen (TYLENOL) 500 MG tablet Take 500 mg by mouth every 6 (six) hours as needed for mild pain.    [name] amiodarone (PACERONE) 200 MG tablet TAKE 1 TABLET BY MOUTH DAILY, START THIS ON 11/06/16 90 tablet 3  . atorvastatin (LIPITOR) 40 MG tablet TAKE 1 TABLET(40 MG) BY MOUTH DAILY AT 6 PM 30 tablet 4  . calcium acetate (PHOSLO) 667 MG capsule Take 1,334 mg by mouth 3 (three) times daily with meals.     . latanoprost (XALATAN) 0.005 % ophthalmic solution Place 1 drop into both eyes at bedtime.     [name] oxyCODONE-acetaminophen (ROXICET) 5-325 MG tablet Take 1 tablet by mouth every 6  (six) hours as needed for moderate pain. 10 tablet 0  . pantoprazole (PROTONIX) 40 MG tablet Take 1 tablet (40 mg total) by mouth 2 (two) times daily before a meal. 60 tablet 11  . QUEtiapine (SEROQUEL) 25 MG tablet TAKE 1 TABLET(25 MG) BY MOUTH TWICE DAILY 60 tablet 8  . SENSIPAR 30 MG tablet Take 30 mg by mouth every evening.     . silver sulfADIAZINE (SILVADENE) 1 % cream Apply 1 application topically daily as needed (irratation). 50 g   . SSD 1 % cream APPLY EXTERNALLY TO THE AFFECTED AREA DAILY 50 g 0  . warfarin (COUMADIN) 2.5 MG tablet Take 1 tablet daily except 1.5 tablets on Wed and Fri or as directed by Anticoagulation Clinic 40 tablet 3   No current facility-administered medications for this visit.      Allergies  Allergen Reactions  . No Known Allergies     REVIEW OF SYSTEMS (negative unless checked):   Cardiac:  []  Chest pain or chest pressure? []  Shortness of breath upon activity? []  Shortness of breath when lying flat? []  Irregular heart rhythm?  Vascular:  []  Pain in calf, thigh, or hip brought on by walking? []  Pain in feet at night that wakes you up from your sleep? []  Blood clot in your veins? []  Leg swelling?  Pulmonary:  []  Oxygen at home? []  Productive cough? []  Wheezing?  Neurologic:  []  Sudden weakness in arms or legs? []  Sudden numbness in arms or legs? []  Sudden onset of difficult speaking or slurred speech? []  Temporary loss of vision in one eye? []  Problems with dizziness?  Gastrointestinal:  []  Blood in stool? []  Vomited blood?  Genitourinary:  []  Burning when urinating? []  Blood in urine? [x]  end stage renal disease-HD: T/R/S  Psychiatric:  []  Major depression  Hematologic:  []  Bleeding problems? []  Problems with blood clotting?  Dermatologic:  []  Rashes or ulcers?  Constitutional:  []  Fever or chills?  Ear/Nose/Throat:  []  Change in hearing? []  Nose bleeds? []  Sore throat?  Musculoskeletal:  []  Back pain? []  Joint  pain? []  Muscle pain?  Physical Examination   Vitals:   04/09/17 1328  BP: 97/71  Pulse: 81  SpO2: 96%  Weight: 210 lb (95.3 kg)  Body mass index is 30.13 kg/m.  Pulmonary Sym exp, good air movt, CTAB, no rales, rhonchi, & wheezing  Cardiac RRR, Nl S1, S2, no Murmurs, rubs or gallops   left arm Incision is healed, skin feels warm, hand grip is 5/5, sensation in digits is intact, palpable thrill, wheezy bruit can be auscultated, prior incisions consistent with BVT, on Sonosite: brachial vein appears to be >6 mm in distal 2/3, difficult to follow vein proximally    Medical Decision Making   Oscar Castillo is a 66 y.o. year old male who presents s/p left first stage brachial vein transposition   Will schedule pt for L 2nd stage BRVT on 26 SEP 18.  If occluded at that time, will proceed with LUA AVG placement.  I also discussed possible need for revision with an interposition graft. Risk, benefits, and alternatives to access surgery were discussed.   The patient is aware the risks include but are not limited to: bleeding, infection, steal syndrome, nerve damage, ischemic monomelic neuropathy, thrombosis, failure to mature, complications related to venous hypertension, need for additional procedures, death and stroke.   The patient agrees to proceed forward with the procedure.  Thank you for allowing [place] to participate in this patient's care.   Adele Barthel, MD, FACS [facility] Office: [phone] Pager: [phone]

## 2017-04-08 DIAGNOSIS — I5042 Chronic combined systolic (congestive) and diastolic (congestive) heart failure: Secondary | ICD-10-CM | POA: Diagnosis not present

## 2017-04-08 DIAGNOSIS — I132 Hypertensive heart and chronic kidney disease with heart failure and with stage 5 chronic kidney disease, or end stage renal disease: Secondary | ICD-10-CM | POA: Diagnosis not present

## 2017-04-08 DIAGNOSIS — E1122 Type 2 diabetes mellitus with diabetic chronic kidney disease: Secondary | ICD-10-CM | POA: Diagnosis not present

## 2017-04-08 DIAGNOSIS — R2689 Other abnormalities of gait and mobility: Secondary | ICD-10-CM | POA: Diagnosis not present

## 2017-04-08 DIAGNOSIS — Z23 Encounter for immunization: Secondary | ICD-10-CM | POA: Diagnosis not present

## 2017-04-08 DIAGNOSIS — N186 End stage renal disease: Secondary | ICD-10-CM | POA: Diagnosis not present

## 2017-04-08 DIAGNOSIS — D631 Anemia in chronic kidney disease: Secondary | ICD-10-CM | POA: Diagnosis not present

## 2017-04-08 DIAGNOSIS — D689 Coagulation defect, unspecified: Secondary | ICD-10-CM | POA: Diagnosis not present

## 2017-04-08 DIAGNOSIS — N2581 Secondary hyperparathyroidism of renal origin: Secondary | ICD-10-CM | POA: Diagnosis not present

## 2017-04-09 ENCOUNTER — Encounter: Payer: Self-pay | Admitting: Vascular Surgery

## 2017-04-09 ENCOUNTER — Ambulatory Visit (INDEPENDENT_AMBULATORY_CARE_PROVIDER_SITE_OTHER): Payer: Medicare Other | Admitting: Vascular Surgery

## 2017-04-09 ENCOUNTER — Other Ambulatory Visit: Payer: Self-pay

## 2017-04-09 VITALS — BP 97/71 | HR 81 | Wt 210.0 lb

## 2017-04-09 DIAGNOSIS — I638 Other cerebral infarction: Secondary | ICD-10-CM

## 2017-04-09 DIAGNOSIS — D631 Anemia in chronic kidney disease: Secondary | ICD-10-CM | POA: Diagnosis not present

## 2017-04-09 DIAGNOSIS — N186 End stage renal disease: Secondary | ICD-10-CM

## 2017-04-09 DIAGNOSIS — I5042 Chronic combined systolic (congestive) and diastolic (congestive) heart failure: Secondary | ICD-10-CM | POA: Diagnosis not present

## 2017-04-09 DIAGNOSIS — R2689 Other abnormalities of gait and mobility: Secondary | ICD-10-CM | POA: Diagnosis not present

## 2017-04-09 DIAGNOSIS — E1122 Type 2 diabetes mellitus with diabetic chronic kidney disease: Secondary | ICD-10-CM | POA: Diagnosis not present

## 2017-04-09 DIAGNOSIS — I132 Hypertensive heart and chronic kidney disease with heart failure and with stage 5 chronic kidney disease, or end stage renal disease: Secondary | ICD-10-CM | POA: Diagnosis not present

## 2017-04-10 DIAGNOSIS — N186 End stage renal disease: Secondary | ICD-10-CM | POA: Diagnosis not present

## 2017-04-10 DIAGNOSIS — N2581 Secondary hyperparathyroidism of renal origin: Secondary | ICD-10-CM | POA: Diagnosis not present

## 2017-04-10 DIAGNOSIS — D689 Coagulation defect, unspecified: Secondary | ICD-10-CM | POA: Diagnosis not present

## 2017-04-10 DIAGNOSIS — D631 Anemia in chronic kidney disease: Secondary | ICD-10-CM | POA: Diagnosis not present

## 2017-04-10 DIAGNOSIS — Z23 Encounter for immunization: Secondary | ICD-10-CM | POA: Diagnosis not present

## 2017-04-12 ENCOUNTER — Telehealth: Payer: Self-pay | Admitting: Pharmacist Clinician (PhC)/ Clinical Pharmacy Specialist

## 2017-04-12 DIAGNOSIS — R2689 Other abnormalities of gait and mobility: Secondary | ICD-10-CM | POA: Diagnosis not present

## 2017-04-12 DIAGNOSIS — I5042 Chronic combined systolic (congestive) and diastolic (congestive) heart failure: Secondary | ICD-10-CM | POA: Diagnosis not present

## 2017-04-12 DIAGNOSIS — I132 Hypertensive heart and chronic kidney disease with heart failure and with stage 5 chronic kidney disease, or end stage renal disease: Secondary | ICD-10-CM | POA: Diagnosis not present

## 2017-04-12 DIAGNOSIS — E1122 Type 2 diabetes mellitus with diabetic chronic kidney disease: Secondary | ICD-10-CM | POA: Diagnosis not present

## 2017-04-12 DIAGNOSIS — D631 Anemia in chronic kidney disease: Secondary | ICD-10-CM | POA: Diagnosis not present

## 2017-04-12 DIAGNOSIS — N186 End stage renal disease: Secondary | ICD-10-CM | POA: Diagnosis not present

## 2017-04-12 NOTE — Telephone Encounter (Signed)
Jinny Blossom Can you call his wife first thing Tuesday and set up time for him to come to [facility] Tuesday or Wednesday for bridging instruction.  I spoke with him Monday afternoon, but he says we need to make arrangements with wife.  He goes to dialysis TTS at 6am.

## 2017-04-12 NOTE — Telephone Encounter (Signed)
Fax from  [facility] to hold warfarin x 5 days for second stage L brachial vein transposition.    Patient with diagnosis of atrial fibrillation on warfarin for anticoagulation.    Procedure: brachial vein transposition Date of procedure: 04/21/17  CHADS2 score of 5 (CHF, HTN, DM2, stroke/tia x 2);  CHADS2-VASc score of  6 (CHF, HTN, AGE, DM2, stroke/tia x 2)  Platelet count 214  Per office protocol, patient can hold warfarin for 5 days prior to procedure.   Patient will need bridging with Lovenox (enoxaparin) around procedure.

## 2017-04-13 ENCOUNTER — Ambulatory Visit (INDEPENDENT_AMBULATORY_CARE_PROVIDER_SITE_OTHER): Payer: Medicare Other | Admitting: *Deleted

## 2017-04-13 DIAGNOSIS — Z5181 Encounter for therapeutic drug level monitoring: Secondary | ICD-10-CM | POA: Diagnosis not present

## 2017-04-13 DIAGNOSIS — I4891 Unspecified atrial fibrillation: Secondary | ICD-10-CM | POA: Diagnosis not present

## 2017-04-13 DIAGNOSIS — N2581 Secondary hyperparathyroidism of renal origin: Secondary | ICD-10-CM | POA: Diagnosis not present

## 2017-04-13 DIAGNOSIS — G459 Transient cerebral ischemic attack, unspecified: Secondary | ICD-10-CM | POA: Diagnosis not present

## 2017-04-13 DIAGNOSIS — D689 Coagulation defect, unspecified: Secondary | ICD-10-CM | POA: Diagnosis not present

## 2017-04-13 DIAGNOSIS — Z23 Encounter for immunization: Secondary | ICD-10-CM | POA: Diagnosis not present

## 2017-04-13 DIAGNOSIS — D631 Anemia in chronic kidney disease: Secondary | ICD-10-CM | POA: Diagnosis not present

## 2017-04-13 DIAGNOSIS — Z8679 Personal history of other diseases of the circulatory system: Secondary | ICD-10-CM | POA: Diagnosis not present

## 2017-04-13 DIAGNOSIS — N186 End stage renal disease: Secondary | ICD-10-CM | POA: Diagnosis not present

## 2017-04-13 LAB — POCT INR: INR: 2.7

## 2017-04-13 MED ORDER — ENOXAPARIN SODIUM 100 MG/ML ~~LOC~~ SOLN: 100.0000 mg | SUBCUTANEOUS | 1 refills | Status: DC

## 2017-04-13 NOTE — Patient Instructions (Addendum)
04/15/17: Last dose of Coumadin.  04/16/17: No Coumadin or Lovenox.  04/17/17: Inject Lovenox 100mg  in the fatty abdominal tissue at least 2 inches from the belly button daily at 5 am. No Coumadin.  04/18/17: Inject Lovenox in the fatty tissue at 5 am. No Coumadin.  04/19/17: Inject Lovenox in the fatty tissue at 5 am. No Coumadin.  04/20/17: Inject Lovenox in the fatty tissue in the morning at 5 am.  No Coumadin.  04/21/17: Procedure Day - No Lovenox - Resume Coumadin in the evening or as directed by doctor (take an extra half tablet with usual dose for 2 days then resume normal dose).  04/22/17: Resume Lovenox inject in the fatty tissue at 11am and take Coumadin.  04/23/17: Inject Lovenox in the fatty tissue at 11 am take Coumadin.  04/24/17: Inject Lovenox in the fatty tissue at 11 am and take Coumadin.  04/25/17: Inject Lovenox in the fatty tissue at 11 am and take Coumadin.  04/26/17: Inject Lovenox in the fatty tissue at 11 am and take Coumadin.  04/27/17: Inject Lovenox in the fatty tissue at 11 am.   Coumadin appt to check INR.

## 2017-04-13 NOTE — Telephone Encounter (Signed)
Called made appt for INR check today 04/13/17 at 1:30pm, for Lovenox bridging.

## 2017-04-14 DIAGNOSIS — N186 End stage renal disease: Secondary | ICD-10-CM | POA: Diagnosis not present

## 2017-04-14 DIAGNOSIS — E1122 Type 2 diabetes mellitus with diabetic chronic kidney disease: Secondary | ICD-10-CM | POA: Diagnosis not present

## 2017-04-14 DIAGNOSIS — D631 Anemia in chronic kidney disease: Secondary | ICD-10-CM | POA: Diagnosis not present

## 2017-04-14 DIAGNOSIS — I5042 Chronic combined systolic (congestive) and diastolic (congestive) heart failure: Secondary | ICD-10-CM | POA: Diagnosis not present

## 2017-04-14 DIAGNOSIS — R2689 Other abnormalities of gait and mobility: Secondary | ICD-10-CM | POA: Diagnosis not present

## 2017-04-14 DIAGNOSIS — I132 Hypertensive heart and chronic kidney disease with heart failure and with stage 5 chronic kidney disease, or end stage renal disease: Secondary | ICD-10-CM | POA: Diagnosis not present

## 2017-04-15 DIAGNOSIS — Z23 Encounter for immunization: Secondary | ICD-10-CM | POA: Diagnosis not present

## 2017-04-15 DIAGNOSIS — N2581 Secondary hyperparathyroidism of renal origin: Secondary | ICD-10-CM | POA: Diagnosis not present

## 2017-04-15 DIAGNOSIS — D689 Coagulation defect, unspecified: Secondary | ICD-10-CM | POA: Diagnosis not present

## 2017-04-15 DIAGNOSIS — D631 Anemia in chronic kidney disease: Secondary | ICD-10-CM | POA: Diagnosis not present

## 2017-04-15 DIAGNOSIS — N186 End stage renal disease: Secondary | ICD-10-CM | POA: Diagnosis not present

## 2017-04-17 DIAGNOSIS — D631 Anemia in chronic kidney disease: Secondary | ICD-10-CM | POA: Diagnosis not present

## 2017-04-17 DIAGNOSIS — N186 End stage renal disease: Secondary | ICD-10-CM | POA: Diagnosis not present

## 2017-04-17 DIAGNOSIS — Z23 Encounter for immunization: Secondary | ICD-10-CM | POA: Diagnosis not present

## 2017-04-17 DIAGNOSIS — N2581 Secondary hyperparathyroidism of renal origin: Secondary | ICD-10-CM | POA: Diagnosis not present

## 2017-04-17 DIAGNOSIS — D689 Coagulation defect, unspecified: Secondary | ICD-10-CM | POA: Diagnosis not present

## 2017-04-19 DIAGNOSIS — I132 Hypertensive heart and chronic kidney disease with heart failure and with stage 5 chronic kidney disease, or end stage renal disease: Secondary | ICD-10-CM | POA: Diagnosis not present

## 2017-04-19 DIAGNOSIS — E1122 Type 2 diabetes mellitus with diabetic chronic kidney disease: Secondary | ICD-10-CM | POA: Diagnosis not present

## 2017-04-19 DIAGNOSIS — D631 Anemia in chronic kidney disease: Secondary | ICD-10-CM | POA: Diagnosis not present

## 2017-04-19 DIAGNOSIS — N186 End stage renal disease: Secondary | ICD-10-CM | POA: Diagnosis not present

## 2017-04-19 DIAGNOSIS — I5042 Chronic combined systolic (congestive) and diastolic (congestive) heart failure: Secondary | ICD-10-CM | POA: Diagnosis not present

## 2017-04-19 DIAGNOSIS — R2689 Other abnormalities of gait and mobility: Secondary | ICD-10-CM | POA: Diagnosis not present

## 2017-04-20 ENCOUNTER — Encounter (HOSPITAL_COMMUNITY): Payer: Self-pay | Admitting: *Deleted

## 2017-04-20 DIAGNOSIS — N186 End stage renal disease: Secondary | ICD-10-CM | POA: Diagnosis not present

## 2017-04-20 DIAGNOSIS — N2581 Secondary hyperparathyroidism of renal origin: Secondary | ICD-10-CM | POA: Diagnosis not present

## 2017-04-20 DIAGNOSIS — D689 Coagulation defect, unspecified: Secondary | ICD-10-CM | POA: Diagnosis not present

## 2017-04-20 DIAGNOSIS — D631 Anemia in chronic kidney disease: Secondary | ICD-10-CM | POA: Diagnosis not present

## 2017-04-20 DIAGNOSIS — Z23 Encounter for immunization: Secondary | ICD-10-CM | POA: Diagnosis not present

## 2017-04-20 NOTE — Progress Notes (Signed)
Completed preop phone call with wife.

## 2017-04-20 NOTE — Progress Notes (Signed)
Anesthesia chart review: [name].  Patient is a 66 year old male scheduled for 2nd stage left arm basilic vein transposition on 04/21/17 by Dr. Adele Barthel. He underwent 1st stage on 02/17/17 (postponed from 01/06/17 until after he obtained cardiac clearance following 01/22/17 visit with Dr. Dorris Carnes).   History includes non-smoker, ESRD (ligation LUE AVF 11/09/16; dialysis catheter exchange 11/13/16; 1st stage LUE BVT 02/17/17; HD TTS), HTN, hepatitis C, PAF (12/2015; afib with RVR 10/07/16; on warfarin and amiodarone), chronic combined systolic and diastolic CHF, left bundle branch block, CVA '09, Bipolar disorder, GI bleed 09/2016, left BKA 06/09/13, right BKA 09/08/13 with revision 10/11/13, ED, GERD, depression, BPH, C. difficile, anemia, ORIF left fibula fracture/IM left tibia 09/09/12. - Hospitalization 11/07/16-11/14/16 for Staph aureus sepsis due to infected AVF s/p removal and dialysis catheter. ID consulted. Echo done (see below). Repeat blood cultures sent on 4/18 negative. Blood cultures from 4/17 positive for S hominis, likely contaminant. Patient will be discharged on Cefazolin 2 gm IV on T-Th- Sat dialysis till 11/26/16.  PCP is Dr. [name]. Last visit 02/10/17.   Cardiologist is Dr. Dorris Carnes. Last visit 01/22/17. She cleared him for first stage BVT at that time with "low risk for major cardiac event with surgery." She recommended Lovenox bridging with history of CVA. [facility] is coordinating bridge for 04/21/17 surgery (note in Media tab).  Meds include amiodarone, Lipitor, PhosLo, warfarin (on hold), Lovenox bridge, Xalatan ophthalmic, Roxicet, Protonix, Seroquel, Sensipar.   EKG 11/08/16: SR, first degree AV block, left BBB (old).  Echo 11/09/16 (during admission for sepsis related to dialysis access): Study Conclusions - Left ventricle: The cavity size was normal. Systolic function was mildly reduced. The estimated ejection fraction was in the range of 45% to 50%.  There is severe hypokinesis of the basal-mid anteroseptal myocardium. - Aortic valve: There was very mild stenosis. - Mitral valve: Calcified annulus. Moderately thickened, severely calcified leaflets posterior. Mobility of the posterior leaflet was moderately restricted. Transvalvular velocity was minimally increased. The findings are consistent with trivial stenosis. There was moderate regurgitation. - Left atrium: The atrium was mildly dilated. - Right atrium: The atrium was mildly dilated. - Pulmonary arteries: Systolic pressure was mildly increased. PA peak pressure: 34 mm Hg (S). (Comparison echo 12/27/15 showed LVEF 50-55%, normal wall motion, no regional wall motion abnormalities, mild MR.)   Nuclear stress test 12/30/15:  Downsloping ST segment depression ST segment depression was noted during stress in the II, V5 and V6 leads.  This is an intermediate risk study.  The left ventricular ejection fraction is moderately decreased (30-44%). 1. There was a fixed, medium-sized, mild mid to apical anteroseptal and inferoseptal perfusion defect.  2. EF 36% with septal wall motion abnormality. 3. Intermediate risk study. Findings can all be explained by LBBB itself (perfusion defect due to artifact from LBBB), however cannot rule out prior infarction. No significant ischemia. Intermediate risk due to low EF. Would get echo to confirm EF.   PCXR 11/13/16: IMPRESSION: Template dialysis catheter projects deep in the right atrium. Negative for pneumothorax. Cardiomegaly without edema.  He will need labs prior to surgery.    He had recent cardiology follow-up and tolerated recent dialysis access procedure. If labs acceptable and otherwise no acute changes then I anticipate that he can proceed as planned.   [facility] Short Stay Center/Anesthesiology Phone [phone] 04/20/2017 4:18 PM

## 2017-04-21 ENCOUNTER — Telehealth: Payer: Self-pay | Admitting: Vascular Surgery

## 2017-04-21 ENCOUNTER — Ambulatory Visit (HOSPITAL_COMMUNITY)
Admission: RE | Admit: 2017-04-21 | Discharge: 2017-04-21 | Disposition: A | Discharge status: Home / Self Care | Payer: Medicare Other | Source: Ambulatory Visit | Attending: Vascular Surgery | Admitting: Vascular Surgery

## 2017-04-21 ENCOUNTER — Encounter (HOSPITAL_COMMUNITY)
Admission: RE | Disposition: A | Discharge status: Home / Self Care | Payer: Self-pay | Source: Ambulatory Visit | Attending: Vascular Surgery

## 2017-04-21 ENCOUNTER — Ambulatory Visit (HOSPITAL_COMMUNITY): Payer: Medicare Other | Admitting: Vascular Surgery

## 2017-04-21 ENCOUNTER — Encounter (HOSPITAL_COMMUNITY): Payer: Self-pay

## 2017-04-21 DIAGNOSIS — Z7901 Long term (current) use of anticoagulants: Secondary | ICD-10-CM | POA: Insufficient documentation

## 2017-04-21 DIAGNOSIS — T82858A Stenosis of vascular prosthetic devices, implants and grafts, initial encounter: Secondary | ICD-10-CM | POA: Insufficient documentation

## 2017-04-21 DIAGNOSIS — I480 Paroxysmal atrial fibrillation: Secondary | ICD-10-CM | POA: Insufficient documentation

## 2017-04-21 DIAGNOSIS — Z89512 Acquired absence of left leg below knee: Secondary | ICD-10-CM | POA: Insufficient documentation

## 2017-04-21 DIAGNOSIS — N186 End stage renal disease: Secondary | ICD-10-CM | POA: Diagnosis not present

## 2017-04-21 DIAGNOSIS — F319 Bipolar disorder, unspecified: Secondary | ICD-10-CM | POA: Insufficient documentation

## 2017-04-21 DIAGNOSIS — N400 Enlarged prostate without lower urinary tract symptoms: Secondary | ICD-10-CM | POA: Insufficient documentation

## 2017-04-21 DIAGNOSIS — Z6830 Body mass index (BMI) 30.0-30.9, adult: Secondary | ICD-10-CM | POA: Insufficient documentation

## 2017-04-21 DIAGNOSIS — B1920 Unspecified viral hepatitis C without hepatic coma: Secondary | ICD-10-CM | POA: Diagnosis not present

## 2017-04-21 DIAGNOSIS — I132 Hypertensive heart and chronic kidney disease with heart failure and with stage 5 chronic kidney disease, or end stage renal disease: Secondary | ICD-10-CM | POA: Insufficient documentation

## 2017-04-21 DIAGNOSIS — Z992 Dependence on renal dialysis: Secondary | ICD-10-CM | POA: Insufficient documentation

## 2017-04-21 DIAGNOSIS — Y832 Surgical operation with anastomosis, bypass or graft as the cause of abnormal reaction of the patient, or of later complication, without mention of misadventure at the time of the procedure: Secondary | ICD-10-CM | POA: Diagnosis not present

## 2017-04-21 DIAGNOSIS — G8929 Other chronic pain: Secondary | ICD-10-CM | POA: Diagnosis not present

## 2017-04-21 DIAGNOSIS — Z8673 Personal history of transient ischemic attack (TIA), and cerebral infarction without residual deficits: Secondary | ICD-10-CM | POA: Insufficient documentation

## 2017-04-21 DIAGNOSIS — I5042 Chronic combined systolic (congestive) and diastolic (congestive) heart failure: Secondary | ICD-10-CM | POA: Insufficient documentation

## 2017-04-21 DIAGNOSIS — I447 Left bundle-branch block, unspecified: Secondary | ICD-10-CM | POA: Insufficient documentation

## 2017-04-21 DIAGNOSIS — G459 Transient cerebral ischemic attack, unspecified: Secondary | ICD-10-CM | POA: Diagnosis not present

## 2017-04-21 DIAGNOSIS — Z89511 Acquired absence of right leg below knee: Secondary | ICD-10-CM | POA: Diagnosis not present

## 2017-04-21 DIAGNOSIS — E1122 Type 2 diabetes mellitus with diabetic chronic kidney disease: Secondary | ICD-10-CM | POA: Diagnosis not present

## 2017-04-21 DIAGNOSIS — N185 Chronic kidney disease, stage 5: Secondary | ICD-10-CM | POA: Diagnosis not present

## 2017-04-21 DIAGNOSIS — M545 Low back pain: Secondary | ICD-10-CM | POA: Insufficient documentation

## 2017-04-21 DIAGNOSIS — K219 Gastro-esophageal reflux disease without esophagitis: Secondary | ICD-10-CM | POA: Diagnosis not present

## 2017-04-21 HISTORY — PX: BASCILIC VEIN TRANSPOSITION: SHX5742

## 2017-04-21 LAB — POCT I-STAT 4, (NA,K, GLUC, HGB,HCT)
Glucose, Bld: 115 mg/dL — ABNORMAL HIGH (ref 65–99)
HCT: 43 % (ref 39.0–52.0)
HEMOGLOBIN: 14.6 g/dL (ref 13.0–17.0)
Potassium: 5 mmol/L (ref 3.5–5.1)
SODIUM: 137 mmol/L (ref 135–145)

## 2017-04-21 LAB — GLUCOSE, CAPILLARY: Glucose-Capillary: 92 mg/dL (ref 65–99)

## 2017-04-21 LAB — PROTIME-INR
INR: 1.59
Prothrombin Time: 18.8 s — ABNORMAL HIGH (ref 11.4–15.2)

## 2017-04-21 SURGERY — TRANSPOSITION, VEIN, BASILIC
Anesthesia: General | Site: Arm Upper | Laterality: Left

## 2017-04-21 MED ORDER — LIDOCAINE HCL (CARDIAC) 20 MG/ML IV SOLN
INTRAVENOUS | Status: DC | PRN
  Administered 2017-04-21: 100 mg via INTRATRACHEAL

## 2017-04-21 MED ORDER — PROPOFOL 10 MG/ML IV BOLUS
INTRAVENOUS | Status: AC
  Filled 2017-04-21: qty 20

## 2017-04-21 MED ORDER — PHENYLEPHRINE HCL 10 MG/ML IJ SOLN
INTRAMUSCULAR | Status: DC | PRN
  Administered 2017-04-21: 120 ug via INTRAVENOUS

## 2017-04-21 MED ORDER — HEPARIN SODIUM (PORCINE) 1000 UNIT/ML IJ SOLN
INTRAMUSCULAR | Status: AC
  Filled 2017-04-21: qty 1

## 2017-04-21 MED ORDER — HYDROMORPHONE HCL 1 MG/ML IJ SOLN: 0.2500 mg | INTRAMUSCULAR | Status: DC | PRN

## 2017-04-21 MED ORDER — LIDOCAINE HCL (PF) 1 % IJ SOLN
INTRAMUSCULAR | Status: DC | PRN
Start: 2017-04-21 — End: 2017-04-21

## 2017-04-21 MED ORDER — FENTANYL CITRATE (PF) 250 MCG/5ML IJ SOLN
INTRAMUSCULAR | Status: DC | PRN
Start: 2017-04-21 — End: 2017-04-21
  Administered 2017-04-21: 50 ug via INTRAVENOUS
  Administered 2017-04-21: 100 ug via INTRAVENOUS

## 2017-04-21 MED ORDER — OXYCODONE HCL 5 MG PO TABS: 5.0000 mg | ORAL_TABLET | Freq: Once | ORAL | Status: DC | PRN

## 2017-04-21 MED ORDER — GLYCOPYRROLATE 0.2 MG/ML IJ SOLN
INTRAMUSCULAR | Status: DC | PRN
  Administered 2017-04-21: 0.2 mg via INTRAVENOUS

## 2017-04-21 MED ORDER — DEXTROSE 5 % IV SOLN
INTRAVENOUS | Status: AC
  Filled 2017-04-21: qty 1.5

## 2017-04-21 MED ORDER — DEXTROSE 5 % IV SOLN
1.5000 g | INTRAVENOUS | Status: AC
  Administered 2017-04-21: 1.5 g via INTRAVENOUS

## 2017-04-21 MED ORDER — PROTAMINE SULFATE 10 MG/ML IV SOLN
INTRAVENOUS | Status: DC | PRN
  Administered 2017-04-21 (×2): 10 mg via INTRAVENOUS
  Administered 2017-04-21: 20 mg via INTRAVENOUS

## 2017-04-21 MED ORDER — CHLORHEXIDINE GLUCONATE CLOTH 2 % EX PADS: 6.0000 | MEDICATED_PAD | Freq: Once | CUTANEOUS | Status: DC

## 2017-04-21 MED ORDER — HEPARIN SODIUM (PORCINE) 1000 UNIT/ML IJ SOLN
INTRAMUSCULAR | Status: DC | PRN
  Administered 2017-04-21: 9000 [IU] via INTRAVENOUS

## 2017-04-21 MED ORDER — 0.9 % SODIUM CHLORIDE (POUR BTL) OPTIME
TOPICAL | Status: DC | PRN
  Administered 2017-04-21: 1000 mL

## 2017-04-21 MED ORDER — OXYCODONE-ACETAMINOPHEN 5-325 MG PO TABS
1.0000 | ORAL_TABLET | ORAL | 0 refills | Status: DC | PRN
Start: 2017-04-21 — End: 2017-04-29

## 2017-04-21 MED ORDER — PHENYLEPHRINE HCL 10 MG/ML IJ SOLN
INTRAMUSCULAR | Status: DC | PRN
  Administered 2017-04-21: 50 ug/min via INTRAVENOUS

## 2017-04-21 MED ORDER — PROPOFOL 10 MG/ML IV BOLUS
INTRAVENOUS | Status: DC | PRN
  Administered 2017-04-21: 20 mg via INTRAVENOUS
  Administered 2017-04-21: 110 mg via INTRAVENOUS

## 2017-04-21 MED ORDER — PROTAMINE SULFATE 10 MG/ML IV SOLN: INTRAVENOUS | Status: DC | PRN

## 2017-04-21 MED ORDER — OXYCODONE HCL 5 MG/5ML PO SOLN: 5.0000 mg | Freq: Once | ORAL | Status: DC | PRN

## 2017-04-21 MED ORDER — SODIUM CHLORIDE 0.9 % IV SOLN
INTRAVENOUS | Status: DC | PRN
  Administered 2017-04-21: 07:00:00 500 mL

## 2017-04-21 MED ORDER — ONDANSETRON HCL 4 MG/2ML IJ SOLN
INTRAMUSCULAR | Status: DC | PRN
  Administered 2017-04-21: 4 mg via INTRAVENOUS

## 2017-04-21 MED ORDER — LIDOCAINE 2% (20 MG/ML) 5 ML SYRINGE
INTRAMUSCULAR | Status: AC
  Filled 2017-04-21: qty 5

## 2017-04-21 MED ORDER — HEMOSTATIC AGENTS (NO CHARGE) OPTIME
TOPICAL | Status: DC | PRN
  Administered 2017-04-21: 2 via TOPICAL

## 2017-04-21 MED ORDER — MIDAZOLAM HCL 2 MG/2ML IJ SOLN
INTRAMUSCULAR | Status: AC
  Filled 2017-04-21: qty 2

## 2017-04-21 MED ORDER — FENTANYL CITRATE (PF) 250 MCG/5ML IJ SOLN
INTRAMUSCULAR | Status: AC
  Filled 2017-04-21: qty 5

## 2017-04-21 MED ORDER — SODIUM CHLORIDE 0.9 % IV SOLN
INTRAVENOUS | Status: DC
  Administered 2017-04-21 (×2): via INTRAVENOUS

## 2017-04-21 SURGICAL SUPPLY — 44 items
ADH SKN CLS APL DERMABOND .7 (GAUZE/BANDAGES/DRESSINGS) ×2
AGENT HMST SPONGE THK3/8 (HEMOSTASIS) ×2
ARMBAND PINK RESTRICT EXTREMIT (MISCELLANEOUS) ×3 IMPLANT
BIOPATCH RED 1 DISK 7.0 (GAUZE/BANDAGES/DRESSINGS) ×1 IMPLANT
BIOPATCH RED 1IN DISK 7.0MM (GAUZE/BANDAGES/DRESSINGS) ×1
CANISTER SUCT 3000ML PPV (MISCELLANEOUS) ×3 IMPLANT
CANNULA VESSEL 3MM 2 BLNT TIP (CANNULA) ×2 IMPLANT
CLIP VESOCCLUDE MED 24/CT (CLIP) ×3 IMPLANT
CLIP VESOCCLUDE SM WIDE 24/CT (CLIP) ×3 IMPLANT
CORDS BIPOLAR (ELECTRODE) IMPLANT
COVER PROBE W GEL 5X96 (DRAPES) ×2 IMPLANT
DECANTER SPIKE VIAL GLASS SM (MISCELLANEOUS) ×3 IMPLANT
DERMABOND ADVANCED (GAUZE/BANDAGES/DRESSINGS) ×4
DERMABOND ADVANCED .7 DNX12 (GAUZE/BANDAGES/DRESSINGS) ×1 IMPLANT
ELECT REM PT RETURN 9FT ADLT (ELECTROSURGICAL) ×3
ELECTRODE REM PT RTRN 9FT ADLT (ELECTROSURGICAL) ×1 IMPLANT
GAUZE SPONGE 4X4 16PLY XRAY LF (GAUZE/BANDAGES/DRESSINGS) ×2 IMPLANT
GLOVE BIO SURGEON STRL SZ 6.5 (GLOVE) ×1 IMPLANT
GLOVE BIO SURGEON STRL SZ7 (GLOVE) ×3 IMPLANT
GLOVE BIO SURGEONS STRL SZ 6.5 (GLOVE) ×1
GLOVE BIOGEL PI IND STRL 7.5 (GLOVE) ×1 IMPLANT
GLOVE BIOGEL PI INDICATOR 7.5 (GLOVE) ×4
GLOVE ECLIPSE 7.0 STRL STRAW (GLOVE) ×2 IMPLANT
GOWN STRL REUS W/ TWL LRG LVL3 (GOWN DISPOSABLE) ×3 IMPLANT
GOWN STRL REUS W/TWL LRG LVL3 (GOWN DISPOSABLE) ×9
HEMOSTAT SPONGE AVITENE ULTRA (HEMOSTASIS) ×4 IMPLANT
KIT BASIN OR (CUSTOM PROCEDURE TRAY) ×3 IMPLANT
KIT ROOM TURNOVER OR (KITS) ×3 IMPLANT
NS IRRIG 1000ML POUR BTL (IV SOLUTION) ×3 IMPLANT
PACK CV ACCESS (CUSTOM PROCEDURE TRAY) ×3 IMPLANT
PAD ARMBOARD 7.5X6 YLW CONV (MISCELLANEOUS) ×6 IMPLANT
SUT MNCRL AB 4-0 PS2 18 (SUTURE) ×5 IMPLANT
SUT PROLENE 6 0 BV (SUTURE) ×3 IMPLANT
SUT PROLENE 7 0 BV 1 (SUTURE) ×4 IMPLANT
SUT SILK 2 0 SH (SUTURE) ×2 IMPLANT
SUT SILK 3 0 (SUTURE) ×3
SUT SILK 3-0 18XBRD TIE 12 (SUTURE) IMPLANT
SUT VIC AB 2-0 CT1 27 (SUTURE) ×6
SUT VIC AB 2-0 CT1 TAPERPNT 27 (SUTURE) ×1 IMPLANT
SUT VIC AB 3-0 SH 27 (SUTURE) ×18
SUT VIC AB 3-0 SH 27X BRD (SUTURE) ×2 IMPLANT
SUT VIC AB 4-0 PS2 27 (SUTURE) IMPLANT
UNDERPAD 30X30 (UNDERPADS AND DIAPERS) ×3 IMPLANT
WATER STERILE IRR 1000ML POUR (IV SOLUTION) ×3 IMPLANT

## 2017-04-21 NOTE — Anesthesia Postprocedure Evaluation (Signed)
Anesthesia Post Note  Patient: Oscar Castillo  Procedure(s) Performed: Procedure(s) (LRB): LEFT 2ND STAGE BRACHIAL VEIN TRANSPOSITION (Left)     Patient location during evaluation: PACU Anesthesia Type: General Level of consciousness: awake and alert Pain management: pain level controlled Vital Signs Assessment: post-procedure vital signs reviewed and stable Respiratory status: spontaneous breathing, nonlabored ventilation, respiratory function stable and patient connected to nasal cannula oxygen Cardiovascular status: blood pressure returned to baseline and stable Postop Assessment: no apparent nausea or vomiting Anesthetic complications: no    Last Vitals:  Vitals:   04/21/17 1120 04/21/17 1150  BP: 109/74 139/87  Pulse: 74 71  Resp: 13 16  Temp:    SpO2: 100% 100%    Last Pain:  Vitals:   04/21/17 1150  TempSrc:   PainSc: 0-No pain                 Vaudine Dutan,JAMES TERRILL

## 2017-04-21 NOTE — H&P (View-Only) (Signed)
Postoperative Access Visit   History of Present Illness   Oscar Castillo is a 66 y.o. year old male who presents for postoperative follow-up for: left first stage brachial vein transposition (Date: 02/17/17).  The patient's wounds are healed.  The patient notes no steal symptoms.  The patient is able to complete their activities of daily living.  The patient's current symptoms are: none.  Past Medical History:  Diagnosis Date  . Anemia   . Antral ulcer 2014   small  . Atrial fibrillation ([place])   . Atrial fibrillation with RVR ([place]) 10/07/2016  . BACK PAIN, LUMBAR, CHRONIC   . BENIGN PROSTATIC HYPERTROPHY   . Bipolar disorder ([place])    "sometimes" (10/07/2016)  . CHOLELITHIASIS   . Chronic combined systolic and diastolic CHF (congestive heart failure) ([place])   . Complication of anesthesia    wife states pt had trouble waking up with in Nov., 2014  . CVA (cerebral vascular accident) ([place]) 07/2007  . DEPRESSION   . DIABETES MELLITUS, TYPE II    diet control  . ERECTILE DYSFUNCTION   . ESRD (end stage renal disease) on dialysis [facility])    "[place]; TTS" (10/07/2016)  . ESRD on hemodialysis ([place])    ESRD due to DM/HTN. Started dialysis in November 2013.  HD TTS at [facility] on [address].  [name] GERD   . GI bleed    due to gastritis, discharged 10/02/16/notes 10/07/2016  . Headache    "monthly" (10/07/2016)  . Hemorrhoids   . Hepatitis C    C - has been treated  . History of blood transfusion    "related to dialysis" (10/07/2016)  . History of Clostridium difficile   . History of kidney stones   . HYPERTENSION   . LBBB (left bundle branch block)   . Morbid obesity ([place])   . PAF (paroxysmal atrial fibrillation) ([place])    a. Dx 12/2015.    Past Surgical History:  Procedure Laterality Date  . AMPUTATION Left 05/12/2013   Procedure: AMPUTATION RAY;  Surgeon: Newt Minion, MD;  Location: [place];  Service: Orthopedics;  Laterality: Left;  Left Foot 1st Ray Amputation  . AMPUTATION Left  06/09/2013   Procedure: AMPUTATION BELOW KNEE;  Surgeon: Newt Minion, MD;  Location: [place];  Service: Orthopedics;  Laterality: Left;  Left Below Knee Amputation and removal proximal screws IM tibial nail  . AMPUTATION Right 09/08/2013   Procedure: AMPUTATION BELOW KNEE;  Surgeon: Newt Minion, MD;  Location: [place];  Service: Orthopedics;  Laterality: Right;  Right Below Knee Amputation  . AMPUTATION Right 10/11/2013   Procedure: AMPUTATION BELOW KNEE;  Surgeon: Newt Minion, MD;  Location: [place];  Service: Orthopedics;  Laterality: Right;  Right Below Knee Amputation Revision  . AV FISTULA PLACEMENT  06/14/2012   Procedure: ARTERIOVENOUS (AV) FISTULA CREATION;  Surgeon: Angelia Mould, MD;  Location: [facility] OR;  Service: Vascular;  Laterality: Left;  Left basilic vein transposition with fistula.  [name] BASCILIC VEIN TRANSPOSITION Left 02/17/2017   Procedure: LEFT 1ST STAGE BRACHIAL VEIN TRANSPOSITION;  Surgeon: Conrad [place], MD;  Location: [place];  Service: Vascular;  Laterality: Left;  . COLONOSCOPY N/A 10/28/2012   Procedure: COLONOSCOPY;  Surgeon: Jeryl Columbia, MD;  Location: [facility] ENDOSCOPY;  Service: Endoscopy;  Laterality: N/A;  . COLONOSCOPY N/A 11/02/2012   Procedure: COLONOSCOPY;  Surgeon: Cleotis Nipper, MD;  Location: [facility] ENDOSCOPY;  Service: Endoscopy;  Laterality: N/A;  .  COLONOSCOPY N/A 11/03/2012   Procedure: COLONOSCOPY;  Surgeon: Cleotis Nipper, MD;  Location: [facility] ENDOSCOPY;  Service: Endoscopy;  Laterality: N/A;  . COLONOSCOPY N/A 09/16/2016   Procedure: COLONOSCOPY;  Surgeon: Jerene Bears, MD;  Location: [facility] ENDOSCOPY;  Service: Gastroenterology;  Laterality: N/A;  . ENTEROSCOPY N/A 11/08/2012   Procedure: ENTEROSCOPY;  Surgeon: Wonda Horner, MD;  Location: [facility] ENDOSCOPY;  Service: Endoscopy;  Laterality: N/A;  . ESOPHAGOGASTRODUODENOSCOPY N/A 11/02/2012   Procedure: ESOPHAGOGASTRODUODENOSCOPY (EGD);  Surgeon: Cleotis Nipper, MD;  Location: [facility] ENDOSCOPY;  Service: Endoscopy;   Laterality: N/A;  . ESOPHAGOGASTRODUODENOSCOPY (EGD) WITH PROPOFOL N/A 09/16/2016   Procedure: ESOPHAGOGASTRODUODENOSCOPY (EGD) WITH PROPOFOL;  Surgeon: Jerene Bears, MD;  Location: [facility] ENDOSCOPY;  Service: Gastroenterology;  Laterality: N/A;  . EXCHANGE OF A DIALYSIS CATHETER Left 11/13/2016   Procedure: EXCHANGE OF A DIALYSIS CATHETER;  Surgeon: Elam Dutch, MD;  Location: [place];  Service: Vascular;  Laterality: Left;  . EYE SURGERY Left    to remove scar tissue  . FRACTURE SURGERY    . GIVENS CAPSULE STUDY N/A 11/04/2012   Procedure: GIVENS CAPSULE STUDY;  Surgeon: Cleotis Nipper, MD;  Location: [facility] ENDOSCOPY;  Service: Endoscopy;  Laterality: N/A;  . GIVENS CAPSULE STUDY N/A 09/29/2016   Procedure: GIVENS CAPSULE STUDY;  Surgeon: Manus Gunning, MD;  Location: [place];  Service: Gastroenterology;  Laterality: N/A;  try to keep pt up in bedside chair as much as possible during the study.    [name] HARDWARE REMOVAL Left 06/09/2013   Procedure: HARDWARE REMOVAL;  Surgeon: Newt Minion, MD;  Location: [place];  Service: Orthopedics;  Laterality: Left;  Left Below Knee Amputation  and Removal proximal screws IM tibial nail  . INSERTION OF DIALYSIS CATHETER N/A 11/09/2016   Procedure: INSERTION OF TEMPORARY DIALYSIS CATHETER;  Surgeon: Elam Dutch, MD;  Location: [place];  Service: Vascular;  Laterality: N/A;  . IR GENERIC HISTORICAL  09/27/2016   IR US GUIDE VASC ACCESS RIGHT 09/27/2016 Aletta Edouard, MD [facility]-INTER[facility]  . IR GENERIC HISTORICAL  09/27/2016   IR FLUORO GUIDE CV LINE RIGHT 09/27/2016 Aletta Edouard, MD [facility]  . LIGATION OF ARTERIOVENOUS  FISTULA Left 11/09/2016   Procedure: LIGATION OF ARTERIOVENOUS  FISTULA;  Surgeon: Elam Dutch, MD;  Location: [place];  Service: Vascular;  Laterality: Left;  . NEPHRECTOMY     partial RR  . ORIF FIBULA FRACTURE Left 09/09/2012   Procedure: OPEN REDUCTION INTERNAL FIXATION (ORIF) FIBULA FRACTURE;  Surgeon: Johnny Bridge, MD;   Location: [place];  Service: Orthopedics;  Laterality: Left;  . TIBIA IM NAIL INSERTION Left 09/09/2012   Procedure: INTRAMEDULLARY (IM) NAIL TIBIAL;  Surgeon: Johnny Bridge, MD;  Location: [place];  Service: Orthopedics;  Laterality: Left;  left tibial nail and open reduction internal fixation left fibula fracture  . UPPER EXTREMITY VENOGRAPHY N/A 12/14/2016   Procedure: Bilateral Upper Extremity Venography and Central Venography;  Surgeon: Conrad Raven, MD;  Location: [place] CV LAB;  Service: Cardiovascular;  Laterality: N/A;    Social History   Social History  . Marital status: Married    Spouse name: N/A  . Number of children: 2  . Years of education: 16   Occupational History  . disabled due to stroke Retired   Social History Main Topics  . Smoking status: Never Smoker  . Smokeless tobacco: Never Used  . Alcohol use No  . Drug use: No  . Sexual  activity: Yes    Birth control/ protection: None   Other Topics Concern  . 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 Diabetes Mother   . Hypertension Mother   . Heart attack Father   . Hypertension Father   . Coronary artery disease Other     Current Outpatient Prescriptions  Medication Sig Dispense Refill  . acetaminophen (TYLENOL) 500 MG tablet Take 500 mg by mouth every 6 (six) hours as needed for mild pain.    [name] amiodarone (PACERONE) 200 MG tablet TAKE 1 TABLET BY MOUTH DAILY, START THIS ON 11/06/16 90 tablet 3  . atorvastatin (LIPITOR) 40 MG tablet TAKE 1 TABLET(40 MG) BY MOUTH DAILY AT 6 PM 30 tablet 4  . calcium acetate (PHOSLO) 667 MG capsule Take 1,334 mg by mouth 3 (three) times daily with meals.     . latanoprost (XALATAN) 0.005 % ophthalmic solution Place 1 drop into both eyes at bedtime.     [name] oxyCODONE-acetaminophen (ROXICET) 5-325 MG tablet Take 1 tablet by mouth every 6  (six) hours as needed for moderate pain. 10 tablet 0  . pantoprazole (PROTONIX) 40 MG tablet Take 1 tablet (40 mg total) by mouth 2 (two) times daily before a meal. 60 tablet 11  . QUEtiapine (SEROQUEL) 25 MG tablet TAKE 1 TABLET(25 MG) BY MOUTH TWICE DAILY 60 tablet 8  . SENSIPAR 30 MG tablet Take 30 mg by mouth every evening.     . silver sulfADIAZINE (SILVADENE) 1 % cream Apply 1 application topically daily as needed (irratation). 50 g   . SSD 1 % cream APPLY EXTERNALLY TO THE AFFECTED AREA DAILY 50 g 0  . warfarin (COUMADIN) 2.5 MG tablet Take 1 tablet daily except 1.5 tablets on Wed and Fri or as directed by Anticoagulation Clinic 40 tablet 3   No current facility-administered medications for this visit.      Allergies  Allergen Reactions  . No Known Allergies     REVIEW OF SYSTEMS (negative unless checked):   Cardiac:  []  Chest pain or chest pressure? []  Shortness of breath upon activity? []  Shortness of breath when lying flat? []  Irregular heart rhythm?  Vascular:  []  Pain in calf, thigh, or hip brought on by walking? []  Pain in feet at night that wakes you up from your sleep? []  Blood clot in your veins? []  Leg swelling?  Pulmonary:  []  Oxygen at home? []  Productive cough? []  Wheezing?  Neurologic:  []  Sudden weakness in arms or legs? []  Sudden numbness in arms or legs? []  Sudden onset of difficult speaking or slurred speech? []  Temporary loss of vision in one eye? []  Problems with dizziness?  Gastrointestinal:  []  Blood in stool? []  Vomited blood?  Genitourinary:  []  Burning when urinating? []  Blood in urine? [x]  end stage renal disease-HD: T/R/S  Psychiatric:  []  Major depression  Hematologic:  []  Bleeding problems? []  Problems with blood clotting?  Dermatologic:  []  Rashes or ulcers?  Constitutional:  []  Fever or chills?  Ear/Nose/Throat:  []  Change in hearing? []  Nose bleeds? []  Sore throat?  Musculoskeletal:  []  Back pain? []  Joint  pain? []  Muscle pain?  Physical Examination   Vitals:   04/09/17 1328  BP: 97/71  Pulse: 81  SpO2: 96%  Weight: 210 lb (95.3 kg)  Body mass index is 30.13 kg/m.  Pulmonary Sym exp, good air movt, CTAB, no rales, rhonchi, & wheezing  Cardiac RRR, Nl S1, S2, no Murmurs, rubs or gallops   left arm Incision is healed, skin feels warm, hand grip is 5/5, sensation in digits is intact, palpable thrill, wheezy bruit can be auscultated, prior incisions consistent with BVT, on Sonosite: brachial vein appears to be >6 mm in distal 2/3, difficult to follow vein proximally    Medical Decision Making   Oscar Castillo is a 66 y.o. year old male who presents s/p left first stage brachial vein transposition   Will schedule pt for L 2nd stage BRVT on 26 SEP 18.  If occluded at that time, will proceed with LUA AVG placement.  I also discussed possible need for revision with an interposition graft. Risk, benefits, and alternatives to access surgery were discussed.   The patient is aware the risks include but are not limited to: bleeding, infection, steal syndrome, nerve damage, ischemic monomelic neuropathy, thrombosis, failure to mature, complications related to venous hypertension, need for additional procedures, death and stroke.   The patient agrees to proceed forward with the procedure.  Thank you for allowing [place] to participate in this patient's care.   Adele Barthel, MD, FACS [facility] [place] Office: [phone] Pager: [phone]

## 2017-04-21 NOTE — Anesthesia Preprocedure Evaluation (Signed)
Anesthesia Evaluation  Patient identified by MRN, date of birth, ID band Patient awake    Reviewed: Allergy & Precautions, NPO status , Patient's Chart, lab work & pertinent test results  Airway Mallampati: II   Neck ROM: Full    Dental  (+) Edentulous Upper   Pulmonary    breath sounds clear to auscultation       Cardiovascular hypertension, + CAD and +CHF  + dysrhythmias  Rhythm:Regular Rate:Normal     Neuro/Psych  Headaches, TIACVA    GI/Hepatic PUD, Bowel prep,(+) Hepatitis -  Endo/Other  diabetes, Poorly Controlled, Type 1  Renal/GU DialysisRenal disease     Musculoskeletal   Abdominal (+) + obese,   Peds  Hematology   Anesthesia Other Findings   Reproductive/Obstetrics                             Anesthesia Physical Anesthesia Plan  ASA: IV  Anesthesia Plan: General   Post-op Pain Management:    Induction: Intravenous  PONV Risk Score and Plan: 3 and Ondansetron, Dexamethasone, Midazolam and Propofol infusion  Airway Management Planned: Oral ETT  Additional Equipment:   Intra-op Plan:   Post-operative Plan: Extubation in OR  Informed Consent: I have reviewed the patients History and Physical, chart, labs and discussed the procedure including the risks, benefits and alternatives for the proposed anesthesia with the patient or authorized representative who has indicated his/her understanding and acceptance.   Dental advisory given  Plan Discussed with: CRNA  Anesthesia Plan Comments:         Anesthesia Quick Evaluation

## 2017-04-21 NOTE — Anesthesia Procedure Notes (Signed)
Procedure Name: LMA Insertion Date/Time: 04/21/2017 7:38 AM Performed by: Mariea Clonts Pre-anesthesia Checklist: Patient identified, Emergency Drugs available, Suction available and Patient being monitored Patient Re-evaluated:Patient Re-evaluated prior to induction Oxygen Delivery Method: Circle System Utilized Preoxygenation: Pre-oxygenation with 100% oxygen Induction Type: IV induction Ventilation: Mask ventilation without difficulty LMA: LMA inserted LMA Size: 5.0 Number of attempts: 1 Airway Equipment and Method: Bite block Placement Confirmation: positive ETCO2 Tube secured with: Tape Dental Injury: Teeth and Oropharynx as per pre-operative assessment

## 2017-04-21 NOTE — Transfer of Care (Signed)
Immediate Anesthesia Transfer of Care Note  Patient: Oscar Castillo  Procedure(s) Performed: Procedure(s): LEFT 2ND STAGE BRACHIAL VEIN TRANSPOSITION (Left)  Patient Location: PACU  Anesthesia Type:General  Level of Consciousness: awake, alert  and oriented  Airway & Oxygen Therapy: Patient Spontanous Breathing and Patient connected to nasal cannula oxygen  Post-op Assessment: Report given to RN, Post -op Vital signs reviewed and stable and Patient moving all extremities X 4  Post vital signs: Reviewed and stable  Last Vitals:  Vitals:   04/21/17 0606  BP: (!) 151/86  Pulse: 76  Resp: 18  Temp: 36.9 C  SpO2: 100%    Last Pain:  Vitals:   04/21/17 0609  TempSrc:   PainSc: 2       Patients Stated Pain Goal: 1 (46/27/03 5009)  Complications: No apparent anesthesia complications

## 2017-04-21 NOTE — Interval H&P Note (Signed)
   History and Physical Update  The patient was interviewed and re-examined.  The patient's previous History and Physical has been reviewed and is unchanged from my consult.  There is no change in the plan of care: L 2nd stage BRVT vs LUA AVG.   Risk, benefits, and alternatives to access surgery were discussed.    The patient is aware the risks include but are not limited to: bleeding, infection, steal syndrome, nerve damage, ischemic monomelic neuropathy, thrombosis, failure to mature, complications related to venous hypertension, need for additional procedures, death and stroke.    The patient agrees to proceed forward with the procedure.   Adele Barthel, MD, FACS [facility] [place] Office: [phone] Pager: [phone]  04/21/2017, 7:09 AM

## 2017-04-21 NOTE — Telephone Encounter (Signed)
Sched appt 05/21/17 at 10:15. Lm on hm#.

## 2017-04-21 NOTE — Telephone Encounter (Signed)
-----   Message from Mena Goes, RN sent at 04/21/2017 11:09 AM EDT ----- Regarding: 4 weeks   ----- Message ----- From: Conrad Eden, MD Sent: 04/21/2017  11:03 AM To: [facility] Charge [address]  [place] [id number] Feb 07, 1951  PROCEDURE: left second stage brachial vein transposition (brachiobrachial arteriovenous fistula) placement with revision of fistula  Asst: Gerri Lins, [facility]   Follow-up: 4 weeks

## 2017-04-21 NOTE — Op Note (Signed)
OPERATIVE NOTE   PROCEDURE: left second stage brachial vein transposition (brachiobrachial arteriovenous fistula) placement with revision of fistula  PRE-OPERATIVE DIAGNOSIS: end stage renal disease, s/p successful first stage brachial vein transposition   POST-OPERATIVE DIAGNOSIS: same as above   SURGEON: Adele Barthel, MD  ASSISTANT(S): Gerri Lins, PAC   ANESTHESIA: general  ESTIMATED BLOOD LOSS: 50 cc  FINDING(S): 1.  Fistula: distal fistula 6 mm throughout 2.  Proximal fistula sclerotic: 1-2 mm with thickened wall 3.  Proximal medial brachial vein: 3-4 mm in diameter 4.  Easily palpable thrill in fistula after revision of the fistula to the medial brachial vein at end of case  SPECIMEN(S):  none  INDICATIONS:   Oscar Castillo is a 66 y.o. male who presents with end stage renal disease.  The patient is scheduled for left second stage brachial vein transposition.  The patient is aware the risks include but are not limited to: bleeding, infection, steal syndrome, nerve damage, ischemic monomelic neuropathy, failure to mature, and need for additional procedures.  The patient is aware of the risks of the procedure and elects to proceed forward.   DESCRIPTION: After full informed written consent was obtained from the patient, the patient was brought back to the operating room and placed supine upon the operating table.  Prior to induction, the patient received IV antibiotics.   After obtaining adequate anesthesia, the patient was then prepped and draped in the standard fashion for a left arm access procedure.  I turned my attention first to identifying the patient's brachiobrachial arteriovenous fistula.  Using SonoSite guidance, the location of this fistula was marked out on the skin.  I made an longitudinal incision over the fistula from its arterial anastomosis up to its axillary extent.  I carefully dissected the fistula away from its adjacent nerves, ligating side branches in  the process.  In this process, I found the fistula was constructed from the lateral brachial vein.  This fistula was >6 mm up to mid-segment, where it drains into the medial brachial vein.  The proximal 1/2 of the lateral brachial vein was thickened and sclerotic with 1-2 mm lumen.  I ligated the connection to the medial brachial vein to see if I could hydrodistend the proximal 1/2 of the lateral brachial vein. This proximal segment would NOT distend, so it is chronically diseased.  At this point, I gave 9000 units of Heparin which was a therapeutic bolus.  I then dissected out the proximal 1/2 of the medial brachial vein.  I could get this vein to distend up to 3-4 mm proximally.  The axillary vein only appeared to be 4 mm, so I felt that placement of an left upper arm arteriovenous graft was likely to be prone to occlusion.  Subsequently, I felt attempt at revision of the fistula to this medial brachial vein was indicated.  I marked the anterior orientation of the fistula and medial brachial vein.  I tied up the medial brachial vein in the distal 1/3.  I transected the fistula proximal to this.  I inserted a vessel cannula and distended this fistula while clamping the axillary vein.  This medial brachial vein dilated to 4 mm.  I spatulated the vein to facilitate an end-to-end anastomosis.  I tied off the fistula proximally to the sclerotic segment.  I clamped the fistula distally and transected the fistula distal to the sclerotic segment.  I sewed the fistula to the medial brachial vein with a running stitch of 7-0  Prolene, in an end-to-end configuration.  I released all clamps.  No bleeding was noted.  The proximal medial vein appeared to dilate as expected with some stenosis at the anastomosis.  The thrill in the fistula was improved.  At this point, I dissected a plane superficial to the bicipital fascia.  The fistula was secured in this new location with interrupted 3-0 Vicryl stitches tied from side  branches to the underlying fascia.  The deep subcutaneous tissue was inspected for bleeding.  Bleeding was controlled with electrocautery and placement of large pieces of thrombin and gelfoam.  I washed out the surgical site after waiting a few minutes, and there was no further bleeding.  I reapproximated the fascia with horizontal mattress stitches of 2-0 Vicryl.  The deep subcutaneous tissue was reapproximated with mattress sutures of 3-0 Vicryl to eliminate some of the dead space.  The superficial subcutaneous tissue was then reapproximated along the incision line with a running stitch of 3-0 Vicryl.  The skin was then reapproximated with a running subcuticular of 4-0 Monocryl.  The skin was then cleaned, dried, and reinforced with Dermabond.  The patient tolerated this procedure well.    COMPLICATIONS: none  CONDITION: stable   Adele Barthel, MD, [facility] [place] Office: [phone] Pager: [phone]  04/21/2017, 10:47 AM

## 2017-04-22 ENCOUNTER — Encounter (HOSPITAL_COMMUNITY): Payer: Self-pay | Admitting: Vascular Surgery

## 2017-04-22 DIAGNOSIS — N186 End stage renal disease: Secondary | ICD-10-CM | POA: Diagnosis not present

## 2017-04-22 DIAGNOSIS — I4891 Unspecified atrial fibrillation: Secondary | ICD-10-CM | POA: Diagnosis not present

## 2017-04-22 DIAGNOSIS — Z5181 Encounter for therapeutic drug level monitoring: Secondary | ICD-10-CM | POA: Diagnosis not present

## 2017-04-22 DIAGNOSIS — D631 Anemia in chronic kidney disease: Secondary | ICD-10-CM | POA: Diagnosis not present

## 2017-04-22 DIAGNOSIS — Z23 Encounter for immunization: Secondary | ICD-10-CM | POA: Diagnosis not present

## 2017-04-22 DIAGNOSIS — N2581 Secondary hyperparathyroidism of renal origin: Secondary | ICD-10-CM | POA: Diagnosis not present

## 2017-04-22 DIAGNOSIS — D689 Coagulation defect, unspecified: Secondary | ICD-10-CM | POA: Diagnosis not present

## 2017-04-23 ENCOUNTER — Telehealth: Payer: Self-pay | Admitting: *Deleted

## 2017-04-23 DIAGNOSIS — D631 Anemia in chronic kidney disease: Secondary | ICD-10-CM | POA: Diagnosis not present

## 2017-04-23 DIAGNOSIS — R2689 Other abnormalities of gait and mobility: Secondary | ICD-10-CM | POA: Diagnosis not present

## 2017-04-23 DIAGNOSIS — E1122 Type 2 diabetes mellitus with diabetic chronic kidney disease: Secondary | ICD-10-CM | POA: Diagnosis not present

## 2017-04-23 DIAGNOSIS — I5042 Chronic combined systolic (congestive) and diastolic (congestive) heart failure: Secondary | ICD-10-CM | POA: Diagnosis not present

## 2017-04-23 DIAGNOSIS — I132 Hypertensive heart and chronic kidney disease with heart failure and with stage 5 chronic kidney disease, or end stage renal disease: Secondary | ICD-10-CM | POA: Diagnosis not present

## 2017-04-23 DIAGNOSIS — N186 End stage renal disease: Secondary | ICD-10-CM | POA: Diagnosis not present

## 2017-04-23 LAB — PROTIME-INR: INR: 1.6 — AB (ref 0.9–1.1)

## 2017-04-23 NOTE — Telephone Encounter (Signed)
Received INR results from Dialysis on 04/22/2017 results  1.64  Pt had procedure on 04/21/2017 and had been holding coumadin for the procedure with Lovenox bridge . Spoke with pt's wife and she states she has been following instructions given on the coumadin visit of 04/13/2017  and he has been receiving lovenox and coumadin as instructed post procedure. Pt's wife states that he also has [place] coming in the home and they would like for his INR to be done by [facility] so he will not have to come out to the coumadin clinic  This nurse called [facility] and spoke with Nira Conn and gave order to check INR on Tuesday October 2nd and she states they will do so and call us results. Also called Mr Nodal and informed that [place] would do his INR and he will not have to come to coumadin clinic on Tuesday and he states understanding

## 2017-04-24 DIAGNOSIS — D631 Anemia in chronic kidney disease: Secondary | ICD-10-CM | POA: Diagnosis not present

## 2017-04-24 DIAGNOSIS — Z23 Encounter for immunization: Secondary | ICD-10-CM | POA: Diagnosis not present

## 2017-04-24 DIAGNOSIS — D689 Coagulation defect, unspecified: Secondary | ICD-10-CM | POA: Diagnosis not present

## 2017-04-24 DIAGNOSIS — N2581 Secondary hyperparathyroidism of renal origin: Secondary | ICD-10-CM | POA: Diagnosis not present

## 2017-04-24 DIAGNOSIS — N186 End stage renal disease: Secondary | ICD-10-CM | POA: Diagnosis not present

## 2017-04-25 DIAGNOSIS — N186 End stage renal disease: Secondary | ICD-10-CM | POA: Diagnosis not present

## 2017-04-25 DIAGNOSIS — E1122 Type 2 diabetes mellitus with diabetic chronic kidney disease: Secondary | ICD-10-CM | POA: Diagnosis not present

## 2017-04-25 DIAGNOSIS — Z992 Dependence on renal dialysis: Secondary | ICD-10-CM | POA: Diagnosis not present

## 2017-04-26 DIAGNOSIS — E1122 Type 2 diabetes mellitus with diabetic chronic kidney disease: Secondary | ICD-10-CM | POA: Diagnosis not present

## 2017-04-26 DIAGNOSIS — R2689 Other abnormalities of gait and mobility: Secondary | ICD-10-CM | POA: Diagnosis not present

## 2017-04-26 DIAGNOSIS — D631 Anemia in chronic kidney disease: Secondary | ICD-10-CM | POA: Diagnosis not present

## 2017-04-26 DIAGNOSIS — I132 Hypertensive heart and chronic kidney disease with heart failure and with stage 5 chronic kidney disease, or end stage renal disease: Secondary | ICD-10-CM | POA: Diagnosis not present

## 2017-04-26 DIAGNOSIS — N186 End stage renal disease: Secondary | ICD-10-CM | POA: Diagnosis not present

## 2017-04-26 DIAGNOSIS — I5042 Chronic combined systolic (congestive) and diastolic (congestive) heart failure: Secondary | ICD-10-CM | POA: Diagnosis not present

## 2017-04-27 ENCOUNTER — Ambulatory Visit (INDEPENDENT_AMBULATORY_CARE_PROVIDER_SITE_OTHER): Payer: Medicare Other | Admitting: Cardiology

## 2017-04-27 ENCOUNTER — Telehealth: Payer: Self-pay | Admitting: Internal Medicine

## 2017-04-27 ENCOUNTER — Other Ambulatory Visit (INDEPENDENT_AMBULATORY_CARE_PROVIDER_SITE_OTHER): Payer: Self-pay | Admitting: Orthopedic Surgery

## 2017-04-27 DIAGNOSIS — G459 Transient cerebral ischemic attack, unspecified: Secondary | ICD-10-CM | POA: Diagnosis not present

## 2017-04-27 DIAGNOSIS — Z5181 Encounter for therapeutic drug level monitoring: Secondary | ICD-10-CM

## 2017-04-27 DIAGNOSIS — N186 End stage renal disease: Secondary | ICD-10-CM | POA: Diagnosis not present

## 2017-04-27 DIAGNOSIS — E1122 Type 2 diabetes mellitus with diabetic chronic kidney disease: Secondary | ICD-10-CM | POA: Diagnosis not present

## 2017-04-27 DIAGNOSIS — I132 Hypertensive heart and chronic kidney disease with heart failure and with stage 5 chronic kidney disease, or end stage renal disease: Secondary | ICD-10-CM | POA: Diagnosis not present

## 2017-04-27 DIAGNOSIS — I5042 Chronic combined systolic (congestive) and diastolic (congestive) heart failure: Secondary | ICD-10-CM | POA: Diagnosis not present

## 2017-04-27 DIAGNOSIS — N2581 Secondary hyperparathyroidism of renal origin: Secondary | ICD-10-CM | POA: Diagnosis not present

## 2017-04-27 DIAGNOSIS — D631 Anemia in chronic kidney disease: Secondary | ICD-10-CM | POA: Diagnosis not present

## 2017-04-27 DIAGNOSIS — R2689 Other abnormalities of gait and mobility: Secondary | ICD-10-CM | POA: Diagnosis not present

## 2017-04-27 DIAGNOSIS — Z4802 Encounter for removal of sutures: Secondary | ICD-10-CM | POA: Diagnosis not present

## 2017-04-27 LAB — POCT INR: INR: 2.7

## 2017-04-27 NOTE — Telephone Encounter (Signed)
Spoke with Zigmund Daniel, [facility] nurse who will be drawing INR today.  V/O provided.  Advised this was already given to [facility] at [facility] on 9/28.   Zigmund Daniel will call results today to Coumadin Clinic for dosing instructions.

## 2017-04-27 NOTE — Telephone Encounter (Signed)
New Message     Needs orders to check his PTINR at home

## 2017-04-28 DIAGNOSIS — D631 Anemia in chronic kidney disease: Secondary | ICD-10-CM | POA: Diagnosis not present

## 2017-04-28 DIAGNOSIS — E1122 Type 2 diabetes mellitus with diabetic chronic kidney disease: Secondary | ICD-10-CM | POA: Diagnosis not present

## 2017-04-28 DIAGNOSIS — I132 Hypertensive heart and chronic kidney disease with heart failure and with stage 5 chronic kidney disease, or end stage renal disease: Secondary | ICD-10-CM | POA: Diagnosis not present

## 2017-04-28 DIAGNOSIS — R2689 Other abnormalities of gait and mobility: Secondary | ICD-10-CM | POA: Diagnosis not present

## 2017-04-28 DIAGNOSIS — N186 End stage renal disease: Secondary | ICD-10-CM | POA: Diagnosis not present

## 2017-04-28 DIAGNOSIS — I5042 Chronic combined systolic (congestive) and diastolic (congestive) heart failure: Secondary | ICD-10-CM | POA: Diagnosis not present

## 2017-04-29 ENCOUNTER — Encounter (HOSPITAL_COMMUNITY): Payer: Self-pay | Admitting: Emergency Medicine

## 2017-04-29 ENCOUNTER — Encounter (HOSPITAL_COMMUNITY)
Admission: EM | Disposition: A | Discharge status: Home Health | Payer: Self-pay | Source: Home / Self Care | Attending: Vascular Surgery

## 2017-04-29 ENCOUNTER — Emergency Department (HOSPITAL_COMMUNITY): Payer: Medicare Other | Admitting: Anesthesiology

## 2017-04-29 ENCOUNTER — Emergency Department (HOSPITAL_COMMUNITY)
Admission: EM | Admit: 2017-04-29 | Discharge: 2017-04-29 | Disposition: A | Discharge status: Home / Self Care | Payer: Medicare Other | Source: Home / Self Care | Attending: Emergency Medicine | Admitting: Emergency Medicine

## 2017-04-29 ENCOUNTER — Inpatient Hospital Stay (HOSPITAL_COMMUNITY): Payer: Medicare Other

## 2017-04-29 ENCOUNTER — Ambulatory Visit: Payer: Self-pay | Admitting: Internal Medicine

## 2017-04-29 ENCOUNTER — Telehealth: Payer: Self-pay | Admitting: *Deleted

## 2017-04-29 ENCOUNTER — Inpatient Hospital Stay (HOSPITAL_COMMUNITY)
Admission: EM | Admit: 2017-04-29 | Discharge: 2017-05-02 | DRG: 242 | Disposition: A | Discharge status: Home Health | Payer: Medicare Other | Attending: Vascular Surgery | Admitting: Vascular Surgery

## 2017-04-29 DIAGNOSIS — I4589 Other specified conduction disorders: Secondary | ICD-10-CM | POA: Diagnosis not present

## 2017-04-29 DIAGNOSIS — I472 Ventricular tachycardia: Secondary | ICD-10-CM | POA: Diagnosis not present

## 2017-04-29 DIAGNOSIS — T82838D Hemorrhage of vascular prosthetic devices, implants and grafts, subsequent encounter: Secondary | ICD-10-CM | POA: Diagnosis not present

## 2017-04-29 DIAGNOSIS — I132 Hypertensive heart and chronic kidney disease with heart failure and with stage 5 chronic kidney disease, or end stage renal disease: Secondary | ICD-10-CM

## 2017-04-29 DIAGNOSIS — D631 Anemia in chronic kidney disease: Secondary | ICD-10-CM

## 2017-04-29 DIAGNOSIS — I428 Other cardiomyopathies: Secondary | ICD-10-CM | POA: Diagnosis present

## 2017-04-29 DIAGNOSIS — Z7901 Long term (current) use of anticoagulants: Secondary | ICD-10-CM | POA: Insufficient documentation

## 2017-04-29 DIAGNOSIS — S40022A Contusion of left upper arm, initial encounter: Secondary | ICD-10-CM | POA: Diagnosis not present

## 2017-04-29 DIAGNOSIS — Z789 Other specified health status: Secondary | ICD-10-CM

## 2017-04-29 DIAGNOSIS — I442 Atrioventricular block, complete: Secondary | ICD-10-CM | POA: Diagnosis not present

## 2017-04-29 DIAGNOSIS — I480 Paroxysmal atrial fibrillation: Secondary | ICD-10-CM | POA: Diagnosis present

## 2017-04-29 DIAGNOSIS — I97638 Postprocedural hematoma of a circulatory system organ or structure following other circulatory system procedure: Secondary | ICD-10-CM | POA: Diagnosis not present

## 2017-04-29 DIAGNOSIS — T8189XA Other complications of procedures, not elsewhere classified, initial encounter: Secondary | ICD-10-CM | POA: Diagnosis not present

## 2017-04-29 DIAGNOSIS — L7632 Postprocedural hematoma of skin and subcutaneous tissue following other procedure: Secondary | ICD-10-CM | POA: Diagnosis present

## 2017-04-29 DIAGNOSIS — T8131XA Disruption of external operation (surgical) wound, not elsewhere classified, initial encounter: Secondary | ICD-10-CM | POA: Diagnosis not present

## 2017-04-29 DIAGNOSIS — N186 End stage renal disease: Secondary | ICD-10-CM | POA: Diagnosis present

## 2017-04-29 DIAGNOSIS — Z89512 Acquired absence of left leg below knee: Secondary | ICD-10-CM | POA: Diagnosis not present

## 2017-04-29 DIAGNOSIS — N189 Chronic kidney disease, unspecified: Secondary | ICD-10-CM

## 2017-04-29 DIAGNOSIS — Z452 Encounter for adjustment and management of vascular access device: Secondary | ICD-10-CM | POA: Diagnosis not present

## 2017-04-29 DIAGNOSIS — J9601 Acute respiratory failure with hypoxia: Secondary | ICD-10-CM | POA: Diagnosis not present

## 2017-04-29 DIAGNOSIS — G8918 Other acute postprocedural pain: Secondary | ICD-10-CM

## 2017-04-29 DIAGNOSIS — Z992 Dependence on renal dialysis: Secondary | ICD-10-CM | POA: Diagnosis not present

## 2017-04-29 DIAGNOSIS — I120 Hypertensive chronic kidney disease with stage 5 chronic kidney disease or end stage renal disease: Secondary | ICD-10-CM | POA: Diagnosis not present

## 2017-04-29 DIAGNOSIS — Z89511 Acquired absence of right leg below knee: Secondary | ICD-10-CM

## 2017-04-29 DIAGNOSIS — Z950 Presence of cardiac pacemaker: Secondary | ICD-10-CM | POA: Diagnosis not present

## 2017-04-29 DIAGNOSIS — I5042 Chronic combined systolic (congestive) and diastolic (congestive) heart failure: Secondary | ICD-10-CM | POA: Diagnosis present

## 2017-04-29 DIAGNOSIS — B1920 Unspecified viral hepatitis C without hepatic coma: Secondary | ICD-10-CM | POA: Diagnosis present

## 2017-04-29 DIAGNOSIS — I447 Left bundle-branch block, unspecified: Secondary | ICD-10-CM | POA: Diagnosis not present

## 2017-04-29 DIAGNOSIS — Z6830 Body mass index (BMI) 30.0-30.9, adult: Secondary | ICD-10-CM

## 2017-04-29 DIAGNOSIS — I4891 Unspecified atrial fibrillation: Secondary | ICD-10-CM | POA: Diagnosis not present

## 2017-04-29 DIAGNOSIS — T82838A Hemorrhage of vascular prosthetic devices, implants and grafts, initial encounter: Secondary | ICD-10-CM | POA: Diagnosis present

## 2017-04-29 DIAGNOSIS — E875 Hyperkalemia: Secondary | ICD-10-CM | POA: Diagnosis not present

## 2017-04-29 DIAGNOSIS — Z978 Presence of other specified devices: Secondary | ICD-10-CM

## 2017-04-29 DIAGNOSIS — Z8673 Personal history of transient ischemic attack (TIA), and cerebral infarction without residual deficits: Secondary | ICD-10-CM

## 2017-04-29 DIAGNOSIS — N2581 Secondary hyperparathyroidism of renal origin: Secondary | ICD-10-CM | POA: Diagnosis present

## 2017-04-29 DIAGNOSIS — Z79899 Other long term (current) drug therapy: Secondary | ICD-10-CM

## 2017-04-29 DIAGNOSIS — J9600 Acute respiratory failure, unspecified whether with hypoxia or hypercapnia: Secondary | ICD-10-CM

## 2017-04-29 DIAGNOSIS — E1122 Type 2 diabetes mellitus with diabetic chronic kidney disease: Secondary | ICD-10-CM | POA: Diagnosis present

## 2017-04-29 DIAGNOSIS — R451 Restlessness and agitation: Secondary | ICD-10-CM | POA: Diagnosis not present

## 2017-04-29 DIAGNOSIS — Z833 Family history of diabetes mellitus: Secondary | ICD-10-CM | POA: Diagnosis not present

## 2017-04-29 DIAGNOSIS — Z905 Acquired absence of kidney: Secondary | ICD-10-CM

## 2017-04-29 DIAGNOSIS — F329 Major depressive disorder, single episode, unspecified: Secondary | ICD-10-CM | POA: Diagnosis present

## 2017-04-29 DIAGNOSIS — T85838D Hemorrhage due to other internal prosthetic devices, implants and grafts, subsequent encounter: Secondary | ICD-10-CM | POA: Diagnosis not present

## 2017-04-29 DIAGNOSIS — I509 Heart failure, unspecified: Secondary | ICD-10-CM | POA: Insufficient documentation

## 2017-04-29 DIAGNOSIS — I739 Peripheral vascular disease, unspecified: Secondary | ICD-10-CM

## 2017-04-29 DIAGNOSIS — D649 Anemia, unspecified: Secondary | ICD-10-CM | POA: Diagnosis present

## 2017-04-29 DIAGNOSIS — M7989 Other specified soft tissue disorders: Secondary | ICD-10-CM

## 2017-04-29 DIAGNOSIS — R748 Abnormal levels of other serum enzymes: Secondary | ICD-10-CM | POA: Diagnosis not present

## 2017-04-29 DIAGNOSIS — R55 Syncope and collapse: Secondary | ICD-10-CM | POA: Diagnosis not present

## 2017-04-29 HISTORY — PX: TEMPORARY PACEMAKER: CATH118268

## 2017-04-29 HISTORY — PX: HEMATOMA EVACUATION: SHX5118

## 2017-04-29 LAB — PROTIME-INR
INR: 2.52
INR: 2.06
PROTHROMBIN TIME: 26.9 s — AB (ref 11.4–15.2)
Prothrombin Time: 23.1 seconds — ABNORMAL HIGH (ref 11.4–15.2)

## 2017-04-29 LAB — COMPREHENSIVE METABOLIC PANEL
ALK PHOS: 62 U/L (ref 38–126)
ALT: 12 U/L — ABNORMAL LOW (ref 17–63)
ANION GAP: 15 (ref 5–15)
AST: 17 U/L (ref 15–41)
Albumin: 3.5 g/dL (ref 3.5–5.0)
BUN: 65 mg/dL — ABNORMAL HIGH (ref 6–20)
CALCIUM: 8.8 mg/dL — AB (ref 8.9–10.3)
CO2: 21 mmol/L — AB (ref 22–32)
Chloride: 98 mmol/L — ABNORMAL LOW (ref 101–111)
Creatinine, Ser: 10.01 mg/dL — ABNORMAL HIGH (ref 0.61–1.24)
GFR calc non Af Amer: 5 mL/min — ABNORMAL LOW (ref >60)
GFR, EST AFRICAN AMERICAN: 6 mL/min — AB (ref >60)
Glucose, Bld: 158 mg/dL — ABNORMAL HIGH (ref 65–99)
Potassium: 4.8 mmol/L (ref 3.5–5.1)
SODIUM: 134 mmol/L — AB (ref 135–145)
Total Bilirubin: 0.5 mg/dL (ref 0.3–1.2)
Total Protein: 7.8 g/dL (ref 6.5–8.1)

## 2017-04-29 LAB — CBC WITH DIFFERENTIAL/PLATELET
BASOS ABS: 0 10*3/uL (ref 0.0–0.1)
BASOS PCT: 0 %
Eosinophils Absolute: 0.1 10*3/uL (ref 0.0–0.7)
Eosinophils Relative: 2 %
HEMATOCRIT: 32.8 % — AB (ref 39.0–52.0)
HEMOGLOBIN: 10.6 g/dL — AB (ref 13.0–17.0)
Lymphocytes Relative: 20 %
Lymphs Abs: 1.4 10*3/uL (ref 0.7–4.0)
MCH: 28.2 pg (ref 26.0–34.0)
MCHC: 32.3 g/dL (ref 30.0–36.0)
MCV: 87.2 fL (ref 78.0–100.0)
MONOS PCT: 12 %
Monocytes Absolute: 0.9 10*3/uL (ref 0.1–1.0)
NEUTROS ABS: 4.6 10*3/uL (ref 1.7–7.7)
NEUTROS PCT: 66 %
Platelets: 249 10*3/uL (ref 150–400)
RBC: 3.76 MIL/uL — ABNORMAL LOW (ref 4.22–5.81)
RDW: 17.4 % — ABNORMAL HIGH (ref 11.5–15.5)
WBC: 7.1 10*3/uL (ref 4.0–10.5)

## 2017-04-29 LAB — I-STAT TROPONIN, ED: Troponin i, poc: 0 ng/mL (ref 0.00–0.08)

## 2017-04-29 LAB — BLOOD GAS, ARTERIAL
ACID-BASE DEFICIT: 5.3 mmol/L — AB (ref 0.0–2.0)
BICARBONATE: 19 mmol/L — AB (ref 20.0–28.0)
Drawn by: 22766
FIO2: 100
MECHVT: 600 mL
O2 Saturation: 97.8 %
PATIENT TEMPERATURE: 98.6
PCO2 ART: 33 mmHg (ref 32.0–48.0)
PEEP/CPAP: 5 cmH2O
PH ART: 7.377 (ref 7.350–7.450)
PO2 ART: 421 mmHg — AB (ref 83.0–108.0)
RATE: 20 resp/min

## 2017-04-29 LAB — BASIC METABOLIC PANEL
ANION GAP: 13 (ref 5–15)
ANION GAP: 15 (ref 5–15)
BUN: 70 mg/dL — ABNORMAL HIGH (ref 6–20)
BUN: 74 mg/dL — ABNORMAL HIGH (ref 6–20)
CALCIUM: 10.6 mg/dL — AB (ref 8.9–10.3)
CHLORIDE: 100 mmol/L — AB (ref 101–111)
CO2: 20 mmol/L — ABNORMAL LOW (ref 22–32)
CO2: 20 mmol/L — AB (ref 22–32)
CREATININE: 10.35 mg/dL — AB (ref 0.61–1.24)
Calcium: 9.1 mg/dL (ref 8.9–10.3)
Chloride: 99 mmol/L — ABNORMAL LOW (ref 101–111)
Creatinine, Ser: 10.53 mg/dL — ABNORMAL HIGH (ref 0.61–1.24)
GFR calc non Af Amer: 5 mL/min — ABNORMAL LOW (ref >60)
GFR calc non Af Amer: 4 mL/min — ABNORMAL LOW (ref >60)
GFR, EST AFRICAN AMERICAN: 5 mL/min — AB (ref >60)
GFR, EST AFRICAN AMERICAN: 5 mL/min — AB (ref >60)
Glucose, Bld: 234 mg/dL — ABNORMAL HIGH (ref 65–99)
Glucose, Bld: 172 mg/dL — ABNORMAL HIGH (ref 65–99)
Potassium: 5 mmol/L (ref 3.5–5.1)
Potassium: 5.2 mmol/L — ABNORMAL HIGH (ref 3.5–5.1)
Sodium: 132 mmol/L — ABNORMAL LOW (ref 135–145)
Sodium: 135 mmol/L (ref 135–145)

## 2017-04-29 LAB — GLUCOSE, CAPILLARY: GLUCOSE-CAPILLARY: 185 mg/dL — AB (ref 65–99)

## 2017-04-29 LAB — TYPE AND SCREEN
ABO/RH(D): O POS
ANTIBODY SCREEN: NEGATIVE

## 2017-04-29 LAB — TROPONIN I: TROPONIN I: 0.12 ng/mL — AB (ref <0.03)

## 2017-04-29 LAB — I-STAT CG4 LACTIC ACID, ED
LACTIC ACID, VENOUS: 2.13 mmol/L — AB (ref 0.5–1.9)
Lactic Acid, Venous: 1.35 mmol/L (ref 0.5–1.9)

## 2017-04-29 LAB — TRIGLYCERIDES: Triglycerides: 78 mg/dL (ref <150)

## 2017-04-29 SURGERY — EVACUATION HEMATOMA
Anesthesia: General | Site: Arm Upper | Laterality: Left

## 2017-04-29 SURGERY — TEMPORARY PACEMAKER
Anesthesia: LOCAL

## 2017-04-29 MED ORDER — SODIUM CHLORIDE 0.9 % IV SOLN
INTRAVENOUS | Status: DC
  Administered 2017-04-29: 21:00:00 via INTRAVENOUS

## 2017-04-29 MED ORDER — LIDOCAINE HCL 2 % IJ SOLN
INTRAMUSCULAR | Status: AC
  Filled 2017-04-29: qty 10

## 2017-04-29 MED ORDER — LIDOCAINE 2% (20 MG/ML) 5 ML SYRINGE
INTRAMUSCULAR | Status: DC | PRN
  Administered 2017-04-29: 60 mg via INTRAVENOUS

## 2017-04-29 MED ORDER — METOPROLOL TARTRATE 5 MG/5ML IV SOLN: 2.0000 mg | INTRAVENOUS | Status: DC | PRN

## 2017-04-29 MED ORDER — ACETAMINOPHEN 500 MG PO TABS: 500.0000 mg | ORAL_TABLET | Freq: Four times a day (QID) | ORAL | Status: DC | PRN

## 2017-04-29 MED ORDER — INSULIN ASPART 100 UNIT/ML ~~LOC~~ SOLN
0.0000 [IU] | Freq: Three times a day (TID) | SUBCUTANEOUS | Status: DC
  Administered 2017-05-01 – 2017-05-02 (×2): 2 [IU] via SUBCUTANEOUS
  Filled 2017-04-29: qty 0.09

## 2017-04-29 MED ORDER — LABETALOL HCL 5 MG/ML IV SOLN: 10.0000 mg | INTRAVENOUS | Status: DC | PRN

## 2017-04-29 MED ORDER — SODIUM CHLORIDE 0.9 % IV SOLN
INTRAVENOUS | Status: AC | PRN
  Administered 2017-04-29: 10 mL/h via INTRAVENOUS
  Administered 2017-04-29: 250 mL

## 2017-04-29 MED ORDER — PANTOPRAZOLE SODIUM 40 MG PO TBEC: 40.0000 mg | DELAYED_RELEASE_TABLET | Freq: Two times a day (BID) | ORAL | Status: DC

## 2017-04-29 MED ORDER — LIDOCAINE HCL 1 % IJ SOLN
INTRAMUSCULAR | Status: AC
  Filled 2017-04-29: qty 20

## 2017-04-29 MED ORDER — CALCIUM ACETATE (PHOS BINDER) 667 MG PO CAPS
1334.0000 mg | ORAL_CAPSULE | Freq: Three times a day (TID) | ORAL | Status: DC
  Administered 2017-05-01 – 2017-05-02 (×3): 1334 mg via ORAL
  Filled 2017-04-29 (×4): qty 2

## 2017-04-29 MED ORDER — SODIUM CHLORIDE 0.9 % IV SOLN
INTRAVENOUS | Status: DC | PRN
  Administered 2017-04-29 (×2): via INTRAVENOUS

## 2017-04-29 MED ORDER — FENTANYL CITRATE (PF) 100 MCG/2ML IJ SOLN
INTRAMUSCULAR | Status: DC | PRN
  Administered 2017-04-29: 50 ug via INTRAVENOUS

## 2017-04-29 MED ORDER — OXYCODONE-ACETAMINOPHEN 5-325 MG PO TABS: 1.0000 | ORAL_TABLET | ORAL | 0 refills | Status: DC | PRN

## 2017-04-29 MED ORDER — HYDRALAZINE HCL 20 MG/ML IJ SOLN: 5.0000 mg | INTRAMUSCULAR | Status: DC | PRN

## 2017-04-29 MED ORDER — POLYETHYLENE GLYCOL 3350 17 G PO PACK: 17.0000 g | PACK | Freq: Every day | ORAL | Status: DC | PRN

## 2017-04-29 MED ORDER — HEPARIN (PORCINE) IN NACL 2-0.9 UNIT/ML-% IJ SOLN
INTRAMUSCULAR | Status: AC | PRN
  Administered 2017-04-29: 500 mL

## 2017-04-29 MED ORDER — DEXAMETHASONE SODIUM PHOSPHATE 10 MG/ML IJ SOLN
INTRAMUSCULAR | Status: DC | PRN
  Administered 2017-04-29: 10 mg via INTRAVENOUS

## 2017-04-29 MED ORDER — MIDAZOLAM HCL 2 MG/2ML IJ SOLN
2.0000 mg | Freq: Once | INTRAMUSCULAR | Status: AC
  Administered 2017-04-29: 2 mg via INTRAVENOUS

## 2017-04-29 MED ORDER — CHLORHEXIDINE GLUCONATE 0.12% ORAL RINSE (MEDLINE KIT)
15.0000 mL | Freq: Two times a day (BID) | OROMUCOSAL | Status: DC
  Administered 2017-04-29 – 2017-04-30 (×2): 15 mL via OROMUCOSAL

## 2017-04-29 MED ORDER — SODIUM CHLORIDE 0.9% FLUSH: 3.0000 mL | INTRAVENOUS | Status: DC | PRN

## 2017-04-29 MED ORDER — AMIODARONE LOAD VIA INFUSION
150.0000 mg | Freq: Once | INTRAVENOUS | Status: DC
  Filled 2017-04-29: qty 83.34

## 2017-04-29 MED ORDER — 0.9 % SODIUM CHLORIDE (POUR BTL) OPTIME
TOPICAL | Status: DC | PRN
  Administered 2017-04-29: 1000 mL

## 2017-04-29 MED ORDER — SODIUM CHLORIDE 0.9 % IV SOLN
Freq: Once | INTRAVENOUS | Status: DC
  Filled 2017-04-29: qty 1

## 2017-04-29 MED ORDER — LATANOPROST 0.005 % OP SOLN
1.0000 [drp] | Freq: Every day | OPHTHALMIC | Status: DC
  Administered 2017-04-30: 1 [drp] via OPHTHALMIC
  Filled 2017-04-29: qty 2.5

## 2017-04-29 MED ORDER — MIDAZOLAM HCL 2 MG/2ML IJ SOLN
INTRAMUSCULAR | Status: DC | PRN
  Administered 2017-04-29: 2 mg via INTRAVENOUS
  Administered 2017-04-29: 3 mg via INTRAVENOUS

## 2017-04-29 MED ORDER — PROPOFOL 10 MG/ML IV BOLUS
INTRAVENOUS | Status: DC | PRN
  Administered 2017-04-29: 100 mg via INTRAVENOUS
  Administered 2017-04-29: 50 mg via INTRAVENOUS

## 2017-04-29 MED ORDER — SODIUM CHLORIDE 0.9% FLUSH
3.0000 mL | Freq: Two times a day (BID) | INTRAVENOUS | Status: DC
  Administered 2017-04-30 – 2017-05-02 (×5): 3 mL via INTRAVENOUS

## 2017-04-29 MED ORDER — AMIODARONE HCL 150 MG/3ML IV SOLN
INTRAVENOUS | Status: AC
  Filled 2017-04-29: qty 3

## 2017-04-29 MED ORDER — SODIUM CHLORIDE 0.9 % IV SOLN: INTRAVENOUS | Status: AC

## 2017-04-29 MED ORDER — ESMOLOL HCL 100 MG/10ML IV SOLN
INTRAVENOUS | Status: DC | PRN
  Administered 2017-04-29: 20 mg via INTRAVENOUS

## 2017-04-29 MED ORDER — AMIODARONE HCL IN DEXTROSE 360-4.14 MG/200ML-% IV SOLN
60.0000 mg/h | INTRAVENOUS | Status: DC
  Administered 2017-04-29 (×2): 60 mg/h via INTRAVENOUS
  Filled 2017-04-29: qty 200

## 2017-04-29 MED ORDER — SENNA 8.6 MG PO TABS
1.0000 | ORAL_TABLET | Freq: Two times a day (BID) | ORAL | Status: DC
  Administered 2017-04-30 – 2017-05-02 (×4): 8.6 mg via ORAL
  Filled 2017-04-29 (×5): qty 1

## 2017-04-29 MED ORDER — AMIODARONE HCL IN DEXTROSE 360-4.14 MG/200ML-% IV SOLN
30.0000 mg/h | INTRAVENOUS | Status: DC
  Administered 2017-04-30: 30 mg/h via INTRAVENOUS
  Filled 2017-04-29: qty 200

## 2017-04-29 MED ORDER — GUAIFENESIN-DM 100-10 MG/5ML PO SYRP: 15.0000 mL | ORAL_SOLUTION | ORAL | Status: DC | PRN

## 2017-04-29 MED ORDER — MIDAZOLAM HCL 2 MG/2ML IJ SOLN
INTRAMUSCULAR | Status: AC
  Filled 2017-04-29: qty 2

## 2017-04-29 MED ORDER — SUCCINYLCHOLINE CHLORIDE 200 MG/10ML IV SOSY
PREFILLED_SYRINGE | INTRAVENOUS | Status: DC | PRN
  Administered 2017-04-29: 100 mg via INTRAVENOUS

## 2017-04-29 MED ORDER — PANTOPRAZOLE SODIUM 40 MG PO TBEC
40.0000 mg | DELAYED_RELEASE_TABLET | Freq: Every day | ORAL | Status: DC
  Administered 2017-04-30 – 2017-05-02 (×3): 40 mg via ORAL
  Filled 2017-04-29 (×3): qty 1

## 2017-04-29 MED ORDER — QUETIAPINE FUMARATE 25 MG PO TABS: 25.0000 mg | ORAL_TABLET | Freq: Two times a day (BID) | ORAL | Status: DC

## 2017-04-29 MED ORDER — AMIODARONE HCL IN DEXTROSE 360-4.14 MG/200ML-% IV SOLN
INTRAVENOUS | Status: AC
  Filled 2017-04-29: qty 200

## 2017-04-29 MED ORDER — BISACODYL 10 MG RE SUPP: 10.0000 mg | Freq: Every day | RECTAL | Status: DC | PRN

## 2017-04-29 MED ORDER — ATORVASTATIN CALCIUM 40 MG PO TABS
40.0000 mg | ORAL_TABLET | Freq: Every day | ORAL | Status: DC
  Administered 2017-05-01: 40 mg via ORAL
  Filled 2017-04-29: qty 1

## 2017-04-29 MED ORDER — CEFAZOLIN SODIUM-DEXTROSE 1-4 GM/50ML-% IV SOLN
1.0000 g | INTRAVENOUS | Status: AC
  Administered 2017-04-29: 1 g via INTRAVENOUS

## 2017-04-29 MED ORDER — HEPARIN SODIUM (PORCINE) 1000 UNIT/ML IJ SOLN
1900.0000 [IU] | Freq: Once | INTRAMUSCULAR | Status: AC
  Administered 2017-04-29: 1900 [IU]

## 2017-04-29 MED ORDER — FENTANYL CITRATE (PF) 100 MCG/2ML IJ SOLN
INTRAMUSCULAR | Status: AC
  Filled 2017-04-29: qty 2

## 2017-04-29 MED ORDER — PHENOL 1.4 % MT LIQD: 1.0000 | OROMUCOSAL | Status: DC | PRN

## 2017-04-29 MED ORDER — FENTANYL CITRATE (PF) 100 MCG/2ML IJ SOLN: 25.0000 ug | INTRAMUSCULAR | Status: DC | PRN

## 2017-04-29 MED ORDER — AMIODARONE LOAD VIA INFUSION
INTRAVENOUS | Status: DC | PRN
  Administered 2017-04-29: 150 mg via INTRAVENOUS

## 2017-04-29 MED ORDER — METOPROLOL TARTRATE 5 MG/5ML IV SOLN
INTRAVENOUS | Status: AC
  Filled 2017-04-29: qty 5

## 2017-04-29 MED ORDER — ONDANSETRON HCL 4 MG/2ML IJ SOLN: 4.0000 mg | Freq: Four times a day (QID) | INTRAMUSCULAR | Status: DC | PRN

## 2017-04-29 MED ORDER — LIDOCAINE HCL (PF) 1 % IJ SOLN
INTRAMUSCULAR | Status: DC | PRN
  Administered 2017-04-29: 20 mL

## 2017-04-29 MED ORDER — ATROPINE SULFATE 0.4 MG/ML IJ SOLN
INTRAMUSCULAR | Status: DC | PRN
  Administered 2017-04-29: 1.2 mg via INTRAVENOUS
  Administered 2017-04-29: 0.4 mg via INTRAVENOUS
  Administered 2017-04-29: .2 mg via INTRAVENOUS

## 2017-04-29 MED ORDER — ONDANSETRON HCL 4 MG/2ML IJ SOLN
INTRAMUSCULAR | Status: DC | PRN
  Administered 2017-04-29: 4 mg via INTRAVENOUS

## 2017-04-29 MED ORDER — METOPROLOL TARTRATE 5 MG/5ML IV SOLN
INTRAVENOUS | Status: DC | PRN
  Administered 2017-04-29: 2.5 mg via INTRAVENOUS

## 2017-04-29 MED ORDER — AMIODARONE HCL 200 MG PO TABS: 200.0000 mg | ORAL_TABLET | Freq: Every day | ORAL | Status: DC

## 2017-04-29 MED ORDER — ACETAMINOPHEN 325 MG PO TABS: 650.0000 mg | ORAL_TABLET | ORAL | Status: DC | PRN

## 2017-04-29 MED ORDER — SODIUM CHLORIDE 0.9 % IV SOLN
10.0000 mL/h | Freq: Once | INTRAVENOUS | Status: AC
  Administered 2017-04-29: 10 mL/h via INTRAVENOUS

## 2017-04-29 MED ORDER — CEFAZOLIN SODIUM-DEXTROSE 1-4 GM/50ML-% IV SOLN
INTRAVENOUS | Status: AC
  Filled 2017-04-29: qty 50

## 2017-04-29 MED ORDER — SODIUM CHLORIDE 0.9 % IR SOLN
Status: DC | PRN
  Administered 2017-04-29: 1000 mL

## 2017-04-29 MED ORDER — MIDAZOLAM HCL 2 MG/2ML IJ SOLN
INTRAMUSCULAR | Status: AC
  Administered 2017-04-29: 2 mg via INTRAVENOUS
  Filled 2017-04-29: qty 2

## 2017-04-29 MED ORDER — DEXTROSE 5 % IV SOLN
1.5000 g | Freq: Two times a day (BID) | INTRAVENOUS | Status: DC
  Administered 2017-04-29 – 2017-04-30 (×2): 1.5 g via INTRAVENOUS
  Filled 2017-04-29 (×2): qty 1.5

## 2017-04-29 MED ORDER — PANTOPRAZOLE SODIUM 40 MG PO TBEC: 40.0000 mg | DELAYED_RELEASE_TABLET | Freq: Every day | ORAL | Status: DC

## 2017-04-29 MED ORDER — FLEET ENEMA 7-19 GM/118ML RE ENEM
1.0000 | ENEMA | Freq: Once | RECTAL | Status: DC | PRN
  Filled 2017-04-29: qty 1

## 2017-04-29 MED ORDER — FAMOTIDINE IN NACL 20-0.9 MG/50ML-% IV SOLN
20.0000 mg | INTRAVENOUS | Status: DC
  Administered 2017-05-01: 20 mg via INTRAVENOUS
  Filled 2017-04-29 (×2): qty 50

## 2017-04-29 MED ORDER — ALUM & MAG HYDROXIDE-SIMETH 200-200-20 MG/5ML PO SUSP: 15.0000 mL | ORAL | Status: DC | PRN

## 2017-04-29 MED ORDER — PROPOFOL 1000 MG/100ML IV EMUL
5.0000 ug/kg/min | INTRAVENOUS | Status: DC
  Administered 2017-04-29: 5 ug/kg/min via INTRAVENOUS
  Administered 2017-04-30: 10 ug/kg/min via INTRAVENOUS
  Filled 2017-04-29 (×2): qty 100

## 2017-04-29 MED ORDER — PHENYLEPHRINE 40 MCG/ML (10ML) SYRINGE FOR IV PUSH (FOR BLOOD PRESSURE SUPPORT)
PREFILLED_SYRINGE | INTRAVENOUS | Status: DC | PRN
  Administered 2017-04-29: 160 ug via INTRAVENOUS
  Administered 2017-04-29: 120 ug via INTRAVENOUS

## 2017-04-29 MED ORDER — PROMETHAZINE HCL 25 MG/ML IJ SOLN: 6.2500 mg | INTRAMUSCULAR | Status: DC | PRN

## 2017-04-29 MED ORDER — CINACALCET HCL 30 MG PO TABS
30.0000 mg | ORAL_TABLET | Freq: Every evening | ORAL | Status: DC
  Administered 2017-05-01: 30 mg via ORAL
  Filled 2017-04-29: qty 1

## 2017-04-29 MED ORDER — ORAL CARE MOUTH RINSE
15.0000 mL | OROMUCOSAL | Status: DC
  Administered 2017-04-29 – 2017-04-30 (×7): 15 mL via OROMUCOSAL

## 2017-04-29 MED ORDER — MORPHINE SULFATE (PF) 4 MG/ML IV SOLN: 2.0000 mg | INTRAVENOUS | Status: DC | PRN

## 2017-04-29 MED ORDER — SODIUM CHLORIDE 0.9 % IV SOLN: 250.0000 mL | INTRAVENOUS | Status: DC | PRN

## 2017-04-29 MED ORDER — HEPARIN (PORCINE) IN NACL 2-0.9 UNIT/ML-% IJ SOLN
INTRAMUSCULAR | Status: AC
  Filled 2017-04-29: qty 500

## 2017-04-29 MED ORDER — HEPARIN SODIUM (PORCINE) 5000 UNIT/ML IJ SOLN
INTRAMUSCULAR | Status: DC | PRN
  Administered 2017-04-29: 14:00:00

## 2017-04-29 MED ORDER — HEPARIN SODIUM (PORCINE) 5000 UNIT/ML IJ SOLN
5000.0000 [IU] | Freq: Three times a day (TID) | INTRAMUSCULAR | Status: DC
  Administered 2017-04-29 – 2017-04-30 (×2): 5000 [IU] via SUBCUTANEOUS
  Filled 2017-04-29 (×2): qty 1

## 2017-04-29 MED ORDER — PHENYLEPHRINE HCL 10 MG/ML IJ SOLN
INTRAVENOUS | Status: DC | PRN
  Administered 2017-04-29: 50 ug/min via INTRAVENOUS

## 2017-04-29 MED ORDER — EPINEPHRINE PF 1 MG/10ML IJ SOSY
PREFILLED_SYRINGE | INTRAMUSCULAR | Status: DC | PRN
  Administered 2017-04-29: .8 mg via INTRAVENOUS
  Administered 2017-04-29: 0.1 mg via INTRAVENOUS
  Administered 2017-04-29: .9 mg via INTRAVENOUS
  Administered 2017-04-29: 0.2 mg via INTRAVENOUS

## 2017-04-29 MED ORDER — OXYCODONE HCL 5 MG PO TABS
5.0000 mg | ORAL_TABLET | Freq: Four times a day (QID) | ORAL | Status: DC | PRN
  Administered 2017-04-30 – 2017-05-01 (×4): 10 mg via ORAL
  Filled 2017-04-29 (×3): qty 2

## 2017-04-29 MED ORDER — MORPHINE SULFATE (PF) 4 MG/ML IV SOLN
4.0000 mg | Freq: Once | INTRAVENOUS | Status: AC
  Administered 2017-04-29: 4 mg via INTRAVENOUS
  Filled 2017-04-29: qty 1

## 2017-04-29 MED ORDER — MIDAZOLAM HCL 2 MG/2ML IJ SOLN
INTRAMUSCULAR | Status: DC | PRN
  Administered 2017-04-29 (×2): 1 mg via INTRAVENOUS

## 2017-04-29 MED FILL — Medication: Qty: 1 | Status: AC

## 2017-04-29 SURGICAL SUPPLY — 52 items
BANDAGE ESMARK 6X9 LF (GAUZE/BANDAGES/DRESSINGS) IMPLANT
BNDG CMPR 9X6 STRL LF SNTH (GAUZE/BANDAGES/DRESSINGS)
BNDG ESMARK 6X9 LF (GAUZE/BANDAGES/DRESSINGS)
BNDG GAUZE ELAST 4 BULKY (GAUZE/BANDAGES/DRESSINGS) ×2 IMPLANT
CANISTER SUCT 3000ML PPV (MISCELLANEOUS) ×3 IMPLANT
CLIP TI MEDIUM 6 (CLIP) ×2 IMPLANT
CLIP TI WIDE RED SMALL 6 (CLIP) ×2 IMPLANT
CUFF TOURNIQUET SINGLE 18IN (TOURNIQUET CUFF) IMPLANT
CUFF TOURNIQUET SINGLE 24IN (TOURNIQUET CUFF) IMPLANT
CUFF TOURNIQUET SINGLE 34IN LL (TOURNIQUET CUFF) IMPLANT
CUFF TOURNIQUET SINGLE 44IN (TOURNIQUET CUFF) IMPLANT
DRAIN CHANNEL 15F RND FF W/TCR (WOUND CARE) ×2 IMPLANT
DRSG PAD ABDOMINAL 8X10 ST (GAUZE/BANDAGES/DRESSINGS) ×2 IMPLANT
ELECT REM PT RETURN 9FT ADLT (ELECTROSURGICAL) ×3
ELECTRODE REM PT RTRN 9FT ADLT (ELECTROSURGICAL) ×1 IMPLANT
EVACUATOR SILICONE 100CC (DRAIN) ×2 IMPLANT
GAUZE SPONGE 4X4 12PLY STRL (GAUZE/BANDAGES/DRESSINGS) ×2 IMPLANT
GLOVE BIO SURGEON STRL SZ7 (GLOVE) ×3 IMPLANT
GLOVE BIOGEL PI IND STRL 6.5 (GLOVE) IMPLANT
GLOVE BIOGEL PI IND STRL 7.5 (GLOVE) ×1 IMPLANT
GLOVE BIOGEL PI INDICATOR 6.5 (GLOVE) ×4
GLOVE BIOGEL PI INDICATOR 7.5 (GLOVE) ×2
GLOVE ECLIPSE 6.5 STRL STRAW (GLOVE) ×2 IMPLANT
GOWN STRL REUS W/ TWL LRG LVL3 (GOWN DISPOSABLE) ×3 IMPLANT
GOWN STRL REUS W/TWL LRG LVL3 (GOWN DISPOSABLE) ×9
KIT BASIN OR (CUSTOM PROCEDURE TRAY) ×3 IMPLANT
KIT ROOM TURNOVER OR (KITS) ×3 IMPLANT
NS IRRIG 1000ML POUR BTL (IV SOLUTION) ×6 IMPLANT
PACK CV ACCESS (CUSTOM PROCEDURE TRAY) ×2 IMPLANT
PACK GENERAL/GYN (CUSTOM PROCEDURE TRAY) IMPLANT
PACK PERIPHERAL VASCULAR (CUSTOM PROCEDURE TRAY) IMPLANT
PACK UNIVERSAL I (CUSTOM PROCEDURE TRAY) IMPLANT
PAD ARMBOARD 7.5X6 YLW CONV (MISCELLANEOUS) ×6 IMPLANT
PENCIL BUTTON HOLSTER BLD 10FT (ELECTRODE) ×2 IMPLANT
PULSAVAC PLUS IRRIG FAN TIP (DISPOSABLE) ×3
SPONGE LAP 18X18 X RAY DECT (DISPOSABLE) ×2 IMPLANT
STAPLER VISISTAT 35W (STAPLE) ×2 IMPLANT
SUT ETHILON 2 0 PSLX (SUTURE) ×6 IMPLANT
SUT MNCRL AB 4-0 PS2 18 (SUTURE) IMPLANT
SUT PROLENE 5 0 C 1 24 (SUTURE) IMPLANT
SUT PROLENE 6 0 BV (SUTURE) ×2 IMPLANT
SUT SILK 2 0 FS (SUTURE) ×2 IMPLANT
SUT VIC AB 2-0 CT1 27 (SUTURE)
SUT VIC AB 2-0 CT1 36 (SUTURE) ×4 IMPLANT
SUT VIC AB 2-0 CT1 TAPERPNT 27 (SUTURE) IMPLANT
SUT VIC AB 3-0 SH 27 (SUTURE) ×3
SUT VIC AB 3-0 SH 27X BRD (SUTURE) IMPLANT
TAPE CLOTH SURG 4X10 WHT LF (GAUZE/BANDAGES/DRESSINGS) ×2 IMPLANT
TIP FAN IRRIG PULSAVAC PLUS (DISPOSABLE) IMPLANT
TRAY FOLEY W/METER SILVER 16FR (SET/KITS/TRAYS/PACK) IMPLANT
UNDERPAD 30X30 (UNDERPADS AND DIAPERS) ×3 IMPLANT
WATER STERILE IRR 1000ML POUR (IV SOLUTION) ×3 IMPLANT

## 2017-04-29 SURGICAL SUPPLY — 6 items
CATH S G BIP PACING (SET/KITS/TRAYS/PACK) ×1 IMPLANT
ELECT DEFIB PAD ADLT CADENCE (PAD) ×1 IMPLANT
HOVERMATT SINGLE USE (MISCELLANEOUS) ×1 IMPLANT
PACK CARDIAC CATHETERIZATION (CUSTOM PROCEDURE TRAY) ×1 IMPLANT
SHEATH PINNACLE 6F 10CM (SHEATH) ×1 IMPLANT
SLEEVE REPOSITIONING LENGTH 30 (MISCELLANEOUS) ×1 IMPLANT

## 2017-04-29 NOTE — Consult Note (Signed)
Cardiology Consultation:   Patient ID: Oscar Castillo; [id number]; September 25, 1950   Admit date: 04/29/2017 Date of Consult: 04/29/2017  Primary Care Provider: [name], MD Primary Cardiologist: Harrington Challenger    Patient Profile:   Oscar Castillo is a 66 y.o. male with a hx of Mild nonischemic cardiomyopathy, paroxysmal atrial fibrillation, incisional disease on dialysis, who is being seen today for the evaluation of complete heart block following vascular surgery at the request of Dr. Bridgett Larsson.  History of Present Illness:   [name] underwent a second stage basilic vein transposition by Dr. Vallarie Mare roughly a week ago. He then developed a large hematoma at the surgical site roughly 3 days after surgery. He was admitted today for evacuation of a large hematoma. He was therapeutically anticoagulated with warfarin with an INR of 2.5 early this morning. Since then he has received 3 units of FFP.  Reportedly during surgery he developed transient complete heart block, spontaneous resolved. Immediately postoperatively, following extubation he again developed complete heart block which was this time prolonged. ACLS resuscitation was followed and he developed ventricular tachycardia for which he was shocked. He received intravenous atropine and epinephrine. Transcutaneous pacing was then applied for persistent heart block.  When I first saw him the rhythm was idioventricular escape at roughly 44 bpm with clear-cut A-V dissociation. Following the administration of intravenous calcium chloride, 1 mg of epinephrine and intravenous sodium bicarbonates the patient regained normal AV conduction, with wide QRS complex due to pre-existing left bundle branch block. However, he subsequently developed intermittent pauses with ventricular escape beats again.  The patient is now intubated, but shows spontaneous movements of his limbs and was opening his eyes.  Past medical history includes chronic combined systolic and diastolic  heart failure with an ejection fraction estimated at 45-50 percent. This is presumed to be nonischemic with a nuclear stress is that shows an LBBB related artifact, but no reversible ischemia. He has a history of paroxysmal atrial fibrillation and had a stroke in 2009. He has previous undergone bilateral below the knee amputation. He has been on chronic hemodialysis since 2013. He is currently receiving dialysis through a temporary left subclavian catheter. He has a background of type 2 diabetes mellitus and hypertension. Other medical problems include bipolar disorder, history of small gastric antral ulcer, anemia, chronic lumbar pain, cholelithiasis, history of C. difficile  Past Medical History:  Diagnosis Date  . Anemia   . Antral ulcer 2014   small  . Atrial fibrillation ([place])   . Atrial fibrillation with RVR ([place]) 10/07/2016  . BACK PAIN, LUMBAR, CHRONIC   . BENIGN PROSTATIC HYPERTROPHY   . Bipolar disorder ([place])    "sometimes" (10/07/2016)  . CHOLELITHIASIS   . Chronic combined systolic and diastolic CHF (congestive heart failure) ([place])   . Complication of anesthesia    wife states pt had trouble waking up with in Nov., 2014  . CVA (cerebral vascular accident) ([place]) 07/2007  . DEPRESSION   . DIABETES MELLITUS, TYPE II    diet control  . ERECTILE DYSFUNCTION   . ESRD (end stage renal disease) on dialysis [facility])    "[place]; TTS" (10/07/2016)  . ESRD on hemodialysis ([place])    ESRD due to DM/HTN. Started dialysis in November 2013.  HD TTS at [facility] on [address].  [name] GERD   . GI bleed    due to gastritis, discharged 10/02/16/notes 10/07/2016  . Headache    "monthly" (10/07/2016)  . Hemorrhoids   .  Hepatitis C    C - has been treated  . History of blood transfusion    "related to dialysis" (10/07/2016)  . History of Clostridium difficile   . History of kidney stones   . HYPERTENSION   . LBBB (left bundle branch block)   . Morbid obesity ([place])   . PAF (paroxysmal atrial  fibrillation) ([place])    a. Dx 12/2015.    Past Surgical History:  Procedure Laterality Date  . AMPUTATION Left 05/12/2013   Procedure: AMPUTATION RAY;  Surgeon: Newt Minion, MD;  Location: [place];  Service: Orthopedics;  Laterality: Left;  Left Foot 1st Ray Amputation  . AMPUTATION Left 06/09/2013   Procedure: AMPUTATION BELOW KNEE;  Surgeon: Newt Minion, MD;  Location: [place];  Service: Orthopedics;  Laterality: Left;  Left Below Knee Amputation and removal proximal screws IM tibial nail  . AMPUTATION Right 09/08/2013   Procedure: AMPUTATION BELOW KNEE;  Surgeon: Newt Minion, MD;  Location: [place];  Service: Orthopedics;  Laterality: Right;  Right Below Knee Amputation  . AMPUTATION Right 10/11/2013   Procedure: AMPUTATION BELOW KNEE;  Surgeon: Newt Minion, MD;  Location: [place];  Service: Orthopedics;  Laterality: Right;  Right Below Knee Amputation Revision  . AV FISTULA PLACEMENT  06/14/2012   Procedure: ARTERIOVENOUS (AV) FISTULA CREATION;  Surgeon: Angelia Mould, MD;  Location: [facility] OR;  Service: Vascular;  Laterality: Left;  Left basilic vein transposition with fistula.  [name] BASCILIC VEIN TRANSPOSITION Left 02/17/2017   Procedure: LEFT 1ST STAGE BRACHIAL VEIN TRANSPOSITION;  Surgeon: Conrad Granite, MD;  Location: [place];  Service: Vascular;  Laterality: Left;  . BASCILIC VEIN TRANSPOSITION Left 04/21/2017   Procedure: LEFT 2ND STAGE BRACHIAL VEIN TRANSPOSITION;  Surgeon: Conrad Freeburg, MD;  Location: [place];  Service: Vascular;  Laterality: Left;  . COLONOSCOPY N/A 10/28/2012   Procedure: COLONOSCOPY;  Surgeon: Jeryl Columbia, MD;  Location: [facility] ENDOSCOPY;  Service: Endoscopy;  Laterality: N/A;  . COLONOSCOPY N/A 11/02/2012   Procedure: COLONOSCOPY;  Surgeon: Cleotis Nipper, MD;  Location: [facility] ENDOSCOPY;  Service: Endoscopy;  Laterality: N/A;  . COLONOSCOPY N/A 11/03/2012   Procedure: COLONOSCOPY;  Surgeon: Cleotis Nipper, MD;  Location: [facility] ENDOSCOPY;  Service: Endoscopy;  Laterality:  N/A;  . COLONOSCOPY N/A 09/16/2016   Procedure: COLONOSCOPY;  Surgeon: Jerene Bears, MD;  Location: [facility] ENDOSCOPY;  Service: Gastroenterology;  Laterality: N/A;  . ENTEROSCOPY N/A 11/08/2012   Procedure: ENTEROSCOPY;  Surgeon: Wonda Horner, MD;  Location: [facility] ENDOSCOPY;  Service: Endoscopy;  Laterality: N/A;  . ESOPHAGOGASTRODUODENOSCOPY N/A 11/02/2012   Procedure: ESOPHAGOGASTRODUODENOSCOPY (EGD);  Surgeon: Cleotis Nipper, MD;  Location: [facility] ENDOSCOPY;  Service: Endoscopy;  Laterality: N/A;  . ESOPHAGOGASTRODUODENOSCOPY (EGD) WITH PROPOFOL N/A 09/16/2016   Procedure: ESOPHAGOGASTRODUODENOSCOPY (EGD) WITH PROPOFOL;  Surgeon: Jerene Bears, MD;  Location: [facility] ENDOSCOPY;  Service: Gastroenterology;  Laterality: N/A;  . EXCHANGE OF A DIALYSIS CATHETER Left 11/13/2016   Procedure: EXCHANGE OF A DIALYSIS CATHETER;  Surgeon: Elam Dutch, MD;  Location: [place];  Service: Vascular;  Laterality: Left;  . EYE SURGERY Left    to remove scar tissue  . FRACTURE SURGERY    . GIVENS CAPSULE STUDY N/A 11/04/2012   Procedure: GIVENS CAPSULE STUDY;  Surgeon: Cleotis Nipper, MD;  Location: [facility] ENDOSCOPY;  Service: Endoscopy;  Laterality: N/A;  . GIVENS CAPSULE STUDY N/A 09/29/2016   Procedure: GIVENS CAPSULE STUDY;  Surgeon: Manus Gunning, MD;  Location:  [facility] ENDOSCOPY;  Service: Gastroenterology;  Laterality: N/A;  try to keep pt up in bedside chair as much as possible during the study.    [name] HARDWARE REMOVAL Left 06/09/2013   Procedure: HARDWARE REMOVAL;  Surgeon: Newt Minion, MD;  Location: [place];  Service: Orthopedics;  Laterality: Left;  Left Below Knee Amputation  and Removal proximal screws IM tibial nail  . INSERTION OF DIALYSIS CATHETER N/A 11/09/2016   Procedure: INSERTION OF TEMPORARY DIALYSIS CATHETER;  Surgeon: Elam Dutch, MD;  Location: [place];  Service: Vascular;  Laterality: N/A;  . IR GENERIC HISTORICAL  09/27/2016   IR US GUIDE VASC ACCESS RIGHT 09/27/2016 Aletta Edouard, MD [facility]  .  IR GENERIC HISTORICAL  09/27/2016   IR FLUORO GUIDE CV LINE RIGHT 09/27/2016 Aletta Edouard, MD [facility]  . LIGATION OF ARTERIOVENOUS  FISTULA Left 11/09/2016   Procedure: LIGATION OF ARTERIOVENOUS  FISTULA;  Surgeon: Elam Dutch, MD;  Location: [place];  Service: Vascular;  Laterality: Left;  . NEPHRECTOMY     partial RR  . ORIF FIBULA FRACTURE Left 09/09/2012   Procedure: OPEN REDUCTION INTERNAL FIXATION (ORIF) FIBULA FRACTURE;  Surgeon: Johnny Bridge, MD;  Location: [place];  Service: Orthopedics;  Laterality: Left;  . TIBIA IM NAIL INSERTION Left 09/09/2012   Procedure: INTRAMEDULLARY (IM) NAIL TIBIAL;  Surgeon: Johnny Bridge, MD;  Location: [place];  Service: Orthopedics;  Laterality: Left;  left tibial nail and open reduction internal fixation left fibula fracture  . UPPER EXTREMITY VENOGRAPHY N/A 12/14/2016   Procedure: Bilateral Upper Extremity Venography and Central Venography;  Surgeon: Conrad Bogue Chitto, MD;  Location: [place] CV LAB;  Service: Cardiovascular;  Laterality: N/A;     Home Medications:  Prior to Admission medications   Medication Sig Start Date End Date Taking? Authorizing Provider  acetaminophen (TYLENOL) 500 MG tablet Take 500 mg by mouth every 6 (six) hours as needed for mild pain.    [provider]  amiodarone (PACERONE) 200 MG tablet TAKE 1 TABLET BY MOUTH DAILY, START THIS ON 11/06/16 11/02/16   Cherine Drumgoole, MD  atorvastatin (LIPITOR) 40 MG tablet TAKE 1 TABLET(40 MG) BY MOUTH DAILY AT 6 PM 12/02/16   [name], MD  calcium acetate (PHOSLO) 667 MG capsule Take 1,334 mg by mouth 3 (three) times daily with meals.     [provider]  latanoprost (XALATAN) 0.005 % ophthalmic solution Place 1 drop into both eyes at bedtime.  06/12/16   [provider]  oxyCODONE-acetaminophen (ROXICET) 5-325 MG tablet Take 1 tablet by mouth every 4 (four) hours as needed for moderate pain. 09/27/72   Delora Fuel, MD  pantoprazole (PROTONIX) 40 MG tablet  Take 1 tablet (40 mg total) by mouth 2 (two) times daily before a meal. 10/14/16   [name], MD  QUEtiapine (SEROQUEL) 25 MG tablet TAKE 1 TABLET(25 MG) BY MOUTH TWICE DAILY 01/05/17   [name], MD  SENSIPAR 30 MG tablet Take 30 mg by mouth every evening.  01/09/15   [provider]  silver sulfADIAZINE (SILVADENE) 1 % cream Apply 1 application topically daily as needed (irratation). 03/01/17   Suzan Slick, NP  SSD 1 % cream APPLY EXTERNALLY TO THE AFFECTED AREA DAILY 04/27/17   Newt Minion, MD  traMADol (ULTRAM) 50 MG tablet Take 50 mg by mouth every 6 (six) hours as needed for moderate pain.    [provider]  warfarin (COUMADIN) 2.5  MG tablet Take 1 tablet daily except 1.5 tablets on Wed and Fri or as directed by Anticoagulation Clinic Patient taking differently: Take 2.5-3.75 mg by mouth See admin instructions. Take 1 tablet daily except 1.5 tablets on Friday 12/28/16   Fay Records, MD    Inpatient Medications: Scheduled Meds: . [MAR Hold] heparin 1000 unit irrigation   Irrigation Once   Continuous Infusions:  PRN Meds:   Allergies:    Allergies  Allergen Reactions  . No Known Allergies     Social History:   Social History   Social History  . Marital status: Married    Spouse name: N/A  . Number of children: 2  . Years of education: 16   Occupational History  . disabled due to stroke Retired   Social History Main Topics  . Smoking status: Never Smoker  . Smokeless tobacco: Never Used  . Alcohol use No  . Drug use: No  . Sexual activity: Yes    Birth control/ protection: None   Other Topics Concern  . 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 Diabetes Mother   . Hypertension Mother   . Heart attack Father   . Hypertension Father   . Coronary artery disease Other      ROS:    Please see the history of present illness.  ROS  Cannot be obtained due to patient factors.   Physical Exam/Data:   Vitals:   04/29/17 1054 04/29/17 1249 04/29/17 1304  BP: 102/65 115/78 112/70  Pulse: 88 77 73  Resp: 20 16 15   Temp: 98.3 F (36.8 C) 98.1 F (36.7 C) 98.1 F (36.7 C)  TempSrc: Oral Oral Oral  SpO2: 100%  100%    Intake/Output Summary (Last 24 hours) at 04/29/17 1636 Last data filed at 04/29/17 1539  Gross per 24 hour  Intake             1194 ml  Output              150 ml  Net             1044 ml   There were no vitals filed for this visit. There is no height or weight on file to calculate BMI. Unable to obtain at this time General:  Intubated, moving spontaneously. HEENT: normal Lymph: no adenopathy Neck: no JVD Endocrine:  No thryomegaly Vascular: No carotid bruits; FA pulses 2+ bilaterally without bruits  Cardiac:  normal S1, widely split S2; RRR; no diastolic murmur 1/6 aortic ejection murmur, early peaking Lungs:  clear to auscultation bilaterally, no wheezing, rhonchi or rales  Abd: soft, nontender, no hepatomegaly  Ext: no edema Musculoskeletal:  No deformities, BUE and BLE strength appear to be normal and equal Skin: warm and dry  Neuro:  Nonfocal by limited exam Psych:  Unable to evaluate   EKG:  The EKG was personally reviewed and demonstrates:  Complete heart block with ventricular escape rhythm, background sinus tachycardia Telemetry:  Telemetry was personally reviewed and demonstrates:  As above, after administration of calcium, sodium bicarbonate, epinephrine the rhythm appears to be sinus rhythm with left bundle branch block on the monitor.  Relevant CV Studies:  Echo 11/09/2016 - Left ventricle: The cavity size was normal. Systolic function was   mildly  reduced. The estimated ejection fraction was in the range   of 45% to 50%. There is severe hypokinesis of the basal-mid   anteroseptal myocardium. - Aortic valve: There was very  mild stenosis. - Mitral valve: Calcified annulus. Moderately thickened, severely   calcified leaflets posterior. Mobility of the posterior leaflet   was moderately restricted. Transvalvular velocity was minimally   increased. The findings are consistent with trivial stenosis.   There was moderate regurgitation. - Left atrium: The atrium was mildly dilated. - Right atrium: The atrium was mildly dilated. - Pulmonary arteries: Systolic pressure was mildly increased. PA   peak pressure: 34 mm Hg (S).  Nuclear stress test 12/30/2015  Downsloping ST segment depression ST segment depression was noted during stress in the II, V5 and V6 leads.  This is an intermediate risk study.  The left ventricular ejection fraction is moderately decreased (30-44%).   1. There was a fixed, medium-sized, mild mid to apical anteroseptal and inferoseptal perfusion defect.  2. EF 36% with septal wall motion abnormality. 3. Intermediate risk study.  Findings can all be explained by LBBB itself (perfusion defect due to artifact from LBBB), however cannot rule out prior infarction.  No significant ischemia.  Intermediate risk due to low EF.  Would get echo to confirm EF.    Laboratory Data:  Chemistry Recent Labs Lab 04/29/17 0308  NA 134*  K 4.8  CL 98*  CO2 21*  GLUCOSE 158*  BUN 65*  CREATININE 10.01*  CALCIUM 8.8*  GFRNONAA 5*  GFRAA 6*  ANIONGAP 15     Recent Labs Lab 04/29/17 0308  PROT 7.8  ALBUMIN 3.5  AST 17  ALT 12*  ALKPHOS 62  BILITOT 0.5   Hematology Recent Labs Lab 04/29/17 0308  WBC 7.1  RBC 3.76*  HGB 10.6*  HCT 32.8*  MCV 87.2  MCH 28.2  MCHC 32.3  RDW 17.4*  PLT 249   Cardiac EnzymesNo results for input(s): TROPONINI in the last 168 hours.  Recent Labs Lab 04/29/17 0336  TROPIPOC 0.00    BNPNo results for input(s): BNP, PROBNP in the last 168 hours.  DDimer No results for input(s): DDIMER in the last 168 hours.  Radiology/Studies:  No results  found.  Assessment and Plan:   1. Complete heart block: Based on response administration of calcium/epinephrine/sodium bicarbonate suspect that there is a metabolic etiology for this abnormality, most likely hyperkalemia. However we need to provide stable ventricular rhythm until this can be corrected. Labs have been sent but are not yet available. We'll go ahead with a temporary transvenous pacing wire from the groin, Dr. Gwenlyn Found will perform. 2. CHF: He has received 2 units of FFP and is a long-time dialysis patient, suspect that he will be volume overloaded, which may delay withdrawal of mechanical ventilation. 3. ESRD: Will contact the nephrology service to provide dialysis services. 4. PAF: Recheck INR has been sent. He has received fresh frozen plasma. He does have a history of embolic stroke and numerous other risk factors for embolism related to atrial fibrillation. Would like to restart anticoagulation as soon as this is safe. 5. LBBB: Wide QRS complex at baseline 6. PAD: With previous amputation, followed by [facility].   For questions or updates, please contact [place] Please consult [email or web address] for contact info under Cardiology/STEMI.   Signed, Sanda Klein, MD  04/29/2017 4:36 PM

## 2017-04-29 NOTE — H&P (Signed)
Brief History and Physical  History of Present Illness   Oscar Castillo is a 66 y.o. male who presents with chief complaint: bleeding from left arm.  Pt s/p transposition of L BRVT with revision of anastomosis to medial brachial vein.  Pt is on Coumadin with INR 2.5 presented earlier today with hematoma.  We discussed options for managed and patient elected conservative management.  Reportedly he went home and started bleeding from left arm.  Past Medical History:  Diagnosis Date  . Anemia   . Antral ulcer 2014   small  . Atrial fibrillation ([place])   . Atrial fibrillation with RVR ([place]) 10/07/2016  . BACK PAIN, LUMBAR, CHRONIC   . BENIGN PROSTATIC HYPERTROPHY   . Bipolar disorder ([place])    "sometimes" (10/07/2016)  . CHOLELITHIASIS   . Chronic combined systolic and diastolic CHF (congestive heart failure) ([place])   . Complication of anesthesia    wife states pt had trouble waking up with in Nov., 2014  . CVA (cerebral vascular accident) ([place]) 07/2007  . DEPRESSION   . DIABETES MELLITUS, TYPE II    diet control  . ERECTILE DYSFUNCTION   . ESRD (end stage renal disease) on dialysis [facility])    "[place]; TTS" (10/07/2016)  . ESRD on hemodialysis ([place])    ESRD due to DM/HTN. Started dialysis in November 2013.  HD TTS at [facility] on [address].  [name] GERD   . GI bleed    due to gastritis, discharged 10/02/16/notes 10/07/2016  . Headache    "monthly" (10/07/2016)  . Hemorrhoids   . Hepatitis C    C - has been treated  . History of blood transfusion    "related to dialysis" (10/07/2016)  . History of Clostridium difficile   . History of kidney stones   . HYPERTENSION   . LBBB (left bundle branch block)   . Morbid obesity ([place])   . PAF (paroxysmal atrial fibrillation) ([place])    a. Dx 12/2015.    Past Surgical History:  Procedure Laterality Date  . AMPUTATION Left 05/12/2013   Procedure: AMPUTATION RAY;  Surgeon: Newt Minion, MD;  Location: [place];  Service: Orthopedics;  Laterality:  Left;  Left Foot 1st Ray Amputation  . AMPUTATION Left 06/09/2013   Procedure: AMPUTATION BELOW KNEE;  Surgeon: Newt Minion, MD;  Location: [place];  Service: Orthopedics;  Laterality: Left;  Left Below Knee Amputation and removal proximal screws IM tibial nail  . AMPUTATION Right 09/08/2013   Procedure: AMPUTATION BELOW KNEE;  Surgeon: Newt Minion, MD;  Location: [place];  Service: Orthopedics;  Laterality: Right;  Right Below Knee Amputation  . AMPUTATION Right 10/11/2013   Procedure: AMPUTATION BELOW KNEE;  Surgeon: Newt Minion, MD;  Location: [place];  Service: Orthopedics;  Laterality: Right;  Right Below Knee Amputation Revision  . AV FISTULA PLACEMENT  06/14/2012   Procedure: ARTERIOVENOUS (AV) FISTULA CREATION;  Surgeon: Angelia Mould, MD;  Location: [facility] OR;  Service: Vascular;  Laterality: Left;  Left basilic vein transposition with fistula.  [name] BASCILIC VEIN TRANSPOSITION Left 02/17/2017   Procedure: LEFT 1ST STAGE BRACHIAL VEIN TRANSPOSITION;  Surgeon: Conrad Canute, MD;  Location: [place];  Service: Vascular;  Laterality: Left;  . BASCILIC VEIN TRANSPOSITION Left 04/21/2017   Procedure: LEFT 2ND STAGE BRACHIAL VEIN TRANSPOSITION;  Surgeon: Conrad , MD;  Location: [place];  Service: Vascular;  Laterality: Left;  . COLONOSCOPY N/A 10/28/2012   Procedure: COLONOSCOPY;  Surgeon: Jeryl Columbia, MD;  Location: [facility] ENDOSCOPY;  Service: Endoscopy;  Laterality: N/A;  . COLONOSCOPY N/A 11/02/2012   Procedure: COLONOSCOPY;  Surgeon: Cleotis Nipper, MD;  Location: [facility] ENDOSCOPY;  Service: Endoscopy;  Laterality: N/A;  . COLONOSCOPY N/A 11/03/2012   Procedure: COLONOSCOPY;  Surgeon: Cleotis Nipper, MD;  Location: [facility] ENDOSCOPY;  Service: Endoscopy;  Laterality: N/A;  . COLONOSCOPY N/A 09/16/2016   Procedure: COLONOSCOPY;  Surgeon: Jerene Bears, MD;  Location: [facility] ENDOSCOPY;  Service: Gastroenterology;  Laterality: N/A;  . ENTEROSCOPY N/A 11/08/2012   Procedure: ENTEROSCOPY;  Surgeon: Wonda Horner,  MD;  Location: [facility] ENDOSCOPY;  Service: Endoscopy;  Laterality: N/A;  . ESOPHAGOGASTRODUODENOSCOPY N/A 11/02/2012   Procedure: ESOPHAGOGASTRODUODENOSCOPY (EGD);  Surgeon: Cleotis Nipper, MD;  Location: [facility] ENDOSCOPY;  Service: Endoscopy;  Laterality: N/A;  . ESOPHAGOGASTRODUODENOSCOPY (EGD) WITH PROPOFOL N/A 09/16/2016   Procedure: ESOPHAGOGASTRODUODENOSCOPY (EGD) WITH PROPOFOL;  Surgeon: Jerene Bears, MD;  Location: [facility] ENDOSCOPY;  Service: Gastroenterology;  Laterality: N/A;  . EXCHANGE OF A DIALYSIS CATHETER Left 11/13/2016   Procedure: EXCHANGE OF A DIALYSIS CATHETER;  Surgeon: Elam Dutch, MD;  Location: [place];  Service: Vascular;  Laterality: Left;  . EYE SURGERY Left    to remove scar tissue  . FRACTURE SURGERY    . GIVENS CAPSULE STUDY N/A 11/04/2012   Procedure: GIVENS CAPSULE STUDY;  Surgeon: Cleotis Nipper, MD;  Location: [facility] ENDOSCOPY;  Service: Endoscopy;  Laterality: N/A;  . GIVENS CAPSULE STUDY N/A 09/29/2016   Procedure: GIVENS CAPSULE STUDY;  Surgeon: Manus Gunning, MD;  Location: [place];  Service: Gastroenterology;  Laterality: N/A;  try to keep pt up in bedside chair as much as possible during the study.    [name] HARDWARE REMOVAL Left 06/09/2013   Procedure: HARDWARE REMOVAL;  Surgeon: Newt Minion, MD;  Location: [place];  Service: Orthopedics;  Laterality: Left;  Left Below Knee Amputation  and Removal proximal screws IM tibial nail  . INSERTION OF DIALYSIS CATHETER N/A 11/09/2016   Procedure: INSERTION OF TEMPORARY DIALYSIS CATHETER;  Surgeon: Elam Dutch, MD;  Location: [place];  Service: Vascular;  Laterality: N/A;  . IR GENERIC HISTORICAL  09/27/2016   IR US GUIDE VASC ACCESS RIGHT 09/27/2016 Aletta Edouard, MD [facility]-INTER[facility]  . IR GENERIC HISTORICAL  09/27/2016   IR FLUORO GUIDE CV LINE RIGHT 09/27/2016 Aletta Edouard, MD [facility]  . LIGATION OF ARTERIOVENOUS  FISTULA Left 11/09/2016   Procedure: LIGATION OF ARTERIOVENOUS  FISTULA;  Surgeon: Elam Dutch, MD;   Location: [place];  Service: Vascular;  Laterality: Left;  . NEPHRECTOMY     partial RR  . ORIF FIBULA FRACTURE Left 09/09/2012   Procedure: OPEN REDUCTION INTERNAL FIXATION (ORIF) FIBULA FRACTURE;  Surgeon: Johnny Bridge, MD;  Location: [place];  Service: Orthopedics;  Laterality: Left;  . TIBIA IM NAIL INSERTION Left 09/09/2012   Procedure: INTRAMEDULLARY (IM) NAIL TIBIAL;  Surgeon: Johnny Bridge, MD;  Location: [place];  Service: Orthopedics;  Laterality: Left;  left tibial nail and open reduction internal fixation left fibula fracture  . UPPER EXTREMITY VENOGRAPHY N/A 12/14/2016   Procedure: Bilateral Upper Extremity Venography and Central Venography;  Surgeon: Conrad Wolfe, MD;  Location: [place] CV LAB;  Service: Cardiovascular;  Laterality: N/A;    Social History   Social History  . Marital status: Married    Spouse name: N/A  . Number of children: 2  . Years of education: 5  Occupational History  . disabled due to stroke Retired   Social History Main Topics  . Smoking status: Never Smoker  . Smokeless tobacco: Never Used  . Alcohol use No  . Drug use: No  . Sexual activity: Yes    Birth control/ protection: None   Other Topics Concern  . 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 Diabetes Mother   . Hypertension Mother   . Heart attack Father   . Hypertension Father   . Coronary artery disease Other     Current Facility-Administered Medications  Medication Dose Route Frequency Provider Last Rate Last Dose  . 0.9 %  sodium chloride infusion  10 mL/hr Intravenous Once Blanchie Dessert, MD      . ceFAZolin (ANCEF) IVPB 1 g/50 mL premix  1 g Intravenous On Call Gabriel Earing, PA-C       Current Outpatient Prescriptions  Medication Sig Dispense Refill  . acetaminophen (TYLENOL) 500 MG tablet Take 500 mg by mouth every 6  (six) hours as needed for mild pain.    [name] amiodarone (PACERONE) 200 MG tablet TAKE 1 TABLET BY MOUTH DAILY, START THIS ON 11/06/16 90 tablet 3  . atorvastatin (LIPITOR) 40 MG tablet TAKE 1 TABLET(40 MG) BY MOUTH DAILY AT 6 PM 30 tablet 4  . calcium acetate (PHOSLO) 667 MG capsule Take 1,334 mg by mouth 3 (three) times daily with meals.     . latanoprost (XALATAN) 0.005 % ophthalmic solution Place 1 drop into both eyes at bedtime.     [name] oxyCODONE-acetaminophen (ROXICET) 5-325 MG tablet Take 1 tablet by mouth every 4 (four) hours as needed for moderate pain. 20 tablet 0  . pantoprazole (PROTONIX) 40 MG tablet Take 1 tablet (40 mg total) by mouth 2 (two) times daily before a meal. 60 tablet 11  . QUEtiapine (SEROQUEL) 25 MG tablet TAKE 1 TABLET(25 MG) BY MOUTH TWICE DAILY 60 tablet 8  . SENSIPAR 30 MG tablet Take 30 mg by mouth every evening.     . silver sulfADIAZINE (SILVADENE) 1 % cream Apply 1 application topically daily as needed (irratation). 50 g   . SSD 1 % cream APPLY EXTERNALLY TO THE AFFECTED AREA DAILY 50 g 0  . traMADol (ULTRAM) 50 MG tablet Take 50 mg by mouth every 6 (six) hours as needed for moderate pain.    [name] warfarin (COUMADIN) 2.5 MG tablet Take 1 tablet daily except 1.5 tablets on Wed and Fri or as directed by Anticoagulation Clinic (Patient taking differently: Take 2.5-3.75 mg by mouth See admin instructions. Take 1 tablet daily except 1.5 tablets on Friday) 40 tablet 3    Allergies  Allergen Reactions  . No Known Allergies     Review of Systems: As listed above, otherwise negative.   Physical Examination   Vitals:   04/29/17 1054  BP: 102/65  Pulse: 88  Resp: 20  Temp: 98.3 F (36.8 C)  TempSrc: Oral  SpO2: 100%    General Alert, O x 3, WD, NAD  Pulmonary Sym exp, good B air movt, CTA B  Cardiac RRR, Nl S1, S2, no Murmurs, No rubs, No S3,S4  Musculo- skeletal Some separation of left arm incision with some clot at separation, left arm less swollen then  earlier today, DNVI  Neurologic Pain and light touch  intact in extremities,    Laboratory   CBC CBC Latest Ref Rng & Units 04/29/2017 04/21/2017 02/17/2017  WBC 4.0 - 10.5 K/uL 7.1 - -  Hemoglobin 13.0 - 17.0 g/dL 10.6(L) 14.6 15.0  Hematocrit 39.0 - 52.0 % 32.8(L) 43.0 44.0  Platelets 150 - 400 K/uL 249 - -    BMP BMP Latest Ref Rng & Units 04/29/2017 04/21/2017 02/17/2017  Glucose 65 - 99 mg/dL 158(H) 115(H) 95  BUN 6 - 20 mg/dL 65(H) - -  Creatinine 0.61 - 1.24 mg/dL 10.01(H) - -  Sodium 135 - 145 mmol/L 134(L) 137 132(L)  Potassium 3.5 - 5.1 mmol/L 4.8 5.0 5.5(H)  Chloride 101 - 111 mmol/L 98(L) - -  CO2 22 - 32 mmol/L 21(L) - -  Calcium 8.9 - 10.3 mg/dL 8.8(L) - -    Coagulation Lab Results  Component Value Date   INR 2.52 04/29/2017   INR 2.7 04/27/2017   INR 1.6 (A) 04/22/2017   No results found for: PTT  Lipids    Component Value Date/Time   CHOL 115 02/10/2017 1717   TRIG 48.0 02/10/2017 1717   HDL 39.80 02/10/2017 1717   CHOLHDL 3 02/10/2017 1717   VLDL 9.6 02/10/2017 1717   LDLCALC 65 02/10/2017 1717     Medical Decision Making   Oscar Castillo is a 66 y.o. male who presents with: decompressing L arm hematoma s/p L 2nd stage BRVT on coumadin.   Pt has changed his mind and would to proceed with surgical evacuation of hematoma from L arm. Risk, benefits, and alternatives to access surgery were discussed.   The patient is aware the risks include but are not limited to: bleeding, infection, nerve damage, thrombosis, complications related to venous hypertension, need for additional procedures, death and stroke.   The patient agrees to proceed forward with the procedure.   Adele Barthel, MD, FACS [facility] [place] Office: [phone] Pager: [phone]  04/29/2017, 12:35 PM

## 2017-04-29 NOTE — ED Triage Notes (Signed)
Pt states he was just discharged from this ED this morning and had just gotten home when his dialysis fistula began to bleed and per ems pt lost approximately 150cc of blood. Pt was seen in ed last night due t pain and swelling under this same site that was operated on sept 20 th at this facility. At this time bleeding is controlled and wrapped with Vaseline gauze and coban.

## 2017-04-29 NOTE — OR Nursing (Signed)
1521 Pt noted to be without a pulse. Chest compressions  and ACLS started.  1522 Defibrillated at 120 joules. ROSC achieved. Dr. Bridgett Larsson made aware and in to see pt. Orders obtained to transfer pt to critical care with cardiology consult.  1525 Pt noted to be in V-Tach with no pulses.  Chest compressions and ACLS resumed. Defibrillated at 120 joules.  Chest compressions resumed.  1528 Defibrillated at 200 joules with return of ROSC,  Airway and VS monitored and maintained per anesthesia.  [id number] Cardiology updated on pt.

## 2017-04-29 NOTE — Discharge Instructions (Signed)
Apply ice several times a day. Keep your arm elevated. Follow up with your surgeon tomorrow

## 2017-04-29 NOTE — Progress Notes (Addendum)
   Daily Progress Note   Pt is s/p L BRVT transposition with revision of the distal fistula (6 mm) to a smaller medial brachial vein (3-4 mm).  On exam, pt presents with sx left upper arm hematoma with evidence of some bloody drainage from incision line.  The left upper arm is firm but no evidence of skin compromise.  Palpable thrill still present.  Sensation and motor intact in hand.  Forearm is slightly swollen  S/p L 2nd BRVT with hematoma with likely some degree of venous stenosis due to smaller conduit in the proximal 1/2 fistula   We discussed options and pt elects to proceed with conservative management:   1.  Would give patient some additional pain rx  2.  Warm compresses to Left upper arm.     Will have pt follow in office next Friday   Pt will follow up soon if sx worsen   Adele Barthel, MD, [facility] [place] Office: [phone] Pager: [phone]  04/29/2017, 7:54 AM

## 2017-04-29 NOTE — Telephone Encounter (Signed)
Patient's wife had called and upon returning the call, she states that patient was having arm pain and swelling and she had called EMS.  They transported him to hospital and she states that he was seen by Dr Bridgett Larsson.  He was released and while getting him to bed the arm started bleeding profusely and she called 911 and patient was taken back to hospital.  She said that she will call back to let us know how he is doing.

## 2017-04-29 NOTE — Progress Notes (Signed)
   Daily Progress Note   Intra-operatively, pt had short run of 3rd degree heart block which resolved spontaneously.  Reportedly at end of case, pt has another run off heart block and had to be have ACLS executed to regain pulse and rhythm.  - Transfer to CCU - stat Cardiology consult   Adele Barthel, MD, [place] [facility] [place] Office: [phone] Pager: [phone]  04/29/2017, 3:34 PM

## 2017-04-29 NOTE — Progress Notes (Signed)
Transported pt on vent from 48M to Cath Lab

## 2017-04-29 NOTE — ED Triage Notes (Signed)
Pt had dialysis fistula placed last Wednesday in the left upper arm. Site is painful, and swollen. Pt states the site has been oozing blood as well, no active bleeding noted at this time.

## 2017-04-29 NOTE — Consult Note (Signed)
Referring Physician: Dr Gwenith Daily ER  Patient name: Oscar Castillo MRN: [id number] DOB: 1951/03/25 Sex: male  REASON FOR CONSULT: left arm swelling  HPI: Oscar Castillo is a 66 y.o. male s/p 2nd stage basilic vein transposition by Dr Bridgett Larsson last week.  He noticed swelling in his left arm on around pod 3.  He denies any swelling in arm prior to this.  He has a left side HD catheter.  He is on coumadin.  He has not had an INR check today.  He denies fever or chills.  Past Medical History:  Diagnosis Date  . Anemia   . Antral ulcer 2014   small  . Atrial fibrillation ([place])   . Atrial fibrillation with RVR ([place]) 10/07/2016  . BACK PAIN, LUMBAR, CHRONIC   . BENIGN PROSTATIC HYPERTROPHY   . Bipolar disorder ([place])    "sometimes" (10/07/2016)  . CHOLELITHIASIS   . Chronic combined systolic and diastolic CHF (congestive heart failure) ([place])   . Complication of anesthesia    wife states pt had trouble waking up with in Nov., 2014  . CVA (cerebral vascular accident) ([place]) 07/2007  . DEPRESSION   . DIABETES MELLITUS, TYPE II    diet control  . ERECTILE DYSFUNCTION   . ESRD (end stage renal disease) on dialysis [facility])    "[place]; TTS" (10/07/2016)  . ESRD on hemodialysis ([place])    ESRD due to DM/HTN. Started dialysis in November 2013.  HD TTS at [facility] on [place].  [name] GERD   . GI bleed    due to gastritis, discharged 10/02/16/notes 10/07/2016  . Headache    "monthly" (10/07/2016)  . Hemorrhoids   . Hepatitis C    C - has been treated  . History of blood transfusion    "related to dialysis" (10/07/2016)  . History of Clostridium difficile   . History of kidney stones   . HYPERTENSION   . LBBB (left bundle branch block)   . Morbid obesity ([place])   . PAF (paroxysmal atrial fibrillation) ([place])    a. Dx 12/2015.   Past Surgical History:  Procedure Laterality Date  . AMPUTATION Left 05/12/2013   Procedure: AMPUTATION RAY;  Surgeon: Newt Minion, MD;  Location: [place];  Service:  Orthopedics;  Laterality: Left;  Left Foot 1st Ray Amputation  . AMPUTATION Left 06/09/2013   Procedure: AMPUTATION BELOW KNEE;  Surgeon: Newt Minion, MD;  Location: [place];  Service: Orthopedics;  Laterality: Left;  Left Below Knee Amputation and removal proximal screws IM tibial nail  . AMPUTATION Right 09/08/2013   Procedure: AMPUTATION BELOW KNEE;  Surgeon: Newt Minion, MD;  Location: [place];  Service: Orthopedics;  Laterality: Right;  Right Below Knee Amputation  . AMPUTATION Right 10/11/2013   Procedure: AMPUTATION BELOW KNEE;  Surgeon: Newt Minion, MD;  Location: [place];  Service: Orthopedics;  Laterality: Right;  Right Below Knee Amputation Revision  . AV FISTULA PLACEMENT  06/14/2012   Procedure: ARTERIOVENOUS (AV) FISTULA CREATION;  Surgeon: Angelia Mould, MD;  Location[facility] OR;  Service: Vascular;  Laterality: Left;  Left basilic vein transposition with fistula.  [name] BASCILIC VEIN TRANSPOSITION Left 02/17/2017   Procedure: LEFT 1ST STAGE BRACHIAL VEIN TRANSPOSITION;  Surgeon: Conrad Kelso, MD;  Location: [place];  Service: Vascular;  Laterality: Left;  . BASCILIC VEIN TRANSPOSITION Left 04/21/2017   Procedure: LEFT 2ND STAGE BRACHIAL VEIN TRANSPOSITION;  Surgeon: Conrad Oden, MD;  [facility] OR;  Service: Vascular;  Laterality: Left;  . COLONOSCOPY N/A 10/28/2012   Procedure: COLONOSCOPY;  Surgeon: Jeryl Columbia, MD;  Location: [facility] ENDOSCOPY;  Service: Endoscopy;  Laterality: N/A;  . COLONOSCOPY N/A 11/02/2012   Procedure: COLONOSCOPY;  Surgeon: Cleotis Nipper, MD;  Location: [facility] ENDOSCOPY;  Service: Endoscopy;  Laterality: N/A;  . COLONOSCOPY N/A 11/03/2012   Procedure: COLONOSCOPY;  Surgeon: Cleotis Nipper, MD;  Location: [facility] ENDOSCOPY;  Service: Endoscopy;  Laterality: N/A;  . COLONOSCOPY N/A 09/16/2016   Procedure: COLONOSCOPY;  Surgeon: Jerene Bears, MD;  Location: [facility] ENDOSCOPY;  Service: Gastroenterology;  Laterality: N/A;  . ENTEROSCOPY N/A 11/08/2012   Procedure:  ENTEROSCOPY;  Surgeon: Wonda Horner, MD;  Location: [facility] ENDOSCOPY;  Service: Endoscopy;  Laterality: N/A;  . ESOPHAGOGASTRODUODENOSCOPY N/A 11/02/2012   Procedure: ESOPHAGOGASTRODUODENOSCOPY (EGD);  Surgeon: Cleotis Nipper, MD;  Location: [facility] ENDOSCOPY;  Service: Endoscopy;  Laterality: N/A;  . ESOPHAGOGASTRODUODENOSCOPY (EGD) WITH PROPOFOL N/A 09/16/2016   Procedure: ESOPHAGOGASTRODUODENOSCOPY (EGD) WITH PROPOFOL;  Surgeon: Jerene Bears, MD;  Location: [facility] ENDOSCOPY;  Service: Gastroenterology;  Laterality: N/A;  . EXCHANGE OF A DIALYSIS CATHETER Left 11/13/2016   Procedure: EXCHANGE OF A DIALYSIS CATHETER;  Surgeon: Elam Dutch, MD;  Location: [place];  Service: Vascular;  Laterality: Left;  . EYE SURGERY Left    to remove scar tissue  . FRACTURE SURGERY    . GIVENS CAPSULE STUDY N/A 11/04/2012   Procedure: GIVENS CAPSULE STUDY;  Surgeon: Cleotis Nipper, MD;  Location: [facility] ENDOSCOPY;  Service: Endoscopy;  Laterality: N/A;  . GIVENS CAPSULE STUDY N/A 09/29/2016   Procedure: GIVENS CAPSULE STUDY;  Surgeon: Manus Gunning, MD;  Location: [place];  Service: Gastroenterology;  Laterality: N/A;  try to keep pt up in bedside chair as much as possible during the study.    [name] HARDWARE REMOVAL Left 06/09/2013   Procedure: HARDWARE REMOVAL;  Surgeon: Newt Minion, MD;  Location: [place];  Service: Orthopedics;  Laterality: Left;  Left Below Knee Amputation  and Removal proximal screws IM tibial nail  . INSERTION OF DIALYSIS CATHETER N/A 11/09/2016   Procedure: INSERTION OF TEMPORARY DIALYSIS CATHETER;  Surgeon: Elam Dutch, MD;  Location: [place];  Service: Vascular;  Laterality: N/A;  . IR GENERIC HISTORICAL  09/27/2016   IR US GUIDE VASC ACCESS RIGHT 09/27/2016 Aletta Edouard, MD [facility]-INTER[facility]  . IR GENERIC HISTORICAL  09/27/2016   IR FLUORO GUIDE CV LINE RIGHT 09/27/2016 Aletta Edouard, MD [facility]  . LIGATION OF ARTERIOVENOUS  FISTULA Left 11/09/2016   Procedure: LIGATION OF ARTERIOVENOUS   FISTULA;  Surgeon: Elam Dutch, MD;  Location: [place];  Service: Vascular;  Laterality: Left;  . NEPHRECTOMY     partial RR  . ORIF FIBULA FRACTURE Left 09/09/2012   Procedure: OPEN REDUCTION INTERNAL FIXATION (ORIF) FIBULA FRACTURE;  Surgeon: Johnny Bridge, MD;  Location: [place];  Service: Orthopedics;  Laterality: Left;  . TIBIA IM NAIL INSERTION Left 09/09/2012   Procedure: INTRAMEDULLARY (IM) NAIL TIBIAL;  Surgeon: Johnny Bridge, MD;  Location: [place];  Service: Orthopedics;  Laterality: Left;  left tibial nail and open reduction internal fixation left fibula fracture  . UPPER EXTREMITY VENOGRAPHY N/A 12/14/2016   Procedure: Bilateral Upper Extremity Venography and Central Venography;  Surgeon: Conrad Richfield, MD;  Location: [place] CV [facility];  Service: Cardiovascular;  Laterality: N/A;    Family History  Problem Relation Age of Onset  . Diabetes  Mother   . Hypertension Mother   . Heart attack Father   . Hypertension Father   . Coronary artery disease Other     SOCIAL HISTORY: Social History   Social History  . Marital status: Married    Spouse name: N/A  . Number of children: 2  . Years of education: 16   Occupational History  . disabled due to stroke Retired   Social History Main Topics  . Smoking status: Never Smoker  . Smokeless tobacco: Never Used  . Alcohol use No  . Drug use: No  . Sexual activity: Yes    Birth control/ protection: None   Other Topics Concern  . 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 No Known Allergies     No current facility-administered medications for this encounter.    Current Outpatient Prescriptions  Medication Sig Dispense Refill  . acetaminophen (TYLENOL) 500 MG tablet Take 500 mg by mouth every 6 (six) hours as needed for mild pain.    [name] amiodarone (PACERONE) 200 MG tablet TAKE 1 TABLET BY MOUTH DAILY,  START THIS ON 11/06/16 90 tablet 3  . atorvastatin (LIPITOR) 40 MG tablet TAKE 1 TABLET(40 MG) BY MOUTH DAILY AT 6 PM 30 tablet 4  . calcium acetate (PHOSLO) 667 MG capsule Take 1,334 mg by mouth 3 (three) times daily with meals.     . latanoprost (XALATAN) 0.005 % ophthalmic solution Place 1 drop into both eyes at bedtime.     [name] QUEtiapine (SEROQUEL) 25 MG tablet TAKE 1 TABLET(25 MG) BY MOUTH TWICE DAILY 60 tablet 8  . SENSIPAR 30 MG tablet Take 30 mg by mouth every evening.     . silver sulfADIAZINE (SILVADENE) 1 % cream Apply 1 application topically daily as needed (irratation). 50 g   . SSD 1 % cream APPLY EXTERNALLY TO THE AFFECTED AREA DAILY 50 g 0  . traMADol (ULTRAM) 50 MG tablet Take 50 mg by mouth every 6 (six) hours as needed for moderate pain.    [name] warfarin (COUMADIN) 2.5 MG tablet Take 1 tablet daily except 1.5 tablets on Wed and Fri or as directed by Anticoagulation Clinic (Patient taking differently: Take 2.5-3.75 mg by mouth See admin instructions. Take 1 tablet daily except 1.5 tablets on Friday) 40 tablet 3  . enoxaparin (LOVENOX) 100 MG/ML injection Inject 1 mL (100 mg total) into the skin daily. (Patient not taking: Reported on 04/29/2017) 10 Syringe 1  . oxyCODONE-acetaminophen (ROXICET) 5-325 MG tablet Take 1 tablet by mouth every 4 (four) hours as needed for moderate pain. 12 tablet 0  . pantoprazole (PROTONIX) 40 MG tablet Take 1 tablet (40 mg total) by mouth 2 (two) times daily before a meal. 60 tablet 11     Physical Examination  Vitals:   04/29/17 0252 04/29/17 0302 04/29/17 0515  BP: 129/82  (!) 152/89  Pulse: 86  85  Resp: 17  13  Temp: 98.4 F (36.9 C)    SpO2: 98%  100%  Weight:  210 lb (95.3 kg)   Height:  5\' 10"  (1.778 m)     Body mass index is 30.13 kg/m.  General:  Alert and oriented, no acute distress Neck: No JVD, left side HD catheter Extremity Pulses:  Diffuse edema extending from elbow to shoulder.  Some serous drainage from incision.  No  real  erythema. No numbness or tingling in hand   ASSESSMENT:  Most likely hematoma left arm rather than central vein stenosis.  He is only mildly symptomatic with this with some discomfort.   PLAN:  1.  Will check INR, most likely conservative management with observation.  Dr Bridgett Larsson will see pt later this morning for final decision regarding drainage vs observation.   Ruta Hinds, MD [facility] [place] Office: [phone] Pager: [phone]

## 2017-04-29 NOTE — Progress Notes (Addendum)
Discussed clinical situation with Dr. Lovena Le (EP), Dr. Jonnie Finner (nephrology) and will contact PCCM service for assistance with mechanical ventilation. Currently no clear reversible cause for complete heart block has been identified. Labs are better than expected. Potassium is 5.0. At this point plan will be to go ahead with permanent pacemaker implantation via right subclavian approach tomorrow afternoon. Preferably by then the patient will have had dialysis and will be extubated. INR after FFP is 2.06. Also discussed most recent events with the patient's wife.  Patient is critically ill with multiple organ system involvement and complex decision-making. Multidisciplinary approach necessary.  Total time 55 minutes.  Sanda Klein, MD, [facility] [phone] office [phone] pager

## 2017-04-29 NOTE — Op Note (Signed)
OPERATIVE NOTE   PROCEDURE: 1. Evacuation of left arm hematoma  PRE-OPERATIVE DIAGNOSIS: anticoagulation for atrial fibrillation, symptomatic left arm hematoma  POST-OPERATIVE DIAGNOSIS: same as above   SURGEON: Adele Barthel, MD  ASSISTANT(S): Linus Orn, CSA  ANESTHESIA: general  ESTIMATED BLOOD LOSS: 50 cc  FINDING(S): 1.  No bleeding from fistula, artery of veins 2.  Diffuse bleeding from raw surfaces with 50 cc of hematoma consistent with therapeutic anticoagulation 3.  Palpable thrill in fistula at end of case  SPECIMEN(S):  none  INDICATIONS:   Oscar Castillo is a 66 y.o. male who presents with left arm hematoma after a left second stage brachial vein transposition on full anticoagulation for his atrial fibrillation.  When I saw this patient earlier today, I felt his hematoma could be manage conservatively.  Patient agreed and declined a surgical evacuation of his hematoma.  He went home then bled from the left arm incision.  On return in the ED, he elected to proceed with surgical evacuation.  The patient is aware the risks include but are not limited to: bleeding, infection, nerve damage, thrombosis, complications related to venous hypertension, need for additional procedures, death and stroke.  The patient agrees to proceed forward with the procedure.  DESCRIPTION: After obtaining full informed written consent, the patient was brought back to the operating room and placed supine upon the operating table.  The patient received IV antibiotics prior to induction.  A procedure time out was completed and the correct surgical site was verified.  After obtaining adequate anesthesia, the patient was prepped and draped in the standard fashion for: Left arm access procedure.  I turned my attention to the left arm.  I removed the Dermabond on the incision.  I sharply transected the prior subcuticular stitches and the subdermal suture.  I immediately evacuated 50 cc of hematoma and  some diffuse ooze from the surgical wound.  I washed out this wound with 1 L of sterile normal saline with a Pulsavac device.  There was no active bleeding from the fistula, artery, or from any vein branches, rather there was a diffuse ooze from the surgical bed.  This was consistent with full therapeutic anticoagulation.  Anesthesia was finishing the final unit of fresh frozen plasma at this point.    I obtained a 15 round JP drain and routed it the subcutaneous tissue distally.  This was secured by placing a 3-0 Nylon stitch which was tied to the drain.  I then sharply shortened the drain so the tip end in the axilla.  I then placed horizontal mattress stitches in the fascia to reapproximate the tissue over the drain.  I left the interval between the stitch large enough to facilitate drainage of any blood in the subcutaneous tissue.  I tried to reapproximated the subcutaneous tissue but the tissue was so friable and inflamed that the stitches tore through the subcutaneous tissue.  I reapproximated subcutaneous tissue distally in this incision with vertical mattress stitches of 3-0 Nylon.  I had the distal fistula adequately covered,  The proximal incision was reapproximated with big stitches of 3-0 Nylon.  I then stapled the incision evert the skin edges.    The JP drain was placed on bulb suction.  The skin was cleaned, dried, and the dressed with ABD pads.  A Kerlix was wrapped around the left upper arm.  Note, during the case, he had a brief run of third degree heart block, which spontaneously resolved.  After I  had left the room, the patient developed this again.  The patient did not regain a normal rhythm and lost his pulse.  CPR and ACLS were executed and the patient regained a normal rhythm and pulse.  Cardiology saw the patient in the operating room and elected to take him to the cardiac catheterization lab.   COMPLICATIONS: none  CONDITION: critical   Adele Barthel, MD, FACS [facility] [place] Office: [phone] Pager: [phone]  04/29/2017, 3:54 PM

## 2017-04-29 NOTE — ED Notes (Signed)
ED Provider at bedside. 

## 2017-04-29 NOTE — Anesthesia Preprocedure Evaluation (Addendum)
Anesthesia Evaluation  Patient identified by MRN, date of birth, ID band Patient awake    Reviewed: Allergy & Precautions, NPO status , Patient's Chart, lab work & pertinent test results  Airway Mallampati: II  TM Distance: >3 FB Neck ROM: Full    Dental  (+) Edentulous Upper, Poor Dentition   Pulmonary neg pulmonary ROS,    Pulmonary exam normal breath sounds clear to auscultation       Cardiovascular hypertension, + CAD and +CHF  Normal cardiovascular exam+ dysrhythmias Atrial Fibrillation  Rhythm:Regular Rate:Normal  ECG: SR, rate 75. LBBB  ECHO: LV EF: 45% -   50%   Neuro/Psych  Headaches, PSYCHIATRIC DISORDERS Depression Bipolar Disorder TIACVA negative psych ROS   GI/Hepatic PUD, Bowel prep,(+) Hepatitis -, C  Endo/Other  diabetes, Poorly Controlled, Type 1  Renal/GU DialysisRenal disease  negative genitourinary   Musculoskeletal negative musculoskeletal ROS (+) BACK PAIN, LUMBAR, CHRONIC Left arm hemotoma   Abdominal (+) + obese,   Peds negative pediatric ROS (+)  Hematology  (+) anemia , Patient on full dose coumadin anticoagulated   Anesthesia Other Findings   Reproductive/Obstetrics negative OB ROS                          Anesthesia Physical  Anesthesia Plan  ASA: IV  Anesthesia Plan: General   Post-op Pain Management:    Induction: Intravenous  PONV Risk Score and Plan: 3 and Ondansetron, Dexamethasone, Midazolam and Propofol infusion  Airway Management Planned: Oral ETT  Additional Equipment:   Intra-op Plan:   Post-operative Plan: Extubation in OR  Informed Consent: I have reviewed the patients History and Physical, chart, labs and discussed the procedure including the risks, benefits and alternatives for the proposed anesthesia with the patient or authorized representative who has indicated his/her understanding and acceptance.   Dental advisory given  Plan  Discussed with: CRNA and Surgeon  Anesthesia Plan Comments:        Anesthesia Quick Evaluation

## 2017-04-29 NOTE — Transfer of Care (Signed)
Immediate Anesthesia Transfer of Care Note  Patient: Oscar Castillo  Procedure(s) Performed: EVACUATION HEMATOMA LEFT ARM (Left Arm Upper)  Patient Location: ICU  Anesthesia Type:General  Level of Consciousness: drowsy and Patient remains intubated per anesthesia plan  Airway & Oxygen Therapy: Patient re-intubated, Patient remains intubated per anesthesia plan and Patient placed on Ventilator (see vital sign flow sheet for setting)  Post-op Assessment: Report given to RN and Post -op Vital signs reviewed and unstable, Anesthesiologist notified  Post vital signs: Reviewed and unstable   Last Vitals:  Vitals:   04/29/17 1249 04/29/17 1304  BP: 115/78 112/70  Pulse: 77 73  Resp: 16 15  Temp: 36.7 C 36.7 C  SpO2:  100%    Last Pain:  Vitals:   04/29/17 1304  TempSrc: Oral  PainSc:          Complications: No apparent anesthesia complications

## 2017-04-29 NOTE — Progress Notes (Addendum)
Patient arrived in cath holding intubated, on vent-fiio2 100@, Vt 600cc, f-20, 85mmhg peep. Responds to pain and seems to be aware of MD speaking to him, quickly asleep. Receiving amiodarone at 59.9mg /h via side arm of RFV sheath- temp wire in place, rate 80, MA 5.. ABG drawn by Respiratory Therapist, unable to obtain O2 sat reading. 22G IV in rt hand. Vent rate increased to 25 per RT--ABG's-ph-7.37, pco2-33, po2-421, sat-97.8bic-19. fio2 decreased to 50%. Profofol started per order.

## 2017-04-29 NOTE — ED Notes (Signed)
FFP rate changed to 238ml/hour per MD [facility]

## 2017-04-29 NOTE — ED Provider Notes (Signed)
[facility] DEPT Provider Note   CSN: [id number] Arrival date & time: 04/29/17  0243     History   Chief Complaint Chief Complaint  Patient presents with  . Vascular Access Problem    HPI Oscar Castillo is a 66 y.o. male.  The history is provided by the patient.  He has end-stage renal disease and is on hemodialysis. He had a fistula created in his left arm on September 26. 3 days ago, his arm started swelling and becoming painful. Swelling and pain is increased. He currently puts the pain at 10/10. Pain is worse with moving. Nothing makes it better. He did take a dose of tramadol with no relief. He had called the vascular surgeon's office, but got a voicemail number he left a message, but had not heard back from them. He denies fever or chills. He denies dyspnea. He is scheduled for dialysis later this morning.  Past Medical History:  Diagnosis Date  . Anemia   . Antral ulcer 2014   small  . Atrial fibrillation ([place])   . Atrial fibrillation with RVR ([place]) 10/07/2016  . BACK PAIN, LUMBAR, CHRONIC   . BENIGN PROSTATIC HYPERTROPHY   . Bipolar disorder ([place])    "sometimes" (10/07/2016)  . CHOLELITHIASIS   . Chronic combined systolic and diastolic CHF (congestive heart failure) ([place])   . Complication of anesthesia    wife states pt had trouble waking up with in Nov., 2014  . CVA (cerebral vascular accident) ([place]) 07/2007  . DEPRESSION   . DIABETES MELLITUS, TYPE II    diet control  . ERECTILE DYSFUNCTION   . ESRD (end stage renal disease) on dialysis [facility])    "[place]; TTS" (10/07/2016)  . ESRD on hemodialysis ([place])    ESRD due to DM/HTN. Started dialysis in November 2013.  HD TTS at [facility] on [address].  [name] GERD   . GI bleed    due to gastritis, discharged 10/02/16/notes 10/07/2016  . Headache    "monthly" (10/07/2016)  . Hemorrhoids   . Hepatitis C    C - has been treated  . History of blood transfusion    "related to dialysis" (10/07/2016)  . History of  Clostridium difficile   . History of kidney stones   . HYPERTENSION   . LBBB (left bundle branch block)   . Morbid obesity ([place])   . PAF (paroxysmal atrial fibrillation) ([place])    a. Dx 12/2015.    Patient Active Problem List   Diagnosis Date Noted  . General weakness 02/10/2017  . Gait disorder 02/10/2017  . MSSA bacteremia   . Sepsis ([place]) 11/08/2016  . Encounter for therapeutic drug monitoring 10/07/2016  . Diabetes mellitus with complication ([place])   . Anemia due to stage 5 chronic kidney disease ([place])   . Melena 09/26/2016  . Pressure injury of skin 09/26/2016  . Benign neoplasm of descending colon   . Gastroesophageal reflux disease with esophagitis   . Duodenal ulcer without hemorrhage or perforation   . Blood loss anemia 09/15/2016  . Paroxysmal atrial fibrillation ([place]) 09/15/2016  . Heme positive stool 09/15/2016  . RUQ pain 07/12/2016  . Hyperkalemia 07/01/2016  . Anemia 12/31/2015  . Glaucoma 12/27/2015  . LBBB (left bundle branch block)   . Rash 11/15/2015  . Cramping of hands 11/15/2015  . Liver fibrosis 10/07/2015  . Diarrhea 08/14/2014  . Dehydration 10/02/2013  . Fever 10/02/2013  . Altered mental status 10/01/2013  . Encephalopathy, toxic 10/01/2013  .  Encephalopathy, metabolic 54/27/0623  . History of stroke 09/28/2013  . FTT (failure to thrive) in adult 09/28/2013  . Acute confusional state 09/28/2013  . Ulcer of sacral region, stage 3 ([place]) 09/26/2013  . S/P BKA (below knee amputation) bilateral ([place]) 09/08/2013  . ESRD on hemodialysis ([place]) 05/09/2013  . TIA (transient ischemic attack) 02/20/2013  . Acute blood loss anemia 10/28/2012  . Chronic combined systolic and diastolic congestive heart failure 10/28/2012  . Constipation 10/28/2012  . GI bleed 10/27/2012  . Mass in rectum 10/27/2012  . Type 2 diabetes mellitus with chronic kidney disease on chronic dialysis, with long-term current use of insulin ([place]) 09/08/2012  . ESRD (end stage renal  disease) ([place]) 09/08/2012  . LVH (left ventricular hypertrophy)-severe concentric 06/13/2012  . Hyperlipidemia 01/30/2011  . CHOLELITHIASIS 08/01/2010  . BENIGN PROSTATIC HYPERTROPHY 08/01/2010  . CEREBROVASCULAR ACCIDENT, HX OF 08/06/2009  . Depression 03/18/2009  . NEPHROLITHIASIS, HX OF 03/18/2009  . Morbid obesity ([place]) 03/25/2007  . Hypertensive heart disease with CHF (congestive heart failure) ([place]) 03/25/2007  . GERD 03/25/2007  . Chronic hepatitis C without hepatic coma ([place]) 03/25/2007    Past Surgical History:  Procedure Laterality Date  . AMPUTATION Left 05/12/2013   Procedure: AMPUTATION RAY;  Surgeon: Newt Minion, MD;  Location: [place];  Service: Orthopedics;  Laterality: Left;  Left Foot 1st Ray Amputation  . AMPUTATION Left 06/09/2013   Procedure: AMPUTATION BELOW KNEE;  Surgeon: Newt Minion, MD;  Location: [place];  Service: Orthopedics;  Laterality: Left;  Left Below Knee Amputation and removal proximal screws IM tibial nail  . AMPUTATION Right 09/08/2013   Procedure: AMPUTATION BELOW KNEE;  Surgeon: Newt Minion, MD;  Location: [place];  Service: Orthopedics;  Laterality: Right;  Right Below Knee Amputation  . AMPUTATION Right 10/11/2013   Procedure: AMPUTATION BELOW KNEE;  Surgeon: Newt Minion, MD;  Location: [place];  Service: Orthopedics;  Laterality: Right;  Right Below Knee Amputation Revision  . AV FISTULA PLACEMENT  06/14/2012   Procedure: ARTERIOVENOUS (AV) FISTULA CREATION;  Surgeon: Angelia Mould, MD;  Location: [facility] OR;  Service: Vascular;  Laterality: Left;  Left basilic vein transposition with fistula.  [name] BASCILIC VEIN TRANSPOSITION Left 02/17/2017   Procedure: LEFT 1ST STAGE BRACHIAL VEIN TRANSPOSITION;  Surgeon: Conrad Orleans, MD;  Location: [place];  Service: Vascular;  Laterality: Left;  . BASCILIC VEIN TRANSPOSITION Left 04/21/2017   Procedure: LEFT 2ND STAGE BRACHIAL VEIN TRANSPOSITION;  Surgeon: Conrad Yankton, MD;  Location: [place];  Service:  Vascular;  Laterality: Left;  . COLONOSCOPY N/A 10/28/2012   Procedure: COLONOSCOPY;  Surgeon: Jeryl Columbia, MD;  Location[facility]COPY;  Service: Endoscopy;  Laterality: N/A;  . COLONOSCOPY N/A 11/02/2012   Procedure: COLONOSCOPY;  Surgeon: Cleotis Nipper, MD;  Location[facility];  Service: Endoscopy;  Laterality: N/A;  . COLONOSCOPY N/A 11/03/2012   Procedure: COLONOSCOPY;  Surgeon: Cleotis Nipper, MD;  Location: [facility] ENDOSCOPY;  Service: Endoscopy;  Laterality: N/A;  . COLONOSCOPY N/A 09/16/2016   Procedure: COLONOSCOPY;  Surgeon: Jerene Bears, MD;  Location: [facility] ENDOSCOPY;  Service: Gastroenterology;  Laterality: N/A;  . ENTEROSCOPY N/A 11/08/2012   Procedure: ENTEROSCOPY;  Surgeon: Wonda Horner, MD;  Location: [facility] ENDOSCOPY;  Service: Endoscopy;  Laterality: N/A;  . ESOPHAGOGASTRODUODENOSCOPY N/A 11/02/2012   Procedure: ESOPHAGOGASTRODUODENOSCOPY (EGD);  Surgeon: Cleotis Nipper, MD;  Location: [facility] ENDOSCOPY;  Service: Endoscopy;  Laterality: N/A;  . ESOPHAGOGASTRODUODENOSCOPY (EGD) WITH PROPOFOL N/A 09/16/2016  Procedure: ESOPHAGOGASTRODUODENOSCOPY (EGD) WITH PROPOFOL;  Surgeon: Jerene Bears, MD;  Location: [facility] ENDOSCOPY;  Service: Gastroenterology;  Laterality: N/A;  . EXCHANGE OF A DIALYSIS CATHETER Left 11/13/2016   Procedure: EXCHANGE OF A DIALYSIS CATHETER;  Surgeon: Elam Dutch, MD;  Location: [place];  Service: Vascular;  Laterality: Left;  . EYE SURGERY Left    to remove scar tissue  . FRACTURE SURGERY    . GIVENS CAPSULE STUDY N/A 11/04/2012   Procedure: GIVENS CAPSULE STUDY;  Surgeon: Cleotis Nipper, MD;  Location: [facility] ENDOSCOPY;  Service: Endoscopy;  Laterality: N/A;  . GIVENS CAPSULE STUDY N/A 09/29/2016   Procedure: GIVENS CAPSULE STUDY;  Surgeon: Manus Gunning, MD;  Location: [place];  Service: Gastroenterology;  Laterality: N/A;  try to keep pt up in bedside chair as much as possible during the study.    [name] HARDWARE REMOVAL Left 06/09/2013   Procedure: HARDWARE  REMOVAL;  Surgeon: Newt Minion, MD;  Location: [place];  Service: Orthopedics;  Laterality: Left;  Left Below Knee Amputation  and Removal proximal screws IM tibial nail  . INSERTION OF DIALYSIS CATHETER N/A 11/09/2016   Procedure: INSERTION OF TEMPORARY DIALYSIS CATHETER;  Surgeon: Elam Dutch, MD;  Location: [facility];  Service: Vascular;  Laterality: N/A;  . IR GENERIC HISTORICAL  09/27/2016   IR US GUIDE VASC ACCESS RIGHT 09/27/2016 Aletta Edouard, MD [facility]-INTER[facility]  . IR GENERIC HISTORICAL  09/27/2016   IR FLUORO GUIDE CV LINE RIGHT 09/27/2016 Aletta Edouard, MD [facility]  . LIGATION OF ARTERIOVENOUS  FISTULA Left 11/09/2016   Procedure: LIGATION OF ARTERIOVENOUS  FISTULA;  Surgeon: Elam Dutch, MD;  Location: [place];  Service: Vascular;  Laterality: Left;  . NEPHRECTOMY     partial RR  . ORIF FIBULA FRACTURE Left 09/09/2012   Procedure: OPEN REDUCTION INTERNAL FIXATION (ORIF) FIBULA FRACTURE;  Surgeon: Johnny Bridge, MD;  Location: [place];  Service: Orthopedics;  Laterality: Left;  . TIBIA IM NAIL INSERTION Left 09/09/2012   Procedure: INTRAMEDULLARY (IM) NAIL TIBIAL;  Surgeon: Johnny Bridge, MD;  Location: [place];  Service: Orthopedics;  Laterality: Left;  left tibial nail and open reduction internal fixation left fibula fracture  . UPPER EXTREMITY VENOGRAPHY N/A 12/14/2016   Procedure: Bilateral Upper Extremity Venography and Central Venography;  Surgeon: Conrad [place]en Bay, MD;  Location: [place] CV LAB;  Service: Cardiovascular;  Laterality: N/A;       Home Medications    Prior to Admission medications   Medication Sig Start Date End Date Taking? Authorizing Provider  acetaminophen (TYLENOL) 500 MG tablet Take 500 mg by mouth every 6 (six) hours as needed for mild pain.    [provider]  amiodarone (PACERONE) 200 MG tablet TAKE 1 TABLET BY MOUTH DAILY, START THIS ON 11/06/16 11/02/16   Croitoru, Mihai, MD  atorvastatin (LIPITOR) 40 MG tablet TAKE 1 TABLET(40 MG) BY MOUTH  DAILY AT 6 PM 12/02/16   [name], MD  calcium acetate (PHOSLO) 667 MG capsule Take 1,334 mg by mouth 3 (three) times daily with meals.     [provider]  enoxaparin (LOVENOX) 100 MG/ML injection Inject 1 mL (100 mg total) into the skin daily. 04/13/17   Fay Records, MD  latanoprost (XALATAN) 0.005 % ophthalmic solution Place 1 drop into both eyes at bedtime.  06/12/16   [provider]  oxyCODONE-acetaminophen (ROXICET) 5-325 MG tablet Take 1 tablet by mouth every 4 (four) hours as  needed for moderate pain. 04/21/17   Ulyses Amor, PA-C  pantoprazole (PROTONIX) 40 MG tablet Take 1 tablet (40 mg total) by mouth 2 (two) times daily before a meal. 10/14/16   [name], MD  QUEtiapine (SEROQUEL) 25 MG tablet TAKE 1 TABLET(25 MG) BY MOUTH TWICE DAILY 01/05/17   [name], MD  SENSIPAR 30 MG tablet Take 30 mg by mouth every evening.  01/09/15   [provider]  silver sulfADIAZINE (SILVADENE) 1 % cream Apply 1 application topically daily as needed (irratation). 03/01/17   Suzan Slick, NP  SSD 1 % cream APPLY EXTERNALLY TO THE AFFECTED AREA DAILY 04/27/17   Newt Minion, MD  warfarin (COUMADIN) 2.5 MG tablet Take 1 tablet daily except 1.5 tablets on Wed and Fri or as directed by Anticoagulation Clinic 12/28/16   Fay Records, MD    Family History Family History  Problem Relation Age of Onset  . Diabetes Mother   . Hypertension Mother   . Heart attack Father   . Hypertension Father   . Coronary artery disease Other     Social History Social History  Substance Use Topics  . Smoking status: Never Smoker  . Smokeless tobacco: Never Used  . Alcohol use No     Allergies   No known allergies   Review of Systems Review of Systems  All other systems reviewed and are negative.    Physical Exam Updated Vital Signs BP 129/82   Pulse 86   Temp 98.4 F (36.9 C)   Resp 17   Ht 5\' 10"  (1.778 m)   Wt 95.3 kg (210 lb)   SpO2 98%   BMI 30.13 kg/m     Physical Exam  Nursing note and vitals reviewed.  66 year old male, resting comfortably and in no acute distress. Vital signs are normal. Oxygen saturation is 98%, which is normal. Head is normocephalic and atraumatic. PERRLA, EOMI. Oropharynx is clear. Neck is nontender and supple without adenopathy or JVD. Back is nontender and there is no CVA tenderness. Lungs are clear without rales, wheezes, or rhonchi. Chest is nontender. Dialysis access catheter present in the left subclavian area. Heart has regular rate and rhythm without murmur. Abdomen is soft, flat, nontender without masses or hepatosplenomegaly and peristalsis is normoactive. Extremities: Bilateral below-the-knee amputations. Marked swelling and tenderness of the left upper arm. Surgical site in the left upper arm appears to be healing well without sign of infection. Very faint bruit is heard over the fistula in the left upper arm. Distal neurovascular exam is intact with strong pulses, normal sensation. Skin is warm and dry without rash. Neurologic: Mental status is normal, cranial nerves are intact, there are no motor or sensory deficits.  ED Treatments / Results  Labs (all labs ordered are listed, but only abnormal results are displayed) Labs Reviewed  COMPREHENSIVE METABOLIC PANEL - Abnormal; Notable for the following:       Result Value   Sodium 134 (*)    Chloride 98 (*)    CO2 21 (*)    Glucose, Bld 158 (*)    BUN 65 (*)    Creatinine, Ser 10.01 (*)    Calcium 8.8 (*)    ALT 12 (*)    GFR calc non Af Amer 5 (*)    GFR calc Af Amer 6 (*)    All other components within normal limits  CBC WITH DIFFERENTIAL/PLATELET - Abnormal; Notable for the following:  RBC 3.76 (*)    Hemoglobin 10.6 (*)    HCT 32.8 (*)    RDW 17.4 (*)    All other components within normal limits  PROTIME-INR - Abnormal; Notable for the following:    Prothrombin Time 26.9 (*)    All other components within normal limits  I-STAT CG4  LACTIC ACID, ED - Abnormal; Notable for the following:    Lactic Acid, Venous 2.13 (*)    All other components within normal limits  URINALYSIS, ROUTINE W REFLEX MICROSCOPIC  I-STAT TROPONIN, ED  I-STAT CG4 LACTIC ACID, ED   Procedures Procedures (including critical care time)  Medications Ordered in ED Medications  morphine 4 MG/ML injection 4 mg (4 mg Intravenous Given 04/29/17 0446)     Initial Impression / Assessment and Plan / ED Course  I have reviewed the triage vital signs and the nursing notes.  Pertinent labs & imaging results that were available during my care of the patient were reviewed by me and considered in my medical decision making (see chart for details).  Pain and swelling left upper arm which is worrisome for the fistula having clotted. There apparently is still some blood flow through the fistula based on presence of bruit. Old records are reviewed confirming recent vascular surgery procedure. We'll need to consult vascular surgery.  Vascular surgery has seen patient and this is an expected postoperative complication. No indication for any acute intervention. Patient is sent home with prescription for oxycodone have acetaminophen and is to follow-up with his surgeon tomorrow.  Final Clinical Impressions(s) / ED Diagnoses   Final diagnoses:  Post-operative pain  Left arm swelling  End-stage renal disease on hemodialysis ([place])  Anticoagulated on warfarin  Anemia associated with chronic renal failure    New Prescriptions Current Discharge Medication List       Delora Fuel, MD 42/70/62 [phone]

## 2017-04-29 NOTE — Progress Notes (Addendum)
   Daily Progress Note   After the patient transiently stabilized in the OR, I contacted the patient's wife to give her an update of her husband's critical status.  We discussed that his heart had lost its natural conduction and Cardiology was going to evaluate her husband for temporary pacing with likely needed for a permanent pacemaker.   Adele Barthel, MD, FACS [facility] Office: [phone] Pager: [phone]  04/29/2017, 5:58 PM  Addendum  Pt's wife updated on Cardiology events.  No active bleeding on L arm bandages currently, with minimal JP output.   Adele Barthel, MD, FACS [facility] Office: [phone] Pager: [phone]  04/29/2017, 6:58 PM

## 2017-04-29 NOTE — Progress Notes (Signed)
CRITICAL VALUE ALERT  Critical Value:  Troponin 0.12  Date & Time Notied:  04/29/2017 at 2220  Provider Notified: Emilio Aspen, MD   Orders Received/Actions taken: No new orders given at this time

## 2017-04-29 NOTE — Progress Notes (Signed)
Increased RR to 25 at 1800 ABG results on PRVC VY 600, RR 25, 100%, +5 PEEP ph 7.38, PCO2 33, PO2 421, HCO3 19.  Decreased FIO2 to 50% once showing results to Dr Pearline Cables.

## 2017-04-29 NOTE — Consult Note (Signed)
PULMONARY / CRITICAL CARE MEDICINE   Name: JANE BROUGHTON MRN: [id number] DOB: Dec 08, 1950    ADMISSION DATE:  04/29/2017 CONSULTATION DATE: 04/29/2017*    CHIEF COMPLAINT:  Intubated following 2 episodes of third-degree AV block and a subsequent episode of VT.  HISTORY OF PRESENT ILLNESS:  This is a 66 year old diabetic with end-stage renal disease on dialysis who recently underwent a revision of his left arm AV fistula. He had bleeding from the fistula site and he went to the operating room today for evacuation of hematoma. Intraoperatively he had an episode of third degree AV block. Is not clear that there was any vagal stimulation at that time. The only laboratories I have her potassium of 5 and does not clear at what point during the procedure that was drawn. Sequelae he was taken to the recovery room where when he was extubated he again suffered from an episode of third-degree AV block. He was given a combination of calcium, bicarbonate and epinephrine and briefly had a run of V. Tach. He was rapidly converted and subsequently had recurrent high degree block. He was taken to the cardiac catheterization laboratory and a temporary pacing wire was placed via the right groin. He is now intubated and mechanically ventilated we are asked to assist in his management. He is somewhat agitated and not able to give [place] any coherent history.  PAST MEDICAL HISTORY :  He  has a past medical history of Anemia; Antral ulcer (2014); Atrial fibrillation ([place]); Atrial fibrillation with RVR ([place]) (10/07/2016); BACK PAIN, LUMBAR, CHRONIC; BENIGN PROSTATIC HYPERTROPHY; Bipolar disorder ([place]); CHOLELITHIASIS; Chronic combined systolic and diastolic CHF (congestive heart failure) ([place]); Complication of anesthesia; CVA (cerebral vascular accident) ([place]) (07/2007); DEPRESSION; DIABETES MELLITUS, TYPE II; ERECTILE DYSFUNCTION; ESRD (end stage renal disease) on dialysis ([place]); ESRD on hemodialysis ([place]); GERD; GI bleed;  Headache; Hemorrhoids; Hepatitis C; History of blood transfusion; History of Clostridium difficile; History of kidney stones; HYPERTENSION; LBBB (left bundle branch block); Morbid obesity ([place]); and PAF (paroxysmal atrial fibrillation) ([place]).  PAST SURGICAL HISTORY: He  has a past surgical history that includes Nephrectomy; AV fistula placement (06/14/2012); Tibia IM nail insertion (Left, 09/09/2012); ORIF fibula fracture (Left, 09/09/2012); Colonoscopy (N/A, 10/28/2012); Esophagogastroduodenoscopy (N/A, 11/02/2012); Colonoscopy (N/A, 11/02/2012); Colonoscopy (N/A, 11/03/2012); Givens capsule study (N/A, 11/04/2012); enteroscopy (N/A, 11/08/2012); Amputation (Left, 05/12/2013); Eye surgery (Left); Amputation (Left, 06/09/2013); Hardware Removal (Left, 06/09/2013); Amputation (Right, 09/08/2013); Amputation (Right, 10/11/2013); Colonoscopy (N/A, 09/16/2016); Esophagogastroduodenoscopy (egd) with propofol (N/A, 09/16/2016); ir generic historical (09/27/2016); ir generic historical (09/27/2016); Givens capsule study (N/A, 09/29/2016); Fracture surgery; Ligation of arteriovenous  fistula (Left, 11/09/2016); Insertion of dialysis catheter (N/A, 11/09/2016); Exchange of a dialysis catheter (Left, 11/13/2016); UPPER EXTREMITY VENOGRAPHY (N/A, 12/14/2016); Bascilic vein transposition (Left, 01/27/5008); and Bascilic vein transposition (Left, 04/21/2017).  Allergies  Allergen Reactions  . No Known Allergies     No current facility-administered medications on file prior to encounter.    Current Outpatient Prescriptions on File Prior to Encounter  Medication Sig  . acetaminophen (TYLENOL) 500 MG tablet Take 500 mg by mouth every 6 (six) hours as needed for mild pain.  [name] amiodarone (PACERONE) 200 MG tablet TAKE 1 TABLET BY MOUTH DAILY, START THIS ON 11/06/16  . atorvastatin (LIPITOR) 40 MG tablet TAKE 1 TABLET(40 MG) BY MOUTH DAILY AT 6 PM  . calcium acetate (PHOSLO) 667 MG capsule Take 1,334 mg by mouth 3 (three) times daily with meals.    . latanoprost (XALATAN) 0.005 % ophthalmic solution Place 1 drop into both  eyes at bedtime.   [name] oxyCODONE-acetaminophen (ROXICET) 5-325 MG tablet Take 1 tablet by mouth every 4 (four) hours as needed for moderate pain.  . pantoprazole (PROTONIX) 40 MG tablet Take 1 tablet (40 mg total) by mouth 2 (two) times daily before a meal.  . QUEtiapine (SEROQUEL) 25 MG tablet TAKE 1 TABLET(25 MG) BY MOUTH TWICE DAILY  . SENSIPAR 30 MG tablet Take 30 mg by mouth every evening.   . silver sulfADIAZINE (SILVADENE) 1 % cream Apply 1 application topically daily as needed (irratation).  . SSD 1 % cream APPLY EXTERNALLY TO THE AFFECTED AREA DAILY  . traMADol (ULTRAM) 50 MG tablet Take 50 mg by mouth every 6 (six) hours as needed for moderate pain.  [name] warfarin (COUMADIN) 2.5 MG tablet Take 1 tablet daily except 1.5 tablets on Wed and Fri or as directed by Anticoagulation Clinic (Patient taking differently: Take 2.5-3.75 mg by mouth See admin instructions. Take 1 tablet daily except 1.5 tablets on Friday)    FAMILY HISTORY:  His indicated that his mother is deceased. He indicated that his father is deceased. He indicated that all of his three sisters are alive. He indicated that his brother is alive. He indicated that both of his daughters are alive. He indicated that the status of his other is unknown.    SOCIAL HISTORY: He  reports that he has never smoked. He has never used smokeless tobacco. He reports that he does not drink alcohol or use drugs.  REVIEW OF SYSTEMS:  Unobtainable  SUBJECTIVE: Unobtainable  VITAL SIGNS: BP 100/68   Pulse 93   Temp 98.1 F (36.7 C) (Oral)   Resp 20   SpO2 (!) 0%   HEMODYNAMICS:    VENTILATOR SETTINGS:    INTAKE / OUTPUT: No intake/output data recorded.  PHYSICAL EXAMINATION: General:  This is a large 66 year old male who is orally intubated and mechanically ventilated. Neuro:  Pupils are equal and he does appear to move both upper extremities. Somewhat  agitated and not consistently nodding to questions. Cardiovascular:  S1 and S2 are regular without murmur rub or gallop. Lungs:  He is intubated with an 8 endotracheal tube to 26 cm. There is symmetric air movement a few scattered rhonchi and no wheezes. Abdomen:  The abdomen is obese and soft without any overt organomegaly masses tenderness guarding or rebound. Musculoskeletal:  He is status post bilateral BKA.   LABS:  BMET  Recent Labs Lab 04/29/17 0308 04/29/17 1600  NA 134* 132*  K 4.8 5.0  CL 98* 99*  CO2 21* 20*  BUN 65* 70*  CREATININE 10.01* 10.35*  GLUCOSE 158* 234*    Electrolytes  Recent Labs Lab 04/29/17 0308 04/29/17 1600  CALCIUM 8.8* 10.6*    CBC  Recent Labs Lab 04/29/17 0308  WBC 7.1  HGB 10.6*  HCT 32.8*  PLT 249    Coag's  Recent Labs Lab 04/27/17 04/29/17 0308 04/29/17 1600  INR 2.7 2.52 2.06    Sepsis Markers  Recent Labs Lab 04/29/17 0400 04/29/17 0609  LATICACIDVEN 2.13* 1.35    ABG No results for input(s): PHART, PCO2ART, PO2ART in the last 168 hours.  Liver Enzymes  Recent Labs Lab 04/29/17 0308  AST 17  ALT 12*  ALKPHOS 62  BILITOT 0.5  ALBUMIN 3.5    Cardiac Enzymes No results for input(s): TROPONINI, PROBNP in the last 168 hours.  Glucose No results for input(s): GLUCAP in the last 168 hours.  Imaging No results found.  STUDIES:   CULTURES:  ANTIBIOTICS:  SIGNIFICANT EVENTS:   LINES/TUBES:   DISCUSSION: 66 year old diabetic who suffered from third degree AV block during a shunt revision and again when he was extubated. After the second episode he was given epi, calcium and bicarb and was briefly in VT. He was shocked out but again had high degree block and a Temporary pacing wire has been placed via the right groin. He remains intubated and mechanically ventilated.    ASSESSMENT / PLAN:  PULMONARY A: He is intubated for airway protection of present. I am awaiting the blood gas in  order to ensure that significant acidosis did not contribute to his arrhythmia. I plan to leave him intubated overnight and if there are no difficulties with acidosis hyperkalemia or other metabolic contributors AV block  plan to extubate him in a setting of minimal sedation CO2 retention and acidosis will not be a concern.   CARDIOVASCULAR A:  Third-degree AV block with temporary pacer placement. He is said to have a nonischemic cardiomyopathy however I have sent serial enzymes. EKG is not interpretable as it is a left bundle branch block at baseline. Ionized calcium, potassium, arterial pH and TSH are all pending. He is on amiodarone but no other potential AV blocking agents.   RENAL A:   Dialysis dependent end-stage renal disease GASTROINTESTINAL A:   GI prophylaxis has been ordered   Endocrine. Scale insulin has been ordered  Rate is 32 minutes spent in the care of this acutely ill patient today    Lars Masson, MD Pulmonary and [place] Pager: [phone]  04/29/2017, 6:02 PM

## 2017-04-29 NOTE — ED Provider Notes (Signed)
[name]   CSN: [id number] Arrival date & time: 04/29/17  1047     History   Chief Complaint Chief Complaint  Patient presents with  . Vascular Access Problem    HPI Oscar Castillo is a 66 y.o. male.  Patient is a 66 year old male with multiple medical problems including end-stage renal disease on dialysis who was just discharged from the emergency room 2 hours prior to arrival.  Patient initially came because he was having issues with his new graft which was placed in his left upper arm. He recently underwent surgery for graft placement and then had a revision last week. He is noticed in the last 3-4 days pain and swelling around the surgical site. Patient was evaluated by his vascular surgeon here. Patient does take Coumadin and INR was 2.5 today. Patient was given the option for surgical evacuation of clot versus conservative management. He initially chose conservative management. However patient got home and he was moving over to the bed when his arm started bleeding profusely. Wife states it was squirting towards the ceiling. Now he states that his arm does not hurt as bad but is still been swollen. He denies any numbness or tingling in his fingers.   The history is provided by the patient.    Past Medical History:  Diagnosis Date  . Anemia   . Antral ulcer 2014   small  . Atrial fibrillation ([place])   . Atrial fibrillation with RVR ([place]) 10/07/2016  . BACK PAIN, LUMBAR, CHRONIC   . BENIGN PROSTATIC HYPERTROPHY   . Bipolar disorder ([place])    "sometimes" (10/07/2016)  . CHOLELITHIASIS   . Chronic combined systolic and diastolic CHF (congestive heart failure) ([place])   . Complication of anesthesia    wife states pt had trouble waking up with in Nov., 2014  . CVA (cerebral vascular accident) ([place]) 07/2007  . DEPRESSION   . DIABETES MELLITUS, TYPE II    diet control  . ERECTILE DYSFUNCTION   . ESRD (end stage renal disease) on dialysis [facility])    "[place];  TTS" (10/07/2016)  . ESRD on hemodialysis ([place])    ESRD due to DM/HTN. Started dialysis in November 2013.  HD TTS at [facility] [address].  [name] GERD   . GI bleed    due to gastritis, discharged 10/02/16/notes 10/07/2016  . Headache    "monthly" (10/07/2016)  . Hemorrhoids   . Hepatitis C    C - has been treated  . History of blood transfusion    "related to dialysis" (10/07/2016)  . History of Clostridium difficile   . History of kidney stones   . HYPERTENSION   . LBBB (left bundle branch block)   . Morbid obesity ([place])   . PAF (paroxysmal atrial fibrillation) ([place])    a. Dx 12/2015.    Patient Active Problem List   Diagnosis Date Noted  . General weakness 02/10/2017  . Gait disorder 02/10/2017  . MSSA bacteremia   . Sepsis ([place]) 11/08/2016  . Encounter for therapeutic drug monitoring 10/07/2016  . Diabetes mellitus with complication ([place])   . Anemia due to stage 5 chronic kidney disease ([place])   . Melena 09/26/2016  . Pressure injury of skin 09/26/2016  . Benign neoplasm of descending colon   . Gastroesophageal reflux disease with esophagitis   . Duodenal ulcer without hemorrhage or perforation   . Blood loss anemia 09/15/2016  . Paroxysmal atrial fibrillation ([place]) 09/15/2016  . Heme positive  stool 09/15/2016  . RUQ pain 07/12/2016  . Hyperkalemia 07/01/2016  . Anemia 12/31/2015  . Glaucoma 12/27/2015  . LBBB (left bundle branch block)   . Rash 11/15/2015  . Cramping of hands 11/15/2015  . Liver fibrosis 10/07/2015  . Diarrhea 08/14/2014  . Dehydration 10/02/2013  . Fever 10/02/2013  . Altered mental status 10/01/2013  . Encephalopathy, toxic 10/01/2013  . Encephalopathy, metabolic 44/07/270  . History of stroke 09/28/2013  . FTT (failure to thrive) in adult 09/28/2013  . Acute confusional state 09/28/2013  . Ulcer of sacral region, stage 3 ([place]) 09/26/2013  . S/P BKA (below knee amputation) bilateral ([place]) 09/08/2013  . ESRD on hemodialysis ([place]) 05/09/2013    . TIA (transient ischemic attack) 02/20/2013  . Acute blood loss anemia 10/28/2012  . Chronic combined systolic and diastolic congestive heart failure 10/28/2012  . Constipation 10/28/2012  . GI bleed 10/27/2012  . Mass in rectum 10/27/2012  . Type 2 diabetes mellitus with chronic kidney disease on chronic dialysis, with long-term current use of insulin ([place]) 09/08/2012  . ESRD (end stage renal disease) ([place]) 09/08/2012  . LVH (left ventricular hypertrophy)-severe concentric 06/13/2012  . Hyperlipidemia 01/30/2011  . CHOLELITHIASIS 08/01/2010  . BENIGN PROSTATIC HYPERTROPHY 08/01/2010  . CEREBROVASCULAR ACCIDENT, HX OF 08/06/2009  . Depression 03/18/2009  . NEPHROLITHIASIS, HX OF 03/18/2009  . Morbid obesity ([place]) 03/25/2007  . Hypertensive heart disease with CHF (congestive heart failure) ([place]) 03/25/2007  . GERD 03/25/2007  . Chronic hepatitis C without hepatic coma ([place]) 03/25/2007    Past Surgical History:  Procedure Laterality Date  . AMPUTATION Left 05/12/2013   Procedure: AMPUTATION RAY;  Surgeon: Newt Minion, MD;  Location: [facility];  Service: Orthopedics;  Laterality: Left;  Left Foot 1st Ray Amputation  . AMPUTATION Left 06/09/2013   Procedure: AMPUTATION BELOW KNEE;  Surgeon: Newt Minion, MD;  Location: [facility];  Service: Orthopedics;  Laterality: Left;  Left Below Knee Amputation and removal proximal screws IM tibial nail  . AMPUTATION Right 09/08/2013   Procedure: AMPUTATION BELOW KNEE;  Surgeon: Newt Minion, MD;  Location: [facility];  Service: Orthopedics;  Laterality: Right;  Right Below Knee Amputation  . AMPUTATION Right 10/11/2013   Procedure: AMPUTATION BELOW KNEE;  Surgeon: Newt Minion, MD;  Location: [facility];  Service: Orthopedics;  Laterality: Right;  Right Below Knee Amputation Revision  . AV FISTULA PLACEMENT  06/14/2012   Procedure: ARTERIOVENOUS (AV) FISTULA CREATION;  Surgeon: Angelia Mould, MD;  Location: [facility] OR;  Service: Vascular;  Laterality: Left;   Left basilic vein transposition with fistula.  [name] BASCILIC VEIN TRANSPOSITION Left 02/17/2017   Procedure: LEFT 1ST STAGE BRACHIAL VEIN TRANSPOSITION;  Surgeon: Conrad [place], MD;  Location: [facility];  Service: Vascular;  Laterality: Left;  . BASCILIC VEIN TRANSPOSITION Left 04/21/2017   Procedure: LEFT 2ND STAGE BRACHIAL VEIN TRANSPOSITION;  Surgeon: Conrad [place], MD;  Location: [facility];  Service: Vascular;  Laterality: Left;  . COLONOSCOPY N/A 10/28/2012   Procedure: COLONOSCOPY;  Surgeon: Jeryl Columbia, MD;  Location: [facility] ENDOSCOPY;  Service: Endoscopy;  Laterality: N/A;  . COLONOSCOPY N/A 11/02/2012   Procedure: COLONOSCOPY;  Surgeon: Cleotis Nipper, MD;  Location: [facility] ENDOSCOPY;  Service: Endoscopy;  Laterality: N/A;  . COLONOSCOPY N/A 11/03/2012   Procedure: COLONOSCOPY;  Surgeon: Cleotis Nipper, MD;  Location[facility] ENDOSCOPY;  Service: Endoscopy;  Laterality: N/A;  . COLONOSCOPY N/A 09/16/2016   Procedure: COLONOSCOPY;  Surgeon: Jerene Bears, MD;  Location: [facility] ENDOSCOPY;  Service: Gastroenterology;  Laterality: N/A;  . ENTEROSCOPY N/A 11/08/2012   Procedure: ENTEROSCOPY;  Surgeon: Wonda Horner, MD;  Location: [facility] ENDOSCOPY;  Service: Endoscopy;  Laterality: N/A;  . ESOPHAGOGASTRODUODENOSCOPY N/A 11/02/2012   Procedure: ESOPHAGOGASTRODUODENOSCOPY (EGD);  Surgeon: Cleotis Nipper, MD;  Location: [facility] ENDOSCOPY;  Service: Endoscopy;  Laterality: N/A;  . ESOPHAGOGASTRODUODENOSCOPY (EGD) WITH PROPOFOL N/A 09/16/2016   Procedure: ESOPHAGOGASTRODUODENOSCOPY (EGD) WITH PROPOFOL;  Surgeon: Jerene Bears, MD;  Location: [facility] ENDOSCOPY;  Service: Gastroenterology;  Laterality: N/A;  . EXCHANGE OF A DIALYSIS CATHETER Left 11/13/2016   Procedure: EXCHANGE OF A DIALYSIS CATHETER;  Surgeon: Elam Dutch, MD;  Location: [facility];  Service: Vascular;  Laterality: Left;  . EYE SURGERY Left    to remove scar tissue  . FRACTURE SURGERY    . GIVENS CAPSULE STUDY N/A 11/04/2012   Procedure: GIVENS CAPSULE STUDY;  Surgeon: Cleotis Nipper, MD;  Location: [facility] ENDOSCOPY;  Service: Endoscopy;  Laterality: N/A;  . GIVENS CAPSULE STUDY N/A 09/29/2016   Procedure: GIVENS CAPSULE STUDY;  Surgeon: Manus Gunning, MD;  Location: [facility];  Service: Gastroenterology;  Laterality: N/A;  try to keep pt up in bedside chair as much as possible during the study.    [name] HARDWARE REMOVAL Left 06/09/2013   Procedure: HARDWARE REMOVAL;  Surgeon: Newt Minion, MD;  Location: [facility];  Service: Orthopedics;  Laterality: Left;  Left Below Knee Amputation  and Removal proximal screws IM tibial nail  . INSERTION OF DIALYSIS CATHETER N/A 11/09/2016   Procedure: INSERTION OF TEMPORARY DIALYSIS CATHETER;  Surgeon: Elam Dutch, MD;  Location: [facility];  Service: Vascular;  Laterality: N/A;  . IR GENERIC HISTORICAL  09/27/2016   IR US GUIDE VASC ACCESS RIGHT 09/27/2016 Aletta Edouard, MD [facility]INTER[facility]  . IR GENERIC HISTORICAL  09/27/2016   IR FLUORO GUIDE CV LINE RIGHT 09/27/2016 Aletta Edouard, MD [facility]  . LIGATION OF ARTERIOVENOUS  FISTULA Left 11/09/2016   Procedure: LIGATION OF ARTERIOVENOUS  FISTULA;  Surgeon: Elam Dutch, MD;  Location: [facility];  Service: Vascular;  Laterality: Left;  . NEPHRECTOMY     partial RR  . ORIF FIBULA FRACTURE Left 09/09/2012   Procedure: OPEN REDUCTION INTERNAL FIXATION (ORIF) FIBULA FRACTURE;  Surgeon: Johnny Bridge, MD;  Location: [facility];  Service: Orthopedics;  Laterality: Left;  . TIBIA IM NAIL INSERTION Left 09/09/2012   Procedure: INTRAMEDULLARY (IM) NAIL TIBIAL;  Surgeon: Johnny Bridge, MD;  Location: [facility];  Service: Orthopedics;  Laterality: Left;  left tibial nail and open reduction internal fixation left fibula fracture  . UPPER EXTREMITY VENOGRAPHY N/A 12/14/2016   Procedure: Bilateral Upper Extremity Venography and Central Venography;  Surgeon: Conrad [place], MD;  Location: [facility] CV [facility];  Service: Cardiovascular;  Laterality: N/A;       Home Medications    Prior to Admission  medications   Medication Sig Start Date End Date Taking? Authorizing Provider  acetaminophen (TYLENOL) 500 MG tablet Take 500 mg by mouth every 6 (six) hours as needed for mild pain.    [provider]  amiodarone (PACERONE) 200 MG tablet TAKE 1 TABLET BY MOUTH DAILY, START THIS ON 11/06/16 11/02/16   Croitoru, Mihai, MD  atorvastatin (LIPITOR) 40 MG tablet TAKE 1 TABLET(40 MG) BY MOUTH DAILY AT 6 PM 12/02/16   [name], MD  calcium acetate (PHOSLO) 667 MG capsule Take 1,334 mg by mouth 3 (three) times daily  with meals.     [provider]  latanoprost (XALATAN) 0.005 % ophthalmic solution Place 1 drop into both eyes at bedtime.  06/12/16   [provider]  oxyCODONE-acetaminophen (ROXICET) 5-325 MG tablet Take 1 tablet by mouth every 4 (four) hours as needed for moderate pain. 31/4/97   Delora Fuel, MD  pantoprazole (PROTONIX) 40 MG tablet Take 1 tablet (40 mg total) by mouth 2 (two) times daily before a meal. 10/14/16   [name], MD  QUEtiapine (SEROQUEL) 25 MG tablet TAKE 1 TABLET(25 MG) BY MOUTH TWICE DAILY 01/05/17   [name], MD  SENSIPAR 30 MG tablet Take 30 mg by mouth every evening.  01/09/15   [provider]  silver sulfADIAZINE (SILVADENE) 1 % cream Apply 1 application topically daily as needed (irratation). 03/01/17   Suzan Slick, NP  SSD 1 % cream APPLY EXTERNALLY TO THE AFFECTED AREA DAILY 04/27/17   Newt Minion, MD  traMADol (ULTRAM) 50 MG tablet Take 50 mg by mouth every 6 (six) hours as needed for moderate pain.    [provider]  warfarin (COUMADIN) 2.5 MG tablet Take 1 tablet daily except 1.5 tablets on Wed and Fri or as directed by Anticoagulation Clinic Patient taking differently: Take 2.5-3.75 mg by mouth See admin instructions. Take 1 tablet daily except 1.5 tablets on Friday 12/28/16   Fay Records, MD    Family History Family History  Problem Relation Age of Onset  . Diabetes Mother   . Hypertension Mother   .  Heart attack Father   . Hypertension Father   . Coronary artery disease Other     Social History Social History  Substance Use Topics  . Smoking status: Never Smoker  . Smokeless tobacco: Never Used  . Alcohol use No     Allergies   No known allergies   Review of Systems Review of Systems  All other systems reviewed and are negative.    Physical Exam Updated Vital Signs BP 102/65 (BP Location: Right Arm)   Pulse 88   Temp 98.3 F (36.8 C) (Oral)   Resp 20   SpO2 100%   Physical Exam  Constitutional: He is oriented to person, place, and time. He appears well-developed and well-nourished. No distress.  HENT:  Head: Normocephalic and atraumatic.  Eyes: Pupils are equal, round, and reactive to light.  Cardiovascular: Normal rate.   Pulmonary/Chest: Effort normal.  Musculoskeletal:  Bilateral BKA.  Left upper arm with sutures intact over palpable pulse.  Area that was bleeding is not hemostatic and appears to be where a suture pulled away.  Mild erythema around the surgical site.  Induration around surgical site without drainage.  No gaping wound  Neurological: He is alert and oriented to person, place, and time.  Nursing note and vitals reviewed.    ED Treatments / Results  Labs (all labs ordered are listed, but only abnormal results are displayed) Labs Reviewed  PREPARE FRESH FROZEN PLASMA  TYPE AND SCREEN    EKG  EKG Interpretation None       Radiology No results found.  Procedures Procedures (including critical care time)  Medications Ordered in ED Medications  0.9 %  sodium chloride infusion (not administered)  ceFAZolin (ANCEF) IVPB 1 g/50 mL premix (not administered)     Initial Impression / Assessment and Plan / ED Course  I have reviewed the triage vital signs and the nursing notes.  Pertinent labs & imaging results that  were available during my care of the patient were reviewed by me and considered in my medical decision making (see  chart for details).     Patient returning due to bleeding of his AV fistula in his left upper arm. Currently a she is hemostatic. He does take Coumadin and INR earlier today was 2.5. Patient has not eaten or drank anything in the last 12 hours. Discussed patient with Dr. Bridgett Larsson who gave the patient and the option of surgery versus ongoing conservative treatment. Patient is opting for surgery at this point. Vascular surgery will come and see the patient. Plan for surgery later today. Patient was kept nothing by mouth. FFP transfusion started for Coumadin reversal per vascular surgery.  Final Clinical Impressions(s) / ED Diagnoses   Final diagnoses:  Bleeding from dialysis shunt, subsequent encounter    New Prescriptions New Prescriptions   No medications on file     Blanchie Dessert, MD 04/29/17 1141

## 2017-04-29 NOTE — Anesthesia Procedure Notes (Signed)
Procedure Name: Intubation Date/Time: 04/29/2017 2:23 PM Performed by: Trixie Deis A Pre-anesthesia Checklist: Patient identified, Emergency Drugs available, Suction available and Patient being monitored Patient Re-evaluated:Patient Re-evaluated prior to induction Oxygen Delivery Method: Circle System Utilized Preoxygenation: Pre-oxygenation with 100% oxygen Induction Type: IV induction Ventilation: Mask ventilation with difficulty and Oral airway inserted - appropriate to patient size Laryngoscope Size: Mac and 4 Grade View: Grade I Tube type: Oral Tube size: 7.5 mm Number of attempts: 1 Airway Equipment and Method: Bite block Placement Confirmation: positive ETCO2 Secured at: 23 cm Tube secured with: Tape Dental Injury: Teeth and Oropharynx as per pre-operative assessment

## 2017-04-29 NOTE — Anesthesia Procedure Notes (Signed)
Procedure Name: Intubation Date/Time: 04/29/2017 3:43 PM Performed by: Mervyn Gay Pre-anesthesia Checklist: Patient identified, Patient being monitored, Timeout performed, Emergency Drugs available and Suction available Patient Re-evaluated:Patient Re-evaluated prior to induction Oxygen Delivery Method: Circle System Utilized Preoxygenation: Pre-oxygenation with 100% oxygen Induction Type: IV induction Ventilation: Mask ventilation without difficulty Laryngoscope Size: Mac and 4 Grade View: Grade II Tube type: Subglottic suction tube Tube size: 8.0 mm Number of attempts: 1 Airway Equipment and Method: Stylet Placement Confirmation: ETT inserted through vocal cords under direct vision,  positive ETCO2 and breath sounds checked- equal and bilateral Secured at: 23 cm Tube secured with: Tape Dental Injury: Teeth and Oropharynx as per pre-operative assessment

## 2017-04-29 NOTE — Anesthesia Postprocedure Evaluation (Signed)
Anesthesia Post Note  Patient: Oscar Castillo  Procedure(s) Performed: EVACUATION HEMATOMA LEFT ARM (Left Arm Upper)     Patient location during evaluation: SICU Anesthesia Type: General Level of consciousness: sedated Pain management: pain level controlled Vital Signs Assessment: post-procedure vital signs reviewed and stable Respiratory status: patient re-intubated Cardiovascular status: bradycardic Postop Assessment: no apparent nausea or vomiting Anesthetic complications: no Comments: See quick note for code called in OR    Last Vitals:  Vitals:   04/29/17 1716 04/29/17 1721  BP: 102/67 100/68  Pulse: 85 93  Resp: 11 20  Temp:    SpO2: (!) 0% (!) 0%    Last Pain:  Vitals:   04/29/17 1304  TempSrc: Oral  PainSc:                  Cayla Wiegand S

## 2017-04-30 ENCOUNTER — Encounter (HOSPITAL_COMMUNITY)
Admission: EM | Disposition: A | Discharge status: Home Health | Payer: Self-pay | Source: Home / Self Care | Attending: Vascular Surgery

## 2017-04-30 ENCOUNTER — Inpatient Hospital Stay (HOSPITAL_COMMUNITY): Payer: Medicare Other

## 2017-04-30 ENCOUNTER — Telehealth: Payer: Self-pay | Admitting: Vascular Surgery

## 2017-04-30 ENCOUNTER — Encounter (HOSPITAL_COMMUNITY): Payer: Self-pay | Admitting: Vascular Surgery

## 2017-04-30 DIAGNOSIS — J9601 Acute respiratory failure with hypoxia: Secondary | ICD-10-CM

## 2017-04-30 DIAGNOSIS — R55 Syncope and collapse: Secondary | ICD-10-CM

## 2017-04-30 DIAGNOSIS — I4891 Unspecified atrial fibrillation: Secondary | ICD-10-CM

## 2017-04-30 DIAGNOSIS — I442 Atrioventricular block, complete: Principal | ICD-10-CM

## 2017-04-30 HISTORY — PX: PACEMAKER IMPLANT: EP1218

## 2017-04-30 LAB — CBC
HEMATOCRIT: 26.3 % — AB (ref 39.0–52.0)
HEMATOCRIT: 26.4 % — AB (ref 39.0–52.0)
HEMOGLOBIN: 8.5 g/dL — AB (ref 13.0–17.0)
Hemoglobin: 8.5 g/dL — ABNORMAL LOW (ref 13.0–17.0)
MCH: 27.4 pg (ref 26.0–34.0)
MCH: 27.6 pg (ref 26.0–34.0)
MCHC: 32.2 g/dL (ref 30.0–36.0)
MCHC: 32.3 g/dL (ref 30.0–36.0)
MCV: 84.8 fL (ref 78.0–100.0)
MCV: 85.7 fL (ref 78.0–100.0)
PLATELETS: 231 10*3/uL (ref 150–400)
PLATELETS: 242 10*3/uL (ref 150–400)
RBC: 3.08 MIL/uL — AB (ref 4.22–5.81)
RBC: 3.1 MIL/uL — AB (ref 4.22–5.81)
RDW: 17.1 % — ABNORMAL HIGH (ref 11.5–15.5)
RDW: 17.1 % — ABNORMAL HIGH (ref 11.5–15.5)
WBC: 5.4 10*3/uL (ref 4.0–10.5)
WBC: 7.6 10*3/uL (ref 4.0–10.5)

## 2017-04-30 LAB — GLUCOSE, CAPILLARY
GLUCOSE-CAPILLARY: 108 mg/dL — AB (ref 65–99)
GLUCOSE-CAPILLARY: 148 mg/dL — AB (ref 65–99)
GLUCOSE-CAPILLARY: 170 mg/dL — AB (ref 65–99)
Glucose-Capillary: 151 mg/dL — ABNORMAL HIGH (ref 65–99)
Glucose-Capillary: 115 mg/dL — ABNORMAL HIGH (ref 65–99)
Glucose-Capillary: 149 mg/dL — ABNORMAL HIGH (ref 65–99)

## 2017-04-30 LAB — COMPREHENSIVE METABOLIC PANEL
ALT: 15 U/L — AB (ref 17–63)
AST: 25 U/L (ref 15–41)
Albumin: 3 g/dL — ABNORMAL LOW (ref 3.5–5.0)
Alkaline Phosphatase: 53 U/L (ref 38–126)
Anion gap: 17 — ABNORMAL HIGH (ref 5–15)
BILIRUBIN TOTAL: 0.6 mg/dL (ref 0.3–1.2)
BUN: 76 mg/dL — AB (ref 6–20)
CHLORIDE: 99 mmol/L — AB (ref 101–111)
CO2: 18 mmol/L — ABNORMAL LOW (ref 22–32)
CREATININE: 10.71 mg/dL — AB (ref 0.61–1.24)
Calcium: 9 mg/dL (ref 8.9–10.3)
GFR calc Af Amer: 5 mL/min — ABNORMAL LOW (ref >60)
GFR, EST NON AFRICAN AMERICAN: 4 mL/min — AB (ref >60)
Glucose, Bld: 168 mg/dL — ABNORMAL HIGH (ref 65–99)
Potassium: 6 mmol/L — ABNORMAL HIGH (ref 3.5–5.1)
Sodium: 134 mmol/L — ABNORMAL LOW (ref 135–145)
TOTAL PROTEIN: 6.9 g/dL (ref 6.5–8.1)

## 2017-04-30 LAB — BPAM FFP
BLOOD PRODUCT EXPIRATION DATE: 201810062359
Blood Product Expiration Date: 201810052359
Blood Product Expiration Date: 201810052359
Blood Product Expiration Date: 201810062359
ISSUE DATE / TIME: 201810041221
ISSUE DATE / TIME: 201810041221
ISSUE DATE / TIME: 201810041405
ISSUE DATE / TIME: 201810041405
UNIT TYPE AND RH: 6200
UNIT TYPE AND RH: 7300
Unit Type and Rh: 6200
Unit Type and Rh: 6200

## 2017-04-30 LAB — BASIC METABOLIC PANEL
Anion gap: 15 (ref 5–15)
BUN: 75 mg/dL — ABNORMAL HIGH (ref 6–20)
CO2: 17 mmol/L — ABNORMAL LOW (ref 22–32)
Calcium: 9 mg/dL (ref 8.9–10.3)
Chloride: 101 mmol/L (ref 101–111)
Creatinine, Ser: 10.85 mg/dL — ABNORMAL HIGH (ref 0.61–1.24)
GFR calc Af Amer: 5 mL/min — ABNORMAL LOW (ref >60)
GFR calc non Af Amer: 4 mL/min — ABNORMAL LOW (ref >60)
Glucose, Bld: 115 mg/dL — ABNORMAL HIGH (ref 65–99)
Potassium: 5.7 mmol/L — ABNORMAL HIGH (ref 3.5–5.1)
Sodium: 133 mmol/L — ABNORMAL LOW (ref 135–145)

## 2017-04-30 LAB — SURGICAL PCR SCREEN
MRSA, PCR: NEGATIVE
MRSA, PCR: NEGATIVE
STAPHYLOCOCCUS AUREUS: NEGATIVE
Staphylococcus aureus: NEGATIVE

## 2017-04-30 LAB — PREPARE FRESH FROZEN PLASMA
UNIT DIVISION: 0
UNIT DIVISION: 0
Unit division: 0
Unit division: 0

## 2017-04-30 LAB — PROTIME-INR
INR: 2.47
INR: 2.59
PROTHROMBIN TIME: 26.5 s — AB (ref 11.4–15.2)
Prothrombin Time: 27.6 seconds — ABNORMAL HIGH (ref 11.4–15.2)

## 2017-04-30 LAB — TSH: TSH: 1.469 u[IU]/mL (ref 0.350–4.500)

## 2017-04-30 LAB — ECHOCARDIOGRAM COMPLETE
Height: 70 in
Weight: 3213.42 oz

## 2017-04-30 LAB — MRSA PCR SCREENING: MRSA by PCR: NEGATIVE

## 2017-04-30 LAB — TROPONIN I
TROPONIN I: 0.29 ng/mL — AB (ref <0.03)
Troponin I: 0.21 ng/mL (ref <0.03)

## 2017-04-30 LAB — HIV ANTIBODY (ROUTINE TESTING W REFLEX): HIV Screen 4th Generation wRfx: NONREACTIVE

## 2017-04-30 SURGERY — PACEMAKER IMPLANT
Anesthesia: LOCAL

## 2017-04-30 MED ORDER — SODIUM CHLORIDE 0.9 % IV SOLN: 100.0000 mL | INTRAVENOUS | Status: DC | PRN

## 2017-04-30 MED ORDER — ALTEPLASE 2 MG IJ SOLR: 2.0000 mg | Freq: Once | INTRAMUSCULAR | Status: DC | PRN

## 2017-04-30 MED ORDER — HEPARIN (PORCINE) IN NACL 2-0.9 UNIT/ML-% IJ SOLN
INTRAMUSCULAR | Status: AC | PRN
  Administered 2017-04-30: 500 mL

## 2017-04-30 MED ORDER — CEFAZOLIN SODIUM-DEXTROSE 2-4 GM/100ML-% IV SOLN
INTRAVENOUS | Status: AC
  Filled 2017-04-30: qty 100

## 2017-04-30 MED ORDER — OXYCODONE HCL 5 MG PO TABS
10.0000 mg | ORAL_TABLET | Freq: Once | ORAL | Status: AC
  Administered 2017-04-30: 10 mg via ORAL

## 2017-04-30 MED ORDER — ACETAMINOPHEN 325 MG PO TABS: 325.0000 mg | ORAL_TABLET | ORAL | Status: DC | PRN

## 2017-04-30 MED ORDER — HEPARIN (PORCINE) IN NACL 2-0.9 UNIT/ML-% IJ SOLN
INTRAMUSCULAR | Status: AC
  Filled 2017-04-30: qty 500

## 2017-04-30 MED ORDER — LIDOCAINE HCL (PF) 1 % IJ SOLN
INTRAMUSCULAR | Status: DC | PRN
  Administered 2017-04-30: 50 mL

## 2017-04-30 MED ORDER — GENTAMICIN SULFATE 40 MG/ML IJ SOLN
INTRAMUSCULAR | Status: AC
  Filled 2017-04-30: qty 2

## 2017-04-30 MED ORDER — ONDANSETRON HCL 4 MG/2ML IJ SOLN: 4.0000 mg | Freq: Four times a day (QID) | INTRAMUSCULAR | Status: DC | PRN

## 2017-04-30 MED ORDER — CEFAZOLIN SODIUM-DEXTROSE 1-4 GM/50ML-% IV SOLN: 1.0000 g | Freq: Four times a day (QID) | INTRAVENOUS | Status: DC

## 2017-04-30 MED ORDER — PENTAFLUOROPROP-TETRAFLUOROETH EX AERO: 1.0000 "application " | INHALATION_SPRAY | CUTANEOUS | Status: DC | PRN

## 2017-04-30 MED ORDER — BUPIVACAINE HCL (PF) 0.25 % IJ SOLN
INTRAMUSCULAR | Status: AC
  Filled 2017-04-30: qty 60

## 2017-04-30 MED ORDER — DEXTROSE 5 % IV SOLN
750.0000 mg | INTRAVENOUS | Status: DC
  Administered 2017-05-01: 750 mg via INTRAVENOUS
  Filled 2017-04-30 (×3): qty 750

## 2017-04-30 MED ORDER — CHLORHEXIDINE GLUCONATE 0.12 % MT SOLN: 15.0000 mL | Freq: Two times a day (BID) | OROMUCOSAL | Status: DC

## 2017-04-30 MED ORDER — HEPARIN SODIUM (PORCINE) 1000 UNIT/ML DIALYSIS: 1000.0000 [IU] | INTRAMUSCULAR | Status: DC | PRN

## 2017-04-30 MED ORDER — ORAL CARE MOUTH RINSE: 15.0000 mL | Freq: Two times a day (BID) | OROMUCOSAL | Status: DC

## 2017-04-30 MED ORDER — FENTANYL CITRATE (PF) 100 MCG/2ML IJ SOLN: 12.5000 ug | INTRAMUSCULAR | Status: DC | PRN

## 2017-04-30 MED ORDER — CHLORHEXIDINE GLUCONATE 4 % EX LIQD: 60.0000 mL | Freq: Once | CUTANEOUS | Status: DC

## 2017-04-30 MED ORDER — DARBEPOETIN ALFA 60 MCG/0.3ML IJ SOSY
PREFILLED_SYRINGE | INTRAMUSCULAR | Status: AC
  Administered 2017-04-30: 60 ug via INTRAVENOUS
  Filled 2017-04-30: qty 0.3

## 2017-04-30 MED ORDER — SODIUM CHLORIDE 0.9 % IV SOLN
INTRAVENOUS | Status: DC
  Administered 2017-04-30: 16:00:00 via INTRAVENOUS

## 2017-04-30 MED ORDER — SODIUM CHLORIDE 0.9 % IR SOLN
80.0000 mg | Status: AC
  Administered 2017-04-30: 80 mg
  Filled 2017-04-30: qty 2

## 2017-04-30 MED ORDER — DARBEPOETIN ALFA 60 MCG/0.3ML IJ SOSY
60.0000 ug | PREFILLED_SYRINGE | Freq: Once | INTRAMUSCULAR | Status: AC
  Administered 2017-04-30: 60 ug via INTRAVENOUS
  Filled 2017-04-30: qty 0.3

## 2017-04-30 MED ORDER — LIDOCAINE-PRILOCAINE 2.5-2.5 % EX CREA
1.0000 "application " | TOPICAL_CREAM | CUTANEOUS | Status: DC | PRN
  Filled 2017-04-30: qty 5

## 2017-04-30 MED ORDER — VITAMIN K1 10 MG/ML IJ SOLN
1.0000 mg | Freq: Once | INTRAVENOUS | Status: AC
  Administered 2017-04-30: 1 mg via INTRAVENOUS
  Filled 2017-04-30: qty 0.1

## 2017-04-30 MED ORDER — LIDOCAINE HCL (PF) 1 % IJ SOLN: 5.0000 mL | INTRAMUSCULAR | Status: DC | PRN

## 2017-04-30 MED ORDER — CHLORHEXIDINE GLUCONATE 4 % EX LIQD
CUTANEOUS | Status: AC
  Administered 2017-04-30: 1
  Filled 2017-04-30: qty 30

## 2017-04-30 MED ORDER — CHLORHEXIDINE GLUCONATE 4 % EX LIQD
60.0000 mL | Freq: Once | CUTANEOUS | Status: AC
  Administered 2017-04-30: 4 via TOPICAL

## 2017-04-30 MED ORDER — CEFAZOLIN SODIUM-DEXTROSE 2-4 GM/100ML-% IV SOLN
2.0000 g | INTRAVENOUS | Status: AC
  Administered 2017-04-30: 2 g via INTRAVENOUS
  Filled 2017-04-30: qty 100

## 2017-04-30 MED ORDER — CEFAZOLIN SODIUM-DEXTROSE 1-4 GM/50ML-% IV SOLN
1.0000 g | Freq: Four times a day (QID) | INTRAVENOUS | Status: DC
  Administered 2017-04-30 – 2017-05-01 (×2): 1 g via INTRAVENOUS
  Filled 2017-04-30 (×3): qty 50

## 2017-04-30 MED FILL — Amiodarone HCl Inj 150 MG/3ML (50 MG/ML): INTRAVENOUS | Qty: 3 | Status: AC

## 2017-04-30 MED FILL — Lidocaine HCl Local Inj 2%: INTRAMUSCULAR | Qty: 10 | Status: AC

## 2017-04-30 SURGICAL SUPPLY — 8 items
CABLE SURGICAL S-101-97-12 (CABLE) ×1 IMPLANT
IPG PACE AZUR XT DR MRI W1DR01 (Pacemaker) IMPLANT
LEAD CAPSURE NOVUS 45CM (Lead) ×1 IMPLANT
LEAD CAPSURE NOVUS 5076-52CM (Lead) ×1 IMPLANT
PACE AZURE XT DR MRI W1DR01 (Pacemaker) ×2 IMPLANT
PAD DEFIB LIFELINK (PAD) ×1 IMPLANT
SHEATH CLASSIC 7F (SHEATH) ×2 IMPLANT
TRAY PACEMAKER INSERTION (PACKS) ×1 IMPLANT

## 2017-04-30 NOTE — Progress Notes (Signed)
   Daily Progress Note   Assessment/Planning:   POD #1 s/p L arm hematoma evacuation complicated by complete heart block   Appreciate Cardiology's mgmt of this patient's complete heart block  Pt remains intubated to facilitate Cardiology placement of PPM  Pt's INR was >2.5 today even with 3 u FFP suggesting his coumadin loading has been overly aggressive, which likely resulted in the L arm hematoma post-op  Changed L arm bandages as need.  Continued JP.  Expect drain will need to be in place for days.   Subjective  - Day of Surgery   Intubated, somewhat sedated   Objective   Vitals:   04/30/17 1600 04/30/17 1615 04/30/17 1630 04/30/17 1653  BP: 113/67 124/77 102/64   Pulse: 77 83    Resp: 13 19 13    Temp:      TempSrc:      SpO2: 100% 100%  100%  Weight:         Intake/Output Summary (Last 24 hours) at 04/30/17 1702 Last data filed at 04/30/17 1600  Gross per 24 hour  Intake          3033.56 ml  Output               75 ml  Net          2958.56 ml    PULM  intubated  CV  RRR, paced on monitor  GI  soft, NTND  VASC L arm bandaged, JP in place, 150 cc serosang in JP bulb  NEURO sedated    Laboratory   CBC CBC Latest Ref Rng & Units 04/30/2017 04/30/2017 04/29/2017  WBC 4.0 - 10.5 K/uL 7.6 5.4 7.1  Hemoglobin 13.0 - 17.0 g/dL 8.5(L) 8.5(L) 10.6(L)  Hematocrit 39.0 - 52.0 % 26.3(L) 26.4(L) 32.8(L)  Platelets 150 - 400 K/uL 231 242 249    BMET    Component Value Date/Time   NA 133 (L) 04/30/2017 0550   K 5.7 (H) 04/30/2017 0550   CL 101 04/30/2017 0550   CO2 17 (L) 04/30/2017 0550   GLUCOSE 115 (H) 04/30/2017 0550   BUN 75 (H) 04/30/2017 0550   CREATININE 10.85 (H) 04/30/2017 0550   CREATININE 8.97 (H) 10/07/2015 1500   CALCIUM 9.0 04/30/2017 0550   GFRNONAA 4 (L) 04/30/2017 0550   GFRNONAA 6 (L) 10/07/2015 1500   GFRAA 5 (L) 04/30/2017 0550   GFRAA 6 (L) 10/07/2015 1500     Adele Barthel, MD, FACS [facility] Office: [phone] Pager: [phone]  04/30/2017, 5:02 PM

## 2017-04-30 NOTE — Progress Notes (Signed)
Dialysis treatment completed.  500 mL ultrafiltrated.  0 mL net fluid removal.  Patient A & O X 4. Lung sounds diminished to ausculation in all fields. No edema. Cardiac: V-paced.  Cleansed LIJ catheter with chlorhexidine.  Disconnected lines and flushed ports with saline per protocol.  Ports locked with heparin and capped per protocol.  Dressing changed.  Report given to bedside, RN Melissa.

## 2017-04-30 NOTE — Care Management Note (Signed)
Case Management Note  Patient Details  Name: GARRELL FLAGG MRN: [id number] Date of Birth: Jun 19, 1951  Subjective/Objective:    From home, POD 1  s/p L arm hematoma evacuation complicated by complete heart block.                Action/Plan: NCM will follow for dc needs.   Expected Discharge Date:  04/30/17               Expected Discharge Plan:     In-House Referral:     Discharge planning Services  CM Consult  Post Acute Care Choice:    Choice offered to:     DME Arranged:    DME Agency:     HH Arranged:    HH Agency:     Status of Service:  In process, will continue to follow  If discussed at Long Length of Stay Meetings, dates discussed:    Additional Comments:  Zenon Mayo, RN 04/30/2017, 5:16 PM

## 2017-04-30 NOTE — Progress Notes (Signed)
  Echocardiogram 2D Echocardiogram has been performed.  Oscar Castillo 04/30/2017, 12:47 PM

## 2017-04-30 NOTE — Progress Notes (Signed)
PULMONARY / CRITICAL CARE MEDICINE   Name: Oscar Castillo MRN: [id number] DOB: 05/09/1951    ADMISSION DATE:  04/29/2017 CONSULTATION DATE:  04/29/2017  REFERRING MD:  Bridgett Larsson  CHIEF COMPLAINT:  VT arrest  HISTORY OF PRESENT ILLNESS:   66 y/o male underwent a left arm hematoma evacuation near a recently placed AV fistula on 10/4 and developed complete heart block and VT.    SUBJECTIVE:  Had temporary pacer placed overnight  VITAL SIGNS: BP (!) 84/62   Pulse 82   Temp 98.3 F (36.8 C) (Axillary)   Resp (!) 25   Wt 197 lb 12 oz (89.7 kg)   SpO2 100%   BMI 28.37 kg/m   HEMODYNAMICS:    VENTILATOR SETTINGS: Vent Mode: PRVC FiO2 (%):  [40 %] 40 % Set Rate:  [25 bmp] 25 bmp Vt Set:  [600 mL] 600 mL PEEP:  [5 cmH20] 5 cmH20 Plateau Pressure:  [17 cmH20-19 cmH20] 17 cmH20  INTAKE / OUTPUT: I/O last 3 completed shifts: In: 3144.7 [I.V.:1538.7; Blood:1556; IV Piggyback:50] Out: 225 [Drains:75; Blood:150]  PHYSICAL EXAMINATION:  General:  In bed on vent HENT: NCAT ETT in place PULM: CTA B, vent supported breathing CV: RRR, no mgr GI: BS+, soft, nontender MSK: normal bulk and tone, L arm bandage in place Neuro: awake, follows commands     LABS:  BMET  Recent Labs Lab 04/29/17 1600 04/29/17 2009 04/30/17 0006  NA 132* 135 134*  K 5.0 5.2* 6.0*  CL 99* 100* 99*  CO2 20* 20* 18*  BUN 70* 74* 76*  CREATININE 10.35* 10.53* 10.71*  GLUCOSE 234* 172* 168*    Electrolytes  Recent Labs Lab 04/29/17 1600 04/29/17 2009 04/30/17 0006  CALCIUM 10.6* 9.1 9.0    CBC  Recent Labs Lab 04/29/17 0308 04/30/17 0006 04/30/17 0550  WBC 7.1 5.4 7.6  HGB 10.6* 8.5* 8.5*  HCT 32.8* 26.4* 26.3*  PLT 249 242 231    Coag's  Recent Labs Lab 04/29/17 1600 04/30/17 0006 04/30/17 0550  INR 2.06 2.47 2.59    Sepsis Markers  Recent Labs Lab 04/29/17 0400 04/29/17 0609  LATICACIDVEN 2.13* 1.35    ABG  Recent Labs Lab 04/29/17 1810  PHART 7.377   PCO2ART 33.0  PO2ART 421*    Liver Enzymes  Recent Labs Lab 04/29/17 0308 04/30/17 0006  AST 17 25  ALT 12* 15*  ALKPHOS 62 53  BILITOT 0.5 0.6  ALBUMIN 3.5 3.0*    Cardiac Enzymes  Recent Labs Lab 04/29/17 2009 04/30/17 0006  TROPONINI 0.12* 0.21*    Glucose  Recent Labs Lab 04/29/17 2148 04/30/17 0002 04/30/17 0401  GLUCAP 185* 170* 148*    Imaging Dg Chest Port 1 View  Result Date: 04/29/2017 CLINICAL DATA:  Status post intubation. EXAM: PORTABLE CHEST 1 VIEW COMPARISON:  11/13/2016 FINDINGS: Left subclavian dialysis catheter is noted with tip in the low right atrium. Status post ETT tip. The tip of the ET tube appears to be 2.4 cm above the carina. Normal heart size. There is no pleural effusion or edema. No airspace opacities. IMPRESSION: Tip of the ET tube is 2.4 cm above the carina. Electronically Signed   By: Kerby Moors M.D.   On: 04/29/2017 20:23     STUDIES:    CULTURES:   ANTIBIOTICS:   SIGNIFICANT EVENTS: 9/26 second stage vein transposition (AV fistula) 10/4 evacuation of left arm hematoma  LINES/TUBES: 10/4 ETT  10/4 temp pacer  DISCUSSION: 66 y/o male with ESRD, DM2,  Hep C who had a temporary pacer placed 10/4 after developing intraoperative heart block.  Intubated around the time of VT on 10/4.    ASSESSMENT / PLAN:  PULMONARY A: Acute respiratory failure P:   Extubate today SLP precautions afterwards  CARDIOVASCULAR A:  Complete heart block VT on 10/5 P:  Tele Pacer per cardiology Amiodarone per cardiology  RENAL A:   ESRD Left arm AVF fistula hematoma P:   HD per renal Access per vascular surgery Drain/wound management per vascular surgery  GASTROINTESTINAL A:   No acute issues P:   Advance diet post pacemaker 10/5  HEMATOLOGIC A:   Hematoma L arm P:  Monitor hemoglobin Transfuse if Hgb < 8gm/dL  INFECTIOUS A:   No acute issues P:   Monitor for fever  ENDOCRINE A:   DM2   P:    Monitor blood sugar  NEUROLOGIC A:   No acute issues P:   Stop sedation protocol   FAMILY  - Updates: none bedside  -My cc time 30 minutes  Roselie Awkward, MD [facility] Pager: [phone] Cell: [phone] After 3pm or if no response, call [phone]   04/30/2017, 7:12 AM

## 2017-04-30 NOTE — Progress Notes (Addendum)
Oscar Castillo presented urgently for placement of a temporary transvenous pacemaker for complete heart block. He did have bradycardia and VF requiring defibrillation followed by heart block and hypotension. He is a dialysis patient however his potassium was normal. His INR was not supratherapeutic. I anesthetized his right groin and placed a sheath in his right common femoral vein. I then floated a balloon tipped temperature is pacing lead to the RV apex and demonstrated capture at 0.4 mA. The patient tolerated procedure well without complication  .Lorretta Harp, M.D., [place], [facility], [facility] [place] [facility] [address] [place], [place]  [id number]  [phone] 04/30/2017 12:31 PM

## 2017-04-30 NOTE — Consult Note (Signed)
[facility] Renal Consultation Note    Indication for Consultation:  Management of ESRD/hemodialysis; anemia, hypertension/volume and secondary hyperparathyroidism PCP: [name], MD  HPI: Oscar Castillo is a 66 y.o. male with ESRD on hemodialysis T,Th, S at [facility] PMH significant DM/HTN, AFib RVR on coumadin, chronic systolic and diastolic HF, CVA, obesity, LBBB, Hep C GIB.  Patient was admitted for revision of AVF 04/29/17 per Dr. Bridgett Larsson. Patient consequently developed CHB/VT in the presence of mild hyperkalemia. He required intubation and placement of temporary pacing wire. He was extubated this AM per PCCM and plans are in place for permanent pacemaker this afternoon following dialysis.   Currently he awake, alert, oriented. He C/O sore throat, no chest pain, no SOB. T-pacing with rate of 80, good capture on monitor. He denies pain, discomfort, abdominal pain, fevers, chills, N, V,D. Upset at noise at unit but otherwise stable. Wife at bedside.   Patient has HD at [facility]. High IDWG, has been leaving above EDW. Probably needs longer HD time. Labs in HD center have been relatively stable with hyperphosphatemia.   Past Medical History:  Diagnosis Date  . Anemia   . Antral ulcer 2014   small  . Atrial fibrillation ([place])   . Atrial fibrillation with RVR ([place]) 10/07/2016  . BACK PAIN, LUMBAR, CHRONIC   . BENIGN PROSTATIC HYPERTROPHY   . Bipolar disorder ([place])    "sometimes" (10/07/2016)  . CHOLELITHIASIS   . Chronic combined systolic and diastolic CHF (congestive heart failure) ([place])   . Complication of anesthesia    wife states pt had trouble waking up with in Nov., 2014  . CVA (cerebral vascular accident) ([place]) 07/2007  . DEPRESSION   . DIABETES MELLITUS, TYPE II    diet control  . ERECTILE DYSFUNCTION   . ESRD (end stage renal disease) on dialysis [facility])    "[place]; TTS" (10/07/2016)  . ESRD on hemodialysis ([place])    ESRD due to DM/HTN. Started  dialysis in November 2013.  HD TTS at [facility] [address].  [name] GERD   . GI bleed    due to gastritis, discharged 10/02/16/notes 10/07/2016  . Headache    "monthly" (10/07/2016)  . Hemorrhoids   . Hepatitis C    C - has been treated  . History of blood transfusion    "related to dialysis" (10/07/2016)  . History of Clostridium difficile   . History of kidney stones   . HYPERTENSION   . LBBB (left bundle branch block)   . Morbid obesity ([place])   . PAF (paroxysmal atrial fibrillation) ([place])    a. Dx 12/2015.   Past Surgical History:  Procedure Laterality Date  . AMPUTATION Left 05/12/2013   Procedure: AMPUTATION RAY;  Surgeon: Newt Minion, MD;  Location: [place];  Service: Orthopedics;  Laterality: Left;  Left Foot 1st Ray Amputation  . AMPUTATION Left 06/09/2013   Procedure: AMPUTATION BELOW KNEE;  Surgeon: Newt Minion, MD;  Location: [place];  Service: Orthopedics;  Laterality: Left;  Left Below Knee Amputation and removal proximal screws IM tibial nail  . AMPUTATION Right 09/08/2013   Procedure: AMPUTATION BELOW KNEE;  Surgeon: Newt Minion, MD;  Location: [place];  Service: Orthopedics;  Laterality: Right;  Right Below Knee Amputation  . AMPUTATION Right 10/11/2013   Procedure: AMPUTATION BELOW KNEE;  Surgeon: Newt Minion, MD;  Location: [place];  Service: Orthopedics;  Laterality: Right;  Right Below Knee Amputation Revision  .  AV FISTULA PLACEMENT  06/14/2012   Procedure: ARTERIOVENOUS (AV) FISTULA CREATION;  Surgeon: Angelia Mould, MD;  Location[facility] OR;  Service: Vascular;  Laterality: Left;  Left basilic vein transposition with fistula.  [name] BASCILIC VEIN TRANSPOSITION Left 02/17/2017   Procedure: LEFT 1ST STAGE BRACHIAL VEIN TRANSPOSITION;  Surgeon: Conrad Cobb, MD;  Location: [place];  Service: Vascular;  Laterality: Left;  . BASCILIC VEIN TRANSPOSITION Left 04/21/2017   Procedure: LEFT 2ND STAGE BRACHIAL VEIN TRANSPOSITION;  Surgeon: Conrad [place], MD;  Location: [place];   Service: Vascular;  Laterality: Left;  . COLONOSCOPY N/A 10/28/2012   Procedure: COLONOSCOPY;  Surgeon: Jeryl Columbia, MD;  Location: [facility] ENDOSCOPY;  Service: Endoscopy;  Laterality: N/A;  . COLONOSCOPY N/A 11/02/2012   Procedure: COLONOSCOPY;  Surgeon: Cleotis Nipper, MD;  Location: [facility] ENDOSCOPY;  Service: Endoscopy;  Laterality: N/A;  . COLONOSCOPY N/A 11/03/2012   Procedure: COLONOSCOPY;  Surgeon: Cleotis Nipper, MD;  Location: [facility] ENDOSCOPY;  Service: Endoscopy;  Laterality: N/A;  . COLONOSCOPY N/A 09/16/2016   Procedure: COLONOSCOPY;  Surgeon: Jerene Bears, MD;  Location: [facility] ENDOSCOPY;  Service: Gastroenterology;  Laterality: N/A;  . ENTEROSCOPY N/A 11/08/2012   Procedure: ENTEROSCOPY;  Surgeon: Wonda Horner, MD;  Location: [facility] ENDOSCOPY;  Service: Endoscopy;  Laterality: N/A;  . ESOPHAGOGASTRODUODENOSCOPY N/A 11/02/2012   Procedure: ESOPHAGOGASTRODUODENOSCOPY (EGD);  Surgeon: Cleotis Nipper, MD;  Location: [facility] ENDOSCOPY;  Service: Endoscopy;  Laterality: N/A;  . ESOPHAGOGASTRODUODENOSCOPY (EGD) WITH PROPOFOL N/A 09/16/2016   Procedure: ESOPHAGOGASTRODUODENOSCOPY (EGD) WITH PROPOFOL;  Surgeon: Jerene Bears, MD;  Location: [facility] ENDOSCOPY;  Service: Gastroenterology;  Laterality: N/A;  . EXCHANGE OF A DIALYSIS CATHETER Left 11/13/2016   Procedure: EXCHANGE OF A DIALYSIS CATHETER;  Surgeon: Elam Dutch, MD;  Location: [place];  Service: Vascular;  Laterality: Left;  . EYE SURGERY Left    to remove scar tissue  . FRACTURE SURGERY    . GIVENS CAPSULE STUDY N/A 11/04/2012   Procedure: GIVENS CAPSULE STUDY;  Surgeon: Cleotis Nipper, MD;  Location: [facility] ENDOSCOPY;  Service: Endoscopy;  Laterality: N/A;  . GIVENS CAPSULE STUDY N/A 09/29/2016   Procedure: GIVENS CAPSULE STUDY;  Surgeon: Manus Gunning, MD;  Location: [place];  Service: Gastroenterology;  Laterality: N/A;  try to keep pt up in bedside chair as much as possible during the study.    [name] HARDWARE REMOVAL Left 06/09/2013   Procedure:  HARDWARE REMOVAL;  Surgeon: Newt Minion, MD;  Location: [place];  Service: Orthopedics;  Laterality: Left;  Left Below Knee Amputation  and Removal proximal screws IM tibial nail  . HEMATOMA EVACUATION Left 04/29/2017   Procedure: EVACUATION HEMATOMA LEFT ARM;  Surgeon: Conrad Merrill, MD;  Location: [place];  Service: Vascular;  Laterality: Left;  . INSERTION OF DIALYSIS CATHETER N/A 11/09/2016   Procedure: INSERTION OF TEMPORARY DIALYSIS CATHETER;  Surgeon: Elam Dutch, MD;  Location: [place];  Service: Vascular;  Laterality: N/A;  . IR GENERIC HISTORICAL  09/27/2016   IR US GUIDE VASC ACCESS RIGHT 09/27/2016 Aletta Edouard, MD [facility]  . IR GENERIC HISTORICAL  09/27/2016   IR FLUORO GUIDE CV LINE RIGHT 09/27/2016 Aletta Edouard, MD [facility]  . LIGATION OF ARTERIOVENOUS  FISTULA Left 11/09/2016   Procedure: LIGATION OF ARTERIOVENOUS  FISTULA;  Surgeon: Elam Dutch, MD;  Location: [place];  Service: Vascular;  Laterality: Left;  . NEPHRECTOMY     partial RR  . ORIF FIBULA FRACTURE  Left 09/09/2012   Procedure: OPEN REDUCTION INTERNAL FIXATION (ORIF) FIBULA FRACTURE;  Surgeon: Johnny Bridge, MD;  Location: [place];  Service: Orthopedics;  Laterality: Left;  . TIBIA IM NAIL INSERTION Left 09/09/2012   Procedure: INTRAMEDULLARY (IM) NAIL TIBIAL;  Surgeon: Johnny Bridge, MD;  Location: [place];  Service: Orthopedics;  Laterality: Left;  left tibial nail and open reduction internal fixation left fibula fracture  . UPPER EXTREMITY VENOGRAPHY N/A 12/14/2016   Procedure: Bilateral Upper Extremity Venography and Central Venography;  Surgeon: Conrad [place], MD;  Location: [place] CV LAB;  Service: Cardiovascular;  Laterality: N/A;   Family History  Problem Relation Age of Onset  . Diabetes Mother   . Hypertension Mother   . Heart attack Father   . Hypertension Father   . Coronary artery disease Other    Social History:  reports that he has never smoked. He has never used smokeless tobacco. He  reports that he does not drink alcohol or use drugs. Allergies  Allergen Reactions  . No Known Allergies    Prior to Admission medications   Medication Sig Start Date End Date Taking? Authorizing Provider  acetaminophen (TYLENOL) 500 MG tablet Take 500 mg by mouth every 6 (six) hours as needed for mild pain.    [provider]  amiodarone (PACERONE) 200 MG tablet TAKE 1 TABLET BY MOUTH DAILY, START THIS ON 11/06/16 11/02/16   Croitoru, Mihai, MD  atorvastatin (LIPITOR) 40 MG tablet TAKE 1 TABLET(40 MG) BY MOUTH DAILY AT 6 PM 12/02/16   [name], MD  calcium acetate (PHOSLO) 667 MG capsule Take 1,334 mg by mouth 3 (three) times daily with meals.     [provider]  latanoprost (XALATAN) 0.005 % ophthalmic solution Place 1 drop into both eyes at bedtime.  06/12/16   [provider]  oxyCODONE-acetaminophen (ROXICET) 5-325 MG tablet Take 1 tablet by mouth every 4 (four) hours as needed for moderate pain. 18/8/41   Delora Fuel, MD  pantoprazole (PROTONIX) 40 MG tablet Take 1 tablet (40 mg total) by mouth 2 (two) times daily before a meal. 10/14/16   [name], MD  QUEtiapine (SEROQUEL) 25 MG tablet TAKE 1 TABLET(25 MG) BY MOUTH TWICE DAILY 01/05/17   [name], MD  SENSIPAR 30 MG tablet Take 30 mg by mouth every evening.  01/09/15   [provider]  silver sulfADIAZINE (SILVADENE) 1 % cream Apply 1 application topically daily as needed (irratation). 03/01/17   Suzan Slick, NP  SSD 1 % cream APPLY EXTERNALLY TO THE AFFECTED AREA DAILY 04/27/17   Newt Minion, MD  traMADol (ULTRAM) 50 MG tablet Take 50 mg by mouth every 6 (six) hours as needed for moderate pain.    [provider]  warfarin (COUMADIN) 2.5 MG tablet Take 1 tablet daily except 1.5 tablets on Wed and Fri or as directed by Anticoagulation Clinic Patient taking differently: Take 2.5-3.75 mg by mouth See admin instructions. Take 1 tablet daily except 1.5 tablets on Friday 12/28/16   Fay Records, MD   Current Facility-Administered Medications  Medication Dose Route Frequency Provider Last Rate Last Dose  . 0.9 %  sodium chloride infusion   Intravenous Continuous Conrad [place], MD 50 mL/hr at 04/29/17 2116    . 0.9 %  sodium chloride infusion  250 mL Intravenous PRN Lorretta Harp, MD      . acetaminophen (TYLENOL) tablet 650 mg  650 mg Oral  Q4H PRN Lorretta Harp, MD      . alum & mag hydroxide-simeth (MAALOX/MYLANTA) 200-200-20 MG/5ML suspension 15-30 mL  15-30 mL Oral Q2H PRN Conrad [place], MD      . amiodarone (NEXTERONE) 1.8 mg/mL load via infusion 150 mg  150 mg Intravenous Once Lorretta Harp, MD       Followed by  . amiodarone (NEXTERONE PREMIX) 360-4.14 MG/200ML-% (1.8 mg/mL) IV infusion  30 mg/hr Intravenous Continuous Lorretta Harp, MD 16.7 mL/hr at 04/30/17 0018 30 mg/hr at 04/30/17 0018  . amiodarone (PACERONE) tablet 200 mg  200 mg Oral Daily Conrad [place], MD      . atorvastatin (LIPITOR) tablet 40 mg  40 mg Oral q1800 Conrad [place], MD      . bisacodyl (DULCOLAX) suppository 10 mg  10 mg Rectal Daily PRN Conrad [place], MD      . calcium acetate (PHOSLO) capsule 1,334 mg  1,334 mg Oral TID WC Conrad [place], MD      . cefUROXime (ZINACEF) 1.5 g in dextrose 5 % 50 mL IVPB  1.5 g Intravenous Q12H Conrad [place], MD   Stopped at 04/29/17 2300  . chlorhexidine gluconate (MEDLINE KIT) (PERIDEX) 0.12 % solution 15 mL  15 mL Mouth Rinse BID Conrad [place], MD   15 mL at 04/29/17 2000  . cinacalcet (SENSIPAR) tablet 30 mg  30 mg Oral QPM Conrad [place], MD      . famotidine (PEPCID) IVPB 20 mg premix  20 mg Intravenous Q24H Sampson Goon, MD      . fentaNYL (SUBLIMAZE) injection 12.5-25 mcg  12.5-25 mcg Intravenous Q2H PRN Juanito Doom, MD      . guaiFENesin-dextromethorphan (ROBITUSSIN DM) 100-10 MG/5ML syrup 15 mL  15 mL Oral Q4H PRN Conrad [place], MD      . heparin 1,000 Units in sodium chloride 0.9 % 500 mL irrigation   Irrigation Once Croitoru, Mihai,  MD      . hydrALAZINE (APRESOLINE) injection 5 mg  5 mg Intravenous Q20 Min PRN Conrad [place], MD      . insulin aspart (novoLOG) injection 0-9 Units  0-9 Units Subcutaneous TID WC Sampson Goon, MD      . labetalol (NORMODYNE,TRANDATE) injection 10 mg  10 mg Intravenous Q10 min PRN Conrad [place], MD      . latanoprost (XALATAN) 0.005 % ophthalmic solution 1 drop  1 drop Both Eyes QHS Conrad [place], MD      . MEDLINE mouth rinse  15 mL Mouth Rinse 10 times per day Conrad [place], MD   15 mL at 04/30/17 0600  . metoprolol tartrate (LOPRESSOR) injection 2-5 mg  2-5 mg Intravenous Q2H PRN Conrad [place], MD      . morphine 4 MG/ML injection 2 mg  2 mg Intravenous Q3H PRN Conrad [place], MD      . ondansetron [facility]) injection 4 mg  4 mg Intravenous Q6H PRN Conrad [place], MD      . oxyCODONE (Oxy IR/ROXICODONE) immediate release tablet 5-10 mg  5-10 mg Oral Q6H PRN Conrad , MD      . pantoprazole (PROTONIX) EC tablet 40 mg  40 mg Oral Daily Conrad , MD      . phenol (CHLORASEPTIC) mouth spray 1 spray  1 spray Mouth/Throat PRN Conrad , MD      . polyethylene glycol (MIRALAX / GLYCOLAX) packet 17  g  17 g Oral Daily PRN Conrad [place], MD      . QUEtiapine (SEROQUEL) tablet 25 mg  25 mg Oral BID Conrad [place], MD      . senna [facility]) tablet 8.6 mg  1 tablet Oral BID Conrad [place], MD      . sodium chloride flush (NS) 0.9 % injection 3 mL  3 mL Intravenous Q12H Lorretta Harp, MD      . sodium chloride flush (NS) 0.9 % injection 3 mL  3 mL Intravenous PRN Lorretta Harp, MD      . sodium phosphate (FLEET) 7-19 GM/118ML enema 1 enema  1 enema Rectal Once PRN Conrad , MD       Labs: Basic Metabolic Panel:  Recent Labs Lab 04/29/17 2009 04/30/17 0006 04/30/17 0550  NA 135 134* 133*  K 5.2* 6.0* 5.7*  CL 100* 99* 101  CO2 20* 18* 17*  GLUCOSE 172* 168* 115*  BUN 74* 76* 75*  CREATININE 10.53* 10.71* 10.85*  CALCIUM 9.1 9.0 9.0   Liver Function Tests:  Recent  Labs Lab 04/29/17 0308 04/30/17 0006  AST 17 25  ALT 12* 15*  ALKPHOS 62 53  BILITOT 0.5 0.6  PROT 7.8 6.9  ALBUMIN 3.5 3.0*   No results for input(s): LIPASE, AMYLASE in the last 168 hours. No results for input(s): AMMONIA in the last 168 hours. CBC:  Recent Labs Lab 04/29/17 0308 04/30/17 0006 04/30/17 0550  WBC 7.1 5.4 7.6  NEUTROABS 4.6  --   --   HGB 10.6* 8.5* 8.5*  HCT 32.8* 26.4* 26.3*  MCV 87.2 85.7 84.8  PLT 249 242 231   Cardiac Enzymes:  Recent Labs Lab 04/29/17 2009 04/30/17 0006 04/30/17 0550  TROPONINI 0.12* 0.21* 0.29*   CBG:  Recent Labs Lab 04/29/17 2148 04/30/17 0002 04/30/17 0401 04/30/17 0805  GLUCAP 185* 170* 148* 108*   Iron Studies: No results for input(s): IRON, TIBC, TRANSFERRIN, FERRITIN in the last 72 hours. Studies/Results: Dg Chest Port 1 View  Result Date: 04/29/2017 CLINICAL DATA:  Status post intubation. EXAM: PORTABLE CHEST 1 VIEW COMPARISON:  11/13/2016 FINDINGS: Left subclavian dialysis catheter is noted with tip in the low right atrium. Status post ETT tip. The tip of the ET tube appears to be 2.4 cm above the carina. Normal heart size. There is no pleural effusion or edema. No airspace opacities. IMPRESSION: Tip of the ET tube is 2.4 cm above the carina. Electronically Signed   By: Kerby Moors M.D.   On: 04/29/2017 20:23    ROS: As per HPI otherwise negative.  Physical Exam: Vitals:   04/30/17 0808 04/30/17 0830 04/30/17 0832 04/30/17 0833  BP:   (!) 124/105 (!) 141/81  Pulse:   99 80  Resp:   18 17  Temp: 98 F (36.7 C)     TempSrc: Axillary     SpO2:  100% 100% 100%  Weight:         General: Well developed, well nourished, in no acute distress. Head: Normocephalic, atraumatic, sclera non-icteric, mucus membranes are moist Neck: Supple. JVD not elevated. Lungs: Clear bilaterally to auscultation without wheezes, rales, or rhonchi. Breathing is unlabored. Heart: RRR with S1 S2. 2/6 systolic M.  V pacing on  monitor.  Abdomen: Soft, non-tender, non-distended with normoactive bowel sounds. No rebound/guarding. No obvious abdominal masses. M-S:  Strength and tone appear normal for age. Lower extremities:Bilateral BKA no stump edema. Trace edema upper thighs.  Neuro: Alert and oriented X 3. Moves all extremities spontaneously. Psych:  Responds to questions appropriately with a normal affect. Dialysis Access: LUA AVF with guaze [id number] Drain activated with serous draining LIJ TDC capped, Drsg CDI.   HD orders: T,Th,S [place] 3 hr 45 min 180 NRe  450/800 84 kg 2.0 K/2.5 Ca UF profile 4 -Heparin 2800 units IV initial bolus Heparin 2000 units IV run -Hectoral 2 mcg IV TIW (last PTH 368 04/22/17) -Venofer 50 mg IV weekly (last dose 04/22/17 Last Fe 51 Tsat 30% 04/22/17)  BMD meds:  -Calcium Acetate 667 mg 3 caps PO TID AC -Velphoro 500 mg PO TID AC (Last Phos 6.3 Ca 8.7 C Ca 8.9 04/22/17)   Assessment/Plan: 1.  Acute Respiratory Failure: Extubated to nasal cannula today. PCCM following.  2.  CHB: EP following. H/O LBBB, plan for permanent pacemaker today per Dr. Lovena Le. on IV amiodarone drip at 30 mg/hr 3.  ESRD -  T,Th,S HD off schedule today prior to [facility]. K+ 5.7 use 2.0 K bath.  4.  Hypertension/volume  - BP controlled. No oral antihypertensive meds ordered. Run even on HD today  5.  Anemia  - HGB 8.5 Has not been on ESA at OP center. Give Aranesp 60 mg IV today. Follow HGB 6.  Metabolic bone disease - Cont VDRA, Binders when able to eat.  7.  Nutrition -Albumin 3.0 NPO at present. Renal/Carb mod diet, prostat, renal vit when able to eat.  8. DM-per primary.  9. NICM: monitor volume closely.  10. H/O PAF-continue coumadin per pharmacy  Jimmye Norman. Owens Shark, NP-C 04/30/2017, 9:22 AM  [employer] [phone]  Pt seen, examined and agree w A/P as above. ESRD pt with new onset complete HB.  Had brief arrest in OR yest.  Now is stable, extubated and on HD this am.  Low K bath.  For  PPM later today per cards. Will follow.  Kelly Splinter MD [employer] pager [phone]   04/30/2017, 12:14 PM

## 2017-04-30 NOTE — Consult Note (Signed)
ELECTROPHYSIOLOGY CONSULT NOTE    Patient ID: [name] MRN: [id number], DOB/AGE: 1951/01/10 66 y.o.  Admit date: 04/29/2017 Date of Consult: 04/30/2017  Primary Physician: [name], MD Primary Cardiologist: Harrington Challenger Electrophysiologist: Lovena Le (new this admission)  Patient Profile: Oscar Castillo is a 66 y.o. male with a history of NICM, paroxysmal atrial fibrillation, ESRD on HD who is being seen today for the evaluation of complete heart block at the request of [place].  HPI:  Oscar Castillo is a 66 y.o. male who was admitted for hematoma evacuation by Dr Bridgett Larsson.  During procedure, he developed complete heart block with subsequent VT requiring cardioversion. He underwent emergent temp pacemaker placement yesterday afternoon by Dr Gwenlyn Found. EP has been asked to evaluate for treatment options.   Echo 12/2015 demonstrated EF 50-55%, no RWMA.   He denies chest pain, palpitations, dyspnea, PND, orthopnea, nausea, vomiting, dizziness, syncope, edema, weight gain, or early satiety.  Past Medical History:  Diagnosis Date  . Anemia   . Antral ulcer 2014   small  . BENIGN PROSTATIC HYPERTROPHY   . Bipolar disorder ([place])    "sometimes" (10/07/2016)  . CHOLELITHIASIS   . Chronic combined systolic and diastolic CHF (congestive heart failure) ([place])   . Complication of anesthesia    wife states pt had trouble waking up with in Nov., 2014  . CVA (cerebral vascular accident) ([place]) 07/2007  . DEPRESSION   . DIABETES MELLITUS, TYPE II    diet control  . ERECTILE DYSFUNCTION   . ESRD on hemodialysis ([place])    ESRD due to DM/HTN. Started dialysis in November 2013.  HD TTS at [facility] on [place].  [name] GERD   . GI bleed    due to gastritis, discharged 10/02/16/notes 10/07/2016  . Hepatitis C    C - has been treated  . History of Clostridium difficile   . History of kidney stones   . HYPERTENSION   . LBBB (left bundle branch block)   . Morbid obesity ([place])   . PAF (paroxysmal atrial  fibrillation) ([place])    a. Dx 12/2015.     Surgical History:  Past Surgical History:  Procedure Laterality Date  . AMPUTATION Left 05/12/2013   Procedure: AMPUTATION RAY;  Surgeon: Newt Minion, MD;  Location: [place];  Service: Orthopedics;  Laterality: Left;  Left Foot 1st Ray Amputation  . AMPUTATION Left 06/09/2013   Procedure: AMPUTATION BELOW KNEE;  Surgeon: Newt Minion, MD;  Location: [place];  Service: Orthopedics;  Laterality: Left;  Left Below Knee Amputation and removal proximal screws IM tibial nail  . AMPUTATION Right 09/08/2013   Procedure: AMPUTATION BELOW KNEE;  Surgeon: Newt Minion, MD;  Location: [place];  Service: Orthopedics;  Laterality: Right;  Right Below Knee Amputation  . AMPUTATION Right 10/11/2013   Procedure: AMPUTATION BELOW KNEE;  Surgeon: Newt Minion, MD;  Location: [place];  Service: Orthopedics;  Laterality: Right;  Right Below Knee Amputation Revision  . AV FISTULA PLACEMENT  06/14/2012   Procedure: ARTERIOVENOUS (AV) FISTULA CREATION;  Surgeon: Angelia Mould, MD;  [facility] OR;  Service: Vascular;  Laterality: Left;  Left basilic vein transposition with fistula.  [name] BASCILIC VEIN TRANSPOSITION Left 02/17/2017   Procedure: LEFT 1ST STAGE BRACHIAL VEIN TRANSPOSITION;  Surgeon: Conrad [place], MD;  Location: [place];  Service: Vascular;  Laterality: Left;  . BASCILIC VEIN TRANSPOSITION Left 04/21/2017   Procedure: LEFT 2ND STAGE BRACHIAL VEIN TRANSPOSITION;  Surgeon: Conrad [place], MD;  Location: [place];  Service: Vascular;  Laterality: Left;  . COLONOSCOPY N/A 10/28/2012   Procedure: COLONOSCOPY;  Surgeon: Jeryl Columbia, MD;  Location: [facility] ENDOSCOPY;  Service: Endoscopy;  Laterality: N/A;  . COLONOSCOPY N/A 11/02/2012   Procedure: COLONOSCOPY;  Surgeon: Cleotis Nipper, MD;  Location: [facility] ENDOSCOPY;  Service: Endoscopy;  Laterality: N/A;  . COLONOSCOPY N/A 11/03/2012   Procedure: COLONOSCOPY;  Surgeon: Cleotis Nipper, MD;  Location: [facility] ENDOSCOPY;  Service:  Endoscopy;  Laterality: N/A;  . COLONOSCOPY N/A 09/16/2016   Procedure: COLONOSCOPY;  Surgeon: Jerene Bears, MD;  Location: [facility] ENDOSCOPY;  Service: Gastroenterology;  Laterality: N/A;  . ENTEROSCOPY N/A 11/08/2012   Procedure: ENTEROSCOPY;  Surgeon: Wonda Horner, MD;  Location: [facility] ENDOSCOPY;  Service: Endoscopy;  Laterality: N/A;  . ESOPHAGOGASTRODUODENOSCOPY N/A 11/02/2012   Procedure: ESOPHAGOGASTRODUODENOSCOPY (EGD);  Surgeon: Cleotis Nipper, MD;  Location: [facility] ENDOSCOPY;  Service: Endoscopy;  Laterality: N/A;  . ESOPHAGOGASTRODUODENOSCOPY (EGD) WITH PROPOFOL N/A 09/16/2016   Procedure: ESOPHAGOGASTRODUODENOSCOPY (EGD) WITH PROPOFOL;  Surgeon: Jerene Bears, MD;  Location: [facility] ENDOSCOPY;  Service: Gastroenterology;  Laterality: N/A;  . EXCHANGE OF A DIALYSIS CATHETER Left 11/13/2016   Procedure: EXCHANGE OF A DIALYSIS CATHETER;  Surgeon: Elam Dutch, MD;  Location: [place];  Service: Vascular;  Laterality: Left;  . EYE SURGERY Left    to remove scar tissue  . GIVENS CAPSULE STUDY N/A 11/04/2012   Procedure: GIVENS CAPSULE STUDY;  Surgeon: Cleotis Nipper, MD;  Location: [facility] ENDOSCOPY;  Service: Endoscopy;  Laterality: N/A;  . GIVENS CAPSULE STUDY N/A 09/29/2016   Procedure: GIVENS CAPSULE STUDY;  Surgeon: Manus Gunning, MD;  Location: [place];  Service: Gastroenterology;  Laterality: N/A;  try to keep pt up in bedside chair as much as possible during the study.    [name] HARDWARE REMOVAL Left 06/09/2013   Procedure: HARDWARE REMOVAL;  Surgeon: Newt Minion, MD;  Location: [place];  Service: Orthopedics;  Laterality: Left;  Left Below Knee Amputation  and Removal proximal screws IM tibial nail  . HEMATOMA EVACUATION Left 04/29/2017   Procedure: EVACUATION HEMATOMA LEFT ARM;  Surgeon: Conrad Foss, MD;  Location: [place];  Service: Vascular;  Laterality: Left;  . INSERTION OF DIALYSIS CATHETER N/A 11/09/2016   Procedure: INSERTION OF TEMPORARY DIALYSIS CATHETER;  Surgeon: Elam Dutch, MD;   Location: [place];  Service: Vascular;  Laterality: N/A;  . IR GENERIC HISTORICAL  09/27/2016   IR US GUIDE VASC ACCESS RIGHT 09/27/2016 Aletta Edouard, MD [facility]-INTER[facility] RAD  . IR GENERIC HISTORICAL  09/27/2016   IR FLUORO GUIDE CV LINE RIGHT 09/27/2016 Aletta Edouard, MD [facility]  . LIGATION OF ARTERIOVENOUS  FISTULA Left 11/09/2016   Procedure: LIGATION OF ARTERIOVENOUS  FISTULA;  Surgeon: Elam Dutch, MD;  Location: [place];  Service: Vascular;  Laterality: Left;  . NEPHRECTOMY     partial RR  . ORIF FIBULA FRACTURE Left 09/09/2012   Procedure: OPEN REDUCTION INTERNAL FIXATION (ORIF) FIBULA FRACTURE;  Surgeon: Johnny Bridge, MD;  Location: [place];  Service: Orthopedics;  Laterality: Left;  . TIBIA IM NAIL INSERTION Left 09/09/2012   Procedure: INTRAMEDULLARY (IM) NAIL TIBIAL;  Surgeon: Johnny Bridge, MD;  Location: [place];  Service: Orthopedics;  Laterality: Left;  left tibial nail and open reduction internal fixation left fibula fracture  . UPPER EXTREMITY VENOGRAPHY N/A 12/14/2016   Procedure: Bilateral Upper Extremity Venography and Central Venography;  Surgeon:  Conrad Plainview, MD;  Location: [place] CV LAB;  Service: Cardiovascular;  Laterality: N/A;     Prescriptions Prior to Admission  Medication Sig Dispense Refill Last Dose  . acetaminophen (TYLENOL) 500 MG tablet Take 500 mg by mouth every 6 (six) hours as needed for mild pain.   Past Week at Unknown time  . amiodarone (PACERONE) 200 MG tablet TAKE 1 TABLET BY MOUTH DAILY, START THIS ON 11/06/16 90 tablet 3 04/28/2017 at Unknown time  . atorvastatin (LIPITOR) 40 MG tablet TAKE 1 TABLET(40 MG) BY MOUTH DAILY AT 6 PM 30 tablet 4 04/28/2017 at Unknown time  . calcium acetate (PHOSLO) 667 MG capsule Take 1,334 mg by mouth 3 (three) times daily with meals.    04/28/2017 at Unknown time  . latanoprost (XALATAN) 0.005 % ophthalmic solution Place 1 drop into both eyes at bedtime.    04/28/2017 at Unknown time  . oxyCODONE-acetaminophen (ROXICET)  5-325 MG tablet Take 1 tablet by mouth every 4 (four) hours as needed for moderate pain. 20 tablet 0   . pantoprazole (PROTONIX) 40 MG tablet Take 1 tablet (40 mg total) by mouth 2 (two) times daily before a meal. 60 tablet 11 04/21/2017 at 0500  . QUEtiapine (SEROQUEL) 25 MG tablet TAKE 1 TABLET(25 MG) BY MOUTH TWICE DAILY 60 tablet 8 04/28/2017 at Unknown time  . SENSIPAR 30 MG tablet Take 30 mg by mouth every evening.    04/28/2017 at Unknown time  . silver sulfADIAZINE (SILVADENE) 1 % cream Apply 1 application topically daily as needed (irratation). 50 g  Past Week at Unknown time  . SSD 1 % cream APPLY EXTERNALLY TO THE AFFECTED AREA DAILY 50 g 0 04/28/2017 at Unknown time  . traMADol (ULTRAM) 50 MG tablet Take 50 mg by mouth every 6 (six) hours as needed for moderate pain.   04/28/2017 at Unknown time  . warfarin (COUMADIN) 2.5 MG tablet Take 1 tablet daily except 1.5 tablets on Wed and Fri or as directed by Anticoagulation Clinic (Patient taking differently: Take 2.5-3.75 mg by mouth See admin instructions. Take 1 tablet daily except 1.5 tablets on Friday) 40 tablet 3 04/28/2017 at 1800    Inpatient Medications:  . amiodarone  150 mg Intravenous Once  . amiodarone  200 mg Oral Daily  . atorvastatin  40 mg Oral q1800  . calcium acetate  1,334 mg Oral TID WC  . chlorhexidine gluconate (MEDLINE KIT)  15 mL Mouth Rinse BID  . cinacalcet  30 mg Oral QPM  . heparin 1000 unit irrigation   Irrigation Once  . insulin aspart  0-9 Units Subcutaneous TID WC  . latanoprost  1 drop Both Eyes QHS  . mouth rinse  15 mL Mouth Rinse 10 times per day  . pantoprazole  40 mg Oral Daily  . QUEtiapine  25 mg Oral BID  . senna  1 tablet Oral BID  . sodium chloride flush  3 mL Intravenous Q12H    Allergies:  Allergies  Allergen Reactions  . No Known Allergies     Social History   Social History  . Marital status: Married    Spouse name: N/A  . Number of children: 2  . Years of education: 16    Occupational History  . disabled due to stroke Retired   Social History Main Topics  . Smoking status: Never Smoker  . Smokeless tobacco: Never Used  . Alcohol use No  . Drug use: No  . Sexual activity: Yes  Birth control/ protection: None   Other Topics Concern  . 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 Diabetes Mother   . Hypertension Mother   . Heart attack Father   . Hypertension Father   . Coronary artery disease Other      Review of Systems: All other systems reviewed and are otherwise negative except as noted above.  Physical Exam: Vitals:   04/30/17 0830 04/30/17 0832 04/30/17 0833 04/30/17 0930  BP:  (!) 124/105 (!) 141/81 109/66  Pulse:  99 80 81  Resp:  _0 Temp:      TempSrc:      SpO2: 100% 100% 100% 100%  Weight:        GEN- The patient is chronically ill appearing, alert and oriented x 3 today.   HEENT: normocephalic, atraumatic; sclera clear, conjunctiva pink; hearing intact; oropharynx clear; neck supple Lungs- Clear to ausculation bilaterally, normal work of breathing.  No wheezes, rales, rhonchi Heart- Regular rate and rhythm  GI- soft, non-tender, non-distended, bowel sounds present Extremities- s/p bilateral BKA, left arm with dressing MS- no significant deformity or atrophy Skin- warm and dry, no rash or lesion Psych- euthymic mood, full affect Neuro- strength and sensation are intact  Labs:   Lab Results  Component Value Date   WBC 7.6 04/30/2017   HGB 8.5 (L) 04/30/2017   HCT 26.3 (L) 04/30/2017   MCV 84.8 04/30/2017   PLT 231 04/30/2017     Recent Labs Lab 04/30/17 0006 04/30/17 0550  NA 134* 133*  K 6.0* 5.7*  CL 99* 101  CO2 18* 17*  BUN 76* 75*  CREATININE 10.71* 10.85*  CALCIUM 9.0 9.0  PROT 6.9  --   BILITOT 0.6  --   ALKPHOS 53  --   ALT 15*  --   AST 25  --    GLUCOSE 168* 115*      Radiology/Studies: Dg Chest Port 1 View Result Date: 04/29/2017 CLINICAL DATA:  Status post intubation. EXAM: PORTABLE CHEST 1 VIEW COMPARISON:  11/13/2016 FINDINGS: Left subclavian dialysis catheter is noted with tip in the low right atrium. Status post ETT tip. The tip of the ET tube appears to be 2.4 cm above the carina. Normal heart size. There is no pleural effusion or edema. No airspace opacities. IMPRESSION: Tip of the ET tube is 2.4 cm above the carina. Electronically Signed   By: Kerby Moors M.D.   On: 04/29/2017 20:23    QHU:[id number] heart block, RBBB ventricular escape, rate 39 (personally reviewed)  TELEMETRY: V pacing at 80 (personally reviewed)  Assessment/Plan: 1.  Complete heart block The patient has complete heart block with subsequent VT requiring cardioversion. He has been stable overnight post temp pacemaker implantation. He has had prior LBBB with RBBB ventricular escape during CHB.  Plan for pacemaker implant today.  His infection risks are increased with HD, discussed with patient.  INR is 2.59 today He is going to have HD this morning, will plan for pacemaker implant later today with Dr Lovena Le  2.  VT In the setting of new onset complete heart block Continue amio for now  3.  ESRD on HD Per nephrology  Plan HD this morning  4.  Paroxysmal atrial fibrillation Currently in SR He has history of embolic stroke, ideally  will let INR drift down just a little with pacemaker implant.   5.  Elevated troponin Likely 2/2 PMVT/VF and cardioversion yesterday  Dr Lovena Le to see later today.   EP Attending  Patient seen and examined. Agree with above. The patient has developed intermittent CHB in the setting of chronic LBBB and this was complicated by VF requiring defibrillation. He has an indwelling HD catheter and a new graft in the left arm which developed a hematoma requiring evacuation. He has a very bad situation. His infection risk is  very high with chronic HD, with an indwelling catheter but we really have no other options at this point except to place a DDD PM on the contralateral side. Will plan to remove his temporary PM at the time of his [facility] PPM insertion. I have reviewed the indications/risks/benefits/goals/expectations of PPM insertion and he is willing to proceed.   Gregg Taylor,M.D.  Signed, Chanetta Marshall 04/30/2017 9:44 AM

## 2017-04-30 NOTE — Procedures (Signed)
   I was present at this dialysis session, have reviewed the session itself and made  appropriate changes Kelly Splinter MD [place] pager [phone]   04/30/2017, 12:16 PM

## 2017-04-30 NOTE — Progress Notes (Signed)
Arrived to patient room 2H-12 at 1003.  Reviewed treatment plan and this RN agrees with plan.  Report received from bedside RN, Melissa.  Consent obtained.  Patient responds to voice, O X 4.   Lung sounds clear and diminished to ausculation in all fields. No edema. Cardiac:  V-paced.  Removed caps and cleansed LIJ catheter with chlorhedxidine.  Aspirated ports of heparin and flushed them with saline per protocol.  Connected and secured lines, initiated treatment at 1030.  UF Goal of 500 mL and net fluid removal 0 L.  Will continue to monitor.

## 2017-04-30 NOTE — Telephone Encounter (Signed)
-----   Message from Mena Goes, RN sent at 04/30/2017  9:21 AM EDT ----- Regarding: drain removal in 1 week   ----- Message ----- From: Conrad [place]ana, MD Sent: 04/29/2017   5:56 PM To: Vvs Charge Pool  Can't remember if I sent in this charge  Oscar Castillo [id number] Aug 24, 1950  Procedure: Evacuation of left arm hematoma  Asst: Linus Orn, CSA  Follow-up: 1 week for drain removal

## 2017-04-30 NOTE — Telephone Encounter (Signed)
Sched appt 05/07/17 at 12:30. Lm on hm#.

## 2017-04-30 NOTE — Procedures (Signed)
Extubation Procedure Note  Patient Details:   Name: Oscar Castillo DOB: [birth year] MRN: [id number]   Airway Documentation:  Airway 8 mm (Active)  Secured at (cm) 26 cm 04/30/2017  4:00 AM  Measured From Lips 04/30/2017  4:00 AM  Secured Location Right 04/30/2017  4:00 AM  Secured By [employer] 04/30/2017  4:00 AM  Tube Holder Repositioned Yes 04/30/2017  4:00 AM  Cuff Pressure (cm H2O) 28 cm H2O 04/29/2017  8:03 PM  Site Condition Dry 04/30/2017  4:00 AM    Evaluation  O2 sats: stable throughout Complications: No apparent complications Patient did tolerate procedure well. Bilateral Breath Sounds: Clear, Diminished   Yes   Patient extubated per order to 4L [place] with no complications. Cuff leak was noted prior to extubation. Vitals are stable and sats are 100%. Patient is able to speak and is oriented to time and place. RT will continue to monitor.   Kresha Abelson Clyda Greener 04/30/2017, 8:33 AM

## 2017-05-01 ENCOUNTER — Inpatient Hospital Stay (HOSPITAL_COMMUNITY): Payer: Medicare Other

## 2017-05-01 DIAGNOSIS — R748 Abnormal levels of other serum enzymes: Secondary | ICD-10-CM

## 2017-05-01 LAB — CBC
HCT: 24.7 % — ABNORMAL LOW (ref 39.0–52.0)
Hemoglobin: 7.6 g/dL — ABNORMAL LOW (ref 13.0–17.0)
MCH: 26.6 pg (ref 26.0–34.0)
MCHC: 30.8 g/dL (ref 30.0–36.0)
MCV: 86.4 fL (ref 78.0–100.0)
PLATELETS: 208 10*3/uL (ref 150–400)
RBC: 2.86 MIL/uL — AB (ref 4.22–5.81)
RDW: 17.3 % — AB (ref 11.5–15.5)
WBC: 6.5 10*3/uL (ref 4.0–10.5)

## 2017-05-01 LAB — PROTIME-INR
INR: 2.25
Prothrombin Time: 24.7 seconds — ABNORMAL HIGH (ref 11.4–15.2)

## 2017-05-01 LAB — BASIC METABOLIC PANEL
Anion gap: 14 (ref 5–15)
BUN: 35 mg/dL — AB (ref 6–20)
CALCIUM: 8 mg/dL — AB (ref 8.9–10.3)
CHLORIDE: 94 mmol/L — AB (ref 101–111)
CO2: 24 mmol/L (ref 22–32)
CREATININE: 6.92 mg/dL — AB (ref 0.61–1.24)
GFR calc non Af Amer: 7 mL/min — ABNORMAL LOW (ref >60)
GFR, EST AFRICAN AMERICAN: 9 mL/min — AB (ref >60)
Glucose, Bld: 133 mg/dL — ABNORMAL HIGH (ref 65–99)
POTASSIUM: 4.1 mmol/L (ref 3.5–5.1)
SODIUM: 132 mmol/L — AB (ref 135–145)

## 2017-05-01 LAB — GLUCOSE, CAPILLARY
GLUCOSE-CAPILLARY: 138 mg/dL — AB (ref 65–99)
Glucose-Capillary: 167 mg/dL — ABNORMAL HIGH (ref 65–99)
Glucose-Capillary: 98 mg/dL (ref 65–99)
Glucose-Capillary: 159 mg/dL — ABNORMAL HIGH (ref 65–99)

## 2017-05-01 LAB — CALCIUM, IONIZED: CALCIUM, IONIZED, SERUM: 4.5 mg/dL (ref 4.5–5.6)

## 2017-05-01 MED ORDER — PENTAFLUOROPROP-TETRAFLUOROETH EX AERO: 1.0000 "application " | INHALATION_SPRAY | CUTANEOUS | Status: DC | PRN

## 2017-05-01 MED ORDER — OXYCODONE HCL 5 MG PO TABS
ORAL_TABLET | ORAL | Status: AC
  Administered 2017-05-01: 10 mg via ORAL
  Filled 2017-05-01: qty 2

## 2017-05-01 MED ORDER — LIDOCAINE-PRILOCAINE 2.5-2.5 % EX CREA: 1.0000 "application " | TOPICAL_CREAM | CUTANEOUS | Status: DC | PRN

## 2017-05-01 MED ORDER — SODIUM CHLORIDE 0.9 % IV SOLN: 100.0000 mL | INTRAVENOUS | Status: DC | PRN

## 2017-05-01 MED ORDER — HEPARIN SODIUM (PORCINE) 1000 UNIT/ML DIALYSIS: 1000.0000 [IU] | INTRAMUSCULAR | Status: DC | PRN

## 2017-05-01 MED ORDER — HEPARIN SODIUM (PORCINE) 1000 UNIT/ML DIALYSIS: 2000.0000 [IU] | INTRAMUSCULAR | Status: DC | PRN

## 2017-05-01 MED ORDER — ALTEPLASE 2 MG IJ SOLR: 2.0000 mg | Freq: Once | INTRAMUSCULAR | Status: DC | PRN

## 2017-05-01 MED ORDER — LIDOCAINE HCL (PF) 1 % IJ SOLN: 5.0000 mL | INTRAMUSCULAR | Status: DC | PRN

## 2017-05-01 NOTE — Progress Notes (Signed)
Progress Note  Patient Name: Oscar Castillo Date of Encounter: 05/01/2017  Primary Cardiologist: Dr. Harrington Challenger  Subjective   Minimal right sided chest discomfort after pacemaker insertion, which is incisional and not pleuritic.  Inpatient Medications    Scheduled Meds: . atorvastatin  40 mg Oral q1800  . calcium acetate  1,334 mg Oral TID WC  . cinacalcet  30 mg Oral QPM  . heparin 1000 unit irrigation   Irrigation Once  . insulin aspart  0-9 Units Subcutaneous TID WC  . latanoprost  1 drop Both Eyes QHS  . pantoprazole  40 mg Oral Daily  . senna  1 tablet Oral BID  . sodium chloride flush  3 mL Intravenous Q12H   Continuous Infusions: . sodium chloride    .  ceFAZolin (ANCEF) IV Stopped (05/01/17 0508)  . cefUROXime (ZINACEF)  IV    . famotidine (PEPCID) IV     PRN Meds: sodium chloride, acetaminophen, alteplase, alum & mag hydroxide-simeth, bisacodyl, fentaNYL (SUBLIMAZE) injection, guaiFENesin-dextromethorphan, heparin, hydrALAZINE, labetalol, lidocaine (PF), lidocaine-prilocaine, metoprolol tartrate, morphine injection, ondansetron (ZOFRAN) IV, oxyCODONE, pentafluoroprop-tetrafluoroeth, phenol, polyethylene glycol, sodium chloride flush, sodium phosphate   Vital Signs    Vitals:   05/01/17 0347 05/01/17 0400 05/01/17 0500 05/01/17 0600  BP:  (!) 89/58 95/61 98/62   Pulse:  72 72 73  Resp:  18 19 16   Temp: 98.5 F (36.9 C)     TempSrc: Oral     SpO2:  100% 100% 100%  Weight:        Intake/Output Summary (Last 24 hours) at 05/01/17 0858 Last data filed at 05/01/17 0508  Gross per 24 hour  Intake           1313.6 ml  Output               35 ml  Net           1278.6 ml   Filed Weights   04/29/17 2000 04/30/17 1003 04/30/17 1345  Weight: 197 lb 12 oz (89.7 kg) 200 lb 13.4 oz (91.1 kg) 200 lb 13.4 oz (91.1 kg)    Telemetry    Normal sinus rhythm - Personally Reviewed  ECG    Normal sinus rhythm with left bundle branch block - Personally Reviewed  Physical  Exam   GEN: No acute distress.   Neck:  unable to assess JVD Cardiac: RRR, split S2, no murmurs, rubs, or gallops.  Respiratory: Clear to auscultation bilaterally. GI: Soft, nontender, non-distended  MS: No edema; No deformity. Status post amputations Neuro:  Nonfocal  Psych: Normal affect   Labs    Chemistry Recent Labs Lab 04/29/17 0308  04/30/17 0006 04/30/17 0550 05/01/17 0339  NA 134*  < > 134* 133* 132*  K 4.8  < > 6.0* 5.7* 4.1  CL 98*  < > 99* 101 94*  CO2 21*  < > 18* 17* 24  GLUCOSE 158*  < > 168* 115* 133*  BUN 65*  < > 76* 75* 35*  CREATININE 10.01*  < > 10.71* 10.85* 6.92*  CALCIUM 8.8*  < > 9.0 9.0 8.0*  PROT 7.8  --  6.9  --   --   ALBUMIN 3.5  --  3.0*  --   --   AST 17  --  25  --   --   ALT 12*  --  15*  --   --   ALKPHOS 62  --  53  --   --  BILITOT 0.5  --  0.6  --   --   GFRNONAA 5*  < > 4* 4* 7*  GFRAA 6*  < > 5* 5* 9*  ANIONGAP 15  < > 17* 15 14  < > = values in this interval not displayed.   Hematology Recent Labs Lab 04/30/17 0006 04/30/17 0550 05/01/17 0339  WBC 5.4 7.6 6.5  RBC 3.08* 3.10* 2.86*  HGB 8.5* 8.5* 7.6*  HCT 26.4* 26.3* 24.7*  MCV 85.7 84.8 86.4  MCH 27.6 27.4 26.6  MCHC 32.2 32.3 30.8  RDW 17.1* 17.1* 17.3*  PLT 242 231 208    Cardiac Enzymes Recent Labs Lab 04/29/17 2009 04/30/17 0006 04/30/17 0550  TROPONINI 0.12* 0.21* 0.29*    Recent Labs Lab 04/29/17 0336  TROPIPOC 0.00     BNPNo results for input(s): BNP, PROBNP in the last 168 hours.   DDimer No results for input(s): DDIMER in the last 168 hours.   Radiology    Dg Chest Port 1 View  Result Date: 05/01/2017 CLINICAL DATA:  Pacemaker placement. EXAM: PORTABLE CHEST 1 VIEW COMPARISON:  Chest x-ray dated April 29, 2017. FINDINGS: Interval placement of a dual lead right chest wall AICD with leads terminating in the right atrium and right ventricle. Unchanged left internal jugular tunnel dialysis catheter with tip in the right atrium. Interval  removal of endotracheal tube. The cardiomediastinal silhouette is normal in size. Normal pulmonary vascularity. Bibasilar atelectasis. No focal consolidation, pleural effusion, or pneumothorax. No acute osseous abnormality. IMPRESSION: Right chest wall AICD placement.  No pneumothorax. Electronically Signed   By: Titus Dubin M.D.   On: 05/01/2017 08:50   Dg Chest Port 1 View  Result Date: 04/29/2017 CLINICAL DATA:  Status post intubation. EXAM: PORTABLE CHEST 1 VIEW COMPARISON:  11/13/2016 FINDINGS: Left subclavian dialysis catheter is noted with tip in the low right atrium. Status post ETT tip. The tip of the ET tube appears to be 2.4 cm above the carina. Normal heart size. There is no pleural effusion or edema. No airspace opacities. IMPRESSION: Tip of the ET tube is 2.4 cm above the carina. Electronically Signed   By: Kerby Moors M.D.   On: 04/29/2017 20:23    Cardiac Studies   None  Patient Profile     66 y.o. male admitted to the unit after left upper extremity hematoma evacuation complicated by complete heart block complicated by polymorphic ventricular tachycardia  Assessment & Plan    1. Complete heart block - he is status post right sided permanent pacemaker implantation. He is currently conducting with left bundle branch block and not pacing. 2. End-stage renal disease on hemodialysis - unfortunately he has an indwelling left subclavian dialysis catheter which will hopefully be present for a brief period while his left arm dialysis access site is healing. I would strongly recommend removal of the indwelling left subclavian dialysis catheter as soon as his left arm dialysis site has matured. The longer the indwelling dialysis catheter remains in place, the more likely it is that his pacing system will get infected. 3. Pacemaker - his dual-chamber Medtronic pacemaker has been interrogated under my direct supervision and is working normally today. Usual follow-up. 4. Disposition -  we will sign off for now. Please call for questions. For questions or updates, please contact [place] Please consult [email or web address] for contact info under Cardiology/STEMI.      Signed, Cristopher Peru, MD  05/01/2017, 8:58 AM  Patient ID: Oscar Castillo, male  DOB: 1950/12/06, 66 y.o.   MRN: [id number]

## 2017-05-01 NOTE — Progress Notes (Signed)
Pt arrived to unit from HD, transfer from [facility]. Wife at bedside, pt denies any pain.

## 2017-05-01 NOTE — Progress Notes (Signed)
Progress Note  Patient Name: Oscar Castillo Date of Encounter: 05/01/2017  Primary Cardiologist: Dorris Carnes  Subjective   Feels well. Does not have angina. Denies shortness of breath. States he is hungry and wants TEE. He's been seen by EP and is felt stable and they have signed off.  Inpatient Medications    Scheduled Meds: . atorvastatin  40 mg Oral q1800  . calcium acetate  1,334 mg Oral TID WC  . cinacalcet  30 mg Oral QPM  . heparin 1000 unit irrigation   Irrigation Once  . insulin aspart  0-9 Units Subcutaneous TID WC  . latanoprost  1 drop Both Eyes QHS  . pantoprazole  40 mg Oral Daily  . senna  1 tablet Oral BID  . sodium chloride flush  3 mL Intravenous Q12H   Continuous Infusions: . sodium chloride    . cefUROXime (ZINACEF)  IV    . famotidine (PEPCID) IV     PRN Meds: sodium chloride, acetaminophen, alteplase, alum & mag hydroxide-simeth, bisacodyl, fentaNYL (SUBLIMAZE) injection, guaiFENesin-dextromethorphan, heparin, hydrALAZINE, labetalol, lidocaine (PF), lidocaine-prilocaine, metoprolol tartrate, morphine injection, ondansetron (ZOFRAN) IV, oxyCODONE, pentafluoroprop-tetrafluoroeth, phenol, polyethylene glycol, sodium chloride flush, sodium phosphate   Vital Signs    Vitals:   05/01/17 0400 05/01/17 0500 05/01/17 0600 05/01/17 0700  BP: (!) 89/58 95/61 98/62    Pulse: 72 72 73   Resp: 18 19 16    Temp:    98.6 F (37 C)  TempSrc:    Oral  SpO2: 100% 100% 100%   Weight:        Intake/Output Summary (Last 24 hours) at 05/01/17 0915 Last data filed at 05/01/17 0508  Gross per 24 hour  Intake           1246.9 ml  Output               35 ml  Net           1211.9 ml   Filed Weights   04/29/17 2000 04/30/17 1003 04/30/17 1345  Weight: 197 lb 12 oz (89.7 kg) 200 lb 13.4 oz (91.1 kg) 200 lb 13.4 oz (91.1 kg)    Telemetry    Left bundle branch block/sinus rhythm. Rare pacing. - Personally Reviewed  ECG    Left bundle branch block with first-degree  AV block. No change from prior - Personally Reviewed  Physical Exam  Obese African-American male with bilateral lower extremity amputation. GEN: No acute distress.   Neck: No JVD Cardiac: RRR, no murmurs, rubs. An S4 gallop is heard.[name]  Respiratory: Clear to auscultation bilaterally. GI: Soft, nontender, non-distended  MS: No edema; No deformity. Neuro:  Nonfocal  Psych: Normal affect   Labs    Chemistry Recent Labs Lab 04/29/17 0308  04/30/17 0006 04/30/17 0550 05/01/17 0339  NA 134*  < > 134* 133* 132*  K 4.8  < > 6.0* 5.7* 4.1  CL 98*  < > 99* 101 94*  CO2 21*  < > 18* 17* 24  GLUCOSE 158*  < > 168* 115* 133*  BUN 65*  < > 76* 75* 35*  CREATININE 10.01*  < > 10.71* 10.85* 6.92*  CALCIUM 8.8*  < > 9.0 9.0 8.0*  PROT 7.8  --  6.9  --   --   ALBUMIN 3.5  --  3.0*  --   --   AST 17  --  25  --   --   ALT 12*  --  15*  --   --  ALKPHOS 62  --  53  --   --   BILITOT 0.5  --  0.6  --   --   GFRNONAA 5*  < > 4* 4* 7*  GFRAA 6*  < > 5* 5* 9*  ANIONGAP 15  < > 17* 15 14  < > = values in this interval not displayed.   Hematology Recent Labs Lab 04/30/17 0006 04/30/17 0550 05/01/17 0339  WBC 5.4 7.6 6.5  RBC 3.08* 3.10* 2.86*  HGB 8.5* 8.5* 7.6*  HCT 26.4* 26.3* 24.7*  MCV 85.7 84.8 86.4  MCH 27.6 27.4 26.6  MCHC 32.2 32.3 30.8  RDW 17.1* 17.1* 17.3*  PLT 242 231 208    Cardiac Enzymes Recent Labs Lab 04/29/17 2009 04/30/17 0006 04/30/17 0550  TROPONINI 0.12* 0.21* 0.29*    Recent Labs Lab 04/29/17 0336  TROPIPOC 0.00     BNPNo results for input(s): BNP, PROBNP in the last 168 hours.   DDimer No results for input(s): DDIMER in the last 168 hours.   Radiology    Dg Chest Port 1 View  Result Date: 05/01/2017 CLINICAL DATA:  Pacemaker placement. EXAM: PORTABLE CHEST 1 VIEW COMPARISON:  Chest x-ray dated April 29, 2017. FINDINGS: Interval placement of a dual lead right chest wall AICD with leads terminating in the right atrium and right ventricle.  Unchanged left internal jugular tunnel dialysis catheter with tip in the right atrium. Interval removal of endotracheal tube. The cardiomediastinal silhouette is normal in size. Normal pulmonary vascularity. Bibasilar atelectasis. No focal consolidation, pleural effusion, or pneumothorax. No acute osseous abnormality. IMPRESSION: Right chest wall AICD placement.  No pneumothorax. Electronically Signed   By: Titus Dubin M.D.   On: 05/01/2017 08:50   Dg Chest Port 1 View  Result Date: 04/29/2017 CLINICAL DATA:  Status post intubation. EXAM: PORTABLE CHEST 1 VIEW COMPARISON:  11/13/2016 FINDINGS: Left subclavian dialysis catheter is noted with tip in the low right atrium. Status post ETT tip. The tip of the ET tube appears to be 2.4 cm above the carina. Normal heart size. There is no pleural effusion or edema. No airspace opacities. IMPRESSION: Tip of the ET tube is 2.4 cm above the carina. Electronically Signed   By: Kerby Moors M.D.   On: 04/29/2017 20:23    Cardiac Studies   Nuclear Stress test 2017:  Downsloping ST segment depression ST segment depression was noted during stress in the II, V5 and V6 leads.  This is an intermediate risk study.  The left ventricular ejection fraction is moderately decreased (30-44%).   1. There was a fixed, medium-sized, mild mid to apical anteroseptal and inferoseptal perfusion defect.  2. EF 36% with septal wall motion abnormality. 3. Intermediate risk study.  Findings can all be explained by LBBB itself (perfusion defect due to artifact from LBBB), however cannot rule out prior infarction.  No significant ischemia.  Intermediate risk due to low EF.  Would get echo to confirm EF.   Echocardiogram 04/30/17: Study Conclusions  - Left ventricle: The cavity size was normal. There was mild   concentric hypertrophy. Systolic function was normal. The   estimated ejection fraction was in the range of 55% to 60%. Wall   motion was normal; there were no  regional wall motion   abnormalities. There was an increased relative contribution of   atrial contraction to ventricular filling. Doppler parameters are   consistent with abnormal left ventricular relaxation (grade 1   diastolic dysfunction). - Ventricular septum: Septal  motion showed moderate paradox. These   changes are consistent with right ventricular pacing. - Aortic valve: Trileaflet; moderately thickened, moderately   calcified leaflets. - Mitral valve: Severely calcified posterior mitral valve annulus.   The posterior MV leaflet is fixed. - Right ventricle: Poorly visualized. The cavity size was   moderately dilated. Wall thickness was normal.  Patient Profile     66 y.o. male  with a hx of Mild nonischemic cardiomyopathy, paroxysmal atrial fibrillation, incisional disease on dialysis, who is being seen today for the evaluation of complete heart block following vascular surgery at the request of Dr. Bridgett Larsson. Had permanent pacemaker placed in the right subclavian distribution on 04/30/17 and is doing well this morning without pocket hematoma. Has been seen by EP.  Assessment & Plan    1. Complete heart block felt secondary to underlying conduction system disease. Treated with permanent pacemaker and doing well this morning. 2. History of nonischemic cardiomyopathy.  3. Elevation in troponin secondary to kidney disease/demand ischemia. Ischemic evaluation is not planned during this hospital stay. This will be reconsidered when he sees his primary cardiologist to determine if coronary angiography should be performed. 4. End-stage kidney disease on chronic dialysis.  For questions or updates, please contact [place] Please consult [email or web address] for contact info under Cardiology/STEMI.      Signed, Sinclair Grooms, MD  05/01/2017, 9:15 AM

## 2017-05-01 NOTE — Progress Notes (Signed)
Pt to HD at this time.  No s/s of any acute distress.  No c/o pain.

## 2017-05-01 NOTE — Progress Notes (Signed)
Patient arrived to unit by bed.  Reviewed treatment plan and this RN agrees with plan.  Report received from bedside RN, Junie Panning.  Consent verified.  Patient A & O X 4.   Lung sounds diminished to ausculation in all fields. BLE 1+ edema. Cardiac:  A-V paced.  Removed caps and cleansed LIJ catheter with chlorhedxidine.  Aspirated ports of heparin and flushed them with saline per protocol.  Connected and secured lines, initiated treatment at 1223.  UF Goal of 4500 mL and net fluid removal 4 L.  Will continue to monitor.

## 2017-05-01 NOTE — Progress Notes (Signed)
[facility] Progress Note  Subjective: feeling better, had PPM placed yesterday. No SOB or cough, no CP.   Vitals:   05/01/17 0400 05/01/17 0500 05/01/17 0600 05/01/17 0700  BP: (!) 89/58 95/61 98/62    Pulse: 72 72 73   Resp: 18 19 16    Temp:    98.6 F (37 C)  TempSrc:    Oral  SpO2: 100% 100% 100%   Weight:        Inpatient medications: . atorvastatin  40 mg Oral q1800  . calcium acetate  1,334 mg Oral TID WC  . cinacalcet  30 mg Oral QPM  . heparin 1000 unit irrigation   Irrigation Once  . insulin aspart  0-9 Units Subcutaneous TID WC  . latanoprost  1 drop Both Eyes QHS  . pantoprazole  40 mg Oral Daily  . senna  1 tablet Oral BID  . sodium chloride flush  3 mL Intravenous Q12H   . sodium chloride    . cefUROXime (ZINACEF)  IV    . famotidine (PEPCID) IV     sodium chloride, acetaminophen, alteplase, alum & mag hydroxide-simeth, bisacodyl, fentaNYL (SUBLIMAZE) injection, guaiFENesin-dextromethorphan, heparin, hydrALAZINE, labetalol, lidocaine (PF), lidocaine-prilocaine, metoprolol tartrate, morphine injection, ondansetron (ZOFRAN) IV, oxyCODONE, pentafluoroprop-tetrafluoroeth, phenol, polyethylene glycol, sodium chloride flush, sodium phosphate  Exam: Alert, no distress No jvd Chest clear bilat RRR no mrg Abd obese soft ntnd EXt bilat BKA no edema NF, Ox 3  Dialysis: [facility] 3h 72min   84kg   2/2.5  Hep 2800 then 2000   L IJ TDC/ LUA AVF - Hectoral 2 mcg IV TIW (last PTH 368 04/22/17) - Venofer 50 mg IV weekly (last dose 04/22/17 Last Fe 51 Tsat 30% 04/22/17)  BMD meds:  -Calcium Acetate 667 mg 3 caps PO TID AC -Velphoro 500 mg PO TID AC (Last Phos 6.3 Ca 8.7 C Ca 8.9 04/22/17)     Impression: 1. Complete heart block - sp PPM yest.   2. Hyperkalemia - mild, resolved 3. ESRD cont HD TTS 4. HTN/ vol - BP's marginal, up 5 kg by wts 5. MBD cont meds 6. DM per primary 7. NICM 8. Hx PAF   Plan - HD today   Kelly Splinter MD [facility] pager [phone]   05/01/2017, 9:47 AM    Recent Labs Lab 04/30/17 0006 04/30/17 0550 05/01/17 0339  NA 134* 133* 132*  K 6.0* 5.7* 4.1  CL 99* 101 94*  CO2 18* 17* 24  GLUCOSE 168* 115* 133*  BUN 76* 75* 35*  CREATININE 10.71* 10.85* 6.92*  CALCIUM 9.0 9.0 8.0*    Recent Labs Lab 04/29/17 0308 04/30/17 0006  AST 17 25  ALT 12* 15*  ALKPHOS 62 53  BILITOT 0.5 0.6  PROT 7.8 6.9  ALBUMIN 3.5 3.0*    Recent Labs Lab 04/29/17 0308 04/30/17 0006 04/30/17 0550 05/01/17 0339  WBC 7.1 5.4 7.6 6.5  NEUTROABS 4.6  --   --   --   HGB 10.6* 8.5* 8.5* 7.6*  HCT 32.8* 26.4* 26.3* 24.7*  MCV 87.2 85.7 84.8 86.4  PLT 249 242 231 208   Iron/TIBC/Ferritin/ %Sat    Component Value Date/Time   IRON 50 10/01/2016 0419   TIBC 167 (L) 10/01/2016 0419   FERRITIN 351 (H) 10/01/2016 0419   IRONPCTSAT 30 10/01/2016 0419

## 2017-05-01 NOTE — Progress Notes (Signed)
Dialysis treatment completed.  2700 mL ultrafiltrated.  2000 mL net fluid removal.  Patient status unchanged. Lung sounds diminished to ausculation in all fields. Generalized edema. Cardiac: NSR.  Cleansed LIJ catheter with chlorhexidine.  Disconnected lines and flushed ports with saline per protocol.  Ports locked with heparin and capped per protocol.    Report given to bedside, RN Rodena Piety.

## 2017-05-01 NOTE — Progress Notes (Signed)
[facility] PCCM  Doing well post extubation PCCM will sign off  Roselie Awkward, MD [facility] Pager: [phone] Cell: [phone] After 3pm or if no response, call [phone]

## 2017-05-01 NOTE — Progress Notes (Signed)
Subjective: Interval History: none.. Comfortable this morning. No difficulty with extubation yesterday. Had pacemaker placed without difficulty.  Objective: Vital signs in last 24 hours: Temp:  [97.8 F (36.6 C)-98.9 F (37.2 C)] 98.6 F (37 C) (10/06 0700) Pulse Rate:  [70-83] 73 (10/06 0600) Resp:  [11-19] 16 (10/06 0600) BP: (79-124)/(54-89) 98/62 (10/06 0600) SpO2:  [98 %-100 %] 100 % (10/06 0600) Weight:  [200 lb 13.4 oz (91.1 kg)] 200 lb 13.4 oz (91.1 kg) (10/05 1345)  Intake/Output from previous day: 10/05 0701 - 10/06 0700 In: 1382.9 [P.O.:480; I.V.:802.9; IV Piggyback:100] Out: 35 [Drains:35] Intake/Output this shift: No intake/output data recorded.  Dressing just changed. 35 cc out the last 12 hours from his JP drain  Lab Results:  Recent Labs  04/30/17 0550 05/01/17 0339  WBC 7.6 6.5  HGB 8.5* 7.6*  HCT 26.3* 24.7*  PLT 231 208   BMET  Recent Labs  04/30/17 0550 05/01/17 0339  NA 133* 132*  K 5.7* 4.1  CL 101 94*  CO2 17* 24  GLUCOSE 115* 133*  BUN 75* 35*  CREATININE 10.85* 6.92*  CALCIUM 9.0 8.0*    Studies/Results: Dg Chest Port 1 View  Result Date: 05/01/2017 CLINICAL DATA:  Pacemaker placement. EXAM: PORTABLE CHEST 1 VIEW COMPARISON:  Chest x-ray dated April 29, 2017. FINDINGS: Interval placement of a dual lead right chest wall AICD with leads terminating in the right atrium and right ventricle. Unchanged left internal jugular tunnel dialysis catheter with tip in the right atrium. Interval removal of endotracheal tube. The cardiomediastinal silhouette is normal in size. Normal pulmonary vascularity. Bibasilar atelectasis. No focal consolidation, pleural effusion, or pneumothorax. No acute osseous abnormality. IMPRESSION: Right chest wall AICD placement.  No pneumothorax. Electronically Signed   By: Titus Dubin M.D.   On: 05/01/2017 08:50   Dg Chest Port 1 View  Result Date: 04/29/2017 CLINICAL DATA:  Status post intubation. EXAM: PORTABLE  CHEST 1 VIEW COMPARISON:  11/13/2016 FINDINGS: Left subclavian dialysis catheter is noted with tip in the low right atrium. Status post ETT tip. The tip of the ET tube appears to be 2.4 cm above the carina. Normal heart size. There is no pleural effusion or edema. No airspace opacities. IMPRESSION: Tip of the ET tube is 2.4 cm above the carina. Electronically Signed   By: Kerby Moors M.D.   On: 04/29/2017 20:23   Anti-infectives: Anti-infectives    Start     Dose/Rate Route Frequency Ordered Stop   05/01/17 1800  cefUROXime (ZINACEF) 750 mg in dextrose 5 % 50 mL IVPB     750 mg 100 mL/hr over 30 Minutes Intravenous Every 24 hours 04/30/17 0948     04/30/17 2230  ceFAZolin (ANCEF) IVPB 1 g/50 mL premix  Status:  Discontinued     1 g 100 mL/hr over 30 Minutes Intravenous Every 6 hours 04/30/17 1834 05/01/17 0905   04/30/17 2100  ceFAZolin (ANCEF) IVPB 1 g/50 mL premix  Status:  Discontinued     1 g 100 mL/hr over 30 Minutes Intravenous Every 6 hours 04/30/17 1834 04/30/17 1913   04/30/17 1015  gentamicin (GARAMYCIN) 80 mg in sodium chloride irrigation 0.9 % 500 mL irrigation     80 mg Irrigation On call 04/30/17 1008 04/30/17 1734   04/30/17 1015  ceFAZolin (ANCEF) IVPB 2g/100 mL premix     2 g 200 mL/hr over 30 Minutes Intravenous On call 04/30/17 1008 04/30/17 1716   04/29/17 2200  cefUROXime (ZINACEF) 1.5 g in dextrose  5 % 50 mL IVPB  Status:  Discontinued     1.5 g 100 mL/hr over 30 Minutes Intravenous Every 12 hours 04/29/17 2006 04/30/17 0948   04/29/17 1343  ceFAZolin (ANCEF) 1-4 GM/50ML-% IVPB    Comments:  Laurita Quint   : cabinet override      04/29/17 1343 04/29/17 1432   04/29/17 1130  ceFAZolin (ANCEF) IVPB 1 g/50 mL premix    Comments:  Send with pt to OR   1 g 100 mL/hr over 30 Minutes Intravenous On call 04/29/17 1119 04/29/17 1432      Assessment/Plan: s/p Procedure(s): PACEMAKER IMPLANT (N/A) Stable overall. Will transfer to renal floor today. Possible discharge  tomorrow if no new cardiac issues.   LOS: 2 days   Katryn Plummer 05/01/2017, 9:35 AM

## 2017-05-02 LAB — CBC
HEMATOCRIT: 28.6 % — AB (ref 39.0–52.0)
HEMOGLOBIN: 8.9 g/dL — AB (ref 13.0–17.0)
MCH: 27.4 pg (ref 26.0–34.0)
MCHC: 31.1 g/dL (ref 30.0–36.0)
MCV: 88 fL (ref 78.0–100.0)
Platelets: 229 10*3/uL (ref 150–400)
RBC: 3.25 MIL/uL — ABNORMAL LOW (ref 4.22–5.81)
RDW: 17.2 % — AB (ref 11.5–15.5)
WBC: 5.9 10*3/uL (ref 4.0–10.5)

## 2017-05-02 LAB — GLUCOSE, CAPILLARY
GLUCOSE-CAPILLARY: 112 mg/dL — AB (ref 65–99)
GLUCOSE-CAPILLARY: 116 mg/dL — AB (ref 65–99)
Glucose-Capillary: 167 mg/dL — ABNORMAL HIGH (ref 65–99)

## 2017-05-02 LAB — PROTIME-INR
INR: 1.79
PROTHROMBIN TIME: 20.7 s — AB (ref 11.4–15.2)

## 2017-05-02 MED ORDER — FAMOTIDINE 20 MG PO TABS: 20.0000 mg | ORAL_TABLET | Freq: Every day | ORAL | Status: DC

## 2017-05-02 NOTE — Progress Notes (Signed)
[facility] Progress Note   Subjective: C/O soreness L arm R chest. Denies SOB.Wife at bedside.   Objective Vitals:   05/01/17 1626 05/01/17 2021 05/02/17 0630 05/02/17 0838  BP: 110/72 (!) 98/58 119/75 130/74  Pulse: 77 80 73 74  Resp:  20 18 16   Temp:  98.9 F (37.2 C) (!) 97.5 F (36.4 C) 99 F (37.2 C)  TempSrc:  Oral Oral Oral  SpO2:  100% 100% 100%  Weight:  97.3 kg (214 lb 9.6 oz)     Physical Exam General: WNWD NAD Heart: S1,S2, + S4  No JVD. Steri strips intact PPM site-CDI.  Lungs: CTAB A/P Abdomen: Active BS Extremities: BLE BKA no stump edema Dialysis Access: LIJ TDC Drsg CDI, Maturing LUA AVF now with guaze drsg JP drain with serous drainage. + bruit.     Additional Objective Labs: Basic Metabolic Panel:  Recent Labs Lab 04/30/17 0006 04/30/17 0550 05/01/17 0339  NA 134* 133* 132*  K 6.0* 5.7* 4.1  CL 99* 101 94*  CO2 18* 17* 24  GLUCOSE 168* 115* 133*  BUN 76* 75* 35*  CREATININE 10.71* 10.85* 6.92*  CALCIUM 9.0 9.0 8.0*   Liver Function Tests:  Recent Labs Lab 04/29/17 0308 04/30/17 0006  AST 17 25  ALT 12* 15*  ALKPHOS 62 53  BILITOT 0.5 0.6  PROT 7.8 6.9  ALBUMIN 3.5 3.0*   No results for input(s): LIPASE, AMYLASE in the last 168 hours. CBC:  Recent Labs Lab 04/29/17 0308 04/30/17 0006 04/30/17 0550 05/01/17 0339  WBC 7.1 5.4 7.6 6.5  NEUTROABS 4.6  --   --   --   HGB 10.6* 8.5* 8.5* 7.6*  HCT 32.8* 26.4* 26.3* 24.7*  MCV 87.2 85.7 84.8 86.4  PLT 249 242 231 208   Blood Culture    Component Value Date/Time   SDES BLOOD RIGHT ARM 11/11/2016 1142   SPECREQUEST  11/11/2016 1142    BOTTLES DRAWN AEROBIC AND ANAEROBIC Blood Culture adequate volume   CULT NO GROWTH 5 DAYS 11/11/2016 1142   REPTSTATUS 11/16/2016 FINAL 11/11/2016 1142    Cardiac Enzymes:  Recent Labs Lab 04/29/17 2009 04/30/17 0006 04/30/17 0550  TROPONINI 0.12* 0.21* 0.29*   CBG:  Recent Labs Lab 05/01/17 1204 05/01/17 1822  05/01/17 2237 05/02/17 0739 05/02/17 0827  GLUCAP 167* 98 159* 112* 116*   Iron Studies: No results for input(s): IRON, TIBC, TRANSFERRIN, FERRITIN in the last 72 hours. @lablastinr3 @ Studies/Results: Dg Chest Port 1 View  Result Date: 05/01/2017 CLINICAL DATA:  Pacemaker placement. EXAM: PORTABLE CHEST 1 VIEW COMPARISON:  Chest x-ray dated April 29, 2017. FINDINGS: Interval placement of a dual lead right chest wall AICD with leads terminating in the right atrium and right ventricle. Unchanged left internal jugular tunnel dialysis catheter with tip in the right atrium. Interval removal of endotracheal tube. The cardiomediastinal silhouette is normal in size. Normal pulmonary vascularity. Bibasilar atelectasis. No focal consolidation, pleural effusion, or pneumothorax. No acute osseous abnormality. IMPRESSION: Right chest wall AICD placement.  No pneumothorax. Electronically Signed   By: Titus Dubin M.D.   On: 05/01/2017 08:50   Medications: . sodium chloride    . cefUROXime (ZINACEF)  IV Stopped (05/01/17 2206)  . famotidine (PEPCID) IV Stopped (05/01/17 1904)   . atorvastatin  40 mg Oral q1800  . calcium acetate  1,334 mg Oral TID WC  . cinacalcet  30 mg Oral QPM  . heparin 1000 unit irrigation   Irrigation Once  . insulin  aspart  0-9 Units Subcutaneous TID WC  . latanoprost  1 drop Both Eyes QHS  . pantoprazole  40 mg Oral Daily  . senna  1 tablet Oral BID  . sodium chloride flush  3 mL Intravenous Q12H     HD orders: T,Th,S [place] 3 hr 45 min 180 NRe  450/800 84 kg 2.0 K/2.5 Ca UF profile 4 -Heparin 2800 units IV initial bolus Heparin 2000 units IV run -Hectoral 2 mcg IV TIW (last PTH 368 04/22/17) -Venofer 50 mg IV weekly (last dose 04/22/17 Last Fe 51 Tsat 30% 04/22/17)  BMD meds:  -Calcium Acetate 667 mg 3 caps PO TID AC -Velphoro 500 mg PO TID AC (Last Phos 6.3 Ca 8.7 C Ca 8.9 04/22/17)   Assessment/Plan: 1.  Acute Respiratory Failure: Extubated to nasal cannula  04/30/17. Resolved.  2.  CHB: S/P PPM-Dual chamber PM.  3.  ESRD -  T,Th,S. Next HD 05/04/17. Had HD off schedule 10/05 and again 10/06.  4.  Hypertension/volume  - BP controlled. No oral antihypertensive meds ordered.   5.  Anemia  - HGB 7.6. Rec'd Has not been on ESA at OP center. Rec'd Aranesp 60 mg 04/30/17. Recheck CBC in AM.  6.  Metabolic bone disease - Cont VDRA, Binders. Check labs next HD. Ca 8.0 C Ca 8.8.  7.  Nutrition -Albumin 3.0 Renal/Carb mod diet, prostat, renal vit   8. DM-per primary.  9. NICM: monitor volume closely.  10. H/O PAF- coumadin has not been resumed yet. per [place]. Brown NP-C 05/02/2017, 10:31 AM  [facility] [phone]  Pt seen, examined and agree w A/P as above. Likely for dc today per [facility].   Kelly Splinter MD [facility] pager [phone]   05/02/2017, 11:51 AM

## 2017-05-02 NOTE — Progress Notes (Signed)
Oscar Castillo to be D/C'd Home per MD order.  Discussed prescriptions and follow up appointments with the patient. Prescriptions given to patient, medication list explained in detail. Pt verbalized understanding.  Allergies as of 05/02/2017      Reactions   No Known Allergies       Medication List    TAKE these medications   acetaminophen 500 MG tablet Commonly known as:  TYLENOL Take 500 mg by mouth every 6 (six) hours as needed for mild pain.   amiodarone 200 MG tablet Commonly known as:  PACERONE TAKE 1 TABLET BY MOUTH DAILY, START THIS ON 11/06/16   atorvastatin 40 MG tablet Commonly known as:  LIPITOR TAKE 1 TABLET(40 MG) BY MOUTH DAILY AT 6 PM   latanoprost 0.005 % ophthalmic solution Commonly known as:  XALATAN Place 1 drop into both eyes at bedtime.   oxyCODONE-acetaminophen 5-325 MG tablet Commonly known as:  ROXICET Take 1 tablet by mouth every 4 (four) hours as needed for moderate pain.   pantoprazole 40 MG tablet Commonly known as:  PROTONIX Take 1 tablet (40 mg total) by mouth 2 (two) times daily before a meal.   PHOSLO 667 MG capsule Generic drug:  calcium acetate Take 1,334 mg by mouth 3 (three) times daily with meals.   QUEtiapine 25 MG tablet Commonly known as:  SEROQUEL TAKE 1 TABLET(25 MG) BY MOUTH TWICE DAILY   SENSIPAR 30 MG tablet Generic drug:  cinacalcet Take 30 mg by mouth every evening.   silver sulfADIAZINE 1 % cream Commonly known as:  SILVADENE Apply 1 application topically daily as needed (irratation).   SSD 1 % cream Generic drug:  silver sulfADIAZINE APPLY EXTERNALLY TO THE AFFECTED AREA DAILY   traMADol 50 MG tablet Commonly known as:  ULTRAM Take 50 mg by mouth every 6 (six) hours as needed for moderate pain.   warfarin 2.5 MG tablet Commonly known as:  COUMADIN Take 1 tablet daily except 1.5 tablets on Wed and Fri or as directed by Anticoagulation Clinic What changed:  how much to take  how to take this  when to take  this  additional instructions       Vitals:   05/02/17 0630 05/02/17 0838  BP: 119/75 130/74  Pulse: 73 74  Resp: 18 16  Temp: (!) 97.5 F (36.4 C) 99 F (37.2 C)  SpO2: 100% 100%    Skin clean, dry and intact without evidence of skin break down, no evidence of skin tears noted. IV catheter discontinued intact. Site without signs and symptoms of complications. Dressing and pressure applied. Pt denies pain at this time. No complaints noted.  An After Visit Summary was printed and given to the patient. Patient escorted via [place], and D/C home via private auto.  Oscar Math, RN [facility] Phone [phone]

## 2017-05-02 NOTE — Progress Notes (Signed)
Patient ID: Oscar Castillo, male   DOB: 1951/03/21, 66 y.o.   MRN: [id number] Minimal drainage out of his JP drain. No hematoma noted. Stable for discharge from cardiac standpoint. We'll discharge today in follow-up with Dr. Bridgett Larsson

## 2017-05-03 ENCOUNTER — Encounter (HOSPITAL_COMMUNITY): Payer: Self-pay | Admitting: Internal Medicine

## 2017-05-03 MED FILL — Bupivacaine HCl Preservative Free (PF) Inj 0.25%: INTRAMUSCULAR | Qty: 30 | Status: AC

## 2017-05-04 ENCOUNTER — Telehealth: Payer: Self-pay | Admitting: Internal Medicine

## 2017-05-04 DIAGNOSIS — E1122 Type 2 diabetes mellitus with diabetic chronic kidney disease: Secondary | ICD-10-CM | POA: Diagnosis not present

## 2017-05-04 DIAGNOSIS — D631 Anemia in chronic kidney disease: Secondary | ICD-10-CM | POA: Diagnosis not present

## 2017-05-04 DIAGNOSIS — N186 End stage renal disease: Secondary | ICD-10-CM | POA: Diagnosis not present

## 2017-05-04 DIAGNOSIS — N2581 Secondary hyperparathyroidism of renal origin: Secondary | ICD-10-CM | POA: Diagnosis not present

## 2017-05-04 DIAGNOSIS — Z4802 Encounter for removal of sutures: Secondary | ICD-10-CM | POA: Diagnosis not present

## 2017-05-04 NOTE — Telephone Encounter (Signed)
Ok for verbals 

## 2017-05-04 NOTE — Telephone Encounter (Signed)
Patient was in hospital and was not sent home with nursing orders. They would like verbal orders to go and evaluate patient for nursing care.   [phone] - Amy

## 2017-05-04 NOTE — Telephone Encounter (Signed)
Verbal orders given  

## 2017-05-05 DIAGNOSIS — R2689 Other abnormalities of gait and mobility: Secondary | ICD-10-CM | POA: Diagnosis not present

## 2017-05-05 DIAGNOSIS — N186 End stage renal disease: Secondary | ICD-10-CM | POA: Diagnosis not present

## 2017-05-05 DIAGNOSIS — D631 Anemia in chronic kidney disease: Secondary | ICD-10-CM | POA: Diagnosis not present

## 2017-05-05 DIAGNOSIS — I132 Hypertensive heart and chronic kidney disease with heart failure and with stage 5 chronic kidney disease, or end stage renal disease: Secondary | ICD-10-CM | POA: Diagnosis not present

## 2017-05-05 DIAGNOSIS — E1122 Type 2 diabetes mellitus with diabetic chronic kidney disease: Secondary | ICD-10-CM | POA: Diagnosis not present

## 2017-05-05 DIAGNOSIS — I5042 Chronic combined systolic (congestive) and diastolic (congestive) heart failure: Secondary | ICD-10-CM | POA: Diagnosis not present

## 2017-05-06 ENCOUNTER — Emergency Department (HOSPITAL_COMMUNITY): Payer: Medicare Other

## 2017-05-06 ENCOUNTER — Other Ambulatory Visit (HOSPITAL_COMMUNITY): Payer: Self-pay

## 2017-05-06 ENCOUNTER — Inpatient Hospital Stay (HOSPITAL_COMMUNITY)
Admission: EM | Admit: 2017-05-06 | Discharge: 2017-05-09 | DRG: 871 | Disposition: A | Discharge status: Home Health | Payer: Medicare Other | Attending: Family Medicine | Admitting: Family Medicine

## 2017-05-06 ENCOUNTER — Other Ambulatory Visit: Payer: Self-pay

## 2017-05-06 DIAGNOSIS — Z905 Acquired absence of kidney: Secondary | ICD-10-CM

## 2017-05-06 DIAGNOSIS — Z8619 Personal history of other infectious and parasitic diseases: Secondary | ICD-10-CM

## 2017-05-06 DIAGNOSIS — Z8673 Personal history of transient ischemic attack (TIA), and cerebral infarction without residual deficits: Secondary | ICD-10-CM

## 2017-05-06 DIAGNOSIS — I480 Paroxysmal atrial fibrillation: Secondary | ICD-10-CM | POA: Diagnosis not present

## 2017-05-06 DIAGNOSIS — L89159 Pressure ulcer of sacral region, unspecified stage: Secondary | ICD-10-CM | POA: Diagnosis not present

## 2017-05-06 DIAGNOSIS — D631 Anemia in chronic kidney disease: Secondary | ICD-10-CM | POA: Diagnosis present

## 2017-05-06 DIAGNOSIS — Z7401 Bed confinement status: Secondary | ICD-10-CM

## 2017-05-06 DIAGNOSIS — K5900 Constipation, unspecified: Secondary | ICD-10-CM | POA: Diagnosis present

## 2017-05-06 DIAGNOSIS — E1140 Type 2 diabetes mellitus with diabetic neuropathy, unspecified: Secondary | ICD-10-CM | POA: Diagnosis present

## 2017-05-06 DIAGNOSIS — R400 Somnolence: Secondary | ICD-10-CM | POA: Diagnosis not present

## 2017-05-06 DIAGNOSIS — Z794 Long term (current) use of insulin: Secondary | ICD-10-CM | POA: Diagnosis not present

## 2017-05-06 DIAGNOSIS — I132 Hypertensive heart and chronic kidney disease with heart failure and with stage 5 chronic kidney disease, or end stage renal disease: Secondary | ICD-10-CM | POA: Diagnosis present

## 2017-05-06 DIAGNOSIS — K5903 Drug induced constipation: Secondary | ICD-10-CM | POA: Diagnosis not present

## 2017-05-06 DIAGNOSIS — Z87442 Personal history of urinary calculi: Secondary | ICD-10-CM

## 2017-05-06 DIAGNOSIS — Z79899 Other long term (current) drug therapy: Secondary | ICD-10-CM

## 2017-05-06 DIAGNOSIS — K219 Gastro-esophageal reflux disease without esophagitis: Secondary | ICD-10-CM | POA: Diagnosis present

## 2017-05-06 DIAGNOSIS — R451 Restlessness and agitation: Secondary | ICD-10-CM | POA: Diagnosis present

## 2017-05-06 DIAGNOSIS — Z7901 Long term (current) use of anticoagulants: Secondary | ICD-10-CM

## 2017-05-06 DIAGNOSIS — N186 End stage renal disease: Secondary | ICD-10-CM

## 2017-05-06 DIAGNOSIS — N2581 Secondary hyperparathyroidism of renal origin: Secondary | ICD-10-CM | POA: Diagnosis present

## 2017-05-06 DIAGNOSIS — Z992 Dependence on renal dialysis: Secondary | ICD-10-CM | POA: Diagnosis not present

## 2017-05-06 DIAGNOSIS — Z950 Presence of cardiac pacemaker: Secondary | ICD-10-CM

## 2017-05-06 DIAGNOSIS — K5641 Fecal impaction: Secondary | ICD-10-CM | POA: Diagnosis present

## 2017-05-06 DIAGNOSIS — Z6834 Body mass index (BMI) 34.0-34.9, adult: Secondary | ICD-10-CM

## 2017-05-06 DIAGNOSIS — T43595A Adverse effect of other antipsychotics and neuroleptics, initial encounter: Secondary | ICD-10-CM | POA: Diagnosis not present

## 2017-05-06 DIAGNOSIS — K8020 Calculus of gallbladder without cholecystitis without obstruction: Secondary | ICD-10-CM | POA: Diagnosis not present

## 2017-05-06 DIAGNOSIS — I5042 Chronic combined systolic (congestive) and diastolic (congestive) heart failure: Secondary | ICD-10-CM | POA: Diagnosis not present

## 2017-05-06 DIAGNOSIS — Y92230 Patient room in hospital as the place of occurrence of the external cause: Secondary | ICD-10-CM | POA: Diagnosis not present

## 2017-05-06 DIAGNOSIS — I442 Atrioventricular block, complete: Secondary | ICD-10-CM | POA: Diagnosis present

## 2017-05-06 DIAGNOSIS — F319 Bipolar disorder, unspecified: Secondary | ICD-10-CM | POA: Diagnosis present

## 2017-05-06 DIAGNOSIS — A419 Sepsis, unspecified organism: Principal | ICD-10-CM | POA: Diagnosis present

## 2017-05-06 DIAGNOSIS — R069 Unspecified abnormalities of breathing: Secondary | ICD-10-CM | POA: Diagnosis not present

## 2017-05-06 DIAGNOSIS — Z4802 Encounter for removal of sutures: Secondary | ICD-10-CM | POA: Diagnosis not present

## 2017-05-06 DIAGNOSIS — Z89511 Acquired absence of right leg below knee: Secondary | ICD-10-CM

## 2017-05-06 DIAGNOSIS — Z89512 Acquired absence of left leg below knee: Secondary | ICD-10-CM

## 2017-05-06 DIAGNOSIS — E1122 Type 2 diabetes mellitus with diabetic chronic kidney disease: Secondary | ICD-10-CM

## 2017-05-06 DIAGNOSIS — R002 Palpitations: Secondary | ICD-10-CM | POA: Diagnosis not present

## 2017-05-06 DIAGNOSIS — L98429 Non-pressure chronic ulcer of back with unspecified severity: Secondary | ICD-10-CM | POA: Diagnosis present

## 2017-05-06 DIAGNOSIS — L8990 Pressure ulcer of unspecified site, unspecified stage: Secondary | ICD-10-CM

## 2017-05-06 DIAGNOSIS — R0602 Shortness of breath: Secondary | ICD-10-CM | POA: Diagnosis not present

## 2017-05-06 DIAGNOSIS — T402X5A Adverse effect of other opioids, initial encounter: Secondary | ICD-10-CM | POA: Diagnosis not present

## 2017-05-06 DIAGNOSIS — L089 Local infection of the skin and subcutaneous tissue, unspecified: Secondary | ICD-10-CM

## 2017-05-06 DIAGNOSIS — J9811 Atelectasis: Secondary | ICD-10-CM | POA: Diagnosis present

## 2017-05-06 DIAGNOSIS — I482 Chronic atrial fibrillation: Secondary | ICD-10-CM | POA: Diagnosis present

## 2017-05-06 DIAGNOSIS — N400 Enlarged prostate without lower urinary tract symptoms: Secondary | ICD-10-CM | POA: Diagnosis present

## 2017-05-06 DIAGNOSIS — E1121 Type 2 diabetes mellitus with diabetic nephropathy: Secondary | ICD-10-CM | POA: Diagnosis present

## 2017-05-06 DIAGNOSIS — E889 Metabolic disorder, unspecified: Secondary | ICD-10-CM | POA: Diagnosis present

## 2017-05-06 DIAGNOSIS — E11622 Type 2 diabetes mellitus with other skin ulcer: Secondary | ICD-10-CM | POA: Diagnosis present

## 2017-05-06 DIAGNOSIS — R791 Abnormal coagulation profile: Secondary | ICD-10-CM | POA: Diagnosis present

## 2017-05-06 DIAGNOSIS — Z8674 Personal history of sudden cardiac arrest: Secondary | ICD-10-CM

## 2017-05-06 LAB — CBC WITH DIFFERENTIAL/PLATELET
BASOS ABS: 0 10*3/uL (ref 0.0–0.1)
Basophils Relative: 0 %
EOS PCT: 2 %
Eosinophils Absolute: 0.1 10*3/uL (ref 0.0–0.7)
HEMATOCRIT: 23.8 % — AB (ref 39.0–52.0)
Hemoglobin: 7.3 g/dL — ABNORMAL LOW (ref 13.0–17.0)
LYMPHS ABS: 1.4 10*3/uL (ref 0.7–4.0)
LYMPHS PCT: 20 %
MCH: 27.4 pg (ref 26.0–34.0)
MCHC: 30.7 g/dL (ref 30.0–36.0)
MCV: 89.5 fL (ref 78.0–100.0)
Monocytes Absolute: 0.8 10*3/uL (ref 0.1–1.0)
Monocytes Relative: 12 %
NEUTROS ABS: 4.8 10*3/uL (ref 1.7–7.7)
Neutrophils Relative %: 66 %
Platelets: 287 10*3/uL (ref 150–400)
RBC: 2.66 MIL/uL — AB (ref 4.22–5.81)
RDW: 18.1 % — ABNORMAL HIGH (ref 11.5–15.5)
WBC: 7.2 10*3/uL (ref 4.0–10.5)

## 2017-05-06 LAB — POC OCCULT BLOOD, ED: Fecal Occult Bld: NEGATIVE

## 2017-05-06 LAB — COMPREHENSIVE METABOLIC PANEL
ALK PHOS: 56 U/L (ref 38–126)
AST: 19 U/L (ref 15–41)
Albumin: 3.1 g/dL — ABNORMAL LOW (ref 3.5–5.0)
Anion gap: 10 (ref 5–15)
BILIRUBIN TOTAL: 0.7 mg/dL (ref 0.3–1.2)
BUN: 24 mg/dL — AB (ref 6–20)
CALCIUM: 8.6 mg/dL — AB (ref 8.9–10.3)
CO2: 25 mmol/L (ref 22–32)
CREATININE: 5.7 mg/dL — AB (ref 0.61–1.24)
Chloride: 98 mmol/L — ABNORMAL LOW (ref 101–111)
GFR, EST AFRICAN AMERICAN: 11 mL/min — AB (ref >60)
GFR, EST NON AFRICAN AMERICAN: 9 mL/min — AB (ref >60)
Glucose, Bld: 120 mg/dL — ABNORMAL HIGH (ref 65–99)
Potassium: 3.6 mmol/L (ref 3.5–5.1)
Sodium: 133 mmol/L — ABNORMAL LOW (ref 135–145)
TOTAL PROTEIN: 7.4 g/dL (ref 6.5–8.1)

## 2017-05-06 LAB — I-STAT CG4 LACTIC ACID, ED
LACTIC ACID, VENOUS: 1.64 mmol/L (ref 0.5–1.9)
Lactic Acid, Venous: 1.94 mmol/L — ABNORMAL HIGH (ref 0.5–1.9)

## 2017-05-06 LAB — I-STAT TROPONIN, ED: TROPONIN I, POC: 0.04 ng/mL (ref 0.00–0.08)

## 2017-05-06 LAB — I-STAT CHEM 8, ED
BUN: 28 mg/dL — AB (ref 6–20)
CALCIUM ION: 1.04 mmol/L — AB (ref 1.15–1.40)
CHLORIDE: 99 mmol/L — AB (ref 101–111)
CREATININE: 5.8 mg/dL — AB (ref 0.61–1.24)
GLUCOSE: 122 mg/dL — AB (ref 65–99)
HCT: 32 % — ABNORMAL LOW (ref 39.0–52.0)
Hemoglobin: 10.9 g/dL — ABNORMAL LOW (ref 13.0–17.0)
Potassium: 3.6 mmol/L (ref 3.5–5.1)
SODIUM: 138 mmol/L (ref 135–145)
TCO2: 27 mmol/L (ref 22–32)

## 2017-05-06 LAB — PROTIME-INR
INR: 1.68
Prothrombin Time: 19.7 seconds — ABNORMAL HIGH (ref 11.4–15.2)

## 2017-05-06 MED ORDER — VANCOMYCIN HCL 10 G IV SOLR
2000.0000 mg | Freq: Once | INTRAVENOUS | Status: AC
  Administered 2017-05-06: 2000 mg via INTRAVENOUS
  Filled 2017-05-06: qty 2000

## 2017-05-06 MED ORDER — PIPERACILLIN-TAZOBACTAM 3.375 G IVPB
3.3750 g | Freq: Two times a day (BID) | INTRAVENOUS | Status: DC
  Administered 2017-05-06 – 2017-05-07 (×3): 3.375 g via INTRAVENOUS
  Filled 2017-05-06 (×5): qty 50

## 2017-05-06 MED ORDER — MORPHINE SULFATE (PF) 4 MG/ML IV SOLN
4.0000 mg | Freq: Once | INTRAVENOUS | Status: AC
  Administered 2017-05-06: 4 mg via INTRAVENOUS
  Filled 2017-05-06: qty 1

## 2017-05-06 MED ORDER — VANCOMYCIN HCL IN DEXTROSE 1-5 GM/200ML-% IV SOLN
1000.0000 mg | INTRAVENOUS | Status: DC
  Administered 2017-05-08: 1000 mg via INTRAVENOUS
  Filled 2017-05-06: qty 200

## 2017-05-06 NOTE — Progress Notes (Deleted)
    Postoperative Access Visit   History of Present Illness   Oscar Castillo is a 66 y.o. year old male who presents for postoperative follow-up for evacuation of Left arm hematoma (04/29/17).  This was complicated by total heart block post-op leading to placement of a PPM.  L arm wound: ***  Physical Examination  ***There were no vitals filed for this visit. ***There is no height or weight on file to calculate BMI.  {side of body:[id number]} arm Incision is *** healed, skin feels ***, hand grip is ***/5, sensation in digits is *** intact, ***palpable thrill, bruit can *** be auscultated     Medical Decision Making   Oscar Castillo is a 66 y.o. year old male who presents s/p left second stage brachial vein transposition complicated by hematoma requiring evacuation complicated by total heart block requiring PPM   The patient's access is *** ready for use.  ***The patient's tunneled dialysis catheter can be removed after two successful cannulations and completed dialysis treatments.  Thank you for allowing [place] to participate in this patient's care.   Adele Barthel, MD, FACS [facility] Office: [phone] Pager: [phone]

## 2017-05-06 NOTE — ED Notes (Addendum)
Pt sleeping but wakes up when RN enters the room to complain about position and pain. This RN shifted pt from side to side on several occassions. Pt resting comfortably when he is alone in room.

## 2017-05-06 NOTE — ED Triage Notes (Signed)
Pt arrived via [facility] with a c/c of SOB. Pt is a dialysis pt with recent shunt placement (04/21/17) in the left arm. Pt also had recent pacemaker placed (04/30/17). Pt not c/o of sob upon arrival to ED. Initial EMS SPo2 98% room air. Pt c/o pain in the sacral area. Not visualized prior to this note.

## 2017-05-06 NOTE — ED Notes (Signed)
ED Provider at bedside. 

## 2017-05-06 NOTE — ED Notes (Signed)
Pt cleaned of feces and 2 skin sites with dime-sized stage 1 breakdown dressed with barrier cream. EDP notified.

## 2017-05-06 NOTE — H&P (Addendum)
History and Physical    Oscar Castillo WIO:[id number] DOB: 28-Jun-1951 DOA: 05/06/2017  Referring MD/NP/PA: Dr. Payton Castillo PCP: Oscar Borg, MD  Patient coming from: home via EMS  Chief Complaint: Pain   HPI: Oscar Castillo is a 66 y.o. male with medical history significant of ESRD on HD(MWF), CHF EF 55-60% with grade 1 dFx, Afib on amiodarone and warfarin, NIDDM, and bilateral BKA; who presents with complaints of rectal pain that was acutely worse today. Patient is agitated and mostly complaining of pain and discomfort so some of history is obtained from the patient's wife. At baseline the patient is bedbound. He had just completed  a full dialysis session today and wife notes that the patient was complaining of pulling short of breath for which she called EMS. She states that his breathing was rapid and the patient also complained of being hot so she had turned on the air conditioning on in their home although she reports that it already felt cool in the home. Other associated symptoms include complaints of left arm pain, constipation, and loose stools. Patient had had a loose bowel movement prior to EMS being called.  Patient just was hospitalized from 10/4- 10/7 for what was thought to be bleeding from his left dialysis shunt with hematoma that had just been placed on 9/26. Patient underwent evacuation of the hematoma with JP drain placement on 10/4. It appears subsequently patient went into complete heart block with loss of pulse. ACLS started with chest compressions patient was defibrillated with ROSC, then went into V. Tach with no pulse for which ACLS and compressions were resumed was defibrillated 2 more times with ROSC. Patient was temporary paced until pacemaker implantation 10/5. Seems that the patient JP drain was replaced at discharge and he was advised to restart on Coumadin.  ED Course: EMS noted his O2 saturations were maintained on room air. Upon admission into the emergency department  patient was noted to have a rectal temperature of 99.18F, pulse 70-87, blood pressures 91/58-150/78, and O2 saturations maintain on room air. Labs revealed WBC 7.2, hemoglobin 7.3, sodium 133, potassium 3.6, BUN 24, creatinine 5.7, INR 1.68, and lactic acid 1.94. Patient was noted to be negative for fecal occult blood. Due to initial low blood pressure, low-grade temperature, and new sacral ulcers patient was empirically started on sepsis protocol with blood cultures obtained. His empirically given antibiotics of vancomycin and Zosyn. Chest x-ray revealed shallow inspi aspiration with atelectasis. CT scan of the abdomen revealed a large tumor burden with impaction in the distal colon.  Review of Systems  Constitutional: Positive for fever.  HENT: Negative for ear discharge and nosebleeds.   Eyes: Negative for photophobia and pain.  Respiratory: Positive for shortness of breath. Negative for cough and hemoptysis.   Cardiovascular: Negative for orthopnea.  Gastrointestinal: Positive for constipation and diarrhea. Negative for nausea and vomiting.  Genitourinary: Negative for dysuria and frequency.  Musculoskeletal: Positive for myalgias. Negative for falls.  Skin: Negative for itching.       Positive for Wound on sacrum  Neurological: Negative for speech change and focal weakness.  Psychiatric/Behavioral: Negative for hallucinations and substance abuse.    Past Medical History:  Diagnosis Date  . Anemia   . Antral ulcer 2014   small  . BENIGN PROSTATIC HYPERTROPHY   . Bipolar disorder ([place])    "sometimes" (10/07/2016)  . CHOLELITHIASIS   . Chronic combined systolic and diastolic CHF (congestive heart failure) ([place])   . Complication of  anesthesia    wife states pt had trouble waking up with in Nov., 2014  . CVA (cerebral vascular accident) ([place]) 07/2007  . DEPRESSION   . DIABETES MELLITUS, TYPE II    diet control  . ERECTILE DYSFUNCTION   . ESRD on hemodialysis ([place])    ESRD due to  DM/HTN. Started dialysis in November 2013.  HD TTS at [facility] [address].  [name] GERD   . GI bleed    due to gastritis, discharged 10/02/16/notes 10/07/2016  . Hepatitis C    C - has been treated  . History of Clostridium difficile   . History of kidney stones   . HYPERTENSION   . LBBB (left bundle branch block)   . Morbid obesity ([place])   . PAF (paroxysmal atrial fibrillation) ([place])    a. Dx 12/2015.    Past Surgical History:  Procedure Laterality Date  . AMPUTATION Left 05/12/2013   Procedure: AMPUTATION RAY;  Surgeon: Newt Minion, MD;  Location: [place];  Service: Orthopedics;  Laterality: Left;  Left Foot 1st Ray Amputation  . AMPUTATION Left 06/09/2013   Procedure: AMPUTATION BELOW KNEE;  Surgeon: Newt Minion, MD;  Location: [place];  Service: Orthopedics;  Laterality: Left;  Left Below Knee Amputation and removal proximal screws IM tibial nail  . AMPUTATION Right 09/08/2013   Procedure: AMPUTATION BELOW KNEE;  Surgeon: Newt Minion, MD;  Location: [place];  Service: Orthopedics;  Laterality: Right;  Right Below Knee Amputation  . AMPUTATION Right 10/11/2013   Procedure: AMPUTATION BELOW KNEE;  Surgeon: Newt Minion, MD;  Location: [place];  Service: Orthopedics;  Laterality: Right;  Right Below Knee Amputation Revision  . AV FISTULA PLACEMENT  06/14/2012   Procedure: ARTERIOVENOUS (AV) FISTULA CREATION;  Surgeon: Angelia Mould, MD;  [facility] OR;  Service: Vascular;  Laterality: Left;  Left basilic vein transposition with fistula.  [name] BASCILIC VEIN TRANSPOSITION Left 02/17/2017   Procedure: LEFT 1ST STAGE BRACHIAL VEIN TRANSPOSITION;  Surgeon: Conrad Lockridge, MD;  Location: [place];  Service: Vascular;  Laterality: Left;  . BASCILIC VEIN TRANSPOSITION Left 04/21/2017   Procedure: LEFT 2ND STAGE BRACHIAL VEIN TRANSPOSITION;  Surgeon: Conrad Witherbee, MD;  Location: [place];  Service: Vascular;  Laterality: Left;  . COLONOSCOPY N/A 10/28/2012   Procedure: COLONOSCOPY;  Surgeon: Jeryl Columbia, MD;  Location: [facility] ENDOSCOPY;  Service: Endoscopy;  Laterality: N/A;  . COLONOSCOPY N/A 11/02/2012   Procedure: COLONOSCOPY;  Surgeon: Cleotis Nipper, MD;  Location: [facility] ENDOSCOPY;  Service: Endoscopy;  Laterality: N/A;  . COLONOSCOPY N/A 11/03/2012   Procedure: COLONOSCOPY;  Surgeon: Cleotis Nipper, MD;  Location: [facility] ENDOSCOPY;  Service: Endoscopy;  Laterality: N/A;  . COLONOSCOPY N/A 09/16/2016   Procedure: COLONOSCOPY;  Surgeon: Jerene Bears, MD;  Location: [facility] ENDOSCOPY;  Service: Gastroenterology;  Laterality: N/A;  . ENTEROSCOPY N/A 11/08/2012   Procedure: ENTEROSCOPY;  Surgeon: Wonda Horner, MD;  Location: [facility] ENDOSCOPY;  Service: Endoscopy;  Laterality: N/A;  . ESOPHAGOGASTRODUODENOSCOPY N/A 11/02/2012   Procedure: ESOPHAGOGASTRODUODENOSCOPY (EGD);  Surgeon: Cleotis Nipper, MD;  Location: [facility] ENDOSCOPY;  Service: Endoscopy;  Laterality: N/A;  . ESOPHAGOGASTRODUODENOSCOPY (EGD) WITH PROPOFOL N/A 09/16/2016   Procedure: ESOPHAGOGASTRODUODENOSCOPY (EGD) WITH PROPOFOL;  Surgeon: Jerene Bears, MD;  Location: [facility] ENDOSCOPY;  Service: Gastroenterology;  Laterality: N/A;  . EXCHANGE OF A DIALYSIS CATHETER Left 11/13/2016   Procedure: EXCHANGE OF A DIALYSIS CATHETER;  Surgeon: Elam Dutch, MD;  Location: [facility] OR;  Service: Vascular;  Laterality: Left;  . EYE SURGERY Left    to remove scar tissue  . GIVENS CAPSULE STUDY N/A 11/04/2012   Procedure: GIVENS CAPSULE STUDY;  Surgeon: Cleotis Nipper, MD;  Location: [facility] ENDOSCOPY;  Service: Endoscopy;  Laterality: N/A;  . GIVENS CAPSULE STUDY N/A 09/29/2016   Procedure: GIVENS CAPSULE STUDY;  Surgeon: Manus Gunning, MD;  Location: [place];  Service: Gastroenterology;  Laterality: N/A;  try to keep pt up in bedside chair as much as possible during the study.    [name] HARDWARE REMOVAL Left 06/09/2013   Procedure: HARDWARE REMOVAL;  Surgeon: Newt Minion, MD;  Location: [place];  Service: Orthopedics;  Laterality: Left;  Left Below Knee Amputation   and Removal proximal screws IM tibial nail  . HEMATOMA EVACUATION Left 04/29/2017   Procedure: EVACUATION HEMATOMA LEFT ARM;  Surgeon: Conrad Chino, MD;  Location: [place];  Service: Vascular;  Laterality: Left;  . INSERTION OF DIALYSIS CATHETER N/A 11/09/2016   Procedure: INSERTION OF TEMPORARY DIALYSIS CATHETER;  Surgeon: Elam Dutch, MD;  Location: [place];  Service: Vascular;  Laterality: N/A;  . IR GENERIC HISTORICAL  09/27/2016   IR US GUIDE VASC ACCESS RIGHT 09/27/2016 Aletta Edouard, MD [facility]-INTER[facility]  . IR GENERIC HISTORICAL  09/27/2016   IR FLUORO GUIDE CV LINE RIGHT 09/27/2016 Aletta Edouard, MD [facility]  . LIGATION OF ARTERIOVENOUS  FISTULA Left 11/09/2016   Procedure: LIGATION OF ARTERIOVENOUS  FISTULA;  Surgeon: Elam Dutch, MD;  Location: [place];  Service: Vascular;  Laterality: Left;  . NEPHRECTOMY     partial RR  . ORIF FIBULA FRACTURE Left 09/09/2012   Procedure: OPEN REDUCTION INTERNAL FIXATION (ORIF) FIBULA FRACTURE;  Surgeon: Johnny Bridge, MD;  Location: [place];  Service: Orthopedics;  Laterality: Left;  . PACEMAKER IMPLANT N/A 04/30/2017   Procedure: PACEMAKER IMPLANT;  Surgeon: Evans Lance, MD;  Location: [place] CV LAB;  Service: Cardiovascular;  Laterality: N/A;  . TEMPORARY PACEMAKER N/A 04/29/2017   Procedure: TEMPORARY PACEMAKER;  Surgeon: Lorretta Harp, MD;  Location: [place] CV LAB;  Service: Cardiovascular;  Laterality: N/A;  . TIBIA IM NAIL INSERTION Left 09/09/2012   Procedure: INTRAMEDULLARY (IM) NAIL TIBIAL;  Surgeon: Johnny Bridge, MD;  Location: [place];  Service: Orthopedics;  Laterality: Left;  left tibial nail and open reduction internal fixation left fibula fracture  . UPPER EXTREMITY VENOGRAPHY N/A 12/14/2016   Procedure: Bilateral Upper Extremity Venography and Central Venography;  Surgeon: Conrad , MD;  Location: [place] CV LAB;  Service: Cardiovascular;  Laterality: N/A;     reports that he has never smoked. He has never  used smokeless tobacco. He reports that he does not drink alcohol or use drugs.  Allergies  Allergen Reactions  . No Known Allergies     Family History  Problem Relation Age of Onset  . Diabetes Mother   . Hypertension Mother   . Heart attack Father   . Hypertension Father   . Coronary artery disease Other     Prior to Admission medications   Medication Sig Start Date End Date Taking? Authorizing Provider  acetaminophen (TYLENOL) 500 MG tablet Take 500 mg by mouth every 6 (six) hours as needed for mild pain.   Yes [provider]  amiodarone (PACERONE) 200 MG tablet TAKE 1 TABLET BY MOUTH DAILY, START THIS ON 11/06/16 Patient taking differently: TAKE 200 mg TABLET BY MOUTH DAILY,  START THIS ON 11/06/16 11/02/16  Yes Croitoru, Mihai, MD  atorvastatin (LIPITOR) 40 MG tablet TAKE 1 TABLET(40 MG) BY MOUTH DAILY AT 6 PM 12/02/16  Yes Oscar Borg, MD  Bromfenac Sodium (PROLENSA) 0.07 % SOLN Place 1 drop into the left eye at bedtime.   Yes [provider]  calcium acetate (PHOSLO) 667 MG capsule Take 1,334 mg by mouth 3 (three) times daily with meals.    Yes [provider]  latanoprost (XALATAN) 0.005 % ophthalmic solution Place 1 drop into both eyes at bedtime.  06/12/16  Yes [provider]  oxyCODONE-acetaminophen (ROXICET) 5-325 MG tablet Take 1 tablet by mouth every 4 (four) hours as needed for moderate pain. 37/9/02  Yes Delora Fuel, MD  pantoprazole (PROTONIX) 40 MG tablet Take 1 tablet (40 mg total) by mouth 2 (two) times daily before a meal. 10/14/16  Yes Oscar Borg, MD  QUEtiapine (SEROQUEL) 25 MG tablet TAKE 1 TABLET(25 MG) BY MOUTH TWICE DAILY 01/05/17  Yes Oscar Borg, MD  SENSIPAR 30 MG tablet Take 30 mg by mouth every evening.  01/09/15  Yes [provider]  silver sulfADIAZINE (SILVADENE) 1 % cream Apply 1 application topically daily as needed (irratation). 03/01/17  Yes Suzan Slick, NP  traMADol (ULTRAM) 50 MG tablet Take 50 mg by  mouth every 6 (six) hours as needed for moderate pain.   Yes [provider]  warfarin (COUMADIN) 2.5 MG tablet Take 1 tablet daily except 1.5 tablets on Wed and Fri or as directed by Anticoagulation Clinic Patient taking differently: Take 2.5-3.75 mg by mouth See admin instructions. Take 2.5 mg  tablet daily except  Takes 3.75 mg on Friday 12/28/16  Yes Fay Records, MD  SSD 1 % cream APPLY EXTERNALLY TO THE AFFECTED AREA DAILY Patient not taking: Reported on 05/06/2017 04/27/17   Newt Minion, MD    Physical Exam:  Constitutional: Elderly male who appears to be chronically ill in moderate discomfort.  Vitals:   05/06/17 1728 05/06/17 2030 05/06/17 2100 05/06/17 2130  BP:  (!) 91/58 138/81 134/84  Pulse:  78 84 81  Resp:  16 16 17   Temp: 99.2 F (37.3 C)     TempSrc: Oral     SpO2:  98% 100% 100%   Eyes: PERRL, lids and conjunctivae normal ENMT: Mucous membranes are dry. Posterior pharynx clear of any exudate or lesions. Neck: normal, supple, no masses, no thyromegaly Respiratory: Decreased overall aeration with bibasilar crackles appreciated, no wheezing, no  Rhonchi. Normal respiratory effort. No accessory muscle use on room air.  Cardiovascular: Regular rate and rhythm, no murmurs / rubs / gallops. Left upper extremity edema swelling present but improved. 2+ pedal pulses. No carotid bruits.  Abdomen: no tenderness, no masses palpated. No hepatosplenomegaly. Bowel sounds positive.  Musculoskeletal: no clubbing / cyanosis. Bilateral BKA Skin: 2 stage one sacral ulcers present. Neurologic: CN 2-12 grossly intact. Sensation intact, DTR normal. Strength 5/5 in all 4.  Psychiatric: Normal judgment and insight. Alert and oriented x 3. Normal mood.     Labs on Admission: I have personally reviewed following labs and imaging studies  CBC:  Recent Labs Lab 04/30/17 0006 04/30/17 0550 05/01/17 0339 05/02/17 1153 05/06/17 1955 05/06/17 2000  WBC 5.4 7.6 6.5 5.9 7.2  --    NEUTROABS  --   --   --   --  4.8  --   HGB 8.5* 8.5* 7.6* 8.9* 7.3* 10.9*  HCT 26.4* 26.3*  24.7* 28.6* 23.8* 32.0*  MCV 85.7 84.8 86.4 88.0 89.5  --   PLT 242 231 208 229 287  --    Basic Metabolic Panel:  Recent Labs Lab 04/30/17 0006 04/30/17 0550 05/01/17 0339 05/06/17 1955 05/06/17 2000  NA 134* 133* 132* 133* 138  K 6.0* 5.7* 4.1 3.6 3.6  CL 99* 101 94* 98* 99*  CO2 18* 17* 24 25  --   GLUCOSE 168* 115* 133* 120* 122*  BUN 76* 75* 35* 24* 28*  CREATININE 10.71* 10.85* 6.92* 5.70* 5.80*  CALCIUM 9.0 9.0 8.0* 8.6*  --    GFR: Estimated Creatinine Clearance: 14.9 mL/min (A) (by C-G formula based on SCr of 5.8 mg/dL (H)). Liver Function Tests:  Recent Labs Lab 04/30/17 0006 05/06/17 1955  AST 25 19  ALT 15* <5*  ALKPHOS 53 56  BILITOT 0.6 0.7  PROT 6.9 7.4  ALBUMIN 3.0* 3.1*   No results for input(s): LIPASE, AMYLASE in the last 168 hours. No results for input(s): AMMONIA in the last 168 hours. Coagulation Profile:  Recent Labs Lab 04/30/17 0006 04/30/17 0550 05/01/17 0339 05/02/17 0515 05/06/17 1955  INR 2.47 2.59 2.25 1.79 1.68   Cardiac Enzymes:  Recent Labs Lab 04/30/17 0006 04/30/17 0550  TROPONINI 0.21* 0.29*   BNP (last 3 results) No results for input(s): PROBNP in the last 8760 hours. HbA1C: No results for input(s): HGBA1C in the last 72 hours. CBG:  Recent Labs Lab 05/01/17 1822 05/01/17 2237 05/02/17 0739 05/02/17 0827 05/02/17 1157  GLUCAP 98 159* 112* 116* 167*   Lipid Profile: No results for input(s): CHOL, HDL, LDLCALC, TRIG, CHOLHDL, LDLDIRECT in the last 72 hours. Thyroid Function Tests: No results for input(s): TSH, T4TOTAL, FREET4, T3FREE, THYROIDAB in the last 72 hours. Anemia Panel: No results for input(s): VITAMINB12, FOLATE, FERRITIN, TIBC, IRON, RETICCTPCT in the last 72 hours. Urine analysis:    Component Value Date/Time   COLORURINE YELLOW 05/15/2013 0007   APPEARANCEUR CLEAR 05/15/2013 0007   LABSPEC  1.014 05/15/2013 0007   PHURINE 8.5 (H) 05/15/2013 0007   GLUCOSEU 250 (A) 05/15/2013 0007   GLUCOSEU 500 (?) 11/06/2009 0948   HGBUR NEGATIVE 05/15/2013 0007   BILIRUBINUR NEGATIVE 05/15/2013 0007   KETONESUR NEGATIVE 05/15/2013 0007   PROTEINUR >300 (A) 05/15/2013 0007   UROBILINOGEN 0.2 05/15/2013 0007   NITRITE NEGATIVE 05/15/2013 0007   LEUKOCYTESUR NEGATIVE 05/15/2013 0007   Sepsis Labs: Recent Results (from the past 240 hour(s))  MRSA PCR Screening     Status: None   Collection Time: 04/29/17  8:01 PM  Result Value Ref Range Status   MRSA by PCR NEGATIVE NEGATIVE Final    Comment:        The GeneXpert MRSA Assay (FDA approved for NASAL specimens only), is one component of a comprehensive MRSA colonization surveillance program. It is not intended to diagnose MRSA infection nor to guide or monitor treatment for MRSA infections.   Surgical pcr screen     Status: None   Collection Time: 04/30/17  5:33 AM  Result Value Ref Range Status   MRSA, PCR NEGATIVE NEGATIVE Final   Staphylococcus aureus NEGATIVE NEGATIVE Final    Comment: (NOTE) The Xpert SA Assay (FDA approved for NASAL specimens in patients 11 years of age and older), is one component of a comprehensive surveillance program. It is not intended to diagnose infection nor to guide or monitor treatment.   Surgical PCR screen     Status: None   Collection Time:  04/30/17 10:09 AM  Result Value Ref Range Status   MRSA, PCR NEGATIVE NEGATIVE Final   Staphylococcus aureus NEGATIVE NEGATIVE Final    Comment: (NOTE) The Xpert SA Assay (FDA approved for NASAL specimens in patients 6 years of age and older), is one component of a comprehensive surveillance program. It is not intended to diagnose infection nor to guide or monitor treatment.      Radiological Exams on Admission: Dg Chest Port 1 View  Result Date: 05/06/2017 CLINICAL DATA:  Initial evaluation for acute shortness of breath. EXAM: PORTABLE CHEST 1  VIEW COMPARISON:  Prior radiograph from 05/01/2017. FINDINGS: Right-sided pacemaker/AICD in place. Left-sided dual lumen hemodialysis catheter in stable position. Stable cardiomegaly. Mediastinal silhouette normal. Lungs hypoinflated. Mild right basilar atelectasis and/ or scarring. No focal infiltrates. No pulmonary edema or definite pleural effusion. No pneumothorax. No acute osseus abnormality. Skin staples present at the medial left upper extremity, suspected to be related to AV fistula. IMPRESSION: 1. Shallow lung inflation with mild right basilar atelectasis and/or scarring. 2. No other active cardiopulmonary disease. 3. Stable cardiomegaly without pulmonary edema. Electronically Signed   By: Jeannine Boga M.D.   On: 05/06/2017 22:24   Ct Renal Stone Study  Result Date: 05/06/2017 CLINICAL DATA:  Initial evaluation for acute pain in sacral region. EXAM: CT ABDOMEN AND PELVIS WITHOUT CONTRAST TECHNIQUE: Multidetector CT imaging of the abdomen and pelvis was performed following the standard protocol without IV contrast. COMPARISON:  None available. FINDINGS: Lower chest: Bibasilar atelectasis and/ or scarring noted within the visualized lung bases. Cardiac pacemaker electrodes partially visualized. Visualized lungs otherwise clear. Hepatobiliary: Subcentimeter metallic density present within the inferior right hepatic lobe. Liver otherwise demonstrates a normal unenhanced appearance. Subtle hyperdensity within the gallbladder lumen suggestive of cholelithiasis. No imaging findings to suggest acute cholecystitis. No biliary dilatation. Pancreas: Pancreas within normal limits. Spleen: Spleen within normal limits. Adrenals/Urinary Tract: Adrenal glands are normal. Kidneys somewhat atrophic bilaterally. No nephrolithiasis or hydronephrosis. Subcentimeter hypodensity at the inferior pole left kidney too small the characterize, but statistically likely reflects a small cyst. No radiopaque calculi seen  along course of either renal collecting system. No hydroureter. Urinary bladder is displaced anteriorly by prominent stool within the rectal vault. Mild circumferential bladder wall thickening like related incomplete distension. No layering stones within the bladder lumen. Stomach/Bowel: Stomach within normal limits. No evidence for bowel obstruction. Large volume stool seen impacted within the rectal vault. Associated circumferential wall thickening. No other acute inflammatory changes seen about the bowels. Vascular/Lymphatic: Advanced aorto bi-iliac atherosclerotic disease. No aneurysm. No adenopathy. Reproductive: Prostate appears to be absent. Other: No free air or fluid. Musculoskeletal: No acute osseus abnormality. No worrisome lytic or blastic osseous lesions. Advanced degenerative spondylolysis at L5-S1. Prominent facet arthropathy present within the lower lumbar spine. Multiple prominent degenerative endplate Schmorl's nodes noted. IMPRESSION: 1. Large volume stool impacted within the distal colon/rectal vault, consistent with constipation. 2. No other acute intra-abdominal or pelvic process. 3. No CT evidence for nephrolithiasis or obstructive uropathy. 4. Cholelithiasis. 5. Advanced aorto bi-iliac atherosclerotic disease. Electronically Signed   By: Jeannine Boga M.D.   On: 05/06/2017 22:22    EKG: Independently reviewed. Normal sinus rhythm right axis deviation.  Assessment/Plan Sacral ulcerations with concern for sepsis: Acute. Patient presents with low-grade fever of 99.70F and lactic acid of 1.94.  He reports having severe pain in the sacrum area where he was found to have 2 stage I/II sacral ulcerations. It appears that due to the concern for  possible sepsis he was started on empiric antibiotics. The other differential given recent surgeries with pacemaker placement and evacuation of hematoma of the left arm would also be concern for a possible blood stream infection. - Admit to a  telemetry bed - Sepsis protocol initiated - Follow up blood cultures - Continue empiric antibiotics of vancomycin and Zosyn  - Low-air-loss mattress replacement  - Wound care consult  Lactic acidosis: Initial lactic acid elevated at 1.94 on admission. Question the possibility of underlying infection - Trend lactic acid levels  Fecal impaction and constipation: Patient seen on CT scan of the abdomen revealed large stool volume with distal impaction. Patient having loose stools around impaction. - SMOG enema and/or manual disimpaction - will need bowel regimen  Dyspnea: Acute. Patient never noted to be hypoxic and chest x-ray showing low lung volumes with possible atelectasis. Question if respiratory distress secondary to constipation. - Continuous pulse oximetry with nasal cannula oxygen as needed  - Incentive spirometry  ESRD on HD: Patient normally dialyzes M/W/F and had his normal dialysis session prior to being admitted today. He does not appear to show any acute signs being fluid overloaded - continue Phoslo and Sensipar - message left for nephrology to evaluate in a.m.  CHB s/p PM, PAF on chronic anticoagulation, subtherapeutic INR: Following patient recent hospitalization with pacemaker placement 10/5, it appears he was advised to restart on his anticoagulation. INR noted to be subtherapeutic at 1.64. - Continue amiodarone  - Coumadin per pharmacy  Diabetes Mellitus Type 2: Patient not on any medications for treatment of diabetes. Last Hbga1c 7.1 on 01/2017. - Hypoglycemic protocols - CBGs every before meals and at bedtime with sensitive SSI  GERD - Continue Protonix   Anemia of chronic disease: hemoglobin noted to be slightly lower than previous at 7.3. Stool guaiac negative. - recheck CBC in a.m  S/p bilateral amputation: patient bedbound  DVT prophylaxis: Warfarin Code Status: Full Family Communication:  discussed plan of care with patient and family present at bedside   Disposition Plan:  [place] discharged home in 1-2 days  Consults called: none  Admission status: observation  Norval Morton MD [facility] Pager [phone]   If 7PM-7AM, please contact night-coverage [email or web address] Password [email or web address]  05/06/2017, 11:12 PM

## 2017-05-06 NOTE — ED Provider Notes (Signed)
[facility] DEPT Provider Note   CSN: [id number] Arrival date & time: 05/06/17  1711     History   Chief Complaint Chief Complaint  Patient presents with  . Shortness of Breath    HPI Oscar Castillo is a 66 y.o. male.  66 year old male with complicated medical history including ESRD on HD TTS, A. fib on Coumadin, S/P bilateral BKA, CVA, diabetes, sacral ulcers, HTN, obesity who presents with generalized malaise, diffuse pain, and shortness of breath.  Patient states he returned home today after dialysis.  He had typical diffuse pain following his session.  He had substernal chest pain without radiation.  He is very somnolent on exam is a poor historian.  Patient spouse states she called EMS because patient was extremely dyspneic.  Patient currently states his symptoms have mostly resolved.  He is unable to get comfortable due to lower back pain and known sacral ulcers.  Of note, patient with recent admission for fistula hematoma. He apparently had cardiac arrest before ROSC. Pacemaker was placed. It appears patient was restarted on coumadin.  The history is provided by the patient and medical records. No language interpreter was used.    Past Medical History:  Diagnosis Date  . Anemia   . Antral ulcer 2014   small  . BENIGN PROSTATIC HYPERTROPHY   . Bipolar disorder ([place])    "sometimes" (10/07/2016)  . CHOLELITHIASIS   . Chronic combined systolic and diastolic CHF (congestive heart failure) ([place])   . Complication of anesthesia    wife states pt had trouble waking up with in Nov., 2014  . CVA (cerebral vascular accident) ([place]) 07/2007  . DEPRESSION   . DIABETES MELLITUS, TYPE II    diet control  . ERECTILE DYSFUNCTION   . ESRD on hemodialysis ([place])    ESRD due to DM/HTN. Started dialysis in November 2013.  HD TTS at [facility] on [address].  [name] GERD   . GI bleed    due to gastritis, discharged 10/02/16/notes 10/07/2016  . Hepatitis C    C - has been treated  . History  of Clostridium difficile   . History of kidney stones   . HYPERTENSION   . LBBB (left bundle branch block)   . Morbid obesity ([place])   . PAF (paroxysmal atrial fibrillation) ([place])    a. Dx 12/2015.    Patient Active Problem List   Diagnosis Date Noted  . Sacral ulcer ([place]) 05/07/2017  . CHB (complete heart block) ([place]) 04/29/2017  . Heart block AV third degree ([place]) 04/29/2017  . Complete heart block ([place])   . PAD (peripheral artery disease) ([place])   . General weakness 02/10/2017  . Gait disorder 02/10/2017  . MSSA bacteremia   . Sepsis ([place]) 11/08/2016  . Encounter for therapeutic drug monitoring 10/07/2016  . Diabetes mellitus with complication ([place])   . Anemia due to stage 5 chronic kidney disease ([place])   . Melena 09/26/2016  . Pressure injury of skin 09/26/2016  . Benign neoplasm of descending colon   . Gastroesophageal reflux disease with esophagitis   . Duodenal ulcer without hemorrhage or perforation   . Blood loss anemia 09/15/2016  . Paroxysmal atrial fibrillation ([place]) 09/15/2016  . Heme positive stool 09/15/2016  . RUQ pain 07/12/2016  . Hyperkalemia 07/01/2016  . Anemia 12/31/2015  . Glaucoma 12/27/2015  . LBBB (left bundle branch block)   . Rash 11/15/2015  . Cramping of hands 11/15/2015  . Liver fibrosis 10/07/2015  . Diarrhea  08/14/2014  . Dehydration 10/02/2013  . Fever 10/02/2013  . Altered mental status 10/01/2013  . Encephalopathy, toxic 10/01/2013  . Encephalopathy, metabolic 64/40/3474  . History of stroke 09/28/2013  . FTT (failure to thrive) in adult 09/28/2013  . Acute confusional state 09/28/2013  . Ulcer of sacral region, stage 3 ([place]) 09/26/2013  . S/P BKA (below knee amputation) bilateral ([place]) 09/08/2013  . ESRD on hemodialysis ([place]) 05/09/2013  . TIA (transient ischemic attack) 02/20/2013  . Acute blood loss anemia 10/28/2012  . Acute on chronic combined systolic and diastolic CHF (congestive heart failure) ([place]) 10/28/2012  .  Constipation 10/28/2012  . GI bleed 10/27/2012  . Mass in rectum 10/27/2012  . Type 2 diabetes mellitus with chronic kidney disease on chronic dialysis, with long-term current use of insulin ([place]) 09/08/2012  . ESRD (end stage renal disease) ([place]) 09/08/2012  . LVH (left ventricular hypertrophy)-severe concentric 06/13/2012  . Hyperlipidemia 01/30/2011  . CHOLELITHIASIS 08/01/2010  . BENIGN PROSTATIC HYPERTROPHY 08/01/2010  . CEREBROVASCULAR ACCIDENT, HX OF 08/06/2009  . Depression 03/18/2009  . NEPHROLITHIASIS, HX OF 03/18/2009  . Morbid obesity ([place]) 03/25/2007  . Hypertensive heart disease with CHF (congestive heart failure) ([place]) 03/25/2007  . GERD 03/25/2007  . Chronic hepatitis C without hepatic coma ([place]) 03/25/2007    Past Surgical History:  Procedure Laterality Date  . AMPUTATION Left 05/12/2013   Procedure: AMPUTATION RAY;  Surgeon: Newt Minion, MD;  Location: [facility];  Service: Orthopedics;  Laterality: Left;  Left Foot 1st Ray Amputation  . AMPUTATION Left 06/09/2013   Procedure: AMPUTATION BELOW KNEE;  Surgeon: Newt Minion, MD;  Location: [facility];  Service: Orthopedics;  Laterality: Left;  Left Below Knee Amputation and removal proximal screws IM tibial nail  . AMPUTATION Right 09/08/2013   Procedure: AMPUTATION BELOW KNEE;  Surgeon: Newt Minion, MD;  Location: [facility];  Service: Orthopedics;  Laterality: Right;  Right Below Knee Amputation  . AMPUTATION Right 10/11/2013   Procedure: AMPUTATION BELOW KNEE;  Surgeon: Newt Minion, MD;  Location: [facility];  Service: Orthopedics;  Laterality: Right;  Right Below Knee Amputation Revision  . AV FISTULA PLACEMENT  06/14/2012   Procedure: ARTERIOVENOUS (AV) FISTULA CREATION;  Surgeon: Angelia Mould, MD;  Location: [facility] OR;  Service: Vascular;  Laterality: Left;  Left basilic vein transposition with fistula.  [name] BASCILIC VEIN TRANSPOSITION Left 02/17/2017   Procedure: LEFT 1ST STAGE BRACHIAL VEIN TRANSPOSITION;  Surgeon: Conrad Green Park, MD;  Location: [facility];  Service: Vascular;  Laterality: Left;  . BASCILIC VEIN TRANSPOSITION Left 04/21/2017   Procedure: LEFT 2ND STAGE BRACHIAL VEIN TRANSPOSITION;  Surgeon: Conrad Allenport, MD;  Location: [facility];  Service: Vascular;  Laterality: Left;  . COLONOSCOPY N/A 10/28/2012   Procedure: COLONOSCOPY;  Surgeon: Jeryl Columbia, MD;  Location: [facility] ENDOSCOPY;  Service: Endoscopy;  Laterality: N/A;  . COLONOSCOPY N/A 11/02/2012   Procedure: COLONOSCOPY;  Surgeon: Cleotis Nipper, MD;  Location: [facility] ENDOSCOPY;  Service: Endoscopy;  Laterality: N/A;  . COLONOSCOPY N/A 11/03/2012   Procedure: COLONOSCOPY;  Surgeon: Cleotis Nipper, MD;  Location: [facility] ENDOSCOPY;  Service: Endoscopy;  Laterality: N/A;  . COLONOSCOPY N/A 09/16/2016   Procedure: COLONOSCOPY;  Surgeon: Jerene Bears, MD;  Location: [facility] ENDOSCOPY;  Service: Gastroenterology;  Laterality: N/A;  . ENTEROSCOPY N/A 11/08/2012   Procedure: ENTEROSCOPY;  Surgeon: Wonda Horner, MD;  Location: [facility] ENDOSCOPY;  Service: Endoscopy;  Laterality: N/A;  . ESOPHAGOGASTRODUODENOSCOPY N/A 11/02/2012   Procedure:  ESOPHAGOGASTRODUODENOSCOPY (EGD);  Surgeon: Cleotis Nipper, MD;  Location: [facility] ENDOSCOPY;  Service: Endoscopy;  Laterality: N/A;  . ESOPHAGOGASTRODUODENOSCOPY (EGD) WITH PROPOFOL N/A 09/16/2016   Procedure: ESOPHAGOGASTRODUODENOSCOPY (EGD) WITH PROPOFOL;  Surgeon: Jerene Bears, MD;  Location: [facility] ENDOSCOPY;  Service: Gastroenterology;  Laterality: N/A;  . EXCHANGE OF A DIALYSIS CATHETER Left 11/13/2016   Procedure: EXCHANGE OF A DIALYSIS CATHETER;  Surgeon: Elam Dutch, MD;  Location: [facility];  Service: Vascular;  Laterality: Left;  . EYE SURGERY Left    to remove scar tissue  . GIVENS CAPSULE STUDY N/A 11/04/2012   Procedure: GIVENS CAPSULE STUDY;  Surgeon: Cleotis Nipper, MD;  Location: [facility] ENDOSCOPY;  Service: Endoscopy;  Laterality: N/A;  . GIVENS CAPSULE STUDY N/A 09/29/2016   Procedure: GIVENS CAPSULE STUDY;  Surgeon: Manus Gunning,  MD;  Location: [facility];  Service: Gastroenterology;  Laterality: N/A;  try to keep pt up in bedside chair as much as possible during the study.    [name] HARDWARE REMOVAL Left 06/09/2013   Procedure: HARDWARE REMOVAL;  Surgeon: Newt Minion, MD;  Location: [facility];  Service: Orthopedics;  Laterality: Left;  Left Below Knee Amputation  and Removal proximal screws IM tibial nail  . HEMATOMA EVACUATION Left 04/29/2017   Procedure: EVACUATION HEMATOMA LEFT ARM;  Surgeon: Conrad Fort Drum, MD;  Location: [facility];  Service: Vascular;  Laterality: Left;  . INSERTION OF DIALYSIS CATHETER N/A 11/09/2016   Procedure: INSERTION OF TEMPORARY DIALYSIS CATHETER;  Surgeon: Elam Dutch, MD;  Location: [facility];  Service: Vascular;  Laterality: N/A;  . IR GENERIC HISTORICAL  09/27/2016   IR US GUIDE VASC ACCESS RIGHT 09/27/2016 Aletta Edouard, MD [facility]-INTER[facility] RAD  . IR GENERIC HISTORICAL  09/27/2016   IR FLUORO GUIDE CV LINE RIGHT 09/27/2016 Aletta Edouard, MD [facility]  . LIGATION OF ARTERIOVENOUS  FISTULA Left 11/09/2016   Procedure: LIGATION OF ARTERIOVENOUS  FISTULA;  Surgeon: Elam Dutch, MD;  Location: [facility];  Service: Vascular;  Laterality: Left;  . NEPHRECTOMY     partial RR  . ORIF FIBULA FRACTURE Left 09/09/2012   Procedure: OPEN REDUCTION INTERNAL FIXATION (ORIF) FIBULA FRACTURE;  Surgeon: Johnny Bridge, MD;  Location: [facility];  Service: Orthopedics;  Laterality: Left;  . PACEMAKER IMPLANT N/A 04/30/2017   Procedure: PACEMAKER IMPLANT;  Surgeon: Evans Lance, MD;  Location: [facility] CV LAB;  Service: Cardiovascular;  Laterality: N/A;  . TEMPORARY PACEMAKER N/A 04/29/2017   Procedure: TEMPORARY PACEMAKER;  Surgeon: Lorretta Harp, MD;  Location: [facility] CV LAB;  Service: Cardiovascular;  Laterality: N/A;  . TIBIA IM NAIL INSERTION Left 09/09/2012   Procedure: INTRAMEDULLARY (IM) NAIL TIBIAL;  Surgeon: Johnny Bridge, MD;  Location: [facility];  Service: Orthopedics;  Laterality: Left;  left tibial nail  and open reduction internal fixation left fibula fracture  . UPPER EXTREMITY VENOGRAPHY N/A 12/14/2016   Procedure: Bilateral Upper Extremity Venography and Central Venography;  Surgeon: Conrad Grandwood Park, MD;  Location: [facility] CV LAB;  Service: Cardiovascular;  Laterality: N/A;       Home Medications    Prior to Admission medications   Medication Sig Start Date End Date Taking? Authorizing Provider  acetaminophen (TYLENOL) 500 MG tablet Take 500 mg by mouth every 6 (six) hours as needed for mild pain.   Yes [provider]  amiodarone (PACERONE) 200 MG tablet TAKE 1 TABLET BY MOUTH DAILY, START THIS ON 11/06/16 Patient taking differently: TAKE 200 mg  TABLET BY MOUTH DAILY, START THIS ON 11/06/16 11/02/16  Yes Croitoru, Mihai, MD  atorvastatin (LIPITOR) 40 MG tablet TAKE 1 TABLET(40 MG) BY MOUTH DAILY AT 6 PM 12/02/16  Yes [name], MD  Bromfenac Sodium (PROLENSA) 0.07 % SOLN Place 1 drop into the left eye at bedtime.   Yes [provider]  calcium acetate (PHOSLO) 667 MG capsule Take 1,334 mg by mouth 3 (three) times daily with meals.    Yes [provider]  latanoprost (XALATAN) 0.005 % ophthalmic solution Place 1 drop into both eyes at bedtime.  06/12/16  Yes [provider]  oxyCODONE-acetaminophen (ROXICET) 5-325 MG tablet Take 1 tablet by mouth every 4 (four) hours as needed for moderate pain. 38/1/01  Yes Delora Fuel, MD  pantoprazole (PROTONIX) 40 MG tablet Take 1 tablet (40 mg total) by mouth 2 (two) times daily before a meal. 10/14/16  Yes [name], MD  QUEtiapine (SEROQUEL) 25 MG tablet TAKE 1 TABLET(25 MG) BY MOUTH TWICE DAILY 01/05/17  Yes [name], MD  SENSIPAR 30 MG tablet Take 30 mg by mouth every evening.  01/09/15  Yes [provider]  silver sulfADIAZINE (SILVADENE) 1 % cream Apply 1 application topically daily as needed (irratation). 03/01/17  Yes Suzan Slick, NP  traMADol (ULTRAM) 50 MG tablet Take 50 mg by mouth every  6 (six) hours as needed for moderate pain.   Yes [provider]  warfarin (COUMADIN) 2.5 MG tablet Take 1 tablet daily except 1.5 tablets on Wed and Fri or as directed by Anticoagulation Clinic Patient taking differently: Take 2.5-3.75 mg by mouth See admin instructions. Take 2.5 mg  tablet daily except  Takes 3.75 mg on Friday 12/28/16  Yes Fay Records, MD  SSD 1 % cream APPLY EXTERNALLY TO THE AFFECTED AREA DAILY Patient not taking: Reported on 05/06/2017 04/27/17   Newt Minion, MD    Family History Family History  Problem Relation Age of Onset  . Diabetes Mother   . Hypertension Mother   . Heart attack Father   . Hypertension Father   . Coronary artery disease Other     Social History Social History  Substance Use Topics  . Smoking status: Never Smoker  . Smokeless tobacco: Never Used  . Alcohol use No     Allergies   No known allergies   Review of Systems Review of Systems  Constitutional: Positive for chills and fatigue. Negative for fever.  HENT: Negative for ear pain and sore throat.   Eyes: Negative for pain and visual disturbance.  Respiratory: Positive for shortness of breath. Negative for cough.   Cardiovascular: Positive for chest pain. Negative for palpitations.  Gastrointestinal: Positive for abdominal pain. Negative for vomiting.  Genitourinary: Negative for dysuria and hematuria.  Musculoskeletal: Negative for arthralgias and back pain.  Skin: Positive for wound (Sacral ulcers). Negative for color change and rash.  Neurological: Negative for seizures and syncope.  All other systems reviewed and are negative.    Physical Exam Updated Vital Signs BP (!) 128/95 (BP Location: Right Wrist)   Pulse 88   Temp 99.2 F (37.3 C) (Oral)   Resp (!) 9   Ht 5\' 3"  (1.6 m)   Wt 88.7 kg (195 lb 9.6 oz)   SpO2 100%   BMI 34.65 kg/m   Physical Exam  Constitutional: He appears well-developed. He appears listless.  HENT:  Head: Normocephalic and  atraumatic.  Eyes: Conjunctivae are normal.  Neck: Neck  supple.  Cardiovascular: Normal rate and regular rhythm.   No murmur heard. Pulmonary/Chest: Effort normal and breath sounds normal. No respiratory distress.  Abdominal: Soft. There is no tenderness.  Musculoskeletal: He exhibits no edema.  Foul smelling and purulence from sacral decubitus ulcers. Bilateral BKA c/d/i. LUE fistula c/d/i  Neurological: He appears listless. No cranial nerve deficit. Coordination normal.  Somnolent on exam though arousable and answering questions  Skin: Skin is warm and dry.  Nursing note and vitals reviewed.    ED Treatments / Results  Labs (all labs ordered are listed, but only abnormal results are displayed) Labs Reviewed  CBC WITH DIFFERENTIAL/PLATELET - Abnormal; Notable for the following:       Result Value   RBC 2.66 (*)    Hemoglobin 7.3 (*)    HCT 23.8 (*)    RDW 18.1 (*)    All other components within normal limits  COMPREHENSIVE METABOLIC PANEL - Abnormal; Notable for the following:    Sodium 133 (*)    Chloride 98 (*)    Glucose, Bld 120 (*)    BUN 24 (*)    Creatinine, Ser 5.70 (*)    Calcium 8.6 (*)    Albumin 3.1 (*)    ALT <5 (*)    GFR calc non Af Amer 9 (*)    GFR calc Af Amer 11 (*)    All other components within normal limits  PROTIME-INR - Abnormal; Notable for the following:    Prothrombin Time 19.7 (*)    All other components within normal limits  I-STAT CHEM 8, ED - Abnormal; Notable for the following:    Chloride 99 (*)    BUN 28 (*)    Creatinine, Ser 5.80 (*)    Glucose, Bld 122 (*)    Calcium, Ion 1.04 (*)    Hemoglobin 10.9 (*)    HCT 32.0 (*)    All other components within normal limits  I-STAT CG4 LACTIC ACID, ED - Abnormal; Notable for the following:    Lactic Acid, Venous 1.94 (*)    All other components within normal limits  CULTURE, BLOOD (ROUTINE X 2)  CULTURE, BLOOD (ROUTINE X 2)  PROTIME-INR  CBC WITH DIFFERENTIAL/PLATELET  BASIC  METABOLIC PANEL  I-STAT TROPONIN, ED  I-STAT CG4 LACTIC ACID, ED  POC OCCULT BLOOD, ED    EKG  EKG Interpretation None       Radiology Dg Chest Port 1 View  Result Date: 05/06/2017 CLINICAL DATA:  Initial evaluation for acute shortness of breath. EXAM: PORTABLE CHEST 1 VIEW COMPARISON:  Prior radiograph from 05/01/2017. FINDINGS: Right-sided pacemaker/AICD in place. Left-sided dual lumen hemodialysis catheter in stable position. Stable cardiomegaly. Mediastinal silhouette normal. Lungs hypoinflated. Mild right basilar atelectasis and/ or scarring. No focal infiltrates. No pulmonary edema or definite pleural effusion. No pneumothorax. No acute osseus abnormality. Skin staples present at the medial left upper extremity, suspected to be related to AV fistula. IMPRESSION: 1. Shallow lung inflation with mild right basilar atelectasis and/or scarring. 2. No other active cardiopulmonary disease. 3. Stable cardiomegaly without pulmonary edema. Electronically Signed   By: Jeannine Boga M.D.   On: 05/06/2017 22:24   Ct Renal Stone Study  Result Date: 05/06/2017 CLINICAL DATA:  Initial evaluation for acute pain in sacral region. EXAM: CT ABDOMEN AND PELVIS WITHOUT CONTRAST TECHNIQUE: Multidetector CT imaging of the abdomen and pelvis was performed following the standard protocol without IV contrast. COMPARISON:  None available. FINDINGS: Lower chest: Bibasilar atelectasis and/ or scarring  noted within the visualized lung bases. Cardiac pacemaker electrodes partially visualized. Visualized lungs otherwise clear. Hepatobiliary: Subcentimeter metallic density present within the inferior right hepatic lobe. Liver otherwise demonstrates a normal unenhanced appearance. Subtle hyperdensity within the gallbladder lumen suggestive of cholelithiasis. No imaging findings to suggest acute cholecystitis. No biliary dilatation. Pancreas: Pancreas within normal limits. Spleen: Spleen within normal limits.  Adrenals/Urinary Tract: Adrenal glands are normal. Kidneys somewhat atrophic bilaterally. No nephrolithiasis or hydronephrosis. Subcentimeter hypodensity at the inferior pole left kidney too small the characterize, but statistically likely reflects a small cyst. No radiopaque calculi seen along course of either renal collecting system. No hydroureter. Urinary bladder is displaced anteriorly by prominent stool within the rectal vault. Mild circumferential bladder wall thickening like related incomplete distension. No layering stones within the bladder lumen. Stomach/Bowel: Stomach within normal limits. No evidence for bowel obstruction. Large volume stool seen impacted within the rectal vault. Associated circumferential wall thickening. No other acute inflammatory changes seen about the bowels. Vascular/Lymphatic: Advanced aorto bi-iliac atherosclerotic disease. No aneurysm. No adenopathy. Reproductive: Prostate appears to be absent. Other: No free air or fluid. Musculoskeletal: No acute osseus abnormality. No worrisome lytic or blastic osseous lesions. Advanced degenerative spondylolysis at L5-S1. Prominent facet arthropathy present within the lower lumbar spine. Multiple prominent degenerative endplate Schmorl's nodes noted. IMPRESSION: 1. Large volume stool impacted within the distal colon/rectal vault, consistent with constipation. 2. No other acute intra-abdominal or pelvic process. 3. No CT evidence for nephrolithiasis or obstructive uropathy. 4. Cholelithiasis. 5. Advanced aorto bi-iliac atherosclerotic disease. Electronically Signed   By: Jeannine Boga M.D.   On: 05/06/2017 22:22    Procedures Procedures (including critical care time)  Medications Ordered in ED Medications  vancomycin (VANCOCIN) IVPB 1000 mg/200 mL premix (not administered)  piperacillin-tazobactam (ZOSYN) IVPB 3.375 g (0 g Intravenous Stopped 05/07/17 0311)  oxyCODONE-acetaminophen (PERCOCET/ROXICET) 5-325 MG per tablet 1  tablet (not administered)  QUEtiapine (SEROQUEL) tablet 25 mg (25 mg Oral Given 05/07/17 0141)  latanoprost (XALATAN) 0.005 % ophthalmic solution 1 drop (1 drop Both Eyes Not Given 05/07/17 0142)  pantoprazole (PROTONIX) EC tablet 40 mg (not administered)  calcium acetate (PHOSLO) capsule 1,334 mg (not administered)  cinacalcet (SENSIPAR) tablet 30 mg (not administered)  amiodarone (PACERONE) tablet 200 mg (not administered)  ketorolac (ACULAR) 0.5 % ophthalmic solution 1 drop (1 drop Left Eye Not Given 05/07/17 0141)  ondansetron (ZOFRAN) tablet 4 mg (not administered)    Or  ondansetron (ZOFRAN) injection 4 mg (not administered)  acetaminophen (TYLENOL) tablet 650 mg (not administered)    Or  acetaminophen (TYLENOL) suppository 650 mg (not administered)  ipratropium-albuterol (DUONEB) 0.5-2.5 (3) MG/3ML nebulizer solution 3 mL (not administered)  Warfarin - Pharmacist Dosing Inpatient (not administered)  sorbitol, milk of mag, mineral oil, glycerin (SMOG) enema (960 mLs Rectal Not Given 05/07/17 0215)  morphine 4 MG/ML injection 2 mg (not administered)  morphine 4 MG/ML injection 4 mg (4 mg Intravenous Given 05/06/17 2005)  vancomycin (VANCOCIN) 2,000 mg in sodium chloride 0.9 % 500 mL IVPB (0 mg Intravenous Stopped 05/07/17 0040)  warfarin (COUMADIN) tablet 4 mg (4 mg Oral Given 05/07/17 0141)  morphine 4 MG/ML injection (  Duplicate 16/10/96 0454)     Initial Impression / Assessment and Plan / ED Course  I have reviewed the triage vital signs and the nursing notes.  Pertinent labs & imaging results that were available during my care of the patient were reviewed by me and considered in my medical decision making (see chart for  details).     66 year old with complicated medical history and recent admission notable for cardiac arrest who presents with generalized malaise, dyspnea, and diffuse pain.  Symptoms mostly resolved, though appears uncomfortable and somnolent.  Purulent,  foul-smelling sacral ulcers noted on turning patient.  Afebrile, VSS.  Lactate 1.94.  CBC notable for Hgb 7.3, decreased from 8.94 days ago.  CT renal study showing large volume stool consistent with constipation.  Concern for infection of sacral decubitus ulcers.  Vanc/Zosyn given.  Patient admitted for further management and evaluation.  Pt care d/w Dr. Darl Householder  Final Clinical Impressions(s) / ED Diagnoses   Final diagnoses:  Skin ulcer of sacrum, unspecified ulcer stage ([place])  Infected decubitus ulcer, unspecified ulcer stage    New Prescriptions Current Discharge Medication List       Payton Emerald, MD 05/07/17 0404    Drenda Freeze, MD 05/10/17 1432

## 2017-05-06 NOTE — Progress Notes (Signed)
Pharmacy Antibiotic Note  Oscar Castillo is a 66 y.o. male admitted on 05/06/2017 with wound infection/ cellulitis. Spoke with EDP and patient has sacral breakdown- recently in hospital for pacemaker replacement.  Pharmacy has been consulted for vancomycin and zosyn dosing. ESRD HD T, Th, Sat. WBC wnl, LA 1.94, Afebrile.   Plan: Vancomycin 2000mg  IV X1, then vancomycin 1000mg  IV with each HD session T, Th, Sat. Zosyn 3.375g IV every 12 hours (4 hour infusion) Pre-HD  Vancomycin level as indicated, monitor for c/s, clinical resolution,  F/u de-escalation plan/LOT, F/u inpatient HD schedule/tolerance for entering maintenance doses    Temp (24hrs), Avg:99.2 F (37.3 C), Min:99.2 F (37.3 C), Max:99.2 F (37.3 C)   Recent Labs Lab 04/30/17 0006 04/30/17 0550 05/01/17 0339 05/02/17 1153 05/06/17 1955 05/06/17 2000 05/06/17 2100  WBC 5.4 7.6 6.5 5.9 7.2  --   --   CREATININE 10.71* 10.85* 6.92*  --  5.70* 5.80*  --   LATICACIDVEN  --   --   --   --   --   --  1.94*    Estimated Creatinine Clearance: 14.9 mL/min (A) (by C-G formula based on SCr of 5.8 mg/dL (H)).    Allergies  Allergen Reactions  . No Known Allergies    Microbiology results: 10/11 BCx:   Thank you for allowing pharmacy to be a part of this patient's care.  Jerrye Noble, PharmD Candidate 05/06/2017 9:41 PM

## 2017-05-06 NOTE — ED Notes (Signed)
IV attempt x1 right wrist

## 2017-05-07 ENCOUNTER — Encounter (HOSPITAL_COMMUNITY): Payer: Self-pay

## 2017-05-07 ENCOUNTER — Encounter: Payer: Medicare Other | Admitting: Vascular Surgery

## 2017-05-07 DIAGNOSIS — N186 End stage renal disease: Secondary | ICD-10-CM | POA: Diagnosis present

## 2017-05-07 DIAGNOSIS — E1122 Type 2 diabetes mellitus with diabetic chronic kidney disease: Secondary | ICD-10-CM | POA: Diagnosis not present

## 2017-05-07 DIAGNOSIS — E889 Metabolic disorder, unspecified: Secondary | ICD-10-CM | POA: Diagnosis present

## 2017-05-07 DIAGNOSIS — N2581 Secondary hyperparathyroidism of renal origin: Secondary | ICD-10-CM | POA: Diagnosis not present

## 2017-05-07 DIAGNOSIS — R451 Restlessness and agitation: Secondary | ICD-10-CM | POA: Diagnosis present

## 2017-05-07 DIAGNOSIS — A419 Sepsis, unspecified organism: Secondary | ICD-10-CM | POA: Diagnosis present

## 2017-05-07 DIAGNOSIS — Z992 Dependence on renal dialysis: Secondary | ICD-10-CM | POA: Diagnosis not present

## 2017-05-07 DIAGNOSIS — J9811 Atelectasis: Secondary | ICD-10-CM | POA: Diagnosis present

## 2017-05-07 DIAGNOSIS — E1140 Type 2 diabetes mellitus with diabetic neuropathy, unspecified: Secondary | ICD-10-CM | POA: Diagnosis present

## 2017-05-07 DIAGNOSIS — L98429 Non-pressure chronic ulcer of back with unspecified severity: Secondary | ICD-10-CM | POA: Diagnosis not present

## 2017-05-07 DIAGNOSIS — E1121 Type 2 diabetes mellitus with diabetic nephropathy: Secondary | ICD-10-CM | POA: Diagnosis present

## 2017-05-07 DIAGNOSIS — K219 Gastro-esophageal reflux disease without esophagitis: Secondary | ICD-10-CM | POA: Diagnosis present

## 2017-05-07 DIAGNOSIS — I442 Atrioventricular block, complete: Secondary | ICD-10-CM | POA: Diagnosis present

## 2017-05-07 DIAGNOSIS — K5641 Fecal impaction: Secondary | ICD-10-CM | POA: Diagnosis present

## 2017-05-07 DIAGNOSIS — Y92230 Patient room in hospital as the place of occurrence of the external cause: Secondary | ICD-10-CM | POA: Diagnosis not present

## 2017-05-07 DIAGNOSIS — Z8674 Personal history of sudden cardiac arrest: Secondary | ICD-10-CM | POA: Diagnosis not present

## 2017-05-07 DIAGNOSIS — I480 Paroxysmal atrial fibrillation: Secondary | ICD-10-CM | POA: Diagnosis present

## 2017-05-07 DIAGNOSIS — N400 Enlarged prostate without lower urinary tract symptoms: Secondary | ICD-10-CM | POA: Diagnosis present

## 2017-05-07 DIAGNOSIS — R791 Abnormal coagulation profile: Secondary | ICD-10-CM | POA: Diagnosis present

## 2017-05-07 DIAGNOSIS — I482 Chronic atrial fibrillation: Secondary | ICD-10-CM | POA: Diagnosis present

## 2017-05-07 DIAGNOSIS — F319 Bipolar disorder, unspecified: Secondary | ICD-10-CM | POA: Diagnosis present

## 2017-05-07 DIAGNOSIS — I132 Hypertensive heart and chronic kidney disease with heart failure and with stage 5 chronic kidney disease, or end stage renal disease: Secondary | ICD-10-CM | POA: Diagnosis present

## 2017-05-07 DIAGNOSIS — I5042 Chronic combined systolic (congestive) and diastolic (congestive) heart failure: Secondary | ICD-10-CM | POA: Diagnosis present

## 2017-05-07 DIAGNOSIS — D631 Anemia in chronic kidney disease: Secondary | ICD-10-CM | POA: Diagnosis present

## 2017-05-07 DIAGNOSIS — E11622 Type 2 diabetes mellitus with other skin ulcer: Secondary | ICD-10-CM | POA: Diagnosis present

## 2017-05-07 DIAGNOSIS — L89159 Pressure ulcer of sacral region, unspecified stage: Secondary | ICD-10-CM | POA: Diagnosis present

## 2017-05-07 LAB — PROTIME-INR
INR: 1.93
PROTHROMBIN TIME: 21.9 s — AB (ref 11.4–15.2)

## 2017-05-07 LAB — CBC WITH DIFFERENTIAL/PLATELET
BASOS PCT: 0 %
Basophils Absolute: 0 10*3/uL (ref 0.0–0.1)
EOS ABS: 0.1 10*3/uL (ref 0.0–0.7)
EOS PCT: 1 %
HCT: 29.7 % — ABNORMAL LOW (ref 39.0–52.0)
Hemoglobin: 9.2 g/dL — ABNORMAL LOW (ref 13.0–17.0)
LYMPHS ABS: 0.8 10*3/uL (ref 0.7–4.0)
Lymphocytes Relative: 8 %
MCH: 27.6 pg (ref 26.0–34.0)
MCHC: 31 g/dL (ref 30.0–36.0)
MCV: 89.2 fL (ref 78.0–100.0)
MONO ABS: 1.2 10*3/uL — AB (ref 0.1–1.0)
Monocytes Relative: 12 %
NEUTROS PCT: 79 %
Neutro Abs: 7.5 10*3/uL (ref 1.7–7.7)
Platelets: 218 10*3/uL (ref 150–400)
RBC: 3.33 MIL/uL — AB (ref 4.22–5.81)
RDW: 18.2 % — ABNORMAL HIGH (ref 11.5–15.5)
WBC: 9.6 10*3/uL (ref 4.0–10.5)

## 2017-05-07 LAB — GLUCOSE, CAPILLARY
GLUCOSE-CAPILLARY: 86 mg/dL (ref 65–99)
Glucose-Capillary: 160 mg/dL — ABNORMAL HIGH (ref 65–99)
Glucose-Capillary: 183 mg/dL — ABNORMAL HIGH (ref 65–99)

## 2017-05-07 LAB — BLOOD CULTURE ID PANEL (REFLEXED)
ACINETOBACTER BAUMANNII: NOT DETECTED
CANDIDA KRUSEI: NOT DETECTED
CANDIDA TROPICALIS: NOT DETECTED
Candida albicans: NOT DETECTED
Candida glabrata: NOT DETECTED
Candida parapsilosis: NOT DETECTED
ENTEROBACTER CLOACAE COMPLEX: NOT DETECTED
ESCHERICHIA COLI: NOT DETECTED
Enterobacteriaceae species: NOT DETECTED
Enterococcus species: NOT DETECTED
HAEMOPHILUS INFLUENZAE: NOT DETECTED
KLEBSIELLA OXYTOCA: NOT DETECTED
KLEBSIELLA PNEUMONIAE: NOT DETECTED
Listeria monocytogenes: NOT DETECTED
Neisseria meningitidis: NOT DETECTED
Proteus species: NOT DETECTED
Pseudomonas aeruginosa: NOT DETECTED
SERRATIA MARCESCENS: NOT DETECTED
STAPHYLOCOCCUS AUREUS BCID: NOT DETECTED
STREPTOCOCCUS SPECIES: NOT DETECTED
Staphylococcus species: NOT DETECTED
Streptococcus agalactiae: NOT DETECTED
Streptococcus pneumoniae: NOT DETECTED
Streptococcus pyogenes: NOT DETECTED

## 2017-05-07 LAB — BASIC METABOLIC PANEL
ANION GAP: 14 (ref 5–15)
BUN: 28 mg/dL — ABNORMAL HIGH (ref 6–20)
CO2: 22 mmol/L (ref 22–32)
Calcium: 8.5 mg/dL — ABNORMAL LOW (ref 8.9–10.3)
Chloride: 101 mmol/L (ref 101–111)
Creatinine, Ser: 6.25 mg/dL — ABNORMAL HIGH (ref 0.61–1.24)
GFR calc Af Amer: 10 mL/min — ABNORMAL LOW (ref >60)
GFR calc non Af Amer: 8 mL/min — ABNORMAL LOW (ref >60)
GLUCOSE: 115 mg/dL — AB (ref 65–99)
POTASSIUM: 4 mmol/L (ref 3.5–5.1)
Sodium: 137 mmol/L (ref 135–145)

## 2017-05-07 MED ORDER — ONDANSETRON HCL 4 MG/2ML IJ SOLN: 4.0000 mg | Freq: Four times a day (QID) | INTRAMUSCULAR | Status: DC | PRN

## 2017-05-07 MED ORDER — PANTOPRAZOLE SODIUM 40 MG PO TBEC
40.0000 mg | DELAYED_RELEASE_TABLET | Freq: Two times a day (BID) | ORAL | Status: DC
  Administered 2017-05-07 – 2017-05-09 (×4): 40 mg via ORAL
  Filled 2017-05-07 (×4): qty 1

## 2017-05-07 MED ORDER — ONDANSETRON HCL 4 MG PO TABS: 4.0000 mg | ORAL_TABLET | Freq: Four times a day (QID) | ORAL | Status: DC | PRN

## 2017-05-07 MED ORDER — LATANOPROST 0.005 % OP SOLN
1.0000 [drp] | Freq: Every day | OPHTHALMIC | Status: DC
  Administered 2017-05-08: 1 [drp] via OPHTHALMIC
  Filled 2017-05-07: qty 2.5

## 2017-05-07 MED ORDER — INSULIN ASPART 100 UNIT/ML ~~LOC~~ SOLN
0.0000 [IU] | Freq: Three times a day (TID) | SUBCUTANEOUS | Status: DC
  Administered 2017-05-07 – 2017-05-08 (×2): 2 [IU] via SUBCUTANEOUS

## 2017-05-07 MED ORDER — WARFARIN - PHARMACIST DOSING INPATIENT
Freq: Every day | Status: DC
  Administered 2017-05-07 – 2017-05-08 (×2)

## 2017-05-07 MED ORDER — KETOROLAC TROMETHAMINE 0.5 % OP SOLN
1.0000 [drp] | Freq: Every day | OPHTHALMIC | Status: DC
Start: 2017-05-07 — End: 2017-05-09
  Administered 2017-05-08: 1 [drp] via OPHTHALMIC
  Filled 2017-05-07: qty 3

## 2017-05-07 MED ORDER — INSULIN ASPART 100 UNIT/ML ~~LOC~~ SOLN: 0.0000 [IU] | Freq: Every day | SUBCUTANEOUS | Status: DC

## 2017-05-07 MED ORDER — WARFARIN SODIUM 4 MG PO TABS
4.0000 mg | ORAL_TABLET | Freq: Once | ORAL | Status: AC
  Administered 2017-05-07: 4 mg via ORAL
  Filled 2017-05-07 (×2): qty 1

## 2017-05-07 MED ORDER — MORPHINE SULFATE (PF) 4 MG/ML IV SOLN
2.0000 mg | INTRAVENOUS | Status: DC | PRN
  Administered 2017-05-07: 2 mg via INTRAVENOUS

## 2017-05-07 MED ORDER — OXYCODONE-ACETAMINOPHEN 5-325 MG PO TABS
1.0000 | ORAL_TABLET | ORAL | Status: DC | PRN
  Administered 2017-05-07 – 2017-05-08 (×5): 1 via ORAL
  Filled 2017-05-07 (×4): qty 1

## 2017-05-07 MED ORDER — MORPHINE SULFATE (PF) 4 MG/ML IV SOLN: 2.0000 mg | INTRAVENOUS | Status: DC | PRN

## 2017-05-07 MED ORDER — QUETIAPINE FUMARATE 25 MG PO TABS
25.0000 mg | ORAL_TABLET | Freq: Two times a day (BID) | ORAL | Status: DC
  Administered 2017-05-07 – 2017-05-09 (×4): 25 mg via ORAL
  Filled 2017-05-07 (×5): qty 1

## 2017-05-07 MED ORDER — ACETAMINOPHEN 325 MG PO TABS
650.0000 mg | ORAL_TABLET | Freq: Four times a day (QID) | ORAL | Status: DC | PRN
  Administered 2017-05-07 – 2017-05-09 (×2): 650 mg via ORAL
  Filled 2017-05-07 (×3): qty 2

## 2017-05-07 MED ORDER — SORBITOL 70 % SOLN
960.0000 mL | TOPICAL_OIL | Freq: Once | ORAL | Status: DC
  Filled 2017-05-07: qty 473

## 2017-05-07 MED ORDER — ACETAMINOPHEN 650 MG RE SUPP: 650.0000 mg | Freq: Four times a day (QID) | RECTAL | Status: DC | PRN

## 2017-05-07 MED ORDER — CINACALCET HCL 30 MG PO TABS
30.0000 mg | ORAL_TABLET | Freq: Every evening | ORAL | Status: DC
  Administered 2017-05-07 – 2017-05-08 (×2): 30 mg via ORAL
  Filled 2017-05-07 (×2): qty 1

## 2017-05-07 MED ORDER — WARFARIN SODIUM 4 MG PO TABS
4.0000 mg | ORAL_TABLET | Freq: Once | ORAL | Status: AC
  Administered 2017-05-07: 4 mg via ORAL
  Filled 2017-05-07: qty 1

## 2017-05-07 MED ORDER — ATORVASTATIN CALCIUM 40 MG PO TABS
40.0000 mg | ORAL_TABLET | Freq: Every day | ORAL | Status: DC
  Administered 2017-05-07 – 2017-05-08 (×2): 40 mg via ORAL
  Filled 2017-05-07 (×2): qty 1

## 2017-05-07 MED ORDER — IPRATROPIUM-ALBUTEROL 0.5-2.5 (3) MG/3ML IN SOLN: 3.0000 mL | RESPIRATORY_TRACT | Status: DC | PRN

## 2017-05-07 MED ORDER — AMIODARONE HCL 200 MG PO TABS
200.0000 mg | ORAL_TABLET | Freq: Every day | ORAL | Status: DC
  Administered 2017-05-07 – 2017-05-09 (×3): 200 mg via ORAL
  Filled 2017-05-07 (×3): qty 1

## 2017-05-07 MED ORDER — CALCIUM ACETATE (PHOS BINDER) 667 MG PO CAPS
1334.0000 mg | ORAL_CAPSULE | Freq: Three times a day (TID) | ORAL | Status: DC
  Administered 2017-05-07 – 2017-05-09 (×4): 1334 mg via ORAL
  Filled 2017-05-07 (×4): qty 2

## 2017-05-07 MED ORDER — SILVER SULFADIAZINE 1 % EX CREA: 1.0000 "application " | TOPICAL_CREAM | Freq: Every day | CUTANEOUS | Status: DC | PRN

## 2017-05-07 MED ORDER — SODIUM CHLORIDE 0.9 % IV BOLUS (SEPSIS)
250.0000 mL | Freq: Once | INTRAVENOUS | Status: AC
  Administered 2017-05-07: 250 mL via INTRAVENOUS

## 2017-05-07 MED ORDER — MORPHINE SULFATE (PF) 4 MG/ML IV SOLN
INTRAVENOUS | Status: AC
  Filled 2017-05-07: qty 1

## 2017-05-07 NOTE — Progress Notes (Signed)
PROGRESS NOTE    Oscar Castillo  NGE:[id number] DOB: 03/10/1951 DOA: 05/06/2017 PCP: [name], MD   Specialists:     Brief Narrative:   66 year old male  ESRD TTS-[facility] Diastolic heart failure EF 55-60% Atrial fibrillation chronic, [place] score >3 on warfarin Diabetes mellitus with nephropathy and neuropathy and bilateral BKA   Admitted 04/29/2017 with third degree AV block by critical care--which developed perioperatively following revision of left arm AV fistula--temporary pacing wire placed at that time and patient was on ventilator  Appears to have been sent home on 10/7 return to emergency room 10/11 with agitation pain and discomfort  Patient had blood pressures in the 90s, low-grade temperatures, new sacral ulcers and was started on empiric coverage for sepsis presumed from decubiti--placed on vancomycin and Zosyn  Also found to be impacted did abdominal CT scan   Differential diagnosis for fever workup includes a recent pacemaker placement, evacuation of left arm in a coma, sacral decubiti  Wound nurse has been consulted      Assessment & Plan:   Principal Problem:   Sacral ulcer ([place]) Active Problems:   Type 2 diabetes mellitus with chronic kidney disease on chronic dialysis, with long-term current use of insulin (HCC)   Constipation   ESRD on hemodialysis (HCC)   Paroxysmal atrial fibrillation (HCC)   Heart block AV third degree (HCC)   Sepsis  Etiology not entirely clear  Continue Empiric vancomycin/Zosyn for now   Low loss air mattress, silver sulfadiazine to sacral decubiti   May need vascular input --  Still has indwelling left subclavian dialysis catheter--could be nidus for infection--asking Vascular  to see regarding the same   Will remove TDC after next dialysis and coordinate ? Femoral access--will need 48 hours line  holiday prior to replacement of TDC and cultures to signify clearance of bacteremia      ESRD Metabolic bone disease  anemia of  renal disease  Continue PhosLo 1.3 g 3 times a day  Nephrology aware of need for regular dialysis and has been consulted  Third degree heart block with [employer] pacemaker placed 05/01/17 Atrial fibrillation [place] score >3 on Coumadin, amiodarone  Monitor on telemetry and stepdown unit for now  Adding magnesium 2 AM labs  Recheck INR as was subtherapeutic on admission at 1.6  Diabetes mellitus with nephropathy, neuropathy, BKA bilaterally  Not on meds, last A1c 7  Continue sliding scale coverage   Anticoagulated on Coumadin--- might switch to heparin dependent on need for procedures Full CODE STATUS Inpatient on step down No family + right now   Consultants:   Vascular surgery  Procedures:     Antimicrobials:   Vancomycin/Zosyn 10/11    Subjective:  " I wanna go home"  Eating and drinking Awake alert oriented  Objective: Vitals:   05/07/17 0509 05/07/17 0535 05/07/17 0551 05/07/17 0622  BP: (!) 91/47 (!) 92/34 (!) 93/34 (!) 109/34  Pulse: 74   77  Resp: 15   18  Temp: 100.1 F (37.8 C)     TempSrc: Oral     SpO2: 100%   100%  Weight:      Height:        Intake/Output Summary (Last 24 hours) at 05/07/17 0729 Last data filed at 05/07/17 0311  Gross per 24 hour  Intake               50 ml  Output  0 ml  Net               50 ml   Filed Weights   05/07/17 0115  Weight: 88.7 kg (195 lb 9.6 oz)    Examination:  eomi ncat dishevelled cta b abd soft nt nd no rebound no gaurding   Sacral decubiti are stage 1 with only denudation of skin Neuro is intact [facility] doesn't seem purulent nor does PPM  Data Reviewed: I have personally reviewed following labs and imaging studies  CBC:  Recent Labs Lab 05/01/17 0339 05/02/17 1153 05/06/17 1955 05/06/17 2000 05/07/17 0355  WBC 6.5 5.9 7.2  --  9.6  NEUTROABS  --   --  4.8  --  7.5  HGB 7.6* 8.9* 7.3* 10.9* 9.2*  HCT 24.7* 28.6* 23.8* 32.0* 29.7*  MCV 86.4 88.0 89.5  --  89.2  PLT 208  229 287  --  397   Basic Metabolic Panel:  Recent Labs Lab 05/01/17 0339 05/06/17 1955 05/06/17 2000 05/07/17 0355  NA 132* 133* 138 137  K 4.1 3.6 3.6 4.0  CL 94* 98* 99* 101  CO2 24 25  --  22  GLUCOSE 133* 120* 122* 115*  BUN 35* 24* 28* 28*  CREATININE 6.92* 5.70* 5.80* 6.25*  CALCIUM 8.0* 8.6*  --  8.5*   GFR: Estimated Creatinine Clearance: 11.6 mL/min (A) (by C-G formula based on SCr of 6.25 mg/dL (H)). Liver Function Tests:  Recent Labs Lab 05/06/17 1955  AST 19  ALT <5*  ALKPHOS 56  BILITOT 0.7  PROT 7.4  ALBUMIN 3.1*   No results for input(s): LIPASE, AMYLASE in the last 168 hours. No results for input(s): AMMONIA in the last 168 hours. Coagulation Profile:  Recent Labs Lab 05/01/17 0339 05/02/17 0515 05/06/17 1955 05/07/17 0355  INR 2.25 1.79 1.68 1.93   Cardiac Enzymes: No results for input(s): CKTOTAL, CKMB, CKMBINDEX, TROPONINI in the last 168 hours. BNP (last 3 results) No results for input(s): PROBNP in the last 8760 hours. HbA1C: No results for input(s): HGBA1C in the last 72 hours. CBG:  Recent Labs Lab 05/01/17 1822 05/01/17 2237 05/02/17 0739 05/02/17 0827 05/02/17 1157  GLUCAP 98 159* 112* 116* 167*   Lipid Profile: No results for input(s): CHOL, HDL, LDLCALC, TRIG, CHOLHDL, LDLDIRECT in the last 72 hours. Thyroid Function Tests: No results for input(s): TSH, T4TOTAL, FREET4, T3FREE, THYROIDAB in the last 72 hours. Anemia Panel: No results for input(s): VITAMINB12, FOLATE, FERRITIN, TIBC, IRON, RETICCTPCT in the last 72 hours. Urine analysis:    Component Value Date/Time   COLORURINE YELLOW 05/15/2013 0007   APPEARANCEUR CLEAR 05/15/2013 0007   LABSPEC 1.014 05/15/2013 0007   PHURINE 8.5 (H) 05/15/2013 0007   GLUCOSEU 250 (A) 05/15/2013 0007   GLUCOSEU 500 (?) 11/06/2009 0948   HGBUR NEGATIVE 05/15/2013 0007   BILIRUBINUR NEGATIVE 05/15/2013 0007   KETONESUR NEGATIVE 05/15/2013 0007   PROTEINUR >300 (A) 05/15/2013  0007   UROBILINOGEN 0.2 05/15/2013 0007   NITRITE NEGATIVE 05/15/2013 0007   LEUKOCYTESUR NEGATIVE 05/15/2013 0007     Radiology Studies: Reviewed images personally in health database    Scheduled Meds: . amiodarone  200 mg Oral Daily  . atorvastatin  40 mg Oral q1800  . calcium acetate  1,334 mg Oral TID WC  . cinacalcet  30 mg Oral QPM  . insulin aspart  0-5 Units Subcutaneous QHS  . insulin aspart  0-9 Units Subcutaneous TID WC  . ketorolac  1 drop  Left Eye QHS  . latanoprost  1 drop Both Eyes QHS  . pantoprazole  40 mg Oral BID AC  . QUEtiapine  25 mg Oral BID  . sorbitol, milk of mag, mineral oil, glycerin (SMOG) enema  960 mL Rectal Once  . Warfarin - Pharmacist Dosing Inpatient   Does not apply q1800   Continuous Infusions: . piperacillin-tazobactam (ZOSYN)  IV Stopped (05/07/17 0311)  . [START ON 05/08/2017] vancomycin       LOS: 0 days    Time spent: [place], MD [facility] ([facility]   If 7PM-7AM, please contact night-coverage [email or web address] Password [email or web address] 05/07/2017, 7:29 AM

## 2017-05-07 NOTE — Consult Note (Addendum)
Hospital Consult    Reason for Consult:  fevers Referring Physician:  Verlon Au  MRN #:  [id number]  History of Present Illness: This is a 66 y.o. male admitted with fevers. He has esrd on hd t-th-sa via left ij tdc that appears to be from April of this year. He also has a recent pacemaker placement and before that a left arm av fistula that required hematoma evacuation. Also has decubitus ulceration and blood cultures with gram positive rods may be contaminant.  Past Medical History:  Diagnosis Date  . Anemia   . Antral ulcer 2014   small  . BENIGN PROSTATIC HYPERTROPHY   . Bipolar disorder ([place])    "sometimes" (10/07/2016)  . CHOLELITHIASIS   . Chronic combined systolic and diastolic CHF (congestive heart failure) ([place])   . Complication of anesthesia    wife states pt had trouble waking up with in Nov., 2014  . CVA (cerebral vascular accident) ([place]) 07/2007  . DEPRESSION   . DIABETES MELLITUS, TYPE II    diet control  . ERECTILE DYSFUNCTION   . ESRD on hemodialysis ([place])    ESRD due to DM/HTN. Started dialysis in November 2013.  HD TTS at [facility] [place].  [name] GERD   . GI bleed    due to gastritis, discharged 10/02/16/notes 10/07/2016  . Hepatitis C    C - has been treated  . History of Clostridium difficile   . History of kidney stones   . HYPERTENSION   . LBBB (left bundle branch block)   . Morbid obesity ([place])   . PAF (paroxysmal atrial fibrillation) ([place])    a. Dx 12/2015.    Past Surgical History:  Procedure Laterality Date  . AMPUTATION Left 05/12/2013   Procedure: AMPUTATION RAY;  Surgeon: Newt Minion, MD;  Location: [place];  Service: Orthopedics;  Laterality: Left;  Left Foot 1st Ray Amputation  . AMPUTATION Left 06/09/2013   Procedure: AMPUTATION BELOW KNEE;  Surgeon: Newt Minion, MD;  Location: [place];  Service: Orthopedics;  Laterality: Left;  Left Below Knee Amputation and removal proximal screws IM tibial nail  . AMPUTATION Right 09/08/2013   Procedure: AMPUTATION BELOW KNEE;  Surgeon: Newt Minion, MD;  Location: [place];  Service: Orthopedics;  Laterality: Right;  Right Below Knee Amputation  . AMPUTATION Right 10/11/2013   Procedure: AMPUTATION BELOW KNEE;  Surgeon: Newt Minion, MD;  Location: [place];  Service: Orthopedics;  Laterality: Right;  Right Below Knee Amputation Revision  . AV FISTULA PLACEMENT  06/14/2012   Procedure: ARTERIOVENOUS (AV) FISTULA CREATION;  Surgeon: Angelia Mould, MD;  Location: [facility] OR;  Service: Vascular;  Laterality: Left;  Left basilic vein transposition with fistula.  [name] BASCILIC VEIN TRANSPOSITION Left 02/17/2017   Procedure: LEFT 1ST STAGE BRACHIAL VEIN TRANSPOSITION;  Surgeon: Conrad Skyline View, MD;  Location: [place];  Service: Vascular;  Laterality: Left;  . BASCILIC VEIN TRANSPOSITION Left 04/21/2017   Procedure: LEFT 2ND STAGE BRACHIAL VEIN TRANSPOSITION;  Surgeon: Conrad Belden, MD;  Location: [place];  Service: Vascular;  Laterality: Left;  . COLONOSCOPY N/A 10/28/2012   Procedure: COLONOSCOPY;  Surgeon: Jeryl Columbia, MD;  Location: [facility] ENDOSCOPY;  Service: Endoscopy;  Laterality: N/A;  . COLONOSCOPY N/A 11/02/2012   Procedure: COLONOSCOPY;  Surgeon: Cleotis Nipper, MD;  Location: [facility] ENDOSCOPY;  Service: Endoscopy;  Laterality: N/A;  . COLONOSCOPY N/A 11/03/2012   Procedure: COLONOSCOPY;  Surgeon: Cleotis Nipper, MD;  Location:  [place] ENDOSCOPY;  Service: Endoscopy;  Laterality: N/A;  . COLONOSCOPY N/A 09/16/2016   Procedure: COLONOSCOPY;  Surgeon: Jerene Bears, MD;  Location: [facility] ENDOSCOPY;  Service: Gastroenterology;  Laterality: N/A;  . ENTEROSCOPY N/A 11/08/2012   Procedure: ENTEROSCOPY;  Surgeon: Wonda Horner, MD;  Location: [facility] ENDOSCOPY;  Service: Endoscopy;  Laterality: N/A;  . ESOPHAGOGASTRODUODENOSCOPY N/A 11/02/2012   Procedure: ESOPHAGOGASTRODUODENOSCOPY (EGD);  Surgeon: Cleotis Nipper, MD;  Location: [facility] ENDOSCOPY;  Service: Endoscopy;  Laterality: N/A;  . ESOPHAGOGASTRODUODENOSCOPY (EGD) WITH  PROPOFOL N/A 09/16/2016   Procedure: ESOPHAGOGASTRODUODENOSCOPY (EGD) WITH PROPOFOL;  Surgeon: Jerene Bears, MD;  Location: [facility] ENDOSCOPY;  Service: Gastroenterology;  Laterality: N/A;  . EXCHANGE OF A DIALYSIS CATHETER Left 11/13/2016   Procedure: EXCHANGE OF A DIALYSIS CATHETER;  Surgeon: Elam Dutch, MD;  Location: [place];  Service: Vascular;  Laterality: Left;  . EYE SURGERY Left    to remove scar tissue  . GIVENS CAPSULE STUDY N/A 11/04/2012   Procedure: GIVENS CAPSULE STUDY;  Surgeon: Cleotis Nipper, MD;  Location: [facility] ENDOSCOPY;  Service: Endoscopy;  Laterality: N/A;  . GIVENS CAPSULE STUDY N/A 09/29/2016   Procedure: GIVENS CAPSULE STUDY;  Surgeon: Manus Gunning, MD;  Location: [place];  Service: Gastroenterology;  Laterality: N/A;  try to keep pt up in bedside chair as much as possible during the study.    [name] HARDWARE REMOVAL Left 06/09/2013   Procedure: HARDWARE REMOVAL;  Surgeon: Newt Minion, MD;  Location: [place];  Service: Orthopedics;  Laterality: Left;  Left Below Knee Amputation  and Removal proximal screws IM tibial nail  . HEMATOMA EVACUATION Left 04/29/2017   Procedure: EVACUATION HEMATOMA LEFT ARM;  Surgeon: Conrad Ramos, MD;  Location: [place];  Service: Vascular;  Laterality: Left;  . INSERTION OF DIALYSIS CATHETER N/A 11/09/2016   Procedure: INSERTION OF TEMPORARY DIALYSIS CATHETER;  Surgeon: Elam Dutch, MD;  Location: [place];  Service: Vascular;  Laterality: N/A;  . IR GENERIC HISTORICAL  09/27/2016   IR US GUIDE VASC ACCESS RIGHT 09/27/2016 Aletta Edouard, MD [facility]INTER[facility]  . IR GENERIC HISTORICAL  09/27/2016   IR FLUORO GUIDE CV LINE RIGHT 09/27/2016 Aletta Edouard, MD [facility]  . LIGATION OF ARTERIOVENOUS  FISTULA Left 11/09/2016   Procedure: LIGATION OF ARTERIOVENOUS  FISTULA;  Surgeon: Elam Dutch, MD;  Location: [place];  Service: Vascular;  Laterality: Left;  . NEPHRECTOMY     partial RR  . ORIF FIBULA FRACTURE Left 09/09/2012   Procedure: OPEN  REDUCTION INTERNAL FIXATION (ORIF) FIBULA FRACTURE;  Surgeon: Johnny Bridge, MD;  Location: [place];  Service: Orthopedics;  Laterality: Left;  . PACEMAKER IMPLANT N/A 04/30/2017   Procedure: PACEMAKER IMPLANT;  Surgeon: Evans Lance, MD;  Location: [place] CV LAB;  Service: Cardiovascular;  Laterality: N/A;  . TEMPORARY PACEMAKER N/A 04/29/2017   Procedure: TEMPORARY PACEMAKER;  Surgeon: Lorretta Harp, MD;  Location: [place] CV LAB;  Service: Cardiovascular;  Laterality: N/A;  . TIBIA IM NAIL INSERTION Left 09/09/2012   Procedure: INTRAMEDULLARY (IM) NAIL TIBIAL;  Surgeon: Johnny Bridge, MD;  Location: [place];  Service: Orthopedics;  Laterality: Left;  left tibial nail and open reduction internal fixation left fibula fracture  . UPPER EXTREMITY VENOGRAPHY N/A 12/14/2016   Procedure: Bilateral Upper Extremity Venography and Central Venography;  Surgeon: Conrad [place], MD;  Location: [place] CV LAB;  Service: Cardiovascular;  Laterality: N/A;    Allergies  Allergen Reactions  .  No Known Allergies     Prior to Admission medications   Medication Sig Start Date End Date Taking? Authorizing Provider  acetaminophen (TYLENOL) 500 MG tablet Take 500 mg by mouth every 6 (six) hours as needed for mild pain.   Yes [provider]  amiodarone (PACERONE) 200 MG tablet TAKE 1 TABLET BY MOUTH DAILY, START THIS ON 11/06/16 Patient taking differently: TAKE 200 mg TABLET BY MOUTH DAILY, START THIS ON 11/06/16 11/02/16  Yes Croitoru, Mihai, MD  atorvastatin (LIPITOR) 40 MG tablet TAKE 1 TABLET(40 MG) BY MOUTH DAILY AT 6 PM 12/02/16  Yes [name], MD  Bromfenac Sodium (PROLENSA) 0.07 % SOLN Place 1 drop into the left eye at bedtime.   Yes [provider]  calcium acetate (PHOSLO) 667 MG capsule Take 1,334 mg by mouth 3 (three) times daily with meals.    Yes [provider]  latanoprost (XALATAN) 0.005 % ophthalmic solution Place 1 drop into both eyes at bedtime.   06/12/16  Yes [provider]  oxyCODONE-acetaminophen (ROXICET) 5-325 MG tablet Take 1 tablet by mouth every 4 (four) hours as needed for moderate pain. 04/26/74  Yes Delora Fuel, MD  pantoprazole (PROTONIX) 40 MG tablet Take 1 tablet (40 mg total) by mouth 2 (two) times daily before a meal. 10/14/16  Yes [name], MD  QUEtiapine (SEROQUEL) 25 MG tablet TAKE 1 TABLET(25 MG) BY MOUTH TWICE DAILY 01/05/17  Yes [name], MD  SENSIPAR 30 MG tablet Take 30 mg by mouth every evening.  01/09/15  Yes [provider]  silver sulfADIAZINE (SILVADENE) 1 % cream Apply 1 application topically daily as needed (irratation). 03/01/17  Yes Suzan Slick, NP  traMADol (ULTRAM) 50 MG tablet Take 50 mg by mouth every 6 (six) hours as needed for moderate pain.   Yes [provider]  warfarin (COUMADIN) 2.5 MG tablet Take 1 tablet daily except 1.5 tablets on Wed and Fri or as directed by Anticoagulation Clinic Patient taking differently: Take 2.5-3.75 mg by mouth See admin instructions. Take 2.5 mg  tablet daily except  Takes 3.75 mg on Friday 12/28/16  Yes Fay Records, MD  SSD 1 % cream APPLY EXTERNALLY TO THE AFFECTED AREA DAILY Patient not taking: Reported on 05/06/2017 04/27/17   Newt Minion, MD    Social History   Social History  . Marital status: Married    Spouse name: N/A  . Number of children: 2  . Years of education: 16   Occupational History  . disabled due to stroke Retired   Social History Main Topics  . Smoking status: Never Smoker  . Smokeless tobacco: Never Used  . Alcohol use No  . Drug use: No  . Sexual activity: Yes    Birth control/ protection: None   Other Topics Concern  . 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 Diabetes Mother   . Hypertension Mother   . Heart attack Father   . Hypertension Father     . Coronary artery disease Other     ROS: he has no complaints other than right arm swelling   Physical Examination  Vitals:   05/07/17 0808 05/07/17 1319  BP: (!) 96/59 91/63  Pulse: 76 81  Resp: 16 18  Temp: 99.9 F (37.7 C)  98.9 F (37.2 C)  SpO2: 100% 99%   Body mass index is 34.65 kg/m.  General:  WDWN in NAD HENT: WNL, normocephalic Pulmonary: normal non-labored breathing Cardiac: rrr Abdomen: soft, NT/ND, no masses Extremities: left upper arm swelling with incision in tact with staples and sutures. Palpable thrill underlying the incision  Neurologic: left grip strength 3/5  CBC    Component Value Date/Time   WBC 9.6 05/07/2017 0355   RBC 3.33 (L) 05/07/2017 0355   HGB 9.2 (L) 05/07/2017 0355   HCT 29.7 (L) 05/07/2017 0355   PLT 218 05/07/2017 0355   MCV 89.2 05/07/2017 0355   MCH 27.6 05/07/2017 0355   MCHC 31.0 05/07/2017 0355   RDW 18.2 (H) 05/07/2017 0355   LYMPHSABS 0.8 05/07/2017 0355   MONOABS 1.2 (H) 05/07/2017 0355   EOSABS 0.1 05/07/2017 0355   BASOSABS 0.0 05/07/2017 0355    BMET    Component Value Date/Time   NA 137 05/07/2017 0355   K 4.0 05/07/2017 0355   CL 101 05/07/2017 0355   CO2 22 05/07/2017 0355   GLUCOSE 115 (H) 05/07/2017 0355   BUN 28 (H) 05/07/2017 0355   CREATININE 6.25 (H) 05/07/2017 0355   CREATININE 8.97 (H) 10/07/2015 1500   CALCIUM 8.5 (L) 05/07/2017 0355   GFRNONAA 8 (L) 05/07/2017 0355   GFRNONAA 6 (L) 10/07/2015 1500   GFRAA 10 (L) 05/07/2017 0355   GFRAA 6 (L) 10/07/2015 1500    COAGS: Lab Results  Component Value Date   INR 1.93 05/07/2017   INR 1.68 05/06/2017   INR 1.79 05/02/2017      ASSESSMENT/PLAN: This is a 66 y.o. male with prelim positive blood cultures possible contaminatnt. If cultures are truly positive and without other source the tdc will need removed and can replace after line holiday but will most likely need to be in the groin given his pacer on the right and known central venous  occlusive disease. Could remove after dialysis tomorrow if deemed necessary and plan to replace early next week. INR <1.8 would be preferable for any intervention.   Khizar Fiorella C. Donzetta Matters, MD [facility] Office: [phone] Pager: [phone]

## 2017-05-07 NOTE — Care Management Note (Signed)
Case Management Note  Patient Details  Name: Oscar Castillo MRN: [id number] Date of Birth: 06/05/1951  Subjective/Objective:           Patient from home with wife, In obs for pain. States that his wife drives, takes him to [place] and gets Rx at pharmacy. Patient states that he ambulates independently. He denies ever having [facility] care. It's noted patient has had 3 admissions in the past 6 months. CM will follow for DC planning needs.          Action/Plan:   Expected Discharge Date:  05/09/17               Expected Discharge Plan:  Home/Self Care  In-House Referral:     Discharge planning Services  CM Consult  Post Acute Care Choice:    Choice offered to:     DME Arranged:    DME Agency:     HH Arranged:    HH Agency:     Status of Service:  In process, will continue to follow  If discussed at Long Length of Stay Meetings, dates discussed:    Additional Comments:  Carles Collet, RN 05/07/2017, 11:43 AM

## 2017-05-07 NOTE — Progress Notes (Addendum)
Pt arrived to 4E16 from ED. Tele leads placed, verified x2. VSS. Pt denies pain, just states that he feels "rough". Slightly drowsy from recent IV pain medication. CHG bath completed. ED tech and RN report very successful disimpaction in ED, so enema withheld. Two small pressure wounds noted, one in gluteal folds and one on L buttock. Covered with foam strips. Pt's wife at bedside, deny any needs at this time. Oriented to room and call light. Will continue to monitor.

## 2017-05-07 NOTE — Progress Notes (Signed)
[facility] for Warfarin  Indication: atrial fibrillation, stroke  Allergies  Allergen Reactions  . No Known Allergies    Vital Signs: Temp: 99.9 F (37.7 C) (10/12 0808) Temp Source: Oral (10/12 0808) BP: 96/59 (10/12 0808) Pulse Rate: 76 (10/12 0808)  Labs:  Recent Labs  05/06/17 1955 05/06/17 2000 05/07/17 0355  HGB 7.3* 10.9* 9.2*  HCT 23.8* 32.0* 29.7*  PLT 287  --  218  LABPROT 19.7*  --  21.9*  INR 1.68  --  1.93  CREATININE 5.70* 5.80* 6.25*    Estimated Creatinine Clearance: 11.6 mL/min (A) (by C-G formula based on SCr of 6.25 mg/dL (H)).  Assessment: 66 y/o on warfarin for multiple indications, here with shortness of breath, INR is sub-therapeutic on admission at 1.68, now up to 1.93.  PTA warfarin dose: 3.75 mg Fridays, 2.5 mg all other days  Hgb 9.2, plts 218- no bleeding noted.  Goal of Therapy:  INR 2-3 Monitor platelets by anticoagulation protocol: Yes   Plan:  Warfarin 4mg  PO x 1 again tonight  Daily INR, CBC Monitor for bleeding  Mikea Quadros D. Brindle Leyba, PharmD, [place] Clinical Pharmacist Pager: [phone] Clinical Phone for 05/07/2017 until 3:30pm: [phone] If after 3:30pm, please call main pharmacy at [phone] 05/07/2017 12:20 PM

## 2017-05-07 NOTE — Consult Note (Signed)
[facility] Renal Consultation Note    Indication for Consultation:  Management of ESRD/hemodialysis, anemia, hypertension/volume, and secondary hyperparathyroidism. PCP:  HPI: Oscar Castillo is a 66 y.o. male with ESRD, DM, HTN, Type 2 DM, Hx CVA, paroxysmal A-fib (on warfarin), Hx complete heart block (s/p pacemaker 10/5) who was admitted with fever, concern for sepsis.   Pt unable to give me a full history today although answering questions appropriately. Per notes, presented to ED via EMS after HD yesterday with dyspnea and generalized aches. In ED, found to be afebrile with labs showing WBC 7.2, Hgb 7.3, K 3.6. CT abdomen showed constipation. CXR without edema/pneumonia. There was concern for new decubitus ulcerations. He was started empirically on Vanc/Zosyn. Overnight, he spiked his temperature to 100.57F. Seen in room this morning. Currently denies pain, dyspnea, but says he feels weak/tired overall. This is to the point that although he can move both of his arms, he is asking someone to feed him (?).   S/p recent AVF superficialization on 9/83 which was complicated by hematoma development requiring surgical evacuation on 10/4. Post-op, he developed complete heart block and VT, required external pacing and then underwent pacemaker on 10/5. He was just discharged on 05/02/17.  From renal standpoint, he dialyzes on TTS schedule at [facility]. He did have his last HD yesterday using his TDC.   Past Medical History:  Diagnosis Date  . Anemia   . Antral ulcer 2014   small  . BENIGN PROSTATIC HYPERTROPHY   . Bipolar disorder ([place])    "sometimes" (10/07/2016)  . CHOLELITHIASIS   . Chronic combined systolic and diastolic CHF (congestive heart failure) ([place])   . Complication of anesthesia    wife states pt had trouble waking up with in Nov., 2014  . CVA (cerebral vascular accident) ([place]) 07/2007  . DEPRESSION   . DIABETES MELLITUS, TYPE II    diet control  . ERECTILE  DYSFUNCTION   . ESRD on hemodialysis ([place])    ESRD due to DM/HTN. Started dialysis in November 2013.  HD TTS at [facility] on [address].  [name] GERD   . GI bleed    due to gastritis, discharged 10/02/16/notes 10/07/2016  . Hepatitis C    C - has been treated  . History of Clostridium difficile   . History of kidney stones   . HYPERTENSION   . LBBB (left bundle branch block)   . Morbid obesity ([place])   . PAF (paroxysmal atrial fibrillation) ([place])    a. Dx 12/2015.   Past Surgical History:  Procedure Laterality Date  . AMPUTATION Left 05/12/2013   Procedure: AMPUTATION RAY;  Surgeon: Newt Minion, MD;  Location: [place];  Service: Orthopedics;  Laterality: Left;  Left Foot 1st Ray Amputation  . AMPUTATION Left 06/09/2013   Procedure: AMPUTATION BELOW KNEE;  Surgeon: Newt Minion, MD;  Location: [place];  Service: Orthopedics;  Laterality: Left;  Left Below Knee Amputation and removal proximal screws IM tibial nail  . AMPUTATION Right 09/08/2013   Procedure: AMPUTATION BELOW KNEE;  Surgeon: Newt Minion, MD;  Location: [place];  Service: Orthopedics;  Laterality: Right;  Right Below Knee Amputation  . AMPUTATION Right 10/11/2013   Procedure: AMPUTATION BELOW KNEE;  Surgeon: Newt Minion, MD;  Location: [place];  Service: Orthopedics;  Laterality: Right;  Right Below Knee Amputation Revision  . AV FISTULA PLACEMENT  06/14/2012   Procedure: ARTERIOVENOUS (AV) FISTULA CREATION;  Surgeon: Judeth Cornfield  Scot Dock, MD;  Location: [facility] OR;  Service: Vascular;  Laterality: Left;  Left basilic vein transposition with fistula.  [name] BASCILIC VEIN TRANSPOSITION Left 02/17/2017   Procedure: LEFT 1ST STAGE BRACHIAL VEIN TRANSPOSITION;  Surgeon: Conrad Bolckow, MD;  Location: [place];  Service: Vascular;  Laterality: Left;  . BASCILIC VEIN TRANSPOSITION Left 04/21/2017   Procedure: LEFT 2ND STAGE BRACHIAL VEIN TRANSPOSITION;  Surgeon: Conrad Summitville, MD;  Location: [name];  Service: Vascular;  Laterality: Left;  . COLONOSCOPY  N/A 10/28/2012   Procedure: COLONOSCOPY;  Surgeon: Jeryl Columbia, MD;  Location: [facility] ENDOSCOPY;  Service: Endoscopy;  Laterality: N/A;  . COLONOSCOPY N/A 11/02/2012   Procedure: COLONOSCOPY;  Surgeon: Cleotis Nipper, MD;  Location: [facility] ENDOSCOPY;  Service: Endoscopy;  Laterality: N/A;  . COLONOSCOPY N/A 11/03/2012   Procedure: COLONOSCOPY;  Surgeon: Cleotis Nipper, MD;  Location: [facility] ENDOSCOPY;  Service: Endoscopy;  Laterality: N/A;  . COLONOSCOPY N/A 09/16/2016   Procedure: COLONOSCOPY;  Surgeon: Jerene Bears, MD;  Location: [facility] ENDOSCOPY;  Service: Gastroenterology;  Laterality: N/A;  . ENTEROSCOPY N/A 11/08/2012   Procedure: ENTEROSCOPY;  Surgeon: Wonda Horner, MD;  Location: [facility] ENDOSCOPY;  Service: Endoscopy;  Laterality: N/A;  . ESOPHAGOGASTRODUODENOSCOPY N/A 11/02/2012   Procedure: ESOPHAGOGASTRODUODENOSCOPY (EGD);  Surgeon: Cleotis Nipper, MD;  Location: [facility] ENDOSCOPY;  Service: Endoscopy;  Laterality: N/A;  . ESOPHAGOGASTRODUODENOSCOPY (EGD) WITH PROPOFOL N/A 09/16/2016   Procedure: ESOPHAGOGASTRODUODENOSCOPY (EGD) WITH PROPOFOL;  Surgeon: Jerene Bears, MD;  Location: [facility] ENDOSCOPY;  Service: Gastroenterology;  Laterality: N/A;  . EXCHANGE OF A DIALYSIS CATHETER Left 11/13/2016   Procedure: EXCHANGE OF A DIALYSIS CATHETER;  Surgeon: Elam Dutch, MD;  Location: [place];  Service: Vascular;  Laterality: Left;  . EYE SURGERY Left    to remove scar tissue  . GIVENS CAPSULE STUDY N/A 11/04/2012   Procedure: GIVENS CAPSULE STUDY;  Surgeon: Cleotis Nipper, MD;  Location: [facility] ENDOSCOPY;  Service: Endoscopy;  Laterality: N/A;  . GIVENS CAPSULE STUDY N/A 09/29/2016   Procedure: GIVENS CAPSULE STUDY;  Surgeon: Manus Gunning, MD;  Location: [place];  Service: Gastroenterology;  Laterality: N/A;  try to keep pt up in bedside chair as much as possible during the study.    [name] HARDWARE REMOVAL Left 06/09/2013   Procedure: HARDWARE REMOVAL;  Surgeon: Newt Minion, MD;  Location: [place];  Service:  Orthopedics;  Laterality: Left;  Left Below Knee Amputation  and Removal proximal screws IM tibial nail  . HEMATOMA EVACUATION Left 04/29/2017   Procedure: EVACUATION HEMATOMA LEFT ARM;  Surgeon: Conrad [place], MD;  Location: [place];  Service: Vascular;  Laterality: Left;  . INSERTION OF DIALYSIS CATHETER N/A 11/09/2016   Procedure: INSERTION OF TEMPORARY DIALYSIS CATHETER;  Surgeon: Elam Dutch, MD;  Location: [place];  Service: Vascular;  Laterality: N/A;  . IR GENERIC HISTORICAL  09/27/2016   IR US GUIDE VASC ACCESS RIGHT 09/27/2016 Aletta Edouard, MD [facility]INTER[facility]  . IR GENERIC HISTORICAL  09/27/2016   IR FLUORO GUIDE CV LINE RIGHT 09/27/2016 Aletta Edouard, MD [facility]  . LIGATION OF ARTERIOVENOUS  FISTULA Left 11/09/2016   Procedure: LIGATION OF ARTERIOVENOUS  FISTULA;  Surgeon: Elam Dutch, MD;  Location: [place];  Service: Vascular;  Laterality: Left;  . NEPHRECTOMY     partial RR  . ORIF FIBULA FRACTURE Left 09/09/2012   Procedure: OPEN REDUCTION INTERNAL FIXATION (ORIF) FIBULA FRACTURE;  Surgeon: Johnny Bridge, MD;  Location: [place];  Service: Orthopedics;  Laterality: Left;  . PACEMAKER IMPLANT N/A 04/30/2017   Procedure: PACEMAKER IMPLANT;  Surgeon: Evans Lance, MD;  Location: [place] CV LAB;  Service: Cardiovascular;  Laterality: N/A;  . TEMPORARY PACEMAKER N/A 04/29/2017   Procedure: TEMPORARY PACEMAKER;  Surgeon: Lorretta Harp, MD;  Location: [place] CV LAB;  Service: Cardiovascular;  Laterality: N/A;  . TIBIA IM NAIL INSERTION Left 09/09/2012   Procedure: INTRAMEDULLARY (IM) NAIL TIBIAL;  Surgeon: Johnny Bridge, MD;  Location: [place];  Service: Orthopedics;  Laterality: Left;  left tibial nail and open reduction internal fixation left fibula fracture  . UPPER EXTREMITY VENOGRAPHY N/A 12/14/2016   Procedure: Bilateral Upper Extremity Venography and Central Venography;  Surgeon: Conrad Ponshewaing, MD;  Location: [place] CV LAB;  Service: Cardiovascular;  Laterality:  N/A;   Family History  Problem Relation Age of Onset  . Diabetes Mother   . Hypertension Mother   . Heart attack Father   . Hypertension Father   . Coronary artery disease Other    Social History:  reports that he has never smoked. He has never used smokeless tobacco. He reports that he does not drink alcohol or use drugs.  ROS: As per HPI otherwise negative.  Physical Exam: Vitals:   05/07/17 0535 05/07/17 0551 05/07/17 0622 05/07/17 0808  BP: (!) 92/34 (!) 93/34 (!) 109/34 (!) 96/59  Pulse:   77 76  Resp:   18 16  Temp:    99.9 F (37.7 C)  TempSrc:    Oral  SpO2:   100% 100%  Weight:      Height:         General: Well developed, well nourished, obese man in no acute distress. Head: Normocephalic, atraumatic, sclera non-icteric, mucus membranes are moist. Neck: Supple without lymphadenopathy/masses. JVD not elevated. Lungs: Clear bilaterally to auscultation without wheezes, rales, or rhonchi. Breathing is unlabored. Heart: RRR with normal S1, S2. No murmurs, rubs, or gallops appreciated. Abdomen: Soft, non-tender, non-distended with normoactive bowel sounds.  Lower extremities: No edema or ischemic changes, no open wounds. Neuro: Alert and oriented X 3. Moves all extremities spontaneously. Psych:  Responds to questions appropriately with a normal affect. Dialysis Access: TDC in L chest. LUE AVF present, + significant edema of entire L arm.  Allergies  Allergen Reactions  . No Known Allergies    Prior to Admission medications   Medication Sig Start Date End Date Taking? Authorizing Provider  acetaminophen (TYLENOL) 500 MG tablet Take 500 mg by mouth every 6 (six) hours as needed for mild pain.   Yes [provider]  amiodarone (PACERONE) 200 MG tablet TAKE 1 TABLET BY MOUTH DAILY, START THIS ON 11/06/16 Patient taking differently: TAKE 200 mg TABLET BY MOUTH DAILY, START THIS ON 11/06/16 11/02/16  Yes Croitoru, Mihai, MD  atorvastatin (LIPITOR) 40 MG tablet TAKE  1 TABLET(40 MG) BY MOUTH DAILY AT 6 PM 12/02/16  Yes [name], MD  Bromfenac Sodium (PROLENSA) 0.07 % SOLN Place 1 drop into the left eye at bedtime.   Yes [provider]  calcium acetate (PHOSLO) 667 MG capsule Take 1,334 mg by mouth 3 (three) times daily with meals.    Yes [provider]  latanoprost (XALATAN) 0.005 % ophthalmic solution Place 1 drop into both eyes at bedtime.  06/12/16  Yes [provider]  oxyCODONE-acetaminophen (ROXICET) 5-325 MG tablet Take 1 tablet by mouth every 4 (four) hours as needed for moderate pain. 04/29/17  Yes Delora Fuel, MD  pantoprazole (PROTONIX) 40 MG tablet Take 1 tablet (40 mg total) by mouth 2 (two) times daily before a meal. 10/14/16  Yes [name], MD  QUEtiapine (SEROQUEL) 25 MG tablet TAKE 1 TABLET(25 MG) BY MOUTH TWICE DAILY 01/05/17  Yes [name], MD  SENSIPAR 30 MG tablet Take 30 mg by mouth every evening.  01/09/15  Yes [provider]  silver sulfADIAZINE (SILVADENE) 1 % cream Apply 1 application topically daily as needed (irratation). 03/01/17  Yes Suzan Slick, NP  traMADol (ULTRAM) 50 MG tablet Take 50 mg by mouth every 6 (six) hours as needed for moderate pain.   Yes [provider]  warfarin (COUMADIN) 2.5 MG tablet Take 1 tablet daily except 1.5 tablets on Wed and Fri or as directed by Anticoagulation Clinic Patient taking differently: Take 2.5-3.75 mg by mouth See admin instructions. Take 2.5 mg  tablet daily except  Takes 3.75 mg on Friday 12/28/16  Yes Fay Records, MD  SSD 1 % cream APPLY EXTERNALLY TO THE AFFECTED AREA DAILY Patient not taking: Reported on 05/06/2017 04/27/17   Newt Minion, MD   Current Facility-Administered Medications  Medication Dose Route Frequency Provider Last Rate Last Dose  . acetaminophen (TYLENOL) tablet 650 mg  650 mg Oral Q6H PRN Fuller Plan A, MD   650 mg at 05/07/17 1130   Or  . acetaminophen (TYLENOL) suppository 650 mg  650 mg Rectal Q6H PRN  Fuller Plan A, MD      . amiodarone (PACERONE) tablet 200 mg  200 mg Oral Daily Smith, Rondell A, MD   200 mg at 05/07/17 1131  . atorvastatin (LIPITOR) tablet 40 mg  40 mg Oral q1800 Smith, Rondell A, MD      . calcium acetate (PHOSLO) capsule 1,334 mg  1,334 mg Oral TID WC Smith, Rondell A, MD   1,334 mg at 05/07/17 1131  . cinacalcet (SENSIPAR) tablet 30 mg  30 mg Oral QPM Smith, Rondell A, MD      . insulin aspart (novoLOG) injection 0-5 Units  0-5 Units Subcutaneous QHS Smith, Rondell A, MD      . insulin aspart (novoLOG) injection 0-9 Units  0-9 Units Subcutaneous TID WC Smith, Rondell A, MD      . ipratropium-albuterol (DUONEB) 0.5-2.5 (3) MG/3ML nebulizer solution 3 mL  3 mL Nebulization Q4H PRN Smith, Rondell A, MD      . ketorolac (ACULAR) 0.5 % ophthalmic solution 1 drop  1 drop Left Eye QHS Smith, Rondell A, MD      . latanoprost (XALATAN) 0.005 % ophthalmic solution 1 drop  1 drop Both Eyes QHS Smith, Rondell A, MD      . ondansetron (ZOFRAN) tablet 4 mg  4 mg Oral Q6H PRN Fuller Plan A, MD       Or  . ondansetron (ZOFRAN) injection 4 mg  4 mg Intravenous Q6H PRN Smith, Rondell A, MD      . oxyCODONE-acetaminophen (PERCOCET/ROXICET) 5-325 MG per tablet 1 tablet  1 tablet Oral Q4H PRN Smith, Rondell A, MD      . pantoprazole (PROTONIX) EC tablet 40 mg  40 mg Oral BID AC Smith, Rondell A, MD   40 mg at 05/07/17 1131  . piperacillin-tazobactam (ZOSYN) IVPB 3.375 g  3.375 g Intravenous Q12H Rudisill, Jodean Lima, RPH 12.5 mL/hr at 05/07/17 1135 3.375 g at 05/07/17 1135  . QUEtiapine (SEROQUEL) tablet 25 mg  25 mg Oral BID Tamala Julian,  Rondell A, MD   25 mg at 05/07/17 1131  . silver sulfADIAZINE (SILVADENE) 1 % cream 1 application  1 application Topical Daily PRN Smith, Rondell A, MD      . sorbitol, milk of mag, mineral oil, glycerin (SMOG) enema  960 mL Rectal Once Norval Morton, MD      . Derrill Memo ON 05/08/2017] vancomycin (VANCOCIN) IVPB 1000 mg/200 mL premix  1,000 mg Intravenous Q  T,Th,Sa-HD Rudisill, Jodean Lima, RPH      . warfarin (COUMADIN) tablet 4 mg  4 mg Oral ONCE-1800 Bajbus, Lauren D, RPH      . Warfarin - Pharmacist Dosing Inpatient   Does not apply q1800 Erenest Blank [facility]       Labs: Basic Metabolic Panel:  Recent Labs Lab 05/01/17 0339 05/06/17 1955 05/06/17 2000 05/07/17 0355  NA 132* 133* 138 137  K 4.1 3.6 3.6 4.0  CL 94* 98* 99* 101  CO2 24 25  --  22  GLUCOSE 133* 120* 122* 115*  BUN 35* 24* 28* 28*  CREATININE 6.92* 5.70* 5.80* 6.25*  CALCIUM 8.0* 8.6*  --  8.5*   Liver Function Tests:  Recent Labs Lab 05/06/17 1955  AST 19  ALT <5*  ALKPHOS 56  BILITOT 0.7  PROT 7.4  ALBUMIN 3.1*   CBC:  Recent Labs Lab 05/01/17 0339 05/02/17 1153 05/06/17 1955 05/06/17 2000 05/07/17 0355  WBC 6.5 5.9 7.2  --  9.6  NEUTROABS  --   --  4.8  --  7.5  HGB 7.6* 8.9* 7.3* 10.9* 9.2*  HCT 24.7* 28.6* 23.8* 32.0* 29.7*  MCV 86.4 88.0 89.5  --  89.2  PLT 208 229 287  --  218   CBG:  Recent Labs Lab 05/01/17 2237 05/02/17 0739 05/02/17 0827 05/02/17 1157 05/07/17 1102  GLUCAP 159* 112* 116* 167* 86   Studies/Results: Dg Chest Port 1 View  Result Date: 05/06/2017 CLINICAL DATA:  Initial evaluation for acute shortness of breath. EXAM: PORTABLE CHEST 1 VIEW COMPARISON:  Prior radiograph from 05/01/2017. FINDINGS: Right-sided pacemaker/AICD in place. Left-sided dual lumen hemodialysis catheter in stable position. Stable cardiomegaly. Mediastinal silhouette normal. Lungs hypoinflated. Mild right basilar atelectasis and/ or scarring. No focal infiltrates. No pulmonary edema or definite pleural effusion. No pneumothorax. No acute osseus abnormality. Skin staples present at the medial left upper extremity, suspected to be related to AV fistula. IMPRESSION: 1. Shallow lung inflation with mild right basilar atelectasis and/or scarring. 2. No other active cardiopulmonary disease. 3. Stable cardiomegaly without pulmonary edema. Electronically  Signed   By: Jeannine Boga M.D.   On: 05/06/2017 22:24   Ct Renal Stone Study  Result Date: 05/06/2017 CLINICAL DATA:  Initial evaluation for acute pain in sacral region. EXAM: CT ABDOMEN AND PELVIS WITHOUT CONTRAST TECHNIQUE: Multidetector CT imaging of the abdomen and pelvis was performed following the standard protocol without IV contrast. COMPARISON:  None available. FINDINGS: Lower chest: Bibasilar atelectasis and/ or scarring noted within the visualized lung bases. Cardiac pacemaker electrodes partially visualized. Visualized lungs otherwise clear. Hepatobiliary: Subcentimeter metallic density present within the inferior right hepatic lobe. Liver otherwise demonstrates a normal unenhanced appearance. Subtle hyperdensity within the gallbladder lumen suggestive of cholelithiasis. No imaging findings to suggest acute cholecystitis. No biliary dilatation. Pancreas: Pancreas within normal limits. Spleen: Spleen within normal limits. Adrenals/Urinary Tract: Adrenal glands are normal. Kidneys somewhat atrophic bilaterally. No nephrolithiasis or hydronephrosis. Subcentimeter hypodensity at the inferior pole left kidney too small the characterize, but statistically likely  reflects a small cyst. No radiopaque calculi seen along course of either renal collecting system. No hydroureter. Urinary bladder is displaced anteriorly by prominent stool within the rectal vault. Mild circumferential bladder wall thickening like related incomplete distension. No layering stones within the bladder lumen. Stomach/Bowel: Stomach within normal limits. No evidence for bowel obstruction. Large volume stool seen impacted within the rectal vault. Associated circumferential wall thickening. No other acute inflammatory changes seen about the bowels. Vascular/Lymphatic: Advanced aorto bi-iliac atherosclerotic disease. No aneurysm. No adenopathy. Reproductive: Prostate appears to be absent. Other: No free air or fluid.  Musculoskeletal: No acute osseus abnormality. No worrisome lytic or blastic osseous lesions. Advanced degenerative spondylolysis at L5-S1. Prominent facet arthropathy present within the lower lumbar spine. Multiple prominent degenerative endplate Schmorl's nodes noted. IMPRESSION: 1. Large volume stool impacted within the distal colon/rectal vault, consistent with constipation. 2. No other acute intra-abdominal or pelvic process. 3. No CT evidence for nephrolithiasis or obstructive uropathy. 4. Cholelithiasis. 5. Advanced aorto bi-iliac atherosclerotic disease. Electronically Signed   By: Jeannine Boga M.D.   On: 05/06/2017 22:22   Dialysis Orders:  TTS at [facility] 3:45hr, BFR 450, DFR 800, EDW 86.5kg, 2K/2.5Ca, TDC, UF profile 4 - Heparin 2800 bolus + 2000 mid-run bolus - Mircera 147mcg IV q 2 weeks  Assessment/Plan: 1.  Fever/sepsis: BCx 10/11 pending, looks like growing gram + rods. On Vanc/Zosyn. 2.  ESRD: Continue HD per TTS schedule, next due 10/13. 3.  Hypertension/volume: BP slightly low. Will continue current EDW. 4.  Anemia: Variable Hgb (?), up to 9.2 today. Follow for now. 5.  Metabolic bone disease: Corr Ca ok, Phos pending. Continue binders/sensipar. 6.  Type 2 DM: On insulin, per primary. 7.  A-fib: On amiodarone, warfarin. per primary.  Veneta Penton, PA-C 05/07/2017, 12:43 PM  [facility] Pager: [phone]

## 2017-05-07 NOTE — Progress Notes (Signed)
PHARMACY - PHYSICIAN COMMUNICATION CRITICAL VALUE ALERT - BLOOD CULTURE IDENTIFICATION (BCID)  Results for orders placed or performed during the hospital encounter of 05/06/17  Blood Culture ID Panel (Reflexed) (Collected: 05/06/2017 10:15 PM)  Result Value Ref Range   Enterococcus species NOT DETECTED NOT DETECTED   Listeria monocytogenes NOT DETECTED NOT DETECTED   Staphylococcus species NOT DETECTED NOT DETECTED   Staphylococcus aureus NOT DETECTED NOT DETECTED   Streptococcus species NOT DETECTED NOT DETECTED   Streptococcus agalactiae NOT DETECTED NOT DETECTED   Streptococcus pneumoniae NOT DETECTED NOT DETECTED   Streptococcus pyogenes NOT DETECTED NOT DETECTED   Acinetobacter baumannii NOT DETECTED NOT DETECTED   Enterobacteriaceae species NOT DETECTED NOT DETECTED   Enterobacter cloacae complex NOT DETECTED NOT DETECTED   Escherichia coli NOT DETECTED NOT DETECTED   Klebsiella oxytoca NOT DETECTED NOT DETECTED   Klebsiella pneumoniae NOT DETECTED NOT DETECTED   Proteus species NOT DETECTED NOT DETECTED   Serratia marcescens NOT DETECTED NOT DETECTED   Haemophilus influenzae NOT DETECTED NOT DETECTED   Neisseria meningitidis NOT DETECTED NOT DETECTED   Pseudomonas aeruginosa NOT DETECTED NOT DETECTED   Candida albicans NOT DETECTED NOT DETECTED   Candida glabrata NOT DETECTED NOT DETECTED   Candida krusei NOT DETECTED NOT DETECTED   Candida parapsilosis NOT DETECTED NOT DETECTED   Candida tropicalis NOT DETECTED NOT DETECTED    Name of physician (or Provider) Contacted: texted paged Dr. Verlon Au  Changes to prescribed antibiotics required: no changes required - continue vancomycin and Zosyn  Tillmon Kisling 05/07/2017  1:42 PM

## 2017-05-07 NOTE — Progress Notes (Addendum)
Pt called out for pain medicine this PM, when RN went into room, pt c/o being "unable to open his mouth". Held pill in mouth until verbally queued to swallow. Repeats "I need someone to open me up. I can't open my mouth." When questioned, able to state name, but thinks he's at [facility], and repeats his name over and over and does not answer other questions. Wife states he has never been like this before. Previously AOx4 w/ no hx of confusion. VSS, temp 99. TRH on call MD notified, awaiting call back.  @0010 : Reassessed pt in meantime, pt now AOx4, just c/o pain in abdomen and legs. Told pt it will take some time for oral pain meds to work. Agitated, wishes to leave hospital. Updated wife at bedside on plan of care.  @0500 : Pt AOx4, w/ abdominal pain. WBC 4.6. Temp 98.5 orally.

## 2017-05-07 NOTE — Progress Notes (Addendum)
D/w Dr. Tommy Medal  With only cult being+-could be a contaminant--would NOT remove catheter unless speciated into a pathogen  Would not reculture at this time unless looks infected  Would de-escalate abx coverage if no pathogen

## 2017-05-07 NOTE — Progress Notes (Signed)
Post bolus BP 109/34, will continue to closely monitor pt

## 2017-05-07 NOTE — Progress Notes (Addendum)
Pt BP noted to be low compared to admit VS. 128/95 to 91/47, 93/34 on repeat. Attempted manual, but unable to do so d/t IV in RAC, and LUE restricted by AV graft placement. Tamala Julian, MD notified and 244mL bolus ordered. Also noted to have 100.1 temp this AM. Pt is drowsy but easily arousable. When asked if he would take PRN Tylenol, he said "no" and went back to sleep. When asked if he is in pain, light headed or SOB, he just states again that he "feels rough". Will continue to closely monitor.

## 2017-05-07 NOTE — Care Management Obs Status (Signed)
[place] NOTIFICATION   Patient Details  Name: Oscar Castillo MRN: [id number] Date of Birth: 1950/09/21   Medicare Observation Status Notification Given:  Yes    Carles Collet, RN 05/07/2017, 11:40 AM

## 2017-05-07 NOTE — Progress Notes (Signed)
ANTICOAGULATION CONSULT NOTE - Initial Consult  Pharmacy Consult for Warfarin  Indication: atrial fibrillation, stroke  Allergies  Allergen Reactions  . No Known Allergies    Vital Signs: Temp: 99.2 F (37.3 C) (10/11 1728) Temp Source: Oral (10/11 1728) BP: 166/64 (10/11 2330) Pulse Rate: 87 (10/11 2324)  Labs:  Recent Labs  05/06/17 1955 05/06/17 2000  HGB 7.3* 10.9*  HCT 23.8* 32.0*  PLT 287  --   LABPROT 19.7*  --   INR 1.68  --   CREATININE 5.70* 5.80*    Estimated Creatinine Clearance: 14.9 mL/min (A) (by C-G formula based on SCr of 5.8 mg/dL (H)).   Medical History: Past Medical History:  Diagnosis Date  . Anemia   . Antral ulcer 2014   small  . BENIGN PROSTATIC HYPERTROPHY   . Bipolar disorder ([place])    "sometimes" (10/07/2016)  . CHOLELITHIASIS   . Chronic combined systolic and diastolic CHF (congestive heart failure) ([place])   . Complication of anesthesia    wife states pt had trouble waking up with in Nov., 2014  . CVA (cerebral vascular accident) ([place]) 07/2007  . DEPRESSION   . DIABETES MELLITUS, TYPE II    diet control  . ERECTILE DYSFUNCTION   . ESRD on hemodialysis ([place])    ESRD due to DM/HTN. Started dialysis in November 2013.  HD TTS at [facility] [address].  [name] GERD   . GI bleed    due to gastritis, discharged 10/02/16/notes 10/07/2016  . Hepatitis C    C - has been treated  . History of Clostridium difficile   . History of kidney stones   . HYPERTENSION   . LBBB (left bundle branch block)   . Morbid obesity ([place])   . PAF (paroxysmal atrial fibrillation) ([place])    a. Dx 12/2015.    Assessment: 66 y/o on warfarin for multiple indications, here with shortness of breath, INR is sub-therapeutic on admission at 1.68, Hgb 10.9  PTA warfarin dose: 3.75 mg Fridays, 2.5 mg all other days  Goal of Therapy:  INR 2-3 Monitor platelets by anticoagulation protocol: Yes   Plan:  Warfarin 4 mg PO x 1 now Daily PT/INR Monitor for  bleeding   Narda Bonds 05/07/2017,12:20 AM

## 2017-05-07 NOTE — Consult Note (Addendum)
[place] Nurse wound consult note Reason for Consult: Consult requested for sacrum/buttocks.  Pt previously had a pressure injury to his sacrum which has healed, according to his family member at the bedside.  She noticed yesterday when she was cleaning him of incontinent stool that a "new place of redness" had occurred over the previous site which had healed. Wound type: Red macerated partial thickness skin loss to bilat buttocks; appearance consistent with moisture associated skin damage.  Stage 2 pressure injury located over sacrum, .3X.1X.1cm, pink and moist, surrounded by pink dry scar tissue which has healed. Pressure Injury POA: Yes Dressing procedure/placement/frequency: Pt has limited mobility related to amputations, air mattress ordered to reduce pressure and foam dressing to protect from further injury. Discussed plan of care with patient and family member and they verbalize understanding. Please re-consult if further assistance is needed.  Thank-you,  Julien Girt MSN, [place], [place], [place], [place]

## 2017-05-08 ENCOUNTER — Encounter (HOSPITAL_COMMUNITY): Payer: Self-pay

## 2017-05-08 LAB — RENAL FUNCTION PANEL
ALBUMIN: 2.6 g/dL — AB (ref 3.5–5.0)
Anion gap: 13 (ref 5–15)
BUN: 37 mg/dL — AB (ref 6–20)
CHLORIDE: 100 mmol/L — AB (ref 101–111)
CO2: 24 mmol/L (ref 22–32)
CREATININE: 8.23 mg/dL — AB (ref 0.61–1.24)
Calcium: 8.4 mg/dL — ABNORMAL LOW (ref 8.9–10.3)
GFR calc Af Amer: 7 mL/min — ABNORMAL LOW (ref >60)
GFR, EST NON AFRICAN AMERICAN: 6 mL/min — AB (ref >60)
GLUCOSE: 138 mg/dL — AB (ref 65–99)
POTASSIUM: 3.9 mmol/L (ref 3.5–5.1)
Phosphorus: 4 mg/dL (ref 2.5–4.6)
Sodium: 137 mmol/L (ref 135–145)

## 2017-05-08 LAB — CBC
HCT: 25.5 % — ABNORMAL LOW (ref 39.0–52.0)
HEMOGLOBIN: 7.7 g/dL — AB (ref 13.0–17.0)
MCH: 27.1 pg (ref 26.0–34.0)
MCHC: 30.2 g/dL (ref 30.0–36.0)
MCV: 89.8 fL (ref 78.0–100.0)
Platelets: 226 10*3/uL (ref 150–400)
RBC: 2.84 MIL/uL — ABNORMAL LOW (ref 4.22–5.81)
RDW: 18.2 % — ABNORMAL HIGH (ref 11.5–15.5)
WBC: 4.9 10*3/uL (ref 4.0–10.5)

## 2017-05-08 LAB — GLUCOSE, CAPILLARY
GLUCOSE-CAPILLARY: 159 mg/dL — AB (ref 65–99)
GLUCOSE-CAPILLARY: 145 mg/dL — AB (ref 65–99)
Glucose-Capillary: 128 mg/dL — ABNORMAL HIGH (ref 65–99)

## 2017-05-08 LAB — PROTIME-INR
INR: 2.44
PROTHROMBIN TIME: 26.3 s — AB (ref 11.4–15.2)

## 2017-05-08 LAB — MAGNESIUM: MAGNESIUM: 2 mg/dL (ref 1.7–2.4)

## 2017-05-08 MED ORDER — SENNOSIDES-DOCUSATE SODIUM 8.6-50 MG PO TABS
1.0000 | ORAL_TABLET | Freq: Two times a day (BID) | ORAL | Status: DC
  Administered 2017-05-08 – 2017-05-09 (×2): 1 via ORAL
  Filled 2017-05-08 (×2): qty 1

## 2017-05-08 MED ORDER — LIDOCAINE-PRILOCAINE 2.5-2.5 % EX CREA: 1.0000 "application " | TOPICAL_CREAM | CUTANEOUS | Status: DC | PRN

## 2017-05-08 MED ORDER — OXYCODONE-ACETAMINOPHEN 5-325 MG PO TABS
ORAL_TABLET | ORAL | Status: AC
  Administered 2017-05-08: 1 via ORAL
  Filled 2017-05-08: qty 1

## 2017-05-08 MED ORDER — HEPARIN SODIUM (PORCINE) 1000 UNIT/ML DIALYSIS: 1000.0000 [IU] | INTRAMUSCULAR | Status: DC | PRN

## 2017-05-08 MED ORDER — HEPARIN SODIUM (PORCINE) 1000 UNIT/ML DIALYSIS: 20.0000 [IU]/kg | INTRAMUSCULAR | Status: DC | PRN

## 2017-05-08 MED ORDER — SORBITOL 70 % SOLN
30.0000 mL | Freq: Two times a day (BID) | Status: DC
  Administered 2017-05-08 – 2017-05-09 (×2): 30 mL via ORAL
  Filled 2017-05-08 (×3): qty 30

## 2017-05-08 MED ORDER — SODIUM CHLORIDE 0.9 % IV SOLN: 100.0000 mL | INTRAVENOUS | Status: DC | PRN

## 2017-05-08 MED ORDER — ALTEPLASE 2 MG IJ SOLR: 2.0000 mg | Freq: Once | INTRAMUSCULAR | Status: DC | PRN

## 2017-05-08 MED ORDER — WARFARIN SODIUM 2.5 MG PO TABS
2.5000 mg | ORAL_TABLET | Freq: Once | ORAL | Status: AC
  Administered 2017-05-08: 2.5 mg via ORAL
  Filled 2017-05-08: qty 1

## 2017-05-08 MED ORDER — PENTAFLUOROPROP-TETRAFLUOROETH EX AERO: 1.0000 "application " | INHALATION_SPRAY | CUTANEOUS | Status: DC | PRN

## 2017-05-08 MED ORDER — VANCOMYCIN HCL IN DEXTROSE 1-5 GM/200ML-% IV SOLN
INTRAVENOUS | Status: AC
  Administered 2017-05-08: 1000 mg via INTRAVENOUS
  Filled 2017-05-08: qty 200

## 2017-05-08 MED ORDER — LIDOCAINE HCL (PF) 1 % IJ SOLN: 5.0000 mL | INTRAMUSCULAR | Status: DC | PRN

## 2017-05-08 NOTE — Progress Notes (Addendum)
Assumed care from off going RN; @0810  patient alert & oriented; Ate breakfast; Denies pain; @0815  patient went down for Dialysis; wife went home for a little bit ;   @1400  patient returned back to floor; vitals within normal range; patient ate lunch; patient alert ; bed in lowest position  @1710  patient had some c/o's pain in left BKA; oxycodone given 1 tab; wife back in room with patient;  @1915  report given to coming RN

## 2017-05-08 NOTE — Progress Notes (Signed)
[facility] Progress Note  Subjective: seems a bit lethargic/ maybe confused, not providing much details in answering   Vitals:   05/07/17 2000 05/07/17 2347 05/08/17 0417 05/08/17 0800  BP: 96/68 102/67 109/69 121/78  Pulse:  76 71 70  Resp: 16 15 15 13   Temp: 98.3 F (36.8 C) 99 F (37.2 C) 98.5 F (36.9 C) 98.3 F (36.8 C)  TempSrc: Oral Oral Oral Oral  SpO2: 100% 99% 99% 96%  Weight:   89.8 kg (198 lb)   Height:        Inpatient medications: . amiodarone  200 mg Oral Daily  . atorvastatin  40 mg Oral q1800  . calcium acetate  1,334 mg Oral TID WC  . cinacalcet  30 mg Oral QPM  . insulin aspart  0-5 Units Subcutaneous QHS  . insulin aspart  0-9 Units Subcutaneous TID WC  . ketorolac  1 drop Left Eye QHS  . latanoprost  1 drop Both Eyes QHS  . pantoprazole  40 mg Oral BID AC  . QUEtiapine  25 mg Oral BID  . sorbitol, milk of mag, mineral oil, glycerin (SMOG) enema  960 mL Rectal Once  . Warfarin - Pharmacist Dosing Inpatient   Does not apply q1800   . sodium chloride    . sodium chloride    . piperacillin-tazobactam (ZOSYN)  IV Stopped (05/08/17 0117)  . vancomycin     sodium chloride, sodium chloride, acetaminophen **OR** acetaminophen, alteplase, heparin, heparin, ipratropium-albuterol, lidocaine (PF), lidocaine-prilocaine, ondansetron **OR** ondansetron (ZOFRAN) IV, oxyCODONE-acetaminophen, pentafluoroprop-tetrafluoroeth, silver sulfADIAZINE  Exam: Alert, confused NO jvd Chest clear bilat RRR no mrg ABD obese soft ntnd No LE edema, bilat BKA Alert, NF, ox 3 L chest TDC, LUE AVF+bruit, +arm edema LUE  Dialysis: [facility] 3h 54min   86.5kg   2/2  Hep 2800 then 2000 -mircera 100 every 2 wks  Apr 18 H[facility] R[facility] May 18 - central venogram, no central occlusion Jul 18 - 1st stage L BVT Sept 18 - 2nd stage L BVT Apr 29 2017 - evac L arm hematoma, complicated by CHB, required PPM.    Summary:  ESRD pt, bilat amp, recently here for eva\cuation L  arm hematoma after 2nd stage BVT. Had PPM due to CHB, had brief cardiac arrest and got CPR. [place] 10/7, returned 10/11 for "SOB", sent from SNF I believe. In ED denied SOB, symptoms resolved, but ED MD noted foul-smelling sacral ulcers on turning patient.  Pt was afeb.  CT abd showed constipation.  Was given IV abx for poss infection of decub ulcers and admitted.       Impression: 1. Sacral decub - admitted for possible sacral decub infections.  1/2 blood cx's were + for GPR, no fevers, cath is clean. Would treat this as contaminant.  Agree w/ primary, would not remove cath.  Abx were started for decubiti.   2. ESRD HD today 3. Vol - is 2-3kg up today 4. L AVF - sp 2nd BVT in Sept and sp hematoma evac on 04/29/17.  Not ready to use, follow as OP 5. Bilat LE amp 6. Dispo - have d/w primary, ok for dc from renal standpoint  Plan - as above   Kelly Splinter MD [place] pager [phone]   05/08/2017, 9:20 AM    Recent Labs Lab 05/06/17 1955 05/06/17 2000 05/07/17 0355 05/08/17 0446  NA 133* 138 137 137  K 3.6 3.6 4.0 3.9  CL 98* 99* 101 100*  CO2 25  --  22 24  GLUCOSE 120* 122* 115* 138*  BUN 24* 28* 28* 37*  CREATININE 5.70* 5.80* 6.25* 8.23*  CALCIUM 8.6*  --  8.5* 8.4*  PHOS  --   --   --  4.0    Recent Labs Lab 05/06/17 1955 05/08/17 0446  AST 19  --   ALT <5*  --   ALKPHOS 56  --   BILITOT 0.7  --   PROT 7.4  --   ALBUMIN 3.1* 2.6*    Recent Labs Lab 05/06/17 1955 05/06/17 2000 05/07/17 0355 05/08/17 0446  WBC 7.2  --  9.6 4.9  NEUTROABS 4.8  --  7.5  --   HGB 7.3* 10.9* 9.2* 7.7*  HCT 23.8* 32.0* 29.7* 25.5*  MCV 89.5  --  89.2 89.8  PLT 287  --  218 226   Iron/TIBC/Ferritin/ %Sat    Component Value Date/Time   IRON 50 10/01/2016 0419   TIBC 167 (L) 10/01/2016 0419   FERRITIN 351 (H) 10/01/2016 0419   IRONPCTSAT 30 10/01/2016 0419

## 2017-05-08 NOTE — Progress Notes (Signed)
  Progress Note    05/08/2017 1:38 PM * No surgery found *  Subjective:  No new issues  Vitals:   05/08/17 1200 05/08/17 1230  BP: 136/75 107/60  Pulse: 75 82  Resp: 18 18  Temp:  97.9 F (36.6 C)  SpO2:  98%    Physical Exam: Alert today Left arm with stable swelling, palpable thrill, staples and sutures in place No warmth or erythema of left arm tdc site on left chest is without signs of infection  CBC    Component Value Date/Time   WBC 4.9 05/08/2017 0446   RBC 2.84 (L) 05/08/2017 0446   HGB 7.7 (L) 05/08/2017 0446   HCT 25.5 (L) 05/08/2017 0446   PLT 226 05/08/2017 0446   MCV 89.8 05/08/2017 0446   MCH 27.1 05/08/2017 0446   MCHC 30.2 05/08/2017 0446   RDW 18.2 (H) 05/08/2017 0446   LYMPHSABS 0.8 05/07/2017 0355   MONOABS 1.2 (H) 05/07/2017 0355   EOSABS 0.1 05/07/2017 0355   BASOSABS 0.0 05/07/2017 0355    BMET    Component Value Date/Time   NA 137 05/08/2017 0446   K 3.9 05/08/2017 0446   CL 100 (L) 05/08/2017 0446   CO2 24 05/08/2017 0446   GLUCOSE 138 (H) 05/08/2017 0446   BUN 37 (H) 05/08/2017 0446   CREATININE 8.23 (H) 05/08/2017 0446   CREATININE 8.97 (H) 10/07/2015 1500   CALCIUM 8.4 (L) 05/08/2017 0446   GFRNONAA 6 (L) 05/08/2017 0446   GFRNONAA 6 (L) 10/07/2015 1500   GFRAA 7 (L) 05/08/2017 0446   GFRAA 6 (L) 10/07/2015 1500    INR    Component Value Date/Time   INR 2.44 05/08/2017 0446     Intake/Output Summary (Last 24 hours) at 05/08/17 1338 Last data filed at 05/08/17 1230  Gross per 24 hour  Intake              240 ml  Output             2000 ml  Net            -1760 ml     Assessment:  66 y.o. male is admitted with signs of sepsis now on antibiotics, 1/2 bottles blood cultures positive bacillus species  Plan: Doubtful left arm source for infection If concerned for tdc can remove and give line holiday and place in groin. Will be several weeks/months until avf is ready for use.  Vascular following   Roniqua Kintz C.  Donzetta Matters, MD [facility] Office: [phone] Pager: [phone]  05/08/2017 1:38 PM

## 2017-05-08 NOTE — Progress Notes (Signed)
[place] for Warfarin  Indication: atrial fibrillation, stroke  Allergies  Allergen Reactions  . No Known Allergies    Vital Signs: Temp: 98.4 F (36.9 C) (10/13 0815) Temp Source: Oral (10/13 0815) BP: 114/66 (10/13 0900) Pulse Rate: 71 (10/13 0900)  Labs:  Recent Labs  05/06/17 1955 05/06/17 2000 05/07/17 0355 05/08/17 0446  HGB 7.3* 10.9* 9.2* 7.7*  HCT 23.8* 32.0* 29.7* 25.5*  PLT 287  --  218 226  LABPROT 19.7*  --  21.9* 26.3*  INR 1.68  --  1.93 2.44  CREATININE 5.70* 5.80* 6.25* 8.23*    Estimated Creatinine Clearance: 8.9 mL/min (A) (by C-G formula based on SCr of 8.23 mg/dL (H)).  Assessment: 66 y/o on warfarin for multiple indications, here with shortness of breath, INR is sub-therapeutic on admission at 1.68, now up to 2.44 after 2 slightly increased doses. Hg down 7.7, plt wnl. No bleed documented. Noted on amio (pta)  PTA warfarin dose: 3.75 mg Fridays, 2.5 mg all other days  Goal of Therapy:  INR 2-3 Monitor platelets by anticoagulation protocol: Yes   Plan:  Warfarin 2.5mg  PO x 1 Daily INR Monitor CBC, for s/sx bleeding  Elicia Lamp, PharmD, BCPS Clinical Pharmacist Rx Phone # for today: [phone] After 3:30PM, please call Main Rx: #[id number] 05/08/2017 11:22 AM

## 2017-05-08 NOTE — Progress Notes (Signed)
PROGRESS NOTE    WENZEL BACKLUND  YTK:[id number] DOB: [birth year] DOA: 05/06/2017 PCP: [name], MD   Specialists:     Brief Narrative:   66 year old male ESRD TTS-[facility] Diastolic heart failure EF 55-60% Atrial fibrillation chronic, [place] score >3 on warfarin Diabetes mellitus with nephropathy and neuropathy and bilateral BKA   Admitted 04/29/2017 with third degree AV block by critical care--which developed perioperatively following revision of left arm AV fistula--temporary pacing wire placed at that time and patient was on ventilator  Appears to have been sent home on 10/7 return to emergency room 10/11 with agitation pain and discomfort  Patient had blood pressures in the 90s, low-grade temperatures, new sacral ulcers and was started on empiric coverage for sepsis presumed from decubiti--placed on vancomycin and Zosyn  Also found to be impacted did abdominal CT scan   Differential diagnosis for fever workup includes a recent pacemaker placement, evacuation of left arm in a coma, sacral decubiti  Wound nurse has been consulted      Assessment & Plan:   Principal Problem:   Sacral ulcer ([place]) Active Problems:   Type 2 diabetes mellitus with chronic kidney disease on chronic dialysis, with long-term current use of insulin (HCC)   Constipation   ESRD on hemodialysis (HCC)   Paroxysmal atrial fibrillation (HCC)   Heart block AV third degree (HCC)   Sepsis  Etiology not entirely clear  Continue Empiric vancomycin/Zosyn for now   Low loss air mattress, silver sulfadiazine to sacral decubiti   May need vascular input --  Still has indwelling left subclavian dialysis catheter--could be nidus for infection--asking Vascular  to see regarding the same   Will remove TDC after next dialysis and coordinate ? Femoral access--will need 48 hours line  holiday prior to replacement of TDC and cultures to signify clearance of bacteremia  Obstipation constipation  We'll place on  schedule sorbitol and ensure passage of stool  We will also place on scheduled Senokot which will help with propulsion of stool  If there is no stool output, will need another smog enema as was given in the emergency room     ESRD Metabolic bone disease  anemia of renal disease  Continue PhosLo 1.3 g 3 times a day  Nephrology aware of need for regular dialysis and has been consulted  Third degree heart block with [employer] pacemaker placed 05/01/17 Atrial fibrillation [place] score >3 on Coumadin, amiodarone  Monitor on telemetry and stepdown unit for now  Magnesium on last check in 10/13 was 2.0-no further check at this time  INRsubtherapeutic on admission at 1.6 -->2.4  Diabetes mellitus with nephropathy, neuropathy, BKA bilaterally  Not on meds, last A1c 7  Continue sliding scale coverage [phone]   Anticoagulated on Coumadin Full CODE STATUS Inpatient on step down Discussed with wife on telephone --- Probable discharge to home tomorrow if all stable off antibiotics and no fever no chills   Consultants:   Vascular surgery  Nephrology  Infectious disease telephone consulted  Procedures:     Antimicrobials:   Vancomycin/Zosyn 10/11    Subjective:  No new issue Looks a lot more awake alert without distress Eating and drinking Back from dialysis  Objective: Vitals:   05/08/17 1100 05/08/17 1130 05/08/17 1200 05/08/17 1230  BP: 116/76 98/62 136/75 107/60  Pulse: 76 76 75 82  Resp: 18 17 18 18   Temp:    97.9 F (36.6 C)  TempSrc:    Oral  SpO2:  96%  98%  Weight:    87.9 kg (193 lb 12.6 oz)  Height:        Intake/Output Summary (Last 24 hours) at 05/08/17 1322 Last data filed at 05/08/17 1230  Gross per 24 hour  Intake              240 ml  Output             2000 ml  Net            -1760 ml   Filed Weights   05/08/17 0417 05/08/17 0815 05/08/17 1230  Weight: 89.8 kg (198 lb) 89.8 kg (197 lb 15.6 oz) 87.9 kg (193 lb 12.6 oz)    Examination:  eomi  ncat dishevelled cta b abd soft nt nd no rebound no gaurding Neuro is intact [facility] doesn't seem purulent nor does PPM  Data Reviewed: I have personally reviewed following labs and imaging studies  CBC:  Recent Labs Lab 05/02/17 1153 05/06/17 1955 05/06/17 2000 05/07/17 0355 05/08/17 0446  WBC 5.9 7.2  --  9.6 4.9  NEUTROABS  --  4.8  --  7.5  --   HGB 8.9* 7.3* 10.9* 9.2* 7.7*  HCT 28.6* 23.8* 32.0* 29.7* 25.5*  MCV 88.0 89.5  --  89.2 89.8  PLT 229 287  --  218 272   Basic Metabolic Panel:  Recent Labs Lab 05/06/17 1955 05/06/17 2000 05/07/17 0355 05/08/17 0446  NA 133* 138 137 137  K 3.6 3.6 4.0 3.9  CL 98* 99* 101 100*  CO2 25  --  22 24  GLUCOSE 120* 122* 115* 138*  BUN 24* 28* 28* 37*  CREATININE 5.70* 5.80* 6.25* 8.23*  CALCIUM 8.6*  --  8.5* 8.4*  MG  --   --   --  2.0  PHOS  --   --   --  4.0   GFR: Estimated Creatinine Clearance: 8.8 mL/min (A) (by C-G formula based on SCr of 8.23 mg/dL (H)). Liver Function Tests:  Recent Labs Lab 05/06/17 1955 05/08/17 0446  AST 19  --   ALT <5*  --   ALKPHOS 56  --   BILITOT 0.7  --   PROT 7.4  --   ALBUMIN 3.1* 2.6*   No results for input(s): LIPASE, AMYLASE in the last 168 hours. No results for input(s): AMMONIA in the last 168 hours. Coagulation Profile:  Recent Labs Lab 05/02/17 0515 05/06/17 1955 05/07/17 0355 05/08/17 0446  INR 1.79 1.68 1.93 2.44   Cardiac Enzymes: No results for input(s): CKTOTAL, CKMB, CKMBINDEX, TROPONINI in the last 168 hours. BNP (last 3 results) No results for input(s): PROBNP in the last 8760 hours. HbA1C: No results for input(s): HGBA1C in the last 72 hours. CBG:  Recent Labs Lab 05/02/17 1157 05/07/17 1102 05/07/17 1639 05/07/17 2059 05/08/17 0610  GLUCAP 167* 86 160* 183* 128*   Lipid Profile: No results for input(s): CHOL, HDL, LDLCALC, TRIG, CHOLHDL, LDLDIRECT in the last 72 hours. Thyroid Function Tests: No results for input(s): TSH, T4TOTAL,  FREET4, T3FREE, THYROIDAB in the last 72 hours. Anemia Panel: No results for input(s): VITAMINB12, FOLATE, FERRITIN, TIBC, IRON, RETICCTPCT in the last 72 hours. Urine analysis:    Component Value Date/Time   COLORURINE YELLOW 05/15/2013 0007   APPEARANCEUR CLEAR 05/15/2013 0007   LABSPEC 1.014 05/15/2013 0007   PHURINE 8.5 (H) 05/15/2013 0007   GLUCOSEU 250 (A) 05/15/2013 0007   GLUCOSEU 500 (?) 11/06/2009 0948   HGBUR NEGATIVE 05/15/2013 0007  BILIRUBINUR NEGATIVE 05/15/2013 0007   KETONESUR NEGATIVE 05/15/2013 0007   PROTEINUR >300 (A) 05/15/2013 0007   UROBILINOGEN 0.2 05/15/2013 0007   NITRITE NEGATIVE 05/15/2013 0007   LEUKOCYTESUR NEGATIVE 05/15/2013 0007     Radiology Studies: Reviewed images personally in health database    Scheduled Meds: . amiodarone  200 mg Oral Daily  . atorvastatin  40 mg Oral q1800  . calcium acetate  1,334 mg Oral TID WC  . cinacalcet  30 mg Oral QPM  . insulin aspart  0-5 Units Subcutaneous QHS  . insulin aspart  0-9 Units Subcutaneous TID WC  . ketorolac  1 drop Left Eye QHS  . latanoprost  1 drop Both Eyes QHS  . pantoprazole  40 mg Oral BID AC  . QUEtiapine  25 mg Oral BID  . senna-docusate  1 tablet Oral BID  . sorbitol  30 mL Oral BID  . sorbitol, milk of mag, mineral oil, glycerin (SMOG) enema  960 mL Rectal Once  . warfarin  2.5 mg Oral ONCE-1800  . Warfarin - Pharmacist Dosing Inpatient   Does not apply q1800   Continuous Infusions: . sodium chloride    . sodium chloride    . piperacillin-tazobactam (ZOSYN)  IV Stopped (05/08/17 0117)  . vancomycin 1,000 mg (05/08/17 1136)     LOS: 1 day    Time spent: [place], MD [facility]   If 7PM-7AM, please contact night-coverage [email or web address] Password [email or web address] 05/08/2017, 1:22 PM

## 2017-05-09 LAB — CULTURE, BLOOD (ROUTINE X 2): Special Requests: ADEQUATE

## 2017-05-09 LAB — RENAL FUNCTION PANEL
Albumin: 2.5 g/dL — ABNORMAL LOW (ref 3.5–5.0)
Anion gap: 9 (ref 5–15)
BUN: 19 mg/dL (ref 6–20)
CALCIUM: 7.9 mg/dL — AB (ref 8.9–10.3)
CHLORIDE: 99 mmol/L — AB (ref 101–111)
CO2: 25 mmol/L (ref 22–32)
CREATININE: 5.4 mg/dL — AB (ref 0.61–1.24)
GFR calc Af Amer: 12 mL/min — ABNORMAL LOW (ref >60)
GFR, EST NON AFRICAN AMERICAN: 10 mL/min — AB (ref >60)
Glucose, Bld: 120 mg/dL — ABNORMAL HIGH (ref 65–99)
Phosphorus: 3.2 mg/dL (ref 2.5–4.6)
Potassium: 3.3 mmol/L — ABNORMAL LOW (ref 3.5–5.1)
SODIUM: 133 mmol/L — AB (ref 135–145)

## 2017-05-09 LAB — PROTIME-INR
INR: 2.38
Prothrombin Time: 25.8 seconds — ABNORMAL HIGH (ref 11.4–15.2)

## 2017-05-09 LAB — CBC WITH DIFFERENTIAL/PLATELET
BASOS PCT: 0 %
Basophils Absolute: 0 10*3/uL (ref 0.0–0.1)
Eosinophils Absolute: 0.1 10*3/uL (ref 0.0–0.7)
Eosinophils Relative: 2 %
HEMATOCRIT: 27.3 % — AB (ref 39.0–52.0)
HEMOGLOBIN: 8 g/dL — AB (ref 13.0–17.0)
LYMPHS ABS: 0.9 10*3/uL (ref 0.7–4.0)
LYMPHS PCT: 20 %
MCH: 26.7 pg (ref 26.0–34.0)
MCHC: 29.3 g/dL — AB (ref 30.0–36.0)
MCV: 91 fL (ref 78.0–100.0)
MONOS PCT: 20 %
Monocytes Absolute: 0.9 10*3/uL (ref 0.1–1.0)
NEUTROS ABS: 2.7 10*3/uL (ref 1.7–7.7)
NEUTROS PCT: 58 %
PLATELETS: 209 10*3/uL (ref 150–400)
RBC: 3 MIL/uL — ABNORMAL LOW (ref 4.22–5.81)
RDW: 18.3 % — AB (ref 11.5–15.5)
WBC: 4.6 10*3/uL (ref 4.0–10.5)

## 2017-05-09 LAB — GLUCOSE, CAPILLARY
GLUCOSE-CAPILLARY: 107 mg/dL — AB (ref 65–99)
GLUCOSE-CAPILLARY: 144 mg/dL — AB (ref 65–99)

## 2017-05-09 MED ORDER — QUETIAPINE FUMARATE 25 MG PO TABS: 12.5000 mg | ORAL_TABLET | Freq: Every day | ORAL | 8 refills | Status: DC

## 2017-05-09 MED ORDER — FLEET ENEMA 7-19 GM/118ML RE ENEM: 1.0000 | ENEMA | Freq: Every day | RECTAL | 0 refills | Status: DC | PRN

## 2017-05-09 MED ORDER — FLEET ENEMA 7-19 GM/118ML RE ENEM
1.0000 | ENEMA | Freq: Once | RECTAL | Status: AC
  Administered 2017-05-09: 1 via RECTAL
  Filled 2017-05-09: qty 1

## 2017-05-09 MED ORDER — SORBITOL 70 % SOLN: 30.0000 mL | Freq: Two times a day (BID) | 0 refills | Status: DC

## 2017-05-09 MED ORDER — DARBEPOETIN ALFA 100 MCG/0.5ML IJ SOSY: 100.0000 ug | PREFILLED_SYRINGE | INTRAMUSCULAR | Status: DC

## 2017-05-09 MED ORDER — WARFARIN SODIUM 2.5 MG PO TABS
2.5000 mg | ORAL_TABLET | Freq: Once | ORAL | Status: AC
  Administered 2017-05-09: 2.5 mg via ORAL
  Filled 2017-05-09: qty 1

## 2017-05-09 MED ORDER — SENNA 8.6 MG PO TABS: 1.0000 | ORAL_TABLET | Freq: Every day | ORAL | 0 refills | Status: DC

## 2017-05-09 NOTE — Progress Notes (Addendum)
[facility] Progress Note  Subjective: still lethargic  Vitals:   05/08/17 2000 05/09/17 0451 05/09/17 0825 05/09/17 1100  BP: 105/71 103/66 112/77   Pulse: 79 65 66   Resp: 15 11 15    Temp: 98.3 F (36.8 C) 98.6 F (37 C) 98.2 F (36.8 C) 98.7 F (37.1 C)  TempSrc: Oral Oral Oral Oral  SpO2: 97% 99% 100%   Weight:      Height:        Inpatient medications: . amiodarone  200 mg Oral Daily  . atorvastatin  40 mg Oral q1800  . calcium acetate  1,334 mg Oral TID WC  . cinacalcet  30 mg Oral QPM  . insulin aspart  0-5 Units Subcutaneous QHS  . insulin aspart  0-9 Units Subcutaneous TID WC  . ketorolac  1 drop Left Eye QHS  . latanoprost  1 drop Both Eyes QHS  . pantoprazole  40 mg Oral BID AC  . QUEtiapine  25 mg Oral BID  . senna-docusate  1 tablet Oral BID  . sorbitol  30 mL Oral BID  . sorbitol, milk of mag, mineral oil, glycerin (SMOG) enema  960 mL Rectal Once  . warfarin  2.5 mg Oral ONCE-1800  . Warfarin - Pharmacist Dosing Inpatient   Does not apply q1800   . sodium chloride    . sodium chloride     sodium chloride, sodium chloride, acetaminophen **OR** acetaminophen, alteplase, heparin, heparin, ipratropium-albuterol, lidocaine (PF), lidocaine-prilocaine, ondansetron **OR** ondansetron (ZOFRAN) IV, oxyCODONE-acetaminophen, pentafluoroprop-tetrafluoroeth, silver sulfADIAZINE  Exam: Alert, oriented but lethargic NO jvd Chest clear bilat RRR no mrg ABD obese soft ntnd No LE edema, bilat BKA Alert, NF, ox 3 L chest TDC, LUE AVF+bruit, +arm edema LUE  Dialysis: [facility] 3h 89min   86.5kg   2/2  Hep 2800 then 2000 -mircera 100 every 2 wks  Apr 18 H[facility] [place] [facility] May 18 - central venogram, no central occlusion Jul 18 - 1st stage L BVT Sept 18 - 2nd stage L BVT Apr 29 2017 - evac L arm hematoma, complicated by CHB, required PPM.    Summary:  ESRD pt, bilat amp, recently here for eva\cuation L arm hematoma after 2nd stage BVT. Had PPM due to CHB,  had brief cardiac arrest and got CPR. [place] 10/7, returned 10/11 for "SOB", sent from SNF I believe. In ED denied SOB, symptoms resolved, but ED MD noted foul-smelling sacral ulcers on turning patient.  Pt was afeb.  CT abd showed constipation.  Was given IV abx for poss infection of decub ulcers and admitted.       Impression: 1. Sacral decub - admitted for possible sacral decub infections.  1/2 blood cx's were + for GPR, suspect contaminant.  Leaving HD cath in place.  2. Lethargy - meds, vs other  3. ESRD HD TTS 4. Vol - up only 1 kg 5. L AVF - sp 2nd stage BVT in Sept and sp hematoma evac on 04/29/17.  Not ready to use, follow as OP 6. Bilat LE amp 7. PVD/ bilat BKA 8. MBD - cont binders/ sensipar 9. Anemia of CKD - cont esa, give darbe 100 ug next HD. Hb 8-9 10. DM on insulin 11. Afib - on amio, coumadin  Plan - as above   Kelly Splinter MD [facility] pager [phone]   05/09/2017, 11:48 AM    Recent Labs Lab 05/07/17 0355 05/08/17 0446 05/09/17 0238  NA 137 137 133*  K 4.0  3.9 3.3*  CL 101 100* 99*  CO2 22 24 25   GLUCOSE 115* 138* 120*  BUN 28* 37* 19  CREATININE 6.25* 8.23* 5.40*  CALCIUM 8.5* 8.4* 7.9*  PHOS  --  4.0 3.2    Recent Labs Lab 05/06/17 1955 05/08/17 0446 05/09/17 0238  AST 19  --   --   ALT <5*  --   --   ALKPHOS 56  --   --   BILITOT 0.7  --   --   PROT 7.4  --   --   ALBUMIN 3.1* 2.6* 2.5*    Recent Labs Lab 05/06/17 1955  05/07/17 0355 05/08/17 0446 05/09/17 0238  WBC 7.2  --  9.6 4.9 4.6  NEUTROABS 4.8  --  7.5  --  2.7  HGB 7.3*  < > 9.2* 7.7* 8.0*  HCT 23.8*  < > 29.7* 25.5* 27.3*  MCV 89.5  --  89.2 89.8 91.0  PLT 287  --  218 226 209  < > = values in this interval not displayed. Iron/TIBC/Ferritin/ %Sat    Component Value Date/Time   IRON 50 10/01/2016 0419   TIBC 167 (L) 10/01/2016 0419   FERRITIN 351 (H) 10/01/2016 0419   IRONPCTSAT 30 10/01/2016 0419

## 2017-05-09 NOTE — Discharge Summary (Signed)
Physician Discharge Summary  JAIMESON GOPAL BSW:[id number] DOB: May 29, 1951 DOA: 05/06/2017  PCP: [name], MD  Admit date: 05/06/2017 Discharge date: 05/09/2017  Time spent: 45 minutes  Recommendations for Outpatient Follow-up:  1. meds discontinued this admission = oxycodone, Seroquel changed to half tablet = 12.5, started sorbitol this admission, use with senna and if no stool needs enema every 3 daily 2. Get renal panel and CBC about one week 3. Outpatient follow-up with vascular surgery 4. Resume dialysis Tuesday Thursday Saturday 5. Pacemaker device check per prior scheduling 6. Needs frequent turning every 2 hours at facility--needs low air loss mattress at facility--please have wound nurse evaluate sacral decubiti to ensure he does not worsen--dress the wounds with silver sulfadiazine daily  Discharge Diagnoses:  Principal Problem:   Sacral ulcer ([place]) Active Problems:   Type 2 diabetes mellitus with chronic kidney disease on chronic dialysis, with long-term current use of insulin (HCC)   Constipation   ESRD on hemodialysis (HCC)   Paroxysmal atrial fibrillation (HCC)   Heart block AV third degree [facility])   Discharge Condition: improved  Diet recommendation: renal  Filed Weights   05/08/17 0417 05/08/17 0815 05/08/17 1230  Weight: 89.8 kg (198 lb) 89.8 kg (197 lb 15.6 oz) 87.9 kg (193 lb 12.6 oz)    History of present illness:  66 year old male ESRD [facility] Diastolic heart failure EF 55-60% Atrial fibrillation chronic, [place] score >3 on warfarin Diabetes mellitus with nephropathy and neuropathy and bilateral BKA              Admitted 04/29/2017 with third degree AV block by critical care--which developed perioperatively following revision of left arm AV fistula--temporary pacing wire placed at that time and patient was on ventilator             Appears to have been sent home on 10/7 return to emergency room 10/11 with agitation pain and discomfort              Patient had blood pressures in the 90s, low-grade temperatures, new sacral ulcers and was started on empiric coverage for sepsis presumed from decubiti--placed on vancomycin and Zosyn             Also found to be impacted did abdominal CT scan              Differential diagnosis for fever workup includes a recent pacemaker placement, evacuation of left arm in a coma, sacral decubiti             Wound nurse has been consulted                Hospital Course:   Sepsis             Etiology not entirely clear             Continue Empiric vancomycin/Zosyn for now                         Low loss air mattress, silver sulfadiazine to sacral decubiti                         May need vascular input --             Still has indwelling left subclavian dialysis catheter  Blood cultures--instead were contaminants--Bacillus  It was felt that patient had transient SIRS and patient did not have any further  Somnolence   DDX dementia + medications  Discontinued oxycodone use Tylenol high-dose for pain  Cut back Seroquel 25-->12.5  Obstipation constipation             We'll place on schedule sorbitol-needs Senokot and this regularly  If does not pass the stool needs enemas on a every 3 daily basis                            ESRD Metabolic bone disease Anemia of renal disease             Continue PhosLo 1.3 g 3 times a day             Nephrology input appreciated  Third degree heart block with [employer] pacemaker placed 05/01/17 Atrial fibrillation [place] score >3 on Coumadin, amiodarone             Magnesium on last check in 10/13 was 2.0-no further check at this time             INRsubtherapeutic on admission at 1.6 -->2.4  Diabetes mellitus with nephropathy, neuropathy, BKA bilaterally             Not on meds, last A1c 7              Continue sliding scale coverage in hospital and discuss with outpatient care team further  coverage   Consults  Vascular  Renal  Consultations:  none  Discharge Exam: Vitals:   05/08/17 2000 05/09/17 0451  BP: 105/71 103/66  Pulse: 79 65  Resp: 15 11  Temp: 98.3 F (36.8 C) 98.6 F (37 C)  SpO2: 97% 99%   Sleepy this morning but on evaluation again this afternoon after lunch is more awake alert can tell me his name can tell me his wife's name can tell me the president can tell me the place Tolerated about 40% of lunch S1-S2 no murmur pacemaker site looks clean on the right side, temporary dialysis access on the left site looks clean Left arm fistula graft looks very clean without issues although areas still swollen Abdomen is soft nontender no rebound Much more alert than previous  Discharge Instructions   Discharge Instructions    Diet - low sodium heart healthy    Complete by:  As directed    Discharge instructions    Complete by:  As directed    Please see changes to meds---have started Sorbitol, to help with stools--Use this and senna to stay regular We will get an Aide to come out and see you   Increase activity slowly    Complete by:  As directed      Current Discharge Medication List    START taking these medications   Details  senna (SENOKOT) 8.6 MG TABS tablet Take 1 tablet (8.6 mg total) by mouth daily. Qty: 120 each, Refills: 0    sodium phosphate (FLEET) 7-19 GM/118ML ENEM Place 133 mLs (1 enema total) rectally daily as needed for severe constipation. Qty: 30 enema, Refills: 0    sorbitol 70 % SOLN Take 30 mLs by mouth 2 (two) times daily. Qty: 473 mL, Refills: 0      CONTINUE these medications which have CHANGED   Details  QUEtiapine (SEROQUEL) 25 MG tablet Take 0.5 tablets (12.5 mg total) by mouth at bedtime. Qty: 60 tablet, Refills: 8      CONTINUE these medications which have NOT CHANGED   Details  acetaminophen (TYLENOL)  500 MG tablet Take 500 mg by mouth every 6 (six) hours as needed for mild pain.    amiodarone  (PACERONE) 200 MG tablet TAKE 1 TABLET BY MOUTH DAILY, START THIS ON 11/06/16 Qty: 90 tablet, Refills: 3    atorvastatin (LIPITOR) 40 MG tablet TAKE 1 TABLET(40 MG) BY MOUTH DAILY AT 6 PM Qty: 30 tablet, Refills: 4    Bromfenac Sodium (PROLENSA) 0.07 % SOLN Place 1 drop into the left eye at bedtime.    calcium acetate (PHOSLO) 667 MG capsule Take 1,334 mg by mouth 3 (three) times daily with meals.     latanoprost (XALATAN) 0.005 % ophthalmic solution Place 1 drop into both eyes at bedtime.     pantoprazole (PROTONIX) 40 MG tablet Take 1 tablet (40 mg total) by mouth 2 (two) times daily before a meal. Qty: 60 tablet, Refills: 11    SENSIPAR 30 MG tablet Take 30 mg by mouth every evening.     !! silver sulfADIAZINE (SILVADENE) 1 % cream Apply 1 application topically daily as needed (irratation). Qty: 50 g    traMADol (ULTRAM) 50 MG tablet Take 50 mg by mouth every 6 (six) hours as needed for moderate pain.    warfarin (COUMADIN) 2.5 MG tablet Take 1 tablet daily except 1.5 tablets on Wed and Fri or as directed by Anticoagulation Clinic Qty: 40 tablet, Refills: 3    !! SSD 1 % cream APPLY EXTERNALLY TO THE AFFECTED AREA DAILY Qty: 50 g, Refills: 0     !! - Potential duplicate medications found. Please discuss with provider.    STOP taking these medications     oxyCODONE-acetaminophen (ROXICET) 5-325 MG tablet        Allergies  Allergen Reactions  . No Known Allergies       The results of significant diagnostics from this hospitalization (including imaging, microbiology, ancillary and laboratory) are listed below for reference.    Significant Diagnostic Studies: Dg Chest Port 1 View  Result Date: 05/06/2017 CLINICAL DATA:  Initial evaluation for acute shortness of breath. EXAM: PORTABLE CHEST 1 VIEW COMPARISON:  Prior radiograph from 05/01/2017. FINDINGS: Right-sided pacemaker/AICD in place. Left-sided dual lumen hemodialysis catheter in stable position. Stable  cardiomegaly. Mediastinal silhouette normal. Lungs hypoinflated. Mild right basilar atelectasis and/ or scarring. No focal infiltrates. No pulmonary edema or definite pleural effusion. No pneumothorax. No acute osseus abnormality. Skin staples present at the medial left upper extremity, suspected to be related to AV fistula. IMPRESSION: 1. Shallow lung inflation with mild right basilar atelectasis and/or scarring. 2. No other active cardiopulmonary disease. 3. Stable cardiomegaly without pulmonary edema. Electronically Signed   By: Jeannine Boga M.D.   On: 05/06/2017 22:24   Dg Chest Port 1 View  Result Date: 05/01/2017 CLINICAL DATA:  Pacemaker placement. EXAM: PORTABLE CHEST 1 VIEW COMPARISON:  Chest x-ray dated April 29, 2017. FINDINGS: Interval placement of a dual lead right chest wall AICD with leads terminating in the right atrium and right ventricle. Unchanged left internal jugular tunnel dialysis catheter with tip in the right atrium. Interval removal of endotracheal tube. The cardiomediastinal silhouette is normal in size. Normal pulmonary vascularity. Bibasilar atelectasis. No focal consolidation, pleural effusion, or pneumothorax. No acute osseous abnormality. IMPRESSION: Right chest wall AICD placement.  No pneumothorax. Electronically Signed   By: Titus Dubin M.D.   On: 05/01/2017 08:50   Dg Chest Port 1 View  Result Date: 04/29/2017 CLINICAL DATA:  Status post intubation. EXAM: PORTABLE CHEST 1 VIEW COMPARISON:  11/13/2016 FINDINGS: Left subclavian dialysis catheter is noted with tip in the low right atrium. Status post ETT tip. The tip of the ET tube appears to be 2.4 cm above the carina. Normal heart size. There is no pleural effusion or edema. No airspace opacities. IMPRESSION: Tip of the ET tube is 2.4 cm above the carina. Electronically Signed   By: Kerby Moors M.D.   On: 04/29/2017 20:23   Ct Renal Stone Study  Result Date: 05/06/2017 CLINICAL DATA:  Initial evaluation  for acute pain in sacral region. EXAM: CT ABDOMEN AND PELVIS WITHOUT CONTRAST TECHNIQUE: Multidetector CT imaging of the abdomen and pelvis was performed following the standard protocol without IV contrast. COMPARISON:  None available. FINDINGS: Lower chest: Bibasilar atelectasis and/ or scarring noted within the visualized lung bases. Cardiac pacemaker electrodes partially visualized. Visualized lungs otherwise clear. Hepatobiliary: Subcentimeter metallic density present within the inferior right hepatic lobe. Liver otherwise demonstrates a normal unenhanced appearance. Subtle hyperdensity within the gallbladder lumen suggestive of cholelithiasis. No imaging findings to suggest acute cholecystitis. No biliary dilatation. Pancreas: Pancreas within normal limits. Spleen: Spleen within normal limits. Adrenals/Urinary Tract: Adrenal glands are normal. Kidneys somewhat atrophic bilaterally. No nephrolithiasis or hydronephrosis. Subcentimeter hypodensity at the inferior pole left kidney too small the characterize, but statistically likely reflects a small cyst. No radiopaque calculi seen along course of either renal collecting system. No hydroureter. Urinary bladder is displaced anteriorly by prominent stool within the rectal vault. Mild circumferential bladder wall thickening like related incomplete distension. No layering stones within the bladder lumen. Stomach/Bowel: Stomach within normal limits. No evidence for bowel obstruction. Large volume stool seen impacted within the rectal vault. Associated circumferential wall thickening. No other acute inflammatory changes seen about the bowels. Vascular/Lymphatic: Advanced aorto bi-iliac atherosclerotic disease. No aneurysm. No adenopathy. Reproductive: Prostate appears to be absent. Other: No free air or fluid. Musculoskeletal: No acute osseus abnormality. No worrisome lytic or blastic osseous lesions. Advanced degenerative spondylolysis at L5-S1. Prominent facet  arthropathy present within the lower lumbar spine. Multiple prominent degenerative endplate Schmorl's nodes noted. IMPRESSION: 1. Large volume stool impacted within the distal colon/rectal vault, consistent with constipation. 2. No other acute intra-abdominal or pelvic process. 3. No CT evidence for nephrolithiasis or obstructive uropathy. 4. Cholelithiasis. 5. Advanced aorto bi-iliac atherosclerotic disease. Electronically Signed   By: Jeannine Boga M.D.   On: 05/06/2017 22:22    Microbiology: Recent Results (from the past 240 hour(s))  MRSA PCR Screening     Status: None   Collection Time: 04/29/17  8:01 PM  Result Value Ref Range Status   MRSA by PCR NEGATIVE NEGATIVE Final    Comment:        The GeneXpert MRSA Assay (FDA approved for NASAL specimens only), is one component of a comprehensive MRSA colonization surveillance program. It is not intended to diagnose MRSA infection nor to guide or monitor treatment for MRSA infections.   Surgical pcr screen     Status: None   Collection Time: 04/30/17  5:33 AM  Result Value Ref Range Status   MRSA, PCR NEGATIVE NEGATIVE Final   Staphylococcus aureus NEGATIVE NEGATIVE Final    Comment: (NOTE) The Xpert SA Assay (FDA approved for NASAL specimens in patients 17 years of age and older), is one component of a comprehensive surveillance program. It is not intended to diagnose infection nor to guide or monitor treatment.   Surgical PCR screen     Status: None   Collection Time: 04/30/17 10:09 AM  Result Value Ref Range Status   MRSA, PCR NEGATIVE NEGATIVE Final   Staphylococcus aureus NEGATIVE NEGATIVE Final    Comment: (NOTE) The Xpert SA Assay (FDA approved for NASAL specimens in patients 42 years of age and older), is one component of a comprehensive surveillance program. It is not intended to diagnose infection nor to guide or monitor treatment.   Blood culture (routine x 2)     Status: None (Preliminary result)    Collection Time: 05/06/17  7:55 PM  Result Value Ref Range Status   Specimen Description BLOOD LEFT ANTECUBITAL  Final   Special Requests   Final    BOTTLES DRAWN AEROBIC AND ANAEROBIC Blood Culture adequate volume   Culture NO GROWTH 2 DAYS  Final   Report Status PENDING  Incomplete  Blood culture (routine x 2)     Status: Abnormal   Collection Time: 05/06/17 10:15 PM  Result Value Ref Range Status   Specimen Description BLOOD RIGHT ANTECUBITAL  Final   Special Requests   Final    BOTTLES DRAWN AEROBIC AND ANAEROBIC Blood Culture adequate volume   Culture  Setup Time   Final    GRAM POSITIVE RODS ANAEROBIC BOTTLE ONLY Organism ID to follow CRITICAL RESULT CALLED TO, READ BACK BY AND VERIFIED WITH: Hughie Closs Pharm.D. 13:30 05/07/17 ([email or web address])    Culture (A)  Final    BACILLUS SPECIES Standardized susceptibility testing for this organism is not available.    Report Status 05/09/2017 FINAL  Final  Blood Culture ID Panel (Reflexed)     Status: None   Collection Time: 05/06/17 10:15 PM  Result Value Ref Range Status   Enterococcus species NOT DETECTED NOT DETECTED Final   Listeria monocytogenes NOT DETECTED NOT DETECTED Final   Staphylococcus species NOT DETECTED NOT DETECTED Final   Staphylococcus aureus NOT DETECTED NOT DETECTED Final   Streptococcus species NOT DETECTED NOT DETECTED Final   Streptococcus agalactiae NOT DETECTED NOT DETECTED Final   Streptococcus pneumoniae NOT DETECTED NOT DETECTED Final   Streptococcus pyogenes NOT DETECTED NOT DETECTED Final   Acinetobacter baumannii NOT DETECTED NOT DETECTED Final   Enterobacteriaceae species NOT DETECTED NOT DETECTED Final   Enterobacter cloacae complex NOT DETECTED NOT DETECTED Final   Escherichia coli NOT DETECTED NOT DETECTED Final   Klebsiella oxytoca NOT DETECTED NOT DETECTED Final   Klebsiella pneumoniae NOT DETECTED NOT DETECTED Final   Proteus species NOT DETECTED NOT DETECTED Final   Serratia marcescens NOT  DETECTED NOT DETECTED Final   Haemophilus influenzae NOT DETECTED NOT DETECTED Final   Neisseria meningitidis NOT DETECTED NOT DETECTED Final   Pseudomonas aeruginosa NOT DETECTED NOT DETECTED Final   Candida albicans NOT DETECTED NOT DETECTED Final   Candida glabrata NOT DETECTED NOT DETECTED Final   Candida krusei NOT DETECTED NOT DETECTED Final   Candida parapsilosis NOT DETECTED NOT DETECTED Final   Candida tropicalis NOT DETECTED NOT DETECTED Final     Labs: Basic Metabolic Panel:  Recent Labs Lab 05/06/17 1955 05/06/17 2000 05/07/17 0355 05/08/17 0446 05/09/17 0238  NA 133* 138 137 137 133*  K 3.6 3.6 4.0 3.9 3.3*  CL 98* 99* 101 100* 99*  CO2 25  --  22 24 25   GLUCOSE 120* 122* 115* 138* 120*  BUN 24* 28* 28* 37* 19  CREATININE 5.70* 5.80* 6.25* 8.23* 5.40*  CALCIUM 8.6*  --  8.5* 8.4* 7.9*  MG  --   --   --  2.0  --   PHOS  --   --   --  4.0 3.2   Liver Function Tests:  Recent Labs Lab 05/06/17 1955 05/08/17 0446 05/09/17 0238  AST 19  --   --   ALT <5*  --   --   ALKPHOS 56  --   --   BILITOT 0.7  --   --   PROT 7.4  --   --   ALBUMIN 3.1* 2.6* 2.5*   No results for input(s): LIPASE, AMYLASE in the last 168 hours. No results for input(s): AMMONIA in the last 168 hours. CBC:  Recent Labs Lab 05/02/17 1153 05/06/17 1955 05/06/17 2000 05/07/17 0355 05/08/17 0446 05/09/17 0238  WBC 5.9 7.2  --  9.6 4.9 4.6  NEUTROABS  --  4.8  --  7.5  --  2.7  HGB 8.9* 7.3* 10.9* 9.2* 7.7* 8.0*  HCT 28.6* 23.8* 32.0* 29.7* 25.5* 27.3*  MCV 88.0 89.5  --  89.2 89.8 91.0  PLT 229 287  --  218 226 209   Cardiac Enzymes: No results for input(s): CKTOTAL, CKMB, CKMBINDEX, TROPONINI in the last 168 hours. BNP: BNP (last 3 results) No results for input(s): BNP in the last 8760 hours.  ProBNP (last 3 results) No results for input(s): PROBNP in the last 8760 hours.  CBG:  Recent Labs Lab 05/07/17 2059 05/08/17 0610 05/08/17 1614 05/08/17 2131  05/09/17 0626  GLUCAP 183* 128* 159* 145* 107*       Signed:  Nita Sells MD   [facility] 05/09/2017, 8:35 AM

## 2017-05-09 NOTE — Progress Notes (Signed)
[place] for Warfarin  Indication: atrial fibrillation, stroke  Allergies  Allergen Reactions  . No Known Allergies    Vital Signs: Temp: 98.2 F (36.8 C) (10/14 0825) Temp Source: Oral (10/14 0825) BP: 112/77 (10/14 0825) Pulse Rate: 66 (10/14 0825)  Labs:  Recent Labs  05/07/17 0355 05/08/17 0446 05/09/17 0238  HGB 9.2* 7.7* 8.0*  HCT 29.7* 25.5* 27.3*  PLT 218 226 209  LABPROT 21.9* 26.3* 25.8*  INR 1.93 2.44 2.38  CREATININE 6.25* 8.23* 5.40*    Estimated Creatinine Clearance: 13.4 mL/min (A) (by C-G formula based on SCr of 5.4 mg/dL (H)).  Assessment: 66 y/o on warfarin for multiple indications, here with shortness of breath, INR is sub-therapeutic on admission at 1.68, now stable at 2.38 and therapeutic. Cbc stable. No bleed documented. Noted on amio (pta)  PTA warfarin dose: 3.75 mg Fridays, 2.5 mg all other days  Goal of Therapy:  INR 2-3 Monitor platelets by anticoagulation protocol: Yes   Plan:  Warfarin 2.5mg  PO x 1 Daily INR Monitor CBC, for s/sx bleeding  Elicia Lamp, PharmD, BCPS Clinical Pharmacist Rx Phone # for today: [phone] After 3:30PM, please call Main Rx: #[id number] 05/09/2017 11:35 AM

## 2017-05-09 NOTE — Care Management Note (Addendum)
Case Management Note Marvetta Gibbons RN, BSN Unit 4E-Case Manager [id number]  Patient Details  Name: Oscar Castillo MRN: [id number] Date of Birth: [birth year]  Subjective/Objective:   Pt admitted with sacral ulcer pain, sepsis- hx of ESRD-                 Action/Plan: PTA pt lived at home with wife- - HD- M/W/F- per wife pt active with [facility] for HHRN/PT- pt for d/c today with orders for HHRN/PT/OT/aide- have spoken to wife at bedside who wants to continue services with [facility]- plans to transport pt home via private care- has needed DME at home including walker and w/c. Reports that home is too small for hoyer lift. - notified Jermaine with [facility] for resumption of [place] services and pt's discharge.  Expected Discharge Date:  05/09/17               Expected Discharge Plan:  [place]  In-House Referral:  NA  Discharge planning Services  CM Consult  Post Acute Care Choice:  Home Health Choice offered to:  Spouse  DME Arranged:    DME Agency:     HH Arranged:  RN, PT, OT, Nurse's Aide [place] Agency:  [place]  Status of Service:  Completed, signed off  If discussed at [place] of Stay Meetings, dates discussed:    Discharge Disposition: home/home health   Additional Comments:  Dawayne Patricia, RN 05/09/2017, 9:11 AM

## 2017-05-09 NOTE — Progress Notes (Signed)
   Left arm with stable edema does not appear infected No further fevers, blood cultures 1/2 positive for bacillus possibly contaminant Planning to keep tdc for now. Dr. Bridgett Larsson to see tomorrow if he is here or will see in office 10.26 as already scheduled for staple/suture removal.   Vandy Tsuchiya C. Donzetta Matters, MD [facility] Office: [phone] Pager: [phone]

## 2017-05-09 NOTE — Progress Notes (Signed)
Assumed care from off going RN; patient alert with some sleepiness; spouse had concerns about patient with come confusion but MD is aware and has examined patient and concerns were addressed with wife at the time he rounded this AM; patient will possibly be discharge later in shift; HOB less than 30 degrees; bed in lowest position; call in reach; wife at bedside

## 2017-05-11 DIAGNOSIS — E1122 Type 2 diabetes mellitus with diabetic chronic kidney disease: Secondary | ICD-10-CM | POA: Diagnosis not present

## 2017-05-11 DIAGNOSIS — N186 End stage renal disease: Secondary | ICD-10-CM | POA: Diagnosis not present

## 2017-05-11 DIAGNOSIS — Z4802 Encounter for removal of sutures: Secondary | ICD-10-CM | POA: Diagnosis not present

## 2017-05-11 DIAGNOSIS — D631 Anemia in chronic kidney disease: Secondary | ICD-10-CM | POA: Diagnosis not present

## 2017-05-11 DIAGNOSIS — N2581 Secondary hyperparathyroidism of renal origin: Secondary | ICD-10-CM | POA: Diagnosis not present

## 2017-05-11 LAB — CULTURE, BLOOD (ROUTINE X 2)
Culture: NO GROWTH
SPECIAL REQUESTS: ADEQUATE

## 2017-05-11 NOTE — Discharge Summary (Signed)
[facility] Discharge Summary   Patient ID:  AYCE PIETRZYK MRN: [id number] DOB/AGE: 65/13/1952 65 y.o.  Admit date: 04/29/2017 Discharge date:05/02/2017 Date of Surgery: 04/29/2017 - 04/30/2017 Surgeon: Juliann Mule): Castillo Lance, MD  Admission Diagnosis: Bleeding from dialysis shunt, subsequent encounter [T82.838D] CHB (complete heart block) ([place]) [I44.2]  Discharge Diagnoses:  Bleeding from dialysis shunt, subsequent encounter [T82.838D] CHB (complete heart block) ([place]) [I44.2]  Secondary Diagnoses: Past Medical History:  Diagnosis Date  . Anemia   . Antral ulcer 2014   small  . BENIGN PROSTATIC HYPERTROPHY   . Bipolar disorder ([place])    "sometimes" (10/07/2016)  . CHOLELITHIASIS   . Chronic combined systolic and diastolic CHF (congestive heart failure) ([place])   . Complication of anesthesia    wife states pt had trouble waking up with in Nov., 2014  . CVA (cerebral vascular accident) ([place]) 07/2007  . DEPRESSION   . DIABETES MELLITUS, TYPE II    diet control  . ERECTILE DYSFUNCTION   . ESRD on hemodialysis ([place])    ESRD due to DM/HTN. Started dialysis in November 2013.  HD TTS at [facility] on [address].  [name] GERD   . GI bleed    due to gastritis, discharged 10/02/16/notes 10/07/2016  . Hepatitis C    C - has been treated  . History of Clostridium difficile   . History of kidney stones   . HYPERTENSION   . LBBB (left bundle branch block)   . Morbid obesity ([place])   . PAF (paroxysmal atrial fibrillation) ([place])    a. Dx 12/2015.    Procedure(s): PACEMAKER IMPLANT  Discharged Condition: good  HPI: Oscar Castillo is a 66 y.o. male who presents with chief complaint: bleeding from left arm.  Pt s/p transposition of L BRVT with revision of anastomosis to medial brachial vein.  Pt is on Coumadin with INR 2.5 presented earlier today with hematoma.  We discussed options for managed and patient elected conservative management.  Reportedly he went home and  started bleeding from left arm.   Hospital Course:  LYRIK Castillo is a 66 y.o. male is S/P  PROCEDURE: 1. Evacuation of left arm hematoma  Cardiology consult Dr. Dani Gobble Croitoru ERIVERTO Castillo is a 66 y.o. male with a hx of Mild nonischemic cardiomyopathy, paroxysmal atrial fibrillation, incisional disease on dialysis, who is being seen today for the evaluation of complete heart block following vascular surgery at the request of Dr. Bridgett Larsson.  Plan: 1. Complete heart block: Based on response administration of calcium/epinephrine/sodium bicarbonate suspect that there is a metabolic etiology for this abnormality, most likely hyperkalemia. However we need to provide stable ventricular rhythm until this can be corrected. Labs have been sent but are not yet available. We'll go ahead with a temporary transvenous pacing wire from the groin, Dr. Gwenlyn Found will perform.  Discussed clinical situation with Dr. Lovena Le (EP), Dr. Jonnie Finner (nephrology) and will contact PCCM service for assistance with mechanical ventilation. Currently no clear reversible cause for complete heart block has been identified. Labs are better than expected. Potassium is 5.0. At this point plan will be to go ahead with permanent pacemaker implantation via right subclavian approach tomorrow afternoon. Preferably by then the patient will have had dialysis and will be extubated. INR after FFP is 2.06. Also discussed most recent events with the patient's wife.  CCM: CHIEF COMPLAINT:  Intubated following 2 episodes of third-degree AV block and a subsequent episode of VT  POD #1 s/p  L arm hematoma evacuation complicated by complete heart block   Appreciate Cardiology's mgmt of this patient's complete heart block  Pt remains intubated to facilitate Cardiology placement of PPM  Pt's INR was >2.5 today even with 3 u FFP suggesting his coumadin loading has been overly aggressive, which likely resulted in the L arm hematoma post-op  Changed L  arm bandages as need.  Continued JP.  Expect drain will need to be in place for days.  04/30/2017 8:30 am  Patient extubated per order to 4L [place] with no complications. Cuff leak was noted prior to extubation. Vitals are stable and sats are 100%. Patient is able to speak and is oriented to time and place. RT will continue to monitor.   Oscar Castillo 04/30/2017, 8:33 AM  Consult Dr. Jonnie Finner Nephrology  Indication for Consultation:  Management of ESRD/hemodialysis; anemia, hypertension/volume and secondary hyperparathyroidism  Dr. Adora Fridge 12:29 pm Mr. Oscar Castillo presented urgently for placement of a temporary transvenous pacemaker for complete heart block. He did have bradycardia and VF requiring defibrillation followed by heart block and hypotension. He is a dialysis patient however his potassium was normal. His INR was not supratherapeutic. I anesthetized his right groin and placed a sheath in his right common femoral vein. I then floated a balloon tipped temperature is pacing lead to the RV apex and demonstrated capture at 0.4 mA. The patient tolerated procedure well without complication  27/0/6237 Assessment/Plan: s/p Procedure(s): PACEMAKER IMPLANT (N/A) Stable overall. Will transfer to renal floor today. Possible discharge tomorrow if no new cardiac issues.  05/02/2017 Dr. Donnetta Hutching Minimal drainage out of his JP drain. No hematoma noted. Stable for discharge from cardiac standpoint. We'll discharge today in follow-up with Dr. Bridgett Larsson  Significant Diagnostic Studies: CBC Lab Results  Component Value Date   WBC 4.6 05/09/2017   HGB 8.0 (L) 05/09/2017   HCT 27.3 (L) 05/09/2017   MCV 91.0 05/09/2017   PLT 209 05/09/2017    BMET    Component Value Date/Time   NA 133 (L) 05/09/2017 0238   K 3.3 (L) 05/09/2017 0238   CL 99 (L) 05/09/2017 0238   CO2 25 05/09/2017 0238   GLUCOSE 120 (H) 05/09/2017 0238   BUN 19 05/09/2017 0238   CREATININE 5.40 (H) 05/09/2017 0238   CREATININE 8.97 (H)  10/07/2015 1500   CALCIUM 7.9 (L) 05/09/2017 0238   GFRNONAA 10 (L) 05/09/2017 0238   GFRNONAA 6 (L) 10/07/2015 1500   GFRAA 12 (L) 05/09/2017 0238   GFRAA 6 (L) 10/07/2015 1500   COAG Lab Results  Component Value Date   INR 2.38 05/09/2017   INR 2.44 05/08/2017   INR 1.93 05/07/2017     Disposition:  Discharge to :Home  Allergies as of 05/02/2017      Reactions   No Known Allergies       Medication List    TAKE these medications   acetaminophen 500 MG tablet Commonly known as:  TYLENOL Take 500 mg by mouth every 6 (six) hours as needed for mild pain.   amiodarone 200 MG tablet Commonly known as:  PACERONE TAKE 1 TABLET BY MOUTH DAILY, START THIS ON 11/06/16 What changed:  See the new instructions.   atorvastatin 40 MG tablet Commonly known as:  LIPITOR TAKE 1 TABLET(40 MG) BY MOUTH DAILY AT 6 PM   latanoprost 0.005 % ophthalmic solution Commonly known as:  XALATAN Place 1 drop into both eyes at bedtime.   pantoprazole 40 MG tablet Commonly known as:  PROTONIX Take 1 tablet (40 mg total) by mouth 2 (two) times daily before a meal.   PHOSLO 667 MG capsule Generic drug:  calcium acetate Take 1,334 mg by mouth 3 (three) times daily with meals.   SENSIPAR 30 MG tablet Generic drug:  cinacalcet Take 30 mg by mouth every evening.   silver sulfADIAZINE 1 % cream Commonly known as:  SILVADENE Apply 1 application topically daily as needed (irratation).   SSD 1 % cream Generic drug:  silver sulfADIAZINE APPLY EXTERNALLY TO THE AFFECTED AREA DAILY   traMADol 50 MG tablet Commonly known as:  ULTRAM Take 50 mg by mouth every 6 (six) hours as needed for moderate pain.   warfarin 2.5 MG tablet Commonly known as:  COUMADIN Take 1 tablet daily except 1.5 tablets on Wed and Fri or as directed by Anticoagulation Clinic What changed:  how much to take  how to take this  when to take this  additional instructions      Verbal and written Discharge  instructions given to the patient. Wound care per Discharge AVS  F/U with Dr. Bridgett Larsson in 2-3 weeks  Signed: Laurence Slate [facility] 05/11/2017, 3:58 PM  Addendum  This patient was admitted after a L arm hematoma decompression (after a L 2nd stage BRVT on chronic anticoagulation) complicated by complete heart block. The majority of this patient's admission was due to his complete heart block and subsequent need for pacemaker placement.  The patient left arm drain was removed during this admission and patient will follow up as an outpatient for further management of his L arm staged BRVT in 2 weeks.  Pt will also follow up with Cardiology/EP for further mgmt of his cardiac issues.   Adele Barthel, MD, FACS [facility] [place] Office: [phone] Pager: [phone]  05/12/2017, 12:04 PM

## 2017-05-13 DIAGNOSIS — Z4802 Encounter for removal of sutures: Secondary | ICD-10-CM | POA: Diagnosis not present

## 2017-05-13 DIAGNOSIS — N186 End stage renal disease: Secondary | ICD-10-CM | POA: Diagnosis not present

## 2017-05-13 DIAGNOSIS — D631 Anemia in chronic kidney disease: Secondary | ICD-10-CM | POA: Diagnosis not present

## 2017-05-13 DIAGNOSIS — E1122 Type 2 diabetes mellitus with diabetic chronic kidney disease: Secondary | ICD-10-CM | POA: Diagnosis not present

## 2017-05-13 DIAGNOSIS — N2581 Secondary hyperparathyroidism of renal origin: Secondary | ICD-10-CM | POA: Diagnosis not present

## 2017-05-14 ENCOUNTER — Ambulatory Visit (INDEPENDENT_AMBULATORY_CARE_PROVIDER_SITE_OTHER): Payer: Medicare Other | Admitting: *Deleted

## 2017-05-14 DIAGNOSIS — I442 Atrioventricular block, complete: Secondary | ICD-10-CM | POA: Diagnosis not present

## 2017-05-14 LAB — CUP PACEART INCLINIC DEVICE CHECK
Battery Voltage: 3.22 V
Brady Statistic AP VP Percent: 0.05 %
Brady Statistic AS VS Percent: 96.82 %
Brady Statistic RA Percent Paced: 0.2 %
Brady Statistic RV Percent Paced: 3.06 %
Implantable Lead Implant Date: 20181005
Implantable Lead Location: 753859
Implantable Lead Model: 5076
Implantable Pulse Generator Implant Date: 20181005
Lead Channel Impedance Value: 304 Ohm
Lead Channel Impedance Value: 361 Ohm
Lead Channel Impedance Value: 380 Ohm
Lead Channel Impedance Value: 475 Ohm
Lead Channel Pacing Threshold Amplitude: 0.5 V
Lead Channel Pacing Threshold Amplitude: 0.75 V
Lead Channel Pacing Threshold Pulse Width: 0.4 ms
Lead Channel Setting Pacing Amplitude: 3.5 V
Lead Channel Setting Pacing Amplitude: 3.5 V
Lead Channel Setting Pacing Pulse Width: 0.4 ms
Lead Channel Setting Sensing Sensitivity: 2 mV
MDC IDC LEAD IMPLANT DT: 20181005
MDC IDC LEAD LOCATION: 753860
MDC IDC MSMT BATTERY REMAINING LONGEVITY: 183 mo
MDC IDC MSMT LEADCHNL RA PACING THRESHOLD PULSEWIDTH: 0.4 ms
MDC IDC MSMT LEADCHNL RA SENSING INTR AMPL: 4.5 mV
MDC IDC MSMT LEADCHNL RA SENSING INTR AMPL: 5.75 mV
MDC IDC MSMT LEADCHNL RV SENSING INTR AMPL: 12 mV
MDC IDC MSMT LEADCHNL RV SENSING INTR AMPL: 9.875 mV
MDC IDC SESS DTM: 20181019135837
MDC IDC STAT BRADY AP VS PERCENT: 0.12 %
MDC IDC STAT BRADY AS VP PERCENT: 3.01 %

## 2017-05-14 NOTE — Progress Notes (Signed)
Wound check appointment. Steri-strips removed. Wound without redness or edema. Incision edges approximated, wound well healed. Normal device function. Thresholds, sensing, and impedances consistent with implant measurements. Device programmed at 3.5V for extra safety margin until 3 month visit. Histogram distribution appropriate for patient and level of activity. No mode switches or high ventricular rates noted. Patient educated about wound care, arm mobility, lifting restrictions. ROV in 3 months with GT. 

## 2017-05-15 DIAGNOSIS — E1122 Type 2 diabetes mellitus with diabetic chronic kidney disease: Secondary | ICD-10-CM | POA: Diagnosis not present

## 2017-05-15 DIAGNOSIS — D631 Anemia in chronic kidney disease: Secondary | ICD-10-CM | POA: Diagnosis not present

## 2017-05-15 DIAGNOSIS — N186 End stage renal disease: Secondary | ICD-10-CM | POA: Diagnosis not present

## 2017-05-15 DIAGNOSIS — Z4802 Encounter for removal of sutures: Secondary | ICD-10-CM | POA: Diagnosis not present

## 2017-05-15 DIAGNOSIS — N2581 Secondary hyperparathyroidism of renal origin: Secondary | ICD-10-CM | POA: Diagnosis not present

## 2017-05-17 ENCOUNTER — Ambulatory Visit (INDEPENDENT_AMBULATORY_CARE_PROVIDER_SITE_OTHER): Payer: Medicare Other | Admitting: Cardiology

## 2017-05-17 DIAGNOSIS — Z8673 Personal history of transient ischemic attack (TIA), and cerebral infarction without residual deficits: Secondary | ICD-10-CM | POA: Diagnosis not present

## 2017-05-17 DIAGNOSIS — I480 Paroxysmal atrial fibrillation: Secondary | ICD-10-CM

## 2017-05-17 DIAGNOSIS — D631 Anemia in chronic kidney disease: Secondary | ICD-10-CM | POA: Diagnosis not present

## 2017-05-17 DIAGNOSIS — R2689 Other abnormalities of gait and mobility: Secondary | ICD-10-CM | POA: Diagnosis not present

## 2017-05-17 DIAGNOSIS — I442 Atrioventricular block, complete: Secondary | ICD-10-CM

## 2017-05-17 DIAGNOSIS — N186 End stage renal disease: Secondary | ICD-10-CM | POA: Diagnosis not present

## 2017-05-17 DIAGNOSIS — G459 Transient cerebral ischemic attack, unspecified: Secondary | ICD-10-CM

## 2017-05-17 DIAGNOSIS — I132 Hypertensive heart and chronic kidney disease with heart failure and with stage 5 chronic kidney disease, or end stage renal disease: Secondary | ICD-10-CM | POA: Diagnosis not present

## 2017-05-17 DIAGNOSIS — E1122 Type 2 diabetes mellitus with diabetic chronic kidney disease: Secondary | ICD-10-CM | POA: Diagnosis not present

## 2017-05-17 DIAGNOSIS — I5042 Chronic combined systolic (congestive) and diastolic (congestive) heart failure: Secondary | ICD-10-CM | POA: Diagnosis not present

## 2017-05-17 DIAGNOSIS — Z5181 Encounter for therapeutic drug level monitoring: Secondary | ICD-10-CM | POA: Diagnosis not present

## 2017-05-17 LAB — POCT INR: INR: 8

## 2017-05-18 ENCOUNTER — Other Ambulatory Visit: Payer: Medicare Other

## 2017-05-18 ENCOUNTER — Ambulatory Visit (INDEPENDENT_AMBULATORY_CARE_PROVIDER_SITE_OTHER): Payer: Medicare Other | Admitting: Cardiovascular Disease

## 2017-05-18 DIAGNOSIS — E1122 Type 2 diabetes mellitus with diabetic chronic kidney disease: Secondary | ICD-10-CM | POA: Diagnosis not present

## 2017-05-18 DIAGNOSIS — Z4802 Encounter for removal of sutures: Secondary | ICD-10-CM | POA: Diagnosis not present

## 2017-05-18 DIAGNOSIS — Z5181 Encounter for therapeutic drug level monitoring: Secondary | ICD-10-CM | POA: Diagnosis not present

## 2017-05-18 DIAGNOSIS — D631 Anemia in chronic kidney disease: Secondary | ICD-10-CM | POA: Diagnosis not present

## 2017-05-18 DIAGNOSIS — G459 Transient cerebral ischemic attack, unspecified: Secondary | ICD-10-CM | POA: Diagnosis not present

## 2017-05-18 DIAGNOSIS — N2581 Secondary hyperparathyroidism of renal origin: Secondary | ICD-10-CM | POA: Diagnosis not present

## 2017-05-18 DIAGNOSIS — N186 End stage renal disease: Secondary | ICD-10-CM | POA: Diagnosis not present

## 2017-05-18 LAB — PROTIME-INR
INR: 6.3 (ref 0.8–1.2)
PROTHROMBIN TIME: 66.3 s — AB (ref 9.1–12.0)

## 2017-05-19 NOTE — Progress Notes (Signed)
    Postoperative Access Visit   History of Present Illness   Oscar Castillo is a 66 y.o. year old male who presents for postoperative follow-up for: left second stage brachial vein transposition (Date: 04/21/17).  This patient is on chronic anticoagulation and developed a large hematoma that was spontaneously decompressing.  I evacuated the hematoma on 04/29/17.  Intra-operatively this was complicated by complete heart block.  The patient required temporary pacing to keep his heart functioning.  Eventually he had R SCV PPM placed on 04/30/17.  The patient's wounds are healing.  No further drainage.  The patient notes mp steal symptoms.  The patient is able to complete their activities of daily living.  The patient's current symptoms are: none.   Physical Examination   Vitals:   05/21/17 1042  BP: 133/83  Pulse: 81  Resp: 18  Temp: (!) 97 F (36.1 C)  SpO2: 100%  Weight: 210 lb (95.3 kg)  Height: 5\' 10"  (1.778 m)   Body mass index is 30.13 kg/m.  left arm Incision is healing, staples and sutures in place, skin feels warm, hand grip is 5/5, sensation in digits is intact, palpable thrill, bruit can be auscultated     Medical Decision Making   Oscar Castillo is a 66 y.o. year old male who presents s/p left second stage brachial vein transposition complicated by bleeding, s/p L arm hematoma evacuation, s/p R PPM    Bleeding complication in this patient not surprising given need for chronic anticoagulation.  No active bleeding found intraoperatively.  The development of complete heart block demonstrates the overall poor health of this patient, as does the bilateral amputee status of this patient.    Will pull half of staples today and half next week.  Thank you for allowing [place] to participate in this patient's care.   Adele Barthel, MD, FACS [facility] [place] Office: [phone] Pager: [phone]

## 2017-05-20 ENCOUNTER — Telehealth: Payer: Self-pay | Admitting: Internal Medicine

## 2017-05-20 DIAGNOSIS — E1122 Type 2 diabetes mellitus with diabetic chronic kidney disease: Secondary | ICD-10-CM | POA: Diagnosis not present

## 2017-05-20 DIAGNOSIS — D631 Anemia in chronic kidney disease: Secondary | ICD-10-CM | POA: Diagnosis not present

## 2017-05-20 DIAGNOSIS — Z5181 Encounter for therapeutic drug level monitoring: Secondary | ICD-10-CM | POA: Diagnosis not present

## 2017-05-20 DIAGNOSIS — N2581 Secondary hyperparathyroidism of renal origin: Secondary | ICD-10-CM | POA: Diagnosis not present

## 2017-05-20 DIAGNOSIS — I4891 Unspecified atrial fibrillation: Secondary | ICD-10-CM | POA: Diagnosis not present

## 2017-05-20 DIAGNOSIS — Z4802 Encounter for removal of sutures: Secondary | ICD-10-CM | POA: Diagnosis not present

## 2017-05-20 DIAGNOSIS — N186 End stage renal disease: Secondary | ICD-10-CM | POA: Diagnosis not present

## 2017-05-20 NOTE — Telephone Encounter (Addendum)
Called for a re-certification for Skilled nursing and PT   Needs verbals for 1week9 for skilled nursing  Now has a pressure ulcer on his left buttock now-stage 2  Does not have frequency for PT, will fax orders for MD to sign   Patient does have bed bugs-patient refuses to use hospital bed-cannot afford to get house professionally exterminated-unknown is they are in carpet

## 2017-05-21 ENCOUNTER — Encounter: Payer: Self-pay | Admitting: Vascular Surgery

## 2017-05-21 ENCOUNTER — Ambulatory Visit (INDEPENDENT_AMBULATORY_CARE_PROVIDER_SITE_OTHER): Payer: Self-pay | Admitting: Vascular Surgery

## 2017-05-21 VITALS — BP 133/83 | HR 81 | Temp 97.0°F | Resp 18 | Ht 70.0 in | Wt 210.0 lb

## 2017-05-21 DIAGNOSIS — N186 End stage renal disease: Secondary | ICD-10-CM | POA: Diagnosis not present

## 2017-05-21 DIAGNOSIS — I132 Hypertensive heart and chronic kidney disease with heart failure and with stage 5 chronic kidney disease, or end stage renal disease: Secondary | ICD-10-CM | POA: Diagnosis not present

## 2017-05-21 DIAGNOSIS — F329 Major depressive disorder, single episode, unspecified: Secondary | ICD-10-CM | POA: Diagnosis not present

## 2017-05-21 DIAGNOSIS — L89302 Pressure ulcer of unspecified buttock, stage 2: Secondary | ICD-10-CM | POA: Diagnosis not present

## 2017-05-21 DIAGNOSIS — I442 Atrioventricular block, complete: Secondary | ICD-10-CM | POA: Diagnosis not present

## 2017-05-21 DIAGNOSIS — D631 Anemia in chronic kidney disease: Secondary | ICD-10-CM | POA: Diagnosis not present

## 2017-05-21 DIAGNOSIS — Z992 Dependence on renal dialysis: Secondary | ICD-10-CM

## 2017-05-21 DIAGNOSIS — E1122 Type 2 diabetes mellitus with diabetic chronic kidney disease: Secondary | ICD-10-CM | POA: Diagnosis not present

## 2017-05-21 DIAGNOSIS — Z89512 Acquired absence of left leg below knee: Secondary | ICD-10-CM | POA: Diagnosis not present

## 2017-05-21 DIAGNOSIS — N400 Enlarged prostate without lower urinary tract symptoms: Secondary | ICD-10-CM | POA: Diagnosis not present

## 2017-05-21 DIAGNOSIS — I5042 Chronic combined systolic (congestive) and diastolic (congestive) heart failure: Secondary | ICD-10-CM | POA: Diagnosis not present

## 2017-05-21 DIAGNOSIS — Z48812 Encounter for surgical aftercare following surgery on the circulatory system: Secondary | ICD-10-CM | POA: Diagnosis not present

## 2017-05-21 DIAGNOSIS — Z7901 Long term (current) use of anticoagulants: Secondary | ICD-10-CM | POA: Diagnosis not present

## 2017-05-21 DIAGNOSIS — K219 Gastro-esophageal reflux disease without esophagitis: Secondary | ICD-10-CM | POA: Diagnosis not present

## 2017-05-21 DIAGNOSIS — Z950 Presence of cardiac pacemaker: Secondary | ICD-10-CM | POA: Diagnosis not present

## 2017-05-21 DIAGNOSIS — B1920 Unspecified viral hepatitis C without hepatic coma: Secondary | ICD-10-CM | POA: Diagnosis not present

## 2017-05-21 DIAGNOSIS — I4891 Unspecified atrial fibrillation: Secondary | ICD-10-CM | POA: Diagnosis not present

## 2017-05-21 DIAGNOSIS — Z89511 Acquired absence of right leg below knee: Secondary | ICD-10-CM | POA: Diagnosis not present

## 2017-05-21 NOTE — Telephone Encounter (Signed)
Notified Amy w/MD response../lmb 

## 2017-05-21 NOTE — Telephone Encounter (Signed)
MD out of office pls advise.../lmb 

## 2017-05-21 NOTE — Telephone Encounter (Signed)
Ok to give verbals.  Not sure what  Can be done about bed bugs.   Dr [name] will be back next week if further instruction is needed

## 2017-05-22 DIAGNOSIS — E1122 Type 2 diabetes mellitus with diabetic chronic kidney disease: Secondary | ICD-10-CM | POA: Diagnosis not present

## 2017-05-22 DIAGNOSIS — N2581 Secondary hyperparathyroidism of renal origin: Secondary | ICD-10-CM | POA: Diagnosis not present

## 2017-05-22 DIAGNOSIS — N186 End stage renal disease: Secondary | ICD-10-CM | POA: Diagnosis not present

## 2017-05-22 DIAGNOSIS — Z4802 Encounter for removal of sutures: Secondary | ICD-10-CM | POA: Diagnosis not present

## 2017-05-22 DIAGNOSIS — D631 Anemia in chronic kidney disease: Secondary | ICD-10-CM | POA: Diagnosis not present

## 2017-05-24 ENCOUNTER — Ambulatory Visit (INDEPENDENT_AMBULATORY_CARE_PROVIDER_SITE_OTHER): Payer: Medicare Other | Admitting: Internal Medicine

## 2017-05-24 DIAGNOSIS — Z8673 Personal history of transient ischemic attack (TIA), and cerebral infarction without residual deficits: Secondary | ICD-10-CM

## 2017-05-24 DIAGNOSIS — N186 End stage renal disease: Secondary | ICD-10-CM | POA: Diagnosis not present

## 2017-05-24 DIAGNOSIS — G459 Transient cerebral ischemic attack, unspecified: Secondary | ICD-10-CM | POA: Diagnosis not present

## 2017-05-24 DIAGNOSIS — Z48812 Encounter for surgical aftercare following surgery on the circulatory system: Secondary | ICD-10-CM | POA: Diagnosis not present

## 2017-05-24 DIAGNOSIS — I132 Hypertensive heart and chronic kidney disease with heart failure and with stage 5 chronic kidney disease, or end stage renal disease: Secondary | ICD-10-CM | POA: Diagnosis not present

## 2017-05-24 DIAGNOSIS — I480 Paroxysmal atrial fibrillation: Secondary | ICD-10-CM

## 2017-05-24 DIAGNOSIS — I442 Atrioventricular block, complete: Secondary | ICD-10-CM | POA: Diagnosis not present

## 2017-05-24 DIAGNOSIS — Z5181 Encounter for therapeutic drug level monitoring: Secondary | ICD-10-CM | POA: Diagnosis not present

## 2017-05-24 DIAGNOSIS — E1122 Type 2 diabetes mellitus with diabetic chronic kidney disease: Secondary | ICD-10-CM | POA: Diagnosis not present

## 2017-05-24 DIAGNOSIS — I5042 Chronic combined systolic (congestive) and diastolic (congestive) heart failure: Secondary | ICD-10-CM | POA: Diagnosis not present

## 2017-05-24 LAB — POCT INR: INR: 2.2

## 2017-05-25 DIAGNOSIS — N2581 Secondary hyperparathyroidism of renal origin: Secondary | ICD-10-CM | POA: Diagnosis not present

## 2017-05-25 DIAGNOSIS — D631 Anemia in chronic kidney disease: Secondary | ICD-10-CM | POA: Diagnosis not present

## 2017-05-25 DIAGNOSIS — E1122 Type 2 diabetes mellitus with diabetic chronic kidney disease: Secondary | ICD-10-CM | POA: Diagnosis not present

## 2017-05-25 DIAGNOSIS — Z4802 Encounter for removal of sutures: Secondary | ICD-10-CM | POA: Diagnosis not present

## 2017-05-25 DIAGNOSIS — N186 End stage renal disease: Secondary | ICD-10-CM | POA: Diagnosis not present

## 2017-05-26 DIAGNOSIS — N186 End stage renal disease: Secondary | ICD-10-CM | POA: Diagnosis not present

## 2017-05-26 DIAGNOSIS — I442 Atrioventricular block, complete: Secondary | ICD-10-CM | POA: Diagnosis not present

## 2017-05-26 DIAGNOSIS — Z48812 Encounter for surgical aftercare following surgery on the circulatory system: Secondary | ICD-10-CM | POA: Diagnosis not present

## 2017-05-26 DIAGNOSIS — E1122 Type 2 diabetes mellitus with diabetic chronic kidney disease: Secondary | ICD-10-CM | POA: Diagnosis not present

## 2017-05-26 DIAGNOSIS — I132 Hypertensive heart and chronic kidney disease with heart failure and with stage 5 chronic kidney disease, or end stage renal disease: Secondary | ICD-10-CM | POA: Diagnosis not present

## 2017-05-26 DIAGNOSIS — Z992 Dependence on renal dialysis: Secondary | ICD-10-CM | POA: Diagnosis not present

## 2017-05-26 DIAGNOSIS — I5042 Chronic combined systolic (congestive) and diastolic (congestive) heart failure: Secondary | ICD-10-CM | POA: Diagnosis not present

## 2017-05-27 ENCOUNTER — Other Ambulatory Visit: Payer: Self-pay | Admitting: *Deleted

## 2017-05-27 DIAGNOSIS — N2581 Secondary hyperparathyroidism of renal origin: Secondary | ICD-10-CM | POA: Diagnosis not present

## 2017-05-27 DIAGNOSIS — D631 Anemia in chronic kidney disease: Secondary | ICD-10-CM | POA: Diagnosis not present

## 2017-05-27 DIAGNOSIS — R0602 Shortness of breath: Secondary | ICD-10-CM | POA: Diagnosis not present

## 2017-05-27 DIAGNOSIS — E1122 Type 2 diabetes mellitus with diabetic chronic kidney disease: Secondary | ICD-10-CM | POA: Diagnosis not present

## 2017-05-27 DIAGNOSIS — N186 End stage renal disease: Secondary | ICD-10-CM | POA: Diagnosis not present

## 2017-05-27 MED ORDER — WARFARIN SODIUM 2.5 MG PO TABS: ORAL_TABLET | ORAL | 3 refills | Status: DC

## 2017-05-27 NOTE — Progress Notes (Signed)
    Postoperative Access Visit   History of Present Illness   Oscar Castillo is a 66 y.o. year old male who presents for postoperative follow-up for: left second stage brachial vein transposition (Date: 04/21/17).  This patient is on chronic anticoagulation and developed a large hematoma that was spontaneously decompressing.  I evacuated the hematoma on 04/29/17.  Intra-operatively this was complicated by complete heart block.  The patient required temporary pacing to keep his heart functioning.  Eventually he had R SCV PPM placed on 04/30/17.  The patient's wounds are healing.  No further drainage.  The patient notes no steal symptoms.  The patient is able to complete their activities of daily living.  The patient's current symptoms are: none.   Physical Examination   Vitals:   05/28/17 1326  BP: 104/73  Pulse: 87  Resp: 18  Temp: 98.5 F (36.9 C)  TempSrc: Oral  SpO2: 100%  Height: 5\' 10"  (1.778 m)   left arm Incision is healing, remaining suture and staples, skin feels warm, hand grip is 5/5, sensation in digits is intact, palpable thrill, bruit can be auscultated     Medical Decision Making   Oscar Castillo is a 66 y.o. year old male who presents s/p left second stage brachial vein transposition complicated by bleeding, s/p L arm hematoma evacuation, s/p R PPM    Remaining sutures and staples removed  Check again in 4 weeks prior to proceeding with use.  Thank you for allowing [place] to participate in this patient's care.   Adele Barthel, MD, FACS [facility] [place] Office: [phone] Pager: [phone]

## 2017-05-27 NOTE — Telephone Encounter (Signed)
Pharmacy requested a ninety day rx. 

## 2017-05-28 ENCOUNTER — Encounter: Payer: Self-pay | Admitting: Vascular Surgery

## 2017-05-28 ENCOUNTER — Ambulatory Visit (INDEPENDENT_AMBULATORY_CARE_PROVIDER_SITE_OTHER): Payer: Self-pay | Admitting: Vascular Surgery

## 2017-05-28 VITALS — BP 104/73 | HR 87 | Temp 98.5°F | Resp 18 | Ht 70.0 in

## 2017-05-28 DIAGNOSIS — Z992 Dependence on renal dialysis: Secondary | ICD-10-CM

## 2017-05-28 DIAGNOSIS — N186 End stage renal disease: Secondary | ICD-10-CM

## 2017-05-29 DIAGNOSIS — E1122 Type 2 diabetes mellitus with diabetic chronic kidney disease: Secondary | ICD-10-CM | POA: Diagnosis not present

## 2017-05-29 DIAGNOSIS — N2581 Secondary hyperparathyroidism of renal origin: Secondary | ICD-10-CM | POA: Diagnosis not present

## 2017-05-29 DIAGNOSIS — D631 Anemia in chronic kidney disease: Secondary | ICD-10-CM | POA: Diagnosis not present

## 2017-05-29 DIAGNOSIS — R0602 Shortness of breath: Secondary | ICD-10-CM | POA: Diagnosis not present

## 2017-05-29 DIAGNOSIS — N186 End stage renal disease: Secondary | ICD-10-CM | POA: Diagnosis not present

## 2017-05-31 ENCOUNTER — Ambulatory Visit (INDEPENDENT_AMBULATORY_CARE_PROVIDER_SITE_OTHER): Payer: Medicare Other | Admitting: Internal Medicine

## 2017-05-31 ENCOUNTER — Encounter: Payer: Self-pay | Admitting: Internal Medicine

## 2017-05-31 ENCOUNTER — Ambulatory Visit (INDEPENDENT_AMBULATORY_CARE_PROVIDER_SITE_OTHER): Payer: Medicare Other | Admitting: *Deleted

## 2017-05-31 VITALS — BP 164/88 | HR 87 | Ht 70.0 in | Wt 205.0 lb

## 2017-05-31 DIAGNOSIS — I4891 Unspecified atrial fibrillation: Secondary | ICD-10-CM | POA: Diagnosis not present

## 2017-05-31 DIAGNOSIS — I10 Essential (primary) hypertension: Secondary | ICD-10-CM | POA: Diagnosis not present

## 2017-05-31 DIAGNOSIS — I132 Hypertensive heart and chronic kidney disease with heart failure and with stage 5 chronic kidney disease, or end stage renal disease: Secondary | ICD-10-CM | POA: Diagnosis not present

## 2017-05-31 DIAGNOSIS — I6389 Other cerebral infarction: Secondary | ICD-10-CM | POA: Diagnosis not present

## 2017-05-31 DIAGNOSIS — G459 Transient cerebral ischemic attack, unspecified: Secondary | ICD-10-CM

## 2017-05-31 DIAGNOSIS — I5042 Chronic combined systolic (congestive) and diastolic (congestive) heart failure: Secondary | ICD-10-CM | POA: Diagnosis not present

## 2017-05-31 DIAGNOSIS — I442 Atrioventricular block, complete: Secondary | ICD-10-CM | POA: Diagnosis not present

## 2017-05-31 DIAGNOSIS — Z5181 Encounter for therapeutic drug level monitoring: Secondary | ICD-10-CM | POA: Diagnosis not present

## 2017-05-31 DIAGNOSIS — E1122 Type 2 diabetes mellitus with diabetic chronic kidney disease: Secondary | ICD-10-CM | POA: Diagnosis not present

## 2017-05-31 DIAGNOSIS — I480 Paroxysmal atrial fibrillation: Secondary | ICD-10-CM

## 2017-05-31 DIAGNOSIS — N186 End stage renal disease: Secondary | ICD-10-CM | POA: Diagnosis not present

## 2017-05-31 DIAGNOSIS — Z48812 Encounter for surgical aftercare following surgery on the circulatory system: Secondary | ICD-10-CM | POA: Diagnosis not present

## 2017-05-31 DIAGNOSIS — I5032 Chronic diastolic (congestive) heart failure: Secondary | ICD-10-CM | POA: Diagnosis not present

## 2017-05-31 LAB — PROTIME-INR
INR: 3.6 — ABNORMAL HIGH (ref 0.8–1.2)
Prothrombin Time: 38.2 s — ABNORMAL HIGH (ref 9.1–12.0)

## 2017-05-31 NOTE — Patient Instructions (Signed)
Your physician recommends that you continue on your current medications as directed. Please refer to the Current Medication list given to you today.  Your physician wants you to follow-up in: April, 2019 with Dr. Harrington Challenger.  You will receive a reminder letter in the mail two months in advance. If you don't receive a letter, please call our office to schedule the follow-up appointment.

## 2017-05-31 NOTE — Progress Notes (Signed)
Cardiology Office Note   Date:  05/31/2017   ID:  Oscar Castillo, DOB [birth year], MRN [id number]  PCP:  [name], MD  Cardiologist:   Dorris Carnes, MD   Follow up of chronic systolic / diastolic CHF , HTN and PAF   History of Present Illness: Oscar Castillo is a 66 y.o. male with a history of ESRD on hemodialysis, chronic combined systolic and diastolic CHF, CVA, diabetes, HTN, hepatitis C, paroxysmal AF (on coumadin for CHADSVASC score of 7),  PVOD(status post bilateral BKA's), and LBBB.  LV function was normal on echocardiogram in 2017. He had artifact on his Myoview that was explained by LBBB but no ischemia (2017).   Patient was evaluated in 2/18 for heme positive stools. EGD demonstrated esophagitis, gastritis/duodenitis. He was then admitted 3/3-3/9 with blood loss anemia in the setting of melena and suspected hematemesis. He required transfusion with PRBCs. Capsule endoscopy was reportedly unremarkable. Coumadin was resumed at discharge without Lovenox bridging.  He  was noted in AFib with RVR with hypotension when seen in clinic by Richardson Dopp, PA 10/07/16. INR of 1.4. Evaluated by DOD Dr. Marlou Porch and started on IV heparin and IV amiodarone and admitted to hospital. Converted to sinus rhythm. Discharged on amiodarone 400 mg PO BID for 2 weeks, then 400 mg daily for 2 weeks, then 200 mg daily. Resumed coumadin. Due to soft BP-->  BB and ACE held.   He was admitted with sepsis in April 2018 due to infected AV fistula  Temporary catheter placed    Since d/c he says his breathing has been good  Denies CP  He denies palpitations   Being evaluated for shunt placement    Current Meds  Medication Sig  . acetaminophen (TYLENOL) 500 MG tablet Take 500 mg by mouth every 6 (six) hours as needed for mild pain.  [name] amiodarone (PACERONE) 200 MG tablet TAKE 1 TABLET BY MOUTH DAILY, START THIS ON 11/06/16 (Patient taking differently: TAKE 200 mg TABLET BY MOUTH DAILY, START THIS ON 11/06/16)  .  atorvastatin (LIPITOR) 40 MG tablet TAKE 1 TABLET(40 MG) BY MOUTH DAILY AT 6 PM  . calcium acetate (PHOSLO) 667 MG capsule Take 1,334 mg by mouth 3 (three) times daily with meals.   . latanoprost (XALATAN) 0.005 % ophthalmic solution Place 1 drop into both eyes at bedtime.   . pantoprazole (PROTONIX) 40 MG tablet Take 1 tablet (40 mg total) by mouth 2 (two) times daily before a meal.  . QUEtiapine (SEROQUEL) 25 MG tablet Take 0.5 tablets (12.5 mg total) by mouth at bedtime.  . senna (SENOKOT) 8.6 MG TABS tablet Take 1 tablet (8.6 mg total) by mouth daily.  . SENSIPAR 30 MG tablet Take 30 mg by mouth every evening.   . silver sulfADIAZINE (SILVADENE) 1 % cream Apply 1 application topically daily as needed (irratation).  . sodium phosphate (FLEET) 7-19 GM/118ML ENEM Place 133 mLs (1 enema total) rectally daily as needed for severe constipation.  . sorbitol 70 % SOLN Take 30 mLs by mouth 2 (two) times daily.  [name] SSD 1 % cream APPLY EXTERNALLY TO THE AFFECTED AREA DAILY  . traMADol (ULTRAM) 50 MG tablet Take 50 mg by mouth every 6 (six) hours as needed for moderate pain.  [name] warfarin (COUMADIN) 2.5 MG tablet Take as directed by Anticoagulation Clinic     Allergies:   No known allergies   Past Medical History:  Diagnosis Date  . Anemia   . Antral  ulcer 2014   small  . BENIGN PROSTATIC HYPERTROPHY   . Bipolar disorder ([place])    "sometimes" (10/07/2016)  . CHOLELITHIASIS   . Chronic combined systolic and diastolic CHF (congestive heart failure) ([place])   . Complication of anesthesia    wife states pt had trouble waking up with in Nov., 2014  . CVA (cerebral vascular accident) ([place]) 07/2007  . DEPRESSION   . DIABETES MELLITUS, TYPE II    diet control  . ERECTILE DYSFUNCTION   . ESRD on hemodialysis ([place])    ESRD due to DM/HTN. Started dialysis in November 2013.  HD TTS at [facility] on [address].  [name] GERD   . GI bleed    due to gastritis, discharged 10/02/16/notes 10/07/2016  . Hepatitis C      C - has been treated  . History of Clostridium difficile   . History of kidney stones   . HYPERTENSION   . LBBB (left bundle branch block)   . Morbid obesity ([place])   . PAF (paroxysmal atrial fibrillation) ([place])    a. Dx 12/2015.    Past Surgical History:  Procedure Laterality Date  . EYE SURGERY Left    to remove scar tissue  . IR GENERIC HISTORICAL  09/27/2016   IR US GUIDE VASC ACCESS RIGHT 09/27/2016 Aletta Edouard, MD [facility]  . IR GENERIC HISTORICAL  09/27/2016   IR FLUORO GUIDE CV LINE RIGHT 09/27/2016 Aletta Edouard, MD [facility]  . NEPHRECTOMY     partial RR     Social History:  The patient  reports that  has never smoked. he has never used smokeless tobacco. He reports that he does not drink alcohol or use drugs.   Family History:  The patient's family history includes Coronary artery disease in his other; Diabetes in his mother; Heart attack in his father; Hypertension in his father and mother.    ROS:  Please see the history of present illness. All other systems are reviewed and  Negative to the above problem except as noted.    PHYSICAL EXAM: VS:  BP (!) 164/88   Pulse 87   Ht 5\' 10"  (1.778 m)   Wt 205 lb (93 kg) Comment: patient stated weight  SpO2 99%   BMI 29.41 kg/m   GEN: Well nourished, well developed, in no acute distress   Examined in wheelchair HEENT: normal  Neck: no JVD, carotid bruits, or masses Cardiac: RRR; no murmurs, rubs, or gallops,No edema of thigh Respiratory:  clear to auscultation bilaterally, normal work of breathing GI: soft, nontender, nondistended, + BS  No hepatomegaly  MS: s/p bilateral BKA   Skin: warm and dry, no rash Neuro:  Strength and sensation are intact Psych: euthymic mood, full affect   EKG:  EKG is not ordered today.   Lipid Panel    Component Value Date/Time   CHOL 115 02/10/2017 1717   TRIG 78 04/29/2017 2009   HDL 39.80 02/10/2017 1717   CHOLHDL 3 02/10/2017 1717   VLDL 9.6 02/10/2017 1717    LDLCALC 65 02/10/2017 1717      Wt Readings from Last 3 Encounters:  05/31/17 205 lb (93 kg)  05/21/17 210 lb (95.3 kg)  05/08/17 193 lb 12.6 oz (87.9 kg)      ASSESSMENT AND PLAN:  1  Chronic diastolic CHF  Volume is not bad  Keep on dialytsis  2  HTN  BP is up today  I am reluctant to change Rx with  dialysis   Has sched for tomorrow    3  PAF  Cliically in SR  Amiodarone was started in March 2018  I would keep on 200 mg per day for now  He hs appt to see me in April    4  LBBB  Chronic   6  PVOD  Followed by [facility]  Prosthesis is painful  Has appt tomorrow    7  HL  Continue Lipitor     Current medicines are reviewed at length with the patient today.  The patient does not have concerns regarding medicines.  Signed, Dorris Carnes, MD  05/31/2017 2:31 PM    [address], [place], [place]  [address] Phone: [phone]; Fax: [phone]

## 2017-06-01 DIAGNOSIS — D631 Anemia in chronic kidney disease: Secondary | ICD-10-CM | POA: Diagnosis not present

## 2017-06-01 DIAGNOSIS — R0602 Shortness of breath: Secondary | ICD-10-CM | POA: Diagnosis not present

## 2017-06-01 DIAGNOSIS — E1122 Type 2 diabetes mellitus with diabetic chronic kidney disease: Secondary | ICD-10-CM | POA: Diagnosis not present

## 2017-06-01 DIAGNOSIS — N2581 Secondary hyperparathyroidism of renal origin: Secondary | ICD-10-CM | POA: Diagnosis not present

## 2017-06-01 DIAGNOSIS — N186 End stage renal disease: Secondary | ICD-10-CM | POA: Diagnosis not present

## 2017-06-02 DIAGNOSIS — I5042 Chronic combined systolic (congestive) and diastolic (congestive) heart failure: Secondary | ICD-10-CM | POA: Diagnosis not present

## 2017-06-02 DIAGNOSIS — I132 Hypertensive heart and chronic kidney disease with heart failure and with stage 5 chronic kidney disease, or end stage renal disease: Secondary | ICD-10-CM | POA: Diagnosis not present

## 2017-06-02 DIAGNOSIS — E1122 Type 2 diabetes mellitus with diabetic chronic kidney disease: Secondary | ICD-10-CM | POA: Diagnosis not present

## 2017-06-02 DIAGNOSIS — I442 Atrioventricular block, complete: Secondary | ICD-10-CM | POA: Diagnosis not present

## 2017-06-02 DIAGNOSIS — N186 End stage renal disease: Secondary | ICD-10-CM | POA: Diagnosis not present

## 2017-06-02 DIAGNOSIS — Z48812 Encounter for surgical aftercare following surgery on the circulatory system: Secondary | ICD-10-CM | POA: Diagnosis not present

## 2017-06-03 DIAGNOSIS — N2581 Secondary hyperparathyroidism of renal origin: Secondary | ICD-10-CM | POA: Diagnosis not present

## 2017-06-03 DIAGNOSIS — D631 Anemia in chronic kidney disease: Secondary | ICD-10-CM | POA: Diagnosis not present

## 2017-06-03 DIAGNOSIS — E1122 Type 2 diabetes mellitus with diabetic chronic kidney disease: Secondary | ICD-10-CM | POA: Diagnosis not present

## 2017-06-03 DIAGNOSIS — R0602 Shortness of breath: Secondary | ICD-10-CM | POA: Diagnosis not present

## 2017-06-03 DIAGNOSIS — N186 End stage renal disease: Secondary | ICD-10-CM | POA: Diagnosis not present

## 2017-06-05 DIAGNOSIS — E1122 Type 2 diabetes mellitus with diabetic chronic kidney disease: Secondary | ICD-10-CM | POA: Diagnosis not present

## 2017-06-05 DIAGNOSIS — N186 End stage renal disease: Secondary | ICD-10-CM | POA: Diagnosis not present

## 2017-06-05 DIAGNOSIS — N2581 Secondary hyperparathyroidism of renal origin: Secondary | ICD-10-CM | POA: Diagnosis not present

## 2017-06-05 DIAGNOSIS — R0602 Shortness of breath: Secondary | ICD-10-CM | POA: Diagnosis not present

## 2017-06-05 DIAGNOSIS — D631 Anemia in chronic kidney disease: Secondary | ICD-10-CM | POA: Diagnosis not present

## 2017-06-07 ENCOUNTER — Ambulatory Visit (INDEPENDENT_AMBULATORY_CARE_PROVIDER_SITE_OTHER): Payer: Medicare Other | Admitting: *Deleted

## 2017-06-07 DIAGNOSIS — I132 Hypertensive heart and chronic kidney disease with heart failure and with stage 5 chronic kidney disease, or end stage renal disease: Secondary | ICD-10-CM | POA: Diagnosis not present

## 2017-06-07 DIAGNOSIS — I480 Paroxysmal atrial fibrillation: Secondary | ICD-10-CM

## 2017-06-07 DIAGNOSIS — Z5181 Encounter for therapeutic drug level monitoring: Secondary | ICD-10-CM | POA: Diagnosis not present

## 2017-06-07 DIAGNOSIS — Z8673 Personal history of transient ischemic attack (TIA), and cerebral infarction without residual deficits: Secondary | ICD-10-CM

## 2017-06-07 DIAGNOSIS — G459 Transient cerebral ischemic attack, unspecified: Secondary | ICD-10-CM

## 2017-06-07 DIAGNOSIS — I442 Atrioventricular block, complete: Secondary | ICD-10-CM | POA: Diagnosis not present

## 2017-06-07 DIAGNOSIS — N186 End stage renal disease: Secondary | ICD-10-CM | POA: Diagnosis not present

## 2017-06-07 DIAGNOSIS — E1122 Type 2 diabetes mellitus with diabetic chronic kidney disease: Secondary | ICD-10-CM | POA: Diagnosis not present

## 2017-06-07 DIAGNOSIS — I5042 Chronic combined systolic (congestive) and diastolic (congestive) heart failure: Secondary | ICD-10-CM | POA: Diagnosis not present

## 2017-06-07 DIAGNOSIS — I4891 Unspecified atrial fibrillation: Secondary | ICD-10-CM | POA: Diagnosis not present

## 2017-06-07 DIAGNOSIS — Z48812 Encounter for surgical aftercare following surgery on the circulatory system: Secondary | ICD-10-CM | POA: Diagnosis not present

## 2017-06-07 LAB — POCT INR: INR: 2.6

## 2017-06-07 NOTE — Progress Notes (Signed)
Continue same dose of coumadin  1 tablet (2.5mg )  daily. Recheck INR in 2 weeks

## 2017-06-08 DIAGNOSIS — N2581 Secondary hyperparathyroidism of renal origin: Secondary | ICD-10-CM | POA: Diagnosis not present

## 2017-06-08 DIAGNOSIS — D631 Anemia in chronic kidney disease: Secondary | ICD-10-CM | POA: Diagnosis not present

## 2017-06-08 DIAGNOSIS — R0602 Shortness of breath: Secondary | ICD-10-CM | POA: Diagnosis not present

## 2017-06-08 DIAGNOSIS — N186 End stage renal disease: Secondary | ICD-10-CM | POA: Diagnosis not present

## 2017-06-08 DIAGNOSIS — E1122 Type 2 diabetes mellitus with diabetic chronic kidney disease: Secondary | ICD-10-CM | POA: Diagnosis not present

## 2017-06-09 DIAGNOSIS — E1122 Type 2 diabetes mellitus with diabetic chronic kidney disease: Secondary | ICD-10-CM | POA: Diagnosis not present

## 2017-06-09 DIAGNOSIS — I442 Atrioventricular block, complete: Secondary | ICD-10-CM | POA: Diagnosis not present

## 2017-06-09 DIAGNOSIS — I132 Hypertensive heart and chronic kidney disease with heart failure and with stage 5 chronic kidney disease, or end stage renal disease: Secondary | ICD-10-CM | POA: Diagnosis not present

## 2017-06-09 DIAGNOSIS — Z48812 Encounter for surgical aftercare following surgery on the circulatory system: Secondary | ICD-10-CM | POA: Diagnosis not present

## 2017-06-09 DIAGNOSIS — N186 End stage renal disease: Secondary | ICD-10-CM | POA: Diagnosis not present

## 2017-06-09 DIAGNOSIS — I5042 Chronic combined systolic (congestive) and diastolic (congestive) heart failure: Secondary | ICD-10-CM | POA: Diagnosis not present

## 2017-06-09 NOTE — Progress Notes (Signed)
Second response sent to [facility] notifying them that Dr. Lovena Le did not order wound care for a pacemaker placement.

## 2017-06-10 DIAGNOSIS — N186 End stage renal disease: Secondary | ICD-10-CM | POA: Diagnosis not present

## 2017-06-10 DIAGNOSIS — R0602 Shortness of breath: Secondary | ICD-10-CM | POA: Diagnosis not present

## 2017-06-10 DIAGNOSIS — N2581 Secondary hyperparathyroidism of renal origin: Secondary | ICD-10-CM | POA: Diagnosis not present

## 2017-06-10 DIAGNOSIS — E1122 Type 2 diabetes mellitus with diabetic chronic kidney disease: Secondary | ICD-10-CM | POA: Diagnosis not present

## 2017-06-10 DIAGNOSIS — D631 Anemia in chronic kidney disease: Secondary | ICD-10-CM | POA: Diagnosis not present

## 2017-06-12 DIAGNOSIS — N2581 Secondary hyperparathyroidism of renal origin: Secondary | ICD-10-CM | POA: Diagnosis not present

## 2017-06-12 DIAGNOSIS — R0602 Shortness of breath: Secondary | ICD-10-CM | POA: Diagnosis not present

## 2017-06-12 DIAGNOSIS — N186 End stage renal disease: Secondary | ICD-10-CM | POA: Diagnosis not present

## 2017-06-12 DIAGNOSIS — E1122 Type 2 diabetes mellitus with diabetic chronic kidney disease: Secondary | ICD-10-CM | POA: Diagnosis not present

## 2017-06-12 DIAGNOSIS — D631 Anemia in chronic kidney disease: Secondary | ICD-10-CM | POA: Diagnosis not present

## 2017-06-14 ENCOUNTER — Other Ambulatory Visit: Payer: Self-pay | Admitting: *Deleted

## 2017-06-14 DIAGNOSIS — D631 Anemia in chronic kidney disease: Secondary | ICD-10-CM | POA: Diagnosis not present

## 2017-06-14 DIAGNOSIS — R0602 Shortness of breath: Secondary | ICD-10-CM | POA: Diagnosis not present

## 2017-06-14 DIAGNOSIS — N186 End stage renal disease: Secondary | ICD-10-CM | POA: Diagnosis not present

## 2017-06-14 DIAGNOSIS — Z48812 Encounter for surgical aftercare following surgery on the circulatory system: Secondary | ICD-10-CM | POA: Diagnosis not present

## 2017-06-14 DIAGNOSIS — I132 Hypertensive heart and chronic kidney disease with heart failure and with stage 5 chronic kidney disease, or end stage renal disease: Secondary | ICD-10-CM | POA: Diagnosis not present

## 2017-06-14 DIAGNOSIS — I442 Atrioventricular block, complete: Secondary | ICD-10-CM | POA: Diagnosis not present

## 2017-06-14 DIAGNOSIS — I5042 Chronic combined systolic (congestive) and diastolic (congestive) heart failure: Secondary | ICD-10-CM | POA: Diagnosis not present

## 2017-06-14 DIAGNOSIS — N2581 Secondary hyperparathyroidism of renal origin: Secondary | ICD-10-CM | POA: Diagnosis not present

## 2017-06-14 DIAGNOSIS — E1122 Type 2 diabetes mellitus with diabetic chronic kidney disease: Secondary | ICD-10-CM | POA: Diagnosis not present

## 2017-06-14 NOTE — Patient Outreach (Signed)
[facility]) Care Management  06/14/2017  Oscar Castillo 01-31-1951 [id number]   Telephone Screen  Referral Date: 06/14/17 Referral Source: C3 Renal Referral Referral Reason: Pt struggling with ambulation, might need Hoyer lift. Wife struggles to move pt in home. Staff says, pt is often angry and difficult with his spouse. Insurance: Medicare, Medicaid  Outreach telephone call to patient. HIPAA verified with patient. Patient discussed his medical conditions. He verbalized having bilateral below the knee amputations w/ use of prosthesis. He uses a wheelchair to assist with mobility. He has a history of CHF, COPD, Atrial Fibrillation, and ESRD with HD. He goes to HD on Tuesday, Thursday, and Saturday at the [facility]. Patient stated, he is assist with ADL's. Per patient, he bathes occasionally and his spouse assist him with dressing. When his spouse "rolls" him in the kitchen; he cooks. Patient had several inpatient hospital admissions and emergency department visits in 2018. Patient is active with [facility] (PT, RN). [facility] services and benefits explained to patient and he agreed to services.    Plan: RN CM will send referral to [facility] RN for further in home eval/assessment of care needs and management of chronic conditions. RN CM will send [facility] SW referral for possible assistance with community resources related to personal care services. RN CM advised patient to contact RNCM for any needs or concerns.   Lake Bells, RN, BSN, MHA/MSL, [place] Telephonic Care Manager Coordinator [facility] Direct Phone: [phone] Toll Free: [phone] Fax: [phone]

## 2017-06-15 ENCOUNTER — Encounter: Payer: Self-pay | Admitting: *Deleted

## 2017-06-15 DIAGNOSIS — I132 Hypertensive heart and chronic kidney disease with heart failure and with stage 5 chronic kidney disease, or end stage renal disease: Secondary | ICD-10-CM | POA: Diagnosis not present

## 2017-06-15 DIAGNOSIS — I442 Atrioventricular block, complete: Secondary | ICD-10-CM | POA: Diagnosis not present

## 2017-06-15 DIAGNOSIS — I5042 Chronic combined systolic (congestive) and diastolic (congestive) heart failure: Secondary | ICD-10-CM | POA: Diagnosis not present

## 2017-06-15 DIAGNOSIS — Z48812 Encounter for surgical aftercare following surgery on the circulatory system: Secondary | ICD-10-CM | POA: Diagnosis not present

## 2017-06-15 DIAGNOSIS — E1122 Type 2 diabetes mellitus with diabetic chronic kidney disease: Secondary | ICD-10-CM | POA: Diagnosis not present

## 2017-06-15 DIAGNOSIS — N186 End stage renal disease: Secondary | ICD-10-CM | POA: Diagnosis not present

## 2017-06-16 DIAGNOSIS — D631 Anemia in chronic kidney disease: Secondary | ICD-10-CM | POA: Diagnosis not present

## 2017-06-16 DIAGNOSIS — E1122 Type 2 diabetes mellitus with diabetic chronic kidney disease: Secondary | ICD-10-CM | POA: Diagnosis not present

## 2017-06-16 DIAGNOSIS — N2581 Secondary hyperparathyroidism of renal origin: Secondary | ICD-10-CM | POA: Diagnosis not present

## 2017-06-16 DIAGNOSIS — N186 End stage renal disease: Secondary | ICD-10-CM | POA: Diagnosis not present

## 2017-06-16 DIAGNOSIS — R0602 Shortness of breath: Secondary | ICD-10-CM | POA: Diagnosis not present

## 2017-06-18 ENCOUNTER — Other Ambulatory Visit: Payer: Self-pay | Admitting: *Deleted

## 2017-06-18 DIAGNOSIS — E1122 Type 2 diabetes mellitus with diabetic chronic kidney disease: Secondary | ICD-10-CM | POA: Diagnosis not present

## 2017-06-18 DIAGNOSIS — I132 Hypertensive heart and chronic kidney disease with heart failure and with stage 5 chronic kidney disease, or end stage renal disease: Secondary | ICD-10-CM | POA: Diagnosis not present

## 2017-06-18 DIAGNOSIS — N186 End stage renal disease: Secondary | ICD-10-CM | POA: Diagnosis not present

## 2017-06-18 DIAGNOSIS — I442 Atrioventricular block, complete: Secondary | ICD-10-CM | POA: Diagnosis not present

## 2017-06-18 DIAGNOSIS — Z48812 Encounter for surgical aftercare following surgery on the circulatory system: Secondary | ICD-10-CM | POA: Diagnosis not present

## 2017-06-18 DIAGNOSIS — I5042 Chronic combined systolic (congestive) and diastolic (congestive) heart failure: Secondary | ICD-10-CM | POA: Diagnosis not present

## 2017-06-18 NOTE — Patient Outreach (Signed)
[name]) Care Management  06/18/2017  Oscar Castillo Sep 03, 1950 [id number]   Referral received from telephonic care manager for further evaluation/assessment of medical management.  Noted that member has had multiple admissions (6 admits) and ED visits (2 visits) in 2018, and is having trouble with ambulation.  Per chart, he has history of heart failure, TIA, atrial fibrillation, hepatitis C, diabetes, ESRD with dialysis, and depression.  Call placed to member, unsuccessful.  Unable to leave a message, will follow up next week.  [name], MSN, RN [facility] Care Management  [facility] Manager [phone]

## 2017-06-19 DIAGNOSIS — N186 End stage renal disease: Secondary | ICD-10-CM | POA: Diagnosis not present

## 2017-06-19 DIAGNOSIS — E1122 Type 2 diabetes mellitus with diabetic chronic kidney disease: Secondary | ICD-10-CM | POA: Diagnosis not present

## 2017-06-19 DIAGNOSIS — R0602 Shortness of breath: Secondary | ICD-10-CM | POA: Diagnosis not present

## 2017-06-19 DIAGNOSIS — D631 Anemia in chronic kidney disease: Secondary | ICD-10-CM | POA: Diagnosis not present

## 2017-06-19 DIAGNOSIS — N2581 Secondary hyperparathyroidism of renal origin: Secondary | ICD-10-CM | POA: Diagnosis not present

## 2017-06-21 ENCOUNTER — Ambulatory Visit (INDEPENDENT_AMBULATORY_CARE_PROVIDER_SITE_OTHER): Payer: Medicare Other

## 2017-06-21 DIAGNOSIS — G459 Transient cerebral ischemic attack, unspecified: Secondary | ICD-10-CM | POA: Diagnosis not present

## 2017-06-21 DIAGNOSIS — Z5181 Encounter for therapeutic drug level monitoring: Secondary | ICD-10-CM | POA: Diagnosis not present

## 2017-06-21 DIAGNOSIS — I4891 Unspecified atrial fibrillation: Secondary | ICD-10-CM

## 2017-06-21 LAB — POCT INR: INR: 2.3

## 2017-06-21 NOTE — Patient Instructions (Signed)
Continue same dose of coumadin  1 tablet (2.5mg )  daily. Recheck INR in 2 weeks

## 2017-06-22 ENCOUNTER — Encounter: Payer: Self-pay | Admitting: *Deleted

## 2017-06-22 ENCOUNTER — Other Ambulatory Visit: Payer: Self-pay | Admitting: *Deleted

## 2017-06-22 DIAGNOSIS — E1122 Type 2 diabetes mellitus with diabetic chronic kidney disease: Secondary | ICD-10-CM | POA: Diagnosis not present

## 2017-06-22 DIAGNOSIS — N2581 Secondary hyperparathyroidism of renal origin: Secondary | ICD-10-CM | POA: Diagnosis not present

## 2017-06-22 DIAGNOSIS — D631 Anemia in chronic kidney disease: Secondary | ICD-10-CM | POA: Diagnosis not present

## 2017-06-22 DIAGNOSIS — N186 End stage renal disease: Secondary | ICD-10-CM | POA: Diagnosis not present

## 2017-06-22 DIAGNOSIS — R0602 Shortness of breath: Secondary | ICD-10-CM | POA: Diagnosis not present

## 2017-06-22 NOTE — Patient Outreach (Signed)
[facility]) Care Management  06/22/2017  Oscar Castillo Nov 28, 1950 [id number]   CSW was able to make initial contact with patient today to perform phone assessment, as well as assess and assist with social work needs and services.  CSW introduced self, explained role and types of services provided through [facility] ([place] [facility]).  CSW further explained to patient that CSW works with patient's RNCM, also with [facility], [name]. CSW then explained the reason for the call, indicating that Mrs. Orene Desanctis thought that patient would benefit from social work services and resources to assist with arranging in-home care services.  CSW obtained two HIPAA compliant identifiers from patient, which included patient's name and date of birth. Patient reported that he will need help in the home "at some point", but that he is currently able to perform all activities of daily living independently.  Patient further reported that he is able to prepare his own meals, perform light housekeeping duties, pay bills, grocery shop, etc.  Patient went on to say that he was really just looking for resources for future reference.  CSW was able to confirm that patient has Adult Medicaid, agreeing to mail patient applications for CAPS Forensic scientist) through the [place] and [employer] (Publishing rights manager) through [employer].  CSW will then follow-up with patient in two weeks to ensure that he received the applications, as well as assist with completion, if necessary.  Patient voiced understanding and was agreeable to this plan. [facility] CM Care Plan Problem One     Most Recent Value  Care Plan Problem One  Lack of in-home care services.  Role Documenting the Problem One  Clinical Social Worker  Care Plan for Problem One  Active  [facility] CM Short Term Goal #1   Patient will review applications for PCS  and CAPS, within the next three weeks.  [facility] CM Short Term Goal #1 Start Date  06/22/17  Interventions for Short Term Goal #1  CSW will mail patient applications for PCS and CAPS and assist with completion, if necessary.     Oscar Castillo, BSW, MSW, LCSW  Licensed Education officer, [facility]  Mailing [place] [address] [place], [place] [address] Physical Address-[address], [place], [place] [address] Toll Free Main # [phone] Fax # [phone] Cell # [phone]  Office # [phone] [email or web address]

## 2017-06-22 NOTE — Patient Outreach (Signed)
[facility]) Care Management  06/22/2017  THURL BOEN [birth year] [id number]   2nd attempt made to contact member unsuccessful, HIPAA compliant voice message left.  Will make 3rd attempt to contact tomorrow.  [name], [place], MSN [place] [phone]

## 2017-06-23 ENCOUNTER — Encounter: Payer: Self-pay | Admitting: Internal Medicine

## 2017-06-23 DIAGNOSIS — I5042 Chronic combined systolic (congestive) and diastolic (congestive) heart failure: Secondary | ICD-10-CM | POA: Diagnosis not present

## 2017-06-23 DIAGNOSIS — I132 Hypertensive heart and chronic kidney disease with heart failure and with stage 5 chronic kidney disease, or end stage renal disease: Secondary | ICD-10-CM | POA: Diagnosis not present

## 2017-06-23 DIAGNOSIS — N186 End stage renal disease: Secondary | ICD-10-CM | POA: Diagnosis not present

## 2017-06-23 DIAGNOSIS — I442 Atrioventricular block, complete: Secondary | ICD-10-CM | POA: Diagnosis not present

## 2017-06-23 DIAGNOSIS — Z48812 Encounter for surgical aftercare following surgery on the circulatory system: Secondary | ICD-10-CM | POA: Diagnosis not present

## 2017-06-23 DIAGNOSIS — E1122 Type 2 diabetes mellitus with diabetic chronic kidney disease: Secondary | ICD-10-CM | POA: Diagnosis not present

## 2017-06-23 NOTE — Patient Outreach (Signed)
Request received from Joanna Saporito, LCSW to mail patient personal care resources.  Information mailed today. 

## 2017-06-24 ENCOUNTER — Other Ambulatory Visit: Payer: Self-pay | Admitting: *Deleted

## 2017-06-24 ENCOUNTER — Encounter: Payer: Self-pay | Admitting: *Deleted

## 2017-06-24 DIAGNOSIS — I4891 Unspecified atrial fibrillation: Secondary | ICD-10-CM | POA: Diagnosis not present

## 2017-06-24 DIAGNOSIS — N186 End stage renal disease: Secondary | ICD-10-CM | POA: Diagnosis not present

## 2017-06-24 DIAGNOSIS — E1122 Type 2 diabetes mellitus with diabetic chronic kidney disease: Secondary | ICD-10-CM | POA: Diagnosis not present

## 2017-06-24 DIAGNOSIS — R0602 Shortness of breath: Secondary | ICD-10-CM | POA: Diagnosis not present

## 2017-06-24 DIAGNOSIS — D631 Anemia in chronic kidney disease: Secondary | ICD-10-CM | POA: Diagnosis not present

## 2017-06-24 DIAGNOSIS — N2581 Secondary hyperparathyroidism of renal origin: Secondary | ICD-10-CM | POA: Diagnosis not present

## 2017-06-24 DIAGNOSIS — Z5181 Encounter for therapeutic drug level monitoring: Secondary | ICD-10-CM | POA: Diagnosis not present

## 2017-06-24 NOTE — Patient Outreach (Signed)
[facility]) Care Management  06/24/2017  CORDARIOUS ZEEK 19-Sep-1950 [id number]   3rd attempt made to contact member for telephone assessment, unsuccessful.  Will send outreach letter, if no response within 10 business days, will close case.  [name], MSN, RN [facility] Care Management  [facility] Manager [phone]

## 2017-06-25 ENCOUNTER — Ambulatory Visit: Payer: Self-pay | Admitting: *Deleted

## 2017-06-25 DIAGNOSIS — N186 End stage renal disease: Secondary | ICD-10-CM | POA: Diagnosis not present

## 2017-06-25 DIAGNOSIS — N2581 Secondary hyperparathyroidism of renal origin: Secondary | ICD-10-CM | POA: Diagnosis not present

## 2017-06-25 DIAGNOSIS — D631 Anemia in chronic kidney disease: Secondary | ICD-10-CM | POA: Diagnosis not present

## 2017-06-25 DIAGNOSIS — E1122 Type 2 diabetes mellitus with diabetic chronic kidney disease: Secondary | ICD-10-CM | POA: Diagnosis not present

## 2017-06-25 DIAGNOSIS — R0602 Shortness of breath: Secondary | ICD-10-CM | POA: Diagnosis not present

## 2017-06-25 DIAGNOSIS — Z992 Dependence on renal dialysis: Secondary | ICD-10-CM | POA: Diagnosis not present

## 2017-06-26 DIAGNOSIS — D631 Anemia in chronic kidney disease: Secondary | ICD-10-CM | POA: Diagnosis not present

## 2017-06-26 DIAGNOSIS — D689 Coagulation defect, unspecified: Secondary | ICD-10-CM | POA: Diagnosis not present

## 2017-06-26 DIAGNOSIS — N186 End stage renal disease: Secondary | ICD-10-CM | POA: Diagnosis not present

## 2017-06-26 DIAGNOSIS — N2581 Secondary hyperparathyroidism of renal origin: Secondary | ICD-10-CM | POA: Diagnosis not present

## 2017-06-28 ENCOUNTER — Ambulatory Visit (INDEPENDENT_AMBULATORY_CARE_PROVIDER_SITE_OTHER): Payer: Medicare Other | Admitting: Orthopedic Surgery

## 2017-06-28 ENCOUNTER — Encounter (INDEPENDENT_AMBULATORY_CARE_PROVIDER_SITE_OTHER): Payer: Self-pay | Admitting: Orthopedic Surgery

## 2017-06-28 VITALS — Ht 70.0 in | Wt 205.0 lb

## 2017-06-28 DIAGNOSIS — Z89512 Acquired absence of left leg below knee: Secondary | ICD-10-CM | POA: Diagnosis not present

## 2017-06-28 DIAGNOSIS — I6389 Other cerebral infarction: Secondary | ICD-10-CM

## 2017-06-28 DIAGNOSIS — Z89511 Acquired absence of right leg below knee: Secondary | ICD-10-CM

## 2017-06-28 DIAGNOSIS — E11610 Type 2 diabetes mellitus with diabetic neuropathic arthropathy: Secondary | ICD-10-CM | POA: Diagnosis not present

## 2017-06-28 MED ORDER — GABAPENTIN 300 MG PO CAPS
300.0000 mg | ORAL_CAPSULE | Freq: Three times a day (TID) | ORAL | 3 refills | Status: DC
Start: 2017-06-28 — End: 2017-12-02

## 2017-06-28 NOTE — Progress Notes (Signed)
Office Visit Note   Patient: Oscar Castillo           Date of Birth: 10-May-1951           MRN: [id number] Visit Date: 06/28/2017              Requested by: [name], MD [place],  [id number] PCP: [name], MD  Chief Complaint  Patient presents with  . Left Leg - Follow-up    Bilat BKA  . Right Leg - Follow-up      HPI: Patient is a 66 year old gentleman who is seen for evaluation for pain in the residual limbs with bilateral transtibial amputations.  Patient has a knee flexion contracture he just had his sockets modified with [employer].  Patient states that he cannot sleep at night he has pain even to lightly touch his legs.  Patient is diabetic with end-stage renal disease on dialysis Tuesday Thursday Saturday he is on Coumadin.  Assessment & Plan: Visit Diagnoses:  1. Diabetic neurogenic arthropathy ([place])   2. S/P BKA (below knee amputation) bilateral ([place])     Plan: We will start him on Neurontin 300 mg nightly and increase up to 3 times a day as needed.  He will slowly increase the amount of time that he is wearing his prosthesis.  Follow-Up Instructions: Return in about 4 weeks (around 07/26/2017).   Ortho Exam  Patient is alert, oriented, no adenopathy, well-dressed, normal affect, normal respiratory effort. Examination patient is ambulating in a wheelchair.  He has a short transtibial ectasia in the right and a mid-level amputation on the left.  Patient has a fixed flexion contracture of both knees.  There is no effusion in either knee there is no tenderness to palpation along the joint lines bilaterally.  Patient has no redness no cellulitis no swelling no signs of infection.  Patient's legs are cool to the touch he has no open ulcers there is a little bit of callus on the end of the left transtibial amputation.  Patient is a prosthetic sockets are new and well fit and had good padding.  There is no evidence of any pressure points on his  legs no areas of pending skin breakdown.  Patient does have hypersensitivity to light touch consistent more with a neuropathy pain.  Imaging: No results found. No images are attached to the encounter.  Labs: Lab Results  Component Value Date   HGBA1C 7.1 (H) 02/10/2017   HGBA1C 6.4 (H) 09/15/2016   HGBA1C 6.2 12/11/2015   ESRSEDRATE 27 (H) 05/09/2013   CRP 6.7 (H) 05/09/2013   REPTSTATUS 05/09/2017 FINAL 05/06/2017   GRAMSTAIN  11/09/2016    FEW WBC PRESENT,BOTH PMN AND MONONUCLEAR NO ORGANISMS SEEN    CULT (A) 05/06/2017    BACILLUS SPECIES Standardized susceptibility testing for this organism is not available.    LABORGA STAPHYLOCOCCUS AUREUS 11/09/2016    @LABSALLVALUES (HGBA1)@  Body mass index is 29.41 kg/m.  Orders:  No orders of the defined types were placed in this encounter.  Meds ordered this encounter  Medications  . gabapentin (NEURONTIN) 300 MG capsule    Sig: Take 1 capsule (300 mg total) by mouth 3 (three) times daily. 3 times a day when necessary neuropathy pain    Dispense:  90 capsule    Refill:  3     Procedures: No procedures performed  Clinical Data: No additional findings.  ROS:  All other systems negative, except as  noted in the HPI. Review of Systems  Objective: Vital Signs: Ht 5\' 10"  (1.778 m)   Wt 205 lb (93 kg)   BMI 29.41 kg/m   Specialty Comments:  No specialty comments available.  PMFS History: Patient Active Problem List   Diagnosis Date Noted  . Sacral ulcer ([place]) 05/07/2017  . CHB (complete heart block) ([place]) 04/29/2017  . Heart block AV third degree ([place]) 04/29/2017  . Complete heart block ([place])   . PAD (peripheral artery disease) ([place])   . General weakness 02/10/2017  . Gait disorder 02/10/2017  . MSSA bacteremia   . Sepsis ([place]) 11/08/2016  . Encounter for therapeutic drug monitoring 10/07/2016  . Diabetes mellitus with complication ([place])   . Anemia due to stage 5 chronic kidney disease ([place])   . Melena  09/26/2016  . Pressure injury of skin 09/26/2016  . Benign neoplasm of descending colon   . Gastroesophageal reflux disease with esophagitis   . Duodenal ulcer without hemorrhage or perforation   . Blood loss anemia 09/15/2016  . Paroxysmal atrial fibrillation ([place]) 09/15/2016  . Heme positive stool 09/15/2016  . RUQ pain 07/12/2016  . Hyperkalemia 07/01/2016  . Anemia 12/31/2015  . Glaucoma 12/27/2015  . LBBB (left bundle branch block)   . Rash 11/15/2015  . Cramping of hands 11/15/2015  . Liver fibrosis 10/07/2015  . Diarrhea 08/14/2014  . Dehydration 10/02/2013  . Fever 10/02/2013  . Altered mental status 10/01/2013  . Encephalopathy, toxic 10/01/2013  . Encephalopathy, metabolic 64/40/3474  . History of stroke 09/28/2013  . FTT (failure to thrive) in adult 09/28/2013  . Acute confusional state 09/28/2013  . Ulcer of sacral region, stage 3 ([place]) 09/26/2013  . S/P BKA (below knee amputation) bilateral ([place]) 09/08/2013  . ESRD on hemodialysis ([place]) 05/09/2013  . TIA (transient ischemic attack) 02/20/2013  . Acute blood loss anemia 10/28/2012  . Acute on chronic combined systolic and diastolic CHF (congestive heart failure) ([place]) 10/28/2012  . Constipation 10/28/2012  . GI bleed 10/27/2012  . Mass in rectum 10/27/2012  . Type 2 diabetes mellitus with chronic kidney disease on chronic dialysis, with long-term current use of insulin ([place]) 09/08/2012  . ESRD (end stage renal disease) ([place]) 09/08/2012  . LVH (left ventricular hypertrophy)-severe concentric 06/13/2012  . Hyperlipidemia 01/30/2011  . CHOLELITHIASIS 08/01/2010  . BENIGN PROSTATIC HYPERTROPHY 08/01/2010  . CEREBROVASCULAR ACCIDENT, HX OF 08/06/2009  . Depression 03/18/2009  . NEPHROLITHIASIS, HX OF 03/18/2009  . Morbid obesity ([place]) 03/25/2007  . Hypertensive heart disease with CHF (congestive heart failure) ([place]) 03/25/2007  . GERD 03/25/2007  . Chronic hepatitis C without hepatic coma ([place]) 03/25/2007   Past  Medical History:  Diagnosis Date  . Anemia   . Antral ulcer 2014   small  . BENIGN PROSTATIC HYPERTROPHY   . Bipolar disorder ([place])    "sometimes" (10/07/2016)  . CHOLELITHIASIS   . Chronic combined systolic and diastolic CHF (congestive heart failure) ([place])   . Complication of anesthesia    wife states pt had trouble waking up with in Nov., 2014  . CVA (cerebral vascular accident) ([place]) 07/2007  . DEPRESSION   . DIABETES MELLITUS, TYPE II    diet control  . ERECTILE DYSFUNCTION   . ESRD on hemodialysis ([place])    ESRD due to DM/HTN. Started dialysis in November 2013.  HD TTS at [facility] [address].  [name] GERD   . GI bleed    due to gastritis, discharged 10/02/16/notes 10/07/2016  .  Hepatitis C    C - has been treated  . History of Clostridium difficile   . History of kidney stones   . HYPERTENSION   . LBBB (left bundle branch block)   . Morbid obesity ([place])   . PAF (paroxysmal atrial fibrillation) ([place])    a. Dx 12/2015.    Family History  Problem Relation Age of Onset  . Diabetes Mother   . Hypertension Mother   . Heart attack Father   . Hypertension Father   . Coronary artery disease Other     Past Surgical History:  Procedure Laterality Date  . AMPUTATION Left 05/12/2013   Procedure: AMPUTATION RAY;  Surgeon: Newt Minion, MD;  Location: [place];  Service: Orthopedics;  Laterality: Left;  Left Foot 1st Ray Amputation  . AMPUTATION Left 06/09/2013   Procedure: AMPUTATION BELOW KNEE;  Surgeon: Newt Minion, MD;  Location: [place];  Service: Orthopedics;  Laterality: Left;  Left Below Knee Amputation and removal proximal screws IM tibial nail  . AMPUTATION Right 09/08/2013   Procedure: AMPUTATION BELOW KNEE;  Surgeon: Newt Minion, MD;  Location: [place];  Service: Orthopedics;  Laterality: Right;  Right Below Knee Amputation  . AMPUTATION Right 10/11/2013   Procedure: AMPUTATION BELOW KNEE;  Surgeon: Newt Minion, MD;  Location: [place];  Service: Orthopedics;  Laterality:  Right;  Right Below Knee Amputation Revision  . AV FISTULA PLACEMENT  06/14/2012   Procedure: ARTERIOVENOUS (AV) FISTULA CREATION;  Surgeon: Angelia Mould, MD;  Location: [facility] OR;  Service: Vascular;  Laterality: Left;  Left basilic vein transposition with fistula.  [name] BASCILIC VEIN TRANSPOSITION Left 02/17/2017   Procedure: LEFT 1ST STAGE BRACHIAL VEIN TRANSPOSITION;  Surgeon: Conrad Twin Brooks, MD;  Location: [place];  Service: Vascular;  Laterality: Left;  . BASCILIC VEIN TRANSPOSITION Left 04/21/2017   Procedure: LEFT 2ND STAGE BRACHIAL VEIN TRANSPOSITION;  Surgeon: Conrad Baker, MD;  Location: [place];  Service: Vascular;  Laterality: Left;  . COLONOSCOPY N/A 10/28/2012   Procedure: COLONOSCOPY;  Surgeon: Jeryl Columbia, MD;  Location: [facility] ENDOSCOPY;  Service: Endoscopy;  Laterality: N/A;  . COLONOSCOPY N/A 11/02/2012   Procedure: COLONOSCOPY;  Surgeon: Cleotis Nipper, MD;  Location[facility]COPY;  Service: Endoscopy;  Laterality: N/A;  . COLONOSCOPY N/A 11/03/2012   Procedure: COLONOSCOPY;  Surgeon: Cleotis Nipper, MD;  Location: [facility] ENDOSCOPY;  Service: Endoscopy;  Laterality: N/A;  . COLONOSCOPY N/A 09/16/2016   Procedure: COLONOSCOPY;  Surgeon: Jerene Bears, MD;  Location: [facility] ENDOSCOPY;  Service: Gastroenterology;  Laterality: N/A;  . ENTEROSCOPY N/A 11/08/2012   Procedure: ENTEROSCOPY;  Surgeon: Wonda Horner, MD;  Location: [facility] ENDOSCOPY;  Service: Endoscopy;  Laterality: N/A;  . ESOPHAGOGASTRODUODENOSCOPY N/A 11/02/2012   Procedure: ESOPHAGOGASTRODUODENOSCOPY (EGD);  Surgeon: Cleotis Nipper, MD;  Location: [facility] ENDOSCOPY;  Service: Endoscopy;  Laterality: N/A;  . ESOPHAGOGASTRODUODENOSCOPY (EGD) WITH PROPOFOL N/A 09/16/2016   Procedure: ESOPHAGOGASTRODUODENOSCOPY (EGD) WITH PROPOFOL;  Surgeon: Jerene Bears, MD;  Location: [facility] ENDOSCOPY;  Service: Gastroenterology;  Laterality: N/A;  . EXCHANGE OF A DIALYSIS CATHETER Left 11/13/2016   Procedure: EXCHANGE OF A DIALYSIS CATHETER;  Surgeon: Elam Dutch, MD;  Location: [place];  Service: Vascular;  Laterality: Left;  . EYE SURGERY Left    to remove scar tissue  . GIVENS CAPSULE STUDY N/A 11/04/2012   Procedure: GIVENS CAPSULE STUDY;  Surgeon: Cleotis Nipper, MD;  Location: [facility] ENDOSCOPY;  Service: Endoscopy;  Laterality: N/A;  .  GIVENS CAPSULE STUDY N/A 09/29/2016   Procedure: GIVENS CAPSULE STUDY;  Surgeon: Manus Gunning, MD;  Location: [place];  Service: Gastroenterology;  Laterality: N/A;  try to keep pt up in bedside chair as much as possible during the study.    [name] HARDWARE REMOVAL Left 06/09/2013   Procedure: HARDWARE REMOVAL;  Surgeon: Newt Minion, MD;  Location: [place];  Service: Orthopedics;  Laterality: Left;  Left Below Knee Amputation  and Removal proximal screws IM tibial nail  . HEMATOMA EVACUATION Left 04/29/2017   Procedure: EVACUATION HEMATOMA LEFT ARM;  Surgeon: Conrad [place], MD;  Location: [place];  Service: Vascular;  Laterality: Left;  . INSERTION OF DIALYSIS CATHETER N/A 11/09/2016   Procedure: INSERTION OF TEMPORARY DIALYSIS CATHETER;  Surgeon: Elam Dutch, MD;  Location: [place];  Service: Vascular;  Laterality: N/A;  . IR GENERIC HISTORICAL  09/27/2016   IR US GUIDE VASC ACCESS RIGHT 09/27/2016 Aletta Edouard, MD [facility]  . IR GENERIC HISTORICAL  09/27/2016   IR FLUORO GUIDE CV LINE RIGHT 09/27/2016 Aletta Edouard, MD [facility]  . LIGATION OF ARTERIOVENOUS  FISTULA Left 11/09/2016   Procedure: LIGATION OF ARTERIOVENOUS  FISTULA;  Surgeon: Elam Dutch, MD;  Location: [place];  Service: Vascular;  Laterality: Left;  . NEPHRECTOMY     partial RR  . ORIF FIBULA FRACTURE Left 09/09/2012   Procedure: OPEN REDUCTION INTERNAL FIXATION (ORIF) FIBULA FRACTURE;  Surgeon: Johnny Bridge, MD;  Location: [place];  Service: Orthopedics;  Laterality: Left;  . PACEMAKER IMPLANT N/A 04/30/2017   Procedure: PACEMAKER IMPLANT;  Surgeon: Evans Lance, MD;  Location: [place] CV LAB;  Service: Cardiovascular;   Laterality: N/A;  . TEMPORARY PACEMAKER N/A 04/29/2017   Procedure: TEMPORARY PACEMAKER;  Surgeon: Lorretta Harp, MD;  Location: [place] CV LAB;  Service: Cardiovascular;  Laterality: N/A;  . TIBIA IM NAIL INSERTION Left 09/09/2012   Procedure: INTRAMEDULLARY (IM) NAIL TIBIAL;  Surgeon: Johnny Bridge, MD;  Location: [place];  Service: Orthopedics;  Laterality: Left;  left tibial nail and open reduction internal fixation left fibula fracture  . UPPER EXTREMITY VENOGRAPHY N/A 12/14/2016   Procedure: Bilateral Upper Extremity Venography and Central Venography;  Surgeon: Conrad [place], MD;  Location: [place] CV LAB;  Service: Cardiovascular;  Laterality: N/A;   Social History   Occupational History  . Occupation: disabled due to stroke    Employer: RETIRED  Tobacco Use  . Smoking status: Never Smoker  . Smokeless tobacco: Never Used  Substance and Sexual Activity  . Alcohol use: No  . Drug use: No  . Sexual activity: Yes    Birth control/protection: None

## 2017-06-28 NOTE — H&P (View-Only) (Signed)
Postoperative Access Visit   History of Present Illness   Oscar Castillo is a 66 y.o. ([birth year]) male who presents for postoperative follow-up for: leftsecond stage brachial vein transposition(Date: 04/21/17). This patient is on chronic anticoagulation and developed a large hematoma that was spontaneously decompressing. I evacuated the hematoma on 04/29/17. Intra-operatively this was complicated by complete heart block. The patient required temporary pacing to keep his heart functioning. Eventually he had R SCV PPM placed on 04/30/17.  The patient's wounds are healed.  The left fistula was found recently to be lacking a thrill. The patient notes nosteal symptoms. The patient is able to complete their activities of daily living. The patient's current symptoms are: none.  Past Medical History:  Diagnosis Date  . Anemia   . Antral ulcer 2014   small  . BENIGN PROSTATIC HYPERTROPHY   . Bipolar disorder ([place])    "sometimes" (10/07/2016)  . CHOLELITHIASIS   . Chronic combined systolic and diastolic CHF (congestive heart failure) ([place])   . Complication of anesthesia    wife states pt had trouble waking up with in Nov., 2014  . CVA (cerebral vascular accident) ([place]) 07/2007  . DEPRESSION   . DIABETES MELLITUS, TYPE II    diet control  . ERECTILE DYSFUNCTION   . ESRD on hemodialysis ([place])    ESRD due to DM/HTN. Started dialysis in November 2013.  HD TTS at [facility] on [address].  [name] GERD   . GI bleed    due to gastritis, discharged 10/02/16/notes 10/07/2016  . Hepatitis C    C - has been treated  . History of Clostridium difficile   . History of kidney stones   . HYPERTENSION   . LBBB (left bundle branch block)   . Morbid obesity ([place])   . PAF (paroxysmal atrial fibrillation) ([place])    a. Dx 12/2015.    Past Surgical History:  Procedure Laterality Date  . AMPUTATION Left 05/12/2013   Procedure: AMPUTATION RAY;  Surgeon: Newt Minion, MD;  Location: [place];   Service: Orthopedics;  Laterality: Left;  Left Foot 1st Ray Amputation  . AMPUTATION Left 06/09/2013   Procedure: AMPUTATION BELOW KNEE;  Surgeon: Newt Minion, MD;  Location: [place];  Service: Orthopedics;  Laterality: Left;  Left Below Knee Amputation and removal proximal screws IM tibial nail  . AMPUTATION Right 09/08/2013   Procedure: AMPUTATION BELOW KNEE;  Surgeon: Newt Minion, MD;  Location: [place];  Service: Orthopedics;  Laterality: Right;  Right Below Knee Amputation  . AMPUTATION Right 10/11/2013   Procedure: AMPUTATION BELOW KNEE;  Surgeon: Newt Minion, MD;  Location: [place];  Service: Orthopedics;  Laterality: Right;  Right Below Knee Amputation Revision  . AV FISTULA PLACEMENT  06/14/2012   Procedure: ARTERIOVENOUS (AV) FISTULA CREATION;  Surgeon: Angelia Mould, MD;  Location: [facility] OR;  Service: Vascular;  Laterality: Left;  Left basilic vein transposition with fistula.  [name] BASCILIC VEIN TRANSPOSITION Left 02/17/2017   Procedure: LEFT 1ST STAGE BRACHIAL VEIN TRANSPOSITION;  Surgeon: Conrad Manns Harbor, MD;  Location: [place];  Service: Vascular;  Laterality: Left;  . BASCILIC VEIN TRANSPOSITION Left 04/21/2017   Procedure: LEFT 2ND STAGE BRACHIAL VEIN TRANSPOSITION;  Surgeon: Conrad Britton, MD;  Location: [place];  Service: Vascular;  Laterality: Left;  . COLONOSCOPY N/A 10/28/2012   Procedure: COLONOSCOPY;  Surgeon: Jeryl Columbia, MD;  Location: [facility] ENDOSCOPY;  Service: Endoscopy;  Laterality: N/A;  . COLONOSCOPY N/A  11/02/2012   Procedure: COLONOSCOPY;  Surgeon: Cleotis Nipper, MD;  Location: [facility] ENDOSCOPY;  Service: Endoscopy;  Laterality: N/A;  . COLONOSCOPY N/A 11/03/2012   Procedure: COLONOSCOPY;  Surgeon: Cleotis Nipper, MD;  Location: [facility] ENDOSCOPY;  Service: Endoscopy;  Laterality: N/A;  . COLONOSCOPY N/A 09/16/2016   Procedure: COLONOSCOPY;  Surgeon: Jerene Bears, MD;  Location: [facility] ENDOSCOPY;  Service: Gastroenterology;  Laterality: N/A;  . ENTEROSCOPY N/A 11/08/2012   Procedure:  ENTEROSCOPY;  Surgeon: Wonda Horner, MD;  Location: [facility] ENDOSCOPY;  Service: Endoscopy;  Laterality: N/A;  . ESOPHAGOGASTRODUODENOSCOPY N/A 11/02/2012   Procedure: ESOPHAGOGASTRODUODENOSCOPY (EGD);  Surgeon: Cleotis Nipper, MD;  Location: [facility] ENDOSCOPY;  Service: Endoscopy;  Laterality: N/A;  . ESOPHAGOGASTRODUODENOSCOPY (EGD) WITH PROPOFOL N/A 09/16/2016   Procedure: ESOPHAGOGASTRODUODENOSCOPY (EGD) WITH PROPOFOL;  Surgeon: Jerene Bears, MD;  Location: [facility] ENDOSCOPY;  Service: Gastroenterology;  Laterality: N/A;  . EXCHANGE OF A DIALYSIS CATHETER Left 11/13/2016   Procedure: EXCHANGE OF A DIALYSIS CATHETER;  Surgeon: Elam Dutch, MD;  Location: [place];  Service: Vascular;  Laterality: Left;  . EYE SURGERY Left    to remove scar tissue  . GIVENS CAPSULE STUDY N/A 11/04/2012   Procedure: GIVENS CAPSULE STUDY;  Surgeon: Cleotis Nipper, MD;  Location: [facility] ENDOSCOPY;  Service: Endoscopy;  Laterality: N/A;  . GIVENS CAPSULE STUDY N/A 09/29/2016   Procedure: GIVENS CAPSULE STUDY;  Surgeon: Manus Gunning, MD;  Location: [place];  Service: Gastroenterology;  Laterality: N/A;  try to keep pt up in bedside chair as much as possible during the study.    [name] HARDWARE REMOVAL Left 06/09/2013   Procedure: HARDWARE REMOVAL;  Surgeon: Newt Minion, MD;  Location: [place];  Service: Orthopedics;  Laterality: Left;  Left Below Knee Amputation  and Removal proximal screws IM tibial nail  . HEMATOMA EVACUATION Left 04/29/2017   Procedure: EVACUATION HEMATOMA LEFT ARM;  Surgeon: Conrad Blythewood, MD;  Location: [place];  Service: Vascular;  Laterality: Left;  . INSERTION OF DIALYSIS CATHETER N/A 11/09/2016   Procedure: INSERTION OF TEMPORARY DIALYSIS CATHETER;  Surgeon: Elam Dutch, MD;  Location: [place];  Service: Vascular;  Laterality: N/A;  . IR GENERIC HISTORICAL  09/27/2016   IR US GUIDE VASC ACCESS RIGHT 09/27/2016 Aletta Edouard, MD [facility]INTER[facility]  . IR GENERIC HISTORICAL  09/27/2016   IR FLUORO GUIDE CV LINE  RIGHT 09/27/2016 Aletta Edouard, MD [facility]  . LIGATION OF ARTERIOVENOUS  FISTULA Left 11/09/2016   Procedure: LIGATION OF ARTERIOVENOUS  FISTULA;  Surgeon: Elam Dutch, MD;  Location: [place];  Service: Vascular;  Laterality: Left;  . NEPHRECTOMY     partial RR  . ORIF FIBULA FRACTURE Left 09/09/2012   Procedure: OPEN REDUCTION INTERNAL FIXATION (ORIF) FIBULA FRACTURE;  Surgeon: Johnny Bridge, MD;  Location: [place];  Service: Orthopedics;  Laterality: Left;  . PACEMAKER IMPLANT N/A 04/30/2017   Procedure: PACEMAKER IMPLANT;  Surgeon: Evans Lance, MD;  Location: [place] CV LAB;  Service: Cardiovascular;  Laterality: N/A;  . TEMPORARY PACEMAKER N/A 04/29/2017   Procedure: TEMPORARY PACEMAKER;  Surgeon: Lorretta Harp, MD;  Location: [place] CV LAB;  Service: Cardiovascular;  Laterality: N/A;  . TIBIA IM NAIL INSERTION Left 09/09/2012   Procedure: INTRAMEDULLARY (IM) NAIL TIBIAL;  Surgeon: Johnny Bridge, MD;  Location: [place];  Service: Orthopedics;  Laterality: Left;  left tibial nail and open reduction internal fixation left fibula fracture  . UPPER EXTREMITY VENOGRAPHY  N/A 12/14/2016   Procedure: Bilateral Upper Extremity Venography and Central Venography;  Surgeon: Conrad Izard, MD;  Location: [place] CV LAB;  Service: Cardiovascular;  Laterality: N/A;    Social History   Socioeconomic History  . Marital status: Married    Spouse name: Not on file  . Number of children: 2  . Years of education: 68  . Highest education level: Not on file  Social Needs  . Financial resource strain: Not on file  . Food insecurity - worry: Not on file  . Food insecurity - inability: Not on file  . Transportation needs - medical: Not on file  . Transportation needs - non-medical: Not on file  Occupational History  . Occupation: disabled due to stroke    Employer: RETIRED  Tobacco Use  . Smoking status: Never Smoker  . Smokeless tobacco: Never Used  Substance and Sexual  Activity  . Alcohol use: No  . Drug use: No  . Sexual activity: Yes    Birth control/protection: None  Other Topics Concern  . 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 Diabetes Mother   . Hypertension Mother   . Heart attack Father   . Hypertension Father   . Coronary artery disease Other     Current Outpatient Medications  Medication Sig Dispense Refill  . acetaminophen (TYLENOL) 500 MG tablet Take 500 mg by mouth every 6 (six) hours as needed for mild pain.    [name] amiodarone (PACERONE) 200 MG tablet TAKE 1 TABLET BY MOUTH DAILY, START THIS ON 11/06/16 (Patient taking differently: TAKE 200 mg TABLET BY MOUTH DAILY, START THIS ON 11/06/16) 90 tablet 3  . atorvastatin (LIPITOR) 40 MG tablet TAKE 1 TABLET(40 MG) BY MOUTH DAILY AT 6 PM 30 tablet 4  . calcium acetate (PHOSLO) 667 MG capsule Take 1,334 mg by mouth 3 (three) times daily with meals.     . gabapentin (NEURONTIN) 300 MG capsule Take 1 capsule (300 mg total) by mouth 3 (three) times daily. 3 times a day when necessary neuropathy pain 90 capsule 3  . latanoprost (XALATAN) 0.005 % ophthalmic solution Place 1 drop into both eyes at bedtime.     . pantoprazole (PROTONIX) 40 MG tablet Take 1 tablet (40 mg total) by mouth 2 (two) times daily before a meal. 60 tablet 11  . QUEtiapine (SEROQUEL) 25 MG tablet Take 0.5 tablets (12.5 mg total) by mouth at bedtime. 60 tablet 8  . senna (SENOKOT) 8.6 MG TABS tablet Take 1 tablet (8.6 mg total) by mouth daily. 120 each 0  . SENSIPAR 30 MG tablet Take 30 mg by mouth every evening.     . silver sulfADIAZINE (SILVADENE) 1 % cream Apply 1 application topically daily as needed (irratation). 50 g   . sodium phosphate (FLEET) 7-19 GM/118ML ENEM Place 133 mLs (1 enema total) rectally daily as needed for severe constipation. 30 enema 0  . sorbitol 70 % SOLN Take 30 mLs  by mouth 2 (two) times daily. 473 mL 0  . SSD 1 % cream APPLY EXTERNALLY TO THE AFFECTED AREA DAILY 50 g 0  . traMADol (ULTRAM) 50 MG tablet Take 50 mg by mouth every 6 (six) hours as needed for moderate pain.    [name] warfarin (COUMADIN) 2.5 MG tablet Take as directed by Anticoagulation Clinic  40 tablet 3   No current facility-administered medications for this visit.      Allergies  Allergen Reactions  . No Known Allergies     REVIEW OF SYSTEMS (negative unless checked):   Cardiac:  []  Chest pain or chest pressure? []  Shortness of breath upon activity? []  Shortness of breath when lying flat? []  Irregular heart rhythm?  Vascular:  []  Pain in calf, thigh, or hip brought on by walking? []  Pain in feet at night that wakes you up from your sleep? []  Blood clot in your veins? []  Leg swelling?  Pulmonary:  []  Oxygen at home? []  Productive cough? []  Wheezing?  Neurologic:  []  Sudden weakness in arms or legs? []  Sudden numbness in arms or legs? []  Sudden onset of difficult speaking or slurred speech? []  Temporary loss of vision in one eye? []  Problems with dizziness?  Gastrointestinal:  []  Blood in stool? []  Vomited blood?  Genitourinary:  []  Burning when urinating? []  Blood in urine? [x]  end stage renal disease-HD: T/R/S  Psychiatric:  []  Major depression  Hematologic:  []  Bleeding problems? []  Problems with blood clotting?  Dermatologic:  []  Rashes or ulcers?  Constitutional:  []  Fever or chills?  Ear/Nose/Throat:  []  Change in hearing? []  Nose bleeds? []  Sore throat?  Musculoskeletal:  []  Back pain? []  Joint pain? []  Muscle pain?   Physical Examination   Vitals:   07/02/17 1036  BP: 92/71  Pulse: 79  Resp: 20  Temp: (!) 97.3 F (36.3 C)  TempSrc: Oral  Weight: 205 lb (93 kg)  Height: 5\' 10"  (1.778 m)   Pulmonary Sym exp, good air movt, CTAB, no rales, rhonchi, & wheezing  Cardiac RRR, Nl S1, S2, no Murmurs, rubs or gallops   leftarm  Incision is healed, both hands are cold, hand grip is 4/5, sensation in digits is intact, no thrill or bruit     Medical Decision Making   Oscar Castillo is a 66 y.o. (02/07/1951) male who presents s/p leftsecond stage brachial vein transpositioncomplicated by bleeding and thrombosis, s/p L arm hematoma evacuation, s/p R PPM   Unfortunately fistula has occluded, which is not unexpected given need to revise the fistula and smaller central segment in target vein.  Will try of LUA AVG next.  This may also be a change if the axillary vein had not dilated appropriately.  Risk, benefits, and alternatives to access surgery were discussed.    The patient is aware the risks include but are not limited to: bleeding, infection, steal syndrome, nerve damage, ischemic monomelic neuropathy, thrombosis, failure to mature, complications related to venous hypertension, need for additional procedures, death and stroke.    The patient agrees to proceed forward with the procedure.  Pt is scheduled for 12 DEC 18  Thank you for allowing [place] to participate in this patient's care.   Adele Barthel, MD, FACS [facility] [place] Office: [phone] Pager: [phone]

## 2017-06-28 NOTE — Progress Notes (Signed)
Postoperative Access Visit   History of Present Illness   Oscar Castillo is a 66 y.o. (1951/05/02) male who presents for postoperative follow-up for: leftsecond stage brachial vein transposition(Date: 04/21/17). This patient is on chronic anticoagulation and developed a large hematoma that was spontaneously decompressing. I evacuated the hematoma on 04/29/17. Intra-operatively this was complicated by complete heart block. The patient required temporary pacing to keep his heart functioning. Eventually he had R SCV PPM placed on 04/30/17.  The patient's wounds are healed.  The left fistula was found recently to be lacking a thrill. The patient notes nosteal symptoms. The patient is able to complete their activities of daily living. The patient's current symptoms are: none.  Past Medical History:  Diagnosis Date  . Anemia   . Antral ulcer 2014   small  . BENIGN PROSTATIC HYPERTROPHY   . Bipolar disorder ([place])    "sometimes" (10/07/2016)  . CHOLELITHIASIS   . Chronic combined systolic and diastolic CHF (congestive heart failure) ([place])   . Complication of anesthesia    wife states pt had trouble waking up with in Nov., 2014  . CVA (cerebral vascular accident) ([place]) 07/2007  . DEPRESSION   . DIABETES MELLITUS, TYPE II    diet control  . ERECTILE DYSFUNCTION   . ESRD on hemodialysis ([place])    ESRD due to DM/HTN. Started dialysis in November 2013.  HD TTS at [facility] on [address].  [name] GERD   . GI bleed    due to gastritis, discharged 10/02/16/notes 10/07/2016  . Hepatitis C    C - has been treated  . History of Clostridium difficile   . History of kidney stones   . HYPERTENSION   . LBBB (left bundle branch block)   . Morbid obesity ([place])   . PAF (paroxysmal atrial fibrillation) ([place])    a. Dx 12/2015.    Past Surgical History:  Procedure Laterality Date  . AMPUTATION Left 05/12/2013   Procedure: AMPUTATION RAY;  Surgeon: Newt Minion, MD;  Location: [place];   Service: Orthopedics;  Laterality: Left;  Left Foot 1st Ray Amputation  . AMPUTATION Left 06/09/2013   Procedure: AMPUTATION BELOW KNEE;  Surgeon: Newt Minion, MD;  Location: [place];  Service: Orthopedics;  Laterality: Left;  Left Below Knee Amputation and removal proximal screws IM tibial nail  . AMPUTATION Right 09/08/2013   Procedure: AMPUTATION BELOW KNEE;  Surgeon: Newt Minion, MD;  Location: [place];  Service: Orthopedics;  Laterality: Right;  Right Below Knee Amputation  . AMPUTATION Right 10/11/2013   Procedure: AMPUTATION BELOW KNEE;  Surgeon: Newt Minion, MD;  Location: [place];  Service: Orthopedics;  Laterality: Right;  Right Below Knee Amputation Revision  . AV FISTULA PLACEMENT  06/14/2012   Procedure: ARTERIOVENOUS (AV) FISTULA CREATION;  Surgeon: Angelia Mould, MD;  Location: [facility] OR;  Service: Vascular;  Laterality: Left;  Left basilic vein transposition with fistula.  [name] BASCILIC VEIN TRANSPOSITION Left 02/17/2017   Procedure: LEFT 1ST STAGE BRACHIAL VEIN TRANSPOSITION;  Surgeon: Conrad Hobson, MD;  Location: [place];  Service: Vascular;  Laterality: Left;  . BASCILIC VEIN TRANSPOSITION Left 04/21/2017   Procedure: LEFT 2ND STAGE BRACHIAL VEIN TRANSPOSITION;  Surgeon: Conrad [place], MD;  Location: [place];  Service: Vascular;  Laterality: Left;  . COLONOSCOPY N/A 10/28/2012   Procedure: COLONOSCOPY;  Surgeon: Jeryl Columbia, MD;  Location[facility] ENDOSCOPY;  Service: Endoscopy;  Laterality: N/A;  . COLONOSCOPY N/A  11/02/2012   Procedure: COLONOSCOPY;  Surgeon: Cleotis Nipper, MD;  Location[facility];  Service: Endoscopy;  Laterality: N/A;  . COLONOSCOPY N/A 11/03/2012   Procedure: COLONOSCOPY;  Surgeon: Cleotis Nipper, MD;  Location: [facility] ENDOSCOPY;  Service: Endoscopy;  Laterality: N/A;  . COLONOSCOPY N/A 09/16/2016   Procedure: COLONOSCOPY;  Surgeon: Jerene Bears, MD;  Location: [facility] ENDOSCOPY;  Service: Gastroenterology;  Laterality: N/A;  . ENTEROSCOPY N/A 11/08/2012   Procedure:  ENTEROSCOPY;  Surgeon: Wonda Horner, MD;  Location: [facility] END[facility]COPY;  Service: Endoscopy;  Laterality: N/A;  . ESOPHAGOGASTRODUODENOSCOPY N/A 11/02/2012   Procedure: ESOPHAGOGASTRODUODENOSCOPY (EGD);  Surgeon: Cleotis Nipper, MD;  Location: [facility] ENDOSCOPY;  Service: Endoscopy;  Laterality: N/A;  . ESOPHAGOGASTRODUODENOSCOPY (EGD) WITH PROPOFOL N/A 09/16/2016   Procedure: ESOPHAGOGASTRODUODENOSCOPY (EGD) WITH PROPOFOL;  Surgeon: Jerene Bears, MD;  Location: [facility] ENDOSCOPY;  Service: Gastroenterology;  Laterality: N/A;  . EXCHANGE OF A DIALYSIS CATHETER Left 11/13/2016   Procedure: EXCHANGE OF A DIALYSIS CATHETER;  Surgeon: Elam Dutch, MD;  Location: [place];  Service: Vascular;  Laterality: Left;  . EYE SURGERY Left    to remove scar tissue  . GIVENS CAPSULE STUDY N/A 11/04/2012   Procedure: GIVENS CAPSULE STUDY;  Surgeon: Cleotis Nipper, MD;  Location: [facility] ENDOSCOPY;  Service: Endoscopy;  Laterality: N/A;  . GIVENS CAPSULE STUDY N/A 09/29/2016   Procedure: GIVENS CAPSULE STUDY;  Surgeon: Manus Gunning, MD;  Location: [place];  Service: Gastroenterology;  Laterality: N/A;  try to keep pt up in bedside chair as much as possible during the study.    [name] HARDWARE REMOVAL Left 06/09/2013   Procedure: HARDWARE REMOVAL;  Surgeon: Newt Minion, MD;  Location: [place];  Service: Orthopedics;  Laterality: Left;  Left Below Knee Amputation  and Removal proximal screws IM tibial nail  . HEMATOMA EVACUATION Left 04/29/2017   Procedure: EVACUATION HEMATOMA LEFT ARM;  Surgeon: Conrad [place]ack[place], MD;  Location: [place];  Service: Vascular;  Laterality: Left;  . INSERTION OF DIALYSIS CATHETER N/A 11/09/2016   Procedure: INSERTION OF TEMPORARY DIALYSIS CATHETER;  Surgeon: Elam Dutch, MD;  Location: [place];  Service: Vascular;  Laterality: N/A;  . IR GENERIC HISTORICAL  09/27/2016   IR US GUIDE VASC ACCESS RIGHT 09/27/2016 Aletta Edouard, MD [facility]-INTERV RAD  . IR GENERIC HISTORICAL  09/27/2016   IR FLUORO GUIDE CV LINE  RIGHT 09/27/2016 Aletta Edouard, MD [facility]  . LIGATION OF ARTERIOVENOUS  FISTULA Left 11/09/2016   Procedure: LIGATION OF ARTERIOVENOUS  FISTULA;  Surgeon: Elam Dutch, MD;  Location: [place];  Service: Vascular;  Laterality: Left;  . NEPHRECTOMY     partial RR  . ORIF FIBULA FRACTURE Left 09/09/2012   Procedure: OPEN REDUCTION INTERNAL FIXATION (ORIF) FIBULA FRACTURE;  Surgeon: Johnny Bridge, MD;  Location: [place];  Service: Orthopedics;  Laterality: Left;  . PACEMAKER IMPLANT N/A 04/30/2017   Procedure: PACEMAKER IMPLANT;  Surgeon: Evans Lance, MD;  Location: [place] CV LAB;  Service: Cardiovascular;  Laterality: N/A;  . TEMPORARY PACEMAKER N/A 04/29/2017   Procedure: TEMPORARY PACEMAKER;  Surgeon: Lorretta Harp, MD;  Location: [place] CV LAB;  Service: Cardiovascular;  Laterality: N/A;  . TIBIA IM NAIL INSERTION Left 09/09/2012   Procedure: INTRAMEDULLARY (IM) NAIL TIBIAL;  Surgeon: Johnny Bridge, MD;  Location: [place];  Service: Orthopedics;  Laterality: Left;  left tibial nail and open reduction internal fixation left fibula fracture  . UPPER EXTREMITY VENOGRAPHY  N/A 12/14/2016   Procedure: Bilateral Upper Extremity Venography and Central Venography;  Surgeon: Conrad Haworth, MD;  Location: [place] CV LAB;  Service: Cardiovascular;  Laterality: N/A;    Social History   Socioeconomic History  . Marital status: Married    Spouse name: Not on file  . Number of children: 2  . Years of education: 23  . Highest education level: Not on file  Social Needs  . Financial resource strain: Not on file  . Food insecurity - worry: Not on file  . Food insecurity - inability: Not on file  . Transportation needs - medical: Not on file  . Transportation needs - non-medical: Not on file  Occupational History  . Occupation: disabled due to stroke    Employer: RETIRED  Tobacco Use  . Smoking status: Never Smoker  . Smokeless tobacco: Never Used  Substance and Sexual  Activity  . Alcohol use: No  . Drug use: No  . Sexual activity: Yes    Birth control/protection: None  Other Topics Concern  . 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 Diabetes Mother   . Hypertension Mother   . Heart attack Father   . Hypertension Father   . Coronary artery disease Other     Current Outpatient Medications  Medication Sig Dispense Refill  . acetaminophen (TYLENOL) 500 MG tablet Take 500 mg by mouth every 6 (six) hours as needed for mild pain.    [name] amiodarone (PACERONE) 200 MG tablet TAKE 1 TABLET BY MOUTH DAILY, START THIS ON 11/06/16 (Patient taking differently: TAKE 200 mg TABLET BY MOUTH DAILY, START THIS ON 11/06/16) 90 tablet 3  . atorvastatin (LIPITOR) 40 MG tablet TAKE 1 TABLET(40 MG) BY MOUTH DAILY AT 6 PM 30 tablet 4  . calcium acetate (PHOSLO) 667 MG capsule Take 1,334 mg by mouth 3 (three) times daily with meals.     . gabapentin (NEURONTIN) 300 MG capsule Take 1 capsule (300 mg total) by mouth 3 (three) times daily. 3 times a day when necessary neuropathy pain 90 capsule 3  . latanoprost (XALATAN) 0.005 % ophthalmic solution Place 1 drop into both eyes at bedtime.     . pantoprazole (PROTONIX) 40 MG tablet Take 1 tablet (40 mg total) by mouth 2 (two) times daily before a meal. 60 tablet 11  . QUEtiapine (SEROQUEL) 25 MG tablet Take 0.5 tablets (12.5 mg total) by mouth at bedtime. 60 tablet 8  . senna (SENOKOT) 8.6 MG TABS tablet Take 1 tablet (8.6 mg total) by mouth daily. 120 each 0  . SENSIPAR 30 MG tablet Take 30 mg by mouth every evening.     . silver sulfADIAZINE (SILVADENE) 1 % cream Apply 1 application topically daily as needed (irratation). 50 g   . sodium phosphate (FLEET) 7-19 GM/118ML ENEM Place 133 mLs (1 enema total) rectally daily as needed for severe constipation. 30 enema 0  . sorbitol 70 % SOLN Take 30 mLs  by mouth 2 (two) times daily. 473 mL 0  . SSD 1 % cream APPLY EXTERNALLY TO THE AFFECTED AREA DAILY 50 g 0  . traMADol (ULTRAM) 50 MG tablet Take 50 mg by mouth every 6 (six) hours as needed for moderate pain.    [name] warfarin (COUMADIN) 2.5 MG tablet Take as directed by Anticoagulation Clinic  40 tablet 3   No current facility-administered medications for this visit.      Allergies  Allergen Reactions  . No Known Allergies     REVIEW OF SYSTEMS (negative unless checked):   Cardiac:  []  Chest pain or chest pressure? []  Shortness of breath upon activity? []  Shortness of breath when lying flat? []  Irregular heart rhythm?  Vascular:  []  Pain in calf, thigh, or hip brought on by walking? []  Pain in feet at night that wakes you up from your sleep? []  Blood clot in your veins? []  Leg swelling?  Pulmonary:  []  Oxygen at home? []  Productive cough? []  Wheezing?  Neurologic:  []  Sudden weakness in arms or legs? []  Sudden numbness in arms or legs? []  Sudden onset of difficult speaking or slurred speech? []  Temporary loss of vision in one eye? []  Problems with dizziness?  Gastrointestinal:  []  Blood in stool? []  Vomited blood?  Genitourinary:  []  Burning when urinating? []  Blood in urine? [x]  end stage renal disease-HD: T/R/S  Psychiatric:  []  Major depression  Hematologic:  []  Bleeding problems? []  Problems with blood clotting?  Dermatologic:  []  Rashes or ulcers?  Constitutional:  []  Fever or chills?  Ear/Nose/Throat:  []  Change in hearing? []  Nose bleeds? []  Sore throat?  Musculoskeletal:  []  Back pain? []  Joint pain? []  Muscle pain?   Physical Examination   Vitals:   07/02/17 1036  BP: 92/71  Pulse: 79  Resp: 20  Temp: (!) 97.3 F (36.3 C)  TempSrc: Oral  Weight: 205 lb (93 kg)  Height: 5\' 10"  (1.778 m)   Pulmonary Sym exp, good air movt, CTAB, no rales, rhonchi, & wheezing  Cardiac RRR, Nl S1, S2, no Murmurs, rubs or gallops   leftarm  Incision is healed, both hands are cold, hand grip is 4/5, sensation in digits is intact, no thrill or bruit     Medical Decision Making   EZZARD DITMER is a 66 y.o. (12/31/1950) male who presents s/p leftsecond stage brachial vein transpositioncomplicated by bleeding and thrombosis, s/p L arm hematoma evacuation, s/p R PPM   Unfortunately fistula has occluded, which is not unexpected given need to revise the fistula and smaller central segment in target vein.  Will try of LUA AVG next.  This may also be a change if the axillary vein had not dilated appropriately.  Risk, benefits, and alternatives to access surgery were discussed.    The patient is aware the risks include but are not limited to: bleeding, infection, steal syndrome, nerve damage, ischemic monomelic neuropathy, thrombosis, failure to mature, complications related to venous hypertension, need for additional procedures, death and stroke.    The patient agrees to proceed forward with the procedure.  Pt is scheduled for 12 DEC 18  Thank you for allowing [place] to participate in this patient's care.   Adele Barthel, MD, FACS [facility] Office: [phone] Pager: [phone]

## 2017-06-29 ENCOUNTER — Other Ambulatory Visit: Payer: Self-pay | Admitting: *Deleted

## 2017-06-29 DIAGNOSIS — D631 Anemia in chronic kidney disease: Secondary | ICD-10-CM | POA: Diagnosis not present

## 2017-06-29 DIAGNOSIS — N186 End stage renal disease: Secondary | ICD-10-CM | POA: Diagnosis not present

## 2017-06-29 DIAGNOSIS — D689 Coagulation defect, unspecified: Secondary | ICD-10-CM | POA: Diagnosis not present

## 2017-06-29 DIAGNOSIS — N2581 Secondary hyperparathyroidism of renal origin: Secondary | ICD-10-CM | POA: Diagnosis not present

## 2017-06-29 NOTE — Patient Outreach (Signed)
[place] [facility]) Care Management  06/29/2017  KATSUMI WISLER August 11, 1950 [id number]   Call received from member as this care manager sent an unsuccessful outreach letter in the mail to the home as well as requested for social worker at dialysis center Ivin Booty) to provide member with contact information. Care guide, Kreg Shropshire and Ivin Booty are requesting assistance with obtaining lifting devices as well as possible transportation to help wife with management of his care.  Member state that he does feel his wife is in need of some assistance, unsure if the answer is a hoyer lift as suggested.  He is made aware that the LCWS with [facility] will be contacting him regarding concerns.  This care manager advised him of potential nursing responsibilities.  State he know his wife will need help caring for him, unsure of any nursing needs at this time.  He agrees to home visit within the next 2 weeks, will perform nursing assessment.  If no nursing needs after assessment will close case to nursing, transferring services to LCSW.  Advised to contact this care manager with questions between now and scheduled home visit.    [name], [place], MSN [place] [phone]

## 2017-06-30 DIAGNOSIS — Z48812 Encounter for surgical aftercare following surgery on the circulatory system: Secondary | ICD-10-CM | POA: Diagnosis not present

## 2017-06-30 DIAGNOSIS — I442 Atrioventricular block, complete: Secondary | ICD-10-CM | POA: Diagnosis not present

## 2017-06-30 DIAGNOSIS — N186 End stage renal disease: Secondary | ICD-10-CM | POA: Diagnosis not present

## 2017-06-30 DIAGNOSIS — E1122 Type 2 diabetes mellitus with diabetic chronic kidney disease: Secondary | ICD-10-CM | POA: Diagnosis not present

## 2017-06-30 DIAGNOSIS — I5042 Chronic combined systolic (congestive) and diastolic (congestive) heart failure: Secondary | ICD-10-CM | POA: Diagnosis not present

## 2017-06-30 DIAGNOSIS — I132 Hypertensive heart and chronic kidney disease with heart failure and with stage 5 chronic kidney disease, or end stage renal disease: Secondary | ICD-10-CM | POA: Diagnosis not present

## 2017-07-01 DIAGNOSIS — N2581 Secondary hyperparathyroidism of renal origin: Secondary | ICD-10-CM | POA: Diagnosis not present

## 2017-07-01 DIAGNOSIS — D689 Coagulation defect, unspecified: Secondary | ICD-10-CM | POA: Diagnosis not present

## 2017-07-01 DIAGNOSIS — D631 Anemia in chronic kidney disease: Secondary | ICD-10-CM | POA: Diagnosis not present

## 2017-07-01 DIAGNOSIS — N186 End stage renal disease: Secondary | ICD-10-CM | POA: Diagnosis not present

## 2017-07-02 ENCOUNTER — Telehealth: Payer: Self-pay | Admitting: Physician Assistant

## 2017-07-02 ENCOUNTER — Encounter: Payer: Self-pay | Admitting: Vascular Surgery

## 2017-07-02 ENCOUNTER — Encounter: Payer: Self-pay | Admitting: *Deleted

## 2017-07-02 ENCOUNTER — Ambulatory Visit (INDEPENDENT_AMBULATORY_CARE_PROVIDER_SITE_OTHER): Payer: Self-pay | Admitting: Vascular Surgery

## 2017-07-02 VITALS — BP 92/71 | HR 79 | Temp 97.3°F | Resp 20 | Ht 70.0 in | Wt 205.0 lb

## 2017-07-02 DIAGNOSIS — N186 End stage renal disease: Secondary | ICD-10-CM

## 2017-07-02 NOTE — Telephone Encounter (Signed)
Paged by Jacqlyn Larsen from Dr. Patsy [place] office regarding Mr Sickinger. H/o ESRD, CHA2DS2-Vasc score 7 (HTN, DM, age 66, PAD, HF, CVA). Dr. Bridgett Larsson initially wanted to do vascular surgery on 12/12 and requested guidance regarding when to hold coumadin and lovenox bridge. I have discussed with clinical pharmacist in the hospital, patient is not a good candidate for lovenox bridge due to ESRD.   May have to tolerate higher probability of stroke and hold coumadin for as few days as possible. Will forward to pharm D pool for assessment. This nonurgent surgery will be pushed back.  Hilbert Corrigan PA Pager: [phone]

## 2017-07-03 DIAGNOSIS — N186 End stage renal disease: Secondary | ICD-10-CM | POA: Diagnosis not present

## 2017-07-03 DIAGNOSIS — D631 Anemia in chronic kidney disease: Secondary | ICD-10-CM | POA: Diagnosis not present

## 2017-07-03 DIAGNOSIS — N2581 Secondary hyperparathyroidism of renal origin: Secondary | ICD-10-CM | POA: Diagnosis not present

## 2017-07-03 DIAGNOSIS — D689 Coagulation defect, unspecified: Secondary | ICD-10-CM | POA: Diagnosis not present

## 2017-07-06 ENCOUNTER — Other Ambulatory Visit: Payer: Self-pay | Admitting: *Deleted

## 2017-07-06 DIAGNOSIS — N186 End stage renal disease: Secondary | ICD-10-CM | POA: Diagnosis not present

## 2017-07-06 DIAGNOSIS — D631 Anemia in chronic kidney disease: Secondary | ICD-10-CM | POA: Diagnosis not present

## 2017-07-06 DIAGNOSIS — N2581 Secondary hyperparathyroidism of renal origin: Secondary | ICD-10-CM | POA: Diagnosis not present

## 2017-07-06 DIAGNOSIS — D689 Coagulation defect, unspecified: Secondary | ICD-10-CM | POA: Diagnosis not present

## 2017-07-06 NOTE — Telephone Encounter (Signed)
Patient with diagnosis of Atrial fibrillation on warfarin for anticoagulation.    Procedure: vascular surgery Date of procedure: TBD  CHADS2-VASc score of  7 (CHF, HTN, AGE 66, DM2, stroke/tia x 2, CAD)  CrCl = HD Platelet count = 209 on 05/09/2017  Per office protocol, patient can hold warfarin for 5 days prior to procedure.  Patient will need bridging with Lovenox (enoxaparin) around procedure.  *Lovenox dose NOT have absolute contraindication on dialysis*  Recommendation is to monitor closely if/when enoxaparin is given and monitor xa level if used for prolong time.    "CrCl < 30 mL/min: 1 mg/kg, once daily, rounded to  nearest syringe"  In compliance with drug insert and coumadin clinic protocol.  Additional recommendation is to admit patient to hospital and bridge with unfractionated heparin if desired.  Due to patient HIGH-RISK  status, bridging with lovenox is recommended. Also noted, patient received bridging therapy in the past without further complications.  Oscar Castillo PharmD, BCPS, [facility] [id number] 07/06/2017 3:59 PM

## 2017-07-06 NOTE — Patient Outreach (Signed)
[facility]) Care Management  07/06/2017  TAY WHITWELL [birth year] [id number]   CSW made an attempt to try and contact patient today to follow-up regarding social work services and resources, as well as to ensure that patient received the packet of resources mailed to his home by CSW; however, patient was unavailable at the time of CSW's call.  A HIPAA compliant message was left for patient on voicemail and CSW is currently awaiting a return call.  CSW will make a second outreach attempt within the next week, if a return call is not received from patient in the meantime. Nat Christen, BSW, MSW, LCSW  Licensed Education officer, [facility]  Mailing [place] [address], [place], [place] [address] Physical Address-[address], [place], [place] [address] Toll Free Main # [phone] Fax # [phone] Cell # [phone]  Office # [phone] [email or web address]

## 2017-07-07 ENCOUNTER — Encounter (HOSPITAL_COMMUNITY): Payer: Self-pay | Admitting: Emergency Medicine

## 2017-07-07 ENCOUNTER — Other Ambulatory Visit: Payer: Self-pay | Admitting: *Deleted

## 2017-07-07 ENCOUNTER — Emergency Department (HOSPITAL_COMMUNITY)
Admission: EM | Admit: 2017-07-07 | Discharge: 2017-07-07 | Disposition: A | Discharge status: Home / Self Care | Payer: Medicare Other | Attending: Emergency Medicine | Admitting: Emergency Medicine

## 2017-07-07 ENCOUNTER — Emergency Department (HOSPITAL_COMMUNITY): Payer: Medicare Other

## 2017-07-07 DIAGNOSIS — R531 Weakness: Secondary | ICD-10-CM | POA: Diagnosis not present

## 2017-07-07 DIAGNOSIS — I132 Hypertensive heart and chronic kidney disease with heart failure and with stage 5 chronic kidney disease, or end stage renal disease: Secondary | ICD-10-CM | POA: Diagnosis not present

## 2017-07-07 DIAGNOSIS — R404 Transient alteration of awareness: Secondary | ICD-10-CM | POA: Diagnosis not present

## 2017-07-07 DIAGNOSIS — I5042 Chronic combined systolic (congestive) and diastolic (congestive) heart failure: Secondary | ICD-10-CM | POA: Insufficient documentation

## 2017-07-07 DIAGNOSIS — N186 End stage renal disease: Secondary | ICD-10-CM | POA: Insufficient documentation

## 2017-07-07 DIAGNOSIS — E1122 Type 2 diabetes mellitus with diabetic chronic kidney disease: Secondary | ICD-10-CM | POA: Diagnosis not present

## 2017-07-07 DIAGNOSIS — I442 Atrioventricular block, complete: Secondary | ICD-10-CM | POA: Diagnosis not present

## 2017-07-07 DIAGNOSIS — Z79899 Other long term (current) drug therapy: Secondary | ICD-10-CM | POA: Insufficient documentation

## 2017-07-07 DIAGNOSIS — Z992 Dependence on renal dialysis: Secondary | ICD-10-CM | POA: Diagnosis not present

## 2017-07-07 DIAGNOSIS — I959 Hypotension, unspecified: Secondary | ICD-10-CM | POA: Diagnosis not present

## 2017-07-07 DIAGNOSIS — Z48812 Encounter for surgical aftercare following surgery on the circulatory system: Secondary | ICD-10-CM | POA: Diagnosis not present

## 2017-07-07 DIAGNOSIS — R4182 Altered mental status, unspecified: Secondary | ICD-10-CM | POA: Diagnosis not present

## 2017-07-07 LAB — COMPREHENSIVE METABOLIC PANEL
ALT: 9 U/L — ABNORMAL LOW (ref 17–63)
AST: 19 U/L (ref 15–41)
Albumin: 3.8 g/dL (ref 3.5–5.0)
Alkaline Phosphatase: 89 U/L (ref 38–126)
Anion gap: 17 — ABNORMAL HIGH (ref 5–15)
BILIRUBIN TOTAL: 0.5 mg/dL (ref 0.3–1.2)
BUN: 57 mg/dL — AB (ref 6–20)
CO2: 21 mmol/L — ABNORMAL LOW (ref 22–32)
CREATININE: 11.59 mg/dL — AB (ref 0.61–1.24)
Calcium: 9.6 mg/dL (ref 8.9–10.3)
Chloride: 95 mmol/L — ABNORMAL LOW (ref 101–111)
GFR, EST AFRICAN AMERICAN: 5 mL/min — AB (ref >60)
GFR, EST NON AFRICAN AMERICAN: 4 mL/min — AB (ref >60)
Glucose, Bld: 110 mg/dL — ABNORMAL HIGH (ref 65–99)
POTASSIUM: 6.1 mmol/L — AB (ref 3.5–5.1)
Sodium: 133 mmol/L — ABNORMAL LOW (ref 135–145)
TOTAL PROTEIN: 9.9 g/dL — AB (ref 6.5–8.1)

## 2017-07-07 LAB — CBC WITH DIFFERENTIAL/PLATELET
BASOS ABS: 0 10*3/uL (ref 0.0–0.1)
Basophils Relative: 0 %
EOS ABS: 0.1 10*3/uL (ref 0.0–0.7)
Eosinophils Relative: 1 %
HEMATOCRIT: 43.1 % (ref 39.0–52.0)
Hemoglobin: 13.9 g/dL (ref 13.0–17.0)
LYMPHS ABS: 1.8 10*3/uL (ref 0.7–4.0)
Lymphocytes Relative: 31 %
MCH: 27.8 pg (ref 26.0–34.0)
MCHC: 32.3 g/dL (ref 30.0–36.0)
MCV: 86.2 fL (ref 78.0–100.0)
Monocytes Absolute: 0.6 10*3/uL (ref 0.1–1.0)
Monocytes Relative: 11 %
NEUTROS ABS: 3.4 10*3/uL (ref 1.7–7.7)
NEUTROS PCT: 57 %
PLATELETS: 154 10*3/uL (ref 150–400)
RBC: 5 MIL/uL (ref 4.22–5.81)
RDW: 18.8 % — ABNORMAL HIGH (ref 11.5–15.5)
WBC: 5.9 10*3/uL (ref 4.0–10.5)

## 2017-07-07 LAB — PROTIME-INR
INR: 1.93
Prothrombin Time: 21.9 seconds — ABNORMAL HIGH (ref 11.4–15.2)

## 2017-07-07 LAB — I-STAT TROPONIN, ED: TROPONIN I, POC: 0.01 ng/mL (ref 0.00–0.08)

## 2017-07-07 LAB — I-STAT CG4 LACTIC ACID, ED
LACTIC ACID, VENOUS: 2.6 mmol/L — AB (ref 0.5–1.9)
Lactic Acid, Venous: 2.25 mmol/L (ref 0.5–1.9)

## 2017-07-07 LAB — AMMONIA: Ammonia: 26 umol/L (ref 9–35)

## 2017-07-07 MED ORDER — SODIUM CHLORIDE 0.9 % IV BOLUS (SEPSIS)
500.0000 mL | Freq: Once | INTRAVENOUS | Status: AC
  Administered 2017-07-07: 500 mL via INTRAVENOUS

## 2017-07-07 NOTE — ED Notes (Signed)
Patient transported to X-ray 

## 2017-07-07 NOTE — ED Triage Notes (Signed)
Pt brought from home by [facility] for AMS that began  Hr PTA per family on scene; states he was normal/at baseline this morning; EMS reports they could not obtain a BP and only a carotid pulse at 80bpm; pt alert upon arrival and oriented to self only; hx of HD (unknown last HD session, catheter L upper chest)

## 2017-07-07 NOTE — Telephone Encounter (Signed)
   Primary Cardiologist: Dr. Harrington Challenger  Chart reviewed as part of pre-operative protocol coverage.Melina Schools would require Lovenox Bridging as outlined by Pharmacy below.   He has follow-up scheduled with the Coumadin clinic to further discuss on 12/17. I do not yet see a procedure date listed and this will need to be determined prior to his appointment so bridging can be arranged. Initially listed as 07/07/2017 but I do not see any current admissions in Epic.  I will route this recommendation to the requesting party via Epic fax function and remove from pre-op pool.  Please call with questions.  Erma Heritage, PA-C 07/07/2017, 3:34 PM

## 2017-07-07 NOTE — ED Provider Notes (Signed)
[facility] EMERGENCY DEPARTMENT Provider Note   CSN: [id number] Arrival date & time: 07/07/17  1734     History   Chief Complaint Chief Complaint  Patient presents with  . Altered Mental Status    HPI Oscar Castillo is a 66 y.o. male.  Patient is a 66 year old male with multiple medical problems including end-stage renal disease on dialysis Tuesday Thursday Saturday, diabetes, prior CVA, GI bleed, hepatitis C, CHF, chronic left bundle branch block presenting today with approximately 24 hours of increased fatigue, mild altered mental status and generalized weakness.  Patient's wife states that he recently started taking Midrin prior to dialysis due to recurrent episodes of hypotension.  He had a full course of dialysis on Tuesday but because they just started this medication the wife forgot to give it to him.  Since arriving home from dialysis yesterday he has had increased weakness.  Patient's wife states that he is normally able to help her transfer as he is wheelchair-bound due to bilateral BKA's.  Patient's wife states that he was very sleepy after getting home from dialysis yesterday.  Last night he had 2 episodes of vomiting after eating and then slept all night.  Today patient has eaten very little which wife states is extremely unusual.  He has been sleeping throughout the day.  When she tried to check his blood pressure this morning it would not read on their cuff.  She spoke with the doctor and did give him the [place].  When his physical therapist got there about 11 he seemed to be more himself and was able to complete physical therapy but then after that slept all day long which his wife states is very unusual.  She states he seemed to glaze over and just be staring off to the distance.  When she was trying to give him his bath earlier today he kept falling to the right.  He has had no further episodes of vomiting.  No fever, diarrhea or coughing.  He has not appeared  short of breath.  No other recent medication changes.  This afternoon when he seemed very sleepy and glaze she tried checking his blood pressure again and it would not read.  Prior to the physical therapist leaving today his last blood pressure was 89/60.   The history is provided by the patient and the spouse.    Past Medical History:  Diagnosis Date  . Anemia   . Antral ulcer 2014   small  . BENIGN PROSTATIC HYPERTROPHY   . Bipolar disorder ([place])    "sometimes" (10/07/2016)  . CHOLELITHIASIS   . Chronic combined systolic and diastolic CHF (congestive heart failure) ([place])   . Complication of anesthesia    wife states pt had trouble waking up with in Nov., 2014  . CVA (cerebral vascular accident) ([place]) 07/2007  . DEPRESSION   . DIABETES MELLITUS, TYPE II    diet control  . ERECTILE DYSFUNCTION   . ESRD on hemodialysis ([place])    ESRD due to DM/HTN. Started dialysis in November 2013.  HD TTS at [facility] on [address].  [name] GERD   . GI bleed    due to gastritis, discharged 10/02/16/notes 10/07/2016  . Hepatitis C    C - has been treated  . History of Clostridium difficile   . History of kidney stones   . HYPERTENSION   . LBBB (left bundle branch block)   . Morbid obesity ([place])   . PAF (  paroxysmal atrial fibrillation) ([place])    a. Dx 12/2015.    Patient Active Problem List   Diagnosis Date Noted  . Sacral ulcer ([place]) 05/07/2017  . CHB (complete heart block) ([place]) 04/29/2017  . Heart block AV third degree ([place]) 04/29/2017  . Complete heart block ([place])   . PAD (peripheral artery disease) ([place])   . General weakness 02/10/2017  . Gait disorder 02/10/2017  . MSSA bacteremia   . Sepsis ([place]) 11/08/2016  . Encounter for therapeutic drug monitoring 10/07/2016  . Diabetes mellitus with complication ([place])   . Anemia due to stage 5 chronic kidney disease ([place])   . Melena 09/26/2016  . Pressure injury of skin 09/26/2016  . Benign neoplasm of descending colon   . Gastroesophageal  reflux disease with esophagitis   . Duodenal ulcer without hemorrhage or perforation   . Blood loss anemia 09/15/2016  . Paroxysmal atrial fibrillation ([place]) 09/15/2016  . Heme positive stool 09/15/2016  . RUQ pain 07/12/2016  . Hyperkalemia 07/01/2016  . Anemia 12/31/2015  . Glaucoma 12/27/2015  . LBBB (left bundle branch block)   . Rash 11/15/2015  . Cramping of hands 11/15/2015  . Liver fibrosis 10/07/2015  . Diarrhea 08/14/2014  . Dehydration 10/02/2013  . Fever 10/02/2013  . Altered mental status 10/01/2013  . Encephalopathy, toxic 10/01/2013  . Encephalopathy, metabolic 70/62/3762  . History of stroke 09/28/2013  . FTT (failure to thrive) in adult 09/28/2013  . Acute confusional state 09/28/2013  . Ulcer of sacral region, stage 3 ([place]) 09/26/2013  . S/P BKA (below knee amputation) bilateral ([place]) 09/08/2013  . ESRD on hemodialysis ([place]) 05/09/2013  . TIA (transient ischemic attack) 02/20/2013  . Acute blood loss anemia 10/28/2012  . Acute on chronic combined systolic and diastolic CHF (congestive heart failure) ([place]) 10/28/2012  . Constipation 10/28/2012  . GI bleed 10/27/2012  . Mass in rectum 10/27/2012  . Type 2 diabetes mellitus with chronic kidney disease on chronic dialysis, with long-term current use of insulin ([place]) 09/08/2012  . ESRD (end stage renal disease) ([place]) 09/08/2012  . LVH (left ventricular hypertrophy)-severe concentric 06/13/2012  . Hyperlipidemia 01/30/2011  . CHOLELITHIASIS 08/01/2010  . BENIGN PROSTATIC HYPERTROPHY 08/01/2010  . CEREBROVASCULAR ACCIDENT, HX OF 08/06/2009  . Depression 03/18/2009  . NEPHROLITHIASIS, HX OF 03/18/2009  . Morbid obesity ([place]) 03/25/2007  . Hypertensive heart disease with CHF (congestive heart failure) ([place]) 03/25/2007  . GERD 03/25/2007  . Chronic hepatitis C without hepatic coma ([place]) 03/25/2007    Past Surgical History:  Procedure Laterality Date  . AMPUTATION Left 05/12/2013   Procedure: AMPUTATION RAY;   Surgeon: Newt Minion, MD;  Location: [facility];  Service: Orthopedics;  Laterality: Left;  Left Foot 1st Ray Amputation  . AMPUTATION Left 06/09/2013   Procedure: AMPUTATION BELOW KNEE;  Surgeon: Newt Minion, MD;  Location: [facility];  Service: Orthopedics;  Laterality: Left;  Left Below Knee Amputation and removal proximal screws IM tibial nail  . AMPUTATION Right 09/08/2013   Procedure: AMPUTATION BELOW KNEE;  Surgeon: Newt Minion, MD;  Location: [facility] [facility];  Service: Orthopedics;  Laterality: Right;  Right Below Knee Amputation  . AMPUTATION Right 10/11/2013   Procedure: AMPUTATION BELOW KNEE;  Surgeon: Newt Minion, MD;  Location: [place];  Service: Orthopedics;  Laterality: Right;  Right Below Knee Amputation Revision  . AV FISTULA PLACEMENT  06/14/2012   Procedure: ARTERIOVENOUS (AV) FISTULA CREATION;  Surgeon: Angelia Mould, MD;  Location: [facility] [facility];  Service: Vascular;  Laterality: Left;  Left basilic vein transposition with fistula.  [name] BASCILIC VEIN TRANSPOSITION Left 02/17/2017   Procedure: LEFT 1ST STAGE BRACHIAL VEIN TRANSPOSITION;  Surgeon: Conrad Relampago, MD;  Location: [place] [facility];  Service: Vascular;  Laterality: Left;  . BASCILIC VEIN TRANSPOSITION Left 04/21/2017   Procedure: LEFT 2ND STAGE BRACHIAL VEIN TRANSPOSITION;  Surgeon: Conrad Ismay, MD;  Location: [place];  Service: Vascular;  Laterality: Left;  . COLONOSCOPY N/A 10/28/2012   Procedure: COLONOSCOPY;  Surgeon: Jeryl Columbia, MD;  Location: [facility] ENDOSCOPY;  Service: Endoscopy;  Laterality: N/A;  . COLONOSCOPY N/A 11/02/2012   Procedure: COLONOSCOPY;  Surgeon: Cleotis Nipper, MD;  Location: [facility] ENDOSCOPY;  Service: Endoscopy;  Laterality: N/A;  . COLONOSCOPY N/A 11/03/2012   Procedure: COLONOSCOPY;  Surgeon: Cleotis Nipper, MD;  Location: [facility] ENDOSCOPY;  Service: Endoscopy;  Laterality: N/A;  . COLONOSCOPY N/A 09/16/2016   Procedure: COLONOSCOPY;  Surgeon: Jerene Bears, MD;  Location: [facility] ENDOSCOPY;  Service: Gastroenterology;  Laterality: N/A;   . ENTEROSCOPY N/A 11/08/2012   Procedure: ENTEROSCOPY;  Surgeon: Wonda Horner, MD;  Location[facility] ENDOSCOPY;  Service: Endoscopy;  Laterality: N/A;  . ESOPHAGOGASTRODUODENOSCOPY N/A 11/02/2012   Procedure: ESOPHAGOGASTRODUODENOSCOPY (EGD);  Surgeon: Cleotis Nipper, MD;  Location: [facility] ENDOSCOPY;  Service: Endoscopy;  Laterality: N/A;  . ESOPHAGOGASTRODUODENOSCOPY (EGD) WITH PROPOFOL N/A 09/16/2016   Procedure: ESOPHAGOGASTRODUODENOSCOPY (EGD) WITH PROPOFOL;  Surgeon: Jerene Bears, MD;  Location: [facility] ENDOSCOPY;  Service: Gastroenterology;  Laterality: N/A;  . EXCHANGE OF A DIALYSIS CATHETER Left 11/13/2016   Procedure: EXCHANGE OF A DIALYSIS CATHETER;  Surgeon: Elam Dutch, MD;  Location: [facility] [place];  Service: Vascular;  Laterality: Left;  . EYE SURGERY Left    to remove scar tissue  . GIVENS CAPSULE STUDY N/A 11/04/2012   Procedure: GIVENS CAPSULE STUDY;  Surgeon: Cleotis Nipper, MD;  Location: [facility] ENDOSCOPY;  Service: Endoscopy;  Laterality: N/A;  . GIVENS CAPSULE STUDY N/A 09/29/2016   Procedure: GIVENS CAPSULE STUDY;  Surgeon: Manus Gunning, MD;  Location: [place] [facility];  Service: Gastroenterology;  Laterality: N/A;  try to keep pt up in bedside chair as much as possible during the study.    [name] HARDWARE REMOVAL Left 06/09/2013   Procedure: HARDWARE REMOVAL;  Surgeon: Newt Minion, MD;  Location: [place] [facility];  Service: Orthopedics;  Laterality: Left;  Left Below Knee Amputation  and Removal proximal screws IM tibial nail  . HEMATOMA EVACUATION Left 04/29/2017   Procedure: EVACUATION HEMATOMA LEFT ARM;  Surgeon: Conrad Elgin, MD;  Location: [place];  Service: Vascular;  Laterality: Left;  . INSERTION OF DIALYSIS CATHETER N/A 11/09/2016   Procedure: INSERTION OF TEMPORARY DIALYSIS CATHETER;  Surgeon: Elam Dutch, MD;  Location: [facility];  Service: Vascular;  Laterality: N/A;  . IR GENERIC HISTORICAL  09/27/2016   IR US GUIDE VASC ACCESS RIGHT 09/27/2016 Aletta Edouard, MD [facility]-INTERV RAD  . IR GENERIC  HISTORICAL  09/27/2016   IR FLUORO GUIDE CV LINE RIGHT 09/27/2016 Aletta Edouard, MD [facility]  . LIGATION OF ARTERIOVENOUS  FISTULA Left 11/09/2016   Procedure: LIGATION OF ARTERIOVENOUS  FISTULA;  Surgeon: Elam Dutch, MD;  Location: [place];  Service: Vascular;  Laterality: Left;  . NEPHRECTOMY     partial RR  . ORIF FIBULA FRACTURE Left 09/09/2012   Procedure: OPEN REDUCTION INTERNAL FIXATION (ORIF) FIBULA FRACTURE;  Surgeon: Johnny Bridge, MD;  Location: [place];  Service: Orthopedics;  Laterality: Left;  . PACEMAKER IMPLANT  N/A 04/30/2017   Procedure: PACEMAKER IMPLANT;  Surgeon: Evans Lance, MD;  Location: [place] CV LAB;  Service: Cardiovascular;  Laterality: N/A;  . TEMPORARY PACEMAKER N/A 04/29/2017   Procedure: TEMPORARY PACEMAKER;  Surgeon: Lorretta Harp, MD;  Location: [place] CV LAB;  Service: Cardiovascular;  Laterality: N/A;  . TIBIA IM NAIL INSERTION Left 09/09/2012   Procedure: INTRAMEDULLARY (IM) NAIL TIBIAL;  Surgeon: Johnny Bridge, MD;  Location: [place];  Service: Orthopedics;  Laterality: Left;  left tibial nail and open reduction internal fixation left fibula fracture  . UPPER EXTREMITY VENOGRAPHY N/A 12/14/2016   Procedure: Bilateral Upper Extremity Venography and Central Venography;  Surgeon: Conrad [place], MD;  Location: [place] CV LAB;  Service: Cardiovascular;  Laterality: N/A;       Home Medications    Prior to Admission medications   Medication Sig Start Date End Date Taking? Authorizing Provider  acetaminophen (TYLENOL) 500 MG tablet Take 500 mg by mouth every 6 (six) hours as needed for mild pain.    [provider]  amiodarone (PACERONE) 200 MG tablet TAKE 1 TABLET BY MOUTH DAILY, START THIS ON 11/06/16 Patient taking differently: TAKE 200 mg TABLET BY MOUTH DAILY, START THIS ON 11/06/16 11/02/16   Croitoru, Mihai, MD  atorvastatin (LIPITOR) 40 MG tablet TAKE 1 TABLET(40 MG) BY MOUTH DAILY AT 6 PM 12/02/16   [name], MD  calcium  acetate (PHOSLO) 667 MG capsule Take 1,334 mg by mouth 3 (three) times daily with meals.     [provider]  gabapentin (NEURONTIN) 300 MG capsule Take 1 capsule (300 mg total) by mouth 3 (three) times daily. 3 times a day when necessary neuropathy pain 06/28/17   Newt Minion, MD  latanoprost (XALATAN) 0.005 % ophthalmic solution Place 1 drop into both eyes at bedtime.  06/12/16   [provider]  pantoprazole (PROTONIX) 40 MG tablet Take 1 tablet (40 mg total) by mouth 2 (two) times daily before a meal. 10/14/16   [name], MD  QUEtiapine (SEROQUEL) 25 MG tablet Take 0.5 tablets (12.5 mg total) by mouth at bedtime. 05/09/17   Nita Sells, MD  senna (SENOKOT) 8.6 MG TABS tablet Take 1 tablet (8.6 mg total) by mouth daily. 05/09/17   Nita Sells, MD  SENSIPAR 30 MG tablet Take 30 mg by mouth every evening.  01/09/15   [provider]  silver sulfADIAZINE (SILVADENE) 1 % cream Apply 1 application topically daily as needed (irratation). 03/01/17   Suzan Slick, NP  sodium phosphate (FLEET) 7-19 GM/118ML ENEM Place 133 mLs (1 enema total) rectally daily as needed for severe constipation. 05/09/17   Nita Sells, MD  sorbitol 70 % SOLN Take 30 mLs by mouth 2 (two) times daily. 05/09/17   Nita Sells, MD  SSD 1 % cream APPLY EXTERNALLY TO THE AFFECTED AREA DAILY 04/27/17   Newt Minion, MD  traMADol (ULTRAM) 50 MG tablet Take 50 mg by mouth every 6 (six) hours as needed for moderate pain.    [provider]  warfarin (COUMADIN) 2.5 MG tablet Take as directed by Anticoagulation Clinic 05/27/17   Fay Records, MD    Family History Family History  Problem Relation Age of Onset  . Diabetes Mother   . Hypertension Mother   . Heart attack Father   . Hypertension Father   . Coronary artery disease Other     Social History Social History   Tobacco Use  .  Smoking status: Never Smoker  . Smokeless tobacco: Never Used    Substance Use Topics  . Alcohol use: No  . Drug use: No     Allergies   No known allergies   Review of Systems Review of Systems  All other systems reviewed and are negative.    Physical Exam Updated Vital Signs BP 115/86   Pulse 76   Temp 97.8 F (36.6 C) (Temporal)   Resp (!) 25   Physical Exam  Constitutional: He appears well-developed and well-nourished. No distress.  Patient is very sleepy on exam but when his name is called and he is spoken to he will wake up and follow commands appropriately  HENT:  Head: Normocephalic and atraumatic.  Mouth/Throat: Oropharynx is clear and moist.  Eyes: Conjunctivae and EOM are normal. Pupils are equal, round, and reactive to light.  63mm and sluggishly reactive  Neck: Normal range of motion. Neck supple.  Cardiovascular: Normal rate, regular rhythm and intact distal pulses.  No murmur heard. Pulmonary/Chest: Effort normal and breath sounds normal. No respiratory distress. He has no wheezes. He has no rales.  Dialysis catheter in the left upper chest.  Pacemaker in the right upper chest  Abdominal: Soft. He exhibits no distension. There is no tenderness. There is no rebound and no guarding.  Musculoskeletal: Normal range of motion. He exhibits no edema or tenderness.  Bilateral BKA  Neurological:  Seems to have more generalized weakness.  4 out of 5 strength in bilateral upper extremities.  Patient is slightly slouched over to the right but does not have obvious facial droop.  He is oriented to person and place but not time.  He can recognize family members.  Sleepy but arousable  Skin: Skin is warm and dry. No rash noted. No erythema.  Psychiatric: He has a normal mood and affect. His behavior is normal.  Nursing note and vitals reviewed.    ED Treatments / Results  Labs (all labs ordered are listed, but only abnormal results are displayed) Labs Reviewed  COMPREHENSIVE METABOLIC PANEL - Abnormal; Notable for the following  components:      Result Value   Sodium 133 (*)    Potassium 6.1 (*)    Chloride 95 (*)    CO2 21 (*)    Glucose, Bld 110 (*)    BUN 57 (*)    Creatinine, Ser 11.59 (*)    Total Protein 9.9 (*)    ALT 9 (*)    GFR calc non Af Amer 4 (*)    GFR calc Af Amer 5 (*)    Anion gap 17 (*)    All other components within normal limits  CBC WITH DIFFERENTIAL/PLATELET - Abnormal; Notable for the following components:   RDW 18.8 (*)    All other components within normal limits  PROTIME-INR - Abnormal; Notable for the following components:   Prothrombin Time 21.9 (*)    All other components within normal limits  I-STAT CG4 LACTIC ACID, ED - Abnormal; Notable for the following components:   Lactic Acid, Venous 2.60 (*)    All other components within normal limits  I-STAT CG4 LACTIC ACID, ED - Abnormal; Notable for the following components:   Lactic Acid, Venous 2.25 (*)    All other components within normal limits  CULTURE, BLOOD (ROUTINE X 2)  CULTURE, BLOOD (ROUTINE X 2)  AMMONIA  I-STAT TROPONIN, ED    EKG  EKG Interpretation  Date/Time:  Wednesday July 07 2017 17:34:49 EST  Ventricular Rate:  76 PR Interval:    QRS Duration: 173 QT Interval:  470 QTC Calculation: 529 R Axis:   -44 Text Interpretation:  Sinus rhythm Prolonged PR interval Left bundle branch block No significant change since last tracing Confirmed by Blanchie Dessert [phone]) on 07/07/2017 5:59:31 PM       Radiology Dg Chest 2 View  Result Date: 07/07/2017 CLINICAL DATA:  Altered mental status, onset today. EXAM: CHEST  2 VIEW COMPARISON:  05/06/2017 FINDINGS: Dual-lumen left jugular central line extends into the right atrium. Grossly intact transvenous cardiac leads. The lungs are clear. Normal pulmonary vasculature. No pleural effusions. Hilar, mediastinal and cardiac contours are unremarkable and unchanged. IMPRESSION: No active cardiopulmonary disease. Electronically Signed   By: Andreas Newport M.D.    On: 07/07/2017 18:55   Ct Head Wo Contrast  Result Date: 07/07/2017 CLINICAL DATA:  Altered mental status EXAM: CT HEAD WITHOUT CONTRAST TECHNIQUE: Contiguous axial images were obtained from the base of the skull through the vertex without intravenous contrast. COMPARISON:  Brain MRI 12/27/2015 FINDINGS: Brain: No mass lesion, intraparenchymal hemorrhage or extra-axial collection. No evidence of acute cortical infarct. There is periventricular hypoattenuation compatible with chronic microvascular disease. Vascular: No hyperdense vessel or unexpected calcification. Skull: Normal visualized skull base, calvarium and extracranial soft tissues. Sinuses/Orbits: No sinus fluid levels or advanced mucosal thickening. No mastoid effusion. Normal orbits. IMPRESSION: Chronic ischemic microangiopathy without acute intracranial abnormality. Electronically Signed   By: Ulyses Jarred M.D.   On: 07/07/2017 19:26    Procedures Procedures (including critical care time)  Medications Ordered in ED Medications - No data to display   Initial Impression / Assessment and Plan / ED Course  I have reviewed the triage vital signs and the nursing notes.  Pertinent labs & imaging results that were available during my care of the patient were reviewed by me and considered in my medical decision making (see chart for details).     Dialysis patient with multiple medical problems presenting today with mild altered mental status, generalized weakness and sleepiness.  Patient was having low blood pressure readings at home but blood pressure here has been between 100-115.  He is afebrile but slightly tachypneic.  He has no obvious evidence of infection.  Breath sounds are clear.  Concern for possible stroke versus MI versus electrolyte abnormality versus hepatic encephalopathy versus sepsis.  Head CT, chest x-ray, CBC, CMP, troponin, lactate, ammonia pending.  8:30 PM Labs are consistent with a mild lactic acidosis of 2.25.   Normal ammonia, CMP with a hyperkalemia of 6.1 and uremia.  CBC with stable creatinine and troponin within normal limits.  EKG is unchanged from prior.  Patient is on Coumadin but INR is subtherapeutic at 1.9.  His chest x-ray was within normal limits and his head CT did not show any acute findings.  Pt did have 2 readings of high 80's BP and given 563mL.  Spoke with nephrology about patient's labs his 6.1 potassium and uremia and they said at this point he would be fine to dialyzed tomorrow and did not need any medication for hyperkalemia.  On repeat evaluation wife states that patient is back to his normal self.  He continues to ask for something to eat and was given food.  He was given a 500 mL bolus.  Patient denies feeling woozy or drowsy.  Did encourage wife to remember to give him his Midrin 1 hour before dialysis.  Also discussed with her following closely with his doctor  if his blood pressure continues to be low at home if he may need to take a higher dose of Midrin or take it more regularly.  She states he has had issues for some time with having low blood pressure.  Final Clinical Impressions(s) / ED Diagnoses   Final diagnoses:  Hypotension, unspecified hypotension type  Weakness    ED Discharge Orders    None       Blanchie Dessert, MD 07/07/17 2055

## 2017-07-07 NOTE — Patient Outreach (Signed)
[facility]) Care Management  07/07/2017  Oscar Castillo 05/03/1951 [id number]   CSW was able to make contact with patient today to follow-up regarding social work services and resources, as well as ensure that patient received the packet of resource information mailed to his home by CSW.  Patient admitted that he had and that his wife, Oscar Castillo has actually been reviewing the information.  [name] denied having any questions about the information received, nor did he think that he and [name] would need assistance with completion of applications.  No additional social work needs have been identified at this time. CSW will perform a case closure on patient, as all goals of treatment have been met from social work standpoint.  CSW will notify patient's RNCM with [place] [facility], [name] of CSW's plans to close patient's case.  CSW will fax an update to patient's Primary Care Physician, Dr. [name] to ensure that they are aware of CSW's involvement with patient's plan of care.  CSW will submit a case closure request to Alycia Rossetti, Care Management Assistant with [place] [facility], in the form of an [employer].  CSW will ensure that [name] is aware of Lane's, RNCM with [facility], continued involvement with patient's care. [facility] CM Care Plan Problem One     Most Recent Value  Care Plan Problem One  Lack of in-home care services.  Role Documenting the Problem One  Clinical Social Worker  Care Plan for Problem One  Active  [facility] CM Short Term Goal #1   Patient will review applications for PCS and CAPS, within the next three weeks.  [facility] CM Short Term Goal #1 Start Date  06/22/17  [facility] CM Short Term Goal #1 Met Date  07/07/17  Interventions for Short Term Goal #1  CSW will mail patient applications for PCS and CAPS and assist with completion, if necessary.     Nat Christen, BSW,  MSW, LCSW  Licensed Education officer, [facility]  Mailing [place] [address], [place], [place] [address] Physical Address-[address], [place], [place] [address] Toll Free Main # [phone] Fax # [phone] Cell # [phone]  Office # [phone] [email or web address]

## 2017-07-07 NOTE — Discharge Instructions (Signed)
Return if he has any trouble breathing, chest pain, not responding or ongoing low blood pressure

## 2017-07-07 NOTE — ED Notes (Signed)
Lactic acid result given to Dr. Maryan Rued

## 2017-07-07 NOTE — ED Notes (Signed)
Pt given [place] sandwich, apple sauce, diet ginger ale, and beef broth

## 2017-07-08 DIAGNOSIS — N2581 Secondary hyperparathyroidism of renal origin: Secondary | ICD-10-CM | POA: Diagnosis not present

## 2017-07-08 DIAGNOSIS — D689 Coagulation defect, unspecified: Secondary | ICD-10-CM | POA: Diagnosis not present

## 2017-07-08 DIAGNOSIS — D631 Anemia in chronic kidney disease: Secondary | ICD-10-CM | POA: Diagnosis not present

## 2017-07-08 DIAGNOSIS — N186 End stage renal disease: Secondary | ICD-10-CM | POA: Diagnosis not present

## 2017-07-09 ENCOUNTER — Telehealth: Payer: Self-pay | Admitting: Internal Medicine

## 2017-07-09 NOTE — Telephone Encounter (Signed)
Pt's wife called stating that the pt has been having low BP after Dialysis even after taking Midrin before dialysis appts on T, Th, and Sat. Pt was seen and treated in the ED on Wed, 12/12 for low BP and altered mental status. Pt's wife is not with him at this time, pt she states he is home and was alert and oriented but she did not check his BP this morning. Pt's wife states that BP last night was 80/64, but did increase to 120/90. 6 month follow up is not scheduled until 08/18/17 and pt's wife wanting pt to be seen sooner.Pt scheduled for Monday 12/17.

## 2017-07-09 NOTE — Telephone Encounter (Signed)
LVM for patient to call back and to see if he wants to change his apppt on Monday with Mickel Baas to tomorrow Saturday with Dr. [name]

## 2017-07-10 ENCOUNTER — Ambulatory Visit (INDEPENDENT_AMBULATORY_CARE_PROVIDER_SITE_OTHER): Payer: Medicare Other | Admitting: Internal Medicine

## 2017-07-10 ENCOUNTER — Other Ambulatory Visit: Payer: Self-pay | Admitting: Internal Medicine

## 2017-07-10 ENCOUNTER — Encounter: Payer: Self-pay | Admitting: Internal Medicine

## 2017-07-10 VITALS — BP 94/62 | Temp 98.2°F | Ht 70.0 in | Wt 205.0 lb

## 2017-07-10 DIAGNOSIS — D689 Coagulation defect, unspecified: Secondary | ICD-10-CM | POA: Diagnosis not present

## 2017-07-10 DIAGNOSIS — R1310 Dysphagia, unspecified: Secondary | ICD-10-CM | POA: Diagnosis not present

## 2017-07-10 DIAGNOSIS — I6389 Other cerebral infarction: Secondary | ICD-10-CM | POA: Diagnosis not present

## 2017-07-10 DIAGNOSIS — E118 Type 2 diabetes mellitus with unspecified complications: Secondary | ICD-10-CM

## 2017-07-10 DIAGNOSIS — D631 Anemia in chronic kidney disease: Secondary | ICD-10-CM | POA: Diagnosis not present

## 2017-07-10 DIAGNOSIS — I9589 Other hypotension: Secondary | ICD-10-CM

## 2017-07-10 DIAGNOSIS — Z992 Dependence on renal dialysis: Secondary | ICD-10-CM

## 2017-07-10 DIAGNOSIS — N2581 Secondary hyperparathyroidism of renal origin: Secondary | ICD-10-CM | POA: Diagnosis not present

## 2017-07-10 DIAGNOSIS — Z8679 Personal history of other diseases of the circulatory system: Secondary | ICD-10-CM

## 2017-07-10 DIAGNOSIS — N186 End stage renal disease: Secondary | ICD-10-CM

## 2017-07-10 MED ORDER — MIDODRINE HCL 5 MG PO TABS: 5.0000 mg | ORAL_TABLET | Freq: Two times a day (BID) | ORAL | 1 refills | Status: DC

## 2017-07-10 NOTE — Progress Notes (Signed)
Subjective:    Patient ID: Oscar Castillo, male    DOB: [birth year], 66 y.o.   MRN: [id number]  HPI  Patient is a 66 year old male with multiple medical problems including end-stage renal disease on dialysis Tuesday Thursday Saturday, diabetes, prior CVA, GI bleed, hepatitis C, CHF, chronic left bundle branch block presenting today with 2 wks recurring difficulty with symptomatic hypotension.  He tends to feel weak, tired, dizzy and even somewhat confused per wife at times, usually assoc with taking the BP with SBP < 90, and better at least on HD days taking the midodrine 10 mg po just prior to HD only.  Pt is on fluid restriction to which he has been diligent, but wife is not sure of his exact dry wt.  Wife states he still has mild symptoms recurring on non HD days as well, and her most difficult situation is getting him to be strong enough to help with transfers from wheelchair to commode, not to mention getting him a car.  She just does not know how much longer she can manage, especially since she is smaller than he.  Wife asks for [facility] aide, but cannot afford a private care help in the home.  Is not involved with [facility] locally as yet.  He also has occasional difficulty with choking and coughing on swallowing in the last several weeks more than a few times but denies other new neuro symptoms.  Not recently seen per ST.  Has seen GI s/p EGD/colonoscopy/capsule study with gastritis and duodenitis in mar 2018 - no malignancy found.  Pt denies new neurological symptoms such as new headache, or facial or extremity weakness or numbness.  Pt denies polydipsia, polyuria  Pt denies fever.  Not taking anti- BP vasoactive meds Past Medical History:  Diagnosis Date  . Anemia   . Antral ulcer 2014   small  . BENIGN PROSTATIC HYPERTROPHY   . Bipolar disorder ([place])    "sometimes" (10/07/2016)  . CHOLELITHIASIS   . Chronic combined systolic and diastolic CHF (congestive heart failure) ([place])   . Complication of  anesthesia    wife states pt had trouble waking up with in Nov., 2014  . CVA (cerebral vascular accident) ([place]) 07/2007  . DEPRESSION   . DIABETES MELLITUS, TYPE II    diet control  . ERECTILE DYSFUNCTION   . ESRD on hemodialysis ([place])    ESRD due to DM/HTN. Started dialysis in November 2013.  HD TTS at [facility] on [address].  [name] GERD   . GI bleed    due to gastritis, discharged 10/02/16/notes 10/07/2016  . Hepatitis C    C - has been treated  . History of Clostridium difficile   . History of kidney stones   . HYPERTENSION   . LBBB (left bundle branch block)   . Morbid obesity ([place])   . PAF (paroxysmal atrial fibrillation) ([place])    a. Dx 12/2015.   Past Surgical History:  Procedure Laterality Date  . AMPUTATION Left 05/12/2013   Procedure: AMPUTATION RAY;  Surgeon: Newt Minion, MD;  Location: [place];  Service: Orthopedics;  Laterality: Left;  Left Foot 1st Ray Amputation  . AMPUTATION Left 06/09/2013   Procedure: AMPUTATION BELOW KNEE;  Surgeon: Newt Minion, MD;  Location: [place];  Service: Orthopedics;  Laterality: Left;  Left Below Knee Amputation and removal proximal screws IM tibial nail  . AMPUTATION Right 09/08/2013   Procedure: AMPUTATION BELOW KNEE;  Surgeon: Newt Minion,  MD;  Location: [place];  Service: Orthopedics;  Laterality: Right;  Right Below Knee Amputation  . AMPUTATION Right 10/11/2013   Procedure: AMPUTATION BELOW KNEE;  Surgeon: Newt Minion, MD;  Location: [place];  Service: Orthopedics;  Laterality: Right;  Right Below Knee Amputation Revision  . AV FISTULA PLACEMENT  06/14/2012   Procedure: ARTERIOVENOUS (AV) FISTULA CREATION;  Surgeon: Angelia Mould, MD;  Location: [facility] OR;  Service: Vascular;  Laterality: Left;  Left basilic vein transposition with fistula.  [name] BASCILIC VEIN TRANSPOSITION Left 02/17/2017   Procedure: LEFT 1ST STAGE BRACHIAL VEIN TRANSPOSITION;  Surgeon: Conrad [place] Run, MD;  Location: [place];  Service: Vascular;  Laterality: Left;  .  BASCILIC VEIN TRANSPOSITION Left 04/21/2017   Procedure: LEFT 2ND STAGE BRACHIAL VEIN TRANSPOSITION;  Surgeon: Conrad Jagual, MD;  Location: [place];  Service: Vascular;  Laterality: Left;  . COLONOSCOPY N/A 10/28/2012   Procedure: COLONOSCOPY;  Surgeon: Jeryl Columbia, MD;  Location: [facility] ENDOSCOPY;  Service: Endoscopy;  Laterality: N/A;  . COLONOSCOPY N/A 11/02/2012   Procedure: COLONOSCOPY;  Surgeon: Cleotis Nipper, MD;  Location: [facility] ENDOSCOPY;  Service: Endoscopy;  Laterality: N/A;  . COLONOSCOPY N/A 11/03/2012   Procedure: COLONOSCOPY;  Surgeon: Cleotis Nipper, MD;  Location: [facility] ENDOSCOPY;  Service: Endoscopy;  Laterality: N/A;  . COLONOSCOPY N/A 09/16/2016   Procedure: COLONOSCOPY;  Surgeon: Jerene Bears, MD;  Location: [facility] ENDOSCOPY;  Service: Gastroenterology;  Laterality: N/A;  . ENTEROSCOPY N/A 11/08/2012   Procedure: ENTEROSCOPY;  Surgeon: Wonda Horner, MD;  Location: [facility] ENDOSCOPY;  Service: Endoscopy;  Laterality: N/A;  . ESOPHAGOGASTRODUODENOSCOPY N/A 11/02/2012   Procedure: ESOPHAGOGASTRODUODENOSCOPY (EGD);  Surgeon: Cleotis Nipper, MD;  Location: [facility] ENDOSCOPY;  Service: Endoscopy;  Laterality: N/A;  . ESOPHAGOGASTRODUODENOSCOPY (EGD) WITH PROPOFOL N/A 09/16/2016   Procedure: ESOPHAGOGASTRODUODENOSCOPY (EGD) WITH PROPOFOL;  Surgeon: Jerene Bears, MD;  Location: [facility] ENDOSCOPY;  Service: Gastroenterology;  Laterality: N/A;  . EXCHANGE OF A DIALYSIS CATHETER Left 11/13/2016   Procedure: EXCHANGE OF A DIALYSIS CATHETER;  Surgeon: Elam Dutch, MD;  Location: [place];  Service: Vascular;  Laterality: Left;  . EYE SURGERY Left    to remove scar tissue  . GIVENS CAPSULE STUDY N/A 11/04/2012   Procedure: GIVENS CAPSULE STUDY;  Surgeon: Cleotis Nipper, MD;  Location: [facility] ENDOSCOPY;  Service: Endoscopy;  Laterality: N/A;  . GIVENS CAPSULE STUDY N/A 09/29/2016   Procedure: GIVENS CAPSULE STUDY;  Surgeon: Manus Gunning, MD;  Location: [place];  Service: Gastroenterology;  Laterality: N/A;   try to keep pt up in bedside chair as much as possible during the study.    [name] HARDWARE REMOVAL Left 06/09/2013   Procedure: HARDWARE REMOVAL;  Surgeon: Newt Minion, MD;  Location: [place];  Service: Orthopedics;  Laterality: Left;  Left Below Knee Amputation  and Removal proximal screws IM tibial nail  . HEMATOMA EVACUATION Left 04/29/2017   Procedure: EVACUATION HEMATOMA LEFT ARM;  Surgeon: Conrad Apal[place]ola, MD;  Location: [place];  Service: Vascular;  Laterality: Left;  . INSERTION OF DIALYSIS CATHETER N/A 11/09/2016   Procedure: INSERTION OF TEMPORARY DIALYSIS CATHETER;  Surgeon: Elam Dutch, MD;  Location: [place];  Service: Vascular;  Laterality: N/A;  . IR GENERIC HISTORICAL  09/27/2016   IR US GUIDE VASC ACCESS RIGHT 09/27/2016 Aletta Edouard, MD [facility]  . IR GENERIC HISTORICAL  09/27/2016   IR FLUORO GUIDE CV LINE RIGHT 09/27/2016 Aletta Edouard, MD [facility]  .  LIGATION OF ARTERIOVENOUS  FISTULA Left 11/09/2016   Procedure: LIGATION OF ARTERIOVENOUS  FISTULA;  Surgeon: Elam Dutch, MD;  Location: [place];  Service: Vascular;  Laterality: Left;  . NEPHRECTOMY     partial RR  . ORIF FIBULA FRACTURE Left 09/09/2012   Procedure: OPEN REDUCTION INTERNAL FIXATION (ORIF) FIBULA FRACTURE;  Surgeon: Johnny Bridge, MD;  Location: [place];  Service: Orthopedics;  Laterality: Left;  . PACEMAKER IMPLANT N/A 04/30/2017   Procedure: PACEMAKER IMPLANT;  Surgeon: Evans Lance, MD;  Location: [place] CV LAB;  Service: Cardiovascular;  Laterality: N/A;  . TEMPORARY PACEMAKER N/A 04/29/2017   Procedure: TEMPORARY PACEMAKER;  Surgeon: Lorretta Harp, MD;  Location: [place] CV LAB;  Service: Cardiovascular;  Laterality: N/A;  . TIBIA IM NAIL INSERTION Left 09/09/2012   Procedure: INTRAMEDULLARY (IM) NAIL TIBIAL;  Surgeon: Johnny Bridge, MD;  Location: [place];  Service: Orthopedics;  Laterality: Left;  left tibial nail and open reduction internal fixation left fibula fracture  . UPPER EXTREMITY  VENOGRAPHY N/A 12/14/2016   Procedure: Bilateral Upper Extremity Venography and Central Venography;  Surgeon: Conrad Perryville, MD;  Location: [place] CV LAB;  Service: Cardiovascular;  Laterality: N/A;    reports that  has never smoked. he has never used smokeless tobacco. He reports that he does not drink alcohol or use drugs. family history includes Coronary artery disease in his other; Diabetes in his mother; Heart attack in his father; Hypertension in his father and mother. Allergies  Allergen Reactions  . No Known Allergies    Current Outpatient Medications on File Prior to Visit  Medication Sig Dispense Refill  . acetaminophen (TYLENOL) 500 MG tablet Take 500 mg by mouth every 6 (six) hours as needed for mild pain.    [name] amiodarone (PACERONE) 200 MG tablet TAKE 1 TABLET BY MOUTH DAILY, START THIS ON 11/06/16 (Patient taking differently: TAKE 200 mg TABLET BY MOUTH DAILY, START THIS ON 11/06/16) 90 tablet 3  . atorvastatin (LIPITOR) 40 MG tablet TAKE 1 TABLET(40 MG) BY MOUTH DAILY AT 6 PM 30 tablet 4  . calcium acetate (PHOSLO) 667 MG capsule Take 1,334 mg by mouth 3 (three) times daily with meals.     . gabapentin (NEURONTIN) 300 MG capsule Take 1 capsule (300 mg total) by mouth 3 (three) times daily. 3 times a day when necessary neuropathy pain 90 capsule 3  . latanoprost (XALATAN) 0.005 % ophthalmic solution Place 1 drop into both eyes at bedtime.     . pantoprazole (PROTONIX) 40 MG tablet Take 1 tablet (40 mg total) by mouth 2 (two) times daily before a meal. 60 tablet 11  . QUEtiapine (SEROQUEL) 25 MG tablet Take 0.5 tablets (12.5 mg total) by mouth at bedtime. 60 tablet 8  . senna (SENOKOT) 8.6 MG TABS tablet Take 1 tablet (8.6 mg total) by mouth daily. 120 each 0  . SENSIPAR 30 MG tablet Take 30 mg by mouth every evening.     . silver sulfADIAZINE (SILVADENE) 1 % cream Apply 1 application topically daily as needed (irratation). 50 g   . sodium phosphate (FLEET) 7-19 GM/118ML ENEM  Place 133 mLs (1 enema total) rectally daily as needed for severe constipation. 30 enema 0  . sorbitol 70 % SOLN Take 30 mLs by mouth 2 (two) times daily. 473 mL 0  . SSD 1 % cream APPLY EXTERNALLY TO THE AFFECTED AREA DAILY 50 g 0  . traMADol (ULTRAM) 50 MG tablet Take  50 mg by mouth every 6 (six) hours as needed for moderate pain.    [name] warfarin (COUMADIN) 2.5 MG tablet Take as directed by Anticoagulation Clinic 40 tablet 3   No current facility-administered medications on file prior to visit.    ,Review of Systems  Constitutional: Negative for other unusual diaphoresis or sweats HENT: Negative for ear discharge or swelling Eyes: Negative for other worsening visual disturbances Respiratory: Negative for stridor or other swelling  Gastrointestinal: Negative for worsening distension or other blood Genitourinary: Negative for retention or other urinary change Musculoskeletal: Negative for other MSK pain or swelling Skin: Negative for color change or other new lesions Neurological: Negative for worsening tremors and other numbness  Psychiatric/Behavioral: Negative for worsening agitation or other fatigue All other system neg per pt    Objective:   Physical Exam BP 94/62 (BP Location: Right Arm, Patient Position: Sitting, Cuff Size: Large)   Temp 98.2 F (36.8 C) (Oral)   Ht 5\' 10"  (1.778 m)   Wt 205 lb (93 kg)   BMI 29.41 kg/m  VS noted, not ill appearing but appears and persistently fidgety in wheelchair Constitutional: Pt appears in NAD HENT: Head: NCAT.  Right Ear: External ear normal.  Left Ear: External ear normal.  Eyes: . Pupils are equal, round, and reactive to light. Conjunctivae and EOM are normal Nose: without d/c or deformity Neck: Neck supple. Gross normal ROM Cardiovascular: Normal rate and regular rhythm.   Pulmonary/Chest: Effort normal and breath sounds without rales or wheezing.  Abd:  Soft, NT, ND, + BS, no organomegaly Neurological: Pt is alert. At baseline  orientation, motor grossly intact Skin: Skin is warm. No rashes, other new lesions, s/p bilat leg ampuations Psychiatric: Pt behavior is mild nervous, fidgety No other exam findings    Assessment & Plan:

## 2017-07-10 NOTE — Patient Instructions (Addendum)
Ok to change the midodrine to 5 mg TWICE per day (some people take even three times per day)  Please continue all other medications as before, and refills have been done if requested.  Please have the pharmacy call with any other refills you may need.  Please keep your appointments with your specialists as you may have planned  You will be contacted regarding the referral for: [facility]) with nurse, physical therapy, AIDE and Speech Therapy evaluation  Please return in 3 months, or sooner if needed

## 2017-07-11 NOTE — Assessment & Plan Note (Addendum)
Appears likely persistently symptomatic on no HD days though some better with the midodrin. 10 qd on HD days since last ED visit.  Pt not otherwise on BP meds, no fever or GI symptoms except for recurring mild dysphagia without overt aspirations.  I speculate that his dysphagia in addition to his fluid restriction may be contributing to unintended low volume.  I asked the wife to inquire as to pt dry wt and possible loosening of fluid restriction and even higher dry wt on HD.  In the meantime, will need midrodrine 5 bid daily which should continue unless hypotension can be improved as above.  Pt will also have added ST evaluation and AIDE with his already established [facility] with RN and PT ongoing.  Pt not likely to benefit from repeat GI evaluation at this time, but could be considered.  Will also need [facility] assocation and will be referred.  Note:  Total time for pt hx, exam, review of record with pt in the room, determination of diagnoses and plan for further eval and tx is > 40 min, with over 50% spent in coordination and counseling of patient including the differential dx, tx, further evaluation and other management of hypotension, dysphagia, hx of stroke, DM and ESRD.

## 2017-07-11 NOTE — Assessment & Plan Note (Signed)
Lab Results  Component Value Date   HGBA1C 7.1 (H) 02/10/2017  declines f/u A1c today, will try to address at next visit

## 2017-07-11 NOTE — Assessment & Plan Note (Signed)
stable overall by history and exam, and pt to continue medical treatment as before,  to f/u any worsening symptoms or concerns 

## 2017-07-11 NOTE — Assessment & Plan Note (Signed)
To cont HD, wife to ask about higher dry wt

## 2017-07-12 ENCOUNTER — Ambulatory Visit (INDEPENDENT_AMBULATORY_CARE_PROVIDER_SITE_OTHER): Payer: Medicare Other | Admitting: *Deleted

## 2017-07-12 ENCOUNTER — Ambulatory Visit: Payer: Self-pay | Admitting: Family

## 2017-07-12 ENCOUNTER — Other Ambulatory Visit: Payer: Self-pay | Admitting: *Deleted

## 2017-07-12 DIAGNOSIS — Z8679 Personal history of other diseases of the circulatory system: Secondary | ICD-10-CM

## 2017-07-12 DIAGNOSIS — Z48812 Encounter for surgical aftercare following surgery on the circulatory system: Secondary | ICD-10-CM | POA: Diagnosis not present

## 2017-07-12 DIAGNOSIS — G459 Transient cerebral ischemic attack, unspecified: Secondary | ICD-10-CM | POA: Diagnosis not present

## 2017-07-12 DIAGNOSIS — Z5181 Encounter for therapeutic drug level monitoring: Secondary | ICD-10-CM

## 2017-07-12 DIAGNOSIS — I442 Atrioventricular block, complete: Secondary | ICD-10-CM | POA: Diagnosis not present

## 2017-07-12 DIAGNOSIS — I5042 Chronic combined systolic (congestive) and diastolic (congestive) heart failure: Secondary | ICD-10-CM | POA: Diagnosis not present

## 2017-07-12 DIAGNOSIS — E1122 Type 2 diabetes mellitus with diabetic chronic kidney disease: Secondary | ICD-10-CM | POA: Diagnosis not present

## 2017-07-12 DIAGNOSIS — I4891 Unspecified atrial fibrillation: Secondary | ICD-10-CM | POA: Diagnosis not present

## 2017-07-12 DIAGNOSIS — I132 Hypertensive heart and chronic kidney disease with heart failure and with stage 5 chronic kidney disease, or end stage renal disease: Secondary | ICD-10-CM | POA: Diagnosis not present

## 2017-07-12 DIAGNOSIS — N186 End stage renal disease: Secondary | ICD-10-CM | POA: Diagnosis not present

## 2017-07-12 LAB — CULTURE, BLOOD (ROUTINE X 2)
CULTURE: NO GROWTH
Culture: NO GROWTH
Special Requests: ADEQUATE
Special Requests: ADEQUATE

## 2017-07-12 LAB — POCT INR: INR: 2.1

## 2017-07-12 MED ORDER — ENOXAPARIN SODIUM 100 MG/ML ~~LOC~~ SOLN: 100.0000 mg | SUBCUTANEOUS | 1 refills | Status: DC

## 2017-07-12 NOTE — Patient Outreach (Signed)
[facility]) Care Management   07/12/2017  Oscar Castillo 1950-12-07 [id number]  Oscar Castillo is an 66 y.o. male  Subjective:   Member alert and oriented x3, denies any complaints of pain or discomfort at this time.  Wife report member is compliant with medications.  Objective:   Review of Systems  Constitutional: Negative.   HENT: Negative.   Eyes: Negative.   Respiratory: Negative.   Cardiovascular: Negative.   Gastrointestinal: Negative.   Genitourinary: Negative.   Musculoskeletal: Negative.   Skin: Negative.   Neurological: Negative.   Endo/Heme/Allergies: Negative.   Psychiatric/Behavioral: Negative.     Physical Exam  Constitutional: He is oriented to person, place, and time. He appears well-developed and well-nourished.  Neck: Normal range of motion.  Cardiovascular: Normal rate, regular rhythm and normal heart sounds.  Respiratory: Effort normal and breath sounds normal.  GI: Soft. Bowel sounds are normal.  Musculoskeletal: Normal range of motion.  Neurological: He is alert and oriented to person, place, and time.  Skin: Skin is warm and dry.   BP 100/62   Pulse 77   Resp 18   SpO2 97%   Encounter Medications:   Outpatient Encounter Medications as of 07/12/2017  Medication Sig  . acetaminophen (TYLENOL) 500 MG tablet Take 500 mg by mouth every 6 (six) hours as needed for mild pain.  [name] amiodarone (PACERONE) 200 MG tablet TAKE 1 TABLET BY MOUTH DAILY, START THIS ON 11/06/16 (Patient taking differently: TAKE 200 mg TABLET BY MOUTH DAILY, START THIS ON 11/06/16)  . atorvastatin (LIPITOR) 40 MG tablet TAKE 1 TABLET(40 MG) BY MOUTH DAILY AT 6 PM  . calcium acetate (PHOSLO) 667 MG capsule Take 1,334 mg by mouth 3 (three) times daily with meals.   . enoxaparin (LOVENOX) 100 MG/ML injection Inject 1 mL (100 mg total) into the skin daily.  [name] gabapentin (NEURONTIN) 300 MG capsule Take 1 capsule (300 mg total) by mouth 3 (three) times daily. 3 times a  day when necessary neuropathy pain  . latanoprost (XALATAN) 0.005 % ophthalmic solution Place 1 drop into both eyes at bedtime.   . pantoprazole (PROTONIX) 40 MG tablet Take 1 tablet (40 mg total) by mouth 2 (two) times daily before a meal.  . QUEtiapine (SEROQUEL) 25 MG tablet Take 0.5 tablets (12.5 mg total) by mouth at bedtime.  . senna (SENOKOT) 8.6 MG TABS tablet Take 1 tablet (8.6 mg total) by mouth daily.  . SENSIPAR 30 MG tablet Take 30 mg by mouth every evening.   . silver sulfADIAZINE (SILVADENE) 1 % cream Apply 1 application topically daily as needed (irratation).  . sodium phosphate (FLEET) 7-19 GM/118ML ENEM Place 133 mLs (1 enema total) rectally daily as needed for severe constipation.  . sorbitol 70 % SOLN Take 30 mLs by mouth 2 (two) times daily.  [name] SSD 1 % cream APPLY EXTERNALLY TO THE AFFECTED AREA DAILY  . traMADol (ULTRAM) 50 MG tablet Take 50 mg by mouth every 6 (six) hours as needed for moderate pain.  [name] warfarin (COUMADIN) 2.5 MG tablet Take as directed by Anticoagulation Clinic  . [DISCONTINUED] midodrine (PROAMATINE) 5 MG tablet Take 1 tablet (5 mg total) by mouth 2 (two) times daily with a meal.   No facility-administered encounter medications on file as of 07/12/2017.     Functional Status:   In your present state of health, do you have any difficulty performing the following activities: 06/22/2017 05/07/2017  Hearing? N N  Vision? N N  Difficulty concentrating or making decisions? N N  Walking or climbing stairs? Y Y  Dressing or bathing? N N  Doing errands, shopping? [place]  Preparing Food and eating ? N -  Using the Toilet? N -  In the past six months, have you accidently leaked urine? N -  Do you have problems with loss of bowel control? N -  Managing your Medications? N -  Managing your Finances? N -  Housekeeping or managing your Housekeeping? N -  Some recent data might be hidden    Fall/Depression Screening:    Fall Risk  06/22/2017 06/14/2017  02/10/2017  Falls in the past year? No No No  Number falls in past yr: - - -  Risk Factor Category  - - -  Risk for fall due to : Impaired mobility Impaired mobility Impaired mobility;Impaired balance/gait   PHQ 2/9 Scores 06/22/2017 06/14/2017 02/10/2017 03/11/2016 10/07/2015 07/10/2015 01/24/2015  PHQ - 2 Score _0 0 0 0 0  PHQ- 9 Score _1 - - - -    Assessment:    Met with member and wife at scheduled time.  Wife report she was unaware this care manager was coming to visit however grateful as she recently discussed with primary MD the difficulty managing member, requesting help in the home.  She is reminded of her (as well as the member's) conversation with LCSW regarding assistance in the home.  She recalls conversation and state she has the packet that was mailed to the home.  As she stated to the LCSW, she report not being eligible for Medicaid and not having the finances to pay out of pocket for additional help.  She report that the member was more helpful but is still trying to adjust to new prosthetic legs.  He does have physical therapy with [place], wife is hoping the member will be able to help with transfers better once his strength has increased and able to use prosthetics.    Use of SCAT services discussed as to decrease the toll of getting member in/out of the car for multiple appointments and dialysis treatments.  Advised that if SCAT is used, she will only have to help member out of bed and into wheelchair and the reverse upon arrival back home.  She state the member has refused to use SCAT.  She state member has been in a facility before, report she does not want to place him in facility unless absolutely necessary.  She does not have any family able to provide assistance.  Wife also concerned about member's recurrent low blood pressure.  Report that it is more difficult to manage member when he is hypotensive.  He was taking Midodrine only on dialysis days, but is now  taking twice a day.  Wife state she has health conditions of her own and is now having back problems due to having to lift/transfer member with no assistance.  She is advised that this care manager will discuss the need for additional help in the home, possible referral to home care providers.  She is also made aware that if approval is received to place referral, assistance is short term.  She verbalizes understanding, stating she is hopeful that member will have increased strength and will be of more help.  She denies any further questions.  Provided with [facility] calendar tool book, including community resources and 24 hour nurse triage line.  Provided with contact information for this care manager, advised to  contact with concerns.   Plan:   Will discuss case with supervisor regarding providing assistance short term through [facility]. Will follow up with member within the next month with routine home visit.  [facility] CM Care Plan Problem One     Most Recent Value  Care Plan Problem One  Risk for ED visit related to hypotension as evidenced by recent ED visit for condition  Role Documenting the Problem One  Care Management Coordinator  Care Plan for Problem One  Active  [facility] Long Term Goal   Member's blood pressure will remain within normal limits over the next 31 days  [facility] Long Term Goal Start Date  07/12/17  Interventions for Problem One Long Term Goal  Member and wife educated on importance of monitoring and documenting blood pressure in effort to monitor trends to allow MD to adjust medications as indicated  [facility] CM Short Term Goal #1   Wife will report medication compliance within the next 4 weeks  [facility] CM Short Term Goal #1 Start Date  07/12/17  Interventions for Short Term Goal #1  Member & wife educated on the importance of taking medication (specifically Midodrine) in effort to keep blood pressure elevated to adequate reading.    [facility] CM Care Plan Problem Two     Most Recent Value   Care Plan Problem Two  Risk for hospitalization related to caregiver burnout as evidenced by caregiver's request for help  Role Documenting the Problem Two  Care Management Coordinator  Care Plan for Problem Two  Active  [facility] CM Short Term Goal #1   Member's wife will have additional help in the home within the next 2 weeks  [facility] CM Short Term Goal #1 Start Date  07/12/17  Interventions for Short Term Goal #2   Member and wife educated on importance of adequate support in the home in order to care for member and decrease risk of complications due to caregiver burnout.  Will place referral to home care providers if approved by supervisor      [place], RN, MSN [facility] Care Management  Community Care Manager [phone]    

## 2017-07-12 NOTE — Patient Instructions (Addendum)
07/15/17: Last dose of Coumadin.  07/16/17: No Coumadin or Lovenox.  07/17/17: Inject Lovenox 100mg  in the fatty abdominal tissue at least 2 inches from the belly button at 4p rotate sites. No Coumadin.  07/18/17: Inject Lovenox in the fatty tissue at 4p. No Coumadin.  07/19/17: Inject Lovenox in the fatty tissue at 4p. No Coumadin.  07/20/17: No Lovenox and No Coumadin.  07/21/17: Procedure Day - No Lovenox - Resume Coumadin in the evening or as directed by doctor (take an extra half tablet with usual dose today).  07/22/17: Resume Lovenox inject in the fatty tissue at 8a and take Coumadin (take an extra half tablet with usual dose today).  07/23/17: Inject Lovenox in the fatty tissue at 8a and take Coumadin.  07/24/17: Inject Lovenox in the fatty tissue at 8a and take Coumadin.  07/25/17: Inject Lovenox in the fatty tissue at 8a and take Coumadin.  07/26/17: Inject Lovenox in the fatty tissue at 8a and take Coumadin. Coumadin appt to check INR.  Description   Continue same dose of coumadin 1 tablet (2.5mg ) daily. Refer to pt instructions for upcoming procedure & Lovenox Bridge.  Recheck INR in 1 week after procedure.

## 2017-07-13 ENCOUNTER — Telehealth: Payer: Self-pay | Admitting: *Deleted

## 2017-07-13 ENCOUNTER — Other Ambulatory Visit: Payer: Self-pay | Admitting: *Deleted

## 2017-07-13 DIAGNOSIS — D689 Coagulation defect, unspecified: Secondary | ICD-10-CM | POA: Diagnosis not present

## 2017-07-13 DIAGNOSIS — D631 Anemia in chronic kidney disease: Secondary | ICD-10-CM | POA: Diagnosis not present

## 2017-07-13 DIAGNOSIS — N186 End stage renal disease: Secondary | ICD-10-CM | POA: Diagnosis not present

## 2017-07-13 DIAGNOSIS — N2581 Secondary hyperparathyroidism of renal origin: Secondary | ICD-10-CM | POA: Diagnosis not present

## 2017-07-13 NOTE — Telephone Encounter (Signed)
Call to patient's wife and notified of change of surgery time. To be at [facility] admitting department on 07/21/17 at 8:30 am for 11 am case. Verbalized understanding.

## 2017-07-14 ENCOUNTER — Telehealth: Payer: Self-pay | Admitting: Internal Medicine

## 2017-07-14 NOTE — Telephone Encounter (Signed)
Copied from [place] [phone]. Topic: General - Other >> Jul 14, 2017  2:50 PM Darl Householder, RMA wrote: Reason for CRM: Alonna Minium from [facility] is calling to inform Dr. Ronnald Ramp that patient refused (Patient was very angry)  services and denies any problems swallowing, if patient agrees then she is recommending a modified barium swallow test and if that shoes that there is a problem then patient can be referred back to speech therapy if patient agrees , please return call to [place] [phone]

## 2017-07-15 ENCOUNTER — Encounter (HOSPITAL_COMMUNITY): Payer: Self-pay | Admitting: *Deleted

## 2017-07-15 DIAGNOSIS — N2581 Secondary hyperparathyroidism of renal origin: Secondary | ICD-10-CM | POA: Diagnosis not present

## 2017-07-15 DIAGNOSIS — D689 Coagulation defect, unspecified: Secondary | ICD-10-CM | POA: Diagnosis not present

## 2017-07-15 DIAGNOSIS — N186 End stage renal disease: Secondary | ICD-10-CM | POA: Diagnosis not present

## 2017-07-15 DIAGNOSIS — D631 Anemia in chronic kidney disease: Secondary | ICD-10-CM | POA: Diagnosis not present

## 2017-07-16 ENCOUNTER — Telehealth: Payer: Self-pay

## 2017-07-16 ENCOUNTER — Other Ambulatory Visit: Payer: Self-pay

## 2017-07-16 ENCOUNTER — Encounter (HOSPITAL_COMMUNITY): Payer: Self-pay | Admitting: *Deleted

## 2017-07-16 DIAGNOSIS — I132 Hypertensive heart and chronic kidney disease with heart failure and with stage 5 chronic kidney disease, or end stage renal disease: Secondary | ICD-10-CM | POA: Diagnosis not present

## 2017-07-16 DIAGNOSIS — Z48812 Encounter for surgical aftercare following surgery on the circulatory system: Secondary | ICD-10-CM | POA: Diagnosis not present

## 2017-07-16 DIAGNOSIS — E1122 Type 2 diabetes mellitus with diabetic chronic kidney disease: Secondary | ICD-10-CM | POA: Diagnosis not present

## 2017-07-16 DIAGNOSIS — N186 End stage renal disease: Secondary | ICD-10-CM | POA: Diagnosis not present

## 2017-07-16 DIAGNOSIS — I5042 Chronic combined systolic (congestive) and diastolic (congestive) heart failure: Secondary | ICD-10-CM | POA: Diagnosis not present

## 2017-07-16 DIAGNOSIS — I442 Atrioventricular block, complete: Secondary | ICD-10-CM | POA: Diagnosis not present

## 2017-07-16 NOTE — Telephone Encounter (Signed)
Informed patients wife, she stated that he is already being seen by cardiology and declined the referral. However she did say that she would speak with the dialysis MD and see if its any other options or suggestions.

## 2017-07-16 NOTE — Progress Notes (Signed)
Mr Mellinger reports that the last dose of Coumadin was 11/12/16, when she located instructions she realized that should have stopped it on the 20.  I instructed Ms Stavely to call the Coumadin and inform them and she if there any new instucttions,

## 2017-07-16 NOTE — Telephone Encounter (Signed)
Wife stated that the patients is still having issues with his BP being low. Please advise.    Copied from [place]. Topic: Inquiry >> Jul 16, 2017 11:56 AM Patrice Paradise wrote: Reason for CRM: Patient wife called and is requesting a call back from Dr. [name] or his assistant. They would like to discuss his blood pressure med midodrine (PROAMATINE) 5 MG tablet .

## 2017-07-16 NOTE — Telephone Encounter (Signed)
I have nothing further to offer for this , unfortunately unless he would want cardiology referral;  Pt should f/u with his Dialysis MD as well

## 2017-07-17 DIAGNOSIS — N186 End stage renal disease: Secondary | ICD-10-CM | POA: Diagnosis not present

## 2017-07-17 DIAGNOSIS — D689 Coagulation defect, unspecified: Secondary | ICD-10-CM | POA: Diagnosis not present

## 2017-07-17 DIAGNOSIS — D631 Anemia in chronic kidney disease: Secondary | ICD-10-CM | POA: Diagnosis not present

## 2017-07-17 DIAGNOSIS — N2581 Secondary hyperparathyroidism of renal origin: Secondary | ICD-10-CM | POA: Diagnosis not present

## 2017-07-19 ENCOUNTER — Other Ambulatory Visit: Payer: Self-pay | Admitting: *Deleted

## 2017-07-19 DIAGNOSIS — D631 Anemia in chronic kidney disease: Secondary | ICD-10-CM | POA: Diagnosis not present

## 2017-07-19 DIAGNOSIS — N2581 Secondary hyperparathyroidism of renal origin: Secondary | ICD-10-CM | POA: Diagnosis not present

## 2017-07-19 DIAGNOSIS — N186 End stage renal disease: Secondary | ICD-10-CM | POA: Diagnosis not present

## 2017-07-19 DIAGNOSIS — D689 Coagulation defect, unspecified: Secondary | ICD-10-CM | POA: Diagnosis not present

## 2017-07-19 NOTE — Patient Outreach (Signed)
[facility]) Care Management  07/19/2017  KORTEZ MURTAGH [birth year] [id number]   Call placed to wife/caregiver to inform her of referral to [facility]. She is reminded that the services will be short term only, advised to complete applications for CAPS in the meantime.  She verbalizes understanding, state she will try to complete by the end of the week.  Advised to contact this care manager or LCSW if she has questions regarding completion.  Will follow up within the next 2 weeks.  [name], [place], MSN [place] [phone]

## 2017-07-21 ENCOUNTER — Ambulatory Visit (HOSPITAL_COMMUNITY): Payer: Medicare Other | Admitting: Anesthesiology

## 2017-07-21 ENCOUNTER — Ambulatory Visit (HOSPITAL_COMMUNITY)
Admission: RE | Admit: 2017-07-21 | Discharge: 2017-07-21 | Disposition: A | Discharge status: Home / Self Care | Payer: Medicare Other | Source: Ambulatory Visit | Attending: Vascular Surgery | Admitting: Vascular Surgery

## 2017-07-21 ENCOUNTER — Encounter (HOSPITAL_COMMUNITY): Payer: Self-pay

## 2017-07-21 ENCOUNTER — Telehealth: Payer: Self-pay

## 2017-07-21 ENCOUNTER — Encounter (HOSPITAL_COMMUNITY)
Admission: RE | Disposition: A | Discharge status: Home / Self Care | Payer: Self-pay | Source: Ambulatory Visit | Attending: Vascular Surgery

## 2017-07-21 ENCOUNTER — Other Ambulatory Visit: Payer: Self-pay

## 2017-07-21 DIAGNOSIS — T82898A Other specified complication of vascular prosthetic devices, implants and grafts, initial encounter: Secondary | ICD-10-CM | POA: Diagnosis present

## 2017-07-21 DIAGNOSIS — Z992 Dependence on renal dialysis: Secondary | ICD-10-CM | POA: Insufficient documentation

## 2017-07-21 DIAGNOSIS — K219 Gastro-esophageal reflux disease without esophagitis: Secondary | ICD-10-CM | POA: Diagnosis not present

## 2017-07-21 DIAGNOSIS — Y832 Surgical operation with anastomosis, bypass or graft as the cause of abnormal reaction of the patient, or of later complication, without mention of misadventure at the time of the procedure: Secondary | ICD-10-CM | POA: Diagnosis not present

## 2017-07-21 DIAGNOSIS — I442 Atrioventricular block, complete: Secondary | ICD-10-CM | POA: Diagnosis not present

## 2017-07-21 DIAGNOSIS — F319 Bipolar disorder, unspecified: Secondary | ICD-10-CM | POA: Insufficient documentation

## 2017-07-21 DIAGNOSIS — N186 End stage renal disease: Secondary | ICD-10-CM | POA: Diagnosis not present

## 2017-07-21 DIAGNOSIS — Z7901 Long term (current) use of anticoagulants: Secondary | ICD-10-CM | POA: Diagnosis not present

## 2017-07-21 DIAGNOSIS — I480 Paroxysmal atrial fibrillation: Secondary | ICD-10-CM | POA: Diagnosis not present

## 2017-07-21 DIAGNOSIS — I120 Hypertensive chronic kidney disease with stage 5 chronic kidney disease or end stage renal disease: Secondary | ICD-10-CM | POA: Diagnosis not present

## 2017-07-21 DIAGNOSIS — I5043 Acute on chronic combined systolic (congestive) and diastolic (congestive) heart failure: Secondary | ICD-10-CM | POA: Diagnosis not present

## 2017-07-21 DIAGNOSIS — Z8673 Personal history of transient ischemic attack (TIA), and cerebral infarction without residual deficits: Secondary | ICD-10-CM | POA: Insufficient documentation

## 2017-07-21 DIAGNOSIS — E1151 Type 2 diabetes mellitus with diabetic peripheral angiopathy without gangrene: Secondary | ICD-10-CM | POA: Diagnosis not present

## 2017-07-21 DIAGNOSIS — T82868A Thrombosis of vascular prosthetic devices, implants and grafts, initial encounter: Secondary | ICD-10-CM | POA: Diagnosis not present

## 2017-07-21 DIAGNOSIS — Z950 Presence of cardiac pacemaker: Secondary | ICD-10-CM | POA: Diagnosis not present

## 2017-07-21 DIAGNOSIS — D631 Anemia in chronic kidney disease: Secondary | ICD-10-CM | POA: Insufficient documentation

## 2017-07-21 DIAGNOSIS — Z79899 Other long term (current) drug therapy: Secondary | ICD-10-CM | POA: Insufficient documentation

## 2017-07-21 DIAGNOSIS — N185 Chronic kidney disease, stage 5: Secondary | ICD-10-CM | POA: Diagnosis not present

## 2017-07-21 DIAGNOSIS — Z89511 Acquired absence of right leg below knee: Secondary | ICD-10-CM | POA: Insufficient documentation

## 2017-07-21 DIAGNOSIS — Z905 Acquired absence of kidney: Secondary | ICD-10-CM | POA: Insufficient documentation

## 2017-07-21 DIAGNOSIS — E1122 Type 2 diabetes mellitus with diabetic chronic kidney disease: Secondary | ICD-10-CM | POA: Diagnosis not present

## 2017-07-21 DIAGNOSIS — I132 Hypertensive heart and chronic kidney disease with heart failure and with stage 5 chronic kidney disease, or end stage renal disease: Secondary | ICD-10-CM | POA: Diagnosis not present

## 2017-07-21 DIAGNOSIS — Z89512 Acquired absence of left leg below knee: Secondary | ICD-10-CM | POA: Diagnosis not present

## 2017-07-21 HISTORY — PX: AV FISTULA PLACEMENT: SHX1204

## 2017-07-21 LAB — GLUCOSE, CAPILLARY: GLUCOSE-CAPILLARY: 124 mg/dL — AB (ref 65–99)

## 2017-07-21 LAB — PROTIME-INR
INR: 1.41
PROTHROMBIN TIME: 17.1 s — AB (ref 11.4–15.2)

## 2017-07-21 LAB — POCT I-STAT 4, (NA,K, GLUC, HGB,HCT)
Glucose, Bld: 159 mg/dL — ABNORMAL HIGH (ref 65–99)
HCT: 40 % (ref 39.0–52.0)
HEMOGLOBIN: 13.6 g/dL (ref 13.0–17.0)
POTASSIUM: 5.9 mmol/L — AB (ref 3.5–5.1)
Sodium: 132 mmol/L — ABNORMAL LOW (ref 135–145)

## 2017-07-21 SURGERY — INSERTION OF ARTERIOVENOUS (AV) GORE-TEX GRAFT ARM
Anesthesia: Monitor Anesthesia Care | Site: Arm Upper | Laterality: Left

## 2017-07-21 MED ORDER — ETOMIDATE 2 MG/ML IV SOLN
INTRAVENOUS | Status: AC
  Filled 2017-07-21: qty 10

## 2017-07-21 MED ORDER — PROMETHAZINE HCL 25 MG/ML IJ SOLN: 6.2500 mg | INTRAMUSCULAR | Status: DC | PRN

## 2017-07-21 MED ORDER — LIDOCAINE 2% (20 MG/ML) 5 ML SYRINGE
INTRAMUSCULAR | Status: AC
  Filled 2017-07-21: qty 5

## 2017-07-21 MED ORDER — SODIUM POLYSTYRENE SULFONATE PO POWD
30.0000 g | Freq: Once | ORAL | Status: AC
  Administered 2017-07-21: 30 g via ORAL
  Filled 2017-07-21: qty 30

## 2017-07-21 MED ORDER — FENTANYL CITRATE (PF) 100 MCG/2ML IJ SOLN: 25.0000 ug | INTRAMUSCULAR | Status: DC | PRN

## 2017-07-21 MED ORDER — LIDOCAINE HCL (PF) 1 % IJ SOLN
INTRAMUSCULAR | Status: AC
  Filled 2017-07-21: qty 30

## 2017-07-21 MED ORDER — SODIUM CHLORIDE 0.9 % IV SOLN
INTRAVENOUS | Status: DC | PRN
  Administered 2017-07-21: 11:00:00 via INTRAVENOUS

## 2017-07-21 MED ORDER — OXYCODONE-ACETAMINOPHEN 5-325 MG PO TABS: 1.0000 | ORAL_TABLET | Freq: Four times a day (QID) | ORAL | 0 refills | Status: DC | PRN

## 2017-07-21 MED ORDER — HEMOSTATIC AGENTS (NO CHARGE) OPTIME
TOPICAL | Status: DC | PRN
  Administered 2017-07-21: 1 via TOPICAL

## 2017-07-21 MED ORDER — VANCOMYCIN HCL IN DEXTROSE 1-5 GM/200ML-% IV SOLN
1000.0000 mg | Freq: Once | INTRAVENOUS | Status: AC
  Administered 2017-07-21: 1000 mg via INTRAVENOUS
  Filled 2017-07-21: qty 200

## 2017-07-21 MED ORDER — HEPARIN SODIUM (PORCINE) 1000 UNIT/ML IJ SOLN
2000.0000 [IU] | Freq: Once | INTRAMUSCULAR | Status: AC
  Administered 2017-07-21: 1900 [IU] via INTRAVENOUS
  Filled 2017-07-21: qty 2

## 2017-07-21 MED ORDER — SODIUM POLYSTYRENE SULFONATE 15 GM/60ML PO SUSP
30.0000 g | Freq: Once | ORAL | Status: DC
  Filled 2017-07-21 (×2): qty 120

## 2017-07-21 MED ORDER — PROPOFOL 500 MG/50ML IV EMUL
INTRAVENOUS | Status: DC | PRN
  Administered 2017-07-21: 60 ug/kg/min via INTRAVENOUS

## 2017-07-21 MED ORDER — FENTANYL CITRATE (PF) 250 MCG/5ML IJ SOLN
INTRAMUSCULAR | Status: AC
  Filled 2017-07-21: qty 5

## 2017-07-21 MED ORDER — MEPERIDINE HCL 25 MG/ML IJ SOLN: 6.2500 mg | INTRAMUSCULAR | Status: DC | PRN

## 2017-07-21 MED ORDER — MIDAZOLAM HCL 2 MG/2ML IJ SOLN: 0.5000 mg | Freq: Once | INTRAMUSCULAR | Status: DC | PRN

## 2017-07-21 MED ORDER — LIDOCAINE HCL (PF) 1 % IJ SOLN
INTRAMUSCULAR | Status: DC | PRN
  Administered 2017-07-21: 16 mL

## 2017-07-21 MED ORDER — HEPARIN SODIUM (PORCINE) 5000 UNIT/ML IJ SOLN
INTRAMUSCULAR | Status: DC | PRN
  Administered 2017-07-21: 12:00:00

## 2017-07-21 MED ORDER — SODIUM CHLORIDE 0.9 % IV SOLN: INTRAVENOUS | Status: DC

## 2017-07-21 MED ORDER — ONDANSETRON HCL 4 MG/2ML IJ SOLN
INTRAMUSCULAR | Status: AC
  Filled 2017-07-21: qty 2

## 2017-07-21 MED ORDER — 0.9 % SODIUM CHLORIDE (POUR BTL) OPTIME
TOPICAL | Status: DC | PRN
  Administered 2017-07-21: 1000 mL

## 2017-07-21 MED ORDER — DEXAMETHASONE SODIUM PHOSPHATE 10 MG/ML IJ SOLN
INTRAMUSCULAR | Status: AC
  Filled 2017-07-21: qty 1

## 2017-07-21 SURGICAL SUPPLY — 37 items
ADH SKN CLS APL DERMABOND .7 (GAUZE/BANDAGES/DRESSINGS) ×1
ADH SKN CLS LQ APL DERMABOND (GAUZE/BANDAGES/DRESSINGS) ×1
AGENT HMST SPONGE THK3/8 (HEMOSTASIS)
ARMBAND PINK RESTRICT EXTREMIT (MISCELLANEOUS) ×6 IMPLANT
CANISTER SUCT 3000ML PPV (MISCELLANEOUS) ×3 IMPLANT
CLIP VESOCCLUDE MED 6/CT (CLIP) ×3 IMPLANT
CLIP VESOCCLUDE SM WIDE 6/CT (CLIP) ×3 IMPLANT
COVER PROBE W GEL 5X96 (DRAPES) ×3 IMPLANT
DECANTER SPIKE VIAL GLASS SM (MISCELLANEOUS) ×3 IMPLANT
DERMABOND ADHESIVE PROPEN (GAUZE/BANDAGES/DRESSINGS) ×2
DERMABOND ADVANCED (GAUZE/BANDAGES/DRESSINGS) ×2
DERMABOND ADVANCED .7 DNX12 (GAUZE/BANDAGES/DRESSINGS) ×1 IMPLANT
DERMABOND ADVANCED .7 DNX6 (GAUZE/BANDAGES/DRESSINGS) IMPLANT
ELECT REM PT RETURN 9FT ADLT (ELECTROSURGICAL) ×3
ELECTRODE REM PT RTRN 9FT ADLT (ELECTROSURGICAL) ×1 IMPLANT
GLOVE BIO SURGEON STRL SZ7 (GLOVE) ×3 IMPLANT
GLOVE BIOGEL PI IND STRL 7.5 (GLOVE) ×1 IMPLANT
GLOVE BIOGEL PI INDICATOR 7.5 (GLOVE) ×2
GOWN STRL REUS W/ TWL LRG LVL3 (GOWN DISPOSABLE) ×3 IMPLANT
GOWN STRL REUS W/TWL LRG LVL3 (GOWN DISPOSABLE) ×9
HEMOSTAT SPONGE AVITENE ULTRA (HEMOSTASIS) IMPLANT
KIT BASIN OR (CUSTOM PROCEDURE TRAY) ×3 IMPLANT
KIT ROOM TURNOVER OR (KITS) ×3 IMPLANT
NS IRRIG 1000ML POUR BTL (IV SOLUTION) ×3 IMPLANT
PACK CV ACCESS (CUSTOM PROCEDURE TRAY) ×3 IMPLANT
PAD ARMBOARD 7.5X6 YLW CONV (MISCELLANEOUS) ×6 IMPLANT
SUT GORETEX 6.0 TT13 (SUTURE) IMPLANT
SUT MNCRL AB 4-0 PS2 18 (SUTURE) ×3 IMPLANT
SUT PROLENE 6 0 BV (SUTURE) ×3 IMPLANT
SUT PROLENE 7 0 BV 1 (SUTURE) IMPLANT
SUT SILK 2 0 PERMA HAND 18 BK (SUTURE) IMPLANT
SUT VIC AB 3-0 SH 27 (SUTURE) ×6
SUT VIC AB 3-0 SH 27X BRD (SUTURE) ×2 IMPLANT
SYR TOOMEY 50ML (SYRINGE) IMPLANT
TOWEL GREEN STERILE (TOWEL DISPOSABLE) ×3 IMPLANT
UNDERPAD 30X30 (UNDERPADS AND DIAPERS) ×3 IMPLANT
WATER STERILE IRR 1000ML POUR (IV SOLUTION) ×3 IMPLANT

## 2017-07-21 NOTE — Progress Notes (Signed)
Nurse notified that patient had "bugs" that resembled fleas on gown and around dialysis catheter.   Two nurses assessed patient and dialysis catheter and did not observe any bugs or fleas.   Skin within normal limits.  Patient's wiped with CHG cloths.   Dr. Bridgett Larsson made aware.

## 2017-07-21 NOTE — Anesthesia Preprocedure Evaluation (Addendum)
Anesthesia Evaluation  Patient identified by MRN, date of birth, ID bandGeneral Assessment Comment:Pt very sleepy from his gabapentin this am  Reviewed: Allergy & Precautions, NPO status , Patient's Chart, lab work & pertinent test results  History of Anesthesia Complications (+) history of anesthetic complications (complete heart block)  Airway Mallampati: II  TM Distance: >3 FB Neck ROM: Full    Dental  (+) Edentulous Upper, Poor Dentition, Missing, Dental Advisory Given   Pulmonary neg pulmonary ROS,    breath sounds clear to auscultation       Cardiovascular hypertension (no longer on BP meds), (-) angina+ Peripheral Vascular Disease (Bilat BKA)  + dysrhythmias Atrial Fibrillation + pacemaker (for 3rd degree Heart Block)  Rhythm:Regular Rate:Normal  10/18 ECHO: EF 55-60%, mod calcified aortic valve, no AS or AI, Mitral valve:  Severely calcified posterior mitral valve annulus, posterior MV leaflet is fixed. Mobility was not restricted. No MR or MS   Neuro/Psych Depression Bipolar Disorder Chronic back and leg pain TIACVA, No Residual Symptoms    GI/Hepatic GERD  Medicated and Controlled,(+) Hepatitis -  Endo/Other  negative endocrine ROSdiabetes  Renal/GU ESRF and DialysisRenal disease (TuThSa, K+ 5.9)     Musculoskeletal   Abdominal   Peds  Hematology Coumadin: Lovenox bridge   Anesthesia Other Findings   Reproductive/Obstetrics                          Anesthesia Physical Anesthesia Plan  ASA: III  Anesthesia Plan: MAC   Post-op Pain Management:    Induction: Intravenous  PONV Risk Score and Plan: 1 and Ondansetron and Dexamethasone  Airway Management Planned: Natural Airway and Simple Face Mask  Additional Equipment:   Intra-op Plan:   Post-operative Plan:   Informed Consent: I have reviewed the patients History and Physical, chart, labs and discussed the procedure including  the risks, benefits and alternatives for the proposed anesthesia with the patient or authorized representative who has indicated his/her understanding and acceptance.   Dental advisory given  Plan Discussed with: CRNA and Surgeon  Anesthesia Plan Comments: (Plan routine monitors, MAC Discussed with patient and his wife)       Anesthesia Quick Evaluation

## 2017-07-21 NOTE — Telephone Encounter (Signed)
Pt's wife called into clinic, reports pt did not have his surgery today as planned.  Dr Bridgett Larsson cancelled the surgery and did not reschedule it at this time.  Pt's wife was inquiring about what to do with the Lovenox bridge and Coumadin since surgery was cancelled.  Advised pt's wife to follow post surgery Lovenox bridge instructions with the only exception being that the pt could go ahead a resume the Lovenox once daily today.  Advised to take the Coumadin, as directed on instructions an extra 1/2 tablet x 2 dosages, then resume previous dosage regimen.  Continue Lovenox once daily and Coumadin daily until scheduled follow-up visit with Coumadin Clinic.  Pt's wife verbalized understanding.

## 2017-07-21 NOTE — Interval H&P Note (Signed)
   History and Physical Update  The patient was interviewed and re-examined.  The patient's previous History and Physical has been reviewed and is unchanged from my consult.  There is no change in the plan of care: left upper arm arteriovenous graft placement.   Risk, benefits, and alternatives to access surgery were discussed.    The patient is aware the risks include but are not limited to: bleeding, infection, steal syndrome, nerve damage, ischemic monomelic neuropathy, thrombosis, failure to mature, complications related to venous hypertension, need for additional procedures, death and stroke.    We have discussed previously that success will depend on availability of adequate axillary vein.  No adequate vein can be found, we will abort the procedure.  The patient agrees to proceed forward with the procedure.   Adele Barthel, MD, FACS [facility] [place] Office: [phone] Pager: [phone]  07/21/2017, 10:05 AM

## 2017-07-21 NOTE — Op Note (Signed)
    OPERATIVE NOTE   PROCEDURE: 1. Left axillary exploration  PRE-OPERATIVE DIAGNOSIS: end stage renal disease, thrombosed left arm fistula  POST-OPERATIVE DIAGNOSIS: same as above   SURGEON: Adele Barthel, MD  ASSISTANT(S): Gerri Lins, PAC   ANESTHESIA: local and MAC  ESTIMATED BLOOD LOSS: 50 cc  FINDING(S): 1.  No adequate axillary vein upon exploration 2.  Only residual brachial vein <3 mm  SPECIMEN(S):  none  INDICATIONS:   Oscar Castillo is a 66 y.o. male who presents with end stage renal disease and recently thrombosed left arm upper arm arteriovenous fistula.  On Sonosite, there appeared to be an adequate axillary vein for attempt at left upper arm arteriovenous graft. Risk, benefits, and alternatives to access surgery were discussed.  The patient is aware the risks include but are not limited to: bleeding, infection, steal syndrome, nerve damage, ischemic monomelic neuropathy, thrombosis, failure to mature, complications related to venous hypertension, need for additional procedures, death and stroke.  The patient agrees to proceed forward with the procedure.  DESCRIPTION: After obtaining full informed written consent, the patient was brought back to the operating room and placed supine upon the operating table.  The patient received IV antibiotics prior to induction.  A procedure time out was completed and the correct surgical site was verified.  After obtaining adequate anesthesia, the patient was prepped and draped in the standard fashion for: left arm access.  Under Sonosite, the only compressible vein was a vein deep and medial to the brachial artery.  I appeared to taper proximally, which raised concerns that there was central narrowing.    I injected a total of 10 cc of 1% lidocaine without epinephrine in the axilla and over the brachial artery distally.  I made an incision over the axillary/high brachial vein.  I dissected out the brachial artery first and mobilized  it.  This allowed me access to a brachial vein deep to the median nerve.  I dissected out this vein proximally and distally.  While it appeared to be 4 mm distally, proximally it tapered down to <3 mm proximally, making it inadequate for access purposes.   I explored the segment leading to the axillary vein and cord like structures were found, likely a brachial vein and basilic vein leading me to believe the dissected brachial vein is the only remaining feeding vein to the axilla.  I did not find another vein adequate for access purposes, so I elected to abort this procedure.  The wound was washed out and packed with Avitene.  After a few minutes, the Avitene was removed and the wound washed out.  No further active bleeding was present.  The axilla was repaired with a running stitch of 3-0 Vicryl in the subcutaneous tissue.  The skin was reapproximated with a running subcuticular stitch of 4-0 Monocryl.  The skin was cleaned, dried, and reinforced with Dermabond.  The patient will return to the office in 4 weeks for re-evaluation for right arm access.   COMPLICATIONS: none  CONDITION: stable   Adele Barthel, MD, [facility] [place] Office: [phone] Pager: [phone]  07/21/2017, 11:41 AM

## 2017-07-21 NOTE — Anesthesia Postprocedure Evaluation (Signed)
Anesthesia Post Note  Patient: KHIYAN CRACE  Procedure(s) Performed: Attempt INSERTION OF ARTERIOVENOUS (AV) WITH  ARTEGRAFT Left  UPPER ARM (Left Arm Upper)     Patient location during evaluation: PACU Anesthesia Type: MAC Level of consciousness: awake and alert, oriented and patient cooperative Pain management: pain level controlled Vital Signs Assessment: post-procedure vital signs reviewed and stable Respiratory status: spontaneous breathing, nonlabored ventilation, respiratory function stable and patient connected to nasal cannula oxygen Cardiovascular status: blood pressure returned to baseline and stable Postop Assessment: no apparent nausea or vomiting Anesthetic complications: no    Last Vitals:  Vitals:   07/21/17 1511 07/21/17 1520  BP:    Pulse: 60   Resp: 13   Temp:  (!) 36.3 C  SpO2: 100%     Last Pain:  Vitals:   07/21/17 1200  TempSrc:   PainSc: Asleep                 Lonnell Chaput,E. Adalind Weitz

## 2017-07-21 NOTE — Transfer of Care (Signed)
Immediate Anesthesia Transfer of Care Note  Patient: Oscar Castillo  Procedure(s) Performed: Attempt INSERTION OF ARTERIOVENOUS (AV) WITH  ARTEGRAFT Left  UPPER ARM (Left Arm Upper)  Patient Location: PACU  Anesthesia Type:MAC  Level of Consciousness: drowsy  Airway & Oxygen Therapy: Patient Spontanous Breathing and Patient connected to nasal cannula oxygen  Post-op Assessment: Report given to RN, Post -op Vital signs reviewed and stable and Patient moving all extremities  Post vital signs: Reviewed and stable  Last Vitals:  Vitals:   07/21/17 0940  BP: (!) 143/97  Pulse: 76  Resp: 18  Temp: 36.6 C  SpO2: 99%    Last Pain:  Vitals:   07/21/17 0940  TempSrc: Oral      Patients Stated Pain Goal: 2 (01/60/10 9323)  Complications: No apparent anesthesia complications

## 2017-07-22 ENCOUNTER — Telehealth: Payer: Self-pay | Admitting: Vascular Surgery

## 2017-07-22 ENCOUNTER — Encounter (HOSPITAL_COMMUNITY): Payer: Self-pay | Admitting: Vascular Surgery

## 2017-07-22 DIAGNOSIS — D631 Anemia in chronic kidney disease: Secondary | ICD-10-CM | POA: Diagnosis not present

## 2017-07-22 DIAGNOSIS — I4891 Unspecified atrial fibrillation: Secondary | ICD-10-CM | POA: Diagnosis not present

## 2017-07-22 DIAGNOSIS — N2581 Secondary hyperparathyroidism of renal origin: Secondary | ICD-10-CM | POA: Diagnosis not present

## 2017-07-22 DIAGNOSIS — D689 Coagulation defect, unspecified: Secondary | ICD-10-CM | POA: Diagnosis not present

## 2017-07-22 DIAGNOSIS — N186 End stage renal disease: Secondary | ICD-10-CM | POA: Diagnosis not present

## 2017-07-22 DIAGNOSIS — Z5181 Encounter for therapeutic drug level monitoring: Secondary | ICD-10-CM | POA: Diagnosis not present

## 2017-07-22 NOTE — Telephone Encounter (Signed)
spoke to pts spouse for appts, mailed letters 07/22/17 bg

## 2017-07-22 NOTE — Telephone Encounter (Signed)
-----   Message from Mena Goes, RN sent at 07/21/2017 10:30 PM EST ----- Regarding: 4 weeks w/ 2 labs for preop access surgery   ----- Message ----- From: Ulyses Amor, PA-C Sent: 07/21/2017  12:00 PM To: [facility] Charge Pool  S/P left UE av graft attempted.  Needs f/u in 4 weeks with right arm vein mapping and arterial duplex for preporation to try access in the right arm av fistula verses graft.

## 2017-07-24 DIAGNOSIS — N2581 Secondary hyperparathyroidism of renal origin: Secondary | ICD-10-CM | POA: Diagnosis not present

## 2017-07-24 DIAGNOSIS — D631 Anemia in chronic kidney disease: Secondary | ICD-10-CM | POA: Diagnosis not present

## 2017-07-24 DIAGNOSIS — N186 End stage renal disease: Secondary | ICD-10-CM | POA: Diagnosis not present

## 2017-07-24 DIAGNOSIS — D689 Coagulation defect, unspecified: Secondary | ICD-10-CM | POA: Diagnosis not present

## 2017-07-25 ENCOUNTER — Encounter: Payer: Self-pay | Admitting: *Deleted

## 2017-07-26 ENCOUNTER — Ambulatory Visit (INDEPENDENT_AMBULATORY_CARE_PROVIDER_SITE_OTHER): Payer: Medicare Other | Admitting: Pharmacist

## 2017-07-26 DIAGNOSIS — G459 Transient cerebral ischemic attack, unspecified: Secondary | ICD-10-CM

## 2017-07-26 DIAGNOSIS — Z5181 Encounter for therapeutic drug level monitoring: Secondary | ICD-10-CM

## 2017-07-26 DIAGNOSIS — Z992 Dependence on renal dialysis: Secondary | ICD-10-CM | POA: Diagnosis not present

## 2017-07-26 DIAGNOSIS — Z8679 Personal history of other diseases of the circulatory system: Secondary | ICD-10-CM

## 2017-07-26 DIAGNOSIS — D689 Coagulation defect, unspecified: Secondary | ICD-10-CM | POA: Diagnosis not present

## 2017-07-26 DIAGNOSIS — I4891 Unspecified atrial fibrillation: Secondary | ICD-10-CM | POA: Diagnosis not present

## 2017-07-26 DIAGNOSIS — E1122 Type 2 diabetes mellitus with diabetic chronic kidney disease: Secondary | ICD-10-CM | POA: Diagnosis not present

## 2017-07-26 DIAGNOSIS — N2581 Secondary hyperparathyroidism of renal origin: Secondary | ICD-10-CM | POA: Diagnosis not present

## 2017-07-26 DIAGNOSIS — D631 Anemia in chronic kidney disease: Secondary | ICD-10-CM | POA: Diagnosis not present

## 2017-07-26 DIAGNOSIS — N186 End stage renal disease: Secondary | ICD-10-CM | POA: Diagnosis not present

## 2017-07-26 LAB — POCT INR: INR: 1.2

## 2017-07-26 NOTE — Patient Instructions (Signed)
Continue taking lovenox shots. Take 2 tablets today, Tuesday, and Wednesday. Then continue 1 tablet daily.  Recheck INR on Friday.

## 2017-07-29 DIAGNOSIS — D689 Coagulation defect, unspecified: Secondary | ICD-10-CM | POA: Diagnosis not present

## 2017-07-29 DIAGNOSIS — E1122 Type 2 diabetes mellitus with diabetic chronic kidney disease: Secondary | ICD-10-CM | POA: Diagnosis not present

## 2017-07-29 DIAGNOSIS — N2581 Secondary hyperparathyroidism of renal origin: Secondary | ICD-10-CM | POA: Diagnosis not present

## 2017-07-29 DIAGNOSIS — D631 Anemia in chronic kidney disease: Secondary | ICD-10-CM | POA: Diagnosis not present

## 2017-07-29 DIAGNOSIS — N186 End stage renal disease: Secondary | ICD-10-CM | POA: Diagnosis not present

## 2017-07-29 DIAGNOSIS — D509 Iron deficiency anemia, unspecified: Secondary | ICD-10-CM | POA: Diagnosis not present

## 2017-07-30 ENCOUNTER — Encounter (INDEPENDENT_AMBULATORY_CARE_PROVIDER_SITE_OTHER): Payer: Self-pay | Admitting: Orthopedic Surgery

## 2017-07-30 ENCOUNTER — Ambulatory Visit (INDEPENDENT_AMBULATORY_CARE_PROVIDER_SITE_OTHER): Payer: Medicare Other | Admitting: Orthopedic Surgery

## 2017-07-30 ENCOUNTER — Ambulatory Visit (INDEPENDENT_AMBULATORY_CARE_PROVIDER_SITE_OTHER): Payer: Medicare Other | Admitting: *Deleted

## 2017-07-30 DIAGNOSIS — G459 Transient cerebral ischemic attack, unspecified: Secondary | ICD-10-CM

## 2017-07-30 DIAGNOSIS — I4891 Unspecified atrial fibrillation: Secondary | ICD-10-CM | POA: Diagnosis not present

## 2017-07-30 DIAGNOSIS — Z89512 Acquired absence of left leg below knee: Secondary | ICD-10-CM | POA: Diagnosis not present

## 2017-07-30 DIAGNOSIS — Z89511 Acquired absence of right leg below knee: Secondary | ICD-10-CM

## 2017-07-30 DIAGNOSIS — L97221 Non-pressure chronic ulcer of left calf limited to breakdown of skin: Secondary | ICD-10-CM

## 2017-07-30 DIAGNOSIS — Z8679 Personal history of other diseases of the circulatory system: Secondary | ICD-10-CM | POA: Diagnosis not present

## 2017-07-30 DIAGNOSIS — Z5181 Encounter for therapeutic drug level monitoring: Secondary | ICD-10-CM | POA: Diagnosis not present

## 2017-07-30 LAB — POCT INR: INR: 2.1

## 2017-07-30 MED ORDER — SILVER SULFADIAZINE 1 % EX CREA: TOPICAL_CREAM | CUTANEOUS | 0 refills | Status: DC

## 2017-07-30 NOTE — Patient Instructions (Signed)
Description   Continue 1 tablet daily.  Stop taking Lovenox injections. Recheck INR in 2 weeks.

## 2017-07-30 NOTE — Progress Notes (Signed)
Office Visit Note   Patient: Oscar Castillo           Date of Birth: July 31, 1950           MRN: [id number] Visit Date: 07/30/2017              Requested by: [name], MD [address] [place], [place] [address] PCP: [name], MD  Chief Complaint  Patient presents with  . Left Leg - Pain      HPI: Patient is a 67 year old gentleman who is seen for evaluation for pain to left residual limb. Is s/p bilateral transtibial amputations.  Patient has a knee flexion contracture he just had his sockets modified with [employer].  Patient states that he cannot sleep at night he has pain even to lightly touch his legs.  Was started on gabapentin at last visit for neuropathic pain, is having continued pain. Some relief with this. Makes him drowsy, only taking bid.  Patient is diabetic with end-stage renal disease on dialysis Tuesday Thursday Saturday he is on Coumadin.  Assessment & Plan: Visit Diagnoses:  No diagnosis found.  Plan: continue on Neurontin 300 mg nightly and increase up to 3 times a day as needed.  He will slowly increase the amount of time that he is wearing his prosthesis. Begin silvadene dressings to distal L BKA ulcer.  Follow-Up Instructions: No Follow-up on file.   Ortho Exam  Patient is alert, oriented, no adenopathy, well-dressed, normal affect, normal respiratory effort. Examination patient is ambulating in a wheelchair.  He has a short transtibial ectasia in the right and a mid-level amputation on the left.  Patient has a fixed flexion contracture of both knees.  There is no effusion in either knee there is no tenderness to palpation along the joint lines bilaterally.  Patient has no redness no cellulitis no swelling no signs of infection.  Patient's legs are cool to the touch. The left BKA has distal ulcer from endbearing, is 10 mm in diameter, no depth. Covered with eschar. Tender to touch. No drainage or surrounding erythema. Scant dried blood. Patient  does have hypersensitivity to light touch as well consistent more with a neuropathy pain.  Imaging: No results found. No images are attached to the encounter.  Labs: Lab Results  Component Value Date   HGBA1C 7.1 (H) 02/10/2017   HGBA1C 6.4 (H) 09/15/2016   HGBA1C 6.2 12/11/2015   ESRSEDRATE 27 (H) 05/09/2013   CRP 6.7 (H) 05/09/2013   REPTSTATUS 07/12/2017 FINAL 07/07/2017   GRAMSTAIN  11/09/2016    FEW WBC PRESENT,BOTH PMN AND MONONUCLEAR NO ORGANISMS SEEN    CULT NO GROWTH 5 DAYS 07/07/2017   LABORGA STAPHYLOCOCCUS AUREUS 11/09/2016    @LABSALLVALUES (HGBA1)@  There is no height or weight on file to calculate BMI.  Orders:  No orders of the defined types were placed in this encounter.  No orders of the defined types were placed in this encounter.    Procedures: No procedures performed  Clinical Data: No additional findings.  ROS:  All other systems negative, except as noted in the HPI. Review of Systems  Constitutional: Negative for chills and fever.  Cardiovascular: Negative for leg swelling.  Skin: Positive for wound. Negative for color change.    Objective: Vital Signs: There were no vitals taken for this visit.  Specialty Comments:  No specialty comments available.  PMFS History: Patient Active Problem List   Diagnosis Date Noted  . Dysphagia 07/10/2017  .  Sacral ulcer ([place]) 05/07/2017  . CHB (complete heart block) ([place]) 04/29/2017  . Heart block AV third degree ([place]) 04/29/2017  . Complete heart block ([place])   . PAD (peripheral artery disease) ([place])   . General weakness 02/10/2017  . Gait disorder 02/10/2017  . MSSA bacteremia   . Sepsis ([place]) 11/08/2016  . Encounter for therapeutic drug monitoring 10/07/2016  . Diabetes mellitus with complication ([place])   . Anemia due to stage 5 chronic kidney disease ([place])   . Hypotension   . Melena 09/26/2016  . Pressure injury of skin 09/26/2016  . Benign neoplasm of descending colon   .  Gastroesophageal reflux disease with esophagitis   . Duodenal ulcer without hemorrhage or perforation   . Blood loss anemia 09/15/2016  . Paroxysmal atrial fibrillation ([place]) 09/15/2016  . Heme positive stool 09/15/2016  . RUQ pain 07/12/2016  . Hyperkalemia 07/01/2016  . Anemia 12/31/2015  . Glaucoma 12/27/2015  . LBBB (left bundle branch block)   . Rash 11/15/2015  . Cramping of hands 11/15/2015  . Liver fibrosis 10/07/2015  . Diarrhea 08/14/2014  . Dehydration 10/02/2013  . Fever 10/02/2013  . Altered mental status 10/01/2013  . Encephalopathy, toxic 10/01/2013  . Encephalopathy, metabolic 82/50/5397  . History of stroke 09/28/2013  . FTT (failure to thrive) in adult 09/28/2013  . Acute confusional state 09/28/2013  . Ulcer of sacral region, stage 3 ([place]) 09/26/2013  . S/P BKA (below knee amputation) bilateral ([place]) 09/08/2013  . ESRD on hemodialysis ([place]) 05/09/2013  . TIA (transient ischemic attack) 02/20/2013  . Acute blood loss anemia 10/28/2012  . Acute on chronic combined systolic and diastolic CHF (congestive heart failure) ([place]) 10/28/2012  . Constipation 10/28/2012  . GI bleed 10/27/2012  . Mass in rectum 10/27/2012  . Type 2 diabetes mellitus with chronic kidney disease on chronic dialysis, with long-term current use of insulin ([place]) 09/08/2012  . LVH (left ventricular hypertrophy)-severe concentric 06/13/2012  . Hyperlipidemia 01/30/2011  . CHOLELITHIASIS 08/01/2010  . BENIGN PROSTATIC HYPERTROPHY 08/01/2010  . History of cardiovascular disorder 08/06/2009  . Depression 03/18/2009  . NEPHROLITHIASIS, HX OF 03/18/2009  . Morbid obesity ([place]) 03/25/2007  . Hypertensive heart disease with CHF (congestive heart failure) ([place]) 03/25/2007  . GERD 03/25/2007  . Chronic hepatitis C without hepatic coma ([place]) 03/25/2007   Past Medical History:  Diagnosis Date  . Anemia   . Antral ulcer 2014   small  . BENIGN PROSTATIC HYPERTROPHY   . Bipolar disorder ([place])     "sometimes" (10/07/2016)  . CHOLELITHIASIS   . Chronic combined systolic and diastolic CHF (congestive heart failure) ([place])   . Complication of anesthesia    wife states pt had trouble waking up with in Nov., 2014  . CVA (cerebral vascular accident) ([place]) 07/2007   No residual effect  . DEPRESSION   . DIABETES MELLITUS, TYPE II    diet control.  No medication  since November 2015  . ERECTILE DYSFUNCTION   . ESRD on hemodialysis ([place])    ESRD due to DM/HTN. .  HD TTS at [facility] on [address].  [name] GERD   . GI bleed    due to gastritis, discharged 10/02/16/notes 10/07/2016  . Hepatitis C    C - has been treated  . History of Clostridium difficile   . History of kidney stones   . HYPERTENSION   . LBBB (left bundle branch block)   . Morbid obesity ([place])   . PAF (paroxysmal atrial fibrillation) (  [name])    a. Dx 12/2015.    Family History  Problem Relation Age of Onset  . Diabetes Mother   . Hypertension Mother   . Heart attack Father   . Hypertension Father   . Coronary artery disease Other     Past Surgical History:  Procedure Laterality Date  . AMPUTATION Left 05/12/2013   Procedure: AMPUTATION RAY;  Surgeon: Newt Minion, MD;  Location: [facility];  Service: Orthopedics;  Laterality: Left;  Left Foot 1st Ray Amputation  . AMPUTATION Left 06/09/2013   Procedure: AMPUTATION BELOW KNEE;  Surgeon: Newt Minion, MD;  Location: [facility];  Service: Orthopedics;  Laterality: Left;  Left Below Knee Amputation and removal proximal screws IM tibial nail  . AMPUTATION Right 09/08/2013   Procedure: AMPUTATION BELOW KNEE;  Surgeon: Newt Minion, MD;  Location: [place];  Service: Orthopedics;  Laterality: Right;  Right Below Knee Amputation  . AMPUTATION Right 10/11/2013   Procedure: AMPUTATION BELOW KNEE;  Surgeon: Newt Minion, MD;  Location: [facility];  Service: Orthopedics;  Laterality: Right;  Right Below Knee Amputation Revision  . AV FISTULA PLACEMENT  06/14/2012   Procedure: ARTERIOVENOUS (AV)  FISTULA CREATION;  Surgeon: Angelia Mould, MD;  Location[facility] OR;  Service: Vascular;  Laterality: Left;  Left basilic vein transposition with fistula.  . AV FISTULA PLACEMENT Left 07/21/2017   Procedure: Attempt INSERTION OF ARTERIOVENOUS (AV) WITH  ARTEGRAFT Left  UPPER ARM;  Surgeon: Conrad Dellroy, MD;  Location: [facility];  Service: Vascular;  Laterality: Left;  . BASCILIC VEIN TRANSPOSITION Left 02/17/2017   Procedure: LEFT 1ST STAGE BRACHIAL VEIN TRANSPOSITION;  Surgeon: Conrad Claypool, MD;  Location: [facility];  Service: Vascular;  Laterality: Left;  . BASCILIC VEIN TRANSPOSITION Left 04/21/2017   Procedure: LEFT 2ND STAGE BRACHIAL VEIN TRANSPOSITION;  Surgeon: Conrad Walthall, MD;  Location: [facility];  Service: Vascular;  Laterality: Left;  . COLONOSCOPY N/A 10/28/2012   Procedure: COLONOSCOPY;  Surgeon: Jeryl Columbia, MD;  Location[facility] ENDOSCOPY;  Service: Endoscopy;  Laterality: N/A;  . COLONOSCOPY N/A 11/02/2012   Procedure: COLONOSCOPY;  Surgeon: Cleotis Nipper, MD;  Location[facility]COPY;  Service: Endoscopy;  Laterality: N/A;  . COLONOSCOPY N/A 11/03/2012   Procedure: COLONOSCOPY;  Surgeon: Cleotis Nipper, MD;  Location: [facility]COPY;  Service: Endoscopy;  Laterality: N/A;  . COLONOSCOPY N/A 09/16/2016   Procedure: COLONOSCOPY;  Surgeon: Jerene Bears, MD;  Location: [facility] ENDOSCOPY;  Service: Gastroenterology;  Laterality: N/A;  . ENTEROSCOPY N/A 11/08/2012   Procedure: ENTEROSCOPY;  Surgeon: Wonda Horner, MD;  Location: [facility] ENDOSCOPY;  Service: Endoscopy;  Laterality: N/A;  . ESOPHAGOGASTRODUODENOSCOPY N/A 11/02/2012   Procedure: ESOPHAGOGASTRODUODENOSCOPY (EGD);  Surgeon: Cleotis Nipper, MD;  Location: [facility] ENDOSCOPY;  Service: Endoscopy;  Laterality: N/A;  . ESOPHAGOGASTRODUODENOSCOPY (EGD) WITH PROPOFOL N/A 09/16/2016   Procedure: ESOPHAGOGASTRODUODENOSCOPY (EGD) WITH PROPOFOL;  Surgeon: Jerene Bears, MD;  Location: [facility] ENDOSCOPY;  Service: Gastroenterology;  Laterality: N/A;  . EXCHANGE OF A  DIALYSIS CATHETER Left 11/13/2016   Procedure: EXCHANGE OF A DIALYSIS CATHETER;  Surgeon: Elam Dutch, MD;  Location: [place];  Service: Vascular;  Laterality: Left;  . EYE SURGERY Left    to remove scar tissue  . GIVENS CAPSULE STUDY N/A 11/04/2012   Procedure: GIVENS CAPSULE STUDY;  Surgeon: Cleotis Nipper, MD;  Location: [facility] ENDOSCOPY;  Service: Endoscopy;  Laterality: N/A;  . GIVENS CAPSULE STUDY N/A 09/29/2016   Procedure: GIVENS  CAPSULE STUDY;  Surgeon: Manus Gunning, MD;  Location: [place];  Service: Gastroenterology;  Laterality: N/A;  try to keep pt up in bedside chair as much as possible during the study.    [name] HARDWARE REMOVAL Left 06/09/2013   Procedure: HARDWARE REMOVAL;  Surgeon: Newt Minion, MD;  Location: [facility];  Service: Orthopedics;  Laterality: Left;  Left Below Knee Amputation  and Removal proximal screws IM tibial nail  . HEMATOMA EVACUATION Left 04/29/2017   Procedure: EVACUATION HEMATOMA LEFT ARM;  Surgeon: Conrad Walcott, MD;  Location: [facility];  Service: Vascular;  Laterality: Left;  . INSERTION OF DIALYSIS CATHETER N/A 11/09/2016   Procedure: INSERTION OF TEMPORARY DIALYSIS CATHETER;  Surgeon: Elam Dutch, MD;  Location: [place];  Service: Vascular;  Laterality: N/A;  . IR GENERIC HISTORICAL  09/27/2016   IR US GUIDE VASC ACCESS RIGHT 09/27/2016 Aletta Edouard, MD [facility]INTER[facility]  . IR GENERIC HISTORICAL  09/27/2016   IR FLUORO GUIDE CV LINE RIGHT 09/27/2016 Aletta Edouard, MD [facility]  . LIGATION OF ARTERIOVENOUS  FISTULA Left 11/09/2016   Procedure: LIGATION OF ARTERIOVENOUS  FISTULA;  Surgeon: Elam Dutch, MD;  Location: [place];  Service: Vascular;  Laterality: Left;  . ORIF FIBULA FRACTURE Left 09/09/2012   Procedure: OPEN REDUCTION INTERNAL FIXATION (ORIF) FIBULA FRACTURE;  Surgeon: Johnny Bridge, MD;  Location: [place];  Service: Orthopedics;  Laterality: Left;  . PACEMAKER IMPLANT N/A 04/30/2017   Procedure: PACEMAKER IMPLANT;  Surgeon: Evans Lance, MD;  Location: [facility];  Service: Cardiovascular;  Laterality: N/A;  . TEMPORARY PACEMAKER N/A 04/29/2017   Procedure: TEMPORARY PACEMAKER;  Surgeon: Lorretta Harp, MD;  Location: [facility] CV LAB;  Service: Cardiovascular;  Laterality: N/A;  . TIBIA IM NAIL INSERTION Left 09/09/2012   Procedure: INTRAMEDULLARY (IM) NAIL TIBIAL;  Surgeon: Johnny Bridge, MD;  Location: [facility];  Service: Orthopedics;  Laterality: Left;  left tibial nail and open reduction internal fixation left fibula fracture  . UPPER EXTREMITY VENOGRAPHY N/A 12/14/2016   Procedure: Bilateral Upper Extremity Venography and Central Venography;  Surgeon: Conrad [place], MD;  Location: [facility];  Service: Cardiovascular;  Laterality: N/A;   Social History   Occupational History  . Occupation: disabled due to stroke    Employer: RETIRED  Tobacco Use  . Smoking status: Never Smoker  . Smokeless tobacco: Never Used  Substance and Sexual Activity  . Alcohol use: No  . Drug use: No  . Sexual activity: Yes    Birth control/protection: None

## 2017-07-31 DIAGNOSIS — D509 Iron deficiency anemia, unspecified: Secondary | ICD-10-CM | POA: Diagnosis not present

## 2017-07-31 DIAGNOSIS — N2581 Secondary hyperparathyroidism of renal origin: Secondary | ICD-10-CM | POA: Diagnosis not present

## 2017-07-31 DIAGNOSIS — E1122 Type 2 diabetes mellitus with diabetic chronic kidney disease: Secondary | ICD-10-CM | POA: Diagnosis not present

## 2017-07-31 DIAGNOSIS — D689 Coagulation defect, unspecified: Secondary | ICD-10-CM | POA: Diagnosis not present

## 2017-07-31 DIAGNOSIS — D631 Anemia in chronic kidney disease: Secondary | ICD-10-CM | POA: Diagnosis not present

## 2017-07-31 DIAGNOSIS — N186 End stage renal disease: Secondary | ICD-10-CM | POA: Diagnosis not present

## 2017-08-03 ENCOUNTER — Other Ambulatory Visit: Payer: Self-pay

## 2017-08-03 DIAGNOSIS — E1122 Type 2 diabetes mellitus with diabetic chronic kidney disease: Secondary | ICD-10-CM | POA: Diagnosis not present

## 2017-08-03 DIAGNOSIS — D509 Iron deficiency anemia, unspecified: Secondary | ICD-10-CM | POA: Diagnosis not present

## 2017-08-03 DIAGNOSIS — D631 Anemia in chronic kidney disease: Secondary | ICD-10-CM | POA: Diagnosis not present

## 2017-08-03 DIAGNOSIS — N2581 Secondary hyperparathyroidism of renal origin: Secondary | ICD-10-CM | POA: Diagnosis not present

## 2017-08-03 DIAGNOSIS — D689 Coagulation defect, unspecified: Secondary | ICD-10-CM | POA: Diagnosis not present

## 2017-08-03 DIAGNOSIS — N186 End stage renal disease: Secondary | ICD-10-CM | POA: Diagnosis not present

## 2017-08-03 DIAGNOSIS — Z992 Dependence on renal dialysis: Principal | ICD-10-CM

## 2017-08-03 DIAGNOSIS — Z794 Long term (current) use of insulin: Principal | ICD-10-CM

## 2017-08-04 ENCOUNTER — Other Ambulatory Visit: Payer: Self-pay | Admitting: *Deleted

## 2017-08-04 NOTE — Patient Outreach (Signed)
[name]) Care Management  08/04/2017  Oscar Castillo 03-17-1951 [id number]   Call placed to member's wife/caregiver to follow up on contact from [place] [employer] for in home assistance.  She state that she has not received a call nor has anyone come to visit for an assessment.  Advised that this care manager will call to follow up on referral.  She denies completing application for CAPS at this time.  State she took the paperwork out to complete and will do it within the next week.  She report that she has continued to have problems managing member's care due to his amputation and his dementia, which she state is a result from his stroke.  State he has continued to deny assistance, wanting her to fully care for him.  This care manager inquired again about assisted living and SCAT services to decrease the strain of getting member in/out of car multiple times a week.  She state he refuses.  She agrees to have home visit within the next 2 weeks, will discuss again with member at that time.  [name], [place], MSN [place] [phone]

## 2017-08-05 ENCOUNTER — Other Ambulatory Visit: Payer: Self-pay | Admitting: Internal Medicine

## 2017-08-05 DIAGNOSIS — N2581 Secondary hyperparathyroidism of renal origin: Secondary | ICD-10-CM | POA: Diagnosis not present

## 2017-08-05 DIAGNOSIS — D631 Anemia in chronic kidney disease: Secondary | ICD-10-CM | POA: Diagnosis not present

## 2017-08-05 DIAGNOSIS — D689 Coagulation defect, unspecified: Secondary | ICD-10-CM | POA: Diagnosis not present

## 2017-08-05 DIAGNOSIS — E1122 Type 2 diabetes mellitus with diabetic chronic kidney disease: Secondary | ICD-10-CM | POA: Diagnosis not present

## 2017-08-05 DIAGNOSIS — D509 Iron deficiency anemia, unspecified: Secondary | ICD-10-CM | POA: Diagnosis not present

## 2017-08-05 DIAGNOSIS — N186 End stage renal disease: Secondary | ICD-10-CM | POA: Diagnosis not present

## 2017-08-07 DIAGNOSIS — E1122 Type 2 diabetes mellitus with diabetic chronic kidney disease: Secondary | ICD-10-CM | POA: Diagnosis not present

## 2017-08-07 DIAGNOSIS — N186 End stage renal disease: Secondary | ICD-10-CM | POA: Diagnosis not present

## 2017-08-07 DIAGNOSIS — D631 Anemia in chronic kidney disease: Secondary | ICD-10-CM | POA: Diagnosis not present

## 2017-08-07 DIAGNOSIS — N2581 Secondary hyperparathyroidism of renal origin: Secondary | ICD-10-CM | POA: Diagnosis not present

## 2017-08-07 DIAGNOSIS — D689 Coagulation defect, unspecified: Secondary | ICD-10-CM | POA: Diagnosis not present

## 2017-08-07 DIAGNOSIS — D509 Iron deficiency anemia, unspecified: Secondary | ICD-10-CM | POA: Diagnosis not present

## 2017-08-10 DIAGNOSIS — D689 Coagulation defect, unspecified: Secondary | ICD-10-CM | POA: Diagnosis not present

## 2017-08-10 DIAGNOSIS — E1122 Type 2 diabetes mellitus with diabetic chronic kidney disease: Secondary | ICD-10-CM | POA: Diagnosis not present

## 2017-08-10 DIAGNOSIS — N186 End stage renal disease: Secondary | ICD-10-CM | POA: Diagnosis not present

## 2017-08-10 DIAGNOSIS — N2581 Secondary hyperparathyroidism of renal origin: Secondary | ICD-10-CM | POA: Diagnosis not present

## 2017-08-10 DIAGNOSIS — D631 Anemia in chronic kidney disease: Secondary | ICD-10-CM | POA: Diagnosis not present

## 2017-08-10 DIAGNOSIS — D509 Iron deficiency anemia, unspecified: Secondary | ICD-10-CM | POA: Diagnosis not present

## 2017-08-11 ENCOUNTER — Encounter: Payer: Self-pay | Admitting: Internal Medicine

## 2017-08-12 ENCOUNTER — Encounter (HOSPITAL_COMMUNITY): Payer: Self-pay | Admitting: Vascular Surgery

## 2017-08-12 DIAGNOSIS — D631 Anemia in chronic kidney disease: Secondary | ICD-10-CM | POA: Diagnosis not present

## 2017-08-12 DIAGNOSIS — E1122 Type 2 diabetes mellitus with diabetic chronic kidney disease: Secondary | ICD-10-CM | POA: Diagnosis not present

## 2017-08-12 DIAGNOSIS — N2581 Secondary hyperparathyroidism of renal origin: Secondary | ICD-10-CM | POA: Diagnosis not present

## 2017-08-12 DIAGNOSIS — D689 Coagulation defect, unspecified: Secondary | ICD-10-CM | POA: Diagnosis not present

## 2017-08-12 DIAGNOSIS — N186 End stage renal disease: Secondary | ICD-10-CM | POA: Diagnosis not present

## 2017-08-12 DIAGNOSIS — D509 Iron deficiency anemia, unspecified: Secondary | ICD-10-CM | POA: Diagnosis not present

## 2017-08-12 NOTE — OR Nursing (Signed)
Addendum created - patient did not come to the PACU, went to the ICU from the OR. Times removed to reflect correct times.

## 2017-08-13 ENCOUNTER — Ambulatory Visit (INDEPENDENT_AMBULATORY_CARE_PROVIDER_SITE_OTHER): Payer: Medicare Other | Admitting: *Deleted

## 2017-08-13 DIAGNOSIS — G459 Transient cerebral ischemic attack, unspecified: Secondary | ICD-10-CM | POA: Diagnosis not present

## 2017-08-13 DIAGNOSIS — Z5181 Encounter for therapeutic drug level monitoring: Secondary | ICD-10-CM

## 2017-08-13 DIAGNOSIS — Z8679 Personal history of other diseases of the circulatory system: Secondary | ICD-10-CM | POA: Diagnosis not present

## 2017-08-13 DIAGNOSIS — I4891 Unspecified atrial fibrillation: Secondary | ICD-10-CM

## 2017-08-13 LAB — POCT INR: INR: 2.5

## 2017-08-13 NOTE — Patient Instructions (Signed)
Description   Continue 1 tablet daily.  Recheck INR in 4 weeks. Call us with any medication changes or concerns # [phone] [facility], # [phone] Main number.

## 2017-08-14 DIAGNOSIS — N2581 Secondary hyperparathyroidism of renal origin: Secondary | ICD-10-CM | POA: Diagnosis not present

## 2017-08-14 DIAGNOSIS — D631 Anemia in chronic kidney disease: Secondary | ICD-10-CM | POA: Diagnosis not present

## 2017-08-14 DIAGNOSIS — D509 Iron deficiency anemia, unspecified: Secondary | ICD-10-CM | POA: Diagnosis not present

## 2017-08-14 DIAGNOSIS — E1122 Type 2 diabetes mellitus with diabetic chronic kidney disease: Secondary | ICD-10-CM | POA: Diagnosis not present

## 2017-08-14 DIAGNOSIS — N186 End stage renal disease: Secondary | ICD-10-CM | POA: Diagnosis not present

## 2017-08-14 DIAGNOSIS — D689 Coagulation defect, unspecified: Secondary | ICD-10-CM | POA: Diagnosis not present

## 2017-08-17 DIAGNOSIS — D689 Coagulation defect, unspecified: Secondary | ICD-10-CM | POA: Diagnosis not present

## 2017-08-17 DIAGNOSIS — N186 End stage renal disease: Secondary | ICD-10-CM | POA: Diagnosis not present

## 2017-08-17 DIAGNOSIS — N2581 Secondary hyperparathyroidism of renal origin: Secondary | ICD-10-CM | POA: Diagnosis not present

## 2017-08-17 DIAGNOSIS — E1122 Type 2 diabetes mellitus with diabetic chronic kidney disease: Secondary | ICD-10-CM | POA: Diagnosis not present

## 2017-08-17 DIAGNOSIS — D509 Iron deficiency anemia, unspecified: Secondary | ICD-10-CM | POA: Diagnosis not present

## 2017-08-17 DIAGNOSIS — D631 Anemia in chronic kidney disease: Secondary | ICD-10-CM | POA: Diagnosis not present

## 2017-08-17 NOTE — Progress Notes (Signed)
    Postoperative Access Visit   History of Present Illness   VEGAS FRITZE is a 67 y.o. year old male who presents for postoperative follow-up for: L axillary exploration (07/21/17).  Unfortunately, the previous axillary vein on venogram was no longer adequate upon exploration.  The patient's wounds are healed.  The patient notes no steal symptoms.  The patient is able to complete their activities of daily living.  The patient's current symptoms are: L leg neuropathic sx.   Physical Examination   Vitals:   08/20/17 1408  BP: 113/81  Pulse: 75  Resp: 16  Temp: 98.3 F (36.8 C)  TempSrc: Oral  SpO2: 98%  Weight: 205 lb (93 kg)  Height: 5\' 10"  (1.778 m)   Body mass index is 29.41 kg/m.  left arm Incision is healed, hand grip intact  Right arm R axillary vein appears to be 4-5 mm in diameter  Medical Decision Making   JENSON BEEDLE is a 67 y.o. year old male who presents s/p left axillary exploration   Based on bed side exam, I would offer the patient a RUA AVG.  Patient is not interested in such at this time, preferring to continued HD via his [facility].  Thank you for allowing [place] to participate in this patient's care.   Adele Barthel, MD, FACS [facility] Office: [phone] Pager: [phone]

## 2017-08-18 ENCOUNTER — Ambulatory Visit: Payer: Self-pay | Admitting: Internal Medicine

## 2017-08-18 ENCOUNTER — Other Ambulatory Visit: Payer: Self-pay | Admitting: *Deleted

## 2017-08-18 ENCOUNTER — Ambulatory Visit: Payer: Self-pay | Admitting: *Deleted

## 2017-08-18 NOTE — Patient Outreach (Signed)
[facility] [facility]) Care Management  08/18/2017  Oscar Castillo October 31, 1950 [id number]   Call received from wife, Oscar Castillo, requesting to reschedule home visit for today.  She state they have a funeral to attend today. She confirms that [place] [employer] have started, aide will come out today at 11 to assist with bathing/dressing.  Home visit rescheduled for next week.  She denies any urgent concerns.    Oscar Castillo, [place], MSN [place] [phone]

## 2017-08-19 DIAGNOSIS — E1122 Type 2 diabetes mellitus with diabetic chronic kidney disease: Secondary | ICD-10-CM | POA: Diagnosis not present

## 2017-08-19 DIAGNOSIS — N2581 Secondary hyperparathyroidism of renal origin: Secondary | ICD-10-CM | POA: Diagnosis not present

## 2017-08-19 DIAGNOSIS — D509 Iron deficiency anemia, unspecified: Secondary | ICD-10-CM | POA: Diagnosis not present

## 2017-08-19 DIAGNOSIS — D689 Coagulation defect, unspecified: Secondary | ICD-10-CM | POA: Diagnosis not present

## 2017-08-19 DIAGNOSIS — Z5181 Encounter for therapeutic drug level monitoring: Secondary | ICD-10-CM | POA: Diagnosis not present

## 2017-08-19 DIAGNOSIS — D631 Anemia in chronic kidney disease: Secondary | ICD-10-CM | POA: Diagnosis not present

## 2017-08-19 DIAGNOSIS — N186 End stage renal disease: Secondary | ICD-10-CM | POA: Diagnosis not present

## 2017-08-19 DIAGNOSIS — I4891 Unspecified atrial fibrillation: Secondary | ICD-10-CM | POA: Diagnosis not present

## 2017-08-20 ENCOUNTER — Encounter: Payer: Self-pay | Admitting: Vascular Surgery

## 2017-08-20 ENCOUNTER — Ambulatory Visit (INDEPENDENT_AMBULATORY_CARE_PROVIDER_SITE_OTHER): Payer: Self-pay | Admitting: Vascular Surgery

## 2017-08-20 ENCOUNTER — Other Ambulatory Visit: Payer: Self-pay

## 2017-08-20 ENCOUNTER — Ambulatory Visit (INDEPENDENT_AMBULATORY_CARE_PROVIDER_SITE_OTHER)
Admission: RE | Admit: 2017-08-20 | Discharge: 2017-08-20 | Disposition: A | Discharge status: Home / Self Care | Payer: Medicare Other | Source: Ambulatory Visit | Attending: Vascular Surgery | Admitting: Vascular Surgery

## 2017-08-20 ENCOUNTER — Ambulatory Visit (HOSPITAL_COMMUNITY)
Admission: RE | Admit: 2017-08-20 | Discharge: 2017-08-20 | Disposition: A | Discharge status: Home / Self Care | Payer: Medicare Other | Source: Ambulatory Visit | Attending: Vascular Surgery | Admitting: Vascular Surgery

## 2017-08-20 VITALS — BP 113/81 | HR 75 | Temp 98.3°F | Resp 16 | Ht 70.0 in | Wt 205.0 lb

## 2017-08-20 DIAGNOSIS — N186 End stage renal disease: Secondary | ICD-10-CM | POA: Diagnosis not present

## 2017-08-20 DIAGNOSIS — E1122 Type 2 diabetes mellitus with diabetic chronic kidney disease: Secondary | ICD-10-CM | POA: Diagnosis not present

## 2017-08-20 DIAGNOSIS — I82711 Chronic embolism and thrombosis of superficial veins of right upper extremity: Secondary | ICD-10-CM | POA: Diagnosis not present

## 2017-08-20 DIAGNOSIS — Z794 Long term (current) use of insulin: Secondary | ICD-10-CM

## 2017-08-20 DIAGNOSIS — Z992 Dependence on renal dialysis: Secondary | ICD-10-CM | POA: Insufficient documentation

## 2017-08-21 DIAGNOSIS — D631 Anemia in chronic kidney disease: Secondary | ICD-10-CM | POA: Diagnosis not present

## 2017-08-21 DIAGNOSIS — D509 Iron deficiency anemia, unspecified: Secondary | ICD-10-CM | POA: Diagnosis not present

## 2017-08-21 DIAGNOSIS — N2581 Secondary hyperparathyroidism of renal origin: Secondary | ICD-10-CM | POA: Diagnosis not present

## 2017-08-21 DIAGNOSIS — D689 Coagulation defect, unspecified: Secondary | ICD-10-CM | POA: Diagnosis not present

## 2017-08-21 DIAGNOSIS — N186 End stage renal disease: Secondary | ICD-10-CM | POA: Diagnosis not present

## 2017-08-21 DIAGNOSIS — E1122 Type 2 diabetes mellitus with diabetic chronic kidney disease: Secondary | ICD-10-CM | POA: Diagnosis not present

## 2017-08-23 ENCOUNTER — Ambulatory Visit (INDEPENDENT_AMBULATORY_CARE_PROVIDER_SITE_OTHER): Payer: Medicare Other

## 2017-08-23 ENCOUNTER — Other Ambulatory Visit: Payer: Self-pay | Admitting: *Deleted

## 2017-08-23 ENCOUNTER — Encounter (INDEPENDENT_AMBULATORY_CARE_PROVIDER_SITE_OTHER): Payer: Self-pay | Admitting: Orthopedic Surgery

## 2017-08-23 ENCOUNTER — Ambulatory Visit (INDEPENDENT_AMBULATORY_CARE_PROVIDER_SITE_OTHER): Payer: Medicare Other | Admitting: Orthopedic Surgery

## 2017-08-23 VITALS — Ht 70.0 in | Wt 205.0 lb

## 2017-08-23 DIAGNOSIS — I739 Peripheral vascular disease, unspecified: Secondary | ICD-10-CM | POA: Diagnosis not present

## 2017-08-23 DIAGNOSIS — L97221 Non-pressure chronic ulcer of left calf limited to breakdown of skin: Secondary | ICD-10-CM

## 2017-08-23 MED ORDER — PENTOXIFYLLINE ER 400 MG PO TBCR: 400.0000 mg | EXTENDED_RELEASE_TABLET | Freq: Three times a day (TID) | ORAL | 3 refills | Status: DC

## 2017-08-23 MED ORDER — DOXYCYCLINE HYCLATE 100 MG PO TABS: 100.0000 mg | ORAL_TABLET | Freq: Two times a day (BID) | ORAL | 0 refills | Status: DC

## 2017-08-23 NOTE — Patient Outreach (Signed)
[facility]) Care Management   08/23/2017  Oscar Castillo 1951-02-02 [id number]  Oscar Castillo is an 66 y.o. male  Subjective:   Member alert and oriented x3, denies complaints of pain at this time.  Wife report compliance with medications, state blood pressure has stabilized.  She report home health PT has stopped, however there has not been much progress with use of prosthetics.  State she is hoping in the future he will be able to have PT approved again.  Objective:   Review of Systems  Constitutional: Negative.   HENT: Negative.   Eyes: Negative.   Respiratory: Negative.   Cardiovascular: Negative.   Gastrointestinal: Negative.   Genitourinary: Negative.   Musculoskeletal: Negative.   Skin: Negative.   Neurological: Negative.   Endo/Heme/Allergies: Negative.   Psychiatric/Behavioral: Negative.     Physical Exam  Constitutional: He is oriented to person, place, and time. He appears well-developed and well-nourished.  Neck: Normal range of motion.  Cardiovascular: Normal rate, regular rhythm and normal heart sounds.  Respiratory: Effort normal and breath sounds normal.  GI: Soft. Bowel sounds are normal.  Musculoskeletal: Normal range of motion.  Neurological: He is alert and oriented to person, place, and time.  Skin: Skin is warm and dry.   BP (!) 132/97   Pulse 81   Resp 20   Wt 205 lb (93 kg)   SpO2 96%   BMI 29.41 kg/m   Encounter Medications:   Outpatient Encounter Medications as of 08/23/2017  Medication Sig Note  . acetaminophen (TYLENOL) 500 MG tablet Take 500 mg by mouth every 6 (six) hours as needed for mild pain.   [name] amiodarone (PACERONE) 200 MG tablet TAKE 1 TABLET BY MOUTH DAILY, START THIS ON 11/06/16   . atorvastatin (LIPITOR) 40 MG tablet TAKE 1 TABLET(40 MG) BY MOUTH DAILY AT 6 PM   . calcium acetate (PHOSLO) 667 MG capsule Take 1,334 mg by mouth 3 (three) times daily with meals.    [name] doxycycline (VIBRA-TABS) 100 MG tablet Take 1  tablet (100 mg total) by mouth 2 (two) times daily.   [name] enoxaparin (LOVENOX) 100 MG/ML injection Inject 1 mL (100 mg total) into the skin daily. 07/14/2017: Starts on 12/22  . gabapentin (NEURONTIN) 300 MG capsule Take 1 capsule (300 mg total) by mouth 3 (three) times daily. 3 times a day when necessary neuropathy pain (Patient taking differently: Take 300 mg by mouth 3 (three) times daily as needed (nerve pain). )   . latanoprost (XALATAN) 0.005 % ophthalmic solution Place 1 drop into both eyes at bedtime.    . midodrine (PROAMATINE) 5 MG tablet TAKE 1 TABLET(5 MG) BY MOUTH TWICE DAILY WITH A MEAL   . oxyCODONE-acetaminophen (PERCOCET/ROXICET) 5-325 MG tablet Take 1 tablet by mouth every 6 (six) hours as needed.   . pantoprazole (PROTONIX) 40 MG tablet Take 1 tablet (40 mg total) by mouth 2 (two) times daily before a meal.   . pentoxifylline (TRENTAL) 400 MG CR tablet Take 1 tablet (400 mg total) by mouth 3 (three) times daily with meals.   [name] QUEtiapine (SEROQUEL) 25 MG tablet Take 0.5 tablets (12.5 mg total) by mouth at bedtime.   . senna (SENOKOT) 8.6 MG TABS tablet Take 1 tablet (8.6 mg total) by mouth daily.   . SENSIPAR 30 MG tablet Take 30 mg by mouth every evening.    . silver sulfADIAZINE (SSD) 1 % cream APPLY EXTERNALLY TO THE AFFECTED AREA DAILY   .  sorbitol 70 % SOLN Take 30 mLs by mouth 2 (two) times daily. (Patient taking differently: Take 30 mLs by mouth 2 (two) times daily as needed for moderate constipation. )   . traMADol (ULTRAM) 50 MG tablet Take 50 mg by mouth every 6 (six) hours as needed for moderate pain.   [name] warfarin (COUMADIN) 2.5 MG tablet Take as directed by Anticoagulation Clinic (Patient taking differently: Take 2.5 mg by mouth once daily at night) 07/16/2017: Last dose was 07/14/2017- per Mrs Ronnald Ramp.   No facility-administered encounter medications on file as of 08/23/2017.     Functional Status:   In your present state of health, do you have any difficulty performing  the following activities: 07/21/2017 06/22/2017  Hearing? N N  Vision? N N  Difficulty concentrating or making decisions? N N  Walking or climbing stairs? Y Y  Comment bilateral AKAs -  Dressing or bathing? Y N  Doing errands, shopping? - Y  Conservation officer, nature and eating ? - N  Using the Toilet? - N  In the past six months, have you accidently leaked urine? - N  Do you have problems with loss of bowel control? - N  Managing your Medications? - N  Managing your Finances? - N  Housekeeping or managing your Housekeeping? - N  Some recent data might be hidden    Fall/Depression Screening:    Fall Risk  06/22/2017 06/14/2017 02/10/2017  Falls in the past year? No No No  Number falls in past yr: - - -  Risk Factor Category  - - -  Risk for fall due to : Impaired mobility Impaired mobility Impaired mobility;Impaired balance/gait   PHQ 2/9 Scores 06/22/2017 06/14/2017 02/10/2017 03/11/2016 10/07/2015 07/10/2015 01/24/2015  PHQ - 2 Score '1 4 3 ' 0 0 0 0  PHQ- 9 Score '3 9 7 ' - - - -    Assessment:    Met with member and wife at scheduled time.  Wife report Home Care providers made initial visit last week for assessment, services will begin this week (twice a week for 17 total visits).  Wife has started paperwork for CAPS application, but has questions on completion.  Advised that this care manager will have LCSW contact with assistance.  Wife voices concern for lifting member in/out car and wheelchair for MD appointments, state today was "bad."  Report member slid to the ground coming from MD appointment today and almost fell getting back in bed.  This care manager discusses using transportation services such as [employer] to provide transportation to visits.  In doing so, wife would only have to life member once to get in wheelchair prior to visit and once to get him back in bed after visit versus multiple times getting in/out of car.  Member agrees, advised that LCSW will also provide assistance with  transportation services.  This care manager inquires about ongoing nursing needs, member/wife deny. She state all ongoing needs are related to providing more support in the home.    Plan:   Will place new referral to LCSW to help with CAPS application as well as transportation assistance.   Will follow up with wife within the next 2 weeks, if no ongoing nursing needs will close to nursing at that time.  THN CM Care Plan Problem One     Most Recent Value  Care Plan Problem One  Risk for ED visit related to hypotension as evidenced by recent ED visit for condition  Role Documenting the Problem One  Care Management Coordinator  Care Plan for Problem One  Not Active  THN Long Term Goal   Member's blood pressure will remain within normal limits over the next 31 days  THN Long Term Goal Start Date  07/12/17  [facility] Long Term Goal Met Date  08/23/17  Interventions for Problem One Long Term Goal  Member and wife educated on importance of monitoring and documenting blood pressure in effort to monitor trends to allow MD to adjust medications as indicated  THN CM Short Term Goal #1   Wife will report medication compliance within the next 4 weeks  THN CM Short Term Goal #1 Start Date  07/12/17  [facility] CM Short Term Goal #1 Met Date  08/23/17  Interventions for Short Term Goal #1  Member & wife educated on the importance of taking medication (specifically Midodrine) in effort to keep blood pressure elevated to adequate reading.    THN CM Care Plan Problem Two     Most Recent Value  Care Plan Problem Two  Risk for hospitalization related to caregiver burnout as evidenced by caregiver's request for help  Role Documenting the Problem Two  Care Management Coordinator  Care Plan for Problem Two  Not Active  THN CM Short Term Goal #1   Member's wife will have additional help in the home within the next 2 weeks  THN CM Short Term Goal #1 Start Date  07/12/17  [facility] CM Short Term Goal #1 Met Date   08/23/17   Interventions for Short Term Goal #2   Member and wife educated on importance of adequate support in the home in order to care for member and decrease risk of complications due to caregiver burnout.  Will place referral to home care providers if approved by supervisor     [name], RN, MSN [place] Manager [phone]

## 2017-08-23 NOTE — Progress Notes (Signed)
Office Visit Note   Patient: Oscar Castillo           Date of Birth: October 14, 1950           MRN: [id number] Visit Date: 08/23/2017              Requested by: [name], MD [place], [place] [address] PCP: [name], MD  Chief Complaint  Patient presents with  . Left Leg - Follow-up    Bilateral below the knee amputation   . Right Leg - Follow-up      HPI: Patient is a 67 year old gentleman who is seen for evaluation for pain to left residual limb. Is s/p bilateral transtibial amputations.  Patient has a knee flexion contracture he just had his sockets modified with [employer].  Patient states that he cannot sleep at night due to ongoing pain in distal L BKA. he has pain even to lightly touch his leg.  Was started on gabapentin at last visit for neuropathic pain, is having continued pain. Some relief with this. Makes him drowsy, only taking bid.  Patient is diabetic with end-stage renal disease on dialysis Tuesday Thursday Saturday. he is on Coumadin.  Wife has been doing daily silvadene dressing changes to left BKA ulcer. Wearing prosthesis for transfers only.   Assessment & Plan: Visit Diagnoses:  1. Non-pressure chronic ulcer of left calf limited to breakdown of skin ([place])     Plan: continue on Neurontin 300 mg. Will try on Trental for ischemic pain. Will also provide a course of oral abx. Continue silvadene dressings to distal L BKA ulcer. Keep out of prosthesis.  Follow-Up Instructions: Return in about 3 weeks (around 09/13/2017) for c duda.   Ortho Exam  Patient is alert, oriented, no adenopathy, well-dressed, normal affect, normal respiratory effort. Examination patient is ambulating in a wheelchair.  He has a short transtibial ectasia in the right and a mid-level amputation on the left.  Patient has a fixed flexion contracture of both knees.  There is no effusion in either knee there is no tenderness to palpation along the joint lines bilaterally.   Patient has no redness no cellulitis no swelling no signs of infection.  The left BKA has distal ulcer from endbearing, is 10 mm in diameter, no depth. Covered with eschar. Tender to touch. No drainage or surrounding erythema. No palpable abscess.  Patient does have hypersensitivity to light touch.  Imaging: Xr Tibia/fibula Left  Result Date: 08/23/2017 Radiographs of left tibia/fibula reassuring. No sign of osteomyelitis in distal tibia.   No images are attached to the encounter.  Labs: Lab Results  Component Value Date   HGBA1C 7.1 (H) 02/10/2017   HGBA1C 6.4 (H) 09/15/2016   HGBA1C 6.2 12/11/2015   ESRSEDRATE 27 (H) 05/09/2013   CRP 6.7 (H) 05/09/2013   REPTSTATUS 07/12/2017 FINAL 07/07/2017   GRAMSTAIN  11/09/2016    FEW WBC PRESENT,BOTH PMN AND MONONUCLEAR NO ORGANISMS SEEN    CULT NO GROWTH 5 DAYS 07/07/2017   LABORGA STAPHYLOCOCCUS AUREUS 11/09/2016    @LABSALLVALUES (HGBA1)@  Body mass index is 29.41 kg/m.  Orders:  Orders Placed This Encounter  Procedures  . XR Tibia/Fibula Left   Meds ordered this encounter  Medications  . doxycycline (VIBRA-TABS) 100 MG tablet    Sig: Take 1 tablet (100 mg total) by mouth 2 (two) times daily.    Dispense:  60 tablet    Refill:  0  . pentoxifylline (TRENTAL) 400  MG CR tablet    Sig: Take 1 tablet (400 mg total) by mouth 3 (three) times daily with meals.    Dispense:  90 tablet    Refill:  3     Procedures: No procedures performed  Clinical Data: No additional findings.  ROS:  All other systems negative, except as noted in the HPI. Review of Systems  Constitutional: Negative for chills and fever.  Cardiovascular: Negative for leg swelling.  Skin: Positive for wound. Negative for color change.    Objective: Vital Signs: Ht 5\' 10"  (1.778 m)   Wt 205 lb (93 kg)   BMI 29.41 kg/m   Specialty Comments:  No specialty comments available.  PMFS History: Patient Active Problem List   Diagnosis Date Noted  .  Non-pressure chronic ulcer of left calf limited to breakdown of skin ([place]) 07/30/2017  . Dysphagia 07/10/2017  . Sacral ulcer ([place]) 05/07/2017  . CHB (complete heart block) ([place]) 04/29/2017  . Heart block AV third degree ([place]) 04/29/2017  . Complete heart block ([place])   . PAD (peripheral artery disease) ([place])   . General weakness 02/10/2017  . Gait disorder 02/10/2017  . MSSA bacteremia   . Sepsis ([place]) 11/08/2016  . Encounter for therapeutic drug monitoring 10/07/2016  . Diabetes mellitus with complication ([place])   . Anemia due to stage 5 chronic kidney disease ([place])   . Hypotension   . Melena 09/26/2016  . Pressure injury of skin 09/26/2016  . Benign neoplasm of descending colon   . Gastroesophageal reflux disease with esophagitis   . Duodenal ulcer without hemorrhage or perforation   . Blood loss anemia 09/15/2016  . Paroxysmal atrial fibrillation ([place]) 09/15/2016  . Heme positive stool 09/15/2016  . RUQ pain 07/12/2016  . Hyperkalemia 07/01/2016  . Anemia 12/31/2015  . Glaucoma 12/27/2015  . LBBB (left bundle branch block)   . Rash 11/15/2015  . Cramping of hands 11/15/2015  . Liver fibrosis 10/07/2015  . Diarrhea 08/14/2014  . Dehydration 10/02/2013  . Fever 10/02/2013  . Altered mental status 10/01/2013  . Encephalopathy, toxic 10/01/2013  . Encephalopathy, metabolic 70/35/0093  . History of stroke 09/28/2013  . FTT (failure to thrive) in adult 09/28/2013  . Acute confusional state 09/28/2013  . Ulcer of sacral region, stage 3 ([place]) 09/26/2013  . S/P BKA (below knee amputation) bilateral ([place]) 09/08/2013  . ESRD on hemodialysis ([place]) 05/09/2013  . TIA (transient ischemic attack) 02/20/2013  . Acute blood loss anemia 10/28/2012  . Acute on chronic combined systolic and diastolic CHF (congestive heart failure) ([place]) 10/28/2012  . Constipation 10/28/2012  . GI bleed 10/27/2012  . Mass in rectum 10/27/2012  . Type 2 diabetes mellitus with chronic kidney disease on  chronic dialysis, with long-term current use of insulin ([place]) 09/08/2012  . LVH (left ventricular hypertrophy)-severe concentric 06/13/2012  . Hyperlipidemia 01/30/2011  . CHOLELITHIASIS 08/01/2010  . BENIGN PROSTATIC HYPERTROPHY 08/01/2010  . History of cardiovascular disorder 08/06/2009  . Depression 03/18/2009  . NEPHROLITHIASIS, HX OF 03/18/2009  . Morbid obesity ([place]) 03/25/2007  . Hypertensive heart disease with CHF (congestive heart failure) ([place]) 03/25/2007  . GERD 03/25/2007  . Chronic hepatitis C without hepatic coma ([place]) 03/25/2007   Past Medical History:  Diagnosis Date  . Anemia   . Antral ulcer 2014   small  . BENIGN PROSTATIC HYPERTROPHY   . Bipolar disorder ([place])    "sometimes" (10/07/2016)  . CHOLELITHIASIS   . Chronic combined systolic and diastolic CHF (congestive heart failure) ([place])   .  Complication of anesthesia    wife states pt had trouble waking up with in Nov., 2014  . CVA (cerebral vascular accident) ([place]) 07/2007   No residual effect  . DEPRESSION   . DIABETES MELLITUS, TYPE II    diet control.  No medication  since November 2015  . ERECTILE DYSFUNCTION   . ESRD on hemodialysis ([place])    ESRD due to DM/HTN. .  HD TTS at [facility] on [place].  [name] GERD   . GI bleed    due to gastritis, discharged 10/02/16/notes 10/07/2016  . Hepatitis C    C - has been treated  . History of Clostridium difficile   . History of kidney stones   . HYPERTENSION   . LBBB (left bundle branch block)   . Morbid obesity ([place])   . PAF (paroxysmal atrial fibrillation) ([place])    a. Dx 12/2015.    Family History  Problem Relation Age of Onset  . Diabetes Mother   . Hypertension Mother   . Heart attack Father   . Hypertension Father   . Coronary artery disease Other     Past Surgical History:  Procedure Laterality Date  . AMPUTATION Left 05/12/2013   Procedure: AMPUTATION RAY;  Surgeon: Newt Minion, MD;  Location: [place];  Service: Orthopedics;  Laterality: Left;   Left Foot 1st Ray Amputation  . AMPUTATION Left 06/09/2013   Procedure: AMPUTATION BELOW KNEE;  Surgeon: Newt Minion, MD;  Location: [place];  Service: Orthopedics;  Laterality: Left;  Left Below Knee Amputation and removal proximal screws IM tibial nail  . AMPUTATION Right 09/08/2013   Procedure: AMPUTATION BELOW KNEE;  Surgeon: Newt Minion, MD;  Location: [place];  Service: Orthopedics;  Laterality: Right;  Right Below Knee Amputation  . AMPUTATION Right 10/11/2013   Procedure: AMPUTATION BELOW KNEE;  Surgeon: Newt Minion, MD;  Location: [place];  Service: Orthopedics;  Laterality: Right;  Right Below Knee Amputation Revision  . AV FISTULA PLACEMENT  06/14/2012   Procedure: ARTERIOVENOUS (AV) FISTULA CREATION;  Surgeon: Angelia Mould, MD;  [facility] OR;  Service: Vascular;  Laterality: Left;  Left basilic vein transposition with fistula.  . AV FISTULA PLACEMENT Left 07/21/2017   Procedure: Attempt INSERTION OF ARTERIOVENOUS (AV) WITH  ARTEGRAFT Left  UPPER ARM;  Surgeon: Conrad Braceville, MD;  Location: [place];  Service: Vascular;  Laterality: Left;  . BASCILIC VEIN TRANSPOSITION Left 02/17/2017   Procedure: LEFT 1ST STAGE BRACHIAL VEIN TRANSPOSITION;  Surgeon: Conrad Real, MD;  Location: [place];  Service: Vascular;  Laterality: Left;  . BASCILIC VEIN TRANSPOSITION Left 04/21/2017   Procedure: LEFT 2ND STAGE BRACHIAL VEIN TRANSPOSITION;  Surgeon: Conrad Abernathy, MD;  Location: [place];  Service: Vascular;  Laterality: Left;  . COLONOSCOPY N/A 10/28/2012   Procedure: COLONOSCOPY;  Surgeon: Jeryl Columbia, MD;  Location[facility] ENDOSCOPY;  Service: Endoscopy;  Laterality: N/A;  . COLONOSCOPY N/A 11/02/2012   Procedure: COLONOSCOPY;  Surgeon: Cleotis Nipper, MD;  Location: [facility] ENDOSCOPY;  Service: Endoscopy;  Laterality: N/A;  . COLONOSCOPY N/A 11/03/2012   Procedure: COLONOSCOPY;  Surgeon: Cleotis Nipper, MD;  Location: [facility] ENDOSCOPY;  Service: Endoscopy;  Laterality: N/A;  . COLONOSCOPY N/A 09/16/2016     Procedure: COLONOSCOPY;  Surgeon: Jerene Bears, MD;  Location: [facility] ENDOSCOPY;  Service: Gastroenterology;  Laterality: N/A;  . ENTEROSCOPY N/A 11/08/2012   Procedure: ENTEROSCOPY;  Surgeon: Wonda Horner, MD;  Location: [facility]  ENDOSCOPY;  Service: Endoscopy;  Laterality: N/A;  . ESOPHAGOGASTRODUODENOSCOPY N/A 11/02/2012   Procedure: ESOPHAGOGASTRODUODENOSCOPY (EGD);  Surgeon: Cleotis Nipper, MD;  Location: [facility] ENDOSCOPY;  Service: Endoscopy;  Laterality: N/A;  . ESOPHAGOGASTRODUODENOSCOPY (EGD) WITH PROPOFOL N/A 09/16/2016   Procedure: ESOPHAGOGASTRODUODENOSCOPY (EGD) WITH PROPOFOL;  Surgeon: Jerene Bears, MD;  Location: [facility] ENDOSCOPY;  Service: Gastroenterology;  Laterality: N/A;  . EXCHANGE OF A DIALYSIS CATHETER Left 11/13/2016   Procedure: EXCHANGE OF A DIALYSIS CATHETER;  Surgeon: Elam Dutch, MD;  Location: [place];  Service: Vascular;  Laterality: Left;  . EYE SURGERY Left    to remove scar tissue  . GIVENS CAPSULE STUDY N/A 11/04/2012   Procedure: GIVENS CAPSULE STUDY;  Surgeon: Cleotis Nipper, MD;  Location: [facility] ENDOSCOPY;  Service: Endoscopy;  Laterality: N/A;  . GIVENS CAPSULE STUDY N/A 09/29/2016   Procedure: GIVENS CAPSULE STUDY;  Surgeon: Manus Gunning, MD;  Location: [place];  Service: Gastroenterology;  Laterality: N/A;  try to keep pt up in bedside chair as much as possible during the study.    [name] HARDWARE REMOVAL Left 06/09/2013   Procedure: HARDWARE REMOVAL;  Surgeon: Newt Minion, MD;  Location: [place];  Service: Orthopedics;  Laterality: Left;  Left Below Knee Amputation  and Removal proximal screws IM tibial nail  . HEMATOMA EVACUATION Left 04/29/2017   Procedure: EVACUATION HEMATOMA LEFT ARM;  Surgeon: Conrad Beckham, MD;  Location: [place];  Service: Vascular;  Laterality: Left;  . INSERTION OF DIALYSIS CATHETER N/A 11/09/2016   Procedure: INSERTION OF TEMPORARY DIALYSIS CATHETER;  Surgeon: Elam Dutch, MD;  Location: [place];  Service: Vascular;  Laterality: N/A;  .  IR GENERIC HISTORICAL  09/27/2016   IR US GUIDE VASC ACCESS RIGHT 09/27/2016 Aletta Edouard, MD [facility]  . IR GENERIC HISTORICAL  09/27/2016   IR FLUORO GUIDE CV LINE RIGHT 09/27/2016 Aletta Edouard, MD [facility]  . LIGATION OF ARTERIOVENOUS  FISTULA Left 11/09/2016   Procedure: LIGATION OF ARTERIOVENOUS  FISTULA;  Surgeon: Elam Dutch, MD;  Location: [place];  Service: Vascular;  Laterality: Left;  . ORIF FIBULA FRACTURE Left 09/09/2012   Procedure: OPEN REDUCTION INTERNAL FIXATION (ORIF) FIBULA FRACTURE;  Surgeon: Johnny Bridge, MD;  Location: [place];  Service: Orthopedics;  Laterality: Left;  . PACEMAKER IMPLANT N/A 04/30/2017   Procedure: PACEMAKER IMPLANT;  Surgeon: Evans Lance, MD;  Location: [place] CV LAB;  Service: Cardiovascular;  Laterality: N/A;  . TEMPORARY PACEMAKER N/A 04/29/2017   Procedure: TEMPORARY PACEMAKER;  Surgeon: Lorretta Harp, MD;  Location: [place] CV LAB;  Service: Cardiovascular;  Laterality: N/A;  . TIBIA IM NAIL INSERTION Left 09/09/2012   Procedure: INTRAMEDULLARY (IM) NAIL TIBIAL;  Surgeon: Johnny Bridge, MD;  Location: [place];  Service: Orthopedics;  Laterality: Left;  left tibial nail and open reduction internal fixation left fibula fracture  . UPPER EXTREMITY VENOGRAPHY N/A 12/14/2016   Procedure: Bilateral Upper Extremity Venography and Central Venography;  Surgeon: Conrad [place], MD;  Location: [place] CV LAB;  Service: Cardiovascular;  Laterality: N/A;   Social History   Occupational History  . Occupation: disabled due to stroke    Employer: RETIRED  Tobacco Use  . Smoking status: Never Smoker  . Smokeless tobacco: Never Used  Substance and Sexual Activity  . Alcohol use: No  . Drug use: No  . Sexual activity: Yes    Birth control/protection: None

## 2017-08-24 DIAGNOSIS — D689 Coagulation defect, unspecified: Secondary | ICD-10-CM | POA: Diagnosis not present

## 2017-08-24 DIAGNOSIS — E1122 Type 2 diabetes mellitus with diabetic chronic kidney disease: Secondary | ICD-10-CM | POA: Diagnosis not present

## 2017-08-24 DIAGNOSIS — N2581 Secondary hyperparathyroidism of renal origin: Secondary | ICD-10-CM | POA: Diagnosis not present

## 2017-08-24 DIAGNOSIS — D631 Anemia in chronic kidney disease: Secondary | ICD-10-CM | POA: Diagnosis not present

## 2017-08-24 DIAGNOSIS — N186 End stage renal disease: Secondary | ICD-10-CM | POA: Diagnosis not present

## 2017-08-24 DIAGNOSIS — D509 Iron deficiency anemia, unspecified: Secondary | ICD-10-CM | POA: Diagnosis not present

## 2017-08-25 ENCOUNTER — Other Ambulatory Visit: Payer: Self-pay | Admitting: Licensed Clinical Social Worker

## 2017-08-25 NOTE — Patient Outreach (Signed)
[facility]) Care Management  08/25/2017  Oscar Castillo 08-16-1950 [id number]  Assessment-CSW completed outreach attempt today after receiving referral for employment assistance resources. CSW unable to reach patient successfully. CSW left a HIPPA compliant voice message encouraging patient to return call once available.  Plan-CSW will await return call or complete an additional outreach if needed.  [name], BSW, MSW, [email or web address] Phone: [phone] Fax: [phone]

## 2017-08-26 ENCOUNTER — Other Ambulatory Visit: Payer: Self-pay | Admitting: Licensed Clinical Social Worker

## 2017-08-26 DIAGNOSIS — D689 Coagulation defect, unspecified: Secondary | ICD-10-CM | POA: Diagnosis not present

## 2017-08-26 DIAGNOSIS — Z992 Dependence on renal dialysis: Secondary | ICD-10-CM | POA: Diagnosis not present

## 2017-08-26 DIAGNOSIS — N2581 Secondary hyperparathyroidism of renal origin: Secondary | ICD-10-CM | POA: Diagnosis not present

## 2017-08-26 DIAGNOSIS — N186 End stage renal disease: Secondary | ICD-10-CM | POA: Diagnosis not present

## 2017-08-26 DIAGNOSIS — D631 Anemia in chronic kidney disease: Secondary | ICD-10-CM | POA: Diagnosis not present

## 2017-08-26 DIAGNOSIS — D509 Iron deficiency anemia, unspecified: Secondary | ICD-10-CM | POA: Diagnosis not present

## 2017-08-26 DIAGNOSIS — E1122 Type 2 diabetes mellitus with diabetic chronic kidney disease: Secondary | ICD-10-CM | POA: Diagnosis not present

## 2017-08-26 NOTE — Patient Outreach (Signed)
[facility]) Care Management  08/26/2017  Oscar Castillo Jul 13, 1951 [id number]  Assessment- CSW completed initial outreach call to patient's spouse after receiving referral for transportation assistance and assistance with completing CAPPS application. CSW was able to reach spouse successfully. CSW introduced self, reason for call and of [facility] social work services. Family agreeable to social work assistance. HIPPA verifications were provided. Spouse reports picking up a SCAT application from the dialysis center. CSW scheduled home visit for tomorrow on 08/27/17 and will assist with completing SCAT application. CSW will also review CAPPS application with family.  [facility] CM Care Plan Problem One     Most Recent Value  Care Plan Problem One  Lack of support within the home and need for community resource support  Role Documenting the Problem One  Clinical Social Worker  Care Plan for Problem One  Active  [facility] CM Short Term Goal #1   Patient will gain stable transportation within 30 days as evidenced by spouse self report  [facility] CM Short Term Goal #1 Start Date  08/26/17  Interventions for Short Term Goal #1  CSW provided family with education on available transportation resources. Family feel that SCAT will be the best option. Spouse picked up SCAT application today at dialysis center and CSW will complete home visit tomorrow and completed application for family, fax to SCAT and monitor entire process closely to ensure stable transportation is successfully gained.     Plan-CSW will complete home visit tomorrow on 08/27/17.  Oscar Castillo, BSW, MSW, [name] Phone: [phone] Fax: [phone]

## 2017-08-27 ENCOUNTER — Ambulatory Visit: Payer: Self-pay | Admitting: Licensed Clinical Social Worker

## 2017-08-27 ENCOUNTER — Other Ambulatory Visit: Payer: Self-pay | Admitting: Licensed Clinical Social Worker

## 2017-08-27 ENCOUNTER — Ambulatory Visit (INDEPENDENT_AMBULATORY_CARE_PROVIDER_SITE_OTHER): Payer: Medicare Other | Admitting: *Deleted

## 2017-08-27 DIAGNOSIS — E1122 Type 2 diabetes mellitus with diabetic chronic kidney disease: Secondary | ICD-10-CM | POA: Diagnosis not present

## 2017-08-27 DIAGNOSIS — Z5181 Encounter for therapeutic drug level monitoring: Secondary | ICD-10-CM

## 2017-08-27 DIAGNOSIS — N186 End stage renal disease: Secondary | ICD-10-CM | POA: Diagnosis not present

## 2017-08-27 DIAGNOSIS — Z8679 Personal history of other diseases of the circulatory system: Secondary | ICD-10-CM | POA: Diagnosis not present

## 2017-08-27 DIAGNOSIS — I4891 Unspecified atrial fibrillation: Secondary | ICD-10-CM

## 2017-08-27 DIAGNOSIS — G459 Transient cerebral ischemic attack, unspecified: Secondary | ICD-10-CM | POA: Diagnosis not present

## 2017-08-27 DIAGNOSIS — Z992 Dependence on renal dialysis: Secondary | ICD-10-CM | POA: Diagnosis not present

## 2017-08-27 LAB — POCT INR: INR: 2.6

## 2017-08-27 NOTE — Patient Outreach (Signed)
[facility]) Care Management  08/27/2017  ALFONSA VAILE 1951-04-13 [id number]  Assessment- CSW received incoming call from patient's spouse. HIPPA verifications provided. Spouse reports that she will need to reschedule today's appointment as her close friend's daughter passed away yesterday. CSW expressed condolences for her loss. CSW rescheduled home visit for 08/31/17.  Plan-CSW will complete home visit next week.  [name], BSW, MSW, [email or web address] Phone: [phone] Fax: [phone]

## 2017-08-27 NOTE — Patient Instructions (Signed)
Description   Continue 1 tablet daily.  Recheck INR in 2 weeks. Call us with any medication changes or concerns # [phone] Coumadin Clinic, # [phone] Main number.

## 2017-08-28 DIAGNOSIS — D631 Anemia in chronic kidney disease: Secondary | ICD-10-CM | POA: Diagnosis not present

## 2017-08-28 DIAGNOSIS — N186 End stage renal disease: Secondary | ICD-10-CM | POA: Diagnosis not present

## 2017-08-28 DIAGNOSIS — Z992 Dependence on renal dialysis: Secondary | ICD-10-CM | POA: Diagnosis not present

## 2017-08-28 DIAGNOSIS — N2581 Secondary hyperparathyroidism of renal origin: Secondary | ICD-10-CM | POA: Diagnosis not present

## 2017-08-28 DIAGNOSIS — E1122 Type 2 diabetes mellitus with diabetic chronic kidney disease: Secondary | ICD-10-CM | POA: Diagnosis not present

## 2017-08-28 DIAGNOSIS — D509 Iron deficiency anemia, unspecified: Secondary | ICD-10-CM | POA: Diagnosis not present

## 2017-08-30 ENCOUNTER — Other Ambulatory Visit: Payer: Self-pay | Admitting: Licensed Clinical Social Worker

## 2017-08-30 NOTE — Patient Outreach (Signed)
[employer] [facility] [facility]) Care Management  08/30/2017  IRFAN VEAL 1951/04/30 [id number]  Assessment- CSW received incoming call from patient's spouse. She reports that family is unable to complete scheduled home visit for tomorrow as she has a funeral to attend that day. CSW rescheduled home visit for 09/03/17.  Plan-CSW will complete home visit this week.  [name], BSW, MSW, [email or web address] Phone: [phone] Fax: [phone]

## 2017-08-31 ENCOUNTER — Ambulatory Visit: Payer: Self-pay | Admitting: Licensed Clinical Social Worker

## 2017-08-31 DIAGNOSIS — N2581 Secondary hyperparathyroidism of renal origin: Secondary | ICD-10-CM | POA: Diagnosis not present

## 2017-08-31 DIAGNOSIS — Z992 Dependence on renal dialysis: Secondary | ICD-10-CM | POA: Diagnosis not present

## 2017-08-31 DIAGNOSIS — D631 Anemia in chronic kidney disease: Secondary | ICD-10-CM | POA: Diagnosis not present

## 2017-08-31 DIAGNOSIS — E1122 Type 2 diabetes mellitus with diabetic chronic kidney disease: Secondary | ICD-10-CM | POA: Diagnosis not present

## 2017-08-31 DIAGNOSIS — D509 Iron deficiency anemia, unspecified: Secondary | ICD-10-CM | POA: Diagnosis not present

## 2017-08-31 DIAGNOSIS — N186 End stage renal disease: Secondary | ICD-10-CM | POA: Diagnosis not present

## 2017-09-01 ENCOUNTER — Encounter: Payer: Self-pay | Admitting: Internal Medicine

## 2017-09-02 DIAGNOSIS — N2581 Secondary hyperparathyroidism of renal origin: Secondary | ICD-10-CM | POA: Diagnosis not present

## 2017-09-02 DIAGNOSIS — E1122 Type 2 diabetes mellitus with diabetic chronic kidney disease: Secondary | ICD-10-CM | POA: Diagnosis not present

## 2017-09-02 DIAGNOSIS — D509 Iron deficiency anemia, unspecified: Secondary | ICD-10-CM | POA: Diagnosis not present

## 2017-09-02 DIAGNOSIS — D631 Anemia in chronic kidney disease: Secondary | ICD-10-CM | POA: Diagnosis not present

## 2017-09-02 DIAGNOSIS — Z992 Dependence on renal dialysis: Secondary | ICD-10-CM | POA: Diagnosis not present

## 2017-09-02 DIAGNOSIS — N186 End stage renal disease: Secondary | ICD-10-CM | POA: Diagnosis not present

## 2017-09-03 ENCOUNTER — Other Ambulatory Visit: Payer: Self-pay | Admitting: Licensed Clinical Social Worker

## 2017-09-03 NOTE — Patient Outreach (Signed)
[facility]) Care Management  [facility] Social Work  09/03/2017  Oscar Castillo 04/05/1951 [id number]  Encounter Medications:  Outpatient Encounter Medications as of 09/03/2017  Medication Sig Note  . acetaminophen (TYLENOL) 500 MG tablet Take 500 mg by mouth every 6 (six) hours as needed for mild pain.   [name] amiodarone (PACERONE) 200 MG tablet TAKE 1 TABLET BY MOUTH DAILY, START THIS ON 11/06/16   . atorvastatin (LIPITOR) 40 MG tablet TAKE 1 TABLET(40 MG) BY MOUTH DAILY AT 6 PM   . calcium acetate (PHOSLO) 667 MG capsule Take 1,334 mg by mouth 3 (three) times daily with meals.    [name] doxycycline (VIBRA-TABS) 100 MG tablet Take 1 tablet (100 mg total) by mouth 2 (two) times daily.   [name] enoxaparin (LOVENOX) 100 MG/ML injection Inject 1 mL (100 mg total) into the skin daily. 07/14/2017: Starts on 12/22  . gabapentin (NEURONTIN) 300 MG capsule Take 1 capsule (300 mg total) by mouth 3 (three) times daily. 3 times a day when necessary neuropathy pain (Patient taking differently: Take 300 mg by mouth 3 (three) times daily as needed (nerve pain). )   . latanoprost (XALATAN) 0.005 % ophthalmic solution Place 1 drop into both eyes at bedtime.    . midodrine (PROAMATINE) 5 MG tablet TAKE 1 TABLET(5 MG) BY MOUTH TWICE DAILY WITH A MEAL   . oxyCODONE-acetaminophen (PERCOCET/ROXICET) 5-325 MG tablet Take 1 tablet by mouth every 6 (six) hours as needed.   . pantoprazole (PROTONIX) 40 MG tablet Take 1 tablet (40 mg total) by mouth 2 (two) times daily before a meal.   . pentoxifylline (TRENTAL) 400 MG CR tablet Take 1 tablet (400 mg total) by mouth 3 (three) times daily with meals.   [name] QUEtiapine (SEROQUEL) 25 MG tablet Take 0.5 tablets (12.5 mg total) by mouth at bedtime.   . senna (SENOKOT) 8.6 MG TABS tablet Take 1 tablet (8.6 mg total) by mouth daily.   . SENSIPAR 30 MG tablet Take 30 mg by mouth every evening.    . silver sulfADIAZINE (SSD) 1 % cream APPLY EXTERNALLY TO THE AFFECTED AREA DAILY   .  sorbitol 70 % SOLN Take 30 mLs by mouth 2 (two) times daily. (Patient taking differently: Take 30 mLs by mouth 2 (two) times daily as needed for moderate constipation. )   . traMADol (ULTRAM) 50 MG tablet Take 50 mg by mouth every 6 (six) hours as needed for moderate pain.   [name] warfarin (COUMADIN) 2.5 MG tablet Take as directed by Anticoagulation Clinic (Patient taking differently: Take 2.5 mg by mouth once daily at night) 07/16/2017: Last dose was 07/14/2017- per Mrs Ronnald Ramp.   No facility-administered encounter medications on file as of 09/03/2017.     Functional Status:  In your present state of health, do you have any difficulty performing the following activities: 07/21/2017 06/22/2017  Hearing? N N  Vision? N N  Difficulty concentrating or making decisions? N N  Walking or climbing stairs? Y Y  Comment bilateral AKAs -  Dressing or bathing? Y N  Doing errands, shopping? - Y  Conservation officer, nature and eating ? - N  Using the Toilet? - N  In the past six months, have you accidently leaked urine? - N  Do you have problems with loss of bowel control? - N  Managing your Medications? - N  Managing your Finances? - N  Housekeeping or managing your Housekeeping? - N  Some recent data might be hidden  Fall/Depression Screening:  PHQ 2/9 Scores 09/03/2017 06/22/2017 06/14/2017 02/10/2017 03/11/2016 10/07/2015 07/10/2015  PHQ - 2 Score 1 1 4 3  0 0 0  PHQ- 9 Score - 3 9 7  - - -    Assessment: CSW completed initial home visit with patient and spouse on 09/03/17 at their residence. Patient is in need of transportation assistance and personal care resources. CSW provided information on eligibility criteria for CAPPS and PCS through Medicaid. Family's income is way over the Medicaid cut off and family understand that they still need some support in place as spouse has been having more difficulty taking care of patient. Spouse feels that gaining stable transportation will assist her the most. CSW provided  education on available day programs, senior centers, PACE, private pay caregivers and In [place]. Family is agreeable to In [facility] referral and understand that this is a program through [place] that is for families that do not qualify for Medicaid. However, wait list is up to 1 year and 6 months at this time. CSW will complete referral to program. CSW will mail In [place] information to patient's residence to keep and review. Family understands that they can contact program anytime to check on wait list. Family decline wanting any other programs as they are not willing to pay for services at this time. Family understand that they may need to eventually consider hiring an aide at an affordable rate once Home Care Providers ends. CSW provided handout on transportation resources and family wishes to gain SCAT. CSW completed entire SCAT application and will fax it to SCAT office by the end of the day. Family deny wanting mental health resources or deny wanting to complete advance directives at this time. CSW will follow up with family in two weeks to make sure SCAT appointment was scheduled. CSW will fax SCAT application as well.   CSW completed call to In [facility] but was unable to reach anyone in order to place referral to In [place]. CSW will make another attempt next week.  [facility] CM Care Plan Problem One     Most Recent Value  Care Plan Problem One  Lack of support within the home and need for community resource support  Role Documenting the Problem One  Clinical Social Worker  Care Plan for Problem One  Active  [facility] CM Short Term Goal #1   Patient will gain stable transportation within 30 days as evidenced by spouse self report  [facility] CM Short Term Goal #1 Start Date  08/26/17  Interventions for Short Term Goal #1  SCAT application completed and faxed successfully on 09/03/17  [facility] CM Short Term Goal #2   Patient will successfully be placed on the wait list  for In Home Aide Services due to need for more support in the home within 30 days per program confirmation.  [facility] CM Short Term Goal #2 Start Date  09/03/17  Interventions for Short Term Goal #2  CSW will mail family information on program and educated family in person on their services. CSW attempted to make referral to program on 09/03/17 but was unsuccessful in reaching DSS. CSW will continue to make attempts.      Plan: CSW will request that In [place] resources be mailed to family. CSW will route encounter to PCP. CSW will follow up with family in two weeks.  [name], BSW, MSW, [email or web address] Phone: [phone] Fax: [phone]

## 2017-09-03 NOTE — Patient Outreach (Signed)
Request received from [name], LCSW to mail patient personal care resources.  Information mailed today.  Thank you.

## 2017-09-04 DIAGNOSIS — E1122 Type 2 diabetes mellitus with diabetic chronic kidney disease: Secondary | ICD-10-CM | POA: Diagnosis not present

## 2017-09-04 DIAGNOSIS — D631 Anemia in chronic kidney disease: Secondary | ICD-10-CM | POA: Diagnosis not present

## 2017-09-04 DIAGNOSIS — N2581 Secondary hyperparathyroidism of renal origin: Secondary | ICD-10-CM | POA: Diagnosis not present

## 2017-09-04 DIAGNOSIS — D509 Iron deficiency anemia, unspecified: Secondary | ICD-10-CM | POA: Diagnosis not present

## 2017-09-04 DIAGNOSIS — N186 End stage renal disease: Secondary | ICD-10-CM | POA: Diagnosis not present

## 2017-09-04 DIAGNOSIS — Z992 Dependence on renal dialysis: Secondary | ICD-10-CM | POA: Diagnosis not present

## 2017-09-07 ENCOUNTER — Encounter: Payer: Self-pay | Admitting: *Deleted

## 2017-09-07 ENCOUNTER — Encounter: Payer: Self-pay | Admitting: Internal Medicine

## 2017-09-07 ENCOUNTER — Ambulatory Visit (INDEPENDENT_AMBULATORY_CARE_PROVIDER_SITE_OTHER): Payer: Medicare Other

## 2017-09-07 ENCOUNTER — Ambulatory Visit (INDEPENDENT_AMBULATORY_CARE_PROVIDER_SITE_OTHER): Payer: Medicare Other | Admitting: Internal Medicine

## 2017-09-07 VITALS — BP 128/90 | HR 83 | Ht 70.0 in

## 2017-09-07 DIAGNOSIS — I4891 Unspecified atrial fibrillation: Secondary | ICD-10-CM

## 2017-09-07 DIAGNOSIS — G8929 Other chronic pain: Secondary | ICD-10-CM | POA: Insufficient documentation

## 2017-09-07 DIAGNOSIS — D631 Anemia in chronic kidney disease: Secondary | ICD-10-CM | POA: Diagnosis not present

## 2017-09-07 DIAGNOSIS — Z950 Presence of cardiac pacemaker: Secondary | ICD-10-CM | POA: Diagnosis not present

## 2017-09-07 DIAGNOSIS — D509 Iron deficiency anemia, unspecified: Secondary | ICD-10-CM | POA: Diagnosis not present

## 2017-09-07 DIAGNOSIS — E1122 Type 2 diabetes mellitus with diabetic chronic kidney disease: Secondary | ICD-10-CM | POA: Diagnosis not present

## 2017-09-07 DIAGNOSIS — Z992 Dependence on renal dialysis: Secondary | ICD-10-CM | POA: Diagnosis not present

## 2017-09-07 DIAGNOSIS — N186 End stage renal disease: Secondary | ICD-10-CM | POA: Diagnosis not present

## 2017-09-07 DIAGNOSIS — Z5181 Encounter for therapeutic drug level monitoring: Secondary | ICD-10-CM

## 2017-09-07 DIAGNOSIS — I639 Cerebral infarction, unspecified: Secondary | ICD-10-CM

## 2017-09-07 DIAGNOSIS — I442 Atrioventricular block, complete: Secondary | ICD-10-CM | POA: Diagnosis not present

## 2017-09-07 DIAGNOSIS — N2581 Secondary hyperparathyroidism of renal origin: Secondary | ICD-10-CM | POA: Diagnosis not present

## 2017-09-07 DIAGNOSIS — Z8679 Personal history of other diseases of the circulatory system: Secondary | ICD-10-CM

## 2017-09-07 LAB — POCT INR: INR: 3

## 2017-09-07 NOTE — Patient Instructions (Signed)
Description   Continue on same dosage 1 tablet daily.  Recheck INR in 3 weeks. Call us with any medication changes or concerns # [phone] Coumadin Clinic, # [phone] Main number.

## 2017-09-07 NOTE — Progress Notes (Signed)
HPI Oscar Castillo returns today for followup. He is a pleasant 68 yo man with ESRD on HD, chronc systolic and diastolic heart failure, HTN, DM, PAF, CHADSVASC =7, and h/o GI bleeding. In the interim, the patient has been stable. He undergoes T/H/S HD sessions and overall feels well. No syncope. He is s/p bilateral BKAs. He is dialyzing with an indwelling catheter in the left subclavian vein with his PPM on the right side. Allergies  Allergen Reactions  . Ceftriaxone Sodium In Dextrose Anaphylaxis  . Ciprofloxacin Other (See Comments)    UNSPECIFIED PAIN  . Nsaids Other (See Comments)    HISTORY OF ULCER  . Penicillins Swelling    SWELLING REACTION UNSPECIFIED  [ PATIENT WITH HX OF ANAPHYLAXIS TO CEFTRIAXONE ]  . Sulfa Antibiotics Swelling    SWELLING REACTION UNSPECIFIED      Current Outpatient Medications  Medication Sig Dispense Refill  . acetaminophen (TYLENOL) 500 MG tablet Take 500 mg by mouth every 6 (six) hours as needed for mild pain.    [name] amiodarone (PACERONE) 200 MG tablet TAKE 1 TABLET BY MOUTH DAILY, START THIS ON 11/06/16 90 tablet 3  . atorvastatin (LIPITOR) 40 MG tablet TAKE 1 TABLET(40 MG) BY MOUTH DAILY AT 6 PM 30 tablet 2  . calcium acetate (PHOSLO) 667 MG capsule Take 1,334 mg by mouth 3 (three) times daily with meals.     [name] doxycycline (VIBRA-TABS) 100 MG tablet Take 1 tablet (100 mg total) by mouth 2 (two) times daily. 60 tablet 0  . gabapentin (NEURONTIN) 300 MG capsule Take 1 capsule (300 mg total) by mouth 3 (three) times daily. 3 times a day when necessary neuropathy pain (Patient taking differently: Take 300 mg by mouth 3 (three) times daily as needed (nerve pain). ) 90 capsule 3  . latanoprost (XALATAN) 0.005 % ophthalmic solution Place 1 drop into both eyes at bedtime.     . midodrine (PROAMATINE) 5 MG tablet TAKE 1 TABLET(5 MG) BY MOUTH TWICE DAILY WITH A MEAL 180 tablet 0  . oxyCODONE-acetaminophen (PERCOCET/ROXICET) 5-325 MG tablet Take 1 tablet by mouth  every 6 (six) hours as needed. 6 tablet 0  . pantoprazole (PROTONIX) 40 MG tablet Take 1 tablet (40 mg total) by mouth 2 (two) times daily before a meal. 60 tablet 11  . pentoxifylline (TRENTAL) 400 MG CR tablet Take 1 tablet (400 mg total) by mouth 3 (three) times daily with meals. 90 tablet 3  . QUEtiapine (SEROQUEL) 25 MG tablet Take 0.5 tablets (12.5 mg total) by mouth at bedtime. 60 tablet 8  . senna (SENOKOT) 8.6 MG TABS tablet Take 1 tablet (8.6 mg total) by mouth daily. 120 each 0  . SENSIPAR 30 MG tablet Take 30 mg by mouth every evening.     . silver sulfADIAZINE (SSD) 1 % cream APPLY EXTERNALLY TO THE AFFECTED AREA DAILY 50 g 0  . sorbitol 70 % SOLN Take 30 mLs by mouth 2 (two) times daily. (Patient taking differently: Take 30 mLs by mouth 2 (two) times daily as needed for moderate constipation. ) 473 mL 0  . traMADol (ULTRAM) 50 MG tablet Take 50 mg by mouth every 6 (six) hours as needed for moderate pain.    [name] warfarin (COUMADIN) 2.5 MG tablet Take as directed by Anticoagulation Clinic (Patient taking differently: Take 2.5 mg by mouth once daily at night) 40 tablet 3   No current facility-administered medications for this visit.      Past  Medical History:  Diagnosis Date  . Anemia   . Antral ulcer 2014   small  . BENIGN PROSTATIC HYPERTROPHY   . Bipolar disorder ([place])    "sometimes" (10/07/2016)  . CHOLELITHIASIS   . Chronic combined systolic and diastolic CHF (congestive heart failure) ([place])   . Complication of anesthesia    wife states pt had trouble waking up with in Nov., 2014  . CVA (cerebral vascular accident) ([place]) 07/2007   No residual effect  . DEPRESSION   . DIABETES MELLITUS, TYPE II    diet control.  No medication  since November 2015  . ERECTILE DYSFUNCTION   . ESRD on hemodialysis ([place])    ESRD due to DM/HTN. .  HD TTS at [facility] on [place].  [name] GERD   . GI bleed    due to gastritis, discharged 10/02/16/notes 10/07/2016  . Hepatitis C    C - has  been treated  . History of Clostridium difficile   . History of kidney stones   . HYPERTENSION   . LBBB (left bundle branch block)   . Morbid obesity ([place])   . PAF (paroxysmal atrial fibrillation) ([place])    a. Dx 12/2015.    ROS:   All systems reviewed and negative except as noted in the HPI.   Past Surgical History:  Procedure Laterality Date  . AMPUTATION Left 05/12/2013   Procedure: AMPUTATION RAY;  Surgeon: Newt Minion, MD;  Location: [place];  Service: Orthopedics;  Laterality: Left;  Left Foot 1st Ray Amputation  . AMPUTATION Left 06/09/2013   Procedure: AMPUTATION BELOW KNEE;  Surgeon: Newt Minion, MD;  Location: [place];  Service: Orthopedics;  Laterality: Left;  Left Below Knee Amputation and removal proximal screws IM tibial nail  . AMPUTATION Right 09/08/2013   Procedure: AMPUTATION BELOW KNEE;  Surgeon: Newt Minion, MD;  Location: [place];  Service: Orthopedics;  Laterality: Right;  Right Below Knee Amputation  . AMPUTATION Right 10/11/2013   Procedure: AMPUTATION BELOW KNEE;  Surgeon: Newt Minion, MD;  Location: [place];  Service: Orthopedics;  Laterality: Right;  Right Below Knee Amputation Revision  . AV FISTULA PLACEMENT  06/14/2012   Procedure: ARTERIOVENOUS (AV) FISTULA CREATION;  Surgeon: Angelia Mould, MD;  [facility];  Service: Vascular;  Laterality: Left;  Left basilic vein transposition with fistula.  . AV FISTULA PLACEMENT Left 07/21/2017   Procedure: Attempt INSERTION OF ARTERIOVENOUS (AV) WITH  ARTEGRAFT Left  UPPER ARM;  Surgeon: Conrad Midvale, MD;  Location: [place];  Service: Vascular;  Laterality: Left;  . BASCILIC VEIN TRANSPOSITION Left 02/17/2017   Procedure: LEFT 1ST STAGE BRACHIAL VEIN TRANSPOSITION;  Surgeon: Conrad [place]ville, MD;  Location: [place];  Service: Vascular;  Laterality: Left;  . BASCILIC VEIN TRANSPOSITION Left 04/21/2017   Procedure: LEFT 2ND STAGE BRACHIAL VEIN TRANSPOSITION;  Surgeon: Conrad Molalla, MD;  Location: [place];  Service:  Vascular;  Laterality: Left;  . COLONOSCOPY N/A 10/28/2012   Procedure: COLONOSCOPY;  Surgeon: Jeryl Columbia, MD;  Location: [facility] ENDOSCOPY;  Service: Endoscopy;  Laterality: N/A;  . COLONOSCOPY N/A 11/02/2012   Procedure: COLONOSCOPY;  Surgeon: Cleotis Nipper, MD;  Location: [facility] ENDOSCOPY;  Service: Endoscopy;  Laterality: N/A;  . COLONOSCOPY N/A 11/03/2012   Procedure: COLONOSCOPY;  Surgeon: Cleotis Nipper, MD;  Location: [facility] ENDOSCOPY;  Service: Endoscopy;  Laterality: N/A;  . COLONOSCOPY N/A 09/16/2016   Procedure: COLONOSCOPY;  Surgeon: Jerene Bears, MD;  Location: [facility] ENDOSCOPY;  Service: Gastroenterology;  Laterality: N/A;  . ENTEROSCOPY N/A 11/08/2012   Procedure: ENTEROSCOPY;  Surgeon: Wonda Horner, MD;  Location: [facility] ENDOSCOPY;  Service: Endoscopy;  Laterality: N/A;  . ESOPHAGOGASTRODUODENOSCOPY N/A 11/02/2012   Procedure: ESOPHAGOGASTRODUODENOSCOPY (EGD);  Surgeon: Cleotis Nipper, MD;  Location: [facility] ENDOSCOPY;  Service: Endoscopy;  Laterality: N/A;  . ESOPHAGOGASTRODUODENOSCOPY (EGD) WITH PROPOFOL N/A 09/16/2016   Procedure: ESOPHAGOGASTRODUODENOSCOPY (EGD) WITH PROPOFOL;  Surgeon: Jerene Bears, MD;  Location: [facility] ENDOSCOPY;  Service: Gastroenterology;  Laterality: N/A;  . EXCHANGE OF A DIALYSIS CATHETER Left 11/13/2016   Procedure: EXCHANGE OF A DIALYSIS CATHETER;  Surgeon: Elam Dutch, MD;  Location: [place];  Service: Vascular;  Laterality: Left;  . EYE SURGERY Left    to remove scar tissue  . GIVENS CAPSULE STUDY N/A 11/04/2012   Procedure: GIVENS CAPSULE STUDY;  Surgeon: Cleotis Nipper, MD;  Location: [facility] ENDOSCOPY;  Service: Endoscopy;  Laterality: N/A;  . GIVENS CAPSULE STUDY N/A 09/29/2016   Procedure: GIVENS CAPSULE STUDY;  Surgeon: Manus Gunning, MD;  Location: [place];  Service: Gastroenterology;  Laterality: N/A;  try to keep pt up in bedside chair as much as possible during the study.    [name] HARDWARE REMOVAL Left 06/09/2013   Procedure: HARDWARE REMOVAL;  Surgeon:  Newt Minion, MD;  Location: [place];  Service: Orthopedics;  Laterality: Left;  Left Below Knee Amputation  and Removal proximal screws IM tibial nail  . HEMATOMA EVACUATION Left 04/29/2017   Procedure: EVACUATION HEMATOMA LEFT ARM;  Surgeon: Conrad Oglala, MD;  Location: [place];  Service: Vascular;  Laterality: Left;  . INSERTION OF DIALYSIS CATHETER N/A 11/09/2016   Procedure: INSERTION OF TEMPORARY DIALYSIS CATHETER;  Surgeon: Elam Dutch, MD;  Location: [place];  Service: Vascular;  Laterality: N/A;  . IR GENERIC HISTORICAL  09/27/2016   IR US GUIDE VASC ACCESS RIGHT 09/27/2016 Aletta Edouard, MD [facility]-INTER[facility]  . IR GENERIC HISTORICAL  09/27/2016   IR FLUORO GUIDE CV LINE RIGHT 09/27/2016 Aletta Edouard, MD [facility]  . LIGATION OF ARTERIOVENOUS  FISTULA Left 11/09/2016   Procedure: LIGATION OF ARTERIOVENOUS  FISTULA;  Surgeon: Elam Dutch, MD;  Location: [place];  Service: Vascular;  Laterality: Left;  . ORIF FIBULA FRACTURE Left 09/09/2012   Procedure: OPEN REDUCTION INTERNAL FIXATION (ORIF) FIBULA FRACTURE;  Surgeon: Johnny Bridge, MD;  Location: [place];  Service: Orthopedics;  Laterality: Left;  . PACEMAKER IMPLANT N/A 04/30/2017   Procedure: PACEMAKER IMPLANT;  Surgeon: Evans Lance, MD;  Location: [place] CV LAB;  Service: Cardiovascular;  Laterality: N/A;  . TEMPORARY PACEMAKER N/A 04/29/2017   Procedure: TEMPORARY PACEMAKER;  Surgeon: Lorretta Harp, MD;  Location: [place] CV LAB;  Service: Cardiovascular;  Laterality: N/A;  . TIBIA IM NAIL INSERTION Left 09/09/2012   Procedure: INTRAMEDULLARY (IM) NAIL TIBIAL;  Surgeon: Johnny Bridge, MD;  Location: [place];  Service: Orthopedics;  Laterality: Left;  left tibial nail and open reduction internal fixation left fibula fracture  . UPPER EXTREMITY VENOGRAPHY N/A 12/14/2016   Procedure: Bilateral Upper Extremity Venography and Central Venography;  Surgeon: Conrad Quechee, MD;  Location: [place] CV LAB;  Service: Cardiovascular;   Laterality: N/A;     Family History  Problem Relation Age of Onset  . Diabetes Mother   . Hypertension Mother   . Heart attack Father   . Hypertension Father   . Coronary artery disease Other  Social History   Socioeconomic History  . Marital status: Married    Spouse name: Not on file  . Number of children: 2  . Years of education: 42  . Highest education level: Not on file  Social Needs  . Financial resource strain: Not on file  . Food insecurity - worry: Not on file  . Food insecurity - inability: Not on file  . Transportation needs - medical: Not on file  . Transportation needs - non-medical: Not on file  Occupational History  . Occupation: disabled due to stroke    Employer: RETIRED  Tobacco Use  . Smoking status: Never Smoker  . Smokeless tobacco: Never Used  Substance and Sexual Activity  . Alcohol use: No  . Drug use: No  . Sexual activity: Yes    Birth control/protection: None  Other Topics Concern  . 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   No local family, lives alone     BP 128/90   Pulse 83   Ht 5\' 10"  (1.778 m)   BMI 29.41 kg/m   Physical Exam:  Chronically ill appearing NAD HEENT: Unremarkable Neck:  7 cm JVD, no thyromegally Lymphatics:  No adenopathy Back:  No CVA tenderness Lungs:  Clear with no wheezes HEART:  Regular rate rhythm, no murmurs, no rubs, no clicks Abd:  soft, positive bowel sounds, no organomegally, no rebound, no guarding Ext:  2 plus pulses, no edema, no cyanosis, no clubbing Skin:  No rashes no nodules Neuro:  CN II through XII intact, motor grossly intact  EKG - nsr with LBBB and first degree AV block  DEVICE  Normal device function.  See PaceArt for details.   Assess/Plan: 1. Syncope due to [employer] attacks - he is now asymptomatic after PPM insertion. 2. PPM - his medtronic DDD PM is working normally. Will recheck in several months. 3. HTN - his  blood pressure is controlled today. No change in his meds.  Oscar Castillo.D.

## 2017-09-07 NOTE — Patient Instructions (Signed)
Medication Instructions:  Your physician recommends that you continue on your current medications as directed. Please refer to the Current Medication list given to you today.  Labwork: None ordered.  Testing/Procedures: None ordered.  Follow-Up: Your physician wants you to follow-up in: 9 months with Dr. Lovena Le.   You will receive a reminder letter in the mail two months in advance. If you don't receive a letter, please call our office to schedule the follow-up appointment.  Remote monitoring is used to monitor your Pacemaker from home. This monitoring reduces the number of office visits required to check your device to one time per year. It allows [place] to keep an eye on the functioning of your device to ensure it is working properly. You are scheduled for a device check from home on 12/07/2017. You may send your transmission at any time that day. If you have a wireless device, the transmission will be sent automatically. After your physician reviews your transmission, you will receive a postcard with your next transmission date.  Any Other Special Instructions Will Be Listed Below (If Applicable).  If you need a refill on your cardiac medications before your next appointment, please call your pharmacy.

## 2017-09-08 ENCOUNTER — Other Ambulatory Visit: Payer: Self-pay | Admitting: Internal Medicine

## 2017-09-08 LAB — CUP PACEART INCLINIC DEVICE CHECK
Battery Remaining Longevity: 181 mo
Brady Statistic AP VP Percent: 0 %
Brady Statistic AP VS Percent: 0.02 %
Brady Statistic AS VS Percent: 99.48 %
Implantable Lead Implant Date: 20181005
Implantable Lead Model: 5076
Lead Channel Impedance Value: 342 Ohm
Lead Channel Impedance Value: 456 Ohm
Lead Channel Pacing Threshold Amplitude: 0.75 V
Lead Channel Sensing Intrinsic Amplitude: 12.75 mV
Lead Channel Setting Pacing Amplitude: 1.5 V
Lead Channel Setting Pacing Amplitude: 2.5 V
Lead Channel Setting Pacing Pulse Width: 0.4 ms
MDC IDC LEAD IMPLANT DT: 20181005
MDC IDC LEAD LOCATION: 753859
MDC IDC LEAD LOCATION: 753860
MDC IDC MSMT BATTERY VOLTAGE: 3.2 V
MDC IDC MSMT LEADCHNL RA PACING THRESHOLD AMPLITUDE: 0.75 V
MDC IDC MSMT LEADCHNL RA PACING THRESHOLD PULSEWIDTH: 0.4 ms
MDC IDC MSMT LEADCHNL RA SENSING INTR AMPL: 3.75 mV
MDC IDC MSMT LEADCHNL RV IMPEDANCE VALUE: 418 Ohm
MDC IDC MSMT LEADCHNL RV IMPEDANCE VALUE: 532 Ohm
MDC IDC MSMT LEADCHNL RV PACING THRESHOLD PULSEWIDTH: 0.4 ms
MDC IDC PG IMPLANT DT: 20181005
MDC IDC SESS DTM: 20190212213051
MDC IDC SET LEADCHNL RV SENSING SENSITIVITY: 2 mV
MDC IDC STAT BRADY AS VP PERCENT: 0.5 %
MDC IDC STAT BRADY RA PERCENT PACED: 0.02 %
MDC IDC STAT BRADY RV PERCENT PACED: 0.5 %

## 2017-09-09 DIAGNOSIS — N2581 Secondary hyperparathyroidism of renal origin: Secondary | ICD-10-CM | POA: Diagnosis not present

## 2017-09-09 DIAGNOSIS — N186 End stage renal disease: Secondary | ICD-10-CM | POA: Diagnosis not present

## 2017-09-09 DIAGNOSIS — D509 Iron deficiency anemia, unspecified: Secondary | ICD-10-CM | POA: Diagnosis not present

## 2017-09-09 DIAGNOSIS — E1122 Type 2 diabetes mellitus with diabetic chronic kidney disease: Secondary | ICD-10-CM | POA: Diagnosis not present

## 2017-09-09 DIAGNOSIS — D631 Anemia in chronic kidney disease: Secondary | ICD-10-CM | POA: Diagnosis not present

## 2017-09-09 DIAGNOSIS — Z992 Dependence on renal dialysis: Secondary | ICD-10-CM | POA: Diagnosis not present

## 2017-09-10 ENCOUNTER — Encounter: Payer: Self-pay | Admitting: *Deleted

## 2017-09-10 ENCOUNTER — Other Ambulatory Visit: Payer: Self-pay | Admitting: *Deleted

## 2017-09-10 NOTE — Patient Outreach (Signed)
[facility]) Care Management  09/10/2017  JAMARIEN RODKEY 04/28/1951 [id number]   Call placed to member's wife to follow up on contact with LCSW.  She state home visit was complete and they are currently working to have additional support in the home as well as transportation.  She denies any further nursing assistance as support is now her main concern.  This care manager will perform program closure for nursing and will notify primary MD of discipline closure.  Will provide update to LCSW.  [name], [place], MSN [place] [phone]

## 2017-09-11 DIAGNOSIS — D631 Anemia in chronic kidney disease: Secondary | ICD-10-CM | POA: Diagnosis not present

## 2017-09-11 DIAGNOSIS — D509 Iron deficiency anemia, unspecified: Secondary | ICD-10-CM | POA: Diagnosis not present

## 2017-09-11 DIAGNOSIS — N2581 Secondary hyperparathyroidism of renal origin: Secondary | ICD-10-CM | POA: Diagnosis not present

## 2017-09-11 DIAGNOSIS — E1122 Type 2 diabetes mellitus with diabetic chronic kidney disease: Secondary | ICD-10-CM | POA: Diagnosis not present

## 2017-09-11 DIAGNOSIS — N186 End stage renal disease: Secondary | ICD-10-CM | POA: Diagnosis not present

## 2017-09-11 DIAGNOSIS — Z992 Dependence on renal dialysis: Secondary | ICD-10-CM | POA: Diagnosis not present

## 2017-09-13 ENCOUNTER — Encounter (INDEPENDENT_AMBULATORY_CARE_PROVIDER_SITE_OTHER): Payer: Self-pay | Admitting: Orthopedic Surgery

## 2017-09-13 ENCOUNTER — Ambulatory Visit (INDEPENDENT_AMBULATORY_CARE_PROVIDER_SITE_OTHER): Payer: Medicare Other | Admitting: Orthopedic Surgery

## 2017-09-13 DIAGNOSIS — M24562 Contracture, left knee: Secondary | ICD-10-CM

## 2017-09-13 DIAGNOSIS — Z89511 Acquired absence of right leg below knee: Secondary | ICD-10-CM | POA: Diagnosis not present

## 2017-09-13 DIAGNOSIS — I639 Cerebral infarction, unspecified: Secondary | ICD-10-CM

## 2017-09-13 DIAGNOSIS — Z89512 Acquired absence of left leg below knee: Secondary | ICD-10-CM

## 2017-09-13 NOTE — Progress Notes (Signed)
Office Visit Note   Patient: Oscar Castillo           Date of Birth: 1951/03/20           MRN: [id number] Visit Date: 09/13/2017              Requested by: [name], MD [address] [place], [place] [address] PCP: [name], MD  Chief Complaint  Patient presents with  . Left Leg - Follow-up, Pain      HPI: Patient is a 67 year old gentleman with diabetic insensate neuropathy bilateral transtibial amputee who is developing progressive flexion contracture of both knees he does go to dialysis patient has increased pain over the residual limb on the left worse than the right he takes gabapentin and is completing a course of doxycycline.  Assessment & Plan: Visit Diagnoses:  1. S/P BKA (below knee amputation) bilateral ([place])   2. Knee contracture, left     Plan: Patient will discontinue the doxycycline there is no open ulcers no cellulitis no signs of infection.  Patient was given instructions for knee extension exercises and passive stretching.  Discussed that if we cannot improve the flexion contracture of his knee we would need to consider an above-knee amputation on the left.  Follow-Up Instructions: Return in about 4 weeks (around 10/11/2017).   Ortho Exam  Patient is alert, oriented, no adenopathy, well-dressed, normal affect, normal respiratory effort. Examination patient is developing progressive contractures of both knees he lacks about 20 degrees to full extension on the left at about 20 degrees to full extension of the right with a very short amputated limb on the right.  Patient has no open ulcers no cellulitis he is exquisitely tender to palpation over the distal aspect of his tibia on the left he has skin and bone and no muscle or soft tissue but there is no open ulcers.  Patient's increasing pain is most likely due to his flexion contracture which is placing all his weight on the residual tibia.  Imaging: No results found. No images are attached to the  encounter.  Labs: Lab Results  Component Value Date   HGBA1C 7.1 (H) 02/10/2017   HGBA1C 6.4 (H) 09/15/2016   HGBA1C 6.2 12/11/2015   ESRSEDRATE 27 (H) 05/09/2013   CRP 6.7 (H) 05/09/2013   REPTSTATUS 07/12/2017 FINAL 07/07/2017   GRAMSTAIN  11/09/2016    FEW WBC PRESENT,BOTH PMN AND MONONUCLEAR NO ORGANISMS SEEN    CULT NO GROWTH 5 DAYS 07/07/2017   LABORGA STAPHYLOCOCCUS AUREUS 11/09/2016    @LABSALLVALUES (HGBA1)@  There is no height or weight on file to calculate BMI.  Orders:  No orders of the defined types were placed in this encounter.  No orders of the defined types were placed in this encounter.    Procedures: No procedures performed  Clinical Data: No additional findings.  ROS:  All other systems negative, except as noted in the HPI. Review of Systems  Objective: Vital Signs: There were no vitals taken for this visit.  Specialty Comments:  No specialty comments available.  PMFS History: Patient Active Problem List   Diagnosis Date Noted  . Chronic pain 09/07/2017  . Non-pressure chronic ulcer of left calf limited to breakdown of skin ([place]) 07/30/2017  . Dysphagia 07/10/2017  . Sacral ulcer ([place]) 05/07/2017  . CHB (complete heart block) ([place]) 04/29/2017  . Heart block AV third degree ([place]) 04/29/2017  . Complete heart block ([place])   . PAD (peripheral artery  disease) ([place])   . General weakness 02/10/2017  . Gait disorder 02/10/2017  . Chronic neck pain 12/14/2016  . MSSA bacteremia   . Sepsis ([place]) 11/08/2016  . Encounter for therapeutic drug monitoring 10/07/2016  . Diabetes mellitus with complication ([place])   . Anemia due to stage 5 chronic kidney disease ([place])   . Hypotension   . Melena 09/26/2016  . Pressure injury of skin 09/26/2016  . Benign neoplasm of descending colon   . Gastroesophageal reflux disease with esophagitis   . Duodenal ulcer without hemorrhage or perforation   . Blood loss anemia 09/15/2016  . Paroxysmal atrial  fibrillation ([place]) 09/15/2016  . Heme positive stool 09/15/2016  . RUQ pain 07/12/2016  . Hyperkalemia 07/01/2016  . Mixed hyperlipidemia 03/13/2016  . Anemia 12/31/2015  . Glaucoma 12/27/2015  . LBBB (left bundle branch block)   . Rash 11/15/2015  . Cramping of hands 11/15/2015  . ESRD on dialysis ([place]) 10/14/2015  . Liver fibrosis 10/07/2015  . Cigarette nicotine dependence without complication 35/57/3220  . Obesity (BMI 30-39.9) 06/06/2015  . Chronic bilateral low back pain without sciatica 06/06/2015  . Organ transplant candidate 05/17/2015  . DM (diabetes mellitus) with peripheral vascular complication ([place]) 25/42/7062  . Insulin-dependent diabetes mellitus with proliferative retinopathy ([place]) 05/17/2015  . Chronic bronchitis ([place]) 05/10/2015  . History of alcoholism ([place]) 05/10/2015  . History of peptic ulcer 05/10/2015  . Diarrhea 08/14/2014  . Dehydration 10/02/2013  . Fever 10/02/2013  . Altered mental status 10/01/2013  . Encephalopathy, toxic 10/01/2013  . Encephalopathy, metabolic 37/62/8315  . History of stroke 09/28/2013  . FTT (failure to thrive) in adult 09/28/2013  . Acute confusional state 09/28/2013  . Ulcer of sacral region, stage 3 ([place]) 09/26/2013  . S/P BKA (below knee amputation) bilateral ([place]) 09/08/2013  . ESRD on hemodialysis ([place]) 05/09/2013  . Cerebrovascular accident ([place]) 02/20/2013  . Acute blood loss anemia 10/28/2012  . Acute on chronic combined systolic and diastolic CHF (congestive heart failure) ([place]) 10/28/2012  . Constipation 10/28/2012  . Chronic combined systolic and diastolic heart failure ([place]) 10/28/2012  . GI bleed 10/27/2012  . Mass in rectum 10/27/2012  . Type 2 diabetes mellitus with chronic kidney disease on chronic dialysis, with long-term current use of insulin ([place]) 09/08/2012  . Essential (primary) hypertension 09/08/2012  . LVH (left ventricular hypertrophy)-severe concentric 06/13/2012  . Anemia in other chronic  diseases classified elsewhere 06/12/2012  . Hyperlipidemia, unspecified 01/30/2011  . CHOLELITHIASIS 08/01/2010  . BENIGN PROSTATIC HYPERTROPHY 08/01/2010  . History of cardiovascular disorder 08/06/2009  . Depression 03/18/2009  . NEPHROLITHIASIS, HX OF 03/18/2009  . Major depressive disorder, single episode, unspecified 03/18/2009  . Morbid obesity ([place]) 03/25/2007  . Hypertensive heart disease with CHF (congestive heart failure) ([place]) 03/25/2007  . GERD (gastroesophageal reflux disease) 03/25/2007  . Chronic hepatitis C without hepatic coma ([place]) 03/25/2007   Past Medical History:  Diagnosis Date  . Anemia   . Antral ulcer 2014   small  . BENIGN PROSTATIC HYPERTROPHY   . Bipolar disorder ([place])    "sometimes" (10/07/2016)  . CHOLELITHIASIS   . Chronic combined systolic and diastolic CHF (congestive heart failure) ([place])   . Complication of anesthesia    wife states pt had trouble waking up with in Nov., 2014  . CVA (cerebral vascular accident) ([name]) 07/2007   No residual effect  . DEPRESSION   . DIABETES MELLITUS, TYPE II    diet control.  No medication  since November 2015  .  ERECTILE DYSFUNCTION   . ESRD on hemodialysis ([place])    ESRD due to DM/HTN. .  HD TTS at [facility] on [place].  [name] GERD   . GI bleed    due to gastritis, discharged 10/02/16/notes 10/07/2016  . Hepatitis C    C - has been treated  . History of Clostridium difficile   . History of kidney stones   . HYPERTENSION   . LBBB (left bundle branch block)   . Morbid obesity ([place])   . PAF (paroxysmal atrial fibrillation) ([place])    a. Dx 12/2015.    Family History  Problem Relation Age of Onset  . Diabetes Mother   . Hypertension Mother   . Heart attack Father   . Hypertension Father   . Coronary artery disease Other     Past Surgical History:  Procedure Laterality Date  . AMPUTATION Left 05/12/2013   Procedure: AMPUTATION RAY;  Surgeon: Newt Minion, MD;  Location: [place];  Service: Orthopedics;   Laterality: Left;  Left Foot 1st Ray Amputation  . AMPUTATION Left 06/09/2013   Procedure: AMPUTATION BELOW KNEE;  Surgeon: Newt Minion, MD;  Location: [place];  Service: Orthopedics;  Laterality: Left;  Left Below Knee Amputation and removal proximal screws IM tibial nail  . AMPUTATION Right 09/08/2013   Procedure: AMPUTATION BELOW KNEE;  Surgeon: Newt Minion, MD;  Location: [place];  Service: Orthopedics;  Laterality: Right;  Right Below Knee Amputation  . AMPUTATION Right 10/11/2013   Procedure: AMPUTATION BELOW KNEE;  Surgeon: Newt Minion, MD;  Location: [place];  Service: Orthopedics;  Laterality: Right;  Right Below Knee Amputation Revision  . AV FISTULA PLACEMENT  06/14/2012   Procedure: ARTERIOVENOUS (AV) FISTULA CREATION;  Surgeon: Angelia Mould, MD;  Location: [facility] OR;  Service: Vascular;  Laterality: Left;  Left basilic vein transposition with fistula.  . AV FISTULA PLACEMENT Left 07/21/2017   Procedure: Attempt INSERTION OF ARTERIOVENOUS (AV) WITH  ARTEGRAFT Left  UPPER ARM;  Surgeon: Conrad Pettus, MD;  Location: [place];  Service: Vascular;  Laterality: Left;  . BASCILIC VEIN TRANSPOSITION Left 02/17/2017   Procedure: LEFT 1ST STAGE BRACHIAL VEIN TRANSPOSITION;  Surgeon: Conrad Tornillo, MD;  Location: [place];  Service: Vascular;  Laterality: Left;  . BASCILIC VEIN TRANSPOSITION Left 04/21/2017   Procedure: LEFT 2ND STAGE BRACHIAL VEIN TRANSPOSITION;  Surgeon: Conrad , MD;  Location: [place];  Service: Vascular;  Laterality: Left;  . COLONOSCOPY N/A 10/28/2012   Procedure: COLONOSCOPY;  Surgeon: Jeryl Columbia, MD;  Location: [facility] ENDOSCOPY;  Service: Endoscopy;  Laterality: N/A;  . COLONOSCOPY N/A 11/02/2012   Procedure: COLONOSCOPY;  Surgeon: Cleotis Nipper, MD;  Location: [facility] ENDOSCOPY;  Service: Endoscopy;  Laterality: N/A;  . COLONOSCOPY N/A 11/03/2012   Procedure: COLONOSCOPY;  Surgeon: Cleotis Nipper, MD;  Location: [facility] ENDOSCOPY;  Service: Endoscopy;  Laterality: N/A;  .  COLONOSCOPY N/A 09/16/2016   Procedure: COLONOSCOPY;  Surgeon: Jerene Bears, MD;  Location: [facility] ENDOSCOPY;  Service: Gastroenterology;  Laterality: N/A;  . ENTEROSCOPY N/A 11/08/2012   Procedure: ENTEROSCOPY;  Surgeon: Wonda Horner, MD;  Location: [facility] ENDOSCOPY;  Service: Endoscopy;  Laterality: N/A;  . ESOPHAGOGASTRODUODENOSCOPY N/A 11/02/2012   Procedure: ESOPHAGOGASTRODUODENOSCOPY (EGD);  Surgeon: Cleotis Nipper, MD;  Location: [facility] ENDOSCOPY;  Service: Endoscopy;  Laterality: N/A;  . ESOPHAGOGASTRODUODENOSCOPY (EGD) WITH PROPOFOL N/A 09/16/2016   Procedure: ESOPHAGOGASTRODUODENOSCOPY (EGD) WITH PROPOFOL;  Surgeon: Jerene Bears, MD;  Location: [facility] ENDOSCOPY;  Service: Gastroenterology;  Laterality: N/A;  . EXCHANGE OF A DIALYSIS CATHETER Left 11/13/2016   Procedure: EXCHANGE OF A DIALYSIS CATHETER;  Surgeon: Elam Dutch, MD;  Location: [place];  Service: Vascular;  Laterality: Left;  . EYE SURGERY Left    to remove scar tissue  . GIVENS CAPSULE STUDY N/A 11/04/2012   Procedure: GIVENS CAPSULE STUDY;  Surgeon: Cleotis Nipper, MD;  Location: [facility] ENDOSCOPY;  Service: Endoscopy;  Laterality: N/A;  . GIVENS CAPSULE STUDY N/A 09/29/2016   Procedure: GIVENS CAPSULE STUDY;  Surgeon: Manus Gunning, MD;  Location: [place];  Service: Gastroenterology;  Laterality: N/A;  try to keep pt up in bedside chair as much as possible during the study.    [name] HARDWARE REMOVAL Left 06/09/2013   Procedure: HARDWARE REMOVAL;  Surgeon: Newt Minion, MD;  Location: [place];  Service: Orthopedics;  Laterality: Left;  Left Below Knee Amputation  and Removal proximal screws IM tibial nail  . HEMATOMA EVACUATION Left 04/29/2017   Procedure: EVACUATION HEMATOMA LEFT ARM;  Surgeon: Conrad Kenner, MD;  Location: [place];  Service: Vascular;  Laterality: Left;  . INSERTION OF DIALYSIS CATHETER N/A 11/09/2016   Procedure: INSERTION OF TEMPORARY DIALYSIS CATHETER;  Surgeon: Elam Dutch, MD;  Location: [place];  Service:  Vascular;  Laterality: N/A;  . IR GENERIC HISTORICAL  09/27/2016   IR US GUIDE VASC ACCESS RIGHT 09/27/2016 Aletta Edouard, MD [facility]-INTER[facility]  . IR GENERIC HISTORICAL  09/27/2016   IR FLUORO GUIDE CV LINE RIGHT 09/27/2016 Aletta Edouard, MD [facility]  . LIGATION OF ARTERIOVENOUS  FISTULA Left 11/09/2016   Procedure: LIGATION OF ARTERIOVENOUS  FISTULA;  Surgeon: Elam Dutch, MD;  Location: [place];  Service: Vascular;  Laterality: Left;  . ORIF FIBULA FRACTURE Left 09/09/2012   Procedure: OPEN REDUCTION INTERNAL FIXATION (ORIF) FIBULA FRACTURE;  Surgeon: Johnny Bridge, MD;  Location: [place];  Service: Orthopedics;  Laterality: Left;  . PACEMAKER IMPLANT N/A 04/30/2017   Procedure: PACEMAKER IMPLANT;  Surgeon: Evans Lance, MD;  Location: [place] CV LAB;  Service: Cardiovascular;  Laterality: N/A;  . TEMPORARY PACEMAKER N/A 04/29/2017   Procedure: TEMPORARY PACEMAKER;  Surgeon: Lorretta Harp, MD;  Location: [place] CV LAB;  Service: Cardiovascular;  Laterality: N/A;  . TIBIA IM NAIL INSERTION Left 09/09/2012   Procedure: INTRAMEDULLARY (IM) NAIL TIBIAL;  Surgeon: Johnny Bridge, MD;  Location: [place];  Service: Orthopedics;  Laterality: Left;  left tibial nail and open reduction internal fixation left fibula fracture  . UPPER EXTREMITY VENOGRAPHY N/A 12/14/2016   Procedure: Bilateral Upper Extremity Venography and Central Venography;  Surgeon: Conrad Gibbs, MD;  Location: [place] CV LAB;  Service: Cardiovascular;  Laterality: N/A;   Social History   Occupational History  . Occupation: disabled due to stroke    Employer: RETIRED  Tobacco Use  . Smoking status: Never Smoker  . Smokeless tobacco: Never Used  Substance and Sexual Activity  . Alcohol use: No  . Drug use: No  . Sexual activity: Yes    Birth control/protection: None

## 2017-09-14 ENCOUNTER — Other Ambulatory Visit: Payer: Self-pay | Admitting: Licensed Clinical Social Worker

## 2017-09-14 DIAGNOSIS — D631 Anemia in chronic kidney disease: Secondary | ICD-10-CM | POA: Diagnosis not present

## 2017-09-14 DIAGNOSIS — N186 End stage renal disease: Secondary | ICD-10-CM | POA: Diagnosis not present

## 2017-09-14 DIAGNOSIS — N2581 Secondary hyperparathyroidism of renal origin: Secondary | ICD-10-CM | POA: Diagnosis not present

## 2017-09-14 DIAGNOSIS — Z992 Dependence on renal dialysis: Secondary | ICD-10-CM | POA: Diagnosis not present

## 2017-09-14 DIAGNOSIS — D509 Iron deficiency anemia, unspecified: Secondary | ICD-10-CM | POA: Diagnosis not present

## 2017-09-14 DIAGNOSIS — E1122 Type 2 diabetes mellitus with diabetic chronic kidney disease: Secondary | ICD-10-CM | POA: Diagnosis not present

## 2017-09-14 NOTE — Patient Outreach (Signed)
[facility]) Care Management  09/14/2017  Oscar Castillo 23-Mar-1951 [id number]  Assessment- CSW completed call to SCAT office to follow up on patient's application status but was unable to reach anyone. CSW left a detailed voice message and requested a return call with updates on patient's SCAT application status. CSW completed call to DSS in order to place In [place] referral. CSW was unable to reach the caseworker who is taking referrals today. CSW left a voice message encouraging a return call.  Plan-CSW will await to hear back from SCAT. CSW will continue to make attempts to reach In [place] program in order to make referral.  Oscar Castillo, BSW, MSW, [email or web address] Phone: [phone] Fax: [phone]

## 2017-09-16 ENCOUNTER — Other Ambulatory Visit: Payer: Self-pay | Admitting: Licensed Clinical Social Worker

## 2017-09-16 DIAGNOSIS — E1122 Type 2 diabetes mellitus with diabetic chronic kidney disease: Secondary | ICD-10-CM | POA: Diagnosis not present

## 2017-09-16 DIAGNOSIS — D509 Iron deficiency anemia, unspecified: Secondary | ICD-10-CM | POA: Diagnosis not present

## 2017-09-16 DIAGNOSIS — N186 End stage renal disease: Secondary | ICD-10-CM | POA: Diagnosis not present

## 2017-09-16 DIAGNOSIS — Z992 Dependence on renal dialysis: Secondary | ICD-10-CM | POA: Diagnosis not present

## 2017-09-16 DIAGNOSIS — N2581 Secondary hyperparathyroidism of renal origin: Secondary | ICD-10-CM | POA: Diagnosis not present

## 2017-09-16 DIAGNOSIS — D631 Anemia in chronic kidney disease: Secondary | ICD-10-CM | POA: Diagnosis not present

## 2017-09-16 NOTE — Patient Outreach (Signed)
[facility]) Care Management  09/16/2017  Oscar Castillo August 14, 1950 [id number]  Assessment- CSW received return call from [place] with SCAT services. She reports that patient has already came in for his in person interview and has been approved for services. CSW completed call to In [facility] and was able to successfully reach a DSS caseworker in order to place referral. Patient was measured as high risk and therefore will be pushed up on the wait list for services.   CSW completed call to patient's residence and spouse answered. Spouse confirmed that patient has been approved for SCAT. CSW provided updates listed above to spouse. She is appreciative of social work assistance with this. CSW questioned if they received information in the mail yet from [place] and she declined. CSW will check back in two weeks to see if they received mailed information and then will review resources before closing case.  THN CM Care Plan Problem One     Most Recent Value  Care Plan Problem One  Lack of support within the home and need for community resource support  Role Documenting the Problem One  Clinical Social Worker  Care Plan for Problem One  Active  THN CM Short Term Goal #1   Patient will gain stable transportation within 30 days as evidenced by spouse self report  THN CM Short Term Goal #1 Start Date  08/26/17  [facility] CM Short Term Goal #1 Met Date  09/16/17  Interventions for Short Term Goal #1  GOAL MET. SCAT GAINED  THN CM Short Term Goal #2   Patient will successfully be placed on the wait list for In [place] due to need for more support in the home within 30 days per program confirmation.  THN CM Short Term Goal #2 Start Date  09/03/17  Interventions for Short Term Goal #2  Family are still waiting on In Home Aide information to be mailed to them. Family report not receiving in the mail yet.     Plan-CSW will follow up in two weeks to ensure that mailed resource  information was received.  Oscar Castillo, BSW, MSW, [email or web address] Phone: [phone] Fax: [phone]

## 2017-09-17 ENCOUNTER — Encounter: Payer: Self-pay | Admitting: Internal Medicine

## 2017-09-18 DIAGNOSIS — N186 End stage renal disease: Secondary | ICD-10-CM | POA: Diagnosis not present

## 2017-09-18 DIAGNOSIS — N2581 Secondary hyperparathyroidism of renal origin: Secondary | ICD-10-CM | POA: Diagnosis not present

## 2017-09-18 DIAGNOSIS — D509 Iron deficiency anemia, unspecified: Secondary | ICD-10-CM | POA: Diagnosis not present

## 2017-09-18 DIAGNOSIS — Z992 Dependence on renal dialysis: Secondary | ICD-10-CM | POA: Diagnosis not present

## 2017-09-18 DIAGNOSIS — D631 Anemia in chronic kidney disease: Secondary | ICD-10-CM | POA: Diagnosis not present

## 2017-09-18 DIAGNOSIS — E1122 Type 2 diabetes mellitus with diabetic chronic kidney disease: Secondary | ICD-10-CM | POA: Diagnosis not present

## 2017-09-21 DIAGNOSIS — N186 End stage renal disease: Secondary | ICD-10-CM | POA: Diagnosis not present

## 2017-09-21 DIAGNOSIS — I4891 Unspecified atrial fibrillation: Secondary | ICD-10-CM | POA: Diagnosis not present

## 2017-09-21 DIAGNOSIS — Z5181 Encounter for therapeutic drug level monitoring: Secondary | ICD-10-CM | POA: Diagnosis not present

## 2017-09-21 DIAGNOSIS — D509 Iron deficiency anemia, unspecified: Secondary | ICD-10-CM | POA: Diagnosis not present

## 2017-09-21 DIAGNOSIS — E1122 Type 2 diabetes mellitus with diabetic chronic kidney disease: Secondary | ICD-10-CM | POA: Diagnosis not present

## 2017-09-21 DIAGNOSIS — N2581 Secondary hyperparathyroidism of renal origin: Secondary | ICD-10-CM | POA: Diagnosis not present

## 2017-09-21 DIAGNOSIS — D631 Anemia in chronic kidney disease: Secondary | ICD-10-CM | POA: Diagnosis not present

## 2017-09-21 DIAGNOSIS — Z992 Dependence on renal dialysis: Secondary | ICD-10-CM | POA: Diagnosis not present

## 2017-09-22 LAB — PROTIME-INR: INR: 13.5 — AB (ref 0.9–1.1)

## 2017-09-23 ENCOUNTER — Telehealth: Payer: Self-pay | Admitting: Pharmacist

## 2017-09-23 ENCOUNTER — Ambulatory Visit (INDEPENDENT_AMBULATORY_CARE_PROVIDER_SITE_OTHER): Payer: Medicare Other | Admitting: *Deleted

## 2017-09-23 ENCOUNTER — Encounter (INDEPENDENT_AMBULATORY_CARE_PROVIDER_SITE_OTHER): Payer: Self-pay

## 2017-09-23 DIAGNOSIS — Z992 Dependence on renal dialysis: Secondary | ICD-10-CM | POA: Diagnosis not present

## 2017-09-23 DIAGNOSIS — I639 Cerebral infarction, unspecified: Secondary | ICD-10-CM

## 2017-09-23 DIAGNOSIS — Z8679 Personal history of other diseases of the circulatory system: Secondary | ICD-10-CM

## 2017-09-23 DIAGNOSIS — E1122 Type 2 diabetes mellitus with diabetic chronic kidney disease: Secondary | ICD-10-CM | POA: Diagnosis not present

## 2017-09-23 DIAGNOSIS — I4891 Unspecified atrial fibrillation: Secondary | ICD-10-CM | POA: Diagnosis not present

## 2017-09-23 DIAGNOSIS — D631 Anemia in chronic kidney disease: Secondary | ICD-10-CM | POA: Diagnosis not present

## 2017-09-23 DIAGNOSIS — Z5181 Encounter for therapeutic drug level monitoring: Secondary | ICD-10-CM

## 2017-09-23 DIAGNOSIS — N186 End stage renal disease: Secondary | ICD-10-CM | POA: Diagnosis not present

## 2017-09-23 DIAGNOSIS — D509 Iron deficiency anemia, unspecified: Secondary | ICD-10-CM | POA: Diagnosis not present

## 2017-09-23 DIAGNOSIS — N2581 Secondary hyperparathyroidism of renal origin: Secondary | ICD-10-CM | POA: Diagnosis not present

## 2017-09-23 LAB — POCT INR: INR: 4.3

## 2017-09-23 NOTE — Telephone Encounter (Signed)
Message left on machine by wife that patient "coumadin level is 13 per tech at dialysis." Spoke with wife who reports that per nurse tech INR is 13. Patient reports that dialysis did not draw INR.   Called dialysis center [phone]) and spoke with Levada Dy - tech taking care of Oscar Castillo who reports that INR was drawn on 2/26 and result >13.5. She states that she cannot draw a repeat as he is on treatment (of note I had just spoken to patient at home). She states that he does not have any s/s of bleeding.  Called back to patient home and requested that they come in to be checked here today as report sent over does not state if 13 is PT or INR. Wife states she will bring him to appt. Patient then gets on line and states that they did not draw INR at dialysis and he will not come in this afternoon to be checked. He states he is not having any bleeding or bruising and will keep appt as scheduled. Again recommended that he come be checked to be safe and he states that he manages his care and not his wife. Advised we will make appt for this afternoon and he can decide if he would like to come or not. Of note his INR was in range when we saw him and we have had these strange readings from dialysis in the past and thus I am not sure that his level actually is 13. Ultimately I cannot force patient to come to be checked, but have reiterated the importance of coming to be checked so that he is managed appropriately. He states understanding and will decide about appt this afternoon.

## 2017-09-23 NOTE — Patient Instructions (Signed)
Description   Do not take any Coumadin today and take 1/2 tablet tomorrow then continue on same dosage 1 tablet daily.  Recheck INR in 2 weeks. Call us with any medication changes or concerns # [phone] Coumadin Clinic, # [phone] Main number.

## 2017-09-24 DIAGNOSIS — E1122 Type 2 diabetes mellitus with diabetic chronic kidney disease: Secondary | ICD-10-CM | POA: Diagnosis not present

## 2017-09-24 DIAGNOSIS — Z992 Dependence on renal dialysis: Secondary | ICD-10-CM | POA: Diagnosis not present

## 2017-09-24 DIAGNOSIS — N186 End stage renal disease: Secondary | ICD-10-CM | POA: Diagnosis not present

## 2017-09-25 DIAGNOSIS — D631 Anemia in chronic kidney disease: Secondary | ICD-10-CM | POA: Diagnosis not present

## 2017-09-25 DIAGNOSIS — N2581 Secondary hyperparathyroidism of renal origin: Secondary | ICD-10-CM | POA: Diagnosis not present

## 2017-09-25 DIAGNOSIS — N186 End stage renal disease: Secondary | ICD-10-CM | POA: Diagnosis not present

## 2017-09-25 DIAGNOSIS — D509 Iron deficiency anemia, unspecified: Secondary | ICD-10-CM | POA: Diagnosis not present

## 2017-09-27 ENCOUNTER — Other Ambulatory Visit: Payer: Self-pay | Admitting: Licensed Clinical Social Worker

## 2017-09-27 NOTE — Patient Outreach (Signed)
[facility]) Care Management  09/27/2017  Oscar Castillo July 19, 1951 [id number]  Assessment- CSW completed outreach call to patient's residence and spouse answered. HIPPA verifications provided. Spouse confirms that she has now received community resource information that was mailed to her by CSW. CSW completed review of resources over the phone. Family understands that they can contact In [name] at any time to get an update on where patient is on the wait list. Patient now has stable transportation through [place] and all social work goals have been met. Family is agreeable to case closure at this time and will contact this [facility] CSW in the future if any case management needs arise.  THN CM Care Plan Problem One     Most Recent Value  Care Plan Problem One  Lack of support within the home and need for community resource support  Role Documenting the Problem One  Clinical Social Worker  Care Plan for Problem One  Active  THN CM Short Term Goal #1   Patient will gain stable transportation within 30 days as evidenced by spouse self report  THN CM Short Term Goal #1 Start Date  08/26/17  [facility] CM Short Term Goal #1 Met Date  09/16/17  Interventions for Short Term Goal #1  GOAL MET. SCAT GAINED  THN CM Short Term Goal #2   Patient will successfully be placed on the wait list for In [place] due to need for more support in the home within 30 days per program confirmation.  THN CM Short Term Goal #2 Start Date  09/03/17  [facility] CM Short Term Goal #2 Met Date  09/27/17  Interventions for Short Term Goal #2  GOAL MET. PT IS ON WAIT LIST     Plan-CSW will inform PCP and [facility] Care Management Assistant of case closure. CSW will complete case closure at this time.  [name], BSW, MSW, [email or web address] Phone: [phone] Fax: [phone]

## 2017-09-28 DIAGNOSIS — D631 Anemia in chronic kidney disease: Secondary | ICD-10-CM | POA: Diagnosis not present

## 2017-09-28 DIAGNOSIS — N2581 Secondary hyperparathyroidism of renal origin: Secondary | ICD-10-CM | POA: Diagnosis not present

## 2017-09-28 DIAGNOSIS — D509 Iron deficiency anemia, unspecified: Secondary | ICD-10-CM | POA: Diagnosis not present

## 2017-09-28 DIAGNOSIS — N186 End stage renal disease: Secondary | ICD-10-CM | POA: Diagnosis not present

## 2017-09-30 DIAGNOSIS — N2581 Secondary hyperparathyroidism of renal origin: Secondary | ICD-10-CM | POA: Diagnosis not present

## 2017-09-30 DIAGNOSIS — D631 Anemia in chronic kidney disease: Secondary | ICD-10-CM | POA: Diagnosis not present

## 2017-09-30 DIAGNOSIS — N186 End stage renal disease: Secondary | ICD-10-CM | POA: Diagnosis not present

## 2017-09-30 DIAGNOSIS — D509 Iron deficiency anemia, unspecified: Secondary | ICD-10-CM | POA: Diagnosis not present

## 2017-10-02 DIAGNOSIS — N186 End stage renal disease: Secondary | ICD-10-CM | POA: Diagnosis not present

## 2017-10-02 DIAGNOSIS — D631 Anemia in chronic kidney disease: Secondary | ICD-10-CM | POA: Diagnosis not present

## 2017-10-02 DIAGNOSIS — D509 Iron deficiency anemia, unspecified: Secondary | ICD-10-CM | POA: Diagnosis not present

## 2017-10-02 DIAGNOSIS — N2581 Secondary hyperparathyroidism of renal origin: Secondary | ICD-10-CM | POA: Diagnosis not present

## 2017-10-05 ENCOUNTER — Ambulatory Visit (INDEPENDENT_AMBULATORY_CARE_PROVIDER_SITE_OTHER): Payer: Medicare Other | Admitting: *Deleted

## 2017-10-05 DIAGNOSIS — Z5181 Encounter for therapeutic drug level monitoring: Secondary | ICD-10-CM

## 2017-10-05 DIAGNOSIS — I639 Cerebral infarction, unspecified: Secondary | ICD-10-CM

## 2017-10-05 DIAGNOSIS — Z8679 Personal history of other diseases of the circulatory system: Secondary | ICD-10-CM

## 2017-10-05 DIAGNOSIS — D631 Anemia in chronic kidney disease: Secondary | ICD-10-CM | POA: Diagnosis not present

## 2017-10-05 DIAGNOSIS — N186 End stage renal disease: Secondary | ICD-10-CM | POA: Diagnosis not present

## 2017-10-05 DIAGNOSIS — D509 Iron deficiency anemia, unspecified: Secondary | ICD-10-CM | POA: Diagnosis not present

## 2017-10-05 DIAGNOSIS — N2581 Secondary hyperparathyroidism of renal origin: Secondary | ICD-10-CM | POA: Diagnosis not present

## 2017-10-05 DIAGNOSIS — I4891 Unspecified atrial fibrillation: Secondary | ICD-10-CM | POA: Diagnosis not present

## 2017-10-05 LAB — POCT INR: INR: 3.4

## 2017-10-05 NOTE — Patient Instructions (Signed)
Description   Do not take any Coumadin today then start taking 1 tablet daily except 1/2 tablet on Saturdays.  Recheck INR in 3 weeks with MD appt. Call us with any medication changes or concerns # [phone] Coumadin Clinic, # [phone] Main number.

## 2017-10-06 ENCOUNTER — Ambulatory Visit: Payer: Self-pay | Admitting: Internal Medicine

## 2017-10-06 ENCOUNTER — Emergency Department: Admit: 2017-10-06 | Payer: MEDICARE

## 2017-10-06 ENCOUNTER — Inpatient Hospital Stay
Admission: EM | Admit: 2017-10-06 | Discharge: 2017-10-11 | Disposition: A | Discharge status: Home / Self Care | Payer: Medicare Other | Source: Other Acute Inpatient Hospital | Admitting: Internal Medicine

## 2017-10-06 DIAGNOSIS — R079 Chest pain, unspecified: Principal | ICD-10-CM

## 2017-10-06 LAB — COMPREHENSIVE METABOLIC PANEL
ALT: 46 U/L — ABNORMAL HIGH (ref 0–40)
AST: 60 U/L — ABNORMAL HIGH (ref 0–39)
Albumin: 4.5 g/dL (ref 3.5–5.2)
Alkaline Phosphatase: 93 U/L (ref 40–129)
Anion Gap: 14 mmol/L (ref 7–16)
BUN: 15 mg/dL (ref 8–23)
CO2: 23 mmol/L (ref 22–29)
Calcium: 9.3 mg/dL (ref 8.6–10.2)
Chloride: 97 mmol/L — ABNORMAL LOW (ref 98–107)
Creatinine: 1 mg/dL (ref 0.7–1.2)
GFR African American: >60
GFR Non-African American: >60 mL/min/{1.73_m2} (ref >=60)
Glucose: 154 mg/dL — ABNORMAL HIGH (ref 74–99)
Potassium: 4.1 mmol/L (ref 3.5–5.0)
Sodium: 134 mmol/L (ref 132–146)
Total Bilirubin: 0.3 mg/dL (ref 0.0–1.2)
Total Protein: 7.7 g/dL (ref 6.4–8.3)

## 2017-10-06 LAB — CBC WITH AUTO DIFFERENTIAL
Basophils %: 0.4 % (ref 0.0–2.0)
Basophils Absolute: 0.02 E9/L (ref 0.00–0.20)
Eosinophils %: 0 % (ref 0.0–6.0)
Eosinophils Absolute: 0 E9/L — ABNORMAL LOW (ref 0.05–0.50)
Hematocrit: 47.5 % (ref 37.0–54.0)
Hemoglobin: 15.8 g/dL (ref 12.5–16.5)
Immature Granulocytes #: 0.04 E9/L
Immature Granulocytes %: 0.7 % (ref 0.0–5.0)
Lymphocytes %: 27.5 % (ref 20.0–42.0)
Lymphocytes Absolute: 1.56 E9/L (ref 1.50–4.00)
MCH: 31.3 pg (ref 26.0–35.0)
MCHC: 33.3 % (ref 32.0–34.5)
MCV: 94.1 fL (ref 80.0–99.9)
MPV: 10.4 fL (ref 7.0–12.0)
Monocytes %: 13.2 % — ABNORMAL HIGH (ref 2.0–12.0)
Monocytes Absolute: 0.75 E9/L (ref 0.10–0.95)
Neutrophils %: 58.2 % (ref 43.0–80.0)
Neutrophils Absolute: 3.31 E9/L (ref 1.80–7.30)
Platelets: 145 E9/L (ref 130–450)
RBC: 5.05 E12/L (ref 3.80–5.80)
RDW: 13.2 fL (ref 11.5–15.0)
WBC: 5.7 E9/L (ref 4.5–11.5)

## 2017-10-06 LAB — TROPONIN: Troponin: <0.01 ng/mL (ref 0.00–0.03)

## 2017-10-06 LAB — TSH: TSH: 7.11 u[IU]/mL — ABNORMAL HIGH (ref 0.270–4.200)

## 2017-10-06 NOTE — ED Provider Notes (Signed)
ED Attending  CC: No    Department of Emergency Medicine   ED  Provider Note  Admit Date/RoomTime: 10/06/2017  7:36 PM  ED Room: 40/40   Chief Complaint     Palpitations (Patient feels heart racing and as if he is SOB)    History of Present Illness   Source of history provided by:  patient.  History/Exam Limitations: none.       Dylan Beasley is a 67 y.o. old male who has a past medical history of:   Past Medical History:   Diagnosis Date   . Atrial fibrillation (HCC)    . HLD (hyperlipidemia)    . Hypertension    . Inguinal hernia       presents to the emergency department by private vehicle, with complaints of gradual onset left chest discomfort described as heaviness beginning a few hour(s) prior to arrival.  The pain does not  radiate to neck, back or abdomen, but goes down left arm.  Duration of symptoms: to the present time.  Symptom(s) now is mild.   His symptoms are associated with dyspnea.   The symptoms are worsened by excertion and relieved by nothing tried.  There has been No edema and No syncope associated with complaint. He does have a history of afib, not on any anticoagulation due to previous accidental anticoagulation overdose.  His cardiac risk factors are heart score 6. Care prior to arrival consisted of nothing tried.       ROS    Pertinent positives and negatives are stated within HPI, all other systems reviewed and are negative.    No past surgical history on file.Social History:  reports that he has been smoking cigarettes.  He has a 2.50 pack-year smoking history. He does not have any smokeless tobacco history on file. He reports that he drinks alcohol. He reports that he does not use drugs.  Family History: family history is not on file.   Allergies: Patient has no known allergies.    Physical Exam           ED Triage Vitals   BP Temp Temp Source Pulse Resp SpO2 Height Weight   10/06/17 1601 10/06/17 1601 10/06/17 2130 10/06/17 1557 10/06/17 1601 10/06/17 1557 10/06/17 1601 10/06/17 1601   (!)  185/128 97.6 F (36.4 C) Oral 78 14 100 % 6' (1.829 m) 175 lb (79.4 kg)      Oxygen Saturation Interpretation: Normal.    General Appearance/Constitutional:  Alert, development consistent with age, NAD.  HEENT:  NC/NT. PERRLA.  Airway patent.  Neck:  Normal ROM.  Supple.  Respiratory:  Clear to auscultation and breath sounds equal.  CV:  Regular rate irregular rhythm, normal heart sounds, without pathological murmurs, ectopy, gallops, or rubs.  Chest:  Symmetrical without visible rash or tenderness.  GI:  Abdomen Soft, nontender, good bowel sounds.  No firm or pulsatile mass.  Back:  No costovertebral tenderness.  Extremities: No tenderness or edema noted.  Lymphatics: no lymphadenopathy noted  Integument:  Normal turgor.  Warm, dry, without visible rash, unless noted elsewhere.  Neurological:  Oriented.  Motor functions intact.   Psychiatric:  Affect normal.    Lab / Imaging Results   (All laboratory and radiology results have been personally reviewed by myself)  Labs:  Results for orders placed or performed during the hospital encounter of 10/06/17   CBC auto differential   Result Value Ref Range    WBC 5.7 4.5 - 11.5 E9/L  RBC 5.05 3.80 - 5.80 E12/L    Hemoglobin 15.8 12.5 - 16.5 g/dL    Hematocrit 10.6 26.9 - 54.0 %    MCV 94.1 80.0 - 99.9 fL    MCH 31.3 26.0 - 35.0 pg    MCHC 33.3 32.0 - 34.5 %    RDW 13.2 11.5 - 15.0 fL    Platelets 145 130 - 450 E9/L    MPV 10.4 7.0 - 12.0 fL    Neutrophils % 58.2 43.0 - 80.0 %    Immature Granulocytes % 0.7 0.0 - 5.0 %    Lymphocytes % 27.5 20.0 - 42.0 %    Monocytes % 13.2 (H) 2.0 - 12.0 %    Eosinophils % 0.0 0.0 - 6.0 %    Basophils % 0.4 0.0 - 2.0 %    Neutrophils # 3.31 1.80 - 7.30 E9/L    Immature Granulocytes # 0.04 E9/L    Lymphocytes # 1.56 1.50 - 4.00 E9/L    Monocytes # 0.75 0.10 - 0.95 E9/L    Eosinophils # 0.00 (L) 0.05 - 0.50 E9/L    Basophils # 0.02 0.00 - 0.20 E9/L   Comprehensive Metabolic Panel   Result Value Ref Range    Sodium 134 132 - 146 mmol/L     Potassium 4.1 3.5 - 5.0 mmol/L    Chloride 97 (L) 98 - 107 mmol/L    CO2 23 22 - 29 mmol/L    Anion Gap 14 7 - 16 mmol/L    Glucose 154 (H) 74 - 99 mg/dL    BUN 15 8 - 23 mg/dL    CREATININE 1.0 0.7 - 1.2 mg/dL    GFR Non-African American >60 >=60 mL/min/1.73    GFR African American >60     Calcium 9.3 8.6 - 10.2 mg/dL    Total Protein 7.7 6.4 - 8.3 g/dL    Alb 4.5 3.5 - 5.2 g/dL    Total Bilirubin 0.3 0.0 - 1.2 mg/dL    Alkaline Phosphatase 93 40 - 129 U/L    ALT 46 (H) 0 - 40 U/L    AST 60 (H) 0 - 39 U/L   Troponin   Result Value Ref Range    Troponin <0.01 0.00 - 0.03 ng/mL   TSH without Reflex   Result Value Ref Range    TSH 7.110 (H) 0.270 - 4.200 uIU/mL   Protime-INR   Result Value Ref Range    Protime 12.7 (H) 9.3 - 12.4 sec    INR 1.1    Brain Natriuretic Peptide   Result Value Ref Range    Pro-BNP 725 (H) 0 - 125 pg/mL   APTT   Result Value Ref Range    aPTT 31.2 24.5 - 35.1 sec       Imaging:  All Radiology results interpreted by Radiologist unless otherwise noted.  XR CHEST STANDARD (2 VW)   Final Result   No active infiltrate or vascular congestion.              EKG #1:  Interpreted by emergency department physician unless otherwise noted.  Time: 1615   Rate: 77  Rhythm: Atrial fibrillation.  Interpretation: unchanged from previous tracings.    ED Course / Medical Decision Making     Medications   aspirin chewable tablet 324 mg (has no administration in time range)            Consultations:  IP CONSULT TO INTERNAL MEDICINE    Procedures:   none    MDM:  Patient is well-appearing, afebrile. Vital signs stable. Labs obtained, reassuring. EKG reviewed, patient is atrial fibrillation, rate controlled. Presents with chest pain rated on his left arm. Heart score 6. Case discussed with Dr. Quentin Mulling agreeable to admit.  Noted as patient was revitaled for admission, noted to have low grade fever therefore respiratory panel ordered and pending.    Counseling:   I have spoken with the patient and  discussed today's results, in addition to providing specific details for the plan of care and counseling regarding the diagnosis and prognosis and are agreeable with the plan.       Assessment      1. Chest pain, unspecified type      This patient's ED course included: a personal history and physicial examination, re-evaluation prior to disposition, multiple bedside re-evaluations, IV medications, cardiac monitoring and continuous pulse oximetry  This patient has remained hemodynamically stable during their ED course.   Plan   Admit to telemetry.  Patient condition is good.    New Medications     New Prescriptions    No medications on file     Electronically signed by Henrietta Dine, APRN - CNP   DD: 10/06/17  **This report was transcribed using voice recognition software. Every effort was made to ensure accuracy; however, inadvertent computerized transcription errors may be present.  END OF PROVIDER NOTE      Ezekiel Slocumb, APRN - CNP  10/06/17 2242

## 2017-10-07 ENCOUNTER — Encounter

## 2017-10-07 ENCOUNTER — Observation Stay: Admit: 2017-10-07 | Payer: MEDICARE

## 2017-10-07 DIAGNOSIS — R079 Chest pain, unspecified: Secondary | ICD-10-CM

## 2017-10-07 DIAGNOSIS — N2581 Secondary hyperparathyroidism of renal origin: Secondary | ICD-10-CM | POA: Diagnosis not present

## 2017-10-07 DIAGNOSIS — D631 Anemia in chronic kidney disease: Secondary | ICD-10-CM | POA: Diagnosis not present

## 2017-10-07 DIAGNOSIS — D509 Iron deficiency anemia, unspecified: Secondary | ICD-10-CM | POA: Diagnosis not present

## 2017-10-07 DIAGNOSIS — N186 End stage renal disease: Secondary | ICD-10-CM | POA: Diagnosis not present

## 2017-10-07 LAB — BRAIN NATRIURETIC PEPTIDE
Pro-BNP: 725 pg/mL — ABNORMAL HIGH (ref 0–125)
Pro-BNP: 1189 pg/mL — ABNORMAL HIGH (ref 0–125)

## 2017-10-07 LAB — RESPIRATORY PANEL, MOLECULAR
Film Array Adenovirus: NOT DETECTED
Film Array Bordetella Pertusis: NOT DETECTED
Film Array Chlamydophilia Pneumoniae: NOT DETECTED
Film Array Conoravirus NL63: NOT DETECTED
Film Array Coronavirus 229E: NOT DETECTED
Film Array Coronavirus HKU1: NOT DETECTED
Film Array Coronavirus OC43: NOT DETECTED
Film Array Influenza A Virus H3: NOT DETECTED
Film Array Influenza B: NOT DETECTED
Film Array Metapneumovirus: NOT DETECTED
Film Array Mycoplasma Pneumoniae: NOT DETECTED
Film Array Parainfluenza Virus 1: NOT DETECTED
Film Array Parainfluenza Virus 2: NOT DETECTED
Film Array Parainfluenza Virus 3: NOT DETECTED
Film Array Parainfluenza Virus 4: NOT DETECTED
Film Array Respiratory Syncitial Virus: NOT DETECTED
Film Array Rhinovirus/Enterovirus: NOT DETECTED
Organism: DETECTED — AB
Organism: DETECTED — AB
Organism: DETECTED — AB

## 2017-10-07 LAB — BASIC METABOLIC PANEL W/ REFLEX TO MG FOR LOW K
Anion Gap: 16 mmol/L (ref 7–16)
BUN: 20 mg/dL (ref 8–23)
CO2: 21 mmol/L — ABNORMAL LOW (ref 22–29)
Calcium: 9.6 mg/dL (ref 8.6–10.2)
Chloride: 100 mmol/L (ref 98–107)
Creatinine: 1.2 mg/dL (ref 0.7–1.2)
GFR African American: >60
GFR Non-African American: >60 mL/min/{1.73_m2} (ref >=60)
Glucose: 124 mg/dL — ABNORMAL HIGH (ref 74–99)
Potassium reflex Magnesium: 4.4 mmol/L (ref 3.5–5.0)
Sodium: 137 mmol/L (ref 132–146)

## 2017-10-07 LAB — LIPID PANEL
Cholesterol, Total: 132 mg/dL (ref 0–199)
HDL: 45 mg/dL (ref >40)
LDL Calculated: 63 mg/dL (ref 0–99)
Triglycerides: 119 mg/dL (ref 0–149)
VLDL Cholesterol Calculated: 24 mg/dL

## 2017-10-07 LAB — CBC
Hematocrit: 49 % (ref 37.0–54.0)
Hemoglobin: 16.5 g/dL (ref 12.5–16.5)
MCH: 31.6 pg (ref 26.0–35.0)
MCHC: 33.7 % (ref 32.0–34.5)
MCV: 93.9 fL (ref 80.0–99.9)
MPV: 11.3 fL (ref 7.0–12.0)
Platelets: 148 E9/L (ref 130–450)
RBC: 5.22 E12/L (ref 3.80–5.80)
RDW: 13.2 fL (ref 11.5–15.0)
WBC: 6.5 E9/L (ref 4.5–11.5)

## 2017-10-07 LAB — HEMOGLOBIN A1C: Hemoglobin A1C: 6.1 % — ABNORMAL HIGH (ref 4.0–5.6)

## 2017-10-07 LAB — MAGNESIUM: Magnesium: 2.1 mg/dL (ref 1.6–2.6)

## 2017-10-07 LAB — TROPONIN
Troponin: <0.01 ng/mL (ref 0.00–0.03)
Troponin: <0.01 ng/mL (ref 0.00–0.03)

## 2017-10-07 LAB — APTT: aPTT: 31.2 s (ref 24.5–35.1)

## 2017-10-07 LAB — PROTIME-INR
INR: 1.1
Protime: 12.7 s — ABNORMAL HIGH (ref 9.3–12.4)

## 2017-10-07 LAB — C-REACTIVE PROTEIN: CRP: 0.3 mg/dL (ref 0.0–0.4)

## 2017-10-07 LAB — HIGH SENSITIVITY CRP: CRP High Sensitivity: 3 mg/L (ref 0.0–3.0)

## 2017-10-07 LAB — HCG, SERUM, QUALITATIVE: hCG Qual: NEGATIVE

## 2017-10-07 LAB — T4, FREE: T4 Free: 1.18 ng/dL (ref 0.93–1.70)

## 2017-10-07 MED ORDER — ONDANSETRON HCL 4 MG/2ML IJ SOLN
4 MG/2ML | Freq: Four times a day (QID) | INTRAMUSCULAR | Status: DC | PRN
Start: 2017-10-07 — End: 2017-10-11

## 2017-10-07 MED ORDER — NORMAL SALINE FLUSH 0.9 % IV SOLN
0.9 % | Freq: Two times a day (BID) | INTRAVENOUS | Status: DC
Start: 2017-10-07 — End: 2017-10-11
  Administered 2017-10-07 – 2017-10-11 (×10): 10 mL via INTRAVENOUS

## 2017-10-07 MED ORDER — ASPIRIN 81 MG PO CHEW
81 MG | Freq: Once | ORAL | Status: AC
Start: 2017-10-07 — End: 2017-10-06
  Administered 2017-10-07: 03:00:00 324 mg via ORAL

## 2017-10-07 MED ORDER — TRAMADOL HCL 50 MG PO TABS
50 MG | Freq: Three times a day (TID) | ORAL | Status: DC
Start: 2017-10-07 — End: 2017-10-11
  Administered 2017-10-07 – 2017-10-11 (×10): 50 mg via ORAL

## 2017-10-07 MED ORDER — MAGNESIUM HYDROXIDE 400 MG/5ML PO SUSP
400 MG/5ML | Freq: Every day | ORAL | Status: DC | PRN
Start: 2017-10-07 — End: 2017-10-11

## 2017-10-07 MED ORDER — AMLODIPINE BESYLATE 5 MG PO TABS
5 MG | Freq: Every day | ORAL | Status: DC
Start: 2017-10-07 — End: 2017-10-11
  Administered 2017-10-07 – 2017-10-11 (×5): 5 mg via ORAL

## 2017-10-07 MED ORDER — CLONIDINE HCL 0.1 MG PO TABS
0.1 MG | Freq: Two times a day (BID) | ORAL | Status: DC
Start: 2017-10-07 — End: 2017-10-08
  Administered 2017-10-07 – 2017-10-08 (×2): 0.3 mg via ORAL

## 2017-10-07 MED ORDER — LISINOPRIL 10 MG PO TABS
10 MG | Freq: Every day | ORAL | Status: DC
Start: 2017-10-07 — End: 2017-10-08

## 2017-10-07 MED ORDER — SPIRONOLACTONE 25 MG PO TABS
25 MG | Freq: Every day | ORAL | Status: DC
Start: 2017-10-07 — End: 2017-10-11
  Administered 2017-10-07 – 2017-10-11 (×5): 12.5 mg via ORAL

## 2017-10-07 MED ORDER — ENOXAPARIN SODIUM 40 MG/0.4ML SC SOLN
40 MG/0.4ML | Freq: Every day | SUBCUTANEOUS | Status: DC
Start: 2017-10-07 — End: 2017-10-09
  Administered 2017-10-07 – 2017-10-08 (×2): 40 mg via SUBCUTANEOUS

## 2017-10-07 MED ORDER — NORMAL SALINE FLUSH 0.9 % IV SOLN
0.9 % | INTRAVENOUS | Status: DC | PRN
Start: 2017-10-07 — End: 2017-10-11

## 2017-10-07 MED ORDER — HYDRALAZINE HCL 50 MG PO TABS
50 MG | Freq: Three times a day (TID) | ORAL | Status: DC
Start: 2017-10-07 — End: 2017-10-11
  Administered 2017-10-07 – 2017-10-11 (×11): 50 mg via ORAL

## 2017-10-07 MED ORDER — CARVEDILOL 25 MG PO TABS
25 MG | Freq: Two times a day (BID) | ORAL | Status: DC
Start: 2017-10-07 — End: 2017-10-11
  Administered 2017-10-07 – 2017-10-11 (×8): 25 mg via ORAL

## 2017-10-07 MED ORDER — REGADENOSON 0.4 MG/5ML IV SOLN
0.4 MG/5ML | Freq: Once | INTRAVENOUS | Status: DC | PRN
Start: 2017-10-07 — End: 2017-10-09

## 2017-10-07 MED ORDER — ISOSORBIDE MONONITRATE ER 60 MG PO TB24
60 MG | Freq: Every day | ORAL | Status: DC
Start: 2017-10-07 — End: 2017-10-11
  Administered 2017-10-07 – 2017-10-11 (×5): 60 mg via ORAL

## 2017-10-07 MED ORDER — VITAMIN D 50 MCG (2000 UT) PO TABS
50 MCG (2000 UT) | Freq: Every day | ORAL | Status: DC
Start: 2017-10-07 — End: 2017-10-11
  Administered 2017-10-07 – 2017-10-11 (×5): 2000 [IU] via ORAL

## 2017-10-07 MED FILL — ISOSORBIDE MONONITRATE ER 60 MG PO TB24: 60 mg | ORAL | Qty: 1

## 2017-10-07 MED FILL — HYDRALAZINE HCL 50 MG PO TABS: 50 mg | ORAL | Qty: 1

## 2017-10-07 MED FILL — CLONIDINE HCL 0.1 MG PO TABS: 0.1 mg | ORAL | Qty: 3

## 2017-10-07 MED FILL — CARVEDILOL 25 MG PO TABS: 25 mg | ORAL | Qty: 1

## 2017-10-07 MED FILL — AMLODIPINE BESYLATE 5 MG PO TABS: 5 mg | ORAL | Qty: 1

## 2017-10-07 MED FILL — ASPIRIN 81 MG PO CHEW: 81 MG | ORAL | Qty: 4

## 2017-10-07 MED FILL — ENOXAPARIN SODIUM 40 MG/0.4ML SC SOLN: 40 MG/0.4ML | SUBCUTANEOUS | Qty: 0.4

## 2017-10-07 MED FILL — LISINOPRIL 10 MG PO TABS: 10 mg | ORAL | Qty: 3

## 2017-10-07 MED FILL — VITAMIN D 50 MCG (2000 UT) PO TABS: 50 MCG (2000 UT) | ORAL | Qty: 1

## 2017-10-07 MED FILL — LEXISCAN 0.4 MG/5ML IV SOLN: 0.4 MG/5ML | INTRAVENOUS | Qty: 5

## 2017-10-07 MED FILL — NORMAL SALINE FLUSH 0.9 % IV SOLN: 0.9 % | INTRAVENOUS | Qty: 10

## 2017-10-07 MED FILL — SPIRONOLACTONE 25 MG PO TABS: 25 mg | ORAL | Qty: 1

## 2017-10-07 MED FILL — TRAMADOL HCL 50 MG PO TABS: 50 mg | ORAL | Qty: 1

## 2017-10-07 NOTE — Progress Notes (Signed)
[facility] with [facility] and reconciled meds. Coby Shrewsberry, RN

## 2017-10-07 NOTE — Progress Notes (Signed)
Resting in bed no distress

## 2017-10-07 NOTE — Plan of Care (Signed)
Instructed on pain scale and description of pain  Patient able to communicate level of pain according to pain scale correctly  Patient able to describe pain   No pain this shift  Patient has no pain at this time.

## 2017-10-07 NOTE — Consults (Addendum)
[facility]  CONSULT     Reason for consult: R/O ACS  Chief complaint: chest pain and palpitations   Patient's presented to ER on: 10/06/2017  Assessment and Plan    67 Y/O M with hx of AF (off anticoagulation since 2014) presented to ER wit 4 day history of palpitations associated with chest pain, SOB, sweating, without dizziness. He was found to be Flu A +, nuclear cardiac stress test was ordered by primary team, cardiology was consulted.    1. Chest pain, atypical   2. Palpitations (could be AF)  ?? Echo   ?? Hold on Nuclear cardiac Stress test (given the Flu)  ?? Will need risk re-stratification (checking lipid panel, A1C)   ?? Obtain records from [place]   3. Hx of HFrEF (compensated now)   4. Hx of AF (not in RVR)  CHA2DS2-VASc Score for Atrial Fibrillation Stroke Risk 3   Risk   Factors  Component Value   C CHF Yes 1   H HTN Yes 1   A2 Age >= 75 No,  (67 y.o.) 0   D DM No 0   S2 Prior Stroke/TIA No 0   V Vascular Disease No 0   A Age 41-74 Yes,  (66 y.o.) 1   Sc Sex male 0    CHA2DS2-VASc  Score  3   Score last updated 10/07/17 10:39 AM    ?? C/w coreg 25 BID   ?? Will need to start anticoagulation once previous record obtained   ?? C/w hydralazine 50 TID + IMDUR 60   ?? C/w lisinopril 30   ?? S/w aldactone 12.5 mg daily   ?? Can follow with CHF clinic outpatient    ??   5. Subclinical hypothyroidism     6. Hx of mild DM   ?? Repeat A1C   HPI     68 Y/O M with hx of AF (off anticoagulation since 2014 when he developed retroperitoneal hematoma, at that time he was prescribed Lovenox on top of his warfarin, his INR was 18), presented to ER wit 4 day history of palpitations associated with chest pain, SOB, sweating, without dizziness last about 15 minutes and happens on/off, he has good exercise tolerance 1 mile, 2 flights of stairs. These symptoms happen not related to exercise. It is the first time he's having such symptoms. Denied any orthopnea      He was found to be Flu A +, nuclear cardiac stress test  was ordered by primary team, cardiology was consulted. Pt stated that he was told [facility] His pro-BNP 725, troponin WNL x2. No previous records available, he is on HFrEF therapy (hydralazine+IMDUR, lisinopril, coreg) in addition to amlodipine and clonidine. Last A1C 6.6% in 2014 not on any DM meds.      PMH/PSH/Social/Family/Medications/Allergy         Diagnosis Date   ??? Atrial fibrillation (HCC)    ??? HLD (hyperlipidemia)    ??? Hypertension    ??? Inguinal hernia      No past surgical history on file.    TOBACCO:   reports that he has been smoking cigarettes.  He has a 2.50 pack-year smoking history. He does not have any smokeless tobacco history on file.  ETOH:   reports that he drinks alcohol.  No family history on file.  Prior to Admission medications    Medication Sig Start Date End Date Taking? Authorizing Provider  carvedilol (COREG) 25 MG tablet Take 25 mg by mouth 2 times daily (with meals)   Yes Historical Provider, MD   amLODIPine (NORVASC) 5 MG tablet Take 5 mg by mouth daily   Yes Historical Provider, MD   isosorbide mononitrate (IMDUR) 60 MG CR tablet Take 60 mg by mouth daily.   Yes Historical Provider, MD   lisinopril (PRINIVIL;ZESTRIL) 10 MG tablet Take 40 mg by mouth 2 times daily    Yes Historical Provider, MD   Vitamin D (CHOLECALCIFEROL) 1000 UNITS CAPS capsule Take 2,000 Units by mouth daily.    Historical Provider, MD        Patient has no known allergies.    ROS     ?? Constitutional: No fever, no chills, no change in weight; good appetite  ?? HEENT: No blurred vision, no ear problems, no sore throat, no rhinorrhea.  ?? Respiratory: No cough, no sputum production, + chest pain, no SOB  ?? Cardiology: No angina, no DOE , no paroxysmal nocturnal dyspnea, no orthopnea, ++ palpitation, no leg swelling.   ?? Gastroenterology: No dysphagia, no reflux; no abdominal pain, no nausea or vomiting; no constipation or diarrhea. No hematochezia   ?? Genitourinary: No  dysuria, no frequency, hesitancy; no hematuria  ?? Musculoskeletal: no joint pain, no myalgia, no change in range of movement  ?? Neurology: no focal weakness in extremities, no slurred speech, no double vision, no tingling or numbness sensation  ?? Endocrinology: no temperature intolerance, no polyphagia, polydipsia or polyuria  ?? Hematology: no increased bleeding, no bruising, no lymphadenopathy    Physical exam     ?? Vitals: BP 111/73    Pulse 77    Temp 97.2 ??F (36.2 ??C) (Temporal)    Resp 18    Ht 6' (1.829 m)    Wt 153 lb 2 oz (69.5 kg)    SpO2 98%    BMI 20.77 kg/m??   ?? General Appearance: alert and oriented to person, place and time, well developed and well- nourished, in no acute distress  ?? Skin: warm and dry, no rash or erythema  ?? Neck: supple and non-tender without mass, no thyromegaly or thyroid nodules, no cervical lymphadenopathy  ?? Pulmonary/Chest: no wheezing, + crackles on the left lower base, normal chest rise, no respiratory distress  ?? Cardiovascular: AF, normal rate, normal S1 and S2, no murmurs, rubs, or gallops, distal pulses intact, no carotid bruits, no JVD  ?? Abdomen: soft, non-tender, non-distended, normal bowel sounds, no appreciated masses or organomegaly  ?? Extremities:No edema, no deformity, no clubbing  ?? Neurologic: reflexes normal and symmetric, no cranial nerve deficit, gait, coordination and speech normal     LAB   CBC:   Lab Results   Component Value Date    WBC 6.5 10/07/2017    RBC 5.22 10/07/2017    HGB 16.5 10/07/2017    HCT 49.0 10/07/2017    MCV 93.9 10/07/2017    RDW 13.2 10/07/2017    PLT 148 10/07/2017     CMP:  Lab Results   Component Value Date    NA 134 10/06/2017    K 4.1 10/06/2017    CL 97 10/06/2017    CO2 23 10/06/2017    BUN 15 10/06/2017    PROT 7.7 10/06/2017       Recent imaging results     Xr Chest Standard (2 Vw)   Result Date: 10/06/2017    No active infiltrate or vascular congestion.   EKG  Normal sinus rhythm, @ rate, no ST deviation, no TWI, no ST  depression, QRS, QTc, relatively no significant changes from previous EKGs.             Roslynn Amble, MD, PGY-3   Internal Medicine Resident     Discussed with  Dr. Doyle Askew signed by Kerby Nora, MD at 10/07/2017 5:13 PM   I independently interviewed and examined the patient. I have reviewed the above documentation completed by the medicine resident. Please see my additional contributions to the HPI, physical exam, and assessment / medical decision making.  ??  Reason for consult: Chest pain and palpitations  ??  He was not previously known to [facility] cardiology group.  ??  Mr. Udell is a 67 year old gentleman [facility] [place]. He has hx of AF??(off anticoagulation since 2014 when he developed retroperitoneal hematoma and hematuria, at that time he was prescribed Lovenox on top of his warfarin, his INR was 18). He presented to ER with a 4 day history of??palpitations??associated with chest pain, SOB, sweating, without dizziness last about 15 minutes and happens on/off for several months.   He never passed out with these symptoms. He has good exercise tolerance 1 mile, 2 flights of stairs. These symptoms happen not related to exercise. It is the first time he's having such symptoms.??Denied any orthopnea??  ??   He is also not feeling well for the past 4 days and has a dry cough without expectoration and also sweating. He denies any dyspnea at rest or with exertion, leg edema, PND or any episodes of syncope or presyncope. In the emergency room,  he was found to be Flu A + without any fever. Nuclear cardiac stress test was ordered by primary team, cardiology was consulted.??Pt stated that he was told his LVEF is about 20% about 8 years back([facility]). His pro-BNP 725, troponin WNL x2. No previous records available, he is on HFrEF therapy (hydralazine+IMDUR, lisinopril, coreg) in addition to amlodipine and clonidine. Last A1C 6.6% in 2014 not on any DM  meds.??  ??  ??  Review of Systems:  Cardiac: As per HPI  General: No fever, chills  Pulmonary: As per HPI  GI: No nausea, vomiting  Musculoskeletal: MAE x 4, no focal motor deficits  Skin: Intact, no rashes  Neuro/Psych: No headache or seizures  ??  Physical Exam:  BP 122/68    Pulse 88    Temp 98.8 ??F (37.1 ??C) (Temporal)    Resp 18    Ht 6' (1.829 m)    Wt 153 lb 2 oz (69.5 kg)    SpO2 98%    BMI 20.77 kg/m??   Appearance: Awake, alert, no acute respiratory distress  Skin: Intact, no rash  Head: Normocephalic, atraumatic  ENMT: MMM, no rhinorrhea  Neck: Supple, no carotid bruits  Lungs: Clear to auscultation bilaterally. No wheezes, rales, or rhonchi.  Cardiac: Irregularly irregular rate and rhythm, +S1S2, no murmurs apparent  Abdomen: Soft, +bowel sounds  Extremities: Moves all extremities x 4, no lower extremity edema  Neurologic: No focal motor deficits apparent, normal mood and affect  ??  Telemetry findings reviewed: SR at rate 88 and had one episode of nonsustained VT this afternoon.  ??  EKG: Atrial fibrillation, incomplete right bundle branch block, left anterior fascicular block, LVH, hyperacute T waves changes. Anterior MI age undetermined. Abnormal EKG.  Lab data was reviewed: ProBNP 1189, his total  cholesterol 132, HDL 45, LDL 63, triglycerides 119, Bun/Ceat 20/1.2 and electrolytes normal. Hemoglobin and A1c 6.1, CBC normal.  ??  Assessment:  ?? Permanent atrial fibrillation with controlled rate  ?? CHA2DS2 Vasc score 2  ?? Nonsustained went for tachycardia  ?? Episodes of palpitations most likely related to A. fib with RVR for went for tachycardia  ?? Heart failure with reduced ejection fraction with an EF around 20%  ?? Euvolemic and hemodynamically stable  ?? History of for acute influenza A infection  ?? ??  ??  ??  Plan:   ?? He is not on guideline directed medical therapy for heart failure with reduced ejection fraction. We'll continue Coreg 25 mg twice daily which is at goal doses and also continue lisinopril.  We'll add Aldactone 12.5 mg by mouth daily.  ?? We'll obtain an echocardiogram to assess LV function.  ?? Risks and benefits of anticoagulant therapy were discussed with the patient and is willing to take a newer anticoagulated. We'll start him on Eliquis 5 mg by mouth twice daily.  ?? Will hold ischemic workup at this time since he has underlying influenza and also will obtain his medical records from [facility] to see what kind of ischemic workup he had in the past.  ?? We'll obtain EP consult for ICD evaluation since he has episodes of nonsustained Vt. Will place this after evaluating his LV function.   ?? Monitor his rhythm   ?? Continue rest of his home medications.  ??  Thank you for consulting [place] and we'll be following with you for any further recommendations.  ??  ??  Joya Gaskins, MD, [facility]  [facility] Cardiology

## 2017-10-07 NOTE — Progress Notes (Signed)
Hospitalist Progress Note      PCP: No primary care provider on file.    Date of Admission: 10/06/2017      Subjective:     No cp  No palp  comfortable      Medications:  Reviewed    Infusion Medications   Scheduled Medications   ??? amLODIPine  5 mg Oral Daily   ??? sodium chloride flush  10 mL Intravenous 2 times per day   ??? enoxaparin  40 mg Subcutaneous Daily   ??? carvedilol  25 mg Oral BID WC   ??? cloNIDine  0.3 mg Oral BID   ??? hydrALAZINE  50 mg Oral 3 times per day   ??? isosorbide mononitrate  60 mg Oral Daily   ??? lisinopril  30 mg Oral Daily   ??? traMADol  50 mg Oral TID   ??? vitamin D  2,000 Units Oral Daily     PRN Meds: sodium chloride flush, magnesium hydroxide, ondansetron, regadenoson      Intake/Output Summary (Last 24 hours) at 10/07/2017 0958  Last data filed at 10/07/2017 0537  Gross per 24 hour   Intake 100 ml   Output 200 ml   Net -100 ml       Physical Exam Performed:    BP 135/89    Pulse 81    Temp 98.5 ??F (36.9 ??C) (Temporal)    Resp 18    Ht 6' (1.829 m)    Wt 153 lb 2 oz (69.5 kg)    SpO2 99%    BMI 20.77 kg/m??     General Appearance/Constitutional:????Alert, development consistent with age, NAD.  HEENT:????NC/NT. PERRLA. ??Airway patent.  Neck:????Normal ROM. ??Supple.  Respiratory:????Clear to auscultation and breath sounds equal.  CV:????Regular rate irregular rhythm, normal heart sounds, without pathological murmurs, ectopy, gallops, or rubs.  Chest:????Symmetrical without visible rash or tenderness.  GI: ??Abdomen Soft, nontender, good bowel sounds. ??No firm or pulsatile mass.  Extremities:??No tenderness or edema noted.  Integument: ??Normal turgor. ??Warm, dry, without visible rash, unless noted elsewhere.  Neurological:????Oriented. ??Motor functions intact.   Psychiatric:????Affect normal.          Labs:   Recent Labs     10/06/17  1613 10/07/17  0456   WBC 5.7 6.5   HGB 15.8 16.5   HCT 47.5 49.0   PLT 145 148     Recent Labs     10/06/17  1613   NA 134   K 4.1   CL 97*   CO2 23   BUN 15   CREATININE 1.0    CALCIUM 9.3     Recent Labs     10/06/17  1613   AST 60*   ALT 46*   BILITOT 0.3   ALKPHOS 93     Recent Labs     10/06/17  2055   INR 1.1     Recent Labs     10/06/17  1613 10/07/17  0456   TROPONINI <0.01 <0.01       Urinalysis:      Lab Results   Component Value Date    NITRU Negative 09/14/2014    WBCUA 1-3 09/14/2014    BACTERIA RARE 09/14/2014    RBCUA NONE 09/14/2014    BLOODU Negative 09/14/2014    SPECGRAV >=1.030 09/14/2014    GLUCOSEU Negative 09/14/2014       Radiology:  XR CHEST STANDARD (2 VW)   Final Result   No active infiltrate or vascular congestion.  NM Cardiac Stress Test Nuclear Imaging    (Results Pending)           Assessment/Plan:    Active Hospital Problems    Diagnosis Date Noted   ??? Chest pain [R07.9] 10/06/2017       ??  67 yo male hx afib hlp htn came to ER with palpitations chest pain radiating to left arm, denies fever chills, cough no pnd/orthopnea, chest pain going on 3 days, in ER ekg afib rate controlled, cxr clear trop negative , off coumadin by his noncompliance due to hx bleeding from high INR in past.   ??  ??  AFIB  CHEST PAIN  HLP  HTN  ??  obs  R/o ACS  Cards c/sed  Stress test today  Home meds  Influenza A detected but pt asymptomatic  ??  ??  DVT Prophylaxis: lovenox  Diet: Diet NPO, After Midnight Exceptions are: Sips with Meds  Code Status: Full Code  ??  PT/OT Eval Status: na  ??  Dispo - obs      Janett BillowAhmet Wandalene Abrams, MD

## 2017-10-07 NOTE — H&P (Signed)
Hospital Medicine History & Physical      PCP: No primary care provider on file.    Date of Admission: 10/06/2017    Date of Service: 10-06-17      Chief Complaint:  Chest pain palp      History Of Present Illness:      67 yo male hx afib hlp htn came to ER with palpitations chest pain radiating to left arm, denies fever chills, cough no pnd/orthopnea, chest pain going on 3 days, in ER ekg afib rate controlled, cxr clear trop negative , off coumadin by his noncompliance due to hx bleeding from high INR in past.       Past Medical History:          Diagnosis Date   ??? Atrial fibrillation (HCC)    ??? HLD (hyperlipidemia)    ??? Hypertension    ??? Inguinal hernia        Past Surgical History:      No past surgical history on file.    Medications Prior to Admission:      Prior to Admission medications    Medication Sig Start Date End Date Taking? Authorizing Provider   isosorbide mononitrate (IMDUR) 60 MG CR tablet Take 60 mg by mouth daily.   Yes Historical Provider, MD   lisinopril (PRINIVIL;ZESTRIL) 10 MG tablet Take 30 mg by mouth daily.   Yes Historical Provider, MD   hydrALAZINE (APRESOLINE) 50 MG tablet Take 50 mg by mouth 2 times daily    Yes Historical Provider, MD   cloNIDine (CATAPRES) 0.3 MG tablet Take 1 tablet by mouth 2 times daily. 05/04/13  Yes Wanda Plump, MD   Vitamin D (CHOLECALCIFEROL) 1000 UNITS CAPS capsule Take 2,000 Units by mouth daily.    Historical Provider, MD       Allergies:  Patient has no known allergies.    Social History:      The patient currently lives at home    TOBACCO:   reports that he has been smoking cigarettes.  He has a 2.50 pack-year smoking history. He does not have any smokeless tobacco history on file.  ETOH:   reports that he drinks alcohol.      Family History:       Reviewed in detail and negative for DM, CAD, Cancer, CVA. Positive as follows:    No family history on file.    REVIEW OF SYSTEMS:   Pertinent positives as noted in the HPI. All other systems reviewed and  negative.    PHYSICAL EXAM PERFORMED:    BP (!) 159/105    Pulse 95    Temp 98.4 ??F (36.9 ??C) (Temporal)    Resp 18    Ht 6' (1.829 m)    Wt 154 lb 8 oz (70.1 kg)    SpO2 99%    BMI 20.95 kg/m??     General Appearance/Constitutional:  Alert, development consistent with age, NAD.  HEENT:  NC/NT. PERRLA.  Airway patent.  Neck:  Normal ROM.  Supple.  Respiratory:  Clear to auscultation and breath sounds equal.  CV:  Regular rate irregular rhythm, normal heart sounds, without pathological murmurs, ectopy, gallops, or rubs.  Chest:  Symmetrical without visible rash or tenderness.  GI:  Abdomen Soft, nontender, good bowel sounds.  No firm or pulsatile mass.  Extremities: No tenderness or edema noted.  Integument:  Normal turgor.  Warm, dry, without visible rash, unless noted elsewhere.  Neurological:  Oriented.  Motor functions intact.  Psychiatric:  Affect normal.          Labs:     Recent Labs     10/06/17  1613   WBC 5.7   HGB 15.8   HCT 47.5   PLT 145     Recent Labs     10/06/17  1613   NA 134   K 4.1   CL 97*   CO2 23   BUN 15   CREATININE 1.0   CALCIUM 9.3     Recent Labs     10/06/17  1613   AST 60*   ALT 46*   BILITOT 0.3   ALKPHOS 93     Recent Labs     10/06/17  2055   INR 1.1     Recent Labs     10/06/17  1613   TROPONINI <0.01       Urinalysis:      Lab Results   Component Value Date    NITRU Negative 09/14/2014    WBCUA 1-3 09/14/2014    BACTERIA RARE 09/14/2014    RBCUA NONE 09/14/2014    BLOODU Negative 09/14/2014    SPECGRAV >=1.030 09/14/2014    GLUCOSEU Negative 09/14/2014       Radiology:         XR CHEST STANDARD (2 VW)   Final Result   No active infiltrate or vascular congestion.            NM Cardiac Stress Test Nuclear Imaging    (Results Pending)       ASSESSMENT:    Active Hospital Problems    Diagnosis Date Noted   ??? Chest pain [R07.9] 10/06/2017         67 yo male hx afib hlp htn came to ER with palpitations chest pain radiating to left arm, denies fever chills, cough no pnd/orthopnea, chest  pain going on 3 days, in ER ekg afib rate controlled, cxr clear trop negative , off coumadin by his noncompliance due to hx bleeding from high INR in past.       AFIB  CHEST PAIN  HLP  HTN    obs  R/o ACS  Cards c/s  Stress test in am  Home meds      DVT Prophylaxis: lovenox  Diet: Diet NPO, After Midnight Exceptions are: Sips with Meds  Code Status: Full Code    PT/OT Eval Status: na    Dispo - obs       Nizhoni Parlow, MD    Thank you No primary care provider on file. for the opportunity to be involved in this patient's care. If you have any questions or concerns please feel free to contact me at [phone].

## 2017-10-07 NOTE — Progress Notes (Signed)
[facility] on [address] unable to get that pharmacy to come up

## 2017-10-08 LAB — BASIC METABOLIC PANEL
Anion Gap: 17 mmol/L — ABNORMAL HIGH (ref 7–16)
BUN: 21 mg/dL (ref 8–23)
CO2: 23 mmol/L (ref 22–29)
Calcium: 9.1 mg/dL (ref 8.6–10.2)
Chloride: 101 mmol/L (ref 98–107)
Creatinine: 1 mg/dL (ref 0.7–1.2)
GFR African American: >60
GFR Non-African American: >60 mL/min/{1.73_m2} (ref >=60)
Glucose: 100 mg/dL — ABNORMAL HIGH (ref 74–99)
Potassium: 4.6 mmol/L (ref 3.5–5.0)
Sodium: 141 mmol/L (ref 132–146)

## 2017-10-08 LAB — ECHOCARDIOGRAM COMPLETE 2D W DOPPLER W COLOR: Left Ventricular Ejection Fraction: 43

## 2017-10-08 MED ORDER — CARVEDILOL 25 MG PO TABS
25 MG | Freq: Two times a day (BID) | ORAL | Status: DC
Start: 2017-10-08 — End: 2017-10-08

## 2017-10-08 MED ORDER — LISINOPRIL 20 MG PO TABS
20 MG | Freq: Every day | ORAL | Status: DC
Start: 2017-10-08 — End: 2017-10-11
  Administered 2017-10-09 – 2017-10-11 (×3): 40 mg via ORAL

## 2017-10-08 MED ORDER — NITROGLYCERIN 0.4 MG SL SUBL
0.4 MG | SUBLINGUAL | Status: AC
Start: 2017-10-08 — End: 2017-10-08
  Administered 2017-10-08: 17:00:00 0.4 via SUBLINGUAL

## 2017-10-08 MED FILL — HYDRALAZINE HCL 50 MG PO TABS: 50 mg | ORAL | Qty: 1

## 2017-10-08 MED FILL — CARVEDILOL 25 MG PO TABS: 25 mg | ORAL | Qty: 1

## 2017-10-08 MED FILL — NORMAL SALINE FLUSH 0.9 % IV SOLN: 0.9 % | INTRAVENOUS | Qty: 10

## 2017-10-08 MED FILL — NITROGLYCERIN 0.4 MG SL SUBL: 0.4 mg | SUBLINGUAL | Qty: 25

## 2017-10-08 MED FILL — AMLODIPINE BESYLATE 5 MG PO TABS: 5 mg | ORAL | Qty: 1

## 2017-10-08 MED FILL — VITAMIN D 50 MCG (2000 UT) PO TABS: 50 MCG (2000 UT) | ORAL | Qty: 1

## 2017-10-08 MED FILL — ENOXAPARIN SODIUM 40 MG/0.4ML SC SOLN: 40 MG/0.4ML | SUBCUTANEOUS | Qty: 0.4

## 2017-10-08 MED FILL — SPIRONOLACTONE 25 MG PO TABS: 25 mg | ORAL | Qty: 1

## 2017-10-08 MED FILL — TRAMADOL HCL 50 MG PO TABS: 50 mg | ORAL | Qty: 1

## 2017-10-08 MED FILL — LISINOPRIL 20 MG PO TABS: 20 mg | ORAL | Qty: 2

## 2017-10-08 MED FILL — ISOSORBIDE MONONITRATE ER 60 MG PO TB24: 60 mg | ORAL | Qty: 1

## 2017-10-08 NOTE — Progress Notes (Signed)
[facility]  Cardiology Progress Note     Reason for consult: R/O ACS  Chief complaint: chest pain and palpitations   Patient's presented to ER on: 10/06/2017  Assessment and Plan      67 Y/O M with hx of AF (off anticoagulation since 2014) presented to ER wit 4 day history of palpitations associated with chest pain, SOB, sweating, without dizziness. He was found to be Flu A +, nuclear cardiac stress test was ordered by primary team, cardiology was consulted.  ??  1. Chest pain, atypical   2. Palpitations (could be AF)  ?? Echo is not done yet, based on LVEF will assess if ICD will be required   ?? Hold on Nuclear cardiac Stress test (given the Flu)  ?? Will need risk re-stratification (checking lipid panel, A1C)   ?? Obtain records from [place]   3. Hx of HFrEF (compensated now)   4. Hx of AF (not in RVR)  CHA2DS2-VASc Score for Atrial Fibrillation Stroke Risk 3  ?? Risk   Factors ?? Component Value   C CHF Yes 1   H HTN Yes 1   A2 Age >= 75 No,  (67 y.o.) 0   D DM No 0   S2 Prior Stroke/TIA No 0   V Vascular Disease No 0   A Age 49-74 Yes,  (67 y.o.) 1   Sc Sex male 0   ?? CHA2DS2-VASc  Score ?? 3   Score last updated 10/07/17 10:39 AM  ??  ?? C/w coreg 25 BID   ?? Will need to start anticoagulation once previous record obtained   ?? C/w hydralazine 50 TID + IMDUR 60   ?? C/w lisinopril 30   ?? S/w aldactone 12.5 mg daily   ?? Can follow with CHF clinic outpatient    ?? ??  5. Resistant HTN  ?? C/w hydralazine 50 mg TID  ?? C/w IMDUR 60 mg daily   ?? C/w clonidine 0.3 BID  ?? C/w coreg 25 mg BID  ?? C/w amlodipine 5 mg daily   ?? C/w lisinopril 30 mg daily  ?? C/w aldactone 12.5 mg daily   6. Subclinical hypothyroidism   Subjective    Patient was seen and examined. Feels betters, no chest pain, SOB, fever, cough, nausea, vomiting, abdominal pain, diarrhea, blood in stool, black stool, dizziness, or headache.       Objective    TEMPERATURE:  Current - Temp: 98.9 ??F (37.2 ??C); Max - Temp  Avg: 98.7 ??F (37.1 ??C)  Min: 97.7  ??F (36.5 ??C)  Max: 100.1 ??F (37.8 ??C)  RESPIRATIONS RANGE: Resp  Avg: 17.2  Min: 16  Max: 18  PULSE RANGE: Pulse  Avg: 73.6  Min: 64  Max: 88  BLOOD PRESSURE RANGE:  Systolic (24hrs), Avg:123 , Min:108 , Max:135   ; Diastolic (24hrs), Avg:72, Min:64, Max:84    PULSE OXIMETRY RANGE: SpO2  Avg: 98.5 %  Min: 98 %  Max: 99 %    I & O - 24hr:    Intake/Output Summary (Last 24 hours) at 10/08/2017 1116  Last data filed at 10/07/2017 2053  Gross per 24 hour   Intake 370 ml   Output --   Net 370 ml     I/O last 3 completed shifts:  In: 370 [P.O.:360; I.V.:10]  Out: -  No intake/output data recorded.   Weight change:     Physical exam      ?? General Appearance: alert  and oriented to person, place and time, well developed and well- nourished, in no acute distress  ?? Skin: warm and dry, no rash or erythema  ?? Head: normocephalic and atraumatic  ?? Eyes: pupils equal, round, and reactive to light, extraocular eye movements intact, conjunctivae normal  ?? ENT: tympanic membrane, external ear and ear canal normal bilaterally, nose without deformity, nasal mucosa and turbinates normal without polyps  ?? Neck: supple and non-tender without mass, no thyromegaly or thyroid nodules, no cervical lymphadenopathy  ?? Pulmonary/Chest: clear to auscultation bilaterally- no wheezes, rales or rhonchi, normal air movement, no respiratory distress  ?? Cardiovascular: normal rate, regular rhythm, normal S1 and S2, no murmurs, rubs, clicks, or gallops, distal pulses intact, no carotid bruits  ?? Abdomen: soft, non-tender, non-distended, normal bowel sounds, no masses or organomegaly  ?? Extremities: no cyanosis, clubbing or edema  ?? Musculoskeletal: normal range of motion, no joint swelling, deformity or tenderness  ?? Neurologic: reflexes normal and symmetric, no cranial nerve deficit, gait, coordination and speech normal     Medications   Continuous Infusions:  Scheduled Meds:  ??? amLODIPine  5 mg Oral Daily   ??? spironolactone  12.5 mg Oral Daily   ???  sodium chloride flush  10 mL Intravenous 2 times per day   ??? enoxaparin  40 mg Subcutaneous Daily   ??? carvedilol  25 mg Oral BID WC   ??? cloNIDine  0.3 mg Oral BID   ??? hydrALAZINE  50 mg Oral 3 times per day   ??? isosorbide mononitrate  60 mg Oral Daily   ??? lisinopril  30 mg Oral Daily   ??? traMADol  50 mg Oral TID   ??? vitamin D  2,000 Units Oral Daily     PRN Meds: sodium chloride flush, magnesium hydroxide, ondansetron, regadenoson    Cultures    Blood cultures No results found for: BC  Respiratory cultures No results found for: RESPCULTURE No results found for: LABGRAM  Urine   Urine Culture, Routine   Date Value Ref Range Status   05/17/2013 <10,000 CFU/mL  Gram positive cocci    Final     Legionella No results found for: LABLEGI  C Diff PCR No results found for: CDIFPCR  Wound culture/abscess: No results for input(s): WNDABS in the last 72 hours.  Tip culture:No results for input(s): CXCATHTIP in the last 72 hours.    LAB & Imaging      Basic Labs  CBC:   Lab Results   Component Value Date    WBC 6.5 10/07/2017    RBC 5.22 10/07/2017    HGB 16.5 10/07/2017    HCT 49.0 10/07/2017    MCV 93.9 10/07/2017    RDW 13.2 10/07/2017    PLT 148 10/07/2017     CMP:  Lab Results   Component Value Date    NA 137 10/07/2017    K 4.4 10/07/2017    CL 100 10/07/2017    CO2 21 10/07/2017    BUN 20 10/07/2017    PROT 7.7 10/06/2017     U/A:  No components found for: UCOLOR, [place], USPGRAV, UPH, UPROTEIN, UGLUCOSE, UKETONE, UBILI, UBLOOD, [place], [place], [place], [place], [place], [place], [place], [place], [place]  TSH:    Lab Results   Component Value Date    TSH 7.110 10/06/2017     PT/INR:  No results found for: PTINR  PTT:    Lab Results   Component Value Date    APTT 31.2 10/06/2017  HgBA1c:  No components found for: HGBA1C      Imaging Studies:     Xr Chest Standard (2 Vw)   Result Date: 10/06/2017  No active infiltrate or vascular congestion.     Xr Chest Portable   Result Date: 10/07/2017   No active process and no  interim change           Roslynn AmbleMatar Deshanta Lady, MD, PGY-III  Internal Medicine Resident         Attending Dr. Betti Cruzeddy

## 2017-10-08 NOTE — Plan of Care (Signed)
Instructed on pain scale and description of pain  Patient able to communicate level of pain according to pain scale correctly  Patient able to describe pain   No pain this shift  Patient has no pain at this time.

## 2017-10-08 NOTE — Progress Notes (Signed)
Nutrition Assessment    Type and Reason for Visit: Initial, Positive Nutrition Screen    Nutrition Recommendations: Continue current diet.  Add ONS BID.    Nutrition Assessment: Pt appears malnourished on admission, subcutaneous fat and muscle wasting, decreased appetite pta, weight loss pta, unable to assess weight changes, no weight history. Pt ate > 75% x 2 days.  Willing to consume ONS BID.    Malnutrition Assessment:  ?? Malnutrition Status: Insufficient data  ?? Context: Acute illness or injury  ?? Findings of the 6 clinical characteristics of malnutrition (Minimum of 2 out of 6 clinical characteristics is required to make the diagnosis of moderate or severe Protein Calorie Malnutrition based on AND/ASPEN Guidelines):  1. Energy Intake-(> 75%), (2 days)    2. Weight Loss-Unable to assess, unable to assess  3. Fat Loss-Mild subcutaneous fat loss, Orbital  4. Muscle Loss-Mild muscle mass loss, Calf (gastrocnemius), Clavicles (pectoralis and deltoids)  5. Fluid Accumulation-No significant fluid accumulation,    6. Grip Strength-Not measured    Nutrition Risk Level: Moderate    Nutrient Needs:  ?? Estimated Daily Total Kcal: 1775 - 2100(CBW 25-30 kcals/kg)  ?? Estimated Daily Protein (g): 85-100(CBW 1.2 -1.4 g/kg)  ?? Estimated Daily Total Fluid (ml/day): (1 ml/kcal)    Nutrition Diagnosis:   ?? Problem: Inadequate oral intake  ?? Etiology: related to Psychological cause/life stress    ??? Signs and symptoms:  as evidenced by Diet history of poor intake, Mild muscle loss, Mild loss of subcutaneous fat    Objective Information:  ?? Nutrition-Focused Physical Findings: alert, no edema noted, N/V, poor appetite pta, + I/O, hx: AFib, +I/O  ?? Wound Type: None  ?? Current Nutrition Therapies:  ?? Oral Diet Orders: Cardiac   ?? Oral Diet intake: 76-100%  ?? Anthropometric Measures:  ?? Ht: 6\' 4"  (193 cm)   ?? Current Body Wt: 157 lb (71.2 kg)(3/15 standing scale)  ?? Admission Body Wt: 154 lb (69.9 kg)(3/13 first actual)  ?? Usual Body  Wt: 194 lb (88 kg)(per discussion with pt, last adm 2016, 180#)  ?? % Weight Change:  ,  unable to accurately assess weight changes,  no recent weight history  ?? Ideal Body Wt: 202 lb (91.6 kg), % Ideal Body 78%  ?? BMI Classification: BMI 18.5 - 24.9 Normal Weight    Nutrition Interventions:   Continue current diet, Start ONS(Ensure BID)  Continued Inpatient Monitoring, Education Completed, Coordination of Care    Nutrition Evaluation:   ?? Evaluation: Goals set   ?? Goals:      ?? Monitoring: Meal Intake, Supplement Intake, Diet Tolerance, I&O, Weight, Pertinent Labs, Patient/Family Education, Monitor Hemodynamic Status      Electronically signed by Caryl Asp, RD, LD on 10/08/17 at 3:54 PM    Contact Number:  Ext [phone]

## 2017-10-08 NOTE — Progress Notes (Addendum)
Hospitalist Progress Note      PCP: No primary care provider on file.    Date of Admission: 10/06/2017      Subjective:     No cp  No palp  Comfortable  Feels good  No flu symptoms  He is willing for ICD, NOAC etc      Medications:  Reviewed    Infusion Medications   Scheduled Medications   ??? amLODIPine  5 mg Oral Daily   ??? spironolactone  12.5 mg Oral Daily   ??? sodium chloride flush  10 mL Intravenous 2 times per day   ??? enoxaparin  40 mg Subcutaneous Daily   ??? carvedilol  25 mg Oral BID WC   ??? cloNIDine  0.3 mg Oral BID   ??? hydrALAZINE  50 mg Oral 3 times per day   ??? isosorbide mononitrate  60 mg Oral Daily   ??? lisinopril  30 mg Oral Daily   ??? traMADol  50 mg Oral TID   ??? vitamin D  2,000 Units Oral Daily     PRN Meds: sodium chloride flush, magnesium hydroxide, ondansetron, regadenoson      Intake/Output Summary (Last 24 hours) at 10/08/2017 1124  Last data filed at 10/07/2017 2053  Gross per 24 hour   Intake 370 ml   Output --   Net 370 ml       Physical Exam Performed:    BP 132/78    Pulse 77    Temp 98.9 ??F (37.2 ??C) (Temporal)    Resp 18    Ht 6\' 4"  (1.93 m)    Wt 157 lb 5 oz (71.4 kg)    SpO2 99%    BMI 19.15 kg/m??     General Appearance/Constitutional:????Alert, development consistent with age, NAD.  HEENT:????NC/NT. PERRLA. ??Airway patent.  Neck:????Normal ROM. ??Supple.  Respiratory:????Clear to auscultation and breath sounds equal.  CV:????Regular rate irregular rhythm, normal heart sounds, without pathological murmurs, ectopy, gallops, or rubs.  Chest:????Symmetrical without visible rash or tenderness.  GI: ??Abdomen Soft, nontender, good bowel sounds. ??No firm or pulsatile mass.  Extremities:??No tenderness or edema noted.  Integument: ??Normal turgor. ??Warm, dry, without visible rash, unless noted elsewhere.  Neurological:????Oriented. ??Motor functions intact.   Psychiatric:????Affect normal.          Labs:   Recent Labs     10/06/17  1613 10/07/17  0456   WBC 5.7 6.5   HGB 15.8 16.5   HCT 47.5 49.0   PLT 145 148      Recent Labs     10/06/17  1613 10/07/17  0456   NA 134 137   K 4.1 4.4   CL 97* 100   CO2 23 21*   BUN 15 20   CREATININE 1.0 1.2   CALCIUM 9.3 9.6     Recent Labs     10/06/17  1613   AST 60*   ALT 46*   BILITOT 0.3   ALKPHOS 93     Recent Labs     10/06/17  2055   INR 1.1     Recent Labs     10/06/17  1613 10/07/17  0456   TROPONINI <0.01 <0.01   <0.01       Urinalysis:      Lab Results   Component Value Date    NITRU Negative 09/14/2014    WBCUA 1-3 09/14/2014    BACTERIA RARE 09/14/2014    RBCUA NONE 09/14/2014    BLOODU Negative 09/14/2014  SPECGRAV >=1.030 09/14/2014    GLUCOSEU Negative 09/14/2014       Radiology:  XR CHEST PORTABLE   Final Result   No active process and no interim change         XR CHEST STANDARD (2 VW)   Final Result   No active infiltrate or vascular congestion.                    Assessment/Plan:    Active Hospital Problems    Diagnosis Date Noted   ??? Chest pain [R07.9] 10/06/2017       ??  67 yo male hx afib hlp htn came to ER with palpitations chest pain radiating to left arm, denies fever chills, cough no pnd/orthopnea, chest pain going on 3 days, in ER ekg afib rate controlled, cxr clear trop negative , off coumadin by his noncompliance due to hx bleeding from high INR in past.   ??  ??  AFIB rate controlled  CHEST PAIN  HLP  HTN  CMP with NSVT  Influenza A- infection, late course doing well  ??  obs  R/o ACS  Cards c/sed-- hold off ischemic study, has hx CMP EF 20% per hx, awaiting [facility] records,   CMP- add aldactone, on coreg/acei  Home meds  Influenza A detected but pt asymptomatic  EP c/sed for AICD  To start eliquis per cards pending EP recs  For echo today??  ??  DVT Prophylaxis: lovenox  Diet: Diet NPO, After Midnight Exceptions are: Sips with Meds  Code Status: Full Code  ??  PT/OT Eval Status: na  ??  Dispo - obs      Janett BillowAhmet Meleah Demeyer, MD

## 2017-10-08 NOTE — Plan of Care (Signed)
Problem: Inadequate oral food/beverage intake (NI-2.1)  Goal: Food and/or Nutrient Delivery  Description Add ONS BID.  Individualized approach for food/nutrient provision.  Outcome: Met This Shift

## 2017-10-08 NOTE — Care Coordination-Inpatient (Signed)
CM met with patient in room. Patient states he follows with Dr. Lewanda Rife at the [facility] and he fills his meds at the [facility]. He stated that the [place] did not send him to the ER.   Patient lives alone in a 2 story home with bed and bath on the second floor. Patient does not feel he will have any needs upon discharge.   Bartholomew Boards, RN

## 2017-10-08 NOTE — Progress Notes (Signed)
Nutrition Education    Type and Reason for Visit: Education    ?? Verbally reviewed following information with patient.  ?? Written educational materials provided cardiac diet.  ?? Contact name and number provided.  ?? Refer to Patient Education activity for more details.    Electronically signed by Caryl AspKaren Harmoni Lucus, RD, LD on 10/08/17 at 3:57 PM    Contact Number:  Ext [phone]

## 2017-10-09 ENCOUNTER — Other Ambulatory Visit: Payer: Self-pay | Admitting: Internal Medicine

## 2017-10-09 DIAGNOSIS — N2581 Secondary hyperparathyroidism of renal origin: Secondary | ICD-10-CM | POA: Diagnosis not present

## 2017-10-09 DIAGNOSIS — D631 Anemia in chronic kidney disease: Secondary | ICD-10-CM | POA: Diagnosis not present

## 2017-10-09 DIAGNOSIS — N186 End stage renal disease: Secondary | ICD-10-CM | POA: Diagnosis not present

## 2017-10-09 DIAGNOSIS — D509 Iron deficiency anemia, unspecified: Secondary | ICD-10-CM | POA: Diagnosis not present

## 2017-10-09 LAB — BASIC METABOLIC PANEL
Anion Gap: 12 mmol/L (ref 7–16)
BUN: 16 mg/dL (ref 8–23)
CO2: 24 mmol/L (ref 22–29)
Calcium: 8.9 mg/dL (ref 8.6–10.2)
Chloride: 100 mmol/L (ref 98–107)
Creatinine: 0.8 mg/dL (ref 0.7–1.2)
GFR African American: >60
GFR Non-African American: >60 mL/min/{1.73_m2} (ref >=60)
Glucose: 110 mg/dL — ABNORMAL HIGH (ref 74–99)
Potassium: 4.2 mmol/L (ref 3.5–5.0)
Sodium: 136 mmol/L (ref 132–146)

## 2017-10-09 LAB — EKG 12-LEAD
Atrial Rate: 64 {beats}/min
Q-T Interval: 406 ms
QRS Duration: 114 ms
QTc Calculation (Bazett): 450 ms
R Axis: 50 degrees
T Axis: 18 degrees
Ventricular Rate: 74 {beats}/min

## 2017-10-09 LAB — MAGNESIUM: Magnesium: 2 mg/dL (ref 1.6–2.6)

## 2017-10-09 MED ORDER — NITROGLYCERIN 0.4 MG SL SUBL
0.4 MG | SUBLINGUAL | Status: AC
Start: 2017-10-09 — End: 2017-10-09
  Administered 2017-10-09 (×2)

## 2017-10-09 MED ORDER — REGADENOSON 0.4 MG/5ML IV SOLN
0.4 MG/5ML | Freq: Once | INTRAVENOUS | Status: AC | PRN
Start: 2017-10-09 — End: 2017-10-10
  Administered 2017-10-10: 13:00:00 0.4 mg via INTRAVENOUS

## 2017-10-09 MED ORDER — APIXABAN 5 MG PO TABS
5 MG | Freq: Two times a day (BID) | ORAL | Status: DC
Start: 2017-10-09 — End: 2017-10-11
  Administered 2017-10-09 – 2017-10-11 (×5): 5 mg via ORAL

## 2017-10-09 MED FILL — NITROGLYCERIN 0.4 MG SL SUBL: 0.4 mg | SUBLINGUAL | Qty: 25

## 2017-10-09 MED FILL — HYDRALAZINE HCL 50 MG PO TABS: 50 mg | ORAL | Qty: 1

## 2017-10-09 MED FILL — TRAMADOL HCL 50 MG PO TABS: 50 mg | ORAL | Qty: 1

## 2017-10-09 MED FILL — ISOSORBIDE MONONITRATE ER 60 MG PO TB24: 60 mg | ORAL | Qty: 1

## 2017-10-09 MED FILL — NORMAL SALINE FLUSH 0.9 % IV SOLN: 0.9 % | INTRAVENOUS | Qty: 10

## 2017-10-09 MED FILL — VITAMIN D 50 MCG (2000 UT) PO TABS: 50 MCG (2000 UT) | ORAL | Qty: 1

## 2017-10-09 MED FILL — SPIRONOLACTONE 25 MG PO TABS: 25 mg | ORAL | Qty: 1

## 2017-10-09 MED FILL — LEXISCAN 0.4 MG/5ML IV SOLN: 0.4 MG/5ML | INTRAVENOUS | Qty: 5

## 2017-10-09 MED FILL — CARVEDILOL 25 MG PO TABS: 25 mg | ORAL | Qty: 1

## 2017-10-09 MED FILL — LISINOPRIL 20 MG PO TABS: 20 mg | ORAL | Qty: 2

## 2017-10-09 MED FILL — ELIQUIS 5 MG PO TABS: 5 mg | ORAL | Qty: 1

## 2017-10-09 MED FILL — AMLODIPINE BESYLATE 5 MG PO TABS: 5 mg | ORAL | Qty: 1

## 2017-10-09 NOTE — Progress Notes (Signed)
Hospitalist Progress Note    CC: Palpitations (Patient feels heart racing and as if he is SOB)      Hospital course:    -- Admitted for palpitations / Chest Pain X 3 Days  -- Flu (+)   -- Hx of Cardiomyopathy (? Tachycardia Associated vs Ischemic)   -- Had Hx of Chronic Systolic Heart Failure with Reduced Ejection Fraction, Now Recovered to 40-45%  -- Planned for Stress Testing in the AM       Admit date: 10/06/2017  Days in hospital:  1    24 Hour Events:   -- See above     Subjective:   Patient seen and examined, chart reviewed  -- Single episode of CP this AM, Relieved with NTG X 1  -- No other complaints or concerns.     ROS:   Pertinent items are noted in HPI.     Objective:    BP 137/79    Pulse 83    Temp 99.3 ??F (37.4 ??C) (Temporal)    Resp 22    Ht 6\' 4"  (1.93 m)    Wt 156 lb 6.4 oz (70.9 kg)    SpO2 97%    BMI 19.04 kg/m??     Gen: Awake, Alert, NAD  HEENT: NC/AT, moist mucous membranes, no oropharyngeal erythema or exudate  Neck: supple, trachea midline, no anterior cervical or SC LAD  Heart:  Normal s1/s2, RRR, no murmurs, gallops, or rubs. no leg edema  Lungs:  Clear to ausculation  bilaterally, no wheeze, no rales, no rhonchi, no crackles, no use of accessory muscles  Abd: bowel sounds present, soft, nontender, nondistended, no masses  Extrem:  No clubbing, cyanosis,  no edema  Skin: no rashes or lesions  Psych: A & O x3  Neuro: grossly intact, moves all four extremities.       Assessment:    Active Problems:    Chest pain    Cardiomyopathy [facility])  Resolved Problems:    * No resolved hospital problems. *      Plan:  Planned for Nuclear Stress Testing in the AM   -- NPO Midnight for this     Heart Failure with Recovered Ejection Fraction   -- NYHA Class II  -- Currently EuVolemic, tolerating medication s    Atrial Fibrilation   CHA2DS2-VASc Score for Atrial Fibrillation Stroke Risk   Risk   Factors  Component Value   C CHF No 0   H HTN No 0   A2 Age >= 75  No,  (67 y.o.) 0   D DM No 0   S2 Prior Stroke/TIA No 0   V Vascular Disease No 0   A Age 6-74 Yes,  (67 y.o.) 1   Sc Sex male 0    CHA2DS2-VASc  Score  1   Score last updated 10/09/17 6:57 PM  -- on NOAC at this time     Prognosis:  Fair    Code status:  Full     DVT prophylaxis: []  Lovenox  []  SQ Heparin  []  SCDs  [x]  warfarin/oral direct thrombin inhibitor []  Encourage ambulation  GI prophylaxis: []  PPI/H2blocker  []  not indicated  Diet:  DIET CARDIAC;  Dietary Nutrition Supplements: Standard High Calorie Oral Supplement  Diet NPO, After Midnight Exceptions are: Sips with Meds  Diet NPO, After Midnight Exceptions are: Sips with Meds    Disposition:  []  Home  []  Home with home health []  Rehab []  Psych []  SNF  []   LTAC  []  Long term nursing home or group home []  Transfer to ICU  []  Transfer to PCU []  Other:     Medications:  Scheduled Meds:  ??? apixaban  5 mg Oral BID   ??? lisinopril  40 mg Oral Daily   ??? amLODIPine  5 mg Oral Daily   ??? spironolactone  12.5 mg Oral Daily   ??? sodium chloride flush  10 mL Intravenous 2 times per day   ??? carvedilol  25 mg Oral BID WC   ??? hydrALAZINE  50 mg Oral 3 times per day   ??? isosorbide mononitrate  60 mg Oral Daily   ??? traMADol  50 mg Oral TID   ??? vitamin D  2,000 Units Oral Daily       PRN Meds:  regadenoson, sodium chloride flush, magnesium hydroxide, ondansetron    IV:        Intake/Output Summary (Last 24 hours) at 10/09/2017 1856  Last data filed at 10/09/2017 [id number]  Gross per 24 hour   Intake --   Output 5 ml   Net -5 ml       Results:  CBC:   Recent Labs     10/07/17  0456   WBC 6.5   HGB 16.5   HCT 49.0   MCV 93.9   PLT 148     BMP:   Recent Labs     10/07/17  0456 10/08/17  1548 10/09/17  0412   NA 137 141 136   K 4.4 4.6 4.2   CL 100 101 100   CO2 21* 23 24   BUN 20 21 16    CREATININE 1.2 1.0 0.8     Mag: No results for input(s): MAG in the last 72 hours.  Phos: No results found for: PHOS  No components found for: GLU    LIVER PROFILE:   No results for input(s): AST, ALT,  LIPASE, BILIDIR, BILITOT, ALKPHOS in the last 72 hours.    Invalid input(s):  AMYLASE,  ALB  PT/INR:   Recent Labs     10/06/17  2055   PROTIME 12.7*   INR 1.1     APTT:   Recent Labs     10/06/17  2055   APTT 31.2     UA:No results for input(s): NITRITE, COLORU, PHUR, LABCAST, WBCUA, RBCUA, MUCUS, TRICHOMONAS, YEAST, BACTERIA, CLARITYU, SPECGRAV, LEUKOCYTESUR, UROBILINOGEN, BILIRUBINUR, BLOODU, GLUCOSEU, AMORPHOUS in the last 72 hours.    Invalid input(s): KETONESU    Invalid input(s): ABG  Lab Results   Component Value Date    CALCIUM 8.9 10/09/2017                   Electronically signed by Daun PeacockJeremy Lorin Gawron, DO on 10/09/2017 at 6:56 PM

## 2017-10-09 NOTE — Progress Notes (Signed)
EF increased to 40-45% by echo  Will not proceed directly to cath  Will restart NOAC immediately   lexiscan tmrw.

## 2017-10-10 ENCOUNTER — Inpatient Hospital Stay: Admit: 2017-10-10 | Payer: MEDICARE

## 2017-10-10 LAB — BASIC METABOLIC PANEL
Anion Gap: 13 mmol/L (ref 7–16)
BUN: 15 mg/dL (ref 8–23)
CO2: 19 mmol/L — ABNORMAL LOW (ref 22–29)
Calcium: 8.2 mg/dL — ABNORMAL LOW (ref 8.6–10.2)
Chloride: 101 mmol/L (ref 98–107)
Creatinine: 0.8 mg/dL (ref 0.7–1.2)
GFR African American: >60
GFR Non-African American: >60 mL/min/{1.73_m2} (ref >=60)
Glucose: 121 mg/dL — ABNORMAL HIGH (ref 74–99)
Potassium: 4.4 mmol/L (ref 3.5–5.0)
Sodium: 133 mmol/L (ref 132–146)

## 2017-10-10 LAB — NM MYOCARDIAL SPECT REST EXERCISE OR RX: Left Ventricular Ejection Fraction: 1

## 2017-10-10 LAB — EKG 12-LEAD
Atrial Rate: 84 {beats}/min
Q-T Interval: 416 ms
QRS Duration: 110 ms
QTc Calculation (Bazett): 461 ms
R Axis: 71 degrees
T Axis: 30 degrees
Ventricular Rate: 74 {beats}/min

## 2017-10-10 LAB — MAGNESIUM: Magnesium: 1.8 mg/dL (ref 1.6–2.6)

## 2017-10-10 MED ORDER — TECHNETIUM TC 99M SESTAMIBI - CARDIOLITE
Freq: Once | Status: AC | PRN
Start: 2017-10-10 — End: 2017-10-10
  Administered 2017-10-10: 12:00:00 12 via INTRAVENOUS

## 2017-10-10 MED ORDER — TECHNETIUM TC 99M SESTAMIBI - CARDIOLITE
Freq: Once | Status: AC | PRN
Start: 2017-10-10 — End: 2017-10-10
  Administered 2017-10-10: 13:00:00 34 via INTRAVENOUS

## 2017-10-10 MED FILL — HYDRALAZINE HCL 50 MG PO TABS: 50 mg | ORAL | Qty: 1

## 2017-10-10 MED FILL — ELIQUIS 5 MG PO TABS: 5 mg | ORAL | Qty: 1

## 2017-10-10 MED FILL — VITAMIN D 50 MCG (2000 UT) PO TABS: 50 MCG (2000 UT) | ORAL | Qty: 1

## 2017-10-10 MED FILL — NORMAL SALINE FLUSH 0.9 % IV SOLN: 0.9 % | INTRAVENOUS | Qty: 10

## 2017-10-10 MED FILL — ISOSORBIDE MONONITRATE ER 60 MG PO TB24: 60 mg | ORAL | Qty: 1

## 2017-10-10 MED FILL — CARVEDILOL 25 MG PO TABS: 25 mg | ORAL | Qty: 1

## 2017-10-10 MED FILL — SPIRONOLACTONE 25 MG PO TABS: 25 mg | ORAL | Qty: 1

## 2017-10-10 MED FILL — TRAMADOL HCL 50 MG PO TABS: 50 mg | ORAL | Qty: 1

## 2017-10-10 MED FILL — AMLODIPINE BESYLATE 5 MG PO TABS: 5 mg | ORAL | Qty: 1

## 2017-10-10 MED FILL — LISINOPRIL 20 MG PO TABS: 20 mg | ORAL | Qty: 2

## 2017-10-10 MED FILL — NORMAL SALINE FLUSH 0.9 % IV SOLN: 0.9 % | INTRAVENOUS | Qty: 20

## 2017-10-10 NOTE — Progress Notes (Signed)
To stress lab

## 2017-10-10 NOTE — Progress Notes (Signed)
Hospitalist Progress Note    CC: Palpitations (Patient feels heart racing and as if he is SOB)      Hospital course:    -- Admitted for palpitations / Chest Pain X 3 Days  -- Flu (+)   -- Hx of Cardiomyopathy (? Tachycardia Associated vs Ischemic)   -- Had Hx of Chronic Systolic Heart Failure with Reduced Ejection Fraction, Now Recovered to 40-45%  -- Stress Testing performed on 3/17 and found to be negative for reversible defects. Has a large fixed defect      Admit date: 10/06/2017  Days in hospital:  2    24 Hour Events:   -- See above     Subjective:   Patient seen and examined, chart reviewed  -- Stress test performed today  -- Feels tired and lethargic  -- No other complaints or concerns.     ROS:   Pertinent items are noted in HPI.     Objective:    BP 130/70    Pulse 77    Temp 97.8 ??F (36.6 ??C)    Resp 16    Ht 6\' 4"  (1.93 m)    Wt 156 lb 6.4 oz (70.9 kg)    SpO2 97%    BMI 19.04 kg/m??     Gen: Awake, Alert, NAD  HEENT: NC/AT, moist mucous membranes, no oropharyngeal erythema or exudate  Neck: supple, trachea midline, no anterior cervical or SC LAD  Heart:  Normal s1/s2, RRR, no murmurs, gallops, or rubs. no leg edema  Lungs:  Clear to ausculation  bilaterally, no wheeze, no rales, no rhonchi, no crackles, no use of accessory muscles  Abd: bowel sounds present, soft, nontender, nondistended, no masses  Extrem:  No clubbing, cyanosis,  no edema  Skin: no rashes or lesions  Psych: A & O x3  Neuro: grossly intact, moves all four extremities.       Assessment:    Active Problems:    Chest pain    Cardiomyopathy [facility])  Resolved Problems:    * No resolved hospital problems. *      Plan:  Planned for Nuclear Stress Testing in the AM   -- Performed today and found to be negative    Heart Failure with Recovered Ejection Fraction   -- NYHA Class II  -- Currently EuVolemic, tolerating medication s    Atrial Fibrilation   CHA2DS2-VASc Score for Atrial Fibrillation  Stroke Risk   Risk   Factors  Component Value   C CHF No 0   H HTN No 0   A2 Age >= 75 No,  (67 y.o.) 0   D DM No 0   S2 Prior Stroke/TIA No 0   V Vascular Disease No 0   A Age 84-74 Yes,  (67 y.o.) 1   Sc Sex male 0    CHA2DS2-VASc  Score  1   Score last updated 10/09/17 6:57 PM  -- on NOAC at this time     Influenza A positive  -- Continue with supportive care     Prognosis:  Fair    Code status:  Full     DVT prophylaxis: []  Lovenox  []  SQ Heparin  []  SCDs  [x]  warfarin/oral direct thrombin inhibitor []  Encourage ambulation  GI prophylaxis: []  PPI/H2blocker  []  not indicated  Diet:  Dietary Nutrition Supplements: Standard High Calorie Oral Supplement  DIET CARDIAC;    Disposition:  []  Home  []  Home with home health []  Rehab []  Psych []   SNF  []  LTAC  []  Long term nursing home or group home []  Transfer to ICU  []  Transfer to PCU []  Other:     Medications:  Scheduled Meds:  ??? apixaban  5 mg Oral BID   ??? lisinopril  40 mg Oral Daily   ??? amLODIPine  5 mg Oral Daily   ??? spironolactone  12.5 mg Oral Daily   ??? sodium chloride flush  10 mL Intravenous 2 times per day   ??? carvedilol  25 mg Oral BID WC   ??? hydrALAZINE  50 mg Oral 3 times per day   ??? isosorbide mononitrate  60 mg Oral Daily   ??? traMADol  50 mg Oral TID   ??? vitamin D  2,000 Units Oral Daily       PRN Meds:  sodium chloride flush, magnesium hydroxide, ondansetron    IV:      No intake or output data in the 24 hours ending 10/10/17 1146    Results:  CBC:   No results for input(s): WBC, HGB, HCT, MCV, PLT in the last 72 hours.  BMP:   Recent Labs     10/08/17  1548 10/09/17  0412 10/10/17  0343   NA 141 136 133   K 4.6 4.2 4.4   CL 101 100 101   CO2 23 24 19*   BUN 21 16 15    CREATININE 1.0 0.8 0.8     Mag: No results for input(s): MAG in the last 72 hours.  Phos: No results found for: PHOS  No components found for: GLU    LIVER PROFILE:   No results for input(s): AST, ALT, LIPASE, BILIDIR, BILITOT, ALKPHOS in the last 72 hours.    Invalid input(s):  AMYLASE,   ALB  PT/INR:   No results for input(s): PROTIME, INR in the last 72 hours.  APTT:   No results for input(s): APTT in the last 72 hours.  UA:No results for input(s): NITRITE, COLORU, PHUR, LABCAST, WBCUA, RBCUA, MUCUS, TRICHOMONAS, YEAST, BACTERIA, CLARITYU, SPECGRAV, LEUKOCYTESUR, UROBILINOGEN, BILIRUBINUR, BLOODU, GLUCOSEU, AMORPHOUS in the last 72 hours.    Invalid input(s): KETONESU    Invalid input(s): ABG  Lab Results   Component Value Date    CALCIUM 8.2 (L) 10/10/2017                   Electronically signed by Moody Bruinserek Jorgia Manthei, DO on 10/10/2017 at 11:46 AM

## 2017-10-10 NOTE — Procedures (Signed)
Lexiscan Nuclear Stress Test:    Cardiologist: Dr. Amenda Duclos E Jahzir Strohmeier    Date: 10/10/2017    Indications for study: Chest pain    1. No chest pain  2. No new arrhythmias  3. No EKG changes suggestive of stress induced ischemia  4. Nuclear images pending

## 2017-10-10 NOTE — Progress Notes (Signed)
Report called to Dylan Beasley

## 2017-10-10 NOTE — Progress Notes (Signed)
PROGRESS NOTE     CARDIOLOGY    Chief complaint: Seen today for follow up, management & recommendations for afib, reduced EF  He denies chest pain or shortness of breath today.    Wt Readings from Last 3 Encounters:   10/09/17 156 lb 6.4 oz (70.9 kg)   09/14/14 180 lb (81.6 kg)     Temp Readings from Last 3 Encounters:   10/10/17 98.3 ??F (36.8 ??C)   09/14/14 97.6 ??F (36.4 ??C) (Temporal)     BP Readings from Last 3 Encounters:   10/10/17 118/60   09/14/14 (!) 139/94     Pulse Readings from Last 3 Encounters:   10/10/17 76   09/14/14 60         Recent Labs     10/08/17  1548 10/09/17  0412 10/10/17  0343   NA 141 136 133   K 4.6 4.2 4.4   CL 101 100 101   CO2 23 24 19*   BUN 21 16 15    CREATININE 1.0 0.8 0.8   MG  --  2.0 1.8           regadenoson (LEXISCAN) injection 0.4 mg ONCE PRN   apixaban (ELIQUIS) tablet 5 mg BID   lisinopril (PRINIVIL;ZESTRIL) tablet 40 mg Daily   amLODIPine (NORVASC) tablet 5 mg Daily   spironolactone (ALDACTONE) tablet 12.5 mg Daily   sodium chloride flush 0.9 % injection 10 mL 2 times per day   sodium chloride flush 0.9 % injection 10 mL PRN   magnesium hydroxide (MILK OF MAGNESIA) 400 MG/5ML suspension 30 mL Daily PRN   ondansetron (ZOFRAN) injection 4 mg Q6H PRN   carvedilol (COREG) tablet 25 mg BID WC   hydrALAZINE (APRESOLINE) tablet 50 mg 3 times per day   isosorbide mononitrate (IMDUR) extended release tablet 60 mg Daily   traMADol (ULTRAM) tablet 50 mg TID   vitamin D tablet 2,000 Units Daily       Review of systems:     Heart: as above   Lungs: as above   Eyes: denies changes in vision or discharge.    Ears: denies changes in hearing or pain.   Nose: denies epistaxis or masses   Throat: denies sore throat or trouble swallowing.    Neuro: denies numbness, tingling, tremors.   Skin: denies rashes or itching.   GU: denies hematuria, dysuria   GI: denies vomiting, diarrhea   Psych: denies mood changed, anxiety, depression.         Physical exam:    Constitutional: A&O x3,  communicates well, no acute distress.  Eyes: extraocular muscles intact, PERRL.  Normal lids & conjunctiva.  No icterus.   ENT: clear, no bleeding.  No external masses.  Lips normal formation.  Neck: supple, full ROM, no JVD, no bruits, no lymphadenopathy.  No masses.  trachea midline.  Heart: irregular rate & rhythm, normal S1 & S2, I/VI (normal physiologic) systolic murmur, S4 gallop.  No heave.  Lungs: CTA.  No accessory muscles.  Abd: soft, non-tender.  Normal bowel sounds.   Neuro: Full ROM X 4, EOMI, no tremors.  EXT: No bilateral lower extremity edema  Skin: warm, dry, intact.  Good turgor.  Psych: A&O x 3, normal behavior, not anxious.      Assessment/Recommendations  1. Afib: rate controlled, on NOAC  2. LV systolic dysfunction likely due to HTN.   meds adjusted.  Does not need LIfeVest.  For lexiscan today

## 2017-10-11 ENCOUNTER — Encounter (INDEPENDENT_AMBULATORY_CARE_PROVIDER_SITE_OTHER): Payer: Self-pay | Admitting: Orthopedic Surgery

## 2017-10-11 ENCOUNTER — Ambulatory Visit (INDEPENDENT_AMBULATORY_CARE_PROVIDER_SITE_OTHER): Payer: Medicare Other | Admitting: Orthopedic Surgery

## 2017-10-11 ENCOUNTER — Ambulatory Visit: Payer: Self-pay | Admitting: Internal Medicine

## 2017-10-11 DIAGNOSIS — Z89512 Acquired absence of left leg below knee: Secondary | ICD-10-CM | POA: Diagnosis not present

## 2017-10-11 DIAGNOSIS — I639 Cerebral infarction, unspecified: Secondary | ICD-10-CM

## 2017-10-11 DIAGNOSIS — Z89511 Acquired absence of right leg below knee: Secondary | ICD-10-CM | POA: Diagnosis not present

## 2017-10-11 LAB — CBC WITH AUTO DIFFERENTIAL
Basophils %: 0.2 % (ref 0.0–2.0)
Basophils Absolute: 0.02 E9/L (ref 0.00–0.20)
Eosinophils %: 0.1 % (ref 0.0–6.0)
Eosinophils Absolute: 0.01 E9/L — ABNORMAL LOW (ref 0.05–0.50)
Hematocrit: 46.5 % (ref 37.0–54.0)
Hemoglobin: 15.3 g/dL (ref 12.5–16.5)
Immature Granulocytes #: 0.03 E9/L
Immature Granulocytes %: 0.3 % (ref 0.0–5.0)
Lymphocytes %: 20.6 % (ref 20.0–42.0)
Lymphocytes Absolute: 1.84 E9/L (ref 1.50–4.00)
MCH: 30.8 pg (ref 26.0–35.0)
MCHC: 32.9 % (ref 32.0–34.5)
MCV: 93.6 fL (ref 80.0–99.9)
MPV: 11.5 fL (ref 7.0–12.0)
Monocytes %: 8.3 % (ref 2.0–12.0)
Monocytes Absolute: 0.74 E9/L (ref 0.10–0.95)
Neutrophils %: 70.5 % (ref 43.0–80.0)
Neutrophils Absolute: 6.29 E9/L (ref 1.80–7.30)
Platelets: 118 E9/L — ABNORMAL LOW (ref 130–450)
RBC: 4.97 E12/L (ref 3.80–5.80)
RDW: 13 fL (ref 11.5–15.0)
WBC: 8.9 E9/L (ref 4.5–11.5)

## 2017-10-11 LAB — BASIC METABOLIC PANEL
Anion Gap: 10 mmol/L (ref 7–16)
BUN: 13 mg/dL (ref 8–23)
CO2: 26 mmol/L (ref 22–29)
Calcium: 8.9 mg/dL (ref 8.6–10.2)
Chloride: 99 mmol/L (ref 98–107)
Creatinine: 0.8 mg/dL (ref 0.7–1.2)
GFR African American: >60
GFR Non-African American: >60 mL/min/{1.73_m2} (ref >=60)
Glucose: 118 mg/dL — ABNORMAL HIGH (ref 74–99)
Potassium: 4 mmol/L (ref 3.5–5.0)
Sodium: 135 mmol/L (ref 132–146)

## 2017-10-11 LAB — MAGNESIUM: Magnesium: 2.2 mg/dL (ref 1.6–2.6)

## 2017-10-11 MED ORDER — APIXABAN 5 MG PO TABS
5 MG | ORAL_TABLET | Freq: Two times a day (BID) | ORAL | 0 refills | Status: AC
Start: 2017-10-11 — End: ?

## 2017-10-11 MED ORDER — LISINOPRIL 40 MG PO TABS
40 MG | ORAL_TABLET | Freq: Every day | ORAL | 0 refills | Status: DC
Start: 2017-10-11 — End: 2017-10-11

## 2017-10-11 MED ORDER — SPIRONOLACTONE 25 MG PO TABS
25 MG | ORAL_TABLET | Freq: Every day | ORAL | 0 refills | Status: DC
Start: 2017-10-11 — End: 2017-10-11

## 2017-10-11 MED ORDER — HYDRALAZINE HCL 50 MG PO TABS
50 MG | ORAL_TABLET | Freq: Three times a day (TID) | ORAL | 0 refills | Status: AC
Start: 2017-10-11 — End: ?

## 2017-10-11 MED ORDER — APIXABAN 5 MG PO TABS
5 MG | ORAL_TABLET | Freq: Two times a day (BID) | ORAL | 0 refills | Status: DC
Start: 2017-10-11 — End: 2017-10-11

## 2017-10-11 MED ORDER — CARVEDILOL 25 MG PO TABS
25 MG | ORAL_TABLET | Freq: Two times a day (BID) | ORAL | 0 refills | Status: DC
Start: 2017-10-11 — End: 2017-10-11

## 2017-10-11 MED ORDER — HYDRALAZINE HCL 50 MG PO TABS
50 MG | ORAL_TABLET | Freq: Three times a day (TID) | ORAL | 0 refills | Status: DC
Start: 2017-10-11 — End: 2017-10-11

## 2017-10-11 MED ORDER — LISINOPRIL 40 MG PO TABS
40 MG | ORAL_TABLET | Freq: Every day | ORAL | 0 refills | Status: AC
Start: 2017-10-11 — End: ?

## 2017-10-11 MED ORDER — CARVEDILOL 25 MG PO TABS
25 MG | ORAL_TABLET | Freq: Two times a day (BID) | ORAL | 0 refills | Status: AC
Start: 2017-10-11 — End: ?

## 2017-10-11 MED ORDER — SPIRONOLACTONE 25 MG PO TABS
25 MG | ORAL_TABLET | Freq: Every day | ORAL | 0 refills | Status: AC
Start: 2017-10-11 — End: ?

## 2017-10-11 MED ORDER — AMLODIPINE BESYLATE 5 MG PO TABS
5 MG | ORAL_TABLET | Freq: Every day | ORAL | 0 refills | Status: AC
Start: 2017-10-11 — End: ?

## 2017-10-11 MED FILL — TRAMADOL HCL 50 MG PO TABS: 50 mg | ORAL | Qty: 1

## 2017-10-11 NOTE — Plan of Care (Signed)
Problem: Pain:  Goal: Pain level will decrease  Description  Pain level will decrease  Outcome: Met This Shift     Problem: Pain:  Goal: Control of acute pain  Description  Control of acute pain  Outcome: Met This Shift     Problem: Pain:  Goal: Control of chronic pain  Description  Control of chronic pain  Outcome: Met This Shift

## 2017-10-11 NOTE — Discharge Summary (Signed)
Hospital Medicine Discharge Summary    Patient ID: Dylan Beasley      Patient's PCP: No primary care provider on file.    Admit Date: 10/06/2017     Discharge Date:  10/11/2017      Admitting Physician: Dylan Billow, MD     Discharge Physician: Dylan Bruins, DO      Condition at discharge: Stable    Disposition: Home    Discharge Diagnoses:       Active Hospital Problems    Diagnosis Date Noted   ??? Influenza A virus present [J10.1] 10/11/2017     Priority: High   ??? Chest pain [R07.9] 10/06/2017     Priority: High   ??? Prediabetes [R73.03] 10/11/2017   ??? Cardiomyopathy (HCC) [I42.9] 10/08/2017       The patient was seen and examined on day of discharge and this discharge summary is in conjunction with any daily progress note from day of discharge.    Admission HPI: 67 yo male hx afib hlp htn came to ER with palpitations chest pain radiating to left arm, denies fever chills, cough no pnd/orthopnea, chest pain going on 3 days, in ER ekg afib rate controlled, cxr clear trop negative , off coumadin by his noncompliance due to hx bleeding from high INR in past.     Hospital Course:   [name], a 67 y.o. year old male  who  has a past medical history of Atrial fibrillation (HCC), HLD (hyperlipidemia), Hypertension, and Inguinal hernia.     During the course the patient's hospital stay:    -- Admitted for palpitations / Chest Pain X 3 Days  -- Flu (+)   -- Hx of Cardiomyopathy (? Tachycardia Associated vs Ischemic)   -- Had Hx of Chronic Systolic Heart Failure with Reduced Ejection Fraction, Now Recovered to 40-45%  -- Stress Testing performed on 3/17 and found to be negative for reversible defects. Has a large fixed defect    With time, the patient improved to the point that on 3/18 he was deemed stable and was able to be discharged to home. To follow up with PCP within 1 week and with specialists as directed.       Consults:     IP CONSULT TO INTERNAL MEDICINE  IP CONSULT TO CARDIOLOGY    Significant Diagnostic  Studies:  As above      Discharge Instructions/Follow-up:  As above       Activity: activity as tolerated    Physical Exam:  Vitals:    10/11/17 0945   BP: 132/82   Pulse: 85   Resp: 16   Temp: 98.6 ??F (37 ??C)   SpO2: 96%       General appearance: No apparent distress, appears stated age and cooperative.  HEENT: Conjunctivae/corneas clear. Pupils equal and round. No injections noted.  Neck: Supple. No lesions, scars or masses. Trachea midline.  Respiratory:  Normal respiratory effort. Clear to auscultation, bilaterally without Rales/Wheezes/Rhonchi.  Cardiovascular: Regular rate and rhythm with normal S1/S2 without murmurs, rubs or gallops.  Abdomen: Soft, non-tender, non-distended with normal bowel sounds.  Musculoskeletal: No clubbing, cyanosis or edema bilaterally. Brisk capillary refill. 2+ lower extremity pulses (dorsalis pedis).   Skin:  No rashes    Neurologic: awake, alert and following commands     Labs: For convenience and continuity at follow-up the following most recent labs are provided:      CBC:    Lab Results   Component Value Date  WBC 8.9 10/11/2017    HGB 15.3 10/11/2017    HCT 46.5 10/11/2017    PLT 118 10/11/2017       Renal:    Lab Results   Component Value Date    NA 135 10/11/2017    K 4.0 10/11/2017    K 4.4 10/07/2017    CL 99 10/11/2017    CO2 26 10/11/2017    BUN 13 10/11/2017    CREATININE 0.8 10/11/2017    CALCIUM 8.9 10/11/2017       Imaging:  NM Cardiac Stress Test Nuclear Imaging   Final Result   1. No reversible perfusion defect. Large fixed inferior wall defect   2. Ejection fraction is 51 %.   3. Inferior wall hypokinetic wall motion         XR CHEST PORTABLE   Final Result   No active process and no interim change         XR CHEST STANDARD (2 VW)   Final Result   No active infiltrate or vascular congestion.                  Discharge Medications:     Discharge Medication List as of 10/11/2017  3:38 PM           Details   apixaban (ELIQUIS) 5 MG TABS tablet Take 1 tablet by mouth  2 times daily, Disp-60 tablet, R-0Print      hydrALAZINE (APRESOLINE) 50 MG tablet Take 1 tablet by mouth every 8 hours, Disp-90 tablet, R-0Print      lisinopril (PRINIVIL;ZESTRIL) 40 MG tablet Take 1 tablet by mouth daily, Disp-30 tablet, R-0Print      carvedilol (COREG) 25 MG tablet Take 1 tablet by mouth 2 times daily (with meals), Disp-60 tablet, R-0Print      amLODIPine (NORVASC) 5 MG tablet Take 1 tablet by mouth daily, Disp-30 tablet, R-0Print      spironolactone (ALDACTONE) 25 MG tablet Take 0.5 tablets by mouth daily, Disp-30 tablet, R-0Print              Details   isosorbide mononitrate (IMDUR) 60 MG CR tablet Take 60 mg by mouth daily.      Vitamin D (CHOLECALCIFEROL) 1000 UNITS CAPS capsule Take 2,000 Units by mouth daily.             Time Spent on discharge is more than 31 min in the examination, evaluation, counseling and review of medications and discharge plan.      Signed:    Moody Bruinserek Atzel Mccambridge, DO   10/11/2017      Thank you No primary care provider on file. for the opportunity to be involved in this patient's care. If you have any questions or concerns please feel free to contact me.     NOTE: This report was transcribed using voice recognition software. Every effort was made to ensure accuracy; however, inadvertent computerized transcription errors may be present.

## 2017-10-11 NOTE — Progress Notes (Signed)
Office Visit Note   Patient: Oscar Castillo           Date of Birth: August 12, 1950           MRN: [id number] Visit Date: 10/11/2017              Requested by: [name], MD [address], [place] [address] PCP: [name], MD  Chief Complaint  Patient presents with  . Left Leg - Follow-up      HPI: Patient presents in follow-up status post bilateral transtibial amputations.  Patient has been working on his flexion contractures of his knees.  Patient states that he went to [facility] to be evaluated for prosthesis for both lower extremities.  Patient was told that he did not have sufficient arm strength at this time.  Assessment & Plan: Visit Diagnoses:  1. S/P BKA (below knee amputation) bilateral ([place])     Plan: Patient will resume his strength training for both upper extremities he will continue working on extension for both knees.  Follow-Up Instructions: Return in about 4 weeks (around 11/08/2017).   Ortho Exam  Patient is alert, oriented, no adenopathy, well-dressed, normal affect, normal respiratory effort. Examination patient has improvement in the flexion contracture of both knees.  He lacks about 10 degrees to full extension of both knees.  There is a flexible endpoint.  There is no redness no cellulitis no signs of infection.  Patient states that he does have weakness in his upper extremities he feels this is secondary to his dialysis.  Patient is ambulating in a wheelchair.  Imaging: No results found. No images are attached to the encounter.  Labs: Lab Results  Component Value Date   HGBA1C 7.1 (H) 02/10/2017   HGBA1C 6.4 (H) 09/15/2016   HGBA1C 6.2 12/11/2015   ESRSEDRATE 27 (H) 05/09/2013   CRP 6.7 (H) 05/09/2013   REPTSTATUS 07/12/2017 FINAL 07/07/2017   GRAMSTAIN  11/09/2016    FEW WBC PRESENT,BOTH PMN AND MONONUCLEAR NO ORGANISMS SEEN    CULT NO GROWTH 5 DAYS 07/07/2017   LABORGA STAPHYLOCOCCUS AUREUS 11/09/2016     @LABSALLVALUES (HGBA1)@  There is no height or weight on file to calculate BMI.  Orders:  No orders of the defined types were placed in this encounter.  No orders of the defined types were placed in this encounter.    Procedures: No procedures performed  Clinical Data: No additional findings.  ROS:  All other systems negative, except as noted in the HPI. Review of Systems  Objective: Vital Signs: There were no vitals taken for this visit.  Specialty Comments:  No specialty comments available.  PMFS History: Patient Active Problem List   Diagnosis Date Noted  . Chronic pain 09/07/2017  . Non-pressure chronic ulcer of left calf limited to breakdown of skin ([place]) 07/30/2017  . Dysphagia 07/10/2017  . Sacral ulcer ([place]) 05/07/2017  . CHB (complete heart block) ([place]) 04/29/2017  . Heart block AV third degree ([place]) 04/29/2017  . Complete heart block ([place])   . PAD (peripheral artery disease) ([place])   . General weakness 02/10/2017  . Gait disorder 02/10/2017  . Chronic neck pain 12/14/2016  . MSSA bacteremia   . Sepsis ([place]) 11/08/2016  . Encounter for therapeutic drug monitoring 10/07/2016  . Diabetes mellitus with complication ([place])   . Anemia due to stage 5 chronic kidney disease ([place])   . Hypotension   . Melena 09/26/2016  . Pressure injury of skin 09/26/2016  .  Benign neoplasm of descending colon   . Gastroesophageal reflux disease with esophagitis   . Duodenal ulcer without hemorrhage or perforation   . Blood loss anemia 09/15/2016  . Paroxysmal atrial fibrillation ([place]) 09/15/2016  . Heme positive stool 09/15/2016  . RUQ pain 07/12/2016  . Hyperkalemia 07/01/2016  . Mixed hyperlipidemia 03/13/2016  . Anemia 12/31/2015  . Glaucoma 12/27/2015  . LBBB (left bundle branch block)   . Rash 11/15/2015  . Cramping of hands 11/15/2015  . ESRD on dialysis ([place]) 10/14/2015  . Liver fibrosis 10/07/2015  . Cigarette nicotine dependence without complication  00/71/2197  . Obesity (BMI 30-39.9) 06/06/2015  . Chronic bilateral low back pain without sciatica 06/06/2015  . Organ transplant candidate 05/17/2015  . DM (diabetes mellitus) with peripheral vascular complication ([place]) 58/83/2549  . Insulin-dependent diabetes mellitus with proliferative retinopathy ([place]) 05/17/2015  . Chronic bronchitis ([place]) 05/10/2015  . History of alcoholism ([place]) 05/10/2015  . History of peptic ulcer 05/10/2015  . Diarrhea 08/14/2014  . Dehydration 10/02/2013  . Fever 10/02/2013  . Altered mental status 10/01/2013  . Encephalopathy, toxic 10/01/2013  . Encephalopathy, metabolic 82/64/1583  . History of stroke 09/28/2013  . FTT (failure to thrive) in adult 09/28/2013  . Acute confusional state 09/28/2013  . Ulcer of sacral region, stage 3 ([place]) 09/26/2013  . S/P BKA (below knee amputation) bilateral ([place]) 09/08/2013  . ESRD on hemodialysis ([place]) 05/09/2013  . Cerebrovascular accident ([place]) 02/20/2013  . Acute blood loss anemia 10/28/2012  . Acute on chronic combined systolic and diastolic CHF (congestive heart failure) ([place]) 10/28/2012  . Constipation 10/28/2012  . Chronic combined systolic and diastolic heart failure ([place]) 10/28/2012  . GI bleed 10/27/2012  . Mass in rectum 10/27/2012  . Type 2 diabetes mellitus with chronic kidney disease on chronic dialysis, with long-term current use of insulin ([place]) 09/08/2012  . Essential (primary) hypertension 09/08/2012  . LVH (left ventricular hypertrophy)-severe concentric 06/13/2012  . Anemia in other chronic diseases classified elsewhere 06/12/2012  . Hyperlipidemia, unspecified 01/30/2011  . CHOLELITHIASIS 08/01/2010  . BENIGN PROSTATIC HYPERTROPHY 08/01/2010  . History of cardiovascular disorder 08/06/2009  . Depression 03/18/2009  . NEPHROLITHIASIS, HX OF 03/18/2009  . Major depressive disorder, single episode, unspecified 03/18/2009  . Morbid obesity ([place]) 03/25/2007  . Hypertensive heart disease with CHF  (congestive heart failure) ([place]) 03/25/2007  . GERD (gastroesophageal reflux disease) 03/25/2007  . Chronic hepatitis C without hepatic coma ([place]) 03/25/2007   Past Medical History:  Diagnosis Date  . Anemia   . Antral ulcer 2014   small  . BENIGN PROSTATIC HYPERTROPHY   . Bipolar disorder ([place])    "sometimes" (10/07/2016)  . CHOLELITHIASIS   . Chronic combined systolic and diastolic CHF (congestive heart failure) ([place])   . Complication of anesthesia    wife states pt had trouble waking up with in Nov., 2014  . CVA (cerebral vascular accident) ([place]) 07/2007   No residual effect  . DEPRESSION   . DIABETES MELLITUS, TYPE II    diet control.  No medication  since November 2015  . ERECTILE DYSFUNCTION   . ESRD on hemodialysis ([place])    ESRD due to DM/HTN. .  HD TTS at [facility] on [place].  [name] GERD   . GI bleed    due to gastritis, discharged 10/02/16/notes 10/07/2016  . Hepatitis C    C - has been treated  . History of Clostridium difficile   . History of kidney stones   . HYPERTENSION   .  LBBB (left bundle branch block)   . Morbid obesity ([place])   . PAF (paroxysmal atrial fibrillation) ([place])    a. Dx 12/2015.    Family History  Problem Relation Age of Onset  . Diabetes Mother   . Hypertension Mother   . Heart attack Father   . Hypertension Father   . Coronary artery disease Other     Past Surgical History:  Procedure Laterality Date  . AMPUTATION Left 05/12/2013   Procedure: AMPUTATION RAY;  Surgeon: Newt Minion, MD;  Location: [facility];  Service: Orthopedics;  Laterality: Left;  Left Foot 1st Ray Amputation  . AMPUTATION Left 06/09/2013   Procedure: AMPUTATION BELOW KNEE;  Surgeon: Newt Minion, MD;  Location: [facility];  Service: Orthopedics;  Laterality: Left;  Left Below Knee Amputation and removal proximal screws IM tibial nail  . AMPUTATION Right 09/08/2013   Procedure: AMPUTATION BELOW KNEE;  Surgeon: Newt Minion, MD;  Location: [facility];  Service: Orthopedics;   Laterality: Right;  Right Below Knee Amputation  . AMPUTATION Right 10/11/2013   Procedure: AMPUTATION BELOW KNEE;  Surgeon: Newt Minion, MD;  Location: [facility];  Service: Orthopedics;  Laterality: Right;  Right Below Knee Amputation Revision  . AV FISTULA PLACEMENT  06/14/2012   Procedure: ARTERIOVENOUS (AV) FISTULA CREATION;  Surgeon: Angelia Mould, MD;  Location[facility] OR;  Service: Vascular;  Laterality: Left;  Left basilic vein transposition with fistula.  . AV FISTULA PLACEMENT Left 07/21/2017   Procedure: Attempt INSERTION OF ARTERIOVENOUS (AV) WITH  ARTEGRAFT Left  UPPER ARM;  Surgeon: Conrad Weston, MD;  Location: [facility];  Service: Vascular;  Laterality: Left;  . BASCILIC VEIN TRANSPOSITION Left 02/17/2017   Procedure: LEFT 1ST STAGE BRACHIAL VEIN TRANSPOSITION;  Surgeon: Conrad Amador, MD;  Location: [facility];  Service: Vascular;  Laterality: Left;  . BASCILIC VEIN TRANSPOSITION Left 04/21/2017   Procedure: LEFT 2ND STAGE BRACHIAL VEIN TRANSPOSITION;  Surgeon: Conrad Nenahnezad, MD;  Location: [place];  Service: Vascular;  Laterality: Left;  . COLONOSCOPY N/A 10/28/2012   Procedure: COLONOSCOPY;  Surgeon: Jeryl Columbia, MD;  Location: [facility] ENDOSCOPY;  Service: Endoscopy;  Laterality: N/A;  . COLONOSCOPY N/A 11/02/2012   Procedure: COLONOSCOPY;  Surgeon: Cleotis Nipper, MD;  Location: [facility] ENDOSCOPY;  Service: Endoscopy;  Laterality: N/A;  . COLONOSCOPY N/A 11/03/2012   Procedure: COLONOSCOPY;  Surgeon: Cleotis Nipper, MD;  Location: [facility] ENDOSCOPY;  Service: Endoscopy;  Laterality: N/A;  . COLONOSCOPY N/A 09/16/2016   Procedure: COLONOSCOPY;  Surgeon: Jerene Bears, MD;  Location: [facility] ENDOSCOPY;  Service: Gastroenterology;  Laterality: N/A;  . ENTEROSCOPY N/A 11/08/2012   Procedure: ENTEROSCOPY;  Surgeon: Wonda Horner, MD;  Location: [facility] ENDOSCOPY;  Service: Endoscopy;  Laterality: N/A;  . ESOPHAGOGASTRODUODENOSCOPY N/A 11/02/2012   Procedure: ESOPHAGOGASTRODUODENOSCOPY (EGD);  Surgeon: Cleotis Nipper,  MD;  Location: [facility] ENDOSCOPY;  Service: Endoscopy;  Laterality: N/A;  . ESOPHAGOGASTRODUODENOSCOPY (EGD) WITH PROPOFOL N/A 09/16/2016   Procedure: ESOPHAGOGASTRODUODENOSCOPY (EGD) WITH PROPOFOL;  Surgeon: Jerene Bears, MD;  Location: [facility] ENDOSCOPY;  Service: Gastroenterology;  Laterality: N/A;  . EXCHANGE OF A DIALYSIS CATHETER Left 11/13/2016   Procedure: EXCHANGE OF A DIALYSIS CATHETER;  Surgeon: Elam Dutch, MD;  Location: [place];  Service: Vascular;  Laterality: Left;  . EYE SURGERY Left    to remove scar tissue  . GIVENS CAPSULE STUDY N/A 11/04/2012   Procedure: GIVENS CAPSULE STUDY;  Surgeon: Cleotis Nipper, MD;  Location: [facility]  ENDOSCOPY;  Service: Endoscopy;  Laterality: N/A;  . GIVENS CAPSULE STUDY N/A 09/29/2016   Procedure: GIVENS CAPSULE STUDY;  Surgeon: Manus Gunning, MD;  Location: [place];  Service: Gastroenterology;  Laterality: N/A;  try to keep pt up in bedside chair as much as possible during the study.    [name] HARDWARE REMOVAL Left 06/09/2013   Procedure: HARDWARE REMOVAL;  Surgeon: Newt Minion, MD;  Location: [facility];  Service: Orthopedics;  Laterality: Left;  Left Below Knee Amputation  and Removal proximal screws IM tibial nail  . HEMATOMA EVACUATION Left 04/29/2017   Procedure: EVACUATION HEMATOMA LEFT ARM;  Surgeon: Conrad [place], MD;  Location: [facility];  Service: Vascular;  Laterality: Left;  . INSERTION OF DIALYSIS CATHETER N/A 11/09/2016   Procedure: INSERTION OF TEMPORARY DIALYSIS CATHETER;  Surgeon: Elam Dutch, MD;  Location: [facility];  Service: Vascular;  Laterality: N/A;  . IR GENERIC HISTORICAL  09/27/2016   IR US GUIDE VASC ACCESS RIGHT 09/27/2016 Aletta Edouard, MD [facility]-INTER[facility] RAD  . IR GENERIC HISTORICAL  09/27/2016   IR FLUORO GUIDE CV LINE RIGHT 09/27/2016 Aletta Edouard, MD [facility]  . LIGATION OF ARTERIOVENOUS  FISTULA Left 11/09/2016   Procedure: LIGATION OF ARTERIOVENOUS  FISTULA;  Surgeon: Elam Dutch, MD;  Location: [facility];  Service: Vascular;   Laterality: Left;  . ORIF FIBULA FRACTURE Left 09/09/2012   Procedure: OPEN REDUCTION INTERNAL FIXATION (ORIF) FIBULA FRACTURE;  Surgeon: Johnny Bridge, MD;  Location: [place];  Service: Orthopedics;  Laterality: Left;  . PACEMAKER IMPLANT N/A 04/30/2017   Procedure: PACEMAKER IMPLANT;  Surgeon: Evans Lance, MD;  Location: [place] CV LAB;  Service: Cardiovascular;  Laterality: N/A;  . TEMPORARY PACEMAKER N/A 04/29/2017   Procedure: TEMPORARY PACEMAKER;  Surgeon: Lorretta Harp, MD;  Location: [place] CV LAB;  Service: Cardiovascular;  Laterality: N/A;  . TIBIA IM NAIL INSERTION Left 09/09/2012   Procedure: INTRAMEDULLARY (IM) NAIL TIBIAL;  Surgeon: Johnny Bridge, MD;  Location: [facility];  Service: Orthopedics;  Laterality: Left;  left tibial nail and open reduction internal fixation left fibula fracture  . UPPER EXTREMITY VENOGRAPHY N/A 12/14/2016   Procedure: Bilateral Upper Extremity Venography and Central Venography;  Surgeon: Conrad , MD;  Location: [place] CV LAB;  Service: Cardiovascular;  Laterality: N/A;   Social History   Occupational History  . Occupation: disabled due to stroke    Employer: RETIRED  Tobacco Use  . Smoking status: Never Smoker  . Smokeless tobacco: Never Used  Substance and Sexual Activity  . Alcohol use: No  . Drug use: No  . Sexual activity: Yes    Birth control/protection: None

## 2017-10-11 NOTE — Care Coordination-Inpatient (Signed)
Patient Admitted for palpitations / Chest Pain X 3 Days-- Stress Testing performed on 3/17 and found to be negative for reversible defects. Has a large fixed defect. He also has influenza A. RN weaning O2. Cm/Sw will continue to follow.  Larrie Kass RN CM

## 2017-10-11 NOTE — Progress Notes (Signed)
Patient left without receiving discharge instructions or paper scripts. Attempted to reach patient at home, but phone was turned off. Family member Catering manager) stopped by unit to visit patient without realizing patient had left. Family member agreed to take discharge paperwork and scripts to patient's house. Family member instructed to call unit to speak with nurse to go over discharge instructions. Contact info for Weyman Croon ([phone])

## 2017-10-12 DIAGNOSIS — N2581 Secondary hyperparathyroidism of renal origin: Secondary | ICD-10-CM | POA: Diagnosis not present

## 2017-10-12 DIAGNOSIS — D631 Anemia in chronic kidney disease: Secondary | ICD-10-CM | POA: Diagnosis not present

## 2017-10-12 DIAGNOSIS — D509 Iron deficiency anemia, unspecified: Secondary | ICD-10-CM | POA: Diagnosis not present

## 2017-10-12 DIAGNOSIS — N186 End stage renal disease: Secondary | ICD-10-CM | POA: Diagnosis not present

## 2017-10-13 ENCOUNTER — Ambulatory Visit (INDEPENDENT_AMBULATORY_CARE_PROVIDER_SITE_OTHER): Payer: Medicare Other | Admitting: Internal Medicine

## 2017-10-13 ENCOUNTER — Encounter: Payer: Self-pay | Admitting: Internal Medicine

## 2017-10-13 VITALS — BP 118/78 | HR 73 | Temp 98.5°F | Ht 70.0 in | Wt 210.0 lb

## 2017-10-13 DIAGNOSIS — E7849 Other hyperlipidemia: Secondary | ICD-10-CM | POA: Diagnosis not present

## 2017-10-13 DIAGNOSIS — E118 Type 2 diabetes mellitus with unspecified complications: Secondary | ICD-10-CM

## 2017-10-13 DIAGNOSIS — E1151 Type 2 diabetes mellitus with diabetic peripheral angiopathy without gangrene: Secondary | ICD-10-CM | POA: Diagnosis not present

## 2017-10-13 DIAGNOSIS — I639 Cerebral infarction, unspecified: Secondary | ICD-10-CM

## 2017-10-13 DIAGNOSIS — I10 Essential (primary) hypertension: Secondary | ICD-10-CM

## 2017-10-13 LAB — POCT GLYCOSYLATED HEMOGLOBIN (HGB A1C): Hemoglobin A1C: 6.8

## 2017-10-13 NOTE — Patient Instructions (Signed)
Your A1c was OK today  Please continue all other medications as before, and refills have been done if requested.  Please have the pharmacy call with any other refills you may need.  Please continue your efforts at being more active, low cholesterol diet, and weight control.  You are otherwise up to date with prevention measures today.  Please keep your appointments with your specialists as you may have planned  Please return in 6 months, or sooner if needed 

## 2017-10-13 NOTE — Progress Notes (Signed)
Subjective:    Patient ID: Oscar Castillo, male    DOB: August 11, 1950, 67 y.o.   MRN: [id number]  HPI  Here to f/u; overall doing ok,  Pt denies chest pain, increasing sob or doe, wheezing, orthopnea, PND, increased LE swelling, palpitations, dizziness or syncope.  Pt denies new neurological symptoms such as new headache, or facial or extremity weakness or numbness.  Pt denies polydipsia, polyuria, or low sugar episode.  Pt states overall good compliance with meds, mostly trying to follow appropriate diet, with wt overall stable,  but little exercise however.  Bp usually well controlled per pt, in fact has been low at times post HD.  No other interval hx or new complaints Past Medical History:  Diagnosis Date  . Anemia   . Antral ulcer 2014   small  . BENIGN PROSTATIC HYPERTROPHY   . Bipolar disorder ([place])    "sometimes" (10/07/2016)  . CHOLELITHIASIS   . Chronic combined systolic and diastolic CHF (congestive heart failure) ([place])   . Complication of anesthesia    wife states pt had trouble waking up with in Nov., 2014  . CVA (cerebral vascular accident) ([place]) 07/2007   No residual effect  . DEPRESSION   . DIABETES MELLITUS, TYPE II    diet control.  No medication  since November 2015  . ERECTILE DYSFUNCTION   . ESRD on hemodialysis ([place])    ESRD due to DM/HTN. .  HD TTS at [facility] on [address].  [name] GERD   . GI bleed    due to gastritis, discharged 10/02/16/notes 10/07/2016  . Hepatitis C    C - has been treated  . History of Clostridium difficile   . History of kidney stones   . HYPERTENSION   . LBBB (left bundle branch block)   . Morbid obesity ([place])   . PAF (paroxysmal atrial fibrillation) ([place])    a. Dx 12/2015.   Past Surgical History:  Procedure Laterality Date  . AMPUTATION Left 05/12/2013   Procedure: AMPUTATION RAY;  Surgeon: Newt Minion, MD;  Location: [place];  Service: Orthopedics;  Laterality: Left;  Left Foot 1st Ray Amputation  . AMPUTATION Left 06/09/2013   Procedure: AMPUTATION BELOW KNEE;  Surgeon: Newt Minion, MD;  Location: [place];  Service: Orthopedics;  Laterality: Left;  Left Below Knee Amputation and removal proximal screws IM tibial nail  . AMPUTATION Right 09/08/2013   Procedure: AMPUTATION BELOW KNEE;  Surgeon: Newt Minion, MD;  Location: [place];  Service: Orthopedics;  Laterality: Right;  Right Below Knee Amputation  . AMPUTATION Right 10/11/2013   Procedure: AMPUTATION BELOW KNEE;  Surgeon: Newt Minion, MD;  Location: [place];  Service: Orthopedics;  Laterality: Right;  Right Below Knee Amputation Revision  . AV FISTULA PLACEMENT  06/14/2012   Procedure: ARTERIOVENOUS (AV) FISTULA CREATION;  Surgeon: Angelia Mould, MD;  Location: [facility] OR;  Service: Vascular;  Laterality: Left;  Left basilic vein transposition with fistula.  . AV FISTULA PLACEMENT Left 07/21/2017   Procedure: Attempt INSERTION OF ARTERIOVENOUS (AV) WITH  ARTEGRAFT Left  UPPER ARM;  Surgeon: Conrad Dunmore, MD;  Location: [place];  Service: Vascular;  Laterality: Left;  . BASCILIC VEIN TRANSPOSITION Left 02/17/2017   Procedure: LEFT 1ST STAGE BRACHIAL VEIN TRANSPOSITION;  Surgeon: Conrad Budd Lake, MD;  Location: [place];  Service: Vascular;  Laterality: Left;  . BASCILIC VEIN TRANSPOSITION Left 04/21/2017   Procedure: LEFT 2ND STAGE BRACHIAL VEIN TRANSPOSITION;  Surgeon: Conrad Fredericksburg, MD;  Location: [place];  Service: Vascular;  Laterality: Left;  . COLONOSCOPY N/A 10/28/2012   Procedure: COLONOSCOPY;  Surgeon: Jeryl Columbia, MD;  Location: [facility] ENDOSCOPY;  Service: Endoscopy;  Laterality: N/A;  . COLONOSCOPY N/A 11/02/2012   Procedure: COLONOSCOPY;  Surgeon: Cleotis Nipper, MD;  Location: [facility] ENDOSCOPY;  Service: Endoscopy;  Laterality: N/A;  . COLONOSCOPY N/A 11/03/2012   Procedure: COLONOSCOPY;  Surgeon: Cleotis Nipper, MD;  Location: [facility] ENDOSCOPY;  Service: Endoscopy;  Laterality: N/A;  . COLONOSCOPY N/A 09/16/2016   Procedure: COLONOSCOPY;  Surgeon: Jerene Bears, MD;   Location: [facility] ENDOSCOPY;  Service: Gastroenterology;  Laterality: N/A;  . ENTEROSCOPY N/A 11/08/2012   Procedure: ENTEROSCOPY;  Surgeon: Wonda Horner, MD;  Location: [facility]COPY;  Service: Endoscopy;  Laterality: N/A;  . ESOPHAGOGASTRODUODENOSCOPY N/A 11/02/2012   Procedure: ESOPHAGOGASTRODUODENOSCOPY (EGD);  Surgeon: Cleotis Nipper, MD;  Location: [facility] ENDOSCOPY;  Service: Endoscopy;  Laterality: N/A;  . ESOPHAGOGASTRODUODENOSCOPY (EGD) WITH PROPOFOL N/A 09/16/2016   Procedure: ESOPHAGOGASTRODUODENOSCOPY (EGD) WITH PROPOFOL;  Surgeon: Jerene Bears, MD;  Location: [facility] ENDOSCOPY;  Service: Gastroenterology;  Laterality: N/A;  . EXCHANGE OF A DIALYSIS CATHETER Left 11/13/2016   Procedure: EXCHANGE OF A DIALYSIS CATHETER;  Surgeon: Elam Dutch, MD;  Location: [place];  Service: Vascular;  Laterality: Left;  . EYE SURGERY Left    to remove scar tissue  . GIVENS CAPSULE STUDY N/A 11/04/2012   Procedure: GIVENS CAPSULE STUDY;  Surgeon: Cleotis Nipper, MD;  Location: [facility] ENDOSCOPY;  Service: Endoscopy;  Laterality: N/A;  . GIVENS CAPSULE STUDY N/A 09/29/2016   Procedure: GIVENS CAPSULE STUDY;  Surgeon: Manus Gunning, MD;  Location: [place];  Service: Gastroenterology;  Laterality: N/A;  try to keep pt up in bedside chair as much as possible during the study.    [name] HARDWARE REMOVAL Left 06/09/2013   Procedure: HARDWARE REMOVAL;  Surgeon: Newt Minion, MD;  Location: [place];  Service: Orthopedics;  Laterality: Left;  Left Below Knee Amputation  and Removal proximal screws IM tibial nail  . HEMATOMA EVACUATION Left 04/29/2017   Procedure: EVACUATION HEMATOMA LEFT ARM;  Surgeon: Conrad Stockton, MD;  Location: [place];  Service: Vascular;  Laterality: Left;  . INSERTION OF DIALYSIS CATHETER N/A 11/09/2016   Procedure: INSERTION OF TEMPORARY DIALYSIS CATHETER;  Surgeon: Elam Dutch, MD;  Location: [place];  Service: Vascular;  Laterality: N/A;  . IR GENERIC HISTORICAL  09/27/2016   IR US GUIDE VASC  ACCESS RIGHT 09/27/2016 Aletta Edouard, MD [facility]-INTER[facility]  . IR GENERIC HISTORICAL  09/27/2016   IR FLUORO GUIDE CV LINE RIGHT 09/27/2016 Aletta Edouard, MD [facility]  . LIGATION OF ARTERIOVENOUS  FISTULA Left 11/09/2016   Procedure: LIGATION OF ARTERIOVENOUS  FISTULA;  Surgeon: Elam Dutch, MD;  Location: [place];  Service: Vascular;  Laterality: Left;  . ORIF FIBULA FRACTURE Left 09/09/2012   Procedure: OPEN REDUCTION INTERNAL FIXATION (ORIF) FIBULA FRACTURE;  Surgeon: Johnny Bridge, MD;  Location: [place];  Service: Orthopedics;  Laterality: Left;  . PACEMAKER IMPLANT N/A 04/30/2017   Procedure: PACEMAKER IMPLANT;  Surgeon: Evans Lance, MD;  Location: [place] CV LAB;  Service: Cardiovascular;  Laterality: N/A;  . TEMPORARY PACEMAKER N/A 04/29/2017   Procedure: TEMPORARY PACEMAKER;  Surgeon: Lorretta Harp, MD;  Location: [place] CV LAB;  Service: Cardiovascular;  Laterality: N/A;  . TIBIA IM NAIL INSERTION Left 09/09/2012   Procedure: INTRAMEDULLARY (IM)  NAIL TIBIAL;  Surgeon: Johnny Bridge, MD;  Location: [place];  Service: Orthopedics;  Laterality: Left;  left tibial nail and open reduction internal fixation left fibula fracture  . UPPER EXTREMITY VENOGRAPHY N/A 12/14/2016   Procedure: Bilateral Upper Extremity Venography and Central Venography;  Surgeon: Conrad Independence, MD;  Location: [place] CV LAB;  Service: Cardiovascular;  Laterality: N/A;    reports that he has never smoked. He has never used smokeless tobacco. He reports that he does not drink alcohol or use drugs. family history includes Coronary artery disease in his other; Diabetes in his mother; Heart attack in his father; Hypertension in his father and mother. Allergies  Allergen Reactions  . Ceftriaxone Sodium In Dextrose Anaphylaxis  . Ciprofloxacin Other (See Comments)    UNSPECIFIED PAIN  . Nsaids Other (See Comments)    HISTORY OF ULCER  . Penicillins Swelling    SWELLING REACTION UNSPECIFIED  [ PATIENT  WITH HX OF ANAPHYLAXIS TO CEFTRIAXONE ]  . Sulfa Antibiotics Swelling    SWELLING REACTION UNSPECIFIED    Current Outpatient Medications on File Prior to Visit  Medication Sig Dispense Refill  . acetaminophen (TYLENOL) 500 MG tablet Take 500 mg by mouth every 6 (six) hours as needed for mild pain.    [name] amiodarone (PACERONE) 200 MG tablet TAKE 1 TABLET BY MOUTH DAILY, START THIS ON 11/06/16 90 tablet 3  . atorvastatin (LIPITOR) 40 MG tablet TAKE 1 TABLET(40 MG) BY MOUTH DAILY AT 6 PM 30 tablet 2  . calcium acetate (PHOSLO) 667 MG capsule Take 1,334 mg by mouth 3 (three) times daily with meals.     . gabapentin (NEURONTIN) 300 MG capsule Take 1 capsule (300 mg total) by mouth 3 (three) times daily. 3 times a day when necessary neuropathy pain (Patient taking differently: Take 300 mg by mouth 3 (three) times daily as needed (nerve pain). ) 90 capsule 3  . latanoprost (XALATAN) 0.005 % ophthalmic solution Place 1 drop into both eyes at bedtime.     . midodrine (PROAMATINE) 5 MG tablet TAKE 1 TABLET(5 MG) BY MOUTH TWICE DAILY WITH A MEAL 180 tablet 0  . midodrine (PROAMATINE) 5 MG tablet TAKE 1 TABLET(5 MG) BY MOUTH TWICE DAILY WITH A MEAL 60 tablet 0  . oxyCODONE-acetaminophen (PERCOCET/ROXICET) 5-325 MG tablet Take 1 tablet by mouth every 6 (six) hours as needed. 6 tablet 0  . pantoprazole (PROTONIX) 40 MG tablet Take 1 tablet (40 mg total) by mouth 2 (two) times daily before a meal. 60 tablet 11  . pentoxifylline (TRENTAL) 400 MG CR tablet Take 1 tablet (400 mg total) by mouth 3 (three) times daily with meals. 90 tablet 3  . QUEtiapine (SEROQUEL) 25 MG tablet Take 0.5 tablets (12.5 mg total) by mouth at bedtime. 60 tablet 8  . senna (SENOKOT) 8.6 MG TABS tablet Take 1 tablet (8.6 mg total) by mouth daily. 120 each 0  . SENSIPAR 30 MG tablet Take 30 mg by mouth every evening.     . silver sulfADIAZINE (SSD) 1 % cream APPLY EXTERNALLY TO THE AFFECTED AREA DAILY 50 g 0  . sorbitol 70 % SOLN Take 30  mLs by mouth 2 (two) times daily. (Patient taking differently: Take 30 mLs by mouth 2 (two) times daily as needed for moderate constipation. ) 473 mL 0  . traMADol (ULTRAM) 50 MG tablet Take 50 mg by mouth every 6 (six) hours as needed for moderate pain.    [name] warfarin (COUMADIN) 2.5  MG tablet Take as directed by Anticoagulation Clinic (Patient taking differently: Take 2.5 mg by mouth once daily at night) 40 tablet 3   No current facility-administered medications on file prior to visit.    Review of Systems  Constitutional: Negative for other unusual diaphoresis or sweats HENT: Negative for ear discharge or swelling Eyes: Negative for other worsening visual disturbances Respiratory: Negative for stridor or other swelling  Gastrointestinal: Negative for worsening distension or other blood Genitourinary: Negative for retention or other urinary change Musculoskeletal: Negative for other MSK pain or swelling Skin: Negative for color change or other new lesions Neurological: Negative for worsening tremors and other numbness  Psychiatric/Behavioral: Negative for worsening agitation or other fatigue All other system neg per pt    Objective:   Physical Exam BP 118/78   Pulse 73   Temp 98.5 F (36.9 C) (Oral)   Ht 5\' 10"  (1.778 m)   Wt 210 lb (95.3 kg)   SpO2 98%   BMI 30.13 kg/m  VS noted,  Constitutional: Pt appears in NAD HENT: Head: NCAT.  Right Ear: External ear normal.  Left Ear: External ear normal.  Eyes: . Pupils are equal, round, and reactive to light. Conjunctivae and EOM are normal Nose: without d/c or deformity Neck: Neck supple. Gross normal ROM Cardiovascular: Normal rate and regular rhythm.   Pulmonary/Chest: Effort normal and breath sounds without rales or wheezing.  Abd:  Soft, NT, ND, + BS, no organomegaly Neurological: Pt is alert. At baseline orientation, motor grossly intact Skin: Skin is warm. No rashes, other new lesions, no LE edema Psychiatric: Pt behavior  is normal without agitation  No other exam findings  Lab Results  Component Value Date   WBC 5.9 07/07/2017   HGB 13.6 07/21/2017   HCT 40.0 07/21/2017   PLT 154 07/07/2017   GLUCOSE 159 (H) 07/21/2017   CHOL 115 02/10/2017   TRIG 78 04/29/2017   HDL 39.80 02/10/2017   LDLCALC 65 02/10/2017   ALT 9 (L) 07/07/2017   AST 19 07/07/2017   NA 132 (L) 07/21/2017   K 5.9 (H) 07/21/2017   CL 95 (L) 07/07/2017   CREATININE 11.59 (H) 07/07/2017   BUN 57 (H) 07/07/2017   CO2 21 (L) 07/07/2017   TSH 1.469 04/30/2017   PSA 0.55 02/10/2017   INR 3.4 10/05/2017   HGBA1C 7.1 (H) 02/10/2017   MICROALBUR 220.0 (H) 08/06/2009  Contains abnormal data POCT glycosylated hemoglobin (Hb A1C)  Component 11:47  Hemoglobin A1C 6.8           Assessment & Plan:

## 2017-10-14 DIAGNOSIS — D631 Anemia in chronic kidney disease: Secondary | ICD-10-CM | POA: Diagnosis not present

## 2017-10-14 DIAGNOSIS — D509 Iron deficiency anemia, unspecified: Secondary | ICD-10-CM | POA: Diagnosis not present

## 2017-10-14 DIAGNOSIS — N186 End stage renal disease: Secondary | ICD-10-CM | POA: Diagnosis not present

## 2017-10-14 DIAGNOSIS — N2581 Secondary hyperparathyroidism of renal origin: Secondary | ICD-10-CM | POA: Diagnosis not present

## 2017-10-14 NOTE — Telephone Encounter (Signed)
Patient's number is not a working phone number. Sending patient a letter.

## 2017-10-14 NOTE — Telephone Encounter (Signed)
Please advise on hospital follow-up.

## 2017-10-14 NOTE — Telephone Encounter (Signed)
Follow up in one month.

## 2017-10-15 LAB — EKG 12-LEAD
Atrial Rate: 110 {beats}/min
Atrial Rate: 91 {beats}/min
Q-T Interval: 390 ms
Q-T Interval: 390 ms
QRS Duration: 110 ms
QRS Duration: 110 ms
QTc Calculation (Bazett): 441 ms
QTc Calculation (Bazett): 441 ms
R Axis: -87 degrees
R Axis: 74 degrees
T Axis: 0 degrees
T Axis: 72 degrees
Ventricular Rate: 77 {beats}/min
Ventricular Rate: 77 {beats}/min

## 2017-10-16 ENCOUNTER — Encounter: Payer: Self-pay | Admitting: Internal Medicine

## 2017-10-16 DIAGNOSIS — N2581 Secondary hyperparathyroidism of renal origin: Secondary | ICD-10-CM | POA: Diagnosis not present

## 2017-10-16 DIAGNOSIS — D631 Anemia in chronic kidney disease: Secondary | ICD-10-CM | POA: Diagnosis not present

## 2017-10-16 DIAGNOSIS — D509 Iron deficiency anemia, unspecified: Secondary | ICD-10-CM | POA: Diagnosis not present

## 2017-10-16 DIAGNOSIS — N186 End stage renal disease: Secondary | ICD-10-CM | POA: Diagnosis not present

## 2017-10-16 NOTE — Assessment & Plan Note (Signed)
stable overall by history and exam, recent data reviewed with pt, and pt to continue medical treatment as before,  to f/u any worsening symptoms or concerns Lab Results  Component Value Date   LDLCALC 65 02/10/2017

## 2017-10-16 NOTE — Assessment & Plan Note (Signed)
stable overall by history and exam, recent data reviewed with pt, and pt to continue medical treatment as before,  to f/u any worsening symptoms or concerns  

## 2017-10-19 DIAGNOSIS — N186 End stage renal disease: Secondary | ICD-10-CM | POA: Diagnosis not present

## 2017-10-19 DIAGNOSIS — D631 Anemia in chronic kidney disease: Secondary | ICD-10-CM | POA: Diagnosis not present

## 2017-10-19 DIAGNOSIS — D509 Iron deficiency anemia, unspecified: Secondary | ICD-10-CM | POA: Diagnosis not present

## 2017-10-19 DIAGNOSIS — N2581 Secondary hyperparathyroidism of renal origin: Secondary | ICD-10-CM | POA: Diagnosis not present

## 2017-10-21 DIAGNOSIS — N186 End stage renal disease: Secondary | ICD-10-CM | POA: Diagnosis not present

## 2017-10-21 DIAGNOSIS — D509 Iron deficiency anemia, unspecified: Secondary | ICD-10-CM | POA: Diagnosis not present

## 2017-10-21 DIAGNOSIS — D631 Anemia in chronic kidney disease: Secondary | ICD-10-CM | POA: Diagnosis not present

## 2017-10-21 DIAGNOSIS — I4891 Unspecified atrial fibrillation: Secondary | ICD-10-CM | POA: Diagnosis not present

## 2017-10-21 DIAGNOSIS — Z5181 Encounter for therapeutic drug level monitoring: Secondary | ICD-10-CM | POA: Diagnosis not present

## 2017-10-21 DIAGNOSIS — N2581 Secondary hyperparathyroidism of renal origin: Secondary | ICD-10-CM | POA: Diagnosis not present

## 2017-10-23 DIAGNOSIS — D631 Anemia in chronic kidney disease: Secondary | ICD-10-CM | POA: Diagnosis not present

## 2017-10-23 DIAGNOSIS — D509 Iron deficiency anemia, unspecified: Secondary | ICD-10-CM | POA: Diagnosis not present

## 2017-10-23 DIAGNOSIS — N186 End stage renal disease: Secondary | ICD-10-CM | POA: Diagnosis not present

## 2017-10-23 DIAGNOSIS — N2581 Secondary hyperparathyroidism of renal origin: Secondary | ICD-10-CM | POA: Diagnosis not present

## 2017-10-25 ENCOUNTER — Telehealth: Payer: Self-pay | Admitting: Internal Medicine

## 2017-10-25 ENCOUNTER — Encounter: Payer: Self-pay | Admitting: Internal Medicine

## 2017-10-25 ENCOUNTER — Ambulatory Visit (INDEPENDENT_AMBULATORY_CARE_PROVIDER_SITE_OTHER): Payer: Medicare Other | Admitting: Internal Medicine

## 2017-10-25 ENCOUNTER — Ambulatory Visit (INDEPENDENT_AMBULATORY_CARE_PROVIDER_SITE_OTHER): Payer: Medicare Other | Admitting: *Deleted

## 2017-10-25 VITALS — BP 130/88 | HR 85 | Ht 70.0 in | Wt 210.0 lb

## 2017-10-25 DIAGNOSIS — I4891 Unspecified atrial fibrillation: Secondary | ICD-10-CM

## 2017-10-25 DIAGNOSIS — N186 End stage renal disease: Secondary | ICD-10-CM | POA: Diagnosis not present

## 2017-10-25 DIAGNOSIS — I447 Left bundle-branch block, unspecified: Secondary | ICD-10-CM | POA: Diagnosis not present

## 2017-10-25 DIAGNOSIS — I5032 Chronic diastolic (congestive) heart failure: Secondary | ICD-10-CM | POA: Diagnosis not present

## 2017-10-25 DIAGNOSIS — E1122 Type 2 diabetes mellitus with diabetic chronic kidney disease: Secondary | ICD-10-CM | POA: Diagnosis not present

## 2017-10-25 DIAGNOSIS — I10 Essential (primary) hypertension: Secondary | ICD-10-CM

## 2017-10-25 DIAGNOSIS — Z8679 Personal history of other diseases of the circulatory system: Secondary | ICD-10-CM

## 2017-10-25 DIAGNOSIS — I442 Atrioventricular block, complete: Secondary | ICD-10-CM | POA: Diagnosis not present

## 2017-10-25 DIAGNOSIS — I480 Paroxysmal atrial fibrillation: Secondary | ICD-10-CM

## 2017-10-25 DIAGNOSIS — Z5181 Encounter for therapeutic drug level monitoring: Secondary | ICD-10-CM

## 2017-10-25 DIAGNOSIS — I639 Cerebral infarction, unspecified: Secondary | ICD-10-CM

## 2017-10-25 DIAGNOSIS — Z992 Dependence on renal dialysis: Secondary | ICD-10-CM | POA: Diagnosis not present

## 2017-10-25 LAB — POCT INR: INR: 2.8

## 2017-10-25 NOTE — Telephone Encounter (Signed)
rx Done hardcopy to [employer]

## 2017-10-25 NOTE — Progress Notes (Signed)
Cardiology Office Note   Date:  10/25/2017   ID:  Oscar Castillo, DOB Oct 02, 1950, MRN [id number]  PCP:  [name], MD  Cardiologist:   Dorris Carnes, MD   Follow up of chronic systolic / diastolic CHF , HTN and PAF   History of Present Illness: Oscar Castillo is a 67 y.o. male with a history of ESRD on hemodialysis, chronic combined systolic and diastolic CHF CVA, diabetes, HTN, hepatitis C, paroxysmal AF (on coumadin for CHADSVASC score of 7; started on amiodarone in March 2018),  PVOD(status post bilateral BKA's), and LBBB.  LV function was normal on echocardiogram in 2017. He had artifact on his Myoview that was explained by LBBB but no ischemia (2017).   Patient was evaluated in 2/18 for heme positive stools. EGD demonstrated esophagitis, gastritis/duodenitis. He was then admitted 3/3-3/9 with blood loss anemia in the setting of melena and suspected hematemesis. He required transfusion with PRBCs. Capsule endoscopy was reportedly unremarkable. Coumadin was resumed at discharge without Lovenox bridging.  The patient is also seen by Beckie Salts   Hx CHB  S/p PPM  He was seen in Feb  Since seen he has done OK   No  CP  No palpitatoins  No SOB     Wife says she thinks her husband aspirates    Current Meds  Medication Sig  . acetaminophen (TYLENOL) 500 MG tablet Take 500 mg by mouth every 6 (six) hours as needed for mild pain.  [name] amiodarone (PACERONE) 200 MG tablet TAKE 1 TABLET BY MOUTH DAILY, START THIS ON 11/06/16  . atorvastatin (LIPITOR) 40 MG tablet TAKE 1 TABLET(40 MG) BY MOUTH DAILY AT 6 PM  . calcium acetate (PHOSLO) 667 MG capsule Take 1,334 mg by mouth 3 (three) times daily with meals.   . gabapentin (NEURONTIN) 300 MG capsule Take 1 capsule (300 mg total) by mouth 3 (three) times daily. 3 times a day when necessary neuropathy pain (Patient taking differently: Take 300 mg by mouth 3 (three) times daily as needed (nerve pain). )  . latanoprost (XALATAN) 0.005 % ophthalmic solution  Place 1 drop into both eyes at bedtime.   . midodrine (PROAMATINE) 5 MG tablet TAKE 1 TABLET(5 MG) BY MOUTH TWICE DAILY WITH A MEAL  . oxyCODONE-acetaminophen (PERCOCET/ROXICET) 5-325 MG tablet Take 1 tablet by mouth every 6 (six) hours as needed.  . pantoprazole (PROTONIX) 40 MG tablet Take 1 tablet (40 mg total) by mouth 2 (two) times daily before a meal.  . pentoxifylline (TRENTAL) 400 MG CR tablet Take 1 tablet (400 mg total) by mouth 3 (three) times daily with meals.  [name] QUEtiapine (SEROQUEL) 25 MG tablet Take 0.5 tablets (12.5 mg total) by mouth at bedtime.  . senna (SENOKOT) 8.6 MG TABS tablet Take 1 tablet (8.6 mg total) by mouth daily.  . SENSIPAR 30 MG tablet Take 30 mg by mouth every evening.   . silver sulfADIAZINE (SSD) 1 % cream APPLY EXTERNALLY TO THE AFFECTED AREA DAILY  . sorbitol 70 % SOLN Take 30 mLs by mouth 2 (two) times daily. (Patient taking differently: Take 30 mLs by mouth 2 (two) times daily as needed for moderate constipation. )  . traMADol (ULTRAM) 50 MG tablet Take 50 mg by mouth every 6 (six) hours as needed for moderate pain.  [name] warfarin (COUMADIN) 2.5 MG tablet Take as directed by Anticoagulation Clinic (Patient taking differently: Take 2.5 mg by mouth once daily at night)     Allergies:  Ceftriaxone sodium in dextrose; Ciprofloxacin; Nsaids; Penicillins; and Sulfa antibiotics   Past Medical History:  Diagnosis Date  . Anemia   . Antral ulcer 2014   small  . BENIGN PROSTATIC HYPERTROPHY   . Bipolar disorder ([place])    "sometimes" (10/07/2016)  . CHOLELITHIASIS   . Chronic combined systolic and diastolic CHF (congestive heart failure) ([place])   . Complication of anesthesia    wife states pt had trouble waking up with in Nov., 2014  . CVA (cerebral vascular accident) ([place]) 07/2007   No residual effect  . DEPRESSION   . DIABETES MELLITUS, TYPE II    diet control.  No medication  since November 2015  . ERECTILE DYSFUNCTION   . ESRD on hemodialysis ([place])     ESRD due to DM/HTN. .  HD TTS at [facility] [address].  [name] GERD   . GI bleed    due to gastritis, discharged 10/02/16/notes 10/07/2016  . Hepatitis C    C - has been treated  . History of Clostridium difficile   . History of kidney stones   . HYPERTENSION   . LBBB (left bundle branch block)   . Morbid obesity ([place])   . PAF (paroxysmal atrial fibrillation) ([place])    a. Dx 12/2015.    Past Surgical History:  Procedure Laterality Date  . AMPUTATION Left 05/12/2013   Procedure: AMPUTATION RAY;  Surgeon: Newt Minion, MD;  Location: [place];  Service: Orthopedics;  Laterality: Left;  Left Foot 1st Ray Amputation  . AMPUTATION Left 06/09/2013   Procedure: AMPUTATION BELOW KNEE;  Surgeon: Newt Minion, MD;  Location: [place];  Service: Orthopedics;  Laterality: Left;  Left Below Knee Amputation and removal proximal screws IM tibial nail  . AMPUTATION Right 09/08/2013   Procedure: AMPUTATION BELOW KNEE;  Surgeon: Newt Minion, MD;  Location: [place];  Service: Orthopedics;  Laterality: Right;  Right Below Knee Amputation  . AMPUTATION Right 10/11/2013   Procedure: AMPUTATION BELOW KNEE;  Surgeon: Newt Minion, MD;  Location: [place];  Service: Orthopedics;  Laterality: Right;  Right Below Knee Amputation Revision  . AV FISTULA PLACEMENT  06/14/2012   Procedure: ARTERIOVENOUS (AV) FISTULA CREATION;  Surgeon: Angelia Mould, MD;  Location: [facility] OR;  Service: Vascular;  Laterality: Left;  Left basilic vein transposition with fistula.  . AV FISTULA PLACEMENT Left 07/21/2017   Procedure: Attempt INSERTION OF ARTERIOVENOUS (AV) WITH  ARTEGRAFT Left  UPPER ARM;  Surgeon: Conrad Mannsville, MD;  Location: [place];  Service: Vascular;  Laterality: Left;  . BASCILIC VEIN TRANSPOSITION Left 02/17/2017   Procedure: LEFT 1ST STAGE BRACHIAL VEIN TRANSPOSITION;  Surgeon: Conrad Rockwood, MD;  Location: [place];  Service: Vascular;  Laterality: Left;  . BASCILIC VEIN TRANSPOSITION Left 04/21/2017   Procedure: LEFT 2ND  STAGE BRACHIAL VEIN TRANSPOSITION;  Surgeon: Conrad Cassoday, MD;  Location: [place];  Service: Vascular;  Laterality: Left;  . COLONOSCOPY N/A 10/28/2012   Procedure: COLONOSCOPY;  Surgeon: Jeryl Columbia, MD;  Location: [facility] ENDOSCOPY;  Service: Endoscopy;  Laterality: N/A;  . COLONOSCOPY N/A 11/02/2012   Procedure: COLONOSCOPY;  Surgeon: Cleotis Nipper, MD;  Location: [facility] ENDOSCOPY;  Service: Endoscopy;  Laterality: N/A;  . COLONOSCOPY N/A 11/03/2012   Procedure: COLONOSCOPY;  Surgeon: Cleotis Nipper, MD;  Location: [facility] ENDOSCOPY;  Service: Endoscopy;  Laterality: N/A;  . COLONOSCOPY N/A 09/16/2016   Procedure: COLONOSCOPY;  Surgeon: Jerene Bears, MD;  Location: [facility]  ENDOS[facility];  Service: Gastroenterology;  Laterality: N/A;  . ENTEROSCOPY N/A 11/08/2012   Procedure: ENTEROSCOPY;  Surgeon: Wonda Horner, MD;  Location: [facility] ENDOSCOPY;  Service: Endoscopy;  Laterality: N/A;  . ESOPHAGOGASTRODUODENOSCOPY N/A 11/02/2012   Procedure: ESOPHAGOGASTRODUODENOSCOPY (EGD);  Surgeon: Cleotis Nipper, MD;  Location: [facility] ENDOSCOPY;  Service: Endoscopy;  Laterality: N/A;  . ESOPHAGOGASTRODUODENOSCOPY (EGD) WITH PROPOFOL N/A 09/16/2016   Procedure: ESOPHAGOGASTRODUODENOSCOPY (EGD) WITH PROPOFOL;  Surgeon: Jerene Bears, MD;  Location: [facility] ENDOSCOPY;  Service: Gastroenterology;  Laterality: N/A;  . EXCHANGE OF A DIALYSIS CATHETER Left 11/13/2016   Procedure: EXCHANGE OF A DIALYSIS CATHETER;  Surgeon: Elam Dutch, MD;  Location: [place];  Service: Vascular;  Laterality: Left;  . EYE SURGERY Left    to remove scar tissue  . GIVENS CAPSULE STUDY N/A 11/04/2012   Procedure: GIVENS CAPSULE STUDY;  Surgeon: Cleotis Nipper, MD;  Location: [facility] ENDOSCOPY;  Service: Endoscopy;  Laterality: N/A;  . GIVENS CAPSULE STUDY N/A 09/29/2016   Procedure: GIVENS CAPSULE STUDY;  Surgeon: Manus Gunning, MD;  Location: [place];  Service: Gastroenterology;  Laterality: N/A;  try to keep pt up in bedside chair as much as possible during the  study.    [name] HARDWARE REMOVAL Left 06/09/2013   Procedure: HARDWARE REMOVAL;  Surgeon: Newt Minion, MD;  Location: [place];  Service: Orthopedics;  Laterality: Left;  Left Below Knee Amputation  and Removal proximal screws IM tibial nail  . HEMATOMA EVACUATION Left 04/29/2017   Procedure: EVACUATION HEMATOMA LEFT ARM;  Surgeon: Conrad Emajagua, MD;  Location: [place];  Service: Vascular;  Laterality: Left;  . INSERTION OF DIALYSIS CATHETER N/A 11/09/2016   Procedure: INSERTION OF TEMPORARY DIALYSIS CATHETER;  Surgeon: Elam Dutch, MD;  Location: [place];  Service: Vascular;  Laterality: N/A;  . IR GENERIC HISTORICAL  09/27/2016   IR US GUIDE VASC ACCESS RIGHT 09/27/2016 Aletta Edouard, MD [facility]INTER[facility]  . IR GENERIC HISTORICAL  09/27/2016   IR FLUORO GUIDE CV LINE RIGHT 09/27/2016 Aletta Edouard, MD [facility]  . LIGATION OF ARTERIOVENOUS  FISTULA Left 11/09/2016   Procedure: LIGATION OF ARTERIOVENOUS  FISTULA;  Surgeon: Elam Dutch, MD;  Location: [place];  Service: Vascular;  Laterality: Left;  . ORIF FIBULA FRACTURE Left 09/09/2012   Procedure: OPEN REDUCTION INTERNAL FIXATION (ORIF) FIBULA FRACTURE;  Surgeon: Johnny Bridge, MD;  Location: [place];  Service: Orthopedics;  Laterality: Left;  . PACEMAKER IMPLANT N/A 04/30/2017   Procedure: PACEMAKER IMPLANT;  Surgeon: Evans Lance, MD;  Location: [place] CV LAB;  Service: Cardiovascular;  Laterality: N/A;  . TEMPORARY PACEMAKER N/A 04/29/2017   Procedure: TEMPORARY PACEMAKER;  Surgeon: Lorretta Harp, MD;  Location: [place] CV LAB;  Service: Cardiovascular;  Laterality: N/A;  . TIBIA IM NAIL INSERTION Left 09/09/2012   Procedure: INTRAMEDULLARY (IM) NAIL TIBIAL;  Surgeon: Johnny Bridge, MD;  Location: [place];  Service: Orthopedics;  Laterality: Left;  left tibial nail and open reduction internal fixation left fibula fracture  . UPPER EXTREMITY VENOGRAPHY N/A 12/14/2016   Procedure: Bilateral Upper Extremity Venography and Central  Venography;  Surgeon: Conrad San [place], MD;  Location: [place] CV LAB;  Service: Cardiovascular;  Laterality: N/A;     Social History:  The patient  reports that he has never smoked. He has never used smokeless tobacco. He reports that he does not drink alcohol or use drugs.   Family History:  The patient's family history includes  Coronary artery disease in his other; Diabetes in his mother; Heart attack in his father; Hypertension in his father and mother.    ROS:  Please see the history of present illness. All other systems are reviewed and  Negative to the above problem except as noted.    PHYSICAL EXAM: VS:  BP 130/88   Pulse 85   Ht 5\' 10"  (1.778 m)   Wt 210 lb (95.3 kg)   SpO2 96%   BMI 30.13 kg/m   GEN: Well nourished, well developed, in no acute distress   Examined in wheelchair HEENT: normal  Neck: JVP normal   NO carotid bruits, or masses Cardiac: RRR; no murmurs, rubs, or gallops,No edema of thigh Respiratory:  clear to auscultation bilaterally, normal work of breathing GI: soft, nontender, nondistended, + BS  No hepatomegaly  MS: s/p bilateral BKA   Skin: warm and dry, no rash Psych: euthymic mood, full affect   EKG:  EKG is not ordered today.   Lipid Panel    Component Value Date/Time   CHOL 115 02/10/2017 1717   TRIG 78 04/29/2017 2009   HDL 39.80 02/10/2017 1717   CHOLHDL 3 02/10/2017 1717   VLDL 9.6 02/10/2017 1717   LDLCALC 65 02/10/2017 1717      Wt Readings from Last 3 Encounters:  10/25/17 210 lb (95.3 kg)  10/13/17 210 lb (95.3 kg)  08/23/17 205 lb (93 kg)      ASSESSMENT AND PLAN:  1  Chronic diastolic CHF  Volume is not bad  Managed with dialysis    2  HTN  BP is pretty good  I would keep on same regmien  3  PAF  Cliically in SR  Amiodarone was started in March 2018 I have reviewed with Beckie Salts  Can take 200 6x per wk   Hold Sundays  Follow for 1 year then if still holding back down to 5x per wk  4  LBBB  Chronic   6  PVOD    7   HL  Continue Lipitor     Current medicines are reviewed at length with the patient today.  The patient does not have concerns regarding medicines.  Signed, Dorris Carnes, MD  10/25/2017 8:43 AM    [address], [place], [place], [place]  [address] Phone: [phone]; Fax: [phone]

## 2017-10-25 NOTE — Patient Instructions (Signed)
Description   Continue  taking 1 tablet daily except 1/2 tablet on Saturdays.  Recheck INR in 3 weeks.  Call us with any medication changes or concerns # [phone] [facility], # [phone] Main number.

## 2017-10-25 NOTE — Patient Instructions (Signed)
Your physician recommends that you continue on your current medications as directed. Please refer to the Current Medication list given to you today. Your physician wants you to follow-up in: 1 year with Dr. Ross.  You will receive a reminder letter in the mail two months in advance. If you don't receive a letter, please call our office to schedule the follow-up appointment.  

## 2017-10-25 NOTE — Telephone Encounter (Signed)
Copied from [place] [phone]. Topic: Quick Communication - See Telephone Encounter >> Oct 25, 2017  2:53 PM Conception Chancy, NT wrote: CRM for notification. See Telephone encounter for: 10/25/17.  Denice Paradise is calling from [facility] care is trying to get a order for a small hoyer lift, since there house is small. Please advise and contact Denice Paradise the status of this.   She states that [facility] can fill the form out as long as they have clinical documentation, prescription, and demographics.   Fax [phone]  Denice Paradise Cb# [phone]

## 2017-10-26 DIAGNOSIS — D689 Coagulation defect, unspecified: Secondary | ICD-10-CM | POA: Diagnosis not present

## 2017-10-26 DIAGNOSIS — Z992 Dependence on renal dialysis: Secondary | ICD-10-CM | POA: Diagnosis not present

## 2017-10-26 DIAGNOSIS — D509 Iron deficiency anemia, unspecified: Secondary | ICD-10-CM | POA: Diagnosis not present

## 2017-10-26 DIAGNOSIS — E1122 Type 2 diabetes mellitus with diabetic chronic kidney disease: Secondary | ICD-10-CM | POA: Diagnosis not present

## 2017-10-26 DIAGNOSIS — N186 End stage renal disease: Secondary | ICD-10-CM | POA: Diagnosis not present

## 2017-10-26 DIAGNOSIS — D631 Anemia in chronic kidney disease: Secondary | ICD-10-CM | POA: Diagnosis not present

## 2017-10-26 DIAGNOSIS — N2581 Secondary hyperparathyroidism of renal origin: Secondary | ICD-10-CM | POA: Diagnosis not present

## 2017-10-26 NOTE — Telephone Encounter (Signed)
Faxed to [facility]  

## 2017-10-28 DIAGNOSIS — D689 Coagulation defect, unspecified: Secondary | ICD-10-CM | POA: Diagnosis not present

## 2017-10-28 DIAGNOSIS — D631 Anemia in chronic kidney disease: Secondary | ICD-10-CM | POA: Diagnosis not present

## 2017-10-28 DIAGNOSIS — N186 End stage renal disease: Secondary | ICD-10-CM | POA: Diagnosis not present

## 2017-10-28 DIAGNOSIS — N2581 Secondary hyperparathyroidism of renal origin: Secondary | ICD-10-CM | POA: Diagnosis not present

## 2017-10-28 DIAGNOSIS — E1122 Type 2 diabetes mellitus with diabetic chronic kidney disease: Secondary | ICD-10-CM | POA: Diagnosis not present

## 2017-10-28 DIAGNOSIS — D509 Iron deficiency anemia, unspecified: Secondary | ICD-10-CM | POA: Diagnosis not present

## 2017-10-30 DIAGNOSIS — E1122 Type 2 diabetes mellitus with diabetic chronic kidney disease: Secondary | ICD-10-CM | POA: Diagnosis not present

## 2017-10-30 DIAGNOSIS — N2581 Secondary hyperparathyroidism of renal origin: Secondary | ICD-10-CM | POA: Diagnosis not present

## 2017-10-30 DIAGNOSIS — D509 Iron deficiency anemia, unspecified: Secondary | ICD-10-CM | POA: Diagnosis not present

## 2017-10-30 DIAGNOSIS — D689 Coagulation defect, unspecified: Secondary | ICD-10-CM | POA: Diagnosis not present

## 2017-10-30 DIAGNOSIS — N186 End stage renal disease: Secondary | ICD-10-CM | POA: Diagnosis not present

## 2017-10-30 DIAGNOSIS — D631 Anemia in chronic kidney disease: Secondary | ICD-10-CM | POA: Diagnosis not present

## 2017-11-02 ENCOUNTER — Telehealth: Payer: Self-pay | Admitting: Internal Medicine

## 2017-11-02 DIAGNOSIS — N2581 Secondary hyperparathyroidism of renal origin: Secondary | ICD-10-CM | POA: Diagnosis not present

## 2017-11-02 DIAGNOSIS — D689 Coagulation defect, unspecified: Secondary | ICD-10-CM | POA: Diagnosis not present

## 2017-11-02 DIAGNOSIS — D631 Anemia in chronic kidney disease: Secondary | ICD-10-CM | POA: Diagnosis not present

## 2017-11-02 DIAGNOSIS — E1122 Type 2 diabetes mellitus with diabetic chronic kidney disease: Secondary | ICD-10-CM | POA: Diagnosis not present

## 2017-11-02 DIAGNOSIS — D509 Iron deficiency anemia, unspecified: Secondary | ICD-10-CM | POA: Diagnosis not present

## 2017-11-02 DIAGNOSIS — N186 End stage renal disease: Secondary | ICD-10-CM | POA: Diagnosis not present

## 2017-11-02 NOTE — Telephone Encounter (Signed)
Since proamatine does not cause BP to go lower, and in fact is used for patients that need higher BP, this means that the medication as taken may not be effective enough  Please have pt contact Dr Harrington Challenger office, as I believe she may be best suited to make any recommendations. Thanks

## 2017-11-02 NOTE — Telephone Encounter (Signed)
Copied from [place] [phone]. Topic: Quick Communication - See Telephone Encounter >> Nov 02, 2017  9:16 AM Antonieta Iba C wrote: CRM for notification. See Telephone encounter for: 11/02/17.  Pt's spouse called in because she stated that medication midodrine (PROAMATINE) 5 MG tablet causes pt's bp to drop. Last night his reading was 75/54. They would like to be advise further.    CB: [phone]

## 2017-11-02 NOTE — Telephone Encounter (Signed)
Please advise 

## 2017-11-02 NOTE — Telephone Encounter (Signed)
Pt's wife has been informed and expressed understanding. 

## 2017-11-04 DIAGNOSIS — N2581 Secondary hyperparathyroidism of renal origin: Secondary | ICD-10-CM | POA: Diagnosis not present

## 2017-11-04 DIAGNOSIS — N186 End stage renal disease: Secondary | ICD-10-CM | POA: Diagnosis not present

## 2017-11-04 DIAGNOSIS — E1122 Type 2 diabetes mellitus with diabetic chronic kidney disease: Secondary | ICD-10-CM | POA: Diagnosis not present

## 2017-11-04 DIAGNOSIS — D509 Iron deficiency anemia, unspecified: Secondary | ICD-10-CM | POA: Diagnosis not present

## 2017-11-04 DIAGNOSIS — D689 Coagulation defect, unspecified: Secondary | ICD-10-CM | POA: Diagnosis not present

## 2017-11-04 DIAGNOSIS — D631 Anemia in chronic kidney disease: Secondary | ICD-10-CM | POA: Diagnosis not present

## 2017-11-06 DIAGNOSIS — E1122 Type 2 diabetes mellitus with diabetic chronic kidney disease: Secondary | ICD-10-CM | POA: Diagnosis not present

## 2017-11-06 DIAGNOSIS — D689 Coagulation defect, unspecified: Secondary | ICD-10-CM | POA: Diagnosis not present

## 2017-11-06 DIAGNOSIS — D631 Anemia in chronic kidney disease: Secondary | ICD-10-CM | POA: Diagnosis not present

## 2017-11-06 DIAGNOSIS — N2581 Secondary hyperparathyroidism of renal origin: Secondary | ICD-10-CM | POA: Diagnosis not present

## 2017-11-06 DIAGNOSIS — N186 End stage renal disease: Secondary | ICD-10-CM | POA: Diagnosis not present

## 2017-11-06 DIAGNOSIS — D509 Iron deficiency anemia, unspecified: Secondary | ICD-10-CM | POA: Diagnosis not present

## 2017-11-08 ENCOUNTER — Encounter (INDEPENDENT_AMBULATORY_CARE_PROVIDER_SITE_OTHER): Payer: Self-pay | Admitting: Orthopedic Surgery

## 2017-11-08 ENCOUNTER — Ambulatory Visit (INDEPENDENT_AMBULATORY_CARE_PROVIDER_SITE_OTHER): Payer: Medicare Other | Admitting: Orthopedic Surgery

## 2017-11-08 DIAGNOSIS — I639 Cerebral infarction, unspecified: Secondary | ICD-10-CM | POA: Diagnosis not present

## 2017-11-08 DIAGNOSIS — L97221 Non-pressure chronic ulcer of left calf limited to breakdown of skin: Secondary | ICD-10-CM

## 2017-11-08 DIAGNOSIS — Z89512 Acquired absence of left leg below knee: Secondary | ICD-10-CM | POA: Diagnosis not present

## 2017-11-08 DIAGNOSIS — Z89511 Acquired absence of right leg below knee: Secondary | ICD-10-CM | POA: Diagnosis not present

## 2017-11-08 NOTE — Progress Notes (Signed)
Office Visit Note   Patient: Oscar Castillo           Date of Birth: 01-31-1951           MRN: [id number] Visit Date: 11/08/2017              Requested by: [name], MD [place], [place] [address] PCP: [name], MD  Chief Complaint  Patient presents with  . Right Leg - Pain      HPI: Patient is a 67 year old gentleman bilateral transtibial amputee with flexion contractures of both knees who presents with an N bearing ulcer on his left transtibial amputation as well as a scab on the lateral aspect of the right patella.  Assessment & Plan: Visit Diagnoses:  1. S/P BKA (below knee amputation) bilateral ([place])   2. Non-pressure chronic ulcer of left calf limited to breakdown of skin ([place])     Plan: We will give the patient a prescription for [employer] for a stump protector to unload pressure on the left leg.  Appears like the pressure is from patient rubbing the end of his leg while he is in bed or in a chair.  We will need to get the pressure off this area for the heel.  Patient still has global weakness he could not support himself on the parallel bars he will not use the right prosthesis until the scab has resolved on the right.  Follow-Up Instructions: Return in about 1 month (around 12/06/2017).   Ortho Exam  Patient is alert, oriented, no adenopathy, well-dressed, normal affect, normal respiratory effort. Examination patient ambulates in a wheelchair.  He has an N bearing ulcer on the left leg with flexion contracture of the knee appears this is due to patient rubbing the leg while in bed or in a chair causing the N bearing ulcer.  The ulcer is 10 mm in diameter 1 mm deep with healthy granulation tissue there is no exposed bone or tendon.  Examination of her right knee is also flexion contracture with a scab over the lateral aspect of the patella secondary to weightbearing from his prosthesis.  Imaging: No results found. No images are attached to the  encounter.  Labs: Lab Results  Component Value Date   HGBA1C 6.8 10/13/2017   HGBA1C 7.1 (H) 02/10/2017   HGBA1C 6.4 (H) 09/15/2016   ESRSEDRATE 27 (H) 05/09/2013   CRP 6.7 (H) 05/09/2013   REPTSTATUS 07/12/2017 FINAL 07/07/2017   GRAMSTAIN  11/09/2016    FEW WBC PRESENT,BOTH PMN AND MONONUCLEAR NO ORGANISMS SEEN    CULT NO GROWTH 5 DAYS 07/07/2017   LABORGA STAPHYLOCOCCUS AUREUS 11/09/2016    @LABSALLVALUES (HGBA1)@  There is no height or weight on file to calculate BMI.  Orders:  No orders of the defined types were placed in this encounter.  No orders of the defined types were placed in this encounter.    Procedures: No procedures performed  Clinical Data: No additional findings.  ROS:  All other systems negative, except as noted in the HPI. Review of Systems  Objective: Vital Signs: There were no vitals taken for this visit.  Specialty Comments:  No specialty comments available.  PMFS History: Patient Active Problem List   Diagnosis Date Noted  . Chronic pain 09/07/2017  . Non-pressure chronic ulcer of left calf limited to breakdown of skin ([place]) 07/30/2017  . Dysphagia 07/10/2017  . Sacral ulcer ([place]) 05/07/2017  . CHB (complete heart block) ([place]) 04/29/2017  .  Heart block AV third degree ([place]) 04/29/2017  . Complete heart block ([place])   . PAD (peripheral artery disease) ([place])   . General weakness 02/10/2017  . Gait disorder 02/10/2017  . Chronic neck pain 12/14/2016  . MSSA bacteremia   . Sepsis ([place]) 11/08/2016  . Encounter for therapeutic drug monitoring 10/07/2016  . Diabetes mellitus with complication ([place])   . Anemia due to stage 5 chronic kidney disease ([place])   . Hypotension   . Melena 09/26/2016  . Pressure injury of skin 09/26/2016  . Benign neoplasm of descending colon   . Gastroesophageal reflux disease with esophagitis   . Duodenal ulcer without hemorrhage or perforation   . Blood loss anemia 09/15/2016  . Paroxysmal atrial  fibrillation ([place]) 09/15/2016  . Heme positive stool 09/15/2016  . RUQ pain 07/12/2016  . Hyperkalemia 07/01/2016  . Mixed hyperlipidemia 03/13/2016  . Anemia 12/31/2015  . Glaucoma 12/27/2015  . LBBB (left bundle branch block)   . Rash 11/15/2015  . Cramping of hands 11/15/2015  . ESRD on dialysis ([place]) 10/14/2015  . Liver fibrosis 10/07/2015  . Cigarette nicotine dependence without complication 32/95/1884  . Obesity (BMI 30-39.9) 06/06/2015  . Chronic bilateral low back pain without sciatica 06/06/2015  . Organ transplant candidate 05/17/2015  . DM (diabetes mellitus) with peripheral vascular complication ([place]) 16/60/6301  . Insulin-dependent diabetes mellitus with proliferative retinopathy ([place]) 05/17/2015  . Chronic bronchitis ([place]) 05/10/2015  . History of alcoholism ([place]) 05/10/2015  . History of peptic ulcer 05/10/2015  . Diarrhea 08/14/2014  . Dehydration 10/02/2013  . Fever 10/02/2013  . Altered mental status 10/01/2013  . Encephalopathy, toxic 10/01/2013  . Encephalopathy, metabolic 60/04/9322  . History of stroke 09/28/2013  . FTT (failure to thrive) in adult 09/28/2013  . Acute confusional state 09/28/2013  . Ulcer of sacral region, stage 3 ([place]) 09/26/2013  . S/P BKA (below knee amputation) bilateral ([place]) 09/08/2013  . ESRD on hemodialysis ([place]) 05/09/2013  . Cerebrovascular accident ([place]) 02/20/2013  . Acute blood loss anemia 10/28/2012  . Acute on chronic combined systolic and diastolic CHF (congestive heart failure) ([place]) 10/28/2012  . Constipation 10/28/2012  . Chronic combined systolic and diastolic heart failure ([place]) 10/28/2012  . GI bleed 10/27/2012  . Mass in rectum 10/27/2012  . Type 2 diabetes mellitus with chronic kidney disease on chronic dialysis, with long-term current use of insulin ([place]) 09/08/2012  . Essential (primary) hypertension 09/08/2012  . LVH (left ventricular hypertrophy)-severe concentric 06/13/2012  . Anemia in other chronic  diseases classified elsewhere 06/12/2012  . Hyperlipidemia, unspecified 01/30/2011  . CHOLELITHIASIS 08/01/2010  . BENIGN PROSTATIC HYPERTROPHY 08/01/2010  . History of cardiovascular disorder 08/06/2009  . Depression 03/18/2009  . NEPHROLITHIASIS, HX OF 03/18/2009  . Major depressive disorder, single episode, unspecified 03/18/2009  . Morbid obesity ([place]) 03/25/2007  . Hypertensive heart disease with CHF (congestive heart failure) ([place]) 03/25/2007  . GERD (gastroesophageal reflux disease) 03/25/2007  . Chronic hepatitis C without hepatic coma ([place]) 03/25/2007   Past Medical History:  Diagnosis Date  . Anemia   . Antral ulcer 2014   small  . BENIGN PROSTATIC HYPERTROPHY   . Bipolar disorder ([place])    "sometimes" (10/07/2016)  . CHOLELITHIASIS   . Chronic combined systolic and diastolic CHF (congestive heart failure) ([place])   . Complication of anesthesia    wife states pt had trouble waking up with in Nov., 2014  . CVA (cerebral vascular accident) ([place]) 07/2007   No residual effect  . DEPRESSION   .  DIABETES MELLITUS, TYPE II    diet control.  No medication  since November 2015  . ERECTILE DYSFUNCTION   . ESRD on hemodialysis ([place])    ESRD due to DM/HTN. .  HD TTS at [facility] on [address].  [name] GERD   . GI bleed    due to gastritis, discharged 10/02/16/notes 10/07/2016  . Hepatitis C    C - has been treated  . History of Clostridium difficile   . History of kidney stones   . HYPERTENSION   . LBBB (left bundle branch block)   . Morbid obesity ([place])   . PAF (paroxysmal atrial fibrillation) ([place])    a. Dx 12/2015.    Family History  Problem Relation Age of Onset  . Diabetes Mother   . Hypertension Mother   . Heart attack Father   . Hypertension Father   . Coronary artery disease Other     Past Surgical History:  Procedure Laterality Date  . AMPUTATION Left 05/12/2013   Procedure: AMPUTATION RAY;  Surgeon: Newt Minion, MD;  Location: [facility];  Service: Orthopedics;   Laterality: Left;  Left Foot 1st Ray Amputation  . AMPUTATION Left 06/09/2013   Procedure: AMPUTATION BELOW KNEE;  Surgeon: Newt Minion, MD;  Location: [facility];  Service: Orthopedics;  Laterality: Left;  Left Below Knee Amputation and removal proximal screws IM tibial nail  . AMPUTATION Right 09/08/2013   Procedure: AMPUTATION BELOW KNEE;  Surgeon: Newt Minion, MD;  Location: [facility];  Service: Orthopedics;  Laterality: Right;  Right Below Knee Amputation  . AMPUTATION Right 10/11/2013   Procedure: AMPUTATION BELOW KNEE;  Surgeon: Newt Minion, MD;  Location: [facility];  Service: Orthopedics;  Laterality: Right;  Right Below Knee Amputation Revision  . AV FISTULA PLACEMENT  06/14/2012   Procedure: ARTERIOVENOUS (AV) FISTULA CREATION;  Surgeon: Angelia Mould, MD;  [facility];  Service: Vascular;  Laterality: Left;  Left basilic vein transposition with fistula.  . AV FISTULA PLACEMENT Left 07/21/2017   Procedure: Attempt INSERTION OF ARTERIOVENOUS (AV) WITH  ARTEGRAFT Left  UPPER ARM;  Surgeon: Conrad Ramtown, MD;  Location: [facility];  Service: Vascular;  Laterality: Left;  . BASCILIC VEIN TRANSPOSITION Left 02/17/2017   Procedure: LEFT 1ST STAGE BRACHIAL VEIN TRANSPOSITION;  Surgeon: Conrad [place], MD;  Location: [facility];  Service: Vascular;  Laterality: Left;  . BASCILIC VEIN TRANSPOSITION Left 04/21/2017   Procedure: LEFT 2ND STAGE BRACHIAL VEIN TRANSPOSITION;  Surgeon: Conrad [place], MD;  Location: [facility];  Service: Vascular;  Laterality: Left;  . COLONOSCOPY N/A 10/28/2012   Procedure: COLONOSCOPY;  Surgeon: Jeryl Columbia, MD;  Location: [facility] ENDOSCOPY;  Service: Endoscopy;  Laterality: N/A;  . COLONOSCOPY N/A 11/02/2012   Procedure: COLONOSCOPY;  Surgeon: Cleotis Nipper, MD;  Location: [facility] ENDOSCOPY;  Service: Endoscopy;  Laterality: N/A;  . COLONOSCOPY N/A 11/03/2012   Procedure: COLONOSCOPY;  Surgeon: Cleotis Nipper, MD;  Location: [facility] ENDOSCOPY;  Service: Endoscopy;  Laterality: N/A;  .  COLONOSCOPY N/A 09/16/2016   Procedure: COLONOSCOPY;  Surgeon: Jerene Bears, MD;  Location: [facility] ENDOSCOPY;  Service: Gastroenterology;  Laterality: N/A;  . ENTEROSCOPY N/A 11/08/2012   Procedure: ENTEROSCOPY;  Surgeon: Wonda Horner, MD;  Location: [facility] ENDOSCOPY;  Service: Endoscopy;  Laterality: N/A;  . ESOPHAGOGASTRODUODENOSCOPY N/A 11/02/2012   Procedure: ESOPHAGOGASTRODUODENOSCOPY (EGD);  Surgeon: Cleotis Nipper, MD;  Location: [facility] ENDOSCOPY;  Service: Endoscopy;  Laterality: N/A;  . ESOPHAGOGASTRODUODENOSCOPY (EGD)  WITH PROPOFOL N/A 09/16/2016   Procedure: ESOPHAGOGASTRODUODENOSCOPY (EGD) WITH PROPOFOL;  Surgeon: Jerene Bears, MD;  Location: [facility] ENDOSCOPY;  Service: Gastroenterology;  Laterality: N/A;  . EXCHANGE OF A DIALYSIS CATHETER Left 11/13/2016   Procedure: EXCHANGE OF A DIALYSIS CATHETER;  Surgeon: Elam Dutch, MD;  Location: [facility];  Service: Vascular;  Laterality: Left;  . EYE SURGERY Left    to remove scar tissue  . GIVENS CAPSULE STUDY N/A 11/04/2012   Procedure: GIVENS CAPSULE STUDY;  Surgeon: Cleotis Nipper, MD;  Location: [facility] ENDOSCOPY;  Service: Endoscopy;  Laterality: N/A;  . GIVENS CAPSULE STUDY N/A 09/29/2016   Procedure: GIVENS CAPSULE STUDY;  Surgeon: Manus Gunning, MD;  Location: [facility];  Service: Gastroenterology;  Laterality: N/A;  try to keep pt up in bedside chair as much as possible during the study.    [name] HARDWARE REMOVAL Left 06/09/2013   Procedure: HARDWARE REMOVAL;  Surgeon: Newt Minion, MD;  Location: [place];  Service: Orthopedics;  Laterality: Left;  Left Below Knee Amputation  and Removal proximal screws IM tibial nail  . HEMATOMA EVACUATION Left 04/29/2017   Procedure: EVACUATION HEMATOMA LEFT ARM;  Surgeon: Conrad Carleton, MD;  Location: [facility];  Service: Vascular;  Laterality: Left;  . INSERTION OF DIALYSIS CATHETER N/A 11/09/2016   Procedure: INSERTION OF TEMPORARY DIALYSIS CATHETER;  Surgeon: Elam Dutch, MD;  Location: [facility];  Service:  Vascular;  Laterality: N/A;  . IR GENERIC HISTORICAL  09/27/2016   IR US GUIDE VASC ACCESS RIGHT 09/27/2016 Aletta Edouard, MD [facility]  . IR GENERIC HISTORICAL  09/27/2016   IR FLUORO GUIDE CV LINE RIGHT 09/27/2016 Aletta Edouard, MD [facility]  . LIGATION OF ARTERIOVENOUS  FISTULA Left 11/09/2016   Procedure: LIGATION OF ARTERIOVENOUS  FISTULA;  Surgeon: Elam Dutch, MD;  Location: [facility];  Service: Vascular;  Laterality: Left;  . ORIF FIBULA FRACTURE Left 09/09/2012   Procedure: OPEN REDUCTION INTERNAL FIXATION (ORIF) FIBULA FRACTURE;  Surgeon: Johnny Bridge, MD;  Location: [facility];  Service: Orthopedics;  Laterality: Left;  . PACEMAKER IMPLANT N/A 04/30/2017   Procedure: PACEMAKER IMPLANT;  Surgeon: Evans Lance, MD;  Location: [facility] CV LAB;  Service: Cardiovascular;  Laterality: N/A;  . TEMPORARY PACEMAKER N/A 04/29/2017   Procedure: TEMPORARY PACEMAKER;  Surgeon: Lorretta Harp, MD;  Location: [place] CV LAB;  Service: Cardiovascular;  Laterality: N/A;  . TIBIA IM NAIL INSERTION Left 09/09/2012   Procedure: INTRAMEDULLARY (IM) NAIL TIBIAL;  Surgeon: Johnny Bridge, MD;  Location: [facility];  Service: Orthopedics;  Laterality: Left;  left tibial nail and open reduction internal fixation left fibula fracture  . UPPER EXTREMITY VENOGRAPHY N/A 12/14/2016   Procedure: Bilateral Upper Extremity Venography and Central Venography;  Surgeon: Conrad [place], MD;  Location: [place] CV LAB;  Service: Cardiovascular;  Laterality: N/A;   Social History   Occupational History  . Occupation: disabled due to stroke    Employer: RETIRED  Tobacco Use  . Smoking status: Never Smoker  . Smokeless tobacco: Never Used  Substance and Sexual Activity  . Alcohol use: No  . Drug use: No  . Sexual activity: Yes    Birth control/protection: None

## 2017-11-09 DIAGNOSIS — N2581 Secondary hyperparathyroidism of renal origin: Secondary | ICD-10-CM | POA: Diagnosis not present

## 2017-11-09 DIAGNOSIS — D509 Iron deficiency anemia, unspecified: Secondary | ICD-10-CM | POA: Diagnosis not present

## 2017-11-09 DIAGNOSIS — N186 End stage renal disease: Secondary | ICD-10-CM | POA: Diagnosis not present

## 2017-11-09 DIAGNOSIS — D631 Anemia in chronic kidney disease: Secondary | ICD-10-CM | POA: Diagnosis not present

## 2017-11-09 DIAGNOSIS — E1122 Type 2 diabetes mellitus with diabetic chronic kidney disease: Secondary | ICD-10-CM | POA: Diagnosis not present

## 2017-11-09 DIAGNOSIS — D689 Coagulation defect, unspecified: Secondary | ICD-10-CM | POA: Diagnosis not present

## 2017-11-11 DIAGNOSIS — D689 Coagulation defect, unspecified: Secondary | ICD-10-CM | POA: Diagnosis not present

## 2017-11-11 DIAGNOSIS — N2581 Secondary hyperparathyroidism of renal origin: Secondary | ICD-10-CM | POA: Diagnosis not present

## 2017-11-11 DIAGNOSIS — D509 Iron deficiency anemia, unspecified: Secondary | ICD-10-CM | POA: Diagnosis not present

## 2017-11-11 DIAGNOSIS — D631 Anemia in chronic kidney disease: Secondary | ICD-10-CM | POA: Diagnosis not present

## 2017-11-11 DIAGNOSIS — E1122 Type 2 diabetes mellitus with diabetic chronic kidney disease: Secondary | ICD-10-CM | POA: Diagnosis not present

## 2017-11-11 DIAGNOSIS — N186 End stage renal disease: Secondary | ICD-10-CM | POA: Diagnosis not present

## 2017-11-13 DIAGNOSIS — N186 End stage renal disease: Secondary | ICD-10-CM | POA: Diagnosis not present

## 2017-11-13 DIAGNOSIS — N2581 Secondary hyperparathyroidism of renal origin: Secondary | ICD-10-CM | POA: Diagnosis not present

## 2017-11-13 DIAGNOSIS — D689 Coagulation defect, unspecified: Secondary | ICD-10-CM | POA: Diagnosis not present

## 2017-11-13 DIAGNOSIS — D631 Anemia in chronic kidney disease: Secondary | ICD-10-CM | POA: Diagnosis not present

## 2017-11-13 DIAGNOSIS — E1122 Type 2 diabetes mellitus with diabetic chronic kidney disease: Secondary | ICD-10-CM | POA: Diagnosis not present

## 2017-11-13 DIAGNOSIS — D509 Iron deficiency anemia, unspecified: Secondary | ICD-10-CM | POA: Diagnosis not present

## 2017-11-15 ENCOUNTER — Ambulatory Visit (INDEPENDENT_AMBULATORY_CARE_PROVIDER_SITE_OTHER): Payer: Medicare Other | Admitting: *Deleted

## 2017-11-15 ENCOUNTER — Other Ambulatory Visit: Payer: Self-pay | Admitting: Internal Medicine

## 2017-11-15 DIAGNOSIS — I4891 Unspecified atrial fibrillation: Secondary | ICD-10-CM | POA: Diagnosis not present

## 2017-11-15 DIAGNOSIS — I639 Cerebral infarction, unspecified: Secondary | ICD-10-CM

## 2017-11-15 DIAGNOSIS — Z5181 Encounter for therapeutic drug level monitoring: Secondary | ICD-10-CM | POA: Diagnosis not present

## 2017-11-15 DIAGNOSIS — Z8679 Personal history of other diseases of the circulatory system: Secondary | ICD-10-CM

## 2017-11-15 LAB — POCT INR: INR: 3.8

## 2017-11-15 NOTE — Patient Instructions (Signed)
Description   Do not take any Coumadin today then continue  taking 1 tablet daily except 1/2 tablet on Saturdays.  Recheck INR in 3 weeks.  Call us with any medication changes or concerns # [phone] [facility], # [phone] Main number.

## 2017-11-16 ENCOUNTER — Other Ambulatory Visit: Payer: Self-pay | Admitting: *Deleted

## 2017-11-16 DIAGNOSIS — E1122 Type 2 diabetes mellitus with diabetic chronic kidney disease: Secondary | ICD-10-CM | POA: Diagnosis not present

## 2017-11-16 DIAGNOSIS — D631 Anemia in chronic kidney disease: Secondary | ICD-10-CM | POA: Diagnosis not present

## 2017-11-16 DIAGNOSIS — N186 End stage renal disease: Secondary | ICD-10-CM | POA: Diagnosis not present

## 2017-11-16 DIAGNOSIS — D509 Iron deficiency anemia, unspecified: Secondary | ICD-10-CM | POA: Diagnosis not present

## 2017-11-16 DIAGNOSIS — N2581 Secondary hyperparathyroidism of renal origin: Secondary | ICD-10-CM | POA: Diagnosis not present

## 2017-11-16 DIAGNOSIS — D689 Coagulation defect, unspecified: Secondary | ICD-10-CM | POA: Diagnosis not present

## 2017-11-16 MED ORDER — AMIODARONE HCL 200 MG PO TABS: 200.0000 mg | ORAL_TABLET | Freq: Every day | ORAL | 3 refills | Status: DC

## 2017-11-16 NOTE — Telephone Encounter (Signed)
Rx has been sent to the pharmacy electronically. ° °

## 2017-11-18 DIAGNOSIS — Z5181 Encounter for therapeutic drug level monitoring: Secondary | ICD-10-CM | POA: Diagnosis not present

## 2017-11-18 DIAGNOSIS — N2581 Secondary hyperparathyroidism of renal origin: Secondary | ICD-10-CM | POA: Diagnosis not present

## 2017-11-18 DIAGNOSIS — E1122 Type 2 diabetes mellitus with diabetic chronic kidney disease: Secondary | ICD-10-CM | POA: Diagnosis not present

## 2017-11-18 DIAGNOSIS — D631 Anemia in chronic kidney disease: Secondary | ICD-10-CM | POA: Diagnosis not present

## 2017-11-18 DIAGNOSIS — I4891 Unspecified atrial fibrillation: Secondary | ICD-10-CM | POA: Diagnosis not present

## 2017-11-18 DIAGNOSIS — N186 End stage renal disease: Secondary | ICD-10-CM | POA: Diagnosis not present

## 2017-11-18 DIAGNOSIS — D689 Coagulation defect, unspecified: Secondary | ICD-10-CM | POA: Diagnosis not present

## 2017-11-18 DIAGNOSIS — D509 Iron deficiency anemia, unspecified: Secondary | ICD-10-CM | POA: Diagnosis not present

## 2017-11-20 ENCOUNTER — Other Ambulatory Visit: Payer: Self-pay | Admitting: Internal Medicine

## 2017-11-20 DIAGNOSIS — D689 Coagulation defect, unspecified: Secondary | ICD-10-CM | POA: Diagnosis not present

## 2017-11-20 DIAGNOSIS — N2581 Secondary hyperparathyroidism of renal origin: Secondary | ICD-10-CM | POA: Diagnosis not present

## 2017-11-20 DIAGNOSIS — E1122 Type 2 diabetes mellitus with diabetic chronic kidney disease: Secondary | ICD-10-CM | POA: Diagnosis not present

## 2017-11-20 DIAGNOSIS — D509 Iron deficiency anemia, unspecified: Secondary | ICD-10-CM | POA: Diagnosis not present

## 2017-11-20 DIAGNOSIS — D631 Anemia in chronic kidney disease: Secondary | ICD-10-CM | POA: Diagnosis not present

## 2017-11-20 DIAGNOSIS — N186 End stage renal disease: Secondary | ICD-10-CM | POA: Diagnosis not present

## 2017-11-21 ENCOUNTER — Encounter (HOSPITAL_COMMUNITY): Payer: Self-pay

## 2017-11-21 ENCOUNTER — Emergency Department (HOSPITAL_COMMUNITY): Payer: Medicare Other

## 2017-11-21 ENCOUNTER — Inpatient Hospital Stay (HOSPITAL_COMMUNITY)
Admission: EM | Admit: 2017-11-21 | Discharge: 2017-12-02 | DRG: 871 | Disposition: A | Discharge status: Other Acute Inpatient Hospital | Payer: Medicare Other | Attending: Internal Medicine | Admitting: Internal Medicine

## 2017-11-21 ENCOUNTER — Other Ambulatory Visit: Payer: Self-pay

## 2017-11-21 DIAGNOSIS — Z4682 Encounter for fitting and adjustment of non-vascular catheter: Secondary | ICD-10-CM | POA: Diagnosis not present

## 2017-11-21 DIAGNOSIS — F1021 Alcohol dependence, in remission: Secondary | ICD-10-CM | POA: Diagnosis present

## 2017-11-21 DIAGNOSIS — D631 Anemia in chronic kidney disease: Secondary | ICD-10-CM | POA: Diagnosis present

## 2017-11-21 DIAGNOSIS — K219 Gastro-esophageal reflux disease without esophagitis: Secondary | ICD-10-CM | POA: Diagnosis present

## 2017-11-21 DIAGNOSIS — Z950 Presence of cardiac pacemaker: Secondary | ICD-10-CM | POA: Diagnosis not present

## 2017-11-21 DIAGNOSIS — R627 Adult failure to thrive: Secondary | ICD-10-CM | POA: Diagnosis present

## 2017-11-21 DIAGNOSIS — E1122 Type 2 diabetes mellitus with diabetic chronic kidney disease: Secondary | ICD-10-CM | POA: Diagnosis present

## 2017-11-21 DIAGNOSIS — N186 End stage renal disease: Secondary | ICD-10-CM | POA: Diagnosis present

## 2017-11-21 DIAGNOSIS — Z882 Allergy status to sulfonamides status: Secondary | ICD-10-CM

## 2017-11-21 DIAGNOSIS — I639 Cerebral infarction, unspecified: Secondary | ICD-10-CM | POA: Diagnosis not present

## 2017-11-21 DIAGNOSIS — R6520 Severe sepsis without septic shock: Secondary | ICD-10-CM | POA: Diagnosis not present

## 2017-11-21 DIAGNOSIS — R410 Disorientation, unspecified: Secondary | ICD-10-CM | POA: Diagnosis not present

## 2017-11-21 DIAGNOSIS — G92 Toxic encephalopathy: Secondary | ICD-10-CM | POA: Diagnosis not present

## 2017-11-21 DIAGNOSIS — A86 Unspecified viral encephalitis: Secondary | ICD-10-CM | POA: Diagnosis present

## 2017-11-21 DIAGNOSIS — Z4659 Encounter for fitting and adjustment of other gastrointestinal appliance and device: Secondary | ICD-10-CM | POA: Diagnosis not present

## 2017-11-21 DIAGNOSIS — Z881 Allergy status to other antibiotic agents status: Secondary | ICD-10-CM

## 2017-11-21 DIAGNOSIS — R400 Somnolence: Secondary | ICD-10-CM | POA: Diagnosis not present

## 2017-11-21 DIAGNOSIS — K8020 Calculus of gallbladder without cholecystitis without obstruction: Secondary | ICD-10-CM | POA: Diagnosis present

## 2017-11-21 DIAGNOSIS — N529 Male erectile dysfunction, unspecified: Secondary | ICD-10-CM | POA: Diagnosis present

## 2017-11-21 DIAGNOSIS — E873 Alkalosis: Secondary | ICD-10-CM | POA: Diagnosis present

## 2017-11-21 DIAGNOSIS — L8990 Pressure ulcer of unspecified site, unspecified stage: Secondary | ICD-10-CM

## 2017-11-21 DIAGNOSIS — K740 Hepatic fibrosis: Secondary | ICD-10-CM | POA: Diagnosis present

## 2017-11-21 DIAGNOSIS — Z6828 Body mass index (BMI) 28.0-28.9, adult: Secondary | ICD-10-CM

## 2017-11-21 DIAGNOSIS — I132 Hypertensive heart and chronic kidney disease with heart failure and with stage 5 chronic kidney disease, or end stage renal disease: Secondary | ICD-10-CM | POA: Diagnosis present

## 2017-11-21 DIAGNOSIS — R51 Headache: Secondary | ICD-10-CM | POA: Diagnosis not present

## 2017-11-21 DIAGNOSIS — E782 Mixed hyperlipidemia: Secondary | ICD-10-CM | POA: Diagnosis present

## 2017-11-21 DIAGNOSIS — N2581 Secondary hyperparathyroidism of renal origin: Secondary | ICD-10-CM | POA: Diagnosis not present

## 2017-11-21 DIAGNOSIS — R569 Unspecified convulsions: Secondary | ICD-10-CM | POA: Diagnosis not present

## 2017-11-21 DIAGNOSIS — Z886 Allergy status to analgesic agent status: Secondary | ICD-10-CM

## 2017-11-21 DIAGNOSIS — R012 Other cardiac sounds: Secondary | ICD-10-CM | POA: Diagnosis not present

## 2017-11-21 DIAGNOSIS — G9340 Encephalopathy, unspecified: Secondary | ICD-10-CM | POA: Diagnosis not present

## 2017-11-21 DIAGNOSIS — I6789 Other cerebrovascular disease: Secondary | ICD-10-CM | POA: Diagnosis not present

## 2017-11-21 DIAGNOSIS — R079 Chest pain, unspecified: Secondary | ICD-10-CM | POA: Diagnosis not present

## 2017-11-21 DIAGNOSIS — E113599 Type 2 diabetes mellitus with proliferative diabetic retinopathy without macular edema, unspecified eye: Secondary | ICD-10-CM | POA: Diagnosis present

## 2017-11-21 DIAGNOSIS — R471 Dysarthria and anarthria: Secondary | ICD-10-CM | POA: Diagnosis present

## 2017-11-21 DIAGNOSIS — L89151 Pressure ulcer of sacral region, stage 1: Secondary | ICD-10-CM | POA: Diagnosis present

## 2017-11-21 DIAGNOSIS — I442 Atrioventricular block, complete: Secondary | ICD-10-CM | POA: Diagnosis not present

## 2017-11-21 DIAGNOSIS — Z89511 Acquired absence of right leg below knee: Secondary | ICD-10-CM | POA: Diagnosis not present

## 2017-11-21 DIAGNOSIS — F0390 Unspecified dementia without behavioral disturbance: Secondary | ICD-10-CM | POA: Diagnosis present

## 2017-11-21 DIAGNOSIS — Z978 Presence of other specified devices: Secondary | ICD-10-CM | POA: Diagnosis not present

## 2017-11-21 DIAGNOSIS — I959 Hypotension, unspecified: Secondary | ICD-10-CM | POA: Diagnosis not present

## 2017-11-21 DIAGNOSIS — E8889 Other specified metabolic disorders: Secondary | ICD-10-CM | POA: Diagnosis present

## 2017-11-21 DIAGNOSIS — F319 Bipolar disorder, unspecified: Secondary | ICD-10-CM | POA: Diagnosis not present

## 2017-11-21 DIAGNOSIS — Z09 Encounter for follow-up examination after completed treatment for conditions other than malignant neoplasm: Secondary | ICD-10-CM | POA: Diagnosis not present

## 2017-11-21 DIAGNOSIS — R1310 Dysphagia, unspecified: Secondary | ICD-10-CM | POA: Diagnosis not present

## 2017-11-21 DIAGNOSIS — A419 Sepsis, unspecified organism: Principal | ICD-10-CM | POA: Diagnosis present

## 2017-11-21 DIAGNOSIS — Z89512 Acquired absence of left leg below knee: Secondary | ICD-10-CM

## 2017-11-21 DIAGNOSIS — I9589 Other hypotension: Secondary | ICD-10-CM | POA: Diagnosis present

## 2017-11-21 DIAGNOSIS — T45515A Adverse effect of anticoagulants, initial encounter: Secondary | ICD-10-CM | POA: Diagnosis present

## 2017-11-21 DIAGNOSIS — E669 Obesity, unspecified: Secondary | ICD-10-CM | POA: Diagnosis present

## 2017-11-21 DIAGNOSIS — Z79899 Other long term (current) drug therapy: Secondary | ICD-10-CM

## 2017-11-21 DIAGNOSIS — I447 Left bundle-branch block, unspecified: Secondary | ICD-10-CM | POA: Diagnosis present

## 2017-11-21 DIAGNOSIS — Z833 Family history of diabetes mellitus: Secondary | ICD-10-CM

## 2017-11-21 DIAGNOSIS — R6521 Severe sepsis with septic shock: Secondary | ICD-10-CM | POA: Diagnosis present

## 2017-11-21 DIAGNOSIS — G40109 Localization-related (focal) (partial) symptomatic epilepsy and epileptic syndromes with simple partial seizures, not intractable, without status epilepticus: Secondary | ICD-10-CM | POA: Diagnosis not present

## 2017-11-21 DIAGNOSIS — Z992 Dependence on renal dialysis: Secondary | ICD-10-CM

## 2017-11-21 DIAGNOSIS — R000 Tachycardia, unspecified: Secondary | ICD-10-CM | POA: Diagnosis not present

## 2017-11-21 DIAGNOSIS — R791 Abnormal coagulation profile: Secondary | ICD-10-CM | POA: Diagnosis present

## 2017-11-21 DIAGNOSIS — I5020 Unspecified systolic (congestive) heart failure: Secondary | ICD-10-CM | POA: Diagnosis not present

## 2017-11-21 DIAGNOSIS — I482 Chronic atrial fibrillation: Secondary | ICD-10-CM | POA: Diagnosis present

## 2017-11-21 DIAGNOSIS — R29810 Facial weakness: Secondary | ICD-10-CM | POA: Diagnosis present

## 2017-11-21 DIAGNOSIS — G253 Myoclonus: Secondary | ICD-10-CM | POA: Diagnosis not present

## 2017-11-21 DIAGNOSIS — Z87442 Personal history of urinary calculi: Secondary | ICD-10-CM

## 2017-11-21 DIAGNOSIS — J811 Chronic pulmonary edema: Secondary | ICD-10-CM | POA: Diagnosis not present

## 2017-11-21 DIAGNOSIS — N400 Enlarged prostate without lower urinary tract symptoms: Secondary | ICD-10-CM | POA: Diagnosis present

## 2017-11-21 DIAGNOSIS — G928 Other toxic encephalopathy: Secondary | ICD-10-CM | POA: Diagnosis present

## 2017-11-21 DIAGNOSIS — I4891 Unspecified atrial fibrillation: Secondary | ICD-10-CM | POA: Diagnosis not present

## 2017-11-21 DIAGNOSIS — J810 Acute pulmonary edema: Secondary | ICD-10-CM | POA: Diagnosis not present

## 2017-11-21 DIAGNOSIS — Z8673 Personal history of transient ischemic attack (TIA), and cerebral infarction without residual deficits: Secondary | ICD-10-CM

## 2017-11-21 DIAGNOSIS — Z880 Allergy status to penicillin: Secondary | ICD-10-CM | POA: Diagnosis not present

## 2017-11-21 DIAGNOSIS — E11649 Type 2 diabetes mellitus with hypoglycemia without coma: Secondary | ICD-10-CM | POA: Diagnosis not present

## 2017-11-21 DIAGNOSIS — Z79891 Long term (current) use of opiate analgesic: Secondary | ICD-10-CM

## 2017-11-21 DIAGNOSIS — I5042 Chronic combined systolic (congestive) and diastolic (congestive) heart failure: Secondary | ICD-10-CM | POA: Diagnosis present

## 2017-11-21 DIAGNOSIS — R509 Fever, unspecified: Secondary | ICD-10-CM | POA: Diagnosis not present

## 2017-11-21 DIAGNOSIS — I69992 Facial weakness following unspecified cerebrovascular disease: Secondary | ICD-10-CM | POA: Diagnosis not present

## 2017-11-21 DIAGNOSIS — E1151 Type 2 diabetes mellitus with diabetic peripheral angiopathy without gangrene: Secondary | ICD-10-CM | POA: Diagnosis present

## 2017-11-21 DIAGNOSIS — Z8711 Personal history of peptic ulcer disease: Secondary | ICD-10-CM

## 2017-11-21 DIAGNOSIS — E875 Hyperkalemia: Secondary | ICD-10-CM | POA: Diagnosis present

## 2017-11-21 DIAGNOSIS — B1920 Unspecified viral hepatitis C without hepatic coma: Secondary | ICD-10-CM | POA: Diagnosis not present

## 2017-11-21 DIAGNOSIS — R29708 NIHSS score 8: Secondary | ICD-10-CM | POA: Diagnosis present

## 2017-11-21 DIAGNOSIS — Z7901 Long term (current) use of anticoagulants: Secondary | ICD-10-CM

## 2017-11-21 DIAGNOSIS — Z8249 Family history of ischemic heart disease and other diseases of the circulatory system: Secondary | ICD-10-CM

## 2017-11-21 LAB — DIFFERENTIAL
BASOS ABS: 0 10*3/uL (ref 0.0–0.1)
Basophils Relative: 0 %
EOS ABS: 0 10*3/uL (ref 0.0–0.7)
Eosinophils Relative: 0 %
Lymphocytes Relative: 13 %
Lymphs Abs: 0.9 10*3/uL (ref 0.7–4.0)
Monocytes Absolute: 0.4 10*3/uL (ref 0.1–1.0)
Monocytes Relative: 6 %
NEUTROS ABS: 5.3 10*3/uL (ref 1.7–7.7)
NEUTROS PCT: 81 %

## 2017-11-21 LAB — I-STAT CHEM 8, ED
BUN: 51 mg/dL — AB (ref 6–20)
CHLORIDE: 101 mmol/L (ref 101–111)
Calcium, Ion: 1.03 mmol/L — ABNORMAL LOW (ref 1.15–1.40)
Creatinine, Ser: 9.6 mg/dL — ABNORMAL HIGH (ref 0.61–1.24)
Glucose, Bld: 183 mg/dL — ABNORMAL HIGH (ref 65–99)
HEMATOCRIT: 52 % (ref 39.0–52.0)
Hemoglobin: 17.7 g/dL — ABNORMAL HIGH (ref 13.0–17.0)
POTASSIUM: 5.6 mmol/L — AB (ref 3.5–5.1)
SODIUM: 133 mmol/L — AB (ref 135–145)
TCO2: 24 mmol/L (ref 22–32)

## 2017-11-21 LAB — COMPREHENSIVE METABOLIC PANEL
ALBUMIN: 3.8 g/dL (ref 3.5–5.0)
ALT: 16 U/L — ABNORMAL LOW (ref 17–63)
AST: 35 U/L (ref 15–41)
Alkaline Phosphatase: 56 U/L (ref 38–126)
Anion gap: 21 — ABNORMAL HIGH (ref 5–15)
BUN: 46 mg/dL — AB (ref 6–20)
CHLORIDE: 94 mmol/L — AB (ref 101–111)
CO2: 20 mmol/L — AB (ref 22–32)
Calcium: 10.6 mg/dL — ABNORMAL HIGH (ref 8.9–10.3)
Creatinine, Ser: 9.58 mg/dL — ABNORMAL HIGH (ref 0.61–1.24)
GFR calc Af Amer: 6 mL/min — ABNORMAL LOW (ref >60)
GFR calc non Af Amer: 5 mL/min — ABNORMAL LOW (ref >60)
Glucose, Bld: 189 mg/dL — ABNORMAL HIGH (ref 65–99)
POTASSIUM: 5.5 mmol/L — AB (ref 3.5–5.1)
SODIUM: 135 mmol/L (ref 135–145)
Total Bilirubin: 0.6 mg/dL (ref 0.3–1.2)
Total Protein: 8.7 g/dL — ABNORMAL HIGH (ref 6.5–8.1)

## 2017-11-21 LAB — CBC
HCT: 45.8 % (ref 39.0–52.0)
HEMOGLOBIN: 14.7 g/dL (ref 13.0–17.0)
MCH: 30 pg (ref 26.0–34.0)
MCHC: 32.1 g/dL (ref 30.0–36.0)
MCV: 93.5 fL (ref 78.0–100.0)
Platelets: 173 10*3/uL (ref 150–400)
RBC: 4.9 MIL/uL (ref 4.22–5.81)
RDW: 17.4 % — AB (ref 11.5–15.5)
WBC: 6.5 10*3/uL (ref 4.0–10.5)

## 2017-11-21 LAB — PROTIME-INR
INR: 2.51
Prothrombin Time: 26.9 seconds — ABNORMAL HIGH (ref 11.4–15.2)

## 2017-11-21 LAB — I-STAT CG4 LACTIC ACID, ED: Lactic Acid, Venous: 2.16 mmol/L (ref 0.5–1.9)

## 2017-11-21 LAB — APTT: APTT: 40 s — AB (ref 24–36)

## 2017-11-21 LAB — I-STAT TROPONIN, ED: TROPONIN I, POC: 0.04 ng/mL (ref 0.00–0.08)

## 2017-11-21 LAB — CBG MONITORING, ED: Glucose-Capillary: 184 mg/dL — ABNORMAL HIGH (ref 65–99)

## 2017-11-21 MED ORDER — SODIUM CHLORIDE 0.9 % IV SOLN
500.0000 mg | INTRAVENOUS | Status: DC
  Administered 2017-11-21 – 2017-11-26 (×5): 500 mg via INTRAVENOUS
  Filled 2017-11-21 (×6): qty 0.5

## 2017-11-21 MED ORDER — IOPAMIDOL (ISOVUE-370) INJECTION 76%
100.0000 mL | Freq: Once | INTRAVENOUS | Status: AC | PRN
  Administered 2017-11-21: 100 mL via INTRAVENOUS

## 2017-11-21 MED ORDER — HEPARIN SODIUM (PORCINE) 5000 UNIT/ML IJ SOLN: 5000.0000 [IU] | Freq: Three times a day (TID) | INTRAMUSCULAR | Status: DC

## 2017-11-21 MED ORDER — LEVETIRACETAM IN NACL 500 MG/100ML IV SOLN
500.0000 mg | Freq: Two times a day (BID) | INTRAVENOUS | Status: DC
  Administered 2017-11-21 – 2017-11-22 (×2): 500 mg via INTRAVENOUS
  Filled 2017-11-21 (×2): qty 100

## 2017-11-21 MED ORDER — SODIUM CHLORIDE 0.9% FLUSH
3.0000 mL | Freq: Two times a day (BID) | INTRAVENOUS | Status: DC
  Administered 2017-11-22 – 2017-12-02 (×16): 3 mL via INTRAVENOUS

## 2017-11-21 MED ORDER — SODIUM CHLORIDE 0.9 % IV BOLUS
1000.0000 mL | Freq: Once | INTRAVENOUS | Status: AC
  Administered 2017-11-21: 1000 mL via INTRAVENOUS

## 2017-11-21 MED ORDER — VANCOMYCIN HCL IN DEXTROSE 1-5 GM/200ML-% IV SOLN
1000.0000 mg | INTRAVENOUS | Status: AC
  Administered 2017-11-23 – 2017-11-25 (×2): 1000 mg via INTRAVENOUS
  Filled 2017-11-21 (×3): qty 200

## 2017-11-21 MED ORDER — AMIODARONE HCL 200 MG PO TABS
200.0000 mg | ORAL_TABLET | Freq: Every day | ORAL | Status: DC
  Administered 2017-11-23 – 2017-11-24 (×2): 200 mg via ORAL
  Filled 2017-11-21 (×4): qty 1

## 2017-11-21 MED ORDER — ACETAMINOPHEN 650 MG RE SUPP
650.0000 mg | Freq: Once | RECTAL | Status: AC
  Administered 2017-11-21: 650 mg via RECTAL
  Filled 2017-11-21: qty 1

## 2017-11-21 MED ORDER — VANCOMYCIN HCL 10 G IV SOLR
2000.0000 mg | Freq: Once | INTRAVENOUS | Status: AC
  Administered 2017-11-21: 2000 mg via INTRAVENOUS
  Filled 2017-11-21: qty 2000

## 2017-11-21 NOTE — ED Notes (Signed)
Per pt wife- pt is on medications to raise BP, states he has problems with hypotension following dialysis tx. States pt receives tx Tues, [employer], Sat- was only 30 min short of tx yesterday due to transportation issues.

## 2017-11-21 NOTE — Progress Notes (Signed)
Pharmacy Antibiotic Note  Oscar Castillo is a 67 y.o. male admitted on 11/21/2017 with sepsis.  Pharmacy has been consulted for Vancomycin dosing. Already on Merrem. WBC WNL. ESRD ON HD TTS.   Plan: Vancomycin 2000 mg IV x 1, then 1000 mg IV qHD TTS Already on Merrem F/U HD schedule Drug levels as indicated   Weight: 200 lb 9.9 oz (91 kg)  Temp (24hrs), Avg:103.9 F (39.9 C), Min:103 F (39.4 C), Max:104.7 F (40.4 C)  Recent Labs  Lab 11/21/17 1832 11/21/17 1850 11/21/17 2307  WBC  --  6.5  --   CREATININE 9.60* 9.58*  --   LATICACIDVEN  --   --  2.16*    Estimated Creatinine Clearance: 8.6 mL/min (A) (by C-G formula based on SCr of 9.58 mg/dL (H)).    Allergies  Allergen Reactions  . Ceftriaxone Sodium In Dextrose Anaphylaxis  . Ciprofloxacin Other (See Comments)    UNSPECIFIED PAIN  . Nsaids Other (See Comments)    HISTORY OF ULCER  . Penicillins Swelling    SWELLING REACTION UNSPECIFIED  [ PATIENT WITH HX OF ANAPHYLAXIS TO CEFTRIAXONE ]  . Sulfa Antibiotics Swelling    SWELLING REACTION UNSPECIFIED     Narda Bonds 11/21/2017 11:23 PM

## 2017-11-21 NOTE — ED Triage Notes (Signed)
Pt brought in by [facility] from home for sudden onset facial droop and AMS at 1745. Per EMS pt unable to follow commands, not answering questions appropriately. Per wife pt baseline is A+Ox4, hx of mild dementia. Pt is on dialysis, unsure of last tx. Pt has hx of afib- on coumadin. Pt had x1 episode emesis PTA.

## 2017-11-21 NOTE — Consult Note (Addendum)
Requesting Physician: Dr. Vanita Panda    Chief Complaint:   History obtained from: Chart    HPI:                                                                                                                                       Oscar Castillo is an 67 y.o. male past medical history of A. fib on Coumadin, bilateral below the knee amputation, end-stage renal disease on dialysis, chronic systolic and diastolic heart failure, CVA resonance with sudden onset altered mental status, right facial droop.  Who presented as a stroke alert. Last seen normal was 5:45 PM by his wife when his symptoms started. Apparently she patient became suddenly unable to speak, had right facial droop. No obvious seizure activity was noted. Patient had vomiting.   On arrival, mild right facial droop was noted. Patient was state his name but would not follow any other commands. Stat CT head did not show any hemorrhage. Did not receive TPA as his INR was greater than 1.7.   Date last known well: 4.28.19 Time last known well: 5.45 pm tPA Given: no, on anticoagulation, INR 2.3 NIHSS: 8 Baseline MRS 4    Past Medical History:  Diagnosis Date  . Anemia   . Antral ulcer 2014   small  . BENIGN PROSTATIC HYPERTROPHY   . Bipolar disorder ([place])    "sometimes" (10/07/2016)  . CHOLELITHIASIS   . Chronic combined systolic and diastolic CHF (congestive heart failure) ([place])   . Complication of anesthesia    wife states pt had trouble waking up with in Nov., 2014  . CVA (cerebral vascular accident) ([place]) 07/2007   No residual effect  . DEPRESSION   . DIABETES MELLITUS, TYPE II    diet control.  No medication  since November 2015  . ERECTILE DYSFUNCTION   . ESRD on hemodialysis ([place])    ESRD due to DM/HTN. .  HD TTS at [facility] on [place].  [name] GERD   . GI bleed    due to gastritis, discharged 10/02/16/notes 10/07/2016  . Hepatitis C    C - has been treated  . History of Clostridium difficile   . History of kidney  stones   . HYPERTENSION   . LBBB (left bundle branch block)   . Morbid obesity ([place])   . PAF (paroxysmal atrial fibrillation) ([place])    a. Dx 12/2015.    Past Surgical History:  Procedure Laterality Date  . AMPUTATION Left 05/12/2013   Procedure: AMPUTATION RAY;  Surgeon: Newt Minion, MD;  Location: [place];  Service: Orthopedics;  Laterality: Left;  Left Foot 1st Ray Amputation  . AMPUTATION Left 06/09/2013   Procedure: AMPUTATION BELOW KNEE;  Surgeon: Newt Minion, MD;  Location: [place];  Service: Orthopedics;  Laterality: Left;  Left Below Knee Amputation and removal proximal screws IM tibial nail  . AMPUTATION Right  09/08/2013   Procedure: AMPUTATION BELOW KNEE;  Surgeon: Newt Minion, MD;  Location: [place];  Service: Orthopedics;  Laterality: Right;  Right Below Knee Amputation  . AMPUTATION Right 10/11/2013   Procedure: AMPUTATION BELOW KNEE;  Surgeon: Newt Minion, MD;  Location: [place];  Service: Orthopedics;  Laterality: Right;  Right Below Knee Amputation Revision  . AV FISTULA PLACEMENT  06/14/2012   Procedure: ARTERIOVENOUS (AV) FISTULA CREATION;  Surgeon: Angelia Mould, MD;  Location: [facility] OR;  Service: Vascular;  Laterality: Left;  Left basilic vein transposition with fistula.  . AV FISTULA PLACEMENT Left 07/21/2017   Procedure: Attempt INSERTION OF ARTERIOVENOUS (AV) WITH  ARTEGRAFT Left  UPPER ARM;  Surgeon: Conrad Bristol, MD;  Location: [place];  Service: Vascular;  Laterality: Left;  . BASCILIC VEIN TRANSPOSITION Left 02/17/2017   Procedure: LEFT 1ST STAGE BRACHIAL VEIN TRANSPOSITION;  Surgeon: Conrad K[place]uk, MD;  Location: [place];  Service: Vascular;  Laterality: Left;  . BASCILIC VEIN TRANSPOSITION Left 04/21/2017   Procedure: LEFT 2ND STAGE BRACHIAL VEIN TRANSPOSITION;  Surgeon: Conrad Ro[place], MD;  Location: [place];  Service: Vascular;  Laterality: Left;  . COLONOSCOPY N/A 10/28/2012   Procedure: COLONOSCOPY;  Surgeon: Jeryl Columbia, MD;  Location[facility] END[facility]COPY;  Service:  Endoscopy;  Laterality: N/A;  . COLONOSCOPY N/A 11/02/2012   Procedure: COLONOSCOPY;  Surgeon: Cleotis Nipper, MD;  Location: [facility] ENDOSCOPY;  Service: Endoscopy;  Laterality: N/A;  . COLONOSCOPY N/A 11/03/2012   Procedure: COLONOSCOPY;  Surgeon: Cleotis Nipper, MD;  Location: [facility] ENDOSCOPY;  Service: Endoscopy;  Laterality: N/A;  . COLONOSCOPY N/A 09/16/2016   Procedure: COLONOSCOPY;  Surgeon: Jerene Bears, MD;  Location: [facility] ENDOSCOPY;  Service: Gastroenterology;  Laterality: N/A;  . ENTEROSCOPY N/A 11/08/2012   Procedure: ENTEROSCOPY;  Surgeon: Wonda Horner, MD;  Location: [facility] ENDOSCOPY;  Service: Endoscopy;  Laterality: N/A;  . ESOPHAGOGASTRODUODENOSCOPY N/A 11/02/2012   Procedure: ESOPHAGOGASTRODUODENOSCOPY (EGD);  Surgeon: Cleotis Nipper, MD;  Location: [facility] ENDOSCOPY;  Service: Endoscopy;  Laterality: N/A;  . ESOPHAGOGASTRODUODENOSCOPY (EGD) WITH PROPOFOL N/A 09/16/2016   Procedure: ESOPHAGOGASTRODUODENOSCOPY (EGD) WITH PROPOFOL;  Surgeon: Jerene Bears, MD;  Location: [facility] ENDOSCOPY;  Service: Gastroenterology;  Laterality: N/A;  . EXCHANGE OF A DIALYSIS CATHETER Left 11/13/2016   Procedure: EXCHANGE OF A DIALYSIS CATHETER;  Surgeon: Elam Dutch, MD;  Location: [place];  Service: Vascular;  Laterality: Left;  . EYE SURGERY Left    to remove scar tissue  . GIVENS CAPSULE STUDY N/A 11/04/2012   Procedure: GIVENS CAPSULE STUDY;  Surgeon: Cleotis Nipper, MD;  Location: [facility] ENDOSCOPY;  Service: Endoscopy;  Laterality: N/A;  . GIVENS CAPSULE STUDY N/A 09/29/2016   Procedure: GIVENS CAPSULE STUDY;  Surgeon: Manus Gunning, MD;  Location: [place];  Service: Gastroenterology;  Laterality: N/A;  try to keep pt up in bedside chair as much as possible during the study.    [name] HARDWARE REMOVAL Left 06/09/2013   Procedure: HARDWARE REMOVAL;  Surgeon: Newt Minion, MD;  Location: [place];  Service: Orthopedics;  Laterality: Left;  Left Below Knee Amputation  and Removal proximal screws IM tibial nail   . HEMATOMA EVACUATION Left 04/29/2017   Procedure: EVACUATION HEMATOMA LEFT ARM;  Surgeon: Conrad Sumatra, MD;  Location: [place];  Service: Vascular;  Laterality: Left;  . INSERTION OF DIALYSIS CATHETER N/A 11/09/2016   Procedure: INSERTION OF TEMPORARY DIALYSIS CATHETER;  Surgeon: Elam Dutch, MD;  Location: [place];  Service: Vascular;  Laterality: N/A;  . IR GENERIC HISTORICAL  09/27/2016   IR US GUIDE VASC ACCESS RIGHT 09/27/2016 Aletta Edouard, MD [facility]-INTER[facility]  . IR GENERIC HISTORICAL  09/27/2016   IR FLUORO GUIDE CV LINE RIGHT 09/27/2016 Aletta Edouard, MD [facility]  . LIGATION OF ARTERIOVENOUS  FISTULA Left 11/09/2016   Procedure: LIGATION OF ARTERIOVENOUS  FISTULA;  Surgeon: Elam Dutch, MD;  Location: [place];  Service: Vascular;  Laterality: Left;  . ORIF FIBULA FRACTURE Left 09/09/2012   Procedure: OPEN REDUCTION INTERNAL FIXATION (ORIF) FIBULA FRACTURE;  Surgeon: Johnny Bridge, MD;  Location: [place];  Service: Orthopedics;  Laterality: Left;  . PACEMAKER IMPLANT N/A 04/30/2017   Procedure: PACEMAKER IMPLANT;  Surgeon: Evans Lance, MD;  Location: [place] CV LAB;  Service: Cardiovascular;  Laterality: N/A;  . TEMPORARY PACEMAKER N/A 04/29/2017   Procedure: TEMPORARY PACEMAKER;  Surgeon: Lorretta Harp, MD;  Location: [place] CV LAB;  Service: Cardiovascular;  Laterality: N/A;  . TIBIA IM NAIL INSERTION Left 09/09/2012   Procedure: INTRAMEDULLARY (IM) NAIL TIBIAL;  Surgeon: Johnny Bridge, MD;  Location: [place];  Service: Orthopedics;  Laterality: Left;  left tibial nail and open reduction internal fixation left fibula fracture  . UPPER EXTREMITY VENOGRAPHY N/A 12/14/2016   Procedure: Bilateral Upper Extremity Venography and Central Venography;  Surgeon: Conrad [place], MD;  Location: [place] CV LAB;  Service: Cardiovascular;  Laterality: N/A;    Family History  Problem Relation Age of Onset  . Diabetes Mother   . Hypertension Mother   . Heart attack Father   .  Hypertension Father   . Coronary artery disease Other    Social History:  reports that he has never smoked. He has never used smokeless tobacco. He reports that he does not drink alcohol or use drugs.  Allergies:  Allergies  Allergen Reactions  . Ceftriaxone Sodium In Dextrose Anaphylaxis  . Ciprofloxacin Other (See Comments)    UNSPECIFIED PAIN  . Nsaids Other (See Comments)    HISTORY OF ULCER  . Penicillins Swelling    SWELLING REACTION UNSPECIFIED  [ PATIENT WITH HX OF ANAPHYLAXIS TO CEFTRIAXONE ]  . Sulfa Antibiotics Swelling    SWELLING REACTION UNSPECIFIED     Medications:                                                                                                                        I reviewed home medications   ROS:  Unable to obtain   Examination:                                                                                                      General: Appears well-developed and well-nourished.  Psych: Affect appropriate to situation Eyes: No scleral injection HENT: No OP obstrucion Head: Normocephalic.  Cardiovascular: Normal rate and regular rhythm.  Respiratory: Effort normal and breath sounds normal to anterior ascultation GI: Soft.  No distension. There is no tenderness.  Skin: WDI   Neurological Examination Mental Status: Alert, would repeat his name but not answer any other questions or follow commands Cranial Nerves: II: Visual fields:  Blinks to threat bilaterally III,IV, VI: ptosis not present, extra-ocular motions intact bilaterally, pupils equal, round, reactive to light and accommodation V,VII: face appears symmetric on both sides  VIII: unable to test  IX,X: Unable to test  XI: unable to test  XII: midline tongue extension Motor: Right : Upper extremity   4/5    Left:     Upper extremity   4/5  Lower  extremity  AKA    Lower extremity  AKA Tone and bulk: Bilateral asterixis noted Sensory: Withdrew to noxious stimulus of both sides Plantars: Unable to assess due to amputation Cerebellar: No gross ataxia  Gait: unable to assess   Lab Results: Basic Metabolic Panel: Recent Labs  Lab 11/21/17 1832  NA 133*  K 5.6*  CL 101  GLUCOSE 183*  BUN 51*  CREATININE 9.60*    CBC: Recent Labs  Lab 11/21/17 1832  HGB 17.7*  HCT 52.0    Coagulation Studies: No results for input(s): LABPROT, INR in the last 72 hours.  Imaging: Ct Head Code Stroke Wo Contrast  Result Date: 11/21/2017 CLINICAL DATA:  Code stroke. Right-sided facial droop. Not following commands. EXAM: CT HEAD WITHOUT CONTRAST TECHNIQUE: Contiguous axial images were obtained from the base of the skull through the vertex without intravenous contrast. COMPARISON:  07/07/2017 FINDINGS: Brain: No evidence of acute infarction, hemorrhage, hydrocephalus, extra-axial collection or mass lesion/mass effect. Atrophy, most notable at the brainstem. Extensive chronic small vessel ischemic change in the cerebral white matter. Vascular: Diffuse atherosclerotic calcification. No hyperdense vessel. Skull: No acute or aggressive finding Sinuses/Orbits: Bilateral cataract resection.  No acute finding Other: These results were communicated to Dr. Lorraine Lax at 6:35 pmon 4/28/2019by text page via the [facility] messaging system. ASPECTS [facility] Stroke Program Early CT Score) - Ganglionic level infarction (caudate, lentiform nuclei, internal capsule, insula, M1-M3 cortex): 7 - Supraganglionic infarction (M4-M6 cortex): 3 Total score (0-10 with 10 being normal): 10 IMPRESSION: 1. No acute finding. 2. Atrophy most prominent at the brainstem. Extensive chronic small vessel ischemia. Electronically Signed   By: Monte Fantasia M.D.   On: 11/21/2017 18:39     ASSESSMENT AND PLAN  67 y.o. male past medical history of A. fib on Coumadin, bilateral below the knee  amputation, end-stage renal disease on dialysis, chronic systolic and diastolic heart failure, CVA resonance with sudden onset altered mental status, right facial droop. Found to be febrile, hypotensive concerning for sepsis.  Impression:  Possible stroke versus seizure Sepsis  Admit to hospitalist team  Will start Keppra 500mg  BID, seizure precuations Obtain MRI Head to r/o stroke R EEG Treat infection/sepsis   Will continue to follow.  Sushanth Aroor [facility] Pager Number [phone]

## 2017-11-21 NOTE — ED Provider Notes (Addendum)
[facility] EMERGENCY DEPARTMENT Provider Note   CSN: [id number] Arrival date & time: 11/21/17  1821     History   Chief Complaint Chief Complaint  Patient presents with  . Code Stroke    HPI Oscar Castillo is a 67 y.o. male.  HPI Presents as a code stroke. Patient was last seen normal 1 hour prior to ED arrival, by his wife. She told EMS providers that he was in his usual state of health, awake, alert, oriented x3.  However, when she found him 1 hour ago he was minimally interactive, repetitive, with a right facial droop. EMS reports the patient was hypertensive in route, had perseverant speech, with decreased interactivity. The patient himself repeats his name and the word yes, cannot provide details of his history. Level 5 caveat secondary to mental status change. Past Medical History:  Diagnosis Date  . Anemia   . Antral ulcer 2014   small  . BENIGN PROSTATIC HYPERTROPHY   . Bipolar disorder ([place])    "sometimes" (10/07/2016)  . CHOLELITHIASIS   . Chronic combined systolic and diastolic CHF (congestive heart failure) ([place])   . Complication of anesthesia    wife states pt had trouble waking up with in Nov., 2014  . CVA (cerebral vascular accident) ([place]) 07/2007   No residual effect  . DEPRESSION   . DIABETES MELLITUS, TYPE II    diet control.  No medication  since November 2015  . ERECTILE DYSFUNCTION   . ESRD on hemodialysis ([place])    ESRD due to DM/HTN. .  HD TTS at [facility] [address].  [name] GERD   . GI bleed    due to gastritis, discharged 10/02/16/notes 10/07/2016  . Hepatitis C    C - has been treated  . History of Clostridium difficile   . History of kidney stones   . HYPERTENSION   . LBBB (left bundle branch block)   . Morbid obesity ([place])   . PAF (paroxysmal atrial fibrillation) ([place])    a. Dx 12/2015.    Patient Active Problem List   Diagnosis Date Noted  . Chronic pain 09/07/2017  . Non-pressure chronic ulcer of left calf  limited to breakdown of skin ([place]) 07/30/2017  . Dysphagia 07/10/2017  . Sacral ulcer ([place]) 05/07/2017  . CHB (complete heart block) ([place]) 04/29/2017  . Heart block AV third degree ([place]) 04/29/2017  . Complete heart block ([place])   . PAD (peripheral artery disease) ([place])   . General weakness 02/10/2017  . Gait disorder 02/10/2017  . Chronic neck pain 12/14/2016  . MSSA bacteremia   . Sepsis ([place]) 11/08/2016  . Encounter for therapeutic drug monitoring 10/07/2016  . Diabetes mellitus with complication ([place])   . Anemia due to stage 5 chronic kidney disease ([place])   . Hypotension   . Melena 09/26/2016  . Pressure injury of skin 09/26/2016  . Benign neoplasm of descending colon   . Gastroesophageal reflux disease with esophagitis   . Duodenal ulcer without hemorrhage or perforation   . Blood loss anemia 09/15/2016  . Paroxysmal atrial fibrillation ([place]) 09/15/2016  . Heme positive stool 09/15/2016  . RUQ pain 07/12/2016  . Hyperkalemia 07/01/2016  . Mixed hyperlipidemia 03/13/2016  . Anemia 12/31/2015  . Glaucoma 12/27/2015  . LBBB (left bundle branch block)   . Rash 11/15/2015  . Cramping of hands 11/15/2015  . ESRD on dialysis ([place]) 10/14/2015  . Liver fibrosis 10/07/2015  . Cigarette nicotine dependence without complication  06/06/2015  . Obesity (BMI 30-39.9) 06/06/2015  . Chronic bilateral low back pain without sciatica 06/06/2015  . Organ transplant candidate 05/17/2015  . DM (diabetes mellitus) with peripheral vascular complication ([place]) 16/04/9603  . Insulin-dependent diabetes mellitus with proliferative retinopathy ([place]) 05/17/2015  . Chronic bronchitis ([place]) 05/10/2015  . History of alcoholism ([place]) 05/10/2015  . History of peptic ulcer 05/10/2015  . Diarrhea 08/14/2014  . Dehydration 10/02/2013  . Fever 10/02/2013  . Altered mental status 10/01/2013  . Encephalopathy, toxic 10/01/2013  . Encephalopathy, metabolic 54/03/8118  . History of stroke 09/28/2013  . FTT  (failure to thrive) in adult 09/28/2013  . Acute confusional state 09/28/2013  . Ulcer of sacral region, stage 3 ([place]) 09/26/2013  . S/P BKA (below knee amputation) bilateral ([place]) 09/08/2013  . ESRD on hemodialysis ([place]) 05/09/2013  . Cerebrovascular accident ([place]) 02/20/2013  . Acute blood loss anemia 10/28/2012  . Acute on chronic combined systolic and diastolic CHF (congestive heart failure) ([place]) 10/28/2012  . Constipation 10/28/2012  . Chronic combined systolic and diastolic heart failure ([place]) 10/28/2012  . GI bleed 10/27/2012  . Mass in rectum 10/27/2012  . Type 2 diabetes mellitus with chronic kidney disease on chronic dialysis, with long-term current use of insulin ([place]) 09/08/2012  . Essential (primary) hypertension 09/08/2012  . LVH (left ventricular hypertrophy)-severe concentric 06/13/2012  . Anemia in other chronic diseases classified elsewhere 06/12/2012  . Hyperlipidemia, unspecified 01/30/2011  . CHOLELITHIASIS 08/01/2010  . BENIGN PROSTATIC HYPERTROPHY 08/01/2010  . History of cardiovascular disorder 08/06/2009  . Depression 03/18/2009  . NEPHROLITHIASIS, HX OF 03/18/2009  . Major depressive disorder, single episode, unspecified 03/18/2009  . Morbid obesity ([place]) 03/25/2007  . Hypertensive heart disease with CHF (congestive heart failure) ([place]) 03/25/2007  . GERD (gastroesophageal reflux disease) 03/25/2007  . Chronic hepatitis C without hepatic coma ([place]) 03/25/2007    Past Surgical History:  Procedure Laterality Date  . AMPUTATION Left 05/12/2013   Procedure: AMPUTATION RAY;  Surgeon: Newt Minion, MD;  Location: [facility];  Service: Orthopedics;  Laterality: Left;  Left Foot 1st Ray Amputation  . AMPUTATION Left 06/09/2013   Procedure: AMPUTATION BELOW KNEE;  Surgeon: Newt Minion, MD;  Location: [facility];  Service: Orthopedics;  Laterality: Left;  Left Below Knee Amputation and removal proximal screws IM tibial nail  . AMPUTATION Right 09/08/2013   Procedure:  AMPUTATION BELOW KNEE;  Surgeon: Newt Minion, MD;  Location: [facility];  Service: Orthopedics;  Laterality: Right;  Right Below Knee Amputation  . AMPUTATION Right 10/11/2013   Procedure: AMPUTATION BELOW KNEE;  Surgeon: Newt Minion, MD;  Location: [facility];  Service: Orthopedics;  Laterality: Right;  Right Below Knee Amputation Revision  . AV FISTULA PLACEMENT  06/14/2012   Procedure: ARTERIOVENOUS (AV) FISTULA CREATION;  Surgeon: Angelia Mould, MD;  Location[facility];  Service: Vascular;  Laterality: Left;  Left basilic vein transposition with fistula.  . AV FISTULA PLACEMENT Left 07/21/2017   Procedure: Attempt INSERTION OF ARTERIOVENOUS (AV) WITH  ARTEGRAFT Left  UPPER ARM;  Surgeon: Conrad Windsor, MD;  Location: [facility];  Service: Vascular;  Laterality: Left;  . BASCILIC VEIN TRANSPOSITION Left 02/17/2017   Procedure: LEFT 1ST STAGE BRACHIAL VEIN TRANSPOSITION;  Surgeon: Conrad Breathedsville, MD;  Location: [facility];  Service: Vascular;  Laterality: Left;  . BASCILIC VEIN TRANSPOSITION Left 04/21/2017   Procedure: LEFT 2ND STAGE BRACHIAL VEIN TRANSPOSITION;  Surgeon: Conrad Honaker, MD;  Location: [place];  Service: Vascular;  Laterality: Left;  . COLONOSCOPY N/A 10/28/2012   Procedure: COLONOSCOPY;  Surgeon: Jeryl Columbia, MD;  Location: [facility] ENDOSCOPY;  Service: Endoscopy;  Laterality: N/A;  . COLONOSCOPY N/A 11/02/2012   Procedure: COLONOSCOPY;  Surgeon: Cleotis Nipper, MD;  Location: [facility] ENDOSCOPY;  Service: Endoscopy;  Laterality: N/A;  . COLONOSCOPY N/A 11/03/2012   Procedure: COLONOSCOPY;  Surgeon: Cleotis Nipper, MD;  Location: [facility] ENDOSCOPY;  Service: Endoscopy;  Laterality: N/A;  . COLONOSCOPY N/A 09/16/2016   Procedure: COLONOSCOPY;  Surgeon: Jerene Bears, MD;  Location: [facility] ENDOSCOPY;  Service: Gastroenterology;  Laterality: N/A;  . ENTEROSCOPY N/A 11/08/2012   Procedure: ENTEROSCOPY;  Surgeon: Wonda Horner, MD;  Location: [facility] ENDOSCOPY;  Service: Endoscopy;  Laterality: N/A;  .  ESOPHAGOGASTRODUODENOSCOPY N/A 11/02/2012   Procedure: ESOPHAGOGASTRODUODENOSCOPY (EGD);  Surgeon: Cleotis Nipper, MD;  Location: [facility] ENDOSCOPY;  Service: Endoscopy;  Laterality: N/A;  . ESOPHAGOGASTRODUODENOSCOPY (EGD) WITH PROPOFOL N/A 09/16/2016   Procedure: ESOPHAGOGASTRODUODENOSCOPY (EGD) WITH PROPOFOL;  Surgeon: Jerene Bears, MD;  Location: [facility] ENDOSCOPY;  Service: Gastroenterology;  Laterality: N/A;  . EXCHANGE OF A DIALYSIS CATHETER Left 11/13/2016   Procedure: EXCHANGE OF A DIALYSIS CATHETER;  Surgeon: Elam Dutch, MD;  Location: [facility];  Service: Vascular;  Laterality: Left;  . EYE SURGERY Left    to remove scar tissue  . GIVENS CAPSULE STUDY N/A 11/04/2012   Procedure: GIVENS CAPSULE STUDY;  Surgeon: Cleotis Nipper, MD;  Location: [facility] ENDOSCOPY;  Service: Endoscopy;  Laterality: N/A;  . GIVENS CAPSULE STUDY N/A 09/29/2016   Procedure: GIVENS CAPSULE STUDY;  Surgeon: Manus Gunning, MD;  Location: [facility];  Service: Gastroenterology;  Laterality: N/A;  try to keep pt up in bedside chair as much as possible during the study.    [name] HARDWARE REMOVAL Left 06/09/2013   Procedure: HARDWARE REMOVAL;  Surgeon: Newt Minion, MD;  Location: [facility];  Service: Orthopedics;  Laterality: Left;  Left Below Knee Amputation  and Removal proximal screws IM tibial nail  . HEMATOMA EVACUATION Left 04/29/2017   Procedure: EVACUATION HEMATOMA LEFT ARM;  Surgeon: Conrad Verdigre, MD;  Location: [facility];  Service: Vascular;  Laterality: Left;  . INSERTION OF DIALYSIS CATHETER N/A 11/09/2016   Procedure: INSERTION OF TEMPORARY DIALYSIS CATHETER;  Surgeon: Elam Dutch, MD;  Location: [facility];  Service: Vascular;  Laterality: N/A;  . IR GENERIC HISTORICAL  09/27/2016   IR US GUIDE VASC ACCESS RIGHT 09/27/2016 Aletta Edouard, MD [facility]-INTER[facility] RAD  . IR GENERIC HISTORICAL  09/27/2016   IR FLUORO GUIDE CV LINE RIGHT 09/27/2016 Aletta Edouard, MD [facility]  . LIGATION OF ARTERIOVENOUS  FISTULA Left 11/09/2016    Procedure: LIGATION OF ARTERIOVENOUS  FISTULA;  Surgeon: Elam Dutch, MD;  Location: [place];  Service: Vascular;  Laterality: Left;  . ORIF FIBULA FRACTURE Left 09/09/2012   Procedure: OPEN REDUCTION INTERNAL FIXATION (ORIF) FIBULA FRACTURE;  Surgeon: Johnny Bridge, MD;  Location: [place];  Service: Orthopedics;  Laterality: Left;  . PACEMAKER IMPLANT N/A 04/30/2017   Procedure: PACEMAKER IMPLANT;  Surgeon: Evans Lance, MD;  Location: [place] [facility] CV LAB;  Service: Cardiovascular;  Laterality: N/A;  . TEMPORARY PACEMAKER N/A 04/29/2017   Procedure: TEMPORARY PACEMAKER;  Surgeon: Lorretta Harp, MD;  Location: [place] CV LAB;  Service: Cardiovascular;  Laterality: N/A;  . TIBIA IM NAIL INSERTION Left 09/09/2012   Procedure: INTRAMEDULLARY (IM) NAIL TIBIAL;  Surgeon: Johnny Bridge, MD;  Location: [place];  Service: Orthopedics;  Laterality: Left;  left tibial nail and open reduction internal fixation left fibula fracture  . UPPER EXTREMITY VENOGRAPHY N/A 12/14/2016   Procedure: Bilateral Upper Extremity Venography and Central Venography;  Surgeon: Conrad [place]. [place]s, MD;  Location: [place] CV LAB;  Service: Cardiovascular;  Laterality: N/A;        Home Medications    Prior to Admission medications   Medication Sig Start Date End Date Taking? Authorizing Provider  acetaminophen (TYLENOL) 500 MG tablet Take 500 mg by mouth every 6 (six) hours as needed for mild pain.    [provider]  amiodarone (PACERONE) 200 MG tablet Take 1 tablet (200 mg total) by mouth daily. 11/16/17   Croitoru, Mihai, MD  atorvastatin (LIPITOR) 40 MG tablet TAKE 1 TABLET(40 MG) BY MOUTH DAILY AT 6 PM 11/16/17   [name], MD  calcium acetate (PHOSLO) 667 MG capsule Take 1,334 mg by mouth 3 (three) times daily with meals.     [provider]  gabapentin (NEURONTIN) 300 MG capsule Take 1 capsule (300 mg total) by mouth 3 (three) times daily. 3 times a day when necessary neuropathy pain Patient  taking differently: Take 300 mg by mouth 3 (three) times daily as needed (nerve pain).  06/28/17   Newt Minion, MD  latanoprost (XALATAN) 0.005 % ophthalmic solution Place 1 drop into both eyes at bedtime.  06/12/16   [provider]  midodrine (PROAMATINE) 5 MG tablet TAKE 1 TABLET(5 MG) BY MOUTH TWICE DAILY WITH A MEAL 07/12/17   [name], MD  oxyCODONE-acetaminophen (PERCOCET/ROXICET) 5-325 MG tablet Take 1 tablet by mouth every 6 (six) hours as needed. 07/21/17   Ulyses Amor, PA-C  pantoprazole (PROTONIX) 40 MG tablet Take 1 tablet (40 mg total) by mouth 2 (two) times daily before a meal. 10/14/16   [name], MD  pentoxifylline (TRENTAL) 400 MG CR tablet Take 1 tablet (400 mg total) by mouth 3 (three) times daily with meals. 08/23/17   Suzan Slick, NP  QUEtiapine (SEROQUEL) 25 MG tablet Take 0.5 tablets (12.5 mg total) by mouth at bedtime. 05/09/17   Nita Sells, MD  senna (SENOKOT) 8.6 MG TABS tablet Take 1 tablet (8.6 mg total) by mouth daily. 05/09/17   Nita Sells, MD  SENSIPAR 30 MG tablet Take 30 mg by mouth every evening.  01/09/15   [provider]  silver sulfADIAZINE (SSD) 1 % cream APPLY EXTERNALLY TO THE AFFECTED AREA DAILY 07/30/17   Suzan Slick, NP  sorbitol 70 % SOLN Take 30 mLs by mouth 2 (two) times daily. Patient taking differently: Take 30 mLs by mouth 2 (two) times daily as needed for moderate constipation.  05/09/17   Nita Sells, MD  traMADol (ULTRAM) 50 MG tablet Take 50 mg by mouth every 6 (six) hours as needed for moderate pain.    [provider]  warfarin (COUMADIN) 2.5 MG tablet Take as directed by Anticoagulation Clinic Patient taking differently: Take 2.5 mg by mouth once daily at night 05/27/17   Fay Records, MD    Family History Family History  Problem Relation Age of Onset  . Diabetes Mother   . Hypertension Mother   . Heart attack Father   . Hypertension Father   . Coronary artery  disease Other     Social History Social History   Tobacco Use  . Smoking status: Never Smoker  . Smokeless tobacco: Never Used  Substance Use Topics  .  Alcohol use: No  . Drug use: No     Allergies   Ceftriaxone sodium in dextrose; Ciprofloxacin; Nsaids; Penicillins; and Sulfa antibiotics   Review of Systems Review of Systems  Unable to perform ROS: Mental status change     Physical Exam Updated Vital Signs BP (!) 143/114   Pulse (!) 112   Temp (!) 104.7 F (40.4 C) (Rectal)   Resp (!) 24   Wt 91 kg (200 lb 9.9 oz)   SpO2 95%   BMI 28.79 kg/m   Physical Exam  Constitutional: He appears well-developed. No distress.  Sickly adult male sitting upright left upper chest hemodialysis catheter appreciable. Patient has bilateral lower extremity irritation  HENT:  Head: Normocephalic and atraumatic.  Eyes: Conjunctivae and EOM are normal.  Cardiovascular: Normal rate and regular rhythm.  Pulmonary/Chest: Effort normal. No stridor. No respiratory distress.  Abdominal: He exhibits no distension.  Musculoskeletal: He exhibits deformity.  Bilateral lower extremity above-the-knee amputations  Neurological: He is alert.  Repetitive perseverant inconsistent speech.  Patient moves both arms spontaneously.  No obvious facial droop  Skin: Skin is warm and dry.  Psychiatric: Cognition and memory are impaired.  Confused  Nursing note and vitals reviewed.    ED Treatments / Results  Labs (all labs ordered are listed, but only abnormal results are displayed) Labs Reviewed  PROTIME-INR - Abnormal; Notable for the following components:      Result Value   Prothrombin Time 26.9 (*)    All other components within normal limits  APTT - Abnormal; Notable for the following components:   aPTT 40 (*)    All other components within normal limits  CBC - Abnormal; Notable for the following components:   RDW 17.4 (*)    All other components within normal limits  COMPREHENSIVE  METABOLIC PANEL - Abnormal; Notable for the following components:   Potassium 5.5 (*)    Chloride 94 (*)    CO2 20 (*)    Glucose, Bld 189 (*)    BUN 46 (*)    Creatinine, Ser 9.58 (*)    Calcium 10.6 (*)    Total Protein 8.7 (*)    ALT 16 (*)    GFR calc non Af Amer 5 (*)    GFR calc Af Amer 6 (*)    Anion gap 21 (*)    All other components within normal limits  CBG MONITORING, ED - Abnormal; Notable for the following components:   Glucose-Capillary 184 (*)    All other components within normal limits  I-STAT CHEM 8, ED - Abnormal; Notable for the following components:   Sodium 133 (*)    Potassium 5.6 (*)    BUN 51 (*)    Creatinine, Ser 9.60 (*)    Glucose, Bld 183 (*)    Calcium, Ion 1.03 (*)    Hemoglobin 17.7 (*)    All other components within normal limits  DIFFERENTIAL  I-STAT TROPONIN, ED    EKG EKG Interpretation  Date/Time:  Sunday November 21 2017 19:21:32 EDT Ventricular Rate:  112 PR Interval:    QRS Duration: 167 QT Interval:  456 QTC Calculation: 623 R Axis:   -16 Text Interpretation:  Sinus tachycardia Atrial premature complex Left bundle branch block Abnormal ekg Confirmed by Carmin Muskrat [phone]) on 11/21/2017 9:49:34 PM   Radiology Dg Chest Port 1 View  Result Date: 11/21/2017 CLINICAL DATA:  Fever and altered mentation. EXAM: PORTABLE CHEST 1 VIEW COMPARISON:  07/07/2017 FINDINGS: Dual lumen left  IJ dialysis catheter is stable with tip in the right atrium. Right-sided pacemaker apparatus with right atrial and right ventricular leads redemonstrated. Clear lungs. Normal size cardiac and mediastinal contours. No acute nor suspicious osseous abnormality. IMPRESSION: No active cardiopulmonary disease. Electronically Signed   By: Ashley Royalty M.D.   On: 11/21/2017 21:06   Ct Head Code Stroke Wo Contrast  Result Date: 11/21/2017 CLINICAL DATA:  Code stroke. Right-sided facial droop. Not following commands. EXAM: CT HEAD WITHOUT CONTRAST TECHNIQUE:  Contiguous axial images were obtained from the base of the skull through the vertex without intravenous contrast. COMPARISON:  07/07/2017 FINDINGS: Brain: No evidence of acute infarction, hemorrhage, hydrocephalus, extra-axial collection or mass lesion/mass effect. Atrophy, most notable at the brainstem. Extensive chronic small vessel ischemic change in the cerebral white matter. Vascular: Diffuse atherosclerotic calcification. No hyperdense vessel. Skull: No acute or aggressive finding Sinuses/Orbits: Bilateral cataract resection.  No acute finding Other: These results were communicated to Dr. Lorraine Lax at 6:35 pmon 4/28/2019by text page via the [facility] messaging system. ASPECTS [facility] Stroke Program Early CT Score) - Ganglionic level infarction (caudate, lentiform nuclei, internal capsule, insula, M1-M3 cortex): 7 - Supraganglionic infarction (M4-M6 cortex): 3 Total score (0-10 with 10 being normal): 10 IMPRESSION: 1. No acute finding. 2. Atrophy most prominent at the brainstem. Extensive chronic small vessel ischemia. Electronically Signed   By: Monte Fantasia M.D.   On: 11/21/2017 18:39    Procedures Procedures (including critical care time)  Medications Ordered in ED Medications  levETIRAcetam (KEPPRA) IVPB 500 mg/100 mL premix (has no administration in time range)  iopamidol (ISOVUE-370) 76 % injection 100 mL (100 mLs Intravenous Contrast Given 11/21/17 1837)  acetaminophen (TYLENOL) suppository 650 mg (650 mg Rectal Given 11/21/17 2015)     Initial Impression / Assessment and Plan / ED Course  I have reviewed the triage vital signs and the nursing notes.  Pertinent labs & imaging results that were available during my care of the patient were reviewed by me and considered in my medical decision making (see chart for details).    Update: Patient in a similar condition, continues to make little sense with his speech, is perseverant, but continued to protect his airway. Patient was found to be  febrile on exam, has received rectal Tylenol.  Neurology recommends initiation of Keppra, this will be done. Patient has no history of seizures, is not on antiepileptics.  Febrile seizure is a possibility, intercranial pathology less likely given reassuring CT scan results.  9:50 PM Temperature now 101, patient is no longer shaking. He is now accompanied by his wife.  When she notes the patient does have dementia, though typically can with conversation, though some of his speech is inconsistent, and unclear. She notes that he and has recently become fixated on weight loss, though he has no instructions from his physician to do so .  When she notes a similar recent changes in behavior and activity, attributed to dementia. However, today's change her baseline was notable.  When she was unaware the patient was febrile until he arrived here. She does note that he appears calm, his shaking has resolved, and he is in no distress. Patient CT scan unremarkable, labs generally reassuring, both concern for fever, altered mental status, there is some suspicion for line infection or other unclear source of fever, patient received empiric gentamicin, per pharmacy consult given allergies.  Patient is mildly hypotensive, though wife notes this is normal, and the patient takes Midrin for blood pressure support.  Concern for encephalopathy, fever, patient requires admission for further evaluation and management.   Final Clinical Impressions(s) / ED Diagnoses  Altered mental status Fever  CRITICAL CARE Performed by: Carmin Muskrat Total critical care time: 35 minutes Critical care time was exclusive of separately billable procedures and treating other patients. Critical care was necessary to treat or prevent imminent or life-threatening deterioration. Critical care was time spent personally by me on the following activities: development of treatment plan with patient and/or surrogate as well as nursing,  discussions with consultants, evaluation of patient's response to treatment, examination of patient, obtaining history from patient or surrogate, ordering and performing treatments and interventions, ordering and review of laboratory studies, ordering and review of radiographic studies, pulse oximetry and re-evaluation of patient's condition.   Carmin Muskrat, MD 11/21/17 2152    Carmin Muskrat, MD 11/21/17 2158

## 2017-11-21 NOTE — Progress Notes (Signed)
Pharmacy Antibiotic Note  Oscar Castillo is a 67 y.o. male admitted on 11/21/2017 with sepsis.  Pharmacy has been consulted for meropenem dosing.  Presented with facial droop and sudden onset altered mental status resulting in code stroke being called. Tmax 104.7, WBC WNL - concern for encephalopathy. Hx of ESRD - TTS. Has listed history of ceftriaxone and PCN - has been followed by ID in past and tolerated cefuroxime/cefazolin after allergy was listed. Changed antibiotic regimen to meropenem.   Plan: Meropenem 500 mg IV every 24 hours Monitor clinical pic, HD schedule, cx results, LOT  Weight: 200 lb 9.9 oz (91 kg)  Temp (24hrs), Avg:103.9 F (39.9 C), Min:103 F (39.4 C), Max:104.7 F (40.4 C)  Recent Labs  Lab 11/21/17 1832 11/21/17 1850  WBC  --  6.5  CREATININE 9.60* 9.58*    Estimated Creatinine Clearance: 8.6 mL/min (A) (by C-G formula based on SCr of 9.58 mg/dL (H)).    Allergies  Allergen Reactions  . Ceftriaxone Sodium In Dextrose Anaphylaxis  . Ciprofloxacin Other (See Comments)    UNSPECIFIED PAIN  . Nsaids Other (See Comments)    HISTORY OF ULCER  . Penicillins Swelling    SWELLING REACTION UNSPECIFIED  [ PATIENT WITH HX OF ANAPHYLAXIS TO CEFTRIAXONE ]  . Sulfa Antibiotics Swelling    SWELLING REACTION UNSPECIFIED     Antimicrobials this admission: Meropenem 4/28 >>   Dose adjustments this admission: N/A  Microbiology results: 4/28 BCx: sent  Thank you for allowing pharmacy to be a part of this patient's care.  Doylene Canard, PharmD Clinical Pharmacist  Pager: [phone] Phone: [phone] 11/21/2017 10:26 PM

## 2017-11-22 ENCOUNTER — Inpatient Hospital Stay (HOSPITAL_COMMUNITY): Payer: Medicare Other

## 2017-11-22 DIAGNOSIS — A419 Sepsis, unspecified organism: Secondary | ICD-10-CM | POA: Diagnosis present

## 2017-11-22 DIAGNOSIS — G9340 Encephalopathy, unspecified: Secondary | ICD-10-CM

## 2017-11-22 DIAGNOSIS — R6521 Severe sepsis with septic shock: Secondary | ICD-10-CM

## 2017-11-22 LAB — COMPREHENSIVE METABOLIC PANEL
ALBUMIN: 3 g/dL — AB (ref 3.5–5.0)
ALT: 13 U/L — ABNORMAL LOW (ref 17–63)
ANION GAP: 10 (ref 5–15)
AST: 36 U/L (ref 15–41)
Alkaline Phosphatase: 41 U/L (ref 38–126)
BILIRUBIN TOTAL: 0.7 mg/dL (ref 0.3–1.2)
BUN: 51 mg/dL — ABNORMAL HIGH (ref 6–20)
CHLORIDE: 105 mmol/L (ref 101–111)
CO2: 22 mmol/L (ref 22–32)
Calcium: 9.2 mg/dL (ref 8.9–10.3)
Creatinine, Ser: 10.4 mg/dL — ABNORMAL HIGH (ref 0.61–1.24)
GFR calc Af Amer: 5 mL/min — ABNORMAL LOW (ref >60)
GFR calc non Af Amer: 5 mL/min — ABNORMAL LOW (ref >60)
GLUCOSE: 136 mg/dL — AB (ref 65–99)
POTASSIUM: 4.6 mmol/L (ref 3.5–5.1)
Sodium: 137 mmol/L (ref 135–145)
TOTAL PROTEIN: 6.9 g/dL (ref 6.5–8.1)

## 2017-11-22 LAB — PROTIME-INR
INR: 2.8
PROTHROMBIN TIME: 29.3 s — AB (ref 11.4–15.2)

## 2017-11-22 LAB — RESPIRATORY PANEL BY PCR
Adenovirus: NOT DETECTED
BORDETELLA PERTUSSIS-RVPCR: NOT DETECTED
CORONAVIRUS OC43-RVPPCR: NOT DETECTED
Chlamydophila pneumoniae: NOT DETECTED
Coronavirus 229E: NOT DETECTED
Coronavirus HKU1: NOT DETECTED
Coronavirus NL63: NOT DETECTED
INFLUENZA A-RVPPCR: NOT DETECTED
INFLUENZA B-RVPPCR: NOT DETECTED
METAPNEUMOVIRUS-RVPPCR: NOT DETECTED
Mycoplasma pneumoniae: NOT DETECTED
PARAINFLUENZA VIRUS 1-RVPPCR: NOT DETECTED
PARAINFLUENZA VIRUS 4-RVPPCR: NOT DETECTED
Parainfluenza Virus 2: NOT DETECTED
Parainfluenza Virus 3: NOT DETECTED
RESPIRATORY SYNCYTIAL VIRUS-RVPPCR: NOT DETECTED
RHINOVIRUS / ENTEROVIRUS - RVPPCR: NOT DETECTED

## 2017-11-22 LAB — POCT I-STAT 3, ART BLOOD GAS (G3+)
Acid-base deficit: 5 mmol/L — ABNORMAL HIGH (ref 0.0–2.0)
Bicarbonate: 19.4 mmol/L — ABNORMAL LOW (ref 20.0–28.0)
O2 Saturation: 94 %
PH ART: 7.375 (ref 7.350–7.450)
TCO2: 20 mmol/L — AB (ref 22–32)
pCO2 arterial: 33.2 mmHg (ref 32.0–48.0)
pO2, Arterial: 72 mmHg — ABNORMAL LOW (ref 83.0–108.0)

## 2017-11-22 LAB — GLUCOSE, CAPILLARY
GLUCOSE-CAPILLARY: 116 mg/dL — AB (ref 65–99)
Glucose-Capillary: 115 mg/dL — ABNORMAL HIGH (ref 65–99)
Glucose-Capillary: 117 mg/dL — ABNORMAL HIGH (ref 65–99)
Glucose-Capillary: 102 mg/dL — ABNORMAL HIGH (ref 65–99)

## 2017-11-22 LAB — LACTIC ACID, PLASMA
LACTIC ACID, VENOUS: 1.5 mmol/L (ref 0.5–1.9)
Lactic Acid, Venous: 1 mmol/L (ref 0.5–1.9)
Lactic Acid, Venous: 1 mmol/L (ref 0.5–1.9)

## 2017-11-22 LAB — I-STAT CG4 LACTIC ACID, ED: Lactic Acid, Venous: 5.64 mmol/L (ref 0.5–1.9)

## 2017-11-22 LAB — CBC
HEMATOCRIT: 38.2 % — AB (ref 39.0–52.0)
HEMOGLOBIN: 12.2 g/dL — AB (ref 13.0–17.0)
MCH: 29.9 pg (ref 26.0–34.0)
MCHC: 31.9 g/dL (ref 30.0–36.0)
MCV: 93.6 fL (ref 78.0–100.0)
Platelets: 167 10*3/uL (ref 150–400)
RBC: 4.08 MIL/uL — ABNORMAL LOW (ref 4.22–5.81)
RDW: 17.7 % — ABNORMAL HIGH (ref 11.5–15.5)
WBC: 11.2 10*3/uL — ABNORMAL HIGH (ref 4.0–10.5)

## 2017-11-22 LAB — I-STAT ARTERIAL BLOOD GAS, ED
ACID-BASE DEFICIT: 4 mmol/L — AB (ref 0.0–2.0)
BICARBONATE: 17.7 mmol/L — AB (ref 20.0–28.0)
O2 SAT: 85 %
PO2 ART: 45 mmHg — AB (ref 83.0–108.0)
Patient temperature: 98.6
TCO2: 18 mmol/L — AB (ref 22–32)
pCO2 arterial: 24.8 mmHg — ABNORMAL LOW (ref 32.0–48.0)
pH, Arterial: 7.462 — ABNORMAL HIGH (ref 7.350–7.450)

## 2017-11-22 LAB — MAGNESIUM: MAGNESIUM: 1.9 mg/dL (ref 1.7–2.4)

## 2017-11-22 LAB — MRSA PCR SCREENING: MRSA BY PCR: NEGATIVE

## 2017-11-22 LAB — CORTISOL: Cortisol, Plasma: 16.6 ug/dL

## 2017-11-22 LAB — AMMONIA: Ammonia: 29 umol/L (ref 9–35)

## 2017-11-22 LAB — PHOSPHORUS: Phosphorus: 2.9 mg/dL (ref 2.5–4.6)

## 2017-11-22 LAB — CBG MONITORING, ED: Glucose-Capillary: 163 mg/dL — ABNORMAL HIGH (ref 65–99)

## 2017-11-22 LAB — PROCALCITONIN: Procalcitonin: 40.31 ng/mL

## 2017-11-22 MED ORDER — HEPARIN SODIUM (PORCINE) 1000 UNIT/ML DIALYSIS
1000.0000 [IU] | INTRAMUSCULAR | Status: DC | PRN
  Administered 2017-11-22: 1900 [IU]
  Filled 2017-11-22: qty 6
  Filled 2017-11-22: qty 2
  Filled 2017-11-22: qty 6

## 2017-11-22 MED ORDER — SODIUM CHLORIDE 0.9 % IV SOLN: 250.0000 mL | INTRAVENOUS | Status: DC | PRN

## 2017-11-22 MED ORDER — HEPARIN SOD (PORK) LOCK FLUSH 10 UNIT/ML IV SOLN
10.0000 [IU] | Freq: Once | INTRAVENOUS | Status: DC
  Filled 2017-11-22: qty 1

## 2017-11-22 MED ORDER — WARFARIN SODIUM 2.5 MG PO TABS: 2.5000 mg | ORAL_TABLET | ORAL | Status: DC

## 2017-11-22 MED ORDER — DEXTROSE 5 % IV SOLN
460.0000 mg | INTRAVENOUS | Status: DC
  Filled 2017-11-22: qty 9.2

## 2017-11-22 MED ORDER — SODIUM CHLORIDE 0.9 % IV BOLUS
1000.0000 mL | Freq: Once | INTRAVENOUS | Status: AC
  Administered 2017-11-22: 1000 mL via INTRAVENOUS

## 2017-11-22 MED ORDER — DEXTROSE 5 % IV SOLN
460.0000 mg | Freq: Once | INTRAVENOUS | Status: AC
  Administered 2017-11-22: 460 mg via INTRAVENOUS
  Filled 2017-11-22: qty 9.2

## 2017-11-22 MED ORDER — WARFARIN - PHARMACIST DOSING INPATIENT: Freq: Every day | Status: DC

## 2017-11-22 MED ORDER — SODIUM CHLORIDE 0.9 % IV SOLN
20.0000 ug/min | INTRAVENOUS | Status: DC
  Administered 2017-11-22: 25 ug/min via INTRAVENOUS
  Administered 2017-11-22: 20 ug/min via INTRAVENOUS
  Filled 2017-11-22 (×2): qty 10
  Filled 2017-11-22: qty 1

## 2017-11-22 MED ORDER — ACETAMINOPHEN 650 MG RE SUPP
650.0000 mg | Freq: Four times a day (QID) | RECTAL | Status: DC | PRN
  Administered 2017-11-22 – 2017-11-27 (×2): 650 mg via RECTAL
  Filled 2017-11-22 (×2): qty 1

## 2017-11-22 MED ORDER — HEPARIN SODIUM (PORCINE) 1000 UNIT/ML DIALYSIS
1000.0000 [IU] | INTRAMUSCULAR | Status: DC | PRN
  Filled 2017-11-22: qty 6

## 2017-11-22 MED ORDER — ACETAMINOPHEN 325 MG PO TABS
650.0000 mg | ORAL_TABLET | Freq: Four times a day (QID) | ORAL | Status: DC | PRN
Start: 2017-11-22 — End: 2017-12-02

## 2017-11-22 MED ORDER — ORAL CARE MOUTH RINSE
15.0000 mL | Freq: Two times a day (BID) | OROMUCOSAL | Status: DC
  Administered 2017-11-22 – 2017-12-02 (×19): 15 mL via OROMUCOSAL

## 2017-11-22 MED ORDER — WARFARIN 1.25 MG HALF TABLET: 1.2500 mg | ORAL_TABLET | ORAL | Status: DC

## 2017-11-22 MED ORDER — ALBUTEROL SULFATE (2.5 MG/3ML) 0.083% IN NEBU: 2.5000 mg | INHALATION_SOLUTION | RESPIRATORY_TRACT | Status: DC | PRN

## 2017-11-22 MED ORDER — INSULIN ASPART 100 UNIT/ML ~~LOC~~ SOLN
2.0000 [IU] | SUBCUTANEOUS | Status: DC
  Administered 2017-11-22: 4 [IU] via SUBCUTANEOUS
  Filled 2017-11-22: qty 1

## 2017-11-22 MED ORDER — DEXTROSE 5 % IV SOLN
460.0000 mg | INTRAVENOUS | Status: DC
  Administered 2017-11-23 – 2017-11-25 (×3): 460 mg via INTRAVENOUS
  Filled 2017-11-22 (×3): qty 9.2

## 2017-11-22 MED ORDER — FAMOTIDINE IN NACL 20-0.9 MG/50ML-% IV SOLN
20.0000 mg | Freq: Every day | INTRAVENOUS | Status: DC
  Administered 2017-11-22 – 2017-12-02 (×10): 20 mg via INTRAVENOUS
  Filled 2017-11-22 (×10): qty 50

## 2017-11-22 MED ORDER — ONDANSETRON HCL 4 MG/2ML IJ SOLN: 4.0000 mg | Freq: Four times a day (QID) | INTRAMUSCULAR | Status: DC | PRN

## 2017-11-22 NOTE — Progress Notes (Signed)
Subjective: Continues to be densely encephaloathic. Unclear source for sepsis.   Exam: Vitals:   11/22/17 0700 11/22/17 0744  BP: (!) 84/60   Pulse: 74   Resp: 19   Temp:  99.9 F (37.7 C)  SpO2: 97%    Gen: In bed, NAD Resp: non-labored breathing, no acute distress Abd: soft, nt Neck: he resists all passive movement, not just neck so unclear if stiff.   Neuro: MS: Opens eyes to noxious stim, follows command to squeeze hands, tells me his name but answers no other questions. when asked to stick out tongue, he opens mouth but doe snot stick it out. When asked to lift his legs, he repeats the phrase, but does not obey.  ZY:YQMG are dysconjugate, pupils are slightly asymmetric right larger than left, both are reactive.  Motor: he moves bilateral upper extremities with good strength, flicker of movement to nox stim in lowers bilaterally Sensory: he responds to nox stim in all extremitie.  Pertinent Labs: CMP - Cr 10.4 Na 137 CO2 22  Impression: 67 yo M with dense encephalopathy in the setting of high fevers. Without definite source, I would favor empiric CNS coverage pending  Recommendations: 1) LP, will need INR reversed prior 2) empiric CNS coverage pending LP 3) EEG 4) MRI brain 5) will continue to follow.    Roland Rack, MD [facility] [phone]  If 7pm- 7am, please page neurology on call as listed in [place].

## 2017-11-22 NOTE — Progress Notes (Signed)
EEG completed, results pending. 

## 2017-11-22 NOTE — Procedures (Signed)
ELECTROENCEPHALOGRAM REPORT  Date of Study: 11/22/2017  Patient's Name: Oscar Castillo MRN: [id number] Date of Birth: [birth year]  Referring Provider: Vernie Murders, MD  Clinical History:  67 y.o m with ESRD on HD, Essential HTN, HCV, Afib on coumadin, Diabetes mellitus, Bipolar disorder, hx of renal stones, HFrEF takes midodrine at home, prior CVA who presented with acute encephalopathy, dysarthria, right facial droop, fatigue, and in septic shock.  Medications: Keppra Meropenem Phenylephrine  Technical Summary: A multichannel digital EEG recording measured by the international 10-20 system with electrodes applied with paste and impedances below 5000 ohms performed as portable with EKG monitoring in an awake and drowsy patient.  Hyperventilation and photic stimulation were not performed.  The digital EEG was referentially recorded, reformatted, and digitally filtered in a variety of bipolar and referential montages for optimal display.   Description: The patient is awake and drowsy during the recording.  During maximal wakefulness, there is a symmetric, medium voltage 5-6 Hz posterior dominant rhythm that attenuates with eye opening. This is admixed with diffuse 4-5 Hz theta and 2-3 Hz delta slowing of the waking background.  During drowsiness, there is an increase in theta slowing of the background.  Stage 2 sleep is not seen.  There were no epileptiform discharges or electrographic seizures seen.    EKG lead was unremarkable.  Impression: This awake and drowsy EEG is abnormal due to diffuse slowing of the waking background.  Clinical Correlation of the above findings indicates diffuse cerebral dysfunction that is non-specific in etiology and can be seen with hypoxic/ischemic injury, toxic/metabolic encephalopathies, neurodegenerative disorders, or medication effect.  The absence of epileptiform discharges does not rule out a clinical diagnosis of epilepsy.  Clinical correlation is  advised.   Metta Clines, DO

## 2017-11-22 NOTE — H&P (Signed)
History and Physical   Oscar Castillo VOZ:[id number] DOB: [birth year] DOA: 11/21/2017  PCP: [name], MD  Chief Complaint: confusion  HPI: this 67 year old man with medical problems including end-stage renal disease, formerly poorly controlled diabetes, history of AV block heart pacer, atrial fibrillation on vitamin K antagonists, bilateral BKA, who presents with confusion.  History is obtained via EMR review as well as discussion with the wife is present at the bedside. Patient cannot report history due to altered mental status. Wife reports he has early dementia but over the past 2 days he's had worsening of his mental status. Day prior to admission he did not know what to do with the basin and towel with which he washes daily, on the day of admission the wife reports continued confusion prompting him to seek medical care. At baseline, he is alert and oriented but does require ADL assistance.  Last dialysis session was Saturday prior to admission, only performed for about 30 minutes to transportation issues. Wife reports that he was trembling and shaking uncontrollably like he was cold as well as decreased appetite over the past few days.Denies any new skin lesions, does have a small skin lesion healing over the left stump. Takes midodrine at home, wife reports baseline systolic blood pressure in the 120s.  ED Course: in the emergency department vital signs remarkable for fever to 104.7, tachycardic to 110, increase respiratory rate 27, systolic blood pressures which range from 143 to a nadir of 75 millimeters mercury. Due to some concern over stroke, code stroke was called due to report of tremulousness and possible facial droop.  Started on Keppra. Hospital medicine consult further management.   Review of Systems: A complete ROS was unable to be obtained due to the patient's altered mental status.  Past Medical History:  Diagnosis Date  . Anemia   . Antral ulcer 2014   small  . BENIGN  PROSTATIC HYPERTROPHY   . Bipolar disorder ([place])    "sometimes" (10/07/2016)  . CHOLELITHIASIS   . Chronic combined systolic and diastolic CHF (congestive heart failure) ([place])   . Complication of anesthesia    wife states pt had trouble waking up with in Nov., 2014  . CVA (cerebral vascular accident) ([place]) 07/2007   No residual effect  . DEPRESSION   . DIABETES MELLITUS, TYPE II    diet control.  No medication  since November 2015  . ERECTILE DYSFUNCTION   . ESRD on hemodialysis ([place])    ESRD due to DM/HTN. .  HD TTS at [facility] on [address].  [name] GERD   . GI bleed    due to gastritis, discharged 10/02/16/notes 10/07/2016  . Hepatitis C    C - has been treated  . History of Clostridium difficile   . History of kidney stones   . HYPERTENSION   . LBBB (left bundle branch block)   . Morbid obesity ([place])   . PAF (paroxysmal atrial fibrillation) ([place])    a. Dx 12/2015.   Social History   Socioeconomic History  . Marital status: Married    Spouse name: Not on file  . Number of children: 2  . Years of education: 49  . Highest education level: Not on file  Occupational History  . Occupation: disabled due to stroke    Employer: RETIRED  Social Needs  . Financial resource strain: Not on file  . Food insecurity:    Worry: Not on file    Inability: Not on file  .  Transportation needs:    Medical: Not on file    Non-medical: Not on file  Tobacco Use  . Smoking status: Never Smoker  . Smokeless tobacco: Never Used  Substance and Sexual Activity  . Alcohol use: No  . Drug use: No  . Sexual activity: Yes    Birth control/protection: None  Lifestyle  . Physical activity:    Days per week: Not on file    Minutes per session: Not on file  . Stress: Not on file  Relationships  . Social connections:    Talks on phone: Not on file    Gets together: Not on file    Attends religious service: Not on file    Active member of club or organization: Not on file    Attends meetings  of clubs or organizations: Not on file    Relationship status: Not on file  . Intimate partner violence:    Fear of current or ex partner: Not on file    Emotionally abused: Not on file    Physically abused: Not on file    Forced sexual activity: Not on file  Other Topics Concern  . 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 Diabetes Mother   . Hypertension Mother   . Heart attack Father   . Hypertension Father   . Coronary artery disease Other     Physical Exam: Vitals:   11/21/17 2100 11/21/17 2115 11/21/17 2130 11/21/17 2200  BP: (!) 80/58 (!) 79/64 (!) 86/61 (!) 81/51  Pulse:      Resp: (!) 24 15 (!) 21 (!) 25  Temp:      TempSrc:      SpO2:      Weight:       General: Obese black man, somnolent, but awakens to verbal stimuli.  Does not answer questions. ENT: Grossly normal hearing, dry mucus membranes. Cardiovascular: RRR. No M/R/G.  Heart sounds distant.  No LE edema bilateral BKA stumps. Respiratory: CTA anterior lung fields.  Breathing room air. Abdomen: Soft, non-tender.  Skin: No rash or induration seen on limited exam other than small healing wound on left BKA stump. Musculoskeletal: b/l BKAs. Psychiatric: flat affect 2/2 to enceophalopathy Neurologic: pupils b/l round and reactive to light HD access: left chest perm cath  I have personally reviewed the following labs, culture data, and imaging studies.  Assessment/Plan:  #Severe sepsis, unknown cause On admission, fever, tachycardic, increased RR, lactic acid first just over 2, has risen to 5.6.  CXR without focal PNA.  Perm cath presence raises possibility of HD catheter associated infection. Plan: Will continue broad spectrum antimicrobials with vancomycin + meropenem.  Follow up blood cx.  Discussed case with on call critical care attending, will continue to provide volume  resuscitation to total 3 L of isotonic fluids and re-evaluate.  Critical care team to evaluate at bedside as well.  #Other problems -Acute encephalopathy: anticipate related to infection, continue to monitor -Concern for seizures: s/p load with AED, consider EEG vs not pursuing in the setting of infection being more likely -AF, hx of: hold VKA for now, continue amiodarone, rate controlled -ESRD: HD schedule of TTS; however, most recent HD session not fully completed, plan to consult nephrology team in AM -Depression / chart reported bipolar d/o: home med - Seroquel, hold for now  DVT prophylaxis: INR therapeutic between 2-3 Code Status: full code Disposition Plan: TBD Consults called: neurology consulted by emergency medicine team, critical care consulted by me Admission status: admit to step down unit unless clinical worsening necessitates higher level of care to ICU.  Cheri Rous, MD [facility] [phone]  If 7PM-7AM, please contact night-coverage [email or web address] Password [email or web address]

## 2017-11-22 NOTE — Progress Notes (Addendum)
PULMONARY / CRITICAL CARE MEDICINE   Name: Oscar Castillo MRN: [id number] DOB: 11/18/1950    ADMISSION DATE:  11/21/2017 CONSULTATION DATE:  11/22/17  REFERRING MD: Dr. Stana Bunting  CHIEF COMPLAINT: Right facial droop   Brief patient summary: Mr. Oscar Castillo is a 67 y.o m with ESRD on HD, Essential HTN, HCV, Afib on coumadin, Diabetes mellitus, Bipolar disorder, hx of renal stones, HFrEF takes midodrine at home, prior CVA who presented with acute encephalopathy, dysarthria, right facial droop, fatigue, and in septic shock. Patient had received only 3hrs af his last dialysis session rather than the usual 4hrs he usually gets.   Presented febrile (104), tachycardic (110s), tachypnic (20s), hypotensive. Labs remarkable for a lactic acid of 2.16, negative troponin, elevated potassium at 5.6, creatinine at 9 (baseline 5-6), pt=26.9, aptt=40, normal liver enzymes and bilirubin. CT head did not show any acute abnormalities, atrophy in the brainstem, and extensive chronic small vessel disease. Chest xray shows no acute cardiopulmonary disease.   Patient thought to be in septic shock got 3L of IVF in ED, blood and sputum cultures collected, dialysis catheter was removed due to possible CLABSI, and MRI and EEG ordered. Patient was started on meropenem and vancomycin.   PAST MEDICAL HISTORY :  He  has a past medical history of Anemia, Antral ulcer (2014), BENIGN PROSTATIC HYPERTROPHY, Bipolar disorder ([place]), CHOLELITHIASIS, Chronic combined systolic and diastolic CHF (congestive heart failure) ([place]), Complication of anesthesia, CVA (cerebral vascular accident) ([place]) (07/2007), DEPRESSION, DIABETES MELLITUS, TYPE II, ERECTILE DYSFUNCTION, ESRD on hemodialysis ([place]), GERD, GI bleed, Hepatitis C, History of Clostridium difficile, History of kidney stones, HYPERTENSION, LBBB (left bundle branch block), Morbid obesity ([place]), and PAF (paroxysmal atrial fibrillation) ([place]).  PAST SURGICAL HISTORY: He  has a past surgical  history that includes AV fistula placement (06/14/2012); Tibia IM nail insertion (Left, 09/09/2012); ORIF fibula fracture (Left, 09/09/2012); Colonoscopy (N/A, 10/28/2012); Esophagogastroduodenoscopy (N/A, 11/02/2012); Colonoscopy (N/A, 11/02/2012); Colonoscopy (N/A, 11/03/2012); Givens capsule study (N/A, 11/04/2012); enteroscopy (N/A, 11/08/2012); Amputation (Left, 05/12/2013); Eye surgery (Left); Amputation (Left, 06/09/2013); Hardware Removal (Left, 06/09/2013); Amputation (Right, 09/08/2013); Amputation (Right, 10/11/2013); Colonoscopy (N/A, 09/16/2016); Esophagogastroduodenoscopy (egd) with propofol (N/A, 09/16/2016); ir generic historical (09/27/2016); ir generic historical (09/27/2016); Givens capsule study (N/A, 09/29/2016); Ligation of arteriovenous  fistula (Left, 11/09/2016); Insertion of dialysis catheter (N/A, 11/09/2016); Exchange of a dialysis catheter (Left, 11/13/2016); UPPER EXTREMITY VENOGRAPHY (N/A, 12/14/2016); Bascilic vein transposition (Left, 02/17/2017); Bascilic vein transposition (Left, 04/21/2017); TEMPORARY PACEMAKER (N/A, 04/29/2017); PACEMAKER IMPLANT (N/A, 04/30/2017); AV fistula placement (Left, 07/21/2017); and Hematoma evacuation (Left, 04/29/2017).  Allergies  Allergen Reactions  . Ceftriaxone Sodium In Dextrose Anaphylaxis  . Ciprofloxacin Other (See Comments)    UNSPECIFIED PAIN  . Nsaids Other (See Comments)    HISTORY OF ULCER  . Penicillins Swelling    SWELLING REACTION UNSPECIFIED  [ PATIENT WITH HX OF ANAPHYLAXIS TO CEFTRIAXONE ]  . Sulfa Antibiotics Swelling    SWELLING REACTION UNSPECIFIED     No current facility-administered medications on file prior to encounter.    Current Outpatient Medications on File Prior to Encounter  Medication Sig  . acetaminophen (TYLENOL) 500 MG tablet Take 500 mg by mouth every 6 (six) hours as needed for mild pain.  [name] amiodarone (PACERONE) 200 MG tablet Take 1 tablet (200 mg total) by mouth daily.  [name] atorvastatin (LIPITOR) 40 MG tablet TAKE 1  TABLET(40 MG) BY MOUTH DAILY AT 6 PM  . calcium acetate (PHOSLO) 667 MG capsule Take 1,334 mg by mouth 2 (two) times daily.   [name]  gabapentin (NEURONTIN) 300 MG capsule Take 1 capsule (300 mg total) by mouth 3 (three) times daily. 3 times a day when necessary neuropathy pain (Patient taking differently: Take 300 mg by mouth 2 (two) times daily. )  . latanoprost (XALATAN) 0.005 % ophthalmic solution Place 1 drop into both eyes at bedtime.   . midodrine (PROAMATINE) 5 MG tablet TAKE 1 TABLET(5 MG) BY MOUTH TWICE DAILY WITH A MEAL  . oxyCODONE-acetaminophen (PERCOCET/ROXICET) 5-325 MG tablet Take 1 tablet by mouth every 6 (six) hours as needed. (Patient taking differently: Take 1 tablet by mouth every 6 (six) hours as needed for moderate pain. )  . pantoprazole (PROTONIX) 40 MG tablet Take 1 tablet (40 mg total) by mouth 2 (two) times daily before a meal.  . pentoxifylline (TRENTAL) 400 MG CR tablet Take 1 tablet (400 mg total) by mouth 3 (three) times daily with meals.  [name] QUEtiapine (SEROQUEL) 25 MG tablet Take 0.5 tablets (12.5 mg total) by mouth at bedtime.  . senna (SENOKOT) 8.6 MG TABS tablet Take 1 tablet (8.6 mg total) by mouth daily. (Patient taking differently: Take 1 tablet by mouth daily as needed for mild constipation. )  . SENSIPAR 30 MG tablet Take 30 mg by mouth every evening.   . silver sulfADIAZINE (SSD) 1 % cream APPLY EXTERNALLY TO THE AFFECTED AREA DAILY (Patient taking differently: Apply 1 application topically daily as needed (skin care). )  . sorbitol 70 % SOLN Take 30 mLs by mouth 2 (two) times daily. (Patient taking differently: Take 30 mLs by mouth 2 (two) times daily as needed for moderate constipation. )  . traMADol (ULTRAM) 50 MG tablet Take 50 mg by mouth every 6 (six) hours as needed for moderate pain.  [name] warfarin (COUMADIN) 2.5 MG tablet Take as directed by Anticoagulation Clinic (Patient taking differently: Take 1.25-2.5 mg by mouth every evening. Take 2.5 mg daily except  Saturday 1.25 mg.)    FAMILY HISTORY:  His indicated that his mother is deceased. He indicated that his father is deceased. He indicated that all of his three sisters are alive. He indicated that his brother is alive. He indicated that his maternal grandmother is deceased. He indicated that his maternal grandfather is deceased. He indicated that his paternal grandmother is deceased. He indicated that his paternal grandfather is deceased. He indicated that both of his daughters are alive. He indicated that the status of his other is unknown.   SOCIAL HISTORY: He  reports that he has never smoked. He has never used smokeless tobacco. He reports that he does not drink alcohol or use drugs.  SUBJECTIVE:  Patient is asleep and minimally able to be aroused   VITAL SIGNS: BP (!) 84/55   Pulse 80   Temp (!) 102 F (38.9 C) (Rectal)   Resp 20   Wt 204 lb 12.9 oz (92.9 kg)   SpO2 97%   BMI 29.39 kg/m   INTAKE / OUTPUT: No intake/output data recorded.  PHYSICAL EXAMINATION: Physical Exam  Constitutional: He appears well-developed and well-nourished. No distress.  HENT:  Head: Normocephalic and atraumatic.  Cardiovascular: Normal rate, regular rhythm and normal heart sounds.  Respiratory: Effort normal and breath sounds normal. No respiratory distress. He has no wheezes.  GI: Soft. He exhibits distension. There is tenderness (mild amount in lower abdomen).  Musculoskeletal: He exhibits no edema.  Neurological:  Lethargic  Right upper extremity flaccid, left upper extremity 2/5 strength  Skin: He is not diaphoretic. No erythema.  LABS:  BMET Recent Labs  Lab 11/21/17 1832 11/21/17 1850  NA 133* 135  K 5.6* 5.5*  CL 101 94*  CO2  --  20*  BUN 51* 46*  CREATININE 9.60* 9.58*  GLUCOSE 183* 189*    Electrolytes Recent Labs  Lab 11/21/17 1850  CALCIUM 10.6*    CBC Recent Labs  Lab 11/21/17 1832 11/21/17 1850  WBC  --  6.5  HGB 17.7* 14.7  HCT 52.0 45.8  PLT   --  173    Coag's Recent Labs  Lab 11/15/17 1144 11/21/17 1850  APTT  --  40*  INR 3.8 2.51    Sepsis Markers Recent Labs  Lab 11/21/17 2307 11/22/17 0107  LATICACIDVEN 2.16* 5.64*    ABG Recent Labs  Lab 11/22/17 0226 11/22/17 0535  PHART 7.462* 7.375  PCO2ART 24.8* 33.2  PO2ART 45.0* 72.0*    Liver Enzymes Recent Labs  Lab 11/21/17 1850  AST 35  ALT 16*  ALKPHOS 56  BILITOT 0.6  ALBUMIN 3.8    Cardiac Enzymes No results for input(s): TROPONINI, PROBNP in the last 168 hours.  Glucose Recent Labs  Lab 11/21/17 1826 11/22/17 0338  GLUCAP 184* 163*    Imaging Dg Chest Port 1 View  Result Date: 11/21/2017 CLINICAL DATA:  Fever and altered mentation. EXAM: PORTABLE CHEST 1 VIEW COMPARISON:  07/07/2017 FINDINGS: Dual lumen left IJ dialysis catheter is stable with tip in the right atrium. Right-sided pacemaker apparatus with right atrial and right ventricular leads redemonstrated. Clear lungs. Normal size cardiac and mediastinal contours. No acute nor suspicious osseous abnormality. IMPRESSION: No active cardiopulmonary disease. Electronically Signed   By: Ashley Royalty M.D.   On: 11/21/2017 21:06   Ct Head Code Stroke Wo Contrast  Result Date: 11/21/2017 CLINICAL DATA:  Code stroke. Right-sided facial droop. Not following commands. EXAM: CT HEAD WITHOUT CONTRAST TECHNIQUE: Contiguous axial images were obtained from the base of the skull through the vertex without intravenous contrast. COMPARISON:  07/07/2017 FINDINGS: Brain: No evidence of acute infarction, hemorrhage, hydrocephalus, extra-axial collection or mass lesion/mass effect. Atrophy, most notable at the brainstem. Extensive chronic small vessel ischemic change in the cerebral white matter. Vascular: Diffuse atherosclerotic calcification. No hyperdense vessel. Skull: No acute or aggressive finding Sinuses/Orbits: Bilateral cataract resection.  No acute finding Other: These results were communicated to Dr.  Lorraine Lax at 6:35 pmon 4/28/2019by text page via the [facility] messaging system. ASPECTS [facility] Stroke Program Early CT Score) - Ganglionic level infarction (caudate, lentiform nuclei, internal capsule, insula, M1-M3 cortex): 7 - Supraganglionic infarction (M4-M6 cortex): 3 Total score (0-10 with 10 being normal): 10 IMPRESSION: 1. No acute finding. 2. Atrophy most prominent at the brainstem. Extensive chronic small vessel ischemia. Electronically Signed   By: Monte Fantasia M.D.   On: 11/21/2017 18:39   CULTURES: Blood cultures( 4/28) pending Blood culture (4/29) pending RVP (4/29) pending Sputum cultures (4/29) pending  Line culture (4/29) to be drawn  ANTIBIOTICS: Meropenem (4/28)> Vancomycin (4/28)>  SIGNIFICANT EVENTS:  LINES/TUBES: Right peripheral IV Left peripheral IV Left tunneled IJ HD cath  ASSESSMENT / PLAN:  PULMONARY A: None  P:    CARDIOVASCULAR A:  Hypotension H/o HFrEF H/o Atrial fibrillation Increased QT=623  P:  Cardiac monitoring Daily weights I/O Continue warfarin  Continue amiodarone Neo gtt  RENAL A:   ESRD on HD TTS with last session being a partial session Metabolic alkalosis Hyperkalemia (K=5.5)  P:   Consult nephrology regarding dialysis  GASTROINTESTINAL A:   GI  prophylaxis due to coagulopathy NPO  P:   Diet NPO IV Famotidine  HEMATOLOGIC A:   None  P:  Monitor CBC  INFECTIOUS A:   Sepsis of unknown source  P:   Procalcitonin pending Blood culture (4/28) pending UA pending  Continue vancomycin (4/28) Continue meropenem (4/28) thought to be due to anaphylactic allergy to pcn and cephalosporins also pt has elevated QT Lactic acid 2.16>5.64  ENDOCRINE A:   Diabetes Mellitus  P:   Cortisol=16.6 SSI  NEUROLOGIC A:   CVA vs seizure  Acute encephalopathy in setting of septic shock   P:   UDS pending  EEG pending Keppra 500mg  BID, seizure precautions Appreciate neurology following- recommended LP as there  is no clear source. Patient currently has a therapeutic INR. Pending MRI and EEG Ammonia=29  FAMILY   - Inter-disciplinary family meet or Palliative Care meeting due by:  Pulmonary and [place] Pager: [phone]  11/22/2017, 6:38 AM

## 2017-11-22 NOTE — Consult Note (Addendum)
[facility] Renal Consultation Note    Indication for Consultation:  Management of ESRD/hemodialysis; anemia, hypertension/volume and secondary hyperparathyroidism   HPI: Oscar Castillo is a 67 y.o. male with ESRD on HD (TTS [place]) DM Type 2, HTN, Hx CVA, paroxsymal Afib on chronic Coumadin,  Hep C, Hx complete heart block s/p PPM, bilat BKA.   Presented to ED with facial droop/AMS. Stroke code called. No acute findings on head CT. ED course significant for fever temp 104.36F, tachycardia, hypotension.  CXR neg. Labs Na 137, K 4.6, CO2 22, WBC 6.5>11.2, Hgb 12.2, procalcitonin 40.31, Admitted to ICU for sepsis workup. Blood cultures drawn. IV Vanc/meropenem started.   Seen in ICU, sleeping, poorly responsive. Wife reports increased fatigue and chills prior to admission.   Dialyzes via L IJ TDC. He had 2nd stage L BVT in 03/2017 that was found to be clotted in 06/2017. Underwent L axillary exploration with Dr. Bridgett Larsson and found to have no adequate L veins.  He saw Dr. Bridgett Larsson in January and offered RA AVG, but declined at that time.   Last dialysis was Saturday for 3 hours.  Has not missed any dialysis and usually compliant with treatment time. Chronically hypotensive and requires midodrine prior to dialysis.   Past Medical History:  Diagnosis Date  . Anemia   . Antral ulcer 2014   small  . BENIGN PROSTATIC HYPERTROPHY   . Bipolar disorder ([place])    "sometimes" (10/07/2016)  . CHOLELITHIASIS   . Chronic combined systolic and diastolic CHF (congestive heart failure) ([place])   . Complication of anesthesia    wife states pt had trouble waking up with in Nov., 2014  . CVA (cerebral vascular accident) ([place]) 07/2007   No residual effect  . DEPRESSION   . DIABETES MELLITUS, TYPE II    diet control.  No medication  since November 2015  . ERECTILE DYSFUNCTION   . ESRD on hemodialysis ([place])    ESRD due to DM/HTN. .  HD TTS at [facility] on [address].  [name] GERD    . GI bleed    due to gastritis, discharged 10/02/16/notes 10/07/2016  . Hepatitis C    C - has been treated  . History of Clostridium difficile   . History of kidney stones   . HYPERTENSION   . LBBB (left bundle branch block)   . Morbid obesity ([place])   . PAF (paroxysmal atrial fibrillation) ([place])    a. Dx 12/2015.   Past Surgical History:  Procedure Laterality Date  . AMPUTATION Left 05/12/2013   Procedure: AMPUTATION RAY;  Surgeon: Newt Minion, MD;  Location: [place];  Service: Orthopedics;  Laterality: Left;  Left Foot 1st Ray Amputation  . AMPUTATION Left 06/09/2013   Procedure: AMPUTATION BELOW KNEE;  Surgeon: Newt Minion, MD;  Location: [place];  Service: Orthopedics;  Laterality: Left;  Left Below Knee Amputation and removal proximal screws IM tibial nail  . AMPUTATION Right 09/08/2013   Procedure: AMPUTATION BELOW KNEE;  Surgeon: Newt Minion, MD;  Location: [place];  Service: Orthopedics;  Laterality: Right;  Right Below Knee Amputation  . AMPUTATION Right 10/11/2013   Procedure: AMPUTATION BELOW KNEE;  Surgeon: Newt Minion, MD;  Location: [place];  Service: Orthopedics;  Laterality: Right;  Right Below Knee Amputation Revision  . AV FISTULA PLACEMENT  06/14/2012   Procedure: ARTERIOVENOUS (AV) FISTULA CREATION;  Surgeon: Angelia Mould, MD;  Location: [place];  Service: Vascular;  Laterality: Left;  Left basilic vein transposition with fistula.  . AV FISTULA PLACEMENT Left 07/21/2017   Procedure: Attempt INSERTION OF ARTERIOVENOUS (AV) WITH  ARTEGRAFT Left  UPPER ARM;  Surgeon: Conrad Hawthorne, MD;  Location: [place];  Service: Vascular;  Laterality: Left;  . BASCILIC VEIN TRANSPOSITION Left 02/17/2017   Procedure: LEFT 1ST STAGE BRACHIAL VEIN TRANSPOSITION;  Surgeon: Conrad Forestville, MD;  Location: [place];  Service: Vascular;  Laterality: Left;  . BASCILIC VEIN TRANSPOSITION Left 04/21/2017   Procedure: LEFT 2ND STAGE BRACHIAL VEIN TRANSPOSITION;  Surgeon: Conrad Santa Cruz, MD;  Location:  [place];  Service: Vascular;  Laterality: Left;  . COLONOSCOPY N/A 10/28/2012   Procedure: COLONOSCOPY;  Surgeon: Jeryl Columbia, MD;  Location: [facility] ENDOSCOPY;  Service: Endoscopy;  Laterality: N/A;  . COLONOSCOPY N/A 11/02/2012   Procedure: COLONOSCOPY;  Surgeon: Cleotis Nipper, MD;  Location: [facility] ENDOSCOPY;  Service: Endoscopy;  Laterality: N/A;  . COLONOSCOPY N/A 11/03/2012   Procedure: COLONOSCOPY;  Surgeon: Cleotis Nipper, MD;  Location: [facility] ENDOSCOPY;  Service: Endoscopy;  Laterality: N/A;  . COLONOSCOPY N/A 09/16/2016   Procedure: COLONOSCOPY;  Surgeon: Jerene Bears, MD;  Location: [facility] ENDOSCOPY;  Service: Gastroenterology;  Laterality: N/A;  . ENTEROSCOPY N/A 11/08/2012   Procedure: ENTEROSCOPY;  Surgeon: Wonda Horner, MD;  Location: [facility] ENDOSCOPY;  Service: Endoscopy;  Laterality: N/A;  . ESOPHAGOGASTRODUODENOSCOPY N/A 11/02/2012   Procedure: ESOPHAGOGASTRODUODENOSCOPY (EGD);  Surgeon: Cleotis Nipper, MD;  Location: [facility] ENDOSCOPY;  Service: Endoscopy;  Laterality: N/A;  . ESOPHAGOGASTRODUODENOSCOPY (EGD) WITH PROPOFOL N/A 09/16/2016   Procedure: ESOPHAGOGASTRODUODENOSCOPY (EGD) WITH PROPOFOL;  Surgeon: Jerene Bears, MD;  Location: [facility] ENDOSCOPY;  Service: Gastroenterology;  Laterality: N/A;  . EXCHANGE OF A DIALYSIS CATHETER Left 11/13/2016   Procedure: EXCHANGE OF A DIALYSIS CATHETER;  Surgeon: Elam Dutch, MD;  Location: [place];  Service: Vascular;  Laterality: Left;  . EYE SURGERY Left    to remove scar tissue  . GIVENS CAPSULE STUDY N/A 11/04/2012   Procedure: GIVENS CAPSULE STUDY;  Surgeon: Cleotis Nipper, MD;  Location: [facility] ENDOSCOPY;  Service: Endoscopy;  Laterality: N/A;  . GIVENS CAPSULE STUDY N/A 09/29/2016   Procedure: GIVENS CAPSULE STUDY;  Surgeon: Manus Gunning, MD;  Location: [place];  Service: Gastroenterology;  Laterality: N/A;  try to keep pt up in bedside chair as much as possible during the study.    [name] HARDWARE REMOVAL Left 06/09/2013   Procedure: HARDWARE  REMOVAL;  Surgeon: Newt Minion, MD;  Location: [place];  Service: Orthopedics;  Laterality: Left;  Left Below Knee Amputation  and Removal proximal screws IM tibial nail  . HEMATOMA EVACUATION Left 04/29/2017   Procedure: EVACUATION HEMATOMA LEFT ARM;  Surgeon: Conrad Berwyn, MD;  Location: [place];  Service: Vascular;  Laterality: Left;  . INSERTION OF DIALYSIS CATHETER N/A 11/09/2016   Procedure: INSERTION OF TEMPORARY DIALYSIS CATHETER;  Surgeon: Elam Dutch, MD;  Location: [place];  Service: Vascular;  Laterality: N/A;  . IR GENERIC HISTORICAL  09/27/2016   IR US GUIDE VASC ACCESS RIGHT 09/27/2016 Aletta Edouard, MD [facility]  . IR GENERIC HISTORICAL  09/27/2016   IR FLUORO GUIDE CV LINE RIGHT 09/27/2016 Aletta Edouard, MD [facility]  . LIGATION OF ARTERIOVENOUS  FISTULA Left 11/09/2016   Procedure: LIGATION OF ARTERIOVENOUS  FISTULA;  Surgeon: Elam Dutch, MD;  Location: [place];  Service: Vascular;  Laterality: Left;  . ORIF FIBULA FRACTURE Left 09/09/2012  Procedure: OPEN REDUCTION INTERNAL FIXATION (ORIF) FIBULA FRACTURE;  Surgeon: Johnny Bridge, MD;  Location: [place];  Service: Orthopedics;  Laterality: Left;  . PACEMAKER IMPLANT N/A 04/30/2017   Procedure: PACEMAKER IMPLANT;  Surgeon: Evans Lance, MD;  Location: [place] CV LAB;  Service: Cardiovascular;  Laterality: N/A;  . TEMPORARY PACEMAKER N/A 04/29/2017   Procedure: TEMPORARY PACEMAKER;  Surgeon: Lorretta Harp, MD;  Location: [place] CV LAB;  Service: Cardiovascular;  Laterality: N/A;  . TIBIA IM NAIL INSERTION Left 09/09/2012   Procedure: INTRAMEDULLARY (IM) NAIL TIBIAL;  Surgeon: Johnny Bridge, MD;  Location: [place];  Service: Orthopedics;  Laterality: Left;  left tibial nail and open reduction internal fixation left fibula fracture  . UPPER EXTREMITY VENOGRAPHY N/A 12/14/2016   Procedure: Bilateral Upper Extremity Venography and Central Venography;  Surgeon: Conrad Maumee, MD;  Location: [place] CV LAB;   Service: Cardiovascular;  Laterality: N/A;   Family History  Problem Relation Age of Onset  . Diabetes Mother   . Hypertension Mother   . Heart attack Father   . Hypertension Father   . Coronary artery disease Other    Social History:  reports that he has never smoked. He has never used smokeless tobacco. He reports that he does not drink alcohol or use drugs. Allergies  Allergen Reactions  . Ceftriaxone Sodium In Dextrose Anaphylaxis  . Ciprofloxacin Other (See Comments)    UNSPECIFIED PAIN  . Nsaids Other (See Comments)    HISTORY OF ULCER  . Penicillins Swelling    SWELLING REACTION UNSPECIFIED  [ PATIENT WITH HX OF ANAPHYLAXIS TO CEFTRIAXONE ]  . Sulfa Antibiotics Swelling    SWELLING REACTION UNSPECIFIED    Prior to Admission medications   Medication Sig Start Date End Date Taking? Authorizing Provider  acetaminophen (TYLENOL) 500 MG tablet Take 500 mg by mouth every 6 (six) hours as needed for mild pain.   Yes [provider]  amiodarone (PACERONE) 200 MG tablet Take 1 tablet (200 mg total) by mouth daily. 11/16/17  Yes Croitoru, Mihai, MD  atorvastatin (LIPITOR) 40 MG tablet TAKE 1 TABLET(40 MG) BY MOUTH DAILY AT 6 PM 11/16/17  Yes [name], MD  calcium acetate (PHOSLO) 667 MG capsule Take 1,334 mg by mouth 2 (two) times daily.    Yes [provider]  gabapentin (NEURONTIN) 300 MG capsule Take 1 capsule (300 mg total) by mouth 3 (three) times daily. 3 times a day when necessary neuropathy pain Patient taking differently: Take 300 mg by mouth 2 (two) times daily.  06/28/17  Yes Newt Minion, MD  latanoprost (XALATAN) 0.005 % ophthalmic solution Place 1 drop into both eyes at bedtime.  06/12/16  Yes [provider]  midodrine (PROAMATINE) 5 MG tablet TAKE 1 TABLET(5 MG) BY MOUTH TWICE DAILY WITH A MEAL 07/12/17  Yes [name], MD  oxyCODONE-acetaminophen (PERCOCET/ROXICET) 5-325 MG tablet Take 1 tablet by mouth every 6 (six) hours as  needed. Patient taking differently: Take 1 tablet by mouth every 6 (six) hours as needed for moderate pain.  07/21/17  Yes Laurence Slate M, PA-C  pantoprazole (PROTONIX) 40 MG tablet Take 1 tablet (40 mg total) by mouth 2 (two) times daily before a meal. 10/14/16  Yes [name], MD  pentoxifylline (TRENTAL) 400 MG CR tablet Take 1 tablet (400 mg total) by mouth 3 (three) times daily with meals. 08/23/17  Yes Suzan Slick, NP  QUEtiapine (SEROQUEL) 25 MG tablet  Take 0.5 tablets (12.5 mg total) by mouth at bedtime. 05/09/17  Yes Nita Sells, MD  senna (SENOKOT) 8.6 MG TABS tablet Take 1 tablet (8.6 mg total) by mouth daily. Patient taking differently: Take 1 tablet by mouth daily as needed for mild constipation.  05/09/17  Yes Nita Sells, MD  SENSIPAR 30 MG tablet Take 30 mg by mouth every evening.  01/09/15  Yes [provider]  silver sulfADIAZINE (SSD) 1 % cream APPLY EXTERNALLY TO THE AFFECTED AREA DAILY Patient taking differently: Apply 1 application topically daily as needed (skin care).  07/30/17  Yes Dondra Prader R, NP  sorbitol 70 % SOLN Take 30 mLs by mouth 2 (two) times daily. Patient taking differently: Take 30 mLs by mouth 2 (two) times daily as needed for moderate constipation.  05/09/17  Yes Nita Sells, MD  traMADol (ULTRAM) 50 MG tablet Take 50 mg by mouth every 6 (six) hours as needed for moderate pain.   Yes [provider]  warfarin (COUMADIN) 2.5 MG tablet Take as directed by Anticoagulation Clinic Patient taking differently: Take 1.25-2.5 mg by mouth every evening. Take 2.5 mg daily except Saturday 1.25 mg. 05/27/17  Yes Fay Records, MD  midodrine (PROAMATINE) 5 MG tablet TAKE 1 TABLET(5 MG) BY MOUTH TWICE DAILY WITH A MEAL 11/22/17   [name], MD   Current Facility-Administered Medications  Medication Dose Route Frequency Provider Last Rate Last Dose  . 0.9 %  sodium chloride infusion  250 mL Intravenous PRN Hammonds,  Sharyn Blitz, MD      . acetaminophen (TYLENOL) tablet 650 mg  650 mg Oral Q6H PRN Hammonds, Sharyn Blitz, MD       Or  . acetaminophen (TYLENOL) suppository 650 mg  650 mg Rectal Q6H PRN Hammonds, Sharyn Blitz, MD   650 mg at 11/22/17 0447  . acyclovir (ZOVIRAX) 460 mg in dextrose 5 % 100 mL IVPB  460 mg Intravenous Q24H Millen, Jessica B, RPH      . albuterol (PROVENTIL) (2.5 MG/3ML) 0.083% nebulizer solution 2.5 mg  2.5 mg Nebulization Q2H PRN Hammonds, Sharyn Blitz, MD      . amiodarone (PACERONE) tablet 200 mg  200 mg Oral Daily Vilma Prader, MD   Stopped at 11/22/17 1050  . famotidine (PEPCID) IVPB 20 mg premix  20 mg Intravenous Daily Hammonds, Sharyn Blitz, MD   Stopped at 11/22/17 1123  . heparin flush 10 UNIT/ML injection 10 Units  10 Units Intravenous Once Collier Salina, MD      . heparin injection 1,000-6,000 Units  1,000-6,000 Units Intracatheter PRN Hammonds, Sharyn Blitz, MD   1,900 Units at 11/22/17 0905  . insulin aspart (novoLOG) injection 2-6 Units  2-6 Units Subcutaneous Q4H Hammonds, Sharyn Blitz, MD   4 Units at 11/22/17 0355  . MEDLINE mouth rinse  15 mL Mouth Rinse BID Simonne Maffucci B, MD   15 mL at 11/22/17 1051  . meropenem (MERREM) 500 mg in sodium chloride 0.9 % 100 mL IVPB  500 mg Intravenous Q24H Carmin Muskrat, MD   Stopped at 11/21/17 2355  . ondansetron (ZOFRAN) injection 4 mg  4 mg Intravenous Q6H PRN Hammonds, Sharyn Blitz, MD      . phenylephrine (NEO-SYNEPHRINE) 10 mg in sodium chloride 0.9 % 250 mL (0.04 mg/mL) infusion  20-200 mcg/min Intravenous Titrated Hammonds, Sharyn Blitz, MD 45 mL/hr at 11/22/17 1134 30 mcg/min at 11/22/17 1134  . sodium chloride flush (NS) 0.9 % injection 3 mL  3 mL  Intravenous Q12H Vilma Prader, MD   3 mL at 11/22/17 1046  . [START ON 11/23/2017] vancomycin (VANCOCIN) IVPB 1000 mg/200 mL premix  1,000 mg Intravenous Q T,Th,Sa-HD Ledford, James L, RPH        ROS: As per HPI otherwise negative.  Physical Exam: Vitals:    11/22/17 1200 11/22/17 1300 11/22/17 1330 11/22/17 1345  BP: 116/72 106/77 116/73 123/71  Pulse:  69 69 69  Resp: (!) 21 19 19 19   Temp:      TempSrc:      SpO2:  100% 100% 100%  Weight:         General: Ill appearing WDWN male  Head: NCAT sclera not icteric  Neck: Supple. No No masses Lungs: Diminished bilat Heart: RRR with S1 S2 Abdomen: soft NT + BS Lower extremities: bilat BKA, No stump edema  Neuro: Minimally responsive  Psych:  Lethargic, not responsive to  questions Dialysis Access: L chest TDC no exit site drainage  Labs: Basic Metabolic Panel: Recent Labs  Lab 11/21/17 1832 11/21/17 1850 11/22/17 0612  NA 133* 135 137  K 5.6* 5.5* 4.6  CL 101 94* 105  CO2  --  20* 22  GLUCOSE 183* 189* 136*  BUN 51* 46* 51*  CREATININE 9.60* 9.58* 10.40*  CALCIUM  --  10.6* 9.2  PHOS  --   --  2.9   Liver Function Tests: Recent Labs  Lab 11/21/17 1850 11/22/17 0612  AST 35 36  ALT 16* 13*  ALKPHOS 56 41  BILITOT 0.6 0.7  PROT 8.7* 6.9  ALBUMIN 3.8 3.0*   No results for input(s): LIPASE, AMYLASE in the last 168 hours. Recent Labs  Lab 11/22/17 0612  AMMONIA 29   CBC: Recent Labs  Lab 11/21/17 1832 11/21/17 1850 11/22/17 0612  WBC  --  6.5 11.2*  NEUTROABS  --  5.3  --   HGB 17.7* 14.7 12.2*  HCT 52.0 45.8 38.2*  MCV  --  93.5 93.6  PLT  --  173 167   Cardiac Enzymes: No results for input(s): CKTOTAL, CKMB, CKMBINDEX, TROPONINI in the last 168 hours. CBG: Recent Labs  Lab 11/21/17 1826 11/22/17 0338 11/22/17 0742 11/22/17 1145  GLUCAP 184* 163* 116* 115*   [place] Studies: No results for input(s): IRON, TIBC, TRANSFERRIN, FERRITIN in the last 72 hours. Studies/Results: Mr Brain Wo Contrast  Result Date: 11/22/2017 CLINICAL DATA:  67 year old male with confusion. Code stroke presentation yesterday with right side facial droop. EXAM: MRI HEAD WITHOUT CONTRAST TECHNIQUE: Multiplanar, multiecho pulse sequences of the brain and surrounding structures  were obtained without intravenous contrast. COMPARISON:  Head CT without contrast 11/21/2017 and earlier. Brain MRI 12/27/2015. FINDINGS: Brain: No restricted diffusion to suggest acute infarction. No midline shift, mass effect, evidence of mass lesion, ventriculomegaly, extra-axial collection or acute intracranial hemorrhage. Cervicomedullary junction and pituitary are within normal limits. Stable cerebral volume since 2017 with disproportionate atrophy of the brainstem. There is stable T2 heterogeneity throughout the mid and dorsal pons compatible with chronic lacunar infarcts. Cerebellar volume appears better preserved. Advanced chronic lacunar infarcts in the bilateral thalami some with associated chronic microhemorrhage. The basal ganglia are relatively spared. There is chronic Patchy and confluent supratentorial cerebral white matter T2 and FLAIR hyperintensity. No new signal abnormality compared to the 2017 MRI. Vascular: Major intracranial vascular flow voids are stable. Skull and upper cervical spine: Stable and negative. Normal bone marrow signal. Sinuses/Orbits: Interval postoperative changes to the right globe. Otherwise stable and negative.  Other: A mild-to-moderate left mastoid effusion is new since 2018. Negative nasopharynx. The right mastoids remain clear. Grossly normal other visible internal auditory structures. Scalp and face soft tissues appear negative. IMPRESSION: 1.  No acute intracranial abnormality. 2. Chronic brainstem atrophy with evidence of advanced chronic small vessel disease in the thalami and pons appears stable since a 2017 MRI. 3. Left mastoid effusion is new since 2018 and probably postinflammatory. Electronically Signed   By: Genevie Ann M.D.   On: 11/22/2017 11:28   Dg Chest Port 1 View  Result Date: 11/21/2017 CLINICAL DATA:  Fever and altered mentation. EXAM: PORTABLE CHEST 1 VIEW COMPARISON:  07/07/2017 FINDINGS: Dual lumen left IJ dialysis catheter is stable with tip in the  right atrium. Right-sided pacemaker apparatus with right atrial and right ventricular leads redemonstrated. Clear lungs. Normal size cardiac and mediastinal contours. No acute nor suspicious osseous abnormality. IMPRESSION: No active cardiopulmonary disease. Electronically Signed   By: Ashley Royalty M.D.   On: 11/21/2017 21:06   Ct Head Code Stroke Wo Contrast  Result Date: 11/21/2017 CLINICAL DATA:  Code stroke. Right-sided facial droop. Not following commands. EXAM: CT HEAD WITHOUT CONTRAST TECHNIQUE: Contiguous axial images were obtained from the base of the skull through the vertex without intravenous contrast. COMPARISON:  07/07/2017 FINDINGS: Brain: No evidence of acute infarction, hemorrhage, hydrocephalus, extra-axial collection or mass lesion/mass effect. Atrophy, most notable at the brainstem. Extensive chronic small vessel ischemic change in the cerebral white matter. Vascular: Diffuse atherosclerotic calcification. No hyperdense vessel. Skull: No acute or aggressive finding Sinuses/Orbits: Bilateral cataract resection.  No acute finding Other: These results were communicated to Dr. Lorraine Lax at 6:35 pmon 4/28/2019by text page via the [facility] messaging system. ASPECTS [facility] Stroke Program Early CT Score) - Ganglionic level infarction (caudate, lentiform nuclei, internal capsule, insula, M1-M3 cortex): 7 - Supraganglionic infarction (M4-M6 cortex): 3 Total score (0-10 with 10 being normal): 10 IMPRESSION: 1. No acute finding. 2. Atrophy most prominent at the brainstem. Extensive chronic small vessel ischemia. Electronically Signed   By: Monte Fantasia M.D.   On: 11/21/2017 18:39    Dialysis Orders:  [place] TTS 4h 180NRe 400/800 EDW 91.5kg 2K/2.5Ca Profile 4 Na Linear TDC Heparin 4000 U Bolus, 2000 U mid-treatment Venofer 50mg  IV q week   Assessment/Plan: 1. Septic shock -  Blood cultures pending; Had set drawn from dialysis catheter this am. On IV vanc/meropenem. neo-synephrine per primary  2. AMS  - Concern for CVA vs seizure d/t #1. No acute findings on Head CT/MRI - Per neuro  3. ESRD -  TTS schedule. Plan for HD tomorrow if BP allows  4. Hypotension/volume  - Chronic hypotension on midodrine 10 bid as OP 5. Anemia  - Hgb 12.2. No ESA needs  6. Metabolic bone disease -  Ca acetate 3 tabs tid - resume with diet  7. Afib  - On Coumadin/amoidarone  8. DM Type 2  - per primary   Lynnda Child PA-C [place] Pager [phone] 11/22/2017, 2:17 PM   Pt seen, examined and agree w A/P as above.  Kelly Splinter MD [employer] pager [phone]   11/22/2017, 4:28 PM

## 2017-11-22 NOTE — Progress Notes (Signed)
Pharmacy Antibiotic Note  Oscar Castillo is a 67 y.o. male admitted on 11/21/2017 with meningitis.  Pharmacy has been consulted for Acyclovir dosing.  Assessment: Patient currently on vancomycin and Merrem, adding acyclovir for CNS coverage for meningitis using hemodialysis dosing. Patient is anuric at baseline, WBC worsened since yesterday 6.5 > 11.2, without fever. BUN elevated at 51, PCT elevated at 40.31, LA improving 1.5 > 1.0, liver function stable.  Plan: Start acyclovir 460mg  IV Q24H (5mg /kg/dose using TBW) Continue vancomycin 1000mg  IV qHD (Tu/Th/Sa) Continue Merrem 500mg  IV Q24H Monitor CBC, renal and hepatic function, LP, cultures, and LOT.  Weight: 204 lb 12.9 oz (92.9 kg)  Temp (24hrs), Avg:101.4 F (38.6 C), Min:98.3 F (36.8 C), Max:104.7 F (40.4 C)  Recent Labs  Lab 11/21/17 1832 11/21/17 1850 11/21/17 2307 11/22/17 0107 11/22/17 0612 11/22/17 0852 11/22/17 1143  WBC  --  6.5  --   --  11.2*  --   --   CREATININE 9.60* 9.58*  --   --  10.40*  --   --   LATICACIDVEN  --   --  2.16* 5.64* 1.5 1.0 1.0    Estimated Creatinine Clearance: 8 mL/min (A) (by C-G formula based on SCr of 10.4 mg/dL (H)).    Allergies  Allergen Reactions  . Ceftriaxone Sodium In Dextrose Anaphylaxis  . Ciprofloxacin Other (See Comments)    UNSPECIFIED PAIN  . Nsaids Other (See Comments)    HISTORY OF ULCER  . Penicillins Swelling    SWELLING REACTION UNSPECIFIED  [ PATIENT WITH HX OF ANAPHYLAXIS TO CEFTRIAXONE ]  . Sulfa Antibiotics Swelling    SWELLING REACTION UNSPECIFIED     Antimicrobials this admission: 4/28 Vanc >> 4/28 Merrem >> 4/29 Acyclovir >>  Dose adjustments this admission: None  Microbiology results: 4/28 BCx: NGTD 4/29 RVP: neg 4/29 MRSA PCR: neg 4/29 BCx: pending  Thank you for allowing pharmacy to be a part of this patient's care.  Levonne Lapping, PharmD Candidate 11/22/2017 2:40 PM

## 2017-11-22 NOTE — Evaluation (Signed)
Clinical/Bedside Swallow Evaluation Patient Details  Name: Oscar Castillo MRN: [id number] Date of Birth: 25-Jan-1951  Today's Date: 11/22/2017 Time: SLP Start Time (ACUTE ONLY): 3419 SLP Stop Time (ACUTE ONLY): 0907 SLP Time Calculation (min) (ACUTE ONLY): 14 min  Past Medical History:  Past Medical History:  Diagnosis Date  . Anemia   . Antral ulcer 2014   small  . BENIGN PROSTATIC HYPERTROPHY   . Bipolar disorder ([place])    "sometimes" (10/07/2016)  . CHOLELITHIASIS   . Chronic combined systolic and diastolic CHF (congestive heart failure) ([place])   . Complication of anesthesia    wife states pt had trouble waking up with in Nov., 2014  . CVA (cerebral vascular accident) ([place]) 07/2007   No residual effect  . DEPRESSION   . DIABETES MELLITUS, TYPE II    diet control.  No medication  since November 2015  . ERECTILE DYSFUNCTION   . ESRD on hemodialysis ([place])    ESRD due to DM/HTN. .  HD TTS at [facility] on [address].  [name] GERD   . GI bleed    due to gastritis, discharged 10/02/16/notes 10/07/2016  . Hepatitis C    C - has been treated  . History of Clostridium difficile   . History of kidney stones   . HYPERTENSION   . LBBB (left bundle branch block)   . Morbid obesity ([place])   . PAF (paroxysmal atrial fibrillation) ([place])    a. Dx 12/2015.   Past Surgical History:  Past Surgical History:  Procedure Laterality Date  . AMPUTATION Left 05/12/2013   Procedure: AMPUTATION RAY;  Surgeon: Newt Minion, MD;  Location: [place];  Service: Orthopedics;  Laterality: Left;  Left Foot 1st Ray Amputation  . AMPUTATION Left 06/09/2013   Procedure: AMPUTATION BELOW KNEE;  Surgeon: Newt Minion, MD;  Location: [place];  Service: Orthopedics;  Laterality: Left;  Left Below Knee Amputation and removal proximal screws IM tibial nail  . AMPUTATION Right 09/08/2013   Procedure: AMPUTATION BELOW KNEE;  Surgeon: Newt Minion, MD;  Location: [place];  Service: Orthopedics;  Laterality: Right;  Right Below  Knee Amputation  . AMPUTATION Right 10/11/2013   Procedure: AMPUTATION BELOW KNEE;  Surgeon: Newt Minion, MD;  Location: [place];  Service: Orthopedics;  Laterality: Right;  Right Below Knee Amputation Revision  . AV FISTULA PLACEMENT  06/14/2012   Procedure: ARTERIOVENOUS (AV) FISTULA CREATION;  Surgeon: Angelia Mould, MD;  Location: [facility] OR;  Service: Vascular;  Laterality: Left;  Left basilic vein transposition with fistula.  . AV FISTULA PLACEMENT Left 07/21/2017   Procedure: Attempt INSERTION OF ARTERIOVENOUS (AV) WITH  ARTEGRAFT Left  UPPER ARM;  Surgeon: Conrad Center, MD;  Location: [place];  Service: Vascular;  Laterality: Left;  . BASCILIC VEIN TRANSPOSITION Left 02/17/2017   Procedure: LEFT 1ST STAGE BRACHIAL VEIN TRANSPOSITION;  Surgeon: Conrad [place], MD;  Location: [place];  Service: Vascular;  Laterality: Left;  . BASCILIC VEIN TRANSPOSITION Left 04/21/2017   Procedure: LEFT 2ND STAGE BRACHIAL VEIN TRANSPOSITION;  Surgeon: Conrad Coggon, MD;  Location: [place];  Service: Vascular;  Laterality: Left;  . COLONOSCOPY N/A 10/28/2012   Procedure: COLONOSCOPY;  Surgeon: Jeryl Columbia, MD;  Location: [facility] ENDOSCOPY;  Service: Endoscopy;  Laterality: N/A;  . COLONOSCOPY N/A 11/02/2012   Procedure: COLONOSCOPY;  Surgeon: Cleotis Nipper, MD;  Location: [facility] ENDOSCOPY;  Service: Endoscopy;  Laterality: N/A;  . COLONOSCOPY N/A 11/03/2012  Procedure: COLONOSCOPY;  Surgeon: Cleotis Nipper, MD;  Location: [facility] ENDOSCOPY;  Service: Endoscopy;  Laterality: N/A;  . COLONOSCOPY N/A 09/16/2016   Procedure: COLONOSCOPY;  Surgeon: Jerene Bears, MD;  Location: [facility] ENDOSCOPY;  Service: Gastroenterology;  Laterality: N/A;  . ENTEROSCOPY N/A 11/08/2012   Procedure: ENTEROSCOPY;  Surgeon: Wonda Horner, MD;  Location: [facility] ENDOSCOPY;  Service: Endoscopy;  Laterality: N/A;  . ESOPHAGOGASTRODUODENOSCOPY N/A 11/02/2012   Procedure: ESOPHAGOGASTRODUODENOSCOPY (EGD);  Surgeon: Cleotis Nipper, MD;  Location: [facility] ENDOSCOPY;   Service: Endoscopy;  Laterality: N/A;  . ESOPHAGOGASTRODUODENOSCOPY (EGD) WITH PROPOFOL N/A 09/16/2016   Procedure: ESOPHAGOGASTRODUODENOSCOPY (EGD) WITH PROPOFOL;  Surgeon: Jerene Bears, MD;  Location: [facility] ENDOSCOPY;  Service: Gastroenterology;  Laterality: N/A;  . EXCHANGE OF A DIALYSIS CATHETER Left 11/13/2016   Procedure: EXCHANGE OF A DIALYSIS CATHETER;  Surgeon: Elam Dutch, MD;  Location: [place];  Service: Vascular;  Laterality: Left;  . EYE SURGERY Left    to remove scar tissue  . GIVENS CAPSULE STUDY N/A 11/04/2012   Procedure: GIVENS CAPSULE STUDY;  Surgeon: Cleotis Nipper, MD;  Location: [facility] ENDOSCOPY;  Service: Endoscopy;  Laterality: N/A;  . GIVENS CAPSULE STUDY N/A 09/29/2016   Procedure: GIVENS CAPSULE STUDY;  Surgeon: Manus Gunning, MD;  Location: [place];  Service: Gastroenterology;  Laterality: N/A;  try to keep pt up in bedside chair as much as possible during the study.    [name] HARDWARE REMOVAL Left 06/09/2013   Procedure: HARDWARE REMOVAL;  Surgeon: Newt Minion, MD;  Location: [place];  Service: Orthopedics;  Laterality: Left;  Left Below Knee Amputation  and Removal proximal screws IM tibial nail  . HEMATOMA EVACUATION Left 04/29/2017   Procedure: EVACUATION HEMATOMA LEFT ARM;  Surgeon: Conrad [place], MD;  Location: [place];  Service: Vascular;  Laterality: Left;  . INSERTION OF DIALYSIS CATHETER N/A 11/09/2016   Procedure: INSERTION OF TEMPORARY DIALYSIS CATHETER;  Surgeon: Elam Dutch, MD;  Location: [place];  Service: Vascular;  Laterality: N/A;  . IR GENERIC HISTORICAL  09/27/2016   IR US GUIDE VASC ACCESS RIGHT 09/27/2016 Aletta Edouard, MD [facility]-INTER[facility]  . IR GENERIC HISTORICAL  09/27/2016   IR FLUORO GUIDE CV LINE RIGHT 09/27/2016 Aletta Edouard, MD [facility]  . LIGATION OF ARTERIOVENOUS  FISTULA Left 11/09/2016   Procedure: LIGATION OF ARTERIOVENOUS  FISTULA;  Surgeon: Elam Dutch, MD;  Location: [place];  Service: Vascular;  Laterality: Left;  . ORIF  FIBULA FRACTURE Left 09/09/2012   Procedure: OPEN REDUCTION INTERNAL FIXATION (ORIF) FIBULA FRACTURE;  Surgeon: Johnny Bridge, MD;  Location: [place];  Service: Orthopedics;  Laterality: Left;  . PACEMAKER IMPLANT N/A 04/30/2017   Procedure: PACEMAKER IMPLANT;  Surgeon: Evans Lance, MD;  Location: [place] CV LAB;  Service: Cardiovascular;  Laterality: N/A;  . TEMPORARY PACEMAKER N/A 04/29/2017   Procedure: TEMPORARY PACEMAKER;  Surgeon: Lorretta Harp, MD;  Location: [place] CV LAB;  Service: Cardiovascular;  Laterality: N/A;  . TIBIA IM NAIL INSERTION Left 09/09/2012   Procedure: INTRAMEDULLARY (IM) NAIL TIBIAL;  Surgeon: Johnny Bridge, MD;  Location: [place];  Service: Orthopedics;  Laterality: Left;  left tibial nail and open reduction internal fixation left fibula fracture  . UPPER EXTREMITY VENOGRAPHY N/A 12/14/2016   Procedure: Bilateral Upper Extremity Venography and Central Venography;  Surgeon: Conrad , MD;  Location: [place] CV LAB;  Service: Cardiovascular;  Laterality: N/A;   HPI:  Pt is a  67 y.o m with ESRD on HD, Essential HTN, HCV, Afib on coumadin, Diabetes mellitus, Bipolar disorder, hx of renal stones, HFrEF, prior CVA who presented with acute encephalopathy, dysarthria, right facial droop, fatigue, and in septic shock. CT Head showed atrophy most prominent at the brainstem but no acute changes. MRI pending.   Assessment / Plan / Recommendation Clinical Impression  Pt is lethargic this morning, stirring minimally with tactile stimulation but without ever fully arousing despite Max multimodal cueing. SLP provided thermal/tactile input to lips with ice chips but pt showed no signs of awareness. His wife says that he did not have any baseline dysphagia. Recommend that he remain NPO at this time. Will continue to follow for PO readiness versus need for instrumental testing as alertness improves. SLP Visit Diagnosis: Dysphagia, unspecified (R13.10)    Aspiration  Risk  Severe aspiration risk    Diet Recommendation NPO   Medication Administration: Via alternative means    Other  Recommendations Oral Care Recommendations: Oral care QID Other Recommendations: Have oral suction available   Follow up Recommendations (tba)      Frequency and Duration min 2x/week  2 weeks       Prognosis Prognosis for Safe Diet Advancement: Good Barriers to Reach Goals: Cognitive deficits      Swallow Study   General HPI: Pt is a 67 y.o m with ESRD on HD, Essential HTN, HCV, Afib on coumadin, Diabetes mellitus, Bipolar disorder, hx of renal stones, HFrEF, prior CVA who presented with acute encephalopathy, dysarthria, right facial droop, fatigue, and in septic shock. CT Head showed atrophy most prominent at the brainstem but no acute changes. MRI pending. Type of Study: Bedside Swallow Evaluation Previous Swallow Assessment: BSE in 2015 recommending Dys 1 diet, thin liquids in the setting of AMS Diet Prior to this Study: NPO Temperature Spikes Noted: Yes(104.7) Respiratory Status: Room air History of Recent Intubation: No Behavior/Cognition: Lethargic/Drowsy;Doesn't follow directions Oral Cavity Assessment: Within Functional Limits Oral Care Completed by SLP: Yes Oral Cavity - Dentition: Missing dentition Vision: (does not open eyes) Self-Feeding Abilities: Total assist Patient Positioning: Upright in bed Baseline Vocal Quality: Not observed Volitional Cough: Cognitively unable to elicit Volitional Swallow: Unable to elicit    Oral/Motor/Sensory Function Overall Oral Motor/Sensory Function: Other (comment)(cognitively unable to assess)   Ice Chips Ice chips: Impaired Presentation: Spoon Oral Phase Impairments: Poor awareness of bolus;Other (comment)(no attempts at oral acceptance) Pharyngeal Phase Impairments: (N/A)   Thin Liquid Thin Liquid: Not tested    Nectar Thick Nectar Thick Liquid: Not tested   Honey Thick Honey Thick Liquid: Not tested    Puree     Solid   GO   Solid: Not tested        Oscar Castillo 11/22/2017,9:21 AM  Oscar Castillo, M.A. CCC-SLP [phone]

## 2017-11-22 NOTE — ED Provider Notes (Signed)
I received a call from lab that the patient's lactate was greater than 5.  When I walked in the room patient is resting comfortably though does appear confused.  On my assessment he is hypertensive I discussed the case with Dr. Stana Bunting with [facility].  He will reassess the patient Patient is currently on antibiotics   Ripley Fraise, MD 11/22/17 0126

## 2017-11-22 NOTE — Progress Notes (Signed)
Updated patient's wife regarding patient's progress. Answered questions.  Lars Mage, MD Internal Medicine PGY1 Pager:[phone] 11/22/2017, 1:37 PM

## 2017-11-22 NOTE — Progress Notes (Signed)
ANTICOAGULATION CONSULT NOTE - Initial Consult  Pharmacy Consult for Warfarin  Indication: atrial fibrillation  Allergies  Allergen Reactions  . Ceftriaxone Sodium In Dextrose Anaphylaxis  . Ciprofloxacin Other (See Comments)    UNSPECIFIED PAIN  . Nsaids Other (See Comments)    HISTORY OF ULCER  . Penicillins Swelling    SWELLING REACTION UNSPECIFIED  [ PATIENT WITH HX OF ANAPHYLAXIS TO CEFTRIAXONE ]  . Sulfa Antibiotics Swelling    SWELLING REACTION UNSPECIFIED    Patient Measurements: Weight: 200 lb 9.9 oz (91 kg)  Vital Signs: Temp: 102.3 F (39.1 C) (04/29 0335) Temp Source: Rectal (04/29 0335) BP: 111/74 (04/29 0345) Pulse Rate: 92 (04/29 0345)  Labs: Recent Labs    11/21/17 1832 11/21/17 1850  HGB 17.7* 14.7  HCT 52.0 45.8  PLT  --  173  APTT  --  40*  LABPROT  --  26.9*  INR  --  2.51  CREATININE 9.60* 9.58*    Estimated Creatinine Clearance: 8.6 mL/min (A) (by C-G formula based on SCr of 9.58 mg/dL (H)).   Medical History: Past Medical History:  Diagnosis Date  . Anemia   . Antral ulcer 2014   small  . BENIGN PROSTATIC HYPERTROPHY   . Bipolar disorder ([place])    "sometimes" (10/07/2016)  . CHOLELITHIASIS   . Chronic combined systolic and diastolic CHF (congestive heart failure) ([place])   . Complication of anesthesia    wife states pt had trouble waking up with in Nov., 2014  . CVA (cerebral vascular accident) ([place]) 07/2007   No residual effect  . DEPRESSION   . DIABETES MELLITUS, TYPE II    diet control.  No medication  since November 2015  . ERECTILE DYSFUNCTION   . ESRD on hemodialysis ([place])    ESRD due to DM/HTN. .  HD TTS at [facility] on [address].  [name] GERD   . GI bleed    due to gastritis, discharged 10/02/16/notes 10/07/2016  . Hepatitis C    C - has been treated  . History of Clostridium difficile   . History of kidney stones   . HYPERTENSION   . LBBB (left bundle branch block)   . Morbid obesity ([place])   . PAF (paroxysmal atrial  fibrillation) ([place])    a. Dx 12/2015.   Assessment: 67 y/o M with confusion, on warfarin PTA for afib, INR is therapeutic at 2.51 on admit, CBC is good, ESRD on HD.   Warfarin PTA dosing: 1.25 mg on Saturdays, 2.5 mg all other days  Goal of Therapy:  INR 2-3 Monitor platelets by anticoagulation protocol: Yes   Plan:  Warfarin per home regimen  Daily PT/INR Monitor for bleeding   Narda Bonds 11/22/2017,4:17 AM

## 2017-11-22 NOTE — H&P (Signed)
PULMONARY / CRITICAL CARE MEDICINE   Name: Oscar Castillo MRN: [id number] DOB: 03/30/1951    ADMISSION DATE:  11/21/2017 CONSULTATION DATE: 11/22/2017  REFERRING MD:  Dr. Stana Bunting  CHIEF COMPLAINT:  Right facial droop  HISTORY OF PRESENT ILLNESS:   Patient is a 67 y.o male with ESRD, A-fib on warfarin, HFrEF, prior CVA who was brought to the ED via EMS for right facial droop and AMS on 4/28 at 1745. Per the wife the patient was in his usual state of health when he developed rigor, right facial droop, and altered mental status. It was abrupt in onset and not precluded by anything. His last HD session was on 4/27; however, he did not complete his whole session. He is on midodrine BID for hypotension. Prior to arrival at the ED the patient was not complaining of fevers, N/V, cough, SOB, abdominal pain, myalgias, arthralgias.   Upon arrival to the ED, a code stroke was activated and neurology was consulted. It was felt his presentation could be consisted with seizure vs CVA vs infectious (sepsis). The patient was started on Meropenem. Labs were consistent with ESRD with no emergent indications for HD. No leukocytosis was noted. CXR and CT head were without acute abnormalities.   PAST MEDICAL HISTORY :  He  has a past medical history of Anemia, Antral ulcer (2014), BENIGN PROSTATIC HYPERTROPHY, Bipolar disorder ([place]), CHOLELITHIASIS, Chronic combined systolic and diastolic CHF (congestive heart failure) ([place]), Complication of anesthesia, CVA (cerebral vascular accident) ([place]) (07/2007), DEPRESSION, DIABETES MELLITUS, TYPE II, ERECTILE DYSFUNCTION, ESRD on hemodialysis ([place]), GERD, GI bleed, Hepatitis C, History of Clostridium difficile, History of kidney stones, HYPERTENSION, LBBB (left bundle branch block), Morbid obesity ([place]), and PAF (paroxysmal atrial fibrillation) ([place]).  PAST SURGICAL HISTORY: He  has a past surgical history that includes AV fistula placement (06/14/2012); Tibia IM nail insertion  (Left, 09/09/2012); ORIF fibula fracture (Left, 09/09/2012); Colonoscopy (N/A, 10/28/2012); Esophagogastroduodenoscopy (N/A, 11/02/2012); Colonoscopy (N/A, 11/02/2012); Colonoscopy (N/A, 11/03/2012); Givens capsule study (N/A, 11/04/2012); enteroscopy (N/A, 11/08/2012); Amputation (Left, 05/12/2013); Eye surgery (Left); Amputation (Left, 06/09/2013); Hardware Removal (Left, 06/09/2013); Amputation (Right, 09/08/2013); Amputation (Right, 10/11/2013); Colonoscopy (N/A, 09/16/2016); Esophagogastroduodenoscopy (egd) with propofol (N/A, 09/16/2016); ir generic historical (09/27/2016); ir generic historical (09/27/2016); Givens capsule study (N/A, 09/29/2016); Ligation of arteriovenous  fistula (Left, 11/09/2016); Insertion of dialysis catheter (N/A, 11/09/2016); Exchange of a dialysis catheter (Left, 11/13/2016); UPPER EXTREMITY VENOGRAPHY (N/A, 12/14/2016); Bascilic vein transposition (Left, 02/17/2017); Bascilic vein transposition (Left, 04/21/2017); TEMPORARY PACEMAKER (N/A, 04/29/2017); PACEMAKER IMPLANT (N/A, 04/30/2017); AV fistula placement (Left, 07/21/2017); and Hematoma evacuation (Left, 04/29/2017).  Allergies  Allergen Reactions  . Ceftriaxone Sodium In Dextrose Anaphylaxis  . Ciprofloxacin Other (See Comments)    UNSPECIFIED PAIN  . Nsaids Other (See Comments)    HISTORY OF ULCER  . Penicillins Swelling    SWELLING REACTION UNSPECIFIED  [ PATIENT WITH HX OF ANAPHYLAXIS TO CEFTRIAXONE ]  . Sulfa Antibiotics Swelling    SWELLING REACTION UNSPECIFIED    No current facility-administered medications on file prior to encounter.    Current Outpatient Medications on File Prior to Encounter  Medication Sig  . acetaminophen (TYLENOL) 500 MG tablet Take 500 mg by mouth every 6 (six) hours as needed for mild pain.  [name] amiodarone (PACERONE) 200 MG tablet Take 1 tablet (200 mg total) by mouth daily.  [name] atorvastatin (LIPITOR) 40 MG tablet TAKE 1 TABLET(40 MG) BY MOUTH DAILY AT 6 PM  . calcium acetate (PHOSLO) 667 MG capsule Take  1,334 mg by mouth 2 (  two) times daily.   [name] gabapentin (NEURONTIN) 300 MG capsule Take 1 capsule (300 mg total) by mouth 3 (three) times daily. 3 times a day when necessary neuropathy pain (Patient taking differently: Take 300 mg by mouth 2 (two) times daily. )  . latanoprost (XALATAN) 0.005 % ophthalmic solution Place 1 drop into both eyes at bedtime.   . midodrine (PROAMATINE) 5 MG tablet TAKE 1 TABLET(5 MG) BY MOUTH TWICE DAILY WITH A MEAL  . oxyCODONE-acetaminophen (PERCOCET/ROXICET) 5-325 MG tablet Take 1 tablet by mouth every 6 (six) hours as needed. (Patient taking differently: Take 1 tablet by mouth every 6 (six) hours as needed for moderate pain. )  . pantoprazole (PROTONIX) 40 MG tablet Take 1 tablet (40 mg total) by mouth 2 (two) times daily before a meal.  . pentoxifylline (TRENTAL) 400 MG CR tablet Take 1 tablet (400 mg total) by mouth 3 (three) times daily with meals.  [name] QUEtiapine (SEROQUEL) 25 MG tablet Take 0.5 tablets (12.5 mg total) by mouth at bedtime.  . senna (SENOKOT) 8.6 MG TABS tablet Take 1 tablet (8.6 mg total) by mouth daily. (Patient taking differently: Take 1 tablet by mouth daily as needed for mild constipation. )  . SENSIPAR 30 MG tablet Take 30 mg by mouth every evening.   . silver sulfADIAZINE (SSD) 1 % cream APPLY EXTERNALLY TO THE AFFECTED AREA DAILY (Patient taking differently: Apply 1 application topically daily as needed (skin care). )  . sorbitol 70 % SOLN Take 30 mLs by mouth 2 (two) times daily. (Patient taking differently: Take 30 mLs by mouth 2 (two) times daily as needed for moderate constipation. )  . traMADol (ULTRAM) 50 MG tablet Take 50 mg by mouth every 6 (six) hours as needed for moderate pain.  [name] warfarin (COUMADIN) 2.5 MG tablet Take as directed by Anticoagulation Clinic (Patient taking differently: Take 1.25-2.5 mg by mouth every evening. Take 2.5 mg daily except Saturday 1.25 mg.)   FAMILY HISTORY:  His indicated that his mother is deceased. He  indicated that his father is deceased. He indicated that all of his three sisters are alive. He indicated that his brother is alive. He indicated that his maternal grandmother is deceased. He indicated that his maternal grandfather is deceased. He indicated that his paternal grandmother is deceased. He indicated that his paternal grandfather is deceased. He indicated that both of his daughters are alive. He indicated that the status of his other is unknown.  SOCIAL HISTORY: He  reports that he has never smoked. He has never used smokeless tobacco. He reports that he does not drink alcohol or use drugs.  REVIEW OF SYSTEMS:   12 point ROS preformed. All negative aside from those mentioned in the HPI.  SUBJECTIVE:  Patient is encephalopathic and unresponsive.   VITAL SIGNS: BP 93/61   Pulse (!) 110   Temp (!) 104.7 F (40.4 C) (Rectal)   Resp 20   Wt 200 lb 9.9 oz (91 kg)   SpO2 96%   BMI 28.79 kg/m   HEMODYNAMICS:    VENTILATOR SETTINGS:    INTAKE / OUTPUT: No intake/output data recorded.  PHYSICAL EXAMINATION: General: Obese male, minimally responsive, diaphoretic  HENT: Normocephalic, atraumatic, moist mucus membranes Pulm: Good air movement with no wheezing or crackles  CV: RRR, no murmurs, no rubs  Abdomen: Active bowel sounds, soft, non-distended, tenderness to palpation of the RLQ, LLQ, and suprapubic region  Extremities: Bilateral below the knee amputations, no edema noted Skin: Warm and  dry, erythema of the left LE where the patient had a socking  Neuro: Somnolent and minimally responsive  LABS: BMET Recent Labs  Lab 11/21/17 1832 11/21/17 1850  NA 133* 135  K 5.6* 5.5*  CL 101 94*  CO2  --  20*  BUN 51* 46*  CREATININE 9.60* 9.58*  GLUCOSE 183* 189*   Electrolytes Recent Labs  Lab 11/21/17 1850  CALCIUM 10.6*   CBC Recent Labs  Lab 11/21/17 1832 11/21/17 1850  WBC  --  6.5  HGB 17.7* 14.7  HCT 52.0 45.8  PLT  --  173   Coag's Recent Labs   Lab 11/15/17 1144 11/21/17 1850  APTT  --  40*  INR 3.8 2.51   Sepsis Markers Recent Labs  Lab 11/21/17 2307 11/22/17 0107  LATICACIDVEN 2.16* 5.64*   ABG Recent Labs  Lab 11/22/17 0226  PHART 7.462*  PCO2ART 24.8*  PO2ART 45.0*   Liver Enzymes Recent Labs  Lab 11/21/17 1850  AST 35  ALT 16*  ALKPHOS 56  BILITOT 0.6  ALBUMIN 3.8   Cardiac Enzymes No results for input(s): TROPONINI, PROBNP in the last 168 hours.  Glucose Recent Labs  Lab 11/21/17 1826  GLUCAP 184*   Imaging Dg Chest Port 1 View  Result Date: 11/21/2017 CLINICAL DATA:  Fever and altered mentation. EXAM: PORTABLE CHEST 1 VIEW COMPARISON:  07/07/2017 FINDINGS: Dual lumen left IJ dialysis catheter is stable with tip in the right atrium. Right-sided pacemaker apparatus with right atrial and right ventricular leads redemonstrated. Clear lungs. Normal size cardiac and mediastinal contours. No acute nor suspicious osseous abnormality. IMPRESSION: No active cardiopulmonary disease. Electronically Signed   By: Ashley Royalty M.D.   On: 11/21/2017 21:06   Ct Head Code Stroke Wo Contrast  Result Date: 11/21/2017 CLINICAL DATA:  Code stroke. Right-sided facial droop. Not following commands. EXAM: CT HEAD WITHOUT CONTRAST TECHNIQUE: Contiguous axial images were obtained from the base of the skull through the vertex without intravenous contrast. COMPARISON:  07/07/2017 FINDINGS: Brain: No evidence of acute infarction, hemorrhage, hydrocephalus, extra-axial collection or mass lesion/mass effect. Atrophy, most notable at the brainstem. Extensive chronic small vessel ischemic change in the cerebral white matter. Vascular: Diffuse atherosclerotic calcification. No hyperdense vessel. Skull: No acute or aggressive finding Sinuses/Orbits: Bilateral cataract resection.  No acute finding Other: These results were communicated to Dr. Lorraine Lax at 6:35 pmon 4/28/2019by text page via the [facility] messaging system. ASPECTS [facility] Stroke  Program Early CT Score) - Ganglionic level infarction (caudate, lentiform nuclei, internal capsule, insula, M1-M3 cortex): 7 - Supraganglionic infarction (M4-M6 cortex): 3 Total score (0-10 with 10 being normal): 10 IMPRESSION: 1. No acute finding. 2. Atrophy most prominent at the brainstem. Extensive chronic small vessel ischemia. Electronically Signed   By: Monte Fantasia M.D.   On: 11/21/2017 18:39   CULTURES: 4/29: Blood cultures pending  4/29: Viral respiratory panel pending  4/29: Sputum cultures   ANTIBIOTICS: Meropenem 4/28 -> Vancomycin 4/28 ->  SIGNIFICANT EVENTS: Admitted with sepsis on 4/29 >>>  LINES/TUBES: Right peripheral IV  Left peripheral IV  Left tunneled IJ HD catheter   DISCUSSION: Discussed the patient's current presentation, treatment plan, and code status with the wife at bedside. All questions and concerns addressed.   ASSESSMENT / PLAN: Patient is a 67 y.o male with ESRD, A-fib on warfarin, HFrEF, prior CVA who was brought to the ED with sepsis of unknown source.   INFECTIOUS A:  Sepsis of unknown source   P:   Follow  blood cultures  Follow viral respiratory panel  Follow sputum culture/gram stain  Order and trend procalcitonin  Trend lactic acid  CT abdomen Continue vancomycin and meropenem   PULMONARY A: No Acute issues   P:   Monitor closely. Currently protecting his airway, oxygenating, and ventilating.   CARDIOVASCULAR A: HFrEF Atrial fibrillation  Hypotension   P:  Strict I&O's  Daily weights  Continue warfarin  Continue amiodarone  Maintain MAP >65. If needed will start Neo   RENAL A:  ESRD on HD TTS  P:   No emergent indications for HD  Consult Nephrology in the AM  GASTROINTESTINAL A: GI ppx  Nutrition   P:   NPO until mentation improves No indication for GI ppx at this point   HEMATOLOGIC A: No active issues  P:  Trend CBC   ENDOCRINE A:  Diabetes Mellitus   P:   SSI   NEUROLOGIC A:  Encephalopathy  (2/2 infection)  P:   Treat underlying cause as outlined above  Pulmonary and Critical Care Medicine [facility] Pager: [phone]  11/22/2017, 2:47 AM

## 2017-11-23 DIAGNOSIS — J810 Acute pulmonary edema: Secondary | ICD-10-CM

## 2017-11-23 DIAGNOSIS — J811 Chronic pulmonary edema: Secondary | ICD-10-CM

## 2017-11-23 LAB — BASIC METABOLIC PANEL
Anion gap: 16 — ABNORMAL HIGH (ref 5–15)
BUN: 59 mg/dL — ABNORMAL HIGH (ref 6–20)
CHLORIDE: 105 mmol/L (ref 101–111)
CO2: 19 mmol/L — ABNORMAL LOW (ref 22–32)
Calcium: 9.1 mg/dL (ref 8.9–10.3)
Creatinine, Ser: 11.32 mg/dL — ABNORMAL HIGH (ref 0.61–1.24)
GFR, EST AFRICAN AMERICAN: 5 mL/min — AB (ref >60)
GFR, EST NON AFRICAN AMERICAN: 4 mL/min — AB (ref >60)
Glucose, Bld: 96 mg/dL (ref 65–99)
POTASSIUM: 4.8 mmol/L (ref 3.5–5.1)
SODIUM: 140 mmol/L (ref 135–145)

## 2017-11-23 LAB — CBC
HEMATOCRIT: 35.9 % — AB (ref 39.0–52.0)
Hemoglobin: 11.6 g/dL — ABNORMAL LOW (ref 13.0–17.0)
MCH: 29.6 pg (ref 26.0–34.0)
MCHC: 32.3 g/dL (ref 30.0–36.0)
MCV: 91.6 fL (ref 78.0–100.0)
PLATELETS: 162 10*3/uL (ref 150–400)
RBC: 3.92 MIL/uL — AB (ref 4.22–5.81)
RDW: 17.5 % — ABNORMAL HIGH (ref 11.5–15.5)
WBC: 7.9 10*3/uL (ref 4.0–10.5)

## 2017-11-23 LAB — GLUCOSE, CAPILLARY
GLUCOSE-CAPILLARY: 101 mg/dL — AB (ref 65–99)
GLUCOSE-CAPILLARY: 87 mg/dL (ref 65–99)
Glucose-Capillary: 84 mg/dL (ref 65–99)
Glucose-Capillary: 90 mg/dL (ref 65–99)
Glucose-Capillary: 87 mg/dL (ref 65–99)
Glucose-Capillary: 79 mg/dL (ref 65–99)
Glucose-Capillary: 99 mg/dL (ref 65–99)

## 2017-11-23 LAB — PROCALCITONIN: Procalcitonin: 68.84 ng/mL

## 2017-11-23 LAB — MAGNESIUM: Magnesium: 2 mg/dL (ref 1.7–2.4)

## 2017-11-23 LAB — PHOSPHORUS: PHOSPHORUS: 4.2 mg/dL (ref 2.5–4.6)

## 2017-11-23 LAB — PROTIME-INR
INR: 2.64
Prothrombin Time: 27.9 seconds — ABNORMAL HIGH (ref 11.4–15.2)

## 2017-11-23 MED ORDER — HEPARIN SODIUM (PORCINE) 1000 UNIT/ML DIALYSIS
3000.0000 [IU] | INTRAMUSCULAR | Status: DC | PRN
  Administered 2017-11-23: 3000 [IU] via INTRAVENOUS_CENTRAL

## 2017-11-23 MED ORDER — LIDOCAINE-PRILOCAINE 2.5-2.5 % EX CREA
1.0000 "application " | TOPICAL_CREAM | CUTANEOUS | Status: DC | PRN
  Filled 2017-11-23: qty 5

## 2017-11-23 MED ORDER — PENTAFLUOROPROP-TETRAFLUOROETH EX AERO: 1.0000 "application " | INHALATION_SPRAY | CUTANEOUS | Status: DC | PRN

## 2017-11-23 MED ORDER — ATORVASTATIN CALCIUM 40 MG PO TABS
40.0000 mg | ORAL_TABLET | Freq: Every day | ORAL | Status: DC
  Administered 2017-11-24: 40 mg via ORAL
  Filled 2017-11-23 (×3): qty 1

## 2017-11-23 MED ORDER — VITAMIN K1 10 MG/ML IJ SOLN
5.0000 mg | Freq: Once | INTRAMUSCULAR | Status: AC
  Administered 2017-11-23: 5 mg via INTRAVENOUS
  Filled 2017-11-23: qty 0.5

## 2017-11-23 MED ORDER — VANCOMYCIN HCL IN DEXTROSE 1-5 GM/200ML-% IV SOLN
INTRAVENOUS | Status: AC
  Filled 2017-11-23: qty 200

## 2017-11-23 MED ORDER — SODIUM CHLORIDE 0.9 % IV SOLN: 100.0000 mL | INTRAVENOUS | Status: DC | PRN

## 2017-11-23 MED ORDER — LATANOPROST 0.005 % OP SOLN
1.0000 [drp] | Freq: Every day | OPHTHALMIC | Status: DC
  Administered 2017-11-24 – 2017-12-01 (×8): 1 [drp] via OPHTHALMIC
  Filled 2017-11-23 (×2): qty 2.5

## 2017-11-23 MED ORDER — MIDODRINE HCL 5 MG PO TABS
5.0000 mg | ORAL_TABLET | Freq: Two times a day (BID) | ORAL | Status: DC
  Filled 2017-11-23: qty 1

## 2017-11-23 MED ORDER — MIDODRINE HCL 5 MG PO TABS
5.0000 mg | ORAL_TABLET | Freq: Two times a day (BID) | ORAL | Status: DC
  Administered 2017-11-23 – 2017-11-24 (×3): 5 mg via ORAL
  Filled 2017-11-23 (×6): qty 1

## 2017-11-23 MED ORDER — QUETIAPINE FUMARATE 25 MG PO TABS
12.5000 mg | ORAL_TABLET | Freq: Every day | ORAL | Status: DC
  Administered 2017-11-24: 12.5 mg via ORAL
  Filled 2017-11-23 (×3): qty 1

## 2017-11-23 NOTE — Progress Notes (Signed)
  Speech Language Pathology Treatment: Dysphagia  Patient Details Name: Oscar Castillo MRN: [id number] DOB: 1951-01-19 Today's Date: 11/23/2017 Time: 9937-1696 SLP Time Calculation (min) (ACUTE ONLY): 11 min  Assessment / Plan / Recommendation Clinical Impression  Pt is mildly more alert today, vocalizing and verbalizing in small amounts with vocal quality sounding clear. He opened his eyes only a few times despite Max cues. Pt continues to be impacted by mentation and awareness, unable to obtain liquid from a cup or straw despite Max cues. However, given Min tactile input from a spoon, he shows good acceptance and seemingly swift swallow response with ice chips and purees. No overt signs of aspiration are noted and his oral cavity appears clear, as can be visualized as pt does not open his mouth very wide to command. Although pt is still not yet ready for a diet, this shows good potential for diet advancement when he is more alert. For now, would stay NPO except for ice chips when alert. Meds could be attempted crushed in puree, but again only when alert, so unclear how consistently they could be administered this way. SLP will continue to follow.   HPI HPI: Pt is a 67 y.o m with ESRD on HD, Essential HTN, HCV, Afib on coumadin, Diabetes mellitus, Bipolar disorder, hx of renal stones, HFrEF, prior CVA who presented with acute encephalopathy, dysarthria, right facial droop, fatigue, and in septic shock. CT Head showed atrophy most prominent at the brainstem but no acute changes. MRI pending.      SLP Plan  Continue with current plan of care       Recommendations  Diet recommendations: NPO;Other(comment)(ice chips if alert) Medication Administration: Crushed with puree                Oral Care Recommendations: Oral care QID Follow up Recommendations: (tba) SLP Visit Diagnosis: Dysphagia, unspecified (R13.10) Plan: Continue with current plan of care       GO                 Germain Osgood 11/23/2017, 9:38 AM  Germain Osgood, M.A. CCC-SLP [phone]

## 2017-11-23 NOTE — Progress Notes (Addendum)
PULMONARY / CRITICAL CARE MEDICINE   Name: Oscar Castillo MRN: [id number] DOB: [birth year]    ADMISSION DATE:  11/21/2017 CONSULTATION DATE:  11/22/17  REFERRING MD:  Dr. Stana Bunting  CHIEF COMPLAINT:  Acute encephalopathy  Brief patient summary: Oscar Castillo is a 67 y.o m with ESRD on HD TTS, Essential HTN, HCV, Afib on coumadin, complete heart block s/p ppm, bilateral BKA, Diabetes mellitus, Bipolar disorder, hx of renal stones, HFrEF takes midodrine at home, prior CVA who presented with acute encephalopathy, dysarthria, right facial droop, fatigue, and in septic shock. Patient had received only 3hrs af his last dialysis session rather than the usual 4hrs he usually gets.   Presented febrile (104), tachycardic (110s), tachypnic (20s), hypotensive. Labs remarkable for a lactic acid of 2.16, negative troponin, elevated potassium at 5.6, creatinine at 9 (baseline 5-6), pt=26.9, aptt=40, normal liver enzymes and bilirubin. CT head did not show any acute abnormalities, atrophy in the brainstem, and extensive chronic small vessel disease. Chest xray shows no acute cardiopulmonary disease.   Patient thought to be in septic shock got 3L of IVF in ED, blood and sputum cultures collected, dialysis catheter was removed due to possible CLABSI, and MRI and EEG ordered. Patient was started on meropenem and vancomycin.   PAST MEDICAL HISTORY :  He  has a past medical history of Anemia, Antral ulcer (2014), BENIGN PROSTATIC HYPERTROPHY, Bipolar disorder ([place]), CHOLELITHIASIS, Chronic combined systolic and diastolic CHF (congestive heart failure) ([place]), Complication of anesthesia, CVA (cerebral vascular accident) ([place]) (07/2007), DEPRESSION, DIABETES MELLITUS, TYPE II, ERECTILE DYSFUNCTION, ESRD on hemodialysis ([place]), GERD, GI bleed, Hepatitis C, History of Clostridium difficile, History of kidney stones, HYPERTENSION, LBBB (left bundle branch block), Morbid obesity ([place]), and PAF (paroxysmal atrial fibrillation)  ([place]).  PAST SURGICAL HISTORY: He  has a past surgical history that includes AV fistula placement (06/14/2012); Tibia IM nail insertion (Left, 09/09/2012); ORIF fibula fracture (Left, 09/09/2012); Colonoscopy (N/A, 10/28/2012); Esophagogastroduodenoscopy (N/A, 11/02/2012); Colonoscopy (N/A, 11/02/2012); Colonoscopy (N/A, 11/03/2012); Givens capsule study (N/A, 11/04/2012); enteroscopy (N/A, 11/08/2012); Amputation (Left, 05/12/2013); Eye surgery (Left); Amputation (Left, 06/09/2013); Hardware Removal (Left, 06/09/2013); Amputation (Right, 09/08/2013); Amputation (Right, 10/11/2013); Colonoscopy (N/A, 09/16/2016); Esophagogastroduodenoscopy (egd) with propofol (N/A, 09/16/2016); ir generic historical (09/27/2016); ir generic historical (09/27/2016); Givens capsule study (N/A, 09/29/2016); Ligation of arteriovenous  fistula (Left, 11/09/2016); Insertion of dialysis catheter (N/A, 11/09/2016); Exchange of a dialysis catheter (Left, 11/13/2016); UPPER EXTREMITY VENOGRAPHY (N/A, 12/14/2016); Bascilic vein transposition (Left, 02/17/2017); Bascilic vein transposition (Left, 04/21/2017); TEMPORARY PACEMAKER (N/A, 04/29/2017); PACEMAKER IMPLANT (N/A, 04/30/2017); AV fistula placement (Left, 07/21/2017); and Hematoma evacuation (Left, 04/29/2017).  Allergies  Allergen Reactions  . Ceftriaxone Sodium In Dextrose Anaphylaxis  . Ciprofloxacin Other (See Comments)    UNSPECIFIED PAIN  . Nsaids Other (See Comments)    HISTORY OF ULCER  . Penicillins Swelling    SWELLING REACTION UNSPECIFIED  [ PATIENT WITH HX OF ANAPHYLAXIS TO CEFTRIAXONE ]  . Sulfa Antibiotics Swelling    SWELLING REACTION UNSPECIFIED     No current facility-administered medications on file prior to encounter.    Current Outpatient Medications on File Prior to Encounter  Medication Sig  . acetaminophen (TYLENOL) 500 MG tablet Take 500 mg by mouth every 6 (six) hours as needed for mild pain.  [name] amiodarone (PACERONE) 200 MG tablet Take 1 tablet (200 mg total) by mouth  daily.  [name] atorvastatin (LIPITOR) 40 MG tablet TAKE 1 TABLET(40 MG) BY MOUTH DAILY AT 6 PM  . calcium acetate (PHOSLO) 667 MG capsule Take 1,334  mg by mouth 2 (two) times daily.   [name] gabapentin (NEURONTIN) 300 MG capsule Take 1 capsule (300 mg total) by mouth 3 (three) times daily. 3 times a day when necessary neuropathy pain (Patient taking differently: Take 300 mg by mouth 2 (two) times daily. )  . latanoprost (XALATAN) 0.005 % ophthalmic solution Place 1 drop into both eyes at bedtime.   . midodrine (PROAMATINE) 5 MG tablet TAKE 1 TABLET(5 MG) BY MOUTH TWICE DAILY WITH A MEAL  . oxyCODONE-acetaminophen (PERCOCET/ROXICET) 5-325 MG tablet Take 1 tablet by mouth every 6 (six) hours as needed. (Patient taking differently: Take 1 tablet by mouth every 6 (six) hours as needed for moderate pain. )  . pantoprazole (PROTONIX) 40 MG tablet Take 1 tablet (40 mg total) by mouth 2 (two) times daily before a meal.  . pentoxifylline (TRENTAL) 400 MG CR tablet Take 1 tablet (400 mg total) by mouth 3 (three) times daily with meals.  [name] QUEtiapine (SEROQUEL) 25 MG tablet Take 0.5 tablets (12.5 mg total) by mouth at bedtime.  . senna (SENOKOT) 8.6 MG TABS tablet Take 1 tablet (8.6 mg total) by mouth daily. (Patient taking differently: Take 1 tablet by mouth daily as needed for mild constipation. )  . SENSIPAR 30 MG tablet Take 30 mg by mouth every evening.   . silver sulfADIAZINE (SSD) 1 % cream APPLY EXTERNALLY TO THE AFFECTED AREA DAILY (Patient taking differently: Apply 1 application topically daily as needed (skin care). )  . sorbitol 70 % SOLN Take 30 mLs by mouth 2 (two) times daily. (Patient taking differently: Take 30 mLs by mouth 2 (two) times daily as needed for moderate constipation. )  . traMADol (ULTRAM) 50 MG tablet Take 50 mg by mouth every 6 (six) hours as needed for moderate pain.  [name] warfarin (COUMADIN) 2.5 MG tablet Take as directed by Anticoagulation Clinic (Patient taking differently: Take 1.25-2.5  mg by mouth every evening. Take 2.5 mg daily except Saturday 1.25 mg.)  . midodrine (PROAMATINE) 5 MG tablet TAKE 1 TABLET(5 MG) BY MOUTH TWICE DAILY WITH A MEAL    FAMILY HISTORY:  His indicated that his mother is deceased. He indicated that his father is deceased. He indicated that all of his three sisters are alive. He indicated that his brother is alive. He indicated that his maternal grandmother is deceased. He indicated that his maternal grandfather is deceased. He indicated that his paternal grandmother is deceased. He indicated that his paternal grandfather is deceased. He indicated that both of his daughters are alive. He indicated that the status of his other is unknown.   SOCIAL HISTORY: He  reports that he has never smoked. He has never used smokeless tobacco. He reports that he does not drink alcohol or use drugs.  SUBJECTIVE:  Patient was minimally interactive. He was awake and able to state his name. Not able to mention year or city he was in.  VITAL SIGNS: BP 118/80   Pulse 75   Temp 97.6 F (36.4 C) (Oral)   Resp 15   Wt 204 lb 9.4 oz (92.8 kg)   SpO2 99%   BMI 29.36 kg/m   INTAKE / OUTPUT: I/O last 3 completed shifts: In: 4724.5 [I.V.:665.3; IV Piggyback:4059.2] Out: 0   PHYSICAL EXAMINATION: Physical Exam  Constitutional: He appears well-developed and well-nourished. No distress.  HENT:  Head: Normocephalic and atraumatic.  Cardiovascular: Normal rate, regular rhythm and normal heart sounds.  Respiratory: Effort normal and breath sounds normal. No respiratory distress.  He has no wheezes.  GI: Soft. Bowel sounds are normal. He exhibits no distension. There is no tenderness.  Musculoskeletal: He exhibits no edema.  Neurological:  Patient oriented to person   Skin: He is not diaphoretic. There is erythema (mild amount of erythema present on distal aspect of left lower extremity above site of amputation).   LABS:  BMET Recent Labs  Lab 11/21/17 1850  11/22/17 0612 11/23/17 0400  NA 135 137 140  K 5.5* 4.6 4.8  CL 94* 105 105  CO2 20* 22 19*  BUN 46* 51* 59*  CREATININE 9.58* 10.40* 11.32*  GLUCOSE 189* 136* 96    Electrolytes Recent Labs  Lab 11/21/17 1850 11/22/17 0612 11/23/17 0400  CALCIUM 10.6* 9.2 9.1  MG  --  1.9 2.0  PHOS  --  2.9 4.2    CBC Recent Labs  Lab 11/21/17 1850 11/22/17 0612 11/23/17 0400  WBC 6.5 11.2* 7.9  HGB 14.7 12.2* 11.6*  HCT 45.8 38.2* 35.9*  PLT 173 167 162    Coag's Recent Labs  Lab 11/21/17 1850 11/22/17 0612 11/23/17 0400  APTT 40*  --   --   INR 2.51 2.80 2.64    Sepsis Markers Recent Labs  Lab 11/22/17 0612 11/22/17 0852 11/22/17 1143 11/23/17 0400  LATICACIDVEN 1.5 1.0 1.0  --   PROCALCITON 40.31  --   --  68.84    ABG Recent Labs  Lab 11/22/17 0226 11/22/17 0535  PHART 7.462* 7.375  PCO2ART 24.8* 33.2  PO2ART 45.0* 72.0*    Liver Enzymes Recent Labs  Lab 11/21/17 1850 11/22/17 0612  AST 35 36  ALT 16* 13*  ALKPHOS 56 41  BILITOT 0.6 0.7  ALBUMIN 3.8 3.0*    Cardiac Enzymes No results for input(s): TROPONINI, PROBNP in the last 168 hours.  Glucose Recent Labs  Lab 11/22/17 0742 11/22/17 1145 11/22/17 1602 11/22/17 2015 11/23/17 0016 11/23/17 0415  GLUCAP 116* 115* 117* 102* 101* 84    Imaging Dg Chest 2 View  Result Date: 11/22/2017 CLINICAL DATA:  Pulmonary edema No chest pain or SOB EXAM: CHEST - 2 VIEW COMPARISON:  the previous day's study FINDINGS: Lungs are clear. Stable tunneled left IJ hemodialysis catheter to the right atrium. Stable dual lead right subclavian pacemaker. Heart size and mediastinal contours are within normal limits. Tortuous thoracic aorta. No effusion. Visualized bones unremarkable. IMPRESSION: No acute cardiopulmonary disease. Electronically Signed   By: Lucrezia Europe M.D.   On: 11/22/2017 20:51   Mr Brain Wo Contrast  Result Date: 11/22/2017 CLINICAL DATA:  67 year old male with confusion. Code stroke  presentation yesterday with right side facial droop. EXAM: MRI HEAD WITHOUT CONTRAST TECHNIQUE: Multiplanar, multiecho pulse sequences of the brain and surrounding structures were obtained without intravenous contrast. COMPARISON:  Head CT without contrast 11/21/2017 and earlier. Brain MRI 12/27/2015. FINDINGS: Brain: No restricted diffusion to suggest acute infarction. No midline shift, mass effect, evidence of mass lesion, ventriculomegaly, extra-axial collection or acute intracranial hemorrhage. Cervicomedullary junction and pituitary are within normal limits. Stable cerebral volume since 2017 with disproportionate atrophy of the brainstem. There is stable T2 heterogeneity throughout the mid and dorsal pons compatible with chronic lacunar infarcts. Cerebellar volume appears better preserved. Advanced chronic lacunar infarcts in the bilateral thalami some with associated chronic microhemorrhage. The basal ganglia are relatively spared. There is chronic Patchy and confluent supratentorial cerebral white matter T2 and FLAIR hyperintensity. No new signal abnormality compared to the 2017 MRI. Vascular: Major intracranial vascular flow voids  are stable. Skull and upper cervical spine: Stable and negative. Normal bone marrow signal. Sinuses/Orbits: Interval postoperative changes to the right globe. Otherwise stable and negative. Other: A mild-to-moderate left mastoid effusion is new since 2018. Negative nasopharynx. The right mastoids remain clear. Grossly normal other visible internal auditory structures. Scalp and face soft tissues appear negative. IMPRESSION: 1.  No acute intracranial abnormality. 2. Chronic brainstem atrophy with evidence of advanced chronic small vessel disease in the thalami and pons appears stable since a 2017 MRI. 3. Left mastoid effusion is new since 2018 and probably postinflammatory. Electronically Signed   By: Genevie Ann M.D.   On: 11/22/2017 11:28   CULTURES: Blood cultures( 4/28)  pending Blood culture (4/29) pending RVP (4/29) pending Sputum cultures (4/29) pending  Line culture (4/29) cancelled  ANTIBIOTICS: Meropenem (4/28)> Vancomycin (4/28)>  SIGNIFICANT EVENTS:  LINES/TUBES: Right peripheral IV Left peripheral IV Left tunneled IJ HD cath  ASSESSMENT / PLAN:   PULMONARY A: None  P:    CARDIOVASCULAR A:  Hypotension H/o HFrEF H/o Atrial fibrillation Increased QT=623  P:  Cardiac monitoring Daily weights I/O Held warfarin to possibly do LP if ams doesn't improve Held amiodarone as patient failed slp Neo gtt  RENAL A:   ESRD on HD TTS with last session being a partial session Metabolic alkalosis Hyperkalemia-resolved  P:   Nephrology plan to do HD today  GASTROINTESTINAL A:   GI prophylaxis due to coagulopathy NPO  P:   Diet NPO IV Famotidine  HEMATOLOGIC A:   None  P:  Monitor CBC: wbc has decreased from 11.2 to 7.9  INFECTIOUS A:   Sepsis of unknown source Possible meningoencephalitis  P:   Afebrile this morning Procalcitonin 40>68 Blood culture (4/28) no growth for 2 days Blood culture (4/29) no growth for 1 day UA pending  Continue vancomycin (4/28) Continue meropenem (4/28) thought to be due to anaphylactic allergy to pcn and cephalosporins also pt has elevated QT Acyclovir (4/29)> Lactic acid 2.16>5.64  ENDOCRINE A:   Diabetes Mellitus  P:   Cortisol=16.6 SSI  NEUROLOGIC A:   CVA vs seizure  Acute encephalopathy in setting of septic shock   P:   UDS pending  Keppra 500mg  BID, seizure precautions Appreciate neurology following- recommended LP as there is no clear source. INR today 2.64 (4/30). INR needs to be 1.5 for LP. MRI did not show any acute intracranial abnormalities EEG showed diffuse slowing Ammonia=29  FAMILY  Updated wife who was at bedside today 4/30.  Pulmonary and [place] Pager: [phone]  11/23/2017, 7:13  AM

## 2017-11-23 NOTE — Progress Notes (Signed)
[facility] Progress Note  Subjective: more responsive today, fever curve sharply dropped blood cx;s and resp panel pcr are negative  Vitals:   11/23/17 0730 11/23/17 0800 11/23/17 0830 11/23/17 0900  BP: 119/83 (!) 151/93 (!) 149/94   Pulse: 71 72 74 75  Resp:      Temp:  97.7 F (36.5 C)    TempSrc:  Oral    SpO2: 100% 99% 100% 99%  Weight:        Inpatient medications: . amiodarone  200 mg Oral Daily  . insulin aspart  2-6 Units Subcutaneous Q4H  . mouth rinse  15 mL Mouth Rinse BID  . midodrine  5 mg Oral BID WC  . sodium chloride flush  3 mL Intravenous Q12H   . sodium chloride    . acyclovir    . famotidine (PEPCID) IV 20 mg (11/23/17 1040)  . meropenem (MERREM) IV Stopped (11/22/17 2315)  . phytonadione (VITAMIN K) IV    . vancomycin     sodium chloride, acetaminophen **OR** acetaminophen, albuterol, ondansetron (ZOFRAN) IV  Exam: Chron ill arousable and follows simple commands No jvd Chest cta bilat Cor reg no gallop abd obese soft ntnd bilat bka no edema Left chest tdc no drainage  [facility] 4[facility]  91.5kg  2/2.5 bath p4  Hep 4000+ 2000 midrun  Left chest tdc Venofer 50mg  IV q week       Impression: 1 fevers septic shock - bp's up off pressors  no cause yet  cxr neg 2 esrd no hd tts 3 ams a little better 4 hypotension chronic on midodrine 5 anemia ckd hb up no esa need 6 afib on coumadin amio 7 dm2 per primary 8 mbd ckd no chg phoslo  Plan - hd today upstairs min uf   Kelly Splinter MD [facility] pager [phone]   11/23/2017, 11:16 AM   Recent Labs  Lab 11/21/17 1850 11/22/17 0612 11/23/17 0400  NA 135 137 140  K 5.5* 4.6 4.8  CL 94* 105 105  CO2 20* 22 19*  GLUCOSE 189* 136* 96  BUN 46* 51* 59*  CREATININE 9.58* 10.40* 11.32*  CALCIUM 10.6* 9.2 9.1  PHOS  --  2.9 4.2   Recent Labs  Lab 11/21/17 1850 11/22/17 0612  AST 35 36  ALT 16* 13*  ALKPHOS 56 41  BILITOT 0.6 0.7  PROT 8.7* 6.9   ALBUMIN 3.8 3.0*   Recent Labs  Lab 11/21/17 1850 11/22/17 0612 11/23/17 0400  WBC 6.5 11.2* 7.9  NEUTROABS 5.3  --   --   HGB 14.7 12.2* 11.6*  HCT 45.8 38.2* 35.9*  MCV 93.5 93.6 91.6  PLT 173 167 162   Iron/TIBC/Ferritin/ %Sat    Component Value Date/Time   IRON 50 10/01/2016 0419   TIBC 167 (L) 10/01/2016 0419   FERRITIN 351 (H) 10/01/2016 0419   IRONPCTSAT 30 10/01/2016 0419

## 2017-11-23 NOTE — Progress Notes (Signed)
Subjective: Much improved, denies headache  Exam: Vitals:   11/23/17 0830 11/23/17 0900  BP: (!) 149/94   Pulse: 74 75  Resp:    Temp:    SpO2: 100% 99%   Gen: In bed, NAD Resp: non-labored breathing, no acute distress Abd: soft, nt Neck: he resists all passive movement, not just neck so unclear if stiff.   Neuro: MS: Answer questions readily and follows commands, oriented to name, but gives location as [place](though he did know hospital).  CN: pupils are slightly asymmetric, he does not count in any visual field reliably.   Motor: he moves all extremities to command Sensory: he responds to nox stim in all extremitie.  Procalcitonin 60 EEG encephalopathic MRI with atrophy  Impression: 67 yo M with dense encephalopathy in the setting of high fevers. Without definite source, I would favor empiric CNS coverage pending lumbar puncture.  Though his improvement does make CNS infection slightly less likely, I still think that lumbar puncture is indicated.  Recommendations: 1) LP, will need INR reversed prior 2) empiric CNS coverage pending LP 3) will continue to follow.    Roland Rack, MD [facility] [phone]  If 7pm- 7am, please page neurology on call as listed in [place].

## 2017-11-24 DIAGNOSIS — I132 Hypertensive heart and chronic kidney disease with heart failure and with stage 5 chronic kidney disease, or end stage renal disease: Secondary | ICD-10-CM

## 2017-11-24 DIAGNOSIS — Z7901 Long term (current) use of anticoagulants: Secondary | ICD-10-CM

## 2017-11-24 DIAGNOSIS — I442 Atrioventricular block, complete: Secondary | ICD-10-CM

## 2017-11-24 DIAGNOSIS — Z886 Allergy status to analgesic agent status: Secondary | ICD-10-CM

## 2017-11-24 DIAGNOSIS — Z833 Family history of diabetes mellitus: Secondary | ICD-10-CM

## 2017-11-24 DIAGNOSIS — I5020 Unspecified systolic (congestive) heart failure: Secondary | ICD-10-CM

## 2017-11-24 DIAGNOSIS — Z882 Allergy status to sulfonamides status: Secondary | ICD-10-CM

## 2017-11-24 DIAGNOSIS — B1920 Unspecified viral hepatitis C without hepatic coma: Secondary | ICD-10-CM

## 2017-11-24 DIAGNOSIS — Z992 Dependence on renal dialysis: Secondary | ICD-10-CM

## 2017-11-24 DIAGNOSIS — Z881 Allergy status to other antibiotic agents status: Secondary | ICD-10-CM

## 2017-11-24 DIAGNOSIS — Z950 Presence of cardiac pacemaker: Secondary | ICD-10-CM

## 2017-11-24 DIAGNOSIS — J811 Chronic pulmonary edema: Secondary | ICD-10-CM

## 2017-11-24 DIAGNOSIS — Z880 Allergy status to penicillin: Secondary | ICD-10-CM

## 2017-11-24 DIAGNOSIS — Z8249 Family history of ischemic heart disease and other diseases of the circulatory system: Secondary | ICD-10-CM

## 2017-11-24 DIAGNOSIS — Z89511 Acquired absence of right leg below knee: Secondary | ICD-10-CM

## 2017-11-24 DIAGNOSIS — Z8673 Personal history of transient ischemic attack (TIA), and cerebral infarction without residual deficits: Secondary | ICD-10-CM

## 2017-11-24 DIAGNOSIS — R400 Somnolence: Secondary | ICD-10-CM

## 2017-11-24 DIAGNOSIS — I4891 Unspecified atrial fibrillation: Secondary | ICD-10-CM

## 2017-11-24 DIAGNOSIS — N186 End stage renal disease: Secondary | ICD-10-CM

## 2017-11-24 DIAGNOSIS — E1122 Type 2 diabetes mellitus with diabetic chronic kidney disease: Secondary | ICD-10-CM

## 2017-11-24 DIAGNOSIS — Z89512 Acquired absence of left leg below knee: Secondary | ICD-10-CM

## 2017-11-24 DIAGNOSIS — F319 Bipolar disorder, unspecified: Secondary | ICD-10-CM

## 2017-11-24 LAB — BASIC METABOLIC PANEL
Anion gap: 16 — ABNORMAL HIGH (ref 5–15)
BUN: 29 mg/dL — AB (ref 6–20)
CHLORIDE: 96 mmol/L — AB (ref 101–111)
CO2: 24 mmol/L (ref 22–32)
CREATININE: 7.27 mg/dL — AB (ref 0.61–1.24)
Calcium: 8.7 mg/dL — ABNORMAL LOW (ref 8.9–10.3)
GFR calc Af Amer: 8 mL/min — ABNORMAL LOW (ref >60)
GFR calc non Af Amer: 7 mL/min — ABNORMAL LOW (ref >60)
Glucose, Bld: 78 mg/dL (ref 65–99)
Potassium: 3.9 mmol/L (ref 3.5–5.1)
Sodium: 136 mmol/L (ref 135–145)

## 2017-11-24 LAB — PHOSPHORUS: Phosphorus: 3.2 mg/dL (ref 2.5–4.6)

## 2017-11-24 LAB — GLUCOSE, CAPILLARY
GLUCOSE-CAPILLARY: 83 mg/dL (ref 65–99)
GLUCOSE-CAPILLARY: 88 mg/dL (ref 65–99)
Glucose-Capillary: 74 mg/dL (ref 65–99)
Glucose-Capillary: 84 mg/dL (ref 65–99)
Glucose-Capillary: 87 mg/dL (ref 65–99)
Glucose-Capillary: 87 mg/dL (ref 65–99)

## 2017-11-24 LAB — CBC
HCT: 34.2 % — ABNORMAL LOW (ref 39.0–52.0)
Hemoglobin: 11.2 g/dL — ABNORMAL LOW (ref 13.0–17.0)
MCH: 29.5 pg (ref 26.0–34.0)
MCHC: 32.7 g/dL (ref 30.0–36.0)
MCV: 90 fL (ref 78.0–100.0)
PLATELETS: 171 10*3/uL (ref 150–400)
RBC: 3.8 MIL/uL — ABNORMAL LOW (ref 4.22–5.81)
RDW: 17.1 % — AB (ref 11.5–15.5)
WBC: 5.8 10*3/uL (ref 4.0–10.5)

## 2017-11-24 LAB — PROTIME-INR
INR: 1.59
Prothrombin Time: 18.8 seconds — ABNORMAL HIGH (ref 11.4–15.2)

## 2017-11-24 LAB — PROCALCITONIN: PROCALCITONIN: 70.93 ng/mL

## 2017-11-24 LAB — MAGNESIUM: Magnesium: 1.9 mg/dL (ref 1.7–2.4)

## 2017-11-24 NOTE — Progress Notes (Signed)
PROGRESS NOTE    Patient: Oscar Castillo     PCP: [name], MD                    DOB: June 02, 1951            DOA: 11/21/2017 TDD:[id number]             DOS: 11/24/2017, 3:42 PM   LOS: 3 days   Date of Service: The patient was seen and examined on 11/24/2017  Subjective:   Patient was seen and examined this morning, Oscar Castillo, was able to arouse follow some commands. Edematous upper extremity noted. Wife present at bedside Patient's wife and nursing staff his mental status has been waxing and weaning. Otherwise vitals are stable, no issues overnight.  ----------------------------------------------------------------------------------------------------------------------  Brief Narrative:  67 y.o m with ESRD on HD TTS, Essential HTN, HCV, Afib on coumadin, complete heart block s/p ppm, bilateral BKA, Diabetes mellitus, Bipolar disorder, hx of renal stones, HFrEFtakes midodrine at home, prior CVA who presented with acute encephalopathy, dysarthria, right facial droop, fatigue, and in septic shock. Patient had received only 3hrs af his last dialysis session rather than the usual 4hrs he usually gets.   Active Problems:   Severe sepsis (HCC)   Septic shock (HCC)   Acute encephalopathy   Pulmonary edema   Assessment & Plan:   Severe sepsis/septic shock -Source unknown, -Blood culture from 428 and urine culture has been obtained, has been negative to date -follow-up accordingly  -Status post fluid resuscitation new drip taper -Vitals improved, -Improved WBC from 11.2--->7.9, lactic acid -She has been initiated on vancomycin, acyclovir, meropenem -ID consulted for evaluation recommendation  Acute toxic metabolic encephalopathy -Possibly secondary to infection, neurology following, -Anticipating further evaluation by neurology  Hypotension secondary to above -Blood pressure stabilized improved after fluid resuscitation -Holding blood pressure medications -Status post treatment  with neo-epinephrine, subsequently tapered down -Pressure currently stable  Pulmonary edema -Stable on O2 via nasal cannula -   History of atrial fibrillation/congestive heart failure -Continue cardiac monitoring -Daily weights -Monitoring I's and O's -On hemodialysis -Health warfarin with a possibility of LP if mental status does not improve -They have also held amiodarone, pending p.o. speech evaluation  End stage renal disease  -On hemodialysis Tuesday Thursdays and Saturdays -Nephrology following -Status post hemodialysis yesterday  Dysphagia -Currently n.p.o., also altered mental status -Stable patient will be evaluated by speech -Continue IV amantadine   Diabetes mellitus II  -Checking blood sugars every 6 hours, as he is n.p.o. SSI  History of CVA versus seizures -Patient has been initiated on Keppra 500 mg twice daily -Precaution -Monitoring INR, holding Coumadin in anticipation of possible LP per neurology -MRI of the head negative for any intracranial abnormalities -EEG showing diffuse slowing -Ammonia level 29   CODE STATUS full code  Family wife present at bedside updated   Consultants:   PCCM  Nephro  Neuro  ID    Antimicrobials:   11/21/2017 -vancomycin ---> 11/22/2088 acyclovir --> 11/21/2088 meropenem -->     Anti-infectives (From admission, onward)   Start     Dose/Rate Route Frequency Ordered Stop   11/23/17 1800  acyclovir (ZOVIRAX) 460 mg in dextrose 5 % 100 mL IVPB     460 mg 109.2 mL/hr over 60 Minutes Intravenous Every 24 hours 11/22/17 1434     11/23/17 1413  vancomycin (VANCOCIN) 1-5 GM/200ML-% IVPB    Note to Pharmacy:  Cherylann Banas   : cabinet override  11/23/17 1413 11/23/17 1601   11/23/17 1200  vancomycin (VANCOCIN) IVPB 1000 mg/200 mL premix     1,000 mg 200 mL/hr over 60 Minutes Intravenous Every T-Th-Sa (Hemodialysis) 11/21/17 2326     11/22/17 1800  acyclovir (ZOVIRAX) 460 mg in dextrose 5 % 100 mL IVPB   Status:  Discontinued     460 mg 109.2 mL/hr over 60 Minutes Intravenous Every 24 hours 11/22/17 1416 11/22/17 1434   11/22/17 1445  acyclovir (ZOVIRAX) 460 mg in dextrose 5 % 100 mL IVPB     460 mg 109.2 mL/hr over 60 Minutes Intravenous  Once 11/22/17 1434 11/22/17 1604   11/21/17 2300  meropenem (MERREM) 500 mg in sodium chloride 0.9 % 100 mL IVPB     500 mg 200 mL/hr over 30 Minutes Intravenous Every 24 hours 11/21/17 2226     11/21/17 2300  vancomycin (VANCOCIN) 2,000 mg in sodium chloride 0.9 % 500 mL IVPB     2,000 mg 250 mL/hr over 120 Minutes Intravenous  Once 11/21/17 2249 11/22/17 0157       Objective: Vitals:   11/24/17 0406 11/24/17 1008 11/24/17 1350 11/24/17 1355  BP: 110/80 108/72 139/87   Pulse:  77 87   Resp: 14 14    Temp:  97.9 F (36.6 C)  98 F (36.7 C)  TempSrc:  Axillary  Axillary  SpO2: 97% 98% 97%   Weight:        Intake/Output Summary (Last 24 hours) at 11/24/2017 1542 Last data filed at 11/24/2017 1508 Gross per 24 hour  Intake 289.2 ml  Output 500 ml  Net -210.8 ml   Filed Weights   11/23/17 0500 11/23/17 1315 11/23/17 1715  Weight: 92.8 kg (204 lb 9.4 oz) 92.8 kg (204 lb 9.4 oz) 92.3 kg (203 lb 7.8 oz)    Examination:  General exam: Dominant, lethargic Psychiatry: Poor judgement and insight  HEENT: WNLs Respiratory system: Clear to auscultation. Respiratory effort normal. Cardiovascular system: S1 & S2 heard, RRR. No JVD, murmurs, rubs, gallops or clicks. No pedal edema. Gastrointestinal system: Abd. nondistended, soft and nontender. No organomegaly or masses felt. Normal bowel sounds heard. Central nervous system: Limited exam lethargic, able to move extremities,  Extremities: Upper extremities, bilateral BKA Skin: No rashes, Left tunneled IJ HD cath    Data Reviewed: I have personally reviewed following labs and imaging studies  CBC: Recent Labs  Lab 11/21/17 1832 11/21/17 1850 11/22/17 0612 11/23/17 0400 11/24/17 0511  WBC   --  6.5 11.2* 7.9 5.8  NEUTROABS  --  5.3  --   --   --   HGB 17.7* 14.7 12.2* 11.6* 11.2*  HCT 52.0 45.8 38.2* 35.9* 34.2*  MCV  --  93.5 93.6 91.6 90.0  PLT  --  173 167 162 086   Basic Metabolic Panel: Recent Labs  Lab 11/21/17 1832 11/21/17 1850 11/22/17 0612 11/23/17 0400 11/24/17 0511  NA 133* 135 137 140 136  K 5.6* 5.5* 4.6 4.8 3.9  CL 101 94* 105 105 96*  CO2  --  20* 22 19* 24  GLUCOSE 183* 189* 136* 96 78  BUN 51* 46* 51* 59* 29*  CREATININE 9.60* 9.58* 10.40* 11.32* 7.27*  CALCIUM  --  10.6* 9.2 9.1 8.7*  MG  --   --  1.9 2.0 1.9  PHOS  --   --  2.9 4.2 3.2   GFR: Estimated Creatinine Clearance: 11.4 mL/min (A) (by C-G formula based on SCr of 7.27 mg/dL (H)).  Liver Function Tests: Recent Labs  Lab 11/21/17 1850 11/22/17 0612  AST 35 36  ALT 16* 13*  ALKPHOS 56 41  BILITOT 0.6 0.7  PROT 8.7* 6.9  ALBUMIN 3.8 3.0*   No results for input(s): LIPASE, AMYLASE in the last 168 hours. Recent Labs  Lab 11/22/17 0612  AMMONIA 29   Coagulation Profile: Recent Labs  Lab 11/21/17 1850 11/22/17 0612 11/23/17 0400 11/24/17 0511  INR 2.51 2.80 2.64 1.59   Cardiac Enzymes: No results for input(s): CKTOTAL, CKMB, CKMBINDEX, TROPONINI in the last 168 hours. BNP (last 3 results) No results for input(s): PROBNP in the last 8760 hours. HbA1C: No results for input(s): HGBA1C in the last 72 hours. CBG: Recent Labs  Lab 11/23/17 2218 11/24/17 0034 11/24/17 0424 11/24/17 0756 11/24/17 1201  GLUCAP 87 87 74 83 88   Lipid Profile: No results for input(s): CHOL, HDL, LDLCALC, TRIG, CHOLHDL, LDLDIRECT in the last 72 hours. Thyroid Function Tests: No results for input(s): TSH, T4TOTAL, FREET4, T3FREE, THYROIDAB in the last 72 hours. Anemia Panel: No results for input(s): VITAMINB12, FOLATE, FERRITIN, TIBC, IRON, RETICCTPCT in the last 72 hours. Sepsis Labs: Recent Labs  Lab 11/22/17 0107 11/22/17 0612 11/22/17 0852 11/22/17 1143 11/23/17 0400  11/24/17 0511  PROCALCITON  --  40.31  --   --  68.84 70.93  LATICACIDVEN 5.64* 1.5 1.0 1.0  --   --     Recent Results (from the past 240 hour(s))  Culture, blood (single)     Status: None (Preliminary result)   Collection Time: 11/21/17 10:58 PM  Result Value Ref Range Status   Specimen Description BLOOD RIGHT ANTECUBITAL  Final   Special Requests   Final    BOTTLES DRAWN AEROBIC AND ANAEROBIC Blood Culture adequate volume   Culture   Final    NO GROWTH 3 DAYS Performed at [facility], [place] [address], [place], [place] [address]    Report Status PENDING  Incomplete  MRSA PCR Screening     Status: None   Collection Time: 11/22/17  4:28 AM  Result Value Ref Range Status   MRSA by PCR NEGATIVE NEGATIVE Final    Comment:        The GeneXpert MRSA Assay (FDA approved for NASAL specimens only), is one component of a comprehensive MRSA colonization surveillance program. It is not intended to diagnose MRSA infection nor to guide or monitor treatment for MRSA infections. Performed at [facility], [place] [address] [place], [place] [address]   Respiratory Panel by PCR     Status: None   Collection Time: 11/22/17  4:31 AM  Result Value Ref Range Status   Adenovirus NOT DETECTED NOT DETECTED Final   Coronavirus 229E NOT DETECTED NOT DETECTED Final   Coronavirus HKU1 NOT DETECTED NOT DETECTED Final   Coronavirus NL63 NOT DETECTED NOT DETECTED Final   Coronavirus OC43 NOT DETECTED NOT DETECTED Final   Metapneumovirus NOT DETECTED NOT DETECTED Final   Rhinovirus / Enterovirus NOT DETECTED NOT DETECTED Final   Influenza A NOT DETECTED NOT DETECTED Final   Influenza B NOT DETECTED NOT DETECTED Final   Parainfluenza Virus 1 NOT DETECTED NOT DETECTED Final   Parainfluenza Virus 2 NOT DETECTED NOT DETECTED Final   Parainfluenza Virus 3 NOT DETECTED NOT DETECTED Final   Parainfluenza Virus 4 NOT DETECTED NOT DETECTED Final   Respiratory Syncytial Virus NOT DETECTED NOT  DETECTED Final   Bordetella pertussis NOT DETECTED NOT DETECTED Final   Chlamydophila  pneumoniae NOT DETECTED NOT DETECTED Final   Mycoplasma pneumoniae NOT DETECTED NOT DETECTED Final  Culture, blood (single)     Status: None (Preliminary result)   Collection Time: 11/22/17  6:08 AM  Result Value Ref Range Status   Specimen Description BLOOD LEFT ANTECUBITAL  Final   Special Requests   Final    BOTTLES DRAWN AEROBIC ONLY Blood Culture adequate volume   Culture   Final    NO GROWTH 2 DAYS Performed at [facility], [address], [place], [place] [address]    Report Status PENDING  Incomplete  Culture, blood (single)     Status: None (Preliminary result)   Collection Time: 11/22/17  6:18 AM  Result Value Ref Range Status   Specimen Description BLOOD LEFT HAND  Final   Special Requests   Final    BOTTLES DRAWN AEROBIC ONLY Blood Culture adequate volume   Culture   Final    NO GROWTH 2 DAYS Performed at [facility], [place] [address], [place], [place] [address]    Report Status PENDING  Incomplete      Radiology Studies: Dg Chest 2 View  Result Date: 11/22/2017 CLINICAL DATA:  Pulmonary edema No chest pain or SOB EXAM: CHEST - 2 VIEW COMPARISON:  the previous day's study FINDINGS: Lungs are clear. Stable tunneled left IJ hemodialysis catheter to the right atrium. Stable dual lead right subclavian pacemaker. Heart size and mediastinal contours are within normal limits. Tortuous thoracic aorta. No effusion. Visualized bones unremarkable. IMPRESSION: No acute cardiopulmonary disease. Electronically Signed   By: Lucrezia Europe M.D.   On: 11/22/2017 20:51    Scheduled Meds: . amiodarone  200 mg Oral Daily  . atorvastatin  40 mg Oral QHS  . insulin aspart  2-6 Units Subcutaneous Q4H  . latanoprost  1 drop Both Eyes QHS  . mouth rinse  15 mL Mouth Rinse BID  . midodrine  5 mg Oral BID WC  . QUEtiapine  12.5 mg Oral QHS  . sodium chloride flush  3 mL Intravenous Q12H    Continuous Infusions: . sodium chloride    . acyclovir Stopped (11/23/17 1924)  . famotidine (PEPCID) IV Stopped (11/24/17 1324)  . meropenem (MERREM) IV Stopped (11/24/17 0119)  . vancomycin Stopped (11/23/17 1700)    Time spent: >45 minutes  Deatra James, MD [facility],  Pager [phone]  If 7PM-7AM, please contact night-coverage [email or web address]   Password [facility]  11/24/2017, 3:42 PM

## 2017-11-24 NOTE — Progress Notes (Signed)
  Speech Language Pathology Treatment: Dysphagia  Patient Details Name: Oscar Castillo MRN: [id number] DOB: 02-16-1951 Today's Date: 11/24/2017 Time: 6837-2902 SLP Time Calculation (min) (ACUTE ONLY): 10 min  Assessment / Plan / Recommendation Clinical Impression  Pt is more lethargic this morning, with minimal arousal despite multimodal stimulation and Max cues. Pt has his lips closed tightly and does not open them or make attempts to take in POs when brought to lips for tactile input. Pt should remain NPO when he is this lethargic; however, his wife said that pt did get some meds crushed in puree on previous date without overt signs of difficulty. Could continue giving meds crushed in puree and ice chips but only when pt is optimally alert.   HPI HPI: Pt is a 67 y.o m with ESRD on HD, Essential HTN, HCV, Afib on coumadin, Diabetes mellitus, Bipolar disorder, hx of renal stones, HFrEF, prior CVA who presented with acute encephalopathy, dysarthria, right facial droop, fatigue, and in septic shock. CT Head showed atrophy most prominent at the brainstem but no acute changes. MRI pending.      SLP Plan  Continue with current plan of care       Recommendations  Diet recommendations: NPO;Other(comment)(ice chips if alert) Medication Administration: Crushed with puree                Oral Care Recommendations: Oral care QID Follow up Recommendations: (tba) SLP Visit Diagnosis: Dysphagia, unspecified (R13.10) Plan: Continue with current plan of care       GO                Germain Osgood 11/24/2017, 10:12 AM  Germain Osgood, M.A. CCC-SLP [phone]

## 2017-11-24 NOTE — Progress Notes (Signed)
[facility] Progress Note  Subjective: awakens, slurred speech w some spont myoclonus  Vitals:   11/24/17 0406 11/24/17 1008 11/24/17 1350 11/24/17 1355  BP: 110/80 108/72 139/87   Pulse:  77 87   Resp: 14 14    Temp:  97.9 F (36.6 C)  98 F (36.7 C)  TempSrc:  Axillary  Axillary  SpO2: 97% 98% 97%   Weight:        Inpatient medications: . amiodarone  200 mg Oral Daily  . atorvastatin  40 mg Oral QHS  . insulin aspart  2-6 Units Subcutaneous Q4H  . latanoprost  1 drop Both Eyes QHS  . mouth rinse  15 mL Mouth Rinse BID  . midodrine  5 mg Oral BID WC  . QUEtiapine  12.5 mg Oral QHS  . sodium chloride flush  3 mL Intravenous Q12H   . sodium chloride    . acyclovir Stopped (11/23/17 1924)  . famotidine (PEPCID) IV Stopped (11/24/17 1324)  . meropenem (MERREM) IV Stopped (11/24/17 0119)  . vancomycin Stopped (11/23/17 1700)   sodium chloride, acetaminophen **OR** acetaminophen, albuterol, ondansetron (ZOFRAN) IV  Exam: Chron ill arousable and follows simple commands Mild spont myoclonus No jvd Chest cta bilat Cor reg no gallop abd obese soft ntnd bilat bka no edema Left chest tdc no drainage  Dialysis: [facility] 4h  91.5kg  2/2.5 bath p4  Hep 4000+ 2000 midrun  Left chest tdc Venofer 50mg  IV q week       Impression: 1 high fevers w ams- improving on iv abx for lumbar puncture when inr down 2 esrd no hd tts using tunneled hd cath, blood cx's negative 3 ams - better but remains confused 4 hypotension chronic on midodrine 5 anemia ckd hb up no esa need 6 afib on coumadin amio 7 dm2 per primary 8 mbd ckd no chg phoslo  Plan - dialysis tomorrow   Kelly Splinter MD [facility] pager [phone]   11/24/2017, 3:01 PM   Recent Labs  Lab 11/22/17 0612 11/23/17 0400 11/24/17 0511  NA 137 140 136  K 4.6 4.8 3.9  CL 105 105 96*  CO2 22 19* 24  GLUCOSE 136* 96 78  BUN 51* 59* 29*  CREATININE 10.40* 11.32* 7.27*  CALCIUM 9.2  9.1 8.7*  PHOS 2.9 4.2 3.2   Recent Labs  Lab 11/21/17 1850 11/22/17 0612  AST 35 36  ALT 16* 13*  ALKPHOS 56 41  BILITOT 0.6 0.7  PROT 8.7* 6.9  ALBUMIN 3.8 3.0*   Recent Labs  Lab 11/21/17 1850 11/22/17 0612 11/23/17 0400 11/24/17 0511  WBC 6.5 11.2* 7.9 5.8  NEUTROABS 5.3  --   --   --   HGB 14.7 12.2* 11.6* 11.2*  HCT 45.8 38.2* 35.9* 34.2*  MCV 93.5 93.6 91.6 90.0  PLT 173 167 162 171   Iron/TIBC/Ferritin/ %Sat    Component Value Date/Time   IRON 50 10/01/2016 0419   TIBC 167 (L) 10/01/2016 0419   FERRITIN 351 (H) 10/01/2016 0419   IRONPCTSAT 30 10/01/2016 0419

## 2017-11-24 NOTE — Consult Note (Signed)
[facility] for Infectious Disease    Date of Admission:  11/21/2017   Total days of antibiotics 4       Reason for Consult: Acute encephalopathy     Referring Provider: Dr. Roger Shelter  Primary Care Provider: Dr. [name]   Assessment: Oscar Castillo is a 66 y.o. male  ESRD on HD TTS, HTN, HCV, Afib on coumadin and amiodarone, complete heart block s/p ppm, bilateral BKA, T2DM, bipolar disorder, HFrEF with EF 55%, and prior CVA who presented on 4/29 with high grade fever, acute encephalopathy, dysarthria, right facial droop, and in septic shock. Head CT and brain MRI negative for CVA. Started on vancomycin, meropenem, and acyclovir 4 days ago without improvement in mental status. Unclear source of encephalopathy but thought to be infectious.   Vancomycin 4/28, 4/30> Meropenem 4/28>  Acyclovir 4/29>   Plan: 1. Would discontinue vancomycin  2. Continue meropenem and acyclovir pending LP  3. HIV pending   Active Problems:   Severe sepsis ([place])   Septic shock ([facility])   Acute encephalopathy   Pulmonary edema   Scheduled Meds: . amiodarone  200 mg Oral Daily  . atorvastatin  40 mg Oral QHS  . insulin aspart  2-6 Units Subcutaneous Q4H  . latanoprost  1 drop Both Eyes QHS  . mouth rinse  15 mL Mouth Rinse BID  . midodrine  5 mg Oral BID WC  . QUEtiapine  12.5 mg Oral QHS  . sodium chloride flush  3 mL Intravenous Q12H   Continuous Infusions: . sodium chloride    . acyclovir Stopped (11/23/17 1924)  . famotidine (PEPCID) IV Stopped (11/24/17 1324)  . meropenem (MERREM) IV Stopped (11/24/17 0119)  . vancomycin Stopped (11/23/17 1700)   PRN Meds:.sodium chloride, acetaminophen **OR** acetaminophen, albuterol, ondansetron (ZOFRAN) IV  HPI: Oscar Castillo is a 67 y.o. male  ESRD on HD TTS, HTN, HCV, Afib on coumadin and amiodarone, complete heart block s/p ppm, bilateral BKA, T2DM, Bipolar disorder, HFrEF with EF 55%, and prior CVA who presented on 4/29 with high  grade fever, acute encephalopathy, dysarthria, right facial droop, and in septic shock. He was admitted to the ICU due to need for vasopressors. Source of encephalopathy was thought to be infectious and he was started on vancomycin, meropenem (thought to have anaphylactic reaction to penicillin and cephalosporins), and acyclovir. However, infectious workup negative to date. MRI without acute findings and blood cultures negative to date. He is now afebrile  hemodynamically stable, but his mentation has not improved while on antibiotic/antiviral therapy. When seen this afternoon, he is somnolent. Stated he is feeling much better but unable to answer further questions and does not follow commands. Neurology has been following and planning for LP when INR< 1.5.    Review of Systems: Unable to obtain as patient is somnolent and not answering questions   Past Medical History:  Diagnosis Date  . Anemia   . Antral ulcer 2014   small  . BENIGN PROSTATIC HYPERTROPHY   . Bipolar disorder ([place])    "sometimes" (10/07/2016)  . CHOLELITHIASIS   . Chronic combined systolic and diastolic CHF (congestive heart failure) ([place])   . Complication of anesthesia    wife states pt had trouble waking up with in Nov., 2014  . CVA (cerebral vascular accident) ([place]) 07/2007   No residual effect  . DEPRESSION   . DIABETES MELLITUS, TYPE II    diet control.  No medication  since November 2015  . ERECTILE DYSFUNCTION   . ESRD on hemodialysis ([place])    ESRD due to DM/HTN. .  HD TTS at [facility] on [address].  [name] GERD   . GI bleed    due to gastritis, discharged 10/02/16/notes 10/07/2016  . Hepatitis C    C - has been treated  . History of Clostridium difficile   . History of kidney stones   . HYPERTENSION   . LBBB (left bundle branch block)   . Morbid obesity ([place])   . PAF (paroxysmal atrial fibrillation) ([place])    a. Dx 12/2015.    Social History   Tobacco Use  . Smoking status: Never Smoker  . Smokeless  tobacco: Never Used  Substance Use Topics  . Alcohol use: No  . Drug use: No    Family History  Problem Relation Age of Onset  . Diabetes Mother   . Hypertension Mother   . Heart attack Father   . Hypertension Father   . Coronary artery disease Other    Allergies  Allergen Reactions  . Ceftriaxone Sodium In Dextrose Anaphylaxis  . Ciprofloxacin Other (See Comments)    UNSPECIFIED PAIN  . Nsaids Other (See Comments)    HISTORY OF ULCER  . Penicillins Swelling    SWELLING REACTION UNSPECIFIED  [ PATIENT WITH HX OF ANAPHYLAXIS TO CEFTRIAXONE ]  . Sulfa Antibiotics Swelling    SWELLING REACTION UNSPECIFIED     OBJECTIVE: Blood pressure 139/87, pulse 87, temperature 98 F (36.7 C), temperature source Axillary, resp. rate 14, weight 203 lb 7.8 oz (92.3 kg), SpO2 97 %.  Physical Exam  Constitutional:  Patient is resting comfortably in bed   HENT:  Head: Normocephalic and atraumatic.  Eyes: Pupils are equal, round, and reactive to light. Conjunctivae are normal.  Neck: Normal range of motion. Neck supple.  Cardiovascular: Normal rate, regular rhythm and normal heart sounds. Exam reveals no gallop and no friction rub.  No murmur heard. Pulmonary/Chest: Effort normal and breath sounds normal. No respiratory distress. He has no wheezes. He has no rales.  Abdominal: Soft. Bowel sounds are normal. He exhibits no distension. There is no tenderness.  Musculoskeletal: He exhibits no edema.  Bilateral BKAs   Neurological:  Patient is somnolent. Does not answer questions or follows commands.  He does move all 4 extremities spontaneously.   Skin:  No rashes or lesions noted     Lab Results Lab Results  Component Value Date   WBC 5.8 11/24/2017   HGB 11.2 (L) 11/24/2017   HCT 34.2 (L) 11/24/2017   MCV 90.0 11/24/2017   PLT 171 11/24/2017    Lab Results  Component Value Date   CREATININE 7.27 (H) 11/24/2017   BUN 29 (H) 11/24/2017   NA 136 11/24/2017   K 3.9 11/24/2017    CL 96 (L) 11/24/2017   CO2 24 11/24/2017    Lab Results  Component Value Date   ALT 13 (L) 11/22/2017   AST 36 11/22/2017   ALKPHOS 41 11/22/2017   BILITOT 0.7 11/22/2017     Microbiology: Recent Results (from the past 240 hour(s))  Culture, blood (single)     Status: None (Preliminary result)   Collection Time: 11/21/17 10:58 PM  Result Value Ref Range Status   Specimen Description BLOOD RIGHT ANTECUBITAL  Final   Special Requests   Final    BOTTLES DRAWN AEROBIC AND ANAEROBIC Blood Culture adequate volume   Culture   Final  NO GROWTH 3 DAYS Performed at [facility], [place] [address], [place], [place] [address]    Report Status PENDING  Incomplete  MRSA PCR Screening     Status: None   Collection Time: 11/22/17  4:28 AM  Result Value Ref Range Status   MRSA by PCR NEGATIVE NEGATIVE Final    Comment:        The GeneXpert MRSA Assay (FDA approved for NASAL specimens only), is one component of a comprehensive MRSA colonization surveillance program. It is not intended to diagnose MRSA infection nor to guide or monitor treatment for MRSA infections. Performed at [facility], [place] [address], [place], [place] [address]   Respiratory Panel by PCR     Status: None   Collection Time: 11/22/17  4:31 AM  Result Value Ref Range Status   Adenovirus NOT DETECTED NOT DETECTED Final   Coronavirus 229E NOT DETECTED NOT DETECTED Final   Coronavirus HKU1 NOT DETECTED NOT DETECTED Final   Coronavirus NL63 NOT DETECTED NOT DETECTED Final   Coronavirus OC43 NOT DETECTED NOT DETECTED Final   Metapneumovirus NOT DETECTED NOT DETECTED Final   Rhinovirus / Enterovirus NOT DETECTED NOT DETECTED Final   Influenza A NOT DETECTED NOT DETECTED Final   Influenza B NOT DETECTED NOT DETECTED Final   Parainfluenza Virus 1 NOT DETECTED NOT DETECTED Final   Parainfluenza Virus 2 NOT DETECTED NOT DETECTED Final   Parainfluenza Virus 3 NOT DETECTED NOT DETECTED Final   Parainfluenza  Virus 4 NOT DETECTED NOT DETECTED Final   Respiratory Syncytial Virus NOT DETECTED NOT DETECTED Final   Bordetella pertussis NOT DETECTED NOT DETECTED Final   Chlamydophila pneumoniae NOT DETECTED NOT DETECTED Final   Mycoplasma pneumoniae NOT DETECTED NOT DETECTED Final  Culture, blood (single)     Status: None (Preliminary result)   Collection Time: 11/22/17  6:08 AM  Result Value Ref Range Status   Specimen Description BLOOD LEFT ANTECUBITAL  Final   Special Requests   Final    BOTTLES DRAWN AEROBIC ONLY Blood Culture adequate volume   Culture   Final    NO GROWTH 2 DAYS Performed at [facility], [address], [place], [place] [address]    Report Status PENDING  Incomplete  Culture, blood (single)     Status: None (Preliminary result)   Collection Time: 11/22/17  6:18 AM  Result Value Ref Range Status   Specimen Description BLOOD LEFT HAND  Final   Special Requests   Final    BOTTLES DRAWN AEROBIC ONLY Blood Culture adequate volume   Culture   Final    NO GROWTH 2 DAYS Performed at [facility], [place] [address], [place], [place] [address]    Report Status PENDING  Incomplete    Welford Roche, [facility] for Infectious [place] [phone] pager   [phone] cell 11/24/2017, 4:05 PM

## 2017-11-24 NOTE — Consult Note (Addendum)
Subjective: Patient awake in bed. Appears drowsy. Wife states that patient is doing worse than yesterday. Patient was at first cooperative with exam and then stopped following commands.  Exam: Vitals:   11/24/17 0406 11/24/17 1008  BP: 110/80 108/72  Pulse:  77  Resp: 14 14  Temp:  97.9 F (36.6 C)  SpO2: 97% 98%    Physical Exam   HEENT-  Normocephalic, balding, no lesions, without obvious abnormality.  Normal external eye and conjunctiva.   Cardiovascular- S1-S2 audible, radial pulses palpable . Extremities- Warm, dry and intact Musculoskeletal-hand appear swollen Skin-warm and dry, no suspicious lesions    Neuro:  Mental Status: Awake, oriented, thought content appropriate.  Speech has a low tone and the few words he will say are mumbled.  Able to follow some 3 step commands without difficulty. Unable to tell if patient stopped understanding instructions or became to drowsy to follow instructions. Cranial Nerves: II:   UTA  III,IV, VI: ptosis not present, patient would only look left. pupils equal, round, reactive to light and accommodation V,VII:  facial light touch sensation normal bilaterally VIII: hearing normal bilaterally   Motor: Right : Upper extremity   4/5    Left:     Upper extremity   4/5 Bilateral BKA: Lower extremity   0/5     Lower extremity  0 /5 Tone and bulk:normal tone throughout; no atrophy noted Sensory: light touch intact throughout, bilaterally Cerebellar: UTA  Gait: UTA  Medications:  Scheduled: . amiodarone  200 mg Oral Daily  . atorvastatin  40 mg Oral QHS  . insulin aspart  2-6 Units Subcutaneous Q4H  . latanoprost  1 drop Both Eyes QHS  . mouth rinse  15 mL Mouth Rinse BID  . midodrine  5 mg Oral BID WC  . QUEtiapine  12.5 mg Oral QHS  . sodium chloride flush  3 mL Intravenous Q12H   Continuous: . sodium chloride    . acyclovir Stopped (11/23/17 1924)  . famotidine (PEPCID) IV 20 mg (11/24/17 1046)  . meropenem (MERREM) IV  Stopped (11/24/17 0119)  . vancomycin Stopped (11/23/17 1700)   KKX:FGHWEX chloride, acetaminophen **OR** acetaminophen, albuterol, ondansetron (ZOFRAN) IV  Pertinent Labs/Diagnostics:   Dg Chest 2 View  Result Date: 11/22/2017 CLINICAL DATA:  Pulmonary edema No chest pain or SOB EXAM: CHEST - 2 VIEW COMPARISON:  the previous day's study FINDINGS: Lungs are clear. Stable tunneled left IJ hemodialysis catheter to the right atrium. Stable dual lead right subclavian pacemaker. Heart size and mediastinal contours are within normal limits. Tortuous thoracic aorta. No effusion. Visualized bones unremarkable. IMPRESSION: No acute cardiopulmonary disease. Electronically Signed   By: Lucrezia Europe M.D.   On: 11/22/2017 20:51   Mr Brain Wo Contrast  Result Date: 11/22/2017 CLINICAL DATA:  67 year old male with confusion. Code stroke presentation yesterday with right side facial droop. EXAM: MRI HEAD WITHOUT CONTRAST TECHNIQUE: Multiplanar, multiecho pulse sequences of the brain and surrounding structures were obtained without intravenous contrast. COMPARISON:  Head CT without contrast 11/21/2017 and earlier. Brain MRI 12/27/2015. FINDINGS: Brain: No restricted diffusion to suggest acute infarction. No midline shift, mass effect, evidence of mass lesion, ventriculomegaly, extra-axial collection or acute intracranial hemorrhage. Cervicomedullary junction and pituitary are within normal limits. Stable cerebral volume since 2017 with disproportionate atrophy of the brainstem. There is stable T2 heterogeneity throughout the mid and dorsal pons compatible with chronic lacunar infarcts. Cerebellar volume appears better preserved. Advanced chronic lacunar infarcts in the bilateral thalami some with associated  chronic microhemorrhage. The basal ganglia are relatively spared. There is chronic Patchy and confluent supratentorial cerebral white matter T2 and FLAIR hyperintensity. No new signal abnormality compared to the 2017  MRI. Vascular: Major intracranial vascular flow voids are stable. Skull and upper cervical spine: Stable and negative. Normal bone marrow signal. Sinuses/Orbits: Interval postoperative changes to the right globe. Otherwise stable and negative. Other: A mild-to-moderate left mastoid effusion is new since 2018. Negative nasopharynx. The right mastoids remain clear. Grossly normal other visible internal auditory structures. Scalp and face soft tissues appear negative. IMPRESSION: 1.  No acute intracranial abnormality. 2. Chronic brainstem atrophy with evidence of advanced chronic small vessel disease in the thalami and pons appears stable since a 2017 MRI. 3. Left mastoid effusion is new since 2018 and probably postinflammatory. Electronically Signed   By: Genevie Ann M.D.   On: 11/22/2017 11:28     Laurey Morale, NP-C [facility] [phone]  I have seen the patient reviewed the above note, my exam he is a little bit more awake, oriented to person, he does lift bilateral legs slightly. He does cross midline bilaterally   Impression: 67 yo M with dense encephalopathy in the setting of high fevers. Without definite source, I would favor empiric CNS coverage pending lumbar puncture.  There has been question of seizure in the beginning, and he had been started on Keppra briefly but on further questioning from the wife, sounds more like this was Rig[place] versus shivering.  Recommendations: 1) LP once INR is less than 1.5 2) neurology to follow  Roland Rack, MD [facility] [phone]  If 7pm- 7am, please page neurology on call as listed in [place].

## 2017-11-25 ENCOUNTER — Inpatient Hospital Stay (HOSPITAL_COMMUNITY): Payer: Medicare Other

## 2017-11-25 LAB — CBC WITH DIFFERENTIAL/PLATELET
BASOS PCT: 0 %
Basophils Absolute: 0 10*3/uL (ref 0.0–0.1)
EOS ABS: 0.1 10*3/uL (ref 0.0–0.7)
Eosinophils Relative: 2 %
HCT: 33.4 % — ABNORMAL LOW (ref 39.0–52.0)
HEMOGLOBIN: 11 g/dL — AB (ref 13.0–17.0)
LYMPHS ABS: 0.8 10*3/uL (ref 0.7–4.0)
Lymphocytes Relative: 14 %
MCH: 29.4 pg (ref 26.0–34.0)
MCHC: 32.9 g/dL (ref 30.0–36.0)
MCV: 89.3 fL (ref 78.0–100.0)
MONO ABS: 1 10*3/uL (ref 0.1–1.0)
MONOS PCT: 17 %
Neutro Abs: 3.8 10*3/uL (ref 1.7–7.7)
Neutrophils Relative %: 67 %
Platelets: 192 10*3/uL (ref 150–400)
RBC: 3.74 MIL/uL — ABNORMAL LOW (ref 4.22–5.81)
RDW: 16.9 % — AB (ref 11.5–15.5)
WBC: 5.7 10*3/uL (ref 4.0–10.5)

## 2017-11-25 LAB — PROTIME-INR
INR: 1.29
Prothrombin Time: 16 seconds — ABNORMAL HIGH (ref 11.4–15.2)

## 2017-11-25 LAB — PROTEIN AND GLUCOSE, CSF
GLUCOSE CSF: 65 mg/dL (ref 40–70)
Total  Protein, CSF: 31 mg/dL (ref 15–45)

## 2017-11-25 LAB — GLUCOSE, CAPILLARY
GLUCOSE-CAPILLARY: 73 mg/dL (ref 65–99)
GLUCOSE-CAPILLARY: 79 mg/dL (ref 65–99)
GLUCOSE-CAPILLARY: 112 mg/dL — AB (ref 65–99)
GLUCOSE-CAPILLARY: 80 mg/dL (ref 65–99)
GLUCOSE-CAPILLARY: 91 mg/dL (ref 65–99)
Glucose-Capillary: 67 mg/dL (ref 65–99)
Glucose-Capillary: 102 mg/dL — ABNORMAL HIGH (ref 65–99)

## 2017-11-25 LAB — CSF CELL COUNT WITH DIFFERENTIAL
RBC Count, CSF: 52 /mm3 — ABNORMAL HIGH
Tube #: 3
WBC CSF: 0 /mm3 (ref 0–5)

## 2017-11-25 LAB — CRYPTOCOCCAL ANTIGEN, CSF: Crypto Ag: NEGATIVE

## 2017-11-25 LAB — HIV ANTIBODY (ROUTINE TESTING W REFLEX): HIV SCREEN 4TH GENERATION: NONREACTIVE

## 2017-11-25 MED ORDER — WARFARIN SODIUM 2.5 MG PO TABS
2.5000 mg | ORAL_TABLET | Freq: Once | ORAL | Status: AC
  Administered 2017-11-25: 2.5 mg via ORAL
  Filled 2017-11-25: qty 1

## 2017-11-25 MED ORDER — LORAZEPAM 2 MG/ML IJ SOLN
INTRAMUSCULAR | Status: AC
  Administered 2017-11-25: 1 mg
  Filled 2017-11-25: qty 1

## 2017-11-25 MED ORDER — DEXTROSE 50 % IV SOLN
INTRAVENOUS | Status: AC
  Administered 2017-11-25: 25 mL
  Filled 2017-11-25: qty 50

## 2017-11-25 MED ORDER — WARFARIN - PHARMACIST DOSING INPATIENT
Freq: Every day | Status: DC
  Administered 2017-11-25: 19:00:00

## 2017-11-25 MED ORDER — LORAZEPAM 2 MG/ML IJ SOLN: 1.0000 mg | Freq: Once | INTRAMUSCULAR | Status: DC

## 2017-11-25 MED ORDER — VANCOMYCIN HCL IN DEXTROSE 1-5 GM/200ML-% IV SOLN
INTRAVENOUS | Status: AC
  Administered 2017-11-25: 1000 mg via INTRAVENOUS
  Filled 2017-11-25: qty 200

## 2017-11-25 NOTE — Progress Notes (Signed)
EEG complete - results pending 

## 2017-11-25 NOTE — Progress Notes (Signed)
PROGRESS NOTE    Patient: Oscar Castillo     PCP: [name], MD                    DOB: 1951-03-29            DOA: 11/21/2017 WIO:[id number]             DOS: 11/25/2017, 1:39 PM   LOS: 4 days   Date of Service: The patient was seen and examined on 11/25/2017  Subjective:   Patient was seen and examined this morning, still somnolent, lethargic, with vitals are stable, wife present at bedside.  Patient was able to wake up with a pain stimuli, follows some commands but felt back to sleep. Overnight he has been afebrile, normotensive, no leukocytosis.  Cultures has been negative to date  Holding Coumadin in anticipation of LP today  INR today 1.29  ----------------------------------------------------------------------------------------------------------------------  Brief Narrative:  67 y.o m with ESRD on HD TTS, Essential HTN, HCV, Afib on coumadin, complete heart block s/p ppm, bilateral BKA, Diabetes mellitus, Bipolar disorder, hx of renal stones, HFrEFtakes midodrine at home, prior CVA who presented with acute encephalopathy, dysarthria, right facial droop, fatigue, and in septic shock. Patient had received only 3hrs af his last dialysis session rather than the usual 4hrs he usually gets.   Active Problems:   Severe sepsis (HCC)   Septic shock (HCC)   Acute encephalopathy   Pulmonary edema   Assessment & Plan: Toxic metabolic encephalopathy -Possibly secondary infection/sepsis -Currently lethargic, positive pain stimuli -Vitals are stable -Investigate underlying cognitive -MRI of the head revealed no acute intracranial changes -Infectious disease following   Severe sepsis/septic shock -Unknown source or etiology, currently stable afebrile, normotensive -Blood culture from 428 and urine culture has been obtained, has been negative to date -follow-up accordingly  -Status post fluid resuscitation new drip taper -Vitals improved, -Improved WBC from 11.2-->7.9 -->5.7,  lactic acid -HIV nonreactive -Pending LP -She has been initiated on vancomycin, acyclovir, meropenem -ID consulted for evaluation recommendation  Hypotension secondary to above -Blood pressure stabilized improved after fluid resuscitation -Holding blood pressure medications -Status post treatment with neo-epinephrine, subsequently tapered down -Pressure currently stable  Pulmonary edema -Stable on O2 via nasal cannula -  History of atrial fibrillation/congestive heart failure -Continue cardiac monitoring -Daily weights -Monitoring I's and O's -On hemodialysis -Health warfarin with a possibility of LP if mental status does not improve -They have also held amiodarone, pending p.o. speech evaluation  End stage renal disease  -On hemodialysis Tuesday Thursdays and Saturdays -Nephrology following -Scheduled hemodialysis  Dysphagia -Currently n.p.o., also altered mental status -Stable patient will be evaluated by speech -Continue IV amantadine   Diabetes mellitus II  -Checking blood sugars every 6 hours, as he is n.p.o. SSI  History of CVA versus seizures -Patient has been initiated on Keppra 500 mg twice daily -Precaution -Monitoring INR, holding Coumadin in anticipation of possible LP per neurology -MRI of the head negative for any intracranial abnormalities -EEG showing diffuse slowing -Ammonia level 29   CODE STATUS full code  Family wife present at bedside updated   Consultants:   PCCM  Nephro  Neuro  ID    Antimicrobials:   11/21/2017 -vancomycin ---> 11/22/2088 acyclovir --> 11/21/2088 meropenem -->     Anti-infectives (From admission, onward)   Start     Dose/Rate Route Frequency Ordered Stop   11/23/17 1800  acyclovir (ZOVIRAX) 460 mg in dextrose 5 % 100 mL IVPB  460 mg 109.2 mL/hr over 60 Minutes Intravenous Every 24 hours 11/22/17 1434     11/23/17 1413  vancomycin (VANCOCIN) 1-5 GM/200ML-% IVPB    Note to Pharmacy:  Cherylann Banas   :  cabinet override      11/23/17 1413 11/23/17 1601   11/23/17 1200  vancomycin (VANCOCIN) IVPB 1000 mg/200 mL premix     1,000 mg 200 mL/hr over 60 Minutes Intravenous Every T-Th-Sa (Hemodialysis) 11/21/17 2326     11/22/17 1800  acyclovir (ZOVIRAX) 460 mg in dextrose 5 % 100 mL IVPB  Status:  Discontinued     460 mg 109.2 mL/hr over 60 Minutes Intravenous Every 24 hours 11/22/17 1416 11/22/17 1434   11/22/17 1445  acyclovir (ZOVIRAX) 460 mg in dextrose 5 % 100 mL IVPB     460 mg 109.2 mL/hr over 60 Minutes Intravenous  Once 11/22/17 1434 11/22/17 1604   11/21/17 2300  meropenem (MERREM) 500 mg in sodium chloride 0.9 % 100 mL IVPB     500 mg 200 mL/hr over 30 Minutes Intravenous Every 24 hours 11/21/17 2226     11/21/17 2300  vancomycin (VANCOCIN) 2,000 mg in sodium chloride 0.9 % 500 mL IVPB     2,000 mg 250 mL/hr over 120 Minutes Intravenous  Once 11/21/17 2249 11/22/17 0157       Objective: Vitals:   11/25/17 0816 11/25/17 1226 11/25/17 1240 11/25/17 1300  BP: 128/81 (!) (P) 189/80 (!) (P) 191/95 (!) (P) 187/92  Pulse: 77 (P) 76 (P) 78 (P) 76  Resp: 14 (P) 16 (P) 20 (P) 14  Temp:  (P) 98 F (36.7 C)    TempSrc:  (P) Oral    SpO2: 98% (P) 99%    Weight:        Intake/Output Summary (Last 24 hours) at 11/25/2017 1339 Last data filed at 11/25/2017 0604 Gross per 24 hour  Intake 459.2 ml  Output -  Net 459.2 ml   Filed Weights   11/23/17 0500 11/23/17 1315 11/23/17 1715  Weight: 92.8 kg (204 lb 9.4 oz) 92.8 kg (204 lb 9.4 oz) 92.3 kg (203 lb 7.8 oz)    Examination:  BP (!) (P) 187/92   Pulse (P) 76   Temp (P) 98 F (36.7 C) (Oral)   Resp (P) 14   Wt 92.3 kg (203 lb 7.8 oz)   SpO2 (P) 99%   BMI 29.20 kg/m    Physical Exam  Constitution: Lethargic   HEENT: Normocephalic, PERRL, otherwise with in Normal limits  Cardio vascular:  S1/S2, RRR, No murmure, No Rubs or Gallops  Chest/pulmonary: Clear to auscultation bilaterally, respirations unlabored  Chest  symmetric Abdomen: Soft, non-tender, non-distended, bowel sounds,no masses, no organomegaly Muscular skeletal: Limited exam -continues to be lethargic Neuro: CNII-XII intact. , normal motor and sensation, reflexes intact  Extremities: +2 pitting edema in arms and hand, bilateral BKA Skin: Dry, warm to touch,       Data Reviewed: I have personally reviewed following labs and imaging studies  CBC: Recent Labs  Lab 11/21/17 1850 11/22/17 0612 11/23/17 0400 11/24/17 0511 11/25/17 0837  WBC 6.5 11.2* 7.9 5.8 5.7  NEUTROABS 5.3  --   --   --  3.8  HGB 14.7 12.2* 11.6* 11.2* 11.0*  HCT 45.8 38.2* 35.9* 34.2* 33.4*  MCV 93.5 93.6 91.6 90.0 89.3  PLT 173 167 162 171 235   Basic Metabolic Panel: Recent Labs  Lab 11/21/17 1832 11/21/17 1850 11/22/17 0612 11/23/17 0400 11/24/17 0511  NA  133* 135 137 140 136  K 5.6* 5.5* 4.6 4.8 3.9  CL 101 94* 105 105 96*  CO2  --  20* 22 19* 24  GLUCOSE 183* 189* 136* 96 78  BUN 51* 46* 51* 59* 29*  CREATININE 9.60* 9.58* 10.40* 11.32* 7.27*  CALCIUM  --  10.6* 9.2 9.1 8.7*  MG  --   --  1.9 2.0 1.9  PHOS  --   --  2.9 4.2 3.2   GFR: Estimated Creatinine Clearance: 11.4 mL/min (A) (by C-G formula based on SCr of 7.27 mg/dL (H)). Liver Function Tests: Recent Labs  Lab 11/21/17 1850 11/22/17 0612  AST 35 36  ALT 16* 13*  ALKPHOS 56 41  BILITOT 0.6 0.7  PROT 8.7* 6.9  ALBUMIN 3.8 3.0*   No results for input(s): LIPASE, AMYLASE in the last 168 hours. Recent Labs  Lab 11/22/17 0612  AMMONIA 29   Coagulation Profile: Recent Labs  Lab 11/21/17 1850 11/22/17 0612 11/23/17 0400 11/24/17 0511 11/25/17 0349  INR 2.51 2.80 2.64 1.59 1.29   Cardiac Enzymes: No results for input(s): CKTOTAL, CKMB, CKMBINDEX, TROPONINI in the last 168 hours. BNP (last 3 results) No results for input(s): PROBNP in the last 8760 hours. HbA1C: No results for input(s): HGBA1C in the last 72 hours. CBG: Recent Labs  Lab 11/25/17 0101  11/25/17 0423 11/25/17 0745 11/25/17 0854 11/25/17 1114  GLUCAP 73 79 67 112* 80   Sepsis Labs: Recent Labs  Lab 11/22/17 0107 11/22/17 0612 11/22/17 0852 11/22/17 1143 11/23/17 0400 11/24/17 0511  PROCALCITON  --  40.31  --   --  68.84 70.93  LATICACIDVEN 5.64* 1.5 1.0 1.0  --   --     Recent Results (from the past 240 hour(s))  Culture, blood (single)     Status: None (Preliminary result)   Collection Time: 11/21/17 10:58 PM  Result Value Ref Range Status   Specimen Description BLOOD RIGHT ANTECUBITAL  Final   Special Requests   Final    BOTTLES DRAWN AEROBIC AND ANAEROBIC Blood Culture adequate volume   Culture   Final    NO GROWTH 3 DAYS Performed at [facility], [place] [address], [place], [place] [address]    Report Status PENDING  Incomplete  MRSA PCR Screening     Status: None   Collection Time: 11/22/17  4:28 AM  Result Value Ref Range Status   MRSA by PCR NEGATIVE NEGATIVE Final    Comment:        The GeneXpert MRSA Assay (FDA approved for NASAL specimens only), is one component of a comprehensive MRSA colonization surveillance program. It is not intended to diagnose MRSA infection nor to guide or monitor treatment for MRSA infections. Performed at [facility], [place] [address] [place],  [address]   Respiratory Panel by PCR     Status: None   Collection Time: 11/22/17  4:31 AM  Result Value Ref Range Status   Adenovirus NOT DETECTED NOT DETECTED Final   Coronavirus 229E NOT DETECTED NOT DETECTED Final   Coronavirus HKU1 NOT DETECTED NOT DETECTED Final   Coronavirus NL63 NOT DETECTED NOT DETECTED Final   Coronavirus OC43 NOT DETECTED NOT DETECTED Final   Metapneumovirus NOT DETECTED NOT DETECTED Final   Rhinovirus / Enterovirus NOT DETECTED NOT DETECTED Final   Influenza A NOT DETECTED NOT DETECTED Final   Influenza B NOT DETECTED NOT DETECTED Final   Parainfluenza Virus 1 NOT DETECTED NOT DETECTED Final  Parainfluenza Virus 2  NOT DETECTED NOT DETECTED Final   Parainfluenza Virus 3 NOT DETECTED NOT DETECTED Final   Parainfluenza Virus 4 NOT DETECTED NOT DETECTED Final   Respiratory Syncytial Virus NOT DETECTED NOT DETECTED Final   Bordetella pertussis NOT DETECTED NOT DETECTED Final   Chlamydophila pneumoniae NOT DETECTED NOT DETECTED Final   Mycoplasma pneumoniae NOT DETECTED NOT DETECTED Final  Culture, blood (single)     Status: None (Preliminary result)   Collection Time: 11/22/17  6:08 AM  Result Value Ref Range Status   Specimen Description BLOOD LEFT ANTECUBITAL  Final   Special Requests   Final    BOTTLES DRAWN AEROBIC ONLY Blood Culture adequate volume   Culture   Final    NO GROWTH 2 DAYS Performed at [facility], [place] [address] [place], [place] [address]    Report Status PENDING  Incomplete  Culture, blood (single)     Status: None (Preliminary result)   Collection Time: 11/22/17  6:18 AM  Result Value Ref Range Status   Specimen Description BLOOD LEFT HAND  Final   Special Requests   Final    BOTTLES DRAWN AEROBIC ONLY Blood Culture adequate volume   Culture   Final    NO GROWTH 2 DAYS Performed at [facility], [place] [address], [place], [place] [address]    Report Status PENDING  Incomplete  CSF culture with Stat gram stain     Status: None (Preliminary result)   Collection Time: 11/25/17 11:16 AM  Result Value Ref Range Status   Specimen Description CSF  Final   Special Requests NONE  Final   Gram Stain   Final    CYTOSPIN SMEAR WBC PRESENT, PREDOMINANTLY MONONUCLEAR NO ORGANISMS SEEN Performed at [facility], [place] [address] [place], [place] [address]    Culture PENDING  Incomplete   Report Status PENDING  Incomplete      Radiology Studies: No results found.  Scheduled Meds: . amiodarone  200 mg Oral Daily  . atorvastatin  40 mg Oral QHS  . insulin aspart  2-6 Units Subcutaneous Q4H  . latanoprost  1 drop Both Eyes QHS  . mouth rinse  15 mL Mouth Rinse  BID  . midodrine  5 mg Oral BID WC  . QUEtiapine  12.5 mg Oral QHS  . sodium chloride flush  3 mL Intravenous Q12H   Continuous Infusions: . sodium chloride    . acyclovir Stopped (11/24/17 2028)  . famotidine (PEPCID) IV Stopped (11/25/17 1108)  . meropenem (MERREM) IV Stopped (11/25/17 0131)  . vancomycin Stopped (11/23/17 1700)    Time spent: >35 minutes  Deatra James, MD [facility],  Pager [phone]  If 7PM-7AM, please contact night-coverage [email or web address]   Password [email or web address]  11/25/2017, 1:39 PM

## 2017-11-25 NOTE — Progress Notes (Signed)
ANTICOAGULATION CONSULT NOTE - Initial Consult  Pharmacy Consult for Warfarin  Indication: atrial fibrillation  Allergies  Allergen Reactions  . Ceftriaxone Sodium In Dextrose Anaphylaxis  . Ciprofloxacin Other (See Comments)    UNSPECIFIED PAIN  . Nsaids Other (See Comments)    HISTORY OF ULCER  . Penicillins Swelling    SWELLING REACTION UNSPECIFIED  [ PATIENT WITH HX OF ANAPHYLAXIS TO CEFTRIAXONE ]  . Sulfa Antibiotics Swelling    SWELLING REACTION UNSPECIFIED    Patient Measurements: Weight: 203 lb 7.8 oz (92.3 kg)  Vital Signs: Temp: 98 F (36.7 C) (05/02 1724) Temp Source: Oral (05/02 1724) BP: 157/80 (05/02 1724) Pulse Rate: 82 (05/02 1652)  Labs: Recent Labs    11/23/17 0400 11/24/17 0511 11/25/17 0349 11/25/17 0837  HGB 11.6* 11.2*  --  11.0*  HCT 35.9* 34.2*  --  33.4*  PLT 162 171  --  192  LABPROT 27.9* 18.8* 16.0*  --   INR 2.64 1.59 1.29  --   CREATININE 11.32* 7.27*  --   --     Estimated Creatinine Clearance: 11.4 mL/min (A) (by C-G formula based on SCr of 7.27 mg/dL (H)).   Medical History: Past Medical History:  Diagnosis Date  . Anemia   . Antral ulcer 2014   small  . BENIGN PROSTATIC HYPERTROPHY   . Bipolar disorder ([place])    "sometimes" (10/07/2016)  . CHOLELITHIASIS   . Chronic combined systolic and diastolic CHF (congestive heart failure) ([place])   . Complication of anesthesia    wife states pt had trouble waking up with in Nov., 2014  . CVA (cerebral vascular accident) ([place]) 07/2007   No residual effect  . DEPRESSION   . DIABETES MELLITUS, TYPE II    diet control.  No medication  since November 2015  . ERECTILE DYSFUNCTION   . ESRD on hemodialysis ([place])    ESRD due to DM/HTN. .  HD TTS at [facility] on [address].  [name] GERD   . GI bleed    due to gastritis, discharged 10/02/16/notes 10/07/2016  . Hepatitis C    C - has been treated  . History of Clostridium difficile   . History of kidney stones   . HYPERTENSION   . LBBB  (left bundle branch block)   . Morbid obesity ([place])   . PAF (paroxysmal atrial fibrillation) ([place])    a. Dx 12/2015.   Assessment: 67 y/o M with dense encephalopathy in the setting of high fevers, on warfarin PTA for afib. INR was therapeutic on admission at 2.51, ESRD on HD. Warfarin was held for LP and patient received 5 mg IV vitK on 4/30. INR today is subtherapeutic as expected at 1.29. H/H trending down, Plt stable. Pt is has been and is currently NPO. Pt also on amiodarone continued from PTA. Will give one time dose and follow up diet and changes.  Warfarin PTA dosing: 1.25 mg on Saturdays, 2.5 mg all other days  Goal of Therapy:  INR 2-3 Monitor platelets by anticoagulation protocol: Yes   Plan:  Give warfarin 2.5 mg po x 1 Monitor daily INR, CBC, clinical course, s/sx of bleed, PO intake, DDI   Thank you for allowing [place] to participate in this patients care.  Jens Som, PharmD Clinical phone for 11/25/2017 from 3:30-10:30p: [phone] If after 10:30p, please call main pharmacy at: [phone] 11/25/2017 6:24 PM

## 2017-11-25 NOTE — Progress Notes (Signed)
Pharmacy Antibiotic Note Assessment: Oscar Castillo is a 67 y.o. male admitted on 11/21/2017 with meningitis.  Pharmacy  Consult continues for Vancomycin, merropenem and acyclovir dosing for meningitis. WBC has improved to wnl.  Afebrile. Noted that patient remains confused. ESRD on HD TTSat. Hemodialysis today.   S/p  lumbar puncture done today 5/2.  Dr. Isac Sarna, ID consult recommends to stop IV vancomycin and merropenem.   Plan: Antibiotics continue:  Acyclovir 460mg  IV Q24H (5mg /kg/dose using TBW) Vancomycin 1000mg  IV qHD (Tu/Th/Sa) Merrem 500mg  IV Q24H Monitor CBC, renal and hepatic function, LP, cultures, and LOT. Check pre HD vancomycin level with 3rd or 4th dose.  Consider Discontinuing IV vancomycin and merropenem.   Weight: 203 lb 7.8 oz (92.3 kg)  Temp (24hrs), Avg:98 F (36.7 C), Min:97.8 F (36.6 C), Max:98.2 F (36.8 C)  Recent Labs  Lab 11/21/17 1832 11/21/17 1850 11/21/17 2307 11/22/17 0107 11/22/17 0612 11/22/17 0852 11/22/17 1143 11/23/17 0400 11/24/17 0511 11/25/17 0837  WBC  --  6.5  --   --  11.2*  --   --  7.9 5.8 5.7  CREATININE 9.60* 9.58*  --   --  10.40*  --   --  11.32* 7.27*  --   LATICACIDVEN  --   --  2.16* 5.64* 1.5 1.0 1.0  --   --   --     Estimated Creatinine Clearance: 11.4 mL/min (A) (by C-G formula based on SCr of 7.27 mg/dL (H)).    Allergies  Allergen Reactions  . Ceftriaxone Sodium In Dextrose Anaphylaxis  . Ciprofloxacin Other (See Comments)    UNSPECIFIED PAIN  . Nsaids Other (See Comments)    HISTORY OF ULCER  . Penicillins Swelling    SWELLING REACTION UNSPECIFIED  [ PATIENT WITH HX OF ANAPHYLAXIS TO CEFTRIAXONE ]  . Sulfa Antibiotics Swelling    SWELLING REACTION UNSPECIFIED     Antimicrobials this admission: 4/28 Vanc >> 4/28 Merrem >> 4/29 Acyclovir >>  Dose adjustments this admission: None  Microbiology results: 4/28 BCx: NGTD 4/29 RVP: neg 4/29 MRSA PCR: neg 4/29 BCx: pending  Thank you for  allowing pharmacy to be a part of this patient's care.  Nicole Cella, RPh Clinical Pharmacist Pager: [phone] 11/25/2017 2:57 PM

## 2017-11-25 NOTE — Progress Notes (Signed)
Initial Nutrition Assessment  DOCUMENTATION CODES:   Not applicable  INTERVENTION:  Monitor for diet advancement NPO per speech therapy due to lethargy  Pro-stat 55mL BID each supplement provides 100 calories and 15 grams of protein with diet advancement  NUTRITION DIAGNOSIS:   Inadequate oral intake related to inability to eat(lethargy per SLP) as evidenced by NPO status  GOAL:   Patient will meet greater than or equal to 90% of their needs  MONITOR:   Labs, Diet advancement, Weight trends  REASON FOR ASSESSMENT:   Low Braden    ASSESSMENT:   67 y.o m with ESRD on HD TTS, Essential HTN, HCV, Afib on coumadin, complete heart block s/p ppm, bilateral BKA, Diabetes mellitus, Bipolar disorder, hx of renal stones, HFrEF takes midodrine at home, prior CVA who presented with acute encephalopathy, dysarthria, right facial droop, fatigue, and in septic shock. Patient had received only 3hrs af his last dialysis session rather than the usual 4hrs he usually gets.   Spoke with Mr. Matusek while he was in HD. He was unable to respond appropriately, was answering "yes" to everything and singing. Patient's wife was not here at this time. Looks good from NFPE standpoint, see below. Currently NPO due to lethargy per speech.  Per previous RD notes he normally eats 3 meals a day with no issues. Previous weights appear to be stated, has fluctuated between 200-204 pounds this admission which is close to previously stated weights of 205-210 pounds.  EDW: 91.5 kg (201 lbs)  Will monitor diet advancement and PO intake  Labs reviewed:  BUN/Creatinine 29/7.27  Medications reviewed and include:  Insulin  NUTRITION - FOCUSED PHYSICAL EXAM:    Most Recent Value  Orbital Region  No depletion  Upper Arm Region  No depletion  Thoracic and Lumbar Region  No depletion  Buccal Region  No depletion  Temple Region  No depletion  Clavicle Bone Region  No depletion  Clavicle and Acromion Bone  Region  No depletion  Scapular Bone Region  No depletion  Dorsal Hand  No depletion  Patellar Region  No depletion  Anterior Thigh Region  No depletion  Posterior Calf Region  No depletion  Edema (RD Assessment)  Mild       Diet Order:   Diet Order           Diet NPO time specified  Diet effective now          EDUCATION NEEDS:   Not appropriate for education at this time  Skin:  Skin Assessment: Skin Integrity Issues: Skin Integrity Issues:: Stage I Stage I: sacrum  Last BM:  11/22/2017  Height:   Ht Readings from Last 1 Encounters:  10/25/17 5\' 10"  (1.778 m)    Weight:   Wt Readings from Last 1 Encounters:  11/23/17 203 lb 7.8 oz (92.3 kg)    Ideal Body Weight:  68.02 kg  BMI:  Body mass index is 29.2 kg/m.  Estimated Nutritional Needs:   Kcal:  2200-2600 calories  Protein:  130-150 grams  Fluid:  UOP +1L  Satira Anis. Ehab Humber, MS, RD LDN Inpatient Clinical Dietitian Pager [phone]

## 2017-11-25 NOTE — Care Management Important Message (Signed)
Important Message  Patient Details  Name: Oscar Castillo MRN: [id number] Date of Birth: 05-Jan-1951   Medicare Important Message Given:  Yes    [name] 11/25/2017, 1:59 PM

## 2017-11-25 NOTE — Progress Notes (Addendum)
Subjective: Has become progressively more encephalopathic over the past 24 hours  Exam: Vitals:   11/25/17 1652 11/25/17 1724  BP: (!) 174/74 (!) 157/80  Pulse: 82   Resp: 18 (!) 21  Temp: 97.7 F (36.5 C) 98 F (36.7 C)  SpO2: 100% 97%   Gen: In bed, NAD Resp: non-labored breathing, no acute distress Abd: soft, nt  Neuro: MS: Eyes open to voice, follows some simple commands and tells me his name, perseverates repeatedly. CN: He blink to threat on the right but not from the left, Motor: He lives all extremities against gravity Sensory: Response to noxious stimuli bilaterally  Pertinent Labs: CSF without evidence of infection  Impression: 67 year old male with progressive encephalopathy after presentation most consistent with sepsis.  At this point, I suspect that this represents metabolic encephalopathy associated with his infection.  Given his unusual semiology, I did attempt 1 mg challenge of Ativan during his EEG, however this produced no clinical response on his EEG does not appear supportive of epileptic discharges.  He initially improved, but subsequently worsened, though there is no evidence for analysis and epileptic nature to his encephalopathy if he does not improve by tomorrow I think continuous EEG may be prudent.  Also with holding his Coumadin, ischemic stroke has to be considered an MRI will be ordered.  Of note, acyclovir can be associated with encephalopathy.  Recommendations: 1) MRI brain 2) neurology to continue to follow  Roland Rack, MD [facility] [phone]  If 7pm- 7am, please page neurology on call as listed in Max.

## 2017-11-25 NOTE — Progress Notes (Signed)
[name]ious [facility]  Date of Admission:  11/21/2017   Total days of antibiotics 5       ASSESSMENT: Oscar Castillo is a 67 y.o. male  ESRD on HDTTS, HTN, HCV, Afib on coumadin and amiodarone,complete heart block s/p ppm, bilateral BKA,T2DM, bipolar disorder, HFrEF with EF 55%, and prior CVA who presented on 4/29 with high grade fever, acute encephalopathy, dysarthria, right facial droop, and in septic shock. Head CT and brain MRI negative for CVA. Started on vancomycin, meropenem, and acyclovir 4 days ago without improvement in mental status. Unclear source of encephalopathy but thought to be infectious.   Vancomycin 4/28, 4/30> Meropenem 4/28>  Acyclovir 4/29>   CSF studies: normal cell count, protein and glucose, crypto antigen negative, gram stain negative, HSV and culture pending     PLAN: 1. Discontinue vancomycin and meropenem  2. Can continue acyclovir pending CSF HSV   Active Problems:   Severe sepsis (HCC)   Septic shock (HCC)   Acute encephalopathy   Pulmonary edema   Scheduled Meds: . amiodarone  200 mg Oral Daily  . atorvastatin  40 mg Oral QHS  . insulin aspart  2-6 Units Subcutaneous Q4H  . latanoprost  1 drop Both Eyes QHS  . mouth rinse  15 mL Mouth Rinse BID  . midodrine  5 mg Oral BID WC  . QUEtiapine  12.5 mg Oral QHS  . sodium chloride flush  3 mL Intravenous Q12H   Continuous Infusions: . sodium chloride    . acyclovir Stopped (11/24/17 2028)  . famotidine (PEPCID) IV Stopped (11/25/17 1108)  . meropenem (MERREM) IV Stopped (11/25/17 0131)  . vancomycin Stopped (11/23/17 1700)   PRN Meds:.sodium chloride, acetaminophen **OR** acetaminophen, albuterol, ondansetron (ZOFRAN) IV   SUBJECTIVE: No acute events overnight. Afebrile. Remains somnolent and somewhat arousable to voice. LP done today. Cell count unremarkable. CSF HSV pending.   Review of Systems: Unable to obtain as patient is somnolent and does not respond to  questions.   Allergies  Allergen Reactions  . Ceftriaxone Sodium In Dextrose Anaphylaxis  . Ciprofloxacin Other (See Comments)    UNSPECIFIED PAIN  . Nsaids Other (See Comments)    HISTORY OF ULCER  . Penicillins Swelling    SWELLING REACTION UNSPECIFIED  [ PATIENT WITH HX OF ANAPHYLAXIS TO CEFTRIAXONE ]  . Sulfa Antibiotics Swelling    SWELLING REACTION UNSPECIFIED     OBJECTIVE: Vitals:   11/25/17 0008 11/25/17 0530 11/25/17 0800 11/25/17 0816  BP: (!) 150/92 98/65  128/81  Pulse:  76  77  Resp: 17 15  14   Temp: 97.8 F (36.6 C) 98 F (36.7 C) 98 F (36.7 C)   TempSrc: Axillary Axillary Oral   SpO2: 95% 99%  98%  Weight:       Body mass index is 29.2 kg/m.  Physical Exam  Constitutional:  Elderly male resting comfortably in bed during HD  HENT:  Head: Normocephalic and atraumatic.  Unable to evaluate OP as patient not following commands   Eyes: Pupils are equal, round, and reactive to light. Conjunctivae are normal.  Neck: Normal range of motion. Neck supple.  Cardiovascular: Normal rate and regular rhythm. Exam reveals no gallop and no friction rub.  No murmur heard. Pulmonary/Chest: Effort normal and breath sounds normal. No respiratory distress. He has no wheezes. He has no rales.  Abdominal: Soft. Bowel sounds are normal. He exhibits no distension. There is no tenderness.  Musculoskeletal: He exhibits deformity (bilateral BKas). He exhibits no edema.  Neurological:  Somnolent, somewhat arousable to voice, does not follow commands   Skin: No rash noted.     Lab Results Lab Results  Component Value Date   WBC 5.7 11/25/2017   HGB 11.0 (L) 11/25/2017   HCT 33.4 (L) 11/25/2017   MCV 89.3 11/25/2017   PLT 192 11/25/2017    Lab Results  Component Value Date   CREATININE 7.27 (H) 11/24/2017   BUN 29 (H) 11/24/2017   NA 136 11/24/2017   K 3.9 11/24/2017   CL 96 (L) 11/24/2017   CO2 24 11/24/2017    Lab Results  Component Value Date   ALT 13 (L)  11/22/2017   AST 36 11/22/2017   ALKPHOS 41 11/22/2017   BILITOT 0.7 11/22/2017     Microbiology: Recent Results (from the past 240 hour(s))  Culture, blood (single)     Status: None (Preliminary result)   Collection Time: 11/21/17 10:58 PM  Result Value Ref Range Status   Specimen Description BLOOD RIGHT ANTECUBITAL  Final   Special Requests   Final    BOTTLES DRAWN AEROBIC AND ANAEROBIC Blood Culture adequate volume   Culture   Final    NO GROWTH 3 DAYS Performed at [facility], [place] [address] [place], [place] [address]    Report Status PENDING  Incomplete  MRSA PCR Screening     Status: None   Collection Time: 11/22/17  4:28 AM  Result Value Ref Range Status   MRSA by PCR NEGATIVE NEGATIVE Final    Comment:        The GeneXpert MRSA Assay (FDA approved for NASAL specimens only), is one component of a comprehensive MRSA colonization surveillance program. It is not intended to diagnose MRSA infection nor to guide or monitor treatment for MRSA infections. Performed at [facility], [place] [address], [place], [place] [address]   Respiratory Panel by PCR     Status: None   Collection Time: 11/22/17  4:31 AM  Result Value Ref Range Status   Adenovirus NOT DETECTED NOT DETECTED Final   Coronavirus 229E NOT DETECTED NOT DETECTED Final   Coronavirus HKU1 NOT DETECTED NOT DETECTED Final   Coronavirus NL63 NOT DETECTED NOT DETECTED Final   Coronavirus OC43 NOT DETECTED NOT DETECTED Final   Metapneumovirus NOT DETECTED NOT DETECTED Final   Rhinovirus / Enterovirus NOT DETECTED NOT DETECTED Final   Influenza A NOT DETECTED NOT DETECTED Final   Influenza B NOT DETECTED NOT DETECTED Final   Parainfluenza Virus 1 NOT DETECTED NOT DETECTED Final   Parainfluenza Virus 2 NOT DETECTED NOT DETECTED Final   Parainfluenza Virus 3 NOT DETECTED NOT DETECTED Final   Parainfluenza Virus 4 NOT DETECTED NOT DETECTED Final   Respiratory Syncytial Virus NOT DETECTED NOT DETECTED  Final   Bordetella pertussis NOT DETECTED NOT DETECTED Final   Chlamydophila pneumoniae NOT DETECTED NOT DETECTED Final   Mycoplasma pneumoniae NOT DETECTED NOT DETECTED Final  Culture, blood (single)     Status: None (Preliminary result)   Collection Time: 11/22/17  6:08 AM  Result Value Ref Range Status   Specimen Description BLOOD LEFT ANTECUBITAL  Final   Special Requests   Final    BOTTLES DRAWN AEROBIC ONLY Blood Culture adequate volume   Culture   Final    NO GROWTH 2 DAYS Performed at [facility], [address], [place], [place] [address]    Report Status  PENDING  Incomplete  Culture, blood (single)     Status: None (Preliminary result)   Collection Time: 11/22/17  6:18 AM  Result Value Ref Range Status   Specimen Description BLOOD LEFT HAND  Final   Special Requests   Final    BOTTLES DRAWN AEROBIC ONLY Blood Culture adequate volume   Culture   Final    NO GROWTH 2 DAYS Performed at [facility], [address] [place], [place] [address]    Report Status PENDING  Incomplete  CSF culture with Stat gram stain     Status: None (Preliminary result)   Collection Time: 11/25/17 11:16 AM  Result Value Ref Range Status   Specimen Description CSF  Final   Special Requests NONE  Final   Gram Stain   Final    CYTOSPIN SMEAR WBC PRESENT, PREDOMINANTLY MONONUCLEAR NO ORGANISMS SEEN Performed at [facility], [place] [address] [place], [place] [address]    Culture PENDING  Incomplete   Report Status PENDING  Incomplete    Welford Roche, [facility] [place] [phone] pager   [phone] cell 11/25/2017, 1:15 PM

## 2017-11-25 NOTE — Procedures (Signed)
History: 67 year old male being evaluated for encephalopathy  Sedation: None  Technique: This is a 21 channel routine scalp EEG performed at the bedside with bipolar and monopolar montages arranged in accordance to the international 10/20 system of electrode placement. One channel was dedicated to EKG recording.    Background: The background consists of relatively low voltage irregular delta and theta activities.  There are occasional blunted discharges which are frontally predominant with triphasic morphology.  There is no definite posterior dominant rhythm.  Photic stimulation: Physiologic driving is not performed  EEG Abnormalities: 1) generalized irregular slow activity 2) triphasic waves  Clinical Interpretation: This EEG is consistent with a generalized nonspecific cerebral dysfunction (encephalopathy).   There was no seizure or seizure predisposition recorded on this study. Please note that a normal EEG does not preclude the possibility of epilepsy.   Roland Rack, MD [facility] [phone]  If 7pm- 7am, please page neurology on call as listed in [place].

## 2017-11-25 NOTE — Procedures (Signed)
Indication: sepsis  Risks of the procedure were dicussed with the patient including post-LP headache, bleeding, infection, weakness/numbness of legs(radiculopathy), death.  The patient's proxy agreed and written consent was obtained.   INR 1.29  The patient was prepped and draped, and using sterile technique a 20 gauge quinke spinal needle was inserted in the L4/L5 space. The opening pressure was 17 mm H2O closing 12 mm H2O. Approximately 12 cc of CSF were obtained and sent for analysis.   One attempt No complications  Etta Quill PA-C [facility] [phone]  M-F  (9:00 am- 5:00 PM)  11/25/2017, 11:13 AM

## 2017-11-25 NOTE — Progress Notes (Signed)
  Speech Language Pathology Treatment: Dysphagia  Patient Details Name: Oscar Castillo MRN: [id number] DOB: March 29, 1951 Today's Date: 11/25/2017 Time: 6333-5456 SLP Time Calculation (min) (ACUTE ONLY): 15 min  Assessment / Plan / Recommendation Clinical Impression  Pt remains too lethargic for PO intake. He stirs minimally to tactile/auditory stimulation, but does not open his mouth to accept POs despite Max multimodal cueing. His wife says that he is able to take in a few bites of applesauce a day when he is alert. Could continue doing this, but he is not yet ready for meal trays. Would remain NPO except for ice chips/meds in puree when alert. MD may wish to consider temporary alternative means of nutrition.   HPI HPI: Pt is a 67 y.o m with ESRD on HD, Essential HTN, HCV, Afib on coumadin, Diabetes mellitus, Bipolar disorder, hx of renal stones, HFrEF, prior CVA who presented with acute encephalopathy, dysarthria, right facial droop, fatigue, and in septic shock. CT Head showed atrophy most prominent at the brainstem but no acute changes. MRI pending.      SLP Plan  Continue with current plan of care       Recommendations  Diet recommendations: NPO;Other(comment)(ice chips if alert) Medication Administration: Crushed with puree                Oral Care Recommendations: Oral care QID Follow up Recommendations: (tba) SLP Visit Diagnosis: Dysphagia, unspecified (R13.10) Plan: Continue with current plan of care       GO                Oscar Castillo 11/25/2017, 9:31 AM  Oscar Castillo, M.A. CCC-SLP [phone]

## 2017-11-25 NOTE — Progress Notes (Signed)
[facility] Progress Note  Subjective: on HD not communicating verbally  Vitals:   11/25/17 1240 11/25/17 1300 11/25/17 1330 11/25/17 1400  BP: (!) 191/95 (!) 187/92 (!) 158/30 (!) 162/80  Pulse: 78 76 74 78  Resp: 20 14 10 17   Temp:      TempSrc:      SpO2:      Weight:        Inpatient medications: . amiodarone  200 mg Oral Daily  . atorvastatin  40 mg Oral QHS  . insulin aspart  2-6 Units Subcutaneous Q4H  . latanoprost  1 drop Both Eyes QHS  . mouth rinse  15 mL Mouth Rinse BID  . midodrine  5 mg Oral BID WC  . QUEtiapine  12.5 mg Oral QHS  . sodium chloride flush  3 mL Intravenous Q12H   . sodium chloride    . acyclovir Stopped (11/24/17 2028)  . famotidine (PEPCID) IV Stopped (11/25/17 1108)  . meropenem (MERREM) IV Stopped (11/25/17 0131)  . vancomycin Stopped (11/23/17 1700)   sodium chloride, acetaminophen **OR** acetaminophen, albuterol, ondansetron (ZOFRAN) IV  Exam: Chron ill arousable and follows simple commands Mild spont myoclonus No jvd Chest cta bilat Cor reg no gallop abd obese soft ntnd bilat bka no edema Left chest tdc no drainage  [facility]  91.5kg  2/2.5 bath p4  Hep 4000+ 2000 midrun  Left chest tdc Venofer 50mg  IV q week       Impression: 1 high fevers w ams- remains confused, for lumbar punct today 2 esrd hd tts - using tunneled hd cath, blood cx's negative 3 hypotension chronic on midodrine 4 anemia ckd hb up no esa need 5 afib on coumadin amio 6 dm2 per primary 7 mbd ckd no chg phoslo 8 hx cva and/or seizures - started on keppra here, mri head negative  Plan - dialysis today, min UF   Kelly Splinter MD [facility] pager [phone]   11/25/2017, 2:11 PM   Recent Labs  Lab 11/22/17 0612 11/23/17 0400 11/24/17 0511  NA 137 140 136  K 4.6 4.8 3.9  CL 105 105 96*  CO2 22 19* 24  GLUCOSE 136* 96 78  BUN 51* 59* 29*  CREATININE 10.40* 11.32* 7.27*  CALCIUM 9.2 9.1 8.7*  PHOS  2.9 4.2 3.2   Recent Labs  Lab 11/21/17 1850 11/22/17 0612  AST 35 36  ALT 16* 13*  ALKPHOS 56 41  BILITOT 0.6 0.7  PROT 8.7* 6.9  ALBUMIN 3.8 3.0*   Recent Labs  Lab 11/21/17 1850  11/23/17 0400 11/24/17 0511 11/25/17 0837  WBC 6.5   < > 7.9 5.8 5.7  NEUTROABS 5.3  --   --   --  3.8  HGB 14.7   < > 11.6* 11.2* 11.0*  HCT 45.8   < > 35.9* 34.2* 33.4*  MCV 93.5   < > 91.6 90.0 89.3  PLT 173   < > 162 171 192   < > = values in this interval not displayed.   Iron/TIBC/Ferritin/ %Sat    Component Value Date/Time   IRON 50 10/01/2016 0419   TIBC 167 (L) 10/01/2016 0419   FERRITIN 351 (H) 10/01/2016 0419   IRONPCTSAT 30 10/01/2016 0419

## 2017-11-26 ENCOUNTER — Inpatient Hospital Stay (HOSPITAL_COMMUNITY): Payer: Medicare Other

## 2017-11-26 DIAGNOSIS — G928 Other toxic encephalopathy: Secondary | ICD-10-CM | POA: Diagnosis present

## 2017-11-26 DIAGNOSIS — G92 Toxic encephalopathy: Secondary | ICD-10-CM | POA: Diagnosis present

## 2017-11-26 LAB — BASIC METABOLIC PANEL
ANION GAP: 13 (ref 5–15)
BUN: 18 mg/dL (ref 6–20)
CO2: 24 mmol/L (ref 22–32)
Calcium: 8.6 mg/dL — ABNORMAL LOW (ref 8.9–10.3)
Chloride: 98 mmol/L — ABNORMAL LOW (ref 101–111)
Creatinine, Ser: 5.9 mg/dL — ABNORMAL HIGH (ref 0.61–1.24)
GFR, EST AFRICAN AMERICAN: 10 mL/min — AB (ref >60)
GFR, EST NON AFRICAN AMERICAN: 9 mL/min — AB (ref >60)
Glucose, Bld: 84 mg/dL (ref 65–99)
Potassium: 3.9 mmol/L (ref 3.5–5.1)
Sodium: 135 mmol/L (ref 135–145)

## 2017-11-26 LAB — CBC
HCT: 37 % — ABNORMAL LOW (ref 39.0–52.0)
Hemoglobin: 12.2 g/dL — ABNORMAL LOW (ref 13.0–17.0)
MCH: 29.5 pg (ref 26.0–34.0)
MCHC: 33 g/dL (ref 30.0–36.0)
MCV: 89.4 fL (ref 78.0–100.0)
PLATELETS: 181 10*3/uL (ref 150–400)
RBC: 4.14 MIL/uL — ABNORMAL LOW (ref 4.22–5.81)
RDW: 16.8 % — AB (ref 11.5–15.5)
WBC: 6.1 10*3/uL (ref 4.0–10.5)

## 2017-11-26 LAB — GLUCOSE, CAPILLARY
GLUCOSE-CAPILLARY: 85 mg/dL (ref 65–99)
GLUCOSE-CAPILLARY: 86 mg/dL (ref 65–99)
GLUCOSE-CAPILLARY: 87 mg/dL (ref 65–99)
Glucose-Capillary: 77 mg/dL (ref 65–99)
Glucose-Capillary: 87 mg/dL (ref 65–99)
Glucose-Capillary: 87 mg/dL (ref 65–99)

## 2017-11-26 LAB — PROTIME-INR
INR: 1.21
Prothrombin Time: 15.2 seconds (ref 11.4–15.2)

## 2017-11-26 LAB — CULTURE, BLOOD (SINGLE)
CULTURE: NO GROWTH
Special Requests: ADEQUATE

## 2017-11-26 MED ORDER — WARFARIN SODIUM 2.5 MG PO TABS: 2.5000 mg | ORAL_TABLET | Freq: Once | ORAL | Status: DC

## 2017-11-26 MED ORDER — PIPERACILLIN-TAZOBACTAM 3.375 G IVPB
3.3750 g | Freq: Two times a day (BID) | INTRAVENOUS | Status: AC
  Administered 2017-11-26 – 2017-11-28 (×6): 3.375 g via INTRAVENOUS
  Filled 2017-11-26 (×6): qty 50

## 2017-11-26 MED ORDER — HEPARIN (PORCINE) IN NACL 100-0.45 UNIT/ML-% IJ SOLN
1450.0000 [IU]/h | INTRAMUSCULAR | Status: DC
  Administered 2017-11-26: 1450 [IU]/h via INTRAVENOUS
  Filled 2017-11-26 (×2): qty 250

## 2017-11-26 NOTE — Progress Notes (Addendum)
[facility] Progress Note  Subjective:  Afebrile. Sleeping, wife says some improvement LP done, csf neg to date   Vitals:   11/26/17 0416 11/26/17 0418 11/26/17 0542 11/26/17 0818  BP:  (!) 156/58  (!) 150/50  Pulse: 82 85  80  Resp: 18 (!) 27  18  Temp: 97.9 F (36.6 C)   98.2 F (36.8 C)  TempSrc: Axillary   Axillary  SpO2: 91% 94%  96%  Weight:   92.1 kg (203 lb 0.7 oz)     Inpatient medications: . amiodarone  200 mg Oral Daily  . atorvastatin  40 mg Oral QHS  . insulin aspart  2-6 Units Subcutaneous Q4H  . latanoprost  1 drop Both Eyes QHS  . LORazepam  1 mg Intravenous Once  . mouth rinse  15 mL Mouth Rinse BID  . midodrine  5 mg Oral BID WC  . QUEtiapine  12.5 mg Oral QHS  . sodium chloride flush  3 mL Intravenous Q12H  . warfarin  2.5 mg Oral ONCE-1800  . Warfarin - Pharmacist Dosing Inpatient   Does not apply q1800   . sodium chloride    . famotidine (PEPCID) IV Stopped (11/26/17 1149)  . meropenem (MERREM) IV Stopped (11/26/17 0207)  . vancomycin Stopped (11/25/17 1731)   sodium chloride, acetaminophen **OR** acetaminophen, albuterol, ondansetron (ZOFRAN) IV  Exam: Chron ill arousable and follows simple commands Mild spont myoclonus No jvd Chest cta bilat Cor reg no gallop abd obese soft ntnd bilat bka no edema Left chest tdc no drainage  [facility]  91.5kg  2/2.5 bath p4  Hep 4000+ 2000 midrun  Left chest tdc Venofer 50mg  IV q week      Impression: 1 high fevers w ams- remains confused, csf neg to date,  neuro following  2 esrd hd tts - using tunneled hd cath, blood cx's negative 3 hypotension chronic on midodrine 4 anemia ckd hb up no esa need 5 afib on coumadin amio 6 dm2 per primary 7 mbd ckd no chg phoslo 8 hx cva and/or seizures - started on keppra here, mri head negative  Plan - HD tomorrow on schedule, minimal UF  Lynnda Child PA-C [place] Pager [phone] 11/26/2017,1:04 PM  Pt  seen, examined and agree w A/P as above.  Kelly Splinter MD [facility] pager [phone]   11/26/2017, 1:40 PM     Recent Labs  Lab 11/22/17 0612 11/23/17 0400 11/24/17 0511 11/26/17 0406  NA 137 140 136 135  K 4.6 4.8 3.9 3.9  CL 105 105 96* 98*  CO2 22 19* 24 24  GLUCOSE 136* 96 78 84  BUN 51* 59* 29* 18  CREATININE 10.40* 11.32* 7.27* 5.90*  CALCIUM 9.2 9.1 8.7* 8.6*  PHOS 2.9 4.2 3.2  --    Recent Labs  Lab 11/21/17 1850 11/22/17 0612  AST 35 36  ALT 16* 13*  ALKPHOS 56 41  BILITOT 0.6 0.7  PROT 8.7* 6.9  ALBUMIN 3.8 3.0*   Recent Labs  Lab 11/21/17 1850  11/24/17 0511 11/25/17 0837 11/26/17 0406  WBC 6.5   < > 5.8 5.7 6.1  NEUTROABS 5.3  --   --  3.8  --   HGB 14.7   < > 11.2* 11.0* 12.2*  HCT 45.8   < > 34.2* 33.4* 37.0*  MCV 93.5   < > 90.0 89.3 89.4  PLT 173   < > 171 192 181   < > =  values in this interval not displayed.   Iron/TIBC/Ferritin/ %Sat    Component Value Date/Time   IRON 50 10/01/2016 0419   TIBC 167 (L) 10/01/2016 0419   FERRITIN 351 (H) 10/01/2016 0419   IRONPCTSAT 30 10/01/2016 0419

## 2017-11-26 NOTE — Progress Notes (Signed)
  Speech Language Pathology Treatment: Dysphagia  Patient Details Name: Oscar Castillo MRN: [id number] DOB: [birth year] Today's Date: 11/26/2017 Time: 9702-6378 SLP Time Calculation (min) (ACUTE ONLY): 28 min  Assessment / Plan / Recommendation Clinical Impression  Today pt more alert per wife - he did open his eyes x2 during session and appropriately stated "Bye" to this SLP.  Of Pt's wife reports he was diagnosed with early stage dementia a year ago and had a similar episode of longer term AMS  in 2015 due to sepsis -where he required a PeG and a rehab stay of several months.    Today pt required total multimodal cues to stay alert - and sealed his lips tightly closed when SlP offered juice *his favorite grape* and ice chips.  SLP was able to get small amount of liquids via end of straw into mouth and noted pt with delayed swallow.  No indications of airway compromise or wet voice with minimal intake observed.  Suspect when his mentation improves, swallow with be adequate for modified diet.  Concern for nutrition present at this time however given pt without intake since Sunday.    Advised wife to SLP plan to ask RN to page our group if pt willing to accept po intake on 5/3 or 5/4.  Concern present given incident approximately 4 years ago.  Would recommend to consider palliative consult to help family establish goals for this pt with multiple medical issues.      HPI HPI: Pt is a 67 y.o m with ESRD on HD, Essential HTN, HCV, Afib on coumadin, Diabetes mellitus, Bipolar disorder, hx of renal stones, HFrEF, prior CVA who presented with acute encephalopathy, dysarthria, right facial droop, fatigue, and in septic shock. CT Head showed atrophy most prominent at the brainstem but no acute changes. MRI pending.      SLP Plan  Continue with current plan of care       Recommendations  Medication Administration: Crushed with puree                Oral Care Recommendations: Oral care QID Follow  up Recommendations: (tba) SLP Visit Diagnosis: Dysphagia, unspecified (R13.10) Plan: Continue with current plan of care       Oscar Castillo, Oscar Castillo Ann 11/26/2017, 9:06 AM   Oscar Castillo, Oscar Castillo [facility] SLP [phone]

## 2017-11-26 NOTE — Progress Notes (Signed)
PROGRESS NOTE    Patient: Oscar Castillo     PCP: [name], MD                    DOB: June 19, 1951            DOA: 11/21/2017 SNK:[id number]             DOS: 11/26/2017, 3:21 PM   LOS: 5 days   Date of Service: The patient was seen and examined on 11/26/2017  Subjective:   Patient was seen and examined this morning still encephalopathic, but improved since yesterday, was able to open his eyes, repeat his name.  Not following any specific commands No issues overnight MRI for this morning  Present at bedside Status post successful LP Neurology, infectious disease, nephrology team following following  ----------------------------------------------------------------------------------------------------------------------  Brief Narrative:  67 y.o m with ESRD on HD TTS, Essential HTN, HCV, Afib on coumadin, complete heart block s/p ppm, bilateral BKA, Diabetes mellitus, Bipolar disorder, hx of renal stones, HFrEFtakes midodrine at home, prior CVA who presented with acute encephalopathy, dysarthria, right facial droop, fatigue, and in septic shock. Patient had received only 3hrs af his last dialysis session rather than the usual 4hrs he usually gets.   Active Problems:   Severe sepsis (HCC)   Septic shock (HCC)   Acute encephalopathy   Pulmonary edema   Assessment & Plan: Toxic metabolic encephalopathy -Underlining causing still unclear -Possibly infection/sepsis but no source was identified, -Per neurology the possible culprit might be acyclovir which has been DC'd -MRI of the brain was reviewed no acute findings identified chronic atrophy and microvascular changes was noted -Improved mentation as he can state his name, still sleepy, does not follow all commands -Vitals are stable -Per neurology EEG was also inconclusive, anticipating continuous EEG   Severe sepsis/septic shock -Unknown source or etiology, currently stable afebrile, normotensive -Status post LP, thus far  inconclusive, no signs of meningitis or infection -Acyclovir was DC'd as it may be contributing to encephalopathy -Cultures are negative to date -In the face of elevated procalcitonin level, early signs of sepsis septic shock infectious disease team recommended to continue antibiotics to complete a total of 8 days of coverage  -ID recommending: Vancomycin/Zosyn for 3 more days  -Improved WBC from 11.2-->7.9 -->5.7--> 6.1 -HIV nonreactive   Hypotension secondary to above -Blood pressure stabilized improved after fluid resuscitation -Holding blood pressure medications -Status post treatment with neo-epinephrine, subsequently tapered down -Pressure currently stable  Pulmonary edema -Stable on O2 via nasal cannula -  History of atrial fibrillation/congestive heart failure -Continue cardiac monitoring -Daily weights -Monitoring I's and O's -On hemodialysis -Status post LP now will resume Coumadin pharmacy will be consulted dose -N.p.o. meds including amiodarone, pending p.o. speech evaluation  End stage renal disease  -On hemodialysis Tuesday Thursdays and Saturdays -Nephrology following -Scheduled hemodialysis  Dysphagia -Currently n.p.o., also altered mental status -Stable patient will be evaluated by speech -Continue IV amantadine  Diabetes mellitus II  -Checking blood sugars every 6 hours, as he is n.p.o. SSI  History of CVA versus seizures -Patient has been initiated on Keppra 500 mg twice daily -Precaution -Monitoring INR, holding Coumadin in anticipation of possible LP per neurology -MRI of the head negative for any intracranial abnormalities -EEG showing diffuse slowing -Ammonia level 29   CODE STATUS full code  Family wife present at bedside updated   Consultants:   PCCM  Nephro  Neuro  ID    Antimicrobials:   11/21/2017 -  vancomycin ---> till 5/5 5/3   Zosyn ---> till 5/5   4/29  acyclovir -->5/2 4/28  meropenem  -->5/3     Anti-infectives (From admission, onward)   Start     Dose/Rate Route Frequency Ordered Stop   11/26/17 1545  piperacillin-tazobactam (ZOSYN) IVPB 3.375 g     3.375 g 12.5 mL/hr over 240 Minutes Intravenous Every 12 hours 11/26/17 1501     11/23/17 1800  acyclovir (ZOVIRAX) 460 mg in dextrose 5 % 100 mL IVPB  Status:  Discontinued     460 mg 109.2 mL/hr over 60 Minutes Intravenous Every 24 hours 11/22/17 1434 11/25/17 1821   11/23/17 1413  vancomycin (VANCOCIN) 1-5 GM/200ML-% IVPB    Note to Pharmacy:  Cherylann Banas   : cabinet override      11/23/17 1413 11/23/17 1601   11/23/17 1200  vancomycin (VANCOCIN) IVPB 1000 mg/200 mL premix     1,000 mg 200 mL/hr over 60 Minutes Intravenous Every T-Th-Sa (Hemodialysis) 11/21/17 2326     11/22/17 1800  acyclovir (ZOVIRAX) 460 mg in dextrose 5 % 100 mL IVPB  Status:  Discontinued     460 mg 109.2 mL/hr over 60 Minutes Intravenous Every 24 hours 11/22/17 1416 11/22/17 1434   11/22/17 1445  acyclovir (ZOVIRAX) 460 mg in dextrose 5 % 100 mL IVPB     460 mg 109.2 mL/hr over 60 Minutes Intravenous  Once 11/22/17 1434 11/22/17 1604   11/21/17 2300  meropenem (MERREM) 500 mg in sodium chloride 0.9 % 100 mL IVPB  Status:  Discontinued     500 mg 200 mL/hr over 30 Minutes Intravenous Every 24 hours 11/21/17 2226 11/26/17 1436   11/21/17 2300  vancomycin (VANCOCIN) 2,000 mg in sodium chloride 0.9 % 500 mL IVPB     2,000 mg 250 mL/hr over 120 Minutes Intravenous  Once 11/21/17 2249 11/22/17 0157       Objective: Vitals:   11/26/17 0542 11/26/17 0818 11/26/17 1308 11/26/17 1309  BP:  (!) 150/50 (!) 152/77   Pulse:  80 79   Resp:  18 19   Temp:  98.2 F (36.8 C)  98 F (36.7 C)  TempSrc:  Axillary  Axillary  SpO2:  96% 97%   Weight: 92.1 kg (203 lb 0.7 oz)       Intake/Output Summary (Last 24 hours) at 11/26/2017 1521 Last data filed at 11/26/2017 1149 Gross per 24 hour  Intake 0 ml  Output 1000 ml  Net -1000 ml   Filed Weights    11/23/17 1715 11/25/17 1724 11/26/17 0542  Weight: 92.3 kg (203 lb 7.8 oz) 92.5 kg (203 lb 14.8 oz) 92.1 kg (203 lb 0.7 oz)    Examination:  BP (!) 152/77   Pulse 79   Temp 98 F (36.7 C) (Axillary)   Resp 19   Wt 92.1 kg (203 lb 0.7 oz)   SpO2 97%   BMI 29.13 kg/m    Physical Exam  Constitution: Lethargic   HEENT: Normocephalic, PERRL, otherwise with in Normal limits  Cardio vascular:  S1/S2, RRR, No murmure, No Rubs or Gallops  Chest/pulmonary: Clear to auscultation bilaterally, respirations unlabored  Chest symmetric Abdomen: Soft, non-tender, non-distended, bowel sounds,no masses, no organomegaly Muscular skeletal: Limited exam -continues to be lethargic Neuro: CNII-XII intact. , normal motor and sensation, reflexes intact  Extremities: +2 pitting edema in arms and hand, bilateral BKA Skin: Dry, warm to touch,       Data Reviewed: I have personally reviewed following  labs and imaging studies  CBC: Recent Labs  Lab 11/21/17 1850 11/22/17 0612 11/23/17 0400 11/24/17 0511 11/25/17 0837 11/26/17 0406  WBC 6.5 11.2* 7.9 5.8 5.7 6.1  NEUTROABS 5.3  --   --   --  3.8  --   HGB 14.7 12.2* 11.6* 11.2* 11.0* 12.2*  HCT 45.8 38.2* 35.9* 34.2* 33.4* 37.0*  MCV 93.5 93.6 91.6 90.0 89.3 89.4  PLT 173 167 162 171 192 017   Basic Metabolic Panel: Recent Labs  Lab 11/21/17 1850 11/22/17 0612 11/23/17 0400 11/24/17 0511 11/26/17 0406  NA 135 137 140 136 135  K 5.5* 4.6 4.8 3.9 3.9  CL 94* 105 105 96* 98*  CO2 20* 22 19* 24 24  GLUCOSE 189* 136* 96 78 84  BUN 46* 51* 59* 29* 18  CREATININE 9.58* 10.40* 11.32* 7.27* 5.90*  CALCIUM 10.6* 9.2 9.1 8.7* 8.6*  MG  --  1.9 2.0 1.9  --   PHOS  --  2.9 4.2 3.2  --    GFR: Estimated Creatinine Clearance: 14 mL/min (A) (by C-G formula based on SCr of 5.9 mg/dL (H)). Liver Function Tests: Recent Labs  Lab 11/21/17 1850 11/22/17 0612  AST 35 36  ALT 16* 13*  ALKPHOS 56 41  BILITOT 0.6 0.7  PROT 8.7* 6.9   ALBUMIN 3.8 3.0*   No results for input(s): LIPASE, AMYLASE in the last 168 hours. Recent Labs  Lab 11/22/17 0612  AMMONIA 29   Coagulation Profile: Recent Labs  Lab 11/22/17 0612 11/23/17 0400 11/24/17 0511 11/25/17 0349 11/26/17 0406  INR 2.80 2.64 1.59 1.29 1.21   Cardiac Enzymes: No results for input(s): CKTOTAL, CKMB, CKMBINDEX, TROPONINI in the last 168 hours. BNP (last 3 results) No results for input(s): PROBNP in the last 8760 hours. HbA1C: No results for input(s): HGBA1C in the last 72 hours. CBG: Recent Labs  Lab 11/25/17 2005 11/26/17 0011 11/26/17 0414 11/26/17 0807 11/26/17 1220  GLUCAP 91 86 87 85 77   Sepsis Labs: Recent Labs  Lab 11/22/17 0107 11/22/17 0612 11/22/17 0852 11/22/17 1143 11/23/17 0400 11/24/17 0511  PROCALCITON  --  40.31  --   --  68.84 70.93  LATICACIDVEN 5.64* 1.5 1.0 1.0  --   --     Recent Results (from the past 240 hour(s))  Culture, blood (single)     Status: None   Collection Time: 11/21/17 10:58 PM  Result Value Ref Range Status   Specimen Description BLOOD RIGHT ANTECUBITAL  Final   Special Requests   Final    BOTTLES DRAWN AEROBIC AND ANAEROBIC Blood Culture adequate volume   Culture   Final    NO GROWTH 5 DAYS Performed at [facility], [place] [address], [place], [place] [address]    Report Status 11/26/2017 FINAL  Final  MRSA PCR Screening     Status: None   Collection Time: 11/22/17  4:28 AM  Result Value Ref Range Status   MRSA by PCR NEGATIVE NEGATIVE Final    Comment:        The GeneXpert MRSA Assay (FDA approved for NASAL specimens only), is one component of a comprehensive MRSA colonization surveillance program. It is not intended to diagnose MRSA infection nor to guide or monitor treatment for MRSA infections. Performed at [facility], [place] [address], [place], [place] [address]   Respiratory Panel by PCR     Status: None   Collection Time: 11/22/17  4:31 AM  Result Value Ref  Range Status   Adenovirus NOT DETECTED NOT DETECTED Final   Coronavirus 229E NOT DETECTED NOT DETECTED Final   Coronavirus HKU1 NOT DETECTED NOT DETECTED Final   Coronavirus NL63 NOT DETECTED NOT DETECTED Final   Coronavirus OC43 NOT DETECTED NOT DETECTED Final   Metapneumovirus NOT DETECTED NOT DETECTED Final   Rhinovirus / Enterovirus NOT DETECTED NOT DETECTED Final   Influenza A NOT DETECTED NOT DETECTED Final   Influenza B NOT DETECTED NOT DETECTED Final   Parainfluenza Virus 1 NOT DETECTED NOT DETECTED Final   Parainfluenza Virus 2 NOT DETECTED NOT DETECTED Final   Parainfluenza Virus 3 NOT DETECTED NOT DETECTED Final   Parainfluenza Virus 4 NOT DETECTED NOT DETECTED Final   Respiratory Syncytial Virus NOT DETECTED NOT DETECTED Final   Bordetella pertussis NOT DETECTED NOT DETECTED Final   Chlamydophila pneumoniae NOT DETECTED NOT DETECTED Final   Mycoplasma pneumoniae NOT DETECTED NOT DETECTED Final  Culture, blood (single)     Status: None (Preliminary result)   Collection Time: 11/22/17  6:08 AM  Result Value Ref Range Status   Specimen Description BLOOD LEFT ANTECUBITAL  Final   Special Requests   Final    BOTTLES DRAWN AEROBIC ONLY Blood Culture adequate volume   Culture   Final    NO GROWTH 4 DAYS Performed at [facility], [address] [place], [place] [address]    Report Status PENDING  Incomplete  Culture, blood (single)     Status: None (Preliminary result)   Collection Time: 11/22/17  6:18 AM  Result Value Ref Range Status   Specimen Description BLOOD LEFT HAND  Final   Special Requests   Final    BOTTLES DRAWN AEROBIC ONLY Blood Culture adequate volume   Culture   Final    NO GROWTH 4 DAYS Performed at [facility], [place] [address], [place], [place] [address]    Report Status PENDING  Incomplete  CSF culture with Stat gram stain     Status: None (Preliminary result)   Collection Time: 11/25/17 11:16 AM  Result Value Ref Range Status   Specimen  Description CSF  Final   Special Requests NONE  Final   Gram Stain   Final    CYTOSPIN SMEAR WBC PRESENT, PREDOMINANTLY MONONUCLEAR NO ORGANISMS SEEN    Culture   Final    NO GROWTH 1 DAY Performed at [facility], [place] [address], [place], [place] [address]    Report Status PENDING  Incomplete      Radiology Studies: Mr Brain 43 Contrast  Result Date: 11/26/2017 CLINICAL DATA:  Initial evaluation for progressive encephalopathy the, unclear cause. EXAM: MRI HEAD WITHOUT CONTRAST TECHNIQUE: Multiplanar, multiecho pulse sequences of the brain and surrounding structures were obtained without intravenous contrast. COMPARISON:  Previous brain MRI from 11/22/2017. FINDINGS: Brain: Generalized age-related cerebral atrophy with advanced chronic microvascular ischemic disease, stable. Multiple small remote lacunar infarcts present within the bilateral thalami and pons. Chronic brainstem atrophy again noted. No abnormal foci of restricted diffusion to suggest acute or subacute ischemia. Gray-white matter differentiation maintained. No areas of remote cortical infarction. No acute intracranial hemorrhage. Multiple chronic micro hemorrhages present within the bilateral thalami, consistent with chronic small vessel ischemic changes and/or hypertension. No mass lesion, midline shift or mass effect. Diffuse ventricular prominence related global parenchymal volume loss without hydrocephalus. No extra-axial fluid collection. Pituitary gland suprasellar region within normal limits. Midline structures intact and normal. Vascular: Major intracranial vascular flow voids are maintained.  Skull and upper cervical spine: Craniocervical junction within normal limits. Degenerative spondylolysis noted at C3-4 and C4-5 without significant stenosis. Bone marrow signal intensity stable and within normal limits. No scalp soft tissue abnormality. Sinuses/Orbits: Globes and orbital soft tissues are stable and within normal  limits. Patient status post lens extraction bilaterally. Paranasal sinuses remain largely clear. Other: Left mastoid effusion again noted, slightly progressed from previous. Small right mastoid effusion now evident as well. Nasopharynx remains clear. IMPRESSION: 1. Stable appearance of the brain with no acute intracranial abnormality identified. 2. Chronic atrophy with advanced chronic small vessel ischemic disease within the thalami and pons with associated chronic brainstem atrophy. 3. Slightly progressive left mastoid effusion with new small right mastoid effusion. Electronically Signed   By: Jeannine Boga M.D.   On: 11/26/2017 13:27    Scheduled Meds: . amiodarone  200 mg Oral Daily  . atorvastatin  40 mg Oral QHS  . insulin aspart  2-6 Units Subcutaneous Q4H  . latanoprost  1 drop Both Eyes QHS  . LORazepam  1 mg Intravenous Once  . mouth rinse  15 mL Mouth Rinse BID  . midodrine  5 mg Oral BID WC  . QUEtiapine  12.5 mg Oral QHS  . sodium chloride flush  3 mL Intravenous Q12H  . warfarin  2.5 mg Oral ONCE-1800  . Warfarin - Pharmacist Dosing Inpatient   Does not apply q1800   Continuous Infusions: . sodium chloride    . famotidine (PEPCID) IV Stopped (11/26/17 1149)  . piperacillin-tazobactam (ZOSYN)  IV    . vancomycin Stopped (11/25/17 1731)    Time spent: >35 minutes  Deatra James, MD [facility],  Pager [phone]  If 7PM-7AM, please contact night-coverage [email or web address]   Password [email or web address]  11/26/2017, 3:21 PM

## 2017-11-26 NOTE — Progress Notes (Addendum)
Subjective: Patient is more alert today.  Still very confused.  He is perseverating on what I am asking him.  If I say show me her thumb he perseverates and continues to say "show me her thumb".  He follows no commands.  At the end of my encounter he eventually was able to state his name and his wife's name but then would perseverate on his name.  There is no focal or clinical seizure activity.  He did tend to stare forward and favor the left.  Given multiple attempts he would start to focus on the right.  Exam: Vitals:   11/26/17 0418 11/26/17 0818  BP: (!) 156/58 (!) 150/50  Pulse: 85 80  Resp: (!) 27 18  Temp:  98.2 F (36.8 C)  SpO2: 94% 96%    Physical Exam   HEENT-  Normocephalic, no lesions, without obvious abnormality.  Normal external eye and conjunctiva.   Extremities- Warm, dry and intact Musculoskeletal-no joint tenderness, deformity or swelling Skin-warm and dry, no hyperpigmentation, vitiligo, or suspicious lesions    Neuro:  Mental Status: Patient is as stated above alert, very encephalopathic, unable to tell me where he is, month and not until the very end was able to name his wife and his own name.  He is perseverating on what is asked of him and his name. Cranial Nerves: II: Blinks to threat bilaterally III,IV, VI: Ptosis noted on the left, extra-ocular motions intact bilaterally pupils equal, round, reactive to light and accommodation V,VII: Left facial droop, sensation appears to be intact VIII: hearing normal bilaterally  Motor: Moving all extremities antigravity Sensory: Withdraws to noxious stimuli Deep Tendon Reflexes: 2+ and symmetric throughout     Medications:  Scheduled: . amiodarone  200 mg Oral Daily  . atorvastatin  40 mg Oral QHS  . insulin aspart  2-6 Units Subcutaneous Q4H  . latanoprost  1 drop Both Eyes QHS  . LORazepam  1 mg Intravenous Once  . mouth rinse  15 mL Mouth Rinse BID  . midodrine  5 mg Oral BID WC  . QUEtiapine  12.5 mg  Oral QHS  . sodium chloride flush  3 mL Intravenous Q12H  . Warfarin - Pharmacist Dosing Inpatient   Does not apply q1800   Continuous: . sodium chloride    . famotidine (PEPCID) IV 20 mg (11/26/17 0944)  . meropenem (MERREM) IV Stopped (11/26/17 0207)  . vancomycin Stopped (11/25/17 1731)    Pertinent Labs/Diagnostics: Spinal fluid: Results for Oscar Castillo (MRN [id number]) as of 11/26/2017 09:49  Ref. Range 10/02/2013 10:55 11/25/2017 11:16  Appearance, CSF Latest Ref Range: CLEAR  CLEAR CLEAR  Glucose, CSF Latest Ref Range: 40 - 70 mg/dL 156 (H) 65  RBC Count, CSF Latest Ref Range: 0 /cu mm 7 (H) 52 (H)  Segmented Neutrophils-CSF Latest Ref Range: 0 - 6 % TOO FEW TO COUNT,...   Lymphs, CSF Latest Ref Range: 40 - 80 % RARE   Monocyte-Macrophage-Spinal Fluid Latest Ref Range: 15 - 45 % RARE   Other Cells, CSF Unknown  TOO FEW TO COUNT,...  Color, CSF Latest Ref Range: COLORLESS  COLORLESS COLORLESS  Supernatant Unknown NOT INDICATED NOT INDICATED  Total  Protein, CSF Latest Ref Range: 15 - 45 mg/dL 35 31  Tube # Unknown 1 3  WBC, CSF Latest Ref Range: 0 - 5 /cu mm 1 0  Cryptococcal antigen negative CSF culture with Gram stain negative  Preliminary blood culture showed no growth in 3 days final  report pending White blood cell serum count 6.1  MRI of brain pending  EEG- EEG Abnormalities: 1) generalized irregular slow activity 2) triphasic waves  Clinical Interpretation: This EEG is consistent with a generalized nonspecific cerebral dysfunction (encephalopathy).   There was no seizure or seizure predisposition recorded on this study. Please note that a normal EEG does not preclude the possibility of epilepsy.       Etta Quill PA-C [facility] [phone]  Impression: 67 year old male with progressive encephalopathy after presentation most consistent with sepsis.    His exam initially improved and then subsequently worsened which I think does raise the  possibility of antibiotic associated encephalopathy. Of note, acyclovir can be associated with encephalopathy and I have very low suspicion for herpes  And therefore this is been stopped.  He continues with broad-spectrum antibiotics per infectious disease.  Recommendations: 1) MRI brain 2) heparin pending theraputic warfarin  Roland Rack, MD [facility] [phone]  If 7pm- 7am, please page neurology on call as listed in [place].    11/26/2017, 9:41 AM

## 2017-11-26 NOTE — Progress Notes (Addendum)
INFECTIOUS DISEASE PROGRESS NOTE  ID: Oscar Castillo is a 67 y.o. male with  Active Problems:   Severe sepsis ([place])   Septic shock ([place])   Acute encephalopathy   Pulmonary edema  Subjective: Answers to voice. 1-2 words.  Does not completely awaken.   Abtx:  Anti-infectives (From admission, onward)   Start     Dose/Rate Route Frequency Ordered Stop   11/23/17 1800  acyclovir (ZOVIRAX) 460 mg in dextrose 5 % 100 mL IVPB  Status:  Discontinued     460 mg 109.2 mL/hr over 60 Minutes Intravenous Every 24 hours 11/22/17 1434 11/25/17 1821   11/23/17 1413  vancomycin (VANCOCIN) 1-5 GM/200ML-% IVPB    Note to Pharmacy:  Cherylann Banas   : cabinet override      11/23/17 1413 11/23/17 1601   11/23/17 1200  vancomycin (VANCOCIN) IVPB 1000 mg/200 mL premix     1,000 mg 200 mL/hr over 60 Minutes Intravenous Every T-Th-Sa (Hemodialysis) 11/21/17 2326     11/22/17 1800  acyclovir (ZOVIRAX) 460 mg in dextrose 5 % 100 mL IVPB  Status:  Discontinued     460 mg 109.2 mL/hr over 60 Minutes Intravenous Every 24 hours 11/22/17 1416 11/22/17 1434   11/22/17 1445  acyclovir (ZOVIRAX) 460 mg in dextrose 5 % 100 mL IVPB     460 mg 109.2 mL/hr over 60 Minutes Intravenous  Once 11/22/17 1434 11/22/17 1604   11/21/17 2300  meropenem (MERREM) 500 mg in sodium chloride 0.9 % 100 mL IVPB     500 mg 200 mL/hr over 30 Minutes Intravenous Every 24 hours 11/21/17 2226     11/21/17 2300  vancomycin (VANCOCIN) 2,000 mg in sodium chloride 0.9 % 500 mL IVPB     2,000 mg 250 mL/hr over 120 Minutes Intravenous  Once 11/21/17 2249 11/22/17 0157      Medications:  Scheduled: . amiodarone  200 mg Oral Daily  . atorvastatin  40 mg Oral QHS  . insulin aspart  2-6 Units Subcutaneous Q4H  . latanoprost  1 drop Both Eyes QHS  . LORazepam  1 mg Intravenous Once  . mouth rinse  15 mL Mouth Rinse BID  . midodrine  5 mg Oral BID WC  . QUEtiapine  12.5 mg Oral QHS  . sodium chloride flush  3 mL Intravenous Q12H  .  warfarin  2.5 mg Oral ONCE-1800  . Warfarin - Pharmacist Dosing Inpatient   Does not apply q1800    Objective: Vital signs in last 24 hours: Temp:  [97.7 F (36.5 C)-98.3 F (36.8 C)] 98.2 F (36.8 C) (05/03 0818) Pulse Rate:  [74-87] 80 (05/03 0818) Resp:  [10-27] 18 (05/03 0818) BP: (150-197)/(30-88) 150/50 (05/03 0818) SpO2:  [91 %-100 %] 96 % (05/03 0818) Weight:  [92.1 kg (203 lb 0.7 oz)-92.5 kg (203 lb 14.8 oz)] 92.1 kg (203 lb 0.7 oz) (05/03 0542)   General appearance: fatigued and syndromic appearance - encephalopathic Resp: clear to auscultation bilaterally Cardio: regular rate and rhythm GI: normal findings: bowel sounds normal and soft, non-tender  Lab Results Recent Labs    11/24/17 0511 11/25/17 0837 11/26/17 0406  WBC 5.8 5.7 6.1  HGB 11.2* 11.0* 12.2*  HCT 34.2* 33.4* 37.0*  NA 136  --  135  K 3.9  --  3.9  CL 96*  --  98*  CO2 24  --  24  BUN 29*  --  18  CREATININE 7.27*  --  5.90*   Liver  Panel No results for input(s): PROT, ALBUMIN, AST, ALT, ALKPHOS, BILITOT, BILIDIR, IBILI in the last 72 hours. Sedimentation Rate No results for input(s): ESRSEDRATE in the last 72 hours. C-Reactive Protein No results for input(s): CRP in the last 72 hours.  Microbiology: Recent Results (from the past 240 hour(s))  Culture, blood (single)     Status: None (Preliminary result)   Collection Time: 11/21/17 10:58 PM  Result Value Ref Range Status   Specimen Description BLOOD RIGHT ANTECUBITAL  Final   Special Requests   Final    BOTTLES DRAWN AEROBIC AND ANAEROBIC Blood Culture adequate volume   Culture   Final    NO GROWTH 4 DAYS Performed at [facility], [address], [place], [place] [address]    Report Status PENDING  Incomplete  MRSA PCR Screening     Status: None   Collection Time: 11/22/17  4:28 AM  Result Value Ref Range Status   MRSA by PCR NEGATIVE NEGATIVE Final    Comment:        The GeneXpert MRSA Assay (FDA approved for NASAL  specimens only), is one component of a comprehensive MRSA colonization surveillance program. It is not intended to diagnose MRSA infection nor to guide or monitor treatment for MRSA infections. Performed at [facility], [place] [address] [place], [place] [address]   Respiratory Panel by PCR     Status: None   Collection Time: 11/22/17  4:31 AM  Result Value Ref Range Status   Adenovirus NOT DETECTED NOT DETECTED Final   Coronavirus 229E NOT DETECTED NOT DETECTED Final   Coronavirus HKU1 NOT DETECTED NOT DETECTED Final   Coronavirus NL63 NOT DETECTED NOT DETECTED Final   Coronavirus OC43 NOT DETECTED NOT DETECTED Final   Metapneumovirus NOT DETECTED NOT DETECTED Final   Rhinovirus / Enterovirus NOT DETECTED NOT DETECTED Final   Influenza A NOT DETECTED NOT DETECTED Final   Influenza B NOT DETECTED NOT DETECTED Final   Parainfluenza Virus 1 NOT DETECTED NOT DETECTED Final   Parainfluenza Virus 2 NOT DETECTED NOT DETECTED Final   Parainfluenza Virus 3 NOT DETECTED NOT DETECTED Final   Parainfluenza Virus 4 NOT DETECTED NOT DETECTED Final   Respiratory Syncytial Virus NOT DETECTED NOT DETECTED Final   Bordetella pertussis NOT DETECTED NOT DETECTED Final   Chlamydophila pneumoniae NOT DETECTED NOT DETECTED Final   Mycoplasma pneumoniae NOT DETECTED NOT DETECTED Final  Culture, blood (single)     Status: None (Preliminary result)   Collection Time: 11/22/17  6:08 AM  Result Value Ref Range Status   Specimen Description BLOOD LEFT ANTECUBITAL  Final   Special Requests   Final    BOTTLES DRAWN AEROBIC ONLY Blood Culture adequate volume   Culture   Final    NO GROWTH 3 DAYS Performed at [facility], [address], [place], [place] [address]    Report Status PENDING  Incomplete  Culture, blood (single)     Status: None (Preliminary result)   Collection Time: 11/22/17  6:18 AM  Result Value Ref Range Status   Specimen Description BLOOD LEFT HAND  Final   Special  Requests   Final    BOTTLES DRAWN AEROBIC ONLY Blood Culture adequate volume   Culture   Final    NO GROWTH 3 DAYS Performed at [facility], [place] [address] [place], [place] [address]    Report Status PENDING  Incomplete  CSF culture with Stat gram stain  Status: None (Preliminary result)   Collection Time: 11/25/17 11:16 AM  Result Value Ref Range Status   Specimen Description CSF  Final   Special Requests NONE  Final   Gram Stain   Final    CYTOSPIN SMEAR WBC PRESENT, PREDOMINANTLY MONONUCLEAR NO ORGANISMS SEEN    Culture   Final    NO GROWTH 1 DAY Performed at [facility], [address], [place], [place] [address]    Report Status PENDING  Incomplete    Studies/Results: No results found.   Assessment/Plan: Encephalopthy Sepsis ESRD  Total days of antibiotics: 4-28 vanco/merrem  The etiology of his syndrome remains elusive.  His Cx are negative.  His CSF is not c/i infection.  Will restart his anbx to complete 8 days total for sepsis of ? Source. (Vanco/zosyn for 3 days).  Could his Acyclovir have caused his encephalopthy? It is now off. Have d/i neuro. Repeat HD tomorrow, will hopefully clear remainder of ACV.           Bobby Rumpf MD, FACP Infectious Diseases (pager) [phone] [email or web address] 11/26/2017, 1:12 PM  LOS: 5 days

## 2017-11-26 NOTE — Progress Notes (Signed)
[place] for Warfarin  Indication: atrial fibrillation  Allergies  Allergen Reactions  . Ceftriaxone Sodium In Dextrose Anaphylaxis  . Ciprofloxacin Other (See Comments)    UNSPECIFIED PAIN  . Nsaids Other (See Comments)    HISTORY OF ULCER  . Penicillins Swelling    SWELLING REACTION UNSPECIFIED  [ PATIENT WITH HX OF ANAPHYLAXIS TO CEFTRIAXONE ]  . Sulfa Antibiotics Swelling    SWELLING REACTION UNSPECIFIED    Patient Measurements: Weight: 203 lb 0.7 oz (92.1 kg)  Vital Signs: Temp: 98.2 F (36.8 C) (05/03 0818) Temp Source: Axillary (05/03 0818) BP: 150/50 (05/03 0818) Pulse Rate: 80 (05/03 0818)  Labs: Recent Labs    11/24/17 0511 11/25/17 0349 11/25/17 0837 11/26/17 0406  HGB 11.2*  --  11.0* 12.2*  HCT 34.2*  --  33.4* 37.0*  PLT 171  --  192 181  LABPROT 18.8* 16.0*  --  15.2  INR 1.59 1.29  --  1.21  CREATININE 7.27*  --   --  5.90*    Estimated Creatinine Clearance: 14 mL/min (A) (by C-G formula based on SCr of 5.9 mg/dL (H)).   Medical History: Past Medical History:  Diagnosis Date  . Anemia   . Antral ulcer 2014   small  . BENIGN PROSTATIC HYPERTROPHY   . Bipolar disorder ([place])    "sometimes" (10/07/2016)  . CHOLELITHIASIS   . Chronic combined systolic and diastolic CHF (congestive heart failure) ([place])   . Complication of anesthesia    wife states pt had trouble waking up with in Nov., 2014  . CVA (cerebral vascular accident) ([place]) 07/2007   No residual effect  . DEPRESSION   . DIABETES MELLITUS, TYPE II    diet control.  No medication  since November 2015  . ERECTILE DYSFUNCTION   . ESRD on hemodialysis ([place])    ESRD due to DM/HTN. .  HD TTS at [facility] on [place].  [name] GERD   . GI bleed    due to gastritis, discharged 10/02/16/notes 10/07/2016  . Hepatitis C    C - has been treated  . History of Clostridium difficile   . History of kidney stones   . HYPERTENSION   . LBBB (left bundle branch  block)   . Morbid obesity ([place])   . PAF (paroxysmal atrial fibrillation) ([place])    a. Dx 12/2015.   Assessment: 67 y/o M with dense encephalopathy in the setting of high fevers, on warfarin PTA for afib. INR was therapeutic on admission at 2.51, ESRD on HD. Warfarin was held for LP procedure and patient received 5 mg IV vitK on 4/30 to reverse INR. INR was subtherapeutic on 5/2.  LP done on 5/2.  Warfarin restarted 5/2 PM s/p LP. INR today 1.21, remains subtherapeutic after reversal with vitamin K.  Hgb low/stable, Pltc stable. No bleeding noted.  Patient has been and is currently NPO. Also on amiodarone continued from PTA.  Neurology following for progressive encephalopathy, noting suspects that this represents metabolic encephalopathy associated with his infection and also with holding his Coumadin, ischemic stroke has to be considered. MRI  ordered.   Warfarin PTA dosing: 1.25 mg on Saturdays, 2.5 mg all other days  Goal of Therapy:  INR 2-3 Monitor platelets by anticoagulation protocol: Yes   Plan:  Give warfarin 2.5 mg po x 1 Monitor daily INR, CBC, clinical course, s/sx of bleed, PO intake, DDI   Thank you for allowing [place] to participate in  this patients care. Nicole Cella, RPh Clinical Pharmacist Clinical phone for 11/26/2017 from 8:00-3:30p: [phone]  from 3:30-10:30p: x [phone] If after 10:30p, please call main pharmacy at: [phone] 11/26/2017 12:04 PM

## 2017-11-26 NOTE — Progress Notes (Addendum)
ANTICOAGULATION CONSULT NOTE - Initial Consult  Pharmacy Consult for Heparin Indication: atrial fibrillation  Allergies  Allergen Reactions  . Ceftriaxone Sodium In Dextrose Anaphylaxis  . Ciprofloxacin Other (See Comments)    UNSPECIFIED PAIN  . Nsaids Other (See Comments)    HISTORY OF ULCER  . Penicillins Swelling    SWELLING REACTION UNSPECIFIED  [ PATIENT WITH HX OF ANAPHYLAXIS TO CEFTRIAXONE ] 11/26/17:  pt has received 3 doses of zosyn in past (04/2017.) No allergy tor adverse event to zosyn noted at that time.     . Sulfa Antibiotics Swelling    SWELLING REACTION UNSPECIFIED     Patient Measurements: Weight: 203 lb 0.7 oz (92.1 kg) Heparin Dosing Weight: 91 kg  Vital Signs: Temp: 98.1 F (36.7 C) (05/03 1707) Temp Source: Axillary (05/03 1707) BP: 147/55 (05/03 1707) Pulse Rate: 81 (05/03 1707)  Labs: Recent Labs    11/24/17 0511 11/25/17 0349 11/25/17 0837 11/26/17 0406  HGB 11.2*  --  11.0* 12.2*  HCT 34.2*  --  33.4* 37.0*  PLT 171  --  192 181  LABPROT 18.8* 16.0*  --  15.2  INR 1.59 1.29  --  1.21  CREATININE 7.27*  --   --  5.90*    Estimated Creatinine Clearance: 14 mL/min (A) (by C-G formula based on SCr of 5.9 mg/dL (H)).   Medical History: Past Medical History:  Diagnosis Date  . Anemia   . Antral ulcer 2014   small  . BENIGN PROSTATIC HYPERTROPHY   . Bipolar disorder ([place])    "sometimes" (10/07/2016)  . CHOLELITHIASIS   . Chronic combined systolic and diastolic CHF (congestive heart failure) ([place])   . Complication of anesthesia    wife states pt had trouble waking up with in Nov., 2014  . CVA (cerebral vascular accident) ([place]) 07/2007   No residual effect  . DEPRESSION   . DIABETES MELLITUS, TYPE II    diet control.  No medication  since November 2015  . ERECTILE DYSFUNCTION   . ESRD on hemodialysis ([place])    ESRD due to DM/HTN. .  HD TTS at [facility] on [address].  [name] GERD   . GI bleed    due to gastritis, discharged  10/02/16/notes 10/07/2016  . Hepatitis C    C - has been treated  . History of Clostridium difficile   . History of kidney stones   . HYPERTENSION   . LBBB (left bundle branch block)   . Morbid obesity ([place])   . PAF (paroxysmal atrial fibrillation) ([place])    a. Dx 12/2015.    Assessment: 67 year old male on warfarin for history of atrial fibrillation and history CVA who received vitamin K 5 mg IV on 4/30 to reverse INR for LP done on 5/2. Warfarin was resumed 5/2. Now to start IV Heparin until INR >2.   INR today is 1.21, remains low due to vitamin K resistance. Warfarin 2.5mg  given last night and tonight. H/H 12.2/37, Platelets 181 - stable for admission.  Patient with ESRD on HD - last HD 5/2.  5/3 MRI - no hemorrhage noted.   Goal of Therapy:  INR 2-3 Heparin level 0.3-0.7 units/ml Monitor platelets by anticoagulation protocol: Yes   Plan:  Start Heparin 1450 units/hr.  Check Heparin level in 8 hours - with am labs.  CBC and Heparin level daily.  Sloan Leiter, PharmD, BCPS, BCCCP Clinical Pharmacist Clinical phone 11/26/2017 until 11PM [phone] After hours, please call [phone]  11/26/2017,8:02 PM

## 2017-11-26 NOTE — Progress Notes (Signed)
Pharmacy Antibiotic Note  WILDER KUROWSKI is a 67 y.o. male admitted on 11/21/2017 with sepsis. On Day #6 meropenem and vancomycin, for sepsis.  Acute encephalopathy, Neurologist  discontinued the Acyclovir yesterday due to acyclovir may cause encephalopathy.   ID has consulted to pharmacy to discontinue the meropenem, switch to Zosyn and continue vancomycin for severe sepsis. ?source.  WBC WNL. Afebrile >72 hours.   ESRD ON HD TTS.  Next HD tomorrow 5/4.   Plan: Continue Vancomycin 1g IV qHD (Tu/Th/Sa) next due post HD tomorrow 5/4. DC Meropenem.  Acyclovir DC'd 5/2 by neuro. Start Zosyn 3.75 g IV q12h -4 hour infusion  F/U HD schedule, clinical status, culture results Check vanco drug levels as indicated   ID plans for Vanco/zosyn for 3 days- per ID  Weight: 203 lb 0.7 oz (92.1 kg)  Temp (24hrs), Avg:98 F (36.7 C), Min:97.7 F (36.5 C), Max:98.3 F (36.8 C)  Recent Labs  Lab 11/21/17 1850 11/21/17 2307 11/22/17 0107 11/22/17 0612 11/22/17 0852 11/22/17 1143 11/23/17 0400 11/24/17 0511 11/25/17 0837 11/26/17 0406  WBC 6.5  --   --  11.2*  --   --  7.9 5.8 5.7 6.1  CREATININE 9.58*  --   --  10.40*  --   --  11.32* 7.27*  --  5.90*  LATICACIDVEN  --  2.16* 5.64* 1.5 1.0 1.0  --   --   --   --     Estimated Creatinine Clearance: 14 mL/min (A) (by C-G formula based on SCr of 5.9 mg/dL (H)).    Allergies  Allergen Reactions  . Ceftriaxone Sodium In Dextrose Anaphylaxis  . Ciprofloxacin Other (See Comments)    UNSPECIFIED PAIN  . Nsaids Other (See Comments)    HISTORY OF ULCER  . Penicillins Swelling    SWELLING REACTION UNSPECIFIED  [ PATIENT WITH HX OF ANAPHYLAXIS TO CEFTRIAXONE ]  . Sulfa Antibiotics Swelling    SWELLING REACTION UNSPECIFIED     Antimicrobials this admission:  Vancomycin 4/28>> Meropenem 4/28>>5/3 Acyclovir 4/29 >>5/2  dc'd by Neuro, 2/2 cause of encephalopathy Zosyn 5/3>>  Microbiology: 4/28 Bcx: Negative 4/29 MRSA PCR: negative 4/29 Resp  PCR: negative 4/29 BCx: NGTD x 4 days 5/2 CSF Cx: ngtd x 1 day   Thank you for allowing pharmacy to be part of this patients care team.  Nicole Cella, RPh Clinical Pharmacist Pager: [phone] 8A-3:30P [phone] 3:30P-10:30P [place] [phone] 11/26/2017 2:27 PM

## 2017-11-27 DIAGNOSIS — Z978 Presence of other specified devices: Secondary | ICD-10-CM

## 2017-11-27 DIAGNOSIS — L8990 Pressure ulcer of unspecified site, unspecified stage: Secondary | ICD-10-CM

## 2017-11-27 DIAGNOSIS — R012 Other cardiac sounds: Secondary | ICD-10-CM

## 2017-11-27 DIAGNOSIS — R509 Fever, unspecified: Secondary | ICD-10-CM

## 2017-11-27 LAB — HERPES SIMPLEX VIRUS(HSV) DNA BY PCR
HSV 1 DNA: NEGATIVE
HSV 2 DNA: NEGATIVE

## 2017-11-27 LAB — GLUCOSE, CAPILLARY
GLUCOSE-CAPILLARY: 72 mg/dL (ref 65–99)
GLUCOSE-CAPILLARY: 76 mg/dL (ref 65–99)
Glucose-Capillary: 129 mg/dL — ABNORMAL HIGH (ref 65–99)
Glucose-Capillary: 77 mg/dL (ref 65–99)
Glucose-Capillary: 80 mg/dL (ref 65–99)

## 2017-11-27 LAB — PROTIME-INR
INR: 1.44
PROTHROMBIN TIME: 17.4 s — AB (ref 11.4–15.2)

## 2017-11-27 LAB — CBC
HEMATOCRIT: 37.9 % — AB (ref 39.0–52.0)
HEMOGLOBIN: 12.4 g/dL — AB (ref 13.0–17.0)
MCH: 29.3 pg (ref 26.0–34.0)
MCHC: 32.7 g/dL (ref 30.0–36.0)
MCV: 89.6 fL (ref 78.0–100.0)
Platelets: 205 10*3/uL (ref 150–400)
RBC: 4.23 MIL/uL (ref 4.22–5.81)
RDW: 16.9 % — ABNORMAL HIGH (ref 11.5–15.5)
WBC: 6.8 10*3/uL (ref 4.0–10.5)

## 2017-11-27 LAB — BASIC METABOLIC PANEL
Anion gap: 19 — ABNORMAL HIGH (ref 5–15)
BUN: 31 mg/dL — AB (ref 6–20)
CHLORIDE: 94 mmol/L — AB (ref 101–111)
CO2: 22 mmol/L (ref 22–32)
Calcium: 8.8 mg/dL — ABNORMAL LOW (ref 8.9–10.3)
Creatinine, Ser: 7.99 mg/dL — ABNORMAL HIGH (ref 0.61–1.24)
GFR calc Af Amer: 7 mL/min — ABNORMAL LOW (ref >60)
GFR calc non Af Amer: 6 mL/min — ABNORMAL LOW (ref >60)
Glucose, Bld: 68 mg/dL (ref 65–99)
POTASSIUM: 4 mmol/L (ref 3.5–5.1)
Sodium: 135 mmol/L (ref 135–145)

## 2017-11-27 LAB — CULTURE, BLOOD (SINGLE)
CULTURE: NO GROWTH
CULTURE: NO GROWTH
Special Requests: ADEQUATE
Special Requests: ADEQUATE

## 2017-11-27 LAB — HEPARIN LEVEL (UNFRACTIONATED)
HEPARIN UNFRACTIONATED: 0.65 [IU]/mL (ref 0.30–0.70)
HEPARIN UNFRACTIONATED: 0.98 [IU]/mL — AB (ref 0.30–0.70)
Heparin Unfractionated: 0.64 IU/mL (ref 0.30–0.70)

## 2017-11-27 MED ORDER — SODIUM CHLORIDE 0.9 % IV SOLN: 100.0000 mL | INTRAVENOUS | Status: DC | PRN

## 2017-11-27 MED ORDER — HEPARIN SODIUM (PORCINE) 1000 UNIT/ML DIALYSIS: 1000.0000 [IU] | INTRAMUSCULAR | Status: DC | PRN

## 2017-11-27 MED ORDER — ALTEPLASE 2 MG IJ SOLR: 2.0000 mg | Freq: Once | INTRAMUSCULAR | Status: DC | PRN

## 2017-11-27 MED ORDER — PENTAFLUOROPROP-TETRAFLUOROETH EX AERO: 1.0000 "application " | INHALATION_SPRAY | CUTANEOUS | Status: DC | PRN

## 2017-11-27 MED ORDER — LIDOCAINE-PRILOCAINE 2.5-2.5 % EX CREA: 1.0000 "application " | TOPICAL_CREAM | CUTANEOUS | Status: DC | PRN

## 2017-11-27 MED ORDER — VANCOMYCIN HCL IN DEXTROSE 1-5 GM/200ML-% IV SOLN
INTRAVENOUS | Status: AC
  Administered 2017-11-27: 1 g
  Filled 2017-11-27: qty 200

## 2017-11-27 MED ORDER — DEXTROSE 50 % IV SOLN
INTRAVENOUS | Status: AC
  Administered 2017-11-27: 25 mL via INTRAVENOUS
  Filled 2017-11-27: qty 50

## 2017-11-27 MED ORDER — HEPARIN SODIUM (PORCINE) 1000 UNIT/ML DIALYSIS: 3000.0000 [IU] | Freq: Once | INTRAMUSCULAR | Status: DC

## 2017-11-27 MED ORDER — LIDOCAINE HCL (PF) 1 % IJ SOLN: 5.0000 mL | INTRAMUSCULAR | Status: DC | PRN

## 2017-11-27 MED ORDER — WARFARIN SODIUM 2 MG PO TABS
2.0000 mg | ORAL_TABLET | Freq: Once | ORAL | Status: DC
  Filled 2017-11-27: qty 1

## 2017-11-27 MED ORDER — DEXTROSE 50 % IV SOLN
25.0000 mL | Freq: Once | INTRAVENOUS | Status: AC
  Administered 2017-11-27: 25 mL via INTRAVENOUS

## 2017-11-27 MED ORDER — HEPARIN (PORCINE) IN NACL 100-0.45 UNIT/ML-% IJ SOLN
1000.0000 [IU]/h | INTRAMUSCULAR | Status: DC
  Administered 2017-11-28 – 2017-11-30 (×3): 1250 [IU]/h via INTRAVENOUS
  Administered 2017-12-02: 1200 [IU]/h via INTRAVENOUS
  Filled 2017-11-27 (×5): qty 250

## 2017-11-27 NOTE — Progress Notes (Signed)
[facility] Progress Note  Subjective: waking up somewhat is verbalizing w/ some difficulty    Vitals:   11/27/17 0900 11/27/17 0930 11/27/17 1000 11/27/17 1030  BP: (!) 158/55 (!) 101/28 (!) 121/54 (!) 147/46  Pulse: 79 83 82 82  Resp:      Temp:      TempSrc:      SpO2:      Weight:        Inpatient medications: . amiodarone  200 mg Oral Daily  . atorvastatin  40 mg Oral QHS  . heparin  3,000 Units Dialysis Once in dialysis  . insulin aspart  2-6 Units Subcutaneous Q4H  . latanoprost  1 drop Both Eyes QHS  . LORazepam  1 mg Intravenous Once  . mouth rinse  15 mL Mouth Rinse BID  . midodrine  5 mg Oral BID WC  . QUEtiapine  12.5 mg Oral QHS  . sodium chloride flush  3 mL Intravenous Q12H  . warfarin  2.5 mg Oral ONCE-1800  . Warfarin - Pharmacist Dosing Inpatient   Does not apply q1800   . sodium chloride    . sodium chloride    . sodium chloride    . famotidine (PEPCID) IV Stopped (11/26/17 1149)  . heparin 1,450 Units/hr (11/26/17 2046)  . piperacillin-tazobactam (ZOSYN)  IV Stopped (11/27/17 0249)  . vancomycin Stopped (11/25/17 1731)   sodium chloride, sodium chloride, sodium chloride, acetaminophen **OR** acetaminophen, albuterol, alteplase, heparin, lidocaine (PF), lidocaine-prilocaine, ondansetron (ZOFRAN) IV, pentafluoroprop-tetrafluoroeth  Exam: Chron more alert and attempting to make sentences No jvd Chest cta bilat Cor reg no gallop abd obese soft ntnd bilat bka no edema Left chest tdc no drainage  [place] [place] [facility] 4h  91.5kg  2/2.5 bath p4  Hep 4000+ 2000 midrun  Left chest tdc Venofer 50mg  IV q week      Impression: 1 high fevers w ams- fever resolved rapidly and ams starting to improve now cx's negative resp pcr neg and csf unremarkable 2 esrd hd tts - using tunneled hd cath, blood cx's negative 3 hypotension chronic on midodrine 4 anemia ckd hb up no esa need 5 afib on coumadin amio 6 dm2 per primary 7 mbd ckd no chg  phoslo 8 hx cva and/or seizures - started on keppra here, mri head negative  Plan - HD today min UF   Kelly Splinter MD [facility] pager [phone]   11/27/2017, 10:59 AM     Recent Labs  Lab 11/22/17 0612 11/23/17 0400 11/24/17 0511 11/26/17 0406 11/27/17 0501  NA 137 140 136 135 135  K 4.6 4.8 3.9 3.9 4.0  CL 105 105 96* 98* 94*  CO2 22 19* 24 24 22   GLUCOSE 136* 96 78 84 68  BUN 51* 59* 29* 18 31*  CREATININE 10.40* 11.32* 7.27* 5.90* 7.99*  CALCIUM 9.2 9.1 8.7* 8.6* 8.8*  PHOS 2.9 4.2 3.2  --   --    Recent Labs  Lab 11/21/17 1850 11/22/17 0612  AST 35 36  ALT 16* 13*  ALKPHOS 56 41  BILITOT 0.6 0.7  PROT 8.7* 6.9  ALBUMIN 3.8 3.0*   Recent Labs  Lab 11/21/17 1850  11/25/17 0837 11/26/17 0406 11/27/17 0501  WBC 6.5   < > 5.7 6.1 6.8  NEUTROABS 5.3  --  3.8  --   --   HGB 14.7   < > 11.0* 12.2* 12.4*  HCT 45.8   < > 33.4* 37.0* 37.9*  MCV 93.5   < >  89.3 89.4 89.6  PLT 173   < > 192 181 205   < > = values in this interval not displayed.   Iron/TIBC/Ferritin/ %Sat    Component Value Date/Time   IRON 50 10/01/2016 0419   TIBC 167 (L) 10/01/2016 0419   FERRITIN 351 (H) 10/01/2016 0419   IRONPCTSAT 30 10/01/2016 0419

## 2017-11-27 NOTE — Progress Notes (Signed)
Patient ID: Oscar Castillo, male   DOB: 09/19/1950, 67 y.o.   MRN: [id number]         [facility] for Infectious Disease  Date of Admission:  11/21/2017           Day 7 vancomycin        Day 2 piperacillin tazobactam ASSESSMENT: The cause of his initial fever and encephalopathy remains unclear.  Lumbar puncture and brain MRI did not reveal any acute abnormalities.  Blood cultures are negative.  He has no evidence of pneumonia or other sites of infection.  He seems to be improving slowly.  He will complete empiric antibiotic therapy tomorrow.  PLAN: 1. Stop empiric antibiotic therapy after tomorrow's doses  Principal Problem:   Toxic metabolic encephalopathy Active Problems:   Severe sepsis (HCC)   Septic shock (HCC)   Pulmonary edema   Pressure injury of skin   Scheduled Meds: . amiodarone  200 mg Oral Daily  . atorvastatin  40 mg Oral QHS  . insulin aspart  2-6 Units Subcutaneous Q4H  . latanoprost  1 drop Both Eyes QHS  . LORazepam  1 mg Intravenous Once  . mouth rinse  15 mL Mouth Rinse BID  . midodrine  5 mg Oral BID WC  . QUEtiapine  12.5 mg Oral QHS  . sodium chloride flush  3 mL Intravenous Q12H  . warfarin  2.5 mg Oral ONCE-1800  . Warfarin - Pharmacist Dosing Inpatient   Does not apply q1800   Continuous Infusions: . sodium chloride    . famotidine (PEPCID) IV Stopped (11/26/17 1149)  . heparin 1,450 Units/hr (11/26/17 2046)  . piperacillin-tazobactam (ZOSYN)  IV 3.375 g (11/27/17 1209)  . vancomycin Stopped (11/25/17 1731)   PRN Meds:.sodium chloride, acetaminophen **OR** acetaminophen, albuterol, ondansetron (ZOFRAN) IV  Review of Systems: Review of Systems  Unable to perform ROS: Mental acuity    Allergies  Allergen Reactions  . Ceftriaxone Sodium In Dextrose Anaphylaxis  . Ciprofloxacin Other (See Comments)    UNSPECIFIED PAIN  . Nsaids Other (See Comments)    HISTORY OF ULCER  . Penicillins Swelling    SWELLING REACTION UNSPECIFIED  [  PATIENT WITH HX OF ANAPHYLAXIS TO CEFTRIAXONE ] 11/26/17:  pt has received 3 doses of zosyn in past (04/2017.) No allergy tor adverse event to zosyn noted at that time.     . Sulfa Antibiotics Swelling    SWELLING REACTION UNSPECIFIED     OBJECTIVE: Vitals:   11/27/17 1000 11/27/17 1030 11/27/17 1100 11/27/17 1200  BP: (!) 121/54 (!) 147/46 (!) 164/46   Pulse: 82 82 81   Resp:      Temp:      TempSrc:      SpO2:      Weight:    195 lb 15.8 oz (88.9 kg)   Body mass index is 28.12 kg/m.  Physical Exam  Constitutional:  He is currently on hemodialysis through a left IJ catheter.  He attempts to respond to questions.  Cardiovascular: Normal rate, regular rhythm and normal heart sounds.  Very distant heart sounds.  Pulmonary/Chest: Effort normal and breath sounds normal.  Hemodialysis catheter site looks good.  Abdominal: Soft. He exhibits no distension. There is no tenderness.  Skin: No rash noted.    Lab Results Lab Results  Component Value Date   WBC 6.8 11/27/2017   HGB 12.4 (L) 11/27/2017   HCT 37.9 (L) 11/27/2017   MCV 89.6 11/27/2017   PLT 205 11/27/2017  Lab Results  Component Value Date   CREATININE 7.99 (H) 11/27/2017   BUN 31 (H) 11/27/2017   NA 135 11/27/2017   K 4.0 11/27/2017   CL 94 (L) 11/27/2017   CO2 22 11/27/2017    Lab Results  Component Value Date   ALT 13 (L) 11/22/2017   AST 36 11/22/2017   ALKPHOS 41 11/22/2017   BILITOT 0.7 11/22/2017     Microbiology: Recent Results (from the past 240 hour(s))  Culture, blood (single)     Status: None   Collection Time: 11/21/17 10:58 PM  Result Value Ref Range Status   Specimen Description BLOOD RIGHT ANTECUBITAL  Final   Special Requests   Final    BOTTLES DRAWN AEROBIC AND ANAEROBIC Blood Culture adequate volume   Culture   Final    NO GROWTH 5 DAYS Performed at [facility], [place] [address], [place], [place] [address]    Report Status 11/26/2017 FINAL  Final  MRSA PCR Screening      Status: None   Collection Time: 11/22/17  4:28 AM  Result Value Ref Range Status   MRSA by PCR NEGATIVE NEGATIVE Final    Comment:        The GeneXpert MRSA Assay (FDA approved for NASAL specimens only), is one component of a comprehensive MRSA colonization surveillance program. It is not intended to diagnose MRSA infection nor to guide or monitor treatment for MRSA infections. Performed at [facility], [place] [address] [place], [place] [address]   Respiratory Panel by PCR     Status: None   Collection Time: 11/22/17  4:31 AM  Result Value Ref Range Status   Adenovirus NOT DETECTED NOT DETECTED Final   Coronavirus 229E NOT DETECTED NOT DETECTED Final   Coronavirus HKU1 NOT DETECTED NOT DETECTED Final   Coronavirus NL63 NOT DETECTED NOT DETECTED Final   Coronavirus OC43 NOT DETECTED NOT DETECTED Final   Metapneumovirus NOT DETECTED NOT DETECTED Final   Rhinovirus / Enterovirus NOT DETECTED NOT DETECTED Final   Influenza A NOT DETECTED NOT DETECTED Final   Influenza B NOT DETECTED NOT DETECTED Final   Parainfluenza Virus 1 NOT DETECTED NOT DETECTED Final   Parainfluenza Virus 2 NOT DETECTED NOT DETECTED Final   Parainfluenza Virus 3 NOT DETECTED NOT DETECTED Final   Parainfluenza Virus 4 NOT DETECTED NOT DETECTED Final   Respiratory Syncytial Virus NOT DETECTED NOT DETECTED Final   Bordetella pertussis NOT DETECTED NOT DETECTED Final   Chlamydophila pneumoniae NOT DETECTED NOT DETECTED Final   Mycoplasma pneumoniae NOT DETECTED NOT DETECTED Final  Culture, blood (single)     Status: None   Collection Time: 11/22/17  6:08 AM  Result Value Ref Range Status   Specimen Description BLOOD LEFT ANTECUBITAL  Final   Special Requests   Final    BOTTLES DRAWN AEROBIC ONLY Blood Culture adequate volume   Culture   Final    NO GROWTH 5 DAYS Performed at [facility], [address], [place], [place] [address]    Report Status 11/27/2017 FINAL  Final  Culture, blood  (single)     Status: None   Collection Time: 11/22/17  6:18 AM  Result Value Ref Range Status   Specimen Description BLOOD LEFT HAND  Final   Special Requests   Final    BOTTLES DRAWN AEROBIC ONLY Blood Culture adequate volume   Culture   Final    NO GROWTH 5 DAYS Performed at [facility], [address] [place], [place] [address]    Report Status 11/27/2017 FINAL  Final  CSF culture with Stat gram stain     Status: None (Preliminary result)   Collection Time: 11/25/17 11:16 AM  Result Value Ref Range Status   Specimen Description CSF  Final   Special Requests NONE  Final   Gram Stain   Final    CYTOSPIN SMEAR WBC PRESENT, PREDOMINANTLY MONONUCLEAR NO ORGANISMS SEEN    Culture   Final    NO GROWTH 2 DAYS Performed at [facility], [place] [address], [place], [place] [address]    Report Status PENDING  Incomplete    Michel Bickers, MD [facility] for Infectious [place] [phone] pager   [phone] cell 11/27/2017, 12:41 PM

## 2017-11-27 NOTE — Procedures (Signed)
   I was present at this dialysis session, have reviewed the session itself and made  appropriate changes Kelly Splinter MD [place] pager [phone]   11/27/2017, 11:37 AM

## 2017-11-27 NOTE — Progress Notes (Signed)
[place] for Heparin and Warfarin Indication: atrial fibrillation  Allergies  Allergen Reactions  . Ceftriaxone Sodium In Dextrose Anaphylaxis  . Ciprofloxacin Other (See Comments)    UNSPECIFIED PAIN  . Nsaids Other (See Comments)    HISTORY OF ULCER  . Penicillins Swelling    SWELLING REACTION UNSPECIFIED  [ PATIENT WITH HX OF ANAPHYLAXIS TO CEFTRIAXONE ] 11/26/17:  pt has received 3 doses of zosyn in past (04/2017.) No allergy tor adverse event to zosyn noted at that time.     . Sulfa Antibiotics Swelling    SWELLING REACTION UNSPECIFIED     Patient Measurements: Height: 5\' 10"  (177.8 cm)(on 10/25/2017) Weight: 195 lb 15.8 oz (88.9 kg) IBW/kg (Calculated) : 73 Heparin Dosing Weight: 88.9 kg  Vital Signs: Temp: 98 F (36.7 C) (05/04 1211) Temp Source: Oral (05/04 1211) BP: 131/45 (05/04 1211) Pulse Rate: 81 (05/04 1100)  Labs: Recent Labs    11/25/17 0349  11/25/17 0837 11/26/17 0406 11/27/17 0501 11/27/17 1216  HGB  --    < > 11.0* 12.2* 12.4*  --   HCT  --   --  33.4* 37.0* 37.9*  --   PLT  --   --  192 181 205  --   LABPROT 16.0*  --   --  15.2 17.4*  --   INR 1.29  --   --  1.21 1.44  --   HEPARINUNFRC  --   --   --   --  0.65 0.98*  CREATININE  --   --   --  5.90* 7.99*  --    < > = values in this interval not displayed.    Estimated Creatinine Clearance: 10.2 mL/min (A) (by C-G formula based on SCr of 7.99 mg/dL (H)).   Medical History: Past Medical History:  Diagnosis Date  . Anemia   . Antral ulcer 2014   small  . BENIGN PROSTATIC HYPERTROPHY   . Bipolar disorder ([place])    "sometimes" (10/07/2016)  . CHOLELITHIASIS   . Chronic combined systolic and diastolic CHF (congestive heart failure) ([place])   . Complication of anesthesia    wife states pt had trouble waking up with in Nov., 2014  . CVA (cerebral vascular accident) ([place]) 07/2007   No residual effect  . DEPRESSION   . DIABETES MELLITUS, TYPE II    diet  control.  No medication  since November 2015  . ERECTILE DYSFUNCTION   . ESRD on hemodialysis ([place])    ESRD due to DM/HTN. .  HD TTS at [facility] on [address].  [name] GERD   . GI bleed    due to gastritis, discharged 10/02/16/notes 10/07/2016  . Hepatitis C    C - has been treated  . History of Clostridium difficile   . History of kidney stones   . HYPERTENSION   . LBBB (left bundle branch block)   . Morbid obesity ([place])   . PAF (paroxysmal atrial fibrillation) ([place])    a. Dx 12/2015.    Assessment: 67 year old male on warfarin for history of atrial fibrillation and history CVA who received vitamin K 5 mg IV on 4/30 to reverse INR for LP done on 5/2. Warfarin was resumed 5/2. Heparin infusion bridge until INR >2.   Heparin level is 0.98 on heparin drip 1450 units/hr.  RN reports level drawn from arm opposite of arm where heparin infusing. No bleeding noted. INR = 1.44, warfarin resumed 11/25/17.  Warfarin dose not given last night due to NPO status, pt still encephalopathic, slowly improving noted. Swallow study 5/3 recommended medication administration crushed with puree.  RN reports have been unable to give oral meds. Concern for nutrition given pt without intake since Sunday 4/28.  INR up to 1.44 after 1 dose of 2.5mg  2 days ago.   H/H low/stable , pltc wnl stable.  Patient with ESRD on HD - last HD 5/4.  5/3 MRI - no hemorrhage noted.   Goal of Therapy:  INR 2-3 Heparin level 0.3-0.7 units/ml Monitor platelets by anticoagulation protocol: Yes   Plan:  Decrease IV Heparin 1250 units/hr.  Check Heparin level in 6 hours - Warfarin 2 mg x1 if patient able to take po meds. (med administration crushed with puree per Swallow eval 5/3)  Heparin level, INR, CBC daily.   Nicole Cella, RPh Clinical Pharmacist Pager: [phone] or Main pharmacy [phone] Clinical phone 11/27/2017  until 11PM - [phone] 11/27/2017,1:30 PM

## 2017-11-27 NOTE — Progress Notes (Signed)
PROGRESS NOTE    Oscar Castillo  XFG:[id number] DOB: Oct 09, 1950 DOA: 11/21/2017 PCP: [name], MD    Brief Narrative:  67 year old male who presents with right facial droop.  He does have significant past medical history for end-stage renal disease on hemodialysis, atrial fibrillation, diastolic heart failure, history of CVA.  Patient developed right facial droop, chills and altered mentation.  On initial physical examination he was found febrile 104 F, with a septic shock physiology.  He was poorly responsive.  Patient was placed on broad-spectrum antibiotic therapy, prolonged hospital stay, the source of his sepsis continued to be not clear, suspected viral encephalitis  Assessment & Plan:   Principal Problem:   Toxic metabolic encephalopathy Active Problems:   Severe sepsis ([place])   Septic shock ([place])   Pulmonary edema   Pressure injury of skin  1.  Metabolic encephalopathy. Patient responding to simple questions, will continue neuro checks and aspiration precautions. Physical therapy as tolerated.   2.  Sepsis. Continue empiric antibiotic therapy, holding Acyclovir due to suspected toxicity, will follow on ID recommendations.  3.  Chronic atrial fibrillation. Continue rate control with amiodarone. Anticoagulation with warfarin.    4.  Type 2 diabetes mellitus. Will continue glucose cover and monitoring. Patient tolerating po well.   5.  History of CVA and seizures. No active seizures, will continue neuro checks per unit protocol.   6. ESRD on HD. Patient tolerated HD well, will continue to follow on nephrology recommendations, continue midodrine for blood pressure control   7. Depression. Will continue seroquel.    DVT prophylaxis: enoxaparin  Code Status: full Family Communication: no family at the bedside Disposition Plan: may need snf.    Consultants:   ID   Nephrology   Neurology   Procedures:    Antimicrobials:   Vancomycin   Zosyn     Subjective: Patient complains of headache, no nausea or vomiting, no chest pain or dyspnea, limited history due patients somnolence.   Objective: Vitals:   11/27/17 1000 11/27/17 1030 11/27/17 1100 11/27/17 1200  BP: (!) 121/54 (!) 147/46 (!) 164/46   Pulse: 82 82 81   Resp:      Temp:      TempSrc:      SpO2:      Weight:    88.9 kg (195 lb 15.8 oz)    Intake/Output Summary (Last 24 hours) at 11/27/2017 1218 Last data filed at 11/27/2017 0429 Gross per 24 hour  Intake 461.89 ml  Output -  Net 461.89 ml   Filed Weights   11/27/17 0622 11/27/17 0720 11/27/17 1200  Weight: 92.3 kg (203 lb 7.8 oz) 91.9 kg (202 lb 9.6 oz) 88.9 kg (195 lb 15.8 oz)    Examination:   General: deconditioned.  Neurology: Somnolent, not following commands, responding only to simple questions.  E ENT: mild pallor, no icterus, oral mucosa moist Cardiovascular: No JVD. S1-S2 present, rhythmic, no gallops, rubs, or murmurs. No lower extremity edema. Pulmonary: decreased breath sounds bilaterally at bases but adequate air movement apical zones, no wheezing, rhonchi or rales. Gastrointestinal. Abdomen with no organomegaly, non tender, no rebound or guarding Skin. No rashes Musculoskeletal: no joint deformities     Data Reviewed: I have personally reviewed following labs and imaging studies  CBC: Recent Labs  Lab 11/21/17 1850  11/23/17 0400 11/24/17 0511 11/25/17 0837 11/26/17 0406 11/27/17 0501  WBC 6.5   < > 7.9 5.8 5.7 6.1 6.8  NEUTROABS 5.3  --   --   --  3.8  --   --   HGB 14.7   < > 11.6* 11.2* 11.0* 12.2* 12.4*  HCT 45.8   < > 35.9* 34.2* 33.4* 37.0* 37.9*  MCV 93.5   < > 91.6 90.0 89.3 89.4 89.6  PLT 173   < > 162 171 192 181 205   < > = values in this interval not displayed.   Basic Metabolic Panel: Recent Labs  Lab 11/22/17 0612 11/23/17 0400 11/24/17 0511 11/26/17 0406 11/27/17 0501  NA 137 140 136 135 135  K 4.6 4.8 3.9 3.9 4.0  CL 105 105 96* 98* 94*  CO2 22 19*  24 24 22   GLUCOSE 136* 96 78 84 68  BUN 51* 59* 29* 18 31*  CREATININE 10.40* 11.32* 7.27* 5.90* 7.99*  CALCIUM 9.2 9.1 8.7* 8.6* 8.8*  MG 1.9 2.0 1.9  --   --   PHOS 2.9 4.2 3.2  --   --    GFR: Estimated Creatinine Clearance: 10.2 mL/min (A) (by C-G formula based on SCr of 7.99 mg/dL (H)). Liver Function Tests: Recent Labs  Lab 11/21/17 1850 11/22/17 0612  AST 35 36  ALT 16* 13*  ALKPHOS 56 41  BILITOT 0.6 0.7  PROT 8.7* 6.9  ALBUMIN 3.8 3.0*   No results for input(s): LIPASE, AMYLASE in the last 168 hours. Recent Labs  Lab 11/22/17 0612  AMMONIA 29   Coagulation Profile: Recent Labs  Lab 11/23/17 0400 11/24/17 0511 11/25/17 0349 11/26/17 0406 11/27/17 0501  INR 2.64 1.59 1.29 1.21 1.44   Cardiac Enzymes: No results for input(s): CKTOTAL, CKMB, CKMBINDEX, TROPONINI in the last 168 hours. BNP (last 3 results) No results for input(s): PROBNP in the last 8760 hours. HbA1C: No results for input(s): HGBA1C in the last 72 hours. CBG: Recent Labs  Lab 11/26/17 1640 11/26/17 2008 11/27/17 0013 11/27/17 0418 11/27/17 1209  GLUCAP 87 87 80 72 76   Lipid Profile: No results for input(s): CHOL, HDL, LDLCALC, TRIG, CHOLHDL, LDLDIRECT in the last 72 hours. Thyroid Function Tests: No results for input(s): TSH, T4TOTAL, FREET4, T3FREE, THYROIDAB in the last 72 hours. Anemia Panel: No results for input(s): VITAMINB12, FOLATE, FERRITIN, TIBC, IRON, RETICCTPCT in the last 72 hours.    Radiology Studies: I have reviewed all of the imaging during this hospital visit personally     Scheduled Meds: . amiodarone  200 mg Oral Daily  . atorvastatin  40 mg Oral QHS  . insulin aspart  2-6 Units Subcutaneous Q4H  . latanoprost  1 drop Both Eyes QHS  . LORazepam  1 mg Intravenous Once  . mouth rinse  15 mL Mouth Rinse BID  . midodrine  5 mg Oral BID WC  . QUEtiapine  12.5 mg Oral QHS  . sodium chloride flush  3 mL Intravenous Q12H  . warfarin  2.5 mg Oral ONCE-1800   . Warfarin - Pharmacist Dosing Inpatient   Does not apply q1800   Continuous Infusions: . sodium chloride    . famotidine (PEPCID) IV Stopped (11/26/17 1149)  . heparin 1,450 Units/hr (11/26/17 2046)  . piperacillin-tazobactam (ZOSYN)  IV 3.375 g (11/27/17 1209)  . vancomycin Stopped (11/25/17 1731)     LOS: 6 days        Mauricio Gerome Apley, MD [facility] Pager [phone]

## 2017-11-27 NOTE — Progress Notes (Signed)
Subjective: Slightly better today, currently undergoing hemodialysis  Exam: Vitals:   11/27/17 1211 11/27/17 1651  BP: (!) 131/45 133/71  Pulse:    Resp: 19 20  Temp: 98 F (36.7 C) 98.4 F (36.9 C)  SpO2: 99% 98%   Gen: In bed, NAD Resp: non-labored breathing, no acute distress Abd: soft, nt  Neuro: MS: Awake, tells me his name, tells me that he does not know where he is, no perseveration today. CN: Pupils equal round and reactive to light, extraocular movements intact Motor: He moves all extremities to command Sensory: Endorses symmetric sensation  Pertinent Labs: Creatinine 8  Impression: 68 year old male with progressive encephalopathy after presentation most consistent with sepsis.  His exam initially improved and then subsequently worsened which I think does raise the possibility of antibiotic associated encephalopathy. Of note, acyclovir can be associated with encephalopathy and I have very low suspicion for herpes  And therefore this is been stopped.  He does seem slightly better, if this trend continues then I think this will be the most likely diagnosis.  If he were to be worsening, then I would favor continuous EEG monitoring.  Recommendations: 1) neurology will continue to follow  Roland Rack, MD [facility] [phone]  If 7pm- 7am, please page neurology on call as listed in [place].

## 2017-11-27 NOTE — Progress Notes (Signed)
[facility] for Heparin  Indication: atrial fibrillation  Allergies  Allergen Reactions  . Ceftriaxone Sodium In Dextrose Anaphylaxis  . Ciprofloxacin Other (See Comments)    UNSPECIFIED PAIN  . Nsaids Other (See Comments)    HISTORY OF ULCER  . Penicillins Swelling    SWELLING REACTION UNSPECIFIED  [ PATIENT WITH HX OF ANAPHYLAXIS TO CEFTRIAXONE ] 11/26/17:  pt has received 3 doses of zosyn in past (04/2017.) No allergy tor adverse event to zosyn noted at that time.     . Sulfa Antibiotics Swelling    SWELLING REACTION UNSPECIFIED     Patient Measurements: Height: 5\' 10"  (177.8 cm)(on 10/25/2017) Weight: 195 lb 15.8 oz (88.9 kg) IBW/kg (Calculated) : 73 Heparin Dosing Weight: 88.9 kg  Vital Signs: Temp: 98.6 F (37 C) (05/04 2045) Temp Source: Oral (05/04 2045) BP: 133/71 (05/04 1651) Pulse Rate: 81 (05/04 1127)  Labs: Recent Labs    11/25/17 0349  11/25/17 0837 11/26/17 0406 11/27/17 0501 11/27/17 1216 11/27/17 2028  HGB  --    < > 11.0* 12.2* 12.4*  --   --   HCT  --   --  33.4* 37.0* 37.9*  --   --   PLT  --   --  192 181 205  --   --   LABPROT 16.0*  --   --  15.2 17.4*  --   --   INR 1.29  --   --  1.21 1.44  --   --   HEPARINUNFRC  --   --   --   --  0.65 0.98* 0.64  CREATININE  --   --   --  5.90* 7.99*  --   --    < > = values in this interval not displayed.    Estimated Creatinine Clearance: 10.2 mL/min (A) (by C-G formula based on SCr of 7.99 mg/dL (H)).   Medical History: Past Medical History:  Diagnosis Date  . Anemia   . Antral ulcer 2014   small  . BENIGN PROSTATIC HYPERTROPHY   . Bipolar disorder ([place])    "sometimes" (10/07/2016)  . CHOLELITHIASIS   . Chronic combined systolic and diastolic CHF (congestive heart failure) ([place])   . Complication of anesthesia    wife states pt had trouble waking up with in Nov., 2014  . CVA (cerebral vascular accident) ([place]) 07/2007   No residual effect  . DEPRESSION    . DIABETES MELLITUS, TYPE II    diet control.  No medication  since November 2015  . ERECTILE DYSFUNCTION   . ESRD on hemodialysis ([place])    ESRD due to DM/HTN. .  HD TTS at [facility] on [address].  [name] GERD   . GI bleed    due to gastritis, discharged 10/02/16/notes 10/07/2016  . Hepatitis C    C - has been treated  . History of Clostridium difficile   . History of kidney stones   . HYPERTENSION   . LBBB (left bundle branch block)   . Morbid obesity ([place])   . PAF (paroxysmal atrial fibrillation) ([place])    a. Dx 12/2015.    Assessment: 67 year old male on warfarin for history of atrial fibrillation and history CVA who received vitamin K 5 mg IV on 4/30 to reverse INR for LP done on 5/2. Warfarin was resumed 5/2. Heparin infusion bridge until INR >2.  -heparin level= 0.64 after decrease to 1250 units/hr  Goal of Therapy:  INR 2-3 Heparin level 0.3-0.7 units/ml Monitor platelets by anticoagulation protocol: Yes   Plan:  -No heparin changes needed -Daily heparin level and CBC  Hildred Laser, PharmD Clinical Pharmacist 11/27/2017 9:50 PM

## 2017-11-28 ENCOUNTER — Inpatient Hospital Stay (HOSPITAL_COMMUNITY): Payer: Medicare Other

## 2017-11-28 ENCOUNTER — Encounter (HOSPITAL_COMMUNITY): Payer: Self-pay

## 2017-11-28 DIAGNOSIS — R569 Unspecified convulsions: Secondary | ICD-10-CM

## 2017-11-28 LAB — CBC
HEMATOCRIT: 35.7 % — AB (ref 39.0–52.0)
Hemoglobin: 12 g/dL — ABNORMAL LOW (ref 13.0–17.0)
MCH: 30 pg (ref 26.0–34.0)
MCHC: 33.6 g/dL (ref 30.0–36.0)
MCV: 89.3 fL (ref 78.0–100.0)
Platelets: 320 10*3/uL (ref 150–400)
RBC: 4 MIL/uL — ABNORMAL LOW (ref 4.22–5.81)
RDW: 17 % — AB (ref 11.5–15.5)
WBC: 6.1 10*3/uL (ref 4.0–10.5)

## 2017-11-28 LAB — PROTIME-INR
INR: 1.66
Prothrombin Time: 19.4 seconds — ABNORMAL HIGH (ref 11.4–15.2)

## 2017-11-28 LAB — GLUCOSE, CAPILLARY
GLUCOSE-CAPILLARY: 74 mg/dL (ref 65–99)
Glucose-Capillary: 88 mg/dL (ref 65–99)

## 2017-11-28 LAB — CSF CULTURE: CULTURE: NO GROWTH

## 2017-11-28 LAB — BASIC METABOLIC PANEL
ANION GAP: 17 — AB (ref 5–15)
BUN: 15 mg/dL (ref 6–20)
CO2: 23 mmol/L (ref 22–32)
Calcium: 8.6 mg/dL — ABNORMAL LOW (ref 8.9–10.3)
Chloride: 94 mmol/L — ABNORMAL LOW (ref 101–111)
Creatinine, Ser: 5.91 mg/dL — ABNORMAL HIGH (ref 0.61–1.24)
GFR calc Af Amer: 10 mL/min — ABNORMAL LOW (ref >60)
GFR calc non Af Amer: 9 mL/min — ABNORMAL LOW (ref >60)
GLUCOSE: 67 mg/dL (ref 65–99)
POTASSIUM: 3.8 mmol/L (ref 3.5–5.1)
Sodium: 134 mmol/L — ABNORMAL LOW (ref 135–145)

## 2017-11-28 LAB — CSF CULTURE W GRAM STAIN

## 2017-11-28 LAB — AMMONIA: Ammonia: 16 umol/L (ref 9–35)

## 2017-11-28 LAB — HEPARIN LEVEL (UNFRACTIONATED): Heparin Unfractionated: 0.57 IU/mL (ref 0.30–0.70)

## 2017-11-28 LAB — VITAMIN B12: VITAMIN B 12: 843 pg/mL (ref 180–914)

## 2017-11-28 MED ORDER — WARFARIN SODIUM 2 MG PO TABS
2.0000 mg | ORAL_TABLET | Freq: Once | ORAL | Status: DC
  Filled 2017-11-28: qty 1

## 2017-11-28 MED ORDER — THIAMINE HCL 100 MG/ML IJ SOLN
500.0000 mg | Freq: Three times a day (TID) | INTRAVENOUS | Status: AC
  Administered 2017-11-28 – 2017-12-01 (×9): 500 mg via INTRAVENOUS
  Filled 2017-11-28 (×9): qty 5

## 2017-11-28 NOTE — Progress Notes (Addendum)
[facility] Progress Note   Subjective:  Seen in room, very sleepy today. Did not answer for my questions. RN reported that earlier this morning, he spoke to her and followed a few commands.  Objective Vitals:   11/28/17 0400 11/28/17 0451 11/28/17 0500 11/28/17 0753  BP:  (!) 114/44    Pulse:      Resp: 17 17 13    Temp:    98 F (36.7 C)  TempSrc:    Axillary  SpO2:      Weight:      Height:       Physical Exam General: Well appearing man, sleepy soundly. Heart: RRR; no murmur Lungs: CTA anteriorly Abdomen: soft, non-tender Extremities: B BKA without edema, but does have trace UE edema Dialysis Access: [facility]  Additional Objective Labs: Basic Metabolic Panel: Recent Labs  Lab 11/22/17 0612 11/23/17 0400 11/24/17 0511 11/26/17 0406 11/27/17 0501 11/28/17 0347  NA 137 140 136 135 135 134*  K 4.6 4.8 3.9 3.9 4.0 3.8  CL 105 105 96* 98* 94* 94*  CO2 22 19* 24 24 22 23   GLUCOSE 136* 96 78 84 68 67  BUN 51* 59* 29* 18 31* 15  CREATININE 10.40* 11.32* 7.27* 5.90* 7.99* 5.91*  CALCIUM 9.2 9.1 8.7* 8.6* 8.8* 8.6*  PHOS 2.9 4.2 3.2  --   --   --    Liver Function Tests: Recent Labs  Lab 11/21/17 1850 11/22/17 0612  AST 35 36  ALT 16* 13*  ALKPHOS 56 41  BILITOT 0.6 0.7  PROT 8.7* 6.9  ALBUMIN 3.8 3.0*   CBC: Recent Labs  Lab 11/21/17 1850  11/24/17 0511 11/25/17 0837 11/26/17 0406 11/27/17 0501 11/28/17 0347  WBC 6.5   < > 5.8 5.7 6.1 6.8 6.1  NEUTROABS 5.3  --   --  3.8  --   --   --   HGB 14.7   < > 11.2* 11.0* 12.2* 12.4* 12.0*  HCT 45.8   < > 34.2* 33.4* 37.0* 37.9* 35.7*  MCV 93.5   < > 90.0 89.3 89.4 89.6 89.3  PLT 173   < > 171 192 181 205 320   < > = values in this interval not displayed.   Blood Culture    Component Value Date/Time   SDES CSF 11/25/2017 1116   SPECREQUEST NONE 11/25/2017 1116   CULT  11/25/2017 1116    NO GROWTH 3 DAYS Performed at [facility], [place] [address], [place], [place] [address]     REPTSTATUS PENDING 11/25/2017 1116   Studies/Results: Mr Brain Wo Contrast  Result Date: 11/26/2017 CLINICAL DATA:  Initial evaluation for progressive encephalopathy the, unclear cause. EXAM: MRI HEAD WITHOUT CONTRAST TECHNIQUE: Multiplanar, multiecho pulse sequences of the brain and surrounding structures were obtained without intravenous contrast. COMPARISON:  Previous brain MRI from 11/22/2017. FINDINGS: Brain: Generalized age-related cerebral atrophy with advanced chronic microvascular ischemic disease, stable. Multiple small remote lacunar infarcts present within the bilateral thalami and pons. Chronic brainstem atrophy again noted. No abnormal foci of restricted diffusion to suggest acute or subacute ischemia. Gray-white matter differentiation maintained. No areas of remote cortical infarction. No acute intracranial hemorrhage. Multiple chronic micro hemorrhages present within the bilateral thalami, consistent with chronic small vessel ischemic changes and/or hypertension. No mass lesion, midline shift or mass effect. Diffuse ventricular prominence related global parenchymal volume loss without hydrocephalus. No extra-axial fluid collection. Pituitary gland suprasellar region within normal limits. Midline structures intact and normal. Vascular: Major intracranial  vascular flow voids are maintained. Skull and upper cervical spine: Craniocervical junction within normal limits. Degenerative spondylolysis noted at C3-4 and C4-5 without significant stenosis. Bone marrow signal intensity stable and within normal limits. No scalp soft tissue abnormality. Sinuses/Orbits: Globes and orbital soft tissues are stable and within normal limits. Patient status post lens extraction bilaterally. Paranasal sinuses remain largely clear. Other: Left mastoid effusion again noted, slightly progressed from previous. Small right mastoid effusion now evident as well. Nasopharynx remains clear. IMPRESSION: 1. Stable appearance of  the brain with no acute intracranial abnormality identified. 2. Chronic atrophy with advanced chronic small vessel ischemic disease within the thalami and pons with associated chronic brainstem atrophy. 3. Slightly progressive left mastoid effusion with new small right mastoid effusion. Electronically Signed   By: Jeannine Boga M.D.   On: 11/26/2017 13:27   Medications: . sodium chloride    . famotidine (PEPCID) IV Stopped (11/28/17 1027)  . heparin 1,250 Units/hr (11/28/17 0958)  . piperacillin-tazobactam (ZOSYN)  IV 3.375 g (11/28/17 1032)  . vancomycin Stopped (11/25/17 1731)   . amiodarone  200 mg Oral Daily  . atorvastatin  40 mg Oral QHS  . latanoprost  1 drop Both Eyes QHS  . LORazepam  1 mg Intravenous Once  . mouth rinse  15 mL Mouth Rinse BID  . midodrine  5 mg Oral BID WC  . QUEtiapine  12.5 mg Oral QHS  . sodium chloride flush  3 mL Intravenous Q12H  . warfarin  2 mg Oral ONCE-1800  . Warfarin - Pharmacist Dosing Inpatient   Does not apply q1800   Dialysis Orders: TTS at [facility] 4h  91.5kg  2/2.5 bath p4  Hep 4000+ 2000 midrun  Left chest tdc Venofer 50mg  IV q week  Impression: 1 high fevers with ams- fever resolved rapidly. Remains on Vanc/Zosyn, plan is to d/c after 5/5 per ID. Cx's negative resp pcr neg and csf unremarkable. AMS still variable, possibly improving. 2 esrd hd tts - using tunneled hd cath, blood cx's negative 3 hypotension chronic on midodrine 4 anemia ckd hb up no esa need 5 afib on coumadin amio 6 dm2 per primary 7 mbd ckd no chg phoslo 8 hx cva and/or seizures - started on keppra here, mri head negative  Plan - Next HD 5/7  Veneta Penton, PA-C 11/28/2017, 12:05 PM  [facility] Pager: [phone]  Pt seen, examined and agree w A/P as above.  Kelly Splinter MD [employer] pager [phone]   11/28/2017, 2:27 PM

## 2017-11-28 NOTE — Progress Notes (Signed)
[place] for Heparin and Warfarin  Indication: atrial fibrillation  Allergies  Allergen Reactions  . Ceftriaxone Sodium In Dextrose Anaphylaxis  . Ciprofloxacin Other (See Comments)    UNSPECIFIED PAIN  . Nsaids Other (See Comments)    HISTORY OF ULCER  . Penicillins Swelling    SWELLING REACTION UNSPECIFIED  [ PATIENT WITH HX OF ANAPHYLAXIS TO CEFTRIAXONE ] 11/26/17:  pt has received 3 doses of zosyn in past (04/2017.) No allergy tor adverse event to zosyn noted at that time.     . Sulfa Antibiotics Swelling    SWELLING REACTION UNSPECIFIED     Patient Measurements: Height: 5\' 10"  (177.8 cm)(on 10/25/2017) Weight: 195 lb 15.8 oz (88.9 kg) IBW/kg (Calculated) : 73 Heparin Dosing Weight: 88.9 kg  Vital Signs: Temp: 98 F (36.7 C) (05/05 0753) Temp Source: Axillary (05/05 0753) BP: 114/44 (05/05 0451)  Labs: Recent Labs    11/26/17 0406  11/27/17 0501 11/27/17 1216 11/27/17 2028 11/28/17 0347  HGB 12.2*  --  12.4*  --   --  12.0*  HCT 37.0*  --  37.9*  --   --  35.7*  PLT 181  --  205  --   --  320  LABPROT 15.2  --  17.4*  --   --  19.4*  INR 1.21  --  1.44  --   --  1.66  HEPARINUNFRC  --    < > 0.65 0.98* 0.64 0.57  CREATININE 5.90*  --  7.99*  --   --  5.91*   < > = values in this interval not displayed.    Estimated Creatinine Clearance: 13.8 mL/min (A) (by C-G formula based on SCr of 5.91 mg/dL (H)).   Medical History: Past Medical History:  Diagnosis Date  . Anemia   . Antral ulcer 2014   small  . BENIGN PROSTATIC HYPERTROPHY   . Bipolar disorder ([place])    "sometimes" (10/07/2016)  . CHOLELITHIASIS   . Chronic combined systolic and diastolic CHF (congestive heart failure) ([place])   . Complication of anesthesia    wife states pt had trouble waking up with in Nov., 2014  . CVA (cerebral vascular accident) ([place]) 07/2007   No residual effect  . DEPRESSION   . DIABETES MELLITUS, TYPE II    diet control.  No  medication  since November 2015  . ERECTILE DYSFUNCTION   . ESRD on hemodialysis ([place])    ESRD due to DM/HTN. .  HD TTS at [facility] [address].  [name] GERD   . GI bleed    due to gastritis, discharged 10/02/16/notes 10/07/2016  . Hepatitis C    C - has been treated  . History of Clostridium difficile   . History of kidney stones   . HYPERTENSION   . LBBB (left bundle branch block)   . Morbid obesity ([place])   . PAF (paroxysmal atrial fibrillation) ([place])    a. Dx 12/2015.    Assessment: 67 year old male on warfarin for history of atrial fibrillation and history CVA who received vitamin K 5 mg IV on 4/30 to reverse INR for LP done on 5/2. Warfarin was resumed 5/2. Heparin infusion bridge until INR >2.   Heparin level= 0.57, remains therapeutic on heparin 1250 units/hr. INR increased to 1.66 despite no coumadin given on 5/3 and 5/4, RN holding po doses due to NPO status, encephalopathy. INR increased by dose on 5/2 likely effected by no nutrition given  since admit 4/28.  H/H low stable, pltc wnl. No bleeding noted and confirmed with RN. I discussed the holding of warfarin & amiodarone, nutrition with RN who states that Dr. Cathlean Sauer is aware and calling SLP to repeat speech evaluation.  Neuro noted yesterday that patient's mental status slightly better.  Goal of Therapy:  INR 2-3 Heparin level 0.3-0.7 units/ml Monitor platelets by anticoagulation protocol: Yes   Plan:  Continue heparin 1250 units/hr. Coumadin 2 mg po today x1 if patient able to take PO. Daily heparin level, INR and CBC   Nicole Cella, RPh Clinical Pharmacist Pager: [phone]  [phone] [place] [phone] 11/28/2017 10:48 AM

## 2017-11-28 NOTE — Progress Notes (Signed)
Informed Dr. Cathlean Sauer that patient is still npo after speech evaluation. Per MD he will place cortrak orders for tromorrow so tube feedings and medications can be restarted.

## 2017-11-28 NOTE — Progress Notes (Signed)
Spoke to speech therapy regarding Patients evaluation. Informed speech that he is a little more alert but not much. Speech states that she will check on patient.

## 2017-11-28 NOTE — Progress Notes (Signed)
SLP Cancellation Note  Patient Details Name: Oscar Castillo MRN: [id number] DOB: 1951/01/04   Cancelled treatment:       Reason Eval/Treat Not Completed: Fatigue/lethargy limiting ability to participate. Pt remains lethargic, not following commands, moans briefly in response to sternal rub but does not open his eyes. Wife present; SLP educated re: aspiration risk in the setting of altered mentation. Consider alternative means of nutrition if no change in mental status. Will re-assess next date.  Deneise Lever, [place], [place] Speech-Language Pathologist Regino Ramirez 11/28/2017, 11:36 AM

## 2017-11-28 NOTE — Progress Notes (Signed)
PROGRESS NOTE    Oscar Castillo  QMV:[id number] DOB: May 18, 1951 DOA: 11/21/2017 PCP: [name], MD    Brief Narrative:  67 year old male who presents with right facial droop and altered mental status.  He does have significant past medical history for end-stage renal disease on hemodialysis, atrial fibrillation, diastolic heart failure, history of CVA.  Patient developed right facial droop, chills and altered mentation, acute presentation. About 7 days prior he was noted to have decreased po intake.  On initial physical examination he was found febrile 104 F, with a septic shock physiology.  He was poorly responsive.  Patient was placed on broad-spectrum antibiotic therapy, prolonged hospital stay, the source of his sepsis continued to be not clear, suspected viral encephalitis   Assessment & Plan:   Principal Problem:   Toxic metabolic encephalopathy Active Problems:   Severe sepsis ([place])   Septic shock ([place])   Pulmonary edema   Pressure injury of skin  1.  Metabolic encephalopathy/ suspected viral encephalitis. Patient still responding to simple questions, on neuro checks per unit protocol. Continue aspiration precautions. Patient still not able to eat by mouth, due to aspiration risk, will order NG tube placement to start tube feedings. Clinically possible viral encephalitis but CSF wbc was 0.   2.  Sepsis. No clear bacterial infection, today discontinued empiric antibiotic therapy. Continue to follow on ID recommendations.   3.  Chronic atrial fibrillation. Tolerating well amiodarone. Warfarin for anticoagulation, INR at 1,66.   4.  Type 2 diabetes mellitus. Capillary glucose 76, 77, 129, 88, 74.Will start patient on tube feedings, after NG tube placement. Continue insulin sliding scale for glucose cover and monitoring. Patient has been NPO.   5.  History of CVA and seizures. No clinical active seizures. Follow on EEG.   6. ESRD on HD.  Follow on nephrology  recommendations, regards on HD treatments, will continue midodrine for hypotension  7. Depression. On seroquel.    DVT prophylaxis: enoxaparin  Code Status: full Family Communication: no family at the bedside Disposition Plan: may need snf.    Consultants:   ID   Nephrology   Neurology   Procedures:    Antimicrobials:   Vancomycin   Zosyn    Subjective: Patient is mor reactive, but still very somnolent, no further fever, has not been able to eat due to altered mentation. Her wife is at bedside and information obtained from her.   Objective: Vitals:   11/28/17 0400 11/28/17 0451 11/28/17 0500 11/28/17 0753  BP:  (!) 114/44    Pulse:      Resp: 17 17 13    Temp:    98 F (36.7 C)  TempSrc:    Axillary  SpO2:      Weight:      Height:        Intake/Output Summary (Last 24 hours) at 11/28/2017 0958 Last data filed at 11/27/2017 1700 Gross per 24 hour  Intake 200.55 ml  Output 1000 ml  Net -799.45 ml   Filed Weights   11/27/17 0720 11/27/17 1127 11/27/17 1200  Weight: 91.9 kg (202 lb 9.6 oz) 90.8 kg (200 lb 2.8 oz) 88.9 kg (195 lb 15.8 oz)    Examination:   General: deconditioned  Neurology: somnolent, follows commands, and responds to simple questions, still confused and disorientated to place and time. Non focal.  E ENT: positive pallor, no icterus, oral mucosa moist Cardiovascular: No JVD. S1-S2 present, rhythmic, no gallops, rubs, or murmurs. No lower extremity edema. Pulmonary:  vesicular breath sounds bilaterally, adequate air movement, no wheezing, rhonchi or rales. Gastrointestinal. Abdomen protuberant with no organomegaly, non tender, no rebound or guarding Skin. No rashes Musculoskeletal: no joint deformities/ bilateral BKA     Data Reviewed: I have personally reviewed following labs and imaging studies  CBC: Recent Labs  Lab 11/21/17 1850  11/24/17 0511 11/25/17 0837 11/26/17 0406 11/27/17 0501 11/28/17 0347  WBC 6.5   < > 5.8  5.7 6.1 6.8 6.1  NEUTROABS 5.3  --   --  3.8  --   --   --   HGB 14.7   < > 11.2* 11.0* 12.2* 12.4* 12.0*  HCT 45.8   < > 34.2* 33.4* 37.0* 37.9* 35.7*  MCV 93.5   < > 90.0 89.3 89.4 89.6 89.3  PLT 173   < > 171 192 181 205 320   < > = values in this interval not displayed.   Basic Metabolic Panel: Recent Labs  Lab 11/22/17 0612 11/23/17 0400 11/24/17 0511 11/26/17 0406 11/27/17 0501 11/28/17 0347  NA 137 140 136 135 135 134*  K 4.6 4.8 3.9 3.9 4.0 3.8  CL 105 105 96* 98* 94* 94*  CO2 22 19* 24 24 22 23   GLUCOSE 136* 96 78 84 68 67  BUN 51* 59* 29* 18 31* 15  CREATININE 10.40* 11.32* 7.27* 5.90* 7.99* 5.91*  CALCIUM 9.2 9.1 8.7* 8.6* 8.8* 8.6*  MG 1.9 2.0 1.9  --   --   --   PHOS 2.9 4.2 3.2  --   --   --    GFR: Estimated Creatinine Clearance: 13.8 mL/min (A) (by C-G formula based on SCr of 5.91 mg/dL (H)). Liver Function Tests: Recent Labs  Lab 11/21/17 1850 11/22/17 0612  AST 35 36  ALT 16* 13*  ALKPHOS 56 41  BILITOT 0.6 0.7  PROT 8.7* 6.9  ALBUMIN 3.8 3.0*   No results for input(s): LIPASE, AMYLASE in the last 168 hours. Recent Labs  Lab 11/22/17 0612  AMMONIA 29   Coagulation Profile: Recent Labs  Lab 11/24/17 0511 11/25/17 0349 11/26/17 0406 11/27/17 0501 11/28/17 0347  INR 1.59 1.29 1.21 1.44 1.66   Cardiac Enzymes: No results for input(s): CKTOTAL, CKMB, CKMBINDEX, TROPONINI in the last 168 hours. BNP (last 3 results) No results for input(s): PROBNP in the last 8760 hours. HbA1C: No results for input(s): HGBA1C in the last 72 hours. CBG: Recent Labs  Lab 11/27/17 1209 11/27/17 1733 11/27/17 2044 11/28/17 0014 11/28/17 0401  GLUCAP 76 77 129* 88 74   Lipid Profile: No results for input(s): CHOL, HDL, LDLCALC, TRIG, CHOLHDL, LDLDIRECT in the last 72 hours. Thyroid Function Tests: No results for input(s): TSH, T4TOTAL, FREET4, T3FREE, THYROIDAB in the last 72 hours. Anemia Panel: No results for input(s): VITAMINB12, FOLATE,  FERRITIN, TIBC, IRON, RETICCTPCT in the last 72 hours.    Radiology Studies: I have reviewed all of the imaging during this hospital visit personally     Scheduled Meds: . amiodarone  200 mg Oral Daily  . atorvastatin  40 mg Oral QHS  . insulin aspart  2-6 Units Subcutaneous Q4H  . latanoprost  1 drop Both Eyes QHS  . LORazepam  1 mg Intravenous Once  . mouth rinse  15 mL Mouth Rinse BID  . midodrine  5 mg Oral BID WC  . QUEtiapine  12.5 mg Oral QHS  . sodium chloride flush  3 mL Intravenous Q12H  . warfarin  2 mg Oral ONCE-1800  .  Warfarin - Pharmacist Dosing Inpatient   Does not apply q1800   Continuous Infusions: . sodium chloride    . famotidine (PEPCID) IV Stopped (11/26/17 1149)  . heparin 1,250 Units/hr (11/27/17 1351)  . piperacillin-tazobactam (ZOSYN)  IV Stopped (11/28/17 0131)  . vancomycin Stopped (11/25/17 1731)     LOS: 7 days        Mauricio Gerome Apley, MD [facility] Pager [phone]

## 2017-11-28 NOTE — Progress Notes (Signed)
LTM EEG started 

## 2017-11-28 NOTE — Progress Notes (Signed)
Patient ID: Oscar Castillo, male   DOB: 29-Sep-1950, 67 y.o.   MRN: [id number]         [facility] for Infectious Disease  Date of Admission:  11/21/2017           Day 8 vancomycin        Day 3 piperacillin tazobactam ASSESSMENT: The cause of his initial fever and encephalopathy remains unclear.  As planned.  I will discontinue his empiric antibiotics after today's doses.  PLAN: 1. Stop empiric antibiotic therapy tonight 2. I will sign off now  Principal Problem:   Toxic metabolic encephalopathy Active Problems:   Severe sepsis (HCC)   Septic shock (H[facility])   Pulmonary edema   Pressure injury of skin   Scheduled Meds: . amiodarone  200 mg Oral Daily  . atorvastatin  40 mg Oral QHS  . latanoprost  1 drop Both Eyes QHS  . LORazepam  1 mg Intravenous Once  . mouth rinse  15 mL Mouth Rinse BID  . midodrine  5 mg Oral BID WC  . QUEtiapine  12.5 mg Oral QHS  . sodium chloride flush  3 mL Intravenous Q12H  . warfarin  2 mg Oral ONCE-1800  . Warfarin - Pharmacist Dosing Inpatient   Does not apply q1800   Continuous Infusions: . sodium chloride    . famotidine (PEPCID) IV Stopped (11/28/17 1027)  . heparin 1,250 Units/hr (11/28/17 0958)  . piperacillin-tazobactam (ZOSYN)  IV 3.375 g (11/28/17 1032)   PRN Meds:.sodium chloride, acetaminophen **OR** acetaminophen, albuterol, ondansetron (ZOFRAN) IV  Review of Systems: Review of Systems  Unable to perform ROS: Mental acuity    Allergies  Allergen Reactions  . Ceftriaxone Sodium In Dextrose Anaphylaxis  . Ciprofloxacin Other (See Comments)    UNSPECIFIED PAIN  . Nsaids Other (See Comments)    HISTORY OF ULCER  . Penicillins Swelling    SWELLING REACTION UNSPECIFIED  [ PATIENT WITH HX OF ANAPHYLAXIS TO CEFTRIAXONE ] 11/26/17:  pt has received 3 doses of zosyn in past (04/2017.) No allergy tor adverse event to zosyn noted at that time.     . Sulfa Antibiotics Swelling    SWELLING REACTION UNSPECIFIED      OBJECTIVE: Vitals:   11/28/17 0451 11/28/17 0500 11/28/17 0753 11/28/17 0852  BP: (!) 114/44   127/67  Pulse:    81  Resp: 17 13  12   Temp:   98 F (36.7 C) 98.3 F (36.8 C)  TempSrc:   Axillary Axillary  SpO2:    98%  Weight:      Height:       Body mass index is 28.12 kg/m.  Physical Exam  Constitutional:  He is resting quietly in bed.  His wife is at the bedside.  When I asked him how he was doing today he responded "fine".  He would not say much else.  Cardiovascular: Normal rate, regular rhythm and normal heart sounds.  Distant heart sounds.  Pulmonary/Chest: Effort normal and breath sounds normal.  Hemodialysis catheter site looks good.  Abdominal: Soft. He exhibits no distension. There is no tenderness.  Skin: No rash noted.    Lab Results Lab Results  Component Value Date   WBC 6.1 11/28/2017   HGB 12.0 (L) 11/28/2017   HCT 35.7 (L) 11/28/2017   MCV 89.3 11/28/2017   PLT 320 11/28/2017    Lab Results  Component Value Date   CREATININE 5.91 (H) 11/28/2017   BUN 15 11/28/2017   NA 134 (  L) 11/28/2017   K 3.8 11/28/2017   CL 94 (L) 11/28/2017   CO2 23 11/28/2017    Lab Results  Component Value Date   ALT 13 (L) 11/22/2017   AST 36 11/22/2017   ALKPHOS 41 11/22/2017   BILITOT 0.7 11/22/2017     Microbiology: Recent Results (from the past 240 hour(s))  Culture, blood (single)     Status: None   Collection Time: 11/21/17 10:58 PM  Result Value Ref Range Status   Specimen Description BLOOD RIGHT ANTECUBITAL  Final   Special Requests   Final    BOTTLES DRAWN AEROBIC AND ANAEROBIC Blood Culture adequate volume   Culture   Final    NO GROWTH 5 DAYS Performed at [facility], [place] [address], [place], [place] [address]    Report Status 11/26/2017 FINAL  Final  MRSA PCR Screening     Status: None   Collection Time: 11/22/17  4:28 AM  Result Value Ref Range Status   MRSA by PCR NEGATIVE NEGATIVE Final    Comment:        The GeneXpert MRSA  Assay (FDA approved for NASAL specimens only), is one component of a comprehensive MRSA colonization surveillance program. It is not intended to diagnose MRSA infection nor to guide or monitor treatment for MRSA infections. Performed at [facility], [place] [address], [place], [place] [address]   Respiratory Panel by PCR     Status: None   Collection Time: 11/22/17  4:31 AM  Result Value Ref Range Status   Adenovirus NOT DETECTED NOT DETECTED Final   Coronavirus 229E NOT DETECTED NOT DETECTED Final   Coronavirus HKU1 NOT DETECTED NOT DETECTED Final   Coronavirus NL63 NOT DETECTED NOT DETECTED Final   Coronavirus OC43 NOT DETECTED NOT DETECTED Final   Metapneumovirus NOT DETECTED NOT DETECTED Final   Rhinovirus / Enterovirus NOT DETECTED NOT DETECTED Final   Influenza A NOT DETECTED NOT DETECTED Final   Influenza B NOT DETECTED NOT DETECTED Final   Parainfluenza Virus 1 NOT DETECTED NOT DETECTED Final   Parainfluenza Virus 2 NOT DETECTED NOT DETECTED Final   Parainfluenza Virus 3 NOT DETECTED NOT DETECTED Final   Parainfluenza Virus 4 NOT DETECTED NOT DETECTED Final   Respiratory Syncytial Virus NOT DETECTED NOT DETECTED Final   Bordetella pertussis NOT DETECTED NOT DETECTED Final   Chlamydophila pneumoniae NOT DETECTED NOT DETECTED Final   Mycoplasma pneumoniae NOT DETECTED NOT DETECTED Final  Culture, blood (single)     Status: None   Collection Time: 11/22/17  6:08 AM  Result Value Ref Range Status   Specimen Description BLOOD LEFT ANTECUBITAL  Final   Special Requests   Final    BOTTLES DRAWN AEROBIC ONLY Blood Culture adequate volume   Culture   Final    NO GROWTH 5 DAYS Performed at [facility], [address], [place], [place] [address]    Report Status 11/27/2017 FINAL  Final  Culture, blood (single)     Status: None   Collection Time: 11/22/17  6:18 AM  Result Value Ref Range Status   Specimen Description BLOOD LEFT HAND  Final   Special Requests    Final    BOTTLES DRAWN AEROBIC ONLY Blood Culture adequate volume   Culture   Final    NO GROWTH 5 DAYS Performed at [facility], [place] [address], [place], [place] [address]    Report Status 11/27/2017 FINAL  Final  CSF culture with  Stat gram stain     Status: None (Preliminary result)   Collection Time: 11/25/17 11:16 AM  Result Value Ref Range Status   Specimen Description CSF  Final   Special Requests NONE  Final   Gram Stain   Final    CYTOSPIN SMEAR WBC PRESENT, PREDOMINANTLY MONONUCLEAR NO ORGANISMS SEEN    Culture   Final    NO GROWTH 3 DAYS Performed at [facility], [address] [place], [place] [address]    Report Status PENDING  Incomplete    Michel Bickers, MD [facility] [phone] pager   [phone] cell 11/28/2017, 1:26 PM

## 2017-11-28 NOTE — Progress Notes (Signed)
Subjective: Continues to wax and wane in mental status  Exam: Vitals:   11/28/17 0852 11/28/17 1444  BP: 127/67 (!) 143/65  Pulse: 81 78  Resp: 12 15  Temp: 98.3 F (36.8 C) 98.2 F (36.8 C)  SpO2: 98% 100%   Gen: In bed, NAD Resp: non-labored breathing, no acute distress Abd: soft, nt  Neuro: MS: lethargic, tells me his name, tells me that he does not know where he is, no perseveration today. CN: Pupils equal round and reactive to light, extraocular movements intact. He fixates and tracks, counts fingers.  Motor: He moves all extremities to command Sensory: Endorses symmetric sensation  Pertinent Labs: Creatinine 8  CSF WBC 0 RBC 52 Protein 31 Glucose 65  Impression: 67 year old male with progressive encephalopathy after presentation most consistent with sepsis.  His exam initially improved and then subsequently worsened which I think does raise the possibility of antibiotic associated encephalopathy. Of note, acyclovir can be associated with encephalopathy and I have very low suspicion for herpes  And therefore this is been stopped.  Vanc/Zosyn of also been stopped.  Of note, he was started on keppra on first day of admission, but the description of the event sounds like rigors, not seizure. Currently EEG not showing signs of seizure, but with waxing/waning will need prolonged EEG.   Recommendations: 1) neurology will continue to follow 2) LTM EEG   Roland Rack, MD [facility] [phone]  If 7pm- 7am, please page neurology on call as listed in [place].

## 2017-11-29 ENCOUNTER — Inpatient Hospital Stay (HOSPITAL_COMMUNITY): Payer: Medicare Other

## 2017-11-29 ENCOUNTER — Encounter (HOSPITAL_COMMUNITY): Payer: Self-pay

## 2017-11-29 DIAGNOSIS — G92 Toxic encephalopathy: Secondary | ICD-10-CM

## 2017-11-29 LAB — CBC
HEMATOCRIT: 37.2 % — AB (ref 39.0–52.0)
Hemoglobin: 12.4 g/dL — ABNORMAL LOW (ref 13.0–17.0)
MCH: 30 pg (ref 26.0–34.0)
MCHC: 33.3 g/dL (ref 30.0–36.0)
MCV: 90.1 fL (ref 78.0–100.0)
Platelets: 276 10*3/uL (ref 150–400)
RBC: 4.13 MIL/uL — AB (ref 4.22–5.81)
RDW: 17 % — AB (ref 11.5–15.5)
WBC: 6.2 10*3/uL (ref 4.0–10.5)

## 2017-11-29 LAB — BASIC METABOLIC PANEL
ANION GAP: 19 — AB (ref 5–15)
BUN: 23 mg/dL — ABNORMAL HIGH (ref 6–20)
CO2: 22 mmol/L (ref 22–32)
Calcium: 8.8 mg/dL — ABNORMAL LOW (ref 8.9–10.3)
Chloride: 95 mmol/L — ABNORMAL LOW (ref 101–111)
Creatinine, Ser: 7.85 mg/dL — ABNORMAL HIGH (ref 0.61–1.24)
GFR calc Af Amer: 7 mL/min — ABNORMAL LOW (ref >60)
GFR calc non Af Amer: 6 mL/min — ABNORMAL LOW (ref >60)
GLUCOSE: 62 mg/dL — AB (ref 65–99)
Potassium: 3.7 mmol/L (ref 3.5–5.1)
Sodium: 136 mmol/L (ref 135–145)

## 2017-11-29 LAB — PROTIME-INR
INR: 1.97
PROTHROMBIN TIME: 22.3 s — AB (ref 11.4–15.2)

## 2017-11-29 LAB — TSH: TSH: 1.535 u[IU]/mL (ref 0.350–4.500)

## 2017-11-29 LAB — HEPARIN LEVEL (UNFRACTIONATED): Heparin Unfractionated: 0.39 IU/mL (ref 0.30–0.70)

## 2017-11-29 LAB — GLUCOSE, CAPILLARY
GLUCOSE-CAPILLARY: 65 mg/dL (ref 65–99)
Glucose-Capillary: 54 mg/dL — ABNORMAL LOW (ref 65–99)
Glucose-Capillary: 104 mg/dL — ABNORMAL HIGH (ref 65–99)

## 2017-11-29 MED ORDER — PRO-STAT SUGAR FREE PO LIQD
30.0000 mL | Freq: Two times a day (BID) | ORAL | Status: DC
  Administered 2017-11-29 – 2017-12-02 (×6): 30 mL
  Filled 2017-11-29 (×6): qty 30

## 2017-11-29 MED ORDER — DEXTROSE 50 % IV SOLN
INTRAVENOUS | Status: AC
  Administered 2017-11-29: 50 mL
  Filled 2017-11-29: qty 50

## 2017-11-29 MED ORDER — MIDODRINE HCL 5 MG PO TABS
5.0000 mg | ORAL_TABLET | Freq: Two times a day (BID) | ORAL | Status: DC
  Administered 2017-11-30 – 2017-12-02 (×6): 5 mg
  Filled 2017-11-29 (×6): qty 1

## 2017-11-29 MED ORDER — ATORVASTATIN CALCIUM 40 MG PO TABS
40.0000 mg | ORAL_TABLET | Freq: Every day | ORAL | Status: DC
  Administered 2017-11-29 – 2017-11-30 (×2): 40 mg
  Filled 2017-11-29 (×2): qty 1

## 2017-11-29 MED ORDER — AMIODARONE HCL 200 MG PO TABS
200.0000 mg | ORAL_TABLET | Freq: Every day | ORAL | Status: DC
  Administered 2017-11-30 – 2017-12-02 (×3): 200 mg
  Filled 2017-11-29 (×3): qty 1

## 2017-11-29 MED ORDER — QUETIAPINE FUMARATE 25 MG PO TABS
12.5000 mg | ORAL_TABLET | Freq: Every day | ORAL | Status: DC
  Administered 2017-11-29 – 2017-12-01 (×3): 12.5 mg
  Filled 2017-11-29 (×3): qty 1

## 2017-11-29 MED ORDER — ACETAMINOPHEN 160 MG/5ML PO SOLN
650.0000 mg | Freq: Four times a day (QID) | ORAL | Status: DC | PRN
Start: 2017-11-29 — End: 2017-12-02
  Filled 2017-11-29: qty 20.3

## 2017-11-29 MED ORDER — NEPRO/CARBSTEADY PO LIQD
1000.0000 mL | ORAL | Status: DC
  Administered 2017-11-29 – 2017-12-02 (×4): 1000 mL
  Filled 2017-11-29 (×5): qty 1000

## 2017-11-29 NOTE — Progress Notes (Addendum)
[facility] Progress Note   Subjective: Opens his eyes, says "hello" "how are you" then drifts off again. Wife reports waxing and waning of mental status but improved since HD 11/27/17. He denies pain/SOB.   Objective Vitals:   11/28/17 0852 11/28/17 1444 11/28/17 2100 11/29/17 0500  BP: 127/67 (!) 143/65 (!) 144/102 (!) 150/59  Pulse: 81 78 84 80  Resp: 12 15 16 17   Temp: 98.3 F (36.8 C) 98.2 F (36.8 C) 98.6 F (37 C) 98.3 F (36.8 C)  TempSrc: Axillary Axillary Oral Axillary  SpO2: 98% 100% 100% 100%  Weight:  89.2 kg (196 lb 10.4 oz)    Height:       Physical Exam General: Chronically ill appearing male in NAD Heart: S1,S2, No M/R/B Lungs: CTAB A/P Abdomen: Active BS Extremities: No stump edema but trace edema upper thighs, hands.  Dialysis Access: [facility] CDI   Additional Objective Labs: Basic Metabolic Panel: Recent Labs  Lab 11/23/17 0400 11/24/17 0511  11/27/17 0501 11/28/17 0347 11/29/17 0403  NA 140 136   < > 135 134* 136  K 4.8 3.9   < > 4.0 3.8 3.7  CL 105 96*   < > 94* 94* 95*  CO2 19* 24   < > 22 23 22   GLUCOSE 96 78   < > 68 67 62*  BUN 59* 29*   < > 31* 15 23*  CREATININE 11.32* 7.27*   < > 7.99* 5.91* 7.85*  CALCIUM 9.1 8.7*   < > 8.8* 8.6* 8.8*  PHOS 4.2 3.2  --   --   --   --    < > = values in this interval not displayed.   Liver Function Tests: No results for input(s): AST, ALT, ALKPHOS, BILITOT, PROT, ALBUMIN in the last 168 hours. No results for input(s): LIPASE, AMYLASE in the last 168 hours. CBC: Recent Labs  Lab 11/25/17 0837 11/26/17 0406 11/27/17 0501 11/28/17 0347 11/29/17 0403  WBC 5.7 6.1 6.8 6.1 6.2  NEUTROABS 3.8  --   --   --   --   HGB 11.0* 12.2* 12.4* 12.0* 12.4*  HCT 33.4* 37.0* 37.9* 35.7* 37.2*  MCV 89.3 89.4 89.6 89.3 90.1  PLT 192 181 205 320 276   Blood Culture    Component Value Date/Time   SDES CSF 11/25/2017 1116   SPECREQUEST NONE 11/25/2017 1116   CULT  11/25/2017 1116   NO GROWTH 3 DAYS Performed at [facility] [address] [place], [place] [address]    REPTSTATUS 11/28/2017 FINAL 11/25/2017 1116    Cardiac Enzymes: No results for input(s): CKTOTAL, CKMB, CKMBINDEX, TROPONINI in the last 168 hours. CBG: Recent Labs  Lab 11/27/17 1733 11/27/17 2008 11/27/17 2044 11/28/17 0014 11/28/17 0401  GLUCAP 77 65 129* 88 74   Iron Studies: No results for input(s): IRON, TIBC, TRANSFERRIN, FERRITIN in the last 72 hours. @lablastinr3 @ Studies/Results: No results found. Medications: . sodium chloride    . famotidine (PEPCID) IV Stopped (11/28/17 1027)  . heparin 1,250 Units/hr (11/28/17 0958)  . thiamine injection Stopped (11/28/17 2226)   . amiodarone  200 mg Oral Daily  . atorvastatin  40 mg Oral QHS  . latanoprost  1 drop Both Eyes QHS  . LORazepam  1 mg Intravenous Once  . mouth rinse  15 mL Mouth Rinse BID  . midodrine  5 mg Oral BID WC  . QUEtiapine  12.5 mg Oral QHS  . sodium chloride  flush  3 mL Intravenous Q12H  . Warfarin - Pharmacist Dosing Inpatient   Does not apply q1800     Dialysis Orders: TTS at [facility] 4h 91.5kg 2/2.5 bath p4 Hep 4000+ 2000 midrun Left chest tdc Venofer 50mg  IV q week  Impression: 1 Metabolic encephalopathy/possbile viral encephalitis: Presented with AMS/Sepsis. No clear etiology. Neurology following.  ABX dc'd 11/28/17 per ID.  Cx's negative resp pcr neg and csf unremarkable. AMS still variable, possibly improving. 2 ESRD HD T,Th,S - using tunneled hd cath, blood cx's negative. Next HD 11/29/17 on schedule.  3 Hypotension/Volume: Chronic hypotension on midodrine. HD 11/27/17 Pre wt 92.3 kg net UF 1.0 liter Post wt 88.9 kg. Now under OP EDW. BP stable. No stump edema but trace edema upper thighs and hand. Lower EDW-UFG 2.5-3 liters at tolerated.  4 HGB 12.4 No ESA. Follow HGB.  5 Afib-Rate controlled on amiodarone, coumadin per pharmacy 6 T2DM:  per primary 7 CKD BMD: Renal function panel with HD  tomorrow.  8 hx cva and/or seizures - started on keppra here, mri head negative. EEG in progress at bedside.    Rita H. Brown NP-C 11/29/2017, 9:10 AM  [employer] [phone]   I have seen and examined this patient and agree with plan and assessment in the above note with renal recommendations/intervention highlighted.  Pt only able to open eyes and grunt to voice.  EEG in progress.  Continue with HD qTTS while he remains an inpatient.  Oscar John A Santana [place],MD 11/29/2017 2:19 PM

## 2017-11-29 NOTE — Progress Notes (Signed)
Cortrak Tube Team Note:  Consult received to place a Cortrak feeding tube.   A 10 F Cortrak tube was placed in the right nare and secured with a nasal bridle at 65 cm. Per the Cortrak monitor reading the tube tip is gastric.   X-ray is required, abdominal x-ray has been ordered by the Cortrak team. Please confirm tube placement before using the Cortrak tube.   If the tube becomes dislodged please keep the tube and contact the Cortrak team at [email or web address] ([email or web address]) for replacement.  If after hours and replacement cannot be delayed, place a NG tube and confirm placement with an abdominal x-ray.    Mariana Single RD, LDN Clinical Nutrition Pager # [phone]

## 2017-11-29 NOTE — Progress Notes (Signed)
SLP Cancellation Note  Patient Details Name: Oscar Castillo MRN: [id number] DOB: [birth year]   Cancelled treatment:       Reason Eval/Treat Not Completed: Fatigue/lethargy limiting ability to participate   Germain Osgood 11/29/2017, 1:46 PM  Germain Osgood, M.A. CCC-SLP [phone]

## 2017-11-29 NOTE — Progress Notes (Signed)
LTM EEG maint complete. No skin breakdown at this time.

## 2017-11-29 NOTE — Progress Notes (Signed)
[place] for Heparin Indication: atrial fibrillation  Allergies  Allergen Reactions  . Ceftriaxone Sodium In Dextrose Anaphylaxis  . Ciprofloxacin Other (See Comments)    UNSPECIFIED PAIN  . Nsaids Other (See Comments)    HISTORY OF ULCER  . Penicillins Swelling    SWELLING REACTION UNSPECIFIED  [ PATIENT WITH HX OF ANAPHYLAXIS TO CEFTRIAXONE ] 11/26/17:  pt has received 3 doses of zosyn in past (04/2017.) No allergy tor adverse event to zosyn noted at that time.     . Sulfa Antibiotics Swelling    SWELLING REACTION UNSPECIFIED     Patient Measurements: Height: 5\' 10"  (177.8 cm)(on 10/25/2017) Weight: 196 lb 10.4 oz (89.2 kg) IBW/kg (Calculated) : 73 Heparin Dosing Weight: 88.9 kg  Vital Signs: Temp: 98.3 F (36.8 C) (05/06 0500) Temp Source: Axillary (05/06 0500) BP: 150/59 (05/06 0500) Pulse Rate: 80 (05/06 0500)  Labs: Recent Labs    11/27/17 0501  11/27/17 2028 11/28/17 0347 11/29/17 0403  HGB 12.4*  --   --  12.0* 12.4*  HCT 37.9*  --   --  35.7* 37.2*  PLT 205  --   --  320 276  LABPROT 17.4*  --   --  19.4* 22.3*  INR 1.44  --   --  1.66 1.97  HEPARINUNFRC 0.65   < > 0.64 0.57 0.39  CREATININE 7.99*  --   --  5.91* 7.85*   < > = values in this interval not displayed.    Estimated Creatinine Clearance: 10.4 mL/min (A) (by C-G formula based on SCr of 7.85 mg/dL (H)).   Medical History: Past Medical History:  Diagnosis Date  . Anemia   . Antral ulcer 2014   small  . BENIGN PROSTATIC HYPERTROPHY   . Bipolar disorder ([place])    "sometimes" (10/07/2016)  . CHOLELITHIASIS   . Chronic combined systolic and diastolic CHF (congestive heart failure) ([place])   . Complication of anesthesia    wife states pt had trouble waking up with in Nov., 2014  . CVA (cerebral vascular accident) ([place]) 07/2007   No residual effect  . DEPRESSION   . DIABETES MELLITUS, TYPE II    diet control.  No medication  since November 2015  .  ERECTILE DYSFUNCTION   . ESRD on hemodialysis ([place])    ESRD due to DM/HTN. .  HD TTS at [facility] on [place].  [name] GERD   . GI bleed    due to gastritis, discharged 10/02/16/notes 10/07/2016  . Hepatitis C    C - has been treated  . History of Clostridium difficile   . History of kidney stones   . HYPERTENSION   . LBBB (left bundle branch block)   . Morbid obesity ([place])   . PAF (paroxysmal atrial fibrillation) ([place])    a. Dx 12/2015.    Assessment: 67 year old male on Coumadin 2.5mg  daily exc 1.25mg  on Sat PTA for Afib. Held for LP (last dose PTA) s/p reversal with Vitamin K 5mg  IVx1. Coumadin restarted on 5/2. INR trending back up and is at 1.97 today although only 1 dose given over past 4 days as patient NPO and encephalopathic. Hgb 12.4, plts wnl.  Goal of Therapy:  INR 2-3 Heparin level 0.3-0.7 units/ml Monitor platelets by anticoagulation protocol: Yes   Plan:  Continue heparin gtt at 1,250 units/hr Hold Coumadin as NPO  Monitor daily INR / heparin level, CBC, s/s of bleed F/U restart of Coumadin when  able  Elenor Quinones, PharmD, BCPS Clinical Pharmacist Pager [phone] 11/29/2017 1:25 PM

## 2017-11-29 NOTE — Procedures (Signed)
  Electroencephalogram report- LTM    Data acquisition: 10-20 electrode placement.  Additional T1, T2, and EKG electrodes; 26 channel digital referential acquisition reformatted to 18 channel/7 channel coronal bipolar     Spike detection: ON     Seizure detection: ON   Beginning time: 11/28/2017 at [place] Ending time: 11/29/2017 at 0730  CPT: 95951 Day of study: Day 1   This 17 hours of intensive EEG monitoring with simultaneous video monitoring was performed for this patient with spells confusion and right facial weakness to rule out clinical and subclinical electrographic seizures to explain his symptoms. Medications: As per EMR  There was no pushbutton activations events during this recording.  Automated spike detection program did not detect any spikes. Seizure detection program did not detect any seizures .   There was no clinical or subclinical seizures present.  Background activities marked by 2 to 4 cps background activity slowing with more prominent 1.5 to 2 cps delta slowing distributed more anteriorly and in continuous fashion.  Background activity is at x10 to reach 5 to 6 cps with posterior dominance.  At times delta slowing carry triphasic morphology however triphasic waves were not a prominent activity throughout this recording.  There was no interhemispheric asymmetries, focal abnormalities or epileptiform discharges present.  No clinical or subclinical seizures.  Clinical interpretation: This 17 hours of intensive EEG monitoring with simultaneous video monitoring did not record any clinical subclinical seizures.  Background activities were abnormal due to background activity slowing as discussed above.  These findings suggestive of moderate encephalopathy of nonspecific etiologies including toxic metabolic pharmacological multifocal degenerative etiologies.  Clinical correlation is advised.

## 2017-11-29 NOTE — Progress Notes (Signed)
Nutrition Follow-up  DOCUMENTATION CODES:   Not applicable  INTERVENTION:  RD received verbal order to begin tubefeeding from MD  Will begin NePro at 66mL/hr, increase by 10 every 12 hours to goal rate of 52mL/hr  Pro-stat 28mL BID  At goal rate, provides 2576 calories, 137 grams of protein, 95mL free water  Recommend Monitor K+, Phos, Mg BID for at least 3 days, MD replete as necessary. patient is at risk for refeeding with no PO x8 days  NUTRITION DIAGNOSIS:   Inadequate oral intake related to inability to eat(lethargy per SLP) as evidenced by NPO status. -ongoing  GOAL:   Patient will meet greater than or equal to 90% of their needs -unmet  MONITOR:   TF tolerance, Diet advancement, Weight trends, I & O's  ASSESSMENT:   67 y.o m with ESRD on HD TTS, Essential HTN, HCV, Afib on coumadin, complete heart block s/p ppm, bilateral BKA, Diabetes mellitus, Bipolar disorder, hx of renal stones, HFrEF takes midodrine at home, prior CVA who presented with acute encephalopathy, dysarthria, right facial droop, fatigue, and in septic shock. Patient had received only 3hrs af his last dialysis session rather than the usual 4hrs he usually gets.   Continues to be lethargic, did not wake up for RD today. Wife at bedside. This RD made her aware of our role in managing tubefeeding and nutrition. Answered questions. Cortrak tube placed, xray confirmed. Weight down from 202 - 196 over 1 week.  HD today  Labs reviewed Medications reviewed and include:  Heparin gtt   Diet Order:   Diet Order           Diet NPO time specified  Diet effective now          EDUCATION NEEDS:   Not appropriate for education at this time  Skin:  Skin Assessment: Skin Integrity Issues: Skin Integrity Issues:: Stage I Stage I: sacrum  Last BM:  11/22/2017  Height:   Ht Readings from Last 1 Encounters:  11/27/17 5\' 10"  (1.778 m)    Weight:   Wt Readings from Last 1 Encounters:  11/28/17 196  lb 10.4 oz (89.2 kg)    Ideal Body Weight:  68.02 kg  BMI:  Body mass index is 28.22 kg/m.  Estimated Nutritional Needs:   Kcal:  2200-2600 calories  Protein:  130-150 grams  Fluid:  UOP +1L  Oscar Castillo. Oscar Manukyan, MS, RD LDN Inpatient Clinical Dietitian Pager [phone]

## 2017-11-29 NOTE — Consult Note (Signed)
   [facility] CM Inpatient Consult   11/29/2017  Oscar Castillo 1951/06/04 [id number]   Patient screened for extreme in the  high risk of unplanned re-admission score and had been previously active [place] [facility] services with [place] and [place] worker was able to assist with SCAT.  Patient is currently asleep on round and no family at the bedside.  Did not disturb at this time.    Patient is in the [place] of the [place] Management services under patient's Medicare plan. Will continue to follow up for progression and needs.    For questions contact:   Natividad Brood, RN BSN [facility] Liaison  [phone] business mobile phone Toll free office [phone]

## 2017-11-29 NOTE — Consult Note (Signed)
Subjective: Patient in bed sleeping. LTM running. NAD. NGT present, but clamped.  Exam: Vitals:   11/28/17 2100 11/29/17 0500  BP: (!) 144/102 (!) 150/59  Pulse: 84 80  Resp: 16 17  Temp: 98.6 F (37 C) 98.3 F (36.8 C)  SpO2: 100% 100%    Physical Exam  HEENT-  Normocephalic, no lesions, without obvious abnormality.  Normal external eye and conjunctiva.   Cardiovascular- S1-S2 audible, radial pulses palpable.  No pedal pulses due to BKA  Lungs-no rhonchi or wheezing noted, no excessive working breathing.  Saturations within normal limits Abdomen- All 4 quadrants palpated and non tender. Bowel sounds present and sluggish in all 4 quadrants. Extremities- Warm, dry and intact Musculoskeletal-no joint tenderness, deformity or swelling Skin-warm and dry, no hyperpigmentation, vitiligo, or suspicious lesions  Neuro:  Mental Status: Drowsy oriented to person.  Speech slow with poverty of thought; in this context fluent without evidence of aphasia.  An episode of speech arrest was noted without EEG correlate. Able to follow simple commands when cooperative. Waxing and waning mental status seen during exam without electrographic seizure seen on LTM EEG.  Cranial Nerves: II:  Visual fields grossly normal in initial exam. On follow up exam during waning mental status, he did not blink to threat.   III,IV, VI: ptosis not present, extra-ocular motions intact bilaterally pupils equal, round, reactive to light and accommodation V,VII: facial light touch sensation normal bilaterally VIII: hearing normal bilaterally IX,X: UTA uncooperative with exam XI: bilateral shoulder shrug XII: UTA Motor: Right : Upper extremity   4/5    Left:     Upper extremity   4/5  Lower extremity  Non-cooperative   Lower extremity   Non-cooperative Tone and bulk:normal tone throughout; no atrophy noted Sensory:  light touch intact throughout, bilaterally Cerebellar: UTA     Medications:  Scheduled: .  amiodarone  200 mg Oral Daily  . atorvastatin  40 mg Oral QHS  . latanoprost  1 drop Both Eyes QHS  . LORazepam  1 mg Intravenous Once  . mouth rinse  15 mL Mouth Rinse BID  . midodrine  5 mg Oral BID WC  . QUEtiapine  12.5 mg Oral QHS  . sodium chloride flush  3 mL Intravenous Q12H  . Warfarin - Pharmacist Dosing Inpatient   Does not apply q1800   Continuous: . sodium chloride    . famotidine (PEPCID) IV Stopped (11/28/17 1027)  . heparin 1,250 Units/hr (11/29/17 0945)  . thiamine injection Stopped (11/28/17 2226)   BPZ:WCHENI chloride, acetaminophen **OR** acetaminophen, albuterol, ondansetron (ZOFRAN) IV  Pertinent Labs/Diagnostics: LTM EEG: Background activities marked by 2 to 4 cps background activity slowing with more prominent 1.5 to 2 cps delta slowing distributed more anteriorly and in continuous fashion.  Background activity is at x10 to reach 5 to 6 cps with posterior dominance.  At times delta slowing carry triphasic morphology however triphasic waves were not a prominent activity throughout this recording. There was no interhemispheric asymmetries, focal abnormalities or epileptiform discharges present.  No clinical or subclinical seizures. Clinical interpretation: This 17 hours of intensive EEG monitoring with simultaneous video monitoring did not record any clinical subclinical seizures.  Background activities were abnormal due to background activity slowing as discussed above.  These findings suggestive of moderate encephalopathy of nonspecific etiologies including toxic metabolic pharmacological multifocal degenerative etiologies.    Laurey Morale, NP-C [facility] [phone]  Impression: 67 year old male with progressive encephalopathy after presentation most consistent with sepsis. 1. His exam initially  improved and then subsequently worsened which does raise the possibility of antibiotic associated encephalopathy.Of note, acyclovir can be associated  with encephalopathyand there is a very low suspicion for herpes and therefore this is been stopped.  Vanc/Zosyn has also been stopped. 2. Of note, he was started on keppra on first day of admission, but the description of the event sounds like rigors, not seizure.  3. Currently EEG not showing signs of seizure and no electrographic correlate on LTM EEG to changes in mental status during attending bedside evaluation. Will continue EEG for one more day and if no electrographic seizures, will discontinue on Tuesday.   Recommendations: 1) Neurology will continue to follow 2) Continue LTM EEG for one more day and if no electrographic seizures, will discontinue on Tuesday.  Electronically signed: Dr. Kerney Elbe 11/29/2017, 10:35 AM

## 2017-11-29 NOTE — Progress Notes (Signed)
Patient Demographics:    Oscar Castillo, is a 67 y.o. male, DOB - [birth year], WLS:[id number]  Admit date - 11/21/2017   Admitting Physician Reyne Dumas, MD  Outpatient Primary MD for the patient is [name], MD  LOS - 8   Chief Complaint  Patient presents with  . Code Stroke        Subjective:    Oscar Castillo today has no fevers, no emesis, waking up a bit more, able to answer simple questions,  Assessment  & Plan :    Principal Problem:   Toxic metabolic encephalopathy Active Problems:   Severe sepsis (HCC)   Septic shock (HCC)   Pulmonary edema   Pressure injury of skin   CSF WBC 0 RBC 52 Protein 31 Glucose 65    Brief Narrative:  67 year old male who presents with right facial droop and altered mental status. He does have significant past medical history for end-stage renal disease on hemodialysis, atrial fibrillation, diastolic heart failure, history of CVA. Patient developed right facial droop,chills and altered mentation, acute presentation. About 7 days prior he was noted to have decreased po intake.On initial physical examination he was found febrile 104 F, with a septic shock physiology. He was poorly responsive.  He was admitted on 11/21/2017 with  concerns about sepsis, Patient was placed on broad-spectrum antibiotic therapy, prolonged hospital stay, the source of his sepsis continued to be not clear, suspected viral encephalitis. 67 year old male with progressive encephalopathy after presentation most consistent with sepsis.     Plan:-  1)Metabolic encephalopathy/ suspected viral encephalitis. Patient still responding to simple questions,  Clinically possible viral encephalitis but CSF wbc was 0. ,  Continues to have progressive encephalopathy after presentation most consistent with sepsis. EEG without definite seizure activity, neurology input appreciated,  infectious disease input appreciated, continue thiamine  2)Sepsis. No clear bacterial infection,  discontinued empiric antibiotic therapy (Vanco/Zosyn) on 11/28/17, per ID recommendations.    3)Chronic atrial fibrillation with prior history of stroke-.   Continue amiodarone.   Patient was on warfarin for anticoagulation, INR is trending up (1.97 Now) despite the fact that pt has not received any Coumadin for the last 3 days or so.  INR is trending up due to lack of vitamin K intake and the patient was n.p.o.,   He remains n.p.o. due to  altered mentation and n.p.o. status .    Continue IV heparin, may restart Coumadin once patient is tolerating tube feeding well and once it is clear that he does not need further procedures  4)Type 2 diabetes mellitus. - Use Novolog/Humalog Sliding scale insulin with Accu-Cheks/Fingersticks as ordered, monitor closely with tube feedings   5).History of CVA and seizures. No clinical active seizures. Video  EEG without definite seizures, continue Lipitor for secondary stroke prophylaxis  6. ESRD on HD.  Follow on nephrology recommendations, regards on HD treatments, T/T/S, will continue midodrine for hypotension, last HD 11/27/2017, next HD 11/30/2017  7. Depression. On seroquel.  8)Anemia of CKD- EPO agent as per nephrologist, no evidence of ongoing bleeding, check H&H and transfuse as clinically indicated  9)FEN-dietitian consult requested to start Nepro tube feed via Cortrac Nasal  feeding tube   DVT prophylaxis: iv heparin Code Status:full Family Communication:no family  at the bedside Disposition Plan: TBD  Consultants:  ID   Nephrology   Neurology  Procedures:   Antimicrobials:  Vancomycin   Zosyn   DVT Prophylaxis  : iv Heparin  Lab Results  Component Value Date   PLT 276 11/29/2017    Inpatient Medications  Scheduled Meds: . amiodarone  200 mg Oral Daily  . atorvastatin  40 mg Oral QHS  . feeding supplement  (NEPRO CARB STEADY)  1,000 mL Per Tube Q24H  . feeding supplement (PRO-STAT SUGAR FREE 64)  30 mL Per Tube BID  . latanoprost  1 drop Both Eyes QHS  . LORazepam  1 mg Intravenous Once  . mouth rinse  15 mL Mouth Rinse BID  . midodrine  5 mg Oral BID WC  . QUEtiapine  12.5 mg Oral QHS  . sodium chloride flush  3 mL Intravenous Q12H   Continuous Infusions: . sodium chloride    . famotidine (PEPCID) IV Stopped (11/29/17 1200)  . heparin 1,250 Units/hr (11/29/17 0945)  . thiamine injection Stopped (11/29/17 1323)   PRN Meds:.sodium chloride, acetaminophen **OR** acetaminophen, albuterol, ondansetron (ZOFRAN) IV    Anti-infectives (From admission, onward)   Start     Dose/Rate Route Frequency Ordered Stop   11/27/17 1015  vancomycin (VANCOCIN) 1-5 GM/200ML-% IVPB    Note to Pharmacy:  Almira Bar   : cabinet override      11/27/17 1015 11/27/17 1230   11/26/17 1545  piperacillin-tazobactam (ZOSYN) IVPB 3.375 g     3.375 g 12.5 mL/hr over 240 Minutes Intravenous Every 12 hours 11/26/17 1501 11/29/17 0240   11/23/17 1800  acyclovir (ZOVIRAX) 460 mg in dextrose 5 % 100 mL IVPB  Status:  Discontinued     460 mg 109.2 mL/hr over 60 Minutes Intravenous Every 24 hours 11/22/17 1434 11/25/17 1821   11/23/17 1413  vancomycin (VANCOCIN) 1-5 GM/200ML-% IVPB    Note to Pharmacy:  Cherylann Banas   : cabinet override      11/23/17 1413 11/23/17 1601   11/23/17 1200  vancomycin (VANCOCIN) IVPB 1000 mg/200 mL premix     1,000 mg 200 mL/hr over 60 Minutes Intravenous Every T-Th-Sa (Hemodialysis) 11/21/17 2326 11/27/17 1127   11/22/17 1800  acyclovir (ZOVIRAX) 460 mg in dextrose 5 % 100 mL IVPB  Status:  Discontinued     460 mg 109.2 mL/hr over 60 Minutes Intravenous Every 24 hours 11/22/17 1416 11/22/17 1434   11/22/17 1445  acyclovir (ZOVIRAX) 460 mg in dextrose 5 % 100 mL IVPB     460 mg 109.2 mL/hr over 60 Minutes Intravenous  Once 11/22/17 1434 11/22/17 1604   11/21/17 2300  meropenem  (MERREM) 500 mg in sodium chloride 0.9 % 100 mL IVPB  Status:  Discontinued     500 mg 200 mL/hr over 30 Minutes Intravenous Every 24 hours 11/21/17 2226 11/26/17 1436   11/21/17 2300  vancomycin (VANCOCIN) 2,000 mg in sodium chloride 0.9 % 500 mL IVPB     2,000 mg 250 mL/hr over 120 Minutes Intravenous  Once 11/21/17 2249 11/22/17 0157        Objective:   Vitals:   11/28/17 1444 11/28/17 2100 11/29/17 0500 11/29/17 1346  BP: (!) 143/65 (!) 144/102 (!) 150/59 107/64  Pulse: 78 84 80 79  Resp: 15 16 17 18   Temp: 98.2 F (36.8 C) 98.6 F (37 C) 98.3 F (36.8 C) 99.1 F (37.3 C)  TempSrc: Axillary Oral Axillary Oral  SpO2: 100% 100% 100% 99%  Weight: 89.2 kg (196 lb 10.4 oz)     Height:        Wt Readings from Last 3 Encounters:  11/28/17 89.2 kg (196 lb 10.4 oz)  10/25/17 95.3 kg (210 lb)  10/13/17 95.3 kg (210 lb)    No intake or output data in the 24 hours ending 11/29/17 1804   Physical Exam  Gen:-Chronically ill-appearing, in no acute distress HEENT:- [place].AT, No sclera icterus, EEG leads Nose-core TRAC feeding tube Neck-Supple Neck,No JVD,.  Left IJ hemodialysis catheter site is clean dry and intact Lungs-diminished in bases, no wheezing CV- S1, S2 normal Abd-  +ve B.Sounds, Abd Soft, No tenderness,    Extremity/Skin:-Bilateral BKA, left arm AV fistula appears not to be functioning  Psych-affect is flat, oriented x 1 Neuro-wakes up  to answer simple questions, no tremors or seizure type activity noted, generalized weakness without focal deficits, appears to be moving extremities GU-condom catheter with clear urine  Data Review:   Micro Results Recent Results (from the past 240 hour(s))  Culture, blood (single)     Status: None   Collection Time: 11/21/17 10:58 PM  Result Value Ref Range Status   Specimen Description BLOOD RIGHT ANTECUBITAL  Final   Special Requests   Final    BOTTLES DRAWN AEROBIC AND ANAEROBIC Blood Culture adequate volume   Culture    Final    NO GROWTH 5 DAYS Performed at [facility], [address], [place], [place] [address]    Report Status 11/26/2017 FINAL  Final  MRSA PCR Screening     Status: None   Collection Time: 11/22/17  4:28 AM  Result Value Ref Range Status   MRSA by PCR NEGATIVE NEGATIVE Final    Comment:        The GeneXpert MRSA Assay (FDA approved for NASAL specimens only), is one component of a comprehensive MRSA colonization surveillance program. It is not intended to diagnose MRSA infection nor to guide or monitor treatment for MRSA infections. Performed at [facility], [place] [address] [place], [place] [address]   Respiratory Panel by PCR     Status: None   Collection Time: 11/22/17  4:31 AM  Result Value Ref Range Status   Adenovirus NOT DETECTED NOT DETECTED Final   Coronavirus 229E NOT DETECTED NOT DETECTED Final   Coronavirus HKU1 NOT DETECTED NOT DETECTED Final   Coronavirus NL63 NOT DETECTED NOT DETECTED Final   Coronavirus OC43 NOT DETECTED NOT DETECTED Final   Metapneumovirus NOT DETECTED NOT DETECTED Final   Rhinovirus / Enterovirus NOT DETECTED NOT DETECTED Final   Influenza A NOT DETECTED NOT DETECTED Final   Influenza B NOT DETECTED NOT DETECTED Final   Parainfluenza Virus 1 NOT DETECTED NOT DETECTED Final   Parainfluenza Virus 2 NOT DETECTED NOT DETECTED Final   Parainfluenza Virus 3 NOT DETECTED NOT DETECTED Final   Parainfluenza Virus 4 NOT DETECTED NOT DETECTED Final   Respiratory Syncytial Virus NOT DETECTED NOT DETECTED Final   Bordetella pertussis NOT DETECTED NOT DETECTED Final   Chlamydophila pneumoniae NOT DETECTED NOT DETECTED Final   Mycoplasma pneumoniae NOT DETECTED NOT DETECTED Final  Culture, blood (single)     Status: None   Collection Time: 11/22/17  6:08 AM  Result Value Ref Range Status   Specimen Description BLOOD LEFT ANTECUBITAL  Final   Special Requests   Final    BOTTLES DRAWN AEROBIC ONLY Blood Culture adequate volume   Culture    Final  NO GROWTH 5 DAYS Performed at [facility], [place] [address], [place], [place] [address]    Report Status 11/27/2017 FINAL  Final  Culture, blood (single)     Status: None   Collection Time: 11/22/17  6:18 AM  Result Value Ref Range Status   Specimen Description BLOOD LEFT HAND  Final   Special Requests   Final    BOTTLES DRAWN AEROBIC ONLY Blood Culture adequate volume   Culture   Final    NO GROWTH 5 DAYS Performed at [facility], [place] [address], [place], [place] [address]    Report Status 11/27/2017 FINAL  Final  CSF culture with Stat gram stain     Status: None   Collection Time: 11/25/17 11:16 AM  Result Value Ref Range Status   Specimen Description CSF  Final   Special Requests NONE  Final   Gram Stain   Final    CYTOSPIN SMEAR WBC PRESENT, PREDOMINANTLY MONONUCLEAR NO ORGANISMS SEEN    Culture   Final    NO GROWTH 3 DAYS Performed at [facility], [place] [address] [place], [place] [address]    Report Status 11/28/2017 FINAL  Final    Radiology Reports Dg Chest 2 View  Result Date: 11/22/2017 CLINICAL DATA:  Pulmonary edema No chest pain or SOB EXAM: CHEST - 2 VIEW COMPARISON:  the previous day's study FINDINGS: Lungs are clear. Stable tunneled left IJ hemodialysis catheter to the right atrium. Stable dual lead right subclavian pacemaker. Heart size and mediastinal contours are within normal limits. Tortuous thoracic aorta. No effusion. Visualized bones unremarkable. IMPRESSION: No acute cardiopulmonary disease. Electronically Signed   By: Lucrezia Europe M.D.   On: 11/22/2017 20:51   Mr Brain Wo Contrast  Result Date: 11/26/2017 CLINICAL DATA:  Initial evaluation for progressive encephalopathy the, unclear cause. EXAM: MRI HEAD WITHOUT CONTRAST TECHNIQUE: Multiplanar, multiecho pulse sequences of the brain and surrounding structures were obtained without intravenous contrast. COMPARISON:  Previous brain MRI from 11/22/2017. FINDINGS: Brain:  Generalized age-related cerebral atrophy with advanced chronic microvascular ischemic disease, stable. Multiple small remote lacunar infarcts present within the bilateral thalami and pons. Chronic brainstem atrophy again noted. No abnormal foci of restricted diffusion to suggest acute or subacute ischemia. Gray-white matter differentiation maintained. No areas of remote cortical infarction. No acute intracranial hemorrhage. Multiple chronic micro hemorrhages present within the bilateral thalami, consistent with chronic small vessel ischemic changes and/or hypertension. No mass lesion, midline shift or mass effect. Diffuse ventricular prominence related global parenchymal volume loss without hydrocephalus. No extra-axial fluid collection. Pituitary gland suprasellar region within normal limits. Midline structures intact and normal. Vascular: Major intracranial vascular flow voids are maintained. Skull and upper cervical spine: Craniocervical junction within normal limits. Degenerative spondylolysis noted at C3-4 and C4-5 without significant stenosis. Bone marrow signal intensity stable and within normal limits. No scalp soft tissue abnormality. Sinuses/Orbits: Globes and orbital soft tissues are stable and within normal limits. Patient status post lens extraction bilaterally. Paranasal sinuses remain largely clear. Other: Left mastoid effusion again noted, slightly progressed from previous. Small right mastoid effusion now evident as well. Nasopharynx remains clear. IMPRESSION: 1. Stable appearance of the brain with no acute intracranial abnormality identified. 2. Chronic atrophy with advanced chronic small vessel ischemic disease within the thalami and pons with associated chronic brainstem atrophy. 3. Slightly progressive left mastoid effusion with new small right mastoid effusion. Electronically Signed   By: Jeannine Boga M.D.   On: 11/26/2017 13:27  Mr Brain 36 Contrast  Result Date:  11/22/2017 CLINICAL DATA:  67 year old male with confusion. Code stroke presentation yesterday with right side facial droop. EXAM: MRI HEAD WITHOUT CONTRAST TECHNIQUE: Multiplanar, multiecho pulse sequences of the brain and surrounding structures were obtained without intravenous contrast. COMPARISON:  Head CT without contrast 11/21/2017 and earlier. Brain MRI 12/27/2015. FINDINGS: Brain: No restricted diffusion to suggest acute infarction. No midline shift, mass effect, evidence of mass lesion, ventriculomegaly, extra-axial collection or acute intracranial hemorrhage. Cervicomedullary junction and pituitary are within normal limits. Stable cerebral volume since 2017 with disproportionate atrophy of the brainstem. There is stable T2 heterogeneity throughout the mid and dorsal pons compatible with chronic lacunar infarcts. Cerebellar volume appears better preserved. Advanced chronic lacunar infarcts in the bilateral thalami some with associated chronic microhemorrhage. The basal ganglia are relatively spared. There is chronic Patchy and confluent supratentorial cerebral white matter T2 and FLAIR hyperintensity. No new signal abnormality compared to the 2017 MRI. Vascular: Major intracranial vascular flow voids are stable. Skull and upper cervical spine: Stable and negative. Normal bone marrow signal. Sinuses/Orbits: Interval postoperative changes to the right globe. Otherwise stable and negative. Other: A mild-to-moderate left mastoid effusion is new since 2018. Negative nasopharynx. The right mastoids remain clear. Grossly normal other visible internal auditory structures. Scalp and face soft tissues appear negative. IMPRESSION: 1.  No acute intracranial abnormality. 2. Chronic brainstem atrophy with evidence of advanced chronic small vessel disease in the thalami and pons appears stable since a 2017 MRI. 3. Left mastoid effusion is new since 2018 and probably postinflammatory. Electronically Signed   By: Genevie Ann  M.D.   On: 11/22/2017 11:28   Dg Chest Port 1 View  Result Date: 11/21/2017 CLINICAL DATA:  Fever and altered mentation. EXAM: PORTABLE CHEST 1 VIEW COMPARISON:  07/07/2017 FINDINGS: Dual lumen left IJ dialysis catheter is stable with tip in the right atrium. Right-sided pacemaker apparatus with right atrial and right ventricular leads redemonstrated. Clear lungs. Normal size cardiac and mediastinal contours. No acute nor suspicious osseous abnormality. IMPRESSION: No active cardiopulmonary disease. Electronically Signed   By: Ashley Royalty M.D.   On: 11/21/2017 21:06   Dg Abd Portable 1v  Result Date: 11/29/2017 CLINICAL DATA:  Feeding tube placement. EXAM: PORTABLE ABDOMEN - 1 VIEW COMPARISON:  CT abdomen pelvis dated May 06, 2017. FINDINGS: Feeding tube tip in the gastric body. There are few mildly dilated loops of small bowel in the central abdomen. Large amount of stool in the rectum. No acute osseous abnormality. IMPRESSION: 1. Feeding tube tip in the gastric body. 2. Non-specific bowel gas pattern may reflect mild ileus. Electronically Signed   By: Titus Dubin M.D.   On: 11/29/2017 11:19   Ct Head Code Stroke Wo Contrast  Result Date: 11/21/2017 CLINICAL DATA:  Code stroke. Right-sided facial droop. Not following commands. EXAM: CT HEAD WITHOUT CONTRAST TECHNIQUE: Contiguous axial images were obtained from the base of the skull through the vertex without intravenous contrast. COMPARISON:  07/07/2017 FINDINGS: Brain: No evidence of acute infarction, hemorrhage, hydrocephalus, extra-axial collection or mass lesion/mass effect. Atrophy, most notable at the brainstem. Extensive chronic small vessel ischemic change in the cerebral white matter. Vascular: Diffuse atherosclerotic calcification. No hyperdense vessel. Skull: No acute or aggressive finding Sinuses/Orbits: Bilateral cataract resection.  No acute finding Other: These results were communicated to Dr. Lorraine Lax at 6:35 pmon 4/28/2019by text  page via the [facility] messaging system. ASPECTS [facility] Stroke Program Early CT Score) - Ganglionic level infarction (caudate, lentiform nuclei,  internal capsule, insula, M1-M3 cortex): 7 - Supraganglionic infarction (M4-M6 cortex): 3 Total score (0-10 with 10 being normal): 10 IMPRESSION: 1. No acute finding. 2. Atrophy most prominent at the brainstem. Extensive chronic small vessel ischemia. Electronically Signed   By: Monte Fantasia M.D.   On: 11/21/2017 18:39     CBC Recent Labs  Lab 11/25/17 0837 11/26/17 0406 11/27/17 0501 11/28/17 0347 11/29/17 0403  WBC 5.7 6.1 6.8 6.1 6.2  HGB 11.0* 12.2* 12.4* 12.0* 12.4*  HCT 33.4* 37.0* 37.9* 35.7* 37.2*  PLT 192 181 205 320 276  MCV 89.3 89.4 89.6 89.3 90.1  MCH 29.4 29.5 29.3 30.0 30.0  MCHC 32.9 33.0 32.7 33.6 33.3  RDW 16.9* 16.8* 16.9* 17.0* 17.0*  LYMPHSABS 0.8  --   --   --   --   MONOABS 1.0  --   --   --   --   EOSABS 0.1  --   --   --   --   BASOSABS 0.0  --   --   --   --     Chemistries  Recent Labs  Lab 11/23/17 0400 11/24/17 0511 11/26/17 0406 11/27/17 0501 11/28/17 0347 11/29/17 0403  NA 140 136 135 135 134* 136  K 4.8 3.9 3.9 4.0 3.8 3.7  CL 105 96* 98* 94* 94* 95*  CO2 19* 24 24 22 23 22   GLUCOSE 96 78 84 68 67 62*  BUN 59* 29* 18 31* 15 23*  CREATININE 11.32* 7.27* 5.90* 7.99* 5.91* 7.85*  CALCIUM 9.1 8.7* 8.6* 8.8* 8.6* 8.8*  MG 2.0 1.9  --   --   --   --    ------------------------------------------------------------------------------------------------------------------ No results for input(s): CHOL, HDL, LDLCALC, TRIG, CHOLHDL, LDLDIRECT in the last 72 hours.  Lab Results  Component Value Date   HGBA1C 6.8 10/13/2017   ------------------------------------------------------------------------------------------------------------------ Recent Labs    11/28/17 1927  TSH 1.535   ------------------------------------------------------------------------------------------------------------------ Recent  Labs    11/28/17 1927  VITAMINB12 843    Coagulation profile Recent Labs  Lab 11/25/17 0349 11/26/17 0406 11/27/17 0501 11/28/17 0347 11/29/17 0403  INR 1.29 1.21 1.44 1.66 1.97    No results for input(s): DDIMER in the last 72 hours.  Cardiac Enzymes No results for input(s): CKMB, TROPONINI, MYOGLOBIN in the last 168 hours.  Invalid input(s): CK ------------------------------------------------------------------------------------------------------------------ No results found for: BNP   Roxan Hockey M.D on 11/29/2017 at 6:04 PM  Between 7am to 7pm - Pager - [phone]  After 7pm go to [email or web address] - password [email or web address]  [facility] -  Office  [phone]   Voice Recognition Viviann Spare dictation system was used to create this note, attempts have been made to correct errors. Please contact the author with questions and/or clarifications.

## 2017-11-30 DIAGNOSIS — Z4659 Encounter for fitting and adjustment of other gastrointestinal appliance and device: Secondary | ICD-10-CM

## 2017-11-30 DIAGNOSIS — Z09 Encounter for follow-up examination after completed treatment for conditions other than malignant neoplasm: Secondary | ICD-10-CM

## 2017-11-30 LAB — BASIC METABOLIC PANEL
ANION GAP: 14 (ref 5–15)
BUN: 33 mg/dL — AB (ref 6–20)
CALCIUM: 8.7 mg/dL — AB (ref 8.9–10.3)
CO2: 23 mmol/L (ref 22–32)
Chloride: 100 mmol/L — ABNORMAL LOW (ref 101–111)
Creatinine, Ser: 9.59 mg/dL — ABNORMAL HIGH (ref 0.61–1.24)
GFR, EST AFRICAN AMERICAN: 6 mL/min — AB (ref >60)
GFR, EST NON AFRICAN AMERICAN: 5 mL/min — AB (ref >60)
Glucose, Bld: 91 mg/dL (ref 65–99)
POTASSIUM: 3.6 mmol/L (ref 3.5–5.1)
Sodium: 137 mmol/L (ref 135–145)

## 2017-11-30 LAB — HEPARIN LEVEL (UNFRACTIONATED): HEPARIN UNFRACTIONATED: 0.55 [IU]/mL (ref 0.30–0.70)

## 2017-11-30 LAB — CBC
HEMATOCRIT: 34.8 % — AB (ref 39.0–52.0)
Hemoglobin: 11.5 g/dL — ABNORMAL LOW (ref 13.0–17.0)
MCH: 29.5 pg (ref 26.0–34.0)
MCHC: 33 g/dL (ref 30.0–36.0)
MCV: 89.2 fL (ref 78.0–100.0)
PLATELETS: 276 10*3/uL (ref 150–400)
RBC: 3.9 MIL/uL — AB (ref 4.22–5.81)
RDW: 17.1 % — AB (ref 11.5–15.5)
WBC: 5.4 10*3/uL (ref 4.0–10.5)

## 2017-11-30 LAB — GLUCOSE, CAPILLARY
GLUCOSE-CAPILLARY: 92 mg/dL (ref 65–99)
GLUCOSE-CAPILLARY: 72 mg/dL (ref 65–99)
Glucose-Capillary: 74 mg/dL (ref 65–99)
Glucose-Capillary: 102 mg/dL — ABNORMAL HIGH (ref 65–99)
Glucose-Capillary: 98 mg/dL (ref 65–99)
Glucose-Capillary: 104 mg/dL — ABNORMAL HIGH (ref 65–99)

## 2017-11-30 LAB — PHOSPHORUS
PHOSPHORUS: 3.3 mg/dL (ref 2.5–4.6)
Phosphorus: 6.8 mg/dL — ABNORMAL HIGH (ref 2.5–4.6)

## 2017-11-30 LAB — PROTIME-INR
INR: 2.26
Prothrombin Time: 24.8 seconds — ABNORMAL HIGH (ref 11.4–15.2)

## 2017-11-30 LAB — MAGNESIUM
MAGNESIUM: 2 mg/dL (ref 1.7–2.4)
Magnesium: 2.1 mg/dL (ref 1.7–2.4)

## 2017-11-30 MED ORDER — LIDOCAINE HCL (PF) 1 % IJ SOLN: 5.0000 mL | INTRAMUSCULAR | Status: DC | PRN

## 2017-11-30 MED ORDER — SODIUM CHLORIDE 0.9 % IV SOLN: 100.0000 mL | INTRAVENOUS | Status: DC | PRN

## 2017-11-30 MED ORDER — HEPARIN SODIUM (PORCINE) 1000 UNIT/ML DIALYSIS: 4000.0000 [IU] | Freq: Once | INTRAMUSCULAR | Status: DC

## 2017-11-30 MED ORDER — HEPARIN SODIUM (PORCINE) 1000 UNIT/ML DIALYSIS: 1000.0000 [IU] | INTRAMUSCULAR | Status: DC | PRN

## 2017-11-30 MED ORDER — ALTEPLASE 2 MG IJ SOLR: 2.0000 mg | Freq: Once | INTRAMUSCULAR | Status: DC | PRN

## 2017-11-30 MED ORDER — LIDOCAINE-PRILOCAINE 2.5-2.5 % EX CREA: 1.0000 "application " | TOPICAL_CREAM | CUTANEOUS | Status: DC | PRN

## 2017-11-30 MED ORDER — PENTAFLUOROPROP-TETRAFLUOROETH EX AERO: 1.0000 "application " | INHALATION_SPRAY | CUTANEOUS | Status: DC | PRN

## 2017-11-30 NOTE — Progress Notes (Signed)
Hypoglycemic Event  CBG: 54  Treatment: D50 IV 25 mL  Symptoms: None  Follow-up CBG: Time:2156 CBG Result:104  Possible Reasons for Event: Inadequate meal intake  Comments/MD notified:Opyd, MD    Rhilynn Preyer T Henretter Piekarski

## 2017-11-30 NOTE — Progress Notes (Signed)
PROGRESS NOTE  JOSHIA KITCHINGS UDJ:[id number] DOB: 13-Apr-1951 DOA: 11/21/2017 PCP: [name], MD  HPI/Recap of past 29 hours:  67 year old male who presents with right facial droopand altered mental status. He does have significant past medical history for end-stage renal disease on hemodialysis, atrial fibrillation, diastolic heart failure, history of CVA. Patient developed right facial droop,chills and altered mentation, acute presentation.About 7 days prior he was noted to have decreased po intake.On initial physical examination he was found febrile 104 F, with a septic shock physiology. He was poorly responsive.  He was admitted on 11/21/2017 with  concerns about sepsis, Patient was placed on broad-spectrum antibiotic therapy, prolonged hospital stay, the source of his sepsis continued to be not clear, suspected viral encephalitis. 67 year old male with progressive encephalopathy after presentation most consistent with sepsis.   11/30/2017: Patient seen and examined with his wife at his bedside.  He is somnolent and does not respond to commands.  Video EEG ongoing, day 2 of intensive EEG monitoring with no record of any clinical subclinical seizures.  Neurology following.  Highly appreciated.  Assessment/Plan: Principal Problem:   Toxic metabolic encephalopathy Active Problems:   Severe sepsis (HCC)   Septic shock (HCC)   Pulmonary edema   Pressure injury of skin  Metabolic encephalopathy with suspected viral encephalitis CSF WBC 0 Persistent encephalopathy with no seizure activity detected on EEG Neurology following.  Highly appreciated.  Continue thiamine  Chronic A. fib, stable Rate controlled On Coumadin at home INR 2.26 Currently on heparin drip Continue current medications C/w amiodarone  Type 2 diabetes, stable insulin sliding scale held due to hypoglycemia Avoid hypoglycemia NG Tube feeding in place  Dysphagia, possibly from encephalopathy/previous  CVA NG tube feeding in place Elevate head of bed 35 degrees Aspiration precautions  History of CVA and seizures Video EEG ongoing Continue current management  ESRD on HD Tuesday Thursday Saturday Nephrology following  Chronic depression Continue Seroquel  Anemia of chronic disease Nephrology following Hemoglobin stable  GERD C/w pepcid   Code Status: full   Family Communication: wife at bedside  Disposition Plan: Home when clinically stable.   Consultants:  Neurology  Procedures:  None  Antimicrobials:    DVT prophylaxis:  heparin drip   Objective: Vitals:   11/30/17 1330 11/30/17 1345 11/30/17 1400 11/30/17 1415  BP: (!) 83/30 (!) 111/56 (!) 132/49 (!) 123/40  Pulse: 76 76 77 79  Resp:      Temp:      TempSrc:      SpO2:      Weight:      Height:        Intake/Output Summary (Last 24 hours) at 11/30/2017 1426 Last data filed at 11/30/2017 0830 Gross per 24 hour  Intake 760.5 ml  Output -  Net 760.5 ml   Filed Weights   11/28/17 1444 11/30/17 0500 11/30/17 1250  Weight: 89.2 kg (196 lb 10.4 oz) 93.2 kg (205 lb 7.5 oz) 93 kg (205 lb 0.4 oz)    Exam:  . General: 67 y.o. year-old male well developed well nourished in no acute distress.  Somnolent. Does not follow commands. NG tube in place. . Cardiovascular: Regular rate and rhythm with no rubs or gallops.  No thyromegaly or JVD noted.   [name] Respiratory: Clear to auscultation with no wheezes or rales. Good inspiratory effort. . Abdomen: Soft nontender nondistended with normal bowel sounds x4 quadrants. . Musculoskeletal: B/L lower extremity amputee. . Skin: No ulcerative lesions noted or rashes, .  Psychiatry: unable to assess due to encephalopathy   Data Reviewed: CBC: Recent Labs  Lab 11/25/17 0837 11/26/17 0406 11/27/17 0501 11/28/17 0347 11/29/17 0403 11/30/17 0517  WBC 5.7 6.1 6.8 6.1 6.2 5.4  NEUTROABS 3.8  --   --   --   --   --   HGB 11.0* 12.2* 12.4* 12.0* 12.4* 11.5*  HCT  33.4* 37.0* 37.9* 35.7* 37.2* 34.8*  MCV 89.3 89.4 89.6 89.3 90.1 89.2  PLT 192 181 205 320 276 324   Basic Metabolic Panel: Recent Labs  Lab 11/24/17 0511 11/26/17 0406 11/27/17 0501 11/28/17 0347 11/29/17 0403 11/30/17 0517  NA 136 135 135 134* 136 137  K 3.9 3.9 4.0 3.8 3.7 3.6  CL 96* 98* 94* 94* 95* 100*  CO2 24 24 22 23 22 23   GLUCOSE 78 84 68 67 62* 91  BUN 29* 18 31* 15 23* 33*  CREATININE 7.27* 5.90* 7.99* 5.91* 7.85* 9.59*  CALCIUM 8.7* 8.6* 8.8* 8.6* 8.8* 8.7*  MG 1.9  --   --   --   --  2.0  PHOS 3.2  --   --   --   --  6.8*   GFR: Estimated Creatinine Clearance: 8.7 mL/min (A) (by C-G formula based on SCr of 9.59 mg/dL (H)). Liver Function Tests: No results for input(s): AST, ALT, ALKPHOS, BILITOT, PROT, ALBUMIN in the last 168 hours. No results for input(s): LIPASE, AMYLASE in the last 168 hours. Recent Labs  Lab 11/28/17 1927  AMMONIA 16   Coagulation Profile: Recent Labs  Lab 11/26/17 0406 11/27/17 0501 11/28/17 0347 11/29/17 0403 11/30/17 0517  INR 1.21 1.44 1.66 1.97 2.26   Cardiac Enzymes: No results for input(s): CKTOTAL, CKMB, CKMBINDEX, TROPONINI in the last 168 hours. BNP (last 3 results) No results for input(s): PROBNP in the last 8760 hours. HbA1C: No results for input(s): HGBA1C in the last 72 hours. CBG: Recent Labs  Lab 11/29/17 2156 11/30/17 0059 11/30/17 0416 11/30/17 0804 11/30/17 1206  GLUCAP 104* 74 92 72 102*   Lipid Profile: No results for input(s): CHOL, HDL, LDLCALC, TRIG, CHOLHDL, LDLDIRECT in the last 72 hours. Thyroid Function Tests: Recent Labs    11/28/17 1927  TSH 1.535   Anemia Panel: Recent Labs    11/28/17 1927  VITAMINB12 843   Urine analysis:    Component Value Date/Time   COLORURINE YELLOW 05/15/2013 0007   APPEARANCEUR CLEAR 05/15/2013 0007   LABSPEC 1.014 05/15/2013 0007   PHURINE 8.5 (H) 05/15/2013 0007   GLUCOSEU 250 (A) 05/15/2013 0007   GLUCOSEU 500 (?) 11/06/2009 0948   HGBUR  NEGATIVE 05/15/2013 0007   BILIRUBINUR NEGATIVE 05/15/2013 0007   KETONESUR NEGATIVE 05/15/2013 0007   PROTEINUR >300 (A) 05/15/2013 0007   UROBILINOGEN 0.2 05/15/2013 0007   NITRITE NEGATIVE 05/15/2013 0007   LEUKOCYTESUR NEGATIVE 05/15/2013 0007   Sepsis Labs: @LABRCNTIP (procalcitonin:4,lacticidven:4)  ) Recent Results (from the past 240 hour(s))  Culture, blood (single)     Status: None   Collection Time: 11/21/17 10:58 PM  Result Value Ref Range Status   Specimen Description BLOOD RIGHT ANTECUBITAL  Final   Special Requests   Final    BOTTLES DRAWN AEROBIC AND ANAEROBIC Blood Culture adequate volume   Culture   Final    NO GROWTH 5 DAYS Performed at [facility], [place] [address], [place], [place] [address]    Report Status 11/26/2017 FINAL  Final  MRSA PCR Screening     Status: None  Collection Time: 11/22/17  4:28 AM  Result Value Ref Range Status   MRSA by PCR NEGATIVE NEGATIVE Final    Comment:        The GeneXpert MRSA Assay (FDA approved for NASAL specimens only), is one component of a comprehensive MRSA colonization surveillance program. It is not intended to diagnose MRSA infection nor to guide or monitor treatment for MRSA infections. Performed at [facility], [place] [address] [place], [place] [address]   Respiratory Panel by PCR     Status: None   Collection Time: 11/22/17  4:31 AM  Result Value Ref Range Status   Adenovirus NOT DETECTED NOT DETECTED Final   Coronavirus 229E NOT DETECTED NOT DETECTED Final   Coronavirus HKU1 NOT DETECTED NOT DETECTED Final   Coronavirus NL63 NOT DETECTED NOT DETECTED Final   Coronavirus OC43 NOT DETECTED NOT DETECTED Final   Metapneumovirus NOT DETECTED NOT DETECTED Final   Rhinovirus / Enterovirus NOT DETECTED NOT DETECTED Final   Influenza A NOT DETECTED NOT DETECTED Final   Influenza B NOT DETECTED NOT DETECTED Final   Parainfluenza Virus 1 NOT DETECTED NOT DETECTED Final   Parainfluenza Virus 2 NOT  DETECTED NOT DETECTED Final   Parainfluenza Virus 3 NOT DETECTED NOT DETECTED Final   Parainfluenza Virus 4 NOT DETECTED NOT DETECTED Final   Respiratory Syncytial Virus NOT DETECTED NOT DETECTED Final   Bordetella pertussis NOT DETECTED NOT DETECTED Final   Chlamydophila pneumoniae NOT DETECTED NOT DETECTED Final   Mycoplasma pneumoniae NOT DETECTED NOT DETECTED Final  Culture, blood (single)     Status: None   Collection Time: 11/22/17  6:08 AM  Result Value Ref Range Status   Specimen Description BLOOD LEFT ANTECUBITAL  Final   Special Requests   Final    BOTTLES DRAWN AEROBIC ONLY Blood Culture adequate volume   Culture   Final    NO GROWTH 5 DAYS Performed at [facility], [address], [place], [place] [address]    Report Status 11/27/2017 FINAL  Final  Culture, blood (single)     Status: None   Collection Time: 11/22/17  6:18 AM  Result Value Ref Range Status   Specimen Description BLOOD LEFT HAND  Final   Special Requests   Final    BOTTLES DRAWN AEROBIC ONLY Blood Culture adequate volume   Culture   Final    NO GROWTH 5 DAYS Performed at [facility], [place] [address], [place], [place] [address]    Report Status 11/27/2017 FINAL  Final  CSF culture with Stat gram stain     Status: None   Collection Time: 11/25/17 11:16 AM  Result Value Ref Range Status   Specimen Description CSF  Final   Special Requests NONE  Final   Gram Stain   Final    CYTOSPIN SMEAR WBC PRESENT, PREDOMINANTLY MONONUCLEAR NO ORGANISMS SEEN    Culture   Final    NO GROWTH 3 DAYS Performed at [facility], [place] [address], [place], [place] [address]    Report Status 11/28/2017 FINAL  Final      Studies: No results found.  Scheduled Meds: . amiodarone  200 mg Per Tube Daily  . atorvastatin  40 mg Per Tube QHS  . feeding supplement (NEPRO CARB STEADY)  1,000 mL Per Tube Q24H  . feeding supplement (PRO-STAT SUGAR FREE 64)  30 mL Per Tube BID  . heparin  4,000 Units  Dialysis Once in dialysis  .  latanoprost  1 drop Both Eyes QHS  . LORazepam  1 mg Intravenous Once  . mouth rinse  15 mL Mouth Rinse BID  . midodrine  5 mg Per Tube BID WC  . QUEtiapine  12.5 mg Per Tube QHS  . sodium chloride flush  3 mL Intravenous Q12H    Continuous Infusions: . sodium chloride    . sodium chloride    . sodium chloride    . famotidine (PEPCID) IV Stopped (11/30/17 1049)  . heparin 1,250 Units/hr (11/30/17 8757)  . thiamine injection Stopped (11/30/17 1200)     LOS: 9 days     Kayleen Memos, MD [facility] Pager [phone]  If 7PM-7AM, please contact night-coverage [email or web address] Password [email or web address] 11/30/2017, 2:26 PM

## 2017-11-30 NOTE — Care Management Note (Addendum)
Case Management Note  Patient Details  Name: Oscar Castillo MRN: [id number] Date of Birth: 1951-05-20  Subjective/Objective:   Admitted with AMS, sepsis ( unknown etiology). Hx of ESRD on HD, Afib (on coumadin), HTN, HCV, GERD, Bilateral BKA's, Dm, CVA, CHF, Bipolar disorder, history renal stones, history C diff, Anemia, Cholelithiasis.From home with wife.   Oscar Castillo (Spouse)      [phone]      PCP;    [name]   Action/Plan:  Pt NPO, cortrak inplace with tube feeding.... SLP to re-evaluate...PT evaluation pending. NCM following for disposition needs.  Expected Discharge Date:                  Expected Discharge Plan:     In-House Referral:     Discharge planning Services  CM Consult  Post Acute Care Choice:    Choice offered to:     DME Arranged:    DME Agency:     HH Arranged:    HH Agency:     Status of Service:  In process, will continue to follow  If discussed at Long Length of Stay Meetings, dates discussed:    Additional Comments:  Sharin Mons, RN 11/30/2017, 12:56 PM

## 2017-11-30 NOTE — Progress Notes (Addendum)
Oscar Castillo Progress Note   Subjective: Less responsive today. Says "um hmm" to questions, does not open eyes. Continuous EEG in progress. TF has been started.   Objective Vitals:   11/29/17 2051 11/30/17 0419 11/30/17 0500 11/30/17 0940  BP: (!) 142/54 (!) 124/56  (!) 125/46  Pulse: 81 78  77  Resp: 16 18    Temp: 99.1 F (37.3 C) 98.6 F (37 C)  98.5 F (36.9 C)  TempSrc: Oral Oral  Axillary  SpO2: 100% 98%  98%  Weight:   93.2 kg (205 lb 7.5 oz)   Height:       Physical Exam General: Chronically ill appearing male in NAD Heart: S1,S2, RRR Lungs: CTAB Sl decreased in bases.  Abdomen: active BS ND Extremities: Bilateral BKA no stump edema Neuro: Not following commands, responds to questions with "Um hmm".  Dialysis Access: [facility].    Additional Objective Labs: Basic Metabolic Panel: Recent Labs  Lab 11/24/17 0511  11/28/17 0347 11/29/17 0403 11/30/17 0517  NA 136   < > 134* 136 137  K 3.9   < > 3.8 3.7 3.6  CL 96*   < > 94* 95* 100*  CO2 24   < > 23 22 23   GLUCOSE 78   < > 67 62* 91  BUN 29*   < > 15 23* 33*  CREATININE 7.27*   < > 5.91* 7.85* 9.59*  CALCIUM 8.7*   < > 8.6* 8.8* 8.7*  PHOS 3.2  --   --   --  6.8*   < > = values in this interval not displayed.   Liver Function Tests: No results for input(s): AST, ALT, ALKPHOS, BILITOT, PROT, ALBUMIN in the last 168 hours. No results for input(s): LIPASE, AMYLASE in the last 168 hours. CBC: Recent Labs  Lab 11/25/17 0837 11/26/17 0406 11/27/17 0501 11/28/17 0347 11/29/17 0403 11/30/17 0517  WBC 5.7 6.1 6.8 6.1 6.2 5.4  NEUTROABS 3.8  --   --   --   --   --   HGB 11.0* 12.2* 12.4* 12.0* 12.4* 11.5*  HCT 33.4* 37.0* 37.9* 35.7* 37.2* 34.8*  MCV 89.3 89.4 89.6 89.3 90.1 89.2  PLT 192 181 205 320 276 276   Blood Culture    Component Value Date/Time   SDES CSF 11/25/2017 1116   SPECREQUEST NONE 11/25/2017 1116   CULT  11/25/2017 1116    NO GROWTH 3 DAYS Performed at  [place] [address] [place], [place] [address]    REPTSTATUS 11/28/2017 FINAL 11/25/2017 1116    Cardiac Enzymes: No results for input(s): CKTOTAL, CKMB, CKMBINDEX, TROPONINI in the last 168 hours. CBG: Recent Labs  Lab 11/29/17 2047 11/29/17 2156 11/30/17 0059 11/30/17 0416 11/30/17 0804  GLUCAP 54* 104* 74 92 72   Iron Studies: No results for input(s): IRON, TIBC, TRANSFERRIN, FERRITIN in the last 72 hours. @lablastinr3 @ Studies/Results: Dg Abd Portable 1v  Result Date: 11/29/2017 CLINICAL DATA:  Feeding tube placement. EXAM: PORTABLE ABDOMEN - 1 VIEW COMPARISON:  CT abdomen pelvis dated May 06, 2017. FINDINGS: Feeding tube tip in the gastric body. There are few mildly dilated loops of small bowel in the central abdomen. Large amount of stool in the rectum. No acute osseous abnormality. IMPRESSION: 1. Feeding tube tip in the gastric body. 2. Non-specific bowel gas pattern may reflect mild ileus. Electronically Signed   By: Titus Dubin M.D.   On: 11/29/2017 11:19   Medications: .  sodium chloride    . famotidine (PEPCID) IV 20 mg (11/30/17 1006)  . heparin 1,250 Units/hr (11/30/17 2482)  . thiamine injection 0 mg (11/29/17 2303)   . amiodarone  200 mg Per Tube Daily  . atorvastatin  40 mg Per Tube QHS  . feeding supplement (NEPRO CARB STEADY)  1,000 mL Per Tube Q24H  . feeding supplement (PRO-STAT SUGAR FREE 64)  30 mL Per Tube BID  . latanoprost  1 drop Both Eyes QHS  . LORazepam  1 mg Intravenous Once  . mouth rinse  15 mL Mouth Rinse BID  . midodrine  5 mg Per Tube BID WC  . QUEtiapine  12.5 mg Per Tube QHS  . sodium chloride flush  3 mL Intravenous Q12H     Dialysis Orders: TTS at [facility] 4h 91.5kg 2/2.5 bath p4 Hep 4000+ 2000 midrun Left chest tdc Venofer 50mg  IV q week  Impression: 1 Metabolic encephalopathy/possbile viral encephalitis: Presented with AMS/Sepsis. No clear etiology. Neurology following.  ABX dc'd 11/28/17 per ID.  Cx's  negative resp pcr neg and csf unremarkable. AMS still variable, possibly improving. 2 ESRD HD T,Th,S - using tunneled hd cath, blood cx's negative. HD today. No heparin-on heparin gtt. K+ 3.6 use 4.0 K bath 3 Hypotension/Volume: Chronic hypotension on midodrine. HD 11/27/17 Pre wt 92.3 kg net UF 1.0 liter Post wt 88.9 kg. Now under OP EDW. BP stable. No stump edema but trace edema upper thighs and hand. Lower EDW-UFG 2.5-3 liters at tolerated.  4 HGB 11.5 No ESA. Follow HGB.  5 Afib-Rate controlled on amiodarone, on heparin gtt per pharmacy 6 T2DM:  per primary 7 CKD BMD: Renal function panel with HD tomorrow.  8 hx cva and/or seizures - started on keppra here, mri head negative. EEG in progress at bedside.    Rita H. Brown NP-C 11/30/2017, 10:41 AM  [facility] [phone]    \ I have seen and examined this patient and agree with plan and assessment in the above note with renal recommendations/intervention highlighted.  Pt is minimally responsive today.  EEG still in place.  For HD today. Oscar John A Angelyse Heslin,MD 11/30/2017 1:04 PM

## 2017-11-30 NOTE — Consult Note (Signed)
Subjective: Patient in bed sleeping. LTM continues to run. NAD. NGT present and tube feeds running.  Exam: Vitals:   11/30/17 0419 11/30/17 0940  BP: (!) 124/56 (!) 125/46  Pulse: 78 77  Resp: 18   Temp: 98.6 F (37 C) 98.5 F (36.9 C)  SpO2: 98% 98%    Physical Exam   HEENT-  Normocephalic, no lesions, without obvious abnormality.  Normal external eye and conjunctiva.   Cardiovascular- S1-S2 audible, pulses palpable throughout   Lungs-.  Saturations within normal limits Extremities- Warm, dry and intact Musculoskeletal-no joint tenderness, deformity or swelling Skin-warm and dry, no hyperpigmentation, vitiligo, or suspicious lesions  Neuro:  Mental Status: Drowsy oriented to person.  Speech slow when he does respond.  Able to follow simple commands. Cranial Nerves: II: Will open eyes, and squeeze them shut, did not blink to threat III,IV, VI: ptosis not present,  pupils equal, round, reactive to light and accommodation V,VII: smile symmetric, facial light touch sensation normal bilaterally VIII: hearing normal bilaterally IX,X, XI,XII: uncooperative with exam Motor: Right : Upper extremity   uncooperative with exam Left:     Upper extremity   uncooperative with exam  Lower extremity   uncooperative with exam  Lower extremity  uncooperative with exam Tone and bulk:normal tone throughout; no atrophy noted withdraws to painful stimuli Sensory: withdraws to painful stimuli in arms and stumps. Deep Tendon Reflexes: 2+ and symmetric throughout Plantars: BKA Cerebellar:  Uncooperative with exam     Medications:  Scheduled: . amiodarone  200 mg Per Tube Daily  . atorvastatin  40 mg Per Tube QHS  . feeding supplement (NEPRO CARB STEADY)  1,000 mL Per Tube Q24H  . feeding supplement (PRO-STAT SUGAR FREE 64)  30 mL Per Tube BID  . latanoprost  1 drop Both Eyes QHS  . LORazepam  1 mg Intravenous Once  . mouth rinse  15 mL Mouth Rinse BID  . midodrine  5 mg Per Tube BID WC   . QUEtiapine  12.5 mg Per Tube QHS  . sodium chloride flush  3 mL Intravenous Q12H   Continuous: . sodium chloride    . famotidine (PEPCID) IV Stopped (11/29/17 1200)  . heparin 1,250 Units/hr (11/30/17 5784)  . thiamine injection Stopped (11/29/17 2303)   ONG:EXBMWU chloride, acetaminophen (TYLENOL) oral liquid 160 mg/5 mL, acetaminophen **OR** acetaminophen, albuterol, ondansetron (ZOFRAN) IV  Pertinent Labs/Diagnostics:   Dg Abd Portable 1v  Result Date: 11/29/2017 CLINICAL DATA:  Feeding tube placement. EXAM: PORTABLE ABDOMEN - 1 VIEW COMPARISON:  CT abdomen pelvis dated May 06, 2017. FINDINGS: Feeding tube tip in the gastric body. There are few mildly dilated loops of small bowel in the central abdomen. Large amount of stool in the rectum. No acute osseous abnormality. IMPRESSION: 1. Feeding tube tip in the gastric body. 2. Non-specific bowel gas pattern may reflect mild ileus. Electronically Signed   By: Titus Dubin M.D.   On: 11/29/2017 11:19   EEG day 2 clinical interpretation: This day 2 of intensive EEG monitoring with simultaneous video monitoring did not record any clinical or subclinical seizures.  Background activities were abnormal due to background activity slowing as discussed above.  These findings suggestive of moderate encephalopathy of nonspecific etiologies including toxic metabolic pharmacological multifocal degenerative etiologies.  Very few poorly formed sharp waves in the left posterior temporal/parietal region present which could be indicative of some degree of cortical irritability in the region however that was not a definitive finding.    Examination, interval  history and pertinent labs/testing documented by Laurey Morale, NP-C [facility] [phone]   Assessment: 67 year old male with progressive encephalopathy after presentation most consistent with sepsis. 1. His exam initially improved and then subsequently worsened which does  raise the possibility of antibiotic associated encephalopathy.Of note, acyclovir can be associated with encephalopathyand there is a very low suspicion for herpes and therefore this is been stopped.Vanc/Zosyn has also been stopped. 2. Of note, he was started on Keppra on first day of admission, but the description of the event sounds like rigors, not seizure. Keppra was discontinued on 4/29. Following Keppra discontinuation, there has been no seizure ocurrence on EEG.  3.EEG day 2 not showing evidence for seizure and no electrographic correlate on LTM EEG to changes in mental status during bedside evaluation. Will discontinue EEG tomorrow (Wednesday).   Recommendations: 1) Neurology will continue to follow until tomorrow, then most likely will sign off.  2) Will discontinue LTM EEG on Wednesday unless there is seizure recurrence overnight.   Electronically signed: Dr. Kerney Elbe 11/30/2017, 9:58 AM

## 2017-11-30 NOTE — Progress Notes (Signed)
SLP Cancellation Note  Patient Details Name: CANDY ZIEGLER MRN: [id number] DOB: 1950-10-22   Cancelled treatment:       Reason Eval/Treat Not Completed: Fatigue/lethargy limiting ability to participate. Still minimally responsive, getting dialysis and EEG. Has a Cortrak. Will f/u for readiness.    Maksymilian Mabey, Katherene Ponto 11/30/2017, 11:51 AM

## 2017-11-30 NOTE — Procedures (Signed)
  Electroencephalogram report- LTM    Data acquisition: 10-20 electrode placement.  Additional T1, T2, and EKG electrodes; 26 channel digital referential acquisition reformatted to 18 channel/7 channel coronal bipolar     Spike detection: ON     Seizure detection: ON   Beginning time: 11/29/2017 at 07 35 am  Ending time: 11/30/2017 at 07 55 am   CPT: 95951 Day of study: Day 2   This  intensive EEG monitoring with simultaneous video monitoring was performed for this patient with spells confusion and right facial weakness to rule out clinical and subclinical electrographic seizures to explain his symptoms. Medications: As per EMR  Day 1 There was no pushbutton activations events during this recording.  Automated spike detection program did not detect any spikes. Seizure detection program did not detect any seizures .   There was no clinical or subclinical seizures present.  Background activities marked by 2 to 4 cps background activity slowing with more prominent 1.5 to 2 cps delta slowing distributed more anteriorly and in continuous fashion.  Background activity at times reach 5 to 6 cps with posterior dominance.  At times delta slowing carry triphasic morphology however triphasic waves were not a prominent activity throughout this recording.  There was no interhemispheric asymmetries, focal abnormalities or epileptiform discharges present.  No clinical or subclinical seizures.  Day 2: Mild background activity slowing in with 5 to 6 cps posterior dominant rhythm and prominent anterior dominant delta slowing.  At times triphasic waves present but not a prominent findings on this study.  On few occasions poorly formed sharp waves present particularly in the left posterior temporal/parietal region.   Clinical interpretation: This day 2 of intensive EEG monitoring with simultaneous video monitoring did not record any clinical subclinical seizures.  Background activities were abnormal due to  background activity slowing as discussed above.  These findings suggestive of moderate encephalopathy of nonspecific etiologies including toxic metabolic pharmacological multifocal degenerative etiologies.  Very few poorly formed sharp waves in the left posterior temporal/parietal region present which could be indicative of some degree of cortical irritability in the region however that was not a definitive findings.  Clinical correlation is advised.

## 2017-11-30 NOTE — Progress Notes (Signed)
Oscar Castillo for Heparin Indication: atrial fibrillation  Allergies  Allergen Reactions  . Ceftriaxone Sodium In Dextrose Anaphylaxis  . Ciprofloxacin Other (See Comments)    UNSPECIFIED PAIN  . Nsaids Other (See Comments)    HISTORY OF ULCER  . Penicillins Swelling    SWELLING REACTION UNSPECIFIED  [ PATIENT WITH HX OF ANAPHYLAXIS TO CEFTRIAXONE ] 11/26/17:  pt has received 3 doses of zosyn in past (04/2017.) No allergy tor adverse event to zosyn noted at that time.     . Sulfa Antibiotics Swelling    SWELLING REACTION UNSPECIFIED     Patient Measurements: Height: 5\' 10"  (177.8 cm)(on 10/25/2017) Weight: 205 lb 0.4 oz (93 kg) IBW/kg (Calculated) : 73 Heparin Dosing Weight: 88.9 kg  Vital Signs: Temp: 98.1 F (36.7 C) (05/07 1300) Temp Source: Oral (05/07 1300) BP: 100/29 (05/07 1315) Pulse Rate: 76 (05/07 1315)  Labs: Recent Labs    11/28/17 0347 11/29/17 0403 11/30/17 0517  HGB 12.0* 12.4* 11.5*  HCT 35.7* 37.2* 34.8*  PLT 320 276 276  LABPROT 19.4* 22.3* 24.8*  INR 1.66 1.97 2.26  HEPARINUNFRC 0.57 0.39 0.55  CREATININE 5.91* 7.85* 9.59*    Estimated Creatinine Clearance: 8.7 mL/min (A) (by C-G formula based on SCr of 9.59 mg/dL (H)).   Medical History: Past Medical History:  Diagnosis Date  . Anemia   . Antral ulcer 2014   small  . BENIGN PROSTATIC HYPERTROPHY   . Bipolar disorder ([place])    "sometimes" (10/07/2016)  . CHOLELITHIASIS   . Chronic combined systolic and diastolic CHF (congestive heart failure) ([place])   . Complication of anesthesia    wife states pt had trouble waking up with in Nov., 2014  . CVA (cerebral vascular accident) ([place]) 07/2007   No residual effect  . DEPRESSION   . DIABETES MELLITUS, TYPE II    diet control.  No medication  since November 2015  . ERECTILE DYSFUNCTION   . ESRD on hemodialysis ([place])    ESRD due to DM/HTN. .  HD TTS at [facility] on [address].  [name] GERD   . GI bleed    due  to gastritis, discharged 10/02/16/notes 10/07/2016  . Hepatitis C    C - has been treated  . History of Clostridium difficile   . History of kidney stones   . HYPERTENSION   . LBBB (left bundle branch block)   . Morbid obesity ([place])   . PAF (paroxysmal atrial fibrillation) ([place])    a. Dx 12/2015.    Assessment: 67 year old male on Coumadin 2.5mg  daily except 1.25mg  on Sat PTA for Afib. Held on admission for LP (last dose PTA) s/p reversal with Vitamin K 5mg  IVx1. Received Coumadin x 1 dose 5/2 then held again given NPO status.  Coretrack placed 5/6 and pt has started on tube feedings.    Heparin level is therapeutic on 1250 units/hr  Goal of Therapy:  INR 2-3 Heparin level 0.3-0.7 units/ml Monitor platelets by anticoagulation protocol: Yes   Plan:  Continue heparin gtt at 1,250 units/hr Monitor daily INR / heparin level, CBC, s/s of bleed F/U restart of Coumadin when able.  Can be given via tube if desired.  Unclear of current plan and if further procedures needed.  [employer], Pharm.D., BCPS Clinical Pharmacist Pager: [phone] Clinical phone for 11/30/2017 from 8:30-4:00 is [phone]. After 4pm, please call Main Rx [phone]) for assistance. 11/30/2017 1:28 PM

## 2017-12-01 LAB — PROTIME-INR
INR: 1.86
PROTHROMBIN TIME: 21.2 s — AB (ref 11.4–15.2)

## 2017-12-01 LAB — GLUCOSE, CAPILLARY
GLUCOSE-CAPILLARY: 139 mg/dL — AB (ref 65–99)
GLUCOSE-CAPILLARY: 149 mg/dL — AB (ref 65–99)
GLUCOSE-CAPILLARY: 131 mg/dL — AB (ref 65–99)
GLUCOSE-CAPILLARY: 115 mg/dL — AB (ref 65–99)
Glucose-Capillary: 132 mg/dL — ABNORMAL HIGH (ref 65–99)
Glucose-Capillary: 135 mg/dL — ABNORMAL HIGH (ref 65–99)

## 2017-12-01 LAB — CBC
HEMATOCRIT: 37.8 % — AB (ref 39.0–52.0)
Hemoglobin: 12.2 g/dL — ABNORMAL LOW (ref 13.0–17.0)
MCH: 29 pg (ref 26.0–34.0)
MCHC: 32.3 g/dL (ref 30.0–36.0)
MCV: 89.8 fL (ref 78.0–100.0)
PLATELETS: 318 10*3/uL (ref 150–400)
RBC: 4.21 MIL/uL — ABNORMAL LOW (ref 4.22–5.81)
RDW: 17.3 % — AB (ref 11.5–15.5)
WBC: 6.7 10*3/uL (ref 4.0–10.5)

## 2017-12-01 LAB — COMPREHENSIVE METABOLIC PANEL
ALT: 8 U/L — ABNORMAL LOW (ref 17–63)
ANION GAP: 13 (ref 5–15)
AST: 14 U/L — ABNORMAL LOW (ref 15–41)
Albumin: 2.9 g/dL — ABNORMAL LOW (ref 3.5–5.0)
Alkaline Phosphatase: 39 U/L (ref 38–126)
BUN: 19 mg/dL (ref 6–20)
CO2: 24 mmol/L (ref 22–32)
Calcium: 9.2 mg/dL (ref 8.9–10.3)
Chloride: 99 mmol/L — ABNORMAL LOW (ref 101–111)
Creatinine, Ser: 6.62 mg/dL — ABNORMAL HIGH (ref 0.61–1.24)
GFR, EST AFRICAN AMERICAN: 9 mL/min — AB (ref >60)
GFR, EST NON AFRICAN AMERICAN: 8 mL/min — AB (ref >60)
Glucose, Bld: 112 mg/dL — ABNORMAL HIGH (ref 65–99)
POTASSIUM: 3.8 mmol/L (ref 3.5–5.1)
Sodium: 136 mmol/L (ref 135–145)
TOTAL PROTEIN: 7.8 g/dL (ref 6.5–8.1)
Total Bilirubin: 0.6 mg/dL (ref 0.3–1.2)

## 2017-12-01 LAB — PHOSPHORUS
PHOSPHORUS: 4.8 mg/dL — AB (ref 2.5–4.6)
Phosphorus: 4.3 mg/dL (ref 2.5–4.6)

## 2017-12-01 LAB — HEPARIN LEVEL (UNFRACTIONATED): HEPARIN UNFRACTIONATED: 0.64 [IU]/mL (ref 0.30–0.70)

## 2017-12-01 LAB — MAGNESIUM
MAGNESIUM: 2.1 mg/dL (ref 1.7–2.4)
Magnesium: 2 mg/dL (ref 1.7–2.4)

## 2017-12-01 MED ORDER — ATORVASTATIN CALCIUM 40 MG PO TABS
40.0000 mg | ORAL_TABLET | Freq: Every day | ORAL | Status: DC
  Administered 2017-12-01: 40 mg
  Filled 2017-12-01: qty 1

## 2017-12-01 NOTE — Progress Notes (Addendum)
[place] for Heparin Indication: atrial fibrillation  Allergies  Allergen Reactions  . Ceftriaxone Sodium In Dextrose Anaphylaxis  . Ciprofloxacin Other (See Comments)    UNSPECIFIED PAIN  . Nsaids Other (See Comments)    HISTORY OF ULCER  . Penicillins Swelling    SWELLING REACTION UNSPECIFIED  [ PATIENT WITH HX OF ANAPHYLAXIS TO CEFTRIAXONE ] 11/26/17:  pt has received 3 doses of zosyn in past (04/2017.) No allergy tor adverse event to zosyn noted at that time.     . Sulfa Antibiotics Swelling    SWELLING REACTION UNSPECIFIED     Patient Measurements: Height: 5\' 10"  (177.8 cm)(on 10/25/2017) Weight: 201 lb 1 oz (91.2 kg) IBW/kg (Calculated) : 73 Heparin Dosing Weight: 88.9 kg  Vital Signs: Temp: 98.2 F (36.8 C) (05/08 0612) Temp Source: Oral (05/08 0612) BP: 113/56 (05/08 0715) Pulse Rate: 77 (05/08 0715)  Labs: Recent Labs    11/29/17 0403 11/30/17 0517 12/01/17 0439  HGB 12.4* 11.5* 12.2*  HCT 37.2* 34.8* 37.8*  PLT 276 276 318  LABPROT 22.3* 24.8* 21.2*  INR 1.97 2.26 1.86  HEPARINUNFRC 0.39 0.55 0.64  CREATININE 7.85* 9.59* 6.62*    Estimated Creatinine Clearance: 12.5 mL/min (A) (by C-G formula based on SCr of 6.62 mg/dL (H)).   Medical History: Past Medical History:  Diagnosis Date  . Anemia   . Antral ulcer 2014   small  . BENIGN PROSTATIC HYPERTROPHY   . Bipolar disorder ([place])    "sometimes" (10/07/2016)  . CHOLELITHIASIS   . Chronic combined systolic and diastolic CHF (congestive heart failure) ([place])   . Complication of anesthesia    wife states pt had trouble waking up with in Nov., 2014  . CVA (cerebral vascular accident) ([place]) 07/2007   No residual effect  . DEPRESSION   . DIABETES MELLITUS, TYPE II    diet control.  No medication  since November 2015  . ERECTILE DYSFUNCTION   . ESRD on hemodialysis ([place])    ESRD due to DM/HTN. .  HD TTS at [facility] on [address].  [name] GERD   . GI bleed    due to gastritis, discharged 10/02/16/notes 10/07/2016  . Hepatitis C    C - has been treated  . History of Clostridium difficile   . History of kidney stones   . HYPERTENSION   . LBBB (left bundle branch block)   . Morbid obesity ([place])   . PAF (paroxysmal atrial fibrillation) ([place])    a. Dx 12/2015.    Assessment: 67 year old male on Coumadin 2.5mg  daily except 1.25mg  on Sat PTA for Afib presenting as code stroke, presentation felt to be most consistent with sepsis per Neuro. Held on admission for LP (last dose PTA), now s/p reversal with Vitamin K 5mg  IVx1. Received Coumadin x 1 dose 5/2 then held again given NPO status. Coretrack placed 5/6 and pt has started on tube feedings. INR down to 1.89.   Heparin level remains therapeutic on 1250 units/hr but trending up to 0.64. CBC stable. No bleed documented.  Goal of Therapy:  INR 2-3 Heparin level 0.3-0.7 units/ml Monitor platelets by anticoagulation protocol: Yes   Plan:  Reduce heparin gtt slightly to 1200 units/hr with trend up in levels towards top of therapeutic range Monitor daily INR / heparin level, CBC, s/s of bleed F/U restart of Coumadin when able.  Can be given via tube if desired.  Unclear of current plan and if further procedures needed.  Elicia Lamp, PharmD, BCPS Clinical Pharmacist Clinical phone for 12/01/2017 until 3:30pm: [phone] If after 3:30pm, please call main pharmacy at: [phone] 12/01/2017 8:32 AM

## 2017-12-01 NOTE — Progress Notes (Signed)
Noted iv consult with history of AVF/AVG in left upper arm. Per history, AVF/AVG is occluded with no plans for future AVF/AVG in left arm. Spouse stated that nephrologist instructed [place] would be best for use with future dialysis treatments. Left arm used for IV access based on this history. RN notified.

## 2017-12-01 NOTE — Progress Notes (Signed)
[facility] Progress Note    Subjective:   Able to open his eyes and say yes and answer simple questions but remains disoriented to time and place.   Objective:   BP (!) 107/33 (BP Location: Left Arm)   Pulse 76   Temp 98.9 F (37.2 C)   Resp 18   Ht 5\' 10"  (1.778 m) Comment: on 10/25/2017  Wt 91.2 kg (201 lb 1 oz)   SpO2 100%   BMI 28.85 kg/m   Intake/Output: I/O last 3 completed shifts: In: 760.5 [NG/GT:610.5; IV Piggyback:150] Out: 2358 [Other:2358]   Intake/Output this shift:  No intake/output data recorded. Weight change: -0.2 kg (-7.1 oz)  Physical Exam: DUK:GURKYH mentation but arousable, chronically ill-appearing CVS: no rub Resp:CTA Abd: +BS, soft, NT Ext: s/p bilateral BKA's  Labs: BMET Recent Labs  Lab 11/26/17 0406 11/27/17 0501 11/28/17 0347 11/29/17 0403 11/30/17 0517 11/30/17 1858 12/01/17 0439  NA 135 135 134* 136 137  --  136  K 3.9 4.0 3.8 3.7 3.6  --  3.8  CL 98* 94* 94* 95* 100*  --  99*  CO2 24 22 23 22 23   --  24  GLUCOSE 84 68 67 62* 91  --  112*  BUN 18 31* 15 23* 33*  --  19  CREATININE 5.90* 7.99* 5.91* 7.85* 9.59*  --  6.62*  ALBUMIN  --   --   --   --   --   --  2.9*  CALCIUM 8.6* 8.8* 8.6* 8.8* 8.7*  --  9.2  PHOS  --   --   --   --  6.8* 3.3 4.3   CBC Recent Labs  Lab 11/25/17 0837  11/28/17 0347 11/29/17 0403 11/30/17 0517 12/01/17 0439  WBC 5.7   < > 6.1 6.2 5.4 6.7  NEUTROABS 3.8  --   --   --   --   --   HGB 11.0*   < > 12.0* 12.4* 11.5* 12.2*  HCT 33.4*   < > 35.7* 37.2* 34.8* 37.8*  MCV 89.3   < > 89.3 90.1 89.2 89.8  PLT 192   < > 320 276 276 318   < > = values in this interval not displayed.    @IMGRELPRIORS @ Medications:    . amiodarone  200 mg Per Tube Daily  . atorvastatin  40 mg Per Tube QHS  . feeding supplement (NEPRO CARB STEADY)  1,000 mL Per Tube Q24H  . feeding supplement (PRO-STAT SUGAR FREE 64)  30 mL Per Tube BID  . latanoprost  1 drop Both Eyes QHS  . mouth rinse  15 mL  Mouth Rinse BID  . midodrine  5 mg Per Tube BID WC  . QUEtiapine  12.5 mg Per Tube QHS  . sodium chloride flush  3 mL Intravenous Q12H   Dialysis Orders: TTS at [facility] 4h 91.5kg 2/2.5 bath p4 Hep 4000+ 2000 midrun Left chest tdc Venofer 50mg  IV q week   Assessment/ Plan:   1. Metabolic encephalopathy/possbile viral encephalitis: Presented with AMS/Sepsis. No clear etiology. Neurology following. ABX dc'd 11/28/17 per ID. Cx's negative resp pcr neg and csf unremarkable. AMS still variable, possibly improving. 2. ESRD HD T,Th,S- using tunneled hd cath, blood cx's negative. HD today. No heparin-on heparin gtt. K+ 3.8 use 4.0 K bath 3. Hypotension/Volume: Chronic hypotension on midodrine. HD 11/27/17 Pre wt 92.3 kg net UF 1.0 liter Post wt 88.9 kg. Now under OP EDW. BP stable. No stump edema  but trace edema upper thighs and hand. Lower EDW-UFG 2.5-3 liters at tolerated. 4. HGB 11.5 No ESA. Follow HGB. 5. Afib-Rate controlled on amiodarone, on heparin gtt per pharmacy 6. T2DM:per primary 7. CKD BMD: Renal function panel with HD tomorrow. 8. hx cva and/or seizures - started on keppra here, mri head negative. EEG 3 day intensive was negative for clinical or subclinical seizure activity.   9. Moderate protein malnutrition- low albumin and poor po intake 10. Disposition- pt has had FTT as well as worsening depression over the last few months.  He has a poor prognosis given his poor functional/nutritional status and multiple comorbidities.  Recommend palliative care consult to help set goals/limits of care.    Donetta Potts, MD [place] Pager [phone] 12/01/2017, 4:58 PM

## 2017-12-01 NOTE — Progress Notes (Signed)
PT Cancellation Note  Patient Details Name: Oscar Castillo MRN: [id number] DOB: Dec 31, 1950   Cancelled Treatment:    Reason Eval/Treat Not Completed: Fatigue/lethargy limiting ability to participate;Patient's level of consciousness.  Pt is very drowsy and cannot open eyes or assist movement yet.  Will try later as time and pt allow.   Ramond Dial 12/01/2017, 12:17 PM   Mee Hives, PT MS Acute Rehab Dept. Number: [place] and [place]

## 2017-12-01 NOTE — Progress Notes (Addendum)
Subjective: Patient in bed with flat affect--when ask to show me his thumb he did not but did squeeze my hand. When asked to raise his arm he said " I cannot" he said this to smiling and wiggling wingers. When noxious stimuli given he stated "ouch, God Damn" along with raising both arms and giving me a grimace.   Exam: Vitals:   12/01/17 0612 12/01/17 0715  BP: (!) 90/34 (!) 113/56  Pulse: 73 77  Resp: 17   Temp: 98.2 F (36.8 C)   SpO2:      Physical Exam    Musculoskeletal-no joint tenderness, deformity or swelling Skin-warm and dry, no hyperpigmentation, vitiligo, or suspicious lesions   Neuro:  Mental Status: Initially abulic but quickly responds and becomes agitaed with noxious stimuli. Would not respond to questions.  Speech output provoked by other stimuli is fluent without evidence of gross receptive or expressive aphasia.   Cranial Nerves: II: blinks to threat  III,IV, VI: ptosis not present, extra-ocular motions intact bilaterally pupils equal, round, reactive to light and accommodation V,VII: face symmetric, facial light touch sensation normal bilaterally VIII: hearing intact to voice Motor: Bilateral UE move antigravity, BKA in LE bilaterally Sensory: Intact to noxious stimuli Deep Tendon Reflexes: 2+ bilateral UE    Medications:  Scheduled: . amiodarone  200 mg Per Tube Daily  . atorvastatin  40 mg Per Tube QHS  . feeding supplement (NEPRO CARB STEADY)  1,000 mL Per Tube Q24H  . feeding supplement (PRO-STAT SUGAR FREE 64)  30 mL Per Tube BID  . heparin  4,000 Units Dialysis Once in dialysis  . latanoprost  1 drop Both Eyes QHS  . mouth rinse  15 mL Mouth Rinse BID  . midodrine  5 mg Per Tube BID WC  . QUEtiapine  12.5 mg Per Tube QHS  . sodium chloride flush  3 mL Intravenous Q12H   Continuous: . sodium chloride    . sodium chloride    . sodium chloride    . famotidine (PEPCID) IV 20 mg (12/01/17 1015)  . heparin 1,200 Units/hr (12/01/17 1012)  .  thiamine injection 0 mg (11/30/17 2327)    Pertinent Labs/Diagnostics: EEG LTM:  Day of study: Day 3   This  intensive EEG monitoring with simultaneous video monitoring was performed for this patient with spells confusion and right facial weakness to rule out clinical and subclinical electrographic seizures to explain his symptoms. Medications: As per EMR  Day 1 There was no pushbutton activations events during this recording.  Automated spike detection program did not detect any spikes. Seizure detection program did not detect any seizures .   There was no clinical or subclinical seizures present.  Background activities marked by 2 to 4 cps background activity slowing with more prominent 1.5 to 2 cps delta slowing distributed more anteriorly and in continuous fashion.  Background activity at times reach 5 to 6 cps with posterior dominance.  At times delta slowing carry triphasic morphology however triphasic waves were not a prominent activity throughout this recording.  There was no interhemispheric asymmetries, focal abnormalities or epileptiform discharges present.  No clinical or subclinical seizures.  Day 2: Mild background activity slowing in with 5 to 6 cps posterior dominant rhythm and prominent anterior dominant delta slowing.  At times triphasic waves present but not a prominent findings on this study.  On few occasions poorly formed sharp waves present particularly in the left posterior temporal/parietal region.   Day 3: No events. No seizures.  background activity slowing 5 to 6 cps with more prominent anterior dominant delta slowing.    On few occasions poorly formed sharp waves present particularly in the left posterior temporal/parietal region.   Clinical interpretation: This day 3 of intensive EEG monitoring with simultaneous video monitoring did not record any clinical subclinical seizures.  Background activities were abnormal due to mild  background activity slowing   suggestive of  encephalopathy of nonspecific etiologies including toxic metabolic pharmacological multifocal degenerative etiologies.      Dg Abd Portable 1v  Result Date: 11/29/2017 CLINICAL DATA:  Feeding tube placement. EXAM: PORTABLE ABDOMEN - 1 VIEW COMPARISON:  CT abdomen pelvis dated May 06, 2017. FINDINGS: Feeding tube tip in the gastric body. There are few mildly dilated loops of small bowel in the central abdomen. Large amount of stool in the rectum. No acute osseous abnormality. IMPRESSION: 1. Feeding tube tip in the gastric body. 2. Non-specific bowel gas pattern may reflect mild ileus. Electronically Signed   By: Titus Dubin M.D.   On: 11/29/2017 11:19    Impression: 67 year old male with progressive encephalopathy after presentation most consistent with sepsis. 1. Given no seizures on LTM, likely secondary to toxic/metabolic/infectious encephalopathy, hospital delirium or possible FTT.  2. MRI findings of chronic atrophy with advanced chronic small vessel ischemic disease within the thalami and pons with associated chronic brainstem atrophy are suggestive of decreased neurological reserve, which would put him at greater risk for a persistent encephalopathy following a toxic, metabolic or infectious insult  Recommendations: -No further imaging or EEG studies recommended from an neurological standpoint.  -Neurohospitalist service will S/O  -May need outpatient neurology evaluation for possible underlying dementia.   Etta Quill PA-C [facility] [phone] 12/01/2017, 10:16 AM   Electronically signed: Dr. Kerney Elbe

## 2017-12-01 NOTE — Progress Notes (Signed)
PROGRESS NOTE  KONOR NOREN SWN:[id number] DOB: 12-16-1950 DOA: 11/21/2017 PCP: [name], MD  HPI/Recap of past 8 hours:  67 year old male who presents with right facial droopand altered mental status. He does have significant past medical history for end-stage renal disease on hemodialysis, atrial fibrillation, diastolic heart failure, history of CVA. Patient developed right facial droop,chills and altered mentation, acute presentation.About 7 days prior he was noted to have decreased po intake.On initial physical examination he was found febrile 104 F, with a septic shock physiology. He was poorly responsive.  He was admitted on 11/21/2017 with  concerns about sepsis, Patient was placed on broad-spectrum antibiotic therapy, prolonged hospital stay, the source of his sepsis continued to be not clear, suspected viral encephalitis. 67 year old male with progressive encephalopathy after presentation most consistent with sepsis.   11/30/2017: Patient seen and examined with his wife at his bedside.  He is somnolent and does not respond to commands.  Video EEG ongoing, day 2 of intensive EEG monitoring with no record of any clinical subclinical seizures.  Neurology following.  Highly appreciated.  12/01/2017 more awake, able to answer questions regarding his name as well as his wife's name.  Occasionally follows commands.  No acute events overnight.  Assessment/Plan: Principal Problem:   Toxic metabolic encephalopathy Active Problems:   Severe sepsis (HCC)   Septic shock (HCC)   Pulmonary edema   Pressure injury of skin  Metabolic encephalopathy with suspected viral encephalitis CSF WBC 0 Persistent encephalopathy with no seizure activity detected on EEG Neurology following.  Highly appreciated.  Continue thiamine Neurology is currently signed out but recommend psychiatric consult, given that the patient has shown improvement with current regimen will hold off on consulting  psych.  Chronic A. fib, stable Rate controlled On Coumadin at home INR 2.26 Currently on heparin drip Continue current medications C/w amiodarone  Type 2 diabetes, stable insulin sliding scale held due to hypoglycemia Avoid hypoglycemia NG Tube feeding in place  Dysphagia, possibly from encephalopathy/previous CVA NG tube feeding in place Elevate head of bed 35 degrees Aspiration precautions  History of CVA and seizures Video EEG ongoing Continue current management  ESRD on HD Tuesday Thursday Saturday Nephrology following  Chronic depression Continue Seroquel  Anemia of chronic disease Nephrology following Hemoglobin stable  GERD C/w pepcid   Code Status: full   Family Communication: wife at bedside  Disposition Plan: Home when clinically stable.   Consultants:  Neurology  Procedures:  None  Antimicrobials:    DVT prophylaxis:  heparin drip   Objective: Vitals:   11/30/17 2323 12/01/17 0612 12/01/17 0715 12/01/17 1420  BP: 100/63 (!) 90/34 (!) 113/56 (!) 107/33  Pulse: 82 73 77 76  Resp:  17  18  Temp: 98.8 F (37.1 C) 98.2 F (36.8 C)  98.9 F (37.2 C)  TempSrc: Axillary Oral    SpO2:    100%  Weight:  91.2 kg (201 lb 1 oz)    Height:        Intake/Output Summary (Last 24 hours) at 12/01/2017 1802 Last data filed at 12/01/2017 1300 Gross per 24 hour  Intake 0 ml  Output -  Net 0 ml   Filed Weights   11/30/17 1250 11/30/17 1658 12/01/17 0612  Weight: 93 kg (205 lb 0.4 oz) 89.9 kg (198 lb 3.1 oz) 91.2 kg (201 lb 1 oz)    Exam:  . General: 67 y.o. year-old male well developed well nourished in no acute distress.  Somnolent. Does not  follow commands. NG tube in place. . Cardiovascular: Regular rate and rhythm with no rubs or gallops.  No thyromegaly or JVD noted.   [name] Respiratory: Clear to auscultation with no wheezes or rales. Good inspiratory effort. . Abdomen: Soft nontender nondistended with normal bowel sounds x4  quadrants. . Musculoskeletal: B/L lower extremity amputee. . Skin: No ulcerative lesions noted or rashes, . Psychiatry: unable to assess due to encephalopathy   Data Reviewed: CBC: Recent Labs  Lab 11/25/17 0837  11/27/17 0501 11/28/17 0347 11/29/17 0403 11/30/17 0517 12/01/17 0439  WBC 5.7   < > 6.8 6.1 6.2 5.4 6.7  NEUTROABS 3.8  --   --   --   --   --   --   HGB 11.0*   < > 12.4* 12.0* 12.4* 11.5* 12.2*  HCT 33.4*   < > 37.9* 35.7* 37.2* 34.8* 37.8*  MCV 89.3   < > 89.6 89.3 90.1 89.2 89.8  PLT 192   < > 205 320 276 276 318   < > = values in this interval not displayed.   Basic Metabolic Panel: Recent Labs  Lab 11/27/17 0501 11/28/17 0347 11/29/17 0403 11/30/17 0517 11/30/17 1858 12/01/17 0439 12/01/17 1632  NA 135 134* 136 137  --  136  --   K 4.0 3.8 3.7 3.6  --  3.8  --   CL 94* 94* 95* 100*  --  99*  --   CO2 22 23 22 23   --  24  --   GLUCOSE 68 67 62* 91  --  112*  --   BUN 31* 15 23* 33*  --  19  --   CREATININE 7.99* 5.91* 7.85* 9.59*  --  6.62*  --   CALCIUM 8.8* 8.6* 8.8* 8.7*  --  9.2  --   MG  --   --   --  2.0 2.1 2.0 2.1  PHOS  --   --   --  6.8* 3.3 4.3 4.8*   GFR: Estimated Creatinine Clearance: 12.5 mL/min (A) (by C-G formula based on SCr of 6.62 mg/dL (H)). Liver Function Tests: Recent Labs  Lab 12/01/17 0439  AST 14*  ALT 8*  ALKPHOS 39  BILITOT 0.6  PROT 7.8  ALBUMIN 2.9*   No results for input(s): LIPASE, AMYLASE in the last 168 hours. Recent Labs  Lab 11/28/17 1927  AMMONIA 16   Coagulation Profile: Recent Labs  Lab 11/27/17 0501 11/28/17 0347 11/29/17 0403 11/30/17 0517 12/01/17 0439  INR 1.44 1.66 1.97 2.26 1.86   Cardiac Enzymes: No results for input(s): CKTOTAL, CKMB, CKMBINDEX, TROPONINI in the last 168 hours. BNP (last 3 results) No results for input(s): PROBNP in the last 8760 hours. HbA1C: No results for input(s): HGBA1C in the last 72 hours. CBG: Recent Labs  Lab 12/01/17 0001 12/01/17 0523  12/01/17 0746 12/01/17 1233 12/01/17 1648  GLUCAP 132* 139* 135* 149* 131*   Lipid Profile: No results for input(s): CHOL, HDL, LDLCALC, TRIG, CHOLHDL, LDLDIRECT in the last 72 hours. Thyroid Function Tests: Recent Labs    11/28/17 1927  TSH 1.535   Anemia Panel: Recent Labs    11/28/17 1927  VITAMINB12 843   Urine analysis:    Component Value Date/Time   COLORURINE YELLOW 05/15/2013 0007   APPEARANCEUR CLEAR 05/15/2013 0007   LABSPEC 1.014 05/15/2013 0007   PHURINE 8.5 (H) 05/15/2013 0007   GLUCOSEU 250 (A) 05/15/2013 0007   GLUCOSEU 500 (?) 11/06/2009 0948   HGBUR NEGATIVE  05/15/2013 0007   BILIRUBINUR NEGATIVE 05/15/2013 0007   KETONESUR NEGATIVE 05/15/2013 0007   PROTEINUR >300 (A) 05/15/2013 0007   UROBILINOGEN 0.2 05/15/2013 0007   NITRITE NEGATIVE 05/15/2013 0007   LEUKOCYTESUR NEGATIVE 05/15/2013 0007   Sepsis Labs: @LABRCNTIP (procalcitonin:4,lacticidven:4)  ) Recent Results (from the past 240 hour(s))  Culture, blood (single)     Status: None   Collection Time: 11/21/17 10:58 PM  Result Value Ref Range Status   Specimen Description BLOOD RIGHT ANTECUBITAL  Final   Special Requests   Final    BOTTLES DRAWN AEROBIC AND ANAEROBIC Blood Culture adequate volume   Culture   Final    NO GROWTH 5 DAYS Performed at [facility], [address] [place], [place] [address]    Report Status 11/26/2017 FINAL  Final  MRSA PCR Screening     Status: None   Collection Time: 11/22/17  4:28 AM  Result Value Ref Range Status   MRSA by PCR NEGATIVE NEGATIVE Final    Comment:        The GeneXpert MRSA Assay (FDA approved for NASAL specimens only), is one component of a comprehensive MRSA colonization surveillance program. It is not intended to diagnose MRSA infection nor to guide or monitor treatment for MRSA infections. Performed at [facility], [place] [address], [place], [place] [address]   Respiratory Panel by PCR     Status: None   Collection  Time: 11/22/17  4:31 AM  Result Value Ref Range Status   Adenovirus NOT DETECTED NOT DETECTED Final   Coronavirus 229E NOT DETECTED NOT DETECTED Final   Coronavirus HKU1 NOT DETECTED NOT DETECTED Final   Coronavirus NL63 NOT DETECTED NOT DETECTED Final   Coronavirus OC43 NOT DETECTED NOT DETECTED Final   Metapneumovirus NOT DETECTED NOT DETECTED Final   Rhinovirus / Enterovirus NOT DETECTED NOT DETECTED Final   Influenza A NOT DETECTED NOT DETECTED Final   Influenza B NOT DETECTED NOT DETECTED Final   Parainfluenza Virus 1 NOT DETECTED NOT DETECTED Final   Parainfluenza Virus 2 NOT DETECTED NOT DETECTED Final   Parainfluenza Virus 3 NOT DETECTED NOT DETECTED Final   Parainfluenza Virus 4 NOT DETECTED NOT DETECTED Final   Respiratory Syncytial Virus NOT DETECTED NOT DETECTED Final   Bordetella pertussis NOT DETECTED NOT DETECTED Final   Chlamydophila pneumoniae NOT DETECTED NOT DETECTED Final   Mycoplasma pneumoniae NOT DETECTED NOT DETECTED Final  Culture, blood (single)     Status: None   Collection Time: 11/22/17  6:08 AM  Result Value Ref Range Status   Specimen Description BLOOD LEFT ANTECUBITAL  Final   Special Requests   Final    BOTTLES DRAWN AEROBIC ONLY Blood Culture adequate volume   Culture   Final    NO GROWTH 5 DAYS Performed at [facility], [address] [place], [place] [address]    Report Status 11/27/2017 FINAL  Final  Culture, blood (single)     Status: None   Collection Time: 11/22/17  6:18 AM  Result Value Ref Range Status   Specimen Description BLOOD LEFT HAND  Final   Special Requests   Final    BOTTLES DRAWN AEROBIC ONLY Blood Culture adequate volume   Culture   Final    NO GROWTH 5 DAYS Performed at [facility], [place] [address], [place], [place] [address]    Report Status 11/27/2017 FINAL  Final  CSF culture with Stat gram stain     Status:  None   Collection Time: 11/25/17 11:16 AM  Result Value Ref Range Status   Specimen Description  CSF  Final   Special Requests NONE  Final   Gram Stain   Final    CYTOSPIN SMEAR WBC PRESENT, PREDOMINANTLY MONONUCLEAR NO ORGANISMS SEEN    Culture   Final    NO GROWTH 3 DAYS Performed at [facility], [address] [place], [place] [address]    Report Status 11/28/2017 FINAL  Final      Studies: No results found.  Scheduled Meds: . amiodarone  200 mg Per Tube Daily  . atorvastatin  40 mg Per Tube QHS  . feeding supplement (NEPRO CARB STEADY)  1,000 mL Per Tube Q24H  . feeding supplement (PRO-STAT SUGAR FREE 64)  30 mL Per Tube BID  . latanoprost  1 drop Both Eyes QHS  . mouth rinse  15 mL Mouth Rinse BID  . midodrine  5 mg Per Tube BID WC  . QUEtiapine  12.5 mg Per Tube QHS  . sodium chloride flush  3 mL Intravenous Q12H    Continuous Infusions: . sodium chloride    . sodium chloride    . sodium chloride    . famotidine (PEPCID) IV Stopped (12/01/17 1045)  . heparin 1,200 Units/hr (12/01/17 1012)     LOS: 10 days     Berle Mull, MD [facility]  If 7PM-7AM, please contact night-coverage [email or web address] Password [facility] 12/01/2017, 6:02 PM

## 2017-12-01 NOTE — Progress Notes (Signed)
  Speech Language Pathology Treatment: Dysphagia  Patient Details Name: Oscar Castillo MRN: [id number] DOB: 1950/09/23 Today's Date: 12/01/2017 Time: 3212-2482 SLP Time Calculation (min) (ACUTE ONLY): 30 min  Assessment / Plan / Recommendation Clinical Impression  Pt seen with wife at bedside. Oral care completed including removal of denture, pt repositioned. With max verbal cues from wife, pt opened eyes, responded yes to many questions, though likely without comprehension. Wife and SLP provided max assist for head support, tactile cues for awareness of PO. Eventually pt began following wife's verbal commands to accept ice orally and consumed 1/2 a cup of ice with automatic mastication and swallow response with no signs of aspiration. Given wife's excellent understanding of how to feed pt and increase his awareness of PO when responsive, will allow wife to feed pt ice occasionally through the day with the hope of making pt more frequently responsive and attentive to PO. Will follow for tolerance.   HPI        SLP Plan  Continue with current plan of care       Recommendations  Diet recommendations: NPO;Other(comment)(ice chips of)                Oral Care Recommendations: Oral care QID Follow up Recommendations: Skilled Nursing facility Plan: Continue with current plan of care       GO               [facility], [place] CCC-SLP [phone]  Lynann Beaver 12/01/2017, 12:29 PM

## 2017-12-01 NOTE — Procedures (Signed)
  Electroencephalogram report- LTM    Data acquisition: 10-20 electrode placement.  Additional T1, T2, and EKG electrodes; 26 channel digital referential acquisition reformatted to 18 channel/7 channel coronal bipolar     Spike detection: ON     Seizure detection: ON   Beginning time: 11/30/2017 at 07 35 am  Ending time: 11/30/2017 at  16 56   CPT: 09983-38 Day of study: Day 3   This  intensive EEG monitoring with simultaneous video monitoring was performed for this patient with spells confusion and right facial weakness to rule out clinical and subclinical electrographic seizures to explain his symptoms. Medications: As per EMR  Day 1 There was no pushbutton activations events during this recording.  Automated spike detection program did not detect any spikes. Seizure detection program did not detect any seizures .   There was no clinical or subclinical seizures present.  Background activities marked by 2 to 4 cps background activity slowing with more prominent 1.5 to 2 cps delta slowing distributed more anteriorly and in continuous fashion.  Background activity at times reach 5 to 6 cps with posterior dominance.  At times delta slowing carry triphasic morphology however triphasic waves were not a prominent activity throughout this recording.  There was no interhemispheric asymmetries, focal abnormalities or epileptiform discharges present.  No clinical or subclinical seizures.  Day 2: Mild background activity slowing in with 5 to 6 cps posterior dominant rhythm and prominent anterior dominant delta slowing.  At times triphasic waves present but not a prominent findings on this study.  On few occasions poorly formed sharp waves present particularly in the left posterior temporal/parietal region.   Day 3: No events. No seizures.  background activity slowing 5 to 6 cps with more prominent anterior dominant delta slowing.    On few occasions poorly formed sharp waves present particularly in  the left posterior temporal/parietal region.   Clinical interpretation: This day 3 of intensive EEG monitoring with simultaneous video monitoring did not record any clinical subclinical seizures.  Background activities were abnormal due to mild  background activity slowing  suggestive of  encephalopathy of nonspecific etiologies including toxic metabolic pharmacological multifocal degenerative etiologies.

## 2017-12-02 DIAGNOSIS — F1021 Alcohol dependence, in remission: Secondary | ICD-10-CM | POA: Diagnosis not present

## 2017-12-02 DIAGNOSIS — I132 Hypertensive heart and chronic kidney disease with heart failure and with stage 5 chronic kidney disease, or end stage renal disease: Secondary | ICD-10-CM | POA: Diagnosis present

## 2017-12-02 DIAGNOSIS — F05 Delirium due to known physiological condition: Secondary | ICD-10-CM | POA: Diagnosis not present

## 2017-12-02 DIAGNOSIS — R1310 Dysphagia, unspecified: Secondary | ICD-10-CM | POA: Diagnosis not present

## 2017-12-02 DIAGNOSIS — R41841 Cognitive communication deficit: Secondary | ICD-10-CM | POA: Diagnosis not present

## 2017-12-02 DIAGNOSIS — I959 Hypotension, unspecified: Secondary | ICD-10-CM | POA: Diagnosis not present

## 2017-12-02 DIAGNOSIS — I4891 Unspecified atrial fibrillation: Secondary | ICD-10-CM | POA: Diagnosis not present

## 2017-12-02 DIAGNOSIS — F0390 Unspecified dementia without behavioral disturbance: Secondary | ICD-10-CM | POA: Diagnosis present

## 2017-12-02 DIAGNOSIS — A86 Unspecified viral encephalitis: Secondary | ICD-10-CM | POA: Diagnosis not present

## 2017-12-02 DIAGNOSIS — E119 Type 2 diabetes mellitus without complications: Secondary | ICD-10-CM | POA: Diagnosis not present

## 2017-12-02 DIAGNOSIS — I480 Paroxysmal atrial fibrillation: Secondary | ICD-10-CM | POA: Diagnosis not present

## 2017-12-02 DIAGNOSIS — L89153 Pressure ulcer of sacral region, stage 3: Secondary | ICD-10-CM | POA: Diagnosis present

## 2017-12-02 DIAGNOSIS — M2550 Pain in unspecified joint: Secondary | ICD-10-CM | POA: Diagnosis not present

## 2017-12-02 DIAGNOSIS — F0631 Mood disorder due to known physiological condition with depressive features: Secondary | ICD-10-CM | POA: Diagnosis present

## 2017-12-02 DIAGNOSIS — I10 Essential (primary) hypertension: Secondary | ICD-10-CM | POA: Diagnosis not present

## 2017-12-02 DIAGNOSIS — F418 Other specified anxiety disorders: Secondary | ICD-10-CM | POA: Diagnosis present

## 2017-12-02 DIAGNOSIS — F329 Major depressive disorder, single episode, unspecified: Secondary | ICD-10-CM | POA: Diagnosis present

## 2017-12-02 DIAGNOSIS — E1122 Type 2 diabetes mellitus with diabetic chronic kidney disease: Secondary | ICD-10-CM | POA: Diagnosis not present

## 2017-12-02 DIAGNOSIS — R4182 Altered mental status, unspecified: Secondary | ICD-10-CM | POA: Diagnosis not present

## 2017-12-02 DIAGNOSIS — R1312 Dysphagia, oropharyngeal phase: Secondary | ICD-10-CM | POA: Diagnosis present

## 2017-12-02 DIAGNOSIS — E878 Other disorders of electrolyte and fluid balance, not elsewhere classified: Secondary | ICD-10-CM | POA: Diagnosis not present

## 2017-12-02 DIAGNOSIS — G9340 Encephalopathy, unspecified: Secondary | ICD-10-CM | POA: Diagnosis not present

## 2017-12-02 DIAGNOSIS — R2689 Other abnormalities of gait and mobility: Secondary | ICD-10-CM | POA: Diagnosis not present

## 2017-12-02 DIAGNOSIS — Z7409 Other reduced mobility: Secondary | ICD-10-CM | POA: Diagnosis not present

## 2017-12-02 DIAGNOSIS — F419 Anxiety disorder, unspecified: Secondary | ICD-10-CM | POA: Diagnosis not present

## 2017-12-02 DIAGNOSIS — I9589 Other hypotension: Secondary | ICD-10-CM | POA: Diagnosis not present

## 2017-12-02 DIAGNOSIS — K8020 Calculus of gallbladder without cholecystitis without obstruction: Secondary | ICD-10-CM | POA: Diagnosis not present

## 2017-12-02 DIAGNOSIS — F331 Major depressive disorder, recurrent, moderate: Secondary | ICD-10-CM | POA: Diagnosis not present

## 2017-12-02 DIAGNOSIS — N186 End stage renal disease: Secondary | ICD-10-CM | POA: Diagnosis present

## 2017-12-02 DIAGNOSIS — I69398 Other sequelae of cerebral infarction: Secondary | ICD-10-CM | POA: Diagnosis not present

## 2017-12-02 DIAGNOSIS — Z431 Encounter for attention to gastrostomy: Secondary | ICD-10-CM | POA: Diagnosis not present

## 2017-12-02 DIAGNOSIS — N529 Male erectile dysfunction, unspecified: Secondary | ICD-10-CM | POA: Diagnosis not present

## 2017-12-02 DIAGNOSIS — E782 Mixed hyperlipidemia: Secondary | ICD-10-CM | POA: Diagnosis not present

## 2017-12-02 DIAGNOSIS — R1319 Other dysphagia: Secondary | ICD-10-CM | POA: Diagnosis not present

## 2017-12-02 DIAGNOSIS — M6281 Muscle weakness (generalized): Secondary | ICD-10-CM | POA: Diagnosis not present

## 2017-12-02 DIAGNOSIS — R6521 Severe sepsis with septic shock: Secondary | ICD-10-CM | POA: Diagnosis not present

## 2017-12-02 DIAGNOSIS — E669 Obesity, unspecified: Secondary | ICD-10-CM | POA: Diagnosis not present

## 2017-12-02 DIAGNOSIS — Z992 Dependence on renal dialysis: Secondary | ICD-10-CM | POA: Diagnosis not present

## 2017-12-02 DIAGNOSIS — A419 Sepsis, unspecified organism: Secondary | ICD-10-CM | POA: Diagnosis not present

## 2017-12-02 DIAGNOSIS — I447 Left bundle-branch block, unspecified: Secondary | ICD-10-CM | POA: Diagnosis not present

## 2017-12-02 DIAGNOSIS — R627 Adult failure to thrive: Secondary | ICD-10-CM | POA: Diagnosis present

## 2017-12-02 DIAGNOSIS — G9349 Other encephalopathy: Secondary | ICD-10-CM | POA: Diagnosis not present

## 2017-12-02 DIAGNOSIS — Z7401 Bed confinement status: Secondary | ICD-10-CM | POA: Diagnosis not present

## 2017-12-02 DIAGNOSIS — E113599 Type 2 diabetes mellitus with proliferative diabetic retinopathy without macular edema, unspecified eye: Secondary | ICD-10-CM | POA: Diagnosis not present

## 2017-12-02 DIAGNOSIS — F319 Bipolar disorder, unspecified: Secondary | ICD-10-CM | POA: Diagnosis not present

## 2017-12-02 DIAGNOSIS — I5042 Chronic combined systolic (congestive) and diastolic (congestive) heart failure: Secondary | ICD-10-CM | POA: Diagnosis present

## 2017-12-02 DIAGNOSIS — E1169 Type 2 diabetes mellitus with other specified complication: Secondary | ICD-10-CM | POA: Diagnosis not present

## 2017-12-02 DIAGNOSIS — G92 Toxic encephalopathy: Secondary | ICD-10-CM | POA: Diagnosis not present

## 2017-12-02 DIAGNOSIS — R1112 Projectile vomiting: Secondary | ICD-10-CM | POA: Diagnosis not present

## 2017-12-02 DIAGNOSIS — R109 Unspecified abdominal pain: Secondary | ICD-10-CM | POA: Diagnosis not present

## 2017-12-02 DIAGNOSIS — R1311 Dysphagia, oral phase: Secondary | ICD-10-CM | POA: Diagnosis not present

## 2017-12-02 DIAGNOSIS — N400 Enlarged prostate without lower urinary tract symptoms: Secondary | ICD-10-CM | POA: Diagnosis not present

## 2017-12-02 DIAGNOSIS — D649 Anemia, unspecified: Secondary | ICD-10-CM | POA: Diagnosis present

## 2017-12-02 DIAGNOSIS — R5381 Other malaise: Secondary | ICD-10-CM | POA: Diagnosis not present

## 2017-12-02 DIAGNOSIS — E1151 Type 2 diabetes mellitus with diabetic peripheral angiopathy without gangrene: Secondary | ICD-10-CM | POA: Diagnosis present

## 2017-12-02 DIAGNOSIS — K219 Gastro-esophageal reflux disease without esophagitis: Secondary | ICD-10-CM | POA: Diagnosis not present

## 2017-12-02 DIAGNOSIS — R05 Cough: Secondary | ICD-10-CM | POA: Diagnosis not present

## 2017-12-02 DIAGNOSIS — N2581 Secondary hyperparathyroidism of renal origin: Secondary | ICD-10-CM | POA: Diagnosis not present

## 2017-12-02 DIAGNOSIS — R4189 Other symptoms and signs involving cognitive functions and awareness: Secondary | ICD-10-CM | POA: Diagnosis not present

## 2017-12-02 DIAGNOSIS — E873 Alkalosis: Secondary | ICD-10-CM | POA: Diagnosis not present

## 2017-12-02 DIAGNOSIS — K740 Hepatic fibrosis: Secondary | ICD-10-CM | POA: Diagnosis not present

## 2017-12-02 DIAGNOSIS — E1165 Type 2 diabetes mellitus with hyperglycemia: Secondary | ICD-10-CM | POA: Diagnosis present

## 2017-12-02 LAB — RENAL FUNCTION PANEL
Albumin: 2.8 g/dL — ABNORMAL LOW (ref 3.5–5.0)
Anion gap: 11 (ref 5–15)
BUN: 35 mg/dL — ABNORMAL HIGH (ref 6–20)
CHLORIDE: 97 mmol/L — AB (ref 101–111)
CO2: 26 mmol/L (ref 22–32)
Calcium: 9.3 mg/dL (ref 8.9–10.3)
Creatinine, Ser: 8.2 mg/dL — ABNORMAL HIGH (ref 0.61–1.24)
GFR calc Af Amer: 7 mL/min — ABNORMAL LOW (ref >60)
GFR calc non Af Amer: 6 mL/min — ABNORMAL LOW (ref >60)
Glucose, Bld: 129 mg/dL — ABNORMAL HIGH (ref 65–99)
POTASSIUM: 3.8 mmol/L (ref 3.5–5.1)
Phosphorus: 5.1 mg/dL — ABNORMAL HIGH (ref 2.5–4.6)
Sodium: 134 mmol/L — ABNORMAL LOW (ref 135–145)

## 2017-12-02 LAB — BASIC METABOLIC PANEL
ANION GAP: 12 (ref 5–15)
BUN: 17 mg/dL (ref 6–20)
CALCIUM: 9.3 mg/dL (ref 8.9–10.3)
CHLORIDE: 101 mmol/L (ref 101–111)
CO2: 26 mmol/L (ref 22–32)
CREATININE: 5.02 mg/dL — AB (ref 0.61–1.24)
GFR calc non Af Amer: 11 mL/min — ABNORMAL LOW (ref >60)
GFR, EST AFRICAN AMERICAN: 13 mL/min — AB (ref >60)
Glucose, Bld: 132 mg/dL — ABNORMAL HIGH (ref 65–99)
Potassium: 4.1 mmol/L (ref 3.5–5.1)
SODIUM: 139 mmol/L (ref 135–145)

## 2017-12-02 LAB — GLUCOSE, CAPILLARY
GLUCOSE-CAPILLARY: 121 mg/dL — AB (ref 65–99)
GLUCOSE-CAPILLARY: 117 mg/dL — AB (ref 65–99)
GLUCOSE-CAPILLARY: 154 mg/dL — AB (ref 65–99)
Glucose-Capillary: 120 mg/dL — ABNORMAL HIGH (ref 65–99)
Glucose-Capillary: 158 mg/dL — ABNORMAL HIGH (ref 65–99)

## 2017-12-02 LAB — CBC
HEMATOCRIT: 37 % — AB (ref 39.0–52.0)
Hemoglobin: 12 g/dL — ABNORMAL LOW (ref 13.0–17.0)
MCH: 29.1 pg (ref 26.0–34.0)
MCHC: 32.4 g/dL (ref 30.0–36.0)
MCV: 89.8 fL (ref 78.0–100.0)
PLATELETS: 310 10*3/uL (ref 150–400)
RBC: 4.12 MIL/uL — AB (ref 4.22–5.81)
RDW: 17.3 % — ABNORMAL HIGH (ref 11.5–15.5)
WBC: 6.9 10*3/uL (ref 4.0–10.5)

## 2017-12-02 LAB — VITAMIN B1: Vitamin B1 (Thiamine): 94.6 nmol/L (ref 66.5–200.0)

## 2017-12-02 LAB — HEPARIN LEVEL (UNFRACTIONATED): Heparin Unfractionated: 0.96 IU/mL — ABNORMAL HIGH (ref 0.30–0.70)

## 2017-12-02 LAB — PROTIME-INR
INR: 1.53
Prothrombin Time: 18.2 seconds — ABNORMAL HIGH (ref 11.4–15.2)

## 2017-12-02 LAB — PHOSPHORUS
PHOSPHORUS: 2.7 mg/dL (ref 2.5–4.6)
Phosphorus: 2.6 mg/dL (ref 2.5–4.6)

## 2017-12-02 LAB — MAGNESIUM: MAGNESIUM: 1.9 mg/dL (ref 1.7–2.4)

## 2017-12-02 LAB — LACTIC ACID, PLASMA: LACTIC ACID, VENOUS: 1.4 mmol/L (ref 0.5–1.9)

## 2017-12-02 MED ORDER — NEPRO/CARBSTEADY PO LIQD: 1000.0000 mL | ORAL | 0 refills | Status: DC

## 2017-12-02 MED ORDER — AMIODARONE HCL 200 MG PO TABS: 200.0000 mg | ORAL_TABLET | Freq: Every day | ORAL | 0 refills | Status: AC

## 2017-12-02 MED ORDER — FAMOTIDINE 40 MG/5ML PO SUSR: 20.0000 mg | Freq: Every day | ORAL | 0 refills | Status: DC

## 2017-12-02 MED ORDER — ATORVASTATIN CALCIUM 40 MG PO TABS: 40.0000 mg | ORAL_TABLET | Freq: Every day | ORAL | 0 refills | Status: AC

## 2017-12-02 MED ORDER — HEPARIN SODIUM (PORCINE) 1000 UNIT/ML DIALYSIS
20.0000 [IU]/kg | INTRAMUSCULAR | Status: DC | PRN
  Administered 2017-12-02: 1800 [IU] via INTRAVENOUS_CENTRAL
  Filled 2017-12-02 (×2): qty 2

## 2017-12-02 MED ORDER — ACETAMINOPHEN 160 MG/5ML PO SOLN: 650.0000 mg | Freq: Four times a day (QID) | ORAL | 0 refills | Status: DC | PRN

## 2017-12-02 MED ORDER — HEPARIN (PORCINE) IN NACL 100-0.45 UNIT/ML-% IJ SOLN: 1000.0000 [IU]/h | INTRAMUSCULAR | Status: DC

## 2017-12-02 MED ORDER — MIDODRINE HCL 5 MG PO TABS: 5.0000 mg | ORAL_TABLET | Freq: Two times a day (BID) | ORAL | 0 refills | Status: DC

## 2017-12-02 MED ORDER — PRO-STAT SUGAR FREE PO LIQD: 30.0000 mL | Freq: Two times a day (BID) | ORAL | 0 refills | Status: DC

## 2017-12-02 MED ORDER — FAMOTIDINE 40 MG/5ML PO SUSR: 20.0000 mg | Freq: Every day | ORAL | Status: DC

## 2017-12-02 NOTE — Evaluation (Signed)
Physical Therapy Evaluation Patient Details Name: DOIL KAMARA MRN: [id number] DOB: [birth year] Today's Date: 12/02/2017   History of Present Illness  67 year old man with medical problems including end-stage renal disease,  poorly controlled diabetes, history of AV block heart pacer, atrial fibrillation on vitamin K antagonists, bilateral BKA, who presents with confusion.  Clinical Impression  Patient evaluated by Physical Therapy with no further acute PT needs identified. All education has been completed and the patient has no further questions. See below for any follow-up Physical Therapy or equipment needs. PT is signing off. Thank you for this referral.  Pt is with limited response to multi-modal stimuli; he is briefly able to make eye contact with right eye, keeps eyes close most of time; he is unable to consistently follow basic commands and unable to verbalize other than saying "ouch "to imposed movement and saying "um, hmm" repeatedly; pt is unabel to participate in therapy at this time; if status changes recommend re-ordering PT at that time; would also recommend palliative care consult to assist family in determining goals of care;      Follow Up Recommendations No PT follow up    Equipment Recommendations  None recommended by PT    Recommendations for Other Services       Precautions / Restrictions Precautions Precautions: Fall Restrictions Weight Bearing Restrictions: No      Mobility  Bed Mobility Overal bed mobility: Needs Assistance Bed Mobility: Rolling Rolling: Total assist         General bed mobility comments: attempted rolling right and left, hand over hand to reach for rail, bed pad utilized to complete turn, pt with very limited if any participation  Transfers                    Ambulation/Gait                Stairs            Wheelchair Mobility    Modified Rankin (Stroke Patients Only)       Balance                                              Pertinent Vitals/Pain Pain Assessment: Faces Faces Pain Scale: Hurts even more Pain Location: bil residual limbs with imposed movement Pain Descriptors / Indicators: Moaning Pain Intervention(s): Monitored during session    Home Living Family/patient expects to be discharged to:: Private residence Living Arrangements: Spouse/significant other   Type of Home: House Home Access: Level entry         Additional Comments: pt unable to give info; above taken from previous notes; no family present at time of PT eval    Prior Function                 Hand Dominance        Extremity/Trunk Assessment   Upper Extremity Assessment Upper Extremity Assessment: RUE deficits/detail;LUE deficits/detail RUE Deficits / Details: grimaces with end range shoulder flexion; PROm elbow, wrist grossly WFL; spontaneous movement osbserved LUE Deficits / Details: PROM WFL; spontaneous movment observed, no purposeful movement noted    Lower Extremity Assessment Lower Extremity Assessment: RLE deficits/detail;LLE deficits/detail RLE Deficits / Details: knee flexion/residual limb contracture RLE: Unable to fully assess due to pain LLE Deficits / Details: knee flexion/residual limb contracture LLE: Unable to fully assess due to pain  Communication   Communication: Expressive difficulties;Receptive difficulties  Cognition Arousal/Alertness: Lethargic Behavior During Therapy: Flat affect Overall Cognitive Status: Impaired/Different from baseline Area of Impairment: Following commands                       Following Commands: Follows one step commands inconsistently       General Comments: difficult to assess; pt only verbalizes "ouch" and "um,hmm" which he states repeatedly to most all questions;       General Comments      Exercises     Assessment/Plan    PT Assessment Patent does not need any further PT services  PT  Problem List         PT Treatment Interventions      PT Goals (Current goals can be found in the Care Plan section)  Acute Rehab PT Goals Patient Stated Goal: pt unable to state PT Goal Formulation: Patient unable to participate in goal setting    Frequency     Barriers to discharge        Co-evaluation               AM-PAC PT "6 Clicks" Daily Activity  Outcome Measure Difficulty turning over in bed (including adjusting bedclothes, sheets and blankets)?: Unable Difficulty moving from lying on back to sitting on the side of the bed? : Unable Difficulty sitting down on and standing up from a chair with arms (e.g., wheelchair, bedside commode, etc,.)?: Unable Help needed moving to and from a bed to chair (including a wheelchair)?: Total Help needed walking in hospital room?: Total Help needed climbing 3-5 steps with a railing? : Total 6 Click Score: 6    End of Session   Activity Tolerance: Other (comment);Patient limited by lethargy(AMS) Patient left: in bed;with call bell/phone within reach;with bed alarm set   PT Visit Diagnosis: Other symptoms and signs involving the nervous system (W29.937)    Time: 1696-7893 PT Time Calculation (min) (ACUTE ONLY): 15 min   Charges:   PT Evaluation $PT Eval Moderate Complexity: 1 Mod     PT G Codes:          Pranika Finks 12-Dec-2017, 1:59 PM

## 2017-12-02 NOTE — Progress Notes (Signed)
Nutrition Follow-up  DOCUMENTATION CODES:   Not applicable  INTERVENTION:  Continue NePro at 1m/hr, Pro-stat 348mBID  Provides 2576 calories, 137 grams of protein, 95948mree water  NUTRITION DIAGNOSIS:   Inadequate oral intake related to inability to eat(lethargy per SLP) as evidenced by NPO status. -ongoing  GOAL:   Patient will meet greater than or equal to 90% of their needs -met with TF  MONITOR:   TF tolerance, Diet advancement, Weight trends, I & O's  ASSESSMENT:   67 10o m with ESRD on HD TTS, Essential HTN, HCV, Afib on coumadin, complete heart block s/p ppm, bilateral BKA, Diabetes mellitus, Bipolar disorder, hx of renal stones, HFrEF takes midodrine at home, prior CVA who presented with acute encephalopathy, dysarthria, right facial droop, fatigue, and in septic shock. Patient had received only 3hrs af his last dialysis session rather than the usual 4hrs he usually gets.   Continued to be confused during my visit this AM. Going to [facility] today. Monitor mentation and dysphagia, may need GOC discussion and possible PEG tube if he does not improve. 5L Fluid Positive, HD today  Labs reviewed Medications reviewed and include:  Heparin gtt  Diet Order:   Diet Order           Diet NPO time specified Except for: Ice Chips  Diet effective now          EDUCATION NEEDS:   Not appropriate for education at this time  Skin:  Skin Assessment: Skin Integrity Issues: Skin Integrity Issues:: Stage I Stage I: sacrum  Last BM:  12/01/2017  Height:   Ht Readings from Last 1 Encounters:  11/27/17 '5\' 10"'  (1.778 m)    Weight:   Wt Readings from Last 1 Encounters:  12/02/17 198 lb 10.2 oz (90.1 kg)    Ideal Body Weight:  68.02 kg  BMI:  Body mass index is 28.5 kg/m.  Estimated Nutritional Needs:   Kcal:  2200-2600 calories  Protein:  130-150 grams  Fluid:  UOP +1L  WilSatira Anisard, MS, RD LDN Inpatient Clinical Dietitian Pager [phone]

## 2017-12-02 NOTE — Progress Notes (Signed)
Oscar Castillo spent time with wife of patient on 5/7 during the evening hours.  Chaplain Shulze attempted to see [name] yesterday evening however she was not present during the evening. We will continue to have chaplains reach out to [name] to provide ongoing support.     Caliegh Middlekauff, [place] [phone]

## 2017-12-02 NOTE — Progress Notes (Addendum)
[facility] Progress Note   Subjective: Pt opens eyes to verbal stimuli, says "hello" and then falls asleep. Possibly better than yesterday. Going to [facility] today. EEG negative for seizure activity. HD earlier this AM.   Objective Vitals:   12/02/17 0530 12/02/17 0555 12/02/17 0602 12/02/17 0645  BP: (!) 142/22 (!) 143/47 (!) 132/33 108/69  Pulse: 85 84 83 83  Resp: 17 (!) 21 19 18   Temp:   99 F (37.2 C) 99.1 F (37.3 C)  TempSrc:   Oral Oral  SpO2: 99% 100% 100% 100%  Weight:   90.2 kg (198 lb 13.7 oz) 90.1 kg (198 lb 10.2 oz)  Height:       Physical Exam General: Chronically ill appearing male in NAD Heart: S1,S2, RRR Lungs: CTAB.  Abdomen: active BS ND Extremities: Bilateral BKA no stump edema Neuro: Not following commands, responds to questions with "Um hmm".  Dialysis Access: [facility].    Additional Objective Labs: Basic Metabolic Panel: Recent Labs  Lab 12/01/17 0439 12/01/17 1632 12/02/17 0235 12/02/17 1013  NA 136  --  134* 139  K 3.8  --  3.8 4.1  CL 99*  --  97* 101  CO2 24  --  26 26  GLUCOSE 112*  --  129* 132*  BUN 19  --  35* 17  CREATININE 6.62*  --  8.20* 5.02*  CALCIUM 9.2  --  9.3 9.3  PHOS 4.3 4.8* 5.1* 2.6   Liver Function Tests: Recent Labs  Lab 12/01/17 0439 12/02/17 0235  AST 14*  --   ALT 8*  --   ALKPHOS 39  --   BILITOT 0.6  --   PROT 7.8  --   ALBUMIN 2.9* 2.8*   No results for input(s): LIPASE, AMYLASE in the last 168 hours. CBC: Recent Labs  Lab 11/28/17 0347 11/29/17 0403 11/30/17 0517 12/01/17 0439 12/02/17 0234  WBC 6.1 6.2 5.4 6.7 6.9  HGB 12.0* 12.4* 11.5* 12.2* 12.0*  HCT 35.7* 37.2* 34.8* 37.8* 37.0*  MCV 89.3 90.1 89.2 89.8 89.8  PLT 320 276 276 318 310   Blood Culture    Component Value Date/Time   SDES CSF 11/25/2017 1116   SPECREQUEST NONE 11/25/2017 1116   CULT  11/25/2017 1116    NO GROWTH 3 DAYS Performed at [facility], [place] [address], [place], [place]  [address]    REPTSTATUS 11/28/2017 FINAL 11/25/2017 1116    Cardiac Enzymes: No results for input(s): CKTOTAL, CKMB, CKMBINDEX, TROPONINI in the last 168 hours. CBG: Recent Labs  Lab 12/01/17 2040 12/02/17 0019 12/02/17 0645 12/02/17 0806 12/02/17 1200  GLUCAP 115* 120* 121* 117* 158*   Studies/Results: No results found. Medications: . sodium chloride    . sodium chloride    . sodium chloride    . heparin 1,000 Units/hr (12/02/17 1138)   . amiodarone  200 mg Per Tube Daily  . atorvastatin  40 mg Per Tube QHS  . [START ON 12/03/2017] famotidine  20 mg Per Tube Daily  . feeding supplement (NEPRO CARB STEADY)  1,000 mL Per Tube Q24H  . feeding supplement (PRO-STAT SUGAR FREE 64)  30 mL Per Tube BID  . latanoprost  1 drop Both Eyes QHS  . mouth rinse  15 mL Mouth Rinse BID  . midodrine  5 mg Per Tube BID WC  . QUEtiapine  12.5 mg Per Tube QHS  . sodium chloride flush  3 mL Intravenous Q12H  Dialysis Orders: TTS at [facility] 4h 91.5kg 2/2.5 bath p4 Hep 4000+ 2000 midrun Left chest tdc Venofer 50mg  IV q week   Assessment/Plan: 1. Metabolic encephalopathy/possbile viral encephalitis: Presented with AMS/Sepsis. No clear etiology. Neurology following, EEG negative to seizure activity. ABX dc'd 11/28/17 per ID. Cx's negative resp pcr neg and csf unremarkable. AMS still variable 2. ESRD HD T,Th,S- using tunneled hd cath, blood cx's negative.HD today. No heparin-on heparin gtt. K+ 3.8 use 4.0 K bath 3. Hypotension/Volume: Chronic hypotension on midodrine. HD this AM Net UF 2018 Post wt 90.1 kg. Under OP EDW.  4. HGB11.5No ESA. Follow HGB. 5. Afib-Rate controlled on amiodarone,on heparin gttper pharmacy 6. T2DM:per primary 7. CKD BMD: Renal function panel with HD tomorrow. 8. hx cva and/or seizures - started on keppra here, mri head negative. EEG 3 day intensive was negative for clinical or subclinical seizure activity.   9. Moderate protein malnutrition- low albumin  and poor po intake 10. Disposition- pt has had FTT as well as worsening depression over the last few months.  He has a poor prognosis given his poor functional/nutritional status and multiple comorbidities.  Recommend palliative care consult to help set goals/limits of care and primary attended to get palliative care consult. However wife believes he will get better and refused. He will need time and intensive therapy plus dialysis. He has been referred to [facility], to be discharged there today.     Rita H. Brown NP-C 12/02/2017, 12:24 PM  [facility] [phone]  I have seen and examined this patient and agree with plan and assessment in the above note with renal recommendations/intervention highlighted.  Governor Rooks Rasheen Bells,MD 12/02/2017 12:43 PM

## 2017-12-02 NOTE — Progress Notes (Signed)
HD tx completed w/o problem, UF goal met, blood rinsed back, VSS, report called to Alva Garnet, RN

## 2017-12-02 NOTE — Progress Notes (Signed)
ANTICOAGULATION CONSULT NOTE - Follow-Up  Pharmacy Consult for Heparin Indication: atrial fibrillation  Allergies  Allergen Reactions  . Ceftriaxone Sodium In Dextrose Anaphylaxis  . Ciprofloxacin Other (See Comments)    UNSPECIFIED PAIN  . Nsaids Other (See Comments)    HISTORY OF ULCER  . Penicillins Swelling    SWELLING REACTION UNSPECIFIED  [ PATIENT WITH HX OF ANAPHYLAXIS TO CEFTRIAXONE ] 11/26/17:  pt has received 3 doses of zosyn in past (04/2017.) No allergy tor adverse event to zosyn noted at that time.     . Sulfa Antibiotics Swelling    SWELLING REACTION UNSPECIFIED     Patient Measurements: Height: 5\' 10"  (177.8 cm)(on 10/25/2017) Weight: 198 lb 10.2 oz (90.1 kg) IBW/kg (Calculated) : 73 Heparin Dosing Weight: 88.9 kg  Vital Signs: Temp: 99.1 F (37.3 C) (05/09 0645) Temp Source: Oral (05/09 0645) BP: 108/69 (05/09 0645) Pulse Rate: 83 (05/09 0645)  Labs: Recent Labs    11/30/17 0517 12/01/17 0439 12/02/17 0234 12/02/17 0235 12/02/17 1009  HGB 11.5* 12.2* 12.0*  --   --   HCT 34.8* 37.8* 37.0*  --   --   PLT 276 318 310  --   --   LABPROT 24.8* 21.2*  --   --  18.2*  INR 2.26 1.86  --   --  1.53  HEPARINUNFRC 0.55 0.64  --   --  0.96*  CREATININE 9.59* 6.62*  --  8.20*  --     Estimated Creatinine Clearance: 10 mL/min (A) (by C-G formula based on SCr of 8.2 mg/dL (H)).   Medical History: Past Medical History:  Diagnosis Date  . Anemia   . Antral ulcer 2014   small  . BENIGN PROSTATIC HYPERTROPHY   . Bipolar disorder ([place])    "sometimes" (10/07/2016)  . CHOLELITHIASIS   . Chronic combined systolic and diastolic CHF (congestive heart failure) ([place])   . Complication of anesthesia    wife states pt had trouble waking up with in Nov., 2014  . CVA (cerebral vascular accident) ([place]) 07/2007   No residual effect  . DEPRESSION   . DIABETES MELLITUS, TYPE II    diet control.  No medication  since November 2015  . ERECTILE DYSFUNCTION   . ESRD on  hemodialysis ([place])    ESRD due to DM/HTN. .  [facility] [place]  [name] GERD   . GI bleed    due to gastritis, discharged 10/02/16/notes 10/07/2016  . Hepatitis C    C - has been treated  . History of Clostridium difficile   . History of kidney stones   . HYPERTENSION   . LBBB (left bundle branch block)   . Morbid obesity ([place])   . PAF (paroxysmal atrial fibrillation) ([place])    a. Dx 12/2015.    Assessment: 67 year old male on Coumadin 2.5mg  daily except 1.25mg  on Sat PTA for Afib presenting as code stroke, presentation felt to be most consistent with sepsis per Neuro. Coumadin held on admission for LP (last dose PTA), now s/p reversal with Vitamin K 5mg  IVx1. Received Coumadin x 1 dose 5/2 then held again given NPO status. Coretrack placed 5/6 and pt has started on tube feedings. INR down to 1.53.  Coumadin continues to be held pending plans / disposition.  Unclear at this time if pt will need PEG prior to discharge.  Heparin level is supra therapeutic on 1200 units/hr   Goal of Therapy:  INR 2-3 Heparin  level 0.3-0.7 units/ml Monitor platelets by anticoagulation protocol: Yes   Plan:  Reduce heparin to 1000 units/hr  Next heparin level in 8 hours Monitor daily INR / heparin level, CBC, s/s of bleed F/U restart of Coumadin when able.  Can be given via tube if desired.  Unclear of current plan and if further procedures needed.  [employer], Pharm.D., BCPS Clinical Pharmacist Pager: [phone] Clinical phone for 12/02/2017 from 8:30-4:00 is [phone]. After 4pm, please call Main [facility] [phone]) for assistance. 12/02/2017 11:33 AM

## 2017-12-02 NOTE — Progress Notes (Signed)
Spiritual Care consult acknowledged. I apologize for this note being completed days after the visit.  Chaplain went to Pt.'s room read scriptures and hummed hymns. I waited for the Pt.'s spouse to arrive and when she did she was greeted warmly. Chaplain offered empathic listening, prayer, ministry of presence and scripture.  Pt.'s - Oscar Castillo- reported feeling encouraged from the time spent. The Pt. Responded "Amen"

## 2017-12-02 NOTE — Discharge Summary (Signed)
[facility] Discharge Summary   Patient: Oscar Castillo WVP:[id number]   PCP: [name], MD DOB: [birth year]   Date of admission: 11/21/2017   Date of discharge:  12/02/2017    Discharge Diagnoses:  Principal Problem:   Toxic metabolic encephalopathy Active Problems:   Severe sepsis ([place])   Septic shock ([place])   Pulmonary edema   Pressure injury of skin   Admitted From: home Disposition:  [facility]   Recommendations for Outpatient Follow-up:  1. Please follow up with PCP once out of [facility]   Follow-up Information    [name], MD. Schedule an appointment as soon as possible for a visit in 1 week(s).   Specialties:  Internal Medicine, Radiology Contact information: [address] [place] [address] [phone]          Diet recommendation: on tube feeding  Activity: The patient is advised to gradually reintroduce usual activities.  Discharge Condition: fair  Code Status: full code  History of present illness: As per the H and P dictated on admission, "this 67 year old man with medical problems including end-stage renal disease, formerly poorly controlled diabetes, history of AV block heart pacer, atrial fibrillation on vitamin K antagonists, bilateral BKA, who presents with confusion.  History is obtained via EMR review as well as discussion with the wife is present at the bedside. Patient cannot report history due to altered mental status. Wife reports he has early dementia but over the past 2 days he's had worsening of his mental status. Day prior to admission he did not know what to do with the basin and towel with which he washes daily, on the day of admission the wife reports continued confusion prompting him to seek medical care. At baseline, he is alert and oriented but does require ADL assistance.  Last dialysis session was Saturday prior to admission, only performed for about 30 minutes to transportation issues. Wife reports that he was  trembling and shaking uncontrollably like he was cold as well as decreased appetite over the past few days.Denies any new skin lesions, does have a small skin lesion healing over the left stump. Takes midodrine at home, wife reports baseline systolic blood pressure in the 120s.  ED Course: in the emergency department vital signs remarkable for fever to 104.7, tachycardic to 110, increase respiratory rate 27, systolic blood pressures which range from 143 to a nadir of 75 millimeters mercury. Due to some concern over stroke, code stroke was called due to report of tremulousness and possible facial droop.  Started on Keppra. Hospital medicine consult further management. "  Hospital Course:  Summary of his active problems in the hospital is as following. Metabolic encephalopathy with suspected viral encephalitis CSF WBC 0 Persistent encephalopathy with no seizure activity detected on EEG Neurology following. Highly appreciated.   Treated with high dose thiamine. Pt's wife suggest that the pt showed improvement in mentation on night of 12/01/2017. Now back to confusion in morning.  Neurology is currently signed out but recommend psychiatric consult, given that the patient has shown improvement with current regimen will hold off on consulting psych.  Chronic A. fib, stable Rate controlled On Coumadin at home, on hold for now, if there is improvement in mentation can start it back. Currently on heparin drip Continue current medications C/w amiodarone  Type 2 diabetes, stable insulin sliding scale held due to hypoglycemia Avoid hypoglycemia NG Tube feeding in place  Dysphagia, possibly from encephalopathy/previous CVA NG tube feeding in  place, if ongoing dysphagia without any improvement in mental status, then pt may need Palliative care consult for goals of care, may need PEG tube. Currently keeping heparin ongoing and coumadin on hold for the same.  Elevate head of bed 35  degrees Aspiration precautions  History of CVA and seizures Continuous Video EEG was not suggestive of any active seizures.  Continue current management  ESRD on HD Tuesday Thursday Saturday Nephrology following  Chronic depression Continue Seroquel  Anemia of chronic disease Nephrology following Hemoglobin stable  GERD C/w pepcid  All other chronic medical condition were stable during the hospitalization.  Patient was seen by physical therapy, LTAC was arranged by Education officer, museum and case Freight forwarder. On the day of the discharge the patient's vitals were stable, and no other acute medical condition were reported by patient. the patient was felt safe to be discharge at [facility] with therapy.  Consultants: PCCM, nephrology, neurology, ID Procedures: LTM continuous EEG monitoring HD LP EEG  DISCHARGE MEDICATION: Allergies as of 12/02/2017      Reactions   Ceftriaxone Sodium In Dextrose Anaphylaxis   Ciprofloxacin Other (See Comments)   UNSPECIFIED PAIN   Nsaids Other (See Comments)   HISTORY OF ULCER   Penicillins Swelling   SWELLING REACTION UNSPECIFIED  [ PATIENT WITH HX OF ANAPHYLAXIS TO CEFTRIAXONE ] 11/26/17:  pt has received 3 doses of zosyn in past (04/2017.) No allergy tor adverse event to zosyn noted at that time.     Sulfa Antibiotics Swelling   SWELLING REACTION UNSPECIFIED       Medication List    STOP taking these medications   acetaminophen 500 MG tablet Commonly known as:  TYLENOL Replaced by:  acetaminophen 160 MG/5ML solution   gabapentin 300 MG capsule Commonly known as:  NEURONTIN   oxyCODONE-acetaminophen 5-325 MG tablet Commonly known as:  PERCOCET/ROXICET   pantoprazole 40 MG tablet Commonly known as:  PROTONIX   pentoxifylline 400 MG CR tablet Commonly known as:  TRENTAL   senna 8.6 MG Tabs tablet Commonly known as:  SENOKOT   SENSIPAR 30 MG tablet Generic drug:  cinacalcet   traMADol 50 MG tablet Commonly known as:  ULTRAM    warfarin 2.5 MG tablet Commonly known as:  COUMADIN     TAKE these medications   acetaminophen 160 MG/5ML solution Commonly known as:  TYLENOL Place 20.3 mLs (650 mg total) into feeding tube every 6 (six) hours as needed for headache or fever. Replaces:  acetaminophen 500 MG tablet   amiodarone 200 MG tablet Commonly known as:  PACERONE Place 1 tablet (200 mg total) into feeding tube daily. Start taking on:  12/03/2017 What changed:  how to take this   atorvastatin 40 MG tablet Commonly known as:  LIPITOR Place 1 tablet (40 mg total) into feeding tube at bedtime. What changed:  See the new instructions.   famotidine 40 MG/5ML suspension Commonly known as:  PEPCID Place 2.5 mLs (20 mg total) into feeding tube daily. Start taking on:  12/03/2017   feeding supplement (NEPRO CARB STEADY) Liqd Place 1,000 mLs into feeding tube daily.   feeding supplement (PRO-STAT SUGAR FREE 64) Liqd Place 30 mLs into feeding tube 2 (two) times daily.   heparin 100-0.45 UNIT/ML-% infusion Inject 1,000 Units/hr into the vein continuous.   latanoprost 0.005 % ophthalmic solution Commonly known as:  XALATAN Place 1 drop into both eyes at bedtime.   midodrine 5 MG tablet Commonly known as:  PROAMATINE Place 1 tablet (5 mg total)  into feeding tube 2 (two) times daily with a meal. What changed:  See the new instructions.   PHOSLO 667 MG capsule Generic drug:  calcium acetate Take 1,334 mg by mouth 2 (two) times daily.   QUEtiapine 25 MG tablet Commonly known as:  SEROQUEL Take 0.5 tablets (12.5 mg total) by mouth at bedtime.   silver sulfADIAZINE 1 % cream Commonly known as:  SSD APPLY EXTERNALLY TO THE AFFECTED AREA DAILY What changed:    how much to take  how to take this  when to take this  reasons to take this  additional instructions   sorbitol 70 % Soln Take 30 mLs by mouth 2 (two) times daily. What changed:    when to take this  reasons to take this       Allergies  Allergen Reactions  . Ceftriaxone Sodium In Dextrose Anaphylaxis  . Ciprofloxacin Other (See Comments)    UNSPECIFIED PAIN  . Nsaids Other (See Comments)    HISTORY OF ULCER  . Penicillins Swelling    SWELLING REACTION UNSPECIFIED  [ PATIENT WITH HX OF ANAPHYLAXIS TO CEFTRIAXONE ] 11/26/17:  pt has received 3 doses of zosyn in past (04/2017.) No allergy tor adverse event to zosyn noted at that time.     . Sulfa Antibiotics Swelling    SWELLING REACTION UNSPECIFIED    Discharge Instructions    Increase activity slowly   Complete by:  As directed      Discharge Exam: Filed Weights   12/02/17 0145 12/02/17 0602 12/02/17 0645  Weight: 92.2 kg (203 lb 4.2 oz) 90.2 kg (198 lb 13.7 oz) 90.1 kg (198 lb 10.2 oz)   Vitals:   12/02/17 0602 12/02/17 0645  BP: (!) 132/33 108/69  Pulse: 83 83  Resp: 19 18  Temp: 99 F (37.2 C) 99.1 F (37.3 C)  SpO2: 100% 100%   General: Appear in mild distress, no Rash; Oral Mucosa moist. Cardiovascular: S1 and S2 Present, no Murmur, difficult to assess  JVD Respiratory: Bilateral Air entry present and Clear to Auscultation, no Crackles, no wheezes Abdomen: Bowel Sound present, Soft and no tenderness Extremities: bilateral BKA Neurology: responds to verbal stimuli. Would respond to questions. Speech output without gross receptive or expressive aphasia. Mild dysarthria.     The results of significant diagnostics from this hospitalization (including imaging, microbiology, ancillary and laboratory) are listed below for reference.    Significant Diagnostic Studies: Dg Chest 2 View  Result Date: 11/22/2017 CLINICAL DATA:  Pulmonary edema No chest pain or SOB EXAM: CHEST - 2 VIEW COMPARISON:  the previous day's study FINDINGS: Lungs are clear. Stable tunneled left IJ hemodialysis catheter to the right atrium. Stable dual lead right subclavian pacemaker. Heart size and mediastinal contours are within normal limits. Tortuous thoracic aorta.  No effusion. Visualized bones unremarkable. IMPRESSION: No acute cardiopulmonary disease. Electronically Signed   By: Lucrezia Europe M.D.   On: 11/22/2017 20:51   Mr Brain Wo Contrast  Result Date: 11/26/2017 CLINICAL DATA:  Initial evaluation for progressive encephalopathy the, unclear cause. EXAM: MRI HEAD WITHOUT CONTRAST TECHNIQUE: Multiplanar, multiecho pulse sequences of the brain and surrounding structures were obtained without intravenous contrast. COMPARISON:  Previous brain MRI from 11/22/2017. FINDINGS: Brain: Generalized age-related cerebral atrophy with advanced chronic microvascular ischemic disease, stable. Multiple small remote lacunar infarcts present within the bilateral thalami and pons. Chronic brainstem atrophy again noted. No abnormal foci of restricted diffusion to suggest acute or subacute ischemia. Gray-white matter differentiation maintained. No areas  of remote cortical infarction. No acute intracranial hemorrhage. Multiple chronic micro hemorrhages present within the bilateral thalami, consistent with chronic small vessel ischemic changes and/or hypertension. No mass lesion, midline shift or mass effect. Diffuse ventricular prominence related global parenchymal volume loss without hydrocephalus. No extra-axial fluid collection. Pituitary gland suprasellar region within normal limits. Midline structures intact and normal. Vascular: Major intracranial vascular flow voids are maintained. Skull and upper cervical spine: Craniocervical junction within normal limits. Degenerative spondylolysis noted at C3-4 and C4-5 without significant stenosis. Bone marrow signal intensity stable and within normal limits. No scalp soft tissue abnormality. Sinuses/Orbits: Globes and orbital soft tissues are stable and within normal limits. Patient status post lens extraction bilaterally. Paranasal sinuses remain largely clear. Other: Left mastoid effusion again noted, slightly progressed from previous. Small  right mastoid effusion now evident as well. Nasopharynx remains clear. IMPRESSION: 1. Stable appearance of the brain with no acute intracranial abnormality identified. 2. Chronic atrophy with advanced chronic small vessel ischemic disease within the thalami and pons with associated chronic brainstem atrophy. 3. Slightly progressive left mastoid effusion with new small right mastoid effusion. Electronically Signed   By: Jeannine Boga M.D.   On: 11/26/2017 13:27   Mr Brain Wo Contrast  Result Date: 11/22/2017 CLINICAL DATA:  67 year old male with confusion. Code stroke presentation yesterday with right side facial droop. EXAM: MRI HEAD WITHOUT CONTRAST TECHNIQUE: Multiplanar, multiecho pulse sequences of the brain and surrounding structures were obtained without intravenous contrast. COMPARISON:  Head CT without contrast 11/21/2017 and earlier. Brain MRI 12/27/2015. FINDINGS: Brain: No restricted diffusion to suggest acute infarction. No midline shift, mass effect, evidence of mass lesion, ventriculomegaly, extra-axial collection or acute intracranial hemorrhage. Cervicomedullary junction and pituitary are within normal limits. Stable cerebral volume since 2017 with disproportionate atrophy of the brainstem. There is stable T2 heterogeneity throughout the mid and dorsal pons compatible with chronic lacunar infarcts. Cerebellar volume appears better preserved. Advanced chronic lacunar infarcts in the bilateral thalami some with associated chronic microhemorrhage. The basal ganglia are relatively spared. There is chronic Patchy and confluent supratentorial cerebral white matter T2 and FLAIR hyperintensity. No new signal abnormality compared to the 2017 MRI. Vascular: Major intracranial vascular flow voids are stable. Skull and upper cervical spine: Stable and negative. Normal bone marrow signal. Sinuses/Orbits: Interval postoperative changes to the right globe. Otherwise stable and negative. Other: A  mild-to-moderate left mastoid effusion is new since 2018. Negative nasopharynx. The right mastoids remain clear. Grossly normal other visible internal auditory structures. Scalp and face soft tissues appear negative. IMPRESSION: 1.  No acute intracranial abnormality. 2. Chronic brainstem atrophy with evidence of advanced chronic small vessel disease in the thalami and pons appears stable since a 2017 MRI. 3. Left mastoid effusion is new since 2018 and probably postinflammatory. Electronically Signed   By: Genevie Ann M.D.   On: 11/22/2017 11:28   Dg Chest Port 1 View  Result Date: 11/21/2017 CLINICAL DATA:  Fever and altered mentation. EXAM: PORTABLE CHEST 1 VIEW COMPARISON:  07/07/2017 FINDINGS: Dual lumen left IJ dialysis catheter is stable with tip in the right atrium. Right-sided pacemaker apparatus with right atrial and right ventricular leads redemonstrated. Clear lungs. Normal size cardiac and mediastinal contours. No acute nor suspicious osseous abnormality. IMPRESSION: No active cardiopulmonary disease. Electronically Signed   By: Ashley Royalty M.D.   On: 11/21/2017 21:06   Dg Abd Portable 1v  Result Date: 11/29/2017 CLINICAL DATA:  Feeding tube placement. EXAM: PORTABLE ABDOMEN - 1 VIEW COMPARISON:  CT abdomen pelvis dated May 06, 2017. FINDINGS: Feeding tube tip in the gastric body. There are few mildly dilated loops of small bowel in the central abdomen. Large amount of stool in the rectum. No acute osseous abnormality. IMPRESSION: 1. Feeding tube tip in the gastric body. 2. Non-specific bowel gas pattern may reflect mild ileus. Electronically Signed   By: Titus Dubin M.D.   On: 11/29/2017 11:19   Ct Head Code Stroke Wo Contrast  Result Date: 11/21/2017 CLINICAL DATA:  Code stroke. Right-sided facial droop. Not following commands. EXAM: CT HEAD WITHOUT CONTRAST TECHNIQUE: Contiguous axial images were obtained from the base of the skull through the vertex without intravenous contrast.  COMPARISON:  07/07/2017 FINDINGS: Brain: No evidence of acute infarction, hemorrhage, hydrocephalus, extra-axial collection or mass lesion/mass effect. Atrophy, most notable at the brainstem. Extensive chronic small vessel ischemic change in the cerebral white matter. Vascular: Diffuse atherosclerotic calcification. No hyperdense vessel. Skull: No acute or aggressive finding Sinuses/Orbits: Bilateral cataract resection.  No acute finding Other: These results were communicated to Dr. Lorraine Lax at 6:35 pmon 4/28/2019by text page via the [facility] messaging system. ASPECTS [facility] Stroke Program Early CT Score) - Ganglionic level infarction (caudate, lentiform nuclei, internal capsule, insula, M1-M3 cortex): 7 - Supraganglionic infarction (M4-M6 cortex): 3 Total score (0-10 with 10 being normal): 10 IMPRESSION: 1. No acute finding. 2. Atrophy most prominent at the brainstem. Extensive chronic small vessel ischemia. Electronically Signed   By: Monte Fantasia M.D.   On: 11/21/2017 18:39    Microbiology: Recent Results (from the past 240 hour(s))  CSF culture with Stat gram stain     Status: None   Collection Time: 11/25/17 11:16 AM  Result Value Ref Range Status   Specimen Description CSF  Final   Special Requests NONE  Final   Gram Stain   Final    CYTOSPIN SMEAR WBC PRESENT, PREDOMINANTLY MONONUCLEAR NO ORGANISMS SEEN    Culture   Final    NO GROWTH 3 DAYS Performed at [facility], [address], [place], [place] [address]    Report Status 11/28/2017 FINAL  Final     Labs: CBC: Recent Labs  Lab 11/28/17 0347 11/29/17 0403 11/30/17 0517 12/01/17 0439 12/02/17 0234  WBC 6.1 6.2 5.4 6.7 6.9  HGB 12.0* 12.4* 11.5* 12.2* 12.0*  HCT 35.7* 37.2* 34.8* 37.8* 37.0*  MCV 89.3 90.1 89.2 89.8 89.8  PLT 320 276 276 318 102   Basic Metabolic Panel: Recent Labs  Lab 11/29/17 0403  11/30/17 0517 11/30/17 1858 12/01/17 0439 12/01/17 1632 12/02/17 0235 12/02/17 1013  NA 136  --  137  --   136  --  134* 139  K 3.7  --  3.6  --  3.8  --  3.8 4.1  CL 95*  --  100*  --  99*  --  97* 101  CO2 22  --  23  --  24  --  26 26  GLUCOSE 62*  --  91  --  112*  --  129* 132*  BUN 23*  --  33*  --  19  --  35* 17  CREATININE 7.85*  --  9.59*  --  6.62*  --  8.20* 5.02*  CALCIUM 8.8*  --  8.7*  --  9.2  --  9.3 9.3  MG  --   --  2.0 2.1 2.0 2.1  --  1.9  PHOS  --    < > 6.8* 3.3 4.3  4.8* 5.1* 2.6   < > = values in this interval not displayed.   Liver Function Tests: Recent Labs  Lab 12/01/17 0439 12/02/17 0235  AST 14*  --   ALT 8*  --   ALKPHOS 39  --   BILITOT 0.6  --   PROT 7.8  --   ALBUMIN 2.9* 2.8*   No results for input(s): LIPASE, AMYLASE in the last 168 hours. Recent Labs  Lab 11/28/17 1927  AMMONIA 16   Cardiac Enzymes: No results for input(s): CKTOTAL, CKMB, CKMBINDEX, TROPONINI in the last 168 hours. BNP (last 3 results) No results for input(s): BNP in the last 8760 hours. CBG: Recent Labs  Lab 12/01/17 2040 12/02/17 0019 12/02/17 0645 12/02/17 0806 12/02/17 1200  GLUCAP 115* 120* 121* 117* 158*   Time spent: 35 minutes  Signed:  Berle Mull  [facility]  12/02/2017  , 12:18 PM

## 2017-12-02 NOTE — Progress Notes (Signed)
Oscar Castillo Progress Note    Subjective:   Will open his eyes and speak in one word responses but remains lethargic and delirious   Objective:   BP 108/69 (BP Location: Left Arm)   Pulse 83   Temp 99.1 F (37.3 C) (Oral)   Resp 18   Ht 5\' 10"  (1.778 m) Comment: on 10/25/2017  Wt 90.1 kg (198 lb 10.2 oz)   SpO2 100%   BMI 28.50 kg/m   Intake/Output: I/O last 3 completed shifts: In: 3888.1 [I.V.:1114.2; NG/GT:2673.9; IV Piggyback:100] Out: 2018 [Other:2018]   Intake/Output this shift:  No intake/output data recorded. Weight change: -0.8 kg (-1 lb 12.2 oz)  Physical Exam: Gen:NAD, chronically ill-appearing CVS: no rub Resp: cta Abd: benign Ext: minimal edema of hands bilaterally  Labs: BMET Recent Labs  Lab 11/27/17 0501 11/28/17 0347 11/29/17 0403 11/30/17 0517 11/30/17 1858 12/01/17 0439 12/01/17 1632 12/02/17 0235 12/02/17 1013  NA 135 134* 136 137  --  136  --  134* 139  K 4.0 3.8 3.7 3.6  --  3.8  --  3.8 4.1  CL 94* 94* 95* 100*  --  99*  --  97* 101  CO2 22 23 22 23   --  24  --  26 26  GLUCOSE 68 67 62* 91  --  112*  --  129* 132*  BUN 31* 15 23* 33*  --  19  --  35* 17  CREATININE 7.99* 5.91* 7.85* 9.59*  --  6.62*  --  8.20* 5.02*  ALBUMIN  --   --   --   --   --  2.9*  --  2.8*  --   CALCIUM 8.8* 8.6* 8.8* 8.7*  --  9.2  --  9.3 9.3  PHOS  --   --   --  6.8* 3.3 4.3 4.8* 5.1* 2.6   CBC Recent Labs  Lab 11/29/17 0403 11/30/17 0517 12/01/17 0439 12/02/17 0234  WBC 6.2 5.4 6.7 6.9  HGB 12.4* 11.5* 12.2* 12.0*  HCT 37.2* 34.8* 37.8* 37.0*  MCV 90.1 89.2 89.8 89.8  PLT 276 276 318 310    @IMGRELPRIORS @ Medications:    . amiodarone  200 mg Per Tube Daily  . atorvastatin  40 mg Per Tube QHS  . [START ON 12/03/2017] famotidine  20 mg Per Tube Daily  . feeding supplement (NEPRO CARB STEADY)  1,000 mL Per Tube Q24H  . feeding supplement (PRO-STAT SUGAR FREE 64)  30 mL Per Tube BID  . latanoprost  1 drop Both Eyes QHS  .  mouth rinse  15 mL Mouth Rinse BID  . midodrine  5 mg Per Tube BID WC  . QUEtiapine  12.5 mg Per Tube QHS  . sodium chloride flush  3 mL Intravenous Q12H   Dialysis Orders: TTS at [facility] 4h 91.5kg 2/2.5 bath p4 Hep 4000+ 2000 midrun Left chest tdc Venofer 50mg  IV q week    Assessment/ Plan:   1. Metabolic encephalopathy/possbile viral encephalitis: Presented with AMS/Sepsis. No clear etiology. Neurology following. ABX dc'd 11/28/17 per ID. Cx's negative resp pcr neg and csf unremarkable. AMS still variable, possibly improving. 2. ESRD HD T,Th,S- using tunneled hd cath, blood cx's negative.HD today. No heparin-on heparin gtt. K+ 3.8 use 4.0 K bath 3. Hypotension/Volume: Chronic hypotension on midodrine. HD 11/27/17 Pre wt 92.3 kg net UF 1.0 liter Post wt 88.9 kg. Now under OP EDW. BP stable. No stump edema but trace edema upper thighs and hand. Lower  EDW-UFG 2.5-3 liters at tolerated. 4. HGB11.5No ESA. Follow HGB. 5. Afib-Rate controlled on amiodarone,on heparin gttper pharmacy 6. T2DM:per primary 7. CKD BMD: Renal function panel with HD tomorrow. 8. hx cva and/or seizures - started on keppra here, mri head negative. EEG 3 day intensive was negative for clinical or subclinical seizure activity.   9. Moderate protein malnutrition- low albumin and poor po intake 10. Disposition- pt has had FTT as well as worsening depression over the last few months.  He has a poor prognosis given his poor functional/nutritional status and multiple comorbidities.  Recommend palliative care consult to help set goals/limits of care.    Donetta Potts, MD [place] Pager [phone] 12/02/2017, 12:11 PM

## 2017-12-02 NOTE — Progress Notes (Signed)
HD tx initiated via HD cath w/o problem, pull/push/flush equally w/o problem, VSS, will cont to monitor while on HD tx 

## 2017-12-03 LAB — HEPATITIS B SURFACE ANTIGEN: Hepatitis B Surface Ag: NEGATIVE

## 2017-12-06 ENCOUNTER — Ambulatory Visit (INDEPENDENT_AMBULATORY_CARE_PROVIDER_SITE_OTHER): Payer: Medicare Other | Admitting: Orthopedic Surgery

## 2017-12-07 ENCOUNTER — Encounter: Payer: Medicare Other | Admitting: *Deleted

## 2017-12-07 ENCOUNTER — Telehealth: Payer: Self-pay | Admitting: Cardiology

## 2017-12-07 DIAGNOSIS — R627 Adult failure to thrive: Secondary | ICD-10-CM | POA: Diagnosis not present

## 2017-12-07 DIAGNOSIS — R1310 Dysphagia, unspecified: Secondary | ICD-10-CM | POA: Diagnosis not present

## 2017-12-07 NOTE — Telephone Encounter (Signed)
Attempted to confirm remote transmission with pt. No answer and was unable to leave a message.   

## 2017-12-09 ENCOUNTER — Encounter: Payer: Self-pay | Admitting: Cardiology

## 2017-12-17 ENCOUNTER — Telehealth: Payer: Self-pay | Admitting: Internal Medicine

## 2017-12-17 NOTE — Telephone Encounter (Signed)
Called patient's wife regarding his remote transmissions. Wife states that patient was in the hospital for several days, and has been discharged to [facility] for further care. Wife wanted to know what patient should do about sending remote transmissions. I told patient's wife that she could take his monitor to the facility since she's unsure how long patient will be there. Remote scheduled for 6/4. Wife aware that remote should come through automatically. I told wife that we would call and walk her through a manual transmission if it's not received. Wife verbalized understanding.

## 2017-12-17 NOTE — Telephone Encounter (Signed)
New Message:      Pt is hospitalized and unable to send transmits. Pt's wife is calling to see what needs to be done.

## 2017-12-22 DIAGNOSIS — Z8249 Family history of ischemic heart disease and other diseases of the circulatory system: Secondary | ICD-10-CM | POA: Diagnosis not present

## 2017-12-22 DIAGNOSIS — S069X9A Unspecified intracranial injury with loss of consciousness of unspecified duration, initial encounter: Secondary | ICD-10-CM | POA: Diagnosis not present

## 2017-12-22 DIAGNOSIS — G9349 Other encephalopathy: Secondary | ICD-10-CM | POA: Diagnosis not present

## 2017-12-22 DIAGNOSIS — E118 Type 2 diabetes mellitus with unspecified complications: Secondary | ICD-10-CM | POA: Diagnosis not present

## 2017-12-22 DIAGNOSIS — R41841 Cognitive communication deficit: Secondary | ICD-10-CM | POA: Diagnosis not present

## 2017-12-22 DIAGNOSIS — Z833 Family history of diabetes mellitus: Secondary | ICD-10-CM | POA: Diagnosis not present

## 2017-12-22 DIAGNOSIS — K9423 Gastrostomy malfunction: Secondary | ICD-10-CM | POA: Diagnosis not present

## 2017-12-22 DIAGNOSIS — Z7901 Long term (current) use of anticoagulants: Secondary | ICD-10-CM | POA: Diagnosis not present

## 2017-12-22 DIAGNOSIS — M2550 Pain in unspecified joint: Secondary | ICD-10-CM | POA: Diagnosis not present

## 2017-12-22 DIAGNOSIS — Z5181 Encounter for therapeutic drug level monitoring: Secondary | ICD-10-CM | POA: Diagnosis not present

## 2017-12-22 DIAGNOSIS — R6520 Severe sepsis without septic shock: Secondary | ICD-10-CM | POA: Diagnosis not present

## 2017-12-22 DIAGNOSIS — R0902 Hypoxemia: Secondary | ICD-10-CM | POA: Diagnosis not present

## 2017-12-22 DIAGNOSIS — I4891 Unspecified atrial fibrillation: Secondary | ICD-10-CM | POA: Diagnosis not present

## 2017-12-22 DIAGNOSIS — T849XXA Unspecified complication of internal orthopedic prosthetic device, implant and graft, initial encounter: Secondary | ICD-10-CM | POA: Diagnosis not present

## 2017-12-22 DIAGNOSIS — E785 Hyperlipidemia, unspecified: Secondary | ICD-10-CM | POA: Diagnosis present

## 2017-12-22 DIAGNOSIS — Z794 Long term (current) use of insulin: Secondary | ICD-10-CM | POA: Diagnosis not present

## 2017-12-22 DIAGNOSIS — R6521 Severe sepsis with septic shock: Secondary | ICD-10-CM | POA: Diagnosis not present

## 2017-12-22 DIAGNOSIS — W1830XA Fall on same level, unspecified, initial encounter: Secondary | ICD-10-CM | POA: Diagnosis not present

## 2017-12-22 DIAGNOSIS — Y999 Unspecified external cause status: Secondary | ICD-10-CM | POA: Diagnosis not present

## 2017-12-22 DIAGNOSIS — I340 Nonrheumatic mitral (valve) insufficiency: Secondary | ICD-10-CM | POA: Diagnosis not present

## 2017-12-22 DIAGNOSIS — Y92129 Unspecified place in nursing home as the place of occurrence of the external cause: Secondary | ICD-10-CM | POA: Diagnosis not present

## 2017-12-22 DIAGNOSIS — R51 Headache: Secondary | ICD-10-CM | POA: Diagnosis not present

## 2017-12-22 DIAGNOSIS — A419 Sepsis, unspecified organism: Secondary | ICD-10-CM | POA: Diagnosis not present

## 2017-12-22 DIAGNOSIS — G92 Toxic encephalopathy: Secondary | ICD-10-CM | POA: Diagnosis not present

## 2017-12-22 DIAGNOSIS — N186 End stage renal disease: Secondary | ICD-10-CM | POA: Diagnosis not present

## 2017-12-22 DIAGNOSIS — R1319 Other dysphagia: Secondary | ICD-10-CM | POA: Diagnosis not present

## 2017-12-22 DIAGNOSIS — N400 Enlarged prostate without lower urinary tract symptoms: Secondary | ICD-10-CM | POA: Diagnosis present

## 2017-12-22 DIAGNOSIS — Z79899 Other long term (current) drug therapy: Secondary | ICD-10-CM | POA: Diagnosis not present

## 2017-12-22 DIAGNOSIS — R402441 Other coma, without documented Glasgow coma scale score, or with partial score reported, in the field [EMT or ambulance]: Secondary | ICD-10-CM | POA: Diagnosis not present

## 2017-12-22 DIAGNOSIS — R05 Cough: Secondary | ICD-10-CM | POA: Diagnosis not present

## 2017-12-22 DIAGNOSIS — I10 Essential (primary) hypertension: Secondary | ICD-10-CM | POA: Diagnosis not present

## 2017-12-22 DIAGNOSIS — D72829 Elevated white blood cell count, unspecified: Secondary | ICD-10-CM | POA: Diagnosis not present

## 2017-12-22 DIAGNOSIS — Z950 Presence of cardiac pacemaker: Secondary | ICD-10-CM | POA: Diagnosis not present

## 2017-12-22 DIAGNOSIS — E872 Acidosis: Secondary | ICD-10-CM | POA: Diagnosis not present

## 2017-12-22 DIAGNOSIS — Z992 Dependence on renal dialysis: Secondary | ICD-10-CM | POA: Diagnosis not present

## 2017-12-22 DIAGNOSIS — E1169 Type 2 diabetes mellitus with other specified complication: Secondary | ICD-10-CM | POA: Diagnosis not present

## 2017-12-22 DIAGNOSIS — E1122 Type 2 diabetes mellitus with diabetic chronic kidney disease: Secondary | ICD-10-CM | POA: Diagnosis not present

## 2017-12-22 DIAGNOSIS — Z515 Encounter for palliative care: Secondary | ICD-10-CM | POA: Diagnosis not present

## 2017-12-22 DIAGNOSIS — F319 Bipolar disorder, unspecified: Secondary | ICD-10-CM | POA: Diagnosis present

## 2017-12-22 DIAGNOSIS — S299XXA Unspecified injury of thorax, initial encounter: Secondary | ICD-10-CM | POA: Diagnosis not present

## 2017-12-22 DIAGNOSIS — Z7401 Bed confinement status: Secondary | ICD-10-CM | POA: Diagnosis not present

## 2017-12-22 DIAGNOSIS — I959 Hypotension, unspecified: Secondary | ICD-10-CM | POA: Diagnosis not present

## 2017-12-22 DIAGNOSIS — Z89511 Acquired absence of right leg below knee: Secondary | ICD-10-CM | POA: Diagnosis not present

## 2017-12-22 DIAGNOSIS — S199XXA Unspecified injury of neck, initial encounter: Secondary | ICD-10-CM | POA: Diagnosis not present

## 2017-12-22 DIAGNOSIS — R1310 Dysphagia, unspecified: Secondary | ICD-10-CM | POA: Diagnosis present

## 2017-12-22 DIAGNOSIS — N185 Chronic kidney disease, stage 5: Secondary | ICD-10-CM | POA: Diagnosis not present

## 2017-12-22 DIAGNOSIS — R0689 Other abnormalities of breathing: Secondary | ICD-10-CM | POA: Diagnosis not present

## 2017-12-22 DIAGNOSIS — R109 Unspecified abdominal pain: Secondary | ICD-10-CM | POA: Diagnosis present

## 2017-12-22 DIAGNOSIS — R791 Abnormal coagulation profile: Secondary | ICD-10-CM | POA: Diagnosis present

## 2017-12-22 DIAGNOSIS — R1084 Generalized abdominal pain: Secondary | ICD-10-CM | POA: Diagnosis not present

## 2017-12-22 DIAGNOSIS — Z8673 Personal history of transient ischemic attack (TIA), and cerebral infarction without residual deficits: Secondary | ICD-10-CM | POA: Diagnosis not present

## 2017-12-22 DIAGNOSIS — L89153 Pressure ulcer of sacral region, stage 3: Secondary | ICD-10-CM | POA: Diagnosis not present

## 2017-12-22 DIAGNOSIS — M6281 Muscle weakness (generalized): Secondary | ICD-10-CM | POA: Diagnosis not present

## 2017-12-22 DIAGNOSIS — Z7189 Other specified counseling: Secondary | ICD-10-CM | POA: Diagnosis not present

## 2017-12-22 DIAGNOSIS — Z89512 Acquired absence of left leg below knee: Secondary | ICD-10-CM | POA: Diagnosis not present

## 2017-12-22 DIAGNOSIS — R404 Transient alteration of awareness: Secondary | ICD-10-CM | POA: Diagnosis not present

## 2017-12-22 DIAGNOSIS — R079 Chest pain, unspecified: Secondary | ICD-10-CM | POA: Diagnosis not present

## 2017-12-22 DIAGNOSIS — I480 Paroxysmal atrial fibrillation: Secondary | ICD-10-CM | POA: Diagnosis not present

## 2017-12-22 DIAGNOSIS — K219 Gastro-esophageal reflux disease without esophagitis: Secondary | ICD-10-CM | POA: Diagnosis present

## 2017-12-22 DIAGNOSIS — I5042 Chronic combined systolic (congestive) and diastolic (congestive) heart failure: Secondary | ICD-10-CM | POA: Diagnosis not present

## 2017-12-22 DIAGNOSIS — N2581 Secondary hyperparathyroidism of renal origin: Secondary | ICD-10-CM | POA: Diagnosis not present

## 2017-12-22 DIAGNOSIS — Y939 Activity, unspecified: Secondary | ICD-10-CM | POA: Diagnosis not present

## 2017-12-22 DIAGNOSIS — D631 Anemia in chronic kidney disease: Secondary | ICD-10-CM | POA: Diagnosis not present

## 2017-12-22 DIAGNOSIS — R000 Tachycardia, unspecified: Secondary | ICD-10-CM | POA: Diagnosis not present

## 2017-12-22 DIAGNOSIS — I361 Nonrheumatic tricuspid (valve) insufficiency: Secondary | ICD-10-CM | POA: Diagnosis not present

## 2017-12-22 DIAGNOSIS — S3991XA Unspecified injury of abdomen, initial encounter: Secondary | ICD-10-CM | POA: Diagnosis not present

## 2017-12-22 DIAGNOSIS — R579 Shock, unspecified: Secondary | ICD-10-CM | POA: Diagnosis not present

## 2017-12-22 DIAGNOSIS — I700 Atherosclerosis of aorta: Secondary | ICD-10-CM | POA: Diagnosis not present

## 2017-12-22 DIAGNOSIS — E1151 Type 2 diabetes mellitus with diabetic peripheral angiopathy without gangrene: Secondary | ICD-10-CM | POA: Diagnosis not present

## 2017-12-22 DIAGNOSIS — R4182 Altered mental status, unspecified: Secondary | ICD-10-CM | POA: Diagnosis not present

## 2017-12-22 DIAGNOSIS — G9340 Encephalopathy, unspecified: Secondary | ICD-10-CM | POA: Diagnosis not present

## 2017-12-22 DIAGNOSIS — I132 Hypertensive heart and chronic kidney disease with heart failure and with stage 5 chronic kidney disease, or end stage renal disease: Secondary | ICD-10-CM | POA: Diagnosis not present

## 2017-12-23 ENCOUNTER — Encounter (HOSPITAL_COMMUNITY): Payer: Self-pay

## 2017-12-23 ENCOUNTER — Emergency Department (HOSPITAL_COMMUNITY): Payer: Medicare Other

## 2017-12-23 ENCOUNTER — Emergency Department (HOSPITAL_COMMUNITY)
Admission: EM | Admit: 2017-12-23 | Discharge: 2017-12-23 | Disposition: A | Discharge status: Home / Self Care | Payer: Medicare Other | Attending: Emergency Medicine | Admitting: Emergency Medicine

## 2017-12-23 DIAGNOSIS — I132 Hypertensive heart and chronic kidney disease with heart failure and with stage 5 chronic kidney disease, or end stage renal disease: Secondary | ICD-10-CM | POA: Insufficient documentation

## 2017-12-23 DIAGNOSIS — S3991XA Unspecified injury of abdomen, initial encounter: Secondary | ICD-10-CM | POA: Diagnosis not present

## 2017-12-23 DIAGNOSIS — I5042 Chronic combined systolic (congestive) and diastolic (congestive) heart failure: Secondary | ICD-10-CM | POA: Diagnosis not present

## 2017-12-23 DIAGNOSIS — R079 Chest pain, unspecified: Secondary | ICD-10-CM | POA: Diagnosis not present

## 2017-12-23 DIAGNOSIS — E1122 Type 2 diabetes mellitus with diabetic chronic kidney disease: Secondary | ICD-10-CM | POA: Insufficient documentation

## 2017-12-23 DIAGNOSIS — S299XXA Unspecified injury of thorax, initial encounter: Secondary | ICD-10-CM | POA: Diagnosis not present

## 2017-12-23 DIAGNOSIS — S199XXA Unspecified injury of neck, initial encounter: Secondary | ICD-10-CM | POA: Diagnosis not present

## 2017-12-23 DIAGNOSIS — N186 End stage renal disease: Secondary | ICD-10-CM | POA: Insufficient documentation

## 2017-12-23 DIAGNOSIS — Z79899 Other long term (current) drug therapy: Secondary | ICD-10-CM | POA: Insufficient documentation

## 2017-12-23 DIAGNOSIS — Z992 Dependence on renal dialysis: Secondary | ICD-10-CM | POA: Insufficient documentation

## 2017-12-23 DIAGNOSIS — Y939 Activity, unspecified: Secondary | ICD-10-CM | POA: Insufficient documentation

## 2017-12-23 DIAGNOSIS — Y999 Unspecified external cause status: Secondary | ICD-10-CM | POA: Insufficient documentation

## 2017-12-23 DIAGNOSIS — R51 Headache: Secondary | ICD-10-CM | POA: Diagnosis not present

## 2017-12-23 DIAGNOSIS — K9423 Gastrostomy malfunction: Secondary | ICD-10-CM

## 2017-12-23 DIAGNOSIS — Y92129 Unspecified place in nursing home as the place of occurrence of the external cause: Secondary | ICD-10-CM | POA: Insufficient documentation

## 2017-12-23 DIAGNOSIS — W19XXXA Unspecified fall, initial encounter: Secondary | ICD-10-CM

## 2017-12-23 DIAGNOSIS — S069X9A Unspecified intracranial injury with loss of consciousness of unspecified duration, initial encounter: Secondary | ICD-10-CM | POA: Diagnosis not present

## 2017-12-23 DIAGNOSIS — W1830XA Fall on same level, unspecified, initial encounter: Secondary | ICD-10-CM | POA: Insufficient documentation

## 2017-12-23 DIAGNOSIS — Z8673 Personal history of transient ischemic attack (TIA), and cerebral infarction without residual deficits: Secondary | ICD-10-CM | POA: Insufficient documentation

## 2017-12-23 HISTORY — PX: IR REPLC GASTRO/COLONIC TUBE PERCUT W/FLUORO: IMG2333

## 2017-12-23 LAB — COMPREHENSIVE METABOLIC PANEL
ALBUMIN: 3.3 g/dL — AB (ref 3.5–5.0)
ALK PHOS: 61 U/L (ref 38–126)
ALT: 14 U/L — ABNORMAL LOW (ref 17–63)
AST: 18 U/L (ref 15–41)
Anion gap: 15 (ref 5–15)
BILIRUBIN TOTAL: 0.6 mg/dL (ref 0.3–1.2)
BUN: 42 mg/dL — ABNORMAL HIGH (ref 6–20)
CALCIUM: 9.8 mg/dL (ref 8.9–10.3)
CO2: 32 mmol/L (ref 22–32)
Chloride: 90 mmol/L — ABNORMAL LOW (ref 101–111)
Creatinine, Ser: 6.14 mg/dL — ABNORMAL HIGH (ref 0.61–1.24)
GFR calc Af Amer: 10 mL/min — ABNORMAL LOW (ref >60)
GFR, EST NON AFRICAN AMERICAN: 9 mL/min — AB (ref >60)
GLUCOSE: 189 mg/dL — AB (ref 65–99)
POTASSIUM: 4.1 mmol/L (ref 3.5–5.1)
Sodium: 137 mmol/L (ref 135–145)
TOTAL PROTEIN: 8.9 g/dL — AB (ref 6.5–8.1)

## 2017-12-23 LAB — CBC WITH DIFFERENTIAL/PLATELET
Abs Immature Granulocytes: 0 10*3/uL (ref 0.0–0.1)
Basophils Absolute: 0 10*3/uL (ref 0.0–0.1)
Basophils Relative: 0 %
EOS ABS: 0.2 10*3/uL (ref 0.0–0.7)
EOS PCT: 1 %
HEMATOCRIT: 39.1 % (ref 39.0–52.0)
Hemoglobin: 12 g/dL — ABNORMAL LOW (ref 13.0–17.0)
Immature Granulocytes: 0 %
LYMPHS ABS: 1.3 10*3/uL (ref 0.7–4.0)
Lymphocytes Relative: 12 %
MCH: 28.2 pg (ref 26.0–34.0)
MCHC: 30.7 g/dL (ref 30.0–36.0)
MCV: 92 fL (ref 78.0–100.0)
MONO ABS: 0.9 10*3/uL (ref 0.1–1.0)
Monocytes Relative: 8 %
Neutro Abs: 8.3 10*3/uL — ABNORMAL HIGH (ref 1.7–7.7)
Neutrophils Relative %: 79 %
Platelets: 353 10*3/uL (ref 150–400)
RBC: 4.25 MIL/uL (ref 4.22–5.81)
RDW: 16.4 % — AB (ref 11.5–15.5)
WBC: 10.7 10*3/uL — AB (ref 4.0–10.5)

## 2017-12-23 LAB — I-STAT CG4 LACTIC ACID, ED
Lactic Acid, Venous: 2.18 mmol/L (ref 0.5–1.9)
Lactic Acid, Venous: 1.67 mmol/L (ref 0.5–1.9)

## 2017-12-23 LAB — PROTIME-INR
INR: 2.06
PROTHROMBIN TIME: 23.1 s — AB (ref 11.4–15.2)

## 2017-12-23 LAB — LIPASE, BLOOD: Lipase: 67 U/L — ABNORMAL HIGH (ref 11–51)

## 2017-12-23 MED ORDER — LIDOCAINE VISCOUS HCL 2 % MT SOLN
OROMUCOSAL | Status: AC
  Filled 2017-12-23: qty 15

## 2017-12-23 MED ORDER — IOPAMIDOL (ISOVUE-300) INJECTION 61%
INTRAVENOUS | Status: AC
  Administered 2017-12-23: 15 mL via GASTROSTOMY
  Filled 2017-12-23: qty 50

## 2017-12-23 MED ORDER — SODIUM CHLORIDE 0.9 % IV BOLUS
500.0000 mL | Freq: Once | INTRAVENOUS | Status: AC
  Administered 2017-12-23: 500 mL via INTRAVENOUS

## 2017-12-23 MED ORDER — LIDOCAINE VISCOUS HCL 2 % MT SOLN
OROMUCOSAL | Status: AC | PRN
  Administered 2017-12-23: 15 mL via OROMUCOSAL

## 2017-12-23 NOTE — ED Notes (Signed)
Waiting for PTAR 

## 2017-12-23 NOTE — ED Notes (Signed)
Patient transported to X-ray and Pt to go to CT immediately after

## 2017-12-23 NOTE — Discharge Instructions (Addendum)
Your feeding tube was replaced by interventional radiology today.  Please stay hydrated and follow-up with your primary doctor for further management.  We did not find any evidence of traumatic injuries today on your imaging.

## 2017-12-23 NOTE — ED Notes (Signed)
Pt bak from CT/Xray

## 2017-12-23 NOTE — ED Notes (Signed)
Report given to [facility]

## 2017-12-23 NOTE — ED Notes (Signed)
Discharge instructions discussed with Pt and family. Family verbalized understanding. Pt leaving via PTAR.

## 2017-12-23 NOTE — ED Provider Notes (Signed)
[facility] EMERGENCY DEPARTMENT Provider Note   CSN: [id number] Arrival date & time: 12/23/17  0720     History   Chief Complaint Chief Complaint  Patient presents with  . Fall    HPI TERIN CRAGLE is a 67 y.o. male.  The history is provided by the patient and medical records. The history is limited by the condition of the patient. No language interpreter was used.  Fall  This is a new problem. The current episode started 6 to 12 hours ago. The problem occurs rarely. The problem has not changed since onset.Associated symptoms include abdominal pain and headaches. Pertinent negatives include no chest pain and no shortness of breath. Nothing aggravates the symptoms. Nothing relieves the symptoms. He has tried nothing for the symptoms. The treatment provided no relief.     Past Medical History:  Diagnosis Date  . Anemia   . Antral ulcer 2014   small  . BENIGN PROSTATIC HYPERTROPHY   . Bipolar disorder ([place])    "sometimes" (10/07/2016)  . CHOLELITHIASIS   . Chronic combined systolic and diastolic CHF (congestive heart failure) ([place])   . Complication of anesthesia    wife states pt had trouble waking up with in Nov., 2014  . CVA (cerebral vascular accident) ([place]) 07/2007   No residual effect  . DEPRESSION   . DIABETES MELLITUS, TYPE II    diet control.  No medication  since November 2015  . ERECTILE DYSFUNCTION   . ESRD on hemodialysis ([place])    ESRD due to DM/HTN. .  HD TTS at [facility] on [address].  [name] GERD   . GI bleed    due to gastritis, discharged 10/02/16/notes 10/07/2016  . Hepatitis C    C - has been treated  . History of Clostridium difficile   . History of kidney stones   . HYPERTENSION   . LBBB (left bundle branch block)   . Morbid obesity ([place])   . PAF (paroxysmal atrial fibrillation) ([place])    a. Dx 12/2015.    Patient Active Problem List   Diagnosis Date Noted  . Pressure injury of skin 11/27/2017  . Toxic metabolic  encephalopathy 96/75/9163  . Pulmonary edema   . Septic shock ([place]) 11/22/2017  . Severe sepsis ([place]) 11/21/2017  . Chronic pain 09/07/2017  . CHB (complete heart block) ([place]) 04/29/2017  . Heart block AV third degree ([place]) 04/29/2017  . PAD (peripheral artery disease) ([place])   . Gait disorder 02/10/2017  . Anemia due to stage 5 chronic kidney disease ([place])   . Benign neoplasm of descending colon   . Duodenal ulcer without hemorrhage or perforation   . Paroxysmal atrial fibrillation ([place]) 09/15/2016  . Glaucoma 12/27/2015  . LBBB (left bundle branch block)   . Liver fibrosis 10/07/2015  . Cigarette nicotine dependence without complication 84/66/5993  . Obesity (BMI 30-39.9) 06/06/2015  . Organ transplant candidate 05/17/2015  . DM (diabetes mellitus) with peripheral vascular complication ([place]) 57/07/7791  . Insulin-dependent diabetes mellitus with proliferative retinopathy ([place]) 05/17/2015  . History of alcoholism ([place]) 05/10/2015  . Ulcer of sacral region, stage 3 ([place]) 09/26/2013  . S/P BKA (below knee amputation) bilateral ([place]) 09/08/2013  . ESRD on hemodialysis ([place]) 05/09/2013  . Cerebrovascular accident ([place]) 02/20/2013  . Acute on chronic combined systolic and diastolic CHF (congestive heart failure) ([place]) 10/28/2012  . GI bleed 10/27/2012  . Mass in rectum 10/27/2012  . Type 2 diabetes mellitus with chronic kidney disease  on chronic dialysis, with long-term current use of insulin ([place]) 09/08/2012  . Essential (primary) hypertension 09/08/2012  . LVH (left ventricular hypertrophy)-severe concentric 06/13/2012  . Hyperlipidemia, unspecified 01/30/2011  . CHOLELITHIASIS 08/01/2010  . BENIGN PROSTATIC HYPERTROPHY 08/01/2010  . History of cardiovascular disorder 08/06/2009  . Depression 03/18/2009  . NEPHROLITHIASIS, HX OF 03/18/2009  . Major depressive disorder, single episode, unspecified 03/18/2009  . Morbid obesity ([place]) 03/25/2007  . Hypertensive heart disease with CHF  (congestive heart failure) ([place]) 03/25/2007  . GERD (gastroesophageal reflux disease) 03/25/2007  . Chronic hepatitis C without hepatic coma ([place]) 03/25/2007    Past Surgical History:  Procedure Laterality Date  . AMPUTATION Left 05/12/2013   Procedure: AMPUTATION RAY;  Surgeon: Newt Minion, MD;  Location: [facility];  Service: Orthopedics;  Laterality: Left;  Left Foot 1st Ray Amputation  . AMPUTATION Left 06/09/2013   Procedure: AMPUTATION BELOW KNEE;  Surgeon: Newt Minion, MD;  Location: [facility];  Service: Orthopedics;  Laterality: Left;  Left Below Knee Amputation and removal proximal screws IM tibial nail  . AMPUTATION Right 09/08/2013   Procedure: AMPUTATION BELOW KNEE;  Surgeon: Newt Minion, MD;  Location: [facility];  Service: Orthopedics;  Laterality: Right;  Right Below Knee Amputation  . AMPUTATION Right 10/11/2013   Procedure: AMPUTATION BELOW KNEE;  Surgeon: Newt Minion, MD;  Location: [facility];  Service: Orthopedics;  Laterality: Right;  Right Below Knee Amputation Revision  . AV FISTULA PLACEMENT  06/14/2012   Procedure: ARTERIOVENOUS (AV) FISTULA CREATION;  Surgeon: Angelia Mould, MD;  Location: [facility] OR;  Service: Vascular;  Laterality: Left;  Left basilic vein transposition with fistula.  . AV FISTULA PLACEMENT Left 07/21/2017   Procedure: Attempt INSERTION OF ARTERIOVENOUS (AV) WITH  ARTEGRAFT Left  UPPER ARM;  Surgeon: Conrad Williamsburg, MD;  Location: [facility];  Service: Vascular;  Laterality: Left;  . BASCILIC VEIN TRANSPOSITION Left 02/17/2017   Procedure: LEFT 1ST STAGE BRACHIAL VEIN TRANSPOSITION;  Surgeon: Conrad El Rancho, MD;  Location: [place];  Service: Vascular;  Laterality: Left;  . BASCILIC VEIN TRANSPOSITION Left 04/21/2017   Procedure: LEFT 2ND STAGE BRACHIAL VEIN TRANSPOSITION;  Surgeon: Conrad [place]uff, MD;  Location: [facility];  Service: Vascular;  Laterality: Left;  . COLONOSCOPY N/A 10/28/2012   Procedure: COLONOSCOPY;  Surgeon: Jeryl Columbia, MD;  Location: [facility] ENDOSCOPY;   Service: Endoscopy;  Laterality: N/A;  . COLONOSCOPY N/A 11/02/2012   Procedure: COLONOSCOPY;  Surgeon: Cleotis Nipper, MD;  Location[facility] ENDOSCOPY;  Service: Endoscopy;  Laterality: N/A;  . COLONOSCOPY N/A 11/03/2012   Procedure: COLONOSCOPY;  Surgeon: Cleotis Nipper, MD;  Location: [facility] ENDOSCOPY;  Service: Endoscopy;  Laterality: N/A;  . COLONOSCOPY N/A 09/16/2016   Procedure: COLONOSCOPY;  Surgeon: Jerene Bears, MD;  Location: [facility] ENDOSCOPY;  Service: Gastroenterology;  Laterality: N/A;  . ENTEROSCOPY N/A 11/08/2012   Procedure: ENTEROSCOPY;  Surgeon: Wonda Horner, MD;  Location: [facility] ENDOSCOPY;  Service: Endoscopy;  Laterality: N/A;  . ESOPHAGOGASTRODUODENOSCOPY N/A 11/02/2012   Procedure: ESOPHAGOGASTRODUODENOSCOPY (EGD);  Surgeon: Cleotis Nipper, MD;  Location: [facility] ENDOSCOPY;  Service: Endoscopy;  Laterality: N/A;  . ESOPHAGOGASTRODUODENOSCOPY (EGD) WITH PROPOFOL N/A 09/16/2016   Procedure: ESOPHAGOGASTRODUODENOSCOPY (EGD) WITH PROPOFOL;  Surgeon: Jerene Bears, MD;  Location: [facility] ENDOSCOPY;  Service: Gastroenterology;  Laterality: N/A;  . EXCHANGE OF A DIALYSIS CATHETER Left 11/13/2016   Procedure: EXCHANGE OF A DIALYSIS CATHETER;  Surgeon: Elam Dutch, MD;  Location: [facility];  Service: Vascular;  Laterality: Left;  . EYE SURGERY Left    to remove scar tissue  . GIVENS CAPSULE STUDY N/A 11/04/2012   Procedure: GIVENS CAPSULE STUDY;  Surgeon: Cleotis Nipper, MD;  Location: [facility] ENDOSCOPY;  Service: Endoscopy;  Laterality: N/A;  . GIVENS CAPSULE STUDY N/A 09/29/2016   Procedure: GIVENS CAPSULE STUDY;  Surgeon: Manus Gunning, MD;  Location: [facility];  Service: Gastroenterology;  Laterality: N/A;  try to keep pt up in bedside chair as much as possible during the study.    [name] HARDWARE REMOVAL Left 06/09/2013   Procedure: HARDWARE REMOVAL;  Surgeon: Newt Minion, MD;  Location: [facility];  Service: Orthopedics;  Laterality: Left;  Left Below Knee Amputation  and Removal proximal screws IM  tibial nail  . HEMATOMA EVACUATION Left 04/29/2017   Procedure: EVACUATION HEMATOMA LEFT ARM;  Surgeon: Conrad Malvern, MD;  Location: [place] [facility];  Service: Vascular;  Laterality: Left;  . INSERTION OF DIALYSIS CATHETER N/A 11/09/2016   Procedure: INSERTION OF TEMPORARY DIALYSIS CATHETER;  Surgeon: Elam Dutch, MD;  Location: [facility];  Service: Vascular;  Laterality: N/A;  . IR GENERIC HISTORICAL  09/27/2016   IR US GUIDE VASC ACCESS RIGHT 09/27/2016 Aletta Edouard, MD [facility]-INTERV RAD  . IR GENERIC HISTORICAL  09/27/2016   IR FLUORO GUIDE CV LINE RIGHT 09/27/2016 Aletta Edouard, MD [facility]INTER[facility]  . LIGATION OF ARTERIOVENOUS  FISTULA Left 11/09/2016   Procedure: LIGATION OF ARTERIOVENOUS  FISTULA;  Surgeon: Elam Dutch, MD;  Location: [facility];  Service: Vascular;  Laterality: Left;  . ORIF FIBULA FRACTURE Left 09/09/2012   Procedure: OPEN REDUCTION INTERNAL FIXATION (ORIF) FIBULA FRACTURE;  Surgeon: Johnny Bridge, MD;  Location: [facility];  Service: Orthopedics;  Laterality: Left;  . PACEMAKER IMPLANT N/A 04/30/2017   Procedure: PACEMAKER IMPLANT;  Surgeon: Evans Lance, MD;  Location: [facility] CV LAB;  Service: Cardiovascular;  Laterality: N/A;  . TEMPORARY PACEMAKER N/A 04/29/2017   Procedure: TEMPORARY PACEMAKER;  Surgeon: Lorretta Harp, MD;  Location: [place] CV LAB;  Service: Cardiovascular;  Laterality: N/A;  . TIBIA IM NAIL INSERTION Left 09/09/2012   Procedure: INTRAMEDULLARY (IM) NAIL TIBIAL;  Surgeon: Johnny Bridge, MD;  Location: [place];  Service: Orthopedics;  Laterality: Left;  left tibial nail and open reduction internal fixation left fibula fracture  . UPPER EXTREMITY VENOGRAPHY N/A 12/14/2016   Procedure: Bilateral Upper Extremity Venography and Central Venography;  Surgeon: Conrad [place], MD;  Location: [place] CV LAB;  Service: Cardiovascular;  Laterality: N/A;        Home Medications    Prior to Admission medications   Medication Sig Start Date End Date Taking?  Authorizing Provider  acetaminophen (TYLENOL) 160 MG/5ML solution Place 20.3 mLs (650 mg total) into feeding tube every 6 (six) hours as needed for headache or fever. 12/02/17   Lavina Hamman, MD  Amino Acids-Protein Hydrolys (FEEDING SUPPLEMENT, PRO-STAT SUGAR FREE 64,) LIQD Place 30 mLs into feeding tube 2 (two) times daily. 12/02/17   Lavina Hamman, MD  amiodarone (PACERONE) 200 MG tablet Place 1 tablet (200 mg total) into feeding tube daily. 12/03/17   Lavina Hamman, MD  atorvastatin (LIPITOR) 40 MG tablet Place 1 tablet (40 mg total) into feeding tube at bedtime. 12/02/17   Lavina Hamman, MD  calcium acetate (PHOSLO) 667 MG capsule Take 1,334 mg by mouth 2 (two) times daily.     [provider]  famotidine (PEPCID) 40 MG/5ML suspension  Place 2.5 mLs (20 mg total) into feeding tube daily. 12/03/17   Lavina Hamman, MD  heparin 100-0.45 UNIT/ML-% infusion Inject 1,000 Units/hr into the vein continuous. 12/02/17   Lavina Hamman, MD  latanoprost (XALATAN) 0.005 % ophthalmic solution Place 1 drop into both eyes at bedtime.  06/12/16   [provider]  midodrine (PROAMATINE) 5 MG tablet Place 1 tablet (5 mg total) into feeding tube 2 (two) times daily with a meal. 12/02/17   Lavina Hamman, MD  Nutritional Supplements (FEEDING SUPPLEMENT, NEPRO CARB STEADY,) LIQD Place 1,000 mLs into feeding tube daily. 12/02/17   Lavina Hamman, MD  QUEtiapine (SEROQUEL) 25 MG tablet Take 0.5 tablets (12.5 mg total) by mouth at bedtime. 05/09/17   Nita Sells, MD  silver sulfADIAZINE (SSD) 1 % cream APPLY EXTERNALLY TO THE AFFECTED AREA DAILY Patient taking differently: Apply 1 application topically daily as needed (skin care).  07/30/17   Suzan Slick, NP  sorbitol 70 % SOLN Take 30 mLs by mouth 2 (two) times daily. Patient taking differently: Take 30 mLs by mouth 2 (two) times daily as needed for moderate constipation.  05/09/17   Nita Sells, MD    Family History Family  History  Problem Relation Age of Onset  . Diabetes Mother   . Hypertension Mother   . Heart attack Father   . Hypertension Father   . Coronary artery disease Other     Social History Social History   Tobacco Use  . Smoking status: Never Smoker  . Smokeless tobacco: Never Used  Substance Use Topics  . Alcohol use: No  . Drug use: No     Allergies   Ceftriaxone sodium in dextrose; Ciprofloxacin; Nsaids; Penicillins; and Sulfa antibiotics   Review of Systems Review of Systems  Unable to perform ROS: Mental status change  Constitutional: Negative for chills, fatigue and fever.  HENT: Negative for congestion.   Eyes: Negative for visual disturbance.  Respiratory: Negative for cough, chest tightness, shortness of breath, wheezing and stridor.   Cardiovascular: Negative for chest pain, palpitations and leg swelling.  Gastrointestinal: Positive for abdominal pain. Negative for abdominal distention, constipation, diarrhea, nausea and vomiting.  Genitourinary: Negative for flank pain and frequency.  Musculoskeletal: Negative for back pain, neck pain and neck stiffness.  Skin: Positive for wound (g tube site). Negative for rash.  Neurological: Positive for headaches. Negative for tremors, speech difficulty, weakness and numbness.  Psychiatric/Behavioral: Negative for agitation.  All other systems reviewed and are negative.    Physical Exam Updated Vital Signs BP (!) 110/57 (BP Location: Right Arm)   Pulse 87   Temp 98.2 F (36.8 C) (Oral)   Resp 12   SpO2 97%   Physical Exam  Constitutional: He appears well-developed and well-nourished. No distress.  HENT:  Head: Normocephalic.  Nose: Nose normal.  Mouth/Throat: Oropharynx is clear and moist. No oropharyngeal exudate.  Eyes: Pupils are equal, round, and reactive to light. Conjunctivae and EOM are normal.  Neck: Neck supple.  In C collar wrap   Cardiovascular: Normal rate and intact distal pulses.  Pulmonary/Chest:  Effort normal. No respiratory distress. He has no wheezes. He exhibits no tenderness.  Abdominal: Soft. He exhibits no distension. There is tenderness.  Musculoskeletal: He exhibits no tenderness.  Lymphadenopathy:    He has no cervical adenopathy.  Neurological: He is alert. No sensory deficit. He exhibits normal muscle tone.  Skin: Capillary refill takes less than 2 seconds. He is not diaphoretic. No  erythema. No pallor.  Nursing note and vitals reviewed.    ED Treatments / Results  Labs (all labs ordered are listed, but only abnormal results are displayed) Labs Reviewed  CBC WITH DIFFERENTIAL/PLATELET - Abnormal; Notable for the following components:      Result Value   WBC 10.7 (*)    Hemoglobin 12.0 (*)    RDW 16.4 (*)    Neutro Abs 8.3 (*)    All other components within normal limits  COMPREHENSIVE METABOLIC PANEL - Abnormal; Notable for the following components:   Chloride 90 (*)    Glucose, Bld 189 (*)    BUN 42 (*)    Creatinine, Ser 6.14 (*)    Total Protein 8.9 (*)    Albumin 3.3 (*)    ALT 14 (*)    GFR calc non Af Amer 9 (*)    GFR calc Af Amer 10 (*)    All other components within normal limits  LIPASE, BLOOD - Abnormal; Notable for the following components:   Lipase 67 (*)    All other components within normal limits  PROTIME-INR - Abnormal; Notable for the following components:   Prothrombin Time 23.1 (*)    All other components within normal limits  I-STAT CG4 LACTIC ACID, ED - Abnormal; Notable for the following components:   Lactic Acid, Venous 2.18 (*)    All other components within normal limits  I-STAT CG4 LACTIC ACID, ED    EKG None  Radiology Ct Abdomen Pelvis Wo Contrast  Result Date: 12/23/2017 CLINICAL DATA:  Unwitnessed fall.  Question she tube pulled out. EXAM: CT ABDOMEN AND PELVIS WITHOUT CONTRAST TECHNIQUE: Multidetector CT imaging of the abdomen and pelvis was performed following the standard protocol without IV contrast. COMPARISON:   Plain films 11/29/2017.  CT 05/06/2017. FINDINGS: Lower chest: Pacer wires noted in the right heart. Aortic calcifications. No aneurysm. Mild cardiomegaly. Dependent atelectasis or scarring. No effusions. Hepatobiliary: No focal hepatic abnormality. Questionable layering gallstones within the gallbladder. Pancreas: No focal abnormality or ductal dilatation. Spleen: No focal abnormality.  Normal size. Adrenals/Urinary Tract: Mildly atrophic kidneys bilaterally. No hydronephrosis. Urinary bladder is decompressed, grossly unremarkable. Adrenal glands unremarkable. Stomach/Bowel: No gastrostomy tube is seen within the stomach or in the abdominal wall. A tract is noted from the anterior abdominal wall, but no visible gastrostomy tube. Appendix is normal. No evidence of bowel obstruction. Large stool burden in the colon, particularly rectosigmoid colon, question fecal impaction. Vascular/Lymphatic: Diffuse aortic and iliac/branch vessel calcification. No evidence of aneurysm or adenopathy. Reproductive: No visible focal abnormality. Other: No free fluid or free air. Musculoskeletal: No acute bony abnormality. IMPRESSION: No visible gastrostomy tube within the stomach or anterior abdominal wall. Suspect layering gallstones. Mildly atrophic kidneys.  No hydronephrosis. Diffuse aortoiliac atherosclerosis. Large volume stool in the rectosigmoid colon, question fecal impaction. Electronically Signed   By: Rolm Baptise M.D.   On: 12/23/2017 08:54   Dg Chest 1 View  Result Date: 12/23/2017 CLINICAL DATA:  Pain following fall EXAM: CHEST  1 VIEW COMPARISON:  November 22, 2017 FINDINGS: Central catheter tip is in the right atrium. No pneumothorax. There is no edema or consolidation. Heart is mildly enlarged. Pacemaker leads are attached to the right atrium and right ventricle. No adenopathy. No bone lesions. IMPRESSION: Central catheter tip in right atrium without pneumothorax. No edema or consolidation. Stable cardiac  silhouette. Stable pacemaker lead positioning. Electronically Signed   By: Lowella Grip III M.D.   On: 12/23/2017 08:44  Ct Head Wo Contrast  Result Date: 12/23/2017 CLINICAL DATA:  Unwitnessed fall.  Altered level of consciousness EXAM: CT HEAD WITHOUT CONTRAST CT CERVICAL SPINE WITHOUT CONTRAST TECHNIQUE: Multidetector CT imaging of the head and cervical spine was performed following the standard protocol without intravenous contrast. Multiplanar CT image reconstructions of the cervical spine were also generated. COMPARISON:  MRI 11/26/2017 FINDINGS: CT HEAD FINDINGS Brain: There is atrophy and chronic small vessel disease changes. No acute intracranial abnormality. Specifically, no hemorrhage, hydrocephalus, mass lesion, acute infarction, or significant intracranial injury. Vascular: No hyperdense vessel or unexpected calcification. Skull: No acute calvarial abnormality. Sinuses/Orbits: Visualized paranasal sinuses and mastoids clear. Orbital soft tissues unremarkable. Other: None CT CERVICAL SPINE FINDINGS Alignment: No subluxation Skull base and vertebrae: No fracture. Scattered mottled lucent areas throughout the vertebral bodies, most pronounced adjacent to the disc spaces, favor discogenic changes. Soft tissues and spinal canal: No prevertebral fluid or swelling. No visible canal hematoma. Disc levels: Diffuse degenerative disc changes with disc space narrowing and spurring. Upper chest: No acute findings Other: No acute findings.  Carotid artery calcifications. IMPRESSION: No acute intracranial abnormality. Atrophy, chronic microvascular disease. Diffuse degenerative disc disease throughout the cervical spine. No fracture or subluxation. Electronically Signed   By: Rolm Baptise M.D.   On: 12/23/2017 08:59   Ct Cervical Spine Wo Contrast  Result Date: 12/23/2017 CLINICAL DATA:  Unwitnessed fall.  Altered level of consciousness EXAM: CT HEAD WITHOUT CONTRAST CT CERVICAL SPINE WITHOUT CONTRAST  TECHNIQUE: Multidetector CT imaging of the head and cervical spine was performed following the standard protocol without intravenous contrast. Multiplanar CT image reconstructions of the cervical spine were also generated. COMPARISON:  MRI 11/26/2017 FINDINGS: CT HEAD FINDINGS Brain: There is atrophy and chronic small vessel disease changes. No acute intracranial abnormality. Specifically, no hemorrhage, hydrocephalus, mass lesion, acute infarction, or significant intracranial injury. Vascular: No hyperdense vessel or unexpected calcification. Skull: No acute calvarial abnormality. Sinuses/Orbits: Visualized paranasal sinuses and mastoids clear. Orbital soft tissues unremarkable. Other: None CT CERVICAL SPINE FINDINGS Alignment: No subluxation Skull base and vertebrae: No fracture. Scattered mottled lucent areas throughout the vertebral bodies, most pronounced adjacent to the disc spaces, favor discogenic changes. Soft tissues and spinal canal: No prevertebral fluid or swelling. No visible canal hematoma. Disc levels: Diffuse degenerative disc changes with disc space narrowing and spurring. Upper chest: No acute findings Other: No acute findings.  Carotid artery calcifications. IMPRESSION: No acute intracranial abnormality. Atrophy, chronic microvascular disease. Diffuse degenerative disc disease throughout the cervical spine. No fracture or subluxation. Electronically Signed   By: Rolm Baptise M.D.   On: 12/23/2017 08:59    Procedures Procedures (including critical care time)  Medications Ordered in ED Medications  sodium chloride 0.9 % bolus 500 mL (0 mLs Intravenous Stopped 12/23/17 1625)  iopamidol (ISOVUE-300) 61 % injection (15 mLs Gastrostomy Tube Contrast Given 12/23/17 1713)  lidocaine (XYLOCAINE) 2 % viscous mouth solution (15 mLs Mouth/Throat Given 12/23/17 1719)     Initial Impression / Assessment and Plan / ED Course  I have reviewed the triage vital signs and the nursing notes.  Pertinent  labs & imaging results that were available during my care of the patient were reviewed by me and considered in my medical decision making (see chart for details).     CEDERICK BROADNAX is a 67 y.o. male with a past medical history significant for ESRD on dialysis T/T/S (due for dialysis today), bipolar disorder, hepatitis C, prior GI bleed, kidney stones, hypertension, prior stroke,  atrial for ablation on Coumadin therapy, and complete heart block status post pacemaker who presents with unwitnessed fall and G-tube problem.  According to EMS report to nursing from facility, patient had an unwitnessed fall last night.  Patient was complaining of headache and neck pain as well as some abdominal pain.  He describes the pain as moderate but cannot describe the circumstances of the fall.  Also of note, EMS reports that the G-tube was pulled out and some part of the hardware was still in the patient.  They said the "balloon or bulb" was still inside.  They did not bring the G-tube with them to inspect.  Patient denies nausea or vomiting but does report abdominal pain.  No flank pain or back pain described.  On exam, abdomen is tender to palpation.  There is some small purulence at the G-tube site.  No G-tube is present.  Lungs are overall clear with no significant wheezing.  Chest is nontender.  Neck was nontender however patient reported moderate headache and neck pain just before transport.  He denied vision changes nausea but will follow vomiting.  He was able to move arms with symmetric grip strength.  Patient has bilateral leg amputations but had normal strength and sensation in them.  Back was nontender no flank tenderness present.    Patient was CT images of the head and neck as well as chest x-ray.  Patient will have CT of the abdomen without contrast to look for hardware or other significant abnormality.   Anticipate reassessment after workup.   1:47 PM CT scan showed no evidence of acute fracture or  dislocations.  CT head and neck showed no fractures.  Chest x-ray shows no pneumonia.  CT of the abdomen and pelvis showed no evidence of G-tube parts or hardware.  Gallstones present.  Large stool present.  Patient reports he is having normal bowel movements.  Laboratory testing showed mild elevation of lipase.  Mild leukocytosis present, mild anemia present.  Lactic acid 2.18.  Patient will be given some small fluids.  Creatinine is elevated and he will likely need dialysis however his potassium is not significant elevated and with his reassuring vital signs do not think he needs emergent dialysis right now.  After speaking with his family, it was determined that patient had a PEG tube placed on the 14th, proximal he 2 weeks ago, and it was a 52 [place] PEG tube.  As this is very recent, IR was called and will replace the tube.  If tube is replaced successfully and patient has improvement in symptoms, given the reassuring imaging, suspect patient will be stable for discharge back to his facility.    Final Clinical Impressions(s) / ED Diagnoses   Final diagnoses:  Gastrostomy tube dysfunction [facility])    ED Discharge Orders    None      Clinical Impression: 1. Fall   2. Gastrostomy tube dysfunction (HCC)     Disposition: Discharge  Condition: Good  I have discussed the results, Dx and Tx plan with the pt(& family if present). He/she/they expressed understanding and agree(s) with the plan. Discharge instructions discussed at great length. Strict return precautions discussed and pt &/or family have verbalized understanding of the instructions. No further questions at time of discharge.    Discharge Medication List as of 12/23/2017  7:12 PM      Follow Up: [name], MD [place] [id number] [phone]     [facility]  [place] [address] [id number] mc [facility] [place] [facility]        Senora Lacson, Gwenyth Allegra, MD 12/24/17 [phone]

## 2017-12-23 NOTE — ED Triage Notes (Signed)
Pt arrived via [facility]; per transport, pt from [facility] with c/o fall last night and dislodgement of G tube with "balloon not intact." Pt c/o some neck pain; pt has dailysis on Tues/Th/Sat; pt has stg III to L thigh; Hx of Encephalopathy, DM, Afib; CBG 224, 143/88, 83, 18

## 2017-12-23 NOTE — ED Notes (Signed)
[facility] called @ 1856-per RN-called by Levada Dy

## 2017-12-23 NOTE — Procedures (Signed)
Interventional Radiology Procedure Note  Procedure: Successful replacement with a new 63F G-tube.    Complications: None  Estimated Blood Loss: None  Recommendations: Tube ready for use.   Signed,  Criselda Peaches, MD

## 2017-12-25 DIAGNOSIS — N2581 Secondary hyperparathyroidism of renal origin: Secondary | ICD-10-CM | POA: Diagnosis not present

## 2017-12-25 DIAGNOSIS — E1122 Type 2 diabetes mellitus with diabetic chronic kidney disease: Secondary | ICD-10-CM | POA: Diagnosis not present

## 2017-12-25 DIAGNOSIS — Z992 Dependence on renal dialysis: Secondary | ICD-10-CM | POA: Diagnosis not present

## 2017-12-25 DIAGNOSIS — N186 End stage renal disease: Secondary | ICD-10-CM | POA: Diagnosis not present

## 2017-12-28 ENCOUNTER — Telehealth: Payer: Self-pay | Admitting: Cardiology

## 2017-12-28 ENCOUNTER — Encounter: Payer: Medicare Other | Admitting: *Deleted

## 2017-12-28 DIAGNOSIS — Z992 Dependence on renal dialysis: Secondary | ICD-10-CM | POA: Diagnosis not present

## 2017-12-28 DIAGNOSIS — E1122 Type 2 diabetes mellitus with diabetic chronic kidney disease: Secondary | ICD-10-CM | POA: Diagnosis not present

## 2017-12-28 DIAGNOSIS — N2581 Secondary hyperparathyroidism of renal origin: Secondary | ICD-10-CM | POA: Diagnosis not present

## 2017-12-28 DIAGNOSIS — N186 End stage renal disease: Secondary | ICD-10-CM | POA: Diagnosis not present

## 2017-12-28 NOTE — Telephone Encounter (Signed)
LMOVM reminding pt to send remote transmission.   

## 2017-12-30 ENCOUNTER — Encounter: Payer: Self-pay | Admitting: Cardiology

## 2017-12-30 ENCOUNTER — Other Ambulatory Visit: Payer: Self-pay

## 2017-12-30 ENCOUNTER — Inpatient Hospital Stay (HOSPITAL_COMMUNITY)
Admission: EM | Admit: 2017-12-30 | Discharge: 2018-01-11 | DRG: 871 | Disposition: A | Discharge status: Other Acute Inpatient Hospital | Payer: Medicare Other | Source: Skilled Nursing Facility | Attending: Internal Medicine | Admitting: Internal Medicine

## 2017-12-30 ENCOUNTER — Emergency Department (HOSPITAL_COMMUNITY): Payer: Medicare Other

## 2017-12-30 ENCOUNTER — Encounter (HOSPITAL_COMMUNITY): Payer: Self-pay | Admitting: Emergency Medicine

## 2017-12-30 ENCOUNTER — Inpatient Hospital Stay (HOSPITAL_COMMUNITY): Payer: Medicare Other

## 2017-12-30 ENCOUNTER — Other Ambulatory Visit (HOSPITAL_COMMUNITY): Payer: Self-pay

## 2017-12-30 DIAGNOSIS — I5043 Acute on chronic combined systolic (congestive) and diastolic (congestive) heart failure: Secondary | ICD-10-CM | POA: Diagnosis not present

## 2017-12-30 DIAGNOSIS — E669 Obesity, unspecified: Secondary | ICD-10-CM | POA: Diagnosis present

## 2017-12-30 DIAGNOSIS — I10 Essential (primary) hypertension: Secondary | ICD-10-CM | POA: Diagnosis present

## 2017-12-30 DIAGNOSIS — R0902 Hypoxemia: Secondary | ICD-10-CM | POA: Diagnosis not present

## 2017-12-30 DIAGNOSIS — R6521 Severe sepsis with septic shock: Secondary | ICD-10-CM | POA: Diagnosis not present

## 2017-12-30 DIAGNOSIS — R402441 Other coma, without documented Glasgow coma scale score, or with partial score reported, in the field [EMT or ambulance]: Secondary | ICD-10-CM | POA: Diagnosis not present

## 2017-12-30 DIAGNOSIS — Z992 Dependence on renal dialysis: Secondary | ICD-10-CM | POA: Diagnosis not present

## 2017-12-30 DIAGNOSIS — N2581 Secondary hyperparathyroidism of renal origin: Secondary | ICD-10-CM | POA: Diagnosis not present

## 2017-12-30 DIAGNOSIS — G9349 Other encephalopathy: Secondary | ICD-10-CM | POA: Diagnosis not present

## 2017-12-30 DIAGNOSIS — E872 Acidosis: Secondary | ICD-10-CM | POA: Diagnosis not present

## 2017-12-30 DIAGNOSIS — N185 Chronic kidney disease, stage 5: Secondary | ICD-10-CM

## 2017-12-30 DIAGNOSIS — D631 Anemia in chronic kidney disease: Secondary | ICD-10-CM | POA: Diagnosis present

## 2017-12-30 DIAGNOSIS — Z950 Presence of cardiac pacemaker: Secondary | ICD-10-CM

## 2017-12-30 DIAGNOSIS — Z89512 Acquired absence of left leg below knee: Secondary | ICD-10-CM

## 2017-12-30 DIAGNOSIS — M79605 Pain in left leg: Secondary | ICD-10-CM

## 2017-12-30 DIAGNOSIS — I480 Paroxysmal atrial fibrillation: Secondary | ICD-10-CM | POA: Diagnosis not present

## 2017-12-30 DIAGNOSIS — Z419 Encounter for procedure for purposes other than remedying health state, unspecified: Secondary | ICD-10-CM

## 2017-12-30 DIAGNOSIS — Z8249 Family history of ischemic heart disease and other diseases of the circulatory system: Secondary | ICD-10-CM | POA: Diagnosis not present

## 2017-12-30 DIAGNOSIS — I5042 Chronic combined systolic (congestive) and diastolic (congestive) heart failure: Secondary | ICD-10-CM | POA: Diagnosis not present

## 2017-12-30 DIAGNOSIS — E118 Type 2 diabetes mellitus with unspecified complications: Secondary | ICD-10-CM | POA: Diagnosis not present

## 2017-12-30 DIAGNOSIS — L89152 Pressure ulcer of sacral region, stage 2: Secondary | ICD-10-CM | POA: Diagnosis present

## 2017-12-30 DIAGNOSIS — S88912A Complete traumatic amputation of left lower leg, level unspecified, initial encounter: Secondary | ICD-10-CM | POA: Diagnosis not present

## 2017-12-30 DIAGNOSIS — I132 Hypertensive heart and chronic kidney disease with heart failure and with stage 5 chronic kidney disease, or end stage renal disease: Secondary | ICD-10-CM | POA: Diagnosis present

## 2017-12-30 DIAGNOSIS — Z833 Family history of diabetes mellitus: Secondary | ICD-10-CM

## 2017-12-30 DIAGNOSIS — R1310 Dysphagia, unspecified: Secondary | ICD-10-CM | POA: Diagnosis present

## 2017-12-30 DIAGNOSIS — K529 Noninfective gastroenteritis and colitis, unspecified: Secondary | ICD-10-CM | POA: Diagnosis present

## 2017-12-30 DIAGNOSIS — N186 End stage renal disease: Secondary | ICD-10-CM | POA: Diagnosis not present

## 2017-12-30 DIAGNOSIS — G92 Toxic encephalopathy: Secondary | ICD-10-CM | POA: Diagnosis not present

## 2017-12-30 DIAGNOSIS — R791 Abnormal coagulation profile: Secondary | ICD-10-CM | POA: Diagnosis present

## 2017-12-30 DIAGNOSIS — Z452 Encounter for adjustment and management of vascular access device: Secondary | ICD-10-CM | POA: Diagnosis not present

## 2017-12-30 DIAGNOSIS — I340 Nonrheumatic mitral (valve) insufficiency: Secondary | ICD-10-CM | POA: Diagnosis not present

## 2017-12-30 DIAGNOSIS — Z79899 Other long term (current) drug therapy: Secondary | ICD-10-CM

## 2017-12-30 DIAGNOSIS — D72829 Elevated white blood cell count, unspecified: Secondary | ICD-10-CM | POA: Diagnosis not present

## 2017-12-30 DIAGNOSIS — R6520 Severe sepsis without septic shock: Secondary | ICD-10-CM | POA: Diagnosis not present

## 2017-12-30 DIAGNOSIS — R404 Transient alteration of awareness: Secondary | ICD-10-CM | POA: Diagnosis not present

## 2017-12-30 DIAGNOSIS — I959 Hypotension, unspecified: Secondary | ICD-10-CM | POA: Diagnosis not present

## 2017-12-30 DIAGNOSIS — R0989 Other specified symptoms and signs involving the circulatory and respiratory systems: Secondary | ICD-10-CM | POA: Diagnosis not present

## 2017-12-30 DIAGNOSIS — L89153 Pressure ulcer of sacral region, stage 3: Secondary | ICD-10-CM | POA: Diagnosis not present

## 2017-12-30 DIAGNOSIS — I361 Nonrheumatic tricuspid (valve) insufficiency: Secondary | ICD-10-CM | POA: Diagnosis not present

## 2017-12-30 DIAGNOSIS — N400 Enlarged prostate without lower urinary tract symptoms: Secondary | ICD-10-CM | POA: Diagnosis present

## 2017-12-30 DIAGNOSIS — R0602 Shortness of breath: Secondary | ICD-10-CM

## 2017-12-30 DIAGNOSIS — Z6828 Body mass index (BMI) 28.0-28.9, adult: Secondary | ICD-10-CM

## 2017-12-30 DIAGNOSIS — R579 Shock, unspecified: Secondary | ICD-10-CM | POA: Diagnosis not present

## 2017-12-30 DIAGNOSIS — Z794 Long term (current) use of insulin: Secondary | ICD-10-CM

## 2017-12-30 DIAGNOSIS — Z8673 Personal history of transient ischemic attack (TIA), and cerebral infarction without residual deficits: Secondary | ICD-10-CM | POA: Diagnosis not present

## 2017-12-30 DIAGNOSIS — E785 Hyperlipidemia, unspecified: Secondary | ICD-10-CM | POA: Diagnosis present

## 2017-12-30 DIAGNOSIS — I700 Atherosclerosis of aorta: Secondary | ICD-10-CM | POA: Diagnosis not present

## 2017-12-30 DIAGNOSIS — F319 Bipolar disorder, unspecified: Secondary | ICD-10-CM | POA: Diagnosis present

## 2017-12-30 DIAGNOSIS — R4702 Dysphasia: Secondary | ICD-10-CM | POA: Diagnosis present

## 2017-12-30 DIAGNOSIS — B1920 Unspecified viral hepatitis C without hepatic coma: Secondary | ICD-10-CM | POA: Diagnosis present

## 2017-12-30 DIAGNOSIS — Z89511 Acquired absence of right leg below knee: Secondary | ICD-10-CM | POA: Diagnosis not present

## 2017-12-30 DIAGNOSIS — R0689 Other abnormalities of breathing: Secondary | ICD-10-CM | POA: Diagnosis not present

## 2017-12-30 DIAGNOSIS — R000 Tachycardia, unspecified: Secondary | ICD-10-CM | POA: Diagnosis not present

## 2017-12-30 DIAGNOSIS — K219 Gastro-esophageal reflux disease without esophagitis: Secondary | ICD-10-CM | POA: Diagnosis present

## 2017-12-30 DIAGNOSIS — Z7901 Long term (current) use of anticoagulants: Secondary | ICD-10-CM

## 2017-12-30 DIAGNOSIS — Z931 Gastrostomy status: Secondary | ICD-10-CM

## 2017-12-30 DIAGNOSIS — S81812A Laceration without foreign body, left lower leg, initial encounter: Secondary | ICD-10-CM | POA: Diagnosis present

## 2017-12-30 DIAGNOSIS — E1122 Type 2 diabetes mellitus with diabetic chronic kidney disease: Secondary | ICD-10-CM

## 2017-12-30 DIAGNOSIS — R079 Chest pain, unspecified: Secondary | ICD-10-CM | POA: Diagnosis not present

## 2017-12-30 DIAGNOSIS — Z7401 Bed confinement status: Secondary | ICD-10-CM

## 2017-12-30 DIAGNOSIS — A419 Sepsis, unspecified organism: Secondary | ICD-10-CM | POA: Diagnosis not present

## 2017-12-30 DIAGNOSIS — J189 Pneumonia, unspecified organism: Secondary | ICD-10-CM

## 2017-12-30 DIAGNOSIS — E1151 Type 2 diabetes mellitus with diabetic peripheral angiopathy without gangrene: Secondary | ICD-10-CM | POA: Diagnosis present

## 2017-12-30 DIAGNOSIS — G9340 Encephalopathy, unspecified: Secondary | ICD-10-CM | POA: Diagnosis not present

## 2017-12-30 DIAGNOSIS — Z7189 Other specified counseling: Secondary | ICD-10-CM | POA: Diagnosis not present

## 2017-12-30 DIAGNOSIS — R05 Cough: Secondary | ICD-10-CM | POA: Diagnosis not present

## 2017-12-30 DIAGNOSIS — F0390 Unspecified dementia without behavioral disturbance: Secondary | ICD-10-CM | POA: Diagnosis present

## 2017-12-30 DIAGNOSIS — R627 Adult failure to thrive: Secondary | ICD-10-CM | POA: Diagnosis present

## 2017-12-30 DIAGNOSIS — Z515 Encounter for palliative care: Secondary | ICD-10-CM | POA: Diagnosis not present

## 2017-12-30 LAB — I-STAT CHEM 8, ED
BUN: 54 mg/dL — AB (ref 6–20)
CALCIUM ION: 1.04 mmol/L — AB (ref 1.15–1.40)
CREATININE: 8.2 mg/dL — AB (ref 0.61–1.24)
Chloride: 94 mmol/L — ABNORMAL LOW (ref 101–111)
Glucose, Bld: 240 mg/dL — ABNORMAL HIGH (ref 65–99)
HCT: 38 % — ABNORMAL LOW (ref 39.0–52.0)
Hemoglobin: 12.9 g/dL — ABNORMAL LOW (ref 13.0–17.0)
Potassium: 4.6 mmol/L (ref 3.5–5.1)
SODIUM: 131 mmol/L — AB (ref 135–145)
TCO2: 24 mmol/L (ref 22–32)

## 2017-12-30 LAB — I-STAT ARTERIAL BLOOD GAS, ED
Bicarbonate: 23.8 mmol/L (ref 20.0–28.0)
O2 SAT: 100 %
PO2 ART: 199 mmHg — AB (ref 83.0–108.0)
TCO2: 25 mmol/L (ref 22–32)
pCO2 arterial: 38.6 mmHg (ref 32.0–48.0)
pH, Arterial: 7.405 (ref 7.350–7.450)

## 2017-12-30 LAB — CBC WITH DIFFERENTIAL/PLATELET
ABS IMMATURE GRANULOCYTES: 0.1 10*3/uL (ref 0.0–0.1)
BASOS ABS: 0 10*3/uL (ref 0.0–0.1)
BASOS PCT: 0 %
EOS ABS: 0 10*3/uL (ref 0.0–0.7)
EOS PCT: 0 %
HCT: 35.9 % — ABNORMAL LOW (ref 39.0–52.0)
Hemoglobin: 11.4 g/dL — ABNORMAL LOW (ref 13.0–17.0)
Immature Granulocytes: 1 %
Lymphocytes Relative: 8 %
Lymphs Abs: 0.8 10*3/uL (ref 0.7–4.0)
MCH: 28.1 pg (ref 26.0–34.0)
MCHC: 31.8 g/dL (ref 30.0–36.0)
MCV: 88.6 fL (ref 78.0–100.0)
MONO ABS: 1 10*3/uL (ref 0.1–1.0)
MONOS PCT: 10 %
NEUTROS ABS: 8.5 10*3/uL — AB (ref 1.7–7.7)
Neutrophils Relative %: 81 %
PLATELETS: 317 10*3/uL (ref 150–400)
RBC: 4.05 MIL/uL — ABNORMAL LOW (ref 4.22–5.81)
RDW: 15.9 % — ABNORMAL HIGH (ref 11.5–15.5)
WBC: 10.4 10*3/uL (ref 4.0–10.5)

## 2017-12-30 LAB — PROCALCITONIN: PROCALCITONIN: 3.65 ng/mL

## 2017-12-30 LAB — COMPREHENSIVE METABOLIC PANEL
ALBUMIN: 3 g/dL — AB (ref 3.5–5.0)
ALK PHOS: 54 U/L (ref 38–126)
ALT: 14 U/L — AB (ref 17–63)
AST: 19 U/L (ref 15–41)
Anion gap: 18 — ABNORMAL HIGH (ref 5–15)
BUN: 59 mg/dL — AB (ref 6–20)
CALCIUM: 9.3 mg/dL (ref 8.9–10.3)
CO2: 25 mmol/L (ref 22–32)
CREATININE: 8.88 mg/dL — AB (ref 0.61–1.24)
Chloride: 90 mmol/L — ABNORMAL LOW (ref 101–111)
GFR calc Af Amer: 6 mL/min — ABNORMAL LOW (ref >60)
GFR calc non Af Amer: 5 mL/min — ABNORMAL LOW (ref >60)
GLUCOSE: 238 mg/dL — AB (ref 65–99)
Potassium: 4.7 mmol/L (ref 3.5–5.1)
SODIUM: 133 mmol/L — AB (ref 135–145)
TOTAL PROTEIN: 8.4 g/dL — AB (ref 6.5–8.1)
Total Bilirubin: 0.6 mg/dL (ref 0.3–1.2)

## 2017-12-30 LAB — GLUCOSE, CAPILLARY
Glucose-Capillary: 194 mg/dL — ABNORMAL HIGH (ref 65–99)
Glucose-Capillary: 128 mg/dL — ABNORMAL HIGH (ref 65–99)

## 2017-12-30 LAB — TROPONIN I
TROPONIN I: 0.05 ng/mL — AB (ref <0.03)
Troponin I: 0.05 ng/mL (ref <0.03)

## 2017-12-30 LAB — PROTIME-INR
INR: 3.17
PROTHROMBIN TIME: 32.2 s — AB (ref 11.4–15.2)

## 2017-12-30 LAB — LACTIC ACID, PLASMA
Lactic Acid, Venous: 4.2 mmol/L (ref 0.5–1.9)
Lactic Acid, Venous: 2.6 mmol/L (ref 0.5–1.9)

## 2017-12-30 LAB — I-STAT CG4 LACTIC ACID, ED: Lactic Acid, Venous: 2.79 mmol/L (ref 0.5–1.9)

## 2017-12-30 LAB — MRSA PCR SCREENING: MRSA by PCR: NEGATIVE

## 2017-12-30 MED ORDER — HEPARIN SODIUM (PORCINE) 5000 UNIT/ML IJ SOLN
5000.0000 [IU] | Freq: Three times a day (TID) | INTRAMUSCULAR | Status: DC
  Administered 2017-12-30 – 2017-12-31 (×2): 5000 [IU] via SUBCUTANEOUS
  Filled 2017-12-30 (×3): qty 1

## 2017-12-30 MED ORDER — SODIUM CHLORIDE 0.9 % IV BOLUS (SEPSIS)
1000.0000 mL | Freq: Once | INTRAVENOUS | Status: AC
  Administered 2017-12-30: 1000 mL via INTRAVENOUS

## 2017-12-30 MED ORDER — ORAL CARE MOUTH RINSE
15.0000 mL | Freq: Two times a day (BID) | OROMUCOSAL | Status: DC
  Administered 2017-12-30 – 2018-01-10 (×20): 15 mL via OROMUCOSAL

## 2017-12-30 MED ORDER — AMIODARONE HCL 200 MG PO TABS
200.0000 mg | ORAL_TABLET | Freq: Every day | ORAL | Status: DC
  Administered 2017-12-31 – 2018-01-11 (×12): 200 mg
  Filled 2017-12-30 (×13): qty 1

## 2017-12-30 MED ORDER — PIPERACILLIN-TAZOBACTAM 3.375 G IVPB
3.3750 g | Freq: Two times a day (BID) | INTRAVENOUS | Status: DC
  Administered 2017-12-30 – 2018-01-03 (×8): 3.375 g via INTRAVENOUS
  Filled 2017-12-30 (×9): qty 50

## 2017-12-30 MED ORDER — ACETAMINOPHEN 650 MG RE SUPP
650.0000 mg | Freq: Once | RECTAL | Status: AC
  Administered 2017-12-30: 650 mg via RECTAL
  Filled 2017-12-30: qty 1

## 2017-12-30 MED ORDER — VANCOMYCIN HCL 10 G IV SOLR
2000.0000 mg | Freq: Once | INTRAVENOUS | Status: AC
  Administered 2017-12-30: 2000 mg via INTRAVENOUS
  Filled 2017-12-30: qty 2000

## 2017-12-30 MED ORDER — SODIUM CHLORIDE 0.9 % IV SOLN: 250.0000 mL | INTRAVENOUS | Status: DC | PRN

## 2017-12-30 MED ORDER — NOREPINEPHRINE 4 MG/250ML-% IV SOLN
0.0000 ug/min | INTRAVENOUS | Status: DC
  Administered 2017-12-30: 5 ug/min via INTRAVENOUS
  Administered 2017-12-30: 10 ug/min via INTRAVENOUS
  Filled 2017-12-30: qty 250

## 2017-12-30 MED ORDER — VANCOMYCIN HCL IN DEXTROSE 1-5 GM/200ML-% IV SOLN: 1000.0000 mg | Freq: Once | INTRAVENOUS | Status: DC

## 2017-12-30 MED ORDER — MIDODRINE HCL 5 MG PO TABS
5.0000 mg | ORAL_TABLET | Freq: Two times a day (BID) | ORAL | Status: DC
Start: 2017-12-30 — End: 2018-01-05
  Administered 2017-12-31 – 2018-01-05 (×11): 5 mg
  Filled 2017-12-30 (×13): qty 1

## 2017-12-30 MED ORDER — INSULIN ASPART 100 UNIT/ML ~~LOC~~ SOLN
1.0000 [IU] | SUBCUTANEOUS | Status: DC
  Administered 2017-12-30 (×2): 1 [IU] via SUBCUTANEOUS
  Administered 2017-12-30: 2 [IU] via SUBCUTANEOUS
  Administered 2017-12-31 (×2): 1 [IU] via SUBCUTANEOUS
  Administered 2018-01-01 (×3): 2 [IU] via SUBCUTANEOUS
  Administered 2018-01-01: 3 [IU] via SUBCUTANEOUS
  Administered 2018-01-01 – 2018-01-02 (×2): 2 [IU] via SUBCUTANEOUS

## 2017-12-30 MED ORDER — PIPERACILLIN-TAZOBACTAM 3.375 G IVPB 30 MIN
3.3750 g | Freq: Once | INTRAVENOUS | Status: AC
  Administered 2017-12-30: 3.375 g via INTRAVENOUS
  Filled 2017-12-30: qty 50

## 2017-12-30 MED ORDER — FAMOTIDINE IN NACL 20-0.9 MG/50ML-% IV SOLN
20.0000 mg | INTRAVENOUS | Status: DC
  Administered 2017-12-30: 20 mg via INTRAVENOUS
  Filled 2017-12-30: qty 50

## 2017-12-30 NOTE — ED Notes (Signed)
Pt family reports patient does not make urine at all.

## 2017-12-30 NOTE — ED Notes (Signed)
[facility] EDP request, Pt to received fluids before Levo started.

## 2017-12-30 NOTE — ED Notes (Signed)
Attempted report 

## 2017-12-30 NOTE — ED Notes (Signed)
Patient responding to wife voice.

## 2017-12-30 NOTE — Progress Notes (Signed)
CRITICAL VALUE ALERT  Critical Value:  LA 4.2, TROP 0.05  Date & Time Notied:  12/30/17 1650  Provider Notified: Dr Elwyn Reach  Orders Received/Actions taken: Repeat LA in 2 hours

## 2017-12-30 NOTE — ED Triage Notes (Signed)
Patient presents to the ED from [facility] with complaints of AMS. LSN 1045. [facility] family states patient is verbal at baseline intermittent. Patient recieves Dialysis  [facility]. Patient did not received treatment today. Patient is a bilateral BKA. CBG 269. Patient has a history of Encephalopathy

## 2017-12-30 NOTE — ED Notes (Signed)
CCU at bedside.

## 2017-12-30 NOTE — Progress Notes (Signed)
Pharmacy Antibiotic Note  Oscar Castillo is a 67 y.o. male admitted on 12/30/2017 with sepsis.  Pharmacy has been consulted for vancomycin and Zosyn dosing. Pt is ESRD on HD Tues/Thurs/Sat, last HD unknown at this time. Pt has noted anaphylactic reaction to ceftriaxone, but has tolerated Zosyn during recent admit May 2019.  Plan: -Zosyn 3.375g IV x1 over 30 min then q12h EI -Vancomycin 2000mg  IV x1 then 1000mg  IV qHD -F/U HD schedule and renal plans -Monitor cultures, LOT, vancomycin level as indicated     No data recorded.  Recent Labs  Lab 12/23/17 1600  LATICACIDVEN 1.67    CrCl cannot be calculated (Unknown ideal weight.).    Allergies  Allergen Reactions  . Ceftriaxone Sodium In Dextrose Anaphylaxis  . Ciprofloxacin Other (See Comments)    UNSPECIFIED PAIN  . Nsaids Other (See Comments)    HISTORY OF ULCER  . Penicillins Swelling    SWELLING REACTION UNSPECIFIED  [ PATIENT WITH HX OF ANAPHYLAXIS TO CEFTRIAXONE ] 11/26/17:  pt has received 3 doses of zosyn in past (04/2017.) No allergy tor adverse event to zosyn noted at that time.     . Sulfa Antibiotics Swelling    SWELLING REACTION UNSPECIFIED     Antimicrobials this admission: Vancomycin 6/6 >>  Zosyn 6/6 >>   Dose adjustments this admission: none  Microbiology results: sent  Thank you for allowing pharmacy to be a part of this patient's care.  Arrie Senate, PharmD, BCPS PGY-2 Cardiology Pharmacy Resident Phone: [phone] 12/30/2017

## 2017-12-30 NOTE — H&P (Signed)
PULMONARY / CRITICAL CARE MEDICINE   Name: Oscar Castillo MRN: [id number] DOB: June 06, 1951    ADMISSION DATE:  12/30/2017 CONSULTATION DATE:  6/6  REFERRING MD:  Dr. Sabra Heck EDP  CHIEF COMPLAINT:  Shock  HISTORY OF PRESENT ILLNESS:   67 year old male with multiple medical problems including ESRD on HD, CHF, DM, Hepatitis C, HTN, and bilateral BKA. He was recently admitted to [facility] for what is felt at this point to be a viral encephalitis. He was admitted for about 2 weeks and improved slowly before being discharged to [facility] from which he was discharged to SNF. Wife states he had not truly returned to his baseline. 6/6 early AM wife felt as though he was not acting like himself. He was found to be febrile and hypotensive prompting transfer to [facility] ED. In the ED he was hypotensive refractory to IVF resuscitation. He was started on pressors. Etiology of shock felt to be sepsis, although no source identified. PCCM asked to admit.    PAST MEDICAL HISTORY :  He  has a past medical history of Anemia, Antral ulcer (2014), BENIGN PROSTATIC HYPERTROPHY, Bipolar disorder ([place]), CHOLELITHIASIS, Chronic combined systolic and diastolic CHF (congestive heart failure) ([place]), Complication of anesthesia, CVA (cerebral vascular accident) ([place]) (07/2007), DEPRESSION, DIABETES MELLITUS, TYPE II, ERECTILE DYSFUNCTION, ESRD on hemodialysis ([place]), GERD, GI bleed, Hepatitis C, History of Clostridium difficile, History of kidney stones, HYPERTENSION, LBBB (left bundle branch block), Morbid obesity ([place]), and PAF (paroxysmal atrial fibrillation) ([place]).  PAST SURGICAL HISTORY: He  has a past surgical history that includes AV fistula placement (06/14/2012); Tibia IM nail insertion (Left, 09/09/2012); ORIF fibula fracture (Left, 09/09/2012); Colonoscopy (N/A, 10/28/2012); Esophagogastroduodenoscopy (N/A, 11/02/2012); Colonoscopy (N/A, 11/02/2012); Colonoscopy (N/A, 11/03/2012); Givens capsule study (N/A, 11/04/2012);  enteroscopy (N/A, 11/08/2012); Amputation (Left, 05/12/2013); Eye surgery (Left); Amputation (Left, 06/09/2013); Hardware Removal (Left, 06/09/2013); Amputation (Right, 09/08/2013); Amputation (Right, 10/11/2013); Colonoscopy (N/A, 09/16/2016); Esophagogastroduodenoscopy (egd) with propofol (N/A, 09/16/2016); ir generic historical (09/27/2016); ir generic historical (09/27/2016); Givens capsule study (N/A, 09/29/2016); Ligation of arteriovenous  fistula (Left, 11/09/2016); Insertion of dialysis catheter (N/A, 11/09/2016); Exchange of a dialysis catheter (Left, 11/13/2016); UPPER EXTREMITY VENOGRAPHY (N/A, 12/14/2016); Bascilic vein transposition (Left, 02/17/2017); Bascilic vein transposition (Left, 04/21/2017); TEMPORARY PACEMAKER (N/A, 04/29/2017); PACEMAKER IMPLANT (N/A, 04/30/2017); AV fistula placement (Left, 07/21/2017); Hematoma evacuation (Left, 04/29/2017); and IR Replc Gastro/Colonic Tube Percut W/Fluoro (12/23/2017).  Allergies  Allergen Reactions  . Ceftriaxone Sodium In Dextrose Anaphylaxis  . Ciprofloxacin Other (See Comments)    UNSPECIFIED PAIN  . Nsaids Other (See Comments)    HISTORY OF ULCER  . Penicillins Swelling    SWELLING REACTION UNSPECIFIED  [ PATIENT WITH HX OF ANAPHYLAXIS TO CEFTRIAXONE ] 11/26/17:  pt has received 3 doses of zosyn in past (04/2017.) No allergy tor adverse event to zosyn noted at that time.     . Sulfa Antibiotics Swelling    SWELLING REACTION UNSPECIFIED     No current facility-administered medications on file prior to encounter.    Current Outpatient Medications on File Prior to Encounter  Medication Sig  . acetaminophen (TYLENOL) 325 MG tablet Take 650 mg by mouth every 8 (eight) hours. Do not exceed 4 GMS of Tylenol  In 24 hours  . Amino Acids-Protein Hydrolys (FEEDING SUPPLEMENT, PRO-STAT SUGAR FREE 64,) LIQD Place 30 mLs into feeding tube 2 (two) times daily. (Patient taking differently: Place 30 mLs into feeding tube every 12 (twelve) hours. )  . amiodarone  (PACERONE) 200 MG tablet Place  1 tablet (200 mg total) into feeding tube daily.  [name] atorvastatin (LIPITOR) 40 MG tablet Place 1 tablet (40 mg total) into feeding tube at bedtime.  [name] escitalopram (LEXAPRO) 10 MG tablet Place 10 mg into feeding tube daily.   . famotidine (PEPCID) 20 MG tablet Place 20 mg into feeding tube at bedtime.   . insulin lispro (HUMALOG) 100 UNIT/ML injection Inject 100 Units into the skin every 6 (six) hours. Sliding Scale Insulin as follows: 201-250 = 2 units 251-300 = 4 units 301-350 = 6 units 351-400 = 8 units 401-540  = 10 units Above 450 give 12 units and recheck in 2 hours  If check blood sugar glucose still above 450  Notify MD Notify MD if checked blood glucose  Under 60)  . latanoprost (XALATAN) 0.005 % ophthalmic solution Place 1 drop into both eyes at bedtime.   . midodrine (PROAMATINE) 5 MG tablet Place 1 tablet (5 mg total) into feeding tube 2 (two) times daily with a meal. (Patient taking differently: Place 5 mg into feeding tube every 12 (twelve) hours. )  . Nutritional Supplements (FEEDING SUPPLEMENT, NEPRO CARB STEADY,) LIQD Place 1,000 mLs into feeding tube daily. (Patient taking differently: Place 1,000 mLs into feeding tube daily. Nepro 55 ml/hr)  . sevelamer carbonate (RENVELA) 800 MG tablet Place 1,600 mg into feeding tube 2 (two) times daily.  . sorbitol 70 % SOLN Take 30 mLs by mouth 2 (two) times daily. (Patient taking differently: Place 30 mLs into feeding tube every 12 (twelve) hours. )  . warfarin (COUMADIN) 5 MG tablet Place 5.5 mg into feeding tube one time only at 6 PM.  . acetaminophen (TYLENOL) 160 MG/5ML solution Place 20.3 mLs (650 mg total) into feeding tube every 6 (six) hours as needed for headache or fever. (Patient not taking: Reported on 12/30/2017)  . famotidine (PEPCID) 40 MG/5ML suspension Place 2.5 mLs (20 mg total) into feeding tube daily. (Patient not taking: Reported on 12/30/2017)  . heparin 100-0.45 UNIT/ML-% infusion Inject  1,000 Units/hr into the vein continuous. (Patient not taking: Reported on 12/30/2017)  . QUEtiapine (SEROQUEL) 25 MG tablet Take 0.5 tablets (12.5 mg total) by mouth at bedtime. (Patient not taking: Reported on 12/30/2017)  . silver sulfADIAZINE (SSD) 1 % cream APPLY EXTERNALLY TO THE AFFECTED AREA DAILY (Patient not taking: Reported on 12/30/2017)    FAMILY HISTORY:  His indicated that his mother is deceased. He indicated that his father is deceased. He indicated that all of his three sisters are alive. He indicated that his brother is alive. He indicated that his maternal grandmother is deceased. He indicated that his maternal grandfather is deceased. He indicated that his paternal grandmother is deceased. He indicated that his paternal grandfather is deceased. He indicated that both of his daughters are alive. He indicated that the status of his other is unknown.   SOCIAL HISTORY: He  reports that he has never smoked. He has never used smokeless tobacco. He reports that he does not drink alcohol or use drugs.  REVIEW OF SYSTEMS:   Unable  SUBJECTIVE:    VITAL SIGNS: BP 106/69   Pulse 62   Temp (!) 102 F (38.9 C) (Rectal)   Resp (!) 36   Ht 5\' 10"  (1.778 m)   Wt 87.5 kg (193 lb)   SpO2 98%   BMI 27.69 kg/m   HEMODYNAMICS:    VENTILATOR SETTINGS:    INTAKE / OUTPUT: No intake/output data recorded.  PHYSICAL EXAMINATION: General:  Overweight  elderly man in NAD Neuro:  Drowsy, but will arouse and follow commands.  HEENT:  [place]/AT, PERRL, no appreciable JVD Cardiovascular:  RRR, no MRG Lungs:  Clear, bilateral breath sounds Abdomen:  Soft, diffusely tender.  Musculoskeletal:  Bilateral BKA Skin:  Grossly intact.   LABS:  BMET Recent Labs  Lab 12/30/17 1242 12/30/17 1243  NA 133* 131*  K 4.7 4.6  CL 90* 94*  CO2 25  --   BUN 59* 54*  CREATININE 8.88* 8.20*  GLUCOSE 238* 240*    Electrolytes Recent Labs  Lab 12/30/17 1242  CALCIUM 9.3    CBC Recent Labs   Lab 12/30/17 1242 12/30/17 1243  WBC 10.4  --   HGB 11.4* 12.9*  HCT 35.9* 38.0*  PLT 317  --     Coag's No results for input(s): APTT, INR in the last 168 hours.  Sepsis Markers Recent Labs  Lab 12/23/17 1600 12/30/17 1244  LATICACIDVEN 1.67 2.79*    ABG Recent Labs  Lab 12/30/17 1326  PHART 7.405  PCO2ART 38.6  PO2ART 199.0*    Liver Enzymes Recent Labs  Lab 12/30/17 1242  AST 19  ALT 14*  ALKPHOS 54  BILITOT 0.6  ALBUMIN 3.0*    Cardiac Enzymes No results for input(s): TROPONINI, PROBNP in the last 168 hours.  Glucose No results for input(s): GLUCAP in the last 168 hours.  Imaging Dg Chest Port 1 View  Result Date: 12/30/2017 CLINICAL DATA:  Cough.  Altered mental status. EXAM: PORTABLE CHEST 1 VIEW COMPARISON:  12/23/2017. FINDINGS: Left jugular catheter tip in the right atrium. Stable right subclavian pacemaker leads. Normal sized heart. Clear lungs with normal vascularity. Thoracic spine degenerative changes. IMPRESSION: No acute abnormality. The left jugular catheter tip remains in the right atrium. Electronically Signed   By: Claudie Revering M.D.   On: 12/30/2017 12:50     STUDIES:  CT abdomen/pelvis 6/6 >  CULTURES: Blood 6/6 > Urine 6/6 >  ANTIBIOTICS: Zosyn 6/6 > Vancomycin 6/6 >  SIGNIFICANT EVENTS:   LINES/TUBES: PEG (Pre-admit) >  DISCUSSION: 67 year old male with ESRD on HD and recent admit for what is thought to be viral encephalitis. Now presenting with fever and hypotension requiring pressors. Admit to ICU on empiric ABX and levophed. Source unclear, but belly tender and recent PEG change. Will CT abdomen.   ASSESSMENT / PLAN:  PULMONARY A: No acute issues  P:   Monitor mental status. At risk poor airway protection.   CARDIOVASCULAR A:  Shock, etiology unclear. Septic vs hypovolemic.  PAF on warfarin Chronic diastolic CHF  P:  Aggressive IVF resuscitation Levophed to keep MAP > 32mmHg Trend lactic,  troponin Echocardiogram.   RENAL A:   ESRD on HD  P:   Follow BMP No need for urgent HD. Was intended to receive 6/6. Hold off on nephrology consult.    GASTROINTESTINAL A:   GERD  P:   Pepcid NPO  HEMATOLOGIC A:   Warfarin coagulopathy  P:  Assess INR, if subtherapeutic will need to add heparin infusion.  Follow CBC SQ heparin  INFECTIOUS A:   SIRS, possible sepsis. No clear source.   P:   Empiric ABX as above Follow cultures UA pending CT abdomen (tenderness in ED)  ENDOCRINE A:   DM  P:   CBG monitoring and SSI  NEUROLOGIC A:   Acute metabolic encephalopathy P:   RASS goal: 0 Monitor   FAMILY  - Updates: Wife updated in ED   -  Inter-disciplinary family meet or Palliative Care meeting due by:  6/13   Georgann Housekeeper, AGACNP-BC [place] [facility] Pager [phone] or [phone]  12/30/2017 2:30 PM

## 2017-12-30 NOTE — ED Notes (Signed)
CCU NP at bedside.

## 2017-12-30 NOTE — ED Provider Notes (Signed)
[facility] EMERGENCY DEPARTMENT Provider Note   CSN: [id number] Arrival date & time: 12/30/17  1216     History   Chief Complaint Chief Complaint  Patient presents with  . Altered Mental Status    HPI Oscar Castillo is a 67 y.o. male.  HPI  The patient is a 67 year old male, he is severely ill, he has a history of bilateral low the knee amputations as well as a history of end-stage renal disease on dialysis, history of diabetes, complete heart block status post pacemaker, he currently lives in a nursing facility after he had an extended stay admission in the hospital from a metabolic encephalopathy that was thought to be related to a viral encephalitis.  He presents to the hospital today after his wife saw him at the nursing facility this morning, felt like he had a fever and had some chills.  He was checked and found to be 103, paramedics transported the patient as he was tachycardic, thought to be febrile, hypotensive.  The patient is unable to give any information, level 5 caveat applies.  There is no other reports of vomiting diarrhea coughing or any other complaints.  Most of the information comes from the prior medical record which I reviewed.  Discussed care with the wife who agrees with prior  Past Medical History:  Diagnosis Date  . Anemia   . Antral ulcer 2014   small  . BENIGN PROSTATIC HYPERTROPHY   . Bipolar disorder ([place])    "sometimes" (10/07/2016)  . CHOLELITHIASIS   . Chronic combined systolic and diastolic CHF (congestive heart failure) ([place])   . Complication of anesthesia    wife states pt had trouble waking up with in Nov., 2014  . CVA (cerebral vascular accident) ([place]) 07/2007   No residual effect  . DEPRESSION   . DIABETES MELLITUS, TYPE II    diet control.  No medication  since November 2015  . ERECTILE DYSFUNCTION   . ESRD on hemodialysis ([place])    ESRD due to DM/HTN. .  HD TTS at [facility] on [address].  [name] GERD   . GI bleed     due to gastritis, discharged 10/02/16/notes 10/07/2016  . Hepatitis C    C - has been treated  . History of Clostridium difficile   . History of kidney stones   . HYPERTENSION   . LBBB (left bundle branch block)   . Morbid obesity ([place])   . PAF (paroxysmal atrial fibrillation) ([place])    a. Dx 12/2015.    Patient Active Problem List   Diagnosis Date Noted  . Pressure injury of skin 11/27/2017  . Toxic metabolic encephalopathy 62/69/4854  . Pulmonary edema   . Septic shock ([place]) 11/22/2017  . Severe sepsis ([place]) 11/21/2017  . Chronic pain 09/07/2017  . CHB (complete heart block) ([place]) 04/29/2017  . Heart block AV third degree ([place]) 04/29/2017  . PAD (peripheral artery disease) ([place])   . Gait disorder 02/10/2017  . Anemia due to stage 5 chronic kidney disease ([place])   . Benign neoplasm of descending colon   . Duodenal ulcer without hemorrhage or perforation   . Paroxysmal atrial fibrillation ([place]) 09/15/2016  . Glaucoma 12/27/2015  . LBBB (left bundle branch block)   . Liver fibrosis 10/07/2015  . Cigarette nicotine dependence without complication 62/70/3500  . Obesity (BMI 30-39.9) 06/06/2015  . Organ transplant candidate 05/17/2015  . DM (diabetes mellitus) with peripheral vascular complication ([place]) 93/81/8299  . Insulin-dependent  diabetes mellitus with proliferative retinopathy ([place]) 05/17/2015  . History of alcoholism ([place]) 05/10/2015  . Ulcer of sacral region, stage 3 ([place]) 09/26/2013  . S/P BKA (below knee amputation) bilateral ([place]) 09/08/2013  . ESRD on hemodialysis ([place]) 05/09/2013  . Cerebrovascular accident ([place]) 02/20/2013  . Acute on chronic combined systolic and diastolic CHF (congestive heart failure) ([place]) 10/28/2012  . GI bleed 10/27/2012  . Mass in rectum 10/27/2012  . Type 2 diabetes mellitus with chronic kidney disease on chronic dialysis, with long-term current use of insulin ([place]) 09/08/2012  . Essential (primary) hypertension 09/08/2012  . LVH (left  ventricular hypertrophy)-severe concentric 06/13/2012  . Hyperlipidemia, unspecified 01/30/2011  . CHOLELITHIASIS 08/01/2010  . BENIGN PROSTATIC HYPERTROPHY 08/01/2010  . History of cardiovascular disorder 08/06/2009  . Depression 03/18/2009  . NEPHROLITHIASIS, HX OF 03/18/2009  . Major depressive disorder, single episode, unspecified 03/18/2009  . Morbid obesity ([place]) 03/25/2007  . Hypertensive heart disease with CHF (congestive heart failure) ([place]) 03/25/2007  . GERD (gastroesophageal reflux disease) 03/25/2007  . Chronic hepatitis C without hepatic coma ([place]) 03/25/2007    Past Surgical History:  Procedure Laterality Date  . AMPUTATION Left 05/12/2013   Procedure: AMPUTATION RAY;  Surgeon: Newt Minion, MD;  Location: [facility];  Service: Orthopedics;  Laterality: Left;  Left Foot 1st Ray Amputation  . AMPUTATION Left 06/09/2013   Procedure: AMPUTATION BELOW KNEE;  Surgeon: Newt Minion, MD;  Location: [facility];  Service: Orthopedics;  Laterality: Left;  Left Below Knee Amputation and removal proximal screws IM tibial nail  . AMPUTATION Right 09/08/2013   Procedure: AMPUTATION BELOW KNEE;  Surgeon: Newt Minion, MD;  Location: [place];  Service: Orthopedics;  Laterality: Right;  Right Below Knee Amputation  . AMPUTATION Right 10/11/2013   Procedure: AMPUTATION BELOW KNEE;  Surgeon: Newt Minion, MD;  Location: [facility];  Service: Orthopedics;  Laterality: Right;  Right Below Knee Amputation Revision  . AV FISTULA PLACEMENT  06/14/2012   Procedure: ARTERIOVENOUS (AV) FISTULA CREATION;  Surgeon: Angelia Mould, MD;  Location[facility];  Service: Vascular;  Laterality: Left;  Left basilic vein transposition with fistula.  . AV FISTULA PLACEMENT Left 07/21/2017   Procedure: Attempt INSERTION OF ARTERIOVENOUS (AV) WITH  ARTEGRAFT Left  UPPER ARM;  Surgeon: Conrad Pembroke, MD;  Location: [place];  Service: Vascular;  Laterality: Left;  . BASCILIC VEIN TRANSPOSITION Left 02/17/2017   Procedure: LEFT  1ST STAGE BRACHIAL VEIN TRANSPOSITION;  Surgeon: Conrad Hico, MD;  Location: [place];  Service: Vascular;  Laterality: Left;  . BASCILIC VEIN TRANSPOSITION Left 04/21/2017   Procedure: LEFT 2ND STAGE BRACHIAL VEIN TRANSPOSITION;  Surgeon: Conrad [place] Ford, MD;  Location: [place];  Service: Vascular;  Laterality: Left;  . COLONOSCOPY N/A 10/28/2012   Procedure: COLONOSCOPY;  Surgeon: Jeryl Columbia, MD;  Location: [facility] ENDOSCOPY;  Service: Endoscopy;  Laterality: N/A;  . COLONOSCOPY N/A 11/02/2012   Procedure: COLONOSCOPY;  Surgeon: Cleotis Nipper, MD;  Location: [facility] ENDOSCOPY;  Service: Endoscopy;  Laterality: N/A;  . COLONOSCOPY N/A 11/03/2012   Procedure: COLONOSCOPY;  Surgeon: Cleotis Nipper, MD;  Location: [facility] ENDOSCOPY;  Service: Endoscopy;  Laterality: N/A;  . COLONOSCOPY N/A 09/16/2016   Procedure: COLONOSCOPY;  Surgeon: Jerene Bears, MD;  Location: [facility] ENDOSCOPY;  Service: Gastroenterology;  Laterality: N/A;  . ENTEROSCOPY N/A 11/08/2012   Procedure: ENTEROSCOPY;  Surgeon: Wonda Horner, MD;  Location: [facility] ENDOSCOPY;  Service: Endoscopy;  Laterality: N/A;  . ESOPHAGOGASTRODUODENOSCOPY N/A  11/02/2012   Procedure: ESOPHAGOGASTRODUODENOSCOPY (EGD);  Surgeon: Cleotis Nipper, MD;  Location: [facility] ENDOSCOPY;  Service: Endoscopy;  Laterality: N/A;  . ESOPHAGOGASTRODUODENOSCOPY (EGD) WITH PROPOFOL N/A 09/16/2016   Procedure: ESOPHAGOGASTRODUODENOSCOPY (EGD) WITH PROPOFOL;  Surgeon: Jerene Bears, MD;  Location: [facility] ENDOSCOPY;  Service: Gastroenterology;  Laterality: N/A;  . EXCHANGE OF A DIALYSIS CATHETER Left 11/13/2016   Procedure: EXCHANGE OF A DIALYSIS CATHETER;  Surgeon: Elam Dutch, MD;  Location: [facility];  Service: Vascular;  Laterality: Left;  . EYE SURGERY Left    to remove scar tissue  . GIVENS CAPSULE STUDY N/A 11/04/2012   Procedure: GIVENS CAPSULE STUDY;  Surgeon: Cleotis Nipper, MD;  Location: [facility] ENDOSCOPY;  Service: Endoscopy;  Laterality: N/A;  . GIVENS CAPSULE STUDY N/A 09/29/2016   Procedure:  GIVENS CAPSULE STUDY;  Surgeon: Manus Gunning, MD;  Location: [place];  Service: Gastroenterology;  Laterality: N/A;  try to keep pt up in bedside chair as much as possible during the study.    [name] HARDWARE REMOVAL Left 06/09/2013   Procedure: HARDWARE REMOVAL;  Surgeon: Newt Minion, MD;  Location: [facility];  Service: Orthopedics;  Laterality: Left;  Left Below Knee Amputation  and Removal proximal screws IM tibial nail  . HEMATOMA EVACUATION Left 04/29/2017   Procedure: EVACUATION HEMATOMA LEFT ARM;  Surgeon: Conrad Maitland, MD;  Location: [facility];  Service: Vascular;  Laterality: Left;  . INSERTION OF DIALYSIS CATHETER N/A 11/09/2016   Procedure: INSERTION OF TEMPORARY DIALYSIS CATHETER;  Surgeon: Elam Dutch, MD;  Location: [facility];  Service: Vascular;  Laterality: N/A;  . IR GENERIC HISTORICAL  09/27/2016   IR US GUIDE VASC ACCESS RIGHT 09/27/2016 Aletta Edouard, MD [facility]-INTER[facility] RAD  . IR GENERIC HISTORICAL  09/27/2016   IR FLUORO GUIDE CV LINE RIGHT 09/27/2016 Aletta Edouard, MD [facility]  . IR [place] PERCUT W/FLUORO  12/23/2017  . LIGATION OF ARTERIOVENOUS  FISTULA Left 11/09/2016   Procedure: LIGATION OF ARTERIOVENOUS  FISTULA;  Surgeon: Elam Dutch, MD;  Location: [place];  Service: Vascular;  Laterality: Left;  . ORIF FIBULA FRACTURE Left 09/09/2012   Procedure: OPEN REDUCTION INTERNAL FIXATION (ORIF) FIBULA FRACTURE;  Surgeon: Johnny Bridge, MD;  Location: [place];  Service: Orthopedics;  Laterality: Left;  . PACEMAKER IMPLANT N/A 04/30/2017   Procedure: PACEMAKER IMPLANT;  Surgeon: Evans Lance, MD;  Location: [place] CV LAB;  Service: Cardiovascular;  Laterality: N/A;  . TEMPORARY PACEMAKER N/A 04/29/2017   Procedure: TEMPORARY PACEMAKER;  Surgeon: Lorretta Harp, MD;  Location: [place] CV LAB;  Service: Cardiovascular;  Laterality: N/A;  . TIBIA IM NAIL INSERTION Left 09/09/2012   Procedure: INTRAMEDULLARY (IM) NAIL TIBIAL;  Surgeon: Johnny Bridge,  MD;  Location: [place];  Service: Orthopedics;  Laterality: Left;  left tibial nail and open reduction internal fixation left fibula fracture  . UPPER EXTREMITY VENOGRAPHY N/A 12/14/2016   Procedure: Bilateral Upper Extremity Venography and Central Venography;  Surgeon: Conrad Am[place]st Center, MD;  Location: [place] CV LAB;  Service: Cardiovascular;  Laterality: N/A;        Home Medications    Prior to Admission medications   Medication Sig Start Date End Date Taking? Authorizing Provider  acetaminophen (TYLENOL) 160 MG/5ML solution Place 20.3 mLs (650 mg total) into feeding tube every 6 (six) hours as needed for headache or fever. 12/02/17   Lavina Hamman, MD  Amino Acids-Protein Hydrolys (FEEDING SUPPLEMENT, PRO-STAT SUGAR FREE 64,)  LIQD Place 30 mLs into feeding tube 2 (two) times daily. 12/02/17   Lavina Hamman, MD  amiodarone (PACERONE) 200 MG tablet Place 1 tablet (200 mg total) into feeding tube daily. 12/03/17   Lavina Hamman, MD  atorvastatin (LIPITOR) 40 MG tablet Place 1 tablet (40 mg total) into feeding tube at bedtime. 12/02/17   Lavina Hamman, MD  calcium acetate (PHOSLO) 667 MG capsule Take 1,334 mg by mouth 2 (two) times daily.     [provider]  famotidine (PEPCID) 40 MG/5ML suspension Place 2.5 mLs (20 mg total) into feeding tube daily. 12/03/17   Lavina Hamman, MD  heparin 100-0.45 UNIT/ML-% infusion Inject 1,000 Units/hr into the vein continuous. 12/02/17   Lavina Hamman, MD  latanoprost (XALATAN) 0.005 % ophthalmic solution Place 1 drop into both eyes at bedtime.  06/12/16   [provider]  midodrine (PROAMATINE) 5 MG tablet Place 1 tablet (5 mg total) into feeding tube 2 (two) times daily with a meal. 12/02/17   Lavina Hamman, MD  Nutritional Supplements (FEEDING SUPPLEMENT, NEPRO CARB STEADY,) LIQD Place 1,000 mLs into feeding tube daily. 12/02/17   Lavina Hamman, MD  QUEtiapine (SEROQUEL) 25 MG tablet Take 0.5 tablets (12.5 mg total) by mouth at bedtime.  05/09/17   Nita Sells, MD  silver sulfADIAZINE (SSD) 1 % cream APPLY EXTERNALLY TO THE AFFECTED AREA DAILY Patient taking differently: Apply 1 application topically daily as needed (skin care).  07/30/17   Suzan Slick, NP  sorbitol 70 % SOLN Take 30 mLs by mouth 2 (two) times daily. Patient taking differently: Take 30 mLs by mouth 2 (two) times daily as needed for moderate constipation.  05/09/17   Nita Sells, MD    Family History Family History  Problem Relation Age of Onset  . Diabetes Mother   . Hypertension Mother   . Heart attack Father   . Hypertension Father   . Coronary artery disease Other     Social History Social History   Tobacco Use  . Smoking status: Never Smoker  . Smokeless tobacco: Never Used  Substance Use Topics  . Alcohol use: No  . Drug use: No     Allergies   Ceftriaxone sodium in dextrose; Ciprofloxacin; Nsaids; Penicillins; and Sulfa antibiotics   Review of Systems Review of Systems  Unable to perform ROS: Patient unresponsive     Physical Exam Updated Vital Signs BP (!) 85/49   Pulse (!) 106   Temp (!) 102 F (38.9 C) (Rectal)   Resp (!) 33   SpO2 100%   Physical Exam  Constitutional: He appears well-developed. He appears distressed.  HENT:  Head: Normocephalic and atraumatic.  Mouth/Throat: Oropharynx is clear and moist. No oropharyngeal exudate.  Eyes: Pupils are equal, round, and reactive to light. Conjunctivae and EOM are normal. Right eye exhibits no discharge. Left eye exhibits no discharge. No scleral icterus.  Neck: Normal range of motion. Neck supple. No JVD present. No thyromegaly present.  Cardiovascular: Regular rhythm and intact distal pulses. Exam reveals no gallop and no friction rub.  Murmur heard. Tachycardic, soft murmur  Pulmonary/Chest: Breath sounds normal. He is in respiratory distress. He has no wheezes.  Tachypneic, diffuse rhonchi  Abdominal: Soft. Bowel sounds are normal. He exhibits no  distension and no mass. There is no tenderness.  Musculoskeletal: Normal range of motion. He exhibits no edema or tenderness.  Lateral below the knee irritations present, right side seems flaccid, left side is  able to move, both arms have some twitching movements  Lymphadenopathy:    He has no cervical adenopathy.  Neurological:  The patient is somnolent, arousable to painful stimuli, he does not follow commands, he does not speak spontaneously  Skin: Skin is warm and dry. No rash noted. No erythema.  Psychiatric: He has a normal mood and affect. His behavior is normal.  Nursing note and vitals reviewed.    ED Treatments / Results  Labs (all labs ordered are listed, but only abnormal results are displayed) Labs Reviewed  CULTURE, BLOOD (ROUTINE X 2)  CULTURE, BLOOD (ROUTINE X 2)  URINE CULTURE  COMPREHENSIVE METABOLIC PANEL  CBC WITH DIFFERENTIAL/PLATELET  URINALYSIS, ROUTINE W REFLEX MICROSCOPIC  I-STAT CG4 LACTIC ACID, ED  I-STAT CHEM 8, ED  I-STAT ARTERIAL BLOOD GAS, ED    EKG EKG Interpretation  Date/Time:  Thursday December 30 2017 12:23:11 EDT Ventricular Rate:  106 PR Interval:    QRS Duration: 163 QT Interval:  384 QTC Calculation: 506 R Axis:   -26 Text Interpretation:  Ectopic atrial tachycardia, unifocal Atrial premature complex Left bundle branch block since last tracing no significant change Confirmed by Noemi Chapel [phone]) on 12/30/2017 12:27:10 PM   Radiology No results found.  Procedures .Critical Care Performed by: Noemi Chapel, MD Authorized by: Noemi Chapel, MD   Critical care provider statement:    Critical care time (minutes):  35   Critical care time was exclusive of:  Separately billable procedures and treating other patients and teaching time   Critical care was necessary to treat or prevent imminent or life-threatening deterioration of the following conditions:  Shock and sepsis   Critical care was time spent personally by me on the  following activities:  Blood draw for specimens, development of treatment plan with patient or surrogate, discussions with consultants, evaluation of patient's response to treatment, examination of patient, obtaining history from patient or surrogate, ordering and performing treatments and interventions, ordering and review of laboratory studies, ordering and review of radiographic studies, pulse oximetry, re-evaluation of patient's condition and review of old charts   (including critical care time)  Medications Ordered in ED Medications  norepinephrine (LEVOPHED) 4mg  in D5W 226mL premix infusion (10 mcg/min Intravenous New Bag/Given 12/30/17 1237)  piperacillin-tazobactam (ZOSYN) IVPB 3.375 g (has no administration in time range)  sodium chloride 0.9 % bolus 1,000 mL (has no administration in time range)    And  sodium chloride 0.9 % bolus 1,000 mL (has no administration in time range)    And  sodium chloride 0.9 % bolus 1,000 mL (1,000 mLs Intravenous New Bag/Given 12/30/17 1238)  vancomycin (VANCOCIN) 2,000 mg in sodium chloride 0.9 % 500 mL IVPB (has no administration in time range)  piperacillin-tazobactam (ZOSYN) IVPB 3.375 g (has no administration in time range)     Initial Impression / Assessment and Plan / ED Course  I have reviewed the triage vital signs and the nursing notes.  Pertinent labs & imaging results that were available during my care of the patient were reviewed by me and considered in my medical decision making (see chart for details).     The patient appears in septic shock, he has a very low blood pressure which is persistent despite getting levo fed and IV fluids.  The patient has  A decreased level of consciousness with an ongoing fever despite rectal Tylenol  He has persistent tachycardia and meets conditions for septic shock without a source however given his history of dialysis I  suspect this is bacteremia or sepsis from septicemia from dialysis  I discussed  his care with the intensivist who will admit the patient to the intensive care unit I discussed his care with his wife who states that he should be a full code at this time  , Everything to be done including CPR and intubation.  The patient is critically ill  He was given 30 cc of IV fluids per kilogram, broad-spectrum antibiotics, sepsis protocol followed.  Sepsis - Repeat Assessment  Performed at:    1:13 PM   Vitals     Blood pressure 102/60, pulse (!) 110, temperature (!) 102 F (38.9 C), temperature source Rectal, resp. rate (!) 34, SpO2 100 %.  Heart:     Tachycardic  Lungs:    Rales and Rhonchi  Capillary Refill:   <2 sec  Peripheral Pulse:   Dorsalis pedis pulse  palpable  Skin:     Normal Color      Final Clinical Impressions(s) / ED Diagnoses   Final diagnoses:  Septic shock (HCC)      Noemi Chapel, MD 12/30/17 1313

## 2017-12-31 ENCOUNTER — Other Ambulatory Visit (HOSPITAL_COMMUNITY): Payer: Self-pay

## 2017-12-31 DIAGNOSIS — N186 End stage renal disease: Secondary | ICD-10-CM

## 2017-12-31 DIAGNOSIS — A419 Sepsis, unspecified organism: Principal | ICD-10-CM

## 2017-12-31 DIAGNOSIS — G9340 Encephalopathy, unspecified: Secondary | ICD-10-CM

## 2017-12-31 LAB — BLOOD CULTURE ID PANEL (REFLEXED)
ACINETOBACTER BAUMANNII: NOT DETECTED
CANDIDA ALBICANS: NOT DETECTED
Candida glabrata: NOT DETECTED
Candida krusei: NOT DETECTED
Candida parapsilosis: NOT DETECTED
Candida tropicalis: NOT DETECTED
ENTEROBACTERIACEAE SPECIES: NOT DETECTED
ENTEROCOCCUS SPECIES: NOT DETECTED
Enterobacter cloacae complex: NOT DETECTED
Escherichia coli: NOT DETECTED
HAEMOPHILUS INFLUENZAE: NOT DETECTED
Klebsiella oxytoca: NOT DETECTED
Klebsiella pneumoniae: NOT DETECTED
LISTERIA MONOCYTOGENES: NOT DETECTED
METHICILLIN RESISTANCE: DETECTED — AB
NEISSERIA MENINGITIDIS: NOT DETECTED
PSEUDOMONAS AERUGINOSA: NOT DETECTED
Proteus species: NOT DETECTED
STREPTOCOCCUS AGALACTIAE: NOT DETECTED
STREPTOCOCCUS PNEUMONIAE: NOT DETECTED
STREPTOCOCCUS PYOGENES: NOT DETECTED
STREPTOCOCCUS SPECIES: NOT DETECTED
Serratia marcescens: NOT DETECTED
Staphylococcus aureus (BCID): NOT DETECTED
Staphylococcus species: DETECTED — AB

## 2017-12-31 LAB — GLUCOSE, CAPILLARY
GLUCOSE-CAPILLARY: 137 mg/dL — AB (ref 65–99)
GLUCOSE-CAPILLARY: 87 mg/dL (ref 65–99)
GLUCOSE-CAPILLARY: 135 mg/dL — AB (ref 65–99)
Glucose-Capillary: 84 mg/dL (ref 65–99)
Glucose-Capillary: 72 mg/dL (ref 65–99)
Glucose-Capillary: 81 mg/dL (ref 65–99)
Glucose-Capillary: 128 mg/dL — ABNORMAL HIGH (ref 65–99)

## 2017-12-31 LAB — CBC
HCT: 29.1 % — ABNORMAL LOW (ref 39.0–52.0)
HEMOGLOBIN: 9.3 g/dL — AB (ref 13.0–17.0)
MCH: 28.4 pg (ref 26.0–34.0)
MCHC: 32 g/dL (ref 30.0–36.0)
MCV: 89 fL (ref 78.0–100.0)
Platelets: 235 10*3/uL (ref 150–400)
RBC: 3.27 MIL/uL — AB (ref 4.22–5.81)
RDW: 16 % — ABNORMAL HIGH (ref 11.5–15.5)
WBC: 12.1 10*3/uL — ABNORMAL HIGH (ref 4.0–10.5)

## 2017-12-31 LAB — BASIC METABOLIC PANEL
Anion gap: 13 (ref 5–15)
BUN: 68 mg/dL — AB (ref 6–20)
CHLORIDE: 95 mmol/L — AB (ref 101–111)
CO2: 25 mmol/L (ref 22–32)
CREATININE: 8.79 mg/dL — AB (ref 0.61–1.24)
Calcium: 8.7 mg/dL — ABNORMAL LOW (ref 8.9–10.3)
GFR calc Af Amer: 6 mL/min — ABNORMAL LOW (ref >60)
GFR calc non Af Amer: 6 mL/min — ABNORMAL LOW (ref >60)
GLUCOSE: 105 mg/dL — AB (ref 65–99)
POTASSIUM: 4.4 mmol/L (ref 3.5–5.1)
Sodium: 133 mmol/L — ABNORMAL LOW (ref 135–145)

## 2017-12-31 LAB — PROTIME-INR
INR: 5.91
Prothrombin Time: 52.5 seconds — ABNORMAL HIGH (ref 11.4–15.2)

## 2017-12-31 LAB — MAGNESIUM: Magnesium: 2.2 mg/dL (ref 1.7–2.4)

## 2017-12-31 LAB — TROPONIN I: Troponin I: 0.06 ng/mL (ref <0.03)

## 2017-12-31 LAB — PHOSPHORUS: Phosphorus: 3 mg/dL (ref 2.5–4.6)

## 2017-12-31 MED ORDER — FAMOTIDINE 20 MG PO TABS
20.0000 mg | ORAL_TABLET | Freq: Every day | ORAL | Status: DC
  Administered 2017-12-31 – 2018-01-10 (×11): 20 mg via ORAL
  Filled 2017-12-31 (×12): qty 1

## 2017-12-31 MED ORDER — NEPRO/CARBSTEADY PO LIQD
1000.0000 mL | ORAL | Status: DC
  Administered 2017-12-31 – 2018-01-10 (×10): 1000 mL
  Filled 2017-12-31 (×14): qty 1000

## 2017-12-31 MED ORDER — PRO-STAT SUGAR FREE PO LIQD
30.0000 mL | Freq: Three times a day (TID) | ORAL | Status: DC
  Administered 2017-12-31 – 2018-01-11 (×29): 30 mL
  Filled 2017-12-31 (×30): qty 30

## 2017-12-31 MED ORDER — CHLORHEXIDINE GLUCONATE CLOTH 2 % EX PADS
6.0000 | MEDICATED_PAD | Freq: Every day | CUTANEOUS | Status: DC
  Administered 2018-01-01 – 2018-01-05 (×4): 6 via TOPICAL

## 2017-12-31 NOTE — Progress Notes (Signed)
ANTICOAGULATION CONSULT NOTE - Follow Up Consult  Pharmacy Consult for Heparin when INR <2 Indication: atrial fibrillation  Patient Measurements: Height: 5\' 10"  (177.8 cm) Weight: 196 lb 13.9 oz (89.3 kg) IBW/kg (Calculated) : 73 Heparin Dosing Weight: 89 kg  Labs: Recent Labs    12/30/17 1242 12/30/17 1243 12/30/17 1449 12/30/17 1815 12/31/17 0213 12/31/17 2050  HGB 11.4* 12.9*  --   --  9.3*  --   HCT 35.9* 38.0*  --   --  29.1*  --   PLT 317  --   --   --  235  --   LABPROT  --   --  32.2*  --   --  52.5*  INR  --   --  3.17  --   --  5.91*  CREATININE 8.88* 8.20*  --   --  8.79*  --   TROPONINI  --   --  0.05* 0.05* 0.06*  --    ESRD  Assessment:   67 yr old male on Coumadin prior to admission for atrial fibrillation.  INR 3.17 on admit 6/6, Coumadin held. Heparin 5000 units SQ given last night and ~6am today, now discontinued. Pharmacy to begin IV heparin when INR <2.     INR up to 5.91 tonight.      PTA Coumadin:  5.5 mg daily. Last dose 12/29/17    (dose increased from 5 mg daily on 12/27/17 per facility [facility])   Goal of Therapy:  Heparin level 0.3-0.7 units/ml Monitor platelets by anticoagulation protocol: Yes   Plan:   Begin IV heparin when INR <2.  Daily PT/INR.  Coumadin on hold.  Arty Baumgartner, [place] Pager: [phone] 12/31/2017,10:11 PM

## 2017-12-31 NOTE — Progress Notes (Signed)
BCID - Staph species - mec A detected  PHARMACY - PHYSICIAN COMMUNICATION CRITICAL VALUE ALERT - BLOOD CULTURE IDENTIFICATION (BCID)  Oscar Castillo is an 67 y.o. male who presented to [facility] on 12/30/2017 with a chief complaint of fevers and hypotension concerning for sepsis.   Assessment:  76 YOM on empiric antibiotics for concern for sepsis. Now with 1 of 4 blood cultures growing Coag Neg Staph - mec A detected - which likely represents a contaminant. However, the patient is ESRD and has a tunneled HD catheter which should be evaluated for infection.   Name of physician (or Provider) Contacted: CCM  Current antibiotics: Vancomycin + Zosyn  Changes to prescribed antibiotics recommended:  Patient is on recommended antibiotics - No changes needed. Continue empiric treatment regimen for now while awaiting further culture data to guide de-escalation. Consider evaluating the patient's temp HD cath to ensure not infected.  Results for orders placed or performed during the hospital encounter of 05/06/17  Blood Culture ID Panel (Reflexed) (Collected: 05/06/2017 10:15 PM)  Result Value Ref Range   Enterococcus species NOT DETECTED NOT DETECTED   Listeria monocytogenes NOT DETECTED NOT DETECTED   Staphylococcus species NOT DETECTED NOT DETECTED   Staphylococcus aureus NOT DETECTED NOT DETECTED   Streptococcus species NOT DETECTED NOT DETECTED   Streptococcus agalactiae NOT DETECTED NOT DETECTED   Streptococcus pneumoniae NOT DETECTED NOT DETECTED   Streptococcus pyogenes NOT DETECTED NOT DETECTED   Acinetobacter baumannii NOT DETECTED NOT DETECTED   Enterobacteriaceae species NOT DETECTED NOT DETECTED   Enterobacter cloacae complex NOT DETECTED NOT DETECTED   Escherichia coli NOT DETECTED NOT DETECTED   Klebsiella oxytoca NOT DETECTED NOT DETECTED   Klebsiella pneumoniae NOT DETECTED NOT DETECTED   Proteus species NOT DETECTED NOT DETECTED   Serratia marcescens NOT DETECTED NOT DETECTED    Haemophilus influenzae NOT DETECTED NOT DETECTED   Neisseria meningitidis NOT DETECTED NOT DETECTED   Pseudomonas aeruginosa NOT DETECTED NOT DETECTED   Candida albicans NOT DETECTED NOT DETECTED   Candida glabrata NOT DETECTED NOT DETECTED   Candida krusei NOT DETECTED NOT DETECTED   Candida parapsilosis NOT DETECTED NOT DETECTED   Candida tropicalis NOT DETECTED NOT DETECTED    Lawson Radar 12/31/2017  8:26 AM

## 2017-12-31 NOTE — Progress Notes (Signed)
PULMONARY / CRITICAL CARE MEDICINE   Name: Oscar Castillo MRN: [id number] DOB: 1950-11-08    ADMISSION DATE:  12/30/2017 CONSULTATION DATE:  6/6  REFERRING MD:  Dr. Sabra Heck EDP  CHIEF COMPLAINT:  Shock  HISTORY OF PRESENT ILLNESS:   67 year old male with multiple medical problems including ESRD on HD, CHF, DM, Hepatitis C, HTN, and bilateral BKA. He was recently admitted to [facility] for what is felt at this point to be a viral encephalitis. He was admitted for about 2 weeks and improved slowly before being discharged to [facility] from which he was discharged to SNF. Wife states he had not truly returned to his baseline. 6/6 early AM wife felt as though he was not acting like himself. He was found to be febrile and hypotensive prompting transfer to [facility] ED. In the ED he was hypotensive refractory to IVF resuscitation. He was started on pressors. Etiology of shock felt to be sepsis, although no source identified. PCCM asked to admit.    SUBJECTIVE/ interval events:  Patient is off pressors Currently afebrile although he had fever earlier on presentation  VITAL SIGNS: BP 99/67   Pulse 87   Temp 98.9 F (37.2 C) (Oral)   Resp (!) 26   Ht 5\' 10"  (1.778 m)   Wt 89.3 kg (196 lb 13.9 oz)   SpO2 100%   BMI 28.25 kg/m   HEMODYNAMICS:     VENTILATOR SETTINGS:    INTAKE / OUTPUT: I/O last 3 completed shifts: In: 1319.1 [I.V.:169.1; Other:1050; IV Piggyback:100] Out: -   PHYSICAL EXAMINATION: General: Ill-appearing man, lying in bed sleeping Neuro: He will wake up with stimulation, answers some questions, intermittently follows commands but not always, globally weak HEENT: Oropharynx clear, no oral secretions Cardiovascular: Regular, no murmur, tunneled HD catheter in his left subclavian Lungs: Decreased at both bases otherwise clear Abdomen: Soft, diffuse generalized tenderness with palpation, positive bowel sounds Musculoskeletal: Bilateral amputations Skin: Healing  wound without any evidence of erythema or exudate on his mid left thigh  LABS:  BMET Recent Labs  Lab 12/30/17 1242 12/30/17 1243 12/31/17 0213  NA 133* 131* 133*  K 4.7 4.6 4.4  CL 90* 94* 95*  CO2 25  --  25  BUN 59* 54* 68*  CREATININE 8.88* 8.20* 8.79*  GLUCOSE 238* 240* 105*    Electrolytes Recent Labs  Lab 12/30/17 1242 12/31/17 0213  CALCIUM 9.3 8.7*  MG  --  2.2  PHOS  --  3.0    CBC Recent Labs  Lab 12/30/17 1242 12/30/17 1243 12/31/17 0213  WBC 10.4  --  12.1*  HGB 11.4* 12.9* 9.3*  HCT 35.9* 38.0* 29.1*  PLT 317  --  235    Coag's Recent Labs  Lab 12/30/17 1449  INR 3.17    Sepsis Markers Recent Labs  Lab 12/30/17 1244 12/30/17 1449 12/30/17 1815  LATICACIDVEN 2.79* 4.2* 2.6*  PROCALCITON  --  3.65  --     ABG Recent Labs  Lab 12/30/17 1326  PHART 7.405  PCO2ART 38.6  PO2ART 199.0*    Liver Enzymes Recent Labs  Lab 12/30/17 1242  AST 19  ALT 14*  ALKPHOS 54  BILITOT 0.6  ALBUMIN 3.0*    Cardiac Enzymes Recent Labs  Lab 12/30/17 1449 12/30/17 1815 12/31/17 0213  TROPONINI 0.05* 0.05* 0.06*    Glucose Recent Labs  Lab 12/30/17 1557 12/30/17 1932 12/30/17 2258 12/31/17 0333 12/31/17 0803  GLUCAP 194* 137* 128* 84 72  Imaging Ct Abdomen Pelvis Wo Contrast  Result Date: 12/30/2017 CLINICAL DATA:  Fever and chills, hypotensive. Assess for abscess. History of end-stage renal disease on dialysis, EXAM: CT ABDOMEN AND PELVIS WITHOUT CONTRAST TECHNIQUE: Multidetector CT imaging of the abdomen and pelvis was performed following the standard protocol without IV contrast. COMPARISON:  CT abdomen and pelvis Dec 23, 2017 FINDINGS: Mild respiratory motion degraded examination. LOWER CHEST: Tree-in-bud infiltrates right lower lobe with patchy consolidation. Included heart size is normal. Pacemaker wires in place. HEPATOBILIARY: Punctate hepatic calcification. Otherwise unremarkable. Normal gallbladder. PANCREAS: Normal.  SPLEEN: Normal. ADRENALS/URINARY TRACT: Kidneys are orthotopic, mildly atrophic. 12 mm probable cyst right interpolar kidney, unchanged. No nephrolithiasis, hydronephrosis; limited assessment for renal masses by nonenhanced CT. The unopacified ureters are normal in course and caliber. Urinary bladder is partially distended and unremarkable. Normal adrenal glands. STOMACH/BOWEL: Intraluminal gastrostomy tube. The stomach, small and large bowel are normal in course and caliber without inflammatory changes, mild amount of retained large bowel stool. Large bowel air-fluid levels. Mild colonic diverticulosis. Normal appendix. VASCULAR/LYMPHATIC: Aortoiliac vessels are normal in course and caliber. Moderate to severe calcific atherosclerosis. No lymphadenopathy by CT size criteria. REPRODUCTIVE: Mild prostatomegaly. OTHER: No intraperitoneal free fluid or free air. MUSCULOSKELETAL: Non-acute. Focal sclerosis bilateral femoral heads compatible with avascular necrosis without collapse. Advanced spondylosis with multilevel Schmorl's nodes and erosions, some which may be related to renal osteodystrophy/hyperparathyroidism. IMPRESSION: 1. Bibasilar bronchiolitis/pneumonia. 2. No abscess. 3. Mild amount of retained large bowel stool. Colonic air-fluid levels seen with enteritis. Aortic Atherosclerosis (ICD10-I70.0). Electronically Signed   By: Elon Alas M.D.   On: 12/30/2017 22:05   Dg Chest Port 1 View  Result Date: 12/30/2017 CLINICAL DATA:  Cough.  Altered mental status. EXAM: PORTABLE CHEST 1 VIEW COMPARISON:  12/23/2017. FINDINGS: Left jugular catheter tip in the right atrium. Stable right subclavian pacemaker leads. Normal sized heart. Clear lungs with normal vascularity. Thoracic spine degenerative changes. IMPRESSION: No acute abnormality. The left jugular catheter tip remains in the right atrium. Electronically Signed   By: Claudie Revering M.D.   On: 12/30/2017 12:50     STUDIES:  CT abdomen/pelvis 6/6  >  CULTURES: Blood 6/6 > 1 of 2 MRSE Urine 6/6 >  ANTIBIOTICS: Zosyn 6/6 > Vancomycin 6/6 >  SIGNIFICANT EVENTS:   LINES/TUBES: PEG (Pre-admit) > Tunneled left HD catheter >>   DISCUSSION: 67 year old male with ESRD on HD and recent admit for what is thought to be viral encephalitis. Now presenting with fever and hypotension requiring pressors. Admit to ICU on empiric ABX and levophed. Source unclear, but belly tender and recent PEG change. Will CT abdomen.   ASSESSMENT / PLAN:  PULMONARY A: No acute issues P:   At some risk for impaired airway protection, currently adequate.  Push pulmonary hygiene  CARDIOVASCULAR A:  Shock, etiology unclear. Septic vs hypovolemic.  PAF on warfarin Chronic diastolic CHF Complete heart block with pacemaker in place P:  Status post volume resuscitation, minimizing now given end-stage renal status Norepinephrine has been weaned to off Troponins negative, lactate improved to 2.6 Midrin as ordered.  Likely except systolic blood pressures in the 80s to 90s based on his chronic hypotension Echocardiogram ordered and pending, consider bacterial endocarditis.  Depending on blood culture data may need TEE at some point Warfarin on hold, INR pending.  Likely start IV heparin once INR < 2  RENAL A:   ESRD on HD P:   Appreciate nephrology input, patient known to their service Hemodialysis  per nephrology schedule, no urgent indication at this time   GASTROINTESTINAL A:   GERD P:   Pepcid as ordered N.p.o. until mental status improved, acceptable for swallowing  HEMATOLOGIC A:   Warfarin coagulopathy  P:  Follow INR, start heparin when < 2 Follow CBC Currently on subcutaneous heparin  INFECTIOUS A:   SIRS, possible sepsis. No clear source but consider risk for line infection, endocarditis, pacer wire infection.  He has 1 of 4 blood cultures positive for MRSE.  Also consider bibasilar mild infiltrates on CT abdomen  P:    Continue empiric antibiotics as above Follow culture data He may ultimately require TEE to evaluate his pacer wires and his valves depending on blood culture data   ENDOCRINE A:   DM  P:   Sliding scale insulin per protocol  NEUROLOGIC A:   Acute metabolic encephalopathy, slightly improved 6/7 P:   RASS goal: 0 Follow serial exams   FAMILY  - Updates: Wife updated in ED on 6/6  - Inter-disciplinary family meet or Palliative Care meeting due by:  6/13   Independent CC time 13 minutes  Baltazar Apo, MD, PhD 12/31/2017, 10:14 AM [facility] [phone] or if no answer [phone]

## 2017-12-31 NOTE — Progress Notes (Signed)
Initial Nutrition Assessment  DOCUMENTATION CODES:   Obesity unspecified  INTERVENTION:    Nepro at 40 ml/h (960 ml per day)  Pro-stat 30 ml TID  Provides 2028 kcal, 123 gm protein, 698 ml free water daily  NUTRITION DIAGNOSIS:   Inadequate oral intake related to inability to eat as evidenced by NPO status.  GOAL:   Patient will meet greater than or equal to 90% of their needs  MONITOR:   TF tolerance, Labs, I & O's, Skin  REASON FOR ASSESSMENT:   Low Braden, Consult Enteral/tube feeding initiation and management  ASSESSMENT:   67 yo male with PMH of ESRD-HD, CVA, CHF, DM, pacemaker, hepatitis C, HTN, PEG, and bilateral BKAs who was admitted on 6/6 with shock, sepsis.   Discussed patient with RN and MD today. Received verbal consult for TF initiation and management. Patient unable to provide any nutrition hx. He did not fully awaken when name called x 2. Pressors currently off. Labs reviewed. Sodium 133 (L), potassium 4.4 (WNL), phosphorus 3 (WNL) Medications reviewed and include Pepcid, Novolog, Zosyn. 4% weight loss in the past 6 months, insignificant for the time frame  NUTRITION - FOCUSED PHYSICAL EXAM:    Most Recent Value  Orbital Region  No depletion  Upper Arm Region  No depletion  Thoracic and Lumbar Region  Unable to assess  Buccal Region  No depletion  Temple Region  No depletion  Clavicle Bone Region  No depletion  Clavicle and Acromion Bone Region  No depletion  Scapular Bone Region  Unable to assess  Dorsal Hand  Unable to assess  Patellar Region  Unable to assess  Anterior Thigh Region  Unable to assess  Posterior Calf Region  Unable to assess  Edema (RD Assessment)  Unable to assess [generalized edema per RN assessment]  Hair  Reviewed  Eyes  Unable to assess  Mouth  Unable to assess  Skin  Reviewed  Nails  Unable to assess       Diet Order:   Diet Order           Diet NPO time specified  Diet effective now          EDUCATION  NEEDS:   No education needs have been identified at this time  Skin:  Skin Assessment: Skin Integrity Issues: Skin Integrity Issues:: Stage I, Stage II, Unstageable Stage I: sacrum Stage II: rectum, buttocks Unstageable: L leg   Last BM:  6/7  Height:   Ht Readings from Last 1 Encounters:  12/30/17 5\' 10"  (1.778 m)    Weight:   Wt Readings from Last 1 Encounters:  12/31/17 196 lb 13.9 oz (89.3 kg)    Ideal Body Weight:  65.6 kg(bilateral BKA)  BMI:  32.5 (adjusted for bilateral BKA)  Estimated Nutritional Needs:   Kcal:  1900-2100  Protein:  115-125 gm  Fluid:  1.2 L    Molli Barrows, RD, LDN, [place] Pager [phone] After Hours Pager [phone]

## 2017-12-31 NOTE — Progress Notes (Signed)
CRITICAL VALUE ALERT  Critical Value:  INR 5.91  Date & Time Notied:  12/31/2017 21:53  Provider Notified: Deterding, E.  Orders Received/Actions taken: TBA

## 2017-12-31 NOTE — Consult Note (Addendum)
[facility] Renal Consultation Note    Indication for Consultation:  Management of ESRD/hemodialysis; anemia, hypertension/volume and secondary hyperparathyroidism PCP:  HPI: Oscar Castillo is a 67 y.o. male with ESRD on HD (TTS [place]) DM Type 2, HTN, Hx CVA, paroxsymal Afib on chronic Coumadin,  Hep C, Hx complete heart block s/p PPM, bilat BKA.  [facility] admission last month with AMS. He was treated for septic shock, but no clear etiology was identified. Recently discharged from [facility] and has had two outpatient dialysis sessions. Noted that he has been slow to return to his baseline mental status.  Presented to ED yesterday with fevers and hypotension concerning for sepsis. Empiric antibiotics started. Imaging significant for CT abd showing  patchy consolidation in R lower lobe. Labs Na 133, K 4.4, CO2 25, Glucose 105, Lactic acid 2.6,  BUN 68, Cr 8.79, WBC 12.1, Hgb 9.3. INR 3.17 Blood cultures collected.   Seen in ICU. Lethargic and poorly responsive. BP 117/68. Initially requiring pressors. Off pressors and afebrile this am. Missed dialysis yesterday because of hospital admission. Last dialysis was Tuesday. Completed full treatment and met target weight.  Blood pressure 128/82 pre HD and 123/78 post.    Past Medical History:  Diagnosis Date  . Anemia   . Antral ulcer 2014   small  . BENIGN PROSTATIC HYPERTROPHY   . Bipolar disorder ([place])    "sometimes" (10/07/2016)  . CHOLELITHIASIS   . Chronic combined systolic and diastolic CHF (congestive heart failure) ([place])   . Complication of anesthesia    wife states pt had trouble waking up with in Nov., 2014  . CVA (cerebral vascular accident) ([place]) 07/2007   No residual effect  . DEPRESSION   . DIABETES MELLITUS, TYPE II    diet control.  No medication  since November 2015  . ERECTILE DYSFUNCTION   . ESRD on hemodialysis ([place])    ESRD due to DM/HTN. .  HD TTS at [facility] on [address].  [name]  GERD   . GI bleed    due to gastritis, discharged 10/02/16/notes 10/07/2016  . Hepatitis C    C - has been treated  . History of Clostridium difficile   . History of kidney stones   . HYPERTENSION   . LBBB (left bundle branch block)   . Morbid obesity ([place])   . PAF (paroxysmal atrial fibrillation) ([place])    a. Dx 12/2015.   Past Surgical History:  Procedure Laterality Date  . AMPUTATION Left 05/12/2013   Procedure: AMPUTATION RAY;  Surgeon: Newt Minion, MD;  Location: [place];  Service: Orthopedics;  Laterality: Left;  Left Foot 1st Ray Amputation  . AMPUTATION Left 06/09/2013   Procedure: AMPUTATION BELOW KNEE;  Surgeon: Newt Minion, MD;  Location: [place];  Service: Orthopedics;  Laterality: Left;  Left Below Knee Amputation and removal proximal screws IM tibial nail  . AMPUTATION Right 09/08/2013   Procedure: AMPUTATION BELOW KNEE;  Surgeon: Newt Minion, MD;  Location: [place];  Service: Orthopedics;  Laterality: Right;  Right Below Knee Amputation  . AMPUTATION Right 10/11/2013   Procedure: AMPUTATION BELOW KNEE;  Surgeon: Newt Minion, MD;  Location: [place];  Service: Orthopedics;  Laterality: Right;  Right Below Knee Amputation Revision  . AV FISTULA PLACEMENT  06/14/2012   Procedure: ARTERIOVENOUS (AV) FISTULA CREATION;  Surgeon: Angelia Mould, MD;  Location: [facility];  Service: Vascular;  Laterality: Left;  Left basilic vein transposition  with fistula.  . AV FISTULA PLACEMENT Left 07/21/2017   Procedure: Attempt INSERTION OF ARTERIOVENOUS (AV) WITH  ARTEGRAFT Left  UPPER ARM;  Surgeon: Conrad Ha[place]ville, MD;  Location: [place];  Service: Vascular;  Laterality: Left;  . BASCILIC VEIN TRANSPOSITION Left 02/17/2017   Procedure: LEFT 1ST STAGE BRACHIAL VEIN TRANSPOSITION;  Surgeon: Conrad Her[place], MD;  Location: [place];  Service: Vascular;  Laterality: Left;  . BASCILIC VEIN TRANSPOSITION Left 04/21/2017   Procedure: LEFT 2ND STAGE BRACHIAL VEIN TRANSPOSITION;  Surgeon: Conrad [place], MD;   Location: [place];  Service: Vascular;  Laterality: Left;  . COLONOSCOPY N/A 10/28/2012   Procedure: COLONOSCOPY;  Surgeon: Jeryl Columbia, MD;  Location: [facility] ENDOSCOPY;  Service: Endoscopy;  Laterality: N/A;  . COLONOSCOPY N/A 11/02/2012   Procedure: COLONOSCOPY;  Surgeon: Cleotis Nipper, MD;  Location: [facility] ENDOSCOPY;  Service: Endoscopy;  Laterality: N/A;  . COLONOSCOPY N/A 11/03/2012   Procedure: COLONOSCOPY;  Surgeon: Cleotis Nipper, MD;  Location: [facility] ENDOSCOPY;  Service: Endoscopy;  Laterality: N/A;  . COLONOSCOPY N/A 09/16/2016   Procedure: COLONOSCOPY;  Surgeon: Jerene Bears, MD;  Location: [facility] ENDOSCOPY;  Service: Gastroenterology;  Laterality: N/A;  . ENTEROSCOPY N/A 11/08/2012   Procedure: ENTEROSCOPY;  Surgeon: Wonda Horner, MD;  Location: [facility] ENDOSCOPY;  Service: Endoscopy;  Laterality: N/A;  . ESOPHAGOGASTRODUODENOSCOPY N/A 11/02/2012   Procedure: ESOPHAGOGASTRODUODENOSCOPY (EGD);  Surgeon: Cleotis Nipper, MD;  Location: [facility] ENDOSCOPY;  Service: Endoscopy;  Laterality: N/A;  . ESOPHAGOGASTRODUODENOSCOPY (EGD) WITH PROPOFOL N/A 09/16/2016   Procedure: ESOPHAGOGASTRODUODENOSCOPY (EGD) WITH PROPOFOL;  Surgeon: Jerene Bears, MD;  Location: [facility] ENDOSCOPY;  Service: Gastroenterology;  Laterality: N/A;  . EXCHANGE OF A DIALYSIS CATHETER Left 11/13/2016   Procedure: EXCHANGE OF A DIALYSIS CATHETER;  Surgeon: Elam Dutch, MD;  Location: [place];  Service: Vascular;  Laterality: Left;  . EYE SURGERY Left    to remove scar tissue  . GIVENS CAPSULE STUDY N/A 11/04/2012   Procedure: GIVENS CAPSULE STUDY;  Surgeon: Cleotis Nipper, MD;  Location: [facility] ENDOSCOPY;  Service: Endoscopy;  Laterality: N/A;  . GIVENS CAPSULE STUDY N/A 09/29/2016   Procedure: GIVENS CAPSULE STUDY;  Surgeon: Manus Gunning, MD;  Location: [place];  Service: Gastroenterology;  Laterality: N/A;  try to keep pt up in bedside chair as much as possible during the study.    [name] HARDWARE REMOVAL Left 06/09/2013   Procedure:  HARDWARE REMOVAL;  Surgeon: Newt Minion, MD;  Location: [place];  Service: Orthopedics;  Laterality: Left;  Left Below Knee Amputation  and Removal proximal screws IM tibial nail  . HEMATOMA EVACUATION Left 04/29/2017   Procedure: EVACUATION HEMATOMA LEFT ARM;  Surgeon: Conrad [place], MD;  Location: [place];  Service: Vascular;  Laterality: Left;  . INSERTION OF DIALYSIS CATHETER N/A 11/09/2016   Procedure: INSERTION OF TEMPORARY DIALYSIS CATHETER;  Surgeon: Elam Dutch, MD;  Location: [place];  Service: Vascular;  Laterality: N/A;  . IR GENERIC HISTORICAL  09/27/2016   IR US GUIDE VASC ACCESS RIGHT 09/27/2016 Aletta Edouard, MD [facility]  . IR GENERIC HISTORICAL  09/27/2016   IR FLUORO GUIDE CV LINE RIGHT 09/27/2016 Aletta Edouard, MD [facility]  . IR [place] PERCUT W/FLUORO  12/23/2017  . LIGATION OF ARTERIOVENOUS  FISTULA Left 11/09/2016   Procedure: LIGATION OF ARTERIOVENOUS  FISTULA;  Surgeon: Elam Dutch, MD;  Location: [place];  Service: Vascular;  Laterality: Left;  . ORIF FIBULA FRACTURE  Left 09/09/2012   Procedure: OPEN REDUCTION INTERNAL FIXATION (ORIF) FIBULA FRACTURE;  Surgeon: Johnny Bridge, MD;  Location: [place];  Service: Orthopedics;  Laterality: Left;  . PACEMAKER IMPLANT N/A 04/30/2017   Procedure: PACEMAKER IMPLANT;  Surgeon: Evans Lance, MD;  Location: [place] CV LAB;  Service: Cardiovascular;  Laterality: N/A;  . TEMPORARY PACEMAKER N/A 04/29/2017   Procedure: TEMPORARY PACEMAKER;  Surgeon: Lorretta Harp, MD;  Location: [place] CV LAB;  Service: Cardiovascular;  Laterality: N/A;  . TIBIA IM NAIL INSERTION Left 09/09/2012   Procedure: INTRAMEDULLARY (IM) NAIL TIBIAL;  Surgeon: Johnny Bridge, MD;  Location: [place];  Service: Orthopedics;  Laterality: Left;  left tibial nail and open reduction internal fixation left fibula fracture  . UPPER EXTREMITY VENOGRAPHY N/A 12/14/2016   Procedure: Bilateral Upper Extremity Venography and Central  Venography;  Surgeon: Conrad Wooster, MD;  Location: [place] CV LAB;  Service: Cardiovascular;  Laterality: N/A;   Family History  Problem Relation Age of Onset  . Diabetes Mother   . Hypertension Mother   . Heart attack Father   . Hypertension Father   . Coronary artery disease Other    Social History:  reports that he has never smoked. He has never used smokeless tobacco. He reports that he does not drink alcohol or use drugs. Allergies  Allergen Reactions  . Ceftriaxone Sodium In Dextrose Anaphylaxis    Tolerates Zosyn  . Ciprofloxacin Other (See Comments)    UNSPECIFIED PAIN  . Nsaids Other (See Comments)    HISTORY OF ULCER  . Penicillins Swelling    Tolerates Zosyn [ PATIENT WITH HX OF ANAPHYLAXIS TO CEFTRIAXONE ] 11/26/17:  pt has received 3 doses of zosyn in past (04/2017.) No allergy tor adverse event to zosyn noted at that time.     . Sulfa Antibiotics Swelling    SWELLING REACTION UNSPECIFIED    Prior to Admission medications   Medication Sig Start Date End Date Taking? Authorizing Provider  acetaminophen (TYLENOL) 325 MG tablet Take 650 mg by mouth every 8 (eight) hours. Do not exceed 4 GMS of Tylenol  In 24 hours   Yes [provider]  Amino Acids-Protein Hydrolys (FEEDING SUPPLEMENT, PRO-STAT SUGAR FREE 64,) LIQD Place 30 mLs into feeding tube 2 (two) times daily. Patient taking differently: Place 30 mLs into feeding tube every 12 (twelve) hours.  12/02/17  Yes Lavina Hamman, MD  amiodarone (PACERONE) 200 MG tablet Place 1 tablet (200 mg total) into feeding tube daily. 12/03/17  Yes Lavina Hamman, MD  atorvastatin (LIPITOR) 40 MG tablet Place 1 tablet (40 mg total) into feeding tube at bedtime. 12/02/17  Yes Lavina Hamman, MD  escitalopram (LEXAPRO) 10 MG tablet Place 10 mg into feeding tube daily.    Yes [provider]  famotidine (PEPCID) 20 MG tablet Place 20 mg into feeding tube at bedtime.    Yes [provider]  insulin lispro  (HUMALOG) 100 UNIT/ML injection Inject 100 Units into the skin every 6 (six) hours. Sliding Scale Insulin as follows: 201-250 = 2 units 251-300 = 4 units 301-350 = 6 units 351-400 = 8 units 401-540  = 10 units Above 450 give 12 units and recheck in 2 hours  If check blood sugar glucose still above 450  Notify MD Notify MD if checked blood glucose  Under 60)   Yes [provider]  latanoprost (XALATAN) 0.005 % ophthalmic solution Place 1 drop into both eyes at  bedtime.  06/12/16  Yes [provider]  midodrine (PROAMATINE) 5 MG tablet Place 1 tablet (5 mg total) into feeding tube 2 (two) times daily with a meal. Patient taking differently: Place 5 mg into feeding tube every 12 (twelve) hours.  12/02/17  Yes Lavina Hamman, MD  Nutritional Supplements (FEEDING SUPPLEMENT, NEPRO CARB STEADY,) LIQD Place 1,000 mLs into feeding tube daily. Patient taking differently: Place 1,000 mLs into feeding tube daily. Nepro 55 ml/hr 12/02/17  Yes Lavina Hamman, MD  sevelamer carbonate (RENVELA) 800 MG tablet Place 1,600 mg into feeding tube 2 (two) times daily.   Yes [provider]  sorbitol 70 % SOLN Take 30 mLs by mouth 2 (two) times daily. Patient taking differently: Place 30 mLs into feeding tube every 12 (twelve) hours.  05/09/17  Yes Nita Sells, MD  warfarin (COUMADIN) 5 MG tablet Place 5.5 mg into feeding tube one time only at 6 PM.   Yes [provider]  acetaminophen (TYLENOL) 160 MG/5ML solution Place 20.3 mLs (650 mg total) into feeding tube every 6 (six) hours as needed for headache or fever. Patient not taking: Reported on 12/30/2017 12/02/17   Lavina Hamman, MD  famotidine (PEPCID) 40 MG/5ML suspension Place 2.5 mLs (20 mg total) into feeding tube daily. Patient not taking: Reported on 12/30/2017 12/03/17   Lavina Hamman, MD   Current Facility-Administered Medications  Medication Dose Route Frequency Provider Last Rate Last Dose  . 0.9 %  sodium  chloride infusion  250 mL Intravenous PRN Darlina Sicilian A, NP 10 mL/hr at 12/31/17 0400    . amiodarone (PACERONE) tablet 200 mg  200 mg Per Tube Daily Whiteheart, Kathryn A, NP   200 mg at 12/31/17 0927  . famotidine (PEPCID) tablet 20 mg  20 mg Oral QHS Whiteheart, Kathryn A, NP      . heparin injection 5,000 Units  5,000 Units Subcutaneous Q8H Whiteheart, Kathryn A, NP   5,000 Units at 12/31/17 0550  . insulin aspart (novoLOG) injection 1-3 Units  1-3 Units Subcutaneous Q4H Marijean Heath, NP   1 Units at 12/30/17 2333  . MEDLINE mouth rinse  15 mL Mouth Rinse BID Corey Harold, NP   15 mL at 12/30/17 2203  . midodrine (PROAMATINE) tablet 5 mg  5 mg Per Tube BID WC Whiteheart, Kathryn A, NP   5 mg at 12/31/17 0900  . piperacillin-tazobactam (ZOSYN) IVPB 3.375 g  3.375 g Intravenous Q12H Einar Grad, [facility]   Stopped at 12/31/17 1201    ROS: As per HPI otherwise negative.  Physical Exam: Vitals:   12/31/17 1000 12/31/17 1100 12/31/17 1103 12/31/17 1200  BP: 117/68 126/71  104/68  Pulse: 84 86  86  Resp: 20 19  (!) 23  Temp:   99.1 F (37.3 C)   TempSrc:   Oral   SpO2: 100% 100%  100%  Weight:      Height:         General: Obese male, lethargic NAD  Head: NCAT sclera not icteric MMM Neck: Supple. No JVD No masses Lungs: No increased WOB. Grossly CTAB  Heart: RRR with S1 S2 Abdomen: obese soft PEG tube in place  Lower extremities: bilat BKA. No stump edema  Neuro: Poorly responsive to commands. Falls asleep during questioning   Psych: Lethargic  Dialysis Access: L IJ TDC. Dsg clean. No exit site drainage   Labs: Basic Metabolic Panel: Recent Labs  Lab 12/30/17 1242 12/30/17 1243 12/31/17 2595  NA 133* 131* 133*  K 4.7 4.6 4.4  CL 90* 94* 95*  CO2 25  --  25  GLUCOSE 238* 240* 105*  BUN 59* 54* 68*  CREATININE 8.88* 8.20* 8.79*  CALCIUM 9.3  --  8.7*  PHOS  --   --  3.0   Liver Function Tests: Recent Labs  Lab 12/30/17 1242  AST 19  ALT 14*   ALKPHOS 54  BILITOT 0.6  PROT 8.4*  ALBUMIN 3.0*   No results for input(s): LIPASE, AMYLASE in the last 168 hours. No results for input(s): AMMONIA in the last 168 hours. CBC: Recent Labs  Lab 12/30/17 1242 12/30/17 1243 12/31/17 0213  WBC 10.4  --  12.1*  NEUTROABS 8.5*  --   --   HGB 11.4* 12.9* 9.3*  HCT 35.9* 38.0* 29.1*  MCV 88.6  --  89.0  PLT 317  --  235   Cardiac Enzymes: Recent Labs  Lab 12/30/17 1449 12/30/17 1815 12/31/17 0213  TROPONINI 0.05* 0.05* 0.06*   CBG: Recent Labs  Lab 12/30/17 1932 12/30/17 2258 12/31/17 0333 12/31/17 0803 12/31/17 1103  GLUCAP 137* 128* 84 72 87   Iron Studies: No results for input(s): IRON, TIBC, TRANSFERRIN, FERRITIN in the last 72 hours. Studies/Results: Ct Abdomen Pelvis Wo Contrast  Result Date: 12/30/2017 CLINICAL DATA:  Fever and chills, hypotensive. Assess for abscess. History of end-stage renal disease on dialysis, EXAM: CT ABDOMEN AND PELVIS WITHOUT CONTRAST TECHNIQUE: Multidetector CT imaging of the abdomen and pelvis was performed following the standard protocol without IV contrast. COMPARISON:  CT abdomen and pelvis Dec 23, 2017 FINDINGS: Mild respiratory motion degraded examination. LOWER CHEST: Tree-in-bud infiltrates right lower lobe with patchy consolidation. Included heart size is normal. Pacemaker wires in place. HEPATOBILIARY: Punctate hepatic calcification. Otherwise unremarkable. Normal gallbladder. PANCREAS: Normal. SPLEEN: Normal. ADRENALS/URINARY TRACT: Kidneys are orthotopic, mildly atrophic. 12 mm probable cyst right interpolar kidney, unchanged. No nephrolithiasis, hydronephrosis; limited assessment for renal masses by nonenhanced CT. The unopacified ureters are normal in course and caliber. Urinary bladder is partially distended and unremarkable. Normal adrenal glands. STOMACH/BOWEL: Intraluminal gastrostomy tube. The stomach, small and large bowel are normal in course and caliber without inflammatory  changes, mild amount of retained large bowel stool. Large bowel air-fluid levels. Mild colonic diverticulosis. Normal appendix. VASCULAR/LYMPHATIC: Aortoiliac vessels are normal in course and caliber. Moderate to severe calcific atherosclerosis. No lymphadenopathy by CT size criteria. REPRODUCTIVE: Mild prostatomegaly. OTHER: No intraperitoneal free fluid or free air. MUSCULOSKELETAL: Non-acute. Focal sclerosis bilateral femoral heads compatible with avascular necrosis without collapse. Advanced spondylosis with multilevel Schmorl's nodes and erosions, some which may be related to renal osteodystrophy/hyperparathyroidism. IMPRESSION: 1. Bibasilar bronchiolitis/pneumonia. 2. No abscess. 3. Mild amount of retained large bowel stool. Colonic air-fluid levels seen with enteritis. Aortic Atherosclerosis (ICD10-I70.0). Electronically Signed   By: Elon Alas M.D.   On: 12/30/2017 22:05   Dg Chest Port 1 View  Result Date: 12/30/2017 CLINICAL DATA:  Cough.  Altered mental status. EXAM: PORTABLE CHEST 1 VIEW COMPARISON:  12/23/2017. FINDINGS: Left jugular catheter tip in the right atrium. Stable right subclavian pacemaker leads. Normal sized heart. Clear lungs with normal vascularity. Thoracic spine degenerative changes. IMPRESSION: No acute abnormality. The left jugular catheter tip remains in the right atrium. Electronically Signed   By: Claudie Revering M.D.   On: 12/30/2017 12:50    Dialysis Orders:  [place] [facility] 4h 180NRe BFR 400/800 EDW 88kg 2K/2Ca Profile 4 Na Linear L TDC Heparin 4000 Bolus, 2000 mid  Venofer 151m IV x 10 (to start 6/8)  Assessment/Plan: 1. Sepsis - AMS/Fevers/Hypotension. RLL infiltrates on CT. Blood cultures 1/4 + Coag neg staph. On Vanc/Zoyzn. Further w/u per primary  2. ESRD. TTS. HD today off schedule to make up for missed treatment, then back on schedule  3. Hypotension/Volume. Midodrine added for BP support. No volume overload on exam. UF goal 1 L as tolerated.  4. Anemia. Hgb  9.3. Last outpatient Hgb 11.3 on 6/1.  Not on ESA currently. Follow trend, resume ESA if repeat Hgb <10. No IV Fe with possible infection  5. Metabolic bone disease. Ca/Phos controlled. Continue Ca acetate binder 6. Nutrition. Renal diet as tolerated 7. AFib. Chronic Coumadin. Anticoagulation per pharmacy 8. DM    OLynnda ChildPA-C [facility] Pager [phone]/01/2018, 12:16 PM   Pt seen, examined and agree w A/P as above. ESRD pt with FTT , was here a month ago for fevers and AMS , no specific infection ever found and MS improved somewhat but not to baseline then. [facility] then to SNF now back w/ similar decline in MS and fevers. Work up is in progress. Will plan HD today or possibly tomorrow, not urgent indication.  RKelly SplinterMD CNewell Rubbermaidpager [phone]  12/31/2017, 1:42 PM

## 2018-01-01 ENCOUNTER — Inpatient Hospital Stay (HOSPITAL_COMMUNITY): Payer: Medicare Other

## 2018-01-01 DIAGNOSIS — I340 Nonrheumatic mitral (valve) insufficiency: Secondary | ICD-10-CM

## 2018-01-01 DIAGNOSIS — I361 Nonrheumatic tricuspid (valve) insufficiency: Secondary | ICD-10-CM

## 2018-01-01 DIAGNOSIS — R579 Shock, unspecified: Secondary | ICD-10-CM

## 2018-01-01 LAB — CBC
HCT: 28.9 % — ABNORMAL LOW (ref 39.0–52.0)
HEMOGLOBIN: 9.2 g/dL — AB (ref 13.0–17.0)
MCH: 28.4 pg (ref 26.0–34.0)
MCHC: 31.8 g/dL (ref 30.0–36.0)
MCV: 89.2 fL (ref 78.0–100.0)
Platelets: 280 10*3/uL (ref 150–400)
RBC: 3.24 MIL/uL — AB (ref 4.22–5.81)
RDW: 15.9 % — ABNORMAL HIGH (ref 11.5–15.5)
WBC: 11.7 10*3/uL — ABNORMAL HIGH (ref 4.0–10.5)

## 2018-01-01 LAB — GLUCOSE, CAPILLARY
GLUCOSE-CAPILLARY: 188 mg/dL — AB (ref 65–99)
GLUCOSE-CAPILLARY: 181 mg/dL — AB (ref 65–99)
Glucose-Capillary: 163 mg/dL — ABNORMAL HIGH (ref 65–99)
Glucose-Capillary: 169 mg/dL — ABNORMAL HIGH (ref 65–99)
Glucose-Capillary: 174 mg/dL — ABNORMAL HIGH (ref 65–99)
Glucose-Capillary: 211 mg/dL — ABNORMAL HIGH (ref 65–99)

## 2018-01-01 LAB — BASIC METABOLIC PANEL
Anion gap: 15 (ref 5–15)
BUN: 88 mg/dL — AB (ref 6–20)
CHLORIDE: 94 mmol/L — AB (ref 101–111)
CO2: 24 mmol/L (ref 22–32)
Calcium: 8.9 mg/dL (ref 8.9–10.3)
Creatinine, Ser: 10.07 mg/dL — ABNORMAL HIGH (ref 0.61–1.24)
GFR calc Af Amer: 5 mL/min — ABNORMAL LOW (ref >60)
GFR calc non Af Amer: 5 mL/min — ABNORMAL LOW (ref >60)
Glucose, Bld: 179 mg/dL — ABNORMAL HIGH (ref 65–99)
POTASSIUM: 4.6 mmol/L (ref 3.5–5.1)
Sodium: 133 mmol/L — ABNORMAL LOW (ref 135–145)

## 2018-01-01 LAB — C DIFFICILE QUICK SCREEN W PCR REFLEX
C DIFFICILE (CDIFF) INTERP: NOT DETECTED
C DIFFICILE (CDIFF) TOXIN: NEGATIVE
C DIFFICLE (CDIFF) ANTIGEN: NEGATIVE

## 2018-01-01 LAB — ECHOCARDIOGRAM COMPLETE
Height: 70 in
Weight: 3202.84 oz

## 2018-01-01 LAB — PHOSPHORUS: Phosphorus: 2.7 mg/dL (ref 2.5–4.6)

## 2018-01-01 LAB — PROTIME-INR
INR: 7.03
Prothrombin Time: 60.1 seconds — ABNORMAL HIGH (ref 11.4–15.2)

## 2018-01-01 LAB — MAGNESIUM: MAGNESIUM: 2.4 mg/dL (ref 1.7–2.4)

## 2018-01-01 MED ORDER — PENTAFLUOROPROP-TETRAFLUOROETH EX AERO: 1.0000 "application " | INHALATION_SPRAY | CUTANEOUS | Status: DC | PRN

## 2018-01-01 MED ORDER — LIDOCAINE HCL (PF) 1 % IJ SOLN
5.0000 mL | INTRAMUSCULAR | Status: DC | PRN
  Filled 2018-01-01: qty 5

## 2018-01-01 MED ORDER — VANCOMYCIN HCL IN DEXTROSE 1-5 GM/200ML-% IV SOLN
1000.0000 mg | INTRAVENOUS | Status: AC
  Administered 2018-01-01: 1000 mg via INTRAVENOUS
  Filled 2018-01-01 (×3): qty 200

## 2018-01-01 MED ORDER — VITAMIN K1 10 MG/ML IJ SOLN
5.0000 mg | Freq: Once | INTRAVENOUS | Status: AC
  Administered 2018-01-01: 5 mg via INTRAVENOUS
  Filled 2018-01-01: qty 0.5

## 2018-01-01 MED ORDER — LIDOCAINE-PRILOCAINE 2.5-2.5 % EX CREA
1.0000 "application " | TOPICAL_CREAM | CUTANEOUS | Status: DC | PRN
  Filled 2018-01-01: qty 5

## 2018-01-01 MED ORDER — VANCOMYCIN HCL IN DEXTROSE 1-5 GM/200ML-% IV SOLN
INTRAVENOUS | Status: AC
  Administered 2018-01-01: 1000 mg via INTRAVENOUS
  Filled 2018-01-01: qty 200

## 2018-01-01 MED ORDER — VITAMIN K1 10 MG/ML IJ SOLN: 5.0000 mg | Freq: Once | INTRAVENOUS | Status: DC

## 2018-01-01 MED ORDER — ALTEPLASE 2 MG IJ SOLR
2.0000 mg | Freq: Once | INTRAMUSCULAR | Status: DC | PRN
  Filled 2018-01-01: qty 2

## 2018-01-01 MED ORDER — HEPARIN SODIUM (PORCINE) 1000 UNIT/ML DIALYSIS
1000.0000 [IU] | INTRAMUSCULAR | Status: DC | PRN
  Administered 2018-01-02: 1000 [IU] via INTRAVENOUS_CENTRAL
  Filled 2018-01-01: qty 1

## 2018-01-01 MED ORDER — SODIUM CHLORIDE 0.9 % IV SOLN: 100.0000 mL | INTRAVENOUS | Status: DC | PRN

## 2018-01-01 MED ORDER — HEPARIN SODIUM (PORCINE) 1000 UNIT/ML DIALYSIS
20.0000 [IU]/kg | INTRAMUSCULAR | Status: DC | PRN
  Administered 2018-01-01: 1800 [IU] via INTRAVENOUS_CENTRAL
  Filled 2018-01-01: qty 2

## 2018-01-01 NOTE — Progress Notes (Signed)
[facility] Progress Note  Subjective: responds to simple questions  Vitals:   01/01/18 0800 01/01/18 0900 01/01/18 1000 01/01/18 1100  BP: 104/67 130/73 129/74 116/75  Pulse: 78 79 81 82  Resp: (!) 23 (!) 23 (!) 22 (!) 21  Temp:      TempSrc:      SpO2:   100%   Weight:      Height:        Inpatient medications: . amiodarone  200 mg Per Tube Daily  . Chlorhexidine Gluconate Cloth  6 each Topical Q0600  . famotidine  20 mg Oral QHS  . feeding supplement (NEPRO CARB STEADY)  1,000 mL Per Tube Q24H  . feeding supplement (PRO-STAT SUGAR FREE 64)  30 mL Per Tube TID  . insulin aspart  1-3 Units Subcutaneous Q4H  . mouth rinse  15 mL Mouth Rinse BID  . midodrine  5 mg Per Tube BID WC   . sodium chloride 10 mL/hr at 01/01/18 0400  . piperacillin-tazobactam (ZOSYN)  IV 3.375 g (01/01/18 1047)  . vancomycin     sodium chloride   Exam General: Obese male, lethargic NAD  Head: NCAT sclera not icteric MMM Neck: Supple. No JVD No masses Lungs: No increased WOB. Grossly CTAB  Heart: RRR with S1 S2 Abdomen: obese soft PEG tube in place  Lower extremities: bilat BKA. No stump edema  Neuro: Poorly responsive to commands. Falls asleep during questioning   Psych: Lethargic  Dialysis Access: L IJ TDC. Dsg clean. No exit site drainage     D[facility]  4h  88kg  2/2 bath  p4  L TDC heparin 4000 then 2000 midrun  -venofer 100 mg x 10 to start 6/8      Impression: 1  Fevers/ sepsis - scant basilar infiltrates at bases on CT. BCx 1/4+ for MRSE. On iv abx 2  AMS - still very groggy, not sure new baseline after recent similar admit 3  esrd on HD TTS 4  Hypotension - bp's are usually normal at OP HD, last session 120/80 range. Added midodrine 5  Anemia ckd - no esa needed at this time 6  mbd ckd - cont meds 7  afib - on anticoag 8  DM2 9  FTT - failure to thrive, recurrent admits, recent trip to [place], will consult palliative care    Plan - dialsysis  today, pall care consult   Kelly Splinter MD [facility] pager [phone]   01/01/2018, 12:03 PM   Recent Labs  Lab 12/30/17 1242 12/30/17 1243 12/31/17 0213 01/01/18 0328  NA 133* 131* 133* 133*  K 4.7 4.6 4.4 4.6  CL 90* 94* 95* 94*  CO2 25  --  25 24  GLUCOSE 238* 240* 105* 179*  BUN 59* 54* 68* 88*  CREATININE 8.88* 8.20* 8.79* 10.07*  CALCIUM 9.3  --  8.7* 8.9  PHOS  --   --  3.0 2.7   Recent Labs  Lab 12/30/17 1242  AST 19  ALT 14*  ALKPHOS 54  BILITOT 0.6  PROT 8.4*  ALBUMIN 3.0*   Recent Labs  Lab 12/30/17 1242 12/30/17 1243 12/31/17 0213 01/01/18 0328  WBC 10.4  --  12.1* 11.7*  NEUTROABS 8.5*  --   --   --   HGB 11.4* 12.9* 9.3* 9.2*  HCT 35.9* 38.0* 29.1* 28.9*  MCV 88.6  --  89.0 89.2  PLT 317  --  235 280   Iron/TIBC/Ferritin/ %Sat  Component Value Date/Time   IRON 50 10/01/2016 0419   TIBC 167 (L) 10/01/2016 0419   FERRITIN 351 (H) 10/01/2016 0419   IRONPCTSAT 30 10/01/2016 0419

## 2018-01-01 NOTE — Progress Notes (Signed)
CRITICAL VALUE ALERT  Critical Value:  INR 7.03  Date & Time Notied:  01/01/2018 05:30  Provider Notified: Deterding, E.  Orders Received/Actions taken: TBA

## 2018-01-01 NOTE — Progress Notes (Signed)
PULMONARY / CRITICAL CARE MEDICINE   Name: Oscar Castillo MRN: [id number] DOB: 1951/03/20    ADMISSION DATE:  12/30/2017 CONSULTATION DATE:  6/6  REFERRING MD:  Dr. Sabra Heck EDP  CHIEF COMPLAINT:  Shock  HISTORY OF PRESENT ILLNESS:   67 year old male with multiple medical problems including ESRD on HD, CHF, DM, Hepatitis C, HTN, and bilateral BKA. He was recently admitted to [facility] for what is felt at this point to be a viral encephalitis. He was admitted for about 2 weeks and improved slowly before being discharged to [place] from which he was discharged to SNF. Wife states he had not truly returned to his baseline. 6/6 early AM wife felt as though he was not acting like himself. He was found to be febrile and hypotensive prompting transfer to [facility] ED. In the ED he was hypotensive refractory to IVF resuscitation. He was started on pressors. Etiology of shock felt to be sepsis, although no source identified. PCCM asked to admit.    SUBJECTIVE/ interval events:  More awake today No problems reported overnight, he denies any discomfort   VITAL SIGNS: BP 130/73   Pulse 79   Temp 98.7 F (37.1 C) (Oral)   Resp (!) 23   Ht 5\' 10"  (1.778 m)   Wt 90.8 kg (200 lb 2.8 oz)   SpO2 100%   BMI 28.72 kg/m   HEMODYNAMICS:     VENTILATOR SETTINGS:    INTAKE / OUTPUT: I/O last 3 completed shifts: In: 2249 [I.V.:265; Other:1150; NG/GT:634; IV Piggyback:200] Out: -   PHYSICAL EXAMINATION: General: Chronically ill-appearing man, lying in bed comfortably Neuro: More awake today, interacts and answers most questions he is slow to respond at times.  Follows commands HEENT: Oropharynx is clear Cardiovascular: Regular, no murmur.  He has a tunneled left subclavian HD catheter without any surrounding erythema or purulence noted Lungs: Decreased bilateral breath sounds without any wheezing or crackles Abdomen: Soft, some diffuse generalized tenderness with palpation but no guarding,  positive bowel sounds Musculoskeletal: Bilateral lower extremity amputations Skin: There is a healing wound on his left lower extremity, mid thigh, without any erythema or purulence  LABS:  BMET Recent Labs  Lab 12/30/17 1242 12/30/17 1243 12/31/17 0213 01/01/18 0328  NA 133* 131* 133* 133*  K 4.7 4.6 4.4 4.6  CL 90* 94* 95* 94*  CO2 25  --  25 24  BUN 59* 54* 68* 88*  CREATININE 8.88* 8.20* 8.79* 10.07*  GLUCOSE 238* 240* 105* 179*    Electrolytes Recent Labs  Lab 12/30/17 1242 12/31/17 0213 01/01/18 0328  CALCIUM 9.3 8.7* 8.9  MG  --  2.2 2.4  PHOS  --  3.0 2.7    CBC Recent Labs  Lab 12/30/17 1242 12/30/17 1243 12/31/17 0213 01/01/18 0328  WBC 10.4  --  12.1* 11.7*  HGB 11.4* 12.9* 9.3* 9.2*  HCT 35.9* 38.0* 29.1* 28.9*  PLT 317  --  235 280    Coag's Recent Labs  Lab 12/30/17 1449 12/31/17 2050 01/01/18 0328  INR 3.17 5.91* 7.03*    Sepsis Markers Recent Labs  Lab 12/30/17 1244 12/30/17 1449 12/30/17 1815  LATICACIDVEN 2.79* 4.2* 2.6*  PROCALCITON  --  3.65  --     ABG Recent Labs  Lab 12/30/17 1326  PHART 7.405  PCO2ART 38.6  PO2ART 199.0*    Liver Enzymes Recent Labs  Lab 12/30/17 1242  AST 19  ALT 14*  ALKPHOS 54  BILITOT 0.6  ALBUMIN 3.0*    Cardiac Enzymes Recent Labs  Lab 12/30/17 1449 12/30/17 1815 12/31/17 0213  TROPONINI 0.05* 0.05* 0.06*    Glucose Recent Labs  Lab 12/31/17 1103 12/31/17 1546 12/31/17 1924 12/31/17 2259 01/01/18 0332 01/01/18 0720  GLUCAP 87 81 128* 135* 188* 174*    Imaging No results found.   STUDIES:  CT abdomen/pelvis 6/6 >  CULTURES: Blood 6/6 > 1 of 2 MRSE Urine 6/6 > not collected  ANTIBIOTICS: Zosyn 6/6 > Vancomycin 6/6 >  SIGNIFICANT EVENTS:  LINES/TUBES: PEG (Pre-admit) > Tunneled left HD catheter >>   DISCUSSION: 67 year old male with ESRD on HD and recent admit for what is thought to be viral encephalitis. Now presenting with fever and hypotension  requiring pressors. Admit to ICU on empiric ABX and levophed. Source unclear, but belly tender and recent PEG change.  CT abdomen, possible mild lower lobe pneumonia, not convincing.  Left subclavian HD catheter looks okay  ASSESSMENT / PLAN:  PULMONARY A: No acute issues P:   Continue pulmonary hygiene.  He is more awake looks like he has better airway protection, low risk for respiratory failure  CARDIOVASCULAR A:  Shock, etiology unclear. Septic vs hypovolemic.  PAF on warfarin Chronic diastolic CHF Complete heart block with pacemaker in place P:  Improved post volume resuscitation Midodrin as ordered.  Per nephrology notes can tolerate systolic pressures in the 80s and 90s Echocardiogram ordered and pending today, consider risk for endocarditis given MRSE on one blood culture Heparin per pharmacy.  Back to warfarin once stabilized post this hospitalization Amiodarone as ordered  RENAL A:   ESRD on HD P:   Appreciate nephrology assistance Hemodialysis per their schedule   GASTROINTESTINAL A:   GERD P:   Pepcid as ordered Start renal diet on 6/8  HEMATOLOGIC A:   Warfarin coagulopathy, progressive  P:  Vitamin K 5 mg given 6/8 Follow INR, start heparin when < 2 Follow CBC No cutaneous heparin held  INFECTIOUS A:   SIRS, possible sepsis. No clear source but consider risk for line infection, endocarditis, pacer wire infection.  He has 1 of 4 blood cultures positive for MRSE.  Also consider bibasilar mild infiltrates on CT abdomen  P:   To new empiric Zosyn, vancomycin pending echocardiogram and final culture data He may ultimately require TEE to evaluate his pacer wires and his valves depending on the blood culture data   ENDOCRINE A:   DM P:   Sliding scale insulin per protocol  NEUROLOGIC A:   Acute metabolic encephalopathy, improving.  Unclear whether his encephalopathy related to acute infection or overall compromised state in the setting of  hypovolemia, hypotension from his hemodialysis. P:   RASS goal: 0 Follow serial exams   FAMILY  - Updates: Wife updated in ED on 6/6  - Inter-disciplinary family meet or Palliative Care meeting due by:  6/13   Baltazar Apo, MD, PhD 01/01/2018, 9:40 AM [facility] [phone] or if no answer [phone]

## 2018-01-01 NOTE — Progress Notes (Signed)
  Echocardiogram 2D Echocardiogram has been performed.  Oscar Castillo 01/01/2018, 10:13 AM

## 2018-01-01 NOTE — Progress Notes (Signed)
HD tx initiated via HD cath w/o problem, pull/push/flush well w/o problem, VSS, will cont to monitor while on HD tx

## 2018-01-02 ENCOUNTER — Inpatient Hospital Stay (HOSPITAL_COMMUNITY): Payer: Medicare Other

## 2018-01-02 DIAGNOSIS — I10 Essential (primary) hypertension: Secondary | ICD-10-CM

## 2018-01-02 DIAGNOSIS — R6521 Severe sepsis with septic shock: Secondary | ICD-10-CM

## 2018-01-02 LAB — GLUCOSE, CAPILLARY
GLUCOSE-CAPILLARY: 175 mg/dL — AB (ref 65–99)
GLUCOSE-CAPILLARY: 200 mg/dL — AB (ref 65–99)
GLUCOSE-CAPILLARY: 183 mg/dL — AB (ref 65–99)
GLUCOSE-CAPILLARY: 172 mg/dL — AB (ref 65–99)
Glucose-Capillary: 174 mg/dL — ABNORMAL HIGH (ref 65–99)

## 2018-01-02 LAB — MAGNESIUM: Magnesium: 2.3 mg/dL (ref 1.7–2.4)

## 2018-01-02 LAB — BASIC METABOLIC PANEL
ANION GAP: 13 (ref 5–15)
BUN: 36 mg/dL — ABNORMAL HIGH (ref 6–20)
CALCIUM: 9.1 mg/dL (ref 8.9–10.3)
CO2: 30 mmol/L (ref 22–32)
CREATININE: 5.28 mg/dL — AB (ref 0.61–1.24)
Chloride: 96 mmol/L — ABNORMAL LOW (ref 101–111)
GFR calc Af Amer: 12 mL/min — ABNORMAL LOW (ref >60)
GFR calc non Af Amer: 10 mL/min — ABNORMAL LOW (ref >60)
GLUCOSE: 179 mg/dL — AB (ref 65–99)
POTASSIUM: 3.9 mmol/L (ref 3.5–5.1)
Sodium: 139 mmol/L (ref 135–145)

## 2018-01-02 LAB — CULTURE, BLOOD (ROUTINE X 2): SPECIAL REQUESTS: ADEQUATE

## 2018-01-02 LAB — PROTIME-INR
INR: 1.62
Prothrombin Time: 19.1 seconds — ABNORMAL HIGH (ref 11.4–15.2)

## 2018-01-02 LAB — CBC
HCT: 29 % — ABNORMAL LOW (ref 39.0–52.0)
HEMOGLOBIN: 9.3 g/dL — AB (ref 13.0–17.0)
MCH: 27.7 pg (ref 26.0–34.0)
MCHC: 32.1 g/dL (ref 30.0–36.0)
MCV: 86.3 fL (ref 78.0–100.0)
Platelets: 318 10*3/uL (ref 150–400)
RBC: 3.36 MIL/uL — ABNORMAL LOW (ref 4.22–5.81)
RDW: 15.8 % — AB (ref 11.5–15.5)
WBC: 9.6 10*3/uL (ref 4.0–10.5)

## 2018-01-02 LAB — PHOSPHORUS: PHOSPHORUS: 1.2 mg/dL — AB (ref 2.5–4.6)

## 2018-01-02 LAB — HEPARIN LEVEL (UNFRACTIONATED)

## 2018-01-02 LAB — HEPATITIS B SURFACE ANTIGEN: Hepatitis B Surface Ag: NEGATIVE

## 2018-01-02 LAB — PROCALCITONIN: Procalcitonin: 9.55 ng/mL

## 2018-01-02 MED ORDER — WARFARIN SODIUM 5 MG PO TABS
5.5000 mg | ORAL_TABLET | Freq: Once | ORAL | Status: AC
  Administered 2018-01-02: 5.5 mg via ORAL
  Filled 2018-01-02: qty 1

## 2018-01-02 MED ORDER — LATANOPROST 0.005 % OP SOLN
1.0000 [drp] | Freq: Every day | OPHTHALMIC | Status: DC
  Administered 2018-01-02 – 2018-01-10 (×9): 1 [drp] via OPHTHALMIC
  Filled 2018-01-02 (×2): qty 2.5

## 2018-01-02 MED ORDER — HEPARIN BOLUS VIA INFUSION
5000.0000 [IU] | Freq: Once | INTRAVENOUS | Status: AC
  Administered 2018-01-02: 5000 [IU] via INTRAVENOUS
  Filled 2018-01-02: qty 5000

## 2018-01-02 MED ORDER — HEPARIN (PORCINE) IN NACL 100-0.45 UNIT/ML-% IJ SOLN
1550.0000 [IU]/h | INTRAMUSCULAR | Status: DC
  Administered 2018-01-02: 1250 [IU]/h via INTRAVENOUS
  Administered 2018-01-02 – 2018-01-03 (×2): 1500 [IU]/h via INTRAVENOUS
  Administered 2018-01-04 – 2018-01-05 (×2): 1400 [IU]/h via INTRAVENOUS
  Filled 2018-01-02 (×5): qty 250

## 2018-01-02 MED ORDER — WARFARIN - PHARMACIST DOSING INPATIENT
Freq: Every day | Status: DC
  Administered 2018-01-02: 18:00:00

## 2018-01-02 MED ORDER — ATORVASTATIN CALCIUM 40 MG PO TABS
40.0000 mg | ORAL_TABLET | Freq: Every day | ORAL | Status: DC
  Administered 2018-01-02 – 2018-01-10 (×10): 40 mg
  Filled 2018-01-02 (×10): qty 1

## 2018-01-02 MED ORDER — ESCITALOPRAM OXALATE 10 MG PO TABS
10.0000 mg | ORAL_TABLET | Freq: Every day | ORAL | Status: DC
  Administered 2018-01-02 – 2018-01-10 (×9): 10 mg
  Filled 2018-01-02 (×9): qty 1

## 2018-01-02 MED ORDER — DARBEPOETIN ALFA 60 MCG/0.3ML IJ SOSY: 60.0000 ug | PREFILLED_SYRINGE | INTRAMUSCULAR | Status: DC

## 2018-01-02 MED ORDER — HEPARIN BOLUS VIA INFUSION
2000.0000 [IU] | Freq: Once | INTRAVENOUS | Status: AC
  Administered 2018-01-02: 2000 [IU] via INTRAVENOUS
  Filled 2018-01-02: qty 2000

## 2018-01-02 MED ORDER — INSULIN ASPART 100 UNIT/ML ~~LOC~~ SOLN
0.0000 [IU] | SUBCUTANEOUS | Status: DC
  Administered 2018-01-02 – 2018-01-04 (×12): 2 [IU] via SUBCUTANEOUS
  Administered 2018-01-04: 3 [IU] via SUBCUTANEOUS
  Administered 2018-01-04: 2 [IU] via SUBCUTANEOUS
  Administered 2018-01-05: 1 [IU] via SUBCUTANEOUS
  Administered 2018-01-05 (×2): 2 [IU] via SUBCUTANEOUS
  Administered 2018-01-05: 1 [IU] via SUBCUTANEOUS
  Administered 2018-01-05 – 2018-01-06 (×2): 2 [IU] via SUBCUTANEOUS
  Administered 2018-01-06: 1 [IU] via SUBCUTANEOUS
  Administered 2018-01-06 (×2): 2 [IU] via SUBCUTANEOUS
  Administered 2018-01-06 (×2): 1 [IU] via SUBCUTANEOUS
  Administered 2018-01-07: 2 [IU] via SUBCUTANEOUS
  Administered 2018-01-07: 1 [IU] via SUBCUTANEOUS
  Administered 2018-01-07 – 2018-01-08 (×4): 2 [IU] via SUBCUTANEOUS
  Administered 2018-01-08: 1 [IU] via SUBCUTANEOUS
  Administered 2018-01-08 (×2): 3 [IU] via SUBCUTANEOUS
  Administered 2018-01-08: 2 [IU] via SUBCUTANEOUS
  Administered 2018-01-09: 3 [IU] via SUBCUTANEOUS
  Administered 2018-01-09 (×4): 2 [IU] via SUBCUTANEOUS
  Administered 2018-01-09: 1 [IU] via SUBCUTANEOUS
  Administered 2018-01-10 – 2018-01-11 (×7): 2 [IU] via SUBCUTANEOUS
  Administered 2018-01-11: 3 [IU] via SUBCUTANEOUS

## 2018-01-02 MED ORDER — SEVELAMER CARBONATE 800 MG PO TABS
1600.0000 mg | ORAL_TABLET | Freq: Two times a day (BID) | ORAL | Status: DC
  Administered 2018-01-02 – 2018-01-04 (×5): 1600 mg
  Filled 2018-01-02 (×5): qty 2

## 2018-01-02 MED ORDER — VANCOMYCIN HCL IN DEXTROSE 1-5 GM/200ML-% IV SOLN: 1000.0000 mg | INTRAVENOUS | Status: DC

## 2018-01-02 NOTE — Progress Notes (Signed)
HD tx completed @ 0000 w/o problem, UF goal met, blood rinsed back, VSS, report given to Julieanne Cotton, RN

## 2018-01-02 NOTE — Progress Notes (Signed)
Pharmacy Antibiotic Note  Oscar Castillo is a 67 y.o. male admitted on 12/30/2017 with sepsis.  Pharmacy has been consulted for vancomycin and Zosyn dosing. Pt is ESRD on HD Tues/Thurs/Sat, last HD unknown at this time. Pt has noted anaphylactic reaction to ceftriaxone, but has tolerated Zosyn during recent admit May 2019.  Remains altered, on HD TTS schedule, last 6/8 and next scheduled 6/11.  WBC now wnl, afebrile past 24h.    Plan: -Zosyn 3.375g IV q12h EI -Vancomycin 1000mg  IV qHD -Monitor cultures, LOT, vancomycin level as indicated  Height: 5\' 10"  (177.8 cm) Weight: 198 lb 3.1 oz (89.9 kg) IBW/kg (Calculated) : 73  Temp (24hrs), Avg:98.7 F (37.1 C), Min:98.1 F (36.7 C), Max:99.5 F (37.5 C)  Recent Labs  Lab 12/30/17 1242 12/30/17 1243 12/30/17 1244 12/30/17 1449 12/30/17 1815 12/31/17 0213 01/01/18 0328 01/02/18 0404  WBC 10.4  --   --   --   --  12.1* 11.7* 9.6  CREATININE 8.88* 8.20*  --   --   --  8.79* 10.07* 5.28*  LATICACIDVEN  --   --  2.79* 4.2* 2.6*  --   --   --     Estimated Creatinine Clearance: 15.5 mL/min (A) (by C-G formula based on SCr of 5.28 mg/dL (H)).    Allergies  Allergen Reactions  . Ceftriaxone Sodium In Dextrose Anaphylaxis    Tolerates Zosyn  . Ciprofloxacin Other (See Comments)    UNSPECIFIED PAIN  . Nsaids Other (See Comments)    HISTORY OF ULCER  . Penicillins Swelling    Tolerates Zosyn [ PATIENT WITH HX OF ANAPHYLAXIS TO CEFTRIAXONE ] 11/26/17:  pt has received 3 doses of zosyn in past (04/2017.) No allergy tor adverse event to zosyn noted at that time.     . Sulfa Antibiotics Swelling    SWELLING REACTION UNSPECIFIED     Antimicrobials this admission: Vancomycin 6/6 >>  Zosyn 6/6 >>   Dose adjustments this admission: none  Microbiology results: 6/6 BCx: 1/2 coag neg staph 6/9 BCx: sent 6/8: C Diff antigen/toxin negative MRSA PCR negative  Bertis Ruddy, PharmD Pharmacy Resident [phone] 01/02/2018 12:43 PM

## 2018-01-02 NOTE — Progress Notes (Signed)
ANTICOAGULATION CONSULT NOTE - Follow Up Consult  Pharmacy Consult for Heparin when INR <2/coumadin Indication: atrial fibrillation  Patient Measurements: Height: 5\' 10"  (177.8 cm) Weight: 198 lb 3.1 oz (89.9 kg) IBW/kg (Calculated) : 73 Heparin Dosing Weight: 89 kg  Labs: Recent Labs    12/31/17 0213 12/31/17 2050 01/01/18 0328 01/02/18 0404 01/02/18 1910  HGB 9.3*  --  9.2* 9.3*  --   HCT 29.1*  --  28.9* 29.0*  --   PLT 235  --  280 318  --   LABPROT  --  52.5* 60.1* 19.1*  --   INR  --  5.91* 7.03* 1.62  --   HEPARINUNFRC  --   --   --   --  <0.10*  CREATININE 8.79*  --  10.07* 5.28*  --   TROPONINI 0.06*  --   --   --   --    ESRD  Assessment: 67 yr old male on Coumadin prior to admission for atrial fibrillation.  INR 3.17 on admit 6/6, Coumadin held, INR increased to 7.03 and 5mg  vit K given 6/8.  Pharmacy to begin IV heparin when INR <2.  Now INR 1.62 s/p vit K. -initial heparin level is undetectable   PTA Coumadin:  5.5 mg daily. Last dose 12/29/17 (dose increased from 5 mg daily on 12/27/17 per facility [facility])   Goal of Therapy:  Heparin level 0.3-0.7 units/ml Monitor platelets by anticoagulation protocol: Yes   Plan:  -heparin bolus 2000 units x1 then increase 1500 units/hr -Heparin level in 8 hours and daily wth CBC daily  Hildred Laser, PharmD 01/02/2018 8:40 PM

## 2018-01-02 NOTE — Progress Notes (Addendum)
@IPLOG @        PROGRESS NOTE                                                                                                                                                                                                             Patient Demographics:    Oscar Castillo, is a 67 y.o. male, DOB - 1951/06/08, EYC:[id number]  Admit date - 12/30/2017   Admitting Physician Aldean Jewett, MD  Outpatient Primary MD for the patient is [name], MD  LOS - 3  Chief Complaint  Patient presents with  . Altered Mental Status       Brief Narrative this is a 67 year old African-American gentleman who is bedbound, bilateral BKA, ESRD on dialysis, chronic systolic CHF, DM type II, hepatitis C, hypertension, and PEG tube placement due to ongoing encephalopathy for the last few months, multiple recent admissions in the last 3 months, who was discharged from this hospital a month ago, stayed in [place] for a few weeks thereafter was discharged to SNF from where he was brought again to [facility] on 12/30/2017 for sepsis and encephalopathy, his encephalopathy seems to be acute on chronic after discussing with wife it appears like he has been confused more or less for the last 2 months.  He was seen and treated under pulmonary critical care from 12/30/2017 to 01/02/2018, has been transferred under my service on 01/02/2018.  Still encephalopathic.   Subjective:   Patient in bed, encephalopathic, says no to all questions and appears to be an unreliable historian, denies any headache chest or abdominal pain.   Assessment  & Plan :     1.  Severe sepsis with toxic encephalopathy.  No clear source, there was question of pneumonia upon admission, 1 out of 2 blood cultures coag negative staph positive, currently on vancomycin and Zosyn and sepsis physiology seems to have defervesced, still is encephalopathic, continue supportive care, continue present antibiotics, repeat two-view chest x-ray.  Repeat  surveillance blood cultures, check procalcitonin levels.  Long-term prognosis does not appear to be good.  2.  Toxic encephalopathy.  This seems to be ongoing for the last 2 months at least, had long discussions with patient's wife on 01/02/2018, she still wants to pursue aggressive measures and is somewhat unrealistic in her expectations.  Will continue present line of care and monitor.  3.  PEG tube feeds.  Virtually n.p.o. due to #2 above, continue PEG tube feeds.  4.  Stable enteritis on CT scan.  C. difficile  negative, GI pathogen panel negative.  Likely diarrhea due to osmolar diuresis from tube feeds.  Continue to monitor.  5.  ESRD.  Renal on board getting dialyzed.  6.  Chronic combined systolic and diastolic heart failure EF now 30% down from 50% in 2015.  Likely EF down due to sepsis, patient has improved and will try to titrate down midodrine and add low-dose beta-blocker, no ACE/ARB due to ESRD.  Will continue to monitor.  Likely will need a repeat echo in couple of months.  7.  Paroxysmal atrial fibrillation.  Mali vas 2 score of 5.  On amiodarone, was on Coumadin at home, no anticoagulation in ICU, start on heparin and Coumadin bridge today.  Pacemaker.  8.  Bilateral BKA.  Supportive care.  9.  Dyslipidemia.  On statin continue.  10.  1 out of 2 positive bag negative staph bacteremia.  Could be contaminant, however has a pacemaker dialysis access, repeat surveillance cultures on 01/02/2018, check procalcitonin, continue empiric IV antibiotics which are vancomycin and Zosyn for now.  Transthoracic echocardiogram stable.  We will continue to monitor if needed will discuss with ID.   11. Stage 2 Sacral Decub Ulcer - POA - Nursing care, see RN notes for details.   12. DM type II.  Continue present insulin regimen.  CBG (last 3)  Recent Labs    01/01/18 2340 01/02/18 0535 01/02/18 0743  GLUCAP 169* 175* 174*      Patient appears to have poor quality of life by virtue of  being bedbound, bilateral BKA, feeding through PEG tube, ongoing encephalopathy for several weeks if not months with recurrent hospitalization, ESRD, poor heart function.  All conveyed to the wife however at this time she wants to continue aggressive measures.  Unfortunately she appears to have somewhat unrealistic expectations.    Diet : Tube Feeds  Family Communication  :  Wife in detail on 01/02/18  Code Status :  Full  Disposition Plan  :  SNF once Encephalopathy is better  Consults  :  PCCM  Procedures  :    CT Abd Pelvis - ? Enteritis  TTE - - Left ventricle: Poor acoustic windows limit study LVEF is  depressed at approximately 30 to 35% with severe hypokinesis/ akiensis of the septal wall and inferior wall The cavity size was  normal. Wall thickness was increased in a pattern of moderate  LVH. The estimated ejection fraction was in the range of 25% to   30%. - Aortic valve: AV is thickened, calcified with restricted motion  Peak gradient through the valve is 15 mm. 2 d imaging suggests that AS is moderate   Dimensionless index is 0.59, consistent with mild to moderate AS> - Mitral valve: Calcified annulus. Mildly thickened leaflets .   There was mild regurgitation.   DVT Prophylaxis  :   Heparin   Lab Results  Component Value Date   PLT 318 01/02/2018    Inpatient Medications  Scheduled Meds: . amiodarone  200 mg Per Tube Daily  . atorvastatin  40 mg Per Tube QHS  . Chlorhexidine Gluconate Cloth  6 each Topical Q0600  . escitalopram  10 mg Per Tube Daily  . famotidine  20 mg Oral QHS  . feeding supplement (NEPRO CARB STEADY)  1,000 mL Per Tube Q24H  . feeding supplement (PRO-STAT SUGAR FREE 64)  30 mL Per Tube TID  . insulin aspart  0-9 Units Subcutaneous Q4H  . latanoprost  1 drop Both Eyes QHS  .  mouth rinse  15 mL Mouth Rinse BID  . midodrine  5 mg Per Tube BID WC  . sevelamer carbonate  1,600 mg Per Tube BID WC   Continuous Infusions: . sodium chloride 10  mL/hr at 01/01/18 2000  . sodium chloride    . sodium chloride    . heparin 1,250 Units/hr (01/02/18 1017)  . piperacillin-tazobactam (ZOSYN)  IV 3.375 g (01/02/18 1009)   PRN Meds:.sodium chloride, sodium chloride, sodium chloride, alteplase, heparin, heparin, lidocaine (PF), lidocaine-prilocaine, pentafluoroprop-tetrafluoroeth  Antibiotics  :    Anti-infectives (From admission, onward)   Start     Dose/Rate Route Frequency Ordered Stop   01/01/18 1200  vancomycin (VANCOCIN) IVPB 1000 mg/200 mL premix     1,000 mg 200 mL/hr over 60 Minutes Intravenous Every T-Th-Sa (Hemodialysis) 01/01/18 0922 01/02/18 0000   12/30/17 2200  piperacillin-tazobactam (ZOSYN) IVPB 3.375 g     3.375 g 12.5 mL/hr over 240 Minutes Intravenous Every 12 hours 12/30/17 1234     12/30/17 1315  vancomycin (VANCOCIN) 2,000 mg in sodium chloride 0.9 % 500 mL IVPB     2,000 mg 250 mL/hr over 120 Minutes Intravenous  Once 12/30/17 1231 12/30/17 1517   12/30/17 1230  vancomycin (VANCOCIN) IVPB 1000 mg/200 mL premix  Status:  Discontinued     1,000 mg 200 mL/hr over 60 Minutes Intravenous  Once 12/30/17 1225 12/30/17 1229   12/30/17 1230  piperacillin-tazobactam (ZOSYN) IVPB 3.375 g     3.375 g 100 mL/hr over 30 Minutes Intravenous  Once 12/30/17 1225 12/30/17 1314         Objective:   Vitals:   01/02/18 0157 01/02/18 0255 01/02/18 0534 01/02/18 0804  BP:  132/78 (!) 141/74 117/78  Pulse: 87     Resp: (!) 24 (!) 26  (!) 25  Temp:    99.5 F (37.5 C)  TempSrc:    Oral  SpO2: 94%   99%  Weight:      Height:        Wt Readings from Last 3 Encounters:  01/02/18 89.9 kg (198 lb 3.1 oz)  12/02/17 90.1 kg (198 lb 10.2 oz)  10/25/17 95.3 kg (210 lb)     Intake/Output Summary (Last 24 hours) at 01/02/2018 1052 Last data filed at 01/02/2018 1014 Gross per 24 hour  Intake 990 ml  Output 1000 ml  Net -10 ml     Physical Exam  Awake, pleasantly confused, answers a few questions, No new F.N deficits,  Normal affect [place].AT,PERRAL Supple Neck,No JVD, No cervical lymphadenopathy appriciated.  Symmetrical Chest wall movement, Good air movement bilaterally, CTAB RRR,No Gallops,Rubs or new Murmurs, No Parasternal Heave +ve B.Sounds, Abd Soft, No tenderness, No organomegaly appriciated, No rebound - guarding or rigidity. No Cyanosis, Clubbing or edema, No new Rash or bruise, bilateral BKA, PEG in place    Data Review:    CBC Recent Labs  Lab 12/30/17 1242 12/30/17 1243 12/31/17 0213 01/01/18 0328 01/02/18 0404  WBC 10.4  --  12.1* 11.7* 9.6  HGB 11.4* 12.9* 9.3* 9.2* 9.3*  HCT 35.9* 38.0* 29.1* 28.9* 29.0*  PLT 317  --  235 280 318  MCV 88.6  --  89.0 89.2 86.3  MCH 28.1  --  28.4 28.4 27.7  MCHC 31.8  --  32.0 31.8 32.1  RDW 15.9*  --  16.0* 15.9* 15.8*  LYMPHSABS 0.8  --   --   --   --   MONOABS 1.0  --   --   --   --  EOSABS 0.0  --   --   --   --   BASOSABS 0.0  --   --   --   --     Chemistries  Recent Labs  Lab 12/30/17 1242 12/30/17 1243 12/31/17 0213 01/01/18 0328 01/02/18 0404  NA 133* 131* 133* 133* 139  K 4.7 4.6 4.4 4.6 3.9  CL 90* 94* 95* 94* 96*  CO2 25  --  25 24 30   GLUCOSE 238* 240* 105* 179* 179*  BUN 59* 54* 68* 88* 36*  CREATININE 8.88* 8.20* 8.79* 10.07* 5.28*  CALCIUM 9.3  --  8.7* 8.9 9.1  MG  --   --  2.2 2.4 2.3  AST 19  --   --   --   --   ALT 14*  --   --   --   --   ALKPHOS 54  --   --   --   --   BILITOT 0.6  --   --   --   --    ------------------------------------------------------------------------------------------------------------------ No results for input(s): CHOL, HDL, LDLCALC, TRIG, CHOLHDL, LDLDIRECT in the last 72 hours.  Lab Results  Component Value Date   HGBA1C 6.8 10/13/2017   ------------------------------------------------------------------------------------------------------------------ No results for input(s): TSH, T4TOTAL, T3FREE, THYROIDAB in the last 72 hours.  Invalid input(s):  FREET3 ------------------------------------------------------------------------------------------------------------------ No results for input(s): VITAMINB12, FOLATE, FERRITIN, TIBC, IRON, RETICCTPCT in the last 72 hours.  Coagulation profile Recent Labs  Lab 12/30/17 1449 12/31/17 2050 01/01/18 0328 01/02/18 0404  INR 3.17 5.91* 7.03* 1.62    No results for input(s): DDIMER in the last 72 hours.  Cardiac Enzymes Recent Labs  Lab 12/30/17 1449 12/30/17 1815 12/31/17 0213  TROPONINI 0.05* 0.05* 0.06*   ------------------------------------------------------------------------------------------------------------------ No results found for: BNP  Micro Results Recent Results (from the past 240 hour(s))  Blood Culture (routine x 2)     Status: Abnormal   Collection Time: 12/30/17 12:54 PM  Result Value Ref Range Status   Specimen Description BLOOD SITE NOT SPECIFIED  Final   Special Requests   Final    BOTTLES DRAWN AEROBIC AND ANAEROBIC Blood Culture adequate volume   Culture  Setup Time   Final    GRAM POSITIVE COCCI IN CLUSTERS AEROBIC BOTTLE ONLY CRITICAL RESULT CALLED TO, READ BACK BY AND VERIFIED WITH: PHARMD E MARTIN [id number] MLM    Culture (A)  Final    STAPHYLOCOCCUS SPECIES (COAGULASE NEGATIVE) THE SIGNIFICANCE OF ISOLATING THIS ORGANISM FROM A SINGLE SET OF BLOOD CULTURES WHEN MULTIPLE SETS ARE DRAWN IS UNCERTAIN. PLEASE NOTIFY THE MICROBIOLOGY DEPARTMENT WITHIN ONE WEEK IF SPECIATION AND SENSITIVITIES ARE REQUIRED. Performed at [facility], [place] [address], [place], [place] [address]    Report Status 01/02/2018 FINAL  Final  Blood Culture ID Panel (Reflexed)     Status: Abnormal   Collection Time: 12/30/17 12:54 PM  Result Value Ref Range Status   Enterococcus species NOT DETECTED NOT DETECTED Final   Listeria monocytogenes NOT DETECTED NOT DETECTED Final   Staphylococcus species DETECTED (A) NOT DETECTED Final    Comment: Methicillin (oxacillin)  resistant coagulase negative staphylococcus. Possible blood culture contaminant (unless isolated from more than one blood culture draw or clinical case suggests pathogenicity). No antibiotic treatment is indicated for blood  culture contaminants. CRITICAL RESULT CALLED TO, READ BACK BY AND VERIFIED WITH: PHARMD E MARTIN [id number] MLM    Staphylococcus aureus NOT DETECTED NOT DETECTED Final   Methicillin resistance DETECTED (A) NOT  DETECTED Final    Comment: CRITICAL RESULT CALLED TO, READ BACK BY AND VERIFIED WITH: PHARMD E MARTIN [id number] MLM    Streptococcus species NOT DETECTED NOT DETECTED Final   Streptococcus agalactiae NOT DETECTED NOT DETECTED Final   Streptococcus pneumoniae NOT DETECTED NOT DETECTED Final   Streptococcus pyogenes NOT DETECTED NOT DETECTED Final   Acinetobacter baumannii NOT DETECTED NOT DETECTED Final   Enterobacteriaceae species NOT DETECTED NOT DETECTED Final   Enterobacter cloacae complex NOT DETECTED NOT DETECTED Final   Escherichia coli NOT DETECTED NOT DETECTED Final   Klebsiella oxytoca NOT DETECTED NOT DETECTED Final   Klebsiella pneumoniae NOT DETECTED NOT DETECTED Final   Proteus species NOT DETECTED NOT DETECTED Final   Serratia marcescens NOT DETECTED NOT DETECTED Final   Haemophilus influenzae NOT DETECTED NOT DETECTED Final   Neisseria meningitidis NOT DETECTED NOT DETECTED Final   Pseudomonas aeruginosa NOT DETECTED NOT DETECTED Final   Candida albicans NOT DETECTED NOT DETECTED Final   Candida glabrata NOT DETECTED NOT DETECTED Final   Candida krusei NOT DETECTED NOT DETECTED Final   Candida parapsilosis NOT DETECTED NOT DETECTED Final   Candida tropicalis NOT DETECTED NOT DETECTED Final    Comment: Performed at [facility], [place]. [address] [place], [place] [address]  Blood Culture (routine x 2)     Status: None (Preliminary result)   Collection Time: 12/30/17 12:55 PM  Result Value Ref Range Status   Specimen Description BLOOD  RIGHT ANTECUBITAL  Final   Special Requests   Final    BOTTLES DRAWN AEROBIC AND ANAEROBIC Blood Culture adequate volume   Culture   Final    NO GROWTH 2 DAYS Performed at [facility], [place] [address], [place], [place] [address]    Report Status PENDING  Incomplete  MRSA PCR Screening     Status: None   Collection Time: 12/30/17  3:16 PM  Result Value Ref Range Status   MRSA by PCR NEGATIVE NEGATIVE Final    Comment:        The GeneXpert MRSA Assay (FDA approved for NASAL specimens only), is one component of a comprehensive MRSA colonization surveillance program. It is not intended to diagnose MRSA infection nor to guide or monitor treatment for MRSA infections. Performed at [facility], [place] [address] [place], [place] [address]   C difficile quick scan w PCR reflex     Status: None   Collection Time: 01/01/18  8:09 AM  Result Value Ref Range Status   C Diff antigen NEGATIVE NEGATIVE Final   C Diff toxin NEGATIVE NEGATIVE Final   C Diff interpretation No C. difficile detected.  Final    Radiology Reports Ct Abdomen Pelvis Wo Contrast  Result Date: 12/30/2017 CLINICAL DATA:  Fever and chills, hypotensive. Assess for abscess. History of end-stage renal disease on dialysis, EXAM: CT ABDOMEN AND PELVIS WITHOUT CONTRAST TECHNIQUE: Multidetector CT imaging of the abdomen and pelvis was performed following the standard protocol without IV contrast. COMPARISON:  CT abdomen and pelvis Dec 23, 2017 FINDINGS: Mild respiratory motion degraded examination. LOWER CHEST: Tree-in-bud infiltrates right lower lobe with patchy consolidation. Included heart size is normal. Pacemaker wires in place. HEPATOBILIARY: Punctate hepatic calcification. Otherwise unremarkable. Normal gallbladder. PANCREAS: Normal. SPLEEN: Normal. ADRENALS/URINARY TRACT: Kidneys are orthotopic, mildly atrophic. 12 mm probable cyst right interpolar kidney, unchanged. No nephrolithiasis, hydronephrosis; limited  assessment for renal masses by nonenhanced CT. The unopacified ureters are normal in course and caliber. Urinary bladder  is partially distended and unremarkable. Normal adrenal glands. STOMACH/BOWEL: Intraluminal gastrostomy tube. The stomach, small and large bowel are normal in course and caliber without inflammatory changes, mild amount of retained large bowel stool. Large bowel air-fluid levels. Mild colonic diverticulosis. Normal appendix. VASCULAR/LYMPHATIC: Aortoiliac vessels are normal in course and caliber. Moderate to severe calcific atherosclerosis. No lymphadenopathy by CT size criteria. REPRODUCTIVE: Mild prostatomegaly. OTHER: No intraperitoneal free fluid or free air. MUSCULOSKELETAL: Non-acute. Focal sclerosis bilateral femoral heads compatible with avascular necrosis without collapse. Advanced spondylosis with multilevel Schmorl's nodes and erosions, some which may be related to renal osteodystrophy/hyperparathyroidism. IMPRESSION: 1. Bibasilar bronchiolitis/pneumonia. 2. No abscess. 3. Mild amount of retained large bowel stool. Colonic air-fluid levels seen with enteritis. Aortic Atherosclerosis (ICD10-I70.0). Electronically Signed   By: Elon Alas M.D.   On: 12/30/2017 22:05   Ct Abdomen Pelvis Wo Contrast  Result Date: 12/23/2017 CLINICAL DATA:  Unwitnessed fall.  Question she tube pulled out. EXAM: CT ABDOMEN AND PELVIS WITHOUT CONTRAST TECHNIQUE: Multidetector CT imaging of the abdomen and pelvis was performed following the standard protocol without IV contrast. COMPARISON:  Plain films 11/29/2017.  CT 05/06/2017. FINDINGS: Lower chest: Pacer wires noted in the right heart. Aortic calcifications. No aneurysm. Mild cardiomegaly. Dependent atelectasis or scarring. No effusions. Hepatobiliary: No focal hepatic abnormality. Questionable layering gallstones within the gallbladder. Pancreas: No focal abnormality or ductal dilatation. Spleen: No focal abnormality.  Normal size.  Adrenals/Urinary Tract: Mildly atrophic kidneys bilaterally. No hydronephrosis. Urinary bladder is decompressed, grossly unremarkable. Adrenal glands unremarkable. Stomach/Bowel: No gastrostomy tube is seen within the stomach or in the abdominal wall. A tract is noted from the anterior abdominal wall, but no visible gastrostomy tube. Appendix is normal. No evidence of bowel obstruction. Large stool burden in the colon, particularly rectosigmoid colon, question fecal impaction. Vascular/Lymphatic: Diffuse aortic and iliac/branch vessel calcification. No evidence of aneurysm or adenopathy. Reproductive: No visible focal abnormality. Other: No free fluid or free air. Musculoskeletal: No acute bony abnormality. IMPRESSION: No visible gastrostomy tube within the stomach or anterior abdominal wall. Suspect layering gallstones. Mildly atrophic kidneys.  No hydronephrosis. Diffuse aortoiliac atherosclerosis. Large volume stool in the rectosigmoid colon, question fecal impaction. Electronically Signed   By: Rolm Baptise M.D.   On: 12/23/2017 08:54   Dg Chest 1 View  Result Date: 12/23/2017 CLINICAL DATA:  Pain following fall EXAM: CHEST  1 VIEW COMPARISON:  November 22, 2017 FINDINGS: Central catheter tip is in the right atrium. No pneumothorax. There is no edema or consolidation. Heart is mildly enlarged. Pacemaker leads are attached to the right atrium and right ventricle. No adenopathy. No bone lesions. IMPRESSION: Central catheter tip in right atrium without pneumothorax. No edema or consolidation. Stable cardiac silhouette. Stable pacemaker lead positioning. Electronically Signed   By: Lowella Grip III M.D.   On: 12/23/2017 08:44   Ct Head Wo Contrast  Result Date: 12/23/2017 CLINICAL DATA:  Unwitnessed fall.  Altered level of consciousness EXAM: CT HEAD WITHOUT CONTRAST CT CERVICAL SPINE WITHOUT CONTRAST TECHNIQUE: Multidetector CT imaging of the head and cervical spine was performed following the standard  protocol without intravenous contrast. Multiplanar CT image reconstructions of the cervical spine were also generated. COMPARISON:  MRI 11/26/2017 FINDINGS: CT HEAD FINDINGS Brain: There is atrophy and chronic small vessel disease changes. No acute intracranial abnormality. Specifically, no hemorrhage, hydrocephalus, mass lesion, acute infarction, or significant intracranial injury. Vascular: No hyperdense vessel or unexpected calcification. Skull: No acute calvarial abnormality. Sinuses/Orbits: Visualized paranasal sinuses and mastoids clear. Orbital  soft tissues unremarkable. Other: None CT CERVICAL SPINE FINDINGS Alignment: No subluxation Skull base and vertebrae: No fracture. Scattered mottled lucent areas throughout the vertebral bodies, most pronounced adjacent to the disc spaces, favor discogenic changes. Soft tissues and spinal canal: No prevertebral fluid or swelling. No visible canal hematoma. Disc levels: Diffuse degenerative disc changes with disc space narrowing and spurring. Upper chest: No acute findings Other: No acute findings.  Carotid artery calcifications. IMPRESSION: No acute intracranial abnormality. Atrophy, chronic microvascular disease. Diffuse degenerative disc disease throughout the cervical spine. No fracture or subluxation. Electronically Signed   By: Rolm Baptise M.D.   On: 12/23/2017 08:59   Ct Cervical Spine Wo Contrast  Result Date: 12/23/2017 CLINICAL DATA:  Unwitnessed fall.  Altered level of consciousness EXAM: CT HEAD WITHOUT CONTRAST CT CERVICAL SPINE WITHOUT CONTRAST TECHNIQUE: Multidetector CT imaging of the head and cervical spine was performed following the standard protocol without intravenous contrast. Multiplanar CT image reconstructions of the cervical spine were also generated. COMPARISON:  MRI 11/26/2017 FINDINGS: CT HEAD FINDINGS Brain: There is atrophy and chronic small vessel disease changes. No acute intracranial abnormality. Specifically, no hemorrhage,  hydrocephalus, mass lesion, acute infarction, or significant intracranial injury. Vascular: No hyperdense vessel or unexpected calcification. Skull: No acute calvarial abnormality. Sinuses/Orbits: Visualized paranasal sinuses and mastoids clear. Orbital soft tissues unremarkable. Other: None CT CERVICAL SPINE FINDINGS Alignment: No subluxation Skull base and vertebrae: No fracture. Scattered mottled lucent areas throughout the vertebral bodies, most pronounced adjacent to the disc spaces, favor discogenic changes. Soft tissues and spinal canal: No prevertebral fluid or swelling. No visible canal hematoma. Disc levels: Diffuse degenerative disc changes with disc space narrowing and spurring. Upper chest: No acute findings Other: No acute findings.  Carotid artery calcifications. IMPRESSION: No acute intracranial abnormality. Atrophy, chronic microvascular disease. Diffuse degenerative disc disease throughout the cervical spine. No fracture or subluxation. Electronically Signed   By: Rolm Baptise M.D.   On: 12/23/2017 08:59   Ir Replc Gastro/colonic Tube Percut W/fluoro  Result Date: 12/23/2017 INDICATION: 67 year old male with a percutaneous gastrostomy tube which was placed 2 weeks ago at an outside institution. Unfortunately, his tube was inadvertently dislodged last night when he fell out of bed. He presents from the emergency room for attempted tube rescue. EXAM: GASTROSTOMY CATHETER REPLACEMENT MEDICATIONS: None ANESTHESIA/SEDATION: None CONTRAST:  15 mL Isovue-300-administered into the gastric lumen. FLUOROSCOPY TIME:  Fluoroscopy Time: 0 minutes 36 seconds (4 mGy). COMPLICATIONS: None immediate. PROCEDURE: Informed written consent was obtained from the patient after a thorough discussion of the procedural risks, benefits and alternatives. All questions were addressed. A timeout was performed prior to the initiation of the procedure. An angled catheter was carefully navigated through the existing ostomy  site. Using gentle hand injections of contrast material, the soft tissue tract from the skin exit site into the stomach was successfully identified and the catheter was navigated through the tract and into the stomach. A 0.035 wire was then coiled in the gastric fundus. The angled catheter was removed. An 55 French percutaneous balloon retention gastrostomy tube was lubricated in advanced over the wire into the stomach. The retention balloon was filled with 9 mL sterile saline and pulled snug against the anterior abdominal wall. The external bumper was affixed in place. A final hand injection of contrast material was performed under fluoroscopy confirming the tube location within the stomach. The tube was then flushed with saline. The patient tolerated the procedure well. IMPRESSION: Successful gastrostomy tube rescue with placement of a  new 35 French balloon retention gastrostomy tube. Electronically Signed   By: Jacqulynn Cadet M.D.   On: 12/23/2017 17:43   Dg Chest Port 1 View  Result Date: 12/30/2017 CLINICAL DATA:  Cough.  Altered mental status. EXAM: PORTABLE CHEST 1 VIEW COMPARISON:  12/23/2017. FINDINGS: Left jugular catheter tip in the right atrium. Stable right subclavian pacemaker leads. Normal sized heart. Clear lungs with normal vascularity. Thoracic spine degenerative changes. IMPRESSION: No acute abnormality. The left jugular catheter tip remains in the right atrium. Electronically Signed   By: Claudie Revering M.D.   On: 12/30/2017 12:50    Time Spent in minutes  30   Lala Lund M.D on 01/02/2018 at 10:52 AM  Between 7am to 7pm - Pager - [phone] ( page via [email or web address], text pages only, please mention full 10 digit call back number). After 7pm go to [email or web address] - password [email or web address]

## 2018-01-02 NOTE — Progress Notes (Addendum)
[facility] Progress Note   Subjective:  Responds to questions, mumbles then falls asleep HD last night. Net UF 1L   Objective Vitals:   01/02/18 0157 01/02/18 0255 01/02/18 0534 01/02/18 0804  BP:  132/78 (!) 141/74 117/78  Pulse: 87     Resp: (!) 24 (!) 26  (!) 25  Temp:    99.5 F (37.5 C)  TempSrc:    Oral  SpO2: 94%   99%  Weight:      Height:       Physical Exam General:Obese male, lethargic NAD Lungs:No increased WOB. Grossly CTAB Heart:RRR with S1 S2 Abdomen:obese soft PEG tube in place Lower extremities:bilat BKA. No stump edema Neuro:Poorly responsive to commands. Falls asleep during questioning Dialysis Access:L IJ TDC. Dsg clean. No exit site drainage   Dialysis Orders:   [place] [facility]   4h  88kg  2/2 bath  p4  L TDC heparin 4000 then 2000 midrun  -venofer 100 mg x 10 to start 6/8  Assessment/Plan: 1  Fevers/ sepsis - scant basilar infiltrates at bases on CT. BCx 1/4+ for MRSE. On iv abx. Has tunneled cath in place for HD. Has pacemaker as well. Will plan to have dialysis tomorrow off schedule and then have [place] removed after dialysis for cath holiday.   2  Confusion - still very groggy, not sure new baseline after recent similar admit 3  Esrd on HD TTS. Next HD 6/11  4  Hypotension - bp's are usually normal at OP HD. Midodrine added. Better today  5  Anemia ckd - Hgb trending down 9.3. Resume esa with next HD. Holding IV Fe here.   6  mbd ckd - cont meds 7  Afib - on anticoag 8  DM2 9  FTT - failure to thrive, have consulted PC team  Lynnda Child PA-C [facility] Pager [phone] 01/02/2018,11:03 AM  LOS: 3 days   Pt seen, examined, agree w assess/plan as above.   Kelly Splinter MD [facility] pager [phone]    cell [phone] 01/02/2018, 12:59 PM     Additional Objective Labs: Basic Metabolic Panel: Recent Labs  Lab 12/31/17 0213 01/01/18 0328 01/02/18 0404  NA 133* 133* 139   K 4.4 4.6 3.9  CL 95* 94* 96*  CO2 25 24 30   GLUCOSE 105* 179* 179*  BUN 68* 88* 36*  CREATININE 8.79* 10.07* 5.28*  CALCIUM 8.7* 8.9 9.1  PHOS 3.0 2.7 1.2*   CBC: Recent Labs  Lab 12/30/17 1242  12/31/17 0213 01/01/18 0328 01/02/18 0404  WBC 10.4  --  12.1* 11.7* 9.6  NEUTROABS 8.5*  --   --   --   --   HGB 11.4*   < > 9.3* 9.2* 9.3*  HCT 35.9*   < > 29.1* 28.9* 29.0*  MCV 88.6  --  89.0 89.2 86.3  PLT 317  --  235 280 318   < > = values in this interval not displayed.   Blood Culture    Component Value Date/Time   SDES BLOOD RIGHT ANTECUBITAL 12/30/2017 1255   SPECREQUEST  12/30/2017 1255    BOTTLES DRAWN AEROBIC AND ANAEROBIC Blood Culture adequate volume   CULT  12/30/2017 1255    NO GROWTH 2 DAYS Performed at [facility], [place] [address] [place], [place] [address]    REPTSTATUS PENDING 12/30/2017 1255    Cardiac Enzymes: Recent Labs  Lab 12/30/17 1449 12/30/17 1815 12/31/17 0213  TROPONINI 0.05* 0.05*  0.06*   CBG: Recent Labs  Lab 01/01/18 1615 01/01/18 1922 01/01/18 2340 01/02/18 0535 01/02/18 0743  GLUCAP 163* 181* 169* 175* 174*   Iron Studies: No results for input(s): IRON, TIBC, TRANSFERRIN, FERRITIN in the last 72 hours. Lab Results  Component Value Date   INR 1.62 01/02/2018   INR 7.03 (HH) 01/01/2018   INR 5.91 (HH) 12/31/2017   Medications: . sodium chloride 10 mL/hr at 01/01/18 2000  . sodium chloride    . sodium chloride    . heparin 1,250 Units/hr (01/02/18 1017)  . piperacillin-tazobactam (ZOSYN)  IV 3.375 g (01/02/18 1009)   . amiodarone  200 mg Per Tube Daily  . atorvastatin  40 mg Per Tube QHS  . Chlorhexidine Gluconate Cloth  6 each Topical Q0600  . escitalopram  10 mg Per Tube Daily  . famotidine  20 mg Oral QHS  . feeding supplement (NEPRO CARB STEADY)  1,000 mL Per Tube Q24H  . feeding supplement (PRO-STAT SUGAR FREE 64)  30 mL Per Tube TID  . insulin aspart  0-9 Units Subcutaneous Q4H  . latanoprost  1  drop Both Eyes QHS  . mouth rinse  15 mL Mouth Rinse BID  . midodrine  5 mg Per Tube BID WC  . sevelamer carbonate  1,600 mg Per Tube BID WC

## 2018-01-02 NOTE — Progress Notes (Addendum)
ANTICOAGULATION CONSULT NOTE - Follow Up Consult  Pharmacy Consult for Heparin when INR <2/coumadin Indication: atrial fibrillation  Patient Measurements: Height: 5\' 10"  (177.8 cm) Weight: 198 lb 3.1 oz (89.9 kg) IBW/kg (Calculated) : 73 Heparin Dosing Weight: 89 kg  Labs: Recent Labs    12/30/17 1449 12/30/17 1815 12/31/17 0213 12/31/17 2050 01/01/18 0328 01/02/18 0404  HGB  --   --  9.3*  --  9.2* 9.3*  HCT  --   --  29.1*  --  28.9* 29.0*  PLT  --   --  235  --  280 318  LABPROT 32.2*  --   --  52.5* 60.1* 19.1*  INR 3.17  --   --  5.91* 7.03* 1.62  CREATININE  --   --  8.79*  --  10.07* 5.28*  TROPONINI 0.05* 0.05* 0.06*  --   --   --    ESRD  Assessment: 67 yr old Castillo on Coumadin prior to admission for atrial fibrillation.  INR 3.17 on admit 6/6, Coumadin held, INR increased to 7.03 and 5mg  vit K given 6/8.  Pharmacy to begin IV heparin when INR <2.  Now INR 1.62 s/p vit K, CBC low but stable, plts wnl.        PTA Coumadin:  5.5 mg daily. Last dose 12/29/17    (dose increased from 5 mg daily on 12/27/17 per facility [facility])   Goal of Therapy:  Heparin level 0.3-0.7 units/ml Monitor platelets by anticoagulation protocol: Yes   Plan:  Initiate heparin bolus of 5000 units, then heparin gtt at 1250 units/hr F/u 8h heparin level at 1700 Monitor daily heparin level, CBC, s/s bleeding  Bertis Ruddy, PharmD Pharmacy Resident [phone] 01/02/2018 8:59 AM   Addendum:  Consult to start warfarin added Give warfarin 5.5 mg x 1 Monitor daily INR in addition to above plan  Bertis Ruddy, PharmD Pharmacy Resident [phone] 01/02/2018 Oscar:31 AM

## 2018-01-03 DIAGNOSIS — Z515 Encounter for palliative care: Secondary | ICD-10-CM

## 2018-01-03 DIAGNOSIS — Z7189 Other specified counseling: Secondary | ICD-10-CM

## 2018-01-03 DIAGNOSIS — A419 Sepsis, unspecified organism: Secondary | ICD-10-CM

## 2018-01-03 DIAGNOSIS — Z992 Dependence on renal dialysis: Secondary | ICD-10-CM

## 2018-01-03 DIAGNOSIS — N186 End stage renal disease: Secondary | ICD-10-CM

## 2018-01-03 LAB — CBC
HCT: 27.7 % — ABNORMAL LOW (ref 39.0–52.0)
Hemoglobin: 8.8 g/dL — ABNORMAL LOW (ref 13.0–17.0)
MCH: 27.9 pg (ref 26.0–34.0)
MCHC: 31.8 g/dL (ref 30.0–36.0)
MCV: 87.9 fL (ref 78.0–100.0)
PLATELETS: 343 10*3/uL (ref 150–400)
RBC: 3.15 MIL/uL — ABNORMAL LOW (ref 4.22–5.81)
RDW: 16.1 % — AB (ref 11.5–15.5)
WBC: 9.3 10*3/uL (ref 4.0–10.5)

## 2018-01-03 LAB — GLUCOSE, CAPILLARY
GLUCOSE-CAPILLARY: 177 mg/dL — AB (ref 65–99)
GLUCOSE-CAPILLARY: 179 mg/dL — AB (ref 65–99)
GLUCOSE-CAPILLARY: 196 mg/dL — AB (ref 65–99)
GLUCOSE-CAPILLARY: 203 mg/dL — AB (ref 65–99)
Glucose-Capillary: 194 mg/dL — ABNORMAL HIGH (ref 65–99)

## 2018-01-03 LAB — RENAL FUNCTION PANEL
Albumin: 2.1 g/dL — ABNORMAL LOW (ref 3.5–5.0)
Anion gap: 11 (ref 5–15)
BUN: 66 mg/dL — ABNORMAL HIGH (ref 6–20)
CALCIUM: 8.7 mg/dL — AB (ref 8.9–10.3)
CHLORIDE: 97 mmol/L — AB (ref 101–111)
CO2: 28 mmol/L (ref 22–32)
CREATININE: 6.92 mg/dL — AB (ref 0.61–1.24)
GFR calc Af Amer: 9 mL/min — ABNORMAL LOW (ref >60)
GFR calc non Af Amer: 7 mL/min — ABNORMAL LOW (ref >60)
Glucose, Bld: 191 mg/dL — ABNORMAL HIGH (ref 65–99)
Phosphorus: 1 mg/dL — CL (ref 2.5–4.6)
Potassium: 3.3 mmol/L — ABNORMAL LOW (ref 3.5–5.1)
SODIUM: 136 mmol/L (ref 135–145)

## 2018-01-03 LAB — PROTIME-INR
INR: 1.8
Prothrombin Time: 20.8 seconds — ABNORMAL HIGH (ref 11.4–15.2)

## 2018-01-03 LAB — HEPARIN LEVEL (UNFRACTIONATED)
Heparin Unfractionated: 0.55 IU/mL (ref 0.30–0.70)
Heparin Unfractionated: 0.67 IU/mL (ref 0.30–0.70)

## 2018-01-03 MED ORDER — LIDOCAINE HCL 2 % IJ SOLN
0.0000 mL | Freq: Once | INTRAMUSCULAR | Status: DC | PRN
  Filled 2018-01-03: qty 20

## 2018-01-03 MED ORDER — DARBEPOETIN ALFA 60 MCG/0.3ML IJ SOSY: 60.0000 ug | PREFILLED_SYRINGE | INTRAMUSCULAR | Status: DC

## 2018-01-03 MED ORDER — POTASSIUM CHLORIDE 20 MEQ/15ML (10%) PO SOLN
40.0000 meq | Freq: Once | ORAL | Status: AC
  Administered 2018-01-03: 40 meq via ORAL
  Filled 2018-01-03: qty 30

## 2018-01-03 MED ORDER — VANCOMYCIN HCL IN DEXTROSE 1-5 GM/200ML-% IV SOLN
1000.0000 mg | INTRAVENOUS | Status: DC
Start: 2018-01-06 — End: 2018-01-07
  Administered 2018-01-06: 1000 mg via INTRAVENOUS
  Filled 2018-01-03: qty 200

## 2018-01-03 MED ORDER — ACETAMINOPHEN 325 MG PO TABS
650.0000 mg | ORAL_TABLET | Freq: Four times a day (QID) | ORAL | Status: DC | PRN
  Administered 2018-01-03 – 2018-01-09 (×5): 650 mg
  Filled 2018-01-03 (×5): qty 2

## 2018-01-03 MED ORDER — DARBEPOETIN ALFA 60 MCG/0.3ML IJ SOSY
PREFILLED_SYRINGE | INTRAMUSCULAR | Status: AC
  Administered 2018-01-03: 60 ug via INTRAVENOUS
  Filled 2018-01-03: qty 0.3

## 2018-01-03 MED ORDER — SODIUM CHLORIDE 0.9 % IV SOLN: 100.0000 mL | INTRAVENOUS | Status: DC | PRN

## 2018-01-03 MED ORDER — VANCOMYCIN HCL IN DEXTROSE 1-5 GM/200ML-% IV SOLN
1000.0000 mg | INTRAVENOUS | Status: AC
  Administered 2018-01-03: 1000 mg via INTRAVENOUS

## 2018-01-03 MED ORDER — MIDODRINE HCL 5 MG PO TABS
ORAL_TABLET | ORAL | Status: AC
  Administered 2018-01-03: 5 mg
  Filled 2018-01-03: qty 1

## 2018-01-03 MED ORDER — HEPARIN SODIUM (PORCINE) 1000 UNIT/ML DIALYSIS
4000.0000 [IU] | Freq: Once | INTRAMUSCULAR | Status: AC
  Administered 2018-01-03: 4000 [IU] via INTRAVENOUS_CENTRAL
  Filled 2018-01-03: qty 4

## 2018-01-03 MED ORDER — DARBEPOETIN ALFA 60 MCG/0.3ML IJ SOSY
60.0000 ug | PREFILLED_SYRINGE | INTRAMUSCULAR | Status: AC
  Administered 2018-01-03: 60 ug via INTRAVENOUS

## 2018-01-03 MED ORDER — VANCOMYCIN HCL IN DEXTROSE 1-5 GM/200ML-% IV SOLN
INTRAVENOUS | Status: AC
  Administered 2018-01-03: 1000 mg via INTRAVENOUS
  Filled 2018-01-03: qty 200

## 2018-01-03 NOTE — Progress Notes (Signed)
[facility] text paged in regards to patients potassium level of 3.3. I will continue to follow physician's orders and monitor patient closely.

## 2018-01-03 NOTE — Progress Notes (Signed)
Pharmacy Antibiotic Note  Oscar Castillo is a 67 y.o. male admitted on 12/30/2017 with AMS / sepsis.  Currently on IV Vancomycin for coag neg staph bacteremia.   Pt received Vancomycin loading dose 6/6 and has received doses post HD on 6/8 and 6/10.  HD cath pulled today with plans for line holiday and replacement possibly on Thursday (6/13).  Plan: Vancomycin 1gm IV x 1 today (HD off schedule) Vancomycin 1gm TTS with HD starting 6/13 - follow-up line placement Plan for 7d Vancomycin per Dr. Candiss Norse - assuming cx remaining negative - stop date would be 6/17.  Height: 5\' 10"  (177.8 cm) Weight: 201 lb 4.5 oz (91.3 kg) IBW/kg (Calculated) : 73  Temp (24hrs), Avg:99.5 F (37.5 C), Min:98.8 F (37.1 C), Max:101.3 F (38.5 C)  Recent Labs  Lab 12/30/17 1242 12/30/17 1243 12/30/17 1244 12/30/17 1449 12/30/17 1815 12/31/17 0213 01/01/18 0328 01/02/18 0404 01/03/18 0513 01/03/18 0720  WBC 10.4  --   --   --   --  12.1* 11.7* 9.6 9.3  --   CREATININE 8.88* 8.20*  --   --   --  8.79* 10.07* 5.28*  --  6.92*  LATICACIDVEN  --   --  2.79* 4.2* 2.6*  --   --   --   --   --     Estimated Creatinine Clearance: 11.9 mL/min (A) (by C-G formula based on SCr of 6.92 mg/dL (H)).    Allergies  Allergen Reactions  . Ceftriaxone Sodium In Dextrose Anaphylaxis    Tolerates Zosyn  . Ciprofloxacin Other (See Comments)    UNSPECIFIED PAIN  . Nsaids Other (See Comments)    HISTORY OF ULCER  . Penicillins Swelling    Tolerates Zosyn [ PATIENT WITH HX OF ANAPHYLAXIS TO CEFTRIAXONE ] 11/26/17:  pt has received 3 doses of zosyn in past (04/2017.) No allergy tor adverse event to zosyn noted at that time.     . Sulfa Antibiotics Swelling    SWELLING REACTION UNSPECIFIED     Antimicrobials this admission: 6/6 Vanco >> 6/6 Zosyn >> 6/10  Dose adjustments this admission:   Microbiology results: 6/6 BCx: 1/2 coag neg staph 6/9 BCx: ng x 1 d 6/10 BCx:  6/8: C Diff antigen/toxin negative MRSA PCR  negative  Thank you for allowing pharmacy to be a part of this patient's care.  [employer], Pharm.D., BCPS Clinical Pharmacist 01/03/2018 4:40 PM

## 2018-01-03 NOTE — Consult Note (Signed)
Hospital Consult    Reason for Consult:  Bacteremia, esrd Referring Physician:  Dr. Candiss Norse MRN #:  [id number]  History of Present Illness: This is a 67 y.o. male is here with recurrent fevers and has toxic encephalopathy found to have 1 of 2 blood cultures positive for coag negative staph.  He is on dialysis via a tunneled dialysis catheter in his left internal jugular vein.  In the past he has refused right upper extremity AV grafting.  Catheter was placed and dialysis center I do not have the timeframe.  Patient is confused at baseline.    Past Medical History:  Diagnosis Date  . Anemia   . Antral ulcer 2014   small  . BENIGN PROSTATIC HYPERTROPHY   . Bipolar disorder ([place])    "sometimes" (10/07/2016)  . CHOLELITHIASIS   . Chronic combined systolic and diastolic CHF (congestive heart failure) ([place])   . Complication of anesthesia    wife states pt had trouble waking up with in Nov., 2014  . CVA (cerebral vascular accident) ([place]) 07/2007   No residual effect  . DEPRESSION   . DIABETES MELLITUS, TYPE II    diet control.  No medication  since November 2015  . ERECTILE DYSFUNCTION   . ESRD on hemodialysis ([place])    ESRD due to DM/HTN. .  HD TTS at [facility] on [address].  [name] GERD   . GI bleed    due to gastritis, discharged 10/02/16/notes 10/07/2016  . Hepatitis C    C - has been treated  . History of Clostridium difficile   . History of kidney stones   . HYPERTENSION   . LBBB (left bundle branch block)   . Morbid obesity ([place])   . PAF (paroxysmal atrial fibrillation) ([place])    a. Dx 12/2015.    Past Surgical History:  Procedure Laterality Date  . AMPUTATION Left 05/12/2013   Procedure: AMPUTATION RAY;  Surgeon: Newt Minion, MD;  Location: [place];  Service: Orthopedics;  Laterality: Left;  Left Foot 1st Ray Amputation  . AMPUTATION Left 06/09/2013   Procedure: AMPUTATION BELOW KNEE;  Surgeon: Newt Minion, MD;  Location: [place];  Service: Orthopedics;  Laterality: Left;   Left Below Knee Amputation and removal proximal screws IM tibial nail  . AMPUTATION Right 09/08/2013   Procedure: AMPUTATION BELOW KNEE;  Surgeon: Newt Minion, MD;  Location: [place];  Service: Orthopedics;  Laterality: Right;  Right Below Knee Amputation  . AMPUTATION Right 10/11/2013   Procedure: AMPUTATION BELOW KNEE;  Surgeon: Newt Minion, MD;  Location: [place];  Service: Orthopedics;  Laterality: Right;  Right Below Knee Amputation Revision  . AV FISTULA PLACEMENT  06/14/2012   Procedure: ARTERIOVENOUS (AV) FISTULA CREATION;  Surgeon: Angelia Mould, MD;  Location: [facility] OR;  Service: Vascular;  Laterality: Left;  Left basilic vein transposition with fistula.  . AV FISTULA PLACEMENT Left 07/21/2017   Procedure: Attempt INSERTION OF ARTERIOVENOUS (AV) WITH  ARTEGRAFT Left  UPPER ARM;  Surgeon: Conrad Mechanicsburg, MD;  Location: [place];  Service: Vascular;  Laterality: Left;  . BASCILIC VEIN TRANSPOSITION Left 02/17/2017   Procedure: LEFT 1ST STAGE BRACHIAL VEIN TRANSPOSITION;  Surgeon: Conrad , MD;  Location: [place];  Service: Vascular;  Laterality: Left;  . BASCILIC VEIN TRANSPOSITION Left 04/21/2017   Procedure: LEFT 2ND STAGE BRACHIAL VEIN TRANSPOSITION;  Surgeon: Conrad , MD;  Location: [place];  Service: Vascular;  Laterality: Left;  . COLONOSCOPY  N/A 10/28/2012   Procedure: COLONOSCOPY;  Surgeon: Jeryl Columbia, MD;  Location: [facility] ENDOSCOPY;  Service: Endoscopy;  Laterality: N/A;  . COLONOSCOPY N/A 11/02/2012   Procedure: COLONOSCOPY;  Surgeon: Cleotis Nipper, MD;  Location: [facility] ENDOSCOPY;  Service: Endoscopy;  Laterality: N/A;  . COLONOSCOPY N/A 11/03/2012   Procedure: COLONOSCOPY;  Surgeon: Cleotis Nipper, MD;  Location: [facility] ENDOSCOPY;  Service: Endoscopy;  Laterality: N/A;  . COLONOSCOPY N/A 09/16/2016   Procedure: COLONOSCOPY;  Surgeon: Jerene Bears, MD;  Location: [facility] ENDOSCOPY;  Service: Gastroenterology;  Laterality: N/A;  . ENTEROSCOPY N/A 11/08/2012   Procedure: ENTEROSCOPY;   Surgeon: Wonda Horner, MD;  Location: [facility] ENDOSCOPY;  Service: Endoscopy;  Laterality: N/A;  . ESOPHAGOGASTRODUODENOSCOPY N/A 11/02/2012   Procedure: ESOPHAGOGASTRODUODENOSCOPY (EGD);  Surgeon: Cleotis Nipper, MD;  Location: [facility] ENDOSCOPY;  Service: Endoscopy;  Laterality: N/A;  . ESOPHAGOGASTRODUODENOSCOPY (EGD) WITH PROPOFOL N/A 09/16/2016   Procedure: ESOPHAGOGASTRODUODENOSCOPY (EGD) WITH PROPOFOL;  Surgeon: Jerene Bears, MD;  Location: [facility] ENDOSCOPY;  Service: Gastroenterology;  Laterality: N/A;  . EXCHANGE OF A DIALYSIS CATHETER Left 11/13/2016   Procedure: EXCHANGE OF A DIALYSIS CATHETER;  Surgeon: Elam Dutch, MD;  Location: [place];  Service: Vascular;  Laterality: Left;  . EYE SURGERY Left    to remove scar tissue  . GIVENS CAPSULE STUDY N/A 11/04/2012   Procedure: GIVENS CAPSULE STUDY;  Surgeon: Cleotis Nipper, MD;  Location: [facility] ENDOSCOPY;  Service: Endoscopy;  Laterality: N/A;  . GIVENS CAPSULE STUDY N/A 09/29/2016   Procedure: GIVENS CAPSULE STUDY;  Surgeon: Manus Gunning, MD;  Location: [place];  Service: Gastroenterology;  Laterality: N/A;  try to keep pt up in bedside chair as much as possible during the study.    [name] HARDWARE REMOVAL Left 06/09/2013   Procedure: HARDWARE REMOVAL;  Surgeon: Newt Minion, MD;  Location: [place];  Service: Orthopedics;  Laterality: Left;  Left Below Knee Amputation  and Removal proximal screws IM tibial nail  . HEMATOMA EVACUATION Left 04/29/2017   Procedure: EVACUATION HEMATOMA LEFT ARM;  Surgeon: Conrad Weedpatch, MD;  Location: [place];  Service: Vascular;  Laterality: Left;  . INSERTION OF DIALYSIS CATHETER N/A 11/09/2016   Procedure: INSERTION OF TEMPORARY DIALYSIS CATHETER;  Surgeon: Elam Dutch, MD;  Location: [place];  Service: Vascular;  Laterality: N/A;  . IR GENERIC HISTORICAL  09/27/2016   IR US GUIDE VASC ACCESS RIGHT 09/27/2016 Aletta Edouard, MD [facility]-INTER[facility]  . IR GENERIC HISTORICAL  09/27/2016   IR FLUORO GUIDE CV LINE RIGHT  09/27/2016 Aletta Edouard, MD [facility]  . IR [place] PERCUT W/FLUORO  12/23/2017  . LIGATION OF ARTERIOVENOUS  FISTULA Left 11/09/2016   Procedure: LIGATION OF ARTERIOVENOUS  FISTULA;  Surgeon: Elam Dutch, MD;  Location: [place];  Service: Vascular;  Laterality: Left;  . ORIF FIBULA FRACTURE Left 09/09/2012   Procedure: OPEN REDUCTION INTERNAL FIXATION (ORIF) FIBULA FRACTURE;  Surgeon: Johnny Bridge, MD;  Location: [place];  Service: Orthopedics;  Laterality: Left;  . PACEMAKER IMPLANT N/A 04/30/2017   Procedure: PACEMAKER IMPLANT;  Surgeon: Evans Lance, MD;  Location: [place] [facility];  Service: Cardiovascular;  Laterality: N/A;  . TEMPORARY PACEMAKER N/A 04/29/2017   Procedure: TEMPORARY PACEMAKER;  Surgeon: Lorretta Harp, MD;  Location: [place] CV LAB;  Service: Cardiovascular;  Laterality: N/A;  . TIBIA IM NAIL INSERTION Left 09/09/2012   Procedure: INTRAMEDULLARY (IM) NAIL TIBIAL;  Surgeon: Johnny Bridge, MD;  Location: [place];  Service: Orthopedics;  Laterality: Left;  left tibial nail and open reduction internal fixation left fibula fracture  . UPPER EXTREMITY VENOGRAPHY N/A 12/14/2016   Procedure: Bilateral Upper Extremity Venography and Central Venography;  Surgeon: Conrad Bienville, MD;  Location: [place] [facility];  Service: Cardiovascular;  Laterality: N/A;    Allergies  Allergen Reactions  . Ceftriaxone Sodium In Dextrose Anaphylaxis    Tolerates Zosyn  . Ciprofloxacin Other (See Comments)    UNSPECIFIED PAIN  . Nsaids Other (See Comments)    HISTORY OF ULCER  . Penicillins Swelling    Tolerates Zosyn [ PATIENT WITH HX OF ANAPHYLAXIS TO CEFTRIAXONE ] 11/26/17:  pt has received 3 doses of zosyn in past (04/2017.) No allergy tor adverse event to zosyn noted at that time.     . Sulfa Antibiotics Swelling    SWELLING REACTION UNSPECIFIED     Prior to Admission medications   Medication Sig Start Date End Date Taking? Authorizing Provider    acetaminophen (TYLENOL) 325 MG tablet Take 650 mg by mouth every 8 (eight) hours. Do not exceed 4 GMS of Tylenol  In 24 hours   Yes [provider]  Amino Acids-Protein Hydrolys (FEEDING SUPPLEMENT, PRO-STAT SUGAR FREE 64,) LIQD Place 30 mLs into feeding tube 2 (two) times daily. Patient taking differently: Place 30 mLs into feeding tube every 12 (twelve) hours.  12/02/17  Yes Lavina Hamman, MD  amiodarone (PACERONE) 200 MG tablet Place 1 tablet (200 mg total) into feeding tube daily. 12/03/17  Yes Lavina Hamman, MD  atorvastatin (LIPITOR) 40 MG tablet Place 1 tablet (40 mg total) into feeding tube at bedtime. 12/02/17  Yes Lavina Hamman, MD  escitalopram (LEXAPRO) 10 MG tablet Place 10 mg into feeding tube daily.    Yes [provider]  famotidine (PEPCID) 20 MG tablet Place 20 mg into feeding tube at bedtime.    Yes [provider]  insulin lispro (HUMALOG) 100 UNIT/ML injection Inject 100 Units into the skin every 6 (six) hours. Sliding Scale Insulin as follows: 201-250 = 2 units 251-300 = 4 units 301-350 = 6 units 351-400 = 8 units 401-540  = 10 units Above 450 give 12 units and recheck in 2 hours  If check blood sugar glucose still above 450  Notify MD Notify MD if checked blood glucose  Under 60)   Yes [provider]  latanoprost (XALATAN) 0.005 % ophthalmic solution Place 1 drop into both eyes at bedtime.  06/12/16  Yes [provider]  midodrine (PROAMATINE) 5 MG tablet Place 1 tablet (5 mg total) into feeding tube 2 (two) times daily with a meal. Patient taking differently: Place 5 mg into feeding tube every 12 (twelve) hours.  12/02/17  Yes Lavina Hamman, MD  Nutritional Supplements (FEEDING SUPPLEMENT, NEPRO CARB STEADY,) LIQD Place 1,000 mLs into feeding tube daily. Patient taking differently: Place 1,000 mLs into feeding tube daily. Nepro 55 ml/hr 12/02/17  Yes Lavina Hamman, MD  sevelamer carbonate (RENVELA) 800 MG tablet Place 1,600  mg into feeding tube 2 (two) times daily.   Yes [provider]  sorbitol 70 % SOLN Take 30 mLs by mouth 2 (two) times daily. Patient taking differently: Place 30 mLs into feeding tube every 12 (twelve) hours.  05/09/17  Yes Nita Sells, MD  warfarin (COUMADIN) 5 MG tablet Place 5.5 mg into feeding tube one time only at 6 PM.   Yes [provider]  acetaminophen (TYLENOL) 160 MG/5ML solution Place 20.3 mLs (650 mg total) into feeding tube every 6 (six) hours as needed for headache or fever. Patient not taking: Reported on 12/30/2017 12/02/17   Lavina Hamman, MD  famotidine (PEPCID) 40 MG/5ML suspension Place 2.5 mLs (20 mg total) into feeding tube daily. Patient not taking: Reported on 12/30/2017 12/03/17   Lavina Hamman, MD    Social History   Socioeconomic History  . Marital status: Married    Spouse name: Not on file  . Number of children: 2  . Years of education: 79  . Highest education level: Not on file  Occupational History  . Occupation: disabled due to stroke    Employer: RETIRED  Social Needs  . Financial resource strain: Not on file  . Food insecurity:    Worry: Not on file    Inability: Not on file  . Transportation needs:    Medical: Not on file    Non-medical: Not on file  Tobacco Use  . Smoking status: Never Smoker  . Smokeless tobacco: Never Used  Substance and Sexual Activity  . Alcohol use: No  . Drug use: No  . Sexual activity: Yes    Birth control/protection: None  Lifestyle  . Physical activity:    Days per week: Not on file    Minutes per session: Not on file  . Stress: Not on file  Relationships  . Social connections:    Talks on phone: Not on file    Gets together: Not on file    Attends religious service: Not on file    Active member of club or organization: Not on file    Attends meetings of clubs or organizations: Not on file    Relationship status: Not on file  . Intimate partner violence:    Fear of current or ex  partner: Not on file    Emotionally abused: Not on file    Physically abused: Not on file    Forced sexual activity: Not on file  Other Topics Concern  . 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 Diabetes Mother   . Hypertension Mother   . Heart attack Father   . Hypertension Father   . Coronary artery disease Other     ROS: Cannot be obtained due to patient confusion  Physical Examination  Vitals:   01/03/18 1317 01/03/18 1514  BP:    Pulse:    Resp:    Temp: (!) 101.3 F (38.5 C) 100.1 F (37.8 C)  SpO2:     Body mass index is 28.88 kg/m.  General:  nad HENT: WNL, normocephalic Pulmonary: normal non-labored breathing  Cardiac: palpable radial pulses Abdomen: soft Musculoskeletal: Left IJ tunneled catheter without overt signs of infection. Neurologic: Confused at baseline  CBC    Component Value Date/Time   WBC 9.3 01/03/2018 0513   RBC 3.15 (L) 01/03/2018 0513   HGB 8.8 (L) 01/03/2018 0513   HCT 27.7 (L) 01/03/2018 0513   PLT 343 01/03/2018 0513   MCV 87.9 01/03/2018 0513   MCH 27.9 01/03/2018 0513   MCHC 31.8 01/03/2018 0513   RDW 16.1 (H) 01/03/2018 0513   LYMPHSABS 0.8 12/30/2017 1242   MONOABS 1.0 12/30/2017 1242   EOSABS 0.0 12/30/2017 1242   BASOSABS 0.0 12/30/2017 1242  BMET    Component Value Date/Time   NA 136 01/03/2018 0720   K 3.3 (L) 01/03/2018 0720   CL 97 (L) 01/03/2018 0720   CO2 28 01/03/2018 0720   GLUCOSE 191 (H) 01/03/2018 0720   BUN 66 (H) 01/03/2018 0720   CREATININE 6.92 (H) 01/03/2018 0720   CREATININE 8.97 (H) 10/07/2015 1500   CALCIUM 8.7 (L) 01/03/2018 0720   GFRNONAA 7 (L) 01/03/2018 0720   GFRNONAA 6 (L) 10/07/2015 1500   GFRAA 9 (L) 01/03/2018 0720   GFRAA 6 (L) 10/07/2015 1500    COAGS: Lab Results  Component Value Date   INR 1.80 01/03/2018   INR 1.62 01/02/2018    INR 7.03 (HH) 01/01/2018      ASSESSMENT/PLAN: This is a 67 y.o. male with end-stage renal disease on dialysis via left internal jugular catheter.  Plan is to remove catheter give him a holiday and plan replacement in the next couple days.    Tadashi Burkel C. Donzetta Matters, MD [facility] Office: [phone] Pager: [phone]

## 2018-01-03 NOTE — Progress Notes (Signed)
[facility] Progress Note   Subjective:  Responding verbally this am , still confused. On HD.   Objective Vitals:   01/03/18 0915 01/03/18 0930 01/03/18 0945 01/03/18 1000  BP: 113/67 (!) 149/65 (!) 110/49 (!) 176/101  Pulse: 90 88 85 85  Resp:      Temp:      TempSrc:      SpO2:      Weight:      Height:       Physical Exam General:Obese male, lethargic NAD Lungs:No increased WOB. Grossly CTAB Heart:RRR with S1 S2 Abdomen:obese soft PEG tube in place Lower extremities:bilat BKA. No stump edema Neuro: more alert and responding verbally, speech more clear. still disoriented.  Dialysis Access:L IJ TDC. Dsg clean. No exit site drainage   Dialysis Orders:   [facility]   4h  88kg  2/2 bath  p4  L TDC heparin 4000 then 2000 midrun  -venofer 100 mg x 10 to start 6/8  Assessment/Plan: 1  Fevers/ sepsis - scant basilar infiltrates at bases on CT. BCx 1/4+ for MRSE. On IV abx. Recurrent unexplained fevers in pt with chronic tunneled HD catheter -- plan is HD cath removal today after HD and replacement in 48-72 hrs. Have d/w VVS.   2  Confusion - a little better not sure baseline after recent similar admit 3  Esrd on HD TTS. HD today off schedule, next HD thursday 4  Hypotension - bp's are usually normal at OP HD. Midodrine added here. BP's are labile.  5  Anemia ckd - Hgb trending down 8.8. Resume esa with next HD. Holding IV Fe here.   6  mbd ckd - cont meds 7  Afib - on anticoag 8  DM2 9  FTT - failure to thrive, PC consult  Kelly Splinter MD [employer] pgr [phone]   01/03/2018, 10:38 AM     Additional Objective Labs: Basic Metabolic Panel: Recent Labs  Lab 01/01/18 0328 01/02/18 0404 01/03/18 0720  NA 133* 139 136  K 4.6 3.9 3.3*  CL 94* 96* 97*  CO2 24 30 28   GLUCOSE 179* 179* 191*  BUN 88* 36* 66*  CREATININE 10.07* 5.28* 6.92*  CALCIUM 8.9 9.1 8.7*  PHOS 2.7 1.2* 1.0*   CBC: Recent Labs  Lab  12/30/17 1242  12/31/17 0213 01/01/18 0328 01/02/18 0404 01/03/18 0513  WBC 10.4  --  12.1* 11.7* 9.6 9.3  NEUTROABS 8.5*  --   --   --   --   --   HGB 11.4*   < > 9.3* 9.2* 9.3* 8.8*  HCT 35.9*   < > 29.1* 28.9* 29.0* 27.7*  MCV 88.6  --  89.0 89.2 86.3 87.9  PLT 317  --  235 280 318 343   < > = values in this interval not displayed.   Blood Culture    Component Value Date/Time   SDES BLOOD RIGHT ANTECUBITAL 12/30/2017 1255   SPECREQUEST  12/30/2017 1255    BOTTLES DRAWN AEROBIC AND ANAEROBIC Blood Culture adequate volume   CULT  12/30/2017 1255    NO GROWTH 3 DAYS Performed at [facility], [place] [address], [place], [place] [address]    REPTSTATUS PENDING 12/30/2017 1255    Cardiac Enzymes: Recent Labs  Lab 12/30/17 1449 12/30/17 1815 12/31/17 0213  TROPONINI 0.05* 0.05* 0.06*   CBG: Recent Labs  Lab 01/02/18 1220 01/02/18 1641 01/02/18 2021 01/03/18 0029 01/03/18 0411  GLUCAP 200* 183* 172*  179* 177*   Iron Studies: No results for input(s): IRON, TIBC, TRANSFERRIN, FERRITIN in the last 72 hours. Lab Results  Component Value Date   INR 1.80 01/03/2018   INR 1.62 01/02/2018   INR 7.03 (HH) 01/01/2018   Medications: . sodium chloride Stopped (01/02/18 1800)  . sodium chloride    . sodium chloride    . sodium chloride    . sodium chloride    . heparin 1,500 Units/hr (01/02/18 2205)  . piperacillin-tazobactam (ZOSYN)  IV Stopped (01/03/18 0134)  . [START ON 01/04/2018] vancomycin     . amiodarone  200 mg Per Tube Daily  . atorvastatin  40 mg Per Tube QHS  . Chlorhexidine Gluconate Cloth  6 each Topical Q0600  . [START ON 01/04/2018] darbepoetin (ARANESP) injection - DIALYSIS  60 mcg Intravenous Q Tue-HD  . escitalopram  10 mg Per Tube Daily  . famotidine  20 mg Oral QHS  . feeding supplement (NEPRO CARB STEADY)  1,000 mL Per Tube Q24H  . feeding supplement (PRO-STAT SUGAR FREE 64)  30 mL Per Tube TID  . insulin aspart  0-9 Units Subcutaneous Q4H   . latanoprost  1 drop Both Eyes QHS  . mouth rinse  15 mL Mouth Rinse BID  . midodrine  5 mg Per Tube BID WC  . sevelamer carbonate  1,600 mg Per Tube BID WC  . Warfarin - Pharmacist Dosing Inpatient   Does not apply [phone]

## 2018-01-03 NOTE — Progress Notes (Signed)
@IPLOG @        PROGRESS NOTE                                                                                                                                                                                                             Patient Demographics:    Oscar Castillo, is a 67 y.o. male, DOB - July 18, 1951, KZS:[id number]  Admit date - 12/30/2017   Admitting Physician Aldean Jewett, MD  Outpatient Primary MD for the patient is [name], MD  LOS - 4  Chief Complaint  Patient presents with  . Altered Mental Status       Brief Narrative this is a 67 year old African-American gentleman who is bedbound, bilateral BKA, ESRD on dialysis, chronic systolic CHF, DM type II, hepatitis C, hypertension, and PEG tube placement due to ongoing encephalopathy for the last few months, multiple recent admissions in the last 3 months, who was discharged from this hospital a month ago, stayed in [place] for a few weeks thereafter was discharged to SNF from where he was brought again to [facility] on 12/30/2017 for sepsis and encephalopathy, his encephalopathy seems to be acute on chronic after discussing with wife it appears like he has been confused more or less for the last 2 months.  He was seen and treated under pulmonary critical care from 12/30/2017 to 01/02/2018, has been transferred under my service on 01/02/2018.  Still encephalopathic.   Subjective:   Patient laying in bed undergoing dialysis in the dialysis unit, appears to be in no distress, he is a unreliable historian but for now denies any headache chest or abdominal pain.   Assessment  & Plan :     1.  Severe sepsis with toxic encephalopathy.  No clear source, there was question of pneumonia upon admission, 1 out of 2 blood cultures coag negative staph positive, currently on vancomycin and Zosyn and sepsis physiology seems to have defervesced, still is encephalopathic, continue supportive care, continue present antibiotics,  repeat two-view chest x-ray.  Repeat surveillance blood cultures ordered on 01/02/2018 are negative so far, calcitonin levels are elevated at 9.5.  Long-term prognosis does not appear to be good.  Note he is a dialysis patient with left IJ tunneled dialysis access catheter and pacemaker.  Will discuss with ID on future course of action may need to pull dialysis catheter.  2.  Toxic encephalopathy.  This seems to be ongoing for the last 2 months at least, had long discussions with patient's wife on 01/02/2018, she  still wants to pursue aggressive measures and is somewhat unrealistic in her expectations.  Will continue present line of care and monitor.  3.  PEG tube feeds.  Virtually n.p.o. due to #2 above, continue PEG tube feeds.  4.  Stable enteritis on CT scan.  C. difficile negative, GI pathogen panel negative.  Likely diarrhea due to osmolar diuresis from tube feeds.  Continue to monitor.  5.  ESRD.  Renal on board getting dialyzed.  6.  Chronic combined systolic and diastolic heart failure EF now 30% down from 50% in 2015.  Likely EF down due to sepsis, patient has improved and will try to titrate down midodrine and add low-dose beta-blocker, no ACE/ARB due to ESRD.  Will continue to monitor.  Likely will need a repeat echo in couple of months.  7.  Paroxysmal atrial fibrillation.  Mali vas 2 score of 5.  On amiodarone, was on Coumadin at home, no anticoagulation in ICU, start on heparin and Coumadin bridge today.  Pacemaker.  8.  Bilateral BKA.  Supportive care.  9.  Dyslipidemia.  On statin continue.  10.  1 out of 2 positive bag negative staph bacteremia.  Could be contaminant, however has a pacemaker dialysis access, repeat surveillance cultures on 01/02/2018, check procalcitonin, continue empiric IV antibiotics which are vancomycin and Zosyn for now.  Transthoracic echocardiogram stable.  We will continue to monitor if needed will discuss with ID.   11. Stage 2 Sacral Decub Ulcer - POA -  Nursing care, see RN notes for details.   12. DM type II.  Continue present insulin regimen.  CBG (last 3)  Recent Labs    01/02/18 2021 01/03/18 0029 01/03/18 0411  GLUCAP 172* 179* 177*      Patient appears to have poor quality of life by virtue of being bedbound, bilateral BKA, feeding through PEG tube, ongoing encephalopathy for several weeks if not months with recurrent hospitalization, ESRD, poor heart function.  All conveyed to the wife however at this time she wants to continue aggressive measures.  Unfortunately she appears to have somewhat unrealistic expectations.    Diet : Tube Feeds  Family Communication  :  Wife in detail on 01/02/18  Code Status :  Full  Disposition Plan  :  SNF once Encephalopathy is better  Consults  :  PCCM  Procedures  :    CT Abd Pelvis - ? Enteritis  TTE - - Left ventricle: Poor acoustic windows limit study LVEF is  depressed at approximately 30 to 35% with severe hypokinesis/ akiensis of the septal wall and inferior wall The cavity size was  normal. Wall thickness was increased in a pattern of moderate  LVH. The estimated ejection fraction was in the range of 25% to   30%. - Aortic valve: AV is thickened, calcified with restricted motion  Peak gradient through the valve is 15 mm. 2 d imaging suggests that AS is moderate   Dimensionless index is 0.59, consistent with mild to moderate AS> - Mitral valve: Calcified annulus. Mildly thickened leaflets .   There was mild regurgitation.   DVT Prophylaxis  :   Heparin   Lab Results  Component Value Date   PLT 343 01/03/2018    Inpatient Medications  Scheduled Meds: . amiodarone  200 mg Per Tube Daily  . atorvastatin  40 mg Per Tube QHS  . Chlorhexidine Gluconate Cloth  6 each Topical Q0600  . [START ON 01/04/2018] darbepoetin (ARANESP) injection - DIALYSIS  60 mcg  Intravenous Q Tue-HD  . escitalopram  10 mg Per Tube Daily  . famotidine  20 mg Oral QHS  . feeding supplement (NEPRO  CARB STEADY)  1,000 mL Per Tube Q24H  . feeding supplement (PRO-STAT SUGAR FREE 64)  30 mL Per Tube TID  . insulin aspart  0-9 Units Subcutaneous Q4H  . latanoprost  1 drop Both Eyes QHS  . mouth rinse  15 mL Mouth Rinse BID  . midodrine  5 mg Per Tube BID WC  . sevelamer carbonate  1,600 mg Per Tube BID WC  . Warfarin - Pharmacist Dosing Inpatient   Does not apply q1800   Continuous Infusions: . sodium chloride Stopped (01/02/18 1800)  . sodium chloride    . sodium chloride    . sodium chloride    . sodium chloride    . heparin 1,500 Units/hr (01/02/18 2205)  . piperacillin-tazobactam (ZOSYN)  IV Stopped (01/03/18 0134)  . [START ON 01/04/2018] vancomycin     PRN Meds:.sodium chloride, sodium chloride, sodium chloride, sodium chloride, sodium chloride, alteplase, heparin, heparin, lidocaine (PF), lidocaine-prilocaine, pentafluoroprop-tetrafluoroeth  Antibiotics  :    Anti-infectives (From admission, onward)   Start     Dose/Rate Route Frequency Ordered Stop   01/04/18 1200  vancomycin (VANCOCIN) IVPB 1000 mg/200 mL premix     1,000 mg 200 mL/hr over 60 Minutes Intravenous Every T-Th-Sa (Hemodialysis) 01/02/18 1251     01/01/18 1200  vancomycin (VANCOCIN) IVPB 1000 mg/200 mL premix     1,000 mg 200 mL/hr over 60 Minutes Intravenous Every T-Th-Sa (Hemodialysis) 01/01/18 0922 01/02/18 0000   12/30/17 2200  piperacillin-tazobactam (ZOSYN) IVPB 3.375 g     3.375 g 12.5 mL/hr over 240 Minutes Intravenous Every 12 hours 12/30/17 1234     12/30/17 1315  vancomycin (VANCOCIN) 2,000 mg in sodium chloride 0.9 % 500 mL IVPB     2,000 mg 250 mL/hr over 120 Minutes Intravenous  Once 12/30/17 1231 12/30/17 1517   12/30/17 1230  vancomycin (VANCOCIN) IVPB 1000 mg/200 mL premix  Status:  Discontinued     1,000 mg 200 mL/hr over 60 Minutes Intravenous  Once 12/30/17 1225 12/30/17 1229   12/30/17 1230  piperacillin-tazobactam (ZOSYN) IVPB 3.375 g     3.375 g 100 mL/hr over 30 Minutes  Intravenous  Once 12/30/17 1225 12/30/17 1314         Objective:   Vitals:   01/03/18 0730 01/03/18 0745 01/03/18 0800 01/03/18 0815  BP: (!) 83/48 (!) 109/42 (!) 159/45 (!) 76/40  Pulse: (!) 55 (!) 59 80 83  Resp:      Temp:      TempSrc:      SpO2:      Weight:      Height:        Wt Readings from Last 3 Encounters:  01/03/18 91.5 kg (201 lb 11.5 oz)  12/02/17 90.1 kg (198 lb 10.2 oz)  10/25/17 95.3 kg (210 lb)     Intake/Output Summary (Last 24 hours) at 01/03/2018 0849 Last data filed at 01/03/2018 0600 Gross per 24 hour  Intake 1547.26 ml  Output 0 ml  Net 1547.26 ml     Physical Exam  Patient somnolent in dialysis bed, answers a few basic questions but unable to follow commands reliably, in no distress [place].AT,PERRAL Supple Neck,No JVD, No cervical lymphadenopathy appriciated.  Symmetrical Chest wall movement, Good air movement bilaterally, CTAB RRR,No Gallops, Rubs or new Murmurs, No Parasternal Heave +ve B.Sounds, Abd Soft, No tenderness,  No organomegaly appriciated, No rebound - guarding or rigidity. No Cyanosis, Clubbing or edema, No new Rash or bruise bilateral BKA, PEG in place    Data Review:    CBC Recent Labs  Lab 12/30/17 1242 12/30/17 1243 12/31/17 0213 01/01/18 0328 01/02/18 0404 01/03/18 0513  WBC 10.4  --  12.1* 11.7* 9.6 9.3  HGB 11.4* 12.9* 9.3* 9.2* 9.3* 8.8*  HCT 35.9* 38.0* 29.1* 28.9* 29.0* 27.7*  PLT 317  --  235 280 318 343  MCV 88.6  --  89.0 89.2 86.3 87.9  MCH 28.1  --  28.4 28.4 27.7 27.9  MCHC 31.8  --  32.0 31.8 32.1 31.8  RDW 15.9*  --  16.0* 15.9* 15.8* 16.1*  LYMPHSABS 0.8  --   --   --   --   --   MONOABS 1.0  --   --   --   --   --   EOSABS 0.0  --   --   --   --   --   BASOSABS 0.0  --   --   --   --   --     Chemistries  Recent Labs  Lab 12/30/17 1242 12/30/17 1243 12/31/17 0213 01/01/18 0328 01/02/18 0404 01/03/18 0720  NA 133* 131* 133* 133* 139 136  K 4.7 4.6 4.4 4.6 3.9 3.3*  CL 90* 94* 95*  94* 96* 97*  CO2 25  --  25 24 30 28   GLUCOSE 238* 240* 105* 179* 179* 191*  BUN 59* 54* 68* 88* 36* 66*  CREATININE 8.88* 8.20* 8.79* 10.07* 5.28* 6.92*  CALCIUM 9.3  --  8.7* 8.9 9.1 8.7*  MG  --   --  2.2 2.4 2.3  --   AST 19  --   --   --   --   --   ALT 14*  --   --   --   --   --   ALKPHOS 54  --   --   --   --   --   BILITOT 0.6  --   --   --   --   --    ------------------------------------------------------------------------------------------------------------------ No results for input(s): CHOL, HDL, LDLCALC, TRIG, CHOLHDL, LDLDIRECT in the last 72 hours.  Lab Results  Component Value Date   HGBA1C 6.8 10/13/2017   ------------------------------------------------------------------------------------------------------------------ No results for input(s): TSH, T4TOTAL, T3FREE, THYROIDAB in the last 72 hours.  Invalid input(s): FREET3 ------------------------------------------------------------------------------------------------------------------ No results for input(s): VITAMINB12, FOLATE, FERRITIN, TIBC, IRON, RETICCTPCT in the last 72 hours.  Coagulation profile Recent Labs  Lab 12/30/17 1449 12/31/17 2050 01/01/18 0328 01/02/18 0404 01/03/18 0513  INR 3.17 5.91* 7.03* 1.62 1.80    No results for input(s): DDIMER in the last 72 hours.  Cardiac Enzymes Recent Labs  Lab 12/30/17 1449 12/30/17 1815 12/31/17 0213  TROPONINI 0.05* 0.05* 0.06*   ------------------------------------------------------------------------------------------------------------------ No results found for: BNP  Micro Results Recent Results (from the past 240 hour(s))  Blood Culture (routine x 2)     Status: Abnormal   Collection Time: 12/30/17 12:54 PM  Result Value Ref Range Status   Specimen Description BLOOD SITE NOT SPECIFIED  Final   Special Requests   Final    BOTTLES DRAWN AEROBIC AND ANAEROBIC Blood Culture adequate volume   Culture  Setup Time   Final    GRAM POSITIVE  COCCI IN CLUSTERS AEROBIC BOTTLE ONLY CRITICAL RESULT CALLED TO, READ BACK BY AND VERIFIED WITH: PHARMD E MARTIN  [id number] MLM    Culture (A)  Final    STAPHYLOCOCCUS SPECIES (COAGULASE NEGATIVE) THE SIGNIFICANCE OF ISOLATING THIS ORGANISM FROM A SINGLE SET OF BLOOD CULTURES WHEN MULTIPLE SETS ARE DRAWN IS UNCERTAIN. PLEASE NOTIFY THE MICROBIOLOGY DEPARTMENT WITHIN ONE WEEK IF SPECIATION AND SENSITIVITIES ARE REQUIRED. Performed at [facility], [place] [address], [place], [place] [address]    Report Status 01/02/2018 FINAL  Final  Blood Culture ID Panel (Reflexed)     Status: Abnormal   Collection Time: 12/30/17 12:54 PM  Result Value Ref Range Status   Enterococcus species NOT DETECTED NOT DETECTED Final   Listeria monocytogenes NOT DETECTED NOT DETECTED Final   Staphylococcus species DETECTED (A) NOT DETECTED Final    Comment: Methicillin (oxacillin) resistant coagulase negative staphylococcus. Possible blood culture contaminant (unless isolated from more than one blood culture draw or clinical case suggests pathogenicity). No antibiotic treatment is indicated for blood  culture contaminants. CRITICAL RESULT CALLED TO, READ BACK BY AND VERIFIED WITH: PHARMD E MARTIN [id number] MLM    Staphylococcus aureus NOT DETECTED NOT DETECTED Final   Methicillin resistance DETECTED (A) NOT DETECTED Final    Comment: CRITICAL RESULT CALLED TO, READ BACK BY AND VERIFIED WITH: PHARMD E MARTIN [id number] MLM    Streptococcus species NOT DETECTED NOT DETECTED Final   Streptococcus agalactiae NOT DETECTED NOT DETECTED Final   Streptococcus pneumoniae NOT DETECTED NOT DETECTED Final   Streptococcus pyogenes NOT DETECTED NOT DETECTED Final   Acinetobacter baumannii NOT DETECTED NOT DETECTED Final   Enterobacteriaceae species NOT DETECTED NOT DETECTED Final   Enterobacter cloacae complex NOT DETECTED NOT DETECTED Final   Escherichia coli NOT DETECTED NOT DETECTED Final   Klebsiella oxytoca NOT  DETECTED NOT DETECTED Final   Klebsiella pneumoniae NOT DETECTED NOT DETECTED Final   Proteus species NOT DETECTED NOT DETECTED Final   Serratia marcescens NOT DETECTED NOT DETECTED Final   Haemophilus influenzae NOT DETECTED NOT DETECTED Final   Neisseria meningitidis NOT DETECTED NOT DETECTED Final   Pseudomonas aeruginosa NOT DETECTED NOT DETECTED Final   Candida albicans NOT DETECTED NOT DETECTED Final   Candida glabrata NOT DETECTED NOT DETECTED Final   Candida krusei NOT DETECTED NOT DETECTED Final   Candida parapsilosis NOT DETECTED NOT DETECTED Final   Candida tropicalis NOT DETECTED NOT DETECTED Final    Comment: Performed at [facility], [address], [place], [place] [address]  Blood Culture (routine x 2)     Status: None (Preliminary result)   Collection Time: 12/30/17 12:55 PM  Result Value Ref Range Status   Specimen Description BLOOD RIGHT ANTECUBITAL  Final   Special Requests   Final    BOTTLES DRAWN AEROBIC AND ANAEROBIC Blood Culture adequate volume   Culture   Final    NO GROWTH 3 DAYS Performed at [facility], [place] [address], [place], [place] [address]    Report Status PENDING  Incomplete  MRSA PCR Screening     Status: None   Collection Time: 12/30/17  3:16 PM  Result Value Ref Range Status   MRSA by PCR NEGATIVE NEGATIVE Final    Comment:        The GeneXpert MRSA Assay (FDA approved for NASAL specimens only), is one component of a comprehensive MRSA colonization surveillance program. It is not intended to diagnose MRSA infection nor to guide or monitor treatment for MRSA infections. Performed at [facility], [place] [address], [place], [place] [address]  C difficile quick scan w PCR reflex     Status: None   Collection Time: 01/01/18  8:09 AM  Result Value Ref Range Status   C Diff antigen NEGATIVE NEGATIVE Final   C Diff toxin NEGATIVE NEGATIVE Final   C Diff interpretation No C. difficile detected.  Final    Radiology  Reports Ct Abdomen Pelvis Wo Contrast  Result Date: 12/30/2017 CLINICAL DATA:  Fever and chills, hypotensive. Assess for abscess. History of end-stage renal disease on dialysis, EXAM: CT ABDOMEN AND PELVIS WITHOUT CONTRAST TECHNIQUE: Multidetector CT imaging of the abdomen and pelvis was performed following the standard protocol without IV contrast. COMPARISON:  CT abdomen and pelvis Dec 23, 2017 FINDINGS: Mild respiratory motion degraded examination. LOWER CHEST: Tree-in-bud infiltrates right lower lobe with patchy consolidation. Included heart size is normal. Pacemaker wires in place. HEPATOBILIARY: Punctate hepatic calcification. Otherwise unremarkable. Normal gallbladder. PANCREAS: Normal. SPLEEN: Normal. ADRENALS/URINARY TRACT: Kidneys are orthotopic, mildly atrophic. 12 mm probable cyst right interpolar kidney, unchanged. No nephrolithiasis, hydronephrosis; limited assessment for renal masses by nonenhanced CT. The unopacified ureters are normal in course and caliber. Urinary bladder is partially distended and unremarkable. Normal adrenal glands. STOMACH/BOWEL: Intraluminal gastrostomy tube. The stomach, small and large bowel are normal in course and caliber without inflammatory changes, mild amount of retained large bowel stool. Large bowel air-fluid levels. Mild colonic diverticulosis. Normal appendix. VASCULAR/LYMPHATIC: Aortoiliac vessels are normal in course and caliber. Moderate to severe calcific atherosclerosis. No lymphadenopathy by CT size criteria. REPRODUCTIVE: Mild prostatomegaly. OTHER: No intraperitoneal free fluid or free air. MUSCULOSKELETAL: Non-acute. Focal sclerosis bilateral femoral heads compatible with avascular necrosis without collapse. Advanced spondylosis with multilevel Schmorl's nodes and erosions, some which may be related to renal osteodystrophy/hyperparathyroidism. IMPRESSION: 1. Bibasilar bronchiolitis/pneumonia. 2. No abscess. 3. Mild amount of retained large bowel stool.  Colonic air-fluid levels seen with enteritis. Aortic Atherosclerosis (ICD10-I70.0). Electronically Signed   By: Elon Alas M.D.   On: 12/30/2017 22:05   Ct Abdomen Pelvis Wo Contrast  Result Date: 12/23/2017 CLINICAL DATA:  Unwitnessed fall.  Question she tube pulled out. EXAM: CT ABDOMEN AND PELVIS WITHOUT CONTRAST TECHNIQUE: Multidetector CT imaging of the abdomen and pelvis was performed following the standard protocol without IV contrast. COMPARISON:  Plain films 11/29/2017.  CT 05/06/2017. FINDINGS: Lower chest: Pacer wires noted in the right heart. Aortic calcifications. No aneurysm. Mild cardiomegaly. Dependent atelectasis or scarring. No effusions. Hepatobiliary: No focal hepatic abnormality. Questionable layering gallstones within the gallbladder. Pancreas: No focal abnormality or ductal dilatation. Spleen: No focal abnormality.  Normal size. Adrenals/Urinary Tract: Mildly atrophic kidneys bilaterally. No hydronephrosis. Urinary bladder is decompressed, grossly unremarkable. Adrenal glands unremarkable. Stomach/Bowel: No gastrostomy tube is seen within the stomach or in the abdominal wall. A tract is noted from the anterior abdominal wall, but no visible gastrostomy tube. Appendix is normal. No evidence of bowel obstruction. Large stool burden in the colon, particularly rectosigmoid colon, question fecal impaction. Vascular/Lymphatic: Diffuse aortic and iliac/branch vessel calcification. No evidence of aneurysm or adenopathy. Reproductive: No visible focal abnormality. Other: No free fluid or free air. Musculoskeletal: No acute bony abnormality. IMPRESSION: No visible gastrostomy tube within the stomach or anterior abdominal wall. Suspect layering gallstones. Mildly atrophic kidneys.  No hydronephrosis. Diffuse aortoiliac atherosclerosis. Large volume stool in the rectosigmoid colon, question fecal impaction. Electronically Signed   By: Rolm Baptise M.D.   On: 12/23/2017 08:54   Dg Chest 1  View  Result Date: 12/23/2017 CLINICAL DATA:  Pain following fall EXAM: CHEST  1 VIEW COMPARISON:  November 22, 2017 FINDINGS: Central catheter tip is in the right atrium. No pneumothorax. There is no edema or consolidation. Heart is mildly enlarged. Pacemaker leads are attached to the right atrium and right ventricle. No adenopathy. No bone lesions. IMPRESSION: Central catheter tip in right atrium without pneumothorax. No edema or consolidation. Stable cardiac silhouette. Stable pacemaker lead positioning. Electronically Signed   By: Lowella Grip III M.D.   On: 12/23/2017 08:44   Ct Head Wo Contrast  Result Date: 12/23/2017 CLINICAL DATA:  Unwitnessed fall.  Altered level of consciousness EXAM: CT HEAD WITHOUT CONTRAST CT CERVICAL SPINE WITHOUT CONTRAST TECHNIQUE: Multidetector CT imaging of the head and cervical spine was performed following the standard protocol without intravenous contrast. Multiplanar CT image reconstructions of the cervical spine were also generated. COMPARISON:  MRI 11/26/2017 FINDINGS: CT HEAD FINDINGS Brain: There is atrophy and chronic small vessel disease changes. No acute intracranial abnormality. Specifically, no hemorrhage, hydrocephalus, mass lesion, acute infarction, or significant intracranial injury. Vascular: No hyperdense vessel or unexpected calcification. Skull: No acute calvarial abnormality. Sinuses/Orbits: Visualized paranasal sinuses and mastoids clear. Orbital soft tissues unremarkable. Other: None CT CERVICAL SPINE FINDINGS Alignment: No subluxation Skull base and vertebrae: No fracture. Scattered mottled lucent areas throughout the vertebral bodies, most pronounced adjacent to the disc spaces, favor discogenic changes. Soft tissues and spinal canal: No prevertebral fluid or swelling. No visible canal hematoma. Disc levels: Diffuse degenerative disc changes with disc space narrowing and spurring. Upper chest: No acute findings Other: No acute findings.  Carotid  artery calcifications. IMPRESSION: No acute intracranial abnormality. Atrophy, chronic microvascular disease. Diffuse degenerative disc disease throughout the cervical spine. No fracture or subluxation. Electronically Signed   By: Rolm Baptise M.D.   On: 12/23/2017 08:59   Ct Cervical Spine Wo Contrast  Result Date: 12/23/2017 CLINICAL DATA:  Unwitnessed fall.  Altered level of consciousness EXAM: CT HEAD WITHOUT CONTRAST CT CERVICAL SPINE WITHOUT CONTRAST TECHNIQUE: Multidetector CT imaging of the head and cervical spine was performed following the standard protocol without intravenous contrast. Multiplanar CT image reconstructions of the cervical spine were also generated. COMPARISON:  MRI 11/26/2017 FINDINGS: CT HEAD FINDINGS Brain: There is atrophy and chronic small vessel disease changes. No acute intracranial abnormality. Specifically, no hemorrhage, hydrocephalus, mass lesion, acute infarction, or significant intracranial injury. Vascular: No hyperdense vessel or unexpected calcification. Skull: No acute calvarial abnormality. Sinuses/Orbits: Visualized paranasal sinuses and mastoids clear. Orbital soft tissues unremarkable. Other: None CT CERVICAL SPINE FINDINGS Alignment: No subluxation Skull base and vertebrae: No fracture. Scattered mottled lucent areas throughout the vertebral bodies, most pronounced adjacent to the disc spaces, favor discogenic changes. Soft tissues and spinal canal: No prevertebral fluid or swelling. No visible canal hematoma. Disc levels: Diffuse degenerative disc changes with disc space narrowing and spurring. Upper chest: No acute findings Other: No acute findings.  Carotid artery calcifications. IMPRESSION: No acute intracranial abnormality. Atrophy, chronic microvascular disease. Diffuse degenerative disc disease throughout the cervical spine. No fracture or subluxation. Electronically Signed   By: Rolm Baptise M.D.   On: 12/23/2017 08:59   Ir Replc Gastro/colonic Tube  Percut W/fluoro  Result Date: 12/23/2017 INDICATION: 67 year old male with a percutaneous gastrostomy tube which was placed 2 weeks ago at an outside institution. Unfortunately, his tube was inadvertently dislodged last night when he fell out of bed. He presents from the emergency room for attempted tube rescue. EXAM: GASTROSTOMY CATHETER REPLACEMENT MEDICATIONS: None ANESTHESIA/SEDATION: None CONTRAST:  15 mL Isovue-300-administered into the gastric  lumen. FLUOROSCOPY TIME:  Fluoroscopy Time: 0 minutes 36 seconds (4 mGy). COMPLICATIONS: None immediate. PROCEDURE: Informed written consent was obtained from the patient after a thorough discussion of the procedural risks, benefits and alternatives. All questions were addressed. A timeout was performed prior to the initiation of the procedure. An angled catheter was carefully navigated through the existing ostomy site. Using gentle hand injections of contrast material, the soft tissue tract from the skin exit site into the stomach was successfully identified and the catheter was navigated through the tract and into the stomach. A 0.035 wire was then coiled in the gastric fundus. The angled catheter was removed. An 6 French percutaneous balloon retention gastrostomy tube was lubricated in advanced over the wire into the stomach. The retention balloon was filled with 9 mL sterile saline and pulled snug against the anterior abdominal wall. The external bumper was affixed in place. A final hand injection of contrast material was performed under fluoroscopy confirming the tube location within the stomach. The tube was then flushed with saline. The patient tolerated the procedure well. IMPRESSION: Successful gastrostomy tube rescue with placement of a new 38 French balloon retention gastrostomy tube. Electronically Signed   By: Jacqulynn Cadet M.D.   On: 12/23/2017 17:43   Dg Chest Port 1 View  Result Date: 01/02/2018 CLINICAL DATA:  Shortness of breath and chest  pain for 1 day. EXAM: PORTABLE CHEST 1 VIEW COMPARISON:  12/30/2017 FINDINGS: Heart size is normal. Pacemaker and left jugular dual-lumen central venous catheter remain in appropriate position. Low lung volumes again seen. Mild scarring is again noted at the right lung base. No evidence of pulmonary infiltrate or pleural effusion. IMPRESSION: No active disease. Electronically Signed   By: Earle Gell M.D.   On: 01/02/2018 11:41   Dg Chest Port 1 View  Result Date: 12/30/2017 CLINICAL DATA:  Cough.  Altered mental status. EXAM: PORTABLE CHEST 1 VIEW COMPARISON:  12/23/2017. FINDINGS: Left jugular catheter tip in the right atrium. Stable right subclavian pacemaker leads. Normal sized heart. Clear lungs with normal vascularity. Thoracic spine degenerative changes. IMPRESSION: No acute abnormality. The left jugular catheter tip remains in the right atrium. Electronically Signed   By: Claudie Revering M.D.   On: 12/30/2017 12:50    Time Spent in minutes  30   Lala Lund M.D on 01/03/2018 at 8:49 AM  Between 7am to 7pm - Pager - [phone] ( page via [email or web address], text pages only, please mention full 10 digit call back number). After 7pm go to [email or web address] - password [email or web address]

## 2018-01-03 NOTE — Progress Notes (Signed)
ANTICOAGULATION CONSULT NOTE - Follow Up Consult  Pharmacy Consult for Heparin Indication: atrial fibrillation  Patient Measurements: Height: 5\' 10"  (177.8 cm) Weight: 201 lb 11.5 oz (91.5 kg) IBW/kg (Calculated) : 73 Heparin Dosing Weight: 89 kg  Labs: Recent Labs    01/01/18 0328 01/02/18 0404 01/02/18 1910 01/03/18 0513  HGB 9.2* 9.3*  --  8.8*  HCT 28.9* 29.0*  --  27.7*  PLT 280 318  --  343  LABPROT 60.1* 19.1*  --  20.8*  INR 7.03* 1.62  --  1.80  HEPARINUNFRC  --   --  <0.10* 0.55  CREATININE 10.07* 5.28*  --   --    ESRD  Assessment: 67 yr old male on Coumadin prior to admission for atrial fibrillation.  INR 3.17 on admit 6/6, Coumadin held, INR increased to 7.03 and 5mg  vit K given 6/8. INR 1.62 on 6/9. INR 1.80 today. Heparin level 0.55 (within goal). No bleeding noted. Hgb 8.8.     Goal of Therapy:  Heparin level 0.3-0.7 units/ml Monitor platelets by anticoagulation protocol: Yes   Plan:  Continue heparin at 1500 units/hr Confirmatory heparin level in 6 hours Daily Heparin level and CBC Monitor for bleeding, and PLTC  Jshon Ibe A. Levada Dy, PharmD, [place] Pager: [phone]  01/03/2018 8:06 AM

## 2018-01-03 NOTE — Procedures (Signed)
PRE-OPERATIVE DIAGNOSIS:   Bacteremia   POST-OPERATIVE DIAGNOSIS:  Bacteremia.  PROCEDURE:  Removal of left IJ perm cath.   PROVIDER:   Dagoberto Ligas PA-C.  ANESTHESIA:  10cc 1% lidocaine  The risks and benefits of this procedure were explained to the patient and his wife and the patient's wife signed the consent form.   OPERATIVE PROCEDURE:  The left side of patient's neck and chest were prepped and draped in standard fashion.  Local anesthesia was infiltrated over the tunneled catheter and its cuff.  The catheter was pulled with steady force, however the cuff appeared to be well incorporated.  An incision was then made over the cuff.  The cuff was then dissected free using a combination of blunt and sharp dissection.  The catheter was removed in its entirety, and hemostasis was achieved with local compression for about 10 minutes.  The skin was closed with a running 3-0 nylon suture.  The patient tolerated the procedure well, did not have any intraoperative complications.  RECOMMENDATIONS:   No obvious infection based on appearance of the catheter and the cuff was well incorporated.  Plan is for new [facility] placement later this week.

## 2018-01-03 NOTE — Progress Notes (Signed)
CRITICAL VALUE ALERT  Critical Value: Phosphorus 1.0 Date & Time Notied: 01/03/2018  0817  Provider Notified: Dr Candiss Norse   Orders Received/Actions taken: Dr Candiss Norse notified no orders given

## 2018-01-03 NOTE — H&P (View-Only) (Signed)
Hospital Consult    Reason for Consult:  Bacteremia, esrd Referring Physician:  Dr. Candiss Norse MRN #:  [id number]  History of Present Illness: This is a 67 y.o. male is here with recurrent fevers and has toxic encephalopathy found to have 1 of 2 blood cultures positive for coag negative staph.  He is on dialysis via a tunneled dialysis catheter in his left internal jugular vein.  In the past he has refused right upper extremity AV grafting.  Catheter was placed and dialysis center I do not have the timeframe.  Patient is confused at baseline.    Past Medical History:  Diagnosis Date  . Anemia   . Antral ulcer 2014   small  . BENIGN PROSTATIC HYPERTROPHY   . Bipolar disorder ([place])    "sometimes" (10/07/2016)  . CHOLELITHIASIS   . Chronic combined systolic and diastolic CHF (congestive heart failure) ([place])   . Complication of anesthesia    wife states pt had trouble waking up with in Nov., 2014  . CVA (cerebral vascular accident) ([place]) 07/2007   No residual effect  . DEPRESSION   . DIABETES MELLITUS, TYPE II    diet control.  No medication  since November 2015  . ERECTILE DYSFUNCTION   . ESRD on hemodialysis ([place])    ESRD due to DM/HTN. .  HD TTS at [facility] on [place].  [name] GERD   . GI bleed    due to gastritis, discharged 10/02/16/notes 10/07/2016  . Hepatitis C    C - has been treated  . History of Clostridium difficile   . History of kidney stones   . HYPERTENSION   . LBBB (left bundle branch block)   . Morbid obesity ([place])   . PAF (paroxysmal atrial fibrillation) ([place])    a. Dx 12/2015.    Past Surgical History:  Procedure Laterality Date  . AMPUTATION Left 05/12/2013   Procedure: AMPUTATION RAY;  Surgeon: Newt Minion, MD;  Location: [place];  Service: Orthopedics;  Laterality: Left;  Left Foot 1st Ray Amputation  . AMPUTATION Left 06/09/2013   Procedure: AMPUTATION BELOW KNEE;  Surgeon: Newt Minion, MD;  Location: [place];  Service: Orthopedics;  Laterality: Left;   Left Below Knee Amputation and removal proximal screws IM tibial nail  . AMPUTATION Right 09/08/2013   Procedure: AMPUTATION BELOW KNEE;  Surgeon: Newt Minion, MD;  Location: [place];  Service: Orthopedics;  Laterality: Right;  Right Below Knee Amputation  . AMPUTATION Right 10/11/2013   Procedure: AMPUTATION BELOW KNEE;  Surgeon: Newt Minion, MD;  Location: [place];  Service: Orthopedics;  Laterality: Right;  Right Below Knee Amputation Revision  . AV FISTULA PLACEMENT  06/14/2012   Procedure: ARTERIOVENOUS (AV) FISTULA CREATION;  Surgeon: Angelia Mould, MD;  Location: [facility] OR;  Service: Vascular;  Laterality: Left;  Left basilic vein transposition with fistula.  . AV FISTULA PLACEMENT Left 07/21/2017   Procedure: Attempt INSERTION OF ARTERIOVENOUS (AV) WITH  ARTEGRAFT Left  UPPER ARM;  Surgeon: Conrad Mooresburg, MD;  Location: [place];  Service: Vascular;  Laterality: Left;  . BASCILIC VEIN TRANSPOSITION Left 02/17/2017   Procedure: LEFT 1ST STAGE BRACHIAL VEIN TRANSPOSITION;  Surgeon: Conrad Kingsland, MD;  Location: [place];  Service: Vascular;  Laterality: Left;  . BASCILIC VEIN TRANSPOSITION Left 04/21/2017   Procedure: LEFT 2ND STAGE BRACHIAL VEIN TRANSPOSITION;  Surgeon: Conrad , MD;  Location: [place];  Service: Vascular;  Laterality: Left;  . COLONOSCOPY  N/A 10/28/2012   Procedure: COLONOSCOPY;  Surgeon: Jeryl [place], MD;  Location: [facility] ENDOSCOPY;  Service: Endoscopy;  Laterality: N/A;  . COLONOSCOPY N/A 11/02/2012   Procedure: COLONOSCOPY;  Surgeon: Cleotis Nipper, MD;  Location: [facility] ENDOSCOPY;  Service: Endoscopy;  Laterality: N/A;  . COLONOSCOPY N/A 11/03/2012   Procedure: COLONOSCOPY;  Surgeon: Cleotis Nipper, MD;  Location: [facility] ENDOSCOPY;  Service: Endoscopy;  Laterality: N/A;  . COLONOSCOPY N/A 09/16/2016   Procedure: COLONOSCOPY;  Surgeon: Jerene Bears, MD;  Location: [facility] ENDOSCOPY;  Service: Gastroenterology;  Laterality: N/A;  . ENTEROSCOPY N/A 11/08/2012   Procedure: ENTEROSCOPY;   Surgeon: Wonda Horner, MD;  Location: [facility] ENDOSCOPY;  Service: Endoscopy;  Laterality: N/A;  . ESOPHAGOGASTRODUODENOSCOPY N/A 11/02/2012   Procedure: ESOPHAGOGASTRODUODENOSCOPY (EGD);  Surgeon: Cleotis Nipper, MD;  Location: [facility] ENDOSCOPY;  Service: Endoscopy;  Laterality: N/A;  . ESOPHAGOGASTRODUODENOSCOPY (EGD) WITH PROPOFOL N/A 09/16/2016   Procedure: ESOPHAGOGASTRODUODENOSCOPY (EGD) WITH PROPOFOL;  Surgeon: Jerene Bears, MD;  Location: [facility] ENDOSCOPY;  Service: Gastroenterology;  Laterality: N/A;  . EXCHANGE OF A DIALYSIS CATHETER Left 11/13/2016   Procedure: EXCHANGE OF A DIALYSIS CATHETER;  Surgeon: Elam Dutch, MD;  Location: [place];  Service: Vascular;  Laterality: Left;  . EYE SURGERY Left    to remove scar tissue  . GIVENS CAPSULE STUDY N/A 11/04/2012   Procedure: GIVENS CAPSULE STUDY;  Surgeon: Cleotis Nipper, MD;  Location: [facility] ENDOSCOPY;  Service: Endoscopy;  Laterality: N/A;  . GIVENS CAPSULE STUDY N/A 09/29/2016   Procedure: GIVENS CAPSULE STUDY;  Surgeon: Manus Gunning, MD;  Location: [place];  Service: Gastroenterology;  Laterality: N/A;  try to keep pt up in bedside chair as much as possible during the study.    [name] HARDWARE REMOVAL Left 06/09/2013   Procedure: HARDWARE REMOVAL;  Surgeon: Newt Minion, MD;  Location: [place];  Service: Orthopedics;  Laterality: Left;  Left Below Knee Amputation  and Removal proximal screws IM tibial nail  . HEMATOMA EVACUATION Left 04/29/2017   Procedure: EVACUATION HEMATOMA LEFT ARM;  Surgeon: Conrad Bingen, MD;  Location: [place];  Service: Vascular;  Laterality: Left;  . INSERTION OF DIALYSIS CATHETER N/A 11/09/2016   Procedure: INSERTION OF TEMPORARY DIALYSIS CATHETER;  Surgeon: Elam Dutch, MD;  Location: [place];  Service: Vascular;  Laterality: N/A;  . IR GENERIC HISTORICAL  09/27/2016   IR US GUIDE VASC ACCESS RIGHT 09/27/2016 Aletta Edouard, MD [facility]-INTERV RAD  . IR GENERIC HISTORICAL  09/27/2016   IR FLUORO GUIDE CV LINE RIGHT  09/27/2016 Aletta Edouard, MD [facility]INTER[facility]  . IR [place] PERCUT W/FLUORO  12/23/2017  . LIGATION OF ARTERIOVENOUS  FISTULA Left 11/09/2016   Procedure: LIGATION OF ARTERIOVENOUS  FISTULA;  Surgeon: Elam Dutch, MD;  Location: [place];  Service: Vascular;  Laterality: Left;  . ORIF FIBULA FRACTURE Left 09/09/2012   Procedure: OPEN REDUCTION INTERNAL FIXATION (ORIF) FIBULA FRACTURE;  Surgeon: Johnny Bridge, MD;  Location: [place];  Service: Orthopedics;  Laterality: Left;  . PACEMAKER IMPLANT N/A 04/30/2017   Procedure: PACEMAKER IMPLANT;  Surgeon: Evans Lance, MD;  Location: [place] CV LAB;  Service: Cardiovascular;  Laterality: N/A;  . TEMPORARY PACEMAKER N/A 04/29/2017   Procedure: TEMPORARY PACEMAKER;  Surgeon: Lorretta Harp, MD;  Location: [place] CV LAB;  Service: Cardiovascular;  Laterality: N/A;  . TIBIA IM NAIL INSERTION Left 09/09/2012   Procedure: INTRAMEDULLARY (IM) NAIL TIBIAL;  Surgeon: Johnny Bridge, MD;  Location: [place];  Service: Orthopedics;  Laterality: Left;  left tibial nail and open reduction internal fixation left fibula fracture  . UPPER EXTREMITY VENOGRAPHY N/A 12/14/2016   Procedure: Bilateral Upper Extremity Venography and Central Venography;  Surgeon: Conrad Luther, MD;  Location: [place] CV LAB;  Service: Cardiovascular;  Laterality: N/A;    Allergies  Allergen Reactions  . Ceftriaxone Sodium In Dextrose Anaphylaxis    Tolerates Zosyn  . Ciprofloxacin Other (See Comments)    UNSPECIFIED PAIN  . Nsaids Other (See Comments)    HISTORY OF ULCER  . Penicillins Swelling    Tolerates Zosyn [ PATIENT WITH HX OF ANAPHYLAXIS TO CEFTRIAXONE ] 11/26/17:  pt has received 3 doses of zosyn in past (04/2017.) No allergy tor adverse event to zosyn noted at that time.     . Sulfa Antibiotics Swelling    SWELLING REACTION UNSPECIFIED     Prior to Admission medications   Medication Sig Start Date End Date Taking? Authorizing Provider    acetaminophen (TYLENOL) 325 MG tablet Take 650 mg by mouth every 8 (eight) hours. Do not exceed 4 GMS of Tylenol  In 24 hours   Yes [provider]  Amino Acids-Protein Hydrolys (FEEDING SUPPLEMENT, PRO-STAT SUGAR FREE 64,) LIQD Place 30 mLs into feeding tube 2 (two) times daily. Patient taking differently: Place 30 mLs into feeding tube every 12 (twelve) hours.  12/02/17  Yes Lavina Hamman, MD  amiodarone (PACERONE) 200 MG tablet Place 1 tablet (200 mg total) into feeding tube daily. 12/03/17  Yes Lavina Hamman, MD  atorvastatin (LIPITOR) 40 MG tablet Place 1 tablet (40 mg total) into feeding tube at bedtime. 12/02/17  Yes Lavina Hamman, MD  escitalopram (LEXAPRO) 10 MG tablet Place 10 mg into feeding tube daily.    Yes [provider]  famotidine (PEPCID) 20 MG tablet Place 20 mg into feeding tube at bedtime.    Yes [provider]  insulin lispro (HUMALOG) 100 UNIT/ML injection Inject 100 Units into the skin every 6 (six) hours. Sliding Scale Insulin as follows: 201-250 = 2 units 251-300 = 4 units 301-350 = 6 units 351-400 = 8 units 401-540  = 10 units Above 450 give 12 units and recheck in 2 hours  If check blood sugar glucose still above 450  Notify MD Notify MD if checked blood glucose  Under 60)   Yes [provider]  latanoprost (XALATAN) 0.005 % ophthalmic solution Place 1 drop into both eyes at bedtime.  06/12/16  Yes [provider]  midodrine (PROAMATINE) 5 MG tablet Place 1 tablet (5 mg total) into feeding tube 2 (two) times daily with a meal. Patient taking differently: Place 5 mg into feeding tube every 12 (twelve) hours.  12/02/17  Yes Lavina Hamman, MD  Nutritional Supplements (FEEDING SUPPLEMENT, NEPRO CARB STEADY,) LIQD Place 1,000 mLs into feeding tube daily. Patient taking differently: Place 1,000 mLs into feeding tube daily. Nepro 55 ml/hr 12/02/17  Yes Lavina Hamman, MD  sevelamer carbonate (RENVELA) 800 MG tablet Place 1,600  mg into feeding tube 2 (two) times daily.   Yes [provider]  sorbitol 70 % SOLN Take 30 mLs by mouth 2 (two) times daily. Patient taking differently: Place 30 mLs into feeding tube every 12 (twelve) hours.  05/09/17  Yes Nita Sells, MD  warfarin (COUMADIN) 5 MG tablet Place 5.5 mg into feeding tube one time only at 6 PM.   Yes [provider]  acetaminophen (TYLENOL) 160 MG/5ML solution Place 20.3 mLs (650 mg total) into feeding tube every 6 (six) hours as needed for headache or fever. Patient not taking: Reported on 12/30/2017 12/02/17   Lavina Hamman, MD  famotidine (PEPCID) 40 MG/5ML suspension Place 2.5 mLs (20 mg total) into feeding tube daily. Patient not taking: Reported on 12/30/2017 12/03/17   Lavina Hamman, MD    Social History   Socioeconomic History  . Marital status: Married    Spouse name: Not on file  . Number of children: 2  . Years of education: 44  . Highest education level: Not on file  Occupational History  . Occupation: disabled due to stroke    Employer: RETIRED  Social Needs  . Financial resource strain: Not on file  . Food insecurity:    Worry: Not on file    Inability: Not on file  . Transportation needs:    Medical: Not on file    Non-medical: Not on file  Tobacco Use  . Smoking status: Never Smoker  . Smokeless tobacco: Never Used  Substance and Sexual Activity  . Alcohol use: No  . Drug use: No  . Sexual activity: Yes    Birth control/protection: None  Lifestyle  . Physical activity:    Days per week: Not on file    Minutes per session: Not on file  . Stress: Not on file  Relationships  . Social connections:    Talks on phone: Not on file    Gets together: Not on file    Attends religious service: Not on file    Active member of club or organization: Not on file    Attends meetings of clubs or organizations: Not on file    Relationship status: Not on file  . Intimate partner violence:    Fear of current or ex  partner: Not on file    Emotionally abused: Not on file    Physically abused: Not on file    Forced sexual activity: Not on file  Other Topics Concern  . 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 Diabetes Mother   . Hypertension Mother   . Heart attack Father   . Hypertension Father   . Coronary artery disease Other     ROS: Cannot be obtained due to patient confusion  Physical Examination  Vitals:   01/03/18 1317 01/03/18 1514  BP:    Pulse:    Resp:    Temp: (!) 101.3 F (38.5 C) 100.1 F (37.8 C)  SpO2:     Body mass index is 28.88 kg/m.  General:  nad HENT: WNL, normocephalic Pulmonary: normal non-labored breathing  Cardiac: palpable radial pulses Abdomen: soft Musculoskeletal: Left IJ tunneled catheter without overt signs of infection. Neurologic: Confused at baseline  CBC    Component Value Date/Time   WBC 9.3 01/03/2018 0513   RBC 3.15 (L) 01/03/2018 0513   HGB 8.8 (L) 01/03/2018 0513   HCT 27.7 (L) 01/03/2018 0513   PLT 343 01/03/2018 0513   MCV 87.9 01/03/2018 0513   MCH 27.9 01/03/2018 0513   MCHC 31.8 01/03/2018 0513   RDW 16.1 (H) 01/03/2018 0513   LYMPHSABS 0.8 12/30/2017 1242   MONOABS 1.0 12/30/2017 1242   EOSABS 0.0 12/30/2017 1242   BASOSABS 0.0 12/30/2017 1242  BMET    Component Value Date/Time   NA 136 01/03/2018 0720   K 3.3 (L) 01/03/2018 0720   CL 97 (L) 01/03/2018 0720   CO2 28 01/03/2018 0720   GLUCOSE 191 (H) 01/03/2018 0720   BUN 66 (H) 01/03/2018 0720   CREATININE 6.92 (H) 01/03/2018 0720   CREATININE 8.97 (H) 10/07/2015 1500   CALCIUM 8.7 (L) 01/03/2018 0720   GFRNONAA 7 (L) 01/03/2018 0720   GFRNONAA 6 (L) 10/07/2015 1500   GFRAA 9 (L) 01/03/2018 0720   GFRAA 6 (L) 10/07/2015 1500    COAGS: Lab Results  Component Value Date   INR 1.80 01/03/2018   INR 1.62 01/02/2018    INR 7.03 (HH) 01/01/2018      ASSESSMENT/PLAN: This is a 67 y.o. male with end-stage renal disease on dialysis via left internal jugular catheter.  Plan is to remove catheter give him a holiday and plan replacement in the next couple days.    Finis Hendricksen C. Donzetta Matters, MD [facility] Office: [phone] Pager: [phone]

## 2018-01-03 NOTE — Consult Note (Signed)
Consultation Note Date: 01/03/2018   Patient Name: Oscar Castillo  DOB: 08/31/1950  MRN: [id number]  Age / Sex: 67 y.o., male  PCP: [name], MD Referring Physician: Thurnell Lose, MD  Reason for Consultation: Establishing goals of care   Clinical Assessment and Goals of Care: Patient is resting in bed on his side. He makes mumbling noises at times, but does not speak. His wife is at bedside. She states they have been married 8 years, and he has 2 daughters with his first wife, both of which live out of town. One of the daughters is mentally disabled and lives with his ex-wife. The other she states she is keeping updated. She states her husband is a private man and did not tell his family or friends when he started dialysis.   She states he has been in ESRD receiving dialysis since 2014. He had LLE amputation 05/2013 and RLE amputation 09/2013. She tells me on 4/29 he became ill and she brought him to the hospital. He had an altered mental status. He was treated and a PEG tube was placed. He transitioned to a facility. He returned 5/30 for PEG tube dislodgement. He returned this hospitalization for fever and chills.  She states prior to 4/29, he was active with prosthetics. She states he enjoyed watching t.v. He was developing early dementia. She helped him with bathing. No issues at that time with oral intake.  We discussed his diagnosis, prognosis, and GOC.  She states he does not have advanced directives. She states when he has been able to communicate, people have asking him about a POA. He has told them he does not need a POA and can make his own decisions. She states he has told providers in the past he wants everything done possible, and when asked about code status, he has advised he wants resuscitative efforts. Discussed quality of life and inquired as to if there would be a situation or circumstance  that would cause him to want to focus on comfort. She said "no".        SUMMARY OF RECOMMENDATIONS    Continue current care. Will continue to follow.   Code Status/Advance Care Planning:  Full code    Symptom Management:   Per primary team.   Palliative Prophylaxis:   Eye Care and Oral Care   Prognosis:   Poor longterm.   Discharge Planning: To Be Determined      Primary Diagnoses: Present on Admission: . Severe sepsis ([place]) . Anemia due to stage 5 chronic kidney disease ([place]) . DM (diabetes mellitus) with peripheral vascular complication ([place]) . Essential (primary) hypertension . Paroxysmal atrial fibrillation (HCC)   I have reviewed the medical record, interviewed the patient and family, and examined the patient. The following aspects are pertinent.  Past Medical History:  Diagnosis Date  . Anemia   . Antral ulcer 2014   small  . BENIGN PROSTATIC HYPERTROPHY   . Bipolar disorder ([place])    "sometimes" (10/07/2016)  . CHOLELITHIASIS   .  Chronic combined systolic and diastolic CHF (congestive heart failure) ([place])   . Complication of anesthesia    wife states pt had trouble waking up with in Nov., 2014  . CVA (cerebral vascular accident) ([place]) 07/2007   No residual effect  . DEPRESSION   . DIABETES MELLITUS, TYPE II    diet control.  No medication  since November 2015  . ERECTILE DYSFUNCTION   . ESRD on hemodialysis ([place])    ESRD due to DM/HTN. .  HD TTS at [facility] on [address].  [name] GERD   . GI bleed    due to gastritis, discharged 10/02/16/notes 10/07/2016  . Hepatitis C    C - has been treated  . History of Clostridium difficile   . History of kidney stones   . HYPERTENSION   . LBBB (left bundle branch block)   . Morbid obesity ([place])   . PAF (paroxysmal atrial fibrillation) ([place])    a. Dx 12/2015.   Social History   Socioeconomic History  . Marital status: Married    Spouse name: Not on file  . Number of children: 2  . Years of  education: 50  . Highest education level: Not on file  Occupational History  . Occupation: disabled due to stroke    Employer: RETIRED  Social Needs  . Financial resource strain: Not on file  . Food insecurity:    Worry: Not on file    Inability: Not on file  . Transportation needs:    Medical: Not on file    Non-medical: Not on file  Tobacco Use  . Smoking status: Never Smoker  . Smokeless tobacco: Never Used  Substance and Sexual Activity  . Alcohol use: No  . Drug use: No  . Sexual activity: Yes    Birth control/protection: None  Lifestyle  . Physical activity:    Days per week: Not on file    Minutes per session: Not on file  . Stress: Not on file  Relationships  . Social connections:    Talks on phone: Not on file    Gets together: Not on file    Attends religious service: Not on file    Active member of club or organization: Not on file    Attends meetings of clubs or organizations: Not on file    Relationship status: Not on file  Other Topics Concern  . 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 Diabetes Mother   . Hypertension Mother   . Heart attack Father   . Hypertension Father   . Coronary artery disease Other    Scheduled Meds: . amiodarone  200 mg Per Tube Daily  . atorvastatin  40 mg Per Tube QHS  . Chlorhexidine Gluconate Cloth  6 each Topical Q0600  . [START ON 01/11/2018] darbepoetin (ARANESP) injection - DIALYSIS  60 mcg Intravenous Q Tue-HD  . escitalopram  10 mg Per Tube Daily  . famotidine  20 mg Oral QHS  . feeding supplement (NEPRO CARB STEADY)  1,000 mL Per Tube Q24H  . feeding supplement (PRO-STAT SUGAR FREE 64)  30 mL Per Tube TID  . insulin aspart  0-9 Units Subcutaneous Q4H  . latanoprost  1 drop Both Eyes QHS  . mouth rinse  15 mL Mouth Rinse BID  . midodrine  5  mg Per Tube BID WC  . sevelamer  carbonate  1,600 mg Per Tube BID WC  . Warfarin - Pharmacist Dosing Inpatient   Does not apply q1800   Continuous Infusions: . sodium chloride Stopped (01/02/18 1800)  . heparin 1,500 Units/hr (01/02/18 2205)  . piperacillin-tazobactam (ZOSYN)  IV Stopped (01/03/18 0134)  . [START ON 01/06/2018] vancomycin     PRN Meds:.sodium chloride, lidocaine Medications Prior to Admission:  Prior to Admission medications   Medication Sig Start Date End Date Taking? Authorizing Provider  acetaminophen (TYLENOL) 325 MG tablet Take 650 mg by mouth every 8 (eight) hours. Do not exceed 4 GMS of Tylenol  In 24 hours   Yes [provider]  Amino Acids-Protein Hydrolys (FEEDING SUPPLEMENT, PRO-STAT SUGAR FREE 64,) LIQD Place 30 mLs into feeding tube 2 (two) times daily. Patient taking differently: Place 30 mLs into feeding tube every 12 (twelve) hours.  12/02/17  Yes Lavina Hamman, MD  amiodarone (PACERONE) 200 MG tablet Place 1 tablet (200 mg total) into feeding tube daily. 12/03/17  Yes Lavina Hamman, MD  atorvastatin (LIPITOR) 40 MG tablet Place 1 tablet (40 mg total) into feeding tube at bedtime. 12/02/17  Yes Lavina Hamman, MD  escitalopram (LEXAPRO) 10 MG tablet Place 10 mg into feeding tube daily.    Yes [provider]  famotidine (PEPCID) 20 MG tablet Place 20 mg into feeding tube at bedtime.    Yes [provider]  insulin lispro (HUMALOG) 100 UNIT/ML injection Inject 100 Units into the skin every 6 (six) hours. Sliding Scale Insulin as follows: 201-250 = 2 units 251-300 = 4 units 301-350 = 6 units 351-400 = 8 units 401-540  = 10 units Above 450 give 12 units and recheck in 2 hours  If check blood sugar glucose still above 450  Notify MD Notify MD if checked blood glucose  Under 60)   Yes [provider]  latanoprost (XALATAN) 0.005 % ophthalmic solution Place 1 drop into both eyes at bedtime.  06/12/16  Yes [provider]  midodrine (PROAMATINE) 5 MG  tablet Place 1 tablet (5 mg total) into feeding tube 2 (two) times daily with a meal. Patient taking differently: Place 5 mg into feeding tube every 12 (twelve) hours.  12/02/17  Yes Lavina Hamman, MD  Nutritional Supplements (FEEDING SUPPLEMENT, NEPRO CARB STEADY,) LIQD Place 1,000 mLs into feeding tube daily. Patient taking differently: Place 1,000 mLs into feeding tube daily. Nepro 55 ml/hr 12/02/17  Yes Lavina Hamman, MD  sevelamer carbonate (RENVELA) 800 MG tablet Place 1,600 mg into feeding tube 2 (two) times daily.   Yes [provider]  sorbitol 70 % SOLN Take 30 mLs by mouth 2 (two) times daily. Patient taking differently: Place 30 mLs into feeding tube every 12 (twelve) hours.  05/09/17  Yes Nita Sells, MD  warfarin (COUMADIN) 5 MG tablet Place 5.5 mg into feeding tube one time only at 6 PM.   Yes [provider]  acetaminophen (TYLENOL) 160 MG/5ML solution Place 20.3 mLs (650 mg total) into feeding tube every 6 (six) hours as needed for headache or fever. Patient not taking: Reported on 12/30/2017 12/02/17   Lavina Hamman, MD  famotidine (PEPCID) 40 MG/5ML suspension Place 2.5 mLs (20 mg total) into feeding tube daily. Patient not taking: Reported on 12/30/2017 12/03/17   Lavina Hamman, MD   Allergies  Allergen Reactions  . Ceftriaxone Sodium In Dextrose Anaphylaxis    Tolerates Zosyn  .  Ciprofloxacin Other (See Comments)    UNSPECIFIED PAIN  . Nsaids Other (See Comments)    HISTORY OF ULCER  . Penicillins Swelling    Tolerates Zosyn [ PATIENT WITH HX OF ANAPHYLAXIS TO CEFTRIAXONE ] 11/26/17:  pt has received 3 doses of zosyn in past (04/2017.) No allergy tor adverse event to zosyn noted at that time.     . Sulfa Antibiotics Swelling    SWELLING REACTION UNSPECIFIED    Review of Systems  Unable to perform ROS   Physical Exam  Constitutional: No distress.  Pulmonary/Chest: Effort normal.    Vital Signs: BP 127/67   Pulse 78   Temp 98.8 F (37.1  C) (Axillary)   Resp (!) 22   Ht 5\' 10"  (1.778 m)   Wt 91.3 kg (201 lb 4.5 oz)   SpO2 98%   BMI 28.88 kg/m  Pain Scale: Faces   Pain Score: Asleep   SpO2: SpO2: 98 % O2 Device:SpO2: 98 % O2 Flow Rate: .O2 Flow Rate (L/min): 2 L/min  IO: Intake/output summary:   Intake/Output Summary (Last 24 hours) at 01/03/2018 1225 Last data filed at 01/03/2018 1112 Gross per 24 hour  Intake 1507.26 ml  Output 725 ml  Net 782.26 ml    LBM: Last BM Date: 01/03/18 Baseline Weight: Weight: 87.5 kg (193 lb) Most recent weight: Weight: 91.3 kg (201 lb 4.5 oz)     Palliative Assessment/Data: PEG.     Time In: 11:10 Time Out: 12:20 Time Total: 70 min Greater than 50%  of this time was spent counseling and coordinating care related to the above assessment and plan.  Signed by: Asencion Gowda, NP   Please contact Palliative Medicine Team phone at [phone] for questions and concerns.  For individual provider: See Shea Evans

## 2018-01-03 NOTE — Progress Notes (Signed)
Pt temp 101.3  Dr. Candiss Norse notified verbal order given for 650 mg  tylenol per peg tube Q 6, and blood cultures x2. Pt given tylenol 650 mg will continue to monitor

## 2018-01-03 NOTE — Progress Notes (Signed)
ANTICOAGULATION CONSULT NOTE - Follow Up Consult  Pharmacy Consult for Heparin Indication: atrial fibrillation  Allergies  Allergen Reactions  . Ceftriaxone Sodium In Dextrose Anaphylaxis    Tolerates Zosyn  . Ciprofloxacin Other (See Comments)    UNSPECIFIED PAIN  . Nsaids Other (See Comments)    HISTORY OF ULCER  . Penicillins Swelling    Tolerates Zosyn [ PATIENT WITH HX OF ANAPHYLAXIS TO CEFTRIAXONE ] 11/26/17:  pt has received 3 doses of zosyn in past (04/2017.) No allergy tor adverse event to zosyn noted at that time.     . Sulfa Antibiotics Swelling    SWELLING REACTION UNSPECIFIED     Patient Measurements: Height: 5\' 10"  (177.8 cm) Weight: 201 lb 4.5 oz (91.3 kg) IBW/kg (Calculated) : 73 Heparin Dosing Weight: 89 kg  Vital Signs: Temp: 99 F (37.2 C) (06/10 1600) Temp Source: Oral (06/10 1600) BP: 103/52 (06/10 1600) Pulse Rate: 78 (06/10 1112)  Labs: Recent Labs    01/01/18 0328 01/02/18 0404 01/02/18 1910 01/03/18 0513 01/03/18 0720 01/03/18 1352  HGB 9.2* 9.3*  --  8.8*  --   --   HCT 28.9* 29.0*  --  27.7*  --   --   PLT 280 318  --  343  --   --   LABPROT 60.1* 19.1*  --  20.8*  --   --   INR 7.03* 1.62  --  1.80  --   --   HEPARINUNFRC  --   --  <0.10* 0.55  --  0.67  CREATININE 10.07* 5.28*  --   --  6.92*  --     Estimated Creatinine Clearance: 11.9 mL/min (A) (by C-G formula based on SCr of 6.92 mg/dL (H)).   Medications:  Scheduled:  . amiodarone  200 mg Per Tube Daily  . atorvastatin  40 mg Per Tube QHS  . Chlorhexidine Gluconate Cloth  6 each Topical Q0600  . [START ON 01/11/2018] darbepoetin (ARANESP) injection - DIALYSIS  60 mcg Intravenous Q Tue-HD  . escitalopram  10 mg Per Tube Daily  . famotidine  20 mg Oral QHS  . feeding supplement (NEPRO CARB STEADY)  1,000 mL Per Tube Q24H  . feeding supplement (PRO-STAT SUGAR FREE 64)  30 mL Per Tube TID  . insulin aspart  0-9 Units Subcutaneous Q4H  . latanoprost  1 drop Both Eyes QHS  .  mouth rinse  15 mL Mouth Rinse BID  . midodrine  5 mg Per Tube BID WC  . sevelamer carbonate  1,600 mg Per Tube BID WC  . Warfarin - Pharmacist Dosing Inpatient   Does not apply q1800   Infusions:  . sodium chloride Stopped (01/02/18 1800)  . heparin 1,500 Units/hr (01/03/18 1405)  . [START ON 01/06/2018] vancomycin      Assessment: 67 yo M on Coumadin PTA for afib / hx CVA.   INR 3.17 on admit, rose to 7.03, then <2 after Vit K given.  Hep bridge initiated.  Coumadin on hold pending temp HD cath placement (possibly Thursday 6/13).  Heparin level 0.67 on 1500 units/hr (trending up).  INR 1.8  Goal of Therapy:  INR 2-3 Heparin level 0.3-0.7 units/ml Monitor platelets by anticoagulation protocol: Yes   Plan:  Reduce heparin to 1400 units/hr. Daily heparin level and CBC Continue to hold Coumadin until after line placement.  [employer], Pharm.D., BCPS Clinical Pharmacist  01/03/2018 4:19 PM

## 2018-01-03 NOTE — Consult Note (Signed)
[place] Nurse wound consult note Reason for Consult:BIlateral BKA.  Has device related pressure injuries to bilateral lower legs from prosthesis.  Will cover with silicone foam dressing to pad and protect. Palliative consult pending Wound type:Device related pressure injury, full thickness, unstageable due to scabbing.  Pressure Injury POA: Yes Measurement: 1 cm x 1 cm scabbed lesion Wound ZWC:HENIDPOE Drainage (amount, consistency, odor) none noted Periwound:Bilateral BKA Dressing procedure/placement/frequency:Cleanse wounds to bilateral lower legs with soap and water and pat dry.  SIlicone foam dressing to pad and protect. Change every three days and PRN soilage.   Will not follow at this time.  Please re-consult if needed.  Domenic Moras RN BSN [place] Pager [phone]

## 2018-01-04 ENCOUNTER — Telehealth: Payer: Self-pay | Admitting: Vascular Surgery

## 2018-01-04 LAB — CBC
HEMATOCRIT: 27.7 % — AB (ref 39.0–52.0)
Hemoglobin: 8.7 g/dL — ABNORMAL LOW (ref 13.0–17.0)
MCH: 27.6 pg (ref 26.0–34.0)
MCHC: 31.4 g/dL (ref 30.0–36.0)
MCV: 87.9 fL (ref 78.0–100.0)
Platelets: 374 10*3/uL (ref 150–400)
RBC: 3.15 MIL/uL — ABNORMAL LOW (ref 4.22–5.81)
RDW: 16.2 % — ABNORMAL HIGH (ref 11.5–15.5)
WBC: 9 10*3/uL (ref 4.0–10.5)

## 2018-01-04 LAB — CULTURE, BLOOD (ROUTINE X 2)
Culture: NO GROWTH
SPECIAL REQUESTS: ADEQUATE

## 2018-01-04 LAB — PROTIME-INR
INR: 2.45
Prothrombin Time: 26.4 s — ABNORMAL HIGH (ref 11.4–15.2)

## 2018-01-04 LAB — GLUCOSE, CAPILLARY
GLUCOSE-CAPILLARY: 167 mg/dL — AB (ref 65–99)
GLUCOSE-CAPILLARY: 191 mg/dL — AB (ref 65–99)
Glucose-Capillary: 179 mg/dL — ABNORMAL HIGH (ref 65–99)
Glucose-Capillary: 182 mg/dL — ABNORMAL HIGH (ref 65–99)
Glucose-Capillary: 210 mg/dL — ABNORMAL HIGH (ref 65–99)
Glucose-Capillary: 213 mg/dL — ABNORMAL HIGH (ref 65–99)

## 2018-01-04 LAB — PROCALCITONIN: Procalcitonin: 5.03 ng/mL

## 2018-01-04 LAB — OCCULT BLOOD X 1 CARD TO LAB, STOOL: Fecal Occult Bld: POSITIVE — AB

## 2018-01-04 LAB — HEPARIN LEVEL (UNFRACTIONATED): Heparin Unfractionated: 0.32 IU/mL (ref 0.30–0.70)

## 2018-01-04 MED ORDER — VANCOMYCIN HCL 500 MG IV SOLR
500.0000 mg | INTRAVENOUS | Status: AC
  Administered 2018-01-04: 500 mg via INTRAVENOUS
  Filled 2018-01-04: qty 500

## 2018-01-04 NOTE — Progress Notes (Signed)
Inpatient Diabetes Program Recommendations  AACE/ADA: New Consensus Statement on Inpatient Glycemic Control (2015)  Target Ranges:  Prepandial:   less than 140 mg/dL      Peak postprandial:   less than 180 mg/dL (1-2 hours)      Critically ill patients:  140 - 180 mg/dL   Lab Results  Component Value Date   GLUCAP 182 (H) 01/04/2018   HGBA1C 6.8 10/13/2017    Review of Glycemic Control Results for Oscar Castillo, Oscar Castillo (MRN [id number]) as of 01/04/2018 11:37  Ref. Range 01/03/2018 16:19 01/03/2018 20:32 01/04/2018 00:20 01/04/2018 04:22 01/04/2018 07:53  Glucose-Capillary Latest Ref Range: 65 - 99 mg/dL 194 (H) 196 (H) 210 (H) 213 (H) 182 (H)    Diabetes history: Type 2 DM  Outpatient Diabetes medications:  Humalog 2-10 units q 6 hours. Current orders for Inpatient glycemic control:  Nepro 40 cc/hr Novolog sensitive q 4 hours Inpatient Diabetes Program Recommendations:   Blood sugars slightly > goal. If appropriate, consider adding Lantus 5 units daily.    Thanks,  Adah Perl, RN, BC-ADM Inpatient Diabetes Coordinator Pager [phone] (8a-5p)

## 2018-01-04 NOTE — Telephone Encounter (Signed)
sch appt vm NA 01/18/18 3pm suture removal

## 2018-01-04 NOTE — Care Management Important Message (Signed)
Important Message  Patient Details  Name: Oscar Castillo MRN: [id number] Date of Birth: 24-Nov-1950   Medicare Important Message Given:  Yes    [name] 01/04/2018, 1:21 PM

## 2018-01-04 NOTE — Plan of Care (Signed)
PMT  In to see patient and family to offer support. No family at bedside. Patient says "mmm huh" to all questions asked. No obvious distress noted.  No charge note.

## 2018-01-04 NOTE — Progress Notes (Addendum)
[facility] Progress Note   Dialysis Orders: [place] [facility]  4h 88kg 2/2 bath p4 L TDC heparin 4000 then 2000 midrun -venofer 100 mg x 10 to start 6/8   Assessment/Plan: 1Fevers/ sepsis - scant basilar infiltrates at bases on CT. BCx 1/4+ for MRSE. On IV abx. Recurrent unexplained fevers in pt with chronic tunneled HD catheter -- catheter removed 6/10- still having fevers though not quite as high as yesterday 2 Confusion -possibly a little better not sure baseline after recent similar admit- does not seem to recognize me;  3 Esrd on HD TTS. HD 6/10 - next tmt planned Thursday - on catheter holiday K 3.3 yesterday- should have dialyzed on an added K bath - supp K 40 meQ given late last evening - given this and very low P and that he will not be having HD until Thursday - repeat renal in am to decline on further repletion.- have stopped binders 4 Hypotension/volume - bp's are usually normal at OP HD. Midodrine added here. BP's are labile. net UF 725 6/10 5 Anemia ckd - Hgb trending down 8.7- significant decline since admission 60 Aranesp given 6/10. Holding IV Fe here.  repeat FOBT 6 mbd ckd -  P 1- stopped binders 7 Afib - on anticoag 8 DM2 9 FTT - failure to thrive, Appreciate palliative care consult - at this point - his wife wishes to not focus on comfort care, but to do everything as this as what patient had expressed prior to being unable to express his wishes.; have discussed with pt's wife - who believes once the source of infection has been identified, she expects him to improve 10. Nutrition - nepro at 40 per PEG/prostat   Myriam Jacobson, PA-C [place] [phone] 01/04/2018,10:38 AM  LOS: 5 days   Pt seen, examined and agree w A/P as above.  Kelly Splinter MD [facility] pager [phone]   01/04/2018, 3:51 PM    Subjective:   Not answering questions but awake. Wife at bedside  Objective Vitals:    01/03/18 1600 01/04/18 0500 01/04/18 0750 01/04/18 1022  BP: (!) 103/52  135/63   Pulse:      Resp: (!) 23  (!) 37   Temp: 99 F (37.2 C)  (!) 100.6 F (38.1 C) 99.5 F (37.5 C)  TempSrc: Oral  Oral   SpO2:      Weight:  91.2 kg (201 lb 1 oz)    Height:       Physical Exam General: NAD, slow to open eyes, seems to have some generalized puffiness Heart: RRR Lungs: dim BS no overt sob Abdomen: obese PEG infusing Extremities: bilateral  BKA  Neuro: not answering questions today Dialysis Access: none - catheter removed 6/10    Additional Objective Labs: Basic Metabolic Panel: Recent Labs  Lab 01/01/18 0328 01/02/18 0404 01/03/18 0720  NA 133* 139 136  K 4.6 3.9 3.3*  CL 94* 96* 97*  CO2 24 30 28   GLUCOSE 179* 179* 191*  BUN 88* 36* 66*  CREATININE 10.07* 5.28* 6.92*  CALCIUM 8.9 9.1 8.7*  PHOS 2.7 1.2* 1.0*   Liver Function Tests: Recent Labs  Lab 12/30/17 1242 01/03/18 0720  AST 19  --   ALT 14*  --   ALKPHOS 54  --   BILITOT 0.6  --   PROT 8.4*  --   ALBUMIN 3.0* 2.1*   No results for input(s): LIPASE, AMYLASE in the last 168 hours. CBC: Recent  Labs  Lab 12/30/17 1242  12/31/17 0213 01/01/18 0328 01/02/18 0404 01/03/18 0513 01/04/18 0717  WBC 10.4  --  12.1* 11.7* 9.6 9.3 9.0  NEUTROABS 8.5*  --   --   --   --   --   --   HGB 11.4*   < > 9.3* 9.2* 9.3* 8.8* 8.7*  HCT 35.9*   < > 29.1* 28.9* 29.0* 27.7* 27.7*  MCV 88.6  --  89.0 89.2 86.3 87.9 87.9  PLT 317  --  235 280 318 343 374   < > = values in this interval not displayed.   Blood Culture    Component Value Date/Time   SDES BLOOD RIGHT ANTECUBITAL 01/02/2018 0853   SPECREQUEST  01/02/2018 0853    BOTTLES DRAWN AEROBIC AND ANAEROBIC Blood Culture adequate volume   CULT  01/02/2018 0853    NO GROWTH 1 DAY Performed at [facility], [place] [address], [place], [place] [address]    REPTSTATUS PENDING 01/02/2018 4270    Cardiac Enzymes: Recent Labs  Lab 12/30/17 1449  12/30/17 1815 12/31/17 0213  TROPONINI 0.05* 0.05* 0.06*   CBG: Recent Labs  Lab 01/03/18 1619 01/03/18 2032 01/04/18 0020 01/04/18 0422 01/04/18 0753  GLUCAP 194* 196* 210* 213* 182*   Iron Studies: No results for input(s): IRON, TIBC, TRANSFERRIN, FERRITIN in the last 72 hours. Lab Results  Component Value Date   INR 2.45 01/04/2018   INR 1.80 01/03/2018   INR 1.62 01/02/2018   Studies/Results: Dg Chest Port 1 View  Result Date: 01/02/2018 CLINICAL DATA:  Shortness of breath and chest pain for 1 day. EXAM: PORTABLE CHEST 1 VIEW COMPARISON:  12/30/2017 FINDINGS: Heart size is normal. Pacemaker and left jugular dual-lumen central venous catheter remain in appropriate position. Low lung volumes again seen. Mild scarring is again noted at the right lung base. No evidence of pulmonary infiltrate or pleural effusion. IMPRESSION: No active disease. Electronically Signed   By: Earle Gell M.D.   On: 01/02/2018 11:41   Medications: . sodium chloride Stopped (01/02/18 1800)  . heparin 1,400 Units/hr (01/04/18 6237)  . vancomycin    . [START ON 01/06/2018] vancomycin     . amiodarone  200 mg Per Tube Daily  . atorvastatin  40 mg Per Tube QHS  . Chlorhexidine Gluconate Cloth  6 each Topical Q0600  . [START ON 01/11/2018] darbepoetin (ARANESP) injection - DIALYSIS  60 mcg Intravenous Q Tue-HD  . escitalopram  10 mg Per Tube Daily  . famotidine  20 mg Oral QHS  . feeding supplement (NEPRO CARB STEADY)  1,000 mL Per Tube Q24H  . feeding supplement (PRO-STAT SUGAR FREE 64)  30 mL Per Tube TID  . insulin aspart  0-9 Units Subcutaneous Q4H  . latanoprost  1 drop Both Eyes QHS  . mouth rinse  15 mL Mouth Rinse BID  . midodrine  5 mg Per Tube BID WC  . sevelamer carbonate  1,600 mg Per Tube BID WC

## 2018-01-04 NOTE — Progress Notes (Signed)
  Progress Note  Will plan tunneled dialysis catheter tomorrow in OR. NPO past midnight.  Olubunmi Rothenberger C. Donzetta Matters, MD [facility] Office: [phone] Pager: [phone]

## 2018-01-04 NOTE — Progress Notes (Signed)
ANTICOAGULATION CONSULT NOTE - Follow Up Consult  Pharmacy Consult for Heparin Indication: atrial fibrillation  Allergies  Allergen Reactions  . Ceftriaxone Sodium In Dextrose Anaphylaxis    Tolerates Zosyn  . Ciprofloxacin Other (See Comments)    UNSPECIFIED PAIN  . Nsaids Other (See Comments)    HISTORY OF ULCER  . Penicillins Swelling    Tolerates Zosyn [ PATIENT WITH HX OF ANAPHYLAXIS TO CEFTRIAXONE ] 11/26/17:  pt has received 3 doses of zosyn in past (04/2017.) No allergy tor adverse event to zosyn noted at that time.     . Sulfa Antibiotics Swelling    SWELLING REACTION UNSPECIFIED     Patient Measurements: Height: 5\' 10"  (177.8 cm) Weight: 201 lb 1 oz (91.2 kg) IBW/kg (Calculated) : 73 Heparin Dosing Weight:    Vital Signs: Temp: 100.6 F (38.1 C) (06/11 0750) Temp Source: Oral (06/11 0750) BP: 135/63 (06/11 0750)  Labs: Recent Labs    01/02/18 0404  01/03/18 0513 01/03/18 0720 01/03/18 1352 01/04/18 0717  HGB 9.3*  --  8.8*  --   --  8.7*  HCT 29.0*  --  27.7*  --   --  27.7*  PLT 318  --  343  --   --  374  LABPROT 19.1*  --  20.8*  --   --  26.4*  INR 1.62  --  1.80  --   --  2.45  HEPARINUNFRC  --    < > 0.55  --  0.67 0.32  CREATININE 5.28*  --   --  6.92*  --   --    < > = values in this interval not displayed.    Estimated Creatinine Clearance: 11.9 mL/min (A) (by C-G formula based on SCr of 6.92 mg/dL (H)).  Assessment:  Anticoag: warf PTA for PAF with hx CVA - INR 3.17 on admit, rose to 7.03, then <2 after Vit K 6/8.  Hep bridge initiated.  H/H down significantly, Plt wnl  Per MAR in shadow chart, just increased Coumadin from 5 to 5.5 mg daily on 6/3 - HL 0.32, INR 2.45 trending back up, Hgb and plts stable from yesterday  Goal of Therapy:  Heparin level 0.3-0.7 units/ml Monitor platelets by anticoagulation protocol: Yes   Plan:  -Continue Heparin at 1400 units/hr  -Daily heparin level and CBC -Hold Coumadin until after line  placement - f/u to replace phos and possibly more Vitamin K?   Oscar Castillo S. Alford [place], PharmD, BCPS Clinical Staff Pharmacist Pager [phone]  Oscar Castillo 01/04/2018,8:37 AM

## 2018-01-04 NOTE — Telephone Encounter (Signed)
-----   Message from Penni Homans, RN sent at 01/04/2018  3:10 PM EDT ----- Regarding: Appointment   ----- Message ----- From: Iline Oven Sent: 01/04/2018   2:14 PM To: [facility] Charge Pool  Patient will need nurse visit for suture removal in 2 weeks.  Patient is still in hospital.  L TDC removed at bedside yesterday. Thanks, [facility]

## 2018-01-04 NOTE — Progress Notes (Signed)
Nutrition Follow-up  DOCUMENTATION CODES:   Obesity unspecified  INTERVENTION:  Continue via PEG:  Nepro at 38m/hr (9663mper day) Pro-stat 3054mID Provides 2028 calories, 123gm protein, 698m29mee water  NUTRITION DIAGNOSIS:   Inadequate oral intake related to inability to eat as evidenced by NPO status. -ongoing  GOAL:   Patient will meet greater than or equal to 90% of their needs -met with TF  MONITOR:   TF tolerance, Labs, I & O's, Skin  ASSESSMENT:   67 y43male with PMH of ESRD-HD, CVA, CHF, DM, pacemaker, hepatitis C, HTN, PEG, and bilateral BKAs who was admitted on 6/6 with shock, sepsis.  Patient is well known to RD, spoke with wife at bedside. Patient had PEG placed due to lethargy, inability to swallow. Left IJ dialysis cath removed 01/03/2018, plan for temp cath to be placed by vascular 01/05/2018. IV ABx for 10 days following, then placement of new tunneled cath. He continues to be confused, unable to provide history. Tolerating tube feeds. EDW 88kg  Labs reviewed:  K+ 3.3 CBGs 167, 182, 213, 210  Medications reviewed and include:  Insulin Heparin gtt  Diet Order:   Diet Order           Diet NPO time specified Except for: Sips with Meds  Diet effective midnight          EDUCATION NEEDS:   No education needs have been identified at this time  Skin:  Skin Assessment: Skin Integrity Issues: Skin Integrity Issues:: Stage I, Stage II, Unstageable Stage I: sacrum Stage II: rectum, buttocks Unstageable: L leg  Last BM:  01/04/2018  Height:   Ht Readings from Last 1 Encounters:  12/30/17 _0  (1.778 m)    Weight:   Wt Readings from Last 1 Encounters:  01/04/18 201 lb 1 oz (91.2 kg)    Ideal Body Weight:  65.6 kg(bilateral BKA)  BMI:  Body mass index is 28.85 kg/m.  Estimated Nutritional Needs:   Kcal:  1900-2100  Protein:  115-125 gm  Fluid:  1.2 L  WillSatira Anisrd, MS, RD LDN Inpatient Clinical Dietitian Pager  [phone]

## 2018-01-04 NOTE — Progress Notes (Signed)
@IPLOG @        PROGRESS NOTE                                                                                                                                                                                                             Patient Demographics:    Oscar Castillo, is a 67 y.o. male, DOB - November 10, 1950, WUJ:[id number]  Admit date - 12/30/2017   Admitting Physician Aldean Jewett, MD  Outpatient Primary MD for the patient is [name], MD  LOS - 5  Chief Complaint  Patient presents with  . Altered Mental Status       Brief Narrative this is a 67 year old African-American gentleman who is bedbound, bilateral BKA, ESRD on dialysis, chronic systolic CHF, DM type II, hepatitis C, hypertension, and PEG tube placement due to ongoing encephalopathy for the last few months, multiple recent admissions in the last 3 months, who was discharged from this hospital a month ago, stayed in [place] for a few weeks thereafter was discharged to SNF from where he was brought again to [facility] on 12/30/2017 for sepsis and encephalopathy, his encephalopathy seems to be acute on chronic after discussing with wife it appears like he has been confused more or less for the last 2 months.  He was seen and treated under pulmonary critical care from 12/30/2017 to 01/02/2018, has been transferred under my service on 01/02/2018.  Still encephalopathic.  Meantime blood cultures 1 out of 2 positive for coag negative staph, he did have extremely elevated procalcitonin with the left IJ tunneled dialysis catheter, case was discussed with ID and left IJ dialysis catheter was removed on 01/03/2018 plan is to place a temporary catheter on 01/05/2018 by vascular surgery for dialysis, give him a total of 10 days of IV antibiotics thereafter place a new tunneled catheter.  Overall extremely poor quality of life and still signs of sepsis, wife adamant to continue aggressive measures.   Subjective:   Patient laying in  bed in no distress but somnolent, currently confused, unable to answer any questions reliably or follow commands, wife sitting bedside.   Assessment  & Plan :     1.  Severe sepsis with toxic encephalopathy.  No clear source, there was question of pneumonia upon admission, 1 out of 2 blood cultures coag negative staph positive, currently on vancomycin and Zosyn and sepsis physiology seems to have somewhat defervesced, still is encephalopathic, continue supportive care, continue present antibiotics, repeat two-view chest x-ray.  Repeat surveillance blood cultures  ordered on 01/02/2018 are negative so far, still running low-grade fevers from time to time, procalcitonin was extremely elevated.  I discussed his case with ID physician on call Dr. Dwyane Dee on 01/03/2018 after which is left IJ dialysis catheter was removed by Dr. Gwenlyn Saran vascular surgeon, plan is to continue IV vancomycin replace temporary dialysis catheter on 01/05/2018 for dialysis.  Give him 10 more days of vancomycin thereafter if infection has defervesced placed another tunneled catheter.  Vascular surgery is on board.  In the meantime due to low-grade fevers another dose of vancomycin was given on 01/04/2018.  We will continue to monitor.  All prognosis appears extremely poor, quality of life baseline extremely poor, had detailed discussions with wife bedside on 01/04/2018 again, unfortunately she still thinks that patient has good chances of recovery and wants to continue aggressive measures.   2.  Toxic encephalopathy.  This seems to be ongoing for the last 2 months at least, had long discussions with patient's wife on 01/02/2018, she still wants to pursue aggressive measures and is somewhat unrealistic in her expectations.  Will continue present line of care and monitor.  3.  PEG tube feeds.  Virtually n.p.o. due to #2 above, continue PEG tube feeds.  4.  Stable enteritis on CT scan.  C. difficile negative, GI pathogen panel negative.  Likely  diarrhea due to osmolar diuresis from tube feeds.  Continue to monitor.  5.  ESRD.  Renal on board getting dialyzed.  6.  Chronic combined systolic and diastolic heart failure EF now 30% down from 50% in 2015.  Likely EF down due to sepsis, patient has improved and will try to titrate down midodrine and add low-dose beta-blocker, no ACE/ARB due to ESRD.  Will continue to monitor.  Likely will need a repeat echo in couple of months.  7.  Paroxysmal atrial fibrillation.  Mali vas 2 score of 5.  On amiodarone, was on Coumadin at home, no anticoagulation in ICU, start on heparin and Coumadin bridge today.  Pacemaker.  8.  Bilateral BKA.  Supportive care.  9.  Dyslipidemia.  On statin continue.  10. Stage 2 Sacral Decub Ulcer - POA - Nursing care, see RN notes for details.   11. DM type II.  Continue present insulin regimen.  CBG (last 3)  Recent Labs    01/04/18 0422 01/04/18 0753 01/04/18 1156  GLUCAP 213* 182* 167*      Patient appears to have poor quality of life by virtue of being bedbound, bilateral BKA, feeding through PEG tube, ongoing encephalopathy for several weeks if not months with recurrent hospitalization, ESRD, poor heart function.  All conveyed to the wife however at this time she wants to continue aggressive measures.  Unfortunately she appears to have somewhat unrealistic expectations.    Diet : Tube Feeds  Family Communication  :  Wife in detail on 01/02/18  Code Status :  Full  Disposition Plan  :  SNF once Encephalopathy and Sepsis are better  Consults  :  PCCM, renal, ID over phone by vascular surgery  Procedures  :    Left IJ catheter removal by vascular surgeon Dr. Donzetta Matters on 01/03/2018.    CT Abd Pelvis - ? Enteritis  TTE - - Left ventricle: Poor acoustic windows limit study LVEF is  depressed at approximately 30 to 35% with severe hypokinesis/ akiensis of the septal wall and inferior wall The cavity size was  normal. Wall thickness was increased in a  pattern of moderate  LVH. The estimated ejection fraction was in the range of 25% to   30%. - Aortic valve: AV is thickened, calcified with restricted motion  Peak gradient through the valve is 15 mm. 2 d imaging suggests that AS is moderate   Dimensionless index is 0.59, consistent with mild to moderate AS> - Mitral valve: Calcified annulus. Mildly thickened leaflets .   There was mild regurgitation.   DVT Prophylaxis  :   Heparin   Lab Results  Component Value Date   PLT 374 01/04/2018    Inpatient Medications  Scheduled Meds: . amiodarone  200 mg Per Tube Daily  . atorvastatin  40 mg Per Tube QHS  . Chlorhexidine Gluconate Cloth  6 each Topical Q0600  . [START ON 01/11/2018] darbepoetin (ARANESP) injection - DIALYSIS  60 mcg Intravenous Q Tue-HD  . escitalopram  10 mg Per Tube Daily  . famotidine  20 mg Oral QHS  . feeding supplement (NEPRO CARB STEADY)  1,000 mL Per Tube Q24H  . feeding supplement (PRO-STAT SUGAR FREE 64)  30 mL Per Tube TID  . insulin aspart  0-9 Units Subcutaneous Q4H  . latanoprost  1 drop Both Eyes QHS  . mouth rinse  15 mL Mouth Rinse BID  . midodrine  5 mg Per Tube BID WC   Continuous Infusions: . sodium chloride Stopped (01/02/18 1800)  . heparin 1,400 Units/hr (01/04/18 0623)  . vancomycin 500 mg (01/04/18 1112)  . [START ON 01/06/2018] vancomycin     PRN Meds:.sodium chloride, acetaminophen, lidocaine  Antibiotics  :    Anti-infectives (From admission, onward)   Start     Dose/Rate Route Frequency Ordered Stop   01/06/18 1200  vancomycin (VANCOCIN) IVPB 1000 mg/200 mL premix     1,000 mg 200 mL/hr over 60 Minutes Intravenous Every T-Th-Sa (Hemodialysis) 01/03/18 1029     01/04/18 1200  vancomycin (VANCOCIN) IVPB 1000 mg/200 mL premix  Status:  Discontinued     1,000 mg 200 mL/hr over 60 Minutes Intravenous Every T-Th-Sa (Hemodialysis) 01/02/18 1251 01/03/18 1029   01/04/18 0915  vancomycin (VANCOCIN) 500 mg in sodium chloride 0.9 % 100 mL  IVPB     500 mg 100 mL/hr over 60 Minutes Intravenous NOW 01/04/18 0904 01/05/18 0915   01/03/18 1200  vancomycin (VANCOCIN) IVPB 1000 mg/200 mL premix     1,000 mg 200 mL/hr over 60 Minutes Intravenous Every Mon (Hemodialysis) 01/03/18 1026 01/03/18 1131   01/01/18 1200  vancomycin (VANCOCIN) IVPB 1000 mg/200 mL premix     1,000 mg 200 mL/hr over 60 Minutes Intravenous Every T-Th-Sa (Hemodialysis) 01/01/18 0922 01/02/18 0000   12/30/17 2200  piperacillin-tazobactam (ZOSYN) IVPB 3.375 g  Status:  Discontinued     3.375 g 12.5 mL/hr over 240 Minutes Intravenous Every 12 hours 12/30/17 1234 01/03/18 1611   12/30/17 1315  vancomycin (VANCOCIN) 2,000 mg in sodium chloride 0.9 % 500 mL IVPB     2,000 mg 250 mL/hr over 120 Minutes Intravenous  Once 12/30/17 1231 12/30/17 1517   12/30/17 1230  vancomycin (VANCOCIN) IVPB 1000 mg/200 mL premix  Status:  Discontinued     1,000 mg 200 mL/hr over 60 Minutes Intravenous  Once 12/30/17 1225 12/30/17 1229   12/30/17 1230  piperacillin-tazobactam (ZOSYN) IVPB 3.375 g     3.375 g 100 mL/hr over 30 Minutes Intravenous  Once 12/30/17 1225 12/30/17 1314         Objective:   Vitals:   01/03/18 1600 01/04/18 0500 01/04/18 0750 01/04/18  1022  BP: (!) 103/52  135/63   Pulse:      Resp: (!) 23  (!) 37   Temp: 99 F (37.2 C)  (!) 100.6 F (38.1 C) 99.5 F (37.5 C)  TempSrc: Oral  Oral   SpO2:      Weight:  91.2 kg (201 lb 1 oz)    Height:        Wt Readings from Last 3 Encounters:  01/04/18 91.2 kg (201 lb 1 oz)  12/02/17 90.1 kg (198 lb 10.2 oz)  10/25/17 95.3 kg (210 lb)     Intake/Output Summary (Last 24 hours) at 01/04/2018 1203 Last data filed at 01/04/2018 0600 Gross per 24 hour  Intake 1476.48 ml  Output 300 ml  Net 1176.48 ml     Physical Exam  Patient somnolent in dialysis bed, answers a few basic questions but unable to follow commands reliably, in no distress [email or web address].AT,PERRAL Supple Neck,No JVD, No cervical lymphadenopathy  appriciated.  Symmetrical Chest wall movement, Good air movement bilaterally, CTAB RRR,No Gallops, Rubs or new Murmurs, No Parasternal Heave +ve B.Sounds, Abd Soft, No tenderness, No organomegaly appriciated, No rebound - guarding or rigidity. PEG in place No Cyanosis, Clubbing or edema, No new Rash or bruise bilateral BKA      Data Review:    CBC Recent Labs  Lab 12/30/17 1242  12/31/17 0213 01/01/18 0328 01/02/18 0404 01/03/18 0513 01/04/18 0717  WBC 10.4  --  12.1* 11.7* 9.6 9.3 9.0  HGB 11.4*   < > 9.3* 9.2* 9.3* 8.8* 8.7*  HCT 35.9*   < > 29.1* 28.9* 29.0* 27.7* 27.7*  PLT 317  --  235 280 318 343 374  MCV 88.6  --  89.0 89.2 86.3 87.9 87.9  MCH 28.1  --  28.4 28.4 27.7 27.9 27.6  MCHC 31.8  --  32.0 31.8 32.1 31.8 31.4  RDW 15.9*  --  16.0* 15.9* 15.8* 16.1* 16.2*  LYMPHSABS 0.8  --   --   --   --   --   --   MONOABS 1.0  --   --   --   --   --   --   EOSABS 0.0  --   --   --   --   --   --   BASOSABS 0.0  --   --   --   --   --   --    < > = values in this interval not displayed.    Chemistries  Recent Labs  Lab 12/30/17 1242 12/30/17 1243 12/31/17 0213 01/01/18 0328 01/02/18 0404 01/03/18 0720  NA 133* 131* 133* 133* 139 136  K 4.7 4.6 4.4 4.6 3.9 3.3*  CL 90* 94* 95* 94* 96* 97*  CO2 25  --  25 24 30 28   GLUCOSE 238* 240* 105* 179* 179* 191*  BUN 59* 54* 68* 88* 36* 66*  CREATININE 8.88* 8.20* 8.79* 10.07* 5.28* 6.92*  CALCIUM 9.3  --  8.7* 8.9 9.1 8.7*  MG  --   --  2.2 2.4 2.3  --   AST 19  --   --   --   --   --   ALT 14*  --   --   --   --   --   ALKPHOS 54  --   --   --   --   --   BILITOT 0.6  --   --   --   --   --    ------------------------------------------------------------------------------------------------------------------  No results for input(s): CHOL, HDL, LDLCALC, TRIG, CHOLHDL, LDLDIRECT in the last 72 hours.  Lab Results  Component Value Date   HGBA1C 6.8 10/13/2017    ------------------------------------------------------------------------------------------------------------------ No results for input(s): TSH, T4TOTAL, T3FREE, THYROIDAB in the last 72 hours.  Invalid input(s): FREET3 ------------------------------------------------------------------------------------------------------------------ No results for input(s): VITAMINB12, FOLATE, FERRITIN, TIBC, IRON, RETICCTPCT in the last 72 hours.  Coagulation profile Recent Labs  Lab 12/31/17 2050 01/01/18 0328 01/02/18 0404 01/03/18 0513 01/04/18 0717  INR 5.91* 7.03* 1.62 1.80 2.45    No results for input(s): DDIMER in the last 72 hours.  Cardiac Enzymes Recent Labs  Lab 12/30/17 1449 12/30/17 1815 12/31/17 0213  TROPONINI 0.05* 0.05* 0.06*   ------------------------------------------------------------------------------------------------------------------ No results found for: BNP  Micro Results Recent Results (from the past 240 hour(s))  Blood Culture (routine x 2)     Status: Abnormal   Collection Time: 12/30/17 12:54 PM  Result Value Ref Range Status   Specimen Description BLOOD SITE NOT SPECIFIED  Final   Special Requests   Final    BOTTLES DRAWN AEROBIC AND ANAEROBIC Blood Culture adequate volume   Culture  Setup Time   Final    GRAM POSITIVE COCCI IN CLUSTERS AEROBIC BOTTLE ONLY CRITICAL RESULT CALLED TO, READ BACK BY AND VERIFIED WITH: PHARMD E MARTIN [id number] MLM    Culture (A)  Final    STAPHYLOCOCCUS SPECIES (COAGULASE NEGATIVE) THE SIGNIFICANCE OF ISOLATING THIS ORGANISM FROM A SINGLE SET OF BLOOD CULTURES WHEN MULTIPLE SETS ARE DRAWN IS UNCERTAIN. PLEASE NOTIFY THE MICROBIOLOGY DEPARTMENT WITHIN ONE WEEK IF SPECIATION AND SENSITIVITIES ARE REQUIRED. Performed at [facility], [place] [address], [place], [place] [address]    Report Status 01/02/2018 FINAL  Final  Blood Culture ID Panel (Reflexed)     Status: Abnormal   Collection Time: 12/30/17 12:54 PM   Result Value Ref Range Status   Enterococcus species NOT DETECTED NOT DETECTED Final   Listeria monocytogenes NOT DETECTED NOT DETECTED Final   Staphylococcus species DETECTED (A) NOT DETECTED Final    Comment: Methicillin (oxacillin) resistant coagulase negative staphylococcus. Possible blood culture contaminant (unless isolated from more than one blood culture draw or clinical case suggests pathogenicity). No antibiotic treatment is indicated for blood  culture contaminants. CRITICAL RESULT CALLED TO, READ BACK BY AND VERIFIED WITH: PHARMD E MARTIN [id number] MLM    Staphylococcus aureus NOT DETECTED NOT DETECTED Final   Methicillin resistance DETECTED (A) NOT DETECTED Final    Comment: CRITICAL RESULT CALLED TO, READ BACK BY AND VERIFIED WITH: PHARMD E MARTIN [id number] MLM    Streptococcus species NOT DETECTED NOT DETECTED Final   Streptococcus agalactiae NOT DETECTED NOT DETECTED Final   Streptococcus pneumoniae NOT DETECTED NOT DETECTED Final   Streptococcus pyogenes NOT DETECTED NOT DETECTED Final   Acinetobacter baumannii NOT DETECTED NOT DETECTED Final   Enterobacteriaceae species NOT DETECTED NOT DETECTED Final   Enterobacter cloacae complex NOT DETECTED NOT DETECTED Final   Escherichia coli NOT DETECTED NOT DETECTED Final   Klebsiella oxytoca NOT DETECTED NOT DETECTED Final   Klebsiella pneumoniae NOT DETECTED NOT DETECTED Final   Proteus species NOT DETECTED NOT DETECTED Final   Serratia marcescens NOT DETECTED NOT DETECTED Final   Haemophilus influenzae NOT DETECTED NOT DETECTED Final   Neisseria meningitidis NOT DETECTED NOT DETECTED Final   Pseudomonas aeruginosa NOT DETECTED NOT DETECTED Final   Candida albicans NOT DETECTED NOT DETECTED Final   Candida glabrata NOT DETECTED NOT DETECTED Final  Candida krusei NOT DETECTED NOT DETECTED Final   Candida parapsilosis NOT DETECTED NOT DETECTED Final   Candida tropicalis NOT DETECTED NOT DETECTED Final    Comment:  Performed at [facility], [place] [address], [place], [place] [address]  Blood Culture (routine x 2)     Status: None   Collection Time: 12/30/17 12:55 PM  Result Value Ref Range Status   Specimen Description BLOOD RIGHT ANTECUBITAL  Final   Special Requests   Final    BOTTLES DRAWN AEROBIC AND ANAEROBIC Blood Culture adequate volume   Culture   Final    NO GROWTH 5 DAYS Performed at [facility], [place] [address] [place], [place] [address]    Report Status 01/04/2018 FINAL  Final  MRSA PCR Screening     Status: None   Collection Time: 12/30/17  3:16 PM  Result Value Ref Range Status   MRSA by PCR NEGATIVE NEGATIVE Final    Comment:        The GeneXpert MRSA Assay (FDA approved for NASAL specimens only), is one component of a comprehensive MRSA colonization surveillance program. It is not intended to diagnose MRSA infection nor to guide or monitor treatment for MRSA infections. Performed at [facility], [place] [address], [place], [place] [address]   C difficile quick scan w PCR reflex     Status: None   Collection Time: 01/01/18  8:09 AM  Result Value Ref Range Status   C Diff antigen NEGATIVE NEGATIVE Final   C Diff toxin NEGATIVE NEGATIVE Final   C Diff interpretation No C. difficile detected.  Final  Culture, blood (routine x 2)     Status: None (Preliminary result)   Collection Time: 01/02/18  8:43 AM  Result Value Ref Range Status   Specimen Description BLOOD RIGHT ANTECUBITAL  Final   Special Requests   Final    BOTTLES DRAWN AEROBIC AND ANAEROBIC Blood Culture adequate volume   Culture   Final    NO GROWTH 2 DAYS Performed at [facility], [address] [place], [place] [address]    Report Status PENDING  Incomplete  Culture, blood (routine x 2)     Status: None (Preliminary result)   Collection Time: 01/02/18  8:53 AM  Result Value Ref Range Status   Specimen Description BLOOD RIGHT ANTECUBITAL  Final   Special Requests   Final     BOTTLES DRAWN AEROBIC AND ANAEROBIC Blood Culture adequate volume   Culture   Final    NO GROWTH 2 DAYS Performed at [facility], [place] [address], [place], [place] [address]    Report Status PENDING  Incomplete  Culture, blood (routine x 2)     Status: None (Preliminary result)   Collection Time: 01/03/18  1:52 PM  Result Value Ref Range Status   Specimen Description BLOOD RIGHT HAND  Final   Special Requests   Final    BOTTLES DRAWN AEROBIC ONLY Blood Culture adequate volume   Culture   Final    NO GROWTH < 24 HOURS Performed at [facility], [place] [address], [place], [place] [address]    Report Status PENDING  Incomplete  Culture, blood (routine x 2)     Status: None (Preliminary result)   Collection Time: 01/03/18  1:52 PM  Result Value Ref Range Status   Specimen Description BLOOD RIGHT HAND  Final   Special Requests   Final    BOTTLES DRAWN  AEROBIC ONLY Blood Culture results may not be optimal due to an inadequate volume of blood received in culture bottles   Culture   Final    NO GROWTH < 24 HOURS Performed at [facility] [address] [place], [place] [address]    Report Status PENDING  Incomplete    Radiology Reports Ct Abdomen Pelvis Wo Contrast  Result Date: 12/30/2017 CLINICAL DATA:  Fever and chills, hypotensive. Assess for abscess. History of end-stage renal disease on dialysis, EXAM: CT ABDOMEN AND PELVIS WITHOUT CONTRAST TECHNIQUE: Multidetector CT imaging of the abdomen and pelvis was performed following the standard protocol without IV contrast. COMPARISON:  CT abdomen and pelvis Dec 23, 2017 FINDINGS: Mild respiratory motion degraded examination. LOWER CHEST: Tree-in-bud infiltrates right lower lobe with patchy consolidation. Included heart size is normal. Pacemaker wires in place. HEPATOBILIARY: Punctate hepatic calcification. Otherwise unremarkable. Normal gallbladder. PANCREAS: Normal. SPLEEN: Normal. ADRENALS/URINARY TRACT: Kidneys are  orthotopic, mildly atrophic. 12 mm probable cyst right interpolar kidney, unchanged. No nephrolithiasis, hydronephrosis; limited assessment for renal masses by nonenhanced CT. The unopacified ureters are normal in course and caliber. Urinary bladder is partially distended and unremarkable. Normal adrenal glands. STOMACH/BOWEL: Intraluminal gastrostomy tube. The stomach, small and large bowel are normal in course and caliber without inflammatory changes, mild amount of retained large bowel stool. Large bowel air-fluid levels. Mild colonic diverticulosis. Normal appendix. VASCULAR/LYMPHATIC: Aortoiliac vessels are normal in course and caliber. Moderate to severe calcific atherosclerosis. No lymphadenopathy by CT size criteria. REPRODUCTIVE: Mild prostatomegaly. OTHER: No intraperitoneal free fluid or free air. MUSCULOSKELETAL: Non-acute. Focal sclerosis bilateral femoral heads compatible with avascular necrosis without collapse. Advanced spondylosis with multilevel Schmorl's nodes and erosions, some which may be related to renal osteodystrophy/hyperparathyroidism. IMPRESSION: 1. Bibasilar bronchiolitis/pneumonia. 2. No abscess. 3. Mild amount of retained large bowel stool. Colonic air-fluid levels seen with enteritis. Aortic Atherosclerosis (ICD10-I70.0). Electronically Signed   By: Elon Alas M.D.   On: 12/30/2017 22:05   Ct Abdomen Pelvis Wo Contrast  Result Date: 12/23/2017 CLINICAL DATA:  Unwitnessed fall.  Question she tube pulled out. EXAM: CT ABDOMEN AND PELVIS WITHOUT CONTRAST TECHNIQUE: Multidetector CT imaging of the abdomen and pelvis was performed following the standard protocol without IV contrast. COMPARISON:  Plain films 11/29/2017.  CT 05/06/2017. FINDINGS: Lower chest: Pacer wires noted in the right heart. Aortic calcifications. No aneurysm. Mild cardiomegaly. Dependent atelectasis or scarring. No effusions. Hepatobiliary: No focal hepatic abnormality. Questionable layering gallstones  within the gallbladder. Pancreas: No focal abnormality or ductal dilatation. Spleen: No focal abnormality.  Normal size. Adrenals/Urinary Tract: Mildly atrophic kidneys bilaterally. No hydronephrosis. Urinary bladder is decompressed, grossly unremarkable. Adrenal glands unremarkable. Stomach/Bowel: No gastrostomy tube is seen within the stomach or in the abdominal wall. A tract is noted from the anterior abdominal wall, but no visible gastrostomy tube. Appendix is normal. No evidence of bowel obstruction. Large stool burden in the colon, particularly rectosigmoid colon, question fecal impaction. Vascular/Lymphatic: Diffuse aortic and iliac/branch vessel calcification. No evidence of aneurysm or adenopathy. Reproductive: No visible focal abnormality. Other: No free fluid or free air. Musculoskeletal: No acute bony abnormality. IMPRESSION: No visible gastrostomy tube within the stomach or anterior abdominal wall. Suspect layering gallstones. Mildly atrophic kidneys.  No hydronephrosis. Diffuse aortoiliac atherosclerosis. Large volume stool in the rectosigmoid colon, question fecal impaction. Electronically Signed   By: Rolm Baptise M.D.   On: 12/23/2017 08:54   Dg Chest 1 View  Result Date: 12/23/2017 CLINICAL DATA:  Pain following fall EXAM: CHEST  1 VIEW COMPARISON:  November 22, 2017 FINDINGS: Central catheter tip is in the right atrium. No pneumothorax. There is no edema or consolidation. Heart is mildly enlarged. Pacemaker leads are attached to the right atrium and right ventricle. No adenopathy. No bone lesions. IMPRESSION: Central catheter tip in right atrium without pneumothorax. No edema or consolidation. Stable cardiac silhouette. Stable pacemaker lead positioning. Electronically Signed   By: Lowella Grip III M.D.   On: 12/23/2017 08:44   Ct Head Wo Contrast  Result Date: 12/23/2017 CLINICAL DATA:  Unwitnessed fall.  Altered level of consciousness EXAM: CT HEAD WITHOUT CONTRAST CT CERVICAL SPINE  WITHOUT CONTRAST TECHNIQUE: Multidetector CT imaging of the head and cervical spine was performed following the standard protocol without intravenous contrast. Multiplanar CT image reconstructions of the cervical spine were also generated. COMPARISON:  MRI 11/26/2017 FINDINGS: CT HEAD FINDINGS Brain: There is atrophy and chronic small vessel disease changes. No acute intracranial abnormality. Specifically, no hemorrhage, hydrocephalus, mass lesion, acute infarction, or significant intracranial injury. Vascular: No hyperdense vessel or unexpected calcification. Skull: No acute calvarial abnormality. Sinuses/Orbits: Visualized paranasal sinuses and mastoids clear. Orbital soft tissues unremarkable. Other: None CT CERVICAL SPINE FINDINGS Alignment: No subluxation Skull base and vertebrae: No fracture. Scattered mottled lucent areas throughout the vertebral bodies, most pronounced adjacent to the disc spaces, favor discogenic changes. Soft tissues and spinal canal: No prevertebral fluid or swelling. No visible canal hematoma. Disc levels: Diffuse degenerative disc changes with disc space narrowing and spurring. Upper chest: No acute findings Other: No acute findings.  Carotid artery calcifications. IMPRESSION: No acute intracranial abnormality. Atrophy, chronic microvascular disease. Diffuse degenerative disc disease throughout the cervical spine. No fracture or subluxation. Electronically Signed   By: Rolm Baptise M.D.   On: 12/23/2017 08:59   Ct Cervical Spine Wo Contrast  Result Date: 12/23/2017 CLINICAL DATA:  Unwitnessed fall.  Altered level of consciousness EXAM: CT HEAD WITHOUT CONTRAST CT CERVICAL SPINE WITHOUT CONTRAST TECHNIQUE: Multidetector CT imaging of the head and cervical spine was performed following the standard protocol without intravenous contrast. Multiplanar CT image reconstructions of the cervical spine were also generated. COMPARISON:  MRI 11/26/2017 FINDINGS: CT HEAD FINDINGS Brain: There is  atrophy and chronic small vessel disease changes. No acute intracranial abnormality. Specifically, no hemorrhage, hydrocephalus, mass lesion, acute infarction, or significant intracranial injury. Vascular: No hyperdense vessel or unexpected calcification. Skull: No acute calvarial abnormality. Sinuses/Orbits: Visualized paranasal sinuses and mastoids clear. Orbital soft tissues unremarkable. Other: None CT CERVICAL SPINE FINDINGS Alignment: No subluxation Skull base and vertebrae: No fracture. Scattered mottled lucent areas throughout the vertebral bodies, most pronounced adjacent to the disc spaces, favor discogenic changes. Soft tissues and spinal canal: No prevertebral fluid or swelling. No visible canal hematoma. Disc levels: Diffuse degenerative disc changes with disc space narrowing and spurring. Upper chest: No acute findings Other: No acute findings.  Carotid artery calcifications. IMPRESSION: No acute intracranial abnormality. Atrophy, chronic microvascular disease. Diffuse degenerative disc disease throughout the cervical spine. No fracture or subluxation. Electronically Signed   By: Rolm Baptise M.D.   On: 12/23/2017 08:59   Ir Replc Gastro/colonic Tube Percut W/fluoro  Result Date: 12/23/2017 INDICATION: 67 year old male with a percutaneous gastrostomy tube which was placed 2 weeks ago at an outside institution. Unfortunately, his tube was inadvertently dislodged last night when he fell out of bed. He presents from the emergency room for attempted tube rescue. EXAM: GASTROSTOMY CATHETER REPLACEMENT MEDICATIONS: None ANESTHESIA/SEDATION: None CONTRAST:  15 mL Isovue-300-administered into the gastric  lumen. FLUOROSCOPY TIME:  Fluoroscopy Time: 0 minutes 36 seconds (4 mGy). COMPLICATIONS: None immediate. PROCEDURE: Informed written consent was obtained from the patient after a thorough discussion of the procedural risks, benefits and alternatives. All questions were addressed. A timeout was performed  prior to the initiation of the procedure. An angled catheter was carefully navigated through the existing ostomy site. Using gentle hand injections of contrast material, the soft tissue tract from the skin exit site into the stomach was successfully identified and the catheter was navigated through the tract and into the stomach. A 0.035 wire was then coiled in the gastric fundus. The angled catheter was removed. An 22 French percutaneous balloon retention gastrostomy tube was lubricated in advanced over the wire into the stomach. The retention balloon was filled with 9 mL sterile saline and pulled snug against the anterior abdominal wall. The external bumper was affixed in place. A final hand injection of contrast material was performed under fluoroscopy confirming the tube location within the stomach. The tube was then flushed with saline. The patient tolerated the procedure well. IMPRESSION: Successful gastrostomy tube rescue with placement of a new 36 French balloon retention gastrostomy tube. Electronically Signed   By: Jacqulynn Cadet M.D.   On: 12/23/2017 17:43   Dg Chest Port 1 View  Result Date: 01/02/2018 CLINICAL DATA:  Shortness of breath and chest pain for 1 day. EXAM: PORTABLE CHEST 1 VIEW COMPARISON:  12/30/2017 FINDINGS: Heart size is normal. Pacemaker and left jugular dual-lumen central venous catheter remain in appropriate position. Low lung volumes again seen. Mild scarring is again noted at the right lung base. No evidence of pulmonary infiltrate or pleural effusion. IMPRESSION: No active disease. Electronically Signed   By: Earle Gell M.D.   On: 01/02/2018 11:41   Dg Chest Port 1 View  Result Date: 12/30/2017 CLINICAL DATA:  Cough.  Altered mental status. EXAM: PORTABLE CHEST 1 VIEW COMPARISON:  12/23/2017. FINDINGS: Left jugular catheter tip in the right atrium. Stable right subclavian pacemaker leads. Normal sized heart. Clear lungs with normal vascularity. Thoracic spine degenerative  changes. IMPRESSION: No acute abnormality. The left jugular catheter tip remains in the right atrium. Electronically Signed   By: Claudie Revering M.D.   On: 12/30/2017 12:50    Time Spent in minutes  30   Lala Lund M.D on 01/04/2018 at 12:03 PM  Between 7am to 7pm - Pager - [phone] ( page via [email or web address], text pages only, please mention full 10 digit call back number). After 7pm go to [email or web address] - password [facility]

## 2018-01-05 ENCOUNTER — Encounter (HOSPITAL_COMMUNITY)
Admission: EM | Disposition: A | Discharge status: Nursing Home (Includes ICF) | Payer: Self-pay | Source: Skilled Nursing Facility | Attending: Internal Medicine

## 2018-01-05 ENCOUNTER — Inpatient Hospital Stay (HOSPITAL_COMMUNITY): Payer: Medicare Other

## 2018-01-05 ENCOUNTER — Encounter (HOSPITAL_COMMUNITY): Payer: Self-pay | Admitting: Orthopedic Surgery

## 2018-01-05 DIAGNOSIS — G9349 Other encephalopathy: Secondary | ICD-10-CM

## 2018-01-05 DIAGNOSIS — E118 Type 2 diabetes mellitus with unspecified complications: Secondary | ICD-10-CM

## 2018-01-05 DIAGNOSIS — L89153 Pressure ulcer of sacral region, stage 3: Secondary | ICD-10-CM

## 2018-01-05 DIAGNOSIS — I5042 Chronic combined systolic (congestive) and diastolic (congestive) heart failure: Secondary | ICD-10-CM

## 2018-01-05 LAB — PROTIME-INR
INR: 2.23
PROTHROMBIN TIME: 24.5 s — AB (ref 11.4–15.2)

## 2018-01-05 LAB — CBC
HCT: 28.1 % — ABNORMAL LOW (ref 39.0–52.0)
Hemoglobin: 8.8 g/dL — ABNORMAL LOW (ref 13.0–17.0)
MCH: 27.7 pg (ref 26.0–34.0)
MCHC: 31.3 g/dL (ref 30.0–36.0)
MCV: 88.4 fL (ref 78.0–100.0)
PLATELETS: 422 10*3/uL — AB (ref 150–400)
RBC: 3.18 MIL/uL — ABNORMAL LOW (ref 4.22–5.81)
RDW: 16.3 % — AB (ref 11.5–15.5)
WBC: 11.1 10*3/uL — ABNORMAL HIGH (ref 4.0–10.5)

## 2018-01-05 LAB — HEPARIN LEVEL (UNFRACTIONATED): Heparin Unfractionated: 0.2 IU/mL — ABNORMAL LOW (ref 0.30–0.70)

## 2018-01-05 LAB — RENAL FUNCTION PANEL
Albumin: 2.1 g/dL — ABNORMAL LOW (ref 3.5–5.0)
Anion gap: 13 (ref 5–15)
BUN: 51 mg/dL — ABNORMAL HIGH (ref 6–20)
CO2: 26 mmol/L (ref 22–32)
Calcium: 9 mg/dL (ref 8.9–10.3)
Chloride: 99 mmol/L — ABNORMAL LOW (ref 101–111)
Creatinine, Ser: 6.15 mg/dL — ABNORMAL HIGH (ref 0.61–1.24)
GFR calc Af Amer: 10 mL/min — ABNORMAL LOW (ref >60)
GFR calc non Af Amer: 8 mL/min — ABNORMAL LOW (ref >60)
Glucose, Bld: 135 mg/dL — ABNORMAL HIGH (ref 65–99)
Phosphorus: 1 mg/dL — CL (ref 2.5–4.6)
Potassium: 3.9 mmol/L (ref 3.5–5.1)
Sodium: 138 mmol/L (ref 135–145)

## 2018-01-05 LAB — SURGICAL PCR SCREEN
MRSA, PCR: NEGATIVE
Staphylococcus aureus: NEGATIVE

## 2018-01-05 LAB — GLUCOSE, CAPILLARY
GLUCOSE-CAPILLARY: 131 mg/dL — AB (ref 65–99)
GLUCOSE-CAPILLARY: 131 mg/dL — AB (ref 65–99)
GLUCOSE-CAPILLARY: 194 mg/dL — AB (ref 65–99)
Glucose-Capillary: 106 mg/dL — ABNORMAL HIGH (ref 65–99)
Glucose-Capillary: 169 mg/dL — ABNORMAL HIGH (ref 65–99)
Glucose-Capillary: 174 mg/dL — ABNORMAL HIGH (ref 65–99)

## 2018-01-05 LAB — PROCALCITONIN: Procalcitonin: 3.93 ng/mL

## 2018-01-05 SURGERY — INSERTION OF DIALYSIS CATHETER
Anesthesia: General

## 2018-01-05 MED ORDER — SODIUM CHLORIDE 0.9 % IV SOLN: INTRAVENOUS | Status: DC | PRN

## 2018-01-05 MED ORDER — HEPARIN SODIUM (PORCINE) 1000 UNIT/ML IJ SOLN
INTRAMUSCULAR | Status: AC
  Filled 2018-01-05: qty 1

## 2018-01-05 MED ORDER — EPHEDRINE SULFATE 50 MG/ML IJ SOLN
INTRAMUSCULAR | Status: AC
  Filled 2018-01-05: qty 1

## 2018-01-05 MED ORDER — SODIUM CHLORIDE 0.9 % IV SOLN
INTRAVENOUS | Status: DC
  Administered 2018-01-05: 09:00:00 via INTRAVENOUS

## 2018-01-05 MED ORDER — LIDOCAINE 2% (20 MG/ML) 5 ML SYRINGE
INTRAMUSCULAR | Status: AC
  Filled 2018-01-05: qty 5

## 2018-01-05 MED ORDER — ETOMIDATE 2 MG/ML IV SOLN
INTRAVENOUS | Status: AC
  Filled 2018-01-05: qty 10

## 2018-01-05 MED ORDER — CHLORHEXIDINE GLUCONATE CLOTH 2 % EX PADS
6.0000 | MEDICATED_PAD | Freq: Every day | CUTANEOUS | Status: DC
  Administered 2018-01-06: 6 via TOPICAL

## 2018-01-05 MED ORDER — FENTANYL CITRATE (PF) 250 MCG/5ML IJ SOLN
INTRAMUSCULAR | Status: AC
  Filled 2018-01-05: qty 5

## 2018-01-05 MED ORDER — LIDOCAINE-EPINEPHRINE 0.5 %-1:200000 IJ SOLN
INTRAMUSCULAR | Status: AC
  Filled 2018-01-05: qty 1

## 2018-01-05 MED ORDER — LIDOCAINE-EPINEPHRINE (PF) 1 %-1:200000 IJ SOLN
INTRAMUSCULAR | Status: AC
  Filled 2018-01-05: qty 30

## 2018-01-05 MED ORDER — ONDANSETRON HCL 4 MG/2ML IJ SOLN
INTRAMUSCULAR | Status: AC
  Filled 2018-01-05: qty 2

## 2018-01-05 MED ORDER — PROPOFOL 10 MG/ML IV BOLUS
INTRAVENOUS | Status: AC
  Filled 2018-01-05: qty 20

## 2018-01-05 MED ORDER — POTASSIUM PHOSPHATES 15 MMOLE/5ML IV SOLN
40.0000 meq | Freq: Once | INTRAVENOUS | Status: AC
  Administered 2018-01-05: 40 meq via INTRAVENOUS
  Filled 2018-01-05: qty 9.09

## 2018-01-05 MED ORDER — CISATRACURIUM BESYLATE 20 MG/10ML IV SOLN
INTRAVENOUS | Status: AC
  Filled 2018-01-05: qty 10

## 2018-01-05 MED ORDER — SODIUM CHLORIDE 0.9 % IV SOLN
INTRAVENOUS | Status: AC
  Filled 2018-01-05: qty 1.2

## 2018-01-05 MED ORDER — METOPROLOL TARTRATE 25 MG/10 ML ORAL SUSPENSION
12.5000 mg | Freq: Two times a day (BID) | ORAL | Status: DC
  Administered 2018-01-05 – 2018-01-10 (×10): 12.5 mg
  Filled 2018-01-05 (×11): qty 10

## 2018-01-05 NOTE — Interval H&P Note (Signed)
History and Physical Interval Note:  01/05/2018 8:57 AM  Oscar Castillo  has presented today for surgery, with the diagnosis of end stage renal disease  The various methods of treatment have been discussed with the patient and family. After consideration of risks, benefits and other options for treatment, the patient has consented to  Procedure(s): INSERTION OF DIALYSIS CATHETER (N/A) as a surgical intervention .  The patient's history has been reviewed, patient examined, no change in status, stable for surgery.  I have reviewed the patient's chart and labs.  Questions were answered to the patient's satisfaction.     Ruta Hinds

## 2018-01-05 NOTE — Progress Notes (Signed)
Surgery cancelled today due to abn. Labs per Dr Oneida Alar

## 2018-01-05 NOTE — Progress Notes (Signed)
PROGRESS NOTE    Oscar Castillo  IDP:[id number] DOB: [birth year] DOA: 12/30/2017 PCP: [name], MD   Brief Narrative:  67 year old BM PMHx Bipolar disorder, [place] 2009, Chronic Encephalopathy, CHOLELITHIASIS, Chronic combined systolic and diastolic CHF, HTN, LBBB,PAF (paroxysmal atrial fibrillation). DIABETES MELLITUS, TYPE II uncontrolled with complication, ESRD on HD, GI bleed, Hepatitis C, Hx Costridium difficile, Hx Nephrolithiasis  Morbid obesity. Bedbound, bilateral BKA,S/P PEG tube placement.   Ongoing encephalopathy for the last few months, multiple recent admissions in the last 3 months, who was discharged from this hospital a month ago, stayed in [place] for a few weeks thereafter was discharged to SNF from where he was brought again to [facility] on 12/30/2017 for sepsis and encephalopathy, his encephalopathy seems to be acute on chronic after discussing with wife it appears like he has been confused more or less for the last 2 months.   He was seen and treated under pulmonary critical care from 12/30/2017 to 01/02/2018, has been transferred under my service on 01/02/2018.  Still encephalopathic.   Meantime blood cultures 1 out of 2 positive for coag negative staph, he did have extremely elevated procalcitonin with the left IJ tunneled dialysis catheter, case was discussed with ID and left IJ dialysis catheter was removed on 01/03/2018 plan is to place a temporary catheter on 01/05/2018 by vascular surgery for dialysis, give him a total of 10 days of IV antibiotics thereafter place a new tunneled catheter.  Overall extremely poor quality of life and still signs of sepsis, wife adamant to continue aggressive measures.      Subjective: 6/12 alert A/O x0 (does not know where, when, why) does not follow commands, will mumble unintelligible words at times.  Does say yes and ouch when pricked for insulin sticks.  Otherwise nonverbal.     Assessment & Plan:   Active  Problems:   Type 2 diabetes mellitus with chronic kidney disease on chronic dialysis, with long-term current use of insulin (HCC)   Essential (primary) hypertension   ESRD on hemodialysis (HCC)   S/P BKA (below knee amputation) bilateral (HCC)   Paroxysmal atrial fibrillation (HCC)   Anemia due to stage 5 chronic kidney disease ([place])   DM (diabetes mellitus) with peripheral vascular complication (HCC)   Severe sepsis (HCC)  Severe sepsis - No clear source. - Pneumonia?  On admission, however PCXR not consistent with pneumonia and repeat PCXR again does not support acute infection. - Mild leukocytosis with elevated procalcitonin however patient has ESRD on HD which could falsely elevate findings. - Patient does have Left BKA multiple ulcerations though ulcerations not consistent with osteomyelitis.  LLE x-ray pending. - Another possible site of infection is LEFT HD cath.  Removed by Dr. Gwenlyn Saran vascular surgery. Dr. Gwenlyn Saran vascular surgery will place temporary HD cath on 6/13 if patient's INR<1.8.  -1/2 blood cultures positive for coag negative staph: Contamination? -Per Dr. Gildardo Cranker note he discussed case with ID Dr. Dwyane Dee.  See above plan -Continue current antibiotic regimen empirically.    Acute on Chronic Encephalopathy - Patient had EXTREMELY poor prognosis, has been deteriorating over the last several months. - We will arrange family conference this week in to again address patient poor prognosis.  Per Dr. Gildardo Cranker note discussed case with patient's wife on 6/11 and she continues to have extremely poor understanding of patient's current long-term prognosis.  Continue aggressive measures for now. - Patient's Encephalopathy has been deteriorating over several months although patient has been receiving maximal treatment  for his multiple medical conditions without improvement therefore unlikely to improve.  Chronic systolic and diastolic CHF - EF= 95%, down from 50% in 2015  -Strict in and  out -Daily weight - Transfuse for hemoglobin<8 -Amiodarone 200 mg daily -Metoprolol 12.5 mg BID - Patient with elevated HT, Midodrine 5 mg BID (hold) - Repeat echocardiogram in 1 to 2 months if patient survives this hospitalization.  Paroxysmal atrial fibrillation -See CHF -Currently NSR -Coumadin(Hold).  Awaiting procedure on 6/13   Dysphasia - N.p.o. secondary to acute on chronic encephalopathy.  -PEG tube feeds  Enteritis -on CT scan.  C. difficile negative, GI pathogen panel negative.  Likely diarrhea due to osmolar diuresis from tube feeds.  Continue to monitor.  ESRD on HD M/W/F  -HD per nephrology   Bilateral BKA/stage II sacral decubitus ulcer - Care per wound care   Diabetes type 2 controlled with complication - 6/21 hemoglobin A1c= 6.8 -Sensitive SSI  HLD -Lipid panel pending -Statin       DVT prophylaxis: Heparin--> Coumadin on hold; HD cath placement on 6/13  Code Status: Full Family Communication: None Disposition Plan: TBD   Consultants:  PCCM Nephrology ID phone consult Vascular surgery    Procedures/Significant Events:     I have personally reviewed and interpreted all radiology studies and my findings are as above.  VENTILATOR SETTINGS:    Cultures   Antimicrobials: Anti-infectives (From admission, onward)   Start     Stop   01/06/18 1200  vancomycin (VANCOCIN) IVPB 1000 mg/200 mL premix         01/04/18 1200  vancomycin (VANCOCIN) IVPB 1000 mg/200 mL premix  Status:  Discontinued     01/03/18 1029   01/04/18 0915  vancomycin (VANCOCIN) 500 mg in sodium chloride 0.9 % 100 mL IVPB     01/04/18 1232   01/03/18 1200  vancomycin (VANCOCIN) IVPB 1000 mg/200 mL premix     01/03/18 1131   01/01/18 1200  vancomycin (VANCOCIN) IVPB 1000 mg/200 mL premix     01/02/18 0000   12/30/17 2200  piperacillin-tazobactam (ZOSYN) IVPB 3.375 g  Status:  Discontinued     01/03/18 1611   12/30/17 1315  vancomycin (VANCOCIN) 2,000 mg in sodium  chloride 0.9 % 500 mL IVPB     12/30/17 1517   12/30/17 1230  vancomycin (VANCOCIN) IVPB 1000 mg/200 mL premix  Status:  Discontinued     12/30/17 1229   12/30/17 1230  piperacillin-tazobactam (ZOSYN) IVPB 3.375 g     12/30/17 1314       Devices    LINES / TUBES:      Continuous Infusions: . [MAR Hold] sodium chloride Stopped (01/02/18 1800)  . sodium chloride 10 mL/hr at 01/05/18 0907  . heparin 1,400 Units/hr (01/05/18 0031)  . [MAR Hold] vancomycin       Objective: Vitals:   01/05/18 0459 01/05/18 0800 01/05/18 0828 01/05/18 0906  BP:   (!) 154/82   Pulse:      Resp:   17   Temp:  99.9 F (37.7 C)    TempSrc:  Axillary    SpO2:   99%   Weight: 203 lb 14.8 oz (92.5 kg)   203 lb 14.8 oz (92.5 kg)  Height:    5\' 10"  (1.778 m)    Intake/Output Summary (Last 24 hours) at 01/05/2018 0942 Last data filed at 01/05/2018 0000 Gross per 24 hour  Intake 944 ml  Output -  Net 944 ml   [employer]  01/04/18 0500 01/05/18 0459 01/05/18 0906  Weight: 201 lb 1 oz (91.2 kg) 203 lb 14.8 oz (92.5 kg) 203 lb 14.8 oz (92.5 kg)    Examination:  General: A/O x0 (does not know where, when, why) mumbles nonsensically, No acute respiratory distress Neck:  Negative scars, masses, torticollis, lymphadenopathy, JVD Lungs: Clear to auscultation bilaterally without wheezes or crackles Cardiovascular: Regular rate and rhythm without murmur gallop or rub normal S1 and S2 Abdomen: negative abdominal pain, nondistended, positive soft, bowel sounds, no rebound, no ascites, no appreciable mass, PEG tube in place negative sign of infection Extremities: bilateral BKA, LEFT BKA multiple lacerations/ulcers in various stages of healing.   Skin: Stage II sacral decubitus ulcer  Psychiatric: Evaluate secondary to acute on chronic encephalopathy Central nervous system: Moves all extremities, unable to evaluate further secondary to acute on chronic encephalopathy  .     Data Reviewed: Care  during the described time interval was provided by me .  I have reviewed this patient's available data, including medical history, events of note, physical examination, and all test results as part of my evaluation.   CBC: Recent Labs  Lab 12/30/17 1242  01/01/18 0328 01/02/18 0404 01/03/18 0513 01/04/18 0717 01/05/18 0820  WBC 10.4   < > 11.7* 9.6 9.3 9.0 11.1*  NEUTROABS 8.5*  --   --   --   --   --   --   HGB 11.4*   < > 9.2* 9.3* 8.8* 8.7* 8.8*  HCT 35.9*   < > 28.9* 29.0* 27.7* 27.7* 28.1*  MCV 88.6   < > 89.2 86.3 87.9 87.9 88.4  PLT 317   < > 280 318 343 374 422*   < > = values in this interval not displayed.   Basic Metabolic Panel: Recent Labs  Lab 12/30/17 1242 12/30/17 1243 12/31/17 0213 01/01/18 0328 01/02/18 0404 01/03/18 0720  NA 133* 131* 133* 133* 139 136  K 4.7 4.6 4.4 4.6 3.9 3.3*  CL 90* 94* 95* 94* 96* 97*  CO2 25  --  25 24 30 28   GLUCOSE 238* 240* 105* 179* 179* 191*  BUN 59* 54* 68* 88* 36* 66*  CREATININE 8.88* 8.20* 8.79* 10.07* 5.28* 6.92*  CALCIUM 9.3  --  8.7* 8.9 9.1 8.7*  MG  --   --  2.2 2.4 2.3  --   PHOS  --   --  3.0 2.7 1.2* 1.0*   GFR: Estimated Creatinine Clearance: 12 mL/min (A) (by C-G formula based on SCr of 6.92 mg/dL (H)). Liver Function Tests: Recent Labs  Lab 12/30/17 1242 01/03/18 0720  AST 19  --   ALT 14*  --   ALKPHOS 54  --   BILITOT 0.6  --   PROT 8.4*  --   ALBUMIN 3.0* 2.1*   No results for input(s): LIPASE, AMYLASE in the last 168 hours. No results for input(s): AMMONIA in the last 168 hours. Coagulation Profile: Recent Labs  Lab 01/01/18 0328 01/02/18 0404 01/03/18 0513 01/04/18 0717 01/05/18 0820  INR 7.03* 1.62 1.80 2.45 2.23   Cardiac Enzymes: Recent Labs  Lab 12/30/17 1449 12/30/17 1815 12/31/17 0213  TROPONINI 0.05* 0.05* 0.06*   BNP (last 3 results) No results for input(s): PROBNP in the last 8760 hours. HbA1C: No results for input(s): HGBA1C in the last 72 hours. CBG: Recent Labs   Lab 01/04/18 1637 01/04/18 1955 01/05/18 0010 01/05/18 0402 01/05/18 0802  GLUCAP 179* 191* 169* 131* 106*   Lipid  Profile: No results for input(s): CHOL, HDL, LDLCALC, TRIG, CHOLHDL, LDLDIRECT in the last 72 hours. Thyroid Function Tests: No results for input(s): TSH, T4TOTAL, FREET4, T3FREE, THYROIDAB in the last 72 hours. Anemia Panel: No results for input(s): VITAMINB12, FOLATE, FERRITIN, TIBC, IRON, RETICCTPCT in the last 72 hours. Urine analysis:    Component Value Date/Time   COLORURINE YELLOW 05/15/2013 0007   APPEARANCEUR CLEAR 05/15/2013 0007   LABSPEC 1.014 05/15/2013 0007   PHURINE 8.5 (H) 05/15/2013 0007   GLUCOSEU 250 (A) 05/15/2013 0007   GLUCOSEU 500 (?) 11/06/2009 0948   HGBUR NEGATIVE 05/15/2013 0007   BILIRUBINUR NEGATIVE 05/15/2013 0007   KETONESUR NEGATIVE 05/15/2013 0007   PROTEINUR >300 (A) 05/15/2013 0007   UROBILINOGEN 0.2 05/15/2013 0007   NITRITE NEGATIVE 05/15/2013 0007   LEUKOCYTESUR NEGATIVE 05/15/2013 0007   Sepsis Labs: @LABRCNTIP (procalcitonin:4,lacticidven:4)  ) Recent Results (from the past 240 hour(s))  Blood Culture (routine x 2)     Status: Abnormal   Collection Time: 12/30/17 12:54 PM  Result Value Ref Range Status   Specimen Description BLOOD SITE NOT SPECIFIED  Final   Special Requests   Final    BOTTLES DRAWN AEROBIC AND ANAEROBIC Blood Culture adequate volume   Culture  Setup Time   Final    GRAM POSITIVE COCCI IN CLUSTERS AEROBIC BOTTLE ONLY CRITICAL RESULT CALLED TO, READ BACK BY AND VERIFIED WITH: PHARMD E MARTIN [id number] MLM    Culture (A)  Final    STAPHYLOCOCCUS SPECIES (COAGULASE NEGATIVE) THE SIGNIFICANCE OF ISOLATING THIS ORGANISM FROM A SINGLE SET OF BLOOD CULTURES WHEN MULTIPLE SETS ARE DRAWN IS UNCERTAIN. PLEASE NOTIFY THE MICROBIOLOGY DEPARTMENT WITHIN ONE WEEK IF SPECIATION AND SENSITIVITIES ARE REQUIRED. Performed at [facility], [place] [address], [place], [place] [address]    Report Status  01/02/2018 FINAL  Final  Blood Culture ID Panel (Reflexed)     Status: Abnormal   Collection Time: 12/30/17 12:54 PM  Result Value Ref Range Status   Enterococcus species NOT DETECTED NOT DETECTED Final   Listeria monocytogenes NOT DETECTED NOT DETECTED Final   Staphylococcus species DETECTED (A) NOT DETECTED Final    Comment: Methicillin (oxacillin) resistant coagulase negative staphylococcus. Possible blood culture contaminant (unless isolated from more than one blood culture draw or clinical case suggests pathogenicity). No antibiotic treatment is indicated for blood  culture contaminants. CRITICAL RESULT CALLED TO, READ BACK BY AND VERIFIED WITH: PHARMD E MARTIN [id number] MLM    Staphylococcus aureus NOT DETECTED NOT DETECTED Final   Methicillin resistance DETECTED (A) NOT DETECTED Final    Comment: CRITICAL RESULT CALLED TO, READ BACK BY AND VERIFIED WITH: PHARMD E MARTIN [id number] MLM    Streptococcus species NOT DETECTED NOT DETECTED Final   Streptococcus agalactiae NOT DETECTED NOT DETECTED Final   Streptococcus pneumoniae NOT DETECTED NOT DETECTED Final   Streptococcus pyogenes NOT DETECTED NOT DETECTED Final   Acinetobacter baumannii NOT DETECTED NOT DETECTED Final   Enterobacteriaceae species NOT DETECTED NOT DETECTED Final   Enterobacter cloacae complex NOT DETECTED NOT DETECTED Final   Escherichia coli NOT DETECTED NOT DETECTED Final   Klebsiella oxytoca NOT DETECTED NOT DETECTED Final   Klebsiella pneumoniae NOT DETECTED NOT DETECTED Final   Proteus species NOT DETECTED NOT DETECTED Final   Serratia marcescens NOT DETECTED NOT DETECTED Final   Haemophilus influenzae NOT DETECTED NOT DETECTED Final   Neisseria meningitidis NOT DETECTED NOT DETECTED Final   Pseudomonas aeruginosa NOT DETECTED NOT DETECTED Final   Candida  albicans NOT DETECTED NOT DETECTED Final   Candida glabrata NOT DETECTED NOT DETECTED Final   Candida krusei NOT DETECTED NOT DETECTED Final    Candida parapsilosis NOT DETECTED NOT DETECTED Final   Candida tropicalis NOT DETECTED NOT DETECTED Final    Comment: Performed at [facility], [place] [address] [place], [place] [address]  Blood Culture (routine x 2)     Status: None   Collection Time: 12/30/17 12:55 PM  Result Value Ref Range Status   Specimen Description BLOOD RIGHT ANTECUBITAL  Final   Special Requests   Final    BOTTLES DRAWN AEROBIC AND ANAEROBIC Blood Culture adequate volume   Culture   Final    NO GROWTH 5 DAYS Performed at [facility], [place] [address] [place], [place] [address]    Report Status 01/04/2018 FINAL  Final  MRSA PCR Screening     Status: None   Collection Time: 12/30/17  3:16 PM  Result Value Ref Range Status   MRSA by PCR NEGATIVE NEGATIVE Final    Comment:        The GeneXpert MRSA Assay (FDA approved for NASAL specimens only), is one component of a comprehensive MRSA colonization surveillance program. It is not intended to diagnose MRSA infection nor to guide or monitor treatment for MRSA infections. Performed at [facility], [place] [address] [place], [place] [address]   C difficile quick scan w PCR reflex     Status: None   Collection Time: 01/01/18  8:09 AM  Result Value Ref Range Status   C Diff antigen NEGATIVE NEGATIVE Final   C Diff toxin NEGATIVE NEGATIVE Final   C Diff interpretation No C. difficile detected.  Final  Culture, blood (routine x 2)     Status: None (Preliminary result)   Collection Time: 01/02/18  8:43 AM  Result Value Ref Range Status   Specimen Description BLOOD RIGHT ANTECUBITAL  Final   Special Requests   Final    BOTTLES DRAWN AEROBIC AND ANAEROBIC Blood Culture adequate volume   Culture   Final    NO GROWTH 2 DAYS Performed at [facility], [address] [place], [place] [address]    Report Status PENDING  Incomplete  Culture, blood (routine x 2)     Status: None (Preliminary result)   Collection Time: 01/02/18  8:53 AM    Result Value Ref Range Status   Specimen Description BLOOD RIGHT ANTECUBITAL  Final   Special Requests   Final    BOTTLES DRAWN AEROBIC AND ANAEROBIC Blood Culture adequate volume   Culture   Final    NO GROWTH 2 DAYS Performed at [facility], [place] [address] [place], [place] [address]    Report Status PENDING  Incomplete  Culture, blood (routine x 2)     Status: None (Preliminary result)   Collection Time: 01/03/18  1:52 PM  Result Value Ref Range Status   Specimen Description BLOOD RIGHT HAND  Final   Special Requests   Final    BOTTLES DRAWN AEROBIC ONLY Blood Culture adequate volume   Culture   Final    NO GROWTH < 24 HOURS Performed at [facility], [place] [address] [place], [place] [address]    Report Status PENDING  Incomplete  Culture, blood (routine x 2)     Status: None (Preliminary result)   Collection Time: 01/03/18  1:52 PM  Result Value Ref Range Status   Specimen  Description BLOOD RIGHT HAND  Final   Special Requests   Final    BOTTLES DRAWN AEROBIC ONLY Blood Culture results may not be optimal due to an inadequate volume of blood received in culture bottles   Culture   Final    NO GROWTH < 24 HOURS Performed at [place] [address], [place], [place] [address]    Report Status PENDING  Incomplete         Radiology Studies: No results found.      Scheduled Meds: . [MAR Hold] amiodarone  200 mg Per Tube Daily  . [MAR Hold] atorvastatin  40 mg Per Tube QHS  . [MAR Hold] Chlorhexidine Gluconate Cloth  6 each Topical Q0600  . [MAR Hold] darbepoetin (ARANESP) injection - DIALYSIS  60 mcg Intravenous Q Tue-HD  . [MAR Hold] escitalopram  10 mg Per Tube Daily  . [MAR Hold] famotidine  20 mg Oral QHS  . [MAR Hold] feeding supplement (NEPRO CARB STEADY)  1,000 mL Per Tube Q24H  . [MAR Hold] feeding supplement (PRO-STAT SUGAR FREE 64)  30 mL Per Tube TID  . [MAR Hold] insulin aspart  0-9 Units Subcutaneous Q4H  . [MAR Hold]  latanoprost  1 drop Both Eyes QHS  . [MAR Hold] mouth rinse  15 mL Mouth Rinse BID  . [MAR Hold] midodrine  5 mg Per Tube BID WC   Continuous Infusions: . [MAR Hold] sodium chloride Stopped (01/02/18 1800)  . sodium chloride 10 mL/hr at 01/05/18 0907  . heparin 1,400 Units/hr (01/05/18 0031)  . [MAR Hold] vancomycin       LOS: 6 days    Time spent: 40 minutes    WOODS, Geraldo Docker, MD [facility] Pager [phone]   If 7PM-7AM, please contact night-coverage [email or web address] Password [email or web address] 01/05/2018, 9:42 AM

## 2018-01-05 NOTE — Consult Note (Addendum)
   [facility] CM Inpatient Consult   01/05/2018  Oscar Castillo 18-Mar-1951 [id number]     Patient screened for potential [facility] Care Management program due to risk of unplanned readmission risk score of 33% (extreme).  Chart reviewed. Reviewed palliative consult notes. Spoke with inpatient RNCM. Disposition plans are pending.   Will engage for [place] Management if appropriate.   Marthenia Rolling, MSN-Ed, RN,BSN [facility] Liaison [phone]

## 2018-01-05 NOTE — Progress Notes (Addendum)
[facility] Progress Note  Dialysis Orders: [facility]  4h 88kg 2/2 bath p4 L TDC heparin 4000 then 2000 midrun -venofer 100 mg x 10 to start 6/8  Assessment/Plan: 1Fevers/ sepsis - scant basilar infiltrates at bases on CT. BCx 1/4+ for MRSE. OnIVabx.BC 6/9 and 6/10 no growth so far . Recurrent unexplained fevers in pt with chronic tunneled HD catheter -- catheter removed 6/10- still having fevers though not quite as high as yesterday- temp this am 99.9 Catheter replacement today postponed because INR 2.2 - vit K given - tomorrow should be fine for catheter replacement - no urgent need for HD.  Need to xray left BKA - marked tenderness/distal wound and also scabbed area- very tender - ordered xray- could explain persistent fevers. On Vanc, last Zosyn 6/10 2 Confusion -not answering any questions today 3 Esrd on HD TTS.HD 6/10 - next tmt planned Thursday - K 3.9  4 Hypotension/volume - bp's are usually normal at OP HD. Midodrine addedhere. BP's are labile.net UF 725 6/10- has some generalized volulme 5 Anemia ckd - Hgb trending down8.8 now stable- significant decline since admission 60 Aranesp given 6/10. Holding IV Fe here for now. repeat FOBT + no heparin HD -  6 mbd ckd -  P 1 still low - replete- stopped binders 7 Afib - on anticoag with + hemocult 8 DM2 9 FTT - failure to thrive, Appreciate palliative care consult - at this point - his wife wishes to not focus on comfort care, but to do everything as this as what patient had expressed prior to being unable to express his wishes.; have discussed with pt's wife - who believes once the source of infection has been identified, she expects him to improve 10. Nutrition - nepro at 40 per PEG/prostat alb 2.1  11. Stage 2 Decub - stooling with rectal tube  Myriam Jacobson, PA-C [facility] Beeper [phone] 01/05/2018,10:27 AM  LOS: 6 days   Pt seen, examined and agree w A/P as above.  Kelly Splinter MD [facility] pager [phone]   01/05/2018, 12:54 PM    Subjective:   Not anwering questions  Objective Vitals:   01/05/18 0459 01/05/18 0800 01/05/18 0828 01/05/18 0906  BP:   (!) 154/82   Pulse:      Resp:   17   Temp:  99.9 F (37.7 C)    TempSrc:  Axillary    SpO2:   99%   Weight: 92.5 kg (203 lb 14.8 oz)   92.5 kg (203 lb 14.8 oz)  Height:    5\' 10"  (1.778 m)   Physical Exam General: NAD supine in room generalized puffiness, eyes barely open to questioning, mumbles Heart: RRR Lungs: grossly clear Abdomen: soft Extremities: bilateral BKA left distal stump scabbed and also open wound - very tender to touch  Dialysis Access:  none   Additional Objective Labs: Basic Metabolic Panel: Recent Labs  Lab 01/02/18 0404 01/03/18 0720 01/05/18 0820  NA 139 136 138  K 3.9 3.3* 3.9  CL 96* 97* 99*  CO2 30 28 26   GLUCOSE 179* 191* 135*  BUN 36* 66* 51*  CREATININE 5.28* 6.92* 6.15*  CALCIUM 9.1 8.7* 9.0  PHOS 1.2* 1.0* 1.0*   Liver Function Tests: Recent Labs  Lab 12/30/17 1242 01/03/18 0720 01/05/18 0820  AST 19  --   --   ALT 14*  --   --   ALKPHOS 54  --   --   BILITOT  0.6  --   --   PROT 8.4*  --   --   ALBUMIN 3.0* 2.1* 2.1*   No results for input(s): LIPASE, AMYLASE in the last 168 hours. CBC: Recent Labs  Lab 12/30/17 1242  01/01/18 0328 01/02/18 0404 01/03/18 0513 01/04/18 0717 01/05/18 0820  WBC 10.4   < > 11.7* 9.6 9.3 9.0 11.1*  NEUTROABS 8.5*  --   --   --   --   --   --   HGB 11.4*   < > 9.2* 9.3* 8.8* 8.7* 8.8*  HCT 35.9*   < > 28.9* 29.0* 27.7* 27.7* 28.1*  MCV 88.6   < > 89.2 86.3 87.9 87.9 88.4  PLT 317   < > 280 318 343 374 422*   < > = values in this interval not displayed.   Blood Culture    Component Value Date/Time   SDES BLOOD RIGHT HAND 01/03/2018 1352   SDES BLOOD RIGHT HAND 01/03/2018 1352   SPECREQUEST  01/03/2018 1352    BOTTLES DRAWN AEROBIC ONLY Blood Culture adequate volume    SPECREQUEST  01/03/2018 1352    BOTTLES DRAWN AEROBIC ONLY Blood Culture results may not be optimal due to an inadequate volume of blood received in culture bottles   CULT  01/03/2018 1352    NO GROWTH < 24 HOURS Performed at [facility], [place] [address], [place], [name] [address]    CULT  01/03/2018 1352    NO GROWTH < 24 HOURS Performed at [facility], [place] [address], [place], [place] [address]    REPTSTATUS PENDING 01/03/2018 1352   REPTSTATUS PENDING 01/03/2018 1352    Cardiac Enzymes: Recent Labs  Lab 12/30/17 1449 12/30/17 1815 12/31/17 0213  TROPONINI 0.05* 0.05* 0.06*   CBG: Recent Labs  Lab 01/04/18 1637 01/04/18 1955 01/05/18 0010 01/05/18 0402 01/05/18 0802  GLUCAP 179* 191* 169* 131* 106*   Iron Studies: No results for input(s): IRON, TIBC, TRANSFERRIN, FERRITIN in the last 72 hours. Lab Results  Component Value Date   INR 2.23 01/05/2018   INR 2.45 01/04/2018   INR 1.80 01/03/2018   Studies/Results: No results found. Medications: . [MAR Hold] sodium chloride Stopped (01/02/18 1800)  . sodium chloride 10 mL/hr at 01/05/18 0907  . heparin 1,400 Units/hr (01/05/18 0031)  . [MAR Hold] vancomycin     . [MAR Hold] amiodarone  200 mg Per Tube Daily  . [MAR Hold] atorvastatin  40 mg Per Tube QHS  . [MAR Hold] Chlorhexidine Gluconate Cloth  6 each Topical Q0600  . [MAR Hold] darbepoetin (ARANESP) injection - DIALYSIS  60 mcg Intravenous Q Tue-HD  . [MAR Hold] escitalopram  10 mg Per Tube Daily  . [MAR Hold] famotidine  20 mg Oral QHS  . [MAR Hold] feeding supplement (NEPRO CARB STEADY)  1,000 mL Per Tube Q24H  . [MAR Hold] feeding supplement (PRO-STAT SUGAR FREE 64)  30 mL Per Tube TID  . [MAR Hold] insulin aspart  0-9 Units Subcutaneous Q4H  . [MAR Hold] latanoprost  1 drop Both Eyes QHS  . [MAR Hold] mouth rinse  15 mL Mouth Rinse BID  . [MAR Hold] midodrine  5 mg Per Tube BID WC

## 2018-01-05 NOTE — Anesthesia Preprocedure Evaluation (Addendum)
Anesthesia Evaluation  Patient identified by MRN, date of birth, ID band Patient confused    Reviewed: Allergy & Precautions, NPO status , Patient's Chart, lab work & pertinent test results  Airway Mallampati: II  TM Distance: >3 FB Neck ROM: Full    Dental  (+) Upper Dentures   Pulmonary neg pulmonary ROS,    Pulmonary exam normal breath sounds clear to auscultation       Cardiovascular hypertension, +CHF  Normal cardiovascular exam+ dysrhythmias Atrial Fibrillation  Rhythm:Regular Rate:Normal  ECG: A-tach, rate 106. LBBB  ECHO: LV EF: 25% -   30% Left ventricle: Poor acoustic windows limit study LVEF is depressed at approximately 30 to 35% with severe hypokinesis/ akiensis of the septal wall and inferior wall The cavity size was normal. Wall thickness was increased in a pattern of moderate LVH. The estimated ejection fraction was in the range of 25% to 30%. Aortic valve: AV is thickened, calcified with restricted motion Peak gradient through the valve is 15 mm. 2 d imaging suggests that AS is moderate Dimensionless index is 0.59, consistent with mild to moderate AS> Mitral valve: Calcified annulus. Mildly thickened leaflets . There was mild regurgitation.    Neuro/Psych PSYCHIATRIC DISORDERS Depression Bipolar Disorder CVA    GI/Hepatic negative GI ROS, (+) Hepatitis -, C  Endo/Other  diabetes, Insulin Dependent  Renal/GU ESRF and DialysisRenal diseaseOn HD T, R, Sat     Musculoskeletal negative musculoskeletal ROS (+)   Abdominal (+) - obese,   Peds  Hematology  (+) anemia , HLD   Anesthesia Other Findings end stage renal disease  Reproductive/Obstetrics                            Anesthesia Physical Anesthesia Plan  ASA: IV  Anesthesia Plan: General   Post-op Pain Management:    Induction: Intravenous  PONV Risk Score and Plan: 2 and Ondansetron, Dexamethasone and Treatment  may vary due to age or medical condition  Airway Management Planned: Oral ETT  Additional Equipment:   Intra-op Plan:   Post-operative Plan: Extubation in OR  Informed Consent: I have reviewed the patients History and Physical, chart, labs and discussed the procedure including the risks, benefits and alternatives for the proposed anesthesia with the patient or authorized representative who has indicated his/her understanding and acceptance.   Dental advisory given  Plan Discussed with: CRNA  Anesthesia Plan Comments:        Anesthesia Quick Evaluation

## 2018-01-05 NOTE — Progress Notes (Signed)
Pt INR > 2.2 today.  Will cancel tunneled dialysis catheter and reschedule when INR < 1.8.  If urgent dialysis prior to this would need temp cath  Consider Vitamin K per primary team but with issues around palliation etc will let them decide this.  Ruta Hinds, MD [facility] Office: [phone] Pager: [phone]

## 2018-01-05 NOTE — Progress Notes (Addendum)
ANTICOAGULATION CONSULT NOTE - Follow Up Consult  Pharmacy Consult for Heparin Indication: PAF with h/o CVA  Allergies  Allergen Reactions  . Ceftriaxone Sodium In Dextrose Anaphylaxis    Tolerates Zosyn  . Ciprofloxacin Other (See Comments)    UNSPECIFIED PAIN  . Nsaids Other (See Comments)    HISTORY OF ULCER  . Penicillins Swelling    Tolerates Zosyn [ PATIENT WITH HX OF ANAPHYLAXIS TO CEFTRIAXONE ] 11/26/17:  pt has received 3 doses of zosyn in past (04/2017.) No allergy tor adverse event to zosyn noted at that time.     . Sulfa Antibiotics Swelling    SWELLING REACTION UNSPECIFIED     Patient Measurements: Height: 5\' 10"  (177.8 cm) Weight: 203 lb 14.8 oz (92.5 kg) IBW/kg (Calculated) : 73 Heparin Dosing Weight:    Vital Signs: Temp: 99.9 F (37.7 C) (06/12 0800) Temp Source: Axillary (06/12 0800) BP: 154/82 (06/12 0828)  Labs: Recent Labs    01/03/18 0513 01/03/18 0720 01/03/18 1352 01/04/18 0717 01/05/18 0820  HGB 8.8*  --   --  8.7* 8.8*  HCT 27.7*  --   --  27.7* 28.1*  PLT 343  --   --  374 422*  LABPROT 20.8*  --   --  26.4* 24.5*  INR 1.80  --   --  2.45 2.23  HEPARINUNFRC 0.55  --  0.67 0.32 0.20*  CREATININE  --  6.92*  --   --  6.15*    Estimated Creatinine Clearance: 13.5 mL/min (A) (by C-G formula based on SCr of 6.15 mg/dL (H)).   Assessment:  Anticoag: Warf PTA for PAF with hx CVA - INR 3.17 on admit, rose to 7.03, then <2 after Vit K on 6/8.  Hep bridge initiated. H/H low,  Plt wnl  - Per MAR in shadow chart, just increased Coumadin from 5 to 5.5 mg daily on 6/3 - HL 0.2, INR 2.23 trended back up, Hgb and plts stable from yesterday. Dr. Candiss Norse did not want to give more Vit K on 6/11. Procedure cancelled 6/12 because INR still elevated. +FOBT  Goal of Therapy:  Heparin level 0.3-0.7 units/ml Monitor platelets by anticoagulation protocol: Yes   Plan:  - Heparin currently off this AM for procedure>cancelled> do not resume per Dr. Sherral Hammers  until [place]. - Paged Dr. Sherral Hammers about additional Vit K-no return call yet. -Daily heparin level and CBC -Hold Coumadin until after line placement  Diana Davenport S. Alford Highland, PharmD, BCPS Clinical Staff Pharmacist Pager [phone]  Eilene Ghazi Stillinger 01/05/2018,2:22 PM

## 2018-01-06 ENCOUNTER — Encounter (HOSPITAL_COMMUNITY)
Admission: EM | Disposition: A | Discharge status: Nursing Home (Includes ICF) | Payer: Self-pay | Source: Skilled Nursing Facility | Attending: Internal Medicine

## 2018-01-06 ENCOUNTER — Telehealth (INDEPENDENT_AMBULATORY_CARE_PROVIDER_SITE_OTHER): Payer: Self-pay

## 2018-01-06 ENCOUNTER — Inpatient Hospital Stay (HOSPITAL_COMMUNITY): Payer: Medicare Other

## 2018-01-06 LAB — CBC
HEMATOCRIT: 28.4 % — AB (ref 39.0–52.0)
Hemoglobin: 9.2 g/dL — ABNORMAL LOW (ref 13.0–17.0)
MCH: 27.8 pg (ref 26.0–34.0)
MCHC: 32.4 g/dL (ref 30.0–36.0)
MCV: 85.8 fL (ref 78.0–100.0)
Platelets: 508 10*3/uL — ABNORMAL HIGH (ref 150–400)
RBC: 3.31 MIL/uL — AB (ref 4.22–5.81)
RDW: 16.2 % — ABNORMAL HIGH (ref 11.5–15.5)
WBC: 10.1 10*3/uL (ref 4.0–10.5)

## 2018-01-06 LAB — BASIC METABOLIC PANEL
ANION GAP: 12 (ref 5–15)
BUN: 69 mg/dL — ABNORMAL HIGH (ref 6–20)
CALCIUM: 8.9 mg/dL (ref 8.9–10.3)
CO2: 26 mmol/L (ref 22–32)
Chloride: 99 mmol/L — ABNORMAL LOW (ref 101–111)
Creatinine, Ser: 7.66 mg/dL — ABNORMAL HIGH (ref 0.61–1.24)
GFR, EST AFRICAN AMERICAN: 8 mL/min — AB (ref >60)
GFR, EST NON AFRICAN AMERICAN: 7 mL/min — AB (ref >60)
Glucose, Bld: 152 mg/dL — ABNORMAL HIGH (ref 65–99)
Potassium: 4.2 mmol/L (ref 3.5–5.1)
SODIUM: 137 mmol/L (ref 135–145)

## 2018-01-06 LAB — LIPID PANEL
CHOLESTEROL: 68 mg/dL (ref 0–200)
HDL: 19 mg/dL — ABNORMAL LOW (ref >40)
LDL Cholesterol: 30 mg/dL (ref 0–99)
TRIGLYCERIDES: 93 mg/dL (ref <150)
Total CHOL/HDL Ratio: 3.6 RATIO
VLDL: 19 mg/dL (ref 0–40)

## 2018-01-06 LAB — GLUCOSE, CAPILLARY
GLUCOSE-CAPILLARY: 140 mg/dL — AB (ref 65–99)
GLUCOSE-CAPILLARY: 172 mg/dL — AB (ref 65–99)
Glucose-Capillary: 142 mg/dL — ABNORMAL HIGH (ref 65–99)
Glucose-Capillary: 145 mg/dL — ABNORMAL HIGH (ref 65–99)
Glucose-Capillary: 155 mg/dL — ABNORMAL HIGH (ref 65–99)
Glucose-Capillary: 165 mg/dL — ABNORMAL HIGH (ref 65–99)
Glucose-Capillary: 186 mg/dL — ABNORMAL HIGH (ref 65–99)

## 2018-01-06 LAB — PROCALCITONIN: PROCALCITONIN: 2.97 ng/mL

## 2018-01-06 LAB — PHOSPHORUS: Phosphorus: 2.4 mg/dL — ABNORMAL LOW (ref 2.5–4.6)

## 2018-01-06 LAB — PROTIME-INR
INR: 2.37
Prothrombin Time: 25.7 seconds — ABNORMAL HIGH (ref 11.4–15.2)

## 2018-01-06 LAB — MAGNESIUM: MAGNESIUM: 2.2 mg/dL (ref 1.7–2.4)

## 2018-01-06 SURGERY — INSERTION OF DIALYSIS CATHETER
Anesthesia: Choice

## 2018-01-06 MED ORDER — HYDRALAZINE HCL 20 MG/ML IJ SOLN
5.0000 mg | Freq: Once | INTRAMUSCULAR | Status: AC
  Administered 2018-01-06: 5 mg via INTRAVENOUS
  Filled 2018-01-06: qty 1

## 2018-01-06 MED ORDER — PHYTONADIONE 5 MG PO TABS
5.0000 mg | ORAL_TABLET | Freq: Once | ORAL | Status: AC
  Administered 2018-01-06: 5 mg via ORAL
  Filled 2018-01-06: qty 1

## 2018-01-06 NOTE — Telephone Encounter (Signed)
Discussed with Dr. Sharol Given he is aware.

## 2018-01-06 NOTE — Progress Notes (Addendum)
PROGRESS NOTE    Oscar Castillo  DDU:[id number] DOB: Sep 29, 1950 DOA: 12/30/2017 PCP: [name], MD   Brief Narrative:  67 year old BM PMHx Bipolar disorder, [place] 2009, Chronic Encephalopathy, CHOLELITHIASIS, Chronic combined systolic and diastolic CHF, HTN, LBBB,PAF (paroxysmal atrial fibrillation). DIABETES MELLITUS, TYPE II uncontrolled with complication, ESRD on HD, GI bleed, Hepatitis C, Hx Costridium difficile, Hx Nephrolithiasis  Morbid obesity. Bedbound, bilateral BKA,S/P PEG tube placement.   Ongoing encephalopathy for the last few months, multiple recent admissions in the last 3 months, who was discharged from this hospital a month ago, stayed in [place] for a few weeks thereafter was discharged to SNF from where he was brought again to [facility] on 12/30/2017 for sepsis and encephalopathy, his encephalopathy seems to be acute on chronic after discussing with wife it appears like he has been confused more or less for the last 2 months.   He was seen and treated under pulmonary critical care from 12/30/2017 to 01/02/2018, has been transferred under my service on 01/02/2018.  Still encephalopathic.   Meantime blood cultures 1 out of 2 positive for coag negative staph, he did have extremely elevated procalcitonin with the left IJ tunneled dialysis catheter, case was discussed with ID and left IJ dialysis catheter was removed on 01/03/2018 plan is to place a temporary catheter on 01/05/2018 by vascular surgery for dialysis, give him a total of 10 days of IV antibiotics thereafter place a new tunneled catheter.  Overall extremely poor quality of life and still signs of sepsis, wife adamant to continue aggressive measures.      Subjective: 6/13 A/O x1 (does not know where, when, why).  Does not follow commands.  Answers yes to all questions or babbles unintelligibly.    Assessment & Plan:   Active Problems:   Type 2 diabetes mellitus with chronic kidney disease on  chronic dialysis, with long-term current use of insulin (HCC)   Essential (primary) hypertension   ESRD on hemodialysis (HCC)   S/P BKA (below knee amputation) bilateral (HCC)   Paroxysmal atrial fibrillation (HCC)   Anemia due to stage 5 chronic kidney disease ([place])   DM (diabetes mellitus) with peripheral vascular complication (HCC)   Severe sepsis (HCC)  Severe sepsis - No clear source. - Pneumonia?  On admission, however PCXR not consistent with pneumonia and repeat PCXR again does not support acute infection. - Mild leukocytosis with elevated procalcitonin however patient has ESRD on HD which could falsely elevate findings. - Patient does have Left BKA multiple ulcerations though ulcerations not consistent with osteomyelitis.  LLE x-ray pending. - Another possible site of infection is LEFT HD cath.  Removed by Dr. Gwenlyn Saran vascular surgery. Dr. Gwenlyn Saran vascular surgery will place temporary HD cath on 6/13 if patient's INR<1.8.  -1/2 blood cultures positive for coag negative staph: Contamination? -Per Dr. Gildardo Cranker note he discussed case with ID Dr. Dwyane Dee.  See above plan -Continue current antibiotic regimen empirically.    Acute on Chronic Encephalopathy - Patient had EXTREMELY poor prognosis, has been deteriorating over the last several months. - We will arrange family conference this week in to again address patient poor prognosis.  Per Dr. Gildardo Cranker note discussed case with patient's wife on 6/11 and she continues to have extremely poor understanding of patient's current long-term prognosis.  Continue aggressive measures for now. - Patient's Encephalopathy has been deteriorating over several months although patient has been receiving maximal treatment for his multiple medical conditions without improvement therefore unlikely to improve.  Chronic systolic and diastolic CHF - EF= 99%, down from 50% in 2015  -Strict in and out -Daily weight - Transfuse for hemoglobin<8 -Amiodarone 200 mg  daily -Metoprolol 12.5 mg BID - Patient with elevated HT, Midodrine 5 mg BID (hold) - Repeat echocardiogram in 1 to 2 months if patient survives this hospitalization.  Paroxysmal atrial fibrillation -See CHF -Currently NSR -Coumadin(Hold).  Awaiting procedure on 6/13   Dysphasia - N.p.o. secondary to acute on chronic encephalopathy.  -PEG tube feeds  Enteritis -on CT scan.  C. difficile negative, GI pathogen panel negative.  Likely diarrhea due to osmolar diuresis from tube feeds.  Continue to monitor.  ESRD on HD T/Th/Sat  -HD per nephrology   Bilateral BKA/stage II sacral decubitus ulcer -Cleanse wounds to bilateral lower legs with soap and water and pat dry. SIlicone foam dressing to pad and protect. Change every three days and PRN soilage.   LEFT LOWER extremity lacerations - Patient with multiple LLE lacerations on his BKA stump in various stages of healing tender to palpation. - See sacral decubitus ulcer  Diabetes type 2 controlled with complication - 8/33 hemoglobin A1c= 6.8 -Sensitive SSI  HLD -Lipid panel within ADA guidelines. -Lipitor 40 mg daily      DVT prophylaxis: Heparin--> Coumadin on hold; HD cath placement on 6/13  Code Status: Full Family Communication: None Disposition Plan: TBD   Consultants:  PCCM Nephrology ID phone consult Vascular surgery WOC nurse   Procedures/Significant Events:     I have personally reviewed and interpreted all radiology studies and my findings are as above.  VENTILATOR SETTINGS:    Cultures   Antimicrobials: Anti-infectives (From admission, onward)   Start     Stop   01/06/18 1200  vancomycin (VANCOCIN) IVPB 1000 mg/200 mL premix         01/04/18 1200  vancomycin (VANCOCIN) IVPB 1000 mg/200 mL premix  Status:  Discontinued     01/03/18 1029   01/04/18 0915  vancomycin (VANCOCIN) 500 mg in sodium chloride 0.9 % 100 mL IVPB     01/04/18 1232   01/03/18 1200  vancomycin (VANCOCIN) IVPB 1000 mg/200  mL premix     01/03/18 1131   01/01/18 1200  vancomycin (VANCOCIN) IVPB 1000 mg/200 mL premix     01/02/18 0000   12/30/17 2200  piperacillin-tazobactam (ZOSYN) IVPB 3.375 g  Status:  Discontinued     01/03/18 1611   12/30/17 1315  vancomycin (VANCOCIN) 2,000 mg in sodium chloride 0.9 % 500 mL IVPB     12/30/17 1517   12/30/17 1230  vancomycin (VANCOCIN) IVPB 1000 mg/200 mL premix  Status:  Discontinued     12/30/17 1229   12/30/17 1230  piperacillin-tazobactam (ZOSYN) IVPB 3.375 g     12/30/17 1314       Devices    LINES / TUBES:      Continuous Infusions: . sodium chloride Stopped (01/02/18 1800)  . sodium chloride 10 mL/hr at 01/05/18 0907  . vancomycin       Objective: Vitals:   01/06/18 0419 01/06/18 0424 01/06/18 0519 01/06/18 0800  BP:  (!) 173/87 (!) 162/83 (!) 145/89  Pulse:  81  85  Resp:  (!) 24 18 17   Temp: 98.8 F (37.1 C)   98.3 F (36.8 C)  TempSrc: Axillary   Axillary  SpO2:  97%  99%  Weight:   201 lb 8 oz (91.4 kg)   Height:        Intake/Output Summary (Last  24 hours) at 01/06/2018 0944 Last data filed at 01/06/2018 0244 Gross per 24 hour  Intake 929 ml  Output 1 ml  Net 928 ml   Filed Weights   01/05/18 0459 01/05/18 0906 01/06/18 0519  Weight: 203 lb 14.8 oz (92.5 kg) 203 lb 14.8 oz (92.5 kg) 201 lb 8 oz (91.4 kg)    Physical Exam:  General:  A/O x1 (does not know where, when, why) mumbles nonsensically, No acute respiratory distress Neck:  Negative scars, masses, torticollis, lymphadenopathy, JVD Lungs: Clear to auscultation bilaterally without wheezes or crackles Cardiovascular: Regular rate and rhythm without murmur gallop or rub normal S1 and S2 Abdomen: negative abdominal pain, nondistended, positive soft, bowel sounds, no rebound, no ascites, no appreciable mass, PEG tube in place negative sign of infection Extremities: bilateral BKA, LEFT BKA multiple lacerations/ulcers in various stages of healing.   Skin: Stage II sacral  decubitus ulcer  Psychiatric: Evaluate secondary to acute on chronic encephalopathy Central nervous system: Moves all extremities, unable to evaluate further secondary to acute on chronic encephalopathy  .     Data Reviewed: Care during the described time interval was provided by me .  I have reviewed this patient's available data, including medical history, events of note, physical examination, and all test results as part of my evaluation.   CBC: Recent Labs  Lab 12/30/17 1242  01/02/18 0404 01/03/18 0513 01/04/18 0717 01/05/18 0820 01/06/18 0636  WBC 10.4   < > 9.6 9.3 9.0 11.1* 10.1  NEUTROABS 8.5*  --   --   --   --   --   --   HGB 11.4*   < > 9.3* 8.8* 8.7* 8.8* 9.2*  HCT 35.9*   < > 29.0* 27.7* 27.7* 28.1* 28.4*  MCV 88.6   < > 86.3 87.9 87.9 88.4 85.8  PLT 317   < > 318 343 374 422* 508*   < > = values in this interval not displayed.   Basic Metabolic Panel: Recent Labs  Lab 12/31/17 0213 01/01/18 0328 01/02/18 0404 01/03/18 0720 01/05/18 0820 01/06/18 0636  NA 133* 133* 139 136 138 137  K 4.4 4.6 3.9 3.3* 3.9 4.2  CL 95* 94* 96* 97* 99* 99*  CO2 25 24 30 28 26 26   GLUCOSE 105* 179* 179* 191* 135* 152*  BUN 68* 88* 36* 66* 51* 69*  CREATININE 8.79* 10.07* 5.28* 6.92* 6.15* 7.66*  CALCIUM 8.7* 8.9 9.1 8.7* 9.0 8.9  MG 2.2 2.4 2.3  --   --  2.2  PHOS 3.0 2.7 1.2* 1.0* 1.0* 2.4*   GFR: Estimated Creatinine Clearance: 10.8 mL/min (A) (by C-G formula based on SCr of 7.66 mg/dL (H)). Liver Function Tests: Recent Labs  Lab 12/30/17 1242 01/03/18 0720 01/05/18 0820  AST 19  --   --   ALT 14*  --   --   ALKPHOS 54  --   --   BILITOT 0.6  --   --   PROT 8.4*  --   --   ALBUMIN 3.0* 2.1* 2.1*   No results for input(s): LIPASE, AMYLASE in the last 168 hours. No results for input(s): AMMONIA in the last 168 hours. Coagulation Profile: Recent Labs  Lab 01/02/18 0404 01/03/18 0513 01/04/18 0717 01/05/18 0820 01/06/18 0636  INR 1.62 1.80 2.45 2.23 2.37     Cardiac Enzymes: Recent Labs  Lab 12/30/17 1449 12/30/17 1815 12/31/17 0213  TROPONINI 0.05* 0.05* 0.06*   BNP (last 3 results) No results  for input(s): PROBNP in the last 8760 hours. HbA1C: No results for input(s): HGBA1C in the last 72 hours. CBG: Recent Labs  Lab 01/05/18 1626 01/05/18 2012 01/05/18 2350 01/06/18 0417 01/06/18 0802  GLUCAP 174* 194* 165* 155* 142*   Lipid Profile: Recent Labs    01/06/18 0636  CHOL 68  HDL 19*  LDLCALC 30  TRIG 93  CHOLHDL 3.6   Thyroid Function Tests: No results for input(s): TSH, T4TOTAL, FREET4, T3FREE, THYROIDAB in the last 72 hours. Anemia Panel: No results for input(s): VITAMINB12, FOLATE, FERRITIN, TIBC, IRON, RETICCTPCT in the last 72 hours. Urine analysis:    Component Value Date/Time   COLORURINE YELLOW 05/15/2013 0007   APPEARANCEUR CLEAR 05/15/2013 0007   LABSPEC 1.014 05/15/2013 0007   PHURINE 8.5 (H) 05/15/2013 0007   GLUCOSEU 250 (A) 05/15/2013 0007   GLUCOSEU 500 (?) 11/06/2009 0948   HGBUR NEGATIVE 05/15/2013 0007   BILIRUBINUR NEGATIVE 05/15/2013 0007   KETONESUR NEGATIVE 05/15/2013 0007   PROTEINUR >300 (A) 05/15/2013 0007   UROBILINOGEN 0.2 05/15/2013 0007   NITRITE NEGATIVE 05/15/2013 0007   LEUKOCYTESUR NEGATIVE 05/15/2013 0007   Sepsis Labs: @LABRCNTIP (procalcitonin:4,lacticidven:4)  ) Recent Results (from the past 240 hour(s))  Blood Culture (routine x 2)     Status: Abnormal   Collection Time: 12/30/17 12:54 PM  Result Value Ref Range Status   Specimen Description BLOOD SITE NOT SPECIFIED  Final   Special Requests   Final    BOTTLES DRAWN AEROBIC AND ANAEROBIC Blood Culture adequate volume   Culture  Setup Time   Final    GRAM POSITIVE COCCI IN CLUSTERS AEROBIC BOTTLE ONLY CRITICAL RESULT CALLED TO, READ BACK BY AND VERIFIED WITH: PHARMD E MARTIN [id number] MLM    Culture (A)  Final    STAPHYLOCOCCUS SPECIES (COAGULASE NEGATIVE) THE SIGNIFICANCE OF ISOLATING THIS ORGANISM FROM A  SINGLE SET OF BLOOD CULTURES WHEN MULTIPLE SETS ARE DRAWN IS UNCERTAIN. PLEASE NOTIFY THE MICROBIOLOGY DEPARTMENT WITHIN ONE WEEK IF SPECIATION AND SENSITIVITIES ARE REQUIRED. Performed at [facility], [place] [address], [place], [place] [address]    Report Status 01/02/2018 FINAL  Final  Blood Culture ID Panel (Reflexed)     Status: Abnormal   Collection Time: 12/30/17 12:54 PM  Result Value Ref Range Status   Enterococcus species NOT DETECTED NOT DETECTED Final   Listeria monocytogenes NOT DETECTED NOT DETECTED Final   Staphylococcus species DETECTED (A) NOT DETECTED Final    Comment: Methicillin (oxacillin) resistant coagulase negative staphylococcus. Possible blood culture contaminant (unless isolated from more than one blood culture draw or clinical case suggests pathogenicity). No antibiotic treatment is indicated for blood  culture contaminants. CRITICAL RESULT CALLED TO, READ BACK BY AND VERIFIED WITH: PHARMD E MARTIN [id number] MLM    Staphylococcus aureus NOT DETECTED NOT DETECTED Final   Methicillin resistance DETECTED (A) NOT DETECTED Final    Comment: CRITICAL RESULT CALLED TO, READ BACK BY AND VERIFIED WITH: PHARMD E MARTIN [id number] MLM    Streptococcus species NOT DETECTED NOT DETECTED Final   Streptococcus agalactiae NOT DETECTED NOT DETECTED Final   Streptococcus pneumoniae NOT DETECTED NOT DETECTED Final   Streptococcus pyogenes NOT DETECTED NOT DETECTED Final   Acinetobacter baumannii NOT DETECTED NOT DETECTED Final   Enterobacteriaceae species NOT DETECTED NOT DETECTED Final   Enterobacter cloacae complex NOT DETECTED NOT DETECTED Final   Escherichia coli NOT DETECTED NOT DETECTED Final   Klebsiella oxytoca NOT DETECTED NOT DETECTED Final   Klebsiella pneumoniae  NOT DETECTED NOT DETECTED Final   Proteus species NOT DETECTED NOT DETECTED Final   Serratia marcescens NOT DETECTED NOT DETECTED Final   Haemophilus influenzae NOT DETECTED NOT DETECTED Final    Neisseria meningitidis NOT DETECTED NOT DETECTED Final   Pseudomonas aeruginosa NOT DETECTED NOT DETECTED Final   Candida albicans NOT DETECTED NOT DETECTED Final   Candida glabrata NOT DETECTED NOT DETECTED Final   Candida krusei NOT DETECTED NOT DETECTED Final   Candida parapsilosis NOT DETECTED NOT DETECTED Final   Candida tropicalis NOT DETECTED NOT DETECTED Final    Comment: Performed at [facility], [place] [address], [place], [place] [address]  Blood Culture (routine x 2)     Status: None   Collection Time: 12/30/17 12:55 PM  Result Value Ref Range Status   Specimen Description BLOOD RIGHT ANTECUBITAL  Final   Special Requests   Final    BOTTLES DRAWN AEROBIC AND ANAEROBIC Blood Culture adequate volume   Culture   Final    NO GROWTH 5 DAYS Performed at [facility], [place] [address] [place], [place] [address]    Report Status 01/04/2018 FINAL  Final  MRSA PCR Screening     Status: None   Collection Time: 12/30/17  3:16 PM  Result Value Ref Range Status   MRSA by PCR NEGATIVE NEGATIVE Final    Comment:        The GeneXpert MRSA Assay (FDA approved for NASAL specimens only), is one component of a comprehensive MRSA colonization surveillance program. It is not intended to diagnose MRSA infection nor to guide or monitor treatment for MRSA infections. Performed at [facility], [place] [address] [place], [place] [address]   C difficile quick scan w PCR reflex     Status: None   Collection Time: 01/01/18  8:09 AM  Result Value Ref Range Status   C Diff antigen NEGATIVE NEGATIVE Final   C Diff toxin NEGATIVE NEGATIVE Final   C Diff interpretation No C. difficile detected.  Final  Culture, blood (routine x 2)     Status: None (Preliminary result)   Collection Time: 01/02/18  8:43 AM  Result Value Ref Range Status   Specimen Description BLOOD RIGHT ANTECUBITAL  Final   Special Requests   Final    BOTTLES DRAWN AEROBIC AND ANAEROBIC Blood Culture adequate  volume   Culture   Final    NO GROWTH 3 DAYS Performed at [facility], [address], [place], [place] [address]    Report Status PENDING  Incomplete  Culture, blood (routine x 2)     Status: None (Preliminary result)   Collection Time: 01/02/18  8:53 AM  Result Value Ref Range Status   Specimen Description BLOOD RIGHT ANTECUBITAL  Final   Special Requests   Final    BOTTLES DRAWN AEROBIC AND ANAEROBIC Blood Culture adequate volume   Culture   Final    NO GROWTH 3 DAYS Performed at [facility], [place] [address] [place], [place] [address]    Report Status PENDING  Incomplete  Culture, blood (routine x 2)     Status: None (Preliminary result)   Collection Time: 01/03/18  1:52 PM  Result Value Ref Range Status   Specimen Description BLOOD RIGHT HAND  Final   Special Requests   Final    BOTTLES DRAWN AEROBIC ONLY Blood Culture adequate volume   Culture   Final    NO GROWTH 2 DAYS Performed  at [facility], [place] [address], [place], [place] [address]    Report Status PENDING  Incomplete  Culture, blood (routine x 2)     Status: None (Preliminary result)   Collection Time: 01/03/18  1:52 PM  Result Value Ref Range Status   Specimen Description BLOOD RIGHT HAND  Final   Special Requests   Final    BOTTLES DRAWN AEROBIC ONLY Blood Culture results may not be optimal due to an inadequate volume of blood received in culture bottles   Culture   Final    NO GROWTH 2 DAYS Performed at [facility], [place] [address], [place], [place] [address]    Report Status PENDING  Incomplete  Surgical PCR screen     Status: None   Collection Time: 01/05/18  8:30 PM  Result Value Ref Range Status   MRSA, PCR NEGATIVE NEGATIVE Final   Staphylococcus aureus NEGATIVE NEGATIVE Final    Comment: (NOTE) The Xpert SA Assay (FDA approved for NASAL specimens in patients 36 years of age and older), is one component of a comprehensive surveillance program. It is not intended to  diagnose infection nor to guide or monitor treatment. Performed at [facility], [place] [address], [place],  [address]          Radiology Studies: Dg Tibia/fibula Left  Result Date: 01/05/2018 CLINICAL DATA:  Pain at stump of LEFT BKA, distal wound, fever EXAM: LEFT TIBIA AND FIBULA - 2 VIEW COMPARISON:  08/24/2016 FINDINGS: Diffuse osseous demineralization. Degenerative changes at the LEFT knee joint with joint space narrowing and spur formation. Stumps of the LEFT tibia and fibula appear intact and unchanged from the previous exam. No definite acute bone destruction identified to suggest osteomyelitis. Extensive atherosclerotic calcifications. IMPRESSION: Post LEFT BKA. No definite acute stump abnormalities identified. Electronically Signed   By: Lavonia Dana M.D.   On: 01/05/2018 12:51        Scheduled Meds: . amiodarone  200 mg Per Tube Daily  . atorvastatin  40 mg Per Tube QHS  . Chlorhexidine Gluconate Cloth  6 each Topical Q0600  . Chlorhexidine Gluconate Cloth  6 each Topical Q0600  . [START ON 01/11/2018] darbepoetin (ARANESP) injection - DIALYSIS  60 mcg Intravenous Q Tue-HD  . escitalopram  10 mg Per Tube Daily  . famotidine  20 mg Oral QHS  . feeding supplement (NEPRO CARB STEADY)  1,000 mL Per Tube Q24H  . feeding supplement (PRO-STAT SUGAR FREE 64)  30 mL Per Tube TID  . insulin aspart  0-9 Units Subcutaneous Q4H  . latanoprost  1 drop Both Eyes QHS  . mouth rinse  15 mL Mouth Rinse BID  . metoprolol tartrate  12.5 mg Per Tube BID   Continuous Infusions: . sodium chloride Stopped (01/02/18 1800)  . sodium chloride 10 mL/hr at 01/05/18 0907  . vancomycin       LOS: 7 days    Time spent: 40 minutes    Tadeusz Stahl, Geraldo Docker, MD [facility] Pager [phone]   If 7PM-7AM, please contact night-coverage [email or web address] Password [facility] 01/06/2018, 9:44 AM

## 2018-01-06 NOTE — Progress Notes (Signed)
ANTIBIOTIC CONSULT NOTE   Pharmacy Consult for Vancomycin Indication: CNS bacteremia  Allergies  Allergen Reactions  . Ceftriaxone Sodium In Dextrose Anaphylaxis    Tolerates Zosyn  . Ciprofloxacin Other (See Comments)    UNSPECIFIED PAIN  . Nsaids Other (See Comments)    HISTORY OF ULCER  . Penicillins Swelling    Tolerates Zosyn [ PATIENT WITH HX OF ANAPHYLAXIS TO CEFTRIAXONE ] 11/26/17:  pt has received 3 doses of zosyn in past (04/2017.) No allergy tor adverse event to zosyn noted at that time.     . Sulfa Antibiotics Swelling    SWELLING REACTION UNSPECIFIED     Patient Measurements: Height: 5\' 10"  (177.8 cm) Weight: 201 lb 8 oz (91.4 kg) IBW/kg (Calculated) : 73 Adjusted Body Weight:    Vital Signs: Temp: 98.3 F (36.8 C) (06/13 0800) Temp Source: Axillary (06/13 0800) BP: 145/89 (06/13 0800) Pulse Rate: 85 (06/13 0800) Intake/Output from previous day: 06/12 0701 - 06/13 0700 In: 929 [NG/GT:428.7; IV Piggyback:470.3] Out: 1 [Urine:1] Intake/Output from this shift: No intake/output data recorded.  Labs: Recent Labs    01/04/18 0717 01/05/18 0820 01/06/18 0636  WBC 9.0 11.1* 10.1  HGB 8.7* 8.8* 9.2*  PLT 374 422* 508*  CREATININE  --  6.15* 7.66*   Estimated Creatinine Clearance: 10.8 mL/min (A) (by C-G formula based on SCr of 7.66 mg/dL (H)). No results for input(s): VANCOTROUGH, VANCOPEAK, VANCORANDOM, GENTTROUGH, GENTPEAK, GENTRANDOM, TOBRATROUGH, TOBRAPEAK, TOBRARND, AMIKACINPEAK, AMIKACINTROU, AMIKACIN in the last 72 hours.   Microbiology:   Medical History: Past Medical History:  Diagnosis Date  . Anemia   . Antral ulcer 2014   small  . BENIGN PROSTATIC HYPERTROPHY   . Bipolar disorder ([place])    "sometimes" (10/07/2016)  . CHOLELITHIASIS   . Chronic combined systolic and diastolic CHF (congestive heart failure) ([place])   . Complication of anesthesia    wife states pt had trouble waking up with in Nov., 2014  . CVA (cerebral vascular accident)  ([place]) 07/2007   No residual effect  . DEPRESSION   . DIABETES MELLITUS, TYPE II    diet control.  No medication  since November 2015  . ERECTILE DYSFUNCTION   . ESRD on hemodialysis ([place])    ESRD due to DM/HTN. .  HD TTS at [facility] on [address].  [name] GERD   . GI bleed    due to gastritis, discharged 10/02/16/notes 10/07/2016  . Hepatitis C    C - has been treated  . History of Clostridium difficile   . History of kidney stones   . HYPERTENSION   . LBBB (left bundle branch block)   . Morbid obesity ([place])   . PAF (paroxysmal atrial fibrillation) ([place])    a. Dx 12/2015.   Assessment:  ID: Vanco for CNS bacteremia, WBC 10.1 , LA 2.6, PCT 2.97 (6/13), Tmax 100.2 Removed HD cath 6/10, plan line holiday and replace temp catheter when INR<1.8. Per Dr. Candiss Norse, plan 7d of Vancomycin after line pulled (stop 6/17).  - Need to xray left BKA - marked tenderness/distal wound and also scabbed area- very tender - ordered xray- could explain persistent fevers.  6/6 Vanco >> (6/17) - extra 1/2 dose 500mg  Vanco 6/11 due to fever per MD 6/6 Zosyn >> 6/10  6/6 BCx: 1/2 coag neg staph 6/9 BCx: NGTD 6/10 BCx: NGTD 6/8: C Diff antigen/toxin negative MRSA PCR negative  Goal of Therapy:  Vancomycin level <15 prior to re-dosing post-HD.  Plan:  - Vancomycin  1000mg  IV qHD TTS (last dose Mon 6/10, next 6/13? or when line replaced/HD).  - Heparin off until [facility]. Alford Highland, PharmD, BCPS Clinical Staff Pharmacist Pager [phone]  Eilene Ghazi Stillinger 01/06/2018,8:34 AM

## 2018-01-06 NOTE — Telephone Encounter (Signed)
Received call from Dr. Larita Fife wanting to let Dr. Sharol Given know that pt is very tender around stump area and feverish and they are questioning if he has leg infection. They are getting a rush MRI today and will call back once that has been done with results. Pt is in 5W21.

## 2018-01-06 NOTE — Progress Notes (Signed)
   inr remains elevated. Will cancel tdc today and if needs urgent hd will have to have temp catheter. Will proceed tomorrow if inr corrected.   Cypher Paule C. Donzetta Matters, MD [facility] Office: [phone] Pager: [phone]

## 2018-01-06 NOTE — Progress Notes (Signed)
[facility] Progress Note  Dialysis Orders: [facility]  4h 88kg 2/2 bath p4 L TDC heparin 4000 then 2000 midrun -venofer 100 mg x 10 to start 6/8  Assessment/Plan: 1Fevers/ sepsis - no clear etiology.  Had 1/4 + bcx's for mrse and HD pulled for possible infection. Also consider skin changes/ pain/ tenderness of L stump/ mid leg.  Have d/ w primary will get MRI today.   2 Confusion - more interactive today 3 Esrd - usual HD TTS. Plan HD tomorrow after cath replacement.  4 Hypotension/volume - bp's are usually normal at OP HD. Midodrine addedhere. BP's are labile.  5 Anemia ckd - Hgb trending down8.8 now stable- significant decline since admission.  60 Aranesp given 6/10. Holding IV Fe here for now. repeating FOB. Hb 9's today.  6 mbd ckd -  Phos got down to 1.0,  stopped binders and loaded, up to 2.4 today.  7 Afib - on anticoag with + hemocult. INR up today, will give po vit K 5 mg to correct for HD cath procedure tomorrow.  8 DM2 9 FTT - failure to thrive 10. Nutrition - nepro at 40 per PEG/prostat alb 2.1  11. Stage 2 Decub - stooling with rectal tube   Kelly Splinter MD [facility] pager [phone]   01/06/2018, 11:40 AM    Subjective:   Not anwering questions  Objective Vitals:   01/06/18 0419 01/06/18 0424 01/06/18 0519 01/06/18 0800  BP:  (!) 173/87 (!) 162/83 (!) 145/89  Pulse:  81  85  Resp:  (!) 24 18 17   Temp: 98.8 F (37.1 C)   98.3 F (36.8 C)  TempSrc: Axillary   Axillary  SpO2:  97%  99%  Weight:   91.4 kg (201 lb 8 oz)   Height:       Physical Exam General: NAD supine in room generalized puffiness, eyes barely open to questioning, mumbles Heart: RRR Lungs: grossly clear Abdomen: soft Extremities: bilateral BKA left distal stump scabbed and also open wound - very tender to touch  Dialysis Access:  none   Additional Objective Labs: Basic Metabolic Panel: Recent Labs  Lab 01/03/18 0720  01/05/18 0820 01/06/18 0636  NA 136 138 137  K 3.3* 3.9 4.2  CL 97* 99* 99*  CO2 28 26 26   GLUCOSE 191* 135* 152*  BUN 66* 51* 69*  CREATININE 6.92* 6.15* 7.66*  CALCIUM 8.7* 9.0 8.9  PHOS 1.0* 1.0* 2.4*   Liver Function Tests: Recent Labs  Lab 12/30/17 1242 01/03/18 0720 01/05/18 0820  AST 19  --   --   ALT 14*  --   --   ALKPHOS 54  --   --   BILITOT 0.6  --   --   PROT 8.4*  --   --   ALBUMIN 3.0* 2.1* 2.1*   No results for input(s): LIPASE, AMYLASE in the last 168 hours. CBC: Recent Labs  Lab 12/30/17 1242  01/02/18 0404 01/03/18 0513 01/04/18 0717 01/05/18 0820 01/06/18 0636  WBC 10.4   < > 9.6 9.3 9.0 11.1* 10.1  NEUTROABS 8.5*  --   --   --   --   --   --   HGB 11.4*   < > 9.3* 8.8* 8.7* 8.8* 9.2*  HCT 35.9*   < > 29.0* 27.7* 27.7* 28.1* 28.4*  MCV 88.6   < > 86.3 87.9 87.9 88.4 85.8  PLT 317   < > 318 343 374 422* 508*   < > =  values in this interval not displayed.   Blood Culture    Component Value Date/Time   SDES BLOOD RIGHT HAND 01/03/2018 1352   SDES BLOOD RIGHT HAND 01/03/2018 1352   SPECREQUEST  01/03/2018 1352    BOTTLES DRAWN AEROBIC ONLY Blood Culture adequate volume   SPECREQUEST  01/03/2018 1352    BOTTLES DRAWN AEROBIC ONLY Blood Culture results may not be optimal due to an inadequate volume of blood received in culture bottles   CULT  01/03/2018 1352    NO GROWTH 2 DAYS Performed at [facility], [place] [address], [place], [place] [address]    CULT  01/03/2018 1352    NO GROWTH 2 DAYS Performed at [facility], [place] [address] [place] [address], [place], [place] [address]    REPTSTATUS PENDING 01/03/2018 1352   REPTSTATUS PENDING 01/03/2018 1352    Cardiac Enzymes: Recent Labs  Lab 12/30/17 1449 12/30/17 1815 12/31/17 0213  TROPONINI 0.05* 0.05* 0.06*   CBG: Recent Labs  Lab 01/05/18 1626 01/05/18 2012 01/05/18 2350 01/06/18 0417 01/06/18 0802  GLUCAP 174* 194* 165* 155* 142*   Iron Studies: No results for input(s): IRON,  TIBC, TRANSFERRIN, FERRITIN in the last 72 hours. Lab Results  Component Value Date   INR 2.37 01/06/2018   INR 2.23 01/05/2018   INR 2.45 01/04/2018   Studies/Results: Dg Tibia/fibula Left  Result Date: 01/05/2018 CLINICAL DATA:  Pain at stump of LEFT BKA, distal wound, fever EXAM: LEFT TIBIA AND FIBULA - 2 VIEW COMPARISON:  08/24/2016 FINDINGS: Diffuse osseous demineralization. Degenerative changes at the LEFT knee joint with joint space narrowing and spur formation. Stumps of the LEFT tibia and fibula appear intact and unchanged from the previous exam. No definite acute bone destruction identified to suggest osteomyelitis. Extensive atherosclerotic calcifications. IMPRESSION: Post LEFT BKA. No definite acute stump abnormalities identified. Electronically Signed   By: Lavonia Dana M.D.   On: 01/05/2018 12:51   Medications: . sodium chloride Stopped (01/02/18 1800)  . sodium chloride 10 mL/hr at 01/05/18 0907  . vancomycin     . amiodarone  200 mg Per Tube Daily  . atorvastatin  40 mg Per Tube QHS  . [START ON 01/11/2018] darbepoetin (ARANESP) injection - DIALYSIS  60 mcg Intravenous Q Tue-HD  . escitalopram  10 mg Per Tube Daily  . famotidine  20 mg Oral QHS  . feeding supplement (NEPRO CARB STEADY)  1,000 mL Per Tube Q24H  . feeding supplement (PRO-STAT SUGAR FREE 64)  30 mL Per Tube TID  . insulin aspart  0-9 Units Subcutaneous Q4H  . latanoprost  1 drop Both Eyes QHS  . mouth rinse  15 mL Mouth Rinse BID  . metoprolol tartrate  12.5 mg Per Tube BID  . phytonadione  5 mg Oral Once

## 2018-01-06 NOTE — Progress Notes (Signed)
Daily Progress Note   Patient Name: Oscar Castillo       Date: 01/06/2018 DOB: 1951-07-09  Age: 67 y.o. MRN#: [id number] Attending Physician: Allie Bossier, MD Primary Care Physician: [name], MD Admit Date: 12/30/2017  Reason for Consultation/Follow-up: Support  Subjective: Patient is resting in bed. No family at bedside. Called to speak with wife. She states that this has been a lot, with one thing after another discussing the inability to place a cath for dialysis today. She states she is waiting for this MRI to see if he has an infection. She is hopeful they can find a source of infection so that it can be corrected and he will improve.   Length of Stay: 7  Current Medications: Scheduled Meds:  . amiodarone  200 mg Per Tube Daily  . atorvastatin  40 mg Per Tube QHS  . [START ON 01/11/2018] darbepoetin (ARANESP) injection - DIALYSIS  60 mcg Intravenous Q Tue-HD  . escitalopram  10 mg Per Tube Daily  . famotidine  20 mg Oral QHS  . feeding supplement (NEPRO CARB STEADY)  1,000 mL Per Tube Q24H  . feeding supplement (PRO-STAT SUGAR FREE 64)  30 mL Per Tube TID  . insulin aspart  0-9 Units Subcutaneous Q4H  . latanoprost  1 drop Both Eyes QHS  . mouth rinse  15 mL Mouth Rinse BID  . metoprolol tartrate  12.5 mg Per Tube BID    Continuous Infusions: . sodium chloride Stopped (01/02/18 1800)  . sodium chloride 10 mL/hr at 01/05/18 0907  . vancomycin 1,000 mg (01/06/18 1335)    PRN Meds: sodium chloride, acetaminophen  Physical Exam  Constitutional:  Eyes closed.  Pulmonary/Chest:  Slight tachypnea noted.  Skin: Skin is warm.            Vital Signs: BP (!) 154/85 (BP Location: Right Arm)   Pulse 78   Temp 99.3 F (37.4 C) (Axillary)   Resp 17   Ht 5\' 10"  (1.778 m)    Wt 91.4 kg (201 lb 8 oz)   SpO2 100%   BMI 28.91 kg/m  SpO2: SpO2: 100 % O2 Device: O2 Device: Room Air O2 Flow Rate: O2 Flow Rate (L/min): 2 L/min  Intake/output summary:   Intake/Output Summary (Last 24 hours) at 01/06/2018 1338 Last data  filed at 01/06/2018 1000 Gross per 24 hour  Intake 899 ml  Output 1 ml  Net 898 ml   LBM: Last BM Date: 01/06/18 Baseline Weight: Weight: 87.5 kg (193 lb) Most recent weight: Weight: 91.4 kg (201 lb 8 oz)       Palliative Assessment/Data: NPO.      Patient Active Problem List   Diagnosis Date Noted  . Pressure injury of skin 11/27/2017  . Toxic metabolic encephalopathy 90/24/0973  . Pulmonary edema   . Septic shock ([place]) 11/22/2017  . Severe sepsis ([place]) 11/21/2017  . Chronic pain 09/07/2017  . CHB (complete heart block) ([place]) 04/29/2017  . Heart block AV third degree ([place]) 04/29/2017  . PAD (peripheral artery disease) ([place])   . Gait disorder 02/10/2017  . Anemia due to stage 5 chronic kidney disease ([place])   . Benign neoplasm of descending colon   . Duodenal ulcer without hemorrhage or perforation   . Paroxysmal atrial fibrillation ([place]) 09/15/2016  . Glaucoma 12/27/2015  . LBBB (left bundle branch block)   . Liver fibrosis 10/07/2015  . Cigarette nicotine dependence without complication 53/29/9242  . Obesity (BMI 30-39.9) 06/06/2015  . Organ transplant candidate 05/17/2015  . DM (diabetes mellitus) with peripheral vascular complication ([place]) 68/34/1962  . Insulin-dependent diabetes mellitus with proliferative retinopathy ([place]) 05/17/2015  . History of alcoholism ([place]) 05/10/2015  . Ulcer of sacral region, stage 3 ([place]) 09/26/2013  . S/P BKA (below knee amputation) bilateral ([place]) 09/08/2013  . ESRD on hemodialysis ([place]) 05/09/2013  . Cerebrovascular accident ([place]) 02/20/2013  . Acute on chronic combined systolic and diastolic CHF (congestive heart failure) ([place]) 10/28/2012  . GI bleed 10/27/2012  . Mass in rectum  10/27/2012  . Type 2 diabetes mellitus with chronic kidney disease on chronic dialysis, with long-term current use of insulin ([place]) 09/08/2012  . Essential (primary) hypertension 09/08/2012  . LVH (left ventricular hypertrophy)-severe concentric 06/13/2012  . Hyperlipidemia, unspecified 01/30/2011  . CHOLELITHIASIS 08/01/2010  . BENIGN PROSTATIC HYPERTROPHY 08/01/2010  . History of cardiovascular disorder 08/06/2009  . Depression 03/18/2009  . NEPHROLITHIASIS, HX OF 03/18/2009  . Major depressive disorder, single episode, unspecified 03/18/2009  . Morbid obesity ([place]) 03/25/2007  . Hypertensive heart disease with CHF (congestive heart failure) ([place]) 03/25/2007  . GERD (gastroesophageal reflux disease) 03/25/2007  . Chronic hepatitis C without hepatic coma ([place]) 03/25/2007    Palliative Care Assessment & Plan   Patient Profile:  Oscar Castillo is a 67 y.o. male with ESRD on HD (TTS [place]) DM Type 2, HTN, Hx CVA, paroxsymal Afib on chronic Coumadin, Hep C, Hx complete heart block s/p PPM, bilat BKA.  He was admitted to [facility] admission last month with AMS. He was treated for septic shock. He was discharged from [facility] to a facility.   Presented to ED yesterday with fevers and hypotension concerning for sepsis. Looking for cause of sepsis.    Assessment/Recommendations/Plan:  Wife is hopeful to find source of infection with treatment and improvement.  She is recognizing there is a lot going one.   Will continue to follow peripherally. Please call for immediate needs.   Code Status:    Code Status Orders  (From admission, onward)        Start     Ordered   12/30/17 1412  Full code  Continuous     12/30/17 1416    Code Status History    Date Active Date Inactive Code Status Order ID Comments User Context  11/22/2017 0258 12/02/2017 2100 Full Code [id number]  Reyne Dumas, MD ED   11/21/2017 2242 11/22/2017 0257 Full Code [id number]  Vilma Prader, MD ED   05/07/2017 0005 05/09/2017 2021 Full Code [id number]  Norval Morton, MD ED   04/29/2017 2006 05/02/2017 1945 Full Code [id number]  Lorretta Harp, MD Inpatient   04/29/2017 2006 04/29/2017 2006 Full Code [id number]  Conrad Starks, MD Inpatient   12/14/2016 1105 12/14/2016 1450 Full Code [id number]  Conrad Menahga, MD Inpatient   11/08/2016 0526 11/14/2016 1834 Full Code [id number]  Edwin Dada, MD Inpatient   10/07/2016 1247 10/09/2016 1758 Full Code [id number]  Johnney Ou Inpatient   09/26/2016 1310 10/02/2016 2057 Full Code [id number]  Elwin Mocha, MD ED   09/15/2016 1525 09/16/2016 2143 Full Code [id number]  Orson Eva, MD Inpatient   12/27/2015 0131 12/31/2015 1716 Full Code [id number]  Reubin [name], MD Inpatient   10/25/2013 1006 10/31/2013 2122 Full Code [id number]  Marybelle Killings, MD Inpatient   10/11/2013 1206 10/25/2013 1006 Full Code [id number]  Newt Minion, MD Inpatient   10/01/2013 1633 10/11/2013 1206 Full Code [id number]  Nita Sells, MD Inpatient   09/08/2013 1646 09/11/2013 1851 Full Code [id number]  Newt Minion, MD Inpatient   06/09/2013 1537 06/12/2013 1720 Full Code [id number]  Newt Minion, MD Inpatient   05/12/2013 1741 05/15/2013 2209 Full Code [id number]  Newt Minion, MD Inpatient   05/09/2013 0111 05/12/2013 1741 Full Code [id number]  Rise Patience, MD Inpatient   11/01/2012 1441 11/13/2012 1551 Full Code [id number]  Velvet Bathe, MD Inpatient   10/28/2012 1506 10/30/2012 1655 Full Code [id number]  Elmarie Shiley, MD Inpatient   09/12/2012 1641 09/24/2012 0850 Full Code [id number]  Cathlyn Parsons, PA Inpatient   09/08/2012 0225 09/12/2012 1641 Full Code [id number]  Toy Baker, MD Inpatient       Prognosis:  Very poor. < 6 months.  Discharge Planning:  To Be Determined  Care plan was discussed with RN  Thank you for allowing the Palliative Medicine Team to assist in the care of this patient.   Total Time 25 min Prolonged  Time Billed No       Greater than 50%  of this time was spent counseling and coordinating care related to the above assessment and plan.  Asencion Gowda, NP  Please contact Palliative Medicine Team phone at [phone] for questions and concerns.

## 2018-01-06 NOTE — NC FL2 (Signed)
[place] LEVEL OF CARE SCREENING TOOL     IDENTIFICATION  Patient Name: Oscar Castillo Birthdate: [birth year] Sex: male Admission Date (Current Location): 12/30/2017  [facility] and [place] Number:  Herbalist and Address:  [facility], [place] [address] [place], [place] [address]      Provider Number: [id number]  Attending Physician Name and Address:  Allie Bossier, MD  Relative Name and Phone Number:  Percival Spanish, spouse, [phone]    Current Level of Care: Hospital Recommended Level of Care: [place] Prior Approval Number:    Date Approved/Denied:   PASRR Number: [id number]  Discharge Plan: SNF    Current Diagnoses: Patient Active Problem List   Diagnosis Date Noted  . Pressure injury of skin 11/27/2017  . Toxic metabolic encephalopathy 76/73/4193  . Pulmonary edema   . Septic shock ([place]) 11/22/2017  . Severe sepsis ([place]) 11/21/2017  . Chronic pain 09/07/2017  . CHB (complete heart block) ([place]) 04/29/2017  . Heart block AV third degree ([place]) 04/29/2017  . PAD (peripheral artery disease) ([place])   . Gait disorder 02/10/2017  . Anemia due to stage 5 chronic kidney disease ([place])   . Benign neoplasm of descending colon   . Duodenal ulcer without hemorrhage or perforation   . Paroxysmal atrial fibrillation ([place]) 09/15/2016  . Glaucoma 12/27/2015  . LBBB (left bundle branch block)   . Liver fibrosis 10/07/2015  . Cigarette nicotine dependence without complication 79/08/4095  . Obesity (BMI 30-39.9) 06/06/2015  . Organ transplant candidate 05/17/2015  . DM (diabetes mellitus) with peripheral vascular complication ([place]) 35/32/9924  . Insulin-dependent diabetes mellitus with proliferative retinopathy ([place]) 05/17/2015  . History of alcoholism ([place]) 05/10/2015  . Ulcer of sacral region, stage 3 ([place]) 09/26/2013  . S/P BKA (below knee amputation) bilateral ([place]) 09/08/2013  . ESRD on hemodialysis ([place]) 05/09/2013   . Cerebrovascular accident ([place]) 02/20/2013  . Acute on chronic combined systolic and diastolic CHF (congestive heart failure) ([place]) 10/28/2012  . GI bleed 10/27/2012  . Mass in rectum 10/27/2012  . Type 2 diabetes mellitus with chronic kidney disease on chronic dialysis, with long-term current use of insulin ([place]) 09/08/2012  . Essential (primary) hypertension 09/08/2012  . LVH (left ventricular hypertrophy)-severe concentric 06/13/2012  . Hyperlipidemia, unspecified 01/30/2011  . CHOLELITHIASIS 08/01/2010  . BENIGN PROSTATIC HYPERTROPHY 08/01/2010  . History of cardiovascular disorder 08/06/2009  . Depression 03/18/2009  . NEPHROLITHIASIS, HX OF 03/18/2009  . Major depressive disorder, single episode, unspecified 03/18/2009  . Morbid obesity ([place]) 03/25/2007  . Hypertensive heart disease with CHF (congestive heart failure) ([place]) 03/25/2007  . GERD (gastroesophageal reflux disease) 03/25/2007  . Chronic hepatitis C without hepatic coma ([place]) 03/25/2007    Orientation RESPIRATION BLADDER Height & Weight     Self  Normal Continent Weight: 91.4 kg (201 lb 8 oz) Height:  5\' 10"  (177.8 cm)  BEHAVIORAL SYMPTOMS/MOOD NEUROLOGICAL BOWEL NUTRITION STATUS      Incontinent(Rectal tube) Feeding tube  AMBULATORY STATUS COMMUNICATION OF NEEDS Skin   Extensive Assist Verbally PU Stage and Appropriate Care(Unstageable on leg; Stage II on rectum and buttocks;non pressure wound on knee)                       Personal Care Assistance Level of Assistance  Bathing, Feeding, Dressing Bathing Assistance: Maximum assistance Feeding assistance: Maximum assistance Dressing Assistance: Maximum assistance     Functional Limitations Info  Sight, Hearing, Speech Sight Info: Impaired  Hearing Info: Adequate Speech Info: Adequate    SPECIAL CARE FACTORS FREQUENCY                       Contractures      Additional Factors Info  Code Status, Allergies, Insulin Sliding Scale Code  Status Info: Full Allergies Info: Ceftriaxone Sodium In Dextrose, Ciprofloxacin, Nsaids, Penicillins, Sulfa Antibiotics   Insulin Sliding Scale Info: Every 4 hours       Current Medications (01/06/2018):  This is the current hospital active medication list Current Facility-Administered Medications  Medication Dose Route Frequency Provider Last Rate Last Dose  . 0.9 %  sodium chloride infusion  250 mL Intravenous PRN Collene Gobble, MD   Stopped at 01/02/18 1800  . 0.9 %  sodium chloride infusion   Intravenous Continuous Ellender, Karyl Kinnier, MD 10 mL/hr at 01/05/18 0907    . acetaminophen (TYLENOL) tablet 650 mg  650 mg Per Tube Q6H PRN Thurnell Lose, MD   650 mg at 01/05/18 2019  . amiodarone (PACERONE) tablet 200 mg  200 mg Per Tube Daily Collene Gobble, MD   200 mg at 01/06/18 0908  . atorvastatin (LIPITOR) tablet 40 mg  40 mg Per Tube QHS Collene Gobble, MD   40 mg at 01/05/18 2311  . [START ON 01/11/2018] Darbepoetin Alfa (ARANESP) injection 60 mcg  60 mcg Intravenous Q Tue-HD Hammons, Kimberly B, RPH      . escitalopram (LEXAPRO) tablet 10 mg  10 mg Per Tube Daily Collene Gobble, MD   10 mg at 01/06/18 0908  . famotidine (PEPCID) tablet 20 mg  20 mg Oral QHS Collene Gobble, MD   20 mg at 01/05/18 2311  . feeding supplement (NEPRO CARB STEADY) liquid 1,000 mL  1,000 mL Per Tube Q24H Collene Gobble, MD   Stopped at 01/06/18 0012  . feeding supplement (PRO-STAT SUGAR FREE 64) liquid 30 mL  30 mL Per Tube TID Collene Gobble, MD   30 mL at 01/06/18 0908  . insulin aspart (novoLOG) injection 0-9 Units  0-9 Units Subcutaneous Q4H Collene Gobble, MD   1 Units at 01/06/18 1334  . latanoprost (XALATAN) 0.005 % ophthalmic solution 1 drop  1 drop Both Eyes QHS Collene Gobble, MD   1 drop at 01/05/18 2312  . MEDLINE mouth rinse  15 mL Mouth Rinse BID Collene Gobble, MD   15 mL at 01/06/18 0907  . metoprolol tartrate (LOPRESSOR) 25 mg/10 mL oral suspension 12.5 mg  12.5 mg Per Tube BID Allie Bossier, MD   12.5 mg at 01/06/18 0908  . vancomycin (VANCOCIN) IVPB 1000 mg/200 mL premix  1,000 mg Intravenous Q T,Th,Sa-HD Hammons, Theone Murdoch, RPH   Stopped at 01/06/18 1435     Discharge Medications: Please see discharge summary for a list of discharge medications.  Relevant Imaging Results:  Relevant Lab Results:   Additional Information SSN: [place], [place]

## 2018-01-06 NOTE — Progress Notes (Signed)
Patients BP 173/87. On-call NP K. Schorr notified.  Will continue to monitor patient and await NP orders.

## 2018-01-07 ENCOUNTER — Inpatient Hospital Stay (HOSPITAL_COMMUNITY): Payer: Medicare Other

## 2018-01-07 ENCOUNTER — Inpatient Hospital Stay (HOSPITAL_COMMUNITY): Payer: Medicare Other | Admitting: Certified Registered"

## 2018-01-07 ENCOUNTER — Encounter (HOSPITAL_COMMUNITY)
Admission: EM | Disposition: A | Discharge status: Nursing Home (Includes ICF) | Payer: Self-pay | Source: Skilled Nursing Facility | Attending: Internal Medicine

## 2018-01-07 ENCOUNTER — Encounter (HOSPITAL_COMMUNITY): Payer: Self-pay | Admitting: Certified Registered"

## 2018-01-07 HISTORY — PX: INSERTION OF DIALYSIS CATHETER: SHX1324

## 2018-01-07 LAB — CULTURE, BLOOD (ROUTINE X 2)
CULTURE: NO GROWTH
Culture: NO GROWTH
SPECIAL REQUESTS: ADEQUATE
Special Requests: ADEQUATE

## 2018-01-07 LAB — GLUCOSE, CAPILLARY
GLUCOSE-CAPILLARY: 123 mg/dL — AB (ref 65–99)
GLUCOSE-CAPILLARY: 164 mg/dL — AB (ref 65–99)
Glucose-Capillary: 151 mg/dL — ABNORMAL HIGH (ref 65–99)
Glucose-Capillary: 133 mg/dL — ABNORMAL HIGH (ref 65–99)
Glucose-Capillary: 178 mg/dL — ABNORMAL HIGH (ref 65–99)
Glucose-Capillary: 173 mg/dL — ABNORMAL HIGH (ref 65–99)

## 2018-01-07 LAB — BASIC METABOLIC PANEL
Anion gap: 13 (ref 5–15)
BUN: 92 mg/dL — AB (ref 6–20)
CALCIUM: 9 mg/dL (ref 8.9–10.3)
CO2: 23 mmol/L (ref 22–32)
CREATININE: 9.15 mg/dL — AB (ref 0.61–1.24)
Chloride: 99 mmol/L — ABNORMAL LOW (ref 101–111)
GFR calc Af Amer: 6 mL/min — ABNORMAL LOW (ref >60)
GFR calc non Af Amer: 5 mL/min — ABNORMAL LOW (ref >60)
Glucose, Bld: 133 mg/dL — ABNORMAL HIGH (ref 65–99)
Potassium: 4.6 mmol/L (ref 3.5–5.1)
SODIUM: 135 mmol/L (ref 135–145)

## 2018-01-07 LAB — CBC
HCT: 28.9 % — ABNORMAL LOW (ref 39.0–52.0)
Hemoglobin: 9.2 g/dL — ABNORMAL LOW (ref 13.0–17.0)
MCH: 28 pg (ref 26.0–34.0)
MCHC: 31.8 g/dL (ref 30.0–36.0)
MCV: 87.8 fL (ref 78.0–100.0)
Platelets: 498 10*3/uL — ABNORMAL HIGH (ref 150–400)
RBC: 3.29 MIL/uL — ABNORMAL LOW (ref 4.22–5.81)
RDW: 16.5 % — AB (ref 11.5–15.5)
WBC: 10.3 10*3/uL (ref 4.0–10.5)

## 2018-01-07 LAB — PROTIME-INR
INR: 1.71
PROTHROMBIN TIME: 19.9 s — AB (ref 11.4–15.2)

## 2018-01-07 LAB — MAGNESIUM: Magnesium: 2.2 mg/dL (ref 1.7–2.4)

## 2018-01-07 SURGERY — INSERTION OF DIALYSIS CATHETER
Anesthesia: General | Site: Groin | Laterality: Right

## 2018-01-07 MED ORDER — HEPARIN SODIUM (PORCINE) 1000 UNIT/ML IJ SOLN
INTRAMUSCULAR | Status: DC | PRN
  Administered 2018-01-07: 1000 [IU]

## 2018-01-07 MED ORDER — HEPARIN (PORCINE) IN NACL 100-0.45 UNIT/ML-% IJ SOLN
1700.0000 [IU]/h | INTRAMUSCULAR | Status: DC
  Administered 2018-01-07: 1400 [IU]/h via INTRAVENOUS
  Administered 2018-01-08: 1600 [IU]/h via INTRAVENOUS
  Administered 2018-01-09 – 2018-01-10 (×4): 1700 [IU]/h via INTRAVENOUS
  Filled 2018-01-07 (×6): qty 250

## 2018-01-07 MED ORDER — CHLORHEXIDINE GLUCONATE CLOTH 2 % EX PADS: 6.0000 | MEDICATED_PAD | Freq: Every day | CUTANEOUS | Status: DC

## 2018-01-07 MED ORDER — SODIUM CHLORIDE 0.9 % IV SOLN
INTRAVENOUS | Status: DC | PRN
  Administered 2018-01-07: 13:00:00

## 2018-01-07 MED ORDER — LIDOCAINE-EPINEPHRINE 1 %-1:200000 IJ SOLN
INTRAMUSCULAR | Status: AC
Start: 2018-01-07 — End: ?
  Filled 2018-01-07: qty 30

## 2018-01-07 MED ORDER — DARBEPOETIN ALFA 150 MCG/0.3ML IJ SOSY
150.0000 ug | PREFILLED_SYRINGE | INTRAMUSCULAR | Status: DC
  Administered 2018-01-11: 150 ug via INTRAVENOUS
  Filled 2018-01-07: qty 0.3

## 2018-01-07 MED ORDER — VANCOMYCIN HCL IN DEXTROSE 1-5 GM/200ML-% IV SOLN
1000.0000 mg | Freq: Once | INTRAVENOUS | Status: AC
  Administered 2018-01-07: 1000 mg via INTRAVENOUS
  Filled 2018-01-07: qty 200

## 2018-01-07 MED ORDER — CHLORHEXIDINE GLUCONATE CLOTH 2 % EX PADS
6.0000 | MEDICATED_PAD | Freq: Every day | CUTANEOUS | Status: DC
  Administered 2018-01-07 – 2018-01-09 (×3): 6 via TOPICAL

## 2018-01-07 MED ORDER — SODIUM CHLORIDE 0.9 % IV SOLN
INTRAVENOUS | Status: DC
  Administered 2018-01-07: 13:00:00 via INTRAVENOUS

## 2018-01-07 MED ORDER — PROPOFOL 10 MG/ML IV BOLUS
INTRAVENOUS | Status: DC | PRN
  Administered 2018-01-07: 100 mg via INTRAVENOUS

## 2018-01-07 MED ORDER — HEPARIN SODIUM (PORCINE) 1000 UNIT/ML IJ SOLN
INTRAMUSCULAR | Status: AC
  Filled 2018-01-07: qty 1

## 2018-01-07 MED ORDER — PHENYLEPHRINE HCL 10 MG/ML IJ SOLN
INTRAVENOUS | Status: DC | PRN
  Administered 2018-01-07: 50 ug/min via INTRAVENOUS

## 2018-01-07 MED ORDER — 0.9 % SODIUM CHLORIDE (POUR BTL) OPTIME
TOPICAL | Status: DC | PRN
  Administered 2018-01-07: 1000 mL

## 2018-01-07 MED ORDER — HEPARIN (PORCINE) IN NACL 100-0.45 UNIT/ML-% IJ SOLN: 1400.0000 [IU]/h | INTRAMUSCULAR | Status: DC

## 2018-01-07 MED ORDER — SUCCINYLCHOLINE CHLORIDE 20 MG/ML IJ SOLN
INTRAMUSCULAR | Status: DC | PRN
  Administered 2018-01-07: 120 mg via INTRAVENOUS

## 2018-01-07 MED ORDER — RENA-VITE PO TABS
1.0000 | ORAL_TABLET | Freq: Every day | ORAL | Status: DC
  Administered 2018-01-07 – 2018-01-10 (×4): 1 via ORAL
  Filled 2018-01-07 (×4): qty 1

## 2018-01-07 MED ORDER — FENTANYL CITRATE (PF) 100 MCG/2ML IJ SOLN: 25.0000 ug | INTRAMUSCULAR | Status: DC | PRN

## 2018-01-07 MED ORDER — ONDANSETRON HCL 4 MG/2ML IJ SOLN: 4.0000 mg | Freq: Once | INTRAMUSCULAR | Status: DC | PRN

## 2018-01-07 MED ORDER — LIDOCAINE-EPINEPHRINE (PF) 1 %-1:200000 IJ SOLN
INTRAMUSCULAR | Status: DC | PRN
  Administered 2018-01-07: 30 mL

## 2018-01-07 MED ORDER — LIDOCAINE 2% (20 MG/ML) 5 ML SYRINGE
INTRAMUSCULAR | Status: DC | PRN
  Administered 2018-01-07: 40 mg via INTRAVENOUS

## 2018-01-07 MED ORDER — LIDOCAINE 2% (20 MG/ML) 5 ML SYRINGE
INTRAMUSCULAR | Status: AC
  Filled 2018-01-07: qty 5

## 2018-01-07 MED ORDER — LIDOCAINE HCL (PF) 1 % IJ SOLN
INTRAMUSCULAR | Status: AC
  Filled 2018-01-07: qty 30

## 2018-01-07 MED ORDER — HEPARIN SODIUM (PORCINE) 5000 UNIT/ML IJ SOLN
INTRAMUSCULAR | Status: AC
  Filled 2018-01-07: qty 1.2

## 2018-01-07 SURGICAL SUPPLY — 40 items
ADH SKN CLS APL DERMABOND .7 (GAUZE/BANDAGES/DRESSINGS) ×1
BAG DECANTER FOR FLEXI CONT (MISCELLANEOUS) ×3 IMPLANT
BIOPATCH RED 1 DISK 7.0 (GAUZE/BANDAGES/DRESSINGS) ×2 IMPLANT
BIOPATCH RED 1IN DISK 7.0MM (GAUZE/BANDAGES/DRESSINGS) ×1
CATH PALINDROME RT-P 15FX55CM (CATHETERS) ×2 IMPLANT
COVER PROBE W GEL 5X96 (DRAPES) ×3 IMPLANT
COVER SURGICAL LIGHT HANDLE (MISCELLANEOUS) ×3 IMPLANT
DECANTER SPIKE VIAL GLASS SM (MISCELLANEOUS) ×3 IMPLANT
DERMABOND ADVANCED (GAUZE/BANDAGES/DRESSINGS) ×2
DERMABOND ADVANCED .7 DNX12 (GAUZE/BANDAGES/DRESSINGS) ×1 IMPLANT
DRAPE C-ARM 42X72 X-RAY (DRAPES) ×3 IMPLANT
DRAPE CHEST BREAST 15X10 FENES (DRAPES) ×3 IMPLANT
GAUZE SPONGE 4X4 16PLY XRAY LF (GAUZE/BANDAGES/DRESSINGS) ×3 IMPLANT
GLOVE BIO SURGEON STRL SZ7 (GLOVE) ×3 IMPLANT
GLOVE BIOGEL PI IND STRL 7.5 (GLOVE) ×1 IMPLANT
GLOVE BIOGEL PI INDICATOR 7.5 (GLOVE) ×2
GOWN STRL REUS W/ TWL LRG LVL3 (GOWN DISPOSABLE) ×2 IMPLANT
GOWN STRL REUS W/TWL LRG LVL3 (GOWN DISPOSABLE) ×6
KIT BASIN OR (CUSTOM PROCEDURE TRAY) ×3 IMPLANT
KIT TURNOVER KIT B (KITS) ×3 IMPLANT
NDL 18GX1X1/2 (RX/OR ONLY) (NEEDLE) ×1 IMPLANT
NDL HYPO 25GX1X1/2 BEV (NEEDLE) ×1 IMPLANT
NEEDLE 18GX1X1/2 (RX/OR ONLY) (NEEDLE) ×3 IMPLANT
NEEDLE HYPO 25GX1X1/2 BEV (NEEDLE) ×3 IMPLANT
NS IRRIG 1000ML POUR BTL (IV SOLUTION) ×3 IMPLANT
PACK SURGICAL SETUP 50X90 (CUSTOM PROCEDURE TRAY) ×3 IMPLANT
PAD ARMBOARD 7.5X6 YLW CONV (MISCELLANEOUS) ×6 IMPLANT
SET MICROPUNCTURE 5F STIFF (MISCELLANEOUS) IMPLANT
SOAP 2 % CHG 4 OZ (WOUND CARE) ×3 IMPLANT
SUT ETHILON 3 0 PS 1 (SUTURE) ×3 IMPLANT
SUT MNCRL AB 4-0 PS2 18 (SUTURE) ×3 IMPLANT
SYR 10ML LL (SYRINGE) ×3 IMPLANT
SYR 20CC LL (SYRINGE) ×6 IMPLANT
SYR 3ML LL SCALE MARK (SYRINGE) ×3 IMPLANT
SYR 5ML LL (SYRINGE) ×3 IMPLANT
SYR CONTROL 10ML LL (SYRINGE) ×3 IMPLANT
TOWEL GREEN STERILE (TOWEL DISPOSABLE) ×3 IMPLANT
TOWEL GREEN STERILE FF (TOWEL DISPOSABLE) ×3 IMPLANT
WATER STERILE IRR 1000ML POUR (IV SOLUTION) ×3 IMPLANT
WIRE AMPLATZ SS-J .035X180CM (WIRE) IMPLANT

## 2018-01-07 NOTE — Anesthesia Procedure Notes (Signed)
Procedure Name: Intubation Date/Time: 01/07/2018 1:07 PM Performed by: Gwyndolyn Saxon, CRNA Pre-anesthesia Checklist: Patient identified, Emergency Drugs available, Suction available, Patient being monitored and Timeout performed Patient Re-evaluated:Patient Re-evaluated prior to induction Oxygen Delivery Method: Circle system utilized Preoxygenation: Pre-oxygenation with 100% oxygen Induction Type: IV induction Ventilation: Oral airway inserted - appropriate to patient size and Mask ventilation with difficulty Laryngoscope Size: Glidescope and 3 Grade View: Grade I Tube type: Oral Tube size: 7.0 mm Number of attempts: 1 Airway Equipment and Method: Patient positioned with wedge pillow Placement Confirmation: ETT inserted through vocal cords under direct vision,  positive ETCO2,  CO2 detector and breath sounds checked- equal and bilateral Secured at: 22 cm Tube secured with: Tape Dental Injury: Teeth and Oropharynx as per pre-operative assessment  Comments: Attempted to remove upper dentures prior to going to OR; unable to. Ok to proceed per Dr Linna Caprice

## 2018-01-07 NOTE — Interval H&P Note (Signed)
   History and Physical Update  The patient was interviewed and re-examined.  The patient's previous History and Physical has been reviewed and is unchanged from Dr. Claretha Cooper consult except for: interval removal of LIJV TDC and reversal of coumadin.   Lab Results  Component Value Date   INR 1.71 01/07/2018   INR 2.37 01/06/2018   INR 2.23 01/05/2018    There is no change in the plan of care: placement of tunneled dialysis catheter.   The patient is aware the risks of tunneled dialysis catheter placement include but are not limited to: bleeding, infection, central venous injury, pneumothorax, possible venous stenosis, possible malpositioning in the venous system, and possible infections related to long-term catheter presence.   The patient was aware of these risks and agreed to proceed.   Adele Barthel, MD, FACS [facility] Office: [phone] Pager: [phone]  01/07/2018, 12:10 PM

## 2018-01-07 NOTE — Progress Notes (Signed)
   Patient seen post procedure noted to be doing well.  Catheter is okay for use.  I would not pull catheter back unless he is having issues as it would need to be re-tunneled.  He has follow-up in our office on June 25 for suture removal.    Willow Reczek C. Donzetta Matters, MD [facility] Office: [phone] Pager: [phone]

## 2018-01-07 NOTE — Progress Notes (Signed)
PROGRESS NOTE    Oscar Castillo  KKX:[id number] DOB: 29-Sep-1950 DOA: 12/30/2017 PCP: [name], MD   Brief Narrative:  67 year old BM PMHx Bipolar disorder, [place] 2009, Chronic Encephalopathy, CHOLELITHIASIS, Chronic combined systolic and diastolic CHF, HTN, LBBB,PAF (paroxysmal atrial fibrillation). DIABETES MELLITUS, TYPE II uncontrolled with complication, ESRD on HD, GI bleed, Hepatitis C, Hx Costridium difficile, Hx Nephrolithiasis  Morbid obesity. Bedbound, bilateral BKA,S/P PEG tube placement.   Ongoing encephalopathy for the last few months, multiple recent admissions in the last 3 months, who was discharged from this hospital a month ago, stayed in [place] for a few weeks thereafter was discharged to SNF from where he was brought again to [facility] on 12/30/2017 for sepsis and encephalopathy, his encephalopathy seems to be acute on chronic after discussing with wife it appears like he has been confused more or less for the last 2 months.   He was seen and treated under pulmonary critical care from 12/30/2017 to 01/02/2018, has been transferred under my service on 01/02/2018.  Still encephalopathic.   Meantime blood cultures 1 out of 2 positive for coag negative staph, he did have extremely elevated procalcitonin with the left IJ tunneled dialysis catheter, case was discussed with ID and left IJ dialysis catheter was removed on 01/03/2018 plan is to place a temporary catheter on 01/05/2018 by vascular surgery for dialysis, give him a total of 10 days of IV antibiotics thereafter place a new tunneled catheter.  Overall extremely poor quality of life and still signs of sepsis, wife adamant to continue aggressive measures.      Subjective: 6/14 A/O x1 (does not know where, when, why).  Does not follow commands.  Answers all questions ya or yes.      Assessment & Plan:   Active Problems:   Type 2 diabetes mellitus with chronic kidney disease on chronic dialysis, with  long-term current use of insulin (HCC)   Essential (primary) hypertension   ESRD on hemodialysis (HCC)   S/P BKA (below knee amputation) bilateral (HCC)   Paroxysmal atrial fibrillation (HCC)   Anemia due to stage 5 chronic kidney disease (Real)   DM (diabetes mellitus) with peripheral vascular complication (HCC)   Severe sepsis (HCC)  Severe sepsis - No clear source. - Pneumonia?  On admission, however PCXR not consistent with pneumonia and repeat PCXR again does not support acute infection. - Mild leukocytosis with elevated procalcitonin however patient has ESRD on HD which could falsely elevate findings. - Patient does have Left BKA multiple ulcerations though ulcerations not consistent with osteomyelitis.  LLE x-ray pending. - Another possible site of infection is LEFT HD cath.  Removed by Dr. Donzetta Matters vascular surgery.    Dr. Donzetta Matters vascular surgery will place temporary HD cath on 6/13 if patient's INR<1.8.  -1/2 blood cultures positive for coag negative staph: Contamination? -Per Dr. Gildardo Cranker note he discussed case with ID Dr. Dwyane Dee.  See above plan -Continue current antibiotic regimen empirically.    Acute on Chronic Encephalopathy - Patient had EXTREMELY poor prognosis, has been deteriorating over the last several months. - We will arrange family conference this week in to again address patient poor prognosis.  Per Dr. Gildardo Cranker note discussed case with patient's wife on 6/11 and she continues to have extremely poor understanding of patient's current long-term prognosis.  Continue aggressive measures for now. - Patient's Encephalopathy has been deteriorating over several months although patient has been receiving maximal treatment for his multiple medical conditions without improvement therefore unlikely  to improve.  Chronic systolic and diastolic CHF - EF= 50%, down from 50% in 2015  -Strict in and out since admission +6.3 L -Daily weight Filed Weights   01/06/18 0519 01/07/18  0500 01/07/18 1232  Weight: 201 lb 8 oz (91.4 kg) 201 lb 1 oz (91.2 kg) 201 lb 1 oz (91.2 kg)  - Transfuse for hemoglobin<8 -Amiodarone 200 mg daily -Metoprolol 12.5 mg BID - Patient with elevated HT, Midodrine 5 mg BID (hold) - Repeat echocardiogram in 1 to 2 months if patient survives this hospitalization.  Paroxysmal atrial fibrillation -See CHF -Currently NSR -Coumadin(Hold).  Awaiting procedure on 6/13   Dysphasia - N.p.o. secondary to acute on chronic encephalopathy.  -PEG tube feeds  Enteritis -on CT scan.  C. difficile negative, GI pathogen panel negative.  Likely diarrhea due to osmolar diuresis from tube feeds.  Continue to monitor.  ESRD on HD T/Th/Sat  -HD per nephrology -6/14 S/P placement RIGHT tunnel Femoral HD cath.  Radiology called and recommended cath be pulled back 4 cm however was reevaluated by Dr. Donzetta Matters vascular surgery who states catheter and appropriate position and ready for HD   Bilateral BKA/stage II sacral decubitus ulcer -Cleanse wounds to bilateral lower legs with soap and water and pat dry. SIlicone foam dressing to pad and protect. Change every three days and PRN soilage.   LEFT LOWER extremity lacerations - Patient with multiple LLE lacerations on his BKA stump in various stages of healing tender to palpation. - See sacral decubitus ulcer  Diabetes type 2 controlled with complication - 5/39 hemoglobin A1c= 6.8 -Sensitive SSI  HLD -Lipid panel within ADA guidelines. -Lipitor 40 mg daily      DVT prophylaxis: Heparin--> Coumadin on hold; HD cath placement on 6/13  Code Status: Full Family Communication: None Disposition Plan: TBD   Consultants:  PCCM Nephrology ID phone consult Vascular surgery WOC nurse   Procedures/Significant Events:  6/14 RIGHT Femoral tunneled HD cath     I have personally reviewed and interpreted all radiology studies and my findings are as above.  VENTILATOR  SETTINGS:    Cultures   Antimicrobials: Anti-infectives (From admission, onward)   Start     Stop   01/06/18 1200  vancomycin (VANCOCIN) IVPB 1000 mg/200 mL premix         01/04/18 1200  vancomycin (VANCOCIN) IVPB 1000 mg/200 mL premix  Status:  Discontinued     01/03/18 1029   01/04/18 0915  vancomycin (VANCOCIN) 500 mg in sodium chloride 0.9 % 100 mL IVPB     01/04/18 1232   01/03/18 1200  vancomycin (VANCOCIN) IVPB 1000 mg/200 mL premix     01/03/18 1131   01/01/18 1200  vancomycin (VANCOCIN) IVPB 1000 mg/200 mL premix     01/02/18 0000   12/30/17 2200  piperacillin-tazobactam (ZOSYN) IVPB 3.375 g  Status:  Discontinued     01/03/18 1611   12/30/17 1315  vancomycin (VANCOCIN) 2,000 mg in sodium chloride 0.9 % 500 mL IVPB     12/30/17 1517   12/30/17 1230  vancomycin (VANCOCIN) IVPB 1000 mg/200 mL premix  Status:  Discontinued     12/30/17 1229   12/30/17 1230  piperacillin-tazobactam (ZOSYN) IVPB 3.375 g     12/30/17 1314       Devices    LINES / TUBES:      Continuous Infusions: . sodium chloride Stopped (01/02/18 1800)  . sodium chloride 10 mL/hr at 01/05/18 0907  . heparin  Objective: Vitals:   01/07/18 0015 01/07/18 0202 01/07/18 0500 01/07/18 0742  BP: (!) 157/89     Pulse:      Resp: (!) 27     Temp: 98.9 F (37.2 C) 98.7 F (37.1 C)  98.6 F (37 C)  TempSrc:    Oral  SpO2:      Weight:   201 lb 1 oz (91.2 kg)   Height:        Intake/Output Summary (Last 24 hours) at 01/07/2018 0936 Last data filed at 01/07/2018 0500 Gross per 24 hour  Intake 258 ml  Output 475 ml  Net -217 ml   Filed Weights   01/05/18 0906 01/06/18 0519 01/07/18 0500  Weight: 203 lb 14.8 oz (92.5 kg) 201 lb 8 oz (91.4 kg) 201 lb 1 oz (91.2 kg)    Physical Exam:    General: A/O x0 (does not know where, when, why) mumbles nonsensically, No acute respiratory distress Neck:  Negative scars, masses, torticollis, lymphadenopathy, JVD Lungs: Clear to auscultation  bilaterally without wheezes or crackles Cardiovascular: Regular rate and rhythm without murmur gallop or rub normal S1 and S2 Abdomen: negative abdominal pain, nondistended, positive soft, bowel sounds, no rebound, no ascites, no appreciable mass, PEG tube in place negative sign of infection Extremities: bilateral BKA, LEFT BKA multiple lacerations/ulcers in various stages of healing.   Skin: Stage II sacral decubitus ulcer , left chest wall multiple sutures where HD cath removed clean dry negative discharge.  Right femoral HD cath present negative sign of infection or bleeding Psychiatric: Evaluate secondary to acute on chronic encephalopathy Central nervous system: Moves all extremities, unable to evaluate further secondary to acute on chronic encephalopathy  .     Data Reviewed: Care during the described time interval was provided by me .  I have reviewed this patient's available data, including medical history, events of note, physical examination, and all test results as part of my evaluation.   CBC: Recent Labs  Lab 01/03/18 0513 01/04/18 0717 01/05/18 0820 01/06/18 0636 01/07/18 0726  WBC 9.3 9.0 11.1* 10.1 10.3  HGB 8.8* 8.7* 8.8* 9.2* 9.2*  HCT 27.7* 27.7* 28.1* 28.4* 28.9*  MCV 87.9 87.9 88.4 85.8 87.8  PLT 343 374 422* 508* 161*   Basic Metabolic Panel: Recent Labs  Lab 01/01/18 0328 01/02/18 0404 01/03/18 0720 01/05/18 0820 01/06/18 0636 01/07/18 0726  NA 133* 139 136 138 137 135  K 4.6 3.9 3.3* 3.9 4.2 4.6  CL 94* 96* 97* 99* 99* 99*  CO2 24 30 28 26 26 23   GLUCOSE 179* 179* 191* 135* 152* 133*  BUN 88* 36* 66* 51* 69* 92*  CREATININE 10.07* 5.28* 6.92* 6.15* 7.66* 9.15*  CALCIUM 8.9 9.1 8.7* 9.0 8.9 9.0  MG 2.4 2.3  --   --  2.2 2.2  PHOS 2.7 1.2* 1.0* 1.0* 2.4*  --    GFR: Estimated Creatinine Clearance: 9 mL/min (A) (by C-G formula based on SCr of 9.15 mg/dL (H)). Liver Function Tests: Recent Labs  Lab 01/03/18 0720 01/05/18 0820  ALBUMIN 2.1*  2.1*   No results for input(s): LIPASE, AMYLASE in the last 168 hours. No results for input(s): AMMONIA in the last 168 hours. Coagulation Profile: Recent Labs  Lab 01/03/18 0513 01/04/18 0717 01/05/18 0820 01/06/18 0636 01/07/18 0726  INR 1.80 2.45 2.23 2.37 1.71   Cardiac Enzymes: No results for input(s): CKTOTAL, CKMB, CKMBINDEX, TROPONINI in the last 168 hours. BNP (last 3 results) No results for input(s): PROBNP  in the last 8760 hours. HbA1C: No results for input(s): HGBA1C in the last 72 hours. CBG: Recent Labs  Lab 01/06/18 1807 01/06/18 2034 01/06/18 2351 01/07/18 0356 01/07/18 0740  GLUCAP 140* 172* 186* 151* 133*   Lipid Profile: Recent Labs    01/06/18 0636  CHOL 68  HDL 19*  LDLCALC 30  TRIG 93  CHOLHDL 3.6   Thyroid Function Tests: No results for input(s): TSH, T4TOTAL, FREET4, T3FREE, THYROIDAB in the last 72 hours. Anemia Panel: No results for input(s): VITAMINB12, FOLATE, FERRITIN, TIBC, IRON, RETICCTPCT in the last 72 hours. Urine analysis:    Component Value Date/Time   COLORURINE YELLOW 05/15/2013 0007   APPEARANCEUR CLEAR 05/15/2013 0007   LABSPEC 1.014 05/15/2013 0007   PHURINE 8.5 (H) 05/15/2013 0007   GLUCOSEU 250 (A) 05/15/2013 0007   GLUCOSEU 500 (?) 11/06/2009 0948   HGBUR NEGATIVE 05/15/2013 0007   BILIRUBINUR NEGATIVE 05/15/2013 0007   KETONESUR NEGATIVE 05/15/2013 0007   PROTEINUR >300 (A) 05/15/2013 0007   UROBILINOGEN 0.2 05/15/2013 0007   NITRITE NEGATIVE 05/15/2013 0007   LEUKOCYTESUR NEGATIVE 05/15/2013 0007   Sepsis Labs: @LABRCNTIP (procalcitonin:4,lacticidven:4)  ) Recent Results (from the past 240 hour(s))  Blood Culture (routine x 2)     Status: Abnormal   Collection Time: 12/30/17 12:54 PM  Result Value Ref Range Status   Specimen Description BLOOD SITE NOT SPECIFIED  Final   Special Requests   Final    BOTTLES DRAWN AEROBIC AND ANAEROBIC Blood Culture adequate volume   Culture  Setup Time   Final    GRAM  POSITIVE COCCI IN CLUSTERS AEROBIC BOTTLE ONLY CRITICAL RESULT CALLED TO, READ BACK BY AND VERIFIED WITH: PHARMD E MARTIN [id number] MLM    Culture (A)  Final    STAPHYLOCOCCUS SPECIES (COAGULASE NEGATIVE) THE SIGNIFICANCE OF ISOLATING THIS ORGANISM FROM A SINGLE SET OF BLOOD CULTURES WHEN MULTIPLE SETS ARE DRAWN IS UNCERTAIN. PLEASE NOTIFY THE MICROBIOLOGY DEPARTMENT WITHIN ONE WEEK IF SPECIATION AND SENSITIVITIES ARE REQUIRED. Performed at [facility], [place] [address], [place], [place] [address]    Report Status 01/02/2018 FINAL  Final  Blood Culture ID Panel (Reflexed)     Status: Abnormal   Collection Time: 12/30/17 12:54 PM  Result Value Ref Range Status   Enterococcus species NOT DETECTED NOT DETECTED Final   Listeria monocytogenes NOT DETECTED NOT DETECTED Final   Staphylococcus species DETECTED (A) NOT DETECTED Final    Comment: Methicillin (oxacillin) resistant coagulase negative staphylococcus. Possible blood culture contaminant (unless isolated from more than one blood culture draw or clinical case suggests pathogenicity). No antibiotic treatment is indicated for blood  culture contaminants. CRITICAL RESULT CALLED TO, READ BACK BY AND VERIFIED WITH: PHARMD E MARTIN [id number] MLM    Staphylococcus aureus NOT DETECTED NOT DETECTED Final   Methicillin resistance DETECTED (A) NOT DETECTED Final    Comment: CRITICAL RESULT CALLED TO, READ BACK BY AND VERIFIED WITH: PHARMD E MARTIN [id number] MLM    Streptococcus species NOT DETECTED NOT DETECTED Final   Streptococcus agalactiae NOT DETECTED NOT DETECTED Final   Streptococcus pneumoniae NOT DETECTED NOT DETECTED Final   Streptococcus pyogenes NOT DETECTED NOT DETECTED Final   Acinetobacter baumannii NOT DETECTED NOT DETECTED Final   Enterobacteriaceae species NOT DETECTED NOT DETECTED Final   Enterobacter cloacae complex NOT DETECTED NOT DETECTED Final   Escherichia coli NOT DETECTED NOT DETECTED Final   Klebsiella  oxytoca NOT DETECTED NOT DETECTED Final   Klebsiella pneumoniae NOT DETECTED  NOT DETECTED Final   Proteus species NOT DETECTED NOT DETECTED Final   Serratia marcescens NOT DETECTED NOT DETECTED Final   Haemophilus influenzae NOT DETECTED NOT DETECTED Final   Neisseria meningitidis NOT DETECTED NOT DETECTED Final   Pseudomonas aeruginosa NOT DETECTED NOT DETECTED Final   Candida albicans NOT DETECTED NOT DETECTED Final   Candida glabrata NOT DETECTED NOT DETECTED Final   Candida krusei NOT DETECTED NOT DETECTED Final   Candida parapsilosis NOT DETECTED NOT DETECTED Final   Candida tropicalis NOT DETECTED NOT DETECTED Final    Comment: Performed at [facility], [place] [address], [place], [place] [address]  Blood Culture (routine x 2)     Status: None   Collection Time: 12/30/17 12:55 PM  Result Value Ref Range Status   Specimen Description BLOOD RIGHT ANTECUBITAL  Final   Special Requests   Final    BOTTLES DRAWN AEROBIC AND ANAEROBIC Blood Culture adequate volume   Culture   Final    NO GROWTH 5 DAYS Performed at [facility], [place] [address] [place], [place] [address]    Report Status 01/04/2018 FINAL  Final  MRSA PCR Screening     Status: None   Collection Time: 12/30/17  3:16 PM  Result Value Ref Range Status   MRSA by PCR NEGATIVE NEGATIVE Final    Comment:        The GeneXpert MRSA Assay (FDA approved for NASAL specimens only), is one component of a comprehensive MRSA colonization surveillance program. It is not intended to diagnose MRSA infection nor to guide or monitor treatment for MRSA infections. Performed at [facility], [place] [address], [place], [place] [address]   C difficile quick scan w PCR reflex     Status: None   Collection Time: 01/01/18  8:09 AM  Result Value Ref Range Status   C Diff antigen NEGATIVE NEGATIVE Final   C Diff toxin NEGATIVE NEGATIVE Final   C Diff interpretation No C. difficile detected.  Final  Culture, blood (routine  x 2)     Status: None (Preliminary result)   Collection Time: 01/02/18  8:43 AM  Result Value Ref Range Status   Specimen Description BLOOD RIGHT ANTECUBITAL  Final   Special Requests   Final    BOTTLES DRAWN AEROBIC AND ANAEROBIC Blood Culture adequate volume   Culture   Final    NO GROWTH 4 DAYS Performed at [facility], [place] [address], [place], [place] [address]    Report Status PENDING  Incomplete  Culture, blood (routine x 2)     Status: None (Preliminary result)   Collection Time: 01/02/18  8:53 AM  Result Value Ref Range Status   Specimen Description BLOOD RIGHT ANTECUBITAL  Final   Special Requests   Final    BOTTLES DRAWN AEROBIC AND ANAEROBIC Blood Culture adequate volume   Culture   Final    NO GROWTH 4 DAYS Performed at [facility], [place] [address], [place],  [address]    Report Status PENDING  Incomplete  Culture, blood (routine x 2)     Status: None (Preliminary result)   Collection Time: 01/03/18  1:52 PM  Result Value Ref Range Status   Specimen Description BLOOD RIGHT HAND  Final   Special Requests   Final    BOTTLES DRAWN AEROBIC ONLY Blood Culture adequate volume   Culture   Final    NO GROWTH 3 DAYS Performed at [facility], [place] [address], [place], [place] [address]    Report Status PENDING  Incomplete  Culture, blood (routine x 2)     Status: None (Preliminary result)   Collection Time: 01/03/18  1:52 PM  Result Value Ref Range Status   Specimen Description BLOOD RIGHT HAND  Final   Special Requests   Final    BOTTLES DRAWN AEROBIC ONLY Blood Culture results may not be optimal due to an inadequate volume of blood received in culture bottles   Culture   Final    NO GROWTH 3 DAYS Performed at [facility], [place] [address] [place], [place] [address]    Report Status PENDING  Incomplete  Surgical PCR screen     Status: None   Collection Time: 01/05/18  8:30 PM  Result Value Ref Range Status   MRSA, PCR NEGATIVE NEGATIVE  Final   Staphylococcus aureus NEGATIVE NEGATIVE Final    Comment: (NOTE) The Xpert SA Assay (FDA approved for NASAL specimens in patients 70 years of age and older), is one component of a comprehensive surveillance program. It is not intended to diagnose infection nor to guide or monitor treatment. Performed at [facility], [place] [address] [place], [place] [address]          Radiology Studies: Dg Tibia/fibula Left  Result Date: 01/05/2018 CLINICAL DATA:  Pain at stump of LEFT BKA, distal wound, fever EXAM: LEFT TIBIA AND FIBULA - 2 VIEW COMPARISON:  08/24/2016 FINDINGS: Diffuse osseous demineralization. Degenerative changes at the LEFT knee joint with joint space narrowing and spur formation. Stumps of the LEFT tibia and fibula appear intact and unchanged from the previous exam. No definite acute bone destruction identified to suggest osteomyelitis. Extensive atherosclerotic calcifications. IMPRESSION: Post LEFT BKA. No definite acute stump abnormalities identified. Electronically Signed   By: Lavonia Dana M.D.   On: 01/05/2018 12:51   Mr Tibia Fibula Left Wo Contrast  Result Date: 01/06/2018 CLINICAL DATA:  Osteomyelitis of the left lower extremity. EXAM: MRI OF LOWER LEFT EXTREMITY WITHOUT CONTRAST TECHNIQUE: Multiplanar, multisequence MR imaging of the left leg was performed. No intravenous contrast was administered. COMPARISON:  None. FINDINGS: Limited evaluation of left lower leg secondary to significant patient motion degrading image quality. Bones/Joint/Cartilage Below the knee amputation. No focal marrow signal abnormality in the stump of proximal tibia and fibula. No periosteal reaction or bone destruction. No fracture or dislocation. Normal alignment. No joint effusion. High-grade partial-thickness cartilage loss of the medial femorotibial compartment. Partial-thickness cartilage loss of the medial patellofemoral compartment. Ligaments Collateral ligaments are intact. Muscles  and Tendons T2 hyperintense signal in hamstring musculature and gastrocnemius musculature likely neurogenic. Quadriceps tendon and patellar tendon are grossly intact Soft tissue Ulcer on tip of the stump is not well delineated on the MRI secondary to patient motion. No fluid collection or hematoma. No soft tissue mass. IMPRESSION: 1. Below the knee amputation of left lower leg. No osteomyelitis of stump of the left tibia and fibula. Electronically Signed   By: Kathreen Devoid   On: 01/06/2018 19:24        Scheduled Meds: . amiodarone  200 mg Per Tube Daily  . atorvastatin  40 mg Per Tube QHS  . [START ON 01/11/2018] darbepoetin (ARANESP) injection - DIALYSIS  60 mcg Intravenous Q Tue-HD  . escitalopram  10 mg Per Tube Daily  . famotidine  20 mg Oral QHS  . feeding supplement (NEPRO CARB STEADY)  1,000 mL Per Tube Q24H  .  feeding supplement (PRO-STAT SUGAR FREE 64)  30 mL Per Tube TID  . insulin aspart  0-9 Units Subcutaneous Q4H  . latanoprost  1 drop Both Eyes QHS  . mouth rinse  15 mL Mouth Rinse BID  . metoprolol tartrate  12.5 mg Per Tube BID   Continuous Infusions: . sodium chloride Stopped (01/02/18 1800)  . sodium chloride 10 mL/hr at 01/05/18 0907  . heparin       LOS: 8 days    Time spent: 40 minutes    WOODS, Geraldo Docker, MD [facility] Pager [phone]   If 7PM-7AM, please contact night-coverage [email or web address] Password [facility] 01/07/2018, 9:36 AM

## 2018-01-07 NOTE — Progress Notes (Signed)
Notified Dr. Donzetta Matters of recommendation to pull HD cath back by 6cm.

## 2018-01-07 NOTE — Progress Notes (Signed)
ANTIBIOTIC CONSULT NOTE   Pharmacy Consult for Heparin/ Vanco Indication: Afib with h/o CVA, CNS bacteremia  Allergies  Allergen Reactions  . Ceftriaxone Sodium In Dextrose Anaphylaxis    Tolerates Zosyn  . Ciprofloxacin Other (See Comments)    UNSPECIFIED PAIN  . Nsaids Other (See Comments)    HISTORY OF ULCER  . Penicillins Swelling    Tolerates Zosyn [ PATIENT WITH HX OF ANAPHYLAXIS TO CEFTRIAXONE ] 11/26/17:  pt has received 3 doses of zosyn in past (04/2017.) No allergy tor adverse event to zosyn noted at that time.     . Sulfa Antibiotics Swelling    SWELLING REACTION UNSPECIFIED     Patient Measurements: Height: 5\' 10"  (177.8 cm) Weight: 201 lb 1 oz (91.2 kg) IBW/kg (Calculated) : 73 Adjusted Body Weight:    Vital Signs: Temp: 98.6 F (37 C) (06/14 0742) Temp Source: Oral (06/14 0742) BP: 157/89 (06/14 0015) Intake/Output from previous day: 06/13 0701 - 06/14 0700 In: 258 [NG/GT:238] Out: 475 [Stool:475] Intake/Output from this shift: No intake/output data recorded.  Labs: Recent Labs    01/05/18 0820 01/06/18 0636 01/07/18 0726  WBC 11.1* 10.1 10.3  HGB 8.8* 9.2* 9.2*  PLT 422* 508* 498*  CREATININE 6.15* 7.66* 9.15*   Estimated Creatinine Clearance: 9 mL/min (A) (by C-G formula based on SCr of 9.15 mg/dL (H)). No results for input(s): VANCOTROUGH, VANCOPEAK, VANCORANDOM, GENTTROUGH, GENTPEAK, GENTRANDOM, TOBRATROUGH, TOBRAPEAK, TOBRARND, AMIKACINPEAK, AMIKACINTROU, AMIKACIN in the last 72 hours.   Microbiology:   Medical History: Past Medical History:  Diagnosis Date  . Anemia   . Antral ulcer 2014   small  . BENIGN PROSTATIC HYPERTROPHY   . Bipolar disorder ([place])    "sometimes" (10/07/2016)  . CHOLELITHIASIS   . Chronic combined systolic and diastolic CHF (congestive heart failure) ([place])   . Complication of anesthesia    wife states pt had trouble waking up with in Nov., 2014  . CVA (cerebral vascular accident) ([place]) 07/2007   No residual  effect  . DEPRESSION   . DIABETES MELLITUS, TYPE II    diet control.  No medication  since November 2015  . ERECTILE DYSFUNCTION   . ESRD on hemodialysis ([place])    ESRD due to DM/HTN. .  HD TTS at [facility] on [address].  [name] GERD   . GI bleed    due to gastritis, discharged 10/02/16/notes 10/07/2016  . Hepatitis C    C - has been treated  . History of Clostridium difficile   . History of kidney stones   . HYPERTENSION   . LBBB (left bundle branch block)   . Morbid obesity ([place])   . PAF (paroxysmal atrial fibrillation) ([place])    a. Dx 12/2015.   Assessment: Anticoag: Warf PTA for PAF with hx CVA - INR 3.17 on admit, rose to 7.03, then <2 after Vit K on 6/8.  Hep bridge initiated. Hgb up to 9.2. Plts up to 508 - Per MAR in shadow chart, just increased Coumadin from 5 to 5.5 mg daily on 6/3 - 6/11:  Dr. Candiss Norse did not want to give more Vit K on 6/11. Asked during rounds. - 6/12: HL 0.2, INR 2.23 trended back up, Hgb and plts stable from yesterday. +FOBT. Heparin turned off this AM for procedure>cancelled due to high INR> do not resume per Dr. Sherral Hammers until [place]. PM Text paged Dr. Sherral Hammers about more Vit K, but no response. - 6/13: INR 2.37. Heparin remains off due INR>2. Vit K  5mg  po x 1 - 6/13: INR 1.71. Start IV heparin   ID: Vanco for CNS bacteremia, WBC 10.3, LA 2.6, PCT 2.97 (6/13), Tmax 99.3 Removed HD cath 6/10, plan line holiday and replace temp catheter when INR<1.8. Per Dr. Candiss Norse, plan 7d of Vancomycin after line pulled (stop 6/17).   6/6 Vanco >> (6/17) - extra 1/2 dose 500mg  Vanco 6/11 due to fever per MD - dose 1000mg  given 6/13 despite having no HD. 6/6 Zosyn >> 6/10  6/6 BCx: 1/2 coag neg staph 6/9 BCx: NGTD 6/10 BCx: NGTD 6/8: C Diff antigen/toxin negative MRSA PCR negative  Goal of Therapy:  Heparin level 0.3-0.7 Vancomycin level <15 prior to re-dosing post-HD.  Plan:  - Vancomycin 1000mg  IV qHD TTS- given 6/13 despite having no HD (SZP). D/c'd order and will  obtain random level after HD line replaced and has HD today. - Heparin start at 1400 units/hr (no bolus) - Hold Coumadin until after line placement   Minela Bridgewater S. Alford Highland, PharmD, BCPS Clinical Staff Pharmacist Pager [phone]  [place], [place] 01/07/2018,8:52 AM

## 2018-01-07 NOTE — Progress Notes (Signed)
ANTIBIOTIC CONSULT NOTE   Pharmacy Consult for Heparin/ Vanco Indication: Afib with h/o CVA, CNS bacteremia  Allergies  Allergen Reactions  . Ceftriaxone Sodium In Dextrose Anaphylaxis    Tolerates Zosyn  . Ciprofloxacin Other (See Comments)    UNSPECIFIED PAIN  . Nsaids Other (See Comments)    HISTORY OF ULCER  . Penicillins Swelling    Tolerates Zosyn [ PATIENT WITH HX OF ANAPHYLAXIS TO CEFTRIAXONE ] 11/26/17:  pt has received 3 doses of zosyn in past (04/2017.) No allergy tor adverse event to zosyn noted at that time.     . Sulfa Antibiotics Swelling    SWELLING REACTION UNSPECIFIED     Patient Measurements: Height: 5\' 10"  (177.8 cm) Weight: 201 lb 1 oz (91.2 kg) IBW/kg (Calculated) : 73 Adjusted Body Weight:    Vital Signs: Temp: 98.1 F (36.7 C) (06/14 1422) Temp Source: Oral (06/14 1118) BP: 135/45 (06/14 1422) Pulse Rate: 88 (06/14 1422) Intake/Output from previous day: 06/13 0701 - 06/14 0700 In: 258 [NG/GT:238] Out: 475 [Stool:475] Intake/Output from this shift: Total I/O In: 250 [I.V.:250] Out: -   Labs: Recent Labs    01/05/18 0820 01/06/18 0636 01/07/18 0726  WBC 11.1* 10.1 10.3  HGB 8.8* 9.2* 9.2*  PLT 422* 508* 498*  CREATININE 6.15* 7.66* 9.15*   Estimated Creatinine Clearance: 9 mL/min (A) (by C-G formula based on SCr of 9.15 mg/dL (H)). No results for input(s): VANCOTROUGH, VANCOPEAK, VANCORANDOM, GENTTROUGH, GENTPEAK, GENTRANDOM, TOBRATROUGH, TOBRAPEAK, TOBRARND, AMIKACINPEAK, AMIKACINTROU, AMIKACIN in the last 72 hours.   Microbiology:   Medical History: Past Medical History:  Diagnosis Date  . Anemia   . Antral ulcer 2014   small  . BENIGN PROSTATIC HYPERTROPHY   . Bipolar disorder ([place])    "sometimes" (10/07/2016)  . CHOLELITHIASIS   . Chronic combined systolic and diastolic CHF (congestive heart failure) ([place])   . Complication of anesthesia    wife states pt had trouble waking up with in Nov., 2014  . CVA (cerebral vascular  accident) ([place]) 07/2007   No residual effect  . DEPRESSION   . DIABETES MELLITUS, TYPE II    diet control.  No medication  since November 2015  . ERECTILE DYSFUNCTION   . ESRD on hemodialysis ([place])    ESRD due to DM/HTN. .  HD TTS at [facility] on [address].  [name] GERD   . GI bleed    due to gastritis, discharged 10/02/16/notes 10/07/2016  . Hepatitis C    C - has been treated  . History of Clostridium difficile   . History of kidney stones   . HYPERTENSION   . LBBB (left bundle branch block)   . Morbid obesity ([place])   . PAF (paroxysmal atrial fibrillation) ([place])    a. Dx 12/2015.   Assessment: Anticoag: Warf PTA for PAF with hx CVA - INR 3.17 on admit, rose to 7.03, then <2 after Vit K on 6/8.  Hep bridge initiated. Hgb up to 9.2. Plts up to 508 - Per MAR in shadow chart, just increased Coumadin from 5 to 5.5 mg daily on 6/3 - 6/11:  Dr. Candiss Norse did not want to give more Vit K on 6/11. Asked during rounds. - 6/12: HL 0.2, INR 2.23 trended back up, Hgb and plts stable from yesterday. +FOBT. Heparin turned off this AM for procedure>cancelled due to high INR> do not resume per Dr. Sherral Hammers until [place]. PM Text paged Dr. Sherral Hammers about more Vit K, but no response. - 6/13:  INR 2.37. Heparin remains off due INR>2. Vit K 5mg  po x 1 - 6/13: INR 1.71. Start IV heparin. Not done this AM. OK to start immediately per Dr. Bridgett Larsson   ID: Vanco for CNS bacteremia, WBC 10.3, LA 2.6, PCT 2.97 (6/13), Tmax 99.3 Removed HD cath 6/10, plan line holiday and replace temp catheter when INR<1.8. Per Dr. Candiss Norse, plan 7d of Vancomycin after line pulled (stop 6/17).   6/6 Vanco >> (6/17) - extra 1/2 dose 500mg  Vanco 6/11 due to fever per MD - 6/13: dose 1000mg  given 6/13 despite having NO HD - 6/14: standing order d/c'd. Patient received ANOTHER Vanco 1g in OR today by CRNA prior to HD catheter replacement.- Total accumulated dose 3500mg  since last HD session on 6/10 6/6 Zosyn >> 6/10  6/6 BCx: 1/2 coag neg staph 6/9  BCx: NGTD 6/10 BCx: NGTD 6/8: C Diff antigen/toxin negative MRSA PCR negative  Goal of Therapy:  Heparin level 0.3-0.7 Vancomycin level <15 prior to re-dosing post-HD.  Plan:  - Heparin start at 1400 units/hr (no bolus) when patient gets to HD. - Hold Coumadin until after line placement - Hold Vancomycin and check random level after a session or two   Leler Brion S. Alford Highland, PharmD, BCPS Clinical Staff Pharmacist Pager [phone]  Eilene Ghazi [place] 01/07/2018,3:25 PM

## 2018-01-07 NOTE — Anesthesia Postprocedure Evaluation (Signed)
Anesthesia Post Note  Patient: Oscar Castillo  Procedure(s) Performed: INSERTION OF DIALYSIS CATHETER - RIGHT FEMORAL PLACEMENT (Right Groin)     Patient location during evaluation: PACU Anesthesia Type: General Level of consciousness: awake, lethargic and confused Pain management: pain level controlled Vital Signs Assessment: post-procedure vital signs reviewed and stable Respiratory status: spontaneous breathing, nonlabored ventilation, respiratory function stable and patient connected to nasal cannula oxygen Cardiovascular status: blood pressure returned to baseline and stable Postop Assessment: no apparent nausea or vomiting Anesthetic complications: no    Last Vitals:  Vitals:   01/07/18 1422 01/07/18 1623  BP: (!) 135/45   Pulse: 88   Resp: 20   Temp: 36.7 C 36.8 C  SpO2: 95%     Last Pain:  Vitals:   01/07/18 1623  TempSrc: Axillary  PainSc:                  Hardie Veltre COKER

## 2018-01-07 NOTE — Progress Notes (Addendum)
[facility] Progress Note   Subjective: Opens eyes to verbal stimuli-says "un huh" to every question. Does not follow commands.     Objective Vitals:   01/07/18 0015 01/07/18 0202 01/07/18 0500 01/07/18 0742  BP: (!) 157/89     Pulse:      Resp: (!) 27     Temp: 98.9 F (37.2 C) 98.7 F (37.1 C)  98.6 F (37 C)  TempSrc:    Oral  SpO2:      Weight:   91.2 kg (201 lb 1 oz)   Height:       Physical Exam General:Chronically ill appearing male in NAD Followed some commands for me but minimal. Obese, chronically ill.    Jerking/myoclonic Heart: HS distant-S1.S2. SR 1st AVB on monitor. Gr 2/6 M Lungs:CTAB decreased bs Abdomen: active BS Liver down 5 cm Extremities: Bilateral BKA. Multiple ulcers L BKA. Eschar mid thigh ant also Dialysis Access: No HD access at present.   Additional Objective Labs: Basic Metabolic Panel: Recent Labs  Lab 01/03/18 0720 01/05/18 0820 01/06/18 0636 01/07/18 0726  NA 136 138 137 135  K 3.3* 3.9 4.2 4.6  CL 97* 99* 99* 99*  CO2 28 26 26 23   GLUCOSE 191* 135* 152* 133*  BUN 66* 51* 69* 92*  CREATININE 6.92* 6.15* 7.66* 9.15*  CALCIUM 8.7* 9.0 8.9 9.0  PHOS 1.0* 1.0* 2.4*  --    Liver Function Tests: Recent Labs  Lab 01/03/18 0720 01/05/18 0820  ALBUMIN 2.1* 2.1*   No results for input(s): LIPASE, AMYLASE in the last 168 hours. CBC: Recent Labs  Lab 01/03/18 0513 01/04/18 0717 01/05/18 0820 01/06/18 0636 01/07/18 0726  WBC 9.3 9.0 11.1* 10.1 10.3  HGB 8.8* 8.7* 8.8* 9.2* 9.2*  HCT 27.7* 27.7* 28.1* 28.4* 28.9*  MCV 87.9 87.9 88.4 85.8 87.8  PLT 343 374 422* 508* 498*   Blood Culture    Component Value Date/Time   SDES BLOOD RIGHT HAND 01/03/2018 1352   SDES BLOOD RIGHT HAND 01/03/2018 1352   SPECREQUEST  01/03/2018 1352    BOTTLES DRAWN AEROBIC ONLY Blood Culture adequate volume   SPECREQUEST  01/03/2018 1352    BOTTLES DRAWN AEROBIC ONLY Blood Culture results may not be optimal due to an inadequate  volume of blood received in culture bottles   CULT  01/03/2018 1352    NO GROWTH 3 DAYS Performed at [facility], [place] [address], [place], [place] [address]    CULT  01/03/2018 1352    NO GROWTH 3 DAYS Performed at [facility], [place] [address], [place],  [address]    REPTSTATUS PENDING 01/03/2018 1352   REPTSTATUS PENDING 01/03/2018 1352    Cardiac Enzymes: No results for input(s): CKTOTAL, CKMB, CKMBINDEX, TROPONINI in the last 168 hours. CBG: Recent Labs  Lab 01/06/18 1807 01/06/18 2034 01/06/18 2351 01/07/18 0356 01/07/18 0740  GLUCAP 140* 172* 186* 151* 133*   Iron Studies: No results for input(s): IRON, TIBC, TRANSFERRIN, FERRITIN in the last 72 hours. @lablastinr3 @ Studies/Results: Dg Tibia/fibula Left  Result Date: 01/05/2018 CLINICAL DATA:  Pain at stump of LEFT BKA, distal wound, fever EXAM: LEFT TIBIA AND FIBULA - 2 VIEW COMPARISON:  08/24/2016 FINDINGS: Diffuse osseous demineralization. Degenerative changes at the LEFT knee joint with joint space narrowing and spur formation. Stumps of the LEFT tibia and fibula appear intact and unchanged from the previous exam. No definite acute bone destruction identified to suggest osteomyelitis. Extensive atherosclerotic calcifications. IMPRESSION: Post LEFT  BKA. No definite acute stump abnormalities identified. Electronically Signed   By: Lavonia Dana M.D.   On: 01/05/2018 12:51   Mr Tibia Fibula Left Wo Contrast  Result Date: 01/06/2018 CLINICAL DATA:  Osteomyelitis of the left lower extremity. EXAM: MRI OF LOWER LEFT EXTREMITY WITHOUT CONTRAST TECHNIQUE: Multiplanar, multisequence MR imaging of the left leg was performed. No intravenous contrast was administered. COMPARISON:  None. FINDINGS: Limited evaluation of left lower leg secondary to significant patient motion degrading image quality. Bones/Joint/Cartilage Below the knee amputation. No focal marrow signal abnormality in the stump of proximal tibia and  fibula. No periosteal reaction or bone destruction. No fracture or dislocation. Normal alignment. No joint effusion. High-grade partial-thickness cartilage loss of the medial femorotibial compartment. Partial-thickness cartilage loss of the medial patellofemoral compartment. Ligaments Collateral ligaments are intact. Muscles and Tendons T2 hyperintense signal in hamstring musculature and gastrocnemius musculature likely neurogenic. Quadriceps tendon and patellar tendon are grossly intact Soft tissue Ulcer on tip of the stump is not well delineated on the MRI secondary to patient motion. No fluid collection or hematoma. No soft tissue mass. IMPRESSION: 1. Below the knee amputation of left lower leg. No osteomyelitis of stump of the left tibia and fibula. Electronically Signed   By: Kathreen Devoid   On: 01/06/2018 19:24   Medications: . sodium chloride Stopped (01/02/18 1800)  . sodium chloride 10 mL/hr at 01/05/18 0907  . heparin     . amiodarone  200 mg Per Tube Daily  . atorvastatin  40 mg Per Tube QHS  . [START ON 01/11/2018] darbepoetin (ARANESP) injection - DIALYSIS  60 mcg Intravenous Q Tue-HD  . escitalopram  10 mg Per Tube Daily  . famotidine  20 mg Oral QHS  . feeding supplement (NEPRO CARB STEADY)  1,000 mL Per Tube Q24H  . feeding supplement (PRO-STAT SUGAR FREE 64)  30 mL Per Tube TID  . insulin aspart  0-9 Units Subcutaneous Q4H  . latanoprost  1 drop Both Eyes QHS  . mouth rinse  15 mL Mouth Rinse BID  . metoprolol tartrate  12.5 mg Per Tube BID     Dialysis Orders: [place] [facility]  4h 88kg 2/2 bath p4 L TDC heparin 4000 then 2000 midrun -venofer 100 mg x 10 to start 6/8  Assessment/Plan: 1Fevers/ sepsis - no clear etiology. 1/2 BC positive for CNS. Possible catheter infection- L TDC removed 01/03/18 per Dr. Donzetta Matters. For [facility] placement today. Tmax 99.3 WBC 10.3. Vanc per pharmacy. Per primary.  Suspect cath vs stump.   Concern for osteo D/T discoloration/multiple ulcerations L  BKA. MRI tibia/fibula negative for osteo 01/06/18. 2 Confusion - Still confused. Says "Un Huh" to every question. Not following commands. Wife says better 3 ESRD - usual HD TTS. K+ 4.6. For [facility] placement today-INR 1.71. Will have HD today off schedule and again tomorrow to get back on schedule. No heparin-FOBT Positive.  4 Hypotension/volume- BP stable with SBP trending in 150s. On midodrine. Not sure he needs this. Will see how he tolerates HD today.  HD today post [facility] placement. About 2 kgs over OP EDW. Attempt to get to OP EDW.  5 Anemia ckd - HGB 9.2 01/07/18. HGB trending up.  Rec'd  60 Aranesp given 6/10. FOBT positive 01/04/18. ^ Aranesp 6 mbd ckd - Ca 9.0 C Ca 10.4 Phos 2.4 01/06/18 Binders on hold. 2.0 Ca bath. No VDRA. Lower Ca with HD 7 Afib - on anticoag with + hemocult. INR up today, will give po  vit K 5 mg to correct for HD cath procedure tomorrow. Limited benefit anticoag 8 DM2 9 FTT - failure to thrive 10. Nutrition - nepro at 40 per PEG/prostat alb 2.1  11. Stage 2 Decub - Having liquid stools. Has flexaseal. 11Obesity    Rita H. Brown NP-C 01/07/2018, 9:40 AM  [employer] [phone]  I have seen and examined this patient and agree with the plan of care seen, eval, examined, discussed with wife, NP. Jeneen Rinks Josias Tomerlin 01/07/2018, 11:12 AM

## 2018-01-07 NOTE — Transfer of Care (Signed)
Immediate Anesthesia Transfer of Care Note  Patient: Oscar Castillo  Procedure(s) Performed: INSERTION OF DIALYSIS CATHETER - RIGHT FEMORAL PLACEMENT (Right Groin)  Patient Location: PACU  Anesthesia Type:General  Level of Consciousness: responds to stimulation  Airway & Oxygen Therapy: Patient Spontanous Breathing and Patient connected to face mask oxygen  Post-op Assessment: Report given to RN and Post -op Vital signs reviewed and stable  Post vital signs: Reviewed and stable  Last Vitals:  Vitals Value Taken Time  BP 148/48 01/07/2018  1:52 PM  Temp 36.4 C 01/07/2018  1:52 PM  Pulse 85 01/07/2018  1:57 PM  Resp 30 01/07/2018  1:57 PM  SpO2 100 % 01/07/2018  1:57 PM  Vitals shown include unvalidated device data.  Last Pain:  Vitals:   01/07/18 1352  TempSrc:   PainSc: (P) 0-No pain         Complications: No apparent anesthesia complications

## 2018-01-07 NOTE — Plan of Care (Signed)
Patient is making progress toward discharge, plan of care continnues

## 2018-01-07 NOTE — Progress Notes (Signed)
CSW to continue to follow for return to [facility] at discharge. Will attempt to confirm plan with patient' spouse.   Percell Locus Monnie Anspach LCSW [phone]

## 2018-01-07 NOTE — Op Note (Signed)
OPERATIVE NOTE  PROCEDURE: 1. right femoral vein tunneled dialysis catheter placement 2. right} femoral vein cannulation under ultrasound guidance  PRE-OPERATIVE DIAGNOSIS: hemodialysis dependence  POST-OPERATIVE DIAGNOSIS: same as above  SURGEON: Adele Barthel, MD  ANESTHESIA: general  ESTIMATED BLOOD LOSS: 30 cc  FINDING(S): 1.  Tip of the catheter in the right atrium on fluoroscopy  SPECIMEN(S):  none  INDICATIONS:   Oscar Castillo is a 67 y.o. male who  presents with hemodialysis dependence.  The patient presents for tunneled dialysis catheter placement.  The patient is aware the risks of tunneled dialysis catheter placement include but are not limited to: bleeding, infection, central venous injury, pneumothorax, possible venous stenosis, possible malpositioning in the venous system, and possible infections related to long-term catheter presence.  The patient was aware of these risks and agreed to proceed.   DESCRIPTION: After written full informed consent was obtained from the patient, the patient was taken back to the operating room.  Prior to induction, the patient was given IV antibiotics.  After obtaining adequate sedation, the patient was prepped and draped in the standard fashion for a femoral vein tunneled dialysis catheter placement.    The cannulation site, the catheter exit site, and tract for the subcutaneous tunnel were then anesthesized with a total of 20 cc of 1% lidocaine without epinephrine.  Under ultrasound guidance, the right femoral vein was cannulated with a 18 gauge needle.  A J-wire was then placed into the inferior vena cava under fluroscopic guidance.  The wire was then secured in place with a clamp to the drapes.    I then made stab incisions at the cannulation and exit sites.  I dissected from the exit site to the cannulation site with a metal dissector.  The wire was then unclamped and I removed the needle.  The skin tract and venotomy was dilated  serially with graduated dilators.  Finally, the dilator-sheath was placed under fluroscopic guidance into the iliac vein.  The dilator and wire were removed.    A 55 cm Palindrome catheter was placed under fluoroscopic guidance into the right atrium.  The sheath was broken and peeled away while holding the catheter cuff at the level of the skin.  The catheter was clamped with the plastic clamp.  The back of the catheter was transected revealing the two lumen.  The dissector was docked onto one of these lumens.  The dissector with the back end of this catheter was pulled through the subcutaneous tunnel.  The catheter was again clamped and the back end of the catheter was transected again to reveal the two lumens again.  The catheter collar was placed over the catheter and then two ports loaded onto the two lumens.  The catheter collar was snapped into place, securing the two ports.  The catheter was pulled into appropriate position under fluoroscopy.  Each port was tested by aspirating and flushing.  No resistance was noted.  Each port was then thoroughly flushed with heparinized saline.  The catheter was secured in placed with two interrupted stitches of 3-0 Nylon tied to the catheter.  The cannulation incision was closed with a U-stitch of 4-0 Monocryl.  The cannulation and exit incisions were cleaned and sterile bandages applied.  Each port was then loaded with concentrated heparin (1000 Units/mL) at the manufacturer recommended volumes to each port.  Sterile caps were applied to each port.    On completion fluoroscopy, the tips of the catheter were in the right atrium,  and there was no evidence of pneumothorax.   COMPLICATIONS: none   CONDITION: guarded   Adele Barthel, MD, FACS [facility] Office: [phone] Pager: [phone]  01/07/2018, 1:34 PM

## 2018-01-08 ENCOUNTER — Encounter (HOSPITAL_COMMUNITY): Payer: Self-pay | Admitting: Vascular Surgery

## 2018-01-08 LAB — PROTIME-INR
INR: 1.65
Prothrombin Time: 19.4 seconds — ABNORMAL HIGH (ref 11.4–15.2)

## 2018-01-08 LAB — CULTURE, BLOOD (ROUTINE X 2)
CULTURE: NO GROWTH
Culture: NO GROWTH
Special Requests: ADEQUATE

## 2018-01-08 LAB — CBC
HEMATOCRIT: 29.5 % — AB (ref 39.0–52.0)
HEMOGLOBIN: 9.3 g/dL — AB (ref 13.0–17.0)
MCH: 27.8 pg (ref 26.0–34.0)
MCHC: 31.5 g/dL (ref 30.0–36.0)
MCV: 88.1 fL (ref 78.0–100.0)
PLATELETS: 544 10*3/uL — AB (ref 150–400)
RBC: 3.35 MIL/uL — AB (ref 4.22–5.81)
RDW: 16.4 % — ABNORMAL HIGH (ref 11.5–15.5)
WBC: 10 10*3/uL (ref 4.0–10.5)

## 2018-01-08 LAB — GLUCOSE, CAPILLARY
GLUCOSE-CAPILLARY: 204 mg/dL — AB (ref 65–99)
GLUCOSE-CAPILLARY: 224 mg/dL — AB (ref 65–99)
GLUCOSE-CAPILLARY: 201 mg/dL — AB (ref 65–99)
GLUCOSE-CAPILLARY: 181 mg/dL — AB (ref 65–99)
Glucose-Capillary: 167 mg/dL — ABNORMAL HIGH (ref 65–99)

## 2018-01-08 LAB — BASIC METABOLIC PANEL
ANION GAP: 13 (ref 5–15)
BUN: 100 mg/dL — ABNORMAL HIGH (ref 6–20)
CALCIUM: 9.1 mg/dL (ref 8.9–10.3)
CHLORIDE: 97 mmol/L — AB (ref 101–111)
CO2: 26 mmol/L (ref 22–32)
Creatinine, Ser: 9.92 mg/dL — ABNORMAL HIGH (ref 0.61–1.24)
GFR calc Af Amer: 6 mL/min — ABNORMAL LOW (ref >60)
GFR calc non Af Amer: 5 mL/min — ABNORMAL LOW (ref >60)
GLUCOSE: 188 mg/dL — AB (ref 65–99)
Potassium: 4.7 mmol/L (ref 3.5–5.1)
Sodium: 136 mmol/L (ref 135–145)

## 2018-01-08 LAB — AMMONIA: Ammonia: 23 umol/L (ref 9–35)

## 2018-01-08 LAB — HEPARIN LEVEL (UNFRACTIONATED)
HEPARIN UNFRACTIONATED: 0.63 [IU]/mL (ref 0.30–0.70)
Heparin Unfractionated: <0.1 IU/mL — ABNORMAL LOW (ref 0.30–0.70)
Heparin Unfractionated: 0.23 IU/mL — ABNORMAL LOW (ref 0.30–0.70)

## 2018-01-08 LAB — MAGNESIUM: Magnesium: 2.5 mg/dL — ABNORMAL HIGH (ref 1.7–2.4)

## 2018-01-08 LAB — PHOSPHORUS: Phosphorus: 3.5 mg/dL (ref 2.5–4.6)

## 2018-01-08 MED ORDER — HEPARIN SODIUM (PORCINE) 1000 UNIT/ML DIALYSIS
1000.0000 [IU] | INTRAMUSCULAR | Status: DC | PRN
  Filled 2018-01-08: qty 1

## 2018-01-08 MED ORDER — ALTEPLASE 2 MG IJ SOLR
2.0000 mg | Freq: Once | INTRAMUSCULAR | Status: DC | PRN
  Filled 2018-01-08: qty 2

## 2018-01-08 MED ORDER — PENTAFLUOROPROP-TETRAFLUOROETH EX AERO: 1.0000 "application " | INHALATION_SPRAY | CUTANEOUS | Status: DC | PRN

## 2018-01-08 MED ORDER — LIDOCAINE HCL (PF) 1 % IJ SOLN
5.0000 mL | INTRAMUSCULAR | Status: DC | PRN
  Filled 2018-01-08: qty 5

## 2018-01-08 MED ORDER — SODIUM CHLORIDE 0.9 % IV SOLN: 100.0000 mL | INTRAVENOUS | Status: DC | PRN

## 2018-01-08 MED ORDER — LIDOCAINE-PRILOCAINE 2.5-2.5 % EX CREA
1.0000 "application " | TOPICAL_CREAM | CUTANEOUS | Status: DC | PRN
  Filled 2018-01-08: qty 5

## 2018-01-08 NOTE — Progress Notes (Signed)
Unable to complete 0400 assessment.  Patient is in dialysis.

## 2018-01-08 NOTE — Progress Notes (Signed)
Subjective: Interval History: awake, has fem cath, responses. "HUH"  Objective: Vital signs in last 24 hours: Temp:  [97.1 F (36.2 C)-99.6 F (37.6 C)] 98.1 F (36.7 C) (06/15 0805) Pulse Rate:  [80-93] 85 (06/15 0805) Resp:  [13-27] 17 (06/15 0805) BP: (80-148)/(27-85) 91/27 (06/15 0805) SpO2:  [95 %-100 %] 100 % (06/15 0805) Weight:  [89.9 kg (198 lb 3.1 oz)-93.8 kg (206 lb 12.7 oz)] 89.9 kg (198 lb 3.1 oz) (06/15 0805) Weight change: 0 kg (0 lb)  Intake/Output from previous day: 06/14 0701 - 06/15 0700 In: 1389.7 [I.V.:811; NG/GT:568.7] Out: -  Intake/Output this shift: Total I/O In: -  Out: 2527 [Other:2527]  General appearance: moderately obese, uncooperative and confused no approp responses Resp: diminished breath sounds bilaterally Cardio: S1, S2 normal and systolic murmur: systolic ejection 2/6, decrescendo at 2nd left intercostal space GI: obese, pos bs, soft, liver down 5 cm Extremities: R groin PC, bilat BKAs  L stump warm , abrasion L mid thigh  Lab Results: Recent Labs    01/07/18 0726 01/08/18 0008  WBC 10.3 10.0  HGB 9.2* 9.3*  HCT 28.9* 29.5*  PLT 498* 544*   BMET:  Recent Labs    01/07/18 0726 01/08/18 0008  NA 135 136  K 4.6 4.7  CL 99* 97*  CO2 23 26  GLUCOSE 133* 188*  BUN 92* 100*  CREATININE 9.15* 9.92*  CALCIUM 9.0 9.1   No results for input(s): PTH in the last 72 hours. Iron Studies: No results for input(s): IRON, TIBC, TRANSFERRIN, FERRITIN in the last 72 hours.  Studies/Results: Mr Tibia Fibula Left Wo Contrast  Result Date: 01/06/2018 CLINICAL DATA:  Osteomyelitis of the left lower extremity. EXAM: MRI OF LOWER LEFT EXTREMITY WITHOUT CONTRAST TECHNIQUE: Multiplanar, multisequence MR imaging of the left leg was performed. No intravenous contrast was administered. COMPARISON:  None. FINDINGS: Limited evaluation of left lower leg secondary to significant patient motion degrading image quality. Bones/Joint/Cartilage Below the knee  amputation. No focal marrow signal abnormality in the stump of proximal tibia and fibula. No periosteal reaction or bone destruction. No fracture or dislocation. Normal alignment. No joint effusion. High-grade partial-thickness cartilage loss of the medial femorotibial compartment. Partial-thickness cartilage loss of the medial patellofemoral compartment. Ligaments Collateral ligaments are intact. Muscles and Tendons T2 hyperintense signal in hamstring musculature and gastrocnemius musculature likely neurogenic. Quadriceps tendon and patellar tendon are grossly intact Soft tissue Ulcer on tip of the stump is not well delineated on the MRI secondary to patient motion. No fluid collection or hematoma. No soft tissue mass. IMPRESSION: 1. Below the knee amputation of left lower leg. No osteomyelitis of stump of the left tibia and fibula. Electronically Signed   By: Kathreen Devoid   On: 01/06/2018 19:24   Dg Chest Port 1 View  Result Date: 01/07/2018 CLINICAL DATA:  Dialysis catheter placement EXAM: PORTABLE CHEST 1 VIEW COMPARISON:  January 02, 2018 FINDINGS: The right femoral dialysis catheter extends through the right atrium into the SVC. Recommend withdrawing approximately 6 cm. The heart, hila, mediastinum, lungs, and pleura are otherwise unchanged and unremarkable. IMPRESSION: The right femoral dialysis catheter extends through the right atrium into the SVC. Recommend withdrawing approximately 6 cm. These results will be called to the ordering clinician or representative by the Radiologist Assistant, and communication documented in the PACS or zVision Dashboard. Electronically Signed   By: Dorise Bullion III M.D   On: 01/07/2018 16:41   Dg Cyndy Freeze Guide Cv Line-no Report  Result Date: 01/07/2018  Fluoroscopy was utilized by the requesting physician.  No radiographic interpretation.    I have reviewed the patient's current medications.  Assessment/Plan: 1 ESRD HD late due to sched, will do tomorrow 2 Anemia  stable, esa 3 DM  Controlled 4 HPTH  5 Dementia  6 AMS 7 Sepsis on AB, low grade,  Cath vs stump P HD, esa, AB, NOT CANDIDATE FOR OUTPATIENT DIALYSIS AT THIS TIME   LOS: 9 days   Oscar Castillo 01/08/2018,8:36 AM

## 2018-01-08 NOTE — Progress Notes (Signed)
HD tx initiated via HD cath w/ cath function issues. Radiologists had noted on their reading of cath placement xray that they suggest the cath be pulled back 6 cm. MD wrote note saying he doesn't recommend it be pulled back unless there are issues as the cath would need to be re tunneled. Cath definitely needs to be pulled back as it will not work properly running straight nor reversed. I never got above a BFR of 275 w/ initiation of tx and I anticipate I will only be turning it down throughout tx as the AP is too high. bilat ports pull/push/flush well, but not running the way you'd expect compared to how they feel pulling and pushing. VSS, will cont to monitor while on HD tx

## 2018-01-08 NOTE — Progress Notes (Signed)
ANTICOAGULATION CONSULT NOTE - Follow Up Consult  Pharmacy Consult for Heparin Indication: PAF with h/o CVA  Allergies  Allergen Reactions  . Ceftriaxone Sodium In Dextrose Anaphylaxis    Tolerates Zosyn  . Ciprofloxacin Other (See Comments)    UNSPECIFIED PAIN  . Nsaids Other (See Comments)    HISTORY OF ULCER  . Penicillins Swelling    Tolerates Zosyn [ PATIENT WITH HX OF ANAPHYLAXIS TO CEFTRIAXONE ] 11/26/17:  pt has received 3 doses of zosyn in past (04/2017.) No allergy tor adverse event to zosyn noted at that time.     . Sulfa Antibiotics Swelling    SWELLING REACTION UNSPECIFIED     Patient Measurements: Height: 5\' 10"  (177.8 cm) Weight: 201 lb 1 oz (91.2 kg) IBW/kg (Calculated) : 73 Heparin Dosing Weight:    Vital Signs: Temp: 98.6 F (37 C) (06/14 2016) Temp Source: Axillary (06/14 2016) BP: 137/79 (06/14 1830) Pulse Rate: 87 (06/14 1425)  Labs: Recent Labs    01/05/18 0820 01/06/18 0636 01/07/18 0726 01/08/18 0008  HGB 8.8* 9.2* 9.2* 9.3*  HCT 28.1* 28.4* 28.9* 29.5*  PLT 422* 508* 498* 544*  LABPROT 24.5* 25.7* 19.9* 19.4*  INR 2.23 2.37 1.71 1.65  HEPARINUNFRC 0.20*  --   --  <0.10*  CREATININE 6.15* 7.66* 9.15* 9.92*    Estimated Creatinine Clearance: 8.3 mL/min (A) (by C-G formula based on SCr of 9.92 mg/dL (H)).   Assessment:  Anticoag: Heparin level <0.1 units/hr Goal of Therapy:  Heparin level 0.3-0.7 units/ml Monitor platelets by anticoagulation protocol: Yes   Plan:  - Increase heparin to 1600 units/hr -Check heparin level later today  Gaylen Pereira Poteet 01/08/2018,2:07 AM

## 2018-01-08 NOTE — Procedures (Signed)
I was present at this session.  I have reviewed the session itself and made appropriate changes.  HD via R groin PC. Low flows, may need repositioned.    Oscar Castillo 6/15/20198:35 AM

## 2018-01-08 NOTE — Progress Notes (Signed)
PROGRESS NOTE    Oscar Castillo  LNL:[id number] DOB: 1950/10/10 DOA: 12/30/2017 PCP: [name], MD   Brief Narrative:  68 year old BM PMHx Bipolar disorder, [place] 2009, Chronic Encephalopathy, CHOLELITHIASIS, Chronic combined systolic and diastolic CHF, HTN, LBBB,PAF (paroxysmal atrial fibrillation). DIABETES MELLITUS, TYPE II uncontrolled with complication, ESRD on HD, GI bleed, Hepatitis C, Hx Costridium difficile, Hx Nephrolithiasis  Morbid obesity. Bedbound, bilateral BKA,S/P PEG tube placement.   Ongoing encephalopathy for the last few months, multiple recent admissions in the last 3 months, who was discharged from this hospital a month ago, stayed in [place] for a few weeks thereafter was discharged to SNF from where he was brought again to [facility] on 12/30/2017 for sepsis and encephalopathy, his encephalopathy seems to be acute on chronic after discussing with wife it appears like he has been confused more or less for the last 2 months.   He was seen and treated under pulmonary critical care from 12/30/2017 to 01/02/2018, has been transferred under my service on 01/02/2018.  Still encephalopathic.   Meantime blood cultures 1 out of 2 positive for coag negative staph, he did have extremely elevated procalcitonin with the left IJ tunneled dialysis catheter, case was discussed with ID and left IJ dialysis catheter was removed on 01/03/2018 plan is to place a temporary catheter on 01/05/2018 by vascular surgery for dialysis, give him a total of 10 days of IV antibiotics thereafter place a new tunneled catheter.  Overall extremely poor quality of life and still signs of sepsis, wife adamant to continue aggressive measures.      Subjective: 6/15 A/O x1 (does not want where, when, why).  Follows some commands.  Answers yes to all questions    Assessment & Plan:   Active Problems:   Type 2 diabetes mellitus with chronic kidney disease on chronic dialysis, with long-term  current use of insulin (HCC)   Essential (primary) hypertension   ESRD on hemodialysis (HCC)   S/P BKA (below knee amputation) bilateral (HCC)   Paroxysmal atrial fibrillation (HCC)   Anemia due to stage 5 chronic kidney disease ([place])   DM (diabetes mellitus) with peripheral vascular complication (HCC)   Severe sepsis (HCC)  Severe sepsis - No clear source. - Pneumonia?  On admission, however PCXR not consistent with pneumonia and repeat PCXR again does not support acute infection. - Mild leukocytosis with elevated procalcitonin however patient has ESRD on HD which could falsely elevate findings. - Patient does have Left BKA multiple ulcerations though ulcerations not consistent with osteomyelitis.  LLE x-ray pending. - Another possible site of infection is LEFT HD cath.  Removed by Dr. Donzetta Matters vascular surgery.  - 6/15 S/P RIGHT femoral temporary Vas-Cath placed by Dr. Donzetta Matters vascular surgery -Continue current antibiotic regimen empirically.    Acute on Chronic Encephalopathy - Patient had EXTREMELY poor prognosis, has been deteriorating over the last several months. - We will arrange family conference this week in to again address patient poor prognosis.  Per Dr. Gildardo Cranker note discussed case with patient's wife on 6/11 and she continues to have extremely poor understanding of patient's current long-term prognosis.  Continue aggressive measures for now. - Patient's Encephalopathy has been deteriorating over several months although patient has been receiving maximal treatment for his multiple medical conditions without improvement therefore unlikely to improve. -6/15 no improvement  Chronic systolic and diastolic CHF - EF= 40%, down from 50% in 2015  -Strict in and out since admission +4.9 L -Daily weight [employer]  01/07/18 1232 01/08/18 0347 01/08/18 0805  Weight: 201 lb 1 oz (91.2 kg) 206 lb 12.7 oz (93.8 kg) 198 lb 3.1 oz (89.9 kg)  - Transfuse for hemoglobin<8 -Amiodarone 200  mg daily -Metoprolol 12.5 mg BID - Patient with elevated HT, Midodrine 5 mg BID (hold) - Repeat echocardiogram in 1 to 2 months if patient survives this hospitalization.  Paroxysmal atrial fibrillation -See CHF -Currently NSR -Coumadin(Hold).  Awaiting procedure on 6/13 -6/15 restart heparin per pharmacy   Dysphasia - N.p.o. secondary to acute on chronic encephalopathy.  -Continue PEG tube feeds  - Unable to elevate bed> 30 degrees secondary to patient's femoral HD Vas-Cath  Enteritis -on CT scan.  C. difficile negative, GI pathogen panel negative.  Likely diarrhea due to osmolar diuresis from tube feeds.  Continue to monitor.  ESRD on HD T/Th/Sat  -HD per nephrology -6/14 S/P placement RIGHT tunnel Femoral HD cath.  Radiology called and recommended cath be pulled back 4 cm however was reevaluated by Dr. Donzetta Matters vascular surgery who states catheter and appropriate position and ready for HD   Bilateral BKA/stage II sacral decubitus ulcer -Cleanse wounds to bilateral lower legs with soap and water and pat dry. SIlicone foam dressing to pad and protect. Change every three days and PRN soilage.   LEFT LOWER extremity lacerations - Patient with multiple LLE lacerations on his BKA stump in various stages of healing tender to palpation. - See sacral decubitus ulcer  Diabetes type 2 controlled with complication - 5/64 hemoglobin A1c= 6.8 -Sensitive SSI  HLD -Lipid panel within ADA guidelines. -Lipitor 40 mg daily      DVT prophylaxis: Heparin--> Coumadin on hold; HD cath placement on 6/13  Code Status: Full Family Communication: None Disposition Plan: Once cleared by nephrology SNF vs LTACH   Consultants:  PCCM Nephrology ID phone consult Vascular surgery WOC nurse   Procedures/Significant Events:  6/14 RIGHT Femoral tunneled HD cath     I have personally reviewed and interpreted all radiology studies and my findings are as above.  VENTILATOR  SETTINGS:    Cultures   Antimicrobials: Anti-infectives (From admission, onward)   Start     Stop   01/06/18 1200  vancomycin (VANCOCIN) IVPB 1000 mg/200 mL premix         01/04/18 1200  vancomycin (VANCOCIN) IVPB 1000 mg/200 mL premix  Status:  Discontinued     01/03/18 1029   01/04/18 0915  vancomycin (VANCOCIN) 500 mg in sodium chloride 0.9 % 100 mL IVPB     01/04/18 1232   01/03/18 1200  vancomycin (VANCOCIN) IVPB 1000 mg/200 mL premix     01/03/18 1131   01/01/18 1200  vancomycin (VANCOCIN) IVPB 1000 mg/200 mL premix     01/02/18 0000   12/30/17 2200  piperacillin-tazobactam (ZOSYN) IVPB 3.375 g  Status:  Discontinued     01/03/18 1611   12/30/17 1315  vancomycin (VANCOCIN) 2,000 mg in sodium chloride 0.9 % 500 mL IVPB     12/30/17 1517   12/30/17 1230  vancomycin (VANCOCIN) IVPB 1000 mg/200 mL premix  Status:  Discontinued     12/30/17 1229   12/30/17 1230  piperacillin-tazobactam (ZOSYN) IVPB 3.375 g     12/30/17 1314       Devices    LINES / TUBES:      Continuous Infusions: . sodium chloride Stopped (01/02/18 1800)  . sodium chloride 10 mL/hr at 01/05/18 0907  . sodium chloride    . sodium chloride    .  sodium chloride 10 mL/hr at 01/07/18 1900  . heparin 1,600 Units/hr (01/08/18 0214)     Objective: Vitals:   01/08/18 0700 01/08/18 0730 01/08/18 0800 01/08/18 0805  BP: 102/65 97/61 (!) 80/48 (!) 91/57  Pulse: 83 93 85 85  Resp: 17 16 17 17   Temp:    98.1 F (36.7 C)  TempSrc:    Oral  SpO2: 98% 100% 100% 100%  Weight:    198 lb 3.1 oz (89.9 kg)  Height:        Intake/Output Summary (Last 24 hours) at 01/08/2018 1019 Last data filed at 01/08/2018 0805 Gross per 24 hour  Intake 1389.66 ml  Output 2527 ml  Net -1137.34 ml   Filed Weights   01/07/18 1232 01/08/18 0347 01/08/18 0805  Weight: 201 lb 1 oz (91.2 kg) 206 lb 12.7 oz (93.8 kg) 198 lb 3.1 oz (89.9 kg)     Physical Exam:  General: A/O x1 (does not know where, when, why)  answers yes to everything) No acute respiratory distress Neck:  Negative scars, masses, torticollis, lymphadenopathy, JVD Lungs: Clear to auscultation bilaterally without wheezes or crackles Cardiovascular: Regular rate and rhythm without murmur gallop or rub normal S1 and S2 Abdomen: negative abdominal pain, nondistended, positive soft, bowel sounds, no rebound, no ascites, no appreciable mass, PEG tube in place negative sign of infection Extremities: bilateral BKA, LEFT BKA multiple lacerations/ulcers in various stages of healing Skin: Stage II sacral decubitus ulcer, left chest wall multiple sutures were HD catheter removed clean dry negative discharge.  RIGHT femoral HD cath present negative sign of infection or bleeding Psychiatric: Unable to evaluate secondary to acute on chronic encephalopathy  Central nervous system: Moves all extremities, Unable to evaluate secondary to acute on chronic encephalopathy*     Data Reviewed: Care during the described time interval was provided by me .  I have reviewed this patient's available data, including medical history, events of note, physical examination, and all test results as part of my evaluation.   CBC: Recent Labs  Lab 01/04/18 0717 01/05/18 0820 01/06/18 0636 01/07/18 0726 01/08/18 0008  WBC 9.0 11.1* 10.1 10.3 10.0  HGB 8.7* 8.8* 9.2* 9.2* 9.3*  HCT 27.7* 28.1* 28.4* 28.9* 29.5*  MCV 87.9 88.4 85.8 87.8 88.1  PLT 374 422* 508* 498* 976*   Basic Metabolic Panel: Recent Labs  Lab 01/02/18 0404 01/03/18 0720 01/05/18 0820 01/06/18 0636 01/07/18 0726 01/08/18 0008 01/08/18 0459  NA 139 136 138 137 135 136  --   K 3.9 3.3* 3.9 4.2 4.6 4.7  --   CL 96* 97* 99* 99* 99* 97*  --   CO2 30 28 26 26 23 26   --   GLUCOSE 179* 191* 135* 152* 133* 188*  --   BUN 36* 66* 51* 69* 92* 100*  --   CREATININE 5.28* 6.92* 6.15* 7.66* 9.15* 9.92*  --   CALCIUM 9.1 8.7* 9.0 8.9 9.0 9.1  --   MG 2.3  --   --  2.2 2.2 2.5*  --   PHOS 1.2*  1.0* 1.0* 2.4*  --   --  3.5   GFR: Estimated Creatinine Clearance: 8.3 mL/min (A) (by C-G formula based on SCr of 9.92 mg/dL (H)). Liver Function Tests: Recent Labs  Lab 01/03/18 0720 01/05/18 0820  ALBUMIN 2.1* 2.1*   No results for input(s): LIPASE, AMYLASE in the last 168 hours. Recent Labs  Lab 01/08/18 0459  AMMONIA 23   Coagulation Profile: Recent Labs  Lab  01/04/18 0717 01/05/18 0820 01/06/18 0636 01/07/18 0726 01/08/18 0008  INR 2.45 2.23 2.37 1.71 1.65   Cardiac Enzymes: No results for input(s): CKTOTAL, CKMB, CKMBINDEX, TROPONINI in the last 168 hours. BNP (last 3 results) No results for input(s): PROBNP in the last 8760 hours. HbA1C: No results for input(s): HGBA1C in the last 72 hours. CBG: Recent Labs  Lab 01/07/18 1152 01/07/18 1352 01/07/18 1617 01/07/18 2015 01/08/18 0034  GLUCAP 123* 178* 164* 173* 167*   Lipid Profile: Recent Labs    01/06/18 0636  CHOL 68  HDL 19*  LDLCALC 30  TRIG 93  CHOLHDL 3.6   Thyroid Function Tests: No results for input(s): TSH, T4TOTAL, FREET4, T3FREE, THYROIDAB in the last 72 hours. Anemia Panel: No results for input(s): VITAMINB12, FOLATE, FERRITIN, TIBC, IRON, RETICCTPCT in the last 72 hours. Urine analysis:    Component Value Date/Time   COLORURINE YELLOW 05/15/2013 0007   APPEARANCEUR CLEAR 05/15/2013 0007   LABSPEC 1.014 05/15/2013 0007   PHURINE 8.5 (H) 05/15/2013 0007   GLUCOSEU 250 (A) 05/15/2013 0007   GLUCOSEU 500 (?) 11/06/2009 0948   HGBUR NEGATIVE 05/15/2013 0007   BILIRUBINUR NEGATIVE 05/15/2013 0007   KETONESUR NEGATIVE 05/15/2013 0007   PROTEINUR >300 (A) 05/15/2013 0007   UROBILINOGEN 0.2 05/15/2013 0007   NITRITE NEGATIVE 05/15/2013 0007   LEUKOCYTESUR NEGATIVE 05/15/2013 0007   Sepsis Labs: @LABRCNTIP (procalcitonin:4,lacticidven:4)  ) Recent Results (from the past 240 hour(s))  Blood Culture (routine x 2)     Status: Abnormal   Collection Time: 12/30/17 12:54 PM  Result  Value Ref Range Status   Specimen Description BLOOD SITE NOT SPECIFIED  Final   Special Requests   Final    BOTTLES DRAWN AEROBIC AND ANAEROBIC Blood Culture adequate volume   Culture  Setup Time   Final    GRAM POSITIVE COCCI IN CLUSTERS AEROBIC BOTTLE ONLY CRITICAL RESULT CALLED TO, READ BACK BY AND VERIFIED WITH: PHARMD E MARTIN [id number] MLM    Culture (A)  Final    STAPHYLOCOCCUS SPECIES (COAGULASE NEGATIVE) THE SIGNIFICANCE OF ISOLATING THIS ORGANISM FROM A SINGLE SET OF BLOOD CULTURES WHEN MULTIPLE SETS ARE DRAWN IS UNCERTAIN. PLEASE NOTIFY THE MICROBIOLOGY DEPARTMENT WITHIN ONE WEEK IF SPECIATION AND SENSITIVITIES ARE REQUIRED. Performed at [facility], [place] [address] [place], [place] [address]    Report Status 01/02/2018 FINAL  Final  Blood Culture ID Panel (Reflexed)     Status: Abnormal   Collection Time: 12/30/17 12:54 PM  Result Value Ref Range Status   Enterococcus species NOT DETECTED NOT DETECTED Final   Listeria monocytogenes NOT DETECTED NOT DETECTED Final   Staphylococcus species DETECTED (A) NOT DETECTED Final    Comment: Methicillin (oxacillin) resistant coagulase negative staphylococcus. Possible blood culture contaminant (unless isolated from more than one blood culture draw or clinical case suggests pathogenicity). No antibiotic treatment is indicated for blood  culture contaminants. CRITICAL RESULT CALLED TO, READ BACK BY AND VERIFIED WITH: PHARMD E MARTIN [id number] MLM    Staphylococcus aureus NOT DETECTED NOT DETECTED Final   Methicillin resistance DETECTED (A) NOT DETECTED Final    Comment: CRITICAL RESULT CALLED TO, READ BACK BY AND VERIFIED WITH: PHARMD E MARTIN [id number] MLM    Streptococcus species NOT DETECTED NOT DETECTED Final   Streptococcus agalactiae NOT DETECTED NOT DETECTED Final   Streptococcus pneumoniae NOT DETECTED NOT DETECTED Final   Streptococcus pyogenes NOT DETECTED NOT DETECTED Final   Acinetobacter baumannii NOT  DETECTED NOT DETECTED Final  Enterobacteriaceae species NOT DETECTED NOT DETECTED Final   Enterobacter cloacae complex NOT DETECTED NOT DETECTED Final   Escherichia coli NOT DETECTED NOT DETECTED Final   Klebsiella oxytoca NOT DETECTED NOT DETECTED Final   Klebsiella pneumoniae NOT DETECTED NOT DETECTED Final   Proteus species NOT DETECTED NOT DETECTED Final   Serratia marcescens NOT DETECTED NOT DETECTED Final   Haemophilus influenzae NOT DETECTED NOT DETECTED Final   Neisseria meningitidis NOT DETECTED NOT DETECTED Final   Pseudomonas aeruginosa NOT DETECTED NOT DETECTED Final   Candida albicans NOT DETECTED NOT DETECTED Final   Candida glabrata NOT DETECTED NOT DETECTED Final   Candida krusei NOT DETECTED NOT DETECTED Final   Candida parapsilosis NOT DETECTED NOT DETECTED Final   Candida tropicalis NOT DETECTED NOT DETECTED Final    Comment: Performed at [facility], [place] [address] [place], [place] [address]  Blood Culture (routine x 2)     Status: None   Collection Time: 12/30/17 12:55 PM  Result Value Ref Range Status   Specimen Description BLOOD RIGHT ANTECUBITAL  Final   Special Requests   Final    BOTTLES DRAWN AEROBIC AND ANAEROBIC Blood Culture adequate volume   Culture   Final    NO GROWTH 5 DAYS Performed at [facility], [place] [address], [place], [place] [address]    Report Status 01/04/2018 FINAL  Final  MRSA PCR Screening     Status: None   Collection Time: 12/30/17  3:16 PM  Result Value Ref Range Status   MRSA by PCR NEGATIVE NEGATIVE Final    Comment:        The GeneXpert MRSA Assay (FDA approved for NASAL specimens only), is one component of a comprehensive MRSA colonization surveillance program. It is not intended to diagnose MRSA infection nor to guide or monitor treatment for MRSA infections. Performed at [facility], [place] [address] [place], [place] [address]   C difficile quick scan w PCR reflex     Status: None   Collection  Time: 01/01/18  8:09 AM  Result Value Ref Range Status   C Diff antigen NEGATIVE NEGATIVE Final   C Diff toxin NEGATIVE NEGATIVE Final   C Diff interpretation No C. difficile detected.  Final  Culture, blood (routine x 2)     Status: None   Collection Time: 01/02/18  8:43 AM  Result Value Ref Range Status   Specimen Description BLOOD RIGHT ANTECUBITAL  Final   Special Requests   Final    BOTTLES DRAWN AEROBIC AND ANAEROBIC Blood Culture adequate volume   Culture   Final    NO GROWTH 5 DAYS Performed at [facility], [address] [place], [place] [address]    Report Status 01/07/2018 FINAL  Final  Culture, blood (routine x 2)     Status: None   Collection Time: 01/02/18  8:53 AM  Result Value Ref Range Status   Specimen Description BLOOD RIGHT ANTECUBITAL  Final   Special Requests   Final    BOTTLES DRAWN AEROBIC AND ANAEROBIC Blood Culture adequate volume   Culture   Final    NO GROWTH 5 DAYS Performed at [facility], [place] [address], [place], [place] [address]    Report Status 01/07/2018 FINAL  Final  Culture, blood (routine x 2)     Status: None (Preliminary result)   Collection Time: 01/03/18  1:52 PM  Result Value Ref Range Status   Specimen Description BLOOD  RIGHT HAND  Final   Special Requests   Final    BOTTLES DRAWN AEROBIC ONLY Blood Culture adequate volume   Culture   Final    NO GROWTH 4 DAYS Performed at [facility], [address], [place], [place] [address]    Report Status PENDING  Incomplete  Culture, blood (routine x 2)     Status: None (Preliminary result)   Collection Time: 01/03/18  1:52 PM  Result Value Ref Range Status   Specimen Description BLOOD RIGHT HAND  Final   Special Requests   Final    BOTTLES DRAWN AEROBIC ONLY Blood Culture results may not be optimal due to an inadequate volume of blood received in culture bottles   Culture   Final    NO GROWTH 4 DAYS Performed at [facility], [place] [address] [place],  [place] [address]    Report Status PENDING  Incomplete  Surgical PCR screen     Status: None   Collection Time: 01/05/18  8:30 PM  Result Value Ref Range Status   MRSA, PCR NEGATIVE NEGATIVE Final   Staphylococcus aureus NEGATIVE NEGATIVE Final    Comment: (NOTE) The Xpert SA Assay (FDA approved for NASAL specimens in patients 15 years of age and older), is one component of a comprehensive surveillance program. It is not intended to diagnose infection nor to guide or monitor treatment. Performed at [facility], [place] [address] [place], [place] [address]          Radiology Studies: Mr Tibia Fibula Left Wo Contrast  Result Date: 01/06/2018 CLINICAL DATA:  Osteomyelitis of the left lower extremity. EXAM: MRI OF LOWER LEFT EXTREMITY WITHOUT CONTRAST TECHNIQUE: Multiplanar, multisequence MR imaging of the left leg was performed. No intravenous contrast was administered. COMPARISON:  None. FINDINGS: Limited evaluation of left lower leg secondary to significant patient motion degrading image quality. Bones/Joint/Cartilage Below the knee amputation. No focal marrow signal abnormality in the stump of proximal tibia and fibula. No periosteal reaction or bone destruction. No fracture or dislocation. Normal alignment. No joint effusion. High-grade partial-thickness cartilage loss of the medial femorotibial compartment. Partial-thickness cartilage loss of the medial patellofemoral compartment. Ligaments Collateral ligaments are intact. Muscles and Tendons T2 hyperintense signal in hamstring musculature and gastrocnemius musculature likely neurogenic. Quadriceps tendon and patellar tendon are grossly intact Soft tissue Ulcer on tip of the stump is not well delineated on the MRI secondary to patient motion. No fluid collection or hematoma. No soft tissue mass. IMPRESSION: 1. Below the knee amputation of left lower leg. No osteomyelitis of stump of the left tibia and fibula. Electronically Signed   By: Kathreen Devoid   On: 01/06/2018 19:24   Dg Chest Port 1 View  Result Date: 01/07/2018 CLINICAL DATA:  Dialysis catheter placement EXAM: PORTABLE CHEST 1 VIEW COMPARISON:  January 02, 2018 FINDINGS: The right femoral dialysis catheter extends through the right atrium into the SVC. Recommend withdrawing approximately 6 cm. The heart, hila, mediastinum, lungs, and pleura are otherwise unchanged and unremarkable. IMPRESSION: The right femoral dialysis catheter extends through the right atrium into the SVC. Recommend withdrawing approximately 6 cm. These results will be called to the ordering clinician or representative by the Radiologist Assistant, and communication documented in the PACS or zVision Dashboard. Electronically Signed   By: Dorise Bullion III M.D   On: 01/07/2018 16:41   Dg Cyndy Freeze Guide Cv Line-no Report  Result Date: 01/07/2018 Fluoroscopy was utilized by the requesting physician.  No radiographic interpretation.        Scheduled Meds: . amiodarone  200 mg Per Tube Daily  . atorvastatin  40 mg Per Tube QHS  . Chlorhexidine Gluconate Cloth  6 each Topical Q0600  . [START ON 01/11/2018] darbepoetin (ARANESP) injection - DIALYSIS  150 mcg Intravenous Q Tue-HD  . escitalopram  10 mg Per Tube Daily  . famotidine  20 mg Oral QHS  . feeding supplement (NEPRO CARB STEADY)  1,000 mL Per Tube Q24H  . feeding supplement (PRO-STAT SUGAR FREE 64)  30 mL Per Tube TID  . insulin aspart  0-9 Units Subcutaneous Q4H  . latanoprost  1 drop Both Eyes QHS  . mouth rinse  15 mL Mouth Rinse BID  . metoprolol tartrate  12.5 mg Per Tube BID  . multivitamin  1 tablet Oral QHS   Continuous Infusions: . sodium chloride Stopped (01/02/18 1800)  . sodium chloride 10 mL/hr at 01/05/18 0907  . sodium chloride    . sodium chloride    . sodium chloride 10 mL/hr at 01/07/18 1900  . heparin 1,600 Units/hr (01/08/18 0214)     LOS: 9 days    Time spent: 40 minutes    WOODS, Geraldo Docker, MD [facility] Pager [phone]   If 7PM-7AM, please contact night-coverage [email or web address] Password [facility] 01/08/2018, 10:19 AM

## 2018-01-08 NOTE — Progress Notes (Signed)
ANTICOAGULATION CONSULT NOTE - Follow Up Consult  Pharmacy Consult for Heparin Indication: PAF with h/o CVA  Patient Measurements: Height: 5\' 10"  (177.8 cm) Weight: 198 lb 3.1 oz (89.9 kg) IBW/kg (Calculated) : 73 Heparin Dosing Weight: 90 kg  Vital Signs: Temp: 98.6 F (37 C) (06/15 1707) Temp Source: Oral (06/15 1707) BP: 91/71 (06/15 1707) Pulse Rate: 84 (06/15 1707)  Labs: Recent Labs    01/06/18 0636 01/07/18 0726 01/08/18 0008 01/08/18 1035 01/08/18 1814  HGB 9.2* 9.2* 9.3*  --   --   HCT 28.4* 28.9* 29.5*  --   --   PLT 508* 498* 544*  --   --   LABPROT 25.7* 19.9* 19.4*  --   --   INR 2.37 1.71 1.65  --   --   HEPARINUNFRC  --   --  <0.10* 0.63 0.23*  CREATININE 7.66* 9.15* 9.92*  --   --     Estimated Creatinine Clearance: 8.3 mL/min (A) (by C-G formula based on SCr of 9.92 mg/dL (H)).   Assessment: 9 YOM on warfarin PTA for hx Afib and CVA - reversed with Vit K on 6/8 and Heparin bridge started 6/9 while awaiting HD access - placed. Heparin resumed post-op however warfarin has yet to resume.  A heparin level this evening resulted as SUBtherapeutic (HL 0.23 << 0.63, goal of 0.3-0.7). RN reports no interruptions with the drip and infusing appropriately. No bleeding noted. The patient has been therapeutic at lower rates before so will not be aggressive with the rate increase.   Goal of Therapy:  Heparin level 0.3-0.7 units/ml Monitor platelets by anticoagulation protocol: Yes   Plan:  - Increase Heparin to 1700 units/hr (17 ml/hr) - MD to advise on when warfarin should be resumed - Will continue to monitor for any signs/symptoms of bleeding and will follow up with heparin level in 8 hours   Thank you for allowing pharmacy to be a part of this patient's care.  Alycia Rossetti, PharmD, BCPS Clinical Pharmacist Pager: [phone] Clinical phone for 01/08/2018 from 7a-3:30p: [phone] If after 3:30p, please call main pharmacy at: [phone] 01/08/2018 7:52 PM

## 2018-01-09 ENCOUNTER — Inpatient Hospital Stay (HOSPITAL_COMMUNITY): Payer: Medicare Other

## 2018-01-09 DIAGNOSIS — D72829 Elevated white blood cell count, unspecified: Secondary | ICD-10-CM

## 2018-01-09 LAB — CBC
HCT: 28.6 % — ABNORMAL LOW (ref 39.0–52.0)
Hemoglobin: 9 g/dL — ABNORMAL LOW (ref 13.0–17.0)
MCH: 27.3 pg (ref 26.0–34.0)
MCHC: 31.5 g/dL (ref 30.0–36.0)
MCV: 86.7 fL (ref 78.0–100.0)
PLATELETS: 499 10*3/uL — AB (ref 150–400)
RBC: 3.3 MIL/uL — AB (ref 4.22–5.81)
RDW: 16.4 % — ABNORMAL HIGH (ref 11.5–15.5)
WBC: 14.4 10*3/uL — AB (ref 4.0–10.5)

## 2018-01-09 LAB — BASIC METABOLIC PANEL
ANION GAP: 12 (ref 5–15)
BUN: 64 mg/dL — ABNORMAL HIGH (ref 6–20)
CALCIUM: 8.9 mg/dL (ref 8.9–10.3)
CHLORIDE: 98 mmol/L — AB (ref 101–111)
CO2: 26 mmol/L (ref 22–32)
Creatinine, Ser: 7.25 mg/dL — ABNORMAL HIGH (ref 0.61–1.24)
GFR calc non Af Amer: 7 mL/min — ABNORMAL LOW (ref >60)
GFR, EST AFRICAN AMERICAN: 8 mL/min — AB (ref >60)
Glucose, Bld: 169 mg/dL — ABNORMAL HIGH (ref 65–99)
Potassium: 3.9 mmol/L (ref 3.5–5.1)
Sodium: 136 mmol/L (ref 135–145)

## 2018-01-09 LAB — HEPARIN LEVEL (UNFRACTIONATED)
Heparin Unfractionated: 0.32 IU/mL (ref 0.30–0.70)
Heparin Unfractionated: 0.34 IU/mL (ref 0.30–0.70)

## 2018-01-09 LAB — GLUCOSE, CAPILLARY
GLUCOSE-CAPILLARY: 176 mg/dL — AB (ref 65–99)
GLUCOSE-CAPILLARY: 184 mg/dL — AB (ref 65–99)
GLUCOSE-CAPILLARY: 187 mg/dL — AB (ref 65–99)
GLUCOSE-CAPILLARY: 204 mg/dL — AB (ref 65–99)
Glucose-Capillary: 144 mg/dL — ABNORMAL HIGH (ref 65–99)
Glucose-Capillary: 174 mg/dL — ABNORMAL HIGH (ref 65–99)

## 2018-01-09 LAB — MAGNESIUM: Magnesium: 2.2 mg/dL (ref 1.7–2.4)

## 2018-01-09 LAB — LACTIC ACID, PLASMA: LACTIC ACID, VENOUS: 1.5 mmol/L (ref 0.5–1.9)

## 2018-01-09 LAB — PROTIME-INR
INR: 1.41
PROTHROMBIN TIME: 17.1 s — AB (ref 11.4–15.2)

## 2018-01-09 MED ORDER — WARFARIN SODIUM 5 MG PO TABS
5.0000 mg | ORAL_TABLET | Freq: Once | ORAL | Status: AC
  Administered 2018-01-09: 5 mg via ORAL
  Filled 2018-01-09: qty 1

## 2018-01-09 MED ORDER — WARFARIN - PHARMACIST DOSING INPATIENT
Freq: Every day | Status: DC
  Administered 2018-01-10: 17:00:00

## 2018-01-09 MED ORDER — PIPERACILLIN-TAZOBACTAM 3.375 G IVPB
3.3750 g | Freq: Two times a day (BID) | INTRAVENOUS | Status: DC
  Administered 2018-01-09 – 2018-01-10 (×3): 3.375 g via INTRAVENOUS
  Filled 2018-01-09 (×5): qty 50

## 2018-01-09 MED ORDER — ACETAMINOPHEN 160 MG/5ML PO SOLN
650.0000 mg | Freq: Four times a day (QID) | ORAL | Status: DC | PRN
  Administered 2018-01-10 – 2018-01-11 (×2): 650 mg
  Filled 2018-01-09 (×2): qty 20.3

## 2018-01-09 MED ORDER — CHLORHEXIDINE GLUCONATE CLOTH 2 % EX PADS
6.0000 | MEDICATED_PAD | Freq: Every day | CUTANEOUS | Status: DC
  Administered 2018-01-09 – 2018-01-10 (×2): 6 via TOPICAL

## 2018-01-09 NOTE — Progress Notes (Signed)
ANTICOAGULATION CONSULT NOTE   Pharmacy Consult for Heparin and Coumadin Indication: PAF with h/o CVA  Patient Measurements: Height: 5\' 10"  (177.8 cm) Weight: 202 lb 2.6 oz (91.7 kg) IBW/kg (Calculated) : 73 Heparin Dosing Weight: 90 kg  Vital Signs: Temp: 99.8 F (37.7 C) (06/16 0822) Temp Source: Axillary (06/16 0822) BP: 125/78 (06/16 1155) Pulse Rate: 76 (06/16 1155)  Labs: Recent Labs    01/07/18 0726 01/08/18 0008  01/08/18 1814 01/09/18 0500 01/09/18 1210  HGB 9.2* 9.3*  --   --  9.0*  --   HCT 28.9* 29.5*  --   --  28.6*  --   PLT 498* 544*  --   --  499*  --   LABPROT 19.9* 19.4*  --   --  17.1*  --   INR 1.71 1.65  --   --  1.41  --   HEPARINUNFRC  --  <0.10*   < > 0.23* 0.32 0.34  CREATININE 9.15* 9.92*  --   --  7.25*  --    < > = values in this interval not displayed.   Estimated Creatinine Clearance: 11.4 mL/min (A) (by C-G formula based on SCr of 7.25 mg/dL (H)).  Assessment: 74 YOM on warfarin PTA for hx Afib and CVA - reversed with Vit K on 6/8 and Heparin bridge started 6/9 while awaiting HD access - placed 6/14. Heparin resumed 6/15 and coumadin to resume 6/16, today.  Heparin level this morning therapeutic and afternoon therapeutic, 0.34. No bleeding noted  CBC is stable   Goal of Therapy:  Heparin level 0.3-0.7 units/ml Monitor platelets by anticoagulation protocol: Yes   Plan:  - Continue Heparin to 1700 units/hr (17 ml/hr) - Start Coumadin 5 mg PO x1 tonight - Daily heparin level and INR, CBC - Will continue to monitor for any signs/symptoms of bleeding   Chealsey Miyamoto L. Kyung Rudd, PharmD, [place] PGY1 Pharmacy Resident

## 2018-01-09 NOTE — Progress Notes (Signed)
Subjective: Interval History: has no complaint, confused, perseverating.  Objective: Vital signs in last 24 hours: Temp:  [97.9 F (36.6 C)-101.3 F (38.5 C)] 99.8 F (37.7 C) (06/16 0822) Pulse Rate:  [82-90] 82 (06/16 0822) Resp:  [14-34] 27 (06/16 0822) BP: (91-120)/(68-78) 120/77 (06/16 0822) SpO2:  [100 %] 100 % (06/16 0822) Weight:  [91.7 kg (202 lb 2.6 oz)] 91.7 kg (202 lb 2.6 oz) (06/16 0202) Weight change: -1.3 kg (-2 lb 13.9 oz)  Intake/Output from previous day: 06/15 0701 - 06/16 0700 In: 684 [I.V.:204; NG/GT:480] Out: 2527  Intake/Output this shift: No intake/output data recorded.  General appearance: moderately obese and confused, Ox1 , perseverating Resp: diminished breath sounds bilaterally Cardio: S1, S2 normal and systolic murmur: systolic ejection 2/6, decrescendo at 2nd left intercostal space GI: obese, mild diffuse tender,  Extremities: R groin cath,  bilat amp bandage L stump, erosion L thigh  PEG  Mid epigastrium  Lab Results: Recent Labs    01/08/18 0008 01/09/18 0500  WBC 10.0 14.4*  HGB 9.3* 9.0*  HCT 29.5* 28.6*  PLT 544* 499*   BMET:  Recent Labs    01/08/18 0008 01/09/18 0500  NA 136 136  K 4.7 3.9  CL 97* 98*  CO2 26 26  GLUCOSE 188* 169*  BUN 100* 64*  CREATININE 9.92* 7.25*  CALCIUM 9.1 8.9   No results for input(s): PTH in the last 72 hours. Iron Studies: No results for input(s): IRON, TIBC, TRANSFERRIN, FERRITIN in the last 72 hours.  Studies/Results: Dg Chest Port 1 View  Result Date: 01/07/2018 CLINICAL DATA:  Dialysis catheter placement EXAM: PORTABLE CHEST 1 VIEW COMPARISON:  January 02, 2018 FINDINGS: The right femoral dialysis catheter extends through the right atrium into the SVC. Recommend withdrawing approximately 6 cm. The heart, hila, mediastinum, lungs, and pleura are otherwise unchanged and unremarkable. IMPRESSION: The right femoral dialysis catheter extends through the right atrium into the SVC. Recommend  withdrawing approximately 6 cm. These results will be called to the ordering clinician or representative by the Radiologist Assistant, and communication documented in the PACS or zVision Dashboard. Electronically Signed   By: Dorise Bullion III M.D   On: 01/07/2018 16:41   Dg Cyndy Freeze Guide Cv Line-no Report  Result Date: 01/07/2018 Fluoroscopy was utilized by the requesting physician.  No radiographic interpretation.    I have reviewed the patient's current medications.  Assessment/Plan: 1 ESRD will do HD tomorrow.  Vol xs  2 Anemia esa 3 HPTH 4 Fevers on going despite AB  ?? Source 5 DM controlled 6 Dementia  7 AMS 8 Obesity 9 PVD 10 Severe debill P Hd, AB, ?? reculture.     LOS: 10 days   Jeneen Rinks Caige Almeda 01/09/2018,9:57 AM

## 2018-01-09 NOTE — Progress Notes (Signed)
ANTICOAGULATION CONSULT NOTE   Pharmacy Consult for Heparin Indication: PAF with h/o CVA  Patient Measurements: Height: 5\' 10"  (177.8 cm) Weight: 202 lb 2.6 oz (91.7 kg) IBW/kg (Calculated) : 73 Heparin Dosing Weight: 90 kg  Vital Signs: Temp: 99 F (37.2 C) (06/16 0434) Temp Source: Oral (06/16 0434) BP: 110/76 (06/16 0106)  Labs: Recent Labs    01/06/18 0636 01/07/18 0726  01/08/18 0008 01/08/18 1035 01/08/18 1814 01/09/18 0500  HGB 9.2* 9.2*  --  9.3*  --   --  9.0*  HCT 28.4* 28.9*  --  29.5*  --   --  28.6*  PLT 508* 498*  --  544*  --   --  499*  LABPROT 25.7* 19.9*  --  19.4*  --   --  17.1*  INR 2.37 1.71  --  1.65  --   --  1.41  HEPARINUNFRC  --   --    < > <0.10* 0.63 0.23* 0.32  CREATININE 7.66* 9.15*  --  9.92*  --   --   --    < > = values in this interval not displayed.    Estimated Creatinine Clearance: 8.3 mL/min (A) (by C-G formula based on SCr of 9.92 mg/dL (H)).   Assessment: 77 YOM on warfarin PTA for hx Afib and CVA - reversed with Vit K on 6/8 and Heparin bridge started 6/9 while awaiting HD access - placed. Heparin resumed post-op however warfarin has yet to resume.  Heparin level this morning therapeutic  Goal of Therapy:  Heparin level 0.3-0.7 units/ml Monitor platelets by anticoagulation protocol: Yes   Plan:  - Continue Heparin to 1700 units/hr (17 ml/hr) - MD to advise on when warfarin should be resumed - Will continue to monitor for any signs/symptoms of bleeding and will follow up with heparin level in 8 hours   Thank you for allowing pharmacy to be a part of this patient's care. Excell Seltzer, PharmD Clinical Pharmacist

## 2018-01-09 NOTE — Progress Notes (Deleted)
[facility] Progress Note   Subjective: Opens eyes to verbal stimuli-does not track or accommodate, continues mumbling, "uh, huh, uh huh".   Objective Vitals:   01/09/18 0202 01/09/18 0434 01/09/18 0506 01/09/18 0822  BP:   108/74 120/77  Pulse:    82  Resp:   14 (!) 27  Temp:  99 F (37.2 C)  99.8 F (37.7 C)  TempSrc:  Oral  Axillary  SpO2:    100%  Weight: 91.7 kg (202 lb 2.6 oz)     Height:       Physical Exam General: obese, chronically ill appearing male in NAD. Does not answer any questions or follow commands.  Heart: M3,N3 2/6 systolic M Lungs: Decreased in bases bilaterally otherwise CTAB A/P Abdomen: Active BS, non-distended.  Extremities: bilateral BKA. Drsg L stump. Multiple ulcers more on L than R.  Dialysis Access: R fem TDC drsg intact.  Additional Objective Labs: Basic Metabolic Panel: Recent Labs  Lab 01/05/18 0820 01/06/18 0636 01/07/18 0726 01/08/18 0008 01/08/18 0459 01/09/18 0500  NA 138 137 135 136  --  136  K 3.9 4.2 4.6 4.7  --  3.9  CL 99* 99* 99* 97*  --  98*  CO2 26 26 23 26   --  26  GLUCOSE 135* 152* 133* 188*  --  169*  BUN 51* 69* 92* 100*  --  64*  CREATININE 6.15* 7.66* 9.15* 9.92*  --  7.25*  CALCIUM 9.0 8.9 9.0 9.1  --  8.9  PHOS 1.0* 2.4*  --   --  3.5  --    Liver Function Tests: Recent Labs  Lab 01/03/18 0720 01/05/18 0820  ALBUMIN 2.1* 2.1*   No results for input(s): LIPASE, AMYLASE in the last 168 hours. CBC: Recent Labs  Lab 01/05/18 0820 01/06/18 0636 01/07/18 0726 01/08/18 0008 01/09/18 0500  WBC 11.1* 10.1 10.3 10.0 14.4*  HGB 8.8* 9.2* 9.2* 9.3* 9.0*  HCT 28.1* 28.4* 28.9* 29.5* 28.6*  MCV 88.4 85.8 87.8 88.1 86.7  PLT 422* 508* 498* 544* 499*   Blood Culture    Component Value Date/Time   SDES BLOOD RIGHT HAND 01/03/2018 1352   SDES BLOOD RIGHT HAND 01/03/2018 1352   SPECREQUEST  01/03/2018 1352    BOTTLES DRAWN AEROBIC ONLY Blood Culture adequate volume   SPECREQUEST  01/03/2018  1352    BOTTLES DRAWN AEROBIC ONLY Blood Culture results may not be optimal due to an inadequate volume of blood received in culture bottles   CULT  01/03/2018 1352    NO GROWTH 5 DAYS Performed at [facility], [place] [address], [place], [place] [address]    CULT  01/03/2018 1352    NO GROWTH 5 DAYS Performed at [facility], [place] [address] [place],  [address]    REPTSTATUS 01/08/2018 FINAL 01/03/2018 1352   REPTSTATUS 01/08/2018 FINAL 01/03/2018 1352    Cardiac Enzymes: No results for input(s): CKTOTAL, CKMB, CKMBINDEX, TROPONINI in the last 168 hours. CBG: Recent Labs  Lab 01/08/18 1813 01/08/18 2018 01/09/18 0035 01/09/18 0430 01/09/18 0758  GLUCAP 201* 181* 204* 144* 184*   Iron Studies: No results for input(s): IRON, TIBC, TRANSFERRIN, FERRITIN in the last 72 hours. @lablastinr3 @ Studies/Results: Dg Chest Port 1 View  Result Date: 01/07/2018 CLINICAL DATA:  Dialysis catheter placement EXAM: PORTABLE CHEST 1 VIEW COMPARISON:  January 02, 2018 FINDINGS: The right femoral dialysis catheter extends through the right atrium into the SVC. Recommend withdrawing approximately 6 cm.  The heart, hila, mediastinum, lungs, and pleura are otherwise unchanged and unremarkable. IMPRESSION: The right femoral dialysis catheter extends through the right atrium into the SVC. Recommend withdrawing approximately 6 cm. These results will be called to the ordering clinician or representative by the Radiologist Assistant, and communication documented in the PACS or zVision Dashboard. Electronically Signed   By: Dorise Bullion III M.D   On: 01/07/2018 16:41   Dg Cyndy Freeze Guide Cv Line-no Report  Result Date: 01/07/2018 Fluoroscopy was utilized by the requesting physician.  No radiographic interpretation.   Medications: . sodium chloride Stopped (01/02/18 1800)  . sodium chloride 10 mL/hr at 01/05/18 0907  . sodium chloride    . sodium chloride    . sodium chloride 10 mL/hr at  01/07/18 1900  . heparin 1,700 Units/hr (01/09/18 0124)   . amiodarone  200 mg Per Tube Daily  . atorvastatin  40 mg Per Tube QHS  . Chlorhexidine Gluconate Cloth  6 each Topical Q0600  . Chlorhexidine Gluconate Cloth  6 each Topical Q0600  . [START ON 01/11/2018] darbepoetin (ARANESP) injection - DIALYSIS  150 mcg Intravenous Q Tue-HD  . escitalopram  10 mg Per Tube Daily  . famotidine  20 mg Oral QHS  . feeding supplement (NEPRO CARB STEADY)  1,000 mL Per Tube Q24H  . feeding supplement (PRO-STAT SUGAR FREE 64)  30 mL Per Tube TID  . insulin aspart  0-9 Units Subcutaneous Q4H  . latanoprost  1 drop Both Eyes QHS  . mouth rinse  15 mL Mouth Rinse BID  . metoprolol tartrate  12.5 mg Per Tube BID  . multivitamin  1 tablet Oral QHS     D[facility]  4h 88kg 2/2 bath p4 L TDC heparin 4000 then 2000 midrun -venofer 100 mg x 10 to start 6/8  Assessment/Plan: 1Fevers/ sepsis - no clear etiology. 1/2 BC positive for CNS. Possible catheter infection- L TDC removed 01/03/18 per Dr. Donzetta Matters. New TDC placed 01/07/18 per Dr. Donzetta Matters . Tmax 99.3 WBC 10.0 Vanc per pharmacy. Per primary. Concern for osteo D/T discoloration/multiple ulcerations L BKA. MRI tibia/fibula negative for osteo 01/06/18.  2Confusion - Still confused. Says "Un Huh" to every question. Not following commands.Cannot see any improvement or change in mental status. He is no longer a candidate for OP HD. Will require LTACH on DC.  3 ESRD - usual HD TTS. K+ 3.9. Did not get HD yesterday D/T scheduling issues. HD today.  4 Hypotension/volume- BP stable with SBP trending in 150s. On midodrine. Not sure he needs this. Will see how he tolerates HD today.  HD today post [facility] placement. About 2 kgs over OP EDW. Attempt to get to OP EDW.  5 Anemia ckd - HGB 9.0 01/09/18. HGB trending up.  Rec'd60 Aranesp given 6/10. FOBT positive 01/04/18. ^ Aranesp to 150 mcg IV weekly 6 mbd ckd - Ca 9.0 C Ca 10.4 01/07/18  Ca 8.9  today. Phos 2.4 01/06/18 Binders on hold. 2.0 Ca bath. No VDRA. Lower Ca with HD 7 Afib -Has been started on heparin gtt with pharmacy managing.  8 DM2-per primary.  9 FTT - failure to thrive. Patient remains a full code.  10. Nutrition - nepro at 40 per PEG/prostat alb 2.1  11. Stage 2 Decub - Having liquid stools. Has flexaseal. 11Obesity   Hannibal Skalla H. Caz Weaver NP-C 01/09/2018, 10:01 AM  [employer] [phone]

## 2018-01-09 NOTE — Clinical Social Work Note (Signed)
Clinical Social Work Assessment  Patient Details  Name: Oscar Castillo MRN: [id number] Date of Birth: [birth year]  Date of referral:  01/06/18               Reason for consult:  Discharge Planning                Permission sought to share information with:  Facility Sport and exercise psychologist, Family Supports Permission granted to share information::  No  Name::     Education officer, museum::  [place]  Relationship::  Spouse  Contact Information:  [phone]  Housing/Transportation Living arrangements for the past 2 months:  [place] of Information:  Spouse Patient Interpreter Needed:  None Criminal Activity/Legal Involvement Pertinent to Current Situation/Hospitalization:  No - Comment as needed Significant Relationships:  Spouse Lives with:  Facility Resident Do you feel safe going back to the place where you live?  Yes Need for family participation in patient care:  Yes (Comment)  Care giving concerns: CSW received a  consult regarding SNF placement.  CSW spoke with pt's spouse.  Pt was at [facility] for 1 week before returning to hospital with an infection.  Pt's spouse would like for pt to return home if able to do so at discharge.  Pt's spouse is agreeable for SNF placement.   Social Worker assessment / plan:  CSW spoke with pt's spouse regarding SNF placement.  Pt's spouse are agreeable for placement.  Employment status:  Retired Forensic scientist:  Medicare PT Recommendations:  Not assessed at this time Information / Referral to community resources:  [place]  Patient/Family's Response to care:  Pt's spouse is concern with pt's infection source and would like further assessments on pt.  Pt's spouse reports agreement with discharge plan.  Patient/Family's Understanding of and Emotional Response to Diagnosis, Current Treatment, and Prognosis:  Pt's spouse expressed being hopeful of pt returning home but understand pt may need SNF placement.   Pt's spouse expressed understanding of CSW role and discharge process as well as medical condition.  No questions or concerns about plan or treatment.  Emotional Assessment Appearance:  Appears older than stated age Attitude/Demeanor/Rapport:  Unable to Assess Affect (typically observed):  Unable to Assess Orientation:  Oriented to Self Alcohol / Substance use:  Not Applicable Psych involvement (Current and /or in the community):  No (Comment)  Discharge Needs  Concerns to be addressed:  Care Coordination Readmission within the last 30 days:  No Current discharge risk:  Chronically ill Barriers to Discharge:  Continued Medical Work up   [employer], [place] 01/09/2018, 4:04 PM

## 2018-01-09 NOTE — Progress Notes (Signed)
Elevated temp to 101.2, administered tylenol 650mg . F/u temp at 1723 is 100. Notified Dr. Sherral Hammers MD.

## 2018-01-09 NOTE — Progress Notes (Signed)
PROGRESS NOTE    Oscar Castillo  FAO:[id number] DOB: Apr 20, 1951 DOA: 12/30/2017 PCP: [name], MD   Brief Narrative:  67 year old BM PMHx Bipolar disorder, [place] 2009, Chronic Encephalopathy, CHOLELITHIASIS, Chronic combined systolic and diastolic CHF, HTN, LBBB,PAF (paroxysmal atrial fibrillation). DIABETES MELLITUS, TYPE II uncontrolled with complication, ESRD on HD, GI bleed, Hepatitis C, Hx Costridium difficile, Hx Nephrolithiasis  Morbid obesity. Bedbound, bilateral BKA,S/P PEG tube placement.   Ongoing encephalopathy for the last few months, multiple recent admissions in the last 3 months, who was discharged from this hospital a month ago, stayed in [place] for a few weeks thereafter was discharged to SNF from where he was brought again to [facility] on 12/30/2017 for sepsis and encephalopathy, his encephalopathy seems to be acute on chronic after discussing with wife it appears like he has been confused more or less for the last 2 months.   He was seen and treated under pulmonary critical care from 12/30/2017 to 01/02/2018, has been transferred under my service on 01/02/2018.  Still encephalopathic.   Meantime blood cultures 1 out of 2 positive for coag negative staph, he did have extremely elevated procalcitonin with the left IJ tunneled dialysis catheter, case was discussed with ID and left IJ dialysis catheter was removed on 01/03/2018 plan is to place a temporary catheter on 01/05/2018 by vascular surgery for dialysis, give him a total of 10 days of IV antibiotics thereafter place a new tunneled catheter.  Overall extremely poor quality of life and still signs of sepsis, wife adamant to continue aggressive measures.      Subjective: 6/16 last 24 hours T-max 38.5 C. A/O 0. Does not follow commands nonverbal today.     Assessment & Plan:   Active Problems:   Type 2 diabetes mellitus with chronic kidney disease on chronic dialysis, with long-term current use of  insulin (HCC)   Essential (primary) hypertension   ESRD on hemodialysis (HCC)   S/P BKA (below knee amputation) bilateral (HCC)   Paroxysmal atrial fibrillation (HCC)   Anemia due to stage 5 chronic kidney disease ([place])   DM (diabetes mellitus) with peripheral vascular complication (HCC)   Severe sepsis (HCC)  Severe sepsis - 6/15 spike fever. Patient currently on antibiotics -blood cultures pending, PCXR pending - New Leukocytosis. True infection vs Demargination from placement of temporary HD cath -lactic acid pending - previously source of infection untraceablel. Patient empirically star HD cath removed, temporary HD cath placed. LEFT BKA MRI negative for osteomyelitis -Lactic acid normal, currently afebrile.  Will not broaden antibiotic coverage at this time.  If patient continues to spike fever or continues any increasing leukocytosis will broaden antibiotic empirically.     Acute on Chronic Encephalopathy - Patient had EXTREMELY poor prognosis, has been deteriorating over the last several months. - Per Dr. Gildardo Cranker note discussed case with patient's wife on 6/11 and she continues to have extremely poor understanding of patient's current long-term prognosis.  Continue aggressive measures for now. - Patient's Encephalopathy has been deteriorating over several months although patient has been receiving maximal treatment for his multiple medical conditions without improvement therefore unlikely to improve. -6/16 no improvement  Chronic systolic and diastolic CHF - EF= 69%, down from 50% in 2015  -Strict in and out since admission +5.6 L -Daily weight Filed Weights   01/08/18 0347 01/08/18 0805 01/09/18 0202  Weight: 206 lb 12.7 oz (93.8 kg) 198 lb 3.1 oz (89.9 kg) 202 lb 2.6 oz (91.7 kg)  - Transfuse  for hemoglobin<8 -Amiodarone 200 mg daily -Metoprolol 12.5 mg BID - Patient with elevated HT, Midodrine 5 mg BID (hold) - Repeat echocardiogram in 1 to 2 months if patient survives  this hospitalization.  Paroxysmal atrial fibrillation -See CHF -Currently NSR -Coumadin(Hold).  Awaiting procedure on 6/13 -6/15 restarted heparin per pharmacy -6/16 restart Coumadin per pharmacy   Dysphasia - N.p.o. secondary to acute on chronic encephalopathy.  -Continue PEG tube feeds  - Unable to elevate bed> 30 degrees secondary to patient's femoral HD Vas-Cath  Enteritis -on CT scan.  C. difficile negative, GI pathogen panel negative.  Likely diarrhea due to osmolar diuresis from tube feeds.  Continue to monitor.  ESRD on HD T/Th/Sat  -HD per nephrology -6/14 S/P placement RIGHT tunnel Femoral HD cath.  Radiology called and recommended cath be pulled back 4 cm however was reevaluated by Dr. Donzetta Matters vascular surgery who states catheter  appropriate position and ready for HD   Bilateral BKA/stage II sacral decubitus ulcer -Cleanse wounds to bilateral lower legs with soap and water and pat dry. SIlicone foam dressing to pad and protect. Change every three days and PRN soilage.   LEFT LOWER extremity lacerations - Patient with multiple LLE lacerations on his BKA stump in various stages of healing tender to palpation. - See sacral decubitus ulcer  Diabetes type 2 controlled with complication - 5/57 hemoglobin A1c= 6.8 -Sensitive SSI  HLD -Lipid panel within ADA guidelines. -Lipitor 40 mg daily      DVT prophylaxis: Heparin--> Coumadin   Code Status: Full Family Communication: None Disposition Plan: Once cleared by nephrology SNF vs LTACH   Consultants:  PCCM Nephrology ID phone consult Vascular surgery WOC nurse   Procedures/Significant Events:  6/13 MRI LEFT tibia/fibula:Below the knee amputation of left lower leg. No osteomyelitis of stump of the left tibia and fibula. 6/14 RIGHT Femoral tunneled HD cath 6/14 PCXR:right femoral dialysis catheter extends through the right atrium into the SVC. Recommend withdrawing approximately 6 cm     I have personally  reviewed and interpreted all radiology studies and my findings are as above.  VENTILATOR SETTINGS:    Cultures 6/6 blood 1/2+ coag negative staph most likely contaminant 6/9 blood negative 6/10 blood negative 6/12 MRSA by PCR negative 6/16 blood pending      Antimicrobials: Anti-infectives (From admission, onward)   Start     Stop   01/06/18 1200  vancomycin (VANCOCIN) IVPB 1000 mg/200 mL premix         01/04/18 1200  vancomycin (VANCOCIN) IVPB 1000 mg/200 mL premix  Status:  Discontinued     01/03/18 1029   01/04/18 0915  vancomycin (VANCOCIN) 500 mg in sodium chloride 0.9 % 100 mL IVPB     01/04/18 1232   01/03/18 1200  vancomycin (VANCOCIN) IVPB 1000 mg/200 mL premix     01/03/18 1131   01/01/18 1200  vancomycin (VANCOCIN) IVPB 1000 mg/200 mL premix     01/02/18 0000   12/30/17 2200  piperacillin-tazobactam (ZOSYN) IVPB 3.375 g  Status:  Discontinued     01/03/18 1611   12/30/17 1315  vancomycin (VANCOCIN) 2,000 mg in sodium chloride 0.9 % 500 mL IVPB     12/30/17 1517   12/30/17 1230  vancomycin (VANCOCIN) IVPB 1000 mg/200 mL premix  Status:  Discontinued     12/30/17 1229   12/30/17 1230  piperacillin-tazobactam (ZOSYN) IVPB 3.375 g     12/30/17 1314       Devices    LINES / TUBES:  Continuous Infusions: . sodium chloride Stopped (01/02/18 1800)  . sodium chloride 10 mL/hr at 01/05/18 0907  . sodium chloride    . sodium chloride    . sodium chloride 10 mL/hr at 01/07/18 1900  . heparin 1,700 Units/hr (01/09/18 0124)     Objective: Vitals:   01/09/18 0202 01/09/18 0434 01/09/18 0506 01/09/18 0822  BP:   108/74 120/77  Pulse:    82  Resp:   14 (!) 27  Temp:  99 F (37.2 C)  99.8 F (37.7 C)  TempSrc:  Oral  Axillary  SpO2:    100%  Weight: 202 lb 2.6 oz (91.7 kg)     Height:        Intake/Output Summary (Last 24 hours) at 01/09/2018 0828 Last data filed at 01/09/2018 0541 Gross per 24 hour  Intake 684 ml  Output -  Net 684 ml    Filed Weights   01/08/18 0347 01/08/18 0805 01/09/18 0202  Weight: 206 lb 12.7 oz (93.8 kg) 198 lb 3.1 oz (89.9 kg) 202 lb 2.6 oz (91.7 kg)     Physical Exam:  General: A/O 0, nonverbal today,No acute respiratory distress Neck:  Negative scars, masses, torticollis, lymphadenopathy, JVD Lungs: Clear to auscultation bilaterally without wheezes or crackles Cardiovascular: Regular rate and rhythm without murmur gallop or rub normal S1 and S2 Abdomen: negative abdominal pain, nondistended, positive soft, bowel sounds, no rebound, no ascites, no appreciable mass, PEG tube in place negative sign of infe Extremities:bilateral BKA, LEFT BK A multiple lacerations in various stages of healing. Skin: stage II sacral decubitus ulcer, left che post HD catheter removal clean dry negative discharge. RIGHT femoral catheter present negative sign of infection or bleeding Psychiatric:unable to evaluate secondary to acute on chronic encephalopathy  Central nervous system:  Moves all extremities spontaneously,unable to evaluate secondary to acute on chronic encephalopathy      Data Reviewed: Care during the described time interval was provided by me .  I have reviewed this patient's available data, including medical history, events of note, physical examination, and all test results as part of my evaluation.   CBC: Recent Labs  Lab 01/05/18 0820 01/06/18 0636 01/07/18 0726 01/08/18 0008 01/09/18 0500  WBC 11.1* 10.1 10.3 10.0 14.4*  HGB 8.8* 9.2* 9.2* 9.3* 9.0*  HCT 28.1* 28.4* 28.9* 29.5* 28.6*  MCV 88.4 85.8 87.8 88.1 86.7  PLT 422* 508* 498* 544* 518*   Basic Metabolic Panel: Recent Labs  Lab 01/03/18 0720 01/05/18 0820 01/06/18 0636 01/07/18 0726 01/08/18 0008 01/08/18 0459 01/09/18 0500  NA 136 138 137 135 136  --  136  K 3.3* 3.9 4.2 4.6 4.7  --  3.9  CL 97* 99* 99* 99* 97*  --  98*  CO2 28 26 26 23 26   --  26  GLUCOSE 191* 135* 152* 133* 188*  --  169*  BUN 66* 51* 69* 92*  100*  --  64*  CREATININE 6.92* 6.15* 7.66* 9.15* 9.92*  --  7.25*  CALCIUM 8.7* 9.0 8.9 9.0 9.1  --  8.9  MG  --   --  2.2 2.2 2.5*  --  2.2  PHOS 1.0* 1.0* 2.4*  --   --  3.5  --    GFR: Estimated Creatinine Clearance: 11.4 mL/min (A) (by C-G formula based on SCr of 7.25 mg/dL (H)). Liver Function Tests: Recent Labs  Lab 01/03/18 0720 01/05/18 0820  ALBUMIN 2.1* 2.1*   No results for input(s): LIPASE, AMYLASE in  the last 168 hours. Recent Labs  Lab 01/08/18 0459  AMMONIA 23   Coagulation Profile: Recent Labs  Lab 01/05/18 0820 01/06/18 0636 01/07/18 0726 01/08/18 0008 01/09/18 0500  INR 2.23 2.37 1.71 1.65 1.41   Cardiac Enzymes: No results for input(s): CKTOTAL, CKMB, CKMBINDEX, TROPONINI in the last 168 hours. BNP (last 3 results) No results for input(s): PROBNP in the last 8760 hours. HbA1C: No results for input(s): HGBA1C in the last 72 hours. CBG: Recent Labs  Lab 01/08/18 1813 01/08/18 2018 01/09/18 0035 01/09/18 0430 01/09/18 0758  GLUCAP 201* 181* 204* 144* 184*   Lipid Profile: No results for input(s): CHOL, HDL, LDLCALC, TRIG, CHOLHDL, LDLDIRECT in the last 72 hours. Thyroid Function Tests: No results for input(s): TSH, T4TOTAL, FREET4, T3FREE, THYROIDAB in the last 72 hours. Anemia Panel: No results for input(s): VITAMINB12, FOLATE, FERRITIN, TIBC, IRON, RETICCTPCT in the last 72 hours. Urine analysis:    Component Value Date/Time   COLORURINE YELLOW 05/15/2013 0007   APPEARANCEUR CLEAR 05/15/2013 0007   LABSPEC 1.014 05/15/2013 0007   PHURINE 8.5 (H) 05/15/2013 0007   GLUCOSEU 250 (A) 05/15/2013 0007   GLUCOSEU 500 (?) 11/06/2009 0948   HGBUR NEGATIVE 05/15/2013 0007   BILIRUBINUR NEGATIVE 05/15/2013 0007   KETONESUR NEGATIVE 05/15/2013 0007   PROTEINUR >300 (A) 05/15/2013 0007   UROBILINOGEN 0.2 05/15/2013 0007   NITRITE NEGATIVE 05/15/2013 0007   LEUKOCYTESUR NEGATIVE 05/15/2013 0007   Sepsis  Labs: @LABRCNTIP (procalcitonin:4,lacticidven:4)  ) Recent Results (from the past 240 hour(s))  Blood Culture (routine x 2)     Status: Abnormal   Collection Time: 12/30/17 12:54 PM  Result Value Ref Range Status   Specimen Description BLOOD SITE NOT SPECIFIED  Final   Special Requests   Final    BOTTLES DRAWN AEROBIC AND ANAEROBIC Blood Culture adequate volume   Culture  Setup Time   Final    GRAM POSITIVE COCCI IN CLUSTERS AEROBIC BOTTLE ONLY CRITICAL RESULT CALLED TO, READ BACK BY AND VERIFIED WITH: PHARMD E MARTIN [id number] MLM    Culture (A)  Final    STAPHYLOCOCCUS SPECIES (COAGULASE NEGATIVE) THE SIGNIFICANCE OF ISOLATING THIS ORGANISM FROM A SINGLE SET OF BLOOD CULTURES WHEN MULTIPLE SETS ARE DRAWN IS UNCERTAIN. PLEASE NOTIFY THE MICROBIOLOGY DEPARTMENT WITHIN ONE WEEK IF SPECIATION AND SENSITIVITIES ARE REQUIRED. Performed at [facility], [place] [address] [place], [place] [address]    Report Status 01/02/2018 FINAL  Final  Blood Culture ID Panel (Reflexed)     Status: Abnormal   Collection Time: 12/30/17 12:54 PM  Result Value Ref Range Status   Enterococcus species NOT DETECTED NOT DETECTED Final   Listeria monocytogenes NOT DETECTED NOT DETECTED Final   Staphylococcus species DETECTED (A) NOT DETECTED Final    Comment: Methicillin (oxacillin) resistant coagulase negative staphylococcus. Possible blood culture contaminant (unless isolated from more than one blood culture draw or clinical case suggests pathogenicity). No antibiotic treatment is indicated for blood  culture contaminants. CRITICAL RESULT CALLED TO, READ BACK BY AND VERIFIED WITH: PHARMD E MARTIN [id number] 0825 MLM    Staphylococcus aureus NOT DETECTED NOT DETECTED Final   Methicillin resistance DETECTED (A) NOT DETECTED Final    Comment: CRITICAL RESULT CALLED TO, READ BACK BY AND VERIFIED WITH: PHARMD E MARTIN [id number] 0825 MLM    Streptococcus species NOT DETECTED NOT DETECTED Final   Streptococcus  agalactiae NOT DETECTED NOT DETECTED Final   Streptococcus pneumoniae NOT DETECTED NOT DETECTED Final   Streptococcus pyogenes NOT DETECTED  NOT DETECTED Final   Acinetobacter baumannii NOT DETECTED NOT DETECTED Final   Enterobacteriaceae species NOT DETECTED NOT DETECTED Final   Enterobacter cloacae complex NOT DETECTED NOT DETECTED Final   Escherichia coli NOT DETECTED NOT DETECTED Final   Klebsiella oxytoca NOT DETECTED NOT DETECTED Final   Klebsiella pneumoniae NOT DETECTED NOT DETECTED Final   Proteus species NOT DETECTED NOT DETECTED Final   Serratia marcescens NOT DETECTED NOT DETECTED Final   Haemophilus influenzae NOT DETECTED NOT DETECTED Final   Neisseria meningitidis NOT DETECTED NOT DETECTED Final   Pseudomonas aeruginosa NOT DETECTED NOT DETECTED Final   Candida albicans NOT DETECTED NOT DETECTED Final   Candida glabrata NOT DETECTED NOT DETECTED Final   Candida krusei NOT DETECTED NOT DETECTED Final   Candida parapsilosis NOT DETECTED NOT DETECTED Final   Candida tropicalis NOT DETECTED NOT DETECTED Final    Comment: Performed at [facility], [place] [address] [place], [place] [address]  Blood Culture (routine x 2)     Status: None   Collection Time: 12/30/17 12:55 PM  Result Value Ref Range Status   Specimen Description BLOOD RIGHT ANTECUBITAL  Final   Special Requests   Final    BOTTLES DRAWN AEROBIC AND ANAEROBIC Blood Culture adequate volume   Culture   Final    NO GROWTH 5 DAYS Performed at [facility], [place] [address] [place], [place] [address]    Report Status 01/04/2018 FINAL  Final  MRSA PCR Screening     Status: None   Collection Time: 12/30/17  3:16 PM  Result Value Ref Range Status   MRSA by PCR NEGATIVE NEGATIVE Final    Comment:        The GeneXpert MRSA Assay (FDA approved for NASAL specimens only), is one component of a comprehensive MRSA colonization surveillance program. It is not intended to diagnose MRSA infection nor to guide  or monitor treatment for MRSA infections. Performed at [facility], [place] [address], [place], [place] [address]   C difficile quick scan w PCR reflex     Status: None   Collection Time: 01/01/18  8:09 AM  Result Value Ref Range Status   C Diff antigen NEGATIVE NEGATIVE Final   C Diff toxin NEGATIVE NEGATIVE Final   C Diff interpretation No C. difficile detected.  Final  Culture, blood (routine x 2)     Status: None   Collection Time: 01/02/18  8:43 AM  Result Value Ref Range Status   Specimen Description BLOOD RIGHT ANTECUBITAL  Final   Special Requests   Final    BOTTLES DRAWN AEROBIC AND ANAEROBIC Blood Culture adequate volume   Culture   Final    NO GROWTH 5 DAYS Performed at [facility], [address], [place], [place] [address]    Report Status 01/07/2018 FINAL  Final  Culture, blood (routine x 2)     Status: None   Collection Time: 01/02/18  8:53 AM  Result Value Ref Range Status   Specimen Description BLOOD RIGHT ANTECUBITAL  Final   Special Requests   Final    BOTTLES DRAWN AEROBIC AND ANAEROBIC Blood Culture adequate volume   Culture   Final    NO GROWTH 5 DAYS Performed at [facility], [place] [address], [place], [place] [address]    Report Status 01/07/2018 FINAL  Final  Culture, blood (routine x 2)     Status: None   Collection Time: 01/03/18  1:52  PM  Result Value Ref Range Status   Specimen Description BLOOD RIGHT HAND  Final   Special Requests   Final    BOTTLES DRAWN AEROBIC ONLY Blood Culture adequate volume   Culture   Final    NO GROWTH 5 DAYS Performed at [facility], [address] [place], [place] [address]    Report Status 01/08/2018 FINAL  Final  Culture, blood (routine x 2)     Status: None   Collection Time: 01/03/18  1:52 PM  Result Value Ref Range Status   Specimen Description BLOOD RIGHT HAND  Final   Special Requests   Final    BOTTLES DRAWN AEROBIC ONLY Blood Culture results may not be optimal due to an  inadequate volume of blood received in culture bottles   Culture   Final    NO GROWTH 5 DAYS Performed at [facility], [place] [address] [place], [place] [address]    Report Status 01/08/2018 FINAL  Final  Surgical PCR screen     Status: None   Collection Time: 01/05/18  8:30 PM  Result Value Ref Range Status   MRSA, PCR NEGATIVE NEGATIVE Final   Staphylococcus aureus NEGATIVE NEGATIVE Final    Comment: (NOTE) The Xpert SA Assay (FDA approved for NASAL specimens in patients 67 years of age and older), is one component of a comprehensive surveillance program. It is not intended to diagnose infection nor to guide or monitor treatment. Performed at [facility], [place] [address], [place], [place] [address]          Radiology Studies: Dg Chest Port 1 View  Result Date: 01/07/2018 CLINICAL DATA:  Dialysis catheter placement EXAM: PORTABLE CHEST 1 VIEW COMPARISON:  January 02, 2018 FINDINGS: The right femoral dialysis catheter extends through the right atrium into the SVC. Recommend withdrawing approximately 6 cm. The heart, hila, mediastinum, lungs, and pleura are otherwise unchanged and unremarkable. IMPRESSION: The right femoral dialysis catheter extends through the right atrium into the SVC. Recommend withdrawing approximately 6 cm. These results will be called to the ordering clinician or representative by the Radiologist Assistant, and communication documented in the PACS or zVision Dashboard. Electronically Signed   By: Dorise Bullion III M.D   On: 01/07/2018 16:41   Dg Cyndy Freeze Guide Cv Line-no Report  Result Date: 01/07/2018 Fluoroscopy was utilized by the requesting physician.  No radiographic interpretation.        Scheduled Meds: . amiodarone  200 mg Per Tube Daily  . atorvastatin  40 mg Per Tube QHS  . Chlorhexidine Gluconate Cloth  6 each Topical Q0600  . [START ON 01/11/2018] darbepoetin (ARANESP) injection - DIALYSIS  150 mcg Intravenous Q Tue-HD  .  escitalopram  10 mg Per Tube Daily  . famotidine  20 mg Oral QHS  . feeding supplement (NEPRO CARB STEADY)  1,000 mL Per Tube Q24H  . feeding supplement (PRO-STAT SUGAR FREE 64)  30 mL Per Tube TID  . insulin aspart  0-9 Units Subcutaneous Q4H  . latanoprost  1 drop Both Eyes QHS  . mouth rinse  15 mL Mouth Rinse BID  . metoprolol tartrate  12.5 mg Per Tube BID  . multivitamin  1 tablet Oral QHS   Continuous Infusions: . sodium chloride Stopped (01/02/18 1800)  . sodium chloride 10 mL/hr at 01/05/18 0907  . sodium chloride    . sodium chloride    . sodium chloride 10 mL/hr at 01/07/18 1900  . heparin  1,700 Units/hr (01/09/18 0124)     LOS: 10 days    Time spent: 40 minutes    WOODS, Geraldo Docker, MD [facility] Pager [phone]   If 7PM-7AM, please contact night-coverage [email or web address] Password [facility] 01/09/2018, 8:28 AM

## 2018-01-09 NOTE — Progress Notes (Signed)
Pharmacy Antibiotic Note  Oscar Castillo is a 67 y.o. male admitted on 12/30/2017 and currently receiving Vancomycin for MRSE bacteremia. Pharmacy has been consulted to add Zosyn due to concerns for sepsis. ESRD  Plan: - Start Zosyn 3.375g IV every 12 hours (infused over 4 hours) - Will continue to follow renal function, culture results, LOT, and antibiotic de-escalation plans   Height: 5\' 10"  (177.8 cm) Weight: 202 lb 2.6 oz (91.7 kg) IBW/kg (Calculated) : 73  Temp (24hrs), Avg:100.3 F (37.9 C), Min:99 F (37.2 C), Max:101.3 F (38.5 C)  Recent Labs  Lab 01/05/18 0820 01/06/18 0636 01/07/18 0726 01/08/18 0008 01/09/18 0500 01/09/18 1231  WBC 11.1* 10.1 10.3 10.0 14.4*  --   CREATININE 6.15* 7.66* 9.15* 9.92* 7.25*  --   LATICACIDVEN  --   --   --   --   --  1.5    Estimated Creatinine Clearance: 11.4 mL/min (A) (by C-G formula based on SCr of 7.25 mg/dL (H)).    Allergies  Allergen Reactions  . Ceftriaxone Sodium In Dextrose Anaphylaxis    Tolerates Zosyn  . Ciprofloxacin Other (See Comments)    UNSPECIFIED PAIN  . Nsaids Other (See Comments)    HISTORY OF ULCER  . Sulfa Antibiotics Swelling    SWELLING REACTION UNSPECIFIED     Antimicrobials this admission: Vanc 6/6 >> (planned 6/17) Zosyn 6/6 >> 6/10; restart 6/16 >>  Dose adjustments this admission:   Microbiology results: 6/6 MRSA PCR >> neg 6/6 BCx >> 1/2 MRSE 6/8 CDiff >> neg 6/9 BCx >> NG 6/10 BCx >> NG 6/16 BCx >>  Thank you for allowing pharmacy to be a part of this patient's care.  Alycia Rossetti, PharmD, BCPS Clinical Pharmacist 01/09/2018 6:55 PM

## 2018-01-10 DIAGNOSIS — R6520 Severe sepsis without septic shock: Secondary | ICD-10-CM

## 2018-01-10 DIAGNOSIS — N185 Chronic kidney disease, stage 5: Secondary | ICD-10-CM

## 2018-01-10 DIAGNOSIS — D631 Anemia in chronic kidney disease: Secondary | ICD-10-CM

## 2018-01-10 DIAGNOSIS — I480 Paroxysmal atrial fibrillation: Secondary | ICD-10-CM

## 2018-01-10 LAB — PROTIME-INR
INR: 1.69
PROTHROMBIN TIME: 19.7 s — AB (ref 11.4–15.2)

## 2018-01-10 LAB — CBC
HCT: 27 % — ABNORMAL LOW (ref 39.0–52.0)
Hemoglobin: 8.6 g/dL — ABNORMAL LOW (ref 13.0–17.0)
MCH: 27.6 pg (ref 26.0–34.0)
MCHC: 31.9 g/dL (ref 30.0–36.0)
MCV: 86.5 fL (ref 78.0–100.0)
PLATELETS: 498 10*3/uL — AB (ref 150–400)
RBC: 3.12 MIL/uL — ABNORMAL LOW (ref 4.22–5.81)
RDW: 16.5 % — ABNORMAL HIGH (ref 11.5–15.5)
WBC: 13.1 10*3/uL — ABNORMAL HIGH (ref 4.0–10.5)

## 2018-01-10 LAB — GLUCOSE, CAPILLARY
GLUCOSE-CAPILLARY: 165 mg/dL — AB (ref 65–99)
GLUCOSE-CAPILLARY: 176 mg/dL — AB (ref 65–99)
Glucose-Capillary: 174 mg/dL — ABNORMAL HIGH (ref 65–99)
Glucose-Capillary: 181 mg/dL — ABNORMAL HIGH (ref 65–99)
Glucose-Capillary: 186 mg/dL — ABNORMAL HIGH (ref 65–99)

## 2018-01-10 LAB — COMPREHENSIVE METABOLIC PANEL
ALT: 12 U/L — ABNORMAL LOW (ref 17–63)
AST: 13 U/L — ABNORMAL LOW (ref 15–41)
Albumin: 2.1 g/dL — ABNORMAL LOW (ref 3.5–5.0)
Alkaline Phosphatase: 53 U/L (ref 38–126)
Anion gap: 13 (ref 5–15)
BUN: 86 mg/dL — ABNORMAL HIGH (ref 6–20)
CHLORIDE: 97 mmol/L — AB (ref 101–111)
CO2: 26 mmol/L (ref 22–32)
CREATININE: 9.18 mg/dL — AB (ref 0.61–1.24)
Calcium: 9.1 mg/dL (ref 8.9–10.3)
GFR calc non Af Amer: 5 mL/min — ABNORMAL LOW (ref >60)
GFR, EST AFRICAN AMERICAN: 6 mL/min — AB (ref >60)
Glucose, Bld: 206 mg/dL — ABNORMAL HIGH (ref 65–99)
POTASSIUM: 4 mmol/L (ref 3.5–5.1)
Sodium: 136 mmol/L (ref 135–145)
TOTAL PROTEIN: 7.8 g/dL (ref 6.5–8.1)
Total Bilirubin: 0.6 mg/dL (ref 0.3–1.2)

## 2018-01-10 LAB — MAGNESIUM: Magnesium: 2.2 mg/dL (ref 1.7–2.4)

## 2018-01-10 LAB — HEPARIN LEVEL (UNFRACTIONATED): HEPARIN UNFRACTIONATED: 0.41 [IU]/mL (ref 0.30–0.70)

## 2018-01-10 MED ORDER — WARFARIN SODIUM 5 MG PO TABS
5.0000 mg | ORAL_TABLET | Freq: Once | ORAL | Status: AC
Start: 2018-01-10 — End: 2018-01-10
  Administered 2018-01-10: 5 mg via ORAL
  Filled 2018-01-10 (×2): qty 1

## 2018-01-10 MED ORDER — CHLORHEXIDINE GLUCONATE CLOTH 2 % EX PADS
6.0000 | MEDICATED_PAD | Freq: Every day | CUTANEOUS | Status: DC
  Administered 2018-01-10 – 2018-01-11 (×2): 6 via TOPICAL

## 2018-01-10 NOTE — Progress Notes (Signed)
Oscar Castillo TEAM 1 - Stepdown/ICU TEAM  Oscar Castillo  NLZ:[id number] DOB: Jul 12, 1951 DOA: 12/30/2017 PCP: [name], MD    Brief Narrative:  67yo M w/ a Hx of Bipolar disorder, CVA 2009, Chronic Encephalopathy, CHOLELITHIASIS, Chronic combined systolic and diastolic CHF, HTN, LBBB, PAF, DM2, ESRD on HD, GI bleed, Hepatitis C, Morbid obesity, Bedbound status, and B BKA w/ ongoing encephalopathy since its acute onset 11/21/17 who was discharged to [facility] from this hospital 12/02/17 and was brought back to [facility] on 12/30/2017 for sepsis and persisting encephalopathy.    Significant Events: 6/6 admit to PCCM 6/9 TRH assumed care  6/14 R tunneled Femoral HD cath  Subjective: Appears comfortable.  Non-communicative.  No evidence of resp distress or uncontrolled pain.    Assessment & Plan:  SIRS v/s Sepsis of unclear etiology No true fever since 6/16 - follow trend - currently on broad spectrum epiric abx tx   Acute on Chronic Encephalopathy Exact etiology unclear, but pt does not appear to have had any meaningful recovery since the onset of his sx 4/28 - very little chance of recovery at this late point, but his wife is reportedly unwilling to accept this   Chronic systolic and diastolic CHF TTE 9/0/24 noted EF 30-35% w/ severe hypokinesis of septal and inferior wall  Paroxysmal atrial fibrillation Rate controlled - anticoag w/ heparin > coumadin   Dysphagia secondary to encephalopathy - continue PEG tube feeds   Enteritis C. difficile negative, GI pathogen panel negative - likely diarrhea due to osmolar diuresis from tube feeds  ESRD on HD T/Th/Sat  Care per Nephrology - 6/14 S/P placement RIGHT tunneled Femoral HD cath  Bilateral BKA / bedbound / stage II sacral decubitus ulcer Care as per [place] RN suggestions   L lower extremity lacerations multiple LLE lacerations on his BKA stump in various stages of healing  DM2 CBG reasonably controlled    HLD  DVT prophylaxis: heparin > coumadin  Code Status: FULL CODE Family Communication: no family present at time of exam  Disposition Plan: SDU  Consultants:  PCCM Nephrology Vascular Surgery  Antimicrobials:  Zosyn 6/6 > Vancomycin 6/6 >  Objective: Blood pressure 97/72, pulse 84, temperature 98.2 F (36.8 C), temperature source Oral, resp. rate (!) 29, height 5\' 10"  (1.778 m), weight 88.5 kg (195 lb 1.7 oz), SpO2 100 %.  Intake/Output Summary (Last 24 hours) at 01/10/2018 1718 Last data filed at 01/10/2018 1500 Gross per 24 hour  Intake 3123.52 ml  Output 2225 ml  Net 898.52 ml   Filed Weights   01/10/18 0329 01/10/18 0730 01/10/18 1214  Weight: 90.9 kg (200 lb 6.4 oz) 90.7 kg (199 lb 15.3 oz) 88.5 kg (195 lb 1.7 oz)    Examination: General: No acute respiratory distress Lungs: Clear to auscultation bilaterally without wheezes or crackles Cardiovascular: Regular rate and rhythm without murmur gallop or rub normal S1 and S2 Abdomen: Nontender, nondistended, soft, bowel sounds positive, no rebound, no ascites, no appreciable mass Extremities: No significant edema bilateral lower extremity stumps  CBC: Recent Labs  Lab 01/08/18 0008 01/09/18 0500 01/10/18 0548  WBC 10.0 14.4* 13.1*  HGB 9.3* 9.0* 8.6*  HCT 29.5* 28.6* 27.0*  MCV 88.1 86.7 86.5  PLT 544* 499* 097*   Basic Metabolic Panel: Recent Labs  Lab 01/05/18 0820 01/06/18 0636  01/08/18 0008 01/08/18 0459 01/09/18 0500 01/10/18 0548  NA 138 137   < > 136  --  136 136  K 3.9  4.2   < > 4.7  --  3.9 4.0  CL 99* 99*   < > 97*  --  98* 97*  CO2 26 26   < > 26  --  26 26  GLUCOSE 135* 152*   < > 188*  --  169* 206*  BUN 51* 69*   < > 100*  --  64* 86*  CREATININE 6.15* 7.66*   < > 9.92*  --  7.25* 9.18*  CALCIUM 9.0 8.9   < > 9.1  --  8.9 9.1  MG  --  2.2   < > 2.5*  --  2.2 2.2  PHOS 1.0* 2.4*  --   --  3.5  --   --    < > = values in this interval not displayed.   GFR: Estimated Creatinine  Clearance: 8.9 mL/min (A) (by C-G formula based on SCr of 9.18 mg/dL (H)).  Liver Function Tests: Recent Labs  Lab 01/05/18 0820 01/10/18 0548  AST  --  13*  ALT  --  12*  ALKPHOS  --  53  BILITOT  --  0.6  PROT  --  7.8  ALBUMIN 2.1* 2.1*    Recent Labs  Lab 01/08/18 0459  AMMONIA 23    Coagulation Profile: Recent Labs  Lab 01/06/18 0636 01/07/18 0726 01/08/18 0008 01/09/18 0500 01/10/18 0548  INR 2.37 1.71 1.65 1.41 1.69    HbA1C: Hemoglobin A1C  Date/Time Value Ref Range Status  10/13/2017 11:47 AM 6.8  Final   Hgb A1c MFr Bld  Date/Time Value Ref Range Status  02/10/2017 05:17 PM 7.1 (H) 4.6 - 6.5 % Final    Comment:    Glycemic Control Guidelines for People with Diabetes:Non Diabetic:  <6%Goal of Therapy: <7%Additional Action Suggested:  >8%   09/15/2016 04:18 PM 6.4 (H) 4.8 - 5.6 % Final    Comment:    (NOTE)         Pre-diabetes: 5.7 - 6.4         Diabetes: >6.4         Glycemic control for adults with diabetes: <7.0     CBG: Recent Labs  Lab 01/09/18 2021 01/10/18 0011 01/10/18 0415 01/10/18 1303 01/10/18 1619  GLUCAP 174* 176* 174* 165* 181*    Recent Results (from the past 240 hour(s))  C difficile quick scan w PCR reflex     Status: None   Collection Time: 01/01/18  8:09 AM  Result Value Ref Range Status   C Diff antigen NEGATIVE NEGATIVE Final   C Diff toxin NEGATIVE NEGATIVE Final   C Diff interpretation No C. difficile detected.  Final  Culture, blood (routine x 2)     Status: None   Collection Time: 01/02/18  8:43 AM  Result Value Ref Range Status   Specimen Description BLOOD RIGHT ANTECUBITAL  Final   Special Requests   Final    BOTTLES DRAWN AEROBIC AND ANAEROBIC Blood Culture adequate volume   Culture   Final    NO GROWTH 5 DAYS Performed at [facility], [address], [place], [place] [address]    Report Status 01/07/2018 FINAL  Final  Culture, blood (routine x 2)     Status: None   Collection Time: 01/02/18   8:53 AM  Result Value Ref Range Status   Specimen Description BLOOD RIGHT ANTECUBITAL  Final   Special Requests   Final    BOTTLES DRAWN AEROBIC AND ANAEROBIC Blood Culture adequate  volume   Culture   Final    NO GROWTH 5 DAYS Performed at [facility], [place] [address], [place], [place] [address]    Report Status 01/07/2018 FINAL  Final  Culture, blood (routine x 2)     Status: None   Collection Time: 01/03/18  1:52 PM  Result Value Ref Range Status   Specimen Description BLOOD RIGHT HAND  Final   Special Requests   Final    BOTTLES DRAWN AEROBIC ONLY Blood Culture adequate volume   Culture   Final    NO GROWTH 5 DAYS Performed at [facility], [place] [address], [place], [place] [address]    Report Status 01/08/2018 FINAL  Final  Culture, blood (routine x 2)     Status: None   Collection Time: 01/03/18  1:52 PM  Result Value Ref Range Status   Specimen Description BLOOD RIGHT HAND  Final   Special Requests   Final    BOTTLES DRAWN AEROBIC ONLY Blood Culture results may not be optimal due to an inadequate volume of blood received in culture bottles   Culture   Final    NO GROWTH 5 DAYS Performed at [facility], [place] [address], [place], [place] [address]    Report Status 01/08/2018 FINAL  Final  Surgical PCR screen     Status: None   Collection Time: 01/05/18  8:30 PM  Result Value Ref Range Status   MRSA, PCR NEGATIVE NEGATIVE Final   Staphylococcus aureus NEGATIVE NEGATIVE Final    Comment: (NOTE) The Xpert SA Assay (FDA approved for NASAL specimens in patients 54 years of age and older), is one component of a comprehensive surveillance program. It is not intended to diagnose infection nor to guide or monitor treatment. Performed at [facility], [place] [address] [place], [place] [address]   Culture, blood (routine x 2)     Status: None (Preliminary result)   Collection Time: 01/09/18 12:38 PM  Result Value Ref Range Status   Specimen Description  BLOOD LEFT HAND  Final   Special Requests   Final    BOTTLES DRAWN AEROBIC AND ANAEROBIC Blood Culture adequate volume   Culture   Final    NO GROWTH < 24 HOURS Performed at [facility], [place] [address], [place], [place] [address]    Report Status PENDING  Incomplete  Culture, blood (routine x 2)     Status: None (Preliminary result)   Collection Time: 01/09/18 12:49 PM  Result Value Ref Range Status   Specimen Description BLOOD LEFT WRIST  Final   Special Requests   Final    BOTTLES DRAWN AEROBIC AND ANAEROBIC Blood Culture adequate volume   Culture   Final    NO GROWTH < 24 HOURS Performed at [facility], [place] [address], [place], [place] [address]    Report Status PENDING  Incomplete     Scheduled Meds: . amiodarone  200 mg Per Tube Daily  . atorvastatin  40 mg Per Tube QHS  . Chlorhexidine Gluconate Cloth  6 each Topical Q0600  . [START ON 01/11/2018] darbepoetin (ARANESP) injection - DIALYSIS  150 mcg Intravenous Q Tue-HD  . escitalopram  10 mg Per Tube Daily  . famotidine  20 mg Oral QHS  . feeding supplement (NEPRO CARB STEADY)  1,000 mL Per Tube Q24H  . feeding supplement (PRO-STAT SUGAR FREE 64)  30 mL Per Tube TID  . insulin aspart  0-9 Units Subcutaneous Q4H  . latanoprost  1 drop Both Eyes QHS  . mouth rinse  15 mL Mouth Rinse BID  . metoprolol tartrate  12.5 mg Per Tube BID  . multivitamin  1 tablet Oral QHS  . Warfarin - Pharmacist Dosing Inpatient   Does not apply q1800     LOS: 11 days   Cherene Altes, MD [facility] Office  [phone] Pager - Text Page per Amion as per below:  On-Call/Text Page:      [email or web address]      password [email or web address]  If 7PM-7AM, please contact night-coverage [email or web address] Password [email or web address] 01/10/2018, 5:18 PM

## 2018-01-10 NOTE — Progress Notes (Signed)
[place] for Heparin and warfarin Indication: PAF with h/o CVA  Patient Measurements: Height: 5\' 10"  (177.8 cm) Weight: 199 lb 15.3 oz (90.7 kg) IBW/kg (Calculated) : 73 Heparin Dosing Weight: 90 kg  Vital Signs: Temp: 99.3 F (37.4 C) (06/17 0730) Temp Source: Oral (06/17 0730) BP: 112/62 (06/17 0830) Pulse Rate: 75 (06/17 0830)  Labs: Recent Labs    01/08/18 0008  01/09/18 0500 01/09/18 1210 01/10/18 0548  HGB 9.3*  --  9.0*  --  8.6*  HCT 29.5*  --  28.6*  --  27.0*  PLT 544*  --  499*  --  498*  LABPROT 19.4*  --  17.1*  --  19.7*  INR 1.65  --  1.41  --  1.69  HEPARINUNFRC <0.10*   < > 0.32 0.34 0.41  CREATININE 9.92*  --  7.25*  --  9.18*   < > = values in this interval not displayed.   Estimated Creatinine Clearance: 9 mL/min (A) (by C-G formula based on SCr of 9.18 mg/dL (H)).  Assessment: 26 YOM on warfarin PTA for hx Afib and CVA - reversed with Vit K on 6/8 and again 6/13, was on Heparin bridge while awaiting HD access - placed 6/14. Heparin resumed 6/15 and warfarin resumed yesterday evening.  Heparin level remains therapeutic at 0.41units/mL, INR trending up 1.69 No bleeding noted, CBC is stable. Due for Aranesp tomorrow with HD.  Goal of Therapy:  Heparin level 0.3-0.7 units/ml Monitor platelets by anticoagulation protocol: Yes   Plan:  Continue Heparin at 1700 units/hr (17 ml/hr) Warfarin 5mg  po x1 tonight  Daily heparin level, INR, and CBC Will continue to monitor for any signs/symptoms of bleeding  Latina Frank D. Kinslei Labine, PharmD, BCPS Clinical Pharmacist [phone] Please check AMION for all [place] numbers 01/10/2018 10:06 AM

## 2018-01-10 NOTE — Progress Notes (Addendum)
Daily Progress Note   Patient Name: Oscar Castillo       Date: 01/10/2018 DOB: [birth year]  Age: 67 y.o. MRN#: [id number] Attending Physician: Cherene Altes, MD Primary Care Physician: [name], MD Admit Date: 12/30/2017  Reason for Consultation/Follow-up: Support  Subjective: Patient is resting in bed, opens eyes briefly to voice. No family at bedside. Called to speak with wife. She states she is still optimistic that if he has an infection that is treated he will improve. She states prior to the 28th, he was talking and interacting like anyone else.   Length of Stay: 11  Current Medications: Scheduled Meds:  . amiodarone  200 mg Per Tube Daily  . atorvastatin  40 mg Per Tube QHS  . Chlorhexidine Gluconate Cloth  6 each Topical Q0600  . [START ON 01/11/2018] darbepoetin (ARANESP) injection - DIALYSIS  150 mcg Intravenous Q Tue-HD  . escitalopram  10 mg Per Tube Daily  . famotidine  20 mg Oral QHS  . feeding supplement (NEPRO CARB STEADY)  1,000 mL Per Tube Q24H  . feeding supplement (PRO-STAT SUGAR FREE 64)  30 mL Per Tube TID  . insulin aspart  0-9 Units Subcutaneous Q4H  . latanoprost  1 drop Both Eyes QHS  . mouth rinse  15 mL Mouth Rinse BID  . metoprolol tartrate  12.5 mg Per Tube BID  . multivitamin  1 tablet Oral QHS  . warfarin  5 mg Oral ONCE-1800  . Warfarin - Pharmacist Dosing Inpatient   Does not apply q1800    Continuous Infusions: . sodium chloride Stopped (01/02/18 1800)  . sodium chloride 10 mL/hr at 01/05/18 0907  . sodium chloride 10 mL/hr at 01/07/18 1900  . heparin 1,700 Units/hr (01/10/18 0717)  . piperacillin-tazobactam (ZOSYN)  IV Stopped (01/10/18 1709)    PRN Meds: sodium chloride, acetaminophen (TYLENOL) oral liquid 160 mg/5 mL  Physical  Exam  Constitutional:  Eyes closed.  Pulmonary/Chest: Effort normal.  Skin: Skin is warm.            Vital Signs: BP 97/72 (BP Location: Right Arm)   Pulse 84   Temp 98.2 F (36.8 C) (Oral)   Resp (!) 29   Ht 5\' 10"  (1.778 m)   Wt 88.5 kg (195 lb 1.7 oz)   SpO2 100%  BMI 27.99 kg/m  SpO2: SpO2: 100 % O2 Device: O2 Device: Room Air O2 Flow Rate: O2 Flow Rate (L/min): 6 L/min  Intake/output summary:   Intake/Output Summary (Last 24 hours) at 01/10/2018 1650 Last data filed at 01/10/2018 1500 Gross per 24 hour  Intake 3123.52 ml  Output 2225 ml  Net 898.52 ml   LBM: Last BM Date: 01/08/18 Baseline Weight: Weight: 87.5 kg (193 lb) Most recent weight: Weight: 88.5 kg (195 lb 1.7 oz)       Palliative Assessment/Data: NPO.    Flowsheet Rows     Most Recent Value  Intake Tab  Referral Department  Critical care  Palliative Care Primary Diagnosis  Other (Comment) [chronic kidney disease ]  Date Notified  01/01/18  Palliative Care Type  Return patient Palliative Care  Reason for referral  Clarify Goals of Care  Date of Admission  12/30/17  Date first seen by Palliative Care  01/03/18  # of days Palliative referral response time  2 Day(s)  # of days IP prior to Palliative referral  2  Clinical Assessment  Psychosocial & Spiritual Assessment  Palliative Care Outcomes      Patient Active Problem List   Diagnosis Date Noted  . Pressure injury of skin 11/27/2017  . Toxic metabolic encephalopathy 36/64/4034  . Pulmonary edema   . Septic shock ([place]) 11/22/2017  . Severe sepsis ([place]) 11/21/2017  . Chronic pain 09/07/2017  . CHB (complete heart block) ([place]) 04/29/2017  . Heart block AV third degree ([place]) 04/29/2017  . PAD (peripheral artery disease) ([place])   . Gait disorder 02/10/2017  . Anemia due to stage 5 chronic kidney disease ( [place])   . Benign neoplasm of descending colon   . Duodenal ulcer without hemorrhage or perforation   . Paroxysmal atrial fibrillation  ([place]) 09/15/2016  . Glaucoma 12/27/2015  . LBBB (left bundle branch block)   . Liver fibrosis 10/07/2015  . Cigarette nicotine dependence without complication 74/25/9563  . Obesity (BMI 30-39.9) 06/06/2015  . Organ transplant candidate 05/17/2015  . DM (diabetes mellitus) with peripheral vascular complication ([place]) 87/56/4332  . Insulin-dependent diabetes mellitus with proliferative retinopathy ([place]) 05/17/2015  . History of alcoholism ([place]) 05/10/2015  . Ulcer of sacral region, stage 3 ([place]) 09/26/2013  . S/P BKA (below knee amputation) bilateral ([place]) 09/08/2013  . ESRD on hemodialysis ([place]) 05/09/2013  . Cerebrovascular accident ([place]) 02/20/2013  . Acute on chronic combined systolic and diastolic CHF (congestive heart failure) ([place]) 10/28/2012  . GI bleed 10/27/2012  . Mass in rectum 10/27/2012  . Type 2 diabetes mellitus with chronic kidney disease on chronic dialysis, with long-term current use of insulin ([place]) 09/08/2012  . Essential (primary) hypertension 09/08/2012  . LVH (left ventricular hypertrophy)-severe concentric 06/13/2012  . Hyperlipidemia, unspecified 01/30/2011  . CHOLELITHIASIS 08/01/2010  . BENIGN PROSTATIC HYPERTROPHY 08/01/2010  . History of cardiovascular disorder 08/06/2009  . Depression 03/18/2009  . NEPHROLITHIASIS, HX OF 03/18/2009  . Major depressive disorder, single episode, unspecified 03/18/2009  . Morbid obesity ([place]) 03/25/2007  . Hypertensive heart disease with CHF (congestive heart failure) ([place]) 03/25/2007  . GERD (gastroesophageal reflux disease) 03/25/2007  . Chronic hepatitis C without hepatic coma ([place]) 03/25/2007    Palliative Care Assessment & Plan   Patient Profile:  Oscar Castillo is a 67 y.o. male with ESRD on HD (TTS [facility]) DM Type 2, HTN, Hx CVA, paroxsymal Afib on chronic Coumadin, Hep C, Hx complete heart block s/p PPM, bilat BKA.  He  was admitted to [facility] admission last month with AMS. He was treated for  septic shock. He was discharged from [facility] to a facility.   Presented to ED yesterday with fevers and hypotension concerning for sepsis. Looking for cause of sepsis.    Assessment/Recommendations/Plan:  Wife is hopeful to find source of infection with treatment and improvement.  She is recognizing there is a lot going on.   Will continue to follow peripherally. Please call for immediate needs.   Code Status:    Code Status Orders  (From admission, onward)        Start     Ordered   12/30/17 1412  Full code  Continuous     12/30/17 1416    Code Status History    Date Active Date Inactive Code Status Order ID Comments User Context   11/22/2017 0258 12/02/2017 2100 Full Code [id number]  Reyne Dumas, MD ED   11/21/2017 2242 11/22/2017 0257 Full Code [id number]  Vilma Prader, MD ED   05/07/2017 0005 05/09/2017 2021 Full Code [id number]  Norval Morton, MD ED   04/29/2017 2006 05/02/2017 1945 Full Code [id number]  Lorretta Harp, MD Inpatient   04/29/2017 2006 04/29/2017 2006 Full Code [id number]  Conrad Bantam, MD Inpatient   12/14/2016 1105 12/14/2016 1450 Full Code [id number]  Conrad Rhinecliff, MD Inpatient   11/08/2016 0526 11/14/2016 1834 Full Code [id number]  Edwin Dada, MD Inpatient   10/07/2016 1247 10/09/2016 1758 Full Code [id number]  Brenton Grills, PA-C Inpatient   09/26/2016 1310 10/02/2016 2057 Full Code [id number]  Elwin Mocha, MD ED   09/15/2016 1525 09/16/2016 2143 Full Code [id number]  Orson Eva, MD Inpatient   12/27/2015 0131 12/31/2015 1716 Full Code [id number]  Reubin [name], MD Inpatient   10/25/2013 1006 10/31/2013 2122 Full Code [id number]  Marybelle Killings, MD Inpatient   10/11/2013 1206 10/25/2013 1006 Full Code [id number]  Newt Minion, MD Inpatient   10/01/2013 1633 10/11/2013 1206 Full Code [id number]  Nita Sells, MD Inpatient   09/08/2013 1646 09/11/2013 1851 Full Code [id number]  Newt Minion, MD Inpatient   06/09/2013 1537 06/12/2013 1720  Full Code [id number]  Newt Minion, MD Inpatient   05/12/2013 1741 05/15/2013 2209 Full Code [id number]  Newt Minion, MD Inpatient   05/09/2013 0111 05/12/2013 1741 Full Code [id number]  Rise Patience, MD Inpatient   11/01/2012 1441 11/13/2012 1551 Full Code [id number]  Velvet Bathe, MD Inpatient   10/28/2012 1506 10/30/2012 1655 Full Code [id number]  Elmarie Shiley, MD Inpatient   09/12/2012 1641 09/24/2012 0850 Full Code [id number]  Cathlyn Parsons, PA Inpatient   09/08/2012 0225 09/12/2012 1641 Full Code [id number]  Toy Baker, MD Inpatient       Prognosis:  Very poor. < 6 months.  Discharge Planning:  To Be Determined  Care plan was discussed with MD.  Thank you for allowing the Palliative Medicine Team to assist in the care of this patient.   Total Time 25 min Prolonged Time Billed No       Greater than 50%  of this time was spent counseling and coordinating care related to the above assessment and plan.  Asencion Gowda, NP  Please contact Palliative Medicine Team phone at [phone] for questions and concerns.

## 2018-01-10 NOTE — Progress Notes (Signed)
[facility] Progress Note   Subjective:  Seen on HD (extra HD for volume overload). No CP/dyspnea. Febrile overnight with ^ WBC, re-cultured and Zosyn added to Vancomycin. Remains on heparin drip.  Objective Vitals:   01/10/18 0830 01/10/18 0900 01/10/18 0930 01/10/18 1000  BP: 112/62 110/62 113/69 109/65  Pulse: 75 75 76 78  Resp: (!) 25 (!) 24 (!) 24 (!) 24  Temp:      TempSrc:      SpO2:      Weight:      Height:       Physical Exam General: Elderly man, NAD Heart: RRR; no murmur Lungs: CTA anteriorly Abdomen: soft, non-tender Extremities: Trace stump edema Dialysis Access: R femoral temp cath in place, L IJ cath removed.  Additional Objective Labs: Basic Metabolic Panel: Recent Labs  Lab 01/05/18 0820 01/06/18 0636  01/08/18 0008 01/08/18 0459 01/09/18 0500 01/10/18 0548  NA 138 137   < > 136  --  136 136  K 3.9 4.2   < > 4.7  --  3.9 4.0  CL 99* 99*   < > 97*  --  98* 97*  CO2 26 26   < > 26  --  26 26  GLUCOSE 135* 152*   < > 188*  --  169* 206*  BUN 51* 69*   < > 100*  --  64* 86*  CREATININE 6.15* 7.66*   < > 9.92*  --  7.25* 9.18*  CALCIUM 9.0 8.9   < > 9.1  --  8.9 9.1  PHOS 1.0* 2.4*  --   --  3.5  --   --    < > = values in this interval not displayed.   Liver Function Tests: Recent Labs  Lab 01/05/18 0820 01/10/18 0548  AST  --  13*  ALT  --  12*  ALKPHOS  --  53  BILITOT  --  0.6  PROT  --  7.8  ALBUMIN 2.1* 2.1*   CBC: Recent Labs  Lab 01/06/18 0636 01/07/18 0726 01/08/18 0008 01/09/18 0500 01/10/18 0548  WBC 10.1 10.3 10.0 14.4* 13.1*  HGB 9.2* 9.2* 9.3* 9.0* 8.6*  HCT 28.4* 28.9* 29.5* 28.6* 27.0*  MCV 85.8 87.8 88.1 86.7 86.5  PLT 508* 498* 544* 499* 498*   Studies/Results: Dg Chest Port 1 View  Result Date: 01/09/2018 CLINICAL DATA:  Pneumonia EXAM: PORTABLE CHEST 1 VIEW COMPARISON:  Chest x-rays dated 01/07/2018 and 01/02/2018. FINDINGS: The femoral dialysis catheter is stable in position with extension  through the RIGHT atrium and into the lower SVC. Heart size and mediastinal contours are stable. No evidence of pneumonia on today's exam. No pleural effusion or pneumothorax seen. IMPRESSION: 1. No evidence of pneumonia on today's exam. 2. Femoral dialysis catheter is stable in position, extending through the RIGHT atrium to the level of the lower SVC. As indicated on the earlier chest x-ray report, would consider retracting approximately 6 cm for optimal radiographic positioning. Electronically Signed   By: Franki Cabot M.D.   On: 01/09/2018 13:52   Medications: . sodium chloride Stopped (01/02/18 1800)  . sodium chloride 10 mL/hr at 01/05/18 0907  . sodium chloride    . sodium chloride    . sodium chloride 10 mL/hr at 01/07/18 1900  . heparin 1,700 Units/hr (01/10/18 0717)  . piperacillin-tazobactam (ZOSYN)  IV Stopped (01/10/18 0224)   . amiodarone  200 mg Per Tube Daily  . atorvastatin  40 mg Per Tube  QHS  . Chlorhexidine Gluconate Cloth  6 each Topical Q0600  . [START ON 01/11/2018] darbepoetin (ARANESP) injection - DIALYSIS  150 mcg Intravenous Q Tue-HD  . escitalopram  10 mg Per Tube Daily  . famotidine  20 mg Oral QHS  . feeding supplement (NEPRO CARB STEADY)  1,000 mL Per Tube Q24H  . feeding supplement (PRO-STAT SUGAR FREE 64)  30 mL Per Tube TID  . insulin aspart  0-9 Units Subcutaneous Q4H  . latanoprost  1 drop Both Eyes QHS  . mouth rinse  15 mL Mouth Rinse BID  . metoprolol tartrate  12.5 mg Per Tube BID  . multivitamin  1 tablet Oral QHS  . warfarin  5 mg Oral ONCE-1800  . Warfarin - Pharmacist Dosing Inpatient   Does not apply q1800   Dialysis Orders: [facility] 4h 180NRe BFR 400/800 EDW 88kg 2K/2Ca Profile 4 Na Linear L TDC Heparin 4000 Bolus, 2000 mid - Venofer 100mg  IV x 10 (to start 6/8)  Assessment/Plan: 1. Severe sepsis/CoNS bacteremia (12/30/17): RLL infiltrates on chest CT. Has been on Vanc; Zosyn resumed yesterday d/t ongoing fever. Using temp HD line,  repeat BCx 6/16 pending.  2. ESRD: Continue HD per TTS schedule. Extra HD today d/t volume; back tomorrow. 3. Hypotension/Volume: on midodrine for BP support. Extra HD today. UF as tolerated.  4. Anemia of CKD: Hgb down to 8.6, for Aranesp 191mcg on 1/82. 5. Metabolic bone disease. Ca/Phos better. Phos was low. Binder (phoslo) on hold. 6. Nutrition: Alb low, continue pro-stat. 7. AFib. Chronic Coumadin and IV heparin per pharmacy. 8. DM  9. Encephalopathy: Ongoing issue, stable. 10. Chronic stump wounds: Per primary.  Veneta Penton, PA-C 01/10/2018, 11:14 AM  [employer] Pager: [phone]

## 2018-01-11 DIAGNOSIS — R791 Abnormal coagulation profile: Secondary | ICD-10-CM | POA: Diagnosis not present

## 2018-01-11 DIAGNOSIS — I480 Paroxysmal atrial fibrillation: Secondary | ICD-10-CM | POA: Diagnosis present

## 2018-01-11 DIAGNOSIS — R1310 Dysphagia, unspecified: Secondary | ICD-10-CM | POA: Diagnosis not present

## 2018-01-11 DIAGNOSIS — N189 Chronic kidney disease, unspecified: Secondary | ICD-10-CM | POA: Diagnosis not present

## 2018-01-11 DIAGNOSIS — Z89512 Acquired absence of left leg below knee: Secondary | ICD-10-CM | POA: Diagnosis not present

## 2018-01-11 DIAGNOSIS — E1122 Type 2 diabetes mellitus with diabetic chronic kidney disease: Secondary | ICD-10-CM | POA: Diagnosis not present

## 2018-01-11 DIAGNOSIS — I219 Acute myocardial infarction, unspecified: Secondary | ICD-10-CM | POA: Diagnosis not present

## 2018-01-11 DIAGNOSIS — Z7901 Long term (current) use of anticoagulants: Secondary | ICD-10-CM | POA: Diagnosis not present

## 2018-01-11 DIAGNOSIS — T8249XA Other complication of vascular dialysis catheter, initial encounter: Secondary | ICD-10-CM | POA: Diagnosis not present

## 2018-01-11 DIAGNOSIS — G546 Phantom limb syndrome with pain: Secondary | ICD-10-CM | POA: Diagnosis not present

## 2018-01-11 DIAGNOSIS — Z8673 Personal history of transient ischemic attack (TIA), and cerebral infarction without residual deficits: Secondary | ICD-10-CM | POA: Diagnosis not present

## 2018-01-11 DIAGNOSIS — D631 Anemia in chronic kidney disease: Secondary | ICD-10-CM | POA: Diagnosis not present

## 2018-01-11 DIAGNOSIS — F319 Bipolar disorder, unspecified: Secondary | ICD-10-CM | POA: Diagnosis not present

## 2018-01-11 DIAGNOSIS — R6520 Severe sepsis without septic shock: Secondary | ICD-10-CM | POA: Diagnosis not present

## 2018-01-11 DIAGNOSIS — Z794 Long term (current) use of insulin: Secondary | ICD-10-CM | POA: Diagnosis not present

## 2018-01-11 DIAGNOSIS — T82598A Other mechanical complication of other cardiac and vascular devices and implants, initial encounter: Secondary | ICD-10-CM | POA: Diagnosis not present

## 2018-01-11 DIAGNOSIS — R4189 Other symptoms and signs involving cognitive functions and awareness: Secondary | ICD-10-CM | POA: Diagnosis not present

## 2018-01-11 DIAGNOSIS — E1151 Type 2 diabetes mellitus with diabetic peripheral angiopathy without gangrene: Secondary | ICD-10-CM | POA: Diagnosis not present

## 2018-01-11 DIAGNOSIS — D649 Anemia, unspecified: Secondary | ICD-10-CM | POA: Diagnosis not present

## 2018-01-11 DIAGNOSIS — I96 Gangrene, not elsewhere classified: Secondary | ICD-10-CM | POA: Diagnosis not present

## 2018-01-11 DIAGNOSIS — E785 Hyperlipidemia, unspecified: Secondary | ICD-10-CM | POA: Diagnosis present

## 2018-01-11 DIAGNOSIS — M6281 Muscle weakness (generalized): Secondary | ICD-10-CM | POA: Diagnosis not present

## 2018-01-11 DIAGNOSIS — Z89511 Acquired absence of right leg below knee: Secondary | ICD-10-CM | POA: Diagnosis not present

## 2018-01-11 DIAGNOSIS — Z992 Dependence on renal dialysis: Secondary | ICD-10-CM | POA: Diagnosis not present

## 2018-01-11 DIAGNOSIS — T8781 Dehiscence of amputation stump: Secondary | ICD-10-CM | POA: Diagnosis present

## 2018-01-11 DIAGNOSIS — Z950 Presence of cardiac pacemaker: Secondary | ICD-10-CM | POA: Diagnosis not present

## 2018-01-11 DIAGNOSIS — A419 Sepsis, unspecified organism: Secondary | ICD-10-CM | POA: Diagnosis not present

## 2018-01-11 DIAGNOSIS — T8789 Other complications of amputation stump: Secondary | ICD-10-CM | POA: Diagnosis present

## 2018-01-11 DIAGNOSIS — I132 Hypertensive heart and chronic kidney disease with heart failure and with stage 5 chronic kidney disease, or end stage renal disease: Secondary | ICD-10-CM | POA: Diagnosis present

## 2018-01-11 DIAGNOSIS — E1152 Type 2 diabetes mellitus with diabetic peripheral angiopathy with gangrene: Secondary | ICD-10-CM | POA: Diagnosis present

## 2018-01-11 DIAGNOSIS — G9340 Encephalopathy, unspecified: Secondary | ICD-10-CM | POA: Diagnosis not present

## 2018-01-11 DIAGNOSIS — Z89612 Acquired absence of left leg above knee: Secondary | ICD-10-CM | POA: Diagnosis not present

## 2018-01-11 DIAGNOSIS — F0390 Unspecified dementia without behavioral disturbance: Secondary | ICD-10-CM | POA: Diagnosis not present

## 2018-01-11 DIAGNOSIS — Z7409 Other reduced mobility: Secondary | ICD-10-CM | POA: Diagnosis not present

## 2018-01-11 DIAGNOSIS — I5042 Chronic combined systolic (congestive) and diastolic (congestive) heart failure: Secondary | ICD-10-CM | POA: Diagnosis present

## 2018-01-11 DIAGNOSIS — G9341 Metabolic encephalopathy: Secondary | ICD-10-CM | POA: Diagnosis present

## 2018-01-11 DIAGNOSIS — G92 Toxic encephalopathy: Secondary | ICD-10-CM | POA: Diagnosis not present

## 2018-01-11 DIAGNOSIS — R6521 Severe sepsis with septic shock: Secondary | ICD-10-CM | POA: Diagnosis not present

## 2018-01-11 DIAGNOSIS — N2581 Secondary hyperparathyroidism of renal origin: Secondary | ICD-10-CM | POA: Diagnosis not present

## 2018-01-11 DIAGNOSIS — Z8249 Family history of ischemic heart disease and other diseases of the circulatory system: Secondary | ICD-10-CM | POA: Diagnosis not present

## 2018-01-11 DIAGNOSIS — I959 Hypotension, unspecified: Secondary | ICD-10-CM | POA: Diagnosis not present

## 2018-01-11 DIAGNOSIS — Z431 Encounter for attention to gastrostomy: Secondary | ICD-10-CM | POA: Diagnosis not present

## 2018-01-11 DIAGNOSIS — N186 End stage renal disease: Secondary | ICD-10-CM | POA: Diagnosis not present

## 2018-01-11 DIAGNOSIS — E872 Acidosis: Secondary | ICD-10-CM | POA: Diagnosis not present

## 2018-01-11 DIAGNOSIS — R5381 Other malaise: Secondary | ICD-10-CM | POA: Diagnosis not present

## 2018-01-11 DIAGNOSIS — R509 Fever, unspecified: Secondary | ICD-10-CM | POA: Diagnosis not present

## 2018-01-11 DIAGNOSIS — R2689 Other abnormalities of gait and mobility: Secondary | ICD-10-CM | POA: Diagnosis not present

## 2018-01-11 DIAGNOSIS — L97929 Non-pressure chronic ulcer of unspecified part of left lower leg with unspecified severity: Secondary | ICD-10-CM | POA: Diagnosis present

## 2018-01-11 DIAGNOSIS — E1165 Type 2 diabetes mellitus with hyperglycemia: Secondary | ICD-10-CM | POA: Diagnosis not present

## 2018-01-11 DIAGNOSIS — L89152 Pressure ulcer of sacral region, stage 2: Secondary | ICD-10-CM | POA: Diagnosis present

## 2018-01-11 DIAGNOSIS — Z6841 Body Mass Index (BMI) 40.0 and over, adult: Secondary | ICD-10-CM | POA: Diagnosis not present

## 2018-01-11 DIAGNOSIS — R0600 Dyspnea, unspecified: Secondary | ICD-10-CM | POA: Diagnosis not present

## 2018-01-11 DIAGNOSIS — T8189XA Other complications of procedures, not elsewhere classified, initial encounter: Secondary | ICD-10-CM | POA: Diagnosis not present

## 2018-01-11 DIAGNOSIS — I70262 Atherosclerosis of native arteries of extremities with gangrene, left leg: Secondary | ICD-10-CM | POA: Diagnosis not present

## 2018-01-11 DIAGNOSIS — K219 Gastro-esophageal reflux disease without esophagitis: Secondary | ICD-10-CM | POA: Diagnosis not present

## 2018-01-11 DIAGNOSIS — N400 Enlarged prostate without lower urinary tract symptoms: Secondary | ICD-10-CM | POA: Diagnosis not present

## 2018-01-11 DIAGNOSIS — Z833 Family history of diabetes mellitus: Secondary | ICD-10-CM | POA: Diagnosis not present

## 2018-01-11 LAB — BASIC METABOLIC PANEL
Anion gap: 13 (ref 5–15)
BUN: 58 mg/dL — AB (ref 6–20)
CHLORIDE: 96 mmol/L — AB (ref 101–111)
CO2: 26 mmol/L (ref 22–32)
CREATININE: 6.18 mg/dL — AB (ref 0.61–1.24)
Calcium: 9.1 mg/dL (ref 8.9–10.3)
GFR calc Af Amer: 10 mL/min — ABNORMAL LOW (ref >60)
GFR calc non Af Amer: 8 mL/min — ABNORMAL LOW (ref >60)
Glucose, Bld: 202 mg/dL — ABNORMAL HIGH (ref 65–99)
Potassium: 4.1 mmol/L (ref 3.5–5.1)
SODIUM: 135 mmol/L (ref 135–145)

## 2018-01-11 LAB — PROTIME-INR
INR: 2.1
Prothrombin Time: 23.4 seconds — ABNORMAL HIGH (ref 11.4–15.2)

## 2018-01-11 LAB — CBC
HEMATOCRIT: 28.1 % — AB (ref 39.0–52.0)
Hemoglobin: 8.7 g/dL — ABNORMAL LOW (ref 13.0–17.0)
MCH: 27.5 pg (ref 26.0–34.0)
MCHC: 31 g/dL (ref 30.0–36.0)
MCV: 88.9 fL (ref 78.0–100.0)
Platelets: 457 10*3/uL — ABNORMAL HIGH (ref 150–400)
RBC: 3.16 MIL/uL — ABNORMAL LOW (ref 4.22–5.81)
RDW: 16.6 % — AB (ref 11.5–15.5)
WBC: 14 10*3/uL — ABNORMAL HIGH (ref 4.0–10.5)

## 2018-01-11 LAB — GLUCOSE, CAPILLARY
Glucose-Capillary: 182 mg/dL — ABNORMAL HIGH (ref 65–99)
Glucose-Capillary: 192 mg/dL — ABNORMAL HIGH (ref 65–99)
Glucose-Capillary: 204 mg/dL — ABNORMAL HIGH (ref 65–99)

## 2018-01-11 LAB — HEPARIN LEVEL (UNFRACTIONATED): Heparin Unfractionated: 0.53 IU/mL (ref 0.30–0.70)

## 2018-01-11 LAB — MAGNESIUM: MAGNESIUM: 2 mg/dL (ref 1.7–2.4)

## 2018-01-11 LAB — VANCOMYCIN, RANDOM: VANCOMYCIN RM: 34

## 2018-01-11 MED ORDER — FAMOTIDINE 20 MG PO TABS: 20.0000 mg | ORAL_TABLET | Freq: Every day | ORAL | Status: AC

## 2018-01-11 MED ORDER — METOPROLOL TARTRATE 25 MG/10 ML ORAL SUSPENSION: 12.5000 mg | Freq: Two times a day (BID) | ORAL | Status: AC

## 2018-01-11 MED ORDER — DARBEPOETIN ALFA 150 MCG/0.3ML IJ SOSY: 150.0000 ug | PREFILLED_SYRINGE | INTRAMUSCULAR | Status: AC

## 2018-01-11 MED ORDER — ACETAMINOPHEN 160 MG/5ML PO SOLN: 650.0000 mg | Freq: Four times a day (QID) | ORAL | 0 refills | Status: AC | PRN

## 2018-01-11 MED ORDER — INSULIN ASPART 100 UNIT/ML ~~LOC~~ SOLN: 0.0000 [IU] | SUBCUTANEOUS | 11 refills | Status: AC

## 2018-01-11 MED ORDER — WARFARIN SODIUM 2.5 MG PO TABS: 2.5000 mg | ORAL_TABLET | Freq: Once | ORAL | Status: DC

## 2018-01-11 MED ORDER — DARBEPOETIN ALFA 150 MCG/0.3ML IJ SOSY
PREFILLED_SYRINGE | INTRAMUSCULAR | Status: AC
  Filled 2018-01-11: qty 0.3

## 2018-01-11 MED ORDER — NEPRO/CARBSTEADY PO LIQD: 1000.0000 mL | ORAL | 0 refills | Status: AC

## 2018-01-11 MED ORDER — RENA-VITE PO TABS: 1.0000 | ORAL_TABLET | Freq: Every day | ORAL | 0 refills | Status: AC

## 2018-01-11 MED ORDER — WARFARIN SODIUM 2.5 MG PO TABS: 2.5000 mg | ORAL_TABLET | Freq: Once | ORAL | Status: AC

## 2018-01-11 MED ORDER — FAMOTIDINE 20 MG PO TABS: 20.0000 mg | ORAL_TABLET | Freq: Every day | ORAL | Status: DC

## 2018-01-11 MED ORDER — LATANOPROST 0.005 % OP SOLN: 1.0000 [drp] | Freq: Every day | OPHTHALMIC | 12 refills | Status: AC

## 2018-01-11 MED ORDER — PRO-STAT SUGAR FREE PO LIQD: 30.0000 mL | Freq: Three times a day (TID) | ORAL | 0 refills | Status: AC

## 2018-01-11 MED ORDER — WARFARIN - PHARMACIST DOSING INPATIENT: Freq: Every day | Status: DC

## 2018-01-11 NOTE — Discharge Summary (Addendum)
DISCHARGE SUMMARY  Oscar Castillo  MR#: [id number]  DOB:1951/05/02  Date of Admission: 12/30/2017 Date of Discharge: 01/11/2018  Attending Physician:Khady Vandenberg Hennie Duos, MD  Patient's YSA:YTKZ, Hunt Oris, MD  Consults: PCCM Nephrology Vascular Surgery  Disposition: D/C to [facility] for closer monitoring for recurrent SIRS/Sepsis   Tests Needing Follow-up: -will need to monitor closely for evidence of infection or recurrent fever, which would necessitate repeat infection evaluation -monitor volume status in pt w/ CHF  -monitor INR in pt on warfarin daily until confirmed remains therapeutic  -routine monitoring ro amiodarone toxicity is required    Discharge Diagnoses: SIRS v/s Sepsis of unclear etiology Acute on Chronic Encephalopathy Chronic systolic and diastolic CHF Paroxysmal atrial fibrillation Dysphagia Enteritis ESRD on HD T/Th/Sat  Bilateral BKA / bedbound / stage II sacral decubitus ulcer L lower extremity lacerations DM2 HLD  Initial presentation: 67yo M w/ a Hx of Bipolar disorder, CVA 2009, Chronic Encephalopathy, cholelithiasis, Chronic combined systolic and diastolic CHF, HTN, LBBB, PAF, DM2, ESRD on HD,GI bleed, Hepatitis C, Morbid obesity, bedbound status, and B BKA w/ ongoing encephalopathy since its acute onset 11/21/17 who was discharged to [facility] from this hospital 12/02/17 and was brought back to [facility] on 12/30/2017 for sepsis and persisting encephalopathy.    Hospital Course:  SIRS v/s Sepsis of unclear etiology No true fever since 6/16 - all cultures negative thus far w/ no clinical evidence of an active infection - abx being stopped at time of d/c - will need to monitor closely for evidence of infection or recurrent fever which would necessitate repeat infection evaluation - ?central fever  Acute on Chronic Encephalopathy Exact etiology unclear, but pt does not appear to have had any meaningful recovery since the onset of his sx 4/28 -  very little chance of recovery at this late point - cont supportive care and investigation on potential etiologies at [facility]   Chronic systolic and diastolic CHF TTE 6/0/10 noted EF 30-35% w/ severe hypokinesis of septal and inferior wall - no clinical evidence of signif volume overload  Filed Weights   01/10/18 1214 01/11/18 0705 01/11/18 1044  Weight: 88.5 kg (195 lb 1.7 oz) 90.1 kg (198 lb 10.2 oz) 88.1 kg (194 lb 3.6 oz)    Paroxysmal atrial fibrillation Rate controlled - anticoag w/ heparin > coumadin - cont amiodarone   Dysphagia secondary to encephalopathy - continue PEG tube feeds - PEG w/o trouble during this hospital stay   Enteritis C. difficile negative, GI pathogen panel negative - likely diarrhea due to osmolar diuresis from tube feeds  ESRD on HD T/Th/Sat  Care per Nephrology - 6/14 S/P placement RIGHT tunneled Femoral HD cath - received HD 6/18 prior to d/c to LTACH   Bilateral BKA / bedbound / stage II sacral decubitus ulcer/ L lower extremity lacerations multiple LLE lacerations on his BKA stump in various stages of healing felt to be due to injury from prosthesis - care as per [facility] RN suggestions:  "Cleanse wounds to bilateral lower legs with soap and water and pat dry.  SIlicone foam dressing to pad and protect. Change every three days and PRN soilage."  DM2 CBG reasonably controlled at time of d/c   HLD Cont medical tx    Allergies as of 01/11/2018      Reactions   Ceftriaxone Sodium In Dextrose Anaphylaxis   Tolerates Zosyn   Ciprofloxacin Other (See Comments)   UNSPECIFIED PAIN   Nsaids Other (See Comments)   HISTORY OF ULCER  Sulfa Antibiotics Swelling   SWELLING REACTION UNSPECIFIED       Medication List    STOP taking these medications   escitalopram 10 MG tablet Commonly known as:  LEXAPRO   insulin lispro 100 UNIT/ML injection Commonly known as:  HUMALOG   midodrine 5 MG tablet Commonly known as:  PROAMATINE   RENVELA 800 MG  tablet Generic drug:  sevelamer carbonate   sorbitol 70 % Soln     TAKE these medications   acetaminophen 160 MG/5ML solution Commonly known as:  TYLENOL Place 20.3 mLs (650 mg total) into feeding tube every 6 (six) hours as needed for fever. What changed:    reasons to take this  Another medication with the same name was removed. Continue taking this medication, and follow the directions you see here.   amiodarone 200 MG tablet Commonly known as:  PACERONE Place 1 tablet (200 mg total) into feeding tube daily.   atorvastatin 40 MG tablet Commonly known as:  LIPITOR Place 1 tablet (40 mg total) into feeding tube at bedtime.   Darbepoetin Alfa 150 MCG/0.3ML Sosy injection Commonly known as:  ARANESP Inject 0.3 mLs (150 mcg total) into the vein every Tuesday with hemodialysis.   famotidine 20 MG tablet Commonly known as:  PEPCID Place 1 tablet (20 mg total) into feeding tube at bedtime. What changed:  Another medication with the same name was removed. Continue taking this medication, and follow the directions you see here.   feeding supplement (NEPRO CARB STEADY) Liqd Place 1,000 mLs into feeding tube daily. What changed:  additional instructions   feeding supplement (PRO-STAT SUGAR FREE 64) Liqd Place 30 mLs into feeding tube 3 (three) times daily. What changed:  when to take this   insulin aspart 100 UNIT/ML injection Commonly known as:  novoLOG Inject 0-9 Units into the skin every 4 (four) hours.   latanoprost 0.005 % ophthalmic solution Commonly known as:  XALATAN Place 1 drop into both eyes at bedtime.   metoprolol tartrate 25 mg/10 mL Susp Commonly known as:  LOPRESSOR Place 5 mLs (12.5 mg total) into feeding tube 2 (two) times daily.   multivitamin Tabs tablet Take 1 tablet by mouth at bedtime.   warfarin 2.5 MG tablet Commonly known as:  COUMADIN Take 1 tablet (2.5 mg total) by mouth one time only at 6 PM. What changed:    medication strength  how  much to take  how to take this       Day of Discharge BP 100/61   Pulse 81   Temp 98.9 F (37.2 C)   Resp 18   Ht 5\' 10"  (1.778 m)   Wt 88.1 kg (194 lb 3.6 oz)   SpO2 99%   BMI 27.87 kg/m   Physical Exam: General: No acute respiratory distress Lungs: Clear to auscultation bilaterally without wheezes or crackles Cardiovascular: Regular rate and rhythm without murmur gallop or rub normal S1 and S2 Abdomen: Nondistended, soft, bowel sounds positive, no rebound, no ascites, PEG insertion clean and dry  Extremities: No significant edema B LE stumps   Basic Metabolic Panel: Recent Labs  Lab 01/05/18 0820  01/06/18 0636 01/07/18 0726 01/08/18 0008 01/08/18 0459 01/09/18 0500 01/10/18 0548 01/11/18 0530  NA 138  --  137 135 136  --  136 136 135  K 3.9  --  4.2 4.6 4.7  --  3.9 4.0 4.1  CL 99*  --  99* 99* 97*  --  98* 97* 96*  CO2  26  --  26 23 26   --  26 26 26   GLUCOSE 135*  --  152* 133* 188*  --  169* 206* 202*  BUN 51*  --  69* 92* 100*  --  64* 86* 58*  CREATININE 6.15*  --  7.66* 9.15* 9.92*  --  7.25* 9.18* 6.18*  CALCIUM 9.0  --  8.9 9.0 9.1  --  8.9 9.1 9.1  MG  --    < > 2.2 2.2 2.5*  --  2.2 2.2 2.0  PHOS 1.0*  --  2.4*  --   --  3.5  --   --   --    < > = values in this interval not displayed.    Liver Function Tests: Recent Labs  Lab 01/05/18 0820 01/10/18 0548  AST  --  13*  ALT  --  12*  ALKPHOS  --  53  BILITOT  --  0.6  PROT  --  7.8  ALBUMIN 2.1* 2.1*    Recent Labs  Lab 01/08/18 0459  AMMONIA 23    Coags: Recent Labs  Lab 01/07/18 0726 01/08/18 0008 01/09/18 0500 01/10/18 0548 01/11/18 0530  INR 1.71 1.65 1.41 1.69 2.10    CBC: Recent Labs  Lab 01/07/18 0726 01/08/18 0008 01/09/18 0500 01/10/18 0548 01/11/18 0530  WBC 10.3 10.0 14.4* 13.1* 14.0*  HGB 9.2* 9.3* 9.0* 8.6* 8.7*  HCT 28.9* 29.5* 28.6* 27.0* 28.1*  MCV 87.8 88.1 86.7 86.5 88.9  PLT 498* 544* 499* 498* 457*    CBG: Recent Labs  Lab 01/10/18 1619  01/10/18 2011 01/11/18 0030 01/11/18 0426 01/11/18 1213  GLUCAP 181* 186* 204* 192* 182*    Recent Results (from the past 240 hour(s))  Culture, blood (routine x 2)     Status: None   Collection Time: 01/02/18  8:43 AM  Result Value Ref Range Status   Specimen Description BLOOD RIGHT ANTECUBITAL  Final   Special Requests   Final    BOTTLES DRAWN AEROBIC AND ANAEROBIC Blood Culture adequate volume   Culture   Final    NO GROWTH 5 DAYS Performed at [facility], [place] [address] [place], [place] [address]    Report Status 01/07/2018 FINAL  Final  Culture, blood (routine x 2)     Status: None   Collection Time: 01/02/18  8:53 AM  Result Value Ref Range Status   Specimen Description BLOOD RIGHT ANTECUBITAL  Final   Special Requests   Final    BOTTLES DRAWN AEROBIC AND ANAEROBIC Blood Culture adequate volume   Culture   Final    NO GROWTH 5 DAYS Performed at [facility], [place] [address], [place], [place] [address]    Report Status 01/07/2018 FINAL  Final  Culture, blood (routine x 2)     Status: None   Collection Time: 01/03/18  1:52 PM  Result Value Ref Range Status   Specimen Description BLOOD RIGHT HAND  Final   Special Requests   Final    BOTTLES DRAWN AEROBIC ONLY Blood Culture adequate volume   Culture   Final    NO GROWTH 5 DAYS Performed at [facility], [place] [address], [place], [place] [address]    Report Status 01/08/2018 FINAL  Final  Culture, blood (routine x 2)     Status: None   Collection Time: 01/03/18  1:52 PM  Result Value Ref Range Status   Specimen Description BLOOD RIGHT HAND  Final  Special Requests   Final    BOTTLES DRAWN AEROBIC ONLY Blood Culture results may not be optimal due to an inadequate volume of blood received in culture bottles   Culture   Final    NO GROWTH 5 DAYS Performed at [facility], [place] [address] [place], [place] [address]    Report Status 01/08/2018 FINAL  Final  Surgical PCR screen     Status: None    Collection Time: 01/05/18  8:30 PM  Result Value Ref Range Status   MRSA, PCR NEGATIVE NEGATIVE Final   Staphylococcus aureus NEGATIVE NEGATIVE Final    Comment: (NOTE) The Xpert SA Assay (FDA approved for NASAL specimens in patients 56 years of age and older), is one component of a comprehensive surveillance program. It is not intended to diagnose infection nor to guide or monitor treatment. Performed at [facility], [place] [address], [place], [place] [address]   Culture, blood (routine x 2)     Status: None (Preliminary result)   Collection Time: 01/09/18 12:38 PM  Result Value Ref Range Status   Specimen Description BLOOD LEFT HAND  Final   Special Requests   Final    BOTTLES DRAWN AEROBIC AND ANAEROBIC Blood Culture adequate volume   Culture   Final    NO GROWTH 2 DAYS Performed at [facility], [place] [address] [place], [place] [address]    Report Status PENDING  Incomplete  Culture, blood (routine x 2)     Status: None (Preliminary result)   Collection Time: 01/09/18 12:49 PM  Result Value Ref Range Status   Specimen Description BLOOD LEFT WRIST  Final   Special Requests   Final    BOTTLES DRAWN AEROBIC AND ANAEROBIC Blood Culture adequate volume   Culture   Final    NO GROWTH 2 DAYS Performed at [facility], [place] [address] [place], [place] [address]    Report Status PENDING  Incomplete     Time spent in discharge (includes decision making & examination of pt): 35 minutes  01/11/2018, 2:32 PM   Cherene Altes, MD [facility] Office  [phone] Pager [phone]  On-Call/Text Page:      [email or web address]      password [facility]

## 2018-01-11 NOTE — Care Management Important Message (Signed)
Important Message  Patient Details  Name: Oscar Castillo MRN: [id number] Date of Birth: [birth year]   Medicare Important Message Given:  Yes    Ella Bodo, RN 01/11/2018, 3:10 PM

## 2018-01-11 NOTE — Care Management Note (Signed)
Case Management Note  Patient Details  Name: Oscar Castillo MRN: [id number] Date of Birth: 1951/06/24  Subjective/Objective:  67yo male with  B BKA w/ ongoing encephalopathy since its acute onset 11/21/17 who was discharged to [facility] from this hospital 12/02/17 and was brought back to [facility] on 12/30/2017 for sepsis and persisting encephalopathy.  PTA, pt resided at [facility] for rehab.                      Action/Plan: Received LTAC consult from MD.  Family interested and agreeable to returning to [place] for continued care.  Coordinated transfer with Lenord Fellers, admissions liaison for [facility]  Family and attending MD made aware of bed available today.    Expected Discharge Date:    01/11/2018              Expected Discharge Plan:  Long Term Acute Care (LTAC)  In-House Referral:     Discharge planning Services  CM Consult  Post Acute Care Choice:  Long Term Acute Care (LTAC) Choice offered to:  Spouse  DME Arranged:    DME Agency:     HH Arranged:    [place] Agency:     Status of Service:  Completed, signed off  If discussed at [employer] of Stay Meetings, dates discussed:    Additional Comments:  01/11/18 J. Arizbeth Cawthorn, RN, BSN Transport arranged through [employer]  for approximately 3:00pm.  MD made aware of dc summary and EMTALA needed.    Reinaldo Raddle, RN, BSN  Trauma/Neuro ICU Case Manager [phone]

## 2018-01-11 NOTE — Progress Notes (Signed)
CSW notes patient discharging to [facility]. CSW signing off as no CSW needs.   Percell Locus Caitland Porchia LCSW [phone]

## 2018-01-11 NOTE — Progress Notes (Signed)
Pharmacy Antibiotic Note  Oscar Castillo is a 67 y.o. male admitted on 12/30/2017 and currently receiving Vancomycin for MRSE bacteremia plus Zosyn for fevers of unknown source.  Patient received 2 extra doses of vancomycin- one dose was given despite patient not going to HD, and another given pre-op when patient's catheter was replaced- SZPs completed for both. A pre-HD vancomycin level was obtained today and is elevated at 46mcg/mL. Patient is currently in HD. BFR has already been reduced- patient has not been tolerating full HD sessions.  Plan: HOLD vancomycin today. With HD sessions being at lower BFRs, patient will likely need 2 sessions before another dose of vancomycin is required Zosyn 3.375g IV q12h (infused over 4 hours) Will continue to follow HD schedule/tolerance, culture results, LOT, and antibiotic de-escalation plans   Height: 5\' 10"  (177.8 cm) Weight: 198 lb 10.2 oz (90.1 kg) IBW/kg (Calculated) : 73  Temp (24hrs), Avg:99.2 F (37.3 C), Min:98.1 F (36.7 C), Max:100.9 F (38.3 C)  Recent Labs  Lab 01/07/18 0726 01/08/18 0008 01/09/18 0500 01/09/18 1231 01/10/18 0548 01/11/18 0530 01/11/18 0720  WBC 10.3 10.0 14.4*  --  13.1* 14.0*  --   CREATININE 9.15* 9.92* 7.25*  --  9.18* 6.18*  --   LATICACIDVEN  --   --   --  1.5  --   --   --   VANCORANDOM  --   --   --   --   --   --  34    Estimated Creatinine Clearance: 13.3 mL/min (A) (by C-G formula based on SCr of 6.18 mg/dL (H)).    Allergies  Allergen Reactions  . Ceftriaxone Sodium In Dextrose Anaphylaxis    Tolerates Zosyn  . Ciprofloxacin Other (See Comments)    UNSPECIFIED PAIN  . Nsaids Other (See Comments)    HISTORY OF ULCER  . Sulfa Antibiotics Swelling    SWELLING REACTION UNSPECIFIED     Antimicrobials this admission: Vanc 6/6 >> Zosyn 6/6 >> 6/10; restart 6/16 >>  Microbiology results: 6/6 BCx: 1/2 coag neg staph 6/8: C Diff antigen/toxin negative 6/9 BCx: negF 6/10 BCx: negF 6/12 MRSA  PCR negative 6/16 BCx: ngtd  Thank you for allowing pharmacy to be a part of this patient's care.  [name] D. [name], PharmD, BCPS Clinical Pharmacist [phone] Please check AMION for all [facility] numbers 01/11/2018 8:44 AM

## 2018-01-11 NOTE — Progress Notes (Signed)
[facility] at [facility]. Pt to be transferred to room 215 around 230pm.  Paperwork completed by CM.

## 2018-01-11 NOTE — Progress Notes (Signed)
[facility] Progress Note   Subjective:   Seen on HD, 2.5L UF planned, but hypotensive so UF turned off. No CP/dyspnea. Answering simple questions.  Objective Vitals:   01/11/18 0800 01/11/18 0830 01/11/18 0900 01/11/18 0915  BP: 103/63 96/66 (!) 88/61 (!) 90/59  Pulse: 79 80 80 79  Resp:      Temp:      TempSrc:      SpO2:      Weight:      Height:       Physical Exam General: Elderly man, NAD Heart: RRR; no murmur Lungs: CTA anteriorly Abdomen: soft, non-tender Extremities: Trace stump edema and RUE edema Dialysis Access: R femoral temp cath in place, L IJ cath removed.  Additional Objective Labs: Basic Metabolic Panel: Recent Labs  Lab 01/05/18 0820 01/06/18 0636  01/08/18 0459 01/09/18 0500 01/10/18 0548 01/11/18 0530  NA 138 137   < >  --  136 136 135  K 3.9 4.2   < >  --  3.9 4.0 4.1  CL 99* 99*   < >  --  98* 97* 96*  CO2 26 26   < >  --  26 26 26   GLUCOSE 135* 152*   < >  --  169* 206* 202*  BUN 51* 69*   < >  --  64* 86* 58*  CREATININE 6.15* 7.66*   < >  --  7.25* 9.18* 6.18*  CALCIUM 9.0 8.9   < >  --  8.9 9.1 9.1  PHOS 1.0* 2.4*  --  3.5  --   --   --    < > = values in this interval not displayed.   Liver Function Tests: Recent Labs  Lab 01/05/18 0820 01/10/18 0548  AST  --  13*  ALT  --  12*  ALKPHOS  --  53  BILITOT  --  0.6  PROT  --  7.8  ALBUMIN 2.1* 2.1*   CBC: Recent Labs  Lab 01/07/18 0726 01/08/18 0008 01/09/18 0500 01/10/18 0548 01/11/18 0530  WBC 10.3 10.0 14.4* 13.1* 14.0*  HGB 9.2* 9.3* 9.0* 8.6* 8.7*  HCT 28.9* 29.5* 28.6* 27.0* 28.1*  MCV 87.8 88.1 86.7 86.5 88.9  PLT 498* 544* 499* 498* 457*   Blood Culture    Component Value Date/Time   SDES BLOOD LEFT WRIST 01/09/2018 1249   SPECREQUEST  01/09/2018 1249    BOTTLES DRAWN AEROBIC AND ANAEROBIC Blood Culture adequate volume   CULT  01/09/2018 1249    NO GROWTH < 24 HOURS Performed at [facility], [place] [address], [place], [place]  [address]    REPTSTATUS PENDING 01/09/2018 1249   Studies/Results: Dg Chest Port 1 View  Result Date: 01/09/2018 CLINICAL DATA:  Pneumonia EXAM: PORTABLE CHEST 1 VIEW COMPARISON:  Chest x-rays dated 01/07/2018 and 01/02/2018. FINDINGS: The femoral dialysis catheter is stable in position with extension through the RIGHT atrium and into the lower SVC. Heart size and mediastinal contours are stable. No evidence of pneumonia on today's exam. No pleural effusion or pneumothorax seen. IMPRESSION: 1. No evidence of pneumonia on today's exam. 2. Femoral dialysis catheter is stable in position, extending through the RIGHT atrium to the level of the lower SVC. As indicated on the earlier chest x-ray report, would consider retracting approximately 6 cm for optimal radiographic positioning. Electronically Signed   By: Franki Cabot M.D.   On: 01/09/2018 13:52   Medications: . sodium chloride 10 mL/hr at  01/07/18 1900  . heparin 1,700 Units/hr (01/10/18 2318)  . piperacillin-tazobactam (ZOSYN)  IV Stopped (01/11/18 0059)   . amiodarone  200 mg Per Tube Daily  . atorvastatin  40 mg Per Tube QHS  . Chlorhexidine Gluconate Cloth  6 each Topical Q0600  . darbepoetin (ARANESP) injection - DIALYSIS  150 mcg Intravenous Q Tue-HD  . famotidine  20 mg Oral QHS  . feeding supplement (NEPRO CARB STEADY)  1,000 mL Per Tube Q24H  . feeding supplement (PRO-STAT SUGAR FREE 64)  30 mL Per Tube TID  . insulin aspart  0-9 Units Subcutaneous Q4H  . latanoprost  1 drop Both Eyes QHS  . mouth rinse  15 mL Mouth Rinse BID  . metoprolol tartrate  12.5 mg Per Tube BID  . multivitamin  1 tablet Oral QHS  . Warfarin - Pharmacist Dosing Inpatient   Does not apply q1800    Dialysis Orders: [place] [facility] 4h 180NRe BFR 400/800 EDW 88kg 2K/2Ca Profile 4 Na Linear L TDC Heparin 4000 Bolus, 2000 mid - Venofer 100mg  IV x 10 (to start 6/8)  Assessment/Plan: 1. Severe sepsis/CoNS bacteremia (12/30/17): RLL infiltrates on chest CT. Has  been on Vanc; Zosyn resumed 6/16 d/t ongoing fever. Using temp HD line, repeat BCx 6/16 pending.  2. ESRD: Continue HD per TTS schedule. S/p extra HD 6/17 for volume. 3. Hypotension/Volume: Previously on midodrine BP support; ?looks like discontinued on 6/13 - may need to resume. UF as tolerated.  4. Anemia of CKD: Hgb down to 8.6, for Aranesp 111mcg on 0/99. 5. Metabolic bone disease. Ca/Phos better. Phos was low. Binder (phoslo) on hold. 6. Nutrition: Alb low, continue pro-stat. 7. AFib. Chronic Coumadin and IV heparin per pharmacy. 8. DM 9. Encephalopathy: Ongoing issue, stable. 10. Chronic stump wounds: Per primary.    Veneta Penton, PA-C 01/11/2018, 9:26 AM  [facility] Pager: [phone]

## 2018-01-11 NOTE — Progress Notes (Signed)
[place] for Heparin and warfarin Indication: PAF with h/o CVA  Patient Measurements: Height: 5\' 10"  (177.8 cm) Weight: 198 lb 10.2 oz (90.1 kg) IBW/kg (Calculated) : 73 Heparin Dosing Weight: 90 kg  Vital Signs: Temp: 99.4 F (37.4 C) (06/18 0705) Temp Source: Axillary (06/18 0705) BP: 103/63 (06/18 0800) Pulse Rate: 79 (06/18 0800)  Labs: Recent Labs    01/09/18 0500 01/09/18 1210 01/10/18 0548 01/11/18 0530  HGB 9.0*  --  8.6* 8.7*  HCT 28.6*  --  27.0* 28.1*  PLT 499*  --  498* 457*  LABPROT 17.1*  --  19.7* 23.4*  INR 1.41  --  1.69 2.10  HEPARINUNFRC 0.32 0.34 0.41 0.53  CREATININE 7.25*  --  9.18* 6.18*   Estimated Creatinine Clearance: 13.3 mL/min (A) (by C-G formula based on SCr of 6.18 mg/dL (H)).  Assessment: 19 YOM on warfarin PTA for hx Afib and CVA - reversed with Vit K on 6/8 and again 6/13, was on Heparin bridge while awaiting HD access - placed 6/14. Heparin resumed 6/15 and warfarin resumed yesterday evening.  Heparin level remains therapeutic at 0.53units/mL, INR trending up 2.1. No bleeding noted, CBC is stable. Due for Aranesp today with HD.  Goal of Therapy:  Heparin level 0.3-0.7 units/ml Monitor platelets by anticoagulation protocol: Yes   Plan:  Continue Heparin at 1700 units/hr (17 ml/hr)- recommend continuing this for one more day to ensure INR remains therapeutic since it has fluctuated during admission and patient has received multiple doses of vitamin K Warfarin 2.5mg  po x1 tonight  Daily heparin level, INR, and CBC Will continue to monitor for any signs/symptoms of bleeding  Abeni Finchum D. Mikhai Bienvenue, PharmD, BCPS Clinical Pharmacist [phone] Please check AMION for all [place] numbers 01/11/2018 8:39 AM

## 2018-01-11 NOTE — Discharge Instructions (Signed)
Toxic Metabolic Encephalopathy °Toxic metabolic encephalopathy (TME) is a type of brain disorder caused by a change in brain chemistry. This condition may result from illnesses or conditions that cause an imbalance of fluid, minerals (electrolytes), and other substances in the body that affect the way the brain functions. It is not caused by brain damage or brain disease. °TME can cause confusion and other mental disturbances, which are generally referred to as delirium. Untreated delirium may lead to permanent mental changes or worsening medical conditions. Untreated delirium is a life-threatening condition that may need to be treated in the hospital. °What are the causes? °Possible causes of TME that can lead to delirium include: °· Short-term (acute) or long-term (chronic) disease of the kidney or liver. °· Not having enough fluid in the body (dehydration). °· Changes in the acid level (pH) of the blood. °· High or low levels of any of the following substances in the blood: °? Calcium. °? Salt (sodium). °? Sugar (glucose). °? Magnesium. °? Phosphate. °· High body temperature. °· Not having enough oxygen in the blood. °· Low levels of B vitamins. This can result from alcohol abuse. °· Certain medicines, such as steroids and medicines that reduce the activity of the immune system (immunosuppressants). °· Certain infections. ° °What increases the risk? °You may have a higher risk for TME if you: °· Are elderly. °· Have dementia. °· Are in the hospital, especially in intensive care. °· Live in a nursing home. °· Had recent surgery. °· Have liver or kidney disease. °· Have poorly controlled diabetes. °· Have chronic medical problems, especially heart or lung disease. °· Are not getting enough fluids. °· Have poor nutrition. °· Abuse alcohol. ° °What are the signs or symptoms? °Symptoms of TME may include: °· Muscle stiffness or jerking (spasticity). °· Shaking (tremors). °· Flapping of the  hands. °· Weakness. °· Clumsiness. °· Slowed breathing. °· Jerky movements that you cannot control (seizures). °· Not being able to stay awake (drowsiness). °· Not being able to pay attention. °· Loss of consciousness (coma). ° °Symptoms of delirium caused by TME include: °· Confusion. °· Difficulty focusing or concentrating, or inability to focus or concentrate. °· Not knowing where you are (disorientation). °· Seeing or hearing things that are not real (hallucinations). °· Fearfulness. °· False beliefs (delusions). °· Changes in mood or personality. °· Changes in speech, such as saying things that do not make sense. °· Memory loss. °· Irritability. °· Avoiding other people (withdrawal). °· Depression. °· Poor judgment. °· Changes in eating and sleeping patterns. °· Hyperactivity. °· Decreased alertness. °· General mistrust of others (paranoia). ° °Delirium may come and go. Symptoms of delirium may start suddenly or gradually, and they often get worse at night. °How is this diagnosed? °This condition is diagnosed based on: °· Your symptoms and behavior. °· An exam to check how you are thinking, feeling, and behaving (mental status exam). To diagnose delirium, the mental status exam must rule out other possible causes of TME, and must show: °? Changes in attention and awareness. °? Changes that develop over a short period of time and tend to come and go (fluctuate). °? Changes in memory, language, and thinking that were not present before. °· A physical exam. °· Imaging tests, such as: °? MRI. °? CT scan. °· Blood tests to: °? Measure liver and kidney function. °? Check for a lack (deficiency) of vitamin B. °? Check for changes in acid levels (pH) and changes in calcium, sodium, or magnesium levels   in the blood. °? Measure your blood sugar (glucose). °? Measure your blood oxygen level. ° °How is this treated? °Treatment for TME depends on the cause, and it may include. °· Getting fluids through an IV  tube. °· Regulating calcium, sodium, glucose, or magnesium levels in the body. °· Getting oxygen. °· Improving nutrition. °· Treating liver or kidney disease. °· Adjusting certain medicines. °· Treating infections. ° °If the cause is found and treated, delirium usually improves. Managing delirium may include: °· Keeping the room well-lit and quiet. °· Using calendars, pictures, and clocks to prevent disorientation. °· Having frequent checks from nursing staff and visits from caregivers. °· Wearing eyeglasses or a hearing aid, if needed. °· Physical therapy. °· Medicine to treat agitation, anxiety, hallucinations, or delusions. ° °Follow these instructions at home: °· Drink enough fluid to keep your urine clear or pale yellow. °· Take over-the-counter and prescription medicines only as told by your health care provider. °· Return to your normal activities as told by your health care provider. Ask your health care provider what activities are safe for you. °· Follow a healthy diet. Do not skip meals. °· Do not drink alcohol. °· Go to bed at the same time every night. °· Keep all follow-up visits as told by your health care provider. This is important. °Contact a health care provider if: °· You are unable to feed yourself or hydrate yourself. °· You need help at home. °· You start to feel clumsy. °· You start to have tremors or weakness. °Get help right away if: °· You have a seizure. °· You lose consciousness. °· You have trouble breathing. °· You do not feel able to care for yourself at home. °· You have a fever. °· You become disoriented at home. °This information is not intended to replace advice given to you by your health care provider. Make sure you discuss any questions you have with your health care provider. °Document Released: 12/20/2015 Document Revised: 03/16/2016 Document Reviewed: 12/20/2015 °Elsevier Interactive Patient Education © 2018 [employer] ° °

## 2018-01-11 NOTE — Progress Notes (Signed)
Dressing to [place] intact-changed per order.  No pressure ulcer noted. No open areas noted. Incision intact. RBKA without a dressing.  Dressing to L anterior leg intact. Not changed at this time.

## 2018-01-12 ENCOUNTER — Telehealth: Payer: Self-pay | Admitting: *Deleted

## 2018-01-12 NOTE — Telephone Encounter (Signed)
Pt was on TCM report admitted 12/30/17 for sepsis and persistingencephalopathy.SIRS v/s Sepsis of unclear etiology. No true fever since 6/16 - all cultures negative thus far w/ no clinical evidence of an active infection - abx being stopped at time of d/c 01/11/18 to D/C to [facility] for closer monitoring for recurrent SIRS/Sepsis. Summary did not specify if pt will need to f/u w/PCP once he has been D/C.Marland KitchenJohny Chess

## 2018-01-14 LAB — CULTURE, BLOOD (ROUTINE X 2)
CULTURE: NO GROWTH
Culture: NO GROWTH
SPECIAL REQUESTS: ADEQUATE
Special Requests: ADEQUATE

## 2018-01-24 DEATH — deceased

## 2018-04-13 ENCOUNTER — Ambulatory Visit: Payer: Self-pay | Admitting: Internal Medicine

## 2018-05-13 NOTE — Telephone Encounter (Signed)
error 

## 2018-05-17 ENCOUNTER — Encounter: Payer: Self-pay | Admitting: Cardiology

## 2019-06-16 ENCOUNTER — Telehealth: Payer: Self-pay

## 2019-06-16 NOTE — Telephone Encounter (Signed)
Attempted to contact pt to schedule in office appt, no answer.

## 2019-09-05 ENCOUNTER — Telehealth: Payer: Self-pay | Admitting: Internal Medicine

## 2019-09-05 NOTE — Telephone Encounter (Signed)
Patient wife called and stated patient passed away over a year and half ago and she just got a letter from our office. Can we please have his charted marked.   Passed February 16, 2018

## 2020-03-22 IMAGING — MR MR HEAD W/O CM
10 of 12 series · 33 of 48 positions shown · non-contrast
Comparison: Head CT without contrast 11/21/2017 and earlier. Brain
MRI 12/27/2015.

CLINICAL DATA: 67-year-old male with confusion. Code stroke
presentation yesterday with right side facial droop.

EXAM:
MRI HEAD WITHOUT CONTRAST
TECHNIQUE: Multiplanar, multiecho pulse sequences of the brain and surrounding
structures were obtained without intravenous contrast.

[Series 1: loc · axial · 6.0mm · 0.59mm/px · z∈[-22,+150]mm · 3 of 19 slices shown]
[im 1/19]
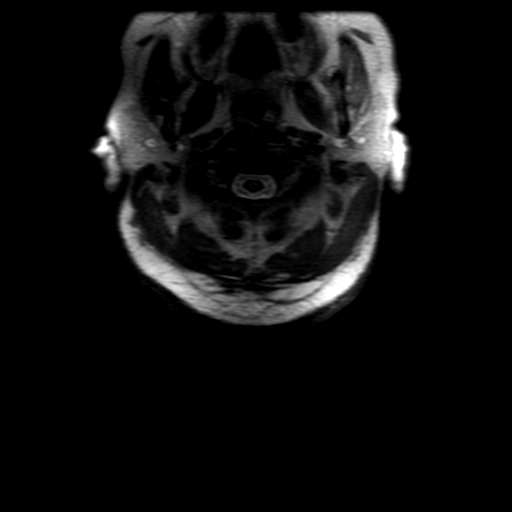
[im 10/19]
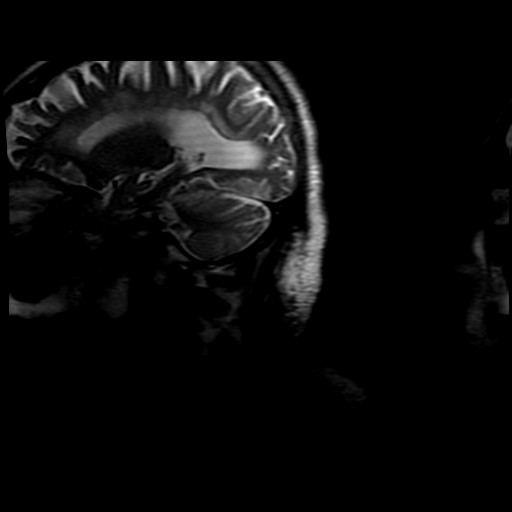
[im 19/19]
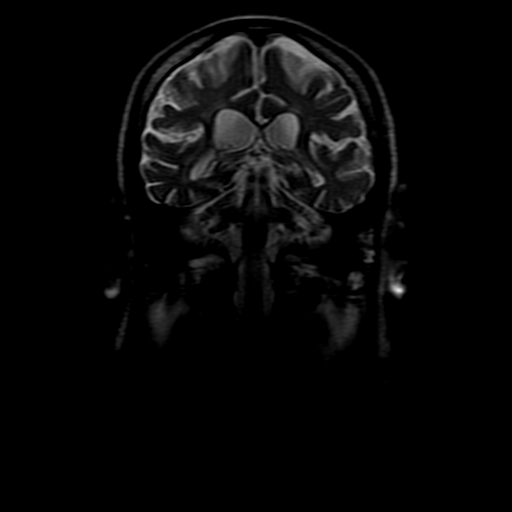

[Series 3: DWI · axial · 3.0mm · 1.09mm/px · z∈[-12,+132]mm · 8 of 98 slices shown (1 of 4)]
[im 1/98]
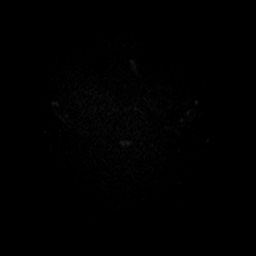
[im 14/98]
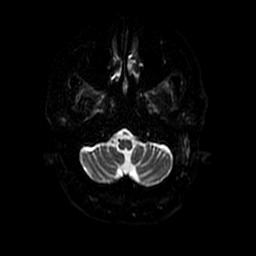
[im 28/98]
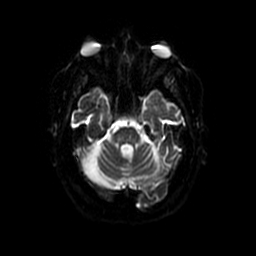
[im 42/98]
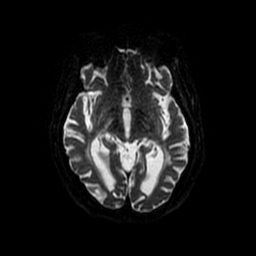
[im 56/98]
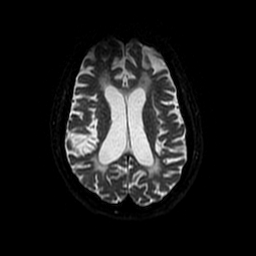
[im 70/98]
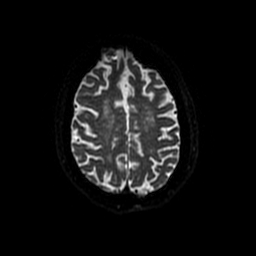
[im 84/98]
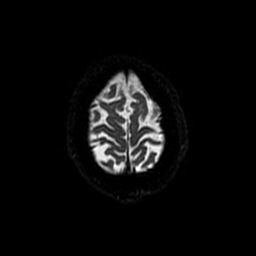
[im 98/98]
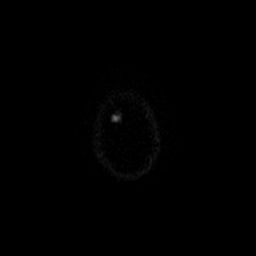

[Series 4: DWI · coronal · 5.0mm · 1.09mm/px · 6 of 70 slices shown (2 of 4)]
[im 1/70]
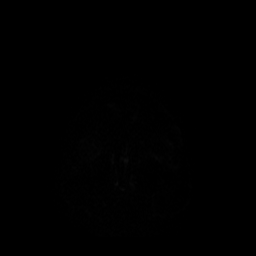
[im 14/70]
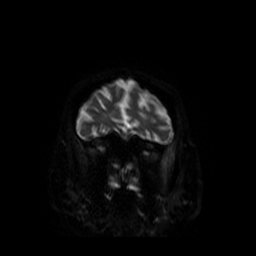
[im 28/70]
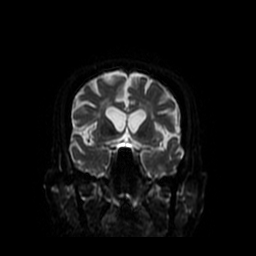
[im 42/70]
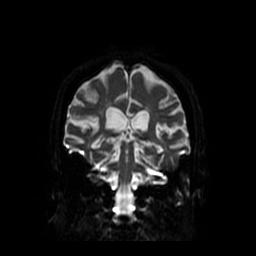
[im 56/70]
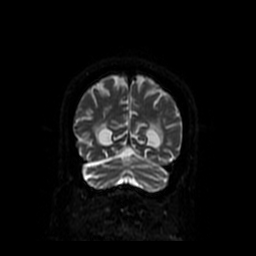
[im 70/70]
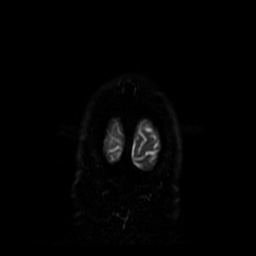

[Series 5: T1 · sagittal · 5.0mm · 0.47mm/px · 2 of 23 slices shown]
[im 1/23]
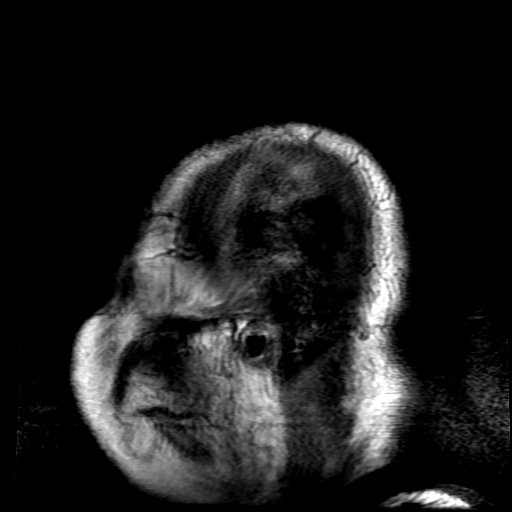
[im 23/23]
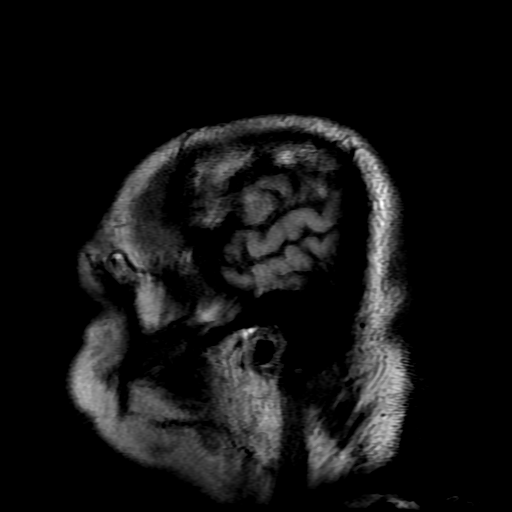

[Series 6: T2 · axial · 5.0mm · 0.43mm/px · z∈[-32,+129]mm · 2 of 28 slices shown (1 of 2)]
[im 1/28]
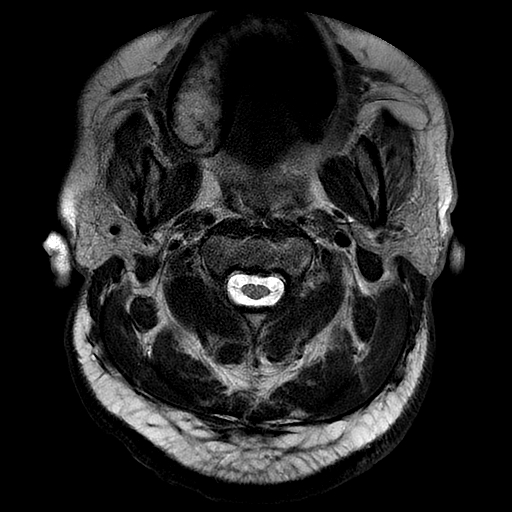
[im 28/28]
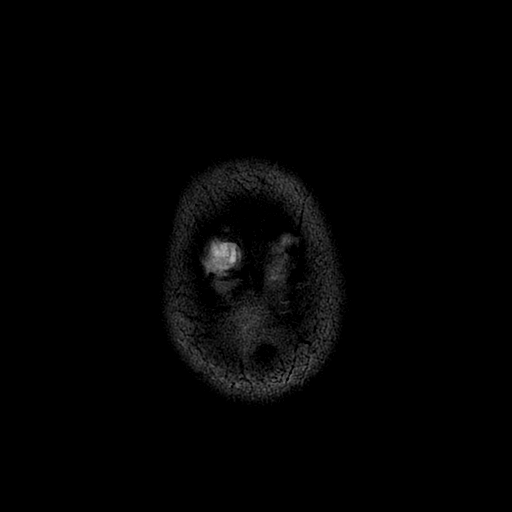

[Series 7: FLAIR · axial · 5.0mm · 0.43mm/px · z∈[-32,+129]mm · 2 of 28 slices shown]
[im 1/28]
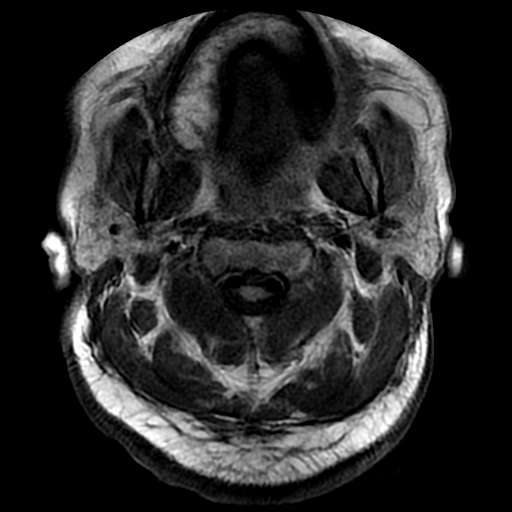
[im 28/28]
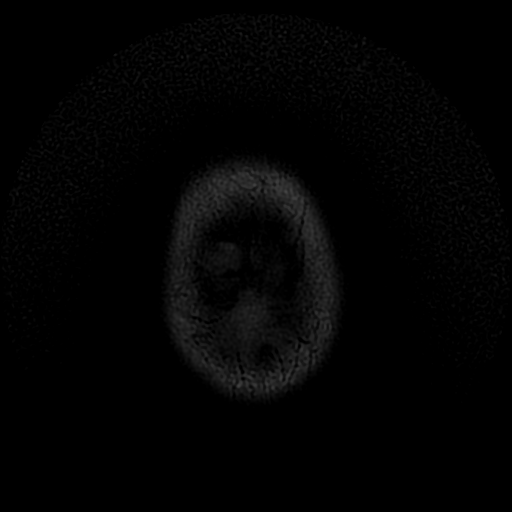

[Series 8: T2 · coronal · 5.0mm · 0.43mm/px · 2 of 29 slices shown (2 of 2)]
[im 1/29]
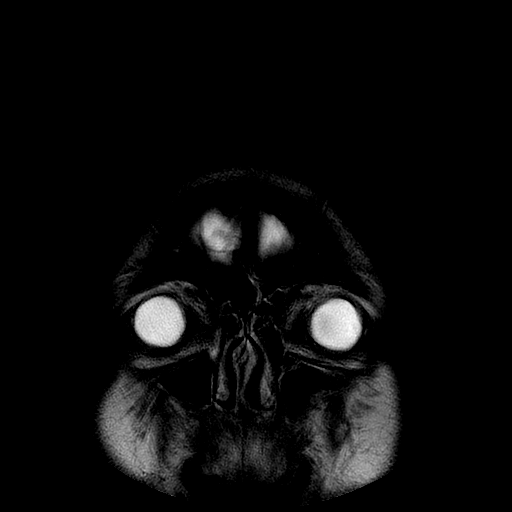
[im 29/29]
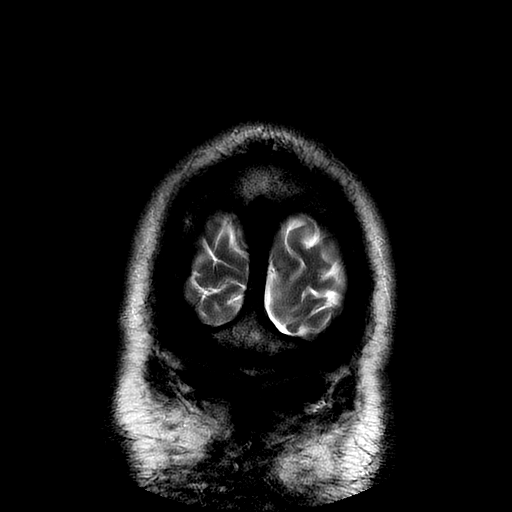

[Series 9: ax mpgr · axial · 5.0mm · 0.45mm/px · 1 of 22 slices shown]
[im 1/22]
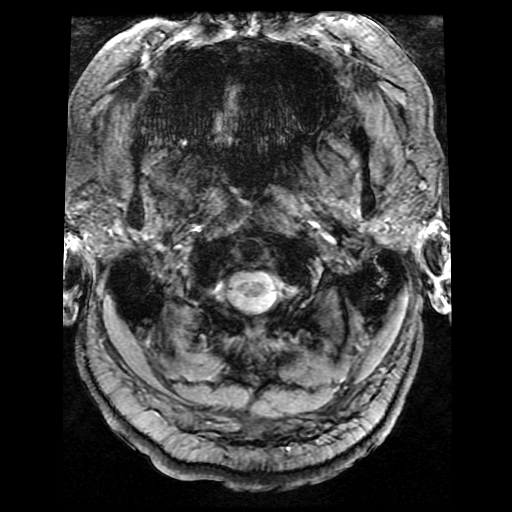

[Series 300: DWI · axial · 3.0mm · 1.09mm/px · z∈[-12,+132]mm · 4 of 49 slices shown (3 of 4)]
[im 1/49]
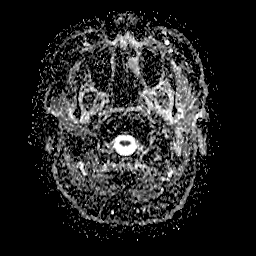
[im 17/49]
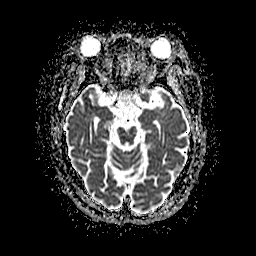
[im 33/49]
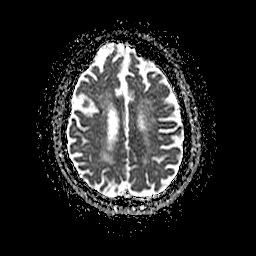
[im 49/49]
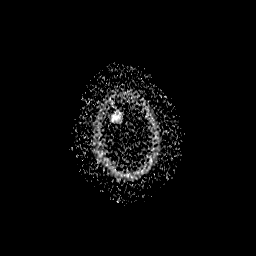

[Series 400: DWI · coronal · 5.0mm · 1.09mm/px · 3 of 35 slices shown (4 of 4)]
[im 1/35]
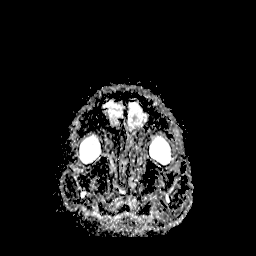
[im 18/35]
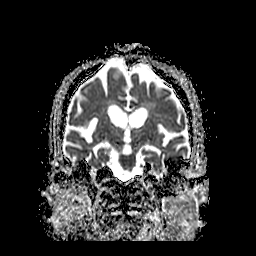
[im 35/35]
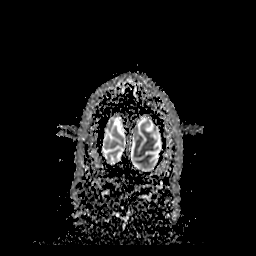

[33 of 48 positions shown; findings below may reference images not displayed]

FINDINGS: Brain: No restricted diffusion to suggest acute infarction. No
midline shift, mass effect, evidence of mass lesion,
ventriculomegaly, extra-axial collection or acute intracranial
hemorrhage. Cervicomedullary junction and pituitary are within
normal limits.

Stable cerebral volume since 3079 with disproportionate atrophy of
the brainstem. There is stable T2 heterogeneity throughout the mid
and dorsal pons compatible with chronic lacunar infarcts. Cerebellar
volume appears better preserved. Advanced chronic lacunar infarcts
in the bilateral thalami some with associated chronic
microhemorrhage. The basal ganglia are relatively spared. There is
chronic Patchy and confluent supratentorial cerebral white matter T2
and FLAIR hyperintensity. No new signal abnormality compared to the
3079 MRI.

Vascular: Major intracranial vascular flow voids are stable.

Skull and upper cervical spine: Stable and negative. Normal bone
marrow signal.

Sinuses/Orbits: Interval postoperative changes to the right globe.
Otherwise stable and negative.

Other: A mild-to-moderate left mastoid effusion is new since 1720.
Negative nasopharynx. The right mastoids remain clear. Grossly
normal other visible internal auditory structures. Scalp and face
soft tissues appear negative.
IMPRESSION: 1.  No acute intracranial abnormality.
2. Chronic brainstem atrophy with evidence of advanced chronic small
vessel disease in the thalami and pons appears stable since a 3079
MRI.
3. Left mastoid effusion is new since 1720 and probably
postinflammatory.

## 2020-05-09 ENCOUNTER — Emergency Department: Admit: 2020-05-10 | Payer: PRIVATE HEALTH INSURANCE

## 2020-05-09 ENCOUNTER — Emergency Department: Admit: 2020-05-09 | Payer: PRIVATE HEALTH INSURANCE

## 2020-05-09 ENCOUNTER — Inpatient Hospital Stay
Admission: EM | Admit: 2020-05-10 | Discharge: 2020-05-10 | Disposition: A | Discharge status: Other Acute Inpatient Hospital | Payer: PRIVATE HEALTH INSURANCE | Admitting: Emergency Medicine

## 2020-05-09 DIAGNOSIS — R42 Dizziness and giddiness: Secondary | ICD-10-CM

## 2020-05-09 LAB — COMPREHENSIVE METABOLIC PANEL
ALT: 26 U/L (ref 0–40)
AST: 25 U/L (ref 0–39)
Albumin: 4.4 g/dL (ref 3.5–5.2)
Alkaline Phosphatase: 88 U/L (ref 40–129)
Anion Gap: 13 mmol/L (ref 7–16)
BUN: 21 mg/dL (ref 6–23)
CO2: 19 mmol/L — ABNORMAL LOW (ref 22–29)
Calcium: 9.9 mg/dL (ref 8.6–10.2)
Chloride: 103 mmol/L (ref 98–107)
Creatinine: 1.1 mg/dL (ref 0.7–1.2)
GFR African American: >60
GFR Non-African American: >60 mL/min/{1.73_m2} (ref >=60)
Glucose: 122 mg/dL — ABNORMAL HIGH (ref 74–99)
Potassium: 4.1 mmol/L (ref 3.5–5.0)
Sodium: 135 mmol/L (ref 132–146)
Total Bilirubin: 0.7 mg/dL (ref 0.0–1.2)
Total Protein: 7 g/dL (ref 6.4–8.3)

## 2020-05-09 LAB — CBC WITH AUTO DIFFERENTIAL
Basophils %: 0.2 % (ref 0.0–2.0)
Basophils Absolute: 0.03 E9/L (ref 0.00–0.20)
Eosinophils %: 0.9 % (ref 0.0–6.0)
Eosinophils Absolute: 0.11 E9/L (ref 0.05–0.50)
Hematocrit: 44 % (ref 37.0–54.0)
Hemoglobin: 15.2 g/dL (ref 12.5–16.5)
Immature Granulocytes #: 0.04 E9/L
Immature Granulocytes %: 0.3 % (ref 0.0–5.0)
Lymphocytes %: 28.1 % (ref 20.0–42.0)
Lymphocytes Absolute: 3.42 E9/L (ref 1.50–4.00)
MCH: 32.2 pg (ref 26.0–35.0)
MCHC: 34.5 % (ref 32.0–34.5)
MCV: 93.2 fL (ref 80.0–99.9)
MPV: 10.3 fL (ref 7.0–12.0)
Monocytes %: 8 % (ref 2.0–12.0)
Monocytes Absolute: 0.98 E9/L — ABNORMAL HIGH (ref 0.10–0.95)
Neutrophils %: 62.5 % (ref 43.0–80.0)
Neutrophils Absolute: 7.61 E9/L — ABNORMAL HIGH (ref 1.80–7.30)
Platelets: 198 E9/L (ref 130–450)
RBC: 4.72 E12/L (ref 3.80–5.80)
RDW: 12.6 fL (ref 11.5–15.0)
WBC: 12.2 E9/L — ABNORMAL HIGH (ref 4.5–11.5)

## 2020-05-09 LAB — EKG 12-LEAD
Atrial Rate: 107 {beats}/min
Q-T Interval: 394 ms
QRS Duration: 110 ms
QTc Calculation (Bazett): 451 ms
R Axis: 62 degrees
T Axis: 57 degrees
Ventricular Rate: 79 {beats}/min

## 2020-05-09 LAB — TROPONIN: Troponin, High Sensitivity: 12 ng/L — ABNORMAL HIGH (ref 0–11)

## 2020-05-09 NOTE — ED Provider Notes (Addendum)
HPI:  05/09/20, Time: 8:18 PM EDT         Dylan Beasley is a 69 y.o. male presenting to the ED for dizziness with loss of balance, beginning days ago.  The complaint has been persistent, moderate in severity, and worsened by nothing.  She reports symptoms started on Sunday.  Patient feeling dizzy and lightheaded.  Patient reports that his blood pressures been low at home.  Patient reporting decreased appetite.  Patient reporting no abdominal pain.  Patient reporting no fall or injury.  Patient is on Eliquis for atrial fibrillation he reports no new changes to his blood pressure medication.  Patient reports that he felt like he was in a pass out out in the waiting room.  Patient reporting no black or tarry stools he reports no fever chills or productive cough he does report some mild chest discomfort at times.    ROS:   Pertinent positives and negatives are stated within HPI, all other systems reviewed and are negative.  --------------------------------------------- PAST HISTORY ---------------------------------------------  Past Medical History:  has a past medical history of Atrial fibrillation (HCC), HLD (hyperlipidemia), Hypertension, and Inguinal hernia.    Past Surgical History:  has no past surgical history on file.    Social History:  reports that he has been smoking cigarettes. He has a 2.50 pack-year smoking history. He does not have any smokeless tobacco history on file. He reports current alcohol use. He reports that he does not use drugs.    Family History: family history is not on file.     The patient???s home medications have been reviewed.    Allergies: Patient has no known allergies.    ---------------------------------------------------PHYSICAL EXAM--------------------------------------    Constitutional/General: Alert and oriented x3, well appearing, non toxic in NAD  Head: Normocephalic and atraumatic  Eyes: PERRL, EOMI  Mouth: Oropharynx clear, handling secretions, no trismus  Neck: Supple, full  ROM, non tender to palpation in the midline, no stridor, no crepitus, no meningeal signs  Pulmonary: Lungs clear to auscultation bilaterally, no wheezes, rales, or rhonchi. Not in respiratory distress  Cardiovascular:  Regular rate.  Irregular rhythm. No murmurs, gallops, or rubs. 2+ distal pulses  Chest: no chest wall tenderness  Abdomen: Soft.  Non tender. Non distended.  +BS.  No rebound, guarding, or rigidity. No pulsatile masses appreciated.  Musculoskeletal: Moves all extremities x 4. Warm and well perfused, no clubbing, cyanosis, or edema. Capillary refill <3 seconds  Skin: warm and dry. No rashes.   Neurologic: GCS 15, CN 2-12 grossly intact, no focal deficits, symmetric strength 5/5 in the upper and lower extremities bilaterally  Psych: Normal Affect    -------------------------------------------------- RESULTS -------------------------------------------------  I have personally reviewed all laboratory and imaging results for this patient. Results are listed below.     LABS:  Results for orders placed or performed during the hospital encounter of 05/09/20   CBC auto differential   Result Value Ref Range    WBC 12.2 (H) 4.5 - 11.5 E9/L    RBC 4.72 3.80 - 5.80 E12/L    Hemoglobin 15.2 12.5 - 16.5 g/dL    Hematocrit 44.8 18.5 - 54.0 %    MCV 93.2 80.0 - 99.9 fL    MCH 32.2 26.0 - 35.0 pg    MCHC 34.5 32.0 - 34.5 %    RDW 12.6 11.5 - 15.0 fL    Platelets 198 130 - 450 E9/L    MPV 10.3 7.0 - 12.0 fL    Neutrophils %  62.5 43.0 - 80.0 %    Immature Granulocytes % 0.3 0.0 - 5.0 %    Lymphocytes % 28.1 20.0 - 42.0 %    Monocytes % 8.0 2.0 - 12.0 %    Eosinophils % 0.9 0.0 - 6.0 %    Basophils % 0.2 0.0 - 2.0 %    Neutrophils Absolute 7.61 (H) 1.80 - 7.30 E9/L    Immature Granulocytes # 0.04 E9/L    Lymphocytes Absolute 3.42 1.50 - 4.00 E9/L    Monocytes Absolute 0.98 (H) 0.10 - 0.95 E9/L    Eosinophils Absolute 0.11 0.05 - 0.50 E9/L    Basophils Absolute 0.03 0.00 - 0.20 E9/L   Comprehensive Metabolic Panel   Result  Value Ref Range    Sodium 135 132 - 146 mmol/L    Potassium 4.1 3.5 - 5.0 mmol/L    Chloride 103 98 - 107 mmol/L    CO2 19 (L) 22 - 29 mmol/L    Anion Gap 13 7 - 16 mmol/L    Glucose 122 (H) 74 - 99 mg/dL    BUN 21 6 - 23 mg/dL    CREATININE 1.1 0.7 - 1.2 mg/dL    GFR Non-African American >60 >=60 mL/min/1.73    GFR African American >60     Calcium 9.9 8.6 - 10.2 mg/dL    Total Protein 7.0 6.4 - 8.3 g/dL    Albumin 4.4 3.5 - 5.2 g/dL    Total Bilirubin 0.7 0.0 - 1.2 mg/dL    Alkaline Phosphatase 88 40 - 129 U/L    ALT 26 0 - 40 U/L    AST 25 0 - 39 U/L   Troponin   Result Value Ref Range    Troponin, High Sensitivity 12 (H) 0 - 11 ng/L   Troponin   Result Value Ref Range    Troponin, High Sensitivity 11 0 - 11 ng/L   EKG 12 Lead   Result Value Ref Range    Ventricular Rate 79 BPM    Atrial Rate 107 BPM    QRS Duration 110 ms    Q-T Interval 394 ms    QTc Calculation (Bazett) 451 ms    R Axis 62 degrees    T Axis 57 degrees       RADIOLOGY:  Interpreted by Radiologist.  CT HEAD WO CONTRAST   Final Result   No acute intracranial abnormality.      There is complete opacification of the right side of the septated sphenoid   sinus.  Isolated sphenoid sinusitis is relatively rare, however in this case,   there is reactive osteoneogenesis, with thickening and mild sclerosis of the   surrounding walls, suggestive of clinically significant chronic sinusitis.   No osseous erosions.  There is mild nonspecific deviation of the sphenoid   septum to the left anteroinferiorly, however no frank expansion seen in the   context of mucocele.  Recommend ENT referral for further evaluation.         XR CHEST (2 VW)   Final Result   No acute process.               EKG:  This EKG is signed and interpreted by me.    Rate: 79  Rhythm: Atrial fibrillation  Interpretation: non-specific EKG  Comparison: stable as compared to patient's most recent EKG    Did look at CT myself.  Did not see any obvious findings.  No signs of intracranial  hemorrhage.    -------------------------  NURSING NOTES AND VITALS REVIEWED ---------------------------   The nursing notes within the ED encounter and vital signs as below have been reviewed by myself.  BP 118/70    Pulse 84    Temp 98.3 ??F (36.8 ??C) (Oral)    Resp 18    Ht 6\' 4"  (1.93 m)    Wt 180 lb (81.6 kg)    SpO2 100%    BMI 21.91 kg/m??   Oxygen Saturation Interpretation: Normal    The patient???s available past medical records and past encounters were reviewed.        ------------------------------ ED COURSE/MEDICAL DECISION MAKING----------------------  Medications   0.9 % sodium chloride infusion ( IntraVENous New Bag 05/09/20 2220)             Medical Decision Making:    Patient presenting here because of dizziness and feeling off balance.  Patient reports blood pressures been running low since Sunday.  Patient reporting feeling like he is in a pass out today.  Patient reports he is on multiple medications for his blood pressure.  Patient reports no new medication change.  Patient reporting no shortness of breath he does report some mild chest discomfort.  Patient labs are reviewed orthostatics noted.  Patient is not orthostatic.  Patient rechecked and still does not feel well.  He does not feel comfortable going home.    Re-Evaluations:             Re-evaluation.  Patient???s symptoms show no change  Reevaluated unchanged.  But does not feel well enough to go home.    Consultations:             IM    Critical Care:         This patient's ED course included: a personal history and physicial eaxmination    This patient has been closely monitored during their ED course.    Counseling:   The emergency provider has spoken with the patient and discussed today???s results, in addition to providing specific details for the plan of care and counseling regarding the diagnosis and prognosis.  Questions are answered at this time and they are agreeable with the plan.       --------------------------------- IMPRESSION AND  DISPOSITION ---------------------------------    IMPRESSION  1. Dizziness    2. Near syncope        DISPOSITION  Disposition: Admit to telemetry  Patient condition is stable        NOTE: This report was transcribed using voice recognition software. Every effort was made to ensure accuracy; however, inadvertent computerized transcription errors may be present          Saturday, MD  05/09/20 2254       05/11/20, MD  05/09/20 [phone]

## 2020-05-09 NOTE — ED Notes (Signed)
Patient reporting chest pains at around 4:45 AM.  Patient reporting left-sided.  Patient also states that he is due for his blood pressure medication.  Repeat EKG was done EKG does show rhythm of atrial fibrillation heart rate is 76.  There is some hyperacute T waves but no acute ST elevation.  Patient made aware of findings nurse to release orders from admitting physician.     Dwain Sarna, MD  05/10/20 [phone]

## 2020-05-09 NOTE — ED Notes (Signed)
FIRST PROVIDER CONTACT ASSESSMENT NOTE                                                                                                Department of Emergency Medicine                                                      First Provider Note  05/09/20  2:29 PM EDT  NAME: Dylan Beasley  DOB: September 05, 1950  MRN: [id number]    Chief Complaint: Hypotension (dizziness, blurred vision, loss of balance, "last time i took my BP was 90/60" )      History of Present Illness:   Dylan Beasley is a 69 y.o. male who presents to the ED for hypotension.  Patient states also having dizziness, blurred vision and loss of balance.  Patient's blood pressure is running lower than usual.  Patient states he normally has "high blood pressure and has been on same medications for 9 years."  Patient is not a diabetic.  Patient has not fallen or hit his head.  Patient states is all been going on since this weekend    Focused Physical Exam:  VS:    ED Triage Vitals   BP Temp Temp Source Pulse Resp SpO2 Height Weight   05/09/20 1426 05/09/20 1426 05/09/20 1426 05/09/20 1400 05/09/20 1426 05/09/20 1400 05/09/20 1426 05/09/20 1426   92/64 98.3 ??F (36.8 ??C) Oral 78 18 97 % 6\' 4"  (1.93 m) 180 lb (81.6 kg)        General: Alert and in no apparent distress.    Medical History:  has a past medical history of Atrial fibrillation (HCC), HLD (hyperlipidemia), Hypertension, and Inguinal hernia.    Surgical History:  has no past surgical history on file.    Social History:  reports that he has been smoking cigarettes. He has a 2.50 pack-year smoking history. He does not have any smokeless tobacco history on file. He reports current alcohol use. He reports that he does not use drugs.    Family History: family history is not on file.    Allergies: Patient has no known allergies.     Initial Plan of Care:  Initiate Treatment-Testing, Proceed toTreatment Area When Bed Available for ED Attending/MLP to Continue Care    -------------------------------------------------END  OF FIRST PROVIDER CONTACT ASSESSMENT NOTE--------------------------------------------------------  Electronically signed by , PA-C   DD: 05/09/20       05/11/20, PA-C  05/09/20 1430

## 2020-05-09 NOTE — ED Notes (Signed)
Bed: HF  Expected date:   Expected time:   Means of arrival:   Comments:  triage     Lonell Face, RN  05/09/20 2019

## 2020-05-10 LAB — EKG 12-LEAD
Atrial Rate: 80 {beats}/min
Q-T Interval: 392 ms
QRS Duration: 108 ms
QTc Calculation (Bazett): 441 ms
R Axis: 56 degrees
T Axis: 53 degrees
Ventricular Rate: 76 {beats}/min

## 2020-05-10 LAB — TROPONIN: Troponin, High Sensitivity: 11 ng/L (ref 0–11)

## 2020-05-10 MED ORDER — POLYETHYLENE GLYCOL 3350 17 G PO PACK
17 g | Freq: Every day | ORAL | Status: DC | PRN
Start: 2020-05-10 — End: 2020-05-10

## 2020-05-10 MED ORDER — MECLIZINE HCL 12.5 MG PO TABS
12.5 MG | Freq: Three times a day (TID) | ORAL | Status: DC | PRN
Start: 2020-05-10 — End: 2020-05-10

## 2020-05-10 MED ORDER — PROMETHAZINE HCL 25 MG PO TABS
25 MG | Freq: Four times a day (QID) | ORAL | Status: DC | PRN
Start: 2020-05-10 — End: 2020-05-10

## 2020-05-10 MED ORDER — FENTANYL CITRATE (PF) 100 MCG/2ML IJ SOLN
100 MCG/2ML | INTRAMUSCULAR | Status: AC
Start: 2020-05-10 — End: 2020-05-10
  Administered 2020-05-10: 09:00:00 25

## 2020-05-10 MED ORDER — ACETAMINOPHEN 325 MG PO TABS
325 | Freq: Four times a day (QID) | ORAL | Status: DC | PRN
Start: 2020-05-10 — End: 2020-05-10

## 2020-05-10 MED ORDER — ACETAMINOPHEN 650 MG RE SUPP
650 | Freq: Four times a day (QID) | RECTAL | Status: DC | PRN
Start: 2020-05-10 — End: 2020-05-10

## 2020-05-10 MED ORDER — ONDANSETRON HCL 4 MG/2ML IJ SOLN
4 | Freq: Four times a day (QID) | INTRAMUSCULAR | Status: DC | PRN
Start: 2020-05-10 — End: 2020-05-10

## 2020-05-10 MED ORDER — SODIUM CHLORIDE 0.9 % IV SOLN
0.9 | INTRAVENOUS | Status: DC | PRN
Start: 2020-05-10 — End: 2020-05-10

## 2020-05-10 MED ORDER — FENTANYL CITRATE (PF) 100 MCG/2ML IJ SOLN
100 MCG/2ML | Freq: Once | INTRAMUSCULAR | Status: AC
Start: 2020-05-10 — End: 2020-05-10

## 2020-05-10 MED ORDER — VITAMIN D 50 MCG (2000 UT) PO TABS
50 MCG (2000 UT) | Freq: Every day | ORAL | Status: DC
Start: 2020-05-10 — End: 2020-05-10

## 2020-05-10 MED ORDER — APIXABAN 5 MG PO TABS
5 MG | Freq: Two times a day (BID) | ORAL | Status: DC
Start: 2020-05-10 — End: 2020-05-10
  Administered 2020-05-10: 13:00:00 5 mg via ORAL

## 2020-05-10 MED ORDER — SODIUM CHLORIDE 0.9 % IV SOLN
0.9 % | INTRAVENOUS | Status: DC
Start: 2020-05-10 — End: 2020-05-10
  Administered 2020-05-10: 02:00:00 via INTRAVENOUS

## 2020-05-10 MED ORDER — ISOSORBIDE MONONITRATE ER 30 MG PO TB24
30 MG | Freq: Every day | ORAL | Status: DC
Start: 2020-05-10 — End: 2020-05-10
  Administered 2020-05-10: 13:00:00 60 mg via ORAL

## 2020-05-10 MED ORDER — SPIRONOLACTONE 25 MG PO TABS
25 MG | Freq: Every day | ORAL | Status: DC
Start: 2020-05-10 — End: 2020-05-10

## 2020-05-10 MED ORDER — METOPROLOL SUCCINATE ER 25 MG PO TB24
25 MG | Freq: Two times a day (BID) | ORAL | Status: DC
Start: 2020-05-10 — End: 2020-05-10
  Administered 2020-05-10: 13:00:00 50 mg via ORAL

## 2020-05-10 MED ORDER — NORMAL SALINE FLUSH 0.9 % IV SOLN
0.9 | Freq: Two times a day (BID) | INTRAVENOUS | Status: DC
Start: 2020-05-10 — End: 2020-05-10

## 2020-05-10 MED ORDER — NORMAL SALINE FLUSH 0.9 % IV SOLN
0.9 % | INTRAVENOUS | Status: DC | PRN
Start: 2020-05-10 — End: 2020-05-10

## 2020-05-10 MED FILL — SPIRONOLACTONE 25 MG PO TABS: 25 mg | ORAL | Qty: 1

## 2020-05-10 MED FILL — METOPROLOL SUCCINATE ER 25 MG PO TB24: 25 mg | ORAL | Qty: 2

## 2020-05-10 MED FILL — ELIQUIS 5 MG PO TABS: 5 mg | ORAL | Qty: 1

## 2020-05-10 MED FILL — FENTANYL CITRATE (PF) 100 MCG/2ML IJ SOLN: 100 MCG/2ML | INTRAMUSCULAR | Qty: 2

## 2020-05-10 MED FILL — ISOSORBIDE MONONITRATE ER 30 MG PO TB24: 30 mg | ORAL | Qty: 2

## 2020-05-10 NOTE — H&P (Signed)
Hospitalist History & Physical      PCP: No primary care provider on file.    Date of Admission: 05/09/2020    Date of Service: Pt seen/examined on 05/09/2020     Chief Complaint:  had concerns including Hypotension (dizziness, blurred vision, loss of balance, "last time i took my BP was 90/60" started sunday night).    History Of Present Illness:    [name], a 70 y.o. year old male  who  has a past medical history of Atrial fibrillation ([place]), HLD (hyperlipidemia), Hypertension, and Inguinal hernia.   Presented to the emergency department with complaints of dizziness.  Also reporting that his blood pressure has been low at home.  Decreased appetite.  Denies abdominal pain, chest pain, fever, chills, black or tarry stools.  Vital signs in the emergency department within normal limits and stable.  Blood pressure was a bit soft at 92/64.  Lab workup really only notable for WBC of 12.2.  EKG shows rate controlled atrial fibrillation.  An attempt was made to discharge the patient home but he does not feel well enough to go home.  Medicine was consulted for admission.        Past Medical History:   Diagnosis Date   ??? Atrial fibrillation ([place])    ??? HLD (hyperlipidemia)    ??? Hypertension    ??? Inguinal hernia        No past surgical history on file.    Prior to Admission medications    Medication Sig Start Date End Date Taking? Authorizing Provider   apixaban (ELIQUIS) 5 MG TABS tablet Take 1 tablet by mouth 2 times daily 10/11/17   Neal Dy, DO   hydrALAZINE (APRESOLINE) 50 MG tablet Take 1 tablet by mouth every 8 hours 10/11/17   Neal Dy, DO   lisinopril (PRINIVIL;ZESTRIL) 40 MG tablet Take 1 tablet by mouth daily 10/12/17   Neal Dy, DO   carvedilol (COREG) 25 MG tablet Take 1 tablet by mouth 2 times daily (with meals) 10/11/17   Neal Dy, DO   amLODIPine (NORVASC) 5 MG tablet Take 1 tablet by mouth daily 10/11/17   Neal Dy, DO   spironolactone (ALDACTONE) 25 MG tablet Take 0.5  tablets by mouth daily 10/12/17   Neal Dy, DO   Vitamin D (CHOLECALCIFEROL) 1000 UNITS CAPS capsule Take 2,000 Units by mouth daily.    Historical Provider, MD   isosorbide mononitrate (IMDUR) 60 MG CR tablet Take 60 mg by mouth daily.    Historical Provider, MD         Allergies:  Patient has no known allergies.    Social History:    TOBACCO:   reports that he has been smoking cigarettes. He has a 2.50 pack-year smoking history. He does not have any smokeless tobacco history on file.  ETOH:   reports current alcohol use.    Family History:    Reviewed in detail and negative for DM, CAD, Cancer, CVA. Positive as follows"  No family history on file.    REVIEW OF SYSTEMS:   Pertinent positives as noted in the HPI. All other systems reviewed and negative.    PHYSICAL EXAM:  BP 118/70    Pulse 84    Temp 98.3 ??F (36.8 ??C) (Oral)    Resp 18    Ht _0  (1.93 m)    Wt 180 lb (81.6 kg)    SpO2 100%    BMI 21.91 kg/m??   General appearance: No  apparent distress, appears stated age and cooperative.  HEENT: Normal cephalic, atraumatic without obvious deformity. Pupils equal, round, and reactive to light.  Extra ocular muscles intact. Conjunctivae/corneas clear.  Neck: Supple, with full range of motion. No jugular venous distention. Trachea midline.  Respiratory: Clear to auscultation bilaterally  Cardiovascular: Regular rate and rhythm  Abdomen: Soft, nontender, nondistended  Musculoskeletal: No clubbing, cyanosis, edema of bilateral lower extremities. Brisk capillary refill.   Skin: Normal skin color.  No rashes or lesions.  Neurologic:  Neurovascularly intact without any focal sensory/motor deficits. Cranial nerves: II-XII intact, grossly non-focal.    Reviewed EKG and CXR personally      CBC:   Recent Labs     05/09/20  1432   WBC 12.2*   RBC 4.72   HGB 15.2   HCT 44.0   MCV 93.2   RDW 12.6   PLT 198     BMP:   Recent Labs     05/09/20  1432   NA 135   K 4.1   CL 103   CO2 19*   BUN 21   CREATININE 1.1      LFT:  Recent Labs     05/09/20  1432   PROT 7.0   ALKPHOS 88   ALT 26   AST 25   BILITOT 0.7     CE:  No results for input(s): CKTOTAL, TROPONINI in the last 72 hours.  PT/INR: No results for input(s): INR, APTT in the last 72 hours.  BNP: No results for input(s): BNP in the last 72 hours.  ESR: No results found for: SEDRATE  CRP:   Lab Results   Component Value Date    CRP 0.3 10/07/2017     D Dimer: No results found for: DDIMER   Folate and B12: No results found for: VITAMINB12, No results found for: FOLATE  Lactic Acid:   Lab Results   Component Value Date    LACTA 1.5 09/14/2014     Thyroid Studies:   Lab Results   Component Value Date    TSH 7.110 (H) 10/06/2017       Oupatient labs:  Lab Results   Component Value Date    CHOL 132 10/07/2017    TRIG 119 10/07/2017    HDL 45 10/07/2017    LDLCALC 63 10/07/2017    TSH 7.110 (H) 10/06/2017    INR 1.1 10/06/2017    LABA1C 6.1 (H) 10/07/2017       Urinalysis:    Lab Results   Component Value Date    NITRU Negative 09/14/2014    WBCUA 1-3 09/14/2014    BACTERIA RARE 09/14/2014    RBCUA NONE 09/14/2014    BLOODU Negative 09/14/2014    SPECGRAV >=1.030 09/14/2014    GLUCOSEU Negative 09/14/2014       Imaging:  XR CHEST (2 VW)    Result Date: 05/09/2020  EXAMINATION: TWO XRAY VIEWS OF THE CHEST 05/09/2020 3:37 pm COMPARISON: 10/07/2017 HISTORY: ORDERING SYSTEM PROVIDED HISTORY: hypotension TECHNOLOGIST PROVIDED HISTORY: Reason for exam:->hypotension What reading provider will be dictating this exam?->CRC FINDINGS: The lungs are without acute focal process.  There is no effusion or pneumothorax. The cardiomediastinal silhouette is without acute process. The osseous structures are without acute process.     No acute process.     CT HEAD WO CONTRAST    Result Date: 05/09/2020  EXAMINATION: CT OF THE HEAD WITHOUT CONTRAST  05/09/2020 10:02 pm TECHNIQUE: CT of the head was performed  without the administration of intravenous contrast. Dose modulation, iterative  reconstruction, and/or weight based adjustment of the mA/kV was utilized to reduce the radiation dose to as low as reasonably achievable. COMPARISON: None. HISTORY: ORDERING SYSTEM PROVIDED HISTORY: Evaluate intracranial abnormality TECHNOLOGIST PROVIDED HISTORY: Has a "code stroke" or "stroke alert" been called?->No Reason for exam:->Evaluate intracranial abnormality Decision Support Exception - unselect if not a suspected or confirmed emergency medical condition->Emergency Medical Condition (MA) What reading provider will be dictating this exam?->CRC FINDINGS: BRAIN/VENTRICLES: There is no acute intracranial hemorrhage, mass effect or midline shift.  No abnormal extra-axial fluid collection.  The gray-white differentiation is maintained without evidence of an acute infarct.  There is no evidence of hydrocephalus. ORBITS: The visualized portion of the orbits demonstrate no acute abnormality. SINUSES: There is complete opacification of the right side of the septated sphenoid sinus.  There is mild mucosal thickening seen within the adjacent most posterior right ethmoid air cell.  Remaining paranasal sinuses and mastoid air cells within the field of view are clear bilaterally. SOFT TISSUES/SKULL:  No basilar skull or calvarial fracture.  No acute extracranial soft tissue abnormality is identified.     No acute intracranial abnormality. There is complete opacification of the right side of the septated sphenoid sinus.  Isolated sphenoid sinusitis is relatively rare, however in this case, there is reactive osteoneogenesis, with thickening and mild sclerosis of the surrounding walls, suggestive of clinically significant chronic sinusitis. No osseous erosions.  There is mild nonspecific deviation of the sphenoid septum to the left anteroinferiorly, however no frank expansion seen in the context of mucocele.  Recommend ENT referral for further evaluation.       ASSESSMENT:  -Dizziness/lightheadedness  -Hypotension  -Near  syncopal episode  -History of hypertension  -Chronic atrial fibrillation      PLAN:  -Admit to medicine  -IV fluids  -Meclizine as needed  -Hold blood pressure medications for now  -Physical therapy  -Continue the rest of his home medications        Diet: No diet orders on file  Code Status: Prior  Surrogate decision maker confirmed with patient:   Extended Emergency Contact Information  Primary Emergency Contact: [place] Phone: [phone]  Relation: Brother/Sister  Interpreter needed? No    DVT Prophylaxis: _0 Lovenox _1 Heparin _2 PCD _3  Warfarin/NOAC _4 Encouraged ambulation  Disposition: _5 Med/Surg _6  Intermediate _7  ICU/CCU  Admit status: _8  Observation _9  Inpatient     +++++++++++++++++++++++++++++++++++++++++++++++++  Serita Butcher, DO  [place], [place]  +++++++++++++++++++++++++++++++++++++++++++++++++  NOTE: This report was transcribed using voice recognition software. Every effort was made to ensure accuracy; however, inadvertent computerized transcription errors may be present.

## 2020-05-13 LAB — EKG 12-LEAD
Atrial Rate: 300 {beats}/min
Q-T Interval: 408 ms
QRS Duration: 116 ms
QTc Calculation (Bazett): 452 ms
R Axis: 59 degrees
T Axis: 59 degrees
Ventricular Rate: 74 {beats}/min

## 2021-07-27 DEATH — deceased
